# Patient Record
Sex: Female | Born: 1950
Health system: Southern US, Community
[De-identification: ages and names within clinical notes are randomized; demographics above are authoritative.]

## PROBLEM LIST (undated history)

## (undated) DIAGNOSIS — R51 Headache: Secondary | ICD-10-CM

## (undated) DIAGNOSIS — D126 Benign neoplasm of colon, unspecified: Secondary | ICD-10-CM

## (undated) DIAGNOSIS — H8120 Vestibular neuronitis, unspecified ear: Secondary | ICD-10-CM

## (undated) DIAGNOSIS — I10 Essential (primary) hypertension: Secondary | ICD-10-CM

## (undated) DIAGNOSIS — M1990 Unspecified osteoarthritis, unspecified site: Secondary | ICD-10-CM

## (undated) DIAGNOSIS — N186 End stage renal disease: Secondary | ICD-10-CM

## (undated) DIAGNOSIS — J189 Pneumonia, unspecified organism: Secondary | ICD-10-CM

## (undated) DIAGNOSIS — Z992 Dependence on renal dialysis: Secondary | ICD-10-CM

## (undated) DIAGNOSIS — T82898A Other specified complication of vascular prosthetic devices, implants and grafts, initial encounter: Secondary | ICD-10-CM

## (undated) DIAGNOSIS — I639 Cerebral infarction, unspecified: Secondary | ICD-10-CM

## (undated) DIAGNOSIS — I2510 Atherosclerotic heart disease of native coronary artery without angina pectoris: Secondary | ICD-10-CM

## (undated) DIAGNOSIS — K5900 Constipation, unspecified: Secondary | ICD-10-CM

## (undated) DIAGNOSIS — F418 Other specified anxiety disorders: Secondary | ICD-10-CM

## (undated) DIAGNOSIS — G709 Myoneural disorder, unspecified: Secondary | ICD-10-CM

## (undated) DIAGNOSIS — R269 Unspecified abnormalities of gait and mobility: Secondary | ICD-10-CM

## (undated) DIAGNOSIS — D649 Anemia, unspecified: Secondary | ICD-10-CM

## (undated) DIAGNOSIS — R2990 Unspecified symptoms and signs involving the nervous system: Secondary | ICD-10-CM

## (undated) DIAGNOSIS — R519 Headache, unspecified: Secondary | ICD-10-CM

## (undated) DIAGNOSIS — E785 Hyperlipidemia, unspecified: Secondary | ICD-10-CM

## (undated) DIAGNOSIS — I5033 Acute on chronic diastolic (congestive) heart failure: Secondary | ICD-10-CM

## (undated) DIAGNOSIS — I509 Heart failure, unspecified: Secondary | ICD-10-CM

## (undated) DIAGNOSIS — T82510A Breakdown (mechanical) of surgically created arteriovenous fistula, initial encounter: Secondary | ICD-10-CM

## (undated) DIAGNOSIS — K922 Gastrointestinal hemorrhage, unspecified: Secondary | ICD-10-CM

## (undated) DIAGNOSIS — R634 Abnormal weight loss: Secondary | ICD-10-CM

## (undated) DIAGNOSIS — E7800 Pure hypercholesterolemia, unspecified: Secondary | ICD-10-CM

## (undated) DIAGNOSIS — K219 Gastro-esophageal reflux disease without esophagitis: Secondary | ICD-10-CM

## (undated) DIAGNOSIS — R42 Dizziness and giddiness: Secondary | ICD-10-CM

## (undated) DIAGNOSIS — K5730 Diverticulosis of large intestine without perforation or abscess without bleeding: Secondary | ICD-10-CM

## (undated) DIAGNOSIS — M810 Age-related osteoporosis without current pathological fracture: Secondary | ICD-10-CM

## (undated) HISTORY — DX: Gastrointestinal hemorrhage, unspecified: K92.2

## (undated) HISTORY — DX: Unspecified abnormalities of gait and mobility: R26.9

## (undated) HISTORY — DX: Benign neoplasm of colon, unspecified: D12.6

## (undated) HISTORY — DX: Gastro-esophageal reflux disease without esophagitis: K21.9

## (undated) HISTORY — DX: Unspecified symptoms and signs involving the nervous system: R29.90

## (undated) HISTORY — DX: Unspecified osteoarthritis, unspecified site: M19.90

## (undated) HISTORY — DX: Myoneural disorder, unspecified: G70.9

## (undated) HISTORY — DX: Abnormal weight loss: R63.4

## (undated) HISTORY — DX: Dependence on renal dialysis: Z99.2

## (undated) HISTORY — PX: AV FISTULA PLACEMENT: SHX1204

## (undated) HISTORY — DX: Vestibular neuronitis, unspecified ear: H81.20

## (undated) HISTORY — DX: Atherosclerotic heart disease of native coronary artery without angina pectoris: I25.10

## (undated) HISTORY — DX: Anemia, unspecified: D64.9

## (undated) HISTORY — DX: Essential (primary) hypertension: I10

## (undated) HISTORY — DX: Diverticulosis of large intestine without perforation or abscess without bleeding: K57.30

## (undated) HISTORY — DX: Hyperlipidemia, unspecified: E78.5

## (undated) HISTORY — DX: Pure hypercholesterolemia, unspecified: E78.00

## (undated) HISTORY — PX: EYE SURGERY: SHX253

## (undated) HISTORY — PX: BACK SURGERY: SHX140

## (undated) HISTORY — DX: Dizziness and giddiness: R42

## (undated) HISTORY — DX: Age-related osteoporosis without current pathological fracture: M81.0

## (undated) HISTORY — PX: CERVICAL FUSION: SHX112

---

## 1994-12-14 HISTORY — PX: KIDNEY TRANSPLANT: SHX239

## 1998-05-11 ENCOUNTER — Ambulatory Visit (HOSPITAL_COMMUNITY): Admission: RE | Admit: 1998-05-11 | Discharge: 1998-05-11 | Payer: Self-pay | Admitting: Nephrology

## 1998-05-15 ENCOUNTER — Other Ambulatory Visit: Admission: RE | Admit: 1998-05-15 | Discharge: 1998-05-15 | Payer: Self-pay | Admitting: Nephrology

## 1998-06-06 ENCOUNTER — Other Ambulatory Visit: Admission: RE | Admit: 1998-06-06 | Discharge: 1998-06-06 | Payer: Self-pay | Admitting: *Deleted

## 1998-06-18 ENCOUNTER — Other Ambulatory Visit: Admission: RE | Admit: 1998-06-18 | Discharge: 1998-06-18 | Payer: Self-pay | Admitting: Nephrology

## 1998-08-24 ENCOUNTER — Ambulatory Visit (HOSPITAL_COMMUNITY): Admission: RE | Admit: 1998-08-24 | Discharge: 1998-08-24 | Payer: Self-pay | Admitting: Nephrology

## 1998-12-27 ENCOUNTER — Other Ambulatory Visit: Admission: RE | Admit: 1998-12-27 | Discharge: 1998-12-27 | Payer: Self-pay | Admitting: *Deleted

## 1999-01-31 ENCOUNTER — Other Ambulatory Visit: Admission: RE | Admit: 1999-01-31 | Discharge: 1999-01-31 | Payer: Self-pay | Admitting: *Deleted

## 1999-03-24 ENCOUNTER — Ambulatory Visit (HOSPITAL_COMMUNITY): Admission: RE | Admit: 1999-03-24 | Discharge: 1999-03-24 | Payer: Self-pay | Admitting: Nephrology

## 1999-11-07 ENCOUNTER — Emergency Department (HOSPITAL_COMMUNITY): Admission: EM | Admit: 1999-11-07 | Discharge: 1999-11-07 | Payer: Self-pay | Admitting: Internal Medicine

## 1999-11-07 ENCOUNTER — Encounter: Payer: Self-pay | Admitting: Internal Medicine

## 2000-01-21 ENCOUNTER — Emergency Department (HOSPITAL_COMMUNITY): Admission: EM | Admit: 2000-01-21 | Discharge: 2000-01-21 | Payer: Self-pay | Admitting: Emergency Medicine

## 2000-01-22 ENCOUNTER — Encounter: Payer: Self-pay | Admitting: Emergency Medicine

## 2000-01-29 ENCOUNTER — Other Ambulatory Visit: Admission: RE | Admit: 2000-01-29 | Discharge: 2000-01-29 | Payer: Self-pay | Admitting: *Deleted

## 2000-01-29 ENCOUNTER — Encounter (INDEPENDENT_AMBULATORY_CARE_PROVIDER_SITE_OTHER): Payer: Self-pay

## 2000-08-06 ENCOUNTER — Other Ambulatory Visit: Admission: RE | Admit: 2000-08-06 | Discharge: 2000-08-06 | Payer: Self-pay | Admitting: *Deleted

## 2000-09-24 ENCOUNTER — Inpatient Hospital Stay (HOSPITAL_COMMUNITY): Admission: EM | Admit: 2000-09-24 | Discharge: 2000-09-27 | Payer: Self-pay | Admitting: Emergency Medicine

## 2000-09-24 ENCOUNTER — Encounter: Payer: Self-pay | Admitting: Emergency Medicine

## 2000-10-06 ENCOUNTER — Other Ambulatory Visit: Admission: RE | Admit: 2000-10-06 | Discharge: 2000-10-06 | Payer: Self-pay | Admitting: *Deleted

## 2000-10-06 ENCOUNTER — Encounter (INDEPENDENT_AMBULATORY_CARE_PROVIDER_SITE_OTHER): Payer: Self-pay | Admitting: Specialist

## 2000-10-11 ENCOUNTER — Encounter: Admission: RE | Admit: 2000-10-11 | Discharge: 2000-11-26 | Payer: Self-pay | Admitting: Neurosurgery

## 2000-11-21 ENCOUNTER — Inpatient Hospital Stay (HOSPITAL_COMMUNITY): Admission: EM | Admit: 2000-11-21 | Discharge: 2000-11-25 | Payer: Self-pay | Admitting: Emergency Medicine

## 2000-11-22 ENCOUNTER — Encounter: Payer: Self-pay | Admitting: Nephrology

## 2000-11-22 ENCOUNTER — Encounter: Payer: Self-pay | Admitting: Neurological Surgery

## 2000-12-10 ENCOUNTER — Ambulatory Visit (HOSPITAL_COMMUNITY): Admission: RE | Admit: 2000-12-10 | Discharge: 2000-12-10 | Payer: Self-pay | Admitting: Neurosurgery

## 2000-12-10 ENCOUNTER — Encounter: Payer: Self-pay | Admitting: Neurosurgery

## 2001-02-20 ENCOUNTER — Encounter: Payer: Self-pay | Admitting: Nephrology

## 2001-02-20 ENCOUNTER — Inpatient Hospital Stay (HOSPITAL_COMMUNITY): Admission: EM | Admit: 2001-02-20 | Discharge: 2001-02-26 | Payer: Self-pay | Admitting: Emergency Medicine

## 2001-02-21 ENCOUNTER — Encounter: Payer: Self-pay | Admitting: Nephrology

## 2001-03-08 ENCOUNTER — Other Ambulatory Visit: Admission: RE | Admit: 2001-03-08 | Discharge: 2001-03-08 | Payer: Self-pay | Admitting: *Deleted

## 2001-03-30 ENCOUNTER — Encounter: Admission: RE | Admit: 2001-03-30 | Discharge: 2001-03-30 | Payer: Self-pay | Admitting: Neurosurgery

## 2001-03-30 ENCOUNTER — Encounter: Payer: Self-pay | Admitting: Neurosurgery

## 2001-08-09 ENCOUNTER — Encounter: Payer: Self-pay | Admitting: Neurological Surgery

## 2001-08-09 ENCOUNTER — Ambulatory Visit (HOSPITAL_COMMUNITY): Admission: RE | Admit: 2001-08-09 | Discharge: 2001-08-09 | Payer: Self-pay | Admitting: Neurological Surgery

## 2001-08-31 ENCOUNTER — Encounter: Payer: Self-pay | Admitting: Neurological Surgery

## 2001-08-31 ENCOUNTER — Ambulatory Visit (HOSPITAL_COMMUNITY): Admission: RE | Admit: 2001-08-31 | Discharge: 2001-08-31 | Payer: Self-pay | Admitting: Neurological Surgery

## 2001-10-05 ENCOUNTER — Encounter: Payer: Self-pay | Admitting: Family Medicine

## 2001-10-05 ENCOUNTER — Encounter: Admission: RE | Admit: 2001-10-05 | Discharge: 2001-10-05 | Payer: Self-pay | Admitting: Family Medicine

## 2001-10-07 ENCOUNTER — Encounter: Admission: RE | Admit: 2001-10-07 | Discharge: 2002-01-05 | Payer: Self-pay | Admitting: Nephrology

## 2002-02-21 ENCOUNTER — Encounter: Payer: Self-pay | Admitting: Neurological Surgery

## 2002-02-21 ENCOUNTER — Ambulatory Visit (HOSPITAL_COMMUNITY): Admission: RE | Admit: 2002-02-21 | Discharge: 2002-02-21 | Payer: Self-pay | Admitting: Neurological Surgery

## 2002-03-01 ENCOUNTER — Other Ambulatory Visit: Admission: RE | Admit: 2002-03-01 | Discharge: 2002-03-01 | Payer: Self-pay | Admitting: *Deleted

## 2002-03-07 ENCOUNTER — Encounter: Payer: Self-pay | Admitting: Neurological Surgery

## 2002-03-07 ENCOUNTER — Observation Stay (HOSPITAL_COMMUNITY): Admission: RE | Admit: 2002-03-07 | Discharge: 2002-03-08 | Payer: Self-pay | Admitting: Neurological Surgery

## 2002-03-14 ENCOUNTER — Encounter: Admission: RE | Admit: 2002-03-14 | Discharge: 2002-06-12 | Payer: Self-pay | Admitting: Nephrology

## 2002-06-21 ENCOUNTER — Other Ambulatory Visit: Admission: RE | Admit: 2002-06-21 | Discharge: 2002-06-21 | Payer: Self-pay | Admitting: Obstetrics and Gynecology

## 2003-04-03 ENCOUNTER — Other Ambulatory Visit: Admission: RE | Admit: 2003-04-03 | Discharge: 2003-04-03 | Payer: Self-pay | Admitting: Gynecology

## 2003-10-11 ENCOUNTER — Other Ambulatory Visit: Admission: RE | Admit: 2003-10-11 | Discharge: 2003-10-11 | Payer: Self-pay | Admitting: Gynecology

## 2004-04-14 ENCOUNTER — Other Ambulatory Visit: Admission: RE | Admit: 2004-04-14 | Discharge: 2004-04-14 | Payer: Self-pay | Admitting: Gynecology

## 2004-08-25 ENCOUNTER — Ambulatory Visit (HOSPITAL_COMMUNITY): Admission: RE | Admit: 2004-08-25 | Discharge: 2004-08-25 | Payer: Self-pay | Admitting: Vascular Surgery

## 2004-09-16 ENCOUNTER — Ambulatory Visit (HOSPITAL_COMMUNITY): Admission: RE | Admit: 2004-09-16 | Discharge: 2004-09-16 | Payer: Self-pay | Admitting: Neurological Surgery

## 2004-09-25 ENCOUNTER — Ambulatory Visit (HOSPITAL_COMMUNITY): Admission: RE | Admit: 2004-09-25 | Discharge: 2004-09-25 | Payer: Self-pay | Admitting: Neurological Surgery

## 2004-09-26 ENCOUNTER — Inpatient Hospital Stay (HOSPITAL_COMMUNITY): Admission: EM | Admit: 2004-09-26 | Discharge: 2004-10-02 | Payer: Self-pay | Admitting: Emergency Medicine

## 2004-09-26 ENCOUNTER — Ambulatory Visit: Payer: Self-pay | Admitting: Infectious Diseases

## 2004-11-25 ENCOUNTER — Encounter: Admission: RE | Admit: 2004-11-25 | Discharge: 2004-11-25 | Payer: Self-pay | Admitting: Nephrology

## 2005-04-03 IMAGING — CR DG CHEST 2V
2 series · 2 of 2 positions shown · non-contrast
Comparison: none

CLINICAL DATA: Preop respiratory exam for AV fistula placement. 
 PA AND LATERAL CHEST 
 No comparison.
 There is mild cardiac enlargement.  The aorta is tortuous.  The lungs are clear.  There is no pleural effusion or pneumothorax.  Osseous structures appear unremarkable. 
 IMPRESSION
 Mild cardiac enlargement.  No acute chest findings.

[view not recorded (1 of 2)]
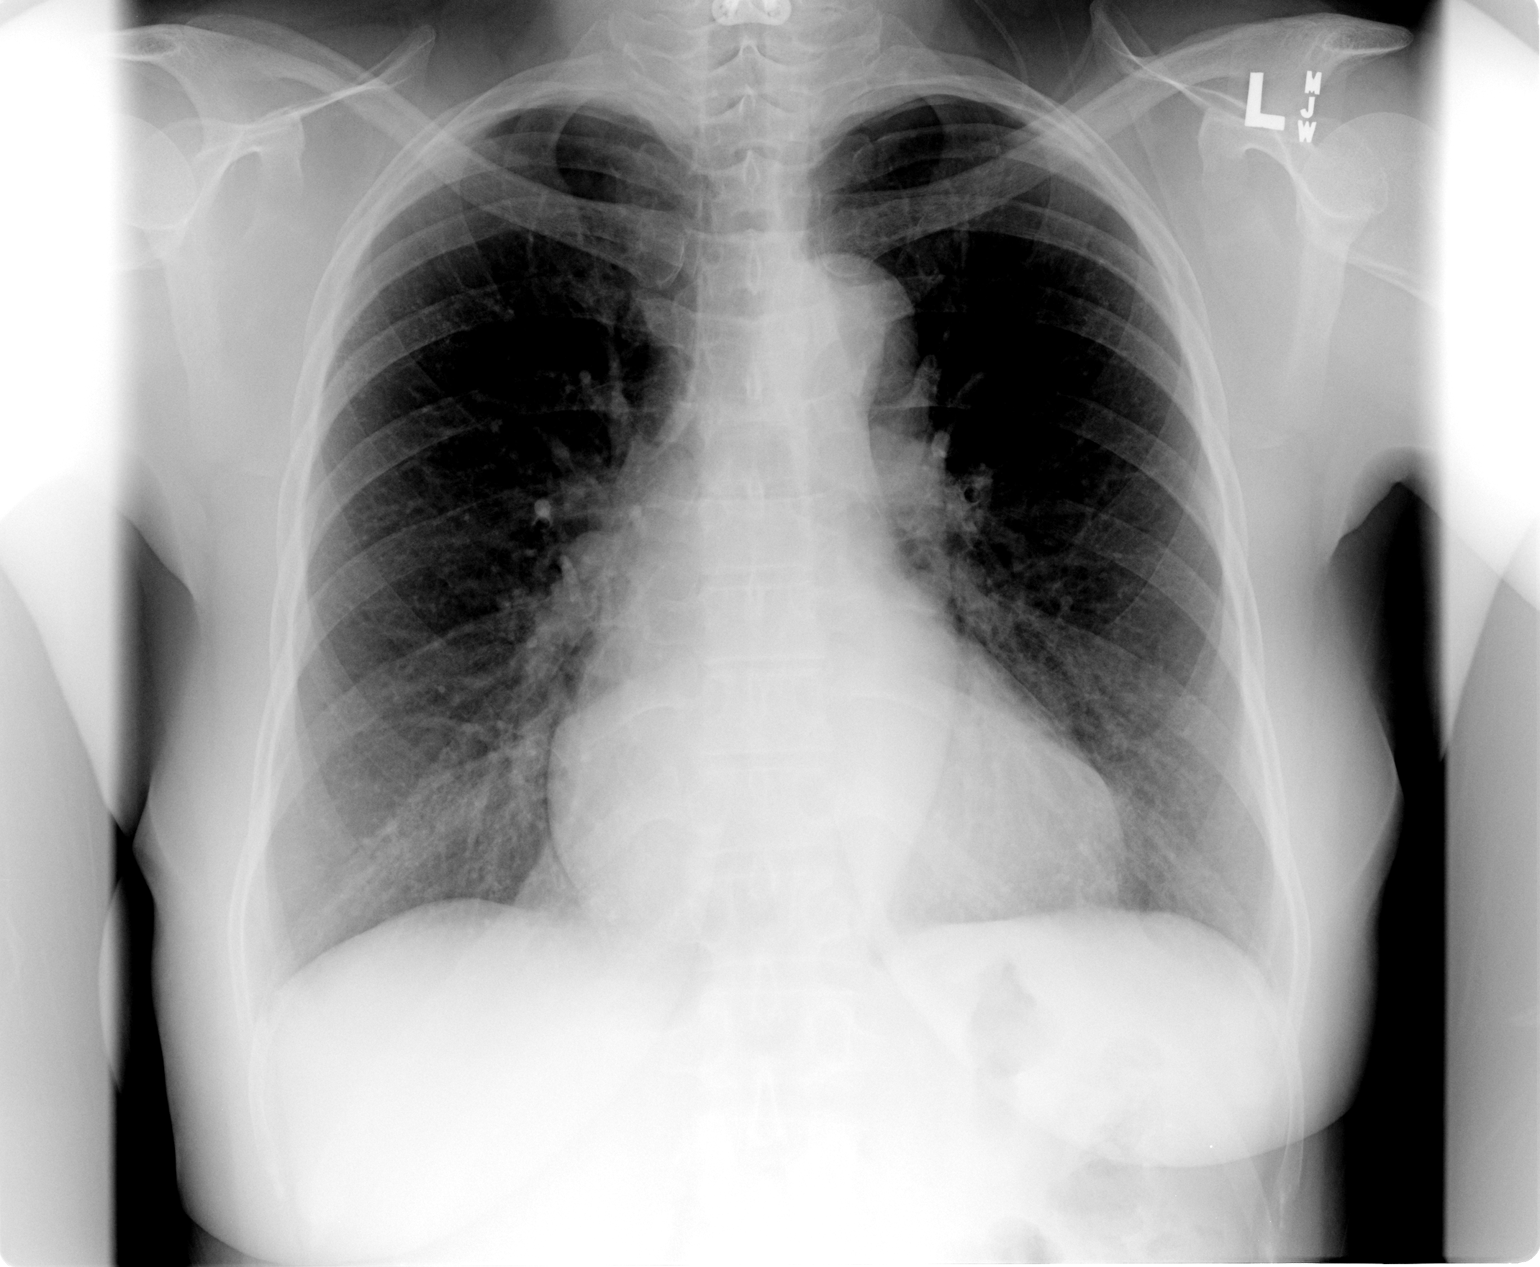

[view not recorded (2 of 2)]
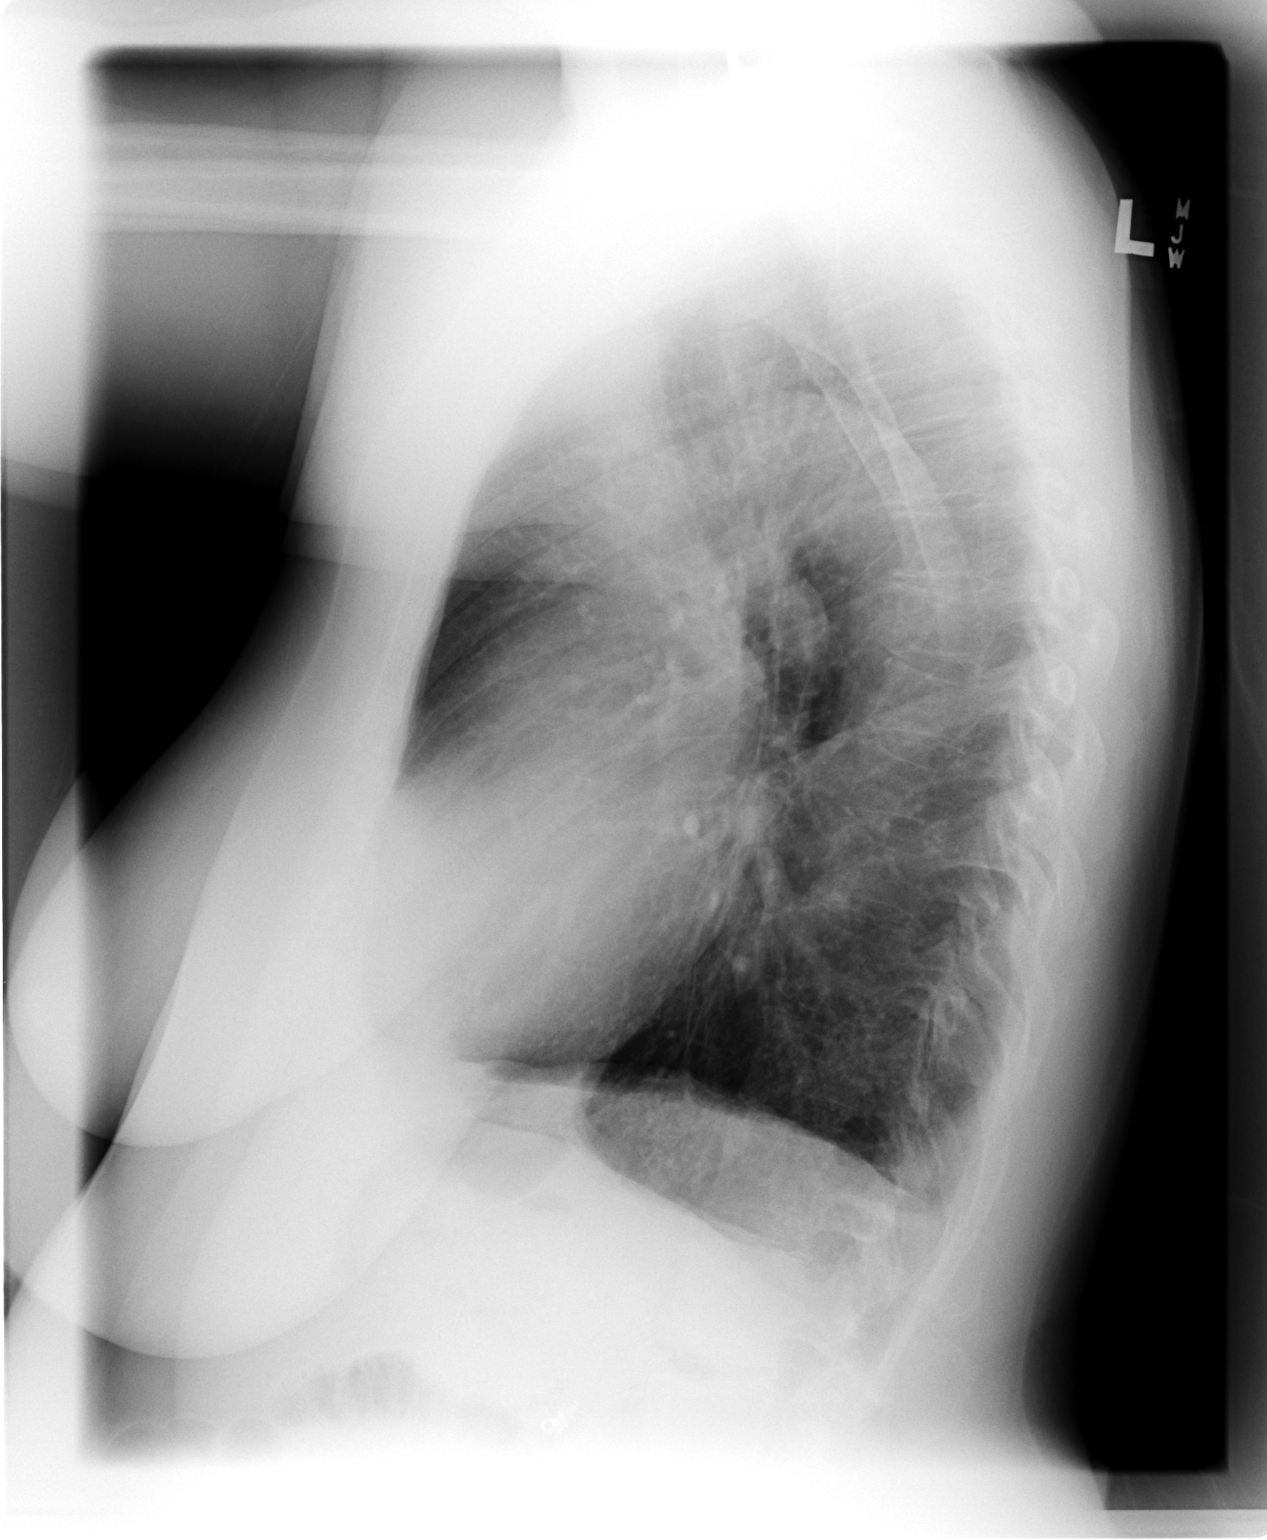

[2 of 2 positions shown; findings below may reference images not displayed]

## 2005-04-25 IMAGING — CT CT L SPINE W/ CM
1 of 15 series · 1 of 15 positions shown, 2 images · IV contrast (omnipaque)
Comparison: none

CLINICAL DATA: Back pain. 
LUMBAR MYELOGRAM 
Lumbar puncture was performed by Dr. Era at L2-3 and an appropriate amount of Omnipaque 180 was instilled into the subarachnoid space.  AP, lateral and oblique views demonstrate severe waist-like narrowing at L4-5.  This appears to be due to a combination of slip, disc material, and posterior element hypertrophy.  There is significant motion at L4-5 in both extension and flexion relative to neutral.
IMPRESSION
Severe multifactorial spinal stenosis at L4-5. 
POST-MYELOGRAM CT  
Individual disc spaces are examined with axial images as follows:
L1-2:  Conus low ending at midbody of L-2.  
L2-3:  Calcified central protrusion.  Small subdural fluid collection on the left related to the lumbar puncture.  
L3-4:  Small subdural fluid collection continues down along the left of the spine.  No disc pathology or stenosis. 
L4-5:  Severe multifactorial spinal stenosis secondary to marked facet arthropathy, degenerative slip of 4 to 5 mm, and central protrusion; bilateral L-4 and bilateral L-5 nerve root encroachment is present along with compression of the thecal sac. 
L5-S1:  No stenosis or disc protrusion.  Vascular calcification incidentally noted. 
Severe multifactorial spinal stenosis at L4-5 secondary to advanced posterior element hypertrophy, degenerative slip, and central protrusion with bilateral L-4 and bilateral L-5 nerve root encroachment.
CT MULTIPLANAR RECONSTRUCTION OF THE LUMBAR SPINE 
Multiplanar reformatted CT images were reconstructed from the axial CT data set. These images were reviewed and pertinent findings are included in the accompanying complete CT report. 
See complete CT report.

[Series 3: l-spine helical · axial · 0.27mm/px · z∈[-363,-363]mm · 1 of 72 slices shown, 2 images]
[im 1/72  soft-tissue]
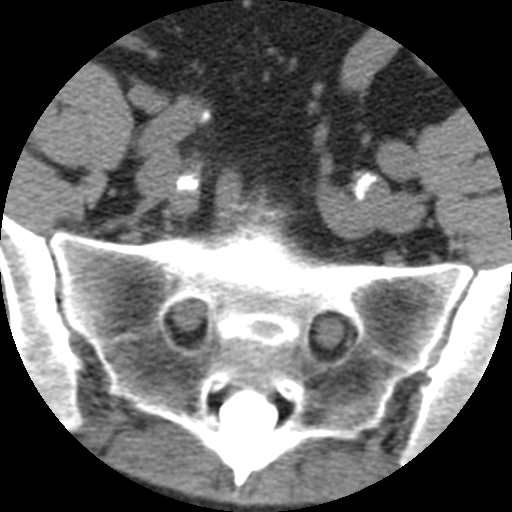
[im 1/72  bone]
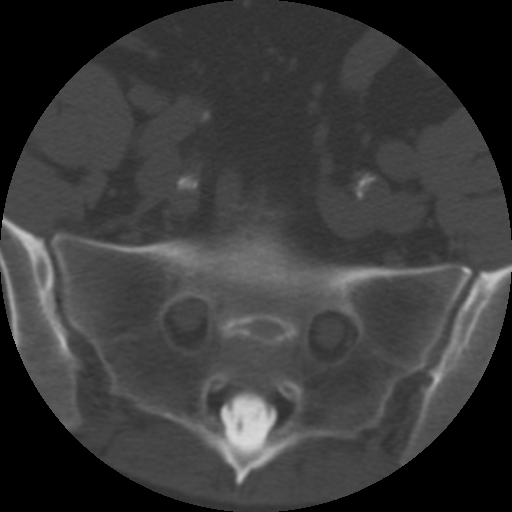

[1 of 15 positions shown; findings below may reference images not displayed]

## 2005-05-05 IMAGING — CR DG CHEST 2V
2 series · 2 of 2 positions shown · non-contrast
Comparison: 08/25/04.

CLINICAL DATA: Dehydration, fever, abdominal pain. 
 TWO VIEW CHEST ? 09/26/04

[view not recorded (1 of 2)]
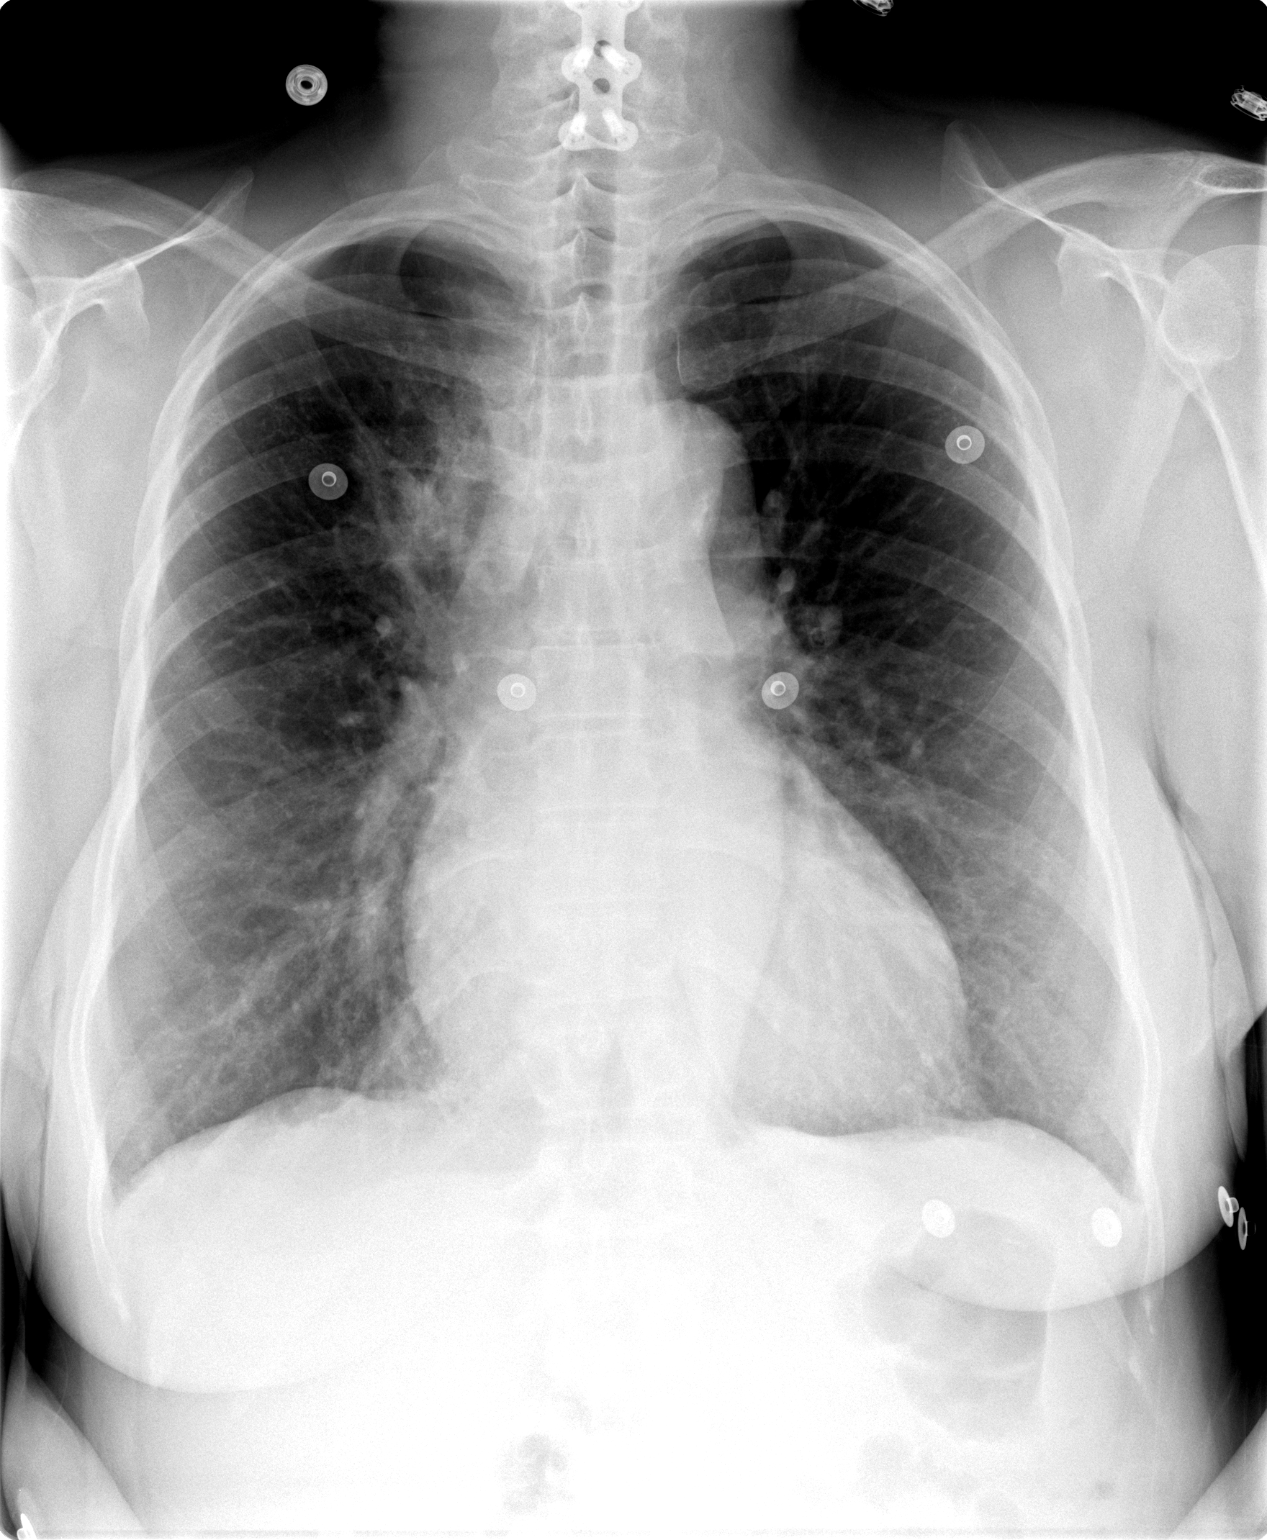

[view not recorded (2 of 2)]
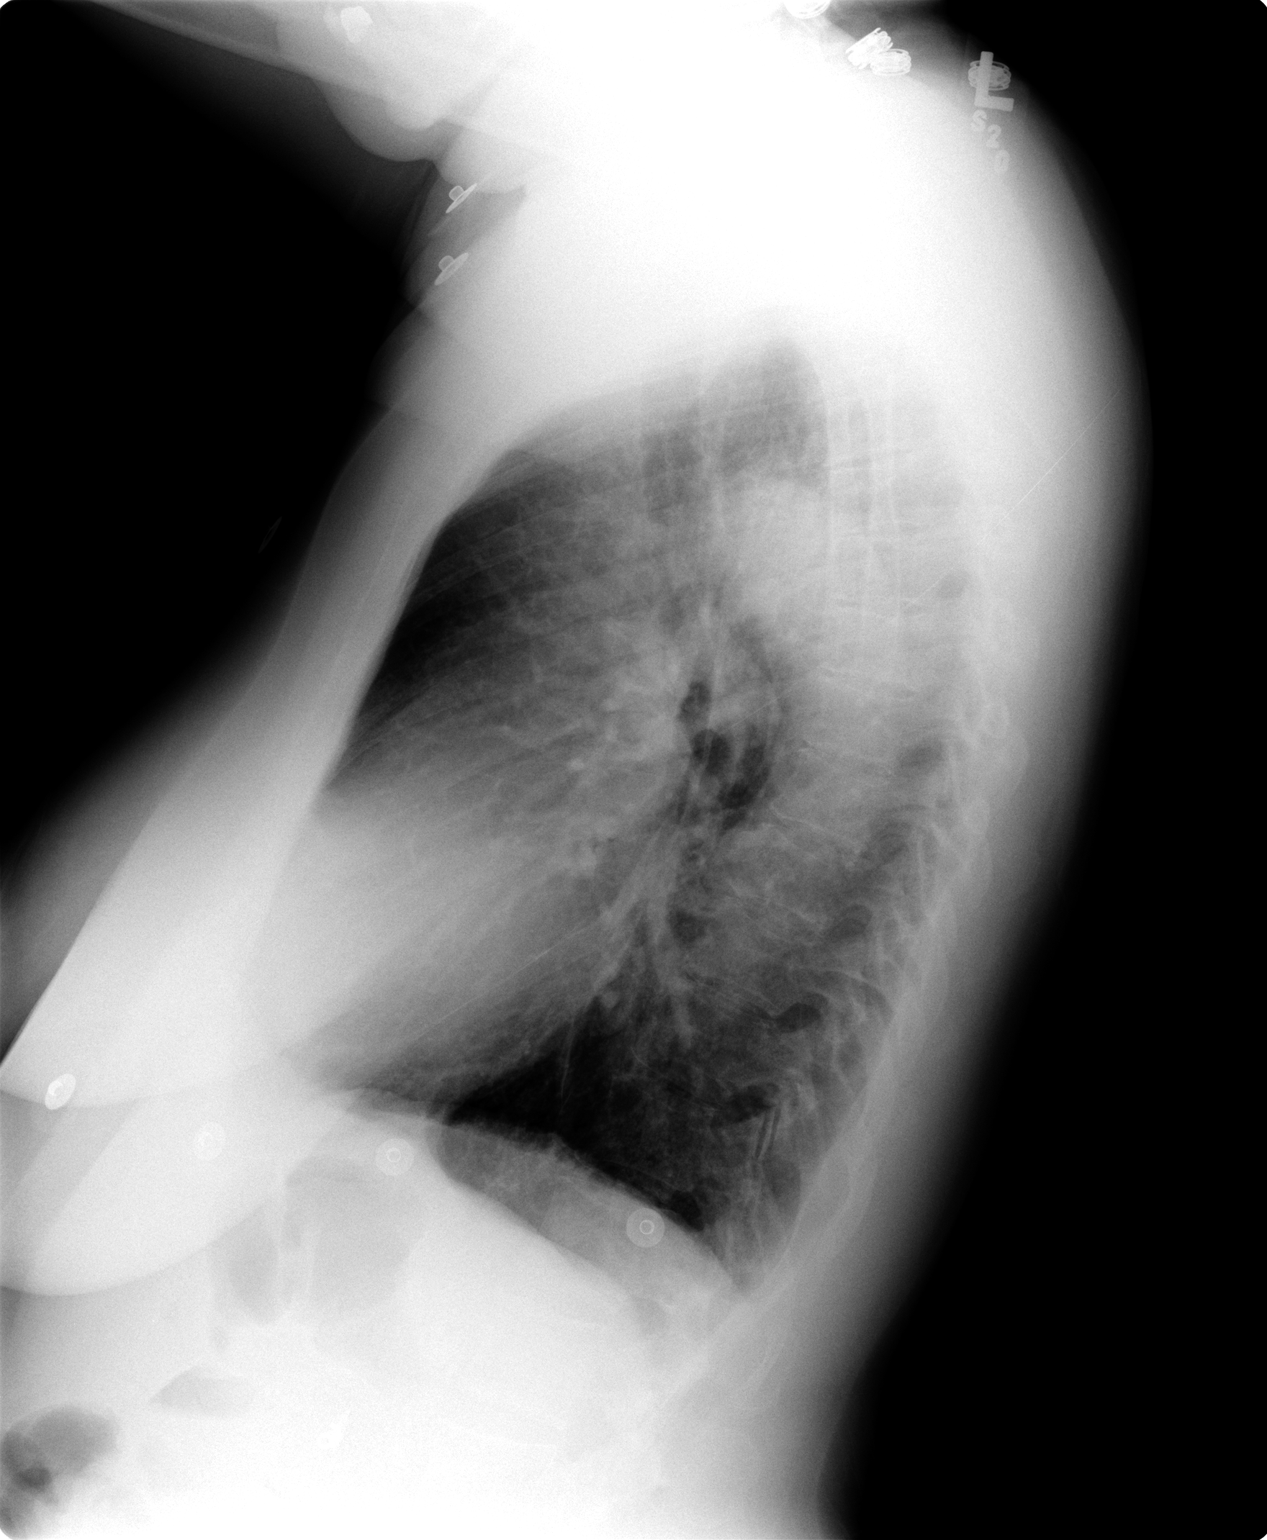

[2 of 2 positions shown; findings below may reference images not displayed]

There is cardiomegaly.  Probable mild COPD.  There is mild peribronchial thickening and interstitial prominence without focal air space opacity.  No effusions.  The visualized skeleton unremarkable.  
 IMPRESSION 
 Cardiomegaly. Mild COPD and chronic bronchitic changes.   No active or acute process.

## 2005-05-08 IMAGING — CR DG CHEST 2V
2 series · 2 of 2 positions shown · non-contrast
Comparison: none

CLINICAL DATA: Fever, leukocytosis, transplant patient. 
 TWO VIEW CHEST
 PA and lateral views of the chest dated 09/29/04 are compared with a prior study of 09/26/04.  Since the previous study, there is dense air space density noted in the right upper lobe thought to represent right upper lobe pneumonia.  The markings are prominent diffusely but essentially unchanged. 
 IMPRESSION 
 Right upper lobe pneumonia with other discussion as above.

[view not recorded (1 of 2)]
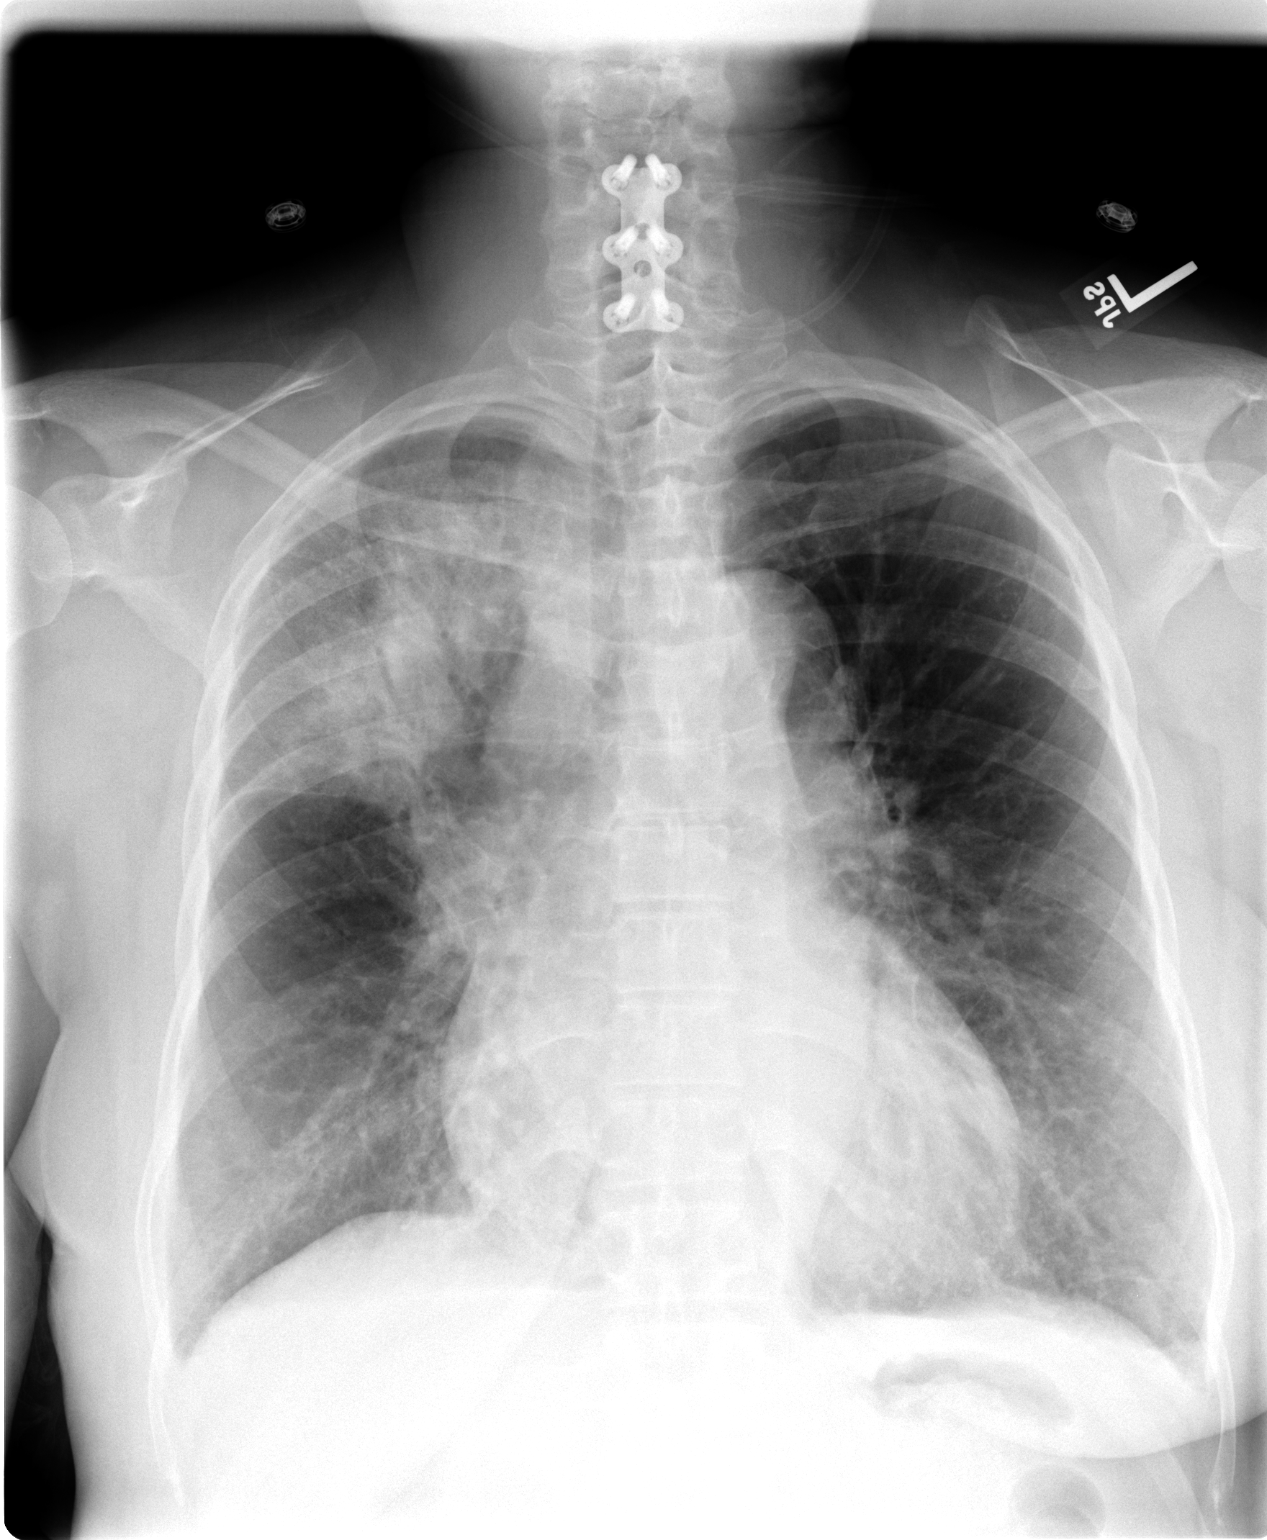

[view not recorded (2 of 2)]
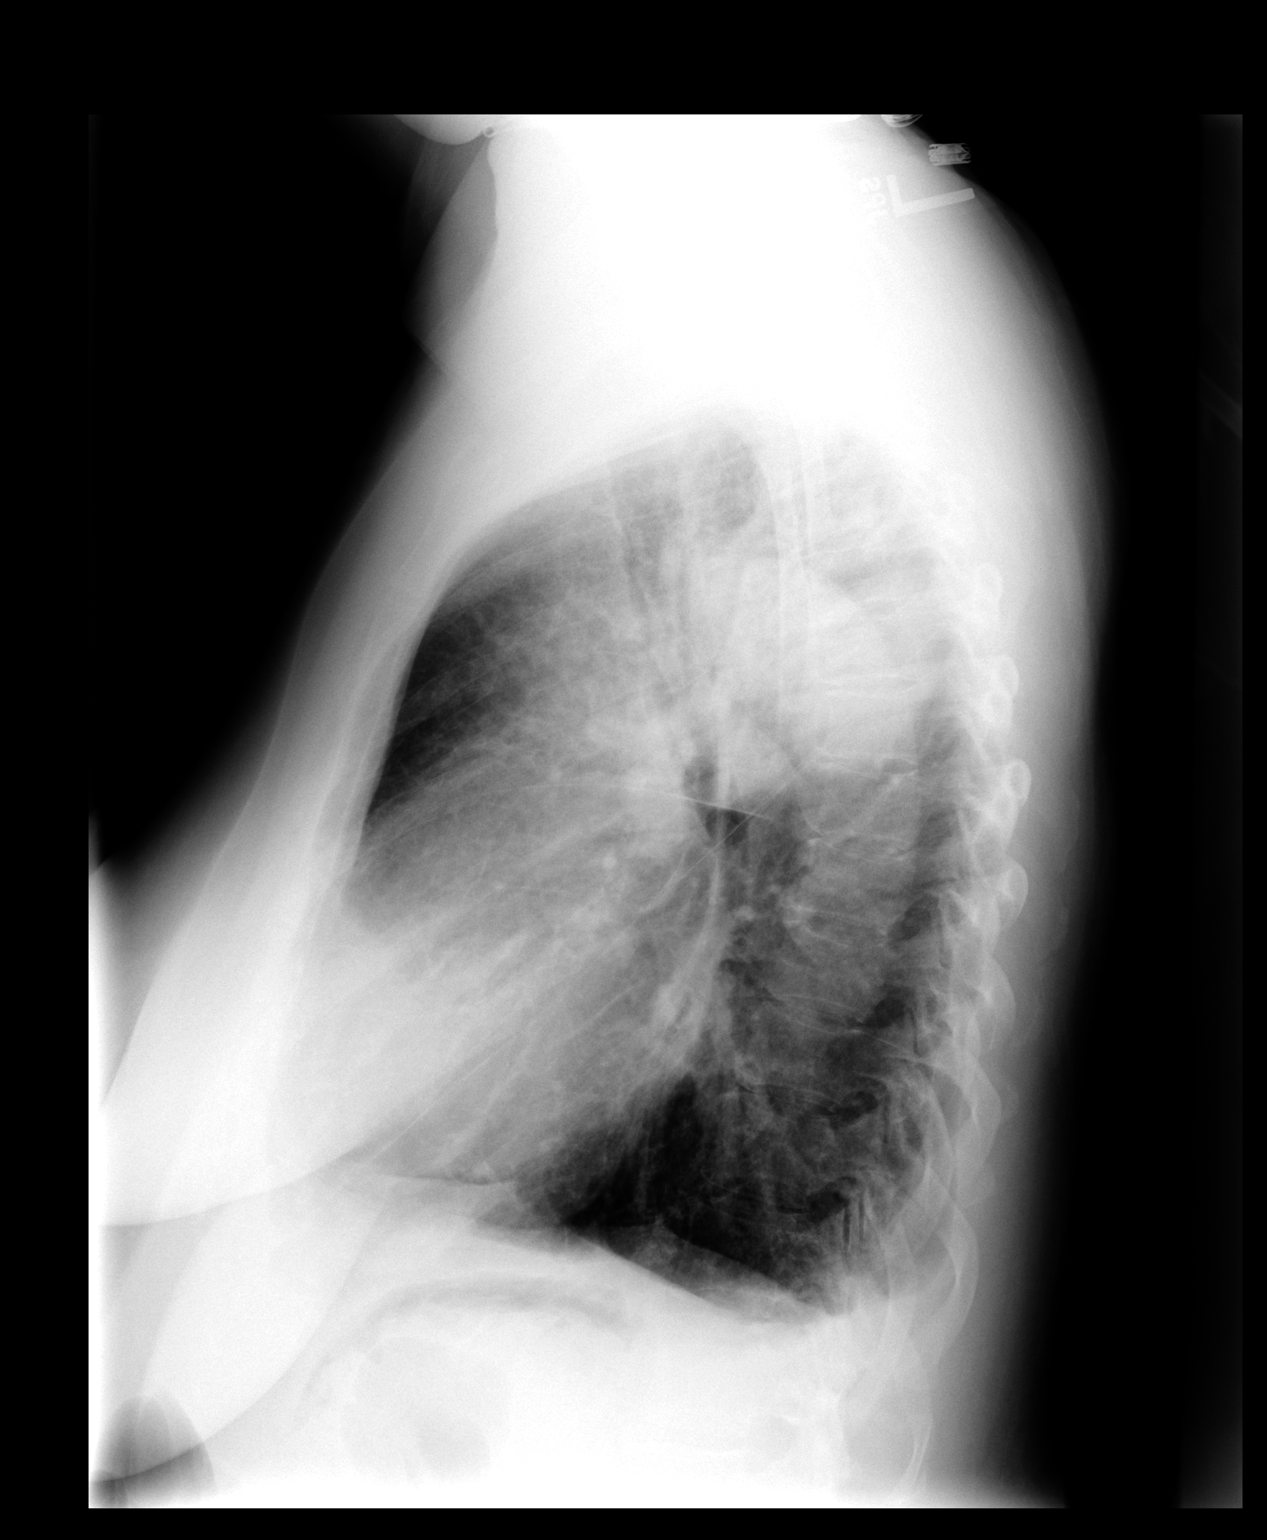

[2 of 2 positions shown; findings below may reference images not displayed]

## 2005-05-10 IMAGING — CR DG CHEST 2V
2 series · 2 of 2 positions shown · non-contrast
Comparison: none

CLINICAL DATA: Dyspnea.  Cough.  
 TWO VIEW CHEST
 Comparison TO 09/29/04.  
 Decreasing airspace disease/pneumonia in the right upper lobe.  Cardiomegaly is again noted as well as mild interstitial prominence.  Small effusions are noted. 
 IMPRESSION
 Decreasing right upper lobe airspace disease/pneumonia.

[view not recorded (1 of 2)]
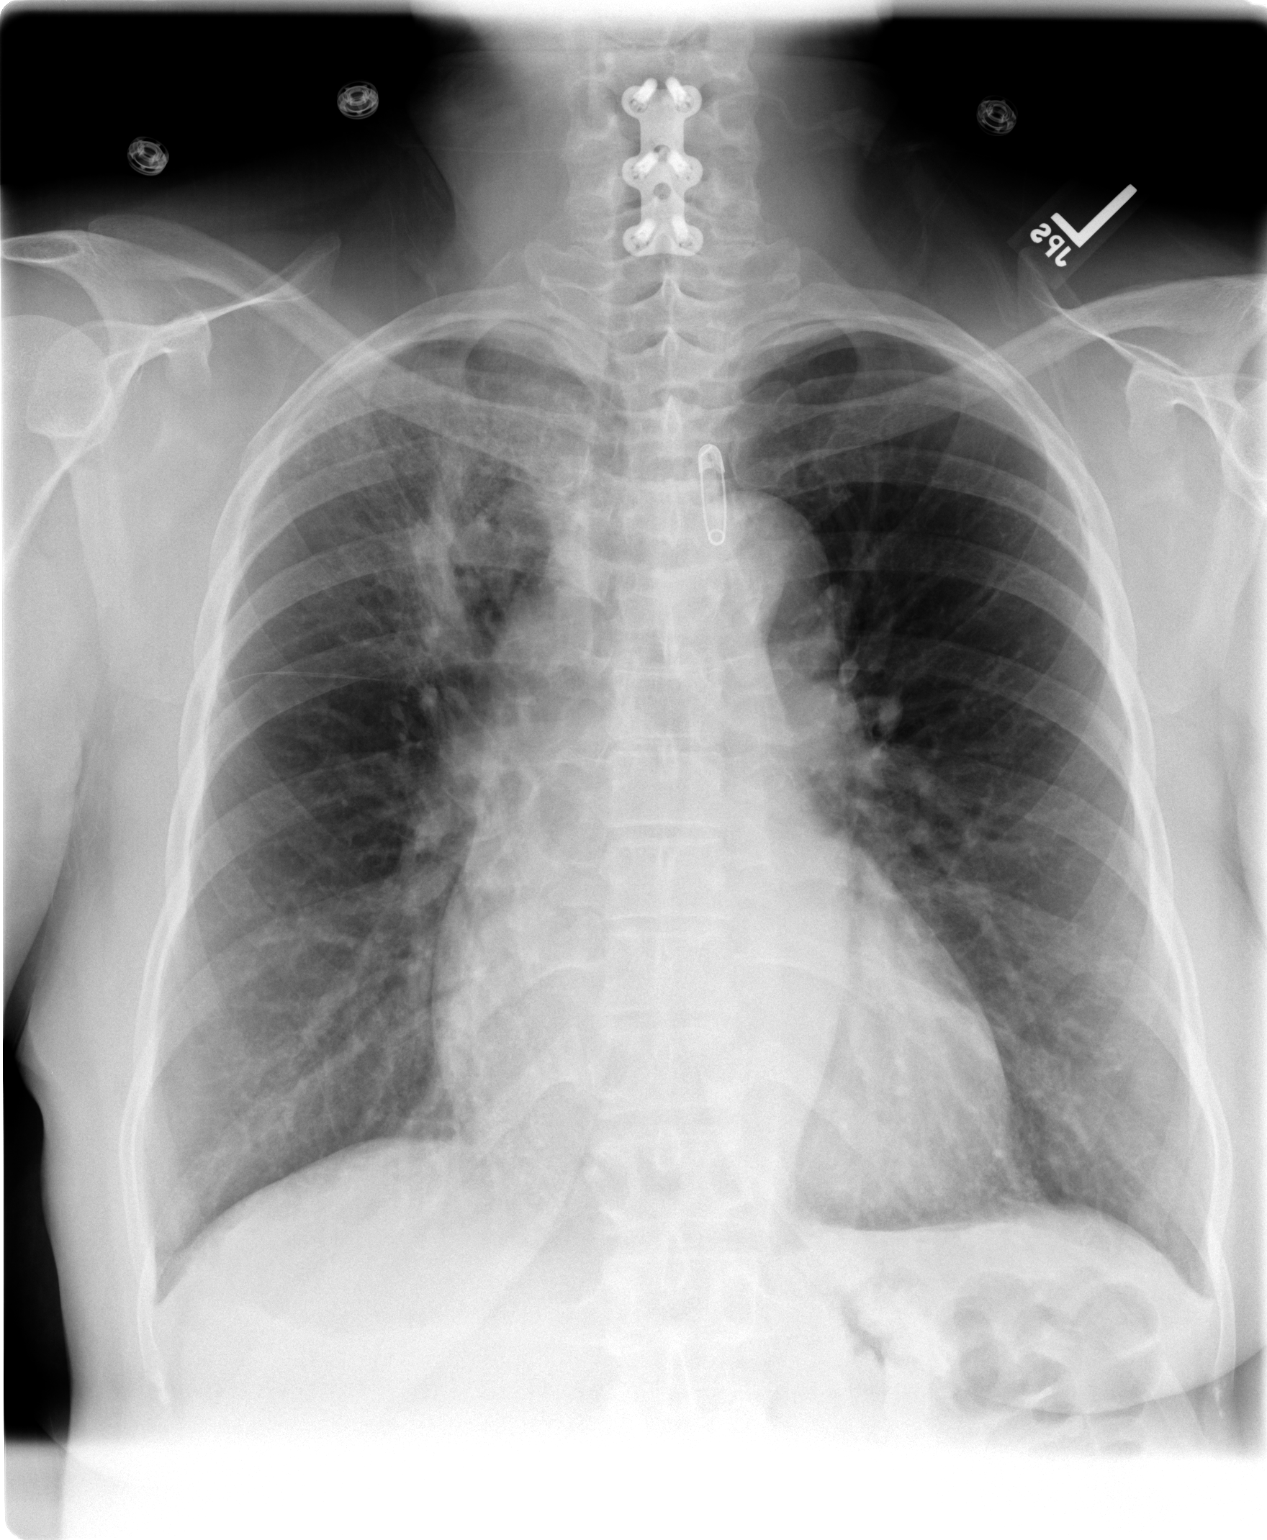

[view not recorded (2 of 2)]
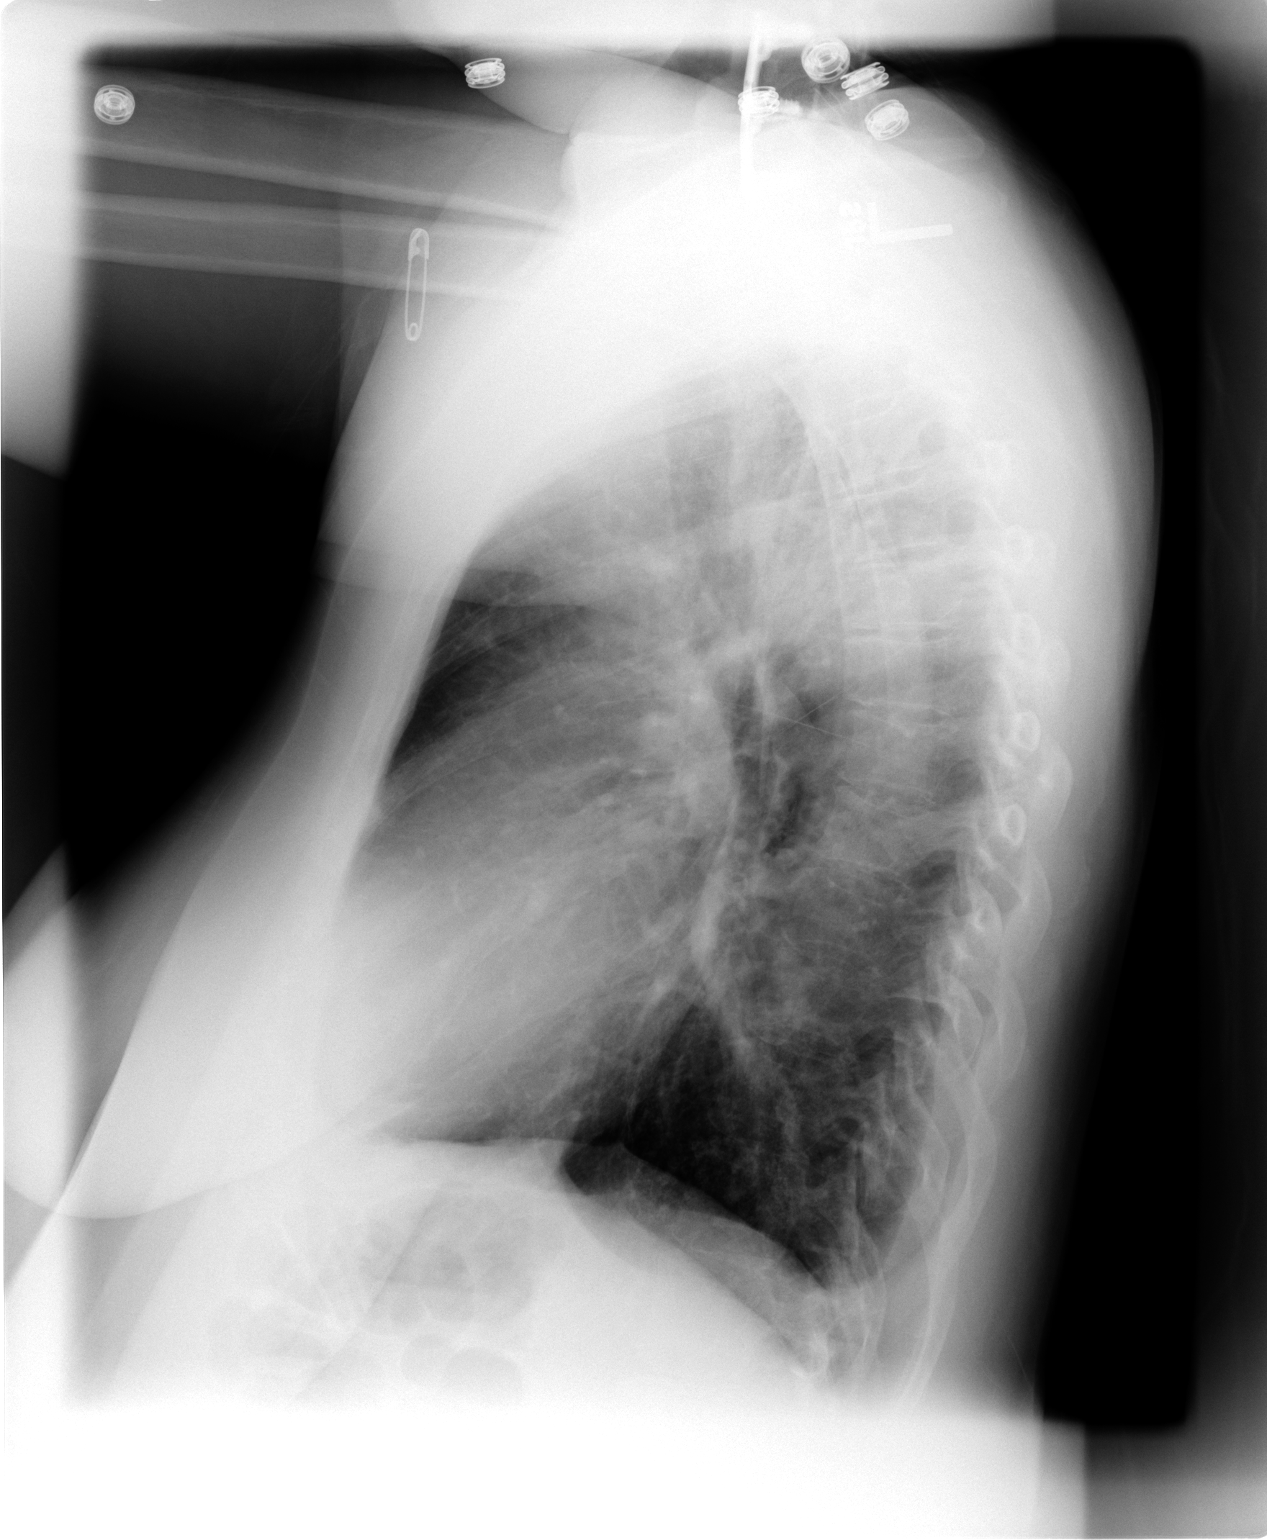

[2 of 2 positions shown; findings below may reference images not displayed]

## 2005-05-19 ENCOUNTER — Other Ambulatory Visit: Admission: RE | Admit: 2005-05-19 | Discharge: 2005-05-19 | Payer: Self-pay | Admitting: Gynecology

## 2005-06-23 ENCOUNTER — Ambulatory Visit: Payer: Self-pay | Admitting: Internal Medicine

## 2005-06-25 ENCOUNTER — Ambulatory Visit: Payer: Self-pay | Admitting: Internal Medicine

## 2005-07-03 ENCOUNTER — Encounter (INDEPENDENT_AMBULATORY_CARE_PROVIDER_SITE_OTHER): Payer: Self-pay | Admitting: *Deleted

## 2005-07-03 ENCOUNTER — Ambulatory Visit: Payer: Self-pay | Admitting: Internal Medicine

## 2005-08-12 ENCOUNTER — Ambulatory Visit (HOSPITAL_COMMUNITY): Admission: RE | Admit: 2005-08-12 | Discharge: 2005-08-12 | Payer: Self-pay | Admitting: Neurological Surgery

## 2005-08-20 ENCOUNTER — Inpatient Hospital Stay (HOSPITAL_COMMUNITY): Admission: RE | Admit: 2005-08-20 | Discharge: 2005-08-23 | Payer: Self-pay | Admitting: Neurological Surgery

## 2005-10-22 ENCOUNTER — Ambulatory Visit (HOSPITAL_COMMUNITY): Admission: RE | Admit: 2005-10-22 | Discharge: 2005-10-22 | Payer: Self-pay | Admitting: Nephrology

## 2005-12-08 ENCOUNTER — Ambulatory Visit (HOSPITAL_COMMUNITY): Admission: RE | Admit: 2005-12-08 | Discharge: 2005-12-09 | Payer: Self-pay | Admitting: Neurological Surgery

## 2006-03-21 IMAGING — MR MR CERVICAL SPINE WO/W CM
4 of 9 series · 19 of 48 positions shown · IV contrast (omniscan)
Comparison: none

CLINICAL DATA: Severe pain in the neck and numbness in both hands.
MRI CERVICAL SPINE WITHOUT AND WITH CONTRAST:
TECHNIQUE: Multiplanar and multiecho pulse sequences of the cervical spine, to include the base of the skull and upper thoracic region, were obtained according to standard protocol before and after administration of intravenous contrast.
Contrast:  20 ml Omniscan IV.

[Series 3: T2 · sagittal · 3.0mm · 0.43mm/px · 4 of 12 slices shown (1 of 2)]
[im 1/12]
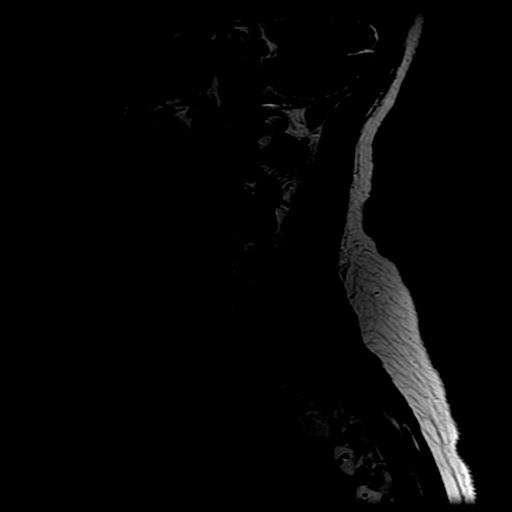
[im 4/12]
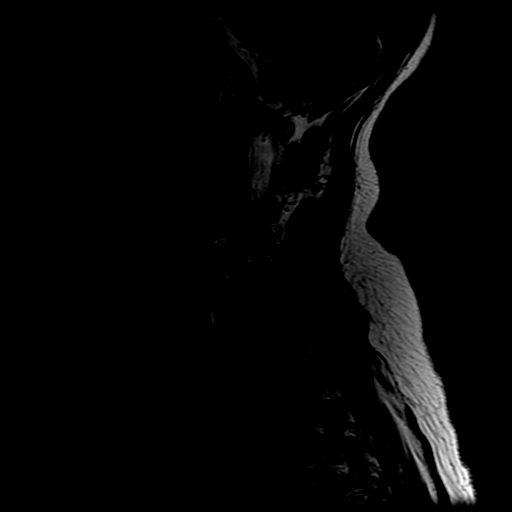
[im 8/12]
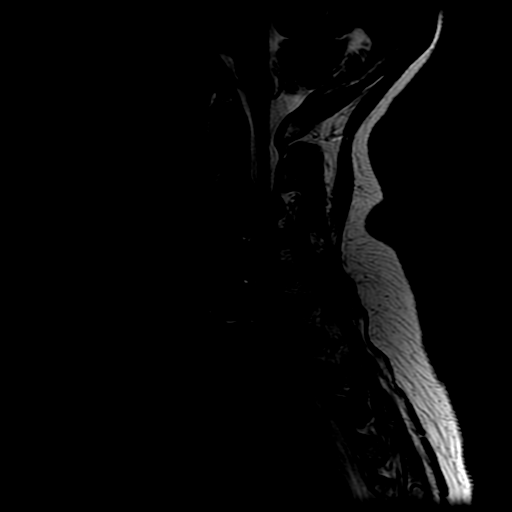
[im 12/12]
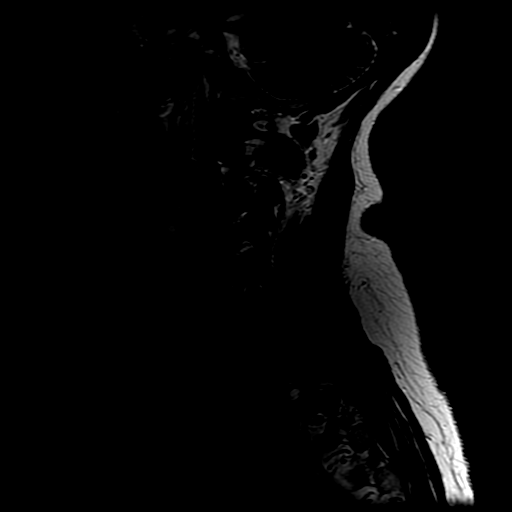

[Series 5: STIR · sagittal · 3.0mm · 0.43mm/px · 4 of 12 slices shown]
[im 1/12]
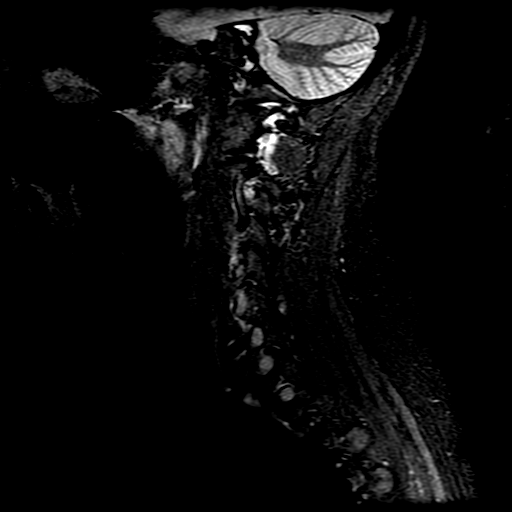
[im 4/12]
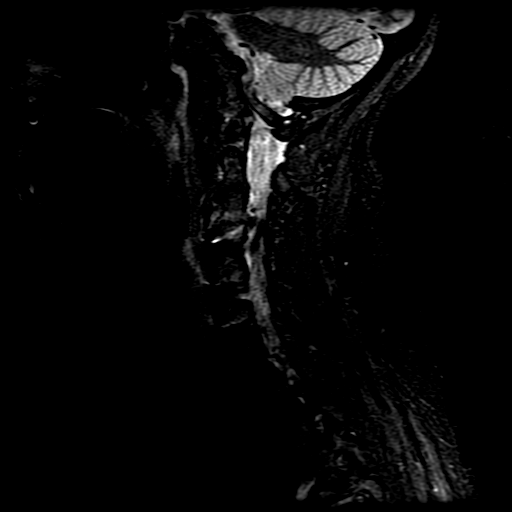
[im 8/12]
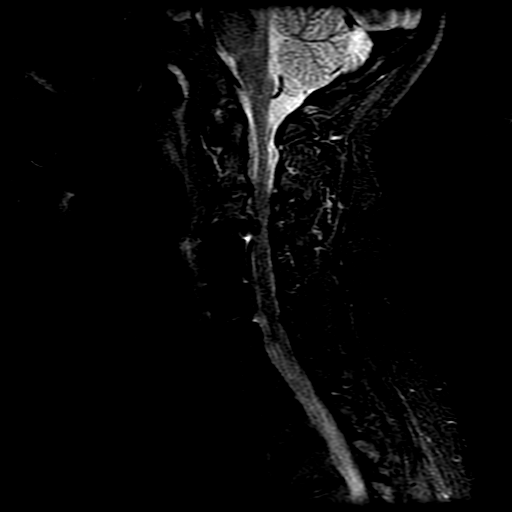
[im 12/12]
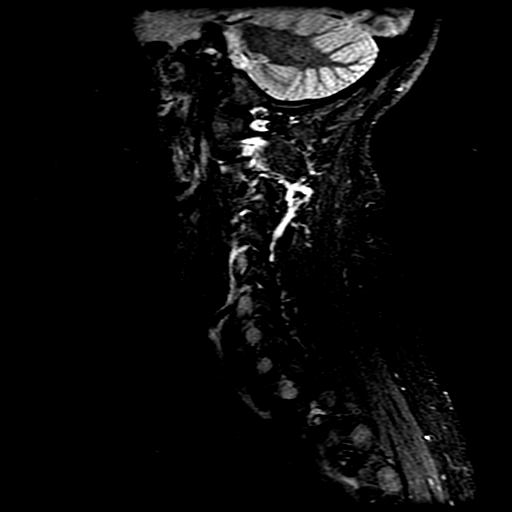

[Series 7: T2 · axial · 4.0mm · 0.39mm/px · z∈[-52,+46]mm · 8 of 23 slices shown (2 of 2)]
[im 1/23]
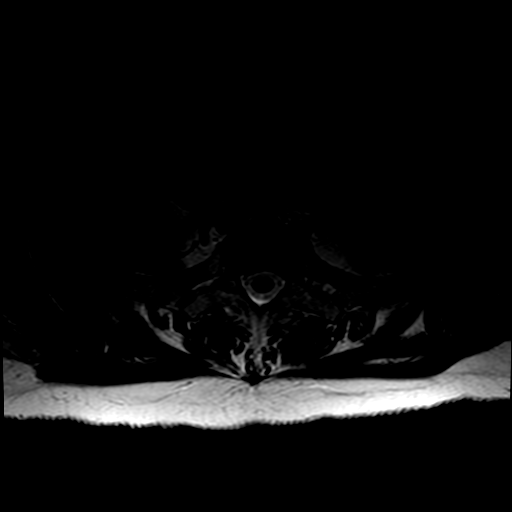
[im 4/23]
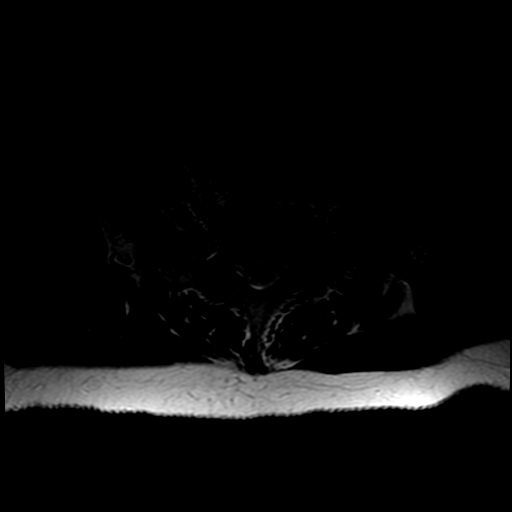
[im 7/23]
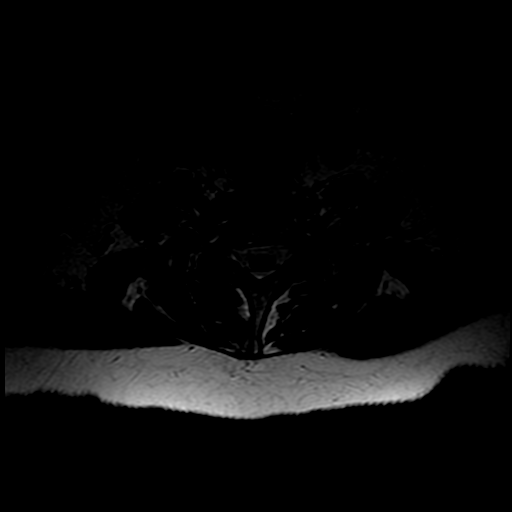
[im 10/23]
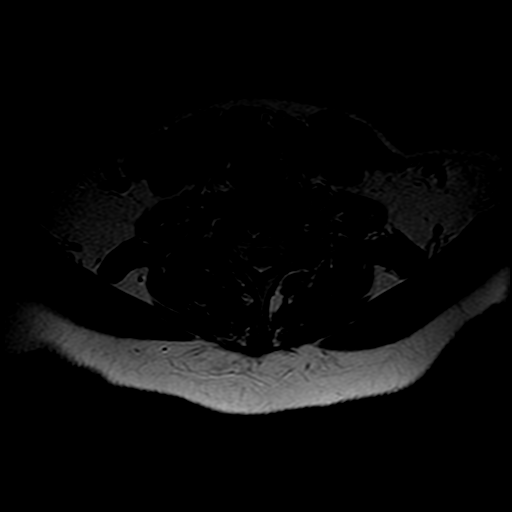
[im 13/23]
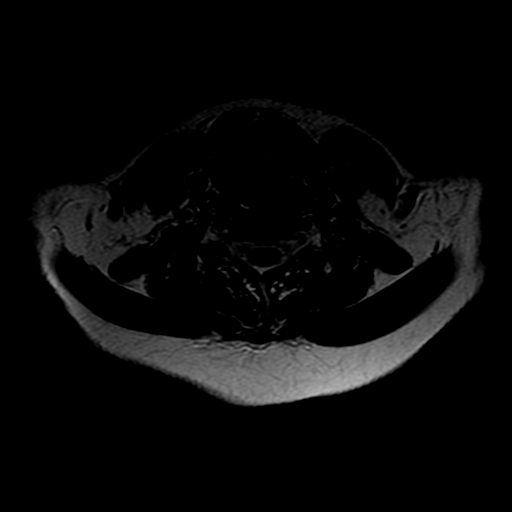
[im 16/23]
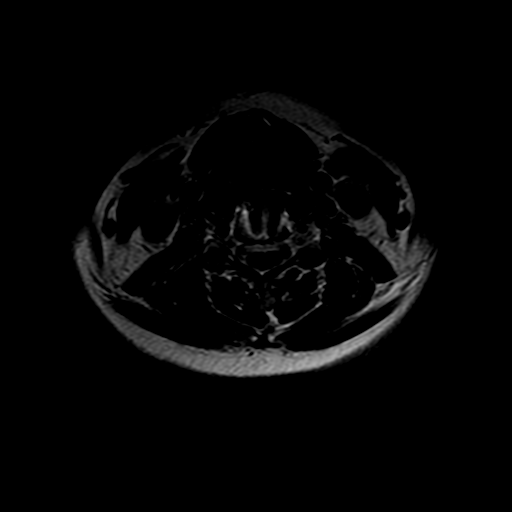
[im 19/23]
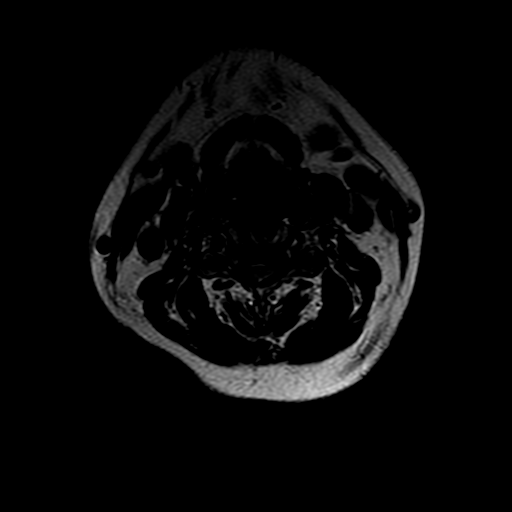
[im 23/23]
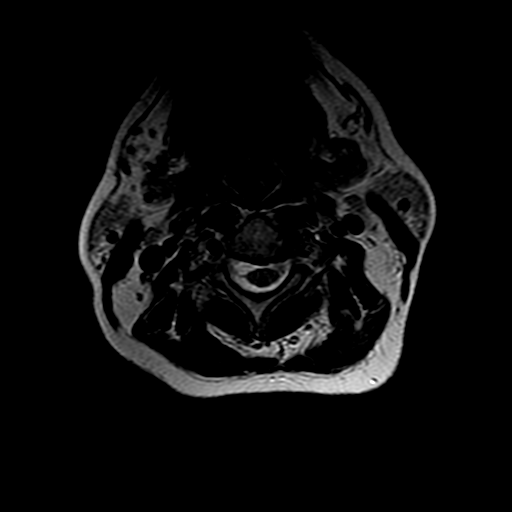

[Series 9: T1 post-contrast · axial · 4.0mm · 0.39mm/px · z∈[-35,+31]mm · 3 of 23 slices shown]
[im 4/23]
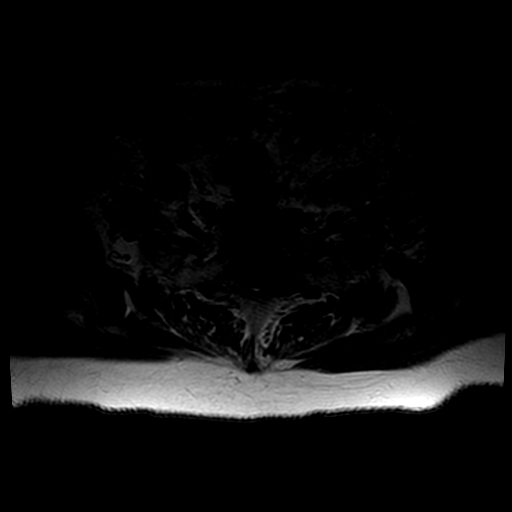
[im 13/23]
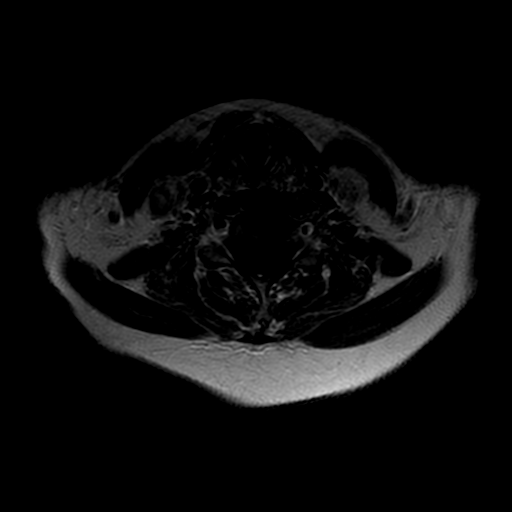
[im 19/23]
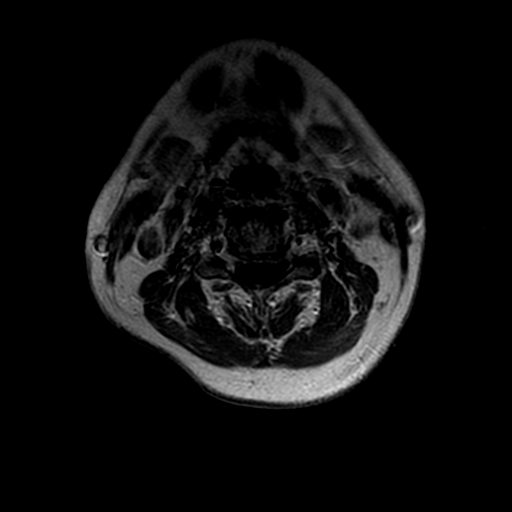

[19 of 48 positions shown; findings below may reference images not displayed]

FINDINGS: Previous C4 through C6 fusion with hardware.  Hypointense band extending through the C5-6 interspace could indicate nonunion.  Recommend plain films, including flexion and extension, for further evaluation.
A large disk extrusion is present at C3-4.  There is significant cord compression along with abnormal cord signal.  The canal diameter measures between 5 and 6 mm.  Bilateral C4 nerve root encroachment is also present.
At C2-3, there is a small central protrusion.  This does not contribute to neural encroachment or stenosis.
At C6-7, there is mild annular bulging but no frank disk protrusion.
The C7-T1 level is normal.
Post-infusion images show no significant abnormal enhancement.
IMPRESSION: 1.  C4-C6 fusion with possible nonunion at the C5-6 interspace.  Recommend plain films.
2.  Large disk extrusion at C3-4 with significant cord compression and abnormal cord signal as well as bilateral C4 nerve root encroachment.
3.  Small protrusion at C2-3 along with mild annular bulging at C5-6 do not result in neural encroachment.

## 2006-06-10 ENCOUNTER — Other Ambulatory Visit: Admission: RE | Admit: 2006-06-10 | Discharge: 2006-06-10 | Payer: Self-pay | Admitting: Gynecology

## 2006-07-13 IMAGING — CR DG CHEST 2V
2 series · 2 of 2 positions shown · non-contrast
Comparison: 11/25/04.

CLINICAL DATA: Cervical spondylolisthesis and radiculopathy.  Pre-op respiratory exam.  History of hypertension and renal failure.
 CHEST ? 2 VIEW:

[view not recorded (1 of 2)]
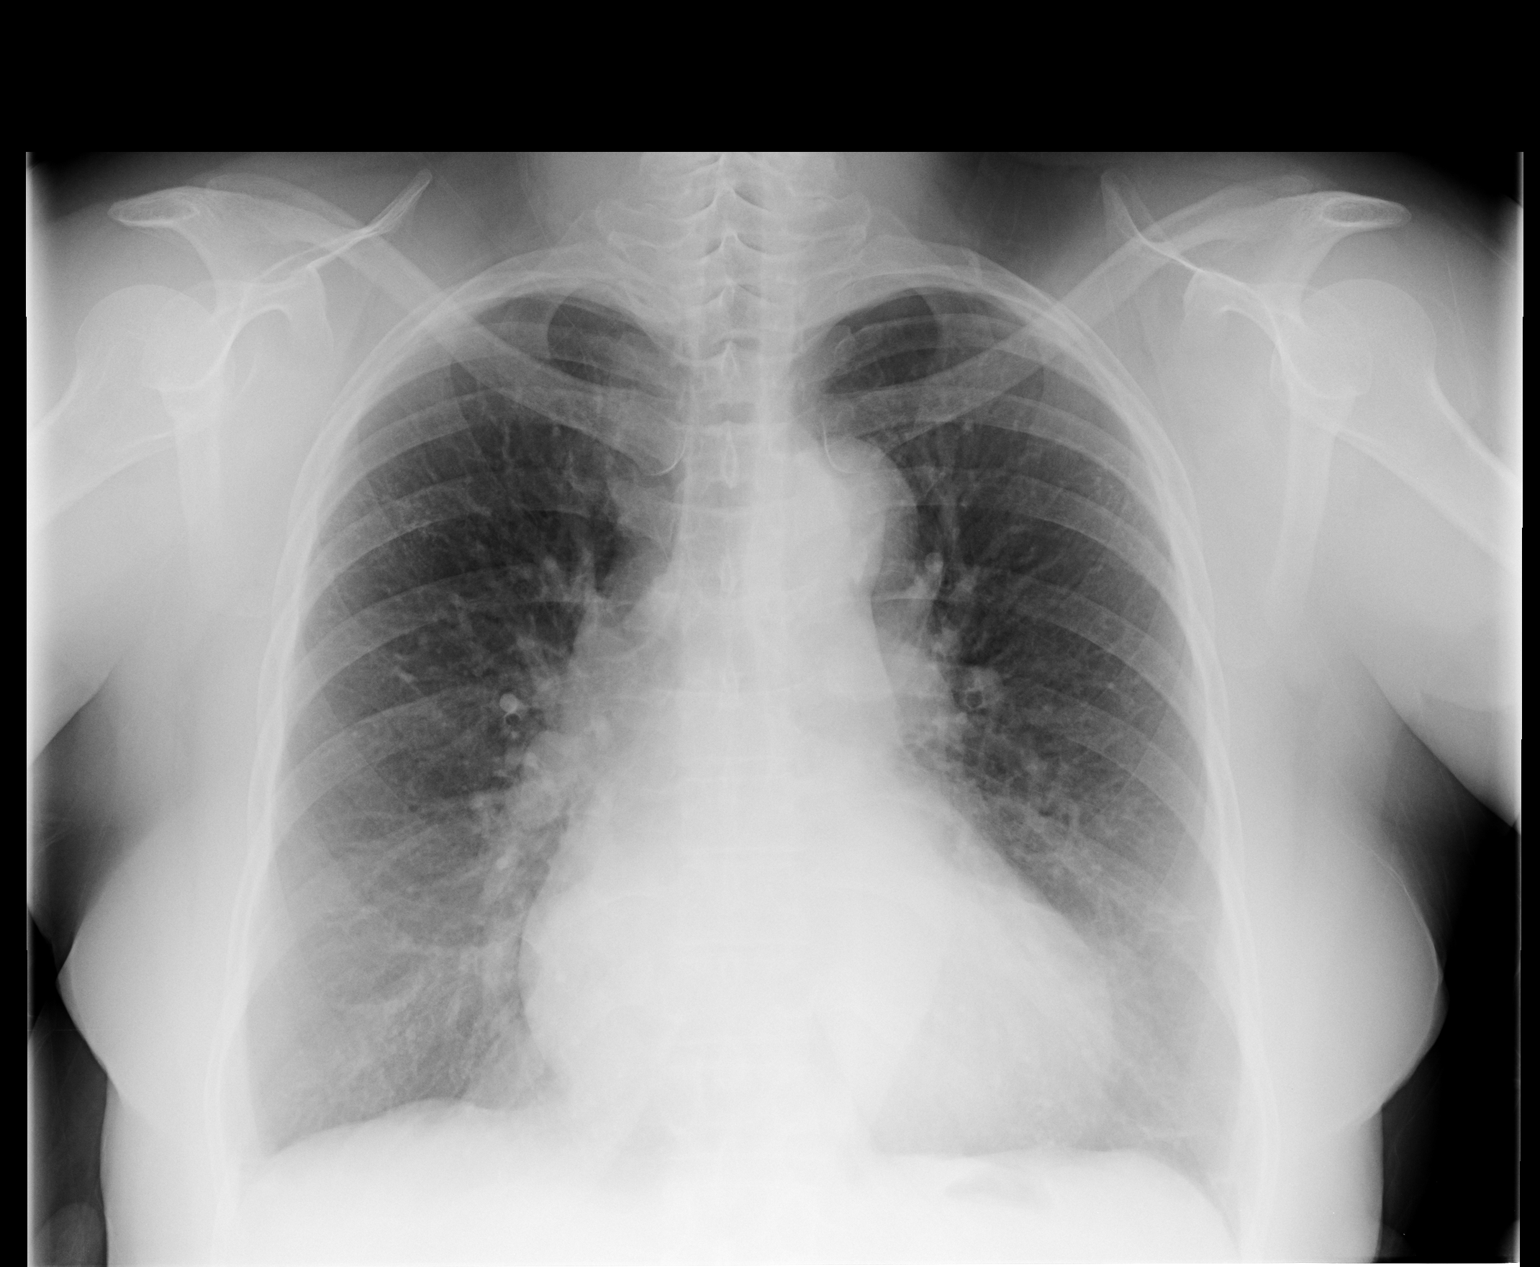

[view not recorded (2 of 2)]
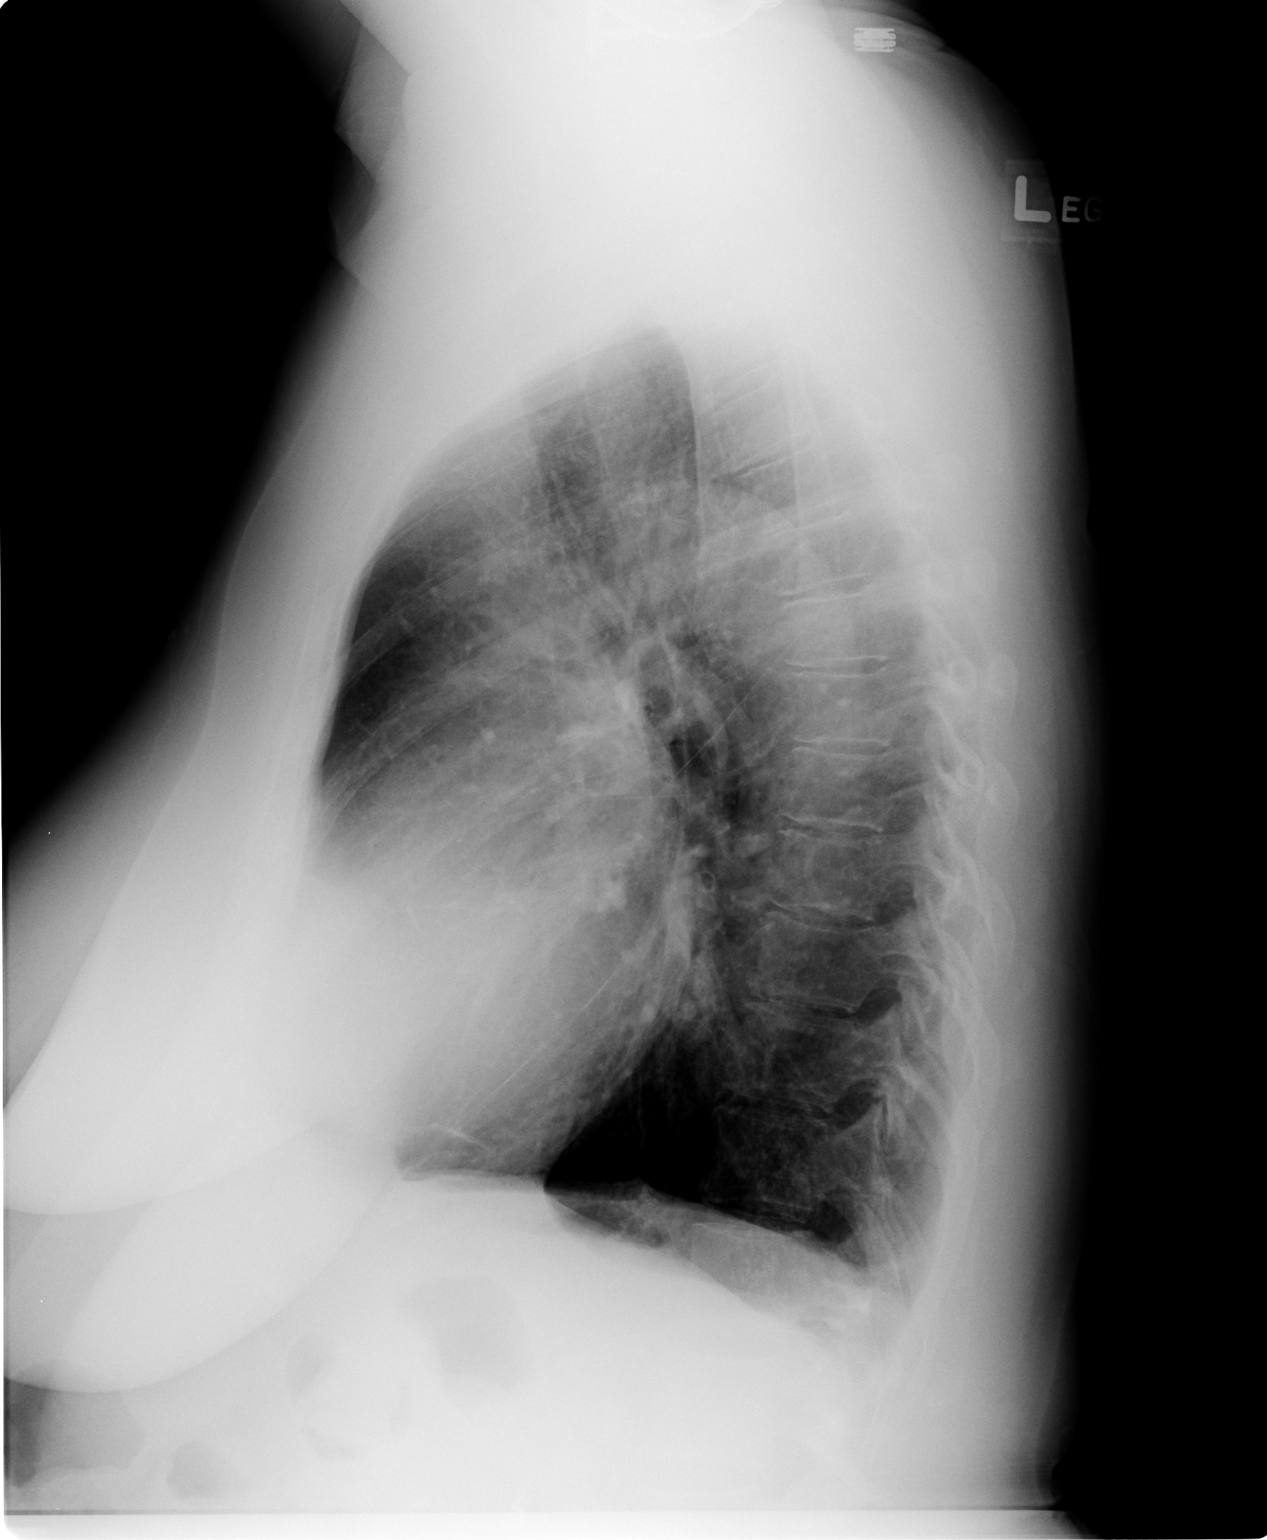

[2 of 2 positions shown; findings below may reference images not displayed]

FINDINGS: Cardiomegaly is stable.  Pulmonary hyperinflation is again seen, consistent with COPD.  There is no evidence of acute infiltrate or pulmonary edema.  There is no evidence of pleural effusion.  No mass or adenopathy identified.  Tortuosity of thoracic aorta is stable.
IMPRESSION: Stable cardiomegaly and COPD.  No active disease.

## 2006-07-17 IMAGING — RF DG LUMBAR SPINE 2-3V
1 series · 2 of 2 positions shown · non-contrast
Comparison: none

CLINICAL DATA: Back pain with radiculopathy. 
 LUMBAR SPINE   - _ VIEW:
 Intraoperative spot views show a Z stop placed posteriorly between the L4 and L5 spinous processes.   Hardware well positioned.

[Series 1: run · 2 of 2 slices shown]
[im 1/2]
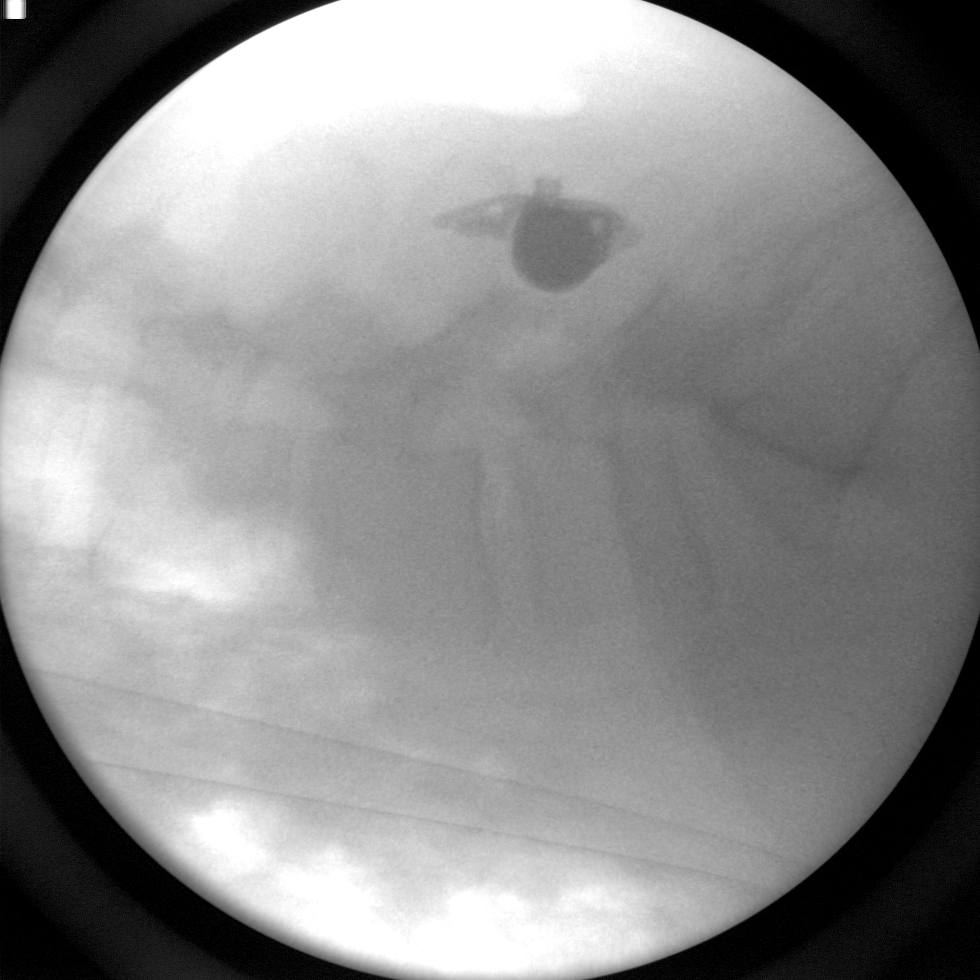
[im 2/2]
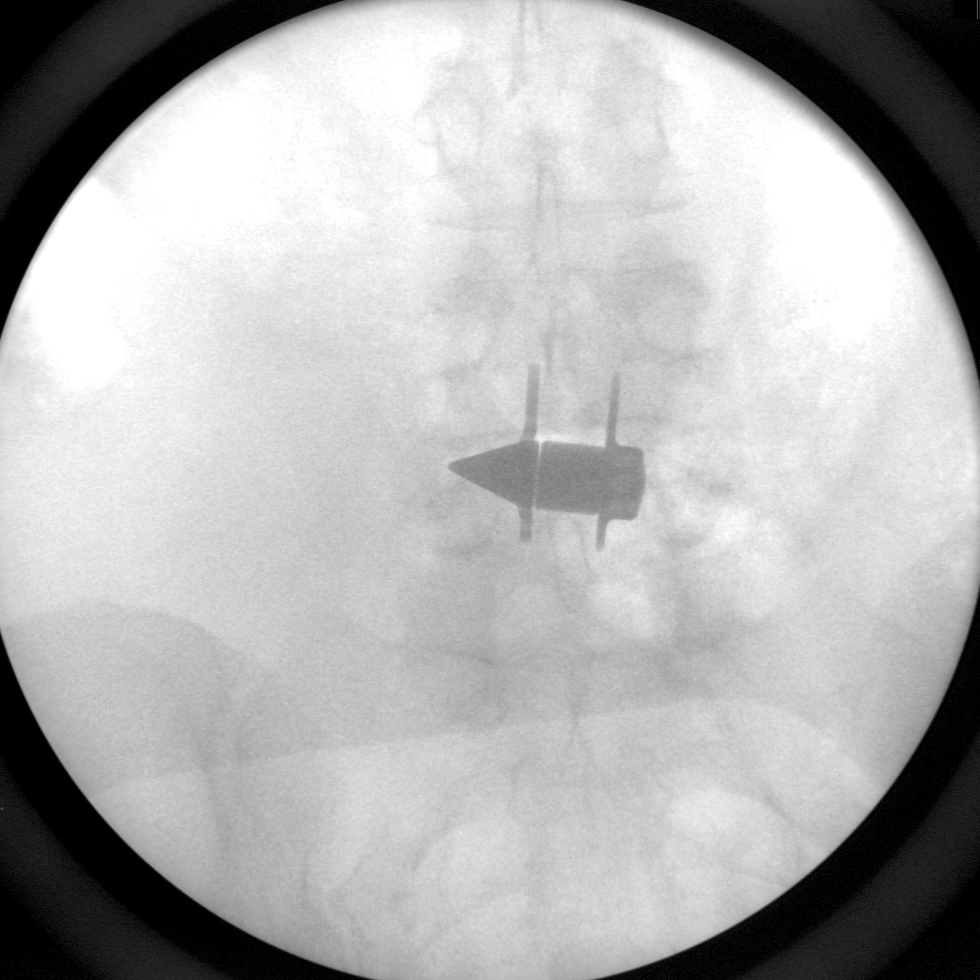

[2 of 2 positions shown; findings below may reference images not displayed]

IMPRESSION: Z stop in satisfactory position.

## 2006-11-11 ENCOUNTER — Encounter (INDEPENDENT_AMBULATORY_CARE_PROVIDER_SITE_OTHER): Payer: Self-pay | Admitting: *Deleted

## 2006-11-11 ENCOUNTER — Ambulatory Visit (HOSPITAL_COMMUNITY): Admission: RE | Admit: 2006-11-11 | Discharge: 2006-11-11 | Payer: Self-pay | Admitting: Nephrology

## 2007-06-13 ENCOUNTER — Other Ambulatory Visit: Admission: RE | Admit: 2007-06-13 | Discharge: 2007-06-13 | Payer: Self-pay | Admitting: Gynecology

## 2007-11-02 ENCOUNTER — Encounter: Admission: RE | Admit: 2007-11-02 | Discharge: 2007-11-02 | Payer: Self-pay | Admitting: Nephrology

## 2007-11-16 ENCOUNTER — Encounter: Admission: RE | Admit: 2007-11-16 | Discharge: 2007-11-16 | Payer: Self-pay | Admitting: Nephrology

## 2007-11-21 ENCOUNTER — Ambulatory Visit (HOSPITAL_COMMUNITY): Admission: RE | Admit: 2007-11-21 | Discharge: 2007-11-21 | Payer: Self-pay | Admitting: Nephrology

## 2007-11-22 ENCOUNTER — Inpatient Hospital Stay (HOSPITAL_COMMUNITY): Admission: EM | Admit: 2007-11-22 | Discharge: 2007-11-30 | Payer: Self-pay | Admitting: Emergency Medicine

## 2007-11-23 ENCOUNTER — Ambulatory Visit: Payer: Self-pay | Admitting: Internal Medicine

## 2007-12-09 ENCOUNTER — Encounter (HOSPITAL_COMMUNITY): Admission: RE | Admit: 2007-12-09 | Discharge: 2008-03-08 | Payer: Self-pay | Admitting: Nephrology

## 2008-03-04 ENCOUNTER — Inpatient Hospital Stay (HOSPITAL_COMMUNITY): Admission: EM | Admit: 2008-03-04 | Discharge: 2008-03-07 | Payer: Self-pay | Admitting: Emergency Medicine

## 2008-03-23 ENCOUNTER — Encounter (HOSPITAL_COMMUNITY): Admission: RE | Admit: 2008-03-23 | Discharge: 2008-06-21 | Payer: Self-pay | Admitting: Nephrology

## 2008-05-17 ENCOUNTER — Ambulatory Visit (HOSPITAL_COMMUNITY): Admission: RE | Admit: 2008-05-17 | Discharge: 2008-05-17 | Payer: Self-pay | Admitting: Nephrology

## 2008-06-10 IMAGING — CR DG CHEST 2V
2 series · 2 of 2 positions shown · non-contrast
Comparison: 12/04/05.

CLINICAL DATA: Cough, congestion.
 CHEST - 2 VIEWS:

[w chest pa]
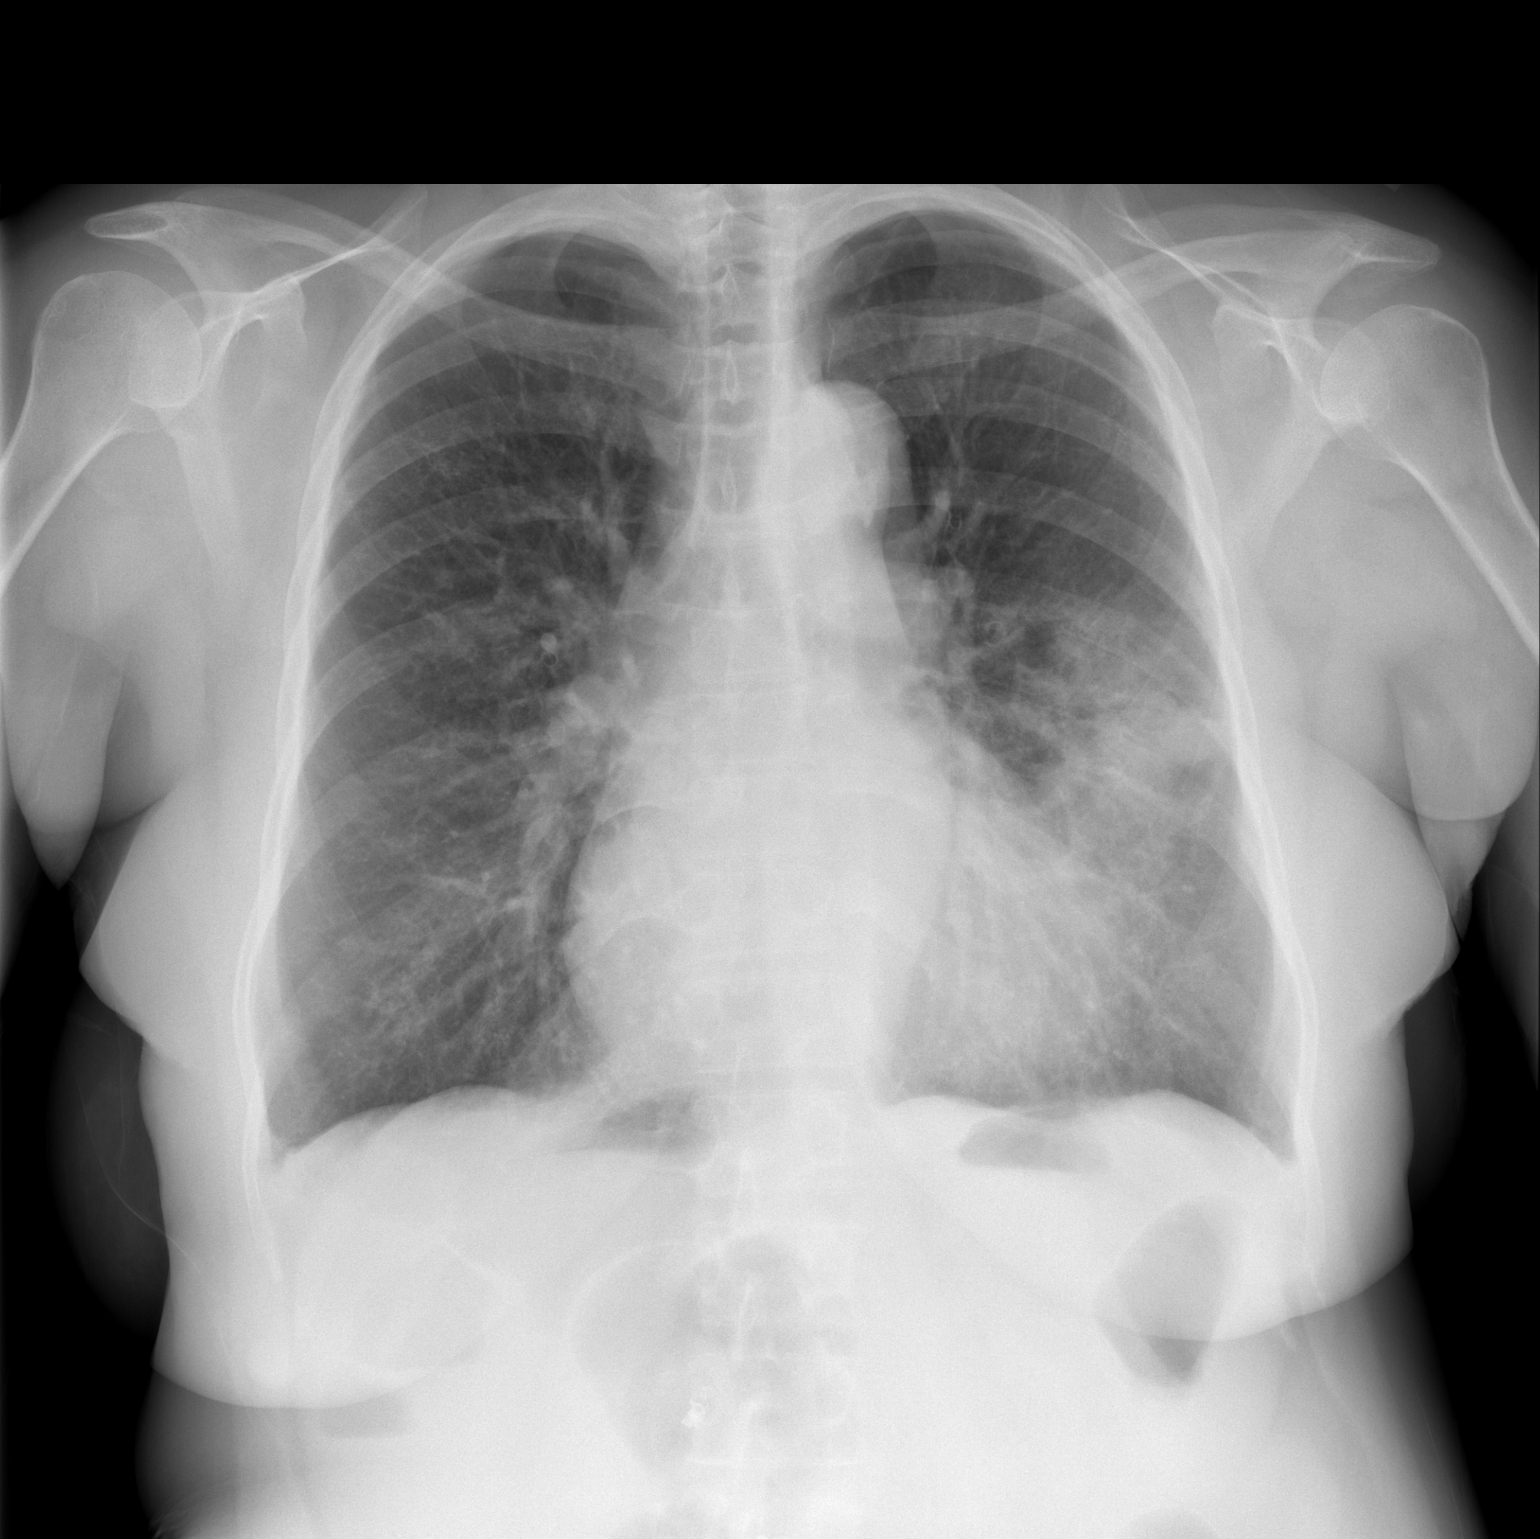

[w chest lat]
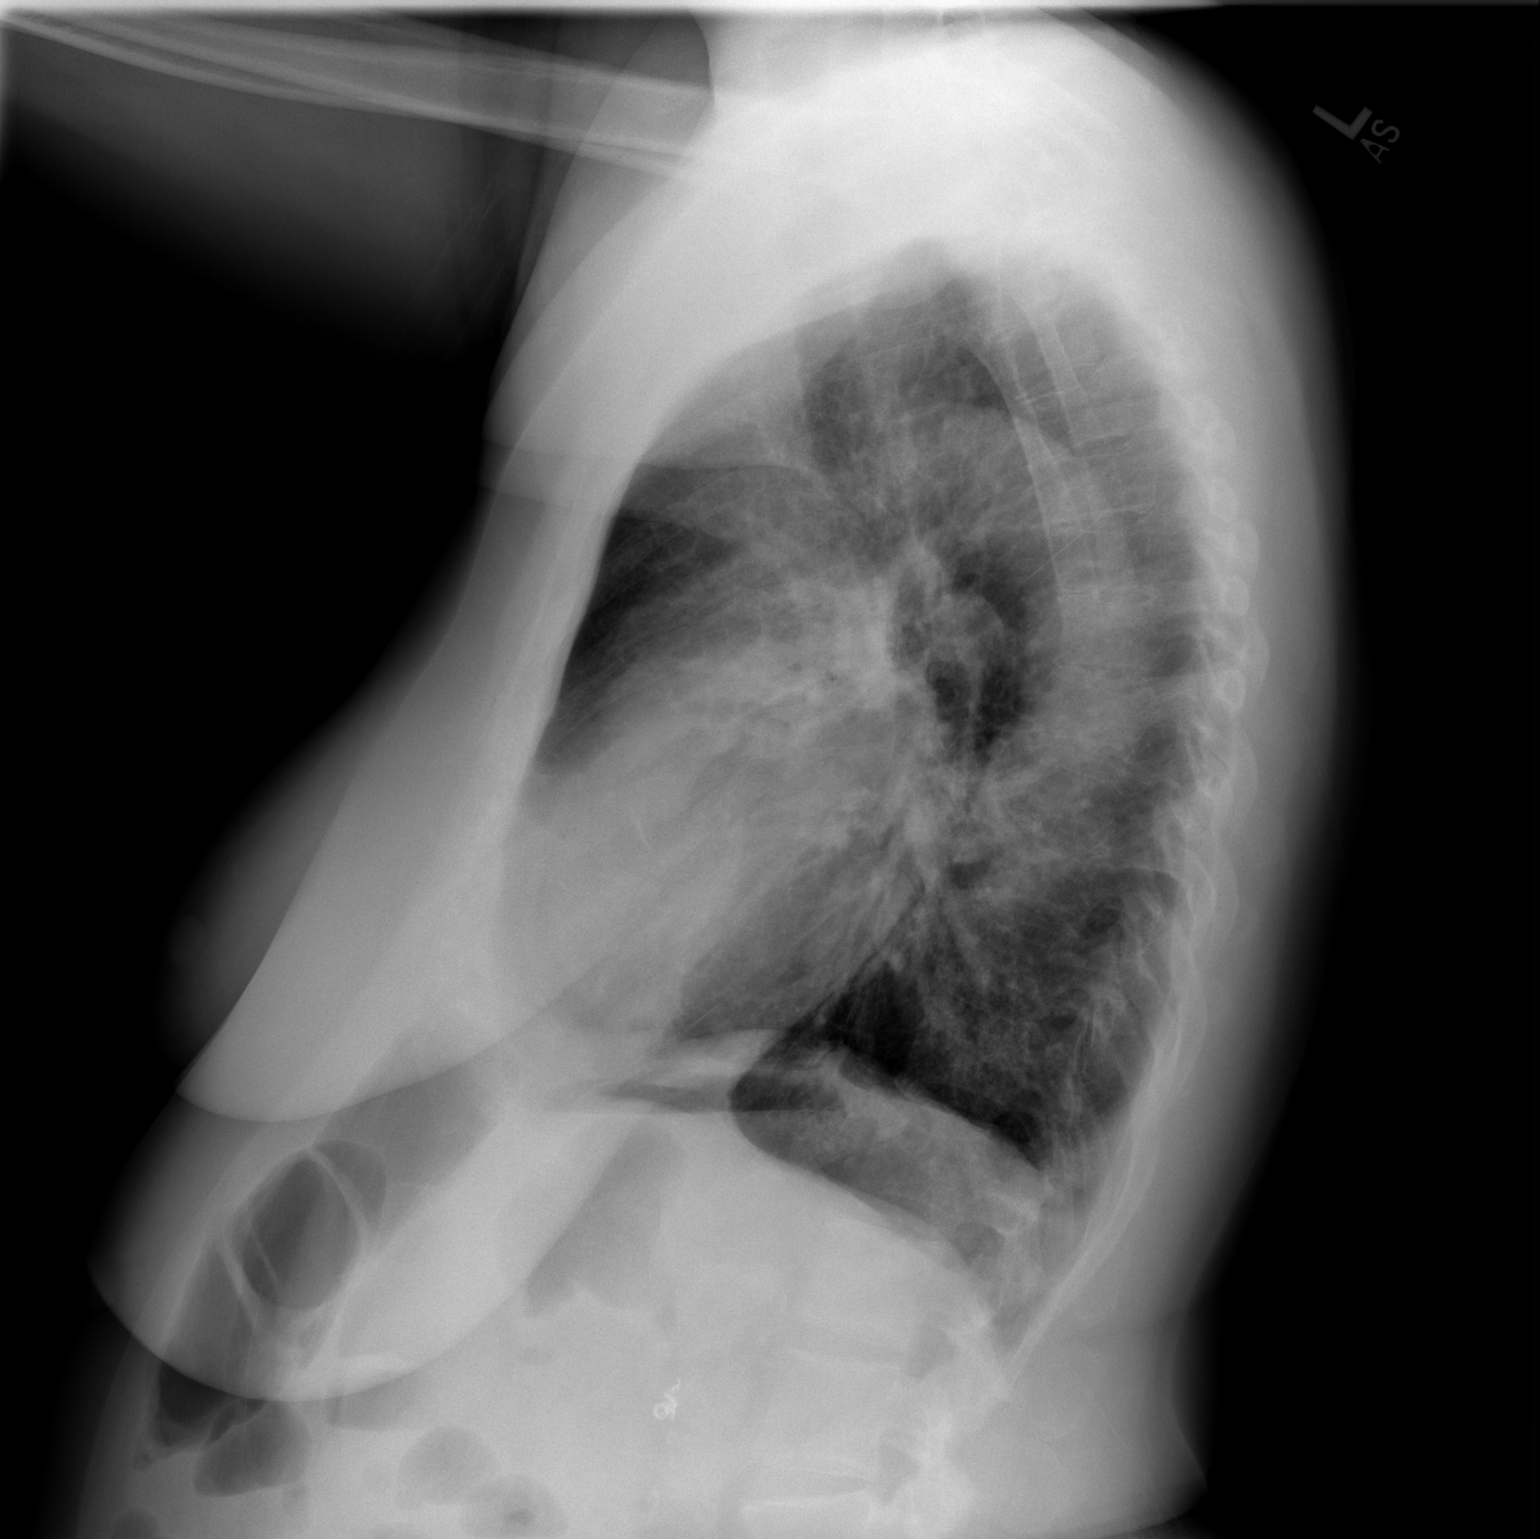

[2 of 2 positions shown; findings below may reference images not displayed]

Two views of the chest show opacity in the anterior left upper lobe extending into the lingula consistent with pneumonia.  The remainder of the lungs are clear.  No effusion is seen.  Mild cardiomegaly is stable.
IMPRESSION: Left upper lobe-lingular pneumonia.

## 2008-06-24 IMAGING — US US RENAL
1 series · 14 of 25 positions shown · non-contrast
Comparison: None.

CLINICAL DATA: Increased creatinine.  Renal transplant patient. 
 RENAL ULTRASOUND:
TECHNIQUE: Complete ultrasound examination of the urinary tract was performed including evaluation of the kidneys, renal collecting systems, and urinary bladder.

[Series 1: unknown · 0.27mm/px · 14 of 42 slices shown]
[im 1/42]
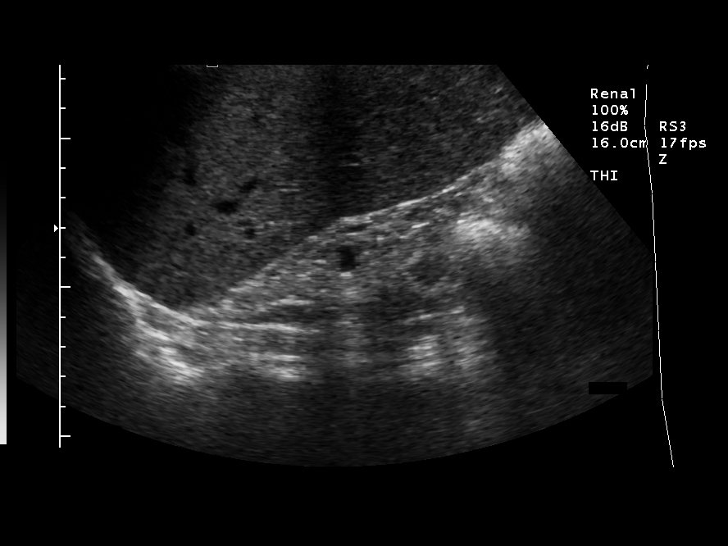
[im 4/42]
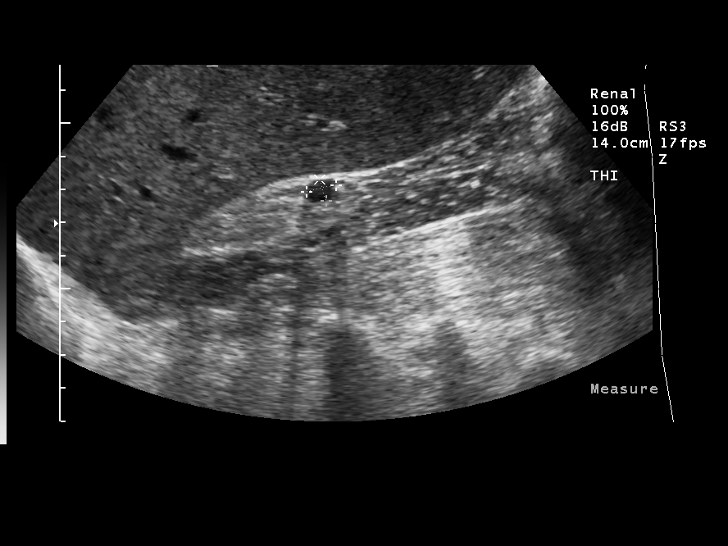
[im 7/42]
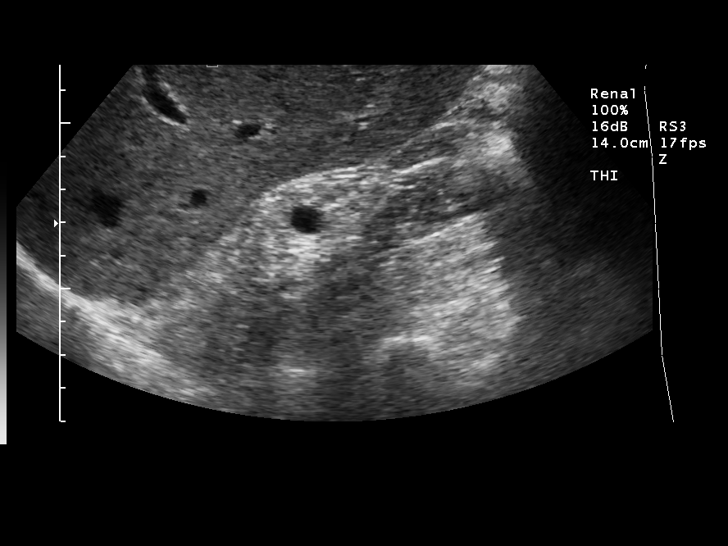
[im 11/42]
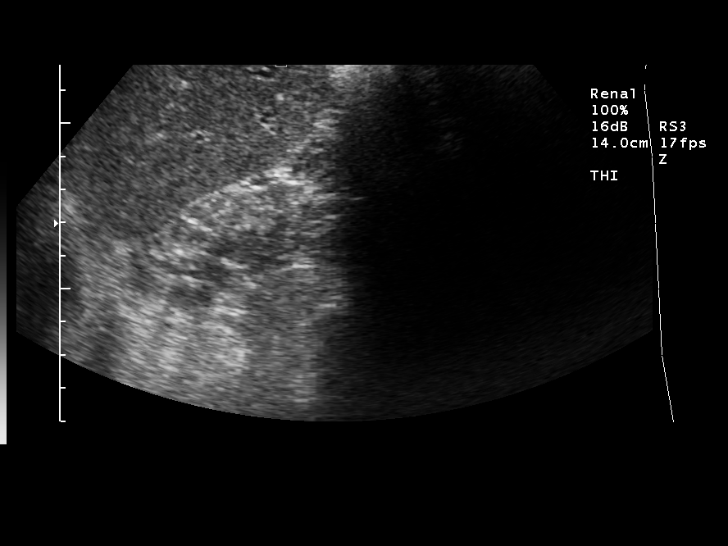
[im 14/42]
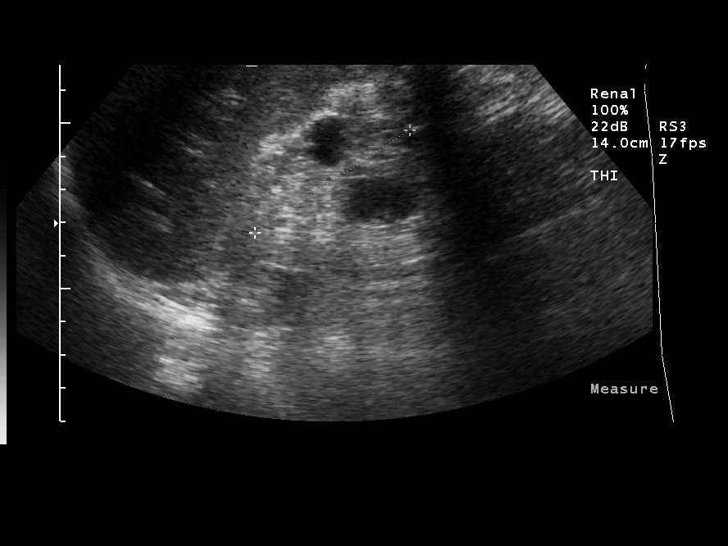
[im 16/42]
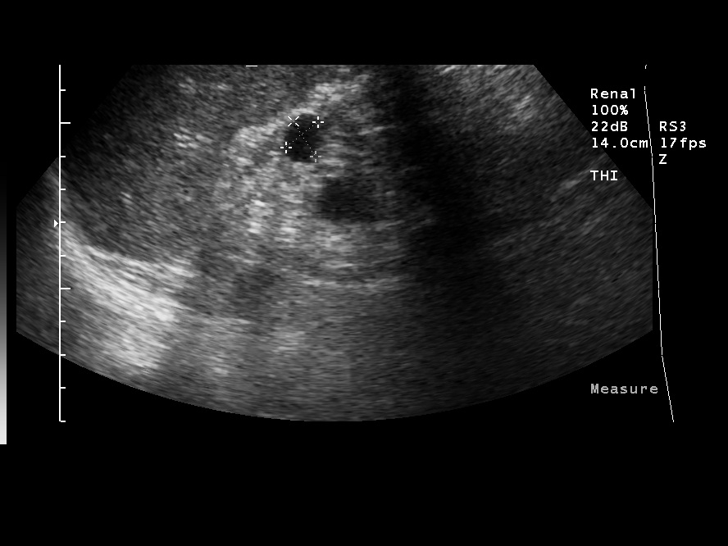
[im 19/42]
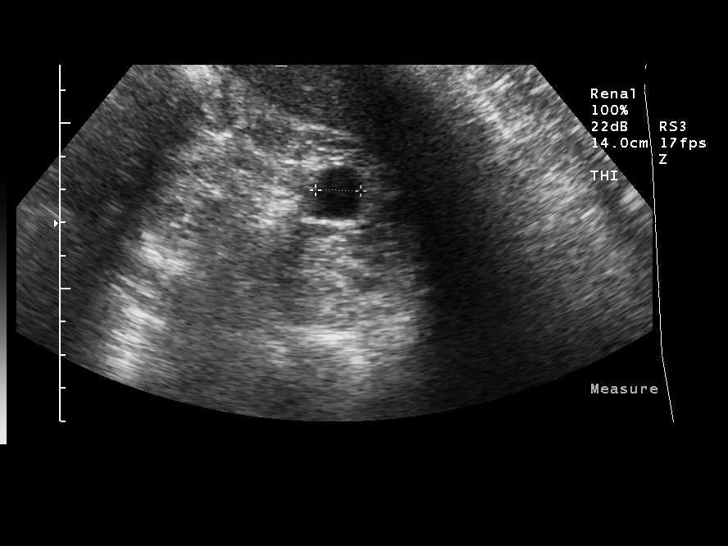
[im 23/42]
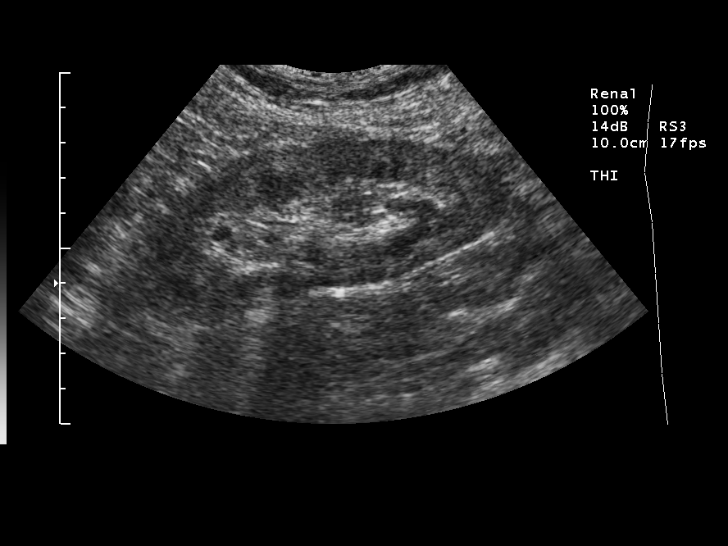
[im 26/42]
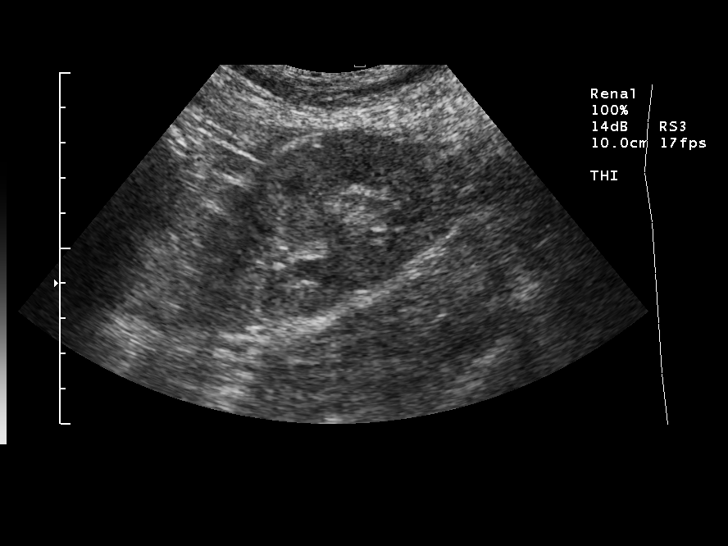
[im 28/42]
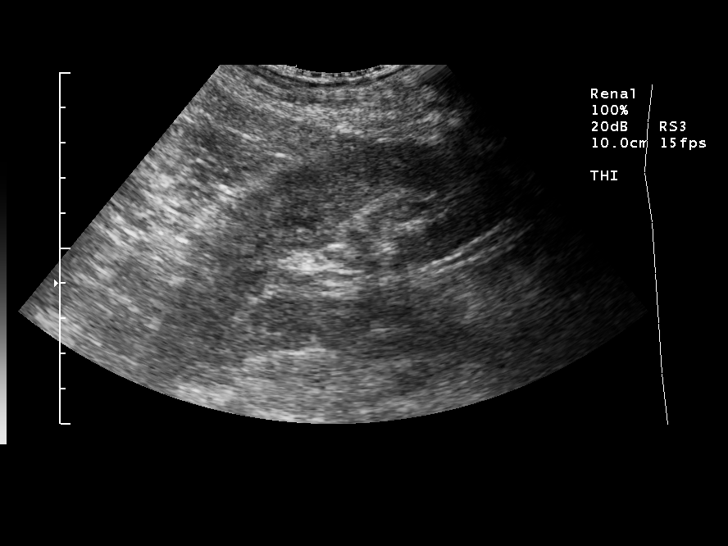
[im 31/42]
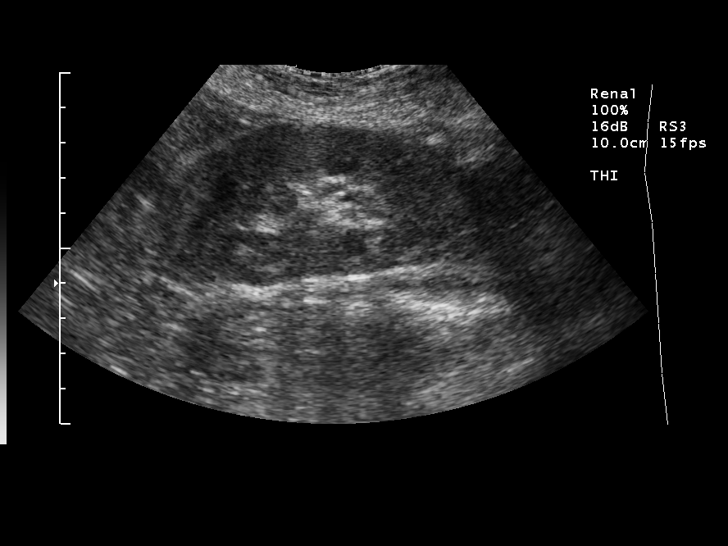
[im 35/42]
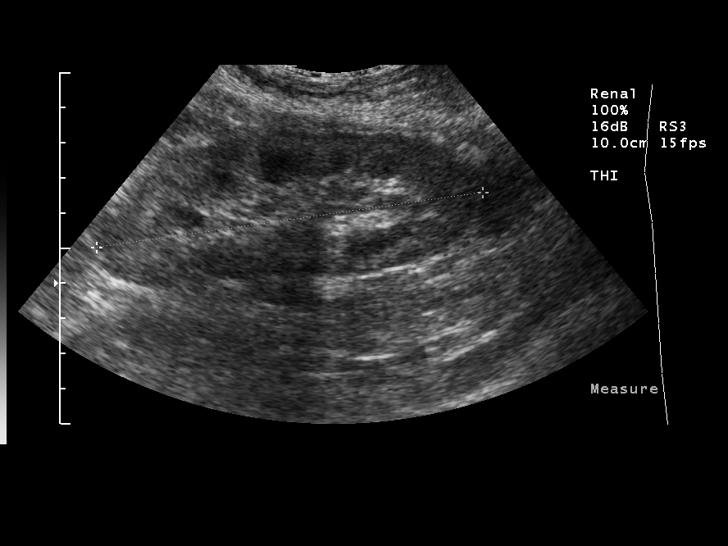
[im 38/42]
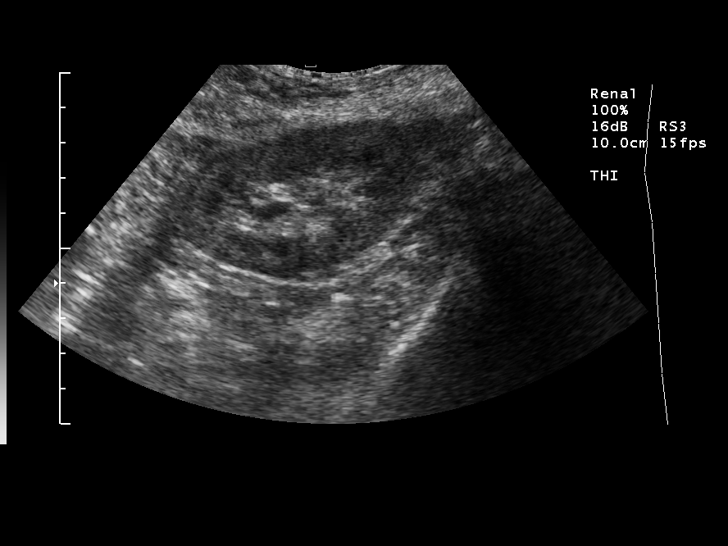
[im 42/42]
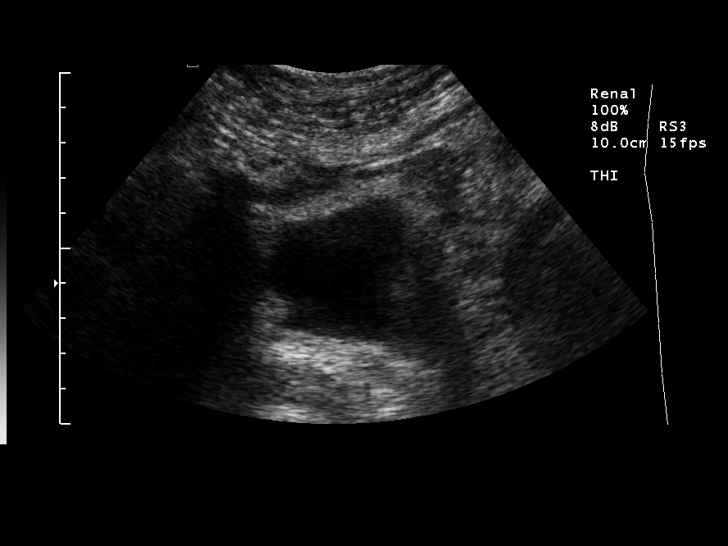

[14 of 25 positions shown; findings below may reference images not displayed]

FINDINGS: Both native kidneys are small and very echogenic.  The right kidney measures 5.4cm sagittally with the left kidney measuring 5.6cm.  Small cysts are noted bilaterally.  The renal transplant measures 11.1cm sagittally, and there is blood flow present.  No hydronephrosis is seen.  The transplant renal parenchyma may be somewhat echogenic.  The urinary bladder is decompressed.
IMPRESSION: 1.  Small echogenic native kidneys. 
 2.  No hydronephrosis of the renal transplant.  The renal transplant may be slightly echogenic.

## 2008-06-30 IMAGING — CR DG CHEST 2V
2 series · 2 of 2 positions shown · non-contrast
Comparison: 11/02/07.

CLINICAL DATA: Pneumonia.  
CHEST - 2 VIEW:

[w chest pa]
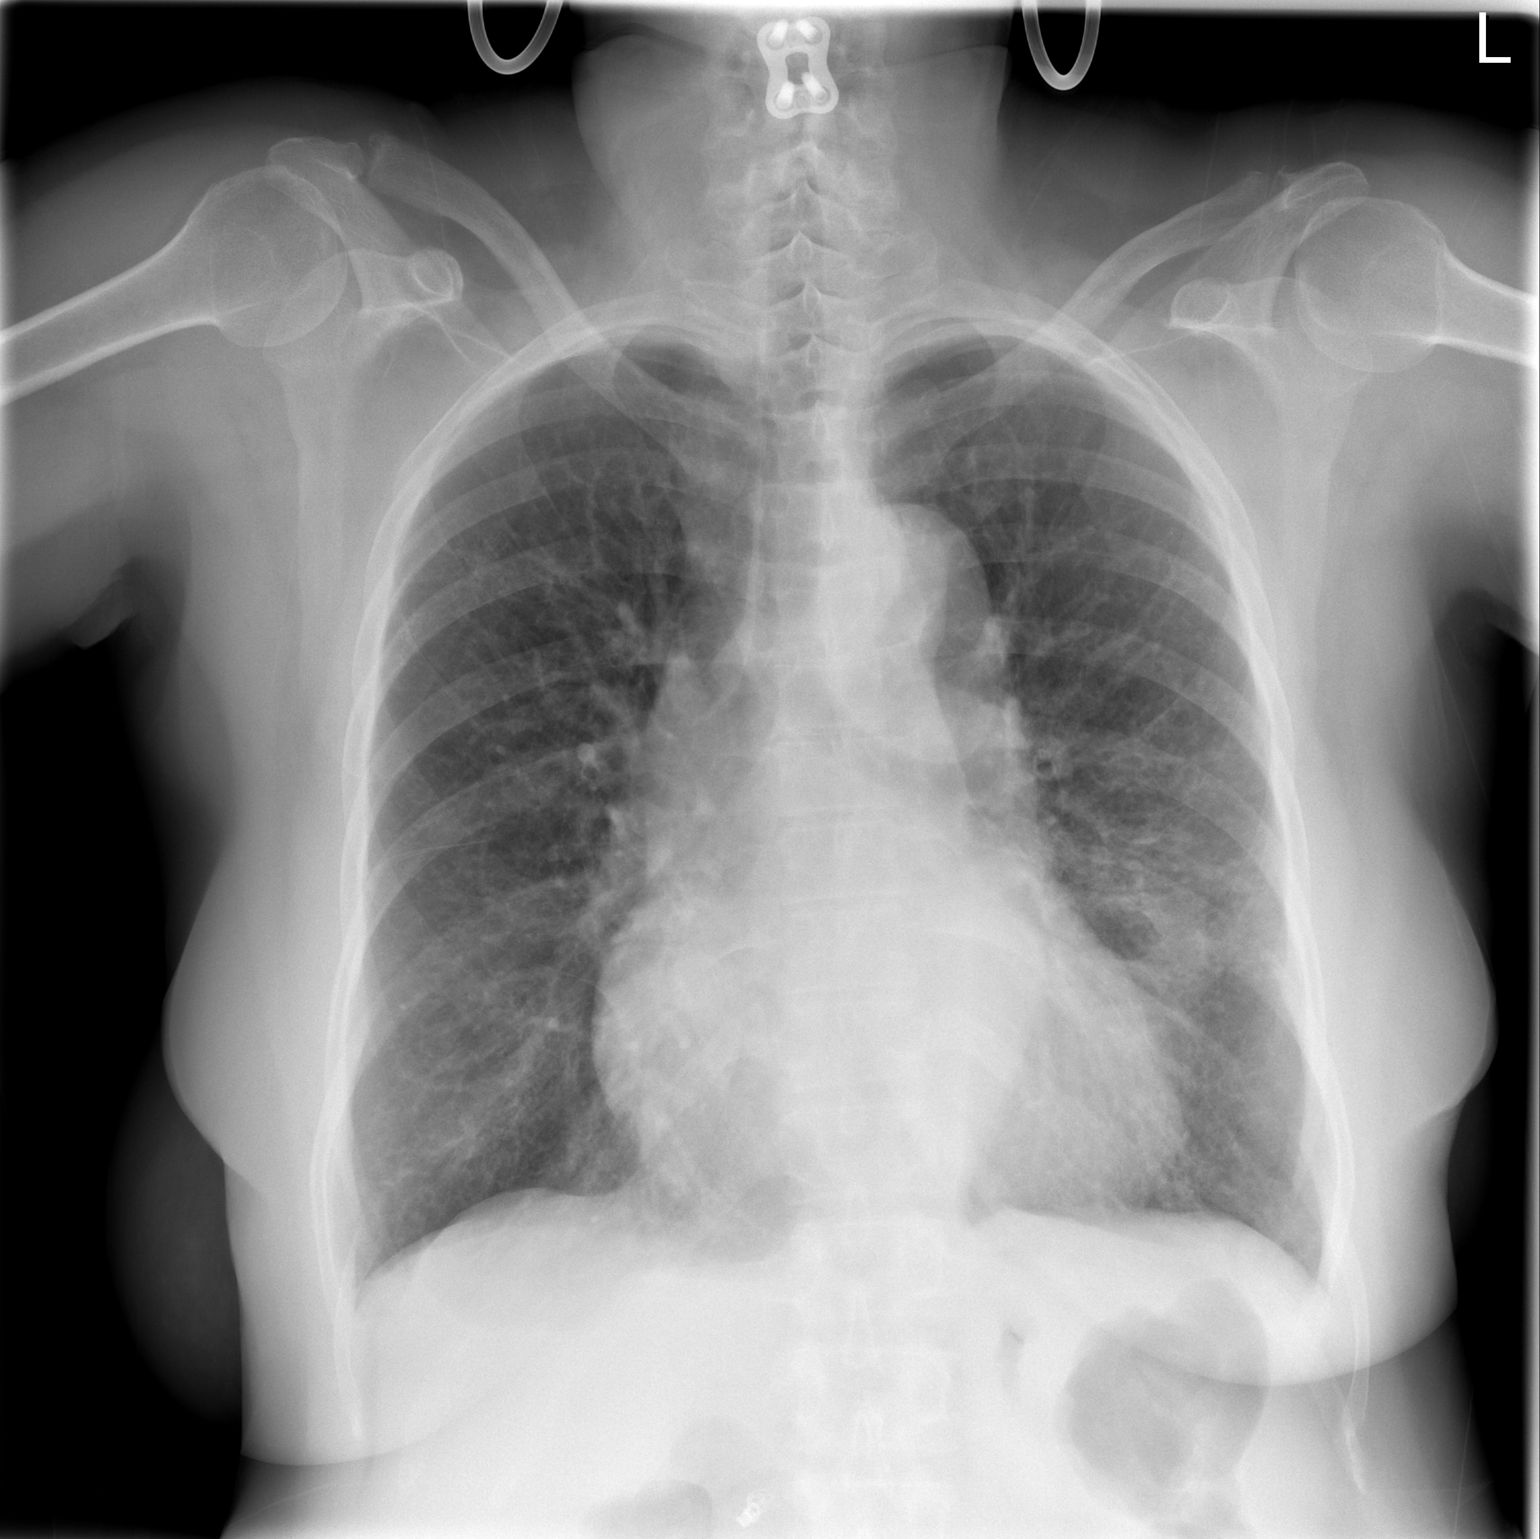

[w chest lat]
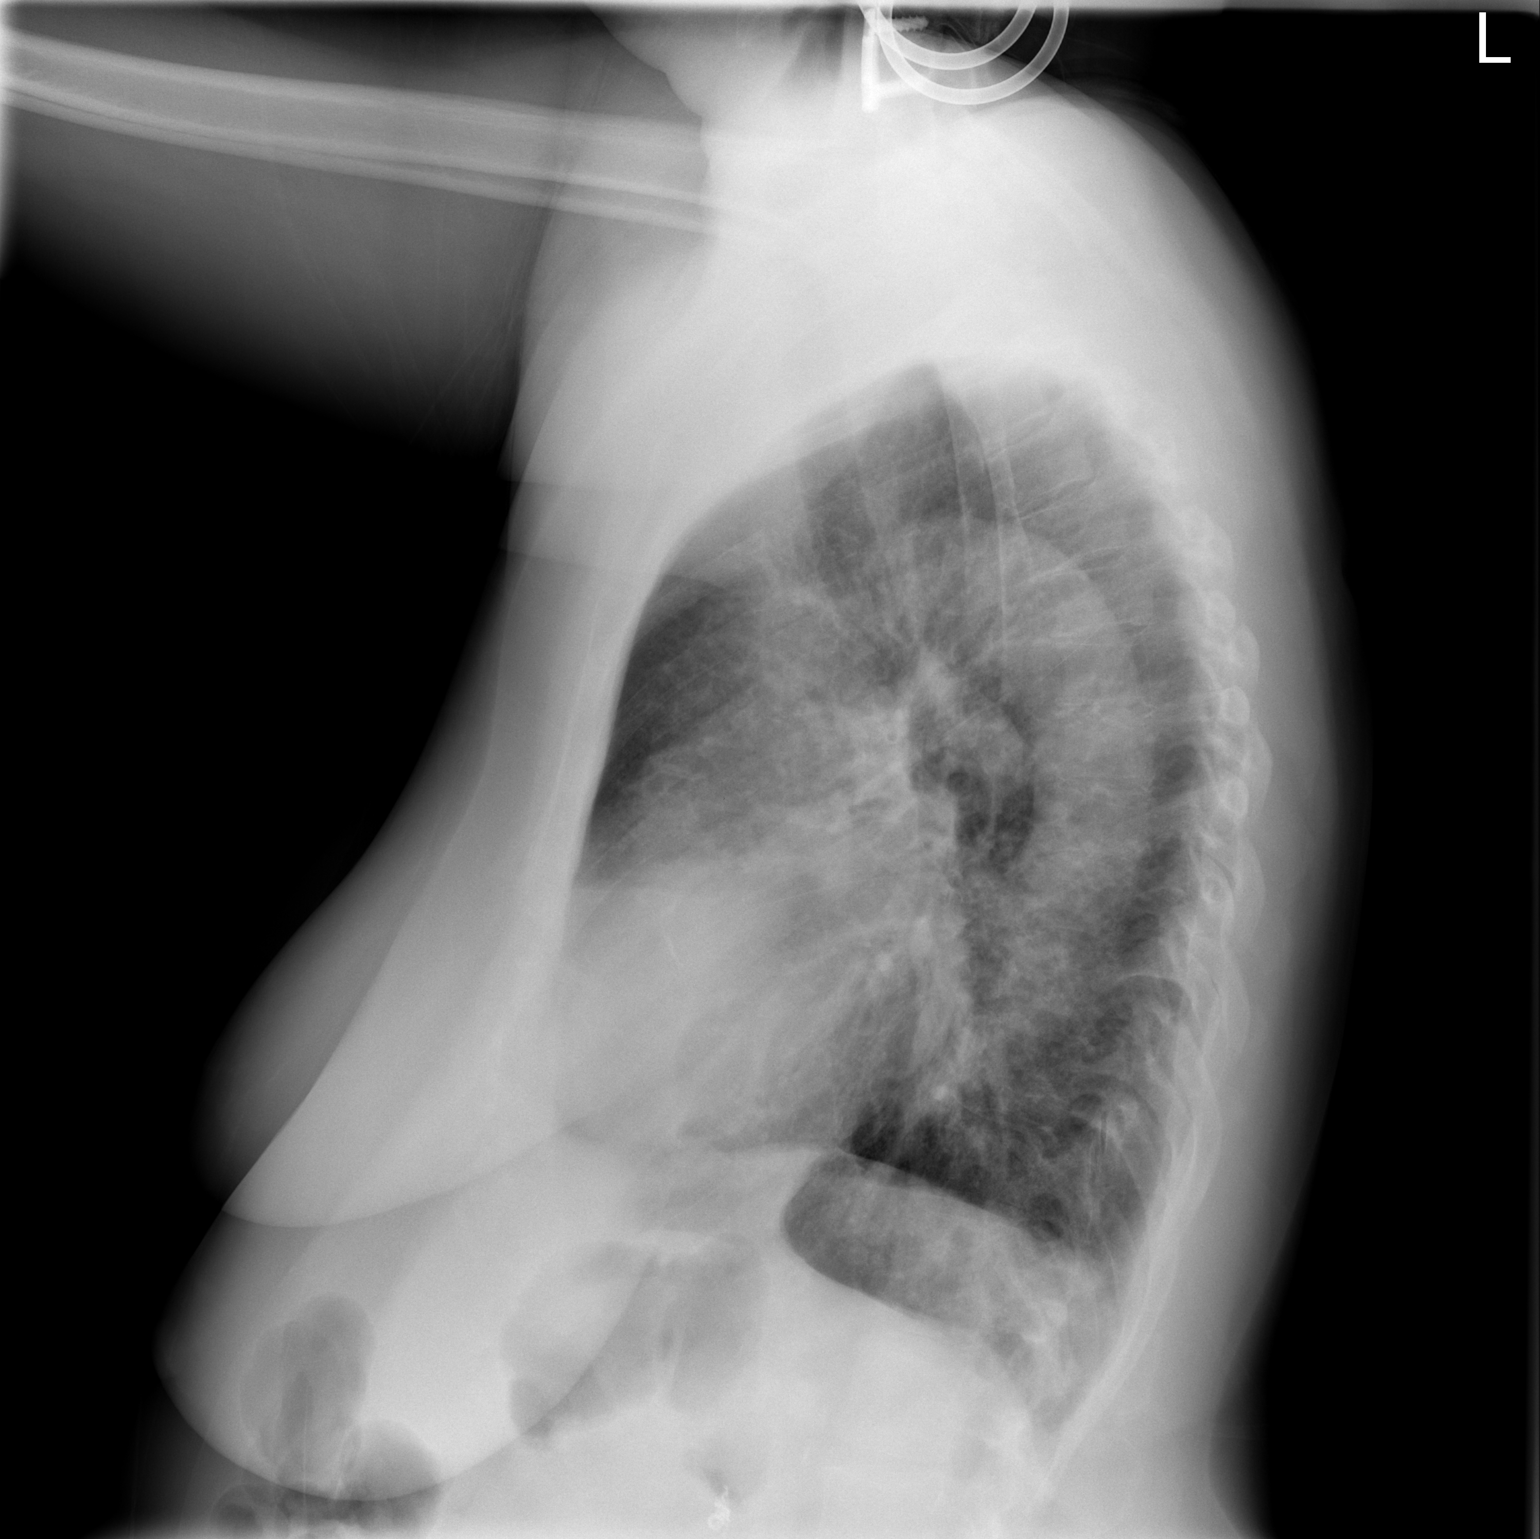

[2 of 2 positions shown; findings below may reference images not displayed]

FINDINGS: The previously noted lingular infiltrate has not cleared.  Cavitation may have occurred in the interim.  CT may prove helpful for further delineation.  This could be performed without contrast if the patient cannot tolerate such.  The heart is enlarged with pulmonary vascular congestion.  A call is in to Dr. Mucsi.  Tortuous aorta.
IMPRESSION: 1.  Incomplete clearance of lingula infiltrate now with  questionable cavitation.  CT may be considered for further delineation.  
2.  Cardiomegaly and pulmonary vascular congestion.  
3.  Tortuous aorta.

## 2008-07-01 IMAGING — CT CT CHEST W/O CM
2 of 3 series · 15 of 36 positions shown, 18 images · IV contrast (agent unspecified)
Comparison: none

CLINICAL DATA: Shortness of breath.  Productive cough.  Chronic renal failure.  Previous renal transplant.  Pneumonia.  Followup evaluation.  
CHEST CT WITHOUT CONTRAST:
TECHNIQUE: Multidetector CT imaging of the chest was performed following the standard protocol without IV contrast.
There are peribronchial reticulonodular infiltrative changes seen within the lingula and also within the lower lobes.  In addition, there are slight peripheral cylindrical bronchiectatic changes seen within the lower lobes.  These type of changes may be seen with previous infections and secondary scarring with cylindrical bronchiectasis.  The acute changes within the lingula and left lower lobe are indeterminate and may be seen with atypical infections including atypical mycobacterial infection.  There is no evidence for cavitation and the appearance noted on the screening study was likely due to a bleb related to adjacent aerated lung.  There is volume loss noted within the lingula with bronchiectasis.  There is an area of vague nodularity within the right upper lobe measuring 5 x 11mm in size on image #22, series 3.  Recommend followup CT of the chest in 3 months to be certain that this area remains stable.   There are also areas of nodular pleural thickening likely postinflammatory.  The central airways are patent.  There is a tortuous left subclavian artery noted which extends medially adjacent to the esophagus (image sequence #[id number]#12.  There is no adenopathy.

[Series 2: routine chest · axial · 0.70mm/px · z∈[-297,-52]mm · 12 of 59 slices shown, 15 images]
[im 5/59  mediastinal]
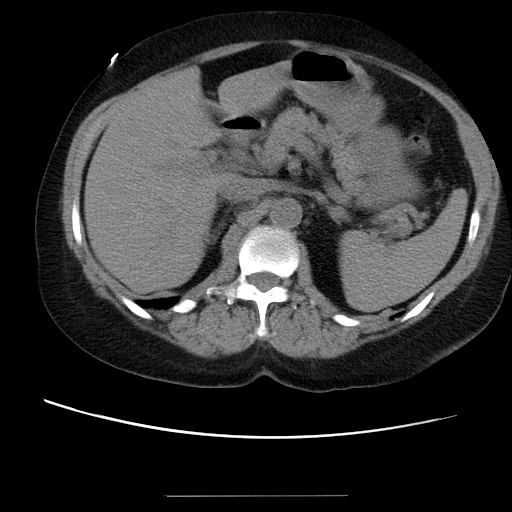
[im 5/59  lung]
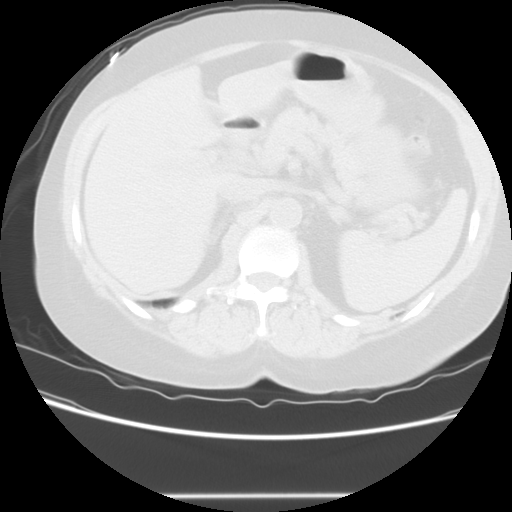
[im 9/59  lung]
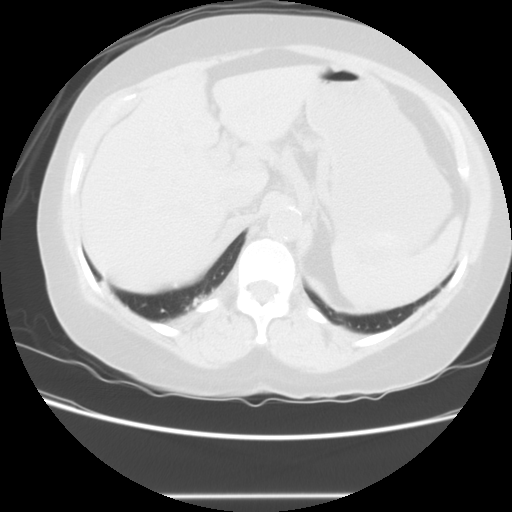
[im 13/59  lung]
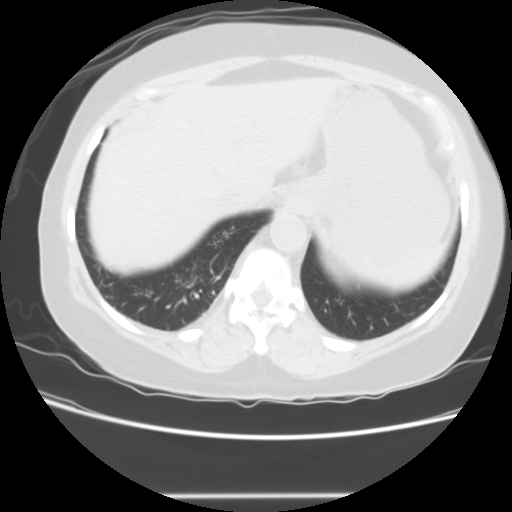
[im 18/59  lung]
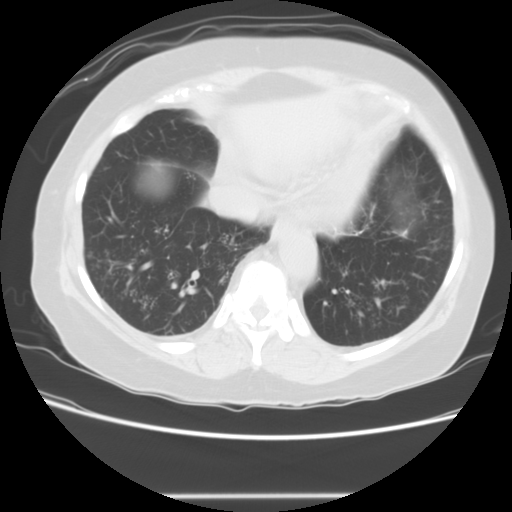
[im 22/59  mediastinal]
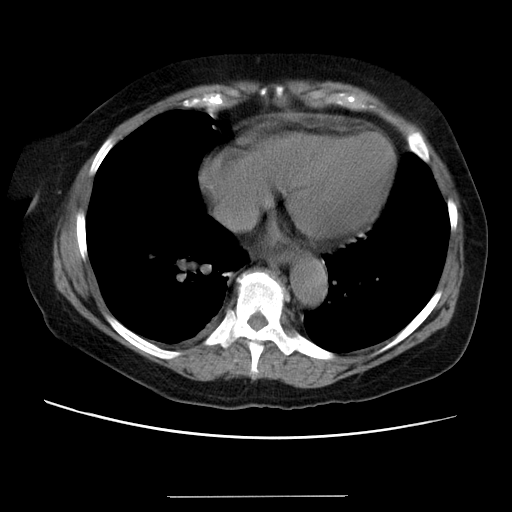
[im 22/59  lung]
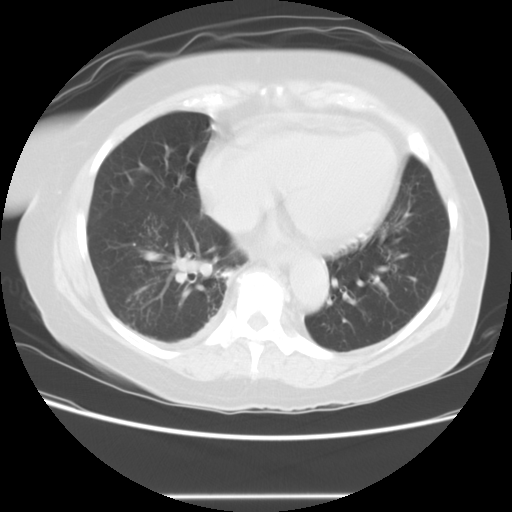
[im 26/59  lung]
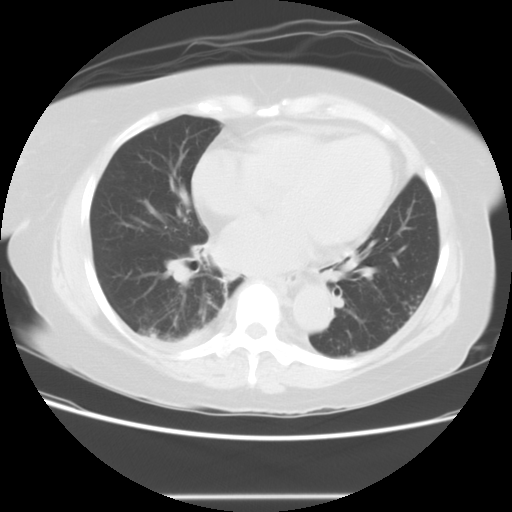
[im 33/59  lung]
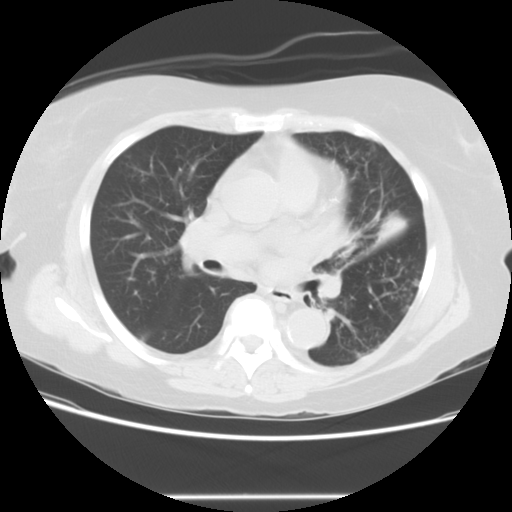
[im 37/59  lung]
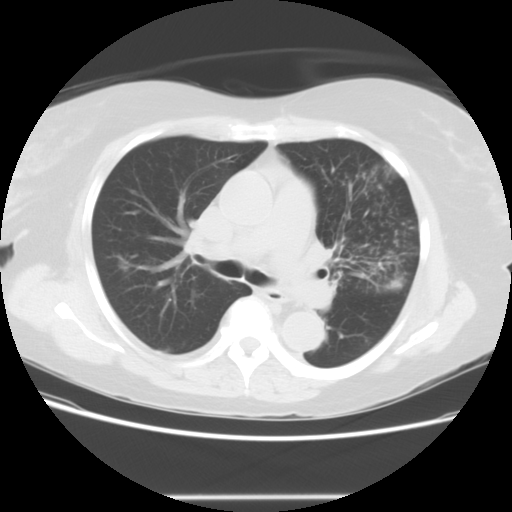
[im 41/59  mediastinal]
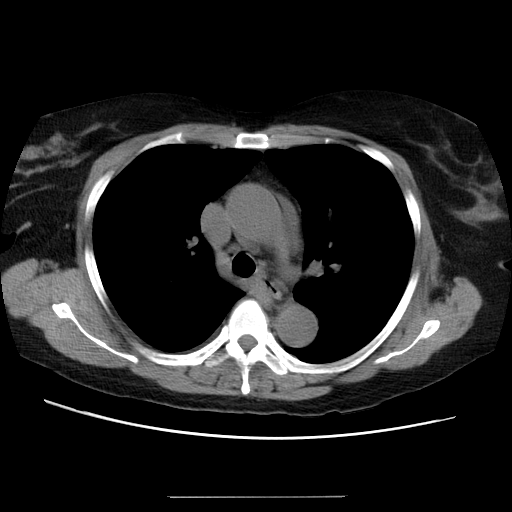
[im 41/59  lung]
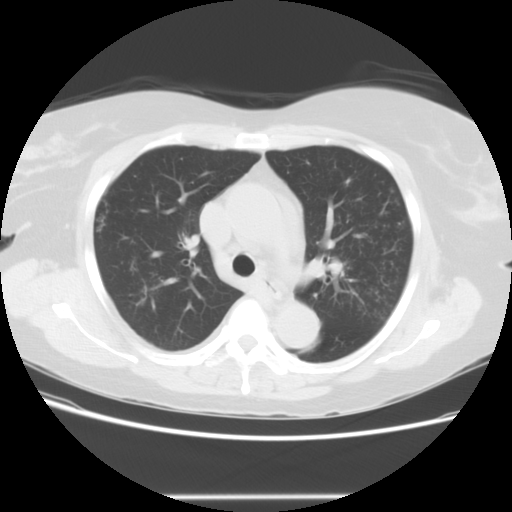
[im 46/59  lung]
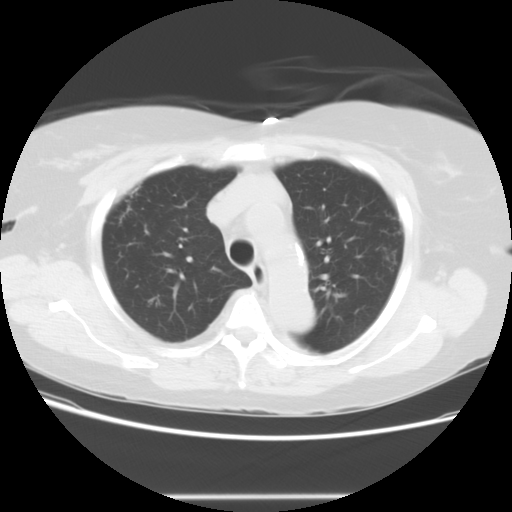
[im 50/59  lung]
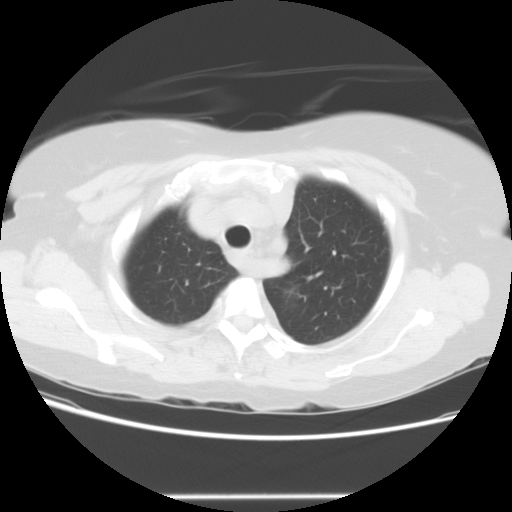
[im 54/59  lung]
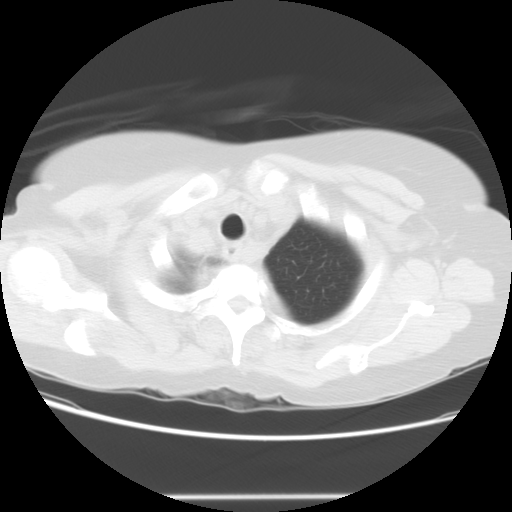

[Series 401: reformatted · coronal · 0.70mm/px · 3 of 111 slices shown]
[im 23/111  lung]
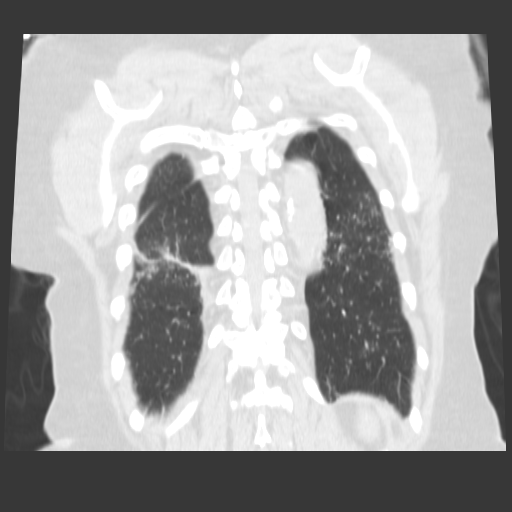
[im 45/111  lung]
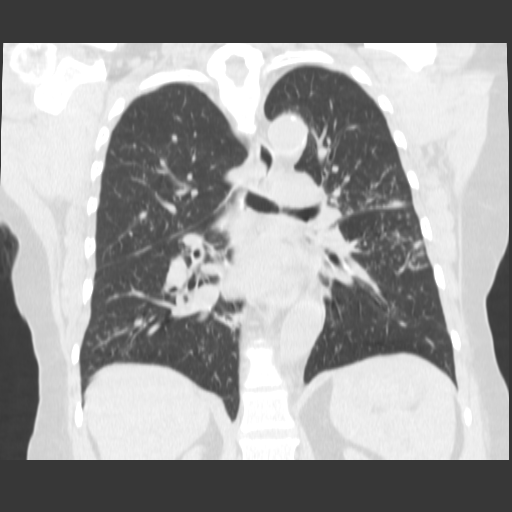
[im 67/111  lung]
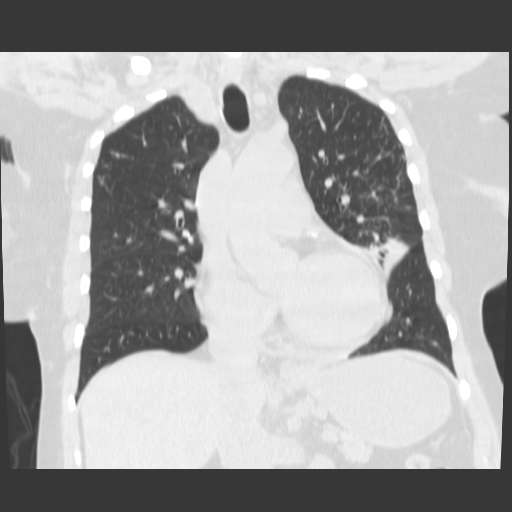

[15 of 36 positions shown; findings below may reference images not displayed]

IMPRESSION: 1.  Reticular nodular infiltrative changes within the lingula and left lower lobe most consistent with an atypical infection (not specific).  There are mild cylindrical bronchiectatic changes within the left lower lobes and mild cylindrical bronchiectasis with volume loss noted within the lingula as well inferiorly located.
2.  5 x 11mm in size noncalcified nodular density within the right upper lobe (image # 22).  Recommend followup chest CT scan in 3 months with attention to this nodule.  There are also areas of nodular pleural thickening noted.
3.  No evidence for a cavitary lesion.

## 2008-07-31 ENCOUNTER — Other Ambulatory Visit: Admission: RE | Admit: 2008-07-31 | Discharge: 2008-07-31 | Payer: Self-pay | Admitting: Gynecology

## 2008-09-11 ENCOUNTER — Encounter: Admission: RE | Admit: 2008-09-11 | Discharge: 2008-09-11 | Payer: Self-pay | Admitting: Nephrology

## 2008-10-05 ENCOUNTER — Encounter (HOSPITAL_COMMUNITY): Admission: RE | Admit: 2008-10-05 | Discharge: 2008-12-13 | Payer: Self-pay | Admitting: Nephrology

## 2008-10-11 IMAGING — US US RENAL TRANSPLANT
1 series · 14 of 25 positions shown · non-contrast
Comparison: 11/16/2007

CLINICAL DATA: Diarrhea;

ULTRASOUND RENAL TRANSPLANT

[Series 1: unknown · 0.27mm/px · 14 of 26 slices shown]
[im 1/26]
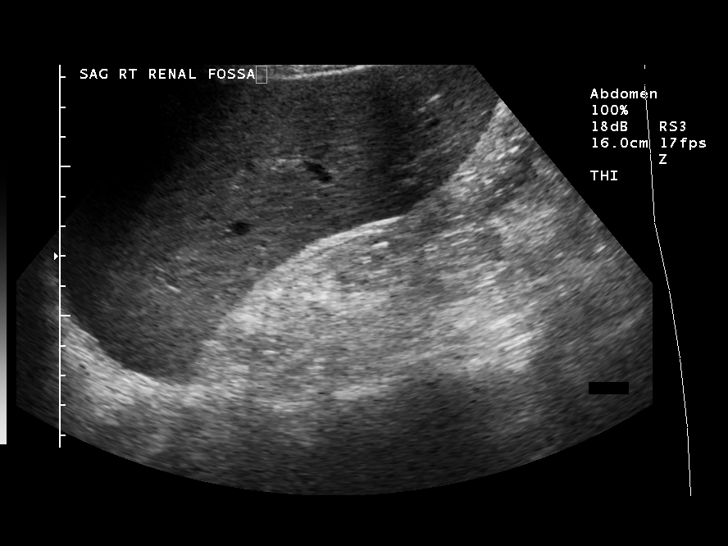
[im 3/26]
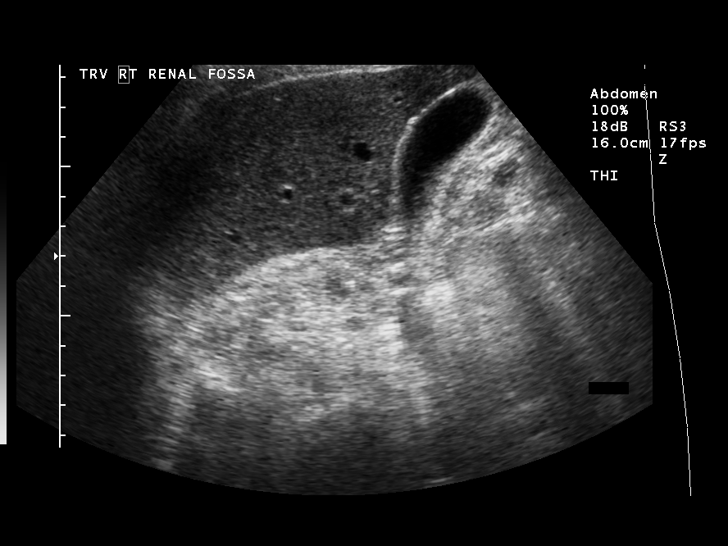
[im 5/26]
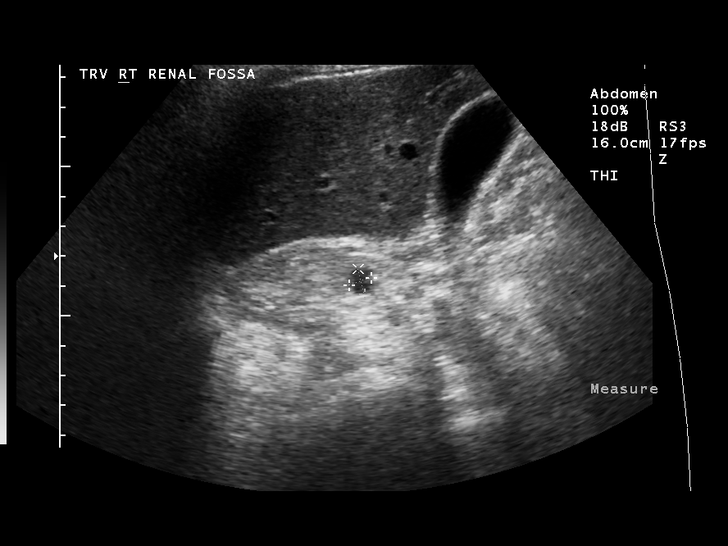
[im 7/26]
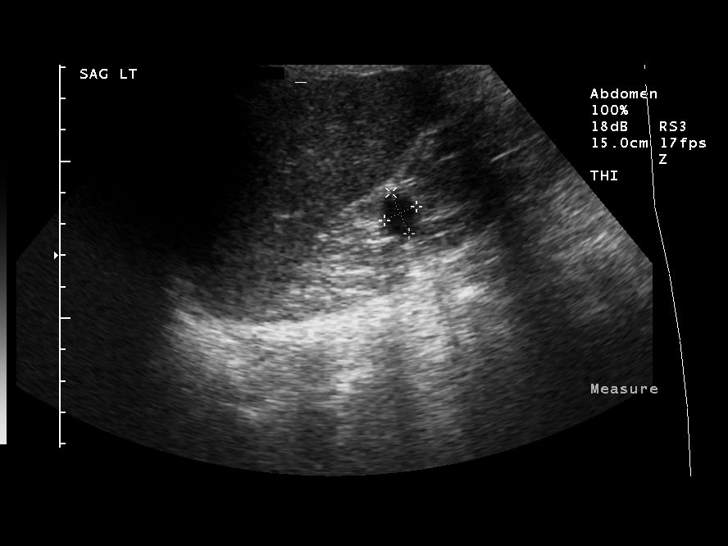
[im 9/26]
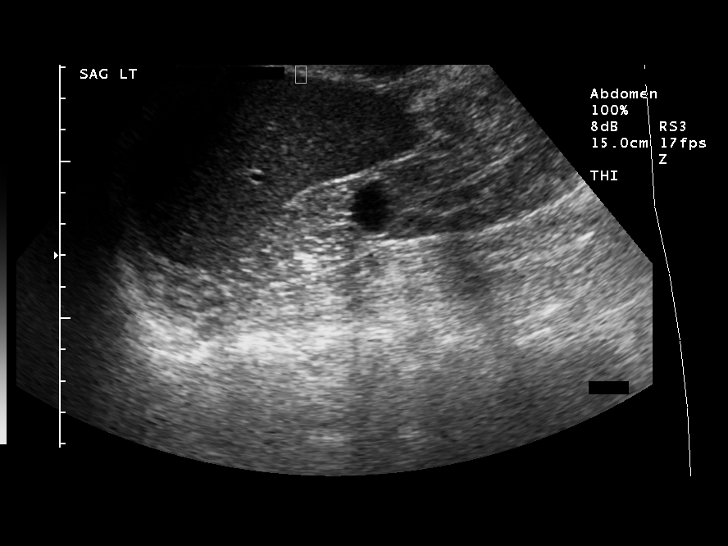
[im 10/26]
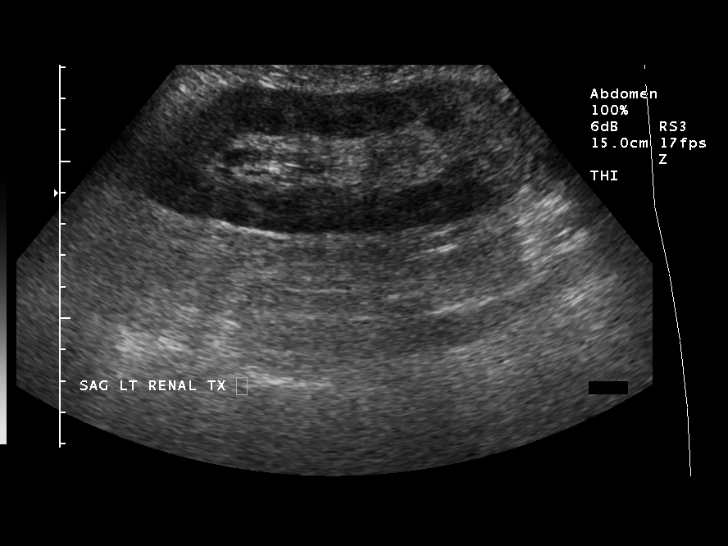
[im 12/26]
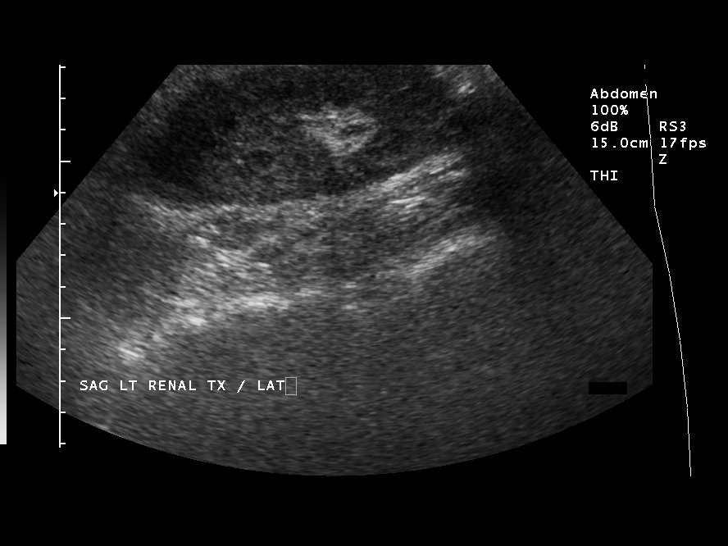
[im 14/26]
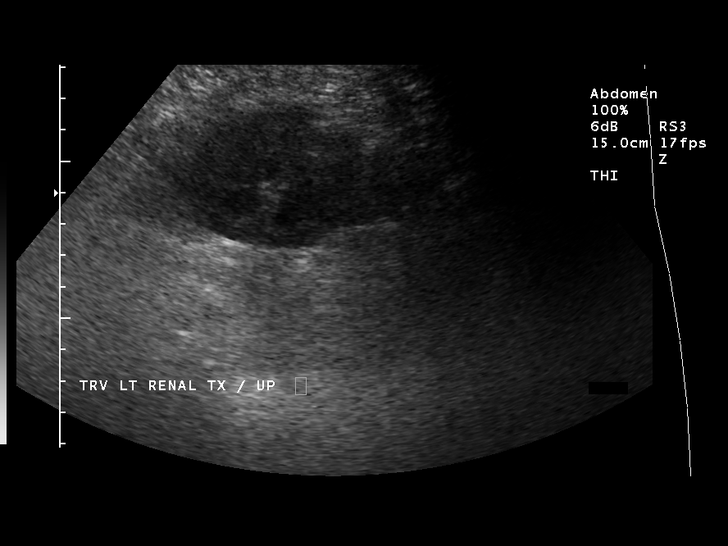
[im 16/26]
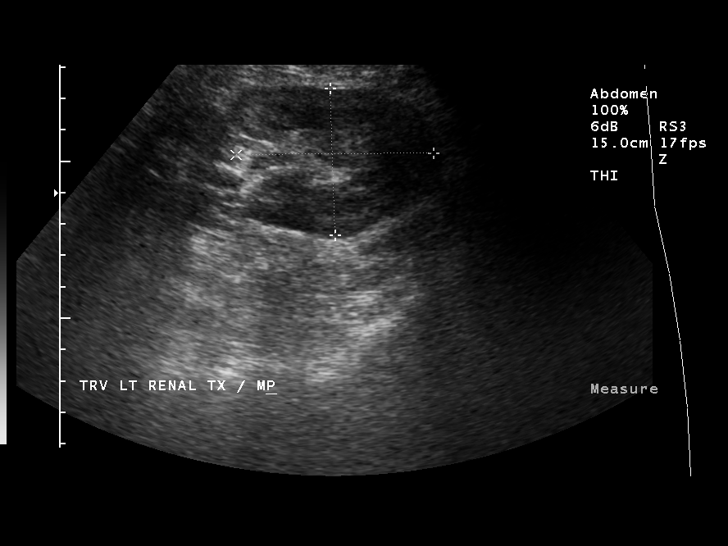
[im 17/26]
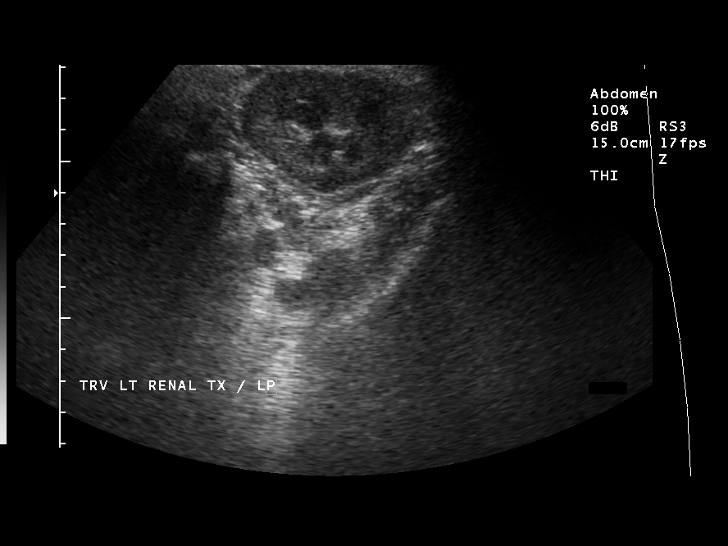
[im 19/26]
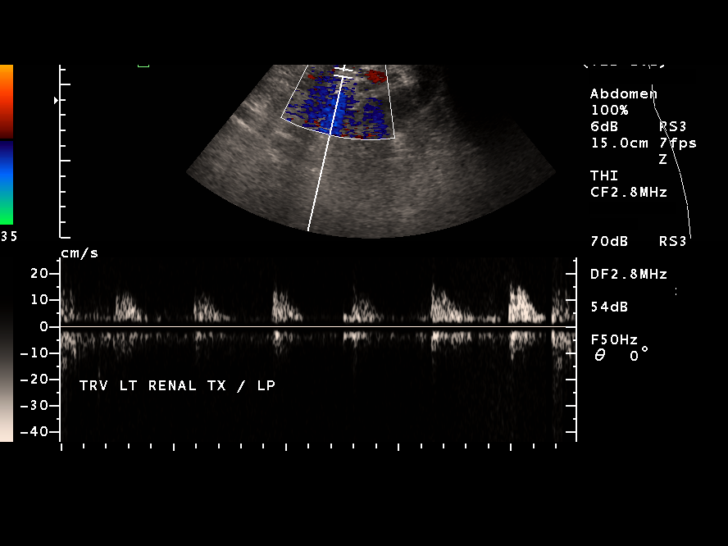
[im 21/26]
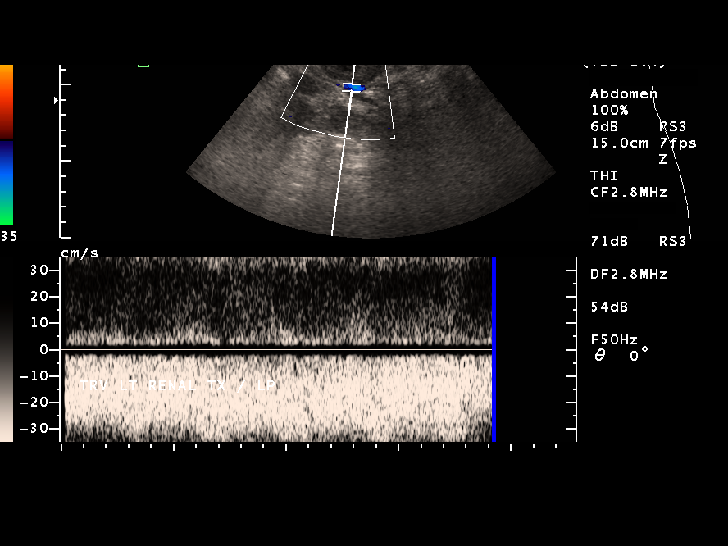
[im 23/26]
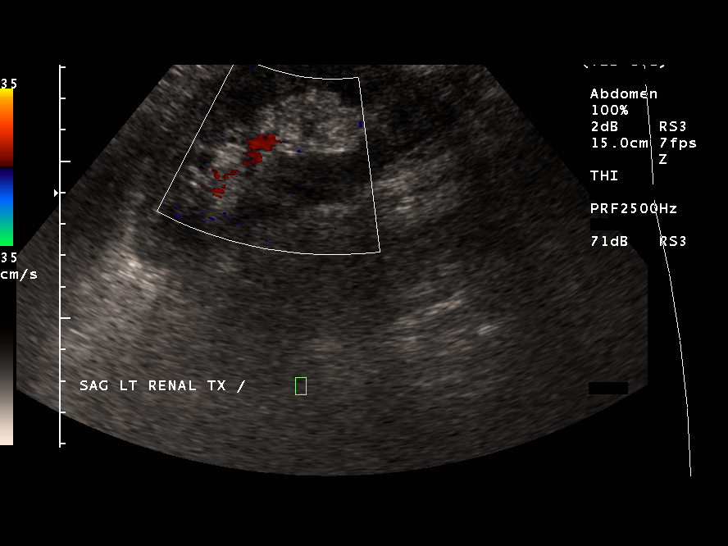
[im 26/26]
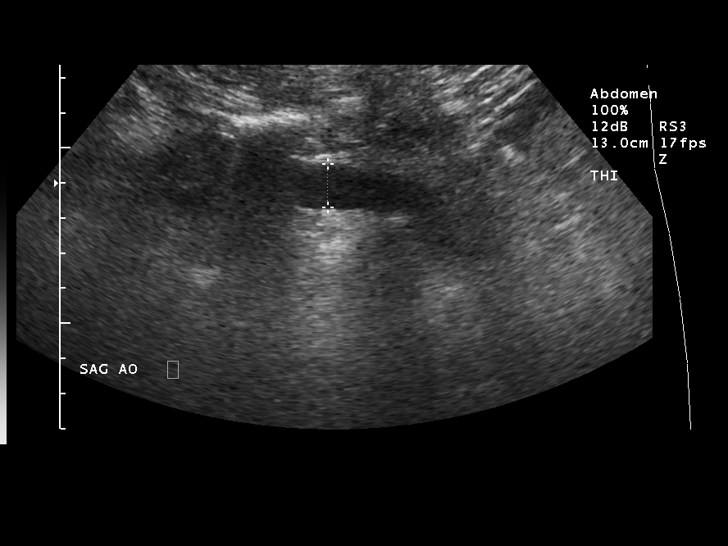

[14 of 25 positions shown; findings below may reference images not displayed]

FINDINGS: The native right kidney measures 8 cm in length, markedly
echogenic with a 8 x 9 mm cyst in its lower pole. Left kidney is
echogenic, approximately 9.7 cm in length containing a 14 mm cyst
in its midportion.  Transplant kidney in the left iliac fossa
measures 4.7 x 6.3 x 12.1 cm. There is suggestion of minimal
pelvicaliectasis but no overt hydronephrosis.  Normal
intraparenchymal arterial and venous waveforms are identified.  The
urinary bladder is nondistended.
IMPRESSION: 1.  No evidence of renal transplant hydronephrosis or major
vascular abnormality

## 2008-10-12 IMAGING — CR DG CHEST 2V
2 series · 2 of 2 positions shown · non-contrast
Comparison: 11/22/07.

CLINICAL DATA: Hypotension, dehydration, fever, shortness of breath. 
CHEST - 2 VIEW:

[w chest pa]
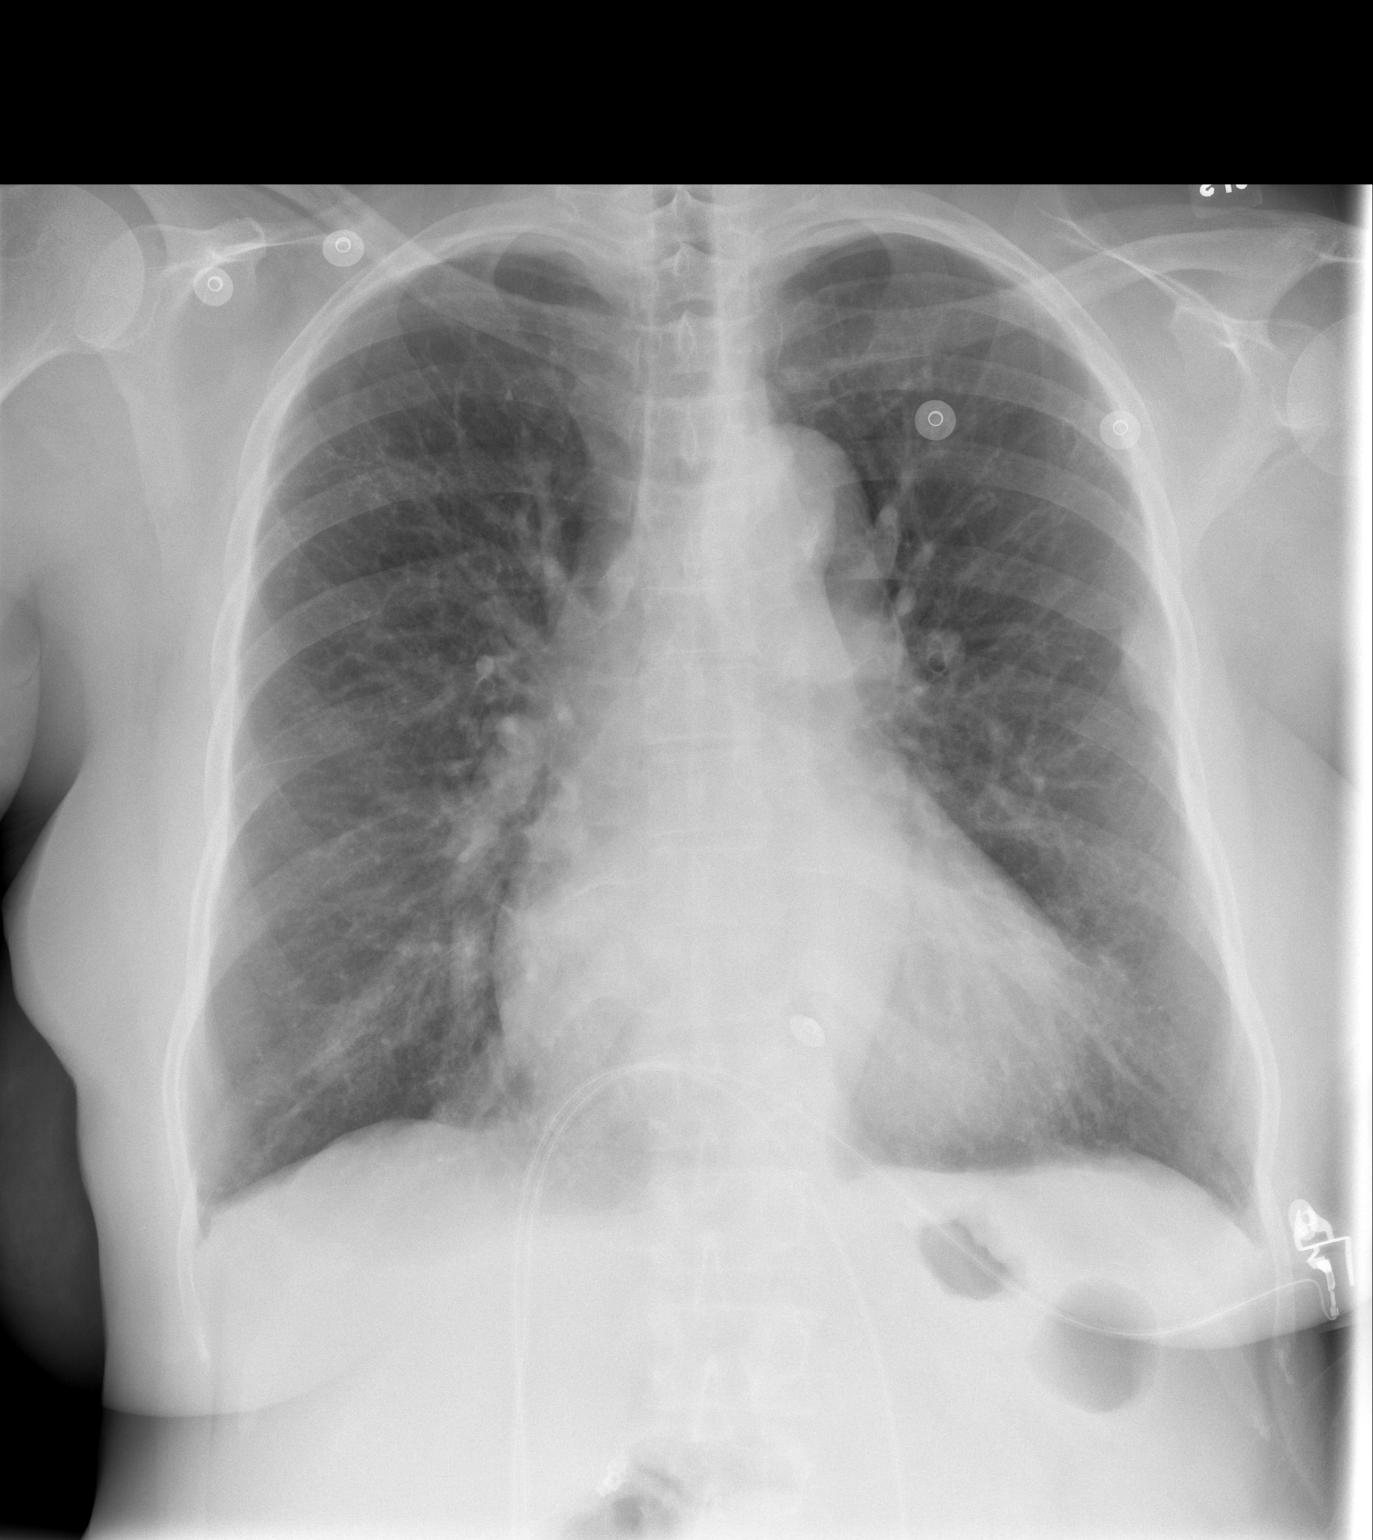

[w chest lat]
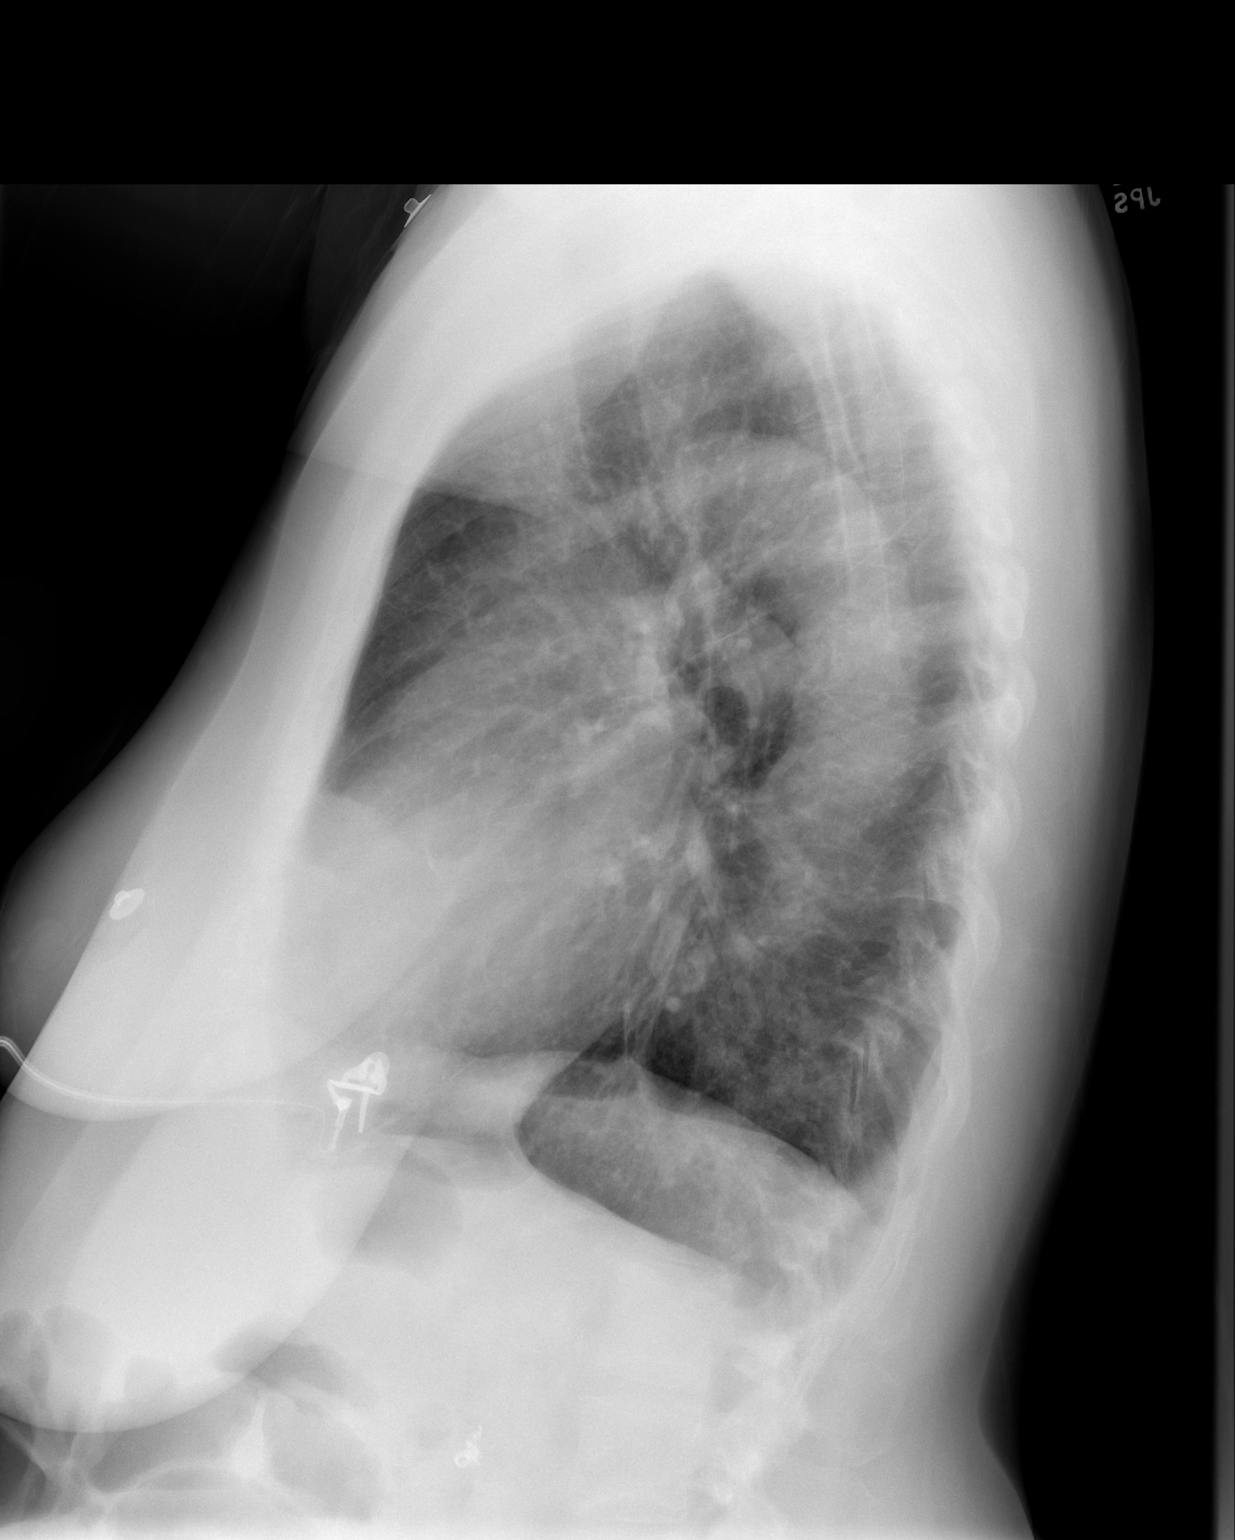

[2 of 2 positions shown; findings below may reference images not displayed]

FINDINGS: Prior exam revealed an opacity in the lingular segment which has resolved.  Interstitial markings are diffusely accentuated as noted previously.  Cardiomegaly.  Some redistribution of pulmonary blood flow to the upper lung zones is compatible with pulmonary venous hypertension.  Lungs are hyperaerated.  Tortuous ectatic thoracic aorta.
IMPRESSION: Cardiomegaly and pulmonary venous hypertension.  Hyperaeration.

## 2008-12-17 ENCOUNTER — Encounter (HOSPITAL_COMMUNITY): Admission: RE | Admit: 2008-12-17 | Discharge: 2009-03-17 | Payer: Self-pay | Admitting: Nephrology

## 2008-12-24 IMAGING — CT CT CHEST W/O CM
2 of 3 series · 16 of 29 positions shown, 19 images · non-contrast
Comparison: CT thorax 11/23/2007

CLINICAL DATA: Right lung nodule, evaluate for change.

CT CHEST WITHOUT CONTRAST
TECHNIQUE: Multidetector CT imaging of the chest was performed
following the standard protocol without IV contrast.

[Series 2: routine chest · axial · 0.70mm/px · z∈[-382,-112]mm · 9 of 70 slices shown, 12 images]
[im 8/70  mediastinal]
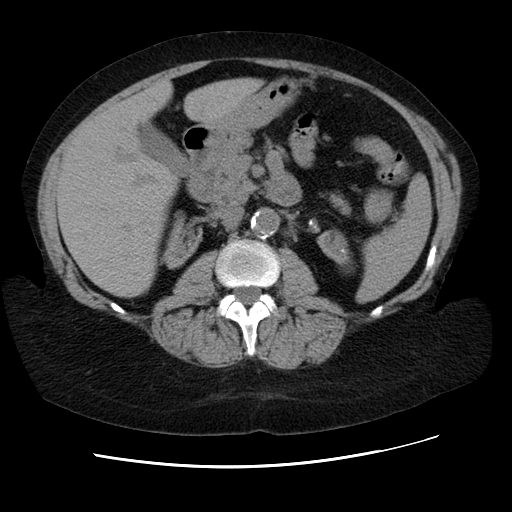
[im 8/70  lung]
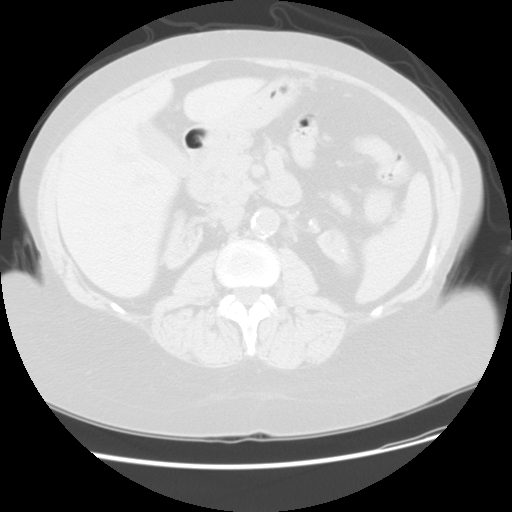
[im 16/70  lung]
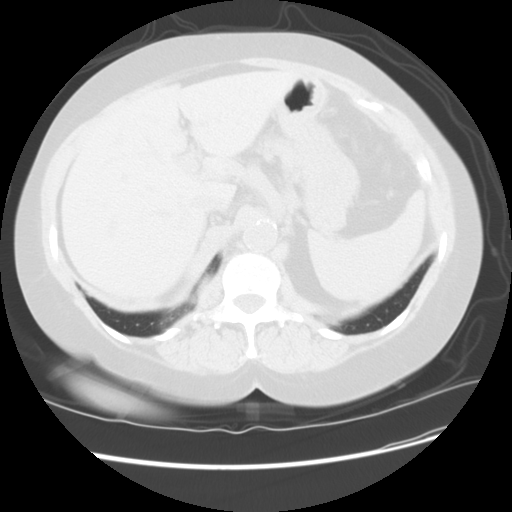
[im 24/70  lung]
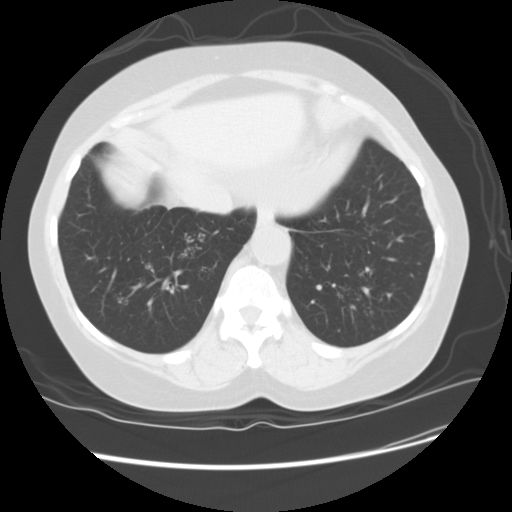
[im 31/70  lung]
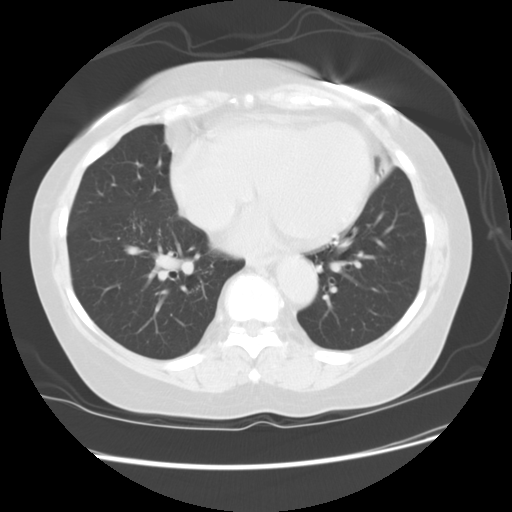
[im 35/70  mediastinal]
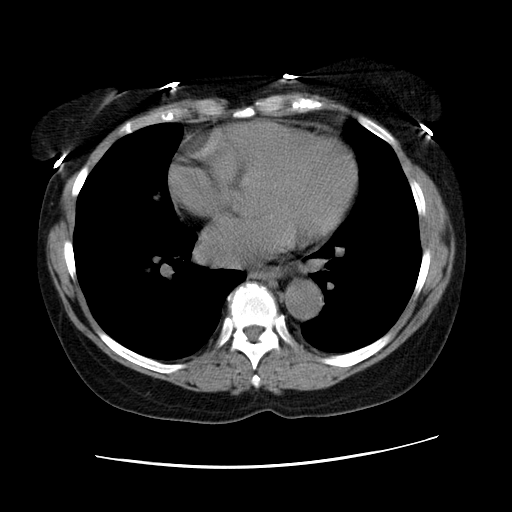
[im 35/70  lung]
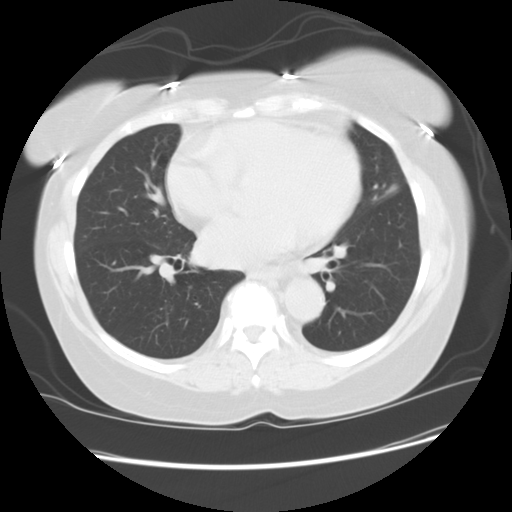
[im 39/70  lung]
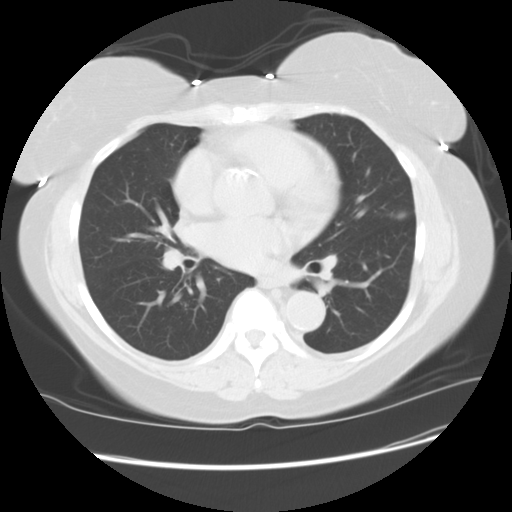
[im 47/70  lung]
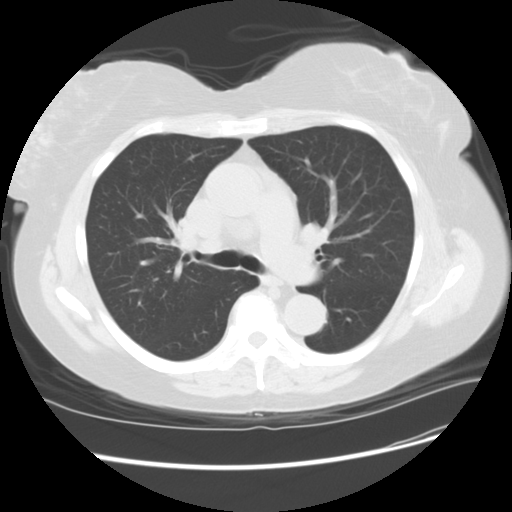
[im 54/70  lung]
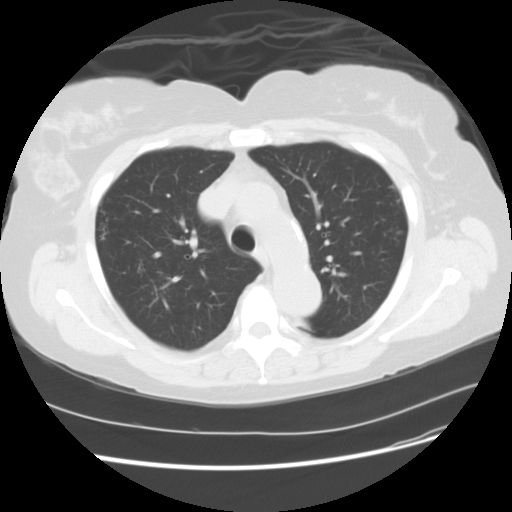
[im 62/70  mediastinal]
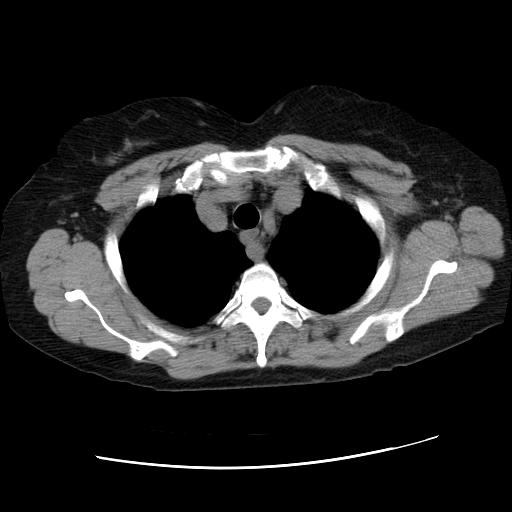
[im 62/70  lung]
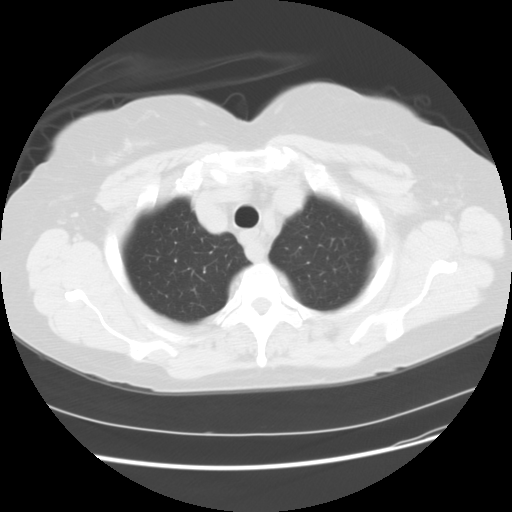

[Series 400: sagittal chest · sagittal · 0.70mm/px · 7 of 76 slices shown]
[im 9/76  lung]
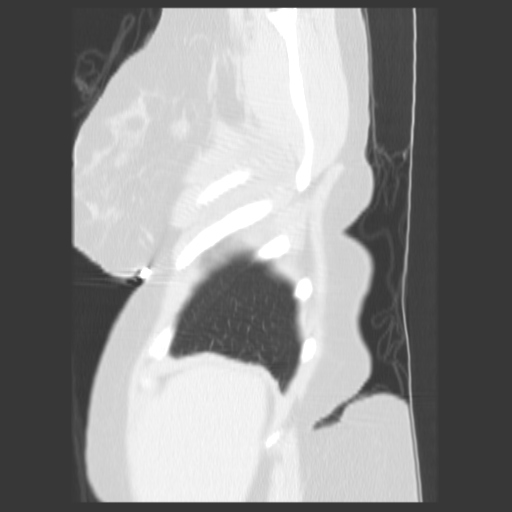
[im 17/76  lung]
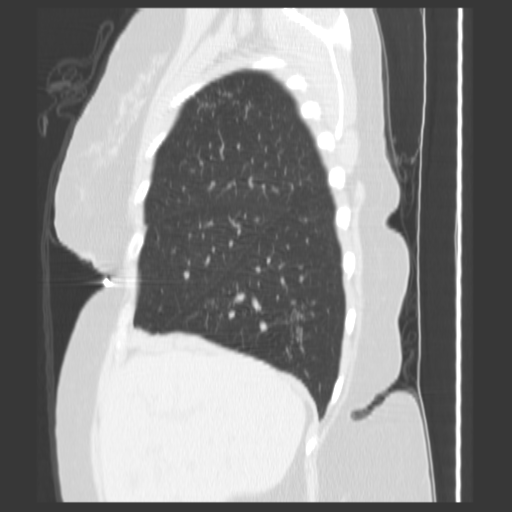
[im 26/76  lung]
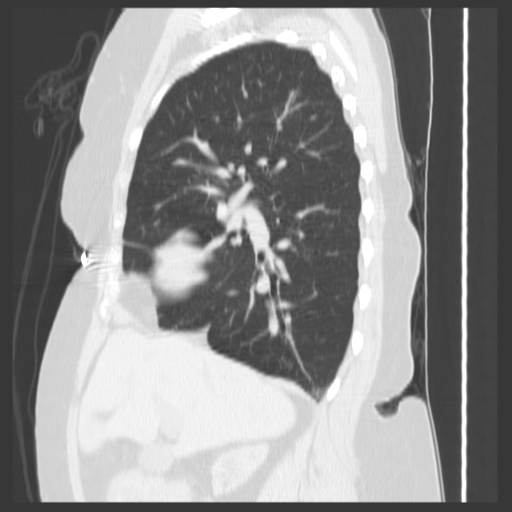
[im 34/76  lung]
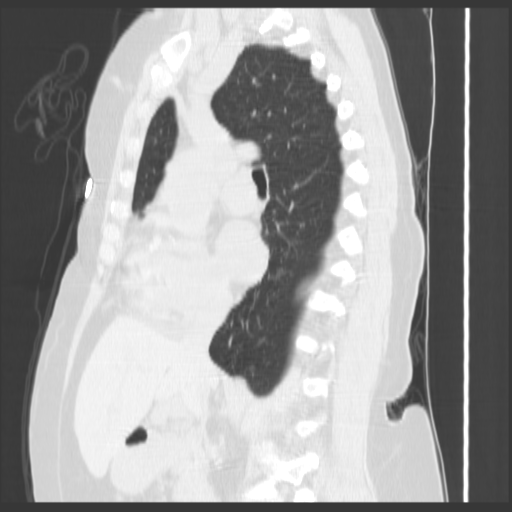
[im 42/76  lung]
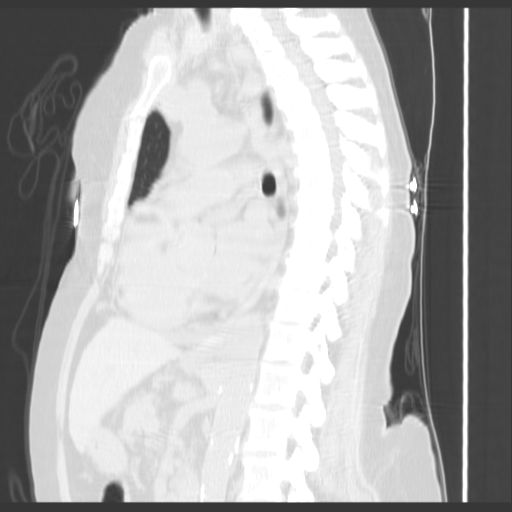
[im 51/76  lung]
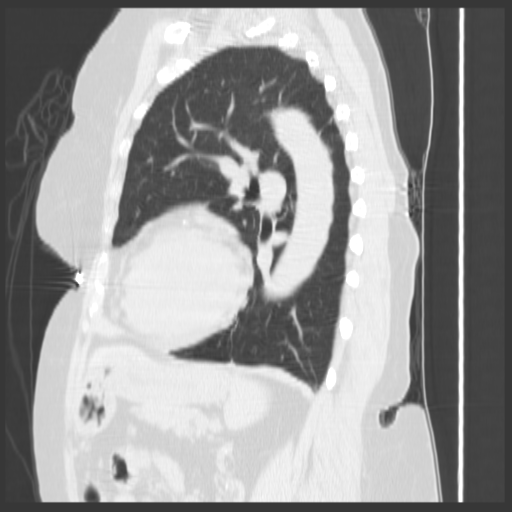
[im 59/76  lung]
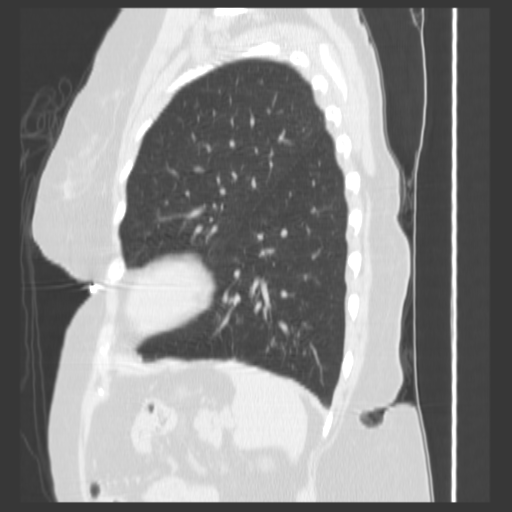

[16 of 29 positions shown; findings below may reference images not displayed]

FINDINGS: Interval resolution of noncalcified pulmonary nodule in
the right upper lobe previously measuring 11 mm.  There is
persistent reticular nodular pattern/tree in bud pattern within the
right upper lobe, right lower lobe, left lower lobe, which is not
significantly changed from prior.  There is improved reticular
pattern within the left upper lobe.  The tracheal  tree appears
normal.  Again demonstrated peripheral bronchiectasis.  12 mm
prevascular precarinal lymph node unchanged from prior.  No
evidence of new adenopathy.

20 mm low density lesion in the right lobe the thyroid gland.  No
supraclavicular adenopathy.  No axillary adenopathy.

Review of the upper abdomen demonstrates atrophic kidneys with
simple left renal cyst.  Adrenal glands appear normal.  No focal
hepatic lesion.

Review of the bone windows demonstrates no aggressive osseous
lesions.
IMPRESSION: 1..  Interval resolution of right upper lobe pulmonary nodule
consist with benign etiology.

2..  Persistent bilateral tree in bud nodular pattern which has
improved in the lingula but  persist in the remaining lobes.
Findings remain most typical for chronic infectious  process.

3.  A  2 cm low density lesion in the right lobe of thyroid gland
could be better evaluated with ultrasound.

## 2009-03-12 ENCOUNTER — Encounter: Admission: RE | Admit: 2009-03-12 | Discharge: 2009-03-12 | Payer: Self-pay | Admitting: Nephrology

## 2009-03-18 ENCOUNTER — Encounter (HOSPITAL_COMMUNITY): Admission: RE | Admit: 2009-03-18 | Discharge: 2009-06-16 | Payer: Self-pay | Admitting: Nephrology

## 2009-04-20 IMAGING — CR DG CHEST 2V
2 series · 2 of 2 positions shown · non-contrast
Comparison: 03/05/2008

CLINICAL DATA: Fever and cough.  Kidney transplant.

CHEST - 2 VIEW

[w chest pa]
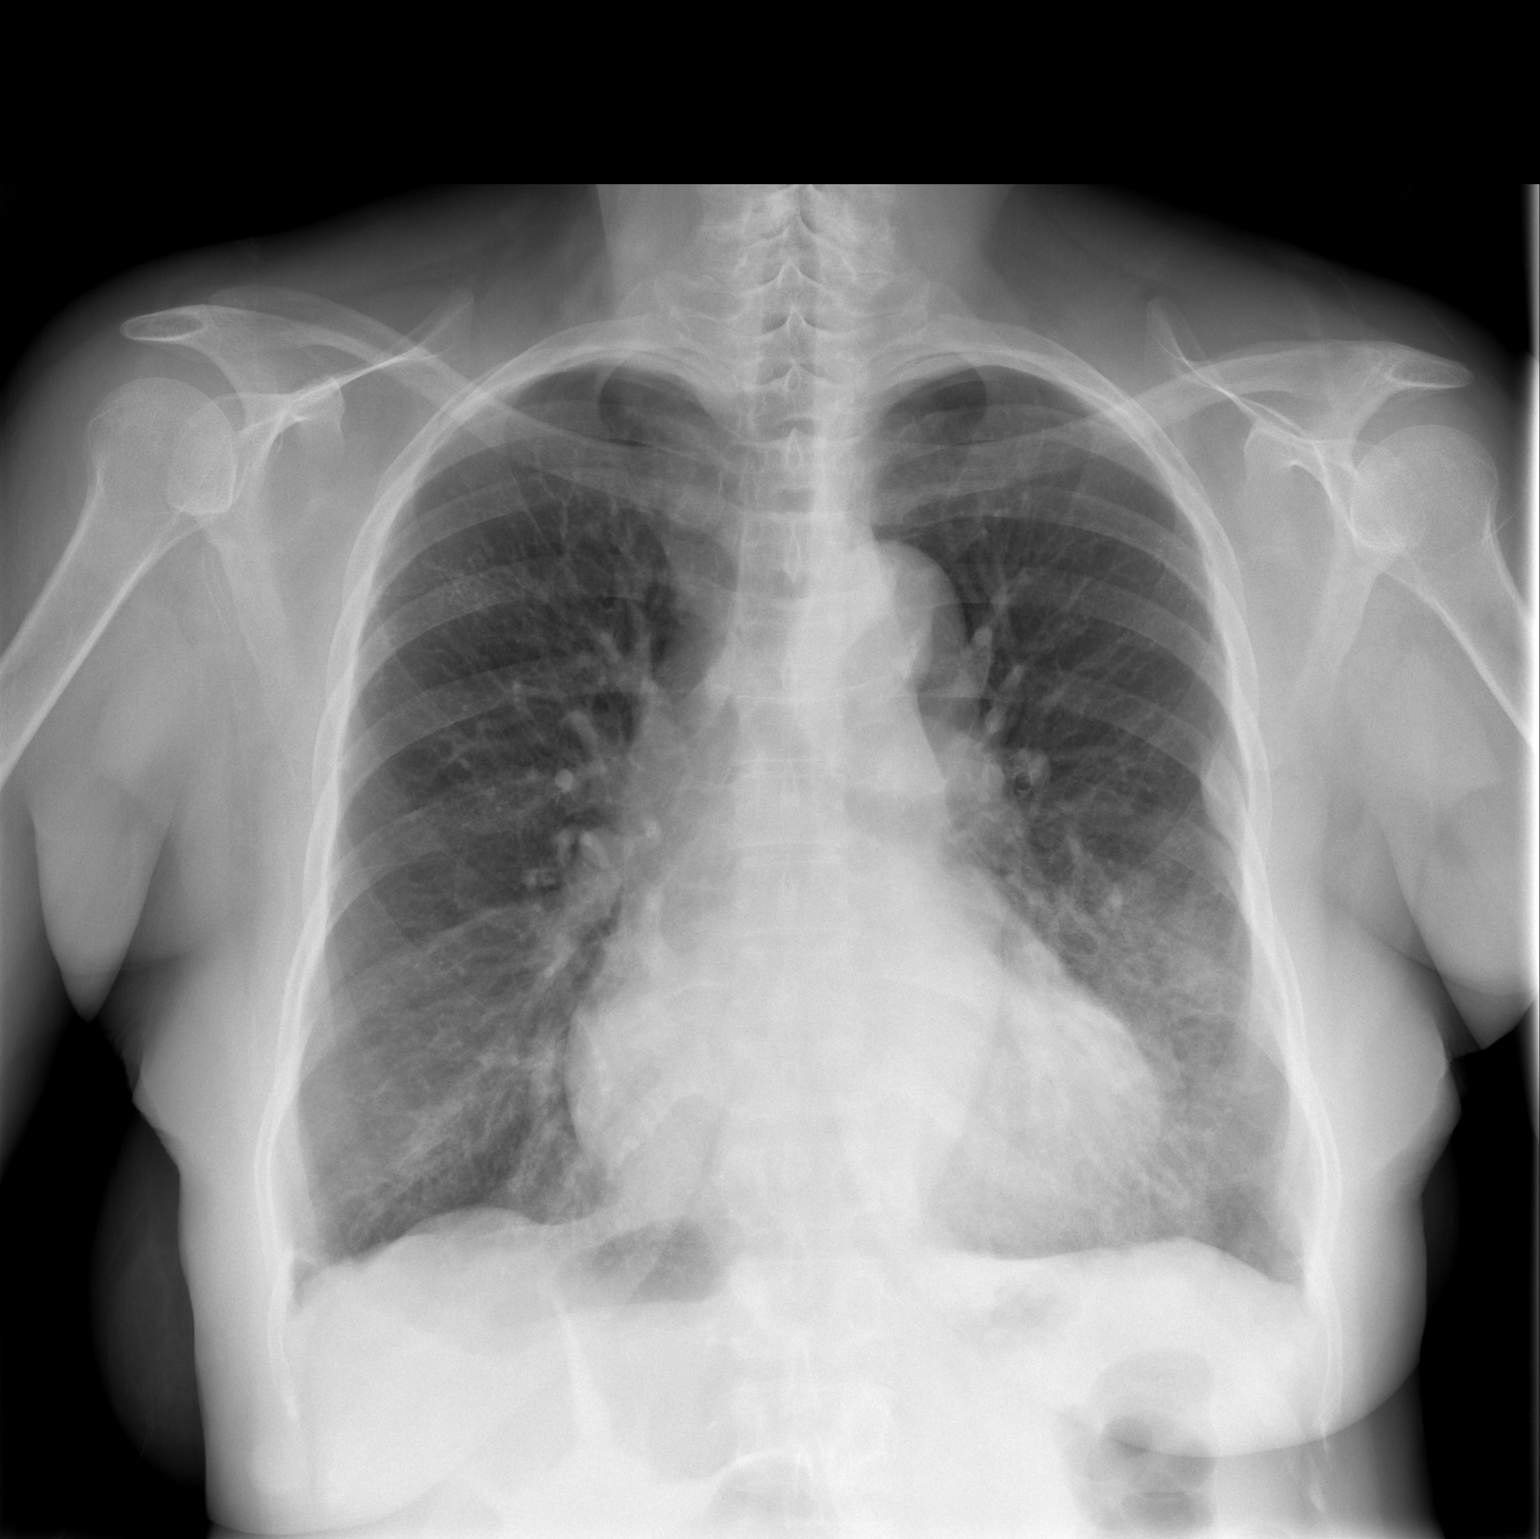

[w chest lat]
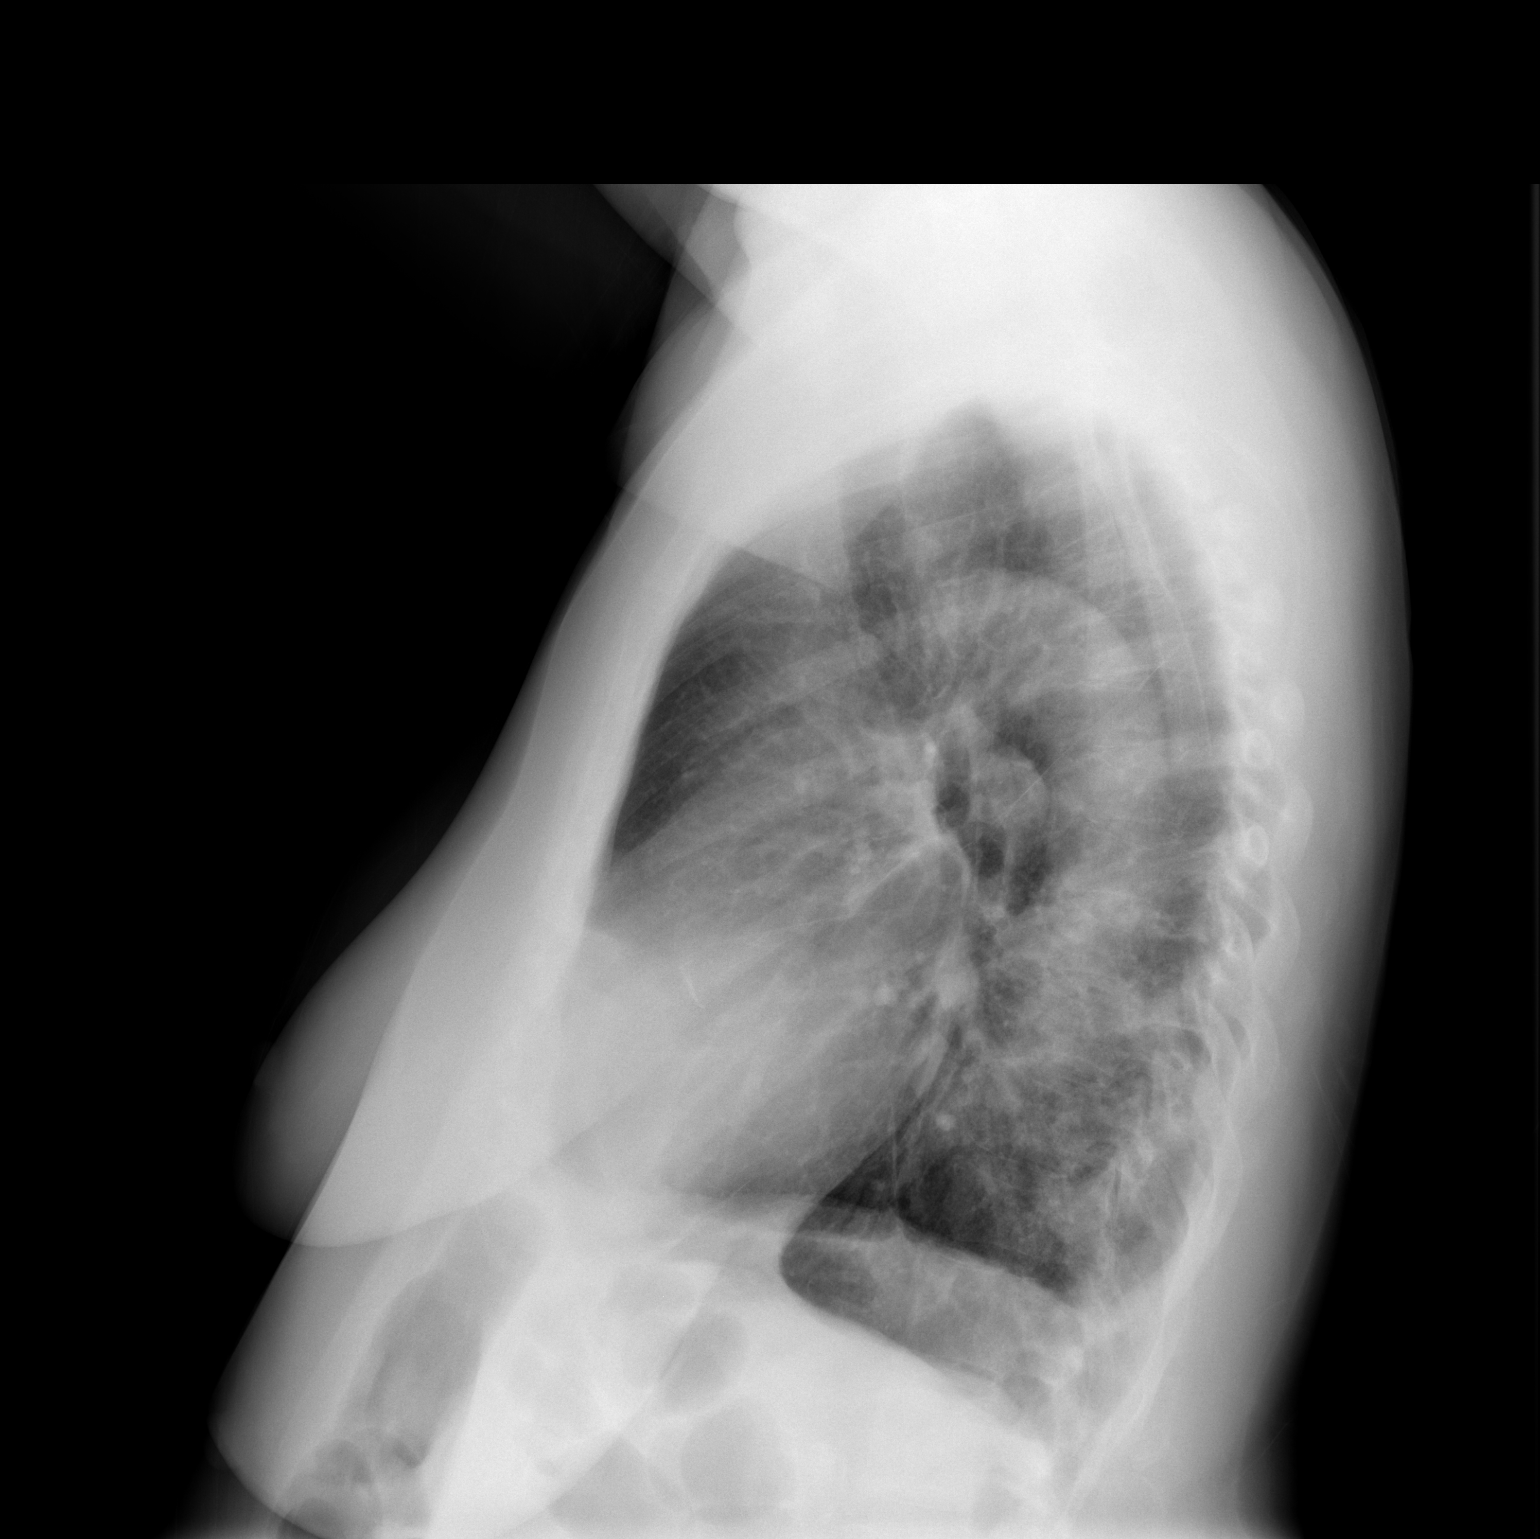

[2 of 2 positions shown; findings below may reference images not displayed]

FINDINGS: There is mild cardiac enlargement.

No pleural effusion or pulmonary interstitial edema noted.

Airspace disease within the superior segment of the left lower lobe
is identified and is worrisome for infection.

The right lung is clear.

Chronic fracture of the left seventh rib is noted.
IMPRESSION: 1.  Suspect pneumonia within the left lower lobe.
2.  Follow-up imaging suggested to ensure complete resolution.

## 2009-06-24 ENCOUNTER — Encounter (HOSPITAL_COMMUNITY): Admission: RE | Admit: 2009-06-24 | Discharge: 2009-09-22 | Payer: Self-pay | Admitting: Nephrology

## 2009-09-23 ENCOUNTER — Encounter (HOSPITAL_COMMUNITY): Admission: RE | Admit: 2009-09-23 | Discharge: 2009-12-22 | Payer: Self-pay | Admitting: Nephrology

## 2009-10-12 ENCOUNTER — Encounter (INDEPENDENT_AMBULATORY_CARE_PROVIDER_SITE_OTHER): Payer: Self-pay | Admitting: Internal Medicine

## 2009-10-12 ENCOUNTER — Inpatient Hospital Stay (HOSPITAL_COMMUNITY): Admission: EM | Admit: 2009-10-12 | Discharge: 2009-10-14 | Payer: Self-pay | Admitting: Emergency Medicine

## 2009-10-14 ENCOUNTER — Encounter (INDEPENDENT_AMBULATORY_CARE_PROVIDER_SITE_OTHER): Payer: Self-pay | Admitting: Internal Medicine

## 2009-11-15 ENCOUNTER — Encounter: Admission: RE | Admit: 2009-11-15 | Discharge: 2009-11-15 | Payer: Self-pay | Admitting: Nephrology

## 2009-11-29 ENCOUNTER — Encounter: Admission: RE | Admit: 2009-11-29 | Discharge: 2009-11-29 | Payer: Self-pay | Admitting: Nephrology

## 2009-12-23 ENCOUNTER — Encounter (HOSPITAL_COMMUNITY): Admission: RE | Admit: 2009-12-23 | Discharge: 2010-03-23 | Payer: Self-pay | Admitting: Nephrology

## 2010-04-07 ENCOUNTER — Encounter (HOSPITAL_COMMUNITY): Admission: RE | Admit: 2010-04-07 | Discharge: 2010-07-06 | Payer: Self-pay | Admitting: Nephrology

## 2010-05-28 ENCOUNTER — Encounter (INDEPENDENT_AMBULATORY_CARE_PROVIDER_SITE_OTHER): Payer: Self-pay | Admitting: *Deleted

## 2010-06-24 IMAGING — CR DG CHEST 2V
2 series · 2 of 2 positions shown · non-contrast
Comparison: 09/11/2008.  CT of 05/17/2009.

CLINICAL DATA: Hemoptysis for 4 days.  Kidney transplant in [id number].
Ex-smoker.

CHEST - 2 VIEW

[w chest pa]
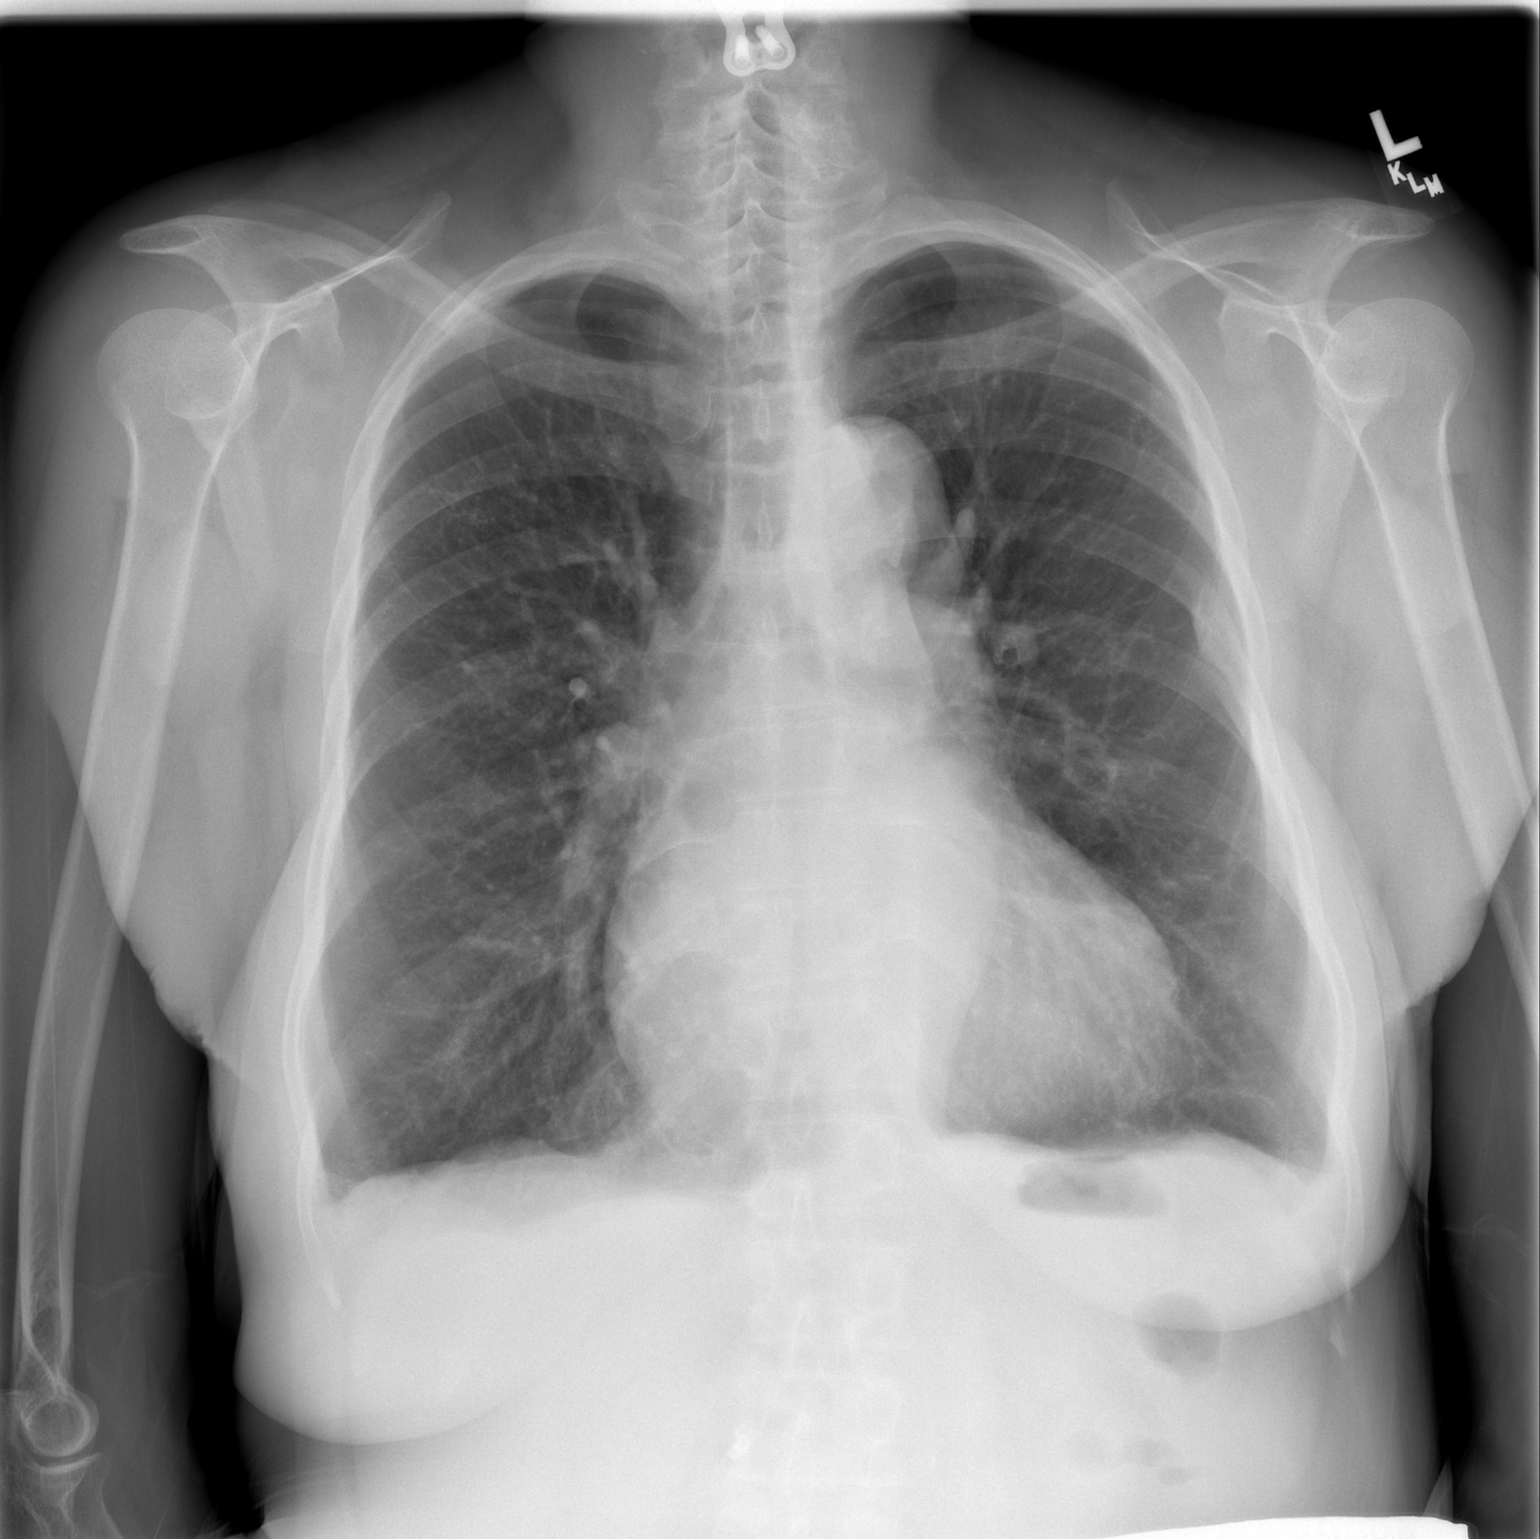

[w chest lat]
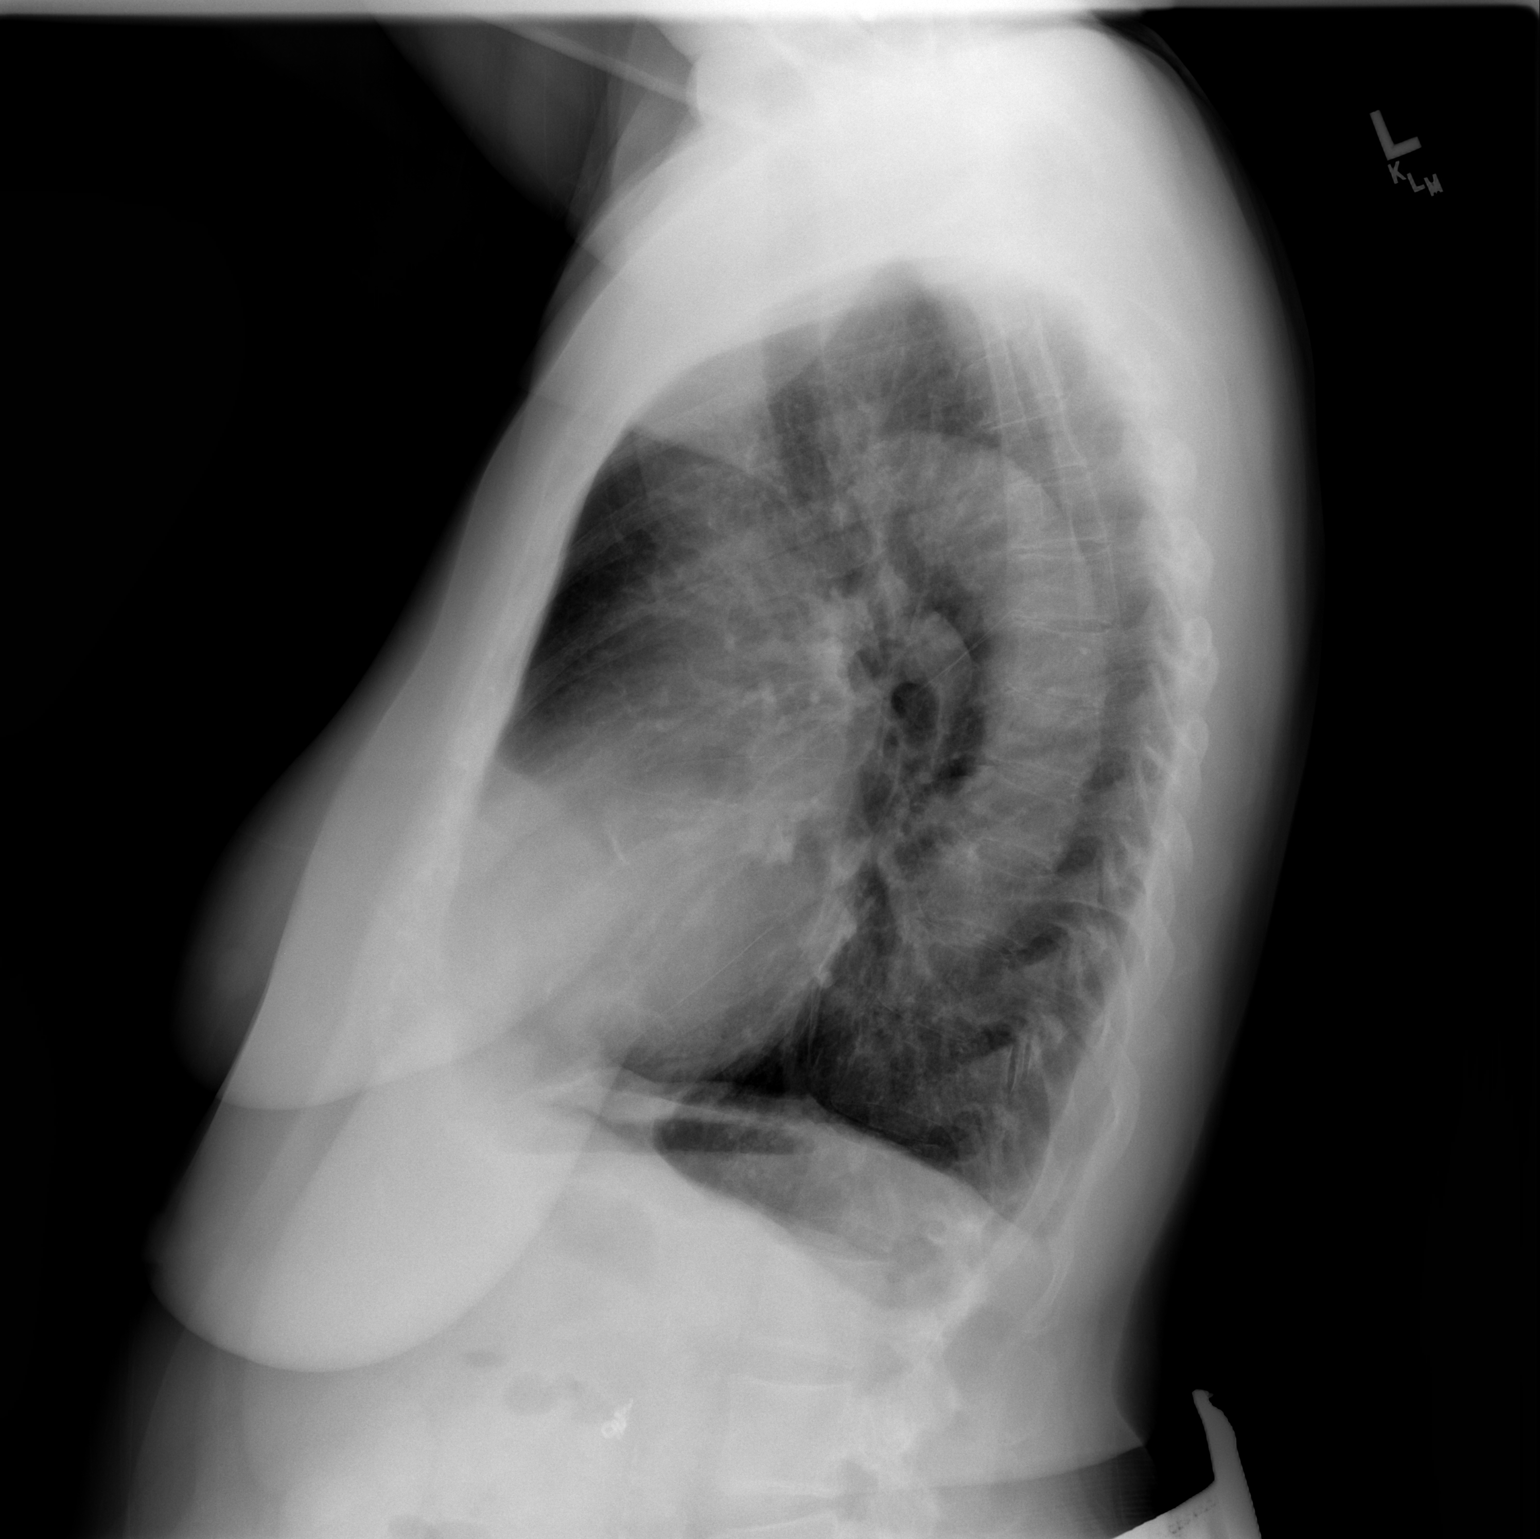

[2 of 2 positions shown; findings below may reference images not displayed]

FINDINGS: Prior mid cervical spine fixation.  Remote left rib
trauma.

Midline trachea.  Mild cardiomegaly. Mediastinal contours otherwise
within normal limits.  Bilateral costophrenic angle blunting on the
frontal is likely related to pleural thickening. No pneumothorax.
No congestive failure.

Diffuse peribronchial thickening.  Clear lungs.
IMPRESSION: 1. No acute cardiopulmonary disease.
2. Cardiomegaly without congestive failure.
3. Peribronchial thickening which may relate to chronic bronchitis
or smoking.

## 2010-07-07 ENCOUNTER — Encounter (HOSPITAL_COMMUNITY): Admission: RE | Admit: 2010-07-07 | Discharge: 2010-10-05 | Payer: Self-pay | Admitting: Nephrology

## 2010-07-08 IMAGING — US US ABDOMEN COMPLETE
1 series · 13 of 25 positions shown · non-contrast
Comparison: None.

CLINICAL DATA: Upper abdominal pain, elevated amylase and lipase

COMPLETE ABDOMINAL ULTRASOUND

[Series 1: us abdomen complete · 0.24mm/px · 13 of 81 slices shown]
[im 1/81]
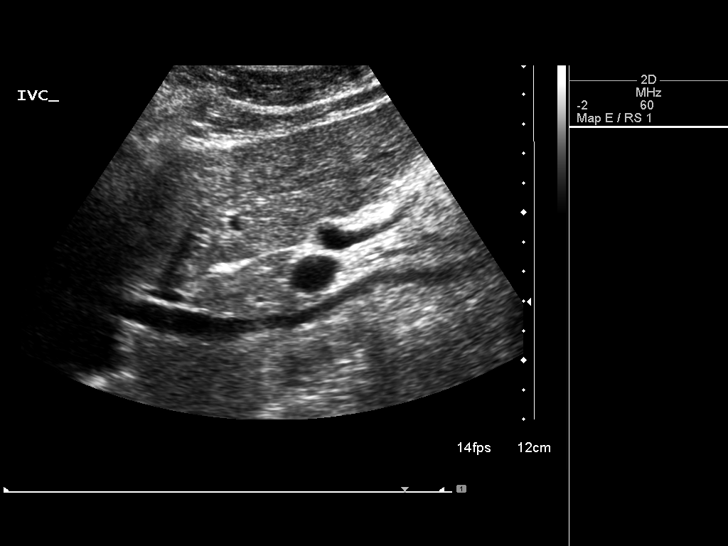
[im 7/81]
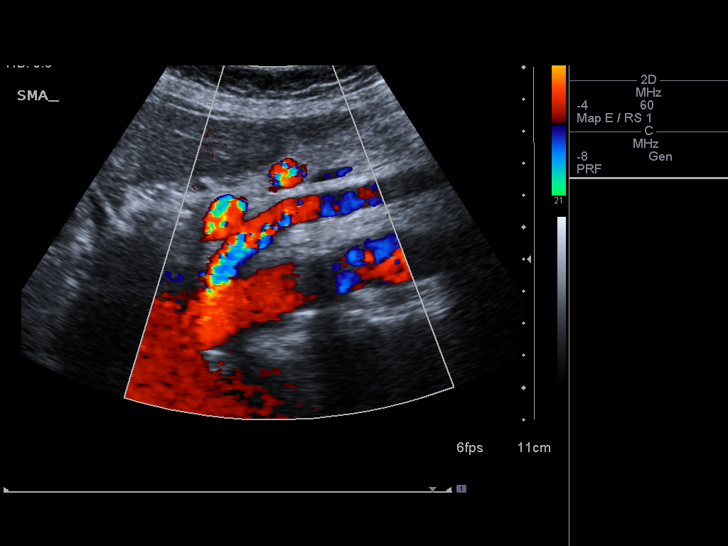
[im 14/81]
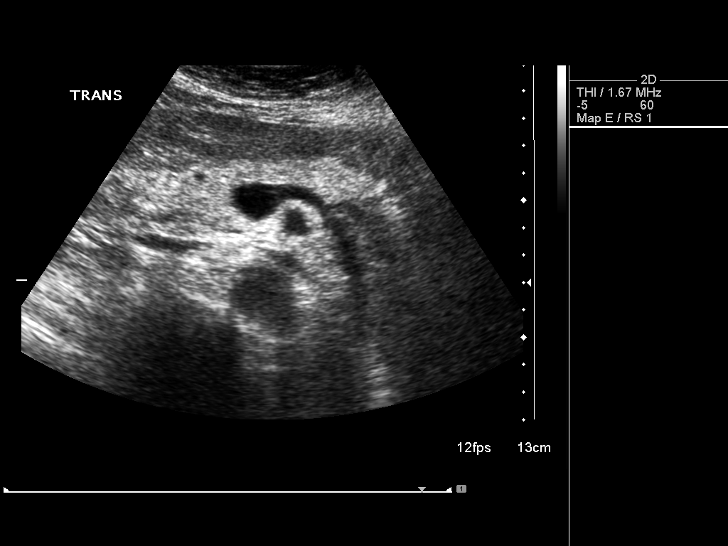
[im 21/81]
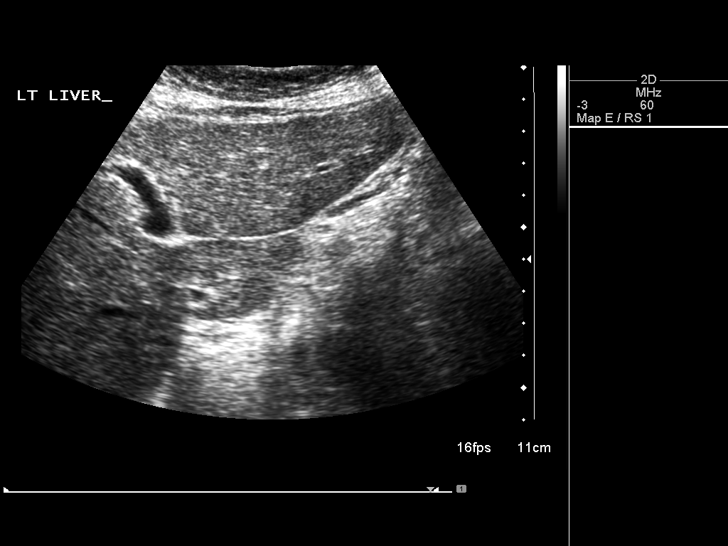
[im 27/81]
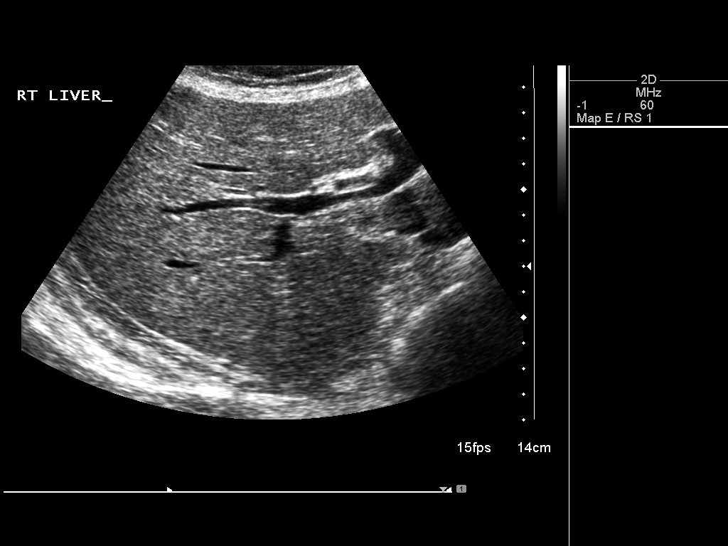
[im 34/81]
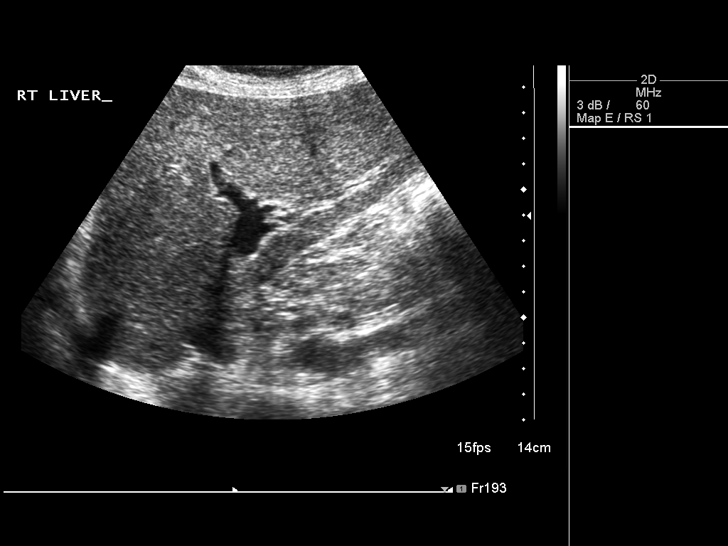
[im 41/81]
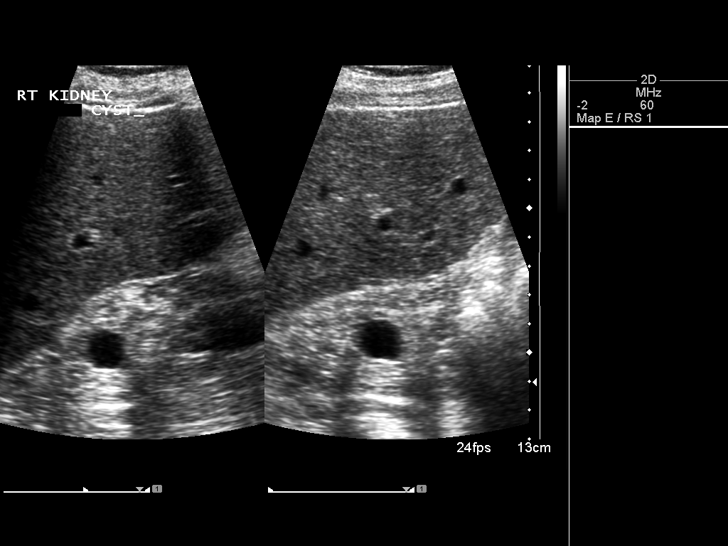
[im 47/81]
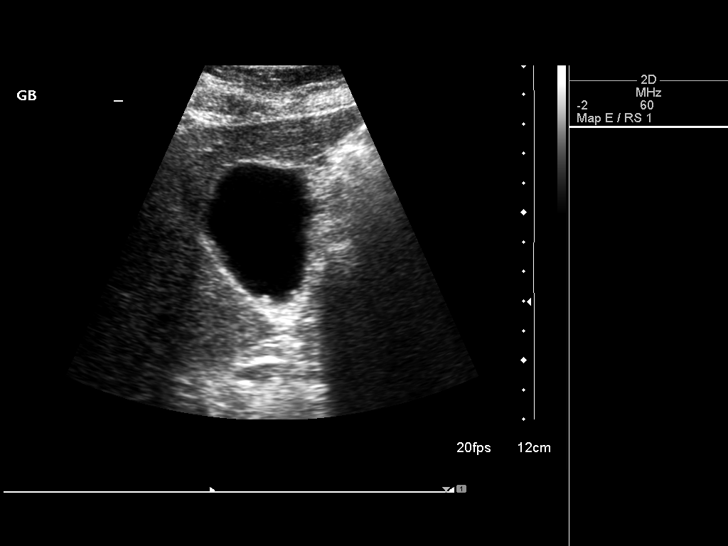
[im 54/81]
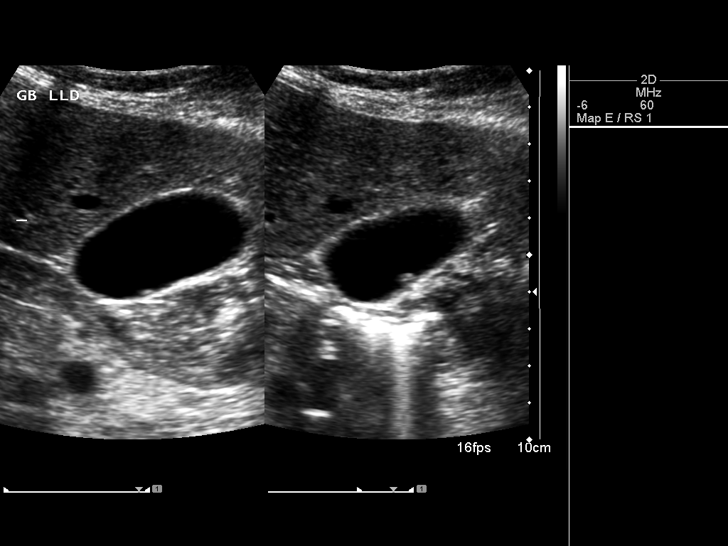
[im 61/81]
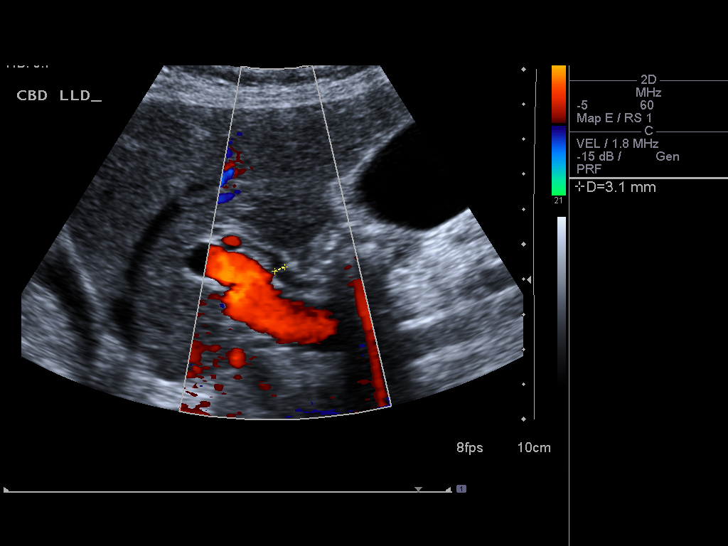
[im 67/81]
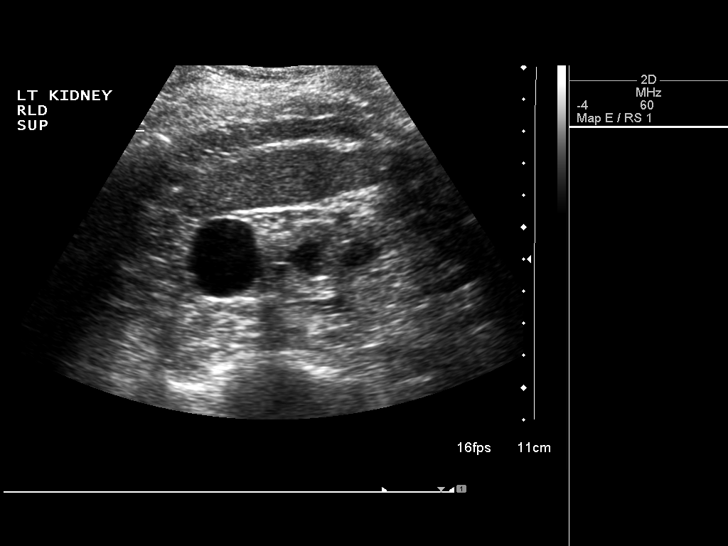
[im 74/81]
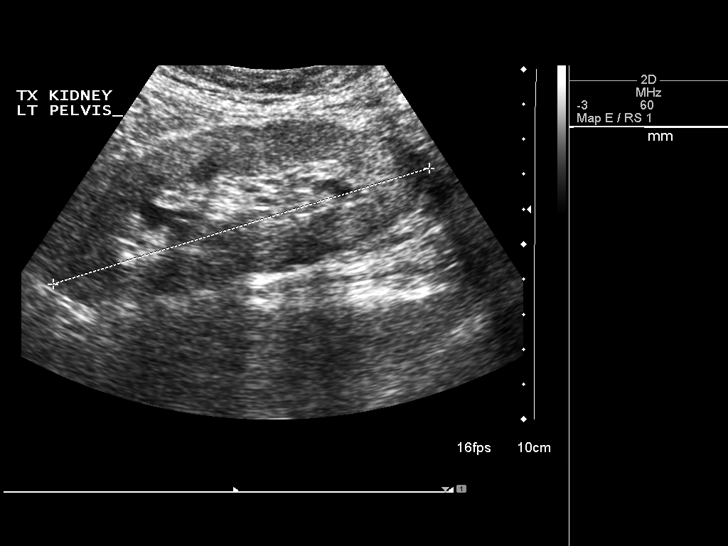
[im 81/81]
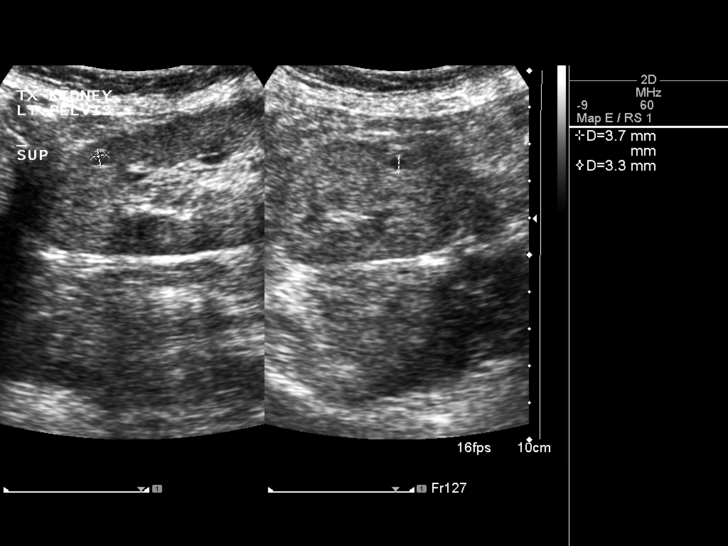

[13 of 25 positions shown; findings below may reference images not displayed]

FINDINGS: Gallbladder:  Two small echogenic foci are noted within the
gallbladder which do not shadow and do not move and are most
consistent with small gallbladder polyps.  No gallstones are seen.

Common bile duct:  Common bile duct is normal measuring 3.1 mm in
diameter.

Liver:  The liver has a normal echogenic pattern.  No ductal
dilatation is seen.

IVC:  Appears normal.

Pancreas:  No focal abnormality seen.

Spleen:  The spleen is normal measuring 7.6 cm sagittally.

Right Kidney:  The right kidney is atrophic and echogenic
consistent with chronic renal medical disease.

Left Kidney:  The left kidney is atrophic and echogenic consistent
with chronic renal medical disease.

Abdominal aorta:  Atheromatous plaque is noted.  No abdominal
aortic aneurysm is seen.

The does appear to be a small pericardial effusion present.

Transplanted kidney is noted in the in the left pelvis measuring
11.2 cm sagittally.
IMPRESSION: 1.  Small gallbladder polyps.  No gallstones.
2.  Echogenic atrophic  native kidneys with transplanted kidney in
the left pelvis.
3.  Small pericardial effusion.

## 2010-10-06 ENCOUNTER — Encounter (HOSPITAL_COMMUNITY)
Admission: RE | Admit: 2010-10-06 | Discharge: 2011-01-04 | Payer: Self-pay | Source: Home / Self Care | Attending: Nephrology | Admitting: Nephrology

## 2010-10-21 ENCOUNTER — Telehealth: Payer: Self-pay | Admitting: Internal Medicine

## 2010-10-21 ENCOUNTER — Ambulatory Visit: Payer: Self-pay | Admitting: Physician Assistant

## 2010-10-21 LAB — CONVERTED CEMR LAB
Basophils Absolute: 0 10*3/uL (ref 0.0–0.1)
Basophils Relative: 0.4 % (ref 0.0–3.0)
Eosinophils Relative: 1.4 % (ref 0.0–5.0)
Hemoglobin: 10.5 g/dL — ABNORMAL LOW (ref 12.0–15.0)
Lymphocytes Relative: 18.7 % (ref 12.0–46.0)
MCV: 93.8 fL (ref 78.0–100.0)
RDW: 15.9 % — ABNORMAL HIGH (ref 11.5–14.6)
WBC: 6.2 10*3/uL (ref 4.5–10.5)

## 2010-10-22 ENCOUNTER — Inpatient Hospital Stay (HOSPITAL_COMMUNITY): Admission: EM | Admit: 2010-10-22 | Discharge: 2010-10-25 | Payer: Self-pay | Admitting: Emergency Medicine

## 2010-10-22 ENCOUNTER — Ambulatory Visit: Payer: Self-pay | Admitting: Gastroenterology

## 2010-10-22 ENCOUNTER — Telehealth: Payer: Self-pay | Admitting: Nurse Practitioner

## 2010-10-24 ENCOUNTER — Encounter: Payer: Self-pay | Admitting: Gastroenterology

## 2010-10-27 ENCOUNTER — Encounter: Payer: Self-pay | Admitting: Gastroenterology

## 2010-12-31 LAB — POCT HEMOGLOBIN-HEMACUE: Hemoglobin: 8 g/dL — ABNORMAL LOW (ref 12.0–15.0)

## 2011-01-04 ENCOUNTER — Encounter: Payer: Self-pay | Admitting: Neurological Surgery

## 2011-01-12 ENCOUNTER — Encounter (HOSPITAL_COMMUNITY)
Admission: RE | Admit: 2011-01-12 | Discharge: 2011-01-13 | Payer: Self-pay | Source: Home / Self Care | Attending: Nephrology | Admitting: Nephrology

## 2011-01-13 LAB — CROSSMATCH

## 2011-01-13 NOTE — Procedures (Signed)
Summary: Colonoscopy    Patient Name: Taytem, Papworth MRN:  Procedure Procedures: Colonoscopy CPT: [id number].    with polypectomy. CPT: [id number].  Personnel: Endoscopist: Dora L. Olevia Perches, MD.  Exam Location: Exam performed in Outpatient Clinic. Outpatient  Patient Consent: Procedure, Alternatives, Risks and Benefits discussed, consent obtained, from patient. Consent was obtained by the RN.  Indications  Evaluation of: Anemia with low iron saturation. Positive fecal occult blood test per digital rectal exam.  History  Current Medications: Patient is not currently taking Coumadin.  Pre-Exam Physical: Performed Jul 03, 2005. Entire physical exam was normal.  Exam Exam: Extent of exam reached: Cecum, extent intended: Cecum.  The cecum was identified by appendiceal orifice and IC valve. Colon retroflexion performed. Images taken. ASA Classification: II. Tolerance: good.  Monitoring: Pulse and BP monitoring, Oximetry used. Supplemental O2 given.  Colon Prep Used Miralax for colon prep. Prep results: good.  Sedation Meds: Patient assessed and found to be appropriate for moderate (conscious) sedation. Fentanyl 100 mcg. given IV. Versed 7 mg. given IV.  Findings - OTHER FINDING: Cecum. Comments: hyperemic mucosa of the right colon, most likely due to steroids.  - DIVERTICULOSIS: Descending Colon to Sigmoid Colon. ICD9: Diverticulosis, Colon: 562.10. Comments: mild diverticulosis.  POLYP: Rectum, Maximum size: 10 mm. pedunculated polyp. Distance from Anus 5 cm. Procedure:  snare with cautery/saline, removed, retrieved, Polyp sent to pathology. ICD9: Colon Polyps: 211.3.  - HEMORRHOIDS: Internal and External. Size: Grade II. ICD9: Hemorrhoids, Internal and  External: 455.6.   Assessment Abnormal examination, see findings above.  Diagnoses: 211.3: Colon Polyps.  455.6: Hemorrhoids, Internal and  External.  562.10: Diverticulosis, Colon.   Comments: s/p rectal polypectomy,  large hemorrhoids Events  Unplanned Interventions: No intervention was required.  Unplanned Events: There were no complications. Plans Medication Plan: Await pathology. Hemorrhoidal Medications: Anusol Suppositories 1 HS, starting Jul 03, 2005   Patient Education: Patient given standard instructions for: Hemorrhoids. Yearly hemoccult testing recommended. Patient instructed to get routine colonoscopy every 3-5 years.  Disposition: After procedure patient sent to recovery. After recovery patient sent home.    cc:  Erling Cruz, MD      Jory Ee. Sherren Mocha, MD  This report was created from the original endoscopy report, which was reviewed and signed by the above listed endoscopist.

## 2011-01-13 NOTE — Letter (Signed)
Summary: Colonoscopy Letter  [facility]  [place], [place] [address]   Phone: [phone]  Fax: [phone]      May 28, 2010 MRN: [id number]   [facility] [address] [place], [place]  [address]   Dear Ms. West,   According to your medical record, it is time for you to schedule a Colonoscopy. The American Cancer Society recommends this procedure as a method to detect early colon cancer. Patients with a family history of colon cancer, or a personal history of colon polyps or inflammatory bowel disease are at increased risk.  This letter has beeen generated based on the recommendations made at the time of your procedure. If you feel that in your particular situation this may no longer apply, please contact our office.  Please call our office at [phone] to schedule this appointment or to update your records at your earliest convenience.  Thank you for cooperating with [place] to provide you with the very best care possible.   Sincerely,  Lowella Bandy. Olevia Perches, M.D.  [facility] [phone]

## 2011-01-13 NOTE — Procedures (Signed)
Summary: EGD   Patient Name: [name], [name]. MRN:  Procedure Procedures: Panendoscopy (EGD) CPT: A5739879.    with PolypectomyCPT: [id number].  Personnel: Endoscopist: Dora L. Olevia Perches, MD.  Exam Location: Exam performed in Outpatient Clinic. Outpatient  Patient Consent: Procedure, Alternatives, Risks and Benefits discussed, consent obtained, from patient. Consent was obtained by the RN.  Indications  Evaluation of: Anemia,  with low iron saturation. Positive fecal occult blood test per digital rectal exam.  History  Current Medications: Patient is not currently taking Coumadin.  Pre-Exam Physical: Performed Jul 03, 2005  Entire physical exam was normal.  Exam Exam Info: Maximum depth of insertion Duodenum, intended Duodenum. Vocal cords visualized. Gastric retroflexion performed. Images taken. ASA Classification: II. Tolerance: good.  Sedation Meds: Patient assessed and found to be appropriate for moderate (conscious) sedation. Versed 1 mg. given IV. Cetacaine Spray 2 sprays given aerosolized.  Monitoring: BP and pulse monitoring done. Oximetry used. 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 ICD9: Hernia, Hiatal: 553.3. - Normal: Cardia to Fundus.  POLYP: in Antrum. Maximum size: 10 mm. pedunculated polyp. Procedure: snare with cautery/saline, removed, Polyp retrieved, sent to pathology. ICD9: . Comments: polyp shows stigmata of bleeding.  - Normal: Duodenal Apex.   Assessment Abnormal examination, see findings above.  Diagnoses: .  553.3: Hernia, Hiatal.   Comments: s/p gastric polypectomy, antral polyp Events  Unplanned Intervention: No unplanned interventions were required.  Unplanned Events: There were no complications. Plans Medication(s): Await pathology. H2Blocker: Pepcid/Famotidine 40 mg QD, starting Jul 03, 2005   Comments: depending on the polyp histology, follow up EGD in 2-3 years, bleeding possibly related  to a low grade blood loss from a gastric polyp Disposition: After procedure patient sent to recovery. After recovery patient sent home.    cc:  Erling Cruz, MD         Jory Ee. Sherren Mocha, MD  This report was created from the original endoscopy report, which was reviewed and signed by the above listed endoscopist.

## 2011-01-13 NOTE — Progress Notes (Signed)
Summary: Triage  Phone Note Call from Patient Call back at Home Phone [phone]   Caller: Patient Call For: Dr. Olevia Perches Reason for Call: Talk to Nurse Summary of Call: Severe rectal bleeding "clotting"  Initial call taken by: Webb Laws,  October 21, 2010 10:03 AM  Follow-up for Phone Call        Patient called to repor that last night she had "profuse bleeding" from rectum. She felt like she had to go to the bathroom. She strained and was constipated. Reports dark red blood mixed with stool and clots followed by bright red blood. Only episode of bleeding was this one time. She did not go to ER because her husband is a Hospice patient and could not be left alone. She had a BM this AM with no blood and  it was normal in color.She reports she is a kidney transplant patient. She is not on blood thinners that she is aware of. Spoke with Nicoletta Ba, PA re: patient. Scheduled her for CBC today and to see Tye Savoy, RNP on 10/22/10 @ 3:30 PM. Patient aware. She will call back if further bleeding occurs. She understands that if she has bleeding tonight to seek treatment at ER. Follow-up by: Leone Payor RN,  October 21, 2010 10:35 AM

## 2011-01-13 NOTE — Procedures (Signed)
Summary: Colonoscopy  Patient: [name] Note: All result statuses are Final unless otherwise noted.  Tests: (1) Colonoscopy (COL)   COL Colonoscopy           [facility]     [place], [place]  [address]           COLONOSCOPY PROCEDURE REPORT           PATIENT:  [name], [name]  MR#:  [id number]     BIRTHDATE:  04-25-1951, 59 yrs. old  GENDER:  female     ENDOSCOPIST:  Norberto Sorenson T. Fuller Plan, MD, [facility]           PROCEDURE DATE:  10/24/2010     PROCEDURE:  Colonoscopy with snare polypectomy     ASA CLASS:  Class II     INDICATIONS:  1) Gastrointestinal hemorrhage  2) history of     pre-cancerous (adenomatous) colon polyps: 06/2005     MEDICATIONS:   Fentanyl 75 mcg IV, Versed 7.5 mg IV     DESCRIPTION OF PROCEDURE:   After the risks benefits and     alternatives of the procedure were thoroughly explained, informed     consent was obtained.  Digital rectal exam was performed and     revealed no abnormalities.   The [id number] endoscope was     introduced through the anus and advanced to the cecum, which was     identified by both the appendix and ileocecal valve, without     limitations.  The quality of the prep was excellent, using     Nulytley.  The instrument was then slowly withdrawn as the colon     was fully examined.     <<PROCEDUREIMAGES>>     FINDINGS:  Moderate diverticulosis was found in the sigmoid colon.     A sessile polyp was found in the rectum. It was 14 mm in size.     Polyp was snared, then cauterized with monopolar cautery.     Piecemeal polypectomy that appeared to be complete. Retrieval was     successful.  A normal appearing cecum, ileocecal valve, and     appendiceal orifice were identified. The ascending, hepatic     flexure, transverse, splenic flexure, descending colon, and rectum     appeared unremarkable.  Retroflexed views in the rectum revealed     no abnormalities.   The time to cecum =  2   minutes. The scope was     then withdrawn (time =  11  min) from the patient and the     procedure completed.     COMPLICATIONS:  None           ENDOSCOPIC IMPRESSION:     1) Moderate diverticulosis in the sigmoid colon     2) 14 mm sessile polyp in the rectum           RECOMMENDATIONS:     1) Hold aspirin, aspirin products, and anti-inflamatory     medication for 2 weeks.     2) Await pathology results     3) High fiber diet with liberal fluid intake.     4) Repeat Colonoscopy in 2-3 years with Dr. Olevia Perches pending     pathology review     5) Flex Sig in 6 months with Dr. Olevia Perches to assess completeness of     polypectomy pending pathology review  Pricilla Riffle. Fuller Plan, MD, [facility]           CC: Delfin Edis, MD, Dorena Cookey, MD           n.     Lorrin MaisPricilla Riffle. Tima Curet at 10/24/2010 01:16 PM           Adelfa Koh, [id number]  Note: An exclamation mark (!) indicates a result that was not dispersed into the flowsheet. Document Creation Date: 10/24/2010 1:16 PM _______________________________________________________________________  (1) Order result status: Final Collection or observation date-time: 10/24/2010 13:07 Requested date-time:  Receipt date-time:  Reported date-time:  Referring Physician:   Ordering Physician: Lucio Edward [phone]) Specimen Source:  Source: Tawanna Cooler Order Number: [id number] Lab site:

## 2011-01-13 NOTE — Progress Notes (Signed)
Summary: ER visit/rectal bleeding  Phone Note Call from Patient Call back at [phone] (cell)   Caller: Patient Call For: Tye Savoy, NP Reason for Call: Talk to Nurse Summary of Call: pt going to ER this morning for increased rectal bleeding... will not be coming in for appt today... would like a f/u call from nurse at some point Initial call taken by: Lucien Mons,  October 22, 2010 9:55 AM  Follow-up for Phone Call        Sent myself a flag for THurs 10-23-10.  Check to see if ER admitted pt. She went to ER on Wed 10-22-10 for rectal bleeding.  Pt wanted [place] to follow up with her by phone. Follow-up by: Sharol Roussel,  October 22, 2010 10:00 AM

## 2011-01-13 NOTE — Letter (Signed)
Summary: Patient Notice- Polyp Results  [facility]  [address] [place], [place] [address]   Phone: [phone]  Fax: [phone]        October 27, 2010 MRN: [id number]    [facility] [address] [place], [place]  [address]    Dear Ms. Gasner,  I am pleased to inform you that the colon polyp(s) removed during your recent colonoscopy was (were) found to be benign (no cancer detected) upon pathologic examination.  I recommend you have a sigmoidoscopy examination in 6 month to assess for complete removal of the polyp.  Should you develop new or worsening symptoms of abdominal pain, bowel habit changes or bleeding from the rectum or bowels, please schedule an evaluation with either your primary care physician or with me.  Continue treatment plan as outlined the day of your exam.  Please call us if you are having persistent problems or have questions about your condition that have not been fully answered at this time.  Sincerely,  Ladene Artist MD [facility]  This letter has been electronically signed by your physician.  Appended Document: Patient Notice- Polyp Results Letter mailed to patient.

## 2011-01-14 LAB — CROSSMATCH
ABO/RH(D): O POS
Antibody Screen: NEGATIVE
Unit division: 0

## 2011-01-26 ENCOUNTER — Other Ambulatory Visit: Payer: Self-pay

## 2011-01-26 ENCOUNTER — Encounter (HOSPITAL_COMMUNITY): Payer: Medicare Other | Attending: Nephrology

## 2011-01-26 DIAGNOSIS — N184 Chronic kidney disease, stage 4 (severe): Secondary | ICD-10-CM | POA: Insufficient documentation

## 2011-01-26 DIAGNOSIS — D638 Anemia in other chronic diseases classified elsewhere: Secondary | ICD-10-CM | POA: Insufficient documentation

## 2011-01-28 ENCOUNTER — Encounter (HOSPITAL_COMMUNITY): Payer: Medicare Other

## 2011-01-30 ENCOUNTER — Emergency Department (HOSPITAL_COMMUNITY)
Admission: EM | Admit: 2011-01-30 | Discharge: 2011-01-30 | Disposition: A | Discharge status: Home / Self Care | Payer: Medicare Other | Attending: Emergency Medicine | Admitting: Emergency Medicine

## 2011-01-30 DIAGNOSIS — N186 End stage renal disease: Secondary | ICD-10-CM | POA: Insufficient documentation

## 2011-01-30 DIAGNOSIS — Z79899 Other long term (current) drug therapy: Secondary | ICD-10-CM | POA: Insufficient documentation

## 2011-01-30 DIAGNOSIS — IMO0002 Reserved for concepts with insufficient information to code with codable children: Secondary | ICD-10-CM | POA: Insufficient documentation

## 2011-01-30 DIAGNOSIS — R5381 Other malaise: Secondary | ICD-10-CM | POA: Insufficient documentation

## 2011-01-30 DIAGNOSIS — N390 Urinary tract infection, site not specified: Secondary | ICD-10-CM | POA: Insufficient documentation

## 2011-01-30 DIAGNOSIS — J3489 Other specified disorders of nose and nasal sinuses: Secondary | ICD-10-CM | POA: Insufficient documentation

## 2011-01-30 DIAGNOSIS — R630 Anorexia: Secondary | ICD-10-CM | POA: Insufficient documentation

## 2011-01-30 DIAGNOSIS — I120 Hypertensive chronic kidney disease with stage 5 chronic kidney disease or end stage renal disease: Secondary | ICD-10-CM | POA: Insufficient documentation

## 2011-01-30 DIAGNOSIS — R6883 Chills (without fever): Secondary | ICD-10-CM | POA: Insufficient documentation

## 2011-01-30 DIAGNOSIS — D649 Anemia, unspecified: Secondary | ICD-10-CM | POA: Insufficient documentation

## 2011-01-30 LAB — URINALYSIS, ROUTINE W REFLEX MICROSCOPIC
Bilirubin Urine: NEGATIVE
Nitrite: NEGATIVE
Protein, ur: 100 mg/dL — AB
Specific Gravity, Urine: 1.017 (ref 1.005–1.030)
Specific Gravity, Urine: 1.016 (ref 1.005–1.030)
Urine Glucose, Fasting: NEGATIVE mg/dL
Urobilinogen, UA: 0.2 mg/dL (ref 0.0–1.0)
Urobilinogen, UA: 0.2 mg/dL (ref 0.0–1.0)

## 2011-01-30 LAB — URINE MICROSCOPIC-ADD ON

## 2011-01-30 LAB — DIFFERENTIAL
Basophils Absolute: 0 10*3/uL (ref 0.0–0.1)
Basophils Relative: 0 % (ref 0–1)
Eosinophils Absolute: 0.3 10*3/uL (ref 0.0–0.7)
Eosinophils Relative: 5 % (ref 0–5)
Lymphs Abs: 1 10*3/uL (ref 0.7–4.0)
Monocytes Absolute: 0.6 10*3/uL (ref 0.1–1.0)
Monocytes Relative: 9 % (ref 3–12)
Neutrophils Relative %: 70 % (ref 43–77)

## 2011-01-30 LAB — CBC
HCT: 26 % — ABNORMAL LOW (ref 36.0–46.0)
Hemoglobin: 8.1 g/dL — ABNORMAL LOW (ref 12.0–15.0)
MCV: 92.5 fL (ref 78.0–100.0)
Platelets: 203 10*3/uL (ref 150–400)
RBC: 2.81 MIL/uL — ABNORMAL LOW (ref 3.87–5.11)

## 2011-01-30 LAB — BASIC METABOLIC PANEL
Creatinine, Ser: 5.1 mg/dL — ABNORMAL HIGH (ref 0.4–1.2)
GFR calc Af Amer: 10 mL/min — ABNORMAL LOW (ref >60)
GFR calc non Af Amer: 9 mL/min — ABNORMAL LOW (ref >60)

## 2011-02-01 LAB — URINE CULTURE
Culture  Setup Time: 201202171820
Culture: NO GROWTH

## 2011-02-02 ENCOUNTER — Encounter (HOSPITAL_COMMUNITY): Payer: Medicare Other

## 2011-02-04 ENCOUNTER — Encounter (HOSPITAL_COMMUNITY): Payer: Medicare Other

## 2011-02-09 ENCOUNTER — Encounter (HOSPITAL_COMMUNITY): Payer: Medicare Other

## 2011-02-10 ENCOUNTER — Other Ambulatory Visit: Payer: Self-pay | Admitting: Nephrology

## 2011-02-10 ENCOUNTER — Other Ambulatory Visit: Payer: Self-pay

## 2011-02-10 ENCOUNTER — Encounter (HOSPITAL_COMMUNITY): Payer: Medicare Other

## 2011-02-11 LAB — RENAL FUNCTION PANEL
Albumin: 3.5 g/dL (ref 3.5–5.2)
BUN: 60 mg/dL — ABNORMAL HIGH (ref 6–23)
Calcium: 9.2 mg/dL (ref 8.4–10.5)
Chloride: 113 mEq/L — ABNORMAL HIGH (ref 96–112)
Creatinine, Ser: 3.66 mg/dL — ABNORMAL HIGH (ref 0.4–1.2)
GFR calc Af Amer: 15 mL/min — ABNORMAL LOW (ref >60)
GFR calc non Af Amer: 13 mL/min — ABNORMAL LOW (ref >60)
Phosphorus: 4 mg/dL (ref 2.3–4.6)

## 2011-02-11 LAB — PTH, INTACT AND CALCIUM: Calcium, Total (PTH): 8.9 mg/dL (ref 8.4–10.5)

## 2011-02-16 ENCOUNTER — Encounter (HOSPITAL_COMMUNITY): Payer: Medicare Other | Attending: Nephrology

## 2011-02-16 ENCOUNTER — Other Ambulatory Visit: Payer: Self-pay

## 2011-02-16 DIAGNOSIS — D638 Anemia in other chronic diseases classified elsewhere: Secondary | ICD-10-CM | POA: Insufficient documentation

## 2011-02-16 DIAGNOSIS — N184 Chronic kidney disease, stage 4 (severe): Secondary | ICD-10-CM | POA: Insufficient documentation

## 2011-02-16 LAB — POCT HEMOGLOBIN-HEMACUE: Hemoglobin: 7.9 g/dL — ABNORMAL LOW (ref 12.0–15.0)

## 2011-02-23 ENCOUNTER — Other Ambulatory Visit: Payer: Self-pay

## 2011-02-23 ENCOUNTER — Encounter (HOSPITAL_COMMUNITY): Payer: Medicare Other

## 2011-02-23 LAB — FERRITIN: Ferritin: 630 ng/mL — ABNORMAL HIGH (ref 10–291)

## 2011-02-23 LAB — POCT HEMOGLOBIN-HEMACUE: Hemoglobin: 9.4 g/dL — ABNORMAL LOW (ref 12.0–15.0)

## 2011-02-23 LAB — IRON AND TIBC
Iron: 56 ug/dL (ref 42–135)
Saturation Ratios: 26 % (ref 20–55)
TIBC: 217 ug/dL — ABNORMAL LOW (ref 250–470)
UIBC: 161 ug/dL

## 2011-02-25 ENCOUNTER — Inpatient Hospital Stay (INDEPENDENT_AMBULATORY_CARE_PROVIDER_SITE_OTHER)
Admission: RE | Admit: 2011-02-25 | Discharge: 2011-02-25 | Disposition: A | Discharge status: Home / Self Care | Payer: Medicare Other | Source: Ambulatory Visit | Attending: Family Medicine | Admitting: Family Medicine

## 2011-02-25 DIAGNOSIS — J020 Streptococcal pharyngitis: Secondary | ICD-10-CM

## 2011-02-25 LAB — CBC
HCT: 26.5 % — ABNORMAL LOW (ref 36.0–46.0)
HCT: 27.6 % — ABNORMAL LOW (ref 36.0–46.0)
HCT: 31.4 % — ABNORMAL LOW (ref 36.0–46.0)
HCT: 31.8 % — ABNORMAL LOW (ref 36.0–46.0)
HCT: 34.3 % — ABNORMAL LOW (ref 36.0–46.0)
Hemoglobin: 10 g/dL — ABNORMAL LOW (ref 12.0–15.0)
Hemoglobin: 8.3 g/dL — ABNORMAL LOW (ref 12.0–15.0)
Hemoglobin: 9.4 g/dL — ABNORMAL LOW (ref 12.0–15.0)
MCH: 28.7 pg (ref 26.0–34.0)
MCH: 28.8 pg (ref 26.0–34.0)
MCH: 28.9 pg (ref 26.0–34.0)
MCH: 29.2 pg (ref 26.0–34.0)
MCHC: 29.9 g/dL — ABNORMAL LOW (ref 30.0–36.0)
MCHC: 30.1 g/dL (ref 30.0–36.0)
MCHC: 30.6 g/dL (ref 30.0–36.0)
MCHC: 31.1 g/dL (ref 30.0–36.0)
MCHC: 31.4 g/dL (ref 30.0–36.0)
MCV: 92.6 fL (ref 78.0–100.0)
MCV: 93.8 fL (ref 78.0–100.0)
MCV: 94.6 fL (ref 78.0–100.0)
MCV: 95.5 fL (ref 78.0–100.0)
MCV: 96.1 fL (ref 78.0–100.0)
MCV: 96.3 fL (ref 78.0–100.0)
Platelets: 170 K/uL (ref 150–400)
Platelets: 176 10*3/uL (ref 150–400)
Platelets: 183 10*3/uL (ref 150–400)
Platelets: 201 10*3/uL (ref 150–400)
RBC: 3.26 MIL/uL — ABNORMAL LOW (ref 3.87–5.11)
RBC: 3.57 MIL/uL — ABNORMAL LOW (ref 3.87–5.11)
RDW: 14.8 % (ref 11.5–15.5)
RDW: 14.8 % (ref 11.5–15.5)
RDW: 14.8 % (ref 11.5–15.5)
RDW: 16 % — ABNORMAL HIGH (ref 11.5–15.5)
RDW: 16.1 % — ABNORMAL HIGH (ref 11.5–15.5)
WBC: 5.6 10*3/uL (ref 4.0–10.5)
WBC: 6.6 K/uL (ref 4.0–10.5)
WBC: 6.8 10*3/uL (ref 4.0–10.5)
WBC: 9.2 10*3/uL (ref 4.0–10.5)

## 2011-02-25 LAB — COMPREHENSIVE METABOLIC PANEL
ALT: <8 U/L (ref 0–35)
Albumin: 3.6 g/dL (ref 3.5–5.2)
Alkaline Phosphatase: 85 U/L (ref 39–117)
Chloride: 105 mEq/L (ref 96–112)
Glucose, Bld: 89 mg/dL (ref 70–99)
Potassium: 3.9 mEq/L (ref 3.5–5.1)
Sodium: 136 mEq/L (ref 135–145)
Total Bilirubin: 0.5 mg/dL (ref 0.3–1.2)
Total Protein: 7.4 g/dL (ref 6.0–8.3)

## 2011-02-25 LAB — PROTIME-INR
INR: 1.02 (ref 0.00–1.49)
Prothrombin Time: 13.6 s (ref 11.6–15.2)

## 2011-02-25 LAB — CROSSMATCH

## 2011-02-25 LAB — RENAL FUNCTION PANEL
BUN: 65 mg/dL — ABNORMAL HIGH (ref 6–23)
CO2: 20 mEq/L (ref 19–32)
Chloride: 102 mEq/L (ref 96–112)
Glucose, Bld: 111 mg/dL — ABNORMAL HIGH (ref 70–99)
Phosphorus: 3.8 mg/dL (ref 2.3–4.6)
Potassium: 3.4 mEq/L — ABNORMAL LOW (ref 3.5–5.1)
Sodium: 137 mEq/L (ref 135–145)

## 2011-02-25 LAB — PTH, INTACT AND CALCIUM: PTH: 810.5 pg/mL — ABNORMAL HIGH (ref 14.0–72.0)

## 2011-02-25 LAB — POCT HEMOGLOBIN-HEMACUE
Hemoglobin: 11.1 g/dL — ABNORMAL LOW (ref 12.0–15.0)
Hemoglobin: 12 g/dL (ref 12.0–15.0)

## 2011-02-25 LAB — DIFFERENTIAL
Basophils Absolute: 0 10*3/uL (ref 0.0–0.1)
Basophils Relative: 1 % (ref 0–1)
Eosinophils Absolute: 0.2 10*3/uL (ref 0.0–0.7)
Monocytes Absolute: 0.4 10*3/uL (ref 0.1–1.0)
Monocytes Relative: 7 % (ref 3–12)

## 2011-02-25 LAB — BASIC METABOLIC PANEL
BUN: 56 mg/dL — ABNORMAL HIGH (ref 6–23)
CO2: 19 mEq/L (ref 19–32)
Creatinine, Ser: 3.4 mg/dL — ABNORMAL HIGH (ref 0.4–1.2)
GFR calc non Af Amer: 14 mL/min — ABNORMAL LOW (ref >60)
Glucose, Bld: 97 mg/dL (ref 70–99)
Potassium: 3.6 mEq/L (ref 3.5–5.1)
Sodium: 137 mEq/L (ref 135–145)

## 2011-02-25 LAB — IRON AND TIBC: Saturation Ratios: 28 % (ref 20–55)

## 2011-02-25 LAB — APTT: aPTT: 29 s (ref 24–37)

## 2011-02-26 LAB — POCT HEMOGLOBIN-HEMACUE
Hemoglobin: 11.8 g/dL — ABNORMAL LOW (ref 12.0–15.0)
Hemoglobin: 10.8 g/dL — ABNORMAL LOW (ref 12.0–15.0)
Hemoglobin: 11.2 g/dL — ABNORMAL LOW (ref 12.0–15.0)
Hemoglobin: 9.8 g/dL — ABNORMAL LOW (ref 12.0–15.0)

## 2011-02-26 LAB — IRON AND TIBC
Iron: 38 ug/dL — ABNORMAL LOW (ref 42–135)
Saturation Ratios: 19 % — ABNORMAL LOW (ref 20–55)
TIBC: 198 ug/dL — ABNORMAL LOW (ref 250–470)
UIBC: 160 ug/dL

## 2011-02-26 LAB — FERRITIN: Ferritin: 399 ng/mL — ABNORMAL HIGH (ref 10–291)

## 2011-02-27 LAB — RENAL FUNCTION PANEL
Albumin: 3.8 g/dL (ref 3.5–5.2)
BUN: 84 mg/dL — ABNORMAL HIGH (ref 6–23)
Calcium: 10 mg/dL (ref 8.4–10.5)
Creatinine, Ser: 4.47 mg/dL — ABNORMAL HIGH (ref 0.4–1.2)
Phosphorus: 4.7 mg/dL — ABNORMAL HIGH (ref 2.3–4.6)
Potassium: 3.9 mEq/L (ref 3.5–5.1)

## 2011-02-27 LAB — PTH, INTACT AND CALCIUM
Calcium, Total (PTH): 9.7 mg/dL (ref 8.4–10.5)
PTH: 672.4 pg/mL — ABNORMAL HIGH (ref 14.0–72.0)

## 2011-02-27 LAB — POCT HEMOGLOBIN-HEMACUE
Hemoglobin: 8.8 g/dL — ABNORMAL LOW (ref 12.0–15.0)
Hemoglobin: 9.5 g/dL — ABNORMAL LOW (ref 12.0–15.0)
Hemoglobin: 9.1 g/dL — ABNORMAL LOW (ref 12.0–15.0)

## 2011-02-28 LAB — IRON AND TIBC
Saturation Ratios: 26 % (ref 20–55)
TIBC: 213 ug/dL — ABNORMAL LOW (ref 250–470)

## 2011-02-28 LAB — POCT HEMOGLOBIN-HEMACUE: Hemoglobin: 8.2 g/dL — ABNORMAL LOW (ref 12.0–15.0)

## 2011-03-02 ENCOUNTER — Other Ambulatory Visit: Payer: Self-pay | Admitting: Nephrology

## 2011-03-02 ENCOUNTER — Encounter (HOSPITAL_COMMUNITY): Payer: Medicare Other

## 2011-03-02 ENCOUNTER — Other Ambulatory Visit: Payer: Self-pay

## 2011-03-02 LAB — IRON AND TIBC
Iron: 42 ug/dL (ref 42–135)
Saturation Ratios: 28 % (ref 20–55)
Saturation Ratios: 37 % (ref 20–55)
TIBC: 152 ug/dL — ABNORMAL LOW (ref 250–470)
TIBC: 216 ug/dL — ABNORMAL LOW (ref 250–470)
UIBC: 110 ug/dL
UIBC: 136 ug/dL
UIBC: 174 ug/dL

## 2011-03-02 LAB — RENAL FUNCTION PANEL
CO2: 21 mEq/L (ref 19–32)
Chloride: 108 mEq/L (ref 96–112)
Creatinine, Ser: 3.8 mg/dL — ABNORMAL HIGH (ref 0.4–1.2)
GFR calc Af Amer: 15 mL/min — ABNORMAL LOW (ref >60)
GFR calc non Af Amer: 12 mL/min — ABNORMAL LOW (ref >60)

## 2011-03-02 LAB — FERRITIN: Ferritin: 829 ng/mL — ABNORMAL HIGH (ref 10–291)

## 2011-03-02 LAB — POCT HEMOGLOBIN-HEMACUE
Hemoglobin: 8.6 g/dL — ABNORMAL LOW (ref 12.0–15.0)
Hemoglobin: 8.6 g/dL — ABNORMAL LOW (ref 12.0–15.0)

## 2011-03-03 LAB — POCT HEMOGLOBIN-HEMACUE: Hemoglobin: 11.2 g/dL — ABNORMAL LOW (ref 12.0–15.0)

## 2011-03-04 LAB — POCT HEMOGLOBIN-HEMACUE: Hemoglobin: 12.3 g/dL (ref 12.0–15.0)

## 2011-03-06 LAB — POCT HEMOGLOBIN-HEMACUE
Hemoglobin: 10.4 g/dL — ABNORMAL LOW (ref 12.0–15.0)
Hemoglobin: 9.7 g/dL — ABNORMAL LOW (ref 12.0–15.0)

## 2011-03-06 LAB — IRON AND TIBC: UIBC: 172 ug/dL

## 2011-03-06 LAB — FERRITIN: Ferritin: 391 ng/mL — ABNORMAL HIGH (ref 10–291)

## 2011-03-09 ENCOUNTER — Encounter (HOSPITAL_COMMUNITY): Payer: Medicare Other

## 2011-03-09 ENCOUNTER — Other Ambulatory Visit: Payer: Self-pay | Admitting: Nephrology

## 2011-03-09 LAB — POCT HEMOGLOBIN-HEMACUE
Hemoglobin: 11.2 g/dL — ABNORMAL LOW (ref 12.0–15.0)
Hemoglobin: 11.6 g/dL — ABNORMAL LOW (ref 12.0–15.0)
Hemoglobin: 11.8 g/dL — ABNORMAL LOW (ref 12.0–15.0)

## 2011-03-16 ENCOUNTER — Other Ambulatory Visit: Payer: Self-pay | Admitting: Nephrology

## 2011-03-16 ENCOUNTER — Encounter (HOSPITAL_COMMUNITY): Payer: Medicare Other | Attending: Nephrology

## 2011-03-16 DIAGNOSIS — N184 Chronic kidney disease, stage 4 (severe): Secondary | ICD-10-CM | POA: Insufficient documentation

## 2011-03-16 DIAGNOSIS — D638 Anemia in other chronic diseases classified elsewhere: Secondary | ICD-10-CM | POA: Insufficient documentation

## 2011-03-16 LAB — POCT HEMOGLOBIN-HEMACUE
Hemoglobin: 9.2 g/dL — ABNORMAL LOW (ref 12.0–15.0)
Hemoglobin: 10.1 g/dL — ABNORMAL LOW (ref 12.0–15.0)

## 2011-03-17 LAB — POCT HEMOGLOBIN-HEMACUE: Hemoglobin: 9 g/dL — ABNORMAL LOW (ref 12.0–15.0)

## 2011-03-18 LAB — URINALYSIS, ROUTINE W REFLEX MICROSCOPIC
Bilirubin Urine: NEGATIVE
Glucose, UA: NEGATIVE mg/dL
Ketones, ur: NEGATIVE mg/dL
Leukocytes, UA: NEGATIVE
Protein, ur: 30 mg/dL — AB
pH: 5 (ref 5.0–8.0)

## 2011-03-18 LAB — RENAL FUNCTION PANEL
Calcium: 9.1 mg/dL (ref 8.4–10.5)
Creatinine, Ser: 3.3 mg/dL — ABNORMAL HIGH (ref 0.4–1.2)
GFR calc Af Amer: 17 mL/min — ABNORMAL LOW (ref >60)
Glucose, Bld: 111 mg/dL — ABNORMAL HIGH (ref 70–99)
Phosphorus: 4 mg/dL (ref 2.3–4.6)
Sodium: 139 mEq/L (ref 135–145)

## 2011-03-18 LAB — IRON AND TIBC
Iron: 77 ug/dL (ref 42–135)
TIBC: 223 ug/dL — ABNORMAL LOW (ref 250–470)
UIBC: 146 ug/dL

## 2011-03-18 LAB — URINE MICROSCOPIC-ADD ON

## 2011-03-18 LAB — STOOL CULTURE

## 2011-03-18 LAB — CLOSTRIDIUM DIFFICILE EIA: C difficile Toxins A+B, EIA: NEGATIVE

## 2011-03-18 LAB — CBC
Hemoglobin: 9.2 g/dL — ABNORMAL LOW (ref 12.0–15.0)
MCHC: 32.8 g/dL (ref 30.0–36.0)
MCV: 94.7 fL (ref 78.0–100.0)
RDW: 16 % — ABNORMAL HIGH (ref 11.5–15.5)

## 2011-03-18 LAB — FERRITIN: Ferritin: 642 ng/mL — ABNORMAL HIGH (ref 10–291)

## 2011-03-19 LAB — CBC
HCT: 29 % — ABNORMAL LOW (ref 36.0–46.0)
HCT: 30 % — ABNORMAL LOW (ref 36.0–46.0)
MCHC: 33.5 g/dL (ref 30.0–36.0)
MCV: 93.9 fL (ref 78.0–100.0)
MCV: 94.7 fL (ref 78.0–100.0)
Platelets: 231 10*3/uL (ref 150–400)
Platelets: 232 10*3/uL (ref 150–400)
RDW: 16.1 % — ABNORMAL HIGH (ref 11.5–15.5)
WBC: 12 10*3/uL — ABNORMAL HIGH (ref 4.0–10.5)
WBC: 9 10*3/uL (ref 4.0–10.5)

## 2011-03-19 LAB — COMPREHENSIVE METABOLIC PANEL
AST: 11 U/L (ref 0–37)
Albumin: 2.9 g/dL — ABNORMAL LOW (ref 3.5–5.2)
Albumin: 2.9 g/dL — ABNORMAL LOW (ref 3.5–5.2)
BUN: 67 mg/dL — ABNORMAL HIGH (ref 6–23)
BUN: 68 mg/dL — ABNORMAL HIGH (ref 6–23)
CO2: 19 mEq/L (ref 19–32)
Calcium: 9.5 mg/dL (ref 8.4–10.5)
Chloride: 104 mEq/L (ref 96–112)
Chloride: 107 mEq/L (ref 96–112)
Creatinine, Ser: 3.73 mg/dL — ABNORMAL HIGH (ref 0.4–1.2)
Creatinine, Ser: 3.9 mg/dL — ABNORMAL HIGH (ref 0.4–1.2)
GFR calc Af Amer: 15 mL/min — ABNORMAL LOW (ref >60)
GFR calc non Af Amer: 12 mL/min — ABNORMAL LOW (ref >60)
Total Bilirubin: 0.8 mg/dL (ref 0.3–1.2)
Total Protein: 7.5 g/dL (ref 6.0–8.3)

## 2011-03-19 LAB — CULTURE, BLOOD (ROUTINE X 2)

## 2011-03-19 LAB — URINE CULTURE: Colony Count: 8000

## 2011-03-19 LAB — C-REACTIVE PROTEIN: CRP: 19.4 mg/dL — ABNORMAL HIGH (ref <0.6)

## 2011-03-19 LAB — FECAL LACTOFERRIN, QUANT

## 2011-03-19 LAB — DIFFERENTIAL
Basophils Absolute: 0 10*3/uL (ref 0.0–0.1)
Eosinophils Relative: 0 % (ref 0–5)
Lymphocytes Relative: 4 % — ABNORMAL LOW (ref 12–46)
Lymphocytes Relative: 7 % — ABNORMAL LOW (ref 12–46)
Lymphs Abs: 0.6 10*3/uL — ABNORMAL LOW (ref 0.7–4.0)
Monocytes Absolute: 0.6 10*3/uL (ref 0.1–1.0)
Neutro Abs: 10.6 10*3/uL — ABNORMAL HIGH (ref 1.7–7.7)
Neutro Abs: 7.7 10*3/uL (ref 1.7–7.7)
Neutrophils Relative %: 89 % — ABNORMAL HIGH (ref 43–77)

## 2011-03-19 LAB — URINE MICROSCOPIC-ADD ON

## 2011-03-19 LAB — IRON AND TIBC: TIBC: 169 ug/dL — ABNORMAL LOW (ref 250–470)

## 2011-03-19 LAB — URINALYSIS, ROUTINE W REFLEX MICROSCOPIC
Bilirubin Urine: NEGATIVE
Protein, ur: 30 mg/dL — AB
Urobilinogen, UA: 0.2 mg/dL (ref 0.0–1.0)

## 2011-03-19 LAB — STOOL CULTURE

## 2011-03-19 LAB — CLOSTRIDIUM DIFFICILE EIA

## 2011-03-19 LAB — SEDIMENTATION RATE: Sed Rate: 98 mm/hr — ABNORMAL HIGH (ref 0–22)

## 2011-03-19 LAB — CREATININE, URINE, RANDOM: Creatinine, Urine: 93.2 mg/dL

## 2011-03-20 LAB — POCT HEMOGLOBIN-HEMACUE
Hemoglobin: 10.2 g/dL — ABNORMAL LOW (ref 12.0–15.0)
Hemoglobin: 10.5 g/dL — ABNORMAL LOW (ref 12.0–15.0)
Hemoglobin: 9.2 g/dL — ABNORMAL LOW (ref 12.0–15.0)

## 2011-03-20 LAB — IRON AND TIBC
Saturation Ratios: 27 % (ref 20–55)
TIBC: 216 ug/dL — ABNORMAL LOW (ref 250–470)

## 2011-03-21 LAB — IRON AND TIBC
Saturation Ratios: 23 % (ref 20–55)
UIBC: 159 ug/dL

## 2011-03-21 LAB — FERRITIN: Ferritin: 432 ng/mL — ABNORMAL HIGH (ref 10–291)

## 2011-03-21 LAB — POCT HEMOGLOBIN-HEMACUE: Hemoglobin: 9.3 g/dL — ABNORMAL LOW (ref 12.0–15.0)

## 2011-03-22 LAB — IRON AND TIBC
Iron: 49 ug/dL (ref 42–135)
Saturation Ratios: 25 % (ref 20–55)
TIBC: 198 ug/dL — ABNORMAL LOW (ref 250–470)
UIBC: 149 ug/dL

## 2011-03-22 LAB — POCT HEMOGLOBIN-HEMACUE
Hemoglobin: 9.2 g/dL — ABNORMAL LOW (ref 12.0–15.0)
Hemoglobin: 10.1 g/dL — ABNORMAL LOW (ref 12.0–15.0)

## 2011-03-22 LAB — FERRITIN: Ferritin: 532 ng/mL — ABNORMAL HIGH (ref 10–291)

## 2011-03-23 ENCOUNTER — Other Ambulatory Visit: Payer: Self-pay | Admitting: Nephrology

## 2011-03-23 ENCOUNTER — Encounter (HOSPITAL_COMMUNITY): Payer: Medicare Other

## 2011-03-23 LAB — IRON AND TIBC
Iron: 76 ug/dL (ref 42–135)
Saturation Ratios: 33 % (ref 20–55)
TIBC: 232 ug/dL — ABNORMAL LOW (ref 250–470)

## 2011-03-23 LAB — POCT HEMOGLOBIN-HEMACUE
Hemoglobin: 10.9 g/dL — ABNORMAL LOW (ref 12.0–15.0)
Hemoglobin: 10.8 g/dL — ABNORMAL LOW (ref 12.0–15.0)
Hemoglobin: 11.1 g/dL — ABNORMAL LOW (ref 12.0–15.0)

## 2011-03-23 LAB — FERRITIN: Ferritin: 380 ng/mL — ABNORMAL HIGH (ref 10–291)

## 2011-03-24 LAB — IRON AND TIBC
Iron: 61 ug/dL (ref 42–135)
Saturation Ratios: 29 % (ref 20–55)
TIBC: 213 ug/dL — ABNORMAL LOW (ref 250–470)

## 2011-03-24 LAB — FERRITIN: Ferritin: 305 ng/mL — ABNORMAL HIGH (ref 10–291)

## 2011-03-26 LAB — POCT HEMOGLOBIN-HEMACUE: Hemoglobin: 10.8 g/dL — ABNORMAL LOW (ref 12.0–15.0)

## 2011-03-26 LAB — IRON AND TIBC
Iron: 36 ug/dL — ABNORMAL LOW (ref 42–135)
Saturation Ratios: 16 % — ABNORMAL LOW (ref 20–55)
TIBC: 220 ug/dL — ABNORMAL LOW (ref 250–470)
UIBC: 184 ug/dL

## 2011-03-30 ENCOUNTER — Other Ambulatory Visit: Payer: Self-pay | Admitting: Nephrology

## 2011-03-30 ENCOUNTER — Encounter (HOSPITAL_COMMUNITY): Payer: Medicare Other

## 2011-03-30 LAB — IRON AND TIBC
Saturation Ratios: 26 % (ref 20–55)
TIBC: 214 ug/dL — ABNORMAL LOW (ref 250–470)
UIBC: 158 ug/dL

## 2011-03-30 LAB — POCT HEMOGLOBIN-HEMACUE: Hemoglobin: 9.3 g/dL — ABNORMAL LOW (ref 12.0–15.0)

## 2011-03-30 LAB — FERRITIN: Ferritin: 405 ng/mL — ABNORMAL HIGH (ref 10–291)

## 2011-03-31 LAB — FERRITIN: Ferritin: 300 ng/mL — ABNORMAL HIGH (ref 10–291)

## 2011-03-31 LAB — POCT HEMOGLOBIN-HEMACUE
Hemoglobin: 10.8 g/dL — ABNORMAL LOW (ref 12.0–15.0)
Hemoglobin: 9.5 g/dL — ABNORMAL LOW (ref 12.0–15.0)

## 2011-03-31 LAB — IRON AND TIBC
Saturation Ratios: 37 % (ref 20–55)
UIBC: 131 ug/dL

## 2011-04-06 ENCOUNTER — Other Ambulatory Visit: Payer: Self-pay | Admitting: Nephrology

## 2011-04-06 ENCOUNTER — Encounter (HOSPITAL_COMMUNITY): Payer: Medicare Other

## 2011-04-07 LAB — POCT HEMOGLOBIN-HEMACUE: Hemoglobin: 9.5 g/dL — ABNORMAL LOW (ref 12.0–15.0)

## 2011-04-13 ENCOUNTER — Encounter (HOSPITAL_COMMUNITY): Payer: Medicare Other

## 2011-04-13 ENCOUNTER — Other Ambulatory Visit: Payer: Self-pay | Admitting: Nephrology

## 2011-04-20 ENCOUNTER — Other Ambulatory Visit: Payer: Self-pay | Admitting: Nephrology

## 2011-04-20 ENCOUNTER — Encounter (HOSPITAL_COMMUNITY): Payer: Medicare Other | Attending: Nephrology

## 2011-04-20 DIAGNOSIS — D638 Anemia in other chronic diseases classified elsewhere: Secondary | ICD-10-CM | POA: Insufficient documentation

## 2011-04-20 DIAGNOSIS — N184 Chronic kidney disease, stage 4 (severe): Secondary | ICD-10-CM | POA: Insufficient documentation

## 2011-04-27 ENCOUNTER — Encounter (HOSPITAL_COMMUNITY): Payer: Medicare Other

## 2011-04-27 ENCOUNTER — Other Ambulatory Visit: Payer: Self-pay | Admitting: Nephrology

## 2011-04-28 NOTE — Discharge Summary (Signed)
[name], [name]             ACCOUNT NO.:  [id number]   MEDICAL RECORD NO.:  [id number]          PATIENT TYPE:  INP   LOCATION:  5531                         FACILITY:  [facility]   PHYSICIAN:  Louis Meckel, M.D.DATE OF BIRTH:  [birth year]   DATE OF ADMISSION:  11/22/2007  DATE OF DISCHARGE:  11/30/2007                               DISCHARGE SUMMARY   DISCHARGE DIAGNOSES:  1. Persistent left lingular pneumonia.  2. History of renal transplant with acute-on-chronic renal failure.  3. Iron deficiency anemia.  4. Hypertension.  5. Diarrhea.  6. Gout.  7. Peripheral neuropathy.  8. Hyperlipidemia.  9. Anxiety, depression.   CONSULT:  Infectious disease.   PROCEDURES:  1. A CAT scan of the chest without contrast November 23, 2007:  1)      reticular nodular infiltrative changes within the lingula and the      left lower lobe most consistent with an atypical infection, 2) a 5      x 11 mm noncalcified nodular density within the right upper lobe.      Recommend followup CAT scan in 3 months with attention to this      nodule.  There are also areas of nodular pleural thickening noted,      3) no evidence for cavitary lesion.   HISTORY OF PRESENT ILLNESS:  [name] is a 60 year old African-  American female with an extensive past medical history that includes  chronic kidney disease stage IV, status post renal transplant July 29, 1995.  The patient originally had some coughing and shortness of breath.  On November 19, a chest x-ray at that time revealed a left upper lobe  lingular pneumonia.  The patient was prescribed a 10-day course of  Augmentin, which provided some temporary improvement; however, the  patient now presents to [place] office complaining of  persistent worsening shortness of breath on exertion, productive cough  with yellow-green phlegm, chills, decreased appetite, and increased  fatigue for the last 2 weeks.  She is also  complaining of some left-  sided chest soreness and diarrhea.  She was admitted for further  evaluation.   ADMISSION LABORATORIES:  Hemoglobin 8.3, hematocrit 25.1, white count  7.9, platelets 298,000.  Sodium 135, potassium 3.6, chloride 102, CO2  22, BUN 61, creatinine 3.4, glucose 90, calcium 9.1, albumin 3.5, total  protein 6.7,  total bilirubin 0.7.  Liver function tests within normal  limits.  Recent serum creatinines have been as follows:  December 5th  3.7, December 1st 3.3, August 14th 2.17 and April 2.5.   HOSPITAL COURSE:  1. Persistent left lingular pneumonia.  The patient was admitted to a      private room on the medical renal unit.  She was given gentle      rehydration and IV Avelox.  Infectious disease was consulted given      the persistence of her pneumonia and transplant status.  She also      had cardiac enzymes due to her chest discomfort, but these were      negative x 2.  Dr. Michel Bickers evaluated  the patient.  He felt      that her pneumonia was probably community-acquired due to      pneumococcus and that opportunistic pathogens were unlikely.  He      agreed with using Avelox pending diagnostic tests.  Blood cultures      drawn on admission showed no growth.  Urine culture was negative.      Urine was also negative for Legionella as was sputum.  Respiratory      culture showed normal flora.  She was treated throughout her      hospitalization with Avelox, which was eventually transitioned to      oral.  She received breathing treatments and then changed to      albuterol.  Mucinex, Tussionex were also used for symptomatic      relief.  She gradually improved throughout her stay and increased      in mobilization.  She was eventually able to walk without      supplemental oxygen.  Infectious disease recommended a least      another week of Avelox and perhaps 2 if her productive cough      lingered.  She was able to be discharged home on December 17 and       was to follow up with Dr. Florene Glen early in January.  2. History of renal transplant with acute-on-chronic renal failure      stage IV.  As previously mentioned, the patient received gentle      rehydration.  Her intake initially was very poor.  Creatinine was      3.7 on admission and dropped down as low as 1.9.  Her IV fluids      were discontinued and creatinine was back to 2.4 at the time of      discharge, which is around baseline for her in her usual stage IV      range.  Potassium throughout her hospitalization was within normal      limits.  Her Lasix was held during her hospitalization but will be      resumed at the specified dose at discharge due to the expectation      that she will eat a somewhat different diet than the hospital diet      after discharge.  The patient's transplant medicines were      unchanged.  3. Iron deficiency anemia.  Hemoglobin on admission was 8.6.  It      dropped down as low as 7.8.  Iron studies were done and found to be      low.  The patient received nearly a full course of iron while in      the hospital.  She was also given Aranesp 200 mcg, and she will be      started on this via a short stay December 26.  Hemoglobin at      discharge was 7.9.  4. Hypertension.  The patient's blood pressure medicines were      initially held.  They were eventually restarted and later Lasix was      started prior to discharge.  Blood pressure on the day of discharge      ran between [id number] to [id number] systolic over 80.  5. Diarrhea.  The patient's diarrhea most likely was related to the      antibiotics she was taking prior to admission.  She did have      multiple studies done in the hospital, including stool for  C. diff.      Various urine cultures and screens for Giardia and Cryptosporidia      all were negative.  Diarrhea resolved before discharge.  6. Gout.  The patient continues on current medications.  7. Peripheral neuropathy.  The patient is controlled with  low-dose      Elavil at bedtime.  8. Hyperlipidemia.  The patient continued on Zocor throughout her      hospitalization.  9. Anxiety and depression.  The patient has not required any      medications at this time although Elavil for neuropathy may be      providing some benefit.   CONDITION ON DISCHARGE:  Improved.   DISPOSITION:  The patient is discharged to home.   DISCHARGE MEDICATIONS:  1. Avelox 400 mg once a day x7 days with 1 refill.  2. CellCept 250 mg 3 tablets twice a day.  3. Neoral 150 mg twice a day.  4. Prednisone 5 mg daily.  5. Calcitriol 0.25 mcg daily.  6. Labetalol 200 mg 2 tablets 2 times a day.  7. Cardura 8 mg twice a day.  8. Allopurinol 150 mg daily.  9. Mucinex 600 mg b.i.d. p.r.n.  10.Zocor 20 mg daily.  11.Pepcid 20 mg daily.  12.Elavil 25 mg at bedtime.  13.Norvasc 2.5 mg at bedtime.  14.Folic acid 1 daily.  15.Albuterol 2 puffs 4 times a day p.r.n.  16.Lasix 80 mg daily.  17.Tussionex 1 teaspoon every 6 hours p.r.n.  18.Ocean spray as needed p.r.n.   DISCHARGE DIET:  Low-sodium.   ACTIVITY:  As tolerated.   FOLLOWUP APPOINTMENTS:  Short stay December 26 and Dr. Florene Glen January 7.   Forty-five minutes were spent completing the patient's discharge  information and dictating discharge summary.      Alric Seton, P.A.    ______________________________  Louis Meckel, M.D.    MB/MEDQ  D:  12/30/2007  T:  12/30/2007  Job:  [id number]   cc:   Darrold Span. Florene Glen, M.D.

## 2011-04-28 NOTE — H&P (Signed)
NAMEIFE, CRAPSER             ACCOUNT NO.:  [id number]   MEDICAL RECORD NO.:  [id number]          PATIENT TYPE:  INP   LOCATION:  [place]                         FACILITY:  [facility]   PHYSICIAN:  Caren Griffins B. Lorrene Reid, M.D.DATE OF BIRTH:  12/29/1950   DATE OF ADMISSION:  03/04/2008  DATE OF DISCHARGE:                              HISTORY & PHYSICAL   CHIEF COMPLAINT:  Dysuria and diarrhea.   HISTORY OF PRESENT ILLNESS:  The patient is a 60 year old female with a  history of hypertension and a renal transplant of her left kidney in  1996, here with dysuria, diarrhea, fever, hypotension, and abdominal  pain.  She had been having increased urinary frequency earlier this week  but then developed dysuria, diarrhea, fever, nausea, vomiting last  night, and also some decreased urine output and straining.  She is  complaining of lower abdominal pain around her bladder left greater than  right side, diarrhea 2-3 times today.  No blood in her stools, but  stools are dark because she says she takes iron.  She vomited x1 today  in the triage area but no other time today.  She also notes sores on her  bottom that come and go.  She has been taking Valtrex for these.  Creatinine was found to be 3.34 on January16, 2009.  This is the last  recorded creatinine that we have here in the hospital system.   PAST MEDICAL HISTORY:  1. Renal transplant July 29, 1995.  2. Anemia with a low total iron-binding capacity found March 15th.  3. Hypertension.  4. Diarrhea.  5. Gout.  6. Peripheral neuropathy.  7. Hyperlipidemia.  8. Anxiety, occasionally takes her husband's Valium.  9. Depression.  10.Gout.  11.Peripheral neuropathy.  12.She was hospitalized last December 9th through December 17th for a      persistent left lingular pneumonia.   PAST SURGICAL HISTORY:  1. History of a renal transplant.  2. Lumbar spine surgery with recurrent neck and lower back pain.   ALLERGIES:  No known drug  allergies.   MEDICATIONS:  1. Cardura 1 tablet p.o. b.i.d.  2. Labetalol 300 mg takes 2 tablets b.i.d.  3. Allopurinol 300 mg takes one half of a tablet daily.  4. Iron pills.  5. Vicodin 1 tablet 5/500 one to one and a half tablets p.r.n. q.4 h.      but takes this about every other day.  6. Cyclosporin 150 mg p.o. b.i.d. did not take this today.  7. Cellcept 750 mg one tablet p.o. b.i.d.  8. Valium uses her husband's medication sometimes.  9. Tylenol p.r.n.  10.Famotidine 40 mg daily.  11.Lasix 80 mg daily.  12.Zocor 20 mg nightly  13.Calcitriol 0.25 mcg daily.  14.Prednisone 5 mg daily.  15.Amitriptyline 25 mg takes a half a tablet nightly for sleep.  16.Promethazine 25 mg q.6 h. p.r.n.  17.Potassium chloride ER 20 mEq two tablets p.o. daily.   SOCIAL HISTORY:  The patient lives at home with her son and her husband.  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  She is married,  though she says she is not sexually active.  She did smoke one pack of  cigarettes per week for 4 years, not smoking currently.  No alcohol.  No  drug use.   FAMILY HISTORY:  Mom with some cardiac problems.  Father died at 73  years old with lung problems.  Sister has lung problems and brother had  colon cancer.   REVIEW OF SYSTEMS:  Positive for fevers, chills, sweats, headache,  decreased appetite, shortness of breath, polyuria, dizziness, abdominal  pain, incontinence of stool and diarrhea, nausea, vomiting, weakness,  dysuria and straining.  Denies a rash, edema, cough, chest pain, bright  red blood per rectum.   PHYSICAL EXAMINATION:  VITAL SIGNS:  Temperature 100.8, pulse 94,  respiratory rate 16, blood pressure 81/56.  It subsequently went up to  103/55 after a 500 ml bolus.  Oxygen 95% on room air.  GENERAL:  The patient is alert but yet drowsy.  HEENT:  Sclerae are clear.  CARDIOVASCULAR:  Heart regular rate and rhythm.  S1 and S2 audible.  Pulses 2 plus and equal.  No murmurs,  rubs, or gallops.  Dorsalis pedis  and radial pulses 2 plus.  LUNGS:  Clear to auscultation bilaterally without wheezes, rales, or  rhonchi.  SKIN:  Bilateral buttock areas with circular erythematous skin lesions  like the base of ruptured vesicles.  GI:  Soft, normal bowel sounds, some guarding in the left lower  quadrant, able to palpate transplanted kidney, mild tenderness around  kidney but worse and more tender over the lower mid abdomen in the  bladder area.  EXTREMITIES:  No cyanosis, clubbing, possible trace bilateral edema.  Warm and well perfused.  Left lower arm AV fistula with a positive  bruit.  NEUROLOGIC:  Alert and oriented x3.   LABORATORY:  White blood cell count 28.3, hemoglobin 12.6, platelets  161.  Sodium 130, potassium 3.6, chloride 100, bicarb 20, BUN 59,  creatinine 3.96, glucose 133.  Albumin 3.2, calcium 9.2.  Blood cultures  were ordered and are pending.  T-bili 0.9, ALP 88, total protein 7.3,  AST 19, ALT 9.   ASSESSMENT:  The patient is a 60 year old female with a history of a  renal transplant in 1996 of her left kidney with hypertension.  She is  here with dysuria, increased white blood cells, nausea, vomiting, and  diarrhea.   PLAN:  1. Renal/infectious disease.  She seems to have a UTI plus or minus      pyelonephritis of her transplanted kidney.  She could also have      urosepsis given low blood pressure.  We will check UA, urine      culture, blood cultures, start vancomycin and Zosyn, admit to      stepdown.  We will check a renal ultrasound to evaluate      transplanted kidney.  We will also give Solu-Medrol 30 mg IV daily      x48 hours and then switch to her daily dose of prednisone at 5 mg      daily.  Continue cyclosporin but decrease dose of Cellcept to 500      mg b.i.d. until she does not appear septic, maybe in 2 days.  We      will get records from her nephrologist's office, also an a.m. renal      panel.  Her creatinine is slightly  increased from her baseline but      this could just be due to her dehydration  from nausea and vomiting.      We will continue to follow.  2. Hypotension.  Could be secondary to sepsis or dehydration secondary      to nausea and vomiting.  We will give Phenergan p.r.n.  She is      currently status post normal saline bolus of 500 ml with good      results.  Her blood pressure seems to be much better at 103/55.  We      will continue normal saline at 120 ml/hr and give another bolus if      necessary.  Hold Lasix and potassium.  Hold labetalol, Cardura,      amitriptyline, all because of her low blood pressure.  3. Nausea and vomiting.  We will check her amylase and lipase though      this is likely secondary to her UTI versus pyelo.  We will also      check cardiac enzymes and an EKG.  Her LFTs are within normal      limits.  We will give Phenergan p.r.n.  4. Anemia.  We will check a CBC again in the morning.  She likely has      anemia of chronic disease.  We will heme check her stools though      because she says that she does have some dark stools since she is      on iron.  We will also get records from her primary doctor.  5. Secondary hyperparathyroidism.  Continue calcitriol.  6. Skin lesions on her buttocks.  We will get bacterial and HSV      cultures of these wounds.  7. Hyperlipidemia.  Continue Zocor.  8. Gout.  We will continue allopurinol.  9. Diarrhea.  We will C. diff stools x3 and heme check stools.  This      diarrhea is likely secondary to her UTI/pyelo.   DISPOSITION:  Pending resolution of UTI versus pyelo.      Rolly Salter, M.D.  Electronically Signed     ______________________________  Elzie Rings Lorrene Reid, M.D.    JH/MEDQ  D:  03/04/2008  T:  03/04/2008  Job:  [id number]

## 2011-04-28 NOTE — H&P (Signed)
NAMENOZOMI, MICKIEWICZ NO.:  [id number]   MEDICAL RECORD NO.:  [id number]          PATIENT TYPE:  OUT   LOCATION:  [facility]                          FACILITY:  [facility]   PHYSICIAN:  Louis Meckel, M.D.DATE OF BIRTH:  [birth year]   DATE OF ADMISSION:  11/21/2007  DATE OF DISCHARGE:                              HISTORY & PHYSICAL   CHIEF COMPLAINT:  Shortness of breath, dizziness, diarrhea and nausea.   HISTORY OF PRESENT ILLNESS:  This is a 60 year old African American  female with an extensive past medical history that includes chronic  kidney disease, stage IV, status post renal transplant, March 29, 1995.  The patient originally had some coughing and shortness of breath on  November 02, 2007; a chest x-ray was performed at that time which  revealed left upper lobe lingular pneumonia.  The patient was prescribed  to 10-day course of oral Augmentin therapy which provided some temporary  improvement.  However, the patient now presents to the [place] office complaining of persistent and worsening dyspnea on  exertion, productive cough with yellow-green phlegm, chills, decreased  appetite and increased fatigue for approximately the last 2 weeks.  She  is also complaining of left-sided chest soreness and diarrhea.  She is  in being admitted for further evaluation   PAST MEDICAL HISTORY:  As stated above, as well as:  1. History of lumbar surgical spinal surgery with recurrent pain in      the neck and lower back.  2. Gout.  3. Hypertension.  4. Obesity.  5. Depression.   MEDICATIONS:  1. CellCept 150 mg three tablets p.o. b.i.d.  2. Neoral 150 mg p.o. b.i.d.  3. Prednisone 5 mg daily.  4. Calcitriol 0.25 mcg daily.  5. Labetalol 300 mg two tablets p.o. b.i.d.  6. Furosemide 80 mg daily.  7. Cardura 8 mg p.o. b.i.d.  8. Flexeril p.r.n.  9. Vicodin p.r.n.  10.Allopurinol 300 mg one-half tablet p.o. daily.  11.Phenergan p.r.n.  12.Potassium chloride 20 mEq p.o. daily.  13.Zocor 20 mg daily.  14.Pepcid one tablet daily.  15.Amitriptyline 25 mg p.o. nightly.   ALLERGIES:  NO KNOWN DRUG ALLERGIES.   SOCIAL HISTORY:  The patient lives at home with her husband.  She  formerly worked at [employer] as a Clinical research associate.  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  She does have 2 sons, both of which are alive and well.  She denies any tobacco, alcohol or illicit drug use.  She has completed  3-1/2 years of college.   FAMILY HISTORY:  The patient's mother is 29 year old.  She does have  some cardiac issues, otherwise is healthy.  The patient's father passed  at age 46 secondary to some lung problems, exact cause unknown.  The  patient has 1 sister with lung problems and 1 brother that is a colon  cancer survivor.   REVIEW OF SYSTEMS:  GENERAL:  The patient has had some chills, decreased  appetite and increased fatigue for the last 2 weeks.  She denies any  fever, sweating, weight change or adenopathy.  HEENT: The patient  does  have some nasal congestion and vertigo.  She, however, denies any  headache, rhinorrhea, bleeding, photophobia, vision or hearing loss.  DERMATOLOGIC:  The patient denies any rashes, lesions or ulcerations.  CARDIOPULMONARY:  The patient has does have some dyspnea on exertion,  resolved after the patient sits and takes low deep breaths.  She also  has a productive cough with yellow-green thick phlegm and some wheezing  with exertion as well.  She otherwise denies any chest pain, although  she does complain of some left-sided pleuritic soreness.  The patient  denies any edema, palpitations or claudications.  ENDOCRINE:  Denies any  polyuria, polydipsia, heat or cold intolerance.  GU:  Denies any  frequency, urgency, dysuria, hematuria, nocturia or menopause.  NEUROPSYCHIATRIC:  The patient has had some increased generalized  weakness.  Otherwise, denies any numbness, depression or anxiety.   MUSCULOSKELETAL:  Denies any myalgias, arthralgias, joint swelling,  deformity or pain.  GASTROINTESTINAL:  The patient has had some episodes  of nausea for the last 2 weeks and gagging; however, no formal vomiting.  She also states she has had diarrhea for approximately the last 6 weeks;  she states she has 2-3 watery bowel movements daily.  Usually, she has 2-  3 formed bowel movements daily.  She has temporary relief with Imodium  and a questionable history of diverticulosis.  She is followed by Dr.  Olevia Perches as an outpatient and is due for a colonoscopy at this time.  Otherwise, denies any bright red blood per rectum, melena, hematemesis,  dysphagia, odynophagia or GERD.   PHYSICAL EXAMINATION:  VITAL SIGNS:  Temperature 98.1, pulse 68,  respirations 28, blood pressure sitting 150/84, blood pressure standing  102/60, weight 160 pounds.  GENERAL:  This is an alert and oriented, ill-appearing [place] American  female who appears stated age.  HEENT:  Head is normocephalic and atraumatic.  EOMs are intact.  The  patient has bilateral arcus senilis.  Sclerae are clear.  Nares are  patent with thin white discharge bilaterally.  Mucous membranes are dry.  Dentition is fair.  Oropharynx is without erythema or exudate.  NECK:  Supple without lymphadenopathy, thyromegaly, bruit or JVD.  CARDIOVASCULAR:  Regular rate and rhythm with an S1 and S2 noted..  A  grade 2/6 murmur is present.  Pulses are 2+ and regular.  RESPIRATORY:  Expiratory wheezing is noted in bilateral upper lobes and  right middle lobe; the patient also has a few scattered rhonchi  throughout.  DERMATOLOGIC:  Skin is warm and dry without rashes or lesions.  ABDOMEN:  Positive bowel sounds in all 4 quadrants.  Abdomen is soft,  nontender and nondistended with no rebound, guarding, masses or  organomegaly.  Transplanted kidney in left lower quadrant is nontender  to deep palpation.  EXTREMITIES:  No cyanosis, clubbing, edema,  rashes, lesions or  petechiae.  Hemodialysis access:  The patient has a left forearm pain AV fistula  with positive thrill and bruit.  MUSCULOSKELETAL:  No joint deformity, effusions or spinal tenderness.  NEUROLOGIC:  The patient is alert and oriented x3.  Cranial nerves II-  XII are intact.  No asterixis is present.   LABORATORY DATA:  WBC 7.9, hemoglobin 8.3; please note, hemoglobin on  November 14, 2007 was 9.5 and on July 28, 2007, it was 10.3, hematocrit  25.1, platelets 298,000.  Sodium 135, potassium 3.6, chloride is 101,  CO2 22, BUN 69, creatinine 3.4, glucose 90, calcium 9.3, albumin 3.5,  total protein  6.7, total bilirubin 0.7, AST is 18, ALT 8, lipase is 36,  amylase 88, alkaline phosphatase 83.  Please note serial serum  creatinines are as follows:  November 18, 2007, it was 3.71, November 14, 2007, it was 3.3, July 28, 2007 at 2.17, April 2008 at 2.5.   ASSESSMENT AND PLAN:  1. Shortness of breath and dyspnea on exertion with productive cough,      status post 10-day course of oral Augmentin for left upper lobe      pneumonia diagnosed November 02, 2007 via chest x-ray.  The patient      will be admitted.  A repeat chest x-ray will be performed.  She      will be prescribed intravenous antibiotic therapy as well as oxygen      therapy as needed.  We will also panculture the patient and rule      out a cardiac source for her dyspnea and left-sided chest pain with      EKGs and serial cardiac enzymes.  She will receive gentle      intravenous hydration, as-needed antiemetic therapy as well.  2. Diarrhea.  Stool samples will be collected for Clostridium      difficile culture and ova and parasites.  She can have renal as      needed for antidiarrheals as needed.  3. Anemia.  There has been a significant decrease in the patient's      hemoglobin within a short amount of time.  We will check her stool      for blood, follow her hemoglobins closely and transfuse as  needed.      The patient would also likely benefit from Epogen or Aranesp, which      will be deferred to the physician at this time.  Dr. Olevia Perches does      follow the patient as an outpatient.  She may need to be consulted      if gastrointestinal bleeding is noted.  4. Hypertension.  The patient's antihypertensive medications will be      held secondary to orthostasis noted at the time of the office      appointment and they may be restarted as needed.  5. Status post renal transplant with chronic kidney disease, stage IV.      The patient's laboratory work will be followed closely.  Again she      will receive gentle intravenous hydration.  She does have a      functioning arteriovenous fistula in her left upper extremity.  She      will continue her outpatient regimen of immunosuppression      medications at this time.  6. History of lumbar spinal surgery with recurrent neck and back pain.  7. Gout.  The patient will continue her outpatient regimen of      allopurinol.  8. The patient will return home with husband once stable.      Tracey P. Sherrod, NP    ______________________________  Louis Meckel, M.D.    TPS/MEDQ  D:  11/22/2007  T:  11/23/2007  Job:  [id number]

## 2011-04-28 NOTE — Discharge Summary (Signed)
[name], ZOTTOLA NO.:  [id number]   MEDICAL RECORD NO.:  [id number]          PATIENT TYPE:  INP   LOCATION:  [id number]                         FACILITY:  [facility]   PHYSICIAN:  Windy Kalata, M.D.DATE OF BIRTH:  07/17/1951   DATE OF ADMISSION:  03/04/2008  DATE OF DISCHARGE:  03/07/2008                               DISCHARGE SUMMARY   NEPHROLOGIST:  Dr. Florene Glen, who she sees at [facility].   DISCHARGE DIAGNOSES:  1. Left transplant kidney pyelonephritis.  2. Urosepsis with Escherichia coli, pansensitive bacteremia as well as      Escherichia coli in the urine.  3. Methicillin-resistant Staphylococcus aureus infection/cellulitis on      buttocks.  4. Hypertension.  5. Anemia.  6. Secondary hyperparathyroidism.  7. Hyperlipidemia.  8. Gout.  9. History of pulmonary nodule on CT, December 2008.  10.Hypotension, now resolved that was secondary to her urosepsis.   DISCHARGE MEDICATIONS:  1. Ciprofloxacin 500 mg daily for 11 days.  2. Doxycycline 100 mg 2 times a day for 10 days.  3. CellCept 750 mg two times a day.  4. Cardura 8 mg 2 times a day, this is her home dose.  5. Labetalol 300 mg two times a day.  6. Allopurinol 300 mg one-half a tablet daily.  7. Vicodin p.r.n., taken at home.  8. Cyclosporin 150 mg two times a day.  9. Famotidine 40 mg daily.  10.Lasix 80 mg daily.  11.Zocor 20 mg daily.  12.Calcitriol 0.25 mcg daily.  13.Prednisone 5 mg daily.  14.KCl 20 mEq.  15.Phenergan as needed every 6 hours.  She is to stop taking her iron      until after she finishes her antibiotics.   INSTRUCTIONS TO PATIENT/FOLLOWUP APPOINTMENTS:  She is to be on a renal  diet.  Activity as tolerated.  She is to keep the wound/lesions on her  buttocks clean and dry and wash with soap and water.   Followup appointments:  1. Lab for a urine culture, urinalysis, and a BMET on Monday morning,      March 12, 2008.  She is to go between 8 and 5 o'clock.  2.  Appointment with Dr. Florene Glen at [facility] on April 11, 2008,      at 8.45 a.m.   LABORATORY DATA:  Includes on discharge, the patient had a sodium of  139, potassium 3.5, chloride 107, bicarb 19, BUN 61, creatinine 3.61,  glucose of 130, calcium 9.4, albumin 2.4, and phosphorous 3.8.  White  blood cell count 21.3, hemoglobin 10.9, platelets 123.  Urine culture  showed E. coli that was pansensitive.  Cyclosporin level was 462 on  March 05, 2008 and the level is 52 in the morning.  Wound cultures  showed abundant MRSA that was sensitive to gentamicin, rifampin,  Bactrim, vancomycin, and tetracycline but resistant to penicillin,  oxacillin, levofloxacin, erythromycin, and clindamycin.  Blood cultures  showed E. coli pansensitive except intermediately sensitive to imipenem.  Urine cultures showed E. coli that was pansensitive.  The patient was  fecal occult blood negative.  Cardiac enzymes were normal x1 set,  troponin of 0.02, CK-MB of 0.9, creatinine kinase of 79, amylase was  slightly elevated to 151, and lipase was normal at 31.  Urinalysis  showed many bacteria, 7-10 red blood cells, white blood cells too  numerous to count, leukocytes large, nitrite negative, blood moderate,  protein 100, very turbulent in appearance, specific gravity 1.017.  C.  diff negative x1.  Her discharge labs were much different from her  admission labs.  On admission, her white blood cell count was 28.3,  hemoglobin was 12.6.  She had an ANC of 26.6, and neutrophil count of  94%.  Her creatinine was 3.96 on admission and subsequently increased  prior to decreasing, and increased to 4.5 and then finally down to 4.46,  and then on the day of discharge with 3.61.  Her baseline is 3.34 from  January 2009.  Studies include a renal ultrasound that showed no  evidence of renal transplant hydronephrosis or major vascular  abnormality.  There was a suggestion of minimal pelvocaliectasis, but no  overt  hydronephrosis and a chest x-ray showed cardiomegaly and pulmonary  venous hypertension, hyperaeration.   CONDITION ON PATIENT AT DISCHARGE:  The patient is doing quite well.  She has very very minimal suprapubic pain.  No pain on the left side of  her kidney.  Her diarrhea is decreasing.  She is actually in the more  hypertensive side and she is doing well walking around on p.o.  antibiotics, and will be discharged home.   HOSPITAL COURSE:  The patient is a 60 year old female who is admitted  with urosepsis secondary to likely pyelonephritis of her left transplant  kidney.  1. Urosepsis.  She initially come in with hypotension.  This resolved      with only 500 mL bolus of normal saline as well as fluids      overnight.  This was presumed secondary to her urosepsis.  Urine      culture was found to be growing pansensitive E. coli as well as      blood cultures.  She was initially started on vancomycin and Zosyn.      Her Zosyn was discontinued, and we found that she had gram-negative      rods in her urine and blood.  She was continued on the Zosyn until      sensitivities came back.  She was then started on Cipromycin p.o.      at the renal dosing of 500 mg daily.  She will be on this for a      total of 2 weeks at least, but we will get a urinalysis and a urine      culture on Monday ensure care.  Clinically, she is very improved at      the time of discharge, only minimal suprapubic pain.  A renal      ultrasound was done to evaluate the transplanted kidney, and did      not show any major abnormalities, just a possible minimal      pelvocaliectasis.  2. History of left renal transplant.  The patient was continued on her      home doses of cyclosporin and CellCept.  The CellCept dose was      initially decreased on the first day, but then she was started back      on her 750 mg two times a day the second day of her      hospitalization.  She was also initially put on IV  Solu-Medrol, but      this was tapered down to her normal dose of prednisone at 5 mg, and      she will be discharged on this.  She did not have an increased      cyclosporin level of 462, though I do not believe that this was a      true trough.  This may need to be followed up as an outpatient with      another level.  3. Acute renal failure.  This was likely due to her urosepsis and      pyelonephritis.  This resolved slowly with a resolution of her      infection.  She did have great urine output at the time of      discharge and decrease in creatinine.  Her creatinine was 3.61 at      the time of discharge, her baseline is 3.34.  this will be followed      up on Monday with labs at [facility] that will be sent to [place], Dr. Florene Glen.  4. Hypertension.  The patient initially had come in hypotensive.  We      held all her blood pressure medicines, but then at the time of      discharge, she was hypertensive, and she was restarted on her home      Cardura and labetalol and when she goes home, she will also be      taking her normal dose of Lasix and potassium.  Her amitriptyline      was also initially held, but this was also restarted during her      hospitalization because of her great blood pressures.  5. Buttock skin lesions.  The patient has had recurrent outbreaks of      areas on her buttocks that look like the base of ruptured vesicles.      These were cultured and found to be positive for MRSA.  She will go      home on a course of doxycycline 100 mg b.i.d. for 10 days.  These      lesions did look much better at the time of discharge, and they did      on admission, and they were drying up, this is likely due to the      vancomycin and the Zosyn she got here in the hospital.  6. Anemia.  The patient is anemic as an outpatient as well.  We      stopped her iron here because she was having an acute infection.      She can start this back when she finishes her  antibiotics.  7. Secondary hyperparathyroidism.  We continued her on her calcitriol.      Her calcium and phosphorous were within normal limits at discharge.  8. Hyperlipidemia.  She was continued on her Zocor.  9. Gout.  She was continued on her allopurinol.  10.Anxiety/depression.  She was continued on her amitriptyline once      her blood pressure is increased to normal limits.  11.History of lung nodule.  This is being followed by Dr. Florene Glen as an      outpatient.  The patient is aware this as well.  We will leave this      up to Dr. Florene Glen to determine when she needs followup.  12.Diarrhea.  We did check her stools for blood as well as C. diff and  they were both negative.  We are thinking that this might be due to      her CellCept.  She evidently has chronic diarrhea, though I think      that some of this diarrhea was due to her acute infection.  13.Nausea and vomiting.  The patient initially presented with nausea      and vomiting.  Amylase and lipase were not impressive, also set of      cardiac enzymes were negative.  We felt that this was likely due to      her UTI/pyelonephritis.  Her LFTs were within normal limits.  She      was given Phenergan p.r.n. and this did resolve very quickly with      treatment of urosepsis/pyelonephritis.  14.Followup issue.  She is to have a urinalysis and urine culture and      a BMET checked on Monday at [place].  Also, may      need to discuss her CellCept dose, as she seems to have chronic      diarrhea a lot of the time.  Also, we will need followup for her      pulmonary nodule seen on the CT on December 2008 and may need      another cyclosporin level checked to make sure she has normal      trough levels.      Rolly Salter, M.D.  Electronically Signed     ______________________________  Windy Kalata, M.D.    JH/MEDQ  D:  03/07/2008  T:  03/08/2008  Job:  [id number]

## 2011-05-01 NOTE — Op Note (Signed)
NAME:  [name], [name]                       ACCOUNT NO.:  [id number]   MEDICAL RECORD NO.:  [id number]                   PATIENT TYPE:  OIB   LOCATION:  2860                                 FACILITY:  [facility]   PHYSICIAN:  Rosetta Posner, M.D.                 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   PROCEDURE:  Repair with resection of venous aneurysm and reanastomosis of  cephalic vein to radial artery.   SURGEON:  Rosetta Posner, M.D.   ASSISTANT:  Jacinta Shoe, P.A.   ANESTHESIA:  MAC.   COMPLICATIONS:  None.   DISPOSITION:  To recovery room stable.   DESCRIPTION OF PROCEDURE:  The patient was taken to the operating room and  placed in the supine position where the area of the left arm was prepped and  draped in the usual sterile fashion using local anesthesia.  Incision was  made through the prior scar and carried down to isolate the arteriovenous  anastomosis.  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  The vein was  mobilized further proximally and was of normal caliber for a fistula.  The  radial artery was occluded proximally and distally.  There was pulsatile  flow through the palmar arch into the fistula as well.  A radial artery was  occluded proximally and distally and the cephalic vein was occluded with a  Seraphim clamp.  The venous aneurysm was transected and was resected.  The  vein was mobilized and able to be reanastomosed and was sewn end-to-side to  the radial artery at the prior anastomosis with a running 6-0 Prolene  suture.  Anastomosis was tested and found to be adequate.  Clamps  were  removed and excellent thrill was noted.  The wound was irrigated with  saline.  Hemostasis with electrocautery.  The wound was closed with 3-0  Vicryl in the subcutaneous and subcuticular tissue.  Benzoin and Steri-  Strips were applied.                                               Rosetta Posner, M.D.    TFE/MEDQ  D:  08/25/2004  T:  08/25/2004  Job:  [id number]

## 2011-05-01 NOTE — Discharge Summary (Signed)
. [facility]  Patient:    [name], [name]                      MRN: [id number] Adm. Date:  [id number] Disc. Date: [id number] Attending:  Clearnce Sorrel                           Discharge Summary  REASON FOR ADMISSION:  Craig Wahler is a 60 year old renal transplant patient who presented to the emergency room on November 21, 2000, with severe back and bilateral leg pain.  She was found to have a urinary tract infection and was started on oral antibiotics.  She was seen by the renal transplant service and was felt to have an exacerbation of low back pain with baseline spinal stenosis.  She had urinary retention and had a Foley catheter.  The patient had a fever.  She had Klebsiella bacteria on her urinalysis and a flare of gout.  She was started on antibiotics consisting of ciprofloxacin. The patient was treated with Vioxx of flare of her gout.  It was felt that she was improving on November 25, 2000, and she was discharged home at that point. She was scheduled of an outpatient myelogram of her lumbar spine on December 08, 2000.  DISCHARGE MEDICATIONS:  Ciprofloxacin 500 mg once daily and Vicodin 5/500 mg one or two every four as needed for pain along with her regular medications.  ACTIVITY:  To have no lifting, pulling, or twisting.  DIET:  To eat a decreased salt diet.  FOLLOW-UP:  To follow up with the [facility] for TENS application and instructions at 10:30 a.m. on November 26, 2000.  To set up an appointment for lumbar myelogram through my office. DD:  01/28/01 TD:  01/28/01 Job: [id number]

## 2011-05-01 NOTE — H&P (Signed)
[place]  Patient:    [name], [name]                      MRN: [id number] Adm. Date:  [id number] Attending:  Clearnce Sorrel                       History and Physical  ADMITTING DIAGNOSES:  1. Lumbar stenosis.  2. Low back pain.  3. Urinary retention.  4. Rule out urinary tract infection.  HISTORY OFPRESENT ILLNESS: The patient is a 60 year old right-handed lady who is arenal transplant patient.  She developed severe back and bilateral leg pain yesterday in the morning.  She notes the pain initially started on her left side but then began to radiate over to the right side.  The pain has been severe and unrelenting despite bed rest, Flexeril, and Vicodin ES.  This afternoon she complained of urinary retention and being unable to voidand I advised that the patient come to the emergency room for evaluation.  On catheterization she had approximately 200-250 cc of urine within the Foley catheter.  A catheter was left in place.  The patient notes she has not been able to get up and move about and required the help of two individuals to get her into a vehicle to get to the hospital.  The patient had a similar episode of severe unrelenting back pain on September 24, 2000 and was admitted for four days.  She was found to have a urinary tract infection and also was found to have spinal stenosis on a CAT scan performed of her lumbar spine at that time.  Marchia Meiers. Vertell Limber, M.D. from my office had seen the patient and has seen her subsequently in follow-up, and she had improving symptoms over a period of time but because the symptoms had not really completely gone away she contacted our office today withrecurrent exacerbation.  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  CURRENT MEDICATIONS:  1. CellCept 100 mg b.i.d.  2. Prednisone 7.5 mg q.d.  3. Neoral 150 mg p.o. b.i.d.  4. Progesterone 200 mg h.s.  5. Restoril 50 mg h.s. p.r.n.  6. Cardura 8 mg q.d.  7. Labetalol 300 mg b.i.d.  8. Protonix 40 mg q.d.  9. Lasix 80 mg q.d.  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  SOCIAL HISTORY: The patient isa nonsmoker and a nondrinker.  REVIEW OF SYSTEMS: As noted in the History of Present Illness, otherwise negative review of 14 point system.  PHYSICAL EXAMINATION:  VITAL SIGNS: Blood pressure 160/76, heart rate 88 and regular, respirations 16, temperature 100.5 degrees.  GENERAL: She is a well-developed, well-nourished individual in no overt distress while lying comfortably; however, when turning onto her side she develops significant and very severe back pain.  SKIN: Warm and dry.  BACK: Diffusely tender to palpation.  NECK: Supple.  Normal range of motion.  LUNGS: Clear to auscultation.  HEART: Regular rate and rhythm.  No murmurs heard.  ABDOMEN: Positive bowel sounds.  Notable discomfort in the suprapubic region. No rebound tenderness is noted.  The patient notes that she has significant bilateral hernias that need  to be repaired per her transplant surgeon.  NEUROLOGIC: Her pupils are 4 mm and briskly reactive to light and accommodation.  Extraocular movements are full.  The face is symmetric to grimace.  Tongue and uvula are midline.  Sclerae and conjunctivae are clear. Fundi reveal the discs to be flat.  The upper and lower extremities have good motor strengthto confrontation; however, movement of the lower extremities reveals there is significant pain in the lower lumbar spine with any movement. Motorstrength appears intact.  Deep tendon reflexes are 1+ in the patella, trace in  the Achilles.  Babinskis are downgoing.  Upper extremity reflexes are 2+ and symmetric.  EXTREMITIES: No clubbing, cyanosis, or edema. Pulses are intact.  IMPRESSION/PLAN: The patient has evidence of asignificant amount of back pain with significant spasm.  At this time I am concerned that low-grade temperature may be an indication that she hasa fever and we will culture the urine and the blood and obtain some baseline laboratory studies, and observe the patient.  We will also obtain some baseline x-rays of the lower lumbar spine.  I have contacted Ollen Gross C. Florene Glen, M.D. regarding this patient so that any adjustments needed in thepatients medication regimen can be made. DD:  11/21/00 TD:  11/22/00 Job: [id number]

## 2011-05-01 NOTE — Op Note (Signed)
[name], [name]             ACCOUNT NO.:  [id number]   MEDICAL RECORD NO.:  [id number]          PATIENT TYPE:  INP   LOCATION:  3007                         FACILITY:  [facility]   PHYSICIAN:  Earleen Newport, M.D.  DATE OF BIRTH:  May 16, 1951   DATE OF PROCEDURE:  12/08/2005  DATE OF DISCHARGE:                                 OPERATIVE REPORT   PREOPERATIVE DIAGNOSES:  1.  Lumbar spondylosis with spondylolisthesis L4-L5.  2.  Lumbar radiculopathy.  3.  Lumbar stenosis.   POSTOPERATIVE DIAGNOSES:  1.  Lumbar spondylosis with spondylolisthesis L4-L5.  2.  Lumbar radiculopathy.  3.  Lumbar stenosis.   SURGICAL PROCEDURE:  Insertion of X-STOP L4-L5.   SURGEON:  Earleen Newport, M.D.   ANESTHESIA:  General endotracheal plus local 30 mL of 1% lidocaine with  epinephrine mixed 50:50 with  0.5% Marcaine.   ESTIMATED BLOOD LOSS:  150 mL.   INDICATIONS:  [name] is a 60 year old individual who has had renal  failure and is status post renal transplant. She has had significant  spondylosis with spondylolisthesis at the L4-L5 level. She has been advised  regarding surgical decompression arthrodesis.   DESCRIPTION OF PROCEDURE:  The patient was taken to the operating room on  December 08, 2005, and was supine on the stretcher after smooth induction of  general endotracheal anesthesia. She was turned prone, and her back was  prepped with DuraPrep, draped in sterile fashion. Midline incision was  created in the lumbar dorsal spine, and the dissection was taken down to  lumbodorsal fascia.  The skin was infiltrated with 30 mL of lidocaine with  epinephrine, and the fascia was similarly infiltrated with this solution.  The fluoroscope was used to localize the L4-L5 space, and then paraspinous  musculature was taken down on the left and the right side to expose  interlaminar space at the L4-L5 space. Then a small curved awl was placed  through the spinous process in the  interspinous ligament,  and an enlarging  awl was used behind this.  Then the interspinous spreader was placed in the  interspinous space at L4-L5, and then, under fluoroscopic visualization,  interspinous ligament was distracted. This allowed for distraction to the 14  mm size.  A 12 mm size X-STOP was then placed into the interspinous space.  This was locked into the appropriate position, and final radiographs were  obtained in the AP and lateral projection.  Lumbodorsal  fascia was closed with #1 Vicryl in interrupted fashion, and  2-0 Vicryl was  used in the subcutaneous tissues and 3-0 Vicryl subcuticularly. Blood loss  was estimated at 150 mL. The patient tolerated the procedure well and was  returned to recovery in stable condition      Earleen Newport, M.D.  Electronically Signed     HJE/MEDQ  D:  12/08/2005  T:  12/08/2005  Job:  [id number]

## 2011-05-01 NOTE — Discharge Summary (Signed)
[place]  Patient:    [name], [name]                      MRN: [id number] Adm. Date:  [id number] Disc. Date: [id number] Attending:  [name] Dictator:   Myriam Jacobson, P.A.C. CC:         Alvin C. Florene Glen, M.D.   Discharge Summary  DISCHARGE DIAGNOSES: 1. Severe spinal stenosis and left greater than right lateral recess    stenosis. 2. 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 3. Fevers. 4. Hypertension. 5. Iron deficiency anemia.  CONSULTATIONS:  Marchia Meiers. Vertell Limber, M.D., neurosurgery.  PROCEDURES:  MRI of the spine 09/24/00 showed prominent circumferential bulge at L4-L5 with probable lateral disk herniation along with advanced facet arthropathy and lateral spinal stenosis.  Moderate circumferential vault at L3-L4 with possible small central hernia nucleated pulposis.  No significant findings at L2-L3 or L5-S1.  HISTORY OF PRESENT ILLNESS:  [name] is a 60 year old white female with biopsy proven mesangial proliferative glomerulosclerosis who started dialysis in 1993 and received a cadaveric renal transplant in April 1996.  She also had a problem with hypertension.  She arrived in the emergency room via ambulance due to severe progressive worsening left lower back pain radiating into the left hip and going down into the left anterior lateral leg.  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  The pain was so bad the patient went to the Urgent Care the day prior.  She was told she had lumbar muscle strain and referred to physical therapy.  She also was described as having a urinary tract infection based on pyuria, and she was treated with Septra which she started the day prior to admission.  On the day of presentation, her pain is so terrible she cannot walk.  She called EMS to transport her to the emergency room.  In the emergency room she was very anxious with  pain making the exam difficult to decipher.  There were no chills.  Temperature 99.3.  Yesterday the patient had mild dysuria for 24 hours.  She has no nausea or vomiting, no chest pain, no numbness or tingling of her lower extremities.  No lower extremity weakness. No abdominal pain.  No Allograft pain.  PHYSICAL EXAMINATION:  Temperature in the emergency room was 98.7, blood pressure 160/70, pulse 82 and regular.  The remainder of the physical exam is outlined in the admission history and physical.  LABORATORY DATA:  White count 11.3 with 80% neutrophils, 9% lymphocytes, 8% monocytes.  Platelets 232,000.  Negative Hemoccults.  Sodium 136, potassium 3.5, chloride 100, CO2 26, glucose 100, BUN 44, creatinine 2.5, calcium 9.4, albumin 3.3, total protein 6.6.  Liver function tests within normal limits.  HOSPITAL COURSE:  The patient was admitted to the renal unit and placed in a private room due to her transplant status.  MRI was checked which confirmed herniated disk.  Surgery consult was provided by Dr. Erline Levine.  He placed her on a prednisone taper with bed rest.  Results from the Urgent Care, culture and sensitivity showed no growth.  Pyuria was described as 5-10 white cells.  She was continued on Septra and empirically treated with Ancef as well.  She was given IV fluids.  Repeat white count on day #2 showed significant increase to 19,000 which was felt secondary to the steroids and this decreased to 13,700 on  October 23, 2000.  Hemoglobin decreased slightly 9.3.  Iron studies were checked and were not available at the time of discharge.  This will be followed in the outpatient setting.  These results did show 10% saturation, total iron 26 and ferritin 104.  Her antibiotics were changed to Levaquin.  She continued to improve with the prednisone taper. Plans were to discharge the patient home on a prednisone taper after physical therapy evaluation on October 07, 2000.  She will  follow up with Dr. Erline Levine in the outpatient setting to determine whether further intervention is needed.  Fevers resolved spontaneously without any specific etiology other than previously thought urinary tract infection.  DISCHARGE LABORATORY DATA:  Sodium 137, potassium 4.2, chloride 102, CO2 28, glucose 114, BUN 59, creatinine 2.0 which is near baseline and calcium 9.6.  DISCHARGE CONDITION:  Improved.  DISCHARGE MEDICATIONS: 1. CellCept 250 mg four capsules twice a day. 2. Lasix 80 mg per day. 3. Prilosec 20 mg per day. 4. Labetalol 300 mg b.i.d. 5. Cardura 8 mg per day. 6. Neoral 100 mg b.i.d. 7. Levaquin 250 mg per day x 4 more doses. 8. _____ patch 0.375 mg to skin Monday through Thursday. 9. Prednisone taper, October 16 (20 mg), October 17 (10 mg),    October 18 (7.5 mg) and continue on usual dose.  DISCHARGE INSTRUCTIONS:  No lifting, no working until seen by Dr. Vertell Limber.  DISCHARGE DIET:  No added salt.  FOLLOW-UP INSTRUCTIONS:  Check with outpatient physical therapy as prescribed. Follow up with Dr. Florene Glen at her usual time.  The patient is to remain out of work until evaluation by Dr. Vertell Limber on October 04, 2000. DD:  11/17/00 TD:  11/17/00 Job: [id number]

## 2011-05-01 NOTE — H&P (Signed)
[place]  Patient:    [name], [name]                      MRN: [id number] Adm. Date:  [id number] Attending:  Tor Netters Dictator:   [name], P.A. CC:         [place] C. Florene Glen, M.D.  Marchia Meiers. Vertell Limber, M.D.   History and Physical  REASON FOR ADMISSION:  Herniated disk.  HISTORY OF PRESENT ILLNESS:  A 60 year old black female with biopsy proven mesangio proliferative glomerulonephritis started on hemodialysis in 1993, then received a cadaveric renal transplant in April 1996.  The patient is followed by Dr. Erling Cruz at [facility]  She comes to the [facility] Emergency Room on the day of admission via ambulance secondary to severe and progressively worsening left lower back pain radiating anteriorly through the left hip and going down the left anterolateral leg.  Her symptoms started on October 10, one day after helping push riverbed rocks at work.  the pain started on October 10 and became so bad on October 11 that she went to the Urgent [place], where they diagnosed her with lumbar muscle strain and a UTI based on pyuria.  She was referred to physical therapy, given Vicodin and Septra prescriptions.  She started taking the Septra yesterday.  Today, the pain became so terrible she was unable to ambulate and called Emergency Medical Services to transport her to the ER.  In the ER, she was very anxious, with pain being the limiting factor on exam.  She states she had a low grade temperature yesterday of 99.3, but denies chills.  She stated that she had mild dysuria prior to starting Septra.  She denies nausea, vomiting, chest pain, numbness or tingling of her lower extremities.  She denies lower extremity weakness.  No abdominal pain and no pain over her allograft in the left lower quadrant.  ALLERGIES:  No known drug allergies.  CURRENT MEDICATIONS:  CellCept, 100 mg b.i.d.  ______  125 mg b.i.d. Prednisone 7.5 mg q.d.  Normodyne 300 mg b.i.d.  Prilosec 20 mg q.h.s. Cardura 8 mg q.d.  Lasix 80 mg q.d.  Vizelle patch 0.0375 mg, one patch to skin every Monday and Thursday.  Prometrium 200 mg q.d.  Colchicine 0.6 mg p.r.n.  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 The patient had a cadaveric renal transplant done in April 1996 with chronic renal failure and a baseline creatinine of 2.0 to 2.5.  Hypertension.  History of gingival hyperplasia improved off Procardia.  Recurrent right inguinal hernia.  History of cervical and vaginal dysplasia requiring cryosurgery. Severe menorrhagia status post hysterectomy and hormone replacement therapy. Gout.  Right toe fractures secondary to trauma.  SOCIAL HISTORY:  The patient is a nonsmoker and nondrinker.  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  REVIEW OF SYSTEMS:  As in the HPI.  PHYSICAL EXAMINATION:  VITAL SIGNS:  Blood pressure 160/70, pulse 82 and regular, temperature 98.7, respirations 16.  GENERAL:  Well-developed, well-nourished black female in moderate discomfort secondary to back pain.  NECK:  Supple with full range of motion.  LUNGS:  Clear to auscultation.  HEART:  Regular rate and rhythm.  ABDOMEN:  Positive bowel sounds.  Soft with positive back pain on palpation of the abdomen.  The abdomen itself appears to be nontender.  There  is no pain over the allograft in the left lower quadrant.  There is no rebound and no organomegaly.  EXTREMITIES:  Positive pain to palpation of the LS spine and left lower back. Positive back pain with bilateral leg raises.  Sensory and reflexes are grossly intact in the lower extremities bilaterally.  Dorsalis pedis and popliteal pulses are 2+ bilaterally.  LABORATORY DATA:  White count 11,300, hemoglobin 10.4, hematocrit 30.8, platelets 232,000.   Sodium 136, potassium 3.5, chloride 100, CO2 26, glucose 100, BUN 44, creatinine 2.5.  LFTs within normal limits.  Albumin 3.3, calcium 9.4.  Urinalysis showed clear yellow urine; specific gravity 1015; pH 5; trace positive for blood; negative for leukocytes, nitrites, ketones, bilirubin, glucose and protein; 0-5 white blood cells and red blood cells per high power field.  CAT scan of the LS spine reveals positive left lateral herniation of the L4-5 disk with 4+ facet arthropathy and relative spinal stenosis.  ASSESSMENT AND PLAN: 1. 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  Plan to admit the patient.  Place at bed    rest.  Use stress steroid taper, analgesics and a neurosurgery    consultation. 2. History of dysuria with low grade temperature and pyuria reportedly by    Urgent Care Center.  Will repeat urine culture.  Continue Septra for now    until culture report is back. 3. Cadaveric renal transplant.  Follow BUN and creatinine.  Continue    immunosuppressant therapy. 4. Hypertension.  Continue Normodyne and Cardura. DD:  09/24/00 TD:  09/24/00 Job: [id number]

## 2011-05-01 NOTE — Discharge Summary (Signed)
[name], [name]             ACCOUNT NO.:  [id number]   MEDICAL RECORD NO.:  [id number]          PATIENT TYPE:  INP   LOCATION:  5509                         FACILITY:  [facility]   PHYSICIAN:  James L. Deterding, M.D.DATE OF BIRTH:  [birth year]   DATE OF ADMISSION:  09/26/2004  DATE OF DISCHARGE:  10/02/2004                                 DISCHARGE SUMMARY   CONSULTATIONS:  1.  Pulmonary, Dr. Asencion Noble.  2.  Infectious Disease team.   ADMISSION DIAGNOSES:  1.  Febrile illness with multiple etiologies, pneumonia versus viral      syndrome.  2.  Status post renal transplant.  3.  Hypertension.   DISCHARGE DIAGNOSES:  1.  Community-acquired pneumonia.  2.  Renal transplant with mild renal insufficiency, resolved at discharge.  3.  Hypertension.  4.  Anemia.  5.  Herpes zoster.   BRIEF HISTORY/REASON FOR ADMISSION:  [name] is a 60 year old black  female had history of renal transplant since 1996, hypertension and also  known history of hyperlipidemia who reported to the emergency room with a 4-  day history of neck pain, headache, abdominal discomfort, nausea, fever to  101, dry cough, increasing right-sided pleuritic chest pain with shortness  of breath.  Seen in the ER by Dr. Florene Glen and Leafy Kindle, PA and noted ER  documentation.  She was scheduled for a lumbar fusion by Dr. Ellene Route with a  workup on October 13th showing that she had a white count of 21,000 and a  creatinine of 2.2 on labs he did.   Physical exam on admission showed temperature 101.4 orally.  Blood pressure  was 142/80 supine, 120/70 sitting, 88/60 standing.  General appearance  revealed an ill-appearing black female, alert, oriented x3.  Neck was  supple.  Lungs revealed right basilar mild crackles.  Abdomen with bowel  sounds positive, within normal limits.  The abdomen was soft, tenderness  throughout but negative rebound.  Transplanted kidney was nontender to  palpitation.  Extremities  revealed bilateral lower extremity edema and old  AV Gore-Tex graft nontender, no evidence of infection.   Lab data as noted above.   Dr. Florene Glen admitted to for febrile illness with etiology to be determined  with abdominal CT scan and chest x-ray to be obtained and empiric  antibiotics started.   HOSPITAL COURSE:  Problem 1.  COMMUNITY-ACQUIRED PNEUMONIA:  The patient's  chest x-ray initially was negative.  CT scan of abdomen and pelvis showed a  lower lobe infiltrate consistent with right bronchitis versus early  pneumonia, no acute abdominal findings were present.  Transplanted kidney  appeared within normal limits.  The patient's antibiotics were changed to  Tequin by Dr. Jonnie Finner.  Infectious disease was consulted to help with  further treatment regimen in this patient who is renal compromised from her  renal transplant.  Dr. _____________ saw the patient on September 27, 2004 and  commented that it appeared that Ms. Hickey had community-acquired pneumonia;  however, she had scant pulmonary symptomatology initially.  The etiology  could be viral, atypical bacteria, e.g., Chlamydia, Mycoplasma or  Legionella.  Current therapy  was added for that.  The patient's workup with  sputum culture was unremarkable.  She had a throat culture which was  negative for influenza type A and B.  Blood cultures obtained showed no  growth.  Urinalysis showed a few bacteria, 0-2 white cells and 0-2 rbc's,  nitrites were negative.  White count on admission was down to 7.8 by the  18th with IV antibiotic therapy.  In addition to ID, the patient was seen in  consultation by pulmonology by Dr. Asencion Noble and Dr. Danton Sewer.  Dr.  Asencion Noble saw the patient initially to help assess her pneumonia  therapy.  He agreed with IV Avelox, continue nebulizer therapy which she was  getting and add flutter valve incentive spirometry.  The patient was  clinically improving with fever improving, white cells  improving and chest x-  ray improving somewhat.  Noted on the 19th, Dr. Gwenette Greet cleared the patient  for discharge from a pulmonary standpoint and did not expect a clear chest x-  ray for another 4-6 weeks after discharge.  She was discharged home on  Avelox 400 mg daily for 8 more days on the 20th.  Noted by Dr. Michel Bickers, the patient would need a follow up flu vaccine as an outpatient  after recovering from this illness.   Problem 2.  RENAL TRANSPLANT/RENAL INSUFFICIENCY:  The patient noted to have  a creatinine of 2.2 on admission.  IV fluids were given for general  hydration and creatinine improved to 1.8 the next day and was down to 2 near  discharge.  She had good urine output.  She had a BUN of 46.  She was to  have follow up with Dr. Erling Cruz in the office as well as with [facility] in 2-3 weeks post discharge for followup.   Problem 3.  ANEMIA:  Noted the patient's hemoglobin dropped from 11.6 to 10,  and then was 9.5 near discharge.  Etiology of her anemia is thought to be  secondary to her acute illness and renal transplant.  The patient was  discussed with discharging nephrologist.  Therapy was to follow up  hemoglobin as an outpatient for now.   Problem 4.  HYPERTENSION:  Noted on admission, the patient was orthostatic  with blood pressure supine [id number] systolic to standing 80 systolic.  This was  thought to be due to her acute illness.  At time of discharge, blood  pressure was 113/81 and pressure was stable.  She will continue her  hypertension meds of labetalol 300 mg b.i.d., Cardura 8 mg daily, Lasix and  also potassium supplements secondary to her Lasix therapy.   Problem 5.  HERPES ZOSTER:  Noted on the day of discharge, the patient  complained of sores on my bottom but this usually occurs when I get sick.  She was noted to have on her coccyx at S1 dermatome circular to scabbing lesions x3 consistent with herpes zoster as discussed with Dr.  Jimmy Footman.  Therapy will be Valtrex 1 g daily for 7 days.   ADMISSION MEDICATIONS:  1.  Avelox 400 mg daily for 8 more days.  2.  Calcitriol 0.5 mg daily.  3.  Valtrex 1 mg daily for 7 days.  4.  CellCept 750 mg b.i.d.  5.  Neoral 10 mg b.i.d.  6.  Labetalol 300 mg b.i.d.  7.  Cardura 8 mg daily.  8.  Allopurinol 150 mg daily.  9.  Prednisone 10 mg daily.  10. Lasix 40 mg daily.  11. KCl 20 mEq daily.  12. Nexium 40 mg daily.   ACTIVITY:  As tolerated.   DIET:  No added salt, low fat.   FOLLOWUP:  Appointment with Dr. Erling Cruz in the office as they call you.       DWZ/MEDQ  D:  12/11/2004  T:  12/11/2004  Job:  [id number]   cc:   Darrold Span. Florene Glen, M.D.  [address]  [place]  [place] [address]  Fax: [phone]

## 2011-05-01 NOTE — Consult Note (Signed)
[name], [name]             ACCOUNT NO.:  [id number]   MEDICAL RECORD NO.:  [id number]          PATIENT TYPE:  INP   LOCATION:  3022                         FACILITY:  [facility]   PHYSICIAN:  Caren Griffins B. Lorrene Reid, M.D.DATE OF BIRTH:  Sep 05, 1951   DATE OF CONSULTATION:  08/20/2005  DATE OF DISCHARGE:                                   CONSULTATION   [name] is a 60 year old black female who has a history of end-stage  renal disease and is status post renal transplant in 1996. She is here at  [facility] today for surgery by Dr. Ellene Route for cervical disk  disease with associated radiculopathy and hand numbness. Her creatinine on  preoperative labs two days prior to surgery was reported at 2.7 and repeat  today was 3.2. We were asked to see her regarding the safety of proceeding  with surgery in the setting of her chronic kidney disease.   On review of information from office, serial creatinines since December 2005  are as follows:  December 2005, 2.1; March 2005, 2.1; June 2006, 2.5;  September 2006 2.8 and repeat 3.2. There have been no recent changes in  urine output, increases edema or drastic changes in blood pressure. Her only  new medication of late is Remeron which was started about a month ago. She  has not used any nonsteroidals. She has had no fevers, chills or allograft  tenderness. Of note, despite her chronic kidney disease in her transplant,  she has never had a transplant biopsy.   Her only other issue is that of anemia which she has had for some time and  has not received EPO or Aranesp with preoperative labs showing a hemoglobin  of around 10 for which she was typed and crossmatched.   PAST MEDICAL HISTORY:  1.  ESRD on hemodialysis for 2-1/2 years in the past.  2.  Status post renal transplant in 1996 with chronic kidney disease.  3.  Hyperlipidemia.  4.  Anxiety, depression.  5.  Secondary hyperparathyroidism.  6.  Hypertension.  7.  History of lumbar  spinal stenosis and cervical disk disease.  8.  Gout.  9.  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  10. Postmenopausal state.  11. Sleep disturbance.  12. History of Cardiolite in 2002.   CURRENT MEDICINES:  1.  CellCept 750 b.i.d.  2.  Prednisone 7.5 a day.  3.  Cyclosporin 100 milligrams b.i.d.  4.  Calcitriol 0.5 mcg 2 a day.  5.  Labetalol 300 mg b.i.d.  6.  Lasix 80 milligrams a day.  7.  Cardura 8 milligrams a day.  8.  Potassium supplements 2 a day.  9.  Remeron 30 milligrams at bedtime.  10. Pepcid 40 milligrams a day.  11. Ambien 5 milligrams at bedtime as needed.  12. Anusol HC suppositories as needed.   FAMILY HISTORY:  Noncontributory.   SOCIAL HISTORY:  The patient is married. 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 Does not use alcohol or tobacco.   REVIEW OF SYSTEMS:  Positive for arm and hand weakness,  difficulty writing  and doing fine motor movement. She also has pain and weakness in her left  leg. She also complains fatigue. She denies chest pain, shortness of breath,  fever, dysuria, edema, allograft tenderness, chills, sweats, nausea,  vomiting, or abdominal pain.   PHYSICAL EXAMINATION:  GENERAL:  She is a very pleasant woman in no  distress.  VITAL SIGNS:  133/77, temperature 97.2, heart rate 87, respiratory rate 18,  weight 168.9 pounds.  LUNGS:  Clear to auscultation.  CARDIOVASCULAR:  Cardiac exam S1-S2, no audible S3.  ABDOMEN:  Abdomen was obese. She had bowel sounds which were present. No  abdominal bruits. Allograft was firm and nontender with no bruit over the  kidney.  EXTREMITIES:  Free of edema and there was a patent fistula in the left  forearm.   Laboratory from August 18, 2005, hemoglobin 10, hematocrit 29.6 (9.7 in  June of 2006) WBC 8400. Sodium 140, potassium 3.5, chloride 105, CO2 27, BUN  47, creatinine 2.8, calcium 10.4. Repeat BUN and creatinine on August 20, 2005 51 and 3.2.   IMPRESSION:  This is a  60 year old black female with renal transplant who  has chronic kidney disease. Based on serial creatinine in our office, there  has been an insidious rise in creatinine since March of 2006. She has never  had a transplant biopsy. There are no recent cyclosporin levels available.  As far as other explanations for her elevated creatinine, I see no evidence  for consumption of recede of nephrotoxins. She has had no major blood  pressure aberrations and she does not look particularly dry.   RECOMMENDATIONS:  1.  If at all possible, I would recommend 1) delay in surgery for 24 hours;      2) hold Lasix today, tomorrow, and one day postoperatively despite the      fact that she does not really look dry; 3) give slow IV fluids normal      saline at 50 cc an hour; 4) transfuse 2 units of packed cells slowly      over 3 hours each for her anemia. She will need Procrit or Aranesp as an      outpatient but we can arrange this at her next office appointment.  2.  Follow up renal function studies in the morning.  3.  If creatinine is the same or better in the a.m., then I would proceed      with surgery with further evaluation of her progressive kidney disease      in the allograft as an outpatient.  4.  Will check a cyclosporin level in the morning since we do not have the      recent one and would like to assure that it is neither too high or too      low.  5.  Recheck renal panel in the morning along with CBC post transfusion.  6.  No change in her other usual home medications except holding her Lasix.      Thanks for asking [place] to see her and we will follow closely with you      during the time of hospitalization and readdress the CK-MB and anemia      issues as an outpatient.          ______________________________  Elzie Rings. Lorrene Reid, M.D.    CBD/MEDQ  D:  08/20/2005  T:  08/20/2005  Job:  [id number]

## 2011-05-01 NOTE — Discharge Summary (Signed)
[place]. [facility]  Patient:    [name], [name]                      MRN: [id number] Adm. Date:  [id number] Disc. Date: [id number] Attending:  [name] Dictator:   [name], P.A. CC:         Dr. Desma Maxim, [facility], [place]   Discharge Summary  ADMISSION DIAGNOSES: 1. Fever, chills, with recent Klebsiella urinary tract infection. 2. Cadaveric renal transplant with increased creatinine due to volume    depletion, possibly transplant pyelonephritis. 3. Hypokalemia and hypophosphatasemia. 4. Hypertension. 5. Recent abdominal hernia repair with clean wounds. 6. History of gout with recent flare up. 7. 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  DISCHARGE DIAGNOSES: 1. Klebsiella pneumonia urosepsis. 2. 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 3. Creatinine back to baseline at 2.0. 4. Hypokalemia and hypophosphatasemia corrected. 5. Hypertension. 6. Status post recent abdominal hernia repair with clean wounds. 7. Status post recent gout flare up.  BRIEF HISTORY:  A 60 year old black female with end-stage renal disease and a functional cadaveric renal transplant in 1996 due to end-stage renal disease from membranous proliferative glomerulonephritis with baseline creatinine of around 2.0.  She presents with a 24 to 48-hour history of fever, hard shaking chills, dysuria, decreased urine output, general malaise, dull headaches.  She had a recent Klebsiella UTI in December 2001 during a hospitalization for low back pain and spinal stenosis.  She presents to the emergency room now with a temperature of 103, hard shaking chills, and is admitted to rule out urosepsis.  LABORATORY DATA ON ADMISSION:  White count 19,500 with 98% polys, hematocrit 30.4,  platelets 210,000.  Sodium 131, chloride 107, CO2 20, glucose 174, BUN 44, creatinine 2.8, phosphorus 1.7, potassium 3.5.   Urinalysis: Cloudy yellow urine with a pH of 5.0, moderate hemoglobin, moderate leukocyte esterase, 21 to 50 white blood cells, and 6 to 10 red blood cells per high power field.  HOSPITAL COURSE:  After being pancultured, the patient was put on IV Cipro and [place].  Her immunosuppressant drugs were continued, but her diuretics and antihypertensives were held.  Her potassium and phosphorus were repleted.  Ultrasound of both native and transplanted kidney were done showing moderate hydronephrosis of the left renal transplant; however, normal kidney size and parenchymal echo texture.  It measured 11.1 cm in greatest sagittal length. The hydronephrosis persisted unchanged after voiding, and there were no peritransplant fluid collections.  The native kidney was small and echogenic with a simple 1 cm cyst in the upper pole of the native right kidney.  That kidney measured 5.77 cm, and the native left kidney measured 5.8 cm in greatest sagittal length.  Her blood and urine cultures both returned growing the same organism with the same sensitivities.  Klebsiella pneumonia grew out of blood and urine cultures, and it was a pansensitive organism.  With antibiotic therapy, her fever that spiked as high as 103.2, defervesced slowly.  By the fourth hospital day, she was afebrile and remained afebrile thereafter.  Her white blood cell count returned to within normal limits after 72 hours of antibiotics.  She clinically improved.  Her antibiotics were switched to Ancef after sensitivities returned.  Her Cardura, labetalol, and Lasix were resumed with normal vital signs and her creatinine returning to baseline of 2.0.  At time of discharge, she was switched to Keflex 500 mg t.i.d. to complete a total three-week course.  Arrangements will be made for her to follow up with Dr.  Desma Maxim at [facility] about getting her transplant kidney evaluated. This is her second UTI in recent months with this episode progressing to sepsis.  Additionally, her anemia was worked up with iron studies showing iron deficiency.  She underwent a course of InFeD.  Her stools were hemoccult-negative x 2.  At time of discharge, her hemoglobin is 9.0.  DISCHARGE MEDICATIONS:  1. Keflex 500 mg t.i.d. for 2 more weeks.  2. Prednisone 7.5 mg daily.  3. CellCept 1000 mg b.i.d.  4. Neoral 150 mg b.i.d.  5. Lasix 80 mg daily.  6. Labetalol 300 mg b.i.d.  7. Cardura 8 mg daily.  8. Folic acid 1 mg daily.  9. Prometrium 200 mg at bedtime. 10. Vivelle Dot Patch 0.037 mg every Tuesday and Friday. DD:  03/26/01 TD:  03/26/01 Job: [id number]

## 2011-05-01 NOTE — H&P (Signed)
[place]. [facility]  Patient:    [name], [name]                      MRN: [id number] Adm. Date:  02/20/01 Attending:  Roney Jaffe, M.D.                         History and Physical  REASON FOR ADMISSION:  Fever and chills.  HISTORY:  Patient is a 60 year old African-American female with end-stage renal disease and a functional renal transplant since 1996.  She has a baseline creatinine around 2.0 and presents with a 24- to 48-hour history of fever, hard shaking chills associated with dysuria and decreased urine output. She had a UTI with ______  in December of 2001 while hospitalized for low back pain and spinal stenosis.  The patient was feeling fine until two days prior to admission when she began to feel a general malaise and yesterday evening, she developed a dull headache and chills.  This morning, she presented to the emergency room with increasing chills and temperature of 103.  She is admitted for further evaluation of possible urosepsis.  PAST MEDICAL HISTORY: 1. 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 2. Hypertension. 3. Gout with recent flare-up three weeks ago, treated with colchicine. 4. Spinal stenosis with chronic low back pain. 5. History of gingival hyperplasia. 6. Status post abdominal hernia repair six weeks ago at [facility]  MEDICATIONS:  This is a partial list, possibly others. 1. CellCept 1 g b.i.d. 2. Prednisone 7.5 mg q.d. 3. Neoral 150 mg b.i.d. 4. Lasix 80 mg q.d. 5. Labetalol 300 mg b.i.d. 6. Cardura 8 mg q.d.  ALLERGIES:  None.  SOCIAL HISTORY:  No alcohol or tobacco use.  REVIEW OF SYSTEMS:  GENERAL:  No recent weight loss.  ENT:  No otalgia, rhinorrhea, sore throat or difficulty swallowing.  No visual or hearing changes.  RESPIRATORY:  No pleuritic chest pain, productive cough or shortness of breath.  CARDIAC:  No substernal chest  pain or history of coronary disease. GI:  Vomited multiple times yesterday and has had diarrhea for 24 hours also. No abdominal pain and no flank pain.  GU:  Positive for diarrhea.  No gross hematuria.  Urine output has decreased over the last 24 hours. MUSCULOSKELETAL:  No acute joint complaints or history of significant arthritis.  There is a history of gout as noted above; the last flare was in her right foot, I believe.  She was going to be started on allopurinol at some point but has been having frequent flares and so this has not been possible. NEUROLOGIC:  No history of stroke, TIA or CVA and no current neurologic complaints.  PHYSICAL EXAMINATION:  VITAL SIGNS:  Temperature 103 degrees Fahrenheit.  Blood pressure 106/70, pulse 80, respirations 18.  GENERAL:  This is an alert and moderately toxic black female in no severe distress.  SKIN:  Without rash.  HEENT:  PERL.  EOMI.  TMs are normal.  Throat is clear without erythema or exudate.  NECK:  Supple without meningismus or lymphadenopathy.  CHEST:  Clear throughout.  CARDIAC:  Regular rate and rhythm without murmur, rub or gallop.  ABDOMEN:  Soft and nontender.  The left lower quadrant transplant is nontender.  There is no CVA tenderness.  The lower midline recent surgical incision is clean without  drainage, induration or erythema.  EXTREMITIES:  No peripheral edema or erythema or ulcers.  NEUROLOGIC:  Alert and oriented x 3 with a grossly nonfocal exam.  LABORATORY AND X-RAY FINDINGS:  WBC count 19,000, hematocrit 30%, platelets 210,000.  Sodium 131, potassium 3.7, BUN 44, creatinine 2.5, phosphorus 4.7, alkaline phosphatase 75.  Liver enzymes within normal limits.  Calcium 9.7, albumin 3.3.  Urinalysis:  Wbcs 20-50, many bacteria, 6-10 rbcs and moderate leukocyte esterase.  Chest x-ray negative.  IMPRESSION: 1. High fever and chills secondary to urosepsis.  History of recent ______    urinary tract infection  in December. 2. Cadaveric renal transplant with creatinine increased at baseline due to    volume depletion, and also possibly transplant pyelonephritis. 3. Hypokalemia and hypophosphatemia. 4. Hypertension -- blood pressure currently low. 5. Recent abdominal hernia repair with clean abdominal wounds. 6. History of gout with recent flare.  PLAN: 1. Admit. 2. Urine and blood cultures. 3. IV Cipro. 4. Replace phosphorus and potassium. 5. IV fluids. 6. Continue outpatient medications except for holding blood pressure    medications. DD:  02/21/00 TD:  02/21/01 Job: [id number]

## 2011-05-01 NOTE — Discharge Summary (Signed)
[name], [name]             ACCOUNT NO.:  [id number]   MEDICAL RECORD NO.:  [id number]          PATIENT TYPE:  INP   LOCATION:  3022                         FACILITY:  [place]   PHYSICIAN:  [name], M.D.  DATE OF BIRTH:  [birth year]   DATE OF ADMISSION:  08/20/2005  DATE OF DISCHARGE:  08/23/2005                                 DISCHARGE SUMMARY   ADMITTING DIAGNOSES:  1.  Cervical spondylosis with myelopathy cervical vertebrae-3/cervical      vertebrae-4.  2.  Pseudoarthrosis cervical vertebrae-5/cervical vertebrae-6.  3.  Chronic renal failure, status post renal transplant 1996.   FINAL DIAGNOSES:  1.  Cervical spondylosis with myelopathy cervical vertebrae-3/cervical      vertebrae-4.  2.  Pseudoarthrosis cervical vertebrae-5/cervical vertebrae-6.  3.  Chronic renal failure, status post renal transplant 1996.   CONSULTATION:  Dr. Erling West, renal service.   HOSPITAL COURSE:  [name] is a 60 year old individual who had  chronic renal failure and had been on hemodialysis in the past. In 1996, she  received her renal transplant. She has a significant history of cervical  lumbar spondylosis and was diagnosed with cervical myelopathy at the C3-C4  level. She has had previous anterior decompression arthrodesis at the C4-5  at C5-6 level. Anterior plate fixation had been carried out at those levels.  The patient had developed significant cord compression at C3-4 and she was  advised regarding surgery. She was admitted to the hospital on August 20, 2005; however, preoperative laboratory studies demonstrated that her  creatinine was 2.6 a couple of days before the surgery and on the day of  surgery it increased to 3.1. Because of this, surgery was postponed. Further  workup revealed that the patient had evolved a significant anemia. She was  transfused 2 units of blood. She was given fluid hydration. After this, her  creatinine remained stable at 3.0.   The surgery was undertaken without difficulty and she tolerated procedure  well. Postoperatively, she had some swelling in her throat and some  difficulty with swallowing; however, at this time she is able to tolerate  oral pain medications for control and she is fully ambulatory. She has been  advised as to her postoperative activities. Dr. Jamal West did the  nephrology consult in the hospital and Dr. Erling West followed up with her  while the patient was in the hospital. She has an appointment to see Dr.  Florene West on the morning of the 11th and she will follow-up with him then. I  will see her in about three weeks' time. Her blood pressure had remained  stable. She had  some slight increases during the postoperative period and Dr. Florene West had  adjusted the labetalol to 300 milligrams twice a day.   CONDITION ON DISCHARGE:  Stable.      [name], M.D.  Electronically Signed     HJE/MEDQ  D:  08/23/2005  T:  08/23/2005  Job:  [id number]   cc:   [name]. [name], M.D.  Fax: [phone]

## 2011-05-01 NOTE — H&P (Signed)
[name], [name]             ACCOUNT NO.:  [id number]   MEDICAL RECORD NO.:  [id number]          PATIENT TYPE:  INP   LOCATION:  3022                         FACILITY:  [place]   PHYSICIAN:  Earleen Newport, M.D.  DATE OF BIRTH:  05-22-1951   DATE OF ADMISSION:  08/20/2005  DATE OF DISCHARGE:                                HISTORY & PHYSICAL   ADMISSION DIAGNOSES:  1.  Cervical spondylosis with myelopathy.  2.  Chronic renal failure.   HISTORY OF PRESENT ILLNESS:  [name] is a 60 year old individual who  has had chronic problems with spondylosis of the cervical spine and lumbar  spine.  She underwent a previous anterior cervical decompression and  arthrodesis of C4-5 and  C5-6.  She has had chronic problems with back pain  and planned a lumbar decompression on her on several occasions; however, she  seems to continue to tolerate the pain.  Several weeks ago she came to the  office complaining of increasing numbness in her hands.  Work-up at that  time included a repeat MRI which demonstrated presence of high grade  compression of the spinal cord at the C3-C4 level above a previous  arthrodesis.  She was advised regarding surgical decompression and was  admitted for this procedure today; however, in her preoperative laboratory  studies it was noted that her creatinine had increased slightly from 2.1 to  2.6 and on presentation for surgery today, repeat creatinine was elevated at  3.2.  She is now being admitted to undergo further work-up and evaluation of  her renal status per the renal service.  Her surgery will be postponed or  delayed until it is clear what is actually occurring.   PAST MEDICAL HISTORY:  Reveals that she has had chronic renal failure and  had been on hemodialysis.  She received a renal transplant in 1996.  The  patient had some hyperlipidemia.  She has multiple other medical problems.   MEDICATION LIST:  1.  CellCept 250 mg twice daily.  2.   Potassium chloride 750 mg twice daily.  3.  Zocor 10 mg a day.  4.  Labetalol HCl 300 mg twice daily.  5.  Remeron 30 mg by mouth at bedtime.  6.  Quinine sulfate 260 mg at bedtime.  7.  Allopurinol 300 mg half tablet daily.  8.  Flexeril 10 mg three times daily.  9.  Rocaltrol 0.5 mcg two daily.  10. Anucort HC suppositories as needed.  11. Pepcid 40 mg daily.  12. Prednisone 5 mg daily.  13. Vicodin one or two every eight hours as needed for pain.  14. Lasix 80 mg daily.  15. Neoral 100 mg twice daily.  16. Doxazosin mesylate 8 mg daily.  17. Ambien 5 mg at bedtime.   PHYSICAL EXAMINATION:  She is alert and oriented and cooperative.  She has  marked weakness of grips in her hands. She has difficulty with any fine  motor coordination or fine motor movements in her upper extremities.  She  has coarse grips that are greater than 3/5.  Biceps strength is 4/5.  Triceps strength is 4/5.  Intrinsic strength is 3/5.  Sensation appears  diminished in both upper extremities and both lower extremities.  Her gait  is markedly ataxic with a wide base and generalized weakness of 4/5.  Cranial nerve examination reveals her pupils are 4 mm, briskly reactive to  light and accommodation.  Extraocular movements are full.  Face symmetric to  grimace.  Tongue and uvula area midline.  Sclerae, conjunctivae are clear.   IMPRESSION:  The patient has evidence of spondylosis with myelopathy in the  cervical spine. She also has evidence of some chronic renal failure with  worsening numbers in the recent past.  She is to be undergoing further work-  up from the renal service.  Surgery will be delayed at this time for the  next 24 hours and hopefully we will be able to decompress and stabilize her  cervical spine in the very near future.      Earleen Newport, M.D.  Electronically Signed     HJE/MEDQ  D:  08/20/2005  T:  08/21/2005  Job:  [id number]

## 2011-05-01 NOTE — Op Note (Signed)
[place]. [facility]  Patient:    [name], [name] Visit Number: [id number] MRN: [id number]          Service Type: SUR Location: [id number] Attending Physician:  Clearnce Sorrel Dictated by:   Earleen Newport, M.D. Proc. Date: 03/07/02 Admit Date:  03/07/2002                             Operative Report  PREOPERATIVE DIAGNOSIS:  Cervical spondylosis and radiculopathy, C4-C5 and C5-C6.  POSTOPERATIVE DIAGNOSIS:  Cervical spondylosis and radiculopathy, C4-C5 and C5-C6.  PROCEDURE:  Anterior cervical diskectomy, C4-C5 and C5-C6, structural allograft arthrodesis, fixation with Synthes plate.  SURGEON:  Earleen Newport, M.D.  FIRST ASSISTANT:  Elizabeth Sauer, M.D.  ANESTHESIA:  General endotracheal.  INDICATIONS:  The patient is a 60 year old individual who has had significant neck, shoulder, and bilateral arm pain, seemingly worse on the right than on the left.  She was noted to have severe spondylitic disease at C4-C5 and also at C5-C6, and she was advised regarding significant surgical intervention via an anterior approach.  Prior to her surgery, she noticed a worsening of symptoms, and a repeat CT scan suggested a prominent bulge at the C4-C5 level. The surgery was suggested at two levels.  DESCRIPTION OF PROCEDURE:  The patient was brought to the operating room and placed on the table in the supine position.  After a smooth induction of general endotracheal anesthesia, she was placed in five pounds of halter traction.  The neck was shaved, prepped with DuraPrep, and draped in a sterile fashion.  A transverse incision was made in the left side of the neck and carried down through the platysma.  The plane between the sternocleidomastoid and the strap muscles was dissected bluntly until the prevertebral space was reached.  First identifiable disk space was noted that of C5-C6.  Dissection was then carried cephalad to expose  C4-C5.  The  longus colli muscle was stripped on either side.  Diskectomy at C5-C6 was then undertaken removing a large ventral osteophyte.  The disk space was evacuated of a marked quantity of degenerated disk material.  The lateral recesses were identified, and there was noted to be softening in the bone and also the uncinate spurs were noted to be rather large.  A 2.3-mm dissecting tool was used on a high-speed air drill to resect the lateral recesses, and bony fragments were removed from this region.  The undersurface of the body of C5 was cleared, and this relieved the central compressive component.  Hemostasis in the soft tissues was applied and achieved with bipolar cautery and some Gelfoam soaked Thrombin pledgets which were later irrigated away.  A 7-mm tricortical bone graft was placed with the cortical surface facing sideways.  Attention was then turned to C4-C5 where a similar procedure was carried out.  Here, however, on each lateral side on the left and on the right, a significant free fragment of disk material was found in the lateral recesses.  This was resected.  The endplates were cleared adequately and again, a tricortical piece of bone graft was placed in the interspace with the tricortical surface facing sideways. Traction was removed.  The neck was placed in slight flexion.  A 34-mm standard size Synthes plate was then contoured in the appropriate fashion, and six locking 4 x 14-mm screws were placed to lock this construct into position. In the end, the soft  tissues were checked for hemostasis.  The wound was irrigated copiously with antibiotic irrigating solution.  The platysma was closed with 3-0 Vicryl in interrupted fashion and 3-0 Vicryl was used to close the subcuticular skin.  The patient tolerated the procedure well and was returned to the recovery room in stable condition. Dictated by:   Earleen Newport, M.D. Attending Physician:  Clearnce Sorrel DD:  03/07/02 TD:   03/08/02 Job: [id number]

## 2011-05-01 NOTE — Consult Note (Signed)
[place]. [facility]  Patient:    [name], [name]                      MRN: [id number] Proc. Date: 09/24/00 Adm. Date:  [id number] Attending:  Roney Jaffe D                          Consultation Report  REASON FOR CONSULTATION:  Severe low back and left lower extremity pain.  HISTORY OF PRESENT ILLNESS:  [name] is a 60 year old woman, status post cadaveric renal transplant in April 1996, with renal failure with sever left lower extremity and low back pain which she developed following a lifting injury while pushing stones at work which occurred on September 21, 2000.  She developed increasingly severe pain as of September 22, 2000.  Went to Urgent Care.  Came to the emergency room at [facility] on September 24, 2000, with severe pain.  She was unable to walk without severe disabling pain.  She was diagnosed with an urinary tract infection yesterday and was started on Septra.  CT scan of the lumbosacral spine was performed today as she was unable to have an MRI performed secondary to embolization coils in her ______.  The CT scan was reviewed by me, and this demonstrated severe spinal stenosis at L4-5 with facet arthropathy and left greater than right lateral recess stenosis.  She has increased pain with ambulation, and denies any bowel or bladder dysfunction.  She has a left upper extremity fistula, and a baseline creatinine of 2.3, which currently her creatinine is 2.5.  PAST MEDICAL HISTORY: 1. Renal transplant. 2. Urinary tract infection. 3. Gout, for which she is on colchicine.  PHYSICAL EXAMINATION:  GENERAL:  The patient is on bed rest.  She is alert, pleasant, cooperative, seizure clear, fluent speech.  Intact short and long-term memory.  NEUROLOGIC EXAMINATION:  Her pupils are equal, round and reactive to light. Extraocular movements intact.  Bilateral arcus senilis noted.  Facial motor and sensation are intact and symmetric.   Hearing is intact to finger rub. Palate is upgoing.  Shoulder shrug is symmetric.  Tongue protrudes in the midline.  She has 5/5 upper and lower extremity strength and all motor groups are symmetric.  I was unable to assess her extensor hallices longus strength secondary to gout in both great toes which causes her pain to move her great toes.  She appears to have no numbness to light touch in both lower extremities.  Straight leg raise is positive on the left greater than the right at 60 degrees.  Reflexes are 2 to 3 in the bilateral upper extremities, bilaterally symmetric, 3 at the knees, absent at the ankles.  Great toes are downgoing to plantar stimulation.  On examination of her lumbar spine there is no palpable deformity.  She has minimal low back pain in the midline, but does have bilateral sciatic notches left greater than right, and some tenderness over her greater trochanter on the right as well.  IMPRESSION:  [name] is a 60 year old woman with severe spinal stenosis and left greater than right lateral recess stenosis, most marked at the L4-5 level.  This is quite severe, and she may well require operative intervention which would consist of decompressive lumbar laminectomy. However, I would recommend that the patient first undergo trial of bed rest, analgesics, and a trial of physical therapy in the hospital.  She needs to continue  treatment for the urinary tract infection, as I would not recommend any surgery while she has an ongoing infection.  If she has no improvement over the next few days, then I would recommend a decompressive laminectomy be performed for her severe stenosis. DD:  09/24/00 TD:  09/25/00 Job: [id number]

## 2011-05-01 NOTE — Op Note (Signed)
[name], [name]             ACCOUNT NO.:  [id number]   MEDICAL RECORD NO.:  [id number]          PATIENT TYPE:  INP   LOCATION:  3022                         FACILITY:  [facility]   PHYSICIAN:  Earleen Newport, M.D.  DATE OF BIRTH:  03-03-1951   DATE OF PROCEDURE:  08/21/2005  DATE OF DISCHARGE:                                 OPERATIVE REPORT   PREOPERATIVE DIAGNOSIS:  Cervical spondylosis and herniated nucleus  pulposus, C3-C4, with myelopathy.   POSTOPERATIVE DIAGNOSIS:  Cervical spondylosis and herniated nucleus  pulposus, C3-C4, with myelopathy.   PROCEDURE:  Anterior cervical decompression, C3-C4, arthrodesis with  structural allograft and Alphatec plate fixation, removal of anterior  cervical plate, C4 to C6, with exploration of arthrodesis, C4-5 and C5-6.   SURGEON:  Kristeen Miss, MD   FIRST ASSISTANT:  Glenna Fellows, M.D.   ANESTHESIA:  General endotracheal.   INDICATIONS:  [name] is a 60 year old individual who has had  significant spondylosis, both in the cervical and the lumbar spines.  She  has developed a myelopathy involving both upper extremities and has a large  extruded fragment of disk in addition of spondylitic changes at the C3-C4  level.  She has previously undergone arthrodesis from C4 to C6 and the MRI  suggest there may be a pseudoarthrosis at the C5-C6 level.  She is now being  taken to the operating room to undergo surgical decompression at C3-4.  The  plate will be removed and the neck and be explored also to make sure that  there was solid arthrodesis at 4-5 and 5-6.   PROCEDURE:  The patient was brought to the operating room supine on the  stretcher.  After the smooth induction of general tracheal anesthesia, she  was placed in 5 pounds of halter traction and the neck was draped sterilely  and prepped with DuraPrep.  A transverse incision was made in the line of  the previous incision and this was taken down through the fascia.  The  plane  between the sternocleidomastoid and strap muscles was dissected bluntly  until the prevertebral space was reached.  The first identifiable item was  the old plate and by sequentially locating each of the 6 screws, they were  loosened and then removed.  With some toggling, the plate was removed.  The  screw holes were then checked hemostatically by placing a small pledgets of  Gelfoam soaked in thrombin.  The interspace between C4-5 and C5-6 was then  explored and at C5-6, there was noted to be one area of weakening; however,  when exploring this area and placing screws into the previously placed screw  holes and then distracting up on the screws without the presence of the  plate, no distraction could be achieved, thus it was felt that the  interspace at C5-6 was solidly arthrodesed.  The dissection was then checked  further cephalad at C4-5 and this was also noted be solid.  At C3-4 then,  ventral osteophytosis had occurred; the disk space, however, was then opened  by removing the ventral osteophytes and opening with a 15 blade, and  a  considerable quantity of severely degenerated disk material was removed from  C3-4.  The dissection was carried posteriorly to the region of the posterior  longitudinal ligament.  With distraction, there was noted to self-extrude  some disk material from beyond the area of the ligament.  This was then  opened further and the ligament was identified and opened also.  The  distraction itself allowed for a good decompression of the common dural  tube.  Osteophytes from the inferior margin body of C3 were taken off with a  2-mm Kerrison punch.  Dissection was carried out laterally to the either  side.  The bony endplates were then smoothed and decorticated of any  cartilaginous material.  An 8-mm transgraft was then shaped and sized to its  final configuration and filled with demineralized bone matrix.  This was  then countersunk into the interspace.   Traction was removed.  Hemostasis in  the soft tissues was checked.  A 16-mm standard-size Alphatec plate was then  fitted to the ventral aspect of the vertebral bodies with 4 locking 4 x 14-  mm screws.  Fixed-angle screws were used in C4, variable-angle screws were  used in C3.  Final confirming radiograph identified good position of the  allograft and good position of the plate.  Hemostasis was then checked  meticulously in the area of the graft.  One defects on the right side of the  interspace at C5-C6 was drilled out with a 2-mm drill and then this was  filled similarly with demineralized bone matrix.  Again, with hemostasis  being achieved, the platysma was ultimately closed with 3-0 Vicryl in  interrupted fashion and 3-0 Vicryl was used to close the subcuticular  tissues.  Dermabond was placed on the skin.  The patient tolerated procedure  well.      Earleen Newport, M.D.  Electronically Signed     HJE/MEDQ  D:  08/21/2005  T:  08/22/2005  Job:  [id number]

## 2011-05-01 NOTE — Consult Note (Signed)
[name], FEHL NO.:  [id number]   MEDICAL RECORD NO.:  [id number]          PATIENT TYPE:  INP   LOCATION:  [id number]                         FACILITY:  [facility]   PHYSICIAN:  Asencion Noble, M.D. LHCDATE OF BIRTH:  1951-05-24   DATE OF CONSULTATION:  09/29/2004  DATE OF DISCHARGE:                                   CONSULTATION   REFERRING PHYSICIAN:  Darrold Span. Florene Glen, M.D.   CHIEF COMPLAINT:  Fever with right upper lobe infiltrate.   HISTORY OF PRESENT ILLNESS:  A 60 year old, African-American female who has  a history of renal transplantation since 1996, history of hypertension,  hyperlipidemia.  She has had a 4 day history of neck pain, headache,  abdominal discomfort, nausea, loose stools, fever to 101, dry cough,  increasing right-sided pleuritic chest pain with shortness of breath and  fever up to 101.  She was admitted on September 26, 2004, initially with a  negative chest x-ray.  She had leukocytosis up to 21,000.  Previously, she  was being set up for lumbar surgery electively, but was cancelled because of  the elevated white count and then referred over to renal for admission.  Workup has shown development and evolution of right upper lobe infiltrate on  the film from today when the initial film from September 26, 2004, showed no  active disease process.  Abdominal CT scan shows no acute findings in the  abdomen or pelvis.  We are asked to assess the patient for this pneumonia.   PAST MEDICAL HISTORY:  1.  Renal transplantation.  2.  Hypertension.  3.  Hyperlipidemia.  4.  No previous history of MI.  5.  History of lumbar disease.   MEDICATIONS PRIOR TO ADMISSION:  1.  Zocor 10 mg daily.  2.  CellCept 750 mg b.i.d.  3.  Neoral 100 mg b.i.d.  4.  Potassium 20 mEq daily.  5.  Lipitor 40 mg daily.  6.  Labetalol 300 mg b.i.d.  7.  Cardura 8 mg daily.  8.  Furosemide 80 mg daily.  9.  Allopurinol 150 mg daily.  10. Prednisone 5 mg daily.  11.  Calcitrol two daily.  12. Nexium 40 mg daily.   Family history, social history and review of systems otherwise  noncontributory.   PHYSICAL EXAMINATION:  VITAL SIGNS:  T-max of 102.6 on September 28, 2004, now  down to 99.9, blood pressure 146/70, pulse 87, respirations 18, saturations  91% on 2 L.  GENERAL:  This is an elderly, African-American female in no acute distress.  CHEST:  Rales and rhonchi in the right upper lobe, consolidated changes in  right upper lobe.  Otherwise, clear on the left.  NEUROLOGIC:  Intact.  SKIN:  Clear.  CARDIAC:  Resting tachycardia without S3.  Normal S1, S2, no murmur or rub.  EXTREMITIES:  No clubbing, edema or venous disease seen.   LABORATORY DATA AND X-RAY FINDINGS:  Sodium 137, potassium 4.0, chloride  111, CO2 18, BUN 24, creatinine 1.8, calcium 9.0.  White count 18,900,  hemoglobin 9.  Urine culture negative.  Blood cultures are pending, but  no  growth to date.  C. difficile toxin is pending.   Chest x-ray on initial was clear on the right upper lobe.  Now, on the film  from today, it shows progressive right upper lobe consolidation and  infiltrate with COPD changes.   IMPRESSION:  Evolving right upper lobe community acquired pneumonia in a  patient with renal transplantation, relatively immunosuppressed, but doubt  this is significant opportunistic infection since she is responding to  intravenous Avelox.  She does have retained secretions and ongoing  leukocytosis which is improving.   RECOMMENDATIONS:  1.  Agree with IV Avelox.  No need for bronchoscopy as of yet.  2.  Continue nebulizers.  Add flutter valve incentive spirometry.  3.  Follow the patient expectantly.      Patr   PW/MEDQ  D:  09/29/2004  T:  09/30/2004  Job:  [id number]   cc:   Michel Bickers, M.D.  [place]  [place] [address]  Fax: [phone]

## 2011-05-04 ENCOUNTER — Other Ambulatory Visit: Payer: Self-pay | Admitting: Nephrology

## 2011-05-04 ENCOUNTER — Encounter (HOSPITAL_COMMUNITY): Payer: Medicare Other

## 2011-05-04 ENCOUNTER — Encounter: Payer: Self-pay | Admitting: Internal Medicine

## 2011-05-04 LAB — RENAL FUNCTION PANEL
BUN: 87 mg/dL — ABNORMAL HIGH (ref 6–23)
CO2: 19 mEq/L (ref 19–32)
Chloride: 98 mEq/L (ref 96–112)
Creatinine, Ser: 5.36 mg/dL — ABNORMAL HIGH (ref 0.4–1.2)
Potassium: 3.6 mEq/L (ref 3.5–5.1)

## 2011-05-04 LAB — IRON AND TIBC
Saturation Ratios: 45 % (ref 20–55)
TIBC: 186 ug/dL — ABNORMAL LOW (ref 250–470)
UIBC: 102 ug/dL

## 2011-05-05 LAB — PTH, INTACT AND CALCIUM
Calcium, Total (PTH): 9.4 mg/dL (ref 8.4–10.5)
PTH: 272.9 pg/mL — ABNORMAL HIGH (ref 14.0–72.0)

## 2011-05-05 LAB — POCT HEMOGLOBIN-HEMACUE: Hemoglobin: 9.5 g/dL — ABNORMAL LOW (ref 12.0–15.0)

## 2011-05-12 ENCOUNTER — Encounter (HOSPITAL_COMMUNITY): Payer: Medicare Other

## 2011-05-12 ENCOUNTER — Other Ambulatory Visit: Payer: Self-pay | Admitting: Nephrology

## 2011-05-12 LAB — POCT HEMOGLOBIN-HEMACUE: Hemoglobin: 9.3 g/dL — ABNORMAL LOW (ref 12.0–15.0)

## 2011-05-18 ENCOUNTER — Encounter (HOSPITAL_COMMUNITY): Payer: Medicare Other | Attending: Nephrology

## 2011-05-18 ENCOUNTER — Other Ambulatory Visit: Payer: Self-pay | Admitting: Nephrology

## 2011-05-18 DIAGNOSIS — D638 Anemia in other chronic diseases classified elsewhere: Secondary | ICD-10-CM | POA: Insufficient documentation

## 2011-05-18 DIAGNOSIS — N184 Chronic kidney disease, stage 4 (severe): Secondary | ICD-10-CM | POA: Insufficient documentation

## 2011-05-25 ENCOUNTER — Encounter (HOSPITAL_COMMUNITY): Payer: Medicare Other

## 2011-05-25 ENCOUNTER — Other Ambulatory Visit: Payer: Self-pay | Admitting: Nephrology

## 2011-06-01 ENCOUNTER — Other Ambulatory Visit: Payer: Self-pay | Admitting: Nephrology

## 2011-06-01 ENCOUNTER — Encounter (HOSPITAL_COMMUNITY): Payer: Medicare Other

## 2011-06-08 ENCOUNTER — Encounter (HOSPITAL_COMMUNITY): Payer: Medicare Other

## 2011-06-08 ENCOUNTER — Other Ambulatory Visit: Payer: Self-pay | Admitting: Nephrology

## 2011-06-15 ENCOUNTER — Encounter (HOSPITAL_COMMUNITY): Payer: Medicare Other | Attending: Nephrology

## 2011-06-15 ENCOUNTER — Other Ambulatory Visit: Payer: Self-pay | Admitting: Nephrology

## 2011-06-15 DIAGNOSIS — D638 Anemia in other chronic diseases classified elsewhere: Secondary | ICD-10-CM | POA: Insufficient documentation

## 2011-06-15 DIAGNOSIS — N184 Chronic kidney disease, stage 4 (severe): Secondary | ICD-10-CM | POA: Insufficient documentation

## 2011-06-16 LAB — POCT HEMOGLOBIN-HEMACUE: Hemoglobin: 8.1 g/dL — ABNORMAL LOW (ref 12.0–15.0)

## 2011-06-22 ENCOUNTER — Encounter (HOSPITAL_COMMUNITY): Payer: Medicare Other

## 2011-06-22 ENCOUNTER — Other Ambulatory Visit: Payer: Self-pay | Admitting: Nephrology

## 2011-06-29 ENCOUNTER — Encounter (HOSPITAL_COMMUNITY): Payer: Medicare Other

## 2011-06-29 ENCOUNTER — Other Ambulatory Visit: Payer: Self-pay | Admitting: Nephrology

## 2011-07-06 ENCOUNTER — Other Ambulatory Visit: Payer: Self-pay | Admitting: Nephrology

## 2011-07-06 ENCOUNTER — Encounter (HOSPITAL_COMMUNITY): Payer: Medicare Other

## 2011-07-13 ENCOUNTER — Other Ambulatory Visit: Payer: Self-pay | Admitting: Nephrology

## 2011-07-13 ENCOUNTER — Encounter (HOSPITAL_COMMUNITY): Payer: Medicare Other

## 2011-07-13 LAB — POCT HEMOGLOBIN-HEMACUE: Hemoglobin: 7 g/dL — ABNORMAL LOW (ref 12.0–15.0)

## 2011-07-14 ENCOUNTER — Encounter (HOSPITAL_COMMUNITY): Payer: Medicare Other

## 2011-07-14 ENCOUNTER — Ambulatory Visit (AMBULATORY_SURGERY_CENTER): Payer: Medicare Other | Admitting: *Deleted

## 2011-07-14 VITALS — Ht 61.0 in | Wt 128.0 lb

## 2011-07-14 DIAGNOSIS — D126 Benign neoplasm of colon, unspecified: Secondary | ICD-10-CM

## 2011-07-15 LAB — CROSSMATCH
ABO/RH(D): O POS
Antibody Screen: NEGATIVE

## 2011-07-20 ENCOUNTER — Encounter (HOSPITAL_COMMUNITY): Payer: Medicare Other | Attending: Nephrology

## 2011-07-20 ENCOUNTER — Other Ambulatory Visit: Payer: Self-pay | Admitting: Nephrology

## 2011-07-20 DIAGNOSIS — N184 Chronic kidney disease, stage 4 (severe): Secondary | ICD-10-CM | POA: Insufficient documentation

## 2011-07-20 DIAGNOSIS — D638 Anemia in other chronic diseases classified elsewhere: Secondary | ICD-10-CM | POA: Insufficient documentation

## 2011-07-20 LAB — CBC
HCT: 26.5 % — ABNORMAL LOW (ref 36.0–46.0)
Hemoglobin: 8.3 g/dL — ABNORMAL LOW (ref 12.0–15.0)
MCV: 93.6 fL (ref 78.0–100.0)
RBC: 2.83 MIL/uL — ABNORMAL LOW (ref 3.87–5.11)
WBC: 12.3 10*3/uL — ABNORMAL HIGH (ref 4.0–10.5)

## 2011-07-22 ENCOUNTER — Encounter (HOSPITAL_BASED_OUTPATIENT_CLINIC_OR_DEPARTMENT_OTHER): Payer: Medicare Other | Admitting: Internal Medicine

## 2011-07-22 ENCOUNTER — Other Ambulatory Visit: Payer: Self-pay | Admitting: Internal Medicine

## 2011-07-22 ENCOUNTER — Encounter (HOSPITAL_COMMUNITY)
Admission: RE | Admit: 2011-07-22 | Discharge: 2011-07-22 | Disposition: A | Discharge status: Home / Self Care | Payer: Medicare Other | Source: Ambulatory Visit | Attending: Internal Medicine | Admitting: Internal Medicine

## 2011-07-22 DIAGNOSIS — D649 Anemia, unspecified: Secondary | ICD-10-CM

## 2011-07-22 DIAGNOSIS — D539 Nutritional anemia, unspecified: Secondary | ICD-10-CM

## 2011-07-22 LAB — CBC WITH DIFFERENTIAL/PLATELET
EOS%: 0.8 % (ref 0.0–7.0)
LYMPH%: 7.9 % — ABNORMAL LOW (ref 14.0–49.7)
MCH: 28.8 pg (ref 25.1–34.0)
MCHC: 30.1 g/dL — ABNORMAL LOW (ref 31.5–36.0)
MCV: 95.6 fL (ref 79.5–101.0)
MONO%: 2.7 % (ref 0.0–14.0)
Platelets: 219 10*3/uL (ref 145–400)
RBC: 2.71 10*6/uL — ABNORMAL LOW (ref 3.70–5.45)
RDW: 17.3 % — ABNORMAL HIGH (ref 11.2–14.5)
nRBC: 0 % (ref 0–0)

## 2011-07-22 LAB — TECHNOLOGIST REVIEW

## 2011-07-23 ENCOUNTER — Ambulatory Visit (AMBULATORY_SURGERY_CENTER): Payer: Medicare Other | Admitting: Internal Medicine

## 2011-07-23 ENCOUNTER — Encounter: Payer: Self-pay | Admitting: Internal Medicine

## 2011-07-23 VITALS — HR 79 | Temp 99.3°F | Resp 26 | Ht 61.0 in | Wt 121.0 lb

## 2011-07-23 DIAGNOSIS — D129 Benign neoplasm of anus and anal canal: Secondary | ICD-10-CM

## 2011-07-23 DIAGNOSIS — Z86010 Personal history of colonic polyps: Secondary | ICD-10-CM

## 2011-07-23 DIAGNOSIS — D128 Benign neoplasm of rectum: Secondary | ICD-10-CM

## 2011-07-23 DIAGNOSIS — D126 Benign neoplasm of colon, unspecified: Secondary | ICD-10-CM

## 2011-07-23 LAB — TYPE & CROSSMATCH - CHCC

## 2011-07-23 MED ORDER — SODIUM CHLORIDE 0.9 % IV SOLN: 500.0000 mL | INTRAVENOUS | Status: DC

## 2011-07-23 NOTE — Patient Instructions (Signed)
Read the handouts given to you by your recovery room nurse.    Resume your routine medications today.  You will need another procedure in 3 years.  If you have any questions or concerns, call [phone]. Thank-you.

## 2011-07-24 ENCOUNTER — Telehealth: Payer: Self-pay

## 2011-07-24 ENCOUNTER — Encounter (HOSPITAL_BASED_OUTPATIENT_CLINIC_OR_DEPARTMENT_OTHER): Payer: Medicare Other | Admitting: Internal Medicine

## 2011-07-24 DIAGNOSIS — D649 Anemia, unspecified: Secondary | ICD-10-CM

## 2011-07-24 LAB — FOLATE: Folate: >20 ng/mL

## 2011-07-24 LAB — COMPREHENSIVE METABOLIC PANEL
ALT: <8 U/L (ref 0–35)
AST: 10 U/L (ref 0–37)
Alkaline Phosphatase: 59 U/L (ref 39–117)
BUN: 137 mg/dL — ABNORMAL HIGH (ref 6–23)
Calcium: 9.5 mg/dL (ref 8.4–10.5)
Chloride: 102 mEq/L (ref 96–112)
Creatinine, Ser: 6.81 mg/dL — ABNORMAL HIGH (ref 0.50–1.10)
Total Bilirubin: 0.6 mg/dL (ref 0.3–1.2)

## 2011-07-24 LAB — FERRITIN: Ferritin: 1427 ng/mL — ABNORMAL HIGH (ref 10–291)

## 2011-07-24 LAB — RETICULOCYTES (CHCC): ABS Retic: 35.5 10*3/uL (ref 19.0–186.0)

## 2011-07-24 LAB — IRON AND TIBC
%SAT: 10 % — ABNORMAL LOW (ref 20–55)
TIBC: 176 ug/dL — ABNORMAL LOW (ref 250–470)
UIBC: 159 ug/dL

## 2011-07-24 LAB — PROTEIN ELECTROPHORESIS, SERUM
Albumin ELP: 52.6 % — ABNORMAL LOW (ref 55.8–66.1)
Beta 2: 4.6 % (ref 3.2–6.5)
M-Spike, %: 1.09 g/dL
Total Protein, Serum Electrophoresis: 7 g/dL (ref 6.0–8.3)

## 2011-07-24 LAB — ERYTHROPOIETIN: Erythropoietin: 291 m[IU]/mL — ABNORMAL HIGH (ref 2.6–34.0)

## 2011-07-24 NOTE — Telephone Encounter (Signed)

## 2011-07-25 LAB — CROSSMATCH: Unit division: 0

## 2011-07-27 ENCOUNTER — Encounter (HOSPITAL_COMMUNITY): Payer: Medicare Other

## 2011-07-27 ENCOUNTER — Other Ambulatory Visit: Payer: Self-pay | Admitting: Nephrology

## 2011-07-30 ENCOUNTER — Encounter (HOSPITAL_COMMUNITY): Payer: Medicare Other

## 2011-07-30 ENCOUNTER — Encounter (HOSPITAL_BASED_OUTPATIENT_CLINIC_OR_DEPARTMENT_OTHER): Payer: Medicare Other | Admitting: Internal Medicine

## 2011-07-30 ENCOUNTER — Other Ambulatory Visit: Payer: Self-pay | Admitting: Internal Medicine

## 2011-07-30 DIAGNOSIS — D638 Anemia in other chronic diseases classified elsewhere: Secondary | ICD-10-CM

## 2011-07-30 DIAGNOSIS — D649 Anemia, unspecified: Secondary | ICD-10-CM

## 2011-07-30 DIAGNOSIS — D509 Iron deficiency anemia, unspecified: Secondary | ICD-10-CM

## 2011-07-30 LAB — CBC WITH DIFFERENTIAL/PLATELET
BASO%: 0 % (ref 0.0–2.0)
Eosinophils Absolute: 0.1 10*3/uL (ref 0.0–0.5)
HCT: 28.5 % — ABNORMAL LOW (ref 34.8–46.6)
LYMPH%: 5.2 % — ABNORMAL LOW (ref 14.0–49.7)
MCHC: 33.1 g/dL (ref 31.5–36.0)
MCV: 92.4 fL (ref 79.5–101.0)
MONO#: 1 10*3/uL — ABNORMAL HIGH (ref 0.1–0.9)
MONO%: 9.7 % (ref 0.0–14.0)
NEUT%: 84.5 % — ABNORMAL HIGH (ref 38.4–76.8)
Platelets: 247 10*3/uL (ref 145–400)
WBC: 10.1 10*3/uL (ref 3.9–10.3)

## 2011-08-03 ENCOUNTER — Encounter (HOSPITAL_COMMUNITY): Payer: Medicare Other

## 2011-08-03 ENCOUNTER — Other Ambulatory Visit: Payer: Self-pay | Admitting: Nephrology

## 2011-08-03 LAB — RENAL FUNCTION PANEL
CO2: 17 mEq/L — ABNORMAL LOW (ref 19–32)
Calcium: 10.1 mg/dL (ref 8.4–10.5)
Creatinine, Ser: 5.8 mg/dL — ABNORMAL HIGH (ref 0.50–1.10)
Glucose, Bld: 125 mg/dL — ABNORMAL HIGH (ref 70–99)
Phosphorus: 6.4 mg/dL — ABNORMAL HIGH (ref 2.3–4.6)

## 2011-08-03 LAB — IRON AND TIBC
Saturation Ratios: 35 % (ref 20–55)
UIBC: 104 ug/dL

## 2011-08-04 LAB — PTH, INTACT AND CALCIUM: PTH: 208 pg/mL — ABNORMAL HIGH (ref 14.0–72.0)

## 2011-08-04 LAB — POCT HEMOGLOBIN-HEMACUE: Hemoglobin: 9.5 g/dL — ABNORMAL LOW (ref 12.0–15.0)

## 2011-08-10 ENCOUNTER — Other Ambulatory Visit: Payer: Self-pay | Admitting: Nephrology

## 2011-08-10 ENCOUNTER — Encounter (HOSPITAL_COMMUNITY): Payer: Medicare Other

## 2011-08-18 ENCOUNTER — Encounter (HOSPITAL_COMMUNITY): Payer: Medicare Other

## 2011-08-24 ENCOUNTER — Other Ambulatory Visit: Payer: Self-pay | Admitting: Nephrology

## 2011-08-24 ENCOUNTER — Encounter (HOSPITAL_COMMUNITY): Payer: Medicare Other | Attending: Nephrology

## 2011-08-24 DIAGNOSIS — N184 Chronic kidney disease, stage 4 (severe): Secondary | ICD-10-CM | POA: Insufficient documentation

## 2011-08-24 DIAGNOSIS — D638 Anemia in other chronic diseases classified elsewhere: Secondary | ICD-10-CM | POA: Insufficient documentation

## 2011-08-27 ENCOUNTER — Encounter (HOSPITAL_BASED_OUTPATIENT_CLINIC_OR_DEPARTMENT_OTHER): Payer: Medicare Other | Admitting: Internal Medicine

## 2011-08-27 ENCOUNTER — Other Ambulatory Visit: Payer: Self-pay | Admitting: Internal Medicine

## 2011-08-27 DIAGNOSIS — D649 Anemia, unspecified: Secondary | ICD-10-CM

## 2011-08-27 LAB — CBC WITH DIFFERENTIAL/PLATELET
BASO%: 0.3 % (ref 0.0–2.0)
LYMPH%: 22.8 % (ref 14.0–49.7)
MCH: 28.7 pg (ref 25.1–34.0)
MCHC: 31.3 g/dL — ABNORMAL LOW (ref 31.5–36.0)
MCV: 91.7 fL (ref 79.5–101.0)
MONO%: 14.4 % — ABNORMAL HIGH (ref 0.0–14.0)
Platelets: 206 10*3/uL (ref 145–400)
RBC: 2.89 10*6/uL — ABNORMAL LOW (ref 3.70–5.45)

## 2011-08-28 LAB — BETA 2 MICROGLOBULIN, SERUM: Beta-2 Microglobulin: 19.06 mg/L — ABNORMAL HIGH (ref 1.01–1.73)

## 2011-08-28 LAB — KAPPA/LAMBDA LIGHT CHAINS: Kappa free light chain: 19.5 mg/dL — ABNORMAL HIGH (ref 0.33–1.94)

## 2011-08-28 LAB — IGG, IGA, IGM: IgG (Immunoglobin G), Serum: 1600 mg/dL (ref 690–1700)

## 2011-08-31 ENCOUNTER — Encounter (HOSPITAL_COMMUNITY): Payer: Medicare Other

## 2011-08-31 ENCOUNTER — Other Ambulatory Visit: Payer: Self-pay | Admitting: Nephrology

## 2011-08-31 LAB — POCT HEMOGLOBIN-HEMACUE: Hemoglobin: 8.8 g/dL — ABNORMAL LOW (ref 12.0–15.0)

## 2011-09-03 ENCOUNTER — Encounter (HOSPITAL_BASED_OUTPATIENT_CLINIC_OR_DEPARTMENT_OTHER): Payer: Medicare Other | Admitting: Internal Medicine

## 2011-09-03 DIAGNOSIS — R5383 Other fatigue: Secondary | ICD-10-CM

## 2011-09-03 DIAGNOSIS — D649 Anemia, unspecified: Secondary | ICD-10-CM

## 2011-09-03 DIAGNOSIS — R5381 Other malaise: Secondary | ICD-10-CM

## 2011-09-03 DIAGNOSIS — D638 Anemia in other chronic diseases classified elsewhere: Secondary | ICD-10-CM

## 2011-09-03 LAB — IRON AND TIBC: UIBC: 174

## 2011-09-03 LAB — FERRITIN: Ferritin: 180 (ref 10–291)

## 2011-09-03 LAB — COMPREHENSIVE METABOLIC PANEL
ALT: <8
AST: 14
Alkaline Phosphatase: 90
Glucose, Bld: 102 — ABNORMAL HIGH
Potassium: 3.8
Sodium: 141
Total Protein: 8.2

## 2011-09-03 LAB — CBC
HCT: 37
MCV: 92.5
Platelets: 293
RDW: 18.7 — ABNORMAL HIGH

## 2011-09-03 LAB — CYCLOSPORINE LEVEL: Cyclosporine: 176

## 2011-09-04 LAB — POCT HEMOGLOBIN-HEMACUE: Hemoglobin: 12.7

## 2011-09-04 LAB — CYCLOSPORINE LEVEL: Cyclosporine: 305

## 2011-09-04 LAB — RENAL FUNCTION PANEL
Albumin: 3.6
CO2: 26
Calcium: 9.9
GFR calc Af Amer: 17 — ABNORMAL LOW
GFR calc non Af Amer: 14 — ABNORMAL LOW
Phosphorus: 4.2
Sodium: 141

## 2011-09-04 LAB — IRON AND TIBC: Iron: 30 — ABNORMAL LOW

## 2011-09-07 ENCOUNTER — Encounter (HOSPITAL_COMMUNITY): Payer: Medicare Other

## 2011-09-07 ENCOUNTER — Other Ambulatory Visit: Payer: Self-pay | Admitting: Nephrology

## 2011-09-07 LAB — COMPREHENSIVE METABOLIC PANEL
Albumin: 3.2 — ABNORMAL LOW
BUN: 59 — ABNORMAL HIGH
Creatinine, Ser: 3.96 — ABNORMAL HIGH
Glucose, Bld: 133 — ABNORMAL HIGH
Total Bilirubin: 0.9
Total Protein: 7.3

## 2011-09-07 LAB — AMYLASE: Amylase: 151 — ABNORMAL HIGH

## 2011-09-07 LAB — OCCULT BLOOD X 1 CARD TO LAB, STOOL: Fecal Occult Bld: NEGATIVE

## 2011-09-07 LAB — RENAL FUNCTION PANEL
Albumin: 2.4 — ABNORMAL LOW
Albumin: 2.7 — ABNORMAL LOW
BUN: 64 — ABNORMAL HIGH
BUN: 66 — ABNORMAL HIGH
CO2: 19
Calcium: 9.2
Calcium: 9.3
Calcium: 9.4
Chloride: 107
Creatinine, Ser: 4.46 — ABNORMAL HIGH
Creatinine, Ser: 4.5 — ABNORMAL HIGH
GFR calc Af Amer: 12 — ABNORMAL LOW
GFR calc Af Amer: 16 — ABNORMAL LOW
GFR calc non Af Amer: 10 — ABNORMAL LOW
GFR calc non Af Amer: 13 — ABNORMAL LOW
Phosphorus: 3
Potassium: 3.5
Potassium: 3.7
Sodium: 139

## 2011-09-07 LAB — CULTURE, BLOOD (ROUTINE X 2)

## 2011-09-07 LAB — CLOSTRIDIUM DIFFICILE EIA

## 2011-09-07 LAB — CBC
HCT: 33.3 — ABNORMAL LOW
HCT: 35.3 — ABNORMAL LOW
HCT: 38.6
Hemoglobin: 12.6
MCHC: 32.8
MCHC: 32.9
MCV: 86.8
MCV: 87.2
Platelets: 118 — ABNORMAL LOW
Platelets: 123 — ABNORMAL LOW
Platelets: 133 — ABNORMAL LOW
Platelets: 161
RBC: 3.83 — ABNORMAL LOW
RDW: 17.5 — ABNORMAL HIGH
RDW: 18 — ABNORMAL HIGH
RDW: 18.2 — ABNORMAL HIGH
WBC: 22.3 — ABNORMAL HIGH
WBC: 23.7 — ABNORMAL HIGH

## 2011-09-07 LAB — URINALYSIS, MICROSCOPIC ONLY
Bilirubin Urine: NEGATIVE
Nitrite: NEGATIVE
Specific Gravity, Urine: 1.017
Urobilinogen, UA: 0.2
pH: 5

## 2011-09-07 LAB — DIFFERENTIAL
Basophils Relative: 0
Eosinophils Absolute: 0
Lymphocytes Relative: 2 — ABNORMAL LOW
Monocytes Absolute: 1.1 — ABNORMAL HIGH
Neutrophils Relative %: 94 — ABNORMAL HIGH
WBC Morphology: INCREASED

## 2011-09-07 LAB — IRON AND TIBC
Iron: 57
Saturation Ratios: 24
TIBC: 234 — ABNORMAL LOW
UIBC: 177

## 2011-09-07 LAB — WOUND CULTURE

## 2011-09-07 LAB — TROPONIN I: Troponin I: 0.02

## 2011-09-07 LAB — CK TOTAL AND CKMB (NOT AT ARMC)
Relative Index: INVALID
Total CK: 79

## 2011-09-07 LAB — URINE CULTURE

## 2011-09-07 LAB — POCT HEMOGLOBIN-HEMACUE: Hemoglobin: 7.2 g/dL — ABNORMAL LOW (ref 12.0–15.0)

## 2011-09-08 LAB — CBC
HCT: 33.7 — ABNORMAL LOW
Hemoglobin: 10.9 — ABNORMAL LOW
RDW: 17.5 — ABNORMAL HIGH

## 2011-09-08 LAB — IRON AND TIBC
Saturation Ratios: 31
UIBC: 141

## 2011-09-08 LAB — RENAL FUNCTION PANEL
CO2: 21
Calcium: 9.6
GFR calc Af Amer: 15 — ABNORMAL LOW
GFR calc non Af Amer: 13 — ABNORMAL LOW
Phosphorus: 4.3
Sodium: 137

## 2011-09-10 ENCOUNTER — Other Ambulatory Visit: Payer: Self-pay | Admitting: Nephrology

## 2011-09-10 ENCOUNTER — Encounter (HOSPITAL_COMMUNITY): Payer: Medicare Other

## 2011-09-11 ENCOUNTER — Other Ambulatory Visit: Payer: Self-pay | Admitting: Internal Medicine

## 2011-09-11 ENCOUNTER — Ambulatory Visit (HOSPITAL_COMMUNITY): Payer: Medicare Other | Attending: Internal Medicine

## 2011-09-11 DIAGNOSIS — D649 Anemia, unspecified: Secondary | ICD-10-CM | POA: Insufficient documentation

## 2011-09-11 DIAGNOSIS — D472 Monoclonal gammopathy: Secondary | ICD-10-CM

## 2011-09-11 LAB — CROSSMATCH
ABO/RH(D): O POS
Unit division: 0

## 2011-09-11 LAB — DIFFERENTIAL
Basophils Absolute: 0 10*3/uL (ref 0.0–0.1)
Basophils Relative: 0 % (ref 0–1)
Eosinophils Absolute: 0.1 10*3/uL (ref 0.0–0.7)
Neutrophils Relative %: 64 % (ref 43–77)

## 2011-09-11 LAB — CBC
Platelets: 173 10*3/uL (ref 150–400)
RBC: 3.2 MIL/uL — ABNORMAL LOW (ref 3.87–5.11)
WBC: 5.4 10*3/uL (ref 4.0–10.5)

## 2011-09-14 ENCOUNTER — Encounter (HOSPITAL_COMMUNITY)
Admission: RE | Admit: 2011-09-14 | Discharge: 2011-09-14 | Disposition: A | Discharge status: Home / Self Care | Payer: Medicare Other | Source: Ambulatory Visit | Attending: Nephrology | Admitting: Nephrology

## 2011-09-14 ENCOUNTER — Other Ambulatory Visit: Payer: Self-pay | Admitting: Nephrology

## 2011-09-14 DIAGNOSIS — N184 Chronic kidney disease, stage 4 (severe): Secondary | ICD-10-CM | POA: Insufficient documentation

## 2011-09-14 DIAGNOSIS — D638 Anemia in other chronic diseases classified elsewhere: Secondary | ICD-10-CM | POA: Insufficient documentation

## 2011-09-15 LAB — RENAL FUNCTION PANEL
CO2: 22 mEq/L (ref 19–32)
Chloride: 105 mEq/L (ref 96–112)
GFR calc Af Amer: 13 mL/min — ABNORMAL LOW (ref >60)
GFR calc non Af Amer: 11 mL/min — ABNORMAL LOW (ref >60)
Glucose, Bld: 86 mg/dL (ref 70–99)
Potassium: 4.1 mEq/L (ref 3.5–5.1)
Sodium: 138 mEq/L (ref 135–145)

## 2011-09-15 LAB — IRON AND TIBC
Iron: 44 ug/dL (ref 42–135)
Saturation Ratios: 22 % (ref 20–55)
Saturation Ratios: 19 % — ABNORMAL LOW (ref 20–55)
UIBC: 182 ug/dL

## 2011-09-15 LAB — POCT HEMOGLOBIN-HEMACUE
Hemoglobin: 8.3 g/dL — ABNORMAL LOW (ref 12.0–15.0)
Hemoglobin: 8.3 g/dL — ABNORMAL LOW (ref 12.0–15.0)
Hemoglobin: 10.7 g/dL — ABNORMAL LOW (ref 12.0–15.0)

## 2011-09-15 LAB — CYCLOSPORINE: Cyclosporine, LabCorp: 164 ng/mL (ref 100–400)

## 2011-09-15 LAB — FERRITIN: Ferritin: 418 ng/mL — ABNORMAL HIGH (ref 10–291)

## 2011-09-18 LAB — IRON AND TIBC
Iron: 28 ug/dL — ABNORMAL LOW (ref 42–135)
Iron: 27 — ABNORMAL LOW
TIBC: 220 ug/dL — ABNORMAL LOW (ref 250–470)
UIBC: 192 ug/dL
UIBC: 158

## 2011-09-18 LAB — CBC
HCT: 23.7 — ABNORMAL LOW
HCT: 30.1 — ABNORMAL LOW
Hemoglobin: 9.9 — ABNORMAL LOW
MCHC: 33
MCHC: 33.3
MCV: 89.8
MCV: 92.5
Platelets: 312
RBC: 2.63 — ABNORMAL LOW
RBC: 3.26 — ABNORMAL LOW
WBC: 10.7 — ABNORMAL HIGH

## 2011-09-18 LAB — TSH: TSH: 2.169

## 2011-09-18 LAB — FERRITIN
Ferritin: 113 ng/mL (ref 10–291)
Ferritin: 389 — ABNORMAL HIGH (ref 10–291)

## 2011-09-18 LAB — RENAL FUNCTION PANEL
Albumin: 2.5 — ABNORMAL LOW
BUN: 38 — ABNORMAL HIGH
CO2: 23
Chloride: 107
Chloride: 109
Creatinine, Ser: 3.01 — ABNORMAL HIGH
GFR calc Af Amer: 19 — ABNORMAL LOW
GFR calc non Af Amer: 16 — ABNORMAL LOW
Glucose, Bld: 97
Phosphorus: 3.2
Potassium: 4.4
Sodium: 139

## 2011-09-18 LAB — BASIC METABOLIC PANEL
Calcium: 9.1
GFR calc Af Amer: 25 — ABNORMAL LOW
GFR calc non Af Amer: 21 — ABNORMAL LOW
Potassium: 4.7
Sodium: 139

## 2011-09-18 LAB — HEMOGLOBIN AND HEMATOCRIT, BLOOD: HCT: 35.8 % — ABNORMAL LOW (ref 36.0–46.0)

## 2011-09-18 LAB — CLOSTRIDIUM DIFFICILE EIA: C difficile Toxins A+B, EIA: NEGATIVE

## 2011-09-21 ENCOUNTER — Other Ambulatory Visit: Payer: Self-pay | Admitting: Nephrology

## 2011-09-21 ENCOUNTER — Encounter (HOSPITAL_COMMUNITY): Payer: Medicare Other

## 2011-09-21 LAB — RENAL FUNCTION PANEL
Albumin: 2.4 — ABNORMAL LOW
Albumin: 2.7 — ABNORMAL LOW
Albumin: 3 — ABNORMAL LOW
BUN: 33 — ABNORMAL HIGH
BUN: 50 — ABNORMAL HIGH
CO2: 20
CO2: 21
Calcium: 8.9
Calcium: 9
Calcium: 9.1
Chloride: 104
Chloride: 107
Chloride: 110
Creatinine, Ser: 1.9 — ABNORMAL HIGH
Creatinine, Ser: 1.9 — ABNORMAL HIGH
Creatinine, Ser: 3.67 — ABNORMAL HIGH
GFR calc Af Amer: 15 — ABNORMAL LOW
GFR calc Af Amer: 15 — ABNORMAL LOW
GFR calc Af Amer: 33 — ABNORMAL LOW
GFR calc Af Amer: 33 — ABNORMAL LOW
GFR calc non Af Amer: 12 — ABNORMAL LOW
GFR calc non Af Amer: 13 — ABNORMAL LOW
GFR calc non Af Amer: 27 — ABNORMAL LOW
GFR calc non Af Amer: 27 — ABNORMAL LOW
Glucose, Bld: 103 — ABNORMAL HIGH
Phosphorus: 2.6
Phosphorus: 3.2
Potassium: 3.6
Potassium: 3.6
Sodium: 136

## 2011-09-21 LAB — CULTURE, BLOOD (ROUTINE X 2): Culture: NO GROWTH

## 2011-09-21 LAB — CBC
HCT: 22.9 — ABNORMAL LOW
HCT: 24.1 — ABNORMAL LOW
HCT: 24.8 — ABNORMAL LOW
HCT: 26 — ABNORMAL LOW
Hemoglobin: 7.7 — CL
Hemoglobin: 7.9 — CL
Hemoglobin: 8.6 — ABNORMAL LOW
MCHC: 31.9
MCHC: 32.3
MCHC: 32.7
MCHC: 33.2
MCHC: 33.2
MCHC: 33.5
MCV: 90.6
MCV: 91.9
MCV: 95.8
Platelets: 260
Platelets: 287
Platelets: 294
RBC: 2.7 — ABNORMAL LOW
RBC: 2.87 — ABNORMAL LOW
RDW: 16.5 — ABNORMAL HIGH
RDW: 16.9 — ABNORMAL HIGH
RDW: 17.1 — ABNORMAL HIGH
RDW: 17.4 — ABNORMAL HIGH
RDW: 17.5 — ABNORMAL HIGH

## 2011-09-21 LAB — STOOL CULTURE

## 2011-09-21 LAB — CK TOTAL AND CKMB (NOT AT ARMC)
CK, MB: 6.2 — ABNORMAL HIGH
Relative Index: 2.8 — ABNORMAL HIGH

## 2011-09-21 LAB — BASIC METABOLIC PANEL
CO2: 19
Chloride: 112
Glucose, Bld: 91
Potassium: 3.7
Sodium: 138

## 2011-09-21 LAB — DIFFERENTIAL
Basophils Absolute: 0
Lymphocytes Relative: 16
Lymphs Abs: 1.2
Monocytes Absolute: 0.9
Neutro Abs: 5.2

## 2011-09-21 LAB — LEGIONELLA PROFILE(CULTURE+DFA/SMEAR): Legionella Antigen (DFA): NEGATIVE

## 2011-09-21 LAB — GIARDIA/CRYPTOSPORIDIUM SCREEN(EIA)
Cryptosporidium Screen (EIA): NEGATIVE
Giardia Screen - EIA: NEGATIVE

## 2011-09-21 LAB — URINALYSIS, ROUTINE W REFLEX MICROSCOPIC
Nitrite: NEGATIVE
Specific Gravity, Urine: 1.02
pH: 5

## 2011-09-21 LAB — LEGIONELLA ANTIGEN, URINE

## 2011-09-21 LAB — CULTURE, RESPIRATORY W GRAM STAIN: Culture: NORMAL

## 2011-09-21 LAB — EXPECTORATED SPUTUM ASSESSMENT W GRAM STAIN, RFLX TO RESP C

## 2011-09-21 LAB — URINE MICROSCOPIC-ADD ON

## 2011-09-21 LAB — URINE CULTURE: Colony Count: 4000

## 2011-09-21 LAB — CLOSTRIDIUM DIFFICILE EIA

## 2011-09-21 LAB — CARDIAC PANEL(CRET KIN+CKTOT+MB+TROPI)
Total CK: 200 — ABNORMAL HIGH
Troponin I: 0.03

## 2011-09-21 LAB — IRON AND TIBC: Saturation Ratios: 21

## 2011-09-24 ENCOUNTER — Other Ambulatory Visit: Payer: Self-pay | Admitting: Internal Medicine

## 2011-09-24 ENCOUNTER — Encounter (HOSPITAL_BASED_OUTPATIENT_CLINIC_OR_DEPARTMENT_OTHER): Payer: Medicare Other | Admitting: Internal Medicine

## 2011-09-24 DIAGNOSIS — D649 Anemia, unspecified: Secondary | ICD-10-CM

## 2011-09-24 DIAGNOSIS — D509 Iron deficiency anemia, unspecified: Secondary | ICD-10-CM

## 2011-09-24 DIAGNOSIS — D638 Anemia in other chronic diseases classified elsewhere: Secondary | ICD-10-CM

## 2011-09-24 LAB — CBC WITH DIFFERENTIAL/PLATELET
BASO%: 0 % (ref 0.0–2.0)
EOS%: 1.2 % (ref 0.0–7.0)
HCT: 27.3 % — ABNORMAL LOW (ref 34.8–46.6)
LYMPH%: 13.2 % — ABNORMAL LOW (ref 14.0–49.7)
MCH: 30.2 pg (ref 25.1–34.0)
MCHC: 32 g/dL (ref 31.5–36.0)
MONO%: 7.4 % (ref 0.0–14.0)
NEUT%: 78.2 % — ABNORMAL HIGH (ref 38.4–76.8)
Platelets: 209 10*3/uL (ref 145–400)

## 2011-09-28 ENCOUNTER — Encounter (HOSPITAL_COMMUNITY): Payer: Medicare Other

## 2011-09-28 ENCOUNTER — Other Ambulatory Visit: Payer: Self-pay | Admitting: Nephrology

## 2011-10-05 ENCOUNTER — Other Ambulatory Visit: Payer: Self-pay | Admitting: Nephrology

## 2011-10-05 ENCOUNTER — Encounter (HOSPITAL_COMMUNITY): Payer: Medicare Other

## 2011-10-12 ENCOUNTER — Encounter (HOSPITAL_COMMUNITY): Payer: Medicare Other

## 2011-10-19 ENCOUNTER — Encounter (HOSPITAL_COMMUNITY): Payer: Medicare Other

## 2011-10-22 ENCOUNTER — Other Ambulatory Visit (HOSPITAL_COMMUNITY): Payer: Self-pay | Admitting: Nephrology

## 2011-10-22 DIAGNOSIS — N186 End stage renal disease: Secondary | ICD-10-CM

## 2011-10-23 ENCOUNTER — Ambulatory Visit (HOSPITAL_COMMUNITY): Payer: Medicare Other

## 2011-10-23 ENCOUNTER — Other Ambulatory Visit (HOSPITAL_COMMUNITY): Payer: Self-pay | Admitting: Nephrology

## 2011-10-23 ENCOUNTER — Other Ambulatory Visit (HOSPITAL_COMMUNITY): Payer: Medicare Other

## 2011-10-23 ENCOUNTER — Ambulatory Visit (HOSPITAL_COMMUNITY)
Admission: RE | Admit: 2011-10-23 | Discharge: 2011-10-23 | Disposition: A | Discharge status: Home / Self Care | Payer: Medicare Other | Source: Ambulatory Visit | Attending: Nephrology | Admitting: Nephrology

## 2011-10-23 DIAGNOSIS — Y849 Medical procedure, unspecified as the cause of abnormal reaction of the patient, or of later complication, without mention of misadventure at the time of the procedure: Secondary | ICD-10-CM | POA: Insufficient documentation

## 2011-10-23 DIAGNOSIS — N186 End stage renal disease: Secondary | ICD-10-CM | POA: Insufficient documentation

## 2011-10-23 DIAGNOSIS — T82898A Other specified complication of vascular prosthetic devices, implants and grafts, initial encounter: Secondary | ICD-10-CM | POA: Insufficient documentation

## 2011-10-23 MED ORDER — IOHEXOL 300 MG/ML  SOLN
100.0000 mL | Freq: Once | INTRAMUSCULAR | Status: AC | PRN
  Administered 2011-10-23: 30 mL via INTRAVENOUS

## 2011-10-23 NOTE — ED Notes (Signed)
K Allred paged. Strong thrill felt and bruit heard over L arm fistula. Left lower arm red swollen painful. This developed overnight.

## 2011-10-23 NOTE — Procedures (Signed)
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  The fistula is patent without a focal stenosis.  See Radiology report.

## 2011-10-30 ENCOUNTER — Ambulatory Visit (HOSPITAL_COMMUNITY): Payer: Medicare Other

## 2011-12-13 ENCOUNTER — Ambulatory Visit (INDEPENDENT_AMBULATORY_CARE_PROVIDER_SITE_OTHER): Payer: Medicare Other

## 2011-12-13 DIAGNOSIS — J90 Pleural effusion, not elsewhere classified: Secondary | ICD-10-CM

## 2011-12-13 DIAGNOSIS — N189 Chronic kidney disease, unspecified: Secondary | ICD-10-CM

## 2011-12-13 DIAGNOSIS — R0602 Shortness of breath: Secondary | ICD-10-CM

## 2011-12-17 DIAGNOSIS — D631 Anemia in chronic kidney disease: Secondary | ICD-10-CM | POA: Diagnosis not present

## 2011-12-17 DIAGNOSIS — Z992 Dependence on renal dialysis: Secondary | ICD-10-CM | POA: Diagnosis not present

## 2011-12-17 DIAGNOSIS — N186 End stage renal disease: Secondary | ICD-10-CM | POA: Diagnosis not present

## 2011-12-17 DIAGNOSIS — D509 Iron deficiency anemia, unspecified: Secondary | ICD-10-CM | POA: Diagnosis not present

## 2011-12-17 DIAGNOSIS — N2581 Secondary hyperparathyroidism of renal origin: Secondary | ICD-10-CM | POA: Diagnosis not present

## 2011-12-24 DIAGNOSIS — N186 End stage renal disease: Secondary | ICD-10-CM | POA: Diagnosis not present

## 2011-12-24 DIAGNOSIS — E1129 Type 2 diabetes mellitus with other diabetic kidney complication: Secondary | ICD-10-CM | POA: Diagnosis not present

## 2011-12-30 ENCOUNTER — Encounter: Payer: Self-pay | Admitting: Cardiology

## 2011-12-31 ENCOUNTER — Ambulatory Visit (INDEPENDENT_AMBULATORY_CARE_PROVIDER_SITE_OTHER): Payer: Medicare Other | Admitting: Cardiology

## 2011-12-31 ENCOUNTER — Encounter: Payer: Self-pay | Admitting: Cardiology

## 2011-12-31 DIAGNOSIS — R079 Chest pain, unspecified: Secondary | ICD-10-CM | POA: Diagnosis not present

## 2011-12-31 DIAGNOSIS — R0989 Other specified symptoms and signs involving the circulatory and respiratory systems: Secondary | ICD-10-CM | POA: Diagnosis not present

## 2011-12-31 DIAGNOSIS — R918 Other nonspecific abnormal finding of lung field: Secondary | ICD-10-CM | POA: Diagnosis not present

## 2011-12-31 DIAGNOSIS — R0609 Other forms of dyspnea: Secondary | ICD-10-CM | POA: Diagnosis not present

## 2011-12-31 DIAGNOSIS — R0602 Shortness of breath: Secondary | ICD-10-CM

## 2011-12-31 DIAGNOSIS — R9389 Abnormal findings on diagnostic imaging of other specified body structures: Secondary | ICD-10-CM | POA: Insufficient documentation

## 2011-12-31 DIAGNOSIS — R0600 Dyspnea, unspecified: Secondary | ICD-10-CM | POA: Insufficient documentation

## 2011-12-31 NOTE — Assessment & Plan Note (Signed)
I will valuate her abnormal chest x-ray with suggestion of cardiomegaly by starting with an echocardiogram. She may well have some LVH given her EKG. Further evaluation and treatment will be based on this result.

## 2011-12-31 NOTE — Patient Instructions (Signed)
The current medical regimen is effective;  continue present plan and medications.  Your physician has requested that you have an echocardiogram. Echocardiography is a painless test that uses sound waves to create images of your heart. It provides your doctor with information about the size and shape of your heart and how well your heart's chambers and valves are working. This procedure takes approximately one hour. There are no restrictions for this procedure.  Your physician has requested that you have a lexiscan myoview. For further information please visit [email or web address]. Please follow instruction sheet, as given.  Follow up will be based on the results

## 2011-12-31 NOTE — Progress Notes (Signed)
HPI The patient presents for evaluation of an abnormal chest x-ray. She was referred by urgent care. I don't have a copy of the chest x-ray but I do see a description in their note he reports cardiomegaly with pleural effusion. She's being managed for persistent sinusitis. She does not have a prior cardiac history. However, she does report some chest discomfort. She describes episodes of "indigestion". She has more recently noticed some shortness of breath with activities such as climbing up a flight of stairs. She'll be short of breath when she gets to the top. She might have some chest pressure. This goes away after a few minutes when she rests. She hasn't noticed any jaw or arm discomfort. She doesn't describe PND or orthopnea. She will occasionally get a sensation like her heart is beating fast or skipping at night and she has to sit up abruptly as it might cause slight dyspnea. She doesn't however describe any presyncope or syncope. She does have end-stage renal disease and is currently on dialysis. She had a transplant that lasted for 18 years and she resumed dialysis late last year. She says she tolerates this though she will occasionally get some low blood pressures.  No Known Allergies  Current Outpatient Prescriptions  Medication Sig Dispense Refill  . allopurinol (ZYLOPRIM) 300 MG tablet Take 300 mg by mouth daily. 1/2 daily       . b complex-vitamin c-folic acid (NEPHRO-VITE) 0.8 MG TABS Take 0.8 mg by mouth 1 day or 1 dose.        . cycloSPORINE modified (NEORAL/GENGRAF) 100 MG capsule Take 100 mg by mouth 2 (two) times daily.        Marland Kitchen doxazosin (CARDURA) 8 MG tablet Take 8 mg by mouth 2 (two) times daily.        . famotidine (PEPCID) 20 MG tablet Take 20 mg by mouth daily.        Marland Kitchen HYDROcodone-acetaminophen (VICODIN) 5-500 MG per tablet Take 1 tablet by mouth every 6 (six) hours as needed. Back pain       . labetalol (NORMODYNE) 300 MG tablet Take 600 mg by mouth 2 (two) times daily.         . mycophenolate (CELLCEPT) 250 MG capsule Take 750 mg by mouth 2 (two) times daily.        . pravastatin (PRAVACHOL) 20 MG tablet Take 20 mg by mouth daily.        . predniSONE (DELTASONE) 5 MG tablet Take 5 mg by mouth daily.        . promethazine (PHENERGAN) 25 MG tablet Take 25 mg by mouth every 6 (six) hours as needed. nausea       . zolpidem (AMBIEN) 5 MG tablet Take 5 mg by mouth at bedtime as needed.        Past Medical History  Diagnosis Date  . Anemia   . Anxiety   . Blood transfusion   . Depression   . GERD (gastroesophageal reflux disease)   . Hyperlipidemia   . Hypertension   . Chronic kidney disease     transplant 1996  . Neuromuscular disorder     neuropathy hand and legs  . Weight loss, unintentional   . Adenomatous polyp of colon 10/2010  . Diverticula, colon     Past Surgical History  Procedure Date  . Kidney transplant 1996  . Upper gastrointestinal endoscopy   . Back surgery   . Cervical fusion   . Av fistula placement 1990's  for dialysis  . Colonoscopy     Family History  Problem Relation Age of Onset  . Colon cancer Brother   . Heart disease Mother     History   Social History  . Marital Status: Widowed    Spouse Name: N/A    Number of Children: N/A  . Years of Education: N/A   Occupational History  . Not on file.   Social History Main Topics  . Smoking status: Former Smoker    Types: Cigarettes    Quit date: 12/31/1991  . Smokeless tobacco: Never Used  . Alcohol Use: No  . Drug Use: No  . Sexually Active: Not on file   Other Topics Concern  . Not on file   Social History Narrative  . No narrative on file    ROS:  As stated in the HPI and negative for all other systems.  PHYSICAL EXAM BP 129/87  Pulse 76  Ht 5\' 2"  (1.575 m)  Wt 116 lb (52.617 kg)  BMI 21.22 kg/m2 GENERAL:  Well appearing HEENT:  Pupils equal round and reactive, fundi not visualized, oral mucosa unremarkable NECK:  No jugular venous  distention, waveform within normal limits, carotid upstroke brisk and symmetric, no bruits, no thyromegaly LYMPHATICS:  No cervical, inguinal adenopathy LUNGS:  Clear to auscultation bilaterally BACK:  No CVA tenderness CHEST:  Unremarkable HEART:  PMI not displaced or sustained,S1 and S2 within normal limits, no S3, no S4, no clicks, no rubs, slight systolic murmur only at the apex ABD:  Flat, positive bowel sounds normal in frequency in pitch, no bruits, no rebound, no guarding, no midline pulsatile mass, no hepatomegaly, no splenomegaly EXT:  2 plus pulses throughout, no edema, no cyanosis no clubbing, left forearm dialysis graft and fistula SKIN:  No rashes no nodules NEURO:  Cranial nerves II through XII grossly intact, motor grossly intact throughout PSYCH:  Cognitively intact, oriented to person place and time  EKG:  Sinus rhythm, rate 76, LVH with repolarization changes. 12/31/2011   ASSESSMENT AND PLAN

## 2011-12-31 NOTE — Assessment & Plan Note (Signed)
To further evaluate the patient's dyspnea and discomfort as described she will have stress testing. She's not condition will be a little walk on the treadmill but we will start with this with perfusion imaging. If she cannot do this she can be converted to pharmacologic perfusion imaging.

## 2012-01-06 ENCOUNTER — Ambulatory Visit (HOSPITAL_COMMUNITY): Payer: Medicare Other | Attending: Cardiology | Admitting: Radiology

## 2012-01-06 DIAGNOSIS — N289 Disorder of kidney and ureter, unspecified: Secondary | ICD-10-CM | POA: Insufficient documentation

## 2012-01-06 DIAGNOSIS — R079 Chest pain, unspecified: Secondary | ICD-10-CM | POA: Insufficient documentation

## 2012-01-06 DIAGNOSIS — R0602 Shortness of breath: Secondary | ICD-10-CM | POA: Diagnosis not present

## 2012-01-06 DIAGNOSIS — R002 Palpitations: Secondary | ICD-10-CM | POA: Insufficient documentation

## 2012-01-10 ENCOUNTER — Telehealth: Payer: Self-pay | Admitting: Physician Assistant

## 2012-01-10 NOTE — Telephone Encounter (Signed)
Patient called this afternoon re: labetalol. She was told to hold this medicine the night before her stress test which is tomorrow. However, she forgot to take her dose earlier today and was wondering if it's too late to make it up. Her stress test isn't until 8:45am tomorrow. She is afraid of going without her labetalol. Given bid dosing, I advised her to take her make-up AM dose now (we are still >12 hours out from nuc) and not to take any more until after the stress test. She expressed understanding and gratitude.  Dayna Dunn PA-C

## 2012-01-11 ENCOUNTER — Ambulatory Visit (HOSPITAL_COMMUNITY): Payer: Medicare Other | Attending: Cardiology | Admitting: Radiology

## 2012-01-11 DIAGNOSIS — Z8249 Family history of ischemic heart disease and other diseases of the circulatory system: Secondary | ICD-10-CM | POA: Diagnosis not present

## 2012-01-11 DIAGNOSIS — I10 Essential (primary) hypertension: Secondary | ICD-10-CM | POA: Insufficient documentation

## 2012-01-11 DIAGNOSIS — Z87891 Personal history of nicotine dependence: Secondary | ICD-10-CM | POA: Diagnosis not present

## 2012-01-11 DIAGNOSIS — R000 Tachycardia, unspecified: Secondary | ICD-10-CM | POA: Insufficient documentation

## 2012-01-11 DIAGNOSIS — R002 Palpitations: Secondary | ICD-10-CM | POA: Diagnosis not present

## 2012-01-11 DIAGNOSIS — R5381 Other malaise: Secondary | ICD-10-CM | POA: Diagnosis not present

## 2012-01-11 DIAGNOSIS — E785 Hyperlipidemia, unspecified: Secondary | ICD-10-CM | POA: Insufficient documentation

## 2012-01-11 DIAGNOSIS — I517 Cardiomegaly: Secondary | ICD-10-CM | POA: Insufficient documentation

## 2012-01-11 DIAGNOSIS — R5383 Other fatigue: Secondary | ICD-10-CM | POA: Insufficient documentation

## 2012-01-11 DIAGNOSIS — R0602 Shortness of breath: Secondary | ICD-10-CM

## 2012-01-11 DIAGNOSIS — R0600 Dyspnea, unspecified: Secondary | ICD-10-CM

## 2012-01-11 DIAGNOSIS — R0609 Other forms of dyspnea: Secondary | ICD-10-CM | POA: Diagnosis not present

## 2012-01-11 DIAGNOSIS — R079 Chest pain, unspecified: Secondary | ICD-10-CM | POA: Diagnosis not present

## 2012-01-11 DIAGNOSIS — R0989 Other specified symptoms and signs involving the circulatory and respiratory systems: Secondary | ICD-10-CM | POA: Insufficient documentation

## 2012-01-11 DIAGNOSIS — R42 Dizziness and giddiness: Secondary | ICD-10-CM | POA: Insufficient documentation

## 2012-01-11 MED ORDER — TECHNETIUM TC 99M TETROFOSMIN IV KIT
30.0000 | PACK | Freq: Once | INTRAVENOUS | Status: AC | PRN
  Administered 2012-01-11: 30 via INTRAVENOUS

## 2012-01-11 MED ORDER — TECHNETIUM TC 99M TETROFOSMIN IV KIT
10.0000 | PACK | Freq: Once | INTRAVENOUS | Status: AC | PRN
  Administered 2012-01-11: 10 via INTRAVENOUS

## 2012-01-11 MED ORDER — REGADENOSON 0.4 MG/5ML IV SOLN
0.4000 mg | Freq: Once | INTRAVENOUS | Status: AC
  Administered 2012-01-11: 0.4 mg via INTRAVENOUS

## 2012-01-11 NOTE — Progress Notes (Signed)
[place] [address] [phone]  Cardiology Nuclear Med Study  [name] is a 61 y.o. female [id number] 1951/06/24   Nuclear Med Background Indication for Stress Test:  Evaluation for Ischemia and Abnormal Chest X-ray (Cardiomegaly) History:  No previous documented CAD and 6 yrs ago :GXT okay per patient  Cardiac Risk Factors: Family History - CAD, History of Smoking, Hypertension and Lipids  Symptoms: Chest Pain and Pressure  with/without exertion (last occurrence yesterday),  Diaphoresis, Dizziness, DOE, Fatigue, Light-Headedness, Palpitations, Rapid HR and SOB   Nuclear Pre-Procedure Caffeine/Decaff Intake:  None NPO After: 7:00pm   Lungs:  Clear IV 0.9% NS with Angio Cath:  22g  IV Site: R Forearm  IV Started by:  Eliezer Lofts, EMT-P  Chest Size (in):  34 Cup Size: B  Height: 5\' 2"  (1.575 m)  Weight:  116 lb (52.617 kg)  BMI:  Body mass index is 21.22 kg/(m^2). Tech Comments:  Labetolol held > 16 hours, per patient.    Nuclear Med Study 1 or 2 day study: 1 day  Stress Test Type:  Lexiscan  Reading MD: Dola Argyle, MD  Order Authorizing Provider:  Minus Breeding, MD  Resting Radionuclide: Technetium 11m Tetrofosmin  Resting Radionuclide Dose: 11.0 mCi   Stress Radionuclide:  Technetium 11m Tetrofosmin  Stress Radionuclide Dose: 33.0 mCi           Stress Protocol Rest HR: 69 Stress HR: 87  Rest BP: 161/88 Stress BP: 150/77  Exercise Time (min): 4:56 METS: 6.3   Predicted Max HR: 159 bpm % Max HR: 60.38 bpm Rate Pressure Product: 14400   Dose of Adenosine (mg):  n/a Dose of Lexiscan: 0.4 mg  Dose of Atropine (mg): n/a Dose of Dobutamine: n/a mcg/kg/min (at max HR)  Stress Test Technologist: Irven Baltimore, RN  Nuclear Technologist:  Charlton Amor, CNMT     Rest Procedure:  Myocardial perfusion imaging was performed at rest 45 minutes following the intravenous administration of Technetium  42m Tetrofosmin. Rest ECG: NSR with poor R wave progression, minimal early replorization lateral, and small Q waves inferiorly  Stress Procedure: The patient attempted to walk the treadmill utilizing the Bruce Protocol for 4 minutes and 56 seconds, but was unable to reach target heart rate due to DOE, fatigue, and lightheadedness(RPE=15).The patient jumped to the sides of treadmill.The patient assisted to the chair, and the patient received IV Lexiscan 0.4 mg over 15-seconds.  Technetium 66m Tetrofosmin injected at 30-seconds.  There were no significant changes with Lexiscan. There were occasional PAC's while walking the treadmill.The patient had chest pain after lexiscan. Quantitative spect images were obtained after a 45 minute delay. Stress ECG: No significant change from baseline ECG  QPS Raw Data Images:  Patient motion noted; appropriate software correction applied. Stress Images:  Normal homogeneous uptake in all areas of the myocardium. Rest Images:  Normal homogeneous uptake in all areas of the myocardium. Subtraction (SDS):  No evidence of ischemia. Transient Ischemic Dilatation (Normal <1.22):  1.10 Lung/Heart Ratio (Normal <0.45):  0.33  Quantitative Gated Spect Images QGS EDV:  76 ml QGS ESV:  18 ml QGS cine images:  Normal Wall Motion QGS EF: 76%  Impression Exercise Capacity:  Lexiscan with no exercise. BP Response:  Normal blood pressure response. Clinical Symptoms:  SOB ECG Impression:  No significant ST segment change suggestive of ischemia. Comparison with Prior Nuclear Study: No previous nuclear study performed  Overall Impression:  Normal  stress nuclear study.  Dola Argyle, MD

## 2012-01-12 DIAGNOSIS — E878 Other disorders of electrolyte and fluid balance, not elsewhere classified: Secondary | ICD-10-CM | POA: Diagnosis not present

## 2012-01-13 DIAGNOSIS — N186 End stage renal disease: Secondary | ICD-10-CM | POA: Diagnosis not present

## 2012-01-13 DIAGNOSIS — N189 Chronic kidney disease, unspecified: Secondary | ICD-10-CM | POA: Diagnosis not present

## 2012-01-13 DIAGNOSIS — Z7682 Awaiting organ transplant status: Secondary | ICD-10-CM | POA: Diagnosis not present

## 2012-01-13 DIAGNOSIS — Z01818 Encounter for other preprocedural examination: Secondary | ICD-10-CM | POA: Diagnosis not present

## 2012-01-14 DIAGNOSIS — N186 End stage renal disease: Secondary | ICD-10-CM | POA: Diagnosis not present

## 2012-01-16 DIAGNOSIS — N186 End stage renal disease: Secondary | ICD-10-CM | POA: Diagnosis not present

## 2012-01-16 DIAGNOSIS — N2581 Secondary hyperparathyroidism of renal origin: Secondary | ICD-10-CM | POA: Diagnosis not present

## 2012-01-18 DIAGNOSIS — H43399 Other vitreous opacities, unspecified eye: Secondary | ICD-10-CM | POA: Diagnosis not present

## 2012-01-19 DIAGNOSIS — N2581 Secondary hyperparathyroidism of renal origin: Secondary | ICD-10-CM | POA: Diagnosis not present

## 2012-01-19 DIAGNOSIS — N186 End stage renal disease: Secondary | ICD-10-CM | POA: Diagnosis not present

## 2012-01-21 DIAGNOSIS — N2581 Secondary hyperparathyroidism of renal origin: Secondary | ICD-10-CM | POA: Diagnosis not present

## 2012-01-21 DIAGNOSIS — N186 End stage renal disease: Secondary | ICD-10-CM | POA: Diagnosis not present

## 2012-01-23 DIAGNOSIS — N186 End stage renal disease: Secondary | ICD-10-CM | POA: Diagnosis not present

## 2012-01-23 DIAGNOSIS — N2581 Secondary hyperparathyroidism of renal origin: Secondary | ICD-10-CM | POA: Diagnosis not present

## 2012-01-26 DIAGNOSIS — N186 End stage renal disease: Secondary | ICD-10-CM | POA: Diagnosis not present

## 2012-01-26 DIAGNOSIS — N2581 Secondary hyperparathyroidism of renal origin: Secondary | ICD-10-CM | POA: Diagnosis not present

## 2012-01-27 DIAGNOSIS — Z01811 Encounter for preprocedural respiratory examination: Secondary | ICD-10-CM | POA: Diagnosis not present

## 2012-01-27 DIAGNOSIS — Z01818 Encounter for other preprocedural examination: Secondary | ICD-10-CM | POA: Diagnosis not present

## 2012-01-28 DIAGNOSIS — N186 End stage renal disease: Secondary | ICD-10-CM | POA: Diagnosis not present

## 2012-01-28 DIAGNOSIS — N2581 Secondary hyperparathyroidism of renal origin: Secondary | ICD-10-CM | POA: Diagnosis not present

## 2012-01-30 DIAGNOSIS — N2581 Secondary hyperparathyroidism of renal origin: Secondary | ICD-10-CM | POA: Diagnosis not present

## 2012-01-30 DIAGNOSIS — N186 End stage renal disease: Secondary | ICD-10-CM | POA: Diagnosis not present

## 2012-02-02 ENCOUNTER — Telehealth: Payer: Self-pay | Admitting: Cardiology

## 2012-02-02 DIAGNOSIS — N2581 Secondary hyperparathyroidism of renal origin: Secondary | ICD-10-CM | POA: Diagnosis not present

## 2012-02-02 DIAGNOSIS — N186 End stage renal disease: Secondary | ICD-10-CM | POA: Diagnosis not present

## 2012-02-02 NOTE — Telephone Encounter (Signed)
Echo,Stress faxed to [facility] @ [phone]  02/02/12/KM

## 2012-02-03 DIAGNOSIS — H43399 Other vitreous opacities, unspecified eye: Secondary | ICD-10-CM | POA: Diagnosis not present

## 2012-02-03 DIAGNOSIS — H2510 Age-related nuclear cataract, unspecified eye: Secondary | ICD-10-CM | POA: Diagnosis not present

## 2012-02-04 DIAGNOSIS — N186 End stage renal disease: Secondary | ICD-10-CM | POA: Diagnosis not present

## 2012-02-04 DIAGNOSIS — N2581 Secondary hyperparathyroidism of renal origin: Secondary | ICD-10-CM | POA: Diagnosis not present

## 2012-02-06 DIAGNOSIS — N2581 Secondary hyperparathyroidism of renal origin: Secondary | ICD-10-CM | POA: Diagnosis not present

## 2012-02-06 DIAGNOSIS — N186 End stage renal disease: Secondary | ICD-10-CM | POA: Diagnosis not present

## 2012-02-09 DIAGNOSIS — N186 End stage renal disease: Secondary | ICD-10-CM | POA: Diagnosis not present

## 2012-02-09 DIAGNOSIS — N2581 Secondary hyperparathyroidism of renal origin: Secondary | ICD-10-CM | POA: Diagnosis not present

## 2012-02-11 DIAGNOSIS — D509 Iron deficiency anemia, unspecified: Secondary | ICD-10-CM | POA: Diagnosis not present

## 2012-02-11 DIAGNOSIS — N2581 Secondary hyperparathyroidism of renal origin: Secondary | ICD-10-CM | POA: Diagnosis not present

## 2012-02-11 DIAGNOSIS — Z992 Dependence on renal dialysis: Secondary | ICD-10-CM | POA: Diagnosis not present

## 2012-02-11 DIAGNOSIS — N186 End stage renal disease: Secondary | ICD-10-CM | POA: Diagnosis not present

## 2012-02-12 ENCOUNTER — Telehealth: Payer: Self-pay | Admitting: Cardiology

## 2012-02-12 NOTE — Telephone Encounter (Signed)
Spoke with pt who states she is having trouble after dialysis with dizziness.  She reports her kidney doctor stopped her labetalol but she is to continue her Cardura.  She is having to stay after dialysis for about 1 hour in order to "get her BP back up to normal" and this is when the chest pain occurs.  Labetalol was stopped yesterday.  Dizziness mainly occurs when changing positions and sometimes when turning her head.  Pt is going to monitor her BP thru Tuesday after dialysis and call us back to let us know how she is doing since stopping the Labetalol.  She will call back prior to then if symptoms worsen.  Will forward to MD for his knowledge.

## 2012-02-12 NOTE — Telephone Encounter (Signed)
Please return call to patient on cell# [phone]  Patient has been having elevated BP and chest pains during dialysis.  She would like to come in to see Dr. Percival Spanish ASAP, please return call on cell#

## 2012-02-13 DIAGNOSIS — D509 Iron deficiency anemia, unspecified: Secondary | ICD-10-CM | POA: Diagnosis not present

## 2012-02-13 DIAGNOSIS — Z992 Dependence on renal dialysis: Secondary | ICD-10-CM | POA: Diagnosis not present

## 2012-02-13 DIAGNOSIS — N039 Chronic nephritic syndrome with unspecified morphologic changes: Secondary | ICD-10-CM | POA: Diagnosis not present

## 2012-02-13 DIAGNOSIS — D631 Anemia in chronic kidney disease: Secondary | ICD-10-CM | POA: Diagnosis not present

## 2012-02-13 DIAGNOSIS — M79609 Pain in unspecified limb: Secondary | ICD-10-CM | POA: Diagnosis not present

## 2012-02-13 DIAGNOSIS — N186 End stage renal disease: Secondary | ICD-10-CM | POA: Diagnosis not present

## 2012-02-13 DIAGNOSIS — N2581 Secondary hyperparathyroidism of renal origin: Secondary | ICD-10-CM | POA: Diagnosis not present

## 2012-02-16 DIAGNOSIS — N039 Chronic nephritic syndrome with unspecified morphologic changes: Secondary | ICD-10-CM | POA: Diagnosis not present

## 2012-02-16 DIAGNOSIS — M79609 Pain in unspecified limb: Secondary | ICD-10-CM | POA: Diagnosis not present

## 2012-02-16 DIAGNOSIS — Z992 Dependence on renal dialysis: Secondary | ICD-10-CM | POA: Diagnosis not present

## 2012-02-16 DIAGNOSIS — N2581 Secondary hyperparathyroidism of renal origin: Secondary | ICD-10-CM | POA: Diagnosis not present

## 2012-02-16 DIAGNOSIS — D631 Anemia in chronic kidney disease: Secondary | ICD-10-CM | POA: Diagnosis not present

## 2012-02-16 DIAGNOSIS — D509 Iron deficiency anemia, unspecified: Secondary | ICD-10-CM | POA: Diagnosis not present

## 2012-02-16 DIAGNOSIS — N186 End stage renal disease: Secondary | ICD-10-CM | POA: Diagnosis not present

## 2012-02-18 DIAGNOSIS — N039 Chronic nephritic syndrome with unspecified morphologic changes: Secondary | ICD-10-CM | POA: Diagnosis not present

## 2012-02-18 DIAGNOSIS — Z992 Dependence on renal dialysis: Secondary | ICD-10-CM | POA: Diagnosis not present

## 2012-02-18 DIAGNOSIS — M79609 Pain in unspecified limb: Secondary | ICD-10-CM | POA: Diagnosis not present

## 2012-02-18 DIAGNOSIS — D509 Iron deficiency anemia, unspecified: Secondary | ICD-10-CM | POA: Diagnosis not present

## 2012-02-18 DIAGNOSIS — N186 End stage renal disease: Secondary | ICD-10-CM | POA: Diagnosis not present

## 2012-02-18 DIAGNOSIS — N2581 Secondary hyperparathyroidism of renal origin: Secondary | ICD-10-CM | POA: Diagnosis not present

## 2012-02-18 DIAGNOSIS — D631 Anemia in chronic kidney disease: Secondary | ICD-10-CM | POA: Diagnosis not present

## 2012-02-20 DIAGNOSIS — N186 End stage renal disease: Secondary | ICD-10-CM | POA: Diagnosis not present

## 2012-02-20 DIAGNOSIS — D509 Iron deficiency anemia, unspecified: Secondary | ICD-10-CM | POA: Diagnosis not present

## 2012-02-20 DIAGNOSIS — N039 Chronic nephritic syndrome with unspecified morphologic changes: Secondary | ICD-10-CM | POA: Diagnosis not present

## 2012-02-20 DIAGNOSIS — N2581 Secondary hyperparathyroidism of renal origin: Secondary | ICD-10-CM | POA: Diagnosis not present

## 2012-02-20 DIAGNOSIS — Z992 Dependence on renal dialysis: Secondary | ICD-10-CM | POA: Diagnosis not present

## 2012-02-20 DIAGNOSIS — D631 Anemia in chronic kidney disease: Secondary | ICD-10-CM | POA: Diagnosis not present

## 2012-02-20 DIAGNOSIS — M79609 Pain in unspecified limb: Secondary | ICD-10-CM | POA: Diagnosis not present

## 2012-02-23 DIAGNOSIS — M79609 Pain in unspecified limb: Secondary | ICD-10-CM | POA: Diagnosis not present

## 2012-02-23 DIAGNOSIS — N2581 Secondary hyperparathyroidism of renal origin: Secondary | ICD-10-CM | POA: Diagnosis not present

## 2012-02-23 DIAGNOSIS — D509 Iron deficiency anemia, unspecified: Secondary | ICD-10-CM | POA: Diagnosis not present

## 2012-02-23 DIAGNOSIS — Z992 Dependence on renal dialysis: Secondary | ICD-10-CM | POA: Diagnosis not present

## 2012-02-23 DIAGNOSIS — D631 Anemia in chronic kidney disease: Secondary | ICD-10-CM | POA: Diagnosis not present

## 2012-02-23 DIAGNOSIS — N186 End stage renal disease: Secondary | ICD-10-CM | POA: Diagnosis not present

## 2012-02-25 DIAGNOSIS — Z992 Dependence on renal dialysis: Secondary | ICD-10-CM | POA: Diagnosis not present

## 2012-02-25 DIAGNOSIS — N186 End stage renal disease: Secondary | ICD-10-CM | POA: Diagnosis not present

## 2012-02-25 DIAGNOSIS — M79609 Pain in unspecified limb: Secondary | ICD-10-CM | POA: Diagnosis not present

## 2012-02-25 DIAGNOSIS — D509 Iron deficiency anemia, unspecified: Secondary | ICD-10-CM | POA: Diagnosis not present

## 2012-02-25 DIAGNOSIS — N2581 Secondary hyperparathyroidism of renal origin: Secondary | ICD-10-CM | POA: Diagnosis not present

## 2012-02-25 DIAGNOSIS — D631 Anemia in chronic kidney disease: Secondary | ICD-10-CM | POA: Diagnosis not present

## 2012-02-27 DIAGNOSIS — N186 End stage renal disease: Secondary | ICD-10-CM | POA: Diagnosis not present

## 2012-02-27 DIAGNOSIS — N2581 Secondary hyperparathyroidism of renal origin: Secondary | ICD-10-CM | POA: Diagnosis not present

## 2012-02-27 DIAGNOSIS — N039 Chronic nephritic syndrome with unspecified morphologic changes: Secondary | ICD-10-CM | POA: Diagnosis not present

## 2012-02-27 DIAGNOSIS — D509 Iron deficiency anemia, unspecified: Secondary | ICD-10-CM | POA: Diagnosis not present

## 2012-02-27 DIAGNOSIS — Z992 Dependence on renal dialysis: Secondary | ICD-10-CM | POA: Diagnosis not present

## 2012-02-27 DIAGNOSIS — D631 Anemia in chronic kidney disease: Secondary | ICD-10-CM | POA: Diagnosis not present

## 2012-02-27 DIAGNOSIS — M79609 Pain in unspecified limb: Secondary | ICD-10-CM | POA: Diagnosis not present

## 2012-03-01 DIAGNOSIS — D631 Anemia in chronic kidney disease: Secondary | ICD-10-CM | POA: Diagnosis not present

## 2012-03-01 DIAGNOSIS — N2581 Secondary hyperparathyroidism of renal origin: Secondary | ICD-10-CM | POA: Diagnosis not present

## 2012-03-01 DIAGNOSIS — D509 Iron deficiency anemia, unspecified: Secondary | ICD-10-CM | POA: Diagnosis not present

## 2012-03-01 DIAGNOSIS — M79609 Pain in unspecified limb: Secondary | ICD-10-CM | POA: Diagnosis not present

## 2012-03-01 DIAGNOSIS — N039 Chronic nephritic syndrome with unspecified morphologic changes: Secondary | ICD-10-CM | POA: Diagnosis not present

## 2012-03-01 DIAGNOSIS — N186 End stage renal disease: Secondary | ICD-10-CM | POA: Diagnosis not present

## 2012-03-01 DIAGNOSIS — Z992 Dependence on renal dialysis: Secondary | ICD-10-CM | POA: Diagnosis not present

## 2012-03-02 DIAGNOSIS — Z124 Encounter for screening for malignant neoplasm of cervix: Secondary | ICD-10-CM | POA: Diagnosis not present

## 2012-03-02 DIAGNOSIS — Z01419 Encounter for gynecological examination (general) (routine) without abnormal findings: Secondary | ICD-10-CM | POA: Diagnosis not present

## 2012-03-02 DIAGNOSIS — Z1212 Encounter for screening for malignant neoplasm of rectum: Secondary | ICD-10-CM | POA: Diagnosis not present

## 2012-03-03 DIAGNOSIS — D509 Iron deficiency anemia, unspecified: Secondary | ICD-10-CM | POA: Diagnosis not present

## 2012-03-03 DIAGNOSIS — M79609 Pain in unspecified limb: Secondary | ICD-10-CM | POA: Diagnosis not present

## 2012-03-03 DIAGNOSIS — D631 Anemia in chronic kidney disease: Secondary | ICD-10-CM | POA: Diagnosis not present

## 2012-03-03 DIAGNOSIS — Z992 Dependence on renal dialysis: Secondary | ICD-10-CM | POA: Diagnosis not present

## 2012-03-03 DIAGNOSIS — N2581 Secondary hyperparathyroidism of renal origin: Secondary | ICD-10-CM | POA: Diagnosis not present

## 2012-03-03 DIAGNOSIS — N186 End stage renal disease: Secondary | ICD-10-CM | POA: Diagnosis not present

## 2012-03-03 DIAGNOSIS — N039 Chronic nephritic syndrome with unspecified morphologic changes: Secondary | ICD-10-CM | POA: Diagnosis not present

## 2012-03-05 DIAGNOSIS — D509 Iron deficiency anemia, unspecified: Secondary | ICD-10-CM | POA: Diagnosis not present

## 2012-03-05 DIAGNOSIS — M79609 Pain in unspecified limb: Secondary | ICD-10-CM | POA: Diagnosis not present

## 2012-03-05 DIAGNOSIS — N2581 Secondary hyperparathyroidism of renal origin: Secondary | ICD-10-CM | POA: Diagnosis not present

## 2012-03-05 DIAGNOSIS — N039 Chronic nephritic syndrome with unspecified morphologic changes: Secondary | ICD-10-CM | POA: Diagnosis not present

## 2012-03-05 DIAGNOSIS — D631 Anemia in chronic kidney disease: Secondary | ICD-10-CM | POA: Diagnosis not present

## 2012-03-05 DIAGNOSIS — Z992 Dependence on renal dialysis: Secondary | ICD-10-CM | POA: Diagnosis not present

## 2012-03-05 DIAGNOSIS — N186 End stage renal disease: Secondary | ICD-10-CM | POA: Diagnosis not present

## 2012-03-07 DIAGNOSIS — M810 Age-related osteoporosis without current pathological fracture: Secondary | ICD-10-CM | POA: Diagnosis not present

## 2012-03-07 DIAGNOSIS — Z13820 Encounter for screening for osteoporosis: Secondary | ICD-10-CM | POA: Diagnosis not present

## 2012-03-08 DIAGNOSIS — Z992 Dependence on renal dialysis: Secondary | ICD-10-CM | POA: Diagnosis not present

## 2012-03-08 DIAGNOSIS — D631 Anemia in chronic kidney disease: Secondary | ICD-10-CM | POA: Diagnosis not present

## 2012-03-08 DIAGNOSIS — N186 End stage renal disease: Secondary | ICD-10-CM | POA: Diagnosis not present

## 2012-03-08 DIAGNOSIS — D509 Iron deficiency anemia, unspecified: Secondary | ICD-10-CM | POA: Diagnosis not present

## 2012-03-08 DIAGNOSIS — N039 Chronic nephritic syndrome with unspecified morphologic changes: Secondary | ICD-10-CM | POA: Diagnosis not present

## 2012-03-08 DIAGNOSIS — M79609 Pain in unspecified limb: Secondary | ICD-10-CM | POA: Diagnosis not present

## 2012-03-08 DIAGNOSIS — N2581 Secondary hyperparathyroidism of renal origin: Secondary | ICD-10-CM | POA: Diagnosis not present

## 2012-03-10 DIAGNOSIS — D631 Anemia in chronic kidney disease: Secondary | ICD-10-CM | POA: Diagnosis not present

## 2012-03-10 DIAGNOSIS — N039 Chronic nephritic syndrome with unspecified morphologic changes: Secondary | ICD-10-CM | POA: Diagnosis not present

## 2012-03-10 DIAGNOSIS — N186 End stage renal disease: Secondary | ICD-10-CM | POA: Diagnosis not present

## 2012-03-10 DIAGNOSIS — M79609 Pain in unspecified limb: Secondary | ICD-10-CM | POA: Diagnosis not present

## 2012-03-10 DIAGNOSIS — D509 Iron deficiency anemia, unspecified: Secondary | ICD-10-CM | POA: Diagnosis not present

## 2012-03-10 DIAGNOSIS — Z992 Dependence on renal dialysis: Secondary | ICD-10-CM | POA: Diagnosis not present

## 2012-03-10 DIAGNOSIS — N2581 Secondary hyperparathyroidism of renal origin: Secondary | ICD-10-CM | POA: Diagnosis not present

## 2012-03-12 DIAGNOSIS — D631 Anemia in chronic kidney disease: Secondary | ICD-10-CM | POA: Diagnosis not present

## 2012-03-12 DIAGNOSIS — N2581 Secondary hyperparathyroidism of renal origin: Secondary | ICD-10-CM | POA: Diagnosis not present

## 2012-03-12 DIAGNOSIS — Z992 Dependence on renal dialysis: Secondary | ICD-10-CM | POA: Diagnosis not present

## 2012-03-12 DIAGNOSIS — M79609 Pain in unspecified limb: Secondary | ICD-10-CM | POA: Diagnosis not present

## 2012-03-12 DIAGNOSIS — N186 End stage renal disease: Secondary | ICD-10-CM | POA: Diagnosis not present

## 2012-03-12 DIAGNOSIS — D509 Iron deficiency anemia, unspecified: Secondary | ICD-10-CM | POA: Diagnosis not present

## 2012-03-13 DIAGNOSIS — N186 End stage renal disease: Secondary | ICD-10-CM | POA: Diagnosis not present

## 2012-03-15 DIAGNOSIS — D509 Iron deficiency anemia, unspecified: Secondary | ICD-10-CM | POA: Diagnosis not present

## 2012-03-15 DIAGNOSIS — Z992 Dependence on renal dialysis: Secondary | ICD-10-CM | POA: Diagnosis not present

## 2012-03-15 DIAGNOSIS — D631 Anemia in chronic kidney disease: Secondary | ICD-10-CM | POA: Diagnosis not present

## 2012-03-15 DIAGNOSIS — R509 Fever, unspecified: Secondary | ICD-10-CM | POA: Diagnosis not present

## 2012-03-15 DIAGNOSIS — N2581 Secondary hyperparathyroidism of renal origin: Secondary | ICD-10-CM | POA: Diagnosis not present

## 2012-03-15 DIAGNOSIS — N186 End stage renal disease: Secondary | ICD-10-CM | POA: Diagnosis not present

## 2012-03-17 DIAGNOSIS — N039 Chronic nephritic syndrome with unspecified morphologic changes: Secondary | ICD-10-CM | POA: Diagnosis not present

## 2012-03-17 DIAGNOSIS — N2581 Secondary hyperparathyroidism of renal origin: Secondary | ICD-10-CM | POA: Diagnosis not present

## 2012-03-17 DIAGNOSIS — N186 End stage renal disease: Secondary | ICD-10-CM | POA: Diagnosis not present

## 2012-03-17 DIAGNOSIS — R509 Fever, unspecified: Secondary | ICD-10-CM | POA: Diagnosis not present

## 2012-03-17 DIAGNOSIS — Z992 Dependence on renal dialysis: Secondary | ICD-10-CM | POA: Diagnosis not present

## 2012-03-17 DIAGNOSIS — D631 Anemia in chronic kidney disease: Secondary | ICD-10-CM | POA: Diagnosis not present

## 2012-03-17 DIAGNOSIS — D509 Iron deficiency anemia, unspecified: Secondary | ICD-10-CM | POA: Diagnosis not present

## 2012-03-19 DIAGNOSIS — D509 Iron deficiency anemia, unspecified: Secondary | ICD-10-CM | POA: Diagnosis not present

## 2012-03-19 DIAGNOSIS — D631 Anemia in chronic kidney disease: Secondary | ICD-10-CM | POA: Diagnosis not present

## 2012-03-19 DIAGNOSIS — R509 Fever, unspecified: Secondary | ICD-10-CM | POA: Diagnosis not present

## 2012-03-19 DIAGNOSIS — Z992 Dependence on renal dialysis: Secondary | ICD-10-CM | POA: Diagnosis not present

## 2012-03-19 DIAGNOSIS — N039 Chronic nephritic syndrome with unspecified morphologic changes: Secondary | ICD-10-CM | POA: Diagnosis not present

## 2012-03-19 DIAGNOSIS — N186 End stage renal disease: Secondary | ICD-10-CM | POA: Diagnosis not present

## 2012-03-19 DIAGNOSIS — N2581 Secondary hyperparathyroidism of renal origin: Secondary | ICD-10-CM | POA: Diagnosis not present

## 2012-03-22 DIAGNOSIS — N186 End stage renal disease: Secondary | ICD-10-CM | POA: Diagnosis not present

## 2012-03-22 DIAGNOSIS — D509 Iron deficiency anemia, unspecified: Secondary | ICD-10-CM | POA: Diagnosis not present

## 2012-03-22 DIAGNOSIS — Z992 Dependence on renal dialysis: Secondary | ICD-10-CM | POA: Diagnosis not present

## 2012-03-22 DIAGNOSIS — N2581 Secondary hyperparathyroidism of renal origin: Secondary | ICD-10-CM | POA: Diagnosis not present

## 2012-03-22 DIAGNOSIS — D631 Anemia in chronic kidney disease: Secondary | ICD-10-CM | POA: Diagnosis not present

## 2012-03-22 DIAGNOSIS — R509 Fever, unspecified: Secondary | ICD-10-CM | POA: Diagnosis not present

## 2012-03-24 DIAGNOSIS — D509 Iron deficiency anemia, unspecified: Secondary | ICD-10-CM | POA: Diagnosis not present

## 2012-03-24 DIAGNOSIS — N039 Chronic nephritic syndrome with unspecified morphologic changes: Secondary | ICD-10-CM | POA: Diagnosis not present

## 2012-03-24 DIAGNOSIS — Z992 Dependence on renal dialysis: Secondary | ICD-10-CM | POA: Diagnosis not present

## 2012-03-24 DIAGNOSIS — N186 End stage renal disease: Secondary | ICD-10-CM | POA: Diagnosis not present

## 2012-03-24 DIAGNOSIS — D631 Anemia in chronic kidney disease: Secondary | ICD-10-CM | POA: Diagnosis not present

## 2012-03-24 DIAGNOSIS — R509 Fever, unspecified: Secondary | ICD-10-CM | POA: Diagnosis not present

## 2012-03-24 DIAGNOSIS — N2581 Secondary hyperparathyroidism of renal origin: Secondary | ICD-10-CM | POA: Diagnosis not present

## 2012-03-26 DIAGNOSIS — D631 Anemia in chronic kidney disease: Secondary | ICD-10-CM | POA: Diagnosis not present

## 2012-03-26 DIAGNOSIS — N2581 Secondary hyperparathyroidism of renal origin: Secondary | ICD-10-CM | POA: Diagnosis not present

## 2012-03-26 DIAGNOSIS — Z992 Dependence on renal dialysis: Secondary | ICD-10-CM | POA: Diagnosis not present

## 2012-03-26 DIAGNOSIS — R509 Fever, unspecified: Secondary | ICD-10-CM | POA: Diagnosis not present

## 2012-03-26 DIAGNOSIS — N186 End stage renal disease: Secondary | ICD-10-CM | POA: Diagnosis not present

## 2012-03-26 DIAGNOSIS — D509 Iron deficiency anemia, unspecified: Secondary | ICD-10-CM | POA: Diagnosis not present

## 2012-03-29 DIAGNOSIS — Z992 Dependence on renal dialysis: Secondary | ICD-10-CM | POA: Diagnosis not present

## 2012-03-29 DIAGNOSIS — R509 Fever, unspecified: Secondary | ICD-10-CM | POA: Diagnosis not present

## 2012-03-29 DIAGNOSIS — N186 End stage renal disease: Secondary | ICD-10-CM | POA: Diagnosis not present

## 2012-03-29 DIAGNOSIS — D631 Anemia in chronic kidney disease: Secondary | ICD-10-CM | POA: Diagnosis not present

## 2012-03-29 DIAGNOSIS — D509 Iron deficiency anemia, unspecified: Secondary | ICD-10-CM | POA: Diagnosis not present

## 2012-03-29 DIAGNOSIS — N2581 Secondary hyperparathyroidism of renal origin: Secondary | ICD-10-CM | POA: Diagnosis not present

## 2012-03-31 DIAGNOSIS — N186 End stage renal disease: Secondary | ICD-10-CM | POA: Diagnosis not present

## 2012-03-31 DIAGNOSIS — Z992 Dependence on renal dialysis: Secondary | ICD-10-CM | POA: Diagnosis not present

## 2012-03-31 DIAGNOSIS — D509 Iron deficiency anemia, unspecified: Secondary | ICD-10-CM | POA: Diagnosis not present

## 2012-03-31 DIAGNOSIS — R509 Fever, unspecified: Secondary | ICD-10-CM | POA: Diagnosis not present

## 2012-03-31 DIAGNOSIS — D631 Anemia in chronic kidney disease: Secondary | ICD-10-CM | POA: Diagnosis not present

## 2012-03-31 DIAGNOSIS — N2581 Secondary hyperparathyroidism of renal origin: Secondary | ICD-10-CM | POA: Diagnosis not present

## 2012-04-02 DIAGNOSIS — N2581 Secondary hyperparathyroidism of renal origin: Secondary | ICD-10-CM | POA: Diagnosis not present

## 2012-04-02 DIAGNOSIS — R509 Fever, unspecified: Secondary | ICD-10-CM | POA: Diagnosis not present

## 2012-04-02 DIAGNOSIS — D631 Anemia in chronic kidney disease: Secondary | ICD-10-CM | POA: Diagnosis not present

## 2012-04-02 DIAGNOSIS — Z992 Dependence on renal dialysis: Secondary | ICD-10-CM | POA: Diagnosis not present

## 2012-04-02 DIAGNOSIS — N186 End stage renal disease: Secondary | ICD-10-CM | POA: Diagnosis not present

## 2012-04-02 DIAGNOSIS — D509 Iron deficiency anemia, unspecified: Secondary | ICD-10-CM | POA: Diagnosis not present

## 2012-04-05 DIAGNOSIS — D509 Iron deficiency anemia, unspecified: Secondary | ICD-10-CM | POA: Diagnosis not present

## 2012-04-05 DIAGNOSIS — N186 End stage renal disease: Secondary | ICD-10-CM | POA: Diagnosis not present

## 2012-04-05 DIAGNOSIS — R509 Fever, unspecified: Secondary | ICD-10-CM | POA: Diagnosis not present

## 2012-04-05 DIAGNOSIS — D631 Anemia in chronic kidney disease: Secondary | ICD-10-CM | POA: Diagnosis not present

## 2012-04-05 DIAGNOSIS — N2581 Secondary hyperparathyroidism of renal origin: Secondary | ICD-10-CM | POA: Diagnosis not present

## 2012-04-05 DIAGNOSIS — Z992 Dependence on renal dialysis: Secondary | ICD-10-CM | POA: Diagnosis not present

## 2012-04-07 DIAGNOSIS — N039 Chronic nephritic syndrome with unspecified morphologic changes: Secondary | ICD-10-CM | POA: Diagnosis not present

## 2012-04-07 DIAGNOSIS — N2581 Secondary hyperparathyroidism of renal origin: Secondary | ICD-10-CM | POA: Diagnosis not present

## 2012-04-07 DIAGNOSIS — N186 End stage renal disease: Secondary | ICD-10-CM | POA: Diagnosis not present

## 2012-04-07 DIAGNOSIS — R509 Fever, unspecified: Secondary | ICD-10-CM | POA: Diagnosis not present

## 2012-04-07 DIAGNOSIS — Z992 Dependence on renal dialysis: Secondary | ICD-10-CM | POA: Diagnosis not present

## 2012-04-07 DIAGNOSIS — D509 Iron deficiency anemia, unspecified: Secondary | ICD-10-CM | POA: Diagnosis not present

## 2012-04-07 DIAGNOSIS — D631 Anemia in chronic kidney disease: Secondary | ICD-10-CM | POA: Diagnosis not present

## 2012-04-09 DIAGNOSIS — N186 End stage renal disease: Secondary | ICD-10-CM | POA: Diagnosis not present

## 2012-04-09 DIAGNOSIS — N2581 Secondary hyperparathyroidism of renal origin: Secondary | ICD-10-CM | POA: Diagnosis not present

## 2012-04-09 DIAGNOSIS — N039 Chronic nephritic syndrome with unspecified morphologic changes: Secondary | ICD-10-CM | POA: Diagnosis not present

## 2012-04-09 DIAGNOSIS — D631 Anemia in chronic kidney disease: Secondary | ICD-10-CM | POA: Diagnosis not present

## 2012-04-09 DIAGNOSIS — R509 Fever, unspecified: Secondary | ICD-10-CM | POA: Diagnosis not present

## 2012-04-09 DIAGNOSIS — D509 Iron deficiency anemia, unspecified: Secondary | ICD-10-CM | POA: Diagnosis not present

## 2012-04-09 DIAGNOSIS — Z992 Dependence on renal dialysis: Secondary | ICD-10-CM | POA: Diagnosis not present

## 2012-04-12 DIAGNOSIS — D631 Anemia in chronic kidney disease: Secondary | ICD-10-CM | POA: Diagnosis not present

## 2012-04-12 DIAGNOSIS — Z992 Dependence on renal dialysis: Secondary | ICD-10-CM | POA: Diagnosis not present

## 2012-04-12 DIAGNOSIS — N2581 Secondary hyperparathyroidism of renal origin: Secondary | ICD-10-CM | POA: Diagnosis not present

## 2012-04-12 DIAGNOSIS — D509 Iron deficiency anemia, unspecified: Secondary | ICD-10-CM | POA: Diagnosis not present

## 2012-04-12 DIAGNOSIS — R509 Fever, unspecified: Secondary | ICD-10-CM | POA: Diagnosis not present

## 2012-04-12 DIAGNOSIS — N186 End stage renal disease: Secondary | ICD-10-CM | POA: Diagnosis not present

## 2012-04-14 DIAGNOSIS — N2581 Secondary hyperparathyroidism of renal origin: Secondary | ICD-10-CM | POA: Diagnosis not present

## 2012-04-14 DIAGNOSIS — Z992 Dependence on renal dialysis: Secondary | ICD-10-CM | POA: Diagnosis not present

## 2012-04-14 DIAGNOSIS — D631 Anemia in chronic kidney disease: Secondary | ICD-10-CM | POA: Diagnosis not present

## 2012-04-14 DIAGNOSIS — N186 End stage renal disease: Secondary | ICD-10-CM | POA: Diagnosis not present

## 2012-04-14 DIAGNOSIS — D509 Iron deficiency anemia, unspecified: Secondary | ICD-10-CM | POA: Diagnosis not present

## 2012-04-20 DIAGNOSIS — R61 Generalized hyperhidrosis: Secondary | ICD-10-CM | POA: Diagnosis not present

## 2012-04-20 DIAGNOSIS — G4700 Insomnia, unspecified: Secondary | ICD-10-CM | POA: Diagnosis not present

## 2012-05-13 DIAGNOSIS — N186 End stage renal disease: Secondary | ICD-10-CM | POA: Diagnosis not present

## 2012-05-14 DIAGNOSIS — D631 Anemia in chronic kidney disease: Secondary | ICD-10-CM | POA: Diagnosis not present

## 2012-05-14 DIAGNOSIS — D509 Iron deficiency anemia, unspecified: Secondary | ICD-10-CM | POA: Diagnosis not present

## 2012-05-14 DIAGNOSIS — N2581 Secondary hyperparathyroidism of renal origin: Secondary | ICD-10-CM | POA: Diagnosis not present

## 2012-05-14 DIAGNOSIS — Z992 Dependence on renal dialysis: Secondary | ICD-10-CM | POA: Diagnosis not present

## 2012-05-14 DIAGNOSIS — N186 End stage renal disease: Secondary | ICD-10-CM | POA: Diagnosis not present

## 2012-05-17 DIAGNOSIS — N2581 Secondary hyperparathyroidism of renal origin: Secondary | ICD-10-CM | POA: Diagnosis not present

## 2012-05-17 DIAGNOSIS — N039 Chronic nephritic syndrome with unspecified morphologic changes: Secondary | ICD-10-CM | POA: Diagnosis not present

## 2012-05-17 DIAGNOSIS — Z992 Dependence on renal dialysis: Secondary | ICD-10-CM | POA: Diagnosis not present

## 2012-05-17 DIAGNOSIS — D631 Anemia in chronic kidney disease: Secondary | ICD-10-CM | POA: Diagnosis not present

## 2012-05-17 DIAGNOSIS — D509 Iron deficiency anemia, unspecified: Secondary | ICD-10-CM | POA: Diagnosis not present

## 2012-05-17 DIAGNOSIS — N186 End stage renal disease: Secondary | ICD-10-CM | POA: Diagnosis not present

## 2012-05-19 DIAGNOSIS — D509 Iron deficiency anemia, unspecified: Secondary | ICD-10-CM | POA: Diagnosis not present

## 2012-05-19 DIAGNOSIS — Z992 Dependence on renal dialysis: Secondary | ICD-10-CM | POA: Diagnosis not present

## 2012-05-19 DIAGNOSIS — D631 Anemia in chronic kidney disease: Secondary | ICD-10-CM | POA: Diagnosis not present

## 2012-05-19 DIAGNOSIS — N186 End stage renal disease: Secondary | ICD-10-CM | POA: Diagnosis not present

## 2012-05-19 DIAGNOSIS — N2581 Secondary hyperparathyroidism of renal origin: Secondary | ICD-10-CM | POA: Diagnosis not present

## 2012-05-21 DIAGNOSIS — Z992 Dependence on renal dialysis: Secondary | ICD-10-CM | POA: Diagnosis not present

## 2012-05-21 DIAGNOSIS — D631 Anemia in chronic kidney disease: Secondary | ICD-10-CM | POA: Diagnosis not present

## 2012-05-21 DIAGNOSIS — N2581 Secondary hyperparathyroidism of renal origin: Secondary | ICD-10-CM | POA: Diagnosis not present

## 2012-05-21 DIAGNOSIS — N186 End stage renal disease: Secondary | ICD-10-CM | POA: Diagnosis not present

## 2012-05-21 DIAGNOSIS — N039 Chronic nephritic syndrome with unspecified morphologic changes: Secondary | ICD-10-CM | POA: Diagnosis not present

## 2012-05-21 DIAGNOSIS — D509 Iron deficiency anemia, unspecified: Secondary | ICD-10-CM | POA: Diagnosis not present

## 2012-05-24 DIAGNOSIS — D631 Anemia in chronic kidney disease: Secondary | ICD-10-CM | POA: Diagnosis not present

## 2012-05-24 DIAGNOSIS — Z992 Dependence on renal dialysis: Secondary | ICD-10-CM | POA: Diagnosis not present

## 2012-05-24 DIAGNOSIS — N186 End stage renal disease: Secondary | ICD-10-CM | POA: Diagnosis not present

## 2012-05-24 DIAGNOSIS — D509 Iron deficiency anemia, unspecified: Secondary | ICD-10-CM | POA: Diagnosis not present

## 2012-05-24 DIAGNOSIS — N2581 Secondary hyperparathyroidism of renal origin: Secondary | ICD-10-CM | POA: Diagnosis not present

## 2012-05-26 DIAGNOSIS — D631 Anemia in chronic kidney disease: Secondary | ICD-10-CM | POA: Diagnosis not present

## 2012-05-26 DIAGNOSIS — N2581 Secondary hyperparathyroidism of renal origin: Secondary | ICD-10-CM | POA: Diagnosis not present

## 2012-05-26 DIAGNOSIS — Z992 Dependence on renal dialysis: Secondary | ICD-10-CM | POA: Diagnosis not present

## 2012-05-26 DIAGNOSIS — D509 Iron deficiency anemia, unspecified: Secondary | ICD-10-CM | POA: Diagnosis not present

## 2012-05-26 DIAGNOSIS — N039 Chronic nephritic syndrome with unspecified morphologic changes: Secondary | ICD-10-CM | POA: Diagnosis not present

## 2012-05-26 DIAGNOSIS — N186 End stage renal disease: Secondary | ICD-10-CM | POA: Diagnosis not present

## 2012-05-28 DIAGNOSIS — D631 Anemia in chronic kidney disease: Secondary | ICD-10-CM | POA: Diagnosis not present

## 2012-05-28 DIAGNOSIS — N186 End stage renal disease: Secondary | ICD-10-CM | POA: Diagnosis not present

## 2012-05-28 DIAGNOSIS — N2581 Secondary hyperparathyroidism of renal origin: Secondary | ICD-10-CM | POA: Diagnosis not present

## 2012-05-28 DIAGNOSIS — Z992 Dependence on renal dialysis: Secondary | ICD-10-CM | POA: Diagnosis not present

## 2012-05-28 DIAGNOSIS — N039 Chronic nephritic syndrome with unspecified morphologic changes: Secondary | ICD-10-CM | POA: Diagnosis not present

## 2012-05-28 DIAGNOSIS — D509 Iron deficiency anemia, unspecified: Secondary | ICD-10-CM | POA: Diagnosis not present

## 2012-05-31 DIAGNOSIS — D509 Iron deficiency anemia, unspecified: Secondary | ICD-10-CM | POA: Diagnosis not present

## 2012-05-31 DIAGNOSIS — N2581 Secondary hyperparathyroidism of renal origin: Secondary | ICD-10-CM | POA: Diagnosis not present

## 2012-05-31 DIAGNOSIS — Z992 Dependence on renal dialysis: Secondary | ICD-10-CM | POA: Diagnosis not present

## 2012-05-31 DIAGNOSIS — N186 End stage renal disease: Secondary | ICD-10-CM | POA: Diagnosis not present

## 2012-05-31 DIAGNOSIS — D631 Anemia in chronic kidney disease: Secondary | ICD-10-CM | POA: Diagnosis not present

## 2012-05-31 IMAGING — XA IR SHUNTOGRAM/ FISTULAGRAM
1 series · 13 of 23 positions shown · non-contrast
Comparison: none

CLINICAL HISTORY: 60-year-old pulling clots in left upper arm
fistula.

PROCEDURE(S): LEFT UPPER EXTREMITY FISTULOGRAM
Medications:None
Moderate sedation time:None
Fluoroscopy time: 0.8 minutes
Procedure:The left forearm fistula was accessed with an angiocath.
A series of fistulogram images were obtained.  Angiocath removed
with manual compression.

[Series 1: run · 13 of 23 slices shown]
[im 1/23]
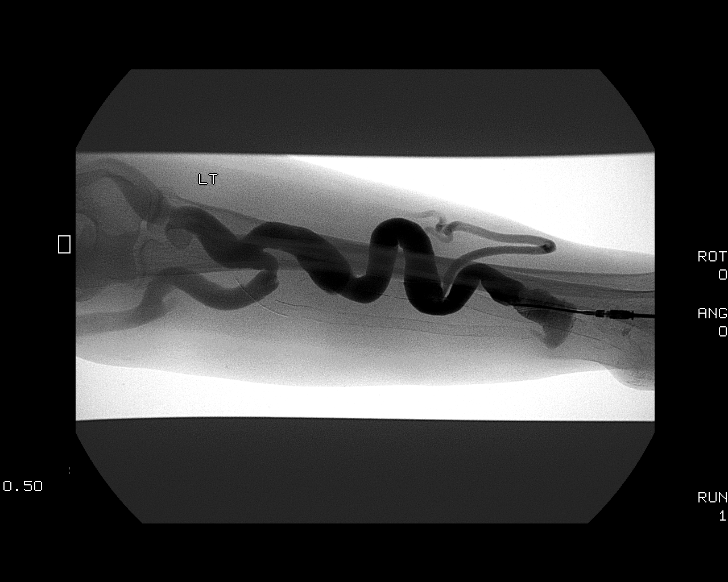
[im 3/23]
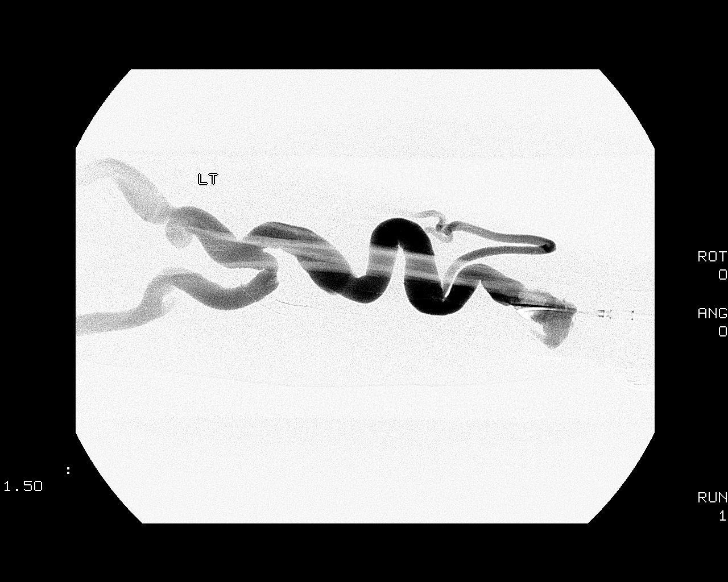
[im 5/23]
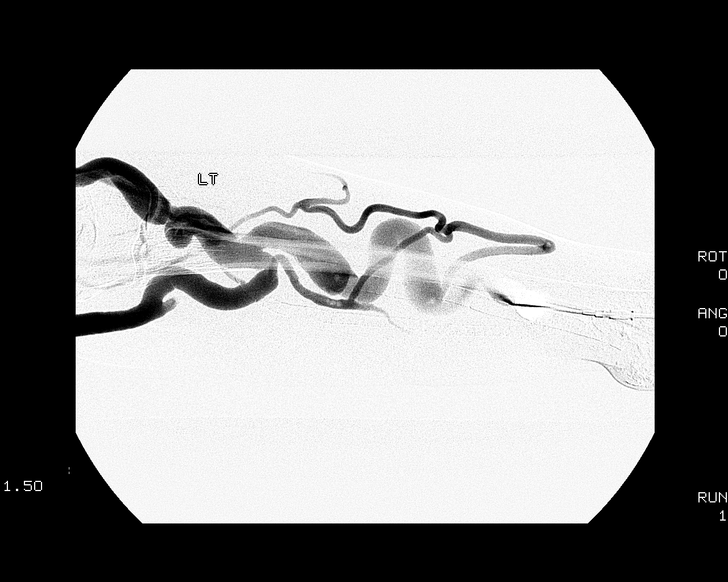
[im 7/23]
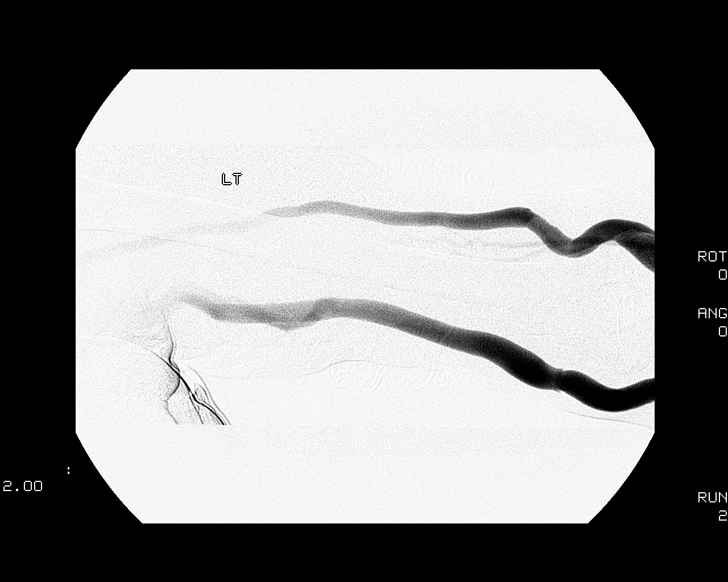
[im 8/23]
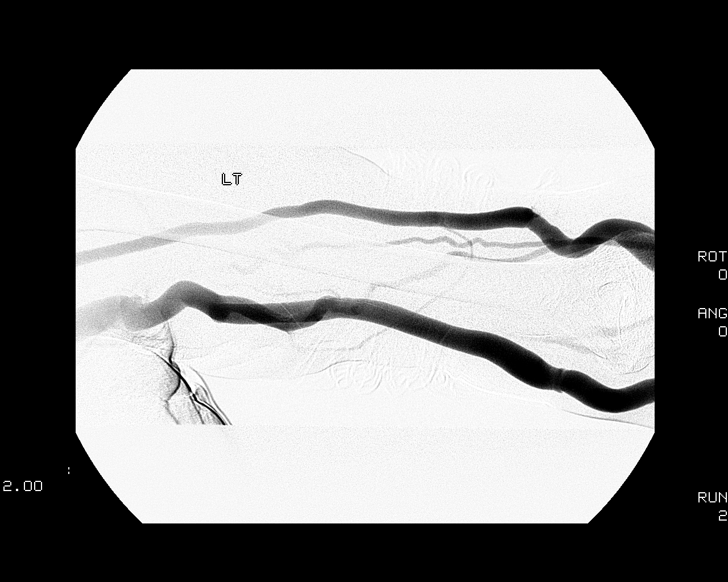
[im 10/23]
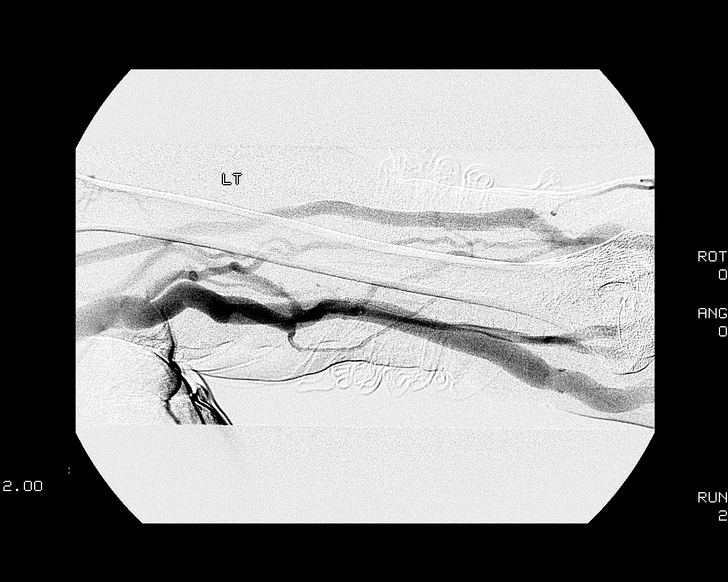
[im 12/23]
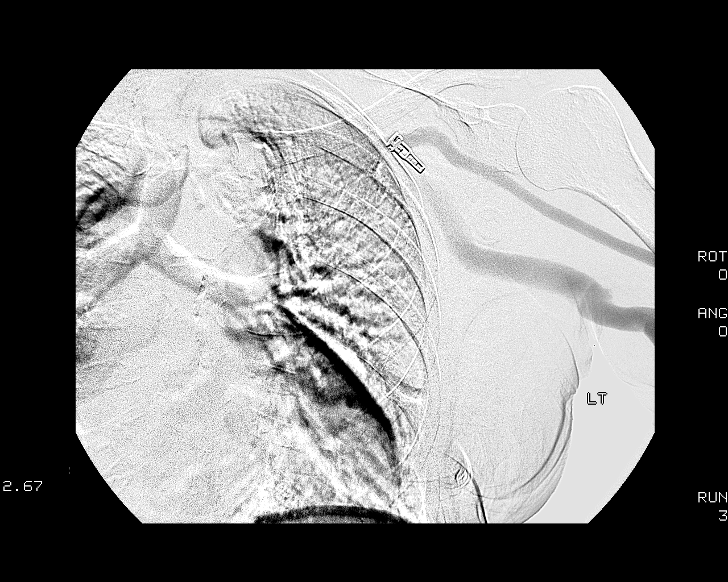
[im 14/23]
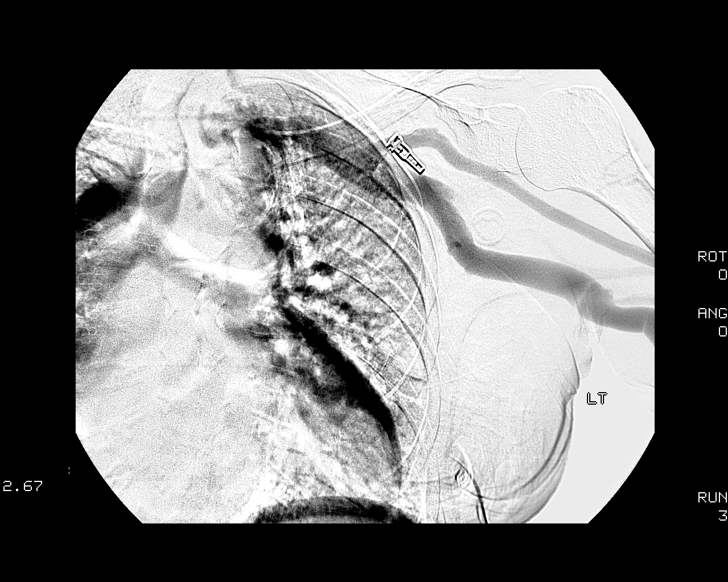
[im 16/23]
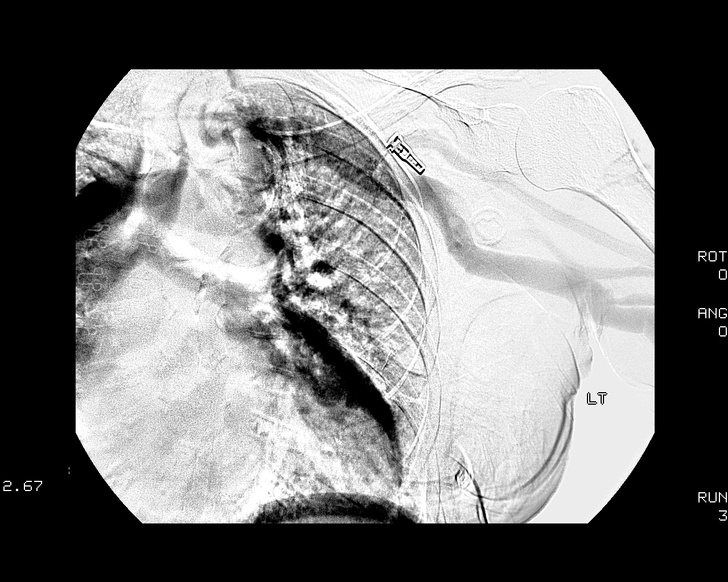
[im 17/23]
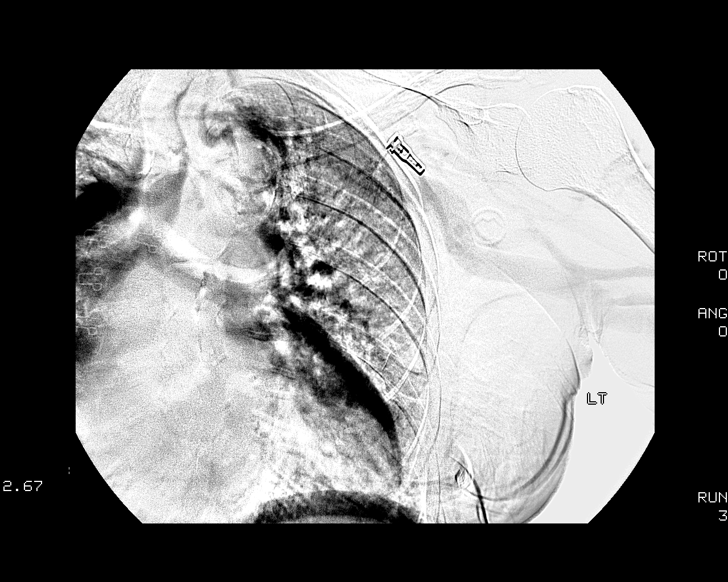
[im 19/23]
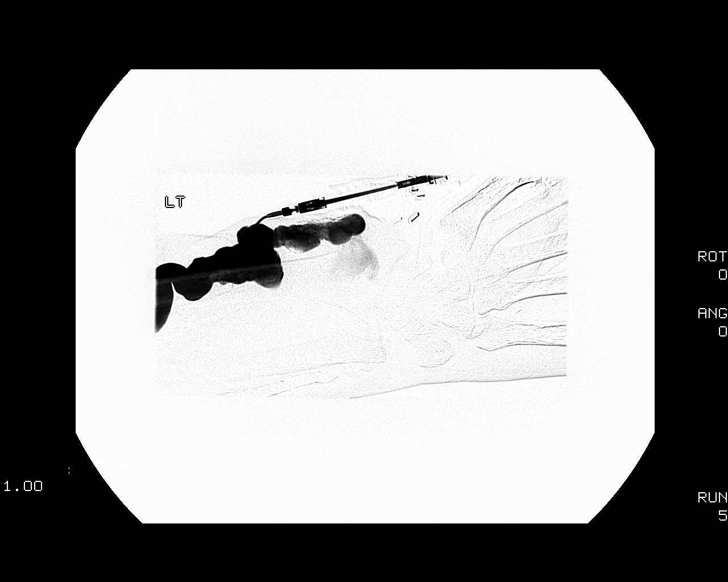
[im 21/23]
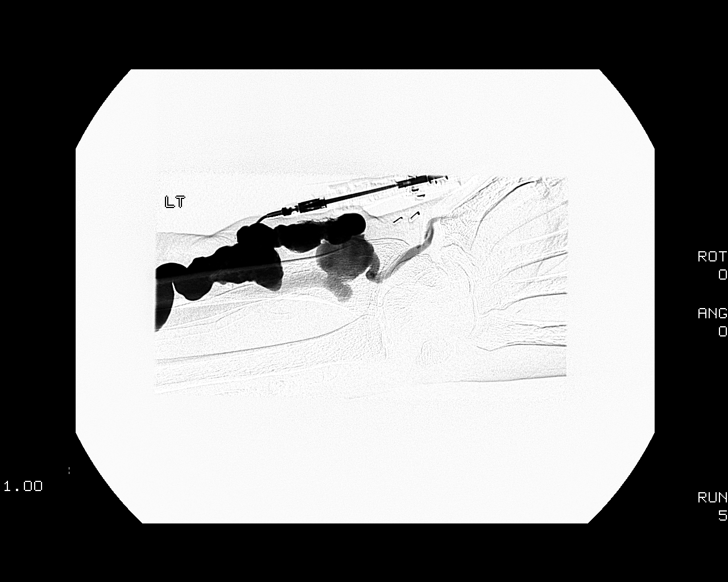
[im 23/23]
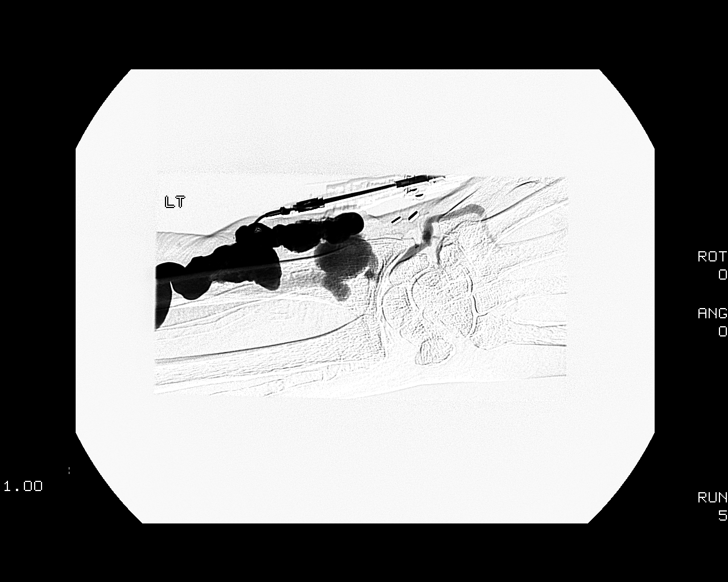

[13 of 23 positions shown; findings below may reference images not displayed]

FINDINGS: The left forearm fistula is very tortuous and suspect
calcified walls near the wrist.  The draining brachial and cephalic
veins are large and tortuous.  The central veins appear to be
patent.  Aneurysmal formation near the arterial anastomosis. There
is one prominent collateral vessel in the forearm but this is
probably not hemodynamically significant.

Complications: None
IMPRESSION: The left forearm fistula is very tortuous and suspect
wall calcifications.  No evidence for a critical stenosis.

## 2012-06-02 DIAGNOSIS — N2581 Secondary hyperparathyroidism of renal origin: Secondary | ICD-10-CM | POA: Diagnosis not present

## 2012-06-02 DIAGNOSIS — Z992 Dependence on renal dialysis: Secondary | ICD-10-CM | POA: Diagnosis not present

## 2012-06-02 DIAGNOSIS — D509 Iron deficiency anemia, unspecified: Secondary | ICD-10-CM | POA: Diagnosis not present

## 2012-06-02 DIAGNOSIS — D631 Anemia in chronic kidney disease: Secondary | ICD-10-CM | POA: Diagnosis not present

## 2012-06-02 DIAGNOSIS — N186 End stage renal disease: Secondary | ICD-10-CM | POA: Diagnosis not present

## 2012-06-04 DIAGNOSIS — N2581 Secondary hyperparathyroidism of renal origin: Secondary | ICD-10-CM | POA: Diagnosis not present

## 2012-06-04 DIAGNOSIS — Z992 Dependence on renal dialysis: Secondary | ICD-10-CM | POA: Diagnosis not present

## 2012-06-04 DIAGNOSIS — D631 Anemia in chronic kidney disease: Secondary | ICD-10-CM | POA: Diagnosis not present

## 2012-06-04 DIAGNOSIS — N186 End stage renal disease: Secondary | ICD-10-CM | POA: Diagnosis not present

## 2012-06-04 DIAGNOSIS — D509 Iron deficiency anemia, unspecified: Secondary | ICD-10-CM | POA: Diagnosis not present

## 2012-06-07 DIAGNOSIS — N2581 Secondary hyperparathyroidism of renal origin: Secondary | ICD-10-CM | POA: Diagnosis not present

## 2012-06-07 DIAGNOSIS — D631 Anemia in chronic kidney disease: Secondary | ICD-10-CM | POA: Diagnosis not present

## 2012-06-07 DIAGNOSIS — N186 End stage renal disease: Secondary | ICD-10-CM | POA: Diagnosis not present

## 2012-06-07 DIAGNOSIS — D509 Iron deficiency anemia, unspecified: Secondary | ICD-10-CM | POA: Diagnosis not present

## 2012-06-07 DIAGNOSIS — Z992 Dependence on renal dialysis: Secondary | ICD-10-CM | POA: Diagnosis not present

## 2012-06-09 DIAGNOSIS — N039 Chronic nephritic syndrome with unspecified morphologic changes: Secondary | ICD-10-CM | POA: Diagnosis not present

## 2012-06-09 DIAGNOSIS — Z992 Dependence on renal dialysis: Secondary | ICD-10-CM | POA: Diagnosis not present

## 2012-06-09 DIAGNOSIS — N2581 Secondary hyperparathyroidism of renal origin: Secondary | ICD-10-CM | POA: Diagnosis not present

## 2012-06-09 DIAGNOSIS — D631 Anemia in chronic kidney disease: Secondary | ICD-10-CM | POA: Diagnosis not present

## 2012-06-09 DIAGNOSIS — N186 End stage renal disease: Secondary | ICD-10-CM | POA: Diagnosis not present

## 2012-06-09 DIAGNOSIS — D509 Iron deficiency anemia, unspecified: Secondary | ICD-10-CM | POA: Diagnosis not present

## 2012-06-11 DIAGNOSIS — D631 Anemia in chronic kidney disease: Secondary | ICD-10-CM | POA: Diagnosis not present

## 2012-06-11 DIAGNOSIS — N186 End stage renal disease: Secondary | ICD-10-CM | POA: Diagnosis not present

## 2012-06-11 DIAGNOSIS — Z992 Dependence on renal dialysis: Secondary | ICD-10-CM | POA: Diagnosis not present

## 2012-06-11 DIAGNOSIS — N2581 Secondary hyperparathyroidism of renal origin: Secondary | ICD-10-CM | POA: Diagnosis not present

## 2012-06-11 DIAGNOSIS — D509 Iron deficiency anemia, unspecified: Secondary | ICD-10-CM | POA: Diagnosis not present

## 2012-06-12 DIAGNOSIS — N186 End stage renal disease: Secondary | ICD-10-CM | POA: Diagnosis not present

## 2012-06-14 DIAGNOSIS — N186 End stage renal disease: Secondary | ICD-10-CM | POA: Diagnosis not present

## 2012-06-14 DIAGNOSIS — N2581 Secondary hyperparathyroidism of renal origin: Secondary | ICD-10-CM | POA: Diagnosis not present

## 2012-06-14 DIAGNOSIS — D631 Anemia in chronic kidney disease: Secondary | ICD-10-CM | POA: Diagnosis not present

## 2012-06-14 DIAGNOSIS — Z992 Dependence on renal dialysis: Secondary | ICD-10-CM | POA: Diagnosis not present

## 2012-06-14 DIAGNOSIS — D509 Iron deficiency anemia, unspecified: Secondary | ICD-10-CM | POA: Diagnosis not present

## 2012-06-17 DIAGNOSIS — N760 Acute vaginitis: Secondary | ICD-10-CM | POA: Diagnosis not present

## 2012-06-17 DIAGNOSIS — R310 Gross hematuria: Secondary | ICD-10-CM | POA: Diagnosis not present

## 2012-06-20 DIAGNOSIS — N390 Urinary tract infection, site not specified: Secondary | ICD-10-CM | POA: Diagnosis not present

## 2012-06-20 DIAGNOSIS — N186 End stage renal disease: Secondary | ICD-10-CM | POA: Diagnosis not present

## 2012-07-13 DIAGNOSIS — N186 End stage renal disease: Secondary | ICD-10-CM | POA: Diagnosis not present

## 2012-07-14 DIAGNOSIS — N2581 Secondary hyperparathyroidism of renal origin: Secondary | ICD-10-CM | POA: Diagnosis not present

## 2012-07-14 DIAGNOSIS — Z23 Encounter for immunization: Secondary | ICD-10-CM | POA: Diagnosis not present

## 2012-07-14 DIAGNOSIS — Z992 Dependence on renal dialysis: Secondary | ICD-10-CM | POA: Diagnosis not present

## 2012-07-14 DIAGNOSIS — D631 Anemia in chronic kidney disease: Secondary | ICD-10-CM | POA: Diagnosis not present

## 2012-07-14 DIAGNOSIS — N186 End stage renal disease: Secondary | ICD-10-CM | POA: Diagnosis not present

## 2012-07-14 DIAGNOSIS — D509 Iron deficiency anemia, unspecified: Secondary | ICD-10-CM | POA: Diagnosis not present

## 2012-07-27 DIAGNOSIS — R310 Gross hematuria: Secondary | ICD-10-CM | POA: Diagnosis not present

## 2012-07-27 DIAGNOSIS — N186 End stage renal disease: Secondary | ICD-10-CM | POA: Diagnosis not present

## 2012-08-13 DIAGNOSIS — N186 End stage renal disease: Secondary | ICD-10-CM | POA: Diagnosis not present

## 2012-08-16 DIAGNOSIS — D509 Iron deficiency anemia, unspecified: Secondary | ICD-10-CM | POA: Diagnosis not present

## 2012-08-16 DIAGNOSIS — N186 End stage renal disease: Secondary | ICD-10-CM | POA: Diagnosis not present

## 2012-08-16 DIAGNOSIS — N2581 Secondary hyperparathyroidism of renal origin: Secondary | ICD-10-CM | POA: Diagnosis not present

## 2012-08-16 DIAGNOSIS — D631 Anemia in chronic kidney disease: Secondary | ICD-10-CM | POA: Diagnosis not present

## 2012-08-16 DIAGNOSIS — Z23 Encounter for immunization: Secondary | ICD-10-CM | POA: Diagnosis not present

## 2012-08-16 DIAGNOSIS — Z992 Dependence on renal dialysis: Secondary | ICD-10-CM | POA: Diagnosis not present

## 2012-08-18 ENCOUNTER — Other Ambulatory Visit (HOSPITAL_COMMUNITY): Payer: Self-pay | Admitting: Urology

## 2012-08-18 DIAGNOSIS — N361 Urethral diverticulum: Secondary | ICD-10-CM

## 2012-08-19 ENCOUNTER — Ambulatory Visit (HOSPITAL_COMMUNITY)
Admission: RE | Admit: 2012-08-19 | Discharge: 2012-08-19 | Disposition: A | Discharge status: Home / Self Care | Payer: Medicare Other | Source: Ambulatory Visit | Attending: Urology | Admitting: Urology

## 2012-08-19 DIAGNOSIS — D259 Leiomyoma of uterus, unspecified: Secondary | ICD-10-CM | POA: Insufficient documentation

## 2012-08-19 DIAGNOSIS — Z940 Kidney transplant status: Secondary | ICD-10-CM | POA: Diagnosis not present

## 2012-08-19 DIAGNOSIS — N329 Bladder disorder, unspecified: Secondary | ICD-10-CM | POA: Diagnosis not present

## 2012-08-19 DIAGNOSIS — N3289 Other specified disorders of bladder: Secondary | ICD-10-CM | POA: Insufficient documentation

## 2012-08-19 DIAGNOSIS — N361 Urethral diverticulum: Secondary | ICD-10-CM

## 2012-09-12 DIAGNOSIS — N186 End stage renal disease: Secondary | ICD-10-CM | POA: Diagnosis not present

## 2012-09-13 DIAGNOSIS — N2581 Secondary hyperparathyroidism of renal origin: Secondary | ICD-10-CM | POA: Diagnosis not present

## 2012-09-13 DIAGNOSIS — N186 End stage renal disease: Secondary | ICD-10-CM | POA: Diagnosis not present

## 2012-09-13 DIAGNOSIS — Z992 Dependence on renal dialysis: Secondary | ICD-10-CM | POA: Diagnosis not present

## 2012-09-13 DIAGNOSIS — N039 Chronic nephritic syndrome with unspecified morphologic changes: Secondary | ICD-10-CM | POA: Diagnosis not present

## 2012-09-13 DIAGNOSIS — D631 Anemia in chronic kidney disease: Secondary | ICD-10-CM | POA: Diagnosis not present

## 2012-09-13 DIAGNOSIS — D509 Iron deficiency anemia, unspecified: Secondary | ICD-10-CM | POA: Diagnosis not present

## 2012-09-15 DIAGNOSIS — D631 Anemia in chronic kidney disease: Secondary | ICD-10-CM | POA: Diagnosis not present

## 2012-09-15 DIAGNOSIS — N186 End stage renal disease: Secondary | ICD-10-CM | POA: Diagnosis not present

## 2012-09-15 DIAGNOSIS — N2581 Secondary hyperparathyroidism of renal origin: Secondary | ICD-10-CM | POA: Diagnosis not present

## 2012-09-15 DIAGNOSIS — D509 Iron deficiency anemia, unspecified: Secondary | ICD-10-CM | POA: Diagnosis not present

## 2012-09-15 DIAGNOSIS — Z992 Dependence on renal dialysis: Secondary | ICD-10-CM | POA: Diagnosis not present

## 2012-09-15 DIAGNOSIS — N039 Chronic nephritic syndrome with unspecified morphologic changes: Secondary | ICD-10-CM | POA: Diagnosis not present

## 2012-09-17 DIAGNOSIS — N186 End stage renal disease: Secondary | ICD-10-CM | POA: Diagnosis not present

## 2012-09-17 DIAGNOSIS — N2581 Secondary hyperparathyroidism of renal origin: Secondary | ICD-10-CM | POA: Diagnosis not present

## 2012-09-17 DIAGNOSIS — Z992 Dependence on renal dialysis: Secondary | ICD-10-CM | POA: Diagnosis not present

## 2012-09-17 DIAGNOSIS — D509 Iron deficiency anemia, unspecified: Secondary | ICD-10-CM | POA: Diagnosis not present

## 2012-09-17 DIAGNOSIS — D631 Anemia in chronic kidney disease: Secondary | ICD-10-CM | POA: Diagnosis not present

## 2012-09-20 DIAGNOSIS — N186 End stage renal disease: Secondary | ICD-10-CM | POA: Diagnosis not present

## 2012-09-20 DIAGNOSIS — Z992 Dependence on renal dialysis: Secondary | ICD-10-CM | POA: Diagnosis not present

## 2012-09-20 DIAGNOSIS — N039 Chronic nephritic syndrome with unspecified morphologic changes: Secondary | ICD-10-CM | POA: Diagnosis not present

## 2012-09-20 DIAGNOSIS — N2581 Secondary hyperparathyroidism of renal origin: Secondary | ICD-10-CM | POA: Diagnosis not present

## 2012-09-20 DIAGNOSIS — D631 Anemia in chronic kidney disease: Secondary | ICD-10-CM | POA: Diagnosis not present

## 2012-09-20 DIAGNOSIS — D509 Iron deficiency anemia, unspecified: Secondary | ICD-10-CM | POA: Diagnosis not present

## 2012-09-22 DIAGNOSIS — D509 Iron deficiency anemia, unspecified: Secondary | ICD-10-CM | POA: Diagnosis not present

## 2012-09-22 DIAGNOSIS — N2581 Secondary hyperparathyroidism of renal origin: Secondary | ICD-10-CM | POA: Diagnosis not present

## 2012-09-22 DIAGNOSIS — N186 End stage renal disease: Secondary | ICD-10-CM | POA: Diagnosis not present

## 2012-09-22 DIAGNOSIS — Z992 Dependence on renal dialysis: Secondary | ICD-10-CM | POA: Diagnosis not present

## 2012-09-22 DIAGNOSIS — D631 Anemia in chronic kidney disease: Secondary | ICD-10-CM | POA: Diagnosis not present

## 2012-09-22 DIAGNOSIS — N039 Chronic nephritic syndrome with unspecified morphologic changes: Secondary | ICD-10-CM | POA: Diagnosis not present

## 2012-09-24 DIAGNOSIS — D509 Iron deficiency anemia, unspecified: Secondary | ICD-10-CM | POA: Diagnosis not present

## 2012-09-24 DIAGNOSIS — D631 Anemia in chronic kidney disease: Secondary | ICD-10-CM | POA: Diagnosis not present

## 2012-09-24 DIAGNOSIS — N186 End stage renal disease: Secondary | ICD-10-CM | POA: Diagnosis not present

## 2012-09-24 DIAGNOSIS — N2581 Secondary hyperparathyroidism of renal origin: Secondary | ICD-10-CM | POA: Diagnosis not present

## 2012-09-24 DIAGNOSIS — Z992 Dependence on renal dialysis: Secondary | ICD-10-CM | POA: Diagnosis not present

## 2012-09-24 DIAGNOSIS — N039 Chronic nephritic syndrome with unspecified morphologic changes: Secondary | ICD-10-CM | POA: Diagnosis not present

## 2012-09-27 DIAGNOSIS — N186 End stage renal disease: Secondary | ICD-10-CM | POA: Diagnosis not present

## 2012-09-27 DIAGNOSIS — D631 Anemia in chronic kidney disease: Secondary | ICD-10-CM | POA: Diagnosis not present

## 2012-09-27 DIAGNOSIS — N2581 Secondary hyperparathyroidism of renal origin: Secondary | ICD-10-CM | POA: Diagnosis not present

## 2012-09-27 DIAGNOSIS — Z992 Dependence on renal dialysis: Secondary | ICD-10-CM | POA: Diagnosis not present

## 2012-09-27 DIAGNOSIS — D509 Iron deficiency anemia, unspecified: Secondary | ICD-10-CM | POA: Diagnosis not present

## 2012-09-29 DIAGNOSIS — Z992 Dependence on renal dialysis: Secondary | ICD-10-CM | POA: Diagnosis not present

## 2012-09-29 DIAGNOSIS — N2581 Secondary hyperparathyroidism of renal origin: Secondary | ICD-10-CM | POA: Diagnosis not present

## 2012-09-29 DIAGNOSIS — D509 Iron deficiency anemia, unspecified: Secondary | ICD-10-CM | POA: Diagnosis not present

## 2012-09-29 DIAGNOSIS — N039 Chronic nephritic syndrome with unspecified morphologic changes: Secondary | ICD-10-CM | POA: Diagnosis not present

## 2012-09-29 DIAGNOSIS — N186 End stage renal disease: Secondary | ICD-10-CM | POA: Diagnosis not present

## 2012-09-29 DIAGNOSIS — D631 Anemia in chronic kidney disease: Secondary | ICD-10-CM | POA: Diagnosis not present

## 2012-10-01 DIAGNOSIS — N2581 Secondary hyperparathyroidism of renal origin: Secondary | ICD-10-CM | POA: Diagnosis not present

## 2012-10-01 DIAGNOSIS — Z992 Dependence on renal dialysis: Secondary | ICD-10-CM | POA: Diagnosis not present

## 2012-10-01 DIAGNOSIS — D631 Anemia in chronic kidney disease: Secondary | ICD-10-CM | POA: Diagnosis not present

## 2012-10-01 DIAGNOSIS — N186 End stage renal disease: Secondary | ICD-10-CM | POA: Diagnosis not present

## 2012-10-01 DIAGNOSIS — D509 Iron deficiency anemia, unspecified: Secondary | ICD-10-CM | POA: Diagnosis not present

## 2012-10-01 DIAGNOSIS — N039 Chronic nephritic syndrome with unspecified morphologic changes: Secondary | ICD-10-CM | POA: Diagnosis not present

## 2012-10-03 DIAGNOSIS — R310 Gross hematuria: Secondary | ICD-10-CM | POA: Diagnosis not present

## 2012-10-03 DIAGNOSIS — N186 End stage renal disease: Secondary | ICD-10-CM | POA: Diagnosis not present

## 2012-10-04 DIAGNOSIS — N2581 Secondary hyperparathyroidism of renal origin: Secondary | ICD-10-CM | POA: Diagnosis not present

## 2012-10-04 DIAGNOSIS — N186 End stage renal disease: Secondary | ICD-10-CM | POA: Diagnosis not present

## 2012-10-04 DIAGNOSIS — D631 Anemia in chronic kidney disease: Secondary | ICD-10-CM | POA: Diagnosis not present

## 2012-10-04 DIAGNOSIS — D509 Iron deficiency anemia, unspecified: Secondary | ICD-10-CM | POA: Diagnosis not present

## 2012-10-04 DIAGNOSIS — Z992 Dependence on renal dialysis: Secondary | ICD-10-CM | POA: Diagnosis not present

## 2012-10-06 DIAGNOSIS — N2581 Secondary hyperparathyroidism of renal origin: Secondary | ICD-10-CM | POA: Diagnosis not present

## 2012-10-06 DIAGNOSIS — N186 End stage renal disease: Secondary | ICD-10-CM | POA: Diagnosis not present

## 2012-10-06 DIAGNOSIS — D509 Iron deficiency anemia, unspecified: Secondary | ICD-10-CM | POA: Diagnosis not present

## 2012-10-06 DIAGNOSIS — Z992 Dependence on renal dialysis: Secondary | ICD-10-CM | POA: Diagnosis not present

## 2012-10-06 DIAGNOSIS — D631 Anemia in chronic kidney disease: Secondary | ICD-10-CM | POA: Diagnosis not present

## 2012-10-08 DIAGNOSIS — N2581 Secondary hyperparathyroidism of renal origin: Secondary | ICD-10-CM | POA: Diagnosis not present

## 2012-10-08 DIAGNOSIS — Z992 Dependence on renal dialysis: Secondary | ICD-10-CM | POA: Diagnosis not present

## 2012-10-08 DIAGNOSIS — D509 Iron deficiency anemia, unspecified: Secondary | ICD-10-CM | POA: Diagnosis not present

## 2012-10-08 DIAGNOSIS — N186 End stage renal disease: Secondary | ICD-10-CM | POA: Diagnosis not present

## 2012-10-08 DIAGNOSIS — D631 Anemia in chronic kidney disease: Secondary | ICD-10-CM | POA: Diagnosis not present

## 2012-10-11 DIAGNOSIS — N2581 Secondary hyperparathyroidism of renal origin: Secondary | ICD-10-CM | POA: Diagnosis not present

## 2012-10-11 DIAGNOSIS — D631 Anemia in chronic kidney disease: Secondary | ICD-10-CM | POA: Diagnosis not present

## 2012-10-11 DIAGNOSIS — N039 Chronic nephritic syndrome with unspecified morphologic changes: Secondary | ICD-10-CM | POA: Diagnosis not present

## 2012-10-11 DIAGNOSIS — D509 Iron deficiency anemia, unspecified: Secondary | ICD-10-CM | POA: Diagnosis not present

## 2012-10-11 DIAGNOSIS — N186 End stage renal disease: Secondary | ICD-10-CM | POA: Diagnosis not present

## 2012-10-11 DIAGNOSIS — Z992 Dependence on renal dialysis: Secondary | ICD-10-CM | POA: Diagnosis not present

## 2012-10-13 DIAGNOSIS — D509 Iron deficiency anemia, unspecified: Secondary | ICD-10-CM | POA: Diagnosis not present

## 2012-10-13 DIAGNOSIS — Z992 Dependence on renal dialysis: Secondary | ICD-10-CM | POA: Diagnosis not present

## 2012-10-13 DIAGNOSIS — N2581 Secondary hyperparathyroidism of renal origin: Secondary | ICD-10-CM | POA: Diagnosis not present

## 2012-10-13 DIAGNOSIS — N186 End stage renal disease: Secondary | ICD-10-CM | POA: Diagnosis not present

## 2012-10-13 DIAGNOSIS — D631 Anemia in chronic kidney disease: Secondary | ICD-10-CM | POA: Diagnosis not present

## 2012-10-15 DIAGNOSIS — N186 End stage renal disease: Secondary | ICD-10-CM | POA: Diagnosis not present

## 2012-10-15 DIAGNOSIS — Z992 Dependence on renal dialysis: Secondary | ICD-10-CM | POA: Diagnosis not present

## 2012-10-15 DIAGNOSIS — R509 Fever, unspecified: Secondary | ICD-10-CM | POA: Diagnosis not present

## 2012-10-15 DIAGNOSIS — D631 Anemia in chronic kidney disease: Secondary | ICD-10-CM | POA: Diagnosis not present

## 2012-10-15 DIAGNOSIS — N039 Chronic nephritic syndrome with unspecified morphologic changes: Secondary | ICD-10-CM | POA: Diagnosis not present

## 2012-10-15 DIAGNOSIS — D509 Iron deficiency anemia, unspecified: Secondary | ICD-10-CM | POA: Diagnosis not present

## 2012-10-15 DIAGNOSIS — N2581 Secondary hyperparathyroidism of renal origin: Secondary | ICD-10-CM | POA: Diagnosis not present

## 2012-10-18 DIAGNOSIS — N2581 Secondary hyperparathyroidism of renal origin: Secondary | ICD-10-CM | POA: Diagnosis not present

## 2012-10-18 DIAGNOSIS — N186 End stage renal disease: Secondary | ICD-10-CM | POA: Diagnosis not present

## 2012-10-18 DIAGNOSIS — Z992 Dependence on renal dialysis: Secondary | ICD-10-CM | POA: Diagnosis not present

## 2012-10-18 DIAGNOSIS — R509 Fever, unspecified: Secondary | ICD-10-CM | POA: Diagnosis not present

## 2012-10-18 DIAGNOSIS — D631 Anemia in chronic kidney disease: Secondary | ICD-10-CM | POA: Diagnosis not present

## 2012-10-18 DIAGNOSIS — D509 Iron deficiency anemia, unspecified: Secondary | ICD-10-CM | POA: Diagnosis not present

## 2012-10-20 DIAGNOSIS — D509 Iron deficiency anemia, unspecified: Secondary | ICD-10-CM | POA: Diagnosis not present

## 2012-10-20 DIAGNOSIS — R509 Fever, unspecified: Secondary | ICD-10-CM | POA: Diagnosis not present

## 2012-10-20 DIAGNOSIS — N2581 Secondary hyperparathyroidism of renal origin: Secondary | ICD-10-CM | POA: Diagnosis not present

## 2012-10-20 DIAGNOSIS — N186 End stage renal disease: Secondary | ICD-10-CM | POA: Diagnosis not present

## 2012-10-20 DIAGNOSIS — D631 Anemia in chronic kidney disease: Secondary | ICD-10-CM | POA: Diagnosis not present

## 2012-10-20 DIAGNOSIS — N039 Chronic nephritic syndrome with unspecified morphologic changes: Secondary | ICD-10-CM | POA: Diagnosis not present

## 2012-10-20 DIAGNOSIS — Z992 Dependence on renal dialysis: Secondary | ICD-10-CM | POA: Diagnosis not present

## 2012-10-22 DIAGNOSIS — Z992 Dependence on renal dialysis: Secondary | ICD-10-CM | POA: Diagnosis not present

## 2012-10-22 DIAGNOSIS — D631 Anemia in chronic kidney disease: Secondary | ICD-10-CM | POA: Diagnosis not present

## 2012-10-22 DIAGNOSIS — N2581 Secondary hyperparathyroidism of renal origin: Secondary | ICD-10-CM | POA: Diagnosis not present

## 2012-10-22 DIAGNOSIS — N186 End stage renal disease: Secondary | ICD-10-CM | POA: Diagnosis not present

## 2012-10-22 DIAGNOSIS — R509 Fever, unspecified: Secondary | ICD-10-CM | POA: Diagnosis not present

## 2012-10-22 DIAGNOSIS — D509 Iron deficiency anemia, unspecified: Secondary | ICD-10-CM | POA: Diagnosis not present

## 2012-10-25 DIAGNOSIS — N186 End stage renal disease: Secondary | ICD-10-CM | POA: Diagnosis not present

## 2012-10-25 DIAGNOSIS — D631 Anemia in chronic kidney disease: Secondary | ICD-10-CM | POA: Diagnosis not present

## 2012-10-25 DIAGNOSIS — Z992 Dependence on renal dialysis: Secondary | ICD-10-CM | POA: Diagnosis not present

## 2012-10-25 DIAGNOSIS — N2581 Secondary hyperparathyroidism of renal origin: Secondary | ICD-10-CM | POA: Diagnosis not present

## 2012-10-25 DIAGNOSIS — R509 Fever, unspecified: Secondary | ICD-10-CM | POA: Diagnosis not present

## 2012-10-25 DIAGNOSIS — D509 Iron deficiency anemia, unspecified: Secondary | ICD-10-CM | POA: Diagnosis not present

## 2012-10-27 DIAGNOSIS — D631 Anemia in chronic kidney disease: Secondary | ICD-10-CM | POA: Diagnosis not present

## 2012-10-27 DIAGNOSIS — N186 End stage renal disease: Secondary | ICD-10-CM | POA: Diagnosis not present

## 2012-10-27 DIAGNOSIS — D509 Iron deficiency anemia, unspecified: Secondary | ICD-10-CM | POA: Diagnosis not present

## 2012-10-27 DIAGNOSIS — N2581 Secondary hyperparathyroidism of renal origin: Secondary | ICD-10-CM | POA: Diagnosis not present

## 2012-10-27 DIAGNOSIS — R509 Fever, unspecified: Secondary | ICD-10-CM | POA: Diagnosis not present

## 2012-10-27 DIAGNOSIS — Z992 Dependence on renal dialysis: Secondary | ICD-10-CM | POA: Diagnosis not present

## 2012-10-29 DIAGNOSIS — R509 Fever, unspecified: Secondary | ICD-10-CM | POA: Diagnosis not present

## 2012-10-29 DIAGNOSIS — Z992 Dependence on renal dialysis: Secondary | ICD-10-CM | POA: Diagnosis not present

## 2012-10-29 DIAGNOSIS — D509 Iron deficiency anemia, unspecified: Secondary | ICD-10-CM | POA: Diagnosis not present

## 2012-10-29 DIAGNOSIS — N2581 Secondary hyperparathyroidism of renal origin: Secondary | ICD-10-CM | POA: Diagnosis not present

## 2012-10-29 DIAGNOSIS — N186 End stage renal disease: Secondary | ICD-10-CM | POA: Diagnosis not present

## 2012-10-29 DIAGNOSIS — D631 Anemia in chronic kidney disease: Secondary | ICD-10-CM | POA: Diagnosis not present

## 2012-11-01 DIAGNOSIS — N039 Chronic nephritic syndrome with unspecified morphologic changes: Secondary | ICD-10-CM | POA: Diagnosis not present

## 2012-11-01 DIAGNOSIS — Z992 Dependence on renal dialysis: Secondary | ICD-10-CM | POA: Diagnosis not present

## 2012-11-01 DIAGNOSIS — D509 Iron deficiency anemia, unspecified: Secondary | ICD-10-CM | POA: Diagnosis not present

## 2012-11-01 DIAGNOSIS — N2581 Secondary hyperparathyroidism of renal origin: Secondary | ICD-10-CM | POA: Diagnosis not present

## 2012-11-01 DIAGNOSIS — N186 End stage renal disease: Secondary | ICD-10-CM | POA: Diagnosis not present

## 2012-11-01 DIAGNOSIS — D631 Anemia in chronic kidney disease: Secondary | ICD-10-CM | POA: Diagnosis not present

## 2012-11-01 DIAGNOSIS — R509 Fever, unspecified: Secondary | ICD-10-CM | POA: Diagnosis not present

## 2012-11-03 DIAGNOSIS — N186 End stage renal disease: Secondary | ICD-10-CM | POA: Diagnosis not present

## 2012-11-03 DIAGNOSIS — D509 Iron deficiency anemia, unspecified: Secondary | ICD-10-CM | POA: Diagnosis not present

## 2012-11-03 DIAGNOSIS — N039 Chronic nephritic syndrome with unspecified morphologic changes: Secondary | ICD-10-CM | POA: Diagnosis not present

## 2012-11-03 DIAGNOSIS — R509 Fever, unspecified: Secondary | ICD-10-CM | POA: Diagnosis not present

## 2012-11-03 DIAGNOSIS — D631 Anemia in chronic kidney disease: Secondary | ICD-10-CM | POA: Diagnosis not present

## 2012-11-03 DIAGNOSIS — Z992 Dependence on renal dialysis: Secondary | ICD-10-CM | POA: Diagnosis not present

## 2012-11-03 DIAGNOSIS — N2581 Secondary hyperparathyroidism of renal origin: Secondary | ICD-10-CM | POA: Diagnosis not present

## 2012-11-05 DIAGNOSIS — N2581 Secondary hyperparathyroidism of renal origin: Secondary | ICD-10-CM | POA: Diagnosis not present

## 2012-11-05 DIAGNOSIS — R509 Fever, unspecified: Secondary | ICD-10-CM | POA: Diagnosis not present

## 2012-11-05 DIAGNOSIS — N186 End stage renal disease: Secondary | ICD-10-CM | POA: Diagnosis not present

## 2012-11-05 DIAGNOSIS — D631 Anemia in chronic kidney disease: Secondary | ICD-10-CM | POA: Diagnosis not present

## 2012-11-05 DIAGNOSIS — D509 Iron deficiency anemia, unspecified: Secondary | ICD-10-CM | POA: Diagnosis not present

## 2012-11-05 DIAGNOSIS — Z992 Dependence on renal dialysis: Secondary | ICD-10-CM | POA: Diagnosis not present

## 2012-11-06 ENCOUNTER — Emergency Department (HOSPITAL_COMMUNITY): Payer: Medicare Other

## 2012-11-06 ENCOUNTER — Encounter (HOSPITAL_COMMUNITY): Payer: Self-pay | Admitting: Emergency Medicine

## 2012-11-06 ENCOUNTER — Inpatient Hospital Stay (HOSPITAL_COMMUNITY)
Admission: EM | Admit: 2012-11-06 | Discharge: 2012-11-09 | DRG: 444 | Disposition: A | Discharge status: Home / Self Care | Payer: Medicare Other | Attending: Family Medicine | Admitting: Family Medicine

## 2012-11-06 DIAGNOSIS — N1330 Unspecified hydronephrosis: Secondary | ICD-10-CM | POA: Diagnosis not present

## 2012-11-06 DIAGNOSIS — R634 Abnormal weight loss: Secondary | ICD-10-CM | POA: Diagnosis present

## 2012-11-06 DIAGNOSIS — K810 Acute cholecystitis: Secondary | ICD-10-CM | POA: Diagnosis not present

## 2012-11-06 DIAGNOSIS — N2581 Secondary hyperparathyroidism of renal origin: Secondary | ICD-10-CM | POA: Diagnosis present

## 2012-11-06 DIAGNOSIS — G609 Hereditary and idiopathic neuropathy, unspecified: Secondary | ICD-10-CM | POA: Diagnosis present

## 2012-11-06 DIAGNOSIS — Z981 Arthrodesis status: Secondary | ICD-10-CM

## 2012-11-06 DIAGNOSIS — N186 End stage renal disease: Secondary | ICD-10-CM | POA: Diagnosis present

## 2012-11-06 DIAGNOSIS — I120 Hypertensive chronic kidney disease with stage 5 chronic kidney disease or end stage renal disease: Secondary | ICD-10-CM | POA: Diagnosis present

## 2012-11-06 DIAGNOSIS — D631 Anemia in chronic kidney disease: Secondary | ICD-10-CM | POA: Diagnosis present

## 2012-11-06 DIAGNOSIS — D696 Thrombocytopenia, unspecified: Secondary | ICD-10-CM | POA: Diagnosis not present

## 2012-11-06 DIAGNOSIS — E785 Hyperlipidemia, unspecified: Secondary | ICD-10-CM | POA: Diagnosis present

## 2012-11-06 DIAGNOSIS — I10 Essential (primary) hypertension: Secondary | ICD-10-CM

## 2012-11-06 DIAGNOSIS — Z87891 Personal history of nicotine dependence: Secondary | ICD-10-CM

## 2012-11-06 DIAGNOSIS — F411 Generalized anxiety disorder: Secondary | ICD-10-CM | POA: Diagnosis present

## 2012-11-06 DIAGNOSIS — K219 Gastro-esophageal reflux disease without esophagitis: Secondary | ICD-10-CM | POA: Diagnosis present

## 2012-11-06 DIAGNOSIS — F329 Major depressive disorder, single episode, unspecified: Secondary | ICD-10-CM | POA: Diagnosis present

## 2012-11-06 DIAGNOSIS — K828 Other specified diseases of gallbladder: Secondary | ICD-10-CM | POA: Diagnosis not present

## 2012-11-06 DIAGNOSIS — M109 Gout, unspecified: Secondary | ICD-10-CM | POA: Diagnosis present

## 2012-11-06 DIAGNOSIS — R9389 Abnormal findings on diagnostic imaging of other specified body structures: Secondary | ICD-10-CM

## 2012-11-06 DIAGNOSIS — Z79899 Other long term (current) drug therapy: Secondary | ICD-10-CM

## 2012-11-06 DIAGNOSIS — D649 Anemia, unspecified: Secondary | ICD-10-CM

## 2012-11-06 DIAGNOSIS — R918 Other nonspecific abnormal finding of lung field: Secondary | ICD-10-CM | POA: Diagnosis not present

## 2012-11-06 DIAGNOSIS — K819 Cholecystitis, unspecified: Secondary | ICD-10-CM | POA: Diagnosis not present

## 2012-11-06 DIAGNOSIS — IMO0002 Reserved for concepts with insufficient information to code with codable children: Secondary | ICD-10-CM

## 2012-11-06 DIAGNOSIS — Z992 Dependence on renal dialysis: Secondary | ICD-10-CM

## 2012-11-06 DIAGNOSIS — F3289 Other specified depressive episodes: Secondary | ICD-10-CM | POA: Diagnosis present

## 2012-11-06 DIAGNOSIS — R0600 Dyspnea, unspecified: Secondary | ICD-10-CM

## 2012-11-06 LAB — CBC WITH DIFFERENTIAL/PLATELET
Hemoglobin: 11.6 g/dL — ABNORMAL LOW (ref 12.0–15.0)
Lymphocytes Relative: 17 % (ref 12–46)
Lymphs Abs: 1.6 10*3/uL (ref 0.7–4.0)
MCH: 30.7 pg (ref 26.0–34.0)
Monocytes Relative: 11 % (ref 3–12)
Neutro Abs: 6.1 10*3/uL (ref 1.7–7.7)
Neutrophils Relative %: 64 % (ref 43–77)
RBC: 3.78 MIL/uL — ABNORMAL LOW (ref 3.87–5.11)
WBC: 9.6 10*3/uL (ref 4.0–10.5)

## 2012-11-06 LAB — COMPREHENSIVE METABOLIC PANEL
ALT: 35 U/L (ref 0–35)
Alkaline Phosphatase: 103 U/L (ref 39–117)
BUN: 31 mg/dL — ABNORMAL HIGH (ref 6–23)
CO2: 26 mEq/L (ref 19–32)
Chloride: 95 mEq/L — ABNORMAL LOW (ref 96–112)
GFR calc Af Amer: 6 mL/min — ABNORMAL LOW (ref >90)
Glucose, Bld: 108 mg/dL — ABNORMAL HIGH (ref 70–99)
Potassium: 3.9 mEq/L (ref 3.5–5.1)
Total Bilirubin: 0.4 mg/dL (ref 0.3–1.2)

## 2012-11-06 LAB — LACTIC ACID, PLASMA: Lactic Acid, Venous: 1.9 mmol/L (ref 0.5–2.2)

## 2012-11-06 NOTE — ED Provider Notes (Signed)
History     CSN: [id number]  Arrival date & time 11/06/12  2044   First MD Initiated Contact with Patient 11/06/12 2107      Chief Complaint  Patient presents with  . Fever   HPI: [name] is a 61 yo AAF with history of ESRD on HD T/R/S, depression, GERD, HTN, presents with abdominal pain, diarrhea, fever and nausea. Symptoms started 4-5 days ago with diarrhea that she describes as mucus. Friday evening she developed fever to 103 at home and chills. She did not take any medication at that time. She went to dialysis yesterday, she was again febrile to 103, given Vanc and another antibiotic but she is unsure of the name. She was sent home. Friday she also began to experience nausea and decreased appetite, bloating and burping. These symptoms have persisted and she has been unable to eat but continued to drink fluids. This morning she was again febrile to 101, but has been taking Tylenol. This morning her abdomen became more distended with pain located in the RUQ. She describes the pain as squeezing, non-radiating, moderate, no alleviating or exacerbating factors, constant. She presented due to worsening pain.   Past Medical History  Diagnosis Date  . Anemia   . Anxiety   . Blood transfusion   . Depression   . GERD (gastroesophageal reflux disease)   . Hyperlipidemia   . Hypertension   . Chronic kidney disease     transplant 1996  . Neuromuscular disorder     neuropathy hand and legs  . Weight loss, unintentional   . Adenomatous polyp of colon 10/2010  . Diverticula, colon   . Heart murmur     Past Surgical History  Procedure Date  . Kidney transplant 1996  . Upper gastrointestinal endoscopy   . Back surgery   . Cervical fusion   . Av fistula placement     for dialysis  . Colonoscopy   . Abdominal hysterectomy     Family History  Problem Relation Age of Onset  . Colon cancer Brother   . Coronary artery disease Mother 2    History  Substance Use Topics  . Smoking  status: Former Smoker    Types: Cigarettes    Quit date: 12/31/1991  . Smokeless tobacco: Never Used  . Alcohol Use: No    OB History    Grav Para Term Preterm Abortions TAB SAB Ect Mult Living                  Review of Systems  Constitutional: Positive for fever, chills and appetite change (decreased ). Negative for diaphoresis.  HENT: Negative for congestion and rhinorrhea.   Eyes: Negative for photophobia and visual disturbance.  Respiratory: Negative for cough, chest tightness and shortness of breath.   Cardiovascular: Negative for chest pain and palpitations.  Gastrointestinal: Positive for nausea, abdominal pain and abdominal distention. Negative for vomiting, constipation and blood in stool.  Genitourinary: Negative for dysuria, frequency and decreased urine volume.  Musculoskeletal: Negative for myalgias and arthralgias.  Skin: Negative for pallor and rash.  Neurological: Positive for headaches (associated with fever). Negative for dizziness, syncope and light-headedness.  Psychiatric/Behavioral: Negative for confusion and agitation.  All other systems reviewed and are negative.   Allergies  Sulfa antibiotics  Home Medications   Current Outpatient Rx  Name  Route  Sig  Dispense  Refill  . ALLOPURINOL 300 MG PO TABS   Oral   Take 150 mg by mouth daily.          Marland Kitchen  NEPHRO-VITE 0.8 MG PO TABS   Oral   Take 0.8 mg by mouth daily.          Marland Kitchen DOXAZOSIN MESYLATE 8 MG PO TABS   Oral   Take 8 mg by mouth 2 (two) times daily.           Marland Kitchen FAMOTIDINE 20 MG PO TABS   Oral   Take 20 mg by mouth daily.           Marland Kitchen HYDROCODONE-ACETAMINOPHEN 5-500 MG PO TABS   Oral   Take 1 tablet by mouth every 6 (six) hours as needed. Back pain          . LABETALOL HCL 300 MG PO TABS   Oral   Take 600 mg by mouth 2 (two) times daily.           Marland Kitchen PRAVASTATIN SODIUM 20 MG PO TABS   Oral   Take 20 mg by mouth daily.           Marland Kitchen PREDNISONE 5 MG PO TABS   Oral   Take  5 mg by mouth daily.           Marland Kitchen PROMETHAZINE HCL 25 MG PO TABS   Oral   Take 25 mg by mouth every 6 (six) hours as needed. nausea          . ZOLPIDEM TARTRATE 5 MG PO TABS   Oral   Take 5 mg by mouth at bedtime as needed. For insomnia           BP 119/69  Pulse 103  Temp 98.3 F (36.8 C) (Oral)  Resp 16  SpO2 100%  Physical Exam  Nursing note and vitals reviewed. Constitutional: She is oriented to person, place, and time. She appears well-developed and well-nourished. She is cooperative. No distress.  HENT:  Head: Normocephalic and atraumatic.  Mouth/Throat: Mucous membranes are dry.  Eyes: Conjunctivae normal and EOM are normal. Pupils are equal, round, and reactive to light.  Neck: No JVD present.  Cardiovascular: Regular rhythm.  Tachycardia present.  Exam reveals no decreased pulses.   Murmur heard.  Systolic murmur is present with a grade of 2/6  Pulmonary/Chest: Effort normal and breath sounds normal. She has no decreased breath sounds.  Abdominal: Soft. Normal appearance. There is tenderness in the right upper quadrant. There is guarding (voluntary) and positive Murphy's sign. There is no rigidity.  Musculoskeletal: Normal range of motion.  Neurological: She is alert and oriented to person, place, and time.  Skin: Skin is warm and dry.    ED Course  Procedures  Labs Reviewed  CBC WITH DIFFERENTIAL - Abnormal; Notable for the following:    RBC 3.78 (*)     Hemoglobin 11.6 (*)     HCT 35.7 (*)     RDW 17.2 (*)     Platelets 84 (*)  PLATELET COUNT CONFIRMED BY SMEAR   Monocytes Absolute 1.1 (*)     Eosinophils Relative 6 (*)     Basophils Relative 2 (*)     Basophils Absolute 0.2 (*)  CORRECTED ON 11/24 AT 2254: PREVIOUSLY REPORTED AS 0.1   All other components within normal limits  COMPREHENSIVE METABOLIC PANEL - Abnormal; Notable for the following:    Chloride 95 (*)     Glucose, Bld 108 (*)     BUN 31 (*)     Creatinine, Ser 7.32 (*)     Albumin  2.8 (*)     AST  66 (*)     GFR calc non Af Amer 5 (*)     GFR calc Af Amer 6 (*)     All other components within normal limits  LACTIC ACID, PLASMA   US Abdomen Complete  11/07/2012  *RADIOLOGY REPORT*  Clinical Data:  Abdominal pain and fever.  ABDOMINAL ULTRASOUND COMPLETE  Comparison:  Renal ultrasound performed 03/04/2008, and MRI of the pelvis performed 08/19/2012  Findings:  Gallbladder: The gallbladder wall is thickened and edematous, measuring up to 1.0 cm in thickness.  A 5 mm polyp is noted near the base of the gallbladder.  No significant pericholecystic fluid is seen; a positive ultrasonographic Murphy's sign is elicited.  Common Bile Duct:  0.6 cm in diameter; within normal limits in caliber.  Liver:  Normal parenchymal echogenicity and echotexture; no focal lesions identified.  Limited Doppler evaluation demonstrates normal blood flow within the liver.  IVC:  Unremarkable in appearance.  Pancreas:  Although the pancreas is difficult to visualize in its entirety due to overlying bowel gas, no focal pancreatic abnormality is identified.  Spleen:  12.9 cm in length; measures 348 mL in volume, within normal limits, with scattered calcified granulomata.  Right kidney:  5.8 cm in length; diffusely atrophic, with scattered small cysts.  Left kidney:  6.7 cm in length; diffusely atrophic, with scattered small cysts.  Abdominal Aorta:  Normal in caliber; no aneurysm identified. Plaque is noted along the course of the abdominal aorta.  Trace ascites is noted.  A small right pleural effusion is seen.  The patient's transplant kidney, noted within the pelvis, measures 10.1 cm in length, and demonstrates very mild hydronephrosis.  IMPRESSION:  1.  Diffusely thickened and edematous gallbladder wall, measuring up to 1.0 cm in thickness, with a positive ultrasonographic Murphy's sign; this raises concern for cholecystitis.  No definite stones identified on ultrasound; this could conceivably reflect acalculous  cholecystitis.  Suggest clinical correlation. 2.  Apparent 5 mm polyp noted at the base of the gallbladder. 3.  Diffusely atrophic native kidneys, reflecting the patient's chronic renal failure. 4.  Very mild hydronephrosis noted with respect to the patient's transplant kidney in the pelvis.   Original Report Authenticated By: Santa Lighter, M.D.    Dg Chest Port 1 View  11/06/2012  *RADIOLOGY REPORT*  Clinical Data: Fever, shortness of breath and chest pain.  PORTABLE CHEST - 1 VIEW  Comparison: Chest x-ray 11/15/2009.  Findings: Interval development of the interstitial prominence and ill-defined air space disease in the right base.  This does not obscure either the right hemidiaphragm or the right heart border. Fine pattern of peripheral micronodularity in the upper lobes of the lungs bilaterally are unchanged compared to prior examinations. No definite pleural effusions.  Pulmonary vasculature is normal. Heart size is mildly enlarged.  Mediastinal contours are unremarkable.  Atherosclerosis of the thoracic aorta.  Old healed fracture of the posterolateral aspect of the left seventh rib is again noted.  Orthopedic fixation hardware in the mid cervical spine incompletely visualized.  IMPRESSION: 1.  New ill-defined interstitial and airspace opacity in the right base, favored to be in the right lower lobe, concerning for bronchopneumonia. 2.  Radiographic appearance of the chest is otherwise essentially unchanged compared to prior examinations, as above.   Original Report Authenticated By: Vinnie Langton, M.D.    No diagnosis found.  MDM  61 yo AAF with history of ESRD on HD T/R/S, depression, GERD, HTN, presents with abdominal pain, diarrhea, fever and nausea. Febrile to  100.7, tachycardic to 103, normotensive. SIRS but no source of infection so code sepsis not called. History and exam concerning for cholecystitis. Less likely PNA. Doubt urinary source of fever as she does not make urine. Labs: lactic  acid 1.9, WBC 9.6, Hgb 11.6 (elevated from baseline c/w volume depletion), normal lytes, Cr and BUN c/w with ESRD. CXR with ill defined consolidation in RLL, doubt PNA as no significant cough. Due to patient's isolated tenderness in RUQ, obtained US which was positive for cholecystitis. Discussed case with general surgery who will plan for cholecystectomy at some point but will await medicine clearance first. Advised starting Zosyn which was done. Admitted to Hospitalist service in stable condition. Discussed w/u with patient and her family members, answered all questions. She remained HD stable while in the ED. Patient offered pain medication but she refused on two occassions.   Reviewed imaging, labs and previous medical records, utilized in MDM  Discussed case with Dr. Maryan Rued  Clinical Impression 1. Acute cholecystitis.  2. Chronic anemia 3. Thrombocytopenia  4. ESRD       Louretta Shorten, MD 11/07/12 [phone]

## 2012-11-06 NOTE — ED Notes (Addendum)
Pt reports having fevers of 103+ since Friday, last took tylenol at around 5pm; pt is HD pt and went to HD on sat. Morning; was still running fever there so staff drew blood cx and gave tylenol, and gave vancomycin through access at HD; pt reports having diarrhea and abd distention; BC drawn at [facility] dialysis center at [facility]

## 2012-11-07 ENCOUNTER — Encounter (HOSPITAL_COMMUNITY)
Admission: EM | Disposition: A | Discharge status: Home / Self Care | Payer: Self-pay | Source: Home / Self Care | Attending: Internal Medicine

## 2012-11-07 ENCOUNTER — Encounter (HOSPITAL_COMMUNITY): Payer: Self-pay | Admitting: General Surgery

## 2012-11-07 DIAGNOSIS — N186 End stage renal disease: Secondary | ICD-10-CM | POA: Diagnosis present

## 2012-11-07 DIAGNOSIS — D631 Anemia in chronic kidney disease: Secondary | ICD-10-CM | POA: Diagnosis not present

## 2012-11-07 DIAGNOSIS — IMO0002 Reserved for concepts with insufficient information to code with codable children: Secondary | ICD-10-CM | POA: Diagnosis not present

## 2012-11-07 DIAGNOSIS — G609 Hereditary and idiopathic neuropathy, unspecified: Secondary | ICD-10-CM | POA: Diagnosis present

## 2012-11-07 DIAGNOSIS — D649 Anemia, unspecified: Secondary | ICD-10-CM | POA: Diagnosis not present

## 2012-11-07 DIAGNOSIS — N039 Chronic nephritic syndrome with unspecified morphologic changes: Secondary | ICD-10-CM | POA: Diagnosis not present

## 2012-11-07 DIAGNOSIS — K819 Cholecystitis, unspecified: Secondary | ICD-10-CM | POA: Diagnosis not present

## 2012-11-07 DIAGNOSIS — I120 Hypertensive chronic kidney disease with stage 5 chronic kidney disease or end stage renal disease: Secondary | ICD-10-CM | POA: Diagnosis not present

## 2012-11-07 DIAGNOSIS — R918 Other nonspecific abnormal finding of lung field: Secondary | ICD-10-CM | POA: Diagnosis not present

## 2012-11-07 DIAGNOSIS — Z981 Arthrodesis status: Secondary | ICD-10-CM | POA: Diagnosis not present

## 2012-11-07 DIAGNOSIS — Z79899 Other long term (current) drug therapy: Secondary | ICD-10-CM | POA: Diagnosis not present

## 2012-11-07 DIAGNOSIS — R1011 Right upper quadrant pain: Secondary | ICD-10-CM | POA: Diagnosis not present

## 2012-11-07 DIAGNOSIS — F411 Generalized anxiety disorder: Secondary | ICD-10-CM | POA: Diagnosis present

## 2012-11-07 DIAGNOSIS — N189 Chronic kidney disease, unspecified: Secondary | ICD-10-CM | POA: Diagnosis present

## 2012-11-07 DIAGNOSIS — D696 Thrombocytopenia, unspecified: Secondary | ICD-10-CM | POA: Diagnosis not present

## 2012-11-07 DIAGNOSIS — K219 Gastro-esophageal reflux disease without esophagitis: Secondary | ICD-10-CM | POA: Diagnosis present

## 2012-11-07 DIAGNOSIS — Z992 Dependence on renal dialysis: Secondary | ICD-10-CM | POA: Diagnosis present

## 2012-11-07 DIAGNOSIS — K810 Acute cholecystitis: Secondary | ICD-10-CM | POA: Diagnosis present

## 2012-11-07 DIAGNOSIS — R634 Abnormal weight loss: Secondary | ICD-10-CM | POA: Diagnosis present

## 2012-11-07 DIAGNOSIS — E785 Hyperlipidemia, unspecified: Secondary | ICD-10-CM | POA: Diagnosis present

## 2012-11-07 DIAGNOSIS — Z87891 Personal history of nicotine dependence: Secondary | ICD-10-CM | POA: Diagnosis not present

## 2012-11-07 DIAGNOSIS — N2581 Secondary hyperparathyroidism of renal origin: Secondary | ICD-10-CM | POA: Diagnosis present

## 2012-11-07 DIAGNOSIS — M109 Gout, unspecified: Secondary | ICD-10-CM | POA: Diagnosis present

## 2012-11-07 DIAGNOSIS — F3289 Other specified depressive episodes: Secondary | ICD-10-CM | POA: Diagnosis present

## 2012-11-07 DIAGNOSIS — I10 Essential (primary) hypertension: Secondary | ICD-10-CM | POA: Diagnosis present

## 2012-11-07 HISTORY — DX: Anemia, unspecified: D64.9

## 2012-11-07 HISTORY — DX: End stage renal disease: N18.6

## 2012-11-07 LAB — COMPREHENSIVE METABOLIC PANEL
CO2: 24 mEq/L (ref 19–32)
Calcium: 9.4 mg/dL (ref 8.4–10.5)
Creatinine, Ser: 8.4 mg/dL — ABNORMAL HIGH (ref 0.50–1.10)
GFR calc Af Amer: 5 mL/min — ABNORMAL LOW (ref >90)
GFR calc non Af Amer: 5 mL/min — ABNORMAL LOW (ref >90)
Glucose, Bld: 102 mg/dL — ABNORMAL HIGH (ref 70–99)
Total Protein: 6.7 g/dL (ref 6.0–8.3)

## 2012-11-07 LAB — MRSA PCR SCREENING: MRSA by PCR: NEGATIVE

## 2012-11-07 LAB — CBC
HCT: 33.2 % — ABNORMAL LOW (ref 36.0–46.0)
Hemoglobin: 10.8 g/dL — ABNORMAL LOW (ref 12.0–15.0)
RBC: 3.58 MIL/uL — ABNORMAL LOW (ref 3.87–5.11)
WBC: 9.7 10*3/uL (ref 4.0–10.5)

## 2012-11-07 LAB — TYPE AND SCREEN
ABO/RH(D): O POS
Antibody Screen: NEGATIVE

## 2012-11-07 LAB — PROTIME-INR
INR: 1.08 (ref 0.00–1.49)
Prothrombin Time: 13.9 seconds (ref 11.6–15.2)

## 2012-11-07 LAB — APTT: aPTT: 47 seconds — ABNORMAL HIGH (ref 24–37)

## 2012-11-07 SURGERY — LAPAROSCOPIC CHOLECYSTECTOMY WITH INTRAOPERATIVE CHOLANGIOGRAM
Anesthesia: General

## 2012-11-07 MED ORDER — ACETAMINOPHEN 325 MG PO TABS: 650.0000 mg | ORAL_TABLET | Freq: Four times a day (QID) | ORAL | Status: DC | PRN

## 2012-11-07 MED ORDER — DARBEPOETIN ALFA-POLYSORBATE 25 MCG/0.42ML IJ SOLN
25.0000 ug | Freq: Once | INTRAMUSCULAR | Status: AC
  Administered 2012-11-08: 25 ug via INTRAVENOUS
  Filled 2012-11-07: qty 0.42

## 2012-11-07 MED ORDER — ONDANSETRON HCL 4 MG/2ML IJ SOLN: 4.0000 mg | Freq: Four times a day (QID) | INTRAMUSCULAR | Status: DC | PRN

## 2012-11-07 MED ORDER — ONDANSETRON HCL 4 MG PO TABS
4.0000 mg | ORAL_TABLET | Freq: Four times a day (QID) | ORAL | Status: DC | PRN
  Administered 2012-11-09: 4 mg via ORAL
  Filled 2012-11-07: qty 1

## 2012-11-07 MED ORDER — ZOLPIDEM TARTRATE 5 MG PO TABS
5.0000 mg | ORAL_TABLET | Freq: Every evening | ORAL | Status: DC | PRN
  Administered 2012-11-07 – 2012-11-08 (×2): 5 mg via ORAL
  Filled 2012-11-07 (×2): qty 1

## 2012-11-07 MED ORDER — PIPERACILLIN-TAZOBACTAM IN DEX 2-0.25 GM/50ML IV SOLN
2.2500 g | Freq: Three times a day (TID) | INTRAVENOUS | Status: DC
  Administered 2012-11-07 – 2012-11-09 (×6): 2.25 g via INTRAVENOUS
  Filled 2012-11-07 (×8): qty 50

## 2012-11-07 MED ORDER — PIPERACILLIN-TAZOBACTAM IN DEX 2-0.25 GM/50ML IV SOLN
2.2500 g | INTRAVENOUS | Status: AC
  Administered 2012-11-07: 2.25 g via INTRAVENOUS
  Filled 2012-11-07: qty 50

## 2012-11-07 MED ORDER — PARICALCITOL 5 MCG/ML IV SOLN
9.0000 ug | Freq: Once | INTRAVENOUS | Status: AC
  Administered 2012-11-08: 9 ug via INTRAVENOUS
  Filled 2012-11-07: qty 1.8

## 2012-11-07 MED ORDER — SODIUM CHLORIDE 0.9 % IJ SOLN
3.0000 mL | Freq: Two times a day (BID) | INTRAMUSCULAR | Status: DC
  Administered 2012-11-07 – 2012-11-08 (×5): 3 mL via INTRAVENOUS

## 2012-11-07 MED ORDER — HYDROCORTISONE SOD SUCCINATE 100 MG IJ SOLR
50.0000 mg | Freq: Three times a day (TID) | INTRAMUSCULAR | Status: DC
  Administered 2012-11-07 – 2012-11-08 (×5): 50 mg via INTRAVENOUS
  Filled 2012-11-07 (×8): qty 1

## 2012-11-07 MED ORDER — METOPROLOL TARTRATE 1 MG/ML IV SOLN
5.0000 mg | Freq: Four times a day (QID) | INTRAVENOUS | Status: DC
  Administered 2012-11-07 – 2012-11-08 (×5): 5 mg via INTRAVENOUS
  Filled 2012-11-07 (×9): qty 5

## 2012-11-07 MED ORDER — ACETAMINOPHEN 650 MG RE SUPP: 650.0000 mg | Freq: Four times a day (QID) | RECTAL | Status: DC | PRN

## 2012-11-07 NOTE — ED Notes (Signed)
Internal medicine at bedside

## 2012-11-07 NOTE — ED Notes (Signed)
Contact info:  Kathrynn Ducking (daughter)  (C) [phone]  [phone]

## 2012-11-07 NOTE — Progress Notes (Signed)
Pt has been seen and examined at bedside. Blood work available reviewed and it appears that thrombocytopenia is new finding, on admission Plt 84 --> 50. The etiology is unclear at this point. I will check peripheral smear and LDH for now. Pt did reports mild nasal bleed this AM but no other gross bleeding noted. Plan is to proceed to surgery today. WIll obtain blood work in AM.  Faye Ramsay, MD  [facility] Pager [phone]  If 7PM-7AM, please contact night-coverage [email or web address] Password [email or web address]

## 2012-11-07 NOTE — Progress Notes (Signed)
Pt admitted to unit 6700, room 6739. Pt alert and oriented. Pt denies any pain. Pt had diarrhea episode. Pt placed on telemetry, box 6739. Bed in lowest position. Call bell within reach. Will continue to monitor.

## 2012-11-07 NOTE — Progress Notes (Addendum)
Subjective: Still sore in RUQ. Denies recent CP, SOB, orthopnea, PND; has had clear sputum over the past several days  Objective: Vital signs in last 24 hours: Temp:  [98.3 F (36.8 C)-100.6 F (38.1 C)] 100.6 F (38.1 C) (11/25 0346) Pulse Rate:  [81-103] 99  (11/25 0346) Resp:  [16-20] 20  (11/25 0346) BP: (119-159)/(69-86) 159/86 mmHg (11/25 0346) SpO2:  [95 %-100 %] 96 % (11/25 0346) Weight:  [137 lb 8 oz (62.37 kg)] 137 lb 8 oz (62.37 kg) (11/25 0346) Last BM Date: 11/07/12  Intake/Output from previous day: 11/24 0701 - 11/25 0700 In: -  Out: 1 [Stool:1] Intake/Output this shift:    Alert, nad cta ant Reg Soft, TTP -RUQ with guarding. No RT Thrill over L AV fistula  Lab Results:   [facility] 11/07/12 0647 11/06/12 2113  WBC 9.7 9.6  HGB 10.8* 11.6*  HCT 33.2* 35.7*  PLT 50* 84*   BMET  Basename 11/07/12 0647 11/06/12 2113  NA 134* 137  K 4.1 3.9  CL 92* 95*  CO2 24 26  GLUCOSE 102* 108*  BUN 37* 31*  CREATININE 8.40* 7.32*  CALCIUM 9.4 9.6   PT/INR  Basename 11/07/12 0647  LABPROT 13.9  INR 1.08   ABG No results found for this basename: PHART:2,PCO2:2,PO2:2,HCO3:2 in the last 72 hours  Studies/Results: US Abdomen Complete  11/07/2012  *RADIOLOGY REPORT*  Clinical Data:  Abdominal pain and fever.  ABDOMINAL ULTRASOUND COMPLETE  Comparison:  Renal ultrasound performed 03/04/2008, and MRI of the pelvis performed 08/19/2012  Findings:  Gallbladder: The gallbladder wall is thickened and edematous, measuring up to 1.0 cm in thickness.  A 5 mm polyp is noted near the base of the gallbladder.  No significant pericholecystic fluid is seen; a positive ultrasonographic Murphy's sign is elicited.  Common Bile Duct:  0.6 cm in diameter; within normal limits in caliber.  Liver:  Normal parenchymal echogenicity and echotexture; no focal lesions identified.  Limited Doppler evaluation demonstrates normal blood flow within the liver.  IVC:  Unremarkable in  appearance.  Pancreas:  Although the pancreas is difficult to visualize in its entirety due to overlying bowel gas, no focal pancreatic abnormality is identified.  Spleen:  12.9 cm in length; measures 348 mL in volume, within normal limits, with scattered calcified granulomata.  Right kidney:  5.8 cm in length; diffusely atrophic, with scattered small cysts.  Left kidney:  6.7 cm in length; diffusely atrophic, with scattered small cysts.  Abdominal Aorta:  Normal in caliber; no aneurysm identified. Plaque is noted along the course of the abdominal aorta.  Trace ascites is noted.  A small right pleural effusion is seen.  The patient's transplant kidney, noted within the pelvis, measures 10.1 cm in length, and demonstrates very mild hydronephrosis.  IMPRESSION:  1.  Diffusely thickened and edematous gallbladder wall, measuring up to 1.0 cm in thickness, with a positive ultrasonographic Murphy's sign; this raises concern for cholecystitis.  No definite stones identified on ultrasound; this could conceivably reflect acalculous cholecystitis.  Suggest clinical correlation. 2.  Apparent 5 mm polyp noted at the base of the gallbladder. 3.  Diffusely atrophic native kidneys, reflecting the patient's chronic renal failure. 4.  Very mild hydronephrosis noted with respect to the patient's transplant kidney in the pelvis.   Original Report Authenticated By: Santa Lighter, M.D.    Dg Chest Port 1 View  11/06/2012  *RADIOLOGY REPORT*  Clinical Data: Fever, shortness of breath and chest pain.  PORTABLE CHEST - 1 VIEW  Comparison: Chest x-ray 11/15/2009.  Findings: Interval development of the interstitial prominence and ill-defined air space disease in the right base.  This does not obscure either the right hemidiaphragm or the right heart border. Fine pattern of peripheral micronodularity in the upper lobes of the lungs bilaterally are unchanged compared to prior examinations. No definite pleural effusions.  Pulmonary  vasculature is normal. Heart size is mildly enlarged.  Mediastinal contours are unremarkable.  Atherosclerosis of the thoracic aorta.  Old healed fracture of the posterolateral aspect of the left seventh rib is again noted.  Orthopedic fixation hardware in the mid cervical spine incompletely visualized.  IMPRESSION: 1.  New ill-defined interstitial and airspace opacity in the right base, favored to be in the right lower lobe, concerning for bronchopneumonia. 2.  Radiographic appearance of the chest is otherwise essentially unchanged compared to prior examinations, as above.   Original Report Authenticated By: Vinnie Langton, M.D.     Anti-infectives: Anti-infectives     Start     Dose/Rate Route Frequency Ordered Stop   11/07/12 0800   piperacillin-tazobactam (ZOSYN) IVPB 2.25 g        2.25 g 100 mL/hr over 30 Minutes Intravenous Every 8 hours 11/07/12 0219     11/07/12 0200   piperacillin-tazobactam (ZOSYN) IVPB 2.25 g        2.25 g 100 mL/hr over 30 Minutes Intravenous NOW 11/07/12 0157 11/07/12 0255          Assessment/Plan: ESRD HTN Acalculous cholecystitis - cont NPO, IV abx, possible surgery -see below Thrombocytopenia - question etiology. Plt count down further today. Have asked medicine attending to work-up. Since plt count is going down (plt count in Oct 2012 was 209) may need to treat cholecystitis medically with IV abx and percutaneous cholecystostomy tube. Will discuss with Dr Doyle Askew later today once she has had a chance to review pt's chart.   I believe the patient's symptoms are consistent with gallbladder disease.  I discussed laparoscopic cholecystectomy with IOC in detail.  The patient was shown diagrams detailing the procedure.  We discussed the risks and benefits of a laparoscopic cholecystectomy including, but not limited to bleeding, infection, injury to surrounding structures such as the intestine or liver, bile leak, retained gallstones, need to convert to an open  procedure, prolonged diarrhea, blood clots such as  DVT, common bile duct injury, anesthesia risks, and possible need for additional procedures.  We discussed the typical post-operative recovery course. I explained that the likelihood of improvement of their symptoms is good.   Leighton Ruff. Redmond Pulling, MD, FACS General, Bariatric, & Minimally Invasive Surgery [facility], [place] Pager [phone]   LOS: 1 day    Gayland Curry 11/07/2012

## 2012-11-07 NOTE — H&P (Addendum)
[facility] History and Physical  SAHARRAH West [id number] DOB: March 28, 1951 DOA: 11/06/2012  Referring physician: Dr. Zenia Resides PCP: Estanislado Emms, MD  Specialists:   Chief Complaint: abdominal pain  HPI: [name] is a 61 y.o. female with past medical history of end-stage renal disease on hemodialysis, failed renal transplant, chronic steroid therapy, hypertension. Patient was in her usual state of health when last Wednesday she starts to develop onset of diarrhea, and nausea. The following day she began having high fevers of 103. She went to her dialysis unit on Saturday and was given one dose of vancomycin, as well as another antibiotic. Blood cultures were sent at that time. Patient was discharged home. She began having right upper quadrant pain which is progressively worse. She denies any shortness of breath, chest pain. She does feel that she has a bit of a cough. She was increasingly weak and dizzy. He was evaluated in the emergency room the right upper quadrant ultrasound indicated acute cholecystitis. Patient has been referred for admission.  Review of Systems: Pertinent positives per history of present illness, otherwise  Past Medical History  Diagnosis Date  . Anemia   . Anxiety   . Blood transfusion   . Depression   . GERD (gastroesophageal reflux disease)   . Hyperlipidemia   . Hypertension   . Chronic kidney disease     transplant 1996  . Neuromuscular disorder     neuropathy hand and legs  . Weight loss, unintentional   . Adenomatous polyp of colon 10/2010  . Diverticula, colon   . Heart murmur    Past Surgical History  Procedure Date  . Kidney transplant 1996  . Upper gastrointestinal endoscopy   . Back surgery   . Cervical fusion   . Av fistula placement     for dialysis  . Colonoscopy   . Abdominal hysterectomy    Social History:  reports that she quit smoking about 20 years ago. Her smoking use included Cigarettes. She has never used  smokeless tobacco. She reports that she does not drink alcohol or use illicit drugs. Was independently  Allergies  Allergen Reactions  . Sulfa Antibiotics     Both parents allergic-so will not take    Family History  Problem Relation Age of Onset  . Colon cancer Brother   . Coronary artery disease Mother 59     Prior to Admission medications   Medication Sig Start Date End Date Taking? Authorizing Provider  allopurinol (ZYLOPRIM) 300 MG tablet Take 150 mg by mouth daily.    Yes Historical Provider, MD  b complex-vitamin c-folic acid (NEPHRO-VITE) 0.8 MG TABS Take 0.8 mg by mouth daily.    Yes Historical Provider, MD  doxazosin (CARDURA) 8 MG tablet Take 8 mg by mouth 2 (two) times daily.     Yes Historical Provider, MD  famotidine (PEPCID) 20 MG tablet Take 20 mg by mouth daily.     Yes Historical Provider, MD  HYDROcodone-acetaminophen (VICODIN) 5-500 MG per tablet Take 1 tablet by mouth every 6 (six) hours as needed. Back pain    Yes Historical Provider, MD  labetalol (NORMODYNE) 300 MG tablet Take 600 mg by mouth 2 (two) times daily.     Yes Historical Provider, MD  pravastatin (PRAVACHOL) 20 MG tablet Take 20 mg by mouth daily.     Yes Historical Provider, MD  predniSONE (DELTASONE) 5 MG tablet Take 5 mg by mouth daily.     Yes Historical Provider, MD  promethazine (  PHENERGAN) 25 MG tablet Take 25 mg by mouth every 6 (six) hours as needed. nausea    Yes Historical Provider, MD  zolpidem (AMBIEN) 5 MG tablet Take 5 mg by mouth at bedtime as needed. For insomnia   Yes Historical Provider, MD   Physical Exam: Filed Vitals:   11/06/12 2050 11/06/12 2232  BP: 119/69   Pulse: 103   Temp: 98.3 F (36.8 C) 100.4 F (38 C)  TempSrc: Oral Rectal  Resp: 16   SpO2: 100%      General:  No acute distress, lying in bed  Eyes: Pupils are equal round reactive to light  ENT: No pharyngeal erythema, mucous membranes are dry  Neck: Supple  Cardiovascular: S1, S2, regular rate and  rhythm, no pedal edema  Respiratory: Clear to auscultation bilaterally  Abdomen: Soft, tender in the right upper quadrant, positive Murphy's sign, bowel sounds active  Skin: Normal  Musculoskeletal: Deferred  Psychiatric: Normal affect, cooperative with exam  Neurologic: Grossly intact, nonfocal  Labs on Admission:  Basic Metabolic Panel:  Lab [id number] 2113  NA 137  K 3.9  CL 95*  CO2 26  GLUCOSE 108*  BUN 31*  CREATININE 7.32*  CALCIUM 9.6  MG --  PHOS --   Liver Function Tests:  Lab 11/06/12 2113  AST 66*  ALT 35  ALKPHOS 103  BILITOT 0.4  PROT 6.9  ALBUMIN 2.8*   No results found for this basename: LIPASE:5,AMYLASE:5 in the last 168 hours No results found for this basename: AMMONIA:5 in the last 168 hours CBC:  Lab 11/06/12 2113  WBC 9.6  NEUTROABS 6.1  HGB 11.6*  HCT 35.7*  MCV 94.4  PLT 84*   Cardiac Enzymes: No results found for this basename: CKTOTAL:5,CKMB:5,CKMBINDEX:5,TROPONINI:5 in the last 168 hours  BNP (last 3 results) No results found for this basename: PROBNP:3 in the last 8760 hours CBG: No results found for this basename: GLUCAP:5 in the last 168 hours  Radiological Exams on Admission: US Abdomen Complete  11/07/2012  *RADIOLOGY REPORT*  Clinical Data:  Abdominal pain and fever.  ABDOMINAL ULTRASOUND COMPLETE  Comparison:  Renal ultrasound performed 03/04/2008, and MRI of the pelvis performed 08/19/2012  Findings:  Gallbladder: The gallbladder wall is thickened and edematous, measuring up to 1.0 cm in thickness.  A 5 mm polyp is noted near the base of the gallbladder.  No significant pericholecystic fluid is seen; a positive ultrasonographic Murphy's sign is elicited.  Common Bile Duct:  0.6 cm in diameter; within normal limits in caliber.  Liver:  Normal parenchymal echogenicity and echotexture; no focal lesions identified.  Limited Doppler evaluation demonstrates normal blood flow within the liver.  IVC:  Unremarkable in appearance.   Pancreas:  Although the pancreas is difficult to visualize in its entirety due to overlying bowel gas, no focal pancreatic abnormality is identified.  Spleen:  12.9 cm in length; measures 348 mL in volume, within normal limits, with scattered calcified granulomata.  Right kidney:  5.8 cm in length; diffusely atrophic, with scattered small cysts.  Left kidney:  6.7 cm in length; diffusely atrophic, with scattered small cysts.  Abdominal Aorta:  Normal in caliber; no aneurysm identified. Plaque is noted along the course of the abdominal aorta.  Trace ascites is noted.  A small right pleural effusion is seen.  The patient's transplant kidney, noted within the pelvis, measures 10.1 cm in length, and demonstrates very mild hydronephrosis.  IMPRESSION:  1.  Diffusely thickened and edematous gallbladder wall, measuring up  to 1.0 cm in thickness, with a positive ultrasonographic Murphy's sign; this raises concern for cholecystitis.  No definite stones identified on ultrasound; this could conceivably reflect acalculous cholecystitis.  Suggest clinical correlation. 2.  Apparent 5 mm polyp noted at the base of the gallbladder. 3.  Diffusely atrophic native kidneys, reflecting the patient's chronic renal failure. 4.  Very mild hydronephrosis noted with respect to the patient's transplant kidney in the pelvis.   Original Report Authenticated By: Santa Lighter, M.D.    Dg Chest Port 1 View  11/06/2012  *RADIOLOGY REPORT*  Clinical Data: Fever, shortness of breath and chest pain.  PORTABLE CHEST - 1 VIEW  Comparison: Chest x-ray 11/15/2009.  Findings: Interval development of the interstitial prominence and ill-defined air space disease in the right base.  This does not obscure either the right hemidiaphragm or the right heart border. Fine pattern of peripheral micronodularity in the upper lobes of the lungs bilaterally are unchanged compared to prior examinations. No definite pleural effusions.  Pulmonary vasculature is  normal. Heart size is mildly enlarged.  Mediastinal contours are unremarkable.  Atherosclerosis of the thoracic aorta.  Old healed fracture of the posterolateral aspect of the left seventh rib is again noted.  Orthopedic fixation hardware in the mid cervical spine incompletely visualized.  IMPRESSION: 1.  New ill-defined interstitial and airspace opacity in the right base, favored to be in the right lower lobe, concerning for bronchopneumonia. 2.  Radiographic appearance of the chest is otherwise essentially unchanged compared to prior examinations, as above.   Original Report Authenticated By: Vinnie Langton, M.D.     Assessment/Plan Principal Problem:  *Cholecystitis, acute Active Problems:  ESRD (end stage renal disease)  HTN (hypertension)  GERD (gastroesophageal reflux disease)  Anemia  Thrombocytopenia   1. Acute cholecystitis. Patient was seen by general surgery, and due to multiple medical problems, medical admission was recommended. Patient will likely need her gallbladder removed during this hospitalization. We will keep her n.p.o. for now. Patient is started empirically on Zosyn. Since the patient is chronically on prednisone, we will give her stress dose steroids.  Check cortisol and lactate.  2. Thrombocytopenia. Likely related to underlying infectious process. We'll avoid any heparin products. Continue to follow as her clinical condition improves. May need to have platelet transfusion prior to any surgery. We'll defer this to surgery. 3. End-stage renal disease on hemodialysis, Tuesday Thursday and Saturday. Since the patient was just dialyzed yesterday, does not appear to be an urgent need for dialysis tonight. Nephrology consultation will be obtained in the morning. 4. HTN.  Hold antihypertensives for now. Use IV lopressor while NPO. 5. Anemia. Likely secondary to chronic renal disease. No signs of bleeding. 6. Pre operative evaluation. Per Zenia Resides revised cardiac index, patient  would be considered low risk and should proceed for necessary surgery.  Code Status: Full code Family Communication: discussed with patient, no family present Disposition Plan: pending hospital course  Time spent: 60 mins  [facility] Pager [phone]  If 7PM-7AM, please contact night-coverage [email or web address] Password [email or web address] 11/07/2012, 3:06 AM

## 2012-11-07 NOTE — Consult Note (Signed)
[facility] Renal Consultation Note    Indication for Consultation:  Management of ESRD/hemodialysis; anemia, hypertension/volume and secondary hyperparathyroidism  HPI: [name] is a 61 y.o. female with history of ESRD on hemodialysis, HTN, hyperlipidemia, GERD, gout, and chronic steroid therapy. She is s/p  transplant in 1996 with rejection, resuming dialysis in 2012.  She was in her usual state of health up until last Tuesday where she was noted to have some constipation / hard pellet stools. She began experiencing nausea and yellowish, mucoid diarrhea on Wednesday along with chills and fevers of up to 103.  Vancomycin and Tressie Ellis were initiated during HD on Saturday.  She presented to the [facility] ED on Sunday with weakness and progressively worsening RUQ pain.  Results of ultrasound were consistent with acute cholecystitis.    Blood cultures x 2 collected at the dialysis center on 11/23 and today - results pending.     Past Medical History  Diagnosis Date  . Anemia   . Anxiety   . Blood transfusion   . Depression   . GERD (gastroesophageal reflux disease)   . Hyperlipidemia   . Hypertension   . Chronic kidney disease     transplant 1996  . Neuromuscular disorder     neuropathy hand and legs  . Weight loss, unintentional   . Adenomatous polyp of colon 10/2010  . Diverticula, colon   . Heart murmur    Past Surgical History  Procedure Date  . Kidney transplant 1996  . Upper gastrointestinal endoscopy   . Back surgery   . Cervical fusion   . Av fistula placement     for dialysis  . Colonoscopy   . Abdominal hysterectomy    Family History  Problem Relation Age of Onset  . Colon cancer Brother   . Coronary artery disease Mother 47   Social History:  reports that she quit smoking about 20 years ago. Her smoking use included Cigarettes. She has never used smokeless tobacco. She reports that she does not drink alcohol or use illicit drugs.  Allergies    Allergen Reactions  . Sulfa Antibiotics     Both parents allergic-so will not take   Prior to Admission medications   Medication Sig Start Date End Date Taking? Authorizing Provider  allopurinol (ZYLOPRIM) 300 MG tablet Take 150 mg by mouth daily.    Yes Historical Provider, MD  b complex-vitamin c-folic acid (NEPHRO-VITE) 0.8 MG TABS Take 0.8 mg by mouth daily.    Yes Historical Provider, MD  doxazosin (CARDURA) 8 MG tablet Take 8 mg by mouth 2 (two) times daily.     Yes Historical Provider, MD  famotidine (PEPCID) 20 MG tablet Take 20 mg by mouth daily.     Yes Historical Provider, MD  HYDROcodone-acetaminophen (VICODIN) 5-500 MG per tablet Take 1 tablet by mouth every 6 (six) hours as needed. Back pain    Yes Historical Provider, MD  labetalol (NORMODYNE) 300 MG tablet Take 600 mg by mouth 2 (two) times daily.     Yes Historical Provider, MD  pravastatin (PRAVACHOL) 20 MG tablet Take 20 mg by mouth daily.     Yes Historical Provider, MD  predniSONE (DELTASONE) 5 MG tablet Take 5 mg by mouth daily.     Yes Historical Provider, MD  promethazine (PHENERGAN) 25 MG tablet Take 25 mg by mouth every 6 (six) hours as needed. nausea    Yes Historical Provider, MD  zolpidem (AMBIEN) 5 MG tablet Take 5 mg by  mouth at bedtime as needed. For insomnia   Yes Historical Provider, MD   Current Facility-Administered Medications  Medication Dose Route Frequency Provider Last Rate Last Dose  . acetaminophen (TYLENOL) tablet 650 mg  650 mg Oral Q6H PRN Kathie Dike, MD       Or  . acetaminophen (TYLENOL) suppository 650 mg  650 mg Rectal Q6H PRN Kathie Dike, MD      . hydrocortisone sodium succinate (SOLU-CORTEF) 100 mg/2 mL injection 50 mg  50 mg Intravenous Q8H Kathie Dike, MD   50 mg at 11/07/12 1133  . metoprolol (LOPRESSOR) injection 5 mg  5 mg Intravenous Q6H Kathie Dike, MD   5 mg at 11/07/12 1129  . ondansetron (ZOFRAN) tablet 4 mg  4 mg Oral Q6H PRN Kathie Dike, MD       Or  .  ondansetron (ZOFRAN) injection 4 mg  4 mg Intravenous Q6H PRN Kathie Dike, MD      . [COMPLETED] piperacillin-tazobactam (ZOSYN) IVPB 2.25 g  2.25 g Intravenous NOW Blanchie Dessert, MD   2.25 g at 11/07/12 0217  . piperacillin-tazobactam (ZOSYN) IVPB 2.25 g  2.25 g Intravenous Q8H Blanchie Dessert, MD   2.25 g at 11/07/12 0921  . sodium chloride 0.9 % injection 3 mL  3 mL Intravenous Q12H Kathie Dike, MD   3 mL at 11/07/12 1134   Labs: Basic Metabolic Panel:  Lab [id number] 0647 11/06/12 2113  NA 134* 137  K 4.1 3.9  CL 92* 95*  CO2 24 26  GLUCOSE 102* 108*  BUN 37* 31*  CREATININE 8.40* 7.32*  CALCIUM 9.4 9.6  ALB -- --  PHOS -- --   Liver Function Tests:  Lab 11/07/12 0647 11/06/12 2113  AST 55* 66*  ALT 30 35  ALKPHOS 97 103  BILITOT 0.4 0.4  PROT 6.7 6.9  ALBUMIN 2.7* 2.8*   CBC:  Lab 11/07/12 0647 11/06/12 2113  WBC 9.7 9.6  NEUTROABS -- 6.1  HGB 10.8* 11.6*  HCT 33.2* 35.7*  MCV 92.7 94.4  PLT 50* 84*   Iron Studies: 10/06/2012 - Hgb 11.9, TSat 19%, Ferritin 991  Studies/Results: US Abdomen Complete  11/07/2012  *RADIOLOGY REPORT*  Clinical Data:  Abdominal pain and fever.  ABDOMINAL ULTRASOUND COMPLETE  Comparison:  Renal ultrasound performed 03/04/2008, and MRI of the pelvis performed 08/19/2012  Findings:  Gallbladder: The gallbladder wall is thickened and edematous, measuring up to 1.0 cm in thickness.  A 5 mm polyp is noted near the base of the gallbladder.  No significant pericholecystic fluid is seen; a positive ultrasonographic Murphy's sign is elicited.  Common Bile Duct:  0.6 cm in diameter; within normal limits in caliber.  Liver:  Normal parenchymal echogenicity and echotexture; no focal lesions identified.  Limited Doppler evaluation demonstrates normal blood flow within the liver.  IVC:  Unremarkable in appearance.  Pancreas:  Although the pancreas is difficult to visualize in its entirety due to overlying bowel gas, no focal pancreatic  abnormality is identified.  Spleen:  12.9 cm in length; measures 348 mL in volume, within normal limits, with scattered calcified granulomata.  Right kidney:  5.8 cm in length; diffusely atrophic, with scattered small cysts.  Left kidney:  6.7 cm in length; diffusely atrophic, with scattered small cysts.  Abdominal Aorta:  Normal in caliber; no aneurysm identified. Plaque is noted along the course of the abdominal aorta.  Trace ascites is noted.  A small right pleural effusion is seen.  The patient's transplant kidney, noted  within the pelvis, measures 10.1 cm in length, and demonstrates very mild hydronephrosis.  IMPRESSION:  1.  Diffusely thickened and edematous gallbladder wall, measuring up to 1.0 cm in thickness, with a positive ultrasonographic Murphy's sign; this raises concern for cholecystitis.  No definite stones identified on ultrasound; this could conceivably reflect acalculous cholecystitis.  Suggest clinical correlation. 2.  Apparent 5 mm polyp noted at the base of the gallbladder. 3.  Diffusely atrophic native kidneys, reflecting the patient's chronic renal failure. 4.  Very mild hydronephrosis noted with respect to the patient's transplant kidney in the pelvis.   Original Report Authenticated By: Santa Lighter, M.D.    Dg Chest Port 1 View  11/06/2012  *RADIOLOGY REPORT*  Clinical Data: Fever, shortness of breath and chest pain.  PORTABLE CHEST - 1 VIEW  Comparison: Chest x-ray 11/15/2009.  Findings: Interval development of the interstitial prominence and ill-defined air space disease in the right base.  This does not obscure either the right hemidiaphragm or the right heart border. Fine pattern of peripheral micronodularity in the upper lobes of the lungs bilaterally are unchanged compared to prior examinations. No definite pleural effusions.  Pulmonary vasculature is normal. Heart size is mildly enlarged.  Mediastinal contours are unremarkable.  Atherosclerosis of the thoracic aorta.  Old  healed fracture of the posterolateral aspect of the left seventh rib is again noted.  Orthopedic fixation hardware in the mid cervical spine incompletely visualized.  IMPRESSION: 1.  New ill-defined interstitial and airspace opacity in the right base, favored to be in the right lower lobe, concerning for bronchopneumonia. 2.  Radiographic appearance of the chest is otherwise essentially unchanged compared to prior examinations, as above.   Original Report Authenticated By: Vinnie Langton, M.D.     ROS:  Positive for fever, cough, RUQ pain, mucoid diarrhea 10 pt ROS asked and answered.  All systems negative except at described in HPI above.   Physical Exam: Filed Vitals:   11/06/12 2232 11/07/12 0300 11/07/12 0346 11/07/12 0931  BP:  151/80 159/86 137/86  Pulse:  81 99 81  Temp: 100.4 F (38 C)  100.6 F (38.1 C) 98.1 F (36.7 C)  TempSrc: Rectal  Oral Oral  Resp:  20 20 20   Weight:   62.37 kg (137 lb 8 oz)   SpO2:  95% 96% 96%     General: Well developed, well nourished, in no acute distress. Head: Normocephalic, atraumatic, sclera non-icteric, mucus membranes are moist Neck: Supple. JVD not elevated. Lungs: Clear bilaterally to auscultation without wheezes, rales, or rhonchi. Breathing is unlabored.  Heart: RRR. No murmurs, rubs, or gallops appreciated. Abdomen: Soft, mildly-distended with + RUQ tenderness. Normoactive bowel sounds. No rebound/guarding. No obvious abdominal masses. M-S:  Strength 5/5 throughout. Lower extremities: Trace LE edema bilaterally. SCD's present. No open wounds or ischemic changes.  Neuro: Alert and oriented X 3. Moves all extremities spontaneously. Psych:  Responds to questions appropriately with a normal affect. Dialysis Access: Left arm AVF patent  Dialysis Orders: Center: [facility]  on TTS. EDW 61.5 kg HD Bath 2K+/ 2Ca++ Optiflux180 Time 4:00 Heparin 1200 u. Access LUA AVF BFR 350 DFR 500 Needle: 16g Zemplar 9 mcg IV/HD Epogen 6600  Units IV/HD  Venofer   None  Other  Assessment/Plan: 1.  Acute Cholecystitis - U/S shows diffusely thickened, edmoatous gallbladder wall. Tmax 100.6 over last 24 hours. On IV Zosyn.  Cholecystectomy planned for today per primary. . 2. Thrombocytopenia - Platelets decreased from 84 to 50. Thrombocytopenia is new finding  per primary. Peripheral smear and LDH pending.  3.  ESRD -  Dialyzes TTS at [facility].  Last HD on Saturday 11/24. K+ 4.1. Plan to dialyze tomorrow. No heparin.  4.  Hypertension/volume  - BP Q000111Q systolic. On Lopressor 5 mg IV Q 6 hrs as inpatient.  Home meds of Cardura 8mg  BID and Labetolol 600 mg BID held per primary. No s/s of volume excess.  5.  Anemia  - Hgb 10.8.  Tsat 19 and Ferritin 991 on 10/06/12.  Outpatient Epogen 6600 units Q HD 6.  Metabolic bone disease -  Ca+ 9.4 (10.4 corrected). Phos 6.6 and iPTH 327.0 on 10/06/12. At home, on Sensipar 30 mg Q day and Tums Ultra 1000 mg 1 TID with meals.  7. Nutrition - Albumin 2.7. NPO for now. Advance high protein renal diet when appropriate post-operatively. 8. Gout - Stable on home meds - allopurinol daily and Colcrys PRN.  9. Chronic steroid therapy - Prednisone 5mg  QDay.  Pre-op stress dosing managed per primary. 10. GERD - Famotidine 20 mg QD at home - held for now 11.  Hyperlipidemia - Pravastatin 20 mg QD - held for now    Sonnie Alamo, PA-C 11/07/2012, 12:25 PM   Acute cholecystitis symptomatically improved with antibiotics. Thrombocytopenia complicating matters possibly related to sepsis or medication.  Work up is pending.  Surgery considering options including cholecystotomy if PLTs cont to fall.  Next HD Tuesday.  Agree with note above with changes included. Damarkus Balis C

## 2012-11-07 NOTE — Consult Note (Signed)
Reason for Consult:abd pain Referring Physician: Dr. Marjory Lies is an 62 y.o. female.  HPI: 61 yo bf with history of failed kidney transplant who presents with right upper quadrant pain for the last 4 days. It has been associated with nausea and dry heaves as well as diarrhea. U/s shows thickened gallbladder wall  Past Medical History  Diagnosis Date  . Anemia   . Anxiety   . Blood transfusion   . Depression   . GERD (gastroesophageal reflux disease)   . Hyperlipidemia   . Hypertension   . Chronic kidney disease     transplant 1996  . Neuromuscular disorder     neuropathy hand and legs  . Weight loss, unintentional   . Adenomatous polyp of colon 10/2010  . Diverticula, colon   . Heart murmur     Past Surgical History  Procedure Date  . Kidney transplant 1996  . Upper gastrointestinal endoscopy   . Back surgery   . Cervical fusion   . Av fistula placement     for dialysis  . Colonoscopy   . Abdominal hysterectomy     Family History  Problem Relation Age of Onset  . Colon cancer Brother   . Coronary artery disease Mother 25    Social History:  reports that she quit smoking about 20 years ago. Her smoking use included Cigarettes. She has never used smokeless tobacco. She reports that she does not drink alcohol or use illicit drugs.  Allergies:  Allergies  Allergen Reactions  . Sulfa Antibiotics     Both parents allergic-so will not take    Medications: I have reviewed the patient's current medications.  Results for orders placed during the hospital encounter of 11/06/12 (from the past 48 hour(s))  CBC WITH DIFFERENTIAL     Status: Abnormal   Collection Time   11/06/12  9:13 PM      Component Value Range Comment   WBC 9.6  4.0 - 10.5 K/uL    RBC 3.78 (*) 3.87 - 5.11 MIL/uL    Hemoglobin 11.6 (*) 12.0 - 15.0 g/dL    HCT 35.7 (*) 36.0 - 46.0 %    MCV 94.4  78.0 - 100.0 fL    MCH 30.7  26.0 - 34.0 pg    MCHC 32.5  30.0 - 36.0 g/dL    RDW  17.2 (*) 11.5 - 15.5 %    Platelets 84 (*) 150 - 400 K/uL PLATELET COUNT CONFIRMED BY SMEAR   Neutrophils Relative 64  43 - 77 %    Neutro Abs 6.1  1.7 - 7.7 K/uL CORRECTED ON 11/24 AT 2254: PREVIOUSLY REPORTED AS 6.2   Lymphocytes Relative 17  12 - 46 %    Lymphs Abs 1.6  0.7 - 4.0 K/uL CORRECTED ON 11/24 AT 2254: PREVIOUSLY REPORTED AS 1.7   Monocytes Relative 11  3 - 12 %    Monocytes Absolute 1.1 (*) 0.1 - 1.0 K/uL    Eosinophils Relative 6 (*) 0 - 5 %    Eosinophils Absolute 0.6  0.0 - 0.7 K/uL    Basophils Relative 2 (*) 0 - 1 %    Basophils Absolute 0.2 (*) 0.0 - 0.1 K/uL CORRECTED ON 11/24 AT 2254: PREVIOUSLY REPORTED AS 0.1  COMPREHENSIVE METABOLIC PANEL     Status: Abnormal   Collection Time   11/06/12  9:13 PM      Component Value Range Comment   Sodium 137  135 - 145 mEq/L  Potassium 3.9  3.5 - 5.1 mEq/L    Chloride 95 (*) 96 - 112 mEq/L    CO2 26  19 - 32 mEq/L    Glucose, Bld 108 (*) 70 - 99 mg/dL    BUN 31 (*) 6 - 23 mg/dL    Creatinine, Ser 7.32 (*) 0.50 - 1.10 mg/dL    Calcium 9.6  8.4 - 10.5 mg/dL    Total Protein 6.9  6.0 - 8.3 g/dL    Albumin 2.8 (*) 3.5 - 5.2 g/dL    AST 66 (*) 0 - 37 U/L    ALT 35  0 - 35 U/L    Alkaline Phosphatase 103  39 - 117 U/L    Total Bilirubin 0.4  0.3 - 1.2 mg/dL    GFR calc non Af Amer 5 (*) >90 mL/min    GFR calc Af Amer 6 (*) >90 mL/min   LACTIC ACID, PLASMA     Status: Normal   Collection Time   11/06/12 10:30 PM      Component Value Range Comment   Lactic Acid, Venous 1.9  0.5 - 2.2 mmol/L     US Abdomen Complete  11/07/2012  *RADIOLOGY REPORT*  Clinical Data:  Abdominal pain and fever.  ABDOMINAL ULTRASOUND COMPLETE  Comparison:  Renal ultrasound performed 03/04/2008, and MRI of the pelvis performed 08/19/2012  Findings:  Gallbladder: The gallbladder wall is thickened and edematous, measuring up to 1.0 cm in thickness.  A 5 mm polyp is noted near the base of the gallbladder.  No significant pericholecystic fluid is  seen; a positive ultrasonographic Murphy's sign is elicited.  Common Bile Duct:  0.6 cm in diameter; within normal limits in caliber.  Liver:  Normal parenchymal echogenicity and echotexture; no focal lesions identified.  Limited Doppler evaluation demonstrates normal blood flow within the liver.  IVC:  Unremarkable in appearance.  Pancreas:  Although the pancreas is difficult to visualize in its entirety due to overlying bowel gas, no focal pancreatic abnormality is identified.  Spleen:  12.9 cm in length; measures 348 mL in volume, within normal limits, with scattered calcified granulomata.  Right kidney:  5.8 cm in length; diffusely atrophic, with scattered small cysts.  Left kidney:  6.7 cm in length; diffusely atrophic, with scattered small cysts.  Abdominal Aorta:  Normal in caliber; no aneurysm identified. Plaque is noted along the course of the abdominal aorta.  Trace ascites is noted.  A small right pleural effusion is seen.  The patient's transplant kidney, noted within the pelvis, measures 10.1 cm in length, and demonstrates very mild hydronephrosis.  IMPRESSION:  1.  Diffusely thickened and edematous gallbladder wall, measuring up to 1.0 cm in thickness, with a positive ultrasonographic Murphy's sign; this raises concern for cholecystitis.  No definite stones identified on ultrasound; this could conceivably reflect acalculous cholecystitis.  Suggest clinical correlation. 2.  Apparent 5 mm polyp noted at the base of the gallbladder. 3.  Diffusely atrophic native kidneys, reflecting the patient's chronic renal failure. 4.  Very mild hydronephrosis noted with respect to the patient's transplant kidney in the pelvis.   Original Report Authenticated By: Santa Lighter, M.D.    Dg Chest Port 1 View  11/06/2012  *RADIOLOGY REPORT*  Clinical Data: Fever, shortness of breath and chest pain.  PORTABLE CHEST - 1 VIEW  Comparison: Chest x-ray 11/15/2009.  Findings: Interval development of the interstitial  prominence and ill-defined air space disease in the right base.  This does not obscure either the  right hemidiaphragm or the right heart border. Fine pattern of peripheral micronodularity in the upper lobes of the lungs bilaterally are unchanged compared to prior examinations. No definite pleural effusions.  Pulmonary vasculature is normal. Heart size is mildly enlarged.  Mediastinal contours are unremarkable.  Atherosclerosis of the thoracic aorta.  Old healed fracture of the posterolateral aspect of the left seventh rib is again noted.  Orthopedic fixation hardware in the mid cervical spine incompletely visualized.  IMPRESSION: 1.  New ill-defined interstitial and airspace opacity in the right base, favored to be in the right lower lobe, concerning for bronchopneumonia. 2.  Radiographic appearance of the chest is otherwise essentially unchanged compared to prior examinations, as above.   Original Report Authenticated By: Vinnie Langton, M.D.     Review of Systems  Constitutional: Positive for fever.  HENT: Negative.   Eyes: Negative.   Respiratory: Negative.   Cardiovascular: Negative.   Gastrointestinal: Positive for nausea, abdominal pain and diarrhea.  Genitourinary: Negative.   Musculoskeletal: Negative.   Skin: Negative.   Neurological: Negative.   Endo/Heme/Allergies: Negative.   Psychiatric/Behavioral: Negative.    Blood pressure 119/69, pulse 103, temperature 100.4 F (38 C), temperature source Rectal, resp. rate 16, SpO2 100.00%. Physical Exam  Constitutional: She is oriented to person, place, and time. She appears well-developed and well-nourished.  HENT:  Head: Normocephalic and atraumatic.  Eyes: Conjunctivae normal and EOM are normal. Pupils are equal, round, and reactive to light.  Neck: Normal range of motion. Neck supple.  Cardiovascular: Normal rate, regular rhythm and normal heart sounds.   Respiratory: Effort normal and breath sounds normal.  GI: Soft.       Very  tender in right upper quadrant. No palpable mass  Musculoskeletal: Normal range of motion.  Neurological: She is alert and oriented to person, place, and time.  Skin: Skin is warm and dry.  Psychiatric: She has a normal mood and affect. Her behavior is normal.    Assessment/Plan: Pt has what sounds like cholecystitis. Plan for medicine admission to manage renal failure and other medical issues. Will start on broad spectrum abx. She will probably benefit from having her gallbladder removed during this admission.  TOTH III,Kristelle Cavallaro S 11/07/2012, 1:55 AM

## 2012-11-07 NOTE — Progress Notes (Signed)
61yo female with ESRD on HD c/o mucusy diarrhea x5 d and now with decreased appetite, bloating, burping, abdominal distention, and RUQ pain, CT concerning for cholecystitis, to begin Zosyn.  Will begin Zosyn 2.25g IV Q8H and monitor.  Wynona Neat, PharmD, BCPS 11/07/2012 2:01 AM

## 2012-11-08 ENCOUNTER — Other Ambulatory Visit: Payer: Self-pay | Admitting: Oncology

## 2012-11-08 ENCOUNTER — Other Ambulatory Visit: Payer: Self-pay

## 2012-11-08 DIAGNOSIS — R918 Other nonspecific abnormal finding of lung field: Secondary | ICD-10-CM | POA: Diagnosis not present

## 2012-11-08 DIAGNOSIS — Z992 Dependence on renal dialysis: Secondary | ICD-10-CM

## 2012-11-08 DIAGNOSIS — K819 Cholecystitis, unspecified: Secondary | ICD-10-CM | POA: Diagnosis not present

## 2012-11-08 DIAGNOSIS — N186 End stage renal disease: Secondary | ICD-10-CM | POA: Diagnosis not present

## 2012-11-08 DIAGNOSIS — D696 Thrombocytopenia, unspecified: Secondary | ICD-10-CM

## 2012-11-08 DIAGNOSIS — D649 Anemia, unspecified: Secondary | ICD-10-CM | POA: Diagnosis not present

## 2012-11-08 DIAGNOSIS — D631 Anemia in chronic kidney disease: Secondary | ICD-10-CM | POA: Diagnosis not present

## 2012-11-08 DIAGNOSIS — K810 Acute cholecystitis: Secondary | ICD-10-CM | POA: Diagnosis not present

## 2012-11-08 DIAGNOSIS — N039 Chronic nephritic syndrome with unspecified morphologic changes: Secondary | ICD-10-CM | POA: Diagnosis not present

## 2012-11-08 DIAGNOSIS — I120 Hypertensive chronic kidney disease with stage 5 chronic kidney disease or end stage renal disease: Secondary | ICD-10-CM | POA: Diagnosis not present

## 2012-11-08 LAB — OCCULT BLOOD X 1 CARD TO LAB, STOOL
Fecal Occult Bld: NEGATIVE
Fecal Occult Bld: NEGATIVE

## 2012-11-08 LAB — CBC
HCT: 28.9 % — ABNORMAL LOW (ref 36.0–46.0)
Hemoglobin: 9.7 g/dL — ABNORMAL LOW (ref 12.0–15.0)
MCH: 30.2 pg (ref 26.0–34.0)
MCHC: 33.6 g/dL (ref 30.0–36.0)
MCV: 90 fL (ref 78.0–100.0)
Platelets: 40 10*3/uL — ABNORMAL LOW (ref 150–400)
RBC: 3.21 MIL/uL — ABNORMAL LOW (ref 3.87–5.11)
RDW: 17.1 % — ABNORMAL HIGH (ref 11.5–15.5)
WBC: 7.7 10*3/uL (ref 4.0–10.5)

## 2012-11-08 LAB — COMPREHENSIVE METABOLIC PANEL
ALT: 25 U/L (ref 0–35)
AST: 43 U/L — ABNORMAL HIGH (ref 0–37)
Alkaline Phosphatase: 88 U/L (ref 39–117)
CO2: 30 mEq/L (ref 19–32)
Chloride: 92 mEq/L — ABNORMAL LOW (ref 96–112)
Creatinine, Ser: 4.26 mg/dL — ABNORMAL HIGH (ref 0.50–1.10)
GFR calc non Af Amer: 10 mL/min — ABNORMAL LOW (ref >90)
Potassium: 3.7 mEq/L (ref 3.5–5.1)
Sodium: 136 mEq/L (ref 135–145)
Total Bilirubin: 0.5 mg/dL (ref 0.3–1.2)

## 2012-11-08 LAB — CBC WITH DIFFERENTIAL/PLATELET
Basophils Relative: 1 % (ref 0–1)
Eosinophils Absolute: 0.1 10*3/uL (ref 0.0–0.7)
Hemoglobin: 10.3 g/dL — ABNORMAL LOW (ref 12.0–15.0)
MCH: 30.7 pg (ref 26.0–34.0)
MCHC: 33.1 g/dL (ref 30.0–36.0)
Monocytes Relative: 18 % — ABNORMAL HIGH (ref 3–12)
Neutrophils Relative %: 57 % (ref 43–77)

## 2012-11-08 LAB — TROPONIN I: Troponin I: <0.3 ng/mL (ref <0.30)

## 2012-11-08 LAB — RETICULOCYTES: RBC.: 3.35 MIL/uL — ABNORMAL LOW (ref 3.87–5.11)

## 2012-11-08 MED ORDER — PARICALCITOL 5 MCG/ML IV SOLN
INTRAVENOUS | Status: AC
  Administered 2012-11-08: 9 ug via INTRAVENOUS
  Filled 2012-11-08: qty 2

## 2012-11-08 MED ORDER — ZOLPIDEM TARTRATE 5 MG PO TABS: 5.0000 mg | ORAL_TABLET | Freq: Every evening | ORAL | Status: DC | PRN

## 2012-11-08 MED ORDER — RENA-VITE PO TABS
1.0000 | ORAL_TABLET | Freq: Every day | ORAL | Status: DC
  Administered 2012-11-08: 1 via ORAL
  Filled 2012-11-08 (×2): qty 1

## 2012-11-08 MED ORDER — SIMVASTATIN 10 MG PO TABS
10.0000 mg | ORAL_TABLET | Freq: Every day | ORAL | Status: DC
  Administered 2012-11-08: 10 mg via ORAL
  Filled 2012-11-08 (×2): qty 1

## 2012-11-08 MED ORDER — LABETALOL HCL 300 MG PO TABS
600.0000 mg | ORAL_TABLET | Freq: Two times a day (BID) | ORAL | Status: DC
  Administered 2012-11-08 – 2012-11-09 (×2): 600 mg via ORAL
  Filled 2012-11-08 (×3): qty 2

## 2012-11-08 MED ORDER — DARBEPOETIN ALFA-POLYSORBATE 25 MCG/0.42ML IJ SOLN
INTRAMUSCULAR | Status: AC
  Administered 2012-11-08: 25 ug via INTRAVENOUS
  Filled 2012-11-08: qty 0.42

## 2012-11-08 MED ORDER — DOXAZOSIN MESYLATE 8 MG PO TABS
8.0000 mg | ORAL_TABLET | Freq: Two times a day (BID) | ORAL | Status: DC
  Administered 2012-11-08 – 2012-11-09 (×2): 8 mg via ORAL
  Filled 2012-11-08 (×3): qty 1

## 2012-11-08 MED ORDER — FAMOTIDINE 20 MG PO TABS
20.0000 mg | ORAL_TABLET | Freq: Every day | ORAL | Status: DC
  Administered 2012-11-08 – 2012-11-09 (×2): 20 mg via ORAL
  Filled 2012-11-08 (×2): qty 1

## 2012-11-08 MED ORDER — CALCIUM CARBONATE 1250 MG/5ML PO SUSP: 500.0000 mg | Freq: Four times a day (QID) | ORAL | Status: DC | PRN

## 2012-11-08 MED ORDER — ALLOPURINOL 150 MG HALF TABLET
150.0000 mg | ORAL_TABLET | Freq: Every day | ORAL | Status: DC
  Administered 2012-11-08: 150 mg via ORAL
  Filled 2012-11-08 (×2): qty 1

## 2012-11-08 MED ORDER — PREDNISONE 5 MG PO TABS
5.0000 mg | ORAL_TABLET | Freq: Every day | ORAL | Status: DC
  Administered 2012-11-08 – 2012-11-09 (×2): 5 mg via ORAL
  Filled 2012-11-08 (×2): qty 1

## 2012-11-08 NOTE — Procedures (Signed)
On dialysis. Had bloody stool last PM. No heparin given. Stable hemodynamics.  Using left arm AV access. Platelets down to 40K and Hgb 9.7g. LDH up a little.  Abdominal pain less but still has RUQ tenderness. May need Hematology Consultation. Estanislado Emms

## 2012-11-08 NOTE — Progress Notes (Signed)
Denies complaints, denies pain, 0/10 at this time.  NAD, will continue to monitor.

## 2012-11-08 NOTE — Progress Notes (Signed)
Patient reported having 1 episode of a loose bloody stool.  She is in no acute distress and vital sign is stable.  Soft, non-tender abdomen with active bowel sounds.  MD notified.  Will continue to monitor

## 2012-11-08 NOTE — Progress Notes (Signed)
C/o CP 8/10, diaphoretic.  CP c inspiration, unable to reproduce.  Denies other complaints.  VSS, NAD.  EKG remains NSR.  After EKG done, pt "feels a little bit better."  CP 5/10, "maybe indigestion is what I have."  Denies other complaints, will continue to monitor.

## 2012-11-08 NOTE — Progress Notes (Signed)
Patient ID: [name], female   DOB: [birth year], 61 y.o.   MRN: [id number] [facility] PROGRESS NOTE  [name] [id number] DOB: Jan 28, 1951 DOA: 11/06/2012 PCP: Estanislado Emms, MD  Brief narrative: Pt is 61 y.o. female with past medical history of end-stage renal disease on hemodialysis, failed renal transplant, chronic steroid therapy, hypertension. Patient was in her usual state of health when 5 days prior to admission she started to develop watery diarrhea, and nausea, associated with high fevers of 103. She went to her dialysis unit on Saturday and was given one dose of vancomycin, as well as another antibiotic. Blood cultures were sent at that time. Patient was discharged home. She began having right upper quadrant pain which has now gotten progressively worse. She denied any shortness of breath, chest pain. Pt was admitted for evaluation and management of acute cholecystitis.   Principal Problem:  *Cholecystitis, acute - appreciate surgery following and since pt is hemodynamically stable at this point plan is to defer surgical intervention - if pt's condition worsens plan is to place percutaneous cholecystostomy tube - will continue Zosyn and analgesia as needed Active Problems:  ESRD (end stage renal disease) - nephrology team following - appreciate help  HTN (hypertension) - slightly above the target range but no home medications were ordered on admission - I will place order to continue her home regimen with Labetalol and Doxazosin  GERD (gastroesophageal reflux disease) - stable - will continue Pepcid  Anemia of chronic disease - slight drop in hemoglobin since yesterday and pt did report vaginal bleeding and blood in stool this AM - FOBT was negative - CBC in AM  Thrombocytopenia - new finding on this admission and of unclear etiology - I have asked for hematology consult and I will follow up on recommendations - CBC in  AM  Consultants:  Surgery  Nephrology  Hematology  Procedures/Studies: US Abdomen Complete 11/07/2012   1. Diffusely thickened and edematous gallbladder wall, measuring up to 1.0 cm in thickness, with a positive ultrasonographic Murphy's sign; this raises concern for cholecystitis.  No definite stones identified on ultrasound; this could conceivably reflect acalculous cholecystitis.  Suggest clinical correlation.  2.  Apparent 5 mm polyp noted at the base of the gallbladder.  3.  Diffusely atrophic native kidneys, reflecting the patient's chronic renal failure.  4.  Very mild hydronephrosis noted with respect to the patient's transplant kidney in the pelvis.    Dg Chest Port 1 View 11/06/2012   1.  New ill-defined interstitial and airspace opacity in the right base, favored to be in the right lower lobe, concerning for bronchopneumonia.  2.  Radiographic appearance of the chest is otherwise essentially unchanged compared to prior examinations, as above.     Antibiotics:  Zosyn 11/25 -->  Code Status: Full Family Communication: Pt at bedside Disposition Plan: Home when medically stable  HPI/Subjective: No events overnight.   Objective: Filed Vitals:   11/08/12 1127 11/08/12 1132 11/08/12 1216 11/08/12 1406  BP: 145/94 161/90 137/77 144/84  Pulse: 72 68 74 74  Temp:  96.9 F (36.1 C) 98.2 F (36.8 C) 98.2 F (36.8 C)  TempSrc:  Oral    Resp: 16 19 18 17   Weight:  61.5 kg (135 lb 9.3 oz)    SpO2:  98% 97% 99%    Intake/Output Summary (Last 24 hours) at 11/08/12 1440 Last data filed at 11/08/12 1406  Gross per 24 hour  Intake    410 ml  Output  1062 ml  Net   -652 ml    Exam:   General:  Pt is alert, follows commands appropriately, not in acute distress  Cardiovascular: Regular rate and rhythm, S1/S2, no murmurs, no rubs, no gallops  Respiratory: Clear to auscultation bilaterally, no wheezing, no crackles, no rhonchi  Abdomen: Soft, tender in right upper  quadrant area, non distended, bowel sounds present, no guarding  Extremities: No edema, pulses DP and PT palpable bilaterally  Neuro: Grossly nonfocal  Data Reviewed: Basic Metabolic Panel:  Lab [id number] 11/06/12 2113  NA 134* 137  K 4.1 3.9  CL 92* 95*  CO2 24 26  GLUCOSE 102* 108*  BUN 37* 31*  CREATININE 8.40* 7.32*  CALCIUM 9.4 9.6  MG -- --  PHOS -- --   Liver Function Tests:  Lab 11/07/12 0647 11/06/12 2113  AST 55* 66*  ALT 30 35  ALKPHOS 97 103  BILITOT 0.4 0.4  PROT 6.7 6.9  ALBUMIN 2.7* 2.8*   CBC:  Lab 11/08/12 11/07/12 0647 11/06/12 2113  WBC 7.7 9.7 9.6  NEUTROABS -- -- 6.1  HGB 9.7* 10.8* 11.6*  HCT 28.9* 33.2* 35.7*  MCV 90.0 92.7 94.4  PLT 40* 50* 84*   Cardiac Enzymes:  Lab 11/08/12 0243  CKTOTAL --  CKMB --  CKMBINDEX --  TROPONINI <0.30   Recent Results (from the past 240 hour(s))  MRSA PCR SCREENING     Status: Normal   Collection Time   11/07/12  3:59 AM      Component Value Range Status Comment   MRSA by PCR NEGATIVE  NEGATIVE Final   CULTURE, BLOOD (ROUTINE X 2)     Status: Normal (Preliminary result)   Collection Time   11/07/12  6:47 AM      Component Value Range Status Comment   Specimen Description BLOOD RIGHT ARM   Final    Special Requests BOTTLES DRAWN AEROBIC AND ANAEROBIC 10CC   Final    Culture  Setup Time 11/07/2012 16:28   Final    Culture     Final    Value:        BLOOD CULTURE RECEIVED NO GROWTH TO DATE CULTURE WILL BE HELD FOR 5 DAYS BEFORE ISSUING A FINAL NEGATIVE REPORT   Report Status PENDING   Incomplete   CULTURE, BLOOD (ROUTINE X 2)     Status: Normal (Preliminary result)   Collection Time   11/07/12  7:05 AM      Component Value Range Status Comment   Specimen Description BLOOD RIGHT FOREARM   Final    Special Requests     Final    Value: BOTTLES DRAWN AEROBIC AND ANAEROBIC AERO 6CC ANA 5CC   Culture  Setup Time 11/07/2012 16:28   Final    Culture     Final    Value:        BLOOD CULTURE  RECEIVED NO GROWTH TO DATE CULTURE WILL BE HELD FOR 5 DAYS BEFORE ISSUING A FINAL NEGATIVE REPORT   Report Status PENDING   Incomplete      Scheduled Meds:   . SOLU-CORTEF injection  50 mg Intravenous Q8H  . metoprolol  5 mg Intravenous Q6H  . piperacillin-tazobactam (ZOSYN)  IV  2.25 g Intravenous Q8H   Continuous Infusions:    Faye Ramsay, MD  [facility] Pager [phone]  If 7PM-7AM, please contact night-coverage [email or web address] Password [email or web address] 11/08/2012, 2:40 PM   LOS: 2 days

## 2012-11-08 NOTE — Progress Notes (Signed)
ID: [name]   DOB: 17-May-1951  MR#: [id number]  [id number]  PCP: Estanislado Emms, MD GYN:  SU:  OTHER MD: Minus Breeding, Dierdre Harness   HISTORY OF PRESENT ILLNESS: The patient is a 61 year old [place] woman referred by Dr. Doyle Askew for evaluation of thrombocytopenia.  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 She receives hemodialysis through [facility] on Tuesdays, Thursdays, and Saturdays. Unfortunately I do not have access to any of their weekly CBCs.  Approximately 3 days prior to admission, the patient started to feel nauseated , having dry heaves, and developed temperatures up to 103. She reported this at her Saturday hemodialysis, and the patient tells me blood cultures were obtained. She was started on antibiotics at that time. On November 24 she presented to the emergency room with these symptoms, and was found to have abdominal distention and right upper quadrant pain. Chest x-ray showed air space disease in the right lower lobe an abdominal ultrasound showed a diffusely thickened and edematous gallbladder wall, measuring up to 1.0 cm in thickness, with a positive ultrasonographic Murphy's sign. No gallstones were noted. Incidentally the spleen was normal in size. The patient was felt to likely have acalculous cholecystitis and was referred to surgery.  However the patient's platelet count on 11/06/2012 was 84,000. White cells were 9.6, hemoglobin 11.6, with an MCV of 94.4. The differential showed 64% neutrophils. The next day the patient's platelet count was 50,000, and her hemoglobin was down to 10.8. Today the patient's platelet count was down to 40,000, and her hemoglobin down to 9.7.  The most recent CBC I can locate tonight prior to this admission dates to 09/24/2011. At that time the patient's platelet count was 209,000.  REVIEW OF SYSTEMS: The patient tells me  she has seen blood in her stools for the last day and a half. This is consistent with hemorrhoidal bleeding and she does have a history of hemorrhoids. There has been no hemoptysis or frank hematochezia. There have been no bruising. She tells me also that she had no unusual bleeding through multiple surgeries including 2 cervical laminectomies, and 2 vaginal deliveries. Currently she denies nausea or vomiting. She has no unusual headaches, visual changes, cough, or phlegm production. She denies shortness of breath or pleurisy. Normally she is slightly constipated or low her stools can be runny. She does not make any urine.  PAST MEDICAL HISTORY: Past Medical History  Diagnosis Date  . Anemia   . Anxiety   . Blood transfusion   . Depression   . GERD (gastroesophageal reflux disease)   . Hyperlipidemia   . Hypertension   . Chronic kidney disease     transplant 1996  . Neuromuscular disorder     neuropathy hand and legs  . Weight loss, unintentional   . Adenomatous polyp of colon 10/2010  . Diverticula, colon   . Heart murmur     PAST SURGICAL HISTORY: Past Surgical History  Procedure Date  . Kidney transplant 1996  . Upper gastrointestinal endoscopy   . Back surgery   . Cervical fusion   . Av fistula placement     for dialysis  . Colonoscopy   . Abdominal hysterectomy     FAMILY HISTORY Family History  Problem Relation Age of Onset  . Colon cancer Brother   . Coronary artery disease Mother 14   the patient's father died from emphysema at the age of 60. The  patient's mother is alive at age 37, and in good health. The patient has one brother and one sister. There is no history of blood problems in the family to her knowledge.  GYNECOLOGIC HISTORY: GX P2, first pregnancy to term age 4, last menstrual period more than 20 years ago. She never took hormone replacement.  SOCIAL HISTORY: The patient has a degree in cosmetology and also attempted designed school. She has had  various jobs most recently in childcare. Her husband of 67 years died from prostate cancer in 27-Mar-2010. Son Marjory Lies 25 works in [employer] son Quillian Quince, 28, works at [employer] and also runs a [employer] on the side. The patient has 2 grandchildren and one on the way. 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   ADVANCED DIRECTIVES:  HEALTH MAINTENANCE: History  Substance Use Topics  . Smoking status: Former Smoker    Types: Cigarettes    Quit date: 12/31/1991  . Smokeless tobacco: Never Used  . Alcohol Use: No     Colonoscopy:  PAP:  Bone density:  Lipid panel:  Allergies  Allergen Reactions  . Sulfa Antibiotics     Both parents allergic-so will not take    Current Facility-Administered Medications  Medication Dose Route Frequency Provider Last Rate Last Dose  . acetaminophen (TYLENOL) tablet 650 mg  650 mg Oral Q6H PRN Kathie Dike, MD       Or  . acetaminophen (TYLENOL) suppository 650 mg  650 mg Rectal Q6H PRN Kathie Dike, MD      . allopurinol (ZYLOPRIM) tablet 150 mg  150 mg Oral QHS Theodis Blaze, MD      . calcium carbonate (dosed in mg elemental calcium) suspension 500 mg of elemental calcium  500 mg of elemental calcium Oral Q6H PRN Ambrose Finland, NP      . Margrett Rud darbepoetin (ARANESP) injection 25 mcg  25 mcg Intravenous Once in dialysis Alvia Grove, PA-C   25 mcg at 11/08/12 [id number]  . doxazosin (CARDURA) tablet 8 mg  8 mg Oral BID Theodis Blaze, MD      . famotidine (PEPCID) tablet 20 mg  20 mg Oral Daily Theodis Blaze, MD   20 mg at 11/08/12 1633  . labetalol (NORMODYNE) tablet 600 mg  600 mg Oral BID Theodis Blaze, MD      . multivitamin (RENA-VIT) tablet 1 tablet  1 tablet Oral QHS Theodis Blaze, MD      . ondansetron [facility]) tablet 4 mg  4 mg Oral Q6H PRN Kathie Dike, MD       Or  . ondansetron (ZOFRAN) injection 4 mg  4 mg Intravenous Q6H PRN Kathie Dike, MD      . [COMPLETED] paricalcitol (ZEMPLAR) injection 9 mcg  9 mcg Intravenous Once in  dialysis Alvia Grove, PA-C   9 mcg at 11/08/12 0839  . piperacillin-tazobactam (ZOSYN) IVPB 2.25 g  2.25 g Intravenous Q8H Blanchie Dessert, MD   2.25 g at 11/08/12 1630  . predniSONE (DELTASONE) tablet 5 mg  5 mg Oral Daily Theodis Blaze, MD   5 mg at 11/08/12 1633  . simvastatin (ZOCOR) tablet 10 mg  10 mg Oral q1800 Theodis Blaze, MD   10 mg at 11/08/12 1827  . sodium chloride 0.9 % injection 3 mL  3 mL Intravenous Q12H Kathie Dike, MD   3 mL at 11/08/12 1334  . zolpidem (AMBIEN) tablet 5 mg  5 mg Oral QHS PRN  Ambrose Finland, NP   5 mg at 11/07/12 2215  . [DISCONTINUED] hydrocortisone sodium succinate (SOLU-CORTEF) 100 mg/2 mL injection 50 mg  50 mg Intravenous Q8H Kathie Dike, MD   50 mg at 11/08/12 1346  . [DISCONTINUED] metoprolol (LOPRESSOR) injection 5 mg  5 mg Intravenous Q6H Kathie Dike, MD   5 mg at 11/08/12 1342  . [DISCONTINUED] zolpidem (AMBIEN) tablet 5 mg  5 mg Oral QHS PRN Theodis Blaze, MD        OBJECTIVE: Middle-aged African American woman in no acute distress Filed Vitals:   11/08/12 1710  BP: 138/79  Pulse: 86  Temp: 97.8 F (36.6 C)  Resp: 17     There is no height on file to calculate BMI.    ECOG FS: 1  Sclerae unicteric Oropharynx clear No cervical or supraclavicular adenopathy Lungs no rales or rhonchi, fair excursion bilaterally Heart regular rate and rhythm Abd soft, nontender (including right upper quadrant), positive bowel sounds, no palpable splenomegaly MSK no focal spinal tenderness, fistula in the left upper extremity, with good thrill Neuro: nonfocal Breasts: Deferred   LAB RESULTS: Lab Results  Component Value Date   WBC 5.7 11/08/2012   NEUTROABS 3.3 11/08/2012   HGB 10.3* 11/08/2012   HCT 31.1* 11/08/2012   MCV 92.8 11/08/2012   PLT 42* 11/08/2012    @LASTCHEMISTRY @  No results found for this basename: LABCA2    No components found with this basename: LABCA125     Lab 11/07/12 0647  INR 1.08    Urinalysis      Component Value Date/Time   COLORURINE YELLOW 01/30/2011 1648   APPEARANCEUR CLOUDY* 01/30/2011 1648   LABSPEC 1.016 01/30/2011 1648   PHURINE 5.0 01/30/2011 1648   GLUCOSEU NEGATIVE 10/14/2009 0747   HGBUR SMALL* 01/30/2011 1648   BILIRUBINUR NEGATIVE 01/30/2011 1648   KETONESUR NEGATIVE 01/30/2011 1648   PROTEINUR 100* 01/30/2011 1648   UROBILINOGEN 0.2 01/30/2011 1648   NITRITE NEGATIVE 01/30/2011 1648   LEUKOCYTESUR SMALL* 01/30/2011 1648    STUDIES: US Abdomen Complete  11/07/2012  *RADIOLOGY REPORT*  Clinical Data:  Abdominal pain and fever.  ABDOMINAL ULTRASOUND COMPLETE  Comparison:  Renal ultrasound performed 03/04/2008, and MRI of the pelvis performed 08/19/2012  Findings:  Gallbladder: The gallbladder wall is thickened and edematous, measuring up to 1.0 cm in thickness.  A 5 mm polyp is noted near the base of the gallbladder.  No significant pericholecystic fluid is seen; a positive ultrasonographic Murphy's sign is elicited.  Common Bile Duct:  0.6 cm in diameter; within normal limits in caliber.  Liver:  Normal parenchymal echogenicity and echotexture; no focal lesions identified.  Limited Doppler evaluation demonstrates normal blood flow within the liver.  IVC:  Unremarkable in appearance.  Pancreas:  Although the pancreas is difficult to visualize in its entirety due to overlying bowel gas, no focal pancreatic abnormality is identified.  Spleen:  12.9 cm in length; measures 348 mL in volume, within normal limits, with scattered calcified granulomata.  Right kidney:  5.8 cm in length; diffusely atrophic, with scattered small cysts.  Left kidney:  6.7 cm in length; diffusely atrophic, with scattered small cysts.  Abdominal Aorta:  Normal in caliber; no aneurysm identified. Plaque is noted along the course of the abdominal aorta.  Trace ascites is noted.  A small right pleural effusion is seen.  The patient's transplant kidney, noted within the pelvis, measures 10.1 cm in length, and demonstrates  very mild hydronephrosis.  IMPRESSION:  1.  Diffusely thickened and edematous gallbladder wall, measuring up to 1.0 cm in thickness, with a positive ultrasonographic Murphy's sign; this raises concern for cholecystitis.  No definite stones identified on ultrasound; this could conceivably reflect acalculous cholecystitis.  Suggest clinical correlation. 2.  Apparent 5 mm polyp noted at the base of the gallbladder. 3.  Diffusely atrophic native kidneys, reflecting the patient's chronic renal failure. 4.  Very mild hydronephrosis noted with respect to the patient's transplant kidney in the pelvis.   Original Report Authenticated By: Santa Lighter, M.D.    Dg Chest Port 1 View  11/06/2012  *RADIOLOGY REPORT*  Clinical Data: Fever, shortness of breath and chest pain.  PORTABLE CHEST - 1 VIEW  Comparison: Chest x-ray 11/15/2009.  Findings: Interval development of the interstitial prominence and ill-defined air space disease in the right base.  This does not obscure either the right hemidiaphragm or the right heart border. Fine pattern of peripheral micronodularity in the upper lobes of the lungs bilaterally are unchanged compared to prior examinations. No definite pleural effusions.  Pulmonary vasculature is normal. Heart size is mildly enlarged.  Mediastinal contours are unremarkable.  Atherosclerosis of the thoracic aorta.  Old healed fracture of the posterolateral aspect of the left seventh rib is again noted.  Orthopedic fixation hardware in the mid cervical spine incompletely visualized.  IMPRESSION: 1.  New ill-defined interstitial and airspace opacity in the right base, favored to be in the right lower lobe, concerning for bronchopneumonia. 2.  Radiographic appearance of the chest is otherwise essentially unchanged compared to prior examinations, as above.   Original Report Authenticated By: Vinnie Langton, M.D.     ASSESSMENT: 61 y.o. [place] woman with moderate thrombocytopenia in the setting of ESRD  and acalculous cholecystitis.  I reviewed the patient's blood film and there are no schistocytes, making HUS, TTP, or DIC very unlikely. I do not know if the patient's thrombocytopenia is chronic or acute. If chronic it could be related to clotting during dialysis. If acute it could be due to medications, or bleeding (the patient likely has limited marrow reserve and may have trouble keeping up with increased platelet utilization). The patient tells me she has been previously evaluated for lupus of, with negative results, and I have not repeated those labs. She also had a bone marrow biopsy for evaluation of anemia 09/11/2011, showing a normocellular bone marrow with 3% plasma cells and increased iron stores. There was no evidence of leukemia or myelodysplasia then, and there is non-by inspection of the blood film today.  PLAN: The patient is currently clinically improved and eager to go home. I am repeating a CBC in the morning, but if there is no further drop in her platelet count I would not disagree with a decision to send her home under close followup. Close followup is of course built in since she needs hemodialysis Tuesdays, Thursdays and Saturdays, and a CBC can be obtained at each of those visits. The question of timing for her cholecystectomy can be reevaluated in a nonurgent setting.  If the patient is discharged please request from [facility] that they fax me her CBC results [phone]) for the next 3 weeks. Otherwise I will follow with you during this hospitalization.   MAGRINAT,GUSTAV C    11/08/2012

## 2012-11-08 NOTE — Progress Notes (Signed)
Patient ID: [name], female   DOB: June 22, 1951, 61 y.o.   MRN: [id number]    Subjective: Pt feels like her RUQ pain is better, but still present.  Started having bloody BMs over night per patient.   Objective: Vital signs in last 24 hours: Temp:  [97.6 F (36.4 C)-98.3 F (36.8 C)] 98 F (36.7 C) (11/26 0650) Pulse Rate:  [63-86] 76  (11/26 0900) Resp:  [12-24] 19  (11/26 0900) BP: (124-185)/(73-96) 133/86 mmHg (11/26 0900) SpO2:  [95 %-100 %] 99 % (11/26 0650) Weight:  [137 lb 12.6 oz (62.5 kg)-139 lb 14.4 oz (63.458 kg)] 137 lb 12.6 oz (62.5 kg) (11/26 0650) Last BM Date: 11/08/12  Intake/Output from previous day: 11/25 0701 - 11/26 0700 In: 70 [P.O.:360; IV Piggyback:100] Out: 5 [Stool:5] Intake/Output this shift:    PE: Abd: soft, still tender in RUQ, +BS, unable to examine rectum secondary to patient in HD right now.  Lab Results:   [facility] 11/08/12 11/07/12 0647  WBC 7.7 9.7  HGB 9.7* 10.8*  HCT 28.9* 33.2*  PLT 40* 50*   BMET  Basename 11/07/12 0647 11/06/12 2113  NA 134* 137  K 4.1 3.9  CL 92* 95*  CO2 24 26  GLUCOSE 102* 108*  BUN 37* 31*  CREATININE 8.40* 7.32*  CALCIUM 9.4 9.6   PT/INR  Basename 11/07/12 0647  LABPROT 13.9  INR 1.08   CMP     Component Value Date/Time   NA 134* 11/07/2012 0647   K 4.1 11/07/2012 0647   CL 92* 11/07/2012 0647   CO2 24 11/07/2012 0647   GLUCOSE 102* 11/07/2012 0647   BUN 37* 11/07/2012 0647   CREATININE 8.40* 11/07/2012 0647   CALCIUM 9.4 11/07/2012 0647   CALCIUM PENDING 08/03/2011 1005   CALCIUM 9.6 08/03/2011 1005   PROT 6.7 11/07/2012 0647   ALBUMIN 2.7* 11/07/2012 0647   AST 55* 11/07/2012 0647   ALT 30 11/07/2012 0647   ALKPHOS 97 11/07/2012 0647   BILITOT 0.4 11/07/2012 0647   GFRNONAA 5* 11/07/2012 0647   GFRAA 5* 11/07/2012 0647   Lipase     Component Value Date/Time   LIPASE 31 03/04/2008 1625       Studies/Results: US Abdomen Complete  11/07/2012  *RADIOLOGY REPORT*   Clinical Data:  Abdominal pain and fever.  ABDOMINAL ULTRASOUND COMPLETE  Comparison:  Renal ultrasound performed 03/04/2008, and MRI of the pelvis performed 08/19/2012  Findings:  Gallbladder: The gallbladder wall is thickened and edematous, measuring up to 1.0 cm in thickness.  A 5 mm polyp is noted near the base of the gallbladder.  No significant pericholecystic fluid is seen; a positive ultrasonographic Murphy's sign is elicited.  Common Bile Duct:  0.6 cm in diameter; within normal limits in caliber.  Liver:  Normal parenchymal echogenicity and echotexture; no focal lesions identified.  Limited Doppler evaluation demonstrates normal blood flow within the liver.  IVC:  Unremarkable in appearance.  Pancreas:  Although the pancreas is difficult to visualize in its entirety due to overlying bowel gas, no focal pancreatic abnormality is identified.  Spleen:  12.9 cm in length; measures 348 mL in volume, within normal limits, with scattered calcified granulomata.  Right kidney:  5.8 cm in length; diffusely atrophic, with scattered small cysts.  Left kidney:  6.7 cm in length; diffusely atrophic, with scattered small cysts.  Abdominal Aorta:  Normal in caliber; no aneurysm identified. Plaque is noted along the course of the abdominal aorta.  Trace ascites is  noted.  A small right pleural effusion is seen.  The patient's transplant kidney, noted within the pelvis, measures 10.1 cm in length, and demonstrates very mild hydronephrosis.  IMPRESSION:  1.  Diffusely thickened and edematous gallbladder wall, measuring up to 1.0 cm in thickness, with a positive ultrasonographic Murphy's sign; this raises concern for cholecystitis.  No definite stones identified on ultrasound; this could conceivably reflect acalculous cholecystitis.  Suggest clinical correlation. 2.  Apparent 5 mm polyp noted at the base of the gallbladder. 3.  Diffusely atrophic native kidneys, reflecting the patient's chronic renal failure. 4.  Very mild  hydronephrosis noted with respect to the patient's transplant kidney in the pelvis.   Original Report Authenticated By: Santa Lighter, M.D.    Dg Chest Port 1 View  11/06/2012  *RADIOLOGY REPORT*  Clinical Data: Fever, shortness of breath and chest pain.  PORTABLE CHEST - 1 VIEW  Comparison: Chest x-ray 11/15/2009.  Findings: Interval development of the interstitial prominence and ill-defined air space disease in the right base.  This does not obscure either the right hemidiaphragm or the right heart border. Fine pattern of peripheral micronodularity in the upper lobes of the lungs bilaterally are unchanged compared to prior examinations. No definite pleural effusions.  Pulmonary vasculature is normal. Heart size is mildly enlarged.  Mediastinal contours are unremarkable.  Atherosclerosis of the thoracic aorta.  Old healed fracture of the posterolateral aspect of the left seventh rib is again noted.  Orthopedic fixation hardware in the mid cervical spine incompletely visualized.  IMPRESSION: 1.  New ill-defined interstitial and airspace opacity in the right base, favored to be in the right lower lobe, concerning for bronchopneumonia. 2.  Radiographic appearance of the chest is otherwise essentially unchanged compared to prior examinations, as above.   Original Report Authenticated By: Vinnie Langton, M.D.     Anti-infectives: Anti-infectives     Start     Dose/Rate Route Frequency Ordered Stop   11/07/12 0800  piperacillin-tazobactam (ZOSYN) IVPB 2.25 g       2.25 g 100 mL/hr over 30 Minutes Intravenous Every 8 hours 11/07/12 0219     11/07/12 0200  piperacillin-tazobactam (ZOSYN) IVPB 2.25 g       2.25 g 100 mL/hr over 30 Minutes Intravenous NOW 11/07/12 0157 11/07/12 0255           Assessment/Plan  1. Acute acalculous cholecystitis 2. Thrombocytopenia, unknown etiology, never seen this happen due to infection from cholecystitis 3. ESRD, HD 4. BRBPR, per pt  Plan: 1. Due to patient's  worsening thrombocytopenia, we will defer surgical intervention at this time.  We thought about placing a percutaneous cholecystostomy drain; however, when doing this, most of the time, it is placed through the liver which will obviously also put her at high risk for bleeding complications.  We will d/w IR.  In the meantime, we will hold off on this and continue IV antibiotic therapies. 2. I will call Dr. Doyle Askew and let her know about the bloody BMs. 3. Agree with hematology consult to work up thrombocytopenia of undetermined etiology.   LOS: 2 days    Angelito Hopping E 11/08/2012, 9:19 AM Pager: [phone]

## 2012-11-08 NOTE — Progress Notes (Signed)
Events noted. plt count now 40. Had some mild bleeding episodes Denies abd pain, n/v.  Alert, nad, in HD Soft, obese, some TTP in RUQ. No RT/guarding/peritonitis  Acalculous cholecystitis Continue iv abx Would rec ongoing medical mgmt given decreasing plt count of unclear etiology Clinically she is doing ok (no fever, no wbc, with out complaints of worsening pain) Would off on cholecystostomy tube for now given plt count - will re-evaluate cholecystostomy tube if worsens  Leighton Ruff. Redmond Pulling, MD, FACS General, Bariatric, & Minimally Invasive Surgery [facility], [place]

## 2012-11-09 DIAGNOSIS — D696 Thrombocytopenia, unspecified: Secondary | ICD-10-CM | POA: Diagnosis not present

## 2012-11-09 DIAGNOSIS — K219 Gastro-esophageal reflux disease without esophagitis: Secondary | ICD-10-CM

## 2012-11-09 DIAGNOSIS — N039 Chronic nephritic syndrome with unspecified morphologic changes: Secondary | ICD-10-CM | POA: Diagnosis not present

## 2012-11-09 DIAGNOSIS — K819 Cholecystitis, unspecified: Secondary | ICD-10-CM | POA: Diagnosis not present

## 2012-11-09 DIAGNOSIS — I10 Essential (primary) hypertension: Secondary | ICD-10-CM

## 2012-11-09 DIAGNOSIS — D649 Anemia, unspecified: Secondary | ICD-10-CM | POA: Diagnosis not present

## 2012-11-09 DIAGNOSIS — D631 Anemia in chronic kidney disease: Secondary | ICD-10-CM | POA: Diagnosis not present

## 2012-11-09 DIAGNOSIS — I120 Hypertensive chronic kidney disease with stage 5 chronic kidney disease or end stage renal disease: Secondary | ICD-10-CM | POA: Diagnosis not present

## 2012-11-09 DIAGNOSIS — N186 End stage renal disease: Secondary | ICD-10-CM | POA: Diagnosis not present

## 2012-11-09 DIAGNOSIS — K810 Acute cholecystitis: Secondary | ICD-10-CM | POA: Diagnosis not present

## 2012-11-09 LAB — BASIC METABOLIC PANEL
Calcium: 9.1 mg/dL (ref 8.4–10.5)
GFR calc non Af Amer: 7 mL/min — ABNORMAL LOW (ref >90)
Sodium: 134 mEq/L — ABNORMAL LOW (ref 135–145)

## 2012-11-09 LAB — FERRITIN: Ferritin: 29496 ng/mL — ABNORMAL HIGH (ref 10–291)

## 2012-11-09 LAB — CBC
MCH: 30.7 pg (ref 26.0–34.0)
MCHC: 33.2 g/dL (ref 30.0–36.0)
Platelets: 35 10*3/uL — ABNORMAL LOW (ref 150–400)
RDW: 17.4 % — ABNORMAL HIGH (ref 11.5–15.5)

## 2012-11-09 LAB — GLUCOSE, CAPILLARY: Glucose-Capillary: 109 mg/dL — ABNORMAL HIGH (ref 70–99)

## 2012-11-09 MED ORDER — CALCIUM CARBONATE 1250 MG/5ML PO SUSP: 500.0000 mg | Freq: Four times a day (QID) | ORAL | Status: DC | PRN

## 2012-11-09 MED ORDER — METRONIDAZOLE 250 MG PO TABS: 250.0000 mg | ORAL_TABLET | Freq: Three times a day (TID) | ORAL | Status: DC

## 2012-11-09 MED ORDER — ALLOPURINOL 150 MG HALF TABLET: 150.0000 mg | ORAL_TABLET | Freq: Every day | ORAL | Status: DC

## 2012-11-09 MED ORDER — METRONIDAZOLE 500 MG PO TABS: 500.0000 mg | ORAL_TABLET | Freq: Three times a day (TID) | ORAL | Status: DC

## 2012-11-09 MED ORDER — LABETALOL HCL 300 MG PO TABS: 600.0000 mg | ORAL_TABLET | Freq: Two times a day (BID) | ORAL | Status: DC

## 2012-11-09 MED ORDER — METRONIDAZOLE 500 MG PO TABS
500.0000 mg | ORAL_TABLET | Freq: Three times a day (TID) | ORAL | Status: DC
  Filled 2012-11-09 (×3): qty 1

## 2012-11-09 MED ORDER — CIPROFLOXACIN HCL 500 MG PO TABS: 500.0000 mg | ORAL_TABLET | Freq: Every day | ORAL | Status: DC

## 2012-11-09 MED ORDER — CIPROFLOXACIN HCL 500 MG PO TABS
500.0000 mg | ORAL_TABLET | Freq: Every day | ORAL | Status: DC
  Administered 2012-11-09: 500 mg via ORAL
  Filled 2012-11-09: qty 1

## 2012-11-09 NOTE — Progress Notes (Signed)
  Subjective: Patient reports minimal abdominal discomfort Tolerating liquids +BM's  Objective: Vital signs in last 24 hours: Temp:  [96.9 F (36.1 C)-98.2 F (36.8 C)] 97.5 F (36.4 C) (11/27 0533) Pulse Rate:  [65-86] 74  (11/27 0533) Resp:  [13-22] 18  (11/27 0533) BP: (133-185)/(77-96) 146/86 mmHg (11/27 0533) SpO2:  [97 %-100 %] 100 % (11/27 0533) Weight:  [134 lb 11.2 oz (61.1 kg)-135 lb 9.3 oz (61.5 kg)] 134 lb 11.2 oz (61.1 kg) (11/26 2137) Last BM Date: 11/08/12  Intake/Output from previous day: 11/26 0701 - 11/27 0700 In: 870 [P.O.:720; IV Piggyback:150] Out: 1058  Intake/Output this shift:    Soft, almost non tender abdomen with no guarding  Lab Results:   Basename 11/09/12 0525 11/08/12 1426  WBC 5.2 5.7  HGB 9.4* 10.3*  HCT 28.3* 31.1*  PLT 35* 42*   BMET  Basename 11/09/12 0525 11/08/12 1426  NA 134* 136  K 3.3* 3.7  CL 91* 92*  CO2 27 30  GLUCOSE 100* 90  BUN 37* 21  CREATININE 5.78* 4.26*  CALCIUM 9.1 8.6   PT/INR  Basename 11/07/12 0647  LABPROT 13.9  INR 1.08   ABG No results found for this basename: PHART:2,PCO2:2,PO2:2,HCO3:2 in the last 72 hours  Studies/Results: No results found.  Anti-infectives: Anti-infectives     Start     Dose/Rate Route Frequency Ordered Stop   11/07/12 0800   piperacillin-tazobactam (ZOSYN) IVPB 2.25 g        2.25 g 100 mL/hr over 30 Minutes Intravenous Every 8 hours 11/07/12 0219     11/07/12 0200   piperacillin-tazobactam (ZOSYN) IVPB 2.25 g        2.25 g 100 mL/hr over 30 Minutes Intravenous NOW 11/07/12 0157 11/07/12 0255          Assessment/Plan:  Acalculous cholecystitis and thrombocytopenia  plt's down further.  Given that along with her clinical improvement, no plans for lap chole or even perc drain until platelets are up.  I think it is reasonable to transition to oral antibiotics and follow up as an outpatient given her abd exam.  LOS: 3 days    [name]  A 11/09/2012

## 2012-11-09 NOTE — Discharge Summary (Signed)
Physician Discharge Summary  [name] [id number] DOB: 04-Jan-1951 DOA: 11/06/2012  PCP: Estanislado Emms, MD  Admit date: 11/06/2012 Discharge date: 11/09/2012  Time spent: > 35 minutes  Recommendations for Outpatient Follow-up:  1. Please be sure to follow up with your primary care physician in 1-2 weeks or sooner should you have any new concerns 2. Also follow up with platelet counts. 3. Encourage low fat diets. 4. Monitor blood pressures 5. Patient will be discharged on cipro and metronidazole for a total course of outpatient therapy of 7 days.  Discharge Diagnoses:  Principal Problem:  *Cholecystitis, acute Active Problems:  ESRD (end stage renal disease)  HTN (hypertension)  GERD (gastroesophageal reflux disease)  Anemia  Thrombocytopenia   Discharge Condition: Stable  Diet recommendation: Low fat diet  Filed Weights   11/08/12 0650 11/08/12 1132 11/08/12 2137  Weight: 62.5 kg (137 lb 12.6 oz) 61.5 kg (135 lb 9.3 oz) 61.1 kg (134 lb 11.2 oz)    History of present illness:  From original HPI: [name] is a 61 y.o. female with past medical history of end-stage renal disease on hemodialysis, failed renal transplant, chronic steroid therapy, hypertension. Patient was in her usual state of health when last Wednesday she starts to develop onset of diarrhea, and nausea. The following day she began having high fevers of 103. She went to her dialysis unit on Saturday and was given one dose of vancomycin, as well as another antibiotic. Blood cultures were sent at that time. Patient was discharged home. She began having right upper quadrant pain which is progressively worse. She denies any shortness of breath, chest pain. She does feel that she has a bit of a cough. She was increasingly weak and dizzy. He was evaluated in the emergency room the right upper quadrant ultrasound indicated acute cholecystitis. Patient has been referred for admission.  Hospital Course:    Cholecystitis, acute  - While in house patient was followed by surgical team.  They're plan is as follows: Acalculous cholecystitis and thrombocytopenia  plt's down further. Given that along with her clinical improvement, no plans for lap chole or even perc drain until platelets are up. I think it is reasonable to transition to oral antibiotics and follow up as an outpatient given her abd exam.  - Will d/c on cipro and flagyl for 7 more days as outpatient.  Active Problems:  ESRD (end stage renal disease)  - nephrology team followed while patient in house.  Discussed case with Nephro PA and arrangements will be made to fax CBC results to Oncologist (Dr. Jana Hakim at fax number provided by him on his last note)  HTN (hypertension)  - Will plan on continuing home regimen.  Last blood pressure 118/75.  GERD (gastroesophageal reflux disease)  -  Given thrombocytopenia will plan on holding pepcid as outpatient.  Will continue her calcium carbonate though.  Anemia of chronic disease  - Will plan on continuing to monitor as outpatient during dialysis sessions.  - FOBT was negative   Thrombocytopenia  - new finding on this admission and of unclear etiology  - Hematology aware and on board and will follow as outpatient.  CBC to be faxed to his office for further recommendations. - Zosyn discontinued today 11/09/12 and patient will be started on new regimen.  Suspect this may be secondary to h/o HD and possibly medication induced (zosyn).  Patient aware that she is to continue to get this monitored and will follow up with Dr. Jana Hakim.  Procedures:  HD  Consultations:  Nephrology  Oncology  General surgery.  Discharge Exam: Filed Vitals:   11/08/12 2137 11/09/12 0533 11/09/12 0900 11/09/12 0923  BP: 156/88 146/86  118/75  Pulse: 66 74  77  Temp: 98 F (36.7 C) 97.5 F (36.4 C)  97.3 F (36.3 C)  TempSrc: Oral Oral  Oral  Resp: 18 18  20   Height:   5\' 2"  (1.575 m)   Weight:  61.1 kg (134 lb 11.2 oz)     SpO2: 100% 100%  100%    General: Pt in NAD, Alert and Awake. Smiling Cardiovascular: RRR, No MRG Respiratory: CTA BL, no increased work of breathing Abdomen: soft, NT, ND  Discharge Instructions  Discharge Orders    Future Orders Please Complete By Expires   Diet - low sodium heart healthy      Increase activity slowly      Discharge instructions      Comments:   Discussed with [facility] so they fax Oncology Dr. Jana Hakim her CBC results [phone]) for the next 3 weeks.  You are to eat a low fat diet.  Continue your routine dialysis as outlined by your nephrologist while in house.    Finish your antibiotic regimen.  You will have to complete a 7 day course of flagyl and cipro.   Call MD for:  temperature >100.4      Call MD for:  persistant nausea and vomiting      Call MD for:  severe uncontrolled pain      Call MD for:  difficulty breathing, headache or visual disturbances          Medication List     As of 11/09/2012 10:35 AM    STOP taking these medications         famotidine 20 MG tablet   Commonly known as: PEPCID      TAKE these medications         allopurinol 150 mg Tabs   Commonly known as: ZYLOPRIM   Take 0.5 tablets (150 mg total) by mouth at bedtime.      b complex-vitamin c-folic acid 0.8 MG Tabs   Take 0.8 mg by mouth daily.      calcium carbonate (dosed in mg elemental calcium) 1250 MG/5ML   Take 5 mLs (500 mg of elemental calcium total) by mouth every 6 (six) hours as needed for heartburn.      ciprofloxacin 500 MG tablet   Commonly known as: CIPRO   Take 1 tablet (500 mg total) by mouth daily with supper.   Start taking on: 11/10/2012      doxazosin 8 MG tablet   Commonly known as: CARDURA   Take 8 mg by mouth 2 (two) times daily.      HYDROcodone-acetaminophen 5-500 MG per tablet   Commonly known as: VICODIN   Take 1 tablet by mouth every 6 (six) hours as needed. Back pain      labetalol 300 MG tablet    Commonly known as: NORMODYNE   Take 2 tablets (600 mg total) by mouth 2 (two) times daily.      metroNIDAZOLE 500 MG tablet   Commonly known as: FLAGYL   Take 1 tablet (500 mg total) by mouth every 8 (eight) hours.   Start taking on: 11/10/2012      pravastatin 20 MG tablet   Commonly known as: PRAVACHOL   Take 20 mg by mouth daily.      predniSONE 5  MG tablet   Commonly known as: DELTASONE   Take 5 mg by mouth daily.      promethazine 25 MG tablet   Commonly known as: PHENERGAN   Take 25 mg by mouth every 6 (six) hours as needed. nausea      zolpidem 5 MG tablet   Commonly known as: AMBIEN   Take 5 mg by mouth at bedtime as needed. For insomnia          The results of significant diagnostics from this hospitalization (including imaging, microbiology, ancillary and laboratory) are listed below for reference.    Significant Diagnostic Studies: US Abdomen Complete  11/07/2012  *RADIOLOGY REPORT*  Clinical Data:  Abdominal pain and fever.  ABDOMINAL ULTRASOUND COMPLETE  Comparison:  Renal ultrasound performed 03/04/2008, and MRI of the pelvis performed 08/19/2012  Findings:  Gallbladder: The gallbladder wall is thickened and edematous, measuring up to 1.0 cm in thickness.  A 5 mm polyp is noted near the base of the gallbladder.  No significant pericholecystic fluid is seen; a positive ultrasonographic Murphy's sign is elicited.  Common Bile Duct:  0.6 cm in diameter; within normal limits in caliber.  Liver:  Normal parenchymal echogenicity and echotexture; no focal lesions identified.  Limited Doppler evaluation demonstrates normal blood flow within the liver.  IVC:  Unremarkable in appearance.  Pancreas:  Although the pancreas is difficult to visualize in its entirety due to overlying bowel gas, no focal pancreatic abnormality is identified.  Spleen:  12.9 cm in length; measures 348 mL in volume, within normal limits, with scattered calcified granulomata.  Right kidney:  5.8 cm in  length; diffusely atrophic, with scattered small cysts.  Left kidney:  6.7 cm in length; diffusely atrophic, with scattered small cysts.  Abdominal Aorta:  Normal in caliber; no aneurysm identified. Plaque is noted along the course of the abdominal aorta.  Trace ascites is noted.  A small right pleural effusion is seen.  The patient's transplant kidney, noted within the pelvis, measures 10.1 cm in length, and demonstrates very mild hydronephrosis.  IMPRESSION:  1.  Diffusely thickened and edematous gallbladder wall, measuring up to 1.0 cm in thickness, with a positive ultrasonographic Murphy's sign; this raises concern for cholecystitis.  No definite stones identified on ultrasound; this could conceivably reflect acalculous cholecystitis.  Suggest clinical correlation. 2.  Apparent 5 mm polyp noted at the base of the gallbladder. 3.  Diffusely atrophic native kidneys, reflecting the patient's chronic renal failure. 4.  Very mild hydronephrosis noted with respect to the patient's transplant kidney in the pelvis.   Original Report Authenticated By: Santa Lighter, M.D.    Dg Chest Port 1 View  11/06/2012  *RADIOLOGY REPORT*  Clinical Data: Fever, shortness of breath and chest pain.  PORTABLE CHEST - 1 VIEW  Comparison: Chest x-ray 11/15/2009.  Findings: Interval development of the interstitial prominence and ill-defined air space disease in the right base.  This does not obscure either the right hemidiaphragm or the right heart border. Fine pattern of peripheral micronodularity in the upper lobes of the lungs bilaterally are unchanged compared to prior examinations. No definite pleural effusions.  Pulmonary vasculature is normal. Heart size is mildly enlarged.  Mediastinal contours are unremarkable.  Atherosclerosis of the thoracic aorta.  Old healed fracture of the posterolateral aspect of the left seventh rib is again noted.  Orthopedic fixation hardware in the mid cervical spine incompletely visualized.   IMPRESSION: 1.  New ill-defined interstitial and airspace opacity in the right base,  favored to be in the right lower lobe, concerning for bronchopneumonia. 2.  Radiographic appearance of the chest is otherwise essentially unchanged compared to prior examinations, as above.   Original Report Authenticated By: Vinnie Langton, M.D.     Microbiology: Recent Results (from the past 240 hour(s))  MRSA PCR SCREENING     Status: Normal   Collection Time   11/07/12  3:59 AM      Component Value Range Status Comment   MRSA by PCR NEGATIVE  NEGATIVE Final   CULTURE, BLOOD (ROUTINE X 2)     Status: Normal (Preliminary result)   Collection Time   11/07/12  6:47 AM      Component Value Range Status Comment   Specimen Description BLOOD RIGHT ARM   Final    Special Requests BOTTLES DRAWN AEROBIC AND ANAEROBIC 10CC   Final    Culture  Setup Time 11/07/2012 16:28   Final    Culture     Final    Value:        BLOOD CULTURE RECEIVED NO GROWTH TO DATE CULTURE WILL BE HELD FOR 5 DAYS BEFORE ISSUING A FINAL NEGATIVE REPORT   Report Status PENDING   Incomplete   CULTURE, BLOOD (ROUTINE X 2)     Status: Normal (Preliminary result)   Collection Time   11/07/12  7:05 AM      Component Value Range Status Comment   Specimen Description BLOOD RIGHT FOREARM   Final    Special Requests     Final    Value: BOTTLES DRAWN AEROBIC AND ANAEROBIC AERO 6CC ANA 5CC   Culture  Setup Time 11/07/2012 16:28   Final    Culture     Final    Value:        BLOOD CULTURE RECEIVED NO GROWTH TO DATE CULTURE WILL BE HELD FOR 5 DAYS BEFORE ISSUING A FINAL NEGATIVE REPORT   Report Status PENDING   Incomplete      Labs: Basic Metabolic Panel:  Lab 0000000 0525 11/08/12 1426 11/07/12 0647 11/06/12 2113  NA 134* 136 134* 137  K 3.3* 3.7 4.1 3.9  CL 91* 92* 92* 95*  CO2 27 30 24 26   GLUCOSE 100* 90 102* 108*  BUN 37* 21 37* 31*  CREATININE 5.78* 4.26* 8.40* 7.32*  CALCIUM 9.1 8.6 9.4 9.6  MG -- -- -- --  PHOS -- -- -- --    Liver Function Tests:  Lab 11/08/12 1426 11/07/12 0647 11/06/12 2113  AST 43* 55* 66*  ALT 25 30 35  ALKPHOS 88 97 103  BILITOT 0.5 0.4 0.4  PROT 7.2 6.7 6.9  ALBUMIN 2.9* 2.7* 2.8*   No results found for this basename: LIPASE:5,AMYLASE:5 in the last 168 hours No results found for this basename: AMMONIA:5 in the last 168 hours CBC:  Lab 11/09/12 0525 11/08/12 1426 11/08/12 11/07/12 0647 11/06/12 2113  WBC 5.2 5.7 7.7 9.7 9.6  NEUTROABS -- 3.3 -- -- 6.1  HGB 9.4* 10.3* 9.7* 10.8* 11.6*  HCT 28.3* 31.1* 28.9* 33.2* 35.7*  MCV 92.5 92.8 90.0 92.7 94.4  PLT 35* 42* 40* 50* 84*   Cardiac Enzymes:  Lab 11/08/12 0243  CKTOTAL --  CKMB --  CKMBINDEX --  TROPONINI <0.30   BNP: BNP (last 3 results) No results found for this basename: PROBNP:3 in the last 8760 hours CBG:  Lab 11/09/12 0134  GLUCAP 109*       Signed:  Velvet Bathe  [facility] 11/09/2012, 10:35 AM

## 2012-11-09 NOTE — Progress Notes (Signed)
[facility] Progress Note  Subjective:   Blood noted when blowing nose.  Drinking CL ok.  Objective Filed Vitals:   11/08/12 2137 11/09/12 0533 11/09/12 0900 11/09/12 0923  BP: 156/88 146/86  118/75  Pulse: 66 74  77  Temp: 98 F (36.7 C) 97.5 F (36.4 C)  97.3 F (36.3 C)  TempSrc: Oral Oral  Oral  Resp: 18 18  20   Height:   5\' 2"  (1.575 m)   Weight: 61.1 kg (134 lb 11.2 oz)     SpO2: 100% 100%  100%   Physical Exam General: tearful (missing husband who died 2 years ago) Heart: RRR Lungs: clear without wheezes or rales Abdomen: + BS, minimal tenderness Extremities: no LE edema Dialysis Access: tortuos left lower AVF patent 62 years old!  Assessment/Plan: 1. Acute acalculous cholecystitis - no surgery or perc until platelets increase.  Tolerating CL.  Plan to d/c on cipro and flagyl today. LFTs decreasing 2. Thrombocytopenia - platelets down; discussed with Dr. Wendee Beavers.  These will be followed serially at her dialysis center q tmt until stable and faxed to Dr. Jana Hakim [phone]. 2. ESRD - TTS - next HD at her outpt center; no heparin dialysis for now 3. Anemia -  Ferritin 29,496 - up from 991 in October. Likely inflammatory/?lab error; tsat 30%.  Increase Epo at discharge to 8000. 4. Secondary hyperparathyroidism - continue current meds at d/c 5. HTN/volume - ok. No change in EDW 6. Nutrition - needs a low fat diet   Myriam Jacobson, PA-C [facility] Beeper [phone] 11/09/2012,9:57 AM  LOS: 3 days    Additional Objective Labs: Basic Metabolic Panel:  Lab 0000000 0525 11/08/12 1426 11/07/12 0647  NA 134* 136 134*  K 3.3* 3.7 4.1  CL 91* 92* 92*  CO2 27 30 24   GLUCOSE 100* 90 102*  BUN 37* 21 37*  CREATININE 5.78* 4.26* 8.40*  CALCIUM 9.1 8.6 9.4  ALB -- -- --  PHOS -- -- --   Liver Function Tests:  Lab 11/08/12 1426 11/07/12 0647 11/06/12 2113  AST 43* 55* 66*  ALT 25 30 35  ALKPHOS 88 97 103  BILITOT 0.5 0.4 0.4  PROT  7.2 6.7 6.9  ALBUMIN 2.9* 2.7* 2.8*   CBC:  Lab 11/09/12 0525 11/08/12 1426 11/08/12 11/07/12 0647 11/06/12 2113  WBC 5.2 5.7 7.7 -- --  NEUTROABS -- 3.3 -- -- 6.1  HGB 9.4* 10.3* 9.7* -- --  HCT 28.3* 31.1* 28.9* -- --  MCV 92.5 92.8 90.0 92.7 94.4  PLT 35* 42* 40* -- --  Cardiac Enzymes:  Lab 11/08/12 0243  CKTOTAL --  CKMB --  CKMBINDEX --  TROPONINI <0.30   CBG:  Lab 11/09/12 0134  GLUCAP 109*   Iron Studies:  Basename 11/08/12 1426  IRON --  TIBC --  TRANSFERRIN --  FERRITIN 29562*  Medications:      . allopurinol  150 mg Oral QHS  . doxazosin  8 mg Oral BID  . famotidine  20 mg Oral Daily  . labetalol  600 mg Oral BID  . metroNIDAZOLE  500 mg Oral Q8H  . multivitamin  1 tablet Oral QHS  . predniSONE  5 mg Oral Daily  . simvastatin  10 mg Oral q1800  . sodium chloride  3 mL Intravenous Q12H  . [DISCONTINUED] hydrocortisone sod succinate (SOLU-CORTEF) injection  50 mg Intravenous Q8H  . [DISCONTINUED] metoprolol  5 mg Intravenous Q6H  . [DISCONTINUED] metroNIDAZOLE  250  mg Oral Q8H  . [DISCONTINUED] piperacillin-tazobactam (ZOSYN)  IV  2.25 g Intravenous Q8H   Renal Attending Still has pain but antibiotic management planned for now due to thrombocytopenia.  Cont HD as scheduled. Agree with eval and hx and plan as articulated above. Merinda Victorino C

## 2012-11-09 NOTE — Progress Notes (Signed)
Visited w/pt and family. Pt's sister met me in hallway and informed me pt was in hospital. Had prayer w/pt, family and attending nurse. Pt and family was grateful for [place] visit.  Ernest Haber Chaplain

## 2012-11-09 NOTE — Progress Notes (Addendum)
ANTIBIOTIC CONSULT NOTE - FOLLOW UP  Pharmacy Consult for Zosyn -> change to Cipro Indication: cholecystitis  Allergies  Allergen Reactions  . Sulfa Antibiotics     Both parents allergic-so will not take    Patient Measurements: Weight: 134 lb 11.2 oz (61.1 kg) (standing scale #2) Adjusted Body Weight: n/a  Vital Signs: Temp: 97.5 F (36.4 C) (11/27 0533) Temp src: Oral (11/27 0533) BP: 146/86 mmHg (11/27 0533) Pulse Rate: 74  (11/27 0533) Intake/Output from previous day: 11/26 0701 - 11/27 0700 In: 870 [P.O.:720; IV Piggyback:150] Out: 1058  Intake/Output from this shift:    Labs:  [facility] 11/09/12 0525 11/08/12 1426 11/08/12 11/07/12 0647  WBC 5.2 5.7 7.7 --  HGB 9.4* 10.3* 9.7* --  PLT 35* 42* 40* --  LABCREA -- -- -- --  CREATININE 5.78* 4.26* -- 8.40*   The CrCl is unknown because both a height and weight (above a minimum accepted value) are required for this calculation. No results found for this basename: VANCOTROUGH:2,VANCOPEAK:2,VANCORANDOM:2,GENTTROUGH:2,GENTPEAK:2,GENTRANDOM:2,TOBRATROUGH:2,TOBRAPEAK:2,TOBRARND:2,AMIKACINPEAK:2,AMIKACINTROU:2,AMIKACIN:2, in the last 72 hours   Microbiology: Recent Results (from the past 720 hour(s))  MRSA PCR SCREENING     Status: Normal   Collection Time   11/07/12  3:59 AM      Component Value Range Status Comment   MRSA by PCR NEGATIVE  NEGATIVE Final   CULTURE, BLOOD (ROUTINE X 2)     Status: Normal (Preliminary result)   Collection Time   11/07/12  6:47 AM      Component Value Range Status Comment   Specimen Description BLOOD RIGHT ARM   Final    Special Requests BOTTLES DRAWN AEROBIC AND ANAEROBIC 10CC   Final    Culture  Setup Time 11/07/2012 16:28   Final    Culture     Final    Value:        BLOOD CULTURE RECEIVED NO GROWTH TO DATE CULTURE WILL BE HELD FOR 5 DAYS BEFORE ISSUING A FINAL NEGATIVE REPORT   Report Status PENDING   Incomplete   CULTURE, BLOOD (ROUTINE X 2)     Status: Normal (Preliminary  result)   Collection Time   11/07/12  7:05 AM      Component Value Range Status Comment   Specimen Description BLOOD RIGHT FOREARM   Final    Special Requests     Final    Value: BOTTLES DRAWN AEROBIC AND ANAEROBIC AERO 6CC ANA 5CC   Culture  Setup Time 11/07/2012 16:28   Final    Culture     Final    Value:        BLOOD CULTURE RECEIVED NO GROWTH TO DATE CULTURE WILL BE HELD FOR 5 DAYS BEFORE ISSUING A FINAL NEGATIVE REPORT   Report Status PENDING   Incomplete     Anti-infectives     Start     Dose/Rate Route Frequency Ordered Stop   11/07/12 0800  piperacillin-tazobactam (ZOSYN) IVPB 2.25 g       2.25 g 100 mL/hr over 30 Minutes Intravenous Every 8 hours 11/07/12 0219     11/07/12 0200  piperacillin-tazobactam (ZOSYN) IVPB 2.25 g       2.25 g 100 mL/hr over 30 Minutes Intravenous NOW 11/07/12 0157 11/07/12 0255          Assessment: 61 yo female on ESRD admitted with acute cholecystitis.  Zosyn was started empirically on 11/25, at 2.25g IV q 8hrs, adjusted for renal dysfunction/HD.  Patient is clinically stable, pharmacy asked to change to po  Cipro today.  Goal of Therapy:  Resolution of infection  Plan:  1. Start Cipro 500 mg po q 24 hrs. 2. F/U length of planned antibiotic therapy.  Zaliah Wissner C 11/09/2012,8:49 AM

## 2012-11-10 DIAGNOSIS — D631 Anemia in chronic kidney disease: Secondary | ICD-10-CM | POA: Diagnosis not present

## 2012-11-10 DIAGNOSIS — D509 Iron deficiency anemia, unspecified: Secondary | ICD-10-CM | POA: Diagnosis not present

## 2012-11-10 DIAGNOSIS — R509 Fever, unspecified: Secondary | ICD-10-CM | POA: Diagnosis not present

## 2012-11-10 DIAGNOSIS — N2581 Secondary hyperparathyroidism of renal origin: Secondary | ICD-10-CM | POA: Diagnosis not present

## 2012-11-10 DIAGNOSIS — N186 End stage renal disease: Secondary | ICD-10-CM | POA: Diagnosis not present

## 2012-11-10 DIAGNOSIS — Z992 Dependence on renal dialysis: Secondary | ICD-10-CM | POA: Diagnosis not present

## 2012-11-12 DIAGNOSIS — N186 End stage renal disease: Secondary | ICD-10-CM | POA: Diagnosis not present

## 2012-11-12 DIAGNOSIS — N039 Chronic nephritic syndrome with unspecified morphologic changes: Secondary | ICD-10-CM | POA: Diagnosis not present

## 2012-11-12 DIAGNOSIS — R509 Fever, unspecified: Secondary | ICD-10-CM | POA: Diagnosis not present

## 2012-11-12 DIAGNOSIS — D631 Anemia in chronic kidney disease: Secondary | ICD-10-CM | POA: Diagnosis not present

## 2012-11-12 DIAGNOSIS — D509 Iron deficiency anemia, unspecified: Secondary | ICD-10-CM | POA: Diagnosis not present

## 2012-11-12 DIAGNOSIS — N2581 Secondary hyperparathyroidism of renal origin: Secondary | ICD-10-CM | POA: Diagnosis not present

## 2012-11-12 DIAGNOSIS — Z992 Dependence on renal dialysis: Secondary | ICD-10-CM | POA: Diagnosis not present

## 2012-11-13 LAB — CULTURE, BLOOD (ROUTINE X 2): Culture: NO GROWTH

## 2012-11-15 DIAGNOSIS — N2581 Secondary hyperparathyroidism of renal origin: Secondary | ICD-10-CM | POA: Diagnosis not present

## 2012-11-15 DIAGNOSIS — D509 Iron deficiency anemia, unspecified: Secondary | ICD-10-CM | POA: Diagnosis not present

## 2012-11-15 DIAGNOSIS — D631 Anemia in chronic kidney disease: Secondary | ICD-10-CM | POA: Diagnosis not present

## 2012-11-15 DIAGNOSIS — N186 End stage renal disease: Secondary | ICD-10-CM | POA: Diagnosis not present

## 2012-11-15 NOTE — ED Provider Notes (Signed)
I saw and evaluated the patient, reviewed the resident's note and I agree with the findings and plan. Pt with prior renal transplant that has failed and back on dialysis.  No resp sx and today c/o of abd pain with significant RUQ tenderness and murphy's sign.  Concern for gallbladder pathology and U/S confirmative.  Stable condition and zosyn started.  Blanchie Dessert, MD 11/15/12 [phone]

## 2012-11-25 DIAGNOSIS — Z1231 Encounter for screening mammogram for malignant neoplasm of breast: Secondary | ICD-10-CM | POA: Diagnosis not present

## 2012-11-28 ENCOUNTER — Encounter (HOSPITAL_COMMUNITY): Payer: Self-pay | Admitting: *Deleted

## 2012-11-28 ENCOUNTER — Observation Stay (HOSPITAL_COMMUNITY): Payer: Medicare Other

## 2012-11-28 ENCOUNTER — Emergency Department (HOSPITAL_COMMUNITY): Payer: Medicare Other

## 2012-11-28 ENCOUNTER — Inpatient Hospital Stay (HOSPITAL_COMMUNITY)
Admission: EM | Admit: 2012-11-28 | Discharge: 2012-12-04 | DRG: 987 | Disposition: A | Discharge status: Home / Self Care | Payer: Medicare Other | Source: Ambulatory Visit | Attending: Family Medicine | Admitting: Family Medicine

## 2012-11-28 DIAGNOSIS — D649 Anemia, unspecified: Secondary | ICD-10-CM

## 2012-11-28 DIAGNOSIS — F329 Major depressive disorder, single episode, unspecified: Secondary | ICD-10-CM | POA: Diagnosis present

## 2012-11-28 DIAGNOSIS — G5690 Unspecified mononeuropathy of unspecified upper limb: Secondary | ICD-10-CM | POA: Diagnosis present

## 2012-11-28 DIAGNOSIS — R0609 Other forms of dyspnea: Secondary | ICD-10-CM | POA: Diagnosis not present

## 2012-11-28 DIAGNOSIS — G5790 Unspecified mononeuropathy of unspecified lower limb: Secondary | ICD-10-CM | POA: Diagnosis present

## 2012-11-28 DIAGNOSIS — K219 Gastro-esophageal reflux disease without esophagitis: Secondary | ICD-10-CM

## 2012-11-28 DIAGNOSIS — M11249 Other chondrocalcinosis, unspecified hand: Secondary | ICD-10-CM | POA: Diagnosis present

## 2012-11-28 DIAGNOSIS — K5900 Constipation, unspecified: Secondary | ICD-10-CM | POA: Diagnosis not present

## 2012-11-28 DIAGNOSIS — K810 Acute cholecystitis: Secondary | ICD-10-CM

## 2012-11-28 DIAGNOSIS — E785 Hyperlipidemia, unspecified: Secondary | ICD-10-CM | POA: Diagnosis present

## 2012-11-28 DIAGNOSIS — S6990XA Unspecified injury of unspecified wrist, hand and finger(s), initial encounter: Secondary | ICD-10-CM | POA: Diagnosis not present

## 2012-11-28 DIAGNOSIS — R634 Abnormal weight loss: Secondary | ICD-10-CM | POA: Diagnosis present

## 2012-11-28 DIAGNOSIS — K8010 Calculus of gallbladder with chronic cholecystitis without obstruction: Secondary | ICD-10-CM | POA: Diagnosis not present

## 2012-11-28 DIAGNOSIS — I120 Hypertensive chronic kidney disease with stage 5 chronic kidney disease or end stage renal disease: Secondary | ICD-10-CM | POA: Diagnosis present

## 2012-11-28 DIAGNOSIS — N186 End stage renal disease: Secondary | ICD-10-CM | POA: Diagnosis not present

## 2012-11-28 DIAGNOSIS — R0600 Dyspnea, unspecified: Secondary | ICD-10-CM

## 2012-11-28 DIAGNOSIS — I509 Heart failure, unspecified: Principal | ICD-10-CM

## 2012-11-28 DIAGNOSIS — J811 Chronic pulmonary edema: Secondary | ICD-10-CM

## 2012-11-28 DIAGNOSIS — R509 Fever, unspecified: Secondary | ICD-10-CM | POA: Diagnosis not present

## 2012-11-28 DIAGNOSIS — Z981 Arthrodesis status: Secondary | ICD-10-CM | POA: Diagnosis not present

## 2012-11-28 DIAGNOSIS — Z87891 Personal history of nicotine dependence: Secondary | ICD-10-CM

## 2012-11-28 DIAGNOSIS — R0602 Shortness of breath: Secondary | ICD-10-CM | POA: Diagnosis not present

## 2012-11-28 DIAGNOSIS — K824 Cholesterolosis of gallbladder: Secondary | ICD-10-CM | POA: Diagnosis not present

## 2012-11-28 DIAGNOSIS — M7989 Other specified soft tissue disorders: Secondary | ICD-10-CM | POA: Diagnosis not present

## 2012-11-28 DIAGNOSIS — IMO0002 Reserved for concepts with insufficient information to code with codable children: Secondary | ICD-10-CM

## 2012-11-28 DIAGNOSIS — M109 Gout, unspecified: Secondary | ICD-10-CM | POA: Diagnosis not present

## 2012-11-28 DIAGNOSIS — J069 Acute upper respiratory infection, unspecified: Secondary | ICD-10-CM | POA: Diagnosis not present

## 2012-11-28 DIAGNOSIS — F411 Generalized anxiety disorder: Secondary | ICD-10-CM | POA: Diagnosis present

## 2012-11-28 DIAGNOSIS — D696 Thrombocytopenia, unspecified: Secondary | ICD-10-CM

## 2012-11-28 DIAGNOSIS — N2581 Secondary hyperparathyroidism of renal origin: Secondary | ICD-10-CM | POA: Diagnosis not present

## 2012-11-28 DIAGNOSIS — Z992 Dependence on renal dialysis: Secondary | ICD-10-CM

## 2012-11-28 DIAGNOSIS — D631 Anemia in chronic kidney disease: Secondary | ICD-10-CM | POA: Diagnosis present

## 2012-11-28 DIAGNOSIS — T8610 Unspecified complication of kidney transplant: Secondary | ICD-10-CM | POA: Diagnosis not present

## 2012-11-28 DIAGNOSIS — Z79899 Other long term (current) drug therapy: Secondary | ICD-10-CM

## 2012-11-28 DIAGNOSIS — N189 Chronic kidney disease, unspecified: Secondary | ICD-10-CM | POA: Diagnosis present

## 2012-11-28 DIAGNOSIS — I10 Essential (primary) hypertension: Secondary | ICD-10-CM

## 2012-11-28 DIAGNOSIS — N039 Chronic nephritic syndrome with unspecified morphologic changes: Secondary | ICD-10-CM | POA: Diagnosis not present

## 2012-11-28 DIAGNOSIS — K8020 Calculus of gallbladder without cholecystitis without obstruction: Secondary | ICD-10-CM | POA: Diagnosis not present

## 2012-11-28 DIAGNOSIS — Y830 Surgical operation with transplant of whole organ as the cause of abnormal reaction of the patient, or of later complication, without mention of misadventure at the time of the procedure: Secondary | ICD-10-CM | POA: Diagnosis present

## 2012-11-28 DIAGNOSIS — K819 Cholecystitis, unspecified: Secondary | ICD-10-CM | POA: Diagnosis not present

## 2012-11-28 DIAGNOSIS — F3289 Other specified depressive episodes: Secondary | ICD-10-CM | POA: Diagnosis present

## 2012-11-28 DIAGNOSIS — R9389 Abnormal findings on diagnostic imaging of other specified body structures: Secondary | ICD-10-CM

## 2012-11-28 DIAGNOSIS — E8779 Other fluid overload: Secondary | ICD-10-CM | POA: Diagnosis not present

## 2012-11-28 DIAGNOSIS — M1120 Other chondrocalcinosis, unspecified site: Secondary | ICD-10-CM

## 2012-11-28 DIAGNOSIS — E8770 Fluid overload, unspecified: Secondary | ICD-10-CM

## 2012-11-28 HISTORY — DX: Heart failure, unspecified: I50.9

## 2012-11-28 LAB — CBC WITH DIFFERENTIAL/PLATELET
Basophils Absolute: 0.1 10*3/uL (ref 0.0–0.1)
Basophils Relative: 1 % (ref 0–1)
Eosinophils Absolute: 0.1 10*3/uL (ref 0.0–0.7)
Hemoglobin: 9.4 g/dL — ABNORMAL LOW (ref 12.0–15.0)
Lymphocytes Relative: 15 % (ref 12–46)
MCHC: 31.3 g/dL (ref 30.0–36.0)
Neutrophils Relative %: 71 % (ref 43–77)
Platelets: 185 10*3/uL (ref 150–400)
RDW: 19 % — ABNORMAL HIGH (ref 11.5–15.5)

## 2012-11-28 LAB — COMPREHENSIVE METABOLIC PANEL
ALT: 7 U/L (ref 0–35)
AST: 15 U/L (ref 0–37)
Albumin: 2.6 g/dL — ABNORMAL LOW (ref 3.5–5.2)
Calcium: 9.3 mg/dL (ref 8.4–10.5)
Sodium: 138 mEq/L (ref 135–145)
Total Protein: 7.2 g/dL (ref 6.0–8.3)

## 2012-11-28 LAB — CBC
MCHC: 31.3 g/dL (ref 30.0–36.0)
Platelets: 177 10*3/uL (ref 150–400)
RDW: 19 % — ABNORMAL HIGH (ref 11.5–15.5)
WBC: 9.6 10*3/uL (ref 4.0–10.5)

## 2012-11-28 LAB — URIC ACID: Uric Acid, Serum: 6.4 mg/dL (ref 2.4–7.0)

## 2012-11-28 LAB — CREATININE, SERUM
Creatinine, Ser: 7.88 mg/dL — ABNORMAL HIGH (ref 0.50–1.10)
GFR calc Af Amer: 6 mL/min — ABNORMAL LOW (ref >90)
GFR calc non Af Amer: 5 mL/min — ABNORMAL LOW (ref >90)

## 2012-11-28 LAB — PRO B NATRIURETIC PEPTIDE: Pro B Natriuretic peptide (BNP): 35557 pg/mL — ABNORMAL HIGH (ref 0–125)

## 2012-11-28 MED ORDER — CALCIUM CARBONATE 1250 MG/5ML PO SUSP
500.0000 mg | Freq: Four times a day (QID) | ORAL | Status: DC | PRN
  Filled 2012-11-28: qty 5

## 2012-11-28 MED ORDER — ONDANSETRON HCL 4 MG PO TABS: 4.0000 mg | ORAL_TABLET | Freq: Four times a day (QID) | ORAL | Status: DC | PRN

## 2012-11-28 MED ORDER — ONDANSETRON HCL 4 MG/2ML IJ SOLN
4.0000 mg | Freq: Once | INTRAMUSCULAR | Status: AC
  Administered 2012-11-28: 4 mg via INTRAVENOUS
  Filled 2012-11-28: qty 2

## 2012-11-28 MED ORDER — HYDROCODONE-ACETAMINOPHEN 5-325 MG PO TABS
1.0000 | ORAL_TABLET | Freq: Four times a day (QID) | ORAL | Status: DC | PRN
Start: 2012-11-28 — End: 2012-12-03
  Administered 2012-12-02 – 2012-12-03 (×3): 1 via ORAL
  Filled 2012-11-28 (×3): qty 1

## 2012-11-28 MED ORDER — SODIUM CHLORIDE 0.9 % IV SOLN: INTRAVENOUS | Status: DC

## 2012-11-28 MED ORDER — ACETAMINOPHEN 650 MG RE SUPP: 650.0000 mg | Freq: Four times a day (QID) | RECTAL | Status: DC | PRN

## 2012-11-28 MED ORDER — LABETALOL HCL 300 MG PO TABS
600.0000 mg | ORAL_TABLET | Freq: Two times a day (BID) | ORAL | Status: DC
  Administered 2012-11-28 – 2012-12-04 (×11): 600 mg via ORAL
  Filled 2012-11-28 (×13): qty 2

## 2012-11-28 MED ORDER — SODIUM CHLORIDE 0.9 % IJ SOLN
3.0000 mL | INTRAMUSCULAR | Status: DC | PRN
  Administered 2012-12-03: 3 mL via INTRAVENOUS

## 2012-11-28 MED ORDER — HYDROCODONE-ACETAMINOPHEN 5-500 MG PO TABS: 1.0000 | ORAL_TABLET | Freq: Four times a day (QID) | ORAL | Status: DC | PRN

## 2012-11-28 MED ORDER — FUROSEMIDE 10 MG/ML IJ SOLN
20.0000 mg | Freq: Once | INTRAMUSCULAR | Status: DC
  Filled 2012-11-28 (×2): qty 2

## 2012-11-28 MED ORDER — DARBEPOETIN ALFA-POLYSORBATE 100 MCG/0.5ML IJ SOLN
100.0000 ug | INTRAMUSCULAR | Status: DC
  Administered 2012-11-29: 100 ug via INTRAVENOUS
  Filled 2012-11-28: qty 0.5

## 2012-11-28 MED ORDER — GUAIFENESIN-DM 100-10 MG/5ML PO SYRP: 5.0000 mL | ORAL_SOLUTION | ORAL | Status: DC | PRN

## 2012-11-28 MED ORDER — DOXAZOSIN MESYLATE 8 MG PO TABS
8.0000 mg | ORAL_TABLET | Freq: Two times a day (BID) | ORAL | Status: DC
  Administered 2012-11-28 – 2012-12-04 (×11): 8 mg via ORAL
  Filled 2012-11-28 (×13): qty 1

## 2012-11-28 MED ORDER — ZOLPIDEM TARTRATE 5 MG PO TABS
5.0000 mg | ORAL_TABLET | Freq: Every evening | ORAL | Status: DC | PRN
  Administered 2012-11-29 – 2012-12-03 (×5): 5 mg via ORAL
  Filled 2012-11-28 (×5): qty 1

## 2012-11-28 MED ORDER — ALLOPURINOL 150 MG HALF TABLET
150.0000 mg | ORAL_TABLET | Freq: Every day | ORAL | Status: DC
  Administered 2012-11-28: 150 mg via ORAL
  Filled 2012-11-28 (×2): qty 1

## 2012-11-28 MED ORDER — RENA-VITE PO TABS
1.0000 | ORAL_TABLET | Freq: Every day | ORAL | Status: DC
  Administered 2012-11-28 – 2012-12-04 (×6): 1 via ORAL
  Filled 2012-11-28 (×8): qty 1

## 2012-11-28 MED ORDER — SODIUM CHLORIDE 0.9 % IV SOLN
Freq: Once | INTRAVENOUS | Status: AC
  Administered 2012-11-28: 10:00:00 via INTRAVENOUS

## 2012-11-28 MED ORDER — SODIUM CHLORIDE 0.9 % IV SOLN
250.0000 mL | INTRAVENOUS | Status: DC | PRN
  Administered 2012-11-30: 250 mL via INTRAVENOUS

## 2012-11-28 MED ORDER — SIMVASTATIN 10 MG PO TABS
10.0000 mg | ORAL_TABLET | Freq: Every day | ORAL | Status: DC
  Administered 2012-11-28 – 2012-12-03 (×6): 10 mg via ORAL
  Filled 2012-11-28 (×7): qty 1

## 2012-11-28 MED ORDER — PREDNISONE 5 MG PO TABS
15.0000 mg | ORAL_TABLET | Freq: Every day | ORAL | Status: DC
  Administered 2012-11-28 – 2012-11-29 (×2): 15 mg via ORAL
  Filled 2012-11-28 (×3): qty 1

## 2012-11-28 MED ORDER — ONDANSETRON HCL 4 MG/2ML IJ SOLN
4.0000 mg | Freq: Four times a day (QID) | INTRAMUSCULAR | Status: DC | PRN
  Administered 2012-11-30: 4 mg via INTRAVENOUS
  Filled 2012-11-28: qty 2

## 2012-11-28 MED ORDER — SORBITOL 70 % SOLN
30.0000 mL | Freq: Every day | Status: DC | PRN
  Filled 2012-11-28: qty 30

## 2012-11-28 MED ORDER — MORPHINE SULFATE 4 MG/ML IJ SOLN
4.0000 mg | Freq: Once | INTRAMUSCULAR | Status: AC
  Administered 2012-11-28: 4 mg via INTRAVENOUS
  Filled 2012-11-28: qty 1

## 2012-11-28 MED ORDER — ACETAMINOPHEN 325 MG PO TABS
650.0000 mg | ORAL_TABLET | Freq: Four times a day (QID) | ORAL | Status: DC | PRN
  Administered 2012-11-28 – 2012-12-02 (×3): 650 mg via ORAL
  Filled 2012-11-28 (×3): qty 2

## 2012-11-28 MED ORDER — ENOXAPARIN SODIUM 30 MG/0.3ML ~~LOC~~ SOLN
30.0000 mg | SUBCUTANEOUS | Status: DC
  Administered 2012-11-28 – 2012-12-03 (×6): 30 mg via SUBCUTANEOUS
  Filled 2012-11-28 (×7): qty 0.3

## 2012-11-28 MED ORDER — SODIUM CHLORIDE 0.9 % IJ SOLN
3.0000 mL | Freq: Two times a day (BID) | INTRAMUSCULAR | Status: DC
  Administered 2012-11-28 – 2012-12-04 (×10): 3 mL via INTRAVENOUS

## 2012-11-28 NOTE — ED Provider Notes (Signed)
Medical screening examination/treatment/procedure(s) were conducted as a shared visit with non-physician practitioner(s) and myself.  I personally evaluated the patient during the encounter  Hx ESRD with bodyaches, fever 101, nausea, vomiting, SOB since yesterday.   Recent admit for cholycystitis, no surgery 2/2 thrombocytopenia. Faint crackles, no distress. LUE graft with thrill. Hypoxic to 80s with ambulation.. Admit for volume overload.  Ezequiel Essex, MD 11/28/12 1755

## 2012-11-28 NOTE — ED Provider Notes (Signed)
History     CSN: [id number]  Arrival date & time 11/28/12  0904   First MD Initiated Contact with Patient 11/28/12 [phone]      Chief Complaint  Patient presents with  . Generalized Body Aches  . Emesis  . Diarrhea    (Consider location/radiation/quality/duration/timing/severity/associated sxs/prior treatment) Patient is a 61 y.o. female presenting with fever. The history is provided by the patient.  Fever Primary symptoms of the febrile illness include fever, cough, shortness of breath, nausea, vomiting and myalgias. Primary symptoms do not include abdominal pain, diarrhea or rash. The current episode started yesterday. This is a new problem.  Associated with: Generalized muscle aches, associated with fever starting yesterday. 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    Past Medical History  Diagnosis Date  . Anemia   . Anxiety   . Blood transfusion   . Depression   . GERD (gastroesophageal reflux disease)   . Hyperlipidemia   . Hypertension   . Chronic kidney disease     transplant 1996  . Neuromuscular disorder     neuropathy hand and legs  . Weight loss, unintentional   . Adenomatous polyp of colon 10/2010  . Diverticula, colon   . Heart murmur     Past Surgical History  Procedure Date  . Kidney transplant 1996  . Upper gastrointestinal endoscopy   . Back surgery   . Cervical fusion   . Av fistula placement     for dialysis  . Colonoscopy   . Abdominal hysterectomy     Family History  Problem Relation Age of Onset  . Colon cancer Brother   . Coronary artery disease Mother 2    History  Substance Use Topics  . Smoking status: Former Smoker    Types: Cigarettes    Quit date: 12/31/1991  . Smokeless tobacco: Never Used  . Alcohol Use: No    OB History    Grav Para Term Preterm Abortions TAB SAB Ect Mult Living                  Review of Systems  Constitutional: Positive for fever.  HENT: Positive for  rhinorrhea. Negative for congestion, trouble swallowing and sinus pressure.   Respiratory: Positive for cough and shortness of breath.   Cardiovascular: Negative.  Negative for chest pain.  Gastrointestinal: Positive for nausea and vomiting. Negative for abdominal pain and diarrhea.  Genitourinary:       The patient does not form urine.  Musculoskeletal: Positive for myalgias.  Skin: Negative for rash.  Neurological: Negative.   Psychiatric/Behavioral: Negative for confusion.    Allergies  Sulfa antibiotics  Home Medications   Current Outpatient Rx  Name  Route  Sig  Dispense  Refill  . ALLOPURINOL 150 MG HALF TABLET   Oral   Take 0.5 tablets (150 mg total) by mouth at bedtime.   15 tablet   0   . NEPHRO-VITE 0.8 MG PO TABS   Oral   Take 0.8 mg by mouth daily.          Marland Kitchen CALCIUM CARBONATE 1250 MG/5ML PO SUSP   Oral   Take 5 mLs (500 mg of elemental calcium total) by mouth every 6 (six) hours as needed for heartburn.   450 mL   0   . CIPROFLOXACIN HCL 500 MG PO TABS   Oral   Take 1 tablet (500 mg total) by mouth daily with supper.   7 tablet  0   . DOXAZOSIN MESYLATE 8 MG PO TABS   Oral   Take 8 mg by mouth 2 (two) times daily.           Marland Kitchen HYDROCODONE-ACETAMINOPHEN 5-500 MG PO TABS   Oral   Take 1 tablet by mouth every 6 (six) hours as needed. Back pain          . LABETALOL HCL 300 MG PO TABS   Oral   Take 2 tablets (600 mg total) by mouth 2 (two) times daily.   60 tablet   0   . METRONIDAZOLE 500 MG PO TABS   Oral   Take 1 tablet (500 mg total) by mouth every 8 (eight) hours.   21 tablet   0   . PRAVASTATIN SODIUM 20 MG PO TABS   Oral   Take 20 mg by mouth daily.           Marland Kitchen PREDNISONE 5 MG PO TABS   Oral   Take 5 mg by mouth daily.           Marland Kitchen PROMETHAZINE HCL 25 MG PO TABS   Oral   Take 25 mg by mouth every 6 (six) hours as needed. nausea          . ZOLPIDEM TARTRATE 5 MG PO TABS   Oral   Take 5 mg by mouth at bedtime as  needed. For insomnia           BP 170/89  Pulse 103  Temp 99.9 F (37.7 C) (Oral)  Resp 20  SpO2 95%  Physical Exam  Constitutional: She is oriented to person, place, and time. She appears well-developed and well-nourished.  HENT:  Head: Normocephalic.  Mouth/Throat: Mucous membranes are dry.  Neck: Normal range of motion. Neck supple.  Cardiovascular: Regular rhythm.  Tachycardia present.   No murmur heard. Pulmonary/Chest: Effort normal. She has rales.  Abdominal: Soft. Bowel sounds are normal. There is no tenderness. There is no rebound and no guarding.  Musculoskeletal: Normal range of motion. She exhibits no edema.       Joint swelling in hands bilaterally c/w history of gout. Dialysis graft left UE with positive thrill.  Neurological: She is alert and oriented to person, place, and time.  Skin: Skin is warm and dry. No rash noted.  Psychiatric: She has a normal mood and affect.    ED Course  Procedures (including critical care time) Results for orders placed during the hospital encounter of 11/28/12  CBC WITH DIFFERENTIAL      Component Value Range   WBC 10.6 (*) 4.0 - 10.5 K/uL   RBC 3.02 (*) 3.87 - 5.11 MIL/uL   Hemoglobin 9.4 (*) 12.0 - 15.0 g/dL   HCT 30.0 (*) 36.0 - 46.0 %   MCV 99.3  78.0 - 100.0 fL   MCH 31.1  26.0 - 34.0 pg   MCHC 31.3  30.0 - 36.0 g/dL   RDW 19.0 (*) 11.5 - 15.5 %   Platelets 185  150 - 400 K/uL   Neutrophils Relative 71  43 - 77 %   Lymphocytes Relative 15  12 - 46 %   Monocytes Relative 12  3 - 12 %   Eosinophils Relative 1  0 - 5 %   Basophils Relative 1  0 - 1 %   Neutro Abs 7.5  1.7 - 7.7 K/uL   Lymphs Abs 1.6  0.7 - 4.0 K/uL   Monocytes Absolute 1.3 (*) 0.1 -  1.0 K/uL   Eosinophils Absolute 0.1  0.0 - 0.7 K/uL   Basophils Absolute 0.1  0.0 - 0.1 K/uL   RBC Morphology ELLIPTOCYTES     WBC Morphology ATYPICAL LYMPHOCYTES    COMPREHENSIVE METABOLIC PANEL      Component Value Range   Sodium 138  135 - 145 mEq/L   Potassium  4.5  3.5 - 5.1 mEq/L   Chloride 96  96 - 112 mEq/L   CO2 23  19 - 32 mEq/L   Glucose, Bld 105 (*) 70 - 99 mg/dL   BUN 35 (*) 6 - 23 mg/dL   Creatinine, Ser 7.45 (*) 0.50 - 1.10 mg/dL   Calcium 9.3  8.4 - 10.5 mg/dL   Total Protein 7.2  6.0 - 8.3 g/dL   Albumin 2.6 (*) 3.5 - 5.2 g/dL   AST 15  0 - 37 U/L   ALT 7  0 - 35 U/L   Alkaline Phosphatase 81  39 - 117 U/L   Total Bilirubin 0.4  0.3 - 1.2 mg/dL   GFR calc non Af Amer 5 (*) >90 mL/min   GFR calc Af Amer 6 (*) >90 mL/min  PRO B NATRIURETIC PEPTIDE      Component Value Range   Pro B Natriuretic peptide (BNP) 35557.0 (*) 0 - 125 pg/mL  TROPONIN I      Component Value Range   Troponin I <0.30  <0.30 ng/mL   Dg Chest 2 View  11/28/2012  *RADIOLOGY REPORT*  Clinical Data: Chest pain and weakness.  CHEST - 2 VIEW  Comparison: 11/06/2012.  Findings: The heart is enlarged but stable.  There is tortuosity, ectasia and calcification of the thoracic aorta.  The pulmonary hila are slightly prominent but unchanged.  There are increased interstitial markings, Kerley B lines and peribronchial thickening consistent with interstitial pulmonary edema.  Small bilateral pleural effusions are noted.  No focal infiltrate.  IMPRESSION:  Mild CHF.   Original Report Authenticated By: Marijo Sanes, M.D.    US Abdomen Complete  11/07/2012  *RADIOLOGY REPORT*  Clinical Data:  Abdominal pain and fever.  ABDOMINAL ULTRASOUND COMPLETE  Comparison:  Renal ultrasound performed 03/04/2008, and MRI of the pelvis performed 08/19/2012  Findings:  Gallbladder: The gallbladder wall is thickened and edematous, measuring up to 1.0 cm in thickness.  A 5 mm polyp is noted near the base of the gallbladder.  No significant pericholecystic fluid is seen; a positive ultrasonographic Murphy's sign is elicited.  Common Bile Duct:  0.6 cm in diameter; within normal limits in caliber.  Liver:  Normal parenchymal echogenicity and echotexture; no focal lesions identified.  Limited  Doppler evaluation demonstrates normal blood flow within the liver.  IVC:  Unremarkable in appearance.  Pancreas:  Although the pancreas is difficult to visualize in its entirety due to overlying bowel gas, no focal pancreatic abnormality is identified.  Spleen:  12.9 cm in length; measures 348 mL in volume, within normal limits, with scattered calcified granulomata.  Right kidney:  5.8 cm in length; diffusely atrophic, with scattered small cysts.  Left kidney:  6.7 cm in length; diffusely atrophic, with scattered small cysts.  Abdominal Aorta:  Normal in caliber; no aneurysm identified. Plaque is noted along the course of the abdominal aorta.  Trace ascites is noted.  A small right pleural effusion is seen.  The patient's transplant kidney, noted within the pelvis, measures 10.1 cm in length, and demonstrates very mild hydronephrosis.  IMPRESSION:  1.  Diffusely thickened and  edematous gallbladder wall, measuring up to 1.0 cm in thickness, with a positive ultrasonographic Murphy's sign; this raises concern for cholecystitis.  No definite stones identified on ultrasound; this could conceivably reflect acalculous cholecystitis.  Suggest clinical correlation. 2.  Apparent 5 mm polyp noted at the base of the gallbladder. 3.  Diffusely atrophic native kidneys, reflecting the patient's chronic renal failure. 4.  Very mild hydronephrosis noted with respect to the patient's transplant kidney in the pelvis.   Original Report Authenticated By: Santa Lighter, M.D.    Dg Chest Port 1 View  11/06/2012  *RADIOLOGY REPORT*  Clinical Data: Fever, shortness of breath and chest pain.  PORTABLE CHEST - 1 VIEW  Comparison: Chest x-ray 11/15/2009.  Findings: Interval development of the interstitial prominence and ill-defined air space disease in the right base.  This does not obscure either the right hemidiaphragm or the right heart border. Fine pattern of peripheral micronodularity in the upper lobes of the lungs bilaterally are  unchanged compared to prior examinations. No definite pleural effusions.  Pulmonary vasculature is normal. Heart size is mildly enlarged.  Mediastinal contours are unremarkable.  Atherosclerosis of the thoracic aorta.  Old healed fracture of the posterolateral aspect of the left seventh rib is again noted.  Orthopedic fixation hardware in the mid cervical spine incompletely visualized.  IMPRESSION: 1.  New ill-defined interstitial and airspace opacity in the right base, favored to be in the right lower lobe, concerning for bronchopneumonia. 2.  Radiographic appearance of the chest is otherwise essentially unchanged compared to prior examinations, as above.   Original Report Authenticated By: Vinnie Langton, M.D.    Labs Reviewed - No data to display No results found.   No diagnosis found.  1. chf 2. ESRD-HD   MDM  Patient desaturates to upper 80's while walking and has subjective SOB as well. No infection to account for patient's given history of fever, but feel she will need admission for CHF. Discussed with Dr. Melvia Heaps --> admite to [facility]. Discussed with Dr. Inis Sizer.       Leotis Shames, PA-C 11/28/12 1526

## 2012-11-28 NOTE — Consult Note (Signed)
[facility] Renal Consultation Note  Indication for Consultation:  Management of ESRD/hemodialysis; anemia, hypertension/volume and secondary hyperparathyroidism  HPI: [name] is a 61 y.o. female admitted  with fevers at home , sob , pain all over her body since after hd past Saturday. In the ER temp 100.2 and CXR  Showing  Mild chf. She left at her edw 11/26/12 hd with no reported problems at hd and was afebrile at hd using her AVF. Noted recent admit 11/24 to 11/27 13 with Acute Cholecystitis  And Thrombocytopenia 35,000 at 11/09/12 with abdominal symptoms resolved with conservative care , no surgery sec.to Thrombocytopenia. She was dc on 7 days of Cipro and Flagyl . Reports some abdominal discomfort  now  and sob in er but in no distress.Noting bilateral hand swelling and pain  On Allopurinal" as when her Gout acts up".She has history of failed renal transplant with hd restart 10/15/2011 after 1997 Renal transplant( [facility]) on chronic steroid therapy.      Past Medical History  Diagnosis Date  . Anemia   . Anxiety   . Blood transfusion   . Depression   . GERD (gastroesophageal reflux disease)   . Hyperlipidemia   . Hypertension   . Chronic kidney disease     transplant 1996  . Neuromuscular disorder     neuropathy hand and legs  . Weight loss, unintentional   . Adenomatous polyp of colon 10/2010  . Diverticula, colon   . Heart murmur     Past Surgical History  Procedure Date  . Kidney transplant 1996  . Upper gastrointestinal endoscopy   . Back surgery   . Cervical fusion   . Av fistula placement     for dialysis  . Colonoscopy   . Abdominal hysterectomy       Family History  Problem Relation Age of Onset  . Colon cancer Brother   . Coronary artery disease Mother 11   Social = Recent Husband death this year, living with sister and   reports that she quit smoking about 20 years ago. Her smoking use included Cigarettes. She has  never used smokeless tobacco. She reports that she does not drink alcohol or use illicit drugs.   Allergies  Allergen Reactions  . Sulfa Antibiotics     Both parents allergic-so will not take    Prior to Admission medications   Medication Sig Start Date End Date Taking? Authorizing Provider  allopurinol (ZYLOPRIM) 150 mg TABS Take 0.5 tablets (150 mg total) by mouth at bedtime. 11/09/12  Yes Velvet Bathe, MD  b complex-vitamin c-folic acid (NEPHRO-VITE) 0.8 MG TABS Take 0.8 mg by mouth daily.    Yes Historical Provider, MD  calcium carbonate, dosed in mg elemental calcium, 1250 MG/5ML Take 5 mLs (500 mg of elemental calcium total) by mouth every 6 (six) hours as needed for heartburn. 11/09/12  Yes Velvet Bathe, MD  ciprofloxacin (CIPRO) 500 MG tablet Take 1 tablet (500 mg total) by mouth daily with supper. 11/10/12  Yes Velvet Bathe, MD  doxazosin (CARDURA) 8 MG tablet Take 8 mg by mouth 2 (two) times daily.     Yes Historical Provider, MD  HYDROcodone-acetaminophen (VICODIN) 5-500 MG per tablet Take 1 tablet by mouth every 6 (six) hours as needed. Back pain    Yes Historical Provider, MD  labetalol (NORMODYNE) 300 MG tablet Take 2 tablets (600 mg total) by mouth 2 (two) times daily. 11/09/12  Yes Velvet Bathe, MD  metroNIDAZOLE (FLAGYL) 500 MG tablet Take 1 tablet (500 mg total) by mouth every 8 (eight) hours. 11/10/12  Yes Velvet Bathe, MD  pravastatin (PRAVACHOL) 20 MG tablet Take 20 mg by mouth daily.     Yes Historical Provider, MD  predniSONE (DELTASONE) 5 MG tablet Take 5 mg by mouth daily.     Yes Historical Provider, MD  promethazine (PHENERGAN) 25 MG tablet Take 25 mg by mouth every 6 (six) hours as needed. nausea    Yes Historical Provider, MD  zolpidem (AMBIEN) 5 MG tablet Take 5 mg by mouth at bedtime as needed. For insomnia   Yes Historical Provider, MD    PRN:  Results for orders placed during the hospital encounter of 11/28/12 (from the past 48 hour(s))  CBC WITH  DIFFERENTIAL     Status: Abnormal   Collection Time   11/28/12 10:00 AM      Component Value Range Comment   WBC 10.6 (*) 4.0 - 10.5 K/uL    RBC 3.02 (*) 3.87 - 5.11 MIL/uL    Hemoglobin 9.4 (*) 12.0 - 15.0 g/dL    HCT 30.0 (*) 36.0 - 46.0 %    MCV 99.3  78.0 - 100.0 fL    MCH 31.1  26.0 - 34.0 pg    MCHC 31.3  30.0 - 36.0 g/dL    RDW 19.0 (*) 11.5 - 15.5 %    Platelets 185  150 - 400 K/uL    Neutrophils Relative 71  43 - 77 %    Lymphocytes Relative 15  12 - 46 %    Monocytes Relative 12  3 - 12 %    Eosinophils Relative 1  0 - 5 %    Basophils Relative 1  0 - 1 %    Neutro Abs 7.5  1.7 - 7.7 K/uL    Lymphs Abs 1.6  0.7 - 4.0 K/uL    Monocytes Absolute 1.3 (*) 0.1 - 1.0 K/uL    Eosinophils Absolute 0.1  0.0 - 0.7 K/uL    Basophils Absolute 0.1  0.0 - 0.1 K/uL    RBC Morphology ELLIPTOCYTES   POLYCHROMASIA PRESENT   WBC Morphology ATYPICAL LYMPHOCYTES     COMPREHENSIVE METABOLIC PANEL     Status: Abnormal   Collection Time   11/28/12 10:00 AM      Component Value Range Comment   Sodium 138  135 - 145 mEq/L    Potassium 4.5  3.5 - 5.1 mEq/L    Chloride 96  96 - 112 mEq/L    CO2 23  19 - 32 mEq/L    Glucose, Bld 105 (*) 70 - 99 mg/dL    BUN 35 (*) 6 - 23 mg/dL    Creatinine, Ser 7.45 (*) 0.50 - 1.10 mg/dL    Calcium 9.3  8.4 - 10.5 mg/dL    Total Protein 7.2  6.0 - 8.3 g/dL    Albumin 2.6 (*) 3.5 - 5.2 g/dL    AST 15  0 - 37 U/L    ALT 7  0 - 35 U/L    Alkaline Phosphatase 81  39 - 117 U/L    Total Bilirubin 0.4  0.3 - 1.2 mg/dL    GFR calc non Af Amer 5 (*) >90 mL/min    GFR calc Af Amer 6 (*) >90 mL/min   PRO B NATRIURETIC PEPTIDE     Status: Abnormal   Collection Time   11/28/12 12:37 PM      Component  Value Range Comment   Pro B Natriuretic peptide (BNP) 35557.0 (*) 0 - 125 pg/mL   TROPONIN I     Status: Normal   Collection Time   11/28/12 12:37 PM      Component Value Range Comment   Troponin I <0.30  <0.30 ng/mL      ROS: As above and myalgias , Nausea  and vomiting,   Physical Exam: Filed Vitals:   11/28/12 1230  BP: 145/84  Pulse: 82  Temp:   Resp: 18     General: Alert, nad, thin BF chronically ill and Apprpriate HEENT: [place], MMM Eyes: eomi,nonicterus Neck: Supple Heart: RRR, no rub or gallop, soft 1/6 sem lsb Lungs: Faint rales  Abdomen: BS +=, ?trace ascites , tender right upper and lower quads Extremities: bilat. 1= bipedal edma Skin: no rash  Neuro: OX 3 Dialysis Access: pos. 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 EDW 61.5 kg HD Bath 2.0 k,2.0 ca  Time 4 Heparin 6.2 ml. Access L U AVF BFR 300 DFR 800    Zemplar 7 mcg IV/HD Epogen 10000   Units IV/HD  Venofer  50 mg weekly  Other   Assessment/Plan 1. Volume overload w pulm edema/SOB= Hd  uf 2. ESRD -  TTS ( [facility]) 4.5 K+ 3. Hypertension/volume  - will need lower edw, 145/84 bp 4. Fever= Wu Per admit 5. Recent Cholecycstists admit= some abdominal discomfort  Wu per admit  6. Anemia  - hgb= 9.4   epo and weekly iron on hd 7. Metabolic bone disease -  Zemplar on hd and binder  8. Gout-Allopurinol 9. Thrombocytopenia recently resolved =185, 000 in ER  Ernest Haber, PA-C [place] [phone] 11/28/2012, 3:33 PM   Patient seen and examined and agree with assessment and plan as above.  Kelly Splinter  MD [employer] [phone] pgr    [phone] cell 11/28/2012, 6:22 PM

## 2012-11-28 NOTE — ED Notes (Signed)
Spoke with phlebotomist tech, will draw lab for troponin I and BNP

## 2012-11-28 NOTE — ED Notes (Signed)
Admitting MD at bedside.

## 2012-11-28 NOTE — ED Notes (Signed)
Pt ambulate with steady balance, non-sliper sucks on and minimal assistant, C/O SOB and O2 dropped from 89%

## 2012-11-28 NOTE — H&P (Signed)
[facility] History and Physical  [name] [id number] DOB: July 20, 1951 DOA: 11/28/2012  Referring physician:  PCP: Estanislado Emms, MD  Specialists:   Chief Complaint: Shortness of breath and generalized body aches  HPI: [name] is a 61 y.o. female with multiple medical problems including end-stage renal disease(TTS dialysis) status post failed kidney transplant in 1996, hypertension, gout, recently hospitalized in November with acute cholecystitis treated conservatively secondary to thrombocytopenia presents with above complaints. She states that she was dialyzed on Tuesday and shortly thereafter she began feeling weak and developed of generalized body aches (soreness)-as well just and myalgias. She also states that the that she's had of fevers up to 101 and a cough productive of whitish phlegm. She denies any sick contacts. She reports she is also had some swelling especially in her right hand across her knuckles with pain. She admits to nausea but no vomiting and denies abdominal pain. She also reports that since Saturday the shortness of breath has worsened and so she came to the ED today. She denies chest pain. She was seen in the ED and a chest x-ray showed pulmonary edema, no focal infiltrates. Renal was consulted and she is admitted to the hospitalist service for further evaluation and management.  Review of Systems: The patient denies anorexia, weight loss,, vision loss, decreased hearing, hoarseness, chest pain, syncope, dyspnea on exertion, peripheral edema, balance deficits, hemoptysis, abdominal pain, melena, hematochezia, severe indigestion/heartburn, hematuria, incontinence, muscle weakness, suspicious skin lesions, transient blindness, difficulty walking, depression, unusual weight change.    Past Medical History  Diagnosis Date  . Anemia   . Anxiety   . Blood transfusion   . Depression   . GERD (gastroesophageal reflux disease)   . Hyperlipidemia    . Hypertension   . Chronic kidney disease     transplant 1996  . Neuromuscular disorder     neuropathy hand and legs  . Weight loss, unintentional   . Adenomatous polyp of colon 10/2010  . Diverticula, colon   . Heart murmur    Past Surgical History  Procedure Date  . Kidney transplant 1996  . Upper gastrointestinal endoscopy   . Back surgery   . Cervical fusion   . Av fistula placement     for dialysis  . Colonoscopy   . Abdominal hysterectomy    Social History:  reports that she quit smoking about 20 years ago. Her smoking use included Cigarettes. She has never used smokeless tobacco. She reports that she does not drink alcohol or use illicit drugs.  where does patient live--home  Allergies  Allergen Reactions  . Sulfa Antibiotics     Both parents allergic-so will not take    Family History  Problem Relation Age of Onset  . Colon cancer Brother   . Coronary artery disease Mother 73    Prior to Admission medications   Medication Sig Start Date End Date Taking? Authorizing Provider  allopurinol (ZYLOPRIM) 150 mg TABS Take 0.5 tablets (150 mg total) by mouth at bedtime. 11/09/12  Yes Velvet Bathe, MD  b complex-vitamin c-folic acid (NEPHRO-VITE) 0.8 MG TABS Take 0.8 mg by mouth daily.    Yes Historical Provider, MD  calcium carbonate, dosed in mg elemental calcium, 1250 MG/5ML Take 5 mLs (500 mg of elemental calcium total) by mouth every 6 (six) hours as needed for heartburn. 11/09/12  Yes Velvet Bathe, MD  ciprofloxacin (CIPRO) 500 MG tablet Take 1 tablet (500 mg total) by mouth daily with supper. 11/10/12  Yes Velvet Bathe, MD  doxazosin (CARDURA) 8 MG tablet Take 8 mg by mouth 2 (two) times daily.     Yes Historical Provider, MD  HYDROcodone-acetaminophen (VICODIN) 5-500 MG per tablet Take 1 tablet by mouth every 6 (six) hours as needed. Back pain    Yes Historical Provider, MD  labetalol (NORMODYNE) 300 MG tablet Take 2 tablets (600 mg total) by mouth 2 (two) times  daily. 11/09/12  Yes Velvet Bathe, MD  metroNIDAZOLE (FLAGYL) 500 MG tablet Take 1 tablet (500 mg total) by mouth every 8 (eight) hours. 11/10/12  Yes Velvet Bathe, MD  pravastatin (PRAVACHOL) 20 MG tablet Take 20 mg by mouth daily.     Yes Historical Provider, MD  predniSONE (DELTASONE) 5 MG tablet Take 5 mg by mouth daily.     Yes Historical Provider, MD  promethazine (PHENERGAN) 25 MG tablet Take 25 mg by mouth every 6 (six) hours as needed. nausea    Yes Historical Provider, MD  zolpidem (AMBIEN) 5 MG tablet Take 5 mg by mouth at bedtime as needed. For insomnia   Yes Historical Provider, MD   Physical Exam: Filed Vitals:   11/28/12 1137 11/28/12 1200 11/28/12 1230 11/28/12 1530  BP:  159/92 145/84 151/83  Pulse:  87 82 86  Temp: 100.2 F (37.9 C)   99.8 F (37.7 C)  TempSrc: Oral     Resp:  18 18   SpO2:  95% 95% 97%   Constitutional: Vital signs reviewed.  Patient is well-developed and well-nourished  in no acute distress and cooperative with exam. Alert and oriented x3.  Head: Normocephalic and atraumatic Mouth: no erythema or exudates, MMM Eyes: PERRL, EOMI, conjunctivae normal, No scleral icterus.  Neck: Supple, Trachea midline normal ROM, No JVD, mass, thyromegaly, or carotid bruit present.  Cardiovascular: RRR, S1 normal, S2 normal, no MRG, pulses symmetric and intact bilaterally Pulmonary/Chest: Basilar crackles, no wheezes or rhonchi Abdominal: Soft. Obese, right upper quadrant tenderness present, bowel sounds are normal, no masses, organomegaly, or guarding present.  Extremities: Upper extremities-edematous tender over the metatarsal heads on the right hand, also tender over her left hand but less joint swelling. Lower extremities-no cyanosis no edema  Neurological: A&O x3, Strength is normal and symmetric bilaterally, cranial nerve II-XII are grossly intact, no focal motor deficit, sensory intact to light touch bilaterally.  Skin: Warm, dry and intact.Marland Kitchen  Psychiatric:  Normal mood and affect. speech and behavior is normal. Judgment and thought content normal. Cognition and memory are normal.     Labs on Admission:  Basic Metabolic Panel:  Lab [id number] 1000  NA 138  K 4.5  CL 96  CO2 23  GLUCOSE 105*  BUN 35*  CREATININE 7.45*  CALCIUM 9.3  MG --  PHOS --   Liver Function Tests:  Lab 11/28/12 1000  AST 15  ALT 7  ALKPHOS 81  BILITOT 0.4  PROT 7.2  ALBUMIN 2.6*   No results found for this basename: LIPASE:5,AMYLASE:5 in the last 168 hours No results found for this basename: AMMONIA:5 in the last 168 hours CBC:  Lab 11/28/12 1000  WBC 10.6*  NEUTROABS 7.5  HGB 9.4*  HCT 30.0*  MCV 99.3  PLT 185   Cardiac Enzymes:  Lab 11/28/12 1237  CKTOTAL --  CKMB --  CKMBINDEX --  TROPONINI <0.30    BNP (last 3 results)  Basename 11/28/12 1237  PROBNP 35557.0*   CBG: No results found for this basename: GLUCAP:5 in the last 168 hours  Radiological Exams on Admission: Dg Chest 2 View  11/28/2012  *RADIOLOGY REPORT*  Clinical Data: Chest pain and weakness.  CHEST - 2 VIEW  Comparison: 11/06/2012.  Findings: The heart is enlarged but stable.  There is tortuosity, ectasia and calcification of the thoracic aorta.  The pulmonary hila are slightly prominent but unchanged.  There are increased interstitial markings, Kerley B lines and peribronchial thickening consistent with interstitial pulmonary edema.  Small bilateral pleural effusions are noted.  No focal infiltrate.  IMPRESSION:  Mild CHF.   Original Report Authenticated By: Marijo Sanes, M.D.       Assessment/Plan Principal Problem:  *Pulmonary edema -As discussed above, likely from volume overload due to her end-stage renal disease. -Renal consulted for possible dialysis today, follow. -Troponin in the ED negative, she is chest pain-free. Active Problems:  ESRD (end stage renal disease) -As above, TTS dialysis-renal to see.  HTN (hypertension) -Continue outpatient  medications  Anemia -Secondary to end-stage renal disease, stable. No gross bleeding.  Thrombocytopenia -resolved, was likely secondary to Zosyn  URI (upper respiratory infection)-with fevers and myalgias -Influenza panel, follow. -Supportive care and follow. Recent acute cholecystitis -s/p antibiotics, managed conservatively. She does not have any abdominal pain at this time and is not vomiting. Will continue to monitor as her thrombocytopenia has resolved and if becomes symptomatic would recommend reconsulting surgery in house.Marland Kitchen History of gout with right hand joint pain and swelling  -Continue allopurinol, will increase her chronic prednisone and follow.  -Obtain x-ray of her right hand further evaluate, and a uric acid level. .   Code Status: full Family Communication: mother at bedside Disposition Plan: Admit to telemetry bed  Time spent:>37min  Sheila Oats [facility] Pager [phone]  If 7PM-7AM, please contact night-coverage [email or web address] Password [facility] 11/28/2012, 4:57 PM

## 2012-11-28 NOTE — Progress Notes (Signed)
1800 report taken from [place] <rn . Pt fully awake alert and oriented x 3 . Both hand feel warm ,tender to touch , limited to hand grips Av shunt to L forearm Positive to thrill and bruitt . No edema .  L hand xray as ordered . Diet served and well .  !900 resting comfortably .with family member at bedside  199 report given to [facility]

## 2012-11-28 NOTE — ED Notes (Signed)
Reports having generalized body pains, fever, diarrhea, and vomiting. Last dialysis was Saturday.

## 2012-11-29 ENCOUNTER — Telehealth: Payer: Self-pay | Admitting: *Deleted

## 2012-11-29 ENCOUNTER — Encounter (HOSPITAL_COMMUNITY): Payer: Self-pay | Admitting: General Practice

## 2012-11-29 ENCOUNTER — Observation Stay (HOSPITAL_COMMUNITY): Payer: Medicare Other

## 2012-11-29 DIAGNOSIS — K819 Cholecystitis, unspecified: Secondary | ICD-10-CM

## 2012-11-29 DIAGNOSIS — J811 Chronic pulmonary edema: Secondary | ICD-10-CM | POA: Diagnosis not present

## 2012-11-29 LAB — CBC
MCH: 30.8 pg (ref 26.0–34.0)
MCHC: 31.2 g/dL (ref 30.0–36.0)
Platelets: 196 10*3/uL (ref 150–400)
RDW: 18.9 % — ABNORMAL HIGH (ref 11.5–15.5)

## 2012-11-29 LAB — TYPE AND SCREEN
ABO/RH(D): O POS
Antibody Screen: NEGATIVE

## 2012-11-29 LAB — BASIC METABOLIC PANEL
BUN: 13 mg/dL (ref 6–23)
CO2: 29 mEq/L (ref 19–32)
Chloride: 101 mEq/L (ref 96–112)
Creatinine, Ser: 3.35 mg/dL — ABNORMAL HIGH (ref 0.50–1.10)
Potassium: 3.8 mEq/L (ref 3.5–5.1)
Sodium: 142 mEq/L (ref 135–145)

## 2012-11-29 LAB — PROTIME-INR: Prothrombin Time: 14.8 seconds (ref 11.6–15.2)

## 2012-11-29 LAB — INFLUENZA PANEL BY PCR (TYPE A & B): H1N1 flu by pcr: NOT DETECTED

## 2012-11-29 MED ORDER — PREDNISONE 20 MG PO TABS
40.0000 mg | ORAL_TABLET | Freq: Every day | ORAL | Status: AC
  Administered 2012-11-30 – 2012-12-03 (×5): 40 mg via ORAL
  Filled 2012-11-29 (×5): qty 2

## 2012-11-29 NOTE — Progress Notes (Signed)
Subjective:   No current complaints, breathing much better since dialysis last night, improving appetite.  Objective: Vital signs in last 24 hours: Temp:  [97.7 F (36.5 C)-102.6 F (39.2 C)] 97.7 F (36.5 C) (12/17 0459) Pulse Rate:  [68-98] 73  (12/17 0459) Resp:  [11-23] 18  (12/17 0459) BP: (82-161)/(50-92) 117/68 mmHg (12/17 0459) SpO2:  [95 %-100 %] 100 % (12/17 0459) Weight:  [60.464 kg (133 lb 4.8 oz)-65.3 kg (143 lb 15.4 oz)] 60.464 kg (133 lb 4.8 oz) (12/17 0421) Weight change:   Intake/Output from previous day: 12/16 0701 - 12/17 0700 In: 3 [I.V.:3] Out: 3288    EXAM: General appearance:  Alert, comfortable, in no apparent distress Resp:  CTA without rales, rhonchi, or wheezes Cardio:  RRR with Gr I/VI systolic murmur, no rub GI: + BS, soft and nontender Extremities:  No edema Access:  AVF @ LUA with + bruit  Lab Results:  Basename 11/29/12 0505 11/28/12 1844  WBC 10.7* 9.6  HGB 8.9* 8.6*  HCT 28.5* 27.5*  PLT 196 177   BMET:  Basename 11/29/12 0505 11/28/12 1844 11/28/12 1000  NA 142 -- 138  K 3.8 -- 4.5  CL 101 -- 96  CO2 29 -- 23  GLUCOSE 187* -- 105*  BUN 13 -- 35*  CREATININE 3.35* 7.88* --  CALCIUM 8.6 -- 9.3  ALBUMIN -- -- 2.6*   No results found for this basename: PTH:2 in the last 72 hours Iron Studies: No results found for this basename: IRON,TIBC,TRANSFERRIN,FERRITIN in the last 72 hours  Dialysis Orders: [facility] .  EDW 61.5 kg HD Bath 2.0 k,2.0 ca Time 4 Heparin 6.2 ml. Access L U AVF BFR 300 DFR 800  Zemplar 7 mcg IV/HD Epogen 10000 Units IV/HD Venofer 50 mg weekly  Assessment/Plan: 1. Dyspnea - secondary to pulmonary edema per chest x-ray 12/16,resolved s/p HD. Still dyspneic, will get repeat CXR to see if further HD is needed 2. ESRD - HD on TTS @ [place]; K 3.8 s/p HD last night.   3. HTN/Volume - BP 117/68 on Labetalol 600 mg bid and Doxazosin 8 mg bid; wt 60.4 kg today s/p net UF 3.3 L (EDW 61.5 kg).  Lower EDW at  discharge. 4. Fever - WBCs up to 10.7, blood cultures 12/16 pending.  Temp 103 deg last night. She was here 11/24-11/27 for acute cholecystitis (high fever, RUQ pain, thick GB wall > 1cm, no stones) but did not have surgery reportedly due to low platelets 30K. Platelets are normal now, and pt/family are concerned about fevers being related to gallbladder. Will d/w primary. 5. Anemia - Hgb 8.9, s/p Araesp 100 mcg. 6. Metabolic bone disease - Ca 8.6 (9.7 corrected); outpatient Zemplar 7 mcg, no current binders. 7. Gout - on Allopurinol. 8. Hx Thrombocytopenia - resolved.  9. 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   LOS: 1 day   LYLES,CHARLES 11/29/2012,9:34 AM  Patient seen and examined and agree with assessment and plan as above with additions as indicated.  Kelly Splinter  MD [facility] [phone] pgr    [phone] cell 11/29/2012, 12:20 PM

## 2012-11-29 NOTE — Progress Notes (Signed)
Patient interviewed and examined, agree with PA note above. Patient clearly had an episode of acute cholecystitis last month and surgery was deferred due to thrombocytopenia which has resolved.  Her current clinical picture is quite different without abdominal pain but she has had fever but I'm not sure is completely explained. Her abdomen is currently nontender.  I expect that long term she would be better served with cholecystectomy. We could possibly arrange this during this hospitalization. We'll discuss further with her medical team. Edward Jolly MD, FACS  11/29/2012 7:12 PM

## 2012-11-29 NOTE — Progress Notes (Signed)
Patient ID: [name], female   DOB: 06/13/1951, 61 y.o.   MRN: [id number]   LOS: 1 day   Subjective: Asked to see [name] by her primary secondary to fevers and her h/o acute cholecystitis last month. She states she began feeling ill on Friday with generalized weakness, nausea, and vomiting. The nausea persisted until Sunday (though she did not vomit again) then abated. She received HD over the weekend and the renal MD's told her to come to the hospital if she continued to feel unwell. She was admitted yesterday and has run intermittent fevers up to 102.6. She was dialyzed yesterday and is feeling much better today though not back to her normal self. She admits to some left flank pain but denies abdominal pain and specifically says she has not had the kind of pain she had when she was here last month. She received a regular tray for lunch and finished it.  Objective: Vital signs in last 24 hours: Temp:  [97.7 F (36.5 C)-102.6 F (39.2 C)] 97.7 F (36.5 C) (12/17 0459) Pulse Rate:  [68-98] 73  (12/17 0459) Resp:  [11-23] 18  (12/17 0459) BP: (82-161)/(50-91) 131/75 mmHg (12/17 1008) SpO2:  [97 %-100 %] 100 % (12/17 0459) Weight:  [133 lb 4.8 oz (60.464 kg)-143 lb 15.4 oz (65.3 kg)] 133 lb 4.8 oz (60.464 kg) (12/17 0421) Last BM Date: 11/25/12  Lab Results:  CBC  Basename 11/29/12 0505 11/28/12 1844  WBC 10.7* 9.6  HGB 8.9* 8.6*  HCT 28.5* 27.5*  PLT 196 177   BMET  Basename 11/29/12 0505 11/28/12 1844 11/28/12 1000  NA 142 -- 138  K 3.8 -- 4.5  CL 101 -- 96  CO2 29 -- 23  GLUCOSE 187* -- 105*  BUN 13 -- 35*  CREATININE 3.35* 7.88* --  CALCIUM 8.6 -- 9.3    General appearance: alert and no distress Resp: clear to auscultation bilaterally Cardio: regular rate and rhythm GI: Soft, +BS. NT. Negative Murphy's sign. Pulses: 2+ and symmetric   Assessment/Plan: FUO -- The etiology of this illness seems to be other than her GB. A repeat US could be obtained but I  think this is unnecessary. H/o acalculus cholecystitis -- Family desires cholecystectomy during this admission. Her previous contraindication (thrombocytopenia) is no longer an issue. Will discuss with surgeon.    Lisette Abu, PA-C Pager: [phone]   11/29/2012

## 2012-11-29 NOTE — Progress Notes (Signed)
PROGRESS NOTE  [name] [id number] DOB: [birth year] DOA: 11/28/2012 PCP: Estanislado Emms, MD  Brief narrative: 61 year old African American female admitted with shortness of breath with BNP of 35,557 and generalized body aches. She was admitted with volume overload/pulmonary edema and was diuresis. She states that she started having pain in her lower extremities about a week ago and then developed right elbow pain. She developed such severe pain yesterday and clawing of her right hand that she came to the emergency room with fever and was thought that she had a pneumonia. Chest x-ray at the time was equivocal and showed per report by radiology mid space disease on the right side. Clinically she presents much more volume overload given her CHF history as well as end-stage renal disease and and packs were discontinued 11/29/2012. General surgery was consulted 11/29/2012 given her leukocytosis/fever and recent history of acalculous cholecystitis that did not get drained on last admission, given the fact that she had moderate thrombocytopenia.   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 Percival Spanish for  Sob/Cardiomegally-Stress test was done   Admitted for L kidney transplant Pyelo 03/04/08-E.coli-noted MRSA Buttocks at that time  Admission 12/30/07 for Persisting L Lingular Pna  Admission 08/20/05 c Cervical Spondylosis C3-4  Admission for fever-Pna vs Viral  Admission 02/20/01 c Fever  Admission 11/17/00 with Severe spinal stenosis c L>Rlateral recess stenosis  Consultants:  Renal  Gen surgery-to see  Procedures:  Hand x-ray suggesting Pseudogout  Antibiotics:  None currently  Blood cultures urine cultures drawn 12/16 PM   Subjective   Alert pleasant oriented not in that much pain at present. Patient seen in conjunction with physician assistant from trauma surgery She denies any significant pain in her abdomen at present. She has eaten a full lunch. She just had dialysis last night. She denies any nausea vomiting or chest pain at present.   Objective    Interim History: No documented fever 103.0.  Telemetry: Normal sinus rhythm no red alarms  Objective: Filed Vitals:   11/29/12 0320 11/29/12 0421 11/29/12 0459 11/29/12 1008  BP: 108/62  117/68 131/75  Pulse: 70  73   Temp:   97.7 F (36.5 C)   TempSrc:   Oral   Resp: 17  18   Height:      Weight:  133 lb 4.8 oz (60.464 kg)    SpO2:   100%     Intake/Output Summary (Last 24 hours) at 11/29/12 1241 Last data filed at 11/29/12 1012  Gross per 24 hour  Intake    246 ml  Output   3288 ml  Net  -3042 ml    Exam:  General: Alert pleasant African American female about stated age. Mild pallor Cardiovascular: S1-S2 no murmur rub or gallop Respiratory: Clinically clear Abdomen: Slightly tender right lower quadrant however no tenderness in right upper quadrant. Bowel sounds seem intact. Skin diffuse joint swellings upper extremity Neuro grossly intact  Data Reviewed: Basic Metabolic Panel:  Lab [id number] 0505 11/28/12 1844 11/28/12 1000  NA 142 -- 138  K 3.8 -- 4.5  CL 101 -- 96  CO2 29 -- 23  GLUCOSE 187* -- 105*  BUN 13 -- 35*  CREATININE 3.35* 7.88* 7.45*  CALCIUM 8.6 -- 9.3  MG -- -- --  PHOS -- -- --  Liver Function Tests:  Lab 11/28/12 1000  AST 15  ALT 7  ALKPHOS 81  BILITOT 0.4  PROT 7.2  ALBUMIN 2.6*   No results found for this basename: LIPASE:5,AMYLASE:5 in the last 168 hours No results found for this basename: AMMONIA:5 in the last 168 hours CBC:  Lab 11/29/12 0505 11/28/12 1844 11/28/12 1000  WBC 10.7* 9.6 10.6*  NEUTROABS -- -- 7.5  HGB 8.9* 8.6* 9.4*  HCT 28.5* 27.5* 30.0*  MCV 98.6 98.2 99.3  PLT 196 177 185    Cardiac Enzymes:  Lab 11/28/12 1237  CKTOTAL --  CKMB --  CKMBINDEX --  TROPONINI <0.30   BNP: No components found with this basename: POCBNP:5 CBG: No results found for this basename: GLUCAP:5 in the last 168 hours  Recent Results (from the past 240 hour(s))  CULTURE, BLOOD (ROUTINE X 2)     Status: Normal (Preliminary result)   Collection Time   11/28/12 10:00 AM      Component Value Range Status Comment   Specimen Description BLOOD RIGHT ARM   Final    Special Requests BOTTLES DRAWN AEROBIC ONLY 4CC   Final    Culture  Setup Time 11/28/2012 14:03   Final    Culture     Final    Value:        BLOOD CULTURE RECEIVED NO GROWTH TO DATE CULTURE WILL BE HELD FOR 5 DAYS BEFORE ISSUING A FINAL NEGATIVE REPORT   Report Status PENDING   Incomplete   CULTURE, BLOOD (ROUTINE X 2)     Status: Normal (Preliminary result)   Collection Time   11/28/12 10:10 AM      Component Value Range Status Comment   Specimen Description BLOOD RIGHT ARM   Final    Special Requests BOTTLES DRAWN AEROBIC ONLY 5CC   Final    Culture  Setup Time 11/28/2012 14:03   Final    Culture     Final    Value:        BLOOD CULTURE RECEIVED NO GROWTH TO DATE CULTURE WILL BE HELD FOR 5 DAYS BEFORE ISSUING A FINAL NEGATIVE REPORT   Report Status PENDING   Incomplete      Studies:              All Imaging reviewed and is as per above notation   Scheduled Meds:   . allopurinol  150 mg Oral QHS  . darbepoetin (ARANESP) injection - DIALYSIS  100 mcg Intravenous Q Tue-HD  . doxazosin  8 mg Oral BID  . enoxaparin (LOVENOX) injection  30 mg Subcutaneous Q24H  . labetalol  600 mg Oral BID  . multivitamin  1 tablet Oral QHS  . predniSONE  15 mg Oral Q breakfast  . simvastatin  10 mg Oral q1800  . sodium chloride  3 mL Intravenous Q12H   Continuous Infusions:    Assessment/Plan: 1. Volume overload secondary to end-stage renal disease-see below 2. 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 Appreciate input. Patient has been weaned off of CellCept and prednisone over the past one year and started dialysis in November of 2012. She apparently is supposed to wean off of all of her immunomodulators because She is now end-stage renal disease and clearly her cadaveric transplant has failed-we will update Dr. Moshe Cipro to this fact. 3. Fever-unclear etiology-Likely Pseudogout. It appears that her hand x-rays showed? Pseudogout. This could be a possibility  given she has end-stage renal disease-her uric acid level done was 6.4. Would discontinue allopurinol 150 each bedtime-will start stress dose steroids Solu-Medrol 60 mg 3 times a day This should help with the pain in her hands-patient at bedside requests to change back stress dose steroids to by mouth prednisone. We will wean this to 40 mg daily for 5 days and she can follow with her outpatient primary care physician. 4. Left ventricular hypertrophy with EF 65-70%-outpatient followup with cardiologist 5. ? Recent cholecystitis, operation not performed 10/2012 secondary to thrombocytopenia-appreciate general surgery input. If this can be done electively during hospital stay this would be greatly appreciated by patient. General surgeon still to finalize decision regarding surgery 6. Hypertension-continue doxazosin 8 mg twice a day, labetalol 600 twice a day-blood pressures are stable at present. 7. Hyperlipidemia-will continue Zocor 10 mg daily.   Code Status: Full Family Communication: None at bedside Disposition Plan: Inpatient   Verneita Griffes, MD  [facility] Pager [phone] 11/29/2012, 12:41 PM    LOS: 1 day

## 2012-11-29 NOTE — Plan of Care (Signed)
Problem: Phase I Progression Outcomes Goal: Voiding-avoid urinary catheter unless indicated Outcome: Not Applicable Date Met:  99991111 Pt is anuric

## 2012-11-30 DIAGNOSIS — E8770 Fluid overload, unspecified: Secondary | ICD-10-CM | POA: Diagnosis present

## 2012-11-30 DIAGNOSIS — R509 Fever, unspecified: Secondary | ICD-10-CM | POA: Diagnosis present

## 2012-11-30 DIAGNOSIS — E8779 Other fluid overload: Secondary | ICD-10-CM

## 2012-11-30 DIAGNOSIS — M109 Gout, unspecified: Secondary | ICD-10-CM

## 2012-11-30 DIAGNOSIS — M1120 Other chondrocalcinosis, unspecified site: Secondary | ICD-10-CM | POA: Diagnosis present

## 2012-11-30 DIAGNOSIS — K810 Acute cholecystitis: Secondary | ICD-10-CM

## 2012-11-30 LAB — CBC WITH DIFFERENTIAL/PLATELET
Eosinophils Relative: 0 % (ref 0–5)
HCT: 26.5 % — ABNORMAL LOW (ref 36.0–46.0)
Hemoglobin: 8.5 g/dL — ABNORMAL LOW (ref 12.0–15.0)
Lymphocytes Relative: 18 % (ref 12–46)
Lymphs Abs: 1.6 10*3/uL (ref 0.7–4.0)
MCV: 96.7 fL (ref 78.0–100.0)
Monocytes Absolute: 1.6 10*3/uL — ABNORMAL HIGH (ref 0.1–1.0)
Platelets: 192 10*3/uL (ref 150–400)
RBC: 2.74 MIL/uL — ABNORMAL LOW (ref 3.87–5.11)
WBC: 9 10*3/uL (ref 4.0–10.5)

## 2012-11-30 LAB — RENAL FUNCTION PANEL
CO2: 27 mEq/L (ref 19–32)
Calcium: 9.6 mg/dL (ref 8.4–10.5)
GFR calc Af Amer: 8 mL/min — ABNORMAL LOW (ref >90)
Glucose, Bld: 100 mg/dL — ABNORMAL HIGH (ref 70–99)
Sodium: 138 mEq/L (ref 135–145)

## 2012-11-30 MED ORDER — ALTEPLASE 2 MG IJ SOLR: 2.0000 mg | Freq: Once | INTRAMUSCULAR | Status: DC | PRN

## 2012-11-30 MED ORDER — PENTAFLUOROPROP-TETRAFLUOROETH EX AERO: 1.0000 "application " | INHALATION_SPRAY | CUTANEOUS | Status: DC | PRN

## 2012-11-30 MED ORDER — LIDOCAINE-PRILOCAINE 2.5-2.5 % EX CREA: 1.0000 "application " | TOPICAL_CREAM | CUTANEOUS | Status: DC | PRN

## 2012-11-30 MED ORDER — SODIUM CHLORIDE 0.9 % IV SOLN: 100.0000 mL | INTRAVENOUS | Status: DC | PRN

## 2012-11-30 MED ORDER — PIPERACILLIN-TAZOBACTAM IN DEX 2-0.25 GM/50ML IV SOLN
2.2500 g | Freq: Three times a day (TID) | INTRAVENOUS | Status: DC
  Administered 2012-11-30 – 2012-12-01 (×2): 2.25 g via INTRAVENOUS
  Filled 2012-11-30 (×6): qty 50

## 2012-11-30 MED ORDER — HEPARIN SODIUM (PORCINE) 1000 UNIT/ML DIALYSIS: 1000.0000 [IU] | INTRAMUSCULAR | Status: DC | PRN

## 2012-11-30 MED ORDER — NEPRO/CARBSTEADY PO LIQD: 237.0000 mL | ORAL | Status: DC | PRN

## 2012-11-30 MED ORDER — LIDOCAINE HCL (PF) 1 % IJ SOLN: 5.0000 mL | INTRAMUSCULAR | Status: DC | PRN

## 2012-11-30 MED ORDER — HEPARIN SODIUM (PORCINE) 1000 UNIT/ML DIALYSIS: 20.0000 [IU]/kg | INTRAMUSCULAR | Status: DC | PRN

## 2012-11-30 NOTE — Progress Notes (Signed)
Subjective:  Food has no taste, but no nausea, vomiting, or abdominal pain; surgery tomorrow.  Objective: Vital signs in last 24 hours: Temp:  [97.7 F (36.5 C)-98.1 F (36.7 C)] 97.7 F (36.5 C) (12/18 0631) Pulse Rate:  [68-71] 68  (12/18 0631) Resp:  [17-20] 20  (12/18 0631) BP: (129-151)/(75-88) 145/84 mmHg (12/18 0631) SpO2:  [99 %-100 %] 100 % (12/18 0631) Weight:  [61.6 kg (135 lb 12.9 oz)] 61.6 kg (135 lb 12.9 oz) (12/18 0631) Weight change: -2.6 kg (-5 lb 11.7 oz)  Intake/Output from previous day: 12/17 0701 - 12/18 0700 In: 883 [P.O.:880; I.V.:3] Out: -  Intake/Output this shift: Total I/O In: 243 [P.O.:240; I.V.:3] Out: -  EXAM: General appearance:  Alert, in no apparent distress Resp: Decreased breath sounds, but clear Cardio:  RRR with Gr I/VI systolic murmur, no rub GI:  + BS, soft and nontender Extremities:  No edema Access:  AVF @ LUA with + bruit  Lab Results:  Basename 11/30/12 0630 11/29/12 0505  WBC 9.0 10.7*  HGB 8.5* 8.9*  HCT 26.5* 28.5*  PLT 192 196   BMET:  Basename 11/30/12 0630 11/29/12 0505 11/28/12 1000  NA 138 142 --  K 3.9 3.8 --  CL 96 101 --  CO2 27 29 --  GLUCOSE 100* 187* --  BUN 42* 13 --  CREATININE 5.84* 3.35* --  CALCIUM 9.6 8.6 --  ALBUMIN 2.5* -- 2.6*   No results found for this basename: PTH:2 in the last 72 hours Iron Studies: No results found for this basename: IRON,TIBC,TRANSFERRIN,FERRITIN in the last 72 hours  Dialysis Orders: [facility] .  EDW 61.5 kg HD Bath 2.0 k,2.0 ca Time 4 Heparin 6.2 ml. Access L U AVF BFR 300 DFR 800  Zemplar 7 mcg IV/HD Epogen 10000 Units IV/HD Venofer 50 mg weekly  Assessment/Plan: 1. Dyspnea - secondary to pulmonary edema per chest x-ray 12/16, interstitial edema improved per chest x-ray yesterday. 2. ESRD - HD on TTS @ [place]; K 3.9 today, last HD on 12/16.  HD today to prepare for surgery tomorrow. 3. HTN/Volume - BP 145/84 on Labetalol 600 mg bid and Doxazosin 8 mg bid;  wt 61.6 kg today (EDW 61.5 kg), but poor PO intake. Lower EDW at discharge. 4. Fever - WBCs up to 10.7, blood cultures 12/16 pending; hospitalized 11/24-11/27 for acute cholecystitis (high fever, RUQ pain, thick GB wall > 1cm, no stones) but did not have surgery reportedly due to low platelets 30K; plts now normal (192K); cholecystectomy per Dr. Excell Seltzer pending tomorrow. 5. Anemia - Hgb down to 8.5, s/p Aranesp 100 mcg on Mon. 6. Metabolic bone disease - Ca 9.6 (10.8 corrected), P 5.1; outpatient Zemplar 7 mcg, no current binders. 7. Gout - on Allopurinol. 8. Hx Thrombocytopenia - Plts now 192K, resolved.  9. 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    LOS: 2 days   LYLES,CHARLES 11/30/2012,10:49 AM  Patient seen and examined and agree with assessment and plan as above.  Kelly Splinter  MD [employer] [phone] pgr    [phone] cell 11/30/2012, 7:22 PM

## 2012-11-30 NOTE — Evaluation (Signed)
Physical Therapy Treatment Patient Details Name: [name] MRN: [id number] DOB: [birth year] Today's Date: 11/30/2012 Time: 1136-1200 PT Time Calculation (min): 24 min  PT Assessment / Plan / Recommendation Comments on Treatment Session   Ms. Sedberry is 61 y/o female admitted admitted with shortness of breath and volume overload in the setting of CHF. Also being treated for acute cholecystitis with cholecystectomy planned for tomorrow. Presents to PT today with generalized weakness and decreased activity tolerance affecting independence with functional mobility. Will benefit physical therapy int he acute setting to maxmimize mobility for safe dc home. Will defer PT f/u recommendations until after surgery tomorrow as her functional status may change.     Follow Up Recommendations   (tbd after cholecystectomy)     Does the patient have the potential to tolerate intense rehabilitation     Barriers to Discharge        Equipment Recommendations  None recommended by PT    Recommendations for Other Services    Frequency Min 3X/week   Plan      Precautions / Restrictions Precautions Precautions: Fall Restrictions Weight Bearing Restrictions: No       Mobility  Bed Mobility Bed Mobility: Supine to Sit Supine to Sit: 6: Modified independent (Device/Increase time);HOB elevated (30 degrees) Transfers Transfers: Sit to Stand;Stand to Sit Sit to Stand: 5: Supervision;With upper extremity assist;From bed Stand to Sit: 5: Supervision;With upper extremity assist;To chair/3-in-1;With armrests Details for Transfer Assistance: supervision for safety Ambulation/Gait Ambulation/Gait Assistance: 4: Min guard;4: Min assist Ambulation Distance (Feet): 400 Feet Assistive device: None Ambulation/Gait Assistance Details: gaurding for stability and minA tactile assist to catch her balance occasionally Gait Pattern: Step-through pattern;Narrow base of support Gait velocity: decreased General  Gait Details: occasional stagger especially in stance on the left with increased lateral sway and difficulty correcting    Exercises     PT Diagnosis: Abnormality of gait;Generalized weakness  PT Problem List: Decreased activity tolerance;Decreased balance;Cardiopulmonary status limiting activity PT Treatment Interventions: DME instruction;Gait training;Stair training;Functional mobility training;Therapeutic activities;Therapeutic exercise;Balance training;Patient/family education;Neuromuscular re-education   PT Goals Acute Rehab PT Goals PT Goal Formulation: With patient Time For Goal Achievement: 12/07/12 Potential to Achieve Goals: Good Pt will go Sit to Stand: Independently PT Goal: Sit to Stand - Progress: Goal set today Pt will go Stand to Sit: Independently PT Goal: Stand to Sit - Progress: Goal set today Pt will Transfer Bed to Chair/Chair to Bed: Independently PT Transfer Goal: Bed to Chair/Chair to Bed - Progress: Goal set today Pt will Ambulate: >150 feet;Independently;with least restrictive assistive device PT Goal: Ambulate - Progress: Goal set today Pt will Go Up / Down Stairs: 1-2 stairs;with modified independence;with rail(s) PT Goal: Up/Down Stairs - Progress: Goal set today Pt will Perform Home Exercise Program: Independently PT Goal: Perform Home Exercise Program - Progress: Goal set today  Visit Information  Last PT Received On: 11/30/12 Assistance Needed: +1    Subjective Data  Subjective: I've been up walking, I just feel weak.  Patient Stated Goal: to eat and food to taste good   Cognition  Overall Cognitive Status: Appears within functional limits for tasks assessed/performed Arousal/Alertness: Awake/alert Orientation Level: Appears intact for tasks assessed Behavior During Session: [facility] for tasks performed    Balance     End of Session PT - End of Session Equipment Utilized During Treatment: Gait belt Activity Tolerance: Patient tolerated treatment  well;Patient limited by fatigue Patient left: in chair;with call bell/phone within reach;with family/visitor present Nurse Communication: Mobility status  GP Functional Limitation: Mobility: Walking and moving around Mobility: Walking and Moving Around Current Status [phone]): At least 1 percent but less than 20 percent impaired, limited or restricted Mobility: Walking and Moving Around Goal Status [phone]): 0 percent impaired, limited or restricted   [place] 11/30/2012, 2:38 PM

## 2012-11-30 NOTE — Progress Notes (Signed)
Subjective: Appears to be in NAD this morning, talking on phone. Offers no new c/o. States that she remains " sore" in RUQ of abdomen. Has been tolerating a diet.  Objective: Vital signs in last 24 hours: Temp:  [97.7 F (36.5 C)-98.1 F (36.7 C)] 97.7 F (36.5 C) (12/18 0631) Pulse Rate:  [68-71] 68  (12/18 0631) Resp:  [17-20] 20  (12/18 0631) BP: (129-145)/(75-88) 145/84 mmHg (12/18 0631) SpO2:  [99 %-100 %] 100 % (12/18 0631) Weight:  [135 lb 12.9 oz (61.6 kg)] 135 lb 12.9 oz (61.6 kg) (12/18 0631) Last BM Date: 11/29/12  Intake/Output from previous day: 12/17 0701 - 12/18 0700 In: 73 [P.O.:880; I.V.:3] Out: -  Intake/Output this shift:    General appearance: alert, cooperative, appears stated age and no distress Chest: CTA Cardiac: RRR Abdomen: soft, slightly tender in RUQ with deep palpation, denies N/V/D; has been tolerating diet w/o exacerbation of RUQ pain with meals. Extremities: warm to touch, no edema or tenderness, + pulses. Labs: wbc has trended down, H&H stable, BUN and creatine elevated (Hx of CKD) VSS, afebrile.  Lab Results:   [facility] 11/30/12 0630 11/29/12 0505  WBC 9.0 10.7*  HGB 8.5* 8.9*  HCT 26.5* 28.5*  PLT 192 196   BMET  Basename 11/30/12 0630 11/29/12 0505  NA 138 142  K 3.9 3.8  CL 96 101  CO2 27 29  GLUCOSE 100* 187*  BUN 42* 13  CREATININE 5.84* 3.35*  CALCIUM 9.6 8.6   PT/INR  Basename 11/29/12 1455  LABPROT 14.8  INR 1.18   ABG No results found for this basename: PHART:2,PCO2:2,PO2:2,HCO3:2 in the last 72 hours  Studies/Results: Dg Chest 2 View  11/28/2012  *RADIOLOGY REPORT*  Clinical Data: Chest pain and weakness.  CHEST - 2 VIEW  Comparison: 11/06/2012.  Findings: The heart is enlarged but stable.  There is tortuosity, ectasia and calcification of the thoracic aorta.  The pulmonary hila are slightly prominent but unchanged.  There are increased interstitial markings, Kerley B lines and peribronchial thickening  consistent with interstitial pulmonary edema.  Small bilateral pleural effusions are noted.  No focal infiltrate.  IMPRESSION:  Mild CHF.   Original Report Authenticated By: Marijo Sanes, M.D.    Dg Chest Port 1 View  11/29/2012  *RADIOLOGY REPORT*  Clinical Data: Slight shortness of breath and left-sided chest pressure  PORTABLE CHEST - 1 VIEW  Comparison: Chest x-ray of 11/28/2012  Findings:   The interstitial edema pattern has improved.  Moderate cardiomegaly remains with minimal pulmonary vascular congestion. No bony abnormality is seen.  IMPRESSION: Improvement in interstitial edema.   Original Report Authenticated By: Ivar Drape, M.D.    Dg Hand Complete Left  11/28/2012  *RADIOLOGY REPORT*  Clinical Data: Left hand pain and joint swelling without injury.  LEFT HAND - COMPLETE 3+ VIEW  Comparison: None.  Findings: Mild osteopenia. No acute fracture or dislocation. Extensive vascular calcifications. Amorphous calcifications about the radial volar aspect of the wrist may be the site of an AV fistula.  There are probable concurrent calcifications in the triangular fibrocartilage.  IMPRESSION: No acute osseous abnormality.  Probable calcification in the triangular fibrocartilage, suggesting calcium pyrophosphate deposition disease.  Extensive vascular calcifications,  including likely within a radial  fistula.   Original Report Authenticated By: Abigail Miyamoto, M.D.     Anti-infectives: Anti-infectives    None      Assessment/Plan: s/p * No surgery found *  Patient Active Problem List  Diagnosis  . Abnormal CXR  .  Dyspnea  . Cholecystitis, acute  . ESRD (end stage renal disease)  . HTN (hypertension)  . GERD (gastroesophageal reflux disease)  . Anemia  . Thrombocytopenia  . Pulmonary edema  . URI (upper respiratory infection)  Management per medicine team  Plan: Prorbable cholecystectomy this admission Will discuss with medicine team as to timing, given her other medical  issues.   LOS: 2 days    Hartley Barefoot [facility] Surgery Pager # [phone] 11/30/2012

## 2012-11-30 NOTE — Progress Notes (Signed)
[facility] PROGRESS NOTE  [name] [id number] DOB: May 07, 1951 DOA: 11/28/2012 PCP: Estanislado Emms, MD  Assessment/Plan:  #1 volume overload/pulmonary edema Likely secondary to end-stage renal disease. Clinical improvement post hemodialysis. Patient does have a history of left, Derek renal transplant in 2001. Patient is currently being weaned off CellCept and prednisone over the past year. Patient started dialysis at November 2012. Continue hemodialysis. Per renal.  #2 end-stage renal disease on hemodialysis on Tuesday and Thursdays at [facility].  Potassium of 3.9 today. Patient's last hemodialysis was 12/16. Patient for hemodialysis today in preparation for probable surgery tomorrow.  #3 hypertension Stable. Continue labetalol, Dr. Lajean Silvius. Follow.  #4 fever Portable etiology. May be secondary to gout versus pseudogout versus acute cholecystitis. Blood cultures are pending. Patient was evaluated by general surgery during her last hospitalization but due to thrombocytopenia cholecystectomy was delayed. Patient's thrombocytopenia has since resolved. Patient is currently afebrile. Patient for possible cholecystectomy tomorrow per general surgery. We'll place empirically on IV Zosyn.  #5 gout versus pseudogout Clinical improvement. Continue short steroid burst.  #6 anemia Likely secondary to anemia of chronic disease. Hemoglobin is 8.5. Continue current meds. Per renal.  #7 history of failed renal transplant Patient restarted hemodialysis 11 2012. Currently on a short steroid burst.  #8 hyperlipidemia Zocor.  #9 questionable subacute versus acute cholecystitis. During last hospitalization due to thrombocytopenia cholecystectomy was delayed. Thrombocytopenia has since resolved. Patient is being followed by general surgery and for probable cholecystectomy tomorrow. Per general surgery.  Code Status: Full Family Communication: Updated patient. Disposition Plan: Home when  medically stable   Consultants:  Nephrology: Dr. Jonnie Finner 11/28/2012  General surgery Dr. Excell Seltzer 11/29/2012  Procedures:  CXR 11/29/12, 11/28/12  XRAY 11/28/12  Antibiotics:  Zosyn IV 11/30/12  HPI/Subjective: Patient complaining of brash and reflux symptoms. Patient states SOB improved from admission. Patient in HD  Objective: Filed Vitals:   11/30/12 1255 11/30/12 1300 11/30/12 1328 11/30/12 1400  BP: 135/85 136/82 138/80 146/81  Pulse: 71 69 68 69  Temp: 97.8 F (36.6 C)     TempSrc: Oral     Resp: 18     Height:      Weight: 62.5 kg (137 lb 12.6 oz)     SpO2: 94%       Intake/Output Summary (Last 24 hours) at 11/30/12 1500 Last data filed at 11/30/12 1032  Gross per 24 hour  Intake    643 ml  Output      0 ml  Net    643 ml   Filed Weights   11/29/12 0421 11/30/12 0631 11/30/12 1255  Weight: 60.464 kg (133 lb 4.8 oz) 61.6 kg (135 lb 12.9 oz) 62.5 kg (137 lb 12.6 oz)    Exam:   General:  NAD  Cardiovascular: RRR no m/r/g. No LE edema.  Respiratory: CTAB anterior lung fields  Abdomen: Soft/ND/+BS/ Some TTP in RUQ/  Data Reviewed: Basic Metabolic Panel:  Lab [id number] 0630 11/29/12 0505 11/28/12 1844 11/28/12 1000  NA 138 142 -- 138  K 3.9 3.8 -- 4.5  CL 96 101 -- 96  CO2 27 29 -- 23  GLUCOSE 100* 187* -- 105*  BUN 42* 13 -- 35*  CREATININE 5.84* 3.35* 7.88* 7.45*  CALCIUM 9.6 8.6 -- 9.3  MG -- -- -- --  PHOS 5.1* -- -- --   Liver Function Tests:  Lab 11/30/12 0630 11/28/12 1000  AST -- 15  ALT -- 7  ALKPHOS -- 81  BILITOT -- 0.4  PROT --  7.2  ALBUMIN 2.5* 2.6*   No results found for this basename: LIPASE:5,AMYLASE:5 in the last 168 hours No results found for this basename: AMMONIA:5 in the last 168 hours CBC:  Lab 11/30/12 0630 11/29/12 0505 11/28/12 1844 11/28/12 1000  WBC 9.0 10.7* 9.6 10.6*  NEUTROABS 5.8 -- -- 7.5  HGB 8.5* 8.9* 8.6* 9.4*  HCT 26.5* 28.5* 27.5* 30.0*  MCV 96.7 98.6 98.2 99.3  PLT 192 196 177 185    Cardiac Enzymes:  Lab 11/28/12 1237  CKTOTAL --  CKMB --  CKMBINDEX --  TROPONINI <0.30   BNP (last 3 results)  Basename 11/28/12 1237  PROBNP 35557.0*   CBG: No results found for this basename: GLUCAP:5 in the last 168 hours  Recent Results (from the past 240 hour(s))  CULTURE, BLOOD (ROUTINE X 2)     Status: Normal (Preliminary result)   Collection Time   11/28/12 10:00 AM      Component Value Range Status Comment   Specimen Description BLOOD RIGHT ARM   Final    Special Requests BOTTLES DRAWN AEROBIC ONLY 4CC   Final    Culture  Setup Time 11/28/2012 14:03   Final    Culture     Final    Value:        BLOOD CULTURE RECEIVED NO GROWTH TO DATE CULTURE WILL BE HELD FOR 5 DAYS BEFORE ISSUING A FINAL NEGATIVE REPORT   Report Status PENDING   Incomplete   CULTURE, BLOOD (ROUTINE X 2)     Status: Normal (Preliminary result)   Collection Time   11/28/12 10:10 AM      Component Value Range Status Comment   Specimen Description BLOOD RIGHT ARM   Final    Special Requests BOTTLES DRAWN AEROBIC ONLY 5CC   Final    Culture  Setup Time 11/28/2012 14:03   Final    Culture     Final    Value:        BLOOD CULTURE RECEIVED NO GROWTH TO DATE CULTURE WILL BE HELD FOR 5 DAYS BEFORE ISSUING A FINAL NEGATIVE REPORT   Report Status PENDING   Incomplete      Studies: Dg Chest Port 1 View  11/29/2012  *RADIOLOGY REPORT*  Clinical Data: Slight shortness of breath and left-sided chest pressure  PORTABLE CHEST - 1 VIEW  Comparison: Chest x-ray of 11/28/2012  Findings:   The interstitial edema pattern has improved.  Moderate cardiomegaly remains with minimal pulmonary vascular congestion. No bony abnormality is seen.  IMPRESSION: Improvement in interstitial edema.   Original Report Authenticated By: Ivar Drape, M.D.    Dg Hand Complete Left  11/28/2012  *RADIOLOGY REPORT*  Clinical Data: Left hand pain and joint swelling without injury.  LEFT HAND - COMPLETE 3+ VIEW  Comparison: None.   Findings: Mild osteopenia. No acute fracture or dislocation. Extensive vascular calcifications. Amorphous calcifications about the radial volar aspect of the wrist may be the site of an AV fistula.  There are probable concurrent calcifications in the triangular fibrocartilage.  IMPRESSION: No acute osseous abnormality.  Probable calcification in the triangular fibrocartilage, suggesting calcium pyrophosphate deposition disease.  Extensive vascular calcifications,  including likely within a radial  fistula.   Original Report Authenticated By: Abigail Miyamoto, M.D.     Scheduled Meds:    . darbepoetin (ARANESP) injection - DIALYSIS  100 mcg Intravenous Q Tue-HD  . doxazosin  8 mg Oral BID  . enoxaparin (LOVENOX) injection  30 mg Subcutaneous Q24H  .  labetalol  600 mg Oral BID  . multivitamin  1 tablet Oral QHS  . predniSONE  40 mg Oral QAC breakfast  . simvastatin  10 mg Oral q1800  . sodium chloride  3 mL Intravenous Q12H   Continuous Infusions:   Principal Problem:  *Pulmonary edema Active Problems:  Dyspnea  Cholecystitis, acute  ESRD (end stage renal disease)  HTN (hypertension)  GERD (gastroesophageal reflux disease)  Anemia  Thrombocytopenia  URI (upper respiratory infection)  Fever  Gout flare  Volume overload  Pseudogout    Time spent: > 35 mins    [facility] Pager [phone]. If 8PM-8AM, please contact night-coverage at [email or web address], password [facility] 11/30/2012, 3:00 PM  LOS: 2 days

## 2012-11-30 NOTE — Progress Notes (Signed)
ANTIBIOTIC CONSULT NOTE - INITIAL  Pharmacy Consult for Zosyn Indication: cholecystitis  Allergies  Allergen Reactions  . Sulfa Antibiotics     Both parents allergic-so will not take    Patient Measurements: Height: 5\' 2"  (157.5 cm) Weight: 137 lb 12.6 oz (62.5 kg) IBW/kg (Calculated) : 50.1  Adjusted Body Weight: n/a  Vital Signs: Temp: 97.8 F (36.6 C) (12/18 1255) Temp src: Oral (12/18 1255) BP: 151/92 mmHg (12/18 1600) Pulse Rate: 67  (12/18 1600) Intake/Output from previous day: 12/17 0701 - 12/18 0700 In: 883 [P.O.:880; I.V.:3] Out: -  Intake/Output from this shift: Total I/O In: 243 [P.O.:240; I.V.:3] Out: -   Labs:  Basename 11/30/12 0630 11/29/12 0505 11/28/12 1844  WBC 9.0 10.7* 9.6  HGB 8.5* 8.9* 8.6*  PLT 192 196 177  LABCREA -- -- --  CREATININE 5.84* 3.35* 7.88*   Estimated Creatinine Clearance: 8.7 ml/min (by C-G formula based on Cr of 5.84). No results found for this basename: VANCOTROUGH:2,VANCOPEAK:2,VANCORANDOM:2,GENTTROUGH:2,GENTPEAK:2,GENTRANDOM:2,TOBRATROUGH:2,TOBRAPEAK:2,TOBRARND:2,AMIKACINPEAK:2,AMIKACINTROU:2,AMIKACIN:2, in the last 72 hours   Microbiology: Recent Results (from the past 720 hour(s))  MRSA PCR SCREENING     Status: Normal   Collection Time   11/07/12  3:59 AM      Component Value Range Status Comment   MRSA by PCR NEGATIVE  NEGATIVE Final   CULTURE, BLOOD (ROUTINE X 2)     Status: Normal   Collection Time   11/07/12  6:47 AM      Component Value Range Status Comment   Specimen Description BLOOD RIGHT ARM   Final    Special Requests BOTTLES DRAWN AEROBIC AND ANAEROBIC 10CC   Final    Culture  Setup Time 11/07/2012 16:28   Final    Culture NO GROWTH 5 DAYS   Final    Report Status 11/13/2012 FINAL   Final   CULTURE, BLOOD (ROUTINE X 2)     Status: Normal   Collection Time   11/07/12  7:05 AM      Component Value Range Status Comment   Specimen Description BLOOD RIGHT FOREARM   Final    Special Requests     Final     Value: BOTTLES DRAWN AEROBIC AND ANAEROBIC AERO 6CC ANA 5CC   Culture  Setup Time 11/07/2012 16:28   Final    Culture NO GROWTH 5 DAYS   Final    Report Status 11/13/2012 FINAL   Final   CULTURE, BLOOD (ROUTINE X 2)     Status: Normal (Preliminary result)   Collection Time   11/28/12 10:00 AM      Component Value Range Status Comment   Specimen Description BLOOD RIGHT ARM   Final    Special Requests BOTTLES DRAWN AEROBIC ONLY 4CC   Final    Culture  Setup Time 11/28/2012 14:03   Final    Culture     Final    Value:        BLOOD CULTURE RECEIVED NO GROWTH TO DATE CULTURE WILL BE HELD FOR 5 DAYS BEFORE ISSUING A FINAL NEGATIVE REPORT   Report Status PENDING   Incomplete   CULTURE, BLOOD (ROUTINE X 2)     Status: Normal (Preliminary result)   Collection Time   11/28/12 10:10 AM      Component Value Range Status Comment   Specimen Description BLOOD RIGHT ARM   Final    Special Requests BOTTLES DRAWN AEROBIC ONLY 5CC   Final    Culture  Setup Time 11/28/2012 14:03   Final  Culture     Final    Value:        BLOOD CULTURE RECEIVED NO GROWTH TO DATE CULTURE WILL BE HELD FOR 5 DAYS BEFORE ISSUING A FINAL NEGATIVE REPORT   Report Status PENDING   Incomplete     Medical History: Past Medical History  Diagnosis Date  . Anemia   . Anxiety   . Blood transfusion   . Depression   . GERD (gastroesophageal reflux disease)   . Hyperlipidemia   . Hypertension   . Chronic kidney disease     transplant 1996  . Neuromuscular disorder     neuropathy hand and legs  . Weight loss, unintentional   . Adenomatous polyp of colon 10/2010  . Diverticula, colon   . Heart murmur   . CHF (congestive heart failure)     Medications:  Scheduled:    . darbepoetin (ARANESP) injection - DIALYSIS  100 mcg Intravenous Q Tue-HD  . doxazosin  8 mg Oral BID  . enoxaparin (LOVENOX) injection  30 mg Subcutaneous Q24H  . labetalol  600 mg Oral BID  . multivitamin  1 tablet Oral QHS  .  piperacillin-tazobactam (ZOSYN)  IV  2.25 g Intravenous Q8H  . predniSONE  40 mg Oral QAC breakfast  . simvastatin  10 mg Oral q1800  . sodium chloride  3 mL Intravenous Q12H  . [DISCONTINUED] allopurinol  150 mg Oral QHS   Assessment: 61 yo female with ESRD who is having fevers (Tm 102.6); differential includes acute cholecystitis.  Cultures pending, may go for possible cholecystectomy tomorrow.  Goal of Therapy:  Resoultion of infection  Plan:  1. Zosyn 2.25g IV q8 hrs. 2. Will f/u cultures, surgery plans.  [name] 11/30/2012,4:07 PM

## 2012-11-30 NOTE — Progress Notes (Signed)
Patient interviewed and examined, agree with NP note above.  Edward Jolly MD, FACS  11/30/2012 11:59 AM

## 2012-12-01 DIAGNOSIS — R509 Fever, unspecified: Secondary | ICD-10-CM

## 2012-12-01 LAB — RENAL FUNCTION PANEL
BUN: 26 mg/dL — ABNORMAL HIGH (ref 6–23)
CO2: 32 mEq/L (ref 19–32)
Chloride: 98 mEq/L (ref 96–112)
Glucose, Bld: 92 mg/dL (ref 70–99)
Potassium: 4.4 mEq/L (ref 3.5–5.1)

## 2012-12-01 LAB — CBC
HCT: 26.2 % — ABNORMAL LOW (ref 36.0–46.0)
Hemoglobin: 8.2 g/dL — ABNORMAL LOW (ref 12.0–15.0)
MCHC: 31.3 g/dL (ref 30.0–36.0)

## 2012-12-01 LAB — SURGICAL PCR SCREEN: MRSA, PCR: NEGATIVE

## 2012-12-01 MED ORDER — PIPERACILLIN-TAZOBACTAM IN DEX 2-0.25 GM/50ML IV SOLN
2.2500 g | Freq: Three times a day (TID) | INTRAVENOUS | Status: DC
  Administered 2012-12-01 – 2012-12-03 (×5): 2.25 g via INTRAVENOUS
  Filled 2012-12-01 (×9): qty 50

## 2012-12-01 MED ORDER — SODIUM CHLORIDE 0.9 % IV SOLN: 250.0000 mL | INTRAVENOUS | Status: DC | PRN

## 2012-12-01 NOTE — Progress Notes (Signed)
Pt is nervous for the upcoming procedure. Emotional support given and walk with her in the hallway. Pt stated " I feel relieved now and more relaxed" after walking with her in the hallway.

## 2012-12-01 NOTE — Progress Notes (Signed)
Patient interviewed and examined, agree with PA note above. Discussed proceeding with laparoscopic cholecystectomy tomorrow and she is in agreement. Edward Jolly MD, FACS  12/01/2012 6:30 PM

## 2012-12-01 NOTE — Progress Notes (Signed)
Have stopped IVF"s started by surgery-- she is at high risk of CHF given her ESRD status and would not give her fluids. Thanks  Kelly Splinter  MD [employer] [phone] pgr    [phone] cell 12/01/2012, 10:20 AM

## 2012-12-01 NOTE — Progress Notes (Signed)
[facility] PROGRESS NOTE  [name] [id number] DOB: [birth year] DOA: 11/28/2012 PCP: Estanislado Emms, MD  Assessment/Plan:  #1 volume overload/pulmonary edema Likely secondary to end-stage renal disease. Clinical improvement post hemodialysis. Patient does have a history of left cavaderic renal transplant in 2001. Patient is currently being weaned off CellCept and prednisone over the past year. Patient started dialysis at November 2012. Continue hemodialysis. Per renal.  #2 end-stage renal disease on hemodialysis on Tuesday and Thursdays at [facility].  Potassium of 4.4 today. Patient's last hemodialysis was 12/18. Per renal  #3 hypertension Stable. Continue labetalol, CARDURA. Follow.  #4 fever Portable etiology. May be secondary to gout versus pseudogout versus acute cholecystitis. Blood cultures are pending. Patient was evaluated by general surgery during her last hospitalization but due to thrombocytopenia cholecystectomy was delayed. Patient's thrombocytopenia has since resolved. Patient is currently afebrile. Patient for possible cholecystectomy tomorrow per general surgery. Continue IV Zosyn.  #5 gout versus pseudogout Clinical improvement. Continue short steroid burst.  #6 anemia Likely secondary to anemia of chronic disease. Hemoglobin is 8.2. Continue current meds. Per renal.  #7 history of failed renal transplant Patient restarted hemodialysis 11/ 2012. Currently on a short steroid burst.  #8 hyperlipidemia Zocor.  #9 questionable subacute versus acute cholecystitis. During last hospitalization due to thrombocytopenia cholecystectomy was delayed. Thrombocytopenia has since resolved. Patient is being followed by general surgery for probable cholecystectomy tomorrow. Continue empiric IV Zosyn. Per general surgery.  Code Status: Full Family Communication: Updated patient and mother at bedside. Disposition Plan: Home when medically  stable   Consultants:  Nephrology: Dr. Jonnie Finner 11/28/2012  General surgery Dr. Excell Seltzer 11/29/2012  Procedures:  CXR 11/29/12, 11/28/12  XRAY 11/28/12  Antibiotics:  Zosyn IV 11/30/12  HPI/Subjective:  Patient states SOB improved from admission. Patient still with some right upper quadrant pain.   Objective: Filed Vitals:   12/01/12 0425 12/01/12 1049 12/01/12 1053 12/01/12 1357  BP: 151/89 168/88  125/66  Pulse: 64  78 70  Temp: 97 F (36.1 C)   97.9 F (36.6 C)  TempSrc: Oral   Oral  Resp: 18   18  Height:      Weight: 61.1 kg (134 lb 11.2 oz)     SpO2: 98%   92%    Intake/Output Summary (Last 24 hours) at 12/01/12 1641 Last data filed at 12/01/12 1358  Gross per 24 hour  Intake    340 ml  Output   2072 ml  Net  -1732 ml   Filed Weights   11/30/12 1255 11/30/12 1703 12/01/12 0425  Weight: 62.5 kg (137 lb 12.6 oz) 60.1 kg (132 lb 7.9 oz) 61.1 kg (134 lb 11.2 oz)    Exam:   General:  NAD  Cardiovascular: RRR no m/r/g. No LE edema.  Respiratory: CTAB anterior lung fields  Abdomen: Soft/ND/+BS/ Some TTP in RUQ/  Data Reviewed: Basic Metabolic Panel:  Lab [id number] 0530 11/30/12 0630 11/29/12 0505 11/28/12 1844 11/28/12 1000  NA 140 138 142 -- 138  K 4.4 3.9 3.8 -- 4.5  CL 98 96 101 -- 96  CO2 32 27 29 -- 23  GLUCOSE 92 100* 187* -- 105*  BUN 26* 42* 13 -- 35*  CREATININE 3.65* 5.84* 3.35* 7.88* 7.45*  CALCIUM 9.2 9.6 8.6 -- 9.3  MG -- -- -- -- --  PHOS 3.1 5.1* -- -- --   Liver Function Tests:  Lab 12/01/12 0530 11/30/12 0630 11/28/12 1000  AST -- -- 15  ALT -- -- 7  ALKPHOS -- -- 81  BILITOT -- -- 0.4  PROT -- -- 7.2  ALBUMIN 2.4* 2.5* 2.6*   No results found for this basename: LIPASE:5,AMYLASE:5 in the last 168 hours No results found for this basename: AMMONIA:5 in the last 168 hours CBC:  Lab 12/01/12 0530 11/30/12 0630 11/29/12 0505 11/28/12 1844 11/28/12 1000  WBC 8.0 9.0 10.7* 9.6 10.6*  NEUTROABS -- 5.8 -- -- 7.5  HGB  8.2* 8.5* 8.9* 8.6* 9.4*  HCT 26.2* 26.5* 28.5* 27.5* 30.0*  MCV 98.1 96.7 98.6 98.2 99.3  PLT 203 192 196 177 185   Cardiac Enzymes:  Lab 11/28/12 1237  CKTOTAL --  CKMB --  CKMBINDEX --  TROPONINI <0.30   BNP (last 3 results)  Basename 11/28/12 1237  PROBNP 35557.0*   CBG: No results found for this basename: GLUCAP:5 in the last 168 hours  Recent Results (from the past 240 hour(s))  CULTURE, BLOOD (ROUTINE X 2)     Status: Normal (Preliminary result)   Collection Time   11/28/12 10:00 AM      Component Value Range Status Comment   Specimen Description BLOOD RIGHT ARM   Final    Special Requests BOTTLES DRAWN AEROBIC ONLY 4CC   Final    Culture  Setup Time 11/28/2012 14:03   Final    Culture     Final    Value:        BLOOD CULTURE RECEIVED NO GROWTH TO DATE CULTURE WILL BE HELD FOR 5 DAYS BEFORE ISSUING A FINAL NEGATIVE REPORT   Report Status PENDING   Incomplete   CULTURE, BLOOD (ROUTINE X 2)     Status: Normal (Preliminary result)   Collection Time   11/28/12 10:10 AM      Component Value Range Status Comment   Specimen Description BLOOD RIGHT ARM   Final    Special Requests BOTTLES DRAWN AEROBIC ONLY 5CC   Final    Culture  Setup Time 11/28/2012 14:03   Final    Culture     Final    Value:        BLOOD CULTURE RECEIVED NO GROWTH TO DATE CULTURE WILL BE HELD FOR 5 DAYS BEFORE ISSUING A FINAL NEGATIVE REPORT   Report Status PENDING   Incomplete      Studies: No results found.  Scheduled Meds:    . darbepoetin (ARANESP) injection - DIALYSIS  100 mcg Intravenous Q Tue-HD  . doxazosin  8 mg Oral BID  . enoxaparin (LOVENOX) injection  30 mg Subcutaneous Q24H  . labetalol  600 mg Oral BID  . multivitamin  1 tablet Oral QHS  . piperacillin-tazobactam (ZOSYN)  IV  2.25 g Intravenous Q8H  . predniSONE  40 mg Oral QAC breakfast  . simvastatin  10 mg Oral q1800  . sodium chloride  3 mL Intravenous Q12H   Continuous Infusions:   Principal Problem:  *Pulmonary  edema Active Problems:  Dyspnea  Cholecystitis, acute  ESRD (end stage renal disease)  HTN (hypertension)  GERD (gastroesophageal reflux disease)  Anemia  Thrombocytopenia  URI (upper respiratory infection)  Fever  Gout flare  Volume overload  Pseudogout    Time spent: > 35 mins    [facility] Pager [phone]. If 8PM-8AM, please contact night-coverage at [email or web address], password [facility] 12/01/2012, 4:41 PM  LOS: 3 days

## 2012-12-01 NOTE — Progress Notes (Signed)
Patient ID: [name], female   DOB: 12/07/1951, 61 y.o.   MRN: [id number]    Subjective: Appears to be in NAD this morning, Offers no new c/o. States that she remains " sore" in RUQ of abdomen. Has been NPO since dialysis yesterday. Has been having loose BM's per patient.  Objective: Vital signs in last 24 hours: Temp:  [97 F (36.1 C)-98.1 F (36.7 C)] 97 F (36.1 C) (12/19 0425) Pulse Rate:  [64-76] 64  (12/19 0425) Resp:  [17-18] 18  (12/19 0425) BP: (135-170)/(50-94) 151/89 mmHg (12/19 0425) SpO2:  [94 %-100 %] 98 % (12/19 0425) Weight:  [132 lb 7.9 oz (60.1 kg)-137 lb 12.6 oz (62.5 kg)] 134 lb 11.2 oz (61.1 kg) (12/19 0425) Last BM Date: 11/29/12  Intake/Output from previous day: 12/18 0701 - 12/19 0700 In: 413 [P.O.:300; I.V.:13; IV Piggyback:100] Out: 2072  Intake/Output this shift:    General appearance: alert, cooperative, appears stated age and no distress Chest: CTA Cardiac: RRR Abdomen: soft, slightly tender in RUQ with deep palpation, denies N/V/D;  Extremities: warm to touch, no edema or tenderness, + pulses. Labs: wbc has trended down, H&H stable, BUN and creatine elevated (Hx of CKD) VSS, afebrile.  Lab Results:   Basename 12/01/12 0530 11/30/12 0630  WBC 8.0 9.0  HGB 8.2* 8.5*  HCT 26.2* 26.5*  PLT 203 192   BMET  Basename 12/01/12 0530 11/30/12 0630  NA 140 138  K 4.4 3.9  CL 98 96  CO2 32 27  GLUCOSE 92 100*  BUN 26* 42*  CREATININE 3.65* 5.84*  CALCIUM 9.2 9.6   PT/INR  Basename 11/29/12 1455  LABPROT 14.8  INR 1.18   ABG No results found for this basename: PHART:2,PCO2:2,PO2:2,HCO3:2 in the last 72 hours  Studies/Results: Dg Chest Port 1 View  11/29/2012  *RADIOLOGY REPORT*  Clinical Data: Slight shortness of breath and left-sided chest pressure  PORTABLE CHEST - 1 VIEW  Comparison: Chest x-ray of 11/28/2012  Findings:   The interstitial edema pattern has improved.  Moderate cardiomegaly remains with minimal pulmonary  vascular congestion. No bony abnormality is seen.  IMPRESSION: Improvement in interstitial edema.   Original Report Authenticated By: Ivar Drape, M.D.     Anti-infectives: Anti-infectives     Start     Dose/Rate Route Frequency Ordered Stop   11/30/12 1700   piperacillin-tazobactam (ZOSYN) IVPB 2.25 g        2.25 g 100 mL/hr over 30 Minutes Intravenous 3 times per day 11/30/12 1543            Assessment/Plan: s/p * No surgery found *  Patient Active Problem List  Diagnosis  . Abnormal CXR  . Dyspnea  . Cholecystitis, acute  . ESRD (end stage renal disease)  . HTN (hypertension)  . GERD (gastroesophageal reflux disease)  . Anemia  . Thrombocytopenia  . Pulmonary edema  . URI (upper respiratory infection)  . Fever  . Gout flare  . Volume overload  . Pseudogout  Management per medicine team  Plan: Keep NPO for now, restart IVF for hydration Possible lap chole today (will discuss with Dr. Sherrin Daisy)   LOS: 3 days    Hartley Barefoot [facility] Pager # [phone] 12/01/2012

## 2012-12-01 NOTE — Progress Notes (Signed)
Subjective:   NPO for possible cholecystectomy today; no abdominal pain, nausea or vomiting.  Objective: Vital signs in last 24 hours: Temp:  [97 F (36.1 C)-98.1 F (36.7 C)] 97 F (36.1 C) (12/19 0425) Pulse Rate:  [64-76] 64  (12/19 0425) Resp:  [17-18] 18  (12/19 0425) BP: (135-170)/(50-94) 151/89 mmHg (12/19 0425) SpO2:  [94 %-100 %] 98 % (12/19 0425) Weight:  [60.1 kg (132 lb 7.9 oz)-62.5 kg (137 lb 12.6 oz)] 61.1 kg (134 lb 11.2 oz) (12/19 0425) Weight change: 0.9 kg (1 lb 15.8 oz)  Intake/Output from previous day: 12/18 0701 - 12/19 0700 In: 413 [P.O.:300; I.V.:13; IV Piggyback:100] Out: 2072    EXAM: General appearance:  Alert, in no apparent distress Resp:  CTA without rales, rhonchi, or wheezes Cardio:  RRR with Gr I/VI systolic murmur, no rub GI:  + BS, soft and nontender Extremities:  No edema Access:  AVF @ LUA with + bruit  Lab Results:  Basename 12/01/12 0530 11/30/12 0630  WBC 8.0 9.0  HGB 8.2* 8.5*  HCT 26.2* 26.5*  PLT 203 192   BMET:  Basename 12/01/12 0530 11/30/12 0630  NA 140 138  K 4.4 3.9  CL 98 96  CO2 32 27  GLUCOSE 92 100*  BUN 26* 42*  CREATININE 3.65* 5.84*  CALCIUM 9.2 9.6  ALBUMIN 2.4* 2.5*   No results found for this basename: PTH:2 in the last 72 hours Iron Studies: No results found for this basename: IRON,TIBC,TRANSFERRIN,FERRITIN in the last 72 hours  Dialysis Orders: Center: [facility] on tts .  EDW 61.5 kg HD Bath 2.0 k,2.0 ca Time 4 Heparin 6.2 ml. Access L U AVF BFR 300 DFR 800  Zemplar 7 mcg IV/HD Epogen 10000 Units IV/HD Venofer 50 mg weekly  Assessment/Plan: 1. Dyspnea - secondary to pulmonary edema per chest x-ray 12/16, interstitial edema improved per chest x-ray 12/17.  2. ESRD - HD on TTS @ [place]; K 3.9 today, HD yesterday to prepare for surgery today.  3. HTN/Volume - BP 151/89 on Labetalol 600 mg bid and Doxazosin 8 mg bid; wt 61.1 kg today s/p net UF 2.1 L (EDW 61.5 kg).  Lower EDW at discharge.  4. Fever - WBCs  down to 8, blood cultures 12/16 with no growth; hospitalized 11/24-11/27 for acute cholecystitis (high fever, RUQ pain, thick GB wall > 1cm, no stones) but did not have surgery reportedly due to low platelets 30K; plts now normal (203K); cholecystectomy per Dr. Excell Seltzer pending today. She does not need maintenance fluids given ESRD, high risk for CHF with fluids. Keep saline locked.  5. Anemia - Hgb down to 8.2, s/p Aranesp 100 mcg on Mon.  6. Metabolic bone disease - Ca 9.2 (10.6 corrected), P 3.1; outpatient Zemplar 7 mcg, no current binders.  7. Gout - on Allopurinol.  8. Hx Thrombocytopenia - Plts now 203K, resolved.  9. 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    LOS: 3 days   LYLES,CHARLES 12/01/2012,9:26 AM  Patient seen and examined and agree with assessment and plan as above with additions as indicated. NPO for possible cholecystectomy today. Pulm edema on admission has resolved.  Kelly Splinter  MD [facility] [phone] pgr    [phone] cell 12/01/2012, 10:19 AM

## 2012-12-02 ENCOUNTER — Inpatient Hospital Stay (HOSPITAL_COMMUNITY): Payer: Medicare Other

## 2012-12-02 ENCOUNTER — Encounter (HOSPITAL_COMMUNITY)
Admission: EM | Disposition: A | Discharge status: Home / Self Care | Payer: Self-pay | Source: Ambulatory Visit | Attending: Internal Medicine

## 2012-12-02 ENCOUNTER — Encounter (HOSPITAL_COMMUNITY): Payer: Self-pay | Admitting: Anesthesiology

## 2012-12-02 ENCOUNTER — Inpatient Hospital Stay (HOSPITAL_COMMUNITY): Payer: Medicare Other | Admitting: Anesthesiology

## 2012-12-02 DIAGNOSIS — K8010 Calculus of gallbladder with chronic cholecystitis without obstruction: Secondary | ICD-10-CM

## 2012-12-02 HISTORY — PX: CHOLECYSTECTOMY: SHX55

## 2012-12-02 LAB — RENAL FUNCTION PANEL
CO2: 24 mEq/L (ref 19–32)
Chloride: 95 mEq/L — ABNORMAL LOW (ref 96–112)
GFR calc Af Amer: 8 mL/min — ABNORMAL LOW (ref >90)
Glucose, Bld: 95 mg/dL (ref 70–99)
Potassium: 4.1 mEq/L (ref 3.5–5.1)
Sodium: 137 mEq/L (ref 135–145)

## 2012-12-02 LAB — CBC
HCT: 26.9 % — ABNORMAL LOW (ref 36.0–46.0)
Hemoglobin: 8.6 g/dL — ABNORMAL LOW (ref 12.0–15.0)
WBC: 10.4 10*3/uL (ref 4.0–10.5)

## 2012-12-02 LAB — BASIC METABOLIC PANEL
CO2: 23 mEq/L (ref 19–32)
Calcium: 9.2 mg/dL (ref 8.4–10.5)
Creatinine, Ser: 6.58 mg/dL — ABNORMAL HIGH (ref 0.50–1.10)
Glucose, Bld: 185 mg/dL — ABNORMAL HIGH (ref 70–99)

## 2012-12-02 SURGERY — LAPAROSCOPIC CHOLECYSTECTOMY WITH INTRAOPERATIVE CHOLANGIOGRAM
Anesthesia: General | Site: Abdomen | Wound class: Clean Contaminated

## 2012-12-02 MED ORDER — ROCURONIUM BROMIDE 100 MG/10ML IV SOLN
INTRAVENOUS | Status: DC | PRN
  Administered 2012-12-02: 10 mg via INTRAVENOUS
  Administered 2012-12-02: 30 mg via INTRAVENOUS

## 2012-12-02 MED ORDER — LIDOCAINE HCL (CARDIAC) 20 MG/ML IV SOLN
INTRAVENOUS | Status: DC | PRN
  Administered 2012-12-02: 30 mg via INTRAVENOUS

## 2012-12-02 MED ORDER — LABETALOL HCL 5 MG/ML IV SOLN
INTRAVENOUS | Status: DC | PRN
  Administered 2012-12-02 (×4): 5 mg via INTRAVENOUS

## 2012-12-02 MED ORDER — LACTATED RINGERS IV SOLN: INTRAVENOUS | Status: DC

## 2012-12-02 MED ORDER — SODIUM CHLORIDE 0.9 % IR SOLN
Status: DC | PRN
  Administered 2012-12-02: 1

## 2012-12-02 MED ORDER — BUPIVACAINE-EPINEPHRINE 0.5% -1:200000 IJ SOLN
INTRAMUSCULAR | Status: DC | PRN
  Administered 2012-12-02: 15 mL

## 2012-12-02 MED ORDER — MEPERIDINE HCL 25 MG/ML IJ SOLN: 6.2500 mg | INTRAMUSCULAR | Status: DC | PRN

## 2012-12-02 MED ORDER — NEOSTIGMINE METHYLSULFATE 1 MG/ML IJ SOLN
INTRAMUSCULAR | Status: DC | PRN
  Administered 2012-12-02: 4 mg via INTRAVENOUS

## 2012-12-02 MED ORDER — LIDOCAINE HCL (CARDIAC) 20 MG/ML IV SOLN
INTRAVENOUS | Status: AC
  Filled 2012-12-02: qty 5

## 2012-12-02 MED ORDER — SODIUM CHLORIDE 0.9 % IV SOLN
INTRAVENOUS | Status: DC | PRN
  Administered 2012-12-02: 11:00:00 via INTRAVENOUS

## 2012-12-02 MED ORDER — 0.9 % SODIUM CHLORIDE (POUR BTL) OPTIME
TOPICAL | Status: DC | PRN
  Administered 2012-12-02: 1000 mL

## 2012-12-02 MED ORDER — OXYCODONE HCL 5 MG/5ML PO SOLN: 5.0000 mg | Freq: Once | ORAL | Status: DC | PRN

## 2012-12-02 MED ORDER — ARTIFICIAL TEARS OP OINT
TOPICAL_OINTMENT | OPHTHALMIC | Status: DC | PRN
  Administered 2012-12-02: 1 via OPHTHALMIC

## 2012-12-02 MED ORDER — ONDANSETRON HCL 4 MG/2ML IJ SOLN
INTRAMUSCULAR | Status: DC | PRN
  Administered 2012-12-02: 4 mg via INTRAVENOUS

## 2012-12-02 MED ORDER — MIDAZOLAM HCL 5 MG/5ML IJ SOLN
INTRAMUSCULAR | Status: DC | PRN
  Administered 2012-12-02: 2 mg via INTRAVENOUS

## 2012-12-02 MED ORDER — PROPOFOL 10 MG/ML IV BOLUS
INTRAVENOUS | Status: DC | PRN
  Administered 2012-12-02: 150 mg via INTRAVENOUS

## 2012-12-02 MED ORDER — HYDROMORPHONE HCL PF 1 MG/ML IJ SOLN
0.2500 mg | INTRAMUSCULAR | Status: DC | PRN
  Administered 2012-12-02: 0.5 mg via INTRAVENOUS

## 2012-12-02 MED ORDER — LIDOCAINE HCL 4 % MT SOLN
OROMUCOSAL | Status: DC | PRN
  Administered 2012-12-02: 4 mL via TOPICAL

## 2012-12-02 MED ORDER — GLYCOPYRROLATE 0.2 MG/ML IJ SOLN
INTRAMUSCULAR | Status: DC | PRN
  Administered 2012-12-02: .6 mg via INTRAVENOUS

## 2012-12-02 MED ORDER — BUPIVACAINE-EPINEPHRINE (PF) 0.5% -1:200000 IJ SOLN
INTRAMUSCULAR | Status: AC
  Filled 2012-12-02: qty 10

## 2012-12-02 MED ORDER — PROMETHAZINE HCL 25 MG/ML IJ SOLN: 6.2500 mg | INTRAMUSCULAR | Status: DC | PRN

## 2012-12-02 MED ORDER — OXYCODONE HCL 5 MG PO TABS: 5.0000 mg | ORAL_TABLET | Freq: Once | ORAL | Status: DC | PRN

## 2012-12-02 MED ORDER — SODIUM CHLORIDE 0.9 % IV SOLN
INTRAVENOUS | Status: DC | PRN
  Administered 2012-12-02: 12:00:00

## 2012-12-02 MED ORDER — FENTANYL CITRATE 0.05 MG/ML IJ SOLN
INTRAMUSCULAR | Status: DC | PRN
  Administered 2012-12-02 (×2): 75 ug via INTRAVENOUS

## 2012-12-02 MED ORDER — SODIUM CHLORIDE 0.9 % IV SOLN
INTRAVENOUS | Status: DC
  Administered 2012-12-02: 11:00:00 via INTRAVENOUS

## 2012-12-02 SURGICAL SUPPLY — 45 items
ADH SKN CLS APL DERMABOND .7 (GAUZE/BANDAGES/DRESSINGS) ×1
APPLIER CLIP ROT 10 11.4 M/L (STAPLE) ×2
APR CLP MED LRG 11.4X10 (STAPLE) ×1
BAG SPEC RTRVL LRG 6X4 10 (ENDOMECHANICALS)
BLADE SURG ROTATE 9660 (MISCELLANEOUS) IMPLANT
CANISTER SUCTION 2500CC (MISCELLANEOUS) ×2 IMPLANT
CHLORAPREP W/TINT 26ML (MISCELLANEOUS) ×2 IMPLANT
CLIP APPLIE ROT 10 11.4 M/L (STAPLE) ×1 IMPLANT
CLOTH BEACON ORANGE TIMEOUT ST (SAFETY) ×2 IMPLANT
COVER MAYO STAND STRL (DRAPES) ×2 IMPLANT
COVER SURGICAL LIGHT HANDLE (MISCELLANEOUS) ×2 IMPLANT
DECANTER SPIKE VIAL GLASS SM (MISCELLANEOUS) ×2 IMPLANT
DERMABOND ADVANCED (GAUZE/BANDAGES/DRESSINGS) ×1
DERMABOND ADVANCED .7 DNX12 (GAUZE/BANDAGES/DRESSINGS) ×1 IMPLANT
DEVICE TROCAR PUNCTURE CLOSURE (ENDOMECHANICALS) ×1 IMPLANT
DRAPE C-ARM 42X72 X-RAY (DRAPES) ×2 IMPLANT
DRAPE UTILITY 15X26 W/TAPE STR (DRAPE) ×4 IMPLANT
ELECT REM PT RETURN 9FT ADLT (ELECTROSURGICAL) ×2
ELECTRODE REM PT RTRN 9FT ADLT (ELECTROSURGICAL) ×1 IMPLANT
GLOVE BIOGEL PI IND STRL 7.0 (GLOVE) IMPLANT
GLOVE BIOGEL PI IND STRL 8 (GLOVE) ×1 IMPLANT
GLOVE BIOGEL PI INDICATOR 7.0 (GLOVE) ×1
GLOVE BIOGEL PI INDICATOR 8 (GLOVE) ×1
GLOVE SS BIOGEL STRL SZ 7.5 (GLOVE) ×1 IMPLANT
GLOVE SUPERSENSE BIOGEL SZ 7.5 (GLOVE) ×1
GLOVE SURG SS PI 7.0 STRL IVOR (GLOVE) ×1 IMPLANT
GOWN PREVENTION PLUS XLARGE (GOWN DISPOSABLE) ×2 IMPLANT
GOWN STRL NON-REIN LRG LVL3 (GOWN DISPOSABLE) ×6 IMPLANT
KIT BASIN OR (CUSTOM PROCEDURE TRAY) ×2 IMPLANT
KIT ROOM TURNOVER OR (KITS) ×2 IMPLANT
NS IRRIG 1000ML POUR BTL (IV SOLUTION) ×2 IMPLANT
PAD ARMBOARD 7.5X6 YLW CONV (MISCELLANEOUS) ×2 IMPLANT
POUCH SPECIMEN RETRIEVAL 10MM (ENDOMECHANICALS) IMPLANT
SCISSORS LAP 5X35 DISP (ENDOMECHANICALS) IMPLANT
SET CHOLANGIOGRAPH 5 50 .035 (SET/KITS/TRAYS/PACK) ×2 IMPLANT
SET IRRIG TUBING LAPAROSCOPIC (IRRIGATION / IRRIGATOR) ×2 IMPLANT
SPECIMEN JAR SMALL (MISCELLANEOUS) ×2 IMPLANT
SUT MON AB 5-0 PS2 18 (SUTURE) ×2 IMPLANT
SUT VICRYL 0 UR6 27IN ABS (SUTURE) ×1 IMPLANT
TOWEL OR 17X24 6PK STRL BLUE (TOWEL DISPOSABLE) ×2 IMPLANT
TOWEL OR 17X26 10 PK STRL BLUE (TOWEL DISPOSABLE) ×2 IMPLANT
TRAY LAPAROSCOPIC (CUSTOM PROCEDURE TRAY) ×2 IMPLANT
TROCAR XCEL BLUNT TIP 100MML (ENDOMECHANICALS) ×2 IMPLANT
TROCAR Z-THREAD FIOS 11X100 BL (TROCAR) ×2 IMPLANT
TROCAR Z-THREAD FIOS 5X100MM (TROCAR) ×4 IMPLANT

## 2012-12-02 NOTE — Progress Notes (Signed)
UR Chart Review Completed  

## 2012-12-02 NOTE — OR Nursing (Signed)
(+)   bruit/thrill in LUE

## 2012-12-02 NOTE — Anesthesia Preprocedure Evaluation (Signed)
Anesthesia Evaluation  Patient identified by MRN, date of birth, ID band Patient awake    Reviewed: Allergy & Precautions, H&P , NPO status , Patient's Chart, lab work & pertinent test results  History of Anesthesia Complications Negative for: history of anesthetic complications  Airway Mallampati: II  Neck ROM: Full    Dental  (+) Teeth Intact   Pulmonary shortness of breath,  breath sounds clear to auscultation        Cardiovascular hypertension, +CHF + Valvular Problems/Murmurs Rhythm:Regular Rate:Normal + Systolic murmurs Echo noted . Good EF   Neuro/Psych  Neuromuscular disease    GI/Hepatic GERD-  ,  Endo/Other    Renal/GU CRFRenal disease     Musculoskeletal   Abdominal   Peds  Hematology  (+) Blood dyscrasia, anemia ,   Anesthesia Other Findings   Reproductive/Obstetrics                           Anesthesia Physical Anesthesia Plan  ASA: III  Anesthesia Plan: General   Post-op Pain Management:    Induction: Intravenous  Airway Management Planned: Oral ETT  Additional Equipment:   Intra-op Plan:   Post-operative Plan: Extubation in OR  Informed Consent: I have reviewed the patients History and Physical, chart, labs and discussed the procedure including the risks, benefits and alternatives for the proposed anesthesia with the patient or authorized representative who has indicated his/her understanding and acceptance.   Dental advisory given  Plan Discussed with: CRNA and Surgeon  Anesthesia Plan Comments:         Anesthesia Quick Evaluation

## 2012-12-02 NOTE — Progress Notes (Addendum)
Subjective:  Going to surgery today. Cramped last night after HD.  Objective: Vital signs in last 24 hours: Temp:  [97 F (36.1 C)-97.9 F (36.6 C)] 97 F (36.1 C) (12/20 0422) Pulse Rate:  [69-78] 69  (12/20 0422) Resp:  [18] 18  (12/20 0422) BP: (125-175)/(66-101) 155/84 mmHg (12/20 0504) SpO2:  [92 %-100 %] 98 % (12/20 0422) Weight:  [63.3 kg (139 lb 8.8 oz)] 63.3 kg (139 lb 8.8 oz) (12/20 0422) Weight change: 0.8 kg (1 lb 12.2 oz)  Intake/Output from previous day: 12/19 0701 - 12/20 0700 In: 540 [P.O.:380; I.V.:10; IV Piggyback:150] Out: -    EXAM: General appearance:  Alert, in no apparent distress Resp:  CTA without rales, rhonchi, or wheezes Cardio:  RRR with Gr I/VI systolic murmur, no rub GI:  + BS, soft and nontender Extremities:  No edema Access:  AVF @ LUA with + bruit  Lab Results:  Basename 12/02/12 0535 12/01/12 0530  WBC 10.4 8.0  HGB 8.6* 8.2*  HCT 26.9* 26.2*  PLT 220 203   BMET:   Basename 12/02/12 0535 12/01/12 0530  NA 137 140  K 4.1 4.4  CL 95* 98  CO2 24 32  GLUCOSE 95 92  BUN 51* 26*  CREATININE 5.87* 3.65*  CALCIUM 9.7 9.2  ALBUMIN 2.6* 2.4*   No results found for this basename: PTH:2 in the last 72 hours Iron Studies: No results found for this basename: IRON,TIBC,TRANSFERRIN,FERRITIN in the last 72 hours  Dialysis Orders: Center: [facility] on tts . EDW 61.5 kg HD Bath 2.0 k,2.0 ca Time 4 Heparin 6.2 ml. Access L U AVF BFR 300 DFR 800 Zemplar 7 mcg IV/HD Epogen 10000 Units IV/HD Venofer 50 mg weekly  Assessment/Plan: 1. Recent cholecystitis- treated medically last admission, now to OR today for cholecystectomy. She does not need postop fluids, at high risk for CHF given ESRD status. Keep saline locked or KVO please. Thanks 2. ESRD - HD on TTS @ [place]; HD tomorrow to get back on schedule.   3. Pulm edema- resolved, dialyzed to lowest possible weight yest with symptoms.  4. HTN/Volume - BP 151/89, on Labetalol 600 mg bid and Doxazosin 8 mg  bid here as at home; wt 61.1 kg today s/p net UF 2.1 L (EDW 61.5 kg).  Lower EDW at discharge. 5. Anemia - Hgb down to 8.2, s/p Aranesp 100 mcg on Mon.  6. Metabolic bone disease - Ca 9.2 (10.6 corrected), P 3.1; outpatient Zemplar 7 mcg, no current binders.  7. Gout - on Allopurinol. Getting steroids now for gout vs pseudogout flare in hand 8. Hx Thrombocytopenia - Plts now 203K, resolved.  9. 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   Kelly Splinter  MD [facility] [phone] pgr    [phone] cell 12/02/2012, 10:05 AM

## 2012-12-02 NOTE — Transfer of Care (Signed)
Immediate Anesthesia Transfer of Care Note  Patient: [name]  Procedure(s) Performed: Procedure(s) (LRB) with comments: LAPAROSCOPIC CHOLECYSTECTOMY WITH INTRAOPERATIVE CHOLANGIOGRAM (N/A)  Patient Location: PACU  Anesthesia Type:General  Level of Consciousness: awake, alert  and oriented  Airway & Oxygen Therapy: Patient Spontanous Breathing and Patient connected to nasal cannula oxygen  Post-op Assessment: Report given to PACU RN, Post -op Vital signs reviewed and stable and Patient moving all extremities  Post vital signs: Reviewed and stable  Complications: No apparent anesthesia complications

## 2012-12-02 NOTE — Anesthesia Postprocedure Evaluation (Signed)
  Anesthesia Post-op Note  Patient: [name]  Procedure(s) Performed: Procedure(s) (LRB) with comments: LAPAROSCOPIC CHOLECYSTECTOMY WITH INTRAOPERATIVE CHOLANGIOGRAM (N/A)  Patient Location: PACU  Anesthesia Type:General  Level of Consciousness: awake  Airway and Oxygen Therapy: Patient Spontanous Breathing  Post-op Pain: mild  Post-op Assessment: Post-op Vital signs reviewed  Post-op Vital Signs: Reviewed  Complications: No apparent anesthesia complications

## 2012-12-02 NOTE — Progress Notes (Signed)
ANTIBIOTIC CONSULT NOTE - Follow up  Pharmacy Consult for Zosyn Indication: cholecystitis  Allergies  Allergen Reactions  . Sulfa Antibiotics     Both parents allergic-so will not take    Patient Measurements: Height: 5\' 2"  (157.5 cm) Weight: 139 lb 8.8 oz (63.3 kg) (scale B) IBW/kg (Calculated) : 50.1  Adjusted Body Weight: n/a  Vital Signs: Temp: 97.3 F (36.3 C) (12/20 1311) Temp src: Oral (12/20 0422) BP: 167/84 mmHg (12/20 1311) Pulse Rate: 70  (12/20 1300) Intake/Output from previous day: 12/19 0701 - 12/20 0700 In: 540 [P.O.:380; I.V.:10; IV Piggyback:150] Out: -  Intake/Output from this shift: Total I/O In: 200 [I.V.:200] Out: 10 [Blood:10]  Labs:  [facility] 12/02/12 0535 12/01/12 0530 11/30/12 0630  WBC 10.4 8.0 9.0  HGB 8.6* 8.2* 8.5*  PLT 220 203 192  LABCREA -- -- --  CREATININE 5.87* 3.65* 5.84*   Estimated Creatinine Clearance: 8.7 ml/min (by C-G formula based on Cr of 5.87). No results found for this basename: VANCOTROUGH:2,VANCOPEAK:2,VANCORANDOM:2,GENTTROUGH:2,GENTPEAK:2,GENTRANDOM:2,TOBRATROUGH:2,TOBRAPEAK:2,TOBRARND:2,AMIKACINPEAK:2,AMIKACINTROU:2,AMIKACIN:2, in the last 72 hours   Microbiology: Recent Results (from the past 720 hour(s))  MRSA PCR SCREENING     Status: Normal   Collection Time   11/07/12  3:59 AM      Component Value Range Status Comment   MRSA by PCR NEGATIVE  NEGATIVE Final   CULTURE, BLOOD (ROUTINE X 2)     Status: Normal   Collection Time   11/07/12  6:47 AM      Component Value Range Status Comment   Specimen Description BLOOD RIGHT ARM   Final    Special Requests BOTTLES DRAWN AEROBIC AND ANAEROBIC 10CC   Final    Culture  Setup Time 11/07/2012 16:28   Final    Culture NO GROWTH 5 DAYS   Final    Report Status 11/13/2012 FINAL   Final   CULTURE, BLOOD (ROUTINE X 2)     Status: Normal   Collection Time   11/07/12  7:05 AM      Component Value Range Status Comment   Specimen Description BLOOD RIGHT FOREARM   Final     Special Requests     Final    Value: BOTTLES DRAWN AEROBIC AND ANAEROBIC AERO 6CC ANA 5CC   Culture  Setup Time 11/07/2012 16:28   Final    Culture NO GROWTH 5 DAYS   Final    Report Status 11/13/2012 FINAL   Final   CULTURE, BLOOD (ROUTINE X 2)     Status: Normal (Preliminary result)   Collection Time   11/28/12 10:00 AM      Component Value Range Status Comment   Specimen Description BLOOD RIGHT ARM   Final    Special Requests BOTTLES DRAWN AEROBIC ONLY 4CC   Final    Culture  Setup Time 11/28/2012 14:03   Final    Culture     Final    Value:        BLOOD CULTURE RECEIVED NO GROWTH TO DATE CULTURE WILL BE HELD FOR 5 DAYS BEFORE ISSUING A FINAL NEGATIVE REPORT   Report Status PENDING   Incomplete   CULTURE, BLOOD (ROUTINE X 2)     Status: Normal (Preliminary result)   Collection Time   11/28/12 10:10 AM      Component Value Range Status Comment   Specimen Description BLOOD RIGHT ARM   Final    Special Requests BOTTLES DRAWN AEROBIC ONLY 5CC   Final    Culture  Setup Time 11/28/2012 14:03  Final    Culture     Final    Value:        BLOOD CULTURE RECEIVED NO GROWTH TO DATE CULTURE WILL BE HELD FOR 5 DAYS BEFORE ISSUING A FINAL NEGATIVE REPORT   Report Status PENDING   Incomplete   SURGICAL PCR SCREEN     Status: Normal   Collection Time   12/01/12  8:09 PM      Component Value Range Status Comment   MRSA, PCR NEGATIVE  NEGATIVE Final    Staphylococcus aureus NEGATIVE  NEGATIVE Final    Assessment: 61 yo female with ESRD who was having fevers (Tm 102.6) on admission but now afebrille; differential includes acute cholecystitis.  Cultures have not grown anything to date, cholecystectomy done today. For dialysis tomorrow.  Goal of Therapy:  Resoultion of infection  Plan:  1. Zosyn 2.25g IV q8 hrs. 2. Will f/u cultures, surgery plans.  Georgina Peer 12/02/2012,1:59 PM

## 2012-12-02 NOTE — Anesthesia Procedure Notes (Addendum)
Procedure Name: Intubation Date/Time: 12/02/2012 11:30 AM Performed by: Vaughan Browner Pre-anesthesia Checklist: Patient identified, Emergency Drugs available, Suction available and Patient being monitored Patient Re-evaluated:Patient Re-evaluated prior to inductionOxygen Delivery Method: Circle system utilized Preoxygenation: Pre-oxygenation with 100% oxygen Intubation Type: IV induction Ventilation: Mask ventilation without difficulty and Oral airway inserted - appropriate to patient size Laryngoscope Size: Mac and 3 Tube type: Oral Tube size: 7.5 mm Number of attempts: 1 Airway Equipment and Method: Stylet Placement Confirmation: ETT inserted through vocal cords under direct vision,  positive ETCO2 and breath sounds checked- equal and bilateral Secured at: 22 cm Tube secured with: Tape Dental Injury: Injury to lip  Comments: Small lac left side of upper lip noted after intubation.  Pressure held to site.  Teeth and gums as pre-op.

## 2012-12-02 NOTE — Op Note (Signed)
Preoperative diagnosis: Cholecystitis  Postoperative diagnosis: Cholecystitis  Surgical procedure: Laparoscopic cholecystectomy with intraoperative cholangiogram  Surgeon: Marland Kitchen T. Fenix Ruppe M.D.  Assistant: None  Anesthesia: General Endotracheal  Complications: None  Estimated blood loss: Minimal  Description of procedure: The patient brought to the operating room, placed in the supine position on the operating table, and general endotracheal anesthesia induced. The abdomen was widely sterilely prepped and draped. The patient had received preoperative IV antibiotics and PAS were in place. Patient timeout was performed the correct procedure verified. Standard 4 port technique was used with an open Hassan cannula at the umbilicus and the remainder of the ports placed under direct vision. The gallbladder was visualized. It appeared slightly thickened with extensive filmy adhesions consistent with previous acute colecystitis but not acutely inflamed. The fundus was grasped and elevated up over the liver and the infundibulum retracted inferiolaterally. Peritoneum anterior and posterior to close triangle was incised and fibrofatty tissue stripped off the neck of the gallbladder toward the porta hepatis. The distal gallbladder was thoroughly dissected. The cystic artery was identified in close triangle and the cystic duct gallbladder junction dissected 360.  A good critical view was obtained. When the anatomy was clear the cystic duct was clipped at the gallbladder junction and an operative cholangiogram obtained through the cystic duct. This showed good filling of a normal common bile duct and intrahepatic ducts with free flow into the duodenum and no filling defects. Following this the Cholangiocath was removed and the cystic duct was doubly clipped proximally and divided. The cystic artery was doubly clipped proximally and distally and divided. The gallbladder was dissected free from its bed using  hook cautery and removed through the umbilical port site. Complete hemostasis was obtained in the gallbladder bed. The right upper quadrant was thoroughly irrigated and hemostasis assured. Trochars were removed and all CO2 evacuated and the [facility] trocar site fascial defect closed. Skin incisions were closed with subcuticular Monocryl and Dermabond. Sponge needle and instrument counts were correct. The patient was taken to PACU in good condition.  Amberle Lyter T  12/02/2012

## 2012-12-02 NOTE — Progress Notes (Signed)
PT Cancellation Note  Patient Details Name: [name] MRN: [id number] DOB: 13-Feb-1951 Cancelled Treatment:    Reason Eval/Treat Not Completed: Observed pt ambulating in the hallway with nursing without difficulty. Noted she will have her procedure today at 11 am. Will hold PT today and f/u tomorrow after surgery to determine need for any f/u therapy needs.    [facility] HELEN 12/02/2012, 9:26 AM Pager: [phone]

## 2012-12-02 NOTE — Progress Notes (Signed)
Pt returned to room from OR per stretcher. Placed on Telemetry monitor. Family in room with pt.Placed on 02 at 2 l n/c VS taken and charted .

## 2012-12-02 NOTE — Progress Notes (Signed)
[facility] PROGRESS NOTE  [name] [id number] DOB: [birth year] DOA: 11/28/2012 PCP: Estanislado Emms, MD  Assessment/Plan:  #1 volume overload/pulmonary edema Likely secondary to end-stage renal disease. Clinical improvement post hemodialysis. Patient does have a history of left cavaderic renal transplant in 2001. Patient is currently being weaned off CellCept and prednisone over the past year. Patient started dialysis at November 2012. Continue hemodialysis. Per renal.  #2 end-stage renal disease on hemodialysis on Tuesday and Thursdays at [facility].  Potassium of 4.1 today. Patient's last hemodialysis was 12/18. Patient for hemodialysis tomorrow. Per renal  #3 hypertension Stable. Continue labetalol, CARDURA. Follow.  #4 fever Unkown  etiology. Patient is currently afebrile. May be secondary to gout versus pseudogout versus acute cholecystitis. Blood cultures are pending. Patient was evaluated by general surgery during her last hospitalization but due to thrombocytopenia cholecystectomy was delayed. Patient's thrombocytopenia has since resolved. Patient is currently afebrile. Patient for cholecystectomy today per general surgery. Continue IV Zosyn.  #5 gout versus pseudogout Clinical improvement. Continue short steroid burst.  #6 anemia Likely secondary to anemia of chronic disease. Hemoglobin is 8.6 today in stable. Continue current meds. Per renal.  #7 history of failed renal transplant Patient restarted hemodialysis 11/ 2012. Currently on a short steroid burst. Per renal  #8 hyperlipidemia Zocor.  #9 questionable subacute versus acute cholecystitis. During last hospitalization due to thrombocytopenia cholecystectomy was delayed. Thrombocytopenia has since resolved. Patient is being followed by general surgery for  cholecystectomy today. Continue empiric IV Zosyn. Per general surgery.  Code Status: Full Family Communication: Updated patient and mother at  bedside. Disposition Plan: Home when medically stable   Consultants:  Nephrology: Dr. Jonnie Finner 11/28/2012  General surgery Dr. Excell Seltzer 11/29/2012  Procedures:  CXR 11/29/12, 11/28/12  XRAY 11/28/12  Antibiotics:  Zosyn IV 11/30/12  HPI/Subjective:  Patient states SOB improved from admission. Patient still with some right upper quadrant pain. Patient complaining of cramps last night. Patient nervous about procedure today.  Objective: Filed Vitals:   12/01/12 2150 12/01/12 2338 12/02/12 0422 12/02/12 0504  BP: 162/93 160/90 175/99 155/84  Pulse: 75  69   Temp:   97 F (36.1 C)   TempSrc:   Oral   Resp:   18   Height:      Weight:   63.3 kg (139 lb 8.8 oz)   SpO2:   98%     Intake/Output Summary (Last 24 hours) at 12/02/12 1021 Last data filed at 12/02/12 0803  Gross per 24 hour  Intake    540 ml  Output      0 ml  Net    540 ml   Filed Weights   11/30/12 1703 12/01/12 0425 12/02/12 0422  Weight: 60.1 kg (132 lb 7.9 oz) 61.1 kg (134 lb 11.2 oz) 63.3 kg (139 lb 8.8 oz)    Exam:   General:  NAD  Cardiovascular: RRR no m/r/g. No LE edema.  Respiratory: CTAB anterior lung fields  Abdomen: Soft/ND/+BS/ Some TTP in RUQ/  Data Reviewed: Basic Metabolic Panel:  Lab 0000000 0535 12/01/12 0530 11/30/12 0630 11/29/12 0505 11/28/12 1844 11/28/12 1000  NA 137 140 138 142 -- 138  K 4.1 4.4 3.9 3.8 -- 4.5  CL 95* 98 96 101 -- 96  CO2 24 32 27 29 -- 23  GLUCOSE 95 92 100* 187* -- 105*  BUN 51* 26* 42* 13 -- 35*  CREATININE 5.87* 3.65* 5.84* 3.35* 7.88* --  CALCIUM 9.7 9.2 9.6 8.6 -- 9.3  MG -- -- -- -- -- --  PHOS 3.0 3.1 5.1* -- -- --   Liver Function Tests:  Lab 12/02/12 0535 12/01/12 0530 11/30/12 0630 11/28/12 1000  AST -- -- -- 15  ALT -- -- -- 7  ALKPHOS -- -- -- 81  BILITOT -- -- -- 0.4  PROT -- -- -- 7.2  ALBUMIN 2.6* 2.4* 2.5* 2.6*   No results found for this basename: LIPASE:5,AMYLASE:5 in the last 168 hours No results found for this  basename: AMMONIA:5 in the last 168 hours CBC:  Lab 12/02/12 0535 12/01/12 0530 11/30/12 0630 11/29/12 0505 11/28/12 1844 11/28/12 1000  WBC 10.4 8.0 9.0 10.7* 9.6 --  NEUTROABS -- -- 5.8 -- -- 7.5  HGB 8.6* 8.2* 8.5* 8.9* 8.6* --  HCT 26.9* 26.2* 26.5* 28.5* 27.5* --  MCV 96.1 98.1 96.7 98.6 98.2 --  PLT 220 203 192 196 177 --   Cardiac Enzymes:  Lab 11/28/12 1237  CKTOTAL --  CKMB --  CKMBINDEX --  TROPONINI <0.30   BNP (last 3 results)  Basename 11/28/12 1237  PROBNP 35557.0*   CBG: No results found for this basename: GLUCAP:5 in the last 168 hours  Recent Results (from the past 240 hour(s))  CULTURE, BLOOD (ROUTINE X 2)     Status: Normal (Preliminary result)   Collection Time   11/28/12 10:00 AM      Component Value Range Status Comment   Specimen Description BLOOD RIGHT ARM   Final    Special Requests BOTTLES DRAWN AEROBIC ONLY 4CC   Final    Culture  Setup Time 11/28/2012 14:03   Final    Culture     Final    Value:        BLOOD CULTURE RECEIVED NO GROWTH TO DATE CULTURE WILL BE HELD FOR 5 DAYS BEFORE ISSUING A FINAL NEGATIVE REPORT   Report Status PENDING   Incomplete   CULTURE, BLOOD (ROUTINE X 2)     Status: Normal (Preliminary result)   Collection Time   11/28/12 10:10 AM      Component Value Range Status Comment   Specimen Description BLOOD RIGHT ARM   Final    Special Requests BOTTLES DRAWN AEROBIC ONLY 5CC   Final    Culture  Setup Time 11/28/2012 14:03   Final    Culture     Final    Value:        BLOOD CULTURE RECEIVED NO GROWTH TO DATE CULTURE WILL BE HELD FOR 5 DAYS BEFORE ISSUING A FINAL NEGATIVE REPORT   Report Status PENDING   Incomplete   SURGICAL PCR SCREEN     Status: Normal   Collection Time   12/01/12  8:09 PM      Component Value Range Status Comment   MRSA, PCR NEGATIVE  NEGATIVE Final    Staphylococcus aureus NEGATIVE  NEGATIVE Final      Studies: No results found.  Scheduled Meds:    . [MAR HOLD] darbepoetin (ARANESP)  injection - DIALYSIS  100 mcg Intravenous Q Tue-HD  . [facility] HOLD] doxazosin  8 mg Oral BID  . [MAR HOLD] enoxaparin (LOVENOX) injection  30 mg Subcutaneous Q24H  . [facility] HOLD] labetalol  600 mg Oral BID  . [MAR HOLD] multivitamin  1 tablet Oral QHS  . [MAR HOLD] piperacillin-tazobactam (ZOSYN)  IV  2.25 g Intravenous Q8H  . [MAR HOLD] predniSONE  40 mg Oral QAC breakfast  . [MAR HOLD] simvastatin  10 mg Oral q1800  . [MAR HOLD] sodium chloride  3 mL  Intravenous Q12H   Continuous Infusions:   Principal Problem:  *Pulmonary edema Active Problems:  Dyspnea  Cholecystitis, acute  ESRD (end stage renal disease)  HTN (hypertension)  GERD (gastroesophageal reflux disease)  Anemia  Thrombocytopenia  URI (upper respiratory infection)  Fever  Gout flare  Volume overload  Pseudogout    Time spent: > 35 mins    [place] Pager [phone]. If 8PM-8AM, please contact night-coverage at [email or web address], password [facility] 12/02/2012, 10:21 AM  LOS: 4 days

## 2012-12-03 LAB — RENAL FUNCTION PANEL
BUN: 67 mg/dL — ABNORMAL HIGH (ref 6–23)
Calcium: 8.9 mg/dL (ref 8.4–10.5)
Creatinine, Ser: 7.85 mg/dL — ABNORMAL HIGH (ref 0.50–1.10)
Glucose, Bld: 143 mg/dL — ABNORMAL HIGH (ref 70–99)
Phosphorus: 5.4 mg/dL — ABNORMAL HIGH (ref 2.3–4.6)

## 2012-12-03 LAB — CBC
HCT: 25.1 % — ABNORMAL LOW (ref 36.0–46.0)
MCHC: 32.3 g/dL (ref 30.0–36.0)
MCV: 96.2 fL (ref 78.0–100.0)
RDW: 18.2 % — ABNORMAL HIGH (ref 11.5–15.5)

## 2012-12-03 MED ORDER — CALCIUM CARBONATE ANTACID 500 MG PO CHEW
200.0000 mg | CHEWABLE_TABLET | Freq: Three times a day (TID) | ORAL | Status: DC
  Administered 2012-12-03 – 2012-12-04 (×3): 200 mg via ORAL
  Filled 2012-12-03 (×5): qty 1

## 2012-12-03 MED ORDER — HYDROCODONE-ACETAMINOPHEN 5-325 MG PO TABS
ORAL_TABLET | ORAL | Status: AC
  Administered 2012-12-03: 1 via ORAL
  Filled 2012-12-03: qty 1

## 2012-12-03 MED ORDER — HYDROCODONE-ACETAMINOPHEN 5-325 MG PO TABS
ORAL_TABLET | ORAL | Status: AC
  Administered 2012-12-03: 2 via ORAL
  Filled 2012-12-03: qty 2

## 2012-12-03 MED ORDER — PARICALCITOL 5 MCG/ML IV SOLN: 7.0000 ug | INTRAVENOUS | Status: DC

## 2012-12-03 MED ORDER — HYDROCODONE-ACETAMINOPHEN 5-325 MG PO TABS
2.0000 | ORAL_TABLET | Freq: Once | ORAL | Status: AC
  Administered 2012-12-03: 2 via ORAL

## 2012-12-03 MED ORDER — HYDROCODONE-ACETAMINOPHEN 5-325 MG PO TABS
2.0000 | ORAL_TABLET | Freq: Four times a day (QID) | ORAL | Status: DC | PRN
  Administered 2012-12-04: 1 via ORAL
  Filled 2012-12-03: qty 1

## 2012-12-03 NOTE — Progress Notes (Signed)
Pt's bp 144/75 at 1449 down from 159/85.in HD prior to bringing pt back to floor.  Pt not able to receive am bp meds.  Dr. Al Corpus made aware no new orders.  BP 148/82 at 1836.  Will continue to monitor.  Pt to have BP meds tonight.   Karie Kirks, Therapist, sports.

## 2012-12-03 NOTE — Progress Notes (Signed)
PROGRESS NOTE  [name] [id number] DOB: 03-12-1951 DOA: 11/28/2012 PCP: Estanislado Emms, MD  Brief narrative: 61 year old African American female admitted with shortness of breath with BNP of 35,557 and generalized body aches. She was admitted with volume overload/pulmonary edema and was diuresis. She states that she started having pain in her lower extremities about a week ago and then developed right elbow pain. She developed such severe pain yesterday and clawing of her right hand that she came to the emergency room with fever and was thought that she had a pneumonia. Chest x-ray at the time was equivocal and showed per report by radiology mid space disease on the right side. Clinically she presents much more volume overload given her CHF history as well as end-stage renal disease and and packs were discontinued 11/29/2012. General surgery was consulted 11/29/2012 given her leukocytosis/fever and recent history of acalculous cholecystitis that did not get drained on last admission, given the fact that she had moderate thrombocytopenia.   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 Percival Spanish for  Sob/Cardiomegally-Stress test was done   Admitted for L kidney transplant Pyelo 03/04/08-E.coli-noted MRSA Buttocks at that time  Admission 12/30/07 for Persisting L Lingular Pna  Admission 08/20/05 c Cervical Spondylosis C3-4  Admission for fever-Pna vs Viral  Admission 02/20/01 c Fever  Admission 11/17/00 with Severe spinal stenosis c L>Rlateral recess stenosis   Consultants:  Nephrology: Dr. Jonnie Finner 11/28/2012  General surgery Dr. Excell Seltzer 11/29/2012  Procedures:  CXR 11/29/12, 11/28/12  XRAY hand 11/28/12  Antibiotics:  Zosyn IV  11/30/12 Blood cult 12/16 x 2=NG so far   Subjective  Doing well. Denies chest pain denies shortness breath. States that Vicodin one tablet did not help her but the pain after ambulation. States that she needed to He states that her pain is under better control now. Passing stool No blurred or double vision. She is having a cough with phlegm. Greenish sputum but no fever no chills Was dialyzed 2.4 L    Objective    Interim History: No documented fever 103.0.  Telemetry: Normal sinus rhythm no red alarms  Objective: Filed Vitals:   12/03/12 1230 12/03/12 1253 12/03/12 1326 12/03/12 1449  BP: 198/102 195/99 159/85 144/75  Pulse: 61 68 70 70  Temp:  98.1 F (36.7 C)  97.8 F (36.6 C)  TempSrc:  Oral    Resp: 14 10  16   Height:      Weight:  60.2 kg (132 lb 11.5 oz)    SpO2: 100% 100%  97%    Intake/Output Summary (Last 24 hours) at 12/03/12 1622 Last data filed at 12/03/12 1253  Gross per 24 hour  Intake    710 ml  Output   2400 ml  Net  -1690 ml    Exam:  General: Alert pleasant African American female about stated age. Mild pallor Cardiovascular: S1-S2 no murmur rub or gallop Respiratory: Clinically clear Abdomen: Slightly tender right lower quadrant however no tenderness in right upper quadrant. Bowel sounds seem intact. Skin diffuse joint swellings upper extremity Neuro grossly intact  Data Reviewed: Basic Metabolic Panel:  Lab [id number] 0443 12/02/12 1541 12/02/12 0535 12/01/12 0530 11/30/12 0630  NA 134* 133* 137 140 138  K 5.7* 5.2* -- -- --  CL 93* 94* 95* 98 96  CO2 24 23 24  32 27  GLUCOSE 143* 185* 95 92 100*  BUN 67* 57* 51* 26* 42*  CREATININE 7.85* 6.58* 5.87* 3.65* 5.84*  CALCIUM 8.9 9.2 9.7 9.2 9.6  MG -- -- -- -- --  PHOS 5.4* -- 3.0 3.1 5.1*   Liver Function Tests:  Lab 12/03/12 0443 12/02/12 0535 12/01/12 0530 11/30/12 0630 11/28/12 1000  AST -- -- -- -- 15  ALT -- -- -- -- 7  ALKPHOS -- -- -- -- 81  BILITOT -- -- -- -- 0.4   PROT -- -- -- -- 7.2  ALBUMIN 2.5* 2.6* 2.4* 2.5* 2.6*   No results found for this basename: LIPASE:5,AMYLASE:5 in the last 168 hours No results found for this basename: AMMONIA:5 in the last 168 hours CBC:  Lab 12/03/12 0500 12/02/12 0535 12/01/12 0530 11/30/12 0630 11/29/12 0505 11/28/12 1000  WBC 8.4 10.4 8.0 9.0 10.7* --  NEUTROABS -- -- -- 5.8 -- 7.5  HGB 8.1* 8.6* 8.2* 8.5* 8.9* --  HCT 25.1* 26.9* 26.2* 26.5* 28.5* --  MCV 96.2 96.1 98.1 96.7 98.6 --  PLT 221 220 203 192 196 --   Cardiac Enzymes:  Lab 11/28/12 1237  CKTOTAL --  CKMB --  CKMBINDEX --  TROPONINI <0.30   BNP: No components found with this basename: POCBNP:5 CBG: No results found for this basename: GLUCAP:5 in the last 168 hours  Recent Results (from the past 240 hour(s))  CULTURE, BLOOD (ROUTINE X 2)     Status: Normal (Preliminary result)   Collection Time   11/28/12 10:00 AM      Component Value Range Status Comment   Specimen Description BLOOD RIGHT ARM   Final    Special Requests BOTTLES DRAWN AEROBIC ONLY 4CC   Final    Culture  Setup Time 11/28/2012 14:03   Final    Culture     Final    Value:        BLOOD CULTURE RECEIVED NO GROWTH TO DATE CULTURE WILL BE HELD FOR 5 DAYS BEFORE ISSUING A FINAL NEGATIVE REPORT   Report Status PENDING   Incomplete   CULTURE, BLOOD (ROUTINE X 2)     Status: Normal (Preliminary result)   Collection Time   11/28/12 10:10 AM      Component Value Range Status Comment   Specimen Description BLOOD RIGHT ARM   Final    Special Requests BOTTLES DRAWN AEROBIC ONLY 5CC   Final    Culture  Setup Time 11/28/2012 14:03   Final    Culture     Final    Value:        BLOOD CULTURE RECEIVED NO GROWTH TO DATE CULTURE WILL BE HELD FOR 5 DAYS BEFORE ISSUING A FINAL NEGATIVE REPORT   Report Status PENDING   Incomplete   SURGICAL PCR SCREEN     Status: Normal   Collection Time   12/01/12  8:09 PM      Component Value Range Status Comment   MRSA, PCR NEGATIVE  NEGATIVE Final     Staphylococcus aureus NEGATIVE  NEGATIVE Final      Studies:              All Imaging reviewed and is as per above notation   Scheduled Meds:    . calcium carbonate  200 mg of elemental calcium Oral TID WC  . darbepoetin (ARANESP) injection - DIALYSIS  100 mcg Intravenous Q Tue-HD  . doxazosin  8 mg Oral BID  .  enoxaparin (LOVENOX) injection  30 mg Subcutaneous Q24H  . labetalol  600 mg Oral BID  . multivitamin  1 tablet Oral QHS  . paricalcitol  7 mcg Intravenous 3 times weekly  . piperacillin-tazobactam (ZOSYN)  IV  2.25 g Intravenous Q8H  . simvastatin  10 mg Oral q1800  . sodium chloride  3 mL Intravenous Q12H   Continuous Infusions:    . sodium chloride 20 mL/hr at 12/02/12 1048     Assessment/Plan: 1. Volume overload secondary to end-stage renal disease-see below 2. 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 Appreciate input. 3. Fever-unclear etiology-Likely Pseudogout. It appears that her hand x-rays showed? Pseudogout. her uric acid level done was 6.4. Would discontinue allopurinol 150 each bedtime-was on IV steroids. Transitioned to by mouth prednisone 40 mg daily-2 DC 12/23 4. Left ventricular hypertrophy with EF 65-70%-outpatient followup with cardiologist 5. Status post cholecystectomy 12/02/2012-continue Vicodin 5/325 2 tablets q6 prn-doubt that she needs further antibiotics. Will discontinue Zosyn today [received 3 days] 6. Metabolic bone disease-continue Zemplar 7 mcg 3 times a week, calcium carbonate 200 3 times a day 7. Anemia of renal disease-continue darbepoetin 100 mg q week-hemoglobin 8 range 8. Constipation-continue sorbitol 70% daily when necessary 9. Hypertension-continue doxazosin 8 mg twice a day, labetalol 600 twice a day-blood pressures slightly elevated. Monitor trends and reassess in a.m. and add medications if pulse can  tolerate 10. Hyperlipidemia-will continue Zocor 10 mg daily.   Code Status: Full Family Communication: Updated mother in room Disposition Plan: Inpatient   Verneita Griffes, MD  [facility] Pager [phone] 12/03/2012, 4:22 PM    LOS: 5 days

## 2012-12-03 NOTE — Progress Notes (Signed)
[facility] Progress Note  Subjective:   Eating solids, had BM. Happy to have GB out RUQ sore  Objective Filed Vitals:   12/02/12 2025 12/03/12 0138 12/03/12 0237 12/03/12 0500  BP: 173/85 192/92 169/85 173/91  Pulse: 57 59 57 63  Temp: 97.1 F (36.2 C)   97.7 F (36.5 C)  TempSrc: Oral   Oral  Resp: 16 16  16   Height:      Weight:    64.2 kg (141 lb 8.6 oz)  SpO2: 98% 97%  100%   Physical Exam on HD General: NAD, dialysis just being initiated Heart: RRR Lungs:no wheezes or rales Abdomen: soft + BS Extremities:SCDs no LE edema Dialysis Access: left upper AVF patent  Dialysis Orders: [facility] .  EDW 61.5 kg HD Bath 2.0 k,2.0 ca Time 4 Heparin 6.2 ml. Access L U AVF BFR 300 DFR 800  Zemplar 7 mcg IV/HD Epogen 10000 Units IV/HD Venofer 50 mg weekly  Assessment/Plan: 1. S/p lap cholecystectomy with neg intraop cholangiaram POD #1 - no heparin HD; on zosyn 2. ESRD -TTS per routine K ^ 5.7 change to 2 K bath for full 4 hours; Qb 300 = usual BFR - outpt kinetics and recent AF ~1000; not clear why K high.   3. Anemia - Hgb 8.1 post op - stable - on 100 aranesp no IV Fe ordered for here - ferritin last admission 29,496 -check Fe and tsat; repeat ferritin; MCV 96.- on venofer 50/week prior to admission. 4. Secondary hyperparathyroidism - change to 2 Ca bath = outpt bath; resume zemplar 7 TIW; P 5.4; she takes tums (no sensipar) - she is not clear on strength of dose, but takes 1 ac - "the thin ones" - will start tums 500 (though oupt records suggest she at least at one point was Rx tums ultra 5. HTN/volume - BID labetalol 600 and cardura 8 and decrease volume; 2.7 above outpt EDW 6. Nutrition - low Alb - high protein renal diet - on supplemental high protein snack at outpt HD 7. Hx failed renal tx - on higher steroid dose now due to gout vs pseudogout flare in hand - normal dose is 5 mg per day 8. Hx thrombocytopenia Noveber 2013 - resolved  Myriam Jacobson,  PA-C [place] [phone] 12/03/2012,8:15 AM  LOS: 5 days   Patient seen and examined and agree with assessment and plan as above.  Kelly Splinter  MD [facility] [phone] pgr    [phone] cell 12/03/2012, 11:21 AM    Additional Objective Labs: Basic Metabolic Panel:  Lab [id number] 0443 12/02/12 1541 12/02/12 0535 12/01/12 0530  NA 134* 133* 137 --  K 5.7* 5.2* 4.1 --  CL 93* 94* 95* --  CO2 24 23 24  --  GLUCOSE 143* 185* 95 --  BUN 67* 57* 51* --  CREATININE 7.85* 6.58* 5.87* --  CALCIUM 8.9 9.2 9.7 --  ALB -- -- -- --  PHOS 5.4* -- 3.0 3.1   Liver Function Tests:  Lab 12/03/12 0443 12/02/12 0535 12/01/12 0530 11/28/12 1000  AST -- -- -- 15  ALT -- -- -- 7  ALKPHOS -- -- -- 81  BILITOT -- -- -- 0.4  PROT -- -- -- 7.2  ALBUMIN 2.5* 2.6* 2.4* --   CBC:  Lab 12/03/12 0500 12/02/12 0535 12/01/12 0530 11/30/12 0630 11/29/12 0505 11/28/12 1000  WBC 8.4 10.4 8.0 -- -- --  NEUTROABS -- -- -- 5.8 --  7.5  HGB 8.1* 8.6* 8.2* -- -- --  HCT 25.1* 26.9* 26.2* -- -- --  MCV 96.2 96.1 98.1 96.7 98.6 --  PLT 221 220 203 -- -- --   Blood Culture    Component Value Date/Time   SDES BLOOD RIGHT ARM 11/28/2012 1010   SPECREQUEST BOTTLES DRAWN AEROBIC ONLY 5CC 11/28/2012 1010   CULT        BLOOD CULTURE RECEIVED NO GROWTH TO DATE CULTURE WILL BE HELD FOR 5 DAYS BEFORE ISSUING A FINAL NEGATIVE REPORT 11/28/2012 1010   REPTSTATUS PENDING 11/28/2012 1010    Cardiac Enzymes:  Lab 11/28/12 1237  CKTOTAL --  CKMB --  CKMBINDEX --  TROPONINI <0.30   CStudies/Results: Dg Cholangiogram Operative  12/02/2012  *RADIOLOGY REPORT*  Clinical Data:   Cholecystitis  INTRAOPERATIVE CHOLANGIOGRAM  Technique:  Cholangiographic images from the C-arm fluoroscopic device were submitted for interpretation post-operatively.  Please see the procedural report for the amount of contrast and the fluoroscopy time utilized.  Comparison:  None  Findings:  No  persistent filling defects in the common duct. Intrahepatic ducts are incompletely visualized, appearing decompressed centrally. Contrast passes into the duodenum.  IMPRESSION  Negative for retained common duct stone.   Original Report Authenticated By: D. Wallace Going, MD    Medications:    . sodium chloride 20 mL/hr at 12/02/12 1048      . darbepoetin (ARANESP) injection - DIALYSIS  100 mcg Intravenous Q Tue-HD  . doxazosin  8 mg Oral BID  . enoxaparin (LOVENOX) injection  30 mg Subcutaneous Q24H  . labetalol  600 mg Oral BID  . multivitamin  1 tablet Oral QHS  . piperacillin-tazobactam (ZOSYN)  IV  2.25 g Intravenous Q8H  . predniSONE  40 mg Oral QAC breakfast  . simvastatin  10 mg Oral q1800  . sodium chloride  3 mL Intravenous Q12H

## 2012-12-03 NOTE — Progress Notes (Signed)
Physical Therapy Treatment Patient Details Name: [name] MRN: [id number] DOB: Jun 08, 1951 Today's Date: 12/03/2012 Time: [id number] PT Time Calculation (min): 13 min  PT Assessment / Plan / Recommendation Comments on Treatment Session  Patient continues to improve with gait.  Continues with slight instability.    Follow Up Recommendations  No PT follow up;Supervision - Intermittent     Does the patient have the potential to tolerate intense rehabilitation     Barriers to Discharge        Equipment Recommendations  None recommended by PT    Recommendations for Other Services    Frequency Min 3X/week   Plan Discharge plan remains appropriate;Frequency remains appropriate    Precautions / Restrictions Precautions Precautions: Fall Restrictions Weight Bearing Restrictions: No   Pertinent Vitals/Pain     Mobility  Bed Mobility Bed Mobility: Not assessed Transfers Transfers: Sit to Stand;Stand to Sit Sit to Stand: 6: Modified independent (Device/Increase time);With upper extremity assist;With armrests;From chair/3-in-1 Stand to Sit: 6: Modified independent (Device/Increase time);With upper extremity assist;With armrests;To chair/3-in-1 Details for Transfer Assistance: No cues or assist needed Ambulation/Gait Ambulation/Gait Assistance: 4: Min guard Ambulation Distance (Feet): 400 Feet Assistive device: None Ambulation/Gait Assistance Details: Slight instability with gait - no loss of balance. Gait Pattern: Step-through pattern;Narrow base of support Gait velocity: decreased      PT Goals Acute Rehab PT Goals PT Goal: Sit to Stand - Progress: Progressing toward goal PT Goal: Stand to Sit - Progress: Progressing toward goal PT Transfer Goal: Bed to Chair/Chair to Bed - Progress: Progressing toward goal PT Goal: Ambulate - Progress: Progressing toward goal  Visit Information  Last PT Received On: 12/03/12 Assistance Needed: +1    Subjective Data  Subjective: "I think I'm going home tomorrow.  I walked with my mother in the hallway earlier."   Cognition  Overall Cognitive Status: Appears within functional limits for tasks assessed/performed Arousal/Alertness: Awake/alert Orientation Level: Appears intact for tasks assessed Behavior During Session: [facility] for tasks performed    Balance  Balance Balance Assessed: Yes High Level Balance High Level Balance Activites: Turns;Head turns High Level Balance Comments: No loss of balance with high level activities  End of Session PT - End of Session Equipment Utilized During Treatment: Gait belt Activity Tolerance: Patient tolerated treatment well;Patient limited by fatigue Patient left: in chair;with call bell/phone within reach;with family/visitor present Nurse Communication: Mobility status   GP     Despina Pole 12/03/2012, 5:08 PM Carita Pian. Sanjuana Kava, [place] Pager [phone]

## 2012-12-04 LAB — CULTURE, BLOOD (ROUTINE X 2): Culture: NO GROWTH

## 2012-12-04 MED ORDER — SORBITOL 70 % SOLN: 30.0000 mL | Freq: Every day | Status: DC | PRN

## 2012-12-04 MED ORDER — LISINOPRIL 10 MG PO TABS: ORAL_TABLET | ORAL | Status: DC

## 2012-12-04 NOTE — Progress Notes (Signed)
Cardiac Monitors were down from 0800-1000. Patient was in NSR before and when the monitors rebooted, patient remain in NSR. No complaints and no signs or symptoms of distress or discomfort during this period.

## 2012-12-04 NOTE — Progress Notes (Signed)
DC IV, DC Tele, DC Home. Discharge instructions and home medications discussed with patient and patient family member. Patient and family denied any questions or concerns at this time. Patient leaving unit via wheelchair and appears in no acute distress.

## 2012-12-04 NOTE — Discharge Summary (Addendum)
. Physician Discharge Summary  [name] [id number] DOB: [birth year] DOA: 11/28/2012  PCP: Estanislado Emms, MD  Admit date: 11/28/2012 Discharge date: 12/04/2012  Time spent: 35 minutes  Recommendations for Outpatient Follow-up:  1. Recommend outpatient followup for blood pressure-lisinopril 10 mg on non-dialysis days was added to patient's medications 2. Recommend outpatient followup with general surgery Dr. Epimenio Sarin to call for appointment in 2-3 weeks  Discharge Diagnoses:  Principal Problem:  *Pulmonary edema Active Problems:  Dyspnea  Cholecystitis, acute  ESRD (end stage renal disease)  HTN (hypertension)  GERD (gastroesophageal reflux disease)  Anemia  Thrombocytopenia  URI (upper respiratory infection)  Fever  Gout flare  Volume overload  Pseudogout   Discharge Condition: good  Diet recommendation: low salt, Renal  Filed Weights   12/03/12 0810 12/03/12 1253 12/04/12 0543  Weight: 59.7 kg (131 lb 9.8 oz) 60.2 kg (132 lb 11.5 oz) 61.2 kg (134 lb 14.7 oz)    History of present illness:  61 year old African American female admitted with shortness of breath with BNP of 35,557 and generalized body aches. She was admitted with volume overload/pulmonary edema and was diuresis.  She states that she started having pain in her lower extremities about a week ago and then developed right elbow pain. She developed such severe pain yesterday and clawing of her right hand that she came to the emergency room with fever and was thought that she had a pneumonia.  Chest x-ray at the time was equivocal and showed per report by radiology mid space disease on the right side.  Clinically she presents much more volume overload given her CHF history as well as end-stage renal disease and and packs were discontinued 11/29/2012.  General surgery was consulted 11/29/2012 given her leukocytosis/fever and recent history of acalculous cholecystitis that did not get drained on  last admission, given the fact that she had moderate thrombocytopenia. Ultimately she underwent laparoscopic cholecystectomy 12/02/2012 without event.   Hospital Course:  1. Volume overload secondary to end-stage renal disease-see below 2. S/p Lap Cholecystectomy 12.20-patient doing much better. She had some occasional pains around the periumbilical region. She passed 2 stools and I discussed her case with Dr. Hunt Oris who recommended outpatient followup soon. She is aware of lifting restrictions of less than 10 pounds and aware that she should not scrub her belly and take baths until seen. 3. Uncontrolled hypertension-patient was reimplemented on discharge on her Cardura 8 mg, her labetalol 600 twice a day. I added an ACE inhibitor lisinopril 10 mg on her nondialysis days-nephrologist is to comment and further workup as an outpatient 4. 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 Appreciate input. I have tapered her off her steroids completely. She is to followup with Dr. Shaune Pascal. Florene Glen for discussion of needs for other medications 5. Fever-unclear etiology-Likely Pseudogout. It appears that her hand x-rays showed changes consistent with? Pseudogout. her uric acid level done was 6.4. Would discontinue allopurinol 150 each bedtime-was on IV steroids. Transitioned to by mouth prednisone 40 mg daily-2 DC 12/23-see above 6. Left ventricular hypertrophy with EF 65-70%-outpatient followup with cardiologist 7. Status post cholecystectomy 12/02/2012-continue Vicodin 5/325 2 tablets q6 prn-doubt that she needs further antibiotics. Will discontinue Zosyn today [received 3 days] 8. Metabolic bone disease-continue Zemplar 7 mcg 3 times a week, calcium carbonate 200 3 times a day 9. Anemia of renal disease-continue darbepoetin 100 mg q week-hemoglobin 8  range 10. Constipation-continue sorbitol  70% daily when necessary 11. Hypertension-continue doxazosin 8 mg twice a day, labetalol 600 twice a day-blood pressures slightly elevated. Monitor trends and reassess in a.m. and add medications if pulse can tolerate 12. Hyperlipidemia-will continue Zocor 10 mg daily.   Chart reviewed as below-  Admission 11/06/2012 with Acalculous cholecystitis-Surgery deferred 2/2 to low PLt count  H/o ESRD 2/2 to Mesangial prolif Gsclerosis-dialysis 1st in 1993 6-h/o Renal; Cadaveric renal transplant 2001  Seen by Dr. Percival Spanish for Sob/Cardiomegally-Stress test was done  Admitted for L kidney transplant Pyelo 03/04/08-E.coli-noted MRSA Buttocks at that time  Admission 12/30/07 for Persisting L Lingular Pna  Admission 08/20/05 c Cervical Spondylosis C3-4  Admission for fever-Pna vs Viral  Admission 02/20/01 c Fever  Admission 11/17/00 with Severe spinal stenosis c L>Rlateral recess stenosis  Consultants:  Nephrology: Dr. Jonnie Finner 11/28/2012  General surgery Dr. Excell Seltzer 11/29/2012 Procedures:  CXR 11/29/12, 11/28/12  XRAY hand 11/28/12 Antibiotics:  Zosyn IV 11/30/12  Blood cult 12/16 x 2=NG so far  Discharge Exam: Filed Vitals:   12/03/12 1836 12/03/12 2016 12/04/12 0543 12/04/12 0904  BP: 148/82 167/83 157/81 164/79  Pulse:  71 72 66  Temp:  98.3 F (36.8 C) 97.9 F (36.6 C)   TempSrc:  Oral Oral   Resp:  18 18   Height:      Weight:   61.2 kg (134 lb 14.7 oz)   SpO2:  100% 100%    Alert pleasant oriented I'll abdominal pain. States she has enough pain medications to last her as she takes Vicodin for an spinal stenosis. Tolerating diet. Passing flatus and stool   General: Alert pleasant Cardiovascular: S1-S2 no murmur rub or Respiratory: Clinically clear  Discharge Instructions     Medication List     As of 12/04/2012  9:44 AM    STOP taking these medications         ciprofloxacin 500 MG tablet   Commonly known as: CIPRO       metroNIDAZOLE 500 MG tablet   Commonly known as: FLAGYL      predniSONE 5 MG tablet   Commonly known as: DELTASONE      TAKE these medications         allopurinol 150 mg Tabs   Commonly known as: ZYLOPRIM   Take 0.5 tablets (150 mg total) by mouth at bedtime.      b complex-vitamin c-folic acid 0.8 MG Tabs   Take 0.8 mg by mouth daily.      calcium carbonate (dosed in mg elemental calcium) 1250 MG/5ML   Take 5 mLs (500 mg of elemental calcium total) by mouth every 6 (six) hours as needed for heartburn.      doxazosin 8 MG tablet   Commonly known as: CARDURA   Take 8 mg by mouth 2 (two) times daily.      HYDROcodone-acetaminophen 5-500 MG per tablet   Commonly known as: VICODIN   Take 1 tablet by mouth every 6 (six) hours as needed. Back pain      labetalol 300 MG tablet   Commonly known as: NORMODYNE   Take 2 tablets (600 mg total) by mouth 2 (two) times daily.      lisinopril 10 MG tablet   Commonly known as: PRINIVIL,ZESTRIL   Take on non dialysis days      pravastatin 20 MG tablet   Commonly known as: PRAVACHOL   Take 20 mg by mouth daily.      promethazine 25 MG tablet   Commonly  known as: PHENERGAN   Take 25 mg by mouth every 6 (six) hours as needed. nausea      sorbitol 70 % Soln   Take 30 mLs by mouth daily as needed.      zolpidem 5 MG tablet   Commonly known as: AMBIEN   Take 5 mg by mouth at bedtime as needed. For insomnia          The results of significant diagnostics from this hospitalization (including imaging, microbiology, ancillary and laboratory) are listed below for reference.    Significant Diagnostic Studies: Dg Chest 2 View  11/28/2012  *RADIOLOGY REPORT*  Clinical Data: Chest pain and weakness.  CHEST - 2 VIEW  Comparison: 11/06/2012.  Findings: The heart is enlarged but stable.  There is tortuosity, ectasia and calcification of the thoracic aorta.  The pulmonary hila are slightly prominent but unchanged.  There are increased  interstitial markings, Kerley B lines and peribronchial thickening consistent with interstitial pulmonary edema.  Small bilateral pleural effusions are noted.  No focal infiltrate.  IMPRESSION:  Mild CHF.   Original Report Authenticated By: Marijo Sanes, M.D.    Dg Cholangiogram Operative  12/02/2012  *RADIOLOGY REPORT*  Clinical Data:   Cholecystitis  INTRAOPERATIVE CHOLANGIOGRAM  Technique:  Cholangiographic images from the C-arm fluoroscopic device were submitted for interpretation post-operatively.  Please see the procedural report for the amount of contrast and the fluoroscopy time utilized.  Comparison:  None  Findings:  No persistent filling defects in the common duct. Intrahepatic ducts are incompletely visualized, appearing decompressed centrally. Contrast passes into the duodenum.  IMPRESSION  Negative for retained common duct stone.   Original Report Authenticated By: D. Wallace Going, MD    US Abdomen Complete  11/07/2012  *RADIOLOGY REPORT*  Clinical Data:  Abdominal pain and fever.  ABDOMINAL ULTRASOUND COMPLETE  Comparison:  Renal ultrasound performed 03/04/2008, and MRI of the pelvis performed 08/19/2012  Findings:  Gallbladder: The gallbladder wall is thickened and edematous, measuring up to 1.0 cm in thickness.  A 5 mm polyp is noted near the base of the gallbladder.  No significant pericholecystic fluid is seen; a positive ultrasonographic Murphy's sign is elicited.  Common Bile Duct:  0.6 cm in diameter; within normal limits in caliber.  Liver:  Normal parenchymal echogenicity and echotexture; no focal lesions identified.  Limited Doppler evaluation demonstrates normal blood flow within the liver.  IVC:  Unremarkable in appearance.  Pancreas:  Although the pancreas is difficult to visualize in its entirety due to overlying bowel gas, no focal pancreatic abnormality is identified.  Spleen:  12.9 cm in length; measures 348 mL in volume, within normal limits, with scattered calcified  granulomata.  Right kidney:  5.8 cm in length; diffusely atrophic, with scattered small cysts.  Left kidney:  6.7 cm in length; diffusely atrophic, with scattered small cysts.  Abdominal Aorta:  Normal in caliber; no aneurysm identified. Plaque is noted along the course of the abdominal aorta.  Trace ascites is noted.  A small right pleural effusion is seen.  The patient's transplant kidney, noted within the pelvis, measures 10.1 cm in length, and demonstrates very mild hydronephrosis.  IMPRESSION:  1.  Diffusely thickened and edematous gallbladder wall, measuring up to 1.0 cm in thickness, with a positive ultrasonographic Murphy's sign; this raises concern for cholecystitis.  No definite stones identified on ultrasound; this could conceivably reflect acalculous cholecystitis.  Suggest clinical correlation. 2.  Apparent 5 mm polyp noted at the base of the gallbladder. 3.  Diffusely atrophic native kidneys, reflecting the patient's chronic renal failure. 4.  Very mild hydronephrosis noted with respect to the patient's transplant kidney in the pelvis.   Original Report Authenticated By: Santa Lighter, M.D.    Dg Chest Port 1 View  11/29/2012  *RADIOLOGY REPORT*  Clinical Data: Slight shortness of breath and left-sided chest pressure  PORTABLE CHEST - 1 VIEW  Comparison: Chest x-ray of 11/28/2012  Findings:   The interstitial edema pattern has improved.  Moderate cardiomegaly remains with minimal pulmonary vascular congestion. No bony abnormality is seen.  IMPRESSION: Improvement in interstitial edema.   Original Report Authenticated By: Ivar Drape, M.D.    Dg Chest Port 1 View  11/06/2012  *RADIOLOGY REPORT*  Clinical Data: Fever, shortness of breath and chest pain.  PORTABLE CHEST - 1 VIEW  Comparison: Chest x-ray 11/15/2009.  Findings: Interval development of the interstitial prominence and ill-defined air space disease in the right base.  This does not obscure either the right hemidiaphragm or the right  heart border. Fine pattern of peripheral micronodularity in the upper lobes of the lungs bilaterally are unchanged compared to prior examinations. No definite pleural effusions.  Pulmonary vasculature is normal. Heart size is mildly enlarged.  Mediastinal contours are unremarkable.  Atherosclerosis of the thoracic aorta.  Old healed fracture of the posterolateral aspect of the left seventh rib is again noted.  Orthopedic fixation hardware in the mid cervical spine incompletely visualized.  IMPRESSION: 1.  New ill-defined interstitial and airspace opacity in the right base, favored to be in the right lower lobe, concerning for bronchopneumonia. 2.  Radiographic appearance of the chest is otherwise essentially unchanged compared to prior examinations, as above.   Original Report Authenticated By: Vinnie Langton, M.D.    Dg Hand Complete Left  11/28/2012  *RADIOLOGY REPORT*  Clinical Data: Left hand pain and joint swelling without injury.  LEFT HAND - COMPLETE 3+ VIEW  Comparison: None.  Findings: Mild osteopenia. No acute fracture or dislocation. Extensive vascular calcifications. Amorphous calcifications about the radial volar aspect of the wrist may be the site of an AV fistula.  There are probable concurrent calcifications in the triangular fibrocartilage.  IMPRESSION: No acute osseous abnormality.  Probable calcification in the triangular fibrocartilage, suggesting calcium pyrophosphate deposition disease.  Extensive vascular calcifications,  including likely within a radial  fistula.   Original Report Authenticated By: Abigail Miyamoto, M.D.     Microbiology: Recent Results (from the past 240 hour(s))  CULTURE, BLOOD (ROUTINE X 2)     Status: Normal (Preliminary result)   Collection Time   11/28/12 10:00 AM      Component Value Range Status Comment   Specimen Description BLOOD RIGHT ARM   Final    Special Requests BOTTLES DRAWN AEROBIC ONLY 4CC   Final    Culture  Setup Time 11/28/2012 14:03   Final     Culture     Final    Value:        BLOOD CULTURE RECEIVED NO GROWTH TO DATE CULTURE WILL BE HELD FOR 5 DAYS BEFORE ISSUING A FINAL NEGATIVE REPORT   Report Status PENDING   Incomplete   CULTURE, BLOOD (ROUTINE X 2)     Status: Normal (Preliminary result)   Collection Time   11/28/12 10:10 AM      Component Value Range Status Comment   Specimen Description BLOOD RIGHT ARM   Final    Special Requests BOTTLES DRAWN AEROBIC ONLY 5CC   Final    Culture  Setup Time 11/28/2012 14:03   Final    Culture     Final    Value:        BLOOD CULTURE RECEIVED NO GROWTH TO DATE CULTURE WILL BE HELD FOR 5 DAYS BEFORE ISSUING A FINAL NEGATIVE REPORT   Report Status PENDING   Incomplete   SURGICAL PCR SCREEN     Status: Normal   Collection Time   12/01/12  8:09 PM      Component Value Range Status Comment   MRSA, PCR NEGATIVE  NEGATIVE Final    Staphylococcus aureus NEGATIVE  NEGATIVE Final      Labs: Basic Metabolic Panel:  Lab [id number] 0443 12/02/12 1541 12/02/12 0535 12/01/12 0530 11/30/12 0630  NA 134* 133* 137 140 138  K 5.7* 5.2* 4.1 4.4 3.9  CL 93* 94* 95* 98 96  CO2 24 23 24  32 27  GLUCOSE 143* 185* 95 92 100*  BUN 67* 57* 51* 26* 42*  CREATININE 7.85* 6.58* 5.87* 3.65* 5.84*  CALCIUM 8.9 9.2 9.7 9.2 9.6  MG -- -- -- -- --  PHOS 5.4* -- 3.0 3.1 5.1*   Liver Function Tests:  Lab 12/03/12 0443 12/02/12 0535 12/01/12 0530 11/30/12 0630 11/28/12 1000  AST -- -- -- -- 15  ALT -- -- -- -- 7  ALKPHOS -- -- -- -- 81  BILITOT -- -- -- -- 0.4  PROT -- -- -- -- 7.2  ALBUMIN 2.5* 2.6* 2.4* 2.5* 2.6*   No results found for this basename: LIPASE:5,AMYLASE:5 in the last 168 hours No results found for this basename: AMMONIA:5 in the last 168 hours CBC:  Lab 12/03/12 0500 12/02/12 0535 12/01/12 0530 11/30/12 0630 11/29/12 0505 11/28/12 1000  WBC 8.4 10.4 8.0 9.0 10.7* --  NEUTROABS -- -- -- 5.8 -- 7.5  HGB 8.1* 8.6* 8.2* 8.5* 8.9* --  HCT 25.1* 26.9* 26.2* 26.5* 28.5* --  MCV 96.2 96.1  98.1 96.7 98.6 --  PLT 221 220 203 192 196 --   Cardiac Enzymes:  Lab 11/28/12 1237  CKTOTAL --  CKMB --  CKMBINDEX --  TROPONINI <0.30   BNP: BNP (last 3 results)  Basename 11/28/12 1237  PROBNP 35557.0*   CBG: No results found for this basename: GLUCAP:5 in the last 168 hours     Signed:  Nita Sells  [facility] 12/04/2012, 9:44 AM

## 2012-12-05 ENCOUNTER — Encounter (HOSPITAL_COMMUNITY): Payer: Self-pay | Admitting: General Surgery

## 2012-12-06 ENCOUNTER — Other Ambulatory Visit: Payer: Self-pay | Admitting: Oncology

## 2012-12-13 DIAGNOSIS — N186 End stage renal disease: Secondary | ICD-10-CM | POA: Diagnosis not present

## 2012-12-15 DIAGNOSIS — E878 Other disorders of electrolyte and fluid balance, not elsewhere classified: Secondary | ICD-10-CM | POA: Diagnosis not present

## 2012-12-15 DIAGNOSIS — N2581 Secondary hyperparathyroidism of renal origin: Secondary | ICD-10-CM | POA: Diagnosis not present

## 2012-12-15 DIAGNOSIS — D631 Anemia in chronic kidney disease: Secondary | ICD-10-CM | POA: Diagnosis not present

## 2012-12-15 DIAGNOSIS — D509 Iron deficiency anemia, unspecified: Secondary | ICD-10-CM | POA: Diagnosis not present

## 2012-12-15 DIAGNOSIS — D539 Nutritional anemia, unspecified: Secondary | ICD-10-CM | POA: Diagnosis not present

## 2012-12-15 DIAGNOSIS — N186 End stage renal disease: Secondary | ICD-10-CM | POA: Diagnosis not present

## 2012-12-15 DIAGNOSIS — Z992 Dependence on renal dialysis: Secondary | ICD-10-CM | POA: Diagnosis not present

## 2012-12-19 ENCOUNTER — Emergency Department (HOSPITAL_COMMUNITY): Payer: Medicare Other

## 2012-12-19 ENCOUNTER — Emergency Department (HOSPITAL_COMMUNITY)
Admission: EM | Admit: 2012-12-19 | Discharge: 2012-12-19 | Disposition: A | Discharge status: Home Health | Payer: Medicare Other | Attending: Emergency Medicine | Admitting: Emergency Medicine

## 2012-12-19 ENCOUNTER — Encounter (HOSPITAL_COMMUNITY): Payer: Self-pay | Admitting: Adult Health

## 2012-12-19 ENCOUNTER — Telehealth (INDEPENDENT_AMBULATORY_CARE_PROVIDER_SITE_OTHER): Payer: Self-pay | Admitting: General Surgery

## 2012-12-19 DIAGNOSIS — Z87891 Personal history of nicotine dependence: Secondary | ICD-10-CM | POA: Insufficient documentation

## 2012-12-19 DIAGNOSIS — R0609 Other forms of dyspnea: Secondary | ICD-10-CM | POA: Diagnosis not present

## 2012-12-19 DIAGNOSIS — I120 Hypertensive chronic kidney disease with stage 5 chronic kidney disease or end stage renal disease: Secondary | ICD-10-CM | POA: Insufficient documentation

## 2012-12-19 DIAGNOSIS — I509 Heart failure, unspecified: Secondary | ICD-10-CM | POA: Insufficient documentation

## 2012-12-19 DIAGNOSIS — Z8669 Personal history of other diseases of the nervous system and sense organs: Secondary | ICD-10-CM | POA: Insufficient documentation

## 2012-12-19 DIAGNOSIS — Z940 Kidney transplant status: Secondary | ICD-10-CM | POA: Insufficient documentation

## 2012-12-19 DIAGNOSIS — E785 Hyperlipidemia, unspecified: Secondary | ICD-10-CM | POA: Insufficient documentation

## 2012-12-19 DIAGNOSIS — R0602 Shortness of breath: Secondary | ICD-10-CM | POA: Diagnosis not present

## 2012-12-19 DIAGNOSIS — R0789 Other chest pain: Secondary | ICD-10-CM | POA: Insufficient documentation

## 2012-12-19 DIAGNOSIS — R143 Flatulence: Secondary | ICD-10-CM | POA: Diagnosis not present

## 2012-12-19 DIAGNOSIS — Z860100 Personal history of colon polyps, unspecified: Secondary | ICD-10-CM | POA: Insufficient documentation

## 2012-12-19 DIAGNOSIS — Z8719 Personal history of other diseases of the digestive system: Secondary | ICD-10-CM | POA: Diagnosis not present

## 2012-12-19 DIAGNOSIS — R011 Cardiac murmur, unspecified: Secondary | ICD-10-CM | POA: Insufficient documentation

## 2012-12-19 DIAGNOSIS — N186 End stage renal disease: Secondary | ICD-10-CM | POA: Diagnosis not present

## 2012-12-19 DIAGNOSIS — R0600 Dyspnea, unspecified: Secondary | ICD-10-CM

## 2012-12-19 DIAGNOSIS — R1084 Generalized abdominal pain: Secondary | ICD-10-CM | POA: Diagnosis not present

## 2012-12-19 DIAGNOSIS — E8779 Other fluid overload: Secondary | ICD-10-CM | POA: Insufficient documentation

## 2012-12-19 DIAGNOSIS — Z8659 Personal history of other mental and behavioral disorders: Secondary | ICD-10-CM | POA: Insufficient documentation

## 2012-12-19 DIAGNOSIS — R142 Eructation: Secondary | ICD-10-CM | POA: Diagnosis not present

## 2012-12-19 DIAGNOSIS — R141 Gas pain: Secondary | ICD-10-CM | POA: Diagnosis not present

## 2012-12-19 DIAGNOSIS — R0989 Other specified symptoms and signs involving the circulatory and respiratory systems: Secondary | ICD-10-CM | POA: Insufficient documentation

## 2012-12-19 DIAGNOSIS — Z992 Dependence on renal dialysis: Secondary | ICD-10-CM | POA: Insufficient documentation

## 2012-12-19 DIAGNOSIS — E8770 Fluid overload, unspecified: Secondary | ICD-10-CM

## 2012-12-19 DIAGNOSIS — Z862 Personal history of diseases of the blood and blood-forming organs and certain disorders involving the immune mechanism: Secondary | ICD-10-CM | POA: Insufficient documentation

## 2012-12-19 DIAGNOSIS — Z79899 Other long term (current) drug therapy: Secondary | ICD-10-CM | POA: Insufficient documentation

## 2012-12-19 DIAGNOSIS — I10 Essential (primary) hypertension: Secondary | ICD-10-CM

## 2012-12-19 LAB — COMPREHENSIVE METABOLIC PANEL
Albumin: 3 g/dL — ABNORMAL LOW (ref 3.5–5.2)
BUN: 25 mg/dL — ABNORMAL HIGH (ref 6–23)
Calcium: 10.4 mg/dL (ref 8.4–10.5)
Chloride: 95 mEq/L — ABNORMAL LOW (ref 96–112)
Creatinine, Ser: 6.49 mg/dL — ABNORMAL HIGH (ref 0.50–1.10)
Total Bilirubin: 0.4 mg/dL (ref 0.3–1.2)
Total Protein: 7 g/dL (ref 6.0–8.3)

## 2012-12-19 LAB — LIPASE, BLOOD: Lipase: 48 U/L (ref 11–59)

## 2012-12-19 LAB — CBC
Platelets: 177 10*3/uL (ref 150–400)
RBC: 2.93 MIL/uL — ABNORMAL LOW (ref 3.87–5.11)
RDW: 18.2 % — ABNORMAL HIGH (ref 11.5–15.5)
WBC: 7.5 10*3/uL (ref 4.0–10.5)

## 2012-12-19 LAB — POCT I-STAT TROPONIN I: Troponin i, poc: 0.01 ng/mL (ref 0.00–0.08)

## 2012-12-19 LAB — BASIC METABOLIC PANEL
Calcium: 10.5 mg/dL (ref 8.4–10.5)
Creatinine, Ser: 6.54 mg/dL — ABNORMAL HIGH (ref 0.50–1.10)
GFR calc Af Amer: 7 mL/min — ABNORMAL LOW (ref >90)
GFR calc non Af Amer: 6 mL/min — ABNORMAL LOW (ref >90)
Sodium: 136 mEq/L (ref 135–145)

## 2012-12-19 LAB — PRO B NATRIURETIC PEPTIDE: Pro B Natriuretic peptide (BNP): 31752 pg/mL — ABNORMAL HIGH (ref 0–125)

## 2012-12-19 MED ORDER — MORPHINE SULFATE 4 MG/ML IJ SOLN
4.0000 mg | Freq: Once | INTRAMUSCULAR | Status: AC
  Administered 2012-12-19: 4 mg via INTRAVENOUS
  Filled 2012-12-19: qty 1

## 2012-12-19 NOTE — ED Provider Notes (Addendum)
History     CSN: [id number]  Arrival date & time 12/19/12  0235   First MD Initiated Contact with Patient 12/19/12 0256      Chief Complaint  Patient presents with  . Shortness of Breath    (Consider location/radiation/quality/duration/timing/severity/associated sxs/prior treatment) HPI 62 year old female presents to the emergency department with complaint of shortness of breath starting yesterday evening. She is unable to lay flat due to to shortness of breath. She also complains of generalized abdominal distention and diffuse pain slightly worse on the left and across her lower abdomen. Patient feels bloated and full. Patient had dialysis on Saturday, but feels that they've been out of taken off enough fluid. She has some pain to her left upper abdomen/left lower chest with deep breathing. Patient had her gallbladder removed 2 weeks ago, denies any fevers, vomiting, drainage from port sites. Patient is Tuesday Thursday Saturday dialysis.  Past Medical History  Diagnosis Date  . Anemia   . Anxiety   . Blood transfusion   . Depression   . GERD (gastroesophageal reflux disease)   . Hyperlipidemia   . Hypertension   . Chronic kidney disease     transplant 1996  . Neuromuscular disorder     neuropathy hand and legs  . Weight loss, unintentional   . Adenomatous polyp of colon 10/2010  . Diverticula, colon   . Heart murmur   . CHF (congestive heart failure)     Past Surgical History  Procedure Date  . Kidney transplant 1996  . Upper gastrointestinal endoscopy   . Back surgery   . Cervical fusion   . Av fistula placement     for dialysis  . Colonoscopy   . Cholecystectomy 12/02/2012    Procedure: LAPAROSCOPIC CHOLECYSTECTOMY WITH INTRAOPERATIVE CHOLANGIOGRAM;  Surgeon: Edward Jolly, MD;  Location: [facility] OR;  Service: General;  Laterality: N/A;    Family History  Problem Relation Age of Onset  . Colon cancer Brother   . Coronary artery disease Mother 37    History   Substance Use Topics  . Smoking status: Former Smoker    Types: Cigarettes    Quit date: 12/31/1991  . Smokeless tobacco: Never Used  . Alcohol Use: No    OB History    Grav Para Term Preterm Abortions TAB SAB Ect Mult Living                  Review of Systems  See History of Present Illness; otherwise all other systems are reviewed and negative  Allergies  Sulfa antibiotics  Home Medications   Current Outpatient Rx  Name  Route  Sig  Dispense  Refill  . ALLOPURINOL 300 MG PO TABS   Oral   Take 150 mg by mouth at bedtime.         Marland Kitchen NEPHRO-VITE 0.8 MG PO TABS   Oral   Take 0.8 mg by mouth daily.          Marland Kitchen CALCIUM CARBONATE ANTACID 500 MG PO CHEW   Oral   Chew 1 tablet by mouth 3 (three) times daily as needed. For acid         . DOXAZOSIN MESYLATE 8 MG PO TABS   Oral   Take 8 mg by mouth 2 (two) times daily.           Marland Kitchen HYDROCODONE-ACETAMINOPHEN 5-500 MG PO TABS   Oral   Take 1 tablet by mouth every 6 (six) hours as needed. Back pain         .  LABETALOL HCL 300 MG PO TABS   Oral   Take 300 mg by mouth 2 (two) times daily.         Marland Kitchen LISINOPRIL 10 MG PO TABS   Oral   Take 10 mg by mouth 4 (four) times a week. Take on non dialysis days Sunday, Monday, Wednesday and Friday         . PRAVASTATIN SODIUM 20 MG PO TABS   Oral   Take 20 mg by mouth daily.           Marland Kitchen PROMETHAZINE HCL 25 MG PO TABS   Oral   Take 25 mg by mouth every 6 (six) hours as needed. nausea          . SORBITOL 70 % SOLN   Oral   Take 30 mLs by mouth daily as needed. For constipation         . ZOLPIDEM TARTRATE 5 MG PO TABS   Oral   Take 5 mg by mouth at bedtime as needed. For insomnia           BP 179/94  Temp 98.1 F (36.7 C) (Oral)  Resp 18  SpO2 100%  Physical Exam  Nursing note and vitals reviewed. Constitutional: She is oriented to person, place, and time. She appears well-developed and well-nourished. She appears distressed (uncomfortable  appearing).  HENT:  Head: Normocephalic and atraumatic.  Nose: Nose normal.  Mouth/Throat: Oropharynx is clear and moist.  Eyes: Conjunctivae normal and EOM are normal. Pupils are equal, round, and reactive to light.  Neck: Normal range of motion. Neck supple. No JVD present. No tracheal deviation present. No thyromegaly present.  Cardiovascular: Normal rate, regular rhythm, normal heart sounds and intact distal pulses.  Exam reveals no gallop and no friction rub.   No murmur heard. Pulmonary/Chest: Effort normal. No stridor. No respiratory distress. She has no wheezes. She has no rales. She exhibits no tenderness.       Diminished in the bases  Abdominal: Soft. Bowel sounds are normal. She exhibits no distension and no mass. There is no tenderness. There is no rebound and no guarding.       Incision sites are well-healed. Patient with mild lower abdominal pain worse in the left side  Musculoskeletal: Normal range of motion. She exhibits no edema and no tenderness.  Lymphadenopathy:    She has no cervical adenopathy.  Neurological: She is alert and oriented to person, place, and time. She exhibits normal muscle tone. Coordination normal.  Skin: Skin is dry. No rash noted. No erythema. No pallor.  Psychiatric: She has a normal mood and affect. Her behavior is normal. Judgment and thought content normal.    ED Course  Procedures (including critical care time)  Labs Reviewed  BASIC METABOLIC PANEL - Abnormal; Notable for the following:    Chloride 94 (*)     BUN 24 (*)     Creatinine, Ser 6.54 (*)     GFR calc non Af Amer 6 (*)     GFR calc Af Amer 7 (*)     All other components within normal limits  CBC - Abnormal; Notable for the following:    RBC 2.93 (*)     Hemoglobin 8.9 (*)     HCT 28.6 (*)     RDW 18.2 (*)     All other components within normal limits  PRO B NATRIURETIC PEPTIDE - Abnormal; Notable for the following:    Pro B Natriuretic peptide (BNP)  31752.0 (*)     All  other components within normal limits  COMPREHENSIVE METABOLIC PANEL - Abnormal; Notable for the following:    Chloride 95 (*)     BUN 25 (*)     Creatinine, Ser 6.49 (*)     Albumin 3.0 (*)     GFR calc non Af Amer 6 (*)     GFR calc Af Amer 7 (*)     All other components within normal limits  LIPASE, BLOOD  POCT I-STAT TROPONIN I   Dg Chest 2 View  12/19/2012  *RADIOLOGY REPORT*  Clinical Data: Shortness of breath.  CHEST - 2 VIEW  Comparison: PA and lateral chest 09/11/2008 and 11/28/2012 and single view of the chest 11/29/2012.  CT chest 05/17/2008.  Findings: There is marked cardiomegaly.  Pulmonary vascular congestion without frank edema is noted.  No pneumothorax or pleural fluid.  Remote left rib fracture noted.  IMPRESSION: Cardiomegaly and pulmonary vascular congestion without acute abnormality.   Original Report Authenticated By: Orlean Patten, M.D.    Dg Abd 2 Views  12/19/2012  *RADIOLOGY REPORT*  Clinical Data: Abdominal pain and distention.  Status post cholecystectomy 12/02/2012.  ABDOMEN - 2 VIEW  Comparison: Single view of the chest 11/29/2012.  Findings: The bowel gas pattern is unremarkable.  No free intraperitoneal air is identified.  Cholecystectomy clips are noted.  Postoperative change lower lumbar spine is seen.  There is partial visualization of cardiomegaly and bibasilar atelectasis.  IMPRESSION: No acute finding.   Original Report Authenticated By: Orlean Patten, M.D.     Date: 12/19/2012  Rate:85  Rhythm: normal sinus rhythm  QRS Axis: normal  Intervals: normal  ST/T Wave abnormalities: normal  Conduction Disutrbances:none  Narrative Interpretation: LVH  Old EKG Reviewed: unchanged    1. Dyspnea   2. Volume overload   3. ESRD (end stage renal disease)   4. HTN (hypertension)       MDM  62 year old female with dyspnea. Differential includes CHF exacerbation, fluid overload from renal failure, PE. We'll check chest x-ray, labs. Patient also  complaining of bloating and constipation, will check acute abdominal series.      Workup here shows elevated BNP, chest x-ray with congestion without frank edema. Patient with crackles on exam. A lid patient with pulse ox, and she is not hypoxic but is easily winded after just a few steps, very fatigued. I do not feel patient will be able to wait until Tuesday for dialysis. Will discuss with renal for early dialysis and hospitalist for admission.  Kalman Drape, MD 12/19/12 [phone]  Discuss case with Dr. Jonnie Finner on-call for nephrology. He has arranged for a dialysis session with her dialysis clinic. She is to proceed there this morning after discharge from the ED.  Kalman Drape, MD 12/19/12 [phone]

## 2012-12-19 NOTE — ED Notes (Signed)
Reports SOB that began this evening while laying down associated with sternal chest pain that radiates to abdomen 'feels like I can not get a burp out" Dialysis pt with days T, TH, Sat. Had Dialysis Saturday but the nephrologist lowered her dialysis treatment due to hypertension. She states, "I feel bloated and full of fluid" respirations are even and unlabored. No edema noted to lower extremities. Sob is associated with pain with deep inspiration.  Post cholecystectomy 2 weeks ago.

## 2012-12-19 NOTE — ED Notes (Signed)
While ambulating patient became sob with minimal exertion, patient O2 sats on room air 96-100%,

## 2012-12-19 NOTE — ED Notes (Signed)
Patient in xray at this time.

## 2012-12-19 NOTE — Telephone Encounter (Signed)
Pt with ESRD and CHF called at 2 am with sudden increase in shortness of breath.  Advised to go to ED to evaluate if heart failure exacerbation vs PE.  Pt not due for HD until Tuesday.   Discussed with EDP and charge nurse at [facility] to expect her arrival. Advised her to call EMS.

## 2012-12-29 ENCOUNTER — Telehealth (INDEPENDENT_AMBULATORY_CARE_PROVIDER_SITE_OTHER): Payer: Self-pay

## 2012-12-29 NOTE — Telephone Encounter (Signed)
Message copied by Ivor Costa on Thu Dec 29, 2012  5:05 PM ------      Message from: Terisa Starr      Created: Thu Dec 15, 2012  4:19 PM      Contact: [phone]       Pt need po apt no apt available until 2/14

## 2012-12-29 NOTE — Telephone Encounter (Signed)
Post op appointment given for 12/30/12 @ 10:45 am w/Dr. Excell Seltzer

## 2012-12-30 ENCOUNTER — Encounter (INDEPENDENT_AMBULATORY_CARE_PROVIDER_SITE_OTHER): Payer: Self-pay | Admitting: General Surgery

## 2012-12-30 ENCOUNTER — Ambulatory Visit (INDEPENDENT_AMBULATORY_CARE_PROVIDER_SITE_OTHER): Payer: Medicare Other | Admitting: General Surgery

## 2012-12-30 VITALS — BP 128/78 | HR 70 | Temp 97.1°F | Resp 16 | Ht 62.0 in | Wt 132.0 lb

## 2012-12-30 DIAGNOSIS — Z09 Encounter for follow-up examination after completed treatment for conditions other than malignant neoplasm: Secondary | ICD-10-CM

## 2012-12-30 NOTE — Progress Notes (Signed)
History: Patient returns for postop visit proximally one month following laparoscopic cholecystectomy. She had had a previous episode of documented acute cholecystitis but was medically unstable at that time and surgery was deferred. The patient was admitted in December for unrelated problems which were stabilized and she was having some ongoing right upper quadrant discomfort. We elected to go ahead with laparoscopic cholecystectomy during the hospitalization which she tolerated well. She reports she currently is battling some anemia and feels somewhat tired. She however is not having any further abdominal discomfort or GI complaints.  Exam: BP 128/78  Pulse 70  Temp 97.1 F (36.2 C) (Temporal)  Resp 16  Ht 5\' 2"  (1.575 m)  Wt 132 lb (59.875 kg)  BMI 24.14 kg/m2 General: Appears well Abdomen: Soft and nontender her wounds well healed  Pathology: Revealed chronic cholecystitis, cholelithiasis and cholesterolosis  Assessment and plan: Doing well following laparoscopic cholecystectomy without apparent complication. She is discharged return as needed.

## 2013-01-13 DIAGNOSIS — N186 End stage renal disease: Secondary | ICD-10-CM | POA: Diagnosis not present

## 2013-01-14 DIAGNOSIS — D631 Anemia in chronic kidney disease: Secondary | ICD-10-CM | POA: Diagnosis not present

## 2013-01-14 DIAGNOSIS — D509 Iron deficiency anemia, unspecified: Secondary | ICD-10-CM | POA: Diagnosis not present

## 2013-01-14 DIAGNOSIS — N186 End stage renal disease: Secondary | ICD-10-CM | POA: Diagnosis not present

## 2013-01-14 DIAGNOSIS — N2581 Secondary hyperparathyroidism of renal origin: Secondary | ICD-10-CM | POA: Diagnosis not present

## 2013-01-23 DIAGNOSIS — H2510 Age-related nuclear cataract, unspecified eye: Secondary | ICD-10-CM | POA: Diagnosis not present

## 2013-02-10 DIAGNOSIS — N186 End stage renal disease: Secondary | ICD-10-CM | POA: Diagnosis not present

## 2013-02-11 DIAGNOSIS — D509 Iron deficiency anemia, unspecified: Secondary | ICD-10-CM | POA: Diagnosis not present

## 2013-02-11 DIAGNOSIS — D631 Anemia in chronic kidney disease: Secondary | ICD-10-CM | POA: Diagnosis not present

## 2013-02-11 DIAGNOSIS — N186 End stage renal disease: Secondary | ICD-10-CM | POA: Diagnosis not present

## 2013-02-11 DIAGNOSIS — Z992 Dependence on renal dialysis: Secondary | ICD-10-CM | POA: Diagnosis not present

## 2013-02-11 DIAGNOSIS — N2581 Secondary hyperparathyroidism of renal origin: Secondary | ICD-10-CM | POA: Diagnosis not present

## 2013-02-13 DIAGNOSIS — H2510 Age-related nuclear cataract, unspecified eye: Secondary | ICD-10-CM | POA: Diagnosis not present

## 2013-02-22 DIAGNOSIS — H2510 Age-related nuclear cataract, unspecified eye: Secondary | ICD-10-CM | POA: Diagnosis not present

## 2013-02-22 DIAGNOSIS — H353290 Exudative age-related macular degeneration, unspecified eye, stage unspecified: Secondary | ICD-10-CM | POA: Diagnosis not present

## 2013-02-22 DIAGNOSIS — H35059 Retinal neovascularization, unspecified, unspecified eye: Secondary | ICD-10-CM | POA: Diagnosis not present

## 2013-03-13 DIAGNOSIS — N186 End stage renal disease: Secondary | ICD-10-CM | POA: Diagnosis not present

## 2013-03-14 DIAGNOSIS — D631 Anemia in chronic kidney disease: Secondary | ICD-10-CM | POA: Diagnosis not present

## 2013-03-14 DIAGNOSIS — Z992 Dependence on renal dialysis: Secondary | ICD-10-CM | POA: Diagnosis not present

## 2013-03-14 DIAGNOSIS — N2581 Secondary hyperparathyroidism of renal origin: Secondary | ICD-10-CM | POA: Diagnosis not present

## 2013-03-14 DIAGNOSIS — N186 End stage renal disease: Secondary | ICD-10-CM | POA: Diagnosis not present

## 2013-03-14 DIAGNOSIS — D509 Iron deficiency anemia, unspecified: Secondary | ICD-10-CM | POA: Diagnosis not present

## 2013-03-15 DIAGNOSIS — B373 Candidiasis of vulva and vagina: Secondary | ICD-10-CM | POA: Diagnosis not present

## 2013-03-15 DIAGNOSIS — Z1212 Encounter for screening for malignant neoplasm of rectum: Secondary | ICD-10-CM | POA: Diagnosis not present

## 2013-03-15 DIAGNOSIS — Z124 Encounter for screening for malignant neoplasm of cervix: Secondary | ICD-10-CM | POA: Diagnosis not present

## 2013-03-20 DIAGNOSIS — H353290 Exudative age-related macular degeneration, unspecified eye, stage unspecified: Secondary | ICD-10-CM | POA: Diagnosis not present

## 2013-03-28 IMAGING — MR MR PELVIS W/O CM
4 of 5 series · 19 of 48 positions shown · non-contrast
Comparison: None.

CLINICAL DATA: Bleeding from bladder, concern for urethral
diverticulum

MRI PELVIS WITHOUT CONTRAST
TECHNIQUE: Multi-planar multi-sequence MR imaging of the pelvis
was performed following the standard protocol. No intravenous
contrast was administered.

[Series 8: T2 fat-sat · sagittal · 5.0mm · 0.47mm/px · 9 of 30 slices shown (1 of 2)]
[im 1/30]
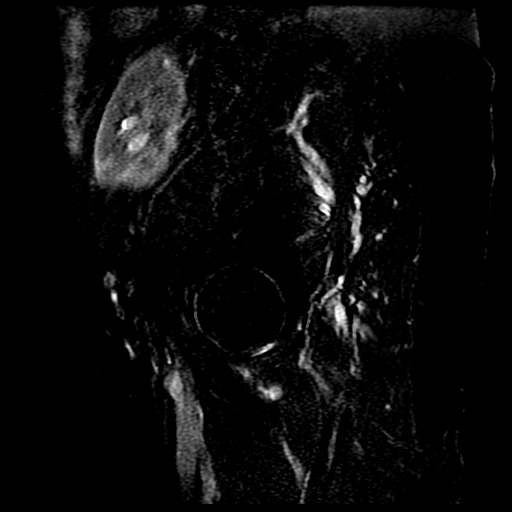
[im 4/30]
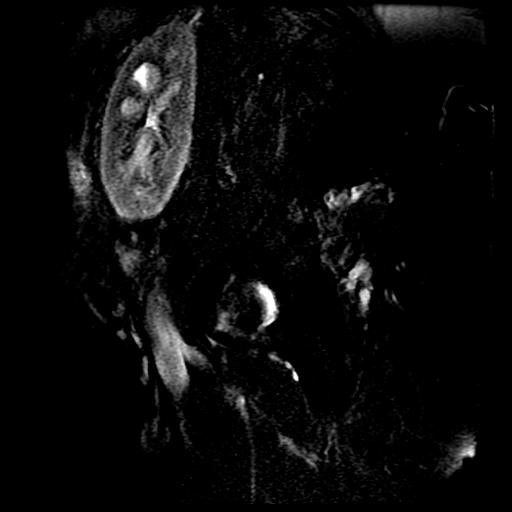
[im 8/30]
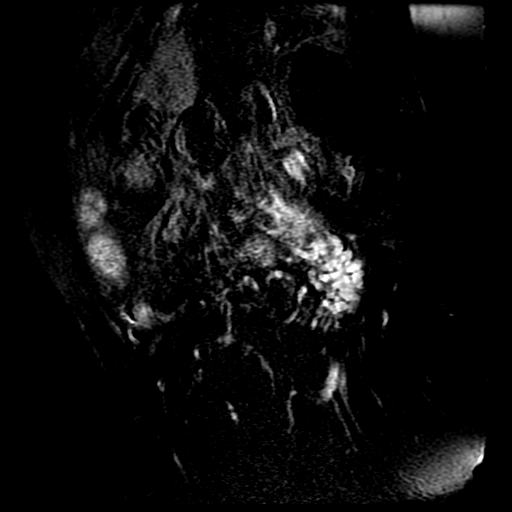
[im 11/30]
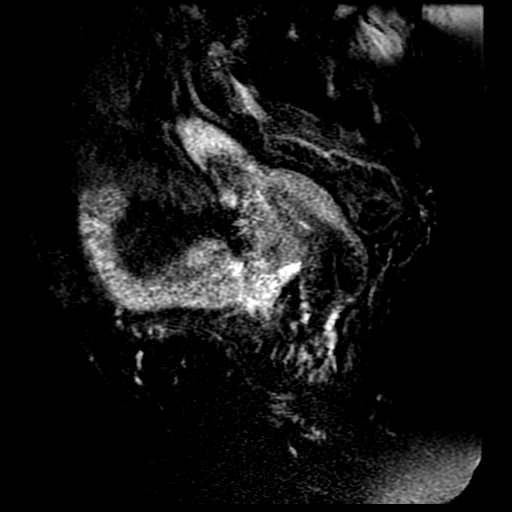
[im 15/30]
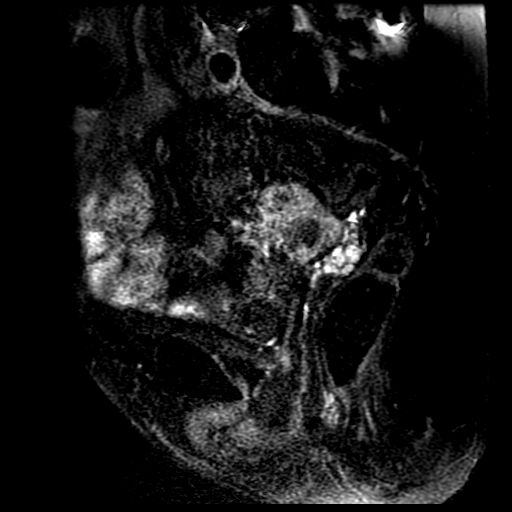
[im 19/30]
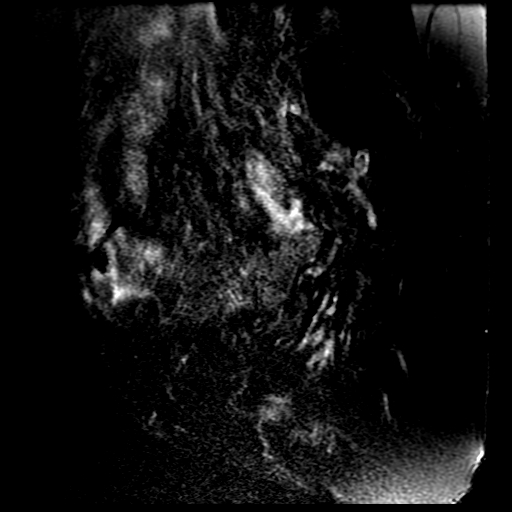
[im 22/30]
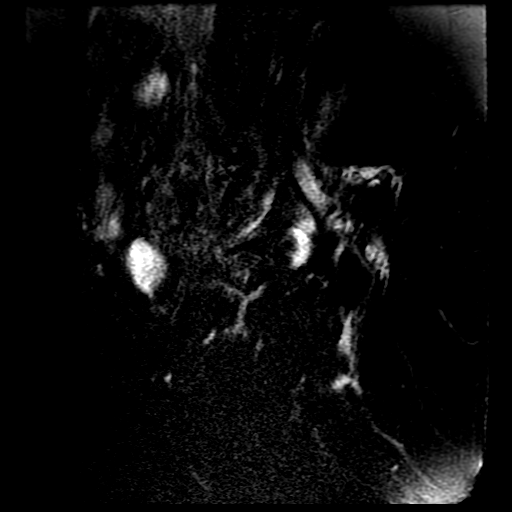
[im 26/30]
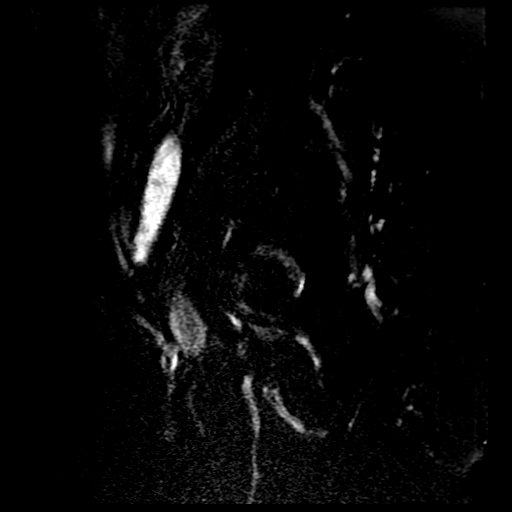
[im 30/30]
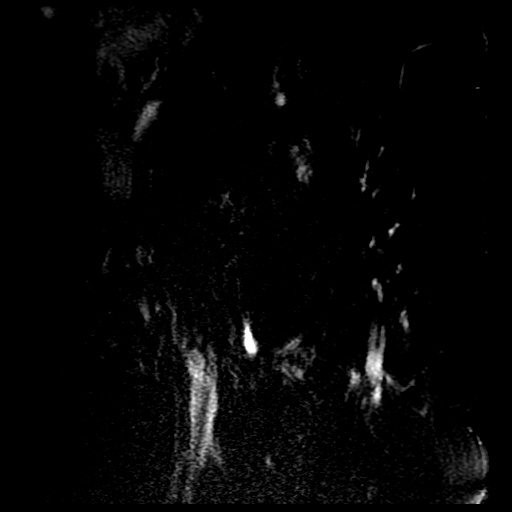

[Series 9: T2 · sagittal · 5.0mm · 0.47mm/px · 4 of 30 slices shown (1 of 2)]
[im 1/30]
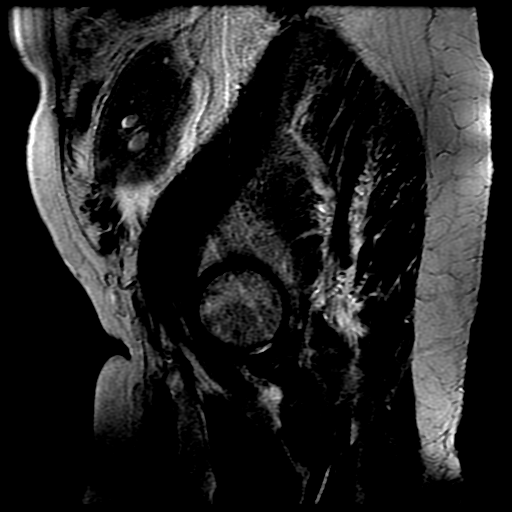
[im 4/30]
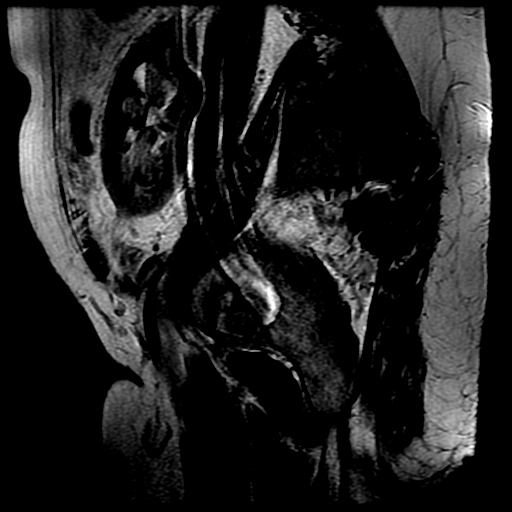
[im 15/30]
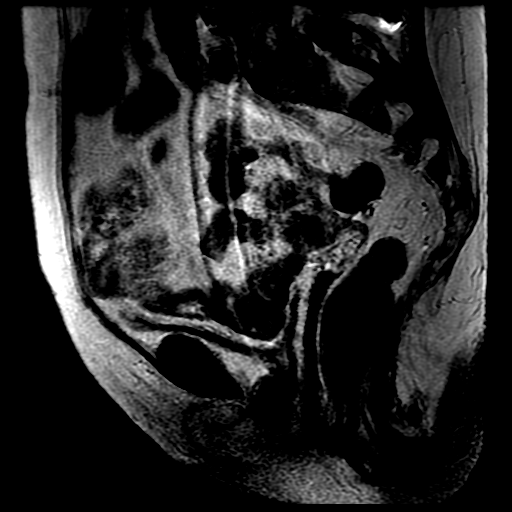
[im 26/30]
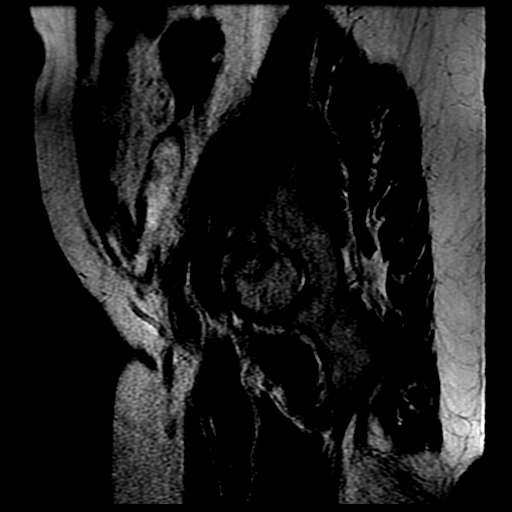

[Series 10: T2 · axial · 5.0mm · 0.47mm/px · z∈[-164,+4]mm · 3 of 36 slices shown (2 of 2)]
[im 4/36]
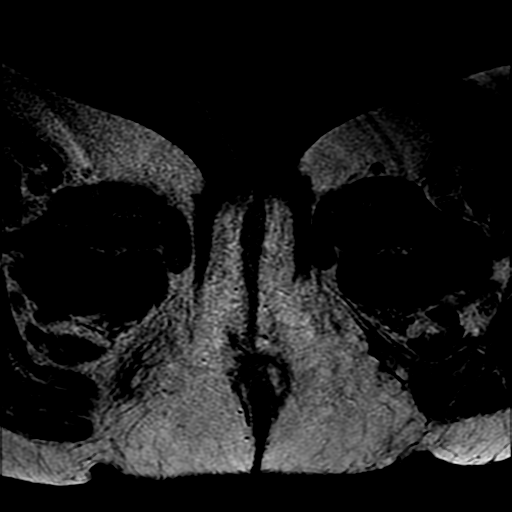
[im 18/36]
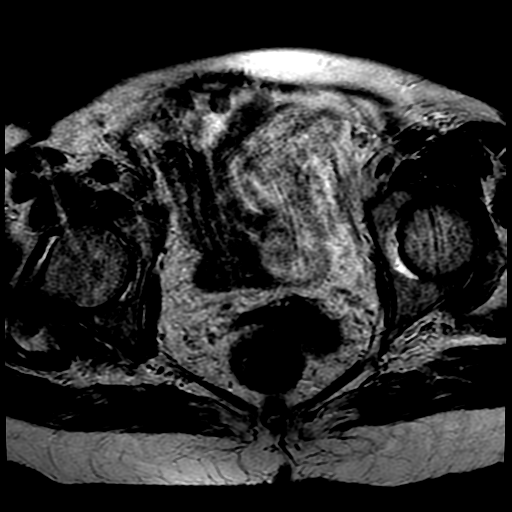
[im 32/36]
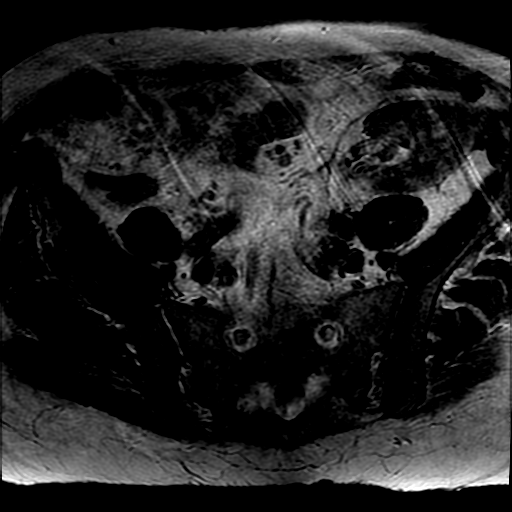

[Series 11: T2 fat-sat · axial · 5.0mm · 0.47mm/px · z∈[-164,+4]mm · 3 of 36 slices shown (2 of 2)]
[im 4/36]
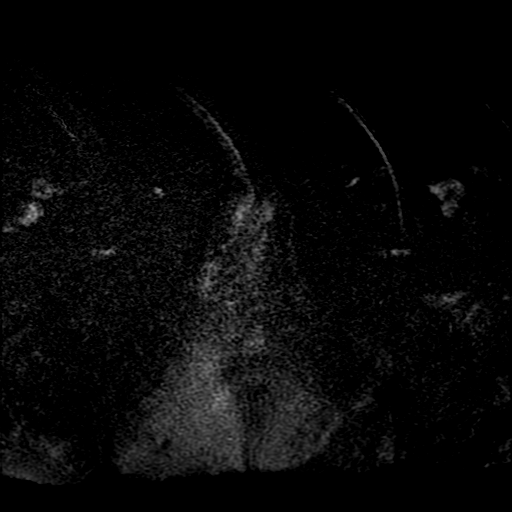
[im 18/36]
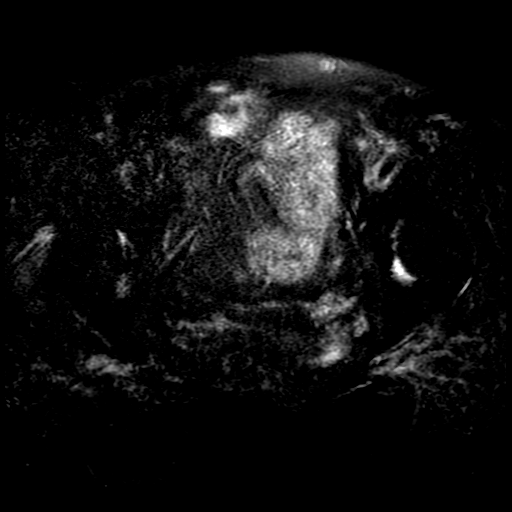
[im 32/36]
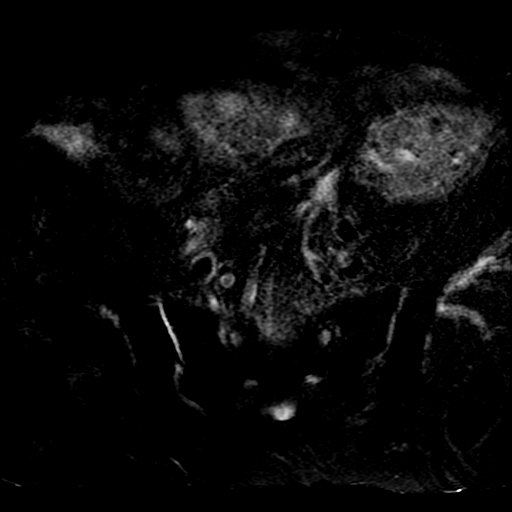

[19 of 48 positions shown; findings below may reference images not displayed]

FINDINGS: The bladder is collapsed.  The urethra appears normal
without evidence of diverticulum.

The uterus and vagina appear normal. There are several round
leiomyomas within the uterine fundus measuring up to 2.5 cm.  Small
11 mm cyst associated with the left ovary.

Transplanted kidney in the left iliac fossa.  No pelvic
lymphadenopathy.  No aggressive osseous lesions.
IMPRESSION: 1..  No urethral diverticulum.
2.  Bladder is decompressed.
3.  Leiomyomas within the uterus

## 2013-03-30 ENCOUNTER — Encounter: Payer: Self-pay | Admitting: *Deleted

## 2013-03-31 ENCOUNTER — Encounter (HOSPITAL_COMMUNITY): Payer: Self-pay | Admitting: Emergency Medicine

## 2013-03-31 ENCOUNTER — Inpatient Hospital Stay (HOSPITAL_COMMUNITY)
Admission: EM | Admit: 2013-03-31 | Discharge: 2013-04-02 | DRG: 682 | Disposition: A | Discharge status: Home / Self Care | Payer: Medicare Other | Attending: Internal Medicine | Admitting: Internal Medicine

## 2013-03-31 ENCOUNTER — Emergency Department (HOSPITAL_COMMUNITY): Payer: Medicare Other

## 2013-03-31 DIAGNOSIS — D649 Anemia, unspecified: Secondary | ICD-10-CM | POA: Diagnosis present

## 2013-03-31 DIAGNOSIS — K810 Acute cholecystitis: Secondary | ICD-10-CM

## 2013-03-31 DIAGNOSIS — K219 Gastro-esophageal reflux disease without esophagitis: Secondary | ICD-10-CM | POA: Diagnosis present

## 2013-03-31 DIAGNOSIS — F3289 Other specified depressive episodes: Secondary | ICD-10-CM | POA: Diagnosis present

## 2013-03-31 DIAGNOSIS — N2581 Secondary hyperparathyroidism of renal origin: Secondary | ICD-10-CM | POA: Diagnosis not present

## 2013-03-31 DIAGNOSIS — N186 End stage renal disease: Secondary | ICD-10-CM | POA: Diagnosis present

## 2013-03-31 DIAGNOSIS — I120 Hypertensive chronic kidney disease with stage 5 chronic kidney disease or end stage renal disease: Secondary | ICD-10-CM | POA: Diagnosis not present

## 2013-03-31 DIAGNOSIS — Z87891 Personal history of nicotine dependence: Secondary | ICD-10-CM | POA: Diagnosis not present

## 2013-03-31 DIAGNOSIS — I10 Essential (primary) hypertension: Secondary | ICD-10-CM

## 2013-03-31 DIAGNOSIS — F329 Major depressive disorder, single episode, unspecified: Secondary | ICD-10-CM | POA: Diagnosis present

## 2013-03-31 DIAGNOSIS — J810 Acute pulmonary edema: Secondary | ICD-10-CM

## 2013-03-31 DIAGNOSIS — I509 Heart failure, unspecified: Secondary | ICD-10-CM | POA: Diagnosis present

## 2013-03-31 DIAGNOSIS — F411 Generalized anxiety disorder: Secondary | ICD-10-CM | POA: Diagnosis present

## 2013-03-31 DIAGNOSIS — Z940 Kidney transplant status: Secondary | ICD-10-CM | POA: Diagnosis not present

## 2013-03-31 DIAGNOSIS — R0602 Shortness of breath: Secondary | ICD-10-CM | POA: Diagnosis not present

## 2013-03-31 DIAGNOSIS — E8770 Fluid overload, unspecified: Secondary | ICD-10-CM

## 2013-03-31 DIAGNOSIS — E785 Hyperlipidemia, unspecified: Secondary | ICD-10-CM | POA: Diagnosis present

## 2013-03-31 DIAGNOSIS — J811 Chronic pulmonary edema: Secondary | ICD-10-CM

## 2013-03-31 DIAGNOSIS — I5030 Unspecified diastolic (congestive) heart failure: Secondary | ICD-10-CM | POA: Diagnosis present

## 2013-03-31 DIAGNOSIS — Z992 Dependence on renal dialysis: Secondary | ICD-10-CM

## 2013-03-31 DIAGNOSIS — I369 Nonrheumatic tricuspid valve disorder, unspecified: Secondary | ICD-10-CM | POA: Diagnosis not present

## 2013-03-31 DIAGNOSIS — D631 Anemia in chronic kidney disease: Secondary | ICD-10-CM

## 2013-03-31 DIAGNOSIS — E8779 Other fluid overload: Secondary | ICD-10-CM | POA: Diagnosis not present

## 2013-03-31 DIAGNOSIS — E119 Type 2 diabetes mellitus without complications: Secondary | ICD-10-CM | POA: Diagnosis present

## 2013-03-31 HISTORY — DX: End stage renal disease: N18.6

## 2013-03-31 LAB — COMPREHENSIVE METABOLIC PANEL
ALT: 9 U/L (ref 0–35)
Alkaline Phosphatase: 133 U/L — ABNORMAL HIGH (ref 39–117)
CO2: 30 mEq/L (ref 19–32)
GFR calc Af Amer: 9 mL/min — ABNORMAL LOW (ref >90)
GFR calc non Af Amer: 8 mL/min — ABNORMAL LOW (ref >90)
Glucose, Bld: 78 mg/dL (ref 70–99)
Potassium: 4.2 mEq/L (ref 3.5–5.1)
Sodium: 134 mEq/L — ABNORMAL LOW (ref 135–145)

## 2013-03-31 LAB — COMPREHENSIVE METABOLIC PANEL WITH GFR
AST: 24 U/L (ref 0–37)
Albumin: 2.8 g/dL — ABNORMAL LOW (ref 3.5–5.2)
BUN: 31 mg/dL — ABNORMAL HIGH (ref 6–23)
Calcium: 8.7 mg/dL (ref 8.4–10.5)
Chloride: 92 meq/L — ABNORMAL LOW (ref 96–112)
Creatinine, Ser: 5.17 mg/dL — ABNORMAL HIGH (ref 0.50–1.10)
Total Bilirubin: 0.5 mg/dL (ref 0.3–1.2)
Total Protein: 7.1 g/dL (ref 6.0–8.3)

## 2013-03-31 LAB — CBC WITH DIFFERENTIAL/PLATELET
Basophils Absolute: 0 K/uL (ref 0.0–0.1)
Basophils Relative: 0 % (ref 0–1)
Eosinophils Absolute: 0.6 K/uL (ref 0.0–0.7)
Eosinophils Relative: 9 % — ABNORMAL HIGH (ref 0–5)
HCT: 30.5 % — ABNORMAL LOW (ref 36.0–46.0)
Hemoglobin: 9.4 g/dL — ABNORMAL LOW (ref 12.0–15.0)
Lymphocytes Relative: 31 % (ref 12–46)
Lymphs Abs: 2.1 10*3/uL (ref 0.7–4.0)
MCH: 25.6 pg — ABNORMAL LOW (ref 26.0–34.0)
MCHC: 30.8 g/dL (ref 30.0–36.0)
MCV: 83.1 fL (ref 78.0–100.0)
Monocytes Absolute: 0.7 K/uL (ref 0.1–1.0)
Monocytes Relative: 10 % (ref 3–12)
Neutro Abs: 3.5 K/uL (ref 1.7–7.7)
Neutrophils Relative %: 51 % (ref 43–77)
Platelets: 254 10*3/uL (ref 150–400)
RBC: 3.67 MIL/uL — ABNORMAL LOW (ref 3.87–5.11)
RDW: 18.7 % — ABNORMAL HIGH (ref 11.5–15.5)
WBC: 6.8 10*3/uL (ref 4.0–10.5)

## 2013-03-31 LAB — PRO B NATRIURETIC PEPTIDE: Pro B Natriuretic peptide (BNP): 56409 pg/mL — ABNORMAL HIGH (ref 0–125)

## 2013-03-31 NOTE — ED Provider Notes (Signed)
History     CSN: [id number]  Arrival date & time 03/31/13  [id number]   First MD Initiated Contact with Patient 03/31/13 2255      Chief Complaint  Patient presents with  . Shortness of Breath    (Consider location/radiation/quality/duration/timing/severity/associated sxs/prior treatment) HPI Comments: Pt with hx of esrd (hd - t/tr/sat), last session on tr, iddm, diastolic chf comes in with cc of dib. Pt states that her dib started earlier today. There is no chest pain associated with that. Pt has no nausea, diaphoresis, cough, fevers. Pt thinks that she might be fluid overloaded. No wheez, no orthopnea, PND. Pt has no MI hx.  Pt had a recent cholecystectomy. No hx of PE, DVT.  Patient is a 62 y.o. female presenting with shortness of breath. The history is provided by the patient and medical records.  Shortness of Breath Associated symptoms: no abdominal pain, no chest pain, no cough, no neck pain, no vomiting and no wheezing     Past Medical History  Diagnosis Date  . Anemia   . Anxiety   . Blood transfusion   . Depression   . GERD (gastroesophageal reflux disease)   . Hyperlipidemia   . Hypertension   . Chronic kidney disease     transplant 1996  . Neuromuscular disorder     neuropathy hand and legs  . Weight loss, unintentional   . Adenomatous polyp of colon 10/2010  . Diverticula, colon   . Heart murmur   . CHF (congestive heart failure)     Past Surgical History  Procedure Laterality Date  . Kidney transplant  1996  . Back surgery    . Cervical fusion    . Av fistula placement      for dialysis  . Cholecystectomy  12/02/2012    Procedure: LAPAROSCOPIC CHOLECYSTECTOMY WITH INTRAOPERATIVE CHOLANGIOGRAM;  Surgeon: Edward Jolly, MD;  Location: [facility] OR;  Service: General;  Laterality: N/A;    Family History  Problem Relation Age of Onset  . Colon cancer Brother   . Coronary artery disease Mother 30    History  Substance Use Topics  . Smoking status:  Former Smoker    Types: Cigarettes    Quit date: 12/31/1991  . Smokeless tobacco: Never Used  . Alcohol Use: No    OB History   Grav Para Term Preterm Abortions TAB SAB Ect Mult Living                  Review of Systems  Constitutional: Positive for fatigue. Negative for activity change.  HENT: Negative for facial swelling and neck pain.   Respiratory: Positive for shortness of breath. Negative for cough, wheezing and stridor.   Cardiovascular: Negative for chest pain.  Gastrointestinal: Negative for nausea, vomiting, abdominal pain, diarrhea, constipation, blood in stool and abdominal distention.  Genitourinary: Negative for hematuria and difficulty urinating.  Skin: Negative for color change.  Neurological: Positive for weakness. Negative for speech difficulty.  Hematological: Does not bruise/bleed easily.  Psychiatric/Behavioral: Negative for confusion.    Allergies  Sulfa antibiotics  Home Medications   Current Outpatient Rx  Name  Route  Sig  Dispense  Refill  . allopurinol (ZYLOPRIM) 300 MG tablet   Oral   Take 150 mg by mouth at bedtime.         . calcium carbonate (TUMS - DOSED IN MG ELEMENTAL CALCIUM) 500 MG chewable tablet   Oral   Chew 1 tablet by mouth 3 (three) times daily as  needed. For acid         . doxazosin (CARDURA) 8 MG tablet   Oral   Take 8 mg by mouth 2 (two) times daily.           Marland Kitchen labetalol (NORMODYNE) 300 MG tablet   Oral   Take 300 mg by mouth 2 (two) times daily.         Marland Kitchen lisinopril (PRINIVIL,ZESTRIL) 10 MG tablet   Oral   Take 10 mg by mouth 4 (four) times a week. Take on non dialysis days Sunday, Monday, Wednesday and Friday         . simvastatin (ZOCOR) 20 MG tablet   Oral   Take 20 mg by mouth every evening.         . sorbitol 70 % SOLN   Oral   Take 30 mLs by mouth daily as needed. For constipation         . zolpidem (AMBIEN) 5 MG tablet   Oral   Take 5 mg by mouth at bedtime as needed. For insomnia            BP 175/95  Pulse 73  Temp(Src) 98 F (36.7 C) (Oral)  Resp 14  SpO2 100%  Physical Exam  Nursing note and vitals reviewed. Constitutional: She is oriented to person, place, and time. She appears well-developed and well-nourished.  HENT:  Head: Normocephalic and atraumatic.  Eyes: EOM are normal. Pupils are equal, round, and reactive to light.  Neck: Neck supple. JVD present.  Cardiovascular: Normal rate and regular rhythm.   Murmur heard. Pulmonary/Chest: Effort normal. No respiratory distress. She has no wheezes. She has no rales.  Abdominal: Soft. She exhibits no distension. There is no tenderness. There is no rebound and no guarding.  Neurological: She is alert and oriented to person, place, and time.  Skin: Skin is warm and dry.    ED Course  Procedures (including critical care time)  Labs Reviewed  CBC WITH DIFFERENTIAL - Abnormal; Notable for the following:    RBC 3.67 (*)    Hemoglobin 9.4 (*)    HCT 30.5 (*)    MCH 25.6 (*)    RDW 18.7 (*)    Eosinophils Relative 9 (*)    All other components within normal limits  COMPREHENSIVE METABOLIC PANEL - Abnormal; Notable for the following:    Sodium 134 (*)    Chloride 92 (*)    BUN 31 (*)    Creatinine, Ser 5.17 (*)    Albumin 2.8 (*)    Alkaline Phosphatase 133 (*)    GFR calc non Af Amer 8 (*)    GFR calc Af Amer 9 (*)    All other components within normal limits  PRO B NATRIURETIC PEPTIDE - Abnormal; Notable for the following:    Pro B Natriuretic peptide (BNP) 56409.0 (*)    All other components within normal limits   Dg Chest 2 View  03/31/2013  *RADIOLOGY REPORT*  Clinical Data: Shortness of breath, CHF  CHEST - 2 VIEW  Comparison: 12/19/2012  Findings: Chronic interstitial markings.   No frank interstitial edema.  Suspected small right pleural effusion.  No pneumothorax.  Cardiomegaly.  Degenerative changes of the visualized thoracolumbar spine.  IMPRESSION: Cardiomegaly.  No frank interstitial edema.   Suspected small right pleural effusion.   Original Report Authenticated By: Julian Hy, M.D.      No diagnosis found.    MDM   Date: 04/01/2013  Rate: 74  Rhythm: normal sinus rhythm  QRS Axis: normal  Intervals: normal  ST/T Wave abnormalities: nonspecific ST/T changes  Conduction Disutrbances:none  Narrative Interpretation:   Old EKG Reviewed: changes noted - new t wave inversion v2, avl  Differential diagnosis includes: ACS syndrome CHF exacerbation Valvular disorder Myocarditis Pericarditis Pericardial effusion Pneumonia Pleural effusion Pulmonary edema PE Anemia Musculoskeletal pain  Pt comes in with cc of chest pain. Pt has ESRD on HD, diastolic CHF. No known MI. Pt had a surgery in December, but truly no risk factors for PE. Pt's lung exam was clear, she is slightly tachypenic.  Will get a dimer as a screen - i am assuming it is going to be elevated. Will r/o PE with dimer or CT.  Other possibility is that this dib is angina equivalent.     Varney Biles, MD 04/02/13 [id number]

## 2013-03-31 NOTE — ED Notes (Signed)
Pt reports SOB since this am. States that she is to be dialyzed Saturday am. Pt states that she is anuric and cannot take Lasix. Pt states that she did eat something salty today and may have caused her to retain fluid. Pt has an A/V fistula graft in left forearm.

## 2013-03-31 NOTE — ED Notes (Signed)
Pt st's she started feeling short of breath tonight.  St's she feels bloated. Pt is on dialysis, last dialysis on Thurs.  Pt also c/o headache off and on today

## 2013-04-01 ENCOUNTER — Encounter (HOSPITAL_COMMUNITY): Payer: Self-pay | Admitting: Nephrology

## 2013-04-01 ENCOUNTER — Emergency Department (HOSPITAL_COMMUNITY): Payer: Medicare Other

## 2013-04-01 DIAGNOSIS — J811 Chronic pulmonary edema: Secondary | ICD-10-CM | POA: Diagnosis not present

## 2013-04-01 DIAGNOSIS — E8770 Fluid overload, unspecified: Secondary | ICD-10-CM

## 2013-04-01 DIAGNOSIS — E8779 Other fluid overload: Secondary | ICD-10-CM | POA: Diagnosis not present

## 2013-04-01 DIAGNOSIS — N186 End stage renal disease: Secondary | ICD-10-CM

## 2013-04-01 DIAGNOSIS — I10 Essential (primary) hypertension: Secondary | ICD-10-CM

## 2013-04-01 DIAGNOSIS — R0602 Shortness of breath: Secondary | ICD-10-CM | POA: Diagnosis not present

## 2013-04-01 DIAGNOSIS — J810 Acute pulmonary edema: Secondary | ICD-10-CM

## 2013-04-01 LAB — POCT I-STAT TROPONIN I: Troponin i, poc: 0 ng/mL (ref 0.00–0.08)

## 2013-04-01 LAB — TROPONIN I
Troponin I: <0.3 ng/mL (ref <0.30)
Troponin I: <0.3 ng/mL (ref <0.30)
Troponin I: <0.3 ng/mL (ref <0.30)

## 2013-04-01 MED ORDER — PENTAFLUOROPROP-TETRAFLUOROETH EX AERO: 1.0000 "application " | INHALATION_SPRAY | CUTANEOUS | Status: DC | PRN

## 2013-04-01 MED ORDER — SODIUM CHLORIDE 0.9 % IJ SOLN
3.0000 mL | Freq: Two times a day (BID) | INTRAMUSCULAR | Status: DC
  Administered 2013-04-01 – 2013-04-02 (×2): 3 mL via INTRAVENOUS

## 2013-04-01 MED ORDER — DARBEPOETIN ALFA-POLYSORBATE 150 MCG/0.3ML IJ SOLN
150.0000 ug | INTRAMUSCULAR | Status: DC
  Filled 2013-04-01: qty 0.3

## 2013-04-01 MED ORDER — SIMVASTATIN 20 MG PO TABS
20.0000 mg | ORAL_TABLET | Freq: Every day | ORAL | Status: DC
  Administered 2013-04-01: 20 mg via ORAL
  Filled 2013-04-01 (×2): qty 1

## 2013-04-01 MED ORDER — HEPARIN SODIUM (PORCINE) 1000 UNIT/ML DIALYSIS: 1000.0000 [IU] | INTRAMUSCULAR | Status: DC | PRN

## 2013-04-01 MED ORDER — DOXERCALCIFEROL 4 MCG/2ML IV SOLN
4.0000 ug | INTRAVENOUS | Status: DC
  Filled 2013-04-01: qty 2

## 2013-04-01 MED ORDER — DARBEPOETIN ALFA-POLYSORBATE 150 MCG/0.3ML IJ SOLN
INTRAMUSCULAR | Status: AC
  Administered 2013-04-01: 150 ug via INTRAVENOUS
  Filled 2013-04-01: qty 0.3

## 2013-04-01 MED ORDER — HEPARIN SODIUM (PORCINE) 1000 UNIT/ML DIALYSIS
20.0000 [IU]/kg | INTRAMUSCULAR | Status: DC | PRN
  Administered 2013-04-01: 1200 [IU] via INTRAVENOUS_CENTRAL

## 2013-04-01 MED ORDER — ASPIRIN EC 81 MG PO TBEC
81.0000 mg | DELAYED_RELEASE_TABLET | Freq: Every day | ORAL | Status: DC
  Administered 2013-04-01 – 2013-04-02 (×2): 81 mg via ORAL
  Filled 2013-04-01 (×2): qty 1

## 2013-04-01 MED ORDER — LIDOCAINE HCL (PF) 1 % IJ SOLN: 5.0000 mL | INTRAMUSCULAR | Status: DC | PRN

## 2013-04-01 MED ORDER — SODIUM CHLORIDE 0.9 % IV SOLN: 100.0000 mL | INTRAVENOUS | Status: DC | PRN

## 2013-04-01 MED ORDER — HEPARIN SODIUM (PORCINE) 5000 UNIT/ML IJ SOLN
5000.0000 [IU] | Freq: Three times a day (TID) | INTRAMUSCULAR | Status: DC
  Administered 2013-04-01 – 2013-04-02 (×5): 5000 [IU] via SUBCUTANEOUS
  Filled 2013-04-01 (×7): qty 1

## 2013-04-01 MED ORDER — LISINOPRIL 10 MG PO TABS
10.0000 mg | ORAL_TABLET | ORAL | Status: DC
  Filled 2013-04-01 (×2): qty 1

## 2013-04-01 MED ORDER — ALTEPLASE 2 MG IJ SOLR
2.0000 mg | Freq: Once | INTRAMUSCULAR | Status: DC | PRN
  Filled 2013-04-01: qty 2

## 2013-04-01 MED ORDER — LIDOCAINE-PRILOCAINE 2.5-2.5 % EX CREA
1.0000 "application " | TOPICAL_CREAM | CUTANEOUS | Status: DC | PRN
  Filled 2013-04-01: qty 5

## 2013-04-01 MED ORDER — NEPRO/CARBSTEADY PO LIQD
237.0000 mL | Freq: Two times a day (BID) | ORAL | Status: DC
  Administered 2013-04-01 – 2013-04-02 (×3): 237 mL via ORAL

## 2013-04-01 MED ORDER — ZOLPIDEM TARTRATE 5 MG PO TABS
5.0000 mg | ORAL_TABLET | Freq: Every evening | ORAL | Status: DC | PRN
  Administered 2013-04-01: 5 mg via ORAL
  Filled 2013-04-01: qty 1

## 2013-04-01 MED ORDER — ALLOPURINOL 150 MG HALF TABLET
150.0000 mg | ORAL_TABLET | Freq: Every day | ORAL | Status: DC
  Administered 2013-04-01: 150 mg via ORAL
  Filled 2013-04-01 (×2): qty 1

## 2013-04-01 MED ORDER — CALCIUM CARBONATE ANTACID 500 MG PO CHEW
1.0000 | CHEWABLE_TABLET | Freq: Three times a day (TID) | ORAL | Status: DC | PRN
  Administered 2013-04-01: 200 mg via ORAL
  Filled 2013-04-01: qty 1

## 2013-04-01 MED ORDER — DOXAZOSIN MESYLATE 8 MG PO TABS
8.0000 mg | ORAL_TABLET | Freq: Two times a day (BID) | ORAL | Status: DC
  Administered 2013-04-01 – 2013-04-02 (×2): 8 mg via ORAL
  Filled 2013-04-01 (×4): qty 1

## 2013-04-01 MED ORDER — RENA-VITE PO TABS
1.0000 | ORAL_TABLET | Freq: Every day | ORAL | Status: DC
  Administered 2013-04-02: 1 via ORAL
  Filled 2013-04-01 (×2): qty 1

## 2013-04-01 MED ORDER — LISINOPRIL 10 MG PO TABS
10.0000 mg | ORAL_TABLET | ORAL | Status: DC
  Administered 2013-04-02: 10 mg via ORAL
  Filled 2013-04-01: qty 1

## 2013-04-01 MED ORDER — IOHEXOL 350 MG/ML SOLN
100.0000 mL | Freq: Once | INTRAVENOUS | Status: AC | PRN
Start: 2013-04-01 — End: 2013-04-01
  Administered 2013-04-01: 100 mL via INTRAVENOUS

## 2013-04-01 MED ORDER — DOXERCALCIFEROL 4 MCG/2ML IV SOLN
INTRAVENOUS | Status: AC
  Administered 2013-04-01: 4 ug via INTRAVENOUS
  Filled 2013-04-01: qty 2

## 2013-04-01 MED ORDER — NEPRO/CARBSTEADY PO LIQD
237.0000 mL | ORAL | Status: DC | PRN
  Filled 2013-04-01: qty 237

## 2013-04-01 MED ORDER — LABETALOL HCL 300 MG PO TABS
300.0000 mg | ORAL_TABLET | Freq: Two times a day (BID) | ORAL | Status: DC
  Administered 2013-04-01 – 2013-04-02 (×2): 300 mg via ORAL
  Filled 2013-04-01 (×4): qty 1

## 2013-04-01 NOTE — ED Notes (Signed)
Patient transported to CT 

## 2013-04-01 NOTE — Consult Note (Addendum)
[facility] Renal Consultation Note    Indication for Consultation:  Management of ESRD/hemodialysis; anemia, hypertension/volume and secondary hyperparathyroidism  HPI: [name] is a 62 y.o. 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  She has been getting to her EDW and feels like she is losing weight due to lack of appetite, especially since cholecystectomy in December 2013.  She's not had fever or diarrhea. She's prone to constipation which she treats with sorbitol. She is anuric. She craves ice. She is due for dialysis today.  Past Medical History  Diagnosis Date  . Anxiety   . Blood transfusion   . Depression   . GERD (gastroesophageal reflux disease)   . Hyperlipidemia   . Hypertension   . Neuromuscular disorder     neuropathy hand and legs  . Weight loss, unintentional   . Adenomatous polyp of colon 10/2010  . Diverticula, colon   . Heart murmur   . CHF (congestive heart failure)   . 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  Had deceased donor kidney transplant in 1996.  Had some early rejection then stable function for years, then had slow decline of function and went back on hemodialysis in 2012.  Gets HD TTS schedule at [facility] still using L forearm AVF.     Marland Kitchen Anemia in CKD (chronic kidney disease) 11/07/2012   Past Surgical History  Procedure Laterality Date  . Kidney transplant  1996  . Back surgery    . Cervical fusion    . Av fistula placement      for dialysis  . Cholecystectomy  12/02/2012    Procedure: LAPAROSCOPIC CHOLECYSTECTOMY WITH INTRAOPERATIVE CHOLANGIOGRAM;  Surgeon: Edward Jolly, MD;  Location: [facility] OR;  Service: General;  Laterality: N/A;   Family History  Problem Relation Age of Onset  . Colon cancer Brother    . Coronary artery disease Mother 11   Social History:  reports that she quit smoking about 21 years ago. Her smoking use included Cigarettes. She smoked 0.00 packs per day. She has never used smokeless tobacco. She reports that she does not drink alcohol or use illicit drugs. Allergies  Allergen Reactions  . Sulfa Antibiotics     Both parents allergic-so will not take   Prior to Admission medications   Medication Sig Start Date End Date Taking? Authorizing Provider  allopurinol (ZYLOPRIM) 300 MG tablet Take 150 mg by mouth at bedtime.   Yes Historical Provider, MD  calcium carbonate (TUMS - DOSED IN MG ELEMENTAL CALCIUM) 500 MG chewable tablet Chew 1 tablet by mouth 3 (three) times daily as needed. For acid   Yes Historical Provider, MD  doxazosin (CARDURA) 8 MG tablet Take 8 mg by mouth 2 (two) times daily.     Yes Historical Provider, MD  labetalol (NORMODYNE) 300 MG tablet Take 300 mg by mouth 2 (two) times daily. 11/09/12  Yes Velvet Bathe, MD  lisinopril (PRINIVIL,ZESTRIL) 10 MG tablet Take 10 mg by mouth 4 (four) times a week. Take on non dialysis days Sunday, Monday, Wednesday and Friday 12/04/12  Yes Nita Sells, MD  simvastatin (ZOCOR) 20 MG tablet Take 20 mg by mouth every evening.   Yes Historical Provider, MD  sorbitol 70 % SOLN Take 30 mLs by mouth daily as needed.  For constipation 12/04/12  Yes Nita Sells, MD  zolpidem (AMBIEN) 5 MG tablet Take 5 mg by mouth at bedtime as needed. For insomnia   Yes Historical Provider, MD   Current Facility-Administered Medications  Medication Dose Route Frequency Provider Last Rate Last Dose  . allopurinol (ZYLOPRIM) tablet 150 mg  150 mg Oral QHS Etta Quill, DO      . aspirin EC tablet 81 mg  81 mg Oral Daily Etta Quill, DO      . calcium carbonate (TUMS - dosed in mg elemental calcium) chewable tablet 200 mg of elemental calcium  1 tablet Oral TID PRN Etta Quill, DO      . darbepoetin (ARANESP) injection  150 mcg  150 mcg Intravenous Q Sat-HD Myriam Jacobson, PA-C      . doxazosin (CARDURA) tablet 8 mg  8 mg Oral BID Etta Quill, DO      . doxercalciferol (HECTOROL) injection 4 mcg  4 mcg Intravenous Q T,Th,Sa-HD Myriam Jacobson, PA-C      . feeding supplement (NEPRO CARB STEADY) liquid 237 mL  237 mL Oral BID BM Myriam Jacobson, PA-C   237 mL at 04/01/13 1303  . heparin injection 5,000 Units  5,000 Units Subcutaneous Q8H Etta Quill, DO   5,000 Units at 04/01/13 1302  . labetalol (NORMODYNE) tablet 300 mg  300 mg Oral BID Etta Quill, DO      . lisinopril (PRINIVIL,ZESTRIL) tablet 10 mg  10 mg Oral 4 times weekly Etta Quill, DO      . multivitamin (RENA-VIT) tablet 1 tablet  1 tablet Oral Daily Myriam Jacobson, PA-C      . simvastatin (ZOCOR) tablet 20 mg  20 mg Oral QHS Etta Quill, DO      . sodium chloride 0.9 % injection 3 mL  3 mL Intravenous Q12H Etta Quill, DO      . zolpidem (AMBIEN) tablet 5 mg  5 mg Oral QHS PRN Etta Quill, DO       Labs: Basic Metabolic Panel:  Recent Labs Lab 03/31/13 1957  NA 134*  K 4.2  CL 92*  CO2 30  GLUCOSE 78  BUN 31*  CREATININE 5.17*  CALCIUM 8.7   Liver Function Tests:  Recent Labs Lab 03/31/13 1957  AST 24  ALT 9  ALKPHOS 133*  BILITOT 0.5  PROT 7.1  ALBUMIN 2.8*  CBC:  Recent Labs Lab 03/31/13 1957  WBC 6.8  NEUTROABS 3.5  HGB 9.4*  HCT 30.5*  MCV 83.1  PLT 254   Cardiac Enzymes:  Recent Labs Lab 04/01/13 0620 04/01/13 1235  TROPONINI <0.30 <0.30  Studies/Results: Dg Chest 2 View  03/31/2013  *RADIOLOGY REPORT*  Clinical Data: Shortness of breath, CHF  CHEST - 2 VIEW  Comparison: 12/19/2012  Findings: Chronic interstitial markings.   No frank interstitial edema.  Suspected small right pleural effusion.  No pneumothorax.  Cardiomegaly.  Degenerative changes of the visualized thoracolumbar spine.  IMPRESSION: Cardiomegaly.  No frank interstitial edema.  Suspected small right  pleural effusion.   Original Report Authenticated By: Julian Hy, M.D.    Ct Angio Chest Pe W/cm &/or Wo Cm  04/01/2013  *RADIOLOGY REPORT*  Clinical Data: Shortness of breath.  Patient on dialysis.  Concern for pulmonary embolus.  CT ANGIOGRAPHY CHEST  Technique:  Multidetector CT imaging of the chest using the standard protocol during bolus administration of intravenous contrast. Multiplanar reconstructed images including  MIPs were obtained and reviewed to evaluate the vascular anatomy.  Contrast: 137mL OMNIPAQUE IOHEXOL 350 MG/ML SOLN  Comparison: Chest radiograph performed 03/31/2013, and CT of the chest performed 05/17/2008  Findings: There is no evidence of pulmonary embolus.  There is mild interstitial prominence at the lower lung lobes and at the lung apices, with scattered nonspecific small peripheral nodular densities seen in both lungs.  Much of this likely reflects mild interstitial edema, though a superimposed infectious process cannot be entirely excluded.  There is no evidence of pleural effusion or pneumothorax.  The heart is mildly enlarged.  Trace pericardial fluid remains within normal limits.  No definite mediastinal lymphadenopathy is seen.  Diffuse coronary artery calcifications are noted.  The great vessels are somewhat tortuous but grossly unremarkable in appearance, though difficult to fully assess due to the phase of contrast enhancement.  Scattered calcification is noted along the aortic arch and descending thoracic aorta.  No axillary lymphadenopathy is seen.  The thyroid gland is not well assessed.  The visualized portions of the liver and spleen are unremarkable. Cysts are seen about the markedly atrophic left kidney.  No acute osseous abnormalities are seen.  There is chronic deformity of the left posterolateral seventh rib.  There appears to be chronic partial dislocation of the left sternoclavicular joint, though much of this may reflect degenerative change; this is  unchanged from 2009.  IMPRESSION:  1.  No evidence of pulmonary embolus. 2.  Mild interstitial prominence within the lower lung lobes and at the lung apices, with scattered nonspecific small peripheral nodular densities in both lungs.  Much of this likely reflects mild interstitial edema, though superimposed infectious process cannot be entirely excluded. 3.  Mild cardiomegaly. 4.  Diffuse coronary artery calcifications seen.  5.  Left renal atrophy, with associated cysts.  The right kidney is not imaged on this study. 6.  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   Original Report Authenticated By: Santa Lighter, M.D.    ROS: As per HPI , otherwise, negative  Physical Exam: Filed Vitals:   04/01/13 [id number] 04/01/13 0728 04/01/13 0826 04/01/13 1439  BP: 154/82 159/88 166/102 155/80  Pulse: 75   75  Temp:   97.8 F (36.6 C) 97.4 F (36.3 C)  TempSrc:   Oral Oral  Resp: 14 16 17 18   Height:   5\' 2"  (1.575 m)   Weight:   57.6 kg (126 lb 15.8 oz)   SpO2: 99% 100% 100% 100%     General: Well developed, slender in no acute distress on room air Head: Normocephalic, atraumatic, sclera non-icteric, mucus membranes are moist Neck: Supple. Neck veins prominent Lungs: Rales right base Breathing is unlabored. Heart: RRR with S1 S2. No murmurs, rubs, or gallops appreciated. Abdomen: Soft, non-tender, non-distended with normoactive bowel sounds.  M-S:  Strength and tone appear normal for age. Lower extremities:without edema or ischemic changes, no open wounds  Neuro: Alert and oriented X 3. Moves all extremities spontaneously. Psych:  Responds to questions appropriately  Dialysis Access: left upper AVF + bruit  Dialysis Orders: [facility]  on TTS Optiflux 180 . EDW 56kg HD Bath 2K 2Ca   Time 4 Heparin 3000. Access left upper AVF BFR 350 DFRA 1.5    hectorol 4 mcg IV/HD Epogen 17000  Units IV/HD   Venofer  none Recent labs: Hgb 9.3 17% ferritin 1952 iPTH 372  Assessment/Plan: 1.  SOB - mild fluid on radiology studies;CTangio - no PE; BNP ^ 56K - goal 3 L on HD today and see if this relieves some of symptoms.; work up per primary; intial enzymes negative. EKG NSR Only 2kg over dry weight but +JVD, headaches, high BP not coming down with outpt HD recently, likely has lost weight. Max UF with HD today. +ground glass changes of edema, and thickened septae on CT chest 2.  ESRD -  TTS - HD per routine orders 3.  Hypertension/volume  - continue current meds plus volume control - gets to EDW with BP still ^, likely needs EDW lowered 4.  Anemia  - continue ESA; she is not on IV Fe due to high ferritin, but pica suggests she IS Fe deficient and ^ ferritin may be more inflammatory in nature 5.  Metabolic bone disease -  Continue hectorol and binders 6.  Nutrition - renal diet; add supplements - add multivitamin  Myriam Jacobson, PA-C [facility] Beeper [phone] 04/01/2013, 2:50 PM   Patient seen and examined.  I agree with plan as above with additions as indicated. Kelly Splinter  MD [phone] pgr    [phone] cell 04/01/2013, 2:50 PM

## 2013-04-01 NOTE — H&P (Signed)
[facility] History and Physical  [name] [id number] DOB: 1951-07-16 DOA: 03/31/2013  Referring physician: ED PCP: Estanislado Emms, MD  Specialists: Olene Floss, [place] cards  Chief Complaint: SOB  HPI: [name] is a 62 y.o. female h/o ESRD (hd TTS), last session Thurs, also has diastolic CHF, IDDM, comes in with c/o SOB.  SOB onset earlier yesterday, no associated chest pain, patient also has no nausea, cough, fevers.  She states she thinks that she "ate something salty" and so may be fluid overloaded.  In the ED CTA to r/o PE didn't show a PE, showed mild pulmonary edema.  She has no h/o CAD despite having had ESRD for at least 18 years or more (kidney transplant in 1996).  Her last stress test was Jan 2013, ED wants the patient admitted for dialysis and as a r/o cardiac since the SOB could be her anginal equivalent.  Review of Systems: 12 systems reviewed and otherwise negative.  Past Medical History  Diagnosis Date  . Anemia   . Anxiety   . Blood transfusion   . Depression   . GERD (gastroesophageal reflux disease)   . Hyperlipidemia   . Hypertension   . Chronic kidney disease     transplant 1996  . Neuromuscular disorder     neuropathy hand and legs  . Weight loss, unintentional   . Adenomatous polyp of colon 10/2010  . Diverticula, colon   . Heart murmur   . CHF (congestive heart failure)    Past Surgical History  Procedure Laterality Date  . Kidney transplant  1996  . Back surgery    . Cervical fusion    . Av fistula placement      for dialysis  . Cholecystectomy  12/02/2012    Procedure: LAPAROSCOPIC CHOLECYSTECTOMY WITH INTRAOPERATIVE CHOLANGIOGRAM;  Surgeon: Edward Jolly, MD;  Location: [facility];  Service: General;  Laterality: N/A;   Social History:  reports that she quit smoking about 21 years ago. Her smoking use included Cigarettes. She smoked 0.00 packs per day. She has never used smokeless tobacco. She reports that she  does not drink alcohol or use illicit drugs.   Allergies  Allergen Reactions  . Sulfa Antibiotics     Both parents allergic-so will not take    Family History  Problem Relation Age of Onset  . Colon cancer Brother   . Coronary artery disease Mother 57    Prior to Admission medications   Medication Sig Start Date End Date Taking? Authorizing Provider  allopurinol (ZYLOPRIM) 300 MG tablet Take 150 mg by mouth at bedtime.   Yes Historical Provider, MD  calcium carbonate (TUMS - DOSED IN MG ELEMENTAL CALCIUM) 500 MG chewable tablet Chew 1 tablet by mouth 3 (three) times daily as needed. For acid   Yes Historical Provider, MD  doxazosin (CARDURA) 8 MG tablet Take 8 mg by mouth 2 (two) times daily.     Yes Historical Provider, MD  labetalol (NORMODYNE) 300 MG tablet Take 300 mg by mouth 2 (two) times daily. 11/09/12  Yes Velvet Bathe, MD  lisinopril (PRINIVIL,ZESTRIL) 10 MG tablet Take 10 mg by mouth 4 (four) times a week. Take on non dialysis days Sunday, Monday, Wednesday and Friday 12/04/12  Yes Nita Sells, MD  simvastatin (ZOCOR) 20 MG tablet Take 20 mg by mouth every evening.   Yes Historical Provider, MD  sorbitol 70 % SOLN Take 30 mLs by mouth daily as needed. For constipation 12/04/12  Yes Nita Sells,  MD  zolpidem (AMBIEN) 5 MG tablet Take 5 mg by mouth at bedtime as needed. For insomnia   Yes Historical Provider, MD   Physical Exam: Filed Vitals:   04/01/13 [id number] 04/01/13 0320 04/01/13 0328 04/01/13 0400  BP:    170/91  Pulse: 75 75 75 74  Temp:      TempSrc:      Resp: 11 10 16 14   SpO2: 98% 99% 99% 97%    General:  NAD, resting comfortably in bed Eyes: PEERLA EOMI ENT: mucous membranes moist Neck: supple JVD is present Cardiovascular: RRR w/o MRG  Respiratory: CTA B, no rhonchi or crackles heard Abdomen: soft, nt, nd, bs+ Skin: no rash nor lesion Musculoskeletal: MAE, full ROM all 4 extremities Psychiatric: normal tone and affect Neurologic: AAOx3,  grossly non-focal  Labs on Admission:  Basic Metabolic Panel:  Recent Labs Lab 03/31/13 1957  NA 134*  K 4.2  CL 92*  CO2 30  GLUCOSE 78  BUN 31*  CREATININE 5.17*  CALCIUM 8.7   Liver Function Tests:  Recent Labs Lab 03/31/13 1957  AST 24  ALT 9  ALKPHOS 133*  BILITOT 0.5  PROT 7.1  ALBUMIN 2.8*   No results found for this basename: LIPASE, AMYLASE,  in the last 168 hours No results found for this basename: AMMONIA,  in the last 168 hours CBC:  Recent Labs Lab 03/31/13 1957  WBC 6.8  NEUTROABS 3.5  HGB 9.4*  HCT 30.5*  MCV 83.1  PLT 254   Cardiac Enzymes: No results found for this basename: CKTOTAL, CKMB, CKMBINDEX, TROPONINI,  in the last 168 hours  BNP (last 3 results)  Recent Labs  11/28/12 1237 12/19/12 0256 03/31/13 1957  PROBNP 35557.0* 31752.0* 56409.0*   CBG: No results found for this basename: GLUCAP,  in the last 168 hours  Radiological Exams on Admission: Dg Chest 2 View  03/31/2013  *RADIOLOGY REPORT*  Clinical Data: Shortness of breath, CHF  CHEST - 2 VIEW  Comparison: 12/19/2012  Findings: Chronic interstitial markings.   No frank interstitial edema.  Suspected small right pleural effusion.  No pneumothorax.  Cardiomegaly.  Degenerative changes of the visualized thoracolumbar spine.  IMPRESSION: Cardiomegaly.  No frank interstitial edema.  Suspected small right pleural effusion.   Original Report Authenticated By: Julian Hy, M.D.    Ct Angio Chest Pe W/cm &/or Wo Cm  04/01/2013  *RADIOLOGY REPORT*  Clinical Data: Shortness of breath.  Patient on dialysis.  Concern for pulmonary embolus.  CT ANGIOGRAPHY CHEST  Technique:  Multidetector CT imaging of the chest using the standard protocol during bolus administration of intravenous contrast. Multiplanar reconstructed images including MIPs were obtained and reviewed to evaluate the vascular anatomy.  Contrast: 170mL OMNIPAQUE IOHEXOL 350 MG/ML SOLN  Comparison: Chest radiograph performed  03/31/2013, and CT of the chest performed 05/17/2008  Findings: There is no evidence of pulmonary embolus.  There is mild interstitial prominence at the lower lung lobes and at the lung apices, with scattered nonspecific small peripheral nodular densities seen in both lungs.  Much of this likely reflects mild interstitial edema, though a superimposed infectious process cannot be entirely excluded.  There is no evidence of pleural effusion or pneumothorax.  The heart is mildly enlarged.  Trace pericardial fluid remains within normal limits.  No definite mediastinal lymphadenopathy is seen.  Diffuse coronary artery calcifications are noted.  The great vessels are somewhat tortuous but grossly unremarkable in appearance, though difficult to fully assess due to the phase  of contrast enhancement.  Scattered calcification is noted along the aortic arch and descending thoracic aorta.  No axillary lymphadenopathy is seen.  The thyroid gland is not well assessed.  The visualized portions of the liver and spleen are unremarkable. Cysts are seen about the markedly atrophic left kidney.  No acute osseous abnormalities are seen.  There is chronic deformity of the left posterolateral seventh rib.  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  IMPRESSION:  1.  No evidence of pulmonary embolus. 2.  Mild interstitial prominence within the lower lung lobes and at the lung apices, with scattered nonspecific small peripheral nodular densities in both lungs.  Much of this likely reflects mild interstitial edema, though superimposed infectious process cannot be entirely excluded. 3.  Mild cardiomegaly. 4.  Diffuse coronary artery calcifications seen.  5.  Left renal atrophy, with associated cysts.  The right kidney is not imaged on this study. 6.  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   Original Report Authenticated By: Santa Lighter, M.D.     EKG: Independently reviewed.  Does appear to be a new T wave inversion in V2 and AVL not on prior EKG.  Assessment/Plan Principal Problem:   Shortness of breath Active Problems:   ESRD (end stage renal disease)   1. Shortness of breath - while she does have some pulmonary edema, she is certainly at risk for CAD given her history and there do appear to be T wave inversion in lead V2 and possibly AVL with ST segment depression that wasn't there on Jan 6th, 2014.  Admitting patient, certainly plan routine dialysis today, but may also need cardiac evaluation (stress test) given the EKG findings and relatively unimpressive findings for fluid overload at this time.  2d echo ordered, serial troponins ordered, keeping her NPO except ice chips in case they want to do a stress test. 2. ESRD - have put in voicemail to the dialysis consult line for routine dialysis today. 3. HTN - continue home meds    Code Status: Full Code (must indicate code status--if unknown or must be presumed, indicate so) Family Communication: No family in room (indicate person spoken with, if applicable, with phone number if by telephone) Disposition Plan: Admit to inpatient (indicate anticipated LOS)  Time spent: 50 min  GARDNER, JARED M. [facility] Pager [phone]  If 7PM-7AM, please contact night-coverage [email or web address] Password [facility] 04/01/2013, 6:24 AM

## 2013-04-01 NOTE — Progress Notes (Signed)
Patient admitted early this AM by Dr. Alcario Drought.  Last stress test 12/13- by Hochrien.  Will see if dialysis helps with SOB, cycel CE and get EKG in aM  [name]

## 2013-04-01 NOTE — Progress Notes (Signed)
04/01/2013 patient transfer from emergency room to 6700 at 0835 She is alert, oriented and ambulatory. Patient did c/o of some weakness. Patient skin is dry, sacrum is fine. Patient was place on telemetry and in sinus rhythm. She had no c/o of pain. Patient did watch safety video, also does have left arm fistula which is positive. [name] Insurance claims handler.

## 2013-04-01 NOTE — Progress Notes (Signed)
04/01/2013 patient c/o of having diarrhea, per nurse tech patient did have loose stool at 1800.  Patient did have a small soft stool, before going to dialysis. Dr Eliseo Squires is aware and orders were given if patient had diarrhea to put in order for c-diff. First shift RN did report to the third shift nurse. Continue to monitor. [facility] RN.

## 2013-04-02 DIAGNOSIS — N186 End stage renal disease: Secondary | ICD-10-CM | POA: Diagnosis not present

## 2013-04-02 DIAGNOSIS — I10 Essential (primary) hypertension: Secondary | ICD-10-CM | POA: Diagnosis not present

## 2013-04-02 DIAGNOSIS — R0602 Shortness of breath: Secondary | ICD-10-CM | POA: Diagnosis not present

## 2013-04-02 DIAGNOSIS — I369 Nonrheumatic tricuspid valve disorder, unspecified: Secondary | ICD-10-CM

## 2013-04-02 DIAGNOSIS — I5030 Unspecified diastolic (congestive) heart failure: Secondary | ICD-10-CM | POA: Diagnosis not present

## 2013-04-02 DIAGNOSIS — E8779 Other fluid overload: Secondary | ICD-10-CM | POA: Diagnosis not present

## 2013-04-02 DIAGNOSIS — N2581 Secondary hyperparathyroidism of renal origin: Secondary | ICD-10-CM | POA: Diagnosis not present

## 2013-04-02 DIAGNOSIS — J811 Chronic pulmonary edema: Secondary | ICD-10-CM | POA: Diagnosis not present

## 2013-04-02 DIAGNOSIS — I120 Hypertensive chronic kidney disease with stage 5 chronic kidney disease or end stage renal disease: Secondary | ICD-10-CM | POA: Diagnosis not present

## 2013-04-02 LAB — BASIC METABOLIC PANEL
Calcium: 8.8 mg/dL (ref 8.4–10.5)
GFR calc non Af Amer: 12 mL/min — ABNORMAL LOW (ref >90)
Sodium: 136 mEq/L (ref 135–145)

## 2013-04-02 LAB — CBC
MCH: 26.2 pg (ref 26.0–34.0)
MCHC: 31.7 g/dL (ref 30.0–36.0)
Platelets: 267 10*3/uL (ref 150–400)
RDW: 18.7 % — ABNORMAL HIGH (ref 11.5–15.5)

## 2013-04-02 MED ORDER — NEPRO/CARBSTEADY PO LIQD: 237.0000 mL | Freq: Two times a day (BID) | ORAL | Status: DC

## 2013-04-02 MED ORDER — RENA-VITE PO TABS: 1.0000 | ORAL_TABLET | Freq: Every day | ORAL | Status: DC

## 2013-04-02 NOTE — Progress Notes (Signed)
  Echocardiogram 2D Echocardiogram has been performed.  Katrisha Segall 04/02/2013, 10:11 AM

## 2013-04-02 NOTE — Discharge Summary (Signed)
Physician Discharge Summary  [name] [id number] DOB: Nov 24, 1951 DOA: 03/31/2013  PCP: Estanislado Emms, MD  Admit date: 03/31/2013 Discharge date: 04/02/2013  Time spent: 35 minutes  Recommendations for Outpatient Follow-up:  1. HD per neurology  Discharge Diagnoses:  Principal Problem:   Shortness of breath Active Problems:   ESRD (end stage renal disease)   Volume overload   HTN (hypertension)   Discharge Condition: improved  Diet recommendation: heart healthy renal diet  Filed Weights   04/01/13 0826 04/01/13 1900 04/01/13 2243  Weight: 57.6 kg (126 lb 15.8 oz) 58.2 kg (128 lb 4.9 oz) 52.3 kg (115 lb 4.8 oz)    History of present illness:  [name] is a 62 y.o. female h/o ESRD (hd TTS), last session Thurs, also has diastolic CHF, IDDM, comes in with c/o SOB. SOB onset earlier yesterday, no associated chest pain, patient also has no nausea, cough, fevers. She states she thinks that she "ate something salty" and so may be fluid overloaded.  In the ED CTA to r/o PE didn't show a PE, showed mild pulmonary edema. She has no h/o CAD despite having had ESRD for at least 18 years or more (kidney transplant in 1996). Her last stress test was Jan 2013, ED wants the patient admitted for dialysis and as a r/o cardiac since the SOB could be her anginal equivalent.   Hospital Course:  1. SOB - mild fluid on radiology studies;CTangio - no PE; BNP ^ 56K - all troponins negative. EKG NSR UF 5 liters yesterday with post weight of 52.3 about 3.5 below prior EDW; 2 D Echo done and similar to previous; SOB resolved- follow up with cardiology as outaptient 2. ESRD - TTS - new EDW at d/c of 52 - continue to challenge and watch for further weight loss. 3. Hypertension/volume - continue current meds plus volume control - BP better with lower volume 4. Anemia - continue ESA; she is not on IV Fe due to high ferritin, but pica suggests she IS Fe deficient and ^ ferritin may be more  inflammatory in nature. Increase in Hgb today 10.8 due to decrease in volume 5. Metabolic bone disease - Continue hectorol and binders 6. Nutrition - renal diet; add supplements - add multivitamin  Procedures:  HD  Consultations:  renal  Discharge Exam: Filed Vitals:   04/01/13 2243 04/01/13 2300 04/02/13 0013 04/02/13 0513  BP: 134/81 134/81 131/69 109/69  Pulse: 79 77 76 76  Temp: 98.2 F (36.8 C)  99.3 F (37.4 C) 97.8 F (36.6 C)  TempSrc: Oral  Oral Oral  Resp: 13 19 16 16   Height:   5\' 2"  (1.575 m)   Weight: 52.3 kg (115 lb 4.8 oz)     SpO2: 98% 98% 94% 99%    General: A+Ox3, NAD Cardiovascular: rrr Respiratory: clear  Discharge Instructions  Discharge Orders   Future Orders Complete By Expires     Diet - low sodium heart healthy  As directed     Discharge instructions  As directed     Comments:      Avoid salty food- renal heart healthy diet continue with dialysis on T/Th/sat Follow up with cardiology in next few weeks    Increase activity slowly  As directed         Medication List    TAKE these medications       allopurinol 300 MG tablet  Commonly known as:  ZYLOPRIM  Take 150 mg by mouth at bedtime.  calcium carbonate 500 MG chewable tablet  Commonly known as:  TUMS - dosed in mg elemental calcium  Chew 1 tablet by mouth 3 (three) times daily as needed. For acid     doxazosin 8 MG tablet  Commonly known as:  CARDURA  Take 8 mg by mouth 2 (two) times daily.     feeding supplement (NEPRO CARB STEADY) Liqd  Take 237 mLs by mouth 2 (two) times daily between meals.     labetalol 300 MG tablet  Commonly known as:  NORMODYNE  Take 300 mg by mouth 2 (two) times daily.     lisinopril 10 MG tablet  Commonly known as:  PRINIVIL,ZESTRIL  Take 10 mg by mouth 4 (four) times a week. Take on non dialysis days Sunday, Monday, Wednesday and Friday     multivitamin Tabs tablet  Take 1 tablet by mouth daily.     simvastatin 20 MG tablet  Commonly  known as:  ZOCOR  Take 20 mg by mouth every evening.     sorbitol 70 % Soln  Take 30 mLs by mouth daily as needed. For constipation     zolpidem 5 MG tablet  Commonly known as:  AMBIEN  Take 5 mg by mouth at bedtime as needed. For insomnia          The results of significant diagnostics from this hospitalization (including imaging, microbiology, ancillary and laboratory) are listed below for reference.    Significant Diagnostic Studies: Dg Chest 2 View  03/31/2013  *RADIOLOGY REPORT*  Clinical Data: Shortness of breath, CHF  CHEST - 2 VIEW  Comparison: 12/19/2012  Findings: Chronic interstitial markings.   No frank interstitial edema.  Suspected small right pleural effusion.  No pneumothorax.  Cardiomegaly.  Degenerative changes of the visualized thoracolumbar spine.  IMPRESSION: Cardiomegaly.  No frank interstitial edema.  Suspected small right pleural effusion.   Original Report Authenticated By: Julian Hy, M.D.    Ct Angio Chest Pe W/cm &/or Wo Cm  04/01/2013  *RADIOLOGY REPORT*  Clinical Data: Shortness of breath.  Patient on dialysis.  Concern for pulmonary embolus.  CT ANGIOGRAPHY CHEST  Technique:  Multidetector CT imaging of the chest using the standard protocol during bolus administration of intravenous contrast. Multiplanar reconstructed images including MIPs were obtained and reviewed to evaluate the vascular anatomy.  Contrast: 140mL OMNIPAQUE IOHEXOL 350 MG/ML SOLN  Comparison: Chest radiograph performed 03/31/2013, and CT of the chest performed 05/17/2008  Findings: There is no evidence of pulmonary embolus.  There is mild interstitial prominence at the lower lung lobes and at the lung apices, with scattered nonspecific small peripheral nodular densities seen in both lungs.  Much of this likely reflects mild interstitial edema, though a superimposed infectious process cannot be entirely excluded.  There is no evidence of pleural effusion or pneumothorax.  The heart is  mildly enlarged.  Trace pericardial fluid remains within normal limits.  No definite mediastinal lymphadenopathy is seen.  Diffuse coronary artery calcifications are noted.  The great vessels are somewhat tortuous but grossly unremarkable in appearance, though difficult to fully assess due to the phase of contrast enhancement.  Scattered calcification is noted along the aortic arch and descending thoracic aorta.  No axillary lymphadenopathy is seen.  The thyroid gland is not well assessed.  The visualized portions of the liver and spleen are unremarkable. Cysts are seen about the markedly atrophic left kidney.  No acute osseous abnormalities are seen.  There is chronic deformity of the left posterolateral seventh  rib.  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  IMPRESSION:  1.  No evidence of pulmonary embolus. 2.  Mild interstitial prominence within the lower lung lobes and at the lung apices, with scattered nonspecific small peripheral nodular densities in both lungs.  Much of this likely reflects mild interstitial edema, though superimposed infectious process cannot be entirely excluded. 3.  Mild cardiomegaly. 4.  Diffuse coronary artery calcifications seen.  5.  Left renal atrophy, with associated cysts.  The right kidney is not imaged on this study. 6.  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   Original Report Authenticated By: Santa Lighter, M.D.     Microbiology: Recent Results (from the past 240 hour(s))  MRSA PCR SCREENING     Status: None   Collection Time    04/01/13 12:24 PM      Result Value Range Status   MRSA by PCR NEGATIVE  NEGATIVE Final   Comment:            The GeneXpert MRSA Assay (FDA     approved for NASAL specimens     only), is one component of a      comprehensive MRSA colonization     surveillance program. It is not     intended to diagnose MRSA     infection nor to guide or     monitor treatment for     MRSA infections.     Labs: Basic Metabolic Panel:  Recent Labs Lab 03/31/13 1957 04/02/13 0450  NA 134* 136  K 4.2 3.8  CL 92* 95*  CO2 30 30  GLUCOSE 78 89  BUN 31* 17  CREATININE 5.17* 3.73*  CALCIUM 8.7 8.8   Liver Function Tests:  Recent Labs Lab 03/31/13 1957  AST 24  ALT 9  ALKPHOS 133*  BILITOT 0.5  PROT 7.1  ALBUMIN 2.8*   No results found for this basename: LIPASE, AMYLASE,  in the last 168 hours No results found for this basename: AMMONIA,  in the last 168 hours CBC:  Recent Labs Lab 03/31/13 1957 04/02/13 0450  WBC 6.8 6.1  NEUTROABS 3.5  --   HGB 9.4* 10.8*  HCT 30.5* 34.1*  MCV 83.1 82.8  PLT 254 267   Cardiac Enzymes:  Recent Labs Lab 04/01/13 0620 04/01/13 1235 04/01/13 1745  TROPONINI <0.30 <0.30 <0.30   BNP: BNP (last 3 results)  Recent Labs  11/28/12 1237 12/19/12 0256 03/31/13 1957  PROBNP 35557.0* 31752.0* 56409.0*   CBG: No results found for this basename: GLUCAP,  in the last 168 hours     Signed:  Eliseo Squires, Nadeen Shipman  [facility] 04/02/2013, 1:12 PM

## 2013-04-02 NOTE — Progress Notes (Signed)
[facility] Progress Note  Subjective:   Feels much better!!  Wants to go home  Objective Filed Vitals:   04/01/13 2243 04/01/13 2300 04/02/13 0013 04/02/13 0513  BP: 134/81 134/81 131/69 109/69  Pulse: 79 77 76 76  Temp: 98.2 F (36.8 C)  99.3 F (37.4 C) 97.8 F (36.6 C)  TempSrc: Oral  Oral Oral  Resp: 13 19 16 16   Height:   5\' 2"  (1.575 m)   Weight: 52.3 kg (115 lb 4.8 oz)     SpO2: 98% 98% 94% 99%   Physical Exam General: good spirits, breathing easily Heart: RRR Lungs: good expansion, clear without rales Abdomen: soft Extremities: no LE edema Dialysis Access: left upper AVF + bruit   Dialysis Orders: [facility] .  EDW 56kg HD Bath 2K 2Ca Time 4 Heparin 3000. Access left upper AVF BFR 350 DFRA 1.5  hectorol 4 mcg IV/HD Epogen 17000 Units IV/HD Venofer none  Recent labs: Hgb 9.3 17% ferritin 1952 iPTH 372   Assessment/Plan:  1. SOB - mild fluid on radiology studies;CTangio - no PE; BNP ^ 56K - all troponins negative. EKG NSR UF 5 liters yesterday with post weight of 52.3 about 3.5 below prior EDW; 2 D Echo done; results pending; SOB resolved 2. ESRD - TTS - new EDW at d/c of 51 - continue to challenge and watch for further weight loss. 3. Hypertension/volume - continue current meds plus volume control - BP better with lower volume 4. Anemia - continue ESA; she is not on IV Fe due to high ferritin, but pica suggests she IS Fe deficient and ^ ferritin may be more inflammatory in nature. Increase in Hgb today 10.8 due to decrease in volume 5. Metabolic bone disease - Continue hectorol and binders 6. Nutrition - renal diet; add supplements - add multivitamin 7. Disp - ok for d/c today per renal  Myriam Jacobson, PA-C [place] [phone] 04/02/2013,10:12 AM  LOS: 2 days   Patient seen and examined.  Agree with assessment and plan as above. Much better with volume removal. Will lower dry weight, OK for  discharge from renal standpoint.  Kelly Splinter  MD [phone] pgr    [phone] cell 04/02/2013, 12:54 PM    Additional Objective Labs: Basic Metabolic Panel:  Recent Labs Lab 03/31/13 1957 04/02/13 0450  NA 134* 136  K 4.2 3.8  CL 92* 95*  CO2 30 30  GLUCOSE 78 89  BUN 31* 17  CREATININE 5.17* 3.73*  CALCIUM 8.7 8.8   Liver Function Tests:  Recent Labs Lab 03/31/13 1957  AST 24  ALT 9  ALKPHOS 133*  BILITOT 0.5  PROT 7.1  ALBUMIN 2.8*   CBC:  Recent Labs Lab 03/31/13 1957 04/02/13 0450  WBC 6.8 6.1  NEUTROABS 3.5  --   HGB 9.4* 10.8*  HCT 30.5* 34.1*  MCV 83.1 82.8  PLT 254 267  Cardiac Enzymes:  Recent Labs Lab 04/01/13 0620 04/01/13 1235 04/01/13 1745  TROPONINI <0.30 <0.30 <0.30   CDg Chest 2 View  03/31/2013  *RADIOLOGY REPORT*  Clinical Data: Shortness of breath, CHF  CHEST - 2 VIEW  Comparison: 12/19/2012  Findings: Chronic interstitial markings.   No frank interstitial edema.  Suspected small right pleural effusion.  No pneumothorax.  Cardiomegaly.  Degenerative changes of the visualized thoracolumbar spine.  IMPRESSION: Cardiomegaly.  No frank interstitial edema.  Suspected small right pleural effusion.   Original Report Authenticated By: Bertis Ruddy  [place] Pink, M.D.    Ct Angio Chest Pe W/cm &/or Wo Cm  04/01/2013  *RADIOLOGY REPORT*  Clinical Data: Shortness of breath.  Patient on dialysis.  Concern for pulmonary embolus.  CT ANGIOGRAPHY CHEST  Technique:  Multidetector CT imaging of the chest using the standard protocol during bolus administration of intravenous contrast. Multiplanar reconstructed images including MIPs were obtained and reviewed to evaluate the vascular anatomy.  Contrast: 175mL OMNIPAQUE IOHEXOL 350 MG/ML SOLN  Comparison: Chest radiograph performed 03/31/2013, and CT of the chest performed 05/17/2008  Findings: There is no evidence of pulmonary embolus.  There is mild interstitial prominence at the lower lung lobes and at the lung  apices, with scattered nonspecific small peripheral nodular densities seen in both lungs.  Much of this likely reflects mild interstitial edema, though a superimposed infectious process cannot be entirely excluded.  There is no evidence of pleural effusion or pneumothorax.  The heart is mildly enlarged.  Trace pericardial fluid remains within normal limits.  No definite mediastinal lymphadenopathy is seen.  Diffuse coronary artery calcifications are noted.  The great vessels are somewhat tortuous but grossly unremarkable in appearance, though difficult to fully assess due to the phase of contrast enhancement.  Scattered calcification is noted along the aortic arch and descending thoracic aorta.  No axillary lymphadenopathy is seen.  The thyroid gland is not well assessed.  The visualized portions of the liver and spleen are unremarkable. Cysts are seen about the markedly atrophic left kidney.  No acute osseous abnormalities are seen.  There is chronic deformity of the left posterolateral seventh rib.  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  IMPRESSION:  1.  No evidence of pulmonary embolus. 2.  Mild interstitial prominence within the lower lung lobes and at the lung apices, with scattered nonspecific small peripheral nodular densities in both lungs.  Much of this likely reflects mild interstitial edema, though superimposed infectious process cannot be entirely excluded. 3.  Mild cardiomegaly. 4.  Diffuse coronary artery calcifications seen.  5.  Left renal atrophy, with associated cysts.  The right kidney is not imaged on this study. 6.  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   Original Report Authenticated By: Santa Lighter, M.D.    Medications:   . allopurinol  150 mg Oral QHS  .  aspirin EC  81 mg Oral Daily  . darbepoetin (ARANESP) injection - DIALYSIS  150 mcg Intravenous Q Sat-HD  . doxazosin  8 mg Oral BID  . doxercalciferol  4 mcg Intravenous Q T,Th,Sa-HD  . feeding supplement (NEPRO CARB STEADY)  237 mL Oral BID BM  . heparin  5,000 Units Subcutaneous Q8H  . labetalol  300 mg Oral BID  . lisinopril  10 mg Oral Custom  . multivitamin  1 tablet Oral Daily  . simvastatin  20 mg Oral QHS  . sodium chloride  3 mL Intravenous Q12H

## 2013-04-05 ENCOUNTER — Encounter: Payer: Self-pay | Admitting: Internal Medicine

## 2013-04-07 NOTE — Telephone Encounter (Signed)
No entry 

## 2013-04-12 DIAGNOSIS — N186 End stage renal disease: Secondary | ICD-10-CM | POA: Diagnosis not present

## 2013-04-13 DIAGNOSIS — N2581 Secondary hyperparathyroidism of renal origin: Secondary | ICD-10-CM | POA: Diagnosis not present

## 2013-04-13 DIAGNOSIS — D509 Iron deficiency anemia, unspecified: Secondary | ICD-10-CM | POA: Diagnosis not present

## 2013-04-13 DIAGNOSIS — D631 Anemia in chronic kidney disease: Secondary | ICD-10-CM | POA: Diagnosis not present

## 2013-04-13 DIAGNOSIS — Z992 Dependence on renal dialysis: Secondary | ICD-10-CM | POA: Diagnosis not present

## 2013-04-13 DIAGNOSIS — N186 End stage renal disease: Secondary | ICD-10-CM | POA: Diagnosis not present

## 2013-04-17 DIAGNOSIS — H43819 Vitreous degeneration, unspecified eye: Secondary | ICD-10-CM | POA: Diagnosis not present

## 2013-04-17 DIAGNOSIS — H353290 Exudative age-related macular degeneration, unspecified eye, stage unspecified: Secondary | ICD-10-CM | POA: Diagnosis not present

## 2013-04-17 DIAGNOSIS — H353190 Nonexudative age-related macular degeneration, unspecified eye, stage unspecified: Secondary | ICD-10-CM | POA: Diagnosis not present

## 2013-04-17 DIAGNOSIS — H35059 Retinal neovascularization, unspecified, unspecified eye: Secondary | ICD-10-CM | POA: Diagnosis not present

## 2013-05-13 DIAGNOSIS — N186 End stage renal disease: Secondary | ICD-10-CM | POA: Diagnosis not present

## 2013-05-16 DIAGNOSIS — D509 Iron deficiency anemia, unspecified: Secondary | ICD-10-CM | POA: Diagnosis not present

## 2013-05-16 DIAGNOSIS — Z992 Dependence on renal dialysis: Secondary | ICD-10-CM | POA: Diagnosis not present

## 2013-05-16 DIAGNOSIS — N039 Chronic nephritic syndrome with unspecified morphologic changes: Secondary | ICD-10-CM | POA: Diagnosis not present

## 2013-05-16 DIAGNOSIS — N2581 Secondary hyperparathyroidism of renal origin: Secondary | ICD-10-CM | POA: Diagnosis not present

## 2013-05-16 DIAGNOSIS — D631 Anemia in chronic kidney disease: Secondary | ICD-10-CM | POA: Diagnosis not present

## 2013-05-16 DIAGNOSIS — N186 End stage renal disease: Secondary | ICD-10-CM | POA: Diagnosis not present

## 2013-05-17 ENCOUNTER — Other Ambulatory Visit: Payer: Self-pay | Admitting: Nephrology

## 2013-05-17 ENCOUNTER — Ambulatory Visit
Admission: RE | Admit: 2013-05-17 | Discharge: 2013-05-17 | Disposition: A | Discharge status: Home / Self Care | Payer: Medicare Other | Source: Ambulatory Visit | Attending: Nephrology | Admitting: Nephrology

## 2013-05-17 DIAGNOSIS — R05 Cough: Secondary | ICD-10-CM

## 2013-05-17 DIAGNOSIS — R059 Cough, unspecified: Secondary | ICD-10-CM

## 2013-05-17 DIAGNOSIS — J8410 Pulmonary fibrosis, unspecified: Secondary | ICD-10-CM | POA: Diagnosis not present

## 2013-05-18 DIAGNOSIS — D509 Iron deficiency anemia, unspecified: Secondary | ICD-10-CM | POA: Diagnosis not present

## 2013-05-18 DIAGNOSIS — N2581 Secondary hyperparathyroidism of renal origin: Secondary | ICD-10-CM | POA: Diagnosis not present

## 2013-05-18 DIAGNOSIS — N186 End stage renal disease: Secondary | ICD-10-CM | POA: Diagnosis not present

## 2013-05-18 DIAGNOSIS — D631 Anemia in chronic kidney disease: Secondary | ICD-10-CM | POA: Diagnosis not present

## 2013-05-18 DIAGNOSIS — Z992 Dependence on renal dialysis: Secondary | ICD-10-CM | POA: Diagnosis not present

## 2013-05-20 DIAGNOSIS — N2581 Secondary hyperparathyroidism of renal origin: Secondary | ICD-10-CM | POA: Diagnosis not present

## 2013-05-20 DIAGNOSIS — N186 End stage renal disease: Secondary | ICD-10-CM | POA: Diagnosis not present

## 2013-05-20 DIAGNOSIS — D509 Iron deficiency anemia, unspecified: Secondary | ICD-10-CM | POA: Diagnosis not present

## 2013-05-20 DIAGNOSIS — D631 Anemia in chronic kidney disease: Secondary | ICD-10-CM | POA: Diagnosis not present

## 2013-05-20 DIAGNOSIS — Z992 Dependence on renal dialysis: Secondary | ICD-10-CM | POA: Diagnosis not present

## 2013-05-23 DIAGNOSIS — N186 End stage renal disease: Secondary | ICD-10-CM | POA: Diagnosis not present

## 2013-05-23 DIAGNOSIS — D631 Anemia in chronic kidney disease: Secondary | ICD-10-CM | POA: Diagnosis not present

## 2013-05-23 DIAGNOSIS — Z992 Dependence on renal dialysis: Secondary | ICD-10-CM | POA: Diagnosis not present

## 2013-05-23 DIAGNOSIS — D509 Iron deficiency anemia, unspecified: Secondary | ICD-10-CM | POA: Diagnosis not present

## 2013-05-23 DIAGNOSIS — N2581 Secondary hyperparathyroidism of renal origin: Secondary | ICD-10-CM | POA: Diagnosis not present

## 2013-05-25 DIAGNOSIS — D631 Anemia in chronic kidney disease: Secondary | ICD-10-CM | POA: Diagnosis not present

## 2013-05-25 DIAGNOSIS — N186 End stage renal disease: Secondary | ICD-10-CM | POA: Diagnosis not present

## 2013-05-25 DIAGNOSIS — N039 Chronic nephritic syndrome with unspecified morphologic changes: Secondary | ICD-10-CM | POA: Diagnosis not present

## 2013-05-25 DIAGNOSIS — Z992 Dependence on renal dialysis: Secondary | ICD-10-CM | POA: Diagnosis not present

## 2013-05-25 DIAGNOSIS — D509 Iron deficiency anemia, unspecified: Secondary | ICD-10-CM | POA: Diagnosis not present

## 2013-05-25 DIAGNOSIS — N2581 Secondary hyperparathyroidism of renal origin: Secondary | ICD-10-CM | POA: Diagnosis not present

## 2013-05-26 DIAGNOSIS — H6120 Impacted cerumen, unspecified ear: Secondary | ICD-10-CM | POA: Diagnosis not present

## 2013-05-27 DIAGNOSIS — Z992 Dependence on renal dialysis: Secondary | ICD-10-CM | POA: Diagnosis not present

## 2013-05-27 DIAGNOSIS — D509 Iron deficiency anemia, unspecified: Secondary | ICD-10-CM | POA: Diagnosis not present

## 2013-05-27 DIAGNOSIS — N2581 Secondary hyperparathyroidism of renal origin: Secondary | ICD-10-CM | POA: Diagnosis not present

## 2013-05-27 DIAGNOSIS — D631 Anemia in chronic kidney disease: Secondary | ICD-10-CM | POA: Diagnosis not present

## 2013-05-27 DIAGNOSIS — N186 End stage renal disease: Secondary | ICD-10-CM | POA: Diagnosis not present

## 2013-05-29 DIAGNOSIS — H353290 Exudative age-related macular degeneration, unspecified eye, stage unspecified: Secondary | ICD-10-CM | POA: Diagnosis not present

## 2013-05-29 DIAGNOSIS — H43819 Vitreous degeneration, unspecified eye: Secondary | ICD-10-CM | POA: Diagnosis not present

## 2013-05-29 DIAGNOSIS — H353190 Nonexudative age-related macular degeneration, unspecified eye, stage unspecified: Secondary | ICD-10-CM | POA: Diagnosis not present

## 2013-05-29 DIAGNOSIS — H35059 Retinal neovascularization, unspecified, unspecified eye: Secondary | ICD-10-CM | POA: Diagnosis not present

## 2013-05-30 DIAGNOSIS — D509 Iron deficiency anemia, unspecified: Secondary | ICD-10-CM | POA: Diagnosis not present

## 2013-05-30 DIAGNOSIS — N039 Chronic nephritic syndrome with unspecified morphologic changes: Secondary | ICD-10-CM | POA: Diagnosis not present

## 2013-05-30 DIAGNOSIS — D631 Anemia in chronic kidney disease: Secondary | ICD-10-CM | POA: Diagnosis not present

## 2013-05-30 DIAGNOSIS — N2581 Secondary hyperparathyroidism of renal origin: Secondary | ICD-10-CM | POA: Diagnosis not present

## 2013-05-30 DIAGNOSIS — Z992 Dependence on renal dialysis: Secondary | ICD-10-CM | POA: Diagnosis not present

## 2013-05-30 DIAGNOSIS — N186 End stage renal disease: Secondary | ICD-10-CM | POA: Diagnosis not present

## 2013-06-01 DIAGNOSIS — Z992 Dependence on renal dialysis: Secondary | ICD-10-CM | POA: Diagnosis not present

## 2013-06-01 DIAGNOSIS — N2581 Secondary hyperparathyroidism of renal origin: Secondary | ICD-10-CM | POA: Diagnosis not present

## 2013-06-01 DIAGNOSIS — D631 Anemia in chronic kidney disease: Secondary | ICD-10-CM | POA: Diagnosis not present

## 2013-06-01 DIAGNOSIS — N186 End stage renal disease: Secondary | ICD-10-CM | POA: Diagnosis not present

## 2013-06-01 DIAGNOSIS — D509 Iron deficiency anemia, unspecified: Secondary | ICD-10-CM | POA: Diagnosis not present

## 2013-06-03 DIAGNOSIS — N186 End stage renal disease: Secondary | ICD-10-CM | POA: Diagnosis not present

## 2013-06-03 DIAGNOSIS — D631 Anemia in chronic kidney disease: Secondary | ICD-10-CM | POA: Diagnosis not present

## 2013-06-03 DIAGNOSIS — D509 Iron deficiency anemia, unspecified: Secondary | ICD-10-CM | POA: Diagnosis not present

## 2013-06-03 DIAGNOSIS — Z992 Dependence on renal dialysis: Secondary | ICD-10-CM | POA: Diagnosis not present

## 2013-06-03 DIAGNOSIS — N2581 Secondary hyperparathyroidism of renal origin: Secondary | ICD-10-CM | POA: Diagnosis not present

## 2013-06-03 DIAGNOSIS — N039 Chronic nephritic syndrome with unspecified morphologic changes: Secondary | ICD-10-CM | POA: Diagnosis not present

## 2013-06-06 DIAGNOSIS — N2581 Secondary hyperparathyroidism of renal origin: Secondary | ICD-10-CM | POA: Diagnosis not present

## 2013-06-06 DIAGNOSIS — N039 Chronic nephritic syndrome with unspecified morphologic changes: Secondary | ICD-10-CM | POA: Diagnosis not present

## 2013-06-06 DIAGNOSIS — D509 Iron deficiency anemia, unspecified: Secondary | ICD-10-CM | POA: Diagnosis not present

## 2013-06-06 DIAGNOSIS — Z992 Dependence on renal dialysis: Secondary | ICD-10-CM | POA: Diagnosis not present

## 2013-06-06 DIAGNOSIS — N186 End stage renal disease: Secondary | ICD-10-CM | POA: Diagnosis not present

## 2013-06-06 DIAGNOSIS — D631 Anemia in chronic kidney disease: Secondary | ICD-10-CM | POA: Diagnosis not present

## 2013-06-08 DIAGNOSIS — N2581 Secondary hyperparathyroidism of renal origin: Secondary | ICD-10-CM | POA: Diagnosis not present

## 2013-06-08 DIAGNOSIS — Z992 Dependence on renal dialysis: Secondary | ICD-10-CM | POA: Diagnosis not present

## 2013-06-08 DIAGNOSIS — D509 Iron deficiency anemia, unspecified: Secondary | ICD-10-CM | POA: Diagnosis not present

## 2013-06-08 DIAGNOSIS — D631 Anemia in chronic kidney disease: Secondary | ICD-10-CM | POA: Diagnosis not present

## 2013-06-08 DIAGNOSIS — N186 End stage renal disease: Secondary | ICD-10-CM | POA: Diagnosis not present

## 2013-06-10 DIAGNOSIS — D631 Anemia in chronic kidney disease: Secondary | ICD-10-CM | POA: Diagnosis not present

## 2013-06-10 DIAGNOSIS — N2581 Secondary hyperparathyroidism of renal origin: Secondary | ICD-10-CM | POA: Diagnosis not present

## 2013-06-10 DIAGNOSIS — N186 End stage renal disease: Secondary | ICD-10-CM | POA: Diagnosis not present

## 2013-06-10 DIAGNOSIS — D509 Iron deficiency anemia, unspecified: Secondary | ICD-10-CM | POA: Diagnosis not present

## 2013-06-10 DIAGNOSIS — N039 Chronic nephritic syndrome with unspecified morphologic changes: Secondary | ICD-10-CM | POA: Diagnosis not present

## 2013-06-10 DIAGNOSIS — Z992 Dependence on renal dialysis: Secondary | ICD-10-CM | POA: Diagnosis not present

## 2013-06-12 DIAGNOSIS — N186 End stage renal disease: Secondary | ICD-10-CM | POA: Diagnosis not present

## 2013-06-13 DIAGNOSIS — N2581 Secondary hyperparathyroidism of renal origin: Secondary | ICD-10-CM | POA: Diagnosis not present

## 2013-06-13 DIAGNOSIS — Z992 Dependence on renal dialysis: Secondary | ICD-10-CM | POA: Diagnosis not present

## 2013-06-13 DIAGNOSIS — D509 Iron deficiency anemia, unspecified: Secondary | ICD-10-CM | POA: Diagnosis not present

## 2013-06-13 DIAGNOSIS — N186 End stage renal disease: Secondary | ICD-10-CM | POA: Diagnosis not present

## 2013-06-13 DIAGNOSIS — D631 Anemia in chronic kidney disease: Secondary | ICD-10-CM | POA: Diagnosis not present

## 2013-06-15 IMAGING — US US ABDOMEN COMPLETE
1 series · 13 of 25 positions shown · non-contrast
Comparison: Renal ultrasound performed 03/04/2008, and MRI of the
pelvis performed 08/19/2012

CLINICAL DATA: Abdominal pain and fever.

ABDOMINAL ULTRASOUND COMPLETE

[Series 1: us abdomen complete · 0.26mm/px · 13 of 96 slices shown]
[im 1/96]
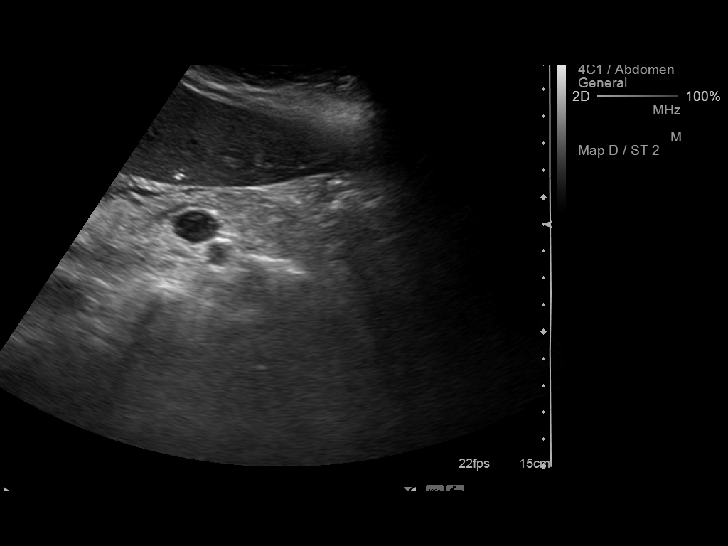
[im 8/96]
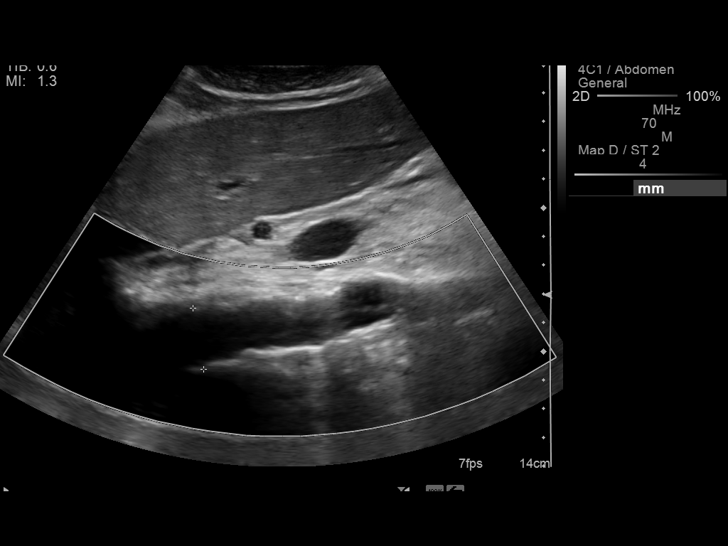
[im 16/96]
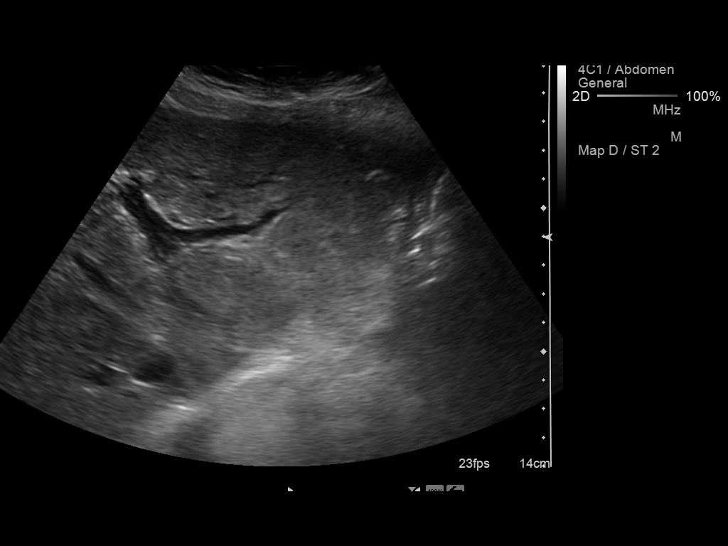
[im 24/96]
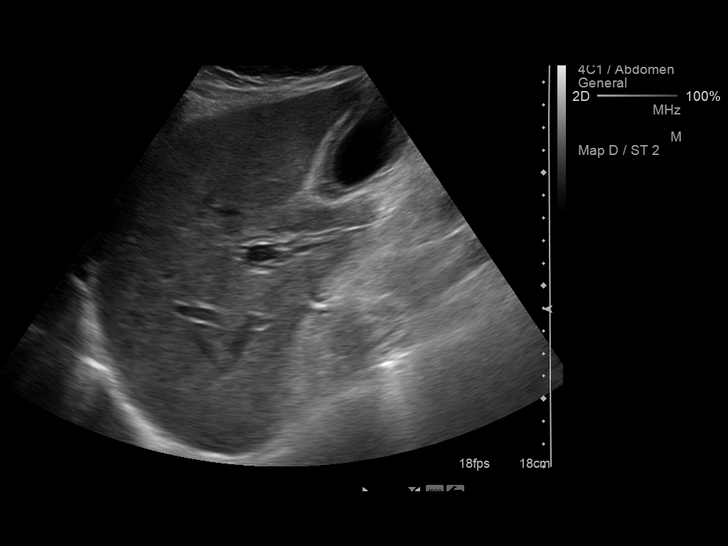
[im 32/96]
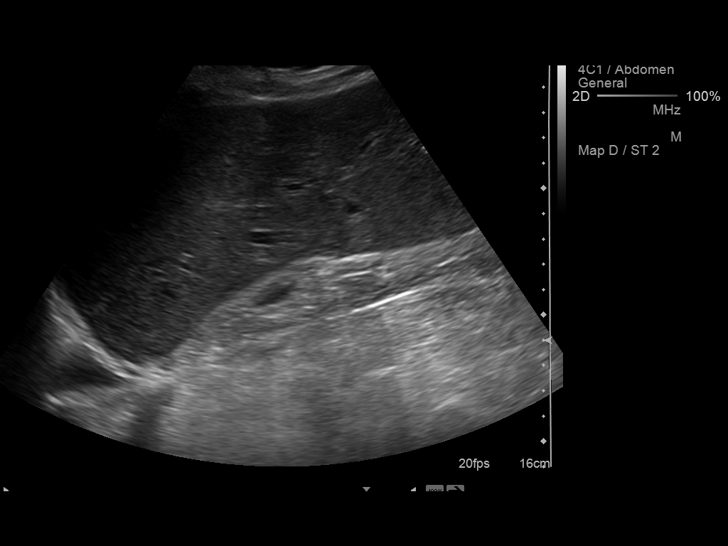
[im 40/96]
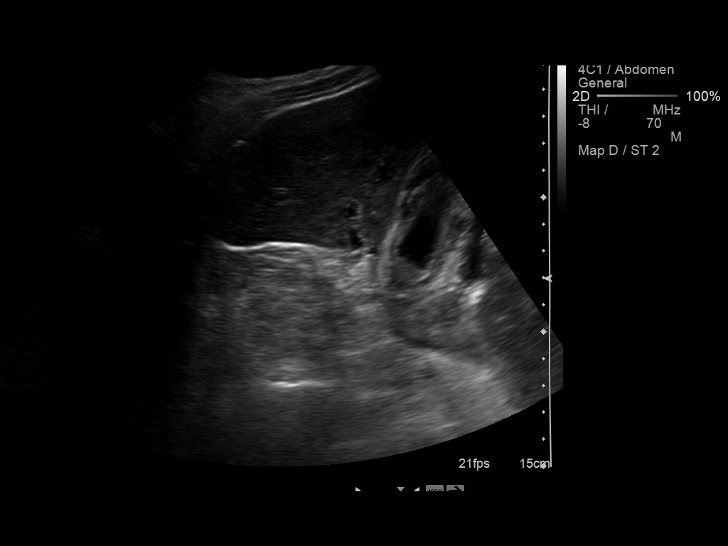
[im 48/96]
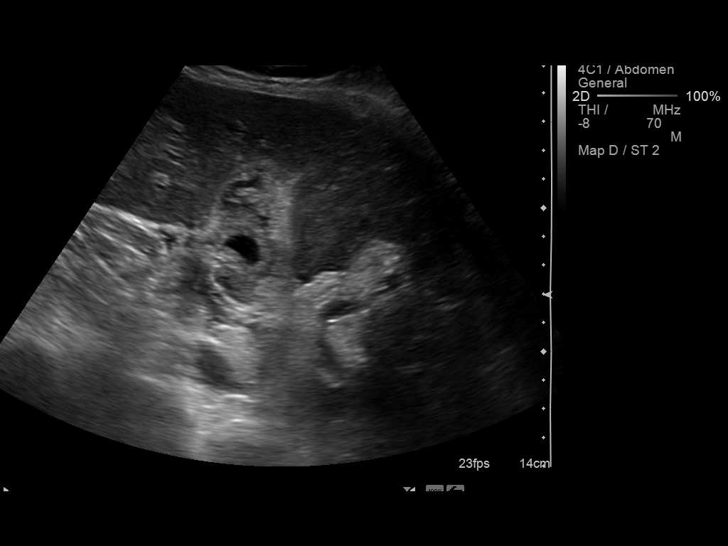
[im 56/96]
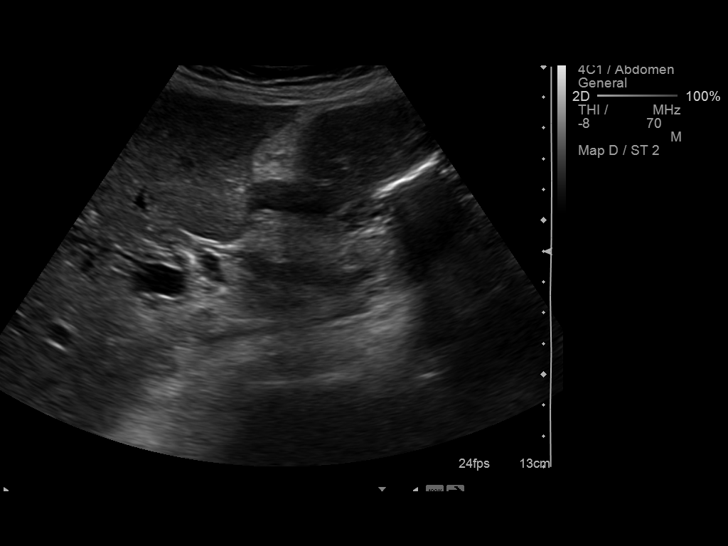
[im 64/96]
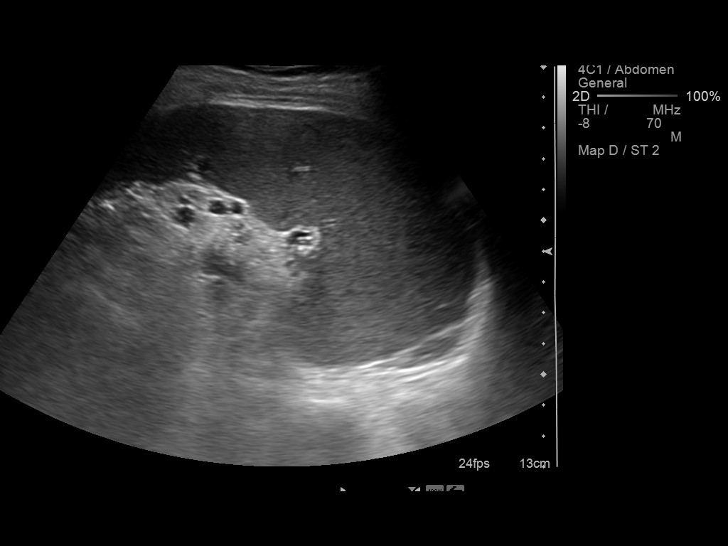
[im 72/96]
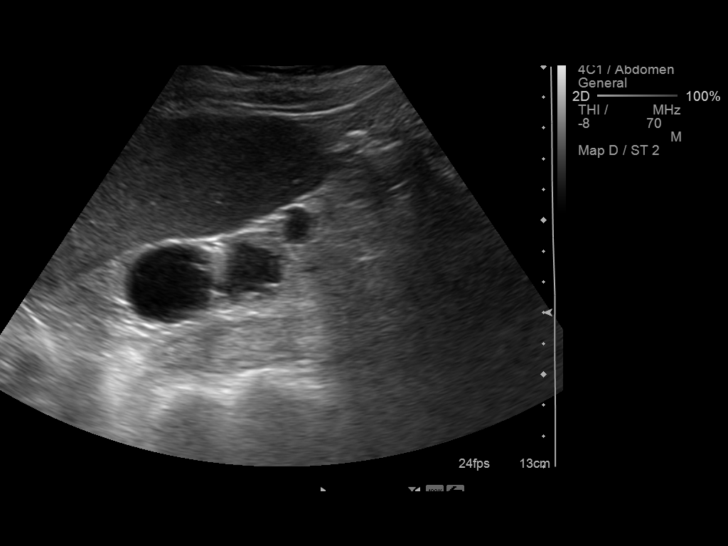
[im 80/96]
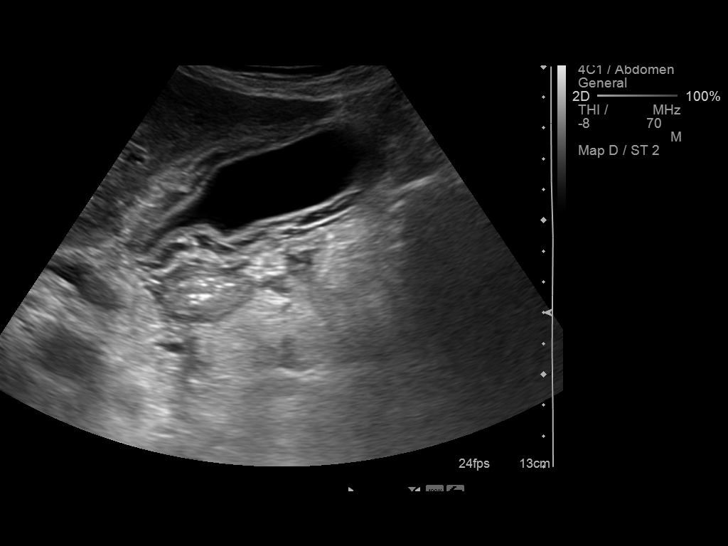
[im 88/96]
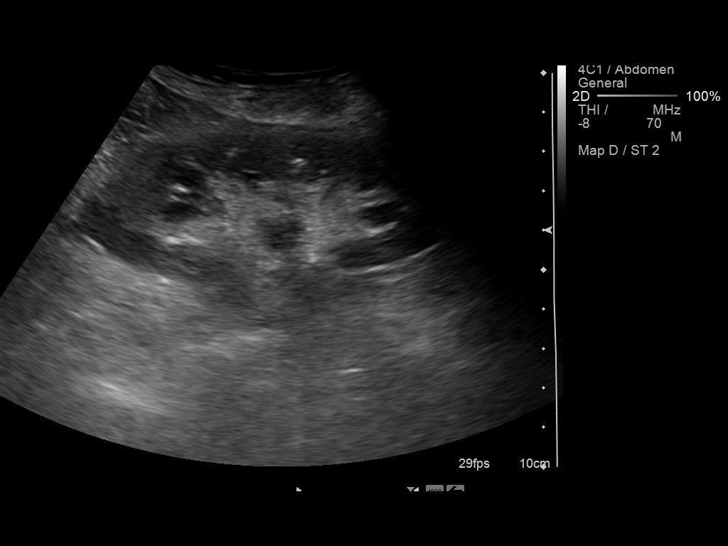
[im 96/96]
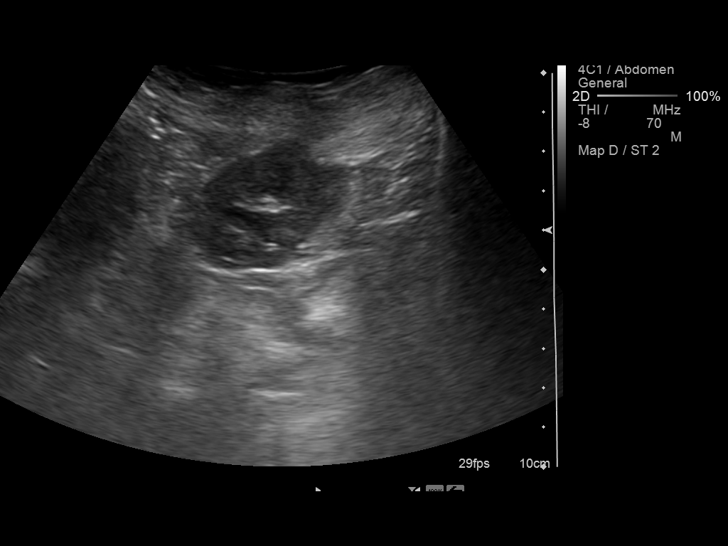

[13 of 25 positions shown; findings below may reference images not displayed]

FINDINGS: Gallbladder: The gallbladder wall is thickened and edematous,
measuring up to 1.0 cm in thickness.  A 5 mm polyp is noted near
the base of the gallbladder.  No significant pericholecystic fluid
is seen; a positive ultrasonographic Murphy's sign is elicited.

Common Bile Duct:  0.6 cm in diameter; within normal limits in
caliber.

Liver:  Normal parenchymal echogenicity and echotexture; no focal
lesions identified.  Limited Doppler evaluation demonstrates normal
blood flow within the liver.

IVC:  Unremarkable in appearance.

Pancreas:  Although the pancreas is difficult to visualize in its
entirety due to overlying bowel gas, no focal pancreatic
abnormality is identified.

Spleen:  12.9 cm in length; measures 348 mL in volume, within
normal limits, with scattered calcified granulomata.

Right kidney:  5.8 cm in length; diffusely atrophic, with scattered
small cysts.

Left kidney:  6.7 cm in length; diffusely atrophic, with scattered
small cysts.

Abdominal Aorta:  Normal in caliber; no aneurysm identified.
Plaque is noted along the course of the abdominal aorta.

Trace ascites is noted.  A small right pleural effusion is seen.

The patient's transplant kidney, noted within the pelvis, measures
10.1 cm in length, and demonstrates very mild hydronephrosis.
IMPRESSION: 1.  Diffusely thickened and edematous gallbladder wall, measuring
up to 1.0 cm in thickness, with a positive ultrasonographic
Murphy's sign; this raises concern for cholecystitis.  No definite
stones identified on ultrasound; this could conceivably reflect
acalculous cholecystitis.  Suggest clinical correlation.
2.  Apparent 5 mm polyp noted at the base of the gallbladder.
3.  Diffusely atrophic native kidneys, reflecting the patient's
chronic renal failure.
4.  Very mild hydronephrosis noted with respect to the patient's
transplant kidney in the pelvis.

## 2013-06-15 IMAGING — CR DG CHEST 1V PORT
2 series · 2 of 2 positions shown · non-contrast
Comparison: Chest x-ray 11/15/2009.

CLINICAL DATA: Fever, shortness of breath and chest pain.

PORTABLE CHEST - 1 VIEW

[AP (1 of 2)]
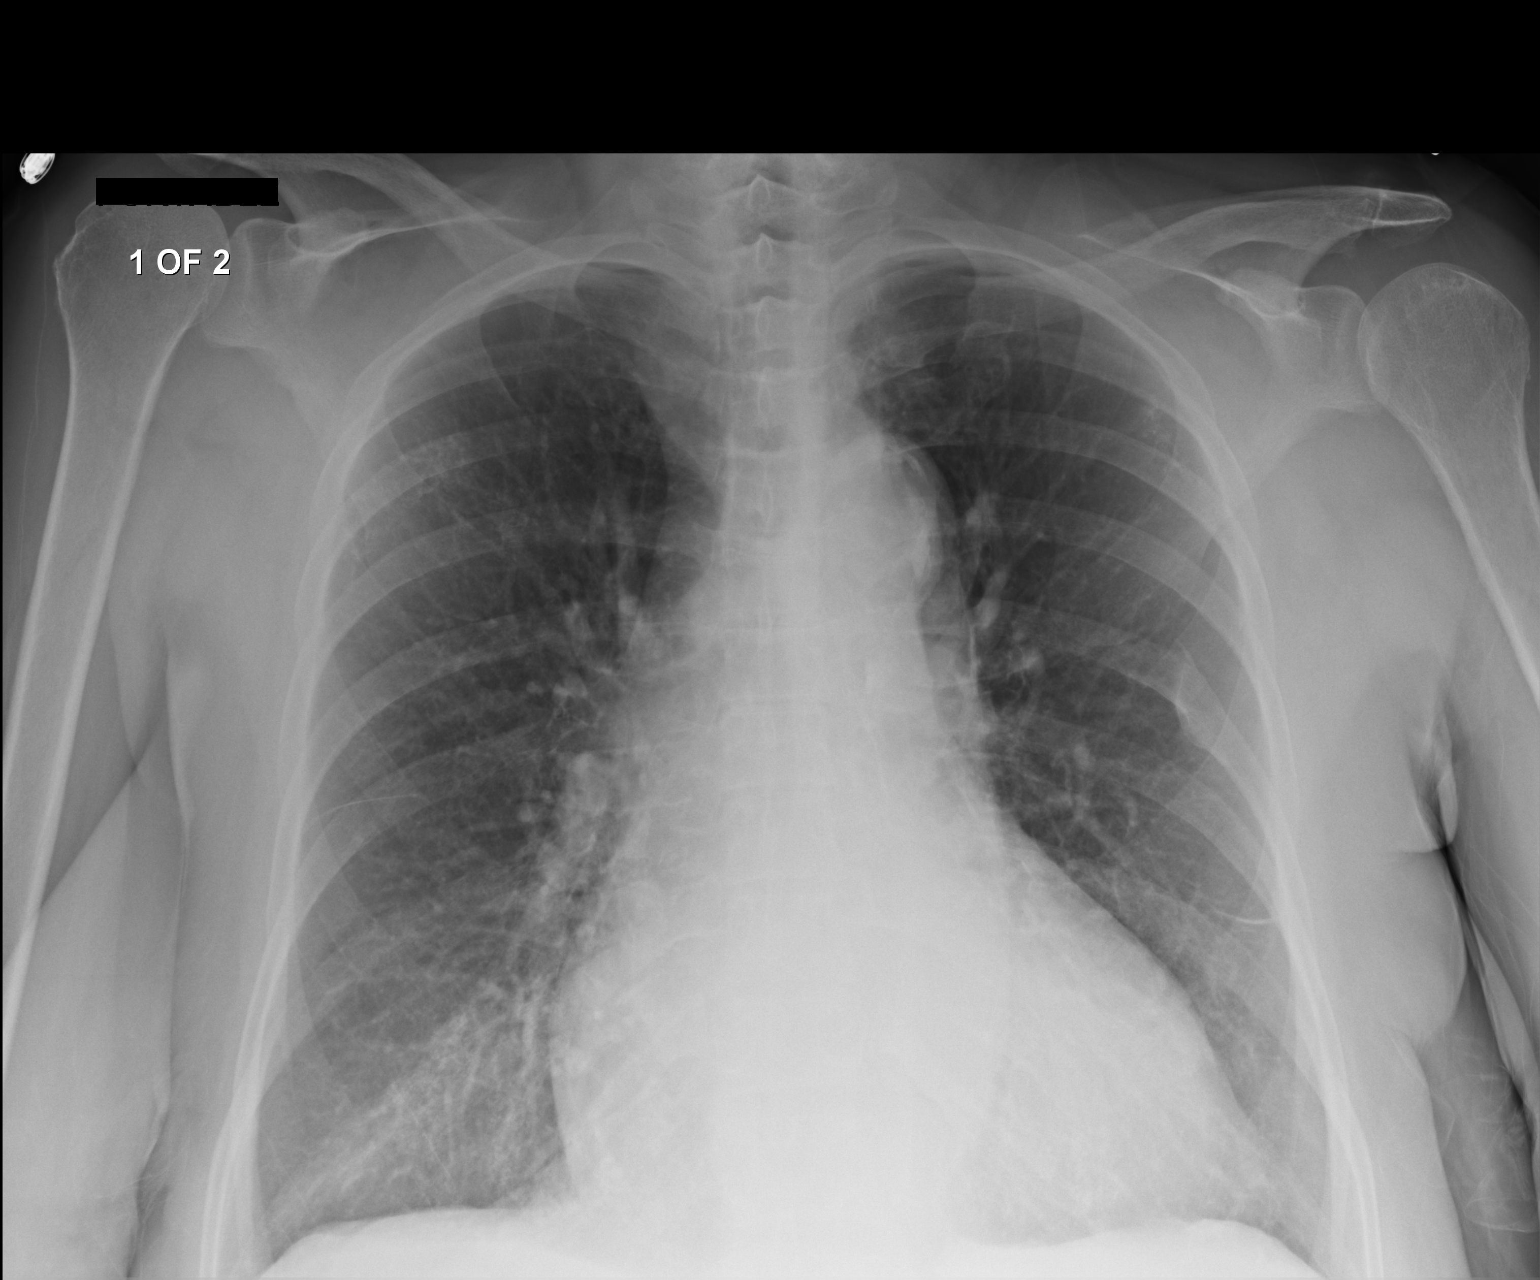

[AP (2 of 2)]
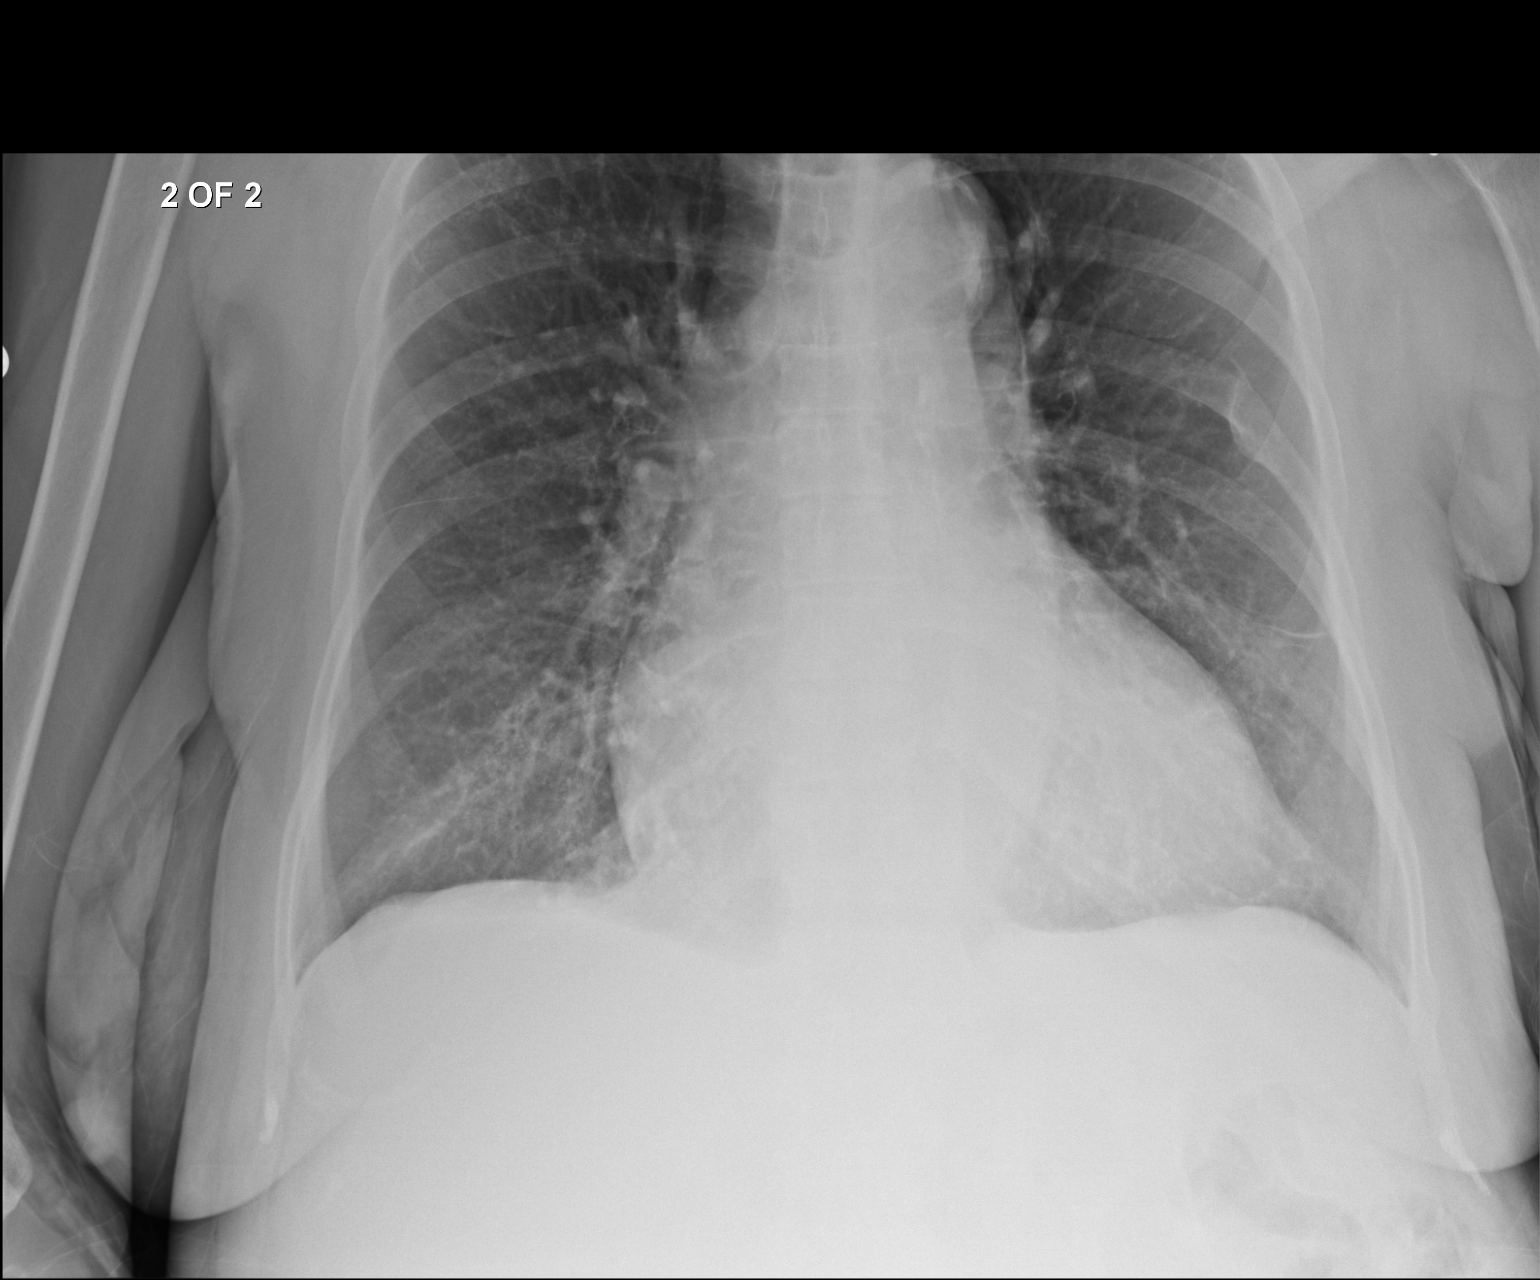

[2 of 2 positions shown; findings below may reference images not displayed]

FINDINGS: Interval development of the interstitial prominence and
ill-defined air space disease in the right base.  This does not
obscure either the right hemidiaphragm or the right heart border.
Fine pattern of peripheral micronodularity in the upper lobes of
the lungs bilaterally are unchanged compared to prior examinations.
No definite pleural effusions.  Pulmonary vasculature is normal.
Heart size is mildly enlarged.  Mediastinal contours are
unremarkable.  Atherosclerosis of the thoracic aorta.  Old healed
fracture of the posterolateral aspect of the left seventh rib is
again noted.  Orthopedic fixation hardware in the mid cervical
spine incompletely visualized.
IMPRESSION: 1.  New ill-defined interstitial and airspace opacity in the right
base, favored to be in the right lower lobe, concerning for
bronchopneumonia.
2.  Radiographic appearance of the chest is otherwise essentially
unchanged compared to prior examinations, as above.

## 2013-07-07 IMAGING — CR DG CHEST 2V
2 series · 2 of 2 positions shown · non-contrast
Comparison: 11/06/2012.

CLINICAL DATA: Chest pain and weakness.

CHEST - 2 VIEW

[w chest pa]
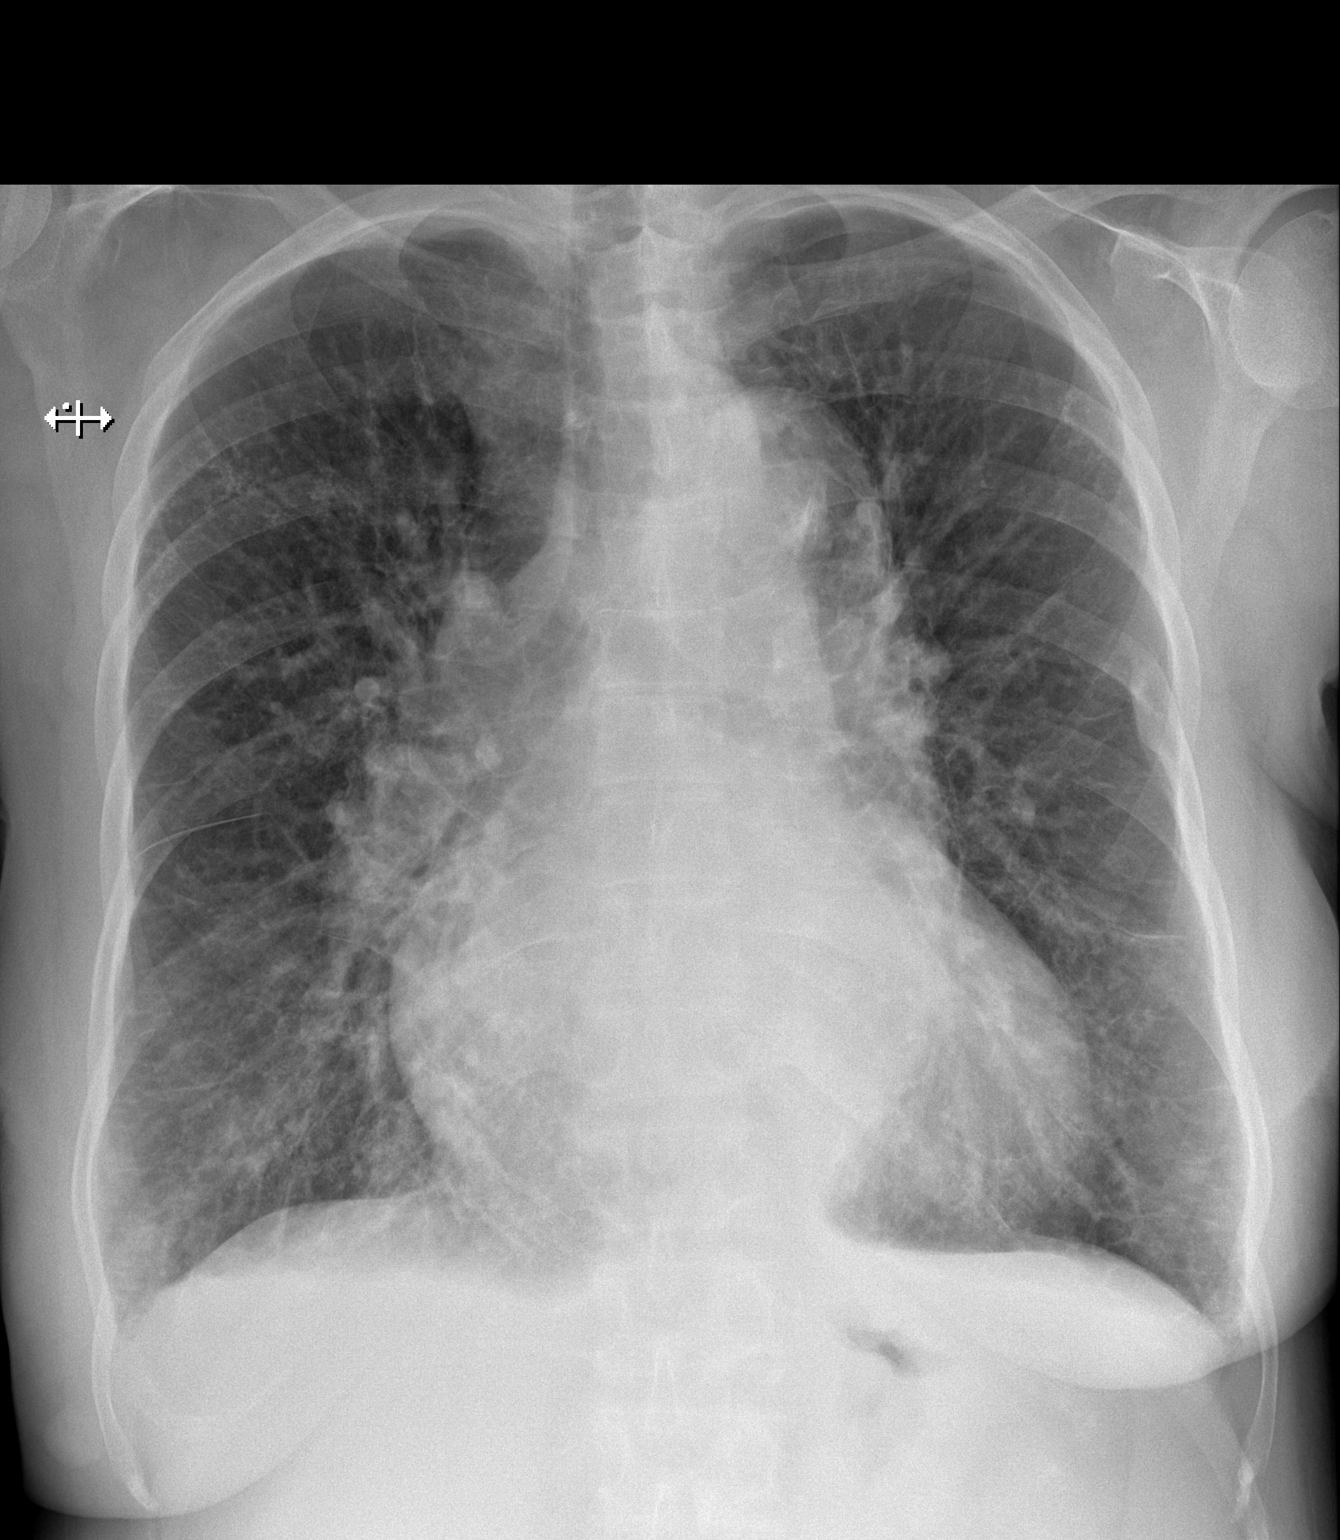

[w chest lat]
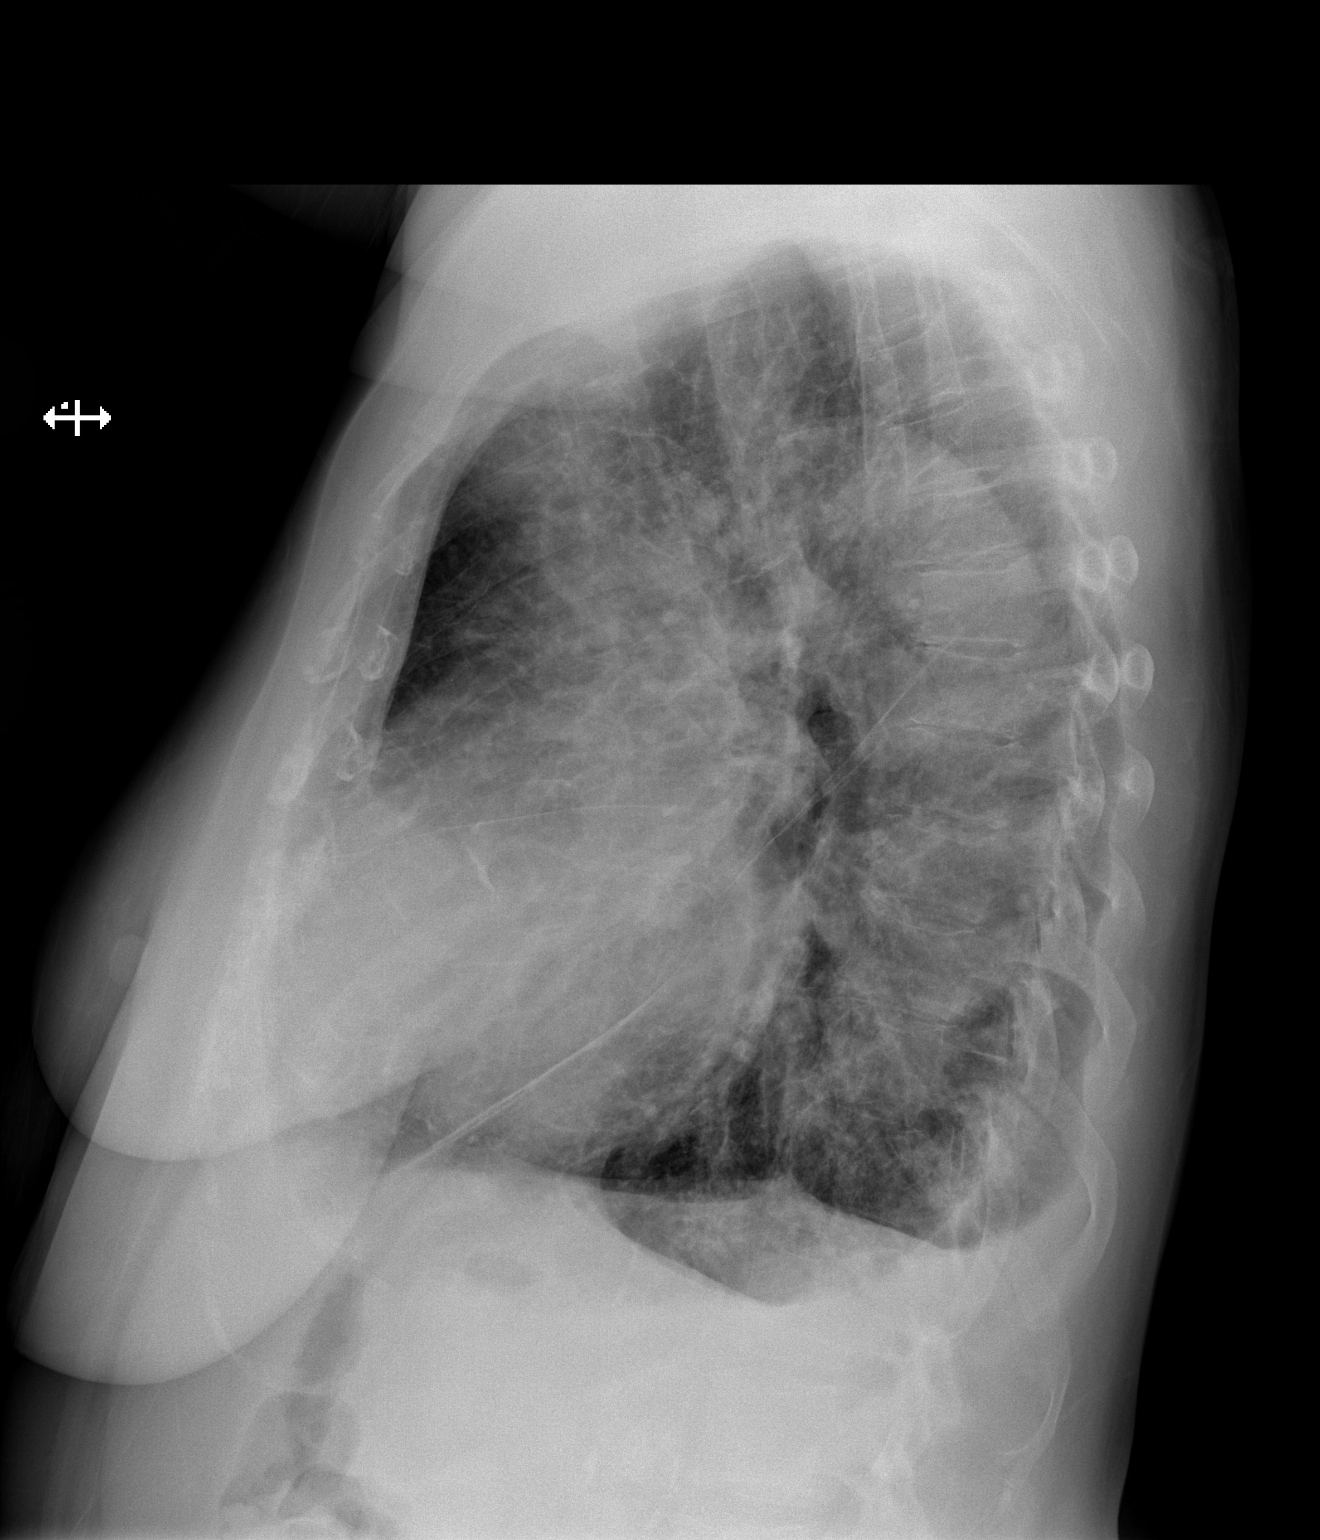

[2 of 2 positions shown; findings below may reference images not displayed]

FINDINGS: The heart is enlarged but stable.  There is tortuosity,
ectasia and calcification of the thoracic aorta.  The pulmonary
hila are slightly prominent but unchanged.  There are increased
interstitial markings, Kerley B lines and peribronchial thickening
consistent with interstitial pulmonary edema.  Small bilateral
pleural effusions are noted.  No focal infiltrate.
IMPRESSION: Mild CHF.

## 2013-07-07 IMAGING — CR DG HAND COMPLETE 3+V*L*
3 series · 3 of 3 positions shown · non-contrast
Comparison: None.

CLINICAL DATA: Left hand pain and joint swelling without injury.

LEFT HAND - COMPLETE 3+ VIEW

[PA]
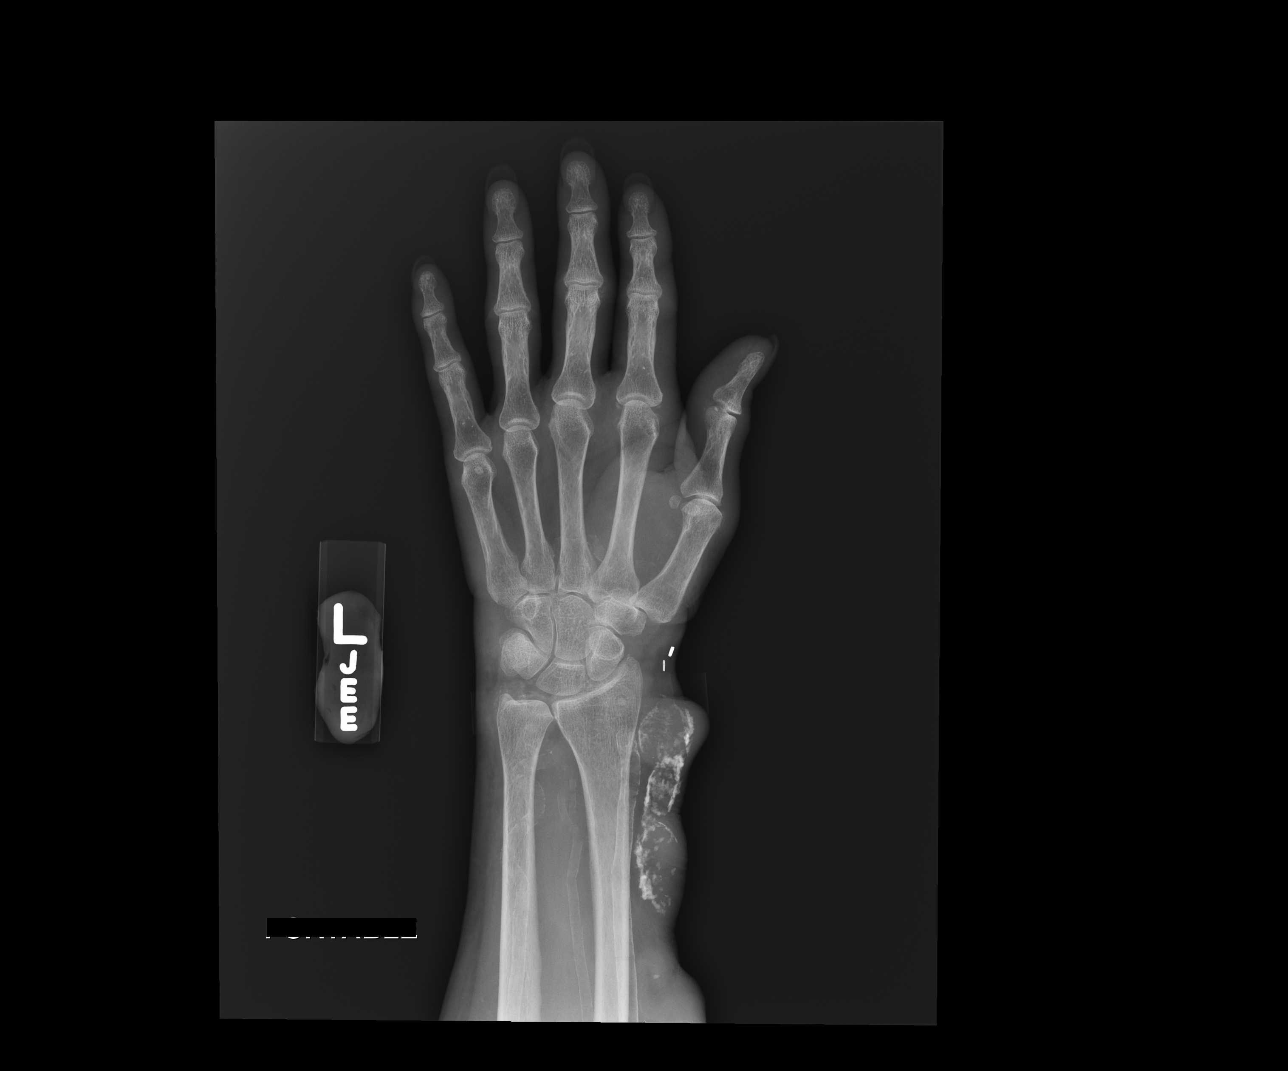

[lateral]
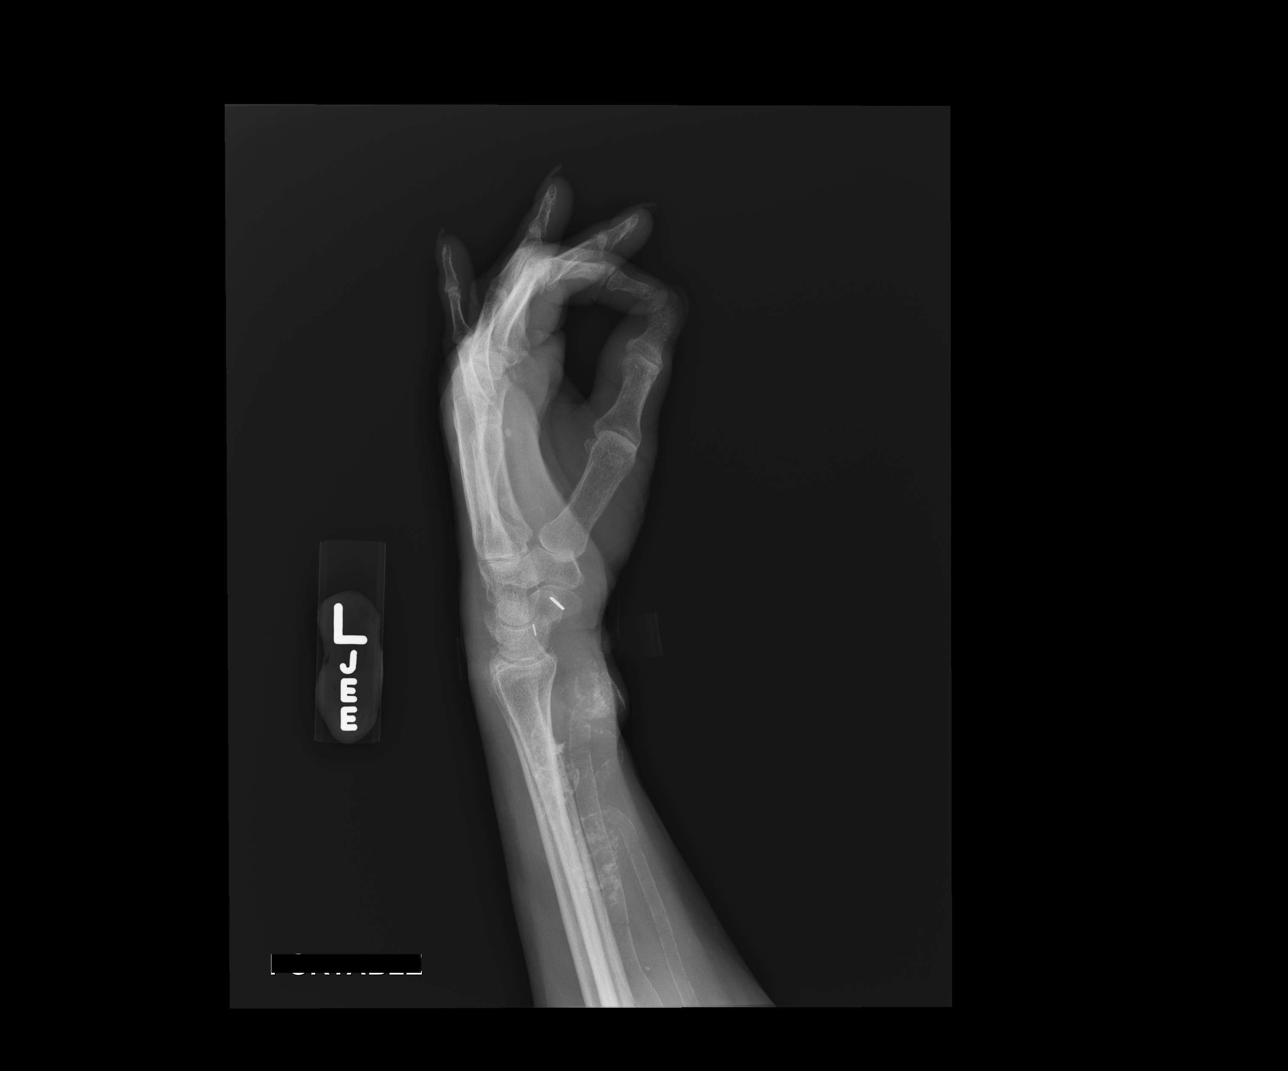

[pa obl]
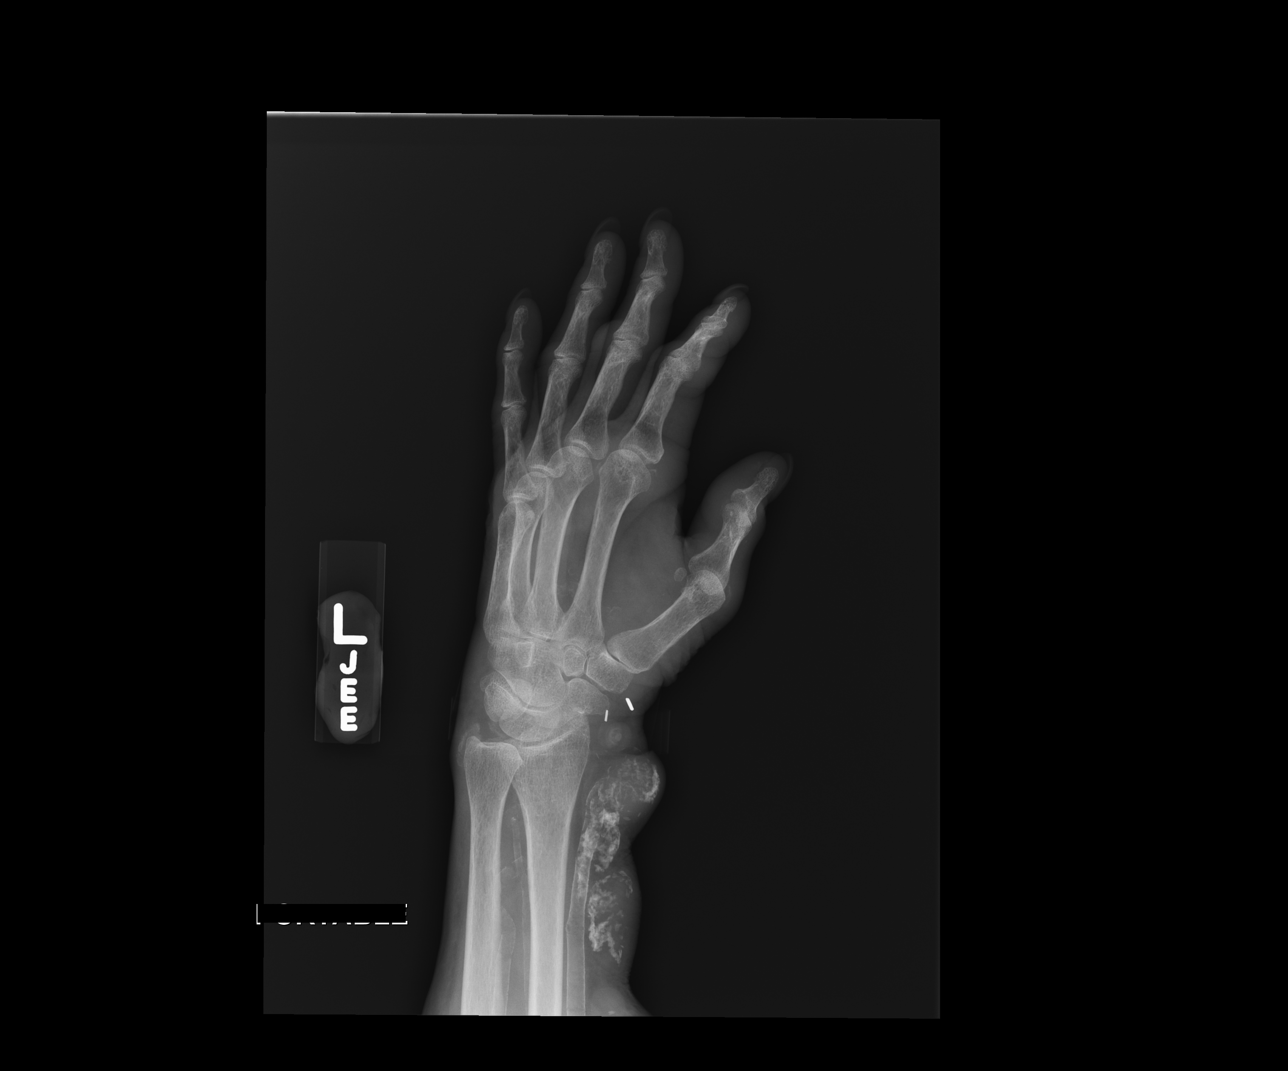

[3 of 3 positions shown; findings below may reference images not displayed]

FINDINGS: Mild osteopenia. No acute fracture or dislocation.
Extensive vascular calcifications. Amorphous calcifications about
the radial volar aspect of the wrist may be the site of an AV
fistula.  There are probable concurrent calcifications in the
triangular fibrocartilage.
IMPRESSION: No acute osseous abnormality.

Probable calcification in the triangular fibrocartilage, suggesting
calcium pyrophosphate deposition disease.

Extensive vascular calcifications,  including likely within a
radial  fistula.

## 2013-07-08 IMAGING — CR DG CHEST 1V PORT
1 series · 1 of 1 positions shown · non-contrast
Comparison: Chest x-ray of 11/28/2012

CLINICAL DATA: Slight shortness of breath and left-sided chest
pressure

PORTABLE CHEST - 1 VIEW

[AP]
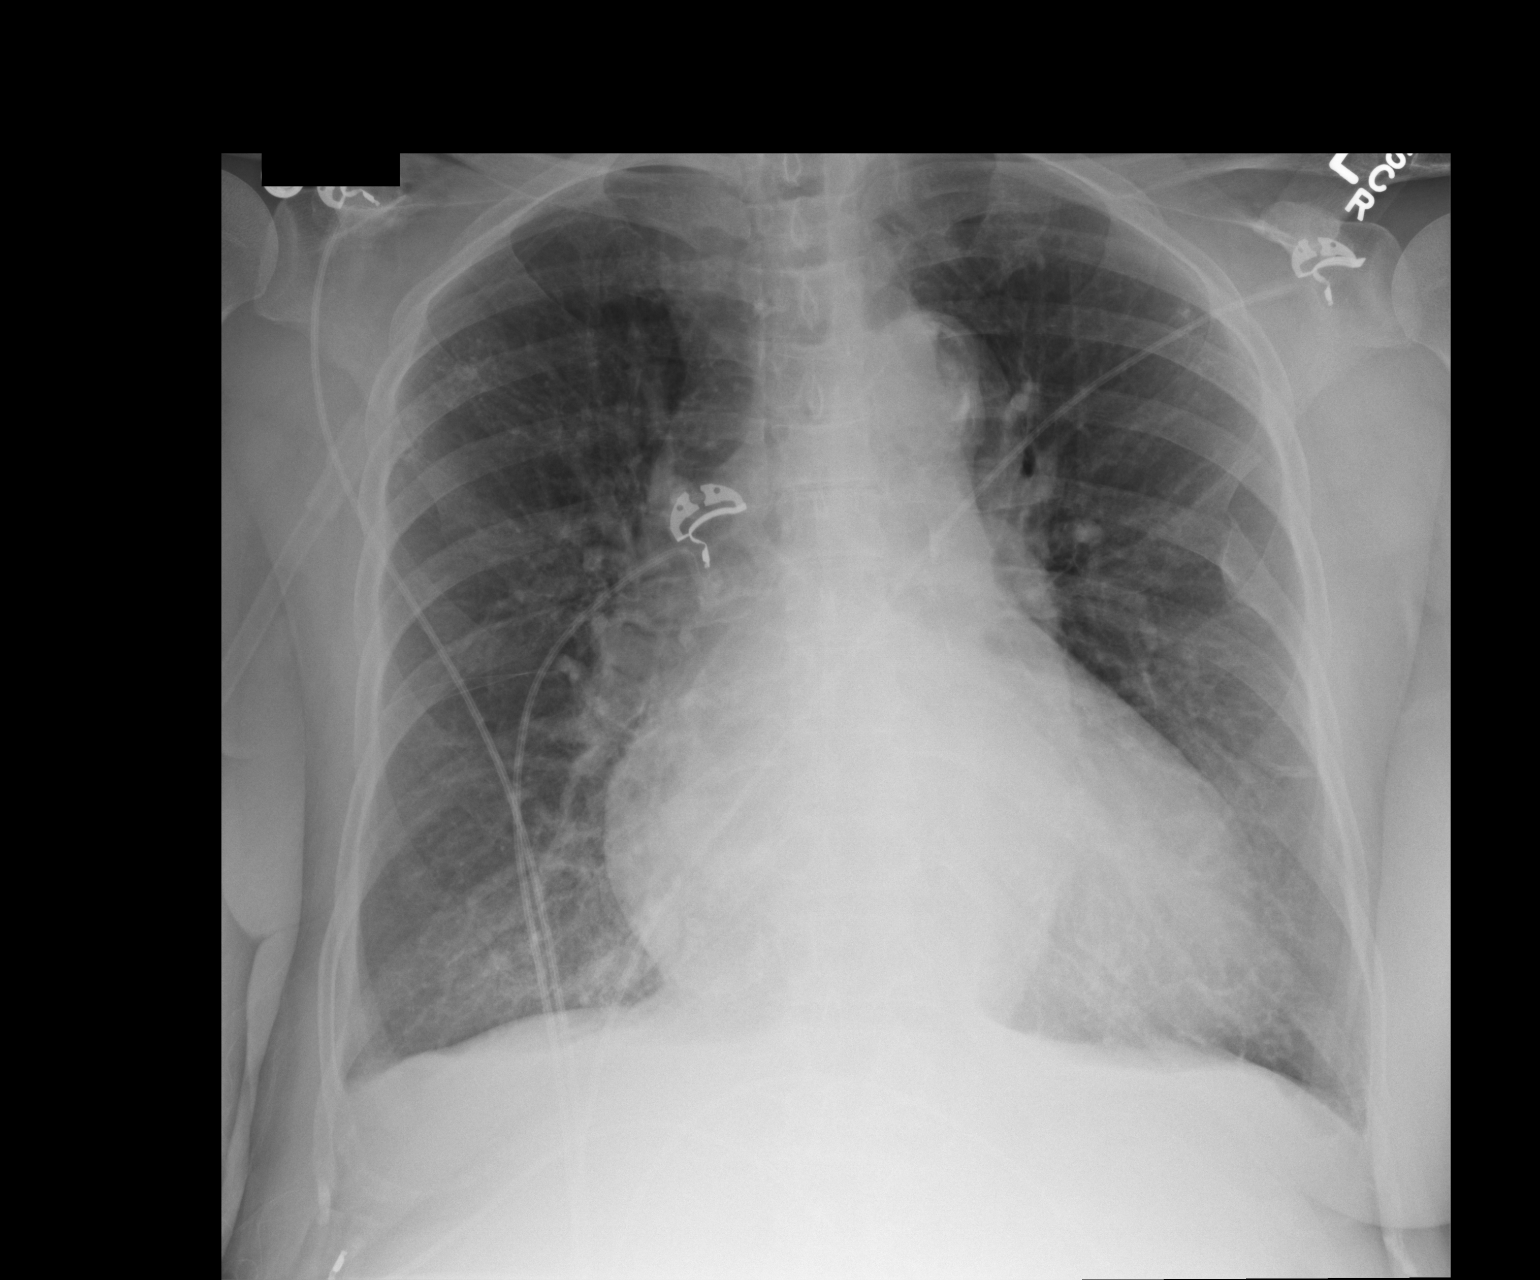

[1 of 1 positions shown; findings below may reference images not displayed]

FINDINGS: The interstitial edema pattern has improved.  Moderate
cardiomegaly remains with minimal pulmonary vascular congestion.
No bony abnormality is seen.
IMPRESSION: Improvement in interstitial edema.

## 2013-07-11 IMAGING — RF DG CHOLANGIOGRAM OPERATIVE
1 series · 4 of 4 positions shown · non-contrast
Comparison: None

CLINICAL DATA: Cholecystitis

INTRAOPERATIVE CHOLANGIOGRAM
TECHNIQUE: Cholangiographic images from the C-arm fluoroscopic
device were submitted for interpretation post-operatively.  Please
see the procedural report for the amount of contrast and the
fluoroscopy time utilized.

[Series 1: run · 4 of 40 frames shown]
[frame 7/40]
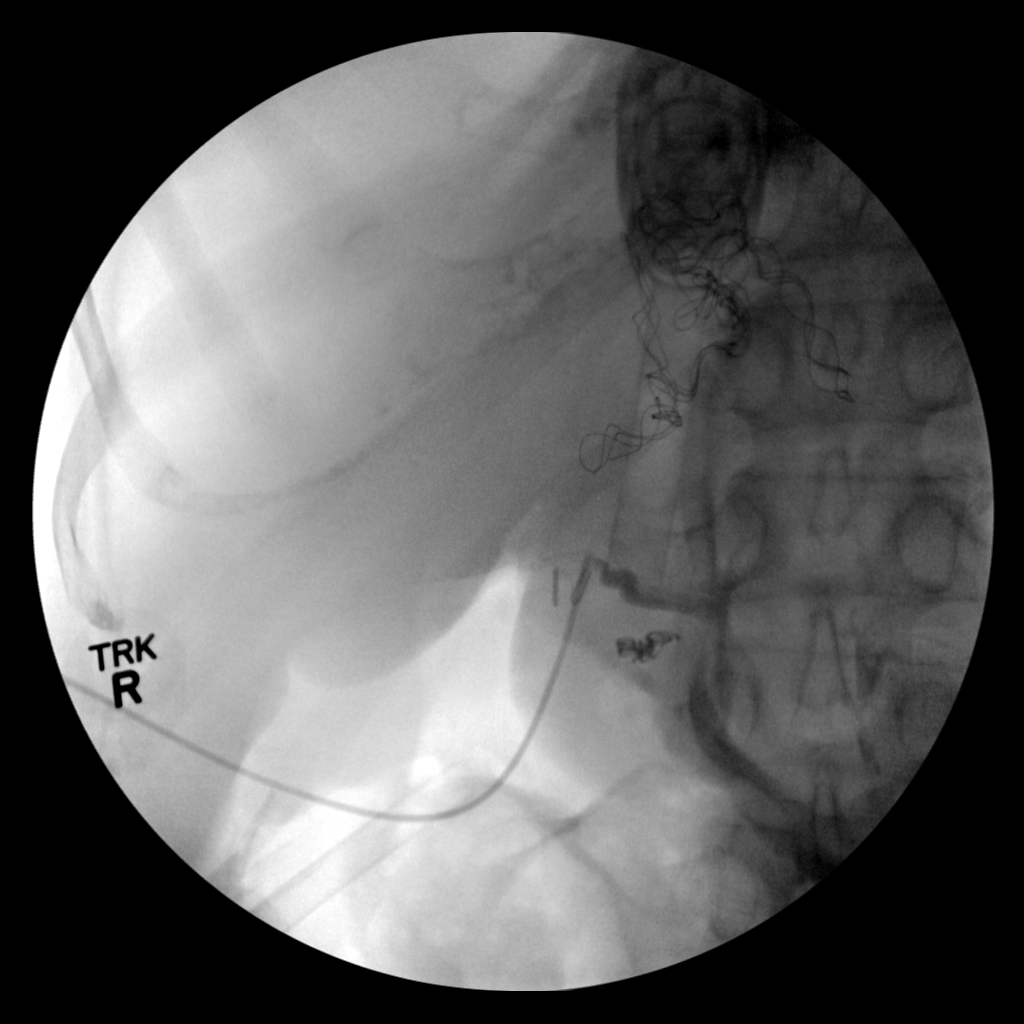
[frame 21/40]
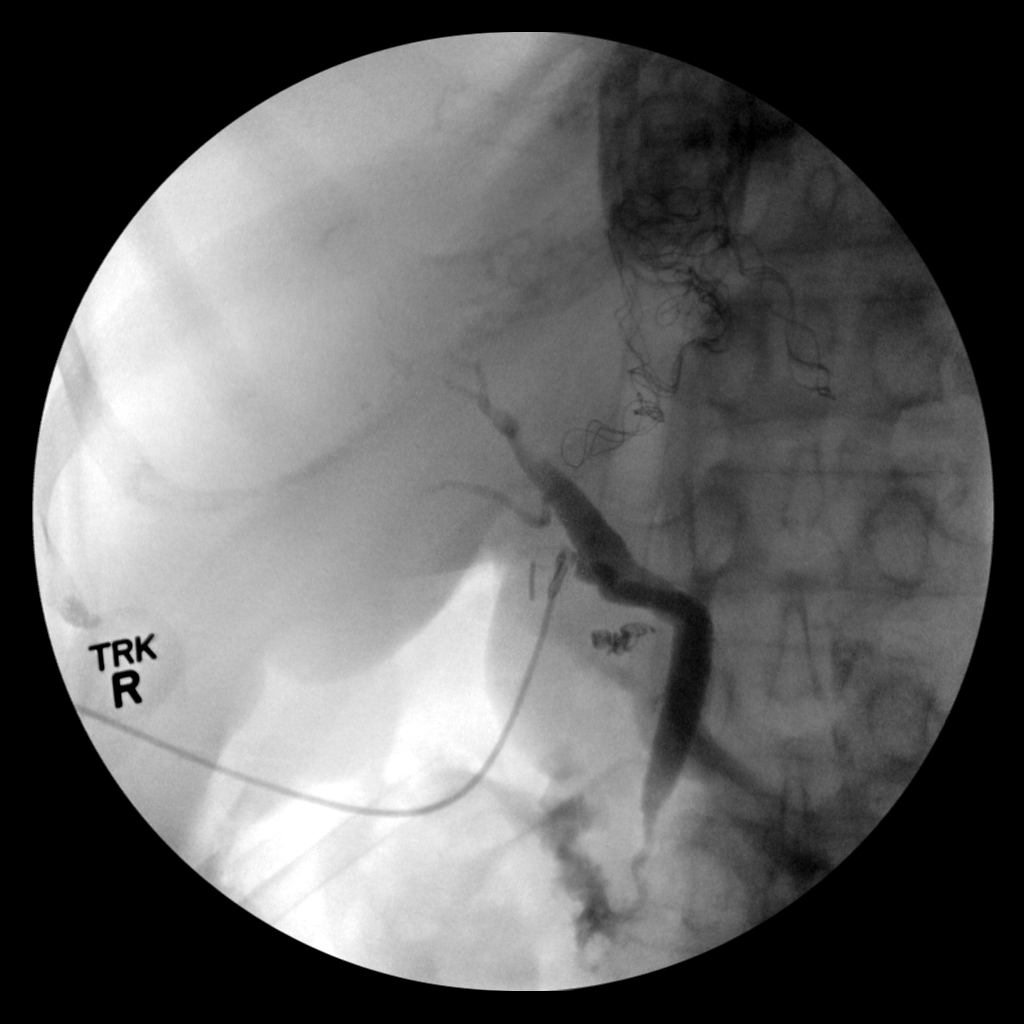
[frame 32/40]
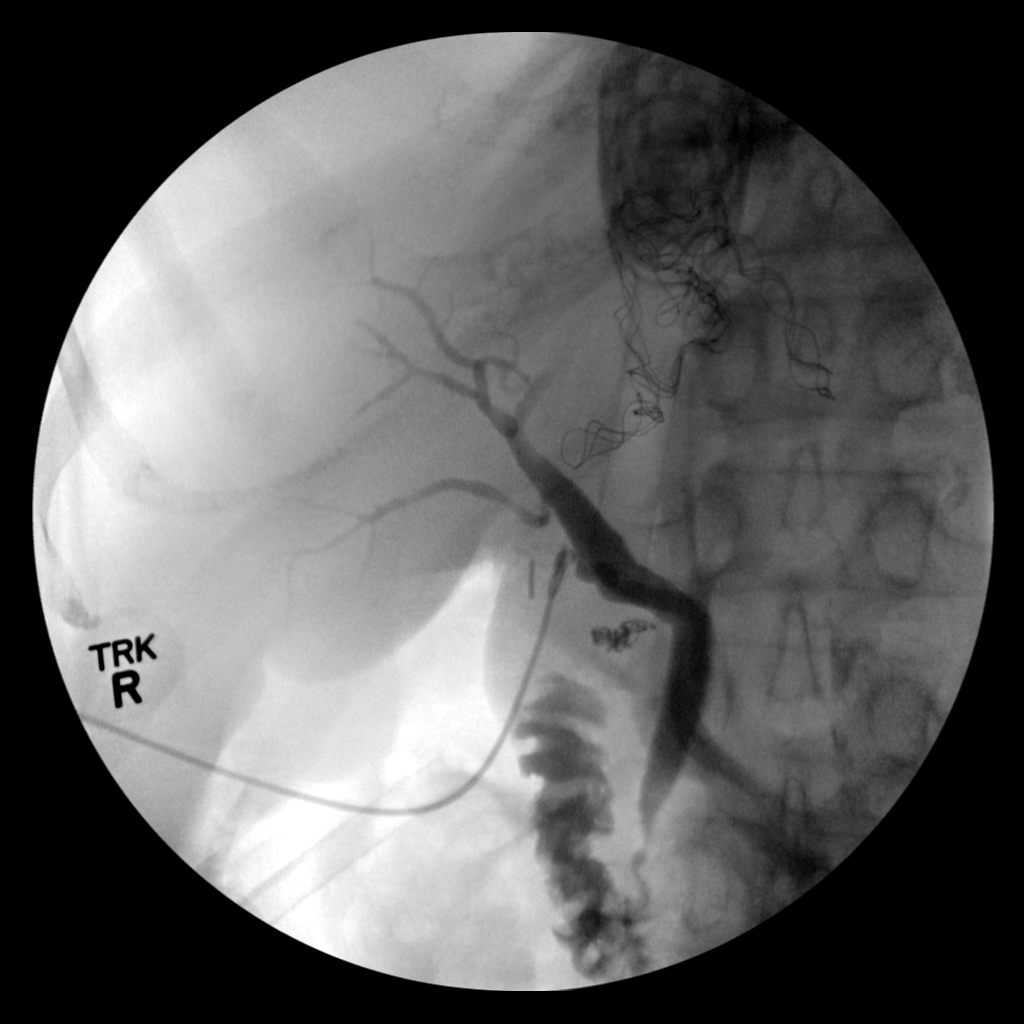
[frame 35/40]
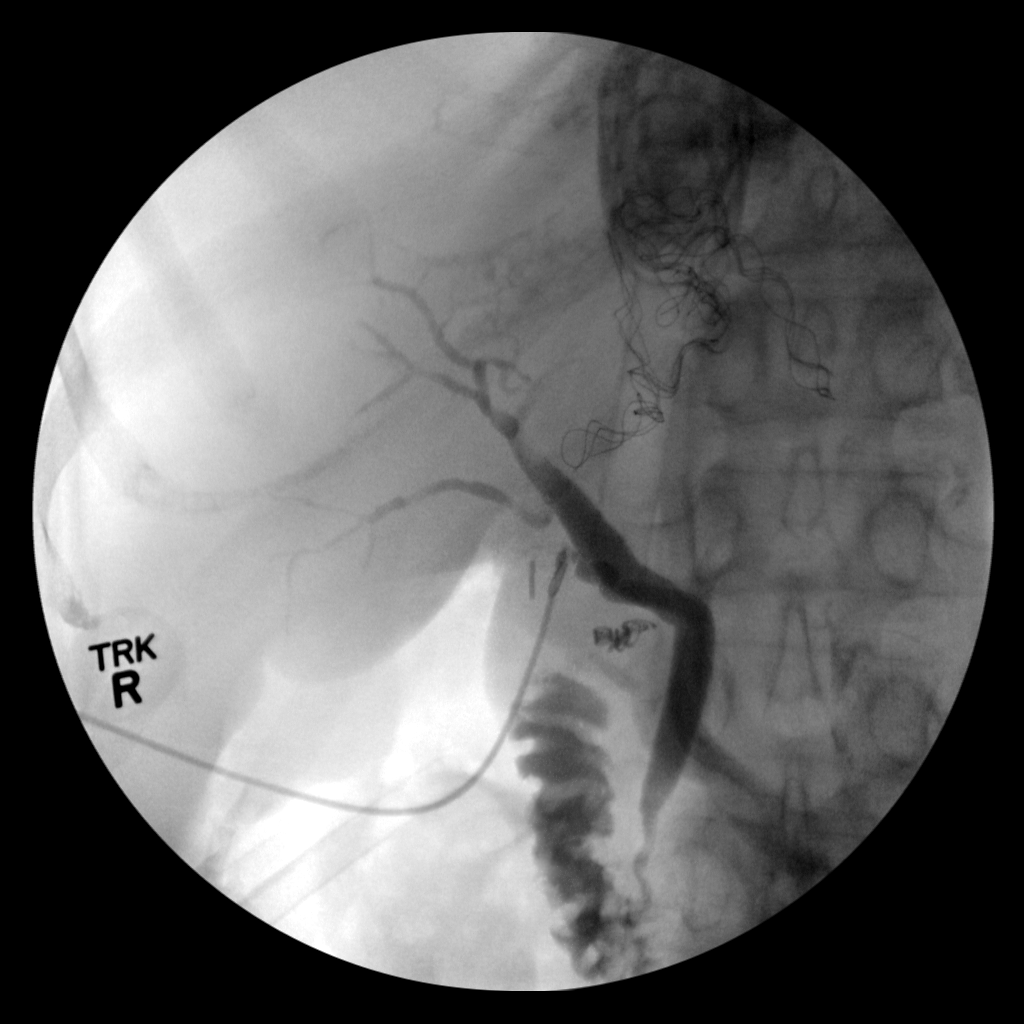

[4 of 4 positions shown; findings below may reference images not displayed]

FINDINGS: No persistent filling defects in the common duct.
Intrahepatic ducts are incompletely visualized, appearing
decompressed centrally. Contrast passes into the duodenum.

IMPRESSION

Negative for retained common duct stone.

## 2013-07-13 DIAGNOSIS — N186 End stage renal disease: Secondary | ICD-10-CM | POA: Diagnosis not present

## 2013-07-15 DIAGNOSIS — N186 End stage renal disease: Secondary | ICD-10-CM | POA: Diagnosis not present

## 2013-07-15 DIAGNOSIS — D509 Iron deficiency anemia, unspecified: Secondary | ICD-10-CM | POA: Diagnosis not present

## 2013-07-15 DIAGNOSIS — Z992 Dependence on renal dialysis: Secondary | ICD-10-CM | POA: Diagnosis not present

## 2013-07-15 DIAGNOSIS — N2581 Secondary hyperparathyroidism of renal origin: Secondary | ICD-10-CM | POA: Diagnosis not present

## 2013-07-15 DIAGNOSIS — D631 Anemia in chronic kidney disease: Secondary | ICD-10-CM | POA: Diagnosis not present

## 2013-07-24 DIAGNOSIS — H2510 Age-related nuclear cataract, unspecified eye: Secondary | ICD-10-CM | POA: Diagnosis not present

## 2013-07-24 DIAGNOSIS — H353290 Exudative age-related macular degeneration, unspecified eye, stage unspecified: Secondary | ICD-10-CM | POA: Diagnosis not present

## 2013-07-24 DIAGNOSIS — H35059 Retinal neovascularization, unspecified, unspecified eye: Secondary | ICD-10-CM | POA: Diagnosis not present

## 2013-07-24 DIAGNOSIS — H353190 Nonexudative age-related macular degeneration, unspecified eye, stage unspecified: Secondary | ICD-10-CM | POA: Diagnosis not present

## 2013-07-26 DIAGNOSIS — N760 Acute vaginitis: Secondary | ICD-10-CM | POA: Diagnosis not present

## 2013-07-26 DIAGNOSIS — R1031 Right lower quadrant pain: Secondary | ICD-10-CM | POA: Diagnosis not present

## 2013-07-26 DIAGNOSIS — R634 Abnormal weight loss: Secondary | ICD-10-CM | POA: Diagnosis not present

## 2013-07-28 IMAGING — CR DG ABDOMEN 2V
2 series · 2 of 2 positions shown · non-contrast
Comparison: Single view of the chest 11/29/2012.

CLINICAL DATA: Abdominal pain and distention.  Status post
cholecystectomy 12/02/2012.

ABDOMEN - 2 VIEW

[w abdomen upright]
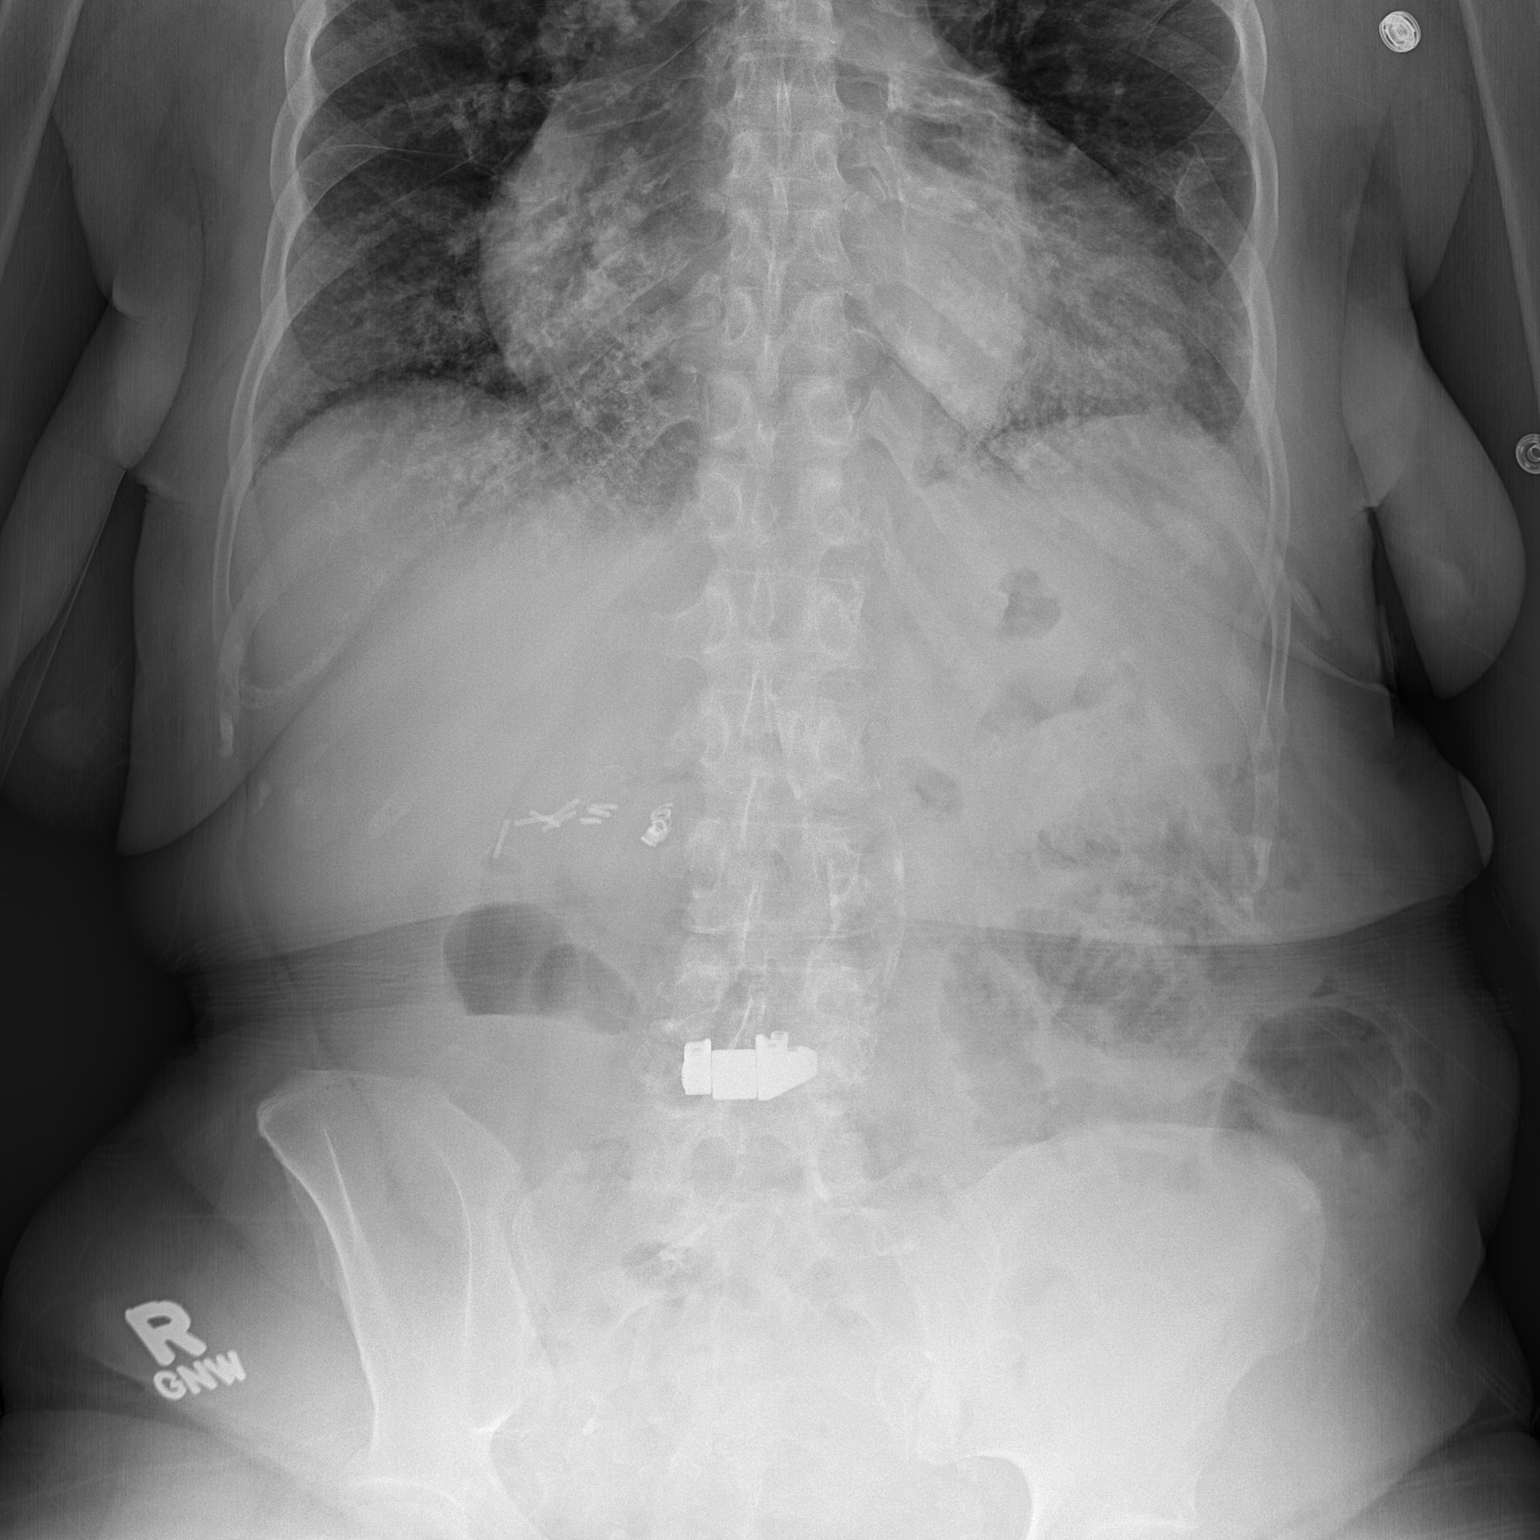

[t abdomen supine]
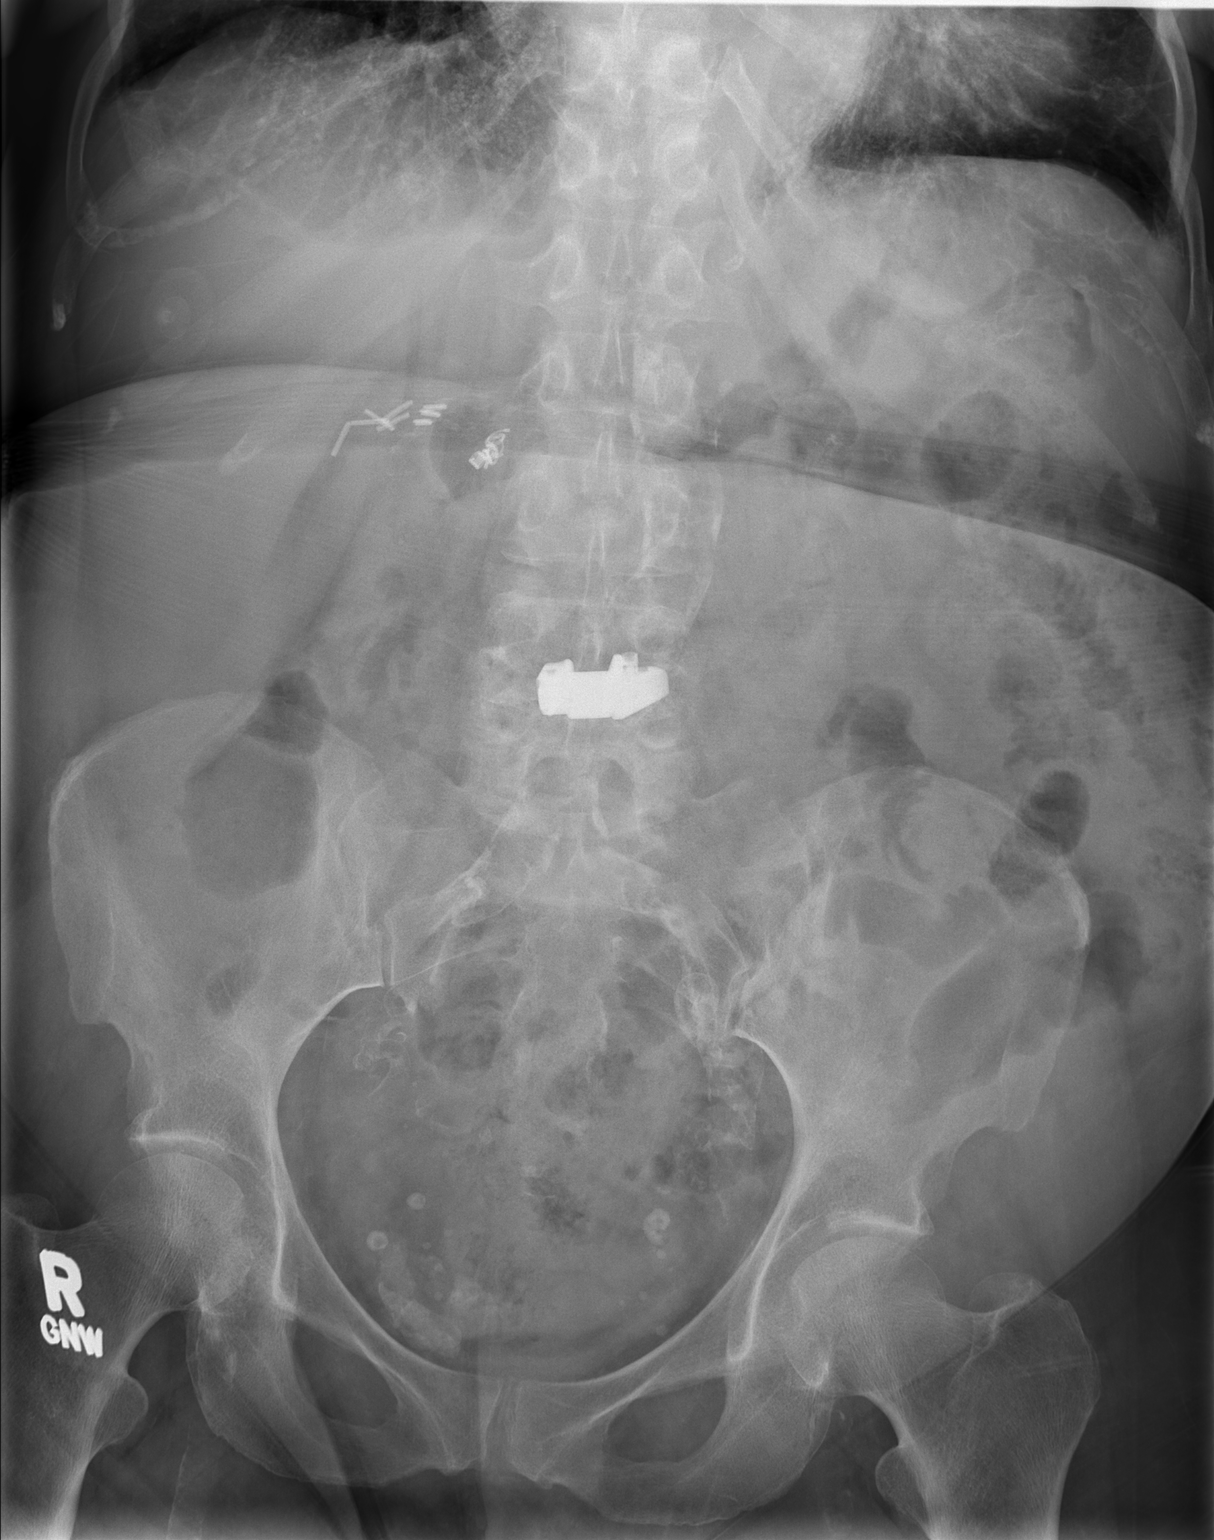

[2 of 2 positions shown; findings below may reference images not displayed]

FINDINGS: The bowel gas pattern is unremarkable.  No free
intraperitoneal air is identified.  Cholecystectomy clips are
noted.  Postoperative change lower lumbar spine is seen.  There is
partial visualization of cardiomegaly and bibasilar atelectasis.
IMPRESSION: No acute finding.

## 2013-08-13 DIAGNOSIS — N186 End stage renal disease: Secondary | ICD-10-CM | POA: Diagnosis not present

## 2013-08-15 DIAGNOSIS — Z992 Dependence on renal dialysis: Secondary | ICD-10-CM | POA: Diagnosis not present

## 2013-08-15 DIAGNOSIS — N186 End stage renal disease: Secondary | ICD-10-CM | POA: Diagnosis not present

## 2013-08-15 DIAGNOSIS — D631 Anemia in chronic kidney disease: Secondary | ICD-10-CM | POA: Diagnosis not present

## 2013-08-15 DIAGNOSIS — D509 Iron deficiency anemia, unspecified: Secondary | ICD-10-CM | POA: Diagnosis not present

## 2013-08-15 DIAGNOSIS — N2581 Secondary hyperparathyroidism of renal origin: Secondary | ICD-10-CM | POA: Diagnosis not present

## 2013-08-25 DIAGNOSIS — M19049 Primary osteoarthritis, unspecified hand: Secondary | ICD-10-CM | POA: Diagnosis not present

## 2013-08-25 DIAGNOSIS — M6530 Trigger finger, unspecified finger: Secondary | ICD-10-CM | POA: Diagnosis not present

## 2013-09-12 DIAGNOSIS — N186 End stage renal disease: Secondary | ICD-10-CM | POA: Diagnosis not present

## 2013-09-14 DIAGNOSIS — D631 Anemia in chronic kidney disease: Secondary | ICD-10-CM | POA: Diagnosis not present

## 2013-09-14 DIAGNOSIS — Z23 Encounter for immunization: Secondary | ICD-10-CM | POA: Diagnosis not present

## 2013-09-14 DIAGNOSIS — N2581 Secondary hyperparathyroidism of renal origin: Secondary | ICD-10-CM | POA: Diagnosis not present

## 2013-09-14 DIAGNOSIS — N186 End stage renal disease: Secondary | ICD-10-CM | POA: Diagnosis not present

## 2013-09-14 DIAGNOSIS — D509 Iron deficiency anemia, unspecified: Secondary | ICD-10-CM | POA: Diagnosis not present

## 2013-09-14 DIAGNOSIS — Z992 Dependence on renal dialysis: Secondary | ICD-10-CM | POA: Diagnosis not present

## 2013-09-15 DIAGNOSIS — M6530 Trigger finger, unspecified finger: Secondary | ICD-10-CM | POA: Diagnosis not present

## 2013-10-06 DIAGNOSIS — M6530 Trigger finger, unspecified finger: Secondary | ICD-10-CM | POA: Diagnosis not present

## 2013-10-12 DIAGNOSIS — H2510 Age-related nuclear cataract, unspecified eye: Secondary | ICD-10-CM | POA: Diagnosis not present

## 2013-10-12 DIAGNOSIS — H35059 Retinal neovascularization, unspecified, unspecified eye: Secondary | ICD-10-CM | POA: Diagnosis not present

## 2013-10-12 DIAGNOSIS — H353290 Exudative age-related macular degeneration, unspecified eye, stage unspecified: Secondary | ICD-10-CM | POA: Diagnosis not present

## 2013-10-12 DIAGNOSIS — H353190 Nonexudative age-related macular degeneration, unspecified eye, stage unspecified: Secondary | ICD-10-CM | POA: Diagnosis not present

## 2013-10-13 DIAGNOSIS — N186 End stage renal disease: Secondary | ICD-10-CM | POA: Diagnosis not present

## 2013-10-14 DIAGNOSIS — N186 End stage renal disease: Secondary | ICD-10-CM | POA: Diagnosis not present

## 2013-10-14 DIAGNOSIS — N2581 Secondary hyperparathyroidism of renal origin: Secondary | ICD-10-CM | POA: Diagnosis not present

## 2013-10-14 DIAGNOSIS — D631 Anemia in chronic kidney disease: Secondary | ICD-10-CM | POA: Diagnosis not present

## 2013-10-14 DIAGNOSIS — Z992 Dependence on renal dialysis: Secondary | ICD-10-CM | POA: Diagnosis not present

## 2013-10-14 DIAGNOSIS — D509 Iron deficiency anemia, unspecified: Secondary | ICD-10-CM | POA: Diagnosis not present

## 2013-10-25 ENCOUNTER — Telehealth: Payer: Self-pay | Admitting: Internal Medicine

## 2013-10-25 ENCOUNTER — Encounter: Payer: Self-pay | Admitting: Internal Medicine

## 2013-10-25 NOTE — Telephone Encounter (Signed)
error 

## 2013-11-08 IMAGING — CT CT ANGIO CHEST
2 of 6 series · 18 of 46 positions shown · IV contrast (omnipaque)
Comparison: Chest radiograph performed 03/31/2013, and CT of the
chest performed 05/17/2008

CLINICAL DATA: Shortness of breath.  Patient on dialysis.  Concern
for pulmonary embolus.

CT ANGIOGRAPHY CHEST
TECHNIQUE: Multidetector CT imaging of the chest using the
standard protocol during bolus administration of intravenous
contrast. Multiplanar reconstructed images including MIPs were
obtained and reviewed to evaluate the vascular anatomy.
Contrast: 100mL OMNIPAQUE IOHEXOL 350 MG/ML SOLN

[Series 6: pulm embolism 1.0 b25f thin · axial · 0.61mm/px · z∈[-266,-28]mm · 15 of 262 slices shown]
[im 12/262  lung]
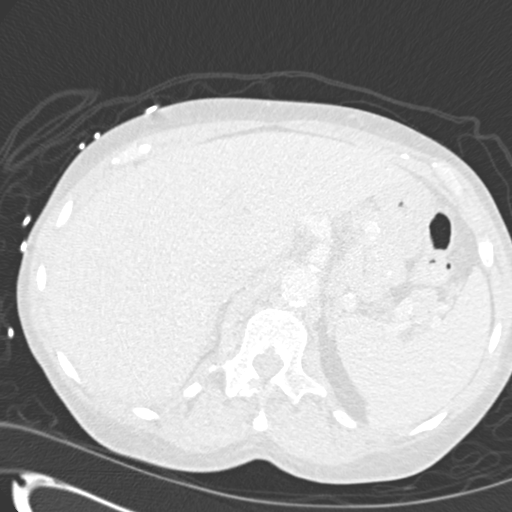
[im 35/262  soft-tissue]
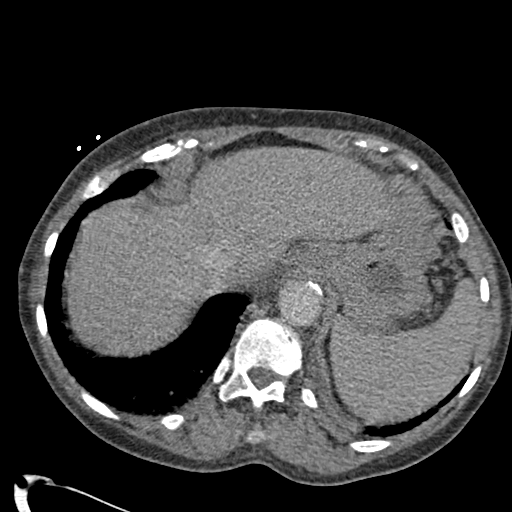
[im 46/262  lung]
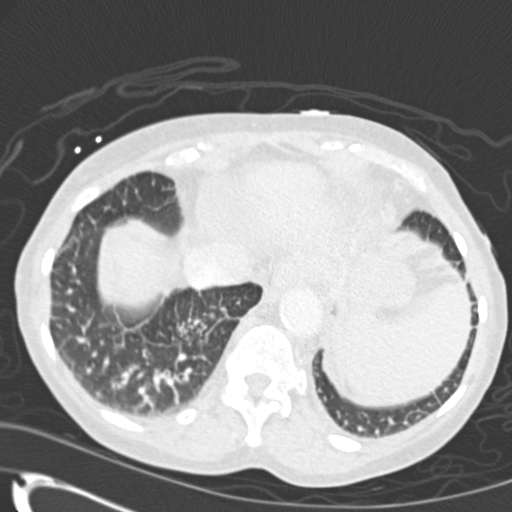
[im 69/262  soft-tissue]
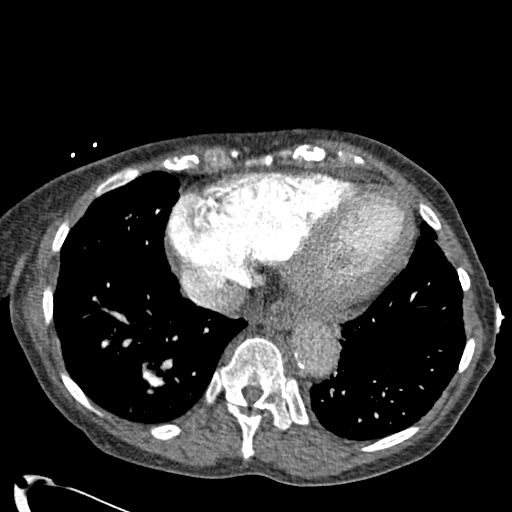
[im 80/262  lung]
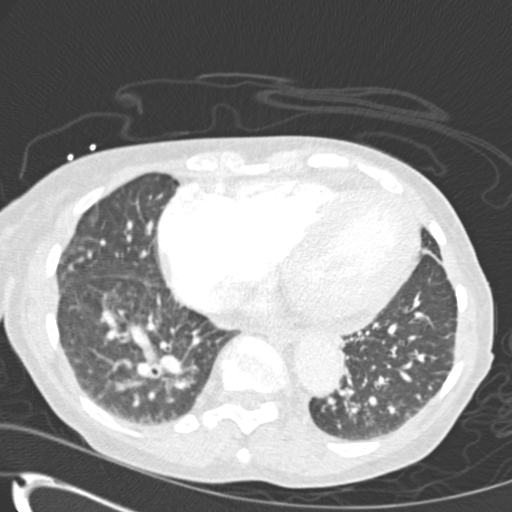
[im 103/262  soft-tissue]
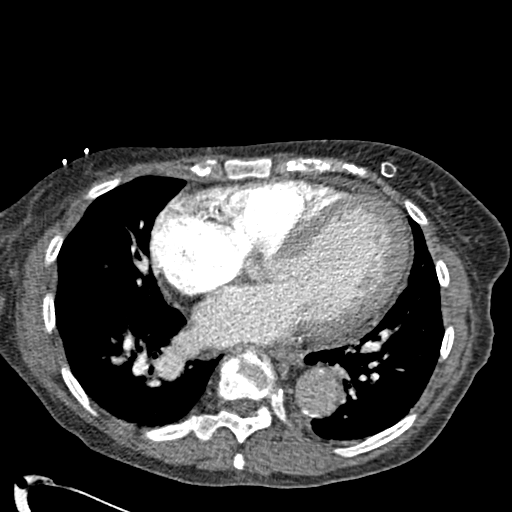
[im 114/262  lung]
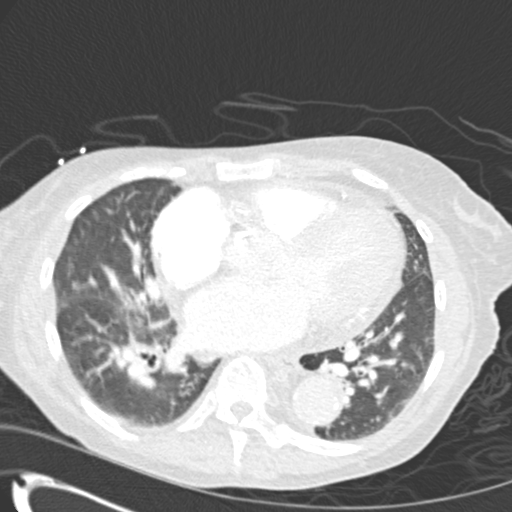
[im 137/262  soft-tissue]
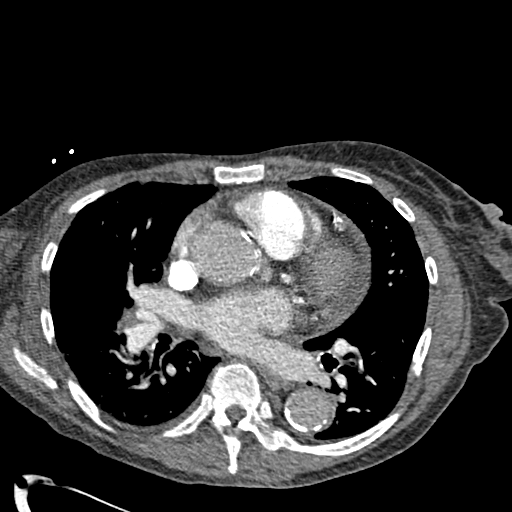
[im 148/262  lung]
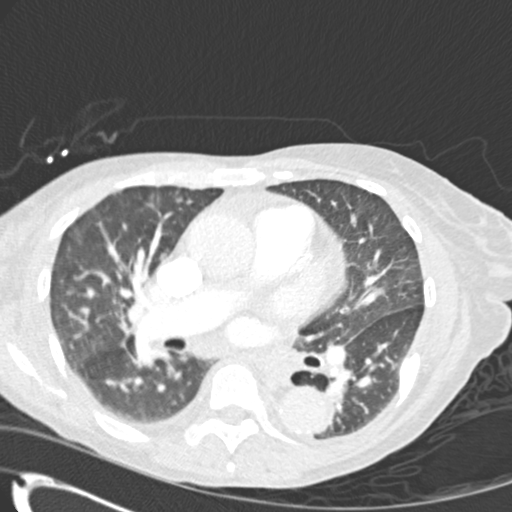
[im 159/262  soft-tissue]
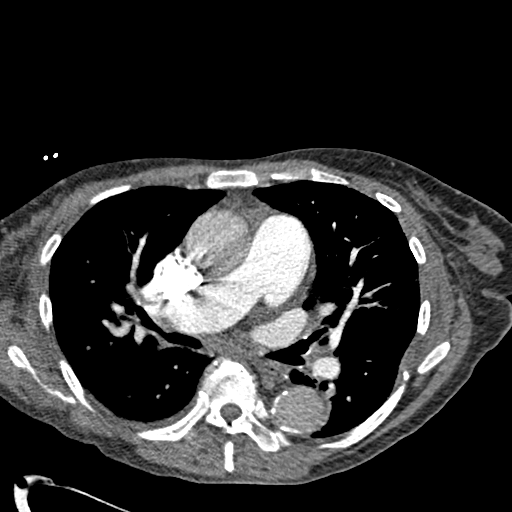
[im 182/262  lung]
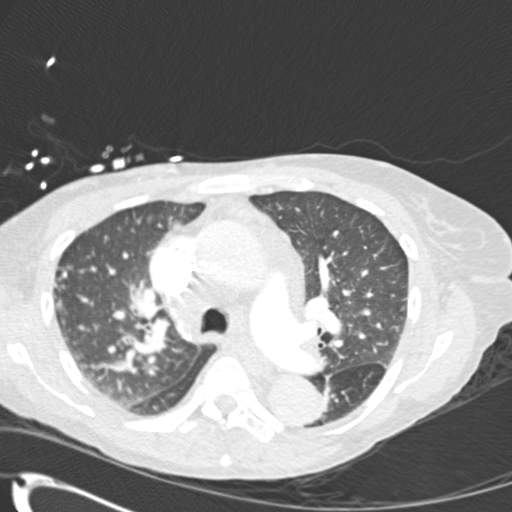
[im 193/262  soft-tissue]
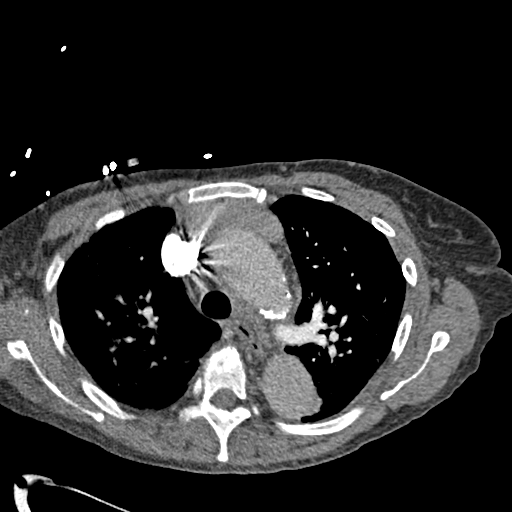
[im 216/262  lung]
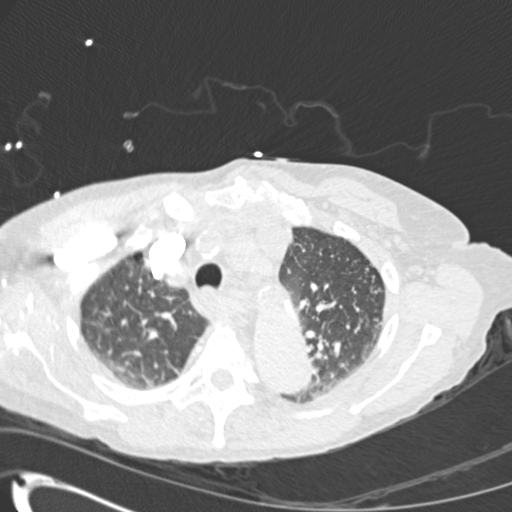
[im 227/262  soft-tissue]
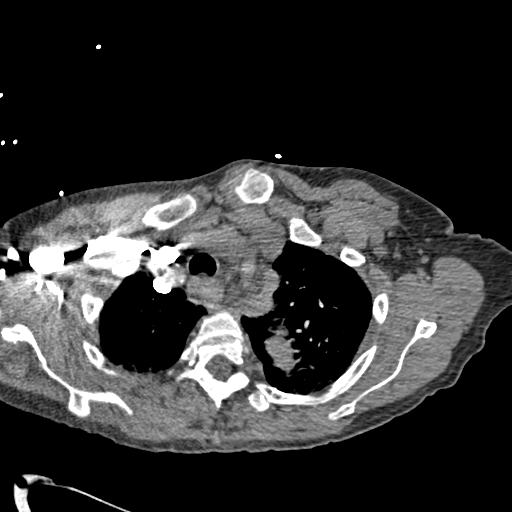
[im 250/262  lung]
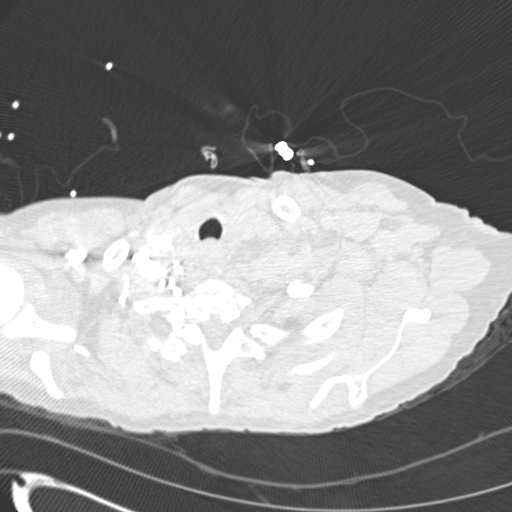

[Series 602: coronal · coronal · 0.61mm/px · 3 of 96 slices shown]
[im 24/96  soft-tissue]
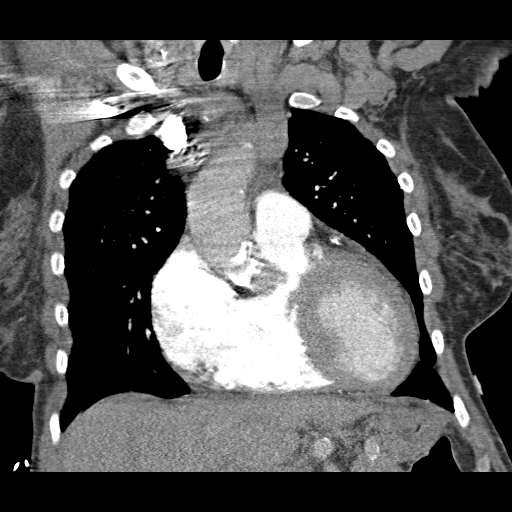
[im 48/96  soft-tissue]
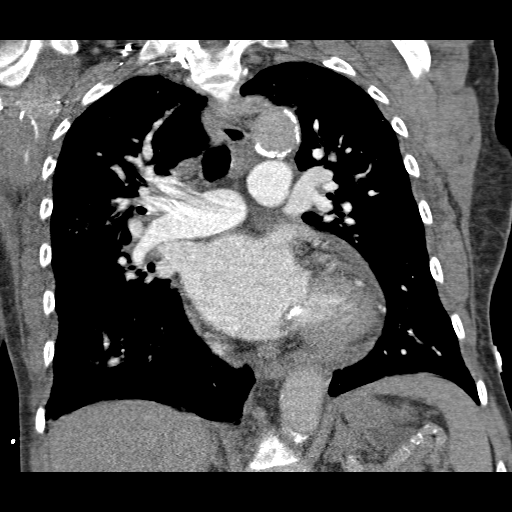
[im 72/96  soft-tissue]
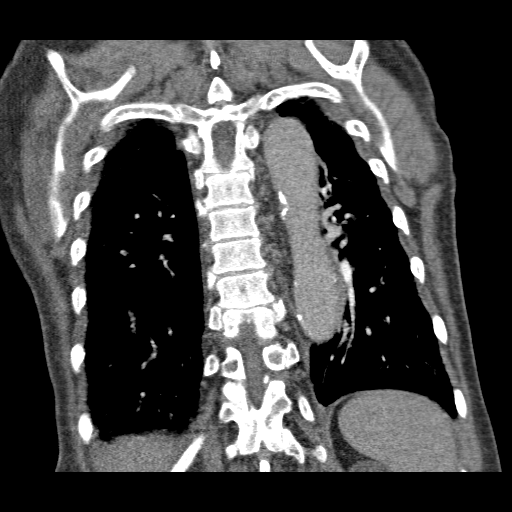

[18 of 46 positions shown; findings below may reference images not displayed]

FINDINGS: There is no evidence of pulmonary embolus.

There is mild interstitial prominence at the lower lung lobes and
at the lung apices, with scattered nonspecific small peripheral
nodular densities seen in both lungs.  Much of this likely reflects
mild interstitial edema, though a superimposed infectious process
cannot be entirely excluded.

There is no evidence of pleural effusion or pneumothorax.

The heart is mildly enlarged.  Trace pericardial fluid remains
within normal limits.  No definite mediastinal lymphadenopathy is
seen.  Diffuse coronary artery calcifications are noted.  The great
vessels are somewhat tortuous but grossly unremarkable in
appearance, though difficult to fully assess due to the phase of
contrast enhancement.  Scattered calcification is noted along the
aortic arch and descending thoracic aorta.  No axillary
lymphadenopathy is seen.  The thyroid gland is not well assessed.

The visualized portions of the liver and spleen are unremarkable.
Cysts are seen about the markedly atrophic left kidney.

No acute osseous abnormalities are seen.  There is chronic
deformity of the left posterolateral seventh rib.  There appears to
be chronic partial dislocation of the left sternoclavicular joint,
though much of this may reflect degenerative change; this is
unchanged from 8998.
IMPRESSION: 1.  No evidence of pulmonary embolus.
2.  Mild interstitial prominence within the lower lung lobes and at
the lung apices, with scattered nonspecific small peripheral
nodular densities in both lungs.  Much of this likely reflects mild
interstitial edema, though superimposed infectious process cannot
be entirely excluded.
3.  Mild cardiomegaly.
4.  Diffuse coronary artery calcifications seen.

5.  Left renal atrophy, with associated cysts.  The right kidney is
not imaged on this study.
6.  Chronic partial dislocation of the left sternoclavicular joint,
though much of this may reflect degenerative change; this is
unchanged from 8998.

## 2013-11-12 DIAGNOSIS — N186 End stage renal disease: Secondary | ICD-10-CM | POA: Diagnosis not present

## 2013-11-14 DIAGNOSIS — Z992 Dependence on renal dialysis: Secondary | ICD-10-CM | POA: Diagnosis not present

## 2013-11-14 DIAGNOSIS — N2581 Secondary hyperparathyroidism of renal origin: Secondary | ICD-10-CM | POA: Diagnosis not present

## 2013-11-14 DIAGNOSIS — D631 Anemia in chronic kidney disease: Secondary | ICD-10-CM | POA: Diagnosis not present

## 2013-11-14 DIAGNOSIS — D509 Iron deficiency anemia, unspecified: Secondary | ICD-10-CM | POA: Diagnosis not present

## 2013-11-14 DIAGNOSIS — N186 End stage renal disease: Secondary | ICD-10-CM | POA: Diagnosis not present

## 2013-11-16 DIAGNOSIS — D631 Anemia in chronic kidney disease: Secondary | ICD-10-CM | POA: Diagnosis not present

## 2013-11-16 DIAGNOSIS — N186 End stage renal disease: Secondary | ICD-10-CM | POA: Diagnosis not present

## 2013-11-16 DIAGNOSIS — N2581 Secondary hyperparathyroidism of renal origin: Secondary | ICD-10-CM | POA: Diagnosis not present

## 2013-11-16 DIAGNOSIS — Z992 Dependence on renal dialysis: Secondary | ICD-10-CM | POA: Diagnosis not present

## 2013-11-16 DIAGNOSIS — D509 Iron deficiency anemia, unspecified: Secondary | ICD-10-CM | POA: Diagnosis not present

## 2013-11-17 DIAGNOSIS — M6530 Trigger finger, unspecified finger: Secondary | ICD-10-CM | POA: Diagnosis not present

## 2013-11-18 DIAGNOSIS — Z992 Dependence on renal dialysis: Secondary | ICD-10-CM | POA: Diagnosis not present

## 2013-11-18 DIAGNOSIS — D631 Anemia in chronic kidney disease: Secondary | ICD-10-CM | POA: Diagnosis not present

## 2013-11-18 DIAGNOSIS — D509 Iron deficiency anemia, unspecified: Secondary | ICD-10-CM | POA: Diagnosis not present

## 2013-11-18 DIAGNOSIS — N2581 Secondary hyperparathyroidism of renal origin: Secondary | ICD-10-CM | POA: Diagnosis not present

## 2013-11-18 DIAGNOSIS — N186 End stage renal disease: Secondary | ICD-10-CM | POA: Diagnosis not present

## 2013-11-21 DIAGNOSIS — D509 Iron deficiency anemia, unspecified: Secondary | ICD-10-CM | POA: Diagnosis not present

## 2013-11-21 DIAGNOSIS — N186 End stage renal disease: Secondary | ICD-10-CM | POA: Diagnosis not present

## 2013-11-21 DIAGNOSIS — Z992 Dependence on renal dialysis: Secondary | ICD-10-CM | POA: Diagnosis not present

## 2013-11-21 DIAGNOSIS — D631 Anemia in chronic kidney disease: Secondary | ICD-10-CM | POA: Diagnosis not present

## 2013-11-21 DIAGNOSIS — N2581 Secondary hyperparathyroidism of renal origin: Secondary | ICD-10-CM | POA: Diagnosis not present

## 2013-11-23 DIAGNOSIS — D631 Anemia in chronic kidney disease: Secondary | ICD-10-CM | POA: Diagnosis not present

## 2013-11-23 DIAGNOSIS — N2581 Secondary hyperparathyroidism of renal origin: Secondary | ICD-10-CM | POA: Diagnosis not present

## 2013-11-23 DIAGNOSIS — N186 End stage renal disease: Secondary | ICD-10-CM | POA: Diagnosis not present

## 2013-11-23 DIAGNOSIS — D509 Iron deficiency anemia, unspecified: Secondary | ICD-10-CM | POA: Diagnosis not present

## 2013-11-23 DIAGNOSIS — Z992 Dependence on renal dialysis: Secondary | ICD-10-CM | POA: Diagnosis not present

## 2013-11-25 DIAGNOSIS — N186 End stage renal disease: Secondary | ICD-10-CM | POA: Diagnosis not present

## 2013-11-25 DIAGNOSIS — D631 Anemia in chronic kidney disease: Secondary | ICD-10-CM | POA: Diagnosis not present

## 2013-11-25 DIAGNOSIS — Z992 Dependence on renal dialysis: Secondary | ICD-10-CM | POA: Diagnosis not present

## 2013-11-25 DIAGNOSIS — N2581 Secondary hyperparathyroidism of renal origin: Secondary | ICD-10-CM | POA: Diagnosis not present

## 2013-11-25 DIAGNOSIS — D509 Iron deficiency anemia, unspecified: Secondary | ICD-10-CM | POA: Diagnosis not present

## 2013-11-28 DIAGNOSIS — Z992 Dependence on renal dialysis: Secondary | ICD-10-CM | POA: Diagnosis not present

## 2013-11-28 DIAGNOSIS — N186 End stage renal disease: Secondary | ICD-10-CM | POA: Diagnosis not present

## 2013-11-28 DIAGNOSIS — N2581 Secondary hyperparathyroidism of renal origin: Secondary | ICD-10-CM | POA: Diagnosis not present

## 2013-11-28 DIAGNOSIS — D509 Iron deficiency anemia, unspecified: Secondary | ICD-10-CM | POA: Diagnosis not present

## 2013-11-28 DIAGNOSIS — D631 Anemia in chronic kidney disease: Secondary | ICD-10-CM | POA: Diagnosis not present

## 2013-11-30 DIAGNOSIS — Z992 Dependence on renal dialysis: Secondary | ICD-10-CM | POA: Diagnosis not present

## 2013-11-30 DIAGNOSIS — D509 Iron deficiency anemia, unspecified: Secondary | ICD-10-CM | POA: Diagnosis not present

## 2013-11-30 DIAGNOSIS — D631 Anemia in chronic kidney disease: Secondary | ICD-10-CM | POA: Diagnosis not present

## 2013-11-30 DIAGNOSIS — N186 End stage renal disease: Secondary | ICD-10-CM | POA: Diagnosis not present

## 2013-11-30 DIAGNOSIS — N2581 Secondary hyperparathyroidism of renal origin: Secondary | ICD-10-CM | POA: Diagnosis not present

## 2013-12-01 ENCOUNTER — Encounter: Payer: Self-pay | Admitting: Cardiology

## 2013-12-02 DIAGNOSIS — D509 Iron deficiency anemia, unspecified: Secondary | ICD-10-CM | POA: Diagnosis not present

## 2013-12-02 DIAGNOSIS — N186 End stage renal disease: Secondary | ICD-10-CM | POA: Diagnosis not present

## 2013-12-02 DIAGNOSIS — N2581 Secondary hyperparathyroidism of renal origin: Secondary | ICD-10-CM | POA: Diagnosis not present

## 2013-12-02 DIAGNOSIS — D631 Anemia in chronic kidney disease: Secondary | ICD-10-CM | POA: Diagnosis not present

## 2013-12-02 DIAGNOSIS — Z992 Dependence on renal dialysis: Secondary | ICD-10-CM | POA: Diagnosis not present

## 2013-12-04 DIAGNOSIS — Z992 Dependence on renal dialysis: Secondary | ICD-10-CM | POA: Diagnosis not present

## 2013-12-04 DIAGNOSIS — N186 End stage renal disease: Secondary | ICD-10-CM | POA: Diagnosis not present

## 2013-12-04 DIAGNOSIS — N2581 Secondary hyperparathyroidism of renal origin: Secondary | ICD-10-CM | POA: Diagnosis not present

## 2013-12-04 DIAGNOSIS — D509 Iron deficiency anemia, unspecified: Secondary | ICD-10-CM | POA: Diagnosis not present

## 2013-12-04 DIAGNOSIS — D631 Anemia in chronic kidney disease: Secondary | ICD-10-CM | POA: Diagnosis not present

## 2013-12-06 DIAGNOSIS — N2581 Secondary hyperparathyroidism of renal origin: Secondary | ICD-10-CM | POA: Diagnosis not present

## 2013-12-06 DIAGNOSIS — D631 Anemia in chronic kidney disease: Secondary | ICD-10-CM | POA: Diagnosis not present

## 2013-12-06 DIAGNOSIS — Z992 Dependence on renal dialysis: Secondary | ICD-10-CM | POA: Diagnosis not present

## 2013-12-06 DIAGNOSIS — N186 End stage renal disease: Secondary | ICD-10-CM | POA: Diagnosis not present

## 2013-12-06 DIAGNOSIS — D509 Iron deficiency anemia, unspecified: Secondary | ICD-10-CM | POA: Diagnosis not present

## 2013-12-09 DIAGNOSIS — N186 End stage renal disease: Secondary | ICD-10-CM | POA: Diagnosis not present

## 2013-12-09 DIAGNOSIS — N2581 Secondary hyperparathyroidism of renal origin: Secondary | ICD-10-CM | POA: Diagnosis not present

## 2013-12-09 DIAGNOSIS — D509 Iron deficiency anemia, unspecified: Secondary | ICD-10-CM | POA: Diagnosis not present

## 2013-12-09 DIAGNOSIS — Z992 Dependence on renal dialysis: Secondary | ICD-10-CM | POA: Diagnosis not present

## 2013-12-09 DIAGNOSIS — D631 Anemia in chronic kidney disease: Secondary | ICD-10-CM | POA: Diagnosis not present

## 2013-12-11 DIAGNOSIS — Z992 Dependence on renal dialysis: Secondary | ICD-10-CM | POA: Diagnosis not present

## 2013-12-11 DIAGNOSIS — N186 End stage renal disease: Secondary | ICD-10-CM | POA: Diagnosis not present

## 2013-12-11 DIAGNOSIS — D509 Iron deficiency anemia, unspecified: Secondary | ICD-10-CM | POA: Diagnosis not present

## 2013-12-11 DIAGNOSIS — D631 Anemia in chronic kidney disease: Secondary | ICD-10-CM | POA: Diagnosis not present

## 2013-12-11 DIAGNOSIS — N2581 Secondary hyperparathyroidism of renal origin: Secondary | ICD-10-CM | POA: Diagnosis not present

## 2013-12-13 DIAGNOSIS — D509 Iron deficiency anemia, unspecified: Secondary | ICD-10-CM | POA: Diagnosis not present

## 2013-12-13 DIAGNOSIS — N186 End stage renal disease: Secondary | ICD-10-CM | POA: Diagnosis not present

## 2013-12-13 DIAGNOSIS — D631 Anemia in chronic kidney disease: Secondary | ICD-10-CM | POA: Diagnosis not present

## 2013-12-13 DIAGNOSIS — N2581 Secondary hyperparathyroidism of renal origin: Secondary | ICD-10-CM | POA: Diagnosis not present

## 2013-12-13 DIAGNOSIS — Z992 Dependence on renal dialysis: Secondary | ICD-10-CM | POA: Diagnosis not present

## 2013-12-15 DIAGNOSIS — M6530 Trigger finger, unspecified finger: Secondary | ICD-10-CM | POA: Diagnosis not present

## 2013-12-16 DIAGNOSIS — Z992 Dependence on renal dialysis: Secondary | ICD-10-CM | POA: Diagnosis not present

## 2013-12-16 DIAGNOSIS — D509 Iron deficiency anemia, unspecified: Secondary | ICD-10-CM | POA: Diagnosis not present

## 2013-12-16 DIAGNOSIS — N039 Chronic nephritic syndrome with unspecified morphologic changes: Secondary | ICD-10-CM | POA: Diagnosis not present

## 2013-12-16 DIAGNOSIS — D631 Anemia in chronic kidney disease: Secondary | ICD-10-CM | POA: Diagnosis not present

## 2013-12-16 DIAGNOSIS — N186 End stage renal disease: Secondary | ICD-10-CM | POA: Diagnosis not present

## 2013-12-16 DIAGNOSIS — N2581 Secondary hyperparathyroidism of renal origin: Secondary | ICD-10-CM | POA: Diagnosis not present

## 2013-12-18 ENCOUNTER — Other Ambulatory Visit: Payer: Self-pay | Admitting: Orthopedic Surgery

## 2013-12-21 DIAGNOSIS — H353290 Exudative age-related macular degeneration, unspecified eye, stage unspecified: Secondary | ICD-10-CM | POA: Diagnosis not present

## 2013-12-21 NOTE — Progress Notes (Signed)
Pt states this is cancelled-she will call office again

## 2013-12-22 DIAGNOSIS — Z1231 Encounter for screening mammogram for malignant neoplasm of breast: Secondary | ICD-10-CM | POA: Diagnosis not present

## 2013-12-25 DIAGNOSIS — H2510 Age-related nuclear cataract, unspecified eye: Secondary | ICD-10-CM | POA: Diagnosis not present

## 2013-12-25 DIAGNOSIS — H353290 Exudative age-related macular degeneration, unspecified eye, stage unspecified: Secondary | ICD-10-CM | POA: Diagnosis not present

## 2013-12-25 DIAGNOSIS — H40019 Open angle with borderline findings, low risk, unspecified eye: Secondary | ICD-10-CM | POA: Diagnosis not present

## 2013-12-27 ENCOUNTER — Ambulatory Visit (AMBULATORY_SURGERY_CENTER): Payer: Self-pay | Admitting: *Deleted

## 2013-12-27 ENCOUNTER — Encounter (HOSPITAL_BASED_OUTPATIENT_CLINIC_OR_DEPARTMENT_OTHER): Admission: RE | Payer: Self-pay | Source: Ambulatory Visit

## 2013-12-27 ENCOUNTER — Ambulatory Visit (HOSPITAL_BASED_OUTPATIENT_CLINIC_OR_DEPARTMENT_OTHER): Admission: RE | Admit: 2013-12-27 | Payer: Medicare Other | Source: Ambulatory Visit | Admitting: Orthopedic Surgery

## 2013-12-27 VITALS — Ht 62.0 in | Wt 123.8 lb

## 2013-12-27 DIAGNOSIS — Z860100 Personal history of colon polyps, unspecified: Secondary | ICD-10-CM

## 2013-12-27 DIAGNOSIS — Z800 Family history of malignant neoplasm of digestive organs: Secondary | ICD-10-CM

## 2013-12-27 SURGERY — RELEASE, A1 PULLEY, FOR TRIGGER FINGER
Anesthesia: Regional | Site: Thumb | Laterality: Right

## 2013-12-27 MED ORDER — MOVIPREP 100 G PO SOLR: 1.0000 | Freq: Once | ORAL | Status: DC

## 2013-12-27 NOTE — Progress Notes (Signed)
Denies allergies to eggs or soy products. Denies complications with sedation or anesthesia. 

## 2014-01-05 DIAGNOSIS — H353290 Exudative age-related macular degeneration, unspecified eye, stage unspecified: Secondary | ICD-10-CM | POA: Diagnosis not present

## 2014-01-10 ENCOUNTER — Ambulatory Visit (AMBULATORY_SURGERY_CENTER): Payer: Medicare Other | Admitting: Internal Medicine

## 2014-01-10 ENCOUNTER — Encounter: Payer: Self-pay | Admitting: Internal Medicine

## 2014-01-10 VITALS — BP 163/88 | HR 74 | Temp 97.9°F | Resp 16 | Ht 62.0 in | Wt 123.0 lb

## 2014-01-10 DIAGNOSIS — I10 Essential (primary) hypertension: Secondary | ICD-10-CM | POA: Diagnosis not present

## 2014-01-10 DIAGNOSIS — Z992 Dependence on renal dialysis: Secondary | ICD-10-CM | POA: Diagnosis not present

## 2014-01-10 DIAGNOSIS — N186 End stage renal disease: Secondary | ICD-10-CM | POA: Diagnosis not present

## 2014-01-10 DIAGNOSIS — K219 Gastro-esophageal reflux disease without esophagitis: Secondary | ICD-10-CM | POA: Diagnosis not present

## 2014-01-10 DIAGNOSIS — Z860100 Personal history of colon polyps, unspecified: Secondary | ICD-10-CM

## 2014-01-10 DIAGNOSIS — D649 Anemia, unspecified: Secondary | ICD-10-CM | POA: Diagnosis not present

## 2014-01-10 DIAGNOSIS — D126 Benign neoplasm of colon, unspecified: Secondary | ICD-10-CM

## 2014-01-10 MED ORDER — SODIUM CHLORIDE 0.9 % IV SOLN: 500.0000 mL | INTRAVENOUS | Status: DC

## 2014-01-10 NOTE — Progress Notes (Signed)
Called to room to assist during endoscopic procedure.  Patient ID and intended procedure confirmed with present staff. Received instructions for my participation in the procedure from the performing physician.  

## 2014-01-10 NOTE — Progress Notes (Signed)
Noted arrival to procedure room IVF infused = 450 cc Total given with anesthesia course = 200 cc Slow KVO rate leaving procedure room   Stable to RR

## 2014-01-10 NOTE — Op Note (Signed)
[place]  [address] [place], [address]   COLONOSCOPY PROCEDURE REPORT  PATIENT: [name], [name]  MR#: [id number] BIRTHDATE: 1951/02/22 , 62  yrs. old GENDER: Female ENDOSCOPIST: Lafayette Dragon, MD REFERRED DI:[id number] [place], M.D. PROCEDURE DATE:  01/10/2014 PROCEDURE:   Colonoscopy with snare polypectomy First Screening Colonoscopy - Avg.  risk and is 50 yrs.  old or older - No.  Prior Negative Screening - Now for repeat screening. N/A  History of Adenoma - Now for follow-up colonoscopy & has been > or = to 3 yrs.  Yes hx of adenoma.  Has been 3 or more years since last colonoscopy.  Polyps Removed Today? Yes. ASA CLASS:   Class III INDICATIONS:Patient's personal history of adenomatous colon polyps and last colonoscopy in November 2011 showed carpeted rectal polyp which was a tubular adenoma with focus of dysplasia.  Repeat sigmoidoscopy in August 2012 no polyp. MEDICATIONS: MAC sedation, administered by CRNA and propofol (Diprivan) 200mg  IV  DESCRIPTION OF PROCEDURE:   After the risks benefits and alternatives of the procedure were thoroughly explained, informed consent was obtained.  A digital rectal exam revealed no abnormalities of the rectum.   The LB [id number] and LB [id number]  endoscope was introduced through the anus and advanced to the cecum, which was identified by both the appendix and ileocecal valve. No adverse events experienced.   The quality of the prep was excellent.  The instrument was then slowly withdrawn as the colon was fully examined.      COLON FINDINGS: A polypoid shaped sessile polyp ranging between 5-23mm in size was found at the cecum.  A polypectomy was performed using snare cautery.  The resection was complete and the polyp tissue was completely retrieved.   There was mild diverticulosis noted in the sigmoid colon with associated muscular hypertrophy. Retroflexed views revealed no abnormalities. The  time to cecum=5 minutes 08 seconds.  Withdrawal time=6 minutes 02 seconds.  The scope was withdrawn and the procedure completed. COMPLICATIONS: There were no complications.  ENDOSCOPIC IMPRESSION: 1.   Sessile polyp ranging between 5-17mm in size was found at the cecum; polypectomy was performed using snare cautery 2.   There was mild diverticulosis noted in the sigmoid colon 3 . no recurrence of rectal polyp RECOMMENDATIONS: 1.  Await pathology results 2.  recall colonoscopy pending path report   eSigned:  Lafayette Dragon, MD 01/10/2014 12:29 PM   cc:   PATIENT NAME:  [name], [name] MR#: [id number]

## 2014-01-10 NOTE — Patient Instructions (Addendum)
YOU HAD AN ENDOSCOPIC PROCEDURE TODAY AT THE [facility]: Refer to the procedure report that was given to you for any specific questions about what was found during the examination.  If the procedure report does not answer your questions, please call your gastroenterologist to clarify.  If you requested that your care partner not be given the details of your procedure findings, then the procedure report has been included in a sealed envelope for you to review at your convenience later.  YOU SHOULD EXPECT: Some feelings of bloating in the abdomen. Passage of more gas than usual.  Walking can help get rid of the air that was put into your GI tract during the procedure and reduce the bloating. If you had a lower endoscopy (such as a colonoscopy or flexible sigmoidoscopy) you may notice spotting of blood in your stool or on the toilet paper. If you underwent a bowel prep for your procedure, then you may not have a normal bowel movement for a few days.  DIET: Your first meal following the procedure should be a light meal and then it is ok to progress to your normal diet.  A half-sandwich or bowl of soup is an example of a good first meal.  Heavy or fried foods are harder to digest and may make you feel nauseous or bloated.  Likewise meals heavy in dairy and vegetables can cause extra gas to form and this can also increase the bloating.  Drink plenty of fluids but you should avoid alcoholic beverages for 24 hours.  ACTIVITY: Your care partner should take you home directly after the procedure.  You should plan to take it easy, moving slowly for the rest of the day.  You can resume normal activity the day after the procedure however you should NOT DRIVE or use heavy machinery for 24 hours (because of the sedation medicines used during the test).    SYMPTOMS TO REPORT IMMEDIATELY: A gastroenterologist can be reached at any hour.  During normal business hours, 8:30 AM to 5:00 PM Monday through Friday,  call [phone].  After hours and on weekends, please call the GI answering service at [phone] who will take a message and have the physician on call contact you.   Following lower endoscopy (colonoscopy or flexible sigmoidoscopy):  Excessive amounts of blood in the stool  Significant tenderness or worsening of abdominal pains  Swelling of the abdomen that is new, acute  Fever of 100F or higher  FOLLOW UP: If any biopsies were taken you will be contacted by phone or by letter within the next 1-3 weeks.  Call your gastroenterologist if you have not heard about the biopsies in 3 weeks.  Our staff will call the home number listed on your records the next business day following your procedure to check on you and address any questions or concerns that you may have at that time regarding the information given to you following your procedure. This is a courtesy call and so if there is no answer at the home number and we have not heard from you through the emergency physician on call, we will assume that you have returned to your regular daily activities without incident.  SIGNATURES/CONFIDENTIALITY: You and/or your care partner have signed paperwork which will be entered into your electronic medical record.  These signatures attest to the fact that that the information above on your After Visit Summary has been reviewed and is understood.  Full responsibility of the confidentiality of this   discharge information lies with you and/or your care-partner.  Polyp, diverticulosis-handouts given  Repeat colonoscopy will be determined by pathology.

## 2014-01-11 ENCOUNTER — Telehealth: Payer: Self-pay | Admitting: *Deleted

## 2014-01-11 NOTE — Telephone Encounter (Signed)
  Follow up Call-  Call back number 01/10/2014 07/23/2011  Post procedure Call Back phone  # [phone] or cell [phone]  Permission to leave phone message Yes -     Patient questions:  Do you have a fever, pain , or abdominal swelling? no Pain Score  0 *  Have you tolerated food without any problems? yes  Have you been able to return to your normal activities? yes  Do you have any questions about your discharge instructions: Diet   no Medications  no Follow up visit  no  Do you have questions or concerns about your Care? no  Actions: * If pain score is 4 or above: No action needed, pain <4.

## 2014-01-13 DIAGNOSIS — N186 End stage renal disease: Secondary | ICD-10-CM | POA: Diagnosis not present

## 2014-01-16 ENCOUNTER — Encounter: Payer: Self-pay | Admitting: Internal Medicine

## 2014-01-16 DIAGNOSIS — D631 Anemia in chronic kidney disease: Secondary | ICD-10-CM | POA: Diagnosis not present

## 2014-01-16 DIAGNOSIS — Z992 Dependence on renal dialysis: Secondary | ICD-10-CM | POA: Diagnosis not present

## 2014-01-16 DIAGNOSIS — N039 Chronic nephritic syndrome with unspecified morphologic changes: Secondary | ICD-10-CM | POA: Diagnosis not present

## 2014-01-16 DIAGNOSIS — D509 Iron deficiency anemia, unspecified: Secondary | ICD-10-CM | POA: Diagnosis not present

## 2014-01-16 DIAGNOSIS — N186 End stage renal disease: Secondary | ICD-10-CM | POA: Diagnosis not present

## 2014-01-16 DIAGNOSIS — N2581 Secondary hyperparathyroidism of renal origin: Secondary | ICD-10-CM | POA: Diagnosis not present

## 2014-01-17 ENCOUNTER — Encounter: Payer: Self-pay | Admitting: *Deleted

## 2014-02-10 DIAGNOSIS — N186 End stage renal disease: Secondary | ICD-10-CM | POA: Diagnosis not present

## 2014-02-13 DIAGNOSIS — N186 End stage renal disease: Secondary | ICD-10-CM | POA: Diagnosis not present

## 2014-02-13 DIAGNOSIS — N039 Chronic nephritic syndrome with unspecified morphologic changes: Secondary | ICD-10-CM | POA: Diagnosis not present

## 2014-02-13 DIAGNOSIS — N2581 Secondary hyperparathyroidism of renal origin: Secondary | ICD-10-CM | POA: Diagnosis not present

## 2014-02-13 DIAGNOSIS — D631 Anemia in chronic kidney disease: Secondary | ICD-10-CM | POA: Diagnosis not present

## 2014-02-13 DIAGNOSIS — Z992 Dependence on renal dialysis: Secondary | ICD-10-CM | POA: Diagnosis not present

## 2014-02-13 DIAGNOSIS — D509 Iron deficiency anemia, unspecified: Secondary | ICD-10-CM | POA: Diagnosis not present

## 2014-02-16 DIAGNOSIS — H43819 Vitreous degeneration, unspecified eye: Secondary | ICD-10-CM | POA: Diagnosis not present

## 2014-02-16 DIAGNOSIS — H353290 Exudative age-related macular degeneration, unspecified eye, stage unspecified: Secondary | ICD-10-CM | POA: Diagnosis not present

## 2014-02-16 DIAGNOSIS — H35059 Retinal neovascularization, unspecified, unspecified eye: Secondary | ICD-10-CM | POA: Diagnosis not present

## 2014-02-16 DIAGNOSIS — H353190 Nonexudative age-related macular degeneration, unspecified eye, stage unspecified: Secondary | ICD-10-CM | POA: Diagnosis not present

## 2014-03-13 DIAGNOSIS — N186 End stage renal disease: Secondary | ICD-10-CM | POA: Diagnosis not present

## 2014-03-15 DIAGNOSIS — Z992 Dependence on renal dialysis: Secondary | ICD-10-CM | POA: Diagnosis not present

## 2014-03-15 DIAGNOSIS — D631 Anemia in chronic kidney disease: Secondary | ICD-10-CM | POA: Diagnosis not present

## 2014-03-15 DIAGNOSIS — D509 Iron deficiency anemia, unspecified: Secondary | ICD-10-CM | POA: Diagnosis not present

## 2014-03-15 DIAGNOSIS — N2581 Secondary hyperparathyroidism of renal origin: Secondary | ICD-10-CM | POA: Diagnosis not present

## 2014-03-15 DIAGNOSIS — N186 End stage renal disease: Secondary | ICD-10-CM | POA: Diagnosis not present

## 2014-03-19 DIAGNOSIS — N760 Acute vaginitis: Secondary | ICD-10-CM | POA: Diagnosis not present

## 2014-03-26 ENCOUNTER — Telehealth: Payer: Self-pay | Admitting: Physician Assistant

## 2014-03-26 NOTE — Telephone Encounter (Signed)
Dr. Macon Large is her primary doctor.  She has dialysis and the doctor there does most of her care.

## 2014-03-26 NOTE — Telephone Encounter (Signed)
Please call this patient. Who is PCP? Record indicates Dr. Moshe Cipro (nephrologist) as PCP. [facility] indicates that it is Dr. Laney Pastor. If it's Dr. Laney Pastor, please encourage the patient to come in to re-establish care here (we haven't seen her since 2012).

## 2014-03-27 NOTE — Telephone Encounter (Signed)
I have updated the care teams in [facility] and will notify [facility].

## 2014-04-12 DIAGNOSIS — N186 End stage renal disease: Secondary | ICD-10-CM | POA: Diagnosis not present

## 2014-04-13 DIAGNOSIS — H353290 Exudative age-related macular degeneration, unspecified eye, stage unspecified: Secondary | ICD-10-CM | POA: Diagnosis not present

## 2014-04-13 DIAGNOSIS — H35059 Retinal neovascularization, unspecified, unspecified eye: Secondary | ICD-10-CM | POA: Diagnosis not present

## 2014-04-13 DIAGNOSIS — H43819 Vitreous degeneration, unspecified eye: Secondary | ICD-10-CM | POA: Diagnosis not present

## 2014-04-13 DIAGNOSIS — H353190 Nonexudative age-related macular degeneration, unspecified eye, stage unspecified: Secondary | ICD-10-CM | POA: Diagnosis not present

## 2014-04-14 DIAGNOSIS — D631 Anemia in chronic kidney disease: Secondary | ICD-10-CM | POA: Diagnosis not present

## 2014-04-14 DIAGNOSIS — D509 Iron deficiency anemia, unspecified: Secondary | ICD-10-CM | POA: Diagnosis not present

## 2014-04-14 DIAGNOSIS — N2581 Secondary hyperparathyroidism of renal origin: Secondary | ICD-10-CM | POA: Diagnosis not present

## 2014-04-14 DIAGNOSIS — Z992 Dependence on renal dialysis: Secondary | ICD-10-CM | POA: Diagnosis not present

## 2014-04-14 DIAGNOSIS — N186 End stage renal disease: Secondary | ICD-10-CM | POA: Diagnosis not present

## 2014-05-02 ENCOUNTER — Emergency Department (HOSPITAL_COMMUNITY)
Admission: EM | Admit: 2014-05-02 | Discharge: 2014-05-02 | Disposition: A | Discharge status: Home / Self Care | Payer: Medicare Other | Attending: Emergency Medicine | Admitting: Emergency Medicine

## 2014-05-02 ENCOUNTER — Emergency Department (HOSPITAL_COMMUNITY): Payer: Medicare Other

## 2014-05-02 ENCOUNTER — Encounter (HOSPITAL_COMMUNITY): Payer: Self-pay | Admitting: Emergency Medicine

## 2014-05-02 DIAGNOSIS — M549 Dorsalgia, unspecified: Secondary | ICD-10-CM | POA: Diagnosis not present

## 2014-05-02 DIAGNOSIS — R091 Pleurisy: Secondary | ICD-10-CM | POA: Diagnosis not present

## 2014-05-02 DIAGNOSIS — Z87891 Personal history of nicotine dependence: Secondary | ICD-10-CM | POA: Diagnosis not present

## 2014-05-02 DIAGNOSIS — R011 Cardiac murmur, unspecified: Secondary | ICD-10-CM | POA: Insufficient documentation

## 2014-05-02 DIAGNOSIS — N186 End stage renal disease: Secondary | ICD-10-CM | POA: Diagnosis not present

## 2014-05-02 DIAGNOSIS — Z992 Dependence on renal dialysis: Secondary | ICD-10-CM | POA: Diagnosis not present

## 2014-05-02 DIAGNOSIS — Z860100 Personal history of colon polyps, unspecified: Secondary | ICD-10-CM | POA: Insufficient documentation

## 2014-05-02 DIAGNOSIS — M25559 Pain in unspecified hip: Secondary | ICD-10-CM | POA: Insufficient documentation

## 2014-05-02 DIAGNOSIS — I120 Hypertensive chronic kidney disease with stage 5 chronic kidney disease or end stage renal disease: Secondary | ICD-10-CM | POA: Insufficient documentation

## 2014-05-02 DIAGNOSIS — Z79899 Other long term (current) drug therapy: Secondary | ICD-10-CM | POA: Diagnosis not present

## 2014-05-02 DIAGNOSIS — I509 Heart failure, unspecified: Secondary | ICD-10-CM | POA: Insufficient documentation

## 2014-05-02 DIAGNOSIS — K219 Gastro-esophageal reflux disease without esophagitis: Secondary | ICD-10-CM | POA: Insufficient documentation

## 2014-05-02 DIAGNOSIS — R079 Chest pain, unspecified: Secondary | ICD-10-CM | POA: Diagnosis not present

## 2014-05-02 DIAGNOSIS — Z862 Personal history of diseases of the blood and blood-forming organs and certain disorders involving the immune mechanism: Secondary | ICD-10-CM | POA: Diagnosis not present

## 2014-05-02 DIAGNOSIS — R071 Chest pain on breathing: Secondary | ICD-10-CM | POA: Diagnosis not present

## 2014-05-02 DIAGNOSIS — I10 Essential (primary) hypertension: Secondary | ICD-10-CM | POA: Diagnosis not present

## 2014-05-02 DIAGNOSIS — Z8659 Personal history of other mental and behavioral disorders: Secondary | ICD-10-CM | POA: Insufficient documentation

## 2014-05-02 DIAGNOSIS — E785 Hyperlipidemia, unspecified: Secondary | ICD-10-CM | POA: Diagnosis not present

## 2014-05-02 LAB — CBC
HCT: 37 % (ref 36.0–46.0)
Hemoglobin: 11.2 g/dL — ABNORMAL LOW (ref 12.0–15.0)
MCH: 28 pg (ref 26.0–34.0)
MCHC: 30.3 g/dL (ref 30.0–36.0)
MCV: 92.5 fL (ref 78.0–100.0)
PLATELETS: 273 10*3/uL (ref 150–400)
RBC: 4 MIL/uL (ref 3.87–5.11)
RDW: 17.5 % — AB (ref 11.5–15.5)
WBC: 6.4 10*3/uL (ref 4.0–10.5)

## 2014-05-02 LAB — D-DIMER, QUANTITATIVE: D-Dimer, Quant: 3.29 ug/mL-FEU — ABNORMAL HIGH (ref 0.00–0.48)

## 2014-05-02 LAB — BASIC METABOLIC PANEL
BUN: 45 mg/dL — AB (ref 6–23)
CO2: 29 mEq/L (ref 19–32)
Calcium: 10.4 mg/dL (ref 8.4–10.5)
Chloride: 94 mEq/L — ABNORMAL LOW (ref 96–112)
Creatinine, Ser: 5.69 mg/dL — ABNORMAL HIGH (ref 0.50–1.10)
GFR, EST AFRICAN AMERICAN: 8 mL/min — AB (ref >90)
GFR, EST NON AFRICAN AMERICAN: 7 mL/min — AB (ref >90)
Glucose, Bld: 84 mg/dL (ref 70–99)
POTASSIUM: 5.3 meq/L (ref 3.7–5.3)
SODIUM: 140 meq/L (ref 137–147)

## 2014-05-02 LAB — I-STAT TROPONIN, ED: Troponin i, poc: 0 ng/mL (ref 0.00–0.08)

## 2014-05-02 LAB — PRO B NATRIURETIC PEPTIDE: PRO B NATRI PEPTIDE: 20969 pg/mL — AB (ref 0–125)

## 2014-05-02 MED ORDER — IOHEXOL 350 MG/ML SOLN
100.0000 mL | Freq: Once | INTRAVENOUS | Status: AC | PRN
  Administered 2014-05-02: 100 mL via INTRAVENOUS

## 2014-05-02 MED ORDER — ONDANSETRON HCL 4 MG/2ML IJ SOLN
4.0000 mg | Freq: Once | INTRAMUSCULAR | Status: AC
  Administered 2014-05-02: 4 mg via INTRAVENOUS
  Filled 2014-05-02: qty 2

## 2014-05-02 MED ORDER — HYDROCODONE-ACETAMINOPHEN 5-325 MG PO TABS: 2.0000 | ORAL_TABLET | ORAL | Status: DC | PRN

## 2014-05-02 MED ORDER — MORPHINE SULFATE 4 MG/ML IJ SOLN
4.0000 mg | INTRAMUSCULAR | Status: DC | PRN
  Administered 2014-05-02: 4 mg via INTRAVENOUS
  Filled 2014-05-02: qty 1

## 2014-05-02 NOTE — Discharge Instructions (Signed)
Pleurisy °Pleurisy is redness, puffiness (swelling), and soreness (inflammation) of the lining of the lungs. It can be hard to breathe and hurt to breathe. Coughing or deep breathing will make it hurt more. It is often caused by an existing infection or disease.  °HOME CARE °· Only take medicine as told by your doctor. °· Only take antibiotic medicine as directed. Make sure to finish it even if you start to feel better. °GET HELP RIGHT AWAY IF:  °· Your lips, fingernails, or toenails are blue or dark. °· You cough up blood. °· You have a hard time breathing. °· Your pain is not controlled with medicine or it lasts for more than 1 week. °· Your pain spreads (radiates) into your neck, arms, or jaw. °· You are short of breath or wheezing. °· You develop a fever, rash, throw up (vomit), or faint. °MAKE SURE YOU:  °· Understand these instructions. °· Will watch your condition. °· Will get help right away if you are not doing well or get worse. °Document Released: 11/12/2008 Document Revised: 08/02/2013 Document Reviewed: 05/14/2013 °[employer]® Patient Information ©2014 [employer]. ° °

## 2014-05-02 NOTE — ED Provider Notes (Signed)
CSN: [id number]     Arrival date & time 05/02/14  1846 History   First MD Initiated Contact with Patient 05/02/14 2001     Chief Complaint  Patient presents with  . Chest Pain      HPI  Patient presents for evaluation of pain under her left breast and her left chest. This is been present for 2 days. She states it is sharp. It hurts to breathe or move. Does not have pain in her neck back jaw or abdomen. No nausea. Is not short of breath, however she states it is sharp and sudden when she takes a deep breath. Has history of hypertension, end-stage renal disease. She is on Tuesday, Thursday, Saturday dialysis. Dialyzed yesterday. No history of cardiac disease. Is on hormones. Is a smoker. No history of DVT or PE for patient and her family. No prolonged immobilization, cast, splint, fractures, no leg swelling. She has had some pain from her hip down to her leg for the last several months but no swelling. No fever  Past Medical History  Diagnosis Date  . Anxiety   . Blood transfusion   . Depression   . GERD (gastroesophageal reflux disease)   . Hyperlipidemia   . Hypertension   . Neuromuscular disorder     neuropathy hand and legs  . Weight loss, unintentional   . Adenomatous polyp of colon 10/2010  . Diverticula, colon   . Heart murmur   . CHF (congestive heart failure)   . 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  Had deceased donor kidney transplant in 1996.  Had some early rejection then stable function for years, then had slow decline of function and went back on hemodialysis in 2012.  Gets HD TTS schedule at [facility] on [facility] still using L forearm AVF.     Marland Kitchen Anemia in CKD (chronic kidney disease) 11/07/2012   Past Surgical History  Procedure Laterality Date  . Kidney transplant  1996  . Back surgery    . Cervical fusion    . Av fistula placement      for dialysis  . Cholecystectomy  12/02/2012     Procedure: LAPAROSCOPIC CHOLECYSTECTOMY WITH INTRAOPERATIVE CHOLANGIOGRAM;  Surgeon: Edward Jolly, MD;  Location: [facility] OR;  Service: General;  Laterality: N/A;   Family History  Problem Relation Age of Onset  . Colon cancer Brother   . Coronary artery disease Mother 58  . Esophageal cancer Neg Hx   . Stomach cancer Neg Hx   . Rectal cancer Neg Hx    History  Substance Use Topics  . Smoking status: Former Smoker    Types: Cigarettes    Quit date: 12/31/1991  . Smokeless tobacco: Never Used  . Alcohol Use: No   OB History   Grav Para Term Preterm Abortions TAB SAB Ect Mult Living                 Review of Systems  Constitutional: Negative for fever, chills, diaphoresis, appetite change and fatigue.  HENT: Negative for mouth sores, sore throat and trouble swallowing.   Eyes: Negative for visual disturbance.  Respiratory: Negative for cough, chest tightness, shortness of breath and wheezing.   Cardiovascular: Positive for chest pain.  Gastrointestinal: Negative for nausea, vomiting, abdominal pain, diarrhea and abdominal distention.  Endocrine: Negative for polydipsia, polyphagia and polyuria.  Genitourinary: Negative for dysuria, frequency and hematuria.  Musculoskeletal: Positive  for back pain. Negative for gait problem.       Pain down the posterior aspect of her left leg. No swelling to the leg.  Skin: Negative for color change, pallor and rash.  Neurological: Negative for dizziness, syncope, light-headedness and headaches.  Hematological: Does not bruise/bleed easily.  Psychiatric/Behavioral: Negative for behavioral problems and confusion.      Allergies  Sulfa antibiotics  Home Medications   Prior to Admission medications   Medication Sig Start Date End Date Taking? Authorizing Provider  allopurinol (ZYLOPRIM) 300 MG tablet Take 150 mg by mouth at bedtime.   Yes Historical Provider, MD  calcium carbonate (TUMS - DOSED IN MG ELEMENTAL CALCIUM) 500 MG  chewable tablet Chew 1 tablet by mouth 3 (three) times daily as needed. For acid   Yes Historical Provider, MD  doxazosin (CARDURA) 8 MG tablet Take 8 mg by mouth daily as needed (only when BP is over 180).    Yes Historical Provider, MD  labetalol (NORMODYNE) 300 MG tablet Take 300 mg by mouth 2 (two) times daily. 11/09/12  Yes Velvet Bathe, MD  losartan (COZAAR) 100 MG tablet Take 100 mg by mouth at bedtime.    Yes Historical Provider, MD  Multiple Vitamin (MULTIVITAMIN WITH MINERALS) TABS tablet Take 1 tablet by mouth daily.   Yes Historical Provider, MD  simvastatin (ZOCOR) 20 MG tablet Take 20 mg by mouth every evening.   Yes Historical Provider, MD  sorbitol 70 % SOLN Take 30 mLs by mouth daily as needed. For constipation 12/04/12  Yes Nita Sells, MD  zolpidem (AMBIEN) 5 MG tablet Take 5 mg by mouth at bedtime. For insomnia   Yes Historical Provider, MD  HYDROcodone-acetaminophen (NORCO/VICODIN) 5-325 MG per tablet Take 2 tablets by mouth every 4 (four) hours as needed. 05/02/14   Tanna Furry, MD   BP 216/101  Pulse 82  Temp(Src) 98.2 F (36.8 C) (Oral)  Resp 18  Ht 5\' 2"  (1.575 m)  Wt 119 lb (53.978 kg)  BMI 21.76 kg/m2  SpO2 97% Physical Exam  Constitutional: She is oriented to person, place, and time. She appears well-developed and well-nourished. No distress.  HENT:  Head: Normocephalic.  Eyes: Conjunctivae are normal. Pupils are equal, round, and reactive to light. No scleral icterus.  Neck: Normal range of motion. Neck supple. No thyromegaly present.  Cardiovascular: Normal rate and regular rhythm.  Exam reveals no gallop and no friction rub.   No murmur heard. Pulmonary/Chest: Effort normal and breath sounds normal. No respiratory distress. She has no wheezes. She has no rales.  Normal exam to the left chest. She's not tender to palpate. She has pain with breathing. Normal breath sounds without pleural or pericardial friction rubs. No skin sensitivity or signs of  zoster. No upper abdominal pain.  Abdominal: Soft. Bowel sounds are normal. She exhibits no distension. There is no tenderness. There is no rebound.  Musculoskeletal: Normal range of motion.  Pain from the back to the posterior aspect of the left buttock and down the posterior aspect of the left thigh. No asymmetry of the size of the lower extremities to compare.  Neurological: She is alert and oriented to person, place, and time.  Skin: Skin is warm and dry. No rash noted.  Psychiatric: She has a normal mood and affect. Her behavior is normal.    ED Course  Procedures (including critical care time) Labs Review Labs Reviewed  CBC - Abnormal; Notable for the following:    Hemoglobin 11.2 (*)  RDW 17.5 (*)    All other components within normal limits  BASIC METABOLIC PANEL - Abnormal; Notable for the following:    Chloride 94 (*)    BUN 45 (*)    Creatinine, Ser 5.69 (*)    GFR calc non Af Amer 7 (*)    GFR calc Af Amer 8 (*)    All other components within normal limits  PRO B NATRIURETIC PEPTIDE - Abnormal; Notable for the following:    Pro B Natriuretic peptide (BNP) 20969.0 (*)    All other components within normal limits  D-DIMER, QUANTITATIVE - Abnormal; Notable for the following:    D-Dimer, Quant 3.29 (*)    All other components within normal limits  I-STAT TROPOININ, ED    Imaging Review Dg Chest 2 View  05/02/2014   CLINICAL DATA:  Chest pain for 2 days.  EXAM: CHEST  2 VIEW  COMPARISON:  CT chest 04/01/2013.  PA and lateral chest 05/17/2013.  FINDINGS: There is cardiomegaly without edema. Micronodularity in the right upper lobe is unchanged and consistent with old infectious or inflammatory process. The lungs are otherwise clear. Remote left rib fracture noted.  IMPRESSION: Cardiomegaly without acute disease.   Electronically Signed   By: Inge Rise M.D.   On: 05/02/2014 20:35   Ct Angio Chest Pe W/cm &/or Wo Cm  05/02/2014   CLINICAL DATA:  Left-sided pleuritic  chest pain.  Elevated D-dimer.  EXAM: CT ANGIOGRAPHY CHEST WITH CONTRAST  TECHNIQUE: Multidetector CT imaging of the chest was performed using the standard protocol during bolus administration of intravenous contrast. Multiplanar CT image reconstructions and MIPs were obtained to evaluate the vascular anatomy.  CONTRAST:  11mL OMNIPAQUE IOHEXOL 350 MG/ML SOLN  COMPARISON:  Chest CT 04/01/2013.  FINDINGS: Mediastinum: There are no filling defects within the pulmonary arterial tree to suggest underlying pulmonary embolism. Heart size is mildly enlarged. Dilatation of the pulmonic trunk (3.6 cm in diameter), which may suggest pulmonary arterial hypertension. There is atherosclerosis of the thoracic aorta, the great vessels of the mediastinum and the coronary arteries, including calcified atherosclerotic plaque in the left main, left anterior descending, left circumflex and right coronary arteries. Notably, the great vessels are markedly tortuous. Separate origin of the left vertebral artery directly off the aortic arch (normal anatomical variant) incidentally noted. No pathologically enlarged mediastinal are hilar lymph nodes. Esophagus is unremarkable in appearance.  Lungs/Pleura: Unusual appearance of a clustered areas of peribronchovascular reticulation and micronodularity throughout the lung bases bilaterally, most apparent in the lower lobes of the lungs bilaterally. There is also a small amount of similar findings in the periphery of the right upper lobe and left upper lobe near the apices. These are similar in retrospect compared to 04/01/2013. No acute consolidative airspace disease. No pleural effusions.  Upper Abdomen: Atrophy of the kidneys bilaterally. Multifocal low-attenuation lesions associated with the kidneys bilaterally, compatible with multiple cysts, largest of which is in the left renal sinus measuring up to 4.4 cm in diameter. Extensive atherosclerosis. Status post cholecystectomy.   Musculoskeletal: There are no aggressive appearing lytic or blastic lesions noted in the visualized portions of the skeleton.  Review of the MIP images confirms the above findings.  IMPRESSION: 1. No evidence of pulmonary embolism. 2. No acute findings in the thorax to account for the patient's symptoms. 3. Atherosclerosis, including left main and 3 vessel coronary artery disease. Please note that although the presence of coronary artery calcium documents the presence of coronary artery disease, the  severity of this disease and any potential stenosis cannot be assessed on this non-gated CT examination. Assessment for potential risk factor modification, dietary therapy or pharmacologic therapy may be warranted, if clinically indicated. 4. Unusual appearance of the lung parenchyma, which is similar to prior study 04/01/2013. Occasionally, similar findings can be seen in the setting of amyloidosis. Given the patient's history of renal disease, clinical correlation is recommended.   Electronically Signed   By: Vinnie Langton M.D.   On: 05/02/2014 21:59     EKG Interpretation   Date/Time:  Wednesday May 02 2014 18:53:26 EDT Ventricular Rate:  81 PR Interval:  168 QRS Duration: 72 QT Interval:  398 QTC Calculation: 462 R Axis:   73 Text Interpretation:  Normal sinus rhythm Moderate voltage criteria for  LVH, may be normal variant Borderline ECG ST abnormality, repolarization  abnormality. Confirmed by Jeneen Rinks  MD, Wilson ([id number]) on 05/02/2014 11:18:14 PM      MDM   Final diagnoses:  Pleurisy    EKG and troponin show no abnormalities. CT angiogram normal. She appropriate for outpatient treatment. Pain is pleuritic. No pneumothorax, pneumonia, pulmonary embolus. No sign of ACS. Plan will be symptomatic treatment is supple pain medicine for pleurisy.    Tanna Furry, MD 05/02/14 2342

## 2014-05-02 NOTE — ED Notes (Signed)
Pt presents with Left side chest pain more under her Left breast, weakness, SOB, dizziness, lightheaded, and diaphoretic. Pt states "it feels like there is fluid under my Left breast." Pt receives dialysis T, Th, Sat. Pt also reports Left leg pain with activity.

## 2014-05-13 DIAGNOSIS — N186 End stage renal disease: Secondary | ICD-10-CM | POA: Diagnosis not present

## 2014-05-14 ENCOUNTER — Encounter: Payer: Self-pay | Admitting: Internal Medicine

## 2014-05-15 DIAGNOSIS — N186 End stage renal disease: Secondary | ICD-10-CM | POA: Diagnosis not present

## 2014-05-15 DIAGNOSIS — D509 Iron deficiency anemia, unspecified: Secondary | ICD-10-CM | POA: Diagnosis not present

## 2014-05-15 DIAGNOSIS — Z992 Dependence on renal dialysis: Secondary | ICD-10-CM | POA: Diagnosis not present

## 2014-05-15 DIAGNOSIS — N039 Chronic nephritic syndrome with unspecified morphologic changes: Secondary | ICD-10-CM | POA: Diagnosis not present

## 2014-05-15 DIAGNOSIS — N2581 Secondary hyperparathyroidism of renal origin: Secondary | ICD-10-CM | POA: Diagnosis not present

## 2014-05-15 DIAGNOSIS — D631 Anemia in chronic kidney disease: Secondary | ICD-10-CM | POA: Diagnosis not present

## 2014-05-16 ENCOUNTER — Telehealth: Payer: Self-pay | Admitting: Family Medicine

## 2014-05-16 NOTE — Telephone Encounter (Signed)
Per Dr Sherren Mocha he is no longer taking new or re-established patients.

## 2014-05-16 NOTE — Telephone Encounter (Signed)
Pt would like to re-est with md

## 2014-05-17 DIAGNOSIS — N2581 Secondary hyperparathyroidism of renal origin: Secondary | ICD-10-CM | POA: Diagnosis not present

## 2014-05-17 DIAGNOSIS — D509 Iron deficiency anemia, unspecified: Secondary | ICD-10-CM | POA: Diagnosis not present

## 2014-05-17 DIAGNOSIS — Z992 Dependence on renal dialysis: Secondary | ICD-10-CM | POA: Diagnosis not present

## 2014-05-17 DIAGNOSIS — D631 Anemia in chronic kidney disease: Secondary | ICD-10-CM | POA: Diagnosis not present

## 2014-05-17 DIAGNOSIS — N186 End stage renal disease: Secondary | ICD-10-CM | POA: Diagnosis not present

## 2014-05-17 NOTE — Telephone Encounter (Signed)
S/w patient and advised her of Dr Sherren Mocha decision

## 2014-05-19 DIAGNOSIS — N2581 Secondary hyperparathyroidism of renal origin: Secondary | ICD-10-CM | POA: Diagnosis not present

## 2014-05-19 DIAGNOSIS — N186 End stage renal disease: Secondary | ICD-10-CM | POA: Diagnosis not present

## 2014-05-19 DIAGNOSIS — Z992 Dependence on renal dialysis: Secondary | ICD-10-CM | POA: Diagnosis not present

## 2014-05-19 DIAGNOSIS — D631 Anemia in chronic kidney disease: Secondary | ICD-10-CM | POA: Diagnosis not present

## 2014-05-19 DIAGNOSIS — D509 Iron deficiency anemia, unspecified: Secondary | ICD-10-CM | POA: Diagnosis not present

## 2014-05-19 DIAGNOSIS — N039 Chronic nephritic syndrome with unspecified morphologic changes: Secondary | ICD-10-CM | POA: Diagnosis not present

## 2014-05-22 DIAGNOSIS — Z992 Dependence on renal dialysis: Secondary | ICD-10-CM | POA: Diagnosis not present

## 2014-05-22 DIAGNOSIS — N186 End stage renal disease: Secondary | ICD-10-CM | POA: Diagnosis not present

## 2014-05-22 DIAGNOSIS — D509 Iron deficiency anemia, unspecified: Secondary | ICD-10-CM | POA: Diagnosis not present

## 2014-05-22 DIAGNOSIS — D631 Anemia in chronic kidney disease: Secondary | ICD-10-CM | POA: Diagnosis not present

## 2014-05-22 DIAGNOSIS — N2581 Secondary hyperparathyroidism of renal origin: Secondary | ICD-10-CM | POA: Diagnosis not present

## 2014-05-24 DIAGNOSIS — D631 Anemia in chronic kidney disease: Secondary | ICD-10-CM | POA: Diagnosis not present

## 2014-05-24 DIAGNOSIS — D509 Iron deficiency anemia, unspecified: Secondary | ICD-10-CM | POA: Diagnosis not present

## 2014-05-24 DIAGNOSIS — N039 Chronic nephritic syndrome with unspecified morphologic changes: Secondary | ICD-10-CM | POA: Diagnosis not present

## 2014-05-24 DIAGNOSIS — Z992 Dependence on renal dialysis: Secondary | ICD-10-CM | POA: Diagnosis not present

## 2014-05-24 DIAGNOSIS — N2581 Secondary hyperparathyroidism of renal origin: Secondary | ICD-10-CM | POA: Diagnosis not present

## 2014-05-24 DIAGNOSIS — N186 End stage renal disease: Secondary | ICD-10-CM | POA: Diagnosis not present

## 2014-05-26 DIAGNOSIS — N2581 Secondary hyperparathyroidism of renal origin: Secondary | ICD-10-CM | POA: Diagnosis not present

## 2014-05-26 DIAGNOSIS — Z992 Dependence on renal dialysis: Secondary | ICD-10-CM | POA: Diagnosis not present

## 2014-05-26 DIAGNOSIS — D509 Iron deficiency anemia, unspecified: Secondary | ICD-10-CM | POA: Diagnosis not present

## 2014-05-26 DIAGNOSIS — D631 Anemia in chronic kidney disease: Secondary | ICD-10-CM | POA: Diagnosis not present

## 2014-05-26 DIAGNOSIS — N186 End stage renal disease: Secondary | ICD-10-CM | POA: Diagnosis not present

## 2014-05-29 DIAGNOSIS — Z992 Dependence on renal dialysis: Secondary | ICD-10-CM | POA: Diagnosis not present

## 2014-05-29 DIAGNOSIS — D509 Iron deficiency anemia, unspecified: Secondary | ICD-10-CM | POA: Diagnosis not present

## 2014-05-29 DIAGNOSIS — N2581 Secondary hyperparathyroidism of renal origin: Secondary | ICD-10-CM | POA: Diagnosis not present

## 2014-05-29 DIAGNOSIS — N186 End stage renal disease: Secondary | ICD-10-CM | POA: Diagnosis not present

## 2014-05-29 DIAGNOSIS — D631 Anemia in chronic kidney disease: Secondary | ICD-10-CM | POA: Diagnosis not present

## 2014-05-31 DIAGNOSIS — N186 End stage renal disease: Secondary | ICD-10-CM | POA: Diagnosis not present

## 2014-05-31 DIAGNOSIS — N2581 Secondary hyperparathyroidism of renal origin: Secondary | ICD-10-CM | POA: Diagnosis not present

## 2014-05-31 DIAGNOSIS — Z992 Dependence on renal dialysis: Secondary | ICD-10-CM | POA: Diagnosis not present

## 2014-05-31 DIAGNOSIS — D509 Iron deficiency anemia, unspecified: Secondary | ICD-10-CM | POA: Diagnosis not present

## 2014-05-31 DIAGNOSIS — N039 Chronic nephritic syndrome with unspecified morphologic changes: Secondary | ICD-10-CM | POA: Diagnosis not present

## 2014-05-31 DIAGNOSIS — D631 Anemia in chronic kidney disease: Secondary | ICD-10-CM | POA: Diagnosis not present

## 2014-06-01 DIAGNOSIS — H353290 Exudative age-related macular degeneration, unspecified eye, stage unspecified: Secondary | ICD-10-CM | POA: Diagnosis not present

## 2014-06-01 DIAGNOSIS — H2510 Age-related nuclear cataract, unspecified eye: Secondary | ICD-10-CM | POA: Diagnosis not present

## 2014-06-01 DIAGNOSIS — H43819 Vitreous degeneration, unspecified eye: Secondary | ICD-10-CM | POA: Diagnosis not present

## 2014-06-01 DIAGNOSIS — H353190 Nonexudative age-related macular degeneration, unspecified eye, stage unspecified: Secondary | ICD-10-CM | POA: Diagnosis not present

## 2014-06-02 DIAGNOSIS — N039 Chronic nephritic syndrome with unspecified morphologic changes: Secondary | ICD-10-CM | POA: Diagnosis not present

## 2014-06-02 DIAGNOSIS — D631 Anemia in chronic kidney disease: Secondary | ICD-10-CM | POA: Diagnosis not present

## 2014-06-02 DIAGNOSIS — N2581 Secondary hyperparathyroidism of renal origin: Secondary | ICD-10-CM | POA: Diagnosis not present

## 2014-06-02 DIAGNOSIS — D509 Iron deficiency anemia, unspecified: Secondary | ICD-10-CM | POA: Diagnosis not present

## 2014-06-02 DIAGNOSIS — Z992 Dependence on renal dialysis: Secondary | ICD-10-CM | POA: Diagnosis not present

## 2014-06-02 DIAGNOSIS — N186 End stage renal disease: Secondary | ICD-10-CM | POA: Diagnosis not present

## 2014-06-05 DIAGNOSIS — N039 Chronic nephritic syndrome with unspecified morphologic changes: Secondary | ICD-10-CM | POA: Diagnosis not present

## 2014-06-05 DIAGNOSIS — N186 End stage renal disease: Secondary | ICD-10-CM | POA: Diagnosis not present

## 2014-06-05 DIAGNOSIS — Z992 Dependence on renal dialysis: Secondary | ICD-10-CM | POA: Diagnosis not present

## 2014-06-05 DIAGNOSIS — D509 Iron deficiency anemia, unspecified: Secondary | ICD-10-CM | POA: Diagnosis not present

## 2014-06-05 DIAGNOSIS — D631 Anemia in chronic kidney disease: Secondary | ICD-10-CM | POA: Diagnosis not present

## 2014-06-05 DIAGNOSIS — N2581 Secondary hyperparathyroidism of renal origin: Secondary | ICD-10-CM | POA: Diagnosis not present

## 2014-06-07 DIAGNOSIS — N2581 Secondary hyperparathyroidism of renal origin: Secondary | ICD-10-CM | POA: Diagnosis not present

## 2014-06-07 DIAGNOSIS — N186 End stage renal disease: Secondary | ICD-10-CM | POA: Diagnosis not present

## 2014-06-07 DIAGNOSIS — Z992 Dependence on renal dialysis: Secondary | ICD-10-CM | POA: Diagnosis not present

## 2014-06-07 DIAGNOSIS — D509 Iron deficiency anemia, unspecified: Secondary | ICD-10-CM | POA: Diagnosis not present

## 2014-06-07 DIAGNOSIS — D631 Anemia in chronic kidney disease: Secondary | ICD-10-CM | POA: Diagnosis not present

## 2014-06-09 DIAGNOSIS — N2581 Secondary hyperparathyroidism of renal origin: Secondary | ICD-10-CM | POA: Diagnosis not present

## 2014-06-09 DIAGNOSIS — Z992 Dependence on renal dialysis: Secondary | ICD-10-CM | POA: Diagnosis not present

## 2014-06-09 DIAGNOSIS — D509 Iron deficiency anemia, unspecified: Secondary | ICD-10-CM | POA: Diagnosis not present

## 2014-06-09 DIAGNOSIS — N186 End stage renal disease: Secondary | ICD-10-CM | POA: Diagnosis not present

## 2014-06-09 DIAGNOSIS — D631 Anemia in chronic kidney disease: Secondary | ICD-10-CM | POA: Diagnosis not present

## 2014-06-12 DIAGNOSIS — D509 Iron deficiency anemia, unspecified: Secondary | ICD-10-CM | POA: Diagnosis not present

## 2014-06-12 DIAGNOSIS — N039 Chronic nephritic syndrome with unspecified morphologic changes: Secondary | ICD-10-CM | POA: Diagnosis not present

## 2014-06-12 DIAGNOSIS — D631 Anemia in chronic kidney disease: Secondary | ICD-10-CM | POA: Diagnosis not present

## 2014-06-12 DIAGNOSIS — Z992 Dependence on renal dialysis: Secondary | ICD-10-CM | POA: Diagnosis not present

## 2014-06-12 DIAGNOSIS — N186 End stage renal disease: Secondary | ICD-10-CM | POA: Diagnosis not present

## 2014-06-12 DIAGNOSIS — N2581 Secondary hyperparathyroidism of renal origin: Secondary | ICD-10-CM | POA: Diagnosis not present

## 2014-06-14 DIAGNOSIS — D631 Anemia in chronic kidney disease: Secondary | ICD-10-CM | POA: Diagnosis not present

## 2014-06-14 DIAGNOSIS — N2581 Secondary hyperparathyroidism of renal origin: Secondary | ICD-10-CM | POA: Diagnosis not present

## 2014-06-14 DIAGNOSIS — N186 End stage renal disease: Secondary | ICD-10-CM | POA: Diagnosis not present

## 2014-06-14 DIAGNOSIS — D509 Iron deficiency anemia, unspecified: Secondary | ICD-10-CM | POA: Diagnosis not present

## 2014-06-14 DIAGNOSIS — Z992 Dependence on renal dialysis: Secondary | ICD-10-CM | POA: Diagnosis not present

## 2014-06-18 DIAGNOSIS — I509 Heart failure, unspecified: Secondary | ICD-10-CM | POA: Diagnosis not present

## 2014-06-18 DIAGNOSIS — N186 End stage renal disease: Secondary | ICD-10-CM | POA: Diagnosis not present

## 2014-06-18 DIAGNOSIS — I10 Essential (primary) hypertension: Secondary | ICD-10-CM | POA: Diagnosis not present

## 2014-07-13 DIAGNOSIS — N186 End stage renal disease: Secondary | ICD-10-CM | POA: Diagnosis not present

## 2014-07-14 DIAGNOSIS — N2581 Secondary hyperparathyroidism of renal origin: Secondary | ICD-10-CM | POA: Diagnosis not present

## 2014-07-14 DIAGNOSIS — D509 Iron deficiency anemia, unspecified: Secondary | ICD-10-CM | POA: Diagnosis not present

## 2014-07-14 DIAGNOSIS — N186 End stage renal disease: Secondary | ICD-10-CM | POA: Diagnosis not present

## 2014-07-14 DIAGNOSIS — Z992 Dependence on renal dialysis: Secondary | ICD-10-CM | POA: Diagnosis not present

## 2014-07-14 DIAGNOSIS — D631 Anemia in chronic kidney disease: Secondary | ICD-10-CM | POA: Diagnosis not present

## 2014-07-23 DIAGNOSIS — R509 Fever, unspecified: Secondary | ICD-10-CM | POA: Diagnosis not present

## 2014-07-23 DIAGNOSIS — N898 Other specified noninflammatory disorders of vagina: Secondary | ICD-10-CM | POA: Diagnosis not present

## 2014-07-28 ENCOUNTER — Emergency Department (HOSPITAL_COMMUNITY): Payer: Medicare Other

## 2014-07-28 ENCOUNTER — Encounter (HOSPITAL_COMMUNITY): Payer: Self-pay | Admitting: Emergency Medicine

## 2014-07-28 ENCOUNTER — Emergency Department (HOSPITAL_COMMUNITY)
Admission: EM | Admit: 2014-07-28 | Discharge: 2014-07-28 | Disposition: A | Discharge status: Home / Self Care | Payer: Medicare Other | Attending: Emergency Medicine | Admitting: Emergency Medicine

## 2014-07-28 DIAGNOSIS — I120 Hypertensive chronic kidney disease with stage 5 chronic kidney disease or end stage renal disease: Secondary | ICD-10-CM | POA: Diagnosis not present

## 2014-07-28 DIAGNOSIS — Z860100 Personal history of colon polyps, unspecified: Secondary | ICD-10-CM | POA: Diagnosis not present

## 2014-07-28 DIAGNOSIS — Z8669 Personal history of other diseases of the nervous system and sense organs: Secondary | ICD-10-CM | POA: Insufficient documentation

## 2014-07-28 DIAGNOSIS — I150 Renovascular hypertension: Secondary | ICD-10-CM | POA: Diagnosis not present

## 2014-07-28 DIAGNOSIS — Z87891 Personal history of nicotine dependence: Secondary | ICD-10-CM | POA: Insufficient documentation

## 2014-07-28 DIAGNOSIS — I509 Heart failure, unspecified: Secondary | ICD-10-CM | POA: Insufficient documentation

## 2014-07-28 DIAGNOSIS — Z79899 Other long term (current) drug therapy: Secondary | ICD-10-CM | POA: Insufficient documentation

## 2014-07-28 DIAGNOSIS — Z992 Dependence on renal dialysis: Secondary | ICD-10-CM | POA: Diagnosis not present

## 2014-07-28 DIAGNOSIS — D649 Anemia, unspecified: Secondary | ICD-10-CM | POA: Diagnosis not present

## 2014-07-28 DIAGNOSIS — E785 Hyperlipidemia, unspecified: Secondary | ICD-10-CM | POA: Diagnosis not present

## 2014-07-28 DIAGNOSIS — K219 Gastro-esophageal reflux disease without esophagitis: Secondary | ICD-10-CM | POA: Insufficient documentation

## 2014-07-28 DIAGNOSIS — N186 End stage renal disease: Secondary | ICD-10-CM | POA: Insufficient documentation

## 2014-07-28 DIAGNOSIS — R51 Headache: Secondary | ICD-10-CM | POA: Insufficient documentation

## 2014-07-28 DIAGNOSIS — Z8659 Personal history of other mental and behavioral disorders: Secondary | ICD-10-CM | POA: Insufficient documentation

## 2014-07-28 DIAGNOSIS — R011 Cardiac murmur, unspecified: Secondary | ICD-10-CM | POA: Insufficient documentation

## 2014-07-28 DIAGNOSIS — Z9889 Other specified postprocedural states: Secondary | ICD-10-CM | POA: Diagnosis not present

## 2014-07-28 LAB — I-STAT CHEM 8, ED
BUN: 13 mg/dL (ref 6–23)
CALCIUM ION: 0.88 mmol/L — AB (ref 1.13–1.30)
Chloride: 92 mEq/L — ABNORMAL LOW (ref 96–112)
Creatinine, Ser: 3.2 mg/dL — ABNORMAL HIGH (ref 0.50–1.10)
GLUCOSE: 109 mg/dL — AB (ref 70–99)
HEMATOCRIT: 35 % — AB (ref 36.0–46.0)
Hemoglobin: 11.9 g/dL — ABNORMAL LOW (ref 12.0–15.0)
Potassium: 3.4 mEq/L — ABNORMAL LOW (ref 3.7–5.3)
Sodium: 135 mEq/L — ABNORMAL LOW (ref 137–147)
TCO2: 32 mmol/L (ref 0–100)

## 2014-07-28 MED ORDER — DOXAZOSIN MESYLATE 8 MG PO TABS
8.0000 mg | ORAL_TABLET | Freq: Every day | ORAL | Status: DC
  Administered 2014-07-28: 8 mg via ORAL
  Filled 2014-07-28: qty 1

## 2014-07-28 NOTE — Discharge Instructions (Signed)
Take your blood pressure as directed followup with your doctors at dialysis Hypertension Hypertension, commonly called high blood pressure, is when the force of blood pumping through your arteries is too strong. Your arteries are the blood vessels that carry blood from your heart throughout your body. A blood pressure reading consists of a higher number over a lower number, such as 110/72. The higher number (systolic) is the pressure inside your arteries when your heart pumps. The lower number (diastolic) is the pressure inside your arteries when your heart relaxes. Ideally you want your blood pressure below 120/80. Hypertension forces your heart to work harder to pump blood. Your arteries may become narrow or stiff. Having hypertension puts you at risk for heart disease, stroke, and other problems.  RISK FACTORS Some risk factors for high blood pressure are controllable. Others are not.  Risk factors you cannot control include:   Race. You may be at higher risk if you are African American.  Age. Risk increases with age.  Gender. Men are at higher risk than women before age 9 years. After age 48, women are at higher risk than men. Risk factors you can control include:  Not getting enough exercise or physical activity.  Being overweight.  Getting too much fat, sugar, calories, or salt in your diet.  Drinking too much alcohol. SIGNS AND SYMPTOMS Hypertension does not usually cause signs or symptoms. Extremely high blood pressure (hypertensive crisis) may cause headache, anxiety, shortness of breath, and nosebleed. DIAGNOSIS  To check if you have hypertension, your health care provider will measure your blood pressure while you are seated, with your arm held at the level of your heart. It should be measured at least twice using the same arm. Certain conditions can cause a difference in blood pressure between your right and left arms. A blood pressure reading that is higher than normal on one  occasion does not mean that you need treatment. If one blood pressure reading is high, ask your health care provider about having it checked again. TREATMENT  Treating high blood pressure includes making lifestyle changes and possibly taking medicine. Living a healthy lifestyle can help lower high blood pressure. You may need to change some of your habits. Lifestyle changes may include:  Following the DASH diet. This diet is high in fruits, vegetables, and whole grains. It is low in salt, red meat, and added sugars.  Getting at least 2 hours of brisk physical activity every week.  Losing weight if necessary.  Not smoking.  Limiting alcoholic beverages.  Learning ways to reduce stress. If lifestyle changes are not enough to get your blood pressure under control, your health care provider may prescribe medicine. You may need to take more than one. Work closely with your health care provider to understand the risks and benefits. HOME CARE INSTRUCTIONS  Have your blood pressure rechecked as directed by your health care provider.   Take medicines only as directed by your health care provider. Follow the directions carefully. Blood pressure medicines must be taken as prescribed. The medicine does not work as well when you skip doses. Skipping doses also puts you at risk for problems.   Do not smoke.   Monitor your blood pressure at home as directed by your health care provider. SEEK MEDICAL CARE IF:   You think you are having a reaction to medicines taken.  You have recurrent headaches or feel dizzy.  You have swelling in your ankles.  You have trouble with your vision. SEEK  IMMEDIATE MEDICAL CARE IF:  You develop a severe headache or confusion.  You have unusual weakness, numbness, or feel faint.  You have severe chest or abdominal pain.  You vomit repeatedly.  You have trouble breathing. MAKE SURE YOU:   Understand these instructions.  Will watch your  condition.  Will get help right away if you are not doing well or get worse. Document Released: 11/30/2005 Document Revised: 04/16/2014 Document Reviewed: 09/22/2013 [facility] Patient Information 2015 [place], [place]. This information is not intended to replace advice given to you by your health care provider. Make sure you discuss any questions you have with your health care provider. Headaches, Frequently Asked Questions MIGRAINE HEADACHES Q: What is migraine? What causes it? How can I treat it? A: Generally, migraine headaches begin as a dull ache. Then they develop into a constant, throbbing, and pulsating pain. You may experience pain at the temples. You may experience pain at the front or back of one or both sides of the head. The pain is usually accompanied by a combination of:  Nausea.  Vomiting.  Sensitivity to light and noise. Some people (about 15%) experience an aura (see below) before an attack. The cause of migraine is believed to be chemical reactions in the brain. Treatment for migraine may include over-the-counter or prescription medications. It may also include self-help techniques. These include relaxation training and biofeedback.  Q: What is an aura? A: About 15% of people with migraine get an "aura". This is a sign of neurological symptoms that occur before a migraine headache. You may see wavy or jagged lines, dots, or flashing lights. You might experience tunnel vision or blind spots in one or both eyes. The aura can include visual or auditory hallucinations (something imagined). It may include disruptions in smell (such as strange odors), taste or touch. Other symptoms include:  Numbness.  A "pins and needles" sensation.  Difficulty in recalling or speaking the correct word. These neurological events may last as long as 60 minutes. These symptoms will fade as the headache begins. Q: What is a trigger? A: Certain physical or environmental factors can lead to or  "trigger" a migraine. These include:  Foods.  Hormonal changes.  Weather.  Stress. It is important to remember that triggers are different for everyone. To help prevent migraine attacks, you need to figure out which triggers affect you. Keep a headache diary. This is a good way to track triggers. The diary will help you talk to your healthcare professional about your condition. Q: Does weather affect migraines? A: Bright sunshine, hot, humid conditions, and drastic changes in barometric pressure may lead to, or "trigger," a migraine attack in some people. But studies have shown that weather does not act as a trigger for everyone with migraines. Q: What is the link between migraine and hormones? A: Hormones start and regulate many of your body's functions. Hormones keep your body in balance within a constantly changing environment. The levels of hormones in your body are unbalanced at times. Examples are during menstruation, pregnancy, or menopause. That can lead to a migraine attack. In fact, about three quarters of all women with migraine report that their attacks are related to the menstrual cycle.  Q: Is there an increased risk of stroke for migraine sufferers? A: The likelihood of a migraine attack causing a stroke is very remote. That is not to say that migraine sufferers cannot have a stroke associated with their migraines. In persons under age 37, the most common associated  factor for stroke is migraine headache. But over the course of a person's normal life span, the occurrence of migraine headache may actually be associated with a reduced risk of dying from cerebrovascular disease due to stroke.  Q: What are acute medications for migraine? A: Acute medications are used to treat the pain of the headache after it has started. Examples over-the-counter medications, NSAIDs, ergots, and triptans.  Q: What are the triptans? A: Triptans are the newest class of abortive medications. They are  specifically targeted to treat migraine. Triptans are vasoconstrictors. They moderate some chemical reactions in the brain. The triptans work on receptors in your brain. Triptans help to restore the balance of a neurotransmitter called serotonin. Fluctuations in levels of serotonin are thought to be a main cause of migraine.  Q: Are over-the-counter medications for migraine effective? A: Over-the-counter, or "OTC," medications may be effective in relieving mild to moderate pain and associated symptoms of migraine. But you should see your caregiver before beginning any treatment regimen for migraine.  Q: What are preventive medications for migraine? A: Preventive medications for migraine are sometimes referred to as "prophylactic" treatments. They are used to reduce the frequency, severity, and length of migraine attacks. Examples of preventive medications include antiepileptic medications, antidepressants, beta-blockers, calcium channel blockers, and NSAIDs (nonsteroidal anti-inflammatory drugs). Q: Why are anticonvulsants used to treat migraine? A: During the past few years, there has been an increased interest in antiepileptic drugs for the prevention of migraine. They are sometimes referred to as "anticonvulsants". Both epilepsy and migraine may be caused by similar reactions in the brain.  Q: Why are antidepressants used to treat migraine? A: Antidepressants are typically used to treat people with depression. They may reduce migraine frequency by regulating chemical levels, such as serotonin, in the brain.  Q: What alternative therapies are used to treat migraine? A: The term "alternative therapies" is often used to describe treatments considered outside the scope of conventional Western medicine. Examples of alternative therapy include acupuncture, acupressure, and yoga. Another common alternative treatment is herbal therapy. Some herbs are believed to relieve headache pain. Always discuss alternative  therapies with your caregiver before proceeding. Some herbal products contain arsenic and other toxins. TENSION HEADACHES Q: What is a tension-type headache? What causes it? How can I treat it? A: Tension-type headaches occur randomly. They are often the result of temporary stress, anxiety, fatigue, or anger. Symptoms include soreness in your temples, a tightening band-like sensation around your head (a "vice-like" ache). Symptoms can also include a pulling feeling, pressure sensations, and contracting head and neck muscles. The headache begins in your forehead, temples, or the back of your head and neck. Treatment for tension-type headache may include over-the-counter or prescription medications. Treatment may also include self-help techniques such as relaxation training and biofeedback. CLUSTER HEADACHES Q: What is a cluster headache? What causes it? How can I treat it? A: Cluster headache gets its name because the attacks come in groups. The pain arrives with little, if any, warning. It is usually on one side of the head. A tearing or bloodshot eye and a runny nose on the same side of the headache may also accompany the pain. Cluster headaches are believed to be caused by chemical reactions in the brain. They have been described as the most severe and intense of any headache type. Treatment for cluster headache includes prescription medication and oxygen. SINUS HEADACHES Q: What is a sinus headache? What causes it? How can I treat it? A: When a  cavity in the bones of the face and skull (a sinus) becomes inflamed, the inflammation will cause localized pain. This condition is usually the result of an allergic reaction, a tumor, or an infection. If your headache is caused by a sinus blockage, such as an infection, you will probably have a fever. An x-ray will confirm a sinus blockage. Your caregiver's treatment might include antibiotics for the infection, as well as antihistamines or decongestants.   REBOUND HEADACHES Q: What is a rebound headache? What causes it? How can I treat it? A: A pattern of taking acute headache medications too often can lead to a condition known as "rebound headache." A pattern of taking too much headache medication includes taking it more than 2 days per week or in excessive amounts. That means more than the label or a caregiver advises. With rebound headaches, your medications not only stop relieving pain, they actually begin to cause headaches. Doctors treat rebound headache by tapering the medication that is being overused. Sometimes your caregiver will gradually substitute a different type of treatment or medication. Stopping may be a challenge. Regularly overusing a medication increases the potential for serious side effects. Consult a caregiver if you regularly use headache medications more than 2 days per week or more than the label advises. ADDITIONAL QUESTIONS AND ANSWERS Q: What is biofeedback? A: Biofeedback is a self-help treatment. Biofeedback uses special equipment to monitor your body's involuntary physical responses. Biofeedback monitors:  Breathing.  Pulse.  Heart rate.  Temperature.  Muscle tension.  Brain activity. Biofeedback helps you refine and perfect your relaxation exercises. You learn to control the physical responses that are related to stress. Once the technique has been mastered, you do not need the equipment any more. Q: Are headaches hereditary? A: Four out of five (80%) of people that suffer report a family history of migraine. Scientists are not sure if this is genetic or a family predisposition. Despite the uncertainty, a child has a 50% chance of having migraine if one parent suffers. The child has a 75% chance if both parents suffer.  Q: Can children get headaches? A: By the time they reach high school, most young people have experienced some type of headache. Many safe and effective approaches or medications can prevent a  headache from occurring or stop it after it has begun.  Q: What type of doctor should I see to diagnose and treat my headache? A: Start with your primary caregiver. Discuss his or her experience and approach to headaches. Discuss methods of classification, diagnosis, and treatment. Your caregiver may decide to recommend you to a headache specialist, depending upon your symptoms or other physical conditions. Having diabetes, allergies, etc., may require a more comprehensive and inclusive approach to your headache. The National Headache Foundation will provide, upon request, a list of [facility] physician members in your state. Document Released: 02/20/2004 Document Revised: 02/22/2012 Document Reviewed: 07/30/2008 [facility] Patient Information 2015 [place], [place]. This information is not intended to replace advice given to you by your health care provider. Make sure you discuss any questions you have with your health care provider.

## 2014-07-28 NOTE — ED Provider Notes (Signed)
CSN: [id number]     Arrival date & time 07/28/14  1031 History   First MD Initiated Contact with Patient 07/28/14 1113     Chief Complaint  Patient presents with  . Hypertension  . Headache     (Consider location/radiation/quality/duration/timing/severity/associated sxs/prior Treatment) HPI Comments: Patient here complaining of a headache that began over the past 2 days and worse today. States that she has been noncompliant with her blood pressure medication for dialysis today and her systolic blood pressure was over 200. She completed almost 4 hours of dialysis today. Denies any anginal chest pain. No shortness of breath. No vomiting or fever or chills or neck pain. No treatment given prior to arrival. She denies any focal weakness. No gait ataxia. Has been instructed in the past to use an extra dose of her medication for dialysis if her blood pressures elevated. Patient sent here for further evaluation.  Patient is a 63 y.o. female presenting with hypertension and headaches. The history is provided by the patient.  Hypertension Associated symptoms include headaches.  Headache   Past Medical History  Diagnosis Date  . Anxiety   . Blood transfusion   . Depression   . GERD (gastroesophageal reflux disease)   . Hyperlipidemia   . Hypertension   . Neuromuscular disorder     neuropathy hand and legs  . Weight loss, unintentional   . Adenomatous polyp of colon 10/2010  . Diverticula, colon   . Heart murmur   . CHF (congestive heart failure)   . 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  Had deceased donor kidney transplant in 1996.  Had some early rejection then stable function for years, then had slow decline of function and went back on hemodialysis in 2012.  Gets HD TTS schedule at [facility] still using L forearm AVF.     Marland Kitchen Anemia in CKD (chronic kidney disease) 11/07/2012   Past Surgical  History  Procedure Laterality Date  . Kidney transplant  1996  . Back surgery    . Cervical fusion    . Av fistula placement      for dialysis  . Cholecystectomy  12/02/2012    Procedure: LAPAROSCOPIC CHOLECYSTECTOMY WITH INTRAOPERATIVE CHOLANGIOGRAM;  Surgeon: Edward Jolly, MD;  Location: [facility] OR;  Service: General;  Laterality: N/A;   Family History  Problem Relation Age of Onset  . Colon cancer Brother   . Coronary artery disease Mother 81  . Esophageal cancer Neg Hx   . Stomach cancer Neg Hx   . Rectal cancer Neg Hx    History  Substance Use Topics  . Smoking status: Former Smoker    Types: Cigarettes    Quit date: 12/31/1991  . Smokeless tobacco: Never Used  . Alcohol Use: No   OB History   Grav Para Term Preterm Abortions TAB SAB Ect Mult Living                 Review of Systems  Neurological: Positive for headaches.  All other systems reviewed and are negative.     Allergies  Sulfa antibiotics  Home Medications   Prior to Admission medications   Medication Sig Start Date End Date Taking? Authorizing Provider  allopurinol (ZYLOPRIM) 300 MG tablet Take 150 mg by mouth at bedtime.    Historical Provider, MD  calcium carbonate (TUMS - DOSED IN MG ELEMENTAL CALCIUM) 500 MG chewable tablet  Chew 1 tablet by mouth 3 (three) times daily as needed. For acid    Historical Provider, MD  doxazosin (CARDURA) 8 MG tablet Take 8 mg by mouth daily as needed (only when BP is over 180).     Historical Provider, MD  HYDROcodone-acetaminophen (NORCO/VICODIN) 5-325 MG per tablet Take 2 tablets by mouth every 4 (four) hours as needed. 05/02/14   Tanna Furry, MD  labetalol (NORMODYNE) 300 MG tablet Take 300 mg by mouth 2 (two) times daily. 11/09/12   Velvet Bathe, MD  losartan (COZAAR) 100 MG tablet Take 100 mg by mouth at bedtime.     Historical Provider, MD  Multiple Vitamin (MULTIVITAMIN WITH MINERALS) TABS tablet Take 1 tablet by mouth daily.    Historical Provider, MD   simvastatin (ZOCOR) 20 MG tablet Take 20 mg by mouth every evening.    Historical Provider, MD  sorbitol 70 % SOLN Take 30 mLs by mouth daily as needed. For constipation 12/04/12   Nita Sells, MD  zolpidem (AMBIEN) 5 MG tablet Take 5 mg by mouth at bedtime. For insomnia    Historical Provider, MD   BP 173/95  Pulse 75  Temp(Src) 98.2 F (36.8 C) (Oral)  Resp 18  SpO2 100% Physical Exam  Nursing note and vitals reviewed. Constitutional: She is oriented to person, place, and time. She appears well-developed and well-nourished.  Non-toxic appearance. No distress.  HENT:  Head: Normocephalic and atraumatic.  Eyes: Conjunctivae, EOM and lids are normal. Pupils are equal, round, and reactive to light.  Neck: Normal range of motion. Neck supple. No tracheal deviation present. No mass present.  Cardiovascular: Normal rate, regular rhythm and normal heart sounds.  Exam reveals no gallop.   No murmur heard. Pulmonary/Chest: Effort normal and breath sounds normal. No stridor. No respiratory distress. She has no decreased breath sounds. She has no wheezes. She has no rhonchi. She has no rales.  Abdominal: Soft. Normal appearance and bowel sounds are normal. She exhibits no distension. There is no tenderness. There is no rebound and no CVA tenderness.  Musculoskeletal: Normal range of motion. She exhibits no edema and no tenderness.  Neurological: She is alert and oriented to person, place, and time. She has normal strength. No cranial nerve deficit or sensory deficit. GCS eye subscore is 4. GCS verbal subscore is 5. GCS motor subscore is 6.  Skin: Skin is warm and dry. No abrasion and no rash noted.  Psychiatric: She has a normal mood and affect. Her speech is normal and behavior is normal.    ED Course  Procedures (including critical care time) Labs Review Labs Reviewed  I-STAT CHEM 8, ED    Imaging Review No results found.   EKG Interpretation None      MDM   Final  diagnoses:  None    Patient's head CT negative. Blood pressure medication given here. No focal neurological deficits. Stable for discharge    Leota Jacobsen, MD 07/28/14 1252

## 2014-07-28 NOTE — ED Notes (Signed)
Pt stable, NAD noted. Pt reports she feels better. Pt given discharge instructions and asked to follow up with PCP. Pt discharged home by wheelchair.

## 2014-07-28 NOTE — ED Notes (Signed)
Patient transported to CT 

## 2014-07-28 NOTE — ED Notes (Signed)
Dr Allen at bedside  

## 2014-07-28 NOTE — ED Notes (Signed)
Per pt sts that she has had a severe HA over the past few days. sts the pain is constant and her blood pressure has been elevated. sts she went to dialysis this am and BP was over 200.

## 2014-07-30 DIAGNOSIS — H2510 Age-related nuclear cataract, unspecified eye: Secondary | ICD-10-CM | POA: Diagnosis not present

## 2014-07-31 ENCOUNTER — Encounter (HOSPITAL_COMMUNITY): Payer: Self-pay | Admitting: Emergency Medicine

## 2014-07-31 ENCOUNTER — Emergency Department (HOSPITAL_COMMUNITY): Payer: Medicare Other

## 2014-07-31 ENCOUNTER — Emergency Department (HOSPITAL_COMMUNITY)
Admission: EM | Admit: 2014-07-31 | Discharge: 2014-07-31 | Disposition: A | Discharge status: Home / Self Care | Payer: Medicare Other | Attending: Emergency Medicine | Admitting: Emergency Medicine

## 2014-07-31 DIAGNOSIS — R0602 Shortness of breath: Secondary | ICD-10-CM | POA: Insufficient documentation

## 2014-07-31 DIAGNOSIS — Z860100 Personal history of colon polyps, unspecified: Secondary | ICD-10-CM | POA: Diagnosis not present

## 2014-07-31 DIAGNOSIS — K439 Ventral hernia without obstruction or gangrene: Secondary | ICD-10-CM | POA: Diagnosis not present

## 2014-07-31 DIAGNOSIS — E8779 Other fluid overload: Secondary | ICD-10-CM | POA: Diagnosis not present

## 2014-07-31 DIAGNOSIS — R011 Cardiac murmur, unspecified: Secondary | ICD-10-CM | POA: Diagnosis not present

## 2014-07-31 DIAGNOSIS — R197 Diarrhea, unspecified: Secondary | ICD-10-CM | POA: Diagnosis not present

## 2014-07-31 DIAGNOSIS — N186 End stage renal disease: Secondary | ICD-10-CM | POA: Diagnosis not present

## 2014-07-31 DIAGNOSIS — Z87891 Personal history of nicotine dependence: Secondary | ICD-10-CM | POA: Diagnosis not present

## 2014-07-31 DIAGNOSIS — Z792 Long term (current) use of antibiotics: Secondary | ICD-10-CM | POA: Insufficient documentation

## 2014-07-31 DIAGNOSIS — R51 Headache: Secondary | ICD-10-CM | POA: Insufficient documentation

## 2014-07-31 DIAGNOSIS — I120 Hypertensive chronic kidney disease with stage 5 chronic kidney disease or end stage renal disease: Secondary | ICD-10-CM | POA: Insufficient documentation

## 2014-07-31 DIAGNOSIS — I509 Heart failure, unspecified: Secondary | ICD-10-CM | POA: Insufficient documentation

## 2014-07-31 DIAGNOSIS — Z8659 Personal history of other mental and behavioral disorders: Secondary | ICD-10-CM | POA: Diagnosis not present

## 2014-07-31 DIAGNOSIS — J810 Acute pulmonary edema: Secondary | ICD-10-CM

## 2014-07-31 DIAGNOSIS — M542 Cervicalgia: Secondary | ICD-10-CM | POA: Insufficient documentation

## 2014-07-31 DIAGNOSIS — Z79899 Other long term (current) drug therapy: Secondary | ICD-10-CM | POA: Diagnosis not present

## 2014-07-31 DIAGNOSIS — R1084 Generalized abdominal pain: Secondary | ICD-10-CM | POA: Insufficient documentation

## 2014-07-31 DIAGNOSIS — R6889 Other general symptoms and signs: Secondary | ICD-10-CM | POA: Diagnosis not present

## 2014-07-31 DIAGNOSIS — E785 Hyperlipidemia, unspecified: Secondary | ICD-10-CM | POA: Diagnosis not present

## 2014-07-31 DIAGNOSIS — K5730 Diverticulosis of large intestine without perforation or abscess without bleeding: Secondary | ICD-10-CM | POA: Diagnosis not present

## 2014-07-31 DIAGNOSIS — I700 Atherosclerosis of aorta: Secondary | ICD-10-CM | POA: Diagnosis not present

## 2014-07-31 DIAGNOSIS — Z992 Dependence on renal dialysis: Secondary | ICD-10-CM | POA: Insufficient documentation

## 2014-07-31 DIAGNOSIS — J90 Pleural effusion, not elsewhere classified: Secondary | ICD-10-CM | POA: Diagnosis not present

## 2014-07-31 DIAGNOSIS — J811 Chronic pulmonary edema: Secondary | ICD-10-CM | POA: Diagnosis not present

## 2014-07-31 LAB — CBC WITH DIFFERENTIAL/PLATELET
BASOS ABS: 0 10*3/uL (ref 0.0–0.1)
BASOS PCT: 0 % (ref 0–1)
EOS PCT: 3 % (ref 0–5)
Eosinophils Absolute: 0.2 10*3/uL (ref 0.0–0.7)
HEMATOCRIT: 28.8 % — AB (ref 36.0–46.0)
Hemoglobin: 9.2 g/dL — ABNORMAL LOW (ref 12.0–15.0)
Lymphocytes Relative: 14 % (ref 12–46)
Lymphs Abs: 1.2 10*3/uL (ref 0.7–4.0)
MCH: 28.5 pg (ref 26.0–34.0)
MCHC: 31.9 g/dL (ref 30.0–36.0)
MCV: 89.2 fL (ref 78.0–100.0)
MONO ABS: 0.4 10*3/uL (ref 0.1–1.0)
Monocytes Relative: 5 % (ref 3–12)
NEUTROS ABS: 6.8 10*3/uL (ref 1.7–7.7)
Neutrophils Relative %: 78 % — ABNORMAL HIGH (ref 43–77)
Platelets: 234 10*3/uL (ref 150–400)
RBC: 3.23 MIL/uL — ABNORMAL LOW (ref 3.87–5.11)
RDW: 17.3 % — AB (ref 11.5–15.5)
WBC: 8.7 10*3/uL (ref 4.0–10.5)

## 2014-07-31 LAB — COMPREHENSIVE METABOLIC PANEL
ALBUMIN: 3 g/dL — AB (ref 3.5–5.2)
ALT: 7 U/L (ref 0–35)
AST: 20 U/L (ref 0–37)
Alkaline Phosphatase: 150 U/L — ABNORMAL HIGH (ref 39–117)
Anion gap: 20 — ABNORMAL HIGH (ref 5–15)
BUN: 65 mg/dL — ABNORMAL HIGH (ref 6–23)
CO2: 27 mEq/L (ref 19–32)
CREATININE: 8.73 mg/dL — AB (ref 0.50–1.10)
Calcium: 8 mg/dL — ABNORMAL LOW (ref 8.4–10.5)
Chloride: 92 mEq/L — ABNORMAL LOW (ref 96–112)
GFR calc Af Amer: 5 mL/min — ABNORMAL LOW (ref >90)
GFR calc non Af Amer: 4 mL/min — ABNORMAL LOW (ref >90)
Glucose, Bld: 103 mg/dL — ABNORMAL HIGH (ref 70–99)
Potassium: 5 mEq/L (ref 3.7–5.3)
Sodium: 139 mEq/L (ref 137–147)
Total Bilirubin: 0.6 mg/dL (ref 0.3–1.2)
Total Protein: 7.4 g/dL (ref 6.0–8.3)

## 2014-07-31 LAB — TROPONIN I

## 2014-07-31 LAB — PRO B NATRIURETIC PEPTIDE

## 2014-07-31 MED ORDER — ALBUTEROL SULFATE (2.5 MG/3ML) 0.083% IN NEBU
5.0000 mg | INHALATION_SOLUTION | RESPIRATORY_TRACT | Status: AC
  Administered 2014-07-31: 5 mg via RESPIRATORY_TRACT
  Filled 2014-07-31: qty 6

## 2014-07-31 MED ORDER — PENTAFLUOROPROP-TETRAFLUOROETH EX AERO
1.0000 "application " | INHALATION_SPRAY | CUTANEOUS | Status: DC | PRN
  Filled 2014-07-31: qty 30

## 2014-07-31 MED ORDER — IOHEXOL 350 MG/ML SOLN
80.0000 mL | Freq: Once | INTRAVENOUS | Status: AC | PRN
  Administered 2014-07-31: 80 mL via INTRAVENOUS

## 2014-07-31 MED ORDER — LIDOCAINE HCL (PF) 1 % IJ SOLN: 5.0000 mL | INTRAMUSCULAR | Status: DC | PRN

## 2014-07-31 MED ORDER — IOHEXOL 300 MG/ML  SOLN
25.0000 mL | INTRAMUSCULAR | Status: AC
  Administered 2014-07-31 (×2): 25 mL via ORAL

## 2014-07-31 MED ORDER — NEPRO/CARBSTEADY PO LIQD
237.0000 mL | ORAL | Status: DC | PRN
  Filled 2014-07-31: qty 237

## 2014-07-31 MED ORDER — LIDOCAINE-PRILOCAINE 2.5-2.5 % EX CREA
1.0000 "application " | TOPICAL_CREAM | CUTANEOUS | Status: DC | PRN
  Filled 2014-07-31: qty 5

## 2014-07-31 MED ORDER — HEPARIN SODIUM (PORCINE) 1000 UNIT/ML DIALYSIS
100.0000 [IU]/kg | INTRAMUSCULAR | Status: DC | PRN
  Filled 2014-07-31: qty 6

## 2014-07-31 MED ORDER — SODIUM CHLORIDE 0.9 % IV SOLN: 100.0000 mL | INTRAVENOUS | Status: DC | PRN

## 2014-07-31 MED ORDER — IPRATROPIUM BROMIDE 0.02 % IN SOLN
0.5000 mg | Freq: Once | RESPIRATORY_TRACT | Status: AC
  Administered 2014-07-31: 0.5 mg via RESPIRATORY_TRACT
  Filled 2014-07-31: qty 2.5

## 2014-07-31 NOTE — Consult Note (Signed)
[name] 07/31/2014 Pearson Grippe, B Requesting Physician:  Rancour  Reason for Consult:  Resp Distress, HTN, ESRD HPI:  1F ESRD [facility] [place] via AVF presented with SOB and found to have HTN with SBPs in >180-190s.  Eval included CTA and 2V CXR negative for PE but with clear pulm edema and small pleural effusions.  Had low grade fever in ED and states has had subjective fevers for past few days. Recently treated for cystitis with cipro.  States has been having b/l lower abdominal pain/cramping.  Poor appetite.  CSY 12/2013 with mild sigmoid diverticulosis.  Has noticed some blood on toilet paper with nonpainful BMs.   EDW is 54.5.  Left last Tx 8/15 after 1.5h with post weight 56.3kg and BP 191/98 and 166/91 standing/sitting.  She states she stopped from feeling bad and having high BPs.  She had ED visit with neg CT of headShe usually completes full treatments.       ROS Balance of 12 systems is negative w/ exceptions as above  Outpt HD Orders Unit: [place] Days: [facility] Time: 4h Dialyzer: F180 EDW: 54.5 K/Ca: 2/2 Access:  AVF Needle Size: 16 BFR: 350 UF Proflie: none VDRA: Hectorol 4qTx EPO: Aranesp 120 qWk IV Fe: none Heparin: 3000 at start qTx  PMH  Past Medical History  Diagnosis Date  . Anxiety   . Blood transfusion   . Depression   . GERD (gastroesophageal reflux disease)   . Hyperlipidemia   . Hypertension   . Neuromuscular disorder     neuropathy hand and legs  . Weight loss, unintentional   . Adenomatous polyp of colon 10/2010  . Diverticula, colon   . Heart murmur   . CHF (congestive heart failure)   . 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  Had deceased donor kidney transplant in 1996.  Had some early rejection then stable function for years, then had slow decline of function and went back on hemodialysis in 2012.  Gets HD TTS schedule at [facility] on [facility] still using  L forearm AVF.     Marland Kitchen Anemia in CKD (chronic kidney disease) 11/07/2012   PSH  Past Surgical History  Procedure Laterality Date  . Kidney transplant  1996  . Back surgery    . Cervical fusion    . Av fistula placement      for dialysis  . Cholecystectomy  12/02/2012    Procedure: LAPAROSCOPIC CHOLECYSTECTOMY WITH INTRAOPERATIVE CHOLANGIOGRAM;  Surgeon: Edward Jolly, MD;  Location: [facility] OR;  Service: General;  Laterality: N/A;   FH  Family History  Problem Relation Age of Onset  . Colon cancer Brother   . Coronary artery disease Mother 60  . Esophageal cancer Neg Hx   . Stomach cancer Neg Hx   . Rectal cancer Neg Hx    SH  reports that she quit smoking about 22 years ago. Her smoking use included Cigarettes. She smoked 0.00 packs per day. She has never used smokeless tobacco. She reports that she does not drink alcohol or use illicit drugs. Allergies  Allergies  Allergen Reactions  . Sulfa Antibiotics Other (See Comments)    Both parents allergic-so will not take   Home medications Prior to Admission medications   Medication Sig Start Date End Date Taking? Authorizing Provider  allopurinol (ZYLOPRIM) 300 MG tablet Take 150 mg by mouth at bedtime.   Yes Historical  Provider, MD  calcium carbonate (TUMS - DOSED IN MG ELEMENTAL CALCIUM) 500 MG chewable tablet Chew 1 tablet by mouth 3 (three) times daily as needed. For acid   Yes Historical Provider, MD  cinacalcet (SENSIPAR) 30 MG tablet Take 30 mg by mouth daily.   Yes Historical Provider, MD  doxazosin (CARDURA) 8 MG tablet Take 8 mg by mouth daily as needed (only when BP is over 180).    Yes Historical Provider, MD  labetalol (NORMODYNE) 300 MG tablet Take 300 mg by mouth 2 (two) times daily. 11/09/12  Yes Velvet Bathe, MD  losartan (COZAAR) 100 MG tablet Take 100 mg by mouth at bedtime.    Yes Historical Provider, MD  Multiple Vitamin (MULTIVITAMIN WITH MINERALS) TABS tablet Take 1 tablet by mouth daily.   Yes Historical  Provider, MD  simvastatin (ZOCOR) 20 MG tablet Take 20 mg by mouth every evening.   Yes Historical Provider, MD  zolpidem (AMBIEN) 5 MG tablet Take 5 mg by mouth at bedtime.    Yes Historical Provider, MD  ciprofloxacin (CIPRO) 250 MG tablet Take 250 mg by mouth 2 (two) times daily. One day left on course    Historical Provider, MD    Current Medications Current Facility-Administered Medications  Medication Dose Route Frequency Provider Last Rate Last Dose  . 0.9 %  sodium chloride infusion  100 mL Intravenous PRN Rexene Agent, MD      . 0.9 %  sodium chloride infusion  100 mL Intravenous PRN Rexene Agent, MD      . feeding supplement (NEPRO CARB STEADY) liquid 237 mL  237 mL Oral PRN Rexene Agent, MD      . heparin injection 100 Units/kg  100 Units/kg Dialysis PRN Rexene Agent, MD      . lidocaine (PF) (XYLOCAINE) 1 % injection 5 mL  5 mL Intradermal PRN Rexene Agent, MD      . lidocaine-prilocaine (EMLA) cream 1 application  1 application Topical PRN Rexene Agent, MD      . pentafluoroprop-tetrafluoroeth Landry Dyke) aerosol 1 application  1 application Topical PRN Rexene Agent, MD       Current Outpatient Prescriptions  Medication Sig Dispense Refill  . allopurinol (ZYLOPRIM) 300 MG tablet Take 150 mg by mouth at bedtime.      . calcium carbonate (TUMS - DOSED IN MG ELEMENTAL CALCIUM) 500 MG chewable tablet Chew 1 tablet by mouth 3 (three) times daily as needed. For acid      . cinacalcet (SENSIPAR) 30 MG tablet Take 30 mg by mouth daily.      Marland Kitchen doxazosin (CARDURA) 8 MG tablet Take 8 mg by mouth daily as needed (only when BP is over 180).       . labetalol (NORMODYNE) 300 MG tablet Take 300 mg by mouth 2 (two) times daily.      Marland Kitchen losartan (COZAAR) 100 MG tablet Take 100 mg by mouth at bedtime.       . Multiple Vitamin (MULTIVITAMIN WITH MINERALS) TABS tablet Take 1 tablet by mouth daily.      . simvastatin (ZOCOR) 20 MG tablet Take 20 mg by mouth every evening.      .  zolpidem (AMBIEN) 5 MG tablet Take 5 mg by mouth at bedtime.       . ciprofloxacin (CIPRO) 250 MG tablet Take 250 mg by mouth 2 (two) times daily. One day left on course  CBC  Recent Labs Lab 07/28/14 1135 07/31/14 0450  WBC  --  8.7  NEUTROABS  --  6.8  HGB 11.9* 9.2*  HCT 35.0* 28.8*  MCV  --  89.2  PLT  --  [id number]   Basic Metabolic Panel  Recent Labs Lab 07/28/14 1135 07/31/14 0450  NA 135* 139  K 3.4* 5.0  CL 92* 92*  CO2  --  27  GLUCOSE 109* 103*  BUN 13 65*  CREATININE 3.20* 8.73*  CALCIUM  --  8.0*    Physical Exam  Blood pressure 200/115, pulse 89, temperature 100.1 F (37.8 C), temperature source Rectal, resp. rate 18, SpO2 97.00%. GEN: mild inc WOB, sitting in chair ENT: NCAT EYES: EOMI CV: RRR PULM: diminished in the bases, nl wob ABD: s/nd; mild TTP in lower abdomen w/ r/g; +bs SKIN: no rashes/lesions EXT:no LEE   A/P. 14F presenting with HTN urgency, pulm edema, resp distress, subjective / low grade fevers, and lower abd pain.  I suspect volume overload and ? Mild diverticulitis. 1. ESRD: Cont on schedule here [facility].  Might need to challenge with daily HD in house; follow 2. HTN/Vol: ED now, UF Goal 3L.  Probably need to lower EDW 3. Low grade / subjective fevers and lower abd pain: suspect diverticular disease.  Rec CT A/P for eval and to guide disposition; d/w ED MD 4. Anemia: Stable on ESA 5. 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

## 2014-07-31 NOTE — ED Provider Notes (Signed)
CSN: [id number]     Arrival date & time 07/31/14  0435 History   First MD Initiated Contact with Patient 07/31/14 0522     Chief Complaint  Patient presents with  . Shortness of Breath     (Consider location/radiation/quality/duration/timing/severity/associated sxs/prior Treatment) HPI Comments: Patient presents from home with 10 hours of shortness of breath. She is due for dialysis at 645 this morning. She felt like she could not make it until then. She feels like she cannot take a deep breath. Denies any chest pain. States compliance with her medications. Her dialysis session was completed on August 15. She had a half session on August 13. She was seen in the ED August 15 for hypertension headache and neck pain with a negative workup. She says her neck pain persists. She has a history of neck pain after having 2 back surgeries. Denies any focal weakness, numbness or tingling. Denies any chest pain. She's had a couple episodes of diarrhea at home with subjective fever. No abdominal pain or vomiting.  The history is provided by the patient.    Past Medical History  Diagnosis Date  . Anxiety   . Blood transfusion   . Depression   . GERD (gastroesophageal reflux disease)   . Hyperlipidemia   . Hypertension   . Neuromuscular disorder     neuropathy hand and legs  . Weight loss, unintentional   . Adenomatous polyp of colon 10/2010  . Diverticula, colon   . Heart murmur   . CHF (congestive heart failure)   . 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  Had deceased donor kidney transplant in 1996.  Had some early rejection then stable function for years, then had slow decline of function and went back on hemodialysis in 2012.  Gets HD TTS schedule at [facility] on [facility] still using L forearm AVF.     Marland Kitchen Anemia in CKD (chronic kidney disease) 11/07/2012   Past Surgical History  Procedure Laterality Date  .  Kidney transplant  1996  . Back surgery    . Cervical fusion    . Av fistula placement      for dialysis  . Cholecystectomy  12/02/2012    Procedure: LAPAROSCOPIC CHOLECYSTECTOMY WITH INTRAOPERATIVE CHOLANGIOGRAM;  Surgeon: Edward Jolly, MD;  Location: [facility] OR;  Service: General;  Laterality: N/A;   Family History  Problem Relation Age of Onset  . Colon cancer Brother   . Coronary artery disease Mother 44  . Esophageal cancer Neg Hx   . Stomach cancer Neg Hx   . Rectal cancer Neg Hx    History  Substance Use Topics  . Smoking status: Former Smoker    Types: Cigarettes    Quit date: 12/31/1991  . Smokeless tobacco: Never Used  . Alcohol Use: No   OB History   Grav Para Term Preterm Abortions TAB SAB Ect Mult Living                 Review of Systems  Constitutional: Negative for fever, activity change and appetite change.  Respiratory: Positive for shortness of breath. Negative for cough and chest tightness.   Cardiovascular: Negative for chest pain.  Gastrointestinal: Positive for diarrhea. Negative for nausea, vomiting and abdominal pain.  Genitourinary: Negative for dysuria and hematuria.  Musculoskeletal: Positive for neck pain. Negative for arthralgias and joint swelling.  Neurological: Positive for headaches. Negative for dizziness, weakness,  light-headedness and numbness.  A complete 10 system review of systems was obtained and all systems are negative except as noted in the HPI and PMH.      Allergies  Sulfa antibiotics  Home Medications   Prior to Admission medications   Medication Sig Start Date End Date Taking? Authorizing Provider  allopurinol (ZYLOPRIM) 300 MG tablet Take 150 mg by mouth at bedtime.   Yes Historical Provider, MD  calcium carbonate (TUMS - DOSED IN MG ELEMENTAL CALCIUM) 500 MG chewable tablet Chew 1 tablet by mouth 3 (three) times daily as needed. For acid   Yes Historical Provider, MD  cinacalcet (SENSIPAR) 30 MG tablet Take 30 mg by  mouth daily.   Yes Historical Provider, MD  doxazosin (CARDURA) 8 MG tablet Take 8 mg by mouth daily as needed (only when BP is over 180).    Yes Historical Provider, MD  labetalol (NORMODYNE) 300 MG tablet Take 300 mg by mouth 2 (two) times daily. 11/09/12  Yes Velvet Bathe, MD  losartan (COZAAR) 100 MG tablet Take 100 mg by mouth at bedtime.    Yes Historical Provider, MD  Multiple Vitamin (MULTIVITAMIN WITH MINERALS) TABS tablet Take 1 tablet by mouth daily.   Yes Historical Provider, MD  simvastatin (ZOCOR) 20 MG tablet Take 20 mg by mouth every evening.   Yes Historical Provider, MD  zolpidem (AMBIEN) 5 MG tablet Take 5 mg by mouth at bedtime.    Yes Historical Provider, MD  ciprofloxacin (CIPRO) 250 MG tablet Take 250 mg by mouth 2 (two) times daily. One day left on course    Historical Provider, MD   BP 200/115  Pulse 89  Temp(Src) 100.1 F (37.8 C) (Rectal)  Resp 18  SpO2 97% Physical Exam  Nursing note and vitals reviewed. Constitutional: She is oriented to person, place, and time. She appears well-developed and well-nourished. No distress.  No distress, speaking in full sentences  HENT:  Head: Normocephalic and atraumatic.  Mouth/Throat: Oropharynx is clear and moist. No oropharyngeal exudate.  Eyes: Conjunctivae and EOM are normal. Pupils are equal, round, and reactive to light.  Neck: Normal range of motion. Neck supple.  No meningismus.  Cardiovascular: Normal rate, regular rhythm, normal heart sounds and intact distal pulses.   No murmur heard. Pulmonary/Chest: Effort normal and breath sounds normal. No respiratory distress. She has no wheezes. She exhibits no tenderness.  Crackles at the bases  Abdominal: Soft. There is no tenderness. There is no rebound and no guarding.  Musculoskeletal: Normal range of motion. She exhibits no edema and no tenderness.  LUE fistula with thrill and bruit  Neurological: She is alert and oriented to person, place, and time. No cranial  nerve deficit. She exhibits normal muscle tone. Coordination normal.  No ataxia on finger to nose bilaterally. No pronator drift. 5/5 strength throughout. CN 2-12 intact. Negative Romberg. Equal grip strength. Sensation intact. Gait is normal.   Skin: Skin is warm.  Psychiatric: She has a normal mood and affect. Her behavior is normal.    ED Course  Procedures (including critical care time) Labs Review Labs Reviewed  CBC WITH DIFFERENTIAL - Abnormal; Notable for the following:    RBC 3.23 (*)    Hemoglobin 9.2 (*)    HCT 28.8 (*)    RDW 17.3 (*)    Neutrophils Relative % 78 (*)    All other components within normal limits  COMPREHENSIVE METABOLIC PANEL - Abnormal; Notable for the following:    Chloride 92 (*)  Glucose, Bld 103 (*)    BUN 65 (*)    Creatinine, Ser 8.73 (*)    Calcium 8.0 (*)    Albumin 3.0 (*)    Alkaline Phosphatase 150 (*)    GFR calc non Af Amer 4 (*)    GFR calc Af Amer 5 (*)    Anion gap 20 (*)    All other components within normal limits  PRO B NATRIURETIC PEPTIDE - Abnormal; Notable for the following:    Pro B Natriuretic peptide (BNP) >70000.0 (*)    All other components within normal limits  CULTURE, BLOOD (ROUTINE X 2)  CULTURE, BLOOD (ROUTINE X 2)  TROPONIN I    Imaging Review Dg Chest 2 View  07/31/2014   CLINICAL DATA:  Shortness of breath.  EXAM: CHEST  2 VIEW  COMPARISON:  CT 07/31/2014.  Chest x-ray 05/02/2014.  FINDINGS: Cardiomegaly with prominent pulmonary venous congestion and diffuse pulmonary interstitial edema present. Small pleural effusions are present. These finding are consistent congestive heart failure. Interstitial edema has increased from prior exams. No pneumothorax. No acute osseous abnormality. Cervical spine fusion. Old left posterior seventh rib fracture. Carotid atherosclerotic vascular disease.  IMPRESSION: 1. Progressive congestive heart failure with pulmonary interstitial edema. 2. Carotid vascular disease.    Electronically Signed   By: Marcello Moores  Register   On: 07/31/2014 07:30   Ct Angio Chest Pe W/cm &/or Wo Cm  07/31/2014   CLINICAL DATA:  Shortness of breath  EXAM: CT ANGIOGRAPHY CHEST WITH CONTRAST  TECHNIQUE: Multidetector CT imaging of the chest was performed using the standard protocol during bolus administration of intravenous contrast. Multiplanar CT image reconstructions and MIPs were obtained to evaluate the vascular anatomy.  CONTRAST:  98mL OMNIPAQUE IOHEXOL 350 MG/ML SOLN  COMPARISON:  05/02/2014  FINDINGS: THORACIC INLET/BODY WALL:  No acute abnormality.  MEDIASTINUM:  Cardiomegaly. Diffuse coronary artery atherosclerosis. No acute aortic findings including dissection. Haziness of the mediastinal and hilar lymphoid tissue, likely congestive given the following findings. Enlarged main pulmonary artery at 32 mm, compatible with pulmonary hypertension. No pulmonary artery filling defect.  LUNG WINDOWS:  Diffuse interlobular septal thickening consistent with pulmonary edema. Small bilateral pleural effusions. Diffuse bronchial wall thickening which is increased from prior, likely congestive. As noted previously there are scattered clusters of micro nodularity with occasional calcification. 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  UPPER ABDOMEN:  No acute findings.  OSSEOUS:  Diffuse trabecular coarsening with multilevel subchondral endplate erosive changes compatible with renal osteodystrophy.  Review of the MIP images confirms the above findings.  IMPRESSION: 1. Negative for pulmonary embolism. 2. Pulmonary edema and small pleural effusions. 3. Chronic lung changes discussed above.   Electronically Signed   By: Jorje Guild M.D.   On: 07/31/2014 07:13     EKG Interpretation   Date/Time:  Tuesday July 31 2014 04:43:46 EDT Ventricular Rate:  80 PR Interval:  165 QRS Duration: 78 QT Interval:  437 QTC Calculation: 504 R Axis:   68 Text  Interpretation:  Sinus rhythm Consider left ventricular hypertrophy  Prolonged QT interval No significant change was found Confirmed by Wyvonnia Dusky   MD, Coulton Schlink ([id number]) on 07/31/2014 4:55:19 AM      MDM   Final diagnoses:  Acute pulmonary edema  ESRD (end stage renal disease)   shortness of breath without chest pain. Last dialysis 2 days ago due for dialysis this morning.  No distress. Mildly increased work of breathing with crackles at the  bases. Chest x-ray shows pulmonary edema.  Patient with hypoxia and new oxygen requirement. CT scan negative for pulmonary embolism. Troponin negative. Potassium 5.0. EKG without hyperkalemic changes. Patient feels warm. His has a nonproductive cough. Rectal temp 100.1. Blood cultures obtained.  Discussed with Dr. Conley Canal. She requests nephrology evaluation patient will likely be able to dialyzed and go home.  Discussed with Dr. Joelyn Oms. He will evaluate for dialysis. Patient in no distress. She is breathing comfortably on nasal cannula.  BP 200/115  Pulse 89  Temp(Src) 100.1 F (37.8 C) (Rectal)  Resp 18  SpO2 97%    Ezequiel Essex, MD 07/31/14 [phone]

## 2014-07-31 NOTE — ED Notes (Signed)
Transported to dialysis.

## 2014-07-31 NOTE — Procedures (Signed)
I was present at this dialysis session. I have reviewed the session itself and made appropriate changes.   Pearson Grippe  MD 07/31/2014, 9:30 AM

## 2014-07-31 NOTE — ED Provider Notes (Signed)
Patient was initially seen by colleagues on the preceding shift, and emergent dialysis was arranged.  While at dialysis the patient continued to complain of abdominal discomfort.  With history of diverticulitis, CT scan was ordered, while the patient received dialysis, and upon return to the emergency department patient had CT scan to rule out this entity. Patient was awake alert, in no distress, hemodynamically stable on arrival to the emergency department after dialysis. CT did not show diverticulitis, and is largely consistent with prior studies. Patient is afebrile, with no leukocytosis.  On repeat exam the patient is awake, alert, and in no distress.  His mild hypertension, but is otherwise hemodynamically stable.  Patient was discharged, to follow up with her primary care team, dialysis team.   Carmin Muskrat, MD 07/31/14 [phone]

## 2014-07-31 NOTE — Discharge Instructions (Signed)
As discussed, it is important that you follow up as soon as possible with your physician for continued management of your condition. ° °If you develop any new, or concerning changes in your condition, please return to the emergency department immediately. ° °

## 2014-07-31 NOTE — ED Notes (Signed)
Pt from home. C/O SOB x 10 hrs. Last dialysis appt on Saturday was cut in half due to HTN. Pt is due for dialysis this am at 0645. Pt states that she "just can't take a full deep breath". Pt c/o neck pain x 1 week. Denies Chest pain.

## 2014-07-31 NOTE — ED Notes (Signed)
Provider the bedside.

## 2014-07-31 NOTE — ED Notes (Signed)
Received pt back to the ED. Placed on monitor. Ct called to inform that pt is ready for CT scan.

## 2014-07-31 NOTE — ED Notes (Signed)
Returned to room and placed on cardiac monitor. Pt's sister number [phone], [phone]

## 2014-07-31 NOTE — ED Notes (Signed)
O2 via [place] placed on patient at this time. 2L

## 2014-07-31 NOTE — ED Notes (Signed)
Nephrology at the bedside.

## 2014-08-06 LAB — CULTURE, BLOOD (ROUTINE X 2)
Culture: NO GROWTH
Culture: NO GROWTH

## 2014-08-10 DIAGNOSIS — H269 Unspecified cataract: Secondary | ICD-10-CM | POA: Diagnosis not present

## 2014-08-10 DIAGNOSIS — I120 Hypertensive chronic kidney disease with stage 5 chronic kidney disease or end stage renal disease: Secondary | ICD-10-CM | POA: Diagnosis not present

## 2014-08-10 DIAGNOSIS — F3289 Other specified depressive episodes: Secondary | ICD-10-CM | POA: Diagnosis not present

## 2014-08-10 DIAGNOSIS — N186 End stage renal disease: Secondary | ICD-10-CM | POA: Diagnosis not present

## 2014-08-10 DIAGNOSIS — R413 Other amnesia: Secondary | ICD-10-CM | POA: Diagnosis not present

## 2014-08-10 DIAGNOSIS — I619 Nontraumatic intracerebral hemorrhage, unspecified: Secondary | ICD-10-CM | POA: Diagnosis not present

## 2014-08-10 DIAGNOSIS — G4700 Insomnia, unspecified: Secondary | ICD-10-CM | POA: Diagnosis not present

## 2014-08-10 DIAGNOSIS — G9389 Other specified disorders of brain: Secondary | ICD-10-CM | POA: Diagnosis not present

## 2014-08-10 DIAGNOSIS — I150 Renovascular hypertension: Secondary | ICD-10-CM | POA: Diagnosis not present

## 2014-08-10 DIAGNOSIS — Z992 Dependence on renal dialysis: Secondary | ICD-10-CM | POA: Diagnosis not present

## 2014-08-10 DIAGNOSIS — I6350 Cerebral infarction due to unspecified occlusion or stenosis of unspecified cerebral artery: Secondary | ICD-10-CM | POA: Diagnosis not present

## 2014-08-10 DIAGNOSIS — R930 Abnormal findings on diagnostic imaging of skull and head, not elsewhere classified: Secondary | ICD-10-CM | POA: Diagnosis not present

## 2014-08-10 DIAGNOSIS — F329 Major depressive disorder, single episode, unspecified: Secondary | ICD-10-CM | POA: Diagnosis not present

## 2014-08-13 DIAGNOSIS — N186 End stage renal disease: Secondary | ICD-10-CM | POA: Diagnosis not present

## 2014-08-13 DIAGNOSIS — H2510 Age-related nuclear cataract, unspecified eye: Secondary | ICD-10-CM | POA: Diagnosis not present

## 2014-08-14 DIAGNOSIS — D509 Iron deficiency anemia, unspecified: Secondary | ICD-10-CM | POA: Diagnosis not present

## 2014-08-14 DIAGNOSIS — Z4931 Encounter for adequacy testing for hemodialysis: Secondary | ICD-10-CM | POA: Diagnosis not present

## 2014-08-14 DIAGNOSIS — Z992 Dependence on renal dialysis: Secondary | ICD-10-CM | POA: Diagnosis not present

## 2014-08-14 DIAGNOSIS — D631 Anemia in chronic kidney disease: Secondary | ICD-10-CM | POA: Diagnosis not present

## 2014-08-14 DIAGNOSIS — N2581 Secondary hyperparathyroidism of renal origin: Secondary | ICD-10-CM | POA: Diagnosis not present

## 2014-08-14 DIAGNOSIS — N039 Chronic nephritic syndrome with unspecified morphologic changes: Secondary | ICD-10-CM | POA: Diagnosis not present

## 2014-08-14 DIAGNOSIS — N186 End stage renal disease: Secondary | ICD-10-CM | POA: Diagnosis not present

## 2014-08-15 DIAGNOSIS — H2510 Age-related nuclear cataract, unspecified eye: Secondary | ICD-10-CM | POA: Diagnosis not present

## 2014-08-22 DIAGNOSIS — H2510 Age-related nuclear cataract, unspecified eye: Secondary | ICD-10-CM | POA: Diagnosis not present

## 2014-08-31 DIAGNOSIS — N186 End stage renal disease: Secondary | ICD-10-CM | POA: Diagnosis not present

## 2014-08-31 DIAGNOSIS — Z23 Encounter for immunization: Secondary | ICD-10-CM | POA: Diagnosis not present

## 2014-08-31 DIAGNOSIS — I509 Heart failure, unspecified: Secondary | ICD-10-CM | POA: Diagnosis not present

## 2014-08-31 DIAGNOSIS — I10 Essential (primary) hypertension: Secondary | ICD-10-CM | POA: Diagnosis not present

## 2014-09-12 DIAGNOSIS — N186 End stage renal disease: Secondary | ICD-10-CM | POA: Diagnosis not present

## 2014-09-13 DIAGNOSIS — Z4931 Encounter for adequacy testing for hemodialysis: Secondary | ICD-10-CM | POA: Diagnosis not present

## 2014-09-13 DIAGNOSIS — N186 End stage renal disease: Secondary | ICD-10-CM | POA: Diagnosis not present

## 2014-09-13 DIAGNOSIS — D509 Iron deficiency anemia, unspecified: Secondary | ICD-10-CM | POA: Diagnosis not present

## 2014-09-13 DIAGNOSIS — E8779 Other fluid overload: Secondary | ICD-10-CM | POA: Diagnosis not present

## 2014-09-13 DIAGNOSIS — N2581 Secondary hyperparathyroidism of renal origin: Secondary | ICD-10-CM | POA: Diagnosis not present

## 2014-09-13 DIAGNOSIS — D631 Anemia in chronic kidney disease: Secondary | ICD-10-CM | POA: Diagnosis not present

## 2014-09-24 DIAGNOSIS — R930 Abnormal findings on diagnostic imaging of skull and head, not elsewhere classified: Secondary | ICD-10-CM | POA: Diagnosis not present

## 2014-09-24 DIAGNOSIS — I150 Renovascular hypertension: Secondary | ICD-10-CM | POA: Diagnosis not present

## 2014-09-24 DIAGNOSIS — Z992 Dependence on renal dialysis: Secondary | ICD-10-CM | POA: Diagnosis not present

## 2014-09-24 DIAGNOSIS — I340 Nonrheumatic mitral (valve) insufficiency: Secondary | ICD-10-CM | POA: Diagnosis not present

## 2014-09-24 DIAGNOSIS — N186 End stage renal disease: Secondary | ICD-10-CM | POA: Diagnosis not present

## 2014-09-24 DIAGNOSIS — R413 Other amnesia: Secondary | ICD-10-CM | POA: Diagnosis not present

## 2014-09-24 DIAGNOSIS — I639 Cerebral infarction, unspecified: Secondary | ICD-10-CM | POA: Diagnosis not present

## 2014-10-10 DIAGNOSIS — M545 Low back pain: Secondary | ICD-10-CM | POA: Diagnosis not present

## 2014-10-13 DIAGNOSIS — Z992 Dependence on renal dialysis: Secondary | ICD-10-CM | POA: Diagnosis not present

## 2014-10-13 DIAGNOSIS — N186 End stage renal disease: Secondary | ICD-10-CM | POA: Diagnosis not present

## 2014-10-16 DIAGNOSIS — D631 Anemia in chronic kidney disease: Secondary | ICD-10-CM | POA: Diagnosis not present

## 2014-10-16 DIAGNOSIS — D509 Iron deficiency anemia, unspecified: Secondary | ICD-10-CM | POA: Diagnosis not present

## 2014-10-16 DIAGNOSIS — N186 End stage renal disease: Secondary | ICD-10-CM | POA: Diagnosis not present

## 2014-10-16 DIAGNOSIS — N2581 Secondary hyperparathyroidism of renal origin: Secondary | ICD-10-CM | POA: Diagnosis not present

## 2014-10-31 DIAGNOSIS — F99 Mental disorder, not otherwise specified: Secondary | ICD-10-CM | POA: Diagnosis not present

## 2014-10-31 DIAGNOSIS — Z9189 Other specified personal risk factors, not elsewhere classified: Secondary | ICD-10-CM | POA: Diagnosis not present

## 2014-10-31 DIAGNOSIS — Z8659 Personal history of other mental and behavioral disorders: Secondary | ICD-10-CM | POA: Diagnosis not present

## 2014-10-31 DIAGNOSIS — N186 End stage renal disease: Secondary | ICD-10-CM | POA: Diagnosis not present

## 2014-10-31 DIAGNOSIS — I679 Cerebrovascular disease, unspecified: Secondary | ICD-10-CM | POA: Diagnosis not present

## 2014-10-31 DIAGNOSIS — I120 Hypertensive chronic kidney disease with stage 5 chronic kidney disease or end stage renal disease: Secondary | ICD-10-CM | POA: Diagnosis not present

## 2014-10-31 DIAGNOSIS — Z992 Dependence on renal dialysis: Secondary | ICD-10-CM | POA: Diagnosis not present

## 2014-10-31 DIAGNOSIS — Z789 Other specified health status: Secondary | ICD-10-CM | POA: Diagnosis not present

## 2014-10-31 DIAGNOSIS — F329 Major depressive disorder, single episode, unspecified: Secondary | ICD-10-CM | POA: Diagnosis not present

## 2014-10-31 DIAGNOSIS — G4700 Insomnia, unspecified: Secondary | ICD-10-CM | POA: Diagnosis not present

## 2014-10-31 DIAGNOSIS — R413 Other amnesia: Secondary | ICD-10-CM | POA: Diagnosis not present

## 2014-11-12 DIAGNOSIS — N186 End stage renal disease: Secondary | ICD-10-CM | POA: Diagnosis not present

## 2014-11-12 DIAGNOSIS — Z992 Dependence on renal dialysis: Secondary | ICD-10-CM | POA: Diagnosis not present

## 2014-11-13 DIAGNOSIS — N2581 Secondary hyperparathyroidism of renal origin: Secondary | ICD-10-CM | POA: Diagnosis not present

## 2014-11-13 DIAGNOSIS — D631 Anemia in chronic kidney disease: Secondary | ICD-10-CM | POA: Diagnosis not present

## 2014-11-13 DIAGNOSIS — N186 End stage renal disease: Secondary | ICD-10-CM | POA: Diagnosis not present

## 2014-11-13 DIAGNOSIS — D509 Iron deficiency anemia, unspecified: Secondary | ICD-10-CM | POA: Diagnosis not present

## 2014-11-14 ENCOUNTER — Ambulatory Visit (INDEPENDENT_AMBULATORY_CARE_PROVIDER_SITE_OTHER): Payer: Medicare Other | Admitting: Family Medicine

## 2014-11-14 VITALS — BP 162/90 | HR 74 | Temp 98.0°F | Resp 18 | Ht 62.0 in | Wt 124.0 lb

## 2014-11-14 DIAGNOSIS — N2889 Other specified disorders of kidney and ureter: Secondary | ICD-10-CM

## 2014-11-14 DIAGNOSIS — I151 Hypertension secondary to other renal disorders: Secondary | ICD-10-CM | POA: Diagnosis not present

## 2014-11-14 DIAGNOSIS — N186 End stage renal disease: Secondary | ICD-10-CM

## 2014-11-14 MED ORDER — DOXAZOSIN MESYLATE 8 MG PO TABS
8.0000 mg | ORAL_TABLET | Freq: Two times a day (BID) | ORAL | Status: DC
Start: 2014-11-14 — End: 2015-11-17

## 2014-11-14 NOTE — Progress Notes (Signed)
[name] - 63 y.o. female MRN [id number]  Date of birth: July 19, 1951  SUBJECTIVE:  Including CC & ROS.  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 Patient also reports a recent history of TIAs and is currently being worked up by neurology at [facility]. Patient presents today because her home blood pressure cuff which had readings of 202/107 with a heart rate of 69, 191/100 with heart rate of 70. Patient presented office today we checked her blood pressure here which was found to be 162/90 with a heart rate of 74. Patient also checked her blood pressure with her own blood pressure cuff in office today and the readings were not consistent with our blood pressure reading at 190/98 and 189/96. Patient's concerned that her blood pressure cuff is not accurate. Her current regimen for hypertension includes Cozaar 100 mg at bedtime and prior to dialysis, labetalol 300 mg twice a day on her off days and when necessary on the evening of dialysis blood pressure is elevated. He is also on doxazosin 8mg  BID, but is concerned that this medication is no longer working because her prescription expired in 2014.    ROS:  Constitutional:  No fever, chills, or fatigue.  Respiratory:  No shortness of breath, cough, or wheezing Cardiovascular:  No palpitations, chest pain or syncope Gastrointestinal:  No nausea, no abdominal pain Neurologic: Denies any headaches, blurred vision, slurred speech, or weakness Review of systems otherwise negative except for what is stated in HPI  HISTORY: Past Medical, Surgical, Social, and Family History Reviewed & Updated per EMR. Pertinent Historical Findings include: End-stage kidney disease on hemodialysis, hypertension, hyperlipidemia, TIAs  PHYSICAL EXAM:  VS: BP:(!) 162/90 mmHg   HR:74bpm  TEMP:98 F (36.7 C)(Oral)  RESP:98 %  HT:5\' 2"  (157.5 cm)   WT:124 lb (56.246 kg)  BMI:22.7 PHYSICAL EXAM: General:  Alert and oriented, No acute distress.   HENT:  Normocephalic, Oral mucosa is moist.   Respiratory:  Lungs are clear to auscultation, Respirations are non-labored, Symmetrical chest wall expansion.   Cardiovascular:  Normal rate, Regular rhythm, No murmur, Good pulses equal in all extremities, No edema.   Gastrointestinal:  Soft, Non-tender, Non-distended, Normal bowel sounds, No organomegaly.   Integumentary:  Warm, Dry, No rash.   Neurologic:  Alert, Oriented, No focal defects Psychiatric:  Cooperative, Appropriate mood & affect.    ASSESSMENT & PLAN:  Patient is a 63 year old female with compensated medical history. Presents today for uncontrolled hypertension. After discussing patient's history and reviewing her recent blood pressure records I suspect that her blood pressure cuff from home is inaccurate and reading higher than normal. Her blood pressure today in the office at 162/90 and at dialysis on Tuesday at 111/72 or more consistent what she actually has is a blood pressure. Advised patient to continue current blood pressure regimen as directed by her nephrologist. When seen at dialysis tomorrow to discuss with nephrology her recent blood pressure concerns. Refilled her expired doxazsin at current dose of 80 mg twice a day. Advised patient she will need to continue to get refills from her PCPs office or nephrology office. Also advised patient to get a new blood for home use and obtain some readings to help nephrology accurately determine the need for any adjustments of medication.  We spent greater than 50% of our 45 minute office visit in counseling education regarding the patient current clinical  problem, complicated medical history, risks and benefits of treatment options, and recommend plans for treatment or further evaluation

## 2014-11-15 DIAGNOSIS — N186 End stage renal disease: Secondary | ICD-10-CM | POA: Diagnosis not present

## 2014-11-15 DIAGNOSIS — N2581 Secondary hyperparathyroidism of renal origin: Secondary | ICD-10-CM | POA: Diagnosis not present

## 2014-11-15 DIAGNOSIS — D509 Iron deficiency anemia, unspecified: Secondary | ICD-10-CM | POA: Diagnosis not present

## 2014-11-15 DIAGNOSIS — D631 Anemia in chronic kidney disease: Secondary | ICD-10-CM | POA: Diagnosis not present

## 2014-11-15 NOTE — Progress Notes (Signed)
Patient discussed with Dr. Ollen Barges. Agree with assessment and plan of care per her note.

## 2014-11-16 DIAGNOSIS — R413 Other amnesia: Secondary | ICD-10-CM | POA: Diagnosis not present

## 2014-11-16 DIAGNOSIS — I619 Nontraumatic intracerebral hemorrhage, unspecified: Secondary | ICD-10-CM | POA: Diagnosis not present

## 2014-11-16 DIAGNOSIS — Z992 Dependence on renal dialysis: Secondary | ICD-10-CM | POA: Diagnosis not present

## 2014-11-16 DIAGNOSIS — I639 Cerebral infarction, unspecified: Secondary | ICD-10-CM | POA: Diagnosis not present

## 2014-11-16 DIAGNOSIS — I150 Renovascular hypertension: Secondary | ICD-10-CM | POA: Diagnosis not present

## 2014-11-16 DIAGNOSIS — N186 End stage renal disease: Secondary | ICD-10-CM | POA: Diagnosis not present

## 2014-11-16 DIAGNOSIS — R930 Abnormal findings on diagnostic imaging of skull and head, not elsewhere classified: Secondary | ICD-10-CM | POA: Diagnosis not present

## 2014-11-16 DIAGNOSIS — I6529 Occlusion and stenosis of unspecified carotid artery: Secondary | ICD-10-CM | POA: Diagnosis not present

## 2014-11-16 DIAGNOSIS — I120 Hypertensive chronic kidney disease with stage 5 chronic kidney disease or end stage renal disease: Secondary | ICD-10-CM | POA: Diagnosis not present

## 2014-11-17 DIAGNOSIS — D631 Anemia in chronic kidney disease: Secondary | ICD-10-CM | POA: Diagnosis not present

## 2014-11-17 DIAGNOSIS — N186 End stage renal disease: Secondary | ICD-10-CM | POA: Diagnosis not present

## 2014-11-17 DIAGNOSIS — D509 Iron deficiency anemia, unspecified: Secondary | ICD-10-CM | POA: Diagnosis not present

## 2014-11-17 DIAGNOSIS — N2581 Secondary hyperparathyroidism of renal origin: Secondary | ICD-10-CM | POA: Diagnosis not present

## 2014-11-20 DIAGNOSIS — D631 Anemia in chronic kidney disease: Secondary | ICD-10-CM | POA: Diagnosis not present

## 2014-11-20 DIAGNOSIS — N2581 Secondary hyperparathyroidism of renal origin: Secondary | ICD-10-CM | POA: Diagnosis not present

## 2014-11-20 DIAGNOSIS — D509 Iron deficiency anemia, unspecified: Secondary | ICD-10-CM | POA: Diagnosis not present

## 2014-11-20 DIAGNOSIS — N186 End stage renal disease: Secondary | ICD-10-CM | POA: Diagnosis not present

## 2014-11-22 DIAGNOSIS — N2581 Secondary hyperparathyroidism of renal origin: Secondary | ICD-10-CM | POA: Diagnosis not present

## 2014-11-22 DIAGNOSIS — D631 Anemia in chronic kidney disease: Secondary | ICD-10-CM | POA: Diagnosis not present

## 2014-11-22 DIAGNOSIS — D509 Iron deficiency anemia, unspecified: Secondary | ICD-10-CM | POA: Diagnosis not present

## 2014-11-22 DIAGNOSIS — N186 End stage renal disease: Secondary | ICD-10-CM | POA: Diagnosis not present

## 2014-11-24 DIAGNOSIS — D509 Iron deficiency anemia, unspecified: Secondary | ICD-10-CM | POA: Diagnosis not present

## 2014-11-24 DIAGNOSIS — N186 End stage renal disease: Secondary | ICD-10-CM | POA: Diagnosis not present

## 2014-11-24 DIAGNOSIS — N2581 Secondary hyperparathyroidism of renal origin: Secondary | ICD-10-CM | POA: Diagnosis not present

## 2014-11-24 DIAGNOSIS — D631 Anemia in chronic kidney disease: Secondary | ICD-10-CM | POA: Diagnosis not present

## 2014-11-27 DIAGNOSIS — D631 Anemia in chronic kidney disease: Secondary | ICD-10-CM | POA: Diagnosis not present

## 2014-11-27 DIAGNOSIS — N2581 Secondary hyperparathyroidism of renal origin: Secondary | ICD-10-CM | POA: Diagnosis not present

## 2014-11-27 DIAGNOSIS — N186 End stage renal disease: Secondary | ICD-10-CM | POA: Diagnosis not present

## 2014-11-27 DIAGNOSIS — D509 Iron deficiency anemia, unspecified: Secondary | ICD-10-CM | POA: Diagnosis not present

## 2014-11-29 DIAGNOSIS — D509 Iron deficiency anemia, unspecified: Secondary | ICD-10-CM | POA: Diagnosis not present

## 2014-11-29 DIAGNOSIS — N2581 Secondary hyperparathyroidism of renal origin: Secondary | ICD-10-CM | POA: Diagnosis not present

## 2014-11-29 DIAGNOSIS — N186 End stage renal disease: Secondary | ICD-10-CM | POA: Diagnosis not present

## 2014-11-29 DIAGNOSIS — D631 Anemia in chronic kidney disease: Secondary | ICD-10-CM | POA: Diagnosis not present

## 2014-11-30 DIAGNOSIS — M8580 Other specified disorders of bone density and structure, unspecified site: Secondary | ICD-10-CM | POA: Diagnosis not present

## 2014-11-30 DIAGNOSIS — E782 Mixed hyperlipidemia: Secondary | ICD-10-CM | POA: Diagnosis not present

## 2014-11-30 DIAGNOSIS — D638 Anemia in other chronic diseases classified elsewhere: Secondary | ICD-10-CM | POA: Diagnosis not present

## 2014-11-30 DIAGNOSIS — N186 End stage renal disease: Secondary | ICD-10-CM | POA: Diagnosis not present

## 2014-11-30 DIAGNOSIS — I10 Essential (primary) hypertension: Secondary | ICD-10-CM | POA: Diagnosis not present

## 2014-11-30 DIAGNOSIS — I509 Heart failure, unspecified: Secondary | ICD-10-CM | POA: Diagnosis not present

## 2014-12-01 DIAGNOSIS — N2581 Secondary hyperparathyroidism of renal origin: Secondary | ICD-10-CM | POA: Diagnosis not present

## 2014-12-01 DIAGNOSIS — D509 Iron deficiency anemia, unspecified: Secondary | ICD-10-CM | POA: Diagnosis not present

## 2014-12-01 DIAGNOSIS — D631 Anemia in chronic kidney disease: Secondary | ICD-10-CM | POA: Diagnosis not present

## 2014-12-01 DIAGNOSIS — N186 End stage renal disease: Secondary | ICD-10-CM | POA: Diagnosis not present

## 2014-12-04 DIAGNOSIS — D509 Iron deficiency anemia, unspecified: Secondary | ICD-10-CM | POA: Diagnosis not present

## 2014-12-04 DIAGNOSIS — N186 End stage renal disease: Secondary | ICD-10-CM | POA: Diagnosis not present

## 2014-12-04 DIAGNOSIS — N2581 Secondary hyperparathyroidism of renal origin: Secondary | ICD-10-CM | POA: Diagnosis not present

## 2014-12-04 DIAGNOSIS — D631 Anemia in chronic kidney disease: Secondary | ICD-10-CM | POA: Diagnosis not present

## 2014-12-06 DIAGNOSIS — D509 Iron deficiency anemia, unspecified: Secondary | ICD-10-CM | POA: Diagnosis not present

## 2014-12-06 DIAGNOSIS — N186 End stage renal disease: Secondary | ICD-10-CM | POA: Diagnosis not present

## 2014-12-06 DIAGNOSIS — D631 Anemia in chronic kidney disease: Secondary | ICD-10-CM | POA: Diagnosis not present

## 2014-12-06 DIAGNOSIS — N2581 Secondary hyperparathyroidism of renal origin: Secondary | ICD-10-CM | POA: Diagnosis not present

## 2014-12-09 DIAGNOSIS — D631 Anemia in chronic kidney disease: Secondary | ICD-10-CM | POA: Diagnosis not present

## 2014-12-09 DIAGNOSIS — N2581 Secondary hyperparathyroidism of renal origin: Secondary | ICD-10-CM | POA: Diagnosis not present

## 2014-12-09 DIAGNOSIS — D509 Iron deficiency anemia, unspecified: Secondary | ICD-10-CM | POA: Diagnosis not present

## 2014-12-09 DIAGNOSIS — N186 End stage renal disease: Secondary | ICD-10-CM | POA: Diagnosis not present

## 2014-12-09 IMAGING — CR DG CHEST 2V
2 series · 2 of 2 positions shown · non-contrast
Comparison: CT chest 04/01/2013.  PA and lateral chest 05/17/2013.

CLINICAL DATA: Chest pain for 2 days.

EXAM:
CHEST  2 VIEW

[w chest pa]
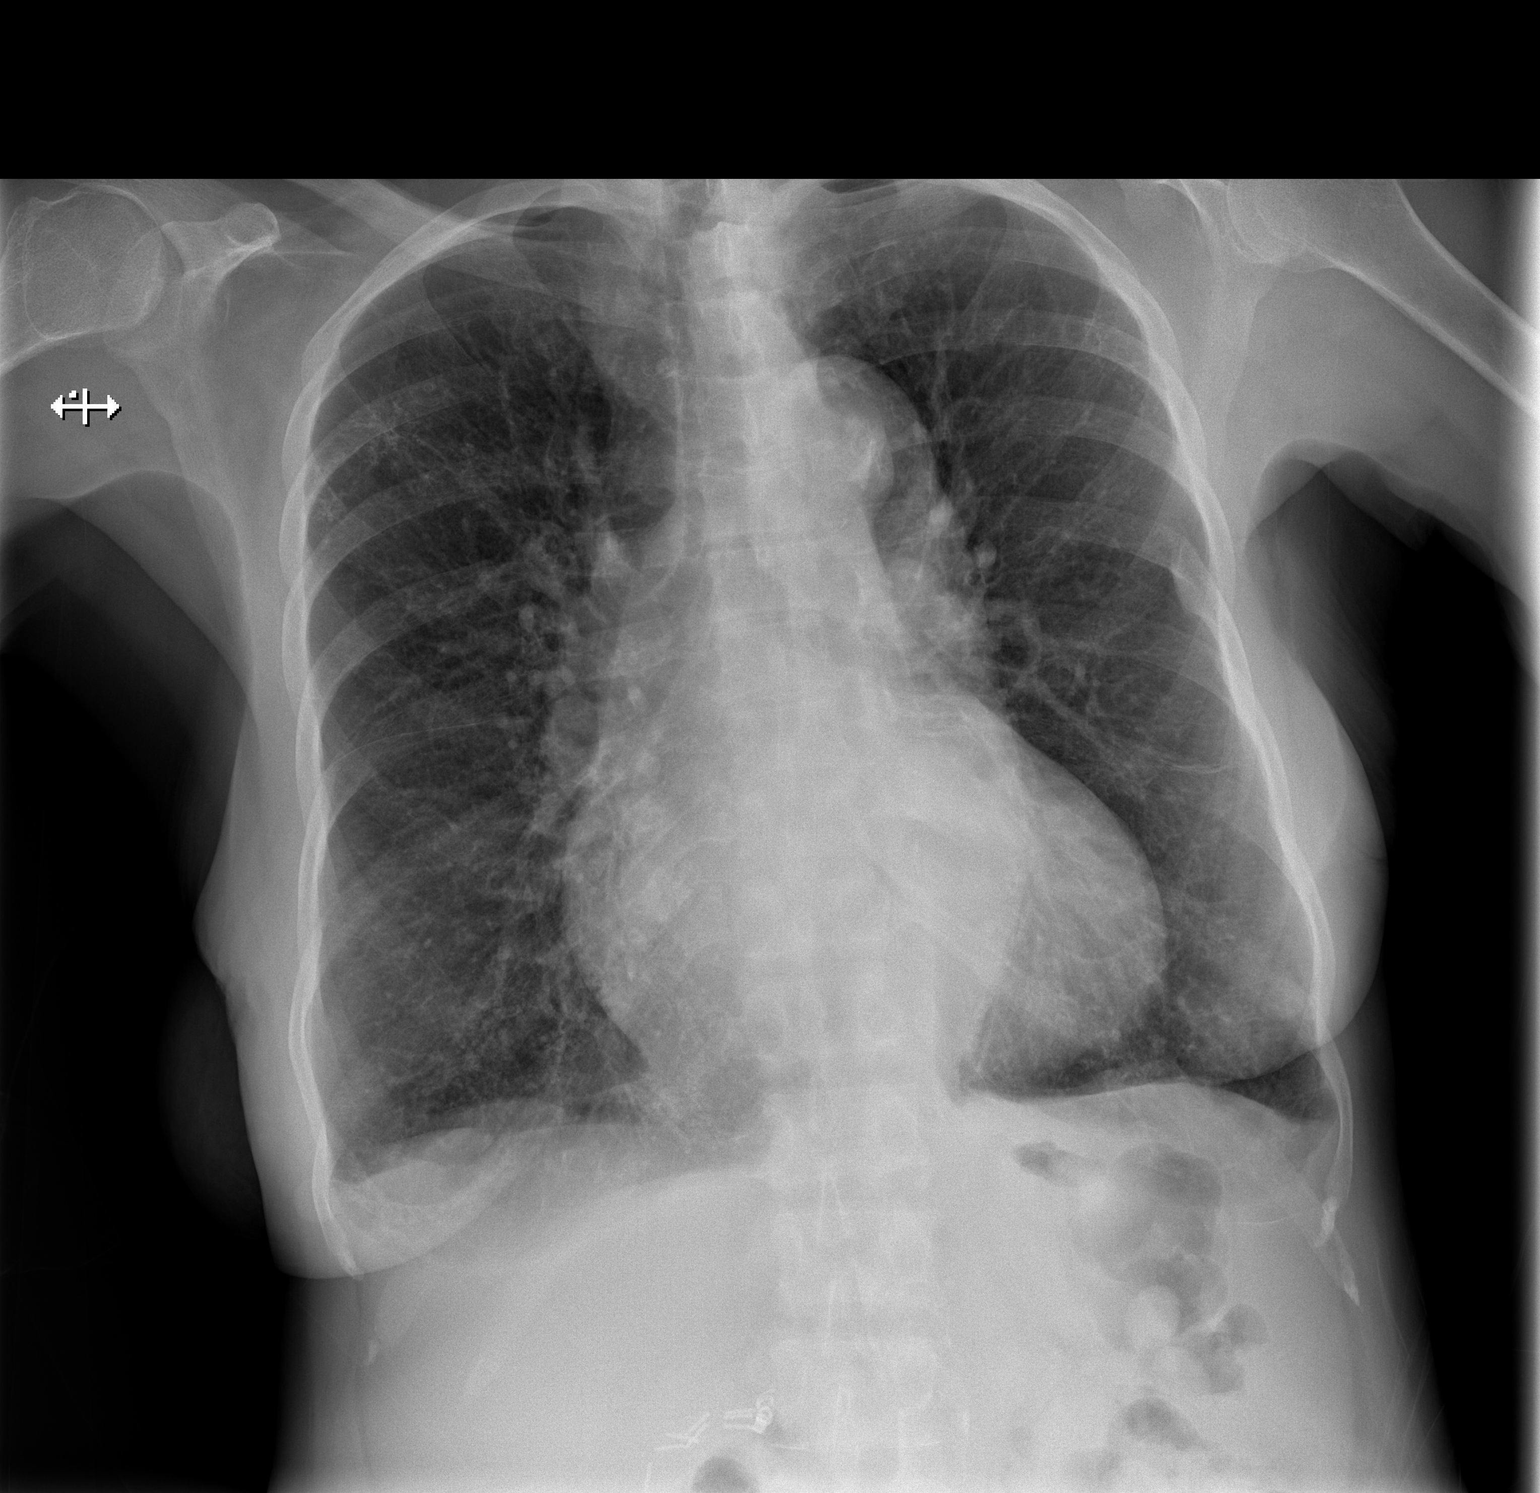

[w chest lat]
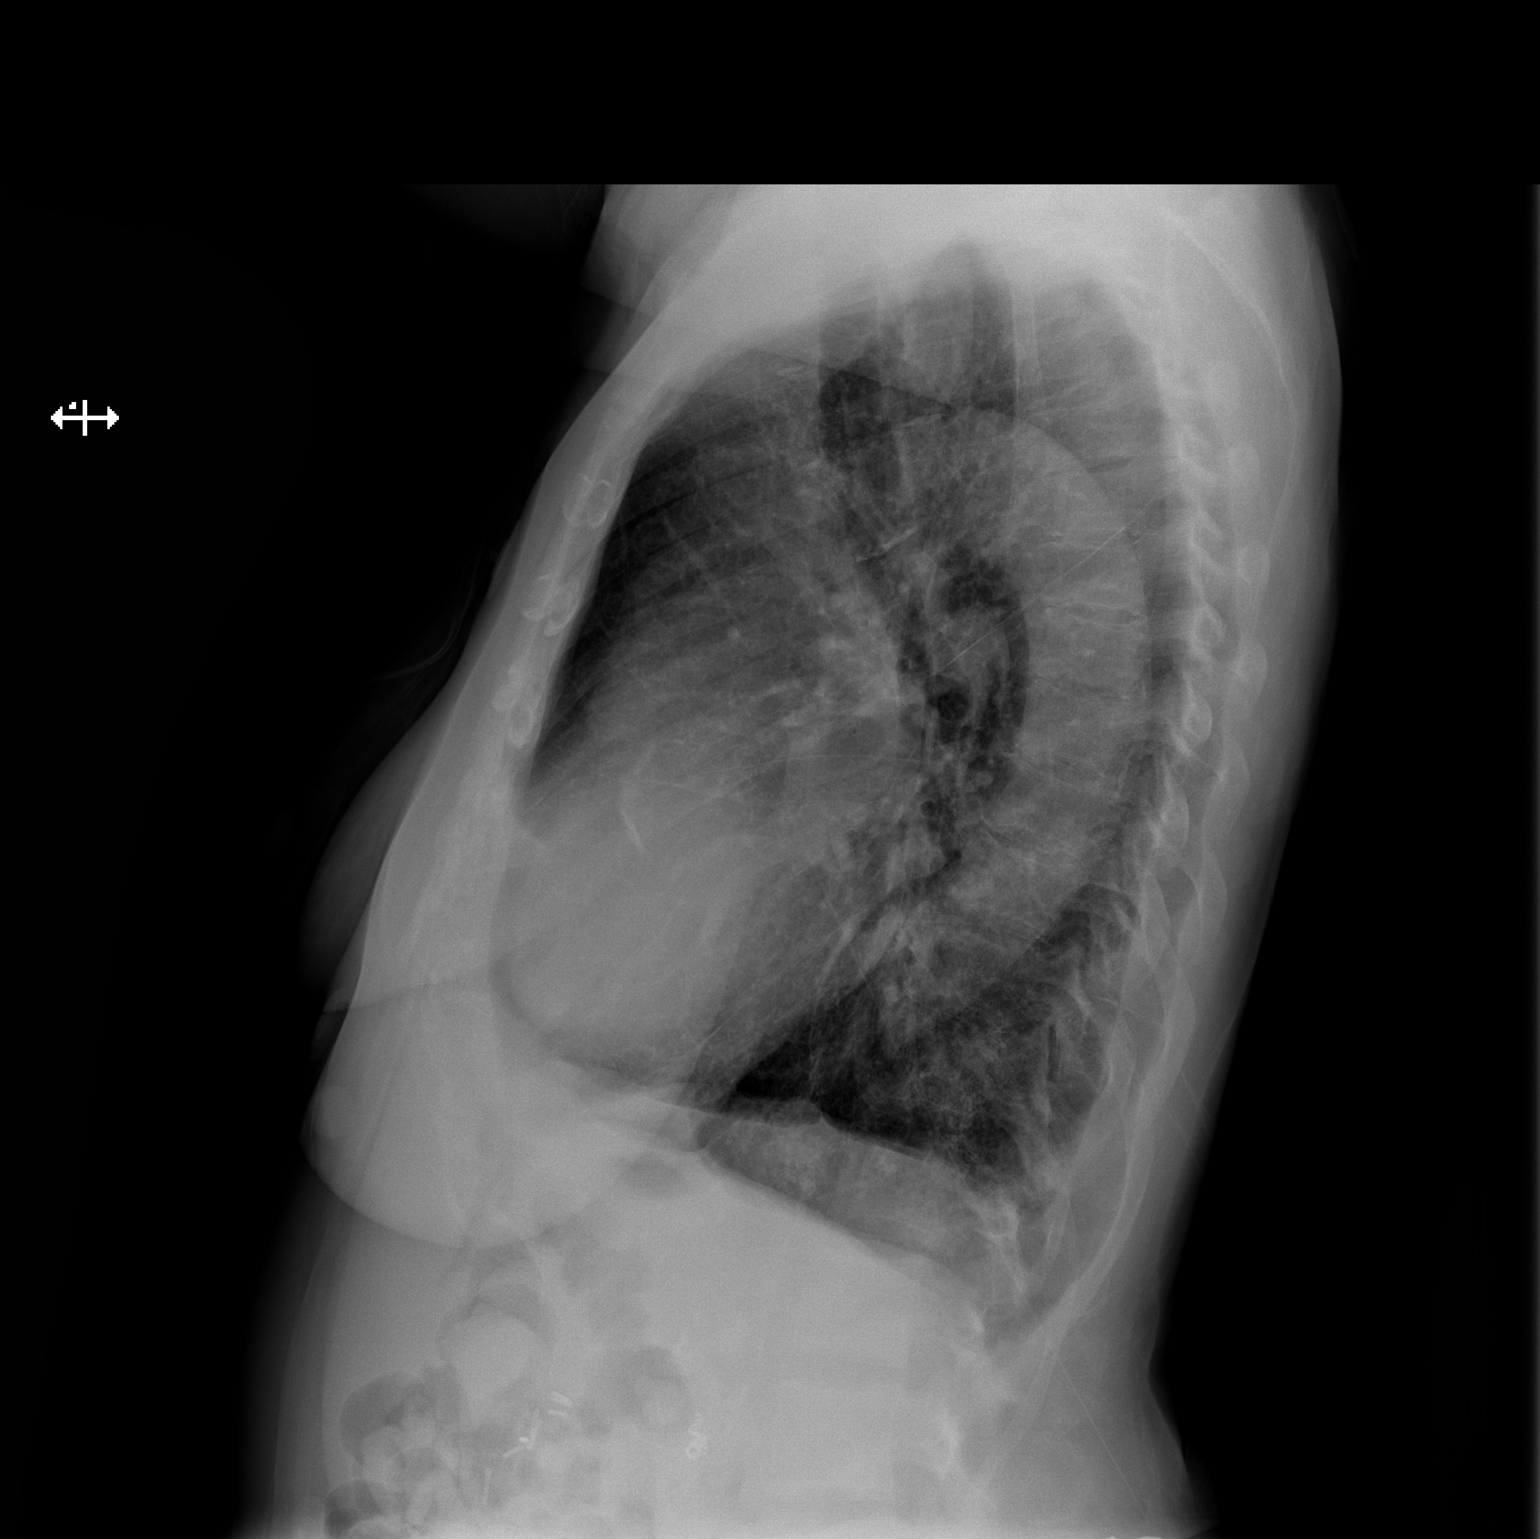

[2 of 2 positions shown; findings below may reference images not displayed]

FINDINGS: There is cardiomegaly without edema. Micronodularity in the right
upper lobe is unchanged and consistent with old infectious or
inflammatory process. The lungs are otherwise clear. Remote left rib
fracture noted.
IMPRESSION: Cardiomegaly without acute disease.

## 2014-12-11 DIAGNOSIS — D509 Iron deficiency anemia, unspecified: Secondary | ICD-10-CM | POA: Diagnosis not present

## 2014-12-11 DIAGNOSIS — N186 End stage renal disease: Secondary | ICD-10-CM | POA: Diagnosis not present

## 2014-12-11 DIAGNOSIS — N2581 Secondary hyperparathyroidism of renal origin: Secondary | ICD-10-CM | POA: Diagnosis not present

## 2014-12-11 DIAGNOSIS — D631 Anemia in chronic kidney disease: Secondary | ICD-10-CM | POA: Diagnosis not present

## 2014-12-13 DIAGNOSIS — N186 End stage renal disease: Secondary | ICD-10-CM | POA: Diagnosis not present

## 2014-12-13 DIAGNOSIS — D631 Anemia in chronic kidney disease: Secondary | ICD-10-CM | POA: Diagnosis not present

## 2014-12-13 DIAGNOSIS — D509 Iron deficiency anemia, unspecified: Secondary | ICD-10-CM | POA: Diagnosis not present

## 2014-12-13 DIAGNOSIS — N2581 Secondary hyperparathyroidism of renal origin: Secondary | ICD-10-CM | POA: Diagnosis not present

## 2014-12-13 DIAGNOSIS — Z992 Dependence on renal dialysis: Secondary | ICD-10-CM | POA: Diagnosis not present

## 2014-12-16 DIAGNOSIS — D509 Iron deficiency anemia, unspecified: Secondary | ICD-10-CM | POA: Diagnosis not present

## 2014-12-16 DIAGNOSIS — N186 End stage renal disease: Secondary | ICD-10-CM | POA: Diagnosis not present

## 2014-12-16 DIAGNOSIS — N2581 Secondary hyperparathyroidism of renal origin: Secondary | ICD-10-CM | POA: Diagnosis not present

## 2014-12-16 DIAGNOSIS — D631 Anemia in chronic kidney disease: Secondary | ICD-10-CM | POA: Diagnosis not present

## 2014-12-18 DIAGNOSIS — D631 Anemia in chronic kidney disease: Secondary | ICD-10-CM | POA: Diagnosis not present

## 2014-12-18 DIAGNOSIS — N186 End stage renal disease: Secondary | ICD-10-CM | POA: Diagnosis not present

## 2014-12-18 DIAGNOSIS — N2581 Secondary hyperparathyroidism of renal origin: Secondary | ICD-10-CM | POA: Diagnosis not present

## 2014-12-18 DIAGNOSIS — D509 Iron deficiency anemia, unspecified: Secondary | ICD-10-CM | POA: Diagnosis not present

## 2014-12-20 DIAGNOSIS — N2581 Secondary hyperparathyroidism of renal origin: Secondary | ICD-10-CM | POA: Diagnosis not present

## 2014-12-20 DIAGNOSIS — D509 Iron deficiency anemia, unspecified: Secondary | ICD-10-CM | POA: Diagnosis not present

## 2014-12-20 DIAGNOSIS — D631 Anemia in chronic kidney disease: Secondary | ICD-10-CM | POA: Diagnosis not present

## 2014-12-20 DIAGNOSIS — N186 End stage renal disease: Secondary | ICD-10-CM | POA: Diagnosis not present

## 2014-12-22 DIAGNOSIS — D631 Anemia in chronic kidney disease: Secondary | ICD-10-CM | POA: Diagnosis not present

## 2014-12-22 DIAGNOSIS — D509 Iron deficiency anemia, unspecified: Secondary | ICD-10-CM | POA: Diagnosis not present

## 2014-12-22 DIAGNOSIS — N186 End stage renal disease: Secondary | ICD-10-CM | POA: Diagnosis not present

## 2014-12-22 DIAGNOSIS — N2581 Secondary hyperparathyroidism of renal origin: Secondary | ICD-10-CM | POA: Diagnosis not present

## 2014-12-25 DIAGNOSIS — Z1231 Encounter for screening mammogram for malignant neoplasm of breast: Secondary | ICD-10-CM | POA: Diagnosis not present

## 2014-12-25 DIAGNOSIS — N2581 Secondary hyperparathyroidism of renal origin: Secondary | ICD-10-CM | POA: Diagnosis not present

## 2014-12-25 DIAGNOSIS — N186 End stage renal disease: Secondary | ICD-10-CM | POA: Diagnosis not present

## 2014-12-25 DIAGNOSIS — D631 Anemia in chronic kidney disease: Secondary | ICD-10-CM | POA: Diagnosis not present

## 2014-12-25 DIAGNOSIS — D509 Iron deficiency anemia, unspecified: Secondary | ICD-10-CM | POA: Diagnosis not present

## 2014-12-26 ENCOUNTER — Encounter: Payer: Self-pay | Admitting: Internal Medicine

## 2014-12-27 DIAGNOSIS — N2581 Secondary hyperparathyroidism of renal origin: Secondary | ICD-10-CM | POA: Diagnosis not present

## 2014-12-27 DIAGNOSIS — D509 Iron deficiency anemia, unspecified: Secondary | ICD-10-CM | POA: Diagnosis not present

## 2014-12-27 DIAGNOSIS — D631 Anemia in chronic kidney disease: Secondary | ICD-10-CM | POA: Diagnosis not present

## 2014-12-27 DIAGNOSIS — N186 End stage renal disease: Secondary | ICD-10-CM | POA: Diagnosis not present

## 2014-12-29 DIAGNOSIS — D631 Anemia in chronic kidney disease: Secondary | ICD-10-CM | POA: Diagnosis not present

## 2014-12-29 DIAGNOSIS — N186 End stage renal disease: Secondary | ICD-10-CM | POA: Diagnosis not present

## 2014-12-29 DIAGNOSIS — D509 Iron deficiency anemia, unspecified: Secondary | ICD-10-CM | POA: Diagnosis not present

## 2014-12-29 DIAGNOSIS — N2581 Secondary hyperparathyroidism of renal origin: Secondary | ICD-10-CM | POA: Diagnosis not present

## 2015-01-01 DIAGNOSIS — N186 End stage renal disease: Secondary | ICD-10-CM | POA: Diagnosis not present

## 2015-01-01 DIAGNOSIS — D631 Anemia in chronic kidney disease: Secondary | ICD-10-CM | POA: Diagnosis not present

## 2015-01-01 DIAGNOSIS — H1132 Conjunctival hemorrhage, left eye: Secondary | ICD-10-CM | POA: Diagnosis not present

## 2015-01-01 DIAGNOSIS — D509 Iron deficiency anemia, unspecified: Secondary | ICD-10-CM | POA: Diagnosis not present

## 2015-01-01 DIAGNOSIS — N2581 Secondary hyperparathyroidism of renal origin: Secondary | ICD-10-CM | POA: Diagnosis not present

## 2015-01-02 ENCOUNTER — Telehealth: Payer: Self-pay | Admitting: Cardiology

## 2015-01-02 NOTE — Telephone Encounter (Signed)
Received records from [facility] for appointment with Dr Percival Spanish on 01/30/15.  Records given to [facility] (medical records) for Dr Hochrein's schedule on 01/30/15. lp

## 2015-01-03 DIAGNOSIS — N2581 Secondary hyperparathyroidism of renal origin: Secondary | ICD-10-CM | POA: Diagnosis not present

## 2015-01-03 DIAGNOSIS — D509 Iron deficiency anemia, unspecified: Secondary | ICD-10-CM | POA: Diagnosis not present

## 2015-01-03 DIAGNOSIS — D631 Anemia in chronic kidney disease: Secondary | ICD-10-CM | POA: Diagnosis not present

## 2015-01-03 DIAGNOSIS — N186 End stage renal disease: Secondary | ICD-10-CM | POA: Diagnosis not present

## 2015-01-05 DIAGNOSIS — N2581 Secondary hyperparathyroidism of renal origin: Secondary | ICD-10-CM | POA: Diagnosis not present

## 2015-01-05 DIAGNOSIS — D631 Anemia in chronic kidney disease: Secondary | ICD-10-CM | POA: Diagnosis not present

## 2015-01-05 DIAGNOSIS — D509 Iron deficiency anemia, unspecified: Secondary | ICD-10-CM | POA: Diagnosis not present

## 2015-01-05 DIAGNOSIS — N186 End stage renal disease: Secondary | ICD-10-CM | POA: Diagnosis not present

## 2015-01-07 ENCOUNTER — Encounter: Payer: Self-pay | Admitting: Internal Medicine

## 2015-01-08 DIAGNOSIS — D509 Iron deficiency anemia, unspecified: Secondary | ICD-10-CM | POA: Diagnosis not present

## 2015-01-08 DIAGNOSIS — D631 Anemia in chronic kidney disease: Secondary | ICD-10-CM | POA: Diagnosis not present

## 2015-01-08 DIAGNOSIS — N186 End stage renal disease: Secondary | ICD-10-CM | POA: Diagnosis not present

## 2015-01-08 DIAGNOSIS — N2581 Secondary hyperparathyroidism of renal origin: Secondary | ICD-10-CM | POA: Diagnosis not present

## 2015-01-10 DIAGNOSIS — N2581 Secondary hyperparathyroidism of renal origin: Secondary | ICD-10-CM | POA: Diagnosis not present

## 2015-01-10 DIAGNOSIS — D509 Iron deficiency anemia, unspecified: Secondary | ICD-10-CM | POA: Diagnosis not present

## 2015-01-10 DIAGNOSIS — N186 End stage renal disease: Secondary | ICD-10-CM | POA: Diagnosis not present

## 2015-01-10 DIAGNOSIS — D631 Anemia in chronic kidney disease: Secondary | ICD-10-CM | POA: Diagnosis not present

## 2015-01-12 DIAGNOSIS — N186 End stage renal disease: Secondary | ICD-10-CM | POA: Diagnosis not present

## 2015-01-12 DIAGNOSIS — D509 Iron deficiency anemia, unspecified: Secondary | ICD-10-CM | POA: Diagnosis not present

## 2015-01-12 DIAGNOSIS — D631 Anemia in chronic kidney disease: Secondary | ICD-10-CM | POA: Diagnosis not present

## 2015-01-12 DIAGNOSIS — N2581 Secondary hyperparathyroidism of renal origin: Secondary | ICD-10-CM | POA: Diagnosis not present

## 2015-01-13 DIAGNOSIS — Z992 Dependence on renal dialysis: Secondary | ICD-10-CM | POA: Diagnosis not present

## 2015-01-13 DIAGNOSIS — N186 End stage renal disease: Secondary | ICD-10-CM | POA: Diagnosis not present

## 2015-01-15 DIAGNOSIS — D509 Iron deficiency anemia, unspecified: Secondary | ICD-10-CM | POA: Diagnosis not present

## 2015-01-15 DIAGNOSIS — N2581 Secondary hyperparathyroidism of renal origin: Secondary | ICD-10-CM | POA: Diagnosis not present

## 2015-01-15 DIAGNOSIS — N186 End stage renal disease: Secondary | ICD-10-CM | POA: Diagnosis not present

## 2015-01-15 DIAGNOSIS — D631 Anemia in chronic kidney disease: Secondary | ICD-10-CM | POA: Diagnosis not present

## 2015-01-17 DIAGNOSIS — N186 End stage renal disease: Secondary | ICD-10-CM | POA: Diagnosis not present

## 2015-01-17 DIAGNOSIS — N2581 Secondary hyperparathyroidism of renal origin: Secondary | ICD-10-CM | POA: Diagnosis not present

## 2015-01-17 DIAGNOSIS — D509 Iron deficiency anemia, unspecified: Secondary | ICD-10-CM | POA: Diagnosis not present

## 2015-01-17 DIAGNOSIS — D631 Anemia in chronic kidney disease: Secondary | ICD-10-CM | POA: Diagnosis not present

## 2015-01-18 ENCOUNTER — Ambulatory Visit (INDEPENDENT_AMBULATORY_CARE_PROVIDER_SITE_OTHER): Payer: Medicare Other | Admitting: Physician Assistant

## 2015-01-18 ENCOUNTER — Encounter: Payer: Self-pay | Admitting: Physician Assistant

## 2015-01-18 VITALS — BP 134/80 | HR 88 | Resp 96 | Ht 61.0 in | Wt 124.8 lb

## 2015-01-18 DIAGNOSIS — Z86010 Personal history of colonic polyps: Secondary | ICD-10-CM

## 2015-01-18 MED ORDER — MOVIPREP 100 G PO SOLR: 1.0000 | Freq: Once | ORAL | Status: DC

## 2015-01-18 NOTE — Progress Notes (Signed)
Reviewed. Recall date  For 12/2014 is incorrect. Cecal  Tubular without dysplasia removed 12/2013. Recall letter from 01/2014 is 5 years. The date  12/2014 is an old recall date  From last colon by Dr Fuller Plan in 10/2010. She since then had a flex sig and full colonoscopy. Sorry.  Next colonoscopy ought to be 12/2018. Thanx

## 2015-01-18 NOTE — Patient Instructions (Signed)
You have been scheduled for a colonoscopy. Please follow written instructions given to you at your visit today.  Please pick up your prep kit at the pharmacy within the next 1-3 days. If you use inhalers (even only as needed), please bring them with you on the day of your procedure. Your physician has requested that you go to www.startemmi.com and enter the access code given to you at your visit today. This web site gives a general overview about your procedure. However, you should still follow specific instructions given to you by our office regarding your preparation for the procedure.  CC:Dr Polite

## 2015-01-18 NOTE — Progress Notes (Signed)
Patient ID: [name], female   DOB: 1951-01-24, 64 y.o.   MRN: [id number]     History of Present Illness:  [name] is a delightful 64 year old female with a history of hypertension, GERD, congestive heart failure, and end-stage renal dialysis who is on hemodialysis on Tuesdays, Thursdays, and Saturdays, she has a personal history of colon polyps. She had a colonoscopy in January 2015 at which time a sessile polyp ranging between 5-9 mm in size was found in the cecum and found to be adenomatous she has had multiple polyps prior to that. She was advised to have surveillance in 1 year and presents to do so. She has had no change in her bowel habits or stool caliber. She has no bloody or tarry stools. She has no anorexia or unexplained weight loss.   Past Medical History  Diagnosis Date  . Anxiety   . Blood transfusion   . Depression   . GERD (gastroesophageal reflux disease)   . Hyperlipidemia   . Hypertension   . Neuromuscular disorder     neuropathy hand and legs  . Weight loss, unintentional   . Adenomatous polyp of colon 10/2010  . Diverticula, colon   . Heart murmur   . CHF (congestive heart failure)   . 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  Had deceased donor kidney transplant in 1996.  Had some early rejection then stable function for years, then had slow decline of function and went back on hemodialysis in 2012.  Gets HD TTS schedule at [facility] on [facility] still using L forearm AVF.     Marland Kitchen Anemia in CKD (chronic kidney disease) 11/07/2012  . Arthritis   . Osteoporosis     Past Surgical History  Procedure Laterality Date  . Kidney transplant  1996  . Back surgery    . Cervical fusion    . Av fistula placement      for dialysis  . Cholecystectomy  12/02/2012    Procedure: LAPAROSCOPIC CHOLECYSTECTOMY WITH INTRAOPERATIVE CHOLANGIOGRAM;  Surgeon: Mariella Saa, MD;  Location: [facility]  OR;  Service: General;  Laterality: N/A;  . Spine surgery     Family History  Problem Relation Age of Onset  . Colon cancer Brother   . Cancer Brother   . Coronary artery disease Mother 35  . Heart disease Mother   . Hyperlipidemia Mother   . Hypertension Mother   . Esophageal cancer Neg Hx   . Stomach cancer Neg Hx   . Rectal cancer Neg Hx    History  Substance Use Topics  . Smoking status: Former Smoker    Types: Cigarettes    Quit date: 12/31/1991  . Smokeless tobacco: Never Used  . Alcohol Use: No   Current Outpatient Prescriptions  Medication Sig Dispense Refill  . allopurinol (ZYLOPRIM) 300 MG tablet Take 150 mg by mouth at bedtime.    Marland Kitchen aspirin EC 81 MG tablet Take 81 mg by mouth daily.    Marland Kitchen b complex-vitamin c-folic acid (NEPHRO-VITE) 0.8 MG TABS tablet Take 1 tablet by mouth at bedtime.    . calcium carbonate (TUMS - DOSED IN MG ELEMENTAL CALCIUM) 500 MG chewable tablet Chew 1 tablet by mouth 3 (three) times daily as needed. For acid    . cinacalcet (SENSIPAR) 30 MG tablet Take 30 mg by mouth daily.    . ciprofloxacin (CIPRO) 250 MG tablet Take 250 mg  by mouth 2 (two) times daily. One day left on course    . doxazosin (CARDURA) 8 MG tablet Take 1 tablet (8 mg total) by mouth 2 (two) times daily. 60 tablet 1  . HYDROcodone-acetaminophen (LORTAB) 10-500 MG per tablet Take 1 tablet by mouth every 6 (six) hours as needed for pain.    Marland Kitchen labetalol (NORMODYNE) 300 MG tablet Take 300 mg by mouth 2 (two) times daily.    Marland Kitchen losartan (COZAAR) 100 MG tablet Take 100 mg by mouth at bedtime.     . Multiple Vitamin (MULTIVITAMIN WITH MINERALS) TABS tablet Take 1 tablet by mouth daily.    . promethazine (PHENERGAN) 25 MG tablet Take 25 mg by mouth every 6 (six) hours as needed for nausea or vomiting.    . simvastatin (ZOCOR) 10 MG tablet Take 10 mg by mouth daily.    . sorbitol 70 % solution Take 60 mLs by mouth daily as needed.    . zolpidem (AMBIEN) 10 MG tablet Take 10 mg by mouth  at bedtime.    Marland Kitchen zolpidem (AMBIEN) 5 MG tablet Take 5 mg by mouth at bedtime.     Marland Kitchen MOVIPREP 100 G SOLR Take 1 kit (200 g total) by mouth once. 1 kit 0   No current facility-administered medications for this visit.   Allergies  Allergen Reactions  . Sulfa Antibiotics Other (See Comments)    Both parents allergic-so will not take      Review of Systems: Gen: Denies any fever, chills, sweats, anorexia, fatigue, weakness, malaise, weight loss, and sleep disorder CV: Denies chest pain, angina, palpitations, syncope, orthopnea, PND, peripheral edema, and claudication. Resp: Denies dyspnea at rest, dyspnea with exercise, cough, sputum, wheezing, coughing up blood, and pleurisy. GI: Denies vomiting blood, jaundice, and fecal incontinence.   Denies dysphagia or odynophagia. GU : Denies urinary burning, blood in urine, urinary frequency, urinary hesitancy, nocturnal urination, and urinary incontinence. MS: Denies joint pain, limitation of movement, and swelling, stiffness, low back pain, extremity pain. Denies muscle weakness, cramps, atrophy.  Derm: Denies rash, itching, dry skin, hives, moles, warts, or unhealing ulcers.  Psych: Denies depression, anxiety, memory loss, suicidal ideation, hallucinations, paranoia, and confusion. Heme: Denies bruising, bleeding, and enlarged lymph nodes. Neuro:  Denies any headaches, dizziness, paresthesia Endo:  Denies any problems with DM, thyroid, adrenal    Physical Exam: General: Pleasant, well developed female in no acute distress Head: Normocephalic and atraumatic Eyes:  sclerae anicteric, conjunctiva pink  Ears: Normal auditory acuity Lungs: Clear throughout to auscultation Heart: Regular rate and rhythm Abdomen: Soft, non distended, non-tender. No masses, no hepatomegaly. Normal bowel sounds Musculoskeletal: Symmetrical with no gross deformities  Extremities: No edema . Dialysis fistula left forearm Neurological: Alert oriented x 4, grossly  nonfocal Psychological:  Alert and cooperative. Normal mood and affect  Assessment and Recommendations: Asymptomatic 64 year old female with personal history of adenomatous polyps due for surveillance colonoscopy to evaluate for recurrent polyps or neoplasia. Patient is to be scheduled for a colonoscopy to evaluate for recurrent polyps. This will need to be scheduled on a Monday Wednesday or Friday as the patient has dialysis on Tuesday, Thursday, and Saturday.The risks, benefits, and alternatives to colonoscopy with possible biopsy and possible polypectomy were discussed with the patient and they consent to proceed. The procedure will be scheduled with Dr. Juanda Chance.        [name], Kathryn Pizza PA-C 01/18/2015,

## 2015-01-19 DIAGNOSIS — D631 Anemia in chronic kidney disease: Secondary | ICD-10-CM | POA: Diagnosis not present

## 2015-01-19 DIAGNOSIS — N186 End stage renal disease: Secondary | ICD-10-CM | POA: Diagnosis not present

## 2015-01-19 DIAGNOSIS — D509 Iron deficiency anemia, unspecified: Secondary | ICD-10-CM | POA: Diagnosis not present

## 2015-01-19 DIAGNOSIS — N2581 Secondary hyperparathyroidism of renal origin: Secondary | ICD-10-CM | POA: Diagnosis not present

## 2015-01-21 ENCOUNTER — Telehealth: Payer: Self-pay | Admitting: Physician Assistant

## 2015-01-21 ENCOUNTER — Telehealth: Payer: Self-pay | Admitting: *Deleted

## 2015-01-21 NOTE — Telephone Encounter (Signed)
-----   Message from Vita Barley Hvozdovic, PA-C sent at 01/21/2015  9:20 AM EST ----- Rollene Fare, can you lert this pt know Dr Olevia Perches reviewed her colonoscopies and says pt can wait 5 years. See Dr Diona Browner note and end of my office note. Can cancel her upcoming colonoscopy and please enter appropriate recall. Thank you ----- Message -----    From: Lafayette Dragon, MD    Sent: 01/18/2015  10:00 PM      To: Vita Barley Hvozdovic, PA-C    ----- Message -----    From: Vita Barley Hvozdovic, PA-C    Sent: 01/18/2015   3:33 PM      To: Lafayette Dragon, MD

## 2015-01-21 NOTE — Telephone Encounter (Signed)
Patient notified of recommendations. 

## 2015-01-21 NOTE — Telephone Encounter (Signed)
Lafayette Dragon, MD at 01/18/2015 9:57 PM     Status: Signed       Expand All Collapse All   Reviewed. Recall date For 12/2014 is incorrect. Cecal Tubular without dysplasia removed 12/2013. Recall letter from 01/2014 is 5 years. The date 12/2014 is an old recall date From last colon by Dr Fuller Plan in 10/2010. She since then had a flex sig and full colonoscopy. Sorry. Next colonoscopy ought to be 12/2018. Thanx     Left a message for patient to call back. Cancelled procedure. New recall date in computer.

## 2015-01-22 ENCOUNTER — Emergency Department (HOSPITAL_COMMUNITY): Payer: Medicare Other

## 2015-01-22 ENCOUNTER — Encounter (HOSPITAL_COMMUNITY): Payer: Self-pay | Admitting: Emergency Medicine

## 2015-01-22 ENCOUNTER — Emergency Department (HOSPITAL_COMMUNITY)
Admission: EM | Admit: 2015-01-22 | Discharge: 2015-01-22 | Disposition: A | Discharge status: Home / Self Care | Payer: Medicare Other | Attending: Emergency Medicine | Admitting: Emergency Medicine

## 2015-01-22 DIAGNOSIS — R011 Cardiac murmur, unspecified: Secondary | ICD-10-CM | POA: Insufficient documentation

## 2015-01-22 DIAGNOSIS — R110 Nausea: Secondary | ICD-10-CM | POA: Insufficient documentation

## 2015-01-22 DIAGNOSIS — R0789 Other chest pain: Secondary | ICD-10-CM | POA: Diagnosis not present

## 2015-01-22 DIAGNOSIS — R0989 Other specified symptoms and signs involving the circulatory and respiratory systems: Secondary | ICD-10-CM | POA: Diagnosis not present

## 2015-01-22 DIAGNOSIS — Z79899 Other long term (current) drug therapy: Secondary | ICD-10-CM | POA: Insufficient documentation

## 2015-01-22 DIAGNOSIS — E785 Hyperlipidemia, unspecified: Secondary | ICD-10-CM | POA: Insufficient documentation

## 2015-01-22 DIAGNOSIS — N186 End stage renal disease: Secondary | ICD-10-CM | POA: Diagnosis not present

## 2015-01-22 DIAGNOSIS — I120 Hypertensive chronic kidney disease with stage 5 chronic kidney disease or end stage renal disease: Secondary | ICD-10-CM | POA: Diagnosis not present

## 2015-01-22 DIAGNOSIS — D509 Iron deficiency anemia, unspecified: Secondary | ICD-10-CM | POA: Diagnosis not present

## 2015-01-22 DIAGNOSIS — Z940 Kidney transplant status: Secondary | ICD-10-CM | POA: Insufficient documentation

## 2015-01-22 DIAGNOSIS — N2581 Secondary hyperparathyroidism of renal origin: Secondary | ICD-10-CM | POA: Diagnosis not present

## 2015-01-22 DIAGNOSIS — F329 Major depressive disorder, single episode, unspecified: Secondary | ICD-10-CM | POA: Insufficient documentation

## 2015-01-22 DIAGNOSIS — M1990 Unspecified osteoarthritis, unspecified site: Secondary | ICD-10-CM | POA: Diagnosis not present

## 2015-01-22 DIAGNOSIS — I509 Heart failure, unspecified: Secondary | ICD-10-CM | POA: Diagnosis not present

## 2015-01-22 DIAGNOSIS — Z87891 Personal history of nicotine dependence: Secondary | ICD-10-CM | POA: Diagnosis not present

## 2015-01-22 DIAGNOSIS — F419 Anxiety disorder, unspecified: Secondary | ICD-10-CM | POA: Insufficient documentation

## 2015-01-22 DIAGNOSIS — Z992 Dependence on renal dialysis: Secondary | ICD-10-CM | POA: Diagnosis not present

## 2015-01-22 DIAGNOSIS — K219 Gastro-esophageal reflux disease without esophagitis: Secondary | ICD-10-CM | POA: Insufficient documentation

## 2015-01-22 DIAGNOSIS — Z86010 Personal history of colonic polyps: Secondary | ICD-10-CM | POA: Insufficient documentation

## 2015-01-22 DIAGNOSIS — M818 Other osteoporosis without current pathological fracture: Secondary | ICD-10-CM | POA: Diagnosis not present

## 2015-01-22 DIAGNOSIS — D631 Anemia in chronic kidney disease: Secondary | ICD-10-CM | POA: Diagnosis not present

## 2015-01-22 DIAGNOSIS — R079 Chest pain, unspecified: Secondary | ICD-10-CM | POA: Diagnosis not present

## 2015-01-22 DIAGNOSIS — Z7982 Long term (current) use of aspirin: Secondary | ICD-10-CM | POA: Diagnosis not present

## 2015-01-22 LAB — CBC
HCT: 36.7 % (ref 36.0–46.0)
Hemoglobin: 11.1 g/dL — ABNORMAL LOW (ref 12.0–15.0)
MCH: 28.9 pg (ref 26.0–34.0)
MCHC: 30.2 g/dL (ref 30.0–36.0)
MCV: 95.6 fL (ref 78.0–100.0)
Platelets: 279 10*3/uL (ref 150–400)
RBC: 3.84 MIL/uL — ABNORMAL LOW (ref 3.87–5.11)
RDW: 19.9 % — AB (ref 11.5–15.5)
WBC: 6.7 10*3/uL (ref 4.0–10.5)

## 2015-01-22 LAB — BASIC METABOLIC PANEL
Anion gap: 13 (ref 5–15)
BUN: 76 mg/dL — AB (ref 6–23)
CO2: 31 mmol/L (ref 19–32)
CREATININE: 9.09 mg/dL — AB (ref 0.50–1.10)
Calcium: 10.3 mg/dL (ref 8.4–10.5)
Chloride: 92 mmol/L — ABNORMAL LOW (ref 96–112)
GFR, EST AFRICAN AMERICAN: 5 mL/min — AB (ref >90)
GFR, EST NON AFRICAN AMERICAN: 4 mL/min — AB (ref >90)
GLUCOSE: 86 mg/dL (ref 70–99)
Potassium: 4.5 mmol/L (ref 3.5–5.1)
Sodium: 136 mmol/L (ref 135–145)

## 2015-01-22 LAB — I-STAT TROPONIN, ED: TROPONIN I, POC: 0.01 ng/mL (ref 0.00–0.08)

## 2015-01-22 LAB — BRAIN NATRIURETIC PEPTIDE: B Natriuretic Peptide: 1438.6 pg/mL — ABNORMAL HIGH (ref 0.0–100.0)

## 2015-01-22 MED ORDER — HYDROCODONE-ACETAMINOPHEN 5-325 MG PO TABS
1.0000 | ORAL_TABLET | Freq: Once | ORAL | Status: AC
  Administered 2015-01-22: 1 via ORAL
  Filled 2015-01-22: qty 1

## 2015-01-22 MED ORDER — HYDROCODONE-ACETAMINOPHEN 5-325 MG PO TABS: 1.0000 | ORAL_TABLET | ORAL | Status: DC | PRN

## 2015-01-22 NOTE — ED Provider Notes (Signed)
CSN: [id number]     Arrival date & time 01/22/15  0036 History   First MD Initiated Contact with Patient 01/22/15 0221     This chart was scribed for Delora Fuel, MD by Forrestine Him, ED Scribe. This patient was seen in room D35C/D35C and the patient's care was started 2:25 AM.   Chief Complaint  Patient presents with  . Chest Pain   The history is provided by the patient. No language interpreter was used.     HPI Comments: [name] is a 64 y.o. female with a PMHx of GERD, hyperlipemia, HTN CHF, ESRD, and osteoporosis who presents to the Emergency Department complaining of constant, moderate R sided chest pain with associated mild nausea x 1 day. Pain is described as achy/dull and currently rated 6/7. Pain is exacerbated when attempting to get up, changing positions, and when sitting down. Pt also reports "a funny feeling" to the L side of her face along with an ongoing mild productive cough. Ms. Hautala has tried OTC Tums without any improvement for discomfort.  No recent fever, chills, or diaphoresis. Pt is due for dialysis Tuesday morning 2/9. Pt with known allergy to Sulfa antibiotics.  Past Medical History  Diagnosis Date  . Anxiety   . Blood transfusion   . Depression   . GERD (gastroesophageal reflux disease)   . Hyperlipidemia   . Hypertension   . Neuromuscular disorder     neuropathy hand and legs  . Weight loss, unintentional   . Adenomatous polyp of colon 10/2010  . Diverticula, colon   . Heart murmur   . CHF (congestive heart failure)   . 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  Had deceased donor kidney transplant in 1996.  Had some early rejection then stable function for years, then had slow decline of function and went back on hemodialysis in 2012.  Gets HD TTS schedule at [facility] on [facility] still using L forearm AVF.     Marland Kitchen Anemia in CKD (chronic kidney disease) 11/07/2012  .  Arthritis   . Osteoporosis    Past Surgical History  Procedure Laterality Date  . Kidney transplant  1996  . Back surgery    . Cervical fusion    . Av fistula placement      for dialysis  . Cholecystectomy  12/02/2012    Procedure: LAPAROSCOPIC CHOLECYSTECTOMY WITH INTRAOPERATIVE CHOLANGIOGRAM;  Surgeon: Edward Jolly, MD;  Location: [facility] OR;  Service: General;  Laterality: N/A;  . Spine surgery     Family History  Problem Relation Age of Onset  . Colon cancer Brother   . Cancer Brother   . Coronary artery disease Mother 82  . Heart disease Mother   . Hyperlipidemia Mother   . Hypertension Mother   . Esophageal cancer Neg Hx   . Stomach cancer Neg Hx   . Rectal cancer Neg Hx    History  Substance Use Topics  . Smoking status: Former Smoker    Types: Cigarettes    Quit date: 12/31/1991  . Smokeless tobacco: Never Used  . Alcohol Use: No   OB History    No data available     Review of Systems  Constitutional: Negative for fever, chills and diaphoresis.  Cardiovascular: Positive for chest pain.  Gastrointestinal: Positive for nausea.      Allergies  Sulfa antibiotics  Home Medications   Prior to Admission  medications   Medication Sig Start Date End Date Taking? Authorizing Provider  allopurinol (ZYLOPRIM) 300 MG tablet Take 150 mg by mouth at bedtime.   Yes Historical Provider, MD  aspirin EC 81 MG tablet Take 81 mg by mouth daily.   Yes Historical Provider, MD  b complex-vitamin c-folic acid (NEPHRO-VITE) 0.8 MG TABS tablet Take 1 tablet by mouth at bedtime.   Yes Historical Provider, MD  calcium carbonate (TUMS - DOSED IN MG ELEMENTAL CALCIUM) 500 MG chewable tablet Chew 1 tablet by mouth 3 (three) times daily as needed. For acid   Yes Historical Provider, MD  cinacalcet (SENSIPAR) 30 MG tablet Take 30 mg by mouth daily.   Yes Historical Provider, MD  doxazosin (CARDURA) 8 MG tablet Take 1 tablet (8 mg total) by mouth 2 (two) times daily. Patient taking  differently: Take 8 mg by mouth 2 (two) times daily as needed (hypertension).  11/14/14  Yes Deanna M Didiano, DO  HYDROcodone-acetaminophen (LORTAB) 10-500 MG per tablet Take 1 tablet by mouth every 6 (six) hours as needed for pain.   Yes Historical Provider, MD  labetalol (NORMODYNE) 300 MG tablet Take 300 mg by mouth 2 (two) times daily. 11/09/12  Yes Velvet Bathe, MD  losartan (COZAAR) 100 MG tablet Take 100 mg by mouth at bedtime.    Yes Historical Provider, MD  Multiple Vitamin (MULTIVITAMIN WITH MINERALS) TABS tablet Take 1 tablet by mouth daily.   Yes Historical Provider, MD  promethazine (PHENERGAN) 25 MG tablet Take 25 mg by mouth every 6 (six) hours as needed for nausea or vomiting.   Yes Historical Provider, MD  simvastatin (ZOCOR) 10 MG tablet Take 10 mg by mouth daily.   Yes Historical Provider, MD  sorbitol 70 % solution Take 60 mLs by mouth daily as needed (constipation).    Yes Historical Provider, MD  zolpidem (AMBIEN) 10 MG tablet Take 10 mg by mouth at bedtime.   Yes Historical Provider, MD  ciprofloxacin (CIPRO) 250 MG tablet Take 250 mg by mouth 2 (two) times daily. One day left on course    Historical Provider, MD  MOVIPREP 100 G SOLR Take 1 kit (200 g total) by mouth once. Patient not taking: Reported on 01/22/2015 01/18/15   Cecille Rubin P Hvozdovic, PA-C  zolpidem (AMBIEN) 5 MG tablet Take 5 mg by mouth at bedtime.     Historical Provider, MD   Triage Vitals: BP 178/97 mmHg  Pulse 88  Resp 18  SpO2 100%   Physical Exam  Constitutional: She is oriented to person, place, and time. She appears well-developed and well-nourished. No distress.  HENT:  Head: Normocephalic and atraumatic.  Eyes: EOM are normal. Pupils are equal, round, and reactive to light.  Neck: Normal range of motion.  Cardiovascular: Normal rate and regular rhythm.   Murmur heard. 2/6 holosystolic murmur heard best at the lower left sternal border  Pulmonary/Chest: Effort normal and breath sounds normal.   Abdominal: Soft. Bowel sounds are normal. She exhibits no distension and no mass. There is no tenderness. There is no guarding.  Musculoskeletal: Normal range of motion.  1+ pedal edema. AV fistula present in the right forearm with thrill present.  Neurological: She is alert and oriented to person, place, and time. No cranial nerve deficit. Coordination normal.  Skin: Skin is warm and dry. No rash noted.  Psychiatric: She has a normal mood and affect. Her behavior is normal. Judgment and thought content normal.  Nursing note and vitals reviewed.  ED Course  Procedures (including critical care time)  DIAGNOSTIC STUDIES: Oxygen Saturation is 100% on RA, Normal by my interpretation.    COORDINATION OF CARE: 2:34 AM-Discussed treatment plan with pt at bedside and pt agreed to plan.     Labs Review Results for orders placed or performed during the hospital encounter of 01/22/15  CBC  Result Value Ref Range   WBC 6.7 4.0 - 10.5 K/uL   RBC 3.84 (L) 3.87 - 5.11 MIL/uL   Hemoglobin 11.1 (L) 12.0 - 15.0 g/dL   HCT 36.7 36.0 - 46.0 %   MCV 95.6 78.0 - 100.0 fL   MCH 28.9 26.0 - 34.0 pg   MCHC 30.2 30.0 - 36.0 g/dL   RDW 19.9 (H) 11.5 - 15.5 %   Platelets 279 150 - 400 K/uL  Basic metabolic panel  Result Value Ref Range   Sodium 136 135 - 145 mmol/L   Potassium 4.5 3.5 - 5.1 mmol/L   Chloride 92 (L) 96 - 112 mmol/L   CO2 31 19 - 32 mmol/L   Glucose, Bld 86 70 - 99 mg/dL   BUN 76 (H) 6 - 23 mg/dL   Creatinine, Ser 9.09 (H) 0.50 - 1.10 mg/dL   Calcium 10.3 8.4 - 10.5 mg/dL   GFR calc non Af Amer 4 (L) >90 mL/min   GFR calc Af Amer 5 (L) >90 mL/min   Anion gap 13 5 - 15  I-stat troponin, ED (not at [facility])  Result Value Ref Range   Troponin i, poc 0.01 0.00 - 0.08 ng/mL   Comment 3            Imaging Review Dg Chest 2 View  01/22/2015   CLINICAL DATA:  Acute onset of generalized chest pain. Initial encounter.  EXAM: CHEST  2 VIEW  COMPARISON:  Chest radiograph and CTA of the chest  performed 07/31/2014  FINDINGS: The lungs are well-aerated. Vascular congestion is noted. Mildly increased interstitial markings raise concern for mild interstitial edema. Mild chronic peripheral interstitial opacities are seen. There is no evidence of pleural effusion or pneumothorax.  The heart is borderline enlarged. No acute osseous abnormalities are seen. A left-sided nipple shadow is noted. Scattered clips are seen about the upper abdomen.  IMPRESSION: Vascular congestion and borderline cardiomegaly. Mildly increased interstitial markings raise concern for mild interstitial edema.   Electronically Signed   By: Garald Balding M.D.   On: 01/22/2015 01:48     EKG Interpretation   Date/Time:  Tuesday January 22 2015 00:40:55 EST Ventricular Rate:  71 PR Interval:  180 QRS Duration: 78 QT Interval:  428 QTC Calculation: 465 R Axis:   74 Text Interpretation:  Normal sinus rhythm Possible Anterior infarct , age  undetermined Abnormal ECG When compared with ECG of 07/31/2014, QT has  shortened Confirmed by [facility]  MD, Annetta Deiss ([id number]) on 01/22/2015 1:41:32 AM      MDM   Final diagnoses:  Chest wall pain  End-stage renal disease on hemodialysis    Chest pain which appears to be chest wall pain. Old records reviewed in she had a visit for similar complaint last May but symptoms were left-sided. Patient states that pain today is similar to pain then. Workup here is unremarkable including unchanged ECG and unremarkable chest x-ray. She is discharged with prescription for hydrocodone-acetaminophen and is to follow-up with PCP.  I personally performed the services described in this documentation, which was scribed in my presence. The recorded information has been reviewed and  is accurate.     Delora Fuel, MD [id number]

## 2015-01-22 NOTE — Telephone Encounter (Signed)
Patient notified and [name] will mail her a check.

## 2015-01-22 NOTE — ED Notes (Signed)
Pt. report right lower chest pain with mild SOB onset this evening , denies emesis or diarrhea, no cough or chest congestion  . Hemodialysis q Tues/Thurs/Sat.

## 2015-01-22 NOTE — Discharge Instructions (Signed)
Chest Wall Pain Chest wall pain is pain in or around the bones and muscles of your chest. It may take up to 6 weeks to get better. It may take longer if you must stay physically active in your work and activities.  CAUSES  Chest wall pain may happen on its own. However, it may be caused by:  A viral illness like the flu.  Injury.  Coughing.  Exercise.  Arthritis.  Fibromyalgia.  Shingles. HOME CARE INSTRUCTIONS   Avoid overtiring physical activity. Try not to strain or perform activities that cause pain. This includes any activities using your chest or your abdominal and side muscles, especially if heavy weights are used.  Put ice on the sore area.  Put ice in a plastic bag.  Place a towel between your skin and the bag.  Leave the ice on for 15-20 minutes per hour while awake for the first 2 days.  Only take over-the-counter or prescription medicines for pain, discomfort, or fever as directed by your caregiver. SEEK IMMEDIATE MEDICAL CARE IF:   Your pain increases, or you are very uncomfortable.  You have a fever.  Your chest pain becomes worse.  You have new, unexplained symptoms.  You have nausea or vomiting.  You feel sweaty or lightheaded.  You have a cough with phlegm (sputum), or you cough up blood. MAKE SURE YOU:   Understand these instructions.  Will watch your condition.  Will get help right away if you are not doing well or get worse. Document Released: 11/30/2005 Document Revised: 02/22/2012 Document Reviewed: 07/27/2011 [facility] Patient Information 2015 [place], [place]. This information is not intended to replace advice given to you by your health care provider. Make sure you discuss any questions you have with your health care provider.  Acetaminophen; Hydrocodone tablets or capsules What is this medicine? ACETAMINOPHEN; HYDROCODONE (a set a MEE noe fen; hye droe KOE done) is a pain reliever. It is used to treat mild to moderate pain. This  medicine may be used for other purposes; ask your health care provider or pharmacist if you have questions. COMMON BRAND NAME(S): Anexsia, Bancap HC, Ceta-Plus, Co-Gesic, Comfortpak, Dolagesic, [employer], DuoCet, Hydrocet, Hydrogesic, Cooper, Lorcet HD, Lorcet Plus, Lortab, Margesic H, Maxidone, [place], Polygesic, [place], [place], [place], Vicodin, Vicodin ES, Vicodin HP, Charlane Ferretti What should I tell my health care provider before I take this medicine? They need to know if you have any of these conditions: -brain tumor -Crohn's disease, inflammatory bowel disease, or ulcerative colitis -drug abuse or addiction -head injury -heart or circulation problems -if you often drink alcohol -kidney disease or problems going to the bathroom -liver disease -lung disease, asthma, or breathing problems -an unusual or allergic reaction to acetaminophen, hydrocodone, other opioid analgesics, other medicines, foods, dyes, or preservatives -pregnant or trying to get pregnant -breast-feeding How should I use this medicine? Take this medicine by mouth. Swallow it with a full glass of water. Follow the directions on the prescription label. If the medicine upsets your stomach, take the medicine with food or milk. Do not take more than you are told to take. Talk to your pediatrician regarding the use of this medicine in children. This medicine is not approved for use in children. Overdosage: If you think you have taken too much of this medicine contact a poison control center or emergency room at once. NOTE: This medicine is only for you. Do not share this medicine with others. What if I miss a dose? If you miss a  dose, take it as soon as you can. If it is almost time for your next dose, take only that dose. Do not take double or extra doses. What may interact with this medicine? -alcohol -antihistamines -isoniazid -medicines for depression, anxiety, or psychotic disturbances -medicines for  sleep -muscle relaxants -naltrexone -narcotic medicines (opiates) for pain -phenobarbital -ritonavir -tramadol This list may not describe all possible interactions. Give your health care provider a list of all the medicines, herbs, non-prescription drugs, or dietary supplements you use. Also tell them if you smoke, drink alcohol, or use illegal drugs. Some items may interact with your medicine. What should I watch for while using this medicine? Tell your doctor or health care professional if your pain does not go away, if it gets worse, or if you have new or a different type of pain. You may develop tolerance to the medicine. Tolerance means that you will need a higher dose of the medicine for pain relief. Tolerance is normal and is expected if you take the medicine for a long time. Do not suddenly stop taking your medicine because you may develop a severe reaction. Your body becomes used to the medicine. This does NOT mean you are addicted. Addiction is a behavior related to getting and using a drug for a non-medical reason. If you have pain, you have a medical reason to take pain medicine. Your doctor will tell you how much medicine to take. If your doctor wants you to stop the medicine, the dose will be slowly lowered over time to avoid any side effects. You may get drowsy or dizzy when you first start taking the medicine or change doses. Do not drive, use machinery, or do anything that may be dangerous until you know how the medicine affects you. Stand or sit up slowly. There are different types of narcotic medicines (opiates) for pain. If you take more than one type at the same time, you may have more side effects. Give your health care provider a list of all medicines you use. Your doctor will tell you how much medicine to take. Do not take more medicine than directed. Call emergency for help if you have problems breathing. The medicine will cause constipation. Try to have a bowel movement at  least every 2 to 3 days. If you do not have a bowel movement for 3 days, call your doctor or health care professional. Too much acetaminophen can be very dangerous. Do not take Tylenol (acetaminophen) or medicines that contain acetaminophen with this medicine. Many non-prescription medicines contain acetaminophen. Always read the labels carefully. What side effects may I notice from receiving this medicine? Side effects that you should report to your doctor or health care professional as soon as possible: -allergic reactions like skin rash, itching or hives, swelling of the face, lips, or tongue -breathing problems -confusion -feeling faint or lightheaded, falls -stomach pain -yellowing of the eyes or skin Side effects that usually do not require medical attention (report to your doctor or health care professional if they continue or are bothersome): -nausea, vomiting -stomach upset This list may not describe all possible side effects. Call your doctor for medical advice about side effects. You may report side effects to FDA at 1-800-FDA-1088. Where should I keep my medicine? Keep out of the reach of children. This medicine can be abused. Keep your medicine in a safe place to protect it from theft. Do not share this medicine with anyone. Selling or giving away this medicine is dangerous and against  the law. Store at room temperature between 15 and 30 degrees C (59 and 86 degrees F). Protect from light. Keep container tightly closed. Throw away any unused medicine after the expiration date. Discard unused medicine and used packaging carefully. Pets and children can be harmed if they find used or lost packages. NOTE: This sheet is a summary. It may not cover all possible information. If you have questions about this medicine, talk to your doctor, pharmacist, or health care provider.  2015, Elsevier/Gold Standard. (2013-07-24 13:15:56)

## 2015-01-22 NOTE — ED Notes (Signed)
Patient transported to X-ray 

## 2015-01-22 NOTE — Telephone Encounter (Signed)
Per Anastasia Pall, please reimburse patient for $39.97.

## 2015-01-23 DIAGNOSIS — N760 Acute vaginitis: Secondary | ICD-10-CM | POA: Diagnosis not present

## 2015-01-23 DIAGNOSIS — N950 Postmenopausal bleeding: Secondary | ICD-10-CM | POA: Diagnosis not present

## 2015-01-24 DIAGNOSIS — N2581 Secondary hyperparathyroidism of renal origin: Secondary | ICD-10-CM | POA: Diagnosis not present

## 2015-01-24 DIAGNOSIS — N186 End stage renal disease: Secondary | ICD-10-CM | POA: Diagnosis not present

## 2015-01-24 DIAGNOSIS — D509 Iron deficiency anemia, unspecified: Secondary | ICD-10-CM | POA: Diagnosis not present

## 2015-01-24 DIAGNOSIS — D631 Anemia in chronic kidney disease: Secondary | ICD-10-CM | POA: Diagnosis not present

## 2015-01-25 DIAGNOSIS — Z1231 Encounter for screening mammogram for malignant neoplasm of breast: Secondary | ICD-10-CM | POA: Diagnosis not present

## 2015-01-26 DIAGNOSIS — D509 Iron deficiency anemia, unspecified: Secondary | ICD-10-CM | POA: Diagnosis not present

## 2015-01-26 DIAGNOSIS — D631 Anemia in chronic kidney disease: Secondary | ICD-10-CM | POA: Diagnosis not present

## 2015-01-26 DIAGNOSIS — N186 End stage renal disease: Secondary | ICD-10-CM | POA: Diagnosis not present

## 2015-01-26 DIAGNOSIS — N2581 Secondary hyperparathyroidism of renal origin: Secondary | ICD-10-CM | POA: Diagnosis not present

## 2015-01-29 DIAGNOSIS — D509 Iron deficiency anemia, unspecified: Secondary | ICD-10-CM | POA: Diagnosis not present

## 2015-01-29 DIAGNOSIS — N2581 Secondary hyperparathyroidism of renal origin: Secondary | ICD-10-CM | POA: Diagnosis not present

## 2015-01-29 DIAGNOSIS — N186 End stage renal disease: Secondary | ICD-10-CM | POA: Diagnosis not present

## 2015-01-29 DIAGNOSIS — D631 Anemia in chronic kidney disease: Secondary | ICD-10-CM | POA: Diagnosis not present

## 2015-01-30 ENCOUNTER — Encounter: Payer: Self-pay | Admitting: Cardiology

## 2015-01-30 ENCOUNTER — Ambulatory Visit (INDEPENDENT_AMBULATORY_CARE_PROVIDER_SITE_OTHER): Payer: Medicare Other | Admitting: Cardiology

## 2015-01-30 VITALS — BP 168/100 | HR 69 | Ht 61.0 in | Wt 127.0 lb

## 2015-01-30 DIAGNOSIS — R0789 Other chest pain: Secondary | ICD-10-CM

## 2015-01-30 NOTE — Progress Notes (Signed)
Cardiology Office Note   Date:  01/30/2015   ID:  SHARITY FERRANTE, DOB 1951/04/21, MRN [id number]  PCP:  Kandice Hams, MD  Cardiologist:   Minus Breeding, MD   No chief complaint on file.     History of Present Illness: [name] is a 64 y.o. female who presents for evaluation of cognitive difficulties. The patient has end-stage renal disease. She is being considered for transplant. I reviewed an out side extensive neurocognitive workup. There was no clear etiology is some memory problems that she's been having. However, it was suggested that she see a cardiologist to discuss exercise as this could possibly improve cognition. I have seen the patient greater than 3 years ago. She had some dyspnea. There was a questionable abnormality with cardiomegaly and possible pericardial effusion from a radiologic study. However, she had a normal ejection fraction with some evidence of diastolic dysfunction and no pericardial effusion on echo. She had a stress perfusion study in January 2013 for evaluation of dyspnea and there was no evidence of ischemia or infarct. I reviewed extensive hospital records and she was last hospitalized in 2014 for volume overload with shortness of breath this was treated with hemodialysis. Echo again demonstrated a well preserved ejection fraction.  Most recently she was in the emergency room earlier this month with chest pain was felt to be musculoskeletal. Troponin was negative. BNP was elevated however.  I was able to review extensive records including an abdominal CT and ultrasound from [facility]. She had an MRI. She's being evaluated for another kidney transplant. She did have atherosclerotic disease noted both peripherally and in her aorta. She does her activities of daily living. She gets tired. She might get chest discomfort with activities such as shoveling snow or hurrying to do something.  She does not get any resting complaints. She might get some dyspnea  with exertion but she has no BNP or orthopnea. She doesn't notice palpitations, presyncope or syncope.    Past Medical History  Diagnosis Date  . Anxiety   . Blood transfusion   . Depression   . GERD (gastroesophageal reflux disease)   . Hyperlipidemia   . Hypertension   . Neuromuscular disorder     neuropathy hand and legs  . Weight loss, unintentional   . Adenomatous polyp of colon 10/2010  . Diverticula, colon   . Heart murmur   . CHF (congestive heart failure)   . 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  Had deceased donor kidney transplant in 1996.  Had some early rejection then stable function for years, then had slow decline of function and went back on hemodialysis in 2012.  Gets HD TTS schedule at [facility] still using L forearm AVF.     Marland Kitchen Anemia in CKD (chronic kidney disease) 11/07/2012  . Arthritis   . Osteoporosis     Past Surgical History  Procedure Laterality Date  . Kidney transplant  1996  . Back surgery    . Cervical fusion    . Av fistula placement      for dialysis  . Cholecystectomy  12/02/2012    Procedure: LAPAROSCOPIC CHOLECYSTECTOMY WITH INTRAOPERATIVE CHOLANGIOGRAM;  Surgeon: Edward Jolly, MD;  Location: [place];  Service: General;  Laterality: N/A;  . Spine surgery       Current Outpatient Prescriptions  Medication Sig Dispense Refill  . allopurinol (ZYLOPRIM)  300 MG tablet Take 150 mg by mouth at bedtime.    Marland Kitchen aspirin EC 81 MG tablet Take 81 mg by mouth daily.    Marland Kitchen b complex-vitamin c-folic acid (NEPHRO-VITE) 0.8 MG TABS tablet Take 1 tablet by mouth at bedtime.    . calcium carbonate (TUMS - DOSED IN MG ELEMENTAL CALCIUM) 500 MG chewable tablet Chew 1 tablet by mouth 3 (three) times daily as needed. For acid    . cinacalcet (SENSIPAR) 30 MG tablet Take 30 mg by mouth daily.    Marland Kitchen doxazosin (CARDURA) 8 MG tablet Take 1 tablet (8 mg total) by mouth  2 (two) times daily. (Patient taking differently: Take 8 mg by mouth 2 (two) times daily as needed (hypertension). ) 60 tablet 1  . HYDROcodone-acetaminophen (NORCO) 5-325 MG per tablet Take 1 tablet by mouth every 4 (four) hours as needed for moderate pain. 20 tablet 0  . labetalol (NORMODYNE) 300 MG tablet Take 300 mg by mouth 2 (two) times daily.    Marland Kitchen losartan (COZAAR) 100 MG tablet Take 100 mg by mouth at bedtime.     . Multiple Vitamin (MULTIVITAMIN WITH MINERALS) TABS tablet Take 1 tablet by mouth daily.    . promethazine (PHENERGAN) 25 MG tablet Take 25 mg by mouth every 6 (six) hours as needed for nausea or vomiting.    . simvastatin (ZOCOR) 10 MG tablet Take 10 mg by mouth daily.    . sorbitol 70 % solution Take 60 mLs by mouth daily as needed (constipation).     Marland Kitchen zolpidem (AMBIEN) 10 MG tablet Take 10 mg by mouth at bedtime.    Marland Kitchen zolpidem (AMBIEN) 5 MG tablet Take 5 mg by mouth at bedtime.      No current facility-administered medications for this visit.    Allergies:   Sulfa antibiotics    Social History:  The patient  reports that she quit smoking about 23 years ago. Her smoking use included Cigarettes. She has never used smokeless tobacco. She reports that she does not drink alcohol or use illicit drugs.   Family History:  The patient's family history includes Cancer in her brother; Colon cancer in her brother; Coronary artery disease (age of onset: 45) in her mother; Heart disease in her mother; Hyperlipidemia in her mother; Hypertension in her mother. There is no history of Esophageal cancer, Stomach cancer, or Rectal cancer.    ROS:  Please see the history of present illness.   Otherwise, review of systems are positive for headaches, dizziness, difficulty swallowing, leg pain.   All other systems are reviewed and negative.    PHYSICAL EXAM: VS:  BP 168/100 mmHg  Pulse 69  Ht 5\' 1"  (1.549 m)  Wt 127 lb (57.607 kg)  BMI 24.01 kg/m2 , BMI Body mass index is 24.01  kg/(m^2). GEN: Well nourished, well developed, in no acute distress HEENT: normal Neck: no JVD, carotid bruits, or masses Cardiac: RRR; no murmurs, rubs, or gallops,no edema  Respiratory:  clear to auscultation bilaterally, normal work of breathing GI: soft, nontender, nondistended, + BS MS: no deformity or atrophy Skin: warm and dry, no rash Neuro:  Strength and sensation are intact Psych: euthymic mood, full affect Ext:  Left arm dialysis graft with thrill and bruit   EKG:  EKG is ordered today. The ekg ordered today demonstrates sinus rhythm, rate 69, axis within normal limits, intervals within normal limits, early repolarization pattern, poor anterior R wave progression   Recent Labs: 07/31/2014: ALT 7;  Pro B Natriuretic peptide (BNP) >70000.0* 01/22/2015: B Natriuretic Peptide 1438.6*; BUN 76*; Creatinine 9.09*; Hemoglobin 11.1*; Platelets 279; Potassium 4.5; Sodium 136    Lipid Panel No results found for: CHOL, TRIG, HDL, CHOLHDL, VLDL, LDLCALC, LDLDIRECT    Wt Readings from Last 3 Encounters:  01/30/15 127 lb (57.607 kg)  01/18/15 124 lb 12.8 oz (56.609 kg)  11/14/14 124 lb (56.246 kg)      Other studies Reviewed: Additional studies/ records that were reviewed today include: See above. Extensive radiologic studies reviewed from [facility]. Outpatient notes from neuropsychology. Recent emergency room records. Review of the above records demonstrates: As recorded elsewhere   ASSESSMENT AND PLAN:  CHEST PAIN:  This is somewhat atypical. However, she does have atherosclerotic disease noted with calcification reported.  Therefore, prior to any renal transplant she needs screening with a stress test. She said she would be a little walk on a treadmill with fatigue and dyspnea. Therefore, she will have a Lexiscan Myoview.'  HTN:  She says this blood pressure is unusual. Typically runs normal or even dips low. Therefore, I cannot titrate her medications.  HF WITH  PRESERVED EF:  The patient seems to be euvolemic now. Her volume is managed with dialysis.   Current medicines are reviewed at length with the patient today.  The patient does not have concerns regarding medicines.  The following changes have been made:  no change  Labs/ tests ordered today include: Orders Placed This Encounter  Procedures  . EKG 12-Lead     Disposition:   FU with me as needed.  Signed, Minus Breeding, MD  01/30/2015 11:51 AM    [place]

## 2015-01-30 NOTE — Patient Instructions (Signed)
Your physician recommends that you schedule a follow-up appointment in: as needed with Dr. Percival Spanish  We are ordering a stress test for you to get done

## 2015-01-31 DIAGNOSIS — D509 Iron deficiency anemia, unspecified: Secondary | ICD-10-CM | POA: Diagnosis not present

## 2015-01-31 DIAGNOSIS — N2581 Secondary hyperparathyroidism of renal origin: Secondary | ICD-10-CM | POA: Diagnosis not present

## 2015-01-31 DIAGNOSIS — N186 End stage renal disease: Secondary | ICD-10-CM | POA: Diagnosis not present

## 2015-01-31 DIAGNOSIS — D631 Anemia in chronic kidney disease: Secondary | ICD-10-CM | POA: Diagnosis not present

## 2015-02-02 DIAGNOSIS — D509 Iron deficiency anemia, unspecified: Secondary | ICD-10-CM | POA: Diagnosis not present

## 2015-02-02 DIAGNOSIS — D631 Anemia in chronic kidney disease: Secondary | ICD-10-CM | POA: Diagnosis not present

## 2015-02-02 DIAGNOSIS — N2581 Secondary hyperparathyroidism of renal origin: Secondary | ICD-10-CM | POA: Diagnosis not present

## 2015-02-02 DIAGNOSIS — N186 End stage renal disease: Secondary | ICD-10-CM | POA: Diagnosis not present

## 2015-02-04 DIAGNOSIS — Z961 Presence of intraocular lens: Secondary | ICD-10-CM | POA: Diagnosis not present

## 2015-02-05 DIAGNOSIS — N186 End stage renal disease: Secondary | ICD-10-CM | POA: Diagnosis not present

## 2015-02-05 DIAGNOSIS — N2581 Secondary hyperparathyroidism of renal origin: Secondary | ICD-10-CM | POA: Diagnosis not present

## 2015-02-05 DIAGNOSIS — D509 Iron deficiency anemia, unspecified: Secondary | ICD-10-CM | POA: Diagnosis not present

## 2015-02-05 DIAGNOSIS — D631 Anemia in chronic kidney disease: Secondary | ICD-10-CM | POA: Diagnosis not present

## 2015-02-06 DIAGNOSIS — N950 Postmenopausal bleeding: Secondary | ICD-10-CM | POA: Diagnosis not present

## 2015-02-07 DIAGNOSIS — D631 Anemia in chronic kidney disease: Secondary | ICD-10-CM | POA: Diagnosis not present

## 2015-02-07 DIAGNOSIS — D509 Iron deficiency anemia, unspecified: Secondary | ICD-10-CM | POA: Diagnosis not present

## 2015-02-07 DIAGNOSIS — N186 End stage renal disease: Secondary | ICD-10-CM | POA: Diagnosis not present

## 2015-02-07 DIAGNOSIS — N2581 Secondary hyperparathyroidism of renal origin: Secondary | ICD-10-CM | POA: Diagnosis not present

## 2015-02-08 ENCOUNTER — Telehealth (HOSPITAL_COMMUNITY): Payer: Self-pay

## 2015-02-08 ENCOUNTER — Encounter: Payer: Medicare Other | Admitting: Internal Medicine

## 2015-02-08 NOTE — Telephone Encounter (Signed)
Encounter complete. 

## 2015-02-09 DIAGNOSIS — D631 Anemia in chronic kidney disease: Secondary | ICD-10-CM | POA: Diagnosis not present

## 2015-02-09 DIAGNOSIS — N186 End stage renal disease: Secondary | ICD-10-CM | POA: Diagnosis not present

## 2015-02-09 DIAGNOSIS — N2581 Secondary hyperparathyroidism of renal origin: Secondary | ICD-10-CM | POA: Diagnosis not present

## 2015-02-09 DIAGNOSIS — D509 Iron deficiency anemia, unspecified: Secondary | ICD-10-CM | POA: Diagnosis not present

## 2015-02-11 DIAGNOSIS — Z992 Dependence on renal dialysis: Secondary | ICD-10-CM | POA: Diagnosis not present

## 2015-02-11 DIAGNOSIS — N186 End stage renal disease: Secondary | ICD-10-CM | POA: Diagnosis not present

## 2015-02-12 DIAGNOSIS — D509 Iron deficiency anemia, unspecified: Secondary | ICD-10-CM | POA: Diagnosis not present

## 2015-02-12 DIAGNOSIS — D631 Anemia in chronic kidney disease: Secondary | ICD-10-CM | POA: Diagnosis not present

## 2015-02-12 DIAGNOSIS — N186 End stage renal disease: Secondary | ICD-10-CM | POA: Diagnosis not present

## 2015-02-12 DIAGNOSIS — N2581 Secondary hyperparathyroidism of renal origin: Secondary | ICD-10-CM | POA: Diagnosis not present

## 2015-02-13 ENCOUNTER — Ambulatory Visit (HOSPITAL_COMMUNITY)
Admission: RE | Admit: 2015-02-13 | Discharge: 2015-02-13 | Disposition: A | Discharge status: Home / Self Care | Payer: Medicare Other | Source: Ambulatory Visit | Attending: Cardiology | Admitting: Cardiology

## 2015-02-13 DIAGNOSIS — R0789 Other chest pain: Secondary | ICD-10-CM | POA: Diagnosis not present

## 2015-02-13 MED ORDER — TECHNETIUM TC 99M SESTAMIBI GENERIC - CARDIOLITE
32.0000 | Freq: Once | INTRAVENOUS | Status: AC | PRN
Start: 2015-02-13 — End: 2015-02-13
  Administered 2015-02-13: 32 via INTRAVENOUS

## 2015-02-13 MED ORDER — AMINOPHYLLINE 25 MG/ML IV SOLN
75.0000 mg | Freq: Once | INTRAVENOUS | Status: AC
  Administered 2015-02-13: 75 mg via INTRAVENOUS

## 2015-02-13 MED ORDER — REGADENOSON 0.4 MG/5ML IV SOLN
0.4000 mg | Freq: Once | INTRAVENOUS | Status: AC
  Administered 2015-02-13: 0.4 mg via INTRAVENOUS

## 2015-02-13 MED ORDER — TECHNETIUM TC 99M SESTAMIBI GENERIC - CARDIOLITE
10.5000 | Freq: Once | INTRAVENOUS | Status: AC | PRN
  Administered 2015-02-13: 11 via INTRAVENOUS

## 2015-02-13 NOTE — Procedures (Addendum)
[name] [address] [place] [id number]  Cardiology Nuclear Med Study  [name] is a 64 y.o. female     MRN : [id number]     DOB: [birth year]  Procedure Date: 02/13/2015  Nuclear Med Background Indication for Stress Test:  Evaluation for Ischemia and Post Hospital History:  CHF;No prior respiratory history reported;Last NUC MPI on 01/11/2012-normal;EF=76%;ESRD Cardiac Risk Factors: Family History - CAD, History of Smoking, Hypertension and TIA  Symptoms:  Chest Pain, Dizziness, DOE, Fatigue, Light-Headedness, Nausea, Palpitations and SOB   Nuclear Pre-Procedure Caffeine/Decaff Intake:  7:00pm NPO After: 5:00am   IV Site: R Forearm  IV 0.9% NS with Angio Cath:  22g  Chest Size (in):  N/A IV Started by: Kathryn Course, RN  Height: 5\' 1"  (1.549 m)  Cup Size: B  BMI:  Body mass index is 24.01 kg/(m^2). Weight:  127 lb (57.607 kg)   Tech Comments:  n/a    Nuclear Med Study 1 or 2 day study: 1 day  Stress Test Type:  [place] Provider:  Minus Breeding, MD   Resting Radionuclide: Technetium 103m Sestamibi  Resting Radionuclide Dose: 10.5 mCi   Stress Radionuclide:  Technetium 63m Sestamibi  Stress Radionuclide Dose: 32.0 mCi           Stress Protocol Rest HR: 67 Stress HR: 70  Rest BP: 188/101 Stress BP: 151/79  Exercise Time (min): n/a METS: n/a          Dose of Adenosine (mg):  n/a Dose of Lexiscan: 0.4 mg  Dose of Atropine (mg): n/a Dose of Dobutamine: n/a mcg/kg/min (at max HR)  Stress Test Technologist: [name], CCT Nuclear Technologist: [name], CNMT   Rest Procedure:  Myocardial perfusion imaging was performed at rest 45 minutes following the intravenous administration of Technetium 25m Sestamibi. Stress Procedure:  The patient received IV Lexiscan 0.4 mg over 15-seconds.  Technetium 21m Sestamibi injected at 30-seconds.  Patient experienced SOB, Left  Neck Discomfort and brief Chest Tightness and 75 mg Aminophylline IV was administered.  There were no significant changes with Lexiscan.  Quantitative spect images were obtained after a 45 minute delay.  Transient Ischemic Dilatation (Normal <1.22):  1.08  QGS EDV:  90 ml QGS ESV:  31 ml LV Ejection Fraction: 66%  Rest ECG: NSR-LVH  Stress ECG: No significant change from baseline ECG  QPS Raw Data Images:  Normal; no motion artifact; normal heart/lung ratio. Stress Images:  Normal homogeneous uptake in all areas of the myocardium. Rest Images:  Normal homogeneous uptake in all areas of the myocardium. Subtraction (SDS):  No evidence of ischemia.  Impression Exercise Capacity:  Lexiscan with no exercise. BP Response:  Normal blood pressure response. Clinical Symptoms:  mild chest tightness ECG Impression:  No significant ECG changes with Lexiscan. Comparison with Prior Nuclear Study: No significant change from previous study  Overall Impression:  Normal stress nuclear study.  LV Wall Motion:  NL LV Function; NL Wall Motion; LVEF 66%  Pixie Casino, MD, [facility] Board Certified in Nuclear Cardiology Attending Cardiologist [place], MD  02/13/2015 1:06 PM

## 2015-02-14 DIAGNOSIS — N186 End stage renal disease: Secondary | ICD-10-CM | POA: Diagnosis not present

## 2015-02-14 DIAGNOSIS — N2581 Secondary hyperparathyroidism of renal origin: Secondary | ICD-10-CM | POA: Diagnosis not present

## 2015-02-14 DIAGNOSIS — D631 Anemia in chronic kidney disease: Secondary | ICD-10-CM | POA: Diagnosis not present

## 2015-02-14 DIAGNOSIS — D509 Iron deficiency anemia, unspecified: Secondary | ICD-10-CM | POA: Diagnosis not present

## 2015-02-16 DIAGNOSIS — D631 Anemia in chronic kidney disease: Secondary | ICD-10-CM | POA: Diagnosis not present

## 2015-02-16 DIAGNOSIS — D509 Iron deficiency anemia, unspecified: Secondary | ICD-10-CM | POA: Diagnosis not present

## 2015-02-16 DIAGNOSIS — N2581 Secondary hyperparathyroidism of renal origin: Secondary | ICD-10-CM | POA: Diagnosis not present

## 2015-02-16 DIAGNOSIS — N186 End stage renal disease: Secondary | ICD-10-CM | POA: Diagnosis not present

## 2015-02-19 DIAGNOSIS — N2581 Secondary hyperparathyroidism of renal origin: Secondary | ICD-10-CM | POA: Diagnosis not present

## 2015-02-19 DIAGNOSIS — D631 Anemia in chronic kidney disease: Secondary | ICD-10-CM | POA: Diagnosis not present

## 2015-02-19 DIAGNOSIS — N186 End stage renal disease: Secondary | ICD-10-CM | POA: Diagnosis not present

## 2015-02-19 DIAGNOSIS — D509 Iron deficiency anemia, unspecified: Secondary | ICD-10-CM | POA: Diagnosis not present

## 2015-02-21 DIAGNOSIS — D509 Iron deficiency anemia, unspecified: Secondary | ICD-10-CM | POA: Diagnosis not present

## 2015-02-21 DIAGNOSIS — D631 Anemia in chronic kidney disease: Secondary | ICD-10-CM | POA: Diagnosis not present

## 2015-02-21 DIAGNOSIS — N2581 Secondary hyperparathyroidism of renal origin: Secondary | ICD-10-CM | POA: Diagnosis not present

## 2015-02-21 DIAGNOSIS — N186 End stage renal disease: Secondary | ICD-10-CM | POA: Diagnosis not present

## 2015-02-23 DIAGNOSIS — N186 End stage renal disease: Secondary | ICD-10-CM | POA: Diagnosis not present

## 2015-02-23 DIAGNOSIS — N2581 Secondary hyperparathyroidism of renal origin: Secondary | ICD-10-CM | POA: Diagnosis not present

## 2015-02-23 DIAGNOSIS — D509 Iron deficiency anemia, unspecified: Secondary | ICD-10-CM | POA: Diagnosis not present

## 2015-02-23 DIAGNOSIS — D631 Anemia in chronic kidney disease: Secondary | ICD-10-CM | POA: Diagnosis not present

## 2015-02-26 DIAGNOSIS — D631 Anemia in chronic kidney disease: Secondary | ICD-10-CM | POA: Diagnosis not present

## 2015-02-26 DIAGNOSIS — D509 Iron deficiency anemia, unspecified: Secondary | ICD-10-CM | POA: Diagnosis not present

## 2015-02-26 DIAGNOSIS — N186 End stage renal disease: Secondary | ICD-10-CM | POA: Diagnosis not present

## 2015-02-26 DIAGNOSIS — N2581 Secondary hyperparathyroidism of renal origin: Secondary | ICD-10-CM | POA: Diagnosis not present

## 2015-02-28 DIAGNOSIS — N186 End stage renal disease: Secondary | ICD-10-CM | POA: Diagnosis not present

## 2015-02-28 DIAGNOSIS — D631 Anemia in chronic kidney disease: Secondary | ICD-10-CM | POA: Diagnosis not present

## 2015-02-28 DIAGNOSIS — D509 Iron deficiency anemia, unspecified: Secondary | ICD-10-CM | POA: Diagnosis not present

## 2015-02-28 DIAGNOSIS — N2581 Secondary hyperparathyroidism of renal origin: Secondary | ICD-10-CM | POA: Diagnosis not present

## 2015-03-02 DIAGNOSIS — N2581 Secondary hyperparathyroidism of renal origin: Secondary | ICD-10-CM | POA: Diagnosis not present

## 2015-03-02 DIAGNOSIS — D631 Anemia in chronic kidney disease: Secondary | ICD-10-CM | POA: Diagnosis not present

## 2015-03-02 DIAGNOSIS — D509 Iron deficiency anemia, unspecified: Secondary | ICD-10-CM | POA: Diagnosis not present

## 2015-03-02 DIAGNOSIS — N186 End stage renal disease: Secondary | ICD-10-CM | POA: Diagnosis not present

## 2015-03-03 ENCOUNTER — Ambulatory Visit (INDEPENDENT_AMBULATORY_CARE_PROVIDER_SITE_OTHER): Payer: Medicare Other

## 2015-03-03 ENCOUNTER — Ambulatory Visit (INDEPENDENT_AMBULATORY_CARE_PROVIDER_SITE_OTHER): Payer: Medicare Other | Admitting: Family Medicine

## 2015-03-03 VITALS — BP 134/90 | HR 71 | Temp 97.6°F | Resp 20 | Ht 61.5 in | Wt 126.1 lb

## 2015-03-03 DIAGNOSIS — Z992 Dependence on renal dialysis: Secondary | ICD-10-CM | POA: Diagnosis not present

## 2015-03-03 DIAGNOSIS — S52501A Unspecified fracture of the lower end of right radius, initial encounter for closed fracture: Secondary | ICD-10-CM

## 2015-03-03 DIAGNOSIS — M79641 Pain in right hand: Secondary | ICD-10-CM

## 2015-03-03 DIAGNOSIS — M810 Age-related osteoporosis without current pathological fracture: Secondary | ICD-10-CM | POA: Diagnosis not present

## 2015-03-03 DIAGNOSIS — N186 End stage renal disease: Secondary | ICD-10-CM

## 2015-03-03 DIAGNOSIS — M25531 Pain in right wrist: Secondary | ICD-10-CM

## 2015-03-03 MED ORDER — HYDROCODONE-ACETAMINOPHEN 5-325 MG PO TABS: 1.0000 | ORAL_TABLET | ORAL | Status: DC | PRN

## 2015-03-03 NOTE — Progress Notes (Signed)
Chief Complaint:  Chief Complaint  Patient presents with  . Wrist Injury    right and hand wrist injury this morning.  wrist and hand very sore.  some swelling.     HPI: [name] is a 64 y.o. female who is here for her right hand and wrist pain that occurred earlier today. He was in her kitchen chasing her puppy Rottweiler terrier and slipped on a puddle of water. She braced her fall and now has right wrist and hand pain. She is unable to grasp with her thumb which she had to have problems with before. She does have intermittent numbness and tingling. This has been going on for a while now. She was wearing a brace at the time of the fall. She is osteoporotic and has end-stage renal disease. She has had calcifications from her end-stage renal disease in a prior x-ray on her left wrist. She describes it as sharp, minimal to moderate pain   Past Medical History  Diagnosis Date  . Anxiety   . Blood transfusion   . Depression   . GERD (gastroesophageal reflux disease)   . Hyperlipidemia   . Hypertension   . Neuromuscular disorder     neuropathy hand and legs  . Weight loss, unintentional   . Adenomatous polyp of colon 10/2010  . Diverticula, colon   . CHF (congestive heart failure)   . 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  Had deceased donor kidney transplant in 1996.  Had some early rejection then stable function for years, then had slow decline of function and went back on hemodialysis in 2012.  Gets HD TTS schedule at [facility] on [facility] still using L forearm AVF.     Marland Kitchen Anemia in CKD (chronic kidney disease) 11/07/2012  . Arthritis   . Osteoporosis    Past Surgical History  Procedure Laterality Date  . Kidney transplant  1996  . Back surgery    . Cervical fusion    . Av fistula placement      for dialysis  . Cholecystectomy  12/02/2012    Procedure: LAPAROSCOPIC  CHOLECYSTECTOMY WITH INTRAOPERATIVE CHOLANGIOGRAM;  Surgeon: Edward Jolly, MD;  Location: [place];  Service: General;  Laterality: N/A;   History   Social History  . Marital Status: Widowed    Spouse Name: N/A  . Number of Children: 2  . Years of Education: N/A   Social History Main Topics  . Smoking status: Former Smoker    Types: Cigarettes    Quit date: 12/31/1991  . Smokeless tobacco: Never Used  . Alcohol Use: No  . Drug Use: No  . Sexual Activity: No   Other Topics Concern  . None   Social History Narrative   Family History  Problem Relation Age of Onset  . Colon cancer Brother   . Cancer Brother   . Coronary artery disease Mother 44  . Hyperlipidemia Mother   . Hypertension Mother   . Esophageal cancer Neg Hx   . Stomach cancer Neg Hx   . Rectal cancer Neg Hx    Allergies  Allergen Reactions  . Sulfa Antibiotics Other (See Comments)    Both parents allergic-so will not take   Prior to Admission medications   Medication Sig Start Date End Date Taking? Authorizing Provider  allopurinol (ZYLOPRIM) 300 MG tablet Take 150 mg by mouth at bedtime.  Yes Historical Provider, MD  aspirin EC 81 MG tablet Take 81 mg by mouth daily.   Yes Historical Provider, MD  b complex-vitamin c-folic acid (NEPHRO-VITE) 0.8 MG TABS tablet Take 1 tablet by mouth at bedtime.   Yes Historical Provider, MD  calcium carbonate (TUMS - DOSED IN MG ELEMENTAL CALCIUM) 500 MG chewable tablet Chew 1 tablet by mouth 3 (three) times daily as needed. For acid   Yes Historical Provider, MD  cinacalcet (SENSIPAR) 30 MG tablet Take 30 mg by mouth daily.   Yes Historical Provider, MD  doxazosin (CARDURA) 8 MG tablet Take 1 tablet (8 mg total) by mouth 2 (two) times daily. Patient taking differently: Take 8 mg by mouth 2 (two) times daily as needed (hypertension).  11/14/14  Yes Deanna M Didiano, DO  HYDROcodone-acetaminophen (NORCO) 5-325 MG per tablet Take 1 tablet by mouth every 4 (four) hours as  needed for moderate pain. [id number]  Yes Delora Fuel, MD  labetalol (NORMODYNE) 300 MG tablet Take 300 mg by mouth 2 (two) times daily. 11/09/12  Yes Velvet Bathe, MD  losartan (COZAAR) 100 MG tablet Take 100 mg by mouth at bedtime.    Yes Historical Provider, MD  Multiple Vitamin (MULTIVITAMIN WITH MINERALS) TABS tablet Take 1 tablet by mouth daily.   Yes Historical Provider, MD  promethazine (PHENERGAN) 25 MG tablet Take 25 mg by mouth every 6 (six) hours as needed for nausea or vomiting.   Yes Historical Provider, MD  simvastatin (ZOCOR) 10 MG tablet Take 10 mg by mouth daily.   Yes Historical Provider, MD  sorbitol 70 % solution Take 60 mLs by mouth daily as needed (constipation).    Yes Historical Provider, MD  zolpidem (AMBIEN) 10 MG tablet Take 10 mg by mouth at bedtime.   Yes Historical Provider, MD     ROS: The patient denies fevers, chills, night sweats, unintentional weight loss, chest pain, palpitations, wheezing, dyspnea on exertion, nausea, vomiting, abdominal pain, dysuria, hematuria, melena + n/t/w  All other systems have been reviewed and were otherwise negative with the exception of those mentioned in the HPI and as above.    PHYSICAL EXAM: Filed Vitals:   03/03/15 1134  BP: 134/90  Pulse: 71  Temp: 97.6 F (36.4 C)  Resp: 20   Filed Vitals:   03/03/15 1134  Height: 5' 1.5" (1.562 m)  Weight: 126 lb 2 oz (57.21 kg)   Body mass index is 23.45 kg/(m^2).  General: Alert, no acute distress HEENT:  Normocephalic, atraumatic, oropharynx patent. EOMI, PERRLA Cardiovascular:  Regular rate and rhythm, no rubs or gallop, + radial pulse intact. No pedal edema.  Respiratory: Clear to auscultation bilaterally.  No wheezes, rales, or rhonchi.  No cyanosis, no use of accessory musculature GI: No organomegaly, abdomen is soft and non-tender, positive bowel sounds.  No masses. Skin: No rashes. Neurologic: Facial musculature symmetric. Psychiatric: Patient is appropriate throughout  our interaction. Lymphatic: No cervical lymphadenopathy Musculoskeletal: Gait intact. Tender at distal radius, tender at base of thumb Decrease grip strength with thumb, she has no open wounds, she has sensation, minimal swelling. Good radial pulse and cap refill  LABS: Results for orders placed or performed during the hospital encounter of 01/22/15  CBC  Result Value Ref Range   WBC 6.7 4.0 - 10.5 K/uL   RBC 3.84 (L) 3.87 - 5.11 MIL/uL   Hemoglobin 11.1 (L) 12.0 - 15.0 g/dL   HCT 36.7 36.0 - 46.0 %   MCV 95.6 78.0 - 100.0  fL   MCH 28.9 26.0 - 34.0 pg   MCHC 30.2 30.0 - 36.0 g/dL   RDW 19.9 (H) 11.5 - 15.5 %   Platelets 279 150 - 400 K/uL  Basic metabolic panel  Result Value Ref Range   Sodium 136 135 - 145 mmol/L   Potassium 4.5 3.5 - 5.1 mmol/L   Chloride 92 (L) 96 - 112 mmol/L   CO2 31 19 - 32 mmol/L   Glucose, Bld 86 70 - 99 mg/dL   BUN 76 (H) 6 - 23 mg/dL   Creatinine, Ser 9.09 (H) 0.50 - 1.10 mg/dL   Calcium 10.3 8.4 - 10.5 mg/dL   GFR calc non Af Amer 4 (L) >90 mL/min   GFR calc Af Amer 5 (L) >90 mL/min   Anion gap 13 5 - 15  BNP (order ONLY if patient complains of dyspnea/SOB AND you have documented it for THIS visit)  Result Value Ref Range   B Natriuretic Peptide 1438.6 (H) 0.0 - 100.0 pg/mL  I-stat troponin, ED (not at [facility])  Result Value Ref Range   Troponin i, poc 0.01 0.00 - 0.08 ng/mL   Comment 3             EKG/XRAY:   Primary read interpreted by Dr. Marin Comment at [place]. Displaced distal radius fracture   ASSESSMENT/PLAN: Encounter Diagnoses  Name Primary?  . Wrist pain, acute, right   . Right hand pain   . Osteoporosis   . ESRD on dialysis   . Distal radius fracture, right, closed, initial encounter Yes   64 year old African-American female with a PMH of ESRD, osteoporosis, HTN, hyperlipidemia  with right distal radius fracture. Referred to orthopedic, preferrably for tomorrow since she may need a closed reduction and am worried since she already has  CAD and is ESRD [place] splint was placed, sling was given, prescribed Norco for pain when necessary Fu prn   Gross sideeffects, risk and benefits, and alternatives of medications d/w patient. Patient is aware that all medications have potential sideeffects and we are unable to predict every sideeffect or drug-drug interaction that may occur.  Daune Colgate, [place], DO 03/03/2015 12:33 PM

## 2015-03-03 NOTE — Progress Notes (Deleted)
   Subjective:    Patient ID: [name], female    DOB: 1951/10/12, 64 y.o.   MRN: [id number]  HPI    Review of Systems     Objective:   Physical Exam        Assessment & Plan:

## 2015-03-04 DIAGNOSIS — S52501A Unspecified fracture of the lower end of right radius, initial encounter for closed fracture: Secondary | ICD-10-CM | POA: Diagnosis not present

## 2015-03-05 DIAGNOSIS — N2581 Secondary hyperparathyroidism of renal origin: Secondary | ICD-10-CM | POA: Diagnosis not present

## 2015-03-05 DIAGNOSIS — D631 Anemia in chronic kidney disease: Secondary | ICD-10-CM | POA: Diagnosis not present

## 2015-03-05 DIAGNOSIS — N186 End stage renal disease: Secondary | ICD-10-CM | POA: Diagnosis not present

## 2015-03-05 DIAGNOSIS — D509 Iron deficiency anemia, unspecified: Secondary | ICD-10-CM | POA: Diagnosis not present

## 2015-03-06 ENCOUNTER — Encounter: Payer: Medicare Other | Admitting: Internal Medicine

## 2015-03-06 IMAGING — CT CT HEAD W/O CM
1 series · 16 of 29 positions shown, 20 images · non-contrast
Comparison: None

CLINICAL DATA: Severe headache with history of hypertension.

EXAM:
CT HEAD WITHOUT CONTRAST
TECHNIQUE: Contiguous axial images were obtained from the base of the skull
through the vertex without contrast.

[Series 2: head 5.0 h30s · axial · 0.42mm/px · z∈[-100,+30]mm · 16 of 29 slices shown, 20 images]
[im 2/29  brain]
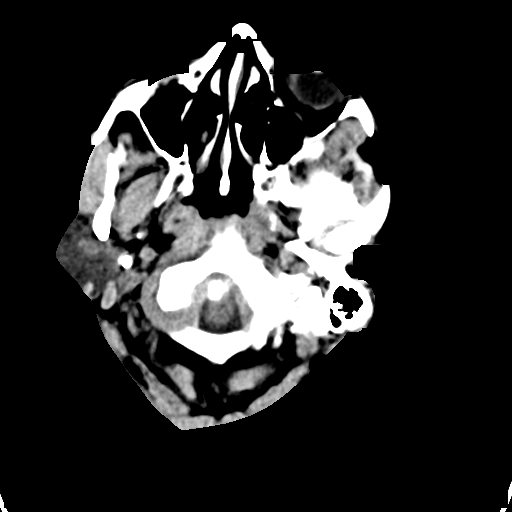
[im 2/29  bone]
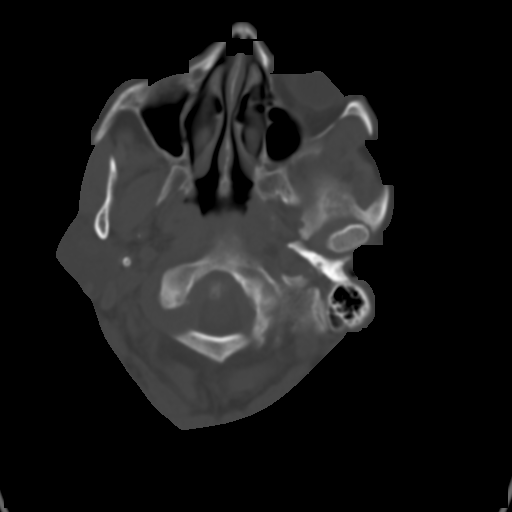
[im 4/29  brain]
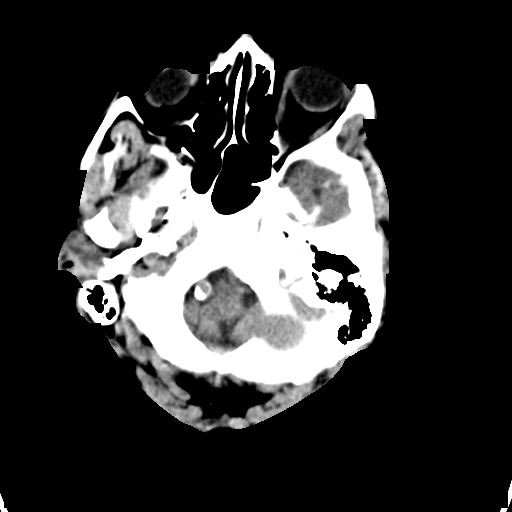
[im 6/29  brain]
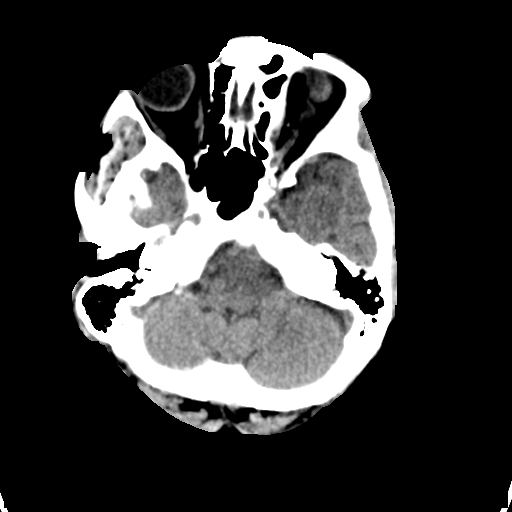
[im 7/29  brain]
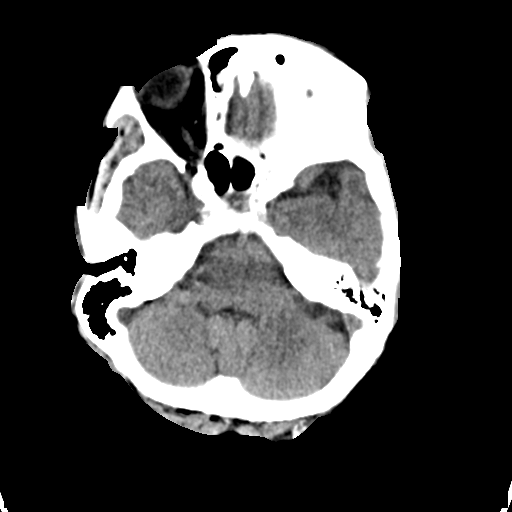
[im 9/29  brain]
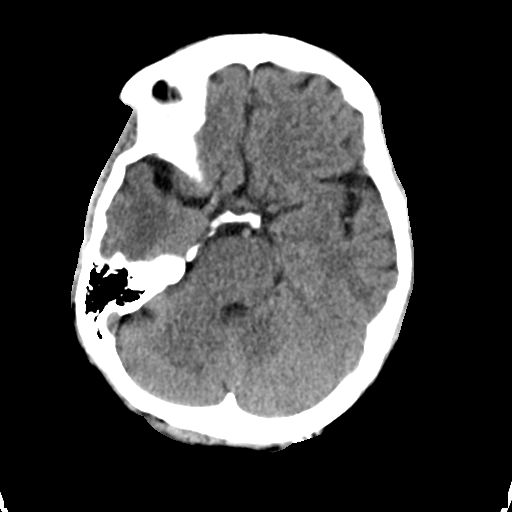
[im 9/29  bone]
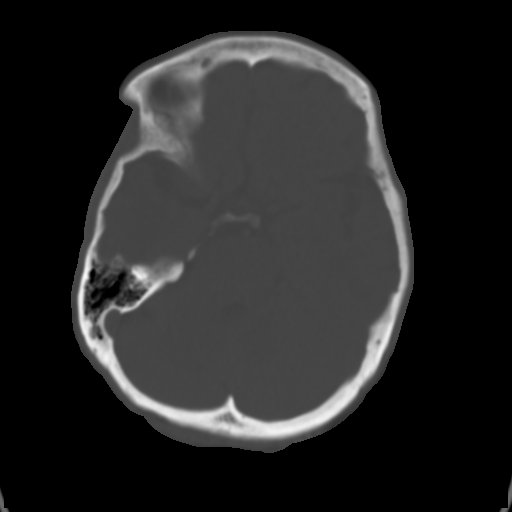
[im 11/29  brain]
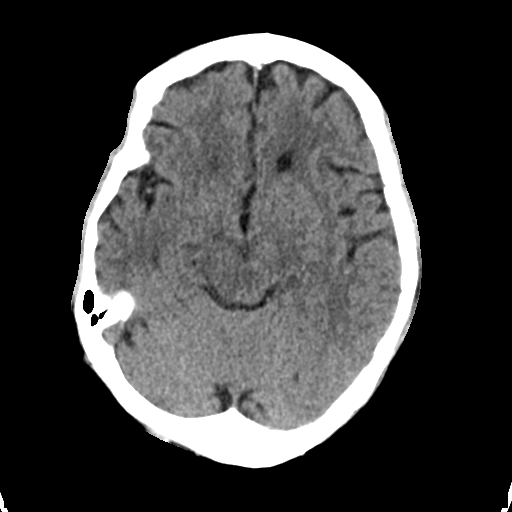
[im 12/29  brain]
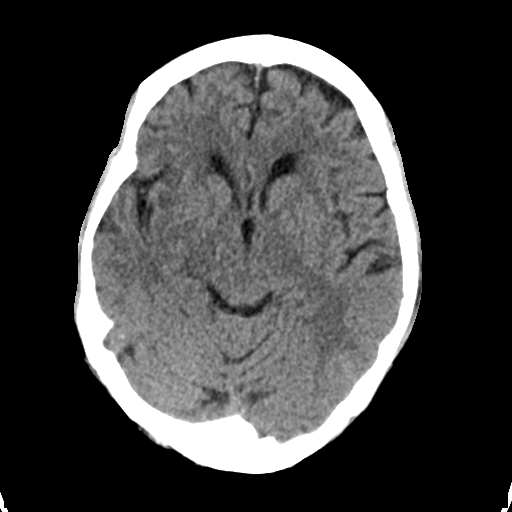
[im 14/29  brain]
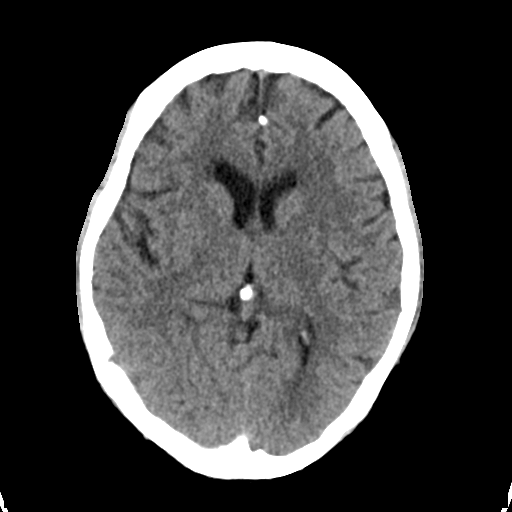
[im 16/29  brain]
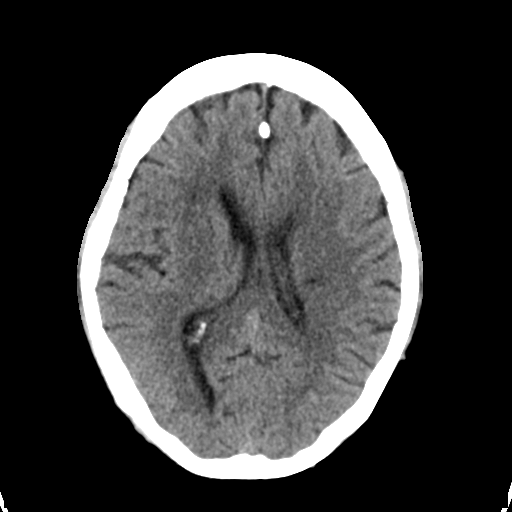
[im 16/29  bone]
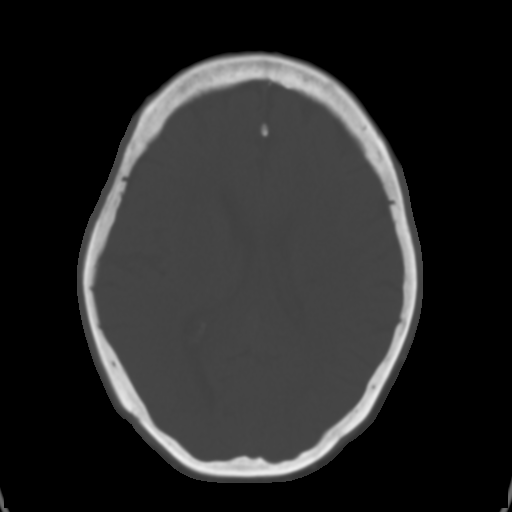
[im 18/29  brain]
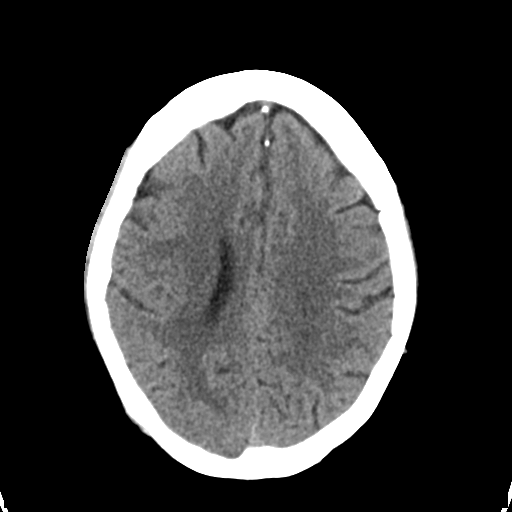
[im 19/29  brain]
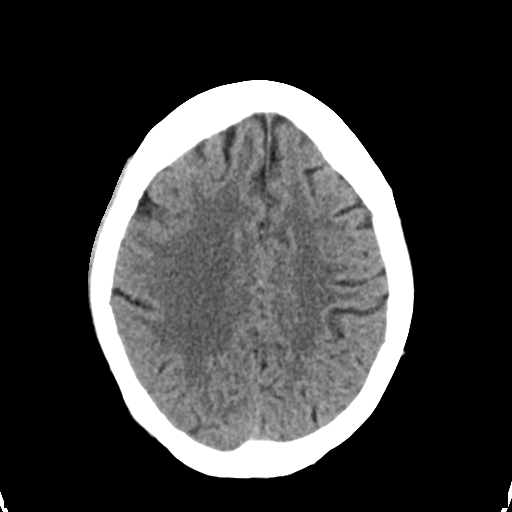
[im 21/29  brain]
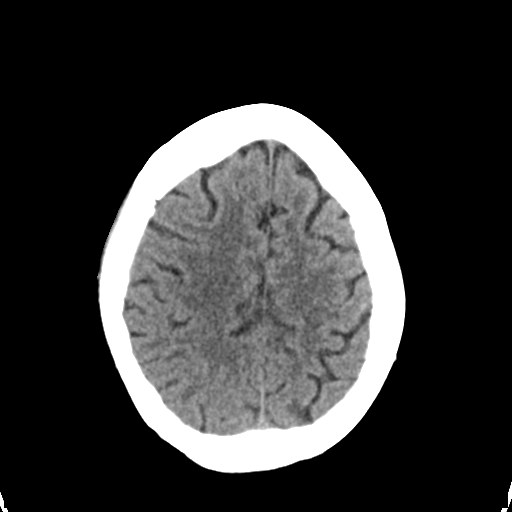
[im 23/29  brain]
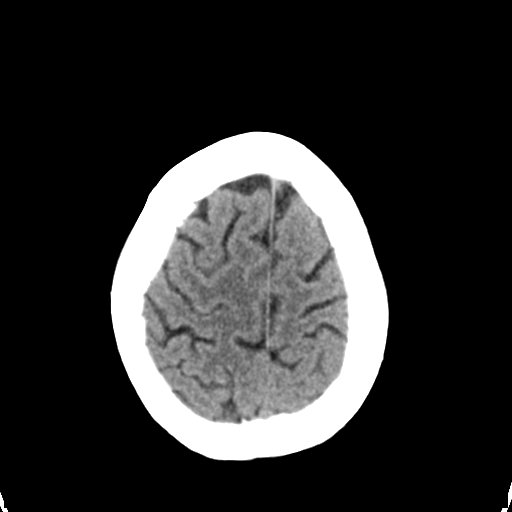
[im 23/29  bone]
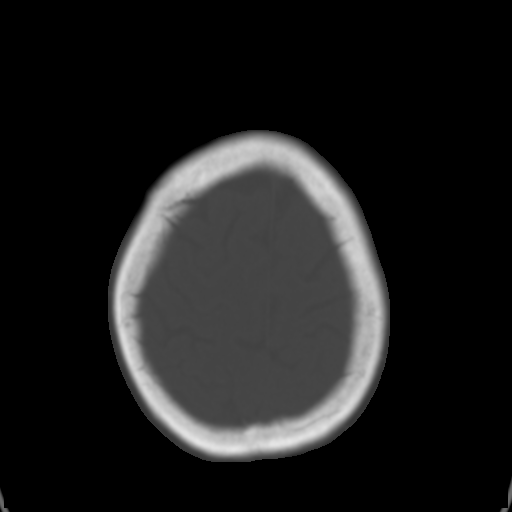
[im 24/29  brain]
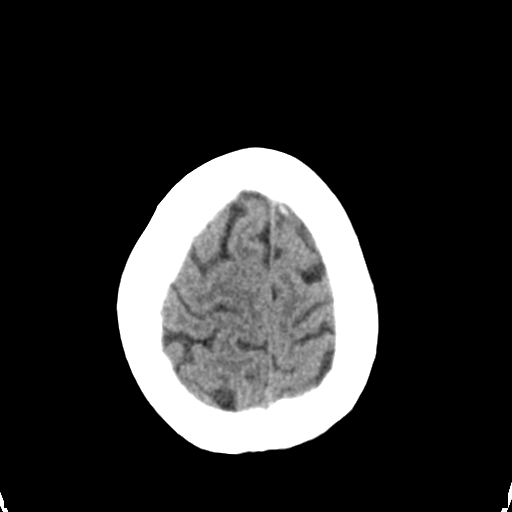
[im 26/29  brain]
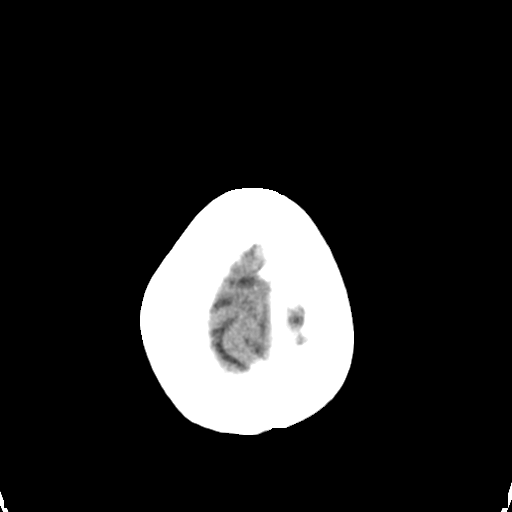
[im 28/29  brain]
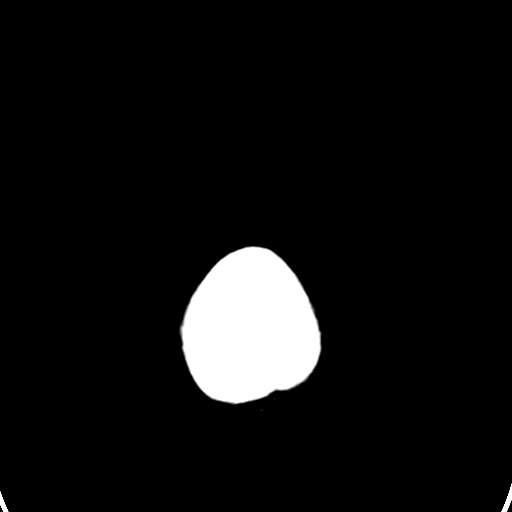

[16 of 29 positions shown; findings below may reference images not displayed]

FINDINGS: Normal appearance of the intracranial structures. No evidence for
acute hemorrhage, mass lesion, midline shift, hydrocephalus or large
infarct. No acute bony abnormality. Vascular calcification. The
visualized sinuses are clear.
IMPRESSION: No acute intracranial abnormality.

## 2015-03-07 DIAGNOSIS — N186 End stage renal disease: Secondary | ICD-10-CM | POA: Diagnosis not present

## 2015-03-07 DIAGNOSIS — D631 Anemia in chronic kidney disease: Secondary | ICD-10-CM | POA: Diagnosis not present

## 2015-03-07 DIAGNOSIS — N2581 Secondary hyperparathyroidism of renal origin: Secondary | ICD-10-CM | POA: Diagnosis not present

## 2015-03-07 DIAGNOSIS — D509 Iron deficiency anemia, unspecified: Secondary | ICD-10-CM | POA: Diagnosis not present

## 2015-03-09 DIAGNOSIS — D509 Iron deficiency anemia, unspecified: Secondary | ICD-10-CM | POA: Diagnosis not present

## 2015-03-09 DIAGNOSIS — N2581 Secondary hyperparathyroidism of renal origin: Secondary | ICD-10-CM | POA: Diagnosis not present

## 2015-03-09 DIAGNOSIS — D631 Anemia in chronic kidney disease: Secondary | ICD-10-CM | POA: Diagnosis not present

## 2015-03-09 DIAGNOSIS — N186 End stage renal disease: Secondary | ICD-10-CM | POA: Diagnosis not present

## 2015-03-09 IMAGING — CT CT ABD-PELV W/O CM
2 of 4 series · 13 of 46 positions shown, 15 images · non-contrast
Comparison: Chest CT 05/02/2014.  Pelvic MRI 08/19/2012.

CLINICAL DATA: Mid to low abdominal pain.

EXAM:
CT ABDOMEN AND PELVIS WITHOUT CONTRAST
TECHNIQUE: Multidetector CT imaging of the abdomen and pelvis was performed
following the standard protocol without IV contrast.

[Series 201: routine, idose (2) · axial · 0.66mm/px · z∈[-450,-150]mm · 10 of 73 slices shown, 12 images]
[im 7/73  soft-tissue]
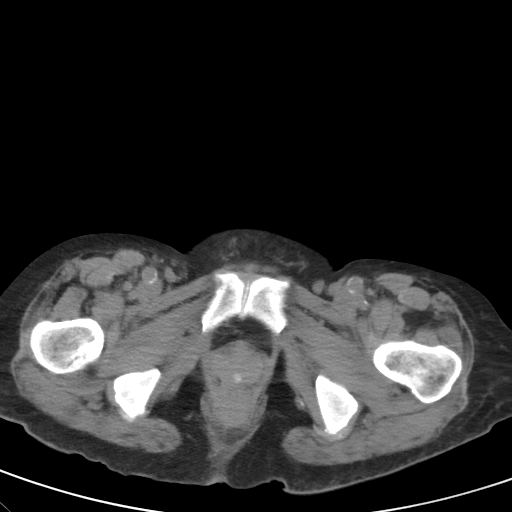
[im 7/73  bone]
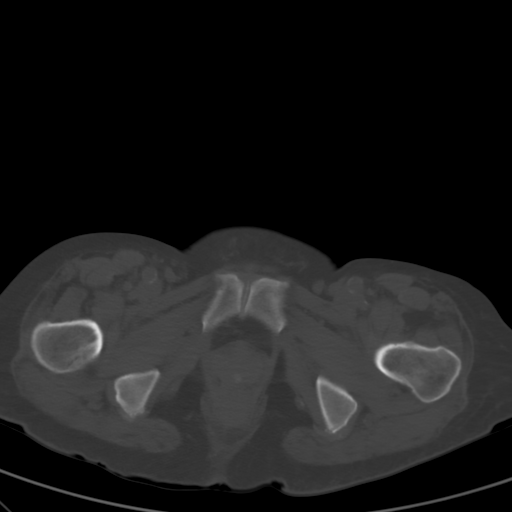
[im 13/73  soft-tissue]
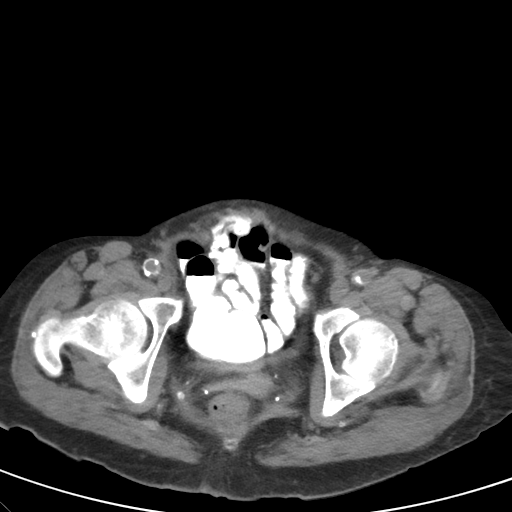
[im 19/73  soft-tissue]
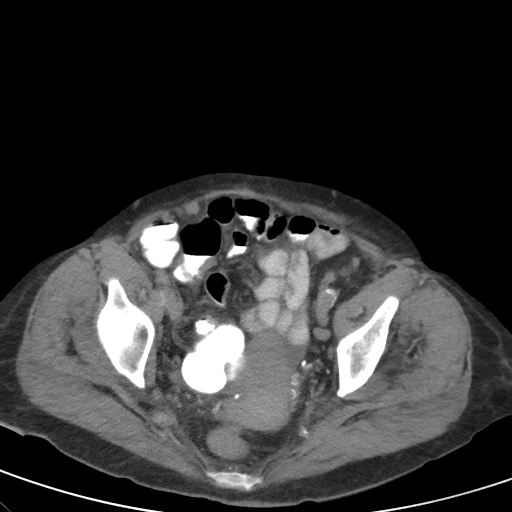
[im 28/73  soft-tissue]
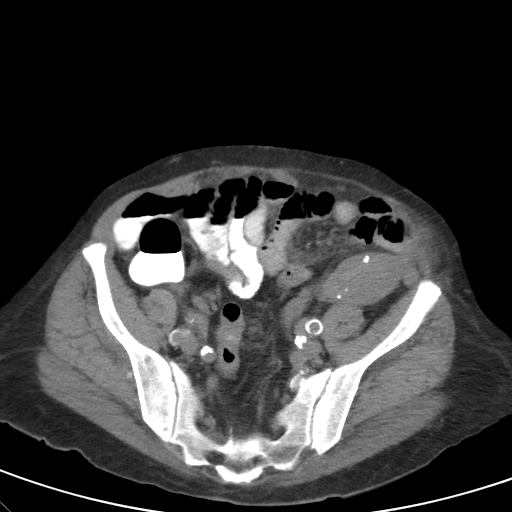
[im 34/73  soft-tissue]
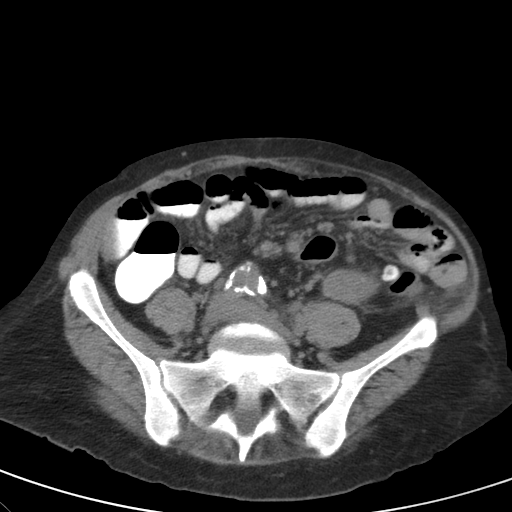
[im 40/73  soft-tissue]
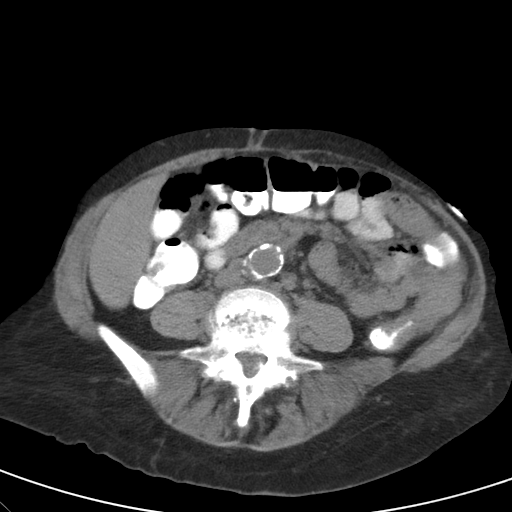
[im 46/73  soft-tissue]
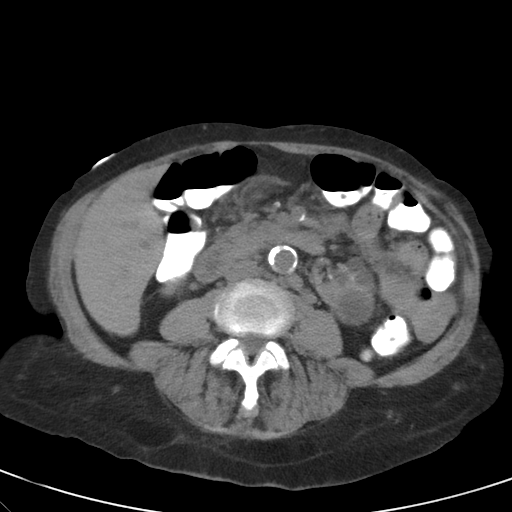
[im 55/73  soft-tissue]
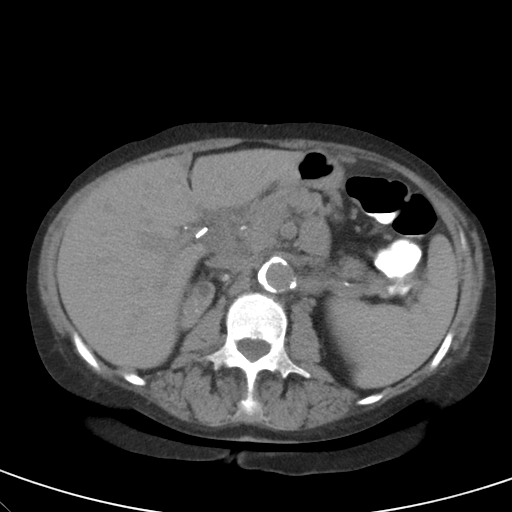
[im 61/73  soft-tissue]
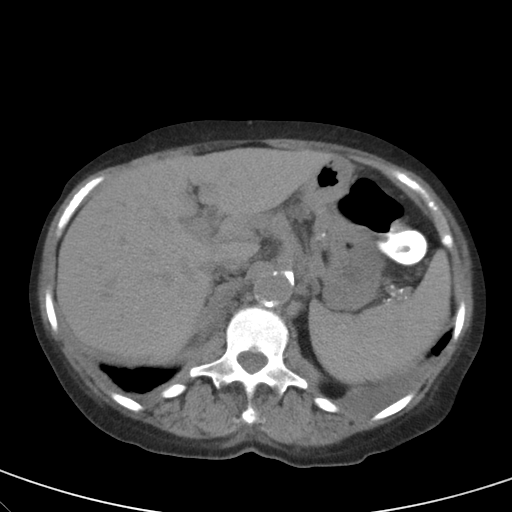
[im 61/73  bone]
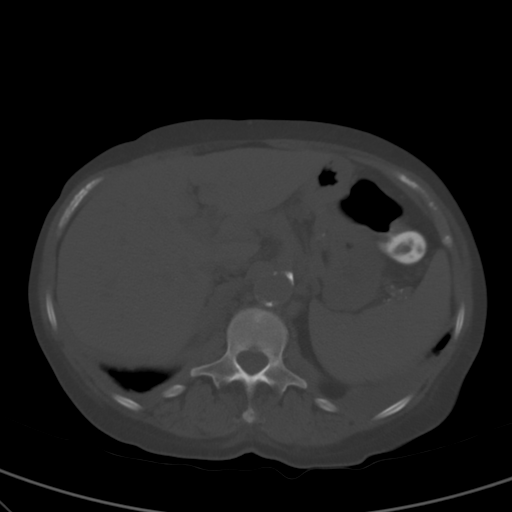
[im 67/73  soft-tissue]
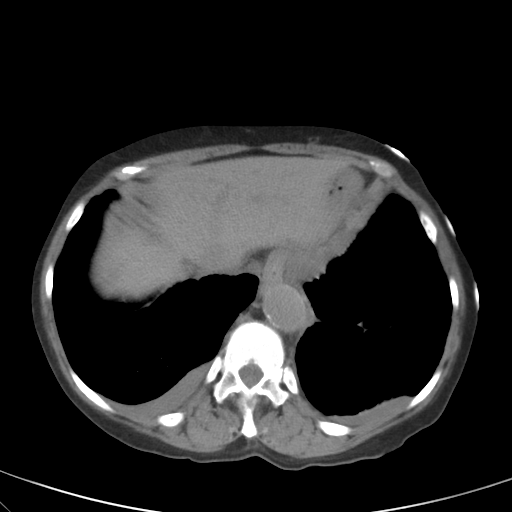

[Series 203: coronals, idose (2) · coronal · 0.50mm/px · 3 of 131 slices shown]
[im 44/131  soft-tissue]
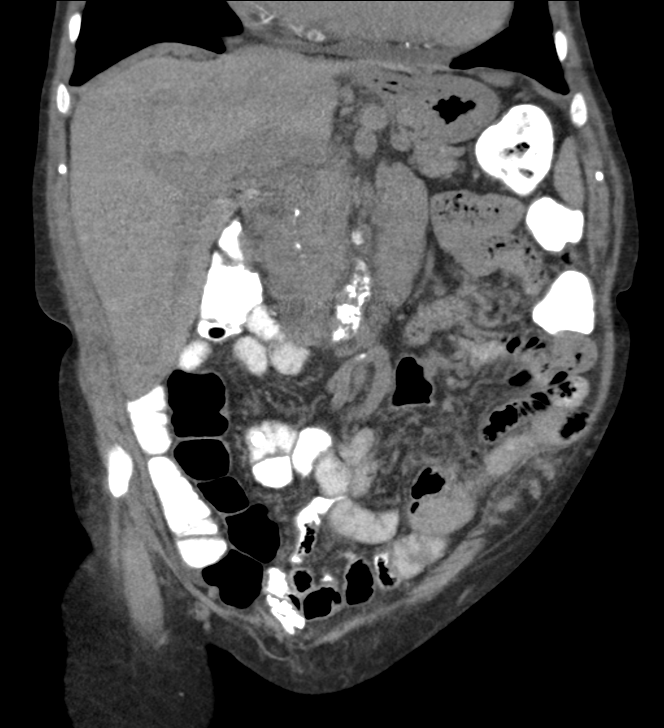
[im 58/131  soft-tissue]
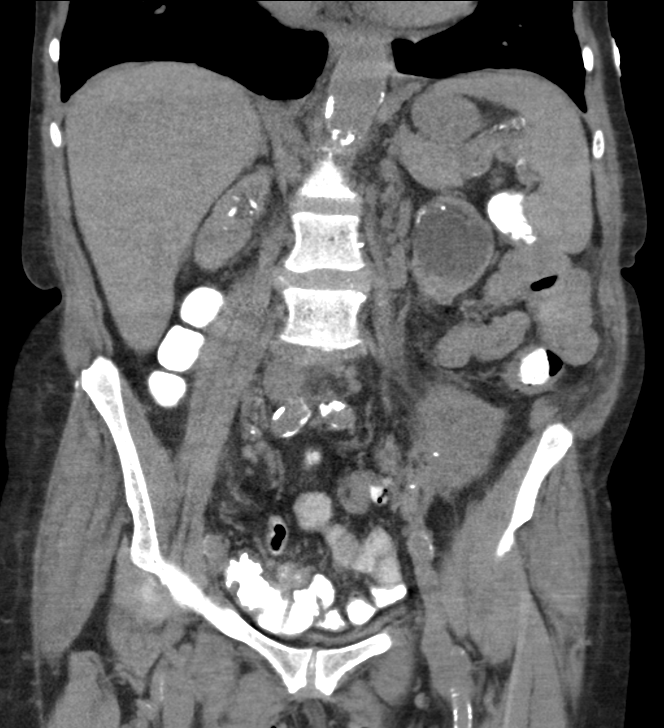
[im 73/131  soft-tissue]
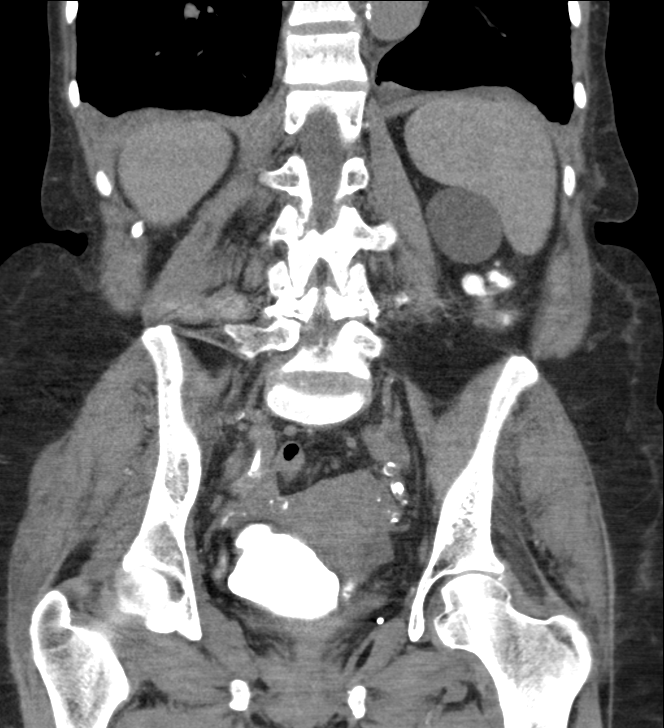

[13 of 46 positions shown; findings below may reference images not displayed]

FINDINGS: Lung bases: There are new small bilateral pleural effusions. There
is small pericardial effusion. The heart is enlarged. There is
atherosclerosis of the coronary arteries. Peribronchial nodularity
is again present in both lung bases, similar to the priors chest CT.

Liver/Biliary/Pancreas: As evaluated in the noncontrast state, the
liver has a stable appearance without focal abnormality. There is no
significant biliary dilatation status post cholecystectomy. The
pancreas appears normal.

Spleen/Adrenal glands: The spleen and right adrenal gland appear
normal. The left adrenal gland is not well visualized.

Kidneys/Ureters/Bladder: Both kidneys are markedly atrophied with
cortical thinning. Low-density renal lesions bilaterally are grossly
stable, likely cysts. There is no hydronephrosis. The bladder is
nearly empty and suboptimally evaluated.

Bowel/Peritoneum: The stomach and proximal small bowel are poorly
distended and suboptimally distended. Gastric wall thickening cannot
be excluded. There is no small bowel distension. The colon is
opacified with contrast and demonstrates no focal abnormalities.
There is mild distal colonic diverticulosis. The appendix appears
normal. No ascites or peritoneal nodularity.

Retroperitoneum/Pelvis: [DATE] x 3.3 cm soft tissue mass in the left
iliac fossa on image 46 is most consistent with a failed renal
transplant as correlated with the prior pelvic MRI. However, there
are a few mildly enlarged retroperitoneal and pelvic lymph nodes.
There is a left periaortic node measuring 1.3 cm on image 20. More
inferiorly, there is a 1.0 cm left periaortic node on image 25.
There are prominent external iliac nodes, especially on the left.
There is diffuse atherosclerosis of the aorta, its branches and the
iliac arteries. The uterus and ovaries appear unremarkable.

Abdominal wall: There is a small suprapubic anterior abdominal wall
hernia containing small bowel. This may be incisional. There are no
signs of bowel incarceration or obstruction.

Musculoskeletal: No acute or significant osseus findings. There are
degenerative changes throughout the spine. At L4-5, there is a grade
1 anterolisthesis associated with marked endplate degeneration.
There is an interspinous fusion device at that level which appears
disengaged and posteriorly displaced. There is significant
biforaminal stenosis at that level.
IMPRESSION: 1. Prominent retroperitoneal and pelvic lymph nodes of uncertain
etiology. A lymphoproliferative process cannot be excluded.
2. Suspected failed renal transplant in the left iliac fossa. The
native kidneys are markedly atrophied with cystic lesions
bilaterally, grossly stable.
3. Suprapubic ventral abdominal wall hernia containing small bowel.
No signs of bowel incarceration or obstruction.
4. Diffuse atherosclerosis.
5. Sigmoid diverticulosis without definite acute inflammation.
Limited evaluation of the stomach and proximal small bowel.
6. Small bilateral pleural effusions.
7. Apparent hardware displacement status post L4-5 interspinous
fusion.

## 2015-03-09 IMAGING — CR DG CHEST 2V
2 series · 2 of 2 positions shown · non-contrast
Comparison: CT 07/31/2014.  Chest x-ray 05/02/2014.

CLINICAL DATA: Shortness of breath.

EXAM:
CHEST  2 VIEW

[w chest pa]
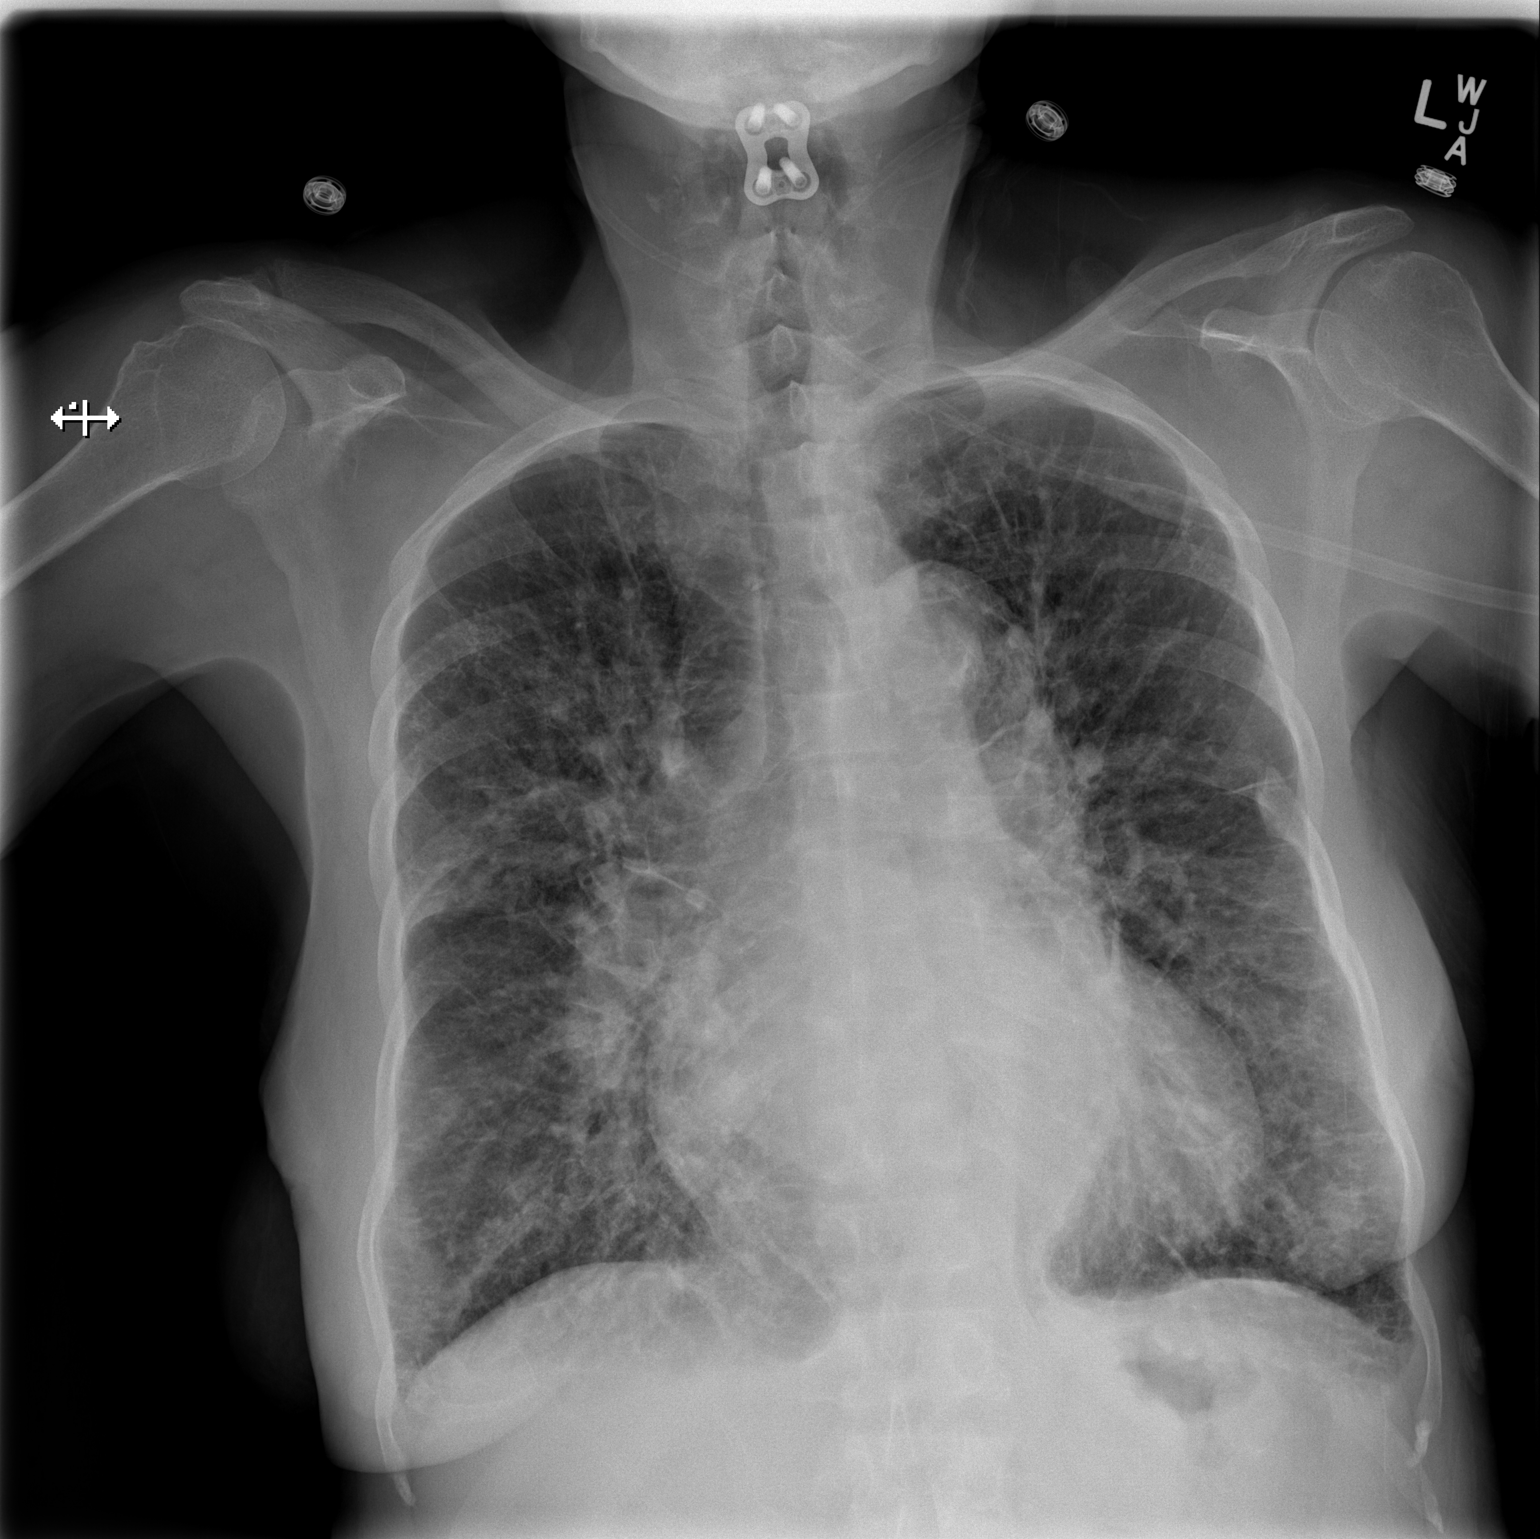

[w chest lat]
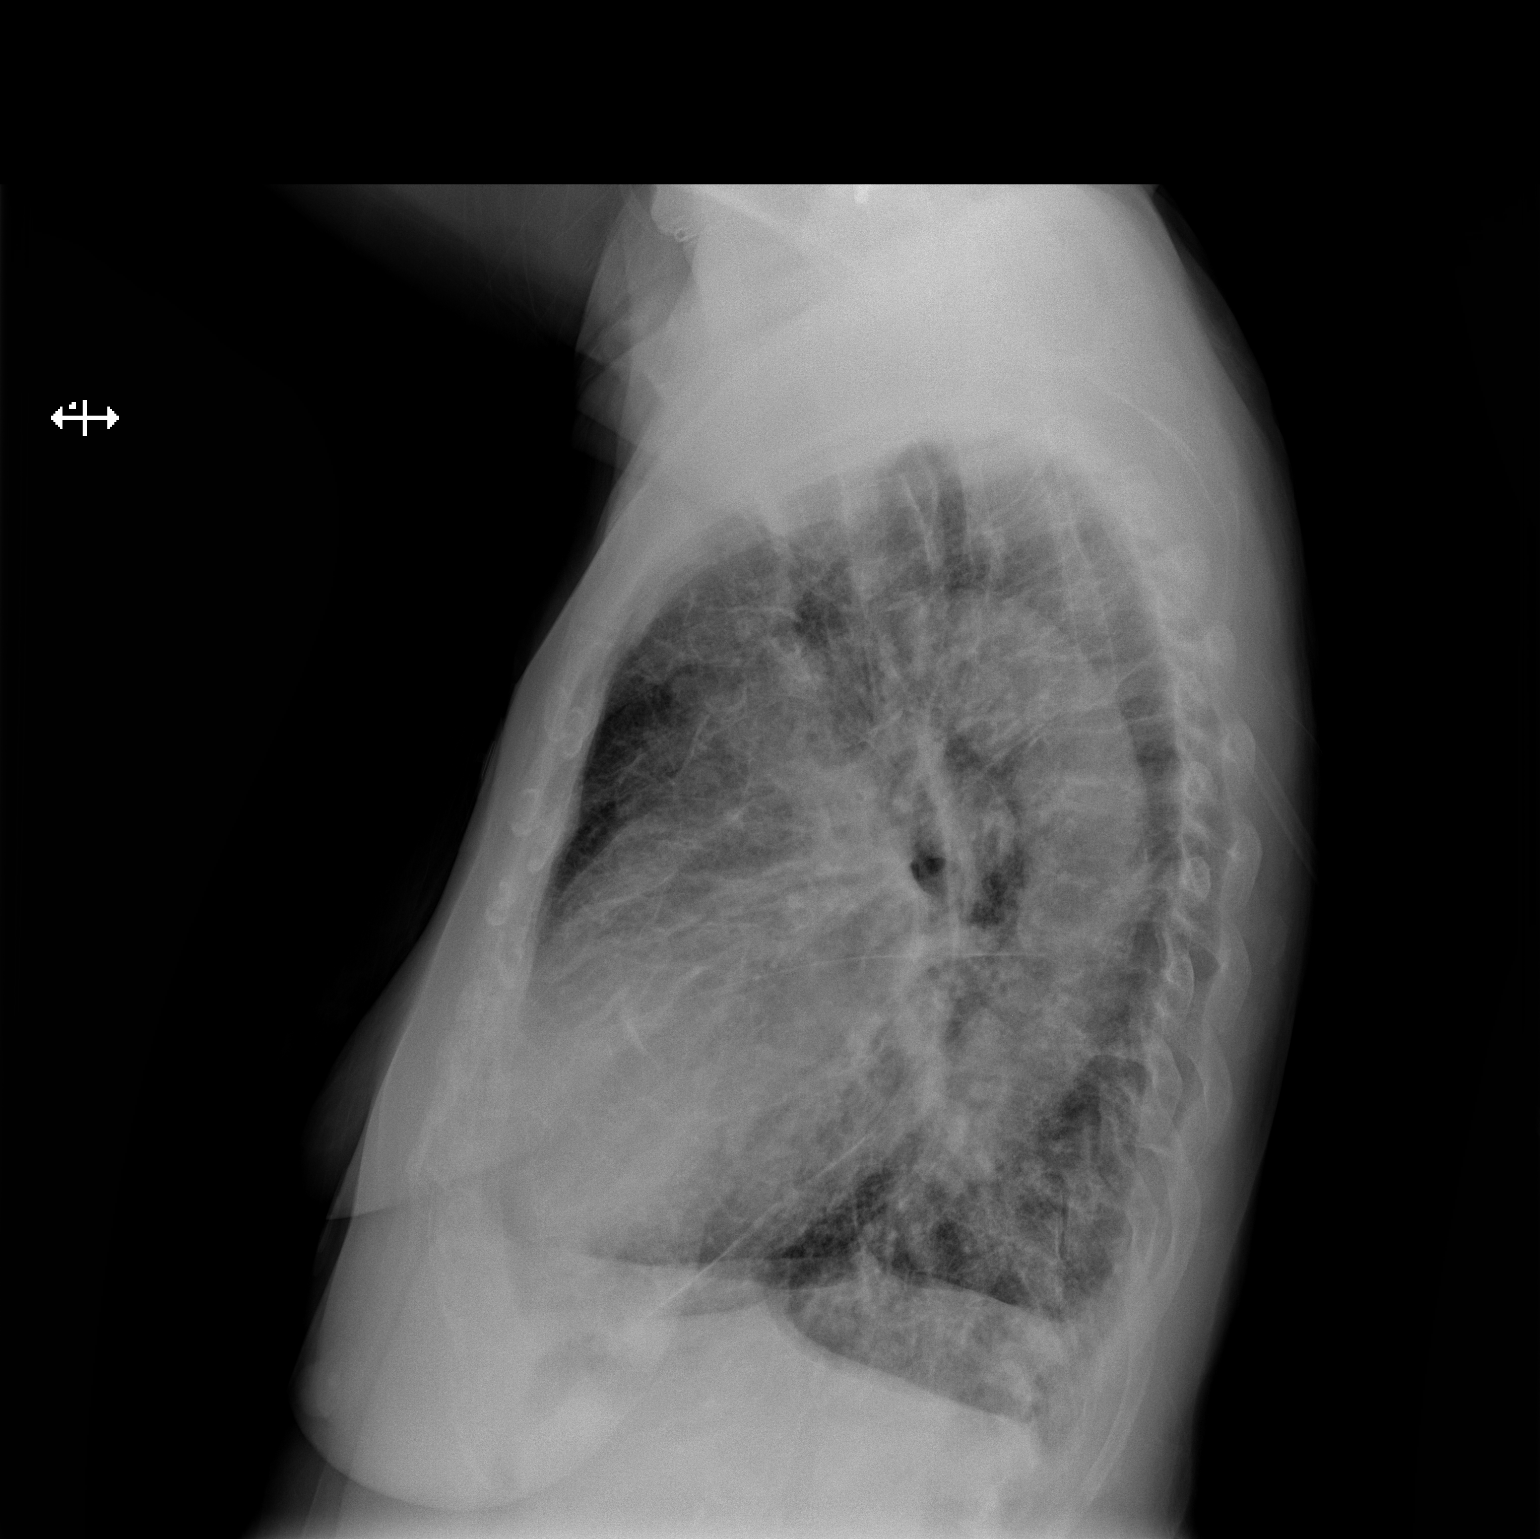

[2 of 2 positions shown; findings below may reference images not displayed]

FINDINGS: Cardiomegaly with prominent pulmonary venous congestion and diffuse
pulmonary interstitial edema present. Small pleural effusions are
present. These finding are consistent congestive heart failure.
Interstitial edema has increased from prior exams. No pneumothorax.
No acute osseous abnormality. Cervical spine fusion. Old left
posterior seventh rib fracture. Carotid atherosclerotic vascular
disease.
IMPRESSION: 1. Progressive congestive heart failure with pulmonary interstitial
edema.
2. Carotid vascular disease.

## 2015-03-11 DIAGNOSIS — S52501D Unspecified fracture of the lower end of right radius, subsequent encounter for closed fracture with routine healing: Secondary | ICD-10-CM | POA: Diagnosis not present

## 2015-03-11 DIAGNOSIS — M25532 Pain in left wrist: Secondary | ICD-10-CM | POA: Diagnosis not present

## 2015-03-12 DIAGNOSIS — D631 Anemia in chronic kidney disease: Secondary | ICD-10-CM | POA: Diagnosis not present

## 2015-03-12 DIAGNOSIS — D509 Iron deficiency anemia, unspecified: Secondary | ICD-10-CM | POA: Diagnosis not present

## 2015-03-12 DIAGNOSIS — N186 End stage renal disease: Secondary | ICD-10-CM | POA: Diagnosis not present

## 2015-03-12 DIAGNOSIS — N2581 Secondary hyperparathyroidism of renal origin: Secondary | ICD-10-CM | POA: Diagnosis not present

## 2015-03-14 DIAGNOSIS — N186 End stage renal disease: Secondary | ICD-10-CM | POA: Diagnosis not present

## 2015-03-14 DIAGNOSIS — T8612 Kidney transplant failure: Secondary | ICD-10-CM | POA: Diagnosis not present

## 2015-03-14 DIAGNOSIS — D509 Iron deficiency anemia, unspecified: Secondary | ICD-10-CM | POA: Diagnosis not present

## 2015-03-14 DIAGNOSIS — Z992 Dependence on renal dialysis: Secondary | ICD-10-CM | POA: Diagnosis not present

## 2015-03-14 DIAGNOSIS — N2581 Secondary hyperparathyroidism of renal origin: Secondary | ICD-10-CM | POA: Diagnosis not present

## 2015-03-14 DIAGNOSIS — D631 Anemia in chronic kidney disease: Secondary | ICD-10-CM | POA: Diagnosis not present

## 2015-03-16 DIAGNOSIS — N2581 Secondary hyperparathyroidism of renal origin: Secondary | ICD-10-CM | POA: Diagnosis not present

## 2015-03-16 DIAGNOSIS — D631 Anemia in chronic kidney disease: Secondary | ICD-10-CM | POA: Diagnosis not present

## 2015-03-16 DIAGNOSIS — N186 End stage renal disease: Secondary | ICD-10-CM | POA: Diagnosis not present

## 2015-03-16 DIAGNOSIS — D509 Iron deficiency anemia, unspecified: Secondary | ICD-10-CM | POA: Diagnosis not present

## 2015-03-19 DIAGNOSIS — N186 End stage renal disease: Secondary | ICD-10-CM | POA: Diagnosis not present

## 2015-03-19 DIAGNOSIS — D631 Anemia in chronic kidney disease: Secondary | ICD-10-CM | POA: Diagnosis not present

## 2015-03-19 DIAGNOSIS — D509 Iron deficiency anemia, unspecified: Secondary | ICD-10-CM | POA: Diagnosis not present

## 2015-03-19 DIAGNOSIS — N2581 Secondary hyperparathyroidism of renal origin: Secondary | ICD-10-CM | POA: Diagnosis not present

## 2015-03-20 DIAGNOSIS — S52501D Unspecified fracture of the lower end of right radius, subsequent encounter for closed fracture with routine healing: Secondary | ICD-10-CM | POA: Diagnosis not present

## 2015-03-21 DIAGNOSIS — D631 Anemia in chronic kidney disease: Secondary | ICD-10-CM | POA: Diagnosis not present

## 2015-03-21 DIAGNOSIS — D509 Iron deficiency anemia, unspecified: Secondary | ICD-10-CM | POA: Diagnosis not present

## 2015-03-21 DIAGNOSIS — N2581 Secondary hyperparathyroidism of renal origin: Secondary | ICD-10-CM | POA: Diagnosis not present

## 2015-03-21 DIAGNOSIS — N186 End stage renal disease: Secondary | ICD-10-CM | POA: Diagnosis not present

## 2015-03-23 DIAGNOSIS — N2581 Secondary hyperparathyroidism of renal origin: Secondary | ICD-10-CM | POA: Diagnosis not present

## 2015-03-23 DIAGNOSIS — D631 Anemia in chronic kidney disease: Secondary | ICD-10-CM | POA: Diagnosis not present

## 2015-03-23 DIAGNOSIS — N186 End stage renal disease: Secondary | ICD-10-CM | POA: Diagnosis not present

## 2015-03-23 DIAGNOSIS — D509 Iron deficiency anemia, unspecified: Secondary | ICD-10-CM | POA: Diagnosis not present

## 2015-03-26 DIAGNOSIS — N2581 Secondary hyperparathyroidism of renal origin: Secondary | ICD-10-CM | POA: Diagnosis not present

## 2015-03-26 DIAGNOSIS — D509 Iron deficiency anemia, unspecified: Secondary | ICD-10-CM | POA: Diagnosis not present

## 2015-03-26 DIAGNOSIS — D631 Anemia in chronic kidney disease: Secondary | ICD-10-CM | POA: Diagnosis not present

## 2015-03-26 DIAGNOSIS — N186 End stage renal disease: Secondary | ICD-10-CM | POA: Diagnosis not present

## 2015-03-28 DIAGNOSIS — N186 End stage renal disease: Secondary | ICD-10-CM | POA: Diagnosis not present

## 2015-03-28 DIAGNOSIS — N2581 Secondary hyperparathyroidism of renal origin: Secondary | ICD-10-CM | POA: Diagnosis not present

## 2015-03-28 DIAGNOSIS — D509 Iron deficiency anemia, unspecified: Secondary | ICD-10-CM | POA: Diagnosis not present

## 2015-03-28 DIAGNOSIS — D631 Anemia in chronic kidney disease: Secondary | ICD-10-CM | POA: Diagnosis not present

## 2015-03-30 DIAGNOSIS — N2581 Secondary hyperparathyroidism of renal origin: Secondary | ICD-10-CM | POA: Diagnosis not present

## 2015-03-30 DIAGNOSIS — D509 Iron deficiency anemia, unspecified: Secondary | ICD-10-CM | POA: Diagnosis not present

## 2015-03-30 DIAGNOSIS — D631 Anemia in chronic kidney disease: Secondary | ICD-10-CM | POA: Diagnosis not present

## 2015-03-30 DIAGNOSIS — N186 End stage renal disease: Secondary | ICD-10-CM | POA: Diagnosis not present

## 2015-04-02 DIAGNOSIS — D631 Anemia in chronic kidney disease: Secondary | ICD-10-CM | POA: Diagnosis not present

## 2015-04-02 DIAGNOSIS — N2581 Secondary hyperparathyroidism of renal origin: Secondary | ICD-10-CM | POA: Diagnosis not present

## 2015-04-02 DIAGNOSIS — D509 Iron deficiency anemia, unspecified: Secondary | ICD-10-CM | POA: Diagnosis not present

## 2015-04-02 DIAGNOSIS — N186 End stage renal disease: Secondary | ICD-10-CM | POA: Diagnosis not present

## 2015-04-04 DIAGNOSIS — N2581 Secondary hyperparathyroidism of renal origin: Secondary | ICD-10-CM | POA: Diagnosis not present

## 2015-04-04 DIAGNOSIS — D509 Iron deficiency anemia, unspecified: Secondary | ICD-10-CM | POA: Diagnosis not present

## 2015-04-04 DIAGNOSIS — D631 Anemia in chronic kidney disease: Secondary | ICD-10-CM | POA: Diagnosis not present

## 2015-04-04 DIAGNOSIS — N186 End stage renal disease: Secondary | ICD-10-CM | POA: Diagnosis not present

## 2015-04-06 DIAGNOSIS — D631 Anemia in chronic kidney disease: Secondary | ICD-10-CM | POA: Diagnosis not present

## 2015-04-06 DIAGNOSIS — N2581 Secondary hyperparathyroidism of renal origin: Secondary | ICD-10-CM | POA: Diagnosis not present

## 2015-04-06 DIAGNOSIS — N186 End stage renal disease: Secondary | ICD-10-CM | POA: Diagnosis not present

## 2015-04-06 DIAGNOSIS — D509 Iron deficiency anemia, unspecified: Secondary | ICD-10-CM | POA: Diagnosis not present

## 2015-04-09 DIAGNOSIS — N186 End stage renal disease: Secondary | ICD-10-CM | POA: Diagnosis not present

## 2015-04-09 DIAGNOSIS — D631 Anemia in chronic kidney disease: Secondary | ICD-10-CM | POA: Diagnosis not present

## 2015-04-09 DIAGNOSIS — D509 Iron deficiency anemia, unspecified: Secondary | ICD-10-CM | POA: Diagnosis not present

## 2015-04-09 DIAGNOSIS — N2581 Secondary hyperparathyroidism of renal origin: Secondary | ICD-10-CM | POA: Diagnosis not present

## 2015-04-11 DIAGNOSIS — D509 Iron deficiency anemia, unspecified: Secondary | ICD-10-CM | POA: Diagnosis not present

## 2015-04-11 DIAGNOSIS — D631 Anemia in chronic kidney disease: Secondary | ICD-10-CM | POA: Diagnosis not present

## 2015-04-11 DIAGNOSIS — N186 End stage renal disease: Secondary | ICD-10-CM | POA: Diagnosis not present

## 2015-04-11 DIAGNOSIS — N2581 Secondary hyperparathyroidism of renal origin: Secondary | ICD-10-CM | POA: Diagnosis not present

## 2015-04-13 DIAGNOSIS — D631 Anemia in chronic kidney disease: Secondary | ICD-10-CM | POA: Diagnosis not present

## 2015-04-13 DIAGNOSIS — N2581 Secondary hyperparathyroidism of renal origin: Secondary | ICD-10-CM | POA: Diagnosis not present

## 2015-04-13 DIAGNOSIS — T8612 Kidney transplant failure: Secondary | ICD-10-CM | POA: Diagnosis not present

## 2015-04-13 DIAGNOSIS — D509 Iron deficiency anemia, unspecified: Secondary | ICD-10-CM | POA: Diagnosis not present

## 2015-04-13 DIAGNOSIS — N186 End stage renal disease: Secondary | ICD-10-CM | POA: Diagnosis not present

## 2015-04-13 DIAGNOSIS — Z992 Dependence on renal dialysis: Secondary | ICD-10-CM | POA: Diagnosis not present

## 2015-04-16 DIAGNOSIS — D631 Anemia in chronic kidney disease: Secondary | ICD-10-CM | POA: Diagnosis not present

## 2015-04-16 DIAGNOSIS — N186 End stage renal disease: Secondary | ICD-10-CM | POA: Diagnosis not present

## 2015-04-16 DIAGNOSIS — N2581 Secondary hyperparathyroidism of renal origin: Secondary | ICD-10-CM | POA: Diagnosis not present

## 2015-04-16 DIAGNOSIS — D509 Iron deficiency anemia, unspecified: Secondary | ICD-10-CM | POA: Diagnosis not present

## 2015-04-17 DIAGNOSIS — S52501D Unspecified fracture of the lower end of right radius, subsequent encounter for closed fracture with routine healing: Secondary | ICD-10-CM | POA: Diagnosis not present

## 2015-04-18 DIAGNOSIS — N186 End stage renal disease: Secondary | ICD-10-CM | POA: Diagnosis not present

## 2015-04-18 DIAGNOSIS — N2581 Secondary hyperparathyroidism of renal origin: Secondary | ICD-10-CM | POA: Diagnosis not present

## 2015-04-18 DIAGNOSIS — D631 Anemia in chronic kidney disease: Secondary | ICD-10-CM | POA: Diagnosis not present

## 2015-04-18 DIAGNOSIS — D509 Iron deficiency anemia, unspecified: Secondary | ICD-10-CM | POA: Diagnosis not present

## 2015-04-20 DIAGNOSIS — D631 Anemia in chronic kidney disease: Secondary | ICD-10-CM | POA: Diagnosis not present

## 2015-04-20 DIAGNOSIS — D509 Iron deficiency anemia, unspecified: Secondary | ICD-10-CM | POA: Diagnosis not present

## 2015-04-20 DIAGNOSIS — N186 End stage renal disease: Secondary | ICD-10-CM | POA: Diagnosis not present

## 2015-04-20 DIAGNOSIS — N2581 Secondary hyperparathyroidism of renal origin: Secondary | ICD-10-CM | POA: Diagnosis not present

## 2015-04-23 DIAGNOSIS — N2581 Secondary hyperparathyroidism of renal origin: Secondary | ICD-10-CM | POA: Diagnosis not present

## 2015-04-23 DIAGNOSIS — D509 Iron deficiency anemia, unspecified: Secondary | ICD-10-CM | POA: Diagnosis not present

## 2015-04-23 DIAGNOSIS — D631 Anemia in chronic kidney disease: Secondary | ICD-10-CM | POA: Diagnosis not present

## 2015-04-23 DIAGNOSIS — N186 End stage renal disease: Secondary | ICD-10-CM | POA: Diagnosis not present

## 2015-04-25 DIAGNOSIS — D509 Iron deficiency anemia, unspecified: Secondary | ICD-10-CM | POA: Diagnosis not present

## 2015-04-25 DIAGNOSIS — N2581 Secondary hyperparathyroidism of renal origin: Secondary | ICD-10-CM | POA: Diagnosis not present

## 2015-04-25 DIAGNOSIS — N186 End stage renal disease: Secondary | ICD-10-CM | POA: Diagnosis not present

## 2015-04-25 DIAGNOSIS — D631 Anemia in chronic kidney disease: Secondary | ICD-10-CM | POA: Diagnosis not present

## 2015-04-27 DIAGNOSIS — D509 Iron deficiency anemia, unspecified: Secondary | ICD-10-CM | POA: Diagnosis not present

## 2015-04-27 DIAGNOSIS — N186 End stage renal disease: Secondary | ICD-10-CM | POA: Diagnosis not present

## 2015-04-27 DIAGNOSIS — N2581 Secondary hyperparathyroidism of renal origin: Secondary | ICD-10-CM | POA: Diagnosis not present

## 2015-04-27 DIAGNOSIS — D631 Anemia in chronic kidney disease: Secondary | ICD-10-CM | POA: Diagnosis not present

## 2015-04-30 DIAGNOSIS — N2581 Secondary hyperparathyroidism of renal origin: Secondary | ICD-10-CM | POA: Diagnosis not present

## 2015-04-30 DIAGNOSIS — N186 End stage renal disease: Secondary | ICD-10-CM | POA: Diagnosis not present

## 2015-04-30 DIAGNOSIS — D631 Anemia in chronic kidney disease: Secondary | ICD-10-CM | POA: Diagnosis not present

## 2015-04-30 DIAGNOSIS — D509 Iron deficiency anemia, unspecified: Secondary | ICD-10-CM | POA: Diagnosis not present

## 2015-05-02 DIAGNOSIS — D631 Anemia in chronic kidney disease: Secondary | ICD-10-CM | POA: Diagnosis not present

## 2015-05-02 DIAGNOSIS — D509 Iron deficiency anemia, unspecified: Secondary | ICD-10-CM | POA: Diagnosis not present

## 2015-05-02 DIAGNOSIS — N2581 Secondary hyperparathyroidism of renal origin: Secondary | ICD-10-CM | POA: Diagnosis not present

## 2015-05-02 DIAGNOSIS — N186 End stage renal disease: Secondary | ICD-10-CM | POA: Diagnosis not present

## 2015-05-04 DIAGNOSIS — D631 Anemia in chronic kidney disease: Secondary | ICD-10-CM | POA: Diagnosis not present

## 2015-05-04 DIAGNOSIS — D509 Iron deficiency anemia, unspecified: Secondary | ICD-10-CM | POA: Diagnosis not present

## 2015-05-04 DIAGNOSIS — N186 End stage renal disease: Secondary | ICD-10-CM | POA: Diagnosis not present

## 2015-05-04 DIAGNOSIS — N2581 Secondary hyperparathyroidism of renal origin: Secondary | ICD-10-CM | POA: Diagnosis not present

## 2015-05-07 DIAGNOSIS — N186 End stage renal disease: Secondary | ICD-10-CM | POA: Diagnosis not present

## 2015-05-07 DIAGNOSIS — N2581 Secondary hyperparathyroidism of renal origin: Secondary | ICD-10-CM | POA: Diagnosis not present

## 2015-05-07 DIAGNOSIS — D509 Iron deficiency anemia, unspecified: Secondary | ICD-10-CM | POA: Diagnosis not present

## 2015-05-07 DIAGNOSIS — D631 Anemia in chronic kidney disease: Secondary | ICD-10-CM | POA: Diagnosis not present

## 2015-05-08 ENCOUNTER — Observation Stay (HOSPITAL_COMMUNITY)
Admission: EM | Admit: 2015-05-08 | Discharge: 2015-05-10 | Disposition: A | Discharge status: Home / Self Care | Payer: Medicare Other | Attending: Family Medicine | Admitting: Family Medicine

## 2015-05-08 ENCOUNTER — Encounter (HOSPITAL_COMMUNITY): Payer: Self-pay | Admitting: Emergency Medicine

## 2015-05-08 DIAGNOSIS — E785 Hyperlipidemia, unspecified: Secondary | ICD-10-CM | POA: Diagnosis not present

## 2015-05-08 DIAGNOSIS — I120 Hypertensive chronic kidney disease with stage 5 chronic kidney disease or end stage renal disease: Secondary | ICD-10-CM | POA: Diagnosis not present

## 2015-05-08 DIAGNOSIS — F419 Anxiety disorder, unspecified: Secondary | ICD-10-CM | POA: Diagnosis not present

## 2015-05-08 DIAGNOSIS — R531 Weakness: Secondary | ICD-10-CM | POA: Diagnosis not present

## 2015-05-08 DIAGNOSIS — Z131 Encounter for screening for diabetes mellitus: Secondary | ICD-10-CM | POA: Diagnosis not present

## 2015-05-08 DIAGNOSIS — Z833 Family history of diabetes mellitus: Secondary | ICD-10-CM | POA: Diagnosis not present

## 2015-05-08 DIAGNOSIS — R270 Ataxia, unspecified: Secondary | ICD-10-CM | POA: Insufficient documentation

## 2015-05-08 DIAGNOSIS — F329 Major depressive disorder, single episode, unspecified: Secondary | ICD-10-CM | POA: Diagnosis not present

## 2015-05-08 DIAGNOSIS — R42 Dizziness and giddiness: Secondary | ICD-10-CM | POA: Diagnosis not present

## 2015-05-08 DIAGNOSIS — Z882 Allergy status to sulfonamides status: Secondary | ICD-10-CM | POA: Insufficient documentation

## 2015-05-08 DIAGNOSIS — R2689 Other abnormalities of gait and mobility: Secondary | ICD-10-CM | POA: Insufficient documentation

## 2015-05-08 DIAGNOSIS — R200 Anesthesia of skin: Principal | ICD-10-CM | POA: Insufficient documentation

## 2015-05-08 DIAGNOSIS — K219 Gastro-esophageal reflux disease without esophagitis: Secondary | ICD-10-CM | POA: Diagnosis not present

## 2015-05-08 DIAGNOSIS — R7309 Other abnormal glucose: Secondary | ICD-10-CM | POA: Diagnosis not present

## 2015-05-08 DIAGNOSIS — Z992 Dependence on renal dialysis: Secondary | ICD-10-CM | POA: Diagnosis not present

## 2015-05-08 DIAGNOSIS — N189 Chronic kidney disease, unspecified: Secondary | ICD-10-CM

## 2015-05-08 DIAGNOSIS — I10 Essential (primary) hypertension: Secondary | ICD-10-CM | POA: Diagnosis present

## 2015-05-08 DIAGNOSIS — Z940 Kidney transplant status: Secondary | ICD-10-CM | POA: Insufficient documentation

## 2015-05-08 DIAGNOSIS — D631 Anemia in chronic kidney disease: Secondary | ICD-10-CM | POA: Diagnosis not present

## 2015-05-08 DIAGNOSIS — I5033 Acute on chronic diastolic (congestive) heart failure: Secondary | ICD-10-CM

## 2015-05-08 DIAGNOSIS — I6523 Occlusion and stenosis of bilateral carotid arteries: Secondary | ICD-10-CM | POA: Insufficient documentation

## 2015-05-08 DIAGNOSIS — R110 Nausea: Secondary | ICD-10-CM | POA: Diagnosis not present

## 2015-05-08 DIAGNOSIS — Z8673 Personal history of transient ischemic attack (TIA), and cerebral infarction without residual deficits: Secondary | ICD-10-CM | POA: Diagnosis not present

## 2015-05-08 DIAGNOSIS — N186 End stage renal disease: Secondary | ICD-10-CM | POA: Diagnosis not present

## 2015-05-08 DIAGNOSIS — G459 Transient cerebral ischemic attack, unspecified: Secondary | ICD-10-CM

## 2015-05-08 DIAGNOSIS — R202 Paresthesia of skin: Secondary | ICD-10-CM

## 2015-05-08 DIAGNOSIS — I509 Heart failure, unspecified: Secondary | ICD-10-CM | POA: Diagnosis not present

## 2015-05-08 LAB — COMPREHENSIVE METABOLIC PANEL
ALT: 11 U/L — AB (ref 14–54)
AST: 21 U/L (ref 15–41)
Albumin: 3.4 g/dL — ABNORMAL LOW (ref 3.5–5.0)
Alkaline Phosphatase: 94 U/L (ref 38–126)
Anion gap: 19 — ABNORMAL HIGH (ref 5–15)
BILIRUBIN TOTAL: 0.4 mg/dL (ref 0.3–1.2)
BUN: 38 mg/dL — AB (ref 6–20)
CALCIUM: 9.5 mg/dL (ref 8.9–10.3)
CHLORIDE: 93 mmol/L — AB (ref 101–111)
CO2: 27 mmol/L (ref 22–32)
Creatinine, Ser: 6.75 mg/dL — ABNORMAL HIGH (ref 0.44–1.00)
GFR calc Af Amer: 7 mL/min — ABNORMAL LOW (ref >60)
GFR calc non Af Amer: 6 mL/min — ABNORMAL LOW (ref >60)
Glucose, Bld: 152 mg/dL — ABNORMAL HIGH (ref 65–99)
POTASSIUM: 4.2 mmol/L (ref 3.5–5.1)
SODIUM: 139 mmol/L (ref 135–145)
Total Protein: 8 g/dL (ref 6.5–8.1)

## 2015-05-08 LAB — CBG MONITORING, ED: Glucose-Capillary: 146 mg/dL — ABNORMAL HIGH (ref 65–99)

## 2015-05-08 LAB — CBC WITH DIFFERENTIAL/PLATELET
Basophils Absolute: 0 10*3/uL (ref 0.0–0.1)
Basophils Relative: 0 % (ref 0–1)
EOS PCT: 2 % (ref 0–5)
Eosinophils Absolute: 0.1 10*3/uL (ref 0.0–0.7)
HEMATOCRIT: 35.2 % — AB (ref 36.0–46.0)
Hemoglobin: 11.1 g/dL — ABNORMAL LOW (ref 12.0–15.0)
LYMPHS PCT: 33 % (ref 12–46)
Lymphs Abs: 2.1 10*3/uL (ref 0.7–4.0)
MCH: 29.3 pg (ref 26.0–34.0)
MCHC: 31.5 g/dL (ref 30.0–36.0)
MCV: 92.9 fL (ref 78.0–100.0)
MONOS PCT: 11 % (ref 3–12)
Monocytes Absolute: 0.7 10*3/uL (ref 0.1–1.0)
Neutro Abs: 3.5 10*3/uL (ref 1.7–7.7)
Neutrophils Relative %: 54 % (ref 43–77)
PLATELETS: 255 10*3/uL (ref 150–400)
RBC: 3.79 MIL/uL — ABNORMAL LOW (ref 3.87–5.11)
RDW: 18.1 % — ABNORMAL HIGH (ref 11.5–15.5)
WBC: 6.5 10*3/uL (ref 4.0–10.5)

## 2015-05-08 NOTE — ED Notes (Signed)
Pt. reports dizziness and diaphoresis onset today , hemodialysis q Tues/Thurs/Fri , alert and oriented , no neuro deficits at arrival . Denies nausea or fever .

## 2015-05-09 ENCOUNTER — Observation Stay (HOSPITAL_COMMUNITY): Payer: Medicare Other

## 2015-05-09 ENCOUNTER — Emergency Department (HOSPITAL_COMMUNITY): Payer: Medicare Other

## 2015-05-09 ENCOUNTER — Encounter (HOSPITAL_COMMUNITY): Payer: Self-pay

## 2015-05-09 DIAGNOSIS — R200 Anesthesia of skin: Secondary | ICD-10-CM | POA: Diagnosis not present

## 2015-05-09 DIAGNOSIS — R208 Other disturbances of skin sensation: Secondary | ICD-10-CM

## 2015-05-09 DIAGNOSIS — N186 End stage renal disease: Secondary | ICD-10-CM | POA: Diagnosis not present

## 2015-05-09 DIAGNOSIS — R42 Dizziness and giddiness: Secondary | ICD-10-CM | POA: Diagnosis not present

## 2015-05-09 DIAGNOSIS — I509 Heart failure, unspecified: Secondary | ICD-10-CM | POA: Insufficient documentation

## 2015-05-09 DIAGNOSIS — D509 Iron deficiency anemia, unspecified: Secondary | ICD-10-CM | POA: Diagnosis not present

## 2015-05-09 DIAGNOSIS — I120 Hypertensive chronic kidney disease with stage 5 chronic kidney disease or end stage renal disease: Secondary | ICD-10-CM | POA: Diagnosis not present

## 2015-05-09 DIAGNOSIS — R270 Ataxia, unspecified: Secondary | ICD-10-CM | POA: Diagnosis not present

## 2015-05-09 DIAGNOSIS — I5033 Acute on chronic diastolic (congestive) heart failure: Secondary | ICD-10-CM

## 2015-05-09 DIAGNOSIS — G459 Transient cerebral ischemic attack, unspecified: Secondary | ICD-10-CM | POA: Diagnosis not present

## 2015-05-09 DIAGNOSIS — D631 Anemia in chronic kidney disease: Secondary | ICD-10-CM | POA: Diagnosis not present

## 2015-05-09 DIAGNOSIS — I10 Essential (primary) hypertension: Secondary | ICD-10-CM

## 2015-05-09 DIAGNOSIS — Z8673 Personal history of transient ischemic attack (TIA), and cerebral infarction without residual deficits: Secondary | ICD-10-CM

## 2015-05-09 DIAGNOSIS — E785 Hyperlipidemia, unspecified: Secondary | ICD-10-CM | POA: Diagnosis present

## 2015-05-09 DIAGNOSIS — N189 Chronic kidney disease, unspecified: Secondary | ICD-10-CM | POA: Diagnosis not present

## 2015-05-09 DIAGNOSIS — N2581 Secondary hyperparathyroidism of renal origin: Secondary | ICD-10-CM | POA: Diagnosis not present

## 2015-05-09 DIAGNOSIS — Z992 Dependence on renal dialysis: Secondary | ICD-10-CM | POA: Diagnosis not present

## 2015-05-09 LAB — LIPID PANEL
Cholesterol: 158 mg/dL (ref 0–200)
HDL: 49 mg/dL (ref >40)
LDL CALC: 92 mg/dL (ref 0–99)
Total CHOL/HDL Ratio: 3.2 RATIO
Triglycerides: 86 mg/dL (ref <150)
VLDL: 17 mg/dL (ref 0–40)

## 2015-05-09 LAB — SURGICAL PCR SCREEN
MRSA, PCR: NEGATIVE
STAPHYLOCOCCUS AUREUS: NEGATIVE

## 2015-05-09 LAB — I-STAT TROPONIN, ED: Troponin i, poc: 0.03 ng/mL (ref 0.00–0.08)

## 2015-05-09 MED ORDER — SIMVASTATIN 10 MG PO TABS
10.0000 mg | ORAL_TABLET | Freq: Every day | ORAL | Status: DC
  Administered 2015-05-09: 10 mg via ORAL
  Filled 2015-05-09 (×2): qty 1

## 2015-05-09 MED ORDER — LABETALOL HCL 300 MG PO TABS
300.0000 mg | ORAL_TABLET | Freq: Two times a day (BID) | ORAL | Status: DC
  Administered 2015-05-09 – 2015-05-10 (×2): 300 mg via ORAL
  Filled 2015-05-09 (×4): qty 1

## 2015-05-09 MED ORDER — CALCITRIOL 0.5 MCG PO CAPS
1.0000 ug | ORAL_CAPSULE | ORAL | Status: DC
  Administered 2015-05-09: 1 ug via ORAL
  Filled 2015-05-09: qty 2

## 2015-05-09 MED ORDER — LIDOCAINE-PRILOCAINE 2.5-2.5 % EX CREA: 1.0000 "application " | TOPICAL_CREAM | CUTANEOUS | Status: DC | PRN

## 2015-05-09 MED ORDER — STROKE: EARLY STAGES OF RECOVERY BOOK
Freq: Once | Status: AC
  Administered 2015-05-09: 04:00:00
  Filled 2015-05-09: qty 1

## 2015-05-09 MED ORDER — CALCIUM CARBONATE ANTACID 500 MG PO CHEW
4.0000 | CHEWABLE_TABLET | Freq: Three times a day (TID) | ORAL | Status: DC
  Administered 2015-05-09 – 2015-05-10 (×3): 800 mg via ORAL
  Filled 2015-05-09 (×7): qty 4

## 2015-05-09 MED ORDER — LIDOCAINE HCL (PF) 1 % IJ SOLN: 5.0000 mL | INTRAMUSCULAR | Status: DC | PRN

## 2015-05-09 MED ORDER — ASPIRIN 325 MG PO TABS
325.0000 mg | ORAL_TABLET | Freq: Every day | ORAL | Status: DC
  Administered 2015-05-09 – 2015-05-10 (×2): 325 mg via ORAL
  Filled 2015-05-09 (×2): qty 1

## 2015-05-09 MED ORDER — CINACALCET HCL 30 MG PO TABS
30.0000 mg | ORAL_TABLET | Freq: Every day | ORAL | Status: DC
  Filled 2015-05-09 (×2): qty 1

## 2015-05-09 MED ORDER — SODIUM CHLORIDE 0.9 % IV SOLN
100.0000 mL | INTRAVENOUS | Status: DC | PRN
Start: 2015-05-09 — End: 2015-05-09

## 2015-05-09 MED ORDER — SODIUM CHLORIDE 0.9 % IV BOLUS (SEPSIS)
500.0000 mL | Freq: Once | INTRAVENOUS | Status: AC
  Administered 2015-05-09: 500 mL via INTRAVENOUS

## 2015-05-09 MED ORDER — SODIUM CHLORIDE 0.9 % IJ SOLN: 3.0000 mL | INTRAMUSCULAR | Status: DC | PRN

## 2015-05-09 MED ORDER — RENA-VITE PO TABS
1.0000 | ORAL_TABLET | Freq: Every day | ORAL | Status: DC
  Administered 2015-05-09: 1 via ORAL
  Filled 2015-05-09 (×2): qty 1

## 2015-05-09 MED ORDER — PENTAFLUOROPROP-TETRAFLUOROETH EX AERO: 1.0000 "application " | INHALATION_SPRAY | CUTANEOUS | Status: DC | PRN

## 2015-05-09 MED ORDER — SODIUM CHLORIDE 0.9 % IV SOLN: 250.0000 mL | INTRAVENOUS | Status: DC | PRN

## 2015-05-09 MED ORDER — LOSARTAN POTASSIUM 50 MG PO TABS
100.0000 mg | ORAL_TABLET | Freq: Every day | ORAL | Status: DC
  Administered 2015-05-09: 100 mg via ORAL
  Filled 2015-05-09 (×2): qty 2

## 2015-05-09 MED ORDER — SODIUM CHLORIDE 0.9 % IV SOLN: 100.0000 mL | INTRAVENOUS | Status: DC | PRN

## 2015-05-09 MED ORDER — ALTEPLASE 2 MG IJ SOLR: 2.0000 mg | Freq: Once | INTRAMUSCULAR | Status: DC | PRN

## 2015-05-09 MED ORDER — NEPRO/CARBSTEADY PO LIQD
237.0000 mL | ORAL | Status: DC | PRN
Start: 2015-05-09 — End: 2015-05-09

## 2015-05-09 MED ORDER — DOXAZOSIN MESYLATE 8 MG PO TABS
8.0000 mg | ORAL_TABLET | Freq: Two times a day (BID) | ORAL | Status: DC
  Administered 2015-05-09 – 2015-05-10 (×2): 8 mg via ORAL
  Filled 2015-05-09 (×4): qty 1

## 2015-05-09 MED ORDER — MECLIZINE HCL 25 MG PO TABS
25.0000 mg | ORAL_TABLET | Freq: Once | ORAL | Status: AC
  Administered 2015-05-09: 25 mg via ORAL
  Filled 2015-05-09: qty 1

## 2015-05-09 MED ORDER — ASPIRIN 300 MG RE SUPP
300.0000 mg | Freq: Every day | RECTAL | Status: DC
  Filled 2015-05-09 (×2): qty 1

## 2015-05-09 MED ORDER — SODIUM CHLORIDE 0.9 % IJ SOLN
3.0000 mL | Freq: Two times a day (BID) | INTRAMUSCULAR | Status: DC
  Administered 2015-05-09 – 2015-05-10 (×3): 3 mL via INTRAVENOUS

## 2015-05-09 NOTE — Evaluation (Signed)
Speech Language Pathology Evaluation Patient Details Name: [name] MRN: [id number] DOB: November 04, 1951 Today's Date: 05/09/2015 Time: [id number] SLP Time Calculation (min) (ACUTE ONLY): 20 min  Problem List:  Patient Active Problem List   Diagnosis Date Noted  . Dizziness 05/09/2015  . Ataxia 05/09/2015  . H/O: CVA (cerebrovascular accident) 05/09/2015  . Left facial numbness 05/09/2015  . Left leg numbness 05/09/2015  . Hyperlipidemia   . CHF (congestive heart failure)   . Congestive heart disease   . Shortness of breath 04/01/2013  . Volume overload 04/01/2013  . HTN (hypertension) 04/01/2013  . Cholecystitis, acute 11/07/2012  . ESRD (end stage renal disease) 11/07/2012  . GERD (gastroesophageal reflux disease) 11/07/2012  . Anemia in CKD (chronic kidney disease) 11/07/2012   Past Medical History:  Past Medical History  Diagnosis Date  . Anxiety   . Blood transfusion   . Depression   . GERD (gastroesophageal reflux disease)   . Hyperlipidemia   . Hypertension   . Neuromuscular disorder     neuropathy hand and legs  . Weight loss, unintentional   . Adenomatous polyp of colon 10/2010  . Diverticula, colon   . CHF (congestive heart failure)   . 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  Had deceased donor kidney transplant in 1996.  Had some early rejection then stable function for years, then had slow decline of function and went back on hemodialysis in 2012.  Gets HD TTS schedule at [facility] on [facility] still using L forearm AVF.     Marland Kitchen Anemia in CKD (chronic kidney disease) 11/07/2012  . Arthritis   . Osteoporosis    Past Surgical History:  Past Surgical History  Procedure Laterality Date  . Kidney transplant  1996  . Back surgery    . Cervical fusion    . Av fistula placement      for dialysis  . Cholecystectomy  12/02/2012    Procedure: LAPAROSCOPIC CHOLECYSTECTOMY WITH  INTRAOPERATIVE CHOLANGIOGRAM;  Surgeon: Edward Jolly, MD;  Location: [facility] OR;  Service: General;  Laterality: N/A;   HPI:  64 yo female h/o ESRD, HTN, CHF, anemia comes in with several days of left facial numbness along with numbness and tingling in her leg arm and leg. 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 Problem solving for hypothetical functional situations decreased. ST recommends continued cogntive therapy (may discharge tomorrow) in hospital and home health.      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)  Cognition  Overall Cognitive Status: Impaired/Different from baseline Arousal/Alertness: Awake/alert Orientation Level: Oriented X4 Attention: Sustained Sustained Attention: Appears intact Memory: Impaired Memory Impairment: Retrieval deficit (mild-moderate) Awareness: Appears intact Problem Solving: Impaired Problem Solving Impairment: Verbal  complex;Functional complex Executive Function: Organizing;Sequencing;Decision Making Safety/Judgment: Appears intact    Comprehension  Auditory Comprehension Overall Auditory Comprehension: Appears within functional limits for tasks assessed Interfering Components: Working Curator: Not tested Reading Comprehension Reading Status:  (need to assess)    Expression Expression Primary Mode of Expression: Verbal Verbal Expression Overall Verbal Expression: Appears within functional limits for tasks assessed Pragmatics: No impairment Written Expression Dominant Hand: Right Written Expression:  (will assess)   Oral / Motor Oral Motor/Sensory Function Overall Oral Motor/Sensory Function: Impaired Labial ROM: Reduced left Labial Symmetry: Abnormal symmetry left Labial Strength: Reduced Labial Sensation: Within Functional Limits Lingual ROM: Within Functional Limits Lingual Symmetry: Within Functional Limits Lingual Strength: Within Functional Limits Lingual Sensation: Within Functional Limits Facial ROM: Within Functional Limits Facial Symmetry: Within Functional Limits Facial Strength: Within Functional Limits Facial Sensation: Within Functional Limits Velum: Within Functional Limits Mandible: Within Functional Limits Motor Speech Overall Motor Speech: Appears within functional limits for tasks assessed Respiration: Within functional limits Phonation: Normal Resonance: Within functional limits Articulation: Within functional limitis Intelligibility: Intelligible Motor Planning: Witnin functional limits   GO Functional Assessment Tool Used: skilled clinical judgement Functional Limitations: Memory Memory Current Status [id number]): At least 20 percent but less than 40 percent impaired, limited or restricted Memory Goal Status [id number]): At least 1 percent but less than 20 percent impaired, limited or restricted   Houston Siren 05/09/2015, 2:15 PM   Orbie Pyo Colvin Caroli.Ed [employer] [phone]

## 2015-05-09 NOTE — Progress Notes (Signed)
Subjective: Patient no longer feeling dizzy.  She feels the numbness is improving in her face. Still having numbness in her leg but this is chronic.   Objective: Current vital signs: BP 159/90 mmHg  Pulse 74  Temp(Src) 98 F (36.7 C) (Oral)  Resp 15  Ht 5\' 2"  (1.575 m)  Wt 55.974 kg (123 lb 6.4 oz)  BMI 22.56 kg/m2  SpO2 99% Vital signs in last 24 hours: Temp:  [97.9 F (36.6 C)-98.2 F (36.8 C)] 98 F (36.7 C) (05/26 0747) Pulse Rate:  [70-77] 74 (05/26 0403) Resp:  [11-21] 15 (05/26 0747) BP: (159-187)/(85-95) 159/90 mmHg (05/26 0747) SpO2:  [99 %-100 %] 99 % (05/26 0403) Weight:  [55.974 kg (123 lb 6.4 oz)] 55.974 kg (123 lb 6.4 oz) (05/26 0357)  Intake/Output from previous day:   Intake/Output this shift: Total I/O In: 240 [P.O.:240] Out: -  Nutritional status: Diet renal with fluid restriction Room service appropriate?: Yes; Fluid consistency:: Thin  Neurologic Exam: General: Mental Status: Alert, oriented, thought content appropriate.  Speech fluent without evidence of aphasia.  Able to follow 3 step commands without difficulty. Cranial Nerves: II: Discs flat bilaterally; Visual fields grossly normal, pupils equal, round, reactive to light and accommodation III,IV, VI: ptosis not present, extra-ocular motions intact bilaterally V,VII: smile asymmetric on the left, facial light touch sensation normal bilaterally VIII: hearing normal bilaterally IX,X: uvula rises symmetrically XI: bilateral shoulder shrug XII: midline tongue extension without atrophy or fasciculations  Motor: Right : Upper extremity   5/5    Left:     Upper extremity   5/5  Lower extremity   5/5     Lower extremity   5/5 Tone and bulk:normal tone throughout; no atrophy noted Sensory: Pinprick and light touch decreased on her left leg Deep Tendon Reflexes:  Right: Upper Extremity   Left: Upper extremity   biceps (C-5 to C-6) 2/4   biceps (C-5 to C-6) 2/4 tricep (C7) 2/4    triceps (C7)  2/4 Brachioradialis (C6) 2/4  Brachioradialis (C6) 2/4  Lower Extremity Lower Extremity  quadriceps (L-2 to L-4) 2/4   quadriceps (L-2 to L-4) 1/4 Achilles (S1) 0/4   Achilles (S1) 0/4  Plantars: Mute bilaterally    Lab Results: Basic Metabolic Panel:  Recent Labs Lab 05/08/15 1924  NA 139  K 4.2  CL 93*  CO2 27  GLUCOSE 152*  BUN 38*  CREATININE 6.75*  CALCIUM 9.5    Liver Function Tests:  Recent Labs Lab 05/08/15 1924  AST 21  ALT 11*  ALKPHOS 94  BILITOT 0.4  PROT 8.0  ALBUMIN 3.4*   No results for input(s): LIPASE, AMYLASE in the last 168 hours. No results for input(s): AMMONIA in the last 168 hours.  CBC:  Recent Labs Lab 05/08/15 1924  WBC 6.5  NEUTROABS 3.5  HGB 11.1*  HCT 35.2*  MCV 92.9  PLT 255    Cardiac Enzymes: No results for input(s): CKTOTAL, CKMB, CKMBINDEX, TROPONINI in the last 168 hours.  Lipid Panel:  Recent Labs Lab 05/09/15 0455  CHOL 158  TRIG 86  HDL 49  CHOLHDL 3.2  VLDL 17  LDLCALC 92    CBG:  Recent Labs Lab 05/08/15 1914  GLUCAP 146*    Microbiology: Results for orders placed or performed during the hospital encounter of 07/31/14  Blood culture (routine x 2)     Status: None   Collection Time: 07/31/14  5:50 AM  Result Value Ref Range Status   Specimen  Description BLOOD RIGHT ANTECUBITAL  Final   Special Requests BOTTLES DRAWN AEROBIC AND ANAEROBIC 10CC EA  Final   Culture  Setup Time   Final    07/31/2014 10:30 Performed at [facility]   Culture   Final    NO GROWTH 5 DAYS Performed at [facility]   Report Status 08/06/2014 FINAL  Final  Blood culture (routine x 2)     Status: None   Collection Time: 07/31/14  5:55 AM  Result Value Ref Range Status   Specimen Description BLOOD RIGHT HAND  Final   Special Requests BOTTLES DRAWN AEROBIC AND ANAEROBIC 5CC EA  Final   Culture  Setup Time   Final    07/31/2014 10:30 Performed at [facility]   Culture   Final    NO  GROWTH 5 DAYS Performed at [facility]   Report Status 08/06/2014 FINAL  Final    Coagulation Studies: No results for input(s): LABPROT, INR in the last 72 hours.  Imaging: Ct Head Wo Contrast  05/09/2015   CLINICAL DATA:  Dizziness and diaphoresis onset today. Left-sided paresthesias  EXAM: CT HEAD WITHOUT CONTRAST  TECHNIQUE: Contiguous axial images were obtained from the base of the skull through the vertex without intravenous contrast.  COMPARISON:  07/28/2014  FINDINGS: Skull and Sinuses:Stable widening of the predental space with moderate canal stenosis. This is new from 2006 cervical spine MRI. No acute fracture or destructive process. No sinusitis.  Orbits: Bilateral cataract resection.  No acute findings.  Brain: No evidence of acute infarction, hemorrhage, hydrocephalus, or mass lesion/mass effect.  There is chronic small vessel disease with patchy ischemic gliosis in the bilateral cerebral white matter. There is a new but chronic appearing lacunar infarct in the right corona radiata. A small ischemic insult in the left thalamus is chronic.  IMPRESSION: 1. No acute findings. 2. Chronic small vessel disease. A lacunar infarct in the right corona radiata has developed since 07/28/2014 but appears chronic. 3. 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 If there is chronic neck pain this could be evaluated with outpatient cervical spine imaging   Electronically Signed   By: Monte Fantasia M.D.   On: 05/09/2015 01:27   Mr Brain Wo Contrast  05/09/2015   CLINICAL DATA:  TIA. Left facial numbness. Left arm and leg numbness and tingling. Dizziness. Leaning to left.  EXAM: MRI HEAD WITHOUT CONTRAST  MRA HEAD WITHOUT CONTRAST  TECHNIQUE: Multiplanar, multiecho pulse sequences of the brain and surrounding structures were obtained without intravenous contrast. Angiographic images of the head were obtained using MRA technique without contrast.   COMPARISON:  CT head 05/09/2015  FINDINGS: MRI HEAD FINDINGS  Negative for acute infarct.  Ventricle size normal. Mild age-appropriate atrophy. Chronic microvascular ischemic change in the white matter bilaterally. No cortical infarct.  Pituitary normal in size. Cervical medullary junction normal. Mild mucosal edema paranasal sinuses.  Multiple areas of micro hemorrhage in the brain including the thalamus bilaterally. Chronic hemorrhage in the dentate nucleus of the cerebellum bilaterally. This causes some artifact on diffusion-weighted imaging.  Negative for mass or edema.  No shift of the midline structures  Image quality degraded by mild motion.  MRA HEAD FINDINGS  Both vertebral arteries patent to the basilar without stenosis. PICA patent bilaterally. Basilar widely patent. Superior cerebellar and posterior cerebral arteries appear normal  Cavernous carotid widely patent bilaterally. Anterior and middle cerebral arteries widely patent without stenosis  Negative  for cerebral aneurysm.  IMPRESSION: Negative for acute infarct.  Chronic microvascular ischemic change in the cerebral white matter bilaterally.  Scattered areas of chronic micro hemorrhage in the brain bilaterally, question history of uncontrolled hypertension.  Negative MRA circle Willis.   Electronically Signed   By: Franchot Gallo M.D.   On: 05/09/2015 08:16   Mr Jodene Nam Head/brain Wo Cm  05/09/2015   CLINICAL DATA:  TIA. Left facial numbness. Left arm and leg numbness and tingling. Dizziness. Leaning to left.  EXAM: MRI HEAD WITHOUT CONTRAST  MRA HEAD WITHOUT CONTRAST  TECHNIQUE: Multiplanar, multiecho pulse sequences of the brain and surrounding structures were obtained without intravenous contrast. Angiographic images of the head were obtained using MRA technique without contrast.  COMPARISON:  CT head 05/09/2015  FINDINGS: MRI HEAD FINDINGS  Negative for acute infarct.  Ventricle size normal. Mild age-appropriate atrophy. Chronic microvascular  ischemic change in the white matter bilaterally. No cortical infarct.  Pituitary normal in size. Cervical medullary junction normal. Mild mucosal edema paranasal sinuses.  Multiple areas of micro hemorrhage in the brain including the thalamus bilaterally. Chronic hemorrhage in the dentate nucleus of the cerebellum bilaterally. This causes some artifact on diffusion-weighted imaging.  Negative for mass or edema.  No shift of the midline structures  Image quality degraded by mild motion.  MRA HEAD FINDINGS  Both vertebral arteries patent to the basilar without stenosis. PICA patent bilaterally. Basilar widely patent. Superior cerebellar and posterior cerebral arteries appear normal  Cavernous carotid widely patent bilaterally. Anterior and middle cerebral arteries widely patent without stenosis  Negative for cerebral aneurysm.  IMPRESSION: Negative for acute infarct.  Chronic microvascular ischemic change in the cerebral white matter bilaterally.  Scattered areas of chronic micro hemorrhage in the brain bilaterally, question history of uncontrolled hypertension.  Negative MRA circle Willis.   Electronically Signed   By: Franchot Gallo M.D.   On: 05/09/2015 08:16    Medications:  Scheduled: . aspirin  300 mg Rectal Daily   Or  . aspirin  325 mg Oral Daily  . calcitRIOL  1 mcg Oral Q T,Th,Sa-HD  . doxazosin  8 mg Oral BID  . labetalol  300 mg Oral BID  . losartan  100 mg Oral QHS  . multivitamin  1 tablet Oral QHS  . simvastatin  10 mg Oral QHS  . sodium chloride  3 mL Intravenous Q12H    Assessment/Plan:  64 YO female with dizziness and chronic left leg decreased sensation.  MRI brain is negative for acute infarct. MRA has no large vessel occlusion or limiting flow.  AT this time would recommend Finishing stroke work up and ASA daily.  If Stroke work up negative Neurology will S/O.    Etta Quill PA-C [facility] [phone]  05/09/2015, 9:49 AM  Patient seen and evaluated. Agree  with above assessment and plan. MRI brain imaging reviewed and overall unremarkable for acute process. Continue daily ASA and complete TIA workup.   Jim Like, DO [facility] [phone]  If 7pm- 7am, please page neurology on call as listed in [facility].

## 2015-05-09 NOTE — Progress Notes (Signed)
Back to room from dialysis. A/O, denies pain or problems. SR up, call bell in reach, bed alarm on. Family at bedside. Will monitor.

## 2015-05-09 NOTE — ED Provider Notes (Signed)
CSN: [id number]     Arrival date & time 05/08/15  1858 History   First MD Initiated Contact with Patient 05/08/15 2302     Chief Complaint  Patient presents with  . Dizziness     (Consider location/radiation/quality/duration/timing/severity/associated sxs/prior Treatment) HPI Comments: Patient presents with 1 day of dizziness described as room spinning and feeling off balance with leaning towards the left with ambulation.  She also says she had several episodes of diaphoresis earlier today.  In addition, she has had decreased sensation to the left face and left lower leg.  She states she has had dizziness before, though not as severe and was told recently by her neurologist.  She appears to have had some STROKES in the past on an MRI.  She denies headache, chest pain, shortness of breath, fevers or chills.  Dizziness is worsened by movement is improved with rest, though has had some at rest as well.  It is also improved by closing her eyes.  Patient is a 64 y.o. female presenting with dizziness.  Dizziness Associated symptoms: nausea and weakness   Associated symptoms: no chest pain, no diarrhea, no headaches, no palpitations, no shortness of breath and no vomiting     Past Medical History  Diagnosis Date  . Anxiety   . Blood transfusion   . Depression   . GERD (gastroesophageal reflux disease)   . Hyperlipidemia   . Hypertension   . Neuromuscular disorder     neuropathy hand and legs  . Weight loss, unintentional   . Adenomatous polyp of colon 10/2010  . Diverticula, colon   . CHF (congestive heart failure)   . 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  Had deceased donor kidney transplant in 1996.  Had some early rejection then stable function for years, then had slow decline of function and went back on hemodialysis in 2012.  Gets HD TTS schedule at [facility] still using L forearm AVF.      Marland Kitchen Anemia in CKD (chronic kidney disease) 11/07/2012  . Arthritis   . Osteoporosis    Past Surgical History  Procedure Laterality Date  . Kidney transplant  1996  . Back surgery    . Cervical fusion    . Av fistula placement      for dialysis  . Cholecystectomy  12/02/2012    Procedure: LAPAROSCOPIC CHOLECYSTECTOMY WITH INTRAOPERATIVE CHOLANGIOGRAM;  Surgeon: Edward Jolly, MD;  Location: [facility] OR;  Service: General;  Laterality: N/A;   Family History  Problem Relation Age of Onset  . Colon cancer Brother   . Cancer Brother   . Coronary artery disease Mother 11  . Hyperlipidemia Mother   . Hypertension Mother   . Esophageal cancer Neg Hx   . Stomach cancer Neg Hx   . Rectal cancer Neg Hx    History  Substance Use Topics  . Smoking status: Former Smoker    Types: Cigarettes    Quit date: 12/31/1991  . Smokeless tobacco: Never Used  . Alcohol Use: No   OB History    No data available     Review of Systems  Constitutional: Negative for fever, chills, diaphoresis, activity change, appetite change and fatigue.  HENT: Negative for congestion, facial swelling, rhinorrhea and sore throat.   Eyes: Positive for visual disturbance. Negative for photophobia and discharge.  Respiratory: Negative for cough, chest tightness and shortness of breath.  Cardiovascular: Negative for chest pain, palpitations and leg swelling.  Gastrointestinal: Positive for nausea. Negative for vomiting, abdominal pain and diarrhea.  Endocrine: Negative for polydipsia and polyuria.  Genitourinary: Negative for dysuria, frequency, difficulty urinating and pelvic pain.  Musculoskeletal: Negative for back pain, arthralgias, neck pain and neck stiffness.  Skin: Negative for color change and wound.  Allergic/Immunologic: Negative for immunocompromised state.  Neurological: Positive for dizziness, weakness and numbness. Negative for facial asymmetry and headaches.  Hematological: Does not bruise/bleed  easily.  Psychiatric/Behavioral: Negative for confusion and agitation.      Allergies  Sulfa antibiotics  Home Medications   Prior to Admission medications   Medication Sig Start Date End Date Taking? Authorizing Provider  allopurinol (ZYLOPRIM) 300 MG tablet Take 150 mg by mouth at bedtime.   Yes Historical Provider, MD  ALPRAZolam Duanne Moron) 0.25 MG tablet Take 0.25 mg by mouth daily as needed for anxiety.   Yes Historical Provider, MD  aspirin EC 81 MG tablet Take 81 mg by mouth daily.   Yes Historical Provider, MD  calcium carbonate (TUMS - DOSED IN MG ELEMENTAL CALCIUM) 500 MG chewable tablet Chew 1 tablet by mouth 3 (three) times daily as needed. For acid   Yes Historical Provider, MD  cinacalcet (SENSIPAR) 30 MG tablet Take 30 mg by mouth daily.   Yes Historical Provider, MD  doxazosin (CARDURA) 8 MG tablet Take 1 tablet (8 mg total) by mouth 2 (two) times daily. Patient taking differently: Take 8 mg by mouth 2 (two) times daily as needed (hypertension).  11/14/14  Yes Deanna M Didiano, DO  HYDROcodone-acetaminophen (NORCO) 5-325 MG per tablet Take 1 tablet by mouth every 4 (four) hours as needed for moderate pain. 03/03/15  Yes Thao P Le, DO  labetalol (NORMODYNE) 300 MG tablet Take 300 mg by mouth 2 (two) times daily. 11/09/12  Yes Velvet Bathe, MD  losartan (COZAAR) 100 MG tablet Take 100 mg by mouth at bedtime.    Yes Historical Provider, MD  simvastatin (ZOCOR) 10 MG tablet Take 10 mg by mouth at bedtime.    Yes Historical Provider, MD  sorbitol 70 % solution Take 60 mLs by mouth daily as needed (constipation).    Yes Historical Provider, MD  zolpidem (AMBIEN) 10 MG tablet Take 10 mg by mouth at bedtime.   Yes Historical Provider, MD  b complex-vitamin c-folic acid (NEPHRO-VITE) 0.8 MG TABS tablet Take 1 tablet by mouth at bedtime.    Historical Provider, MD  Multiple Vitamin (MULTIVITAMIN WITH MINERALS) TABS tablet Take 1 tablet by mouth daily.    Historical Provider, MD   promethazine (PHENERGAN) 25 MG tablet Take 25 mg by mouth every 6 (six) hours as needed for nausea or vomiting.    Historical Provider, MD   BP 169/95 mmHg  Pulse 73  Temp(Src) 98 F (36.7 C) (Oral)  Resp 21  SpO2 100% Physical Exam  Constitutional: She is oriented to person, place, and time. She appears well-developed and well-nourished. No distress.  HENT:  Head: Normocephalic and atraumatic.  Mouth/Throat: No oropharyngeal exudate.  Eyes: Pupils are equal, round, and reactive to light.  Neck: Normal range of motion. Neck supple.  Cardiovascular: Normal rate, regular rhythm and normal heart sounds.  Exam reveals no gallop and no friction rub.   No murmur heard. Pulmonary/Chest: Effort normal and breath sounds normal. No respiratory distress. She has no wheezes. She has no rales.  Abdominal: Soft. Bowel sounds are normal. She exhibits no distension and no mass. There  is no tenderness. There is no rebound and no guarding.  Musculoskeletal: Normal range of motion. She exhibits no edema or tenderness.  Neurological: She is alert and oriented to person, place, and time. She has normal strength. A sensory deficit is present. No cranial nerve deficit. She exhibits normal muscle tone. Gait abnormal. Coordination normal. GCS eye subscore is 4. GCS verbal subscore is 5. GCS motor subscore is 6.  Cannot ambulate due to dizziness.  Reports decreased sensation to the left face and left lower leg  Skin: Skin is warm and dry.  Psychiatric: She has a normal mood and affect.    ED Course  Procedures (including critical care time) Labs Review Labs Reviewed  CBC WITH DIFFERENTIAL/PLATELET - Abnormal; Notable for the following:    RBC 3.79 (*)    Hemoglobin 11.1 (*)    HCT 35.2 (*)    RDW 18.1 (*)    All other components within normal limits  COMPREHENSIVE METABOLIC PANEL - Abnormal; Notable for the following:    Chloride 93 (*)    Glucose, Bld 152 (*)    BUN 38 (*)    Creatinine, Ser 6.75  (*)    Albumin 3.4 (*)    ALT 11 (*)    GFR calc non Af Amer 6 (*)    GFR calc Af Amer 7 (*)    Anion gap 19 (*)    All other components within normal limits  CBG MONITORING, ED - Abnormal; Notable for the following:    Glucose-Capillary 146 (*)    All other components within normal limits  I-STAT TROPOININ, ED    Imaging Review Ct Head Wo Contrast  05/09/2015   CLINICAL DATA:  Dizziness and diaphoresis onset today. Left-sided paresthesias  EXAM: CT HEAD WITHOUT CONTRAST  TECHNIQUE: Contiguous axial images were obtained from the base of the skull through the vertex without intravenous contrast.  COMPARISON:  07/28/2014  FINDINGS: Skull and Sinuses:Stable widening of the predental space with moderate canal stenosis. This is new from 2006 cervical spine MRI. No acute fracture or destructive process. No sinusitis.  Orbits: Bilateral cataract resection.  No acute findings.  Brain: No evidence of acute infarction, hemorrhage, hydrocephalus, or mass lesion/mass effect.  There is chronic small vessel disease with patchy ischemic gliosis in the bilateral cerebral white matter. There is a new but chronic appearing lacunar infarct in the right corona radiata. A small ischemic insult in the left thalamus is chronic.  IMPRESSION: 1. No acute findings. 2. Chronic small vessel disease. A lacunar infarct in the right corona radiata has developed since 07/28/2014 but appears chronic. 3. 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 If there is chronic neck pain this could be evaluated with outpatient cervical spine imaging   Electronically Signed   By: Monte Fantasia M.D.   On: 05/09/2015 01:27     EKG Interpretation   Date/Time:  Wednesday May 08 2015 19:21:00 EDT Ventricular Rate:  76 PR Interval:  166 QRS Duration: 74 QT Interval:  458 QTC Calculation: 515 R Axis:   63 Text Interpretation:  Normal sinus rhythm Minimal voltage criteria for  LVH, may be  normal variant Possible Anterior infarct , age undetermined  Prolonged QT Abnormal ECG No significant change since last tracing  Confirmed by Kane [phone]) on 05/09/2015 12:42:40 AM      MDM   Final diagnoses:  Dizziness  Paresthesias    Pt is a 64 y.o. female  with Pmhx as above who presents with 2 days of dizziness.  On exam, patient is unable to ambulate due to the dizziness also is found to have left facial and left lower leg paresthesias.  She has had known strokes on an outpatient MRI and also has known carotid disease.  CT head with no acute findings.  Given exam and no risk factors, I feel she requires admission for inpatient stroke workup.  She is on 81 mg aspirin daily.  She has been hypertensive in the department, but vital signs otherwise stable.  EKG with no acute ischemic changes.       Ernestina Patches, MD 05/09/15 [phone]

## 2015-05-09 NOTE — Consult Note (Signed)
Referring Physician: Shanon Brow    Chief Complaint: Dizziness  HPI: [name] is an 64 y.o. female who reports going to bed normal on Tuesday.  She awakened Wednesday and was dizzy.  She reports that she has had dizzy spells in the past but not this severe.  She also noted numbness in the left side of her face and her left leg.  The numbness is chronic and is intermittent in her face and persistent in her leg.  In the past she has been told that her leg numbness is due to back problems. Due to the persistent nature of the dizziness the patient presented for evaluation.    Date last known well: Date: 05/07/2015 Time last known well: Time: 21:00 tPA Given: No: Outside time window  Past Medical History  Diagnosis Date  . Anxiety   . Blood transfusion   . Depression   . GERD (gastroesophageal reflux disease)   . Hyperlipidemia   . Hypertension   . Neuromuscular disorder     neuropathy hand and legs  . Weight loss, unintentional   . Adenomatous polyp of colon 10/2010  . Diverticula, colon   . CHF (congestive heart failure)   . 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  Had deceased donor kidney transplant in 1996.  Had some early rejection then stable function for years, then had slow decline of function and went back on hemodialysis in 2012.  Gets HD TTS schedule at [facility] still using L forearm AVF.     Marland Kitchen Anemia in CKD (chronic kidney disease) 11/07/2012  . Arthritis   . Osteoporosis     Past Surgical History  Procedure Laterality Date  . Kidney transplant  1996  . Back surgery    . Cervical fusion    . Av fistula placement      for dialysis  . Cholecystectomy  12/02/2012    Procedure: LAPAROSCOPIC CHOLECYSTECTOMY WITH INTRAOPERATIVE CHOLANGIOGRAM;  Surgeon: Edward Jolly, MD;  Location: [facility] OR;  Service: General;  Laterality: N/A;    Family History  Problem Relation Age of  Onset  . Colon cancer Brother   . Cancer Brother   . Coronary artery disease Mother 32  . Hyperlipidemia Mother   . Hypertension Mother   . Esophageal cancer Neg Hx   . Stomach cancer Neg Hx   . Rectal cancer Neg Hx    Social History:  reports that she quit smoking about 23 years ago. Her smoking use included Cigarettes. She has never used smokeless tobacco. She reports that she does not drink alcohol or use illicit drugs.  Allergies:  Allergies  Allergen Reactions  . Sulfa Antibiotics Other (See Comments)    Both parents allergic-so will not take    Medications:  I have reviewed the patient's current medications. Prior to Admission:  Prescriptions prior to admission  Medication Sig Dispense Refill Last Dose  . allopurinol (ZYLOPRIM) 300 MG tablet Take 150 mg by mouth at bedtime.   05/07/2015 at Unknown time  . ALPRAZolam (XANAX) 0.25 MG tablet Take 0.25 mg by mouth daily as needed for anxiety.   Past Month at Unknown time  . aspirin EC 81 MG tablet Take 81 mg by mouth daily.   05/07/2015 at Unknown time  . calcium carbonate (TUMS - DOSED IN MG ELEMENTAL CALCIUM) 500 MG chewable tablet Chew 1 tablet by mouth 3 (three) times daily  as needed. For acid   05/07/2015 at Unknown time  . cinacalcet (SENSIPAR) 30 MG tablet Take 30 mg by mouth daily.   Past Month at Unknown time  . doxazosin (CARDURA) 8 MG tablet Take 1 tablet (8 mg total) by mouth 2 (two) times daily. (Patient taking differently: Take 8 mg by mouth 2 (two) times daily as needed (hypertension). ) 60 tablet 1 couple months  . HYDROcodone-acetaminophen (NORCO) 5-325 MG per tablet Take 1 tablet by mouth every 4 (four) hours as needed for moderate pain. 20 tablet 0 several months  . labetalol (NORMODYNE) 300 MG tablet Take 300 mg by mouth 2 (two) times daily.   05/07/2015 at 2130  . losartan (COZAAR) 100 MG tablet Take 100 mg by mouth at bedtime.    05/07/2015 at Unknown time  . simvastatin (ZOCOR) 10 MG tablet Take 10 mg by mouth at  bedtime.    05/07/2015 at Unknown time  . sorbitol 70 % solution Take 60 mLs by mouth daily as needed (constipation).    unk  . zolpidem (AMBIEN) 10 MG tablet Take 10 mg by mouth at bedtime.   05/07/2015 at Unknown time  . b complex-vitamin c-folic acid (NEPHRO-VITE) 0.8 MG TABS tablet Take 1 tablet by mouth at bedtime.   Taking  . Multiple Vitamin (MULTIVITAMIN WITH MINERALS) TABS tablet Take 1 tablet by mouth daily.   Taking  . promethazine (PHENERGAN) 25 MG tablet Take 25 mg by mouth every 6 (six) hours as needed for nausea or vomiting.   Taking   Scheduled: . aspirin  300 mg Rectal Daily   Or  . aspirin  325 mg Oral Daily  . doxazosin  8 mg Oral BID  . labetalol  300 mg Oral BID  . losartan  100 mg Oral QHS  . simvastatin  10 mg Oral QHS  . sodium chloride  3 mL Intravenous Q12H    ROS: History obtained from the patient  General ROS: fatigue Psychological ROS: negative for - behavioral disorder, hallucinations, memory difficulties, mood swings or suicidal ideation Ophthalmic ROS: negative for - blurry vision, double vision, eye pain or loss of vision ENT ROS: negative for - epistaxis, nasal discharge, oral lesions, sore throat, tinnitus or vertigo Allergy and Immunology ROS: negative for - hives or itchy/watery eyes Hematological and Lymphatic ROS: negative for - bleeding problems, bruising or swollen lymph nodes Endocrine ROS: negative for - galactorrhea, hair pattern changes, polydipsia/polyuria or temperature intolerance Respiratory ROS: negative for - cough, hemoptysis, shortness of breath or wheezing Cardiovascular ROS: negative for - chest pain, dyspnea on exertion, edema or irregular heartbeat Gastrointestinal ROS: negative for - abdominal pain, diarrhea, hematemesis, nausea/vomiting or stool incontinence Genito-Urinary ROS: negative for - dysuria, hematuria, incontinence or urinary frequency/urgency Musculoskeletal ROS: back pain Neurological ROS: as noted in  HPI Dermatological ROS: negative for rash and skin lesion changes  Physical Examination: Blood pressure 169/85, pulse 74, temperature 98.2 F (36.8 C), temperature source Oral, resp. rate 16, height 5\' 2"  (1.575 m), weight 55.974 kg (123 lb 6.4 oz), SpO2 99 %.  HEENT-  Normocephalic, no lesions, without obvious abnormality.  Normal external eye and conjunctiva.  Normal TM's bilaterally.  Normal auditory canals and external ears. Normal external nose, mucus membranes and septum.  Normal pharynx. Cardiovascular- S1, S2 normal, pulses palpable throughout   Lungs- chest clear, no wheezing, rales, normal symmetric air entry Abdomen- soft, non-tender; bowel sounds normal; no masses,  no organomegaly Extremities- arthritic changes in hands Lymph-no adenopathy palpable  Musculoskeletal-no joint tenderness or swelling Skin-warm and dry, no hyperpigmentation, vitiligo, or suspicious lesions  Neurological Examination Mental Status: Alert, oriented, thought content appropriate.  Speech fluent without evidence of aphasia.  Able to follow 3 step commands without difficulty. Cranial Nerves: II: Discs flat bilaterally; Visual fields grossly normal, pupils equal, round, reactive to light and accommodation III,IV, VI: ptosis not present, extra-ocular motions intact bilaterally V,VII: decreased left NLF, facial light touch sensation normal bilaterally VIII: hearing normal bilaterally IX,X: gag reflex present XI: bilateral shoulder shrug XII: midline tongue extension Motor: Right : Upper extremity   5/5    Left:     Upper extremity   5/5  Lower extremity   5/5     Lower extremity   5/5 Tone and bulk:normal tone throughout; no atrophy noted Sensory: Pinprick and light touch decreased in the LLE Deep Tendon Reflexes: 2+ in the upper extremities and at the right KJ.  Left KJ 1+.  Absent AJ's bilaterally Plantars: Right: mute   Left: mute Cerebellar: normal finger-to-nose and normal heel-to-shin testing  bilaterally   Laboratory Studies:  Basic Metabolic Panel:  Recent Labs Lab 05/08/15 1924  NA 139  K 4.2  CL 93*  CO2 27  GLUCOSE 152*  BUN 38*  CREATININE 6.75*  CALCIUM 9.5    Liver Function Tests:  Recent Labs Lab 05/08/15 1924  AST 21  ALT 11*  ALKPHOS 94  BILITOT 0.4  PROT 8.0  ALBUMIN 3.4*   No results for input(s): LIPASE, AMYLASE in the last 168 hours. No results for input(s): AMMONIA in the last 168 hours.  CBC:  Recent Labs Lab 05/08/15 1924  WBC 6.5  NEUTROABS 3.5  HGB 11.1*  HCT 35.2*  MCV 92.9  PLT 255    Cardiac Enzymes: No results for input(s): CKTOTAL, CKMB, CKMBINDEX, TROPONINI in the last 168 hours.  BNP: Invalid input(s): POCBNP  CBG:  Recent Labs Lab 05/08/15 1914  GLUCAP 146*    Microbiology: Results for orders placed or performed during the hospital encounter of 07/31/14  Blood culture (routine x 2)     Status: None   Collection Time: 07/31/14  5:50 AM  Result Value Ref Range Status   Specimen Description BLOOD RIGHT ANTECUBITAL  Final   Special Requests BOTTLES DRAWN AEROBIC AND ANAEROBIC 10CC EA  Final   Culture  Setup Time   Final    07/31/2014 10:30 Performed at [facility]   Culture   Final    NO GROWTH 5 DAYS Performed at [facility]   Report Status 08/06/2014 FINAL  Final  Blood culture (routine x 2)     Status: None   Collection Time: 07/31/14  5:55 AM  Result Value Ref Range Status   Specimen Description BLOOD RIGHT HAND  Final   Special Requests BOTTLES DRAWN AEROBIC AND ANAEROBIC 5CC EA  Final   Culture  Setup Time   Final    07/31/2014 10:30 Performed at [facility]   Final    NO GROWTH 5 DAYS Performed at [facility]   Report Status 08/06/2014 FINAL  Final    Coagulation Studies: No results for input(s): LABPROT, INR in the last 72 hours.  Urinalysis: No results for input(s): COLORURINE, LABSPEC, PHURINE, GLUCOSEU, HGBUR, BILIRUBINUR, KETONESUR,  PROTEINUR, UROBILINOGEN, NITRITE, LEUKOCYTESUR in the last 168 hours.  Invalid input(s): APPERANCEUR  Lipid Panel: No results found for: CHOL, TRIG, HDL, CHOLHDL, VLDL, LDLCALC  HgbA1C: No results found for: HGBA1C  Urine Drug Screen:  No results found for: LABOPIA, COCAINSCRNUR, LABBENZ, AMPHETMU, THCU, LABBARB  Alcohol Level: No results for input(s): ETH in the last 168 hours.  Other results: EKG: normal sinus rhythm at 76 bpm, prolonged QT.  Imaging: Ct Head Wo Contrast  05/09/2015   CLINICAL DATA:  Dizziness and diaphoresis onset today. Left-sided paresthesias  EXAM: CT HEAD WITHOUT CONTRAST  TECHNIQUE: Contiguous axial images were obtained from the base of the skull through the vertex without intravenous contrast.  COMPARISON:  07/28/2014  FINDINGS: Skull and Sinuses:Stable widening of the predental space with moderate canal stenosis. This is new from 2006 cervical spine MRI. No acute fracture or destructive process. No sinusitis.  Orbits: Bilateral cataract resection.  No acute findings.  Brain: No evidence of acute infarction, hemorrhage, hydrocephalus, or mass lesion/mass effect.  There is chronic small vessel disease with patchy ischemic gliosis in the bilateral cerebral white matter. There is a new but chronic appearing lacunar infarct in the right corona radiata. A small ischemic insult in the left thalamus is chronic.  IMPRESSION: 1. No acute findings. 2. Chronic small vessel disease. A lacunar infarct in the right corona radiata has developed since 07/28/2014 but appears chronic. 3. 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 If there is chronic neck pain this could be evaluated with outpatient cervical spine imaging   Electronically Signed   By: Monte Fantasia M.D.   On: 05/09/2015 01:27    Assessment: 64 y.o. female presenting with complaints of dizziness.  Has had infarcts in the past.  On ASA.  Head CT personally reviewed and new  hypodensities since 2015 but no acute changes.  Further work up recommended.  Stroke Risk Factors - hyperlipidemia and hypertension  Plan: 1. HgbA1c, fasting lipid panel 2. MRI, MRA  of the brain without contrast 3. PT consult, OT consult, Speech consult 4. Echocardiogram 5. Carotid dopplers 6. Prophylactic therapy-Continue ASA 7. NPO until RN stroke swallow screen 8. Telemetry monitoring 9. Frequent neuro checks   Alexis Goodell, MD [facility] [phone] 05/09/2015, 6:01 AM

## 2015-05-09 NOTE — Progress Notes (Signed)
Patient received to room 5W18 from ED via stretcher, transferred to assigned bed, SR up, call bell in reach, bed alarm on. Admission paperwork/assessment complete, patient denies pain or problems at this time. Oriented to room, call bell, bed controls, patient guide booklet and all orders with verbal understanding. IV to RAC flushed without problems, site clear, Tele box applied and monitor room notified of patient arrival. No problems noted or voiced at this time, will monitor.

## 2015-05-09 NOTE — Consult Note (Signed)
[facility] Renal Consultation Note    Indication for Consultation:  Management of ESRD/hemodialysis; anemia, hypertension/volume and secondary hyperparathyroidism PCP:  HPI: [name] is a 64 y.o. female with ESRD on TTS dialysis at [place] with hx of failed transplant who resumed  HD November 2012, anxiety/depression, hx GIB, hx cervical fusion, presented with the acute onset of dizziness with room spinning and nausea on Wed.  She said she was given a medicine (meclizine) for dizziness, which helped but it made her jittery. She had associated left leg and left face numbness.  Her facial numbness has since resolved, but she still has numbness at left lateral knee. She has been weak with a poor appetite. She has had no diarrhea or fevers. She has been very stressed about financial issues, but states that is about to resolve. She had a recent right wrist fracture that healed with a cast. Her EDW was just raised due to cramping.  BPs range from 160s - 190s/90 - 110s pre HD to 140 - 150/80s post HD. She currently lives alone and is due for HD today. I had seen [name] at HD on Tuesday- she said this all started Wednesday AM.  Currently she feels better- just weak from the ordeal   Past Medical History  Diagnosis Date  . Anxiety   . Blood transfusion   . Depression   . GERD (gastroesophageal reflux disease)   . Hyperlipidemia   . Hypertension   . Neuromuscular disorder     neuropathy hand and legs  . Weight loss, unintentional   . Adenomatous polyp of colon 10/2010  . Diverticula, colon   . CHF (congestive heart failure)   . 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  Had deceased donor kidney transplant in 1996.  Had some early rejection then stable function for years, then had slow decline of function and went back on hemodialysis in 2012.  Gets HD TTS schedule at [facility] on [facility] still  using L forearm AVF.     Marland Kitchen Anemia in CKD (chronic kidney disease) 11/07/2012  . Arthritis   . Osteoporosis    Past Surgical History  Procedure Laterality Date  . Kidney transplant  1996  . Back surgery    . Cervical fusion    . Av fistula placement      for dialysis  . Cholecystectomy  12/02/2012    Procedure: LAPAROSCOPIC CHOLECYSTECTOMY WITH INTRAOPERATIVE CHOLANGIOGRAM;  Surgeon: Edward Jolly, MD;  Location: [facility] OR;  Service: General;  Laterality: N/A;   Family History  Problem Relation Age of Onset  . Colon cancer Brother   . Cancer Brother   . Coronary artery disease Mother 63  . Hyperlipidemia Mother   . Hypertension Mother   . Esophageal cancer Neg Hx   . Stomach cancer Neg Hx   . Rectal cancer Neg Hx    Social History:  reports that she quit smoking about 23 years ago. Her smoking use included Cigarettes. She has never used smokeless tobacco. She reports that she does not drink alcohol or use illicit drugs. Allergies  Allergen Reactions  . Sulfa Antibiotics Other (See Comments)    Both parents allergic-so will not take   Prior to Admission medications   Medication Sig Start Date End Date Taking? Authorizing Provider  allopurinol (ZYLOPRIM) 300 MG tablet Take 150 mg by mouth at bedtime.   Yes Historical Provider,  MD  ALPRAZolam (XANAX) 0.25 MG tablet Take 0.25 mg by mouth daily as needed for anxiety.   Yes Historical Provider, MD  aspirin EC 81 MG tablet Take 81 mg by mouth daily.   Yes Historical Provider, MD  calcium carbonate (TUMS - DOSED IN MG ELEMENTAL CALCIUM) 500 MG chewable tablet Chew 1 tablet by mouth 3 (three) times daily as needed. For acid   Yes Historical Provider, MD  cinacalcet (SENSIPAR) 30 MG tablet Take 30 mg by mouth daily.   Yes Historical Provider, MD  doxazosin (CARDURA) 8 MG tablet Take 1 tablet (8 mg total) by mouth 2 (two) times daily. Patient taking differently: Take 8 mg by mouth 2 (two) times daily as needed (hypertension).  11/14/14   Yes Deanna M Didiano, DO  HYDROcodone-acetaminophen (NORCO) 5-325 MG per tablet Take 1 tablet by mouth every 4 (four) hours as needed for moderate pain. 03/03/15  Yes Thao P Le, DO  labetalol (NORMODYNE) 300 MG tablet Take 300 mg by mouth 2 (two) times daily. 11/09/12  Yes Velvet Bathe, MD  losartan (COZAAR) 100 MG tablet Take 100 mg by mouth at bedtime.    Yes Historical Provider, MD  simvastatin (ZOCOR) 10 MG tablet Take 10 mg by mouth at bedtime.    Yes Historical Provider, MD  sorbitol 70 % solution Take 60 mLs by mouth daily as needed (constipation).    Yes Historical Provider, MD  zolpidem (AMBIEN) 10 MG tablet Take 10 mg by mouth at bedtime.   Yes Historical Provider, MD  b complex-vitamin c-folic acid (NEPHRO-VITE) 0.8 MG TABS tablet Take 1 tablet by mouth at bedtime.    Historical Provider, MD  Multiple Vitamin (MULTIVITAMIN WITH MINERALS) TABS tablet Take 1 tablet by mouth daily.    Historical Provider, MD  promethazine (PHENERGAN) 25 MG tablet Take 25 mg by mouth every 6 (six) hours as needed for nausea or vomiting.    Historical Provider, MD   Current Facility-Administered Medications  Medication Dose Route Frequency Provider Last Rate Last Dose  . 0.9 %  sodium chloride infusion  250 mL Intravenous PRN Phillips Grout, MD      . aspirin suppository 300 mg  300 mg Rectal Daily Phillips Grout, MD       Or  . aspirin tablet 325 mg  325 mg Oral Daily Rachal A Shanon Brow, MD      . calcitRIOL (ROCALTROL) capsule 1 mcg  1 mcg Oral Q T,Th,Sa-HD Alric Seton, PA-C      . doxazosin (CARDURA) tablet 8 mg  8 mg Oral BID Phillips Grout, MD   0 mg at 05/09/15 1600  . labetalol (NORMODYNE) tablet 300 mg  300 mg Oral BID Phillips Grout, MD   Stopped at 05/09/15 1000  . losartan (COZAAR) tablet 100 mg  100 mg Oral QHS Phillips Grout, MD      . multivitamin (RENA-VIT) tablet 1 tablet  1 tablet Oral QHS Alric Seton, PA-C      . simvastatin (ZOCOR) tablet 10 mg  10 mg Oral QHS Phillips Grout, MD       . sodium chloride 0.9 % injection 3 mL  3 mL Intravenous Q12H Phillips Grout, MD   3 mL at 05/09/15 0337  . sodium chloride 0.9 % injection 3 mL  3 mL Intravenous PRN Phillips Grout, MD       Labs: Basic Metabolic Panel:  Recent Labs Lab 05/08/15 1924  NA 139  K 4.2  CL 93*  CO2 27  GLUCOSE 152*  BUN 38*  CREATININE 6.75*  CALCIUM 9.5   Liver Function Tests:  Recent Labs Lab 05/08/15 1924  AST 21  ALT 11*  ALKPHOS 94  BILITOT 0.4  PROT 8.0  ALBUMIN 3.4*   CBC:  Recent Labs Lab 05/08/15 1924  WBC 6.5  NEUTROABS 3.5  HGB 11.1*  HCT 35.2*  MCV 92.9  PLT 255  CBG:  Recent Labs Lab 05/08/15 1914  GLUCAP 146*   Studies/Results: Ct Head Wo Contrast  05/09/2015   CLINICAL DATA:  Dizziness and diaphoresis onset today. Left-sided paresthesias  EXAM: CT HEAD WITHOUT CONTRAST  TECHNIQUE: Contiguous axial images were obtained from the base of the skull through the vertex without intravenous contrast.  COMPARISON:  07/28/2014  FINDINGS: Skull and Sinuses:Stable widening of the predental space with moderate canal stenosis. This is new from 2006 cervical spine MRI. No acute fracture or destructive process. No sinusitis.  Orbits: Bilateral cataract resection.  No acute findings.  Brain: No evidence of acute infarction, hemorrhage, hydrocephalus, or mass lesion/mass effect.  There is chronic small vessel disease with patchy ischemic gliosis in the bilateral cerebral white matter. There is a new but chronic appearing lacunar infarct in the right corona radiata. A small ischemic insult in the left thalamus is chronic.  IMPRESSION: 1. No acute findings. 2. Chronic small vessel disease. A lacunar infarct in the right corona radiata has developed since 07/28/2014 but appears chronic. 3. 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 If there is chronic neck pain this could be evaluated with outpatient cervical spine imaging   Electronically  Signed   By: Monte Fantasia M.D.   On: 05/09/2015 01:27   Mr Brain Wo Contrast  05/09/2015   CLINICAL DATA:  TIA. Left facial numbness. Left arm and leg numbness and tingling. Dizziness. Leaning to left.  EXAM: MRI HEAD WITHOUT CONTRAST  MRA HEAD WITHOUT CONTRAST  TECHNIQUE: Multiplanar, multiecho pulse sequences of the brain and surrounding structures were obtained without intravenous contrast. Angiographic images of the head were obtained using MRA technique without contrast.  COMPARISON:  CT head 05/09/2015  FINDINGS: MRI HEAD FINDINGS  Negative for acute infarct.  Ventricle size normal. Mild age-appropriate atrophy. Chronic microvascular ischemic change in the white matter bilaterally. No cortical infarct.  Pituitary normal in size. Cervical medullary junction normal. Mild mucosal edema paranasal sinuses.  Multiple areas of micro hemorrhage in the brain including the thalamus bilaterally. Chronic hemorrhage in the dentate nucleus of the cerebellum bilaterally. This causes some artifact on diffusion-weighted imaging.  Negative for mass or edema.  No shift of the midline structures  Image quality degraded by mild motion.  MRA HEAD FINDINGS  Both vertebral arteries patent to the basilar without stenosis. PICA patent bilaterally. Basilar widely patent. Superior cerebellar and posterior cerebral arteries appear normal  Cavernous carotid widely patent bilaterally. Anterior and middle cerebral arteries widely patent without stenosis  Negative for cerebral aneurysm.  IMPRESSION: Negative for acute infarct.  Chronic microvascular ischemic change in the cerebral white matter bilaterally.  Scattered areas of chronic micro hemorrhage in the brain bilaterally, question history of uncontrolled hypertension.  Negative MRA circle Willis.   Electronically Signed   By: Franchot Gallo M.D.   On: 05/09/2015 08:16   Mr Jodene Nam Head/brain Wo Cm  05/09/2015   CLINICAL DATA:  TIA. Left facial numbness. Left arm and leg numbness and  tingling. Dizziness. Leaning to left.  EXAM:  MRI HEAD WITHOUT CONTRAST  MRA HEAD WITHOUT CONTRAST  TECHNIQUE: Multiplanar, multiecho pulse sequences of the brain and surrounding structures were obtained without intravenous contrast. Angiographic images of the head were obtained using MRA technique without contrast.  COMPARISON:  CT head 05/09/2015  FINDINGS: MRI HEAD FINDINGS  Negative for acute infarct.  Ventricle size normal. Mild age-appropriate atrophy. Chronic microvascular ischemic change in the white matter bilaterally. No cortical infarct.  Pituitary normal in size. Cervical medullary junction normal. Mild mucosal edema paranasal sinuses.  Multiple areas of micro hemorrhage in the brain including the thalamus bilaterally. Chronic hemorrhage in the dentate nucleus of the cerebellum bilaterally. This causes some artifact on diffusion-weighted imaging.  Negative for mass or edema.  No shift of the midline structures  Image quality degraded by mild motion.  MRA HEAD FINDINGS  Both vertebral arteries patent to the basilar without stenosis. PICA patent bilaterally. Basilar widely patent. Superior cerebellar and posterior cerebral arteries appear normal  Cavernous carotid widely patent bilaterally. Anterior and middle cerebral arteries widely patent without stenosis  Negative for cerebral aneurysm.  IMPRESSION: Negative for acute infarct.  Chronic microvascular ischemic change in the cerebral white matter bilaterally.  Scattered areas of chronic micro hemorrhage in the brain bilaterally, question history of uncontrolled hypertension.  Negative MRA circle Willis.   Electronically Signed   By: Franchot Gallo M.D.   On: 05/09/2015 08:16    ROS: As per HPI otherwise negative.  Physical Exam: Filed Vitals:   05/09/15 0245 05/09/15 0357 05/09/15 0403 05/09/15 0747  BP: 172/87  169/85 159/90  Pulse: 71  74   Temp:   98.2 F (36.8 C) 98 F (36.7 C)  TempSrc:   Oral Oral  Resp: 11  16 15   Height:  5\' 2"   (1.575 m)    Weight:  55.974 kg (123 lb 6.4 oz)    SpO2: 100%  99%      General: Slender AAF in no acute distress. Head: Normocephalic, atraumatic, sclera non-icteric, mucus membranes are moist Neck: Supple. JVD not elevated. Lungs: Clear bilaterally to auscultation without wheezes, rales, or rhonchi. Breathing is unlabored. Heart: RRR with S1 S2. No murmurs, Abdomen: Soft, non-tender, non-distended + BS, transplant kidney in LLQ nontender Lower extremities:without edema or ischemic changes, no open wounds  Neuro: Alert and oriented X 3. Moves all extremities spontaneously. Psych:  Responds to questions appropriately with a anxious affect, somewhat restless Dialysis Access:left AVF + bruit  Dialysis Orders: [facility] 4 hr 350/A 1.5 2 K 2 Ca EDW 56.5 heparin 3000 Mircera 100 last given 5/24 Calcitriol 1 Recent labs: Hgb 9.6 5/19 24% sat with ferritin 1830 in April iPTH 821 April   Assessment/Plan: 1. Dizziness - Neuro seeing; meclizine seemed to help symptoms, but doesn't explain facial or left leg numbness - CT/MRI - no acute finds, prior infarcts, troponins  2. ESRD -  TTS - HD today per routine K 4.2 today - if K > 4.3 on recheck change to 2 K 2 Ca bath 3. Hypertension/volume  - volume control plus meds 4. Anemia  - Hgb 11.1  Up from 9.6 5/19 - Mircera given earlier this week - not on IV Fe 5. Metabolic bone disease -  continue calcitriol - iPTH 821 - calcitriol recently increased- resume sensipar and tums 6. Nutrition - renal diet + vitamin 7. Anxiety/depression - worsening by recent financial stressors -   Myriam Jacobson, PA-C [place] [phone] 05/09/2015, 10:29 AM   Patient  seen and examined, agree with above note with above modifications. 64 year old well known to me- presenting with acute dizziness and facial numbness- imaging no acute findings- sxms appear to be better.  Has seen neurology as OP and diagnosed with some white matter dz as a  complicating feature. BP is not perfectly controlled as OP.  Plan for HD today- likely will be discharged soon  Corliss Parish, MD 05/09/2015

## 2015-05-09 NOTE — Progress Notes (Signed)
Patient seen and evaluated earlier this a.m. by my associate. These refer to H&P for details regarding assessment and plan.  Discussed with neurology who plans to complete workup and if negative patient may be discharged from their standpoint.  Patient evaluated in no acute distress. No other medical concerns that she would want me to address today  We'll reassess next a.m.  Tery Hoeger, [employer]

## 2015-05-09 NOTE — H&P (Signed)
PCP:   Kandice Hams, MD   Chief Complaint:  dizziness  HPI: 64 yo female h/o esrd, htn, chf, anemia comes in with several days of left facial numbness along with numbness and tingling in her leg arm and leg.  This am she woke up and had dizziness, and was having difficulty walking and was leaning to the left.  No fevers.  No recent illnesses.  No n/v/d.  No rashes.   No headache or vision changes.  She has never had benign positional vertigo before.  She takes a baby aspirin a day.  She denies any facial drooping, drooling or weakness anywhere.  We are asked to evaluate patient for possible stroke.  Pt reports her bp is never controlled.  Review of Systems:  Positive and negative as per HPI otherwise all other systems are negative  Past Medical History: Past Medical History  Diagnosis Date  . Anxiety   . Blood transfusion   . Depression   . GERD (gastroesophageal reflux disease)   . Hyperlipidemia   . Hypertension   . Neuromuscular disorder     neuropathy hand and legs  . Weight loss, unintentional   . Adenomatous polyp of colon 10/2010  . Diverticula, colon   . CHF (congestive heart failure)   . 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  Had deceased donor kidney transplant in 1996.  Had some early rejection then stable function for years, then had slow decline of function and went back on hemodialysis in 2012.  Gets HD TTS schedule at [facility] on [facility] still using L forearm AVF.     Marland Kitchen Anemia in CKD (chronic kidney disease) 11/07/2012  . Arthritis   . Osteoporosis    Past Surgical History  Procedure Laterality Date  . Kidney transplant  1996  . Back surgery    . Cervical fusion    . Av fistula placement      for dialysis  . Cholecystectomy  12/02/2012    Procedure: LAPAROSCOPIC CHOLECYSTECTOMY WITH INTRAOPERATIVE CHOLANGIOGRAM;  Surgeon: Edward Jolly, MD;  Location: [facility] OR;   Service: General;  Laterality: N/A;    Medications: Prior to Admission medications   Medication Sig Start Date End Date Taking? Authorizing Provider  allopurinol (ZYLOPRIM) 300 MG tablet Take 150 mg by mouth at bedtime.   Yes Historical Provider, MD  ALPRAZolam Duanne Moron) 0.25 MG tablet Take 0.25 mg by mouth daily as needed for anxiety.   Yes Historical Provider, MD  aspirin EC 81 MG tablet Take 81 mg by mouth daily.   Yes Historical Provider, MD  calcium carbonate (TUMS - DOSED IN MG ELEMENTAL CALCIUM) 500 MG chewable tablet Chew 1 tablet by mouth 3 (three) times daily as needed. For acid   Yes Historical Provider, MD  cinacalcet (SENSIPAR) 30 MG tablet Take 30 mg by mouth daily.   Yes Historical Provider, MD  doxazosin (CARDURA) 8 MG tablet Take 1 tablet (8 mg total) by mouth 2 (two) times daily. Patient taking differently: Take 8 mg by mouth 2 (two) times daily as needed (hypertension).  11/14/14  Yes Deanna M Didiano, DO  HYDROcodone-acetaminophen (NORCO) 5-325 MG per tablet Take 1 tablet by mouth every 4 (four) hours as needed for moderate pain. 03/03/15  Yes Thao P Le, DO  labetalol (NORMODYNE) 300 MG tablet Take 300 mg by mouth 2 (two) times daily. 11/09/12  Yes Velvet Bathe, MD  losartan (  COZAAR) 100 MG tablet Take 100 mg by mouth at bedtime.    Yes Historical Provider, MD  simvastatin (ZOCOR) 10 MG tablet Take 10 mg by mouth at bedtime.    Yes Historical Provider, MD  sorbitol 70 % solution Take 60 mLs by mouth daily as needed (constipation).    Yes Historical Provider, MD  zolpidem (AMBIEN) 10 MG tablet Take 10 mg by mouth at bedtime.   Yes Historical Provider, MD  b complex-vitamin c-folic acid (NEPHRO-VITE) 0.8 MG TABS tablet Take 1 tablet by mouth at bedtime.    Historical Provider, MD  Multiple Vitamin (MULTIVITAMIN WITH MINERALS) TABS tablet Take 1 tablet by mouth daily.    Historical Provider, MD  promethazine (PHENERGAN) 25 MG tablet Take 25 mg by mouth every 6 (six) hours as needed  for nausea or vomiting.    Historical Provider, MD    Allergies:   Allergies  Allergen Reactions  . Sulfa Antibiotics Other (See Comments)    Both parents allergic-so will not take    Social History:  reports that she quit smoking about 23 years ago. Her smoking use included Cigarettes. She has never used smokeless tobacco. She reports that she does not drink alcohol or use illicit drugs.  Family History: Family History  Problem Relation Age of Onset  . Colon cancer Brother   . Cancer Brother   . Coronary artery disease Mother 62  . Hyperlipidemia Mother   . Hypertension Mother   . Esophageal cancer Neg Hx   . Stomach cancer Neg Hx   . Rectal cancer Neg Hx     Physical Exam: Filed Vitals:   05/08/15 2300 05/08/15 2315 05/08/15 2330 05/09/15 0119  BP: 178/91 178/94 169/95   Pulse:  70 73   Temp:    98 F (36.7 C)  TempSrc:      Resp: 14 15 21    SpO2:  100% 100%    General appearance: alert, cooperative and no distress Head: Normocephalic, without obvious abnormality, atraumatic Eyes: negative Nose: Nares normal. Septum midline. Mucosa normal. No drainage or sinus tenderness. Neck: no JVD and supple, symmetrical, trachea midline Lungs: clear to auscultation bilaterally Heart: regular rate and rhythm, S1, S2 normal, no murmur, click, rub or gallop Abdomen: soft, non-tender; bowel sounds normal; no masses,  no organomegaly Extremities: extremities normal, atraumatic, no cyanosis or edema Pulses: 2+ and symmetric Skin: Skin color, texture, turgor normal. No rashes or lesions Neurologic: Grossly normal  Cn 2-12 intact, 5/5 strength throughout.  parasthesia to lue and left leg.  Reflexes normal throughtout , gait not assessed.    Labs on Admission:   Recent Labs  05/08/15 1924  NA 139  K 4.2  CL 93*  CO2 27  GLUCOSE 152*  BUN 38*  CREATININE 6.75*  CALCIUM 9.5    Recent Labs  05/08/15 1924  AST 21  ALT 11*  ALKPHOS 94  BILITOT 0.4  PROT 8.0   ALBUMIN 3.4*    Recent Labs  05/08/15 1924  WBC 6.5  NEUTROABS 3.5  HGB 11.1*  HCT 35.2*  MCV 92.9  PLT 255   Radiological Exams on Admission: Ct Head Wo Contrast  05/09/2015   CLINICAL DATA:  Dizziness and diaphoresis onset today. Left-sided paresthesias  EXAM: CT HEAD WITHOUT CONTRAST  TECHNIQUE: Contiguous axial images were obtained from the base of the skull through the vertex without intravenous contrast.  COMPARISON:  07/28/2014  FINDINGS: Skull and Sinuses:Stable widening of the predental space with moderate canal stenosis.  This is new from 2006 cervical spine MRI. No acute fracture or destructive process. No sinusitis.  Orbits: Bilateral cataract resection.  No acute findings.  Brain: No evidence of acute infarction, hemorrhage, hydrocephalus, or mass lesion/mass effect.  There is chronic small vessel disease with patchy ischemic gliosis in the bilateral cerebral white matter. There is a new but chronic appearing lacunar infarct in the right corona radiata. A small ischemic insult in the left thalamus is chronic.  IMPRESSION: 1. No acute findings. 2. Chronic small vessel disease. A lacunar infarct in the right corona radiata has developed since 07/28/2014 but appears chronic. 3. 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 If there is chronic neck pain this could be evaluated with outpatient cervical spine imaging   Electronically Signed   By: Monte Fantasia M.D.   On: 05/09/2015 01:27   12 lead ekg reviewed by myself   Assessment/Plan  64 yo female with multiple risk factors now with symptoms concerning for a stroke/TIA  Principal Problem:   Left facial numbness-  Observe patient for full possible tia/cva work up.  Order 2 D echo, carotid dopplers and mri brain.  Place on aspirin.  Neurology called for consultation.  Obtain freq neuro checks overnight.  Monitor closely for any worsening neurological symptoms.  Monitor closely on  telemetry for any arrythmias.  Active Problems:   ESRD (end stage renal disease)-  No ivf at this time.  Noted.  T/thurs/sat   Anemia in CKD (chronic kidney disease)- stable   HTN (hypertension)-  Noted, allow permissive htn   Hyperlipidemia-  Ck flp in am   CHF (congestive heart failure)- compensated at this time, no evidence of failure at this time   Dizziness-  As above   Ataxia- as above   H/O: CVA (cerebrovascular accident)/ TIA in past   Left leg numbness- as above  Observe on telemetry floor.  FULL CODE.    Amro Winebarger A 05/09/2015, 1:52 AM

## 2015-05-09 NOTE — Procedures (Signed)
Patient was seen on dialysis and the procedure was supervised.  BFR 400  Via AVF BP is  150/76.   Patient appears to be tolerating treatment well  Yanet Balliet A 05/09/2015

## 2015-05-10 ENCOUNTER — Ambulatory Visit (HOSPITAL_BASED_OUTPATIENT_CLINIC_OR_DEPARTMENT_OTHER): Payer: Medicare Other

## 2015-05-10 ENCOUNTER — Observation Stay (HOSPITAL_COMMUNITY): Payer: Medicare Other

## 2015-05-10 DIAGNOSIS — G459 Transient cerebral ischemic attack, unspecified: Secondary | ICD-10-CM | POA: Diagnosis not present

## 2015-05-10 DIAGNOSIS — I120 Hypertensive chronic kidney disease with stage 5 chronic kidney disease or end stage renal disease: Secondary | ICD-10-CM | POA: Diagnosis not present

## 2015-05-10 DIAGNOSIS — R200 Anesthesia of skin: Secondary | ICD-10-CM | POA: Diagnosis not present

## 2015-05-10 DIAGNOSIS — R208 Other disturbances of skin sensation: Secondary | ICD-10-CM | POA: Diagnosis not present

## 2015-05-10 DIAGNOSIS — N2581 Secondary hyperparathyroidism of renal origin: Secondary | ICD-10-CM | POA: Diagnosis not present

## 2015-05-10 DIAGNOSIS — N186 End stage renal disease: Secondary | ICD-10-CM | POA: Diagnosis not present

## 2015-05-10 DIAGNOSIS — Z992 Dependence on renal dialysis: Secondary | ICD-10-CM | POA: Diagnosis not present

## 2015-05-10 LAB — HEMOGLOBIN A1C
Hgb A1c MFr Bld: 4.8 % (ref 4.8–5.6)
Mean Plasma Glucose: 91 mg/dL

## 2015-05-10 MED ORDER — ASPIRIN 325 MG PO TABS: 325.0000 mg | ORAL_TABLET | Freq: Every day | ORAL | Status: DC

## 2015-05-10 NOTE — Progress Notes (Signed)
Echocardiogram 2D Echocardiogram has been performed.  [name] 05/10/2015, 12:06 PM

## 2015-05-10 NOTE — Discharge Summary (Signed)
Physician Discharge Summary  [name] [id number] DOB: 1951/10/15 DOA: 05/08/2015  PCP: Kandice Hams, MD  Admit date: 05/08/2015 Discharge date: 05/10/2015  Time spent: > 35  minutes  Recommendations for Outpatient Follow-up:  1. Please follow-up with nephrology for routine dialysis 2. Patient's aspirin has been increased from 81 to 325 mg on discharge  Discharge Diagnoses:  Principal Problem:   Left facial numbness Active Problems:   ESRD (end stage renal disease)   Anemia in CKD (chronic kidney disease)   HTN (hypertension)   Hyperlipidemia   CHF (congestive heart failure)   Dizziness   Ataxia   H/O: CVA (cerebrovascular accident)   Left leg numbness   Discharge Condition: stable  Diet recommendation: renal with fluid restriction  Filed Weights   05/09/15 0357 05/09/15 1500 05/09/15 1937  Weight: 55.974 kg (123 lb 6.4 oz) 57.7 kg (127 lb 3.3 oz) 55.7 kg (122 lb 12.7 oz)    History of present illness:  From original history of present illness: 64 yo female h/o esrd, htn, chf, anemia comes in with several days of left facial numbness along with numbness and tingling in her leg arm and leg.  Hospital Course:  64 YO female with dizziness and chronic left leg decreased sensation.  - Neurology consulted while patient in house and workup negative. -We'll discharge patient on higher dose of aspirin  Procedures:  Please refer to EMR  Consultations:  Neurology  Discharge Exam: Filed Vitals:   05/10/15 1408  BP: 104/55  Pulse: 86  Temp: 98.2 F (36.8 C)  Resp: 16    General: Patient in no acute distress, alert and awake Cardiovascular: Regular rate and rhythm, no murmurs or rubs Respiratory: Clear to auscultation bilaterally, no wheezes  Discharge Instructions   Discharge Instructions    Call MD for:  redness, tenderness, or signs of infection (pain, swelling, redness, odor or green/yellow discharge around incision site)    Complete by:  As  directed      Call MD for:  temperature >100.4    Complete by:  As directed      Diet - low sodium heart healthy    Complete by:  As directed      Discharge instructions    Complete by:  As directed   Follow-up with your nephrologist for routine dialysis recommendations.   F/u with your pcp in 1-2 weeks or sooner should any new concerns arise     Increase activity slowly    Complete by:  As directed           Current Discharge Medication List    START taking these medications   Details  aspirin 325 MG tablet Take 1 tablet (325 mg total) by mouth daily. Qty: 30 tablet, Refills: 0      CONTINUE these medications which have NOT CHANGED   Details  allopurinol (ZYLOPRIM) 300 MG tablet Take 150 mg by mouth at bedtime.    ALPRAZolam (XANAX) 0.25 MG tablet Take 0.25 mg by mouth daily as needed for anxiety.    calcium carbonate (TUMS - DOSED IN MG ELEMENTAL CALCIUM) 500 MG chewable tablet Chew 1 tablet by mouth 3 (three) times daily as needed. For acid    cinacalcet (SENSIPAR) 30 MG tablet Take 30 mg by mouth daily.    doxazosin (CARDURA) 8 MG tablet Take 1 tablet (8 mg total) by mouth 2 (two) times daily. Qty: 60 tablet, Refills: 1    HYDROcodone-acetaminophen (NORCO) 5-325 MG per tablet Take 1 tablet  by mouth every 4 (four) hours as needed for moderate pain. Qty: 20 tablet, Refills: 0    labetalol (NORMODYNE) 300 MG tablet Take 300 mg by mouth 2 (two) times daily.    losartan (COZAAR) 100 MG tablet Take 100 mg by mouth at bedtime.     simvastatin (ZOCOR) 10 MG tablet Take 10 mg by mouth at bedtime.     sorbitol 70 % solution Take 60 mLs by mouth daily as needed (constipation).     zolpidem (AMBIEN) 10 MG tablet Take 10 mg by mouth at bedtime.    b complex-vitamin c-folic acid (NEPHRO-VITE) 0.8 MG TABS tablet Take 1 tablet by mouth at bedtime.    promethazine (PHENERGAN) 25 MG tablet Take 25 mg by mouth every 6 (six) hours as needed for nausea or vomiting.      STOP  taking these medications     aspirin EC 81 MG tablet      Multiple Vitamin (MULTIVITAMIN WITH MINERALS) TABS tablet        Allergies  Allergen Reactions  . Sulfa Antibiotics Other (See Comments)    Both parents allergic-so will not take      The results of significant diagnostics from this hospitalization (including imaging, microbiology, ancillary and laboratory) are listed below for reference.    Significant Diagnostic Studies: Ct Head Wo Contrast  05/09/2015   CLINICAL DATA:  Dizziness and diaphoresis onset today. Left-sided paresthesias  EXAM: CT HEAD WITHOUT CONTRAST  TECHNIQUE: Contiguous axial images were obtained from the base of the skull through the vertex without intravenous contrast.  COMPARISON:  07/28/2014  FINDINGS: Skull and Sinuses:Stable widening of the predental space with moderate canal stenosis. This is new from 2006 cervical spine MRI. No acute fracture or destructive process. No sinusitis.  Orbits: Bilateral cataract resection.  No acute findings.  Brain: No evidence of acute infarction, hemorrhage, hydrocephalus, or mass lesion/mass effect.  There is chronic small vessel disease with patchy ischemic gliosis in the bilateral cerebral white matter. There is a new but chronic appearing lacunar infarct in the right corona radiata. A small ischemic insult in the left thalamus is chronic.  IMPRESSION: 1. No acute findings. 2. Chronic small vessel disease. A lacunar infarct in the right corona radiata has developed since 07/28/2014 but appears chronic. 3. 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 If there is chronic neck pain this could be evaluated with outpatient cervical spine imaging   Electronically Signed   By: Monte Fantasia M.D.   On: 05/09/2015 01:27   Mr Brain Wo Contrast  05/09/2015   CLINICAL DATA:  TIA. Left facial numbness. Left arm and leg numbness and tingling. Dizziness. Leaning to left.  EXAM: MRI HEAD  WITHOUT CONTRAST  MRA HEAD WITHOUT CONTRAST  TECHNIQUE: Multiplanar, multiecho pulse sequences of the brain and surrounding structures were obtained without intravenous contrast. Angiographic images of the head were obtained using MRA technique without contrast.  COMPARISON:  CT head 05/09/2015  FINDINGS: MRI HEAD FINDINGS  Negative for acute infarct.  Ventricle size normal. Mild age-appropriate atrophy. Chronic microvascular ischemic change in the white matter bilaterally. No cortical infarct.  Pituitary normal in size. Cervical medullary junction normal. Mild mucosal edema paranasal sinuses.  Multiple areas of micro hemorrhage in the brain including the thalamus bilaterally. Chronic hemorrhage in the dentate nucleus of the cerebellum bilaterally. This causes some artifact on diffusion-weighted imaging.  Negative for mass or edema.  No shift of the midline structures  Image quality degraded by mild motion.  MRA HEAD FINDINGS  Both vertebral arteries patent to the basilar without stenosis. PICA patent bilaterally. Basilar widely patent. Superior cerebellar and posterior cerebral arteries appear normal  Cavernous carotid widely patent bilaterally. Anterior and middle cerebral arteries widely patent without stenosis  Negative for cerebral aneurysm.  IMPRESSION: Negative for acute infarct.  Chronic microvascular ischemic change in the cerebral white matter bilaterally.  Scattered areas of chronic micro hemorrhage in the brain bilaterally, question history of uncontrolled hypertension.  Negative MRA circle Willis.   Electronically Signed   By: Franchot Gallo M.D.   On: 05/09/2015 08:16   Mr Jodene Nam Head/brain Wo Cm  05/09/2015   CLINICAL DATA:  TIA. Left facial numbness. Left arm and leg numbness and tingling. Dizziness. Leaning to left.  EXAM: MRI HEAD WITHOUT CONTRAST  MRA HEAD WITHOUT CONTRAST  TECHNIQUE: Multiplanar, multiecho pulse sequences of the brain and surrounding structures were obtained without intravenous  contrast. Angiographic images of the head were obtained using MRA technique without contrast.  COMPARISON:  CT head 05/09/2015  FINDINGS: MRI HEAD FINDINGS  Negative for acute infarct.  Ventricle size normal. Mild age-appropriate atrophy. Chronic microvascular ischemic change in the white matter bilaterally. No cortical infarct.  Pituitary normal in size. Cervical medullary junction normal. Mild mucosal edema paranasal sinuses.  Multiple areas of micro hemorrhage in the brain including the thalamus bilaterally. Chronic hemorrhage in the dentate nucleus of the cerebellum bilaterally. This causes some artifact on diffusion-weighted imaging.  Negative for mass or edema.  No shift of the midline structures  Image quality degraded by mild motion.  MRA HEAD FINDINGS  Both vertebral arteries patent to the basilar without stenosis. PICA patent bilaterally. Basilar widely patent. Superior cerebellar and posterior cerebral arteries appear normal  Cavernous carotid widely patent bilaterally. Anterior and middle cerebral arteries widely patent without stenosis  Negative for cerebral aneurysm.  IMPRESSION: Negative for acute infarct.  Chronic microvascular ischemic change in the cerebral white matter bilaterally.  Scattered areas of chronic micro hemorrhage in the brain bilaterally, question history of uncontrolled hypertension.  Negative MRA circle Willis.   Electronically Signed   By: Franchot Gallo M.D.   On: 05/09/2015 08:16    Microbiology: Recent Results (from the past 240 hour(s))  Surgical PCR screen     Status: None   Collection Time: 05/09/15  2:37 PM  Result Value Ref Range Status   MRSA, PCR NEGATIVE NEGATIVE Final   Staphylococcus aureus NEGATIVE NEGATIVE Final    Comment:        The Xpert SA Assay (FDA approved for NASAL specimens in patients over 31 years of age), is one component of a comprehensive surveillance program.  Test performance has been validated by [facility] for patients  greater than or equal to 86 year old. It is not intended to diagnose infection nor to guide or monitor treatment.      Labs: Basic Metabolic Panel:  Recent Labs Lab 05/08/15 1924  NA 139  K 4.2  CL 93*  CO2 27  GLUCOSE 152*  BUN 38*  CREATININE 6.75*  CALCIUM 9.5   Liver Function Tests:  Recent Labs Lab 05/08/15 1924  AST 21  ALT 11*  ALKPHOS 94  BILITOT 0.4  PROT 8.0  ALBUMIN 3.4*   No results for input(s): LIPASE, AMYLASE in the last 168 hours. No results for input(s): AMMONIA in the last 168 hours. CBC:  Recent Labs Lab 05/08/15 1924  WBC 6.5  NEUTROABS 3.5  HGB 11.1*  HCT 35.2*  MCV 92.9  PLT 255   Cardiac Enzymes: No results for input(s): CKTOTAL, CKMB, CKMBINDEX, TROPONINI in the last 168 hours. BNP: BNP (last 3 results)  Recent Labs  01/22/15 0057  BNP 1438.6*    ProBNP (last 3 results)  Recent Labs  07/31/14 0450  PROBNP >70000.0*    CBG:  Recent Labs Lab 05/08/15 1914  GLUCAP 146*       Signed:  Velvet Bathe  [facility] 05/10/2015, 3:35 PM

## 2015-05-10 NOTE — Progress Notes (Signed)
*  PRELIMINARY RESULTS* Vascular Ultrasound Carotid Duplex (Doppler) has been completed.   Findings suggest 1-39% internal carotid artery stenosis bilaterally. Vertebral arteries are patent with antegrade flow.  05/10/2015 10:30 AM Maudry Mayhew, RVT, RDCS, RDMS

## 2015-05-10 NOTE — Evaluation (Signed)
Physical Therapy Evaluation Patient Details Name: [name] MRN: [id number] DOB: Jul 07, 1951 Today's Date: 05/10/2015   History of Present Illness  Pt is a 64 yo female admitted for L arm numbness and weakness. MRI was negative.  Numbness and weakness has resolved.  Pt with PMH of ESRD, kidney transplant in 1996, on list for another transplant, back surgery, CHF, HTN and a chole.  White matter changes noted on MRI but nothing acute.    Clinical Impression  Pt doing well with mobility and no further PT needed.  Ready for dc from PT standpoint. Feel pt needs to follow up with primary MD concerning cognitive issues noted in OT note and question pt's ability to care for small children and to drive given cognitive issues.      Follow Up Recommendations No PT follow up    Equipment Recommendations  None recommended by PT    Recommendations for Other Services       Precautions / Restrictions Precautions Precautions: Fall Precaution Comments: Pt feels like her balance is back to baseline. Restrictions Weight Bearing Restrictions: No      Mobility  Bed Mobility Overal bed mobility: Independent             General bed mobility comments: No assist getting out of bed.  Transfers Overall transfer level: Independent Equipment used: None             General transfer comment: Pt required no physical assist for mobiltiy and appeared safe at all times.  Do have concerns with this pt taking care of and lifting small children.  Pt has to bend over, pick things from floor and be very agile which is concerning .  Ambulation/Gait Ambulation/Gait assistance: Independent Ambulation Distance (Feet): 600 Feet Assistive device: None     Gait velocity interpretation: at or above normal speed for age/gender General Gait Details: Able to turn 360, pick object off of floor.  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    Prior Function Level of Independence: Independent         Comments: Pt has reported some cognitive changes over last few months.  Pt had neuropsych testing at [facility] last summer and said "she did fine except for she has trouble with her comprehension."     Hand Dominance   Dominant Hand: Right    Extremity/Trunk Assessment   Upper Extremity Assessment: Defer to OT evaluation           Lower Extremity Assessment: Overall WFL for tasks assessed      Cervical / Trunk Assessment: Normal  Communication   Communication: No difficulties  Cognition Arousal/Alertness: Awake/alert Behavior During Therapy: WFL for tasks assessed/performed Overall  Cognitive Status: No family/caregiver present to determine baseline cognitive functioning Area of Impairment: Attention;Memory;Safety/judgement;Awareness;Problem solving   Current Attention Level: Selective Memory: Decreased recall of precautions;Decreased short-term memory   Safety/Judgement: Decreased awareness of deficits;Decreased awareness of safety Awareness: Emergent Problem Solving: Slow processing;Difficulty sequencing      General Comments      Exercises        Assessment/Plan    PT Assessment Patent does not need any further PT services  PT Diagnosis Difficulty walking   PT Problem List    PT Treatment Interventions     PT Goals (Current  goals can be found in the Care Plan section) Acute Rehab PT Goals Patient Stated Goal: to get out of here.    Frequency     Barriers to discharge        Co-evaluation               End of Session   Activity Tolerance: Patient tolerated treatment well Patient left: in bed;with call bell/phone within reach      Functional Assessment Tool Used: clinical judgement Functional Limitation: Mobility: Walking and moving around Mobility: Walking and Moving Around Current Status [phone]): 0 percent impaired, limited or restricted Mobility: Walking and Moving Around Goal Status [id number]): 0 percent impaired, limited or restricted Mobility: Walking and Moving Around Discharge Status [phone]): 0 percent impaired, limited or restricted    Time: [id number] PT Time Calculation (min) (ACUTE ONLY): 8 min   Charges:   PT Evaluation $Initial PT Evaluation Tier I: 1 Procedure     PT G Codes:   PT G-Codes **NOT FOR INPATIENT CLASS** Functional Assessment Tool Used: clinical judgement Functional Limitation: Mobility: Walking and moving around Mobility: Walking and Moving Around Current Status [id number]): 0 percent impaired, limited or restricted Mobility: Walking and Moving Around Goal Status [id number]): 0 percent impaired, limited or restricted Mobility: Walking and Moving Around Discharge Status [id number]): 0 percent impaired, limited or restricted    [facility] 05/10/2015, 3:39 PM  [facility] PT [phone]

## 2015-05-10 NOTE — Evaluation (Signed)
Occupational Therapy Evaluation and Discharge Summary Patient Details Name: [name] MRN: [id number] DOB: [birth year] Today's Date: 05/10/2015    History of Present Illness Pt is a 64 yo female admitted for L arm numbness and weakness. MRI was negative.  Numbness and weakness has resolved.  Pt with PMH of ESRD, kidney transplant in 1996, on list for another transplant, back surgery, CHF, HTN and a chole.  White matter changes noted on MRI but nothing acute.     Clinical Impression   Pt admitted with the above diagnosis and has the deficits listed below. Pt would benefit from cont OPOT to address some cognitive concerns and maybe a follow up with her family practice doc to further investigate these cognitive issues that could impact her job and driving.  Pt with difficulty with comprehension, problem solving, safety with decisions and attn span.  Rec OPOT address these issues due to he occupation, the fact that she lives alone and drives.    Follow Up Recommendations  Outpatient OT;Supervision - Intermittent    Equipment Recommendations  None recommended by OT    Recommendations for Other Services       Precautions / Restrictions Precautions Precautions: Fall Precaution Comments: Pt feels like her balance is back to baseline. Restrictions Weight Bearing Restrictions: No      Mobility Bed Mobility Overal bed mobility: Independent             General bed mobility comments: No assist getting out of bed.  Transfers Overall transfer level: Independent Equipment used: None             General transfer comment: Pt required no physical assist for mobiltiy and appeared safe at all times.  Do have concerns with this pt taking care of and lifting small children.  Pt has to bend over, pick things from floor and be very agile which is concerning .    Balance Overall balance assessment: Modified Independent                                           ADL Overall ADL's : At baseline                                       General ADL Comments: Pt appears to be at baseline with basic self care in areas of bathing, grooming, dressing, toilteting.  I feel pt needs to be evaluated further at OPOT to address money management, cooking, problem solving and ultimately driving.     Vision Vision Assessment?: No apparent visual deficits   Perception     Praxis      Pertinent Vitals/Pain Pain Assessment: No/denies pain     Hand Dominance Right   Extremity/Trunk Assessment Upper Extremity Assessment Upper Extremity Assessment: Overall WFL for tasks assessed   Lower Extremity Assessment Lower Extremity Assessment: Defer to PT evaluation   Cervical / Trunk Assessment Cervical / Trunk Assessment: Normal   Communication Communication Communication: No difficulties   Cognition Arousal/Alertness: Awake/alert Behavior During Therapy: WFL for tasks assessed/performed Overall Cognitive Status: Impaired/Different from baseline Area of Impairment: Attention;Memory;Safety/judgement;Awareness;Problem solving   Current Attention Level: Selective Memory: Decreased recall of precautions;Decreased short-term memory   Safety/Judgement: Decreased awareness of deficits;Decreased awareness of safety Awareness: Emergent Problem Solving: Slow processing;Difficulty sequencing General Comments: Pt jumps  from one subject to another very quickly and has a hard time carrying on a fluent conversation during eval.  Pt was able to recall basic orientation information.  Pt felt it was okay to be caring for 4 children when the room was spinning and she had trouble standing.  Pt thought it was fine to drive herself to the ER when dizzy (parent of daycare student took her).  Pt with very little insight to what is safe and what is not safe.   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  Lives With: Alone    Prior Functioning/Environment Level of Independence: Independent        Comments: Pt has reported some cognitive changes over last few months.  Pt had neuropsych testing at [facility] last summer and said "she did fine except for she has trouble with her comprehension."    OT Diagnosis: Cognitive deficits   OT Problem List: Impaired balance (sitting and/or standing);Decreased knowledge of use of DME or AE;Decreased cognition   OT Treatment/Interventions:      OT Goals(Current goals can be found in the care plan section) Acute Rehab OT Goals Patient Stated Goal: to get out of here.  OT Frequency:     Barriers to D/C:            Co-evaluation              End of Session Nurse Communication: Mobility status  Activity Tolerance: Patient tolerated treatment well Patient left: in chair;with call bell/phone within reach;with family/visitor present   Time: [id number] OT Time Calculation (min): 29 min Charges:  OT General Charges $OT Visit: 1 Procedure OT Evaluation $Initial OT Evaluation Tier I: 1 Procedure OT Treatments $Self Care/Home Management : 8-22 mins G-Codes: OT G-codes **NOT FOR INPATIENT CLASS** Functional Assessment Tool Used: clinical judgement Functional Limitation: Self care Self Care Current Status [id number]): At least 1 percent but less than 20 percent impaired, limited or restricted Self Care Goal Status [id number]):  At least 1 percent but less than 20 percent impaired, limited or restricted Self Care Discharge Status [phone]): At least 1 percent but less than 20 percent impaired, limited or restricted  Glenford Peers 05/10/2015, 9:46 AM  [phone]

## 2015-05-10 NOTE — Progress Notes (Signed)
Subjective:   Denies any further dizziness.  Reports ongoing memory issues since last year  Objective Filed Vitals:   05/09/15 2045 05/10/15 0038 05/10/15 0455 05/10/15 0832  BP: 129/76 115/58 127/71 139/82  Pulse: 86 69 65 76  Temp: 98.6 F (37 C) 98.1 F (36.7 C) 97.9 F (36.6 C) 97.9 F (36.6 C)  TempSrc: Oral Oral Oral Oral  Resp: 17 14 17 16   Height:      Weight:      SpO2: 100% 97% 100% 100%   Physical Exam General: alert and oriented. No acute distress.  Heart: RRR  Lungs: CTA, unlabored  Abdomen: soft, nontender +BS Extremities: no edema Dialysis Access:  L AVF +b.t  Dialysis Orders: [facility] 4 hr 350/A 1.5 2 K 2 Ca EDW 56.5 heparin 3000 Mircera 100 last given 5/24 Calcitriol 1 Recent labs: Hgb 9.6 5/19 24% sat with ferritin 1830 in April iPTH 821 April   Assessment/Plan: 1. Dizziness - Resolved. Neuro seeing; meclizine helped. CT/MRI - no acute finds, prior infarcts, troponins. Stroke workup negative. Cont daily ASA 2. ESRD - TTS - HD tomorrow, need labs 3. Hypertension/volume -139/82  no volume excess/ under edw. May need lowered at DC 4. Anemia - Hgb 11.1 Up from 9.6 5/19 - Mircera given earlier this week - not on IV Fe 5. Metabolic bone disease - continue calcitriol - iPTH 821 - calcitriol recently increased- resume sensipar and tums 6. Nutrition - renal diet + vitamin 7. Anxiety/depression - worsening by recent financial stressors -excessive spending- needs to follow up with PCP at [place], [place] [phone] 05/10/2015,9:48 AM     Additional Objective Labs: Basic Metabolic Panel:  Recent Labs Lab 05/08/15 1924  NA 139  K 4.2  CL 93*  CO2 27  GLUCOSE 152*  BUN 38*  CREATININE 6.75*  CALCIUM 9.5   Liver Function Tests:  Recent Labs Lab 05/08/15 1924  AST 21  ALT 11*  ALKPHOS 94  BILITOT 0.4  PROT 8.0  ALBUMIN 3.4*   No results for input(s): LIPASE, AMYLASE in the last 168  hours. CBC:  Recent Labs Lab 05/08/15 1924  WBC 6.5  NEUTROABS 3.5  HGB 11.1*  HCT 35.2*  MCV 92.9  PLT 255   Blood Culture    Component Value Date/Time   SDES BLOOD RIGHT HAND 07/31/2014 0555   SPECREQUEST BOTTLES DRAWN AEROBIC AND ANAEROBIC 5CC EA 07/31/2014 0555   CULT  07/31/2014 0555    NO GROWTH 5 DAYS Performed at [place] 08/06/2014 FINAL 07/31/2014 0555    Cardiac Enzymes: No results for input(s): CKTOTAL, CKMB, CKMBINDEX, TROPONINI in the last 168 hours. CBG:  Recent Labs Lab 05/08/15 1914  GLUCAP 146*   Iron Studies: No results for input(s): IRON, TIBC, TRANSFERRIN, FERRITIN in the last 72 hours. @lablastinr3 @ Studies/Results: Ct Head Wo Contrast  05/09/2015   CLINICAL DATA:  Dizziness and diaphoresis onset today. Left-sided paresthesias  EXAM: CT HEAD WITHOUT CONTRAST  TECHNIQUE: Contiguous axial images were obtained from the base of the skull through the vertex without intravenous contrast.  COMPARISON:  07/28/2014  FINDINGS: Skull and Sinuses:Stable widening of the predental space with moderate canal stenosis. This is new from 2006 cervical spine MRI. No acute fracture or destructive process. No sinusitis.  Orbits: Bilateral cataract resection.  No acute findings.  Brain: No evidence of acute infarction, hemorrhage, hydrocephalus, or mass lesion/mass effect.  There is chronic small vessel disease  with patchy ischemic gliosis in the bilateral cerebral white matter. There is a new but chronic appearing lacunar infarct in the right corona radiata. A small ischemic insult in the left thalamus is chronic.  IMPRESSION: 1. No acute findings. 2. Chronic small vessel disease. A lacunar infarct in the right corona radiata has developed since 07/28/2014 but appears chronic. 3. 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 If there is chronic neck pain this could be evaluated with outpatient cervical spine  imaging   Electronically Signed   By: Monte Fantasia M.D.   On: 05/09/2015 01:27   Mr Brain Wo Contrast  05/09/2015   CLINICAL DATA:  TIA. Left facial numbness. Left arm and leg numbness and tingling. Dizziness. Leaning to left.  EXAM: MRI HEAD WITHOUT CONTRAST  MRA HEAD WITHOUT CONTRAST  TECHNIQUE: Multiplanar, multiecho pulse sequences of the brain and surrounding structures were obtained without intravenous contrast. Angiographic images of the head were obtained using MRA technique without contrast.  COMPARISON:  CT head 05/09/2015  FINDINGS: MRI HEAD FINDINGS  Negative for acute infarct.  Ventricle size normal. Mild age-appropriate atrophy. Chronic microvascular ischemic change in the white matter bilaterally. No cortical infarct.  Pituitary normal in size. Cervical medullary junction normal. Mild mucosal edema paranasal sinuses.  Multiple areas of micro hemorrhage in the brain including the thalamus bilaterally. Chronic hemorrhage in the dentate nucleus of the cerebellum bilaterally. This causes some artifact on diffusion-weighted imaging.  Negative for mass or edema.  No shift of the midline structures  Image quality degraded by mild motion.  MRA HEAD FINDINGS  Both vertebral arteries patent to the basilar without stenosis. PICA patent bilaterally. Basilar widely patent. Superior cerebellar and posterior cerebral arteries appear normal  Cavernous carotid widely patent bilaterally. Anterior and middle cerebral arteries widely patent without stenosis  Negative for cerebral aneurysm.  IMPRESSION: Negative for acute infarct.  Chronic microvascular ischemic change in the cerebral white matter bilaterally.  Scattered areas of chronic micro hemorrhage in the brain bilaterally, question history of uncontrolled hypertension.  Negative MRA circle Willis.   Electronically Signed   By: Franchot Gallo M.D.   On: 05/09/2015 08:16   Mr Jodene Nam Head/brain Wo Cm  05/09/2015   CLINICAL DATA:  TIA. Left facial numbness. Left  arm and leg numbness and tingling. Dizziness. Leaning to left.  EXAM: MRI HEAD WITHOUT CONTRAST  MRA HEAD WITHOUT CONTRAST  TECHNIQUE: Multiplanar, multiecho pulse sequences of the brain and surrounding structures were obtained without intravenous contrast. Angiographic images of the head were obtained using MRA technique without contrast.  COMPARISON:  CT head 05/09/2015  FINDINGS: MRI HEAD FINDINGS  Negative for acute infarct.  Ventricle size normal. Mild age-appropriate atrophy. Chronic microvascular ischemic change in the white matter bilaterally. No cortical infarct.  Pituitary normal in size. Cervical medullary junction normal. Mild mucosal edema paranasal sinuses.  Multiple areas of micro hemorrhage in the brain including the thalamus bilaterally. Chronic hemorrhage in the dentate nucleus of the cerebellum bilaterally. This causes some artifact on diffusion-weighted imaging.  Negative for mass or edema.  No shift of the midline structures  Image quality degraded by mild motion.  MRA HEAD FINDINGS  Both vertebral arteries patent to the basilar without stenosis. PICA patent bilaterally. Basilar widely patent. Superior cerebellar and posterior cerebral arteries appear normal  Cavernous carotid widely patent bilaterally. Anterior and middle cerebral arteries widely patent without stenosis  Negative for cerebral aneurysm.  IMPRESSION: Negative for acute infarct.  Chronic  microvascular ischemic change in the cerebral white matter bilaterally.  Scattered areas of chronic micro hemorrhage in the brain bilaterally, question history of uncontrolled hypertension.  Negative MRA circle Willis.   Electronically Signed   By: Franchot Gallo M.D.   On: 05/09/2015 08:16   Medications:   . aspirin  300 mg Rectal Daily   Or  . aspirin  325 mg Oral Daily  . calcitRIOL  1 mcg Oral Q T,Th,Sa-HD  . calcium carbonate  4 tablet Oral TID WC  . cinacalcet  30 mg Oral Q supper  . doxazosin  8 mg Oral BID  . labetalol  300 mg  Oral BID  . losartan  100 mg Oral QHS  . multivitamin  1 tablet Oral QHS  . simvastatin  10 mg Oral QHS  . sodium chloride  3 mL Intravenous Q12H

## 2015-05-11 DIAGNOSIS — N186 End stage renal disease: Secondary | ICD-10-CM | POA: Diagnosis not present

## 2015-05-11 DIAGNOSIS — D509 Iron deficiency anemia, unspecified: Secondary | ICD-10-CM | POA: Diagnosis not present

## 2015-05-11 DIAGNOSIS — N2581 Secondary hyperparathyroidism of renal origin: Secondary | ICD-10-CM | POA: Diagnosis not present

## 2015-05-11 DIAGNOSIS — D631 Anemia in chronic kidney disease: Secondary | ICD-10-CM | POA: Diagnosis not present

## 2015-05-14 DIAGNOSIS — D631 Anemia in chronic kidney disease: Secondary | ICD-10-CM | POA: Diagnosis not present

## 2015-05-14 DIAGNOSIS — D509 Iron deficiency anemia, unspecified: Secondary | ICD-10-CM | POA: Diagnosis not present

## 2015-05-14 DIAGNOSIS — Z992 Dependence on renal dialysis: Secondary | ICD-10-CM | POA: Diagnosis not present

## 2015-05-14 DIAGNOSIS — T8612 Kidney transplant failure: Secondary | ICD-10-CM | POA: Diagnosis not present

## 2015-05-14 DIAGNOSIS — N186 End stage renal disease: Secondary | ICD-10-CM | POA: Diagnosis not present

## 2015-05-14 DIAGNOSIS — N2581 Secondary hyperparathyroidism of renal origin: Secondary | ICD-10-CM | POA: Diagnosis not present

## 2015-05-16 DIAGNOSIS — D509 Iron deficiency anemia, unspecified: Secondary | ICD-10-CM | POA: Diagnosis not present

## 2015-05-16 DIAGNOSIS — N186 End stage renal disease: Secondary | ICD-10-CM | POA: Diagnosis not present

## 2015-05-16 DIAGNOSIS — N2581 Secondary hyperparathyroidism of renal origin: Secondary | ICD-10-CM | POA: Diagnosis not present

## 2015-05-16 DIAGNOSIS — D631 Anemia in chronic kidney disease: Secondary | ICD-10-CM | POA: Diagnosis not present

## 2015-05-18 DIAGNOSIS — N186 End stage renal disease: Secondary | ICD-10-CM | POA: Diagnosis not present

## 2015-05-18 DIAGNOSIS — N2581 Secondary hyperparathyroidism of renal origin: Secondary | ICD-10-CM | POA: Diagnosis not present

## 2015-05-18 DIAGNOSIS — D509 Iron deficiency anemia, unspecified: Secondary | ICD-10-CM | POA: Diagnosis not present

## 2015-05-18 DIAGNOSIS — D631 Anemia in chronic kidney disease: Secondary | ICD-10-CM | POA: Diagnosis not present

## 2015-05-20 ENCOUNTER — Other Ambulatory Visit: Payer: Self-pay | Admitting: Gynecology

## 2015-05-20 DIAGNOSIS — M816 Localized osteoporosis [Lequesne]: Secondary | ICD-10-CM | POA: Diagnosis not present

## 2015-05-20 DIAGNOSIS — Z1212 Encounter for screening for malignant neoplasm of rectum: Secondary | ICD-10-CM | POA: Diagnosis not present

## 2015-05-20 DIAGNOSIS — Z6823 Body mass index (BMI) 23.0-23.9, adult: Secondary | ICD-10-CM | POA: Diagnosis not present

## 2015-05-20 DIAGNOSIS — Z124 Encounter for screening for malignant neoplasm of cervix: Secondary | ICD-10-CM | POA: Diagnosis not present

## 2015-05-20 DIAGNOSIS — N958 Other specified menopausal and perimenopausal disorders: Secondary | ICD-10-CM | POA: Diagnosis not present

## 2015-05-20 DIAGNOSIS — N760 Acute vaginitis: Secondary | ICD-10-CM | POA: Diagnosis not present

## 2015-05-21 DIAGNOSIS — N186 End stage renal disease: Secondary | ICD-10-CM | POA: Diagnosis not present

## 2015-05-21 DIAGNOSIS — D631 Anemia in chronic kidney disease: Secondary | ICD-10-CM | POA: Diagnosis not present

## 2015-05-21 DIAGNOSIS — N2581 Secondary hyperparathyroidism of renal origin: Secondary | ICD-10-CM | POA: Diagnosis not present

## 2015-05-21 DIAGNOSIS — D509 Iron deficiency anemia, unspecified: Secondary | ICD-10-CM | POA: Diagnosis not present

## 2015-05-21 LAB — CYTOLOGY - PAP

## 2015-05-23 DIAGNOSIS — N186 End stage renal disease: Secondary | ICD-10-CM | POA: Diagnosis not present

## 2015-05-23 DIAGNOSIS — D509 Iron deficiency anemia, unspecified: Secondary | ICD-10-CM | POA: Diagnosis not present

## 2015-05-23 DIAGNOSIS — N2581 Secondary hyperparathyroidism of renal origin: Secondary | ICD-10-CM | POA: Diagnosis not present

## 2015-05-23 DIAGNOSIS — D631 Anemia in chronic kidney disease: Secondary | ICD-10-CM | POA: Diagnosis not present

## 2015-05-25 DIAGNOSIS — D509 Iron deficiency anemia, unspecified: Secondary | ICD-10-CM | POA: Diagnosis not present

## 2015-05-25 DIAGNOSIS — D631 Anemia in chronic kidney disease: Secondary | ICD-10-CM | POA: Diagnosis not present

## 2015-05-25 DIAGNOSIS — N186 End stage renal disease: Secondary | ICD-10-CM | POA: Diagnosis not present

## 2015-05-25 DIAGNOSIS — N2581 Secondary hyperparathyroidism of renal origin: Secondary | ICD-10-CM | POA: Diagnosis not present

## 2015-05-28 DIAGNOSIS — D509 Iron deficiency anemia, unspecified: Secondary | ICD-10-CM | POA: Diagnosis not present

## 2015-05-28 DIAGNOSIS — N2581 Secondary hyperparathyroidism of renal origin: Secondary | ICD-10-CM | POA: Diagnosis not present

## 2015-05-28 DIAGNOSIS — N186 End stage renal disease: Secondary | ICD-10-CM | POA: Diagnosis not present

## 2015-05-28 DIAGNOSIS — D631 Anemia in chronic kidney disease: Secondary | ICD-10-CM | POA: Diagnosis not present

## 2015-05-29 DIAGNOSIS — S52501D Unspecified fracture of the lower end of right radius, subsequent encounter for closed fracture with routine healing: Secondary | ICD-10-CM | POA: Diagnosis not present

## 2015-05-30 DIAGNOSIS — N2581 Secondary hyperparathyroidism of renal origin: Secondary | ICD-10-CM | POA: Diagnosis not present

## 2015-05-30 DIAGNOSIS — D631 Anemia in chronic kidney disease: Secondary | ICD-10-CM | POA: Diagnosis not present

## 2015-05-30 DIAGNOSIS — N186 End stage renal disease: Secondary | ICD-10-CM | POA: Diagnosis not present

## 2015-05-30 DIAGNOSIS — D509 Iron deficiency anemia, unspecified: Secondary | ICD-10-CM | POA: Diagnosis not present

## 2015-06-01 DIAGNOSIS — D509 Iron deficiency anemia, unspecified: Secondary | ICD-10-CM | POA: Diagnosis not present

## 2015-06-01 DIAGNOSIS — N2581 Secondary hyperparathyroidism of renal origin: Secondary | ICD-10-CM | POA: Diagnosis not present

## 2015-06-01 DIAGNOSIS — D631 Anemia in chronic kidney disease: Secondary | ICD-10-CM | POA: Diagnosis not present

## 2015-06-01 DIAGNOSIS — N186 End stage renal disease: Secondary | ICD-10-CM | POA: Diagnosis not present

## 2015-06-04 DIAGNOSIS — D509 Iron deficiency anemia, unspecified: Secondary | ICD-10-CM | POA: Diagnosis not present

## 2015-06-04 DIAGNOSIS — N2581 Secondary hyperparathyroidism of renal origin: Secondary | ICD-10-CM | POA: Diagnosis not present

## 2015-06-04 DIAGNOSIS — N186 End stage renal disease: Secondary | ICD-10-CM | POA: Diagnosis not present

## 2015-06-04 DIAGNOSIS — D631 Anemia in chronic kidney disease: Secondary | ICD-10-CM | POA: Diagnosis not present

## 2015-06-06 DIAGNOSIS — D509 Iron deficiency anemia, unspecified: Secondary | ICD-10-CM | POA: Diagnosis not present

## 2015-06-06 DIAGNOSIS — D631 Anemia in chronic kidney disease: Secondary | ICD-10-CM | POA: Diagnosis not present

## 2015-06-06 DIAGNOSIS — N2581 Secondary hyperparathyroidism of renal origin: Secondary | ICD-10-CM | POA: Diagnosis not present

## 2015-06-06 DIAGNOSIS — N186 End stage renal disease: Secondary | ICD-10-CM | POA: Diagnosis not present

## 2015-06-08 DIAGNOSIS — N2581 Secondary hyperparathyroidism of renal origin: Secondary | ICD-10-CM | POA: Diagnosis not present

## 2015-06-08 DIAGNOSIS — D631 Anemia in chronic kidney disease: Secondary | ICD-10-CM | POA: Diagnosis not present

## 2015-06-08 DIAGNOSIS — D509 Iron deficiency anemia, unspecified: Secondary | ICD-10-CM | POA: Diagnosis not present

## 2015-06-08 DIAGNOSIS — N186 End stage renal disease: Secondary | ICD-10-CM | POA: Diagnosis not present

## 2015-06-11 DIAGNOSIS — D631 Anemia in chronic kidney disease: Secondary | ICD-10-CM | POA: Diagnosis not present

## 2015-06-11 DIAGNOSIS — D509 Iron deficiency anemia, unspecified: Secondary | ICD-10-CM | POA: Diagnosis not present

## 2015-06-11 DIAGNOSIS — N186 End stage renal disease: Secondary | ICD-10-CM | POA: Diagnosis not present

## 2015-06-11 DIAGNOSIS — N2581 Secondary hyperparathyroidism of renal origin: Secondary | ICD-10-CM | POA: Diagnosis not present

## 2015-06-13 DIAGNOSIS — N2581 Secondary hyperparathyroidism of renal origin: Secondary | ICD-10-CM | POA: Diagnosis not present

## 2015-06-13 DIAGNOSIS — N186 End stage renal disease: Secondary | ICD-10-CM | POA: Diagnosis not present

## 2015-06-13 DIAGNOSIS — D631 Anemia in chronic kidney disease: Secondary | ICD-10-CM | POA: Diagnosis not present

## 2015-06-13 DIAGNOSIS — D509 Iron deficiency anemia, unspecified: Secondary | ICD-10-CM | POA: Diagnosis not present

## 2015-06-13 DIAGNOSIS — T8612 Kidney transplant failure: Secondary | ICD-10-CM | POA: Diagnosis not present

## 2015-06-13 DIAGNOSIS — Z992 Dependence on renal dialysis: Secondary | ICD-10-CM | POA: Diagnosis not present

## 2015-06-15 DIAGNOSIS — N2581 Secondary hyperparathyroidism of renal origin: Secondary | ICD-10-CM | POA: Diagnosis not present

## 2015-06-15 DIAGNOSIS — D509 Iron deficiency anemia, unspecified: Secondary | ICD-10-CM | POA: Diagnosis not present

## 2015-06-15 DIAGNOSIS — D631 Anemia in chronic kidney disease: Secondary | ICD-10-CM | POA: Diagnosis not present

## 2015-06-15 DIAGNOSIS — N186 End stage renal disease: Secondary | ICD-10-CM | POA: Diagnosis not present

## 2015-06-18 ENCOUNTER — Encounter: Payer: Self-pay | Admitting: Internal Medicine

## 2015-06-18 DIAGNOSIS — D509 Iron deficiency anemia, unspecified: Secondary | ICD-10-CM | POA: Diagnosis not present

## 2015-06-18 DIAGNOSIS — D631 Anemia in chronic kidney disease: Secondary | ICD-10-CM | POA: Diagnosis not present

## 2015-06-18 DIAGNOSIS — N186 End stage renal disease: Secondary | ICD-10-CM | POA: Diagnosis not present

## 2015-06-18 DIAGNOSIS — N2581 Secondary hyperparathyroidism of renal origin: Secondary | ICD-10-CM | POA: Diagnosis not present

## 2015-06-20 DIAGNOSIS — N2581 Secondary hyperparathyroidism of renal origin: Secondary | ICD-10-CM | POA: Diagnosis not present

## 2015-06-20 DIAGNOSIS — N186 End stage renal disease: Secondary | ICD-10-CM | POA: Diagnosis not present

## 2015-06-20 DIAGNOSIS — D631 Anemia in chronic kidney disease: Secondary | ICD-10-CM | POA: Diagnosis not present

## 2015-06-20 DIAGNOSIS — D509 Iron deficiency anemia, unspecified: Secondary | ICD-10-CM | POA: Diagnosis not present

## 2015-06-21 DIAGNOSIS — D631 Anemia in chronic kidney disease: Secondary | ICD-10-CM | POA: Diagnosis not present

## 2015-06-21 DIAGNOSIS — D509 Iron deficiency anemia, unspecified: Secondary | ICD-10-CM | POA: Diagnosis not present

## 2015-06-21 DIAGNOSIS — N186 End stage renal disease: Secondary | ICD-10-CM | POA: Diagnosis not present

## 2015-06-21 DIAGNOSIS — N2581 Secondary hyperparathyroidism of renal origin: Secondary | ICD-10-CM | POA: Diagnosis not present

## 2015-06-25 DIAGNOSIS — N2581 Secondary hyperparathyroidism of renal origin: Secondary | ICD-10-CM | POA: Diagnosis not present

## 2015-06-25 DIAGNOSIS — N186 End stage renal disease: Secondary | ICD-10-CM | POA: Diagnosis not present

## 2015-06-25 DIAGNOSIS — D509 Iron deficiency anemia, unspecified: Secondary | ICD-10-CM | POA: Diagnosis not present

## 2015-06-25 DIAGNOSIS — D631 Anemia in chronic kidney disease: Secondary | ICD-10-CM | POA: Diagnosis not present

## 2015-06-27 DIAGNOSIS — D509 Iron deficiency anemia, unspecified: Secondary | ICD-10-CM | POA: Diagnosis not present

## 2015-06-27 DIAGNOSIS — N186 End stage renal disease: Secondary | ICD-10-CM | POA: Diagnosis not present

## 2015-06-27 DIAGNOSIS — D631 Anemia in chronic kidney disease: Secondary | ICD-10-CM | POA: Diagnosis not present

## 2015-06-27 DIAGNOSIS — N2581 Secondary hyperparathyroidism of renal origin: Secondary | ICD-10-CM | POA: Diagnosis not present

## 2015-06-29 DIAGNOSIS — N186 End stage renal disease: Secondary | ICD-10-CM | POA: Diagnosis not present

## 2015-06-29 DIAGNOSIS — N2581 Secondary hyperparathyroidism of renal origin: Secondary | ICD-10-CM | POA: Diagnosis not present

## 2015-06-29 DIAGNOSIS — D631 Anemia in chronic kidney disease: Secondary | ICD-10-CM | POA: Diagnosis not present

## 2015-06-29 DIAGNOSIS — D509 Iron deficiency anemia, unspecified: Secondary | ICD-10-CM | POA: Diagnosis not present

## 2015-07-02 DIAGNOSIS — N2581 Secondary hyperparathyroidism of renal origin: Secondary | ICD-10-CM | POA: Diagnosis not present

## 2015-07-02 DIAGNOSIS — D509 Iron deficiency anemia, unspecified: Secondary | ICD-10-CM | POA: Diagnosis not present

## 2015-07-02 DIAGNOSIS — N186 End stage renal disease: Secondary | ICD-10-CM | POA: Diagnosis not present

## 2015-07-02 DIAGNOSIS — D631 Anemia in chronic kidney disease: Secondary | ICD-10-CM | POA: Diagnosis not present

## 2015-07-04 DIAGNOSIS — N186 End stage renal disease: Secondary | ICD-10-CM | POA: Diagnosis not present

## 2015-07-04 DIAGNOSIS — D631 Anemia in chronic kidney disease: Secondary | ICD-10-CM | POA: Diagnosis not present

## 2015-07-04 DIAGNOSIS — N2581 Secondary hyperparathyroidism of renal origin: Secondary | ICD-10-CM | POA: Diagnosis not present

## 2015-07-04 DIAGNOSIS — D509 Iron deficiency anemia, unspecified: Secondary | ICD-10-CM | POA: Diagnosis not present

## 2015-07-06 DIAGNOSIS — D509 Iron deficiency anemia, unspecified: Secondary | ICD-10-CM | POA: Diagnosis not present

## 2015-07-06 DIAGNOSIS — D631 Anemia in chronic kidney disease: Secondary | ICD-10-CM | POA: Diagnosis not present

## 2015-07-06 DIAGNOSIS — N2581 Secondary hyperparathyroidism of renal origin: Secondary | ICD-10-CM | POA: Diagnosis not present

## 2015-07-06 DIAGNOSIS — N186 End stage renal disease: Secondary | ICD-10-CM | POA: Diagnosis not present

## 2015-07-09 DIAGNOSIS — D631 Anemia in chronic kidney disease: Secondary | ICD-10-CM | POA: Diagnosis not present

## 2015-07-09 DIAGNOSIS — N2581 Secondary hyperparathyroidism of renal origin: Secondary | ICD-10-CM | POA: Diagnosis not present

## 2015-07-09 DIAGNOSIS — N186 End stage renal disease: Secondary | ICD-10-CM | POA: Diagnosis not present

## 2015-07-09 DIAGNOSIS — D509 Iron deficiency anemia, unspecified: Secondary | ICD-10-CM | POA: Diagnosis not present

## 2015-07-11 DIAGNOSIS — N186 End stage renal disease: Secondary | ICD-10-CM | POA: Diagnosis not present

## 2015-07-11 DIAGNOSIS — D631 Anemia in chronic kidney disease: Secondary | ICD-10-CM | POA: Diagnosis not present

## 2015-07-11 DIAGNOSIS — D509 Iron deficiency anemia, unspecified: Secondary | ICD-10-CM | POA: Diagnosis not present

## 2015-07-11 DIAGNOSIS — N2581 Secondary hyperparathyroidism of renal origin: Secondary | ICD-10-CM | POA: Diagnosis not present

## 2015-07-13 DIAGNOSIS — D509 Iron deficiency anemia, unspecified: Secondary | ICD-10-CM | POA: Diagnosis not present

## 2015-07-13 DIAGNOSIS — N186 End stage renal disease: Secondary | ICD-10-CM | POA: Diagnosis not present

## 2015-07-13 DIAGNOSIS — N2581 Secondary hyperparathyroidism of renal origin: Secondary | ICD-10-CM | POA: Diagnosis not present

## 2015-07-13 DIAGNOSIS — D631 Anemia in chronic kidney disease: Secondary | ICD-10-CM | POA: Diagnosis not present

## 2015-07-14 DIAGNOSIS — Z992 Dependence on renal dialysis: Secondary | ICD-10-CM | POA: Diagnosis not present

## 2015-07-14 DIAGNOSIS — T8612 Kidney transplant failure: Secondary | ICD-10-CM | POA: Diagnosis not present

## 2015-07-14 DIAGNOSIS — N186 End stage renal disease: Secondary | ICD-10-CM | POA: Diagnosis not present

## 2015-07-16 DIAGNOSIS — D509 Iron deficiency anemia, unspecified: Secondary | ICD-10-CM | POA: Diagnosis not present

## 2015-07-16 DIAGNOSIS — D631 Anemia in chronic kidney disease: Secondary | ICD-10-CM | POA: Diagnosis not present

## 2015-07-16 DIAGNOSIS — N186 End stage renal disease: Secondary | ICD-10-CM | POA: Diagnosis not present

## 2015-07-16 DIAGNOSIS — N2581 Secondary hyperparathyroidism of renal origin: Secondary | ICD-10-CM | POA: Diagnosis not present

## 2015-07-18 DIAGNOSIS — N2581 Secondary hyperparathyroidism of renal origin: Secondary | ICD-10-CM | POA: Diagnosis not present

## 2015-07-18 DIAGNOSIS — D509 Iron deficiency anemia, unspecified: Secondary | ICD-10-CM | POA: Diagnosis not present

## 2015-07-18 DIAGNOSIS — D631 Anemia in chronic kidney disease: Secondary | ICD-10-CM | POA: Diagnosis not present

## 2015-07-18 DIAGNOSIS — N186 End stage renal disease: Secondary | ICD-10-CM | POA: Diagnosis not present

## 2015-07-20 DIAGNOSIS — D509 Iron deficiency anemia, unspecified: Secondary | ICD-10-CM | POA: Diagnosis not present

## 2015-07-20 DIAGNOSIS — N2581 Secondary hyperparathyroidism of renal origin: Secondary | ICD-10-CM | POA: Diagnosis not present

## 2015-07-20 DIAGNOSIS — N186 End stage renal disease: Secondary | ICD-10-CM | POA: Diagnosis not present

## 2015-07-20 DIAGNOSIS — D631 Anemia in chronic kidney disease: Secondary | ICD-10-CM | POA: Diagnosis not present

## 2015-07-23 DIAGNOSIS — N186 End stage renal disease: Secondary | ICD-10-CM | POA: Diagnosis not present

## 2015-07-23 DIAGNOSIS — D631 Anemia in chronic kidney disease: Secondary | ICD-10-CM | POA: Diagnosis not present

## 2015-07-23 DIAGNOSIS — N2581 Secondary hyperparathyroidism of renal origin: Secondary | ICD-10-CM | POA: Diagnosis not present

## 2015-07-23 DIAGNOSIS — D509 Iron deficiency anemia, unspecified: Secondary | ICD-10-CM | POA: Diagnosis not present

## 2015-07-25 DIAGNOSIS — N2581 Secondary hyperparathyroidism of renal origin: Secondary | ICD-10-CM | POA: Diagnosis not present

## 2015-07-25 DIAGNOSIS — D509 Iron deficiency anemia, unspecified: Secondary | ICD-10-CM | POA: Diagnosis not present

## 2015-07-25 DIAGNOSIS — N186 End stage renal disease: Secondary | ICD-10-CM | POA: Diagnosis not present

## 2015-07-25 DIAGNOSIS — D631 Anemia in chronic kidney disease: Secondary | ICD-10-CM | POA: Diagnosis not present

## 2015-07-27 DIAGNOSIS — D509 Iron deficiency anemia, unspecified: Secondary | ICD-10-CM | POA: Diagnosis not present

## 2015-07-27 DIAGNOSIS — N186 End stage renal disease: Secondary | ICD-10-CM | POA: Diagnosis not present

## 2015-07-27 DIAGNOSIS — N2581 Secondary hyperparathyroidism of renal origin: Secondary | ICD-10-CM | POA: Diagnosis not present

## 2015-07-27 DIAGNOSIS — D631 Anemia in chronic kidney disease: Secondary | ICD-10-CM | POA: Diagnosis not present

## 2015-07-30 DIAGNOSIS — N186 End stage renal disease: Secondary | ICD-10-CM | POA: Diagnosis not present

## 2015-07-30 DIAGNOSIS — N2581 Secondary hyperparathyroidism of renal origin: Secondary | ICD-10-CM | POA: Diagnosis not present

## 2015-07-30 DIAGNOSIS — D631 Anemia in chronic kidney disease: Secondary | ICD-10-CM | POA: Diagnosis not present

## 2015-07-30 DIAGNOSIS — D509 Iron deficiency anemia, unspecified: Secondary | ICD-10-CM | POA: Diagnosis not present

## 2015-08-01 DIAGNOSIS — D631 Anemia in chronic kidney disease: Secondary | ICD-10-CM | POA: Diagnosis not present

## 2015-08-01 DIAGNOSIS — N2581 Secondary hyperparathyroidism of renal origin: Secondary | ICD-10-CM | POA: Diagnosis not present

## 2015-08-01 DIAGNOSIS — N186 End stage renal disease: Secondary | ICD-10-CM | POA: Diagnosis not present

## 2015-08-01 DIAGNOSIS — D509 Iron deficiency anemia, unspecified: Secondary | ICD-10-CM | POA: Diagnosis not present

## 2015-08-03 DIAGNOSIS — D509 Iron deficiency anemia, unspecified: Secondary | ICD-10-CM | POA: Diagnosis not present

## 2015-08-03 DIAGNOSIS — D631 Anemia in chronic kidney disease: Secondary | ICD-10-CM | POA: Diagnosis not present

## 2015-08-03 DIAGNOSIS — N186 End stage renal disease: Secondary | ICD-10-CM | POA: Diagnosis not present

## 2015-08-03 DIAGNOSIS — N2581 Secondary hyperparathyroidism of renal origin: Secondary | ICD-10-CM | POA: Diagnosis not present

## 2015-08-06 DIAGNOSIS — N2581 Secondary hyperparathyroidism of renal origin: Secondary | ICD-10-CM | POA: Diagnosis not present

## 2015-08-06 DIAGNOSIS — D509 Iron deficiency anemia, unspecified: Secondary | ICD-10-CM | POA: Diagnosis not present

## 2015-08-06 DIAGNOSIS — N186 End stage renal disease: Secondary | ICD-10-CM | POA: Diagnosis not present

## 2015-08-06 DIAGNOSIS — D631 Anemia in chronic kidney disease: Secondary | ICD-10-CM | POA: Diagnosis not present

## 2015-08-08 DIAGNOSIS — D509 Iron deficiency anemia, unspecified: Secondary | ICD-10-CM | POA: Diagnosis not present

## 2015-08-08 DIAGNOSIS — D631 Anemia in chronic kidney disease: Secondary | ICD-10-CM | POA: Diagnosis not present

## 2015-08-08 DIAGNOSIS — N186 End stage renal disease: Secondary | ICD-10-CM | POA: Diagnosis not present

## 2015-08-08 DIAGNOSIS — N2581 Secondary hyperparathyroidism of renal origin: Secondary | ICD-10-CM | POA: Diagnosis not present

## 2015-08-10 DIAGNOSIS — N186 End stage renal disease: Secondary | ICD-10-CM | POA: Diagnosis not present

## 2015-08-10 DIAGNOSIS — D509 Iron deficiency anemia, unspecified: Secondary | ICD-10-CM | POA: Diagnosis not present

## 2015-08-10 DIAGNOSIS — D631 Anemia in chronic kidney disease: Secondary | ICD-10-CM | POA: Diagnosis not present

## 2015-08-10 DIAGNOSIS — N2581 Secondary hyperparathyroidism of renal origin: Secondary | ICD-10-CM | POA: Diagnosis not present

## 2015-08-13 DIAGNOSIS — N186 End stage renal disease: Secondary | ICD-10-CM | POA: Diagnosis not present

## 2015-08-13 DIAGNOSIS — D631 Anemia in chronic kidney disease: Secondary | ICD-10-CM | POA: Diagnosis not present

## 2015-08-13 DIAGNOSIS — N2581 Secondary hyperparathyroidism of renal origin: Secondary | ICD-10-CM | POA: Diagnosis not present

## 2015-08-13 DIAGNOSIS — D509 Iron deficiency anemia, unspecified: Secondary | ICD-10-CM | POA: Diagnosis not present

## 2015-08-14 DIAGNOSIS — Z992 Dependence on renal dialysis: Secondary | ICD-10-CM | POA: Diagnosis not present

## 2015-08-14 DIAGNOSIS — N186 End stage renal disease: Secondary | ICD-10-CM | POA: Diagnosis not present

## 2015-08-14 DIAGNOSIS — T8612 Kidney transplant failure: Secondary | ICD-10-CM | POA: Diagnosis not present

## 2015-08-15 DIAGNOSIS — D631 Anemia in chronic kidney disease: Secondary | ICD-10-CM | POA: Diagnosis not present

## 2015-08-15 DIAGNOSIS — N2581 Secondary hyperparathyroidism of renal origin: Secondary | ICD-10-CM | POA: Diagnosis not present

## 2015-08-15 DIAGNOSIS — N186 End stage renal disease: Secondary | ICD-10-CM | POA: Diagnosis not present

## 2015-08-15 DIAGNOSIS — D509 Iron deficiency anemia, unspecified: Secondary | ICD-10-CM | POA: Diagnosis not present

## 2015-08-17 DIAGNOSIS — D509 Iron deficiency anemia, unspecified: Secondary | ICD-10-CM | POA: Diagnosis not present

## 2015-08-17 DIAGNOSIS — N186 End stage renal disease: Secondary | ICD-10-CM | POA: Diagnosis not present

## 2015-08-17 DIAGNOSIS — N2581 Secondary hyperparathyroidism of renal origin: Secondary | ICD-10-CM | POA: Diagnosis not present

## 2015-08-17 DIAGNOSIS — D631 Anemia in chronic kidney disease: Secondary | ICD-10-CM | POA: Diagnosis not present

## 2015-08-20 DIAGNOSIS — N186 End stage renal disease: Secondary | ICD-10-CM | POA: Diagnosis not present

## 2015-08-20 DIAGNOSIS — N2581 Secondary hyperparathyroidism of renal origin: Secondary | ICD-10-CM | POA: Diagnosis not present

## 2015-08-20 DIAGNOSIS — D631 Anemia in chronic kidney disease: Secondary | ICD-10-CM | POA: Diagnosis not present

## 2015-08-20 DIAGNOSIS — D509 Iron deficiency anemia, unspecified: Secondary | ICD-10-CM | POA: Diagnosis not present

## 2015-08-22 DIAGNOSIS — N186 End stage renal disease: Secondary | ICD-10-CM | POA: Diagnosis not present

## 2015-08-22 DIAGNOSIS — N2581 Secondary hyperparathyroidism of renal origin: Secondary | ICD-10-CM | POA: Diagnosis not present

## 2015-08-22 DIAGNOSIS — D509 Iron deficiency anemia, unspecified: Secondary | ICD-10-CM | POA: Diagnosis not present

## 2015-08-22 DIAGNOSIS — D631 Anemia in chronic kidney disease: Secondary | ICD-10-CM | POA: Diagnosis not present

## 2015-08-24 DIAGNOSIS — D631 Anemia in chronic kidney disease: Secondary | ICD-10-CM | POA: Diagnosis not present

## 2015-08-24 DIAGNOSIS — N186 End stage renal disease: Secondary | ICD-10-CM | POA: Diagnosis not present

## 2015-08-24 DIAGNOSIS — N2581 Secondary hyperparathyroidism of renal origin: Secondary | ICD-10-CM | POA: Diagnosis not present

## 2015-08-24 DIAGNOSIS — D509 Iron deficiency anemia, unspecified: Secondary | ICD-10-CM | POA: Diagnosis not present

## 2015-08-27 DIAGNOSIS — N186 End stage renal disease: Secondary | ICD-10-CM | POA: Diagnosis not present

## 2015-08-27 DIAGNOSIS — D631 Anemia in chronic kidney disease: Secondary | ICD-10-CM | POA: Diagnosis not present

## 2015-08-27 DIAGNOSIS — N2581 Secondary hyperparathyroidism of renal origin: Secondary | ICD-10-CM | POA: Diagnosis not present

## 2015-08-27 DIAGNOSIS — D509 Iron deficiency anemia, unspecified: Secondary | ICD-10-CM | POA: Diagnosis not present

## 2015-08-29 DIAGNOSIS — N2581 Secondary hyperparathyroidism of renal origin: Secondary | ICD-10-CM | POA: Diagnosis not present

## 2015-08-29 DIAGNOSIS — D631 Anemia in chronic kidney disease: Secondary | ICD-10-CM | POA: Diagnosis not present

## 2015-08-29 DIAGNOSIS — D509 Iron deficiency anemia, unspecified: Secondary | ICD-10-CM | POA: Diagnosis not present

## 2015-08-29 DIAGNOSIS — N186 End stage renal disease: Secondary | ICD-10-CM | POA: Diagnosis not present

## 2015-08-31 DIAGNOSIS — N186 End stage renal disease: Secondary | ICD-10-CM | POA: Diagnosis not present

## 2015-08-31 DIAGNOSIS — N2581 Secondary hyperparathyroidism of renal origin: Secondary | ICD-10-CM | POA: Diagnosis not present

## 2015-08-31 DIAGNOSIS — D509 Iron deficiency anemia, unspecified: Secondary | ICD-10-CM | POA: Diagnosis not present

## 2015-08-31 DIAGNOSIS — D631 Anemia in chronic kidney disease: Secondary | ICD-10-CM | POA: Diagnosis not present

## 2015-08-31 IMAGING — DX DG CHEST 2V
2 series · 2 of 2 positions shown · non-contrast
Comparison: Chest radiograph and CTA of the chest performed
07/31/2014

CLINICAL DATA: Acute onset of generalized chest pain. Initial
encounter.

EXAM:
CHEST  2 VIEW

[chest pa]
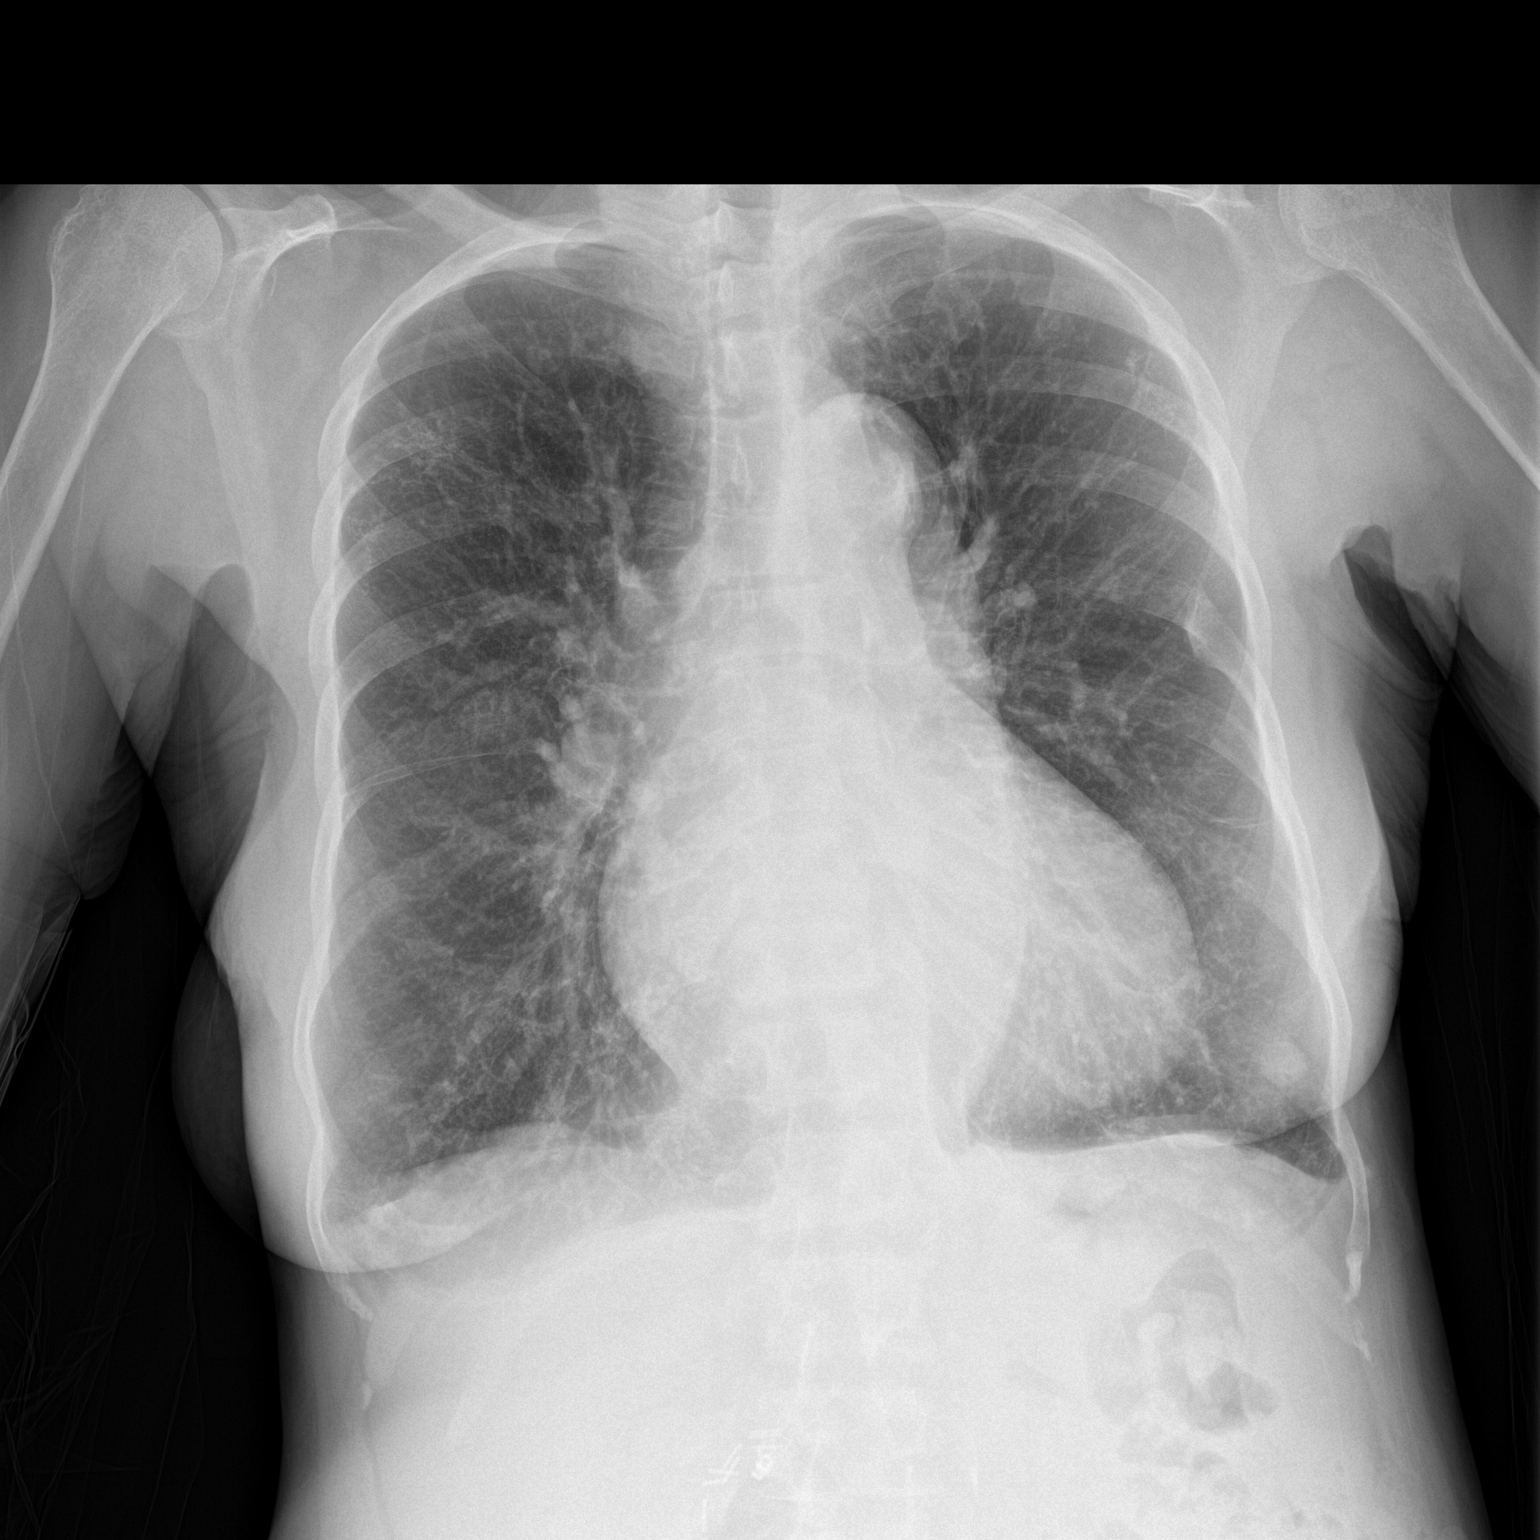

[chest lat]
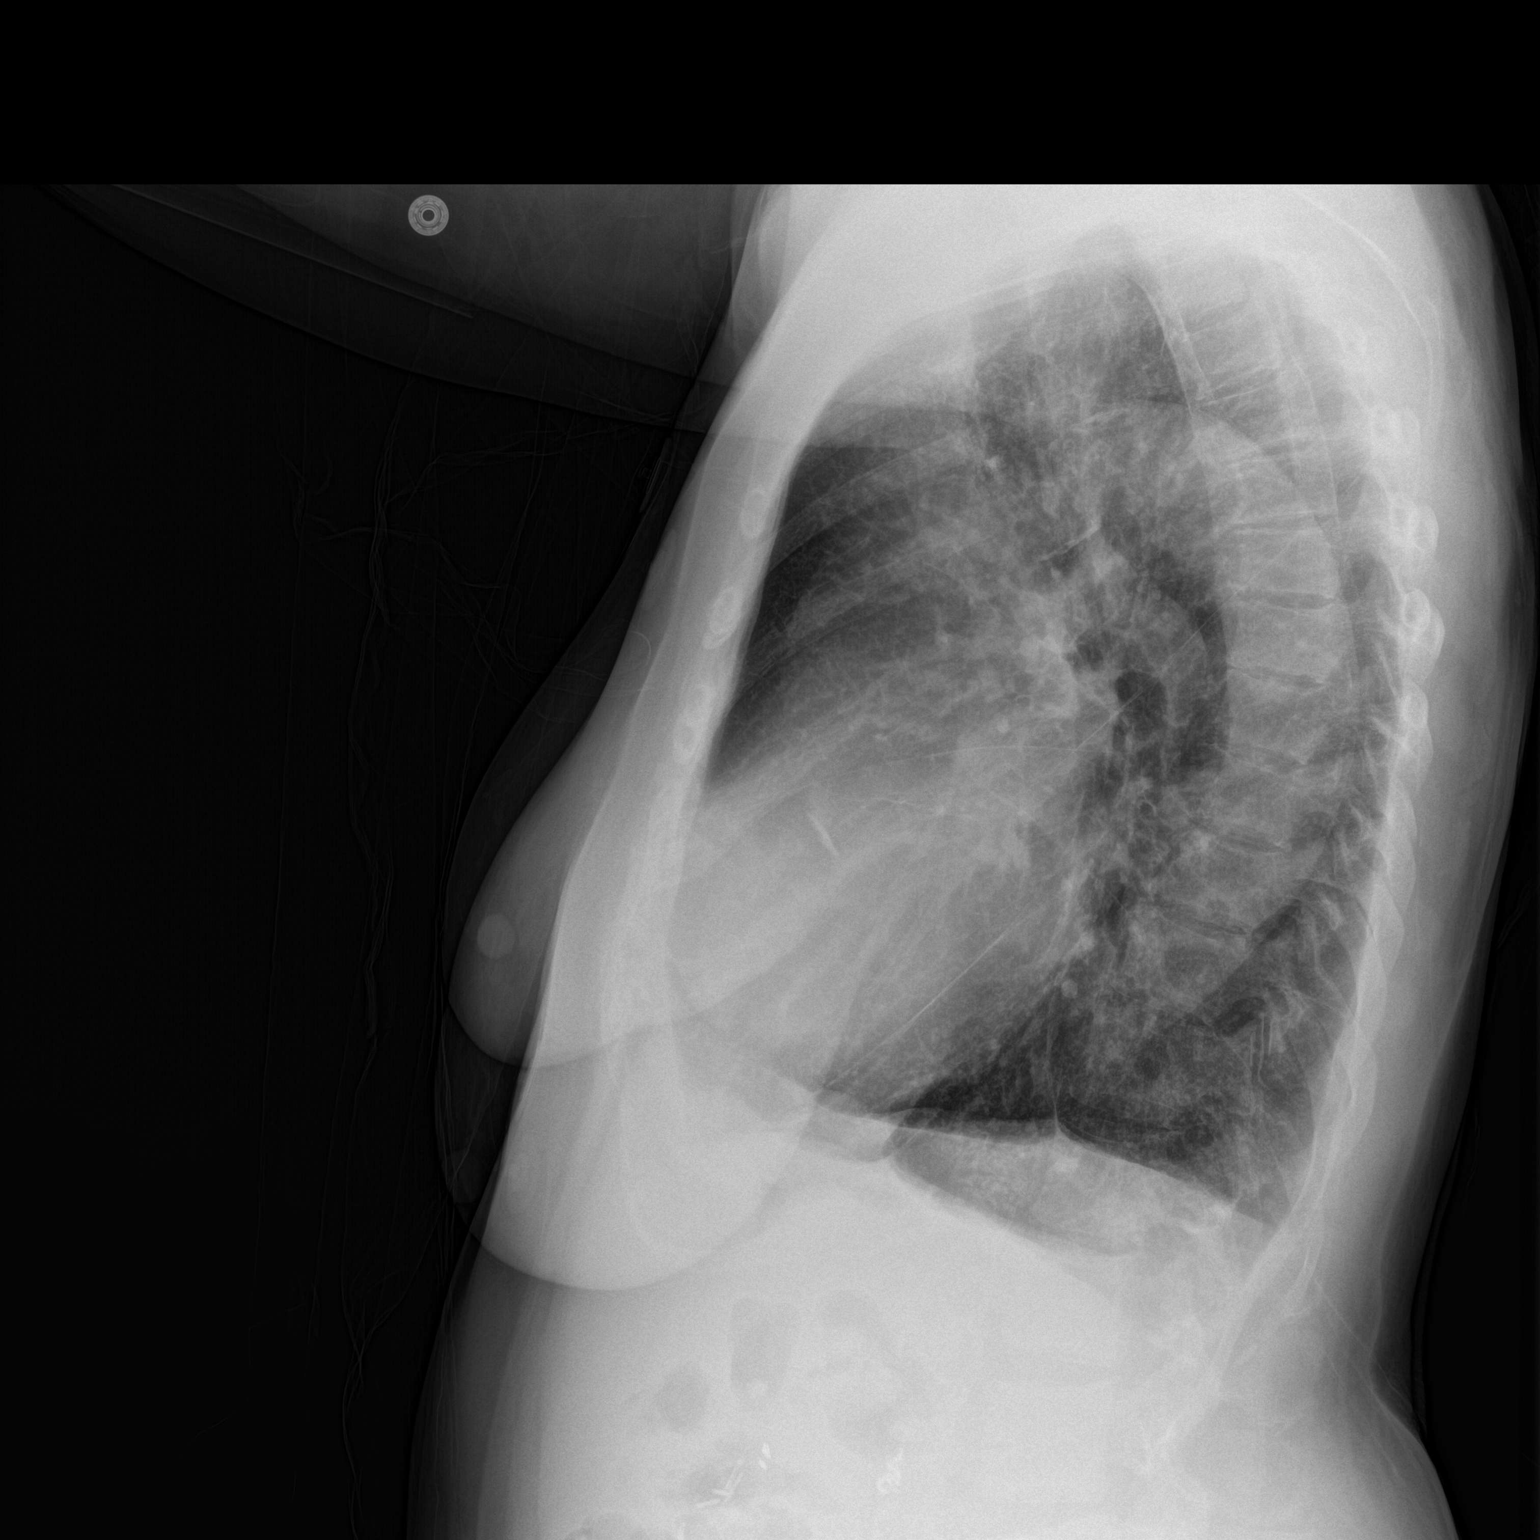

[2 of 2 positions shown; findings below may reference images not displayed]

FINDINGS: The lungs are well-aerated. Vascular congestion is noted. Mildly
increased interstitial markings raise concern for mild interstitial
edema. Mild chronic peripheral interstitial opacities are seen.
There is no evidence of pleural effusion or pneumothorax.

The heart is borderline enlarged. No acute osseous abnormalities are
seen. A left-sided nipple shadow is noted. Scattered clips are seen
about the upper abdomen.
IMPRESSION: Vascular congestion and borderline cardiomegaly. Mildly increased
interstitial markings raise concern for mild interstitial edema.

## 2015-09-03 DIAGNOSIS — N2581 Secondary hyperparathyroidism of renal origin: Secondary | ICD-10-CM | POA: Diagnosis not present

## 2015-09-03 DIAGNOSIS — D631 Anemia in chronic kidney disease: Secondary | ICD-10-CM | POA: Diagnosis not present

## 2015-09-03 DIAGNOSIS — N186 End stage renal disease: Secondary | ICD-10-CM | POA: Diagnosis not present

## 2015-09-03 DIAGNOSIS — D509 Iron deficiency anemia, unspecified: Secondary | ICD-10-CM | POA: Diagnosis not present

## 2015-09-05 DIAGNOSIS — N2581 Secondary hyperparathyroidism of renal origin: Secondary | ICD-10-CM | POA: Diagnosis not present

## 2015-09-05 DIAGNOSIS — N186 End stage renal disease: Secondary | ICD-10-CM | POA: Diagnosis not present

## 2015-09-05 DIAGNOSIS — D631 Anemia in chronic kidney disease: Secondary | ICD-10-CM | POA: Diagnosis not present

## 2015-09-05 DIAGNOSIS — D509 Iron deficiency anemia, unspecified: Secondary | ICD-10-CM | POA: Diagnosis not present

## 2015-09-07 DIAGNOSIS — N2581 Secondary hyperparathyroidism of renal origin: Secondary | ICD-10-CM | POA: Diagnosis not present

## 2015-09-07 DIAGNOSIS — D631 Anemia in chronic kidney disease: Secondary | ICD-10-CM | POA: Diagnosis not present

## 2015-09-07 DIAGNOSIS — N186 End stage renal disease: Secondary | ICD-10-CM | POA: Diagnosis not present

## 2015-09-07 DIAGNOSIS — D509 Iron deficiency anemia, unspecified: Secondary | ICD-10-CM | POA: Diagnosis not present

## 2015-09-10 DIAGNOSIS — D631 Anemia in chronic kidney disease: Secondary | ICD-10-CM | POA: Diagnosis not present

## 2015-09-10 DIAGNOSIS — D509 Iron deficiency anemia, unspecified: Secondary | ICD-10-CM | POA: Diagnosis not present

## 2015-09-10 DIAGNOSIS — N186 End stage renal disease: Secondary | ICD-10-CM | POA: Diagnosis not present

## 2015-09-10 DIAGNOSIS — N2581 Secondary hyperparathyroidism of renal origin: Secondary | ICD-10-CM | POA: Diagnosis not present

## 2015-09-12 DIAGNOSIS — N186 End stage renal disease: Secondary | ICD-10-CM | POA: Diagnosis not present

## 2015-09-12 DIAGNOSIS — D631 Anemia in chronic kidney disease: Secondary | ICD-10-CM | POA: Diagnosis not present

## 2015-09-12 DIAGNOSIS — D509 Iron deficiency anemia, unspecified: Secondary | ICD-10-CM | POA: Diagnosis not present

## 2015-09-12 DIAGNOSIS — N2581 Secondary hyperparathyroidism of renal origin: Secondary | ICD-10-CM | POA: Diagnosis not present

## 2015-09-13 DIAGNOSIS — N186 End stage renal disease: Secondary | ICD-10-CM | POA: Diagnosis not present

## 2015-09-13 DIAGNOSIS — T8612 Kidney transplant failure: Secondary | ICD-10-CM | POA: Diagnosis not present

## 2015-09-13 DIAGNOSIS — Z992 Dependence on renal dialysis: Secondary | ICD-10-CM | POA: Diagnosis not present

## 2015-09-14 DIAGNOSIS — N186 End stage renal disease: Secondary | ICD-10-CM | POA: Diagnosis not present

## 2015-09-14 DIAGNOSIS — D509 Iron deficiency anemia, unspecified: Secondary | ICD-10-CM | POA: Diagnosis not present

## 2015-09-14 DIAGNOSIS — D631 Anemia in chronic kidney disease: Secondary | ICD-10-CM | POA: Diagnosis not present

## 2015-09-14 DIAGNOSIS — N2581 Secondary hyperparathyroidism of renal origin: Secondary | ICD-10-CM | POA: Diagnosis not present

## 2015-09-14 DIAGNOSIS — Z23 Encounter for immunization: Secondary | ICD-10-CM | POA: Diagnosis not present

## 2015-09-17 DIAGNOSIS — D631 Anemia in chronic kidney disease: Secondary | ICD-10-CM | POA: Diagnosis not present

## 2015-09-17 DIAGNOSIS — D509 Iron deficiency anemia, unspecified: Secondary | ICD-10-CM | POA: Diagnosis not present

## 2015-09-17 DIAGNOSIS — Z23 Encounter for immunization: Secondary | ICD-10-CM | POA: Diagnosis not present

## 2015-09-17 DIAGNOSIS — N186 End stage renal disease: Secondary | ICD-10-CM | POA: Diagnosis not present

## 2015-09-17 DIAGNOSIS — N2581 Secondary hyperparathyroidism of renal origin: Secondary | ICD-10-CM | POA: Diagnosis not present

## 2015-09-19 DIAGNOSIS — D509 Iron deficiency anemia, unspecified: Secondary | ICD-10-CM | POA: Diagnosis not present

## 2015-09-19 DIAGNOSIS — N2581 Secondary hyperparathyroidism of renal origin: Secondary | ICD-10-CM | POA: Diagnosis not present

## 2015-09-19 DIAGNOSIS — Z23 Encounter for immunization: Secondary | ICD-10-CM | POA: Diagnosis not present

## 2015-09-19 DIAGNOSIS — N186 End stage renal disease: Secondary | ICD-10-CM | POA: Diagnosis not present

## 2015-09-19 DIAGNOSIS — D631 Anemia in chronic kidney disease: Secondary | ICD-10-CM | POA: Diagnosis not present

## 2015-09-21 DIAGNOSIS — N186 End stage renal disease: Secondary | ICD-10-CM | POA: Diagnosis not present

## 2015-09-21 DIAGNOSIS — D509 Iron deficiency anemia, unspecified: Secondary | ICD-10-CM | POA: Diagnosis not present

## 2015-09-21 DIAGNOSIS — Z23 Encounter for immunization: Secondary | ICD-10-CM | POA: Diagnosis not present

## 2015-09-21 DIAGNOSIS — D631 Anemia in chronic kidney disease: Secondary | ICD-10-CM | POA: Diagnosis not present

## 2015-09-21 DIAGNOSIS — N2581 Secondary hyperparathyroidism of renal origin: Secondary | ICD-10-CM | POA: Diagnosis not present

## 2015-09-24 DIAGNOSIS — N2581 Secondary hyperparathyroidism of renal origin: Secondary | ICD-10-CM | POA: Diagnosis not present

## 2015-09-24 DIAGNOSIS — Z23 Encounter for immunization: Secondary | ICD-10-CM | POA: Diagnosis not present

## 2015-09-24 DIAGNOSIS — N186 End stage renal disease: Secondary | ICD-10-CM | POA: Diagnosis not present

## 2015-09-24 DIAGNOSIS — D509 Iron deficiency anemia, unspecified: Secondary | ICD-10-CM | POA: Diagnosis not present

## 2015-09-24 DIAGNOSIS — D631 Anemia in chronic kidney disease: Secondary | ICD-10-CM | POA: Diagnosis not present

## 2015-09-26 DIAGNOSIS — Z23 Encounter for immunization: Secondary | ICD-10-CM | POA: Diagnosis not present

## 2015-09-26 DIAGNOSIS — N186 End stage renal disease: Secondary | ICD-10-CM | POA: Diagnosis not present

## 2015-09-26 DIAGNOSIS — D509 Iron deficiency anemia, unspecified: Secondary | ICD-10-CM | POA: Diagnosis not present

## 2015-09-26 DIAGNOSIS — N2581 Secondary hyperparathyroidism of renal origin: Secondary | ICD-10-CM | POA: Diagnosis not present

## 2015-09-26 DIAGNOSIS — D631 Anemia in chronic kidney disease: Secondary | ICD-10-CM | POA: Diagnosis not present

## 2015-09-28 DIAGNOSIS — N186 End stage renal disease: Secondary | ICD-10-CM | POA: Diagnosis not present

## 2015-09-28 DIAGNOSIS — Z23 Encounter for immunization: Secondary | ICD-10-CM | POA: Diagnosis not present

## 2015-09-28 DIAGNOSIS — D631 Anemia in chronic kidney disease: Secondary | ICD-10-CM | POA: Diagnosis not present

## 2015-09-28 DIAGNOSIS — N2581 Secondary hyperparathyroidism of renal origin: Secondary | ICD-10-CM | POA: Diagnosis not present

## 2015-09-28 DIAGNOSIS — D509 Iron deficiency anemia, unspecified: Secondary | ICD-10-CM | POA: Diagnosis not present

## 2015-10-01 DIAGNOSIS — N2581 Secondary hyperparathyroidism of renal origin: Secondary | ICD-10-CM | POA: Diagnosis not present

## 2015-10-01 DIAGNOSIS — D509 Iron deficiency anemia, unspecified: Secondary | ICD-10-CM | POA: Diagnosis not present

## 2015-10-01 DIAGNOSIS — Z23 Encounter for immunization: Secondary | ICD-10-CM | POA: Diagnosis not present

## 2015-10-01 DIAGNOSIS — D631 Anemia in chronic kidney disease: Secondary | ICD-10-CM | POA: Diagnosis not present

## 2015-10-01 DIAGNOSIS — N186 End stage renal disease: Secondary | ICD-10-CM | POA: Diagnosis not present

## 2015-10-03 DIAGNOSIS — N2581 Secondary hyperparathyroidism of renal origin: Secondary | ICD-10-CM | POA: Diagnosis not present

## 2015-10-03 DIAGNOSIS — T82858A Stenosis of vascular prosthetic devices, implants and grafts, initial encounter: Secondary | ICD-10-CM | POA: Diagnosis not present

## 2015-10-03 DIAGNOSIS — D509 Iron deficiency anemia, unspecified: Secondary | ICD-10-CM | POA: Diagnosis not present

## 2015-10-03 DIAGNOSIS — N186 End stage renal disease: Secondary | ICD-10-CM | POA: Diagnosis not present

## 2015-10-03 DIAGNOSIS — Z23 Encounter for immunization: Secondary | ICD-10-CM | POA: Diagnosis not present

## 2015-10-03 DIAGNOSIS — Z992 Dependence on renal dialysis: Secondary | ICD-10-CM | POA: Diagnosis not present

## 2015-10-03 DIAGNOSIS — I771 Stricture of artery: Secondary | ICD-10-CM | POA: Diagnosis not present

## 2015-10-03 DIAGNOSIS — D631 Anemia in chronic kidney disease: Secondary | ICD-10-CM | POA: Diagnosis not present

## 2015-10-05 DIAGNOSIS — Z23 Encounter for immunization: Secondary | ICD-10-CM | POA: Diagnosis not present

## 2015-10-05 DIAGNOSIS — N186 End stage renal disease: Secondary | ICD-10-CM | POA: Diagnosis not present

## 2015-10-05 DIAGNOSIS — D631 Anemia in chronic kidney disease: Secondary | ICD-10-CM | POA: Diagnosis not present

## 2015-10-05 DIAGNOSIS — N2581 Secondary hyperparathyroidism of renal origin: Secondary | ICD-10-CM | POA: Diagnosis not present

## 2015-10-05 DIAGNOSIS — D509 Iron deficiency anemia, unspecified: Secondary | ICD-10-CM | POA: Diagnosis not present

## 2015-10-08 DIAGNOSIS — N186 End stage renal disease: Secondary | ICD-10-CM | POA: Diagnosis not present

## 2015-10-08 DIAGNOSIS — D509 Iron deficiency anemia, unspecified: Secondary | ICD-10-CM | POA: Diagnosis not present

## 2015-10-08 DIAGNOSIS — N2581 Secondary hyperparathyroidism of renal origin: Secondary | ICD-10-CM | POA: Diagnosis not present

## 2015-10-08 DIAGNOSIS — Z23 Encounter for immunization: Secondary | ICD-10-CM | POA: Diagnosis not present

## 2015-10-08 DIAGNOSIS — D631 Anemia in chronic kidney disease: Secondary | ICD-10-CM | POA: Diagnosis not present

## 2015-10-09 ENCOUNTER — Other Ambulatory Visit: Payer: Self-pay | Admitting: *Deleted

## 2015-10-09 DIAGNOSIS — Z01810 Encounter for preprocedural cardiovascular examination: Secondary | ICD-10-CM

## 2015-10-09 DIAGNOSIS — N186 End stage renal disease: Secondary | ICD-10-CM

## 2015-10-09 DIAGNOSIS — T82510A Breakdown (mechanical) of surgically created arteriovenous fistula, initial encounter: Secondary | ICD-10-CM

## 2015-10-10 DIAGNOSIS — Z23 Encounter for immunization: Secondary | ICD-10-CM | POA: Diagnosis not present

## 2015-10-10 DIAGNOSIS — N186 End stage renal disease: Secondary | ICD-10-CM | POA: Diagnosis not present

## 2015-10-10 DIAGNOSIS — D509 Iron deficiency anemia, unspecified: Secondary | ICD-10-CM | POA: Diagnosis not present

## 2015-10-10 DIAGNOSIS — D631 Anemia in chronic kidney disease: Secondary | ICD-10-CM | POA: Diagnosis not present

## 2015-10-10 DIAGNOSIS — N2581 Secondary hyperparathyroidism of renal origin: Secondary | ICD-10-CM | POA: Diagnosis not present

## 2015-10-10 IMAGING — CR DG WRIST COMPLETE 3+V*R*
2 series · 2 of 2 positions shown · non-contrast
Comparison: None.

CLINICAL DATA: Recent fall with wrist pain, initial encounter

EXAM:
RIGHT WRIST - COMPLETE 3+ VIEW

[PA]
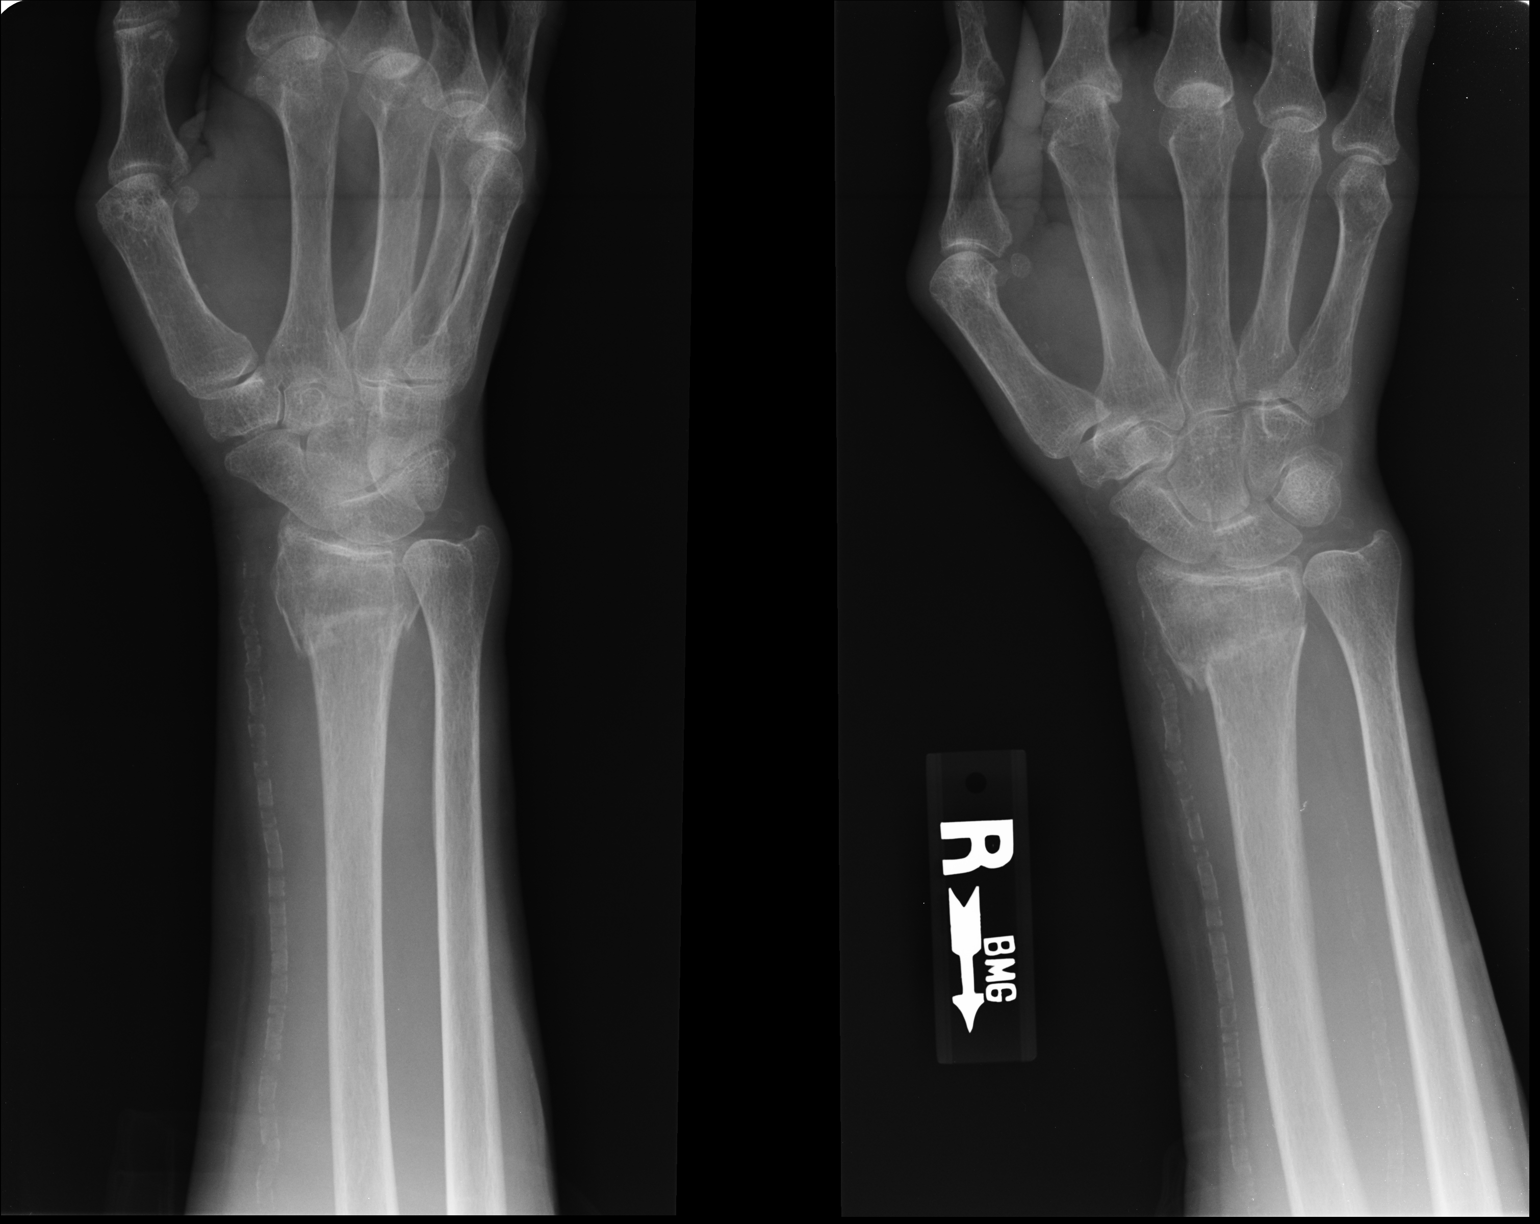

[lateral]
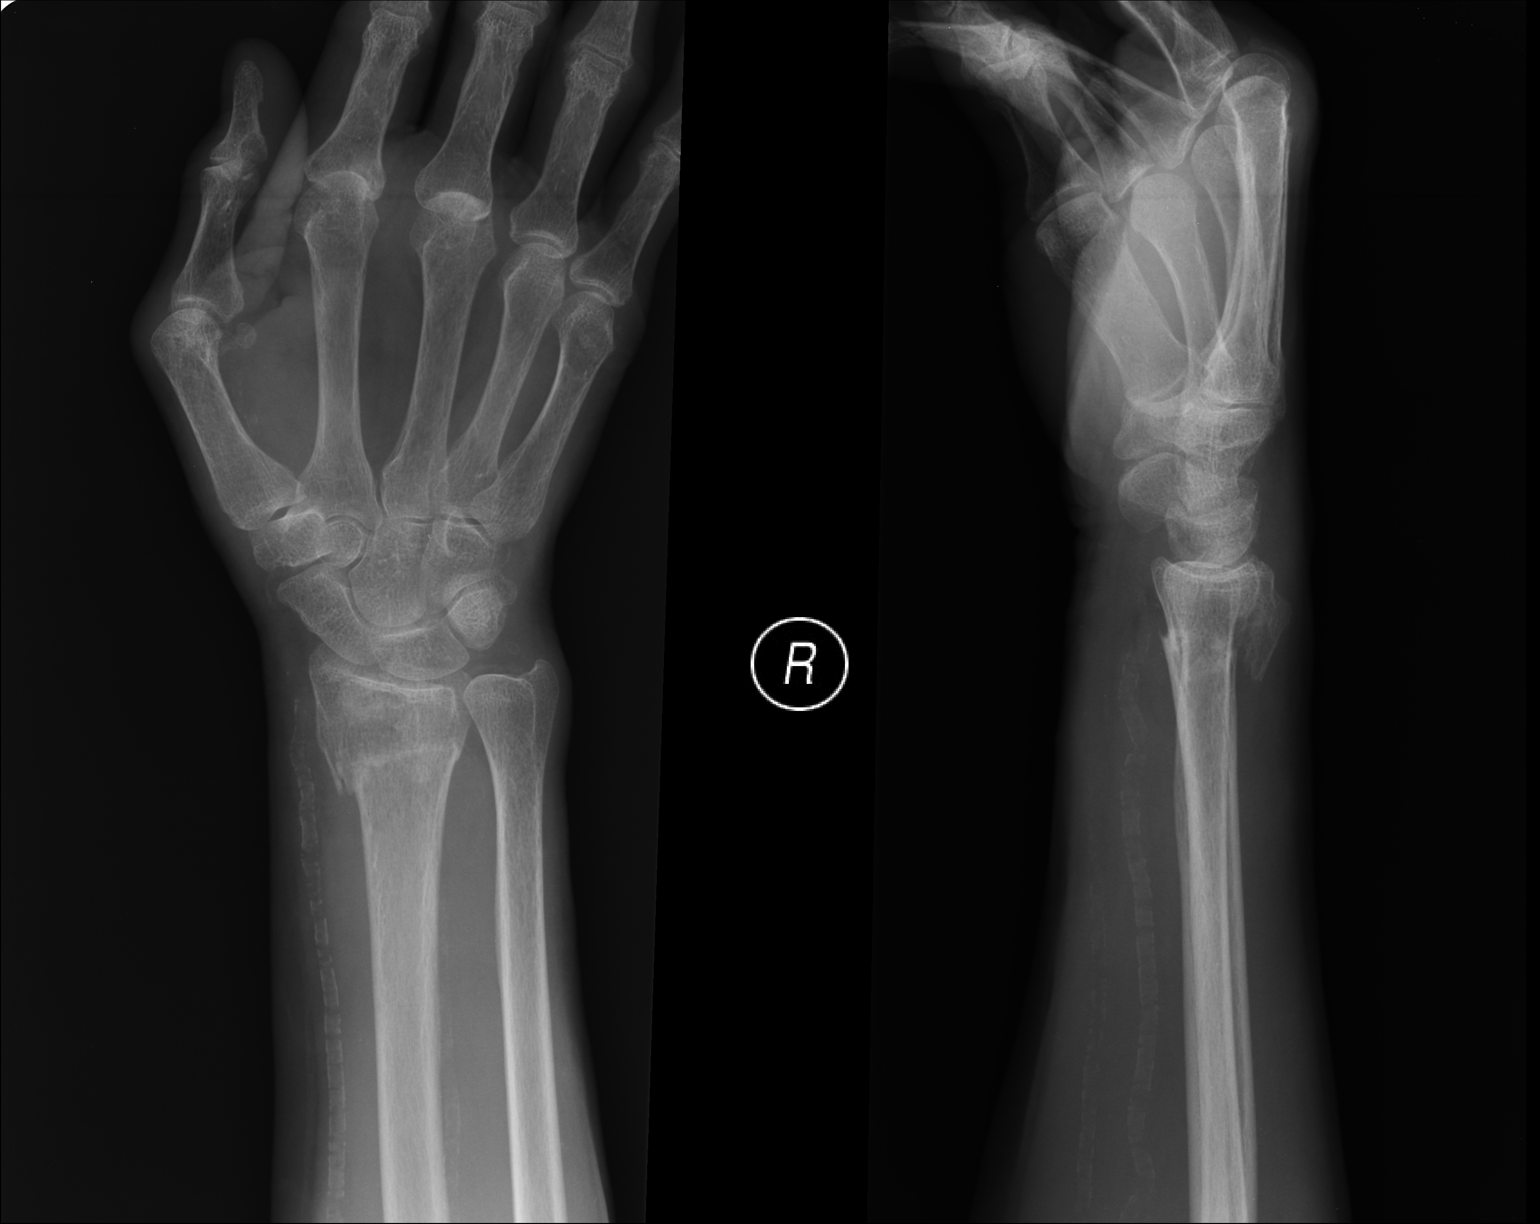

[2 of 2 positions shown; findings below may reference images not displayed]

FINDINGS: Transverse fracture is noted through the distal radial metaphysis
with impaction and posterior displacement of the distal fracture
fragment. This appears to extend into the radiocarpal articulation.
Mild soft tissue changes are seen. Diffuse vascular calcifications
are noted.
IMPRESSION: Distal radial fracture as described.

## 2015-10-10 IMAGING — CR DG HAND COMPLETE 3+V*R*
3 series · 3 of 3 positions shown · non-contrast
Comparison: None.

CLINICAL DATA: Right hand pain following fall, initial encounter

EXAM:
RIGHT HAND - COMPLETE 3+ VIEW

[PA]
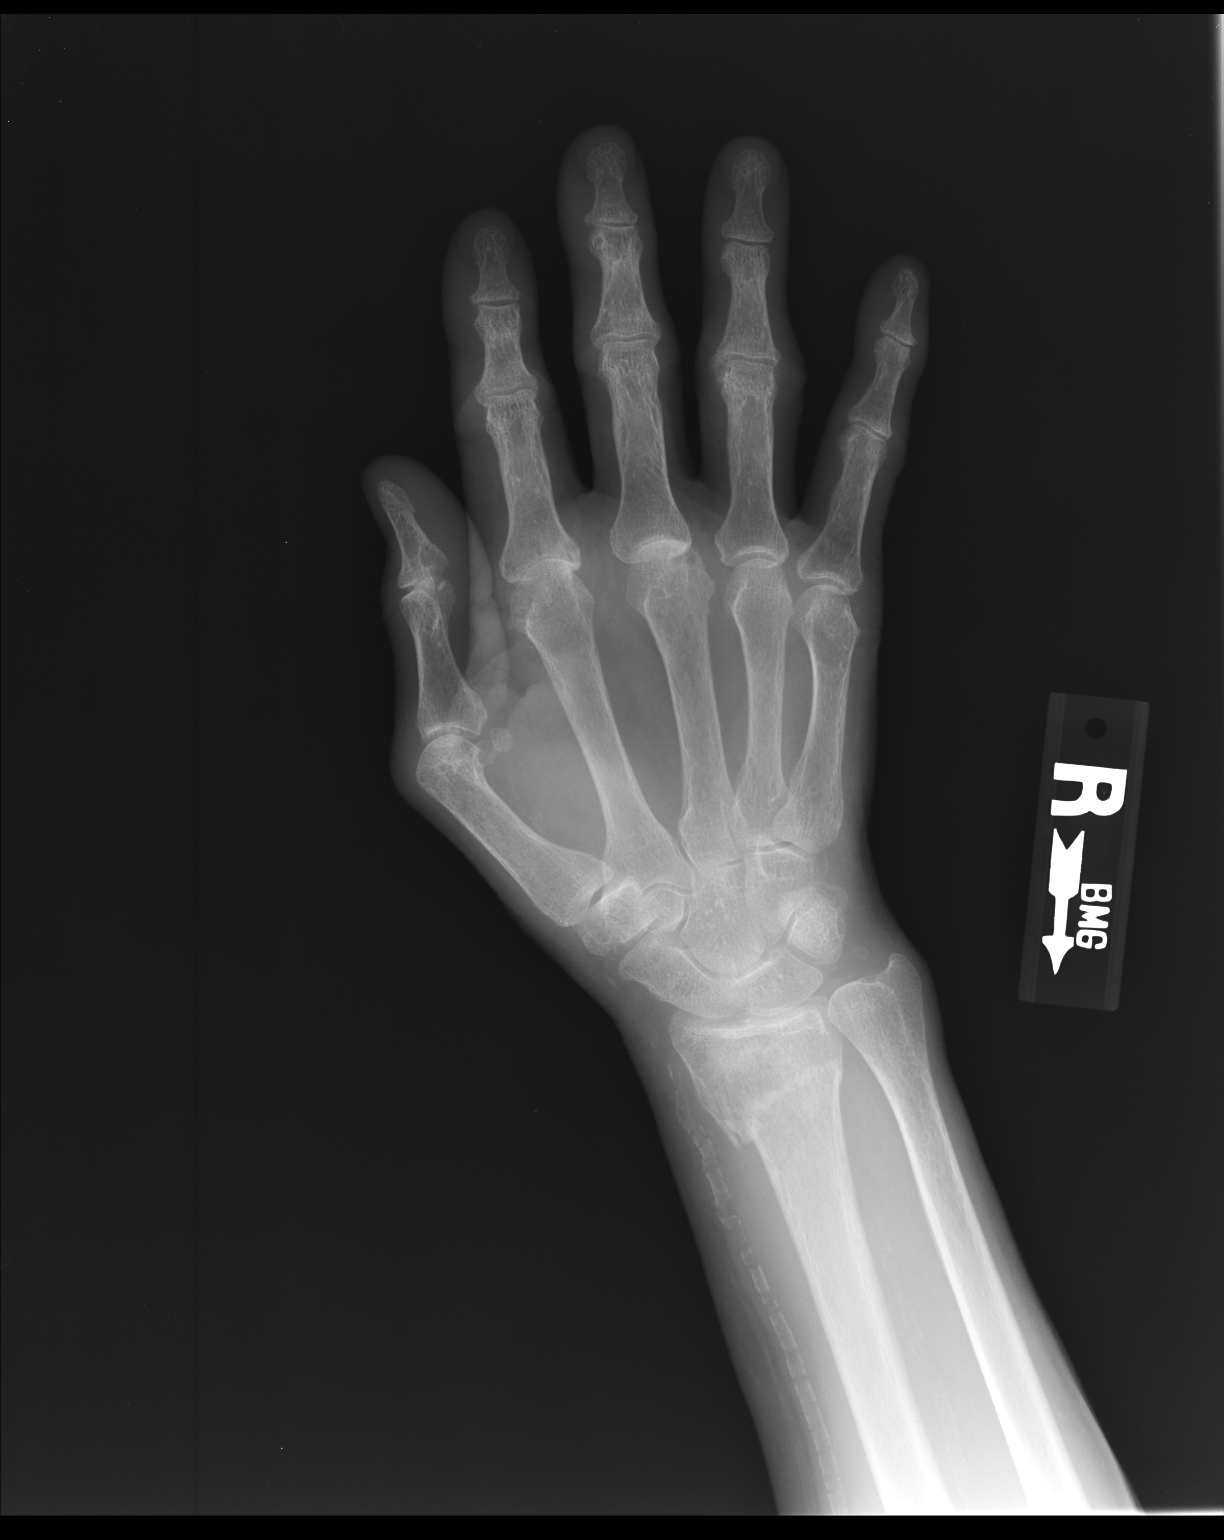

[pa obl]
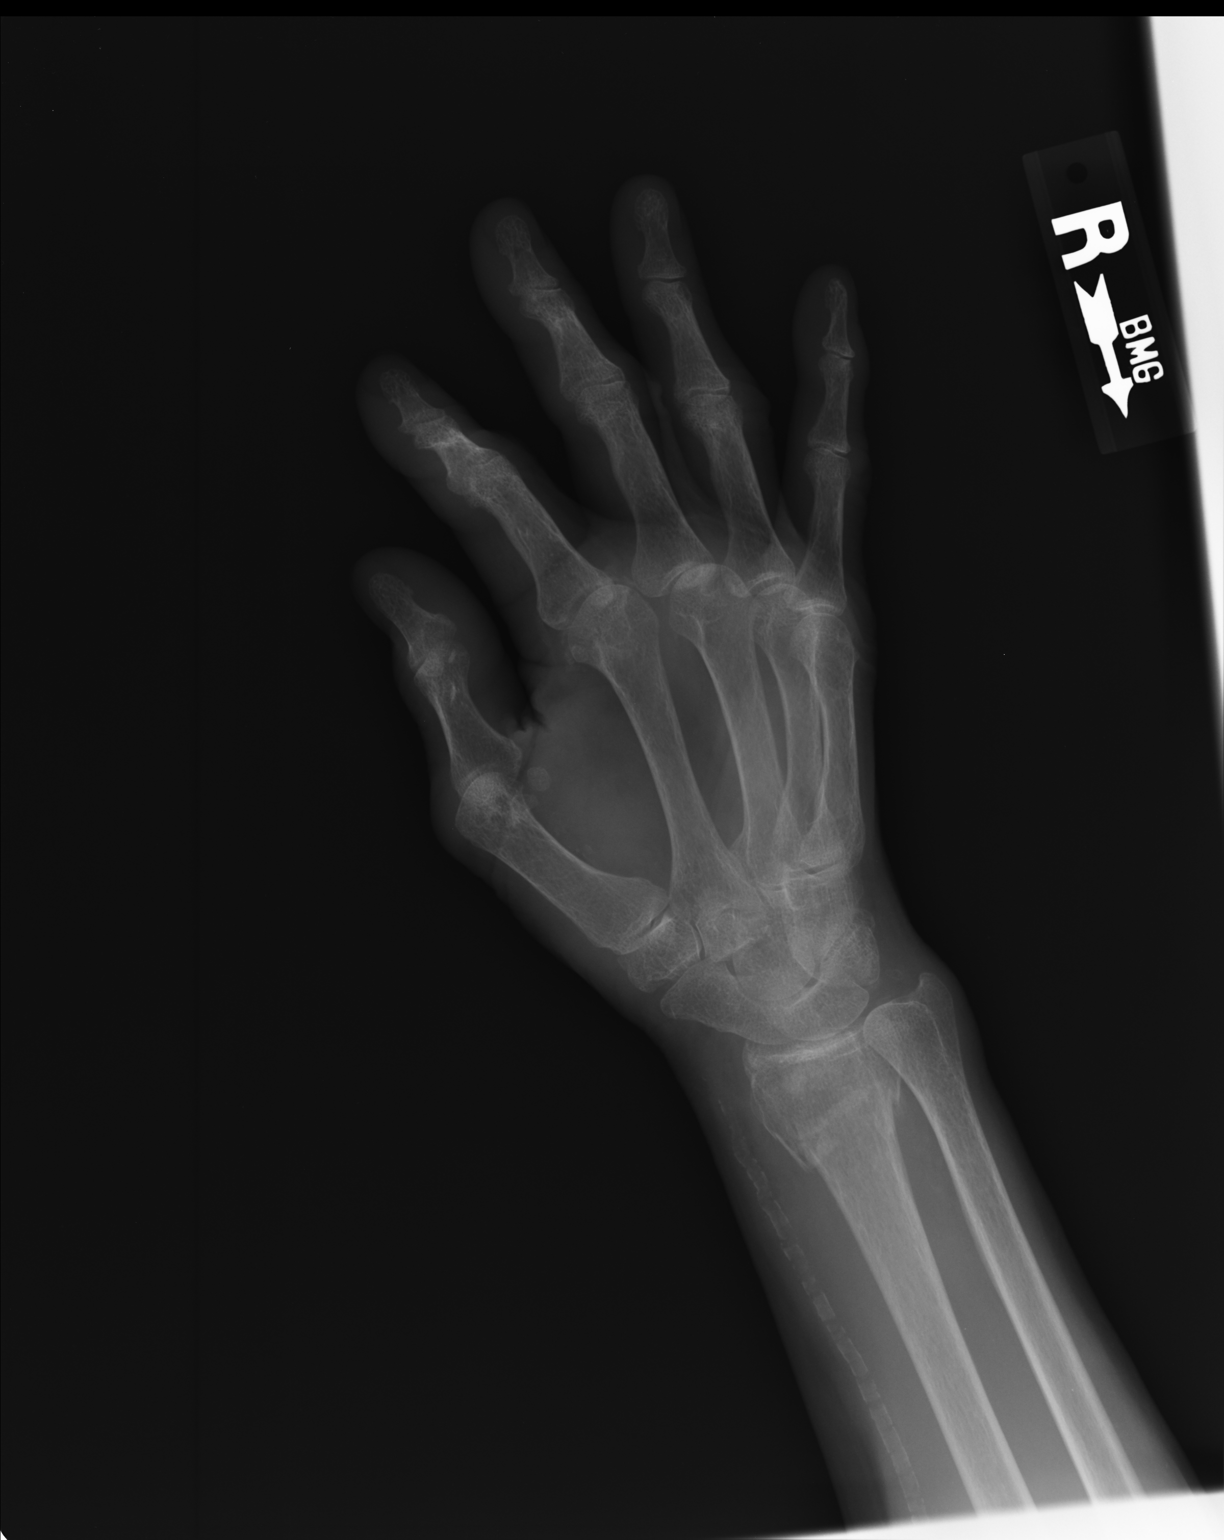

[lateral]
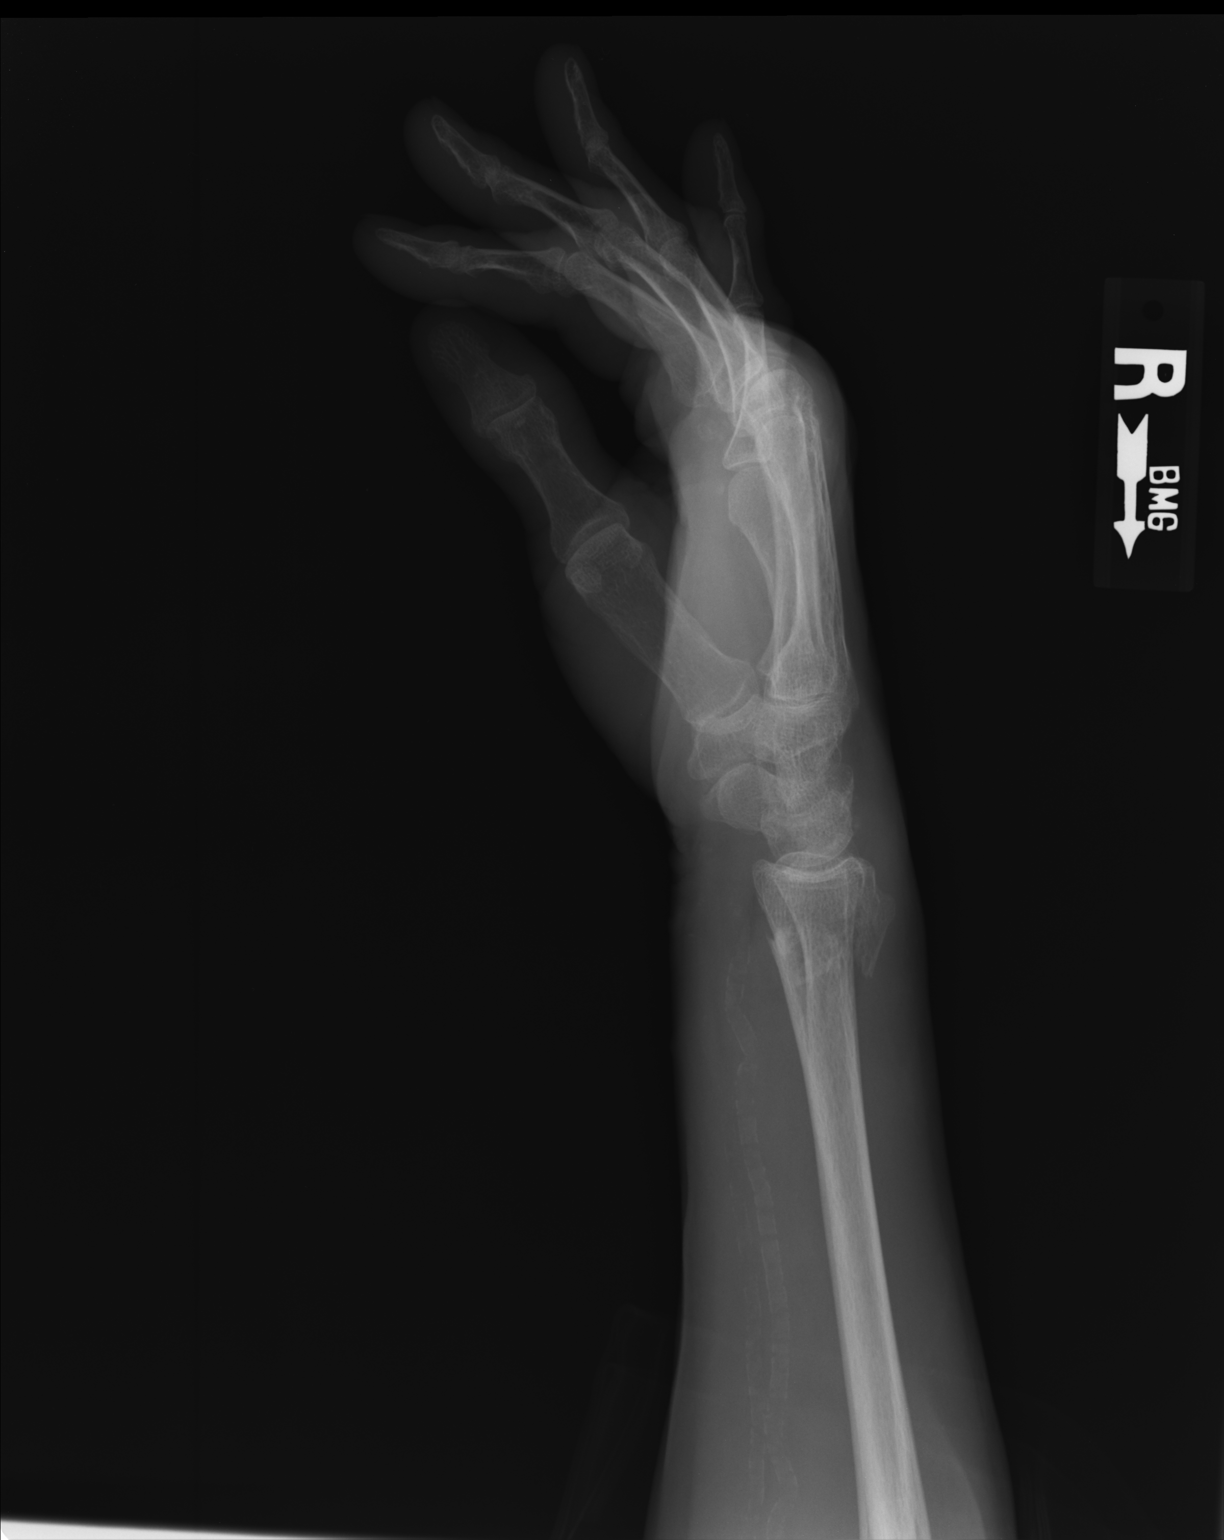

[3 of 3 positions shown; findings below may reference images not displayed]

FINDINGS: Distal right radial fracture is identified with impaction posterior
displacement at the fracture site. No other fractures are seen. Mild
degenerative changes of the interphalangeal joints are seen. Diffuse
vascular calcifications are noted.
IMPRESSION: Distal radial fracture with impaction and posterior displacement.

## 2015-10-12 DIAGNOSIS — N2581 Secondary hyperparathyroidism of renal origin: Secondary | ICD-10-CM | POA: Diagnosis not present

## 2015-10-12 DIAGNOSIS — Z23 Encounter for immunization: Secondary | ICD-10-CM | POA: Diagnosis not present

## 2015-10-12 DIAGNOSIS — N186 End stage renal disease: Secondary | ICD-10-CM | POA: Diagnosis not present

## 2015-10-12 DIAGNOSIS — D631 Anemia in chronic kidney disease: Secondary | ICD-10-CM | POA: Diagnosis not present

## 2015-10-12 DIAGNOSIS — D509 Iron deficiency anemia, unspecified: Secondary | ICD-10-CM | POA: Diagnosis not present

## 2015-10-14 DIAGNOSIS — R9431 Abnormal electrocardiogram [ECG] [EKG]: Secondary | ICD-10-CM | POA: Diagnosis not present

## 2015-10-14 DIAGNOSIS — N186 End stage renal disease: Secondary | ICD-10-CM | POA: Diagnosis not present

## 2015-10-14 DIAGNOSIS — T8612 Kidney transplant failure: Secondary | ICD-10-CM | POA: Diagnosis not present

## 2015-10-14 DIAGNOSIS — Z992 Dependence on renal dialysis: Secondary | ICD-10-CM | POA: Diagnosis not present

## 2015-10-15 DIAGNOSIS — N186 End stage renal disease: Secondary | ICD-10-CM | POA: Diagnosis not present

## 2015-10-15 DIAGNOSIS — D509 Iron deficiency anemia, unspecified: Secondary | ICD-10-CM | POA: Diagnosis not present

## 2015-10-15 DIAGNOSIS — N2581 Secondary hyperparathyroidism of renal origin: Secondary | ICD-10-CM | POA: Diagnosis not present

## 2015-10-15 DIAGNOSIS — D631 Anemia in chronic kidney disease: Secondary | ICD-10-CM | POA: Diagnosis not present

## 2015-10-17 DIAGNOSIS — N2581 Secondary hyperparathyroidism of renal origin: Secondary | ICD-10-CM | POA: Diagnosis not present

## 2015-10-17 DIAGNOSIS — D509 Iron deficiency anemia, unspecified: Secondary | ICD-10-CM | POA: Diagnosis not present

## 2015-10-17 DIAGNOSIS — D631 Anemia in chronic kidney disease: Secondary | ICD-10-CM | POA: Diagnosis not present

## 2015-10-17 DIAGNOSIS — N186 End stage renal disease: Secondary | ICD-10-CM | POA: Diagnosis not present

## 2015-10-19 DIAGNOSIS — N186 End stage renal disease: Secondary | ICD-10-CM | POA: Diagnosis not present

## 2015-10-19 DIAGNOSIS — D631 Anemia in chronic kidney disease: Secondary | ICD-10-CM | POA: Diagnosis not present

## 2015-10-19 DIAGNOSIS — N2581 Secondary hyperparathyroidism of renal origin: Secondary | ICD-10-CM | POA: Diagnosis not present

## 2015-10-19 DIAGNOSIS — D509 Iron deficiency anemia, unspecified: Secondary | ICD-10-CM | POA: Diagnosis not present

## 2015-10-22 DIAGNOSIS — D509 Iron deficiency anemia, unspecified: Secondary | ICD-10-CM | POA: Diagnosis not present

## 2015-10-22 DIAGNOSIS — N2581 Secondary hyperparathyroidism of renal origin: Secondary | ICD-10-CM | POA: Diagnosis not present

## 2015-10-22 DIAGNOSIS — N186 End stage renal disease: Secondary | ICD-10-CM | POA: Diagnosis not present

## 2015-10-22 DIAGNOSIS — D631 Anemia in chronic kidney disease: Secondary | ICD-10-CM | POA: Diagnosis not present

## 2015-10-24 ENCOUNTER — Encounter: Payer: Self-pay | Admitting: Surgery

## 2015-10-24 DIAGNOSIS — D509 Iron deficiency anemia, unspecified: Secondary | ICD-10-CM | POA: Diagnosis not present

## 2015-10-24 DIAGNOSIS — N186 End stage renal disease: Secondary | ICD-10-CM | POA: Diagnosis not present

## 2015-10-24 DIAGNOSIS — D631 Anemia in chronic kidney disease: Secondary | ICD-10-CM | POA: Diagnosis not present

## 2015-10-24 DIAGNOSIS — N2581 Secondary hyperparathyroidism of renal origin: Secondary | ICD-10-CM | POA: Diagnosis not present

## 2015-10-26 DIAGNOSIS — N186 End stage renal disease: Secondary | ICD-10-CM | POA: Diagnosis not present

## 2015-10-26 DIAGNOSIS — N2581 Secondary hyperparathyroidism of renal origin: Secondary | ICD-10-CM | POA: Diagnosis not present

## 2015-10-26 DIAGNOSIS — D631 Anemia in chronic kidney disease: Secondary | ICD-10-CM | POA: Diagnosis not present

## 2015-10-26 DIAGNOSIS — D509 Iron deficiency anemia, unspecified: Secondary | ICD-10-CM | POA: Diagnosis not present

## 2015-10-28 ENCOUNTER — Ambulatory Visit (INDEPENDENT_AMBULATORY_CARE_PROVIDER_SITE_OTHER): Payer: Medicare Other | Admitting: Surgery

## 2015-10-28 ENCOUNTER — Ambulatory Visit (HOSPITAL_COMMUNITY)
Admission: RE | Admit: 2015-10-28 | Discharge: 2015-10-28 | Disposition: A | Discharge status: Home / Self Care | Payer: Medicare Other | Source: Ambulatory Visit | Attending: Surgery | Admitting: Surgery

## 2015-10-28 ENCOUNTER — Encounter: Payer: Self-pay | Admitting: Surgery

## 2015-10-28 ENCOUNTER — Ambulatory Visit (INDEPENDENT_AMBULATORY_CARE_PROVIDER_SITE_OTHER)
Admission: RE | Admit: 2015-10-28 | Discharge: 2015-10-28 | Disposition: A | Discharge status: Home / Self Care | Payer: Medicare Other | Source: Ambulatory Visit | Attending: Surgery | Admitting: Surgery

## 2015-10-28 VITALS — BP 206/124 | HR 65 | Temp 97.8°F | Resp 16 | Ht 62.0 in | Wt 132.0 lb

## 2015-10-28 DIAGNOSIS — Y832 Surgical operation with anastomosis, bypass or graft as the cause of abnormal reaction of the patient, or of later complication, without mention of misadventure at the time of the procedure: Secondary | ICD-10-CM | POA: Insufficient documentation

## 2015-10-28 DIAGNOSIS — T82510A Breakdown (mechanical) of surgically created arteriovenous fistula, initial encounter: Secondary | ICD-10-CM | POA: Diagnosis not present

## 2015-10-28 DIAGNOSIS — Z992 Dependence on renal dialysis: Secondary | ICD-10-CM | POA: Diagnosis not present

## 2015-10-28 DIAGNOSIS — Z01810 Encounter for preprocedural cardiovascular examination: Secondary | ICD-10-CM

## 2015-10-28 DIAGNOSIS — N186 End stage renal disease: Secondary | ICD-10-CM | POA: Diagnosis not present

## 2015-10-28 NOTE — Progress Notes (Signed)
Patient name: [name] MRN: [id number] DOB: 01-11-1951 Sex: female   Referred by: Dr. Joelyn Oms  Reason for referral:  Chief Complaint  Patient presents with  . New Evaluation    Ref. by Dr.Sanford  C/O  Left wrist clot/ needs new access.   Left wrist pain level 7-8 and swollen for 3 week. Pt is taking Doxycycline  100mg  bid    HISTORY OF PRESENT ILLNESS: This is a 64 year old female who comes in today for new dialysis access.  She is on hemodialysis on Tuesday Thursday Saturday via a right-sided catheter.  She is right-handed.  The patient's initial left radiocephalic fistula was placed around 1991.  She underwent kidney transplantation in 1996.  She was required to restart dialysis in 2012 when her renal transplant failed.  At that time she was able to continue using her left radiocephalic fistula.  Recently, this went on to occlude and she is here for new access.  She has a right-sided catheter.  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  She is a nonsmoker.  She suffers from hypercholesterolemia and takes a statin.  Past Medical History  Diagnosis Date  . Anxiety   . Blood transfusion   . Depression   . GERD (gastroesophageal reflux disease)   . Hyperlipidemia   . Hypertension   . Neuromuscular disorder (HCC)     neuropathy hand and legs  . Weight loss, unintentional   . Adenomatous polyp of colon 10/2010  . Diverticula, colon   . CHF (congestive heart failure) ([place])   . 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  Had deceased donor kidney transplant in 1996.  Had some early rejection then stable function for years, then had slow decline of function and went back on hemodialysis in 2012.  Gets HD TTS schedule at [facility] still using L forearm AVF.     Marland Kitchen Anemia in CKD (chronic kidney disease) 11/07/2012  . Arthritis   .  Osteoporosis     Past Surgical History  Procedure Laterality Date  . Kidney transplant  1996  . Back surgery    . Cervical fusion    . Av fistula placement      for dialysis  . Cholecystectomy  12/02/2012    Procedure: LAPAROSCOPIC CHOLECYSTECTOMY WITH INTRAOPERATIVE CHOLANGIOGRAM;  Surgeon: Edward Jolly, MD;  Location: [facility] OR;  Service: General;  Laterality: N/A;    Social History   Social History  . Marital Status: Widowed    Spouse Name: N/A  . Number of Children: 2  . Years of Education: N/A   Occupational History  . Not on file.   Social History Main Topics  . Smoking status: Former Smoker    Types: Cigarettes    Quit date: 12/31/1991  . Smokeless tobacco: Never Used  . Alcohol Use: No  . Drug Use: No  . Sexual Activity: No   Other Topics Concern  . Not on file   Social History Narrative    Family History  Problem Relation Age of Onset  . Colon cancer Brother   . Cancer Brother   . Coronary artery disease Mother 63  . Hyperlipidemia Mother   . Hypertension Mother   . Esophageal cancer Neg Hx   . Stomach cancer Neg Hx   . Rectal cancer Neg Hx     Allergies as of  10/28/2015 - Review Complete 10/28/2015  Allergen Reaction Noted  . Sulfa antibiotics Other (See Comments) 11/06/2012    Current Outpatient Prescriptions on File Prior to Visit  Medication Sig Dispense Refill  . allopurinol (ZYLOPRIM) 300 MG tablet Take 150 mg by mouth at bedtime.    . ALPRAZolam (XANAX) 0.25 MG tablet Take 0.25 mg by mouth daily as needed for anxiety.    Marland Kitchen aspirin 325 MG tablet Take 1 tablet (325 mg total) by mouth daily. 30 tablet 0  . b complex-vitamin c-folic acid (NEPHRO-VITE) 0.8 MG TABS tablet Take 1 tablet by mouth at bedtime.    . calcium carbonate (TUMS - DOSED IN MG ELEMENTAL CALCIUM) 500 MG chewable tablet Chew 1 tablet by mouth 3 (three) times daily as needed. For acid    . cinacalcet (SENSIPAR) 30 MG tablet Take 30 mg by mouth daily.    Marland Kitchen doxazosin  (CARDURA) 8 MG tablet Take 1 tablet (8 mg total) by mouth 2 (two) times daily. (Patient taking differently: Take 8 mg by mouth 2 (two) times daily as needed (hypertension). ) 60 tablet 1  . HYDROcodone-acetaminophen (NORCO) 5-325 MG per tablet Take 1 tablet by mouth every 4 (four) hours as needed for moderate pain. (Patient taking differently: Take 1 tablet by mouth as needed for moderate pain. ) 20 tablet 0  . labetalol (NORMODYNE) 300 MG tablet Take 300 mg by mouth 2 (two) times daily.    Marland Kitchen losartan (COZAAR) 100 MG tablet Take 100 mg by mouth at bedtime.     . promethazine (PHENERGAN) 25 MG tablet Take 25 mg by mouth every 6 (six) hours as needed for nausea or vomiting.    . simvastatin (ZOCOR) 10 MG tablet Take 10 mg by mouth at bedtime.     . sorbitol 70 % solution Take 60 mLs by mouth daily as needed (constipation).     Marland Kitchen zolpidem (AMBIEN) 10 MG tablet Take 10 mg by mouth at bedtime.     No current facility-administered medications on file prior to visit.     REVIEW OF SYSTEMS: Cardiovascular: No chest pain, chest pressure, palpitations, orthopnea, or dyspnea on exertion. No claudication or rest pain,  No history of DVT or phlebitis. Pulmonary: No productive cough, asthma or wheezing. Neurologic: No weakness, paresthesias, aphasia, or amaurosis. No dizziness. Hematologic: No bleeding problems or clotting disorders. Musculoskeletal: No joint pain or joint swelling. Gastrointestinal: No blood in stool or hematemesis Genitourinary: No dysuria or hematuria. Psychiatric:: No history of major depression. Integumentary: No rashes or ulcers. Constitutional: No fever or chills.  PHYSICAL EXAMINATION:  Filed Vitals:   10/28/15 1502 10/28/15 1515  BP: 211/120 206/124  Pulse: 62 65  Temp: 97.8 F (36.6 C)   TempSrc: Oral   Resp: 16   Height: 5\' 2"  (1.575 m)   Weight: 132 lb (59.875 kg)   SpO2: 100%    Body mass index is 24.14 kg/(m^2). General: The patient appears their stated age.     HEENT:  No gross abnormalities Pulmonary: Respirations are non-labored Musculoskeletal: There are no major deformities.   Neurologic: No focal weakness or paresthesias are detected, Skin: There are no ulcer or rashes noted. Psychiatric: The patient has normal affect. Cardiovascular: Occluded left radiocephalic aneurysmal fistula Palpable brachial pulse Diagnostic Studies: I have reviewed a old fistulogram from interventional radiology which shows no significant central venous stenosis and a relatively straight upper arm cephalic vein.  The patient had venous evaluation today which shows an excellent caliber cephalic vein in  the left measuring for the most part greater than 0.6 cm.  She has triphasic arterial studies.  *  Assessment:  End-stage renal disease Plan: I think the patient has an excellent cephalic vein in the upper arm to proceed with a left brachiocephalic fistula.  The risks and benefits of the procedure were discussed with the patient.  All of her questions were answered.  This will be scheduled in the immediate future.     Eldridge Abrahams, M.D. [facility] Office: [phone] Pager:  [phone]

## 2015-10-28 NOTE — Progress Notes (Signed)
Filed Vitals:   10/28/15 1502 10/28/15 1515  BP: 211/120 206/124  Pulse: 62 65  Temp: 97.8 F (36.6 C)   TempSrc: Oral   Resp: 16   Height: 5\' 2"  (1.575 m)   Weight: 132 lb (59.875 kg)   SpO2: 100%

## 2015-10-29 DIAGNOSIS — D509 Iron deficiency anemia, unspecified: Secondary | ICD-10-CM | POA: Diagnosis not present

## 2015-10-29 DIAGNOSIS — N2581 Secondary hyperparathyroidism of renal origin: Secondary | ICD-10-CM | POA: Diagnosis not present

## 2015-10-29 DIAGNOSIS — D631 Anemia in chronic kidney disease: Secondary | ICD-10-CM | POA: Diagnosis not present

## 2015-10-29 DIAGNOSIS — N186 End stage renal disease: Secondary | ICD-10-CM | POA: Diagnosis not present

## 2015-10-31 DIAGNOSIS — D509 Iron deficiency anemia, unspecified: Secondary | ICD-10-CM | POA: Diagnosis not present

## 2015-10-31 DIAGNOSIS — N2581 Secondary hyperparathyroidism of renal origin: Secondary | ICD-10-CM | POA: Diagnosis not present

## 2015-10-31 DIAGNOSIS — N186 End stage renal disease: Secondary | ICD-10-CM | POA: Diagnosis not present

## 2015-10-31 DIAGNOSIS — D631 Anemia in chronic kidney disease: Secondary | ICD-10-CM | POA: Diagnosis not present

## 2015-11-02 DIAGNOSIS — N186 End stage renal disease: Secondary | ICD-10-CM | POA: Diagnosis not present

## 2015-11-02 DIAGNOSIS — D509 Iron deficiency anemia, unspecified: Secondary | ICD-10-CM | POA: Diagnosis not present

## 2015-11-02 DIAGNOSIS — D631 Anemia in chronic kidney disease: Secondary | ICD-10-CM | POA: Diagnosis not present

## 2015-11-02 DIAGNOSIS — N2581 Secondary hyperparathyroidism of renal origin: Secondary | ICD-10-CM | POA: Diagnosis not present

## 2015-11-05 ENCOUNTER — Other Ambulatory Visit: Payer: Self-pay

## 2015-11-05 DIAGNOSIS — D631 Anemia in chronic kidney disease: Secondary | ICD-10-CM | POA: Diagnosis not present

## 2015-11-05 DIAGNOSIS — D509 Iron deficiency anemia, unspecified: Secondary | ICD-10-CM | POA: Diagnosis not present

## 2015-11-05 DIAGNOSIS — N186 End stage renal disease: Secondary | ICD-10-CM | POA: Diagnosis not present

## 2015-11-05 DIAGNOSIS — N2581 Secondary hyperparathyroidism of renal origin: Secondary | ICD-10-CM | POA: Diagnosis not present

## 2015-11-08 DIAGNOSIS — N2581 Secondary hyperparathyroidism of renal origin: Secondary | ICD-10-CM | POA: Diagnosis not present

## 2015-11-08 DIAGNOSIS — D509 Iron deficiency anemia, unspecified: Secondary | ICD-10-CM | POA: Diagnosis not present

## 2015-11-08 DIAGNOSIS — D631 Anemia in chronic kidney disease: Secondary | ICD-10-CM | POA: Diagnosis not present

## 2015-11-08 DIAGNOSIS — N186 End stage renal disease: Secondary | ICD-10-CM | POA: Diagnosis not present

## 2015-11-10 DIAGNOSIS — D631 Anemia in chronic kidney disease: Secondary | ICD-10-CM | POA: Diagnosis not present

## 2015-11-10 DIAGNOSIS — N2581 Secondary hyperparathyroidism of renal origin: Secondary | ICD-10-CM | POA: Diagnosis not present

## 2015-11-10 DIAGNOSIS — D509 Iron deficiency anemia, unspecified: Secondary | ICD-10-CM | POA: Diagnosis not present

## 2015-11-10 DIAGNOSIS — N186 End stage renal disease: Secondary | ICD-10-CM | POA: Diagnosis not present

## 2015-11-12 DIAGNOSIS — N186 End stage renal disease: Secondary | ICD-10-CM | POA: Diagnosis not present

## 2015-11-12 DIAGNOSIS — D631 Anemia in chronic kidney disease: Secondary | ICD-10-CM | POA: Diagnosis not present

## 2015-11-12 DIAGNOSIS — D509 Iron deficiency anemia, unspecified: Secondary | ICD-10-CM | POA: Diagnosis not present

## 2015-11-12 DIAGNOSIS — N2581 Secondary hyperparathyroidism of renal origin: Secondary | ICD-10-CM | POA: Diagnosis not present

## 2015-11-13 DIAGNOSIS — N186 End stage renal disease: Secondary | ICD-10-CM | POA: Diagnosis not present

## 2015-11-13 DIAGNOSIS — T8612 Kidney transplant failure: Secondary | ICD-10-CM | POA: Diagnosis not present

## 2015-11-13 DIAGNOSIS — Z992 Dependence on renal dialysis: Secondary | ICD-10-CM | POA: Diagnosis not present

## 2015-11-14 DIAGNOSIS — N186 End stage renal disease: Secondary | ICD-10-CM | POA: Diagnosis not present

## 2015-11-14 DIAGNOSIS — I639 Cerebral infarction, unspecified: Secondary | ICD-10-CM

## 2015-11-14 DIAGNOSIS — D509 Iron deficiency anemia, unspecified: Secondary | ICD-10-CM | POA: Diagnosis not present

## 2015-11-14 DIAGNOSIS — D631 Anemia in chronic kidney disease: Secondary | ICD-10-CM | POA: Diagnosis not present

## 2015-11-14 DIAGNOSIS — N2581 Secondary hyperparathyroidism of renal origin: Secondary | ICD-10-CM | POA: Diagnosis not present

## 2015-11-14 HISTORY — DX: Cerebral infarction, unspecified: I63.9

## 2015-11-16 ENCOUNTER — Inpatient Hospital Stay (HOSPITAL_COMMUNITY): Payer: Medicare Other

## 2015-11-16 ENCOUNTER — Emergency Department (HOSPITAL_COMMUNITY): Payer: Medicare Other

## 2015-11-16 ENCOUNTER — Encounter (HOSPITAL_COMMUNITY): Payer: Self-pay

## 2015-11-16 ENCOUNTER — Inpatient Hospital Stay (HOSPITAL_COMMUNITY)
Admission: EM | Admit: 2015-11-16 | Discharge: 2015-11-17 | DRG: 154 | Disposition: A | Discharge status: Home / Self Care | Payer: Medicare Other | Attending: Internal Medicine | Admitting: Internal Medicine

## 2015-11-16 DIAGNOSIS — Z981 Arthrodesis status: Secondary | ICD-10-CM

## 2015-11-16 DIAGNOSIS — I132 Hypertensive heart and chronic kidney disease with heart failure and with stage 5 chronic kidney disease, or end stage renal disease: Secondary | ICD-10-CM | POA: Diagnosis present

## 2015-11-16 DIAGNOSIS — M810 Age-related osteoporosis without current pathological fracture: Secondary | ICD-10-CM | POA: Diagnosis present

## 2015-11-16 DIAGNOSIS — Z79899 Other long term (current) drug therapy: Secondary | ICD-10-CM

## 2015-11-16 DIAGNOSIS — R4182 Altered mental status, unspecified: Secondary | ICD-10-CM

## 2015-11-16 DIAGNOSIS — F329 Major depressive disorder, single episode, unspecified: Secondary | ICD-10-CM | POA: Diagnosis present

## 2015-11-16 DIAGNOSIS — R269 Unspecified abnormalities of gait and mobility: Secondary | ICD-10-CM

## 2015-11-16 DIAGNOSIS — H8120 Vestibular neuronitis, unspecified ear: Secondary | ICD-10-CM | POA: Diagnosis not present

## 2015-11-16 DIAGNOSIS — Z940 Kidney transplant status: Secondary | ICD-10-CM

## 2015-11-16 DIAGNOSIS — K5900 Constipation, unspecified: Secondary | ICD-10-CM | POA: Diagnosis present

## 2015-11-16 DIAGNOSIS — I10 Essential (primary) hypertension: Secondary | ICD-10-CM | POA: Diagnosis present

## 2015-11-16 DIAGNOSIS — Z7982 Long term (current) use of aspirin: Secondary | ICD-10-CM | POA: Diagnosis not present

## 2015-11-16 DIAGNOSIS — R2990 Unspecified symptoms and signs involving the nervous system: Secondary | ICD-10-CM | POA: Diagnosis present

## 2015-11-16 DIAGNOSIS — R200 Anesthesia of skin: Secondary | ICD-10-CM

## 2015-11-16 DIAGNOSIS — I509 Heart failure, unspecified: Secondary | ICD-10-CM

## 2015-11-16 DIAGNOSIS — D631 Anemia in chronic kidney disease: Secondary | ICD-10-CM | POA: Diagnosis present

## 2015-11-16 DIAGNOSIS — H8121 Vestibular neuronitis, right ear: Secondary | ICD-10-CM

## 2015-11-16 DIAGNOSIS — I639 Cerebral infarction, unspecified: Secondary | ICD-10-CM | POA: Diagnosis not present

## 2015-11-16 DIAGNOSIS — M1990 Unspecified osteoarthritis, unspecified site: Secondary | ICD-10-CM | POA: Diagnosis present

## 2015-11-16 DIAGNOSIS — E785 Hyperlipidemia, unspecified: Secondary | ICD-10-CM | POA: Diagnosis present

## 2015-11-16 DIAGNOSIS — Z8673 Personal history of transient ischemic attack (TIA), and cerebral infarction without residual deficits: Secondary | ICD-10-CM | POA: Diagnosis not present

## 2015-11-16 DIAGNOSIS — R208 Other disturbances of skin sensation: Secondary | ICD-10-CM | POA: Diagnosis not present

## 2015-11-16 DIAGNOSIS — H8123 Vestibular neuronitis, bilateral: Secondary | ICD-10-CM | POA: Diagnosis not present

## 2015-11-16 DIAGNOSIS — R42 Dizziness and giddiness: Secondary | ICD-10-CM

## 2015-11-16 DIAGNOSIS — K219 Gastro-esophageal reflux disease without esophagitis: Secondary | ICD-10-CM | POA: Diagnosis present

## 2015-11-16 DIAGNOSIS — I5032 Chronic diastolic (congestive) heart failure: Secondary | ICD-10-CM | POA: Diagnosis present

## 2015-11-16 DIAGNOSIS — N186 End stage renal disease: Secondary | ICD-10-CM | POA: Diagnosis present

## 2015-11-16 DIAGNOSIS — E1122 Type 2 diabetes mellitus with diabetic chronic kidney disease: Secondary | ICD-10-CM | POA: Diagnosis present

## 2015-11-16 DIAGNOSIS — I517 Cardiomegaly: Secondary | ICD-10-CM | POA: Diagnosis not present

## 2015-11-16 DIAGNOSIS — F419 Anxiety disorder, unspecified: Secondary | ICD-10-CM | POA: Diagnosis present

## 2015-11-16 DIAGNOSIS — N2581 Secondary hyperparathyroidism of renal origin: Secondary | ICD-10-CM | POA: Diagnosis not present

## 2015-11-16 DIAGNOSIS — I6789 Other cerebrovascular disease: Secondary | ICD-10-CM | POA: Diagnosis not present

## 2015-11-16 DIAGNOSIS — Z87891 Personal history of nicotine dependence: Secondary | ICD-10-CM | POA: Diagnosis not present

## 2015-11-16 DIAGNOSIS — M1611 Unilateral primary osteoarthritis, right hip: Secondary | ICD-10-CM | POA: Diagnosis present

## 2015-11-16 DIAGNOSIS — H933X9 Disorders of unspecified acoustic nerve: Secondary | ICD-10-CM | POA: Diagnosis present

## 2015-11-16 DIAGNOSIS — Z992 Dependence on renal dialysis: Secondary | ICD-10-CM | POA: Diagnosis not present

## 2015-11-16 DIAGNOSIS — R202 Paresthesia of skin: Secondary | ICD-10-CM

## 2015-11-16 DIAGNOSIS — H8122 Vestibular neuronitis, left ear: Secondary | ICD-10-CM | POA: Diagnosis not present

## 2015-11-16 DIAGNOSIS — D509 Iron deficiency anemia, unspecified: Secondary | ICD-10-CM | POA: Diagnosis not present

## 2015-11-16 LAB — DIFFERENTIAL
BASOS ABS: 0 10*3/uL (ref 0.0–0.1)
BASOS PCT: 1 %
EOS ABS: 0.1 10*3/uL (ref 0.0–0.7)
Eosinophils Relative: 3 %
Lymphocytes Relative: 38 %
Lymphs Abs: 1.7 10*3/uL (ref 0.7–4.0)
Monocytes Absolute: 0.3 10*3/uL (ref 0.1–1.0)
Monocytes Relative: 8 %
NEUTROS PCT: 50 %
Neutro Abs: 2.2 10*3/uL (ref 1.7–7.7)

## 2015-11-16 LAB — COMPREHENSIVE METABOLIC PANEL
ALBUMIN: 3.4 g/dL — AB (ref 3.5–5.0)
ALT: 8 U/L — ABNORMAL LOW (ref 14–54)
AST: 23 U/L (ref 15–41)
Alkaline Phosphatase: 94 U/L (ref 38–126)
Anion gap: 14 (ref 5–15)
BUN: 9 mg/dL (ref 6–20)
CHLORIDE: 93 mmol/L — AB (ref 101–111)
CO2: 27 mmol/L (ref 22–32)
Calcium: 8.4 mg/dL — ABNORMAL LOW (ref 8.9–10.3)
Creatinine, Ser: 2.54 mg/dL — ABNORMAL HIGH (ref 0.44–1.00)
GFR calc Af Amer: 22 mL/min — ABNORMAL LOW (ref >60)
GFR calc non Af Amer: 19 mL/min — ABNORMAL LOW (ref >60)
GLUCOSE: 85 mg/dL (ref 65–99)
POTASSIUM: 3.5 mmol/L (ref 3.5–5.1)
Sodium: 134 mmol/L — ABNORMAL LOW (ref 135–145)
Total Bilirubin: 0.5 mg/dL (ref 0.3–1.2)
Total Protein: 7.4 g/dL (ref 6.5–8.1)

## 2015-11-16 LAB — CBC
HCT: 38.3 % (ref 36.0–46.0)
HEMOGLOBIN: 12.2 g/dL (ref 12.0–15.0)
MCH: 28.9 pg (ref 26.0–34.0)
MCHC: 31.9 g/dL (ref 30.0–36.0)
MCV: 90.8 fL (ref 78.0–100.0)
Platelets: 216 10*3/uL (ref 150–400)
RBC: 4.22 MIL/uL (ref 3.87–5.11)
RDW: 17.4 % — AB (ref 11.5–15.5)
WBC: 4.3 10*3/uL (ref 4.0–10.5)

## 2015-11-16 LAB — I-STAT CHEM 8, ED
BUN: 11 mg/dL (ref 6–20)
CHLORIDE: 94 mmol/L — AB (ref 101–111)
CREATININE: 2.5 mg/dL — AB (ref 0.44–1.00)
Calcium, Ion: 0.92 mmol/L — ABNORMAL LOW (ref 1.13–1.30)
Glucose, Bld: 88 mg/dL (ref 65–99)
HEMATOCRIT: 42 % (ref 36.0–46.0)
HEMOGLOBIN: 14.3 g/dL (ref 12.0–15.0)
POTASSIUM: 3.4 mmol/L — AB (ref 3.5–5.1)
Sodium: 137 mmol/L (ref 135–145)
TCO2: 31 mmol/L (ref 0–100)

## 2015-11-16 LAB — PROTIME-INR
INR: 1.06 (ref 0.00–1.49)
Prothrombin Time: 14 seconds (ref 11.6–15.2)

## 2015-11-16 LAB — ETHANOL

## 2015-11-16 LAB — APTT: APTT: 32 s (ref 24–37)

## 2015-11-16 LAB — I-STAT TROPONIN, ED: Troponin i, poc: 0.01 ng/mL (ref 0.00–0.08)

## 2015-11-16 LAB — TSH: TSH: 1.842 u[IU]/mL (ref 0.350–4.500)

## 2015-11-16 MED ORDER — CALCIUM CARBONATE ANTACID 500 MG PO CHEW: 200.0000 mg | CHEWABLE_TABLET | Freq: Three times a day (TID) | ORAL | Status: DC | PRN

## 2015-11-16 MED ORDER — ALLOPURINOL 100 MG PO TABS
150.0000 mg | ORAL_TABLET | Freq: Every day | ORAL | Status: DC
  Administered 2015-11-16: 150 mg via ORAL
  Filled 2015-11-16: qty 2

## 2015-11-16 MED ORDER — ASPIRIN EC 325 MG PO TBEC: 325.0000 mg | DELAYED_RELEASE_TABLET | Freq: Every day | ORAL | Status: DC

## 2015-11-16 MED ORDER — ASPIRIN 325 MG PO TABS
325.0000 mg | ORAL_TABLET | Freq: Every day | ORAL | Status: DC
  Administered 2015-11-16 – 2015-11-17 (×2): 325 mg via ORAL

## 2015-11-16 MED ORDER — SORBITOL 70 % PO SOLN
60.0000 mL | Freq: Every day | ORAL | Status: DC | PRN
  Filled 2015-11-16: qty 60

## 2015-11-16 MED ORDER — METHYLPREDNISOLONE SODIUM SUCC 125 MG IJ SOLR
125.0000 mg | Freq: Four times a day (QID) | INTRAMUSCULAR | Status: DC
  Administered 2015-11-16 – 2015-11-17 (×3): 125 mg via INTRAVENOUS
  Filled 2015-11-16 (×3): qty 2

## 2015-11-16 MED ORDER — STROKE: EARLY STAGES OF RECOVERY BOOK
Freq: Once | Status: AC
  Administered 2015-11-16: 1

## 2015-11-16 MED ORDER — CINACALCET HCL 30 MG PO TABS
30.0000 mg | ORAL_TABLET | Freq: Every day | ORAL | Status: DC
  Administered 2015-11-17: 30 mg via ORAL
  Filled 2015-11-16 (×2): qty 1

## 2015-11-16 MED ORDER — MINERAL OIL RE ENEM
1.0000 | ENEMA | Freq: Once | RECTAL | Status: AC
  Administered 2015-11-16: 1 via RECTAL
  Filled 2015-11-16: qty 1

## 2015-11-16 MED ORDER — HYDRALAZINE HCL 20 MG/ML IJ SOLN: 10.0000 mg | Freq: Four times a day (QID) | INTRAMUSCULAR | Status: DC | PRN

## 2015-11-16 MED ORDER — ALPRAZOLAM 0.25 MG PO TABS
0.2500 mg | ORAL_TABLET | Freq: Every day | ORAL | Status: DC | PRN
  Administered 2015-11-17: 0.25 mg via ORAL
  Filled 2015-11-16: qty 1

## 2015-11-16 MED ORDER — LABETALOL HCL 200 MG PO TABS
300.0000 mg | ORAL_TABLET | Freq: Two times a day (BID) | ORAL | Status: DC
  Administered 2015-11-16 – 2015-11-17 (×2): 300 mg via ORAL
  Filled 2015-11-16 (×2): qty 1

## 2015-11-16 MED ORDER — VALACYCLOVIR HCL 500 MG PO TABS
500.0000 mg | ORAL_TABLET | Freq: Every day | ORAL | Status: DC
  Administered 2015-11-17: 500 mg via ORAL
  Filled 2015-11-16 (×2): qty 1

## 2015-11-16 MED ORDER — RENA-VITE PO TABS
1.0000 | ORAL_TABLET | Freq: Every day | ORAL | Status: DC
  Administered 2015-11-16: 1 via ORAL
  Filled 2015-11-16: qty 1

## 2015-11-16 MED ORDER — PROMETHAZINE HCL 25 MG PO TABS: 25.0000 mg | ORAL_TABLET | Freq: Four times a day (QID) | ORAL | Status: DC | PRN

## 2015-11-16 MED ORDER — ZOLPIDEM TARTRATE 5 MG PO TABS
5.0000 mg | ORAL_TABLET | Freq: Every day | ORAL | Status: DC
  Administered 2015-11-16: 5 mg via ORAL
  Filled 2015-11-16: qty 1

## 2015-11-16 MED ORDER — HEPARIN SODIUM (PORCINE) 5000 UNIT/ML IJ SOLN
5000.0000 [IU] | Freq: Three times a day (TID) | INTRAMUSCULAR | Status: DC
  Administered 2015-11-16 – 2015-11-17 (×2): 5000 [IU] via SUBCUTANEOUS
  Filled 2015-11-16 (×2): qty 1

## 2015-11-16 MED ORDER — HYDROCODONE-ACETAMINOPHEN 5-325 MG PO TABS: 1.0000 | ORAL_TABLET | ORAL | Status: DC | PRN

## 2015-11-16 MED ORDER — ASPIRIN 325 MG PO TABS
325.0000 mg | ORAL_TABLET | Freq: Every day | ORAL | Status: DC
  Filled 2015-11-16 (×2): qty 1

## 2015-11-16 MED ORDER — SENNOSIDES-DOCUSATE SODIUM 8.6-50 MG PO TABS
1.0000 | ORAL_TABLET | Freq: Two times a day (BID) | ORAL | Status: DC
  Administered 2015-11-16: 1 via ORAL
  Filled 2015-11-16 (×2): qty 1

## 2015-11-16 MED ORDER — SIMVASTATIN 5 MG PO TABS
10.0000 mg | ORAL_TABLET | Freq: Every day | ORAL | Status: DC
  Administered 2015-11-16: 10 mg via ORAL
  Filled 2015-11-16: qty 2

## 2015-11-16 MED ORDER — ASPIRIN 300 MG RE SUPP: 300.0000 mg | Freq: Every day | RECTAL | Status: DC

## 2015-11-16 MED ORDER — CALCIUM CARBONATE ANTACID 500 MG PO CHEW
500.0000 mg | CHEWABLE_TABLET | Freq: Three times a day (TID) | ORAL | Status: DC
  Administered 2015-11-16 – 2015-11-17 (×3): 500 mg via ORAL
  Filled 2015-11-16 (×3): qty 3

## 2015-11-16 MED ORDER — TRAMADOL HCL 50 MG PO TABS: 50.0000 mg | ORAL_TABLET | Freq: Four times a day (QID) | ORAL | Status: DC | PRN

## 2015-11-16 NOTE — ED Notes (Signed)
Neurology at the bedside

## 2015-11-16 NOTE — H&P (Signed)
[facility] History and Physical                                                                                    [name], is a 64 y.o. female  MRN: [id number]   DOB - Oct 14, 1951  Admit Date - 11/16/2015  Outpatient Primary MD for the patient is Kathryn Hams, MD  Referring Physician:  Dr. Thomasene Lot  Chief Complaint:   Chief Complaint  Patient presents with  . Code Stroke     HPI  [name]  is a 64 y.o. female, with end-stage renal disease on dialysis Tuesday Thursday Saturday, hypertension, hyperlipidemia, diabetes who presents to the emergency department from dialysis for left-sided facial numbness. The patient reports she was in dialysis with about 45 minutes left ago when she developed intermittent left-sided facial numbness primarily on her lip and left jaw. She has had this sensation previously as well. Her only other complaints are ongoing dizziness and constipation. The patient reports that she has had multiple previous TIAs and strokes from which she has residual right-sided weakness. She takes a daily aspirin. Neurology was called to evaluate the patient and on exam found vesicles in her ears bilaterally.  In the emergency department her CT is positive for right sided thalamic stroke. Her vital signs are stable. Her labs are reassuring. MR brain is pending.  Review of Systems  Constitutional: Negative.   HENT: Negative.   Eyes: Negative.   Respiratory: Negative.   Cardiovascular: Negative.   Gastrointestinal: Positive for nausea and constipation.  Genitourinary: Negative.   Musculoskeletal: Negative.   Skin: Negative.   Neurological: Positive for dizziness and focal weakness.  Endo/Heme/Allergies: Negative.   Psychiatric/Behavioral: Negative.      Past Medical History  Past Medical History  Diagnosis Date  . Anxiety   . Blood transfusion   . Depression   . GERD (gastroesophageal reflux disease)   . Hyperlipidemia   . Hypertension   .  Neuromuscular disorder (HCC)     neuropathy hand and legs  . Weight loss, unintentional   . Adenomatous polyp of colon 10/2010  . Diverticula, colon   . CHF (congestive heart failure) ([place])   . 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  Had deceased donor kidney transplant in 1996.  Had some early rejection then stable function for years, then had slow decline of function and went back on hemodialysis in 2012.  Gets HD TTS schedule at [facility] on [facility] still using L forearm AVF.     Marland Kitchen Anemia in CKD (chronic kidney disease) 11/07/2012  . Arthritis   . Osteoporosis     Past Surgical History  Procedure Laterality Date  . Kidney transplant  1996  . Back surgery    . Cervical fusion    . Av fistula placement      for dialysis  . Cholecystectomy  12/02/2012    Procedure: LAPAROSCOPIC CHOLECYSTECTOMY WITH INTRAOPERATIVE CHOLANGIOGRAM;  Surgeon: Edward Jolly, MD;  Location: [facility];  Service: General;  Laterality: N/A;      Social History Social History  Substance Use Topics  . Smoking status: Former Smoker    Types: Cigarettes    Quit date: 12/31/1991  . Smokeless tobacco: Never Used  . Alcohol Use: No   independent with ADLs  Family History Family History  Problem Relation Age of Onset  . Colon cancer Brother   . Cancer Brother   . Coronary artery disease Mother 41  . Hyperlipidemia Mother   . Hypertension Mother   . Esophageal cancer Neg Hx   . Stomach cancer Neg Hx   . Rectal cancer Neg Hx     Prior to Admission medications   Medication Sig Start Date End Date Taking? Authorizing Provider  allopurinol (ZYLOPRIM) 300 MG tablet Take 150 mg by mouth at bedtime.    Historical Provider, MD  ALPRAZolam Duanne Moron) 0.25 MG tablet Take 0.25 mg by mouth daily as needed for anxiety.    Historical Provider, MD  aspirin 325 MG tablet Take 1 tablet (325 mg total) by mouth daily. 05/10/15    Velvet Bathe, MD  b complex-vitamin c-folic acid (NEPHRO-VITE) 0.8 MG TABS tablet Take 1 tablet by mouth at bedtime.    Historical Provider, MD  calcium carbonate (TUMS - DOSED IN MG ELEMENTAL CALCIUM) 500 MG chewable tablet Chew 1 tablet by mouth 3 (three) times daily as needed. For acid    Historical Provider, MD  calcium carbonate (TUMS) 500 MG chewable tablet Chew 500 mg by mouth 3 (three) times daily.    Historical Provider, MD  cinacalcet (SENSIPAR) 30 MG tablet Take 30 mg by mouth daily.    Historical Provider, MD  doxazosin (CARDURA) 8 MG tablet Take 1 tablet (8 mg total) by mouth 2 (two) times daily. Patient taking differently: Take 8 mg by mouth 2 (two) times daily as needed (hypertension).  11/14/14   Deanna M Didiano, DO  doxycycline (VIBRAMYCIN) 100 MG capsule 2 (two) times daily. 10/25/15   Historical Provider, MD  HYDROcodone-acetaminophen (NORCO) 5-325 MG per tablet Take 1 tablet by mouth every 4 (four) hours as needed for moderate pain. Patient taking differently: Take 1 tablet by mouth as needed for moderate pain.  03/03/15   Thao P Le, DO  labetalol (NORMODYNE) 300 MG tablet Take 300 mg by mouth 2 (two) times daily. 11/09/12   Velvet Bathe, MD  losartan (COZAAR) 100 MG tablet Take 100 mg by mouth at bedtime.     Historical Provider, MD  promethazine (PHENERGAN) 25 MG tablet Take 25 mg by mouth every 6 (six) hours as needed for nausea or vomiting.    Historical Provider, MD  simvastatin (ZOCOR) 10 MG tablet Take 10 mg by mouth at bedtime.     Historical Provider, MD  sorbitol 70 % solution Take 60 mLs by mouth daily as needed (constipation).     Historical Provider, MD  traMADol (ULTRAM) 50 MG tablet 2 (two) times daily. 10/25/15   Historical Provider, MD  zolpidem (AMBIEN) 10 MG tablet Take 10 mg by mouth at bedtime.    Historical Provider, MD    Allergies  Allergen Reactions  . Sulfa Antibiotics Other (See Comments)    Both parents allergic-so will not take    Physical  Exam  Vitals  Blood pressure 145/92, pulse 75, temperature 98.1 F (36.7 C), temperature source Oral, resp. rate 21, height 5\' 4"  (1.626 m), weight 59.7 kg (131 lb 9.8 oz), SpO2 100 %.   General: Thin, well-developed female lying in bed in NAD  Psych:  Normal affect and insight, Not Suicidal or  Homicidal, Awake Alert, Oriented X 3.  Neuro:  Decreased sensation on left side of lower lip and jaw, no tongue deviation, symmetric smile, right-sided extremities are slightly weaker than left  ENT: Eyes appear normal with pupils are equal and round, EOMI, throat shows no exudates or erythema, left ear with some wax but normal-appearing TM, right ear with 2  small fluid-filled vesicles in the canal.  Neck:  Supple, No lymphadenopathy appreciated  Respiratory:  Symmetrical chest wall movement, Good air movement bilaterally, CTAB.  Cardiac:  RRR, No Murmurs, no LE edema noted, no JVD.    Abdomen:  Positive bowel sounds, Soft, Non tender, Non distended,  No masses appreciated  Skin:  No Cyanosis, Normal Skin Turgor, No Skin Rash or Bruise.  Extremities:  Able to move all 4. 5/5 strength in each,  no effusions.  Data Review  Wt Readings from Last 3 Encounters:  11/16/15 59.7 kg (131 lb 9.8 oz)  10/28/15 59.875 kg (132 lb)  05/09/15 55.7 kg (122 lb 12.7 oz)    CBC  Recent Labs Lab 11/16/15 1150 11/16/15 1200  WBC 4.3  --   HGB 12.2 14.3  HCT 38.3 42.0  PLT 216  --   MCV 90.8  --   MCH 28.9  --   MCHC 31.9  --   RDW 17.4*  --   LYMPHSABS 1.7  --   MONOABS 0.3  --   EOSABS 0.1  --   BASOSABS 0.0  --     Chemistries   Recent Labs Lab 11/16/15 1150 11/16/15 1200  NA 134* 137  K 3.5 3.4*  CL 93* 94*  CO2 27  --   GLUCOSE 85 88  BUN 9 11  CREATININE 2.54* 2.50*  CALCIUM 8.4*  --   AST 23  --   ALT 8*  --   ALKPHOS 94  --   BILITOT 0.5  --      Lab Results  Component Value Date   HGBA1C 4.8 05/09/2015    CREATININE: 2.5 mg/dL ABNORMAL (11/16/15  1200) Estimated creatinine clearance - 19.4 mL/min    Coagulation profile  Recent Labs Lab 11/16/15 1150  INR 1.06    Imaging results:   Ct Head Wo Contrast  11/16/2015  CLINICAL DATA:  Left-sided numbness and weakness. History of hypertension and TIA. Code stroke. Initial encounter. EXAM: CT HEAD WITHOUT CONTRAST TECHNIQUE: Contiguous axial images were obtained from the base of the skull through the vertex without intravenous contrast. COMPARISON:  Head CT 05/09/2015.  MRI brain 05/09/2015. FINDINGS: There is no evidence of acute intracranial hemorrhage, mass lesion, brain edema or extra-axial fluid collection. There is patchy low-density in the periventricular white matter with an asymmetric component in the right frontal region which appears unchanged. Small lacunar infarct within the posterior right corona radiata on image number 17 is unchanged. There is a new small low-density lesion in the right thalamus on image 13. No evidence of acute cortical based infarct. Intracranial vascular calcifications are noted. The visualized paranasal sinuses, mastoid air cells and middle ears are clear. The calvarium is intact. IMPRESSION: 1. New age-indeterminate right thalamic stroke, appearing at least subacute in age. 2. Otherwise stable periventricular white matter disease. No CT evidence of acute cortical stroke or hemorrhage. 3. These results were called by telephone at the time of interpretation on 11/16/2015 at 12:02 pm to Dr. Silverio Decamp , who verbally acknowledged these results. Electronically Signed   By: Richardean Sale M.D.   On: 11/16/2015 12:06  My personal review of EKG: Pending   Assessment & Plan  Principal Problem:   Left facial numbness Active Problems:   ESRD (end stage renal disease) (HCC)   GERD (gastroesophageal reflux disease)   HTN (hypertension)   Dizziness   H/O: CVA (cerebrovascular accident)   Congestive heart disease (HCC)   Stroke-like symptom   Vestibular  neuritis   Left facial numbness with right thalamic stroke noted on CT head Will admit to telemetry and move forward with stroke workup including MR brain, carotids, 2-D echo, serologies, PT/OT/speech  Patient is on 325 mg aspirin daily. Appreciate neurology recommendations.   Possible vestibular neuritis Vesicles noted in right ear canal.  Valtrex and Solu-Medrol written by neurology. Negative pressure room.  Patient with ongoing ataxia and dizziness PT evaluation requested  History of diastolic heart failure Most recent LVEF in May 2016 was 60%. No signs of volume overload at this point.  Hypertension Hold ARB and Cardura. To allow for permissive hypertension given acute stroke. We'll continue labetalol and add hydralazine to be given if SBP is greater than 200.  End-stage renal disease Nephrology has not yet been called. Patient received three quarters of a session of dialysis today. Next scheduled dialysis session is Tuesday.  Constipation Will order a mineral oil enema and Senokot.   Consultants Called:    Neurology  Family Communication:     Patient is alert, orientated and understands their plan of care.  Code Status:    Full  Condition:    Guarded  Potential Disposition:   To home hopefully in 24-48 hours.  Time spent in minutes : [place],  [place] on 11/16/2015 at 3:20 PM Between 7am to 7pm - Pager - [phone] After 7pm go to [email or web address] - password [email or web address] And look for the night coverage person covering me after hours

## 2015-11-16 NOTE — ED Notes (Signed)
Ginger, RN made aware pt has been NPO and has not passed Swallow Screen. Verbalized understanding and stated she would complete.

## 2015-11-16 NOTE — Consult Note (Signed)
NEURO HOSPITALIST CONSULT NOTE   Requestig physician: Dr. Thomasene Lot, In ED   Reason for Consult: Left facial paresthesias, rule out stroke.   HPI:                                                                                                                                          [name] is an 64 y.o. female with end-stage renal failure, hemodialysis 3 days a week. While she was in the hemodialysis today at about 10 AM, she noticed some paresthesias involving her left face and left arm. Predominantly on the left face though. As she reported this to her nurse, she was sent to the ER for further evaluation and to rule out a stroke. Patient denies any speech problems, no vision issues no focal motor weakness in upper or lower extremities.  She reported having dizziness and vertigo symptoms for the past few days and some gait instability. Also reported having some mild left ear pain.   Past Medical History  Diagnosis Date  . Anxiety   . Blood transfusion   . Depression   . GERD (gastroesophageal reflux disease)   . Hyperlipidemia   . Hypertension   . Neuromuscular disorder (HCC)     neuropathy hand and legs  . Weight loss, unintentional   . Adenomatous polyp of colon 10/2010  . Diverticula, colon   . CHF (congestive heart failure) ([place])   . 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  Had deceased donor kidney transplant in 1996.  Had some early rejection then stable function for years, then had slow decline of function and went back on hemodialysis in 2012.  Gets HD TTS schedule at [facility] on [facility] still using L forearm AVF.     Marland Kitchen Anemia in CKD (chronic kidney disease) 11/07/2012  . Arthritis   . Osteoporosis     Past Surgical History  Procedure Laterality Date  . Kidney transplant  1996  . Back surgery    . Cervical fusion    . Av fistula placement      for  dialysis  . Cholecystectomy  12/02/2012    Procedure: LAPAROSCOPIC CHOLECYSTECTOMY WITH INTRAOPERATIVE CHOLANGIOGRAM;  Surgeon: Edward Jolly, MD;  Location: [facility] OR;  Service: General;  Laterality: N/A;    Family History  Problem Relation Age of Onset  . Colon cancer Brother   . Cancer Brother   . Coronary artery disease Mother 84  . Hyperlipidemia Mother   . Hypertension Mother   . Esophageal cancer Neg Hx   . Stomach cancer Neg Hx   . Rectal cancer Neg Hx      Social History:  reports that she quit  smoking about 23 years ago. Her smoking use included Cigarettes. She has never used smokeless tobacco. She reports that she does not drink alcohol or use illicit drugs.  Allergies  Allergen Reactions  . Sulfa Antibiotics Other (See Comments)    Both parents allergic-so will not take    MEDICATIONS:                                                                                                                      Current facility-administered medications:  .   stroke: mapping our early stages of recovery book, , Does not apply, Once, Melton Alar, PA-C .  allopurinol (ZYLOPRIM) tablet 150 mg, 150 mg, Oral, QHS, Marianne L York, PA-C .  ALPRAZolam (XANAX) tablet 0.25 mg, 0.25 mg, Oral, Daily PRN, Bobby Rumpf York, PA-C .  aspirin EC tablet 325 mg, 325 mg, Oral, Daily, Jakevion Arney Fuller Mandril, MD .  aspirin suppository 300 mg, 300 mg, Rectal, Daily **OR** aspirin tablet 325 mg, 325 mg, Oral, Daily, Melton Alar, PA-C, 325 mg at 11/16/15 1818 .  aspirin tablet 325 mg, 325 mg, Oral, Daily, Melton Alar, PA-C, 0 mg at 11/16/15 1615 .  calcium carbonate (TUMS - dosed in mg elemental calcium) chewable tablet 200 mg of elemental calcium, 200 mg of elemental calcium, Oral, TID PRN, Melton Alar, PA-C .  calcium carbonate (TUMS - dosed in mg elemental calcium) chewable tablet 500 mg, 500 mg, Oral, TID, Melton Alar, PA-C, 500 mg at 11/16/15 1814 .  [START ON 11/17/2015]  cinacalcet (SENSIPAR) tablet 30 mg, 30 mg, Oral, Q breakfast, Marianne L York, PA-C .  heparin injection 5,000 Units, 5,000 Units, Subcutaneous, 3 times per day, Melton Alar, PA-C, 5,000 Units at 11/16/15 1813 .  hydrALAZINE (APRESOLINE) injection 10 mg, 10 mg, Intravenous, Q6H PRN, Melton Alar, PA-C .  HYDROcodone-acetaminophen (NORCO/VICODIN) 5-325 MG per tablet 1 tablet, 1 tablet, Oral, Q4H PRN, Bobby Rumpf York, PA-C .  labetalol (NORMODYNE) tablet 300 mg, 300 mg, Oral, BID, Marianne L York, PA-C .  methylPREDNISolone sodium succinate (SOLU-MEDROL) 125 mg/2 mL injection 125 mg, 125 mg, Intravenous, Q6H, Masen Salvas Fuller Mandril, MD, 125 mg at 11/16/15 1813 .  multivitamin (RENA-VIT) tablet 1 tablet, 1 tablet, Oral, QHS, Marianne L York, PA-C .  promethazine (PHENERGAN) tablet 25 mg, 25 mg, Oral, Q6H PRN, Melton Alar, PA-C .  senna-docusate (Senokot-S) tablet 1 tablet, 1 tablet, Oral, BID, Melton Alar, PA-C .  simvastatin (ZOCOR) tablet 10 mg, 10 mg, Oral, QHS, Marianne L York, PA-C .  sorbitol 70 % solution 60 mL, 60 mL, Oral, Daily PRN, Melton Alar, PA-C .  traMADol (ULTRAM) tablet 50 mg, 50 mg, Oral, Q6H PRN, Melton Alar, PA-C .  valACYclovir (VALTREX) tablet 500 mg, 500 mg, Oral, Daily, Torah Pinnock Fuller Mandril, MD .  zolpidem (AMBIEN) tablet 5 mg, 5 mg, Oral, QHS, Marianne L York, PA-C    ROS:  History obtained from the patient  General ROS: negative for - chills, fatigue, fever, night sweats, weight gain or weight loss Psychological ROS: negative for - behavioral disorder, hallucinations, memory difficulties, mood swings or suicidal ideation Ophthalmic ROS: negative for - blurry vision, double vision, eye pain or loss of vision ENT ROS: negative for - epistaxis, nasal discharge, oral lesions, sore throat, tinnitus or  vertigo Allergy and Immunology ROS: negative for - hives or itchy/watery eyes Hematological and Lymphatic ROS: negative for - bleeding problems, bruising or swollen lymph nodes Endocrine ROS: negative for - galactorrhea, hair pattern changes, polydipsia/polyuria or temperature intolerance Respiratory ROS: negative for - cough, hemoptysis, shortness of breath or wheezing Cardiovascular ROS: negative for - chest pain, dyspnea on exertion, edema or irregular heartbeat Gastrointestinal ROS: negative for - abdominal pain, diarrhea, hematemesis, nausea/vomiting or stool incontinence Genito-Urinary ROS: negative for - dysuria, hematuria, incontinence or urinary frequency/urgency Musculoskeletal ROS: negative for - joint swelling or muscular weakness Neurological ROS: as noted in HPI Dermatological ROS: negative for rash and skin lesion changes   Blood pressure 168/106, pulse 72, temperature 98.9 F (37.2 C), temperature source Oral, resp. rate 21, height 5\' 4"  (1.626 m), weight 59.7 kg (131 lb 9.8 oz), SpO2 100 %.   Neurologic Examination:                                                                                                        Otoscopic examination revealed tender erythematous and vesicular HSV lesions in bilateral ear canals, worse on the left.   Neurological Examination Mental Status: Alert, oriented, thought content appropriate.  Speech fluent without evidence of aphasia.  Able to follow 3 step commands without difficulty. Cranial Nerves: II:  Visual fields grossly normal, pupils equal, round, reactive to light and accommodation III,IV, VI: ptosis not present, extra-ocular motions intact bilaterally V,VII: smile symmetric, facial light touch sensation normal bilaterally. No definite evidence of any facial weakness .  VIII: hearing normal bilaterally IX,X: uvula rises symmetrically XI: bilateral shoulder shrug XII: midline tongue extension Motor: Right : Upper extremity    5/5    Left:     Upper extremity   5/5  Lower extremity   5/5     Lower extremity   5/5 Tone and bulk:normal tone throughout; no atrophy noted Sensory: Pinprick and light touch intact proximally, bilaterally. Has mild length-dependent sensory loss in the lower extremities up to lower tibial level bilaterally symmetric . No sensory abnormality is on the face during exam Deep Tendon Reflexes: 2+ and symmetric throughout Plantars: Right: downgoing   Left: downgoing Cerebellar: normal finger-to-nose.  Gait: Wide based, moderate gait instability and severe vertigo when standing up      Lab Results: Basic Metabolic Panel:  Recent Labs Lab 11/16/15 1150 11/16/15 1200  NA 134* 137  K 3.5 3.4*  CL 93* 94*  CO2 27  --   GLUCOSE 85 88  BUN 9 11  CREATININE 2.54* 2.50*  CALCIUM 8.4*  --     Liver Function Tests:  Recent Labs Lab 11/16/15 1150  AST  23  ALT 8*  ALKPHOS 94  BILITOT 0.5  PROT 7.4  ALBUMIN 3.4*   No results for input(s): LIPASE, AMYLASE in the last 168 hours. No results for input(s): AMMONIA in the last 168 hours.  CBC:  Recent Labs Lab 11/16/15 1150 11/16/15 1200  WBC 4.3  --   NEUTROABS 2.2  --   HGB 12.2 14.3  HCT 38.3 42.0  MCV 90.8  --   PLT 216  --     Cardiac Enzymes: No results for input(s): CKTOTAL, CKMB, CKMBINDEX, TROPONINI in the last 168 hours.  Lipid Panel: No results for input(s): CHOL, TRIG, HDL, CHOLHDL, VLDL, LDLCALC in the last 168 hours.  CBG: No results for input(s): GLUCAP in the last 168 hours.  Microbiology: Results for orders placed or performed during the hospital encounter of 05/08/15  Surgical PCR screen     Status: None   Collection Time: 05/09/15  2:37 PM  Result Value Ref Range Status   MRSA, PCR NEGATIVE NEGATIVE Final   Staphylococcus aureus NEGATIVE NEGATIVE Final    Comment:        The Xpert SA Assay (FDA approved for NASAL specimens in patients over 33 years of age), is one component of a comprehensive  surveillance program.  Test performance has been validated by [facility] for patients greater than or equal to 19 year old. It is not intended to diagnose infection nor to guide or monitor treatment.     Coagulation Studies:  Recent Labs  11/16/15 1150  LABPROT 14.0  INR 1.06    Imaging: Ct Head Wo Contrast  11/16/2015  CLINICAL DATA:  Left-sided numbness and weakness. History of hypertension and TIA. Code stroke. Initial encounter. EXAM: CT HEAD WITHOUT CONTRAST TECHNIQUE: Contiguous axial images were obtained from the base of the skull through the vertex without intravenous contrast. COMPARISON:  Head CT 05/09/2015.  MRI brain 05/09/2015. FINDINGS: There is no evidence of acute intracranial hemorrhage, mass lesion, brain edema or extra-axial fluid collection. There is patchy low-density in the periventricular white matter with an asymmetric component in the right frontal region which appears unchanged. Small lacunar infarct within the posterior right corona radiata on image number 17 is unchanged. There is a new small low-density lesion in the right thalamus on image 13. No evidence of acute cortical based infarct. Intracranial vascular calcifications are noted. The visualized paranasal sinuses, mastoid air cells and middle ears are clear. The calvarium is intact. IMPRESSION: 1. New age-indeterminate right thalamic stroke, appearing at least subacute in age. 2. Otherwise stable periventricular white matter disease. No CT evidence of acute cortical stroke or hemorrhage. 3. These results were called by telephone at the time of interpretation on 11/16/2015 at 12:02 pm to Dr. Silverio Decamp , who verbally acknowledged these results. Electronically Signed   By: Richardean Sale M.D.   On: 11/16/2015 12:06   Mr Brain Wo Contrast  11/16/2015  CLINICAL DATA:  Left-sided facial numbness. End-stage renal disease. History of hypertension. EXAM: MRI HEAD WITHOUT CONTRAST TECHNIQUE: Multiplanar, multiecho  pulse sequences of the brain and surrounding structures were obtained without intravenous contrast. COMPARISON:  CT head earlier today FINDINGS: The patient was unable to remain motionless for the exam. Small or subtle lesions could be overlooked. No evidence for acute stroke, acute hemorrhage, mass lesion, hydrocephalus, or extra-axial fluid. Premature for age cerebral and cerebellar atrophy. Extensive T2 and FLAIR hyperintensities throughout the periventricular and subcortical white matter representing small vessel disease. Scattered areas of chronic lacunar infarction affect  the BILATERAL thalami, BILATERAL basal ganglia, and periventricular white matter. Flow voids are maintained. There are numerous foci of susceptibility on gradient hemorrhage, throughout the cerebral hemispheres, deep nuclei, cerebellum, and brainstem representing foci of microhemorrhage. Constellation of microbleeds and white matter disease is consistent with longstanding hypertensive cerebrovascular disease. Pituitary and cerebellar tonsils unremarkable. Previous cervical fusion with instrumentation. Adjacent segment disease suspected at C2-C3 with mild stenosis. Extracranial soft tissues show mild chronic sinus disease but no acute findings. The areas questioned on CT earlier today represent chronic lacunar which have developed since the prior CT and MR 05/09/2015. IMPRESSION: Atrophy, small vessel disease, and widespread areas of chronic microhemorrhage consistent with longstanding hypertensive cerebral vascular disease. No acute intracranial findings. Specifically no acute RIGHT hemisphere stroke. Electronically Signed   By: Staci Righter M.D.   On: 11/16/2015 15:27   Dg Chest Port 1 View  11/16/2015  CLINICAL DATA:  CVA EXAM: PORTABLE CHEST 1 VIEW COMPARISON:  01/22/2015 chest radiograph. FINDINGS: Right internal jugular central venous catheter terminates in the middle third of the superior vena cava. Stable cardiomediastinal  silhouette with mild cardiomegaly. No pneumothorax. No pleural effusion. There are nonspecific mild dense reticular opacities in the peripheral upper and basilar right lung, unchanged since 04/01/2013 chest CT angiogram, with an appearance most suggestive of tiny parenchymal calcifications such as from prior barium aspiration or granulomatous infection. No pulmonary edema. No new focal lung opacity. IMPRESSION: 1. Right internal jugular central venous catheter terminates in the middle third of the SVC. No pneumothorax. 2. Stable mild cardiomegaly without overt pulmonary edema. Electronically Signed   By: Ilona Sorrel M.D.   On: 11/16/2015 19:53      Assessment/Plan:   64 year old female patient with end-stage renal failure, experience some subjective numbness and paresthesias in her left face and left arm today. During the evaluation with no definite evidence of any sensory abnormality 7 noted no focal neurological signs. Otoscopic examination revealed [place] lesions and bilateral ear canals and patient was noted to have living and gait instability. It is likely completed secondary to the registry associated with severe neuritis.  Based on clinical evaluation I do not suspect an acute stroke. Given her significant vascular risk factors including hemodialysis with possible hypotension which can increase the risk for watershed infarcts, recommend a brain MRI study to rule out any subclinical small infarcts.  At the time of finalizing this note, MRI brain has been completed which did not show any evidence of acute infarcts.   For Symptomatic management of the vestibular neuritis, recommend IV Solu-Medrol 125 mg every 6 hours and valacyclovir dosed for end-stage renal failure, 500 mg daily. If she is able to ambulate independently with physical therapy evaluation, she can be discharged home tomorrow with a Medrol Dosepak and 6 more days of valacyclovir prescriptions.  Neurology service will  continue to follow up during her hospitalization. Please call for any further questions.

## 2015-11-16 NOTE — Progress Notes (Signed)
VASCULAR LAB PRELIMINARY  PRELIMINARY  PRELIMINARY  PRELIMINARY  Carotid duplex completed.    Preliminary report:  1-39% ICA plaquing.  Vertebral artery flow is antegrade.   Dewaine Morocho, RVT 11/16/2015, 5:01 PM

## 2015-11-16 NOTE — ED Provider Notes (Signed)
CSN: [id number]     Arrival date & time 11/16/15  1140 History   First MD Initiated Contact with Patient 11/16/15 1212     Chief Complaint  Patient presents with  . Code Stroke    An emergency department physician performed an initial assessment on this suspected stroke patient at 15. (Consider location/radiation/quality/duration/timing/severity/associated sxs/prior Treatment) HPI  Patient is a 64 year old female presenting with left sided facial numbness. Patient is vascular path, end-stage renal disease, neuropathy of hands and feet, congestive heart failure, hypertension hyperlipidemia and chronic anemia. She is presenting today with left facial numbness started at 10:30 AM she was called a code stroke.  Patient has no pronator drift. Mild left sided facial weakness. She reports numbness but sensation to touch is intact.   Past Medical History  Diagnosis Date  . Anxiety   . Blood transfusion   . Depression   . GERD (gastroesophageal reflux disease)   . Hyperlipidemia   . Hypertension   . Neuromuscular disorder (HCC)     neuropathy hand and legs  . Weight loss, unintentional   . Adenomatous polyp of colon 10/2010  . Diverticula, colon   . CHF (congestive heart failure) ([place])   . 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  Had deceased donor kidney transplant in 1996.  Had some early rejection then stable function for years, then had slow decline of function and went back on hemodialysis in 2012.  Gets HD TTS schedule at [facility] on [facility] still using L forearm AVF.     Marland Kitchen Anemia in CKD (chronic kidney disease) 11/07/2012  . Arthritis   . Osteoporosis    Past Surgical History  Procedure Laterality Date  . Kidney transplant  1996  . Back surgery    . Cervical fusion    . Av fistula placement      for dialysis  . Cholecystectomy  12/02/2012    Procedure: LAPAROSCOPIC CHOLECYSTECTOMY  WITH INTRAOPERATIVE CHOLANGIOGRAM;  Surgeon: Edward Jolly, MD;  Location: [facility] OR;  Service: General;  Laterality: N/A;   Family History  Problem Relation Age of Onset  . Colon cancer Brother   . Cancer Brother   . Coronary artery disease Mother 42  . Hyperlipidemia Mother   . Hypertension Mother   . Esophageal cancer Neg Hx   . Stomach cancer Neg Hx   . Rectal cancer Neg Hx    Social History  Substance Use Topics  . Smoking status: Former Smoker    Types: Cigarettes    Quit date: 12/31/1991  . Smokeless tobacco: Never Used  . Alcohol Use: No   OB History    No data available     Review of Systems  Constitutional: Negative for activity change.  HENT: Negative for congestion.   Eyes: Negative for discharge.  Respiratory: Negative for cough.   Cardiovascular: Negative for chest pain.  Gastrointestinal: Negative for abdominal distention.  Genitourinary: Negative for dysuria.  Musculoskeletal: Negative for joint swelling.  Skin: Negative for rash.  Allergic/Immunologic: Negative for immunocompromised state.  Neurological: Positive for numbness.  Psychiatric/Behavioral: Negative for confusion.      Allergies  Sulfa antibiotics  Home Medications   Prior to Admission medications   Medication Sig Start Date End Date Taking? Authorizing Provider  allopurinol (ZYLOPRIM) 300 MG tablet Take 150 mg by mouth at bedtime.    Historical Provider, MD  ALPRAZolam Duanne Moron) 0.25 MG tablet  Take 0.25 mg by mouth daily as needed for anxiety.    Historical Provider, MD  aspirin 325 MG tablet Take 1 tablet (325 mg total) by mouth daily. 05/10/15   Velvet Bathe, MD  b complex-vitamin c-folic acid (NEPHRO-VITE) 0.8 MG TABS tablet Take 1 tablet by mouth at bedtime.    Historical Provider, MD  calcium carbonate (TUMS - DOSED IN MG ELEMENTAL CALCIUM) 500 MG chewable tablet Chew 1 tablet by mouth 3 (three) times daily as needed. For acid    Historical Provider, MD  calcium carbonate (TUMS)  500 MG chewable tablet Chew 500 mg by mouth 3 (three) times daily.    Historical Provider, MD  cinacalcet (SENSIPAR) 30 MG tablet Take 30 mg by mouth daily.    Historical Provider, MD  doxazosin (CARDURA) 8 MG tablet Take 1 tablet (8 mg total) by mouth 2 (two) times daily. Patient taking differently: Take 8 mg by mouth 2 (two) times daily as needed (hypertension).  11/14/14   Deanna M Didiano, DO  doxycycline (VIBRAMYCIN) 100 MG capsule 2 (two) times daily. 10/25/15   Historical Provider, MD  HYDROcodone-acetaminophen (NORCO) 5-325 MG per tablet Take 1 tablet by mouth every 4 (four) hours as needed for moderate pain. Patient taking differently: Take 1 tablet by mouth as needed for moderate pain.  03/03/15   Thao P Le, DO  labetalol (NORMODYNE) 300 MG tablet Take 300 mg by mouth 2 (two) times daily. 11/09/12   Velvet Bathe, MD  losartan (COZAAR) 100 MG tablet Take 100 mg by mouth at bedtime.     Historical Provider, MD  promethazine (PHENERGAN) 25 MG tablet Take 25 mg by mouth every 6 (six) hours as needed for nausea or vomiting.    Historical Provider, MD  simvastatin (ZOCOR) 10 MG tablet Take 10 mg by mouth at bedtime.     Historical Provider, MD  sorbitol 70 % solution Take 60 mLs by mouth daily as needed (constipation).     Historical Provider, MD  traMADol (ULTRAM) 50 MG tablet 2 (two) times daily. 10/25/15   Historical Provider, MD  zolpidem (AMBIEN) 10 MG tablet Take 10 mg by mouth at bedtime.    Historical Provider, MD   BP 137/85 mmHg  Pulse 71  Temp(Src) 98.1 F (36.7 C) (Oral)  Resp 12  Ht 5\' 4"  (1.626 m)  Wt 131 lb 9.8 oz (59.7 kg)  BMI 22.58 kg/m2  SpO2 100% Physical Exam  Constitutional: She is oriented to person, place, and time. She appears well-developed and well-nourished.  HENT:  Head: Normocephalic and atraumatic.  Vesicles bilateral ears.  Mild facial weakness on left.  Eyes: Conjunctivae are normal. Right eye exhibits no discharge.  Neck: Neck supple.   Cardiovascular: Normal rate, regular rhythm and normal heart sounds.   No murmur heard. Pulmonary/Chest: Effort normal and breath sounds normal. She has no wheezes. She has no rales.  Abdominal: Soft. She exhibits no distension. There is no tenderness.  Musculoskeletal: She exhibits no edema.  Neurological: She is oriented to person, place, and time. No cranial nerve deficit.  No pronator drift. Unsteady ambulation.  Skin: Skin is warm and dry. No rash noted. She is not diaphoretic.  Psychiatric: She has a normal mood and affect.  Nursing note and vitals reviewed.   ED Course  Procedures (including critical care time) Labs Review Labs Reviewed  CBC - Abnormal; Notable for the following:    RDW 17.4 (*)    All other components within normal limits  COMPREHENSIVE  METABOLIC PANEL - Abnormal; Notable for the following:    Sodium 134 (*)    Chloride 93 (*)    Creatinine, Ser 2.54 (*)    Calcium 8.4 (*)    Albumin 3.4 (*)    ALT 8 (*)    GFR calc non Af Amer 19 (*)    GFR calc Af Amer 22 (*)    All other components within normal limits  I-STAT CHEM 8, ED - Abnormal; Notable for the following:    Potassium 3.4 (*)    Chloride 94 (*)    Creatinine, Ser 2.50 (*)    Calcium, Ion 0.92 (*)    All other components within normal limits  ETHANOL  PROTIME-INR  APTT  DIFFERENTIAL  URINE RAPID DRUG SCREEN, HOSP PERFORMED  URINALYSIS, ROUTINE W REFLEX MICROSCOPIC (NOT AT [facility])  I-STAT TROPOININ, ED    Imaging Review Ct Head Wo Contrast  11/16/2015  CLINICAL DATA:  Left-sided numbness and weakness. History of hypertension and TIA. Code stroke. Initial encounter. EXAM: CT HEAD WITHOUT CONTRAST TECHNIQUE: Contiguous axial images were obtained from the base of the skull through the vertex without intravenous contrast. COMPARISON:  Head CT 05/09/2015.  MRI brain 05/09/2015. FINDINGS: There is no evidence of acute intracranial hemorrhage, mass lesion, brain edema or extra-axial fluid  collection. There is patchy low-density in the periventricular white matter with an asymmetric component in the right frontal region which appears unchanged. Small lacunar infarct within the posterior right corona radiata on image number 17 is unchanged. There is a new small low-density lesion in the right thalamus on image 13. No evidence of acute cortical based infarct. Intracranial vascular calcifications are noted. The visualized paranasal sinuses, mastoid air cells and middle ears are clear. The calvarium is intact. IMPRESSION: 1. New age-indeterminate right thalamic stroke, appearing at least subacute in age. 2. Otherwise stable periventricular white matter disease. No CT evidence of acute cortical stroke or hemorrhage. 3. These results were called by telephone at the time of interpretation on 11/16/2015 at 12:02 pm to Dr. Silverio Decamp , who verbally acknowledged these results. Electronically Signed   By: Richardean Sale M.D.   On: 11/16/2015 12:06   I have personally reviewed and evaluated these images and lab results as part of my medical decision-making.   EKG Interpretation None      MDM   Final diagnoses:  Numbness and tingling of left side of face    Patient is a 64 year old female presenting with left facial numbness. Patient initially call code stroke. Neurology to bedside. CT shows  new right thalamic stroke, subacute. Recommend admission for stroke workup. MRI. PT/OT.  Neurology noted vesicles in bilateral ears.And initiating medication for shingles.   Ysabel Cowgill Julio Alm, MD 11/16/15 1520

## 2015-11-16 NOTE — ED Notes (Signed)
Per EMS, Patient is coming from Dialysis this morning where she received most of her treatment. Pt was normal when she arrived to Dialysis and then subjectively complained of left-sdied facial numbness. Dialysis Staff reported no objective symptoms with patient. When Patient arrived to ED, Pt report left-sided numbness and slight left-sided droop. Pt is alert and oriented x4. Pt has Hx of CVA, TIA, and Renal Failure. Vitals per EMS: 74 HR, 152/86, 76 CBG

## 2015-11-16 NOTE — Significant Event (Signed)
Rapid Response Event Note  Overview:    Called to see patient for Code Stroke.  Initial Focused Assessment: Assisted with CT transport and initial NIHSS assessment. Symptoms are minimal. NIHSS 0  Interventions:   Event Summary:   at      at          Baron Hamper

## 2015-11-17 DIAGNOSIS — H8120 Vestibular neuronitis, unspecified ear: Secondary | ICD-10-CM

## 2015-11-17 DIAGNOSIS — H8122 Vestibular neuronitis, left ear: Secondary | ICD-10-CM

## 2015-11-17 LAB — BASIC METABOLIC PANEL
Anion gap: 15 (ref 5–15)
BUN: 28 mg/dL — AB (ref 6–20)
CHLORIDE: 94 mmol/L — AB (ref 101–111)
CO2: 24 mmol/L (ref 22–32)
CREATININE: 4.4 mg/dL — AB (ref 0.44–1.00)
Calcium: 9.8 mg/dL (ref 8.9–10.3)
GFR calc non Af Amer: 10 mL/min — ABNORMAL LOW (ref >60)
GFR, EST AFRICAN AMERICAN: 11 mL/min — AB (ref >60)
GLUCOSE: 160 mg/dL — AB (ref 65–99)
Potassium: 4.6 mmol/L (ref 3.5–5.1)
Sodium: 133 mmol/L — ABNORMAL LOW (ref 135–145)

## 2015-11-17 LAB — LIPID PANEL
Cholesterol: 168 mg/dL (ref 0–200)
HDL: 55 mg/dL (ref >40)
LDL CALC: 103 mg/dL — AB (ref 0–99)
TRIGLYCERIDES: 50 mg/dL (ref <150)
Total CHOL/HDL Ratio: 3.1 RATIO
VLDL: 10 mg/dL (ref 0–40)

## 2015-11-17 MED ORDER — METHYLPREDNISOLONE 4 MG PO TBPK: 4.0000 mg | ORAL_TABLET | Freq: Four times a day (QID) | ORAL | Status: DC

## 2015-11-17 MED ORDER — METHYLPREDNISOLONE 4 MG PO TBPK: 4.0000 mg | ORAL_TABLET | ORAL | Status: DC

## 2015-11-17 MED ORDER — METHYLPREDNISOLONE 4 MG PO TBPK: ORAL_TABLET | ORAL | Status: DC

## 2015-11-17 MED ORDER — DOXAZOSIN MESYLATE 8 MG PO TABS: 8.0000 mg | ORAL_TABLET | Freq: Two times a day (BID) | ORAL | Status: DC | PRN

## 2015-11-17 MED ORDER — UNABLE TO FIND: Status: DC

## 2015-11-17 MED ORDER — METHYLPREDNISOLONE 4 MG PO TBPK
8.0000 mg | ORAL_TABLET | Freq: Every morning | ORAL | Status: DC
  Filled 2015-11-17: qty 21

## 2015-11-17 MED ORDER — METHYLPREDNISOLONE 4 MG PO TBPK: 4.0000 mg | ORAL_TABLET | Freq: Three times a day (TID) | ORAL | Status: DC

## 2015-11-17 MED ORDER — METHYLPREDNISOLONE 4 MG PO TBPK: 8.0000 mg | ORAL_TABLET | Freq: Every evening | ORAL | Status: DC

## 2015-11-17 MED ORDER — VALACYCLOVIR HCL 500 MG PO TABS: 500.0000 mg | ORAL_TABLET | Freq: Every day | ORAL | Status: DC

## 2015-11-17 MED ORDER — HYDROCODONE-ACETAMINOPHEN 5-325 MG PO TABS: 1.0000 | ORAL_TABLET | Freq: Four times a day (QID) | ORAL | Status: DC | PRN

## 2015-11-17 NOTE — Discharge Summary (Signed)
Physician Discharge Summary  YASNA TOROSYAN [id number] DOB: 06-Jan-1951 DOA: 11/16/2015  PCP: Kandice Hams, MD  Admit date: 11/16/2015 Discharge date: 11/17/2015  Time spent: Less than 30 minutes  Recommendations for Outpatient Follow-up:  1. Dr. [name], PCP in one week 2. Dr. Pearson Grippe, Nephrology. Patient to keep up her scheduled hemodialysis appointments on Tuesdays, Thursdays and Saturdays. Informed nephrologist on-call of patient's discharge plans for today and discharge meds (Valtrex dosing confirmed & Medrol Dosepak).  Discharge Diagnoses:  Principal Problem:   Left facial numbness Active Problems:   ESRD (end stage renal disease) (HCC)   GERD (gastroesophageal reflux disease)   HTN (hypertension)   Dizziness   H/O: CVA (cerebrovascular accident)   Congestive heart disease (HCC)   Stroke-like symptom   Vestibular neuritis   Stroke (cerebrum) ([place])   Discharge Condition: Improved & Stable  Diet recommendation: Heart healthy diet.  Filed Weights   11/16/15 1200  Weight: 59.7 kg (131 lb 9.8 oz)    History of present illness & Hospital course:  64 year old female patient with history of ESRD on TTS dialysis via right IJ HD catheter, HTN, HLD, GERD and anemia who presented to [facility] ED on 11/16/15 from dialysis with complaints of left facial and left upper extremity numbness-predominantly of the left face though, which started at approximately 10 AM on 12/3 and dizziness and vertigo for past few days and gait instability. She also reported some mild left year ache. Admitted initially for TIA/stroke evaluation. Neurology consulted. No evidence of sensory abnormality or focal neurological signs. 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 Neurology did not suspect acute stroke. MRI brain was completed which revealed small vessel disease and widespread areas of chronic microhemorrhages consistent with long-standing hypertensive cerebral  vascular disease but no acute intracranial findings. Patient was started on IV Solu-Medrol 125 MG every 6 hours and valacyclovir dosed to renal failure at 500 MG daily. She clinically improved. She was evaluated by neurology and me today. Her left facial and left upper extremity numbness have resolved. No pain reported. Gait instability has improved. Physical therapy evaluated and recommend outpatient PT. Discussed with neurology who recommended completing total 7 days of valacyclovir and Medrol Dosepak. Discussed with nephrologist on call prior to discharge.  She may have degenerated right hip arthritis which likely improved after steroids. If she has any worsening pain as outpatient, may need further workup.  ESRD on TTS HD - Completed hemodialysis on 12/3. Continue outpatient regular dialysis.  Essential hypertension - Uncontrolled and fluctuating. Resume home meds at discharge.   Chronic diastolic CHF - Compensated    Consultations:  Neurology  Procedures:  None    Discharge Exam:  Complaints:  Feels much better. Anxious to go home. Denies left-sided facial numbness or left upper extremity numbness or weakness. No pain reported.  Filed Vitals:   11/17/15 0200 11/17/15 0400 11/17/15 0948 11/17/15 1353  BP: 157/103 133/83 140/80 150/96  Pulse: 77 71 76 67  Temp:  97.9 F (36.6 C) 98.7 F (37.1 C) 98.9 F (37.2 C)  TempSrc:  Oral Oral Oral  Resp: 16 16 16 18   Height:      Weight:      SpO2: 99% 100% 100% 100%    General exam: Pleasant middle-aged female, small built and thinly nourished, seen ambulating comfortably with PT. Respiratory system: Clear. No increased work of breathing. Right IJ HD catheter. Cardiovascular system: S1 & S2 heard, RRR. No JVD, murmurs, gallops, clicks or pedal edema. Telemetry:  Sinus rhythm. Gastrointestinal system: Abdomen is nondistended, soft and nontender. Normal bowel sounds heard. Central nervous system: Alert and oriented. No focal  neurological deficits. Extremities: Symmetric 5 x 5 power. Tortuous failed left forearm AV grafts.  Discharge Instructions      Discharge Instructions    Call MD for:  difficulty breathing, headache or visual disturbances    Complete by:  As directed      Call MD for:  extreme fatigue    Complete by:  As directed      Call MD for:  persistant dizziness or light-headedness    Complete by:  As directed      Call MD for:  persistant nausea and vomiting    Complete by:  As directed      Call MD for:  severe uncontrolled pain    Complete by:  As directed      Call MD for:  temperature >100.4    Complete by:  As directed      Call MD for:    Complete by:  As directed   Strokelike symptoms.     Diet - low sodium heart healthy    Complete by:  As directed      Increase activity slowly    Complete by:  As directed             Medication List    TAKE these medications        allopurinol 300 MG tablet  Commonly known as:  ZYLOPRIM  Take 150 mg by mouth at bedtime.     ALPRAZolam 0.25 MG tablet  Commonly known as:  XANAX  Take 0.25 mg by mouth daily as needed for anxiety.     aspirin 325 MG tablet  Take 1 tablet (325 mg total) by mouth daily.     b complex-vitamin c-folic acid 0.8 MG Tabs tablet  Take 1 tablet by mouth at bedtime.     cinacalcet 30 MG tablet  Commonly known as:  SENSIPAR  Take 30 mg by mouth daily.     doxazosin 8 MG tablet  Commonly known as:  CARDURA  Take 1 tablet (8 mg total) by mouth 2 (two) times daily as needed (hypertension).     HYDROcodone-acetaminophen 5-325 MG tablet  Commonly known as:  NORCO  Take 1 tablet by mouth every 6 (six) hours as needed for moderate pain.     labetalol 300 MG tablet  Commonly known as:  NORMODYNE  Take 300 mg by mouth 2 (two) times daily.     losartan 100 MG tablet  Commonly known as:  COZAAR  Take 100 mg by mouth at bedtime.     methylPREDNISolone 4 MG Tbpk tablet  Commonly known as:  MEDROL DOSEPAK   Take as per instructions.     simvastatin 10 MG tablet  Commonly known as:  ZOCOR  Take 10 mg by mouth at bedtime.     sorbitol 70 % solution  Take 60 mLs by mouth daily as needed (constipation).     traMADol 50 MG tablet  Commonly known as:  ULTRAM  Take 50 mg by mouth 2 (two) times daily.     TUMS 500 MG chewable tablet  Generic drug:  calcium carbonate  Chew 500 mg by mouth 3 (three) times daily.     UNABLE TO FIND  Outpatient physical therapy for balance rehabilitation. Diagnosis: vestibular neuritis.     valACYclovir 500 MG tablet  Commonly known as:  VALTREX  Take  1 tablet (500 mg total) by mouth daily. Take for 6 days beginning 11/18/15.  Start taking on:  11/18/2015     zolpidem 10 MG tablet  Commonly known as:  AMBIEN  Take 10 mg by mouth at bedtime.       Follow-up Information    Follow up with POLITE,RONALD D, MD. Schedule an appointment as soon as possible for a visit in 1 week.   Specialty:  Internal Medicine   Contact information:   [address] [place] [address] [phone]       Follow up with Rexene Agent, MD.   Specialty:  Nephrology   Why:  Keep scheduled hemodialysis appointments on Tuesdays, Thursdays and Saturdays.   Contact information:   [place] [address] [phone]       Follow up with [facility] .   Why:  Call to schedule appointment.    Contact information:   [address] [place], [place] [address]  Phone: [phone] Fax: [phone]       The results of significant diagnostics from this hospitalization (including imaging, microbiology, ancillary and laboratory) are listed below for reference.    Significant Diagnostic Studies: Ct Head Wo Contrast  11/16/2015  CLINICAL DATA:  Left-sided numbness and weakness. History of hypertension and TIA. Code stroke. Initial encounter. EXAM: CT HEAD WITHOUT CONTRAST TECHNIQUE: Contiguous axial images were  obtained from the base of the skull through the vertex without intravenous contrast. COMPARISON:  Head CT 05/09/2015.  MRI brain 05/09/2015. FINDINGS: There is no evidence of acute intracranial hemorrhage, mass lesion, brain edema or extra-axial fluid collection. There is patchy low-density in the periventricular white matter with an asymmetric component in the right frontal region which appears unchanged. Small lacunar infarct within the posterior right corona radiata on image number 17 is unchanged. There is a new small low-density lesion in the right thalamus on image 13. No evidence of acute cortical based infarct. Intracranial vascular calcifications are noted. The visualized paranasal sinuses, mastoid air cells and middle ears are clear. The calvarium is intact. IMPRESSION: 1. New age-indeterminate right thalamic stroke, appearing at least subacute in age. 2. Otherwise stable periventricular white matter disease. No CT evidence of acute cortical stroke or hemorrhage. 3. These results were called by telephone at the time of interpretation on 11/16/2015 at 12:02 pm to Dr. Silverio Decamp , who verbally acknowledged these results. Electronically Signed   By: Richardean Sale M.D.   On: 11/16/2015 12:06   Mr Brain Wo Contrast  11/16/2015  CLINICAL DATA:  Left-sided facial numbness. End-stage renal disease. History of hypertension. EXAM: MRI HEAD WITHOUT CONTRAST TECHNIQUE: Multiplanar, multiecho pulse sequences of the brain and surrounding structures were obtained without intravenous contrast. COMPARISON:  CT head earlier today FINDINGS: The patient was unable to remain motionless for the exam. Small or subtle lesions could be overlooked. No evidence for acute stroke, acute hemorrhage, mass lesion, hydrocephalus, or extra-axial fluid. Premature for age cerebral and cerebellar atrophy. Extensive T2 and FLAIR hyperintensities throughout the periventricular and subcortical white matter representing small vessel disease.  Scattered areas of chronic lacunar infarction affect the BILATERAL thalami, BILATERAL basal ganglia, and periventricular white matter. Flow voids are maintained. There are numerous foci of susceptibility on gradient hemorrhage, throughout the cerebral hemispheres, deep nuclei, cerebellum, and brainstem representing foci of microhemorrhage. Constellation of microbleeds and white matter disease is consistent with longstanding hypertensive cerebrovascular disease. Pituitary and cerebellar tonsils unremarkable. Previous cervical fusion with  instrumentation. Adjacent segment disease suspected at C2-C3 with mild stenosis. Extracranial soft tissues show mild chronic sinus disease but no acute findings. The areas questioned on CT earlier today represent chronic lacunar which have developed since the prior CT and MR 05/09/2015. IMPRESSION: Atrophy, small vessel disease, and widespread areas of chronic microhemorrhage consistent with longstanding hypertensive cerebral vascular disease. No acute intracranial findings. Specifically no acute RIGHT hemisphere stroke. Electronically Signed   By: Staci Righter M.D.   On: 11/16/2015 15:27   Dg Chest Port 1 View  11/16/2015  CLINICAL DATA:  CVA EXAM: PORTABLE CHEST 1 VIEW COMPARISON:  01/22/2015 chest radiograph. FINDINGS: Right internal jugular central venous catheter terminates in the middle third of the superior vena cava. Stable cardiomediastinal silhouette with mild cardiomegaly. No pneumothorax. No pleural effusion. There are nonspecific mild dense reticular opacities in the peripheral upper and basilar right lung, unchanged since 04/01/2013 chest CT angiogram, with an appearance most suggestive of tiny parenchymal calcifications such as from prior barium aspiration or granulomatous infection. No pulmonary edema. No new focal lung opacity. IMPRESSION: 1. Right internal jugular central venous catheter terminates in the middle third of the SVC. No pneumothorax. 2. Stable mild  cardiomegaly without overt pulmonary edema. Electronically Signed   By: Ilona Sorrel M.D.   On: 11/16/2015 19:53    Microbiology: No results found for this or any previous visit (from the past 240 hour(s)).   Labs: Basic Metabolic Panel:  Recent Labs Lab 11/16/15 1150 11/16/15 1200 11/17/15 0236  NA 134* 137 133*  K 3.5 3.4* 4.6  CL 93* 94* 94*  CO2 27  --  24  GLUCOSE 85 88 160*  BUN 9 11 28*  CREATININE 2.54* 2.50* 4.40*  CALCIUM 8.4*  --  9.8   Liver Function Tests:  Recent Labs Lab 11/16/15 1150  AST 23  ALT 8*  ALKPHOS 94  BILITOT 0.5  PROT 7.4  ALBUMIN 3.4*   No results for input(s): LIPASE, AMYLASE in the last 168 hours. No results for input(s): AMMONIA in the last 168 hours. CBC:  Recent Labs Lab 11/16/15 1150 11/16/15 1200  WBC 4.3  --   NEUTROABS 2.2  --   HGB 12.2 14.3  HCT 38.3 42.0  MCV 90.8  --   PLT 216  --    Cardiac Enzymes: No results for input(s): CKTOTAL, CKMB, CKMBINDEX, TROPONINI in the last 168 hours. BNP: BNP (last 3 results)  Recent Labs  01/22/15 0057  BNP 1438.6*    ProBNP (last 3 results) No results for input(s): PROBNP in the last 8760 hours.  CBG: No results for input(s): GLUCAP in the last 168 hours.      Signed:  Vernell Leep, MD, FACP, FHM. [facility] Pager [phone]  If 7PM-7AM, please contact night-coverage [email or web address] Password [facility] 11/17/2015, 5:39 PM

## 2015-11-17 NOTE — Evaluation (Signed)
Physical Therapy Evaluation Patient Details Name: [name] MRN: [id number] DOB: 19-Oct-1951 Today's Date: 11/17/2015   History of Present Illness    64 y.o. female with end-stage renal failure, hemodialysis 3 days a week. While she was in the hemodialysis today at about 10 AM, she noticed some paresthesias involving her left face and left arm. Predominantly on the left face though. As she reported this to her nurse, she was sent to the ER for further evaluation and to rule out a stroke. Patient denies any speech problems, no vision issues no focal motor weakness in upper or lower extremities.  She reported having dizziness and vertigo symptoms for the past few days and some gait instability. Also reported having some mild left ear pain.  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  Balance and gait assessment reveals no overt impairments and standardized testing reveals borderline performance where pt is not statistically at fall risk but with any functional change could be.  Pt educated on simple strategies to improve balance function, and encouraged to join [employer] for ongoing physical activity.  Currently does not require postacute PT services, though pt counseled regarding relative fall risk and strongly advised to seek MD and OPPT should she experience any changes or regression.  Pt verbalized understanding.  No further acute PT needs, pt due to d/c this date.    Follow Up Recommendations  (could benefit from prophylatic OPPT for balance rehab)    Equipment Recommendations   (may benefit from cane in future if balance deteriorates)    Recommendations for Other Services       Precautions / Restrictions Precautions Precautions: Fall Precaution Comments:  supervision in hosptial      Mobility  Bed Mobility Overal bed mobility: Independent                Transfers Overall transfer level: Independent Equipment used: None                Ambulation/Gait Ambulation/Gait assistance: Supervision Ambulation Distance (Feet): 200 Feet Assistive device: None Gait Pattern/deviations: Step-through pattern Gait velocity: 2.22 ft/sec Gait velocity interpretation: Below normal speed for age/gender General Gait Details: no obvious deviations, though pt tends to stroll unless asked to incr speed; tends to drift toward direction she's looking (see DGI)  Stairs            Wheelchair Mobility    Modified Rankin (Stroke Patients Only) Modified Rankin (Stroke Patients Only) Pre-Morbid Rankin Score: No symptoms Modified Rankin: No significant disability     Balance Overall balance assessment: Needs assistance Sitting-balance support: No upper extremity supported;Feet supported Sitting balance-Leahy Scale: Good     Standing balance support: No upper extremity supported Standing balance-Leahy Scale: Fair   Single Leg Stance - Right Leg: 0 Single Leg Stance - Left Leg: 5     Rhomberg - Eyes Opened: 30 Rhomberg - Eyes Closed: 25 (incr sway all directions with no LOB) High level balance activites: Head turns High Level Balance Comments: see DGI, mild drift with limited sustaining head turn Standardized Balance Assessment Standardized Balance Assessment : Dynamic Gait Index   Dynamic Gait Index Level Surface: Mild Impairment Change in Gait Speed: Normal Gait with Horizontal Head Turns: Mild Impairment Gait with Vertical Head Turns: Mild Impairment Gait and Pivot Turn: Normal Step Over  Obstacle: Normal Step Around Obstacles: Normal Steps: Mild Impairment Total Score: 20       Pertinent Vitals/Pain Pain Assessment: No/denies pain    Home Living Family/patient expects to be discharged to:: Private  residence Living Arrangements: Alone Available Help at Discharge: Friend(s);Family;Available PRN/intermittently Type of Home: House Home Access: Stairs to enter Entrance Stairs-Rails: Right;Left Entrance Stairs-Number of Steps: 2 Home Layout: Two level;Able to live on main level with bedroom/bathroom (has borders on second story) Home Equipment: None Additional Comments: closed daycare in June 2016, has new borders renting upstairs    Prior Function Level of Independence: Independent               Hand Dominance   Dominant Hand: Right    Extremity/Trunk Assessment   Upper Extremity Assessment: Overall WFL for tasks assessed           Lower Extremity Assessment: Overall WFL for tasks assessed      Cervical / Trunk Assessment: Normal  Communication   Communication: No difficulties  Cognition Arousal/Alertness: Awake/alert Behavior During Therapy: WFL for tasks assessed/performed Overall Cognitive Status: Within Functional Limits for tasks assessed (previous admit cites possible comprehension impairment)                      General Comments      Exercises Other Exercises Other Exercises: seated ankle circles and pumps (5-10 per hour, throughout day and before OOB) Other Exercises: standing supported single leg stance using sturdy countertop to improve ankle strength and proprioception and dual tasking (10 sec bouts during grooming or kitchen ADLs ) Other Exercises: heel raises supported a countertop for incr strength and ROM (10-15 rep every few hours) Other Exercises: achilles stretch standing at sturdy countertop (hold 10-30 seconds)      Assessment/Plan    PT Assessment Patent does not need any further PT services (could benefit from OPPT as precaution but not essential yet)  PT Diagnosis Difficulty walking   PT Problem List    PT Treatment Interventions     PT Goals (Current goals can be found in the Care Plan section) Acute Rehab PT  Goals Patient Stated Goal: return home, be more active PT Goal Formulation: All assessment and education complete, DC therapy    Frequency     Barriers to discharge        Co-evaluation               End of Session Equipment Utilized During Treatment: Gait belt Activity Tolerance: Patient tolerated treatment well Patient left: in bed;with call bell/phone within reach Nurse Communication: Mobility status;Precautions         Time: [id number] PT Time Calculation (min) (ACUTE ONLY): 32 min   Charges:   PT Evaluation $Initial PT Evaluation Tier I: 1 Procedure PT Treatments $Therapeutic Exercise: 8-22 mins   PT G Codes:        Herbie Drape 11/17/2015, 11:24 AM

## 2015-11-17 NOTE — Progress Notes (Signed)
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  Started on iv solumedrol 125 mg Q6H yesterday, with valtrex 500 mg Q24hrs.   He gait instability has improved. Dizziness, left face paraesthesias and rt hip pain resolved.  No new neuro sx. Pt very happy with response to rx.   MRI brain done, no acute stroke. 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    Current facility-administered medications:  .  allopurinol (ZYLOPRIM) tablet 150 mg, 150 mg, Oral, QHS, Marianne L York, PA-C, 150 mg at 11/16/15 2134 .  ALPRAZolam Duanne Moron) tablet 0.25 mg, 0.25 mg, Oral, Daily PRN, Melton Alar, PA-C, 0.25 mg at 11/17/15 0002 .  aspirin EC tablet 325 mg, 325 mg, Oral, Daily, Annakate Soulier Fuller Mandril, MD .  aspirin suppository 300 mg, 300 mg, Rectal, Daily **OR** aspirin tablet 325 mg, 325 mg, Oral, Daily, Melton Alar, PA-C, 325 mg at 11/17/15 1000 .  aspirin tablet 325 mg, 325 mg, Oral, Daily, Melton Alar, PA-C, 0 mg at 11/16/15 1615 .  calcium carbonate (TUMS - dosed in mg elemental calcium) chewable tablet 200 mg of elemental calcium, 200 mg of elemental calcium, Oral, TID PRN, Melton Alar, PA-C .  calcium carbonate (TUMS - dosed in mg elemental calcium) chewable tablet 500 mg, 500 mg, Oral, TID, Melton Alar, PA-C, 500 mg at 11/17/15 0947 .  cinacalcet (SENSIPAR) tablet 30 mg, 30 mg, Oral, Q breakfast, Melton Alar, PA-C, 30 mg at 11/17/15 0900 .  heparin injection 5,000 Units, 5,000 Units, Subcutaneous, 3 times per day, Melton Alar, PA-C, 5,000 Units at 11/17/15 0701 .  hydrALAZINE (APRESOLINE) injection 10 mg, 10 mg, Intravenous, Q6H PRN, Melton Alar, PA-C .  HYDROcodone-acetaminophen (NORCO/VICODIN) 5-325 MG per tablet 1 tablet, 1 tablet, Oral, Q4H PRN, Bobby Rumpf York, PA-C .  labetalol (NORMODYNE) tablet 300 mg, 300 mg, Oral, BID, Melton Alar, PA-C, 300 mg at 11/17/15  0947 .  methylPREDNISolone (MEDROL DOSEPAK) tablet 4 mg, 4 mg, Oral, PC lunch, Makynzie Dobesh Fuller Mandril, MD .  methylPREDNISolone (MEDROL DOSEPAK) tablet 4 mg, 4 mg, Oral, PC supper, Clancey Welton Fuller Mandril, MD .  Derrill Memo ON 11/18/2015] methylPREDNISolone (MEDROL DOSEPAK) tablet 4 mg, 4 mg, Oral, 3 x daily with food, Lagina Reader Fuller Mandril, MD .  Derrill Memo ON 11/19/2015] methylPREDNISolone (MEDROL DOSEPAK) tablet 4 mg, 4 mg, Oral, 4X daily taper, Jacobo Moncrief Fuller Mandril, MD .  methylPREDNISolone (MEDROL DOSEPAK) tablet 8 mg, 8 mg, Oral, AC breakfast, Nathifa Ritthaler Fuller Mandril, MD .  methylPREDNISolone (MEDROL DOSEPAK) tablet 8 mg, 8 mg, Oral, Nightly, Waylynn Benefiel Fuller Mandril, MD .  Derrill Memo ON 11/18/2015] methylPREDNISolone (MEDROL DOSEPAK) tablet 8 mg, 8 mg, Oral, Nightly, Gargi Berch Fuller Mandril, MD .  multivitamin (RENA-VIT) tablet 1 tablet, 1 tablet, Oral, QHS, Melton Alar, PA-C, 1 tablet at 11/16/15 2137 .  promethazine (PHENERGAN) tablet 25 mg, 25 mg, Oral, Q6H PRN, Melton Alar, PA-C .  senna-docusate (Senokot-S) tablet 1 tablet, 1 tablet, Oral, BID, Melton Alar, PA-C, 1 tablet at 11/16/15 2137 .  simvastatin (ZOCOR) tablet 10 mg, 10 mg, Oral, QHS, Marianne L York, PA-C, 10 mg at 11/16/15 2137 .  sorbitol 70 % solution 60 mL, 60 mL, Oral, Daily PRN, Melton Alar, PA-C .  traMADol (ULTRAM) tablet 50 mg, 50 mg, Oral, Q6H PRN, Bobby Rumpf York, PA-C .  valACYclovir (VALTREX) tablet 500 mg, 500 mg,  Oral, Daily, Mischelle Reeg Fuller Mandril, MD, 500 mg at 11/17/15 [id number] .  zolpidem (AMBIEN) tablet 5 mg, 5 mg, Oral, QHS, Melton Alar, PA-C, 5 mg at 11/16/15 2138   Exam: Filed Vitals:   11/17/15 0200 11/17/15 0400  BP: 157/103 133/83  Pulse: 77 71  Temp:  97.9 F (36.6 C)  Resp: 16 16   A, Ox 3, normal speech. CN 2-12 intact. No nystagmus.  Full motor strength.  Mild gait instability, appears to lean to right , likely contributed by rt hip degenerative  arthritis with pain. But significantly improved gait compared to yesterday.     Impression:  Pt with improved neuro sx, resolved left face paraesthesias, and rt hip pain. Improved gait instability. Will be seen by PT prior to d/c home today, for gait eval, may need out pt PT. Advised to use cane for falls prevention.   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 If pain worsens, she needs f/u with PCP and further eval with hip x rays and ortho referral as out patient.     Can d/c home on medrol dose pack for 6 days, and valtrex 500 mg Q24 hrs x 6 days.   D/ W Dr. Algis Liming.

## 2015-11-17 NOTE — Care Management Note (Signed)
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   Action/Plan: Cm spoke to patient about order for outpatient PT and patient was agreeable. Cm placed information on AVS and explained to patient to call tomorrow to arrange appointment. Cm faxed information and orders over to [facility] for Outpatient PT. Patient said that she has no further needs at this time. Patient states that she has enough support at home and the only thing she needs is a cane. CM called [facility] for cane and it is to be delivered to the bedside.    Expected Discharge Date:  11/17/15               Expected Discharge Plan:  Home/Self Care  In-House Referral:     Discharge planning Services  CM Consult  Post Acute Care Choice:  Durable Medical Equipment Choice offered to:  Patient  DME Arranged:  Kasandra Knudsen DME Agency:     [facility] Arranged:    [place] Agency:     Status of Service:  Completed, signed off  Medicare Important Message Given:    Date Medicare IM Given:    Medicare IM give by:    Date Additional Medicare IM Given:    Additional Medicare Important Message give by:     If discussed at [place] of Stay Meetings, dates discussed:    Additional Comments:  Guido Sander, RN 11/17/2015, 7:06 PM

## 2015-11-18 LAB — HEMOGLOBIN A1C
HEMOGLOBIN A1C: 5 % (ref 4.8–5.6)
Mean Plasma Glucose: 97 mg/dL

## 2015-11-19 ENCOUNTER — Other Ambulatory Visit: Payer: Self-pay

## 2015-11-19 ENCOUNTER — Encounter (HOSPITAL_COMMUNITY): Payer: Self-pay

## 2015-11-19 ENCOUNTER — Emergency Department (HOSPITAL_COMMUNITY): Payer: Medicare Other

## 2015-11-19 ENCOUNTER — Observation Stay (HOSPITAL_COMMUNITY)
Admission: EM | Admit: 2015-11-19 | Discharge: 2015-11-20 | Disposition: A | Discharge status: Home / Self Care | Payer: Medicare Other | Attending: Internal Medicine | Admitting: Internal Medicine

## 2015-11-19 DIAGNOSIS — E875 Hyperkalemia: Secondary | ICD-10-CM | POA: Diagnosis not present

## 2015-11-19 DIAGNOSIS — F419 Anxiety disorder, unspecified: Secondary | ICD-10-CM | POA: Diagnosis not present

## 2015-11-19 DIAGNOSIS — R269 Unspecified abnormalities of gait and mobility: Secondary | ICD-10-CM | POA: Diagnosis not present

## 2015-11-19 DIAGNOSIS — R29818 Other symptoms and signs involving the nervous system: Secondary | ICD-10-CM | POA: Diagnosis present

## 2015-11-19 DIAGNOSIS — Z992 Dependence on renal dialysis: Secondary | ICD-10-CM | POA: Insufficient documentation

## 2015-11-19 DIAGNOSIS — Z7982 Long term (current) use of aspirin: Secondary | ICD-10-CM | POA: Insufficient documentation

## 2015-11-19 DIAGNOSIS — K219 Gastro-esophageal reflux disease without esophagitis: Secondary | ICD-10-CM | POA: Insufficient documentation

## 2015-11-19 DIAGNOSIS — M109 Gout, unspecified: Secondary | ICD-10-CM | POA: Insufficient documentation

## 2015-11-19 DIAGNOSIS — R4781 Slurred speech: Secondary | ICD-10-CM | POA: Diagnosis not present

## 2015-11-19 DIAGNOSIS — G4700 Insomnia, unspecified: Secondary | ICD-10-CM

## 2015-11-19 DIAGNOSIS — G629 Polyneuropathy, unspecified: Secondary | ICD-10-CM | POA: Diagnosis not present

## 2015-11-19 DIAGNOSIS — I120 Hypertensive chronic kidney disease with stage 5 chronic kidney disease or end stage renal disease: Secondary | ICD-10-CM | POA: Diagnosis not present

## 2015-11-19 DIAGNOSIS — M1990 Unspecified osteoarthritis, unspecified site: Secondary | ICD-10-CM | POA: Insufficient documentation

## 2015-11-19 DIAGNOSIS — I132 Hypertensive heart and chronic kidney disease with heart failure and with stage 5 chronic kidney disease, or end stage renal disease: Principal | ICD-10-CM | POA: Insufficient documentation

## 2015-11-19 DIAGNOSIS — Z79899 Other long term (current) drug therapy: Secondary | ICD-10-CM | POA: Diagnosis not present

## 2015-11-19 DIAGNOSIS — E785 Hyperlipidemia, unspecified: Secondary | ICD-10-CM | POA: Diagnosis present

## 2015-11-19 DIAGNOSIS — N186 End stage renal disease: Secondary | ICD-10-CM | POA: Diagnosis present

## 2015-11-19 DIAGNOSIS — I6789 Other cerebrovascular disease: Secondary | ICD-10-CM | POA: Diagnosis not present

## 2015-11-19 DIAGNOSIS — D631 Anemia in chronic kidney disease: Secondary | ICD-10-CM | POA: Diagnosis not present

## 2015-11-19 DIAGNOSIS — R2681 Unsteadiness on feet: Secondary | ICD-10-CM | POA: Insufficient documentation

## 2015-11-19 DIAGNOSIS — R4182 Altered mental status, unspecified: Secondary | ICD-10-CM

## 2015-11-19 DIAGNOSIS — Z940 Kidney transplant status: Secondary | ICD-10-CM | POA: Diagnosis not present

## 2015-11-19 DIAGNOSIS — R479 Unspecified speech disturbances: Secondary | ICD-10-CM

## 2015-11-19 DIAGNOSIS — I10 Essential (primary) hypertension: Secondary | ICD-10-CM | POA: Diagnosis present

## 2015-11-19 DIAGNOSIS — R2990 Unspecified symptoms and signs involving the nervous system: Secondary | ICD-10-CM | POA: Diagnosis present

## 2015-11-19 DIAGNOSIS — Z87891 Personal history of nicotine dependence: Secondary | ICD-10-CM | POA: Insufficient documentation

## 2015-11-19 DIAGNOSIS — I509 Heart failure, unspecified: Secondary | ICD-10-CM | POA: Diagnosis not present

## 2015-11-19 DIAGNOSIS — N2581 Secondary hyperparathyroidism of renal origin: Secondary | ICD-10-CM | POA: Diagnosis not present

## 2015-11-19 LAB — CBC WITH DIFFERENTIAL/PLATELET
BASOS ABS: 0 10*3/uL (ref 0.0–0.1)
Basophils Relative: 0 %
EOS PCT: 1 %
Eosinophils Absolute: 0.1 10*3/uL (ref 0.0–0.7)
HEMATOCRIT: 35.3 % — AB (ref 36.0–46.0)
Hemoglobin: 11.1 g/dL — ABNORMAL LOW (ref 12.0–15.0)
LYMPHS ABS: 2.2 10*3/uL (ref 0.7–4.0)
LYMPHS PCT: 30 %
MCH: 28.2 pg (ref 26.0–34.0)
MCHC: 31.4 g/dL (ref 30.0–36.0)
MCV: 89.6 fL (ref 78.0–100.0)
MONO ABS: 0.5 10*3/uL (ref 0.1–1.0)
Monocytes Relative: 6 %
NEUTROS ABS: 4.7 10*3/uL (ref 1.7–7.7)
Neutrophils Relative %: 63 %
PLATELETS: 209 10*3/uL (ref 150–400)
RBC: 3.94 MIL/uL (ref 3.87–5.11)
RDW: 17.5 % — AB (ref 11.5–15.5)
WBC: 7.5 10*3/uL (ref 4.0–10.5)

## 2015-11-19 LAB — RENAL FUNCTION PANEL
ALBUMIN: 3.1 g/dL — AB (ref 3.5–5.0)
ANION GAP: 20 — AB (ref 5–15)
BUN: 98 mg/dL — AB (ref 6–20)
CALCIUM: 8.7 mg/dL — AB (ref 8.9–10.3)
CO2: 22 mmol/L (ref 22–32)
Chloride: 91 mmol/L — ABNORMAL LOW (ref 101–111)
Creatinine, Ser: 9.86 mg/dL — ABNORMAL HIGH (ref 0.44–1.00)
GFR calc Af Amer: 4 mL/min — ABNORMAL LOW (ref >60)
GFR calc non Af Amer: 4 mL/min — ABNORMAL LOW (ref >60)
GLUCOSE: 88 mg/dL (ref 65–99)
PHOSPHORUS: 6.3 mg/dL — AB (ref 2.5–4.6)
Potassium: 4.7 mmol/L (ref 3.5–5.1)
SODIUM: 133 mmol/L — AB (ref 135–145)

## 2015-11-19 LAB — COMPREHENSIVE METABOLIC PANEL
ALBUMIN: 3.2 g/dL — AB (ref 3.5–5.0)
ALT: 11 U/L — AB (ref 14–54)
AST: 23 U/L (ref 15–41)
Alkaline Phosphatase: 82 U/L (ref 38–126)
Anion gap: 19 — ABNORMAL HIGH (ref 5–15)
BUN: 90 mg/dL — AB (ref 6–20)
CHLORIDE: 91 mmol/L — AB (ref 101–111)
CO2: 23 mmol/L (ref 22–32)
CREATININE: 9.24 mg/dL — AB (ref 0.44–1.00)
Calcium: 8.7 mg/dL — ABNORMAL LOW (ref 8.9–10.3)
GFR calc Af Amer: 5 mL/min — ABNORMAL LOW (ref >60)
GFR calc non Af Amer: 4 mL/min — ABNORMAL LOW (ref >60)
Glucose, Bld: 82 mg/dL (ref 65–99)
POTASSIUM: 5.7 mmol/L — AB (ref 3.5–5.1)
SODIUM: 133 mmol/L — AB (ref 135–145)
Total Bilirubin: 0.4 mg/dL (ref 0.3–1.2)
Total Protein: 6.7 g/dL (ref 6.5–8.1)

## 2015-11-19 LAB — PROTIME-INR
INR: 1.13 (ref 0.00–1.49)
Prothrombin Time: 14.7 seconds (ref 11.6–15.2)

## 2015-11-19 LAB — TSH: TSH: 3.565 u[IU]/mL (ref 0.350–4.500)

## 2015-11-19 LAB — AMMONIA: AMMONIA: 31 umol/L (ref 9–35)

## 2015-11-19 LAB — SEDIMENTATION RATE: SED RATE: 26 mm/h — AB (ref 0–22)

## 2015-11-19 LAB — TROPONIN I: Troponin I: 0.03 ng/mL (ref <0.031)

## 2015-11-19 LAB — I-STAT CG4 LACTIC ACID, ED: LACTIC ACID, VENOUS: 0.82 mmol/L (ref 0.5–2.0)

## 2015-11-19 MED ORDER — LABETALOL HCL 5 MG/ML IV SOLN
20.0000 mg | Freq: Once | INTRAVENOUS | Status: AC
  Administered 2015-11-19: 20 mg via INTRAVENOUS
  Filled 2015-11-19: qty 4

## 2015-11-19 MED ORDER — HYDROCODONE-ACETAMINOPHEN 5-325 MG PO TABS: 1.0000 | ORAL_TABLET | Freq: Four times a day (QID) | ORAL | Status: DC | PRN

## 2015-11-19 MED ORDER — SODIUM CHLORIDE 0.9 % IJ SOLN
3.0000 mL | Freq: Two times a day (BID) | INTRAMUSCULAR | Status: DC
  Administered 2015-11-19 – 2015-11-20 (×2): 3 mL via INTRAVENOUS

## 2015-11-19 MED ORDER — PROMETHAZINE HCL 25 MG PO TABS: 25.0000 mg | ORAL_TABLET | Freq: Four times a day (QID) | ORAL | Status: DC | PRN

## 2015-11-19 MED ORDER — ALLOPURINOL 300 MG PO TABS
150.0000 mg | ORAL_TABLET | Freq: Every day | ORAL | Status: DC
  Administered 2015-11-20: 150 mg via ORAL
  Filled 2015-11-19: qty 1

## 2015-11-19 MED ORDER — RENA-VITE PO TABS
1.0000 | ORAL_TABLET | Freq: Every day | ORAL | Status: DC
  Filled 2015-11-19: qty 1

## 2015-11-19 MED ORDER — ZOLPIDEM TARTRATE 5 MG PO TABS: 10.0000 mg | ORAL_TABLET | Freq: Every day | ORAL | Status: DC

## 2015-11-19 MED ORDER — HYDRALAZINE HCL 20 MG/ML IJ SOLN: 5.0000 mg | INTRAMUSCULAR | Status: DC | PRN

## 2015-11-19 MED ORDER — LOSARTAN POTASSIUM 50 MG PO TABS
100.0000 mg | ORAL_TABLET | Freq: Every day | ORAL | Status: DC
  Administered 2015-11-19 – 2015-11-20 (×2): 100 mg via ORAL
  Filled 2015-11-19 (×2): qty 2

## 2015-11-19 MED ORDER — SODIUM POLYSTYRENE SULFONATE 15 GM/60ML PO SUSP
30.0000 g | Freq: Once | ORAL | Status: AC
  Administered 2015-11-19: 30 g via ORAL
  Filled 2015-11-19: qty 120

## 2015-11-19 MED ORDER — SORBITOL 70 % PO SOLN: 60.0000 mL | Freq: Every day | ORAL | Status: DC | PRN

## 2015-11-19 MED ORDER — CINACALCET HCL 30 MG PO TABS
30.0000 mg | ORAL_TABLET | Freq: Every day | ORAL | Status: DC
  Administered 2015-11-20: 30 mg via ORAL
  Filled 2015-11-19 (×2): qty 1

## 2015-11-19 MED ORDER — CALCIUM CARBONATE ANTACID 500 MG PO CHEW
500.0000 mg | CHEWABLE_TABLET | Freq: Three times a day (TID) | ORAL | Status: DC
  Administered 2015-11-20 (×2): 500 mg via ORAL
  Filled 2015-11-19: qty 3
  Filled 2015-11-19: qty 1
  Filled 2015-11-19: qty 3
  Filled 2015-11-19: qty 1

## 2015-11-19 MED ORDER — ONDANSETRON HCL 4 MG/2ML IJ SOLN: 4.0000 mg | Freq: Four times a day (QID) | INTRAMUSCULAR | Status: DC | PRN

## 2015-11-19 MED ORDER — DOXAZOSIN MESYLATE 8 MG PO TABS
8.0000 mg | ORAL_TABLET | Freq: Two times a day (BID) | ORAL | Status: DC | PRN
  Filled 2015-11-19: qty 1

## 2015-11-19 MED ORDER — STROKE: EARLY STAGES OF RECOVERY BOOK
Freq: Once | Status: AC
  Administered 2015-11-19: 18:00:00
  Filled 2015-11-19: qty 1

## 2015-11-19 MED ORDER — HEPARIN SODIUM (PORCINE) 5000 UNIT/ML IJ SOLN
5000.0000 [IU] | Freq: Three times a day (TID) | INTRAMUSCULAR | Status: DC
  Administered 2015-11-20 (×2): 5000 [IU] via SUBCUTANEOUS
  Filled 2015-11-19 (×2): qty 1

## 2015-11-19 MED ORDER — ALPRAZOLAM 0.25 MG PO TABS
0.2500 mg | ORAL_TABLET | Freq: Two times a day (BID) | ORAL | Status: DC
  Administered 2015-11-19 – 2015-11-20 (×3): 0.25 mg via ORAL
  Filled 2015-11-19 (×3): qty 1

## 2015-11-19 MED ORDER — SIMVASTATIN 10 MG PO TABS
10.0000 mg | ORAL_TABLET | Freq: Every day | ORAL | Status: DC
  Administered 2015-11-20: 10 mg via ORAL
  Filled 2015-11-19 (×2): qty 1

## 2015-11-19 MED ORDER — ASPIRIN 81 MG PO CHEW
81.0000 mg | CHEWABLE_TABLET | Freq: Every day | ORAL | Status: DC
  Administered 2015-11-19 – 2015-11-20 (×2): 81 mg via ORAL
  Filled 2015-11-19 (×2): qty 1

## 2015-11-19 MED ORDER — ZOLPIDEM TARTRATE 5 MG PO TABS
5.0000 mg | ORAL_TABLET | Freq: Every day | ORAL | Status: DC
  Administered 2015-11-20: 5 mg via ORAL
  Filled 2015-11-19: qty 1

## 2015-11-19 MED ORDER — SODIUM CHLORIDE 0.9 % IV SOLN: 250.0000 mL | INTRAVENOUS | Status: DC | PRN

## 2015-11-19 MED ORDER — ONDANSETRON HCL 4 MG PO TABS
4.0000 mg | ORAL_TABLET | Freq: Four times a day (QID) | ORAL | Status: DC | PRN
Start: 2015-11-19 — End: 2015-11-20

## 2015-11-19 MED ORDER — SODIUM CHLORIDE 0.9 % IJ SOLN: 3.0000 mL | INTRAMUSCULAR | Status: DC | PRN

## 2015-11-19 NOTE — ED Provider Notes (Signed)
CSN: [id number]     Arrival date & time 11/19/15  [id number] History   First MD Initiated Contact with Patient 11/19/15 [id number]     Chief Complaint  Patient presents with  . Stroke Symptoms     (Consider location/radiation/quality/duration/timing/severity/associated sxs/prior Treatment) HPI A she was discharged from the hospital 2 days ago after initially presenting as a code stroke. At time she had facial numbness. Neurology evaluation identified multiple old ischemic areas consistent with chronic hypertensive disease. No new CVA was identified. Lesions were identified in auditory canal and patient was diagnosed with shingles. She was discharged on prednisone and Valtrex. Patient states she felt improved at the time of her discharge. She reports yesterday she just didn't feel well. She has difficulty quantifying and what respect. She reports there was no pain associated. She denies headache or chest pain. She describes a vague sensation of malaise and nausea. Spoken with her sister yesterday evening some time, per report her sister found her to be normal at that time. This morning her speech was noted to be slurred. The patient lives alone. There is no specific time of onset of this symptom. Patient denies she's had fever that she is aware of. She has not developed any vomiting or diarrhea. She is a dialysis patient. At this time she indicates her last dialysis was on Saturday. She reports she was due to go to dialysis today but did not feel well and was unable to go. Her gait was noted by a caregiver taking her to dialysis to be more unsteady than usual and her speech to be garbled. Past Medical History  Diagnosis Date  . Anxiety   . Blood transfusion   . Depression   . GERD (gastroesophageal reflux disease)   . Hyperlipidemia   . Hypertension   . Neuromuscular disorder (HCC)     neuropathy hand and legs  . Weight loss, unintentional   . Adenomatous polyp of colon 10/2010  . Diverticula, colon   .  CHF (congestive heart failure) ([place])   . 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  Had deceased donor kidney transplant in 1996.  Had some early rejection then stable function for years, then had slow decline of function and went back on hemodialysis in 2012.  Gets HD TTS schedule at [facility] still using L forearm AVF.     Marland Kitchen Anemia in CKD (chronic kidney disease) 11/07/2012  . Arthritis   . Osteoporosis    Past Surgical History  Procedure Laterality Date  . Kidney transplant  1996  . Back surgery    . Cervical fusion    . Av fistula placement      for dialysis  . Cholecystectomy  12/02/2012    Procedure: LAPAROSCOPIC CHOLECYSTECTOMY WITH INTRAOPERATIVE CHOLANGIOGRAM;  Surgeon: Edward Jolly, MD;  Location: [facility] OR;  Service: General;  Laterality: N/A;   Family History  Problem Relation Age of Onset  . Colon cancer Brother   . Cancer Brother   . Coronary artery disease Mother 42  . Hyperlipidemia Mother   . Hypertension Mother   . Esophageal cancer Neg Hx   . Stomach cancer Neg Hx   . Rectal cancer Neg Hx    Social History  Substance Use Topics  . Smoking status: Former Smoker    Types: Cigarettes    Quit date: 12/31/1991  . Smokeless tobacco: Never Used  .  Alcohol Use: No   OB History    No data available     Review of Systems 10 Systems reviewed and are negative for acute change except as noted in the HPI.    Allergies  Sulfa antibiotics  Home Medications   Prior to Admission medications   Medication Sig Start Date End Date Taking? Authorizing Provider  acetaminophen (TYLENOL) 325 MG tablet Take 650 mg by mouth every 6 (six) hours as needed for mild pain.   Yes Historical Provider, MD  allopurinol (ZYLOPRIM) 300 MG tablet Take 150 mg by mouth at bedtime.   Yes Historical Provider, MD  ALPRAZolam (XANAX) 0.25 MG tablet Take 0.25 mg by mouth 2 (two) times daily.     Yes Historical Provider, MD  aspirin 81 MG chewable tablet Chew 81 mg by mouth daily.   Yes Historical Provider, MD  b complex-vitamin c-folic acid (NEPHRO-VITE) 0.8 MG TABS tablet Take 1 tablet by mouth at bedtime.   Yes Historical Provider, MD  calcium carbonate (TUMS) 500 MG chewable tablet Chew 500 mg by mouth 3 (three) times daily.   Yes Historical Provider, MD  cinacalcet (SENSIPAR) 30 MG tablet Take 30 mg by mouth daily.   Yes Historical Provider, MD  doxazosin (CARDURA) 8 MG tablet Take 1 tablet (8 mg total) by mouth 2 (two) times daily as needed (hypertension). 11/17/15  Yes Modena Jansky, MD  HYDROcodone-acetaminophen (NORCO) 5-325 MG tablet Take 1 tablet by mouth every 6 (six) hours as needed for moderate pain. 11/17/15  Yes Modena Jansky, MD  labetalol (NORMODYNE) 300 MG tablet Take 300 mg by mouth 2 (two) times daily. 11/09/12  Yes Velvet Bathe, MD  losartan (COZAAR) 100 MG tablet Take 100 mg by mouth at bedtime.    Yes Historical Provider, MD  promethazine (PHENERGAN) 25 MG tablet Take 25 mg by mouth every 6 (six) hours as needed for nausea or vomiting.   Yes Historical Provider, MD  simvastatin (ZOCOR) 10 MG tablet Take 10 mg by mouth at bedtime.    Yes Historical Provider, MD  sorbitol 70 % solution Take 60 mLs by mouth daily as needed (constipation).    Yes Historical Provider, MD  traMADol (ULTRAM) 50 MG tablet Take 50 mg by mouth 2 (two) times daily.  10/25/15  Yes Historical Provider, MD  valACYclovir (VALTREX) 500 MG tablet Take 1 tablet (500 mg total) by mouth daily. Take for 6 days beginning 11/18/15. 11/18/15  Yes Modena Jansky, MD  zolpidem (AMBIEN) 10 MG tablet Take 10 mg by mouth at bedtime.   Yes Historical Provider, MD  aspirin 325 MG tablet Take 1 tablet (325 mg total) by mouth daily. Patient not taking: Reported on 11/19/2015 05/10/15   Velvet Bathe, MD  methylPREDNISolone (MEDROL DOSEPAK) 4 MG TBPK tablet Take as per instructions. Patient not taking: Reported  on 11/19/2015 11/17/15   Modena Jansky, MD  UNABLE TO FIND Outpatient physical therapy for balance rehabilitation. Diagnosis: vestibular neuritis. Patient not taking: Reported on 11/19/2015 11/17/15   Modena Jansky, MD   BP 179/100 mmHg  Pulse 72  Temp(Src) 97.8 F (36.6 C) (Oral)  Resp 17  SpO2 100% Physical Exam  Constitutional: She is oriented to person, place, and time. She appears well-developed and well-nourished.  Patient is nontoxic. She has no respiratory distress. She is well-nourished well-developed.  HENT:  Head: Normocephalic and atraumatic.  Nose: Nose normal.  Mouth/Throat: Oropharynx is clear and moist.  Eyes: EOM are normal. Pupils are equal,  round, and reactive to light.  Neck: Neck supple.  Cardiovascular: Normal rate, regular rhythm, normal heart sounds and intact distal pulses.   Pulmonary/Chest: Effort normal and breath sounds normal.  Abdominal: Soft. Bowel sounds are normal. She exhibits no distension. There is no tenderness.  Musculoskeletal: Normal range of motion. She exhibits no edema or tenderness.  Patient has extensive AV fistula-related vascular dilation on the left upper extremity. No areas of erythema or edema. Her extremities have no edema or calf tenderness.  Neurological: She is alert and oriented to person, place, and time. She has normal strength. Coordination normal. GCS eye subscore is 4. GCS verbal subscore is 5. GCS motor subscore is 6.  Patient's speech seems vaguely slurred and thick, but the content is normal. She is oriented to person time of place. Her medical historical content is accurate. Patient follows commands appropriately for bilateral symmetric grip strength. She is also able to perform heel shin on both lower extremities although there seems to be some degree of incoordination doing so. Can elevate and hold each lower extremity off of the bed appropriately.  Skin: Skin is warm, dry and intact.  Psychiatric: She has a normal mood  and affect.    ED Course  Procedures (including critical care time) CRITICAL CARE Performed by: Charlesetta Shanks   Total critical care time: 40 minutes  Critical care time was exclusive of separately billable procedures and treating other patients.  Critical care was necessary to treat or prevent imminent or life-threatening deterioration.  Critical care was time spent personally by me on the following activities: development of treatment plan with patient and/or surrogate as well as nursing, discussions with consultants, evaluation of patient's response to treatment, examination of patient, obtaining history from patient or surrogate, ordering and performing treatments and interventions, ordering and review of laboratory studies, ordering and review of radiographic studies, pulse oximetry and re-evaluation of patient's condition. Labs Review Labs Reviewed  COMPREHENSIVE METABOLIC PANEL - Abnormal; Notable for the following:    Sodium 133 (*)    Potassium 5.7 (*)    Chloride 91 (*)    BUN 90 (*)    Creatinine, Ser 9.24 (*)    Calcium 8.7 (*)    Albumin 3.2 (*)    ALT 11 (*)    GFR calc non Af Amer 4 (*)    GFR calc Af Amer 5 (*)    Anion gap 19 (*)    All other components within normal limits  CBC WITH DIFFERENTIAL/PLATELET - Abnormal; Notable for the following:    Hemoglobin 11.1 (*)    HCT 35.3 (*)    RDW 17.5 (*)    All other components within normal limits  SEDIMENTATION RATE - Abnormal; Notable for the following:    Sed Rate 26 (*)    All other components within normal limits  CULTURE, BLOOD (ROUTINE X 2)  CULTURE, BLOOD (ROUTINE X 2)  TROPONIN I  PROTIME-INR  AMMONIA  TSH  URINALYSIS, ROUTINE W REFLEX MICROSCOPIC (NOT AT [facility])  URINE RAPID DRUG SCREEN, HOSP PERFORMED  I-STAT CG4 LACTIC ACID, ED    Imaging Review No results found. I have personally reviewed and evaluated these images and lab results as part of my medical decision-making.   EKG  Interpretation   Date/Time:  Tuesday November 19 2015 08:42:01 EST Ventricular Rate:  70 PR Interval:  188 QRS Duration: 75 QT Interval:  449 QTC Calculation: 484 R Axis:   69 Text Interpretation:  Sinus rhythm PEAKING LATERAL  T WAVES Confirmed by  Johnney Killian, MD, Jeannie Done [phone]) on 11/19/2015 2:13:46 PM     Consult: Neurology's consult it and patient is evaluated in the emergency department. At this point MRI will be repeated. I will be to discontinue the patient's Valtrex and continue steroids. Consult: [facility] hospitalist for addition. MDM   Final diagnoses:  ESRD (end stage renal disease) on dialysis ([place])  Hyperkalemia  Speech abnormality  Gait instability   A she presents neurologic complaint. At this time patient is mentating clearly but has slurred speech and gait instability. Further diagnostic evaluation is recommended based on neurology assessment. Patient will be admitted for management as well of hyperkalemia with ESRD. Patient missed her dialysis appointment today. She is nontoxic and alert. Hypertension is being managed with labetalol. Patient does not have fever or meningismus to suggest infectious etiology. Other causes of encephalopathy including medication reaction of Valtrex, metabolic, hypertensive are considered.    Charlesetta Shanks, MD 11/19/15 [phone]

## 2015-11-19 NOTE — ED Notes (Signed)
Assisted Brooke, RN with in and out cath on patient; Jerene Pitch, RN was unsuccessful

## 2015-11-19 NOTE — ED Notes (Signed)
Pt transported to MRI 

## 2015-11-19 NOTE — ED Notes (Signed)
Patient undressed, in gown, on monitor, continuous pulse oximetry and blood pressure cuff 

## 2015-11-19 NOTE — Consult Note (Signed)
[facility] Renal Consultation Note  Indication for Consultation:  Management of ESRD/hemodialysis; anemia, hypertension/volume and secondary hyperparathyroidism  HPI: [name] is a 64 y.o. female admitted with  Slurred speech / AMS with Stroke vs Uremia/metabolic derangement, vs medication induced (valtrex and steroids).Noted  She was  discharged on 11/17/15 after stroke workup including MRI that was unrevealing. She has a perm cath (VVS to place new Access 11/22/15)with reported  no sign of infection. She presented to op HD today EASR (TTS) and sent to ER secondary to slurred speech and numbness in bilat hands.     She lives alone and no family in ER currently. She recognizes me from OP HD center /has slurred speech and diarrhea from kayexalate for 5.7K. Plan for HD with no heparin tonight.     Past Medical History  Diagnosis Date  . Anxiety   . Blood transfusion   . Depression   . GERD (gastroesophageal reflux disease)   . Hyperlipidemia   . Hypertension   . Neuromuscular disorder (HCC)     neuropathy hand and legs  . Weight loss, unintentional   . Adenomatous polyp of colon 10/2010  . Diverticula, colon   . CHF (congestive heart failure) ([place])   . 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  Had deceased donor kidney transplant in 1996.  Had some early rejection then stable function for years, then had slow decline of function and went back on hemodialysis in 2012.  Gets HD TTS schedule at [facility] on [facility] still using L forearm AVF.     Marland Kitchen Anemia in CKD (chronic kidney disease) 11/07/2012  . Arthritis   . Osteoporosis     Past Surgical History  Procedure Laterality Date  . Kidney transplant  1996  . Back surgery    . Cervical fusion    . Av fistula placement      for dialysis  . Cholecystectomy  12/02/2012    Procedure: LAPAROSCOPIC CHOLECYSTECTOMY WITH  INTRAOPERATIVE CHOLANGIOGRAM;  Surgeon: Edward Jolly, MD;  Location: [facility] OR;  Service: General;  Laterality: N/A;      Family History  Problem Relation Age of Onset  . Colon cancer Brother   . Cancer Brother   . Coronary artery disease Mother 63  . Hyperlipidemia Mother   . Hypertension Mother   . Esophageal cancer Neg Hx   . Stomach cancer Neg Hx   . Rectal cancer Neg Hx       reports that she quit smoking about 23 years ago. Her smoking use included Cigarettes. She has never used smokeless tobacco. She reports that she does not drink alcohol or use illicit drugs.   Allergies  Allergen Reactions  . Sulfa Antibiotics Other (See Comments)    Both parents allergic-so will not take    Prior to Admission medications   Medication Sig Start Date End Date Taking? Authorizing Provider  acetaminophen (TYLENOL) 325 MG tablet Take 650 mg by mouth every 6 (six) hours as needed for mild pain.   Yes Historical Provider, MD  allopurinol (ZYLOPRIM) 300 MG tablet Take 150 mg by mouth at bedtime.   Yes Historical Provider, MD  ALPRAZolam (XANAX) 0.25 MG tablet Take 0.25 mg by mouth 2 (two) times daily.    Yes Historical Provider, MD  aspirin 81 MG chewable tablet Chew 81 mg by mouth daily.   Yes Historical Provider, MD  b complex-vitamin c-folic acid (NEPHRO-VITE) 0.8 MG TABS tablet Take 1 tablet by mouth at bedtime.   Yes Historical Provider, MD  calcium carbonate (TUMS) 500 MG chewable tablet Chew 500 mg by mouth 3 (three) times daily.   Yes Historical Provider, MD  cinacalcet (SENSIPAR) 30 MG tablet Take 30 mg by mouth daily.   Yes Historical Provider, MD  doxazosin (CARDURA) 8 MG tablet Take 1 tablet (8 mg total) by mouth 2 (two) times daily as needed (hypertension). 11/17/15  Yes Modena Jansky, MD  HYDROcodone-acetaminophen (NORCO) 5-325 MG tablet Take 1 tablet by mouth every 6 (six) hours as needed for moderate pain. 11/17/15  Yes Modena Jansky, MD  labetalol (NORMODYNE) 300 MG  tablet Take 300 mg by mouth 2 (two) times daily. 11/09/12  Yes Velvet Bathe, MD  losartan (COZAAR) 100 MG tablet Take 100 mg by mouth at bedtime.    Yes Historical Provider, MD  promethazine (PHENERGAN) 25 MG tablet Take 25 mg by mouth every 6 (six) hours as needed for nausea or vomiting.   Yes Historical Provider, MD  simvastatin (ZOCOR) 10 MG tablet Take 10 mg by mouth at bedtime.    Yes Historical Provider, MD  sorbitol 70 % solution Take 60 mLs by mouth daily as needed (constipation).    Yes Historical Provider, MD  traMADol (ULTRAM) 50 MG tablet Take 50 mg by mouth 2 (two) times daily.  10/25/15  Yes Historical Provider, MD  zolpidem (AMBIEN) 10 MG tablet Take 10 mg by mouth at bedtime.   Yes Historical Provider, MD  aspirin 325 MG tablet Take 1 tablet (325 mg total) by mouth daily. Patient not taking: Reported on 11/19/2015 05/10/15   Velvet Bathe, MD  UNABLE TO FIND Outpatient physical therapy for balance rehabilitation. Diagnosis: vestibular neuritis. Patient not taking: Reported on 11/19/2015 11/17/15   Modena Jansky, MD     Anti-infectives    None      Results for orders placed or performed during the hospital encounter of 11/19/15 (from the past 48 hour(s))  Comprehensive metabolic panel     Status: Abnormal   Collection Time: 11/19/15  9:26 AM  Result Value Ref Range   Sodium 133 (L) 135 - 145 mmol/L   Potassium 5.7 (H) 3.5 - 5.1 mmol/L   Chloride 91 (L) 101 - 111 mmol/L   CO2 23 22 - 32 mmol/L   Glucose, Bld 82 65 - 99 mg/dL   BUN 90 (H) 6 - 20 mg/dL   Creatinine, Ser 9.24 (H) 0.44 - 1.00 mg/dL   Calcium 8.7 (L) 8.9 - 10.3 mg/dL   Total Protein 6.7 6.5 - 8.1 g/dL   Albumin 3.2 (L) 3.5 - 5.0 g/dL   AST 23 15 - 41 U/L   ALT 11 (L) 14 - 54 U/L   Alkaline Phosphatase 82 38 - 126 U/L   Total Bilirubin 0.4 0.3 - 1.2 mg/dL   GFR calc non Af Amer 4 (L) >60 mL/min   GFR calc Af Amer 5 (L) >60 mL/min    Comment: (NOTE) The eGFR has been calculated using the CKD EPI  equation. This calculation has not been validated in all clinical situations. eGFR's persistently <60 mL/min signify possible Chronic Kidney Disease.    Anion gap 19 (H) 5 - 15  Troponin I     Status: None   Collection Time: 11/19/15  9:26 AM  Result Value Ref Range   Troponin I 0.03 <0.031 ng/mL    Comment:  NO INDICATION OF MYOCARDIAL INJURY.   CBC with Differential     Status: Abnormal   Collection Time: 11/19/15  9:26 AM  Result Value Ref Range   WBC 7.5 4.0 - 10.5 K/uL   RBC 3.94 3.87 - 5.11 MIL/uL   Hemoglobin 11.1 (L) 12.0 - 15.0 g/dL   HCT 35.3 (L) 36.0 - 46.0 %   MCV 89.6 78.0 - 100.0 fL   MCH 28.2 26.0 - 34.0 pg   MCHC 31.4 30.0 - 36.0 g/dL   RDW 17.5 (H) 11.5 - 15.5 %   Platelets 209 150 - 400 K/uL   Neutrophils Relative % 63 %   Neutro Abs 4.7 1.7 - 7.7 K/uL   Lymphocytes Relative 30 %   Lymphs Abs 2.2 0.7 - 4.0 K/uL   Monocytes Relative 6 %   Monocytes Absolute 0.5 0.1 - 1.0 K/uL   Eosinophils Relative 1 %   Eosinophils Absolute 0.1 0.0 - 0.7 K/uL   Basophils Relative 0 %   Basophils Absolute 0.0 0.0 - 0.1 K/uL  Protime-INR     Status: None   Collection Time: 11/19/15  9:26 AM  Result Value Ref Range   Prothrombin Time 14.7 11.6 - 15.2 seconds   INR 1.13 0.00 - 1.49  Ammonia     Status: None   Collection Time: 11/19/15  9:27 AM  Result Value Ref Range   Ammonia 31 9 - 35 umol/L  TSH     Status: None   Collection Time: 11/19/15  9:27 AM  Result Value Ref Range   TSH 3.565 0.350 - 4.500 uIU/mL  I-Stat CG4 Lactic Acid, ED     Status: None   Collection Time: 11/19/15  9:44 AM  Result Value Ref Range   Lactic Acid, Venous 0.82 0.5 - 2.0 mmol/L  Sedimentation rate     Status: Abnormal   Collection Time: 11/19/15 10:50 AM  Result Value Ref Range   Sed Rate 26 (H) 0 - 22 mm/hr     ROS: no other history than HPI /  Pt poor historian.  Physical Exam: Filed Vitals:   11/19/15 1500 11/19/15 1515  BP: 174/108 196/109  Pulse:  78  Temp:     Resp: 21 25     General: thin light skinned AAF , NAD ,chronically ill HEENT: [place], MMM.   Neck: no jvd of bruit Heart: RRR, 2/6 sem , no rub or gallop Lungs: CTA bilat, Nonlabored breathing Abdomen: soft NT, ND Extremities: no pedal edema Skin: no overt rash Neuro: alert OX3, slurred speech, moves allextrem to commands Dialysis Access: R IJ perm cath L FA AVF multiple aneurysmal spots  No bruit/   Dialysis Orders: Center: [facility] T[facility] . EDW 54.0kg HD Bath 2.0k,2.0ca  Time 4hrs Heparin 3000. Access R IJ perm cath     Hectoral.5 mcg po /HD  Other op labs hgb 11.0 Ca 10 phos 6,5 pth 1108  Assessment/Plan 1. Slurred Speech/ transient AMS = Admit team wu and Neuro consult eval 2. ESRD -  Mild ^K reck pre hd todat ,TTS schedule 3. Hypertension/volume  - bp high hd today avoid bp drop on hd bo meds 4. Anemia  - hgb 11.1 no esa 5. Metabolic bone disease -   Vit d and binders 6. hO HD access problem- had VVS scheduled new access this week / may need to cancel with New neuro problem. 7. HO Anxiety  Ernest Haber, PA-C [place] [phone] 11/19/2015, 3:51 PM

## 2015-11-19 NOTE — ED Notes (Signed)
Report attempted to 5W 

## 2015-11-19 NOTE — ED Notes (Signed)
Per EMS - pt seen Saturday for stroke-like symptoms and admitted. Pt d/c home. Pt LSN normal yesterday. Pt lives alone and caregiver came to take pt to dialysis today. Pt gait more unsteady than normal and speech garbled. Pt brought to dialysis by caregiver and then EMS called. BP 200/110, hr 60bpm, CBG 99.

## 2015-11-19 NOTE — H&P (Signed)
[facility] History and Physical  [name] UJW:[id number] DOB: 06/17/1951 DOA: 11/19/2015  Referring physician: Dr Donnald Garre - [facility] PCP: Katy Apo, MD   Chief Complaint: Slurred speech and general malaise Level 5 caveat: pt parrots a lot of what is said by health care workers and sister in the room which sometimes does not apply to pts current complaints.    HPI: [name] is a 64 y.o. female  Patient has been feeling "ill" over the last several days. Constant. Getting worse. This is associated with slurred speech and somewhat odd behavior which started this morning when patient awoke. Symptoms are constant but not getting better or worsens onset. This is associated with an unsteady gait. Patient denies any symptoms of chest pain, shortness of breath, cough, abdominal pain, dysuria, frequency, back pain, fevers. Patient goes to dialysis Tuesday Thursday and Saturday and was due for dialysis today but felt too poorly to go.  Of note patient was discharged from [facility] on 11/17/2015 after workup for left facial numbness. Patient was diagnosed with HSV vestibular neuritis. MRI at that time revealed small vessel disease and widespread areas of chronic microhemorrhages but no evidence of acute stroke. Patient was discharged on steroids and also likely are.    Review of Systems:  Per HPI w/ all other systems negative.  Difficult to ascertain detailed ROS due to pts mental status.     Past Medical History  Diagnosis Date  . Anxiety   . Blood transfusion   . Depression   . GERD (gastroesophageal reflux disease)   . Hyperlipidemia   . Hypertension   . Neuromuscular disorder (HCC)     neuropathy hand and legs  . Weight loss, unintentional   . Adenomatous polyp of colon 10/2010  . Diverticula, colon   . CHF (congestive heart failure) (HCC)   . 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  Had deceased donor kidney transplant in 1996.  Had some early rejection then stable function for years, then had slow decline of function and went back on hemodialysis in 2012.  Gets HD TTS schedule at [facility] on [facility] still using L forearm AVF.     Marland Kitchen Anemia in CKD (chronic kidney disease) 11/07/2012  . Arthritis   . Osteoporosis    Past Surgical History  Procedure Laterality Date  . Kidney transplant  1996  . Back surgery    . Cervical fusion    . Av fistula placement      for dialysis  . Cholecystectomy  12/02/2012    Procedure: LAPAROSCOPIC CHOLECYSTECTOMY WITH INTRAOPERATIVE CHOLANGIOGRAM;  Surgeon: Mariella Saa, MD;  Location: [facility] OR;  Service: General;  Laterality: N/A;   Social History:  reports that she quit smoking about 23 years ago. Her smoking use included Cigarettes. She has never used smokeless tobacco. She reports that she does not drink alcohol or use illicit drugs.  Allergies  Allergen Reactions  . Sulfa Antibiotics Other (See Comments)    Both parents allergic-so will not take    Family History  Problem Relation Age of Onset  . Colon cancer Brother   . Cancer Brother   . Coronary artery disease Mother 38  . Hyperlipidemia Mother   . Hypertension Mother   . Esophageal cancer Neg Hx   . Stomach cancer Neg Hx   . Rectal cancer Neg Hx  Prior to Admission medications   Medication Sig Start Date End Date Taking? Authorizing Provider  acetaminophen (TYLENOL) 325 MG tablet Take 650 mg by mouth every 6 (six) hours as needed for mild pain.   Yes Historical Provider, MD  allopurinol (ZYLOPRIM) 300 MG tablet Take 150 mg by mouth at bedtime.   Yes Historical Provider, MD  ALPRAZolam (XANAX) 0.25 MG tablet Take 0.25 mg by mouth 2 (two) times daily.    Yes Historical Provider, MD  aspirin 81 MG chewable tablet Chew 81 mg by mouth daily.   Yes Historical Provider, MD  b complex-vitamin c-folic acid (NEPHRO-VITE) 0.8 MG TABS tablet Take  1 tablet by mouth at bedtime.   Yes Historical Provider, MD  calcium carbonate (TUMS) 500 MG chewable tablet Chew 500 mg by mouth 3 (three) times daily.   Yes Historical Provider, MD  cinacalcet (SENSIPAR) 30 MG tablet Take 30 mg by mouth daily.   Yes Historical Provider, MD  doxazosin (CARDURA) 8 MG tablet Take 1 tablet (8 mg total) by mouth 2 (two) times daily as needed (hypertension). 11/17/15  Yes Elease Etienne, MD  HYDROcodone-acetaminophen (NORCO) 5-325 MG tablet Take 1 tablet by mouth every 6 (six) hours as needed for moderate pain. 11/17/15  Yes Elease Etienne, MD  labetalol (NORMODYNE) 300 MG tablet Take 300 mg by mouth 2 (two) times daily. 11/09/12  Yes Penny Pia, MD  losartan (COZAAR) 100 MG tablet Take 100 mg by mouth at bedtime.    Yes Historical Provider, MD  promethazine (PHENERGAN) 25 MG tablet Take 25 mg by mouth every 6 (six) hours as needed for nausea or vomiting.   Yes Historical Provider, MD  simvastatin (ZOCOR) 10 MG tablet Take 10 mg by mouth at bedtime.    Yes Historical Provider, MD  sorbitol 70 % solution Take 60 mLs by mouth daily as needed (constipation).    Yes Historical Provider, MD  traMADol (ULTRAM) 50 MG tablet Take 50 mg by mouth 2 (two) times daily.  10/25/15  Yes Historical Provider, MD  valACYclovir (VALTREX) 500 MG tablet Take 1 tablet (500 mg total) by mouth daily. Take for 6 days beginning 11/18/15. 11/18/15  Yes Elease Etienne, MD  zolpidem (AMBIEN) 10 MG tablet Take 10 mg by mouth at bedtime.   Yes Historical Provider, MD  aspirin 325 MG tablet Take 1 tablet (325 mg total) by mouth daily. Patient not taking: Reported on 11/19/2015 05/10/15   Penny Pia, MD  methylPREDNISolone (MEDROL DOSEPAK) 4 MG TBPK tablet Take as per instructions. Patient not taking: Reported on 11/19/2015 11/17/15   Elease Etienne, MD  UNABLE TO FIND Outpatient physical therapy for balance rehabilitation. Diagnosis: vestibular neuritis. Patient not taking: Reported on 11/19/2015  11/17/15   Elease Etienne, MD   Physical Exam: Filed Vitals:   11/19/15 1419 11/19/15 1445 11/19/15 1500 11/19/15 1515  BP: 179/100 200/102 174/108 196/109  Pulse: 72 75  78  Temp:      TempSrc:      Resp: 17 23 21 25   SpO2: 100% 99%  94%    Wt Readings from Last 3 Encounters:  11/16/15 59.7 kg (131 lb 9.8 oz)  10/28/15 59.875 kg (132 lb)  05/09/15 55.7 kg (122 lb 12.7 oz)    General:  Appears calm and comfortable Eyes:  PERRL, EOMI, ENT:  grossly normal hearing, lips & tongue Neck:  no LAD, masses or thyromegaly Cardiovascular:  RRR, III/VI systolic murmur. No LE edema. Fistulas patent Respiratory:  CTA bilaterally, no w/r/r. Normal respiratory effort. Abdomen:  soft, ntnd Skin:  no rash or induration seen on limited exam Musculoskeletal: global weakness. No focal weakness.  Psychiatric: Parroting of statements by family members or by myself when not entirely appropriate w/ regards to answering questions.  Neurologic: CN 2-12 grossly intact, moves all extremities in coordinated fashion. Dysmetria on R. Did not ambulate pt due to concern for falls.           Labs on Admission:  Basic Metabolic Panel:  Recent Labs Lab 11/16/15 1150 11/16/15 1200 11/17/15 0236 11/19/15 0926  NA 134* 137 133* 133*  K 3.5 3.4* 4.6 5.7*  CL 93* 94* 94* 91*  CO2 27  --  24 23  GLUCOSE 85 88 160* 82  BUN 9 11 28* 90*  CREATININE 2.54* 2.50* 4.40* 9.24*  CALCIUM 8.4*  --  9.8 8.7*   Liver Function Tests:  Recent Labs Lab 11/16/15 1150 11/19/15 0926  AST 23 23  ALT 8* 11*  ALKPHOS 94 82  BILITOT 0.5 0.4  PROT 7.4 6.7  ALBUMIN 3.4* 3.2*   No results for input(s): LIPASE, AMYLASE in the last 168 hours.  Recent Labs Lab 11/19/15 0927  AMMONIA 31   CBC:  Recent Labs Lab 11/16/15 1150 11/16/15 1200 11/19/15 0926  WBC 4.3  --  7.5  NEUTROABS 2.2  --  4.7  HGB 12.2 14.3 11.1*  HCT 38.3 42.0 35.3*  MCV 90.8  --  89.6  PLT 216  --  209   Cardiac Enzymes:  Recent  Labs Lab 11/19/15 0926  TROPONINI 0.03    BNP (last 3 results)  Recent Labs  01/22/15 0057  BNP 1438.6*    ProBNP (last 3 results) No results for input(s): PROBNP in the last 8760 hours.   CREATININE: 9.24 mg/dL ABNORMAL (09/81/19 1478) Estimated creatinine clearance - 5.2 mL/min  CBG: No results for input(s): GLUCAP in the last 168 hours.  Radiological Exams on Admission: No results found.   Assessment/Plan Active Problems:   ESRD (end stage renal disease) (HCC)   HTN (hypertension)   Hyperlipidemia   Stroke-like symptom   Neurologic abnormality   Altered mental state   Hyperkalemia   Anxiety   Insomnia  Slurred speech and change in mentation: Stroke vs Uremia/metabolic derangement, vs medication induced (valtrex and steroids). Pt discharged on 11/17/15 after stroke workup including MRI that was unrevealing. No sign of infection. Facial numbness persistent since last admission. Neuro consulted and feels that vestibular neuritis from last admission is not likely - Tele - neuro checks - Stop valtrex and methylprednisone - continue Prednisone per neuro.  - repeat MRI - due to new focal findings such as dysmetria - PT/OT - dialysis as below - repeat Echo  ESRD on dialysis: dialysis Tu,Th,Sa. Missed dialysis on day of admission. Pt followed by Dr. Marisue Humble of [facility]. Dr. Hyman Hopes consulted for dialysis. BUN 90, Cr 8.4. Using temporary dialysis cath due to clotting of fistula - Dialysis when able - Per nephrology - Continue Nephro-vite, Sensipar  HyperK: 5.7 on admission. Likely from renal impairment. Kayexalate given in ED. EKG w/o evidence of significant peaked T waves or arrhythmia.   - dialysis  GNF:AOZHY permissive HTN for 24-48hrs or until clears stroke symptoms - hold home cardura, Labetalol, losartan - Hydralazine PRN SBP>210 DBP >110  ANxiety: - continue xanax  HLD: - continue statin  Insomnia: - continue Ambien  Gout: - continue  allopurinol    Code Status: FULL  DVT Prophylaxis: Hep Family Communication: Daughter Disposition Plan: Pending Improvement    Debe Anfinson Shela Commons, MD Family Medicine [facility] [email or web address] Password [email or web address]

## 2015-11-19 NOTE — ED Notes (Signed)
Assisted patient off of bedpan, cleaned patient and placed a diaper on patient per patient's request; visitor at bedside

## 2015-11-19 NOTE — Progress Notes (Signed)
Lines reversed due to increasingly negative arterial pressures.  Will continue to monitor.

## 2015-11-19 NOTE — ED Notes (Signed)
Pt returned from MRI - one MRI scanner is not working at this time and pt has to wait until next scanner is available again.

## 2015-11-19 NOTE — ED Notes (Signed)
Myself and Cricket, RN washed patient from lower back to the bottom of her feet; patient was covered in feces; washed patient with warm water and soap; dried patient off; changed all stretcher linens and patient's gown; placed 3 chuks underneath patient after applying a diaper on patient and clean dry gown; patient comfortable now and awaiting to be transported to MRI

## 2015-11-19 NOTE — ED Notes (Signed)
Pt states she does not make urine but occasionally able to obtain urine specimen w/ I&O cath. This RN attempted to I&O cath but unable to obtain urine specimen. Dr. Johnney Killian aware.

## 2015-11-19 NOTE — ED Notes (Signed)
Matt from dialysis called to advise pt able to go to [facility]

## 2015-11-19 NOTE — ED Notes (Signed)
Patient being transported to MRI; visitors waiting in room

## 2015-11-19 NOTE — Consult Note (Signed)
NEURO HOSPITALIST CONSULT NOTE   Requestig physician: Dr. Johnney Killian   Reason for Consult: continued imbalance and slurred speech.   HPI:                                                                                                                                          [name] is an 64 y.o. female who was just in the hospital for a left facial droop. At that time MRI showed no acute infarct and she was diagnosed with Vestibular neuritis. She was placed on Acyclovir and Methyl prednisone and had some improvement. She was discharged 12/4 and returned today 12/6 for same symptoms.  She lives alone and sister is accompanying her.  Patient initially had no slurred speech but as she talked she intermittently showed some slurring of specific words.  She tells me she falls to "that side " but when asked which side she cannot tell me.  She talks with her eyes closed and makes no eye contact.  She has multitude of complaints ranging from her shunt to her balance. I asked her to form specific words in which she would contort her mouth in many different strange position while talking.   Past Medical History  Diagnosis Date  . Anxiety   . Blood transfusion   . Depression   . GERD (gastroesophageal reflux disease)   . Hyperlipidemia   . Hypertension   . Neuromuscular disorder (HCC)     neuropathy hand and legs  . Weight loss, unintentional   . Adenomatous polyp of colon 10/2010  . Diverticula, colon   . CHF (congestive heart failure) ([place])   . 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  Had deceased donor kidney transplant in 1996.  Had some early rejection then stable function for years, then had slow decline of function and went back on hemodialysis in 2012.  Gets HD TTS schedule at [facility] on [facility] still using L forearm AVF.     Marland Kitchen Anemia in CKD (chronic kidney disease)  11/07/2012  . Arthritis   . Osteoporosis     Past Surgical History  Procedure Laterality Date  . Kidney transplant  1996  . Back surgery    . Cervical fusion    . Av fistula placement      for dialysis  . Cholecystectomy  12/02/2012    Procedure: LAPAROSCOPIC CHOLECYSTECTOMY WITH INTRAOPERATIVE CHOLANGIOGRAM;  Surgeon: Edward Jolly, MD;  Location: [facility] OR;  Service: General;  Laterality: N/A;    Family History  Problem Relation Age of Onset  . Colon cancer Brother   . Cancer Brother   . Coronary artery disease Mother 50  . Hyperlipidemia Mother   .  Hypertension Mother   . Esophageal cancer Neg Hx   . Stomach cancer Neg Hx   . Rectal cancer Neg Hx     Social History:  reports that she quit smoking about 23 years ago. Her smoking use included Cigarettes. She has never used smokeless tobacco. She reports that she does not drink alcohol or use illicit drugs.  Allergies  Allergen Reactions  . Sulfa Antibiotics Other (See Comments)    Both parents allergic-so will not take    MEDICATIONS:                                                                                                                     No current facility-administered medications for this encounter.   Current Outpatient Prescriptions  Medication Sig Dispense Refill  . acetaminophen (TYLENOL) 325 MG tablet Take 650 mg by mouth every 6 (six) hours as needed for mild pain.    Marland Kitchen allopurinol (ZYLOPRIM) 300 MG tablet Take 150 mg by mouth at bedtime.    . ALPRAZolam (XANAX) 0.25 MG tablet Take 0.25 mg by mouth 2 (two) times daily.     Marland Kitchen aspirin 81 MG chewable tablet Chew 81 mg by mouth daily.    Marland Kitchen b complex-vitamin c-folic acid (NEPHRO-VITE) 0.8 MG TABS tablet Take 1 tablet by mouth at bedtime.    . calcium carbonate (TUMS) 500 MG chewable tablet Chew 500 mg by mouth 3 (three) times daily.    . cinacalcet (SENSIPAR) 30 MG tablet Take 30 mg by mouth daily.    Marland Kitchen doxazosin (CARDURA) 8 MG tablet Take 1 tablet (8  mg total) by mouth 2 (two) times daily as needed (hypertension).    Marland Kitchen HYDROcodone-acetaminophen (NORCO) 5-325 MG tablet Take 1 tablet by mouth every 6 (six) hours as needed for moderate pain.    Marland Kitchen labetalol (NORMODYNE) 300 MG tablet Take 300 mg by mouth 2 (two) times daily.    Marland Kitchen losartan (COZAAR) 100 MG tablet Take 100 mg by mouth at bedtime.     . promethazine (PHENERGAN) 25 MG tablet Take 25 mg by mouth every 6 (six) hours as needed for nausea or vomiting.    . simvastatin (ZOCOR) 10 MG tablet Take 10 mg by mouth at bedtime.     . sorbitol 70 % solution Take 60 mLs by mouth daily as needed (constipation).     . traMADol (ULTRAM) 50 MG tablet Take 50 mg by mouth 2 (two) times daily.   0  . valACYclovir (VALTREX) 500 MG tablet Take 1 tablet (500 mg total) by mouth daily. Take for 6 days beginning 11/18/15. 6 tablet 0  . zolpidem (AMBIEN) 10 MG tablet Take 10 mg by mouth at bedtime.    Marland Kitchen aspirin 325 MG tablet Take 1 tablet (325 mg total) by mouth daily. (Patient not taking: Reported on 11/19/2015) 30 tablet 0  . methylPREDNISolone (MEDROL DOSEPAK) 4 MG TBPK tablet Take as per instructions. (Patient not taking: Reported on 11/19/2015) 21 tablet 0  . UNABLE TO  FIND Outpatient physical therapy for balance rehabilitation. Diagnosis: vestibular neuritis. (Patient not taking: Reported on 11/19/2015) 1 Units 0      ROS:                                                                                                                                       History obtained from the patient  General ROS: negative for - chills, fatigue, fever, night sweats, weight gain or weight loss Psychological ROS: negative for - behavioral disorder, hallucinations, memory difficulties, mood swings or suicidal ideation Ophthalmic ROS: negative for - blurry vision, double vision, eye pain or loss of vision ENT ROS: negative for - epistaxis, nasal discharge, oral lesions, sore throat, tinnitus or vertigo Allergy and  Immunology ROS: negative for - hives or itchy/watery eyes Hematological and Lymphatic ROS: negative for - bleeding problems, bruising or swollen lymph nodes Endocrine ROS: negative for - galactorrhea, hair pattern changes, polydipsia/polyuria or temperature intolerance Respiratory ROS: negative for - cough, hemoptysis, shortness of breath or wheezing Cardiovascular ROS: negative for - chest pain, dyspnea on exertion, edema or irregular heartbeat Gastrointestinal ROS: negative for - abdominal pain, diarrhea, hematemesis, nausea/vomiting or stool incontinence Genito-Urinary ROS: negative for - dysuria, hematuria, incontinence or urinary frequency/urgency Musculoskeletal ROS: negative for - joint swelling or muscular weakness Neurological ROS: as noted in HPI Dermatological ROS: negative for rash and skin lesion changes   Blood pressure 182/108, pulse 68, temperature 97.8 F (36.6 C), temperature source Oral, resp. rate 11, SpO2 100 %.   Neurologic Examination:                                                                                                      HEENT-  Normocephalic, no lesions, without obvious abnormality.  Normal external eye and conjunctiva.  Normal TM's bilaterally.  Normal auditory canals and external ears. Normal external nose, mucus membranes and septum.  Normal pharynx. Cardiovascular- S1, S2 normal, pulses palpable throughout   Lungs- chest clear, no wheezing, rales, normal symmetric air entry Abdomen- normal findings: bowel sounds normal Extremities- no edema Lymph-no adenopathy palpable Musculoskeletal-no joint tenderness, deformity or swelling Skin-warm and dry, no hyperpigmentation, vitiligo, or suspicious lesions  Neurological Examination  No vesicles noted on otoscope exam  Mental Status: Alert, oriented, thought content appropriate.  Speech mildly dysarthric when distracted without evidence of aphasia.  Able to follow 3 step commands without  difficulty. Cranial Nerves: II: Discs flat bilaterally; Visual fields grossly normal, pupils equal, round, reactive to light  and accommodation III,IV, VI: ptosis not present, extra-ocular motions intact bilaterally V,VII: smile symmetric, facial light touch sensation normal bilaterally VIII: hearing normal bilaterally IX,X: uvula rises symmetrically XI: bilateral shoulder shrug XII: midline tongue extension Motor: Right : Upper extremity   5/5    Left:     Upper extremity   5/5  Lower extremity   5/5     Lower extremity   5/5 Tone and bulk:normal tone throughout; no atrophy noted Sensory: Pinprick and light touch intact throughout, with length dependent neuropathy in her feet Deep Tendon Reflexes: 2+ and symmetric throughout Plantars: Right: downgoing   Left: downgoing Cerebellar: normal finger-to-nose,  and normal heel-to-shin test Gait: she initially flopped herself on her back when trying to get up and once up actively jerked herself in all directions.  Upon placing her back in bed she actively flopped herself sideways on the bed.       Lab Results: Basic Metabolic Panel:  Recent Labs Lab 11/16/15 1150 11/16/15 1200 11/17/15 0236 11/19/15 0926  NA 134* 137 133* 133*  K 3.5 3.4* 4.6 5.7*  CL 93* 94* 94* 91*  CO2 27  --  24 23  GLUCOSE 85 88 160* 82  BUN 9 11 28* 90*  CREATININE 2.54* 2.50* 4.40* 9.24*  CALCIUM 8.4*  --  9.8 8.7*    Liver Function Tests:  Recent Labs Lab 11/16/15 1150 11/19/15 0926  AST 23 23  ALT 8* 11*  ALKPHOS 94 82  BILITOT 0.5 0.4  PROT 7.4 6.7  ALBUMIN 3.4* 3.2*   No results for input(s): LIPASE, AMYLASE in the last 168 hours.  Recent Labs Lab 11/19/15 0927  AMMONIA 31    CBC:  Recent Labs Lab 11/16/15 1150 11/16/15 1200 11/19/15 0926  WBC 4.3  --  7.5  NEUTROABS 2.2  --  4.7  HGB 12.2 14.3 11.1*  HCT 38.3 42.0 35.3*  MCV 90.8  --  89.6  PLT 216  --  209    Cardiac Enzymes:  Recent Labs Lab 11/19/15 0926   TROPONINI 0.03    Lipid Panel:  Recent Labs Lab 11/17/15 0236  CHOL 168  TRIG 50  HDL 55  CHOLHDL 3.1  VLDL 10  LDLCALC 103*    CBG: No results for input(s): GLUCAP in the last 168 hours.  Microbiology: Results for orders placed or performed during the hospital encounter of 05/08/15  Surgical PCR screen     Status: None   Collection Time: 05/09/15  2:37 PM  Result Value Ref Range Status   MRSA, PCR NEGATIVE NEGATIVE Final   Staphylococcus aureus NEGATIVE NEGATIVE Final    Comment:        The Xpert SA Assay (FDA approved for NASAL specimens in patients over 78 years of age), is one component of a comprehensive surveillance program.  Test performance has been validated by [facility] for patients greater than or equal to 3 year old. It is not intended to diagnose infection nor to guide or monitor treatment.     Coagulation Studies:  Recent Labs  11/16/15 1150 11/19/15 0926  LABPROT 14.0 14.7  INR 1.06 1.13    Imaging: No results found.     Assessment and plan per attending neurologist  Etta Quill PA-C [facility] [phone]  11/19/2015, 11:38 AM   Assessment/Plan: 64 yO female with ongoing gait instability and now slurred speech. I do wonder if there is a metabolic component, especially given the report of myoclonus. I do not see  clear findings of labrynthitis and therefore would favor discontinuing steroids/valtrex at this time.   Recommend 1) repeat MRI brain WO contrast 2) PT  Roland Rack, MD [facility] [phone]  If 7pm- 7am, please page neurology on call as listed in [place].

## 2015-11-19 NOTE — Progress Notes (Signed)
Pt arrived to unit per bed at 1739.  Report received from ED nurse, Cricket.  A & O X 4 Lungs diminished. Regular R&R Generalized BLE edema.  Report received indicated LUAVF.  Per pt, her R tunneled IJ is being used for dialysis until a RUAVF can be placed.  RIJ accessed and ports aspirated.  Flushed with NS.  Tx initiated at 1752 with goal of 2548mL and net of 2L.  Will continue to monitor.

## 2015-11-20 ENCOUNTER — Encounter (HOSPITAL_COMMUNITY): Payer: Self-pay | Admitting: *Deleted

## 2015-11-20 ENCOUNTER — Other Ambulatory Visit (HOSPITAL_COMMUNITY): Payer: Medicare Other

## 2015-11-20 DIAGNOSIS — I10 Essential (primary) hypertension: Secondary | ICD-10-CM

## 2015-11-20 DIAGNOSIS — Z992 Dependence on renal dialysis: Secondary | ICD-10-CM | POA: Diagnosis not present

## 2015-11-20 DIAGNOSIS — N186 End stage renal disease: Secondary | ICD-10-CM | POA: Diagnosis not present

## 2015-11-20 DIAGNOSIS — R4182 Altered mental status, unspecified: Secondary | ICD-10-CM | POA: Diagnosis not present

## 2015-11-20 DIAGNOSIS — R4781 Slurred speech: Secondary | ICD-10-CM | POA: Diagnosis not present

## 2015-11-20 DIAGNOSIS — I120 Hypertensive chronic kidney disease with stage 5 chronic kidney disease or end stage renal disease: Secondary | ICD-10-CM | POA: Diagnosis not present

## 2015-11-20 DIAGNOSIS — D631 Anemia in chronic kidney disease: Secondary | ICD-10-CM | POA: Diagnosis not present

## 2015-11-20 DIAGNOSIS — I132 Hypertensive heart and chronic kidney disease with heart failure and with stage 5 chronic kidney disease, or end stage renal disease: Secondary | ICD-10-CM | POA: Diagnosis not present

## 2015-11-20 DIAGNOSIS — N2581 Secondary hyperparathyroidism of renal origin: Secondary | ICD-10-CM | POA: Diagnosis not present

## 2015-11-20 LAB — CBC
HCT: 37 % (ref 36.0–46.0)
HEMATOCRIT: 37.3 % (ref 36.0–46.0)
HEMOGLOBIN: 11.7 g/dL — AB (ref 12.0–15.0)
HEMOGLOBIN: 11.8 g/dL — AB (ref 12.0–15.0)
MCH: 28.3 pg (ref 26.0–34.0)
MCH: 28.2 pg (ref 26.0–34.0)
MCHC: 31.6 g/dL (ref 30.0–36.0)
MCHC: 31.6 g/dL (ref 30.0–36.0)
MCV: 89.2 fL (ref 78.0–100.0)
MCV: 89.4 fL (ref 78.0–100.0)
PLATELETS: 234 10*3/uL (ref 150–400)
Platelets: 218 10*3/uL (ref 150–400)
RBC: 4.17 MIL/uL (ref 3.87–5.11)
RBC: 4.15 MIL/uL (ref 3.87–5.11)
RDW: 17.5 % — ABNORMAL HIGH (ref 11.5–15.5)
RDW: 17.5 % — AB (ref 11.5–15.5)
WBC: 5.3 10*3/uL (ref 4.0–10.5)
WBC: 5.9 10*3/uL (ref 4.0–10.5)

## 2015-11-20 LAB — COMPREHENSIVE METABOLIC PANEL
ALT: 12 U/L — ABNORMAL LOW (ref 14–54)
ANION GAP: 13 (ref 5–15)
AST: 21 U/L (ref 15–41)
Albumin: 3.2 g/dL — ABNORMAL LOW (ref 3.5–5.0)
Alkaline Phosphatase: 89 U/L (ref 38–126)
BILIRUBIN TOTAL: 0.6 mg/dL (ref 0.3–1.2)
BUN: 22 mg/dL — AB (ref 6–20)
CO2: 26 mmol/L (ref 22–32)
Calcium: 8.7 mg/dL — ABNORMAL LOW (ref 8.9–10.3)
Chloride: 96 mmol/L — ABNORMAL LOW (ref 101–111)
Creatinine, Ser: 4.24 mg/dL — ABNORMAL HIGH (ref 0.44–1.00)
GFR calc Af Amer: 12 mL/min — ABNORMAL LOW (ref >60)
GFR, EST NON AFRICAN AMERICAN: 10 mL/min — AB (ref >60)
Glucose, Bld: 87 mg/dL (ref 65–99)
POTASSIUM: 3.8 mmol/L (ref 3.5–5.1)
Sodium: 135 mmol/L (ref 135–145)
TOTAL PROTEIN: 6.8 g/dL (ref 6.5–8.1)

## 2015-11-20 LAB — CREATININE, SERUM
CREATININE: 3.56 mg/dL — AB (ref 0.44–1.00)
GFR calc Af Amer: 14 mL/min — ABNORMAL LOW (ref >60)
GFR, EST NON AFRICAN AMERICAN: 12 mL/min — AB (ref >60)

## 2015-11-20 LAB — MAGNESIUM: MAGNESIUM: 2 mg/dL (ref 1.7–2.4)

## 2015-11-20 LAB — PHOSPHORUS: PHOSPHORUS: 3.4 mg/dL (ref 2.5–4.6)

## 2015-11-20 MED ORDER — DEXTROSE 5 % IV SOLN: 1.5000 g | INTRAVENOUS | Status: DC

## 2015-11-20 MED ORDER — CHLORHEXIDINE GLUCONATE CLOTH 2 % EX PADS: 6.0000 | MEDICATED_PAD | Freq: Once | CUTANEOUS | Status: DC

## 2015-11-20 MED ORDER — SODIUM CHLORIDE 0.9 % IV SOLN: INTRAVENOUS | Status: DC

## 2015-11-20 NOTE — Care Management Note (Signed)
Case Management Note  Patient Details  Name: [name] MRN: [id number] Date of Birth: Jan 28, 1951  Subjective/Objective:     Date: 11/20/15 Spoke with patient at the bedside.  Introduced self as Tourist information centre manager and explained role in discharge planning and how to be reached.  Verified patient lives in town, alone.   Expressed potential need for no other DME.  Verified patient anticipates to go  home alone, at time of discharge and will have a tennant Mr. Mortimer Fries Peay who lives up stairs and comes to check on her often.  Patient denied needing help with their medication.  Patient  takes the bus to MD appointments.  Verified patient has PCP [name].   Plan: CM will continue to follow for discharge planning and [facility] resources.                Action/Plan:   Expected Discharge Date:                  Expected Discharge Plan:  [place]  In-House Referral:     Discharge planning Services     Post Acute Care Choice:  Home Health Choice offered to:  Patient  DME Arranged:    DME Agency:     HH Arranged:  RN, PT [place] Agency:  [place]  Status of Service:  Completed, signed off  Medicare Important Message Given:    Date Medicare IM Given:    Medicare IM give by:    Date Additional Medicare IM Given:    Additional Medicare Important Message give by:     If discussed at [place] of Stay Meetings, dates discussed:    Additional Comments:  Zenon Mayo, RN 11/20/2015, 2:01 PM

## 2015-11-20 NOTE — Evaluation (Signed)
Physical Therapy Evaluation Patient Details Name: [name] MRN: [id number] DOB: 10/04/1951 Today's Date: 11/20/2015   History of Present Illness  Patient has been feeling "ill" over the last several days. Constant. Getting worse. This is associated with slurred speech and somewhat odd behavior which started this morning when patient awoke. Symptoms are constant but not getting better or worsens onset. This is associated with an unsteady gait.   Clinical Impression  Pt present with mild gait instability. Pt demonstrated some Right hip flexor weakness and decreased fine motor coordination. Pt reported she is back to her normal and feels she can safely d/c home. Pt was able to ambulate in the halls with min guard assist. Pt demonstrated gait instability and scissoring of LEs, but was able to self-correct. Pt did not have much improvement with balance when given a cane. Pt reports her family can provide assistance if needed. I would recommend continued acute skilled PT until d/c home for balance training to improve safety and maximize mobility. Recommend d/c home with HHPT follow-up.     Follow Up Recommendations Home health PT;Supervision for mobility/OOB    Equipment Recommendations  None recommended by PT    Recommendations for Other Services       Precautions / Restrictions Precautions Precautions: Fall Restrictions Weight Bearing Restrictions: No      Mobility  Bed Mobility Overal bed mobility: Independent                Transfers Overall transfer level: Modified independent Equipment used: None                Ambulation/Gait Ambulation/Gait assistance: Min guard Ambulation Distance (Feet): 400 Feet Assistive device: None;Straight cane Gait Pattern/deviations: Step-through pattern;Scissoring   Gait velocity interpretation: Below normal speed for age/gender General Gait Details: pt unsteady at times, pt was able to accept minimal challenges to balance  during gait with min guard. Pt was able to self correct for LOB. Pt did demonstrated sizzoring of LE at times. Pt walked with a cane and it did not seems to improve her balance. Pt reported she does not like the cane.   Stairs            Wheelchair Mobility    Modified Rankin (Stroke Patients Only)       Balance Overall balance assessment: Needs assistance Sitting-balance support: No upper extremity supported Sitting balance-Leahy Scale: Good     Standing balance support: No upper extremity supported Standing balance-Leahy Scale: Fair               High level balance activites: Direction changes;Turns;Head turns               Pertinent Vitals/Pain Pain Assessment: No/denies pain    Home Living Family/patient expects to be discharged to:: Private residence Living Arrangements: Alone (tenant upstairs) Available Help at Discharge: Friend(s);Family;Available PRN/intermittently Type of Home: House Home Access: Stairs to enter   Entrance Stairs-Number of Steps: 2 Home Layout: Two level;Able to live on main level with bedroom/bathroom Home Equipment: None      Prior Function Level of Independence: Independent               Hand Dominance        Extremity/Trunk Assessment   Upper Extremity Assessment: Defer to OT evaluation           Lower Extremity Assessment: RLE deficits/detail;LLE deficits/detail RLE Deficits / Details: hip flexion 3/5 all other WFL    Cervical / Trunk Assessment: Normal  Communication   Communication: No difficulties  Cognition Arousal/Alertness: Awake/alert Behavior During Therapy: WFL for tasks assessed/performed Overall Cognitive Status: Within Functional Limits for tasks assessed                      General Comments General comments (skin integrity, edema, etc.): Pt reports she is back to normal and wants to d/c home. Pt does not feel she has any balance problems.    Exercises         Assessment/Plan    PT Assessment Patient needs continued PT services  PT Diagnosis Difficulty walking   PT Problem List Decreased strength;Decreased balance;Decreased mobility  PT Treatment Interventions DME instruction;Gait training;Stair training;Functional mobility training;Patient/family education;Therapeutic activities;Balance training;Therapeutic exercise   PT Goals (Current goals can be found in the Care Plan section) Acute Rehab PT Goals Patient Stated Goal: return home, be more active PT Goal Formulation: With patient Time For Goal Achievement: 12/04/15 Potential to Achieve Goals: Good    Frequency Min 3X/week   Barriers to discharge        Co-evaluation               End of Session Equipment Utilized During Treatment: Gait belt Activity Tolerance: Patient tolerated treatment well Patient left: in chair;with call bell/phone within reach Nurse Communication: Mobility status    Functional Assessment Tool Used: clinincal observation Functional Limitation: Mobility: Walking and moving around Mobility: Walking and Moving Around Current Status [phone]): At least 1 percent but less than 20 percent impaired, limited or restricted Mobility: Walking and Moving Around Goal Status [phone]): 0 percent impaired, limited or restricted    Time: 0932-1001 PT Time Calculation (min) (ACUTE ONLY): 29 min   Charges:   PT Evaluation $Initial PT Evaluation Tier I: 1 Procedure PT Treatments $Gait Training: 8-22 mins   PT G Codes:   PT G-Codes **NOT FOR INPATIENT CLASS** Functional Assessment Tool Used: clinincal observation Functional Limitation: Mobility: Walking and moving around Mobility: Walking and Moving Around Current Status [id number]): At least 1 percent but less than 20 percent impaired, limited or restricted Mobility: Walking and Moving Around Goal Status [phone]): 0 percent impaired, limited or restricted    Lelon Mast 11/20/2015, 10:02 AM

## 2015-11-20 NOTE — Progress Notes (Signed)
NURSING PROGRESS NOTE  [name] [id number] Discharge Data: 11/20/2015 12:59 PM Attending Provider: Kelvin Cellar, MD [id number], MD     Anda Kraft to be D/C'd Home per MD order.  Discussed with the patient the After Visit Summary and all questions fully answered. All IV's discontinued with no bleeding noted. All belongings returned to patient for patient to take home.   Last Vital Signs:  Blood pressure 144/106, pulse 89, temperature 99.1 F (37.3 C), temperature source Oral, resp. rate 16, height 5\' 2"  (1.575 m), weight 56.756 kg (125 lb 2 oz), SpO2 98 %.  Discharge Medication List   Medication List    STOP taking these medications        UNABLE TO FIND      TAKE these medications        acetaminophen 325 MG tablet  Commonly known as:  TYLENOL  Take 650 mg by mouth every 6 (six) hours as needed for mild pain.     allopurinol 300 MG tablet  Commonly known as:  ZYLOPRIM  Take 150 mg by mouth at bedtime.     ALPRAZolam 0.25 MG tablet  Commonly known as:  XANAX  Take 0.25 mg by mouth 2 (two) times daily.     aspirin 81 MG chewable tablet  Chew 81 mg by mouth daily.     b complex-vitamin c-folic acid 0.8 MG Tabs tablet  Take 1 tablet by mouth at bedtime.     cinacalcet 30 MG tablet  Commonly known as:  SENSIPAR  Take 30 mg by mouth daily.     doxazosin 8 MG tablet  Commonly known as:  CARDURA  Take 1 tablet (8 mg total) by mouth 2 (two) times daily as needed (hypertension).     HYDROcodone-acetaminophen 5-325 MG tablet  Commonly known as:  NORCO  Take 1 tablet by mouth every 6 (six) hours as needed for moderate pain.     labetalol 300 MG tablet  Commonly known as:  NORMODYNE  Take 300 mg by mouth 2 (two) times daily.     losartan 100 MG tablet  Commonly known as:  COZAAR  Take 100 mg by mouth at bedtime.     promethazine 25 MG tablet  Commonly known as:  PHENERGAN  Take 25 mg by mouth every 6 (six) hours as needed for nausea or  vomiting.     simvastatin 10 MG tablet  Commonly known as:  ZOCOR  Take 10 mg by mouth at bedtime.     sorbitol 70 % solution  Take 60 mLs by mouth daily as needed (constipation).     traMADol 50 MG tablet  Commonly known as:  ULTRAM  Take 50 mg by mouth 2 (two) times daily.     TUMS 500 MG chewable tablet  Generic drug:  calcium carbonate  Chew 500 mg by mouth 3 (three) times daily.     zolpidem 10 MG tablet  Commonly known as:  AMBIEN  Take 10 mg by mouth at bedtime.         Doristine Devoid, RN

## 2015-11-20 NOTE — Progress Notes (Signed)
Subjective:  Alert, Tolerated HD last night , Speech back to baseline  Objective Vital signs in last 24 hours: Filed Vitals:   11/19/15 2130 11/19/15 2200 11/20/15 0215 11/20/15 0402  BP: 142/89 164/90 134/109 144/106  Pulse: 71 74 91 89  Temp:   99.1 F (37.3 C)   TempSrc:   Oral   Resp: 16 18 16    Height:    5\' 2"  (1.575 m)  Weight:    56.756 kg (125 lb 2 oz)  SpO2:   100% 98%   Weight change:    Physical Exam, Gen- NAD, slurred speech resolved Heart: RRR, 2/6 sem , no rub or gallop General: thin light skinned AAF , NAD  Lungs: CTA bilat, Nonlabored breathing Abdomen: soft NT, ND Extremities: no pedal edema Dialysis Access: R IJ perm cath L FA AVF multiple aneurysmal spots No bruit/   Dialysis Orders: Center: [facility] on TTS . EDW 54.0kg HD Bath 2.0k,2.0ca Time 4hrs Heparin 3000. Access R IJ perm cath  Hectoral.5 mcg po /HD  Other op labs hgb 11.0 Ca 10 phos 6,5 pth 1108  Problem/Plan: 1. Slurred Speech/ transient AMS = Slurred speech resolved/ Neuro and Admit team wu 2. ESRD - Mild ^K5.7 admit  Hd last pm /3.8 K this am ,TTS schedule 3. Hypertension/volume - bp high admit improved 134/109  today / on Losartin 100 mg qday/ 2.0l uf last pm  Wt 56.75 2 kg.edw 4. Anemia - hgb 11.8 no esa 5. Metabolic bone disease - Vit d and binders 6. hO HD access problem- had [facility] scheduled new access this week Friday 9th 7. HO Anxiety : Ernest Haber, PA-C [facility] Beeper [phone] 11/20/2015,10:53 AM    Labs: Basic Metabolic Panel:  Recent Labs Lab 11/19/15 0926 11/19/15 1856 11/20/15 0033 11/20/15 0554  NA 133* 133*  --  135  K 5.7* 4.7  --  3.8  CL 91* 91*  --  96*  CO2 23 22  --  26  GLUCOSE 82 88  --  87  BUN 90* 98*  --  22*  CREATININE 9.24* 9.86* 3.56* 4.24*  CALCIUM 8.7* 8.7*  --  8.7*  PHOS  --  6.3* 3.4  --    Liver Function Tests:  Recent Labs Lab 11/16/15 1150 11/19/15 0926 11/19/15 1856 11/20/15 0554  AST 23 23  --  21   ALT 8* 11*  --  12*  ALKPHOS 94 82  --  89  BILITOT 0.5 0.4  --  0.6  PROT 7.4 6.7  --  6.8  ALBUMIN 3.4* 3.2* 3.1* 3.2*   No results for input(s): LIPASE, AMYLASE in the last 168 hours.  Recent Labs Lab 11/19/15 0927  AMMONIA 31   CBC:  Recent Labs Lab 11/16/15 1150  11/19/15 0926 11/20/15 0033 11/20/15 0554  WBC 4.3  --  7.5 5.3 5.9  NEUTROABS 2.2  --  4.7  --   --   HGB 12.2  < > 11.1* 11.7* 11.8*  HCT 38.3  < > 35.3* 37.0 37.3  MCV 90.8  --  89.6 89.2 89.4  PLT 216  --  209 234 218  < > = values in this interval not displayed. Cardiac Enzymes:  Recent Labs Lab 11/19/15 0926  TROPONINI 0.03   CBG: No results for input(s): GLUCAP in the last 168 hours.  Studies/Results: No results found. Medications: . sodium chloride     . allopurinol  150 mg Oral QHS  . ALPRAZolam  0.25  mg Oral BID  . aspirin  81 mg Oral Daily  . calcium carbonate  500 mg Oral TID  . cefUROXime (ZINACEF)  IV  1.5 g Intravenous To SSTC  . Chlorhexidine Gluconate Cloth  6 each Topical Once  . cinacalcet  30 mg Oral Q breakfast  . heparin  5,000 Units Subcutaneous 3 times per day  . losartan  100 mg Oral Daily  . multivitamin  1 tablet Oral QHS  . simvastatin  10 mg Oral QHS  . sodium chloride  3 mL Intravenous Q12H  . sodium chloride  3 mL Intravenous Q12H  . zolpidem  5 mg Oral QHS

## 2015-11-20 NOTE — Progress Notes (Signed)
Pt was just discharged from hospital today for ? TIA. She states all symptoms have disappeared. She denies any chest pain or sob.

## 2015-11-20 NOTE — Discharge Summary (Signed)
Physician Discharge Summary  [name] [id number] DOB: Jul 23, 1951 DOA: 11/19/2015  PCP: Kandice Hams, MD  Admit date: 11/19/2015 Discharge date: 11/20/2015  Time spent: 35 minutes  Recommendations for Outpatient Follow-up:  1. Patient to resume outpatient hemodialysis Tuesday, Thursday, Saturday schedule 2. Planned [facility]VS to provide new access this week on Friday, 11/22/2015 3. She was set up with home health services for R and and PTT prior to discharge    Discharge Diagnoses:  Active Problems:   ESRD (end stage renal disease) ([place])   HTN (hypertension)   Hyperlipidemia   Stroke-like symptom   Neurologic abnormality   Altered mental state   Hyperkalemia   Anxiety   Insomnia   Discharge Condition: Stable  Diet recommendation: Heart healthy  Filed Weights   11/20/15 0402  Weight: 56.756 kg (125 lb 2 oz)    History of present illness:  [name] is a 64 y.o. female  Patient has been feeling "ill" over the last several days. Constant. Getting worse. This is associated with slurred speech and somewhat odd behavior which started this morning when patient awoke. Symptoms are constant but not getting better or worsens onset. This is associated with an unsteady gait. Patient denies any symptoms of chest pain, shortness of breath, cough, abdominal pain, dysuria, frequency, back pain, fevers. Patient goes to dialysis Tuesday Thursday and Saturday and was due for dialysis today but felt too poorly to go.  Of note patient was discharged from [facility] on 11/17/2015 after workup for left facial numbness. Patient was diagnosed with HSV vestibular neuritis. MRI at that time revealed small vessel disease and widespread areas of chronic microhemorrhages but no evidence of acute stroke. Patient was discharged on steroids and also likely are.  Hospital Course:  [name] is a 64 year old female with a past medical history of end-stage renal disease currently on  hemodialysis Tuesdays, Thursdays, Saturdays, admitted to the medicine service on 11/19/2015 presenting with slurred speech. She was seen and evaluated by neurology. Symptoms resolving < 24 hours. Neurology had initially ordered an MRI however could not be done due to diarrhea after the administration of Kayexalate. On reevaluation the following morning patient reported feeling back to her baseline and was ambulating unassisted. They felt symptoms were likely metabolic in origin and did not recommend further workup. She had a recent MRI done on 11/16/2015 that did not show acute CVA. This had been ordered for further workup of left facial and left upper extremity numbness. He was also evaluated by nephrology during this hospitalization. Given clinical stability she was discharged on 11/20/2015. She was set up with home health services prior to discharge.  Consultations:  Neurology  Nephrology  Discharge Exam: Filed Vitals:   11/20/15 0215 11/20/15 0402  BP: 134/109 144/106  Pulse: 91 89  Temp: 99.1 F (37.3 C)   Resp: 16     General: Patient is in no acute distress awake and alert, she had to lay down the hallway with me in back, seems to be at her baseline. Cardiovascular: Regular rate and rhythm normal S1-S2 Respiratory: Normal respiratory effort Abdomen: Soft nontender nondistended positive bowel sounds Neurologic examination: She did not have evidence of slurred speech or facial droop. On gait evaluation she has a limp on right side. Otherwise had nonfocal neurologic exam.  Discharge Instructions   Discharge Instructions    Call MD for:  difficulty breathing, headache or visual disturbances    Complete by:  As directed  Call MD for:  extreme fatigue    Complete by:  As directed      Call MD for:  hives    Complete by:  As directed      Call MD for:  persistant dizziness or light-headedness    Complete by:  As directed      Call MD for:  persistant nausea and vomiting     Complete by:  As directed      Call MD for:  redness, tenderness, or signs of infection (pain, swelling, redness, odor or green/yellow discharge around incision site)    Complete by:  As directed      Call MD for:  severe uncontrolled pain    Complete by:  As directed      Call MD for:  temperature >100.4    Complete by:  As directed      Call MD for:    Complete by:  As directed      Diet - low sodium heart healthy    Complete by:  As directed      Increase activity slowly    Complete by:  As directed           Current Discharge Medication List    CONTINUE these medications which have NOT CHANGED   Details  acetaminophen (TYLENOL) 325 MG tablet Take 650 mg by mouth every 6 (six) hours as needed for mild pain.    allopurinol (ZYLOPRIM) 300 MG tablet Take 150 mg by mouth at bedtime.    ALPRAZolam (XANAX) 0.25 MG tablet Take 0.25 mg by mouth 2 (two) times daily.     aspirin 81 MG chewable tablet Chew 81 mg by mouth daily.    b complex-vitamin c-folic acid (NEPHRO-VITE) 0.8 MG TABS tablet Take 1 tablet by mouth at bedtime.    calcium carbonate (TUMS) 500 MG chewable tablet Chew 500 mg by mouth 3 (three) times daily.    cinacalcet (SENSIPAR) 30 MG tablet Take 30 mg by mouth daily.    doxazosin (CARDURA) 8 MG tablet Take 1 tablet (8 mg total) by mouth 2 (two) times daily as needed (hypertension).    HYDROcodone-acetaminophen (NORCO) 5-325 MG tablet Take 1 tablet by mouth every 6 (six) hours as needed for moderate pain.    labetalol (NORMODYNE) 300 MG tablet Take 300 mg by mouth 2 (two) times daily.    losartan (COZAAR) 100 MG tablet Take 100 mg by mouth at bedtime.     promethazine (PHENERGAN) 25 MG tablet Take 25 mg by mouth every 6 (six) hours as needed for nausea or vomiting.    simvastatin (ZOCOR) 10 MG tablet Take 10 mg by mouth at bedtime.     sorbitol 70 % solution Take 60 mLs by mouth daily as needed (constipation).     traMADol (ULTRAM) 50 MG tablet Take 50 mg by  mouth 2 (two) times daily.  Refills: 0    zolpidem (AMBIEN) 10 MG tablet Take 10 mg by mouth at bedtime.      STOP taking these medications     aspirin 325 MG tablet      UNABLE TO FIND        Allergies  Allergen Reactions  . Sulfa Antibiotics Other (See Comments)    Both parents allergic-so will not take   Follow-up Information    Follow up with POLITE,RONALD D, MD In 1 week.   Specialty:  Internal Medicine   Contact information:   [address]  [place] [address] [phone]        The results of significant diagnostics from this hospitalization (including imaging, microbiology, ancillary and laboratory) are listed below for reference.    Significant Diagnostic Studies: Ct Head Wo Contrast  11/16/2015  CLINICAL DATA:  Left-sided numbness and weakness. History of hypertension and TIA. Code stroke. Initial encounter. EXAM: CT HEAD WITHOUT CONTRAST TECHNIQUE: Contiguous axial images were obtained from the base of the skull through the vertex without intravenous contrast. COMPARISON:  Head CT 05/09/2015.  MRI brain 05/09/2015. FINDINGS: There is no evidence of acute intracranial hemorrhage, mass lesion, brain edema or extra-axial fluid collection. There is patchy low-density in the periventricular white matter with an asymmetric component in the right frontal region which appears unchanged. Small lacunar infarct within the posterior right corona radiata on image number 17 is unchanged. There is a new small low-density lesion in the right thalamus on image 13. No evidence of acute cortical based infarct. Intracranial vascular calcifications are noted. The visualized paranasal sinuses, mastoid air cells and middle ears are clear. The calvarium is intact. IMPRESSION: 1. New age-indeterminate right thalamic stroke, appearing at least subacute in age. 2. Otherwise stable periventricular white matter disease. No CT evidence of acute cortical stroke or hemorrhage. 3.  These results were called by telephone at the time of interpretation on 11/16/2015 at 12:02 pm to Dr. Silverio Decamp , who verbally acknowledged these results. Electronically Signed   By: Richardean Sale M.D.   On: 11/16/2015 12:06   Mr Brain Wo Contrast  11/16/2015  CLINICAL DATA:  Left-sided facial numbness. End-stage renal disease. History of hypertension. EXAM: MRI HEAD WITHOUT CONTRAST TECHNIQUE: Multiplanar, multiecho pulse sequences of the brain and surrounding structures were obtained without intravenous contrast. COMPARISON:  CT head earlier today FINDINGS: The patient was unable to remain motionless for the exam. Small or subtle lesions could be overlooked. No evidence for acute stroke, acute hemorrhage, mass lesion, hydrocephalus, or extra-axial fluid. Premature for age cerebral and cerebellar atrophy. Extensive T2 and FLAIR hyperintensities throughout the periventricular and subcortical white matter representing small vessel disease. Scattered areas of chronic lacunar infarction affect the BILATERAL thalami, BILATERAL basal ganglia, and periventricular white matter. Flow voids are maintained. There are numerous foci of susceptibility on gradient hemorrhage, throughout the cerebral hemispheres, deep nuclei, cerebellum, and brainstem representing foci of microhemorrhage. Constellation of microbleeds and white matter disease is consistent with longstanding hypertensive cerebrovascular disease. Pituitary and cerebellar tonsils unremarkable. Previous cervical fusion with instrumentation. Adjacent segment disease suspected at C2-C3 with mild stenosis. Extracranial soft tissues show mild chronic sinus disease but no acute findings. The areas questioned on CT earlier today represent chronic lacunar which have developed since the prior CT and MR 05/09/2015. IMPRESSION: Atrophy, small vessel disease, and widespread areas of chronic microhemorrhage consistent with longstanding hypertensive cerebral vascular disease. No  acute intracranial findings. Specifically no acute RIGHT hemisphere stroke. Electronically Signed   By: Staci Righter M.D.   On: 11/16/2015 15:27   Dg Chest Port 1 View  11/16/2015  CLINICAL DATA:  CVA EXAM: PORTABLE CHEST 1 VIEW COMPARISON:  01/22/2015 chest radiograph. FINDINGS: Right internal jugular central venous catheter terminates in the middle third of the superior vena cava. Stable cardiomediastinal silhouette with mild cardiomegaly. No pneumothorax. No pleural effusion. There are nonspecific mild dense reticular opacities in the peripheral upper and basilar right lung, unchanged since 04/01/2013 chest CT angiogram, with an appearance most suggestive of tiny parenchymal calcifications such as from prior barium aspiration or granulomatous infection.  No pulmonary edema. No new focal lung opacity. IMPRESSION: 1. Right internal jugular central venous catheter terminates in the middle third of the SVC. No pneumothorax. 2. Stable mild cardiomegaly without overt pulmonary edema. Electronically Signed   By: Ilona Sorrel M.D.   On: 11/16/2015 19:53    Microbiology: No results found for this or any previous visit (from the past 240 hour(s)).   Labs: Basic Metabolic Panel:  Recent Labs Lab 11/16/15 1150 11/16/15 1200 11/17/15 0236 11/19/15 0926 11/19/15 1856 11/20/15 0033 11/20/15 0554  NA 134* 137 133* 133* 133*  --  135  K 3.5 3.4* 4.6 5.7* 4.7  --  3.8  CL 93* 94* 94* 91* 91*  --  96*  CO2 27  --  24 23 22   --  26  GLUCOSE 85 88 160* 82 88  --  87  BUN 9 11 28* 90* 98*  --  22*  CREATININE 2.54* 2.50* 4.40* 9.24* 9.86* 3.56* 4.24*  CALCIUM 8.4*  --  9.8 8.7* 8.7*  --  8.7*  MG  --   --   --   --   --  2.0  --   PHOS  --   --   --   --  6.3* 3.4  --    Liver Function Tests:  Recent Labs Lab 11/16/15 1150 11/19/15 0926 11/19/15 1856 11/20/15 0554  AST 23 23  --  21  ALT 8* 11*  --  12*  ALKPHOS 94 82  --  89  BILITOT 0.5 0.4  --  0.6  PROT 7.4 6.7  --  6.8  ALBUMIN 3.4*  3.2* 3.1* 3.2*   No results for input(s): LIPASE, AMYLASE in the last 168 hours.  Recent Labs Lab 11/19/15 0927  AMMONIA 31   CBC:  Recent Labs Lab 11/16/15 1150 11/16/15 1200 11/19/15 0926 11/20/15 0033 11/20/15 0554  WBC 4.3  --  7.5 5.3 5.9  NEUTROABS 2.2  --  4.7  --   --   HGB 12.2 14.3 11.1* 11.7* 11.8*  HCT 38.3 42.0 35.3* 37.0 37.3  MCV 90.8  --  89.6 89.2 89.4  PLT 216  --  209 234 218   Cardiac Enzymes:  Recent Labs Lab 11/19/15 0926  TROPONINI 0.03   BNP: BNP (last 3 results)  Recent Labs  01/22/15 0057  BNP 1438.6*    ProBNP (last 3 results) No results for input(s): PROBNP in the last 8760 hours.  CBG: No results for input(s): GLUCAP in the last 168 hours.     SignedKelvin Cellar  [facility] 11/20/2015, 11:28 AM

## 2015-11-20 NOTE — Progress Notes (Signed)
Subjective: Patient was unable to obtain MRI due to diarrhea last night. Today feels back to her baseline. Able to walk in halls unassisted.   Exam: Filed Vitals:   11/20/15 0215 11/20/15 0402  BP: 134/109 144/106  Pulse: 91 89  Temp: 99.1 F (37.3 C)   Resp: 16     HEENT-  Normocephalic, no lesions, without obvious abnormality.  Normal external eye and conjunctiva.  Normal TM's bilaterally.  Normal auditory canals and external ears. Normal external nose, mucus membranes and septum.  Normal pharynx. Cardiovascular- S1, S2 normal, pulses palpable throughout   Lungs- chest clear, no wheezing, rales, normal symmetric air entry Abdomen- normal findings: bowel sounds normal Extremities- no edema Lymph-no adenopathy palpable Musculoskeletal-no joint tenderness, deformity or swelling Skin-warm and dry, no hyperpigmentation, vitiligo, or suspicious lesions    Gen: In bed, NAD MS: alert and oriented, follows all commands. NO dysarthria or aphasia CN: PERRLA, EOMI, TML, FACE AND SMILE SYMMETRIC Motor: moving all extremities antigravity with full strength.  Sensory: fully intact   Pertinent Labs: Cl 96 BUN 22 Cr 4.24  Etta Quill PA-C [facility] [phone]  Impression: Symptoms fully resolved. Likely metabolic as symptoms have resolved after HD.    Recommendations: 1) No further work up recommended.  Neurology S/O    11/20/2015, 9:12 AM

## 2015-11-21 ENCOUNTER — Other Ambulatory Visit: Payer: Self-pay

## 2015-11-21 DIAGNOSIS — D509 Iron deficiency anemia, unspecified: Secondary | ICD-10-CM | POA: Diagnosis not present

## 2015-11-21 DIAGNOSIS — N2581 Secondary hyperparathyroidism of renal origin: Secondary | ICD-10-CM | POA: Diagnosis not present

## 2015-11-21 DIAGNOSIS — N186 End stage renal disease: Secondary | ICD-10-CM | POA: Diagnosis not present

## 2015-11-21 DIAGNOSIS — D631 Anemia in chronic kidney disease: Secondary | ICD-10-CM | POA: Diagnosis not present

## 2015-11-21 MED ORDER — CHLORHEXIDINE GLUCONATE CLOTH 2 % EX PADS: 6.0000 | MEDICATED_PAD | Freq: Once | CUTANEOUS | Status: DC

## 2015-11-21 MED ORDER — SODIUM CHLORIDE 0.9 % IV SOLN
INTRAVENOUS | Status: DC
  Administered 2015-11-22: 08:00:00 via INTRAVENOUS

## 2015-11-21 MED ORDER — DEXTROSE 5 % IV SOLN
1.5000 g | INTRAVENOUS | Status: AC
  Administered 2015-11-22: 1.5 g via INTRAVENOUS
  Filled 2015-11-21: qty 1.5

## 2015-11-21 NOTE — Progress Notes (Signed)
Called Dr. Stephens Shire office and spoke with Arbie Cookey requesting pre-op orders.

## 2015-11-22 ENCOUNTER — Encounter (HOSPITAL_COMMUNITY): Payer: Self-pay | Admitting: *Deleted

## 2015-11-22 ENCOUNTER — Ambulatory Visit (HOSPITAL_COMMUNITY): Payer: Medicare Other | Admitting: Certified Registered"

## 2015-11-22 ENCOUNTER — Encounter (HOSPITAL_COMMUNITY)
Admission: RE | Disposition: A | Discharge status: Home / Self Care | Payer: Self-pay | Source: Ambulatory Visit | Attending: Surgery

## 2015-11-22 ENCOUNTER — Ambulatory Visit (HOSPITAL_COMMUNITY)
Admission: RE | Admit: 2015-11-22 | Discharge: 2015-11-22 | Disposition: A | Discharge status: Home / Self Care | Payer: Medicare Other | Source: Ambulatory Visit | Attending: Surgery | Admitting: Surgery

## 2015-11-22 DIAGNOSIS — K219 Gastro-esophageal reflux disease without esophagitis: Secondary | ICD-10-CM | POA: Insufficient documentation

## 2015-11-22 DIAGNOSIS — D631 Anemia in chronic kidney disease: Secondary | ICD-10-CM | POA: Diagnosis not present

## 2015-11-22 DIAGNOSIS — Z992 Dependence on renal dialysis: Secondary | ICD-10-CM | POA: Diagnosis not present

## 2015-11-22 DIAGNOSIS — Z940 Kidney transplant status: Secondary | ICD-10-CM | POA: Insufficient documentation

## 2015-11-22 DIAGNOSIS — M1990 Unspecified osteoarthritis, unspecified site: Secondary | ICD-10-CM | POA: Diagnosis not present

## 2015-11-22 DIAGNOSIS — T82898A Other specified complication of vascular prosthetic devices, implants and grafts, initial encounter: Secondary | ICD-10-CM | POA: Diagnosis not present

## 2015-11-22 DIAGNOSIS — Z7982 Long term (current) use of aspirin: Secondary | ICD-10-CM | POA: Insufficient documentation

## 2015-11-22 DIAGNOSIS — Y832 Surgical operation with anastomosis, bypass or graft as the cause of abnormal reaction of the patient, or of later complication, without mention of misadventure at the time of the procedure: Secondary | ICD-10-CM | POA: Insufficient documentation

## 2015-11-22 DIAGNOSIS — Z8673 Personal history of transient ischemic attack (TIA), and cerebral infarction without residual deficits: Secondary | ICD-10-CM | POA: Diagnosis not present

## 2015-11-22 DIAGNOSIS — Z87891 Personal history of nicotine dependence: Secondary | ICD-10-CM | POA: Diagnosis not present

## 2015-11-22 DIAGNOSIS — I509 Heart failure, unspecified: Secondary | ICD-10-CM | POA: Diagnosis not present

## 2015-11-22 DIAGNOSIS — E7800 Pure hypercholesterolemia, unspecified: Secondary | ICD-10-CM | POA: Insufficient documentation

## 2015-11-22 DIAGNOSIS — F418 Other specified anxiety disorders: Secondary | ICD-10-CM | POA: Insufficient documentation

## 2015-11-22 DIAGNOSIS — N186 End stage renal disease: Secondary | ICD-10-CM | POA: Diagnosis not present

## 2015-11-22 DIAGNOSIS — Z79899 Other long term (current) drug therapy: Secondary | ICD-10-CM | POA: Diagnosis not present

## 2015-11-22 DIAGNOSIS — I132 Hypertensive heart and chronic kidney disease with heart failure and with stage 5 chronic kidney disease, or end stage renal disease: Secondary | ICD-10-CM | POA: Insufficient documentation

## 2015-11-22 HISTORY — DX: Headache, unspecified: R51.9

## 2015-11-22 HISTORY — DX: Constipation, unspecified: K59.00

## 2015-11-22 HISTORY — DX: Headache: R51

## 2015-11-22 HISTORY — DX: Pneumonia, unspecified organism: J18.9

## 2015-11-22 HISTORY — PX: AV FISTULA PLACEMENT: SHX1204

## 2015-11-22 HISTORY — PX: RESECTION OF ARTERIOVENOUS FISTULA ANEURYSM: SHX6070

## 2015-11-22 HISTORY — DX: Cerebral infarction, unspecified: I63.9

## 2015-11-22 LAB — POCT I-STAT 4, (NA,K, GLUC, HGB,HCT)
GLUCOSE: 83 mg/dL (ref 65–99)
HEMATOCRIT: 35 % — AB (ref 36.0–46.0)
HEMOGLOBIN: 11.9 g/dL — AB (ref 12.0–15.0)
POTASSIUM: 3.5 mmol/L (ref 3.5–5.1)
SODIUM: 137 mmol/L (ref 135–145)

## 2015-11-22 SURGERY — ARTERIOVENOUS (AV) FISTULA CREATION
Anesthesia: Monitor Anesthesia Care | Site: Arm Upper | Laterality: Left

## 2015-11-22 MED ORDER — HYDROCODONE-ACETAMINOPHEN 5-325 MG PO TABS: 1.0000 | ORAL_TABLET | Freq: Four times a day (QID) | ORAL | Status: DC | PRN

## 2015-11-22 MED ORDER — ONDANSETRON HCL 4 MG/2ML IJ SOLN
INTRAMUSCULAR | Status: DC | PRN
  Administered 2015-11-22: 4 mg via INTRAVENOUS

## 2015-11-22 MED ORDER — DOXAZOSIN MESYLATE 8 MG PO TABS
8.0000 mg | ORAL_TABLET | ORAL | Status: AC
  Administered 2015-11-22: 8 mg via ORAL
  Filled 2015-11-22: qty 1

## 2015-11-22 MED ORDER — EPHEDRINE SULFATE 50 MG/ML IJ SOLN
INTRAMUSCULAR | Status: AC
  Filled 2015-11-22: qty 1

## 2015-11-22 MED ORDER — PHENYLEPHRINE HCL 10 MG/ML IJ SOLN
INTRAMUSCULAR | Status: DC | PRN
  Administered 2015-11-22: 80 ug via INTRAVENOUS
  Administered 2015-11-22 (×2): 40 ug via INTRAVENOUS
  Administered 2015-11-22: 80 ug via INTRAVENOUS
  Administered 2015-11-22 (×3): 40 ug via INTRAVENOUS
  Administered 2015-11-22: 80 ug via INTRAVENOUS
  Administered 2015-11-22 (×2): 40 ug via INTRAVENOUS
  Administered 2015-11-22 (×5): 80 ug via INTRAVENOUS
  Administered 2015-11-22: 40 ug via INTRAVENOUS

## 2015-11-22 MED ORDER — EPHEDRINE SULFATE 50 MG/ML IJ SOLN
INTRAMUSCULAR | Status: DC | PRN
  Administered 2015-11-22 (×3): 10 mg via INTRAVENOUS

## 2015-11-22 MED ORDER — PROTAMINE SULFATE 10 MG/ML IV SOLN
INTRAVENOUS | Status: DC | PRN
  Administered 2015-11-22: 15 mg via INTRAVENOUS
  Administered 2015-11-22: 10 mg via INTRAVENOUS
  Administered 2015-11-22: 25 mg via INTRAVENOUS

## 2015-11-22 MED ORDER — HEMOSTATIC AGENTS (NO CHARGE) OPTIME
TOPICAL | Status: DC | PRN
  Administered 2015-11-22: 1 via TOPICAL

## 2015-11-22 MED ORDER — PHENYLEPHRINE 40 MCG/ML (10ML) SYRINGE FOR IV PUSH (FOR BLOOD PRESSURE SUPPORT)
PREFILLED_SYRINGE | INTRAVENOUS | Status: AC
  Filled 2015-11-22: qty 10

## 2015-11-22 MED ORDER — ONDANSETRON HCL 4 MG/2ML IJ SOLN
INTRAMUSCULAR | Status: AC
  Filled 2015-11-22: qty 2

## 2015-11-22 MED ORDER — SODIUM CHLORIDE 0.9 % IV SOLN
INTRAVENOUS | Status: DC | PRN
  Administered 2015-11-22: 10:00:00

## 2015-11-22 MED ORDER — PROPOFOL 10 MG/ML IV BOLUS
INTRAVENOUS | Status: DC | PRN
  Administered 2015-11-22: 10 mg via INTRAVENOUS
  Administered 2015-11-22 (×2): 20 mg via INTRAVENOUS

## 2015-11-22 MED ORDER — PROPOFOL 10 MG/ML IV BOLUS
INTRAVENOUS | Status: AC
  Filled 2015-11-22: qty 20

## 2015-11-22 MED ORDER — LABETALOL HCL 300 MG PO TABS
300.0000 mg | ORAL_TABLET | ORAL | Status: AC
  Administered 2015-11-22: 300 mg via ORAL
  Filled 2015-11-22: qty 1

## 2015-11-22 MED ORDER — FENTANYL CITRATE (PF) 100 MCG/2ML IJ SOLN: 25.0000 ug | INTRAMUSCULAR | Status: DC | PRN

## 2015-11-22 MED ORDER — ALBUMIN HUMAN 5 % IV SOLN
INTRAVENOUS | Status: DC | PRN
  Administered 2015-11-22: 12:00:00 via INTRAVENOUS

## 2015-11-22 MED ORDER — PROPOFOL 500 MG/50ML IV EMUL
INTRAVENOUS | Status: DC | PRN
  Administered 2015-11-22: 100 ug/kg/min via INTRAVENOUS

## 2015-11-22 MED ORDER — HEPARIN SODIUM (PORCINE) 1000 UNIT/ML IJ SOLN
INTRAMUSCULAR | Status: DC | PRN
  Administered 2015-11-22: 2000 [IU] via INTRAVENOUS
  Administered 2015-11-22: 3000 [IU] via INTRAVENOUS

## 2015-11-22 MED ORDER — LIDOCAINE-EPINEPHRINE (PF) 1 %-1:200000 IJ SOLN
INTRAMUSCULAR | Status: DC | PRN
  Administered 2015-11-22: 15 mL

## 2015-11-22 MED ORDER — FENTANYL CITRATE (PF) 100 MCG/2ML IJ SOLN
INTRAMUSCULAR | Status: DC | PRN
  Administered 2015-11-22: 50 ug via INTRAVENOUS

## 2015-11-22 MED ORDER — MIDAZOLAM HCL 2 MG/2ML IJ SOLN
INTRAMUSCULAR | Status: AC
  Filled 2015-11-22: qty 2

## 2015-11-22 MED ORDER — PROMETHAZINE HCL 25 MG/ML IJ SOLN: 6.2500 mg | INTRAMUSCULAR | Status: DC | PRN

## 2015-11-22 MED ORDER — 0.9 % SODIUM CHLORIDE (POUR BTL) OPTIME
TOPICAL | Status: DC | PRN
  Administered 2015-11-22: 1000 mL

## 2015-11-22 MED ORDER — LIDOCAINE-EPINEPHRINE (PF) 1 %-1:200000 IJ SOLN
INTRAMUSCULAR | Status: AC
  Filled 2015-11-22: qty 30

## 2015-11-22 MED ORDER — FENTANYL CITRATE (PF) 250 MCG/5ML IJ SOLN
INTRAMUSCULAR | Status: AC
  Filled 2015-11-22: qty 5

## 2015-11-22 SURGICAL SUPPLY — 36 items
ARMBAND PINK RESTRICT EXTREMIT (MISCELLANEOUS) ×3 IMPLANT
CANISTER SUCTION 2500CC (MISCELLANEOUS) ×3 IMPLANT
CLIP TI MEDIUM 6 (CLIP) ×4 IMPLANT
CLIP TI WIDE RED SMALL 6 (CLIP) ×5 IMPLANT
COVER PROBE W GEL 5X96 (DRAPES) ×3 IMPLANT
ELECT REM PT RETURN 9FT ADLT (ELECTROSURGICAL) ×3
ELECTRODE REM PT RTRN 9FT ADLT (ELECTROSURGICAL) ×2 IMPLANT
GLOVE BIO SURGEON STRL SZ8 (GLOVE) ×1 IMPLANT
GLOVE BIOGEL PI IND STRL 6.5 (GLOVE) IMPLANT
GLOVE BIOGEL PI IND STRL 7.5 (GLOVE) ×2 IMPLANT
GLOVE BIOGEL PI INDICATOR 6.5 (GLOVE) ×2
GLOVE BIOGEL PI INDICATOR 7.5 (GLOVE) ×2
GLOVE ECLIPSE 6.5 STRL STRAW (GLOVE) ×2 IMPLANT
GLOVE ECLIPSE 7.0 STRL STRAW (GLOVE) ×1 IMPLANT
GLOVE SURG SS PI 7.5 STRL IVOR (GLOVE) ×3 IMPLANT
GOWN STRL REUS W/ TWL LRG LVL3 (GOWN DISPOSABLE) ×4 IMPLANT
GOWN STRL REUS W/ TWL XL LVL3 (GOWN DISPOSABLE) ×2 IMPLANT
GOWN STRL REUS W/TWL LRG LVL3 (GOWN DISPOSABLE) ×6
GOWN STRL REUS W/TWL XL LVL3 (GOWN DISPOSABLE) ×3
HEMOSTAT SNOW SURGICEL 2X4 (HEMOSTASIS) ×1 IMPLANT
KIT BASIN OR (CUSTOM PROCEDURE TRAY) ×3 IMPLANT
KIT ROOM TURNOVER OR (KITS) ×3 IMPLANT
LIQUID BAND (GAUZE/BANDAGES/DRESSINGS) ×3 IMPLANT
NS IRRIG 1000ML POUR BTL (IV SOLUTION) ×3 IMPLANT
PACK CV ACCESS (CUSTOM PROCEDURE TRAY) ×3 IMPLANT
PAD ARMBOARD 7.5X6 YLW CONV (MISCELLANEOUS) ×6 IMPLANT
SUT PROLENE 5 0 C 1 24 (SUTURE) ×3 IMPLANT
SUT PROLENE 6 0 CC (SUTURE) ×6 IMPLANT
SUT SILK 3 0 (SUTURE) ×3
SUT SILK 3-0 18XBRD TIE 12 (SUTURE) IMPLANT
SUT VIC AB 3-0 SH 27 (SUTURE) ×6
SUT VIC AB 3-0 SH 27X BRD (SUTURE) ×2 IMPLANT
SUT VIC AB 4-0 PS2 27 (SUTURE) ×1 IMPLANT
SUT VICRYL 4-0 PS2 18IN ABS (SUTURE) IMPLANT
UNDERPAD 30X30 INCONTINENT (UNDERPADS AND DIAPERS) ×3 IMPLANT
WATER STERILE IRR 1000ML POUR (IV SOLUTION) ×2 IMPLANT

## 2015-11-22 NOTE — Anesthesia Postprocedure Evaluation (Signed)
Anesthesia Post Note  Patient: ETHYL GAIN  Procedure(s) Performed: Procedure(s) (LRB): ARTERIOVENOUS (AV) FISTULA CREATION-LEFT BRACHIOCEPHALIC (Left) RESECTION OF LEFT RADIOCEPHALIC FISTULA ANEURYSM  (Left)  Patient location during evaluation: PACU Anesthesia Type: MAC Level of consciousness: awake and alert Pain management: pain level controlled Vital Signs Assessment: post-procedure vital signs reviewed and stable Respiratory status: spontaneous breathing, nonlabored ventilation and respiratory function stable Cardiovascular status: stable and blood pressure returned to baseline Anesthetic complications: no    Last Vitals:  Filed Vitals:   11/22/15 1315 11/22/15 1320  BP: 118/77 132/85  Pulse: 69 69  Temp:    Resp:  18    Last Pain:  Filed Vitals:   11/22/15 1321  PainSc: 0-No pain                 Aundra Pung,W. EDMOND

## 2015-11-22 NOTE — Anesthesia Preprocedure Evaluation (Addendum)
Anesthesia Evaluation  Patient identified by MRN, date of birth, ID band Patient awake    Reviewed: Allergy & Precautions, NPO status , Patient's Chart, lab work & pertinent test results, reviewed documented beta blocker date and time   Airway Mallampati: II  TM Distance: >3 FB     Dental   Pulmonary former smoker,    breath sounds clear to auscultation       Cardiovascular hypertension, Pt. on medications and Pt. on home beta blockers +CHF   Rhythm:Regular Rate:Normal     Neuro/Psych Anxiety Depression CVA    GI/Hepatic Neg liver ROS, GERD  ,  Endo/Other  negative endocrine ROS  Renal/GU ESRF and DialysisRenal disease     Musculoskeletal  (+) Arthritis ,   Abdominal   Peds  Hematology  (+) anemia ,   Anesthesia Other Findings   Reproductive/Obstetrics                            Anesthesia Physical Anesthesia Plan  ASA: III  Anesthesia Plan: MAC   Post-op Pain Management:    Induction: Intravenous  Airway Management Planned: Natural Airway, Simple Face Mask and Nasal Cannula  Additional Equipment:   Intra-op Plan:   Post-operative Plan:   Informed Consent: I have reviewed the patients History and Physical, chart, labs and discussed the procedure including the risks, benefits and alternatives for the proposed anesthesia with the patient or authorized representative who has indicated his/her understanding and acceptance.     Plan Discussed with: CRNA, Anesthesiologist and Surgeon  Anesthesia Plan Comments:        Anesthesia Quick Evaluation

## 2015-11-22 NOTE — Op Note (Signed)
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  Once this was fully dissected free, I felt the best option for repair was a primary end to end anastomosis.  The radial artery appeared to occlude at the level of the anastomosis.  There was good backbleeding from the distal radial artery  The patient had an excellent cephalic vein at the antecubital crease.  She did have an ectatic brachial artery.  There were excellent Doppler signals after the case  Indications:  The patient has a history of a left radiocephalic fistula for dialysis 15 years ago which has occluded.  She is here today for brachiocephalic fistula placement.  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  We discussed performing the fistula and fixing the aneurysm  Procedure:  The patient was identified in the holding area and taken to [place] 16  The patient was then placed supine on the table. MAC anesthesia was administered.  The patient was prepped and draped in the usual sterile fashion.  A time out was called and antibiotics were administered.  The cephalic vein is easily visualized without ultrasound.  1% lidocaine was used for local anesthesia.  A made a transverse incision at the antecubital crease.  I dissected out approximately a 5 mm cephalic vein throughout the width of the incision.  I marked it with a pen for orientation.  I then dissected out the brachial artery.  This was aneurysmal measuring approximately 1.2  cm.  Once I had adequate exposure I gave 3000 units of heparin.  I ligated the vein distally.  It was spatulated.  The artery was occluded.  A 11 blade was used to make an arteriotomy which was extended longitudinally with Potts scissors.  A running anastomosis was created with 6-0 Prolene.  Prior to completion, the appropriate flushing maneuvers were performed and the anastomosis was completed.  There was excellent thrill within the fistula.  I dissected out the tract under the skin to make sure there were no kinks or additional branches.  This wound was then packed with dry gauze.  I then made a longitudinal incision after anesthetizing the skin with 1% lidocaine over the radiocephalic fistula aneurysm.  Cautery and sharp dissection were used to dissect out the aneurysm.  The area of the anastomosis was the aneurysmal component.  This involved both the radial artery as well as the cephalic vein.  It was somewhat tedious taking this scar tissue down but ultimately I exposed the radial artery proximal and distal to the anastomosis as well as the cephalic vein for approximately 5 cm.  I ligated the cephalic vein proximally and oversewed the end with 5-0 Prolene in 2 layers.  This however was already occluded.  I then used Metzenbaum scissors to cut out the aneurysm which did involve the anastomosis as well as the native radial artery.  I felt the best option for reconstruction was complete resection of the aneurysm and primary anastomosis.  It did appear that the radial artery occluded at the  level of the anastomosis I spatulated both ends of the radial artery there was somewhat of a size mismatch.  A running end to end anastomosis was created.  There was good backbleeding from the radial artery, suggesting adequate collateralization from the ulnar artery and palmar arch.  Once the anastomosis was completed there was excellent Doppler signal in the radial artery.  There continued to be a palpable thrill within the  fistula.  I did reverse the heparin with protamine.  Once hemostasis was satisfactory the incisions were closed with 2 layers of Vicryl followed by Dermabond.  There were no immediate complications   Disposition:  To PACU in stable condition.   Theotis Burrow, M.D. [facility] Office: [phone] Pager:  [phone]

## 2015-11-22 NOTE — Transfer of Care (Signed)
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

## 2015-11-22 NOTE — Progress Notes (Signed)
Dr. Ola Spurr called to make aware of elevated blood pressure, okay to give Labetolol now and may give Cardura if blood pressure does not decrease per Dr. Deatra Canter. Awaiting meds to arrive from pharmacy.

## 2015-11-22 NOTE — Interval H&P Note (Signed)
History and Physical Interval Note:  11/22/2015 8:36 AM  [name]  has presented today for surgery, with the diagnosis of End Stage Renal Disease  N18.6  The various methods of treatment have been discussed with the patient and family. After consideration of risks, benefits and other options for treatment, the patient has consented to  Procedure(s): ARTERIOVENOUS (AV) FISTULA CREATION-LEFT BRACHIOCEPHALIC (Left) as a surgical intervention .  The patient's history has been reviewed, patient examined, no change in status, stable for surgery.  I have reviewed the patient's chart and labs.  Questions were answered to the patient's satisfaction.     Annamarie Major

## 2015-11-22 NOTE — H&P (View-Only) (Signed)
Patient name: [name] MRN: [id number] DOB: 01/13/1951 Sex: female   Referred by: Dr. Joelyn Oms  Reason for referral:  Chief Complaint  Patient presents with  . New Evaluation    Ref. by Dr.Sanford  C/O  Left wrist clot/ needs new access.   Left wrist pain level 7-8 and swollen for 3 week. Pt is taking Doxycycline  100mg  bid    HISTORY OF PRESENT ILLNESS: This is a 64 year old female who comes in today for new dialysis access.  She is on hemodialysis on Tuesday Thursday Saturday via a right-sided catheter.  She is right-handed.  The patient's initial left radiocephalic fistula was placed around 1991.  She underwent kidney transplantation in 1996.  She was required to restart dialysis in 2012 when her renal transplant failed.  At that time she was able to continue using her left radiocephalic fistula.  Recently, this went on to occlude and she is here for new access.  She has a right-sided catheter.  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  She is a nonsmoker.  She suffers from hypercholesterolemia and takes a statin.  Past Medical History  Diagnosis Date  . Anxiety   . Blood transfusion   . Depression   . GERD (gastroesophageal reflux disease)   . Hyperlipidemia   . Hypertension   . Neuromuscular disorder (HCC)     neuropathy hand and legs  . Weight loss, unintentional   . Adenomatous polyp of colon 10/2010  . Diverticula, colon   . CHF (congestive heart failure) ([place])   . 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  Had deceased donor kidney transplant in 1996.  Had some early rejection then stable function for years, then had slow decline of function and went back on hemodialysis in 2012.  Gets HD TTS schedule at [facility] on [facility] still using L forearm AVF.     Marland Kitchen Anemia in CKD (chronic kidney disease) 11/07/2012  . Arthritis   .  Osteoporosis     Past Surgical History  Procedure Laterality Date  . Kidney transplant  1996  . Back surgery    . Cervical fusion    . Av fistula placement      for dialysis  . Cholecystectomy  12/02/2012    Procedure: LAPAROSCOPIC CHOLECYSTECTOMY WITH INTRAOPERATIVE CHOLANGIOGRAM;  Surgeon: Edward Jolly, MD;  Location: [facility] OR;  Service: General;  Laterality: N/A;    Social History   Social History  . Marital Status: Widowed    Spouse Name: N/A  . Number of Children: 2  . Years of Education: N/A   Occupational History  . Not on file.   Social History Main Topics  . Smoking status: Former Smoker    Types: Cigarettes    Quit date: 12/31/1991  . Smokeless tobacco: Never Used  . Alcohol Use: No  . Drug Use: No  . Sexual Activity: No   Other Topics Concern  . Not on file   Social History Narrative    Family History  Problem Relation Age of Onset  . Colon cancer Brother   . Cancer Brother   . Coronary artery disease Mother 1  . Hyperlipidemia Mother   . Hypertension Mother   . Esophageal cancer Neg Hx   . Stomach cancer Neg Hx   . Rectal cancer Neg Hx     Allergies as of  10/28/2015 - Review Complete 10/28/2015  Allergen Reaction Noted  . Sulfa antibiotics Other (See Comments) 11/06/2012    Current Outpatient Prescriptions on File Prior to Visit  Medication Sig Dispense Refill  . allopurinol (ZYLOPRIM) 300 MG tablet Take 150 mg by mouth at bedtime.    . ALPRAZolam (XANAX) 0.25 MG tablet Take 0.25 mg by mouth daily as needed for anxiety.    Marland Kitchen aspirin 325 MG tablet Take 1 tablet (325 mg total) by mouth daily. 30 tablet 0  . b complex-vitamin c-folic acid (NEPHRO-VITE) 0.8 MG TABS tablet Take 1 tablet by mouth at bedtime.    . calcium carbonate (TUMS - DOSED IN MG ELEMENTAL CALCIUM) 500 MG chewable tablet Chew 1 tablet by mouth 3 (three) times daily as needed. For acid    . cinacalcet (SENSIPAR) 30 MG tablet Take 30 mg by mouth daily.    Marland Kitchen doxazosin  (CARDURA) 8 MG tablet Take 1 tablet (8 mg total) by mouth 2 (two) times daily. (Patient taking differently: Take 8 mg by mouth 2 (two) times daily as needed (hypertension). ) 60 tablet 1  . HYDROcodone-acetaminophen (NORCO) 5-325 MG per tablet Take 1 tablet by mouth every 4 (four) hours as needed for moderate pain. (Patient taking differently: Take 1 tablet by mouth as needed for moderate pain. ) 20 tablet 0  . labetalol (NORMODYNE) 300 MG tablet Take 300 mg by mouth 2 (two) times daily.    Marland Kitchen losartan (COZAAR) 100 MG tablet Take 100 mg by mouth at bedtime.     . promethazine (PHENERGAN) 25 MG tablet Take 25 mg by mouth every 6 (six) hours as needed for nausea or vomiting.    . simvastatin (ZOCOR) 10 MG tablet Take 10 mg by mouth at bedtime.     . sorbitol 70 % solution Take 60 mLs by mouth daily as needed (constipation).     Marland Kitchen zolpidem (AMBIEN) 10 MG tablet Take 10 mg by mouth at bedtime.     No current facility-administered medications on file prior to visit.     REVIEW OF SYSTEMS: Cardiovascular: No chest pain, chest pressure, palpitations, orthopnea, or dyspnea on exertion. No claudication or rest pain,  No history of DVT or phlebitis. Pulmonary: No productive cough, asthma or wheezing. Neurologic: No weakness, paresthesias, aphasia, or amaurosis. No dizziness. Hematologic: No bleeding problems or clotting disorders. Musculoskeletal: No joint pain or joint swelling. Gastrointestinal: No blood in stool or hematemesis Genitourinary: No dysuria or hematuria. Psychiatric:: No history of major depression. Integumentary: No rashes or ulcers. Constitutional: No fever or chills.  PHYSICAL EXAMINATION:  Filed Vitals:   10/28/15 1502 10/28/15 1515  BP: 211/120 206/124  Pulse: 62 65  Temp: 97.8 F (36.6 C)   TempSrc: Oral   Resp: 16   Height: 5\' 2"  (1.575 m)   Weight: 132 lb (59.875 kg)   SpO2: 100%    Body mass index is 24.14 kg/(m^2). General: The patient appears their stated age.     HEENT:  No gross abnormalities Pulmonary: Respirations are non-labored Musculoskeletal: There are no major deformities.   Neurologic: No focal weakness or paresthesias are detected, Skin: There are no ulcer or rashes noted. Psychiatric: The patient has normal affect. Cardiovascular: Occluded left radiocephalic aneurysmal fistula Palpable brachial pulse Diagnostic Studies: I have reviewed a old fistulogram from interventional radiology which shows no significant central venous stenosis and a relatively straight upper arm cephalic vein.  The patient had venous evaluation today which shows an excellent caliber cephalic vein in  the left measuring for the most part greater than 0.6 cm.  She has triphasic arterial studies.  *  Assessment:  End-stage renal disease Plan: I think the patient has an excellent cephalic vein in the upper arm to proceed with a left brachiocephalic fistula.  The risks and benefits of the procedure were discussed with the patient.  All of her questions were answered.  This will be scheduled in the immediate future.     Eldridge Abrahams, M.D. [facility] Office: [phone] Pager:  [phone]

## 2015-11-23 DIAGNOSIS — D509 Iron deficiency anemia, unspecified: Secondary | ICD-10-CM | POA: Diagnosis not present

## 2015-11-23 DIAGNOSIS — N186 End stage renal disease: Secondary | ICD-10-CM | POA: Diagnosis not present

## 2015-11-23 DIAGNOSIS — D631 Anemia in chronic kidney disease: Secondary | ICD-10-CM | POA: Diagnosis not present

## 2015-11-23 DIAGNOSIS — N2581 Secondary hyperparathyroidism of renal origin: Secondary | ICD-10-CM | POA: Diagnosis not present

## 2015-11-24 LAB — CULTURE, BLOOD (ROUTINE X 2)
Culture: NO GROWTH
Culture: NO GROWTH

## 2015-11-25 ENCOUNTER — Other Ambulatory Visit: Payer: Self-pay

## 2015-11-25 ENCOUNTER — Encounter (HOSPITAL_COMMUNITY): Payer: Self-pay | Admitting: Surgery

## 2015-11-25 DIAGNOSIS — N186 End stage renal disease: Secondary | ICD-10-CM

## 2015-11-25 DIAGNOSIS — Z48812 Encounter for surgical aftercare following surgery on the circulatory system: Secondary | ICD-10-CM

## 2015-11-25 DIAGNOSIS — Z992 Dependence on renal dialysis: Secondary | ICD-10-CM

## 2015-11-26 DIAGNOSIS — D509 Iron deficiency anemia, unspecified: Secondary | ICD-10-CM | POA: Diagnosis not present

## 2015-11-26 DIAGNOSIS — N186 End stage renal disease: Secondary | ICD-10-CM | POA: Diagnosis not present

## 2015-11-26 DIAGNOSIS — N2581 Secondary hyperparathyroidism of renal origin: Secondary | ICD-10-CM | POA: Diagnosis not present

## 2015-11-26 DIAGNOSIS — D631 Anemia in chronic kidney disease: Secondary | ICD-10-CM | POA: Diagnosis not present

## 2015-11-27 ENCOUNTER — Ambulatory Visit: Payer: Medicare Other | Attending: Internal Medicine | Admitting: Physical Therapy

## 2015-11-27 DIAGNOSIS — R269 Unspecified abnormalities of gait and mobility: Secondary | ICD-10-CM | POA: Diagnosis not present

## 2015-11-27 DIAGNOSIS — R29898 Other symptoms and signs involving the musculoskeletal system: Secondary | ICD-10-CM

## 2015-11-28 ENCOUNTER — Telehealth: Payer: Self-pay | Admitting: Surgery

## 2015-11-28 ENCOUNTER — Telehealth: Payer: Self-pay

## 2015-11-28 DIAGNOSIS — N186 End stage renal disease: Secondary | ICD-10-CM | POA: Diagnosis not present

## 2015-11-28 DIAGNOSIS — D509 Iron deficiency anemia, unspecified: Secondary | ICD-10-CM | POA: Diagnosis not present

## 2015-11-28 DIAGNOSIS — N2581 Secondary hyperparathyroidism of renal origin: Secondary | ICD-10-CM | POA: Diagnosis not present

## 2015-11-28 DIAGNOSIS — D631 Anemia in chronic kidney disease: Secondary | ICD-10-CM | POA: Diagnosis not present

## 2015-11-28 NOTE — Telephone Encounter (Signed)
Spoke with pt, dpm °

## 2015-11-28 NOTE — Telephone Encounter (Signed)
-----   Message from Denman George, RN sent at 11/25/2015  1:37 PM EST ----- Regarding: Change to a 2 week ov with [facility]   ----- Message -----    From: Dario Ave    Sent: 11/22/2015   3:38 PM      To: Dario Ave, [facility] Charge Pool Subject: Kay's log                                        ----- Message -----    From: Serafina Mitchell, MD    Sent: 11/22/2015   2:13 PM      To: [facility] Charge Pool  11/22/2015:  Surgeon:  Annamarie Major Assistants:  Gerri Lins Procedure:   #1: Resection of left radial artery aneurysm with primary anastomosis   #2: Left brachiocephalic fistula   Follow-up 2 weeks

## 2015-11-28 NOTE — Telephone Encounter (Signed)
Phone call from pt.  Reported she has large amt. bruising on inner aspect of right thigh.  Stated area of bruising is non-raised.  Denied pain or swelling.  Denied any injury to right LE. Stated "I had a lot of pain in the right leg when I had my stroke, and I have trouble lifting it up."  Advised pt. To report bruising and difficulty lifting right leg to her PCP.  Pt. agreed.

## 2015-11-28 NOTE — Therapy (Signed)
[facility] [address] [place], [place], [address] Phone: [phone]   Fax:  [phone]  Physical Therapy Evaluation  Patient Details  Name: [name] MRN: [id number] Date of Birth: [birth year] Referring Provider: Dr. Nehemiah Settle  Encounter Date: 11/27/2015      PT End of Session - 11/28/15 2119    Visit Number 1   Number of Visits 9   Date for PT Re-Evaluation 01/26/16   Authorization Type Medicare-G-code every 10th visit   PT Start Time 1535   PT Stop Time 1616   PT Time Calculation (min) 41 min   Equipment Utilized During Treatment Gait belt   Activity Tolerance Patient tolerated treatment well   Behavior During Therapy [facility] for tasks assessed/performed      Past Medical History  Diagnosis Date  . Anxiety   . Blood transfusion   . Depression   . GERD (gastroesophageal reflux disease)   . Hyperlipidemia   . Hypertension   . Neuromuscular disorder (HCC)     neuropathy hand and legs  . Weight loss, unintentional   . Adenomatous polyp of colon 10/2010  . Diverticula, colon   . CHF (congestive heart failure) (HCC)   . 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  Had deceased donor kidney transplant in 1996.  Had some early rejection then stable function for years, then had slow decline of function and went back on hemodialysis in 2012.  Gets HD TTS schedule at [facility] on [facility] still using L forearm AVF.     Marland Kitchen Anemia in CKD (chronic kidney disease) 11/07/2012  . Arthritis   . Osteoporosis   . Stroke [facility])     TIA  . Pneumonia   . Headache   . Constipation     Past Surgical History  Procedure Laterality Date  . Kidney transplant  1996  . Back surgery    . Cervical fusion    . Av fistula placement      for dialysis  . Cholecystectomy  12/02/2012    Procedure: LAPAROSCOPIC CHOLECYSTECTOMY WITH  INTRAOPERATIVE CHOLANGIOGRAM;  Surgeon: Mariella Saa, MD;  Location: [facility] OR;  Service: General;  Laterality: N/A;  . Eye surgery Bilateral     cataract surgery  . Av fistula placement Left 11/22/2015    Procedure: ARTERIOVENOUS (AV) FISTULA CREATION-LEFT BRACHIOCEPHALIC;  Surgeon: Nada Libman, MD;  Location: [facility] OR;  Service: Vascular;  Laterality: Left;  . Resection of arteriovenous fistula aneurysm Left 11/22/2015    Procedure: RESECTION OF LEFT RADIOCEPHALIC FISTULA ANEURYSM ;  Surgeon: Nada Libman, MD;  Location: [facility] OR;  Service: Vascular;  Laterality: Left;    There were no vitals filed for this visit.  Visit Diagnosis:  Abnormality of gait  Weakness of both lower extremities      Subjective Assessment - 11/27/15 1539    Subjective Pt is a 64 year female who presents to OP PT status post CVA 11/16/15.  Pt reports numbness and tingling in L side, but is also noticing increased R weakness.  No falls reported.  She is not using assistive device.   Pertinent History Pt reported past TIAs   Patient Stated Goals Pt's goal for therapy is to be able to maneuver legs independently.   Currently in Pain? Yes   Pain Score 5    Pain Location Leg   Pain Orientation Right  R  inner thigh   Pain Descriptors / Indicators Aching   Pain Type Acute pain   Pain Onset 1 to 4 weeks ago   Pain Frequency Intermittent   Aggravating Factors  lifting R leg   Pain Relieving Factors resting, not moving            [facility] PT Assessment - 11/27/15 1548    Assessment   Medical Diagnosis CVA   Referring Provider Dr. Nehemiah Settle   Onset Date/Surgical Date 11/16/15   Precautions   Precautions Fall  No BP LUE, dialysis T, Th, S   Balance Screen   Has the patient fallen in the past 6 months No   Has the patient had a decrease in activity level because of a fear of falling?  No   Is the patient reluctant to leave their home because of a fear of falling?  No   Home Environment   Living Environment  Private residence   Living Arrangements Alone   Available Help at Discharge --  Has tenant upstairs in home   Type of Home House   Home Access Stairs to enter   Entrance Stairs-Number of Steps 2   Entrance Stairs-Rails Right;Left   Home Layout Two level   Home Equipment [place] - single point   Prior Function   Level of Independence Independent with basic ADLs;Independent with household mobility without device;Independent with gait;Independent with community mobility without device   ROM / Strength   AROM / PROM / Strength Strength   Strength   Strength Assessment Site Hip;Knee;Ankle   Right/Left Hip Right;Left   Right Hip Flexion 3-/5  compensates with posterior trunk lean   Left Hip Flexion 4/5   Right/Left Knee Right;Left   Right Knee Extension 3-/5  posterior trunk lean   Left Knee Flexion 3+/5   Left Knee Extension 3+/5   Right/Left Ankle Right;Left   Right Ankle Dorsiflexion 4/5   Left Ankle Dorsiflexion 4+/5   Transfers   Transfers Sit to Stand;Stand to Sit   Sit to Stand 6: Modified independent (Device/Increase time);With upper extremity assist;From chair/3-in-1   Stand to Sit 6: Modified independent (Device/Increase time);To chair/3-in-1   Ambulation/Gait   Ambulation/Gait Yes   Ambulation/Gait Assistance 4: Min guard;5: Supervision   Ambulation/Gait Assistance Details Pt has narrow base of support with posterior lean and LOB with turns.   Ambulation Distance (Feet) 120 Feet   Assistive device None   Gait Pattern Step-through pattern;Decreased arm swing - right;Decreased arm swing - left;Decreased step length - right;Decreased step length - left  Pt holds L arm in flexion close to body   Ambulation Surface Level;Indoor   Gait velocity 17.57 sec= 1.86 ft/sec   Standardized Balance Assessment   Standardized Balance Assessment Timed Up and Go Test;Dynamic Gait Index   Dynamic Gait Index   Level Surface Mild Impairment   Change in Gait Speed Moderate Impairment   Gait  with Horizontal Head Turns Moderate Impairment   Gait with Vertical Head Turns Severe Impairment   Gait and Pivot Turn Moderate Impairment  5.29 sec posterior lean with turn   Step Over Obstacle Severe Impairment   Step Around Obstacles Moderate Impairment   Steps Moderate Impairment   Total Score 7   DGI comment: Scores <19/24 indicate increased fall risk   Timed Up and Go Test   Normal TUG (seconds) 15.8  decr. foot clearance with turns  PT Education - 11/28/15 2117-11-16    Education provided Yes   Education Details CVA education-CVA warning signs/symptoms, HTN warning signs   Person(s) Educated Patient   Methods Explanation   Comprehension Verbalized understanding             PT Long Term Goals - 11/28/15 11-16-2133    PT LONG TERM GOAL #1   Title Pt will be independent with HEP for improved balance, gait and transfers.  TARGET 12/27/15   Time 4   Period Weeks   Status New   PT LONG TERM GOAL #2   Title Pt will improve TUG score to less than or equal to 13.5 seconds for decreased fall risk.   Time 4   Period Weeks   Status New   PT LONG TERM GOAL #3   Title Pt will improve gait velocity to at least 2.3 ft/sec for improved gait efficiency and safety.   Time 4   Period Weeks   Status New   PT LONG TERM GOAL #4   Title Pt will improve Dynamic Gait Index score to less than or equal to 12/24 for decreased fall risk.   Time 4   Status New   PT LONG TERM GOAL #5   Title Pt will verbalize understanding of CVA education.   Time 4   Period Weeks   Status New               Plan - 11/28/15 2119/11/17    Clinical Impression Statement Pt is a 64 year old female who presents to OP PT status post several recent episodes of TIA vs CVA.  She reports balance issues as well as R sided weakness.  She has history of dialysis 3x/wk.  She presents to PT eval with decreased balance, increased fall risk per DGI and TUG scores, decreased gait  velocity, decreased strength.  Pt will benefit from skilled PT to address strength, balance and gait for improved functional mobility and decreased fall risk.   Pt will benefit from skilled therapeutic intervention in order to improve on the following deficits Abnormal gait;Decreased activity tolerance;Decreased balance;Decreased mobility;Decreased strength;Difficulty walking   Rehab Potential Good   Clinical Impairments Affecting Rehab Potential Pt on dialysis 3x/wk   PT Frequency 2x / week   PT Duration 4 weeks  plus eval   PT Treatment/Interventions ADLs/Self Care Home Management;Therapeutic exercise;Therapeutic activities;Functional mobility training;Gait training;DME Instruction;Balance training;Neuromuscular re-education;Patient/family education   PT Next Visit Plan Review CVA education, HEP initiated for lower extremity strengthening and balance; may want to try OTAGO   Consulted and Agree with Plan of Care Patient          G-Codes - 12-11-15 11-16-2145    Functional Assessment Tool Used TUG 15.81 sec, gait velocity 1.86 ft/sec, DGI 7/24   Functional Limitation Mobility: Walking and moving around   Mobility: Walking and Moving Around Current Status [phone]) At least 20 percent but less than 40 percent impaired, limited or restricted   Mobility: Walking and Moving Around Goal Status [phone]) At least 1 percent but less than 20 percent impaired, limited or restricted       Problem List Patient Active Problem List   Diagnosis Date Noted  . Neurologic abnormality 11/19/2015  . Altered mental state 11/19/2015  . Hyperkalemia 11/19/2015  . Anxiety 11/19/2015  . Insomnia 11/19/2015  . ESRD (end stage renal disease) on dialysis (HCC)   . Gait instability   . Stroke-like symptom 11/16/2015  . Vestibular neuritis 11/16/2015  .  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 Dizziness 05/09/2015  . Ataxia 05/09/2015  . H/O: CVA (cerebrovascular accident) 05/09/2015  . Left facial numbness  05/09/2015  . Left leg numbness 05/09/2015  . Hyperlipidemia   . CHF (congestive heart failure) (HCC)   . Congestive heart disease (HCC)   . Shortness of breath 04/01/2013  . Volume overload 04/01/2013  . HTN (hypertension) 04/01/2013  . Cholecystitis, acute 11/07/2012  . ESRD (end stage renal disease) (HCC) 11/07/2012  . GERD (gastroesophageal reflux disease) 11/07/2012  . Anemia in CKD (chronic kidney disease) 11/07/2012    Doriana Mazurkiewicz W. 11/28/2015, 9:49 PM  Gean Maidens., PT  [place] [facility] [address] [place], [place], [address] Phone: [phone]   Fax:  [phone]  Name: JOY BACHER MRN: [id number] Date of Birth: 1951-08-14

## 2015-11-30 DIAGNOSIS — N186 End stage renal disease: Secondary | ICD-10-CM | POA: Diagnosis not present

## 2015-11-30 DIAGNOSIS — D509 Iron deficiency anemia, unspecified: Secondary | ICD-10-CM | POA: Diagnosis not present

## 2015-11-30 DIAGNOSIS — D631 Anemia in chronic kidney disease: Secondary | ICD-10-CM | POA: Diagnosis not present

## 2015-11-30 DIAGNOSIS — N2581 Secondary hyperparathyroidism of renal origin: Secondary | ICD-10-CM | POA: Diagnosis not present

## 2015-12-02 DIAGNOSIS — N186 End stage renal disease: Secondary | ICD-10-CM | POA: Diagnosis not present

## 2015-12-02 DIAGNOSIS — E782 Mixed hyperlipidemia: Secondary | ICD-10-CM | POA: Diagnosis not present

## 2015-12-02 DIAGNOSIS — I10 Essential (primary) hypertension: Secondary | ICD-10-CM | POA: Diagnosis not present

## 2015-12-02 DIAGNOSIS — D638 Anemia in other chronic diseases classified elsewhere: Secondary | ICD-10-CM | POA: Diagnosis not present

## 2015-12-02 DIAGNOSIS — M8589 Other specified disorders of bone density and structure, multiple sites: Secondary | ICD-10-CM | POA: Diagnosis not present

## 2015-12-02 DIAGNOSIS — I5020 Unspecified systolic (congestive) heart failure: Secondary | ICD-10-CM | POA: Diagnosis not present

## 2015-12-02 DIAGNOSIS — B009 Herpesviral infection, unspecified: Secondary | ICD-10-CM | POA: Diagnosis not present

## 2015-12-03 DIAGNOSIS — D509 Iron deficiency anemia, unspecified: Secondary | ICD-10-CM | POA: Diagnosis not present

## 2015-12-03 DIAGNOSIS — D631 Anemia in chronic kidney disease: Secondary | ICD-10-CM | POA: Diagnosis not present

## 2015-12-03 DIAGNOSIS — N186 End stage renal disease: Secondary | ICD-10-CM | POA: Diagnosis not present

## 2015-12-03 DIAGNOSIS — N2581 Secondary hyperparathyroidism of renal origin: Secondary | ICD-10-CM | POA: Diagnosis not present

## 2015-12-04 ENCOUNTER — Ambulatory Visit: Payer: Medicare Other | Admitting: Physical Therapy

## 2015-12-04 VITALS — BP 143/77 | HR 94

## 2015-12-04 DIAGNOSIS — R29898 Other symptoms and signs involving the musculoskeletal system: Secondary | ICD-10-CM | POA: Diagnosis not present

## 2015-12-04 DIAGNOSIS — R269 Unspecified abnormalities of gait and mobility: Secondary | ICD-10-CM | POA: Diagnosis not present

## 2015-12-04 NOTE — Therapy (Signed)
[facility] [address] [place], [place], [address] Phone: [phone]   Fax:  [phone]  Physical Therapy Treatment  Patient Details  Name: [name] MRN: [id number] Date of Birth: 1951/09/02 Referring Provider: Dr. Delfina Redwood  Encounter Date: 12/04/2015      PT End of Session - 12/04/15 1538    Visit Number 2   Number of Visits 9   Date for PT Re-Evaluation 01/26/16   Authorization Type Medicare-G-code every 10th visit   PT Start Time 1406   PT Stop Time 1446   PT Time Calculation (min) 40 min   Equipment Utilized During Treatment Gait belt   Activity Tolerance Patient tolerated treatment well   Behavior During Therapy [facility] for tasks assessed/performed      Past Medical History  Diagnosis Date  . Anxiety   . Blood transfusion   . Depression   . GERD (gastroesophageal reflux disease)   . Hyperlipidemia   . Hypertension   . Neuromuscular disorder (HCC)     neuropathy hand and legs  . Weight loss, unintentional   . Adenomatous polyp of colon 10/2010  . Diverticula, colon   . CHF (congestive heart failure) ([place])   . 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  Had deceased donor kidney transplant in 1996.  Had some early rejection then stable function for years, then had slow decline of function and went back on hemodialysis in 2012.  Gets HD TTS schedule at [facility] on [facility] still using L forearm AVF.     Marland Kitchen Anemia in CKD (chronic kidney disease) 11/07/2012  . Arthritis   . Osteoporosis   . Stroke [facility])     TIA  . Pneumonia   . Headache   . Constipation     Past Surgical History  Procedure Laterality Date  . Kidney transplant  1996  . Back surgery    . Cervical fusion    . Av fistula placement      for dialysis  . Cholecystectomy  12/02/2012    Procedure: LAPAROSCOPIC CHOLECYSTECTOMY WITH INTRAOPERATIVE  CHOLANGIOGRAM;  Surgeon: Edward Jolly, MD;  Location: [facility];  Service: General;  Laterality: N/A;  . Eye surgery Bilateral     cataract surgery  . Av fistula placement Left 11/22/2015    Procedure: ARTERIOVENOUS (AV) FISTULA CREATION-LEFT BRACHIOCEPHALIC;  Surgeon: Serafina Mitchell, MD;  Location: [facility] OR;  Service: Vascular;  Laterality: Left;  . Resection of arteriovenous fistula aneurysm Left 11/22/2015    Procedure: RESECTION OF LEFT RADIOCEPHALIC FISTULA ANEURYSM ;  Surgeon: Serafina Mitchell, MD;  Location: [facility] OR;  Service: Vascular;  Laterality: Left;    Filed Vitals:   12/04/15 1411  BP: 143/77  Pulse: 94    Visit Diagnosis:  Abnormality of gait      Subjective Assessment - 12/04/15 1411    Subjective Pt had AV fistula placed 11/22/15 in L UE and has had some pain and swelling but MD has examined.  Denies falls or other changes.  Came in using cane today.   Pertinent History Pt reported past TIAs   Patient Stated Goals Pt's goal for therapy is to be able to maneuver legs independently.   Currently in Pain? Yes   Pain Score 7    Pain Location Arm   Pain Orientation Left   Pain Descriptors / Indicators Stabbing;Other (Comment)  cold  Pain Type Acute pain   Pain Onset 1 to 4 weeks ago   Pain Frequency Constant   Aggravating Factors  weather   Pain Relieving Factors pain meds at night to rest help a little but doesnt go away   Multiple Pain Sites No       Treatment focused on CVA education on signs/symptoms and risk factors as well as pt performing and providing pt with HEP.  See pt instructions for details         PT Education - 12/04/15 1537    Education provided Yes   Education Details HEP, CVA education on warning signs/symptoms and risk factors   Person(s) Educated Patient   Methods Explanation;Handout   Comprehension Verbalized understanding             PT Long Term Goals - 11/28/15 2134    PT LONG TERM GOAL #1   Title Pt will be independent  with HEP for improved balance, gait and transfers.  TARGET 12/27/15   Time 4   Period Weeks   Status New   PT LONG TERM GOAL #2   Title Pt will improve TUG score to less than or equal to 13.5 seconds for decreased fall risk.   Time 4   Period Weeks   Status New   PT LONG TERM GOAL #3   Title Pt will improve gait velocity to at least 2.3 ft/sec for improved gait efficiency and safety.   Time 4   Period Weeks   Status New   PT LONG TERM GOAL #4   Title Pt will improve Dynamic Gait Index score to less than or equal to 12/24 for decreased fall risk.   Time 4   Status New   PT LONG TERM GOAL #5   Title Pt will verbalize understanding of CVA education.   Time 4   Period Weeks   Status New               Plan - 12/04/15 1538    Clinical Impression Statement Pt continues with decreased balance and R sided weakness.  Needed rest breaks during session today.  Did c/o dizziness when she closed her eyes during exercises and reports doing the same at times in church when she prays.  Continue PT per POC.   Pt will benefit from skilled therapeutic intervention in order to improve on the following deficits Abnormal gait;Decreased activity tolerance;Decreased balance;Decreased mobility;Decreased strength;Difficulty walking   Rehab Potential Good   Clinical Impairments Affecting Rehab Potential Pt on dialysis 3x/wk   PT Frequency 2x / week   PT Duration 4 weeks  plus eval   PT Treatment/Interventions ADLs/Self Care Home Management;Therapeutic exercise;Therapeutic activities;Functional mobility training;Gait training;DME Instruction;Balance training;Neuromuscular re-education;Patient/family education   PT Next Visit Plan Review HEP, add corner balance exercises for vestibular per pt report of dizziness with eyes closed   Consulted and Agree with Plan of Care Patient        Problem List Patient Active Problem List   Diagnosis Date Noted  . Neurologic abnormality 11/19/2015  . Altered  mental state 11/19/2015  . Hyperkalemia 11/19/2015  . Anxiety 11/19/2015  . Insomnia 11/19/2015  . ESRD (end stage renal disease) on dialysis ([place])   . Gait instability   . Stroke-like symptom 11/16/2015  . Vestibular neuritis 11/16/2015  . 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 Dizziness 05/09/2015  . Ataxia 05/09/2015  . H/O: CVA (cerebrovascular accident) 05/09/2015  . Left facial numbness 05/09/2015  . Left leg numbness  05/09/2015  . Hyperlipidemia   . CHF (congestive heart failure) ([place])   . Congestive heart disease ([place])   . Shortness of breath 04/01/2013  . Volume overload 04/01/2013  . HTN (hypertension) 04/01/2013  . Cholecystitis, acute 11/07/2012  . ESRD (end stage renal disease) ([place]) 11/07/2012  . GERD (gastroesophageal reflux disease) 11/07/2012  . Anemia in CKD (chronic kidney disease) 11/07/2012    Narda Bonds 12/04/2015, 3:42 PM  [facility] [address] [place], [place], [address] Phone: [phone]   Fax:  [phone]  Name: [name] MRN: [id number] Date of Birth: 09-Dec-1951    Narda Bonds, [place] 12/04/2015 3:42 PM Phone: [phone] Fax: [phone]

## 2015-12-04 NOTE — Patient Instructions (Addendum)
Stroke Prevention Some medical conditions and behaviors are associated with an increased chance of having a stroke. You may prevent a stroke by making healthy choices and managing medical conditions. HOW CAN I REDUCE MY RISK OF HAVING A STROKE?   Stay physically active. Get at least 30 minutes of activity on most or all days.  Do not smoke. It may also be helpful to avoid exposure to secondhand smoke.  Limit alcohol use. Moderate alcohol use is considered to be:  No more than 2 drinks per day for men.  No more than 1 drink per day for nonpregnant women.  Eat healthy foods. This involves:  Eating 5 or more servings of fruits and vegetables a day.  Making dietary changes that address high blood pressure (hypertension), high cholesterol, diabetes, or obesity.  Manage your cholesterol levels.  Making food choices that are high in fiber and low in saturated fat, trans fat, and cholesterol may control cholesterol levels.  Take any prescribed medicines to control cholesterol as directed by your health care provider.  Manage your diabetes.  Controlling your carbohydrate and sugar intake is recommended to manage diabetes.  Take any prescribed medicines to control diabetes as directed by your health care provider.  Control your hypertension.  Making food choices that are low in salt (sodium), saturated fat, trans fat, and cholesterol is recommended to manage hypertension.  Ask your health care provider if you need treatment to lower your blood pressure. Take any prescribed medicines to control hypertension as directed by your health care provider.  If you are 18-39 years of age, have your blood pressure checked every 3-5 years. If you are 40 years of age or older, have your blood pressure checked every year.  Maintain a healthy weight.  Reducing calorie intake and making food choices that are low in sodium, saturated fat, trans fat, and cholesterol are recommended to manage  weight.  Stop drug abuse.  Avoid taking birth control pills.  Talk to your health care provider about the risks of taking birth control pills if you are over 35 years old, smoke, get migraines, or have ever had a blood clot.  Get evaluated for sleep disorders (sleep apnea).  Talk to your health care provider about getting a sleep evaluation if you snore a lot or have excessive sleepiness.  Take medicines only as directed by your health care provider.  For some people, aspirin or blood thinners (anticoagulants) are helpful in reducing the risk of forming abnormal blood clots that can lead to stroke. If you have the irregular heart rhythm of atrial fibrillation, you should be on a blood thinner unless there is a good reason you cannot take them.  Understand all your medicine instructions.  Make sure that other conditions (such as anemia or atherosclerosis) are addressed. SEEK IMMEDIATE MEDICAL CARE IF:   You have sudden weakness or numbness of the face, arm, or leg, especially on one side of the body.  Your face or eyelid droops to one side.  You have sudden confusion.  You have trouble speaking (aphasia) or understanding.  You have sudden trouble seeing in one or both eyes.  You have sudden trouble walking.  You have dizziness.  You have a loss of balance or coordination.  You have a sudden, severe headache with no known cause.  You have new chest pain or an irregular heartbeat. Any of these symptoms may represent a serious problem that is an emergency. Do not wait to see if the symptoms will   go away. Get medical help at once. Call your local emergency services (911 in [place] Do not drive yourself to the hospital.   This information is not intended to replace advice given to you by your health care provider. Make sure you discuss any questions you have with your health care provider.   Document Released: 01/07/2005 Document Revised: 12/21/2014 Document Reviewed:  06/02/2013 Elsevier Interactive Patient Education 2016 [place]: Standing (Active)    Holding onto counter for support.  Stand, feet flat. Lift right leg out to side. Repeat on left leg.  Do 10 times on each side once a day.   http://gtsc.exer.us/111   Copyright  VHI. All rights reserved.   "I love a Programme researcher, broadcasting/film/video for support, march in place alternating LE's 10 times trying to raise knee as high as you can.  Do once a day.  http://gt2.exer.us/345   Copyright  VHI. All rights reserved.   HIP / KNEE: Extension - Standing    Holding counter for support.  Squeeze glutes. Raise and lift leg backward. Keep knee straight or slightly bent.  Repeat on other leg.  Do 10 times on each leg once a day.   Copyright  VHI. All rights reserved.   FLEXION: Standing - Stable (Active)    Holding counter for support.  Stand, both feet flat. Bend right knee, bringing heel toward buttocks.  Repeat on left leg and do 10 times on each side once a day.  http://gtsc.exer.us/241   Copyright  VHI. All rights reserved.   SINGLE LIMB STANCE    Hold counter for support.  Raise one leg and stand on the other leg counting to 10.  Repeat on other leg.  Do 3 times on each side once a day.  Copyright  VHI. All rights reserved.

## 2015-12-05 ENCOUNTER — Emergency Department (HOSPITAL_COMMUNITY)
Admission: EM | Admit: 2015-12-05 | Discharge: 2015-12-05 | Disposition: A | Discharge status: Home / Self Care | Payer: Medicare Other | Attending: Emergency Medicine | Admitting: Emergency Medicine

## 2015-12-05 ENCOUNTER — Encounter (HOSPITAL_COMMUNITY): Payer: Self-pay | Admitting: Neurology

## 2015-12-05 DIAGNOSIS — D649 Anemia, unspecified: Secondary | ICD-10-CM | POA: Diagnosis not present

## 2015-12-05 DIAGNOSIS — Z8673 Personal history of transient ischemic attack (TIA), and cerebral infarction without residual deficits: Secondary | ICD-10-CM | POA: Insufficient documentation

## 2015-12-05 DIAGNOSIS — Z940 Kidney transplant status: Secondary | ICD-10-CM | POA: Insufficient documentation

## 2015-12-05 DIAGNOSIS — M1990 Unspecified osteoarthritis, unspecified site: Secondary | ICD-10-CM | POA: Insufficient documentation

## 2015-12-05 DIAGNOSIS — F329 Major depressive disorder, single episode, unspecified: Secondary | ICD-10-CM | POA: Insufficient documentation

## 2015-12-05 DIAGNOSIS — Z95828 Presence of other vascular implants and grafts: Secondary | ICD-10-CM | POA: Diagnosis not present

## 2015-12-05 DIAGNOSIS — Z87891 Personal history of nicotine dependence: Secondary | ICD-10-CM | POA: Insufficient documentation

## 2015-12-05 DIAGNOSIS — R531 Weakness: Secondary | ICD-10-CM

## 2015-12-05 DIAGNOSIS — Z792 Long term (current) use of antibiotics: Secondary | ICD-10-CM | POA: Diagnosis not present

## 2015-12-05 DIAGNOSIS — M810 Age-related osteoporosis without current pathological fracture: Secondary | ICD-10-CM | POA: Insufficient documentation

## 2015-12-05 DIAGNOSIS — D631 Anemia in chronic kidney disease: Secondary | ICD-10-CM | POA: Diagnosis not present

## 2015-12-05 DIAGNOSIS — Z79899 Other long term (current) drug therapy: Secondary | ICD-10-CM | POA: Insufficient documentation

## 2015-12-05 DIAGNOSIS — F419 Anxiety disorder, unspecified: Secondary | ICD-10-CM | POA: Diagnosis not present

## 2015-12-05 DIAGNOSIS — N2581 Secondary hyperparathyroidism of renal origin: Secondary | ICD-10-CM | POA: Diagnosis not present

## 2015-12-05 DIAGNOSIS — L03114 Cellulitis of left upper limb: Secondary | ICD-10-CM | POA: Diagnosis not present

## 2015-12-05 DIAGNOSIS — D509 Iron deficiency anemia, unspecified: Secondary | ICD-10-CM | POA: Diagnosis not present

## 2015-12-05 DIAGNOSIS — N186 End stage renal disease: Secondary | ICD-10-CM | POA: Insufficient documentation

## 2015-12-05 DIAGNOSIS — I120 Hypertensive chronic kidney disease with stage 5 chronic kidney disease or end stage renal disease: Secondary | ICD-10-CM | POA: Diagnosis not present

## 2015-12-05 DIAGNOSIS — I509 Heart failure, unspecified: Secondary | ICD-10-CM | POA: Diagnosis not present

## 2015-12-05 DIAGNOSIS — Z8701 Personal history of pneumonia (recurrent): Secondary | ICD-10-CM | POA: Insufficient documentation

## 2015-12-05 DIAGNOSIS — Z86010 Personal history of colonic polyps: Secondary | ICD-10-CM | POA: Diagnosis not present

## 2015-12-05 DIAGNOSIS — E785 Hyperlipidemia, unspecified: Secondary | ICD-10-CM | POA: Diagnosis not present

## 2015-12-05 DIAGNOSIS — K219 Gastro-esophageal reflux disease without esophagitis: Secondary | ICD-10-CM | POA: Diagnosis not present

## 2015-12-05 DIAGNOSIS — Z7982 Long term (current) use of aspirin: Secondary | ICD-10-CM | POA: Diagnosis not present

## 2015-12-05 DIAGNOSIS — R5383 Other fatigue: Secondary | ICD-10-CM | POA: Diagnosis present

## 2015-12-05 LAB — CBC
HCT: 29.3 % — ABNORMAL LOW (ref 36.0–46.0)
Hemoglobin: 9 g/dL — ABNORMAL LOW (ref 12.0–15.0)
MCH: 29.2 pg (ref 26.0–34.0)
MCHC: 30.7 g/dL (ref 30.0–36.0)
MCV: 95.1 fL (ref 78.0–100.0)
PLATELETS: 255 10*3/uL (ref 150–400)
RBC: 3.08 MIL/uL — ABNORMAL LOW (ref 3.87–5.11)
RDW: 20 % — ABNORMAL HIGH (ref 11.5–15.5)
WBC: 6.2 10*3/uL (ref 4.0–10.5)

## 2015-12-05 LAB — SAMPLE TO BLOOD BANK

## 2015-12-05 LAB — BASIC METABOLIC PANEL
Anion gap: 15 (ref 5–15)
BUN: 35 mg/dL — AB (ref 6–20)
CALCIUM: 9.8 mg/dL (ref 8.9–10.3)
CHLORIDE: 99 mmol/L — AB (ref 101–111)
CO2: 30 mmol/L (ref 22–32)
CREATININE: 7.5 mg/dL — AB (ref 0.44–1.00)
GFR calc Af Amer: 6 mL/min — ABNORMAL LOW (ref >60)
GFR calc non Af Amer: 5 mL/min — ABNORMAL LOW (ref >60)
GLUCOSE: 94 mg/dL (ref 65–99)
POTASSIUM: 4 mmol/L (ref 3.5–5.1)
SODIUM: 144 mmol/L (ref 135–145)

## 2015-12-05 LAB — POC OCCULT BLOOD, ED: Fecal Occult Bld: NEGATIVE

## 2015-12-05 MED ORDER — CLINDAMYCIN HCL 150 MG PO CAPS: 300.0000 mg | ORAL_CAPSULE | Freq: Three times a day (TID) | ORAL | Status: DC

## 2015-12-05 NOTE — Progress Notes (Signed)
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  She presents today secondary to generalized weakness.  The ED physician noted left arm erythema above the AV fistula incision.  We have been consulted to check on her post op incisions.    Subjective  - She states she has been using a heating pad over her left upper arm and hand secondary to pain and cold sensation.   She is not a good historian.  She states she had a stroke last week and was in the hospital.  The stroke work up was performed prior to her AV fistula surgery.  It was not deemed a stroke, but and Likely metabolic because the symptoms resolved after HD.   Objective 154/93 79 97.7 F (36.5 C) (Oral) 10 100% No intake or output data in the 24 hours ending 12/05/15 0948  Left upper arm above the incision is red and slightly warm. 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    She is afebrile and WBC is not elevated, but there is erythema.  She may benefit from a round of keflex orally.    Laurence Slate [facility] 12/05/2015 9:48 AM --  Laboratory Lab Results:  Recent Labs  12/05/15 0836  WBC 6.2  HGB 9.0*  HCT 29.3*  PLT 255   BMET  Recent Labs  12/05/15 0836  NA 144  K 4.0  CL 99*  CO2 30  GLUCOSE 94  BUN 35*  CREATININE 7.50*  CALCIUM 9.8    COAG Lab Results  Component Value Date   INR 1.13 11/19/2015   INR 1.06 11/16/2015   INR 1.18 11/29/2012   No results found for: PTT

## 2015-12-05 NOTE — ED Notes (Signed)
Vascular surgery at bedside.

## 2015-12-05 NOTE — Discharge Instructions (Signed)
Take antibiotic as prescribed until all gone. Please follow up with vascular specialist for recheck. Please follow up with your doctor for anemia. Return if worsening.   Weakness Weakness is a lack of strength. It may be felt all over the body (generalized) or in one specific part of the body (focal). Some causes of weakness can be serious. You may need further medical evaluation, especially if you are elderly or you have a history of immunosuppression (such as chemotherapy or HIV), kidney disease, heart disease, or diabetes. CAUSES  Weakness can be caused by many different things, including:  Infection.  Physical exhaustion.  Internal bleeding or other blood loss that results in a lack of red blood cells (anemia).  Dehydration. This cause is more common in elderly people.  Side effects or electrolyte abnormalities from medicines, such as pain medicines or sedatives.  Emotional distress, anxiety, or depression.  Circulation problems, especially severe peripheral arterial disease.  Heart disease, such as rapid atrial fibrillation, bradycardia, or heart failure.  Nervous system disorders, such as Guillain-Barr syndrome, multiple sclerosis, or stroke. DIAGNOSIS  To find the cause of your weakness, your caregiver will take your history and perform a physical exam. Lab tests or X-rays may also be ordered, if needed. TREATMENT  Treatment of weakness depends on the cause of your symptoms and can vary greatly. HOME CARE INSTRUCTIONS   Rest as needed.  Eat a well-balanced diet.  Try to get some exercise every day.  Only take over-the-counter or prescription medicines as directed by your caregiver. SEEK MEDICAL CARE IF:   Your weakness seems to be getting worse or spreads to other parts of your body.  You develop new aches or pains. SEEK IMMEDIATE MEDICAL CARE IF:   You cannot perform your normal daily activities, such as getting dressed and feeding yourself.  You cannot walk up  and down stairs, or you feel exhausted when you do so.  You have shortness of breath or chest pain.  You have difficulty moving parts of your body.  You have weakness in only one area of the body or on only one side of the body.  You have a fever.  You have trouble speaking or swallowing.  You cannot control your bladder or bowel movements.  You have black or bloody vomit or stools. MAKE SURE YOU:  Understand these instructions.  Will watch your condition.  Will get help right away if you are not doing well or get worse.   This information is not intended to replace advice given to you by your health care provider. Make sure you discuss any questions you have with your health care provider.   Document Released: 11/30/2005 Document Revised: 05/31/2012 Document Reviewed: 01/29/2012 Elsevier Interactive Patient Education 2016 Elsevier Inc.  Cellulitis Cellulitis is an infection of the skin and the tissue beneath it. The infected area is usually red and tender. Cellulitis occurs most often in the arms and lower legs.  CAUSES  Cellulitis is caused by bacteria that enter the skin through cracks or cuts in the skin. The most common types of bacteria that cause cellulitis are staphylococci and streptococci. SIGNS AND SYMPTOMS   Redness and warmth.  Swelling.  Tenderness or pain.  Fever. DIAGNOSIS  Your health care provider can usually determine what is wrong based on a physical exam. Blood tests may also be done. TREATMENT  Treatment usually involves taking an antibiotic medicine. HOME CARE INSTRUCTIONS   Take your antibiotic medicine as directed by your health care provider.  Finish the antibiotic even if you start to feel better.  Keep the infected arm or leg elevated to reduce swelling.  Apply a warm cloth to the affected area up to 4 times per day to relieve pain.  Take medicines only as directed by your health care provider.  Keep all follow-up visits as directed  by your health care provider. SEEK MEDICAL CARE IF:   You notice red streaks coming from the infected area.  Your red area gets larger or turns dark in color.  Your bone or joint underneath the infected area becomes painful after the skin has healed.  Your infection returns in the same area or another area.  You notice a swollen bump in the infected area.  You develop new symptoms.  You have a fever. SEEK IMMEDIATE MEDICAL CARE IF:   You feel very sleepy.  You develop vomiting or diarrhea.  You have a general ill feeling (malaise) with muscle aches and pains.   This information is not intended to replace advice given to you by your health care provider. Make sure you discuss any questions you have with your health care provider.   Document Released: 09/09/2005 Document Revised: 08/21/2015 Document Reviewed: 02/15/2012 Elsevier Interactive Patient Education [employer].

## 2015-12-05 NOTE — ED Notes (Signed)
Pt reports feeling tired and cold for 1 week after having a stroke last week. Is dialysis patient and thinks her hemoglobin may be low.

## 2015-12-05 NOTE — ED Notes (Signed)
EDP at bedside  

## 2015-12-05 NOTE — ED Provider Notes (Signed)
CSN: [id number]     Arrival date & time 12/05/15  0757 History   First MD Initiated Contact with Patient 12/05/15 0800     Chief Complaint  Patient presents with  . Fatigue     (Consider location/radiation/quality/duration/timing/severity/associated sxs/prior Treatment) HPI [name] is a 64 y.o. female with hx of ESRD on hemodialysis, anemia, pneumonia, TIA, presents to emergency department complaining of weakness. Patient states she was recently admitted to the hospital, 2 weeks ago, with strokelike symptoms. States it is that she has been gradually getting weaker and fatigued. She states she also feels very cold. Last dialyzed 2 days ago. She went to dialysis this morning, but was sent here for further workup. Patient also had left upper arm fistula placed on 11/22/2015. She states is painful, otherwise doing well. Patient denies noticing any blood in her stool. Denies dark or tarry stools. She denies any other sources of bleeding. She states last time she felt like this her hemoglobin was low. No tx prior to coming in.   Past Medical History  Diagnosis Date  . Anxiety   . Blood transfusion   . Depression   . GERD (gastroesophageal reflux disease)   . Hyperlipidemia   . Hypertension   . Neuromuscular disorder (HCC)     neuropathy hand and legs  . Weight loss, unintentional   . Adenomatous polyp of colon 10/2010  . Diverticula, colon   . CHF (congestive heart failure) ([place])   . 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  Had deceased donor kidney transplant in 1996.  Had some early rejection then stable function for years, then had slow decline of function and went back on hemodialysis in 2012.  Gets HD TTS schedule at [facility] on [facility] still using L forearm AVF.     Marland Kitchen Anemia in CKD (chronic kidney disease) 11/07/2012  . Arthritis   . Osteoporosis   . Stroke [facility])     TIA  . Pneumonia    . Headache   . Constipation    Past Surgical History  Procedure Laterality Date  . Kidney transplant  1996  . Back surgery    . Cervical fusion    . Av fistula placement      for dialysis  . Cholecystectomy  12/02/2012    Procedure: LAPAROSCOPIC CHOLECYSTECTOMY WITH INTRAOPERATIVE CHOLANGIOGRAM;  Surgeon: Edward Jolly, MD;  Location: [place];  Service: General;  Laterality: N/A;  . Eye surgery Bilateral     cataract surgery  . Av fistula placement Left 11/22/2015    Procedure: ARTERIOVENOUS (AV) FISTULA CREATION-LEFT BRACHIOCEPHALIC;  Surgeon: Serafina Mitchell, MD;  Location[facility] OR;  Service: Vascular;  Laterality: Left;  . 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 Colon cancer Brother   . Cancer Brother   . Coronary artery disease Mother 54  . Hyperlipidemia Mother   . Hypertension Mother   . Esophageal cancer Neg Hx   . Stomach cancer Neg Hx   . Rectal cancer Neg Hx    Social History  Substance Use Topics  . Smoking status: Former Smoker    Types: Cigarettes    Quit date: 12/31/1991  . Smokeless  tobacco: Never Used  . Alcohol Use: No   OB History    No data available     Review of Systems  Constitutional: Positive for activity change, appetite change and fatigue. Negative for fever and chills.  Respiratory: Negative for cough, chest tightness and shortness of breath.   Cardiovascular: Negative for chest pain, palpitations and leg swelling.  Gastrointestinal: Negative for nausea, vomiting, abdominal pain, diarrhea and blood in stool.  Genitourinary: Negative for dysuria, flank pain and pelvic pain.  Musculoskeletal: Negative for myalgias, arthralgias, neck pain and neck stiffness.  Skin: Negative for rash.  Neurological: Positive for weakness.  Negative for dizziness and headaches.  All other systems reviewed and are negative.     Allergies  Sulfa antibiotics  Home Medications   Prior to Admission medications   Medication Sig Start Date End Date Taking? Authorizing Provider  acetaminophen (TYLENOL) 325 MG tablet Take 650 mg by mouth every 6 (six) hours as needed for mild pain.    Historical Provider, MD  allopurinol (ZYLOPRIM) 300 MG tablet Take 150 mg by mouth at bedtime.    Historical Provider, MD  ALPRAZolam Duanne Moron) 0.25 MG tablet Take 0.25 mg by mouth 2 (two) times daily.     Historical Provider, MD  aspirin EC 325 MG tablet Take 325 mg by mouth at bedtime.    Historical Provider, MD  b complex-vitamin c-folic acid (NEPHRO-VITE) 0.8 MG TABS tablet Take 1 tablet by mouth at bedtime.    Historical Provider, MD  calcium carbonate (TUMS) 500 MG chewable tablet Chew 500 mg by mouth 3 (three) times daily.    Historical Provider, MD  cinacalcet (SENSIPAR) 30 MG tablet Take 30 mg by mouth daily.    Historical Provider, MD  doxazosin (CARDURA) 8 MG tablet Take 1 tablet (8 mg total) by mouth 2 (two) times daily as needed (hypertension). 11/17/15   Modena Jansky, MD  HYDROcodone-acetaminophen (NORCO) 5-325 MG tablet Take 1 tablet by mouth every 6 (six) hours as needed for moderate pain. 11/22/15   Ulyses Amor, PA-C  HYDROcodone-acetaminophen (NORCO) 5-325 MG tablet Take 1 tablet by mouth every 6 (six) hours as needed for moderate pain. 11/22/15   Ulyses Amor, PA-C  labetalol (NORMODYNE) 300 MG tablet Take 300 mg by mouth 2 (two) times daily. 11/09/12   Velvet Bathe, MD  losartan (COZAAR) 100 MG tablet Take 100 mg by mouth at bedtime.     Historical Provider, MD  promethazine (PHENERGAN) 25 MG tablet Take 25 mg by mouth every 6 (six) hours as needed for nausea or vomiting.    Historical Provider, MD  simvastatin (ZOCOR) 10 MG tablet Take 10 mg by mouth at bedtime.     Historical Provider, MD  sorbitol 70 % solution Take 60 mLs by  mouth daily as needed (constipation).     Historical Provider, MD  traMADol (ULTRAM) 50 MG tablet Take 50 mg by mouth every 12 (twelve) hours as needed for moderate pain.  10/25/15   Historical Provider, MD  zolpidem (AMBIEN) 10 MG tablet Take 10 mg by mouth at bedtime.    Historical Provider, MD   BP 154/90 mmHg  Pulse 55  Temp(Src) 97.7 F (36.5 C) (Oral)  Resp 16  SpO2 100% Physical Exam  Constitutional: She is oriented to person, place, and time. She appears well-developed and well-nourished. No distress.  HENT:  Head: Normocephalic.  Eyes: Conjunctivae and EOM are normal. Pupils are equal, round, and reactive to light.  Neck: Neck supple.  Cardiovascular: Normal rate, regular rhythm and normal heart sounds.   Pulmonary/Chest: Effort normal and breath sounds normal. No respiratory distress. She has no wheezes. She has no rales.  Abdominal: Soft. Bowel sounds are normal. She exhibits no distension. There is no tenderness. There is no rebound.  Musculoskeletal: She exhibits no edema.  Left upper arm fistula with palpable thrill. Incision appears to be normal with no dehiscence or drainage. Mild surrounding erythema, warmth to the touch, ttp. No skin induration.   Neurological: She is alert and oriented to person, place, and time. No cranial nerve deficit. Coordination normal.  Skin: Skin is warm and dry. There is pallor.  Psychiatric: She has a normal mood and affect. Her behavior is normal.  Nursing note and vitals reviewed.   ED Course  Procedures (including critical care time) Labs Review Labs Reviewed  BASIC METABOLIC PANEL - Abnormal; Notable for the following:    Chloride 99 (*)    BUN 35 (*)    Creatinine, Ser 7.50 (*)    GFR calc non Af Amer 5 (*)    GFR calc Af Amer 6 (*)    All other components within normal limits  CBC - Abnormal; Notable for the following:    RBC 3.08 (*)    Hemoglobin 9.0 (*)    HCT 29.3 (*)    RDW 20.0 (*)    All other components within  normal limits  POC OCCULT BLOOD, ED  SAMPLE TO BLOOD BANK    Imaging Review No results found. I have personally reviewed and evaluated these images and lab results as part of my medical decision-making.   EKG Interpretation None      MDM   Final diagnoses:  Anemia, unspecified anemia type  Weakness  Cellulitis of left upper arm    Pt in ED with generalized weakness. No specific complaints. Pt recently had left brachiocephalic fistula placed on 11/22/15. Will get get labs, including hemoglobin. VS show mild htn, otherwise unremarkable. Some cellulitic changes over the fistula, will call vascular.   Patient's hemoglobin today is 9. She is Hemoccult negative. Patient was seen by vascular, agreed that there may be mild cellulitis over left upper arm. We will start her antibiotic. Will have her follow-up closely with vascular and primary care doctor. Patient will go to dialysis today.  Filed Vitals:   12/05/15 0804 12/05/15 0900 12/05/15 0930 12/05/15 1000  BP: 154/90 154/93 149/90 149/85  Pulse: 55 79 79 79  Temp: 97.7 F (36.5 C)     TempSrc: Oral     Resp: 16 10 18 16   SpO2: 100% 100% 97% 97%      Jeannett Senior, PA-C 12/05/15 [place], MD 12/05/15 1352

## 2015-12-06 ENCOUNTER — Encounter: Payer: Self-pay | Admitting: Rehabilitation

## 2015-12-06 ENCOUNTER — Ambulatory Visit: Payer: Medicare Other | Admitting: Rehabilitation

## 2015-12-06 DIAGNOSIS — R269 Unspecified abnormalities of gait and mobility: Secondary | ICD-10-CM

## 2015-12-06 DIAGNOSIS — R29898 Other symptoms and signs involving the musculoskeletal system: Secondary | ICD-10-CM

## 2015-12-06 NOTE — Patient Instructions (Signed)
ABDUCTION: Standing (Active)    Holding onto counter for support. Stand, feet flat. Lift right leg out to side. Repeat on left leg. Do 10 times on each side once a day.   http://gtsc.exer.us/111   Copyright  VHI. All rights reserved.   "I love a Programme researcher, broadcasting/film/video for support, march in place alternating LE's 10 times trying to raise knee as high as you can. Do once a day.  http://gt2.exer.us/345   Copyright  VHI. All rights reserved.   HIP / KNEE: Extension - Standing    Holding counter for support. Squeeze glutes. Raise and lift leg backward. Keep knee straight or slightly bent. Repeat on other leg. Do 10 times on each leg once a day.   Copyright  VHI. All rights reserved.   FLEXION: Standing - Stable (Active)    Holding counter for support. Stand, both feet flat. Bend right knee, bringing heel toward buttocks.  Repeat on left leg and do 10 times on each side once a day.  http://gtsc.exer.us/241   Copyright  VHI. All rights reserved.   SINGLE LIMB STANCE    Hold counter for support. Raise one leg and stand on the other leg counting to 10. Repeat on other leg. Do 3 times on each side once a day.  Copyright  VHI. All rights reserved.   Walking Program:   I want you to start walking 5 mins at a time in order to increase your endurance, 3 days a week.  I would avoid days that you are going to dialysis as these are going to be days that you are more fatigued.  Make sure you time yourself do know exactly how long you have walked.  Use a clear path in your home and use your cane to prevent any loss of balance.    Feet Together, Varied Arm Positions - Eyes Closed    Stand in a corner with chair in front of you for support.  Stand with feet together and arms BY YOUR SIDE. Close eyes and visualize upright position. Hold __20__ seconds. Repeat __3__ times per session. Do __1__ sessions per day.  Copyright  VHI. All rights reserved.    Feet Apart (Compliant Surface) Varied Arm Positions - Eyes Closed    Stand in corner with chair in front of you for support.  Stand on compliant surface: __stacked pillows or couch cushion______ with feet shoulder width apart and arms BY YOUR SIDE. Close eyes and visualize upright position. Hold_20___ seconds. Repeat __3__ times per session. Do __1__ sessions per day.  Copyright  VHI. All rights reserved.   Feet Apart (Compliant Surface) Head Motion - Eyes Open    Stand in corner with chair in front of you for support.  With eyes open, standing on compliant surface: __stacked pillows or cushion______, feet shoulder width apart, move head slowly: up and down x 10 reps.  Repeat moving head side to side x 10 reps.   Do _1___ sessions per day.  Copyright  VHI. All rights reserved.

## 2015-12-06 NOTE — Therapy (Signed)
[facility] [address] [place], [place], [address] Phone: [phone]   Fax:  [phone]  Physical Therapy Treatment  Patient Details  Name: [name] MRN: [id number] Date of Birth: [birth year] Referring Provider: Dr. Delfina Redwood  Encounter Date: 12/06/2015      PT End of Session - 12/06/15 1452    Visit Number 3   Number of Visits 9   Date for PT Re-Evaluation 01/26/16   Authorization Type Medicare-G-code every 10th visit   PT Start Time 1445   PT Stop Time 1530   PT Time Calculation (min) 45 min   Equipment Utilized During Treatment Gait belt   Activity Tolerance Patient tolerated treatment well   Behavior During Therapy [facility] for tasks assessed/performed      Past Medical History  Diagnosis Date  . Anxiety   . Blood transfusion   . Depression   . GERD (gastroesophageal reflux disease)   . Hyperlipidemia   . Hypertension   . Neuromuscular disorder (HCC)     neuropathy hand and legs  . Weight loss, unintentional   . Adenomatous polyp of colon 10/2010  . Diverticula, colon   . CHF (congestive heart failure) ([place])   . 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  Had deceased donor kidney transplant in 1996.  Had some early rejection then stable function for years, then had slow decline of function and went back on hemodialysis in 2012.  Gets HD TTS schedule at [facility] on [facility] still using L forearm AVF.     Marland Kitchen Anemia in CKD (chronic kidney disease) 11/07/2012  . Arthritis   . Osteoporosis   . Stroke [facility])     TIA  . Pneumonia   . Headache   . Constipation     Past Surgical History  Procedure Laterality Date  . Kidney transplant  1996  . Back surgery    . Cervical fusion    . Av fistula placement      for dialysis  . Cholecystectomy  12/02/2012    Procedure: LAPAROSCOPIC CHOLECYSTECTOMY WITH INTRAOPERATIVE  CHOLANGIOGRAM;  Surgeon: Edward Jolly, MD;  Location: [place];  Service: General;  Laterality: N/A;  . Eye surgery Bilateral     cataract surgery  . Av fistula placement Left 11/22/2015    Procedure: ARTERIOVENOUS (AV) FISTULA CREATION-LEFT BRACHIOCEPHALIC;  Surgeon: Serafina Mitchell, MD;  Location: [facility] OR;  Service: Vascular;  Laterality: Left;  . 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  Visit Diagnosis:  Abnormality of gait  Weakness of both lower extremities      Subjective Assessment - 12/06/15 1449    Subjective Pt states arm is feeling better, had dialysis yesterday.    Pertinent History Pt reported past TIAs   Patient Stated Goals Pt's goal for therapy is to be able to maneuver legs independently.   Currently in Pain? No/denies           TE:  Reviewed and performed HEP issued during last session.  Pt requires min cues for technique and note that she requires several seated rest breaks due to fatigue.  Initiated walking program for pt to perform 2-3 days per  week (when not in dialysis) to address profound endurance deficits.    NMR:  Added corner balance tasks to HEP to address vestibular deficits, see pt instruction for details.  Tolerated well, again needed multiple seated rest breaks due to fatigue.                        PT Education - 12/06/15 1452    Education provided Yes   Education Details HEP from previous session, addition of balance to HEP, safety with gait at home   Person(s) Educated Patient   Methods Explanation   Comprehension Verbalized understanding             PT Long Term Goals - 11/28/15 2134    PT LONG TERM GOAL #1   Title Pt will be independent with HEP for improved balance, gait and transfers.  TARGET  12/27/15   Time 4   Period Weeks   Status New   PT LONG TERM GOAL #2   Title Pt will improve TUG score to less than or equal to 13.5 seconds for decreased fall risk.   Time 4   Period Weeks   Status New   PT LONG TERM GOAL #3   Title Pt will improve gait velocity to at least 2.3 ft/sec for improved gait efficiency and safety.   Time 4   Period Weeks   Status New   PT LONG TERM GOAL #4   Title Pt will improve Dynamic Gait Index score to less than or equal to 12/24 for decreased fall risk.   Time 4   Status New   PT LONG TERM GOAL #5   Title Pt will verbalize understanding of CVA education.   Time 4   Period Weeks   Status New               Plan - 12/06/15 1452    Clinical Impression Statement Pt continues to demonstrate decreased balance and R side weakness.  Note that she wanted to intermittently use cane in session and recommended that she use cane at all times to prevent LOB and falls.  Pt verbalized understanding.  Also addressed corner balance tasks for vestibular deficits.     Pt will benefit from skilled therapeutic intervention in order to improve on the following deficits Abnormal gait;Decreased activity tolerance;Decreased balance;Decreased mobility;Decreased strength;Difficulty walking   Rehab Potential Good   Clinical Impairments Affecting Rehab Potential Pt on dialysis 3x/wk   PT Frequency 2x / week   PT Duration 4 weeks  plus eval   PT Treatment/Interventions ADLs/Self Care Home Management;Therapeutic exercise;Therapeutic activities;Functional mobility training;Gait training;DME Instruction;Balance training;Neuromuscular re-education;Patient/family education   PT Next Visit Plan Continue with gait training, RLE NMR, balance   Consulted and Agree with Plan of Care Patient        Problem List Patient Active Problem List   Diagnosis Date Noted  . Neurologic abnormality 11/19/2015  . Altered mental state 11/19/2015  . Hyperkalemia 11/19/2015  . Anxiety  11/19/2015  . Insomnia 11/19/2015  . ESRD (end stage renal disease) on dialysis ([place])   . Gait instability   . Stroke-like symptom 11/16/2015  . Vestibular neuritis 11/16/2015  . Stroke (cerebrum) ([place]) 11/16/2015  . Dizziness 05/09/2015  . Ataxia 05/09/2015  . H/O: CVA (cerebrovascular accident) 05/09/2015  . Left facial numbness 05/09/2015  . Left leg numbness 05/09/2015  . Hyperlipidemia   . CHF (congestive heart failure) ([place])   . Congestive heart disease ([place])   .  Shortness of breath 04/01/2013  . Volume overload 04/01/2013  . HTN (hypertension) 04/01/2013  . Cholecystitis, acute 11/07/2012  . ESRD (end stage renal disease) ([place]) 11/07/2012  . GERD (gastroesophageal reflux disease) 11/07/2012  . Anemia in CKD (chronic kidney disease) 11/07/2012    Cameron Sprang, PT, MPT [facility] [address] [place], [place], [address] Phone: [phone]   Fax:  [phone] 12/06/2015, 4:01 PM  Name: [name] MRN: [id number] Date of Birth: 1951-02-16

## 2015-12-07 DIAGNOSIS — N186 End stage renal disease: Secondary | ICD-10-CM | POA: Diagnosis not present

## 2015-12-07 DIAGNOSIS — D631 Anemia in chronic kidney disease: Secondary | ICD-10-CM | POA: Diagnosis not present

## 2015-12-07 DIAGNOSIS — N2581 Secondary hyperparathyroidism of renal origin: Secondary | ICD-10-CM | POA: Diagnosis not present

## 2015-12-07 DIAGNOSIS — D509 Iron deficiency anemia, unspecified: Secondary | ICD-10-CM | POA: Diagnosis not present

## 2015-12-10 ENCOUNTER — Emergency Department (HOSPITAL_COMMUNITY): Payer: Medicare Other

## 2015-12-10 ENCOUNTER — Emergency Department (HOSPITAL_COMMUNITY)
Admission: EM | Admit: 2015-12-10 | Discharge: 2015-12-10 | Disposition: A | Discharge status: Home / Self Care | Payer: Medicare Other | Attending: Emergency Medicine | Admitting: Emergency Medicine

## 2015-12-10 DIAGNOSIS — Z992 Dependence on renal dialysis: Secondary | ICD-10-CM | POA: Diagnosis not present

## 2015-12-10 DIAGNOSIS — R531 Weakness: Secondary | ICD-10-CM | POA: Diagnosis not present

## 2015-12-10 DIAGNOSIS — K219 Gastro-esophageal reflux disease without esophagitis: Secondary | ICD-10-CM | POA: Diagnosis not present

## 2015-12-10 DIAGNOSIS — Z87891 Personal history of nicotine dependence: Secondary | ICD-10-CM | POA: Diagnosis not present

## 2015-12-10 DIAGNOSIS — I509 Heart failure, unspecified: Secondary | ICD-10-CM | POA: Insufficient documentation

## 2015-12-10 DIAGNOSIS — Z862 Personal history of diseases of the blood and blood-forming organs and certain disorders involving the immune mechanism: Secondary | ICD-10-CM | POA: Diagnosis not present

## 2015-12-10 DIAGNOSIS — Z79899 Other long term (current) drug therapy: Secondary | ICD-10-CM | POA: Diagnosis not present

## 2015-12-10 DIAGNOSIS — F419 Anxiety disorder, unspecified: Secondary | ICD-10-CM | POA: Diagnosis not present

## 2015-12-10 DIAGNOSIS — N186 End stage renal disease: Secondary | ICD-10-CM | POA: Diagnosis not present

## 2015-12-10 DIAGNOSIS — I120 Hypertensive chronic kidney disease with stage 5 chronic kidney disease or end stage renal disease: Secondary | ICD-10-CM | POA: Insufficient documentation

## 2015-12-10 DIAGNOSIS — E785 Hyperlipidemia, unspecified: Secondary | ICD-10-CM | POA: Insufficient documentation

## 2015-12-10 DIAGNOSIS — Z7901 Long term (current) use of anticoagulants: Secondary | ICD-10-CM | POA: Insufficient documentation

## 2015-12-10 DIAGNOSIS — M1990 Unspecified osteoarthritis, unspecified site: Secondary | ICD-10-CM | POA: Insufficient documentation

## 2015-12-10 DIAGNOSIS — D509 Iron deficiency anemia, unspecified: Secondary | ICD-10-CM | POA: Diagnosis not present

## 2015-12-10 DIAGNOSIS — D631 Anemia in chronic kidney disease: Secondary | ICD-10-CM | POA: Diagnosis not present

## 2015-12-10 DIAGNOSIS — Z7982 Long term (current) use of aspirin: Secondary | ICD-10-CM | POA: Diagnosis not present

## 2015-12-10 DIAGNOSIS — R7989 Other specified abnormal findings of blood chemistry: Secondary | ICD-10-CM | POA: Diagnosis present

## 2015-12-10 DIAGNOSIS — Z8701 Personal history of pneumonia (recurrent): Secondary | ICD-10-CM | POA: Diagnosis not present

## 2015-12-10 DIAGNOSIS — Z8673 Personal history of transient ischemic attack (TIA), and cerebral infarction without residual deficits: Secondary | ICD-10-CM | POA: Diagnosis not present

## 2015-12-10 DIAGNOSIS — F329 Major depressive disorder, single episode, unspecified: Secondary | ICD-10-CM | POA: Insufficient documentation

## 2015-12-10 DIAGNOSIS — E876 Hypokalemia: Secondary | ICD-10-CM

## 2015-12-10 DIAGNOSIS — N2581 Secondary hyperparathyroidism of renal origin: Secondary | ICD-10-CM | POA: Diagnosis not present

## 2015-12-10 LAB — CBC
HEMATOCRIT: 31.4 % — AB (ref 36.0–46.0)
Hemoglobin: 9.6 g/dL — ABNORMAL LOW (ref 12.0–15.0)
MCH: 28.7 pg (ref 26.0–34.0)
MCHC: 30.6 g/dL (ref 30.0–36.0)
MCV: 94 fL (ref 78.0–100.0)
Platelets: 239 10*3/uL (ref 150–400)
RBC: 3.34 MIL/uL — ABNORMAL LOW (ref 3.87–5.11)
RDW: 20.7 % — ABNORMAL HIGH (ref 11.5–15.5)
WBC: 4.6 10*3/uL (ref 4.0–10.5)

## 2015-12-10 LAB — BASIC METABOLIC PANEL
Anion gap: 15 (ref 5–15)
BUN: 10 mg/dL (ref 6–20)
CHLORIDE: 95 mmol/L — AB (ref 101–111)
CO2: 27 mmol/L (ref 22–32)
Calcium: 8.7 mg/dL — ABNORMAL LOW (ref 8.9–10.3)
Creatinine, Ser: 3.08 mg/dL — ABNORMAL HIGH (ref 0.44–1.00)
GFR calc Af Amer: 17 mL/min — ABNORMAL LOW (ref >60)
GFR calc non Af Amer: 15 mL/min — ABNORMAL LOW (ref >60)
GLUCOSE: 126 mg/dL — AB (ref 65–99)
POTASSIUM: 3 mmol/L — AB (ref 3.5–5.1)
Sodium: 137 mmol/L (ref 135–145)

## 2015-12-10 MED ORDER — POTASSIUM CHLORIDE CRYS ER 20 MEQ PO TBCR
60.0000 meq | EXTENDED_RELEASE_TABLET | Freq: Once | ORAL | Status: AC
  Administered 2015-12-10: 60 meq via ORAL
  Filled 2015-12-10: qty 3

## 2015-12-10 NOTE — Discharge Instructions (Signed)
Her potassium was lower than normal, and I would advise eating several bananas a day due to the fact that you have have had a history of high potassium.  This can cause some fatigue along with the fact that she did have an infection in your arm.  Continue the antibiotics.  Return here for any worsening in your condition

## 2015-12-10 NOTE — ED Notes (Signed)
Pt states, "I went to get dialysis & was told to come her because my hemoglobin is dropping. It was 11 & now is 8." pt reports a CVA x 2 wks, pt reports R leg weakness from stroke & reports taking therapy, pt has HD access in R upper chest, pt reports recent L arm HD graft placement, denies obvious blood loss, pt A&O x4, pt reports receiving full HD tx today

## 2015-12-10 NOTE — ED Provider Notes (Signed)
CSN: [id number]     Arrival date & time 12/10/15  1258 History   First MD Initiated Contact with Patient 12/10/15 1535     Chief Complaint  Patient presents with  . Abnormal Lab     (Consider location/radiation/quality/duration/timing/severity/associated sxs/prior Treatment) HPI Patient presents to the emergency department with generalized weakness.  Patient states she talked to her nephrologist about this and she came here for further evaluation.  The patient is feels tired and states that they told her that her hemoglobin may be low and come be evaluated for this.  The patient states that she does not have any specific area of weakness.  Patient states she is currently taking clindamycin for infection that she developed in her upper arm at the area of the graft site insertion.  Patient states that nothing seems to make her condition better or worse.  Patient denies chest pain, shortness of breath, nausea, vomiting, weakness, dizziness, headache, blurred vision, back pain, neck pain, fever, incontinence, rash, or syncope.  The patient states that she did not take any medications prior to arrival for her condition Past Medical History  Diagnosis Date  . Anxiety   . Blood transfusion   . Depression   . GERD (gastroesophageal reflux disease)   . Hyperlipidemia   . Hypertension   . Neuromuscular disorder (HCC)     neuropathy hand and legs  . Weight loss, unintentional   . Adenomatous polyp of colon 10/2010  . Diverticula, colon   . CHF (congestive heart failure) ([place])   . 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  Had deceased donor kidney transplant in 1996.  Had some early rejection then stable function for years, then had slow decline of function and went back on hemodialysis in 2012.  Gets HD TTS schedule at [facility] on [address] [address] still using L forearm AVF.     Marland Kitchen Anemia in CKD (chronic kidney disease)  11/07/2012  . Arthritis   . Osteoporosis   . Stroke [facility])     TIA  . Pneumonia   . Headache   . Constipation    Past Surgical History  Procedure Laterality Date  . Kidney transplant  1996  . Back surgery    . Cervical fusion    . Av fistula placement      for dialysis  . Cholecystectomy  12/02/2012    Procedure: LAPAROSCOPIC CHOLECYSTECTOMY WITH INTRAOPERATIVE CHOLANGIOGRAM;  Surgeon: Edward Jolly, MD;  Location: [place];  Service: General;  Laterality: N/A;  . Eye surgery Bilateral     cataract surgery  . Av fistula placement Left 11/22/2015    Procedure: ARTERIOVENOUS (AV) FISTULA CREATION-LEFT BRACHIOCEPHALIC;  Surgeon: Serafina Mitchell, MD;  Location: [facility] OR;  Service: Vascular;  Laterality: Left;  . 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 Colon cancer Brother   . Cancer Brother   . Coronary artery disease Mother 70  . Hyperlipidemia Mother   . Hypertension Mother   . Esophageal cancer Neg Hx   . Stomach cancer Neg Hx   . Rectal cancer Neg Hx    Social History  Substance Use Topics  . Smoking status: Former Smoker  Types: Cigarettes    Quit date: 12/31/1991  . Smokeless tobacco: Never Used  . Alcohol Use: No   OB History    No data available     Review of Systems  All other systems negative except as documented in the HPI. All pertinent positives and negatives as reviewed in the HPI.  Allergies  Sulfa antibiotics  Home Medications   Prior to Admission medications   Medication Sig Start Date End Date Taking? Authorizing Provider  acetaminophen (TYLENOL) 325 MG tablet Take 650 mg by mouth every 6 (six) hours as needed for mild pain.    Historical Provider, MD  allopurinol (ZYLOPRIM) 300 MG tablet Take 150 mg by mouth at bedtime.     Historical Provider, MD  ALPRAZolam Duanne Moron) 0.25 MG tablet Take 0.25 mg by mouth 2 (two) times daily.     Historical Provider, MD  aspirin EC 325 MG tablet Take 325 mg by mouth at bedtime.    Historical Provider, MD  b complex-vitamin c-folic acid (NEPHRO-VITE) 0.8 MG TABS tablet Take 1 tablet by mouth at bedtime.    Historical Provider, MD  calcium carbonate (TUMS) 500 MG chewable tablet Chew 500 mg by mouth 3 (three) times daily.    Historical Provider, MD  cinacalcet (SENSIPAR) 30 MG tablet Take 30 mg by mouth daily.    Historical Provider, MD  clindamycin (CLEOCIN) 150 MG capsule Take 2 capsules (300 mg total) by mouth 3 (three) times daily. 12/05/15   Tatyana Kirichenko, PA-C  doxazosin (CARDURA) 8 MG tablet Take 1 tablet (8 mg total) by mouth 2 (two) times daily as needed (hypertension). 11/17/15   Modena Jansky, MD  HYDROcodone-acetaminophen (NORCO) 5-325 MG tablet Take 1 tablet by mouth every 6 (six) hours as needed for moderate pain. 11/22/15   Ulyses Amor, PA-C  HYDROcodone-acetaminophen (NORCO) 5-325 MG tablet Take 1 tablet by mouth every 6 (six) hours as needed for moderate pain. 11/22/15   Ulyses Amor, PA-C  labetalol (NORMODYNE) 300 MG tablet Take 300 mg by mouth 2 (two) times daily. 11/09/12   Velvet Bathe, MD  lactulose (CHRONULAC) 10 GM/15ML solution Take 20 g by mouth daily as needed for mild constipation.    Historical Provider, MD  losartan (COZAAR) 100 MG tablet Take 100 mg by mouth at bedtime.     Historical Provider, MD  promethazine (PHENERGAN) 25 MG tablet Take 25 mg by mouth every 6 (six) hours as needed for nausea or vomiting.    Historical Provider, MD  sevelamer carbonate (RENVELA) 800 MG tablet Take 1,600 mg by mouth 3 (three) times daily with meals.    Historical Provider, MD  simvastatin (ZOCOR) 10 MG tablet Take 10 mg by mouth at bedtime.     Historical Provider, MD  traMADol (ULTRAM) 50 MG tablet Take 50 mg by mouth every 12 (twelve) hours as needed for  moderate pain.  10/25/15   Historical Provider, MD  zolpidem (AMBIEN) 10 MG tablet Take 10 mg by mouth at bedtime.    Historical Provider, MD   BP 125/78 mmHg  Pulse 84  Temp(Src) 98.3 F (36.8 C) (Oral)  Resp 17  Wt 57.295 kg  SpO2 99% Physical Exam  Constitutional: She is oriented to person, place, and time. She appears well-developed and well-nourished.  HENT:  Head: Normocephalic and atraumatic.  Mouth/Throat: Oropharynx is clear and moist.  Eyes: Pupils are equal, round, and reactive to light.  Neck: Normal range of motion. Neck supple.  Cardiovascular: Normal rate and  regular rhythm.  Exam reveals no gallop and no friction rub.   No murmur heard. Pulmonary/Chest: Effort normal and breath sounds normal. No respiratory distress.  Musculoskeletal: She exhibits no edema.  Neurological: She is alert and oriented to person, place, and time. She exhibits normal muscle tone. Coordination normal.  Skin: Skin is warm and dry. No rash noted. No erythema.     Psychiatric: She has a normal mood and affect. Her behavior is normal.  Nursing note and vitals reviewed.   ED Course  Procedures (including critical care time) Labs Review Labs Reviewed  BASIC METABOLIC PANEL - Abnormal; Notable for the following:    Potassium 3.0 (*)    Chloride 95 (*)    Glucose, Bld 126 (*)    Creatinine, Ser 3.08 (*)    Calcium 8.7 (*)    GFR calc non Af Amer 15 (*)    GFR calc Af Amer 17 (*)    All other components within normal limits  CBC - Abnormal; Notable for the following:    RBC 3.34 (*)    Hemoglobin 9.6 (*)    HCT 31.4 (*)    RDW 20.7 (*)    All other components within normal limits  CBG MONITORING, ED    Imaging Review Dg Chest 2 View  12/10/2015  CLINICAL DATA:  Right-sided weakness. EXAM: CHEST  2 VIEW COMPARISON:  November 16, 2015. FINDINGS: Stable cardiomegaly. No pneumothorax or pleural effusion is noted. Stable irregular densities are noted in right upper lobe. Right internal  jugular catheter is unchanged with distal tip in expected position of the SVC. No pneumothorax or pleural effusion is noted. Old left rib fracture is noted. Stable bibasilar scarring is noted. IMPRESSION: No acute cardiopulmonary abnormality seen. Electronically Signed   By: Marijo Conception, M.D.   On: 12/10/2015 17:26   I have personally reviewed and evaluated these images and lab results as part of my medical decision-making.   EKG Interpretation None      MDM   Final diagnoses:  None    The patient could be feeling some of these symptoms did the fact she does have a mild infection in the upper arm.  The patient looks well on examination.  Does not appear in any significant distress.  I advised her she will need follow up closely with her vascular surgeon who put her on the antibiotics and return here for any worsening in her condition.  Her vital signs remained normal here in the emergency department.  She does not show any outward signs of sepsis.  She does have a low potassium which also could contribute to this    Dalia Heading, PA-C 12/10/15 1749  Julianne Rice, MD 12/18/15 [phone]

## 2015-12-11 ENCOUNTER — Ambulatory Visit: Payer: Medicare Other | Admitting: Physical Therapy

## 2015-12-11 DIAGNOSIS — R29898 Other symptoms and signs involving the musculoskeletal system: Secondary | ICD-10-CM | POA: Diagnosis not present

## 2015-12-11 DIAGNOSIS — R269 Unspecified abnormalities of gait and mobility: Secondary | ICD-10-CM | POA: Diagnosis not present

## 2015-12-11 NOTE — Therapy (Signed)
[facility] [address] [place], [place], [address] Phone: [phone]   Fax:  [phone]  Physical Therapy Treatment  Patient Details  Name: [name] MRN: [id number] Date of Birth: 02-May-1951 Referring Provider: Dr. Nehemiah Settle  Encounter Date: 12/11/2015      PT End of Session - 12/11/15 1604    Visit Number 4   Number of Visits 9   Date for PT Re-Evaluation 01/26/16   Authorization Type Medicare-G-code every 10th visit   PT Start Time 1535   PT Stop Time 1600   PT Time Calculation (min) 25 min   Equipment Utilized During Treatment Gait belt   Activity Tolerance Patient limited by fatigue   Behavior During Therapy --  fatigued, requests to sit to rest      Past Medical History  Diagnosis Date  . Anxiety   . Blood transfusion   . Depression   . GERD (gastroesophageal reflux disease)   . Hyperlipidemia   . Hypertension   . Neuromuscular disorder (HCC)     neuropathy hand and legs  . Weight loss, unintentional   . Adenomatous polyp of colon 10/2010  . Diverticula, colon   . CHF (congestive heart failure) (HCC)   . 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  Had deceased donor kidney transplant in 1996.  Had some early rejection then stable function for years, then had slow decline of function and went back on hemodialysis in 2012.  Gets HD TTS schedule at [facility] on [facility] still using L forearm AVF.     Marland Kitchen Anemia in CKD (chronic kidney disease) 11/07/2012  . Arthritis   . Osteoporosis   . Stroke [name])     TIA  . Pneumonia   . Headache   . Constipation     Past Surgical History  Procedure Laterality Date  . Kidney transplant  1996  . Back surgery    . Cervical fusion    . Av fistula placement      for dialysis  . Cholecystectomy  12/02/2012    Procedure: LAPAROSCOPIC CHOLECYSTECTOMY WITH INTRAOPERATIVE  CHOLANGIOGRAM;  Surgeon: Mariella Saa, MD;  Location: [facility] OR;  Service: General;  Laterality: N/A;  . Eye surgery Bilateral     cataract surgery  . Av fistula placement Left 11/22/2015    Procedure: ARTERIOVENOUS (AV) FISTULA CREATION-LEFT BRACHIOCEPHALIC;  Surgeon: Nada Libman, MD;  Location: [facility] OR;  Service: Vascular;  Laterality: Left;  . 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  Visit Diagnosis:  Weakness of both lower extremities      Subjective Assessment - 12/11/15 1540    Subjective Have just felt weak, cold, just not feeling strong; went to ED the other day and they said hypokalemia.  Denies any falls.   Currently in Pain? No/denies               Therapeutic Exercise: -Attempted review of HEP from last visit-pt performs standing hip abduction x 10 reps, then marching x 10 reps.  She requests to sit due to fatigue.   -Pt performs seated LAQ x 10, marching x 10, heelslides x 10, then ankle pumps x 10  reps.  Pt requires brief rest in between.    -Spoke with patient regarding her concerns about hypokalemia and her fatigue.  Discussed that PT will continue to proceed to work with her within allowable parameters given her recent ED visit.  Also recommended that pt contact her primary care physician regarding her ongoing c/o fatigue and decreased energy level.  Pt in agreement.  Treatment shortened today due to pt feeling too fatigued to continue.                        PT Long Term Goals - 11/28/15 2134    PT LONG TERM GOAL #1   Title Pt will be independent with HEP for improved balance, gait and transfers.  TARGET 12/27/15   Time 4   Period Weeks   Status New   PT LONG TERM GOAL #2   Title Pt will improve TUG score to less than or  equal to 13.5 seconds for decreased fall risk.   Time 4   Period Weeks   Status New   PT LONG TERM GOAL #3   Title Pt will improve gait velocity to at least 2.3 ft/sec for improved gait efficiency and safety.   Time 4   Period Weeks   Status New   PT LONG TERM GOAL #4   Title Pt will improve Dynamic Gait Index score to less than or equal to 12/24 for decreased fall risk.   Time 4   Status New   PT LONG TERM GOAL #5   Title Pt will verbalize understanding of CVA education.   Time 4   Period Weeks   Status New               Plan - 12/11/15 1605    Clinical Impression Statement Treatment session limited today by pt's fatigue, especially due to pt's concerns over going to ED yesterday with hypokalemia.  Had discussion with pt regarding ongoing medical concerns and need to get these under control to allow for full participation in therapy.  Pt will continue to benefit from further skilled PT to address strength, balance, and gait.   Pt will benefit from skilled therapeutic intervention in order to improve on the following deficits Abnormal gait;Decreased activity tolerance;Decreased balance;Decreased mobility;Decreased strength;Difficulty walking   Rehab Potential Good   Clinical Impairments Affecting Rehab Potential Pt on dialysis 3x/wk   PT Frequency 2x / week   PT Duration 4 weeks  plus eval   PT Treatment/Interventions ADLs/Self Care Home Management;Therapeutic exercise;Therapeutic activities;Functional mobility training;Gait training;DME Instruction;Balance training;Neuromuscular re-education;Patient/family education   PT Next Visit Plan Review standing exercises, progress gait, strength, balance activities as able   Consulted and Agree with Plan of Care Patient        Problem List Patient Active Problem List   Diagnosis Date Noted  . Neurologic abnormality 11/19/2015  . Altered mental state 11/19/2015  . Hyperkalemia 11/19/2015  . Anxiety 11/19/2015  . Insomnia  11/19/2015  . ESRD (end stage renal disease) on dialysis (HCC)   . Gait instability   . Stroke-like symptom 11/16/2015  . Vestibular neuritis 11/16/2015  . 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 Dizziness 05/09/2015  . Ataxia 05/09/2015  . H/O: CVA (cerebrovascular accident) 05/09/2015  . Left facial numbness 05/09/2015  . Left leg numbness 05/09/2015  . Hyperlipidemia   . CHF (congestive heart failure) (HCC)   . Congestive heart disease (HCC)   . Shortness of breath 04/01/2013  . Volume overload  04/01/2013  . HTN (hypertension) 04/01/2013  . Cholecystitis, acute 11/07/2012  . ESRD (end stage renal disease) (HCC) 11/07/2012  . GERD (gastroesophageal reflux disease) 11/07/2012  . Anemia in CKD (chronic kidney disease) 11/07/2012    Lubertha Leite W. 12/11/2015, 4:08 PM Gean Maidens., PT [facility] [address] [place], [place], [address] Phone: [phone]   Fax:  [phone]  Name: [name] MRN: [id number] Date of Birth: [birth year]

## 2015-12-12 ENCOUNTER — Encounter: Payer: Self-pay | Admitting: Surgery

## 2015-12-12 DIAGNOSIS — N2581 Secondary hyperparathyroidism of renal origin: Secondary | ICD-10-CM | POA: Diagnosis not present

## 2015-12-12 DIAGNOSIS — D631 Anemia in chronic kidney disease: Secondary | ICD-10-CM | POA: Diagnosis not present

## 2015-12-12 DIAGNOSIS — D509 Iron deficiency anemia, unspecified: Secondary | ICD-10-CM | POA: Diagnosis not present

## 2015-12-12 DIAGNOSIS — N186 End stage renal disease: Secondary | ICD-10-CM | POA: Diagnosis not present

## 2015-12-13 ENCOUNTER — Ambulatory Visit: Payer: Medicare Other | Admitting: Rehabilitation

## 2015-12-13 ENCOUNTER — Encounter: Payer: Self-pay | Admitting: Rehabilitation

## 2015-12-13 DIAGNOSIS — R269 Unspecified abnormalities of gait and mobility: Secondary | ICD-10-CM

## 2015-12-13 DIAGNOSIS — R29898 Other symptoms and signs involving the musculoskeletal system: Secondary | ICD-10-CM | POA: Diagnosis not present

## 2015-12-13 NOTE — Therapy (Signed)
[facility] [address] [place], [place], [address] Phone: [phone]   Fax:  [phone]  Physical Therapy Treatment  Patient Details  Name: [name] MRN: [id number] Date of Birth: 07-13-1951 Referring Provider: Dr. Delfina Redwood  Encounter Date: 12/13/2015      PT End of Session - 12/13/15 1556    Visit Number 5   Number of Visits 9   Date for PT Re-Evaluation 01/26/16   Authorization Type Medicare-G-code every 10th visit   PT Start Time 1445   PT Stop Time 1530   PT Time Calculation (min) 45 min   Equipment Utilized During Treatment Gait belt   Activity Tolerance Patient limited by fatigue   Behavior During Therapy --  fatigued, less requests to sit to rest today      Past Medical History  Diagnosis Date  . Anxiety   . Blood transfusion   . Depression   . GERD (gastroesophageal reflux disease)   . Hyperlipidemia   . Hypertension   . Neuromuscular disorder (HCC)     neuropathy hand and legs  . Weight loss, unintentional   . Adenomatous polyp of colon 10/2010  . Diverticula, colon   . CHF (congestive heart failure) ([place])   . 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  Had deceased donor kidney transplant in 1996.  Had some early rejection then stable function for years, then had slow decline of function and went back on hemodialysis in 2012.  Gets HD TTS schedule at [facility] still using L forearm AVF.     Marland Kitchen Anemia in CKD (chronic kidney disease) 11/07/2012  . Arthritis   . Osteoporosis   . Stroke [facility])     TIA  . Pneumonia   . Headache   . Constipation     Past Surgical History  Procedure Laterality Date  . Kidney transplant  1996  . Back surgery    . Cervical fusion    . Av fistula placement      for dialysis  . Cholecystectomy  12/02/2012    Procedure: LAPAROSCOPIC CHOLECYSTECTOMY WITH  INTRAOPERATIVE CHOLANGIOGRAM;  Surgeon: Edward Jolly, MD;  Location: [place];  Service: General;  Laterality: N/A;  . Eye surgery Bilateral     cataract surgery  . Av fistula placement Left 11/22/2015    Procedure: ARTERIOVENOUS (AV) FISTULA CREATION-LEFT BRACHIOCEPHALIC;  Surgeon: Serafina Mitchell, MD;  Location: [facility] OR;  Service: Vascular;  Laterality: Left;  . 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  Visit Diagnosis:  Abnormality of gait  Weakness of both lower extremities      Subjective Assessment - 12/13/15 1448    Subjective Pt continues to report that she feels weak, tired.     Pertinent History Pt reported past TIAs   Patient Stated Goals Pt's goal for therapy is to be able to maneuver legs independently.   Currently in Pain? No/denies              Therex:  Went over two standing exercises from HEP as she states she is "having trouble with these."  Performed standing hip abd x 10 reps BLE and standing hip  ext x 10 reps BLE with tactile and verbal cues for technique and posture.  Performed BLE strengthening while standing in // bars stepping R leg to 7"step and then raising LLE to chair.  Performed x 10 reps BLEs with BUE>single UE support only.     NMR:  Progressed to dynamic standing task while on red therapy mat tapping to cones alternating LEs with side steps in between to address SLS and BLE strength.  Performed x 2 sets of 5 reps and progressed to tipping cone over and back up alternating LEs, to increase challenge and time spent in SLS.  Requires min/guard to min A (only min A on 2 occasions due to overt R LOB).  Progressed to standing balance in // bars while standing on foam airdex pad with feet apart (slightly) without UEs, EC head turns side/side  x 10 reps and up/down x 10 reps.  Progressed to standing on foam balance beam to further challenge hip strategy.  Again, performed with EC x 1 min.  Transitioned to standing with RLE on foam transitioning LLE over and back for increased stabilization in RLE.  Performed x 5 reps with UE support and x 10 reps without UE support, LLE on foam in same fashion.  Ended session with wall bumps while standing on foam balance beam.  Performed x 10 reps with cues for 5 sec pause.  Tolerated all well.  She did need seated rest breaks during session, however seemed to demonstrate improved activity tolerance this session.                      PT Education - 12/13/15 1448    Education provided Yes   Education Details compliance with HEP, esp standing therex   Person(s) Educated Patient   Methods Explanation   Comprehension Verbalized understanding             PT Long Term Goals - 11/28/15 2134    PT LONG TERM GOAL #1   Title Pt will be independent with HEP for improved balance, gait and transfers.  TARGET 12/27/15   Time 4   Period Weeks   Status New   PT LONG TERM GOAL #2   Title Pt will improve TUG score to less than or equal to 13.5 seconds for decreased fall risk.   Time 4   Period Weeks   Status New   PT LONG TERM GOAL #3   Title Pt will improve gait velocity to at least 2.3 ft/sec for improved gait efficiency and safety.   Time 4   Period Weeks   Status New   PT LONG TERM GOAL #4   Title Pt will improve Dynamic Gait Index score to less than or equal to 12/24 for decreased fall risk.   Time 4   Status New   PT LONG TERM GOAL #5   Title Pt will verbalize understanding of CVA education.   Time 4   Period Weeks   Status New               Plan - 12/13/15 1557    Clinical Impression Statement Pt continues to have fatigue during session, but she was able to tolerate increased activity today.  Session focused on functional strengthening as well as high level balance  and ankle/hip strategy.     Pt will benefit from skilled therapeutic intervention in order to improve on the following deficits Abnormal gait;Decreased activity tolerance;Decreased balance;Decreased mobility;Decreased strength;Difficulty walking   Rehab  Potential Good   Clinical Impairments Affecting Rehab Potential Pt on dialysis 3x/wk   PT Frequency 2x / week   PT Duration 4 weeks  plus eval   PT Treatment/Interventions ADLs/Self Care Home Management;Therapeutic exercise;Therapeutic activities;Functional mobility training;Gait training;DME Instruction;Balance training;Neuromuscular re-education;Patient/family education   PT Next Visit Plan Review standing exercises, progress gait, strength, balance activities as able   Consulted and Agree with Plan of Care Patient        Problem List Patient Active Problem List   Diagnosis Date Noted  . Neurologic abnormality 11/19/2015  . Altered mental state 11/19/2015  . Hyperkalemia 11/19/2015  . Anxiety 11/19/2015  . Insomnia 11/19/2015  . ESRD (end stage renal disease) on dialysis ([place])   . Gait instability   . Stroke-like symptom 11/16/2015  . Vestibular neuritis 11/16/2015  . 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 Dizziness 05/09/2015  . Ataxia 05/09/2015  . H/O: CVA (cerebrovascular accident) 05/09/2015  . Left facial numbness 05/09/2015  . Left leg numbness 05/09/2015  . Hyperlipidemia   . CHF (congestive heart failure) ([place])   . Congestive heart disease ([place])   . Shortness of breath 04/01/2013  . Volume overload 04/01/2013  . HTN (hypertension) 04/01/2013  . Cholecystitis, acute 11/07/2012  . ESRD (end stage renal disease) ([place]) 11/07/2012  . GERD (gastroesophageal reflux disease) 11/07/2012  . Anemia in CKD (chronic kidney disease) 11/07/2012    Cameron Sprang, PT, MPT [facility] [address] [place], [place], [address] Phone: [phone]   Fax:  [phone] 12/13/2015, 4:00  PM  Name: [name] MRN: [id number] Date of Birth: March 09, 1951

## 2015-12-14 DIAGNOSIS — N2581 Secondary hyperparathyroidism of renal origin: Secondary | ICD-10-CM | POA: Diagnosis not present

## 2015-12-14 DIAGNOSIS — N186 End stage renal disease: Secondary | ICD-10-CM | POA: Diagnosis not present

## 2015-12-14 DIAGNOSIS — Z992 Dependence on renal dialysis: Secondary | ICD-10-CM | POA: Diagnosis not present

## 2015-12-14 DIAGNOSIS — D509 Iron deficiency anemia, unspecified: Secondary | ICD-10-CM | POA: Diagnosis not present

## 2015-12-14 DIAGNOSIS — T8612 Kidney transplant failure: Secondary | ICD-10-CM | POA: Diagnosis not present

## 2015-12-14 DIAGNOSIS — D631 Anemia in chronic kidney disease: Secondary | ICD-10-CM | POA: Diagnosis not present

## 2015-12-15 DIAGNOSIS — K922 Gastrointestinal hemorrhage, unspecified: Secondary | ICD-10-CM

## 2015-12-15 HISTORY — DX: Gastrointestinal hemorrhage, unspecified: K92.2

## 2015-12-16 IMAGING — MR MR MRA HEAD W/O CM
9 of 12 series · 32 of 48 positions shown · non-contrast
Comparison: CT head 05/09/2015

CLINICAL DATA: TIA. Left facial numbness. Left arm and leg numbness
and tingling. Dizziness. Leaning to left.

EXAM:
MRI HEAD WITHOUT CONTRAST
MRA HEAD WITHOUT CONTRAST
TECHNIQUE: Multiplanar, multiecho pulse sequences of the brain and surrounding
structures were obtained without intravenous contrast. Angiographic
images of the head were obtained using MRA technique without
contrast.

[Series 3: DWI · axial · 3.0mm · 1.09mm/px · z∈[-85,+42]mm · 6 of 88 slices shown (1 of 4)]
[im 1/88]
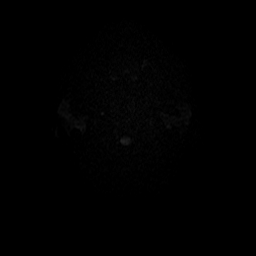
[im 18/88]
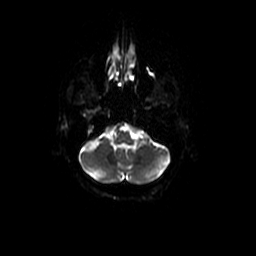
[im 35/88]
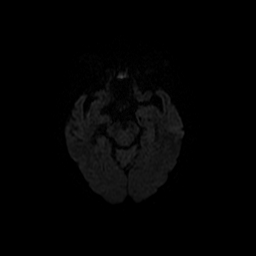
[im 53/88]
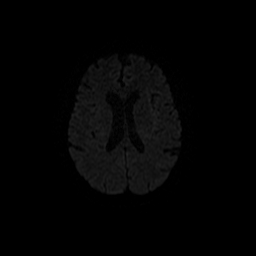
[im 70/88]
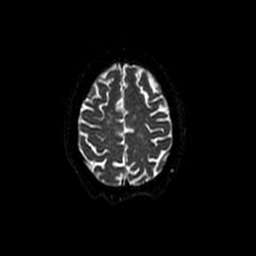
[im 88/88]
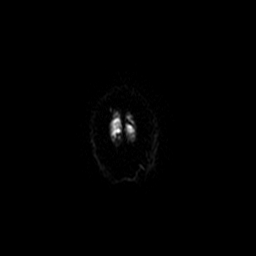

[Series 4: (id) mt fs · axial · 1.4mm · 0.43mm/px · z∈[-86,-38]mm · 5 of 126 slices shown]
[im 1/126]
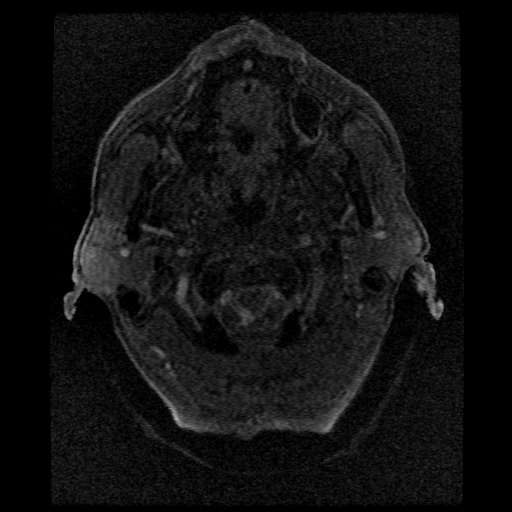
[im 14/126]
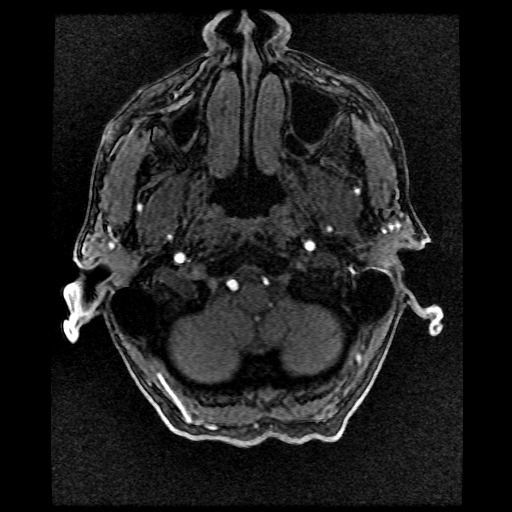
[im 42/126]
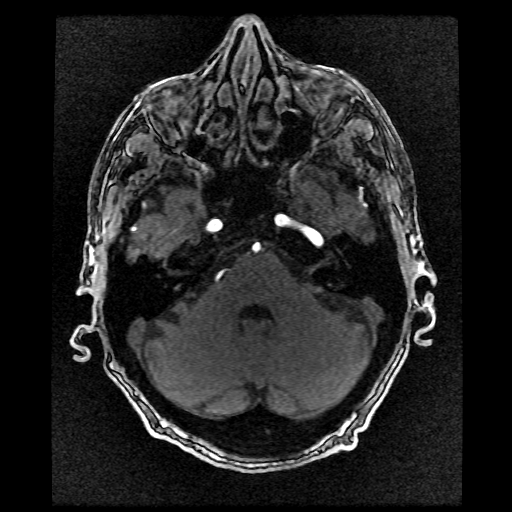
[im 56/126]
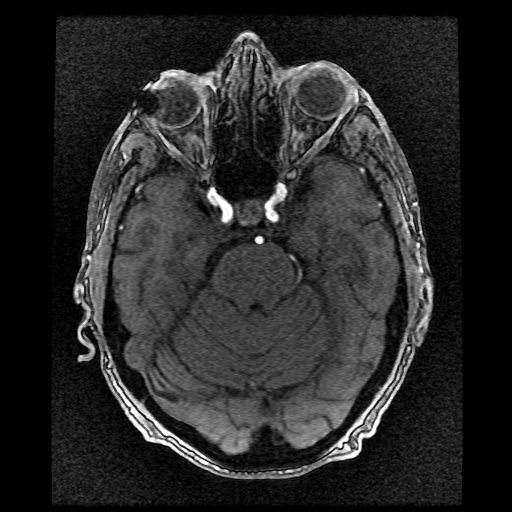
[im 70/126]
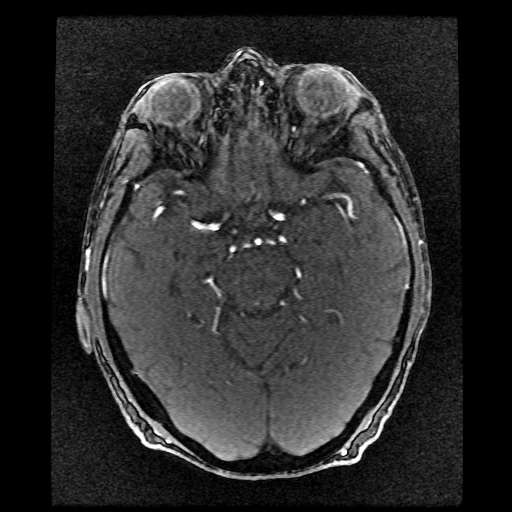

[Series 5: T1 · sagittal · 5.0mm · 0.47mm/px · 2 of 23 slices shown]
[im 1/23]
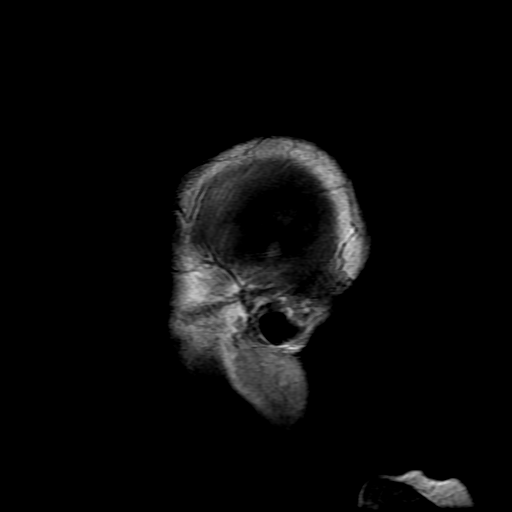
[im 23/23]
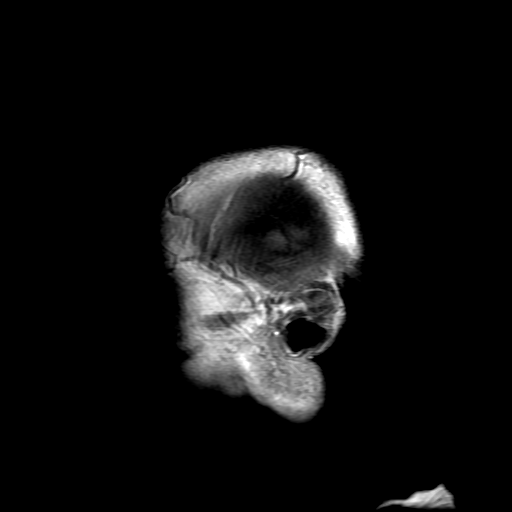

[Series 6: T2 · axial · 5.0mm · 0.43mm/px · z∈[-88,+47]mm · 2 of 24 slices shown (1 of 2)]
[im 1/24]
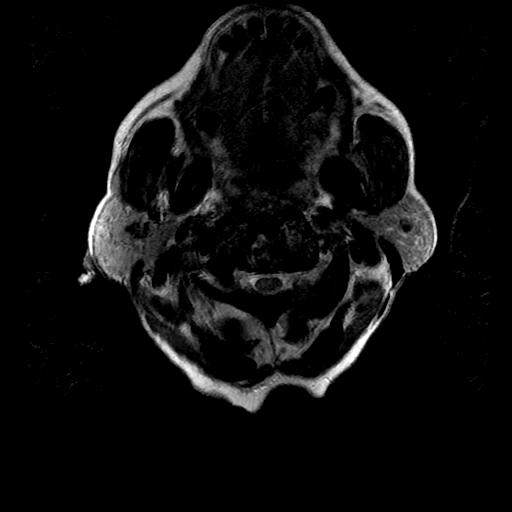
[im 24/24]
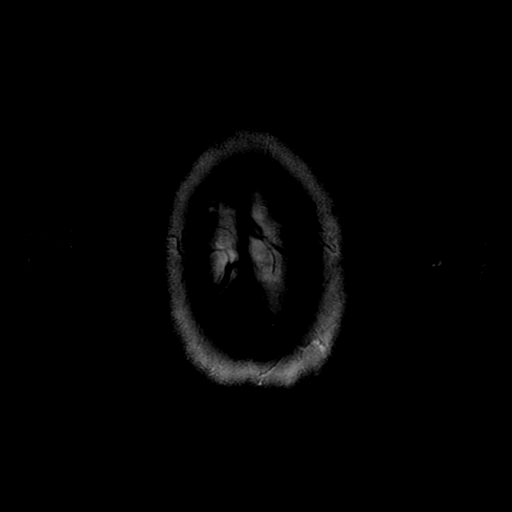

[Series 7: DWI · coronal · 5.0mm · 1.09mm/px · 6 of 66 slices shown (2 of 4)]
[im 1/66]
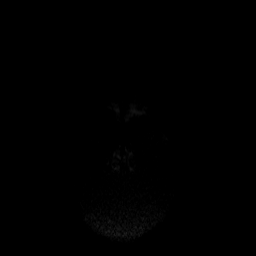
[im 14/66]
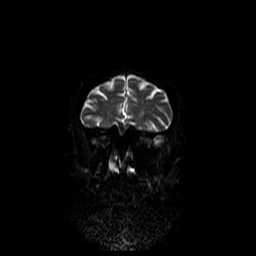
[im 27/66]
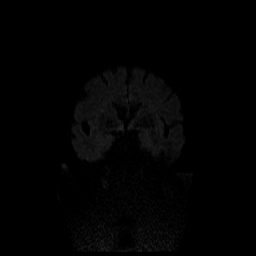
[im 40/66]
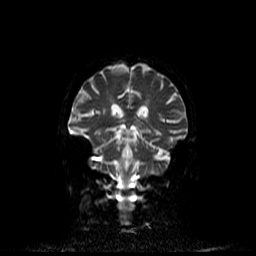
[im 53/66]
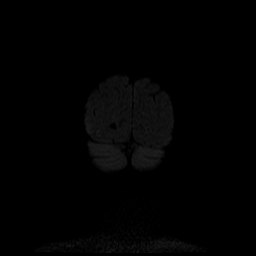
[im 66/66]
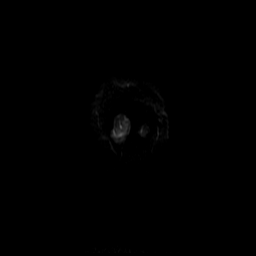

[Series 10: FLAIR · axial · 5.0mm · 0.43mm/px · z∈[-88,+47]mm · 2 of 24 slices shown]
[im 1/24]
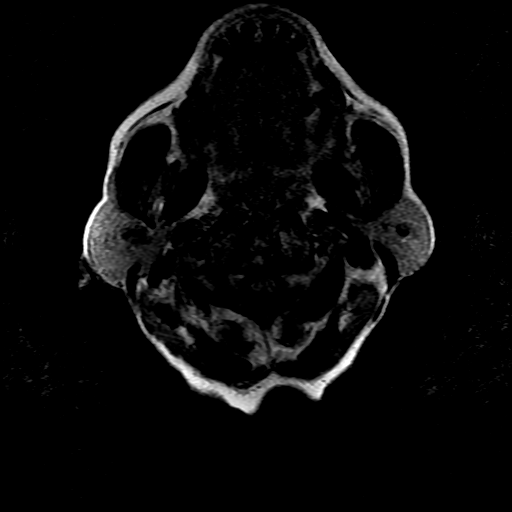
[im 24/24]
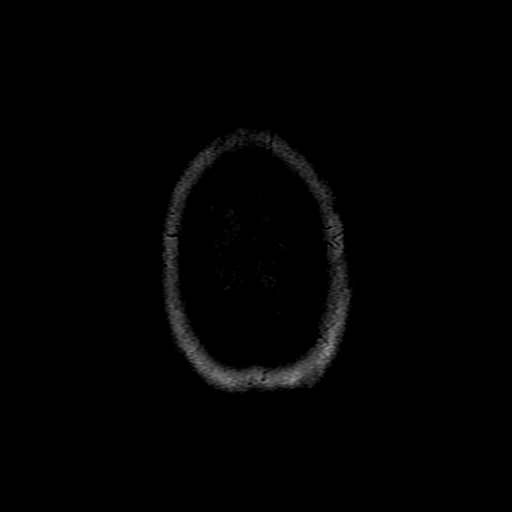

[Series 12: T2 · coronal · 5.0mm · 0.39mm/px · 2 of 25 slices shown (2 of 2)]
[im 1/25]
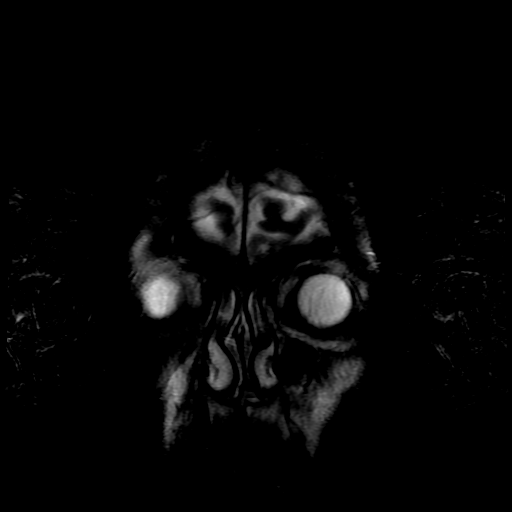
[im 25/25]
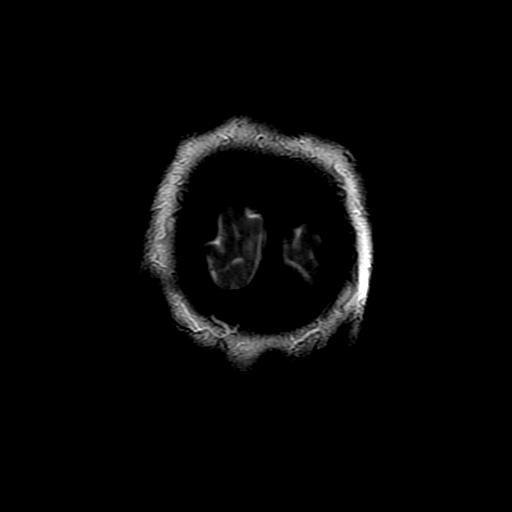

[Series 300: DWI · axial · 3.0mm · 1.09mm/px · z∈[-85,+42]mm · 4 of 44 slices shown (3 of 4)]
[im 1/44]
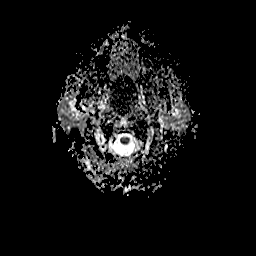
[im 15/44]
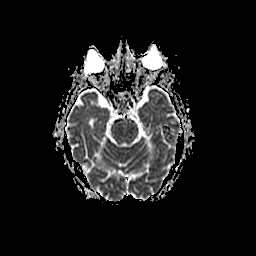
[im 29/44]
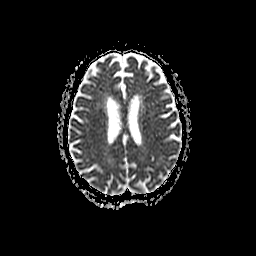
[im 44/44]
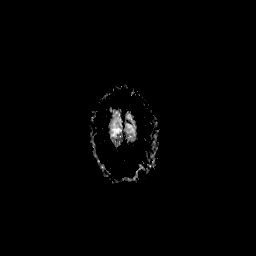

[Series 700: DWI · coronal · 5.0mm · 1.09mm/px · 3 of 33 slices shown (4 of 4)]
[im 1/33]
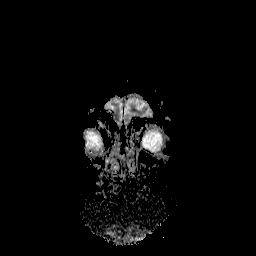
[im 17/33]
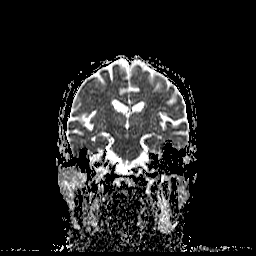
[im 33/33]
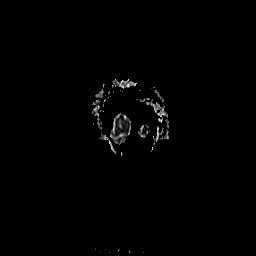

[32 of 48 positions shown; findings below may reference images not displayed]

FINDINGS: MRI HEAD FINDINGS

Negative for acute infarct.

Ventricle size normal. Mild age-appropriate atrophy. Chronic
microvascular ischemic change in the white matter bilaterally. No
cortical infarct.

Pituitary normal in size. Cervical medullary junction normal. Mild
mucosal edema paranasal sinuses.

Multiple areas of micro hemorrhage in the brain including the
thalamus bilaterally. Chronic hemorrhage in the dentate nucleus of
the cerebellum bilaterally. This causes some artifact on
diffusion-weighted imaging.

Negative for mass or edema.  No shift of the midline structures

Image quality degraded by mild motion.

MRA HEAD FINDINGS

Both vertebral arteries patent to the basilar without stenosis. PICA
patent bilaterally. Basilar widely patent. Superior cerebellar and
posterior cerebral arteries appear normal

Cavernous carotid widely patent bilaterally. Anterior and middle
cerebral arteries widely patent without stenosis

Negative for cerebral aneurysm.
IMPRESSION: Negative for acute infarct.

Chronic microvascular ischemic change in the cerebral white matter
bilaterally.

Scattered areas of chronic micro hemorrhage in the brain
bilaterally, question history of uncontrolled hypertension.

Negative MRA circle Willis.

## 2015-12-16 IMAGING — CT CT HEAD W/O CM
1 series · 15 of 29 positions shown, 19 images · non-contrast
Comparison: 07/28/2014

CLINICAL DATA: Dizziness and diaphoresis onset today. Left-sided
paresthesias

EXAM:
CT HEAD WITHOUT CONTRAST
TECHNIQUE: Contiguous axial images were obtained from the base of the skull
through the vertex without intravenous contrast.

[Series 2: head 5.0 h30s · axial · 0.40mm/px · z∈[+995,+1125]mm · 15 of 29 slices shown, 19 images]
[im 2/29  brain]
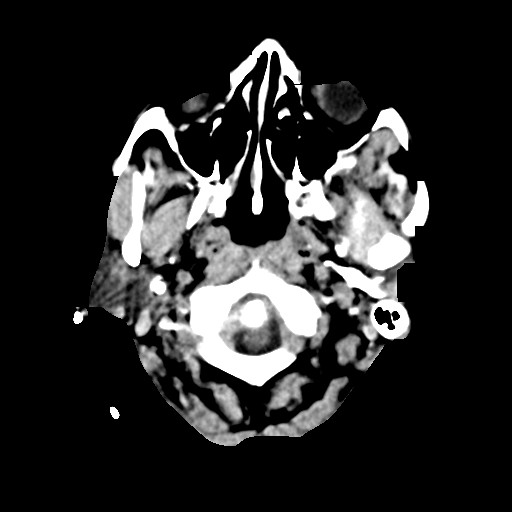
[im 2/29  bone]
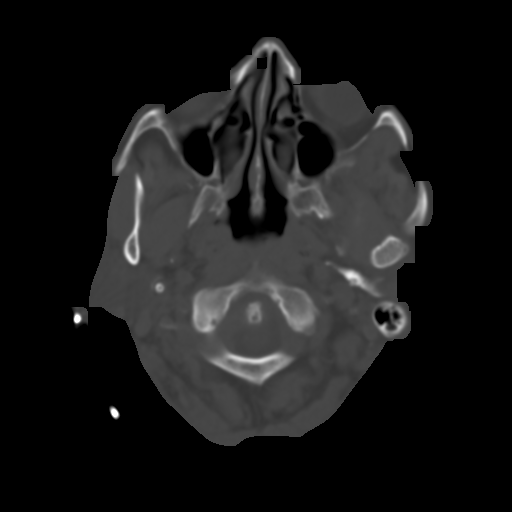
[im 4/29  brain]
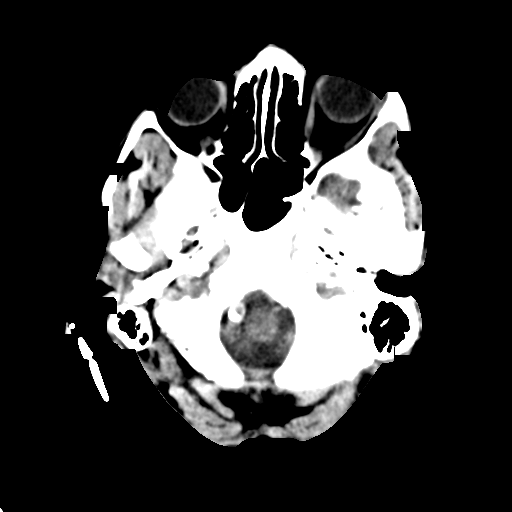
[im 6/29  brain]
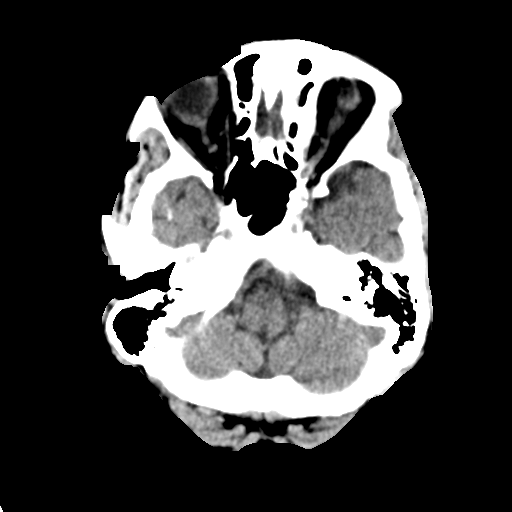
[im 8/29  brain]
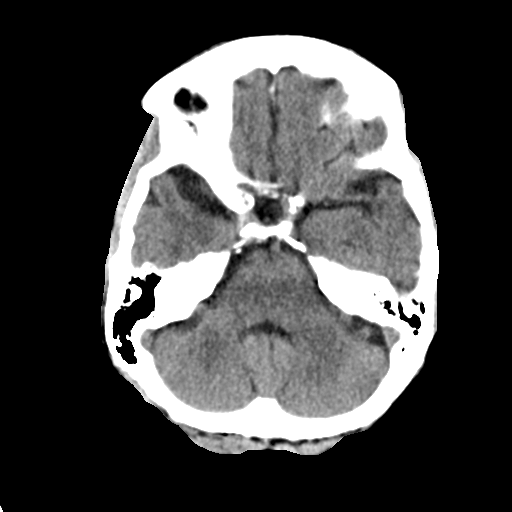
[im 10/29  brain]
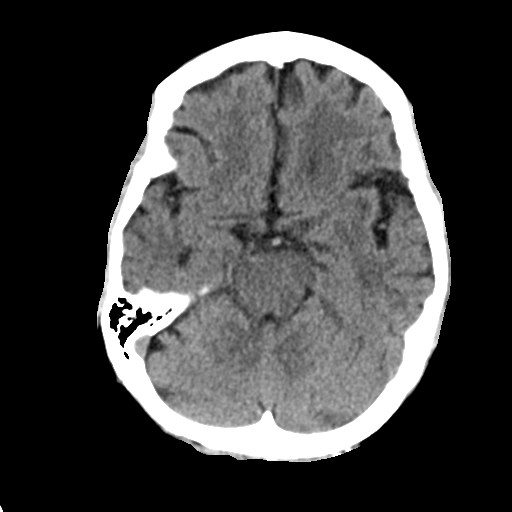
[im 10/29  bone]
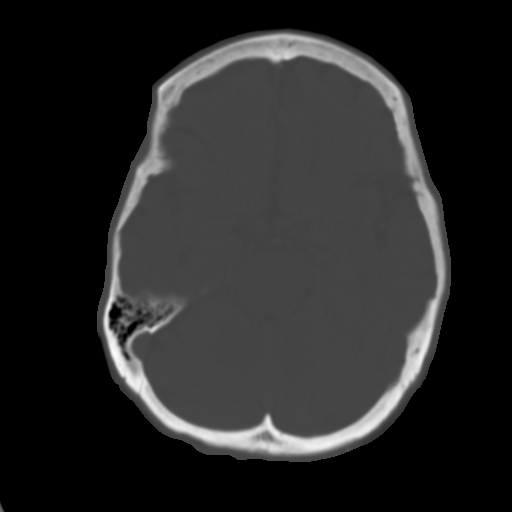
[im 11/29  brain]
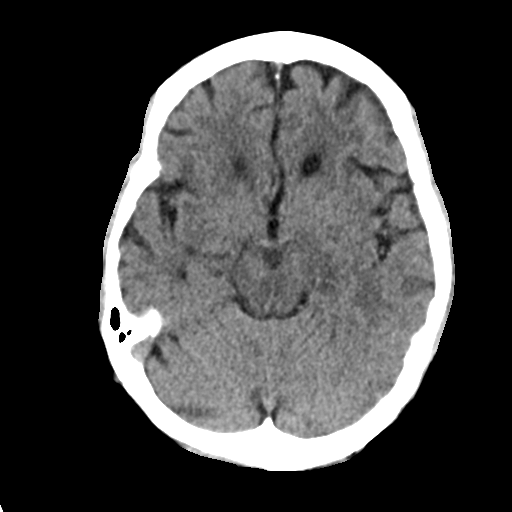
[im 13/29  brain]
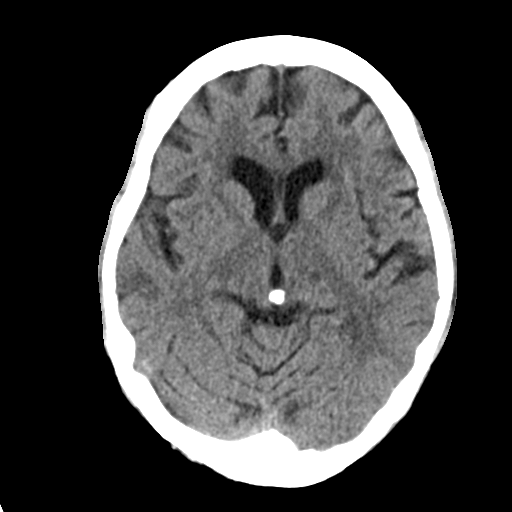
[im 15/29  brain]
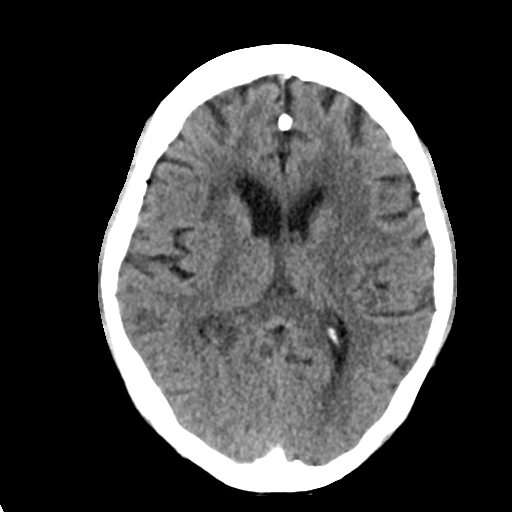
[im 17/29  brain]
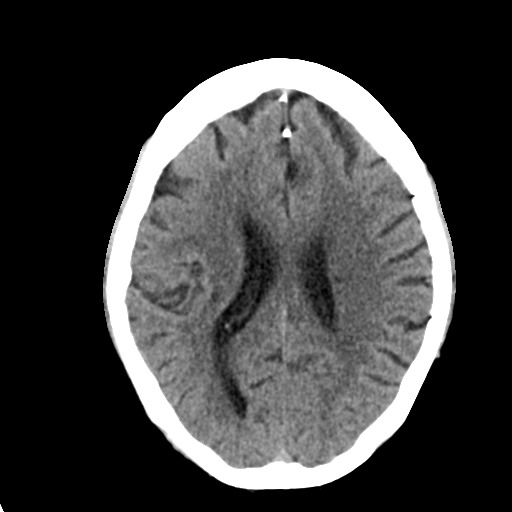
[im 17/29  bone]
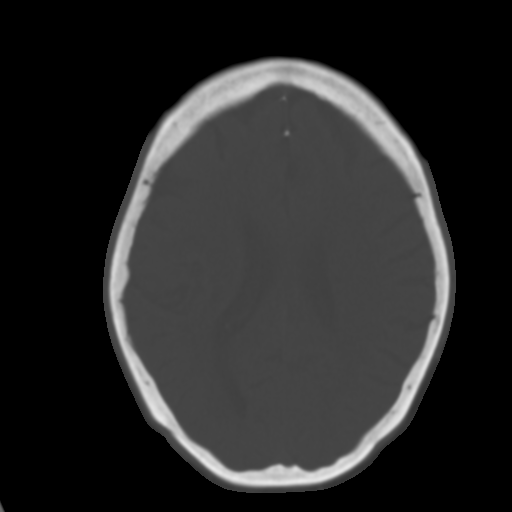
[im 19/29  brain]
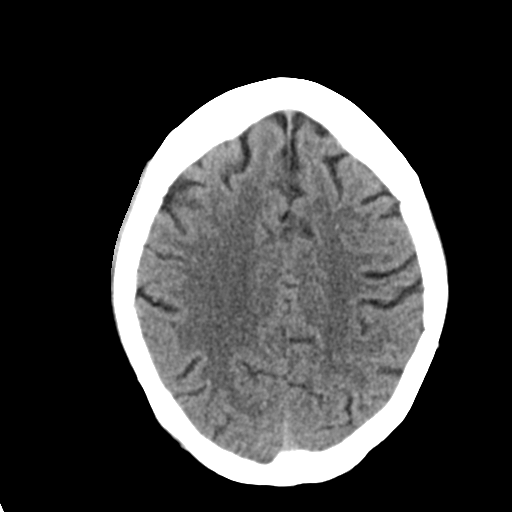
[im 20/29  brain]
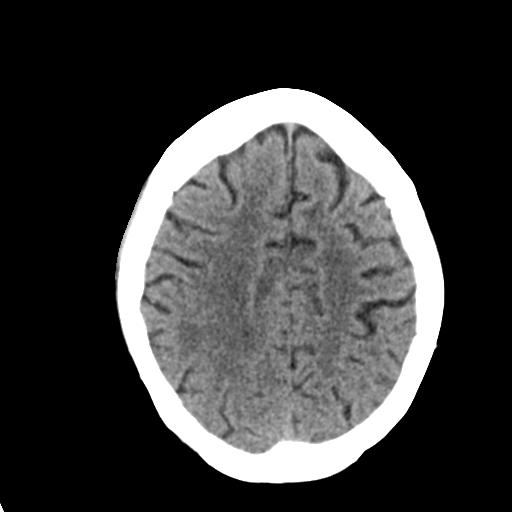
[im 22/29  brain]
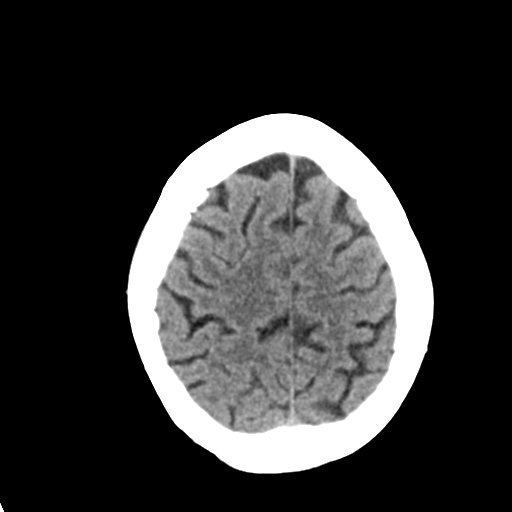
[im 24/29  brain]
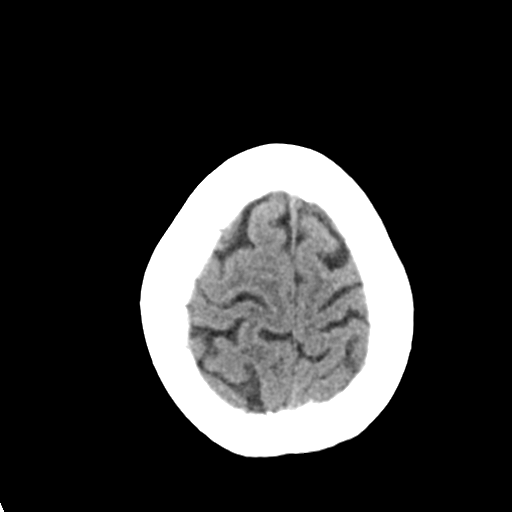
[im 24/29  bone]
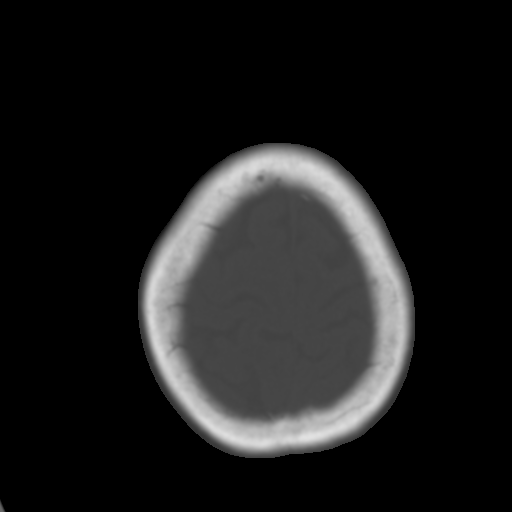
[im 26/29  brain]
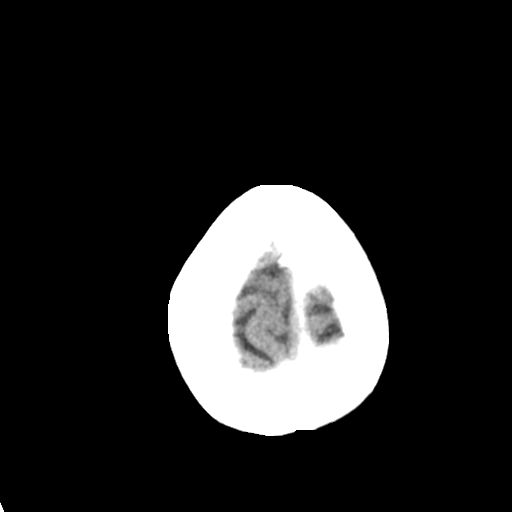
[im 28/29  brain]
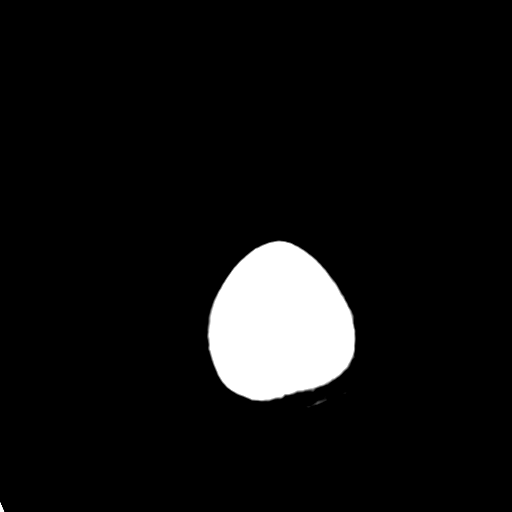

[15 of 29 positions shown; findings below may reference images not displayed]

FINDINGS: Skull and Sinuses:Stable widening of the predental space with
moderate canal stenosis. This is new from 1227 cervical spine MRI.
No acute fracture or destructive process. No sinusitis.

Orbits: Bilateral cataract resection.  No acute findings.

Brain: No evidence of acute infarction, hemorrhage, hydrocephalus,
or mass lesion/mass effect.

There is chronic small vessel disease with patchy ischemic gliosis
in the bilateral cerebral white matter. There is a new but chronic
appearing lacunar infarct in the right corona radiata. A small
ischemic insult in the left thalamus is chronic.
IMPRESSION: 1. No acute findings.
2. Chronic small vessel disease. A lacunar infarct in the right
corona radiata has developed since 07/28/2014 but appears chronic.
3. Predental widening and upper cervical canal stenosis, new from
cervical spine MRI 08/12/2005. If there is chronic neck pain this
could be evaluated with outpatient cervical spine imaging

## 2015-12-17 DIAGNOSIS — D509 Iron deficiency anemia, unspecified: Secondary | ICD-10-CM | POA: Diagnosis not present

## 2015-12-17 DIAGNOSIS — Z23 Encounter for immunization: Secondary | ICD-10-CM | POA: Diagnosis not present

## 2015-12-17 DIAGNOSIS — N2581 Secondary hyperparathyroidism of renal origin: Secondary | ICD-10-CM | POA: Diagnosis not present

## 2015-12-17 DIAGNOSIS — D631 Anemia in chronic kidney disease: Secondary | ICD-10-CM | POA: Diagnosis not present

## 2015-12-17 DIAGNOSIS — N186 End stage renal disease: Secondary | ICD-10-CM | POA: Diagnosis not present

## 2015-12-18 ENCOUNTER — Ambulatory Visit: Payer: Medicare Other | Admitting: Physical Therapy

## 2015-12-19 DIAGNOSIS — N2581 Secondary hyperparathyroidism of renal origin: Secondary | ICD-10-CM | POA: Diagnosis not present

## 2015-12-19 DIAGNOSIS — N186 End stage renal disease: Secondary | ICD-10-CM | POA: Diagnosis not present

## 2015-12-19 DIAGNOSIS — Z23 Encounter for immunization: Secondary | ICD-10-CM | POA: Diagnosis not present

## 2015-12-19 DIAGNOSIS — D631 Anemia in chronic kidney disease: Secondary | ICD-10-CM | POA: Diagnosis not present

## 2015-12-19 DIAGNOSIS — D509 Iron deficiency anemia, unspecified: Secondary | ICD-10-CM | POA: Diagnosis not present

## 2015-12-20 ENCOUNTER — Encounter: Payer: Self-pay | Admitting: Surgery

## 2015-12-20 ENCOUNTER — Encounter: Payer: Self-pay | Admitting: Rehabilitation

## 2015-12-20 ENCOUNTER — Ambulatory Visit: Payer: Medicare Other | Attending: Internal Medicine | Admitting: Rehabilitation

## 2015-12-20 ENCOUNTER — Ambulatory Visit (INDEPENDENT_AMBULATORY_CARE_PROVIDER_SITE_OTHER): Payer: Medicare Other | Admitting: Surgery

## 2015-12-20 VITALS — BP 132/73 | HR 74 | Temp 97.6°F | Resp 16 | Ht 62.0 in | Wt 127.0 lb

## 2015-12-20 DIAGNOSIS — N186 End stage renal disease: Secondary | ICD-10-CM

## 2015-12-20 DIAGNOSIS — R269 Unspecified abnormalities of gait and mobility: Secondary | ICD-10-CM | POA: Diagnosis not present

## 2015-12-20 DIAGNOSIS — R29898 Other symptoms and signs involving the musculoskeletal system: Secondary | ICD-10-CM | POA: Diagnosis not present

## 2015-12-20 DIAGNOSIS — Z992 Dependence on renal dialysis: Secondary | ICD-10-CM

## 2015-12-20 NOTE — Addendum Note (Signed)
Addended by: Dorthula Rue L on: 12/20/2015 01:53 PM   Modules accepted: Orders

## 2015-12-20 NOTE — Therapy (Signed)
[facility] [address] [place], [place], [address] Phone: [phone]   Fax:  [phone]  Physical Therapy Treatment  Patient Details  Name: [name] MRN: [id number] Date of Birth: September 05, 1951 Referring Provider: Dr. Delfina Redwood  Encounter Date: 12/20/2015      PT End of Session - 12/20/15 1535    Visit Number 6   Number of Visits 9   Date for PT Re-Evaluation 01/26/16   Authorization Type Medicare-G-code every 10th visit   PT Start Time 1530   PT Stop Time 1615   PT Time Calculation (min) 45 min   Equipment Utilized During Treatment Gait belt   Activity Tolerance Patient limited by fatigue   Behavior During Therapy --  fatigued, less requests to sit to rest today      Past Medical History  Diagnosis Date  . Anxiety   . Blood transfusion   . Depression   . GERD (gastroesophageal reflux disease)   . Hyperlipidemia   . Hypertension   . Neuromuscular disorder (HCC)     neuropathy hand and legs  . Weight loss, unintentional   . Adenomatous polyp of colon 10/2010  . Diverticula, colon   . CHF (congestive heart failure) ([place])   . 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  Had deceased donor kidney transplant in 1996.  Had some early rejection then stable function for years, then had slow decline of function and went back on hemodialysis in 2012.  Gets HD TTS schedule at [facility] on [facility] still using L forearm AVF.     Marland Kitchen Anemia in CKD (chronic kidney disease) 11/07/2012  . Arthritis   . Osteoporosis   . Stroke [facility])     TIA  . Pneumonia   . Headache   . Constipation     Past Surgical History  Procedure Laterality Date  . Kidney transplant  1996  . Back surgery    . Cervical fusion    . Av fistula placement      for dialysis  . Cholecystectomy  12/02/2012    Procedure: LAPAROSCOPIC CHOLECYSTECTOMY WITH  INTRAOPERATIVE CHOLANGIOGRAM;  Surgeon: Edward Jolly, MD;  Location: [facility];  Service: General;  Laterality: N/A;  . Eye surgery Bilateral     cataract surgery  . Av fistula placement Left 11/22/2015    Procedure: ARTERIOVENOUS (AV) FISTULA CREATION-LEFT BRACHIOCEPHALIC;  Surgeon: Serafina Mitchell, MD;  Location: [facility] OR;  Service: Vascular;  Laterality: Left;  . Resection of arteriovenous fistula aneurysm Left 11/22/2015    Procedure: RESECTION OF LEFT RADIOCEPHALIC FISTULA ANEURYSM ;  Surgeon: Serafina Mitchell, MD;  Location: [facility];  Service: Vascular;  Laterality: Left;    There were no vitals filed for this visit.  Visit Diagnosis:  Weakness of both lower extremities  Abnormality of gait      Subjective Assessment - 12/20/15 1535    Subjective "I've been doing most of my exercises."     Pertinent History Pt reported past TIAs   Patient Stated Goals Pt's goal for therapy is to be able to maneuver legs independently.   Currently in Pain? No/denies            NMR:  Addressed high level balance and gait with gait around track x 345' with varying speeds, stopping and turning, performing head turns side to side and up/down.  Note mild unsteadiness (without use  of AD) but no overt LOB.  Transitioned to high level balance while in // bars on large rocker board; feet apart maintaining balance x 30 secs, feet apart EO with head turns side/side x 15 reps and up/down x 15 reps. Progressed to feet together, EC maintaining balance x 30 secs, EC with head turns side/side x 10 reps and up/down x 10 reps.  Note increased difficulty eliciting hip strategy when EC.  Transitioned to performing sit<>stand while feet placed on foam balance beam x 10 reps with 5 second hold while standing to further elicit hip strategy.  Cues for improved technique when sitting for more controlled descent.  Progressed to standing on ramp on compliant mat for increased balance challenge performing marching in place x 20  reps>marching with opposite head turns x 10 reps.  Min A for mat activities due to intermittent LOB.  Performed tandem walking x 3 reps forward and back on compliant surface.  Note much improved use of hip strategy to correct balance during activity.  Questioned pt regarding compliance with HEP and SLS activity.  She states she usually does "the easy ones" therefore provided education on importance of performing all exercises but that she could split them up if needed for increased compliance.  Performed SLS in corner with single UE support (three fingers) x 2 sets of 20 secs on each LE.    TE:  Ended session with seated nustep task x 5 mins at level 3 resistance with BUE/LEs in order to address overall strength and endurance.  Pt given cues for trying to go continuously until at half way point then resting if needed before continuing last half.  Pt able to make it to 4 min mark before taking break and continued following short break.                       PT Education - 12/20/15 1535    Education provided Yes   Education Details compliance with HEP   Person(s) Educated Patient   Methods Explanation   Comprehension Verbalized understanding             PT Long Term Goals - 11/28/15 2134    PT LONG TERM GOAL #1   Title Pt will be independent with HEP for improved balance, gait and transfers.  TARGET 12/27/15   Time 4   Period Weeks   Status New   PT LONG TERM GOAL #2   Title Pt will improve TUG score to less than or equal to 13.5 seconds for decreased fall risk.   Time 4   Period Weeks   Status New   PT LONG TERM GOAL #3   Title Pt will improve gait velocity to at least 2.3 ft/sec for improved gait efficiency and safety.   Time 4   Period Weeks   Status New   PT LONG TERM GOAL #4   Title Pt will improve Dynamic Gait Index score to less than or equal to 12/24 for decreased fall risk.   Time 4   Status New   PT LONG TERM GOAL #5   Title Pt will verbalize  understanding of CVA education.   Time 4   Period Weeks   Status New               Plan - 12/20/15 1535    Clinical Impression Statement Skilled session focused on high level balance challenges and BLE strengthening and endurance.  Tolerated well with continued need  for intermittent rest breaks.     Pt will benefit from skilled therapeutic intervention in order to improve on the following deficits Abnormal gait;Decreased activity tolerance;Decreased balance;Decreased mobility;Decreased strength;Difficulty walking   Rehab Potential Good   Clinical Impairments Affecting Rehab Potential Pt on dialysis 3x/wk   PT Frequency 2x / week   PT Duration 4 weeks  plus eval   PT Treatment/Interventions ADLs/Self Care Home Management;Therapeutic exercise;Therapeutic activities;Functional mobility training;Gait training;DME Instruction;Balance training;Neuromuscular re-education;Patient/family education   PT Next Visit Plan Review standing exercises for compliance, progress gait, strength, balance activities as able   Consulted and Agree with Plan of Care Patient        Problem List Patient Active Problem List   Diagnosis Date Noted  . Neurologic abnormality 11/19/2015  . Altered mental state 11/19/2015  . Hyperkalemia 11/19/2015  . Anxiety 11/19/2015  . Insomnia 11/19/2015  . ESRD (end stage renal disease) on dialysis ([place])   . Gait instability   . Stroke-like symptom 11/16/2015  . Vestibular neuritis 11/16/2015  . Stroke (cerebrum) ([place]) 11/16/2015  . Dizziness 05/09/2015  . Ataxia 05/09/2015  . H/O: CVA (cerebrovascular accident) 05/09/2015  . Left facial numbness 05/09/2015  . Left leg numbness 05/09/2015  . Hyperlipidemia   . CHF (congestive heart failure) ([place])   . Congestive heart disease ([place])   . Shortness of breath 04/01/2013  . Volume overload 04/01/2013  . HTN (hypertension) 04/01/2013  . Cholecystitis, acute 11/07/2012  . ESRD (end stage renal disease) ([place])  11/07/2012  . GERD (gastroesophageal reflux disease) 11/07/2012  . Anemia in CKD (chronic kidney disease) 11/07/2012    Cameron Sprang, PT, MPT [facility] [address] [place], [place], [address] Phone: [phone]   Fax:  [phone] 12/20/2015, 4:24 PM  Name: [name] MRN: [id number] Date of Birth: 1951-06-17

## 2015-12-20 NOTE — Progress Notes (Signed)
Patient name: [name] MRN: [id number] DOB: 21-Jan-1951 Sex: female     Chief Complaint  Patient presents with  . Re-evaluation    f/u    HISTORY OF PRESENT ILLNESS:  The patient is back for follow-up.  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  She reports no issues.  She denies steal syndrome.  Past Medical History  Diagnosis Date  . Anxiety   . Blood transfusion   . Depression   . GERD (gastroesophageal reflux disease)   . Hyperlipidemia   . Hypertension   . Neuromuscular disorder (HCC)     neuropathy hand and legs  . Weight loss, unintentional   . Adenomatous polyp of colon 10/2010  . Diverticula, colon   . CHF (congestive heart failure) ([place])   . 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  Had deceased donor kidney transplant in 1996.  Had some early rejection then stable function for years, then had slow decline of function and went back on hemodialysis in 2012.  Gets HD TTS schedule at [facility] on [facility] still using L forearm AVF.     Marland Kitchen Anemia in CKD (chronic kidney disease) 11/07/2012  . Arthritis   . Osteoporosis   . Stroke [facility])     TIA  . Pneumonia   . Headache   . Constipation     Past Surgical History  Procedure Laterality Date  . Kidney transplant  1996  . Back surgery    . Cervical fusion    . Av fistula placement      for dialysis  . Cholecystectomy  12/02/2012    Procedure: LAPAROSCOPIC CHOLECYSTECTOMY WITH INTRAOPERATIVE CHOLANGIOGRAM;  Surgeon: Edward Jolly, MD;  Location: [facility];  Service: General;  Laterality: N/A;  . Eye surgery Bilateral     cataract surgery  . Av fistula placement Left 11/22/2015    Procedure: ARTERIOVENOUS (AV) FISTULA CREATION-LEFT BRACHIOCEPHALIC;  Surgeon: Serafina Mitchell, MD;  Location: [facility] OR;  Service: Vascular;  Laterality:  Left;  . Resection of arteriovenous fistula aneurysm Left 11/22/2015    Procedure: RESECTION OF LEFT RADIOCEPHALIC FISTULA ANEURYSM ;  Surgeon: Serafina Mitchell, MD;  Location: [facility];  Service: Vascular;  Laterality: Left;    Social History   Social History  . Marital Status: Widowed    Spouse Name: N/A  . Number of Children: 2  . Years of Education: N/A   Occupational History  . Not on file.   Social History Main Topics  . Smoking status: Former Smoker    Types: Cigarettes    Quit date: 12/31/1991  . Smokeless tobacco: Never Used  . Alcohol Use: No  . Drug Use: No  . Sexual Activity: No   Other Topics Concern  . Not on file   Social History Narrative    Family History  Problem Relation Age of Onset  . Colon cancer Brother   . Cancer Brother   . Coronary artery disease Mother 61  . Hyperlipidemia Mother   . Hypertension Mother   . Esophageal cancer Neg Hx   . Stomach cancer Neg Hx   . Rectal cancer Neg Hx     Allergies as of 12/20/2015 - Review Complete 12/20/2015  Allergen Reaction Noted  . Sulfa antibiotics Other (See Comments) 11/06/2012    Current Outpatient Prescriptions  on File Prior to Visit  Medication Sig Dispense Refill  . acetaminophen (TYLENOL) 325 MG tablet Take 650 mg by mouth every 6 (six) hours as needed for mild pain.    Marland Kitchen allopurinol (ZYLOPRIM) 300 MG tablet Take 150 mg by mouth at bedtime.    . ALPRAZolam (XANAX) 0.25 MG tablet Take 0.25 mg by mouth 2 (two) times daily.     Marland Kitchen aspirin EC 325 MG tablet Take 325 mg by mouth at bedtime.    Marland Kitchen b complex-vitamin c-folic acid (NEPHRO-VITE) 0.8 MG TABS tablet Take 1 tablet by mouth at bedtime.    . calcium carbonate (TUMS) 500 MG chewable tablet Chew 500 mg by mouth 3 (three) times daily.    . cinacalcet (SENSIPAR) 30 MG tablet Take 30 mg by mouth daily.    Marland Kitchen doxazosin (CARDURA) 8 MG tablet Take 1 tablet (8 mg total) by mouth 2 (two) times daily as needed (hypertension).    Marland Kitchen HYDROcodone-acetaminophen  (NORCO) 5-325 MG tablet Take 1 tablet by mouth every 6 (six) hours as needed for moderate pain. 30 tablet 0  . HYDROcodone-acetaminophen (NORCO) 5-325 MG tablet Take 1 tablet by mouth every 6 (six) hours as needed for moderate pain. 30 tablet 0  . labetalol (NORMODYNE) 300 MG tablet Take 300 mg by mouth 2 (two) times daily.    Marland Kitchen lactulose (CHRONULAC) 10 GM/15ML solution Take 20 g by mouth daily as needed for mild constipation.    Marland Kitchen losartan (COZAAR) 100 MG tablet Take 100 mg by mouth at bedtime.     . promethazine (PHENERGAN) 25 MG tablet Take 25 mg by mouth every 6 (six) hours as needed for nausea or vomiting.    . simvastatin (ZOCOR) 10 MG tablet Take 10 mg by mouth at bedtime.     . traMADol (ULTRAM) 50 MG tablet Take 50 mg by mouth every 12 (twelve) hours as needed for moderate pain.   0  . zolpidem (AMBIEN) 10 MG tablet Take 10 mg by mouth at bedtime.    . clindamycin (CLEOCIN) 150 MG capsule Take 2 capsules (300 mg total) by mouth 3 (three) times daily. (Patient not taking: Reported on 12/10/2015) 42 capsule 0  . sevelamer carbonate (RENVELA) 800 MG tablet Take 1,600 mg by mouth 3 (three) times daily with meals. Reported on 12/20/2015     No current facility-administered medications on file prior to visit.       PHYSICAL EXAMINATION:   Vital signs are  Filed Vitals:   12/20/15 1010  BP: 132/73  Pulse: 74  Temp: 97.6 F (36.4 C)  Resp: 16  Height: 5\' 2"  (1.575 m)  Weight: 127 lb (57.607 kg)  SpO2: 100%   Body mass index is 23.22 kg/(m^2). General: The patient appears their stated age.  palpable thrill within left brachiocephalic fistula which is tortuous.  The incision at the wrist has completely healed.   Diagnostic Studies  none  Assessment:  end-stage renal disease Plan:  there is an excellent thrill in her fistula.  I'll have her follow-up in 2 months with a duplex for last evaluation prior to cannulation of her fistula  V. Leia Alf, M.D. [facility] Office: [phone] Pager:  [phone]

## 2015-12-21 DIAGNOSIS — Z23 Encounter for immunization: Secondary | ICD-10-CM | POA: Diagnosis not present

## 2015-12-21 DIAGNOSIS — N186 End stage renal disease: Secondary | ICD-10-CM | POA: Diagnosis not present

## 2015-12-21 DIAGNOSIS — N2581 Secondary hyperparathyroidism of renal origin: Secondary | ICD-10-CM | POA: Diagnosis not present

## 2015-12-21 DIAGNOSIS — D631 Anemia in chronic kidney disease: Secondary | ICD-10-CM | POA: Diagnosis not present

## 2015-12-21 DIAGNOSIS — D509 Iron deficiency anemia, unspecified: Secondary | ICD-10-CM | POA: Diagnosis not present

## 2015-12-23 ENCOUNTER — Ambulatory Visit: Payer: Medicare Other | Admitting: Rehabilitation

## 2015-12-24 DIAGNOSIS — N186 End stage renal disease: Secondary | ICD-10-CM | POA: Diagnosis not present

## 2015-12-24 DIAGNOSIS — D631 Anemia in chronic kidney disease: Secondary | ICD-10-CM | POA: Diagnosis not present

## 2015-12-24 DIAGNOSIS — Z23 Encounter for immunization: Secondary | ICD-10-CM | POA: Diagnosis not present

## 2015-12-24 DIAGNOSIS — N2581 Secondary hyperparathyroidism of renal origin: Secondary | ICD-10-CM | POA: Diagnosis not present

## 2015-12-24 DIAGNOSIS — D509 Iron deficiency anemia, unspecified: Secondary | ICD-10-CM | POA: Diagnosis not present

## 2015-12-26 DIAGNOSIS — Z23 Encounter for immunization: Secondary | ICD-10-CM | POA: Diagnosis not present

## 2015-12-26 DIAGNOSIS — N186 End stage renal disease: Secondary | ICD-10-CM | POA: Diagnosis not present

## 2015-12-26 DIAGNOSIS — D509 Iron deficiency anemia, unspecified: Secondary | ICD-10-CM | POA: Diagnosis not present

## 2015-12-26 DIAGNOSIS — N2581 Secondary hyperparathyroidism of renal origin: Secondary | ICD-10-CM | POA: Diagnosis not present

## 2015-12-26 DIAGNOSIS — D631 Anemia in chronic kidney disease: Secondary | ICD-10-CM | POA: Diagnosis not present

## 2015-12-27 ENCOUNTER — Encounter: Payer: Self-pay | Admitting: Rehabilitation

## 2015-12-27 ENCOUNTER — Ambulatory Visit: Payer: Medicare Other | Admitting: Rehabilitation

## 2015-12-27 DIAGNOSIS — R29898 Other symptoms and signs involving the musculoskeletal system: Secondary | ICD-10-CM

## 2015-12-27 DIAGNOSIS — R269 Unspecified abnormalities of gait and mobility: Secondary | ICD-10-CM

## 2015-12-27 NOTE — Therapy (Signed)
[facility] [address] [place], [place], [address] Phone: [phone]   Fax:  [phone]  Physical Therapy Treatment  Patient Details  Name: [name] MRN: [id number] Date of Birth: [birth year] Referring Provider: Dr. Delfina Redwood  Encounter Date: 12/27/2015      PT End of Session - 12/27/15 1540    Visit Number 7   Number of Visits 9   Date for PT Re-Evaluation 01/26/16   Authorization Type Medicare-G-code every 10th visit   PT Start Time 1530   PT Stop Time 1615   PT Time Calculation (min) 45 min   Equipment Utilized During Treatment Gait belt   Activity Tolerance Patient limited by fatigue   Behavior During Therapy --  fatigued, less requests to sit to rest today      Past Medical History  Diagnosis Date  . Anxiety   . Blood transfusion   . Depression   . GERD (gastroesophageal reflux disease)   . Hyperlipidemia   . Hypertension   . Neuromuscular disorder (HCC)     neuropathy hand and legs  . Weight loss, unintentional   . Adenomatous polyp of colon 10/2010  . Diverticula, colon   . CHF (congestive heart failure) ([place])   . 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  Had deceased donor kidney transplant in 1996.  Had some early rejection then stable function for years, then had slow decline of function and went back on hemodialysis in 2012.  Gets HD TTS schedule at [facility] still using L forearm AVF.     Marland Kitchen Anemia in CKD (chronic kidney disease) 11/07/2012  . Arthritis   . Osteoporosis   . Stroke [facility])     TIA  . Pneumonia   . Headache   . Constipation     Past Surgical History  Procedure Laterality Date  . Kidney transplant  1996  . Back surgery    . Cervical fusion    . Av fistula placement      for dialysis  . Cholecystectomy  12/02/2012    Procedure: LAPAROSCOPIC CHOLECYSTECTOMY WITH  INTRAOPERATIVE CHOLANGIOGRAM;  Surgeon: Edward Jolly, MD;  Location: [place];  Service: General;  Laterality: N/A;  . Eye surgery Bilateral     cataract surgery  . Av fistula placement Left 11/22/2015    Procedure: ARTERIOVENOUS (AV) FISTULA CREATION-LEFT BRACHIOCEPHALIC;  Surgeon: Serafina Mitchell, MD;  Location[facility] OR;  Service: Vascular;  Laterality: Left;  . Resection of arteriovenous fistula aneurysm Left 11/22/2015    Procedure: RESECTION OF LEFT RADIOCEPHALIC FISTULA ANEURYSM ;  Surgeon: Serafina Mitchell, MD;  Location: [place];  Service: Vascular;  Laterality: Left;    There were no vitals filed for this visit.  Visit Diagnosis:  Weakness of both lower extremities  Abnormality of gait      Subjective Assessment - 12/27/15 1540    Subjective "I'm not feeling too good today."    Pertinent History Pt reported past TIAs   Patient Stated Goals Pt's goal for therapy is to be able to maneuver legs independently.   Currently in Pain? No/denies             NMR:  Had pt stand with single UE support at counter while standing on red gym mat, worked on marching with high knees in slow pattern forwards and then backwards x 10' x 3 reps.  Progressed to tandem walking on same surface with UE support x 3 reps, ending with combined marching and tandem stepping forwards and backwards.  Requires seated rest breaks between each task with moderate c/o fatigue.  Progressed to balance activity in // bars with RLE stabilizing on foam balance beam while LLE slowing and lightly tapped to cone on 4" step and back down to floor x 5 w/ single UE support>x 10 reps without UE support and min/guard to min A to maintain balance switching to vice versa with same reps.  Continued with high level balance and gait with gait while tossing ball x 115' with min/guard A and cues to continue both tasks simultaneously.  Then performed gait with head turns side/side x 115' with noted decreased balance and scissoring noted  requiring min A to correct and prevent overt LOB.  Ended with gait with vertical head turns x 115'.  Note pt somewhat more stable with vertical head turns.  Again, needs seated rest breaks throughout gait activities due to fatigue.                      PT Education - 12/27/15 1540    Education provided Yes   Education Details compliance with HEP, safety and continuous use of cane at all times   Person(s) Educated Patient   Methods Explanation   Comprehension Verbalized understanding             PT Long Term Goals - 11/28/15 2134    PT LONG TERM GOAL #1   Title Pt will be independent with HEP for improved balance, gait and transfers.  TARGET 12/27/15   Time 4   Period Weeks   Status New   PT LONG TERM GOAL #2   Title Pt will improve TUG score to less than or equal to 13.5 seconds for decreased fall risk.   Time 4   Period Weeks   Status New   PT LONG TERM GOAL #3   Title Pt will improve gait velocity to at least 2.3 ft/sec for improved gait efficiency and safety.   Time 4   Period Weeks   Status New   PT LONG TERM GOAL #4   Title Pt will improve Dynamic Gait Index score to less than or equal to 12/24 for decreased fall risk.   Time 4   Status New   PT LONG TERM GOAL #5   Title Pt will verbalize understanding of CVA education.   Time 4   Period Weeks   Status New               Plan - 12/27/15 1541    Clinical Impression Statement Skilled session continues to focus on high level balance, BLE strengthening, and overall endurance and activity tolerance.  Continues to need many rest breaks, but tolerates increased activity.    Pt will benefit from skilled therapeutic intervention in order to improve on the following deficits Abnormal gait;Decreased activity tolerance;Decreased balance;Decreased mobility;Decreased strength;Difficulty walking   Rehab Potential Good   Clinical Impairments Affecting Rehab Potential Pt on dialysis 3x/wk   PT Frequency 2x /  week   PT Duration 4 weeks  plus eval   PT Treatment/Interventions ADLs/Self Care Home Management;Therapeutic exercise;Therapeutic activities;Functional mobility training;Gait training;DME Instruction;Balance training;Neuromuscular re-education;Patient/family education   PT Next Visit Plan Review standing exercises for compliance, progress gait, strength, balance activities as able, start checking LTGs   Consulted and Agree with Plan of Care Patient  Problem List Patient Active Problem List   Diagnosis Date Noted  . Neurologic abnormality 11/19/2015  . Altered mental state 11/19/2015  . Hyperkalemia 11/19/2015  . Anxiety 11/19/2015  . Insomnia 11/19/2015  . ESRD (end stage renal disease) on dialysis ([place])   . Gait instability   . Stroke-like symptom 11/16/2015  . Vestibular neuritis 11/16/2015  . 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 Dizziness 05/09/2015  . Ataxia 05/09/2015  . H/O: CVA (cerebrovascular accident) 05/09/2015  . Left facial numbness 05/09/2015  . Left leg numbness 05/09/2015  . Hyperlipidemia   . CHF (congestive heart failure) ([place])   . Congestive heart disease ([place])   . Shortness of breath 04/01/2013  . Volume overload 04/01/2013  . HTN (hypertension) 04/01/2013  . Cholecystitis, acute 11/07/2012  . ESRD (end stage renal disease) ([place]) 11/07/2012  . GERD (gastroesophageal reflux disease) 11/07/2012  . Anemia in CKD (chronic kidney disease) 11/07/2012    Cameron Sprang, PT, MPT [facility] [address] [place], [place], [address] Phone: [phone]   Fax:  [phone] 12/27/2015, 5:04 PM  Name: [name] MRN: [id number] Date of Birth: Oct 10, 1951

## 2015-12-28 DIAGNOSIS — N2581 Secondary hyperparathyroidism of renal origin: Secondary | ICD-10-CM | POA: Diagnosis not present

## 2015-12-28 DIAGNOSIS — Z23 Encounter for immunization: Secondary | ICD-10-CM | POA: Diagnosis not present

## 2015-12-28 DIAGNOSIS — D509 Iron deficiency anemia, unspecified: Secondary | ICD-10-CM | POA: Diagnosis not present

## 2015-12-28 DIAGNOSIS — N186 End stage renal disease: Secondary | ICD-10-CM | POA: Diagnosis not present

## 2015-12-28 DIAGNOSIS — D631 Anemia in chronic kidney disease: Secondary | ICD-10-CM | POA: Diagnosis not present

## 2015-12-30 ENCOUNTER — Ambulatory Visit: Payer: Medicare Other | Admitting: Rehabilitation

## 2015-12-30 ENCOUNTER — Encounter: Payer: Self-pay | Admitting: Rehabilitation

## 2015-12-30 DIAGNOSIS — R29898 Other symptoms and signs involving the musculoskeletal system: Secondary | ICD-10-CM

## 2015-12-30 DIAGNOSIS — I509 Heart failure, unspecified: Secondary | ICD-10-CM | POA: Diagnosis not present

## 2015-12-30 DIAGNOSIS — I10 Essential (primary) hypertension: Secondary | ICD-10-CM | POA: Diagnosis not present

## 2015-12-30 DIAGNOSIS — M8588 Other specified disorders of bone density and structure, other site: Secondary | ICD-10-CM | POA: Diagnosis not present

## 2015-12-30 DIAGNOSIS — E782 Mixed hyperlipidemia: Secondary | ICD-10-CM | POA: Diagnosis not present

## 2015-12-30 DIAGNOSIS — N186 End stage renal disease: Secondary | ICD-10-CM | POA: Diagnosis not present

## 2015-12-30 DIAGNOSIS — R269 Unspecified abnormalities of gait and mobility: Secondary | ICD-10-CM | POA: Diagnosis not present

## 2015-12-30 DIAGNOSIS — D638 Anemia in other chronic diseases classified elsewhere: Secondary | ICD-10-CM | POA: Diagnosis not present

## 2015-12-30 NOTE — Patient Instructions (Addendum)
ABDUCTION: Standing (Active)    Holding onto counter for support. Stand, feet flat. Lift right leg out to side. Repeat on left leg. Do 10 times on each side once a day.   http://gtsc.exer.us/111   Copyright  VHI. All rights reserved.   "I love a Programme researcher, broadcasting/film/video for support, march in place alternating LE's 10 times trying to raise knee as high as you can. Do once a day.  http://gt2.exer.us/345   Copyright  VHI. All rights reserved.   HIP / KNEE: Extension - Standing    Holding counter for support. Squeeze glutes. Raise and lift leg backward. Keep knee straight or slightly bent. Repeat on other leg. Do 10 times on each leg once a day.   Copyright  VHI. All rights reserved.   FLEXION: Standing - Stable (Active)    Holding counter for support. Stand, both feet flat. Bend right knee, bringing heel toward buttocks.  Repeat on left leg and do 10 times on each side once a day.  http://gtsc.exer.us/241   Copyright  VHI. All rights reserved.   SINGLE LIMB STANCE    Hold counter for support. Raise one leg and stand on the other leg counting to 10. Repeat on other leg. Do 3 times on each side once a day.  Copyright  VHI. All rights reserved.   Feet Together, Varied Arm Positions - Eyes Closed    Stand in a corner with chair in front of you for support. Stand with feet together and arms BY YOUR SIDE. Close eyes and visualize upright position. Hold __20__ seconds. Repeat __3__ times per session. Do __1__ sessions per day.  Copyright  VHI. All rights reserved.   Feet Apart (Compliant Surface) Varied Arm Positions - Eyes Closed    Stand in corner with chair in front of you for support. Stand on compliant surface: __stacked pillows or couch cushion______ with feet shoulder width apart and arms BY YOUR SIDE. Close eyes and visualize upright position. Hold_20___ seconds. Repeat __3__ times per session. Do __1__ sessions per  day.  Copyright  VHI. All rights reserved.   Feet Apart (Compliant Surface) Head Motion - Eyes Open    Stand in corner with chair in front of you for support. With eyes open, standing on compliant surface: __stacked pillows or cushion______, feet shoulder width apart, move head slowly: up and down x 10 reps. Repeat moving head side to side x 10 reps.  Do _1___ sessions per day.  Copyright  VHI. All rights reserved.   Feet Together (Compliant Surface) Arm Motion - Eyes Closed    Stand on compliant surface: __Stacked pillows or cushion______ with feet together. Close eyes and keep arms by YOUR SIDE.  Hold for 20 seconds.  Repeat _3___ times per session. Do ___1-2_ sessions per day.  Copyright  VHI. All rights reserved.

## 2015-12-30 NOTE — Therapy (Signed)
[facility] [address] [place], [place], [address] Phone: [phone]   Fax:  [phone]  Physical Therapy Treatment  Patient Details  Name: [name] MRN: [id number] Date of Birth: 11/06/1951 Referring Provider: Dr. Delfina Redwood  Encounter Date: 12/30/2015      PT End of Session - 12/30/15 1353    Visit Number 8   Number of Visits 9   Date for PT Re-Evaluation 01/26/16   Authorization Type Medicare-G-code every 10th visit   PT Start Time 1355   PT Stop Time 1440   PT Time Calculation (min) 45 min   Equipment Utilized During Treatment Gait belt   Activity Tolerance Patient limited by fatigue   Behavior During Therapy --  fatigued, less requests to sit to rest today      Past Medical History  Diagnosis Date  . Anxiety   . Blood transfusion   . Depression   . GERD (gastroesophageal reflux disease)   . Hyperlipidemia   . Hypertension   . Neuromuscular disorder (HCC)     neuropathy hand and legs  . Weight loss, unintentional   . Adenomatous polyp of colon 10/2010  . Diverticula, colon   . CHF (congestive heart failure) ([place])   . 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  Had deceased donor kidney transplant in 1996.  Had some early rejection then stable function for years, then had slow decline of function and went back on hemodialysis in 2012.  Gets HD TTS schedule at [facility] on [facility] still using L forearm AVF.     Marland Kitchen Anemia in CKD (chronic kidney disease) 11/07/2012  . Arthritis   . Osteoporosis   . Stroke [facility])     TIA  . Pneumonia   . Headache   . Constipation     Past Surgical History  Procedure Laterality Date  . Kidney transplant  1996  . Back surgery    . Cervical fusion    . Av fistula placement      for dialysis  . Cholecystectomy  12/02/2012    Procedure: LAPAROSCOPIC CHOLECYSTECTOMY WITH  INTRAOPERATIVE CHOLANGIOGRAM;  Surgeon: Edward Jolly, MD;  Location: [facility];  Service: General;  Laterality: N/A;  . Eye surgery Bilateral     cataract surgery  . Av fistula placement Left 11/22/2015    Procedure: ARTERIOVENOUS (AV) FISTULA CREATION-LEFT BRACHIOCEPHALIC;  Surgeon: Serafina Mitchell, MD;  Location: [facility] OR;  Service: Vascular;  Laterality: Left;  . Resection of arteriovenous fistula aneurysm Left 11/22/2015    Procedure: RESECTION OF LEFT RADIOCEPHALIC FISTULA ANEURYSM ;  Surgeon: Serafina Mitchell, MD;  Location: [facility];  Service: Vascular;  Laterality: Left;    There were no vitals filed for this visit.  Visit Diagnosis:  Weakness of both lower extremities  Abnormality of gait      Subjective Assessment - 12/30/15 1357    Subjective "I'm still having a potassium problem, but I see Dr. Delfina Redwood today to ask him about it."    Pertinent History Pt reported past TIAs   Patient Stated Goals Pt's goal for therapy is to be able to maneuver legs independently.   Currently in Pain? No/denies            TE:  Went over HEP with use of handout to assess LTG.  Pt able to perform with use of handout at independent level, however  requires increased time to look at handout as I suspect that she does not perform the balance exercises very regularly.    Gait:  Assessed LTG for TUG in order to show decreased fall risk.  Pt able to complete TUG without cane at 9.56 secs and with cane at 10.50 secs.   Continue to recommend use of cane for community use.     NMR:  Continue to address ankle and hip strategy while standing on foam mat on incline.  Began with modified tandem stance, holding for 20 secs, however pt needing up to min/mod A to maintain balance (with either R or L LE placed ahead).  Progressed to slow marching to increase balance challenge with modified SLS x 10 reps BLE.  Min/guard for steadying, but overall did very well.  Ended with feet together, EO with head turns side/side  x 10 reps and up/down x 10 reps.  Min/guard for vertical head movements due to tendency to lose balance posteriorly.  Continued with balance strategies to further assess stepping strategy.  Performed backwards walking x 2 reps of 75' and braiding x 2 reps of 47' at min/guard progressing to S level.    Self Care:  Discussed CVA warning signs as well as risk factors for preventing future stroke.  She did very well in recalling when to seek medical services with symptoms and recalled many risk factors in preventing future CVA's.                    PT Education - 12/30/15 1353    Education provided Yes   Education Details compliance with balance HEP, alternating strengthening and balance   Person(s) Educated Patient   Methods Explanation   Comprehension Verbalized understanding             PT Long Term Goals - 12/30/15 1354    PT LONG TERM GOAL #1   Title Pt will be independent with HEP for improved balance, gait and transfers.  TARGET 12/27/15   Baseline met 12/30/15   Time 4   Period Weeks   Status Achieved   PT LONG TERM GOAL #2   Title Pt will improve TUG score to less than or equal to 13.5 seconds for decreased fall risk.   Baseline 9.56 secs without cane, 10.50 w/ cane on 12/30/15   Time 4   Period Weeks   Status Achieved   PT LONG TERM GOAL #3   Title Pt will improve gait velocity to at least 2.3 ft/sec for improved gait efficiency and safety.   Time 4   Period Weeks   Status New   PT LONG TERM GOAL #4   Title Pt will improve Dynamic Gait Index score to less than or equal to 12/24 for decreased fall risk.   Time 4   Status New   PT LONG TERM GOAL #5   Title Pt will verbalize understanding of CVA education.   Baseline met 12/30/15   Time 4   Period Weeks   Status Achieved               Plan - 12/30/15 1353    Clinical Impression Statement Skilled session focused on beginning to check LTG's.  Note pt met 3/5 checked today demonstrating improvement  in gait speed and therefore decreasing fall risk as well as continuing to address high level balance.     Pt will benefit from skilled therapeutic intervention in order to improve on the following deficits Abnormal gait;Decreased  activity tolerance;Decreased balance;Decreased mobility;Decreased strength;Difficulty walking   Rehab Potential Good   Clinical Impairments Affecting Rehab Potential Pt on dialysis 3x/wk   PT Frequency 2x / week   PT Duration 4 weeks  plus eval   PT Treatment/Interventions ADLs/Self Care Home Management;Therapeutic exercise;Therapeutic activities;Functional mobility training;Gait training;DME Instruction;Balance training;Neuromuscular re-education;Patient/family education   PT Next Visit Plan check remaining LTG's, FOTO and D/C [place] I will do D/C!!)   Consulted and Agree with Plan of Care Patient        Problem List Patient Active Problem List   Diagnosis Date Noted  . Neurologic abnormality 11/19/2015  . Altered mental state 11/19/2015  . Hyperkalemia 11/19/2015  . Anxiety 11/19/2015  . Insomnia 11/19/2015  . ESRD (end stage renal disease) on dialysis ([place])   . Gait instability   . Stroke-like symptom 11/16/2015  . Vestibular neuritis 11/16/2015  . 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 Dizziness 05/09/2015  . Ataxia 05/09/2015  . H/O: CVA (cerebrovascular accident) 05/09/2015  . Left facial numbness 05/09/2015  . Left leg numbness 05/09/2015  . Hyperlipidemia   . CHF (congestive heart failure) ([place])   . Congestive heart disease ([place])   . Shortness of breath 04/01/2013  . Volume overload 04/01/2013  . HTN (hypertension) 04/01/2013  . Cholecystitis, acute 11/07/2012  . ESRD (end stage renal disease) ([place]) 11/07/2012  . GERD (gastroesophageal reflux disease) 11/07/2012  . Anemia in CKD (chronic kidney disease) 11/07/2012    Cameron Sprang, PT, MPT [facility] [address] [place], [place],  [address] Phone: [phone]   Fax:  [phone] 12/30/2015, 6:39 PM  Name: NICEY KRAH MRN: [id number] Date of Birth: 1950-12-17

## 2015-12-31 DIAGNOSIS — D631 Anemia in chronic kidney disease: Secondary | ICD-10-CM | POA: Diagnosis not present

## 2015-12-31 DIAGNOSIS — N2581 Secondary hyperparathyroidism of renal origin: Secondary | ICD-10-CM | POA: Diagnosis not present

## 2015-12-31 DIAGNOSIS — D509 Iron deficiency anemia, unspecified: Secondary | ICD-10-CM | POA: Diagnosis not present

## 2015-12-31 DIAGNOSIS — N186 End stage renal disease: Secondary | ICD-10-CM | POA: Diagnosis not present

## 2015-12-31 DIAGNOSIS — Z23 Encounter for immunization: Secondary | ICD-10-CM | POA: Diagnosis not present

## 2016-01-01 ENCOUNTER — Encounter: Payer: Self-pay | Admitting: Physical Therapy

## 2016-01-01 ENCOUNTER — Ambulatory Visit: Payer: Medicare Other | Admitting: Physical Therapy

## 2016-01-01 DIAGNOSIS — R269 Unspecified abnormalities of gait and mobility: Secondary | ICD-10-CM | POA: Diagnosis not present

## 2016-01-01 DIAGNOSIS — R29898 Other symptoms and signs involving the musculoskeletal system: Secondary | ICD-10-CM | POA: Diagnosis not present

## 2016-01-01 NOTE — Therapy (Signed)
Level Surface Normal   Change in Gait Speed Mild Impairment   Gait with Horizontal Head Turns Normal   Gait with Vertical Head Turns Normal   Gait and Pivot Turn Normal   Step Over Obstacle Mild Impairment   Step Around Obstacles Normal   Steps Normal   Total Score 22                     PT Long Term Goals - 2016-01-25 03-04-2219    PT LONG TERM GOAL #1   Title Pt will be independent with HEP for improved balance, gait and transfers.  TARGET 12/27/15   Baseline met 12/30/15   Status Achieved   PT LONG TERM GOAL #2   Title Pt will improve TUG score to less than or equal to 13.5 seconds for decreased fall risk.   Baseline 9.56 secs without cane, 10.50 w/ cane on 12/30/15   Status Achieved   PT LONG TERM GOAL #3   Title Pt will improve gait velocity to at  least 2.3 ft/sec for improved gait efficiency and safety.   Baseline 11.43 secs = 2.86 ft/sec with no device   Status Achieved   PT LONG TERM GOAL #4   Title Pt will improve Dynamic Gait Index score to less than or equal to 12/24 for decreased fall risk.   Baseline 22/24 on DGI on 01-25-16   Status Achieved   PT LONG TERM GOAL #5   Title Pt will verbalize understanding of CVA education.   Status Achieved                 G-Codes - 2016/01/25 04-Mar-2223    Functional Assessment Tool Used gait velocity 2.86 ft/sec; DGI 22/24   Functional Limitation Mobility: Walking and moving around   Mobility: Walking and Moving Around Goal Status [phone]) At least 1 percent but less than 20 percent impaired, limited or restricted   Mobility: Walking and Moving Around Discharge Status [phone]) At least 1 percent but less than 20 percent impaired, limited or restricted      Problem List Patient Active Problem List   Diagnosis Date Noted  . Neurologic abnormality 11/19/2015  . Altered mental state 11/19/2015  . Hyperkalemia 11/19/2015  . Anxiety 11/19/2015  . Insomnia 11/19/2015  . ESRD (end stage renal disease) on dialysis ([place])   . Gait instability   . Stroke-like symptom 11/16/2015  . Vestibular neuritis 11/16/2015  . 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 Dizziness 05/09/2015  . Ataxia 05/09/2015  . H/O: CVA (cerebrovascular accident) 05/09/2015  . Left facial numbness 05/09/2015  . Left leg numbness 05/09/2015  . Hyperlipidemia   . CHF (congestive heart failure) ([place])   . Congestive heart disease ([place])   . Shortness of breath 04/01/2013  . Volume overload 04/01/2013  . HTN (hypertension) 04/01/2013  . Cholecystitis, acute 11/07/2012  . ESRD (end stage renal disease) ([place]) 11/07/2012  . GERD (gastroesophageal reflux disease) 11/07/2012  . Anemia in CKD (chronic kidney disease) 11/07/2012    [name], [name], PT Jan 25, 2016, 10:27 PM  [facility] [address] [place], [place], [address] Phone: [phone]   Fax:  [phone]  Name: [name] MRN: [id number] Date of Birth: October 06, 1951  [facility] [address] [place], [place], [address] Phone: [phone]   Fax:  [phone]  Physical Therapy Treatment  Patient Details  Name: [name] MRN: [id number] Date of Birth: 1951/03/19 Referring Provider: Dr. Delfina Redwood  Encounter Date: 01/01/2016      PT End of Session - 01/01/16 2219    Visit Number 9   Number of Visits 9   Date for PT Re-Evaluation 01/26/16   Authorization Type Medicare-G-code every 10th visit   PT Start Time 1545  pt 15" late for PT appt   PT Stop Time 1614   PT Time Calculation (min) 29 min      Past Medical History  Diagnosis Date  . Anxiety   . Blood transfusion   . Depression   . GERD (gastroesophageal reflux disease)   . Hyperlipidemia   . Hypertension   . Neuromuscular disorder (HCC)     neuropathy hand and legs  . Weight loss, unintentional   . Adenomatous polyp of colon 10/2010  . Diverticula, colon   . CHF (congestive heart failure) ([place])   . 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  Had deceased donor kidney transplant in 1996.  Had some early rejection then stable function for years, then had slow decline of function and went back on hemodialysis in 2012.  Gets HD TTS [facility] at [facility] on [facility] still using L forearm AVF.     Marland Kitchen Anemia in CKD (chronic kidney disease) 11/07/2012  . Arthritis   . Osteoporosis   . Stroke [facility])     TIA  . Pneumonia   . Headache   . Constipation     Past Surgical History  Procedure Laterality Date  . Kidney transplant  1996  . Back surgery    . Cervical fusion    . Av fistula placement      for dialysis  . Cholecystectomy  12/02/2012    Procedure: LAPAROSCOPIC CHOLECYSTECTOMY WITH INTRAOPERATIVE CHOLANGIOGRAM;  Surgeon: Edward Jolly, MD;  Location: [place];  Service: General;  Laterality: N/A;  . Eye surgery Bilateral    cataract surgery  . Av fistula placement Left 11/22/2015    Procedure: ARTERIOVENOUS (AV) FISTULA CREATION-LEFT BRACHIOCEPHALIC;  Surgeon: Serafina Mitchell, MD;  Location: [facility] OR;  Service: Vascular;  Laterality: Left;  . 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  Visit Diagnosis:  Weakness of both lower extremities  Abnormality of gait      Subjective Assessment - 01/01/16 2211    Subjective "Today's my last day - I'm doing great"   Pertinent History Pt reported past TIAs   Patient Stated Goals Pt's goal for therapy is to be able to maneuver legs independently.   Currently in Pain? No/denies         Gait velocity 2.86 ft/sec with no device (11.43 secs)       Self Care; Reviewed progress and LTG's - pt verbalizes understanding of HEP and CVA education - feels that she is ready for D/C  FOTO discharge completed          [facility] Adult PT Treatment/Exercise - 01/01/16 1551    Dynamic Gait Index

## 2016-01-02 DIAGNOSIS — Z23 Encounter for immunization: Secondary | ICD-10-CM | POA: Diagnosis not present

## 2016-01-02 DIAGNOSIS — D631 Anemia in chronic kidney disease: Secondary | ICD-10-CM | POA: Diagnosis not present

## 2016-01-02 DIAGNOSIS — D509 Iron deficiency anemia, unspecified: Secondary | ICD-10-CM | POA: Diagnosis not present

## 2016-01-02 DIAGNOSIS — N2581 Secondary hyperparathyroidism of renal origin: Secondary | ICD-10-CM | POA: Diagnosis not present

## 2016-01-02 DIAGNOSIS — N186 End stage renal disease: Secondary | ICD-10-CM | POA: Diagnosis not present

## 2016-01-04 DIAGNOSIS — D631 Anemia in chronic kidney disease: Secondary | ICD-10-CM | POA: Diagnosis not present

## 2016-01-04 DIAGNOSIS — D509 Iron deficiency anemia, unspecified: Secondary | ICD-10-CM | POA: Diagnosis not present

## 2016-01-04 DIAGNOSIS — N2581 Secondary hyperparathyroidism of renal origin: Secondary | ICD-10-CM | POA: Diagnosis not present

## 2016-01-04 DIAGNOSIS — N186 End stage renal disease: Secondary | ICD-10-CM | POA: Diagnosis not present

## 2016-01-04 DIAGNOSIS — Z23 Encounter for immunization: Secondary | ICD-10-CM | POA: Diagnosis not present

## 2016-01-07 DIAGNOSIS — N186 End stage renal disease: Secondary | ICD-10-CM | POA: Diagnosis not present

## 2016-01-07 DIAGNOSIS — N2581 Secondary hyperparathyroidism of renal origin: Secondary | ICD-10-CM | POA: Diagnosis not present

## 2016-01-07 DIAGNOSIS — D509 Iron deficiency anemia, unspecified: Secondary | ICD-10-CM | POA: Diagnosis not present

## 2016-01-07 DIAGNOSIS — D631 Anemia in chronic kidney disease: Secondary | ICD-10-CM | POA: Diagnosis not present

## 2016-01-07 DIAGNOSIS — Z23 Encounter for immunization: Secondary | ICD-10-CM | POA: Diagnosis not present

## 2016-01-08 ENCOUNTER — Ambulatory Visit: Payer: Medicare Other | Admitting: Rehabilitation

## 2016-01-08 ENCOUNTER — Encounter: Payer: Self-pay | Admitting: Rehabilitation

## 2016-01-08 NOTE — Therapy (Signed)
[facility] [address] [place], [place], [address] Phone: [phone]   Fax:  [phone]  Patient Details  Name: [name] MRN: [id number] Date of Birth: 11/05/1951 Referring Provider:  No ref. provider found  Encounter Date: 01/08/2016   PHYSICAL THERAPY DISCHARGE SUMMARY  Visits from Start of Care: 9  Current functional level related to goals / functional outcomes:     PT Long Term Goals - 01/01/16 2220    PT LONG TERM GOAL #1   Title Pt will be independent with HEP for improved balance, gait and transfers.  TARGET 12/27/15   Baseline met 12/30/15   Status Achieved   PT LONG TERM GOAL #2   Title Pt will improve TUG score to less than or equal to 13.5 seconds for decreased fall risk.   Baseline 9.56 secs without cane, 10.50 w/ cane on 12/30/15   Status Achieved   PT LONG TERM GOAL #3   Title Pt will improve gait velocity to at least 2.3 ft/sec for improved gait efficiency and safety.   Baseline 11.43 secs = 2.86 ft/sec with no device   Status Achieved   PT LONG TERM GOAL #4   Title Pt will improve Dynamic Gait Index score to less than or equal to 12/24 for decreased fall risk.   Baseline 22/24 on DGI on 01-01-16   Status Achieved   PT LONG TERM GOAL #5   Title Pt will verbalize understanding of CVA education.   Status Achieved        Remaining deficits: Pt with higher level balance deficits and therefore recommend use of SPC in community.     Education / Equipment: HEP  Plan: Patient agrees to discharge.  Patient goals were met. Patient is being discharged due to meeting the stated rehab goals.  ?????         Cameron Sprang, PT, MPT [facility] [address] [place], [place], [address] Phone: [phone]   Fax:  [phone] 01/08/2016, 8:54 AM

## 2016-01-09 DIAGNOSIS — Z23 Encounter for immunization: Secondary | ICD-10-CM | POA: Diagnosis not present

## 2016-01-09 DIAGNOSIS — D509 Iron deficiency anemia, unspecified: Secondary | ICD-10-CM | POA: Diagnosis not present

## 2016-01-09 DIAGNOSIS — N2581 Secondary hyperparathyroidism of renal origin: Secondary | ICD-10-CM | POA: Diagnosis not present

## 2016-01-09 DIAGNOSIS — D631 Anemia in chronic kidney disease: Secondary | ICD-10-CM | POA: Diagnosis not present

## 2016-01-09 DIAGNOSIS — N186 End stage renal disease: Secondary | ICD-10-CM | POA: Diagnosis not present

## 2016-01-10 ENCOUNTER — Ambulatory Visit: Payer: Medicare Other | Admitting: Rehabilitation

## 2016-01-11 DIAGNOSIS — N2581 Secondary hyperparathyroidism of renal origin: Secondary | ICD-10-CM | POA: Diagnosis not present

## 2016-01-11 DIAGNOSIS — D509 Iron deficiency anemia, unspecified: Secondary | ICD-10-CM | POA: Diagnosis not present

## 2016-01-11 DIAGNOSIS — N186 End stage renal disease: Secondary | ICD-10-CM | POA: Diagnosis not present

## 2016-01-11 DIAGNOSIS — D631 Anemia in chronic kidney disease: Secondary | ICD-10-CM | POA: Diagnosis not present

## 2016-01-11 DIAGNOSIS — Z23 Encounter for immunization: Secondary | ICD-10-CM | POA: Diagnosis not present

## 2016-01-14 DIAGNOSIS — D631 Anemia in chronic kidney disease: Secondary | ICD-10-CM | POA: Diagnosis not present

## 2016-01-14 DIAGNOSIS — N186 End stage renal disease: Secondary | ICD-10-CM | POA: Diagnosis not present

## 2016-01-14 DIAGNOSIS — N2581 Secondary hyperparathyroidism of renal origin: Secondary | ICD-10-CM | POA: Diagnosis not present

## 2016-01-14 DIAGNOSIS — D509 Iron deficiency anemia, unspecified: Secondary | ICD-10-CM | POA: Diagnosis not present

## 2016-01-14 DIAGNOSIS — Z992 Dependence on renal dialysis: Secondary | ICD-10-CM | POA: Diagnosis not present

## 2016-01-14 DIAGNOSIS — T8612 Kidney transplant failure: Secondary | ICD-10-CM | POA: Diagnosis not present

## 2016-01-14 DIAGNOSIS — Z23 Encounter for immunization: Secondary | ICD-10-CM | POA: Diagnosis not present

## 2016-01-16 DIAGNOSIS — N186 End stage renal disease: Secondary | ICD-10-CM | POA: Diagnosis not present

## 2016-01-16 DIAGNOSIS — D509 Iron deficiency anemia, unspecified: Secondary | ICD-10-CM | POA: Diagnosis not present

## 2016-01-16 DIAGNOSIS — D508 Other iron deficiency anemias: Secondary | ICD-10-CM | POA: Diagnosis not present

## 2016-01-16 DIAGNOSIS — N2581 Secondary hyperparathyroidism of renal origin: Secondary | ICD-10-CM | POA: Diagnosis not present

## 2016-01-16 DIAGNOSIS — D631 Anemia in chronic kidney disease: Secondary | ICD-10-CM | POA: Diagnosis not present

## 2016-01-18 DIAGNOSIS — D631 Anemia in chronic kidney disease: Secondary | ICD-10-CM | POA: Diagnosis not present

## 2016-01-18 DIAGNOSIS — N2581 Secondary hyperparathyroidism of renal origin: Secondary | ICD-10-CM | POA: Diagnosis not present

## 2016-01-18 DIAGNOSIS — D508 Other iron deficiency anemias: Secondary | ICD-10-CM | POA: Diagnosis not present

## 2016-01-18 DIAGNOSIS — N186 End stage renal disease: Secondary | ICD-10-CM | POA: Diagnosis not present

## 2016-01-18 DIAGNOSIS — D509 Iron deficiency anemia, unspecified: Secondary | ICD-10-CM | POA: Diagnosis not present

## 2016-01-21 DIAGNOSIS — N186 End stage renal disease: Secondary | ICD-10-CM | POA: Diagnosis not present

## 2016-01-21 DIAGNOSIS — N2581 Secondary hyperparathyroidism of renal origin: Secondary | ICD-10-CM | POA: Diagnosis not present

## 2016-01-21 DIAGNOSIS — D508 Other iron deficiency anemias: Secondary | ICD-10-CM | POA: Diagnosis not present

## 2016-01-21 DIAGNOSIS — D631 Anemia in chronic kidney disease: Secondary | ICD-10-CM | POA: Diagnosis not present

## 2016-01-21 DIAGNOSIS — D509 Iron deficiency anemia, unspecified: Secondary | ICD-10-CM | POA: Diagnosis not present

## 2016-01-23 DIAGNOSIS — N186 End stage renal disease: Secondary | ICD-10-CM | POA: Diagnosis not present

## 2016-01-23 DIAGNOSIS — D509 Iron deficiency anemia, unspecified: Secondary | ICD-10-CM | POA: Diagnosis not present

## 2016-01-23 DIAGNOSIS — D508 Other iron deficiency anemias: Secondary | ICD-10-CM | POA: Diagnosis not present

## 2016-01-23 DIAGNOSIS — N2581 Secondary hyperparathyroidism of renal origin: Secondary | ICD-10-CM | POA: Diagnosis not present

## 2016-01-23 DIAGNOSIS — D631 Anemia in chronic kidney disease: Secondary | ICD-10-CM | POA: Diagnosis not present

## 2016-01-25 DIAGNOSIS — N186 End stage renal disease: Secondary | ICD-10-CM | POA: Diagnosis not present

## 2016-01-25 DIAGNOSIS — D509 Iron deficiency anemia, unspecified: Secondary | ICD-10-CM | POA: Diagnosis not present

## 2016-01-25 DIAGNOSIS — D508 Other iron deficiency anemias: Secondary | ICD-10-CM | POA: Diagnosis not present

## 2016-01-25 DIAGNOSIS — D631 Anemia in chronic kidney disease: Secondary | ICD-10-CM | POA: Diagnosis not present

## 2016-01-25 DIAGNOSIS — N2581 Secondary hyperparathyroidism of renal origin: Secondary | ICD-10-CM | POA: Diagnosis not present

## 2016-01-28 DIAGNOSIS — D508 Other iron deficiency anemias: Secondary | ICD-10-CM | POA: Diagnosis not present

## 2016-01-28 DIAGNOSIS — N2581 Secondary hyperparathyroidism of renal origin: Secondary | ICD-10-CM | POA: Diagnosis not present

## 2016-01-28 DIAGNOSIS — D509 Iron deficiency anemia, unspecified: Secondary | ICD-10-CM | POA: Diagnosis not present

## 2016-01-28 DIAGNOSIS — D631 Anemia in chronic kidney disease: Secondary | ICD-10-CM | POA: Diagnosis not present

## 2016-01-28 DIAGNOSIS — N186 End stage renal disease: Secondary | ICD-10-CM | POA: Diagnosis not present

## 2016-01-30 DIAGNOSIS — D508 Other iron deficiency anemias: Secondary | ICD-10-CM | POA: Diagnosis not present

## 2016-01-30 DIAGNOSIS — N2581 Secondary hyperparathyroidism of renal origin: Secondary | ICD-10-CM | POA: Diagnosis not present

## 2016-01-30 DIAGNOSIS — D509 Iron deficiency anemia, unspecified: Secondary | ICD-10-CM | POA: Diagnosis not present

## 2016-01-30 DIAGNOSIS — N186 End stage renal disease: Secondary | ICD-10-CM | POA: Diagnosis not present

## 2016-01-30 DIAGNOSIS — D631 Anemia in chronic kidney disease: Secondary | ICD-10-CM | POA: Diagnosis not present

## 2016-02-01 DIAGNOSIS — D509 Iron deficiency anemia, unspecified: Secondary | ICD-10-CM | POA: Diagnosis not present

## 2016-02-01 DIAGNOSIS — D508 Other iron deficiency anemias: Secondary | ICD-10-CM | POA: Diagnosis not present

## 2016-02-01 DIAGNOSIS — N2581 Secondary hyperparathyroidism of renal origin: Secondary | ICD-10-CM | POA: Diagnosis not present

## 2016-02-01 DIAGNOSIS — N186 End stage renal disease: Secondary | ICD-10-CM | POA: Diagnosis not present

## 2016-02-01 DIAGNOSIS — D631 Anemia in chronic kidney disease: Secondary | ICD-10-CM | POA: Diagnosis not present

## 2016-02-04 DIAGNOSIS — D508 Other iron deficiency anemias: Secondary | ICD-10-CM | POA: Diagnosis not present

## 2016-02-04 DIAGNOSIS — D509 Iron deficiency anemia, unspecified: Secondary | ICD-10-CM | POA: Diagnosis not present

## 2016-02-04 DIAGNOSIS — D631 Anemia in chronic kidney disease: Secondary | ICD-10-CM | POA: Diagnosis not present

## 2016-02-04 DIAGNOSIS — N186 End stage renal disease: Secondary | ICD-10-CM | POA: Diagnosis not present

## 2016-02-04 DIAGNOSIS — N2581 Secondary hyperparathyroidism of renal origin: Secondary | ICD-10-CM | POA: Diagnosis not present

## 2016-02-06 DIAGNOSIS — D508 Other iron deficiency anemias: Secondary | ICD-10-CM | POA: Diagnosis not present

## 2016-02-06 DIAGNOSIS — N2581 Secondary hyperparathyroidism of renal origin: Secondary | ICD-10-CM | POA: Diagnosis not present

## 2016-02-06 DIAGNOSIS — N186 End stage renal disease: Secondary | ICD-10-CM | POA: Diagnosis not present

## 2016-02-06 DIAGNOSIS — D631 Anemia in chronic kidney disease: Secondary | ICD-10-CM | POA: Diagnosis not present

## 2016-02-06 DIAGNOSIS — D509 Iron deficiency anemia, unspecified: Secondary | ICD-10-CM | POA: Diagnosis not present

## 2016-02-08 DIAGNOSIS — D631 Anemia in chronic kidney disease: Secondary | ICD-10-CM | POA: Diagnosis not present

## 2016-02-08 DIAGNOSIS — N186 End stage renal disease: Secondary | ICD-10-CM | POA: Diagnosis not present

## 2016-02-08 DIAGNOSIS — D509 Iron deficiency anemia, unspecified: Secondary | ICD-10-CM | POA: Diagnosis not present

## 2016-02-08 DIAGNOSIS — N2581 Secondary hyperparathyroidism of renal origin: Secondary | ICD-10-CM | POA: Diagnosis not present

## 2016-02-08 DIAGNOSIS — D508 Other iron deficiency anemias: Secondary | ICD-10-CM | POA: Diagnosis not present

## 2016-02-11 DIAGNOSIS — T8612 Kidney transplant failure: Secondary | ICD-10-CM | POA: Diagnosis not present

## 2016-02-11 DIAGNOSIS — N2581 Secondary hyperparathyroidism of renal origin: Secondary | ICD-10-CM | POA: Diagnosis not present

## 2016-02-11 DIAGNOSIS — N186 End stage renal disease: Secondary | ICD-10-CM | POA: Diagnosis not present

## 2016-02-11 DIAGNOSIS — D631 Anemia in chronic kidney disease: Secondary | ICD-10-CM | POA: Diagnosis not present

## 2016-02-11 DIAGNOSIS — Z992 Dependence on renal dialysis: Secondary | ICD-10-CM | POA: Diagnosis not present

## 2016-02-11 DIAGNOSIS — D508 Other iron deficiency anemias: Secondary | ICD-10-CM | POA: Diagnosis not present

## 2016-02-11 DIAGNOSIS — D509 Iron deficiency anemia, unspecified: Secondary | ICD-10-CM | POA: Diagnosis not present

## 2016-02-13 DIAGNOSIS — D509 Iron deficiency anemia, unspecified: Secondary | ICD-10-CM | POA: Diagnosis not present

## 2016-02-13 DIAGNOSIS — D508 Other iron deficiency anemias: Secondary | ICD-10-CM | POA: Diagnosis not present

## 2016-02-13 DIAGNOSIS — N186 End stage renal disease: Secondary | ICD-10-CM | POA: Diagnosis not present

## 2016-02-13 DIAGNOSIS — D631 Anemia in chronic kidney disease: Secondary | ICD-10-CM | POA: Diagnosis not present

## 2016-02-13 DIAGNOSIS — N2581 Secondary hyperparathyroidism of renal origin: Secondary | ICD-10-CM | POA: Diagnosis not present

## 2016-02-15 DIAGNOSIS — D508 Other iron deficiency anemias: Secondary | ICD-10-CM | POA: Diagnosis not present

## 2016-02-15 DIAGNOSIS — D509 Iron deficiency anemia, unspecified: Secondary | ICD-10-CM | POA: Diagnosis not present

## 2016-02-15 DIAGNOSIS — N186 End stage renal disease: Secondary | ICD-10-CM | POA: Diagnosis not present

## 2016-02-15 DIAGNOSIS — D631 Anemia in chronic kidney disease: Secondary | ICD-10-CM | POA: Diagnosis not present

## 2016-02-15 DIAGNOSIS — N2581 Secondary hyperparathyroidism of renal origin: Secondary | ICD-10-CM | POA: Diagnosis not present

## 2016-02-18 DIAGNOSIS — D508 Other iron deficiency anemias: Secondary | ICD-10-CM | POA: Diagnosis not present

## 2016-02-18 DIAGNOSIS — D631 Anemia in chronic kidney disease: Secondary | ICD-10-CM | POA: Diagnosis not present

## 2016-02-18 DIAGNOSIS — N186 End stage renal disease: Secondary | ICD-10-CM | POA: Diagnosis not present

## 2016-02-18 DIAGNOSIS — D509 Iron deficiency anemia, unspecified: Secondary | ICD-10-CM | POA: Diagnosis not present

## 2016-02-18 DIAGNOSIS — N2581 Secondary hyperparathyroidism of renal origin: Secondary | ICD-10-CM | POA: Diagnosis not present

## 2016-02-20 DIAGNOSIS — N186 End stage renal disease: Secondary | ICD-10-CM | POA: Diagnosis not present

## 2016-02-20 DIAGNOSIS — D509 Iron deficiency anemia, unspecified: Secondary | ICD-10-CM | POA: Diagnosis not present

## 2016-02-20 DIAGNOSIS — D631 Anemia in chronic kidney disease: Secondary | ICD-10-CM | POA: Diagnosis not present

## 2016-02-20 DIAGNOSIS — N2581 Secondary hyperparathyroidism of renal origin: Secondary | ICD-10-CM | POA: Diagnosis not present

## 2016-02-20 DIAGNOSIS — D508 Other iron deficiency anemias: Secondary | ICD-10-CM | POA: Diagnosis not present

## 2016-02-22 DIAGNOSIS — D631 Anemia in chronic kidney disease: Secondary | ICD-10-CM | POA: Diagnosis not present

## 2016-02-22 DIAGNOSIS — D508 Other iron deficiency anemias: Secondary | ICD-10-CM | POA: Diagnosis not present

## 2016-02-22 DIAGNOSIS — D509 Iron deficiency anemia, unspecified: Secondary | ICD-10-CM | POA: Diagnosis not present

## 2016-02-22 DIAGNOSIS — N2581 Secondary hyperparathyroidism of renal origin: Secondary | ICD-10-CM | POA: Diagnosis not present

## 2016-02-22 DIAGNOSIS — N186 End stage renal disease: Secondary | ICD-10-CM | POA: Diagnosis not present

## 2016-02-25 DIAGNOSIS — N2581 Secondary hyperparathyroidism of renal origin: Secondary | ICD-10-CM | POA: Diagnosis not present

## 2016-02-25 DIAGNOSIS — D509 Iron deficiency anemia, unspecified: Secondary | ICD-10-CM | POA: Diagnosis not present

## 2016-02-25 DIAGNOSIS — D631 Anemia in chronic kidney disease: Secondary | ICD-10-CM | POA: Diagnosis not present

## 2016-02-25 DIAGNOSIS — D508 Other iron deficiency anemias: Secondary | ICD-10-CM | POA: Diagnosis not present

## 2016-02-25 DIAGNOSIS — N186 End stage renal disease: Secondary | ICD-10-CM | POA: Diagnosis not present

## 2016-02-26 ENCOUNTER — Encounter: Payer: Self-pay | Admitting: Surgery

## 2016-02-26 DIAGNOSIS — E782 Mixed hyperlipidemia: Secondary | ICD-10-CM | POA: Diagnosis not present

## 2016-02-26 DIAGNOSIS — I10 Essential (primary) hypertension: Secondary | ICD-10-CM | POA: Diagnosis not present

## 2016-02-26 DIAGNOSIS — D638 Anemia in other chronic diseases classified elsewhere: Secondary | ICD-10-CM | POA: Diagnosis not present

## 2016-02-26 DIAGNOSIS — I509 Heart failure, unspecified: Secondary | ICD-10-CM | POA: Diagnosis not present

## 2016-02-26 DIAGNOSIS — M8580 Other specified disorders of bone density and structure, unspecified site: Secondary | ICD-10-CM | POA: Diagnosis not present

## 2016-02-26 DIAGNOSIS — N186 End stage renal disease: Secondary | ICD-10-CM | POA: Diagnosis not present

## 2016-02-27 DIAGNOSIS — D508 Other iron deficiency anemias: Secondary | ICD-10-CM | POA: Diagnosis not present

## 2016-02-27 DIAGNOSIS — D509 Iron deficiency anemia, unspecified: Secondary | ICD-10-CM | POA: Diagnosis not present

## 2016-02-27 DIAGNOSIS — D631 Anemia in chronic kidney disease: Secondary | ICD-10-CM | POA: Diagnosis not present

## 2016-02-27 DIAGNOSIS — N186 End stage renal disease: Secondary | ICD-10-CM | POA: Diagnosis not present

## 2016-02-27 DIAGNOSIS — N2581 Secondary hyperparathyroidism of renal origin: Secondary | ICD-10-CM | POA: Diagnosis not present

## 2016-02-28 DIAGNOSIS — E785 Hyperlipidemia, unspecified: Secondary | ICD-10-CM | POA: Diagnosis not present

## 2016-02-28 DIAGNOSIS — H3530 Unspecified macular degeneration: Secondary | ICD-10-CM | POA: Diagnosis not present

## 2016-02-28 DIAGNOSIS — M79606 Pain in leg, unspecified: Secondary | ICD-10-CM | POA: Diagnosis not present

## 2016-02-28 DIAGNOSIS — I2584 Coronary atherosclerosis due to calcified coronary lesion: Secondary | ICD-10-CM | POA: Diagnosis not present

## 2016-02-28 DIAGNOSIS — Z992 Dependence on renal dialysis: Secondary | ICD-10-CM | POA: Diagnosis not present

## 2016-02-28 DIAGNOSIS — I120 Hypertensive chronic kidney disease with stage 5 chronic kidney disease or end stage renal disease: Secondary | ICD-10-CM | POA: Diagnosis not present

## 2016-02-28 DIAGNOSIS — R5383 Other fatigue: Secondary | ICD-10-CM | POA: Diagnosis not present

## 2016-02-28 DIAGNOSIS — M109 Gout, unspecified: Secondary | ICD-10-CM | POA: Diagnosis not present

## 2016-02-28 DIAGNOSIS — N186 End stage renal disease: Secondary | ICD-10-CM | POA: Diagnosis not present

## 2016-02-28 DIAGNOSIS — I509 Heart failure, unspecified: Secondary | ICD-10-CM | POA: Diagnosis not present

## 2016-02-28 DIAGNOSIS — Z87891 Personal history of nicotine dependence: Secondary | ICD-10-CM | POA: Diagnosis not present

## 2016-02-28 DIAGNOSIS — R9439 Abnormal result of other cardiovascular function study: Secondary | ICD-10-CM | POA: Diagnosis not present

## 2016-02-28 DIAGNOSIS — M1990 Unspecified osteoarthritis, unspecified site: Secondary | ICD-10-CM | POA: Diagnosis not present

## 2016-02-28 DIAGNOSIS — I2510 Atherosclerotic heart disease of native coronary artery without angina pectoris: Secondary | ICD-10-CM | POA: Diagnosis not present

## 2016-02-29 DIAGNOSIS — N2581 Secondary hyperparathyroidism of renal origin: Secondary | ICD-10-CM | POA: Diagnosis not present

## 2016-02-29 DIAGNOSIS — D509 Iron deficiency anemia, unspecified: Secondary | ICD-10-CM | POA: Diagnosis not present

## 2016-02-29 DIAGNOSIS — D508 Other iron deficiency anemias: Secondary | ICD-10-CM | POA: Diagnosis not present

## 2016-02-29 DIAGNOSIS — D631 Anemia in chronic kidney disease: Secondary | ICD-10-CM | POA: Diagnosis not present

## 2016-02-29 DIAGNOSIS — N186 End stage renal disease: Secondary | ICD-10-CM | POA: Diagnosis not present

## 2016-03-02 ENCOUNTER — Encounter: Payer: Self-pay | Admitting: Surgery

## 2016-03-02 ENCOUNTER — Ambulatory Visit (HOSPITAL_COMMUNITY)
Admission: RE | Admit: 2016-03-02 | Discharge: 2016-03-02 | Disposition: A | Discharge status: Home / Self Care | Payer: Medicare Other | Source: Ambulatory Visit | Attending: Surgery | Admitting: Surgery

## 2016-03-02 ENCOUNTER — Ambulatory Visit (INDEPENDENT_AMBULATORY_CARE_PROVIDER_SITE_OTHER): Payer: Medicare Other | Admitting: Surgery

## 2016-03-02 VITALS — BP 187/93 | HR 75 | Ht 62.0 in | Wt 134.2 lb

## 2016-03-02 DIAGNOSIS — Z9889 Other specified postprocedural states: Secondary | ICD-10-CM | POA: Insufficient documentation

## 2016-03-02 DIAGNOSIS — K219 Gastro-esophageal reflux disease without esophagitis: Secondary | ICD-10-CM | POA: Insufficient documentation

## 2016-03-02 DIAGNOSIS — I868 Varicose veins of other specified sites: Secondary | ICD-10-CM | POA: Diagnosis not present

## 2016-03-02 DIAGNOSIS — I132 Hypertensive heart and chronic kidney disease with heart failure and with stage 5 chronic kidney disease, or end stage renal disease: Secondary | ICD-10-CM | POA: Insufficient documentation

## 2016-03-02 DIAGNOSIS — I509 Heart failure, unspecified: Secondary | ICD-10-CM | POA: Insufficient documentation

## 2016-03-02 DIAGNOSIS — N186 End stage renal disease: Secondary | ICD-10-CM

## 2016-03-02 DIAGNOSIS — Z992 Dependence on renal dialysis: Secondary | ICD-10-CM

## 2016-03-02 DIAGNOSIS — F419 Anxiety disorder, unspecified: Secondary | ICD-10-CM | POA: Diagnosis not present

## 2016-03-02 DIAGNOSIS — E785 Hyperlipidemia, unspecified: Secondary | ICD-10-CM | POA: Diagnosis not present

## 2016-03-02 NOTE — Progress Notes (Signed)
Patient name: [name] MRN: [id number] DOB: 10/08/1951 Sex: female     Chief Complaint  Patient presents with  . Re-evaluation    2 month f/u - HD TTS    HISTORY OF PRESENT ILLNESS: The patient is back for follow-up.  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  I also performed a left brachiocephalic fistula.  She continues to dialyze through a catheter.  She has no complaints today.  She underwent cardiac catheterization a few days ago but tells me that everything was okay.  This was at [facility]  Past Medical History  Diagnosis Date  . Anxiety   . Blood transfusion   . Depression   . GERD (gastroesophageal reflux disease)   . Hyperlipidemia   . Hypertension   . Neuromuscular disorder (HCC)     neuropathy hand and legs  . Weight loss, unintentional   . Adenomatous polyp of colon 10/2010  . Diverticula, colon   . CHF (congestive heart failure) ([place])   . 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  Had deceased donor kidney transplant in 1996.  Had some early rejection then stable function for years, then had slow decline of function and went back on hemodialysis in 2012.  Gets HD T[facility] still using L forearm AVF.     Marland Kitchen Anemia in CKD (chronic kidney disease) 11/07/2012  . Arthritis   . Osteoporosis   . Stroke [facility])     TIA  . Pneumonia   . Headache   . Constipation     Past Surgical History  Procedure Laterality Date  . Kidney transplant  1996  . Back surgery    . Cervical fusion    . Av fistula placement      for dialysis  . Cholecystectomy  12/02/2012    Procedure: LAPAROSCOPIC CHOLECYSTECTOMY WITH INTRAOPERATIVE CHOLANGIOGRAM;  Surgeon: Edward Jolly, MD;  Location: [place];  Service: General;  Laterality: N/A;  . Eye surgery Bilateral     cataract surgery  . Av fistula placement Left 11/22/2015   Procedure: ARTERIOVENOUS (AV) FISTULA CREATION-LEFT BRACHIOCEPHALIC;  Surgeon: Serafina Mitchell, MD;  Location: [facility] OR;  Service: Vascular;  Laterality: Left;  . Resection of arteriovenous fistula aneurysm Left 11/22/2015    Procedure: RESECTION OF LEFT RADIOCEPHALIC FISTULA ANEURYSM ;  Surgeon: Serafina Mitchell, MD;  Location: [place];  Service: Vascular;  Laterality: Left;    Social History   Social History  . Marital Status: Widowed    Spouse Name: N/A  . Number of Children: 2  . Years of Education: N/A   Occupational History  . Not on file.   Social History Main Topics  . Smoking status: Former Smoker    Types: Cigarettes    Quit date: 12/31/1991  . Smokeless tobacco: Never Used  . Alcohol Use: No  . Drug Use: No  . Sexual Activity: No   Other Topics Concern  . Not on file   Social History Narrative    Family History  Problem Relation Age of Onset  . Colon cancer Brother   . Cancer Brother   . Coronary artery disease Mother 1  . Hyperlipidemia Mother   . Hypertension Mother   . Esophageal cancer Neg Hx   . Stomach cancer Neg Hx   . Rectal cancer Neg Hx  Allergies as of 03/02/2016 - Review Complete 03/02/2016  Allergen Reaction Noted  . Sulfa antibiotics Other (See Comments) 11/06/2012    Current Outpatient Prescriptions on File Prior to Visit  Medication Sig Dispense Refill  . acetaminophen (TYLENOL) 325 MG tablet Take 650 mg by mouth every 6 (six) hours as needed for mild pain.    Marland Kitchen allopurinol (ZYLOPRIM) 300 MG tablet Take 150 mg by mouth at bedtime.    . ALPRAZolam (XANAX) 0.25 MG tablet Take 0.25 mg by mouth 2 (two) times daily.     Marland Kitchen aspirin EC 325 MG tablet Take 325 mg by mouth at bedtime.    Marland Kitchen b complex-vitamin c-folic acid (NEPHRO-VITE) 0.8 MG TABS tablet Take 1 tablet by mouth at bedtime.    . cinacalcet (SENSIPAR) 30 MG tablet Take 30 mg by mouth daily.    Marland Kitchen doxazosin (CARDURA) 8 MG tablet Take 1 tablet (8 mg total) by mouth 2 (two) times daily as  needed (hypertension).    Marland Kitchen HYDROcodone-acetaminophen (NORCO) 5-325 MG tablet Take 1 tablet by mouth every 6 (six) hours as needed for moderate pain. 30 tablet 0  . HYDROcodone-acetaminophen (NORCO) 5-325 MG tablet Take 1 tablet by mouth every 6 (six) hours as needed for moderate pain. 30 tablet 0  . labetalol (NORMODYNE) 300 MG tablet Take 300 mg by mouth 2 (two) times daily.    Marland Kitchen lactulose (CHRONULAC) 10 GM/15ML solution Take 20 g by mouth daily as needed for mild constipation.    Marland Kitchen losartan (COZAAR) 100 MG tablet Take 100 mg by mouth at bedtime.     . promethazine (PHENERGAN) 25 MG tablet Take 25 mg by mouth every 6 (six) hours as needed for nausea or vomiting.    . sevelamer carbonate (RENVELA) 800 MG tablet Take 1,600 mg by mouth 3 (three) times daily with meals. Reported on 12/20/2015    . simvastatin (ZOCOR) 10 MG tablet Take 10 mg by mouth at bedtime.     Marland Kitchen zolpidem (AMBIEN) 10 MG tablet Take 10 mg by mouth at bedtime.    . calcium carbonate (TUMS) 500 MG chewable tablet Chew 500 mg by mouth 3 (three) times daily. Reported on 03/02/2016    . clindamycin (CLEOCIN) 150 MG capsule Take 2 capsules (300 mg total) by mouth 3 (three) times daily. (Patient not taking: Reported on 12/10/2015) 42 capsule 0  . traMADol (ULTRAM) 50 MG tablet Take 50 mg by mouth every 12 (twelve) hours as needed for moderate pain. Reported on 03/02/2016  0   No current facility-administered medications on file prior to visit.     REVIEW OF SYSTEMS: Cardiovascular: No chest pain, chest pressure, Pulmonary: No productive cough Neurologic: No dizziness. Hematologic: No bleeding problems or clotting disorders. Musculoskeletal: No joint pain or joint swelling. Gastrointestinal: No blood in stool or hematemesis Genitourinary: On dialysis. Psychiatric:: No history of major depression. Integumentary: No rashes or ulcers. Constitutional: No fever or chills.  PHYSICAL EXAMINATION:   Vital signs are  Filed Vitals:    03/02/16 0929 03/02/16 0932  BP: 185/92 187/93  Pulse: 75   Height: 5\' 2"  (1.575 m)   Weight: 134 lb 3.2 oz (60.873 kg)   SpO2: 100%    Body mass index is 24.54 kg/(m^2). General: The patient appears their stated age. HEENT:  No gross abnormalities Pulmonary:  Non labored breathing Musculoskeletal: There are no major deformities. Neurologic: No focal weakness or paresthesias are detected, Skin: There are no ulcer or rashes noted. Psychiatric: The patient has normal  affect. Cardiovascular:  There is a good thrill within her fistula with multiple prominent branches.  The fistula itself was tortuous   Diagnostic Studies Ultrasound of her fistula was performed today.  This shows excellent diameters ranging from 0.73-1.1 cm.  All depths are appropriate.  There are multiple moderate sized branches  Assessment: End-stage renal disease Plan: The patient's fistula is ready for use.  If she has difficulty with cannulation, I would consider trying to straighten her fistula as well as ligate her branches.  However because it is a very prominent vein which is easily palpable I will try to use it first before branch ligation  V. Leia Alf, M.D. [facility] Office: [phone] Pager:  [phone]

## 2016-03-03 DIAGNOSIS — N2581 Secondary hyperparathyroidism of renal origin: Secondary | ICD-10-CM | POA: Diagnosis not present

## 2016-03-03 DIAGNOSIS — D509 Iron deficiency anemia, unspecified: Secondary | ICD-10-CM | POA: Diagnosis not present

## 2016-03-03 DIAGNOSIS — N186 End stage renal disease: Secondary | ICD-10-CM | POA: Diagnosis not present

## 2016-03-03 DIAGNOSIS — D508 Other iron deficiency anemias: Secondary | ICD-10-CM | POA: Diagnosis not present

## 2016-03-03 DIAGNOSIS — D631 Anemia in chronic kidney disease: Secondary | ICD-10-CM | POA: Diagnosis not present

## 2016-03-05 DIAGNOSIS — D631 Anemia in chronic kidney disease: Secondary | ICD-10-CM | POA: Diagnosis not present

## 2016-03-05 DIAGNOSIS — D508 Other iron deficiency anemias: Secondary | ICD-10-CM | POA: Diagnosis not present

## 2016-03-05 DIAGNOSIS — D509 Iron deficiency anemia, unspecified: Secondary | ICD-10-CM | POA: Diagnosis not present

## 2016-03-05 DIAGNOSIS — N2581 Secondary hyperparathyroidism of renal origin: Secondary | ICD-10-CM | POA: Diagnosis not present

## 2016-03-05 DIAGNOSIS — N186 End stage renal disease: Secondary | ICD-10-CM | POA: Diagnosis not present

## 2016-03-07 DIAGNOSIS — N186 End stage renal disease: Secondary | ICD-10-CM | POA: Diagnosis not present

## 2016-03-07 DIAGNOSIS — D509 Iron deficiency anemia, unspecified: Secondary | ICD-10-CM | POA: Diagnosis not present

## 2016-03-07 DIAGNOSIS — D508 Other iron deficiency anemias: Secondary | ICD-10-CM | POA: Diagnosis not present

## 2016-03-07 DIAGNOSIS — N2581 Secondary hyperparathyroidism of renal origin: Secondary | ICD-10-CM | POA: Diagnosis not present

## 2016-03-07 DIAGNOSIS — D631 Anemia in chronic kidney disease: Secondary | ICD-10-CM | POA: Diagnosis not present

## 2016-03-10 DIAGNOSIS — N2581 Secondary hyperparathyroidism of renal origin: Secondary | ICD-10-CM | POA: Diagnosis not present

## 2016-03-10 DIAGNOSIS — D509 Iron deficiency anemia, unspecified: Secondary | ICD-10-CM | POA: Diagnosis not present

## 2016-03-10 DIAGNOSIS — D631 Anemia in chronic kidney disease: Secondary | ICD-10-CM | POA: Diagnosis not present

## 2016-03-10 DIAGNOSIS — D508 Other iron deficiency anemias: Secondary | ICD-10-CM | POA: Diagnosis not present

## 2016-03-10 DIAGNOSIS — N186 End stage renal disease: Secondary | ICD-10-CM | POA: Diagnosis not present

## 2016-03-12 DIAGNOSIS — D509 Iron deficiency anemia, unspecified: Secondary | ICD-10-CM | POA: Diagnosis not present

## 2016-03-12 DIAGNOSIS — N2581 Secondary hyperparathyroidism of renal origin: Secondary | ICD-10-CM | POA: Diagnosis not present

## 2016-03-12 DIAGNOSIS — N186 End stage renal disease: Secondary | ICD-10-CM | POA: Diagnosis not present

## 2016-03-12 DIAGNOSIS — D631 Anemia in chronic kidney disease: Secondary | ICD-10-CM | POA: Diagnosis not present

## 2016-03-12 DIAGNOSIS — D508 Other iron deficiency anemias: Secondary | ICD-10-CM | POA: Diagnosis not present

## 2016-03-13 DIAGNOSIS — N186 End stage renal disease: Secondary | ICD-10-CM | POA: Diagnosis not present

## 2016-03-13 DIAGNOSIS — Z992 Dependence on renal dialysis: Secondary | ICD-10-CM | POA: Diagnosis not present

## 2016-03-13 DIAGNOSIS — T8612 Kidney transplant failure: Secondary | ICD-10-CM | POA: Diagnosis not present

## 2016-03-14 DIAGNOSIS — D631 Anemia in chronic kidney disease: Secondary | ICD-10-CM | POA: Diagnosis not present

## 2016-03-14 DIAGNOSIS — N186 End stage renal disease: Secondary | ICD-10-CM | POA: Diagnosis not present

## 2016-03-14 DIAGNOSIS — N2581 Secondary hyperparathyroidism of renal origin: Secondary | ICD-10-CM | POA: Diagnosis not present

## 2016-03-14 DIAGNOSIS — D508 Other iron deficiency anemias: Secondary | ICD-10-CM | POA: Diagnosis not present

## 2016-03-16 DIAGNOSIS — N2581 Secondary hyperparathyroidism of renal origin: Secondary | ICD-10-CM | POA: Diagnosis not present

## 2016-03-16 DIAGNOSIS — D631 Anemia in chronic kidney disease: Secondary | ICD-10-CM | POA: Diagnosis not present

## 2016-03-16 DIAGNOSIS — N186 End stage renal disease: Secondary | ICD-10-CM | POA: Diagnosis not present

## 2016-03-16 DIAGNOSIS — D508 Other iron deficiency anemias: Secondary | ICD-10-CM | POA: Diagnosis not present

## 2016-03-18 DIAGNOSIS — N186 End stage renal disease: Secondary | ICD-10-CM | POA: Diagnosis not present

## 2016-03-18 DIAGNOSIS — D508 Other iron deficiency anemias: Secondary | ICD-10-CM | POA: Diagnosis not present

## 2016-03-18 DIAGNOSIS — N2581 Secondary hyperparathyroidism of renal origin: Secondary | ICD-10-CM | POA: Diagnosis not present

## 2016-03-18 DIAGNOSIS — D631 Anemia in chronic kidney disease: Secondary | ICD-10-CM | POA: Diagnosis not present

## 2016-03-20 DIAGNOSIS — N2581 Secondary hyperparathyroidism of renal origin: Secondary | ICD-10-CM | POA: Diagnosis not present

## 2016-03-20 DIAGNOSIS — D508 Other iron deficiency anemias: Secondary | ICD-10-CM | POA: Diagnosis not present

## 2016-03-20 DIAGNOSIS — D631 Anemia in chronic kidney disease: Secondary | ICD-10-CM | POA: Diagnosis not present

## 2016-03-20 DIAGNOSIS — N186 End stage renal disease: Secondary | ICD-10-CM | POA: Diagnosis not present

## 2016-03-23 DIAGNOSIS — D508 Other iron deficiency anemias: Secondary | ICD-10-CM | POA: Diagnosis not present

## 2016-03-23 DIAGNOSIS — N186 End stage renal disease: Secondary | ICD-10-CM | POA: Diagnosis not present

## 2016-03-23 DIAGNOSIS — N2581 Secondary hyperparathyroidism of renal origin: Secondary | ICD-10-CM | POA: Diagnosis not present

## 2016-03-23 DIAGNOSIS — D631 Anemia in chronic kidney disease: Secondary | ICD-10-CM | POA: Diagnosis not present

## 2016-03-25 DIAGNOSIS — N2581 Secondary hyperparathyroidism of renal origin: Secondary | ICD-10-CM | POA: Diagnosis not present

## 2016-03-25 DIAGNOSIS — D631 Anemia in chronic kidney disease: Secondary | ICD-10-CM | POA: Diagnosis not present

## 2016-03-25 DIAGNOSIS — D508 Other iron deficiency anemias: Secondary | ICD-10-CM | POA: Diagnosis not present

## 2016-03-25 DIAGNOSIS — N186 End stage renal disease: Secondary | ICD-10-CM | POA: Diagnosis not present

## 2016-03-27 DIAGNOSIS — D631 Anemia in chronic kidney disease: Secondary | ICD-10-CM | POA: Diagnosis not present

## 2016-03-27 DIAGNOSIS — D508 Other iron deficiency anemias: Secondary | ICD-10-CM | POA: Diagnosis not present

## 2016-03-27 DIAGNOSIS — N2581 Secondary hyperparathyroidism of renal origin: Secondary | ICD-10-CM | POA: Diagnosis not present

## 2016-03-27 DIAGNOSIS — N186 End stage renal disease: Secondary | ICD-10-CM | POA: Diagnosis not present

## 2016-03-30 DIAGNOSIS — D631 Anemia in chronic kidney disease: Secondary | ICD-10-CM | POA: Diagnosis not present

## 2016-03-30 DIAGNOSIS — D508 Other iron deficiency anemias: Secondary | ICD-10-CM | POA: Diagnosis not present

## 2016-03-30 DIAGNOSIS — N186 End stage renal disease: Secondary | ICD-10-CM | POA: Diagnosis not present

## 2016-03-30 DIAGNOSIS — N2581 Secondary hyperparathyroidism of renal origin: Secondary | ICD-10-CM | POA: Diagnosis not present

## 2016-04-01 DIAGNOSIS — N2581 Secondary hyperparathyroidism of renal origin: Secondary | ICD-10-CM | POA: Diagnosis not present

## 2016-04-01 DIAGNOSIS — D631 Anemia in chronic kidney disease: Secondary | ICD-10-CM | POA: Diagnosis not present

## 2016-04-01 DIAGNOSIS — D508 Other iron deficiency anemias: Secondary | ICD-10-CM | POA: Diagnosis not present

## 2016-04-01 DIAGNOSIS — N186 End stage renal disease: Secondary | ICD-10-CM | POA: Diagnosis not present

## 2016-04-03 DIAGNOSIS — N186 End stage renal disease: Secondary | ICD-10-CM | POA: Diagnosis not present

## 2016-04-03 DIAGNOSIS — D508 Other iron deficiency anemias: Secondary | ICD-10-CM | POA: Diagnosis not present

## 2016-04-03 DIAGNOSIS — N2581 Secondary hyperparathyroidism of renal origin: Secondary | ICD-10-CM | POA: Diagnosis not present

## 2016-04-03 DIAGNOSIS — D631 Anemia in chronic kidney disease: Secondary | ICD-10-CM | POA: Diagnosis not present

## 2016-04-04 ENCOUNTER — Emergency Department (HOSPITAL_COMMUNITY)
Admission: EM | Admit: 2016-04-04 | Discharge: 2016-04-04 | Disposition: A | Discharge status: Home / Self Care | Payer: Medicare Other | Attending: Emergency Medicine | Admitting: Emergency Medicine

## 2016-04-04 ENCOUNTER — Encounter (HOSPITAL_COMMUNITY): Payer: Self-pay

## 2016-04-04 ENCOUNTER — Ambulatory Visit (INDEPENDENT_AMBULATORY_CARE_PROVIDER_SITE_OTHER): Payer: Medicare Other

## 2016-04-04 ENCOUNTER — Ambulatory Visit (HOSPITAL_COMMUNITY)
Admission: EM | Admit: 2016-04-04 | Discharge: 2016-04-04 | Disposition: A | Discharge status: Home / Self Care | Payer: Medicare Other | Attending: Emergency Medicine | Admitting: Emergency Medicine

## 2016-04-04 ENCOUNTER — Encounter (HOSPITAL_COMMUNITY): Payer: Self-pay | Admitting: Emergency Medicine

## 2016-04-04 DIAGNOSIS — I509 Heart failure, unspecified: Secondary | ICD-10-CM | POA: Insufficient documentation

## 2016-04-04 DIAGNOSIS — M810 Age-related osteoporosis without current pathological fracture: Secondary | ICD-10-CM | POA: Insufficient documentation

## 2016-04-04 DIAGNOSIS — Z992 Dependence on renal dialysis: Secondary | ICD-10-CM | POA: Insufficient documentation

## 2016-04-04 DIAGNOSIS — M1990 Unspecified osteoarthritis, unspecified site: Secondary | ICD-10-CM | POA: Diagnosis not present

## 2016-04-04 DIAGNOSIS — Z8701 Personal history of pneumonia (recurrent): Secondary | ICD-10-CM | POA: Insufficient documentation

## 2016-04-04 DIAGNOSIS — S6992XA Unspecified injury of left wrist, hand and finger(s), initial encounter: Secondary | ICD-10-CM | POA: Diagnosis present

## 2016-04-04 DIAGNOSIS — F329 Major depressive disorder, single episode, unspecified: Secondary | ICD-10-CM | POA: Diagnosis not present

## 2016-04-04 DIAGNOSIS — Y9289 Other specified places as the place of occurrence of the external cause: Secondary | ICD-10-CM | POA: Insufficient documentation

## 2016-04-04 DIAGNOSIS — N186 End stage renal disease: Secondary | ICD-10-CM | POA: Insufficient documentation

## 2016-04-04 DIAGNOSIS — I120 Hypertensive chronic kidney disease with stage 5 chronic kidney disease or end stage renal disease: Secondary | ICD-10-CM | POA: Diagnosis not present

## 2016-04-04 DIAGNOSIS — Z79899 Other long term (current) drug therapy: Secondary | ICD-10-CM | POA: Diagnosis not present

## 2016-04-04 DIAGNOSIS — Z7982 Long term (current) use of aspirin: Secondary | ICD-10-CM | POA: Insufficient documentation

## 2016-04-04 DIAGNOSIS — W010XXA Fall on same level from slipping, tripping and stumbling without subsequent striking against object, initial encounter: Secondary | ICD-10-CM | POA: Insufficient documentation

## 2016-04-04 DIAGNOSIS — E785 Hyperlipidemia, unspecified: Secondary | ICD-10-CM | POA: Diagnosis not present

## 2016-04-04 DIAGNOSIS — F419 Anxiety disorder, unspecified: Secondary | ICD-10-CM | POA: Diagnosis not present

## 2016-04-04 DIAGNOSIS — Y998 Other external cause status: Secondary | ICD-10-CM | POA: Insufficient documentation

## 2016-04-04 DIAGNOSIS — Y9389 Activity, other specified: Secondary | ICD-10-CM | POA: Diagnosis not present

## 2016-04-04 DIAGNOSIS — M20012 Mallet finger of left finger(s): Secondary | ICD-10-CM | POA: Diagnosis not present

## 2016-04-04 DIAGNOSIS — Z8673 Personal history of transient ischemic attack (TIA), and cerebral infarction without residual deficits: Secondary | ICD-10-CM | POA: Diagnosis not present

## 2016-04-04 DIAGNOSIS — D631 Anemia in chronic kidney disease: Secondary | ICD-10-CM | POA: Diagnosis not present

## 2016-04-04 DIAGNOSIS — Z87891 Personal history of nicotine dependence: Secondary | ICD-10-CM | POA: Diagnosis not present

## 2016-04-04 DIAGNOSIS — Z940 Kidney transplant status: Secondary | ICD-10-CM | POA: Diagnosis not present

## 2016-04-04 DIAGNOSIS — Z86010 Personal history of colonic polyps: Secondary | ICD-10-CM | POA: Diagnosis not present

## 2016-04-04 DIAGNOSIS — K219 Gastro-esophageal reflux disease without esophagitis: Secondary | ICD-10-CM | POA: Diagnosis not present

## 2016-04-04 MED ORDER — BUPIVACAINE HCL (PF) 0.25 % IJ SOLN
5.0000 mL | Freq: Once | INTRAMUSCULAR | Status: DC
  Filled 2016-04-04: qty 5

## 2016-04-04 NOTE — ED Notes (Addendum)
Patient was seen [name] earlier this morning for L middle finger injury - it was x-rayed and splinted there. Pt states she put ice on it today and it started aching really badly so she had to come back in for pain control. Circulation and sensation intact.

## 2016-04-04 NOTE — ED Notes (Signed)
PA-C to see and assess patient before RN assessment. See PA note. 

## 2016-04-04 NOTE — ED Provider Notes (Signed)
CSN: [id number]     Arrival date & time 04/04/16  2150 History  By signing my name below, I, Emmanuella Mensah, attest that this documentation has been prepared under the direction and in the presence of Shary Decamp, PA-C. Electronically Signed: Judithann Sauger, ED Scribe. 04/04/2016. 11:45 PM.    No chief complaint on file.  The history is provided by the patient. No language interpreter was used.   HPI Comments: [name] is a 65 y.o. female with a hx of HTN and ESRD (dialysis started again in 2012) who presents to the Emergency Department with a left middle finger splint complaining of gradually worsening 10/10 dull achy left middle finger pain s/p finger injury that occurred earlier in the day. She explains that she was working on her porch today when she had sat on a box to tie a tree but she slipped and fell with her finger bent backwards. No LOC. She states that she was seen at Urgent Care today and diagnosed with mallet finger. She states that she is here because her pain remains constant and she has tried Percocet and 2 Tylenols with no significant relief. Pt received ortho follow up paperwork from Urgent Care. No fever or chills.   Past Medical History  Diagnosis Date  . Anxiety   . Blood transfusion   . Depression   . GERD (gastroesophageal reflux disease)   . Hyperlipidemia   . Hypertension   . Neuromuscular disorder (HCC)     neuropathy hand and legs  . Weight loss, unintentional   . Adenomatous polyp of colon 10/2010  . Diverticula, colon   . CHF (congestive heart failure) ([place])   . 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  Had deceased donor kidney transplant in 1996.  Had some early rejection then stable function for years, then had slow decline of function and went back on hemodialysis in 2012.  Gets HD TTS schedule at [facility] still using L forearm AVF.     Marland Kitchen Anemia  in CKD (chronic kidney disease) 11/07/2012  . Arthritis   . Osteoporosis   . Stroke [facility])     TIA  . Pneumonia   . Headache   . Constipation    Past Surgical History  Procedure Laterality Date  . Kidney transplant  1996  . Back surgery    . Cervical fusion    . Av fistula placement      for dialysis  . Cholecystectomy  12/02/2012    Procedure: LAPAROSCOPIC CHOLECYSTECTOMY WITH INTRAOPERATIVE CHOLANGIOGRAM;  Surgeon: Edward Jolly, MD;  Location: [place];  Service: General;  Laterality: N/A;  . Eye surgery Bilateral     cataract surgery  . Av fistula placement Left 11/22/2015    Procedure: ARTERIOVENOUS (AV) FISTULA CREATION-LEFT BRACHIOCEPHALIC;  Surgeon: Serafina Mitchell, MD;  Location: [facility] OR;  Service: Vascular;  Laterality: Left;  . 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 Colon cancer Brother   . Cancer Brother   . Coronary artery disease Mother 14  . Hyperlipidemia Mother   . Hypertension Mother   . Esophageal cancer Neg Hx   . Stomach cancer Neg Hx   .  Rectal cancer Neg Hx    Social History  Substance Use Topics  . Smoking status: Former Smoker    Types: Cigarettes    Quit date: 12/31/1991  . Smokeless tobacco: Never Used  . Alcohol Use: No   OB History    No data available     Review of Systems ROS reviewed and all are negative for acute change except as noted in the HPI.  Allergies  Sulfa antibiotics  Home Medications   Prior to Admission medications   Medication Sig Start Date End Date Taking? Authorizing Provider  acetaminophen (TYLENOL) 325 MG tablet Take 650 mg by mouth every 6 (six) hours as needed for mild pain.    Historical Provider, MD  allopurinol (ZYLOPRIM) 300 MG tablet Take 150 mg by mouth at bedtime.    Historical  Provider, MD  ALPRAZolam Duanne Moron) 0.25 MG tablet Take 0.25 mg by mouth 2 (two) times daily.     Historical Provider, MD  aspirin EC 325 MG tablet Take 325 mg by mouth at bedtime.    Historical Provider, MD  b complex-vitamin c-folic acid (NEPHRO-VITE) 0.8 MG TABS tablet Take 1 tablet by mouth at bedtime.    Historical Provider, MD  calcium carbonate (TUMS) 500 MG chewable tablet Chew 500 mg by mouth 3 (three) times daily. Reported on 03/02/2016    Historical Provider, MD  cinacalcet (SENSIPAR) 30 MG tablet Take 30 mg by mouth daily.    Historical Provider, MD  clindamycin (CLEOCIN) 150 MG capsule Take 2 capsules (300 mg total) by mouth 3 (three) times daily. Patient not taking: Reported on 12/10/2015 12/05/15   Lahoma Rocker Kirichenko, PA-C  doxazosin (CARDURA) 8 MG tablet Take 1 tablet (8 mg total) by mouth 2 (two) times daily as needed (hypertension). 11/17/15   Modena Jansky, MD  HYDROcodone-acetaminophen (NORCO) 5-325 MG tablet Take 1 tablet by mouth every 6 (six) hours as needed for moderate pain. 11/22/15   Ulyses Amor, PA-C  HYDROcodone-acetaminophen (NORCO) 5-325 MG tablet Take 1 tablet by mouth every 6 (six) hours as needed for moderate pain. 11/22/15   Ulyses Amor, PA-C  labetalol (NORMODYNE) 300 MG tablet Take 300 mg by mouth 2 (two) times daily. 11/09/12   Velvet Bathe, MD  lactulose (CHRONULAC) 10 GM/15ML solution Take 20 g by mouth daily as needed for mild constipation.    Historical Provider, MD  losartan (COZAAR) 100 MG tablet Take 100 mg by mouth at bedtime.     Historical Provider, MD  promethazine (PHENERGAN) 25 MG tablet Take 25 mg by mouth every 6 (six) hours as needed for nausea or vomiting.    Historical Provider, MD  sevelamer carbonate (RENVELA) 800 MG tablet Take 1,600 mg by mouth 3 (three) times daily with meals. Reported on 12/20/2015    Historical Provider, MD  simvastatin (ZOCOR) 10 MG tablet Take 10 mg by mouth at bedtime.     Historical Provider, MD  traMADol (ULTRAM) 50  MG tablet Take 50 mg by mouth every 12 (twelve) hours as needed for moderate pain. Reported on 03/02/2016 10/25/15   Historical Provider, MD  zolpidem (AMBIEN) 10 MG tablet Take 10 mg by mouth at bedtime.    Historical Provider, MD   BP 149/80 mmHg  Pulse 81  Temp(Src) 98 F (36.7 C) (Oral)  Resp 16  SpO2 100%   Physical Exam  Constitutional: She is oriented to person, place, and time. She appears well-developed and well-nourished.  HENT:  Head: Normocephalic and atraumatic.  Eyes:  EOM are normal.  Cardiovascular: Normal rate and regular rhythm.   Pulmonary/Chest: Effort normal.  Abdominal: Soft.  Musculoskeletal: Normal range of motion.  Left Middle finger appropriately splinted. Good cap refill.   Neurological: She is alert and oriented to person, place, and time.  Skin: Skin is warm and dry.  Psychiatric: She has a normal mood and affect. Her behavior is normal. Thought content normal.  Nursing note and vitals reviewed.   ED Course  Procedures (including critical care time) DIAGNOSTIC STUDIES: Oxygen Saturation is 100% on RA, normal by my interpretation.    COORDINATION OF CARE: 10:53 PM- Pt advised of plan for treatment and pt agrees.   11:43 PM - Pt declines nerve block, stating that her pain is improving.   Imaging Review Dg Finger Middle Left  04/04/2016  CLINICAL DATA:  Recent fall with hyperextension injury of the third digit, initial encounter EXAM: LEFT MIDDLE FINGER 2+V COMPARISON:  11/28/2012 FINDINGS: Degenerative changes are noted at the third proximal interphalangeal joint with associated soft tissue calcifications. No acute fracture or dislocation is noted. No other focal abnormality is seen. IMPRESSION: Chronic changes at the third PIP joint. No acute bony abnormality is seen. Electronically Signed   By: Inez Catalina M.D.   On: 04/04/2016 17:23   Shary Decamp, PA-C has personally reviewed and evaluated these images as part of her medical decision-making.  MDM   I have reviewed and evaluated the relevant imaging studies. I have reviewed the relevant previous healthcare records. I obtained HPI from historian.  ED Course:  Assessment: Pt is a 64yF with hx ESRD who presents with mallet finger of left middle finger. Presents due to continued pain since being [facility] from Urgent Care. On exam, pt in NAD. Nontoxic/nonseptic appearing. VSS. Afebrile. Finger appropriately splinted with good cap refill. During wait time, the patient's pain improved. Pt declined digital block that I offered. Plan is to DC home with follow up to hand as previously provided by UC. At time of discharge, Patient is in no acute distress. Vital Signs are stable. Patient is able to ambulate. Patient able to tolerate PO.    Disposition/Plan:  DC Home Additional Verbal discharge instructions given and discussed with patient.  Pt Instructed to f/u with Hand Surgery evaluation and treatment of symptoms. Return precautions given Pt acknowledges and agrees with plan  Supervising Physician Leo Grosser, MD   Final diagnoses:  Mallet finger, left   I personally performed the services described in this documentation, which was scribed in my presence. The recorded information has been reviewed and is accurate.    Shary Decamp, PA-C 04/05/16 [id number]  Leo Grosser, MD 04/05/16 [phone]

## 2016-04-04 NOTE — ED Notes (Signed)
Pt departed in NAD.  

## 2016-04-04 NOTE — Discharge Instructions (Signed)
Your x-ray is normal. The tendon in your finger was injured. Keep the splint on as much as possible until you see the orthopedic doctor. It is okay to remove it to shower. Apply ice to help with the swelling. Please follow-up with either [facility] for Dr. Burney Gauze in 1-2 weeks.

## 2016-04-04 NOTE — ED Notes (Signed)
Patient presents with left middle finger injury, incident took place while she was sitting on a storage bucket and fell and hand got caught. No acute distress

## 2016-04-04 NOTE — ED Provider Notes (Signed)
CSN: [id number]     Arrival date & time 04/04/16  1532 History   First MD Initiated Contact with Patient 04/04/16 1641     Chief Complaint  Patient presents with  . Finger Injury   (Consider location/radiation/quality/duration/timing/severity/associated sxs/prior Treatment) HPI She is a 65 year old woman here for evaluation of left middle finger injury. She states she was sitting on a box when it fell and her finger got caught. She's not quite sure how the finger was bent. She has some pain at the DIP joint and some slight swelling.She is unable to extend the DIP joint.  Past Medical History  Diagnosis Date  . Anxiety   . Blood transfusion   . Depression   . GERD (gastroesophageal reflux disease)   . Hyperlipidemia   . Hypertension   . Neuromuscular disorder (HCC)     neuropathy hand and legs  . Weight loss, unintentional   . Adenomatous polyp of colon 10/2010  . Diverticula, colon   . CHF (congestive heart failure) ([place])   . 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  Had deceased donor kidney transplant in 1996.  Had some early rejection then stable function for years, then had slow decline of function and went back on hemodialysis in 2012.  Gets HD TTS schedule at [facility] on [facility] still using L forearm AVF.     Marland Kitchen Anemia in CKD (chronic kidney disease) 11/07/2012  . Arthritis   . Osteoporosis   . Stroke [facility])     TIA  . Pneumonia   . Headache   . Constipation    Past Surgical History  Procedure Laterality Date  . Kidney transplant  1996  . Back surgery    . Cervical fusion    . Av fistula placement      for dialysis  . Cholecystectomy  12/02/2012    Procedure: LAPAROSCOPIC CHOLECYSTECTOMY WITH INTRAOPERATIVE CHOLANGIOGRAM;  Surgeon: Edward Jolly, MD;  Location: [place];  Service: General;  Laterality: N/A;  . Eye surgery Bilateral     cataract surgery  . Av fistula placement  Left 11/22/2015    Procedure: ARTERIOVENOUS (AV) FISTULA CREATION-LEFT BRACHIOCEPHALIC;  Surgeon: Serafina Mitchell, MD;  Location: [facility] OR;  Service: Vascular;  Laterality: Left;  . 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 Colon cancer Brother   . Cancer Brother   . Coronary artery disease Mother 65  . Hyperlipidemia Mother   . Hypertension Mother   . Esophageal cancer Neg Hx   . Stomach cancer Neg Hx   . Rectal cancer Neg Hx    Social History  Substance Use Topics  . Smoking status: Former Smoker    Types: Cigarettes    Quit date: 12/31/1991  . Smokeless tobacco: Never Used  . Alcohol Use: No   OB History    No data available     Review of Systems as in history of present illness Allergies  Sulfa antibiotics  Home Medications   Prior to Admission medications   Medication Sig Start Date End Date Taking? Authorizing Provider  acetaminophen (TYLENOL) 325 MG tablet Take 650 mg by mouth every 6 (six) hours as needed for mild pain.  Yes Historical Provider, MD  allopurinol (ZYLOPRIM) 300 MG tablet Take 150 mg by mouth at bedtime.   Yes Historical Provider, MD  aspirin EC 325 MG tablet Take 325 mg by mouth at bedtime.   Yes Historical Provider, MD  labetalol (NORMODYNE) 300 MG tablet Take 300 mg by mouth 2 (two) times daily. 11/09/12  Yes Velvet Bathe, MD  lactulose (CHRONULAC) 10 GM/15ML solution Take 20 g by mouth daily as needed for mild constipation.   Yes Historical Provider, MD  losartan (COZAAR) 100 MG tablet Take 100 mg by mouth at bedtime.    Yes Historical Provider, MD  sevelamer carbonate (RENVELA) 800 MG tablet Take 1,600 mg by mouth 3 (three) times daily with meals. Reported on 12/20/2015   Yes Historical Provider, MD  simvastatin (ZOCOR) 10 MG tablet  Take 10 mg by mouth at bedtime.    Yes Historical Provider, MD  zolpidem (AMBIEN) 10 MG tablet Take 10 mg by mouth at bedtime.   Yes Historical Provider, MD  ALPRAZolam (XANAX) 0.25 MG tablet Take 0.25 mg by mouth 2 (two) times daily.     Historical Provider, MD  b complex-vitamin c-folic acid (NEPHRO-VITE) 0.8 MG TABS tablet Take 1 tablet by mouth at bedtime.    Historical Provider, MD  calcium carbonate (TUMS) 500 MG chewable tablet Chew 500 mg by mouth 3 (three) times daily. Reported on 03/02/2016    Historical Provider, MD  cinacalcet (SENSIPAR) 30 MG tablet Take 30 mg by mouth daily.    Historical Provider, MD  clindamycin (CLEOCIN) 150 MG capsule Take 2 capsules (300 mg total) by mouth 3 (three) times daily. Patient not taking: Reported on 12/10/2015 12/05/15   Lahoma Rocker Kirichenko, PA-C  doxazosin (CARDURA) 8 MG tablet Take 1 tablet (8 mg total) by mouth 2 (two) times daily as needed (hypertension). 11/17/15   Modena Jansky, MD  HYDROcodone-acetaminophen (NORCO) 5-325 MG tablet Take 1 tablet by mouth every 6 (six) hours as needed for moderate pain. 11/22/15   Ulyses Amor, PA-C  HYDROcodone-acetaminophen (NORCO) 5-325 MG tablet Take 1 tablet by mouth every 6 (six) hours as needed for moderate pain. 11/22/15   Ulyses Amor, PA-C  promethazine (PHENERGAN) 25 MG tablet Take 25 mg by mouth every 6 (six) hours as needed for nausea or vomiting.    Historical Provider, MD  traMADol (ULTRAM) 50 MG tablet Take 50 mg by mouth every 12 (twelve) hours as needed for moderate pain. Reported on 03/02/2016 10/25/15   Historical Provider, MD   Meds Ordered and Administered this Visit  Medications - No data to display  BP 156/81 mmHg  Pulse 86  Temp(Src) 97.9 F (36.6 C) (Oral)  SpO2 100% No data found.   Physical Exam  Constitutional: She is oriented to person, place, and time. She appears well-developed and well-nourished. No distress.  Cardiovascular: Normal rate.   Pulmonary/Chest: Effort  normal.  Musculoskeletal:  Left hand: Middle finger has very slight swelling at the DIP joint. Minimal tenderness. She is unable to actively extend the DIP joint. I can passively extend it. Brisk cap refill.  Neurological: She is alert and oriented to person, place, and time.    ED Course  Procedures (including critical care time)  Labs Review Labs Reviewed - No data to display  Imaging Review No results found.    MDM   1. Acquired mallet finger of left hand    No bony deformity on x-ray. Placed in splint in full extension. Follow-up with orthopedics in  1-2 weeks.    Melony Overly, MD 04/04/16 [phone]

## 2016-04-06 DIAGNOSIS — D631 Anemia in chronic kidney disease: Secondary | ICD-10-CM | POA: Diagnosis not present

## 2016-04-06 DIAGNOSIS — N2581 Secondary hyperparathyroidism of renal origin: Secondary | ICD-10-CM | POA: Diagnosis not present

## 2016-04-06 DIAGNOSIS — D508 Other iron deficiency anemias: Secondary | ICD-10-CM | POA: Diagnosis not present

## 2016-04-06 DIAGNOSIS — N186 End stage renal disease: Secondary | ICD-10-CM | POA: Diagnosis not present

## 2016-04-08 DIAGNOSIS — D508 Other iron deficiency anemias: Secondary | ICD-10-CM | POA: Diagnosis not present

## 2016-04-08 DIAGNOSIS — D631 Anemia in chronic kidney disease: Secondary | ICD-10-CM | POA: Diagnosis not present

## 2016-04-08 DIAGNOSIS — N2581 Secondary hyperparathyroidism of renal origin: Secondary | ICD-10-CM | POA: Diagnosis not present

## 2016-04-08 DIAGNOSIS — N186 End stage renal disease: Secondary | ICD-10-CM | POA: Diagnosis not present

## 2016-04-09 DIAGNOSIS — M20012 Mallet finger of left finger(s): Secondary | ICD-10-CM | POA: Diagnosis not present

## 2016-04-09 DIAGNOSIS — M79645 Pain in left finger(s): Secondary | ICD-10-CM | POA: Diagnosis not present

## 2016-04-10 DIAGNOSIS — D631 Anemia in chronic kidney disease: Secondary | ICD-10-CM | POA: Diagnosis not present

## 2016-04-10 DIAGNOSIS — N186 End stage renal disease: Secondary | ICD-10-CM | POA: Diagnosis not present

## 2016-04-10 DIAGNOSIS — N2581 Secondary hyperparathyroidism of renal origin: Secondary | ICD-10-CM | POA: Diagnosis not present

## 2016-04-10 DIAGNOSIS — D508 Other iron deficiency anemias: Secondary | ICD-10-CM | POA: Diagnosis not present

## 2016-04-12 DIAGNOSIS — Z992 Dependence on renal dialysis: Secondary | ICD-10-CM | POA: Diagnosis not present

## 2016-04-12 DIAGNOSIS — T8612 Kidney transplant failure: Secondary | ICD-10-CM | POA: Diagnosis not present

## 2016-04-12 DIAGNOSIS — N186 End stage renal disease: Secondary | ICD-10-CM | POA: Diagnosis not present

## 2016-04-13 DIAGNOSIS — N2581 Secondary hyperparathyroidism of renal origin: Secondary | ICD-10-CM | POA: Diagnosis not present

## 2016-04-13 DIAGNOSIS — D631 Anemia in chronic kidney disease: Secondary | ICD-10-CM | POA: Diagnosis not present

## 2016-04-13 DIAGNOSIS — N186 End stage renal disease: Secondary | ICD-10-CM | POA: Diagnosis not present

## 2016-04-13 DIAGNOSIS — D509 Iron deficiency anemia, unspecified: Secondary | ICD-10-CM | POA: Diagnosis not present

## 2016-04-15 DIAGNOSIS — M20012 Mallet finger of left finger(s): Secondary | ICD-10-CM | POA: Diagnosis not present

## 2016-04-15 DIAGNOSIS — N2581 Secondary hyperparathyroidism of renal origin: Secondary | ICD-10-CM | POA: Diagnosis not present

## 2016-04-15 DIAGNOSIS — N186 End stage renal disease: Secondary | ICD-10-CM | POA: Diagnosis not present

## 2016-04-15 DIAGNOSIS — M79645 Pain in left finger(s): Secondary | ICD-10-CM | POA: Diagnosis not present

## 2016-04-15 DIAGNOSIS — D631 Anemia in chronic kidney disease: Secondary | ICD-10-CM | POA: Diagnosis not present

## 2016-04-15 DIAGNOSIS — D509 Iron deficiency anemia, unspecified: Secondary | ICD-10-CM | POA: Diagnosis not present

## 2016-04-16 DIAGNOSIS — Z452 Encounter for adjustment and management of vascular access device: Secondary | ICD-10-CM | POA: Diagnosis not present

## 2016-04-17 DIAGNOSIS — D509 Iron deficiency anemia, unspecified: Secondary | ICD-10-CM | POA: Diagnosis not present

## 2016-04-17 DIAGNOSIS — M20012 Mallet finger of left finger(s): Secondary | ICD-10-CM | POA: Diagnosis not present

## 2016-04-17 DIAGNOSIS — N2581 Secondary hyperparathyroidism of renal origin: Secondary | ICD-10-CM | POA: Diagnosis not present

## 2016-04-17 DIAGNOSIS — N186 End stage renal disease: Secondary | ICD-10-CM | POA: Diagnosis not present

## 2016-04-17 DIAGNOSIS — S60423A Blister (nonthermal) of left middle finger, initial encounter: Secondary | ICD-10-CM | POA: Diagnosis not present

## 2016-04-17 DIAGNOSIS — D631 Anemia in chronic kidney disease: Secondary | ICD-10-CM | POA: Diagnosis not present

## 2016-04-20 DIAGNOSIS — D509 Iron deficiency anemia, unspecified: Secondary | ICD-10-CM | POA: Diagnosis not present

## 2016-04-20 DIAGNOSIS — S60423D Blister (nonthermal) of left middle finger, subsequent encounter: Secondary | ICD-10-CM | POA: Diagnosis not present

## 2016-04-20 DIAGNOSIS — M20012 Mallet finger of left finger(s): Secondary | ICD-10-CM | POA: Diagnosis not present

## 2016-04-20 DIAGNOSIS — D631 Anemia in chronic kidney disease: Secondary | ICD-10-CM | POA: Diagnosis not present

## 2016-04-20 DIAGNOSIS — N2581 Secondary hyperparathyroidism of renal origin: Secondary | ICD-10-CM | POA: Diagnosis not present

## 2016-04-20 DIAGNOSIS — N186 End stage renal disease: Secondary | ICD-10-CM | POA: Diagnosis not present

## 2016-04-22 DIAGNOSIS — D631 Anemia in chronic kidney disease: Secondary | ICD-10-CM | POA: Diagnosis not present

## 2016-04-22 DIAGNOSIS — N2581 Secondary hyperparathyroidism of renal origin: Secondary | ICD-10-CM | POA: Diagnosis not present

## 2016-04-22 DIAGNOSIS — D509 Iron deficiency anemia, unspecified: Secondary | ICD-10-CM | POA: Diagnosis not present

## 2016-04-22 DIAGNOSIS — N186 End stage renal disease: Secondary | ICD-10-CM | POA: Diagnosis not present

## 2016-04-23 DIAGNOSIS — M20012 Mallet finger of left finger(s): Secondary | ICD-10-CM | POA: Diagnosis not present

## 2016-04-23 DIAGNOSIS — S60423D Blister (nonthermal) of left middle finger, subsequent encounter: Secondary | ICD-10-CM | POA: Diagnosis not present

## 2016-04-24 DIAGNOSIS — N186 End stage renal disease: Secondary | ICD-10-CM | POA: Diagnosis not present

## 2016-04-24 DIAGNOSIS — D631 Anemia in chronic kidney disease: Secondary | ICD-10-CM | POA: Diagnosis not present

## 2016-04-24 DIAGNOSIS — D509 Iron deficiency anemia, unspecified: Secondary | ICD-10-CM | POA: Diagnosis not present

## 2016-04-24 DIAGNOSIS — N2581 Secondary hyperparathyroidism of renal origin: Secondary | ICD-10-CM | POA: Diagnosis not present

## 2016-04-27 DIAGNOSIS — N2581 Secondary hyperparathyroidism of renal origin: Secondary | ICD-10-CM | POA: Diagnosis not present

## 2016-04-27 DIAGNOSIS — D631 Anemia in chronic kidney disease: Secondary | ICD-10-CM | POA: Diagnosis not present

## 2016-04-27 DIAGNOSIS — D509 Iron deficiency anemia, unspecified: Secondary | ICD-10-CM | POA: Diagnosis not present

## 2016-04-27 DIAGNOSIS — N186 End stage renal disease: Secondary | ICD-10-CM | POA: Diagnosis not present

## 2016-04-29 DIAGNOSIS — D509 Iron deficiency anemia, unspecified: Secondary | ICD-10-CM | POA: Diagnosis not present

## 2016-04-29 DIAGNOSIS — N2581 Secondary hyperparathyroidism of renal origin: Secondary | ICD-10-CM | POA: Diagnosis not present

## 2016-04-29 DIAGNOSIS — N186 End stage renal disease: Secondary | ICD-10-CM | POA: Diagnosis not present

## 2016-04-29 DIAGNOSIS — D631 Anemia in chronic kidney disease: Secondary | ICD-10-CM | POA: Diagnosis not present

## 2016-04-30 DIAGNOSIS — M20012 Mallet finger of left finger(s): Secondary | ICD-10-CM | POA: Diagnosis not present

## 2016-04-30 DIAGNOSIS — S60423D Blister (nonthermal) of left middle finger, subsequent encounter: Secondary | ICD-10-CM | POA: Diagnosis not present

## 2016-05-01 DIAGNOSIS — D509 Iron deficiency anemia, unspecified: Secondary | ICD-10-CM | POA: Diagnosis not present

## 2016-05-01 DIAGNOSIS — N2581 Secondary hyperparathyroidism of renal origin: Secondary | ICD-10-CM | POA: Diagnosis not present

## 2016-05-01 DIAGNOSIS — D631 Anemia in chronic kidney disease: Secondary | ICD-10-CM | POA: Diagnosis not present

## 2016-05-01 DIAGNOSIS — N186 End stage renal disease: Secondary | ICD-10-CM | POA: Diagnosis not present

## 2016-05-04 DIAGNOSIS — D631 Anemia in chronic kidney disease: Secondary | ICD-10-CM | POA: Diagnosis not present

## 2016-05-04 DIAGNOSIS — N186 End stage renal disease: Secondary | ICD-10-CM | POA: Diagnosis not present

## 2016-05-04 DIAGNOSIS — D509 Iron deficiency anemia, unspecified: Secondary | ICD-10-CM | POA: Diagnosis not present

## 2016-05-04 DIAGNOSIS — N2581 Secondary hyperparathyroidism of renal origin: Secondary | ICD-10-CM | POA: Diagnosis not present

## 2016-05-05 DIAGNOSIS — I10 Essential (primary) hypertension: Secondary | ICD-10-CM | POA: Diagnosis not present

## 2016-05-05 DIAGNOSIS — N186 End stage renal disease: Secondary | ICD-10-CM | POA: Diagnosis not present

## 2016-05-05 DIAGNOSIS — D638 Anemia in other chronic diseases classified elsewhere: Secondary | ICD-10-CM | POA: Diagnosis not present

## 2016-05-05 DIAGNOSIS — E7800 Pure hypercholesterolemia, unspecified: Secondary | ICD-10-CM | POA: Diagnosis not present

## 2016-05-06 DIAGNOSIS — N186 End stage renal disease: Secondary | ICD-10-CM | POA: Diagnosis not present

## 2016-05-06 DIAGNOSIS — D509 Iron deficiency anemia, unspecified: Secondary | ICD-10-CM | POA: Diagnosis not present

## 2016-05-06 DIAGNOSIS — N2581 Secondary hyperparathyroidism of renal origin: Secondary | ICD-10-CM | POA: Diagnosis not present

## 2016-05-06 DIAGNOSIS — D631 Anemia in chronic kidney disease: Secondary | ICD-10-CM | POA: Diagnosis not present

## 2016-05-08 DIAGNOSIS — N186 End stage renal disease: Secondary | ICD-10-CM | POA: Diagnosis not present

## 2016-05-08 DIAGNOSIS — N2581 Secondary hyperparathyroidism of renal origin: Secondary | ICD-10-CM | POA: Diagnosis not present

## 2016-05-08 DIAGNOSIS — D631 Anemia in chronic kidney disease: Secondary | ICD-10-CM | POA: Diagnosis not present

## 2016-05-08 DIAGNOSIS — D509 Iron deficiency anemia, unspecified: Secondary | ICD-10-CM | POA: Diagnosis not present

## 2016-05-11 DIAGNOSIS — D631 Anemia in chronic kidney disease: Secondary | ICD-10-CM | POA: Diagnosis not present

## 2016-05-11 DIAGNOSIS — N2581 Secondary hyperparathyroidism of renal origin: Secondary | ICD-10-CM | POA: Diagnosis not present

## 2016-05-11 DIAGNOSIS — N186 End stage renal disease: Secondary | ICD-10-CM | POA: Diagnosis not present

## 2016-05-11 DIAGNOSIS — D509 Iron deficiency anemia, unspecified: Secondary | ICD-10-CM | POA: Diagnosis not present

## 2016-05-12 DIAGNOSIS — M79645 Pain in left finger(s): Secondary | ICD-10-CM | POA: Diagnosis not present

## 2016-05-12 DIAGNOSIS — S60423D Blister (nonthermal) of left middle finger, subsequent encounter: Secondary | ICD-10-CM | POA: Diagnosis not present

## 2016-05-12 DIAGNOSIS — M20012 Mallet finger of left finger(s): Secondary | ICD-10-CM | POA: Diagnosis not present

## 2016-05-12 DIAGNOSIS — S60423A Blister (nonthermal) of left middle finger, initial encounter: Secondary | ICD-10-CM | POA: Diagnosis not present

## 2016-05-13 DIAGNOSIS — D631 Anemia in chronic kidney disease: Secondary | ICD-10-CM | POA: Diagnosis not present

## 2016-05-13 DIAGNOSIS — D509 Iron deficiency anemia, unspecified: Secondary | ICD-10-CM | POA: Diagnosis not present

## 2016-05-13 DIAGNOSIS — T8612 Kidney transplant failure: Secondary | ICD-10-CM | POA: Diagnosis not present

## 2016-05-13 DIAGNOSIS — N186 End stage renal disease: Secondary | ICD-10-CM | POA: Diagnosis not present

## 2016-05-13 DIAGNOSIS — Z992 Dependence on renal dialysis: Secondary | ICD-10-CM | POA: Diagnosis not present

## 2016-05-13 DIAGNOSIS — N2581 Secondary hyperparathyroidism of renal origin: Secondary | ICD-10-CM | POA: Diagnosis not present

## 2016-05-14 ENCOUNTER — Emergency Department (HOSPITAL_COMMUNITY): Payer: Medicare Other

## 2016-05-14 ENCOUNTER — Inpatient Hospital Stay (HOSPITAL_COMMUNITY)
Admission: EM | Admit: 2016-05-14 | Discharge: 2016-05-16 | DRG: 393 | Disposition: A | Discharge status: Home / Self Care | Payer: Medicare Other | Attending: Internal Medicine | Admitting: Internal Medicine

## 2016-05-14 ENCOUNTER — Encounter (HOSPITAL_COMMUNITY): Payer: Self-pay | Admitting: Emergency Medicine

## 2016-05-14 DIAGNOSIS — Z992 Dependence on renal dialysis: Secondary | ICD-10-CM

## 2016-05-14 DIAGNOSIS — R079 Chest pain, unspecified: Secondary | ICD-10-CM | POA: Diagnosis not present

## 2016-05-14 DIAGNOSIS — Z79899 Other long term (current) drug therapy: Secondary | ICD-10-CM

## 2016-05-14 DIAGNOSIS — R1030 Lower abdominal pain, unspecified: Secondary | ICD-10-CM | POA: Diagnosis not present

## 2016-05-14 DIAGNOSIS — N189 Chronic kidney disease, unspecified: Secondary | ICD-10-CM

## 2016-05-14 DIAGNOSIS — K529 Noninfective gastroenteritis and colitis, unspecified: Secondary | ICD-10-CM | POA: Diagnosis present

## 2016-05-14 DIAGNOSIS — N2581 Secondary hyperparathyroidism of renal origin: Secondary | ICD-10-CM | POA: Diagnosis present

## 2016-05-14 DIAGNOSIS — R34 Anuria and oliguria: Secondary | ICD-10-CM | POA: Diagnosis not present

## 2016-05-14 DIAGNOSIS — Z7982 Long term (current) use of aspirin: Secondary | ICD-10-CM

## 2016-05-14 DIAGNOSIS — K625 Hemorrhage of anus and rectum: Secondary | ICD-10-CM | POA: Diagnosis not present

## 2016-05-14 DIAGNOSIS — M109 Gout, unspecified: Secondary | ICD-10-CM | POA: Diagnosis present

## 2016-05-14 DIAGNOSIS — K55039 Acute (reversible) ischemia of large intestine, extent unspecified: Secondary | ICD-10-CM | POA: Diagnosis not present

## 2016-05-14 DIAGNOSIS — D62 Acute posthemorrhagic anemia: Secondary | ICD-10-CM | POA: Diagnosis not present

## 2016-05-14 DIAGNOSIS — Z981 Arthrodesis status: Secondary | ICD-10-CM

## 2016-05-14 DIAGNOSIS — Z87891 Personal history of nicotine dependence: Secondary | ICD-10-CM

## 2016-05-14 DIAGNOSIS — R1310 Dysphagia, unspecified: Secondary | ICD-10-CM | POA: Diagnosis present

## 2016-05-14 DIAGNOSIS — D631 Anemia in chronic kidney disease: Secondary | ICD-10-CM | POA: Diagnosis present

## 2016-05-14 DIAGNOSIS — I69391 Dysphagia following cerebral infarction: Secondary | ICD-10-CM

## 2016-05-14 DIAGNOSIS — E8889 Other specified metabolic disorders: Secondary | ICD-10-CM | POA: Diagnosis not present

## 2016-05-14 DIAGNOSIS — N186 End stage renal disease: Secondary | ICD-10-CM | POA: Diagnosis not present

## 2016-05-14 DIAGNOSIS — E785 Hyperlipidemia, unspecified: Secondary | ICD-10-CM | POA: Diagnosis present

## 2016-05-14 DIAGNOSIS — K210 Gastro-esophageal reflux disease with esophagitis: Secondary | ICD-10-CM | POA: Diagnosis present

## 2016-05-14 DIAGNOSIS — I10 Essential (primary) hypertension: Secondary | ICD-10-CM | POA: Diagnosis present

## 2016-05-14 DIAGNOSIS — R0789 Other chest pain: Secondary | ICD-10-CM | POA: Diagnosis not present

## 2016-05-14 DIAGNOSIS — I120 Hypertensive chronic kidney disease with stage 5 chronic kidney disease or end stage renal disease: Secondary | ICD-10-CM | POA: Diagnosis present

## 2016-05-14 HISTORY — DX: Other specified anxiety disorders: F41.8

## 2016-05-14 LAB — CBC
HEMATOCRIT: 37.5 % (ref 36.0–46.0)
HEMOGLOBIN: 11.7 g/dL — AB (ref 12.0–15.0)
MCH: 29.1 pg (ref 26.0–34.0)
MCHC: 31.2 g/dL (ref 30.0–36.0)
MCV: 93.3 fL (ref 78.0–100.0)
Platelets: 192 10*3/uL (ref 150–400)
RBC: 4.02 MIL/uL (ref 3.87–5.11)
RDW: 18.8 % — ABNORMAL HIGH (ref 11.5–15.5)
WBC: 5.3 10*3/uL (ref 4.0–10.5)

## 2016-05-14 LAB — HEMOGLOBIN AND HEMATOCRIT, BLOOD
HEMATOCRIT: 33.9 % — AB (ref 36.0–46.0)
Hemoglobin: 10.5 g/dL — ABNORMAL LOW (ref 12.0–15.0)

## 2016-05-14 LAB — COMPREHENSIVE METABOLIC PANEL
ALBUMIN: 3.5 g/dL (ref 3.5–5.0)
ALT: 13 U/L — ABNORMAL LOW (ref 14–54)
ANION GAP: 16 — AB (ref 5–15)
AST: 28 U/L (ref 15–41)
Alkaline Phosphatase: 140 U/L — ABNORMAL HIGH (ref 38–126)
BUN: 43 mg/dL — ABNORMAL HIGH (ref 6–20)
CHLORIDE: 91 mmol/L — AB (ref 101–111)
CO2: 24 mmol/L (ref 22–32)
Calcium: 10 mg/dL (ref 8.9–10.3)
Creatinine, Ser: 6.16 mg/dL — ABNORMAL HIGH (ref 0.44–1.00)
GFR calc Af Amer: 7 mL/min — ABNORMAL LOW (ref >60)
GFR calc non Af Amer: 6 mL/min — ABNORMAL LOW (ref >60)
GLUCOSE: 84 mg/dL (ref 65–99)
POTASSIUM: 4.2 mmol/L (ref 3.5–5.1)
SODIUM: 131 mmol/L — AB (ref 135–145)
Total Bilirubin: 0.4 mg/dL (ref 0.3–1.2)
Total Protein: 7.5 g/dL (ref 6.5–8.1)

## 2016-05-14 LAB — I-STAT TROPONIN, ED: TROPONIN I, POC: 0.01 ng/mL (ref 0.00–0.08)

## 2016-05-14 LAB — POC OCCULT BLOOD, ED: Fecal Occult Bld: POSITIVE — AB

## 2016-05-14 MED ORDER — DIATRIZOATE MEGLUMINE & SODIUM 66-10 % PO SOLN
ORAL | Status: AC
  Filled 2016-05-14: qty 30

## 2016-05-14 NOTE — ED Notes (Signed)
Per pt, pt dialysis patient, started having rectal bleeding with large clots coming out of her rectum. Pt dialysis MWF. Pt went yesterday. Pt c/o pain in her stomach, "feels funny down at the bottom". Pt states shes had the same thing happen in 2011 when she had diverticulosis. Pt in NAD, ambulatory with steady gait.

## 2016-05-14 NOTE — ED Notes (Signed)
Dr.Linker made aware of frequency of bloody bowel movement and large clots. H&H to be repeated

## 2016-05-14 NOTE — ED Provider Notes (Signed)
CSN: [id number]     Arrival date & time 05/14/16  1818 History   First MD Initiated Contact with Patient 05/14/16 2016     Chief Complaint  Patient presents with  . Rectal Bleeding     (Consider location/radiation/quality/duration/timing/severity/associated sxs/prior Treatment) HPI  Pt presenting with c/o lower abdominal pain and rectal bleeding. She states she began passing large clots of blood earlier today.  States she has a hx of similar symptoms in 2012- was diagnosed with diverticulosis and had to be transfused blood.  No fever/chills.  No vomiting.  She is a dialysis patient and had last session yesterday.  No fainting, no lightheadedness or fatigue.  No shortness of breath.  She also c/o midsternal chest pain which has been present constantly for the past 2 days.  No leg swelling.  There are no other associated systemic symptoms, there are no other alleviating or modifying factors.  No vomiting, no changes in stools until symptoms started acutely today.    Past Medical History  Diagnosis Date  . Depression with anxiety   . GERD (gastroesophageal reflux disease)   . Hyperlipidemia   . Hypertension   . Neuromuscular disorder (HCC)     neuropathy hand and legs  . Weight loss, unintentional   . Adenomatous polyp of colon 10/2010, 2006, 2015  . Diverticula, colon   . CHF (congestive heart failure) ([place])   . 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  Had deceased donor kidney transplant in 1996.  Had some early rejection then stable function for years, then had slow decline of function and went back on hemodialysis in 2012.  Gets HD TTS schedule at [facility] on [facility] still using L forearm AVF.     Marland Kitchen Anemia in CKD (chronic kidney disease) 11/07/2012    s/p blood transfusion.   . Arthritis   . Osteoporosis   . Stroke [facility]) 11/2015    TIA  . Pneumonia   . Headache   . Constipation    Past  Surgical History  Procedure Laterality Date  . Kidney transplant  1996  . Back surgery    . Cervical fusion    . Av fistula placement      for dialysis  . Cholecystectomy  12/02/2012    Procedure: LAPAROSCOPIC CHOLECYSTECTOMY WITH INTRAOPERATIVE CHOLANGIOGRAM;  Surgeon: Edward Jolly, MD;  Location: [place];  Service: General;  Laterality: N/A;  . Eye surgery Bilateral     cataract surgery  . Av fistula placement Left 11/22/2015    Procedure: ARTERIOVENOUS (AV) FISTULA CREATION-LEFT BRACHIOCEPHALIC;  Surgeon: Serafina Mitchell, MD;  Location: [facility] OR;  Service: Vascular;  Laterality: Left;  . 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 Colon cancer Brother   . Cancer Brother   . Coronary artery disease Mother 64  . Hyperlipidemia Mother   . Hypertension Mother   . Esophageal cancer Neg Hx   . Stomach cancer Neg Hx   . Rectal cancer Neg Hx    Social History  Substance Use Topics  . Smoking status: Former Smoker    Types: Cigarettes    Quit date: 12/31/1991  . Smokeless tobacco: Never Used  . Alcohol  Use: No   OB History    No data available     Review of Systems  ROS reviewed and all otherwise negative except for mentioned in HPI    Allergies  Sulfa antibiotics  Home Medications   Prior to Admission medications   Medication Sig Start Date End Date Taking? Authorizing Provider  acetaminophen (TYLENOL) 325 MG tablet Take 650 mg by mouth every 6 (six) hours as needed for mild pain.   Yes Historical Provider, MD  allopurinol (ZYLOPRIM) 300 MG tablet Take 150 mg by mouth at bedtime.   Yes Historical Provider, MD  ALPRAZolam (XANAX) 0.25 MG tablet Take 0.25 mg by mouth 2 (two) times daily as needed for anxiety.    Yes Historical Provider, MD  aspirin EC  325 MG tablet Take 325 mg by mouth at bedtime.   Yes Historical Provider, MD  b complex-vitamin c-folic acid (NEPHRO-VITE) 0.8 MG TABS tablet Take 1 tablet by mouth at bedtime.   Yes Historical Provider, MD  calcium carbonate (TUMS) 500 MG chewable tablet Chew 500 mg by mouth 3 (three) times daily. Reported on 03/02/2016   Yes Historical Provider, MD  doxazosin (CARDURA) 8 MG tablet Take 1 tablet (8 mg total) by mouth 2 (two) times daily as needed (hypertension). 11/17/15  Yes Modena Jansky, MD  labetalol (NORMODYNE) 300 MG tablet Take 300 mg by mouth 2 (two) times daily. 11/09/12  Yes Velvet Bathe, MD  lactulose (CHRONULAC) 10 GM/15ML solution Take 20 g by mouth daily as needed for mild constipation.   Yes Historical Provider, MD  losartan (COZAAR) 100 MG tablet Take 100 mg by mouth at bedtime.    Yes Historical Provider, MD  promethazine (PHENERGAN) 25 MG tablet Take 25 mg by mouth every 6 (six) hours as needed for nausea or vomiting.   Yes Historical Provider, MD  simvastatin (ZOCOR) 10 MG tablet Take 10 mg by mouth at bedtime.    Yes Historical Provider, MD  zolpidem (AMBIEN) 10 MG tablet Take 10 mg by mouth at bedtime.   Yes Historical Provider, MD   BP 113/66 mmHg  Pulse 79  Temp(Src) 98.1 F (36.7 C) (Oral)  Resp 14  Ht 5\' 2"  (1.575 m)  Wt 60.737 kg  BMI 24.48 kg/m2  SpO2 100%  Vitals reviewed Physical Exam  Physical Examination: General appearance - alert, well appearing, and in no distress Mental status - alert, oriented to person, place, and time Eyes - no conjunctival injection no scleral icterus Mouth - mucous membranes moist, pharynx normal without lesions Chest - clear to auscultation, no wheezes, rales or rhonchi, symmetric air entry Heart - normal rate, regular rhythm, normal S1, S2, no murmurs, rubs, clicks or gallops Abdomen - soft, bilateral lower abdominal tenderness to palpation, no gaurding or rebound tenderness, nondistended, no masses or organomegaly Rectal -  bright red blood at rectum, no masses, nontender Neurological - alert, oriented, normal speech Extremities - peripheral pulses normal, no pedal edema, no clubbing or cyanosis Skin - normal coloration and turgor, no rashes  ED Course  Procedures (including critical care time) Labs Review Labs Reviewed  COMPREHENSIVE METABOLIC PANEL - Abnormal; Notable for the following:    Sodium 131 (*)    Chloride 91 (*)    BUN 43 (*)    Creatinine, Ser 6.16 (*)    ALT 13 (*)    Alkaline Phosphatase 140 (*)    GFR calc non Af Amer 6 (*)    GFR calc Af Amer 7 (*)  Anion gap 16 (*)    All other components within normal limits  CBC - Abnormal; Notable for the following:    Hemoglobin 11.7 (*)    RDW 18.8 (*)    All other components within normal limits  HEMOGLOBIN AND HEMATOCRIT, BLOOD - Abnormal; Notable for the following:    Hemoglobin 10.5 (*)    HCT 33.9 (*)    All other components within normal limits  CBC - Abnormal; Notable for the following:    RBC 3.50 (*)    Hemoglobin 9.9 (*)    HCT 32.0 (*)    RDW 18.8 (*)    All other components within normal limits  CBC - Abnormal; Notable for the following:    RBC 3.09 (*)    Hemoglobin 9.0 (*)    HCT 28.1 (*)    RDW 18.9 (*)    All other components within normal limits  BASIC METABOLIC PANEL - Abnormal; Notable for the following:    Sodium 129 (*)    Chloride 91 (*)    BUN 48 (*)    Creatinine, Ser 6.81 (*)    GFR calc non Af Amer 6 (*)    GFR calc Af Amer 7 (*)    All other components within normal limits  GLUCOSE, CAPILLARY - Abnormal; Notable for the following:    Glucose-Capillary 131 (*)    All other components within normal limits  POC OCCULT BLOOD, ED - Abnormal; Notable for the following:    Fecal Occult Bld POSITIVE (*)    All other components within normal limits  MRSA PCR SCREENING  I-STAT TROPOININ, ED  TYPE AND SCREEN    Imaging Review Ct Abdomen Pelvis Wo Contrast  05/15/2016  CLINICAL DATA:  Lower abdominal  pain and rectal bleeding. EXAM: CT ABDOMEN AND PELVIS WITHOUT CONTRAST TECHNIQUE: Multidetector CT imaging of the abdomen and pelvis was performed following the standard protocol without IV contrast. COMPARISON:  CT 07/21/2014 FINDINGS: Lower chest: The heart is enlarged. There are coronary artery calcifications. Small amount pericardial fluid. Peribronchial nodularity in both lung bases is chronic and unchanged. Liver: No focal hepatic lesion allowing for lack contrast. Hepatobiliary: Clips in the gallbladder fossa postcholecystectomy. No biliary dilatation. Pancreas: No ductal dilatation or inflammation. Pancreatic head partially obscured by adjacent bowel loops. Spleen: Normal. Adrenal glands: Not well visualized.  No evidence of adrenal mass. Kidneys: Native kidneys are atrophic. Multiple cysts throughout the left kidney are unchanged. Small cyst in the right kidney is not as well visualized on the current exam due to progressive atrophy. Multifocal renal calcifications felt to be vascular. Soft tissue density in the left iliac fossa are consistent with failed renal transplant appears similar to prior exam. Stomach/Bowel: Wall thickening of the distal esophagus and at the gastric cardia. There are no dilated or thickened small bowel loops. There is colonic wall thickening extending from the rectum proximally to the it'll splenic flexure of the colon. Mild associated pericolonic edema. Diverticulosis of the distal colon without peridiverticular inflammation. The appendix is not confidently identified. Vascular/Lymphatic: Mildly enlarged retroperitoneal and pelvic lymph nodes are not significantly changed from prior. Left periaortic node measures 1.4 cm image 25 series 2, previously 1.3 cm. Smaller left periaortic nodes more inferiorly are unchanged. Prominent left external iliac nodes are again seen. No evidence of progressive retroperitoneal or pelvic adenopathy. There is advanced atherosclerosis of the  abdominal aorta and its branches. Tortuosity of the abdominal aorta without aneurysm. Reproductive: Unchanged uterine contours, lobular appearance, may reflect  underlying fibroids. This is stable. No adnexal mass. Bladder: Completely decompressed. Other: No free air, free fluid, or intra-abdominal fluid collection. Suprapubic laxity of the anterior abdominal wall is unchanged. Musculoskeletal: There are no acute or suspicious osseous abnormalities. Stable anterolisthesis of L4 on L5 with inter spines fusion device. This is unchanged in appearance. Increased bone density suggesting renal osteodystrophy. Degenerative changes elsewhere in the spine are stable. IMPRESSION: 1. Colonic wall thickening from the splenic flexure through the rectum consistent with colitis. This may be infectious or inflammatory. Mild adjacent soft tissue stranding. 2. Additionally there is wall thickening in the distal esophagus and gastroesophageal junction. 3. Prominent retroperitoneal and pelvic lymph nodes are unchanged from exam early 2 years prior, suggesting reactive etiology. 4. Additional chronic findings are unchanged, as described. Electronically Signed   By: Jeb Levering M.D.   On: 05/15/2016 00:07   Dg Chest 2 View  05/14/2016  CLINICAL DATA:  Chest pain, soreness and flushing beginning yesterday. Initial encounter. EXAM: CHEST  2 VIEW COMPARISON:  PA and lateral chest 12/10/2015 and 05/02/2014. CT chest 05/02/2014. FINDINGS: There is cardiomegaly without edema. Chronic micronodularity in the upper lobes is consistent with remote infectious or inflammatory process and unchanged. The lungs are otherwise clear. No pneumothorax or pleural effusion. IMPRESSION: Cardiomegaly without acute disease. Electronically Signed   By: Inge Rise M.D.   On: 05/14/2016 18:55   I have personally reviewed and evaluated these images and lab results as part of my medical decision-making.   EKG Interpretation   Date/Time:  Thursday  May 14 2016 18:31:24 EDT Ventricular Rate:  96 PR Interval:  168 QRS Duration: 76 QT Interval:  380 QTC Calculation: 480 R Axis:   76 Text Interpretation:  Normal sinus rhythm Minimal voltage criteria for  LVH, may be normal variant Possible Anterior infarct , age undetermined  Abnormal ECG No significant change since last tracing Confirmed by [facility]   MD, [place] [phone]) on 05/14/2016 9:06:53 PM      MDM   Final diagnoses:  Rectal bleeding    Pt presenting with onset of rectal bleeding earlier today.  Also c/o some lower abdominal pain bilaterally.  Her hemoglobin is stable, CT scan shows colitis- pt started on IV abx.  Her hgb dropped 1 gram in 4 hours in the ED.   10:56 PM pt continues to have rectal bleeding, will recheck hemoglobin- was at her baseline on initial check- awaiting CT scan, continues to have lower abdominal pain.  Vitals are stable.   11:51 PM hemoglobin decreased 1 gram over 4 hours- she remains stable.  D/w Dr. Hilarie Fredrickson GI, he will consult on patient in the morning.  Will proceed with admission to medicine.    12:24 AM d/w Dr. Hal Hope, triad, pt to be admitted to hospitalist service, stepdown bed.    Alfonzo Beers, MD 05/15/16 [phone]

## 2016-05-14 NOTE — ED Notes (Signed)
Pt now complaining of Chest pain.

## 2016-05-15 ENCOUNTER — Encounter (HOSPITAL_COMMUNITY): Payer: Self-pay | Admitting: Internal Medicine

## 2016-05-15 DIAGNOSIS — M109 Gout, unspecified: Secondary | ICD-10-CM | POA: Diagnosis present

## 2016-05-15 DIAGNOSIS — K2971 Gastritis, unspecified, with bleeding: Secondary | ICD-10-CM | POA: Diagnosis not present

## 2016-05-15 DIAGNOSIS — K210 Gastro-esophageal reflux disease with esophagitis: Secondary | ICD-10-CM | POA: Diagnosis present

## 2016-05-15 DIAGNOSIS — E8889 Other specified metabolic disorders: Secondary | ICD-10-CM | POA: Diagnosis present

## 2016-05-15 DIAGNOSIS — Z992 Dependence on renal dialysis: Secondary | ICD-10-CM | POA: Diagnosis not present

## 2016-05-15 DIAGNOSIS — I69391 Dysphagia following cerebral infarction: Secondary | ICD-10-CM | POA: Diagnosis not present

## 2016-05-15 DIAGNOSIS — K559 Vascular disorder of intestine, unspecified: Secondary | ICD-10-CM | POA: Diagnosis not present

## 2016-05-15 DIAGNOSIS — N186 End stage renal disease: Secondary | ICD-10-CM

## 2016-05-15 DIAGNOSIS — Z7982 Long term (current) use of aspirin: Secondary | ICD-10-CM | POA: Diagnosis not present

## 2016-05-15 DIAGNOSIS — K625 Hemorrhage of anus and rectum: Secondary | ICD-10-CM | POA: Diagnosis not present

## 2016-05-15 DIAGNOSIS — N189 Chronic kidney disease, unspecified: Secondary | ICD-10-CM | POA: Diagnosis not present

## 2016-05-15 DIAGNOSIS — K529 Noninfective gastroenteritis and colitis, unspecified: Secondary | ICD-10-CM | POA: Diagnosis present

## 2016-05-15 DIAGNOSIS — D631 Anemia in chronic kidney disease: Secondary | ICD-10-CM | POA: Diagnosis present

## 2016-05-15 DIAGNOSIS — I10 Essential (primary) hypertension: Secondary | ICD-10-CM | POA: Diagnosis not present

## 2016-05-15 DIAGNOSIS — Z981 Arthrodesis status: Secondary | ICD-10-CM | POA: Diagnosis not present

## 2016-05-15 DIAGNOSIS — R34 Anuria and oliguria: Secondary | ICD-10-CM | POA: Diagnosis present

## 2016-05-15 DIAGNOSIS — N2581 Secondary hyperparathyroidism of renal origin: Secondary | ICD-10-CM | POA: Diagnosis present

## 2016-05-15 DIAGNOSIS — Z79899 Other long term (current) drug therapy: Secondary | ICD-10-CM | POA: Diagnosis not present

## 2016-05-15 DIAGNOSIS — K55039 Acute (reversible) ischemia of large intestine, extent unspecified: Secondary | ICD-10-CM | POA: Diagnosis not present

## 2016-05-15 DIAGNOSIS — Z87891 Personal history of nicotine dependence: Secondary | ICD-10-CM | POA: Diagnosis not present

## 2016-05-15 DIAGNOSIS — I120 Hypertensive chronic kidney disease with stage 5 chronic kidney disease or end stage renal disease: Secondary | ICD-10-CM | POA: Diagnosis present

## 2016-05-15 DIAGNOSIS — R1310 Dysphagia, unspecified: Secondary | ICD-10-CM | POA: Diagnosis present

## 2016-05-15 DIAGNOSIS — E785 Hyperlipidemia, unspecified: Secondary | ICD-10-CM | POA: Diagnosis present

## 2016-05-15 DIAGNOSIS — D62 Acute posthemorrhagic anemia: Secondary | ICD-10-CM | POA: Diagnosis present

## 2016-05-15 LAB — CBC
HCT: 32 % — ABNORMAL LOW (ref 36.0–46.0)
HEMATOCRIT: 28.1 % — AB (ref 36.0–46.0)
Hemoglobin: 9.9 g/dL — ABNORMAL LOW (ref 12.0–15.0)
Hemoglobin: 9 g/dL — ABNORMAL LOW (ref 12.0–15.0)
MCH: 28.3 pg (ref 26.0–34.0)
MCH: 29.1 pg (ref 26.0–34.0)
MCHC: 30.9 g/dL (ref 30.0–36.0)
MCHC: 32 g/dL (ref 30.0–36.0)
MCV: 91.4 fL (ref 78.0–100.0)
MCV: 90.9 fL (ref 78.0–100.0)
PLATELETS: 170 10*3/uL (ref 150–400)
PLATELETS: UNDETERMINED 10*3/uL (ref 150–400)
RBC: 3.5 MIL/uL — ABNORMAL LOW (ref 3.87–5.11)
RBC: 3.09 MIL/uL — ABNORMAL LOW (ref 3.87–5.11)
RDW: 18.8 % — ABNORMAL HIGH (ref 11.5–15.5)
RDW: 18.9 % — AB (ref 11.5–15.5)
WBC: 6.1 10*3/uL (ref 4.0–10.5)
WBC: 8.5 10*3/uL (ref 4.0–10.5)

## 2016-05-15 LAB — BASIC METABOLIC PANEL
ANION GAP: 14 (ref 5–15)
BUN: 48 mg/dL — ABNORMAL HIGH (ref 6–20)
CALCIUM: 9.5 mg/dL (ref 8.9–10.3)
CO2: 24 mmol/L (ref 22–32)
CREATININE: 6.81 mg/dL — AB (ref 0.44–1.00)
Chloride: 91 mmol/L — ABNORMAL LOW (ref 101–111)
GFR, EST AFRICAN AMERICAN: 7 mL/min — AB (ref >60)
GFR, EST NON AFRICAN AMERICAN: 6 mL/min — AB (ref >60)
GLUCOSE: 86 mg/dL (ref 65–99)
Potassium: 4 mmol/L (ref 3.5–5.1)
Sodium: 129 mmol/L — ABNORMAL LOW (ref 135–145)

## 2016-05-15 LAB — MRSA PCR SCREENING: MRSA by PCR: NEGATIVE

## 2016-05-15 LAB — GLUCOSE, CAPILLARY: GLUCOSE-CAPILLARY: 131 mg/dL — AB (ref 65–99)

## 2016-05-15 MED ORDER — CALCIUM CARBONATE ANTACID 500 MG PO CHEW
500.0000 mg | CHEWABLE_TABLET | Freq: Three times a day (TID) | ORAL | Status: DC
  Administered 2016-05-16 (×2): 500 mg via ORAL
  Filled 2016-05-15 (×3): qty 3

## 2016-05-15 MED ORDER — DIPHENHYDRAMINE HCL 50 MG/ML IJ SOLN
25.0000 mg | Freq: Once | INTRAMUSCULAR | Status: AC
  Administered 2016-05-15: 25 mg via INTRAVENOUS
  Filled 2016-05-15: qty 1

## 2016-05-15 MED ORDER — ACETAMINOPHEN 650 MG RE SUPP
650.0000 mg | Freq: Four times a day (QID) | RECTAL | Status: DC | PRN
Start: 2016-05-15 — End: 2016-05-17

## 2016-05-15 MED ORDER — METRONIDAZOLE IN NACL 5-0.79 MG/ML-% IV SOLN
500.0000 mg | Freq: Three times a day (TID) | INTRAVENOUS | Status: DC
  Administered 2016-05-15: 500 mg via INTRAVENOUS
  Filled 2016-05-15 (×2): qty 100

## 2016-05-15 MED ORDER — CIPROFLOXACIN IN D5W 400 MG/200ML IV SOLN: 400.0000 mg | INTRAVENOUS | Status: DC

## 2016-05-15 MED ORDER — LABETALOL HCL 200 MG PO TABS
300.0000 mg | ORAL_TABLET | Freq: Two times a day (BID) | ORAL | Status: DC
  Administered 2016-05-15 – 2016-05-16 (×3): 300 mg via ORAL
  Filled 2016-05-15 (×3): qty 2

## 2016-05-15 MED ORDER — SIMVASTATIN 10 MG PO TABS
10.0000 mg | ORAL_TABLET | Freq: Every day | ORAL | Status: DC
  Administered 2016-05-15: 10 mg via ORAL
  Filled 2016-05-15 (×2): qty 1

## 2016-05-15 MED ORDER — PANTOPRAZOLE SODIUM 40 MG PO TBEC
40.0000 mg | DELAYED_RELEASE_TABLET | Freq: Every day | ORAL | Status: DC
  Administered 2016-05-15 – 2016-05-16 (×2): 40 mg via ORAL
  Filled 2016-05-15 (×2): qty 1

## 2016-05-15 MED ORDER — ZOLPIDEM TARTRATE 5 MG PO TABS
5.0000 mg | ORAL_TABLET | Freq: Every day | ORAL | Status: DC
  Administered 2016-05-15 (×2): 5 mg via ORAL
  Filled 2016-05-15 (×2): qty 1

## 2016-05-15 MED ORDER — ONDANSETRON HCL 4 MG PO TABS: 4.0000 mg | ORAL_TABLET | Freq: Four times a day (QID) | ORAL | Status: DC | PRN

## 2016-05-15 MED ORDER — SODIUM CHLORIDE 0.9% FLUSH
3.0000 mL | Freq: Two times a day (BID) | INTRAVENOUS | Status: DC
  Administered 2016-05-15 – 2016-05-16 (×2): 3 mL via INTRAVENOUS

## 2016-05-15 MED ORDER — LOSARTAN POTASSIUM 50 MG PO TABS
100.0000 mg | ORAL_TABLET | Freq: Every day | ORAL | Status: DC
  Filled 2016-05-15: qty 2

## 2016-05-15 MED ORDER — ONDANSETRON HCL 4 MG/2ML IJ SOLN: 4.0000 mg | Freq: Four times a day (QID) | INTRAMUSCULAR | Status: DC | PRN

## 2016-05-15 MED ORDER — ACETAMINOPHEN 325 MG PO TABS: 650.0000 mg | ORAL_TABLET | Freq: Four times a day (QID) | ORAL | Status: DC | PRN

## 2016-05-15 MED ORDER — METRONIDAZOLE IN NACL 5-0.79 MG/ML-% IV SOLN
500.0000 mg | Freq: Once | INTRAVENOUS | Status: AC
  Administered 2016-05-15: 500 mg via INTRAVENOUS
  Filled 2016-05-15: qty 100

## 2016-05-15 MED ORDER — ALLOPURINOL 300 MG PO TABS
150.0000 mg | ORAL_TABLET | Freq: Every day | ORAL | Status: DC
Start: 2016-05-15 — End: 2016-05-17
  Administered 2016-05-15: 150 mg via ORAL
  Filled 2016-05-15: qty 1

## 2016-05-15 MED ORDER — ALPRAZOLAM 0.25 MG PO TABS
0.2500 mg | ORAL_TABLET | Freq: Two times a day (BID) | ORAL | Status: DC | PRN
  Administered 2016-05-16: 0.25 mg via ORAL
  Filled 2016-05-15: qty 1

## 2016-05-15 MED ORDER — ZOLPIDEM TARTRATE 5 MG PO TABS: 5.0000 mg | ORAL_TABLET | Freq: Once | ORAL | Status: DC

## 2016-05-15 MED ORDER — DOXAZOSIN MESYLATE 8 MG PO TABS
8.0000 mg | ORAL_TABLET | Freq: Two times a day (BID) | ORAL | Status: DC | PRN
  Filled 2016-05-15: qty 1

## 2016-05-15 MED ORDER — RENA-VITE PO TABS
1.0000 | ORAL_TABLET | Freq: Every day | ORAL | Status: DC
  Administered 2016-05-15: 1 via ORAL
  Filled 2016-05-15: qty 1

## 2016-05-15 MED ORDER — CIPROFLOXACIN IN D5W 400 MG/200ML IV SOLN
400.0000 mg | Freq: Once | INTRAVENOUS | Status: AC
  Administered 2016-05-15: 400 mg via INTRAVENOUS
  Filled 2016-05-15: qty 200

## 2016-05-15 NOTE — Progress Notes (Addendum)
Patient admitted after midnight, please see H&P.  On the phone and would not get off, kept telling me to wait.  Per GI: Strongly suspect ischemic colitis. Bowel rest, pain control. Maintain adequate intravascular volumes and blood pressure. No plans for flex sig or colonoscopy unless she fails to improve. Vascular disease and volume changes with HD likely contributing. Goal is to maintain adequate intravascular volumes. Clear liquids today and advance diet as GI symptoms improve. -called nephrology for HD  Eulogio Bear DO

## 2016-05-15 NOTE — Consult Note (Signed)
[facility] Consult: 8:38 AM 05/15/2016  LOS: 0 days    Referring Provider: Dr Eliseo Squires  Primary Care Physician:  Kandice Hams, MD Primary Gastroenterologist:  Dr. Delfin Edis     Reason for Consultation:  Hematochezia.    HPI: [name] is a 65 y.o. female.  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 ESRD.  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  Hemodialysis MWF.  HTN.  Gout. Severe multi-level spinal stenosis.  CHF, 04/2015 echo with 60% EF.  Vascular dz of heart and peripheral vessels by imaging.  Uterine fibroids. 10/2010 LGIB, likely diverticular: required 2 PRBCs for ABL anemia.   12/2013 Colonoscopy: sessile cecal polyp (tubular adenoma, no HGD) removed, sigmoid tics, no recurrent rectal polyp 07/2011 Flex Sig: sigmoid tics, internal hemorrhoids, no recrence rectal polyp 10/2010 Colonoscopy: sigmoid tics, 14 mm sessile rectal polyp snared/removed (tubular adenoma, low grade, focal dysplasia at cauterized edge) .  06/2005 EGD: 2 cm HH.  Pedunculated antral polyp removed (path: polypoid duodenal mucosa).   06/2005 Colonoscopy: rectal polyp (tubular adenoma), large int/ext polyps, sigmoid tics.  Takes full dose ASA.  No PPI, but on Tums for renal bone dz purposes PTA.  Takes Aleve, 1 pill, 1 to 2 x per week, used late last week and early this week for pain from finger injury ([place] finger post fall).   Acute onset rectal bleeding, crampy mostly left-sided lower abd pain in afternoon of 6/1.  That AM stool was brown.  Multiple subsequent episodes of hematochezia.  Pain, not-severe, has resolved. The scenario is highly reminiscent of 2011 diverticular bleed. Frequency and volume of bleeding is slowing.  No nausea, no vomiting.  No dyspepsia.  Has dysphagia post CVA but is careful when she eats and swallows: tolerates solids  and liquids.   Anuric.  Over several weeks has seen intermittent minor BRP when she wipes herself in region of vagina and urethra.  Non-contrast CT Abd/Pelvis:  Left-sided colitis, from splenic flex to rectum.  Distal esophageal thickening.  No change in enlarged RP and pelvic lymph nodes sugg of reactive etiology.  Hgb 11/2015: 9.  11.7 at arrival 05/14/16, to 9.5 this morning.  MCV 91.  Coags normal.      Past Medical History  Diagnosis Date  . Depression with anxiety   . GERD (gastroesophageal reflux disease)   . Hyperlipidemia   . Hypertension   . Neuromuscular disorder (HCC)     neuropathy hand and legs  . Weight loss, unintentional   . Adenomatous polyp of colon 10/2010, 2006, 2015  . Diverticula, colon   . CHF (congestive heart failure) ([place])   . 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  Had deceased donor kidney transplant in 1996.  Had some early rejection then stable function for years, then had slow decline of function and went back on hemodialysis in 2012.  Gets HD TTS schedule at [facility] still using L forearm AVF.     Marland Kitchen Anemia in  CKD (chronic kidney disease) 11/07/2012    s/p blood transfusion.   . Arthritis   . Osteoporosis   . Stroke [facility]) 11/2015    TIA  . Pneumonia   . Headache   . Constipation     Past Surgical History  Procedure Laterality Date  . Kidney transplant  1996  . Back surgery    . Cervical fusion    . Av fistula placement      for dialysis  . Cholecystectomy  12/02/2012    Procedure: LAPAROSCOPIC CHOLECYSTECTOMY WITH INTRAOPERATIVE CHOLANGIOGRAM;  Surgeon: Edward Jolly, MD;  Location: [facility];  Service: General;  Laterality: N/A;  . Eye surgery Bilateral     cataract surgery  . Av fistula placement Left 11/22/2015    Procedure: ARTERIOVENOUS (AV) FISTULA CREATION-LEFT BRACHIOCEPHALIC;  Surgeon: Serafina [facility], MD;  Location: [facility] OR;  Service:  Vascular;  Laterality: Left;  . Resection of arteriovenous fistula aneurysm Left 11/22/2015    Procedure: RESECTION OF LEFT RADIOCEPHALIC FISTULA ANEURYSM ;  Surgeon: Serafina [facility], MD;  Location: [facility];  Service: Vascular;  Laterality: Left;    Prior to Admission medications   Medication Sig Start Date End Date Taking? Authorizing Provider  acetaminophen (TYLENOL) 325 MG tablet Take 650 mg by mouth every 6 (six) hours as needed for mild pain.   Yes Historical Provider, MD  allopurinol (ZYLOPRIM) 300 MG tablet Take 150 mg by mouth at bedtime.   Yes Historical Provider, MD  ALPRAZolam (XANAX) 0.25 MG tablet Take 0.25 mg by mouth 2 (two) times daily as needed for anxiety.    Yes Historical Provider, MD  aspirin EC 325 MG tablet Take 325 mg by mouth at bedtime.   Yes Historical Provider, MD  b complex-vitamin c-folic acid (NEPHRO-VITE) 0.8 MG TABS tablet Take 1 tablet by mouth at bedtime.   Yes Historical Provider, MD  calcium carbonate (TUMS) 500 MG chewable tablet Chew 500 mg by mouth 3 (three) times daily. Reported on 03/02/2016   Yes Historical Provider, MD  doxazosin (CARDURA) 8 MG tablet Take 1 tablet (8 mg total) by mouth 2 (two) times daily as needed (hypertension). 11/17/15  Yes Modena Jansky, MD  labetalol (NORMODYNE) 300 MG tablet Take 300 mg by mouth 2 (two) times daily. 11/09/12  Yes Velvet Bathe, MD  lactulose (CHRONULAC) 10 GM/15ML solution Take 20 g by mouth daily as needed for mild constipation.   Yes Historical Provider, MD  losartan (COZAAR) 100 MG tablet Take 100 mg by mouth at bedtime.    Yes Historical Provider, MD  promethazine (PHENERGAN) 25 MG tablet Take 25 mg by mouth every 6 (six) hours as needed for nausea or vomiting.   Yes Historical Provider, MD  simvastatin (ZOCOR) 10 MG tablet Take 10 mg by mouth at bedtime.    Yes Historical Provider, MD  zolpidem (AMBIEN) 10 MG tablet Take 10 mg by mouth at bedtime.   Yes Historical Provider, MD    Scheduled Meds: .  allopurinol  150 mg Oral QHS  . calcium carbonate  500 mg Oral TID WC  . [START ON 05/16/2016] ciprofloxacin  400 mg Intravenous Q24H  . diatrizoate meglumine-sodium      . labetalol  300 mg Oral BID  . losartan  100 mg Oral QHS  . metronidazole  500 mg Intravenous Q8H  . multivitamin  1 tablet Oral QHS  . simvastatin  10 mg Oral QHS  . sodium chloride flush  3 mL Intravenous Q12H  .  zolpidem  5 mg Oral QHS   Infusions:   PRN Meds: acetaminophen **OR** acetaminophen, ALPRAZolam, doxazosin, ondansetron **OR** ondansetron (ZOFRAN) IV   Allergies as of 05/14/2016 - Review Complete 05/14/2016  Allergen Reaction Noted  . Sulfa antibiotics Other (See Comments) 11/06/2012    Family History  Problem Relation Age of Onset  . Colon cancer Brother   . Cancer Brother   . Coronary artery disease Mother 54  . Hyperlipidemia Mother   . Hypertension Mother   . Esophageal cancer Neg Hx   . Stomach cancer Neg Hx   . Rectal cancer Neg Hx     Social History   Social History  . Marital Status: Widowed    Spouse Name: N/A  . Number of Children: 2  . Years of Education: N/A   Occupational History  . Not on file.   Social History Main Topics  . Smoking status: Former Smoker    Types: Cigarettes    Quit date: 12/31/1991  . Smokeless tobacco: Never Used  . Alcohol Use: No  . Drug Use: No  . Sexual Activity: No   Other Topics Concern  . Not on file   Social History Narrative    REVIEW OF SYSTEMS: Constitutional:  Current weight reflects 6 # increase in 5 months.  ENT:  No nose bleeds Pulm:  No SOB, no cough CV:  No palpitations, no LE edema.  No chest pain.  GU:  anuric GI:  Per HPI Heme:  Per HPI   Transfusions:  In 2011 Neuro:  No headaches, no peripheral tingling or numbness.  Balance is compromised, no recent falls.  Derm:  No itching, no rash or sores.  Endocrine:  No sweats or chills.  No polyuria or dysuria Immunization:  Not queried Travel:  None beyond local  counties in last few months.    PHYSICAL EXAM: Vital signs in last 24 hours: Filed Vitals:   05/15/16 0400 05/15/16 0751  BP: 140/92 95/56  Pulse: 78 65  Temp:  97.5 F (36.4 C)  Resp: 18 15   Wt Readings from Last 3 Encounters:  05/15/16 60.737 kg (133 lb 14.4 oz)  03/02/16 60.873 kg (134 lb 3.2 oz)  12/20/15 57.607 kg (127 lb)    General: pleasant, looks well.  comfortable Head:  No asymmetry or swelling  Eyes:  No pallor or icterus Ears:  Not HOH  Nose:  No discharge Mouth:  Clear, moist, no blood.  Many missing teeth.  Neck:  No mass, no JVD Lungs:  Clear bil.  No dyspnea or cough Heart: RRR.  S1/S2 present.  Early, [id number] systolic murmer.  Abdomen:  Soft, ND.  Active BS.  No mass, no HSM.  Minor left sided tenderness.   Rectal: no mass, no stool, rusty brown blood on glove   Musc/Skeltl: no contracture deformities or swelling Extremities:  No CCE.  Fistula on left arm  Neurologic:  Oriented x 3.  Alert.  No tremor or gross weakness Skin:  No sores, no rash Psych:  Cooperative, calm, pleasant.    Intake/Output from previous day: 06/01 0701 - 06/02 0700 In: -  Out: 5 [Stool:5] Intake/Output this shift:    LAB RESULTS:  Recent Labs  05/14/16 1840 05/14/16 2304 05/15/16 0318  WBC 5.3  --  6.1  HGB 11.7* 10.5* 9.9*  HCT 37.5 33.9* 32.0*  PLT 192  --  170   BMET Lab Results  Component Value Date   NA 129* 05/15/2016   NA 131*  05/14/2016   NA 137 12/10/2015   K 4.0 05/15/2016   K 4.2 05/14/2016   K 3.0* 12/10/2015   CL 91* 05/15/2016   CL 91* 05/14/2016   CL 95* 12/10/2015   CO2 24 05/15/2016   CO2 24 05/14/2016   CO2 27 12/10/2015   GLUCOSE 86 05/15/2016   GLUCOSE 84 05/14/2016   GLUCOSE 126* 12/10/2015   BUN 48* 05/15/2016   BUN 43* 05/14/2016   BUN 10 12/10/2015   CREATININE 6.81* 05/15/2016   CREATININE 6.16* 05/14/2016   CREATININE 3.08* 12/10/2015   CALCIUM 9.5 05/15/2016   CALCIUM 10.0 05/14/2016   CALCIUM 8.7* 12/10/2015    LFT  Recent Labs  05/14/16 1840  PROT 7.5  ALBUMIN 3.5  AST 28  ALT 13*  ALKPHOS 140*  BILITOT 0.4   PT/INR Lab Results  Component Value Date   INR 1.13 11/19/2015   INR 1.06 11/16/2015   INR 1.18 11/29/2012    Lipase     Component Value Date/Time   LIPASE 48 12/19/2012 0256    Drugs of Abuse  No results found for: LABOPIA, COCAINSCRNUR, LABBENZ, AMPHETMU, THCU, LABBARB   RADIOLOGY STUDIES: Ct Abdomen Pelvis Wo Contrast  05/15/2016  CLINICAL DATA:  Lower abdominal pain and rectal bleeding. EXAM: CT ABDOMEN AND PELVIS WITHOUT CONTRAST TECHNIQUE: Multidetector CT imaging of the abdomen and pelvis was performed following the standard protocol without IV contrast. COMPARISON:  CT 07/21/2014 FINDINGS: Lower chest: The heart is enlarged. There are coronary artery calcifications. Small amount pericardial fluid. Peribronchial nodularity in both lung bases is chronic and unchanged. Liver: No focal hepatic lesion allowing for lack contrast. Hepatobiliary: Clips in the gallbladder fossa postcholecystectomy. No biliary dilatation. Pancreas: No ductal dilatation or inflammation. Pancreatic head partially obscured by adjacent bowel loops. Spleen: Normal. Adrenal glands: Not well visualized.  No evidence of adrenal mass. Kidneys: Native kidneys are atrophic. Multiple cysts throughout the left kidney are unchanged. Small cyst in the right kidney is not as well visualized on the current exam due to progressive atrophy. Multifocal renal calcifications felt to be vascular. Soft tissue density in the left iliac fossa are consistent with failed renal transplant appears similar to prior exam. Stomach/Bowel: Wall thickening of the distal esophagus and at the gastric cardia. There are no dilated or thickened small bowel loops. There is colonic wall thickening extending from the rectum proximally to the it'll splenic flexure of the colon. Mild associated pericolonic edema. Diverticulosis of the distal colon  without peridiverticular inflammation. The appendix is not confidently identified. Vascular/Lymphatic: Mildly enlarged retroperitoneal and pelvic lymph nodes are not significantly changed from prior. Left periaortic node measures 1.4 cm image 25 series 2, previously 1.3 cm. Smaller left periaortic nodes more inferiorly are unchanged. Prominent left external iliac nodes are again seen. No evidence of progressive retroperitoneal or pelvic adenopathy. There is advanced atherosclerosis of the abdominal aorta and its branches. Tortuosity of the abdominal aorta without aneurysm. Reproductive: Unchanged uterine contours, lobular appearance, may reflect underlying fibroids. This is stable. No adnexal mass. Bladder: Completely decompressed. Other: No free air, free fluid, or intra-abdominal fluid collection. Suprapubic laxity of the anterior abdominal wall is unchanged. Musculoskeletal: There are no acute or suspicious osseous abnormalities. Stable anterolisthesis of L4 on L5 with inter spines fusion device. This is unchanged in appearance. Increased bone density suggesting renal osteodystrophy. Degenerative changes elsewhere in the spine are stable. IMPRESSION: 1. Colonic wall thickening from the splenic flexure through the rectum consistent with colitis. This may be infectious  or inflammatory. Mild adjacent soft tissue stranding. 2. Additionally there is wall thickening in the distal esophagus and gastroesophageal junction. 3. Prominent retroperitoneal and pelvic lymph nodes are unchanged from exam early 2 years prior, suggesting reactive etiology. 4. Additional chronic findings are unchanged, as described. Electronically Signed   By: Jeb Levering M.D.   On: 05/15/2016 00:07   Dg Chest 2 View  05/14/2016  CLINICAL DATA:  Chest pain, soreness and flushing beginning yesterday. Initial encounter. EXAM: CHEST  2 VIEW COMPARISON:  PA and lateral chest 12/10/2015 and 05/02/2014. CT chest 05/02/2014. FINDINGS: There is  cardiomegaly without edema. Chronic micronodularity in the upper lobes is consistent with remote infectious or inflammatory process and unchanged. The lungs are otherwise clear. No pneumothorax or pleural effusion. IMPRESSION: Cardiomegaly without acute disease. Electronically Signed   By: Inge Rise M.D.   On: 05/14/2016 18:55    ENDOSCOPIC STUDIES: Per HPI  IMPRESSION:   *  Left sided colitis with hematochezia and pain.  Suspect ischemic colitis.  Alternatively could be having diverticular bleed.  Bleeding is slowing, abdominal pain mostly resolved  *  Esophagitis by CT, not on PPI at home.   *  ABL anemia  *  ESRD.  1996 renal transplant which eventually failed.  HD TTS  *  Hx recurrent adenomatous polyps of colon: 2006, 2011, 2015.  Up to date on surveillance studies.     PLAN:     *  Stopped cipro/flagyl.  Do not suspect infectious colitis. No fever or elevation WBCs.   *  Adding once per day oral PPI for esophagitis.  *  ? Need for flex sig? Perhaps not if sxs continue to improve Decision per Dr Fuller Plan.  *  Allow clears.    *  BID CBC x 3.     Azucena Freed  05/15/2016, 8:38 AM Pager: [phone]      Attending physician's note   I have taken a history, examined the patient and reviewed the chart. I agree with the Advanced Practitioner's note, impression and recommendations. Strongly suspect ischemic colitis. Bowel rest, pain control. Maintain adequate intravascular volumes and blood pressure.  No plans for flex sig or colonoscopy unless she fails to improve. Vascular disease and volume changes with HD likely contributing. Goal is to maintain adequate intravascular volumes. Clear liquids today and advance diet as GI symptoms improve. Outpatient GI follow up with Drs. Nandigam, Armbruster or Danis.   Lucio Edward, MD [facility] [phone] Mon-Fri 8a-5p [phone] after 5p, weekends, holidays

## 2016-05-15 NOTE — H&P (Signed)
History and Physical    [name] [id number] DOB: May 26, 1951 DOA: 05/14/2016  PCP: Kandice Hams, MD  Patient coming from: Home.  Chief Complaint: Rectal bleeding.  HPI: [name] is a 65 y.o. female with medical history significant of ESRD on hemodialysis on Tuesday Thursdays and Saturday, hypertension, hyperlipidemia and gout presents to the ER because of rectal bleeding. Patient started having rectal bleeding after dialysis yesterday. Patient also had abdominal pain mostly in the lower quadrants. Denies any nausea vomiting. Patient has been having pain in the abdomen which improves with bowel movements. Denies any recent sick contacts use of antibiotics or travel. Patient has had multiple episodes of rectal bleeding in the ER also. ER physician did discuss with on-call gastroenterologist Dr. Hilarie Fredrickson who will be seeing patient in consult. Patient has had similar episode in 2011 and colonoscopy had shown diverticulosis of the time. He denies said to repeat colonoscopy since then. Denies using any NSAIDs but does take aspirin. On exam patient has mild tenderness in the left lower quadrant. CT scan shows left-sided colitis from the splenic flexure down to the rectum.   ED Course: CT scan was done and CBC was checked and type and screen.  Review of Systems: As per HPI otherwise 10 point review of systems negative.    Past Medical History  Diagnosis Date  . Anxiety   . Blood transfusion   . Depression   . GERD (gastroesophageal reflux disease)   . Hyperlipidemia   . Hypertension   . Neuromuscular disorder (HCC)     neuropathy hand and legs  . Weight loss, unintentional   . Adenomatous polyp of colon 10/2010  . Diverticula, colon   . CHF (congestive heart failure) ([place])   . 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  Had deceased donor kidney transplant in 1996.  Had some early rejection  then stable function for years, then had slow decline of function and went back on hemodialysis in 2012.  Gets HD TTS schedule at [facility] on [facility] still using L forearm AVF.     Marland Kitchen Anemia in CKD (chronic kidney disease) 11/07/2012  . Arthritis   . Osteoporosis   . Stroke [facility])     TIA  . Pneumonia   . Headache   . Constipation     Past Surgical History  Procedure Laterality Date  . Kidney transplant  1996  . Back surgery    . Cervical fusion    . Av fistula placement      for dialysis  . Cholecystectomy  12/02/2012    Procedure: LAPAROSCOPIC CHOLECYSTECTOMY WITH INTRAOPERATIVE CHOLANGIOGRAM;  Surgeon: Edward Jolly, MD;  Location: [facility];  Service: General;  Laterality: N/A;  . Eye surgery Bilateral     cataract surgery  . Av fistula placement Left 11/22/2015    Procedure: ARTERIOVENOUS (AV) FISTULA CREATION-LEFT BRACHIOCEPHALIC;  Surgeon: Serafina Mitchell, MD;  Location: [facility] OR;  Service: Vascular;  Laterality: Left;  . Resection of arteriovenous fistula aneurysm Left 11/22/2015    Procedure: RESECTION OF LEFT RADIOCEPHALIC FISTULA ANEURYSM ;  Surgeon: Serafina Mitchell, MD;  Location: [place];  Service: Vascular;  Laterality: Left;     reports that she quit smoking about 24 years ago. Her smoking use included Cigarettes. She has never used smokeless tobacco. She reports that she does not drink alcohol or use illicit drugs.  Allergies  Allergen Reactions  . Sulfa Antibiotics Other (See Comments)    Both parents allergic-so will not take    Family History  Problem Relation Age of Onset  . Colon cancer Brother   . Cancer Brother   . Coronary artery disease Mother 82  . Hyperlipidemia Mother   . Hypertension Mother   . Esophageal cancer Neg Hx   . Stomach cancer Neg Hx   . Rectal cancer Neg Hx     Prior to Admission medications   Medication Sig Start Date End Date Taking? Authorizing Provider  acetaminophen (TYLENOL) 325 MG tablet Take 650 mg by mouth every 6 (six)  hours as needed for mild pain.   Yes Historical Provider, MD  allopurinol (ZYLOPRIM) 300 MG tablet Take 150 mg by mouth at bedtime.   Yes Historical Provider, MD  ALPRAZolam (XANAX) 0.25 MG tablet Take 0.25 mg by mouth 2 (two) times daily as needed for anxiety.    Yes Historical Provider, MD  aspirin EC 325 MG tablet Take 325 mg by mouth at bedtime.   Yes Historical Provider, MD  b complex-vitamin c-folic acid (NEPHRO-VITE) 0.8 MG TABS tablet Take 1 tablet by mouth at bedtime.   Yes Historical Provider, MD  calcium carbonate (TUMS) 500 MG chewable tablet Chew 500 mg by mouth 3 (three) times daily. Reported on 03/02/2016   Yes Historical Provider, MD  doxazosin (CARDURA) 8 MG tablet Take 1 tablet (8 mg total) by mouth 2 (two) times daily as needed (hypertension). 11/17/15  Yes Modena Jansky, MD  labetalol (NORMODYNE) 300 MG tablet Take 300 mg by mouth 2 (two) times daily. 11/09/12  Yes Velvet Bathe, MD  lactulose (CHRONULAC) 10 GM/15ML solution Take 20 g by mouth daily as needed for mild constipation.   Yes Historical Provider, MD  losartan (COZAAR) 100 MG tablet Take 100 mg by mouth at bedtime.    Yes Historical Provider, MD  promethazine (PHENERGAN) 25 MG tablet Take 25 mg by mouth every 6 (six) hours as needed for nausea or vomiting.   Yes Historical Provider, MD  simvastatin (ZOCOR) 10 MG tablet Take 10 mg by mouth at bedtime.    Yes Historical Provider, MD  zolpidem (AMBIEN) 10 MG tablet Take 10 mg by mouth at bedtime.   Yes Historical Provider, MD    Physical Exam: Filed Vitals:   05/14/16 2115 05/14/16 2230 05/15/16 0036 05/15/16 0100  BP: 167/98 179/91 152/89 173/96  Pulse: 76 73 73 71  Temp:      TempSrc:      Resp: 14 15 15 18   SpO2: 100% 100% 100% 100%      Constitutional: Not in distress. Filed Vitals:   05/14/16 2115 05/14/16 2230 05/15/16 0036 05/15/16 0100  BP: 167/98 179/91 152/89 173/96  Pulse: 76 73 73 71  Temp:      TempSrc:      Resp: 14 15 15 18   SpO2: 100%  100% 100% 100%   Eyes: Anicteric mild pallor. ENMT: No discharge from the ears eyes nose or mouth. Neck: No mass felt. No JVD appreciated. Respiratory: No rhonchi or crepitations. Cardiovascular: S1 and S2 heard. Abdomen: Mild tenderness in the left lower quadrant. No guarding or rigidity. Musculoskeletal: No edema. Skin: No rash. Neurologic: Alert awake oriented to time place and person. Moves all extremities. Psychiatric: Appears normal.   Labs on Admission: I have personally reviewed following labs and imaging studies  CBC:  Recent Labs Lab 05/14/16 1840 05/14/16 2304  WBC 5.3  --  HGB 11.7* 10.5*  HCT 37.5 33.9*  MCV 93.3  --   PLT 192  --    Basic Metabolic Panel:  Recent Labs Lab 05/14/16 1840  NA 131*  K 4.2  CL 91*  CO2 24  GLUCOSE 84  BUN 43*  CREATININE 6.16*  CALCIUM 10.0   GFR: CrCl cannot be calculated (Unknown ideal weight.). Liver Function Tests:  Recent Labs Lab 05/14/16 1840  AST 28  ALT 13*  ALKPHOS 140*  BILITOT 0.4  PROT 7.5  ALBUMIN 3.5   No results for input(s): LIPASE, AMYLASE in the last 168 hours. No results for input(s): AMMONIA in the last 168 hours. Coagulation Profile: No results for input(s): INR, PROTIME in the last 168 hours. Cardiac Enzymes: No results for input(s): CKTOTAL, CKMB, CKMBINDEX, TROPONINI in the last 168 hours. BNP (last 3 results) No results for input(s): PROBNP in the last 8760 hours. HbA1C: No results for input(s): HGBA1C in the last 72 hours. CBG: No results for input(s): GLUCAP in the last 168 hours. Lipid Profile: No results for input(s): CHOL, HDL, LDLCALC, TRIG, CHOLHDL, LDLDIRECT in the last 72 hours. Thyroid Function Tests: No results for input(s): TSH, T4TOTAL, FREET4, T3FREE, THYROIDAB in the last 72 hours. Anemia Panel: No results for input(s): VITAMINB12, FOLATE, FERRITIN, TIBC, IRON, RETICCTPCT in the last 72 hours. Urine analysis:    Component Value Date/Time   COLORURINE YELLOW  01/30/2011 1648   APPEARANCEUR CLOUDY* 01/30/2011 1648   LABSPEC 1.016 01/30/2011 1648   PHURINE 5.0 01/30/2011 1648   GLUCOSEU NEGATIVE 10/14/2009 0747   HGBUR SMALL* 01/30/2011 1648   BILIRUBINUR NEGATIVE 01/30/2011 1648   KETONESUR NEGATIVE 01/30/2011 1648   PROTEINUR 100* 01/30/2011 1648   UROBILINOGEN 0.2 01/30/2011 1648   NITRITE NEGATIVE 01/30/2011 1648   LEUKOCYTESUR SMALL* 01/30/2011 1648   Sepsis Labs: @LABRCNTIP (procalcitonin:4,lacticidven:4) )No results found for this or any previous visit (from the past 240 hour(s)).   Radiological Exams on Admission: Ct Abdomen Pelvis Wo Contrast  05/15/2016  CLINICAL DATA:  Lower abdominal pain and rectal bleeding. EXAM: CT ABDOMEN AND PELVIS WITHOUT CONTRAST TECHNIQUE: Multidetector CT imaging of the abdomen and pelvis was performed following the standard protocol without IV contrast. COMPARISON:  CT 07/21/2014 FINDINGS: Lower chest: The heart is enlarged. There are coronary artery calcifications. Small amount pericardial fluid. Peribronchial nodularity in both lung bases is chronic and unchanged. Liver: No focal hepatic lesion allowing for lack contrast. Hepatobiliary: Clips in the gallbladder fossa postcholecystectomy. No biliary dilatation. Pancreas: No ductal dilatation or inflammation. Pancreatic head partially obscured by adjacent bowel loops. Spleen: Normal. Adrenal glands: Not well visualized.  No evidence of adrenal mass. Kidneys: Native kidneys are atrophic. Multiple cysts throughout the left kidney are unchanged. Small cyst in the right kidney is not as well visualized on the current exam due to progressive atrophy. Multifocal renal calcifications felt to be vascular. Soft tissue density in the left iliac fossa are consistent with failed renal transplant appears similar to prior exam. Stomach/Bowel: Wall thickening of the distal esophagus and at the gastric cardia. There are no dilated or thickened small bowel loops. There is colonic wall  thickening extending from the rectum proximally to the it'll splenic flexure of the colon. Mild associated pericolonic edema. Diverticulosis of the distal colon without peridiverticular inflammation. The appendix is not confidently identified. Vascular/Lymphatic: Mildly enlarged retroperitoneal and pelvic lymph nodes are not significantly changed from prior. Left periaortic node measures 1.4 cm image 25 series 2, previously 1.3 cm. Smaller left periaortic nodes  more inferiorly are unchanged. Prominent left external iliac nodes are again seen. No evidence of progressive retroperitoneal or pelvic adenopathy. There is advanced atherosclerosis of the abdominal aorta and its branches. Tortuosity of the abdominal aorta without aneurysm. Reproductive: Unchanged uterine contours, lobular appearance, may reflect underlying fibroids. This is stable. No adnexal mass. Bladder: Completely decompressed. Other: No free air, free fluid, or intra-abdominal fluid collection. Suprapubic laxity of the anterior abdominal wall is unchanged. Musculoskeletal: There are no acute or suspicious osseous abnormalities. Stable anterolisthesis of L4 on L5 with inter spines fusion device. This is unchanged in appearance. Increased bone density suggesting renal osteodystrophy. Degenerative changes elsewhere in the spine are stable. IMPRESSION: 1. Colonic wall thickening from the splenic flexure through the rectum consistent with colitis. This may be infectious or inflammatory. Mild adjacent soft tissue stranding. 2. Additionally there is wall thickening in the distal esophagus and gastroesophageal junction. 3. Prominent retroperitoneal and pelvic lymph nodes are unchanged from exam early 2 years prior, suggesting reactive etiology. 4. Additional chronic findings are unchanged, as described. Electronically Signed   By: Jeb Levering M.D.   On: 05/15/2016 00:07   Dg Chest 2 View  05/14/2016  CLINICAL DATA:  Chest pain, soreness and flushing  beginning yesterday. Initial encounter. EXAM: CHEST  2 VIEW COMPARISON:  PA and lateral chest 12/10/2015 and 05/02/2014. CT chest 05/02/2014. FINDINGS: There is cardiomegaly without edema. Chronic micronodularity in the upper lobes is consistent with remote infectious or inflammatory process and unchanged. The lungs are otherwise clear. No pneumothorax or pleural effusion. IMPRESSION: Cardiomegaly without acute disease. Electronically Signed   By: Inge Rise M.D.   On: 05/14/2016 18:55     Assessment/Plan Principal Problem:   Rectal bleeding Active Problems:   Anemia in CKD (chronic kidney disease)   HTN (hypertension)   ESRD (end stage renal disease) on dialysis ([place])   Colitis    #1. Rectal bleeding - could see some diverticulosis of the patient's CAT scan does show colitis. Differentials include ischemic colitis versus infectious. At this time he has physician has already discussed with on-call gastroenterologist Dr. Hilarie Fredrickson will be seeing patient in consult. Patient will be kept nothing by mouth. Discontinue aspirin. Follow CBC and if that is hemoglobin less than 7 transfuse. #2. Hypertension - on Cozaar and labetalol which will be continued. #3. Chronic anemia probably from ESRD - hemoglobin appears to be at baseline closely follow CBC. #4. ESRD on hemodialysis on Tuesday Thursday and Saturday - has had dialysis yesterday. Please consult nephrology. #5. History of gout on allopurinol.    DVT prophylaxis: SCDs.  Code Status: Full code.  Family Communication: Family at the bedside.  Disposition Plan: Home.  Consults called: Copywriter, advertising by the ER physician.  Admission status: Inpatient. Stepdown. Likely stay 2 days.    Rise Patience MD [facility] Pager [phone].  If 7PM-7AM, please contact night-coverage [email or web address] Password [email or web address]  05/15/2016, 1:47 AM

## 2016-05-15 NOTE — Progress Notes (Signed)
Pharmacy Antibiotic Note  [name] is a 65 y.o. female admitted on 05/14/2016 with intra-abdominal infection.  Pharmacy has been consulted for Cipro dosing.  Plan: Cipro 400mg  IV Q24H.  Height: 5\' 2"  (157.5 cm) Weight: 133 lb 14.4 oz (60.737 kg) IBW/kg (Calculated) : 50.1  Temp (24hrs), Avg:98.1 F (36.7 C), Min:97.7 F (36.5 C), Max:98.5 F (36.9 C)   Recent Labs Lab 05/14/16 1840  WBC 5.3  CREATININE 6.16*    Estimated Creatinine Clearance: 7.8 mL/min (by C-G formula based on Cr of 6.16).    Allergies  Allergen Reactions  . Sulfa Antibiotics Other (See Comments)    Both parents allergic-so will not take     Thank you for allowing pharmacy to be a part of this patient's care.  Wynona Neat, PharmD, BCPS  05/15/2016 3:06 AM

## 2016-05-16 DIAGNOSIS — K559 Vascular disorder of intestine, unspecified: Secondary | ICD-10-CM

## 2016-05-16 DIAGNOSIS — N189 Chronic kidney disease, unspecified: Secondary | ICD-10-CM

## 2016-05-16 DIAGNOSIS — K55039 Acute (reversible) ischemia of large intestine, extent unspecified: Secondary | ICD-10-CM | POA: Diagnosis present

## 2016-05-16 DIAGNOSIS — I10 Essential (primary) hypertension: Secondary | ICD-10-CM

## 2016-05-16 DIAGNOSIS — D631 Anemia in chronic kidney disease: Secondary | ICD-10-CM

## 2016-05-16 LAB — CBC
HCT: 25.6 % — ABNORMAL LOW (ref 36.0–46.0)
HEMATOCRIT: 24.5 % — AB (ref 36.0–46.0)
HEMOGLOBIN: 8.2 g/dL — AB (ref 12.0–15.0)
Hemoglobin: 7.9 g/dL — ABNORMAL LOW (ref 12.0–15.0)
MCH: 28.9 pg (ref 26.0–34.0)
MCH: 28.7 pg (ref 26.0–34.0)
MCHC: 32 g/dL (ref 30.0–36.0)
MCHC: 32.2 g/dL (ref 30.0–36.0)
MCV: 90.1 fL (ref 78.0–100.0)
MCV: 89.1 fL (ref 78.0–100.0)
Platelets: 182 10*3/uL (ref 150–400)
Platelets: 184 10*3/uL (ref 150–400)
RBC: 2.84 MIL/uL — ABNORMAL LOW (ref 3.87–5.11)
RBC: 2.75 MIL/uL — ABNORMAL LOW (ref 3.87–5.11)
RDW: 18.6 % — ABNORMAL HIGH (ref 11.5–15.5)
RDW: 18.6 % — AB (ref 11.5–15.5)
WBC: 5.6 10*3/uL (ref 4.0–10.5)
WBC: 6 10*3/uL (ref 4.0–10.5)

## 2016-05-16 LAB — PREPARE RBC (CROSSMATCH)

## 2016-05-16 LAB — GLUCOSE, CAPILLARY
GLUCOSE-CAPILLARY: 121 mg/dL — AB (ref 65–99)
Glucose-Capillary: 124 mg/dL — ABNORMAL HIGH (ref 65–99)

## 2016-05-16 LAB — BASIC METABOLIC PANEL
Anion gap: 15 (ref 5–15)
BUN: 61 mg/dL — AB (ref 6–20)
CHLORIDE: 90 mmol/L — AB (ref 101–111)
CO2: 22 mmol/L (ref 22–32)
CREATININE: 8.43 mg/dL — AB (ref 0.44–1.00)
Calcium: 8.6 mg/dL — ABNORMAL LOW (ref 8.9–10.3)
GFR calc Af Amer: 5 mL/min — ABNORMAL LOW (ref >60)
GFR calc non Af Amer: 4 mL/min — ABNORMAL LOW (ref >60)
GLUCOSE: 82 mg/dL (ref 65–99)
Potassium: 4.6 mmol/L (ref 3.5–5.1)
SODIUM: 127 mmol/L — AB (ref 135–145)

## 2016-05-16 MED ORDER — CALCITRIOL 0.5 MCG PO CAPS: 1.2500 ug | ORAL_CAPSULE | ORAL | Status: DC

## 2016-05-16 MED ORDER — SODIUM CHLORIDE 0.9 % IV SOLN: Freq: Once | INTRAVENOUS | Status: DC

## 2016-05-16 MED ORDER — SODIUM CHLORIDE 0.9 % IV SOLN
125.0000 mg | INTRAVENOUS | Status: DC
Start: 2016-05-18 — End: 2016-05-17

## 2016-05-16 MED ORDER — CINACALCET HCL 30 MG PO TABS: 60.0000 mg | ORAL_TABLET | Freq: Every day | ORAL | Status: DC

## 2016-05-16 NOTE — Progress Notes (Signed)
Awaiting discharge reconcilliation completed before she can be discharged. Endorsed to next nurse.

## 2016-05-16 NOTE — Plan of Care (Signed)
Problem: Physical Regulation: Goal: Complications related to the disease process, condition or treatment will be avoided or minimized Outcome: Completed/Met Date Met:  41/58/30 Pt has no complications related to blood loss; denies dizziness, SOB, hypotension.

## 2016-05-16 NOTE — Consult Note (Signed)
[facility] Renal Consultation Note  Indication for Consultation:  Management of ESRD/hemodialysis; anemia, hypertension/volume and secondary hyperparathyroidism  HPI: [name] is a 65 y.o. female with ESRD Chronic HD MWF (missed yest HD sec to admit) notifed to see today / admitted with GIB with GI consult /eval = suspect ischemic colitis and could be having diverticular bleed. HGB op [facility] 05/13/16= 11.2 . On admit 05/14/16 hgb 11.7 and has dropped 10.5>9.9>9.0 >8.2 this am . No cos to me this am ,just finished Brk  "feel better."  Noted  Seen  by Dr. Joelyn Oms  With no complaints on 05/13/16 at [facility]  And noted to be leaving below her edw  With edw dropped 57 to 56 kg . BP stable .        Past Medical History  Diagnosis Date  . Depression with anxiety   . GERD (gastroesophageal reflux disease)   . Hyperlipidemia   . Hypertension   . Neuromuscular disorder (HCC)     neuropathy hand and legs  . Weight loss, unintentional   . Adenomatous polyp of colon 10/2010, 2006, 2015  . Diverticula, colon   . CHF (congestive heart failure) ([place])   . 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  Had deceased donor kidney transplant in 1996.  Had some early rejection then stable function for years, then had slow decline of function and went back on hemodialysis in 2012.  Gets HD TTS schedule at [facility] on [facility] still using L forearm AVF.     Marland Kitchen Anemia in CKD (chronic kidney disease) 11/07/2012    s/p blood transfusion.   . Arthritis   . Osteoporosis   . Stroke [facility]) 11/2015    TIA  . Pneumonia   . Headache   . Constipation     Past Surgical History  Procedure Laterality Date  . Kidney transplant  1996  . Back surgery    . Cervical fusion    . Av fistula placement      for dialysis  . Cholecystectomy  12/02/2012    Procedure: LAPAROSCOPIC CHOLECYSTECTOMY WITH  INTRAOPERATIVE CHOLANGIOGRAM;  Surgeon: Edward Jolly, MD;  Location: [place];  Service: General;  Laterality: N/A;  . Eye surgery Bilateral     cataract surgery  . Av fistula placement Left 11/22/2015    Procedure: ARTERIOVENOUS (AV) FISTULA CREATION-LEFT BRACHIOCEPHALIC;  Surgeon: Serafina Mitchell, MD;  Location: [facility] OR;  Service: Vascular;  Laterality: Left;  . Resection of arteriovenous fistula aneurysm Left 11/22/2015    Procedure: RESECTION OF LEFT RADIOCEPHALIC FISTULA ANEURYSM ;  Surgeon: Serafina Mitchell, MD;  Location: [facility] OR;  Service: Vascular;  Laterality: Left;      Family History  Problem Relation Age of Onset  . Colon cancer Brother   . Cancer Brother   . Coronary artery disease Mother 61  . Hyperlipidemia Mother   . Hypertension Mother   . Esophageal cancer Neg Hx   . Stomach cancer Neg Hx   . Rectal cancer Neg Hx       reports that she quit smoking about 24 years ago. Her smoking use included Cigarettes. She has never used smokeless tobacco. She reports that she does not drink alcohol or use illicit drugs.   Allergies  Allergen Reactions  . Sulfa Antibiotics Other (See Comments)    Both parents allergic-so will not take  Prior to Admission medications   Medication Sig Start Date End Date Taking? Authorizing Provider  acetaminophen (TYLENOL) 325 MG tablet Take 650 mg by mouth every 6 (six) hours as needed for mild pain.   Yes Historical Provider, MD  allopurinol (ZYLOPRIM) 300 MG tablet Take 150 mg by mouth at bedtime.   Yes Historical Provider, MD  ALPRAZolam (XANAX) 0.25 MG tablet Take 0.25 mg by mouth 2 (two) times daily as needed for anxiety.    Yes Historical Provider, MD  aspirin EC 325 MG tablet Take 325 mg by mouth at bedtime.   Yes Historical Provider, MD  b complex-vitamin c-folic acid (NEPHRO-VITE) 0.8 MG TABS tablet Take 1 tablet by mouth at bedtime.   Yes Historical Provider, MD  calcium carbonate (TUMS) 500 MG chewable tablet Chew 500 mg by mouth 3  (three) times daily. Reported on 03/02/2016   Yes Historical Provider, MD  doxazosin (CARDURA) 8 MG tablet Take 1 tablet (8 mg total) by mouth 2 (two) times daily as needed (hypertension). 11/17/15  Yes Modena Jansky, MD  labetalol (NORMODYNE) 300 MG tablet Take 300 mg by mouth 2 (two) times daily. 11/09/12  Yes Velvet Bathe, MD  lactulose (CHRONULAC) 10 GM/15ML solution Take 20 g by mouth daily as needed for mild constipation.   Yes Historical Provider, MD  losartan (COZAAR) 100 MG tablet Take 100 mg by mouth at bedtime.    Yes Historical Provider, MD  promethazine (PHENERGAN) 25 MG tablet Take 25 mg by mouth every 6 (six) hours as needed for nausea or vomiting.   Yes Historical Provider, MD  simvastatin (ZOCOR) 10 MG tablet Take 10 mg by mouth at bedtime.    Yes Historical Provider, MD  zolpidem (AMBIEN) 10 MG tablet Take 10 mg by mouth at bedtime.   Yes Historical Provider, MD    Continuous:   Results for orders placed or performed during the hospital encounter of 05/14/16 (from the past 48 hour(s))  Type and screen [place]     Status: None   Collection Time: 05/14/16  6:27 PM  Result Value Ref Range   ABO/RH(D) O POS    Antibody Screen NEG    Sample Expiration 05/17/2016   Comprehensive metabolic panel     Status: Abnormal   Collection Time: 05/14/16  6:40 PM  Result Value Ref Range   Sodium 131 (L) 135 - 145 mmol/L   Potassium 4.2 3.5 - 5.1 mmol/L   Chloride 91 (L) 101 - 111 mmol/L   CO2 24 22 - 32 mmol/L   Glucose, Bld 84 65 - 99 mg/dL   BUN 43 (H) 6 - 20 mg/dL   Creatinine, Ser 6.16 (H) 0.44 - 1.00 mg/dL   Calcium 10.0 8.9 - 10.3 mg/dL   Total Protein 7.5 6.5 - 8.1 g/dL   Albumin 3.5 3.5 - 5.0 g/dL   AST 28 15 - 41 U/L   ALT 13 (L) 14 - 54 U/L   Alkaline Phosphatase 140 (H) 38 - 126 U/L   Total Bilirubin 0.4 0.3 - 1.2 mg/dL   GFR calc non Af Amer 6 (L) >60 mL/min   GFR calc Af Amer 7 (L) >60 mL/min    Comment: (NOTE) The eGFR has been calculated  using the CKD EPI equation. This calculation has not been validated in all clinical situations. eGFR's persistently <60 mL/min signify possible Chronic Kidney Disease.    Anion gap 16 (H) 5 - 15  CBC     Status:  Abnormal   Collection Time: 05/14/16  6:40 PM  Result Value Ref Range   WBC 5.3 4.0 - 10.5 K/uL   RBC 4.02 3.87 - 5.11 MIL/uL   Hemoglobin 11.7 (L) 12.0 - 15.0 g/dL   HCT 37.5 36.0 - 46.0 %   MCV 93.3 78.0 - 100.0 fL   MCH 29.1 26.0 - 34.0 pg   MCHC 31.2 30.0 - 36.0 g/dL   RDW 18.8 (H) 11.5 - 15.5 %   Platelets 192 150 - 400 K/uL  I-stat troponin, ED     Status: None   Collection Time: 05/14/16  6:56 PM  Result Value Ref Range   Troponin i, poc 0.01 0.00 - 0.08 ng/mL   Comment 3            Comment: Due to the release kinetics of cTnI, a negative result within the first hours of the onset of symptoms does not rule out myocardial infarction with certainty. If myocardial infarction is still suspected, repeat the test at appropriate intervals.   POC occult blood, ED     Status: Abnormal   Collection Time: 05/14/16  8:34 PM  Result Value Ref Range   Fecal Occult Bld POSITIVE (A) NEGATIVE  Hemoglobin and hematocrit, blood     Status: Abnormal   Collection Time: 05/14/16 11:04 PM  Result Value Ref Range   Hemoglobin 10.5 (L) 12.0 - 15.0 g/dL   HCT 33.9 (L) 36.0 - 46.0 %  MRSA PCR Screening     Status: None   Collection Time: 05/15/16  2:05 AM  Result Value Ref Range   MRSA by PCR NEGATIVE NEGATIVE    Comment:        The GeneXpert MRSA Assay (FDA approved for NASAL specimens only), is one component of a comprehensive MRSA colonization surveillance program. It is not intended to diagnose MRSA infection nor to guide or monitor treatment for MRSA infections.   CBC     Status: Abnormal   Collection Time: 05/15/16  3:18 AM  Result Value Ref Range   WBC 6.1 4.0 - 10.5 K/uL   RBC 3.50 (L) 3.87 - 5.11 MIL/uL   Hemoglobin 9.9 (L) 12.0 - 15.0 g/dL   HCT 32.0 (L) 36.0  - 46.0 %   MCV 91.4 78.0 - 100.0 fL   MCH 28.3 26.0 - 34.0 pg   MCHC 30.9 30.0 - 36.0 g/dL   RDW 18.8 (H) 11.5 - 15.5 %   Platelets 170 150 - 400 K/uL  Basic metabolic panel     Status: Abnormal   Collection Time: 05/15/16  3:18 AM  Result Value Ref Range   Sodium 129 (L) 135 - 145 mmol/L   Potassium 4.0 3.5 - 5.1 mmol/L   Chloride 91 (L) 101 - 111 mmol/L   CO2 24 22 - 32 mmol/L   Glucose, Bld 86 65 - 99 mg/dL   BUN 48 (H) 6 - 20 mg/dL   Creatinine, Ser 6.81 (H) 0.44 - 1.00 mg/dL   Calcium 9.5 8.9 - 10.3 mg/dL   GFR calc non Af Amer 6 (L) >60 mL/min   GFR calc Af Amer 7 (L) >60 mL/min    Comment: (NOTE) The eGFR has been calculated using the CKD EPI equation. This calculation has not been validated in all clinical situations. eGFR's persistently <60 mL/min signify possible Chronic Kidney Disease.    Anion gap 14 5 - 15  CBC     Status: Abnormal   Collection Time: 05/15/16  7:07 AM  Result Value Ref Range   WBC 8.5 4.0 - 10.5 K/uL    Comment: WHITE COUNT CONFIRMED ON SMEAR   RBC 3.09 (L) 3.87 - 5.11 MIL/uL   Hemoglobin 9.0 (L) 12.0 - 15.0 g/dL   HCT 28.1 (L) 36.0 - 46.0 %   MCV 90.9 78.0 - 100.0 fL   MCH 29.1 26.0 - 34.0 pg   MCHC 32.0 30.0 - 36.0 g/dL   RDW 18.9 (H) 11.5 - 15.5 %   Platelets PLATELET CLUMPS NOTED ON SMEAR, UNABLE TO ESTIMATE 150 - 400 K/uL  Glucose, capillary     Status: Abnormal   Collection Time: 05/15/16 12:37 PM  Result Value Ref Range   Glucose-Capillary 131 (H) 65 - 99 mg/dL  Glucose, capillary     Status: Abnormal   Collection Time: 05/16/16 12:21 AM  Result Value Ref Range   Glucose-Capillary 121 (H) 65 - 99 mg/dL  CBC     Status: Abnormal   Collection Time: 05/16/16  2:36 AM  Result Value Ref Range   WBC 5.6 4.0 - 10.5 K/uL   RBC 2.84 (L) 3.87 - 5.11 MIL/uL   Hemoglobin 8.2 (L) 12.0 - 15.0 g/dL   HCT 25.6 (L) 36.0 - 46.0 %   MCV 90.1 78.0 - 100.0 fL   MCH 28.9 26.0 - 34.0 pg   MCHC 32.0 30.0 - 36.0 g/dL   RDW 18.6 (H) 11.5 - 15.5 %    Platelets 182 150 - 400 K/uL  Basic metabolic panel     Status: Abnormal   Collection Time: 05/16/16  2:36 AM  Result Value Ref Range   Sodium 127 (L) 135 - 145 mmol/L   Potassium 4.6 3.5 - 5.1 mmol/L   Chloride 90 (L) 101 - 111 mmol/L   CO2 22 22 - 32 mmol/L   Glucose, Bld 82 65 - 99 mg/dL   BUN 61 (H) 6 - 20 mg/dL   Creatinine, Ser 8.43 (H) 0.44 - 1.00 mg/dL   Calcium 8.6 (L) 8.9 - 10.3 mg/dL   GFR calc non Af Amer 4 (L) >60 mL/min   GFR calc Af Amer 5 (L) >60 mL/min    Comment: (NOTE) The eGFR has been calculated using the CKD EPI equation. This calculation has not been validated in all clinical situations. eGFR's persistently <60 mL/min signify possible Chronic Kidney Disease.    Anion gap 15 5 - 15    ROS: see hpi for positives/ but denies fever, chills, abd pain , cp, sob or dizziness.   Physical Exam: Filed Vitals:   05/16/16 0400 05/16/16 0801  BP: 130/69 132/77  Pulse:  70  Temp: 98.1 F (36.7 C) 97.7 F (36.5 C)  Resp: 20 20     General: ALert AAf NAD , Calm  HEENT: [place] , MMM, [place]   Neck: supple, no jvd Heart: RRR, No rub, mur, gal Lungs: CTA , non labored breathing  Abdomen: BS pos . soft , NT,ND Extremities:No pedal edema Skin: no overt rash or pedal ulcers  Neuro: OX3, no acute focal deficits apparent  Dialysis Access: LUA AVF wit some aneurysmal developement  Dialysis Orders: [facility]  on M[facility] . EDW 56 rescent decr from 78  HD Bath 2k,2ca  Time 4hr Heparin 3000. Access LUA AVF BFR 350  DFR 1.5af     PO Calcitriol 1.25 mcg /HD   Mirecera 150 mcg q 2wk last on 5/31    Units IV/HD  Venofer  156m load q hd last on 06/03/16  Other  OP labs hgb 11.2   5/31 Ca 9.9 phos 5.1 PTH 1093<1248 (senspar nocomplia)  Assessment/Plan 1. GIB with suspect ischemic colitis and could be having diverticular bleed. NO HEP . HD,  Will reck HGB on hd  Give prbcs if drop below 7 / other plan Per GI / 2. ESRD -  HD schedule MWF  HD today sec missed hd yest.    3. Hypertension/volume  - no excess vol on exam , fu BP and wts standing / Keep sbp >110 on hd to prevent ischemic bowel / on labetalol/ hydralazine if bp drop on HD  Need to taper off bp meds  To prevent ischemic bowel  4. Anemia of ESRD and GIB- esa just received 5/31, fe load as opunit  5. Metabolic bone disease -  Po vit d on hd . tums binder, sensipar par  6. Nutrition - renal diet , renal vit   Ernest Haber, PA-C [place] [phone] 05/16/2016, 9:23 AM   Pt seen, examined and agree w A/P as above.  Kelly Splinter MD [facility] pager [phone]    cell [phone] 05/16/2016, 4:37 PM

## 2016-05-16 NOTE — Plan of Care (Signed)
Problem: Bowel/Gastric: Goal: Will show no signs and symptoms of gastrointestinal bleeding Outcome: Completed/Met Date Met:  05/16/16 No more bloody stools.     

## 2016-05-16 NOTE — Progress Notes (Signed)
Pt discharged home at 2100, transported via wheelchair to main entrance with tech, and security brought her to her car.  Pt left with purse and discharge instructions.  Pt was instructed to make her follow up appointment within one week, medications were discussed, and GIB education.  IV removed, discharge papers left with patient and CCMD notified of pt discharge.

## 2016-05-16 NOTE — Discharge Instructions (Signed)
Gastrointestinal Bleeding °Gastrointestinal bleeding is bleeding somewhere along the path that food travels through the body (digestive tract). This path is anywhere between the mouth and the opening of the butt (anus). You may have blood in your throw up (vomit) or in your poop (stools). If there is a lot of bleeding, you may need to stay in the hospital. °HOME CARE °· Only take medicine as told by your doctor. °· Eat foods with fiber such as whole grains, fruits, and vegetables. You can also try eating 1 to 3 prunes a day. °· Drink enough fluids to keep your pee (urine) clear or pale yellow. °GET HELP RIGHT AWAY IF:  °· Your bleeding gets worse. °· You feel dizzy, weak, or you pass out (faint). °· You have bad cramps in your back or belly (abdomen). °· You have large blood clumps (clots) in your poop. °· Your problems are getting worse. °MAKE SURE YOU:  °· Understand these instructions. °· Will watch your condition. °· Will get help right away if you are not doing well or get worse. °  °This information is not intended to replace advice given to you by your health care provider. Make sure you discuss any questions you have with your health care provider. °  °Document Released: 09/08/2008 Document Revised: 11/16/2012 Document Reviewed: 05/20/2015 °Elsevier Interactive Patient Education ©2016 [employer] Inc[employer] ° °

## 2016-05-16 NOTE — Progress Notes (Signed)
Daily Rounding Note  05/16/2016, 8:10 AM  LOS: 1 day   SUBJECTIVE:       Feels much better "like a new person".  Small amount of blood in soft, brown stool x 2 yesterday.   None this AM.  Some vague burning in lower abdomen.  No nausea.  Tolerating soft diet.   OBJECTIVE:         Vital signs in last 24 hours:    Temp:  [97.6 F (36.4 C)-98.1 F (36.7 C)] 97.7 F (36.5 C) (06/03 0801) Pulse Rate:  [58-84] 70 (06/03 0801) Resp:  [10-27] 20 (06/03 0801) BP: (85-134)/(59-86) 132/77 mmHg (06/03 0801) SpO2:  [98 %-100 %] 98 % (06/03 0801) Weight:  [60.5 kg (133 lb 6.1 oz)] 60.5 kg (133 lb 6.1 oz) (06/03 0500) Last BM Date: 05/15/16 Filed Weights   05/15/16 0200 05/16/16 0500  Weight: 60.737 kg (133 lb 14.4 oz) 60.5 kg (133 lb 6.1 oz)   General: pleasant, comfortable.  Looks well.     Heart: RRR.  No mrg Chest: clear bil Abdomen: soft, NT, ND, active BS.  Extremities: no CCE Neuro/Psych:  Oriented x 2.  No tremor or gross weakness/deficits.   Intake/Output from previous day:    Intake/Output this shift:    Lab Results:  Recent Labs  05/15/16 0318 05/15/16 0707 05/16/16 0236  WBC 6.1 8.5 5.6  HGB 9.9* 9.0* 8.2*  HCT 32.0* 28.1* 25.6*  PLT 170 PLATELET CLUMPS NOTED ON SMEAR, UNABLE TO ESTIMATE 182   BMET  Recent Labs  05/14/16 1840 05/15/16 0318 05/16/16 0236  NA 131* 129* 127*  K 4.2 4.0 4.6  CL 91* 91* 90*  CO2 24 24 22   GLUCOSE 84 86 82  BUN 43* 48* 61*  CREATININE 6.16* 6.81* 8.43*  CALCIUM 10.0 9.5 8.6*   LFT  Recent Labs  05/14/16 1840  PROT 7.5  ALBUMIN 3.5  AST 28  ALT 13*  ALKPHOS 140*  BILITOT 0.4   PT/INR No results for input(s): LABPROT, INR in the last 72 hours. Hepatitis Panel No results for input(s): HEPBSAG, HCVAB, HEPAIGM, HEPBIGM in the last 72 hours.  Studies/Results: Ct Abdomen Pelvis Wo Contrast  05/15/2016  CLINICAL DATA:  Lower abdominal pain and rectal  bleeding. EXAM: CT ABDOMEN AND PELVIS WITHOUT CONTRAST TECHNIQUE: Multidetector CT imaging of the abdomen and pelvis was performed following the standard protocol without IV contrast. COMPARISON:  CT 07/21/2014 FINDINGS: Lower chest: The heart is enlarged. There are coronary artery calcifications. Small amount pericardial fluid. Peribronchial nodularity in both lung bases is chronic and unchanged. Liver: No focal hepatic lesion allowing for lack contrast. Hepatobiliary: Clips in the gallbladder fossa postcholecystectomy. No biliary dilatation. Pancreas: No ductal dilatation or inflammation. Pancreatic head partially obscured by adjacent bowel loops. Spleen: Normal. Adrenal glands: Not well visualized.  No evidence of adrenal mass. Kidneys: Native kidneys are atrophic. Multiple cysts throughout the left kidney are unchanged. Small cyst in the right kidney is not as well visualized on the current exam due to progressive atrophy. Multifocal renal calcifications felt to be vascular. Soft tissue density in the left iliac fossa are consistent with failed renal transplant appears similar to prior exam. Stomach/Bowel: Wall thickening of the distal esophagus and at the gastric cardia. There are no dilated or thickened small bowel loops. There is colonic wall thickening extending from the rectum proximally to the it'll splenic flexure of the colon. Mild associated pericolonic edema. Diverticulosis of the  distal colon without peridiverticular inflammation. The appendix is not confidently identified. Vascular/Lymphatic: Mildly enlarged retroperitoneal and pelvic lymph nodes are not significantly changed from prior. Left periaortic node measures 1.4 cm image 25 series 2, previously 1.3 cm. Smaller left periaortic nodes more inferiorly are unchanged. Prominent left external iliac nodes are again seen. No evidence of progressive retroperitoneal or pelvic adenopathy. There is advanced atherosclerosis of the abdominal aorta and its  branches. Tortuosity of the abdominal aorta without aneurysm. Reproductive: Unchanged uterine contours, lobular appearance, may reflect underlying fibroids. This is stable. No adnexal mass. Bladder: Completely decompressed. Other: No free air, free fluid, or intra-abdominal fluid collection. Suprapubic laxity of the anterior abdominal wall is unchanged. Musculoskeletal: There are no acute or suspicious osseous abnormalities. Stable anterolisthesis of L4 on L5 with inter spines fusion device. This is unchanged in appearance. Increased bone density suggesting renal osteodystrophy. Degenerative changes elsewhere in the spine are stable. IMPRESSION: 1. Colonic wall thickening from the splenic flexure through the rectum consistent with colitis. This may be infectious or inflammatory. Mild adjacent soft tissue stranding. 2. Additionally there is wall thickening in the distal esophagus and gastroesophageal junction. 3. Prominent retroperitoneal and pelvic lymph nodes are unchanged from exam early 2 years prior, suggesting reactive etiology. 4. Additional chronic findings are unchanged, as described. Electronically Signed   By: Jeb Levering M.D.   On: 05/15/2016 00:07   Dg Chest 2 View  05/14/2016  CLINICAL DATA:  Chest pain, soreness and flushing beginning yesterday. Initial encounter. EXAM: CHEST  2 VIEW COMPARISON:  PA and lateral chest 12/10/2015 and 05/02/2014. CT chest 05/02/2014. FINDINGS: There is cardiomegaly without edema. Chronic micronodularity in the upper lobes is consistent with remote infectious or inflammatory process and unchanged. The lungs are otherwise clear. No pneumothorax or pleural effusion. IMPRESSION: Cardiomegaly without acute disease. Electronically Signed   By: Inge Rise M.D.   On: 05/14/2016 18:55    ASSESMENT:   * Left sided colitis with hematochezia and pain. Suspect ischemic colitis. Alternatively could be diverticular bleed.  Bleeding nearly resolved.  Pain gone.  *  Esophagitis by CT, not on PPI at home.   * ABL anemia.  Steadily declining H&H.  To get PRBC in HD this AM.  On weekly iron and Mircera at HD.    * ESRD. 1996 renal transplant which eventually failed. HD MWF  * Hx recurrent adenomatous polyps of colon: 2006, 2011, 2015. Up to date on surveillance studies.     PLAN   *  Discharge home after HD today.  Advance to [facility] diet. Follow up with GI prn (Nandigam, Danis or Armbruster)   [name]  05/16/2016, 8:10 AM Pager: [phone]    Attending physician's note   I have taken an interval history, reviewed the chart and examined the patient. I agree with the Advanced Practitioner's note, impression and recommendations. Resolving ischemic colitis. Advance diet. Maintain adequate intravascular volumes and blood pressure.Vascular disease and volume changes with HD likely contributing.OK for discharge today from GI standpoint. GI follow up prn as above. GI signing off.   Lucio Edward, MD [place] [phone] Mon-Fri 8a-5p [phone] after 5p, weekends, holidays

## 2016-05-16 NOTE — Discharge Summary (Signed)
Physician Discharge Summary  [name] [id number] DOB: 09-08-1951 DOA: 05/14/2016  PCP: Kandice Hams, MD  Admit date: 05/14/2016 Discharge date: 05/16/2016  Time spent: 25 minutes  Recommendations for Outpatient Follow-up:  Discharge home with outpatient PCP follow-up in one week. Monitor H&H. Follow up with her scheduled dialysis as outpatient.  Discharge Diagnoses:  Principal Problem:   Acute ischemic colitis ([place])   Active Problems:   Anemia in CKD (chronic kidney disease)   HTN (hypertension)   ESRD (end stage renal disease) on dialysis [facility])   Rectal bleeding   Discharge Condition: Fair  Diet recommendation: Renal  Filed Weights   05/15/16 0200 05/16/16 0500  Weight: 60.737 kg (133 lb 14.4 oz) 60.5 kg (133 lb 6.1 oz)    History of present illness:  65 y.o. female with ESRD on hemodialysis on M,W,F, hypertension, hyperlipidemia and gout presents to the ER with acute rectal bleeding after her last dialysis. Also complained of some abdominal pain in bilateral lower quadrants. Denied any nausea vomiting.  Denied any recent sick contacts use of antibiotics or travel. Patient has had multiple episodes of rectal bleeding in the ER also.  Patient has had similar episode in 2011 and colonoscopy had shown diverticulosis of the time. . Denies using any NSAIDs but does take aspirin. On exam patient has mild tenderness in the left lower quadrant. CT scan shows left-sided colitis from the splenic flexure down to the rectum.  Patient vitals in the ED were stable. She had multiple episodes of rectal bleed in the ED. GI was consulted and admitted to hospitalist service.  Hospital Course:  Acute lower GI bleed with suspected ischemic colitis Seen by [facility] and recommend that her underlying renal disease and dialysis with injections and intravascular volume likely contributing to her symptoms. Patient provided with bowel rest, pain control and blood pressure maintained. Placed  on clear liquid. She has not had further GI bleed since yesterday. Denies further abdominal pain. Hemoglobin has remained stable. GI recommends maintaining adequately intravascular volumes. Patient stable to be discharged home.  ESRD on dialysis Patient last dialysis was on 5/31. Renal notified and will be dialyzed today. Continue home medications. She can be discharged home after dialysis.  Essential hypertension Stable. Continue losartan, labetalol and Cardura.  Hyperlipidemia Continue statin.   Procedures:  CT abdomen and pelvis  Consultations:  [facility]  Renal  Discharge Exam: Filed Vitals:   05/16/16 0400 05/16/16 0801  BP: 130/69 132/77  Pulse:  70  Temp: 98.1 F (36.7 C) 97.7 F (36.5 C)  Resp: 20 20    General: Middle aged female not in distress HEENT: Pallor present, moist mucosa, supple neck Chest: Clear bilaterally CVS: Normal S1 and S2, no murmurs rub or gallop GI: Soft, distended, nontender, bowel sounds present Musculoskeletal: Warm, no edema, LUE AVF CNS: Alert and oriented   Discharge Instructions    Current Discharge Medication List    CONTINUE these medications which have NOT CHANGED   Details  acetaminophen (TYLENOL) 325 MG tablet Take 650 mg by mouth every 6 (six) hours as needed for mild pain.    allopurinol (ZYLOPRIM) 300 MG tablet Take 150 mg by mouth at bedtime.    ALPRAZolam (XANAX) 0.25 MG tablet Take 0.25 mg by mouth 2 (two) times daily as needed for anxiety.     aspirin EC 325 MG tablet Take 325 mg by mouth at bedtime.    b complex-vitamin c-folic acid (NEPHRO-VITE) 0.8 MG TABS tablet Take 1 tablet by  mouth at bedtime.    calcium carbonate (TUMS) 500 MG chewable tablet Chew 500 mg by mouth 3 (three) times daily. Reported on 03/02/2016    doxazosin (CARDURA) 8 MG tablet Take 1 tablet (8 mg total) by mouth 2 (two) times daily as needed (hypertension).    labetalol (NORMODYNE) 300 MG tablet Take 300 mg by mouth 2 (two) times  daily.    lactulose (CHRONULAC) 10 GM/15ML solution Take 20 g by mouth daily as needed for mild constipation.    losartan (COZAAR) 100 MG tablet Take 100 mg by mouth at bedtime.     promethazine (PHENERGAN) 25 MG tablet Take 25 mg by mouth every 6 (six) hours as needed for nausea or vomiting.    simvastatin (ZOCOR) 10 MG tablet Take 10 mg by mouth at bedtime.     zolpidem (AMBIEN) 10 MG tablet Take 10 mg by mouth at bedtime.       Allergies  Allergen Reactions  . Sulfa Antibiotics Other (See Comments)    Both parents allergic-so will not take   Follow-up Information    Follow up with POLITE,RONALD D, MD. Schedule an appointment as soon as possible for a visit in 1 week.   Specialty:  Internal Medicine   Contact information:   [address] [place] [address] [phone]        The results of significant diagnostics from this hospitalization (including imaging, microbiology, ancillary and laboratory) are listed below for reference.    Significant Diagnostic Studies: Ct Abdomen Pelvis Wo Contrast  05/15/2016  CLINICAL DATA:  Lower abdominal pain and rectal bleeding. EXAM: CT ABDOMEN AND PELVIS WITHOUT CONTRAST TECHNIQUE: Multidetector CT imaging of the abdomen and pelvis was performed following the standard protocol without IV contrast. COMPARISON:  CT 07/21/2014 FINDINGS: Lower chest: The heart is enlarged. There are coronary artery calcifications. Small amount pericardial fluid. Peribronchial nodularity in both lung bases is chronic and unchanged. Liver: No focal hepatic lesion allowing for lack contrast. Hepatobiliary: Clips in the gallbladder fossa postcholecystectomy. No biliary dilatation. Pancreas: No ductal dilatation or inflammation. Pancreatic head partially obscured by adjacent bowel loops. Spleen: Normal. Adrenal glands: Not well visualized.  No evidence of adrenal mass. Kidneys: Native kidneys are atrophic. Multiple cysts throughout the left kidney are  unchanged. Small cyst in the right kidney is not as well visualized on the current exam due to progressive atrophy. Multifocal renal calcifications felt to be vascular. Soft tissue density in the left iliac fossa are consistent with failed renal transplant appears similar to prior exam. Stomach/Bowel: Wall thickening of the distal esophagus and at the gastric cardia. There are no dilated or thickened small bowel loops. There is colonic wall thickening extending from the rectum proximally to the it'll splenic flexure of the colon. Mild associated pericolonic edema. Diverticulosis of the distal colon without peridiverticular inflammation. The appendix is not confidently identified. Vascular/Lymphatic: Mildly enlarged retroperitoneal and pelvic lymph nodes are not significantly changed from prior. Left periaortic node measures 1.4 cm image 25 series 2, previously 1.3 cm. Smaller left periaortic nodes more inferiorly are unchanged. Prominent left external iliac nodes are again seen. No evidence of progressive retroperitoneal or pelvic adenopathy. There is advanced atherosclerosis of the abdominal aorta and its branches. Tortuosity of the abdominal aorta without aneurysm. Reproductive: Unchanged uterine contours, lobular appearance, may reflect underlying fibroids. This is stable. No adnexal mass. Bladder: Completely decompressed. Other: No free air, free fluid, or intra-abdominal fluid collection. Suprapubic laxity of the anterior abdominal wall  is unchanged. Musculoskeletal: There are no acute or suspicious osseous abnormalities. Stable anterolisthesis of L4 on L5 with inter spines fusion device. This is unchanged in appearance. Increased bone density suggesting renal osteodystrophy. Degenerative changes elsewhere in the spine are stable. IMPRESSION: 1. Colonic wall thickening from the splenic flexure through the rectum consistent with colitis. This may be infectious or inflammatory. Mild adjacent soft tissue  stranding. 2. Additionally there is wall thickening in the distal esophagus and gastroesophageal junction. 3. Prominent retroperitoneal and pelvic lymph nodes are unchanged from exam early 2 years prior, suggesting reactive etiology. 4. Additional chronic findings are unchanged, as described. Electronically Signed   By: Jeb Levering M.D.   On: 05/15/2016 00:07   Dg Chest 2 View  05/14/2016  CLINICAL DATA:  Chest pain, soreness and flushing beginning yesterday. Initial encounter. EXAM: CHEST  2 VIEW COMPARISON:  PA and lateral chest 12/10/2015 and 05/02/2014. CT chest 05/02/2014. FINDINGS: There is cardiomegaly without edema. Chronic micronodularity in the upper lobes is consistent with remote infectious or inflammatory process and unchanged. The lungs are otherwise clear. No pneumothorax or pleural effusion. IMPRESSION: Cardiomegaly without acute disease. Electronically Signed   By: Inge Rise M.D.   On: 05/14/2016 18:55    Microbiology: Recent Results (from the past 240 hour(s))  MRSA PCR Screening     Status: None   Collection Time: 05/15/16  2:05 AM  Result Value Ref Range Status   MRSA by PCR NEGATIVE NEGATIVE Final    Comment:        The GeneXpert MRSA Assay (FDA approved for NASAL specimens only), is one component of a comprehensive MRSA colonization surveillance program. It is not intended to diagnose MRSA infection nor to guide or monitor treatment for MRSA infections.      Labs: Basic Metabolic Panel:  Recent Labs Lab 05/14/16 1840 05/15/16 0318 05/16/16 0236  NA 131* 129* 127*  K 4.2 4.0 4.6  CL 91* 91* 90*  CO2 24 24 22   GLUCOSE 84 86 82  BUN 43* 48* 61*  CREATININE 6.16* 6.81* 8.43*  CALCIUM 10.0 9.5 8.6*   Liver Function Tests:  Recent Labs Lab 05/14/16 1840  AST 28  ALT 13*  ALKPHOS 140*  BILITOT 0.4  PROT 7.5  ALBUMIN 3.5   No results for input(s): LIPASE, AMYLASE in the last 168 hours. No results for input(s): AMMONIA in the last 168  hours. CBC:  Recent Labs Lab 05/14/16 1840 05/14/16 2304 05/15/16 0318 05/15/16 0707 05/16/16 0236  WBC 5.3  --  6.1 8.5 5.6  HGB 11.7* 10.5* 9.9* 9.0* 8.2*  HCT 37.5 33.9* 32.0* 28.1* 25.6*  MCV 93.3  --  91.4 90.9 90.1  PLT 192  --  170 PLATELET CLUMPS NOTED ON SMEAR, UNABLE TO ESTIMATE 182   Cardiac Enzymes: No results for input(s): CKTOTAL, CKMB, CKMBINDEX, TROPONINI in the last 168 hours. BNP: BNP (last 3 results) No results for input(s): BNP in the last 8760 hours.  ProBNP (last 3 results) No results for input(s): PROBNP in the last 8760 hours.  CBG:  Recent Labs Lab 05/15/16 1237 05/16/16 0021  GLUCAP 131* 121*       Signed:  Louellen Molder MD.  [facility] 05/16/2016, 11:54 AM

## 2016-05-16 NOTE — Plan of Care (Signed)
Problem: Fluid Volume: Goal: Will show no signs and symptoms of excessive bleeding Outcome: Completed/Met Date Met:  05/16/16 Hgb dropped, pt received 1 unit of blood, no more signs of bleeding.

## 2016-05-17 LAB — TYPE AND SCREEN
ABO/RH(D): O POS
ANTIBODY SCREEN: NEGATIVE
Unit division: 0

## 2016-05-18 DIAGNOSIS — D631 Anemia in chronic kidney disease: Secondary | ICD-10-CM | POA: Diagnosis not present

## 2016-05-18 DIAGNOSIS — D509 Iron deficiency anemia, unspecified: Secondary | ICD-10-CM | POA: Diagnosis not present

## 2016-05-18 DIAGNOSIS — N2581 Secondary hyperparathyroidism of renal origin: Secondary | ICD-10-CM | POA: Diagnosis not present

## 2016-05-18 DIAGNOSIS — N186 End stage renal disease: Secondary | ICD-10-CM | POA: Diagnosis not present

## 2016-05-19 DIAGNOSIS — Z780 Asymptomatic menopausal state: Secondary | ICD-10-CM | POA: Diagnosis not present

## 2016-05-20 DIAGNOSIS — D631 Anemia in chronic kidney disease: Secondary | ICD-10-CM | POA: Diagnosis not present

## 2016-05-20 DIAGNOSIS — N2581 Secondary hyperparathyroidism of renal origin: Secondary | ICD-10-CM | POA: Diagnosis not present

## 2016-05-20 DIAGNOSIS — M20012 Mallet finger of left finger(s): Secondary | ICD-10-CM | POA: Diagnosis not present

## 2016-05-20 DIAGNOSIS — D509 Iron deficiency anemia, unspecified: Secondary | ICD-10-CM | POA: Diagnosis not present

## 2016-05-20 DIAGNOSIS — N186 End stage renal disease: Secondary | ICD-10-CM | POA: Diagnosis not present

## 2016-05-21 DIAGNOSIS — N186 End stage renal disease: Secondary | ICD-10-CM | POA: Diagnosis not present

## 2016-05-21 DIAGNOSIS — I10 Essential (primary) hypertension: Secondary | ICD-10-CM | POA: Diagnosis not present

## 2016-05-21 DIAGNOSIS — I2510 Atherosclerotic heart disease of native coronary artery without angina pectoris: Secondary | ICD-10-CM | POA: Diagnosis not present

## 2016-05-21 DIAGNOSIS — K529 Noninfective gastroenteritis and colitis, unspecified: Secondary | ICD-10-CM | POA: Diagnosis not present

## 2016-05-21 DIAGNOSIS — K921 Melena: Secondary | ICD-10-CM | POA: Diagnosis not present

## 2016-05-21 DIAGNOSIS — K922 Gastrointestinal hemorrhage, unspecified: Secondary | ICD-10-CM | POA: Diagnosis not present

## 2016-05-21 DIAGNOSIS — D638 Anemia in other chronic diseases classified elsewhere: Secondary | ICD-10-CM | POA: Diagnosis not present

## 2016-05-21 DIAGNOSIS — E782 Mixed hyperlipidemia: Secondary | ICD-10-CM | POA: Diagnosis not present

## 2016-05-22 DIAGNOSIS — N2581 Secondary hyperparathyroidism of renal origin: Secondary | ICD-10-CM | POA: Diagnosis not present

## 2016-05-22 DIAGNOSIS — N186 End stage renal disease: Secondary | ICD-10-CM | POA: Diagnosis not present

## 2016-05-22 DIAGNOSIS — D631 Anemia in chronic kidney disease: Secondary | ICD-10-CM | POA: Diagnosis not present

## 2016-05-22 DIAGNOSIS — D509 Iron deficiency anemia, unspecified: Secondary | ICD-10-CM | POA: Diagnosis not present

## 2016-05-25 DIAGNOSIS — D631 Anemia in chronic kidney disease: Secondary | ICD-10-CM | POA: Diagnosis not present

## 2016-05-25 DIAGNOSIS — N186 End stage renal disease: Secondary | ICD-10-CM | POA: Diagnosis not present

## 2016-05-25 DIAGNOSIS — D509 Iron deficiency anemia, unspecified: Secondary | ICD-10-CM | POA: Diagnosis not present

## 2016-05-25 DIAGNOSIS — N2581 Secondary hyperparathyroidism of renal origin: Secondary | ICD-10-CM | POA: Diagnosis not present

## 2016-05-26 DIAGNOSIS — M20012 Mallet finger of left finger(s): Secondary | ICD-10-CM | POA: Diagnosis not present

## 2016-05-26 DIAGNOSIS — M79645 Pain in left finger(s): Secondary | ICD-10-CM | POA: Diagnosis not present

## 2016-05-27 DIAGNOSIS — D509 Iron deficiency anemia, unspecified: Secondary | ICD-10-CM | POA: Diagnosis not present

## 2016-05-27 DIAGNOSIS — N186 End stage renal disease: Secondary | ICD-10-CM | POA: Diagnosis not present

## 2016-05-27 DIAGNOSIS — N2581 Secondary hyperparathyroidism of renal origin: Secondary | ICD-10-CM | POA: Diagnosis not present

## 2016-05-27 DIAGNOSIS — D631 Anemia in chronic kidney disease: Secondary | ICD-10-CM | POA: Diagnosis not present

## 2016-05-28 DIAGNOSIS — Z1231 Encounter for screening mammogram for malignant neoplasm of breast: Secondary | ICD-10-CM | POA: Diagnosis not present

## 2016-05-29 DIAGNOSIS — N2581 Secondary hyperparathyroidism of renal origin: Secondary | ICD-10-CM | POA: Diagnosis not present

## 2016-05-29 DIAGNOSIS — D631 Anemia in chronic kidney disease: Secondary | ICD-10-CM | POA: Diagnosis not present

## 2016-05-29 DIAGNOSIS — N186 End stage renal disease: Secondary | ICD-10-CM | POA: Diagnosis not present

## 2016-05-29 DIAGNOSIS — D509 Iron deficiency anemia, unspecified: Secondary | ICD-10-CM | POA: Diagnosis not present

## 2016-06-01 DIAGNOSIS — D509 Iron deficiency anemia, unspecified: Secondary | ICD-10-CM | POA: Diagnosis not present

## 2016-06-01 DIAGNOSIS — N2581 Secondary hyperparathyroidism of renal origin: Secondary | ICD-10-CM | POA: Diagnosis not present

## 2016-06-01 DIAGNOSIS — N186 End stage renal disease: Secondary | ICD-10-CM | POA: Diagnosis not present

## 2016-06-01 DIAGNOSIS — D631 Anemia in chronic kidney disease: Secondary | ICD-10-CM | POA: Diagnosis not present

## 2016-06-03 DIAGNOSIS — N2581 Secondary hyperparathyroidism of renal origin: Secondary | ICD-10-CM | POA: Diagnosis not present

## 2016-06-03 DIAGNOSIS — D509 Iron deficiency anemia, unspecified: Secondary | ICD-10-CM | POA: Diagnosis not present

## 2016-06-03 DIAGNOSIS — D631 Anemia in chronic kidney disease: Secondary | ICD-10-CM | POA: Diagnosis not present

## 2016-06-03 DIAGNOSIS — N186 End stage renal disease: Secondary | ICD-10-CM | POA: Diagnosis not present

## 2016-06-05 DIAGNOSIS — D631 Anemia in chronic kidney disease: Secondary | ICD-10-CM | POA: Diagnosis not present

## 2016-06-05 DIAGNOSIS — N2581 Secondary hyperparathyroidism of renal origin: Secondary | ICD-10-CM | POA: Diagnosis not present

## 2016-06-05 DIAGNOSIS — D509 Iron deficiency anemia, unspecified: Secondary | ICD-10-CM | POA: Diagnosis not present

## 2016-06-05 DIAGNOSIS — N186 End stage renal disease: Secondary | ICD-10-CM | POA: Diagnosis not present

## 2016-06-08 DIAGNOSIS — D631 Anemia in chronic kidney disease: Secondary | ICD-10-CM | POA: Diagnosis not present

## 2016-06-08 DIAGNOSIS — N2581 Secondary hyperparathyroidism of renal origin: Secondary | ICD-10-CM | POA: Diagnosis not present

## 2016-06-08 DIAGNOSIS — D509 Iron deficiency anemia, unspecified: Secondary | ICD-10-CM | POA: Diagnosis not present

## 2016-06-08 DIAGNOSIS — N186 End stage renal disease: Secondary | ICD-10-CM | POA: Diagnosis not present

## 2016-06-09 DIAGNOSIS — M20012 Mallet finger of left finger(s): Secondary | ICD-10-CM | POA: Diagnosis not present

## 2016-06-10 DIAGNOSIS — N2581 Secondary hyperparathyroidism of renal origin: Secondary | ICD-10-CM | POA: Diagnosis not present

## 2016-06-10 DIAGNOSIS — D631 Anemia in chronic kidney disease: Secondary | ICD-10-CM | POA: Diagnosis not present

## 2016-06-10 DIAGNOSIS — N186 End stage renal disease: Secondary | ICD-10-CM | POA: Diagnosis not present

## 2016-06-10 DIAGNOSIS — D509 Iron deficiency anemia, unspecified: Secondary | ICD-10-CM | POA: Diagnosis not present

## 2016-06-11 ENCOUNTER — Emergency Department (HOSPITAL_COMMUNITY)
Admission: EM | Admit: 2016-06-11 | Discharge: 2016-06-11 | Disposition: A | Discharge status: Home / Self Care | Payer: Medicare Other | Attending: Emergency Medicine | Admitting: Emergency Medicine

## 2016-06-11 ENCOUNTER — Encounter (HOSPITAL_COMMUNITY): Payer: Self-pay | Admitting: Emergency Medicine

## 2016-06-11 ENCOUNTER — Emergency Department (HOSPITAL_COMMUNITY): Payer: Medicare Other

## 2016-06-11 DIAGNOSIS — Y929 Unspecified place or not applicable: Secondary | ICD-10-CM | POA: Diagnosis not present

## 2016-06-11 DIAGNOSIS — S0990XA Unspecified injury of head, initial encounter: Secondary | ICD-10-CM | POA: Diagnosis present

## 2016-06-11 DIAGNOSIS — I509 Heart failure, unspecified: Secondary | ICD-10-CM | POA: Diagnosis not present

## 2016-06-11 DIAGNOSIS — Z87891 Personal history of nicotine dependence: Secondary | ICD-10-CM | POA: Insufficient documentation

## 2016-06-11 DIAGNOSIS — R220 Localized swelling, mass and lump, head: Secondary | ICD-10-CM | POA: Diagnosis not present

## 2016-06-11 DIAGNOSIS — Z79899 Other long term (current) drug therapy: Secondary | ICD-10-CM | POA: Insufficient documentation

## 2016-06-11 DIAGNOSIS — Z8673 Personal history of transient ischemic attack (TIA), and cerebral infarction without residual deficits: Secondary | ICD-10-CM | POA: Insufficient documentation

## 2016-06-11 DIAGNOSIS — Y939 Activity, unspecified: Secondary | ICD-10-CM | POA: Insufficient documentation

## 2016-06-11 DIAGNOSIS — Z7982 Long term (current) use of aspirin: Secondary | ICD-10-CM | POA: Diagnosis not present

## 2016-06-11 DIAGNOSIS — Z940 Kidney transplant status: Secondary | ICD-10-CM | POA: Diagnosis not present

## 2016-06-11 DIAGNOSIS — E785 Hyperlipidemia, unspecified: Secondary | ICD-10-CM | POA: Diagnosis not present

## 2016-06-11 DIAGNOSIS — S0083XA Contusion of other part of head, initial encounter: Secondary | ICD-10-CM | POA: Insufficient documentation

## 2016-06-11 DIAGNOSIS — N186 End stage renal disease: Secondary | ICD-10-CM | POA: Insufficient documentation

## 2016-06-11 DIAGNOSIS — M1612 Unilateral primary osteoarthritis, left hip: Secondary | ICD-10-CM | POA: Insufficient documentation

## 2016-06-11 DIAGNOSIS — Z992 Dependence on renal dialysis: Secondary | ICD-10-CM | POA: Insufficient documentation

## 2016-06-11 DIAGNOSIS — I132 Hypertensive heart and chronic kidney disease with heart failure and with stage 5 chronic kidney disease, or end stage renal disease: Secondary | ICD-10-CM | POA: Insufficient documentation

## 2016-06-11 DIAGNOSIS — W228XXA Striking against or struck by other objects, initial encounter: Secondary | ICD-10-CM | POA: Insufficient documentation

## 2016-06-11 DIAGNOSIS — Y999 Unspecified external cause status: Secondary | ICD-10-CM | POA: Diagnosis not present

## 2016-06-11 DIAGNOSIS — M25552 Pain in left hip: Secondary | ICD-10-CM | POA: Diagnosis not present

## 2016-06-11 MED ORDER — ACETAMINOPHEN-CODEINE #3 300-30 MG PO TABS: 1.0000 | ORAL_TABLET | Freq: Four times a day (QID) | ORAL | Status: DC | PRN

## 2016-06-11 NOTE — ED Provider Notes (Signed)
CSN: [id number]     Arrival date & time 06/11/16  [id number] History   First MD Initiated Contact with Patient 06/11/16 1001     Chief Complaint  Patient presents with  . Head Injury     (Consider location/radiation/quality/duration/timing/severity/associated sxs/prior Treatment) HPI   65 year old female with history of osteoporosis, prior stroke, end-stage renal disease currently on M-W-F dialysis presenting for evaluation of a prior fall. Pt report 3 nights ago, while reaching for her remote control that she has accidentally dropped to the ground she strike her head against  a space heater next to her bed.  Pt did not fall of the bed or strike any other body part.  Denies LOC, no precipitating sxs.  Afterward, she report localized pain without dizziness, nausea, or increased fatigue. Denies changes of vision, difficulty thinking, new weakness, abnormal coordination, dizziness or fatigue.  Pt have not missed any dialysis appointment.  Report pain is 4/10, no specific treatment tried aside from icing it once.  Pt has hx of hip arthritis and states her hip pain has been ongoing for months, not worsen since the fall. Pain is primarily to her L hip, worsening with movement. Sts this is something that has been ongoing since her stroke in December, which affected her left side.   Currently taking ASA but no other anticoagulants.     Past Medical History  Diagnosis Date  . Depression with anxiety   . GERD (gastroesophageal reflux disease)   . Hyperlipidemia   . Hypertension   . Neuromuscular disorder (HCC)     neuropathy hand and legs  . Weight loss, unintentional   . Adenomatous polyp of colon 10/2010, 2006, 2015  . Diverticula, colon   . CHF (congestive heart failure) ([place])   . 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  Had deceased donor kidney transplant in 1996.  Had some early rejection then stable function for  years, then had slow decline of function and went back on hemodialysis in 2012.  Gets HD TTS schedule at [facility] still using L forearm AVF.     Marland Kitchen Anemia in CKD (chronic kidney disease) 11/07/2012    s/p blood transfusion.   . Arthritis   . Osteoporosis   . Stroke [facility]) 11/2015    TIA  . Pneumonia   . Headache   . Constipation    Past Surgical History  Procedure Laterality Date  . Kidney transplant  1996  . Back surgery    . Cervical fusion    . Av fistula placement      for dialysis  . Cholecystectomy  12/02/2012    Procedure: LAPAROSCOPIC CHOLECYSTECTOMY WITH INTRAOPERATIVE CHOLANGIOGRAM;  Surgeon: Edward Jolly, MD;  Location: [place];  Service: General;  Laterality: N/A;  . Eye surgery Bilateral     cataract surgery  . Av fistula placement Left 11/22/2015    Procedure: ARTERIOVENOUS (AV) FISTULA CREATION-LEFT BRACHIOCEPHALIC;  Surgeon: Serafina Mitchell, MD;  Location: [facility] OR;  Service: Vascular;  Laterality: Left;  . 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 Colon cancer Brother   . Cancer Brother   . Coronary artery disease Mother 41  . Hyperlipidemia  Mother   . Hypertension Mother   . Esophageal cancer Neg Hx   . Stomach cancer Neg Hx   . Rectal cancer Neg Hx    Social History  Substance Use Topics  . Smoking status: Former Smoker    Types: Cigarettes    Quit date: 12/31/1991  . Smokeless tobacco: Never Used  . Alcohol Use: No   OB History    No data available     Review of Systems  All other systems reviewed and are negative.     Allergies  Sulfa antibiotics  Home Medications   Prior to Admission medications   Medication Sig Start Date End Date Taking? Authorizing Provider  acetaminophen (TYLENOL) 325 MG tablet  Take 650 mg by mouth every 6 (six) hours as needed for mild pain.    Historical Provider, MD  allopurinol (ZYLOPRIM) 300 MG tablet Take 150 mg by mouth at bedtime.    Historical Provider, MD  ALPRAZolam Duanne Moron) 0.25 MG tablet Take 0.25 mg by mouth 2 (two) times daily as needed for anxiety.     Historical Provider, MD  aspirin EC 325 MG tablet Take 325 mg by mouth at bedtime.    Historical Provider, MD  b complex-vitamin c-folic acid (NEPHRO-VITE) 0.8 MG TABS tablet Take 1 tablet by mouth at bedtime.    Historical Provider, MD  calcium carbonate (TUMS) 500 MG chewable tablet Chew 500 mg by mouth 3 (three) times daily. Reported on 03/02/2016    Historical Provider, MD  doxazosin (CARDURA) 8 MG tablet Take 1 tablet (8 mg total) by mouth 2 (two) times daily as needed (hypertension). 11/17/15   Modena Jansky, MD  labetalol (NORMODYNE) 300 MG tablet Take 300 mg by mouth 2 (two) times daily. 11/09/12   Velvet Bathe, MD  lactulose (CHRONULAC) 10 GM/15ML solution Take 20 g by mouth daily as needed for mild constipation.    Historical Provider, MD  losartan (COZAAR) 100 MG tablet Take 100 mg by mouth at bedtime.     Historical Provider, MD  promethazine (PHENERGAN) 25 MG tablet Take 25 mg by mouth every 6 (six) hours as needed for nausea or vomiting.    Historical Provider, MD  simvastatin (ZOCOR) 10 MG tablet Take 10 mg by mouth at bedtime.     Historical Provider, MD  zolpidem (AMBIEN) 10 MG tablet Take 10 mg by mouth at bedtime.    Historical Provider, MD   BP 167/97 mmHg  Pulse 79  Temp(Src) 98.6 F (37 C) (Oral)  Resp 18  SpO2 95% Physical Exam  Constitutional: She is oriented to person, place, and time. She appears well-developed and well-nourished. No distress.  HENT:  Head: Normocephalic.  Right Ear: External ear normal.  Left Ear: External ear normal.  Mouth/Throat: Oropharynx is clear and moist.  A moderate size ecchymosis noted to the left forehead with dependent ecchymotic changes  around the left orbital region with tenderness to palpation but no crepitus or obvious deformity noted. No laceration noted.  Eyes: Conjunctivae and EOM are normal. Pupils are equal, round, and reactive to light.  Neck: Normal range of motion. Neck supple.  No cervical midline spine tenderness.  Cardiovascular: Normal rate and regular rhythm.   Pulmonary/Chest: Effort normal and breath sounds normal.  Abdominal: Soft. There is no tenderness.  Musculoskeletal: She exhibits tenderness (Left hip: Mild tenderness noted to left inguinal region on palpation with normal hip flexion extension abduction and abduction and no gross deformity. No overlying skin changes.).  Neurological: She  is alert and oriented to person, place, and time. She has normal strength. No cranial nerve deficit or sensory deficit. She displays a negative Romberg sign. Coordination and gait normal. GCS eye subscore is 4. GCS verbal subscore is 5. GCS motor subscore is 6.  Skin: No rash noted.  Psychiatric: She has a normal mood and affect.  Nursing note and vitals reviewed.   ED Course  Procedures (including critical care time) Labs Review Labs Reviewed - No data to display  Imaging Review Ct Head Wo Contrast  06/11/2016  CLINICAL DATA:  Loss of balance fallen off of bed on Monday now with hematoma to the forehead. Headache. No loss of consciousness. History of hypertension and stroke. EXAM: CT HEAD WITHOUT CONTRAST TECHNIQUE: Contiguous axial images were obtained from the base of the skull through the vertex without intravenous contrast. COMPARISON:  11/16/2015; brain MRI - 11/16/2015 FINDINGS: Brain: Similar findings of atrophy with sulcal prominence. Scattered periventricular hypodensities compatible microvascular ischemic disease. Unchanged lacunar infarcts within the right basal ganglia (image 14, series 2), the right centrum semiovale (image 19, series 2) as well as the right-side of the pons (image 11). Given background  parenchymal abnormalities, there is no CT evidence of superimposed acute large territory infarct. No intraparenchymal or extra-axial mass or hemorrhage. Unchanged size a configuration of the ventricles and basilar cisterns. No midline shift. Vascular: Intracranial atherosclerosis. Skull: No fracture. Sinuses/Orbits: There is under pneumatization of left frontal sinus. The remaining paranasal sinuses and mastoid air cells are normally aerated. No air-fluid levels. Post bilateral cataract surgery. Other: There is asymmetric soft tissue swelling about the left side of the forehead measuring approximately 4.9 x 0.8 cm. No associated radiopaque foreign body or displaced calvarial fracture. IMPRESSION: 1. Soft tissue swelling about the left side of the forehead without associated radiopaque foreign body, displaced calvarial fracture or acute intracranial process. 2. Similar findings of atrophy and microvascular ischemic disease. Electronically Signed   By: Sandi Mariscal M.D.   On: 06/11/2016 11:45   Dg Hip Unilat With Pelvis 2-3 Views Left  06/11/2016  CLINICAL DATA:  Chronic left anterior hip pain EXAM: DG HIP (WITH OR WITHOUT PELVIS) 2-3V LEFT COMPARISON:  CT of the abdomen and pelvis 05/14/2016 FINDINGS: Three views of the left hip submitted. No acute fracture or subluxation. Bilateral hip joints are symmetrical in appearance. Stable mild cystic degenerative changes left anterior acetabulum. IMPRESSION: No acute fracture or subluxation. Electronically Signed   By: Lahoma Crocker M.D.   On: 06/11/2016 11:43   I have personally reviewed and evaluated these images and lab results as part of my medical decision-making.   EKG Interpretation None      MDM   Final diagnoses:  Minor head injury, initial encounter  Forehead contusion, initial encounter  Arthritis of left hip    BP 184/102 mmHg  Pulse 80  Temp(Src) 98.6 F (37 C) (Oral)  Resp 15  SpO2 96% The patient was noted to be hypertensive today in the  emergency department. I have spoken with the patient regarding hypertension and the need for improved management. I instructed the patient to followup with the Primary care doctor within 4 days to improve the management of the patient's hypertension. I also counseled the patient regarding the signs and symptoms which would require an emergent visit to an emergency department for hypertensive urgency and/or hypertensive emergency. The patient understood the need for improved hypertensive management.   10:53 AM Patient injured her forehead 3 nights ago. This is a  mechanical injury. She does have ecchymotic changes to the affected area and given her age, I will obtain a head CT scan to rule out fracture although my suspicion is low. No suspicion for intracranial head bleed. Patient is neurovascularly intact, mentating appropriately and in no acute distress. She also complaining of ongoing left hip pain since a stroke back in December. No worsening symptoms but she was concerned about the pain, therefore I will obtain an x-ray for further evaluation.    12:22 PM Head CT and L hip xray are without acute finding.  Reassurance given.  Care instruction for ecchymosis and contusion were discussed.  Return precaution given.  Care discussed with DR. Linker who has seen pt and agrees with plan. Domenic Moras, PA-C 06/11/16 Sueanne Maniaci, MD 06/11/16 1225

## 2016-06-11 NOTE — Discharge Instructions (Signed)
Facial or Scalp Contusion ° A facial or scalp contusion is a deep bruise on the face or head. Contusions happen when an injury causes bleeding under the skin. Signs of bruising include pain, puffiness (swelling), and discolored skin. The contusion may turn blue, purple, or yellow. °HOME CARE °· Only take medicines as told by your doctor. °· Put ice on the injured area. °¨ Put ice in a plastic bag. °¨ Place a towel between your skin and the bag. °¨ Leave the ice on for 20 minutes, 2-3 times a day. °GET HELP IF: °· You have bite problems. °· You have pain when chewing. °· You are worried about your face not healing normally. °GET HELP RIGHT AWAY IF:  °· You have severe pain or a headache and medicine does not help. °· You are very tired or confused, or your personality changes. °· You throw up (vomit). °· You have a nosebleed that will not stop. °· You see two of everything (double vision) or have blurry vision. °· You have fluid coming from your nose or ear. °· You have problems walking or using your arms or legs. °MAKE SURE YOU:  °· Understand these instructions. °· Will watch your condition. °· Will get help right away if you are not doing well or get worse. °  °This information is not intended to replace advice given to you by your health care provider. Make sure you discuss any questions you have with your health care provider. °  °Document Released: 11/19/2011 Document Revised: 12/21/2014 Document Reviewed: 07/13/2013 °Elsevier Interactive Patient Education ©2016 [employer] ° °

## 2016-06-11 NOTE — ED Notes (Signed)
Patient alert and oriented at discharge.  Patient verbalized understanding of discharge instructions.  Patient assisted to the waiting room with this RN.

## 2016-06-11 NOTE — ED Notes (Signed)
Pt sts feel off bed and hit head on heater 2 days ago; bruising noted; pt sts mild HA today; pt dialysis pt with last dialysis yesterday

## 2016-06-12 DIAGNOSIS — T8612 Kidney transplant failure: Secondary | ICD-10-CM | POA: Diagnosis not present

## 2016-06-12 DIAGNOSIS — D509 Iron deficiency anemia, unspecified: Secondary | ICD-10-CM | POA: Diagnosis not present

## 2016-06-12 DIAGNOSIS — Z992 Dependence on renal dialysis: Secondary | ICD-10-CM | POA: Diagnosis not present

## 2016-06-12 DIAGNOSIS — D631 Anemia in chronic kidney disease: Secondary | ICD-10-CM | POA: Diagnosis not present

## 2016-06-12 DIAGNOSIS — N186 End stage renal disease: Secondary | ICD-10-CM | POA: Diagnosis not present

## 2016-06-12 DIAGNOSIS — N2581 Secondary hyperparathyroidism of renal origin: Secondary | ICD-10-CM | POA: Diagnosis not present

## 2016-06-15 DIAGNOSIS — D631 Anemia in chronic kidney disease: Secondary | ICD-10-CM | POA: Diagnosis not present

## 2016-06-15 DIAGNOSIS — N186 End stage renal disease: Secondary | ICD-10-CM | POA: Diagnosis not present

## 2016-06-15 DIAGNOSIS — N2581 Secondary hyperparathyroidism of renal origin: Secondary | ICD-10-CM | POA: Diagnosis not present

## 2016-06-17 DIAGNOSIS — N2581 Secondary hyperparathyroidism of renal origin: Secondary | ICD-10-CM | POA: Diagnosis not present

## 2016-06-17 DIAGNOSIS — D631 Anemia in chronic kidney disease: Secondary | ICD-10-CM | POA: Diagnosis not present

## 2016-06-17 DIAGNOSIS — N186 End stage renal disease: Secondary | ICD-10-CM | POA: Diagnosis not present

## 2016-06-19 DIAGNOSIS — N2581 Secondary hyperparathyroidism of renal origin: Secondary | ICD-10-CM | POA: Diagnosis not present

## 2016-06-19 DIAGNOSIS — D631 Anemia in chronic kidney disease: Secondary | ICD-10-CM | POA: Diagnosis not present

## 2016-06-19 DIAGNOSIS — N186 End stage renal disease: Secondary | ICD-10-CM | POA: Diagnosis not present

## 2016-06-22 DIAGNOSIS — N2581 Secondary hyperparathyroidism of renal origin: Secondary | ICD-10-CM | POA: Diagnosis not present

## 2016-06-22 DIAGNOSIS — N186 End stage renal disease: Secondary | ICD-10-CM | POA: Diagnosis not present

## 2016-06-22 DIAGNOSIS — D631 Anemia in chronic kidney disease: Secondary | ICD-10-CM | POA: Diagnosis not present

## 2016-06-23 DIAGNOSIS — M20012 Mallet finger of left finger(s): Secondary | ICD-10-CM | POA: Diagnosis not present

## 2016-06-23 DIAGNOSIS — N63 Unspecified lump in breast: Secondary | ICD-10-CM | POA: Diagnosis not present

## 2016-06-24 ENCOUNTER — Emergency Department (HOSPITAL_COMMUNITY): Payer: Medicare Other

## 2016-06-24 ENCOUNTER — Emergency Department (HOSPITAL_COMMUNITY)
Admission: EM | Admit: 2016-06-24 | Discharge: 2016-06-24 | Disposition: A | Discharge status: Home / Self Care | Payer: Medicare Other | Attending: Emergency Medicine | Admitting: Emergency Medicine

## 2016-06-24 ENCOUNTER — Encounter (HOSPITAL_COMMUNITY): Payer: Self-pay | Admitting: Emergency Medicine

## 2016-06-24 DIAGNOSIS — Z992 Dependence on renal dialysis: Secondary | ICD-10-CM | POA: Diagnosis not present

## 2016-06-24 DIAGNOSIS — Z7982 Long term (current) use of aspirin: Secondary | ICD-10-CM | POA: Insufficient documentation

## 2016-06-24 DIAGNOSIS — D631 Anemia in chronic kidney disease: Secondary | ICD-10-CM | POA: Diagnosis not present

## 2016-06-24 DIAGNOSIS — R5383 Other fatigue: Secondary | ICD-10-CM | POA: Diagnosis present

## 2016-06-24 DIAGNOSIS — N186 End stage renal disease: Secondary | ICD-10-CM | POA: Insufficient documentation

## 2016-06-24 DIAGNOSIS — Z8673 Personal history of transient ischemic attack (TIA), and cerebral infarction without residual deficits: Secondary | ICD-10-CM | POA: Insufficient documentation

## 2016-06-24 DIAGNOSIS — I509 Heart failure, unspecified: Secondary | ICD-10-CM | POA: Insufficient documentation

## 2016-06-24 DIAGNOSIS — I132 Hypertensive heart and chronic kidney disease with heart failure and with stage 5 chronic kidney disease, or end stage renal disease: Secondary | ICD-10-CM | POA: Diagnosis not present

## 2016-06-24 DIAGNOSIS — Z87891 Personal history of nicotine dependence: Secondary | ICD-10-CM | POA: Diagnosis not present

## 2016-06-24 DIAGNOSIS — R42 Dizziness and giddiness: Secondary | ICD-10-CM | POA: Diagnosis not present

## 2016-06-24 DIAGNOSIS — N2581 Secondary hyperparathyroidism of renal origin: Secondary | ICD-10-CM | POA: Diagnosis not present

## 2016-06-24 DIAGNOSIS — Z79899 Other long term (current) drug therapy: Secondary | ICD-10-CM | POA: Insufficient documentation

## 2016-06-24 LAB — CBC WITH DIFFERENTIAL/PLATELET
Basophils Absolute: 0 10*3/uL (ref 0.0–0.1)
Basophils Relative: 0 %
Eosinophils Absolute: 0.2 10*3/uL (ref 0.0–0.7)
Eosinophils Relative: 3 %
HEMATOCRIT: 33.2 % — AB (ref 36.0–46.0)
HEMOGLOBIN: 10.7 g/dL — AB (ref 12.0–15.0)
LYMPHS ABS: 1.9 10*3/uL (ref 0.7–4.0)
LYMPHS PCT: 35 %
MCH: 29.6 pg (ref 26.0–34.0)
MCHC: 32.2 g/dL (ref 30.0–36.0)
MCV: 92 fL (ref 78.0–100.0)
MONO ABS: 0.4 10*3/uL (ref 0.1–1.0)
MONOS PCT: 7 %
NEUTROS ABS: 2.9 10*3/uL (ref 1.7–7.7)
NEUTROS PCT: 55 %
Platelets: 179 10*3/uL (ref 150–400)
RBC: 3.61 MIL/uL — ABNORMAL LOW (ref 3.87–5.11)
RDW: 18.3 % — ABNORMAL HIGH (ref 11.5–15.5)
WBC: 5.4 10*3/uL (ref 4.0–10.5)

## 2016-06-24 LAB — BASIC METABOLIC PANEL
Anion gap: 13 (ref 5–15)
BUN: 33 mg/dL — AB (ref 6–20)
CHLORIDE: 92 mmol/L — AB (ref 101–111)
CO2: 27 mmol/L (ref 22–32)
CREATININE: 6.05 mg/dL — AB (ref 0.44–1.00)
Calcium: 9 mg/dL (ref 8.9–10.3)
GFR calc non Af Amer: 7 mL/min — ABNORMAL LOW (ref >60)
GFR, EST AFRICAN AMERICAN: 8 mL/min — AB (ref >60)
GLUCOSE: 86 mg/dL (ref 65–99)
Potassium: 3.8 mmol/L (ref 3.5–5.1)
Sodium: 132 mmol/L — ABNORMAL LOW (ref 135–145)

## 2016-06-24 IMAGING — CR DG CHEST 1V PORT
1 series · 1 of 1 positions shown · non-contrast
Comparison: 01/22/2015 chest radiograph.

CLINICAL DATA: CVA

EXAM:
PORTABLE CHEST 1 VIEW

[AP]
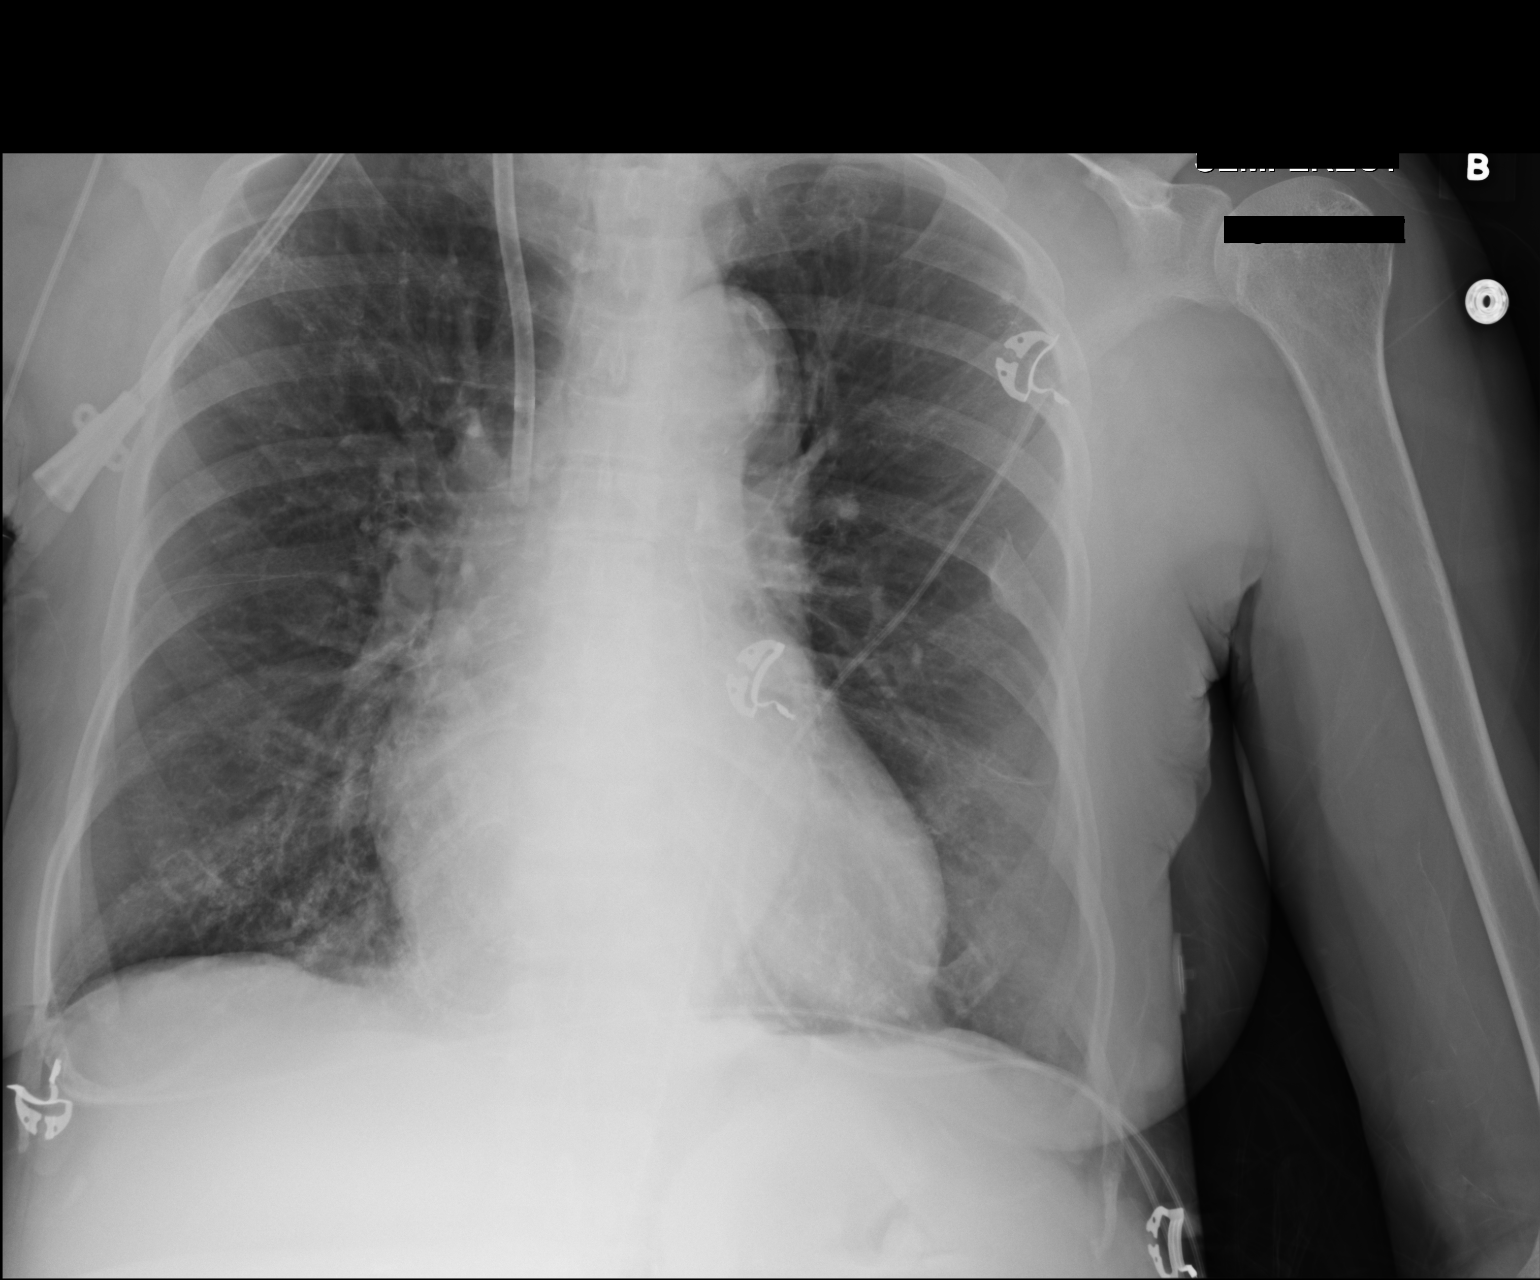

[1 of 1 positions shown; findings below may reference images not displayed]

FINDINGS: Right internal jugular central venous catheter terminates in the
middle third of the superior vena cava. Stable cardiomediastinal
silhouette with mild cardiomegaly. No pneumothorax. No pleural
effusion. There are nonspecific mild dense reticular opacities in
the peripheral upper and basilar right lung, unchanged since
04/01/2013 chest CT angiogram, with an appearance most suggestive of
tiny parenchymal calcifications such as from prior barium aspiration
or granulomatous infection. No pulmonary edema. No new focal lung
opacity.
IMPRESSION: 1. Right internal jugular central venous catheter terminates in the
middle third of the SVC. No pneumothorax.
2. Stable mild cardiomegaly without overt pulmonary edema.

## 2016-06-24 IMAGING — MR MR HEAD W/O CM
9 of 11 series · 32 of 48 positions shown · non-contrast
Comparison: CT head earlier today

CLINICAL DATA: Left-sided facial numbness. End-stage renal disease.
History of hypertension.

EXAM:
MRI HEAD WITHOUT CONTRAST
TECHNIQUE: Multiplanar, multiecho pulse sequences of the brain and surrounding
structures were obtained without intravenous contrast.

[Series 4: DWI · axial · 3.6mm · 0.94mm/px · z∈[-135,-3]mm · 8 of 78 slices shown (1 of 4)]
[im 1/78]
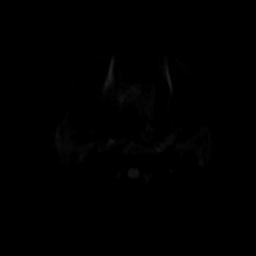
[im 12/78]
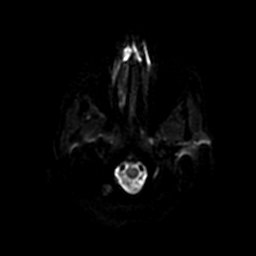
[im 23/78]
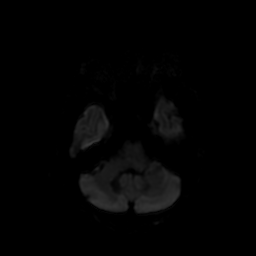
[im 34/78]
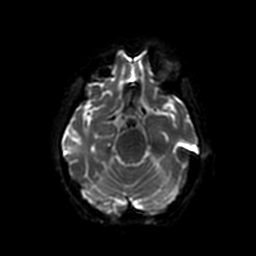
[im 45/78]
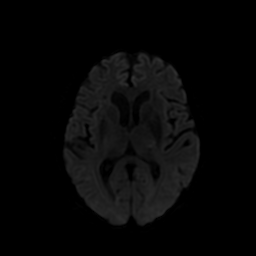
[im 56/78]
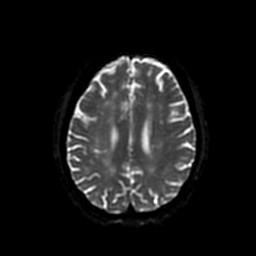
[im 67/78]
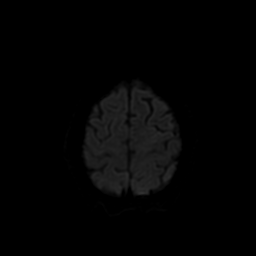
[im 78/78]
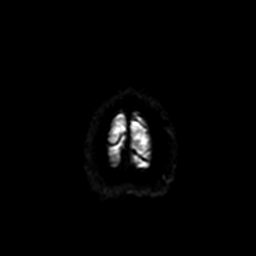

[Series 5: FLAIR · sagittal · 5.0mm · 0.47mm/px · 2 of 24 slices shown (1 of 2)]
[im 1/24]
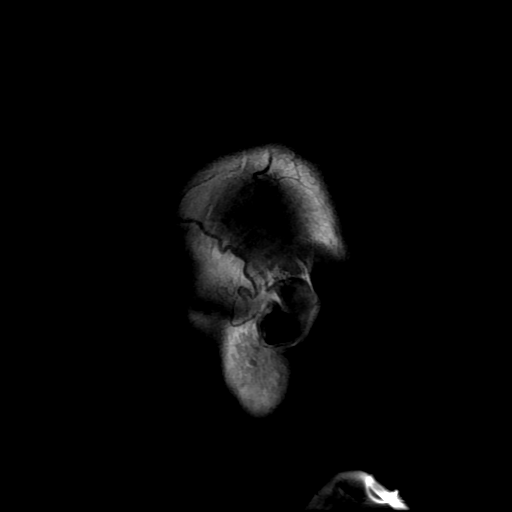
[im 24/24]
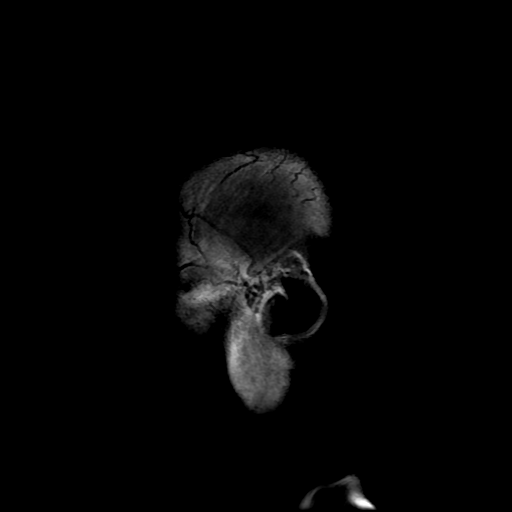

[Series 6: T2 · axial · 5.0mm · 0.47mm/px · z∈[-137,+3]mm · 2 of 25 slices shown]
[im 1/25]
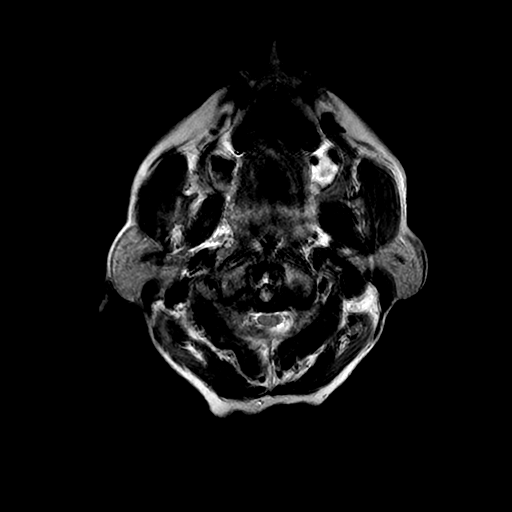
[im 25/25]
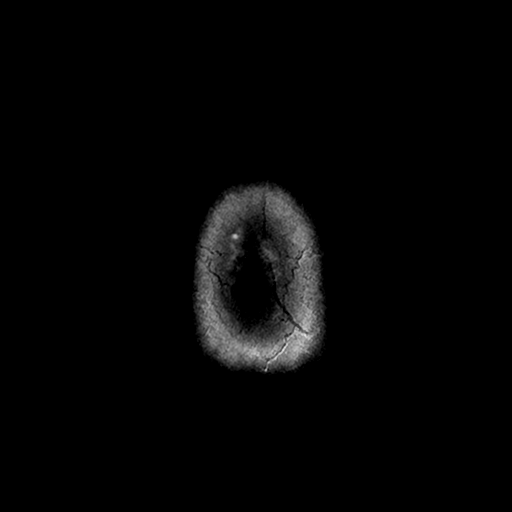

[Series 7: FLAIR · axial · 5.0mm · 0.47mm/px · z∈[-137,+3]mm · 2 of 25 slices shown (2 of 2)]
[im 1/25]
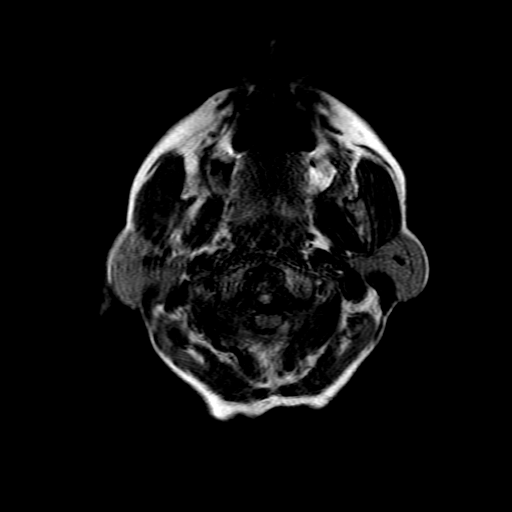
[im 25/25]
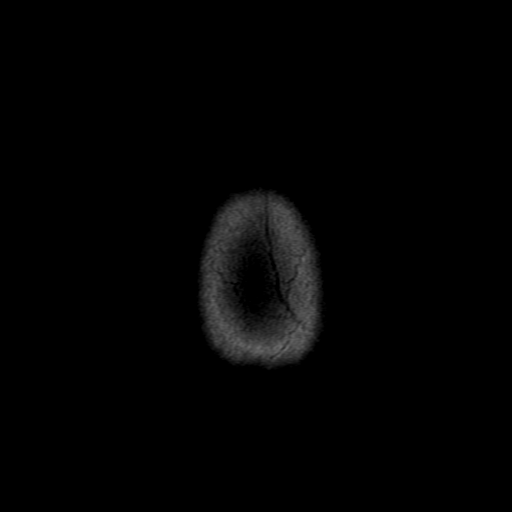

[Series 9: DWI · coronal · 5.0mm · 0.94mm/px · 6 of 68 slices shown (2 of 4)]
[im 1/68]
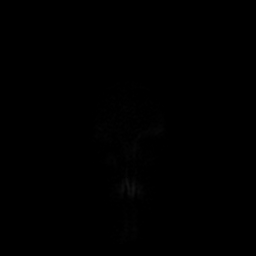
[im 14/68]
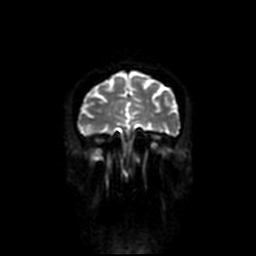
[im 27/68]
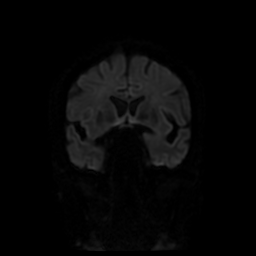
[im 41/68]
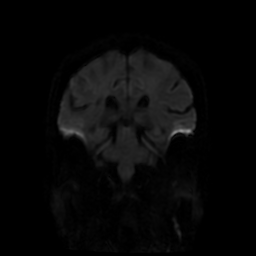
[im 54/68]
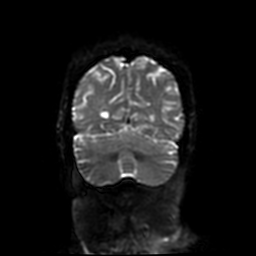
[im 68/68]
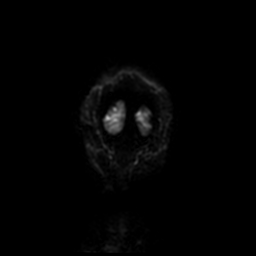

[Series 10: (person_name) · axial · 3.0mm · 0.47mm/px · z∈[-140,-78]mm · 4 of 100 slices shown]
[im 1/100]
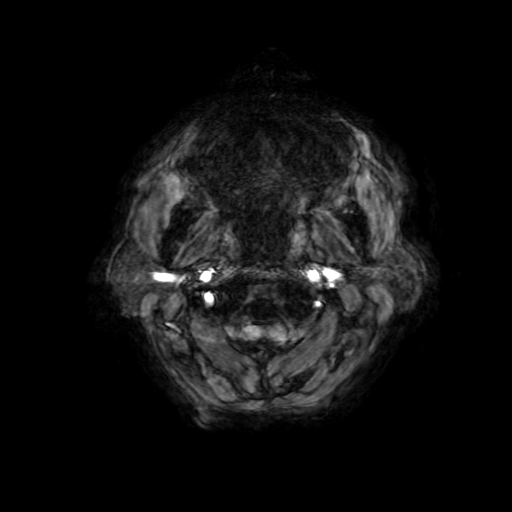
[im 15/100]
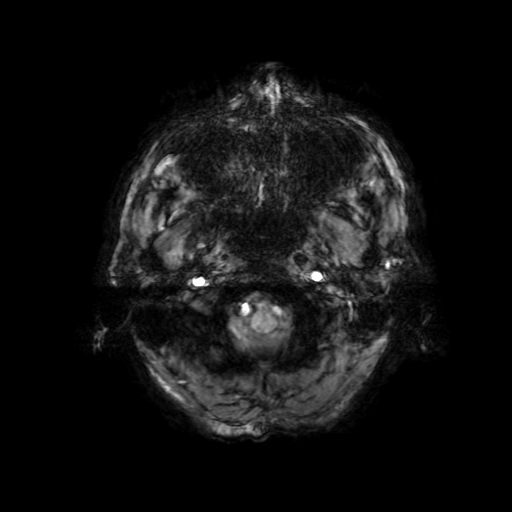
[im 29/100]
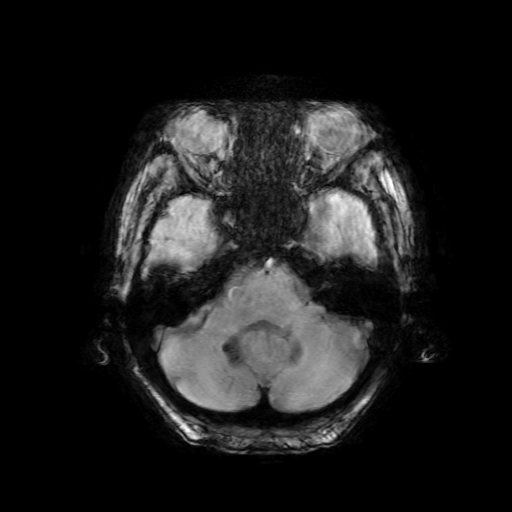
[im 43/100]
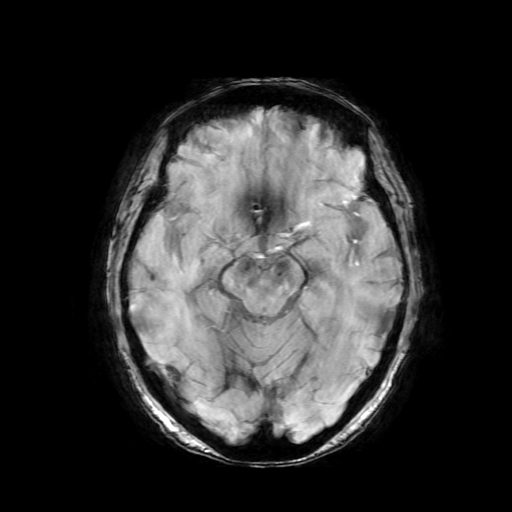

[Series 12: T2 post-contrast · coronal · 5.0mm · 0.39mm/px · 2 of 28 slices shown]
[im 1/28]
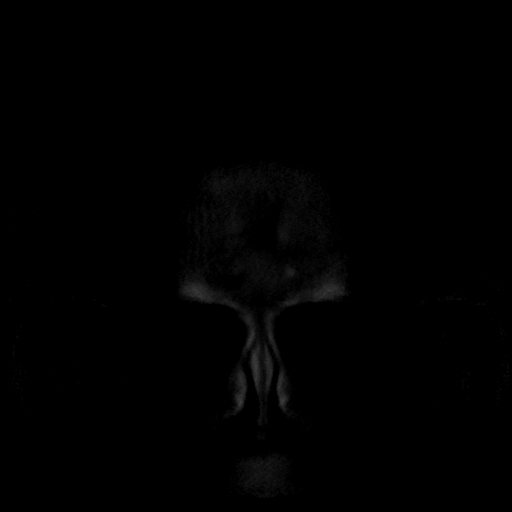
[im 28/28]
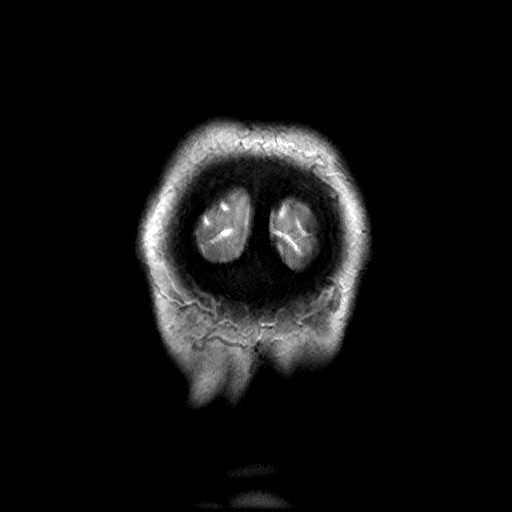

[Series 400: DWI · axial · 3.6mm · 0.94mm/px · z∈[-135,-3]mm · 3 of 39 slices shown (3 of 4)]
[im 1/39]
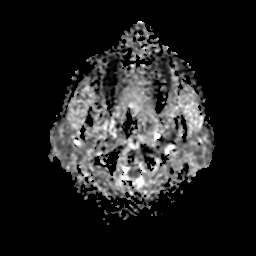
[im 20/39]
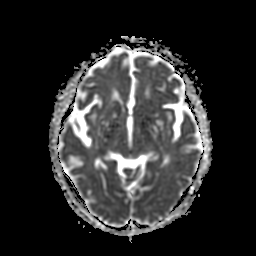
[im 39/39]
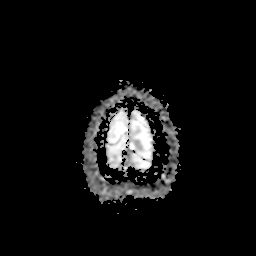

[Series 900: DWI · coronal · 5.0mm · 0.94mm/px · 3 of 34 slices shown (4 of 4)]
[im 1/34]
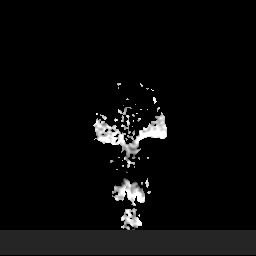
[im 17/34]
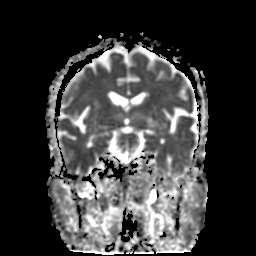
[im 34/34]
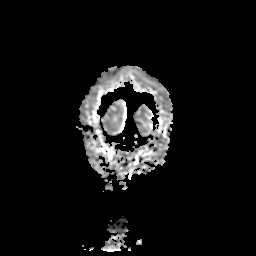

[32 of 48 positions shown; findings below may reference images not displayed]

FINDINGS: The patient was unable to remain motionless for the exam. Small or
subtle lesions could be overlooked.

No evidence for acute stroke, acute hemorrhage, mass lesion,
hydrocephalus, or extra-axial fluid.

Premature for age cerebral and cerebellar atrophy. Extensive T2 and
FLAIR hyperintensities throughout the periventricular and
subcortical white matter representing small vessel disease.
Scattered areas of chronic lacunar infarction affect the BILATERAL
thalami, BILATERAL basal ganglia, and periventricular white matter.

Flow voids are maintained. There are numerous foci of susceptibility
on gradient hemorrhage, throughout the cerebral hemispheres, deep
nuclei, cerebellum, and brainstem representing foci of
microhemorrhage. Constellation of microbleeds and white matter
disease is consistent with longstanding hypertensive cerebrovascular
disease.

Pituitary and cerebellar tonsils unremarkable. Previous cervical
fusion with instrumentation. Adjacent segment disease suspected at
C2-C3 with mild stenosis. Extracranial soft tissues show mild
chronic sinus disease but no acute findings.

The areas questioned on CT earlier today represent chronic lacunar
which have developed since the prior CT and MR 05/09/2015.
IMPRESSION: Atrophy, small vessel disease, and widespread areas of chronic
microhemorrhage consistent with longstanding hypertensive cerebral
vascular disease.

No acute intracranial findings. Specifically no acute RIGHT
hemisphere stroke.

## 2016-06-24 MED ORDER — MECLIZINE HCL 25 MG PO TABS: 25.0000 mg | ORAL_TABLET | Freq: Once | ORAL | Status: DC

## 2016-06-24 MED ORDER — MECLIZINE HCL 25 MG PO TABS: 25.0000 mg | ORAL_TABLET | Freq: Two times a day (BID) | ORAL | Status: DC | PRN

## 2016-06-24 MED ORDER — MECLIZINE HCL 25 MG PO TABS
25.0000 mg | ORAL_TABLET | Freq: Once | ORAL | Status: AC
  Administered 2016-06-24: 25 mg via ORAL
  Filled 2016-06-24: qty 1

## 2016-06-24 MED ORDER — ACETAMINOPHEN 325 MG PO TABS
650.0000 mg | ORAL_TABLET | Freq: Once | ORAL | Status: AC
  Administered 2016-06-24: 650 mg via ORAL
  Filled 2016-06-24: qty 2

## 2016-06-24 MED ORDER — DIAZEPAM 2 MG PO TABS
2.0000 mg | ORAL_TABLET | Freq: Once | ORAL | Status: AC
  Administered 2016-06-24: 2 mg via ORAL
  Filled 2016-06-24: qty 1

## 2016-06-24 NOTE — ED Notes (Addendum)
Pt arrives via EMS from home with c/o bloated/weakness/dizziness. Noted difficulty ambulating to stretcher.  MWF dialysis schedule

## 2016-06-24 NOTE — ED Notes (Signed)
Returned from MRI at this time.

## 2016-06-24 NOTE — ED Notes (Addendum)
Pt ambulated in hallway with unsteady gait. Pt states that she feels her "equilibrium is off".

## 2016-06-24 NOTE — Discharge Instructions (Signed)
Vertigo Vertigo means you feel like you or your surroundings are moving when they are not. Vertigo can be dangerous if it occurs when you are at work, driving, or performing difficult activities.  CAUSES  Vertigo occurs when there is a conflict of signals sent to your brain from the visual and sensory systems in your body. There are many different causes of vertigo, including:  Infections, especially in the inner ear.  A bad reaction to a drug or misuse of alcohol and medicines.  Withdrawal from drugs or alcohol.  Rapidly changing positions, such as lying down or rolling over in bed.  A migraine headache.  Decreased blood flow to the brain.  Increased pressure in the brain from a head injury, infection, tumor, or bleeding. SYMPTOMS  You may feel as though the world is spinning around or you are falling to the ground. Because your balance is upset, vertigo can cause nausea and vomiting. You may have involuntary eye movements (nystagmus). DIAGNOSIS  Vertigo is usually diagnosed by physical exam. If the cause of your vertigo is unknown, your caregiver may perform imaging tests, such as an MRI scan (magnetic resonance imaging). TREATMENT  Most cases of vertigo resolve on their own, without treatment. Depending on the cause, your caregiver may prescribe certain medicines. If your vertigo is related to body position issues, your caregiver may recommend movements or procedures to correct the problem. In rare cases, if your vertigo is caused by certain inner ear problems, you may need surgery. HOME CARE INSTRUCTIONS   Follow your caregiver's instructions.  Avoid driving.  Avoid operating heavy machinery.  Avoid performing any tasks that would be dangerous to you or others during a vertigo episode.  Tell your caregiver if you notice that certain medicines seem to be causing your vertigo. Some of the medicines used to treat vertigo episodes can actually make them worse in some people. SEEK  IMMEDIATE MEDICAL CARE IF:   Your medicines do not relieve your vertigo or are making it worse.  You develop problems with talking, walking, weakness, or using your arms, hands, or legs.  You develop severe headaches.  Your nausea or vomiting continues or gets worse.  You develop visual changes.  A family member notices behavioral changes.  Your condition gets worse. MAKE SURE YOU:  Understand these instructions.  Will watch your condition.  Will get help right away if you are not doing well or get worse.   This information is not intended to replace advice given to you by your health care provider. Make sure you discuss any questions you have with your health care provider.   Document Released: 09/09/2005 Document Revised: 02/22/2012 Document Reviewed: 03/25/2015 Elsevier Interactive Patient Education 2016 [employer]  

## 2016-06-24 NOTE — ED Provider Notes (Signed)
CSN: [id number]     Arrival date & time 06/24/16  0055 History   By signing my name below, I, Terrance Branch, attest that this documentation has been prepared under the direction and in the presence of Merryl Hacker, MD. Electronically Signed: Randa Evens, ED Scribe. 06/24/2016. 1:20 AM.     Chief Complaint  Patient presents with  . Fatigue  . Bloated   The history is provided by the patient. No language interpreter was used.    HPI Comments: [name] is a 65 y.o. female brought in by ambulance, who presents to the Emergency Department complaining of dizziness onset tonight about 4 hours PTA. Pt states that she has been feeling off balance. Pt also reports feeling nauseous and bloated. Reports HX of vertigo and that her symptoms feel similar to the past but states that the room is not spinning like previously. Denies weakness, numbness, vision changes or abdominal pain. Pt states that she is a dialysis pt and She last dialyzed on Monday.  Past Medical History  Diagnosis Date  . Depression with anxiety   . GERD (gastroesophageal reflux disease)   . Hyperlipidemia   . Hypertension   . Neuromuscular disorder (HCC)     neuropathy hand and legs  . Weight loss, unintentional   . Adenomatous polyp of colon 10/2010, 2006, 2015  . Diverticula, colon   . CHF (congestive heart failure) ([place])   . 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  Had deceased donor kidney transplant in 1996.  Had some early rejection then stable function for years, then had slow decline of function and went back on hemodialysis in 2012.  Gets HD TTS schedule at [facility] on [facility] still using L forearm AVF.     Marland Kitchen Anemia in CKD (chronic kidney disease) 11/07/2012    s/p blood transfusion.   . Arthritis   . Osteoporosis   . Stroke [facility]) 11/2015    TIA  . Pneumonia   . Headache   . Constipation    Past Surgical  History  Procedure Laterality Date  . Kidney transplant  1996  . Back surgery    . Cervical fusion    . Av fistula placement      for dialysis  . Cholecystectomy  12/02/2012    Procedure: LAPAROSCOPIC CHOLECYSTECTOMY WITH INTRAOPERATIVE CHOLANGIOGRAM;  Surgeon: Edward Jolly, MD;  Location: [facility];  Service: General;  Laterality: N/A;  . Eye surgery Bilateral     cataract surgery  . Av fistula placement Left 11/22/2015    Procedure: ARTERIOVENOUS (AV) FISTULA CREATION-LEFT BRACHIOCEPHALIC;  Surgeon: Serafina Mitchell, MD;  Location: [facility] OR;  Service: Vascular;  Laterality: Left;  . 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 Colon cancer Brother   . Cancer Brother   . Coronary artery disease Mother 15  . Hyperlipidemia Mother   . Hypertension Mother   . Esophageal cancer Neg Hx   . Stomach cancer Neg Hx   . Rectal cancer Neg Hx    Social History  Substance Use Topics  . Smoking status: Former Smoker    Types: Cigarettes    Quit date: 12/31/1991  . Smokeless tobacco:  Never Used  . Alcohol Use: No   OB History    No data available     Review of Systems  Eyes: Negative for visual disturbance.  Gastrointestinal: Positive for nausea. Negative for vomiting and abdominal pain.  Neurological: Positive for dizziness. Negative for weakness and numbness.  All other systems reviewed and are negative.     Allergies  Sulfa antibiotics  Home Medications   Prior to Admission medications   Medication Sig Start Date End Date Taking? Authorizing Provider  acetaminophen (TYLENOL) 325 MG tablet Take 650 mg by mouth every 6 (six) hours as needed for mild pain.   Yes Historical Provider, MD  acetaminophen-codeine (TYLENOL #3) 300-30 MG tablet Take 1-2 tablets by mouth  every 6 (six) hours as needed for moderate pain. 06/11/16  Yes Domenic Moras, PA-C  allopurinol (ZYLOPRIM) 300 MG tablet Take 150 mg by mouth at bedtime.   Yes Historical Provider, MD  ALPRAZolam (XANAX) 0.25 MG tablet Take 0.25 mg by mouth 2 (two) times daily as needed for anxiety.    Yes Historical Provider, MD  aspirin EC 325 MG tablet Take 325 mg by mouth at bedtime.   Yes Historical Provider, MD  B Complex-C-Folic Acid (DIALYVITE PO) Take 1 tablet by mouth daily.   Yes Historical Provider, MD  doxazosin (CARDURA) 8 MG tablet Take 1 tablet (8 mg total) by mouth 2 (two) times daily as needed (hypertension). Patient taking differently: Take 8 mg by mouth at bedtime.  11/17/15  Yes Modena Jansky, MD  labetalol (NORMODYNE) 300 MG tablet Take 300 mg by mouth 2 (two) times daily. 11/09/12  Yes Velvet Bathe, MD  lactulose (CHRONULAC) 10 GM/15ML solution Take 20 g by mouth daily as needed for mild constipation.   Yes Historical Provider, MD  losartan (COZAAR) 100 MG tablet Take 100 mg by mouth at bedtime.    Yes Historical Provider, MD  promethazine (PHENERGAN) 25 MG tablet Take 25 mg by mouth every 6 (six) hours as needed for nausea or vomiting.   Yes Historical Provider, MD  simvastatin (ZOCOR) 10 MG tablet Take 10 mg by mouth at bedtime.    Yes Historical Provider, MD  zolpidem (AMBIEN) 10 MG tablet Take 10 mg by mouth at bedtime.   Yes Historical Provider, MD  meclizine (ANTIVERT) 25 MG tablet Take 1 tablet (25 mg total) by mouth 2 (two) times daily as needed for dizziness. 06/24/16   Merryl Hacker, MD   BP 176/97 mmHg  Pulse 72  Temp(Src) 97.8 F (36.6 C) (Oral)  Resp 18  Ht 5\' 2"  (1.575 m)  SpO2 93%   Physical Exam  Constitutional: She is oriented to person, place, and time. No distress.  HENT:  Head: Normocephalic and atraumatic.  Nodule less than 0.5 cm noted on forehead  Eyes: EOM are normal. Pupils are equal, round, and reactive to light.  Cardiovascular: Normal rate, regular  rhythm and normal heart sounds.   No murmur heard. Pulmonary/Chest: Effort normal and breath sounds normal. No respiratory distress. She has no wheezes.  Abdominal: Soft. Bowel sounds are normal. There is no tenderness. There is no rebound.  Musculoskeletal:  Cranial nerves II through XII intact, 5 out of 5 strength in all 4 extremities, no dysmetria to finger-nose-finger, unsteady gait, no ataxia  fistula with positive thrill left upper extremity  Neurological: She is alert and oriented to person, place, and time.  Skin: Skin is warm and dry.  Psychiatric: She has a normal mood and affect.  Nursing note and vitals reviewed.   ED Course  Procedures (including critical care time) DIAGNOSTIC STUDIES: Oxygen Saturation is 100% on RA, normal by my interpretation.    COORDINATION OF CARE: 1:28 AM-Discussed treatment plan which includes Antivert  with pt at bedside and pt agreed to plan.     Labs Review Labs Reviewed  CBC WITH DIFFERENTIAL/PLATELET - Abnormal; Notable for the following:    RBC 3.61 (*)    Hemoglobin 10.7 (*)    HCT 33.2 (*)    RDW 18.3 (*)    All other components within normal limits  BASIC METABOLIC PANEL - Abnormal; Notable for the following:    Sodium 132 (*)    Chloride 92 (*)    BUN 33 (*)    Creatinine, Ser 6.05 (*)    GFR calc non Af Amer 7 (*)    GFR calc Af Amer 8 (*)    All other components within normal limits    Imaging Review Mr Brain Wo Contrast  06/24/2016  CLINICAL DATA:  Dizziness for 4 hours prior to arrival. Nausea and bloating. History of end-stage renal disease on dialysis, hypertension, hyperlipidemia, vestibular neuritis. EXAM: MRI HEAD WITHOUT CONTRAST TECHNIQUE: Multiplanar, multiecho pulse sequences of the brain and surrounding structures were obtained without intravenous contrast. COMPARISON:  CT HEAD June 11, 2016 and MRI of the brain November 16, 2015 FINDINGS: INTRACRANIAL CONTENTS: No reduced diffusion to suggest acute ischemia. No  susceptibility artifact to suggest hemorrhage. Scattered micro hemorrhages in the supra and infratentorial brain including bilateral thalamus, RIGHT pons were present previously. The ventricles and sulci are normal for patient's age. Patchy supratentorial and pontine white matter FLAIR T2 hyperintensities. Old bilateral thalamus and basal ganglia lacunar infarcts. No suspicious parenchymal signal, masses or mass effect. No abnormal extra-axial fluid collections. No extra-axial masses though, contrast enhanced sequences would be more sensitive. Normal major intracranial vascular flow voids present at skull base. Ectatic RIGHT basilar artery flow void unchanged. ORBITS: The included ocular globes and orbital contents are non-suspicious. Status post bilateral ocular lens implants. SINUSES: Atretic RIGHT maxillary sinus compatible chronic sinusitis. No paranasal sinus air-fluid levels. Trace LEFT mastoid effusion. SKULL/SOFT TISSUES: No abnormal sellar expansion. No suspicious calvarial bone marrow signal. Craniocervical junction maintained. ACDF partially imaged. IMPRESSION: No acute intracranial process, specifically no acute ischemia. Stable chronic changes including moderate chronic small vessel ischemic disease and old basal ganglia/thalamus lacunar infarcts. Similar scattered micro hemorrhages most compatible with chronic hypertension. Electronically Signed   By: Elon Alas M.D.   On: 06/24/2016 05:34      EKG Interpretation   Date/Time:  Wednesday June 24 2016 02:23:57 EDT Ventricular Rate:  71 PR Interval:    QRS Duration: 91 QT Interval:  466 QTC Calculation: 507 R Axis:   79 Text Interpretation:  Sinus rhythm Left ventricular hypertrophy Prolonged  QT interval Confirmed by Dina Rich  MD, COURTNEY ([id number]) on 06/24/2016 2:30:45  AM      MDM   Final diagnoses:  Vertigo   Patient presents with dizziness. Nontoxic appearing. Vital signs notable for blood pressure 178/90. Due for dialysis  later today. History of vertigo with somewhat similar symptoms. She is nonfocal on exam. She has an unsteady but not ataxic gait. She was given meclizine. EKG and basic labwork obtained and largely reassuring. Creatinine at baseline. Potassium reassuring. On recheck, she reports persistent symptoms despite treatment. Will obtain MRI to rule out stroke. She was subsequently given Valium.  MRI negative for acute stroke.  She was  able to ambulate independently without difficulty and reported improvement of her symptoms after Valium. Suspect vertigo. Will discharge home for dialysis later today which will likely help correct her blood pressure.  After history, exam, and medical workup I feel the patient has been appropriately medically screened and is safe for discharge home. Pertinent diagnoses were discussed with the patient. Patient was given return precautions.  I personally performed the services described in this documentation, which was scribed in my presence. The recorded information has been reviewed and is accurate.      Merryl Hacker, MD 06/25/16 [phone]

## 2016-06-26 DIAGNOSIS — N2581 Secondary hyperparathyroidism of renal origin: Secondary | ICD-10-CM | POA: Diagnosis not present

## 2016-06-26 DIAGNOSIS — D631 Anemia in chronic kidney disease: Secondary | ICD-10-CM | POA: Diagnosis not present

## 2016-06-26 DIAGNOSIS — N186 End stage renal disease: Secondary | ICD-10-CM | POA: Diagnosis not present

## 2016-06-29 DIAGNOSIS — N186 End stage renal disease: Secondary | ICD-10-CM | POA: Diagnosis not present

## 2016-06-29 DIAGNOSIS — N2581 Secondary hyperparathyroidism of renal origin: Secondary | ICD-10-CM | POA: Diagnosis not present

## 2016-06-29 DIAGNOSIS — D631 Anemia in chronic kidney disease: Secondary | ICD-10-CM | POA: Diagnosis not present

## 2016-07-01 DIAGNOSIS — N2581 Secondary hyperparathyroidism of renal origin: Secondary | ICD-10-CM | POA: Diagnosis not present

## 2016-07-01 DIAGNOSIS — D631 Anemia in chronic kidney disease: Secondary | ICD-10-CM | POA: Diagnosis not present

## 2016-07-01 DIAGNOSIS — N186 End stage renal disease: Secondary | ICD-10-CM | POA: Diagnosis not present

## 2016-07-03 DIAGNOSIS — N2581 Secondary hyperparathyroidism of renal origin: Secondary | ICD-10-CM | POA: Diagnosis not present

## 2016-07-03 DIAGNOSIS — N186 End stage renal disease: Secondary | ICD-10-CM | POA: Diagnosis not present

## 2016-07-03 DIAGNOSIS — D631 Anemia in chronic kidney disease: Secondary | ICD-10-CM | POA: Diagnosis not present

## 2016-07-06 DIAGNOSIS — N186 End stage renal disease: Secondary | ICD-10-CM | POA: Diagnosis not present

## 2016-07-06 DIAGNOSIS — D631 Anemia in chronic kidney disease: Secondary | ICD-10-CM | POA: Diagnosis not present

## 2016-07-06 DIAGNOSIS — N2581 Secondary hyperparathyroidism of renal origin: Secondary | ICD-10-CM | POA: Diagnosis not present

## 2016-07-07 DIAGNOSIS — M20012 Mallet finger of left finger(s): Secondary | ICD-10-CM | POA: Diagnosis not present

## 2016-07-08 DIAGNOSIS — D631 Anemia in chronic kidney disease: Secondary | ICD-10-CM | POA: Diagnosis not present

## 2016-07-08 DIAGNOSIS — N2581 Secondary hyperparathyroidism of renal origin: Secondary | ICD-10-CM | POA: Diagnosis not present

## 2016-07-08 DIAGNOSIS — N186 End stage renal disease: Secondary | ICD-10-CM | POA: Diagnosis not present

## 2016-07-10 DIAGNOSIS — D631 Anemia in chronic kidney disease: Secondary | ICD-10-CM | POA: Diagnosis not present

## 2016-07-10 DIAGNOSIS — N2581 Secondary hyperparathyroidism of renal origin: Secondary | ICD-10-CM | POA: Diagnosis not present

## 2016-07-10 DIAGNOSIS — N186 End stage renal disease: Secondary | ICD-10-CM | POA: Diagnosis not present

## 2016-07-13 DIAGNOSIS — N2581 Secondary hyperparathyroidism of renal origin: Secondary | ICD-10-CM | POA: Diagnosis not present

## 2016-07-13 DIAGNOSIS — N186 End stage renal disease: Secondary | ICD-10-CM | POA: Diagnosis not present

## 2016-07-13 DIAGNOSIS — Z992 Dependence on renal dialysis: Secondary | ICD-10-CM | POA: Diagnosis not present

## 2016-07-13 DIAGNOSIS — T8612 Kidney transplant failure: Secondary | ICD-10-CM | POA: Diagnosis not present

## 2016-07-13 DIAGNOSIS — D631 Anemia in chronic kidney disease: Secondary | ICD-10-CM | POA: Diagnosis not present

## 2016-07-15 DIAGNOSIS — D631 Anemia in chronic kidney disease: Secondary | ICD-10-CM | POA: Diagnosis not present

## 2016-07-15 DIAGNOSIS — N186 End stage renal disease: Secondary | ICD-10-CM | POA: Diagnosis not present

## 2016-07-15 DIAGNOSIS — N2581 Secondary hyperparathyroidism of renal origin: Secondary | ICD-10-CM | POA: Diagnosis not present

## 2016-07-17 DIAGNOSIS — N186 End stage renal disease: Secondary | ICD-10-CM | POA: Diagnosis not present

## 2016-07-17 DIAGNOSIS — D631 Anemia in chronic kidney disease: Secondary | ICD-10-CM | POA: Diagnosis not present

## 2016-07-17 DIAGNOSIS — N2581 Secondary hyperparathyroidism of renal origin: Secondary | ICD-10-CM | POA: Diagnosis not present

## 2016-07-18 IMAGING — CR DG CHEST 2V
2 series · 2 of 2 positions shown · non-contrast
Comparison: November 16, 2015.

CLINICAL DATA: Right-sided weakness.

EXAM:
CHEST  2 VIEW

[chest pa]
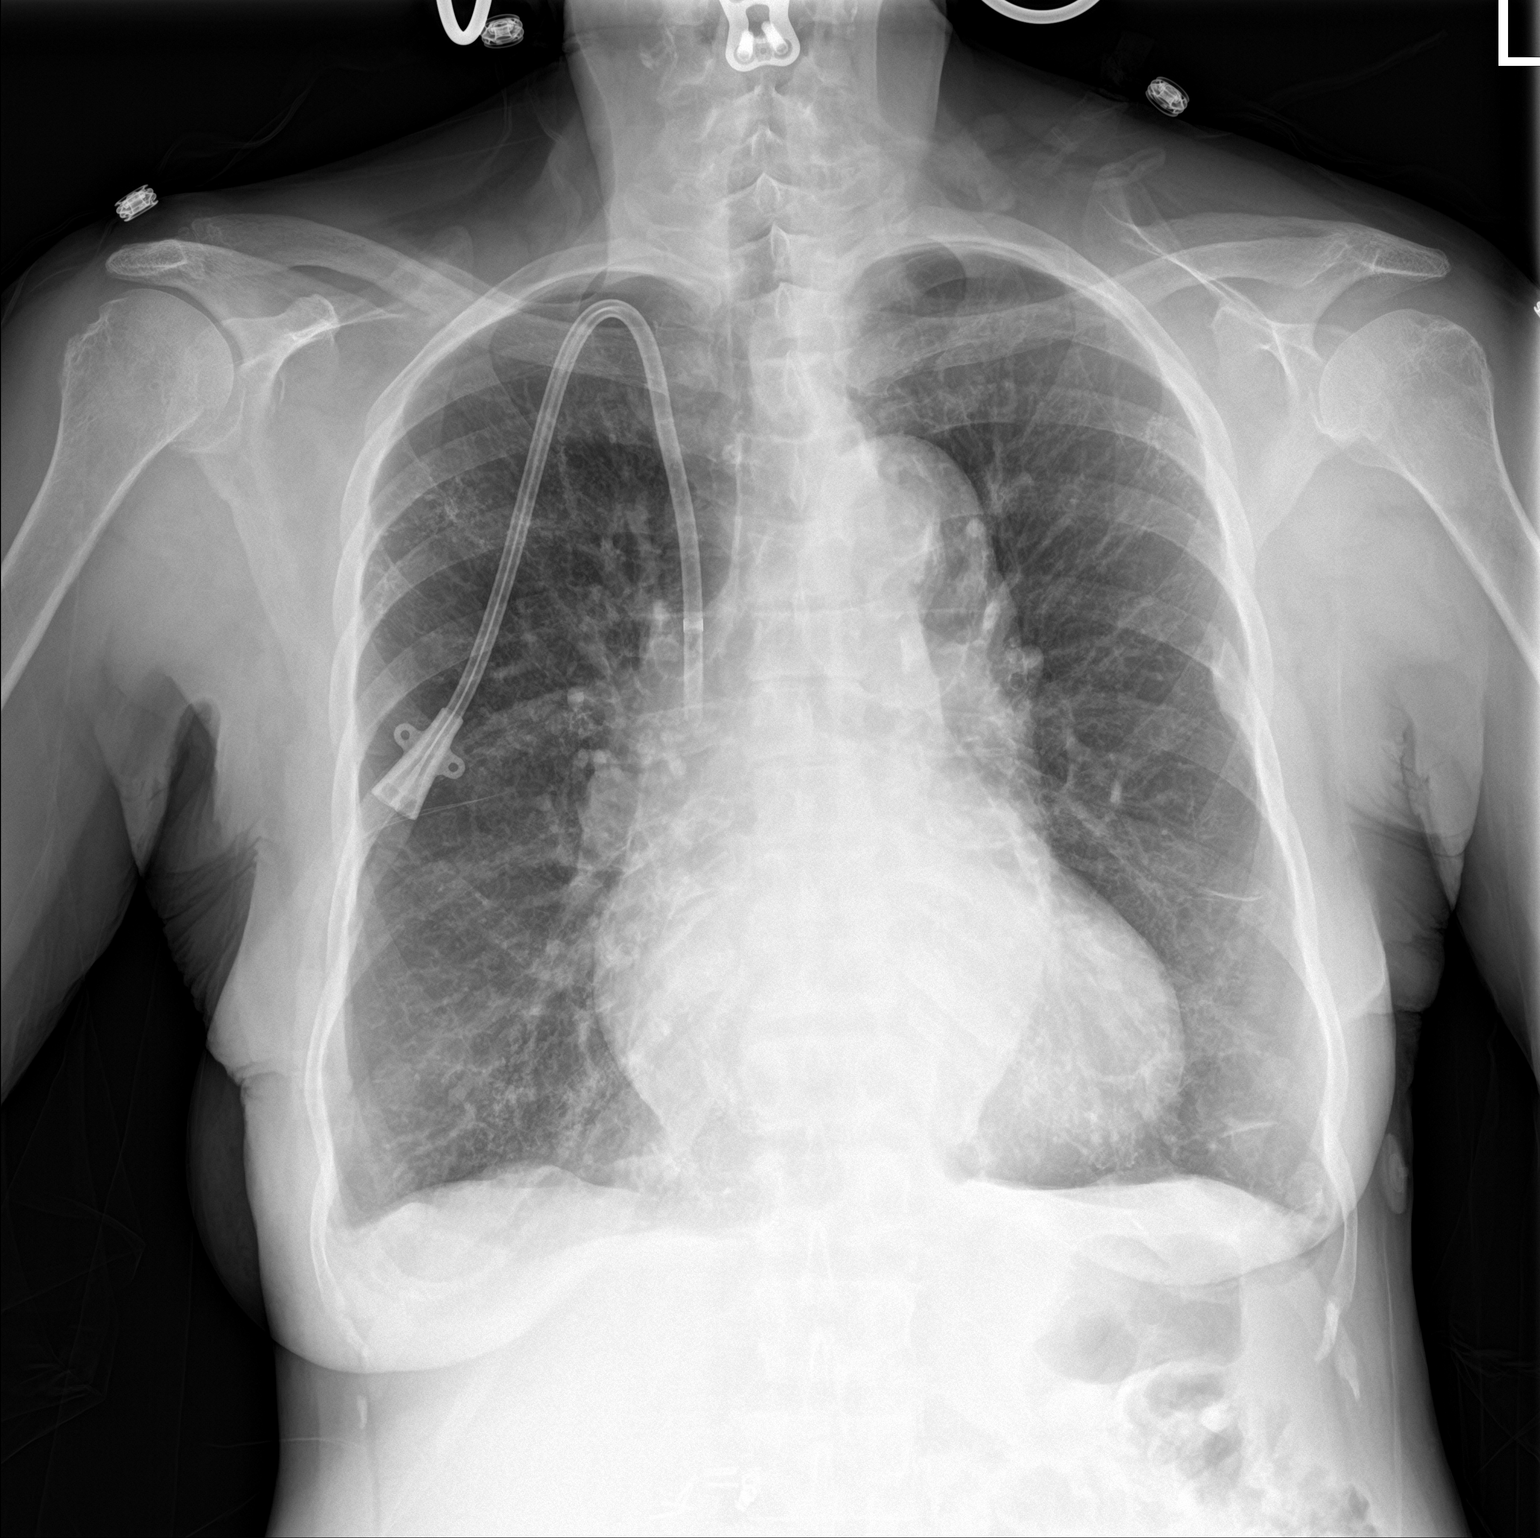

[chest lat]
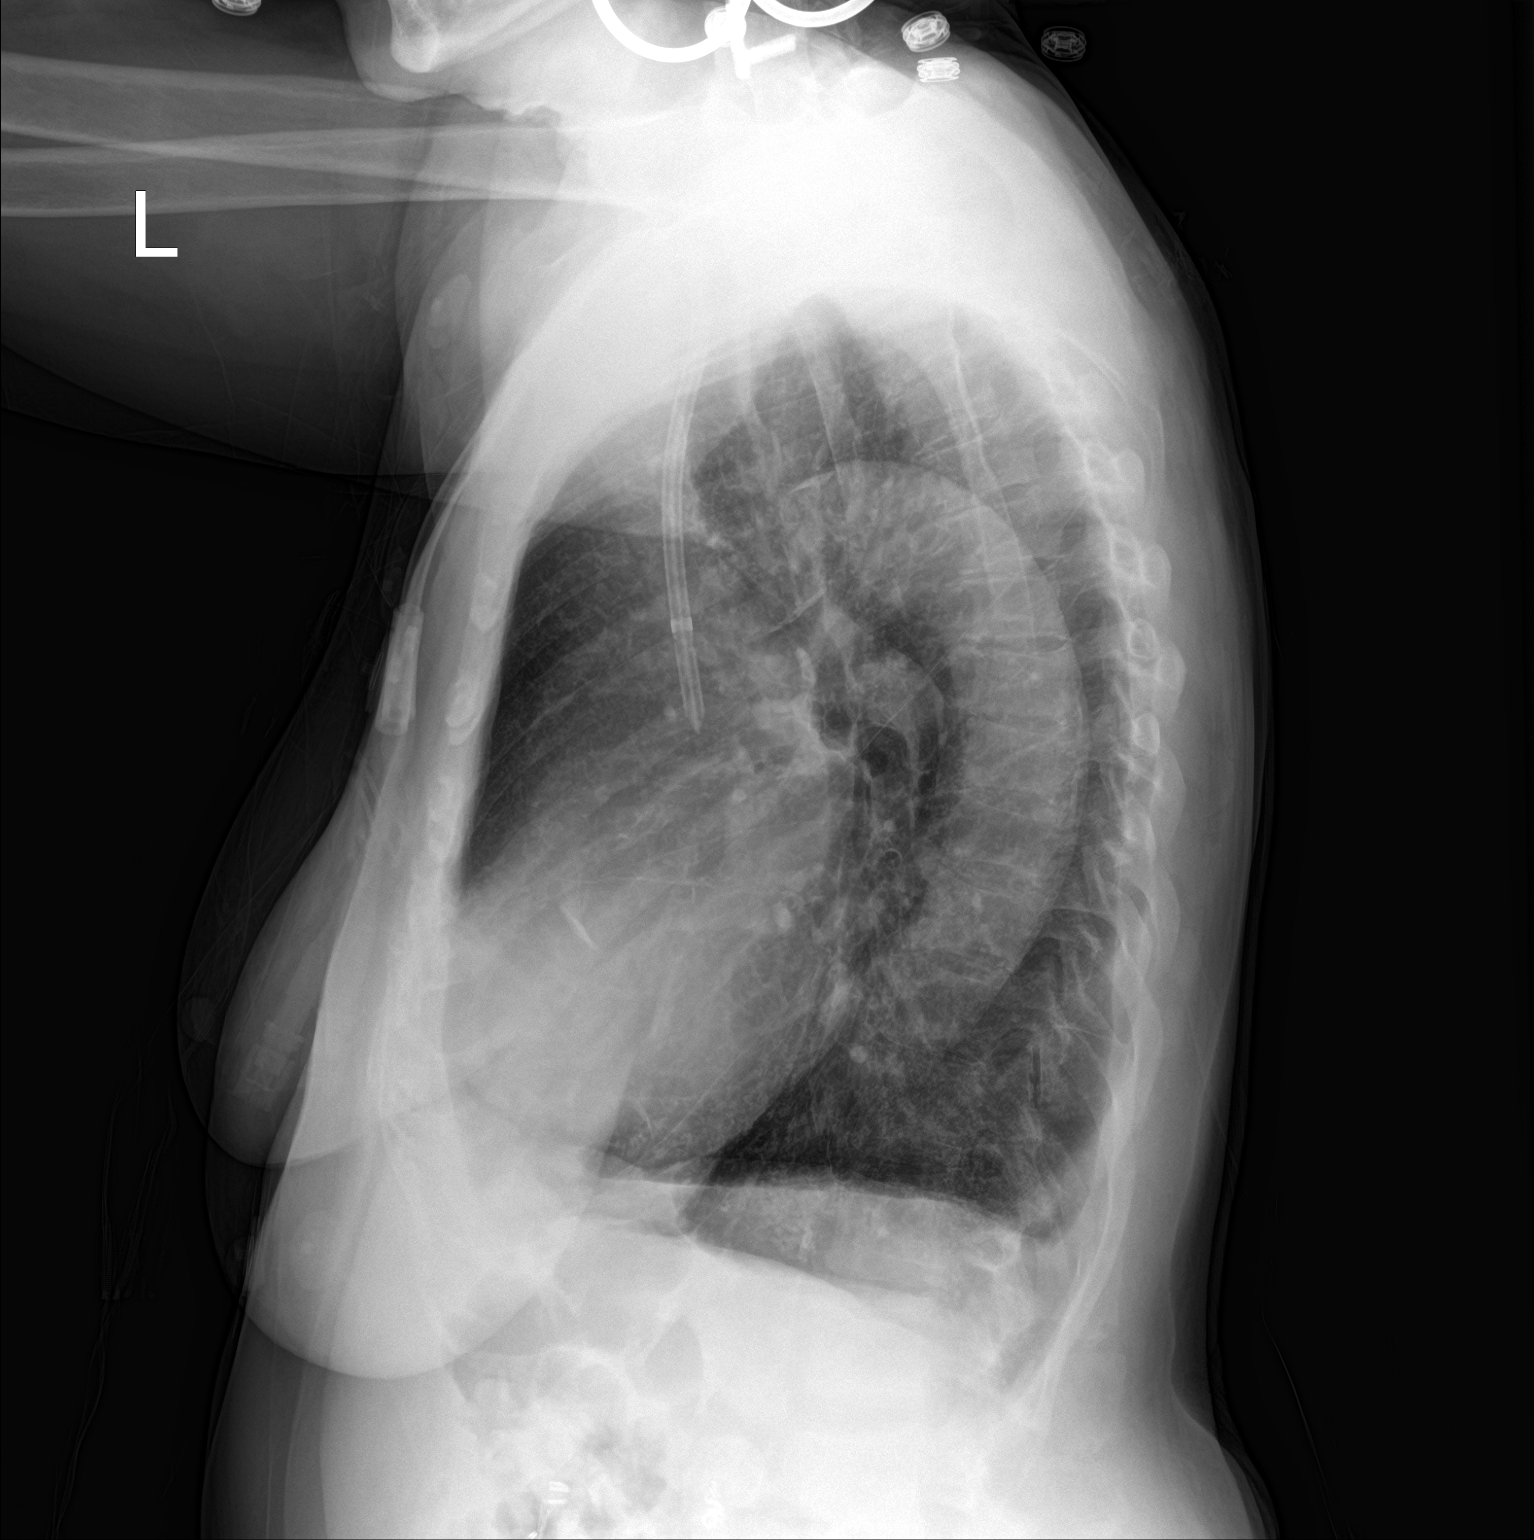

[2 of 2 positions shown; findings below may reference images not displayed]

FINDINGS: Stable cardiomegaly. No pneumothorax or pleural effusion is noted.
Stable irregular densities are noted in right upper lobe. Right
internal jugular catheter is unchanged with distal tip in expected
position of the SVC. No pneumothorax or pleural effusion is noted.
Old left rib fracture is noted. Stable bibasilar scarring is noted.
IMPRESSION: No acute cardiopulmonary abnormality seen.

## 2016-07-20 DIAGNOSIS — D631 Anemia in chronic kidney disease: Secondary | ICD-10-CM | POA: Diagnosis not present

## 2016-07-20 DIAGNOSIS — N2581 Secondary hyperparathyroidism of renal origin: Secondary | ICD-10-CM | POA: Diagnosis not present

## 2016-07-20 DIAGNOSIS — N186 End stage renal disease: Secondary | ICD-10-CM | POA: Diagnosis not present

## 2016-07-21 DIAGNOSIS — N63 Unspecified lump in breast: Secondary | ICD-10-CM | POA: Diagnosis not present

## 2016-07-22 DIAGNOSIS — D631 Anemia in chronic kidney disease: Secondary | ICD-10-CM | POA: Diagnosis not present

## 2016-07-22 DIAGNOSIS — N186 End stage renal disease: Secondary | ICD-10-CM | POA: Diagnosis not present

## 2016-07-22 DIAGNOSIS — N2581 Secondary hyperparathyroidism of renal origin: Secondary | ICD-10-CM | POA: Diagnosis not present

## 2016-07-23 DIAGNOSIS — M4316 Spondylolisthesis, lumbar region: Secondary | ICD-10-CM | POA: Diagnosis not present

## 2016-07-24 DIAGNOSIS — N186 End stage renal disease: Secondary | ICD-10-CM | POA: Diagnosis not present

## 2016-07-24 DIAGNOSIS — N2581 Secondary hyperparathyroidism of renal origin: Secondary | ICD-10-CM | POA: Diagnosis not present

## 2016-07-24 DIAGNOSIS — D631 Anemia in chronic kidney disease: Secondary | ICD-10-CM | POA: Diagnosis not present

## 2016-07-25 DIAGNOSIS — M4802 Spinal stenosis, cervical region: Secondary | ICD-10-CM | POA: Diagnosis not present

## 2016-07-25 DIAGNOSIS — M4316 Spondylolisthesis, lumbar region: Secondary | ICD-10-CM | POA: Diagnosis not present

## 2016-07-27 DIAGNOSIS — N2581 Secondary hyperparathyroidism of renal origin: Secondary | ICD-10-CM | POA: Diagnosis not present

## 2016-07-27 DIAGNOSIS — N186 End stage renal disease: Secondary | ICD-10-CM | POA: Diagnosis not present

## 2016-07-27 DIAGNOSIS — D631 Anemia in chronic kidney disease: Secondary | ICD-10-CM | POA: Diagnosis not present

## 2016-07-29 DIAGNOSIS — I10 Essential (primary) hypertension: Secondary | ICD-10-CM | POA: Diagnosis not present

## 2016-07-29 DIAGNOSIS — M4316 Spondylolisthesis, lumbar region: Secondary | ICD-10-CM | POA: Diagnosis not present

## 2016-07-29 DIAGNOSIS — N186 End stage renal disease: Secondary | ICD-10-CM | POA: Diagnosis not present

## 2016-07-29 DIAGNOSIS — D631 Anemia in chronic kidney disease: Secondary | ICD-10-CM | POA: Diagnosis not present

## 2016-07-29 DIAGNOSIS — N2581 Secondary hyperparathyroidism of renal origin: Secondary | ICD-10-CM | POA: Diagnosis not present

## 2016-07-30 DIAGNOSIS — S60423D Blister (nonthermal) of left middle finger, subsequent encounter: Secondary | ICD-10-CM | POA: Diagnosis not present

## 2016-07-30 DIAGNOSIS — M20012 Mallet finger of left finger(s): Secondary | ICD-10-CM | POA: Diagnosis not present

## 2016-07-30 DIAGNOSIS — S60423A Blister (nonthermal) of left middle finger, initial encounter: Secondary | ICD-10-CM | POA: Diagnosis not present

## 2016-07-31 DIAGNOSIS — N186 End stage renal disease: Secondary | ICD-10-CM | POA: Diagnosis not present

## 2016-07-31 DIAGNOSIS — D631 Anemia in chronic kidney disease: Secondary | ICD-10-CM | POA: Diagnosis not present

## 2016-07-31 DIAGNOSIS — N2581 Secondary hyperparathyroidism of renal origin: Secondary | ICD-10-CM | POA: Diagnosis not present

## 2016-08-03 DIAGNOSIS — N2581 Secondary hyperparathyroidism of renal origin: Secondary | ICD-10-CM | POA: Diagnosis not present

## 2016-08-03 DIAGNOSIS — D631 Anemia in chronic kidney disease: Secondary | ICD-10-CM | POA: Diagnosis not present

## 2016-08-03 DIAGNOSIS — N186 End stage renal disease: Secondary | ICD-10-CM | POA: Diagnosis not present

## 2016-08-05 DIAGNOSIS — D631 Anemia in chronic kidney disease: Secondary | ICD-10-CM | POA: Diagnosis not present

## 2016-08-05 DIAGNOSIS — N186 End stage renal disease: Secondary | ICD-10-CM | POA: Diagnosis not present

## 2016-08-05 DIAGNOSIS — N2581 Secondary hyperparathyroidism of renal origin: Secondary | ICD-10-CM | POA: Diagnosis not present

## 2016-08-07 DIAGNOSIS — D631 Anemia in chronic kidney disease: Secondary | ICD-10-CM | POA: Diagnosis not present

## 2016-08-07 DIAGNOSIS — N186 End stage renal disease: Secondary | ICD-10-CM | POA: Diagnosis not present

## 2016-08-07 DIAGNOSIS — N2581 Secondary hyperparathyroidism of renal origin: Secondary | ICD-10-CM | POA: Diagnosis not present

## 2016-08-10 DIAGNOSIS — D631 Anemia in chronic kidney disease: Secondary | ICD-10-CM | POA: Diagnosis not present

## 2016-08-10 DIAGNOSIS — N186 End stage renal disease: Secondary | ICD-10-CM | POA: Diagnosis not present

## 2016-08-10 DIAGNOSIS — N2581 Secondary hyperparathyroidism of renal origin: Secondary | ICD-10-CM | POA: Diagnosis not present

## 2016-08-12 DIAGNOSIS — N186 End stage renal disease: Secondary | ICD-10-CM | POA: Diagnosis not present

## 2016-08-12 DIAGNOSIS — N2581 Secondary hyperparathyroidism of renal origin: Secondary | ICD-10-CM | POA: Diagnosis not present

## 2016-08-12 DIAGNOSIS — D631 Anemia in chronic kidney disease: Secondary | ICD-10-CM | POA: Diagnosis not present

## 2016-08-13 DIAGNOSIS — Z992 Dependence on renal dialysis: Secondary | ICD-10-CM | POA: Diagnosis not present

## 2016-08-13 DIAGNOSIS — I10 Essential (primary) hypertension: Secondary | ICD-10-CM | POA: Diagnosis not present

## 2016-08-13 DIAGNOSIS — T8612 Kidney transplant failure: Secondary | ICD-10-CM | POA: Diagnosis not present

## 2016-08-13 DIAGNOSIS — R413 Other amnesia: Secondary | ICD-10-CM | POA: Diagnosis not present

## 2016-08-13 DIAGNOSIS — N186 End stage renal disease: Secondary | ICD-10-CM | POA: Diagnosis not present

## 2016-08-13 DIAGNOSIS — E7800 Pure hypercholesterolemia, unspecified: Secondary | ICD-10-CM | POA: Diagnosis not present

## 2016-08-14 DIAGNOSIS — D509 Iron deficiency anemia, unspecified: Secondary | ICD-10-CM | POA: Diagnosis not present

## 2016-08-14 DIAGNOSIS — Z23 Encounter for immunization: Secondary | ICD-10-CM | POA: Diagnosis not present

## 2016-08-14 DIAGNOSIS — N186 End stage renal disease: Secondary | ICD-10-CM | POA: Diagnosis not present

## 2016-08-14 DIAGNOSIS — N2581 Secondary hyperparathyroidism of renal origin: Secondary | ICD-10-CM | POA: Diagnosis not present

## 2016-08-14 DIAGNOSIS — D631 Anemia in chronic kidney disease: Secondary | ICD-10-CM | POA: Diagnosis not present

## 2016-08-17 DIAGNOSIS — N2581 Secondary hyperparathyroidism of renal origin: Secondary | ICD-10-CM | POA: Diagnosis not present

## 2016-08-17 DIAGNOSIS — N186 End stage renal disease: Secondary | ICD-10-CM | POA: Diagnosis not present

## 2016-08-17 DIAGNOSIS — D631 Anemia in chronic kidney disease: Secondary | ICD-10-CM | POA: Diagnosis not present

## 2016-08-17 DIAGNOSIS — Z23 Encounter for immunization: Secondary | ICD-10-CM | POA: Diagnosis not present

## 2016-08-17 DIAGNOSIS — D509 Iron deficiency anemia, unspecified: Secondary | ICD-10-CM | POA: Diagnosis not present

## 2016-08-19 DIAGNOSIS — Z23 Encounter for immunization: Secondary | ICD-10-CM | POA: Diagnosis not present

## 2016-08-19 DIAGNOSIS — D509 Iron deficiency anemia, unspecified: Secondary | ICD-10-CM | POA: Diagnosis not present

## 2016-08-19 DIAGNOSIS — D631 Anemia in chronic kidney disease: Secondary | ICD-10-CM | POA: Diagnosis not present

## 2016-08-19 DIAGNOSIS — N2581 Secondary hyperparathyroidism of renal origin: Secondary | ICD-10-CM | POA: Diagnosis not present

## 2016-08-19 DIAGNOSIS — N186 End stage renal disease: Secondary | ICD-10-CM | POA: Diagnosis not present

## 2016-08-21 DIAGNOSIS — D509 Iron deficiency anemia, unspecified: Secondary | ICD-10-CM | POA: Diagnosis not present

## 2016-08-21 DIAGNOSIS — N2581 Secondary hyperparathyroidism of renal origin: Secondary | ICD-10-CM | POA: Diagnosis not present

## 2016-08-21 DIAGNOSIS — D631 Anemia in chronic kidney disease: Secondary | ICD-10-CM | POA: Diagnosis not present

## 2016-08-21 DIAGNOSIS — Z23 Encounter for immunization: Secondary | ICD-10-CM | POA: Diagnosis not present

## 2016-08-21 DIAGNOSIS — N186 End stage renal disease: Secondary | ICD-10-CM | POA: Diagnosis not present

## 2016-08-24 DIAGNOSIS — N2581 Secondary hyperparathyroidism of renal origin: Secondary | ICD-10-CM | POA: Diagnosis not present

## 2016-08-24 DIAGNOSIS — Z23 Encounter for immunization: Secondary | ICD-10-CM | POA: Diagnosis not present

## 2016-08-24 DIAGNOSIS — D631 Anemia in chronic kidney disease: Secondary | ICD-10-CM | POA: Diagnosis not present

## 2016-08-24 DIAGNOSIS — D509 Iron deficiency anemia, unspecified: Secondary | ICD-10-CM | POA: Diagnosis not present

## 2016-08-24 DIAGNOSIS — N186 End stage renal disease: Secondary | ICD-10-CM | POA: Diagnosis not present

## 2016-08-26 DIAGNOSIS — D631 Anemia in chronic kidney disease: Secondary | ICD-10-CM | POA: Diagnosis not present

## 2016-08-26 DIAGNOSIS — Z23 Encounter for immunization: Secondary | ICD-10-CM | POA: Diagnosis not present

## 2016-08-26 DIAGNOSIS — D509 Iron deficiency anemia, unspecified: Secondary | ICD-10-CM | POA: Diagnosis not present

## 2016-08-26 DIAGNOSIS — N2581 Secondary hyperparathyroidism of renal origin: Secondary | ICD-10-CM | POA: Diagnosis not present

## 2016-08-26 DIAGNOSIS — N186 End stage renal disease: Secondary | ICD-10-CM | POA: Diagnosis not present

## 2016-08-28 DIAGNOSIS — N2581 Secondary hyperparathyroidism of renal origin: Secondary | ICD-10-CM | POA: Diagnosis not present

## 2016-08-28 DIAGNOSIS — D631 Anemia in chronic kidney disease: Secondary | ICD-10-CM | POA: Diagnosis not present

## 2016-08-28 DIAGNOSIS — D509 Iron deficiency anemia, unspecified: Secondary | ICD-10-CM | POA: Diagnosis not present

## 2016-08-28 DIAGNOSIS — N186 End stage renal disease: Secondary | ICD-10-CM | POA: Diagnosis not present

## 2016-08-28 DIAGNOSIS — Z23 Encounter for immunization: Secondary | ICD-10-CM | POA: Diagnosis not present

## 2016-08-31 DIAGNOSIS — N186 End stage renal disease: Secondary | ICD-10-CM | POA: Diagnosis not present

## 2016-08-31 DIAGNOSIS — Z23 Encounter for immunization: Secondary | ICD-10-CM | POA: Diagnosis not present

## 2016-08-31 DIAGNOSIS — N2581 Secondary hyperparathyroidism of renal origin: Secondary | ICD-10-CM | POA: Diagnosis not present

## 2016-08-31 DIAGNOSIS — D509 Iron deficiency anemia, unspecified: Secondary | ICD-10-CM | POA: Diagnosis not present

## 2016-08-31 DIAGNOSIS — D631 Anemia in chronic kidney disease: Secondary | ICD-10-CM | POA: Diagnosis not present

## 2016-09-02 DIAGNOSIS — Z23 Encounter for immunization: Secondary | ICD-10-CM | POA: Diagnosis not present

## 2016-09-02 DIAGNOSIS — N186 End stage renal disease: Secondary | ICD-10-CM | POA: Diagnosis not present

## 2016-09-02 DIAGNOSIS — D509 Iron deficiency anemia, unspecified: Secondary | ICD-10-CM | POA: Diagnosis not present

## 2016-09-02 DIAGNOSIS — D631 Anemia in chronic kidney disease: Secondary | ICD-10-CM | POA: Diagnosis not present

## 2016-09-02 DIAGNOSIS — N2581 Secondary hyperparathyroidism of renal origin: Secondary | ICD-10-CM | POA: Diagnosis not present

## 2016-09-04 DIAGNOSIS — D509 Iron deficiency anemia, unspecified: Secondary | ICD-10-CM | POA: Diagnosis not present

## 2016-09-04 DIAGNOSIS — D631 Anemia in chronic kidney disease: Secondary | ICD-10-CM | POA: Diagnosis not present

## 2016-09-04 DIAGNOSIS — N2581 Secondary hyperparathyroidism of renal origin: Secondary | ICD-10-CM | POA: Diagnosis not present

## 2016-09-04 DIAGNOSIS — Z23 Encounter for immunization: Secondary | ICD-10-CM | POA: Diagnosis not present

## 2016-09-04 DIAGNOSIS — N186 End stage renal disease: Secondary | ICD-10-CM | POA: Diagnosis not present

## 2016-09-07 DIAGNOSIS — N2581 Secondary hyperparathyroidism of renal origin: Secondary | ICD-10-CM | POA: Diagnosis not present

## 2016-09-07 DIAGNOSIS — N186 End stage renal disease: Secondary | ICD-10-CM | POA: Diagnosis not present

## 2016-09-07 DIAGNOSIS — D631 Anemia in chronic kidney disease: Secondary | ICD-10-CM | POA: Diagnosis not present

## 2016-09-07 DIAGNOSIS — D509 Iron deficiency anemia, unspecified: Secondary | ICD-10-CM | POA: Diagnosis not present

## 2016-09-07 DIAGNOSIS — Z23 Encounter for immunization: Secondary | ICD-10-CM | POA: Diagnosis not present

## 2016-09-09 DIAGNOSIS — N186 End stage renal disease: Secondary | ICD-10-CM | POA: Diagnosis not present

## 2016-09-09 DIAGNOSIS — Z23 Encounter for immunization: Secondary | ICD-10-CM | POA: Diagnosis not present

## 2016-09-09 DIAGNOSIS — D509 Iron deficiency anemia, unspecified: Secondary | ICD-10-CM | POA: Diagnosis not present

## 2016-09-09 DIAGNOSIS — D631 Anemia in chronic kidney disease: Secondary | ICD-10-CM | POA: Diagnosis not present

## 2016-09-09 DIAGNOSIS — N2581 Secondary hyperparathyroidism of renal origin: Secondary | ICD-10-CM | POA: Diagnosis not present

## 2016-09-11 DIAGNOSIS — Z23 Encounter for immunization: Secondary | ICD-10-CM | POA: Diagnosis not present

## 2016-09-11 DIAGNOSIS — D631 Anemia in chronic kidney disease: Secondary | ICD-10-CM | POA: Diagnosis not present

## 2016-09-11 DIAGNOSIS — N2581 Secondary hyperparathyroidism of renal origin: Secondary | ICD-10-CM | POA: Diagnosis not present

## 2016-09-11 DIAGNOSIS — D509 Iron deficiency anemia, unspecified: Secondary | ICD-10-CM | POA: Diagnosis not present

## 2016-09-11 DIAGNOSIS — N186 End stage renal disease: Secondary | ICD-10-CM | POA: Diagnosis not present

## 2016-09-12 ENCOUNTER — Emergency Department (HOSPITAL_COMMUNITY)
Admission: EM | Admit: 2016-09-12 | Discharge: 2016-09-12 | Disposition: A | Discharge status: Home / Self Care | Payer: Medicare Other | Attending: Dermatology | Admitting: Dermatology

## 2016-09-12 ENCOUNTER — Encounter (HOSPITAL_COMMUNITY): Payer: Self-pay | Admitting: Emergency Medicine

## 2016-09-12 DIAGNOSIS — Z5321 Procedure and treatment not carried out due to patient leaving prior to being seen by health care provider: Secondary | ICD-10-CM | POA: Diagnosis not present

## 2016-09-12 DIAGNOSIS — S0191XA Laceration without foreign body of unspecified part of head, initial encounter: Secondary | ICD-10-CM | POA: Diagnosis not present

## 2016-09-12 DIAGNOSIS — Z87891 Personal history of nicotine dependence: Secondary | ICD-10-CM | POA: Insufficient documentation

## 2016-09-12 DIAGNOSIS — Z79899 Other long term (current) drug therapy: Secondary | ICD-10-CM | POA: Diagnosis not present

## 2016-09-12 DIAGNOSIS — Y929 Unspecified place or not applicable: Secondary | ICD-10-CM | POA: Insufficient documentation

## 2016-09-12 DIAGNOSIS — I132 Hypertensive heart and chronic kidney disease with heart failure and with stage 5 chronic kidney disease, or end stage renal disease: Secondary | ICD-10-CM | POA: Insufficient documentation

## 2016-09-12 DIAGNOSIS — Z7982 Long term (current) use of aspirin: Secondary | ICD-10-CM | POA: Diagnosis not present

## 2016-09-12 DIAGNOSIS — W228XXA Striking against or struck by other objects, initial encounter: Secondary | ICD-10-CM | POA: Diagnosis not present

## 2016-09-12 DIAGNOSIS — Z8673 Personal history of transient ischemic attack (TIA), and cerebral infarction without residual deficits: Secondary | ICD-10-CM | POA: Diagnosis not present

## 2016-09-12 DIAGNOSIS — Y939 Activity, unspecified: Secondary | ICD-10-CM | POA: Insufficient documentation

## 2016-09-12 DIAGNOSIS — N186 End stage renal disease: Secondary | ICD-10-CM | POA: Insufficient documentation

## 2016-09-12 DIAGNOSIS — Y999 Unspecified external cause status: Secondary | ICD-10-CM | POA: Diagnosis not present

## 2016-09-12 DIAGNOSIS — I509 Heart failure, unspecified: Secondary | ICD-10-CM | POA: Insufficient documentation

## 2016-09-12 DIAGNOSIS — Z992 Dependence on renal dialysis: Secondary | ICD-10-CM | POA: Diagnosis not present

## 2016-09-12 DIAGNOSIS — T8612 Kidney transplant failure: Secondary | ICD-10-CM | POA: Diagnosis not present

## 2016-09-12 NOTE — ED Triage Notes (Signed)
Pt states she fell and hit back of head on her bed while getting up out of bed approx 1 hour ago. Denies LOC.  Denies neck and back pain.  C/o laceration to back of head.  Bleeding controlled.  Pt states she was groggy from taking a sleeping pill.

## 2016-09-12 NOTE — ED Notes (Signed)
Pt insisting she wants to leave, unwilling to wait. Attempted to get patient to stay or call a ride, pt refused. States "I do it all the time" referring to driving under the influence of sleeping medicine. States "I do it all the time." Rn unable to convince patient to stay. Witnessed leaving the department.

## 2016-09-12 NOTE — ED Triage Notes (Signed)
Pt asking for update, states she's tired and wants to drive home and sleep. States she took at zolpidem. Encouraged patient to stay, informed that it is dangerous to drive after taking sleeping medicine. Pt willing to stay another half hour.

## 2016-09-14 DIAGNOSIS — N2581 Secondary hyperparathyroidism of renal origin: Secondary | ICD-10-CM | POA: Diagnosis not present

## 2016-09-14 DIAGNOSIS — N186 End stage renal disease: Secondary | ICD-10-CM | POA: Diagnosis not present

## 2016-09-14 DIAGNOSIS — D631 Anemia in chronic kidney disease: Secondary | ICD-10-CM | POA: Diagnosis not present

## 2016-09-16 DIAGNOSIS — D631 Anemia in chronic kidney disease: Secondary | ICD-10-CM | POA: Diagnosis not present

## 2016-09-16 DIAGNOSIS — N2581 Secondary hyperparathyroidism of renal origin: Secondary | ICD-10-CM | POA: Diagnosis not present

## 2016-09-16 DIAGNOSIS — N186 End stage renal disease: Secondary | ICD-10-CM | POA: Diagnosis not present

## 2016-09-18 DIAGNOSIS — D631 Anemia in chronic kidney disease: Secondary | ICD-10-CM | POA: Diagnosis not present

## 2016-09-18 DIAGNOSIS — N2581 Secondary hyperparathyroidism of renal origin: Secondary | ICD-10-CM | POA: Diagnosis not present

## 2016-09-18 DIAGNOSIS — N186 End stage renal disease: Secondary | ICD-10-CM | POA: Diagnosis not present

## 2016-09-21 DIAGNOSIS — N186 End stage renal disease: Secondary | ICD-10-CM | POA: Diagnosis not present

## 2016-09-21 DIAGNOSIS — D631 Anemia in chronic kidney disease: Secondary | ICD-10-CM | POA: Diagnosis not present

## 2016-09-21 DIAGNOSIS — N2581 Secondary hyperparathyroidism of renal origin: Secondary | ICD-10-CM | POA: Diagnosis not present

## 2016-09-23 DIAGNOSIS — N2581 Secondary hyperparathyroidism of renal origin: Secondary | ICD-10-CM | POA: Diagnosis not present

## 2016-09-23 DIAGNOSIS — N186 End stage renal disease: Secondary | ICD-10-CM | POA: Diagnosis not present

## 2016-09-23 DIAGNOSIS — D631 Anemia in chronic kidney disease: Secondary | ICD-10-CM | POA: Diagnosis not present

## 2016-09-25 DIAGNOSIS — D631 Anemia in chronic kidney disease: Secondary | ICD-10-CM | POA: Diagnosis not present

## 2016-09-25 DIAGNOSIS — N2581 Secondary hyperparathyroidism of renal origin: Secondary | ICD-10-CM | POA: Diagnosis not present

## 2016-09-25 DIAGNOSIS — N186 End stage renal disease: Secondary | ICD-10-CM | POA: Diagnosis not present

## 2016-09-28 DIAGNOSIS — D631 Anemia in chronic kidney disease: Secondary | ICD-10-CM | POA: Diagnosis not present

## 2016-09-28 DIAGNOSIS — N186 End stage renal disease: Secondary | ICD-10-CM | POA: Diagnosis not present

## 2016-09-28 DIAGNOSIS — N2581 Secondary hyperparathyroidism of renal origin: Secondary | ICD-10-CM | POA: Diagnosis not present

## 2016-09-30 DIAGNOSIS — N186 End stage renal disease: Secondary | ICD-10-CM | POA: Diagnosis not present

## 2016-09-30 DIAGNOSIS — N2581 Secondary hyperparathyroidism of renal origin: Secondary | ICD-10-CM | POA: Diagnosis not present

## 2016-09-30 DIAGNOSIS — D631 Anemia in chronic kidney disease: Secondary | ICD-10-CM | POA: Diagnosis not present

## 2016-10-02 DIAGNOSIS — N186 End stage renal disease: Secondary | ICD-10-CM | POA: Diagnosis not present

## 2016-10-02 DIAGNOSIS — D631 Anemia in chronic kidney disease: Secondary | ICD-10-CM | POA: Diagnosis not present

## 2016-10-02 DIAGNOSIS — N2581 Secondary hyperparathyroidism of renal origin: Secondary | ICD-10-CM | POA: Diagnosis not present

## 2016-10-05 DIAGNOSIS — D631 Anemia in chronic kidney disease: Secondary | ICD-10-CM | POA: Diagnosis not present

## 2016-10-05 DIAGNOSIS — N186 End stage renal disease: Secondary | ICD-10-CM | POA: Diagnosis not present

## 2016-10-05 DIAGNOSIS — N2581 Secondary hyperparathyroidism of renal origin: Secondary | ICD-10-CM | POA: Diagnosis not present

## 2016-10-07 DIAGNOSIS — N2581 Secondary hyperparathyroidism of renal origin: Secondary | ICD-10-CM | POA: Diagnosis not present

## 2016-10-07 DIAGNOSIS — N186 End stage renal disease: Secondary | ICD-10-CM | POA: Diagnosis not present

## 2016-10-07 DIAGNOSIS — D631 Anemia in chronic kidney disease: Secondary | ICD-10-CM | POA: Diagnosis not present

## 2016-10-09 DIAGNOSIS — N2581 Secondary hyperparathyroidism of renal origin: Secondary | ICD-10-CM | POA: Diagnosis not present

## 2016-10-09 DIAGNOSIS — N186 End stage renal disease: Secondary | ICD-10-CM | POA: Diagnosis not present

## 2016-10-09 DIAGNOSIS — D631 Anemia in chronic kidney disease: Secondary | ICD-10-CM | POA: Diagnosis not present

## 2016-10-12 DIAGNOSIS — D631 Anemia in chronic kidney disease: Secondary | ICD-10-CM | POA: Diagnosis not present

## 2016-10-12 DIAGNOSIS — N186 End stage renal disease: Secondary | ICD-10-CM | POA: Diagnosis not present

## 2016-10-12 DIAGNOSIS — N2581 Secondary hyperparathyroidism of renal origin: Secondary | ICD-10-CM | POA: Diagnosis not present

## 2016-10-13 DIAGNOSIS — N186 End stage renal disease: Secondary | ICD-10-CM | POA: Diagnosis not present

## 2016-10-13 DIAGNOSIS — Z992 Dependence on renal dialysis: Secondary | ICD-10-CM | POA: Diagnosis not present

## 2016-10-13 DIAGNOSIS — T8612 Kidney transplant failure: Secondary | ICD-10-CM | POA: Diagnosis not present

## 2016-10-14 ENCOUNTER — Encounter: Payer: Self-pay | Admitting: Vascular Surgery

## 2016-10-14 DIAGNOSIS — N186 End stage renal disease: Secondary | ICD-10-CM | POA: Diagnosis not present

## 2016-10-14 DIAGNOSIS — N2581 Secondary hyperparathyroidism of renal origin: Secondary | ICD-10-CM | POA: Diagnosis not present

## 2016-10-14 DIAGNOSIS — D631 Anemia in chronic kidney disease: Secondary | ICD-10-CM | POA: Diagnosis not present

## 2016-10-15 ENCOUNTER — Ambulatory Visit (INDEPENDENT_AMBULATORY_CARE_PROVIDER_SITE_OTHER): Payer: Medicare Other | Admitting: Vascular Surgery

## 2016-10-15 ENCOUNTER — Encounter: Payer: Self-pay | Admitting: Vascular Surgery

## 2016-10-15 ENCOUNTER — Other Ambulatory Visit: Payer: Self-pay

## 2016-10-15 VITALS — BP 150/77 | HR 76 | Temp 97.0°F | Resp 16 | Ht 62.0 in | Wt 117.0 lb

## 2016-10-15 DIAGNOSIS — N186 End stage renal disease: Secondary | ICD-10-CM | POA: Diagnosis not present

## 2016-10-15 DIAGNOSIS — Z992 Dependence on renal dialysis: Secondary | ICD-10-CM | POA: Diagnosis not present

## 2016-10-15 NOTE — Progress Notes (Signed)
Referring Physician: Ernest Haber PA-C  Patient name: [name] MRN: [id number] DOB: Nov 18, 1951 Sex: female  REASON FOR CONSULT: Aneurysmal degeneration left arm AV fistula  HPI: [name] is a 65 y.o. female, who currently dialyzes through a left upper arm AV fistula. She has developed some aneurysmal degeneration of this over the last few months. The fistula was placed in December 2016 by Dr. Trula Slade. She has had no bleeding episodes. She currently dialyzes Monday Wednesday Friday. She has no numbness or tingling in her hand. She does have some coolness in her hand. Other medical problems include congestive failure, anemia, hypertension, hyperlipidemia all of which are currently stable.  Past Medical History:  Diagnosis Date  . Adenomatous polyp of colon 10/2010, 2006, 2015  . Anemia in CKD (chronic kidney disease) 11/07/2012   s/p blood transfusion.   . Arthritis   . CHF (congestive heart failure) ([place])   . Constipation   . Depression with anxiety   . Diverticula, colon   . 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  Had deceased donor kidney transplant in 1996.  Had some early rejection then stable function for years, then had slow decline of function and went back on hemodialysis in 2012.  Gets HD TTS schedule at [facility] on [facility] still using L forearm AVF.     Marland Kitchen GERD (gastroesophageal reflux disease)   . Headache   . Hyperlipidemia   . Hypertension   . Neuromuscular disorder (HCC)    neuropathy hand and legs  . Osteoporosis   . Pneumonia   . Stroke [facility]) 11/2015   TIA  . Weight loss, unintentional    Past Surgical History:  Procedure Laterality Date  . AV FISTULA PLACEMENT     for dialysis  . AV FISTULA PLACEMENT Left 11/22/2015   Procedure: ARTERIOVENOUS (AV) FISTULA CREATION-LEFT BRACHIOCEPHALIC;  Surgeon: Serafina Mitchell, MD;  Location: [place];  Service: Vascular;   Laterality: Left;  . BACK SURGERY    . CERVICAL FUSION    . CHOLECYSTECTOMY  12/02/2012   Procedure: LAPAROSCOPIC CHOLECYSTECTOMY WITH INTRAOPERATIVE CHOLANGIOGRAM;  Surgeon: Edward Jolly, MD;  Location: [place];  Service: General;  Laterality: N/A;  . EYE SURGERY Bilateral    cataract surgery  . KIDNEY TRANSPLANT  1996  . RESECTION OF ARTERIOVENOUS FISTULA ANEURYSM Left 11/22/2015   Procedure: RESECTION OF LEFT RADIOCEPHALIC FISTULA ANEURYSM ;  Surgeon: Serafina Mitchell, MD;  Location: [facility] OR;  Service: Vascular;  Laterality: Left;    Family History  Problem Relation Age of Onset  . Colon cancer Brother   . Cancer Brother   . Coronary artery disease Mother 69  . Hyperlipidemia Mother   . Hypertension Mother   . Esophageal cancer Neg Hx   . Stomach cancer Neg Hx   . Rectal cancer Neg Hx     SOCIAL HISTORY: Social History   Social History  . Marital status: Widowed    Spouse name: N/A  . Number of children: 2  . Years of education: N/A   Occupational History  . Not on file.   Social History Main Topics  . Smoking status: Former Smoker    Types: Cigarettes    Quit date: 12/31/1991  . Smokeless tobacco: Never Used  . Alcohol use No  . Drug use: No  . Sexual activity: No   Other Topics Concern  . Not on  file   Social History Narrative  . No narrative on file    Allergies  Allergen Reactions  . Sulfa Antibiotics Other (See Comments)    Both parents allergic-so will not take    Current Outpatient Prescriptions  Medication Sig Dispense Refill  . acetaminophen (TYLENOL) 325 MG tablet Take 650 mg by mouth every 6 (six) hours as needed for mild pain.    Marland Kitchen acetaminophen-codeine (TYLENOL #3) 300-30 MG tablet Take 1-2 tablets by mouth every 6 (six) hours as needed for moderate pain. 15 tablet 0  . allopurinol (ZYLOPRIM) 300 MG tablet Take 150 mg by mouth at bedtime.    . ALPRAZolam (XANAX) 0.25 MG tablet Take 0.25 mg by mouth 2 (two) times daily as needed for  anxiety.     Marland Kitchen aspirin EC 325 MG tablet Take 325 mg by mouth at bedtime.    . B Complex-C-Folic Acid (DIALYVITE PO) Take 1 tablet by mouth daily.    Marland Kitchen doxazosin (CARDURA) 8 MG tablet Take 1 tablet (8 mg total) by mouth 2 (two) times daily as needed (hypertension). (Patient taking differently: Take 8 mg by mouth at bedtime. )    . labetalol (NORMODYNE) 300 MG tablet Take 300 mg by mouth 2 (two) times daily.    Marland Kitchen lactulose (CHRONULAC) 10 GM/15ML solution Take 20 g by mouth daily as needed for mild constipation.    Marland Kitchen losartan (COZAAR) 100 MG tablet Take 100 mg by mouth at bedtime.     . meclizine (ANTIVERT) 25 MG tablet Take 1 tablet (25 mg total) by mouth 2 (two) times daily as needed for dizziness. 30 tablet 0  . promethazine (PHENERGAN) 25 MG tablet Take 25 mg by mouth every 6 (six) hours as needed for nausea or vomiting.    . simvastatin (ZOCOR) 10 MG tablet Take 10 mg by mouth at bedtime.     Marland Kitchen zolpidem (AMBIEN) 10 MG tablet Take 10 mg by mouth at bedtime.     No current facility-administered medications for this visit.     ROS:   General:  No weight loss, Fever, chills  HEENT: No recent headaches, no nasal bleeding, no visual changes, no sore throat  Neurologic: No dizziness, blackouts, seizures. No recent symptoms of stroke or mini- stroke. No recent episodes of slurred speech, or temporary blindness.  Cardiac: No recent episodes of chest pain/pressure, no shortness of breath at rest.  + shortness of breath with exertion.  Denies history of atrial fibrillation or irregular heartbeat  Vascular: No history of rest pain in feet.  No history of claudication.  No history of non-healing ulcer, No history of DVT   Pulmonary: No home oxygen, no productive cough, no hemoptysis,  No asthma or wheezing  Musculoskeletal:  [ ]  Arthritis, [ ]  Low back pain,  [ ]  Joint pain  Hematologic:No history of hypercoagulable state.  No history of easy bleeding.  No history of anemia  Gastrointestinal:  No hematochezia or melena,  No gastroesophageal reflux, no trouble swallowing  Urinary: [X]  chronic Kidney disease, [X]  on HD - [X]  MWF or [ ]  TTHS, [ ]  Burning with urination, [ ]  Frequent urination, [ ]  Difficulty urinating;   Skin: No rashes  Psychological: No history of anxiety,  No history of depression   Physical Examination  Vitals:   10/15/16 0956  BP: (!) 150/77  Pulse: 76  Resp: 16  Temp: 97 F (36.1 C)  TempSrc: Oral  SpO2: 96%  Weight: 117 lb (53.1 kg)  Height:  5\' 2"  (1.575 m)    Body mass index is 21.4 kg/m.  General:  Alert and oriented, no acute distress HEENT: Normal Pulmonary: Clear to auscultation bilaterally Cardiac: Regular Rate and Rhythm  Skin: No rash or ulcer Extremity Pulses:  Left upper arm fistula slightly pulsatile in character especially at the cephalic subclavian junction in the deltopectoral groove. 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 Not currently at risk of bleeding. However it is pulsatile quality and that reason the aneurysms may be developing as if she has central venous stenosis.   PLAN:  Left arm fistulogram scheduled for 10/23/2016. Risks benefits possible, patient's and procedure details were discussed with the patient today including not limited to bleeding infection thrombosis of the fistula contrast reaction. She understands and agrees to proceed.   Ruta Hinds, MD [facility] Office: [phone] Pager: [phone]

## 2016-10-16 DIAGNOSIS — D631 Anemia in chronic kidney disease: Secondary | ICD-10-CM | POA: Diagnosis not present

## 2016-10-16 DIAGNOSIS — N2581 Secondary hyperparathyroidism of renal origin: Secondary | ICD-10-CM | POA: Diagnosis not present

## 2016-10-16 DIAGNOSIS — N186 End stage renal disease: Secondary | ICD-10-CM | POA: Diagnosis not present

## 2016-10-19 DIAGNOSIS — D631 Anemia in chronic kidney disease: Secondary | ICD-10-CM | POA: Diagnosis not present

## 2016-10-19 DIAGNOSIS — N186 End stage renal disease: Secondary | ICD-10-CM | POA: Diagnosis not present

## 2016-10-19 DIAGNOSIS — N2581 Secondary hyperparathyroidism of renal origin: Secondary | ICD-10-CM | POA: Diagnosis not present

## 2016-10-21 ENCOUNTER — Emergency Department (HOSPITAL_COMMUNITY)
Admission: EM | Admit: 2016-10-21 | Discharge: 2016-10-21 | Disposition: A | Discharge status: Home / Self Care | Payer: Medicare Other | Attending: Emergency Medicine | Admitting: Emergency Medicine

## 2016-10-21 ENCOUNTER — Encounter (HOSPITAL_COMMUNITY): Payer: Self-pay | Admitting: Emergency Medicine

## 2016-10-21 DIAGNOSIS — Y732 Prosthetic and other implants, materials and accessory gastroenterology and urology devices associated with adverse incidents: Secondary | ICD-10-CM | POA: Diagnosis not present

## 2016-10-21 DIAGNOSIS — Z8673 Personal history of transient ischemic attack (TIA), and cerebral infarction without residual deficits: Secondary | ICD-10-CM | POA: Insufficient documentation

## 2016-10-21 DIAGNOSIS — I509 Heart failure, unspecified: Secondary | ICD-10-CM | POA: Insufficient documentation

## 2016-10-21 DIAGNOSIS — R58 Hemorrhage, not elsewhere classified: Secondary | ICD-10-CM

## 2016-10-21 DIAGNOSIS — N186 End stage renal disease: Secondary | ICD-10-CM | POA: Diagnosis not present

## 2016-10-21 DIAGNOSIS — I770 Arteriovenous fistula, acquired: Secondary | ICD-10-CM | POA: Diagnosis not present

## 2016-10-21 DIAGNOSIS — Z7982 Long term (current) use of aspirin: Secondary | ICD-10-CM | POA: Diagnosis not present

## 2016-10-21 DIAGNOSIS — Z992 Dependence on renal dialysis: Secondary | ICD-10-CM | POA: Diagnosis not present

## 2016-10-21 DIAGNOSIS — Z87891 Personal history of nicotine dependence: Secondary | ICD-10-CM | POA: Diagnosis not present

## 2016-10-21 DIAGNOSIS — Z79899 Other long term (current) drug therapy: Secondary | ICD-10-CM | POA: Insufficient documentation

## 2016-10-21 DIAGNOSIS — I132 Hypertensive heart and chronic kidney disease with heart failure and with stage 5 chronic kidney disease, or end stage renal disease: Secondary | ICD-10-CM | POA: Insufficient documentation

## 2016-10-21 DIAGNOSIS — T82838A Hemorrhage of vascular prosthetic devices, implants and grafts, initial encounter: Secondary | ICD-10-CM | POA: Insufficient documentation

## 2016-10-21 DIAGNOSIS — I120 Hypertensive chronic kidney disease with stage 5 chronic kidney disease or end stage renal disease: Secondary | ICD-10-CM | POA: Diagnosis not present

## 2016-10-21 LAB — BASIC METABOLIC PANEL
Anion gap: 15 (ref 5–15)
BUN: 63 mg/dL — AB (ref 6–20)
CALCIUM: 8.7 mg/dL — AB (ref 8.9–10.3)
CHLORIDE: 94 mmol/L — AB (ref 101–111)
CO2: 29 mmol/L (ref 22–32)
CREATININE: 8.2 mg/dL — AB (ref 0.44–1.00)
GFR calc non Af Amer: 5 mL/min — ABNORMAL LOW (ref >60)
GFR, EST AFRICAN AMERICAN: 5 mL/min — AB (ref >60)
GLUCOSE: 89 mg/dL (ref 65–99)
Potassium: 4.4 mmol/L (ref 3.5–5.1)
Sodium: 138 mmol/L (ref 135–145)

## 2016-10-21 NOTE — ED Triage Notes (Signed)
Pt. Stated, I missed my dialysis  And I probably need my labs checked

## 2016-10-21 NOTE — ED Triage Notes (Signed)
Pt states her dialysis fistula has been bleeding since yesterday. She has dressing on site and bleeding appears controlled- no blood on outside of bandage. Pt has dialysis today but came here to get bleeding under control.

## 2016-10-21 NOTE — Discharge Instructions (Signed)
Please come back to the ER right away if your fistula starts to bleed. You should go to your fistulagram appointment on Friday as scheduled and get dialysis after that. If you wake up tomorrow and feel more short of breath, you can call your dialysis center to get dialyzed tomorrow if you feel you need it.

## 2016-10-21 NOTE — ED Notes (Signed)
The pt is not bleeding from her fistula site. This RN removed the dressing off of the site that was placed at dialysis, there was no blood present on the dressing, no bleeding at this time.

## 2016-10-21 NOTE — ED Provider Notes (Signed)
[facility] DEPT Provider Note   CSN: [id number] Arrival date & time: 10/21/16  1448     History   Chief Complaint Chief Complaint  Patient presents with  . Vascular Access Problem    HPI [name] is a 65 y.o. female.  Patient presents went from her dialysis center for bleeding from her fistula site that started yesterday. Dialysis center did not want to re-stick her so sent her here. She has a fistulogram scheduled for Friday. Bleeding has now stopped. She denies symptoms right now, no chest pain or difficulty breathing.   The history is provided by the patient. No language interpreter was used.  Illness  This is a new problem. The current episode started 12 to 24 hours ago. The problem occurs constantly. The problem has been resolved. Pertinent negatives include no chest pain, no abdominal pain and no shortness of breath. Exacerbated by: Nothing. Relieved by: Direct pressure. Treatments tried: Pressure. The treatment provided significant relief.    Past Medical History:  Diagnosis Date  . Adenomatous polyp of colon 10/2010, 2006, 2015  . Anemia in CKD (chronic kidney disease) 11/07/2012   s/p blood transfusion.   . Arthritis   . CHF (congestive heart failure) ([place])   . Constipation   . Depression with anxiety   . Diverticula, colon   . 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  Had deceased donor kidney transplant in 1996.  Had some early rejection then stable function for years, then had slow decline of function and went back on hemodialysis in 2012.  Gets HD TTS schedule at [facility] on [facility] still using L forearm AVF.     Marland Kitchen GERD (gastroesophageal reflux disease)   . Headache   . Hyperlipidemia   . Hypertension   . Neuromuscular disorder (HCC)    neuropathy hand and legs  . Osteoporosis   . Pneumonia   . Stroke [facility]) 11/2015   TIA  . Weight loss, unintentional      Patient Active Problem List   Diagnosis Date Noted  . Acute ischemic colitis ([place]) 05/16/2016  . Colitis 05/15/2016  . Rectal bleeding 05/15/2016  . Neurologic abnormality 11/19/2015  . Altered mental state 11/19/2015  . Hyperkalemia 11/19/2015  . Anxiety 11/19/2015  . Insomnia 11/19/2015  . ESRD (end stage renal disease) on dialysis ([place])   . Gait instability   . Stroke-like symptom 11/16/2015  . Vestibular neuritis 11/16/2015  . Stroke (cerebrum) ([place]) 11/16/2015  . Dizziness 05/09/2015  . Ataxia 05/09/2015  . H/O: CVA (cerebrovascular accident) 05/09/2015  . Left facial numbness 05/09/2015  . Left leg numbness 05/09/2015  . Hyperlipidemia   . CHF (congestive heart failure) ([place])   . Congestive heart disease ([place])   . Shortness of breath 04/01/2013  . Volume overload 04/01/2013  . HTN (hypertension) 04/01/2013  . Cholecystitis, acute 11/07/2012  . ESRD (end stage renal disease) ([place]) 11/07/2012  . GERD (gastroesophageal reflux disease) 11/07/2012  . Anemia in CKD (chronic kidney disease) 11/07/2012    Past Surgical History:  Procedure Laterality Date  . AV FISTULA PLACEMENT     for dialysis  . AV FISTULA PLACEMENT Left 11/22/2015   Procedure: ARTERIOVENOUS (AV) FISTULA CREATION-LEFT BRACHIOCEPHALIC;  Surgeon: Serafina Mitchell, MD;  Location: [place];  Service: Vascular;  Laterality: Left;  . BACK SURGERY    . CERVICAL FUSION    . CHOLECYSTECTOMY  12/02/2012  Procedure: LAPAROSCOPIC CHOLECYSTECTOMY WITH INTRAOPERATIVE CHOLANGIOGRAM;  Surgeon: Edward Jolly, MD;  Location: [facility] OR;  Service: General;  Laterality: N/A;  . EYE SURGERY Bilateral    cataract surgery  . KIDNEY TRANSPLANT  1996  . RESECTION OF ARTERIOVENOUS FISTULA ANEURYSM Left 11/22/2015   Procedure: RESECTION OF LEFT RADIOCEPHALIC FISTULA ANEURYSM ;  Surgeon: Serafina Mitchell, MD;  Location: [facility] OR;  Service: Vascular;  Laterality: Left;    OB History    No data available       Home  Medications    Prior to Admission medications   Medication Sig Start Date End Date Taking? Authorizing Provider  acetaminophen (TYLENOL) 325 MG tablet Take 650 mg by mouth every 6 (six) hours as needed for mild pain.    Historical Provider, MD  acetaminophen-codeine (TYLENOL #3) 300-30 MG tablet Take 1-2 tablets by mouth every 6 (six) hours as needed for moderate pain. 06/11/16   Domenic Moras, PA-C  allopurinol (ZYLOPRIM) 300 MG tablet Take 150 mg by mouth at bedtime.    Historical Provider, MD  ALPRAZolam Duanne Moron) 0.25 MG tablet Take 0.25 mg by mouth 2 (two) times daily as needed for anxiety.     Historical Provider, MD  aspirin EC 325 MG tablet Take 325 mg by mouth at bedtime.    Historical Provider, MD  B Complex-C-Folic Acid (DIALYVITE PO) Take 1 tablet by mouth daily.    Historical Provider, MD  doxazosin (CARDURA) 8 MG tablet Take 1 tablet (8 mg total) by mouth 2 (two) times daily as needed (hypertension). Patient taking differently: Take 8 mg by mouth at bedtime.  11/17/15   Modena Jansky, MD  labetalol (NORMODYNE) 100 MG tablet Take 100 mg by mouth 2 (two) times daily.    Historical Provider, MD  lactulose (CHRONULAC) 10 GM/15ML solution Take 20 g by mouth daily as needed for mild constipation.    Historical Provider, MD  losartan (COZAAR) 100 MG tablet Take 100 mg by mouth at bedtime.     Historical Provider, MD  meclizine (ANTIVERT) 25 MG tablet Take 1 tablet (25 mg total) by mouth 2 (two) times daily as needed for dizziness. 06/24/16   Merryl Hacker, MD  promethazine (PHENERGAN) 25 MG tablet Take 25 mg by mouth every 6 (six) hours as needed for nausea or vomiting.    Historical Provider, MD  SENSIPAR 30 MG tablet Take 30 mg by mouth at bedtime. 08/20/16   Historical Provider, MD  simvastatin (ZOCOR) 10 MG tablet Take 10 mg by mouth at bedtime.     Historical Provider, MD  zolpidem (AMBIEN) 10 MG tablet Take 10 mg by mouth at bedtime as needed for sleep.     Historical Provider, MD     Family History Family History  Problem Relation Age of Onset  . Colon cancer Brother   . Cancer Brother   . Coronary artery disease Mother 79  . Hyperlipidemia Mother   . Hypertension Mother   . Esophageal cancer Neg Hx   . Stomach cancer Neg Hx   . Rectal cancer Neg Hx     Social History Social History  Substance Use Topics  . Smoking status: Former Smoker    Types: Cigarettes    Quit date: 12/31/1991  . Smokeless tobacco: Never Used  . Alcohol use No     Allergies   Sulfa antibiotics   Review of Systems Review of Systems  Constitutional: Negative for fever.  HENT: Negative.   Respiratory: Negative for shortness  of breath.   Cardiovascular: Negative for chest pain.  Gastrointestinal: Negative for abdominal pain.  Genitourinary: Negative.   Musculoskeletal: Negative.   Skin: Negative.   Allergic/Immunologic: Negative for immunocompromised state.  Neurological: Negative.   Hematological: Bruises/bleeds easily.  Psychiatric/Behavioral: Negative.      Physical Exam Updated Vital Signs BP (!) 195/104   Pulse 82   Temp 97.4 F (36.3 C) (Oral)   Resp 16   Ht 5\' 2"  (1.575 m)   Wt 53.5 kg   SpO2 100%   BMI 21.58 kg/m   Physical Exam  Constitutional: She is oriented to person, place, and time. She appears well-developed and well-nourished. No distress.  HENT:  Head: Normocephalic and atraumatic.  Eyes: Conjunctivae and EOM are normal. Pupils are equal, round, and reactive to light. No scleral icterus.  Neck: Normal range of motion. Neck supple.  Cardiovascular: Normal rate, regular rhythm and normal heart sounds.   Pulmonary/Chest: Effort normal and breath sounds normal. No respiratory distress. She has no wheezes. She has no rales.  Musculoskeletal:  L brachiocephalic fistula with strong palpable thrill. No active bleeding. Small scab over area of bleeding without signs of ulceration or necrosis.  Neurological: She is alert and oriented to person,  place, and time.  Skin: Skin is warm and dry. She is not diaphoretic.  Psychiatric: She has a normal mood and affect. Her behavior is normal. Judgment and thought content normal.     ED Treatments / Results  Labs (all labs ordered are listed, but only abnormal results are displayed) Labs Reviewed  BASIC METABOLIC PANEL - Abnormal; Notable for the following:       Result Value   Chloride 94 (*)    BUN 63 (*)    Creatinine, Ser 8.20 (*)    Calcium 8.7 (*)    GFR calc non Af Amer 5 (*)    GFR calc Af Amer 5 (*)    All other components within normal limits    EKG  EKG Interpretation None       Radiology No results found.  Procedures Procedures (including critical care time)  Medications Ordered in ED Medications - No data to display   Initial Impression / Assessment and Plan / ED Course  I have reviewed the triage vital signs and the nursing notes.  Pertinent labs & imaging results that were available during my care of the patient were reviewed by me and considered in my medical decision making (see chart for details).  Clinical Course     Patient presents from dialysis center with concern for bleeding from her left AV fistula. She is well-appearing on examination and has no active bleeding. Has a small scab on the fistula in the area of the bleeding, but no signs of any ulceration. Do not feel this requires emergent vascular consultation given its appearance. She has a fistulogram scheduled for Friday morning. BMP revealed no signs of hyperkalemia or acidosis. She is breathing comfortably on room air. No indication for emergent dialysis. Given her potassium of 4.4 she can likely go until Friday before dialysis and get her scheduled session on Friday afternoon. Discussed with her that if she feels symptomatic tomorrow she should call and get dialyzed tomorrow. Gave strict return precautions for any return of bleeding or fistula problems. She expressed understanding of this  and is in good condition for discharge home.  Final Clinical Impressions(s) / ED Diagnoses   Final diagnoses:  ESRD (end stage renal disease) ([place])  AV (arteriovenous  fistula) [facility])  Bleeding    New Prescriptions Current Discharge Medication List       Harlin Heys, MD 10/21/16 2020    Tanna Furry, MD 11/10/16 1556

## 2016-10-22 DIAGNOSIS — D631 Anemia in chronic kidney disease: Secondary | ICD-10-CM | POA: Diagnosis not present

## 2016-10-22 DIAGNOSIS — N2581 Secondary hyperparathyroidism of renal origin: Secondary | ICD-10-CM | POA: Diagnosis not present

## 2016-10-22 DIAGNOSIS — N186 End stage renal disease: Secondary | ICD-10-CM | POA: Diagnosis not present

## 2016-10-23 ENCOUNTER — Encounter (HOSPITAL_COMMUNITY)
Admission: RE | Disposition: A | Discharge status: Home / Self Care | Payer: Self-pay | Source: Ambulatory Visit | Attending: Vascular Surgery

## 2016-10-23 ENCOUNTER — Encounter (HOSPITAL_COMMUNITY): Payer: Self-pay | Admitting: Vascular Surgery

## 2016-10-23 ENCOUNTER — Ambulatory Visit (HOSPITAL_COMMUNITY)
Admission: RE | Admit: 2016-10-23 | Discharge: 2016-10-23 | Disposition: A | Discharge status: Home / Self Care | Payer: Medicare Other | Source: Ambulatory Visit | Attending: Vascular Surgery | Admitting: Vascular Surgery

## 2016-10-23 DIAGNOSIS — M810 Age-related osteoporosis without current pathological fracture: Secondary | ICD-10-CM | POA: Insufficient documentation

## 2016-10-23 DIAGNOSIS — I132 Hypertensive heart and chronic kidney disease with heart failure and with stage 5 chronic kidney disease, or end stage renal disease: Secondary | ICD-10-CM | POA: Insufficient documentation

## 2016-10-23 DIAGNOSIS — Z87891 Personal history of nicotine dependence: Secondary | ICD-10-CM | POA: Diagnosis not present

## 2016-10-23 DIAGNOSIS — Z8249 Family history of ischemic heart disease and other diseases of the circulatory system: Secondary | ICD-10-CM | POA: Insufficient documentation

## 2016-10-23 DIAGNOSIS — Y832 Surgical operation with anastomosis, bypass or graft as the cause of abnormal reaction of the patient, or of later complication, without mention of misadventure at the time of the procedure: Secondary | ICD-10-CM | POA: Diagnosis not present

## 2016-10-23 DIAGNOSIS — T82898A Other specified complication of vascular prosthetic devices, implants and grafts, initial encounter: Secondary | ICD-10-CM | POA: Diagnosis not present

## 2016-10-23 DIAGNOSIS — Z8673 Personal history of transient ischemic attack (TIA), and cerebral infarction without residual deficits: Secondary | ICD-10-CM | POA: Diagnosis not present

## 2016-10-23 DIAGNOSIS — Z940 Kidney transplant status: Secondary | ICD-10-CM | POA: Insufficient documentation

## 2016-10-23 DIAGNOSIS — N186 End stage renal disease: Secondary | ICD-10-CM | POA: Diagnosis not present

## 2016-10-23 DIAGNOSIS — K219 Gastro-esophageal reflux disease without esophagitis: Secondary | ICD-10-CM | POA: Insufficient documentation

## 2016-10-23 DIAGNOSIS — E785 Hyperlipidemia, unspecified: Secondary | ICD-10-CM | POA: Insufficient documentation

## 2016-10-23 DIAGNOSIS — Z79899 Other long term (current) drug therapy: Secondary | ICD-10-CM | POA: Insufficient documentation

## 2016-10-23 DIAGNOSIS — I509 Heart failure, unspecified: Secondary | ICD-10-CM | POA: Insufficient documentation

## 2016-10-23 DIAGNOSIS — G709 Myoneural disorder, unspecified: Secondary | ICD-10-CM | POA: Insufficient documentation

## 2016-10-23 DIAGNOSIS — Z992 Dependence on renal dialysis: Secondary | ICD-10-CM | POA: Insufficient documentation

## 2016-10-23 DIAGNOSIS — D631 Anemia in chronic kidney disease: Secondary | ICD-10-CM | POA: Diagnosis not present

## 2016-10-23 DIAGNOSIS — Z7982 Long term (current) use of aspirin: Secondary | ICD-10-CM | POA: Insufficient documentation

## 2016-10-23 HISTORY — PX: PERIPHERAL VASCULAR CATHETERIZATION: SHX172C

## 2016-10-23 LAB — POCT I-STAT, CHEM 8
BUN: 37 mg/dL — ABNORMAL HIGH (ref 6–20)
CREATININE: 5.5 mg/dL — AB (ref 0.44–1.00)
Calcium, Ion: 0.98 mmol/L — ABNORMAL LOW (ref 1.15–1.40)
Chloride: 95 mmol/L — ABNORMAL LOW (ref 101–111)
GLUCOSE: 82 mg/dL (ref 65–99)
HEMATOCRIT: 37 % (ref 36.0–46.0)
HEMOGLOBIN: 12.6 g/dL (ref 12.0–15.0)
POTASSIUM: 4.3 mmol/L (ref 3.5–5.1)
Sodium: 136 mmol/L (ref 135–145)
TCO2: 32 mmol/L (ref 0–100)

## 2016-10-23 SURGERY — A/V SHUNTOGRAM/FISTULAGRAM
Anesthesia: LOCAL | Laterality: Left

## 2016-10-23 MED ORDER — ACETAMINOPHEN 325 MG RE SUPP: 325.0000 mg | RECTAL | Status: DC | PRN

## 2016-10-23 MED ORDER — ONDANSETRON HCL 4 MG/2ML IJ SOLN: 4.0000 mg | Freq: Four times a day (QID) | INTRAMUSCULAR | Status: DC | PRN

## 2016-10-23 MED ORDER — METOPROLOL TARTRATE 5 MG/5ML IV SOLN
2.0000 mg | INTRAVENOUS | Status: DC | PRN
Start: 2016-10-23 — End: 2016-10-23

## 2016-10-23 MED ORDER — SODIUM CHLORIDE 0.9% FLUSH: 3.0000 mL | INTRAVENOUS | Status: DC | PRN

## 2016-10-23 MED ORDER — HEPARIN (PORCINE) IN NACL 2-0.9 UNIT/ML-% IJ SOLN
INTRAMUSCULAR | Status: DC | PRN
  Administered 2016-10-23: 500 mL

## 2016-10-23 MED ORDER — LABETALOL HCL 5 MG/ML IV SOLN
10.0000 mg | INTRAVENOUS | Status: DC | PRN
  Administered 2016-10-23: 10 mg via INTRAVENOUS

## 2016-10-23 MED ORDER — GUAIFENESIN-DM 100-10 MG/5ML PO SYRP: 15.0000 mL | ORAL_SOLUTION | ORAL | Status: DC | PRN

## 2016-10-23 MED ORDER — ACETAMINOPHEN 325 MG PO TABS: 325.0000 mg | ORAL_TABLET | ORAL | Status: DC | PRN

## 2016-10-23 MED ORDER — HYDRALAZINE HCL 20 MG/ML IJ SOLN: 5.0000 mg | INTRAMUSCULAR | Status: DC | PRN

## 2016-10-23 MED ORDER — LABETALOL HCL 5 MG/ML IV SOLN
INTRAVENOUS | Status: AC
  Filled 2016-10-23: qty 4

## 2016-10-23 MED ORDER — HEPARIN (PORCINE) IN NACL 2-0.9 UNIT/ML-% IJ SOLN
INTRAMUSCULAR | Status: AC
  Filled 2016-10-23: qty 500

## 2016-10-23 MED ORDER — LIDOCAINE HCL (PF) 1 % IJ SOLN
INTRAMUSCULAR | Status: AC
  Filled 2016-10-23: qty 30

## 2016-10-23 MED ORDER — PHENOL 1.4 % MT LIQD: 1.0000 | OROMUCOSAL | Status: DC | PRN

## 2016-10-23 MED ORDER — LIDOCAINE HCL (PF) 1 % IJ SOLN
INTRAMUSCULAR | Status: DC | PRN
  Administered 2016-10-23: 2 mL

## 2016-10-23 SURGICAL SUPPLY — 10 items
BAG SNAP BAND KOVER 36X36 (MISCELLANEOUS) ×2 IMPLANT
COVER DOME SNAP 22 D (MISCELLANEOUS) ×2 IMPLANT
COVER PRB 48X5XTLSCP FOLD TPE (BAG) ×1 IMPLANT
COVER PROBE 5X48 (BAG) ×2
KIT MICROINTRODUCER STIFF 5F (SHEATH) ×1 IMPLANT
PROTECTION STATION PRESSURIZED (MISCELLANEOUS) ×2
STATION PROTECTION PRESSURIZED (MISCELLANEOUS) ×1 IMPLANT
STOPCOCK MORSE 400PSI 3WAY (MISCELLANEOUS) ×2 IMPLANT
TRAY PV CATH (CUSTOM PROCEDURE TRAY) ×2 IMPLANT
TUBING CIL FLEX 10 FLL-RA (TUBING) ×2 IMPLANT

## 2016-10-23 NOTE — Interval H&P Note (Signed)
History and Physical Interval Note:  10/23/2016 7:41 AM  [name]  has presented today for surgery, with the diagnosis of end stage renal  The various methods of treatment have been discussed with the patient and family. After consideration of risks, benefits and other options for treatment, the patient has consented to  Procedure(s): Fistulagram (Left) as a surgical intervention .  The patient's history has been reviewed, patient examined, no change in status, stable for surgery.  I have reviewed the patient's chart and labs.  Questions were answered to the patient's satisfaction.     Ruta Hinds

## 2016-10-23 NOTE — Op Note (Signed)
Procedure: Left arm fistulogram  Preoperative diagnosis: Aneurysmal degeneration left arm AV fistula Postoperative diagnosis: Same  Anesthesia: Local  Operative findings: Tortuous but widely patent left upper extremity AV fistula  Operative details: After obtaining informed consent, the patient was taken to the [place] lab. The patient was placed in supine position the Angio table. Patient's left upper extremity was prepped and draped in usual sterile fashion to the antecubital crease. Local anesthesia was demonstrated over the proximal aspect of the fistula. A micropuncture needle was used to cannulate the fistula using ultrasound guidance. Micropuncture wire was threaded through the micropuncture needle and the needle exchanged for a micropuncture sheath. This was secured in place with an OpSite. Contrast angiogram was then obtained through the fistula using a micropuncture sheath. A central venous structures including the superior vena cava and left subclavian vein are widely patent. The left cephalic vein is widely patent including the area of the cephalic subclavian junction although it is very tortuous with several kinks along the way this does not appear to limit flow. An additional oblique view at the cephalic subclavian junction was performed to confirm this. While holding compression on the upper portion of the fistula contrast was refluxed across the arterial anastomosis and this was widely patent. 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  At this point a figure-of-eight Monocryl suture was placed at the sheath exit site and hemostasis obtained with direct pressure. The patient tolerated the procedure well and there were no complications. The patient was taken to the holding area in stable condition.  Operative management: Mild aneurysmal degeneration of proximal portion of the left brachiocephalic AV fistula. If the aneurysm continues to expand over  time consideration would be given for a plication procedure. There is no significant narrowing throughout the fistula to explain the aneurysmal degeneration. Most likely this is due to frequent cannulations at the same area. Would recommend rotating stick sites as much as possible. The patient can follow-up with me on an as-needed basis if she has continued worsening of her aneurysmal degeneration or development of eschar over the fistula.  Ruta Hinds, MD [facility] [place] Office: [phone] Pager: [phone]

## 2016-10-23 NOTE — H&P (View-Only) (Signed)
Referring Physician: Ernest Haber PA-C  Patient name: [name] MRN: [id number] DOB: 07-Nov-1951 Sex: female  REASON FOR CONSULT: Aneurysmal degeneration left arm AV fistula  HPI: INDIANNA West is a 65 y.o. female, who currently dialyzes through a left upper arm AV fistula. She has developed some aneurysmal degeneration of this over the last few months. The fistula was placed in December 2016 by Dr. Trula Slade. She has had no bleeding episodes. She currently dialyzes Monday Wednesday Friday. She has no numbness or tingling in her hand. She does have some coolness in her hand. Other medical problems include congestive failure, anemia, hypertension, hyperlipidemia all of which are currently stable.  Past Medical History:  Diagnosis Date  . Adenomatous polyp of colon 10/2010, 2006, 2015  . Anemia in CKD (chronic kidney disease) 11/07/2012   s/p blood transfusion.   . Arthritis   . CHF (congestive heart failure) ([place])   . Constipation   . Depression with anxiety   . Diverticula, colon   . 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  Had deceased donor kidney transplant in 1996.  Had some early rejection then stable function for years, then had slow decline of function and went back on hemodialysis in 2012.  Gets HD TTS schedule at [facility] on [facility] still using L forearm AVF.     Marland Kitchen GERD (gastroesophageal reflux disease)   . Headache   . Hyperlipidemia   . Hypertension   . Neuromuscular disorder (HCC)    neuropathy hand and legs  . Osteoporosis   . Pneumonia   . Stroke [facility]) 11/2015   TIA  . Weight loss, unintentional    Past Surgical History:  Procedure Laterality Date  . AV FISTULA PLACEMENT     for dialysis  . AV FISTULA PLACEMENT Left 11/22/2015   Procedure: ARTERIOVENOUS (AV) FISTULA CREATION-LEFT BRACHIOCEPHALIC;  Surgeon: Serafina Mitchell, MD;  Location: [facility];  Service: Vascular;   Laterality: Left;  . BACK SURGERY    . CERVICAL FUSION    . CHOLECYSTECTOMY  12/02/2012   Procedure: LAPAROSCOPIC CHOLECYSTECTOMY WITH INTRAOPERATIVE CHOLANGIOGRAM;  Surgeon: Edward Jolly, MD;  Location: [facility];  Service: General;  Laterality: N/A;  . EYE SURGERY Bilateral    cataract surgery  . KIDNEY TRANSPLANT  1996  . RESECTION OF ARTERIOVENOUS FISTULA ANEURYSM Left 11/22/2015   Procedure: RESECTION OF LEFT RADIOCEPHALIC FISTULA ANEURYSM ;  Surgeon: Serafina Mitchell, MD;  Location: [facility] OR;  Service: Vascular;  Laterality: Left;    Family History  Problem Relation Age of Onset  . Colon cancer Brother   . Cancer Brother   . Coronary artery disease Mother 75  . Hyperlipidemia Mother   . Hypertension Mother   . Esophageal cancer Neg Hx   . Stomach cancer Neg Hx   . Rectal cancer Neg Hx     SOCIAL HISTORY: Social History   Social History  . Marital status: Widowed    Spouse name: N/A  . Number of children: 2  . Years of education: N/A   Occupational History  . Not on file.   Social History Main Topics  . Smoking status: Former Smoker    Types: Cigarettes    Quit date: 12/31/1991  . Smokeless tobacco: Never Used  . Alcohol use No  . Drug use: No  . Sexual activity: No   Other Topics Concern  . Not on  file   Social History Narrative  . No narrative on file    Allergies  Allergen Reactions  . Sulfa Antibiotics Other (See Comments)    Both parents allergic-so will not take    Current Outpatient Prescriptions  Medication Sig Dispense Refill  . acetaminophen (TYLENOL) 325 MG tablet Take 650 mg by mouth every 6 (six) hours as needed for mild pain.    Marland Kitchen acetaminophen-codeine (TYLENOL #3) 300-30 MG tablet Take 1-2 tablets by mouth every 6 (six) hours as needed for moderate pain. 15 tablet 0  . allopurinol (ZYLOPRIM) 300 MG tablet Take 150 mg by mouth at bedtime.    . ALPRAZolam (XANAX) 0.25 MG tablet Take 0.25 mg by mouth 2 (two) times daily as needed for  anxiety.     Marland Kitchen aspirin EC 325 MG tablet Take 325 mg by mouth at bedtime.    . B Complex-C-Folic Acid (DIALYVITE PO) Take 1 tablet by mouth daily.    Marland Kitchen doxazosin (CARDURA) 8 MG tablet Take 1 tablet (8 mg total) by mouth 2 (two) times daily as needed (hypertension). (Patient taking differently: Take 8 mg by mouth at bedtime. )    . labetalol (NORMODYNE) 300 MG tablet Take 300 mg by mouth 2 (two) times daily.    Marland Kitchen lactulose (CHRONULAC) 10 GM/15ML solution Take 20 g by mouth daily as needed for mild constipation.    Marland Kitchen losartan (COZAAR) 100 MG tablet Take 100 mg by mouth at bedtime.     . meclizine (ANTIVERT) 25 MG tablet Take 1 tablet (25 mg total) by mouth 2 (two) times daily as needed for dizziness. 30 tablet 0  . promethazine (PHENERGAN) 25 MG tablet Take 25 mg by mouth every 6 (six) hours as needed for nausea or vomiting.    . simvastatin (ZOCOR) 10 MG tablet Take 10 mg by mouth at bedtime.     Marland Kitchen zolpidem (AMBIEN) 10 MG tablet Take 10 mg by mouth at bedtime.     No current facility-administered medications for this visit.     ROS:   General:  No weight loss, Fever, chills  HEENT: No recent headaches, no nasal bleeding, no visual changes, no sore throat  Neurologic: No dizziness, blackouts, seizures. No recent symptoms of stroke or mini- stroke. No recent episodes of slurred speech, or temporary blindness.  Cardiac: No recent episodes of chest pain/pressure, no shortness of breath at rest.  + shortness of breath with exertion.  Denies history of atrial fibrillation or irregular heartbeat  Vascular: No history of rest pain in feet.  No history of claudication.  No history of non-healing ulcer, No history of DVT   Pulmonary: No home oxygen, no productive cough, no hemoptysis,  No asthma or wheezing  Musculoskeletal:  [ ]  Arthritis, [ ]  Low back pain,  [ ]  Joint pain  Hematologic:No history of hypercoagulable state.  No history of easy bleeding.  No history of anemia  Gastrointestinal:  No hematochezia or melena,  No gastroesophageal reflux, no trouble swallowing  Urinary: [X]  chronic Kidney disease, [X]  on HD - [X]  MWF or [ ]  TTHS, [ ]  Burning with urination, [ ]  Frequent urination, [ ]  Difficulty urinating;   Skin: No rashes  Psychological: No history of anxiety,  No history of depression   Physical Examination  Vitals:   10/15/16 0956  BP: (!) 150/77  Pulse: 76  Resp: 16  Temp: 97 F (36.1 C)  TempSrc: Oral  SpO2: 96%  Weight: 117 lb (53.1 kg)  Height:  5\' 2"  (1.575 m)    Body mass index is 21.4 kg/m.  General:  Alert and oriented, no acute distress HEENT: Normal Pulmonary: Clear to auscultation bilaterally Cardiac: Regular Rate and Rhythm  Skin: No rash or ulcer Extremity Pulses:  Left upper arm fistula slightly pulsatile in character especially at the cephalic subclavian junction in the deltopectoral groove. 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 Not currently at risk of bleeding. However it is pulsatile quality and that reason the aneurysms may be developing as if she has central venous stenosis.   PLAN:  Left arm fistulogram scheduled for 10/23/2016. Risks benefits possible, patient's and procedure details were discussed with the patient today including not limited to bleeding infection thrombosis of the fistula contrast reaction. She understands and agrees to proceed.   Ruta Hinds, MD [facility] Office: [phone] Pager: [phone]

## 2016-10-23 NOTE — Discharge Instructions (Signed)
Fistulogram, Care After °Refer to this sheet in the next few weeks. These instructions provide you with information on caring for yourself after your procedure. Your health care provider may also give you more specific instructions. Your treatment has been planned according to current medical practices, but problems sometimes occur. Call your health care provider if you have any problems or questions after your procedure. °WHAT TO EXPECT AFTER THE PROCEDURE °After your procedure, it is typical to have the following: °· A small amount of discomfort in the area where the catheters were placed. °· A small amount of bruising around the fistula. °· Sleepiness and fatigue. °HOME CARE INSTRUCTIONS °· Rest at home for the day following your procedure. °· Do not drive or operate heavy machinery while taking pain medicine. °· Take medicines only as directed by your health care provider. °· Do not take baths, swim, or use a hot tub until your health care provider approves. You may shower 24 hours after the procedure or as directed by your health care provider. °· There are many different ways to close and cover an incision, including stitches, skin glue, and adhesive strips. Follow your health care provider's instructions on: °¨ Incision care. °¨ Bandage (dressing) changes and removal. °¨ Incision closure removal. °· Monitor your dialysis fistula carefully. °SEEK MEDICAL CARE IF: °· You have drainage, redness, swelling, or pain at your catheter site. °· You have a fever. °· You have chills. °SEEK IMMEDIATE MEDICAL CARE IF: °· You feel weak. °· You have trouble balancing. °· You have trouble moving your arms or legs. °· You have problems with your speech or vision. °· You can no longer feel a vibration or buzz when you put your fingers over your dialysis fistula. °· The limb that was used for the procedure: °¨ Swells. °¨ Is painful. °¨ Is cold. °¨ Is discolored, such as blue or pale white. °  °This information is not intended  to replace advice given to you by your health care provider. Make sure you discuss any questions you have with your health care provider. °  °Document Released: 04/16/2014 Document Reviewed: 04/16/2014 °Elsevier Interactive Patient Education ©2016 Else[employer] Inc. ° °

## 2016-10-26 DIAGNOSIS — N2581 Secondary hyperparathyroidism of renal origin: Secondary | ICD-10-CM | POA: Diagnosis not present

## 2016-10-26 DIAGNOSIS — N186 End stage renal disease: Secondary | ICD-10-CM | POA: Diagnosis not present

## 2016-10-26 DIAGNOSIS — D631 Anemia in chronic kidney disease: Secondary | ICD-10-CM | POA: Diagnosis not present

## 2016-10-28 DIAGNOSIS — N186 End stage renal disease: Secondary | ICD-10-CM | POA: Diagnosis not present

## 2016-10-28 DIAGNOSIS — N2581 Secondary hyperparathyroidism of renal origin: Secondary | ICD-10-CM | POA: Diagnosis not present

## 2016-10-28 DIAGNOSIS — D631 Anemia in chronic kidney disease: Secondary | ICD-10-CM | POA: Diagnosis not present

## 2016-10-30 DIAGNOSIS — D631 Anemia in chronic kidney disease: Secondary | ICD-10-CM | POA: Diagnosis not present

## 2016-10-30 DIAGNOSIS — N186 End stage renal disease: Secondary | ICD-10-CM | POA: Diagnosis not present

## 2016-10-30 DIAGNOSIS — N2581 Secondary hyperparathyroidism of renal origin: Secondary | ICD-10-CM | POA: Diagnosis not present

## 2016-11-01 DIAGNOSIS — N186 End stage renal disease: Secondary | ICD-10-CM | POA: Diagnosis not present

## 2016-11-01 DIAGNOSIS — D631 Anemia in chronic kidney disease: Secondary | ICD-10-CM | POA: Diagnosis not present

## 2016-11-01 DIAGNOSIS — N2581 Secondary hyperparathyroidism of renal origin: Secondary | ICD-10-CM | POA: Diagnosis not present

## 2016-11-03 DIAGNOSIS — D631 Anemia in chronic kidney disease: Secondary | ICD-10-CM | POA: Diagnosis not present

## 2016-11-03 DIAGNOSIS — N2581 Secondary hyperparathyroidism of renal origin: Secondary | ICD-10-CM | POA: Diagnosis not present

## 2016-11-03 DIAGNOSIS — N186 End stage renal disease: Secondary | ICD-10-CM | POA: Diagnosis not present

## 2016-11-06 DIAGNOSIS — D631 Anemia in chronic kidney disease: Secondary | ICD-10-CM | POA: Diagnosis not present

## 2016-11-06 DIAGNOSIS — N186 End stage renal disease: Secondary | ICD-10-CM | POA: Diagnosis not present

## 2016-11-06 DIAGNOSIS — N2581 Secondary hyperparathyroidism of renal origin: Secondary | ICD-10-CM | POA: Diagnosis not present

## 2016-11-09 DIAGNOSIS — N186 End stage renal disease: Secondary | ICD-10-CM | POA: Diagnosis not present

## 2016-11-09 DIAGNOSIS — D631 Anemia in chronic kidney disease: Secondary | ICD-10-CM | POA: Diagnosis not present

## 2016-11-09 DIAGNOSIS — N2581 Secondary hyperparathyroidism of renal origin: Secondary | ICD-10-CM | POA: Diagnosis not present

## 2016-11-11 DIAGNOSIS — D631 Anemia in chronic kidney disease: Secondary | ICD-10-CM | POA: Diagnosis not present

## 2016-11-11 DIAGNOSIS — N186 End stage renal disease: Secondary | ICD-10-CM | POA: Diagnosis not present

## 2016-11-11 DIAGNOSIS — N2581 Secondary hyperparathyroidism of renal origin: Secondary | ICD-10-CM | POA: Diagnosis not present

## 2016-11-12 DIAGNOSIS — Z992 Dependence on renal dialysis: Secondary | ICD-10-CM | POA: Diagnosis not present

## 2016-11-12 DIAGNOSIS — T8612 Kidney transplant failure: Secondary | ICD-10-CM | POA: Diagnosis not present

## 2016-11-12 DIAGNOSIS — N186 End stage renal disease: Secondary | ICD-10-CM | POA: Diagnosis not present

## 2016-11-13 DIAGNOSIS — D631 Anemia in chronic kidney disease: Secondary | ICD-10-CM | POA: Diagnosis not present

## 2016-11-13 DIAGNOSIS — N186 End stage renal disease: Secondary | ICD-10-CM | POA: Diagnosis not present

## 2016-11-13 DIAGNOSIS — N2581 Secondary hyperparathyroidism of renal origin: Secondary | ICD-10-CM | POA: Diagnosis not present

## 2016-11-16 DIAGNOSIS — N186 End stage renal disease: Secondary | ICD-10-CM | POA: Diagnosis not present

## 2016-11-16 DIAGNOSIS — N2581 Secondary hyperparathyroidism of renal origin: Secondary | ICD-10-CM | POA: Diagnosis not present

## 2016-11-16 DIAGNOSIS — D631 Anemia in chronic kidney disease: Secondary | ICD-10-CM | POA: Diagnosis not present

## 2016-11-18 DIAGNOSIS — D631 Anemia in chronic kidney disease: Secondary | ICD-10-CM | POA: Diagnosis not present

## 2016-11-18 DIAGNOSIS — N186 End stage renal disease: Secondary | ICD-10-CM | POA: Diagnosis not present

## 2016-11-18 DIAGNOSIS — N2581 Secondary hyperparathyroidism of renal origin: Secondary | ICD-10-CM | POA: Diagnosis not present

## 2016-11-20 DIAGNOSIS — N2581 Secondary hyperparathyroidism of renal origin: Secondary | ICD-10-CM | POA: Diagnosis not present

## 2016-11-20 DIAGNOSIS — D631 Anemia in chronic kidney disease: Secondary | ICD-10-CM | POA: Diagnosis not present

## 2016-11-20 DIAGNOSIS — N186 End stage renal disease: Secondary | ICD-10-CM | POA: Diagnosis not present

## 2016-11-23 DIAGNOSIS — D631 Anemia in chronic kidney disease: Secondary | ICD-10-CM | POA: Diagnosis not present

## 2016-11-23 DIAGNOSIS — N186 End stage renal disease: Secondary | ICD-10-CM | POA: Diagnosis not present

## 2016-11-23 DIAGNOSIS — N2581 Secondary hyperparathyroidism of renal origin: Secondary | ICD-10-CM | POA: Diagnosis not present

## 2016-11-25 DIAGNOSIS — N186 End stage renal disease: Secondary | ICD-10-CM | POA: Diagnosis not present

## 2016-11-25 DIAGNOSIS — N2581 Secondary hyperparathyroidism of renal origin: Secondary | ICD-10-CM | POA: Diagnosis not present

## 2016-11-25 DIAGNOSIS — D631 Anemia in chronic kidney disease: Secondary | ICD-10-CM | POA: Diagnosis not present

## 2016-11-27 DIAGNOSIS — N186 End stage renal disease: Secondary | ICD-10-CM | POA: Diagnosis not present

## 2016-11-27 DIAGNOSIS — D631 Anemia in chronic kidney disease: Secondary | ICD-10-CM | POA: Diagnosis not present

## 2016-11-27 DIAGNOSIS — N2581 Secondary hyperparathyroidism of renal origin: Secondary | ICD-10-CM | POA: Diagnosis not present

## 2016-11-30 DIAGNOSIS — N2581 Secondary hyperparathyroidism of renal origin: Secondary | ICD-10-CM | POA: Diagnosis not present

## 2016-11-30 DIAGNOSIS — N186 End stage renal disease: Secondary | ICD-10-CM | POA: Diagnosis not present

## 2016-11-30 DIAGNOSIS — D631 Anemia in chronic kidney disease: Secondary | ICD-10-CM | POA: Diagnosis not present

## 2016-12-02 DIAGNOSIS — N2581 Secondary hyperparathyroidism of renal origin: Secondary | ICD-10-CM | POA: Diagnosis not present

## 2016-12-02 DIAGNOSIS — D631 Anemia in chronic kidney disease: Secondary | ICD-10-CM | POA: Diagnosis not present

## 2016-12-02 DIAGNOSIS — N186 End stage renal disease: Secondary | ICD-10-CM | POA: Diagnosis not present

## 2016-12-04 DIAGNOSIS — D631 Anemia in chronic kidney disease: Secondary | ICD-10-CM | POA: Diagnosis not present

## 2016-12-04 DIAGNOSIS — N2581 Secondary hyperparathyroidism of renal origin: Secondary | ICD-10-CM | POA: Diagnosis not present

## 2016-12-04 DIAGNOSIS — N186 End stage renal disease: Secondary | ICD-10-CM | POA: Diagnosis not present

## 2016-12-06 DIAGNOSIS — N186 End stage renal disease: Secondary | ICD-10-CM | POA: Diagnosis not present

## 2016-12-06 DIAGNOSIS — D631 Anemia in chronic kidney disease: Secondary | ICD-10-CM | POA: Diagnosis not present

## 2016-12-06 DIAGNOSIS — N2581 Secondary hyperparathyroidism of renal origin: Secondary | ICD-10-CM | POA: Diagnosis not present

## 2016-12-09 DIAGNOSIS — N186 End stage renal disease: Secondary | ICD-10-CM | POA: Diagnosis not present

## 2016-12-09 DIAGNOSIS — N2581 Secondary hyperparathyroidism of renal origin: Secondary | ICD-10-CM | POA: Diagnosis not present

## 2016-12-09 DIAGNOSIS — D631 Anemia in chronic kidney disease: Secondary | ICD-10-CM | POA: Diagnosis not present

## 2016-12-11 DIAGNOSIS — D631 Anemia in chronic kidney disease: Secondary | ICD-10-CM | POA: Diagnosis not present

## 2016-12-11 DIAGNOSIS — N2581 Secondary hyperparathyroidism of renal origin: Secondary | ICD-10-CM | POA: Diagnosis not present

## 2016-12-11 DIAGNOSIS — N186 End stage renal disease: Secondary | ICD-10-CM | POA: Diagnosis not present

## 2016-12-13 DIAGNOSIS — N2581 Secondary hyperparathyroidism of renal origin: Secondary | ICD-10-CM | POA: Diagnosis not present

## 2016-12-13 DIAGNOSIS — N186 End stage renal disease: Secondary | ICD-10-CM | POA: Diagnosis not present

## 2016-12-13 DIAGNOSIS — D631 Anemia in chronic kidney disease: Secondary | ICD-10-CM | POA: Diagnosis not present

## 2016-12-13 DIAGNOSIS — T8612 Kidney transplant failure: Secondary | ICD-10-CM | POA: Diagnosis not present

## 2016-12-13 DIAGNOSIS — Z992 Dependence on renal dialysis: Secondary | ICD-10-CM | POA: Diagnosis not present

## 2016-12-16 DIAGNOSIS — N186 End stage renal disease: Secondary | ICD-10-CM | POA: Diagnosis not present

## 2016-12-16 DIAGNOSIS — N2581 Secondary hyperparathyroidism of renal origin: Secondary | ICD-10-CM | POA: Diagnosis not present

## 2016-12-16 DIAGNOSIS — D631 Anemia in chronic kidney disease: Secondary | ICD-10-CM | POA: Diagnosis not present

## 2016-12-18 DIAGNOSIS — N2581 Secondary hyperparathyroidism of renal origin: Secondary | ICD-10-CM | POA: Diagnosis not present

## 2016-12-18 DIAGNOSIS — D631 Anemia in chronic kidney disease: Secondary | ICD-10-CM | POA: Diagnosis not present

## 2016-12-18 DIAGNOSIS — N186 End stage renal disease: Secondary | ICD-10-CM | POA: Diagnosis not present

## 2016-12-21 ENCOUNTER — Encounter (HOSPITAL_COMMUNITY): Payer: Self-pay | Admitting: *Deleted

## 2016-12-21 ENCOUNTER — Inpatient Hospital Stay (HOSPITAL_COMMUNITY)
Admission: EM | Admit: 2016-12-21 | Discharge: 2016-12-26 | DRG: 252 | Disposition: A | Discharge status: Home / Self Care | Payer: Medicare Other | Attending: Vascular Surgery | Admitting: Vascular Surgery

## 2016-12-21 DIAGNOSIS — Z882 Allergy status to sulfonamides status: Secondary | ICD-10-CM | POA: Diagnosis not present

## 2016-12-21 DIAGNOSIS — Z940 Kidney transplant status: Secondary | ICD-10-CM

## 2016-12-21 DIAGNOSIS — E440 Moderate protein-calorie malnutrition: Secondary | ICD-10-CM | POA: Insufficient documentation

## 2016-12-21 DIAGNOSIS — J449 Chronic obstructive pulmonary disease, unspecified: Secondary | ICD-10-CM | POA: Diagnosis present

## 2016-12-21 DIAGNOSIS — T82898A Other specified complication of vascular prosthetic devices, implants and grafts, initial encounter: Secondary | ICD-10-CM | POA: Diagnosis present

## 2016-12-21 DIAGNOSIS — Z6822 Body mass index (BMI) 22.0-22.9, adult: Secondary | ICD-10-CM | POA: Diagnosis not present

## 2016-12-21 DIAGNOSIS — L98491 Non-pressure chronic ulcer of skin of other sites limited to breakdown of skin: Secondary | ICD-10-CM | POA: Diagnosis present

## 2016-12-21 DIAGNOSIS — I120 Hypertensive chronic kidney disease with stage 5 chronic kidney disease or end stage renal disease: Secondary | ICD-10-CM | POA: Diagnosis not present

## 2016-12-21 DIAGNOSIS — E8889 Other specified metabolic disorders: Secondary | ICD-10-CM | POA: Diagnosis not present

## 2016-12-21 DIAGNOSIS — Z87891 Personal history of nicotine dependence: Secondary | ICD-10-CM

## 2016-12-21 DIAGNOSIS — Z79899 Other long term (current) drug therapy: Secondary | ICD-10-CM

## 2016-12-21 DIAGNOSIS — R51 Headache: Secondary | ICD-10-CM | POA: Diagnosis not present

## 2016-12-21 DIAGNOSIS — E785 Hyperlipidemia, unspecified: Secondary | ICD-10-CM | POA: Diagnosis present

## 2016-12-21 DIAGNOSIS — I509 Heart failure, unspecified: Secondary | ICD-10-CM | POA: Diagnosis present

## 2016-12-21 DIAGNOSIS — M810 Age-related osteoporosis without current pathological fracture: Secondary | ICD-10-CM | POA: Diagnosis present

## 2016-12-21 DIAGNOSIS — Z992 Dependence on renal dialysis: Secondary | ICD-10-CM

## 2016-12-21 DIAGNOSIS — N186 End stage renal disease: Secondary | ICD-10-CM | POA: Diagnosis present

## 2016-12-21 DIAGNOSIS — Z452 Encounter for adjustment and management of vascular access device: Secondary | ICD-10-CM | POA: Diagnosis not present

## 2016-12-21 DIAGNOSIS — T82838A Hemorrhage of vascular prosthetic devices, implants and grafts, initial encounter: Secondary | ICD-10-CM | POA: Diagnosis not present

## 2016-12-21 DIAGNOSIS — N2581 Secondary hyperparathyroidism of renal origin: Secondary | ICD-10-CM | POA: Diagnosis not present

## 2016-12-21 DIAGNOSIS — Z8673 Personal history of transient ischemic attack (TIA), and cerebral infarction without residual deficits: Secondary | ICD-10-CM

## 2016-12-21 DIAGNOSIS — Z7982 Long term (current) use of aspirin: Secondary | ICD-10-CM | POA: Diagnosis not present

## 2016-12-21 DIAGNOSIS — K5900 Constipation, unspecified: Secondary | ICD-10-CM | POA: Diagnosis not present

## 2016-12-21 DIAGNOSIS — F418 Other specified anxiety disorders: Secondary | ICD-10-CM | POA: Diagnosis present

## 2016-12-21 DIAGNOSIS — I132 Hypertensive heart and chronic kidney disease with heart failure and with stage 5 chronic kidney disease, or end stage renal disease: Secondary | ICD-10-CM | POA: Diagnosis present

## 2016-12-21 DIAGNOSIS — D631 Anemia in chronic kidney disease: Secondary | ICD-10-CM | POA: Diagnosis not present

## 2016-12-21 DIAGNOSIS — Z8249 Family history of ischemic heart disease and other diseases of the circulatory system: Secondary | ICD-10-CM | POA: Diagnosis not present

## 2016-12-21 DIAGNOSIS — I517 Cardiomegaly: Secondary | ICD-10-CM | POA: Diagnosis not present

## 2016-12-21 DIAGNOSIS — Z419 Encounter for procedure for purposes other than remedying health state, unspecified: Secondary | ICD-10-CM

## 2016-12-21 LAB — CBC
HEMATOCRIT: 28.8 % — AB (ref 36.0–46.0)
Hemoglobin: 9.1 g/dL — ABNORMAL LOW (ref 12.0–15.0)
MCH: 28.8 pg (ref 26.0–34.0)
MCHC: 31.6 g/dL (ref 30.0–36.0)
MCV: 91.1 fL (ref 78.0–100.0)
Platelets: 280 10*3/uL (ref 150–400)
RBC: 3.16 MIL/uL — ABNORMAL LOW (ref 3.87–5.11)
RDW: 18.2 % — ABNORMAL HIGH (ref 11.5–15.5)
WBC: 8.2 10*3/uL (ref 4.0–10.5)

## 2016-12-21 LAB — BASIC METABOLIC PANEL
ANION GAP: 19 — AB (ref 5–15)
BUN: 73 mg/dL — ABNORMAL HIGH (ref 6–20)
CO2: 26 mmol/L (ref 22–32)
Calcium: 9.2 mg/dL (ref 8.9–10.3)
Chloride: 93 mmol/L — ABNORMAL LOW (ref 101–111)
Creatinine, Ser: 9.73 mg/dL — ABNORMAL HIGH (ref 0.44–1.00)
GFR calc Af Amer: 4 mL/min — ABNORMAL LOW (ref >60)
GFR calc non Af Amer: 4 mL/min — ABNORMAL LOW (ref >60)
GLUCOSE: 219 mg/dL — AB (ref 65–99)
POTASSIUM: 5.2 mmol/L — AB (ref 3.5–5.1)
Sodium: 138 mmol/L (ref 135–145)

## 2016-12-21 IMAGING — CR DG CHEST 2V
2 series · 2 of 2 positions shown · non-contrast
Comparison: PA and lateral chest 12/10/2015 and 05/02/2014. CT
chest 05/02/2014.

CLINICAL DATA: Chest pain, soreness and flushing beginning
yesterday. Initial encounter.

EXAM:
CHEST  2 VIEW

[chest pa]
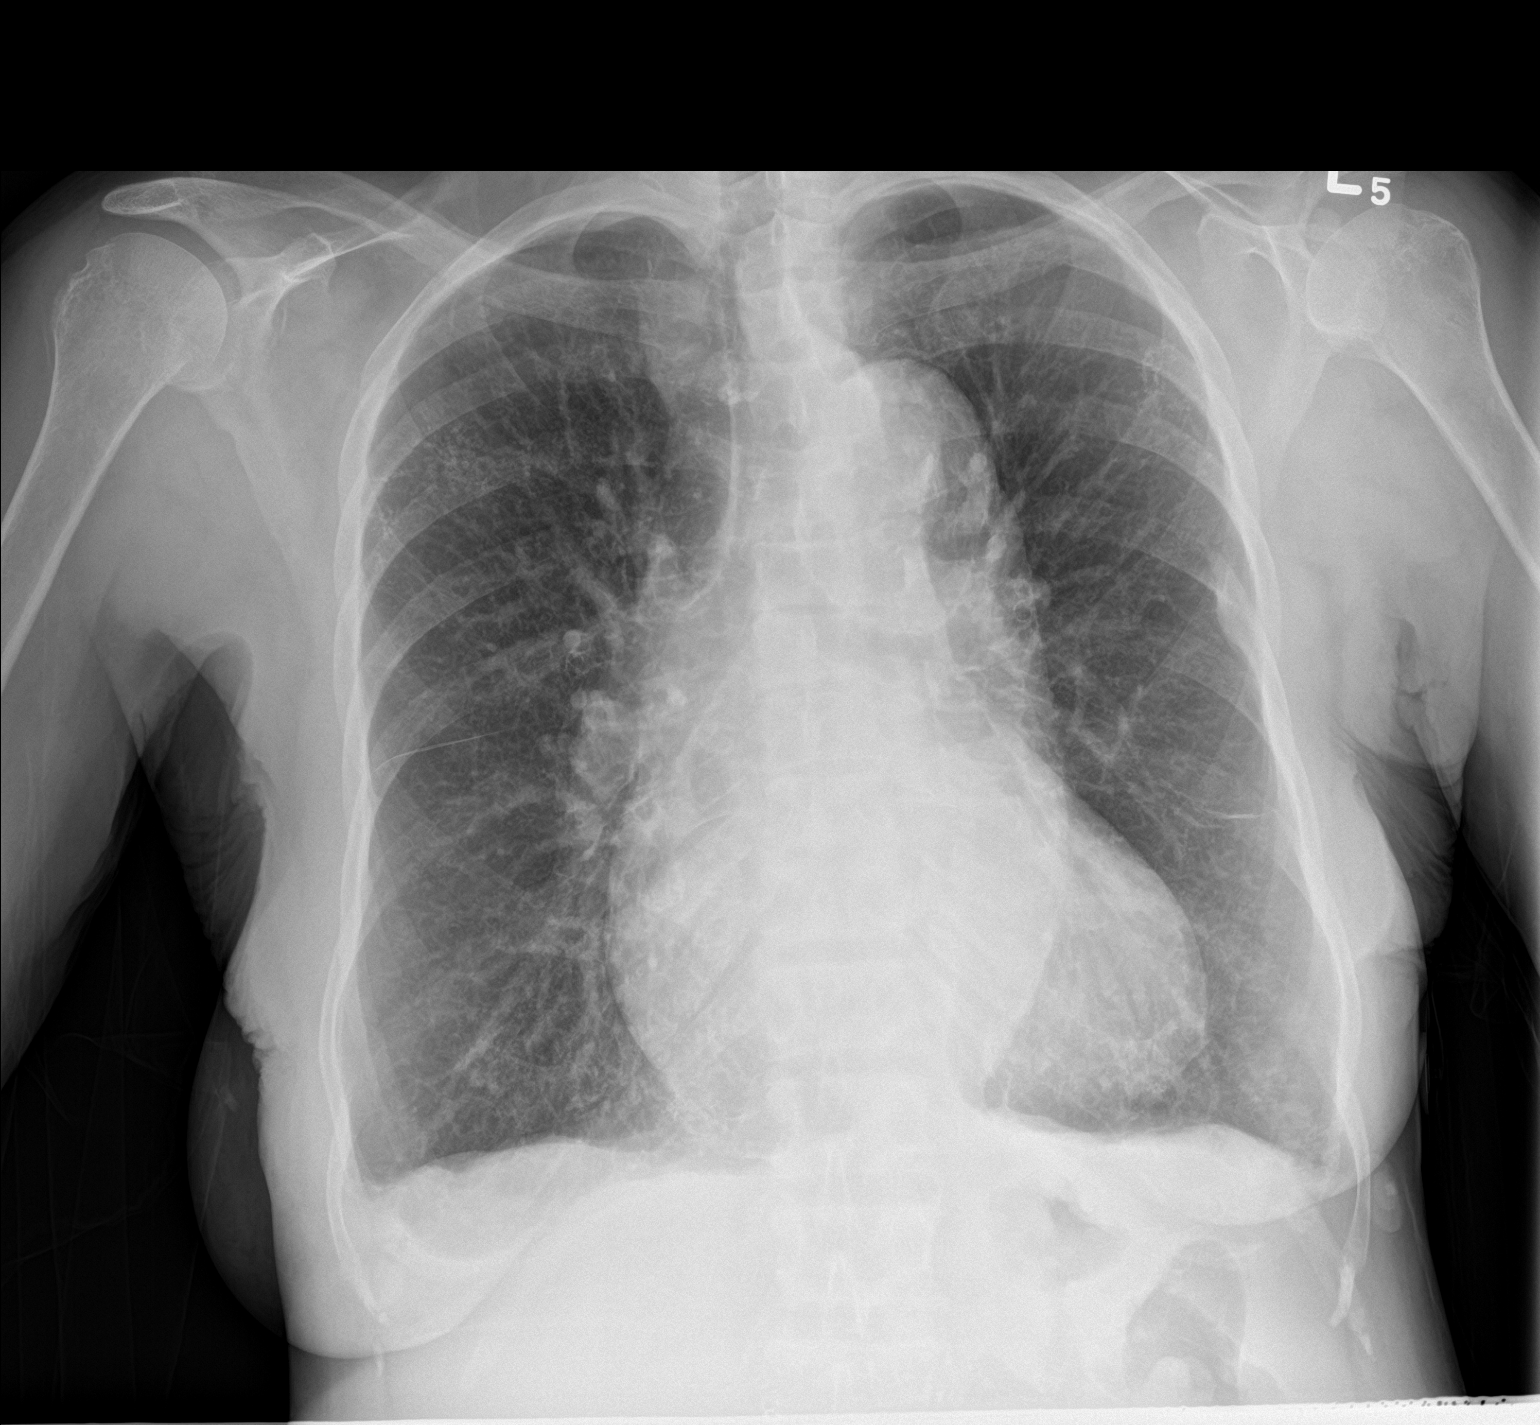

[chest lat]
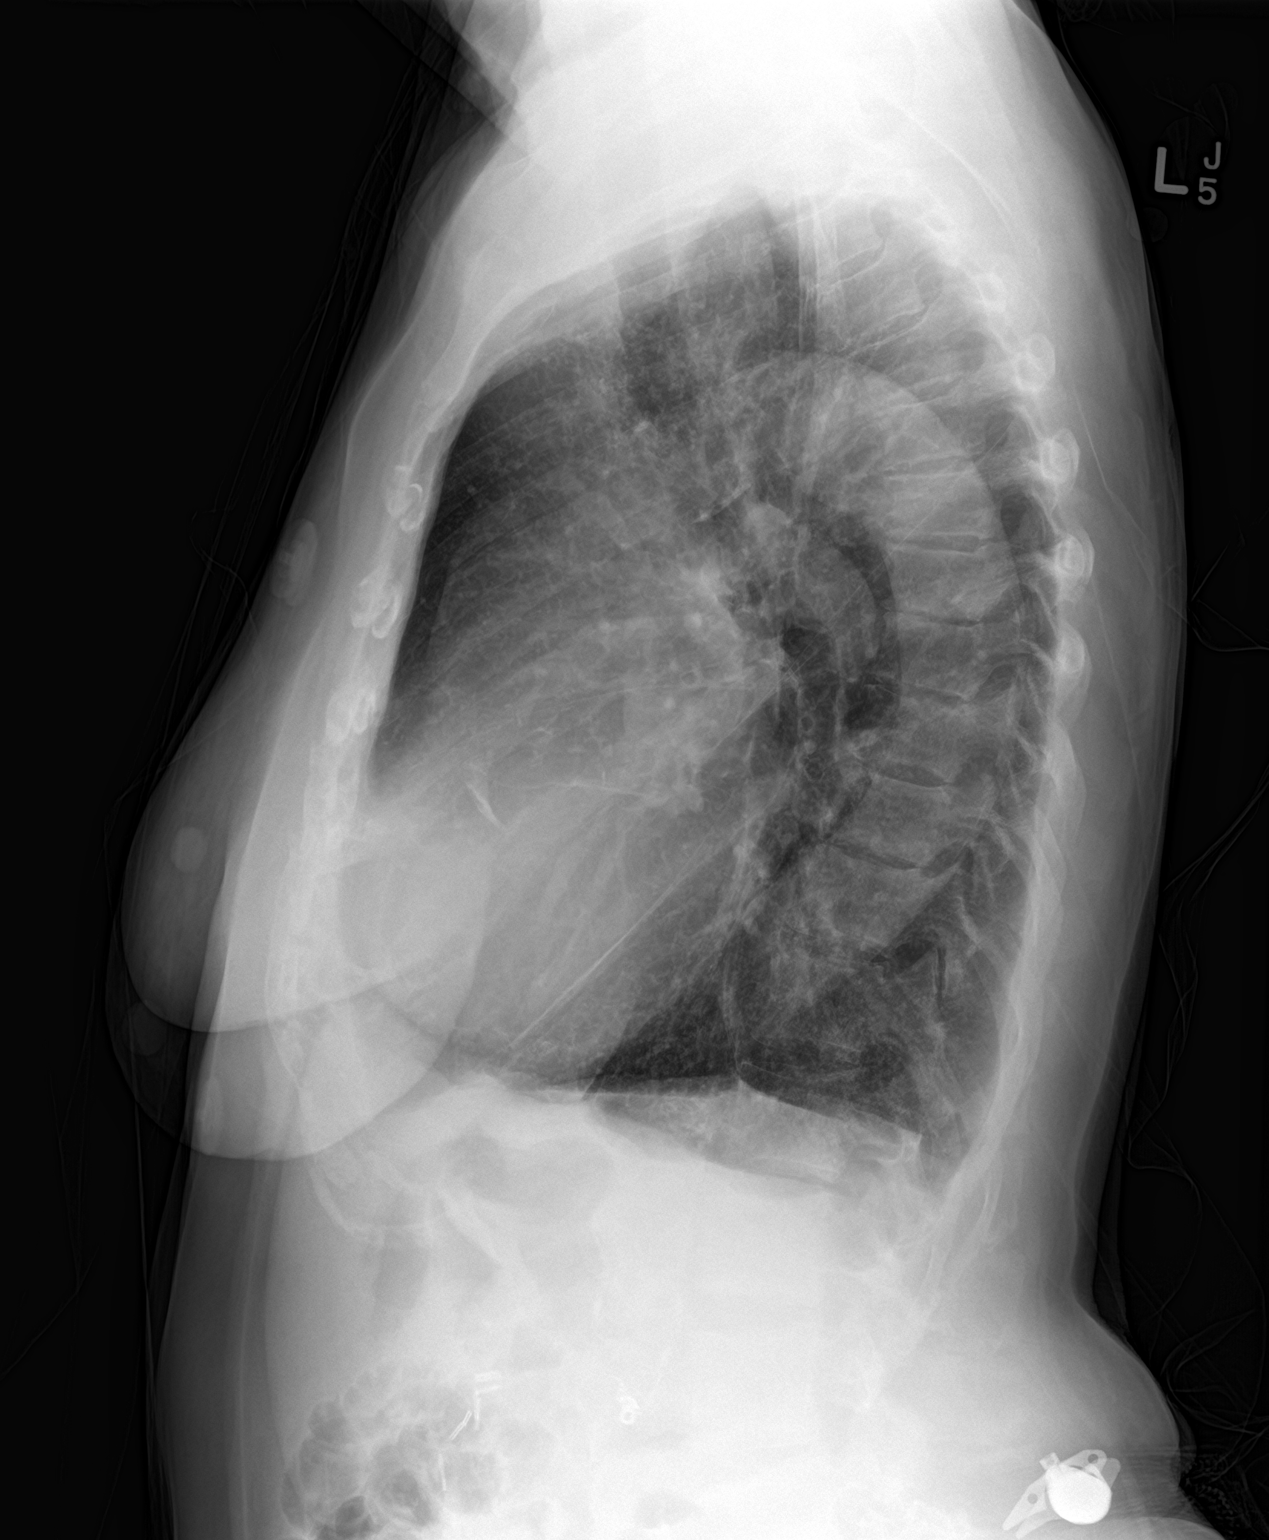

[2 of 2 positions shown; findings below may reference images not displayed]

FINDINGS: There is cardiomegaly without edema. Chronic micronodularity in the
upper lobes is consistent with remote infectious or inflammatory
process and unchanged. The lungs are otherwise clear. No
pneumothorax or pleural effusion.
IMPRESSION: Cardiomegaly without acute disease.

## 2016-12-21 MED ORDER — PENTAFLUOROPROP-TETRAFLUOROETH EX AERO: 1.0000 "application " | INHALATION_SPRAY | CUTANEOUS | Status: DC | PRN

## 2016-12-21 MED ORDER — SODIUM CHLORIDE 0.9% FLUSH: 3.0000 mL | INTRAVENOUS | Status: DC | PRN

## 2016-12-21 MED ORDER — HYDRALAZINE HCL 20 MG/ML IJ SOLN: 5.0000 mg | INTRAMUSCULAR | Status: DC | PRN

## 2016-12-21 MED ORDER — CALCITRIOL 0.5 MCG PO CAPS
2.2500 ug | ORAL_CAPSULE | ORAL | Status: DC | PRN
  Administered 2016-12-22 – 2016-12-24 (×2): 2.25 ug via ORAL

## 2016-12-21 MED ORDER — PANTOPRAZOLE SODIUM 40 MG PO TBEC
40.0000 mg | DELAYED_RELEASE_TABLET | Freq: Every day | ORAL | Status: DC
  Administered 2016-12-23 – 2016-12-26 (×3): 40 mg via ORAL
  Filled 2016-12-21 (×3): qty 1

## 2016-12-21 MED ORDER — GUAIFENESIN-DM 100-10 MG/5ML PO SYRP: 15.0000 mL | ORAL_SOLUTION | ORAL | Status: DC | PRN

## 2016-12-21 MED ORDER — LABETALOL HCL 5 MG/ML IV SOLN: 10.0000 mg | INTRAVENOUS | Status: DC | PRN

## 2016-12-21 MED ORDER — ZOLPIDEM TARTRATE 5 MG PO TABS
5.0000 mg | ORAL_TABLET | Freq: Every evening | ORAL | Status: DC | PRN
  Administered 2016-12-21 – 2016-12-25 (×4): 5 mg via ORAL
  Filled 2016-12-21 (×4): qty 1

## 2016-12-21 MED ORDER — LACTULOSE 10 GM/15ML PO SOLN: 20.0000 g | Freq: Every day | ORAL | Status: DC | PRN

## 2016-12-21 MED ORDER — SODIUM CHLORIDE 0.9 % IV SOLN: 250.0000 mL | INTRAVENOUS | Status: DC | PRN

## 2016-12-21 MED ORDER — LIDOCAINE-PRILOCAINE 2.5-2.5 % EX CREA: 1.0000 "application " | TOPICAL_CREAM | CUTANEOUS | Status: DC | PRN

## 2016-12-21 MED ORDER — ONDANSETRON HCL 4 MG/2ML IJ SOLN
4.0000 mg | Freq: Four times a day (QID) | INTRAMUSCULAR | Status: DC | PRN
  Administered 2016-12-23: 4 mg via INTRAVENOUS
  Filled 2016-12-21 (×2): qty 2

## 2016-12-21 MED ORDER — PROMETHAZINE HCL 25 MG PO TABS: 25.0000 mg | ORAL_TABLET | Freq: Four times a day (QID) | ORAL | Status: DC | PRN

## 2016-12-21 MED ORDER — SODIUM CHLORIDE 0.9% FLUSH
3.0000 mL | Freq: Two times a day (BID) | INTRAVENOUS | Status: DC
  Administered 2016-12-22 – 2016-12-25 (×5): 3 mL via INTRAVENOUS

## 2016-12-21 MED ORDER — PHENOL 1.4 % MT LIQD: 1.0000 | OROMUCOSAL | Status: DC | PRN

## 2016-12-21 MED ORDER — ALPRAZOLAM 0.25 MG PO TABS
0.2500 mg | ORAL_TABLET | Freq: Two times a day (BID) | ORAL | Status: DC | PRN
  Administered 2016-12-23: 0.25 mg via ORAL
  Filled 2016-12-21: qty 1

## 2016-12-21 MED ORDER — HEPARIN SODIUM (PORCINE) 1000 UNIT/ML DIALYSIS: 1000.0000 [IU] | INTRAMUSCULAR | Status: DC | PRN

## 2016-12-21 MED ORDER — LOSARTAN POTASSIUM 50 MG PO TABS
100.0000 mg | ORAL_TABLET | Freq: Every day | ORAL | Status: DC
  Administered 2016-12-21 – 2016-12-25 (×5): 100 mg via ORAL
  Filled 2016-12-21 (×6): qty 2

## 2016-12-21 MED ORDER — DEXTROSE 5 % IV SOLN
1.5000 g | INTRAVENOUS | Status: AC
  Administered 2016-12-22: 1.5 g via INTRAVENOUS
  Filled 2016-12-21 (×2): qty 1.5

## 2016-12-21 MED ORDER — ASPIRIN EC 325 MG PO TBEC
325.0000 mg | DELAYED_RELEASE_TABLET | Freq: Every day | ORAL | Status: DC
  Administered 2016-12-21 – 2016-12-25 (×5): 325 mg via ORAL
  Filled 2016-12-21 (×5): qty 1

## 2016-12-21 MED ORDER — SODIUM CHLORIDE 0.9 % IV SOLN: 100.0000 mL | INTRAVENOUS | Status: DC | PRN

## 2016-12-21 MED ORDER — POTASSIUM CHLORIDE CRYS ER 20 MEQ PO TBCR: 20.0000 meq | EXTENDED_RELEASE_TABLET | Freq: Once | ORAL | Status: DC

## 2016-12-21 MED ORDER — ACETAMINOPHEN 325 MG PO TABS: 650.0000 mg | ORAL_TABLET | Freq: Four times a day (QID) | ORAL | Status: DC | PRN

## 2016-12-21 MED ORDER — LABETALOL HCL 100 MG PO TABS
100.0000 mg | ORAL_TABLET | Freq: Two times a day (BID) | ORAL | Status: DC
  Administered 2016-12-21 – 2016-12-26 (×9): 100 mg via ORAL
  Filled 2016-12-21 (×9): qty 1

## 2016-12-21 MED ORDER — LIDOCAINE HCL (PF) 1 % IJ SOLN: 5.0000 mL | INTRAMUSCULAR | Status: DC | PRN

## 2016-12-21 MED ORDER — ALUM & MAG HYDROXIDE-SIMETH 200-200-20 MG/5ML PO SUSP: 15.0000 mL | ORAL | Status: DC | PRN

## 2016-12-21 MED ORDER — ALLOPURINOL 300 MG PO TABS
150.0000 mg | ORAL_TABLET | Freq: Every day | ORAL | Status: DC
  Administered 2016-12-21 – 2016-12-25 (×5): 150 mg via ORAL
  Filled 2016-12-21 (×5): qty 1

## 2016-12-21 MED ORDER — DOXAZOSIN MESYLATE 2 MG PO TABS
8.0000 mg | ORAL_TABLET | Freq: Every day | ORAL | Status: DC
  Administered 2016-12-21 – 2016-12-25 (×5): 8 mg via ORAL
  Filled 2016-12-21 (×5): qty 4

## 2016-12-21 MED ORDER — ALTEPLASE 2 MG IJ SOLR: 2.0000 mg | Freq: Once | INTRAMUSCULAR | Status: DC | PRN

## 2016-12-21 MED ORDER — METOPROLOL TARTRATE 5 MG/5ML IV SOLN: 2.0000 mg | INTRAVENOUS | Status: DC | PRN

## 2016-12-21 MED ORDER — ACETAMINOPHEN-CODEINE #3 300-30 MG PO TABS
1.0000 | ORAL_TABLET | Freq: Four times a day (QID) | ORAL | Status: DC | PRN
  Administered 2016-12-22 – 2016-12-24 (×4): 2 via ORAL
  Filled 2016-12-21 (×4): qty 2

## 2016-12-21 MED ORDER — SIMVASTATIN 10 MG PO TABS
10.0000 mg | ORAL_TABLET | Freq: Every day | ORAL | Status: DC
  Administered 2016-12-21 – 2016-12-25 (×5): 10 mg via ORAL
  Filled 2016-12-21 (×5): qty 1

## 2016-12-21 MED ORDER — MORPHINE SULFATE (PF) 2 MG/ML IV SOLN
1.0000 mg | INTRAVENOUS | Status: DC | PRN
  Filled 2016-12-21: qty 1

## 2016-12-21 MED ORDER — MECLIZINE HCL 25 MG PO TABS: 25.0000 mg | ORAL_TABLET | Freq: Two times a day (BID) | ORAL | Status: DC | PRN

## 2016-12-21 MED ORDER — CINACALCET HCL 30 MG PO TABS
30.0000 mg | ORAL_TABLET | Freq: Every day | ORAL | Status: DC
  Administered 2016-12-21 – 2016-12-24 (×4): 30 mg via ORAL
  Filled 2016-12-21 (×4): qty 1

## 2016-12-21 NOTE — H&P (Signed)
[facility] [place]  Patient name: [name] MRN: [id number] DOB: 26-Jul-1951 Sex: female  REASON FOR CONSULT: evaluate fistula , consult is from ED  HPI: [name] is a 66 y.o. female with ESRD on HD (MWF), who presents with 4 week history of prolonged bleeding from her left upper arm fistula. She is sent here from her dialysis center. She states that she has issues with bleeding after each dialysis session, including one episode where "I lost a lot of blood." She did not have dialysis today due to concerns of redness over her fistula. She was last seen by Dr. 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 This revealed a tortuous but widely patent fistula. No plans for surgical intervention unless the patient had worsening of her aneurysmal degeneration or if she developed an eschar over her fistula. Her fistula was originally placed in December 2016 by Dr. Trula Slade.   She takes a daily aspirin. She is not on any blood thinners. Her past medical history includes CHF, hypertension and hyperlipidemia all of which are stable.   Past Medical History:  Diagnosis Date  . Adenomatous polyp of colon 10/2010, 2006, 2015  . Anemia in CKD (chronic kidney disease) 11/07/2012   s/p blood transfusion.   . Arthritis   . CHF (congestive heart failure) ([place])   . Constipation   . Depression with anxiety   . Diverticula, colon   . 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  Had deceased donor kidney transplant in 1996.  Had some early rejection then stable function for years, then had slow decline of function and went back on hemodialysis in 2012.  Gets HD TTS schedule at [facility] still using L forearm AVF.     Marland Kitchen GERD (gastroesophageal reflux disease)   . Headache   . Hyperlipidemia   . Hypertension   .  Neuromuscular disorder (HCC)    neuropathy hand and legs  . Osteoporosis   . Pneumonia   . Stroke [facility]) 11/2015   TIA  . Weight loss, unintentional     Family History  Problem Relation Age of Onset  . Colon cancer Brother   . Cancer Brother   . Coronary artery disease Mother 62  . Hyperlipidemia Mother   . Hypertension Mother   . Esophageal cancer Neg Hx   . Stomach cancer Neg Hx   . Rectal cancer Neg Hx     SOCIAL HISTORY: Social History   Social History  . Marital status: Widowed    Spouse name: N/A  . Number of children: 2  . Years of education: N/A   Occupational History  . Not on file.   Social History Main Topics  . Smoking status: Former Smoker    Types: Cigarettes    Quit date: 12/31/1991  . Smokeless tobacco: Never Used  . Alcohol use No  . Drug use: No  . Sexual activity: No   Other Topics Concern  . Not on file   Social History Narrative  . No narrative on file    Allergies  Allergen Reactions  . Sulfa Antibiotics Other (See Comments)    Both parents allergic-so will not take    No current facility-administered medications for this encounter.    Current Outpatient Prescriptions  Medication Sig Dispense Refill  . acetaminophen (TYLENOL) 325 MG  tablet Take 650 mg by mouth every 6 (six) hours as needed for mild pain.    Marland Kitchen acetaminophen-codeine (TYLENOL #3) 300-30 MG tablet Take 1-2 tablets by mouth every 6 (six) hours as needed for moderate pain. 15 tablet 0  . allopurinol (ZYLOPRIM) 300 MG tablet Take 150 mg by mouth at bedtime.    . ALPRAZolam (XANAX) 0.25 MG tablet Take 0.25 mg by mouth 2 (two) times daily as needed for anxiety.     Marland Kitchen aspirin EC 325 MG tablet Take 325 mg by mouth at bedtime.    . B Complex-C-Folic Acid (DIALYVITE PO) Take 1 tablet by mouth daily.    Marland Kitchen doxazosin (CARDURA) 8 MG tablet Take 1 tablet (8 mg total) by mouth 2 (two) times daily as needed (hypertension). (Patient taking differently: Take 8 mg by mouth at bedtime. )     . labetalol (NORMODYNE) 100 MG tablet Take 100 mg by mouth 2 (two) times daily.    Marland Kitchen lactulose (CHRONULAC) 10 GM/15ML solution Take 20 g by mouth daily as needed for mild constipation.    Marland Kitchen losartan (COZAAR) 100 MG tablet Take 100 mg by mouth at bedtime.     . meclizine (ANTIVERT) 25 MG tablet Take 1 tablet (25 mg total) by mouth 2 (two) times daily as needed for dizziness. 30 tablet 0  . promethazine (PHENERGAN) 25 MG tablet Take 25 mg by mouth every 6 (six) hours as needed for nausea or vomiting.    . SENSIPAR 30 MG tablet Take 30 mg by mouth at bedtime.    . simvastatin (ZOCOR) 10 MG tablet Take 10 mg by mouth at bedtime.     Marland Kitchen zolpidem (AMBIEN) 10 MG tablet Take 10 mg by mouth at bedtime as needed for sleep.       REVIEW OF SYSTEMS:  [X]  denotes positive finding, [ ]  denotes negative finding Cardiac  Comments:  Chest pain or chest pressure:    Shortness of breath upon exertion: x   Short of breath when lying flat:    Irregular heart rhythm:        Vascular    Pain in calf, thigh, or hip brought on by ambulation:    Pain in feet at night that wakes you up from your sleep:     Blood clot in your veins:    Leg swelling:         Pulmonary    Oxygen at home:    Productive cough:     Wheezing:         Neurologic    Sudden weakness in arms or legs:     Sudden numbness in arms or legs:     Sudden onset of difficulty speaking or slurred speech:    Temporary loss of vision in one eye:     Problems with dizziness:         Gastrointestinal    Blood in stool:     Vomited blood:         Genitourinary    Burning when urinating:     Blood in urine:        Psychiatric    Major depression:         Hematologic    Bleeding problems:    Problems with blood clotting too easily:        Skin    Rashes or ulcers: x Ulcer left upper arm fistula       Constitutional    Fever or chills:  PHYSICAL EXAM: Vitals:   12/21/16 1521 12/21/16 1523 12/21/16 1714  BP: (!) 194/118   (!) 170/101  Pulse: 93  91  Resp: 16  14  Temp: 98.4 F (36.9 C)  97.8 F (36.6 C)  TempSrc: Oral  Oral  SpO2: 94%  94%  Weight:  132 lb (59.9 kg)   Height:  5' 2.5" (1.588 m)     GENERAL: The patient is a well-nourished female, in no acute distress. The vital signs are documented above. CARDIAC: There is a regular rate and rhythm. No carotid bruits. VASCULAR: Diffusely aneurysmal left upper arm fistula with ulceration distally. No bleeding currently. Easily palpable thrill. 2+ radial pulses bilaterally.  PULMONARY: There is good air exchange bilaterally without wheezing or rales. MUSCULOSKELETAL: There are no major deformities or cyanosis. NEUROLOGIC: No focal deficits.  SKIN: See above.  PSYCHIATRIC: The patient has a normal affect.   MEDICAL ISSUES: Ulceration left upper arm fistula with multiple bleeding episodes.   The patient has a diffusely aneurysmal fistula that has now developed an ulceration. She has had prolonged bleeding episodes after each dialysis session for the past month, including an episode of heavy bleeding. She recently ate a meal today while waiting in the ER. Will admit tonight given risk of bleeding from fistula. If bleeding occurs, advised patient to contact nurse and educated patient on how to compress fistula distally.  The patient did not have dialysis today. Will check potassium and see if patient needs dialysis today. Plan for revision of left upper arm fistula with possible ligation of fistula and possible insertion of tunneled dialysis catheter tomorrow with Dr. Donzetta Matters. NPO past midnight. Obtain consent.   Virgina Jock, PA-C [facility] [phone]  History and details as above.  Fistula erosion at high risk for bleeding.  Will revise in OR tomorrow. Will leave at discretion of Dr Donzetta Matters whether she needs ligation or catheter.  Renal notified of admission in case she needs dialysis since she missed it today  Ruta Hinds,  MD [facility]: [phone] Pager: [phone]

## 2016-12-21 NOTE — ED Notes (Signed)
Delay explained and pt brought back to triage to be seen by Dr. Oneida Alar and his PA

## 2016-12-21 NOTE — ED Triage Notes (Signed)
PT had a recent surgery on left upper arm fistula by Dr. Oneida Alar.  Pt went to dialysis today and they would not do HD on her because the site is too red.  Sent here to r/o infection.

## 2016-12-22 ENCOUNTER — Encounter (HOSPITAL_COMMUNITY)
Admission: EM | Disposition: A | Discharge status: Home / Self Care | Payer: Self-pay | Source: Home / Self Care | Attending: Vascular Surgery

## 2016-12-22 ENCOUNTER — Inpatient Hospital Stay: Admit: 2016-12-22 | Payer: Medicare Other | Admitting: Vascular Surgery

## 2016-12-22 ENCOUNTER — Inpatient Hospital Stay (HOSPITAL_COMMUNITY): Payer: Medicare Other | Admitting: Certified Registered"

## 2016-12-22 ENCOUNTER — Encounter (HOSPITAL_COMMUNITY): Payer: Self-pay | Admitting: Certified Registered"

## 2016-12-22 HISTORY — PX: REVISON OF ARTERIOVENOUS FISTULA: SHX6074

## 2016-12-22 LAB — CBC
HCT: 25.1 % — ABNORMAL LOW (ref 36.0–46.0)
Hemoglobin: 8.1 g/dL — ABNORMAL LOW (ref 12.0–15.0)
MCH: 29 pg (ref 26.0–34.0)
MCHC: 32.3 g/dL (ref 30.0–36.0)
MCV: 90 fL (ref 78.0–100.0)
Platelets: 251 10*3/uL (ref 150–400)
RBC: 2.79 MIL/uL — AB (ref 3.87–5.11)
RDW: 18 % — ABNORMAL HIGH (ref 11.5–15.5)
WBC: 6.7 10*3/uL (ref 4.0–10.5)

## 2016-12-22 LAB — BASIC METABOLIC PANEL
ANION GAP: 18 — AB (ref 5–15)
BUN: 82 mg/dL — ABNORMAL HIGH (ref 6–20)
CHLORIDE: 93 mmol/L — AB (ref 101–111)
CO2: 25 mmol/L (ref 22–32)
CREATININE: 10.39 mg/dL — AB (ref 0.44–1.00)
Calcium: 8.6 mg/dL — ABNORMAL LOW (ref 8.9–10.3)
GFR calc non Af Amer: 3 mL/min — ABNORMAL LOW (ref >60)
GFR, EST AFRICAN AMERICAN: 4 mL/min — AB (ref >60)
Glucose, Bld: 104 mg/dL — ABNORMAL HIGH (ref 65–99)
POTASSIUM: 5.1 mmol/L (ref 3.5–5.1)
Sodium: 136 mmol/L (ref 135–145)

## 2016-12-22 LAB — MRSA PCR SCREENING: MRSA by PCR: NEGATIVE

## 2016-12-22 SURGERY — REVISON OF ARTERIOVENOUS FISTULA
Anesthesia: General | Laterality: Left

## 2016-12-22 MED ORDER — MIDAZOLAM HCL 2 MG/2ML IJ SOLN
INTRAMUSCULAR | Status: AC
  Filled 2016-12-22: qty 2

## 2016-12-22 MED ORDER — CALCITRIOL 0.25 MCG PO CAPS
ORAL_CAPSULE | ORAL | Status: AC
  Filled 2016-12-22: qty 1

## 2016-12-22 MED ORDER — SODIUM CHLORIDE 0.9 % IV SOLN
INTRAVENOUS | Status: DC | PRN
  Administered 2016-12-22: 10:00:00

## 2016-12-22 MED ORDER — LIDOCAINE 2% (20 MG/ML) 5 ML SYRINGE
INTRAMUSCULAR | Status: DC | PRN
  Administered 2016-12-22: 20 mg via INTRAVENOUS

## 2016-12-22 MED ORDER — OXYCODONE-ACETAMINOPHEN 5-325 MG PO TABS: 1.0000 | ORAL_TABLET | Freq: Four times a day (QID) | ORAL | 0 refills | Status: DC | PRN

## 2016-12-22 MED ORDER — CALCITRIOL 0.5 MCG PO CAPS
ORAL_CAPSULE | ORAL | Status: AC
  Filled 2016-12-22: qty 4

## 2016-12-22 MED ORDER — SODIUM CHLORIDE 0.9 % IV SOLN
62.5000 mg | Freq: Once | INTRAVENOUS | Status: AC
  Administered 2016-12-22: 62.5 mg via INTRAVENOUS
  Filled 2016-12-22: qty 5

## 2016-12-22 MED ORDER — MIDAZOLAM HCL 5 MG/5ML IJ SOLN
INTRAMUSCULAR | Status: DC | PRN
  Administered 2016-12-22: 1 mg via INTRAVENOUS

## 2016-12-22 MED ORDER — PROPOFOL 10 MG/ML IV BOLUS
INTRAVENOUS | Status: AC
  Filled 2016-12-22: qty 20

## 2016-12-22 MED ORDER — MIDAZOLAM HCL 2 MG/2ML IJ SOLN: 0.5000 mg | Freq: Once | INTRAMUSCULAR | Status: DC | PRN

## 2016-12-22 MED ORDER — MORPHINE SULFATE (PF) 4 MG/ML IV SOLN
INTRAVENOUS | Status: AC
Start: 2016-12-22 — End: 2016-12-22
  Administered 2016-12-22: 1 mg via INTRAVENOUS_CENTRAL
  Filled 2016-12-22: qty 1

## 2016-12-22 MED ORDER — PROMETHAZINE HCL 25 MG/ML IJ SOLN: 6.2500 mg | INTRAMUSCULAR | Status: DC | PRN

## 2016-12-22 MED ORDER — FENTANYL CITRATE (PF) 100 MCG/2ML IJ SOLN
INTRAMUSCULAR | Status: AC
  Filled 2016-12-22: qty 2

## 2016-12-22 MED ORDER — LOPERAMIDE HCL 2 MG PO CAPS
ORAL_CAPSULE | ORAL | Status: AC
  Filled 2016-12-22: qty 1

## 2016-12-22 MED ORDER — PROPOFOL 10 MG/ML IV BOLUS
INTRAVENOUS | Status: DC | PRN
Start: 2016-12-22 — End: 2016-12-22
  Administered 2016-12-22: 30 mg via INTRAVENOUS
  Administered 2016-12-22: 120 mg via INTRAVENOUS
  Administered 2016-12-22: 20 mg via INTRAVENOUS

## 2016-12-22 MED ORDER — FENTANYL CITRATE (PF) 100 MCG/2ML IJ SOLN
INTRAMUSCULAR | Status: DC | PRN
  Administered 2016-12-22 (×2): 50 ug via INTRAVENOUS

## 2016-12-22 MED ORDER — HEPARIN SODIUM (PORCINE) 1000 UNIT/ML IJ SOLN
INTRAMUSCULAR | Status: DC | PRN
  Administered 2016-12-22: 5000 [IU] via INTRAVENOUS

## 2016-12-22 MED ORDER — MEPERIDINE HCL 25 MG/ML IJ SOLN: 6.2500 mg | INTRAMUSCULAR | Status: DC | PRN

## 2016-12-22 MED ORDER — SODIUM CHLORIDE 0.9 % IV SOLN
INTRAVENOUS | Status: DC
  Administered 2016-12-22: 09:00:00 via INTRAVENOUS

## 2016-12-22 MED ORDER — LOPERAMIDE HCL 2 MG PO CAPS
2.0000 mg | ORAL_CAPSULE | ORAL | Status: DC | PRN
  Administered 2016-12-22: 2 mg via ORAL

## 2016-12-22 MED ORDER — FERRIC CITRATE 1 GM 210 MG(FE) PO TABS
420.0000 mg | ORAL_TABLET | Freq: Three times a day (TID) | ORAL | Status: DC
  Administered 2016-12-22 – 2016-12-26 (×6): 420 mg via ORAL
  Filled 2016-12-22 (×13): qty 2

## 2016-12-22 MED ORDER — 0.9 % SODIUM CHLORIDE (POUR BTL) OPTIME
TOPICAL | Status: DC | PRN
  Administered 2016-12-22: 1000 mL

## 2016-12-22 MED ORDER — LIDOCAINE HCL (PF) 1 % IJ SOLN
INTRAMUSCULAR | Status: AC
  Filled 2016-12-22: qty 30

## 2016-12-22 MED ORDER — RENA-VITE PO TABS
1.0000 | ORAL_TABLET | Freq: Every day | ORAL | Status: DC
  Administered 2016-12-22 – 2016-12-25 (×4): 1 via ORAL
  Filled 2016-12-22 (×4): qty 1

## 2016-12-22 MED ORDER — FENTANYL CITRATE (PF) 100 MCG/2ML IJ SOLN: 25.0000 ug | INTRAMUSCULAR | Status: DC | PRN

## 2016-12-22 SURGICAL SUPPLY — 56 items
ADH SKN CLS APL DERMABOND .7 (GAUZE/BANDAGES/DRESSINGS) ×1
ARMBAND PINK RESTRICT EXTREMIT (MISCELLANEOUS) ×2 IMPLANT
BAG DECANTER FOR FLEXI CONT (MISCELLANEOUS) ×1 IMPLANT
BIOPATCH RED 1 DISK 7.0 (GAUZE/BANDAGES/DRESSINGS) ×1 IMPLANT
CANISTER SUCTION 2500CC (MISCELLANEOUS) ×2 IMPLANT
CATH PALINDROME RT-P 15FX19CM (CATHETERS) IMPLANT
CATH PALINDROME RT-P 15FX23CM (CATHETERS) IMPLANT
CATH PALINDROME RT-P 15FX28CM (CATHETERS) IMPLANT
CATH PALINDROME RT-P 15FX55CM (CATHETERS) IMPLANT
CLIP TI MEDIUM 6 (CLIP) ×2 IMPLANT
CLIP TI WIDE RED SMALL 6 (CLIP) ×2 IMPLANT
COVER PROBE W GEL 5X96 (DRAPES) ×1 IMPLANT
COVER SURGICAL LIGHT HANDLE (MISCELLANEOUS) ×1 IMPLANT
DERMABOND ADVANCED (GAUZE/BANDAGES/DRESSINGS) ×1
DERMABOND ADVANCED .7 DNX12 (GAUZE/BANDAGES/DRESSINGS) ×1 IMPLANT
DRAPE C-ARM 42X72 X-RAY (DRAPES) ×1 IMPLANT
DRAPE CHEST BREAST 15X10 FENES (DRAPES) ×1 IMPLANT
ELECT REM PT RETURN 9FT ADLT (ELECTROSURGICAL) ×2
ELECTRODE REM PT RTRN 9FT ADLT (ELECTROSURGICAL) ×1 IMPLANT
GAUZE SPONGE 2X2 8PLY STRL LF (GAUZE/BANDAGES/DRESSINGS) IMPLANT
GAUZE SPONGE 4X4 16PLY XRAY LF (GAUZE/BANDAGES/DRESSINGS) ×1 IMPLANT
GLOVE BIO SURGEON STRL SZ 6.5 (GLOVE) ×1 IMPLANT
GLOVE BIO SURGEON STRL SZ7.5 (GLOVE) ×2 IMPLANT
GLOVE BIOGEL PI IND STRL 7.0 (GLOVE) IMPLANT
GLOVE BIOGEL PI INDICATOR 7.0 (GLOVE) ×1
GOWN STRL REUS W/ TWL LRG LVL3 (GOWN DISPOSABLE) ×2 IMPLANT
GOWN STRL REUS W/ TWL XL LVL3 (GOWN DISPOSABLE) ×1 IMPLANT
GOWN STRL REUS W/TWL LRG LVL3 (GOWN DISPOSABLE) ×6
GOWN STRL REUS W/TWL XL LVL3 (GOWN DISPOSABLE) ×2
KIT BASIN OR (CUSTOM PROCEDURE TRAY) ×2 IMPLANT
KIT ROOM TURNOVER OR (KITS) ×2 IMPLANT
NDL 18GX1X1/2 (RX/OR ONLY) (NEEDLE) ×1 IMPLANT
NDL HYPO 25GX1X1/2 BEV (NEEDLE) ×1 IMPLANT
NEEDLE 18GX1X1/2 (RX/OR ONLY) (NEEDLE) IMPLANT
NEEDLE HYPO 25GX1X1/2 BEV (NEEDLE) IMPLANT
NS IRRIG 1000ML POUR BTL (IV SOLUTION) ×2 IMPLANT
PACK CV ACCESS (CUSTOM PROCEDURE TRAY) ×2 IMPLANT
PACK SURGICAL SETUP 50X90 (CUSTOM PROCEDURE TRAY) ×1 IMPLANT
PAD ARMBOARD 7.5X6 YLW CONV (MISCELLANEOUS) ×4 IMPLANT
SOAP 2 % CHG 4 OZ (WOUND CARE) ×1 IMPLANT
SPONGE GAUZE 2X2 STER 10/PKG (GAUZE/BANDAGES/DRESSINGS)
SUT ETHILON 3 0 PS 1 (SUTURE) ×1 IMPLANT
SUT MNCRL AB 4-0 PS2 18 (SUTURE) ×2 IMPLANT
SUT PROLENE 4 0 SH DA (SUTURE) ×1 IMPLANT
SUT PROLENE 5 0 C 1 24 (SUTURE) ×1 IMPLANT
SUT PROLENE 6 0 BV (SUTURE) ×2 IMPLANT
SUT VIC AB 3-0 SH 27 (SUTURE) ×4
SUT VIC AB 3-0 SH 27X BRD (SUTURE) ×1 IMPLANT
SYR 20CC LL (SYRINGE) ×2 IMPLANT
SYR 5ML LL (SYRINGE) ×1 IMPLANT
SYR CONTROL 10ML LL (SYRINGE) ×1 IMPLANT
SYRINGE 10CC LL (SYRINGE) ×1 IMPLANT
TOWEL OR 17X24 6PK STRL BLUE (TOWEL DISPOSABLE) ×1 IMPLANT
TOWEL OR 17X26 4PK STRL BLUE (TOWEL DISPOSABLE) ×1 IMPLANT
UNDERPAD 30X30 (UNDERPADS AND DIAPERS) ×2 IMPLANT
WATER STERILE IRR 1000ML POUR (IV SOLUTION) ×2 IMPLANT

## 2016-12-22 NOTE — Progress Notes (Signed)
Spoke with Dr. Melvia Heaps.  Pt did not have HD yesterday.  They will see pt today and she should get HD.  May be able to discharge after HD.  Leontine Locket, [facility] 12/22/2016 10:20 AM

## 2016-12-22 NOTE — Op Note (Signed)
Patient name: [name] MRN: [id number] DOB: July 05, 1951 Sex: female  12/22/2016 Pre-operative Diagnosis: esrd Post-operative diagnosis:  Same Surgeon:  Apolinar Junes C. Randie Heinz, MD Assistant: Doreatha Massed, PA Procedure Performed: revision of left arm arteriovenous fistula with excision of pseudoaneurysm  Indications:  64 show female on dialysis via left arm AV fistula has recent fistulogram demonstrating tortuosity of pseudoaneurysms. She was evaluated in our EmergencyDepartment for bleeding from one of her pseudoaneurysms with overlying ulceration. She had unfortunately eaten just prior to presenting and so was scheduled to be done electively.  Findings: The area of concern was a large thin capsule pseudoaneurysm densely adherent overlying skin. The fistula itself was healthy proximally and distally. Pseudoaneurysm was resected and the neck oversewn. At completion there was a thrill proximally and distally to our incision site with enough usable fistula for dialysis.   Procedure:  The patient was identified in the holding area and taken to the operating room where she was placed supine on the operating table general anesthesia was induced she was sterilely prepped and draped in usual fashion and antibiotics were administered. Timeout was called we placed a sterile tourniquet around her left upper arm but not inflated. The fistula was traced out by palpation and then a longitudinal incision made overlying the pseudoaneurysm on to the fistula proximally and distally. We dissected sharply out getting proximal control first placing a vessel loop around the fistula there. Then turned attention distally and dissected the fistula as well as the pseudoaneurysm capsule placed a vessel loop around 4 distal control. Then using electrocautery we dissected out the entirety of the pseudoaneurysm. Patient was at this time heparinized and circulated for 2 minutes the fistula was clamped proximally and distally and  had no further thrill or pulsatility within it. It was opened with a knife and then most of the pseudoaneurysm capsule was resected sharply. I allowed antegrade and retrograde flow to confirm fistula was still viable. I then oversewed the neck of the pseudoaneurysm with 5-0 Prolene suture in a mattress configuration. Prior to releasing the clamps to allow flushing maneuvers. With clamps released we did have a thrill proximally and distally to our incision site. Some skin did have to be resected with the ulceration. Hemostasis was obtained in the wound closed with interrupted 3-0 Vicryl followed by 4 Monocryl and Dermabond placed level skin. Patient did have palpable radial pulse at completion. She is laterally from anesthesia having tolerated the procedure well.    Phillis Thackeray C. Randie Heinz, MD [facility] [place] Office: [phone] Pager: [phone]

## 2016-12-22 NOTE — Transfer of Care (Signed)
Immediate Anesthesia Transfer of Care Note  Patient: [name]  Procedure(s) Performed: Procedure(s): REVISON OF LEFT ARTERIOVENOUS FISTULA (Left)  Patient Location: PACU  Anesthesia Type:General  Level of Consciousness: sedated and patient cooperative  Airway & Oxygen Therapy: Patient Spontanous Breathing and Patient connected to nasal cannula oxygen  Post-op Assessment: Report given to RN and Post -op Vital signs reviewed and stable  Post vital signs: Reviewed and stable  Last Vitals:  Vitals:   12/22/16 0602 12/22/16 0859  BP: (!) 146/80 (!) 148/82  Pulse: 80 74  Resp: 18   Temp: 36.7 C     Last Pain:  Vitals:   12/22/16 0602  TempSrc: Oral  PainSc:          Complications: No apparent anesthesia complications

## 2016-12-22 NOTE — Anesthesia Preprocedure Evaluation (Addendum)
Anesthesia Evaluation  Patient identified by MRN, date of birth, ID band Patient awake    Reviewed: Allergy & Precautions, NPO status , Patient's Chart, lab work & pertinent test results, reviewed documented beta blocker date and time   History of Anesthesia Complications Negative for: history of anesthetic complications  Airway Mallampati: II  TM Distance: >3 FB Neck ROM: Full    Dental  (+) Dental Advisory Given, Missing   Pulmonary shortness of breath, COPD, Recent URI , Residual Cough, former smoker,    breath sounds clear to auscultation       Cardiovascular hypertension, Pt. on medications and Pt. on home beta blockers (-) angina Rhythm:Regular Rate:Normal     Neuro/Psych Anxiety TIACVA, No Residual Symptoms negative neurological ROS     GI/Hepatic Neg liver ROS, GERD  Controlled,  Endo/Other  negative endocrine ROS  Renal/GU Dialysis and ESRFRenal disease (K+ 5.1)     Musculoskeletal  (+) Arthritis ,   Abdominal   Peds  Hematology  (+) Blood dyscrasia (Hb 8.1), anemia ,   Anesthesia Other Findings   Reproductive/Obstetrics                            Anesthesia Physical Anesthesia Plan  ASA: III  Anesthesia Plan: General   Post-op Pain Management:    Induction: Intravenous  Airway Management Planned: LMA  Additional Equipment: None  Intra-op Plan:   Post-operative Plan:   Informed Consent: I have reviewed the patients History and Physical, chart, labs and discussed the procedure including the risks, benefits and alternatives for the proposed anesthesia with the patient or authorized representative who has indicated his/her understanding and acceptance.   Dental advisory given  Plan Discussed with: CRNA, Anesthesiologist and Surgeon  Anesthesia Plan Comments: (Plan routine monitors, GA- LMA OK)       Anesthesia Quick Evaluation

## 2016-12-22 NOTE — Discharge Instructions (Signed)
° ° °  12/22/2016 KRISSA UTKE [id number] 06/06/1951  Surgeon(s): Waynetta Sandy, MD  Procedure(s): REVISON OF ARTERIOVENOUS FISTULA/POSSIBE LIGATION INSERTION OF DIALYSIS CATHETER/POSSIBLE  x May stick graft on designated area only:  May stick above and below incision immediately.   Do NOT stick over incision.

## 2016-12-22 NOTE — Anesthesia Postprocedure Evaluation (Signed)
Anesthesia Post Note  Patient: [name]  Procedure(s) Performed: Procedure(s) (LRB): REVISON OF LEFT ARTERIOVENOUS FISTULA (Left)  Patient location during evaluation: PACU Anesthesia Type: General Level of consciousness: awake and alert, oriented and patient cooperative Pain management: pain level controlled Vital Signs Assessment: post-procedure vital signs reviewed and stable Respiratory status: spontaneous breathing, nonlabored ventilation and respiratory function stable Cardiovascular status: blood pressure returned to baseline and stable Postop Assessment: no signs of nausea or vomiting Anesthetic complications: no       Last Vitals:  Vitals:   12/22/16 1500 12/22/16 1515  BP: 122/69 118/67  Pulse: 72 74  Resp:    Temp:      Last Pain:  Vitals:   12/22/16 1444  TempSrc:   PainSc: 0-No pain                 Hubbard Seldon,E. Absalom Aro

## 2016-12-22 NOTE — Consult Note (Signed)
[facility] Renal Consultation Note    Indication for Consultation:  Management of ESRD/hemodialysis; anemia, hypertension/volume and secondary hyperparathyroidism  HPI: [name] is a 66 y.o. 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 She receives dialysis MWF at [facility]. She was sent to ED yesterday prior to receiving HD  to have AVF evaluated. Area of concern was a large area of thinning skin with ulceration over LUE AVF. She reports episodes of prolonged bleeding from access during and after HD. She was seen by VVS in ED and underwent revision of L AVF with excision of pseudoaneurysm this am. Per Dr. Claretha Cooper note fistula itself is healthy. She is seen on HD currently, AVF cannulated. She is having some post-op pain but denies chills, nausea, vomiting, dyspnea, chest pain.   Past Medical History:  Diagnosis Date  . Adenomatous polyp of colon 10/2010, 2006, 2015  . Anemia in CKD (chronic kidney disease) 11/07/2012   s/p blood transfusion.   . Arthritis   . CHF (congestive heart failure) ([place])   . Constipation   . Depression with anxiety   . Diverticula, colon   . 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  Had deceased donor kidney transplant in 1996.  Had some early rejection then stable function for years, then had slow decline of function and went back on hemodialysis in 2012.  Gets HD TTS schedule at [facility] on [address] still using L forearm AVF.     Marland Kitchen GERD (gastroesophageal reflux disease)   . Headache   . Hyperlipidemia   . Hypertension   . Neuromuscular disorder (HCC)    neuropathy hand and legs  . Osteoporosis   . Pneumonia   . Stroke [facility]) 11/2015   TIA  . Weight loss, unintentional    Past Surgical History:  Procedure Laterality Date  . AV FISTULA PLACEMENT     for dialysis  . AV FISTULA PLACEMENT Left 11/22/2015   Procedure: ARTERIOVENOUS (AV) FISTULA CREATION-LEFT BRACHIOCEPHALIC;  Surgeon: Serafina Mitchell, MD;  Location: [place];  Service: Vascular;  Laterality: Left;  . BACK SURGERY    . CERVICAL FUSION    . CHOLECYSTECTOMY  12/02/2012   Procedure: LAPAROSCOPIC CHOLECYSTECTOMY WITH INTRAOPERATIVE CHOLANGIOGRAM;  Surgeon: Edward Jolly, MD;  Location: [place];  Service: General;  Laterality: N/A;  . EYE SURGERY Bilateral    cataract surgery  . KIDNEY TRANSPLANT  1996  . PERIPHERAL VASCULAR CATHETERIZATION Left 10/23/2016   Procedure: Fistulagram;  Surgeon: Elam Dutch, MD;  Location: [place] CV LAB;  Service: Cardiovascular;  Laterality: Left;  . RESECTION OF ARTERIOVENOUS FISTULA ANEURYSM Left 11/22/2015   Procedure: RESECTION OF LEFT RADIOCEPHALIC FISTULA ANEURYSM ;  Surgeon: Serafina Mitchell, MD;  Location: [facility] OR;  Service: Vascular;  Laterality: Left;   Family History  Problem Relation Age of Onset  . Colon cancer Brother   . Cancer Brother   . Coronary artery disease Mother 66  . Hyperlipidemia Mother   . Hypertension Mother   . Esophageal cancer Neg Hx   . Stomach cancer Neg Hx   . Rectal cancer Neg Hx    Social History:  reports that she quit smoking about 24 years ago. Her smoking use included Cigarettes. She has never used smokeless tobacco. She reports that she does not drink alcohol or use drugs. Allergies  Allergen Reactions  . Sulfa Antibiotics  Other (See Comments)    Both parents allergic-so will not take   Prior to Admission medications   Medication Sig Start Date End Date Taking? Authorizing Provider  acetaminophen (TYLENOL) 325 MG tablet Take 650 mg by mouth every 6 (six) hours as needed for mild pain.   Yes Historical Provider, MD  acetaminophen-codeine (TYLENOL #3) 300-30 MG tablet Take 1-2 tablets by mouth every 6 (six) hours as needed for moderate pain. 06/11/16  Yes Domenic Moras, PA-C  allopurinol (ZYLOPRIM) 300 MG tablet Take 150 mg by mouth at bedtime.   Yes  Historical Provider, MD  ALPRAZolam (XANAX) 0.25 MG tablet Take 0.25 mg by mouth 2 (two) times daily as needed for anxiety.    Yes Historical Provider, MD  aspirin EC 325 MG tablet Take 325 mg by mouth at bedtime.   Yes Historical Provider, MD  B Complex-C-Folic Acid (DIALYVITE PO) Take 1 tablet by mouth daily.   Yes Historical Provider, MD  doxazosin (CARDURA) 8 MG tablet Take 1 tablet (8 mg total) by mouth 2 (two) times daily as needed (hypertension). Patient taking differently: Take 8 mg by mouth at bedtime.  11/17/15  Yes Modena Jansky, MD  labetalol (NORMODYNE) 200 MG tablet Take 200 mg by mouth 2 (two) times daily.    Yes Historical Provider, MD  lactulose (CHRONULAC) 10 GM/15ML solution Take 20 g by mouth daily as needed for mild constipation.   Yes Historical Provider, MD  losartan (COZAAR) 100 MG tablet Take 100 mg by mouth at bedtime.    Yes Historical Provider, MD  meclizine (ANTIVERT) 25 MG tablet Take 1 tablet (25 mg total) by mouth 2 (two) times daily as needed for dizziness. 06/24/16  Yes Merryl Hacker, MD  promethazine (PHENERGAN) 25 MG tablet Take 25 mg by mouth every 6 (six) hours as needed for nausea or vomiting.   Yes Historical Provider, MD  SENSIPAR 30 MG tablet Take 30 mg by mouth at bedtime. 08/20/16  Yes Historical Provider, MD  simvastatin (ZOCOR) 10 MG tablet Take 10 mg by mouth at bedtime.    Yes Historical Provider, MD  zolpidem (AMBIEN) 10 MG tablet Take 10 mg by mouth at bedtime as needed for sleep.    Yes Historical Provider, MD  oxyCODONE-acetaminophen (ROXICET) 5-325 MG tablet Take 1 tablet by mouth every 6 (six) hours as needed for severe pain. 12/22/16   Hulen Shouts Rhyne, PA-C   Current Facility-Administered Medications  Medication Dose Route Frequency Provider Last Rate Last Dose  . 0.9 %  sodium chloride infusion  250 mL Intravenous PRN Alvia Grove, PA-C      . 0.9 %  sodium chloride infusion  100 mL Intravenous PRN Madelon Lips, MD      . 0.9 %   sodium chloride infusion  100 mL Intravenous PRN Madelon Lips, MD      . 0.9 %  sodium chloride infusion   Intravenous Continuous Annye Asa, MD 10 mL/hr at 12/22/16 0850    . acetaminophen (TYLENOL) tablet 650 mg  650 mg Oral Q6H PRN Alvia Grove, PA-C      . acetaminophen-codeine (TYLENOL #3) 300-30 MG per tablet 1-2 tablet  1-2 tablet Oral Q6H PRN Alvia Grove, PA-C      . allopurinol (ZYLOPRIM) tablet 150 mg  150 mg Oral QHS Alvia Grove, PA-C   150 mg at 12/21/16 2255  . ALPRAZolam Duanne Moron) tablet 0.25 mg  0.25 mg Oral BID PRN Alvia Grove, PA-C      .  alteplase (CATHFLO ACTIVASE) injection 2 mg  2 mg Intracatheter Once PRN Madelon Lips, MD      . alum & mag hydroxide-simeth (MAALOX/MYLANTA) 200-200-20 MG/5ML suspension 15-30 mL  15-30 mL Oral Q2H PRN Alvia Grove, PA-C      . aspirin EC tablet 325 mg  325 mg Oral QHS Alvia Grove, PA-C   325 mg at 12/21/16 2255  . calcitRIOL (ROCALTROL) 0.25 MCG capsule           . calcitRIOL (ROCALTROL) 0.5 MCG capsule           . calcitRIOL (ROCALTROL) capsule 2.25 mcg  2.25 mcg Oral Q dialysis Madelon Lips, MD   2.25 mcg at 12/22/16 1411  . cinacalcet (SENSIPAR) tablet 30 mg  30 mg Oral Q supper Alvia Grove, PA-C   30 mg at 12/21/16 2256  . doxazosin (CARDURA) tablet 8 mg  8 mg Oral QHS Alvia Grove, PA-C   8 mg at 12/21/16 2255  . guaiFENesin-dextromethorphan (ROBITUSSIN DM) 100-10 MG/5ML syrup 15 mL  15 mL Oral Q4H PRN Alvia Grove, PA-C      . heparin injection 1,000 Units  1,000 Units Dialysis PRN Madelon Lips, MD      . hydrALAZINE (APRESOLINE) injection 5 mg  5 mg Intravenous Q20 Min PRN Alvia Grove, PA-C      . labetalol (NORMODYNE) tablet 100 mg  100 mg Oral BID Alvia Grove, PA-C   100 mg at 12/22/16 0859  . labetalol (NORMODYNE,TRANDATE) injection 10 mg  10 mg Intravenous Q10 min PRN Alvia Grove, PA-C      . lactulose (CHRONULAC) 10 GM/15ML solution 20 g  20 g Oral Daily PRN  Alvia Grove, PA-C      . lidocaine (PF) (XYLOCAINE) 1 % injection 5 mL  5 mL Intradermal PRN Madelon Lips, MD      . lidocaine-prilocaine (EMLA) cream 1 application  1 application Topical PRN Madelon Lips, MD      . loperamide (IMODIUM) 2 MG capsule           . loperamide (IMODIUM) capsule 2 mg  2 mg Oral PRN Elmarie Shiley, MD   2 mg at 12/22/16 1411  . losartan (COZAAR) tablet 100 mg  100 mg Oral QHS Alvia Grove, PA-C   100 mg at 12/21/16 2255  . meclizine (ANTIVERT) tablet 25 mg  25 mg Oral BID PRN Alvia Grove, PA-C      . metoprolol (LOPRESSOR) injection 2-5 mg  2-5 mg Intravenous Q2H PRN Alvia Grove, PA-C      . morphine 2 MG/ML injection 1 mg  1 mg Intravenous Q1H PRN Alvia Grove, PA-C      . ondansetron (ZOFRAN) injection 4 mg  4 mg Intravenous Q6H PRN Alvia Grove, PA-C      . pantoprazole (PROTONIX) EC tablet 40 mg  40 mg Oral Daily Alvia Grove, PA-C      . pentafluoroprop-tetrafluoroeth (GEBAUERS) aerosol 1 application  1 application Topical PRN Madelon Lips, MD      . phenol (CHLORASEPTIC) mouth spray 1 spray  1 spray Mouth/Throat PRN Alvia Grove, PA-C      . promethazine (PHENERGAN) tablet 25 mg  25 mg Oral Q6H PRN Alvia Grove, PA-C      . simvastatin (ZOCOR) tablet 10 mg  10 mg Oral QHS Alvia Grove, PA-C   10 mg at 12/21/16 2255  . sodium chloride flush (  NS) 0.9 % injection 3 mL  3 mL Intravenous Q12H Kimberly A Trinh, PA-C      . sodium chloride flush (NS) 0.9 % injection 3 mL  3 mL Intravenous PRN Alvia Grove, PA-C      . zolpidem (AMBIEN) tablet 5 mg  5 mg Oral QHS PRN Alvia Grove, PA-C   5 mg at 12/21/16 2325   Labs: Basic Metabolic Panel:  Recent Labs Lab 12/21/16 1735 12/22/16 0310  NA 138 136  K 5.2* 5.1  CL 93* 93*  CO2 26 25  GLUCOSE 219* 104*  BUN 73* 82*  CREATININE 9.73* 10.39*  CALCIUM 9.2 8.6*   Liver Function Tests: No results for input(s): AST, ALT, ALKPHOS, BILITOT, PROT, ALBUMIN in the  last 168 hours. No results for input(s): LIPASE, AMYLASE in the last 168 hours. No results for input(s): AMMONIA in the last 168 hours. CBC:  Recent Labs Lab 12/21/16 1735 12/22/16 0310  WBC 8.2 6.7  HGB 9.1* 8.1*  HCT 28.8* 25.1*  MCV 91.1 90.0  PLT 280 251   Cardiac Enzymes: No results for input(s): CKTOTAL, CKMB, CKMBINDEX, TROPONINI in the last 168 hours. CBG: No results for input(s): GLUCAP in the last 168 hours. Iron Studies: No results for input(s): IRON, TIBC, TRANSFERRIN, FERRITIN in the last 72 hours. Studies/Results: No results found.  ROS: As per HPI otherwise negative.  Physical Exam: Vitals:   12/22/16 1248 12/22/16 1340 12/22/16 1347 12/22/16 1352  BP: 133/77 132/72 136/76 138/79  Pulse: 79 71 71 81  Resp: 16 19    Temp: 97.6 F (36.4 C) 97.5 F (36.4 C)    TempSrc: Oral Oral    SpO2: 100% 100%    Weight:  57.5 kg (126 lb 12.2 oz)    Height:         General: Pleasant WDWN NAD Head: NCAT sclera not icteric MMM Neck: Supple.  Lungs: CTA bilaterally without wheezes, rales, or rhonchi. Breathing is unlabored. Heart: RRR with S1 S2.  Abdomen: soft NT + BS Lower extremities:without edema or ischemic changes, no open wounds  Neuro: A & O  X 3. Moves all extremities spontaneously. Psych:  Responds to questions appropriately with a normal affect. Dialysis Access: LUE AVF cannulated on HD   Dialysis Orders:  [facility] M[facility] 3:45 BFR 350 2K/2Ca EDW 53.5 kg  Heparin 3000 U IV Bolus Calcitriol 2.25 mcg PO 3x q week Mircera 100 mcg IV q 2 weeks ( last 12/16/16)   Assessment/Plan: 1.  L AVF ulceration - per admit pseudoaneurysm resected today   2.  ESRD -  MWF - missed HD yesterday - to have HD off schedule today  3.  Hypertension/volume  - BP controlled/cont home meds / Wt up 4.4 kg pre HD UF goal 4.5L today  4.  Anemia  - Hgb 8.1 post op - follow on OP ESA - last OP Tsat 18% - give Fe with HD today  5.  Metabolic bone disease -  Cont VDRA/Aurxyia 2 qac 6.   Nutrition - renal diet/vitamins  7.  Hx CVA  Lynnda Child PA-C [facility] Pager [phone] 12/22/2016, 2:56 PM   Pt seen, examined and agree w A/P as above.  Kelly Splinter MD [employer] pager [phone]   12/22/2016, 5:19 PM

## 2016-12-22 NOTE — Anesthesia Procedure Notes (Signed)
Procedure Name: LMA Insertion Date/Time: 12/22/2016 9:16 AM Performed by: Melina Copa, Nnaemeka Samson R Pre-anesthesia Checklist: Patient identified, Emergency Drugs available, Suction available and Patient being monitored Patient Re-evaluated:Patient Re-evaluated prior to inductionOxygen Delivery Method: Circle System Utilized Preoxygenation: Pre-oxygenation with 100% oxygen Intubation Type: IV induction Ventilation: Mask ventilation without difficulty LMA: LMA inserted LMA Size: 4.0 Number of attempts: 1 Airway Equipment and Method: Bite block Placement Confirmation: positive ETCO2 Tube secured with: Tape Dental Injury: Teeth and Oropharynx as per pre-operative assessment

## 2016-12-22 NOTE — Procedures (Signed)
  I was present at this dialysis session, have reviewed the session itself and made  appropriate changes Kelly Splinter MD [place] pager [phone]   12/22/2016, 5:20 PM

## 2016-12-22 NOTE — Progress Notes (Signed)
  Progress Note    12/22/2016 8:35 AM Day of Surgery  Subjective:  No bleeding issues overnigh  Vitals:   12/21/16 2200 12/22/16 0602  BP: (!) 198/108 (!) 146/80  Pulse: 100 80  Resp: 18 18  Temp: 98 F (36.7 C) 98.1 F (36.7 C)    Physical Exam: aaox3 Palpable left radial pulse Ulceration of left arm av fistula without bleeding  CBC    Component Value Date/Time   WBC 6.7 12/22/2016 0310   RBC 2.79 (L) 12/22/2016 0310   HGB 8.1 (L) 12/22/2016 0310   HGB 8.7 (L) 09/24/2011 1038   HCT 25.1 (L) 12/22/2016 0310   HCT 27.3 (L) 09/24/2011 1038   PLT 251 12/22/2016 0310   PLT 209 09/24/2011 1038   MCV 90.0 12/22/2016 0310   MCV 94.5 09/24/2011 1038   MCH 29.0 12/22/2016 0310   MCHC 32.3 12/22/2016 0310   RDW 18.0 (H) 12/22/2016 0310   RDW 20.1 (H) 09/24/2011 1038   LYMPHSABS 1.9 06/24/2016 0135   LYMPHSABS 1.1 09/24/2011 1038   MONOABS 0.4 06/24/2016 0135   MONOABS 0.6 09/24/2011 1038   EOSABS 0.2 06/24/2016 0135   EOSABS 0.1 09/24/2011 1038   BASOSABS 0.0 06/24/2016 0135   BASOSABS 0.0 09/24/2011 1038    BMET    Component Value Date/Time   NA 136 12/22/2016 0310   K 5.1 12/22/2016 0310   CL 93 (L) 12/22/2016 0310   CO2 25 12/22/2016 0310   GLUCOSE 104 (H) 12/22/2016 0310   BUN 82 (H) 12/22/2016 0310   CREATININE 10.39 (H) 12/22/2016 0310   CALCIUM 8.6 (L) 12/22/2016 0310   CALCIUM 9.6 08/03/2011 1005   GFRNONAA 3 (L) 12/22/2016 0310   GFRAA 4 (L) 12/22/2016 0310    INR    Component Value Date/Time   INR 1.13 11/19/2015 0926     Intake/Output Summary (Last 24 hours) at 12/22/16 0835 Last data filed at 12/21/16 2222  Gross per 24 hour  Intake              120 ml  Output                0 ml  Net              120 ml     Assessment:  66 y.o. female is here with ulceration over her left upper avf  Plan: OR today for revision of left arm av fistula. Discussed risks including possible sacrifice of avf and possible need for temporary catheter. She  agrees to proceed.    Mavin Dyke C. Donzetta Matters, MD [facility] Office: [phone] Pager: [phone]  12/22/2016 8:35 AM

## 2016-12-22 NOTE — Progress Notes (Signed)
Positive LUE bruit & thrill on adm and DC from PACU

## 2016-12-23 ENCOUNTER — Encounter (HOSPITAL_COMMUNITY): Payer: Self-pay | Admitting: Vascular Surgery

## 2016-12-23 LAB — RENAL FUNCTION PANEL
ALBUMIN: 2.3 g/dL — AB (ref 3.5–5.0)
ANION GAP: 12 (ref 5–15)
BUN: 45 mg/dL — ABNORMAL HIGH (ref 6–20)
CO2: 26 mmol/L (ref 22–32)
Calcium: 7.7 mg/dL — ABNORMAL LOW (ref 8.9–10.3)
Chloride: 95 mmol/L — ABNORMAL LOW (ref 101–111)
Creatinine, Ser: 7.37 mg/dL — ABNORMAL HIGH (ref 0.44–1.00)
GFR calc Af Amer: 6 mL/min — ABNORMAL LOW (ref >60)
GFR, EST NON AFRICAN AMERICAN: 5 mL/min — AB (ref >60)
GLUCOSE: 126 mg/dL — AB (ref 65–99)
PHOSPHORUS: 5.1 mg/dL — AB (ref 2.5–4.6)
POTASSIUM: 4.3 mmol/L (ref 3.5–5.1)
Sodium: 133 mmol/L — ABNORMAL LOW (ref 135–145)

## 2016-12-23 MED ORDER — NEPRO/CARBSTEADY PO LIQD
237.0000 mL | Freq: Two times a day (BID) | ORAL | Status: DC
  Administered 2016-12-23 – 2016-12-26 (×2): 237 mL via ORAL
  Filled 2016-12-23 (×9): qty 237

## 2016-12-23 MED ORDER — DIPHENHYDRAMINE HCL 25 MG PO CAPS
25.0000 mg | ORAL_CAPSULE | Freq: Four times a day (QID) | ORAL | Status: DC | PRN
Start: 2016-12-23 — End: 2016-12-26
  Administered 2016-12-23 – 2016-12-24 (×3): 25 mg via ORAL
  Filled 2016-12-23: qty 1

## 2016-12-23 MED ORDER — CEFAZOLIN IN D5W 1 GM/50ML IV SOLN
1.0000 g | INTRAVENOUS | Status: AC
  Administered 2016-12-24: 1 g via INTRAVENOUS
  Filled 2016-12-23: qty 50

## 2016-12-23 NOTE — Progress Notes (Signed)
Initial Nutrition Assessment  DOCUMENTATION CODES:   Severe malnutrition in context of chronic illness  INTERVENTION:  Provide Nepro Shake po BID, each supplement provides 425 kcal and 19 grams protein   NUTRITION DIAGNOSIS:   Increased nutrient needs related to chronic illness, catabolic illness as evidenced by estimated needs, mild depletion of body fat, moderate depletions of muscle mass.   GOAL:   Patient will meet greater than or equal to 90% of their needs   MONITOR:   PO intake, Labs, Weight trends, Skin, I & O's  REASON FOR ASSESSMENT:   Malnutrition Screening Tool    ASSESSMENT:   66 y.o. female with ESRD on HD (MWF) and history of CHF and HTN, who presents with 4 week history of prolonged bleeding from her left upper arm fistu  Pt states that she has had a decreased appetite for the past 2 days and less today due to nausea from medications, but even usually she doesn't eat very well. She reports losing weight; states that she usually weighs 122 lbs. Per weight history, pt had lost down to 117 lbs 2 months ago, but weight now is at 125 lbs. Pt does however have moderate muscle wasting and mild fat wasting per nutrition-focused physical exam. RD discussed the importance of adequate calorie and protein intake. Pt states that she has discussed nutrition with dietitian at dialysis center and started drinking Nepro Shakes while at HD. She is agreeable to continuing supplement BID while admitted.   Labs: low sodium, low chloride, high creatinine, low calcium, elevated phosphorus, low hemoglobin  Diet Order:  Diet renal with fluid restriction Fluid restriction: 1200 mL Fluid; Room service appropriate? Yes; Fluid consistency: Thin Diet NPO time specified Except for: Sips with Meds  Skin:  Wound (see comment) (closed incision on left arm)  Last BM:  1/8  Height:   Ht Readings from Last 1 Encounters:  12/21/16 5' 2.5" (1.588 m)    Weight:   Wt Readings from Last 1  Encounters:  12/23/16 125 lb 8 oz (56.9 kg)    Ideal Body Weight:  51.1 kg  BMI:  Body mass index is 22.59 kg/m.  Estimated Nutritional Needs:   Kcal:  1800-2000  Protein:  70-80 grams  Fluid:  per MD  EDUCATION NEEDS:   No education needs identified at this time  [name] RD, CSP, LDN Inpatient Clinical Dietitian Pager: [phone] After Hours Pager: [phone]

## 2016-12-23 NOTE — Progress Notes (Signed)
[facility] Progress Note   Subjective: had HD last night and infiltrated about 3 hrs in. No new c/o's.   Vitals:   12/22/16 1600 12/22/16 1623 12/22/16 2015 12/23/16 0443  BP: 125/71 119/68 117/71 136/71  Pulse: 75 76 81 83  Resp:  13 18 18   Temp:  97.8 F (36.6 C) 98.5 F (36.9 C) 98.3 F (36.8 C)  TempSrc:  Oral Oral Oral  SpO2:  100% 98% 93%  Weight:  55.2 kg (121 lb 11.1 oz)  56.9 kg (125 lb 8 oz)  Height:        Inpatient medications: . allopurinol  150 mg Oral QHS  . aspirin EC  325 mg Oral QHS  . cinacalcet  30 mg Oral Q supper  . doxazosin  8 mg Oral QHS  . ferric citrate  420 mg Oral TID WC  . labetalol  100 mg Oral BID  . losartan  100 mg Oral QHS  . multivitamin  1 tablet Oral QHS  . pantoprazole  40 mg Oral Daily  . simvastatin  10 mg Oral QHS  . sodium chloride flush  3 mL Intravenous Q12H   . sodium chloride 10 mL/hr at 12/22/16 0850   sodium chloride, sodium chloride, sodium chloride, acetaminophen, acetaminophen-codeine, ALPRAZolam, alteplase, alum & mag hydroxide-simeth, calcitRIOL, guaiFENesin-dextromethorphan, heparin, hydrALAZINE, labetalol, lactulose, lidocaine (PF), lidocaine-prilocaine, loperamide, meclizine, metoprolol, morphine injection, ondansetron, pentafluoroprop-tetrafluoroeth, phenol, promethazine, sodium chloride flush, zolpidem  Exam: General: Pleasant WDWN NAD Head: NCAT sclera not icteric MMM Neck: Supple.  Lungs: CTA bilat Heart: RRR with S1 S2.  Abdomen: soft NT + BS Lower extremities:without edema or ischemic changes, no open wounds  Neuro: A & O  X 3. Moves all extremities spontaneously. Dialysis Access: LUE AVF swollen and bruised, +bruit   Dialysis Orders:  [facility] M[facility] 3:45 BFR 350 2K/2Ca EDW 53.5 kg  Heparin 3000 U IV Bolus Calcitriol 2.25 mcg PO 3x q week Mircera 100 mcg IV q 2 weeks ( last 12/16/16)   Assessment: 1.  S/P revision AVF w pseudoaneursym resection 12/22/16 2.  ESRD -  MWF usual HD.  Had HD  last night, ran 3hrs then infiltrated upper needle.  3.  Hypertension/volume  - BP controlled/cont home meds, up 3kg by wts, asymptomatic 4.  Anemia  - Hgb 8.1 post op - follow on OP ESA - last OP Tsat 18% - give Fe with HD today  5.  Metabolic bone disease -  Cont VDRA/Aurxyia 2 qac 6.  Nutrition - renal diet/vitamins  7.  Hx CVA  Plan - AVF probably needs resting, will d/w VVS   Kelly Splinter MD [facility] pager [phone]   12/23/2016, 10:31 AM    Recent Labs Lab 12/21/16 1735 12/22/16 0310 12/23/16 0427  NA 138 136 133*  K 5.2* 5.1 4.3  CL 93* 93* 95*  CO2 26 25 26   GLUCOSE 219* 104* 126*  BUN 73* 82* 45*  CREATININE 9.73* 10.39* 7.37*  CALCIUM 9.2 8.6* 7.7*  PHOS  --   --  5.1*    Recent Labs Lab 12/23/16 0427  ALBUMIN 2.3*    Recent Labs Lab 12/21/16 1735 12/22/16 0310  WBC 8.2 6.7  HGB 9.1* 8.1*  HCT 28.8* 25.1*  MCV 91.1 90.0  PLT 280 251   Iron/TIBC/Ferritin/ %Sat    Component Value Date/Time   IRON 61 12/03/2012 0904   TIBC 116 (L) 12/03/2012 0904   FERRITIN 1,708 (H) 12/03/2012 0904   IRONPCTSAT 53 12/03/2012 0904  IRONPCTSAT 10 (L) 07/22/2011 1332

## 2016-12-23 NOTE — Progress Notes (Addendum)
  Progress Note    12/23/2016 7:22 AM 1 Day Post-Op  Subjective:  Says she is having pain in her arm   Afebrile HR  80's NSR 751'W-258'N systolic 27% 7OE4MP  Vitals:   12/22/16 2015 12/23/16 0443  BP: 117/71 136/71  Pulse: 81 83  Resp: 18 18  Temp: 98.5 F (36.9 C) 98.3 F (36.8 C)    Physical Exam: Lungs:  Non labored Incisions:  Clean and dry; moderate hematoma at incision site Extremities:  Excellent thrill within fistula   CBC    Component Value Date/Time   WBC 6.7 12/22/2016 0310   RBC 2.79 (L) 12/22/2016 0310   HGB 8.1 (L) 12/22/2016 0310   HGB 8.7 (L) 09/24/2011 1038   HCT 25.1 (L) 12/22/2016 0310   HCT 27.3 (L) 09/24/2011 1038   PLT 251 12/22/2016 0310   PLT 209 09/24/2011 1038   MCV 90.0 12/22/2016 0310   MCV 94.5 09/24/2011 1038   MCH 29.0 12/22/2016 0310   MCHC 32.3 12/22/2016 0310   RDW 18.0 (H) 12/22/2016 0310   RDW 20.1 (H) 09/24/2011 1038   LYMPHSABS 1.9 06/24/2016 0135   LYMPHSABS 1.1 09/24/2011 1038   MONOABS 0.4 06/24/2016 0135   MONOABS 0.6 09/24/2011 1038   EOSABS 0.2 06/24/2016 0135   EOSABS 0.1 09/24/2011 1038   BASOSABS 0.0 06/24/2016 0135   BASOSABS 0.0 09/24/2011 1038    BMET    Component Value Date/Time   NA 133 (L) 12/23/2016 0427   K 4.3 12/23/2016 0427   CL 95 (L) 12/23/2016 0427   CO2 26 12/23/2016 0427   GLUCOSE 126 (H) 12/23/2016 0427   BUN 45 (H) 12/23/2016 0427   CREATININE 7.37 (H) 12/23/2016 0427   CALCIUM 7.7 (L) 12/23/2016 0427   CALCIUM 9.6 08/03/2011 1005   GFRNONAA 5 (L) 12/23/2016 0427   GFRAA 6 (L) 12/23/2016 0427    INR    Component Value Date/Time   INR 1.13 11/19/2015 0926     Intake/Output Summary (Last 24 hours) at 12/23/16 0722 Last data filed at 12/23/16 0653  Gross per 24 hour  Intake              710 ml  Output             2493 ml  Net            -1783 ml     Assessment:  66 y.o. female is s/p:  revision of left arm arteriovenous fistula with excision of pseudoaneurysm  1 Day  Post-Op  Plan: -fistula with excellent thrill and she was able to dialyze after surgery  -she does have a hematoma at the incision site.  RN states it is unchanged from 7pm last evening    Leontine Locket, PA-C [facility] [phone] 12/23/2016 7:22 AM  I have independently interviewed patient and agree with PA assessment and plan above. She does have a hematoma but is able to dialyze. Should be ok for discharge, if has difficulty with access would need evacuation of hematoma but has eaten this a.m already.  Nyisha Clippard C. Donzetta Matters, MD [facility] [place] Office: [phone] Pager: [phone]

## 2016-12-24 ENCOUNTER — Encounter (HOSPITAL_COMMUNITY): Payer: Self-pay | Admitting: Critical Care Medicine

## 2016-12-24 ENCOUNTER — Inpatient Hospital Stay (HOSPITAL_COMMUNITY): Payer: Medicare Other

## 2016-12-24 ENCOUNTER — Encounter (HOSPITAL_COMMUNITY)
Admission: EM | Disposition: A | Discharge status: Home / Self Care | Payer: Self-pay | Source: Home / Self Care | Attending: Vascular Surgery

## 2016-12-24 ENCOUNTER — Inpatient Hospital Stay (HOSPITAL_COMMUNITY): Payer: Medicare Other | Admitting: Critical Care Medicine

## 2016-12-24 DIAGNOSIS — E440 Moderate protein-calorie malnutrition: Secondary | ICD-10-CM | POA: Insufficient documentation

## 2016-12-24 HISTORY — PX: INSERTION OF DIALYSIS CATHETER: SHX1324

## 2016-12-24 HISTORY — PX: HEMATOMA EVACUATION: SHX5118

## 2016-12-24 LAB — CBC
HEMATOCRIT: 23.7 % — AB (ref 36.0–46.0)
Hemoglobin: 7.7 g/dL — ABNORMAL LOW (ref 12.0–15.0)
MCH: 29.4 pg (ref 26.0–34.0)
MCHC: 32.5 g/dL (ref 30.0–36.0)
MCV: 90.5 fL (ref 78.0–100.0)
Platelets: 254 10*3/uL (ref 150–400)
RBC: 2.62 MIL/uL — ABNORMAL LOW (ref 3.87–5.11)
RDW: 17.7 % — ABNORMAL HIGH (ref 11.5–15.5)
WBC: 6.7 10*3/uL (ref 4.0–10.5)

## 2016-12-24 LAB — BASIC METABOLIC PANEL
ANION GAP: 15 (ref 5–15)
BUN: 54 mg/dL — AB (ref 6–20)
CO2: 25 mmol/L (ref 22–32)
Calcium: 7.7 mg/dL — ABNORMAL LOW (ref 8.9–10.3)
Chloride: 91 mmol/L — ABNORMAL LOW (ref 101–111)
Creatinine, Ser: 9 mg/dL — ABNORMAL HIGH (ref 0.44–1.00)
GFR calc Af Amer: 5 mL/min — ABNORMAL LOW (ref >60)
GFR, EST NON AFRICAN AMERICAN: 4 mL/min — AB (ref >60)
Glucose, Bld: 102 mg/dL — ABNORMAL HIGH (ref 65–99)
POTASSIUM: 4.7 mmol/L (ref 3.5–5.1)
SODIUM: 131 mmol/L — AB (ref 135–145)

## 2016-12-24 LAB — SURGICAL PCR SCREEN
MRSA, PCR: NEGATIVE
STAPHYLOCOCCUS AUREUS: NEGATIVE

## 2016-12-24 SURGERY — INSERTION OF DIALYSIS CATHETER
Anesthesia: General | Site: Neck | Laterality: Right

## 2016-12-24 MED ORDER — MIDAZOLAM HCL 5 MG/5ML IJ SOLN
INTRAMUSCULAR | Status: DC | PRN
  Administered 2016-12-24: 1 mg via INTRAVENOUS

## 2016-12-24 MED ORDER — SODIUM CHLORIDE 0.9 % IV SOLN
INTRAVENOUS | Status: DC | PRN
  Administered 2016-12-24: 08:00:00

## 2016-12-24 MED ORDER — HYDROMORPHONE HCL 1 MG/ML IJ SOLN: 0.2500 mg | INTRAMUSCULAR | Status: DC | PRN

## 2016-12-24 MED ORDER — CALCITRIOL 0.25 MCG PO CAPS
ORAL_CAPSULE | ORAL | Status: AC
  Filled 2016-12-24: qty 1

## 2016-12-24 MED ORDER — HEPARIN SODIUM (PORCINE) 1000 UNIT/ML DIALYSIS: 1000.0000 [IU] | INTRAMUSCULAR | Status: DC | PRN

## 2016-12-24 MED ORDER — DIPHENHYDRAMINE HCL 25 MG PO CAPS
ORAL_CAPSULE | ORAL | Status: AC
  Filled 2016-12-24: qty 1

## 2016-12-24 MED ORDER — FENTANYL CITRATE (PF) 100 MCG/2ML IJ SOLN
INTRAMUSCULAR | Status: DC | PRN
  Administered 2016-12-24 (×2): 25 ug via INTRAVENOUS

## 2016-12-24 MED ORDER — MORPHINE SULFATE (PF) 4 MG/ML IV SOLN
INTRAVENOUS | Status: AC
  Administered 2016-12-24: 16:00:00
  Filled 2016-12-24: qty 1

## 2016-12-24 MED ORDER — MIDAZOLAM HCL 2 MG/2ML IJ SOLN
INTRAMUSCULAR | Status: AC
  Filled 2016-12-24: qty 2

## 2016-12-24 MED ORDER — PROPOFOL 10 MG/ML IV BOLUS
INTRAVENOUS | Status: AC
  Filled 2016-12-24: qty 20

## 2016-12-24 MED ORDER — LIDOCAINE-EPINEPHRINE (PF) 1 %-1:200000 IJ SOLN
INTRAMUSCULAR | Status: AC
  Filled 2016-12-24: qty 30

## 2016-12-24 MED ORDER — PHENYLEPHRINE 40 MCG/ML (10ML) SYRINGE FOR IV PUSH (FOR BLOOD PRESSURE SUPPORT)
PREFILLED_SYRINGE | INTRAVENOUS | Status: AC
  Filled 2016-12-24: qty 10

## 2016-12-24 MED ORDER — LIDOCAINE HCL (PF) 1 % IJ SOLN
5.0000 mL | INTRAMUSCULAR | Status: DC | PRN
Start: 2016-12-24 — End: 2016-12-24

## 2016-12-24 MED ORDER — PHENYLEPHRINE 40 MCG/ML (10ML) SYRINGE FOR IV PUSH (FOR BLOOD PRESSURE SUPPORT)
PREFILLED_SYRINGE | INTRAVENOUS | Status: DC | PRN
  Administered 2016-12-24: 40 ug via INTRAVENOUS
  Administered 2016-12-24: 120 ug via INTRAVENOUS
  Administered 2016-12-24 (×2): 80 ug via INTRAVENOUS

## 2016-12-24 MED ORDER — ALTEPLASE 2 MG IJ SOLR: 2.0000 mg | Freq: Once | INTRAMUSCULAR | Status: DC | PRN

## 2016-12-24 MED ORDER — SODIUM CHLORIDE 0.9 % IV SOLN: 100.0000 mL | INTRAVENOUS | Status: DC | PRN

## 2016-12-24 MED ORDER — LIDOCAINE-PRILOCAINE 2.5-2.5 % EX CREA: 1.0000 "application " | TOPICAL_CREAM | CUTANEOUS | Status: DC | PRN

## 2016-12-24 MED ORDER — LIDOCAINE 2% (20 MG/ML) 5 ML SYRINGE
INTRAMUSCULAR | Status: DC | PRN
  Administered 2016-12-24 (×2): 50 mg via INTRAVENOUS

## 2016-12-24 MED ORDER — PROPOFOL 10 MG/ML IV BOLUS
INTRAVENOUS | Status: DC | PRN
  Administered 2016-12-24: 20 mg via INTRAVENOUS
  Administered 2016-12-24: 120 mg via INTRAVENOUS

## 2016-12-24 MED ORDER — DARBEPOETIN ALFA 60 MCG/0.3ML IJ SOSY
60.0000 ug | PREFILLED_SYRINGE | Freq: Once | INTRAMUSCULAR | Status: AC
  Administered 2016-12-24: 60 ug via INTRAVENOUS

## 2016-12-24 MED ORDER — HEPARIN SODIUM (PORCINE) 1000 UNIT/ML IJ SOLN
INTRAMUSCULAR | Status: DC | PRN
  Administered 2016-12-24: 1000 [IU] via INTRAVENOUS

## 2016-12-24 MED ORDER — DARBEPOETIN ALFA 60 MCG/0.3ML IJ SOSY
PREFILLED_SYRINGE | INTRAMUSCULAR | Status: AC
  Administered 2016-12-24: 60 ug via INTRAVENOUS
  Filled 2016-12-24: qty 0.3

## 2016-12-24 MED ORDER — SODIUM CHLORIDE 0.9 % IV SOLN
100.0000 mL | INTRAVENOUS | Status: DC | PRN
Start: 2016-12-24 — End: 2016-12-24

## 2016-12-24 MED ORDER — PHENYLEPHRINE HCL 10 MG/ML IJ SOLN
INTRAVENOUS | Status: DC | PRN
  Administered 2016-12-24: 10 ug/min via INTRAVENOUS

## 2016-12-24 MED ORDER — HEPARIN SODIUM (PORCINE) 1000 UNIT/ML IJ SOLN
INTRAMUSCULAR | Status: AC
  Filled 2016-12-24: qty 1

## 2016-12-24 MED ORDER — SODIUM CHLORIDE 0.9 % IV SOLN
INTRAVENOUS | Status: DC | PRN
  Administered 2016-12-24: 07:00:00 via INTRAVENOUS

## 2016-12-24 MED ORDER — CALCITRIOL 0.5 MCG PO CAPS
ORAL_CAPSULE | ORAL | Status: AC
  Filled 2016-12-24: qty 4

## 2016-12-24 MED ORDER — FENTANYL CITRATE (PF) 100 MCG/2ML IJ SOLN
INTRAMUSCULAR | Status: AC
  Filled 2016-12-24: qty 2

## 2016-12-24 MED ORDER — IOPAMIDOL (ISOVUE-300) INJECTION 61%
INTRAVENOUS | Status: AC
  Filled 2016-12-24: qty 50

## 2016-12-24 MED ORDER — ONDANSETRON HCL 4 MG/2ML IJ SOLN
INTRAMUSCULAR | Status: DC | PRN
  Administered 2016-12-24: 4 mg via INTRAVENOUS

## 2016-12-24 MED ORDER — 0.9 % SODIUM CHLORIDE (POUR BTL) OPTIME
TOPICAL | Status: DC | PRN
  Administered 2016-12-24: 1000 mL

## 2016-12-24 MED ORDER — PENTAFLUOROPROP-TETRAFLUOROETH EX AERO
1.0000 | INHALATION_SPRAY | CUTANEOUS | Status: DC | PRN
Start: 2016-12-24 — End: 2016-12-24

## 2016-12-24 SURGICAL SUPPLY — 53 items
ADH SKN CLS APL DERMABOND .7 (GAUZE/BANDAGES/DRESSINGS) ×2
BAG DECANTER FOR FLEXI CONT (MISCELLANEOUS) ×3 IMPLANT
BANDAGE ELASTIC 4 VELCRO ST LF (GAUZE/BANDAGES/DRESSINGS) ×1 IMPLANT
BIOPATCH RED 1 DISK 7.0 (GAUZE/BANDAGES/DRESSINGS) ×3 IMPLANT
BIOPATCH WHT 1IN DISK W/4.0 H (GAUZE/BANDAGES/DRESSINGS) ×1 IMPLANT
CATH PALINDROME RT-P 15FX19CM (CATHETERS) ×1 IMPLANT
CATH PALINDROME RT-P 15FX23CM (CATHETERS) IMPLANT
CATH PALINDROME RT-P 15FX28CM (CATHETERS) IMPLANT
CATH PALINDROME RT-P 15FX55CM (CATHETERS) IMPLANT
COVER PROBE W GEL 5X96 (DRAPES) ×3 IMPLANT
COVER SURGICAL LIGHT HANDLE (MISCELLANEOUS) ×5 IMPLANT
DERMABOND ADVANCED (GAUZE/BANDAGES/DRESSINGS) ×1
DERMABOND ADVANCED .7 DNX12 (GAUZE/BANDAGES/DRESSINGS) ×2 IMPLANT
DRAPE C-ARM 42X72 X-RAY (DRAPES) ×3 IMPLANT
DRAPE CHEST BREAST 15X10 FENES (DRAPES) ×3 IMPLANT
GAUZE SPONGE 2X2 8PLY NS (GAUZE/BANDAGES/DRESSINGS) ×1 IMPLANT
GAUZE SPONGE 2X2 8PLY STRL LF (GAUZE/BANDAGES/DRESSINGS) IMPLANT
GAUZE SPONGE 4X4 16PLY XRAY LF (GAUZE/BANDAGES/DRESSINGS) ×3 IMPLANT
GLOVE BIO SURGEON STRL SZ 6.5 (GLOVE) ×2 IMPLANT
GLOVE BIO SURGEON STRL SZ7.5 (GLOVE) ×3 IMPLANT
GLOVE BIOGEL PI IND STRL 6.5 (GLOVE) IMPLANT
GLOVE BIOGEL PI INDICATOR 6.5 (GLOVE) ×4
GLOVE ECLIPSE 6.5 STRL STRAW (GLOVE) ×1 IMPLANT
GLOVE SURG SS PI 6.5 STRL IVOR (GLOVE) ×2 IMPLANT
GOWN STRL REUS W/ TWL LRG LVL3 (GOWN DISPOSABLE) ×2 IMPLANT
GOWN STRL REUS W/ TWL XL LVL3 (GOWN DISPOSABLE) ×2 IMPLANT
GOWN STRL REUS W/TWL LRG LVL3 (GOWN DISPOSABLE) ×12
GOWN STRL REUS W/TWL XL LVL3 (GOWN DISPOSABLE) ×6
KIT BASIN OR (CUSTOM PROCEDURE TRAY) ×3 IMPLANT
KIT ROOM TURNOVER OR (KITS) ×3 IMPLANT
NDL 18GX1X1/2 (RX/OR ONLY) (NEEDLE) ×2 IMPLANT
NDL HYPO 25GX1X1/2 BEV (NEEDLE) ×2 IMPLANT
NEEDLE 18GX1X1/2 (RX/OR ONLY) (NEEDLE) ×3 IMPLANT
NEEDLE HYPO 25GX1X1/2 BEV (NEEDLE) ×3 IMPLANT
NS IRRIG 1000ML POUR BTL (IV SOLUTION) ×3 IMPLANT
PACK CV ACCESS (CUSTOM PROCEDURE TRAY) ×1 IMPLANT
PACK SURGICAL SETUP 50X90 (CUSTOM PROCEDURE TRAY) ×3 IMPLANT
PAD ARMBOARD 7.5X6 YLW CONV (MISCELLANEOUS) ×6 IMPLANT
SOAP 2 % CHG 4 OZ (WOUND CARE) ×3 IMPLANT
SPONGE GAUZE 2X2 STER 10/PKG (GAUZE/BANDAGES/DRESSINGS)
SPONGE GAUZE 4X4 12PLY STER LF (GAUZE/BANDAGES/DRESSINGS) ×1 IMPLANT
SUT ETHILON 3 0 PS 1 (SUTURE) ×4 IMPLANT
SUT MNCRL AB 4-0 PS2 18 (SUTURE) ×3 IMPLANT
SUT PROLENE 4 0 RB 1 (SUTURE) ×3
SUT PROLENE 4-0 RB1 .5 CRCL 36 (SUTURE) IMPLANT
SYR 20CC LL (SYRINGE) ×6 IMPLANT
SYR 5ML LL (SYRINGE) ×3 IMPLANT
SYR CONTROL 10ML LL (SYRINGE) ×3 IMPLANT
SYRINGE 10CC LL (SYRINGE) ×3 IMPLANT
TOWEL OR 17X24 6PK STRL BLUE (TOWEL DISPOSABLE) ×3 IMPLANT
TOWEL OR 17X26 4PK STRL BLUE (TOWEL DISPOSABLE) ×2 IMPLANT
WATER STERILE IRR 1000ML POUR (IV SOLUTION) ×3 IMPLANT
WIRE AMPLATZ SS-J .035X180CM (WIRE) ×1 IMPLANT

## 2016-12-24 NOTE — Op Note (Signed)
Patient name: [name] MRN: [id number] DOB: May 01, 1951 Sex: female  12/24/2016 Pre-operative Diagnosis: esrd, hematoma of left arm  Post-operative diagnosis:  Same Surgeon:  Luanna Salk. Randie Heinz, MD Assistant: Doreatha Massed, PA Procedure Performed: 1.  US guided cannulation of right internal jugular vein 2.  Placement of 19cm tunneled dialysis catheter 3.  Evacuation of left arm hematoma  Indications:  67 year old female with a recent history of a revision of her left upper extremity AV fistula for ulceration and bleeding. Postoperatively she developed a hematoma and requires now catheter placement and evacuation of the hematoma.  Findings: There are multiple collaterals in the right neck. We were able to get access into the internal jugular system using micropuncture sheat in the left arm there was significant hematoma that was evacuated without any evidence of bleeding. We did oversew one area on the fistula that had continuous ooze.Marland Kitchen    Procedure:  The patient was identified in the holding area and taken to the operating room where she is placed supine on the operating table general anesthesia was induced she was given antibiotics sterilely prepped and draped in the right neck and chest and timeout called. We began with ultrasound-guided evaluation of the right IJ and there were multiple collaterals. I initially attempted to stick the vein with an 18-gauge needle this resulted in arterial stick and pressure was held for a period of 5 minutes without any hematoma. I then used a micropuncture needle to cannulate the internal jugular vein and using a wire was able to manipulated into the right atrium. I exchanged for micropuncture sheath and then placed a Amplatz wire into the inferior vena cava from there. I then tunneled a 19 cm tunneled catheter. Serial dilation over the Amplatz wire was performed and the introducer sheath placed under fluoroscopic guidance. I then introduced the catheter  into the introducer sheath and terminated in the superior vena cava. Fluoroscopy demonstrated smooth curve of the catheter. It flushed and withdrew easily. It was flushed with heparin and capped. Neck incision was closed with 4-0 Monocryl and the catheter was sutured in place with a 3-0 nylon suture.  Transfer taken down and she was then sterilely prepped and draped in the left arm and a new timeout called. 10 blade was used to open the previous incision sutures were removed with scissors. We did encounter approximately 100 mL of hematoma without any evidence of bleeding. There was one area of ooze from our previously repair site of the fistula which was oversewn with 4-0 Prolene suture. This did obtain hemostasis. We irrigated the wound and closed with 3-0 nylon sutures in a mattress configuration. The arm was wrapped with Ace wraps patient was allowed awaken from anesthesia having tolerated procedure well and will get dialysis today via her catheter.  Blood loss 10 mL.  Complications none immediate.   [name] Randie Heinz, MD [facility] [place] Office: [phone] Pager: [phone]

## 2016-12-24 NOTE — Anesthesia Preprocedure Evaluation (Addendum)
Anesthesia Evaluation  Patient identified by MRN, date of birth, ID band Patient awake    Reviewed: Allergy & Precautions, NPO status   Airway Mallampati: II  TM Distance: >3 FB     Dental   Pulmonary shortness of breath, pneumonia, former smoker,    breath sounds clear to auscultation       Cardiovascular hypertension, + Cardiac Stents and +CHF   Rhythm:Regular     Neuro/Psych    GI/Hepatic Neg liver ROS, GERD  ,  Endo/Other  negative endocrine ROS  Renal/GU Renal disease     Musculoskeletal  (+) Arthritis ,   Abdominal   Peds  Hematology  (+) anemia ,   Anesthesia Other Findings   Reproductive/Obstetrics                            Anesthesia Physical Anesthesia Plan  ASA: III  Anesthesia Plan: General   Post-op Pain Management:    Induction: Intravenous  Airway Management Planned: LMA  Additional Equipment:   Intra-op Plan:   Post-operative Plan: Extubation in OR  Informed Consent: I have reviewed the patients History and Physical, chart, labs and discussed the procedure including the risks, benefits and alternatives for the proposed anesthesia with the patient or authorized representative who has indicated his/her understanding and acceptance.   Dental advisory given  Plan Discussed with: CRNA and Anesthesiologist  Anesthesia Plan Comments:         Anesthesia Quick Evaluation

## 2016-12-24 NOTE — Anesthesia Procedure Notes (Signed)
Procedure Name: LMA Insertion Date/Time: 12/24/2016 7:32 AM Performed by: Merrilyn Puma B Pre-anesthesia Checklist: Patient identified, Emergency Drugs available, Suction available, Patient being monitored and Timeout performed Patient Re-evaluated:Patient Re-evaluated prior to inductionOxygen Delivery Method: Circle system utilized Preoxygenation: Pre-oxygenation with 100% oxygen Intubation Type: IV induction LMA: LMA inserted LMA Size: 4.0 Number of attempts: 1 Placement Confirmation: positive ETCO2 and breath sounds checked- equal and bilateral Tube secured with: Tape Dental Injury: Teeth and Oropharynx as per pre-operative assessment

## 2016-12-24 NOTE — Progress Notes (Signed)
  Progress Note    12/24/2016 7:22 AM Day of Surgery  Subjective:  Stable pain in left arm  Vitals:   12/23/16 2003 12/24/16 0556  BP: 126/89 123/68  Pulse: 83 79  Resp: 18 18  Temp: 97.6 F (36.4 C) 98.2 F (36.8 C)    Physical Exam: aaox3 Sinus rhythm Non labored breathing Left arm with hematoma, palpable radial pulse   CBC    Component Value Date/Time   WBC 6.7 12/24/2016 0334   RBC 2.62 (L) 12/24/2016 0334   HGB 7.7 (L) 12/24/2016 0334   HGB 8.7 (L) 09/24/2011 1038   HCT 23.7 (L) 12/24/2016 0334   HCT 27.3 (L) 09/24/2011 1038   PLT 254 12/24/2016 0334   PLT 209 09/24/2011 1038   MCV 90.5 12/24/2016 0334   MCV 94.5 09/24/2011 1038   MCH 29.4 12/24/2016 0334   MCHC 32.5 12/24/2016 0334   RDW 17.7 (H) 12/24/2016 0334   RDW 20.1 (H) 09/24/2011 1038   LYMPHSABS 1.9 06/24/2016 0135   LYMPHSABS 1.1 09/24/2011 1038   MONOABS 0.4 06/24/2016 0135   MONOABS 0.6 09/24/2011 1038   EOSABS 0.2 06/24/2016 0135   EOSABS 0.1 09/24/2011 1038   BASOSABS 0.0 06/24/2016 0135   BASOSABS 0.0 09/24/2011 1038    BMET    Component Value Date/Time   NA 131 (L) 12/24/2016 0334   K 4.7 12/24/2016 0334   CL 91 (L) 12/24/2016 0334   CO2 25 12/24/2016 0334   GLUCOSE 102 (H) 12/24/2016 0334   BUN 54 (H) 12/24/2016 0334   CREATININE 9.00 (H) 12/24/2016 0334   CALCIUM 7.7 (L) 12/24/2016 0334   CALCIUM 9.6 08/03/2011 1005   GFRNONAA 4 (L) 12/24/2016 0334   GFRAA 5 (L) 12/24/2016 0334    INR    Component Value Date/Time   INR 1.13 11/19/2015 0926     Intake/Output Summary (Last 24 hours) at 12/24/16 0722 Last data filed at 12/24/16 0600  Gross per 24 hour  Intake              885 ml  Output                0 ml  Net              885 ml     Assessment:  66 y.o. female is s/p revision of left arm avf now with hematoma.  Plan: Tunneled dialysis catheter and evacuation of left arm hematoma. Discussed risks associated including injury to blood vessels possibly requiring  blood and she agrees to proceed.    Teela Narducci C. Donzetta Matters, MD [facility] [place] Office: [phone] Pager: [phone]  12/24/2016 7:22 AM

## 2016-12-24 NOTE — Progress Notes (Signed)
Patient lying in bed, call light within place, will bring pain med

## 2016-12-24 NOTE — Progress Notes (Signed)
12/24/2016 5:44 PM Notified Leontine Locket of pt requiring IV pain meds.  Orders received to keep pt. [place]

## 2016-12-24 NOTE — Transfer of Care (Signed)
Immediate Anesthesia Transfer of Care Note  Patient: [name]  Procedure(s) Performed: Procedure(s): INSERTION OF DIALYSIS CATHETER (Right) EVACUATION HEMATOMA LEFT UPPER ARM (Left)  Patient Location: PACU  Anesthesia Type:General  Level of Consciousness: awake and alert   Airway & Oxygen Therapy: Patient Spontanous Breathing and Patient connected to nasal cannula oxygen  Post-op Assessment: Report given to RN, Post -op Vital signs reviewed and stable and Patient moving all extremities X 4  Post vital signs: Reviewed and stable  Last Vitals:  Vitals:   12/23/16 2003 12/24/16 0556  BP: 126/89 123/68  Pulse: 83 79  Resp: 18 18  Temp: 36.4 C 36.8 C    Last Pain:  Vitals:   12/24/16 0556  TempSrc: Oral  PainSc:       Patients Stated Pain Goal: 2 (42/10/31 2811)  Complications: No apparent anesthesia complications

## 2016-12-24 NOTE — Progress Notes (Addendum)
Patient being transported to OR via bed. Patient drowsy but arousable and oriented. VSS. Belongings placed with purse in drawer.

## 2016-12-24 NOTE — Progress Notes (Addendum)
[facility] Progress Note   Subjective: no c/o's on HD, doing well.  Had some outburst last night but feels fine today  Objective Vitals:   12/24/16 1400 12/24/16 1430 12/24/16 1500 12/24/16 1530  BP: (!) 127/59 (!) 102/51 (!) 97/54 (!) 102/48  Pulse: 82 80 78 76  Resp: (!) 24 10 16 14   Temp:      TempSrc:      SpO2:      Weight:      Height:       Physical Exam General: Pleasant, WN,WD female in NAD Heart: S1,S2, RRR Lungs: CTAB  Abdomen: soft, nontender Extremities: No LE edema Dialysis Access: LUA AVF ace wrap in place. +bruit heard through wrap RIJ TDC Drsg CDI blood lines connected.    Additional Objective Labs: Basic Metabolic Panel:  Recent Labs Lab 12/22/16 0310 12/23/16 0427 12/24/16 0334  NA 136 133* 131*  K 5.1 4.3 4.7  CL 93* 95* 91*  CO2 25 26 25   GLUCOSE 104* 126* 102*  BUN 82* 45* 54*  CREATININE 10.39* 7.37* 9.00*  CALCIUM 8.6* 7.7* 7.7*  PHOS  --  5.1*  --    Liver Function Tests:  Recent Labs Lab 12/23/16 0427  ALBUMIN 2.3*   CBC:  Recent Labs Lab 12/21/16 1735 12/22/16 0310 12/24/16 0334  WBC 8.2 6.7 6.7  HGB 9.1* 8.1* 7.7*  HCT 28.8* 25.1* 23.7*  MCV 91.1 90.0 90.5  PLT 280 251 254   Blood Culture    Component Value Date/Time   SDES BLOOD RIGHT HAND 11/19/2015 6144   SPECREQUEST  11/19/2015 0937    BOTTLES DRAWN AEROBIC AND ANAEROBIC 10CCS BLUE 5CCS RED   CULT NO GROWTH 5 DAYS 11/19/2015 3154   REPTSTATUS 11/24/2015 FINAL 11/19/2015 0086    Studies/Results: Dg Chest Port 1 View  Result Date: 12/24/2016 CLINICAL DATA:  Hemodialysis catheter insertion EXAM: PORTABLE CHEST 1 VIEW COMPARISON:  May 14, 2016 FINDINGS: Central catheter tip is in the superior vena cava. No pneumothorax. The lungs are mildly hyperexpanded. There is scarring in the left mid lung and right mid lung regions. There is no edema or consolidation. Heart is enlarged with pulmonary vascular within normal limits. There is atherosclerotic  calcification in the aorta as well as calcification in both carotid arteries. No evident adenopathy. There is postoperative change in the lower cervical spine region. IMPRESSION: Central catheter tip in superior vena cava. No pneumothorax. Stable cardiomegaly. No edema or consolidation. Aortic atherosclerosis as well as calcification noted in each carotid artery. Electronically Signed   By: Lowella Grip III M.D.   On: 12/24/2016 09:43   Dg Fluoro Guide Cv Line-no Report  Result Date: 12/24/2016 There is no Radiologist interpretation  for this exam.  Medications:  . allopurinol  150 mg Oral QHS  . aspirin EC  325 mg Oral QHS  . calcitRIOL      . calcitRIOL      . cinacalcet  30 mg Oral Q supper  . diphenhydrAMINE      . doxazosin  8 mg Oral QHS  . feeding supplement (NEPRO CARB STEADY)  237 mL Oral BID BM  . ferric citrate  420 mg Oral TID WC  . labetalol  100 mg Oral BID  . losartan  100 mg Oral QHS  . multivitamin  1 tablet Oral QHS  . pantoprazole  40 mg Oral Daily  . simvastatin  10 mg Oral QHS  . sodium chloride flush  3 mL Intravenous Q12H  Dialysis Orders:  [facility] M[facility] 3:45 BFR 350 2K/2Ca EDW 53.5 kg  Heparin 3000 U IV Bolus Calcitriol 2.25 mcg PO 3x q week Mircera 100 mcg IV q 2 weeks ( last 12/16/16)   Assessment/Plan: 1. S/P revision AVF w pseudoaneursym resection 12/22/16, evacuation of L arm hematoma   with placement of [facility] 12/24/16 2. ESRD- MWF HD. HD today 3. Hypertension/volume- up 4kg today, UF as tol 4. Anemia- Hgb 7.7 today. Rec'd OP ESA dose 12/16/2016. Re-dose ESA today. Aranesp 60 mcg IV today. Follow HGB.    5. Metabolic bone disease- Cont VDRA/Aurxyia 2 qac 6. Nutrition- renal diet/vitamins  7. Hx CVA   Kelly Splinter MD [facility] pager [phone]   12/25/2016, 8:43 AM

## 2016-12-24 NOTE — Anesthesia Postprocedure Evaluation (Signed)
Anesthesia Post Note  Patient: [name]  Procedure(s) Performed: Procedure(s) (LRB): INSERTION OF DIALYSIS CATHETER (Right) EVACUATION HEMATOMA LEFT UPPER ARM (Left)  Patient location during evaluation: PACU Anesthesia Type: General Level of consciousness: awake Pain management: pain level controlled Vital Signs Assessment: post-procedure vital signs reviewed and stable Respiratory status: spontaneous breathing Cardiovascular status: stable Anesthetic complications: no       Last Vitals:  Vitals:   12/24/16 0556 12/24/16 0905  BP: 123/68 120/76  Pulse: 79 82  Resp: 18 13  Temp: 36.8 C 36.5 C    Last Pain:  Vitals:   12/24/16 0905  TempSrc:   PainSc: Asleep                 Caela Huot

## 2016-12-25 ENCOUNTER — Encounter (HOSPITAL_COMMUNITY): Payer: Self-pay | Admitting: Vascular Surgery

## 2016-12-25 LAB — RENAL FUNCTION PANEL
ALBUMIN: 2.3 g/dL — AB (ref 3.5–5.0)
ANION GAP: 12 (ref 5–15)
BUN: 17 mg/dL (ref 6–20)
CALCIUM: 8.1 mg/dL — AB (ref 8.9–10.3)
CO2: 27 mmol/L (ref 22–32)
Chloride: 92 mmol/L — ABNORMAL LOW (ref 101–111)
Creatinine, Ser: 3.62 mg/dL — ABNORMAL HIGH (ref 0.44–1.00)
GFR calc non Af Amer: 12 mL/min — ABNORMAL LOW (ref >60)
GFR, EST AFRICAN AMERICAN: 14 mL/min — AB (ref >60)
GLUCOSE: 113 mg/dL — AB (ref 65–99)
PHOSPHORUS: 2.9 mg/dL (ref 2.5–4.6)
POTASSIUM: 3.7 mmol/L (ref 3.5–5.1)
SODIUM: 131 mmol/L — AB (ref 135–145)

## 2016-12-25 LAB — CBC
HEMATOCRIT: 24.2 % — AB (ref 36.0–46.0)
HEMOGLOBIN: 7.7 g/dL — AB (ref 12.0–15.0)
MCH: 29.7 pg (ref 26.0–34.0)
MCHC: 31.8 g/dL (ref 30.0–36.0)
MCV: 93.4 fL (ref 78.0–100.0)
Platelets: 243 10*3/uL (ref 150–400)
RBC: 2.59 MIL/uL — AB (ref 3.87–5.11)
RDW: 17.9 % — ABNORMAL HIGH (ref 11.5–15.5)
WBC: 8.1 10*3/uL (ref 4.0–10.5)

## 2016-12-25 NOTE — Care Management Important Message (Signed)
Important Message  Patient Details  Name: [name] MRN: [id number] Date of Birth: 08/03/1951   Medicare Important Message Given:  Yes    Nathen May 12/25/2016, 12:30 PM

## 2016-12-25 NOTE — Progress Notes (Addendum)
  Progress Note    12/25/2016 7:33 AM 1 Day Post-Op  Subjective:  C/o pain in her arm  Tm 98.9 681'L-572'I systolic HR 20'B-55'H  74% RA  Vitals:   12/24/16 2027 12/25/16 0532  BP: 126/63 (!) 118/48  Pulse: 91 96  Resp: 18 16  Temp: 98.2 F (36.8 C) 98.9 F (37.2 C)    Physical Exam: Lungs:  Non labored Incisions:   Clean with mild bloody ooze from mid incision; moderate hematoma Extremities:  Motor and sensation in tact left hand; +excellent thrill within fistula   CBC    Component Value Date/Time   WBC 6.7 12/24/2016 0334   RBC 2.62 (L) 12/24/2016 0334   HGB 7.7 (L) 12/24/2016 0334   HGB 8.7 (L) 09/24/2011 1038   HCT 23.7 (L) 12/24/2016 0334   HCT 27.3 (L) 09/24/2011 1038   PLT 254 12/24/2016 0334   PLT 209 09/24/2011 1038   MCV 90.5 12/24/2016 0334   MCV 94.5 09/24/2011 1038   MCH 29.4 12/24/2016 0334   MCHC 32.5 12/24/2016 0334   RDW 17.7 (H) 12/24/2016 0334   RDW 20.1 (H) 09/24/2011 1038   LYMPHSABS 1.9 06/24/2016 0135   LYMPHSABS 1.1 09/24/2011 1038   MONOABS 0.4 06/24/2016 0135   MONOABS 0.6 09/24/2011 1038   EOSABS 0.2 06/24/2016 0135   EOSABS 0.1 09/24/2011 1038   BASOSABS 0.0 06/24/2016 0135   BASOSABS 0.0 09/24/2011 1038    BMET    Component Value Date/Time   NA 131 (L) 12/24/2016 0334   K 4.7 12/24/2016 0334   CL 91 (L) 12/24/2016 0334   CO2 25 12/24/2016 0334   GLUCOSE 102 (H) 12/24/2016 0334   BUN 54 (H) 12/24/2016 0334   CREATININE 9.00 (H) 12/24/2016 0334   CALCIUM 7.7 (L) 12/24/2016 0334   CALCIUM 9.6 08/03/2011 1005   GFRNONAA 4 (L) 12/24/2016 0334   GFRAA 5 (L) 12/24/2016 0334    INR    Component Value Date/Time   INR 1.13 11/19/2015 0926     Intake/Output Summary (Last 24 hours) at 12/25/16 0733 Last data filed at 12/24/16 1639  Gross per 24 hour  Intake              300 ml  Output             3050 ml  Net            -2750 ml     Assessment:  66 y.o. female is s/p:  revision of left arm arteriovenous fistula  with excision of pseudoaneurysm POD 3 and 1.  US guided cannulation of right internal jugular vein 2.  Placement of 19cm tunneled dialysis catheter 3.  Evacuation of left arm hematoma  1 Day Post-Op  Plan: -pt required IV pain medication last evening -she has moderate hematoma in the space -check CBC this am-may need a unit of PRBC's on HD this am -excellent thrill within fistula -for a short HD run today to get back on schedule -will place ace wrap back for mild pressure dressing -DVT prophylaxis:  None at this time for risk of bleeding   Leontine Locket, PA-C [facility] [phone] 12/25/2016 7:33 AM  I have independently interviewed patient and agree with PA assessment and plan above. Can d/c after dialysis today vs tomorrow. Will have f/u in 2 weeks.   Jacaden Forbush C. Donzetta Matters, MD [facility] [place] Office: [phone] Pager: [phone]

## 2016-12-26 MED ORDER — DOXAZOSIN MESYLATE 8 MG PO TABS: 8.0000 mg | ORAL_TABLET | Freq: Every day | ORAL | Status: DC

## 2016-12-26 MED ORDER — PENTAFLUOROPROP-TETRAFLUOROETH EX AERO: 1.0000 "application " | INHALATION_SPRAY | CUTANEOUS | Status: DC | PRN

## 2016-12-26 MED ORDER — SODIUM CHLORIDE 0.9 % IV SOLN: 100.0000 mL | INTRAVENOUS | Status: DC | PRN

## 2016-12-26 MED ORDER — SODIUM CHLORIDE 0.9 % IV SOLN: Freq: Once | INTRAVENOUS | Status: DC

## 2016-12-26 MED ORDER — LOSARTAN POTASSIUM 50 MG PO TABS: 50.0000 mg | ORAL_TABLET | Freq: Every day | ORAL | Status: DC

## 2016-12-26 MED ORDER — LIDOCAINE-PRILOCAINE 2.5-2.5 % EX CREA: 1.0000 "application " | TOPICAL_CREAM | CUTANEOUS | Status: DC | PRN

## 2016-12-26 MED ORDER — HEPARIN SODIUM (PORCINE) 1000 UNIT/ML DIALYSIS: 1000.0000 [IU] | INTRAMUSCULAR | Status: DC | PRN

## 2016-12-26 MED ORDER — LIDOCAINE HCL (PF) 1 % IJ SOLN: 5.0000 mL | INTRAMUSCULAR | Status: DC | PRN

## 2016-12-26 MED ORDER — ALTEPLASE 2 MG IJ SOLR: 2.0000 mg | Freq: Once | INTRAMUSCULAR | Status: DC | PRN

## 2016-12-26 NOTE — Progress Notes (Addendum)
  [facility] Progress Note  Subjective  - POD #2  No complaints. Ready to go home.  Objective Vitals:   12/25/16 1942 12/26/16 0505  BP: (!) 115/56 (!) 124/59  Pulse: 81 80  Resp: 18 18  Temp: 97.7 F (36.5 C) 97.7 F (36.5 C)    Intake/Output Summary (Last 24 hours) at 12/26/16 0830 Last data filed at 12/25/16 0858  Gross per 24 hour  Intake              120 ml  Output                0 ml  Net              120 ml    Left upper arm with mild swelling. No active drainage. Some old serosanguinous drainage on dressing. Palpable thrill left upper arm fistula. 2+ left radial pulse.  Assessment/Planning: 66 y.o. female is s/p: evacuation left arm hematoma, placement of right IJ TDC 2 Days Post-Op   Left upper arm fistula with mild swelling.  Continue to rest fistula. F/u in 2 weeks for suture removal. D/c home today.   Alvia Grove 12/26/2016 8:30 AM --  Laboratory CBC    Component Value Date/Time   WBC 8.1 12/25/2016 0830   HGB 7.7 (L) 12/25/2016 0830   HGB 8.7 (L) 09/24/2011 1038   HCT 24.2 (L) 12/25/2016 0830   HCT 27.3 (L) 09/24/2011 1038   PLT 243 12/25/2016 0830   PLT 209 09/24/2011 1038    BMET    Component Value Date/Time   NA 131 (L) 12/25/2016 1345   K 3.7 12/25/2016 1345   CL 92 (L) 12/25/2016 1345   CO2 27 12/25/2016 1345   GLUCOSE 113 (H) 12/25/2016 1345   BUN 17 12/25/2016 1345   CREATININE 3.62 (H) 12/25/2016 1345   CALCIUM 8.1 (L) 12/25/2016 1345   CALCIUM 9.6 08/03/2011 1005   GFRNONAA 12 (L) 12/25/2016 1345   GFRAA 14 (L) 12/25/2016 1345    COAG Lab Results  Component Value Date   INR 1.13 11/19/2015   INR 1.06 11/16/2015   INR 1.18 11/29/2012   No results found for: PTT  Antibiotics Anti-infectives    Start     Dose/Rate Route Frequency Ordered Stop   12/24/16 0800  ceFAZolin (ANCEF) IVPB 1 g/50 mL premix    Comments:  Send with pt to OR   1 g 100 mL/hr over 30 Minutes Intravenous On call 12/23/16  1139 12/24/16 0735   12/22/16 0800  cefUROXime (ZINACEF) 1.5 g in dextrose 5 % 50 mL IVPB     1.5 g 100 mL/hr over 30 Minutes Intravenous To [facility] 12/21/16 2240 12/22/16 [place], PA-C [facility] Office: [phone] Pager: [phone] 12/26/2016 8:30 AM   I have independently interviewed patient and agree with PA assessment and plan above. Will get dialysis today and then d/c home.   Brandon C. Donzetta Matters, MD [facility] [place] Office: [phone] Pager: [phone]

## 2016-12-26 NOTE — Progress Notes (Signed)
Patient arrived to unit by bed.  Reviewed treatment plan and this RN agrees with plan.  Report received from bedside RN, Vincente Liberty.  Consent verified.  Patient A & O X 4.   Lung sounds clear to ausculation in all fields. Generalized  edema. Cardiac:  NSR.  Removed caps and cleansed RIJ catheter with chlorhedxidine.  Aspirated ports of heparin and flushed them with saline per protocol.  Connected and secured lines, initiated treatment at 1107.  UF Goal of 2546mL and net fluid removal 1L.  Will continue to monitor.

## 2016-12-26 NOTE — Progress Notes (Signed)
[name] to be D/C'd Home per MD order. Discussed with the patient and all questions fully answered.  Allergies as of 12/26/2016      Reactions   Sulfa Antibiotics Other (See Comments)   Both parents allergic-so will not take      Medication List    TAKE these medications   acetaminophen 325 MG tablet Commonly known as:  TYLENOL Take 650 mg by mouth every 6 (six) hours as needed for mild pain.   acetaminophen-codeine 300-30 MG tablet Commonly known as:  TYLENOL #3 Take 1-2 tablets by mouth every 6 (six) hours as needed for moderate pain.   allopurinol 300 MG tablet Commonly known as:  ZYLOPRIM Take 150 mg by mouth at bedtime.   ALPRAZolam 0.25 MG tablet Commonly known as:  XANAX Take 0.25 mg by mouth 2 (two) times daily as needed for anxiety.   aspirin EC 325 MG tablet Take 325 mg by mouth at bedtime.   DIALYVITE PO Take 1 tablet by mouth daily.   doxazosin 8 MG tablet Commonly known as:  CARDURA Take 1 tablet (8 mg total) by mouth at bedtime.   labetalol 200 MG tablet Commonly known as:  NORMODYNE Take 200 mg by mouth 2 (two) times daily.   lactulose 10 GM/15ML solution Commonly known as:  CHRONULAC Take 20 g by mouth daily as needed for mild constipation.   losartan 100 MG tablet Commonly known as:  COZAAR Take 100 mg by mouth at bedtime.   meclizine 25 MG tablet Commonly known as:  ANTIVERT Take 1 tablet (25 mg total) by mouth 2 (two) times daily as needed for dizziness.   oxyCODONE-acetaminophen 5-325 MG tablet Commonly known as:  ROXICET Take 1 tablet by mouth every 6 (six) hours as needed for severe pain.   promethazine 25 MG tablet Commonly known as:  PHENERGAN Take 25 mg by mouth every 6 (six) hours as needed for nausea or vomiting.   SENSIPAR 30 MG tablet Generic drug:  cinacalcet Take 30 mg by mouth at bedtime.   simvastatin 10 MG tablet Commonly known as:  ZOCOR Take 10 mg by mouth at bedtime.   zolpidem 10 MG tablet Commonly  known as:  AMBIEN Take 10 mg by mouth at bedtime as needed for sleep.       VVS, Skin clean, dry and intact without evidence of skin break down, no evidence of skin tears noted.  IV catheter discontinued intact. Site without signs and symptoms of complications. Dressing and pressure applied.  An After Visit Summary was printed and given to the patient.  Patient escorted via [place], and D/C home via private auto.  Cyndra Numbers  12/26/2016 5:53 PM

## 2016-12-26 NOTE — Progress Notes (Signed)
Dialysis treatment completed.  2500 mL ultrafiltrated.  1000 mL net fluid removal.  Patient status unchanged. Lung sounds clear to ausculation in all fields. No edema. Cardiac: NSR.  Cleansed RIJ catheter with chlorhexidine.  Disconnected lines and flushed ports with saline per protocol.  Ports locked with heparin and capped per protocol.    Report given to bedside, RN Adrienne.

## 2016-12-26 NOTE — Progress Notes (Signed)
[name] Progress Note   Subjective: no c/o's , Hb yest was 7.7, has 2 u prbc's ready to give  Objective Vitals:   12/25/16 1530 12/25/16 1755 12/25/16 1942 12/26/16 0505  BP: (!) 93/43 (!) 98/54 (!) 115/56 (!) 124/59  Pulse: 86  81 80  Resp: (!) 21  18 18   Temp:   97.7 F (36.5 C) 97.7 F (36.5 C)  TempSrc:   Oral Oral  SpO2:   98% 93%  Weight:      Height:       Physical Exam General: Pleasant, WN,WD female in NAD Heart: S1,S2, RRR Lungs: CTAB  Abdomen: soft, nontender Extremities: No LE edema Dialysis Access: LUA AVF ace wrap in place. +bruit  RIJ TDC Drsg CDI blood lines connected.    Additional Objective Labs: Basic Metabolic Panel:  Recent Labs Lab 12/23/16 0427 12/24/16 0334 12/25/16 1345  NA 133* 131* 131*  K 4.3 4.7 3.7  CL 95* 91* 92*  CO2 26 25 27   GLUCOSE 126* 102* 113*  BUN 45* 54* 17  CREATININE 7.37* 9.00* 3.62*  CALCIUM 7.7* 7.7* 8.1*  PHOS 5.1*  --  2.9   Liver Function Tests:  Recent Labs Lab 12/23/16 0427 12/25/16 1345  ALBUMIN 2.3* 2.3*   CBC:  Recent Labs Lab 12/21/16 1735 12/22/16 0310 12/24/16 0334 12/25/16 0830  WBC 8.2 6.7 6.7 8.1  HGB 9.1* 8.1* 7.7* 7.7*  HCT 28.8* 25.1* 23.7* 24.2*  MCV 91.1 90.0 90.5 93.4  PLT 280 251 254 243   Blood Culture    Component Value Date/Time   SDES BLOOD RIGHT HAND 11/19/2015 1610   SPECREQUEST  11/19/2015 0937    BOTTLES DRAWN AEROBIC AND ANAEROBIC 10CCS BLUE 5CCS RED   CULT NO GROWTH 5 DAYS 11/19/2015 9604   REPTSTATUS 11/24/2015 FINAL 11/19/2015 5409    Studies/Results: Dg Chest Port 1 View  Result Date: 12/24/2016 CLINICAL DATA:  Hemodialysis catheter insertion EXAM: PORTABLE CHEST 1 VIEW COMPARISON:  May 14, 2016 FINDINGS: Central catheter tip is in the superior vena cava. No pneumothorax. The lungs are mildly hyperexpanded. There is scarring in the left mid lung and right mid lung regions. There is no edema or consolidation. Heart is enlarged with  pulmonary vascular within normal limits. There is atherosclerotic calcification in the aorta as well as calcification in both carotid arteries. No evident adenopathy. There is postoperative change in the lower cervical spine region. IMPRESSION: Central catheter tip in superior vena cava. No pneumothorax. Stable cardiomegaly. No edema or consolidation. Aortic atherosclerosis as well as calcification noted in each carotid artery. Electronically Signed   By: Lowella Grip III M.D.   On: 12/24/2016 09:43   Medications:  . allopurinol  150 mg Oral QHS  . aspirin EC  325 mg Oral QHS  . cinacalcet  30 mg Oral Q supper  . doxazosin  8 mg Oral QHS  . feeding supplement (NEPRO CARB STEADY)  237 mL Oral BID BM  . ferric citrate  420 mg Oral TID WC  . labetalol  100 mg Oral BID  . losartan  50 mg Oral QHS  . multivitamin  1 tablet Oral QHS  . pantoprazole  40 mg Oral Daily  . simvastatin  10 mg Oral QHS     Dialysis Orders:  [facility] M[facility] 3:45 BFR 350 2K/2Ca EDW 53.5 kg  Heparin 3000 U IV Bolus Calcitriol 2.25 mcg PO 3x q week Mircera 100 mcg IV q 2 weeks ( last 12/16/16)  Assessment: 1. 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 Good bruit, resting AVF and has new TDC for HD.  2. ESRD- MWF HD 3. Hypertension/volume- up 2-3 kg 4. Anemiaof CKD/ ABL- Hgb 7.7 yest, will do short extra HD to give 2 u prbc's and get vol down to dry wt 5. Metabolic bone disease- Cont VDRA/Aurxyia 2 qac 6. Nutrition- renal diet/vitamins  7. Hx CVA  Plan - HD today 2.5 hr with 2u prbc's, for dc home after HD   Kelly Splinter MD [place] pager [phone]   12/26/2016, 9:27 AM

## 2016-12-27 LAB — TYPE AND SCREEN
ABO/RH(D): O POS
ANTIBODY SCREEN: POSITIVE
DAT, IgG: NEGATIVE
PT AG TYPE: NEGATIVE
Unit division: 0
Unit division: 0

## 2016-12-28 DIAGNOSIS — N186 End stage renal disease: Secondary | ICD-10-CM | POA: Diagnosis not present

## 2016-12-28 DIAGNOSIS — N2581 Secondary hyperparathyroidism of renal origin: Secondary | ICD-10-CM | POA: Diagnosis not present

## 2016-12-28 DIAGNOSIS — D631 Anemia in chronic kidney disease: Secondary | ICD-10-CM | POA: Diagnosis not present

## 2016-12-28 NOTE — Discharge Summary (Signed)
[facility] Discharge Summary  [name] February 13, 1951 66 y.o. female  [id number]  Admission Date: 12/21/2016  Discharge Date: 12/26/16  Physician: Servando Snare, MD  Admission Diagnosis: Dialysis Arm Infection ESRD Chronic kidney disease  HPI:   This is a 66 y.o. female with ESRD on HD (MWF), who presents with 4 week history of prolonged bleeding from her left upper arm fistula. She is sent here from her dialysis center. She states that she has issues with bleeding after each dialysis session, including one episode where "I lost a lot of blood." She did not have dialysis today due to concerns of redness over her fistula. She was last seen by Dr. 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 This revealed a tortuous but widely patent fistula. No plans for surgical intervention unless the patient had worsening of her aneurysmal degeneration or if she developed an eschar over her fistula. Her fistula was originally placed in December 2016 by Dr. Trula Slade.   She takes a daily aspirin. She is not on any blood thinners. Her past medical history includes CHF, hypertension and hyperlipidemia all of which are stable.   Hospital Course:  The patient was admitted to the hospital given increased risk of bleeding with her fistula. She was taken to the operating room on 12/22/2016 and underwent revision of left arm fistula with excision of pseudoaneurysm.   POD 1: The patient had a hematoma at the incision site, however there was enough room in her fistula for dialysis. Her fistula was however infiltrated after 3 hours of dialysis. Nephrology wanted to rest the fistula and the patient was scheduled for [facility] placement on  12/24/2016. She underwent evacuation of left arm hematoma and placement of right IJ tunneled dialysis catheter on 12/24/16.  The patient tolerated the procedure well and was transported to the PACU in stable  condition.   The did well post-operatively. She had some mild pain in her arm. She had some drainage from her incision from her hematoma decompressing. Her incision was intact. She had an excellent thrill in her fistula. Her HD catheter was used without complications. She was discharged home on 12/26/16 in good condition.     CBC    Component Value Date/Time   WBC 8.1 12/25/2016 0830   RBC 2.59 (L) 12/25/2016 0830   HGB 7.7 (L) 12/25/2016 0830   HGB 8.7 (L) 09/24/2011 1038   HCT 24.2 (L) 12/25/2016 0830   HCT 27.3 (L) 09/24/2011 1038   PLT 243 12/25/2016 0830   PLT 209 09/24/2011 1038   MCV 93.4 12/25/2016 0830   MCV 94.5 09/24/2011 1038   MCH 29.7 12/25/2016 0830   MCHC 31.8 12/25/2016 0830   RDW 17.9 (H) 12/25/2016 0830   RDW 20.1 (H) 09/24/2011 1038   LYMPHSABS 1.9 06/24/2016 0135   LYMPHSABS 1.1 09/24/2011 1038   MONOABS 0.4 06/24/2016 0135   MONOABS 0.6 09/24/2011 1038   EOSABS 0.2 06/24/2016 0135   EOSABS 0.1 09/24/2011 1038   BASOSABS 0.0 06/24/2016 0135   BASOSABS 0.0 09/24/2011 1038    BMET    Component Value Date/Time   NA 131 (L) 12/25/2016 1345   K 3.7 12/25/2016 1345   CL 92 (L) 12/25/2016 1345   CO2 27 12/25/2016 1345   GLUCOSE 113 (H) 12/25/2016 1345   BUN 17 12/25/2016 1345   CREATININE 3.62 (H) 12/25/2016 1345   CALCIUM 8.1 (L) 12/25/2016 1345   CALCIUM 9.6 08/03/2011  [place] (L) 12/25/2016 1345   GFRAA 14 (L) 12/25/2016 1345     Discharge Instructions:   The patient is discharged to home with extensive instructions on wound care and progressive ambulation.  They are instructed not to drive or perform any heavy lifting until returning to see the physician in his office.  Discharge Instructions    Call MD for:  redness, tenderness, or signs of infection (pain, swelling, bleeding, redness, odor or green/yellow discharge around incision site)    Complete by:  As directed    Call MD for:  severe or increased pain, loss or decreased  feeling  in affected limb(s)    Complete by:  As directed    Call MD for:  temperature >100.5    Complete by:  As directed    Driving Restrictions    Complete by:  As directed    No driving Do not drive for 24 hours and while taking pain medication.   Lifting restrictions    Complete by:  As directed    No lifting for 3 weeks   Resume previous diet    Complete by:  As directed    may wash over wound with mild soap and water    Complete by:  As directed       Discharge Diagnosis:  Dialysis Arm Infection ESRD Chronic kidney disease  Secondary Diagnosis: Patient Active Problem List   Diagnosis Date Noted  . Malnutrition of moderate degree 12/24/2016  . Problem with dialysis access ([place]) 12/21/2016  . Acute ischemic colitis ([place]) 05/16/2016  . Colitis 05/15/2016  . Rectal bleeding 05/15/2016  . Neurologic abnormality 11/19/2015  . Altered mental state 11/19/2015  . Hyperkalemia 11/19/2015  . Anxiety 11/19/2015  . Insomnia 11/19/2015  . ESRD (end stage renal disease) on dialysis ([place])   . Gait instability   . Stroke-like symptom 11/16/2015  . Vestibular neuritis 11/16/2015  . Stroke (cerebrum) ([place]) 11/16/2015  . Dizziness 05/09/2015  . Ataxia 05/09/2015  . H/O: CVA (cerebrovascular accident) 05/09/2015  . Left facial numbness 05/09/2015  . Left leg numbness 05/09/2015  . Hyperlipidemia   . CHF (congestive heart failure) ([place])   . Congestive heart disease ([place])   . Shortness of breath 04/01/2013  . Volume overload 04/01/2013  . HTN (hypertension) 04/01/2013  . Cholecystitis, acute 11/07/2012  . ESRD (end stage renal disease) ([place]) 11/07/2012  . GERD (gastroesophageal reflux disease) 11/07/2012  . Anemia in CKD (chronic kidney disease) 11/07/2012   Past Medical History:  Diagnosis Date  . Adenomatous polyp of colon 10/2010, 2006, 2015  . Anemia in CKD (chronic kidney disease) 11/07/2012   s/p blood transfusion.   . Arthritis   . CHF (congestive heart failure)  ([place])   . Constipation   . Depression with anxiety   . Diverticula, colon   . 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  Had deceased donor kidney transplant in 1996.  Had some early rejection then stable function for years, then had slow decline of function and went back on hemodialysis in 2012.  Gets HD TTS schedule at [facility] on [facility] still using L forearm AVF.     Marland Kitchen GERD (gastroesophageal reflux disease)   . Headache   . Hyperlipidemia   . Hypertension   . Neuromuscular disorder (HCC)    neuropathy hand and legs  . Osteoporosis   . Pneumonia   .  Stroke [facility]) 11/2015   TIA  . Weight loss, unintentional      Allergies as of 12/26/2016      Reactions   Sulfa Antibiotics Other (See Comments)   Both parents allergic-so will not take      Medication List    TAKE these medications   acetaminophen 325 MG tablet Commonly known as:  TYLENOL Take 650 mg by mouth every 6 (six) hours as needed for mild pain.   acetaminophen-codeine 300-30 MG tablet Commonly known as:  TYLENOL #3 Take 1-2 tablets by mouth every 6 (six) hours as needed for moderate pain.   allopurinol 300 MG tablet Commonly known as:  ZYLOPRIM Take 150 mg by mouth at bedtime.   ALPRAZolam 0.25 MG tablet Commonly known as:  XANAX Take 0.25 mg by mouth 2 (two) times daily as needed for anxiety.   aspirin EC 325 MG tablet Take 325 mg by mouth at bedtime.   DIALYVITE PO Take 1 tablet by mouth daily.   doxazosin 8 MG tablet Commonly known as:  CARDURA Take 1 tablet (8 mg total) by mouth at bedtime.   labetalol 200 MG tablet Commonly known as:  NORMODYNE Take 200 mg by mouth 2 (two) times daily.   lactulose 10 GM/15ML solution Commonly known as:  CHRONULAC Take 20 g by mouth daily as needed for mild constipation.   losartan 100 MG tablet Commonly known as:  COZAAR Take 100 mg by mouth at bedtime.   meclizine 25  MG tablet Commonly known as:  ANTIVERT Take 1 tablet (25 mg total) by mouth 2 (two) times daily as needed for dizziness.   oxyCODONE-acetaminophen 5-325 MG tablet Commonly known as:  ROXICET Take 1 tablet by mouth every 6 (six) hours as needed for severe pain.   promethazine 25 MG tablet Commonly known as:  PHENERGAN Take 25 mg by mouth every 6 (six) hours as needed for nausea or vomiting.   SENSIPAR 30 MG tablet Generic drug:  cinacalcet Take 30 mg by mouth at bedtime.   simvastatin 10 MG tablet Commonly known as:  ZOCOR Take 10 mg by mouth at bedtime.   zolpidem 10 MG tablet Commonly known as:  AMBIEN Take 10 mg by mouth at bedtime as needed for sleep.       Percocet #10 No Refill  Disposition: home  Patient's condition: is Good  Follow up: 1. Dr. Donzetta Matters in 2 weeks   Virgina Jock, PA-C [facility] [phone] 12/28/2016  12:08 PM

## 2016-12-29 ENCOUNTER — Other Ambulatory Visit: Payer: Self-pay

## 2016-12-29 ENCOUNTER — Ambulatory Visit (INDEPENDENT_AMBULATORY_CARE_PROVIDER_SITE_OTHER): Payer: Self-pay | Admitting: Family

## 2016-12-29 ENCOUNTER — Encounter: Payer: Self-pay | Admitting: Family

## 2016-12-29 VITALS — BP 125/82 | HR 88 | Temp 97.6°F | Resp 16 | Ht 62.5 in | Wt 121.0 lb

## 2016-12-29 DIAGNOSIS — T82510D Breakdown (mechanical) of surgically created arteriovenous fistula, subsequent encounter: Secondary | ICD-10-CM

## 2016-12-29 DIAGNOSIS — Z992 Dependence on renal dialysis: Secondary | ICD-10-CM

## 2016-12-29 DIAGNOSIS — I770 Arteriovenous fistula, acquired: Secondary | ICD-10-CM

## 2016-12-29 DIAGNOSIS — M7989 Other specified soft tissue disorders: Secondary | ICD-10-CM

## 2016-12-29 DIAGNOSIS — T82898D Other specified complication of vascular prosthetic devices, implants and grafts, subsequent encounter: Secondary | ICD-10-CM

## 2016-12-29 DIAGNOSIS — N186 End stage renal disease: Secondary | ICD-10-CM

## 2016-12-29 NOTE — Progress Notes (Signed)
    Postoperative Access Visit   History of Present Illness  [name] is a 66 y.o. year old female who is s/p US guided cannulation of right internal jugular vein, Placement of 19cm tunneled dialysis catheter, and Evacuation of left arm hematoma on 12-24-16 by Dr. Donzetta Matters Pt had a revision of her left upper extremity AV fistula for ulceration and bleeding on 12-22-16. Postoperatively she developed a hematoma and subsequently required catheter placement and evacuation of the hematoma. Findings: There are multiple collaterals in the right neck. We were able to get access into the internal jugular system using micropuncture sheat in the left arm there was significant hematoma that was evacuated without any evidence of bleeding. We did oversew one area on the fistula that had continuous ooze..               She returns today with report from pt's kidney center of open incision of left upper arm access. Pt noticed an open wound about a week ago at her left upper arm AVF  She denies fever or chills.   She dialyzes M-W-F via right upper chest catheter.   She had a kidney transplant in 1976 to 2011, back on HD in 2012.  She is right hand dominant.   The patient notes no steal symptoms in her left upper extremity.  The patient is able to complete their activities of daily living.      For VQI Use Only  PRE-ADM LIVING: Home  AMB STATUS: Ambulatory  Physical Examination Vitals:   12/29/16 1015  BP: 125/82  Pulse: 88  Resp: 16  Temp: 97.6 F (36.4 C)  TempSrc: Oral  SpO2: (!) 88%  Weight: 121 lb (54.9 kg)  Height: 5' 2.5" (1.588 m)   Body mass index is 21.78 kg/m.   Left upper am incision with sutures in place. A piece of skin seems to have sloughed off adjacent to incision, no eschar present, no erythema, no drainage, moist shallow bed of red tissue at open wound which measures about 2.5 cm x 1.5 cm. Skin feels warm and normal, hand grip is 5/5, sensation in digits is  intact, palpable thrill. Left radial pulse is 1+ palpable.   Medical Decision Making  [name] is a 66 y.o. year old female who presents s/p cannulation of right internal jugular vein, Placement of 19cm tunneled dialysis catheter, and Evacuation of left arm hematoma on 12-24-16 by Dr. Donzetta Matters Pt had a revision of her left upper extremity AV fistula for ulceration and bleeding on 12-22-16. Postoperatively she developed a hematoma and subsequently required catheter placement and evacuation of the hematoma.  Her kidney center requests evaluation of open wound at left upper arm AVF revsion incision, which appears as if a piece of skin has sloughed.  Dr. Donnetta Hutching spoke with and examined pt; he feels this is an area of fat necrosis.  Will schedule for debridement of left upper arm open wound and closure on 12-31-16, a non HD day.    Thank you for allowing [place] to participate in this patient's care.  Pawel Soules, Sharmon Leyden, RN, MSN, FNP-C [facility] [place] Office: [phone]  12/29/2016, 10:30 AM  Clinic MD: Early

## 2016-12-30 DIAGNOSIS — N186 End stage renal disease: Secondary | ICD-10-CM | POA: Diagnosis not present

## 2016-12-30 DIAGNOSIS — N2581 Secondary hyperparathyroidism of renal origin: Secondary | ICD-10-CM | POA: Diagnosis not present

## 2016-12-30 DIAGNOSIS — D631 Anemia in chronic kidney disease: Secondary | ICD-10-CM | POA: Diagnosis not present

## 2016-12-30 NOTE — Progress Notes (Signed)
Spoke with pt for pre-op call, pt walking into dialysis. Quickly went over pre-op instructions with her on the phone and then called her back and left them on her voicemail.

## 2016-12-31 ENCOUNTER — Ambulatory Visit (HOSPITAL_COMMUNITY): Payer: Medicare Other | Admitting: Certified Registered"

## 2016-12-31 ENCOUNTER — Encounter (HOSPITAL_COMMUNITY)
Admission: RE | Disposition: A | Discharge status: Home / Self Care | Payer: Self-pay | Source: Ambulatory Visit | Attending: Vascular Surgery

## 2016-12-31 ENCOUNTER — Encounter (HOSPITAL_COMMUNITY): Payer: Self-pay | Admitting: Certified Registered"

## 2016-12-31 ENCOUNTER — Inpatient Hospital Stay: Admit: 2016-12-31 | Payer: Medicare Other | Admitting: Vascular Surgery

## 2016-12-31 ENCOUNTER — Ambulatory Visit (HOSPITAL_COMMUNITY)
Admission: RE | Admit: 2016-12-31 | Discharge: 2016-12-31 | Disposition: A | Discharge status: Home / Self Care | Payer: Medicare Other | Source: Ambulatory Visit | Attending: Vascular Surgery | Admitting: Vascular Surgery

## 2016-12-31 DIAGNOSIS — Z940 Kidney transplant status: Secondary | ICD-10-CM | POA: Insufficient documentation

## 2016-12-31 DIAGNOSIS — Z809 Family history of malignant neoplasm, unspecified: Secondary | ICD-10-CM | POA: Insufficient documentation

## 2016-12-31 DIAGNOSIS — X58XXXA Exposure to other specified factors, initial encounter: Secondary | ICD-10-CM | POA: Insufficient documentation

## 2016-12-31 DIAGNOSIS — Z8673 Personal history of transient ischemic attack (TIA), and cerebral infarction without residual deficits: Secondary | ICD-10-CM | POA: Insufficient documentation

## 2016-12-31 DIAGNOSIS — Z87891 Personal history of nicotine dependence: Secondary | ICD-10-CM | POA: Diagnosis not present

## 2016-12-31 DIAGNOSIS — E785 Hyperlipidemia, unspecified: Secondary | ICD-10-CM | POA: Insufficient documentation

## 2016-12-31 DIAGNOSIS — I132 Hypertensive heart and chronic kidney disease with heart failure and with stage 5 chronic kidney disease, or end stage renal disease: Secondary | ICD-10-CM | POA: Diagnosis not present

## 2016-12-31 DIAGNOSIS — N186 End stage renal disease: Secondary | ICD-10-CM | POA: Diagnosis not present

## 2016-12-31 DIAGNOSIS — Z79899 Other long term (current) drug therapy: Secondary | ICD-10-CM | POA: Insufficient documentation

## 2016-12-31 DIAGNOSIS — M1990 Unspecified osteoarthritis, unspecified site: Secondary | ICD-10-CM | POA: Diagnosis not present

## 2016-12-31 DIAGNOSIS — I509 Heart failure, unspecified: Secondary | ICD-10-CM | POA: Diagnosis not present

## 2016-12-31 DIAGNOSIS — Z800 Family history of malignant neoplasm of digestive organs: Secondary | ICD-10-CM | POA: Diagnosis not present

## 2016-12-31 DIAGNOSIS — Z992 Dependence on renal dialysis: Secondary | ICD-10-CM | POA: Diagnosis not present

## 2016-12-31 DIAGNOSIS — Z8249 Family history of ischemic heart disease and other diseases of the circulatory system: Secondary | ICD-10-CM | POA: Diagnosis not present

## 2016-12-31 DIAGNOSIS — S41102A Unspecified open wound of left upper arm, initial encounter: Secondary | ICD-10-CM | POA: Diagnosis not present

## 2016-12-31 DIAGNOSIS — T8130XA Disruption of wound, unspecified, initial encounter: Secondary | ICD-10-CM | POA: Diagnosis not present

## 2016-12-31 DIAGNOSIS — Z882 Allergy status to sulfonamides status: Secondary | ICD-10-CM | POA: Insufficient documentation

## 2016-12-31 DIAGNOSIS — K219 Gastro-esophageal reflux disease without esophagitis: Secondary | ICD-10-CM | POA: Diagnosis not present

## 2016-12-31 DIAGNOSIS — Z7982 Long term (current) use of aspirin: Secondary | ICD-10-CM | POA: Diagnosis not present

## 2016-12-31 DIAGNOSIS — F418 Other specified anxiety disorders: Secondary | ICD-10-CM | POA: Diagnosis not present

## 2016-12-31 DIAGNOSIS — T8131XA Disruption of external operation (surgical) wound, not elsewhere classified, initial encounter: Secondary | ICD-10-CM | POA: Diagnosis not present

## 2016-12-31 DIAGNOSIS — M810 Age-related osteoporosis without current pathological fracture: Secondary | ICD-10-CM | POA: Diagnosis not present

## 2016-12-31 DIAGNOSIS — I120 Hypertensive chronic kidney disease with stage 5 chronic kidney disease or end stage renal disease: Secondary | ICD-10-CM | POA: Diagnosis not present

## 2016-12-31 HISTORY — PX: I & D EXTREMITY: SHX5045

## 2016-12-31 LAB — POCT I-STAT 4, (NA,K, GLUC, HGB,HCT)
GLUCOSE: 87 mg/dL (ref 65–99)
HEMATOCRIT: 33 % — AB (ref 36.0–46.0)
Hemoglobin: 11.2 g/dL — ABNORMAL LOW (ref 12.0–15.0)
Potassium: 4.5 mmol/L (ref 3.5–5.1)
Sodium: 137 mmol/L (ref 135–145)

## 2016-12-31 SURGERY — IRRIGATION AND DEBRIDEMENT EXTREMITY
Anesthesia: General | Site: Arm Lower | Laterality: Left

## 2016-12-31 SURGERY — DEBRIDEMENT, WOUND
Anesthesia: Choice | Site: Arm Upper | Laterality: Left

## 2016-12-31 MED ORDER — BACITRACIN ZINC 500 UNIT/GM EX OINT
TOPICAL_OINTMENT | CUTANEOUS | Status: AC
  Filled 2016-12-31: qty 28.35

## 2016-12-31 MED ORDER — PROPOFOL 10 MG/ML IV BOLUS
INTRAVENOUS | Status: DC | PRN
Start: 2016-12-31 — End: 2016-12-31
  Administered 2016-12-31: 50 mg via INTRAVENOUS
  Administered 2016-12-31: 170 mg via INTRAVENOUS

## 2016-12-31 MED ORDER — SODIUM CHLORIDE 0.9 % IV SOLN
INTRAVENOUS | Status: DC
  Administered 2016-12-31: 13:00:00 via INTRAVENOUS

## 2016-12-31 MED ORDER — BACITRACIN ZINC 500 UNIT/GM EX OINT
TOPICAL_OINTMENT | CUTANEOUS | Status: DC | PRN
  Administered 2016-12-31: 1 via TOPICAL

## 2016-12-31 MED ORDER — PROMETHAZINE HCL 25 MG/ML IJ SOLN: 6.2500 mg | INTRAMUSCULAR | Status: DC | PRN

## 2016-12-31 MED ORDER — DEXTROSE 5 % IV SOLN
1.5000 g | INTRAVENOUS | Status: AC
  Administered 2016-12-31: 1.5 g via INTRAVENOUS
  Filled 2016-12-31: qty 1.5

## 2016-12-31 MED ORDER — LIDOCAINE 2% (20 MG/ML) 5 ML SYRINGE
INTRAMUSCULAR | Status: AC
  Filled 2016-12-31: qty 5

## 2016-12-31 MED ORDER — LIDOCAINE 2% (20 MG/ML) 5 ML SYRINGE
INTRAMUSCULAR | Status: DC | PRN
  Administered 2016-12-31: 100 mg via INTRAVENOUS

## 2016-12-31 MED ORDER — FENTANYL CITRATE (PF) 100 MCG/2ML IJ SOLN
INTRAMUSCULAR | Status: DC | PRN
  Administered 2016-12-31: 100 ug via INTRAVENOUS

## 2016-12-31 MED ORDER — OXYCODONE-ACETAMINOPHEN 5-325 MG PO TABS: 1.0000 | ORAL_TABLET | ORAL | 0 refills | Status: DC | PRN

## 2016-12-31 MED ORDER — 0.9 % SODIUM CHLORIDE (POUR BTL) OPTIME
TOPICAL | Status: DC | PRN
  Administered 2016-12-31: 1000 mL

## 2016-12-31 MED ORDER — CHLORHEXIDINE GLUCONATE CLOTH 2 % EX PADS: 6.0000 | MEDICATED_PAD | Freq: Once | CUTANEOUS | Status: DC

## 2016-12-31 MED ORDER — HYDROMORPHONE HCL 1 MG/ML IJ SOLN: 0.2500 mg | INTRAMUSCULAR | Status: DC | PRN

## 2016-12-31 MED ORDER — PHENYLEPHRINE 40 MCG/ML (10ML) SYRINGE FOR IV PUSH (FOR BLOOD PRESSURE SUPPORT)
PREFILLED_SYRINGE | INTRAVENOUS | Status: DC | PRN
  Administered 2016-12-31: 120 ug via INTRAVENOUS

## 2016-12-31 MED ORDER — ONDANSETRON HCL 4 MG/2ML IJ SOLN
INTRAMUSCULAR | Status: DC | PRN
  Administered 2016-12-31: 4 mg via INTRAVENOUS

## 2016-12-31 MED ORDER — FENTANYL CITRATE (PF) 250 MCG/5ML IJ SOLN
INTRAMUSCULAR | Status: AC
  Filled 2016-12-31: qty 5

## 2016-12-31 SURGICAL SUPPLY — 37 items
ADH SKN CLS APL DERMABOND .7 (GAUZE/BANDAGES/DRESSINGS) ×1
BANDAGE ACE 4X5 VEL STRL LF (GAUZE/BANDAGES/DRESSINGS) IMPLANT
BANDAGE ACE 6X5 VEL STRL LF (GAUZE/BANDAGES/DRESSINGS) IMPLANT
BANDAGE ELASTIC 4 VELCRO ST LF (GAUZE/BANDAGES/DRESSINGS) ×1 IMPLANT
BNDG GAUZE ELAST 4 BULKY (GAUZE/BANDAGES/DRESSINGS) IMPLANT
CANISTER SUCTION 2500CC (MISCELLANEOUS) ×2 IMPLANT
COVER SURGICAL LIGHT HANDLE (MISCELLANEOUS) ×2 IMPLANT
DERMABOND ADVANCED (GAUZE/BANDAGES/DRESSINGS) ×1
DERMABOND ADVANCED .7 DNX12 (GAUZE/BANDAGES/DRESSINGS) IMPLANT
DRAPE INCISE IOBAN 66X45 STRL (DRAPES) IMPLANT
DRAPE ORTHO SPLIT 77X108 STRL (DRAPES)
DRAPE PROXIMA HALF (DRAPES) ×2 IMPLANT
DRAPE SURG ORHT 6 SPLT 77X108 (DRAPES) IMPLANT
DRSG ADAPTIC 3X8 NADH LF (GAUZE/BANDAGES/DRESSINGS) ×1 IMPLANT
ELECT REM PT RETURN 9FT ADLT (ELECTROSURGICAL) ×2
ELECTRODE REM PT RTRN 9FT ADLT (ELECTROSURGICAL) ×1 IMPLANT
GAUZE SPONGE 4X4 12PLY STRL (GAUZE/BANDAGES/DRESSINGS) ×2 IMPLANT
GLOVE BIO SURGEON STRL SZ7.5 (GLOVE) ×2 IMPLANT
GLOVE BIOGEL PI IND STRL 8 (GLOVE) ×1 IMPLANT
GLOVE BIOGEL PI INDICATOR 8 (GLOVE) ×1
GOWN STRL REUS W/ TWL LRG LVL3 (GOWN DISPOSABLE) ×3 IMPLANT
GOWN STRL REUS W/TWL LRG LVL3 (GOWN DISPOSABLE) ×6
KIT BASIN OR (CUSTOM PROCEDURE TRAY) ×2 IMPLANT
KIT ROOM TURNOVER OR (KITS) ×2 IMPLANT
NS IRRIG 1000ML POUR BTL (IV SOLUTION) ×2 IMPLANT
PACK CV ACCESS (CUSTOM PROCEDURE TRAY) IMPLANT
PACK GENERAL/GYN (CUSTOM PROCEDURE TRAY) IMPLANT
PAD ARMBOARD 7.5X6 YLW CONV (MISCELLANEOUS) ×4 IMPLANT
SPONGE GAUZE 4X4 12PLY STER LF (GAUZE/BANDAGES/DRESSINGS) ×1 IMPLANT
STOCKINETTE 6  STRL (DRAPES) ×1
STOCKINETTE 6 STRL (DRAPES) IMPLANT
SUT ETHILON 3 0 PS 1 (SUTURE) ×1 IMPLANT
SUT VIC AB 2-0 CTB1 (SUTURE) IMPLANT
SUT VIC AB 3-0 SH 27 (SUTURE)
SUT VIC AB 3-0 SH 27X BRD (SUTURE) IMPLANT
SUT VICRYL 4-0 PS2 18IN ABS (SUTURE) IMPLANT
WATER STERILE IRR 1000ML POUR (IV SOLUTION) ×2 IMPLANT

## 2016-12-31 NOTE — Anesthesia Procedure Notes (Signed)
Procedure Name: LMA Insertion Date/Time: 12/31/2016 1:08 PM Performed by: Myna Bright Pre-anesthesia Checklist: Patient identified, Emergency Drugs available, Suction available and Patient being monitored Patient Re-evaluated:Patient Re-evaluated prior to inductionOxygen Delivery Method: Circle system utilized Preoxygenation: Pre-oxygenation with 100% oxygen Intubation Type: IV induction Ventilation: Mask ventilation without difficulty LMA: LMA inserted LMA Size: 4.0 Number of attempts: 1 Placement Confirmation: positive ETCO2 and breath sounds checked- equal and bilateral Tube secured with: Tape Dental Injury: Teeth and Oropharynx as per pre-operative assessment

## 2016-12-31 NOTE — Anesthesia Preprocedure Evaluation (Signed)
Anesthesia Evaluation  Patient identified by MRN, date of birth, ID band Patient awake    Reviewed: Allergy & Precautions, NPO status   Airway Mallampati: II  TM Distance: >3 FB Neck ROM: Full    Dental  (+) Teeth Intact   Pulmonary shortness of breath, pneumonia, former smoker,    breath sounds clear to auscultation       Cardiovascular hypertension, + Cardiac Stents and +CHF   Rhythm:Regular Rate:Normal     Neuro/Psych  Neuromuscular disease CVA    GI/Hepatic Neg liver ROS, GERD  ,  Endo/Other  negative endocrine ROS  Renal/GU Renal disease     Musculoskeletal  (+) Arthritis ,   Abdominal   Peds  Hematology  (+) anemia ,   Anesthesia Other Findings   Reproductive/Obstetrics                             Anesthesia Physical Anesthesia Plan  ASA: III  Anesthesia Plan: General   Post-op Pain Management:    Induction: Intravenous  Airway Management Planned: LMA  Additional Equipment:   Intra-op Plan:   Post-operative Plan: Extubation in OR  Informed Consent: I have reviewed the patients History and Physical, chart, labs and discussed the procedure including the risks, benefits and alternatives for the proposed anesthesia with the patient or authorized representative who has indicated his/her understanding and acceptance.   Dental advisory given  Plan Discussed with: CRNA  Anesthesia Plan Comments:         Anesthesia Quick Evaluation

## 2016-12-31 NOTE — Anesthesia Postprocedure Evaluation (Addendum)
Anesthesia Post Note  Patient: [name]  Procedure(s) Performed: Procedure(s) (LRB): IRRIGATION AND DEBRIDEMENT EXTREMITY (Left)  Patient location during evaluation: PACU Anesthesia Type: General Level of consciousness: awake and alert Pain management: pain level controlled Vital Signs Assessment: post-procedure vital signs reviewed and stable Respiratory status: spontaneous breathing, nonlabored ventilation, respiratory function stable and patient connected to nasal cannula oxygen Cardiovascular status: blood pressure returned to baseline and stable Postop Assessment: no signs of nausea or vomiting Anesthetic complications: no       Last Vitals:  Vitals:   12/31/16 1450 12/31/16 1510  BP:  139/84  Pulse:  86  Resp:  14  Temp: 36.4 C     Last Pain:  Vitals:   12/31/16 1435  TempSrc:   PainSc: 4                  Blessen Kimbrough,JAMES TERRILL

## 2016-12-31 NOTE — Transfer of Care (Signed)
Immediate Anesthesia Transfer of Care Note  Patient: [name]  Procedure(s) Performed: Procedure(s): IRRIGATION AND DEBRIDEMENT EXTREMITY (Left)  Patient Location: PACU  Anesthesia Type:General  Level of Consciousness: awake, alert , oriented and patient cooperative  Airway & Oxygen Therapy: Patient Spontanous Breathing and Patient connected to nasal cannula oxygen  Post-op Assessment: Report given to RN, Post -op Vital signs reviewed and stable and Patient moving all extremities  Post vital signs: Reviewed and stable  Last Vitals:  Vitals:   12/31/16 1145  BP: (!) 173/85  Pulse: 88  Resp: 18  Temp: 36.7 C    Last Pain:  Vitals:   12/31/16 1145  TempSrc: Oral         Complications: No apparent anesthesia complications

## 2016-12-31 NOTE — Interval H&P Note (Signed)
History and Physical Interval Note:  12/31/2016 12:23 PM  [name]  has presented today for surgery, with the diagnosis of Debridement and closure of open wound left arm  The various methods of treatment have been discussed with the patient and family. After consideration of risks, benefits and other options for treatment, the patient has consented to  Procedure(s): IRRIGATION AND DEBRIDEMENT EXTREMITY (Left) as a surgical intervention .  The patient's history has been reviewed, patient examined, no change in status, stable for surgery.  I have reviewed the patient's chart and labs.  Questions were answered to the patient's satisfaction.     [name]

## 2016-12-31 NOTE — Progress Notes (Signed)
patient's cell phone and wallet was taken to the safe in the emergency room.

## 2016-12-31 NOTE — H&P (View-Only) (Signed)
    Postoperative Access Visit   History of Present Illness  [name] is a 66 y.o. year old female who is s/p US guided cannulation of right internal jugular vein, Placement of 19cm tunneled dialysis catheter, and Evacuation of left arm hematoma on 12-24-16 by Dr. Donzetta Matters Pt had a revision of her left upper extremity AV fistula for ulceration and bleeding on 12-22-16. Postoperatively she developed a hematoma and subsequently required catheter placement and evacuation of the hematoma. Findings: There are multiple collaterals in the right neck. We were able to get access into the internal jugular system using micropuncture sheat in the left arm there was significant hematoma that was evacuated without any evidence of bleeding. We did oversew one area on the fistula that had continuous ooze..               She returns today with report from pt's kidney center of open incision of left upper arm access. Pt noticed an open wound about a week ago at her left upper arm AVF  She denies fever or chills.   She dialyzes M-W-F via right upper chest catheter.   She had a kidney transplant in 1976 to 2011, back on HD in 2012.  She is right hand dominant.   The patient notes no steal symptoms in her left upper extremity.  The patient is able to complete their activities of daily living.      For VQI Use Only  PRE-ADM LIVING: Home  AMB STATUS: Ambulatory  Physical Examination Vitals:   12/29/16 1015  BP: 125/82  Pulse: 88  Resp: 16  Temp: 97.6 F (36.4 C)  TempSrc: Oral  SpO2: (!) 88%  Weight: 121 lb (54.9 kg)  Height: 5' 2.5" (1.588 m)   Body mass index is 21.78 kg/m.   Left upper am incision with sutures in place. A piece of skin seems to have sloughed off adjacent to incision, no eschar present, no erythema, no drainage, moist shallow bed of red tissue at open wound which measures about 2.5 cm x 1.5 cm. Skin feels warm and normal, hand grip is 5/5, sensation in digits is  intact, palpable thrill. Left radial pulse is 1+ palpable.   Medical Decision Making  [name] is a 66 y.o. year old female who presents s/p cannulation of right internal jugular vein, Placement of 19cm tunneled dialysis catheter, and Evacuation of left arm hematoma on 12-24-16 by Dr. Donzetta Matters Pt had a revision of her left upper extremity AV fistula for ulceration and bleeding on 12-22-16. Postoperatively she developed a hematoma and subsequently required catheter placement and evacuation of the hematoma.  Her kidney center requests evaluation of open wound at left upper arm AVF revsion incision, which appears as if a piece of skin has sloughed.  Dr. Donnetta Hutching spoke with and examined pt; he feels this is an area of fat necrosis.  Will schedule for debridement of left upper arm open wound and closure on 12-31-16, a non HD day.    Thank you for allowing [place] to participate in this patient's care.  Edison Wollschlager, Sharmon Leyden, RN, MSN, FNP-C [facility] [place] Office: [phone]  12/29/2016, 10:30 AM  Clinic MD: Early

## 2016-12-31 NOTE — Op Note (Signed)
    NAME: [name]   MRN: [id number] DOB: [birth year]    DATE OF OPERATION: 12/31/2016  PREOP DIAGNOSIS: Dehiscence of left upper arm wound.  POSTOP DIAGNOSIS: Same  PROCEDURE: Closure of left upper arm wound.  SURGEON: Judeth Cornfield. Scot Dock, MD, FACS  ASSIST: None  ANESTHESIA: Gen.   EBL: Minimal  INDICATIONS: [name] is a 66 y.o. female who underwent evacuation of a hematoma of her left upper arm fistula. She was seen in the office and had a dehiscence of a segment of her wound. She was set up for closure of the wound.  FINDINGS: The wound was closed with moderate tension.  TECHNIQUE: The patient was taken to the operating room and received a general anesthetic. The left upper extremity was prepped and draped in usual sterile fashion. I undermined the skin at the area that had dehisced allowing me to placed a 5 interrupted vertical mattress sutures. I was able to close the wound with moderate tension. There were no signs of infection. Sterile dressing was applied. The patient tolerated the procedure well and was transferred to the recovery room in stable condition. All needle and sponge counts were correct.  [name], MD, FACS [facility]  DATE OF DICTATION:   12/31/2016

## 2017-01-01 ENCOUNTER — Encounter (HOSPITAL_COMMUNITY): Payer: Self-pay | Admitting: Vascular Surgery

## 2017-01-01 DIAGNOSIS — D631 Anemia in chronic kidney disease: Secondary | ICD-10-CM | POA: Diagnosis not present

## 2017-01-01 DIAGNOSIS — N186 End stage renal disease: Secondary | ICD-10-CM | POA: Diagnosis not present

## 2017-01-01 DIAGNOSIS — N2581 Secondary hyperparathyroidism of renal origin: Secondary | ICD-10-CM | POA: Diagnosis not present

## 2017-01-04 DIAGNOSIS — D631 Anemia in chronic kidney disease: Secondary | ICD-10-CM | POA: Diagnosis not present

## 2017-01-04 DIAGNOSIS — N2581 Secondary hyperparathyroidism of renal origin: Secondary | ICD-10-CM | POA: Diagnosis not present

## 2017-01-04 DIAGNOSIS — N186 End stage renal disease: Secondary | ICD-10-CM | POA: Diagnosis not present

## 2017-01-06 DIAGNOSIS — D631 Anemia in chronic kidney disease: Secondary | ICD-10-CM | POA: Diagnosis not present

## 2017-01-06 DIAGNOSIS — N186 End stage renal disease: Secondary | ICD-10-CM | POA: Diagnosis not present

## 2017-01-06 DIAGNOSIS — N2581 Secondary hyperparathyroidism of renal origin: Secondary | ICD-10-CM | POA: Diagnosis not present

## 2017-01-08 ENCOUNTER — Encounter: Payer: Medicare Other | Admitting: Family

## 2017-01-08 DIAGNOSIS — N186 End stage renal disease: Secondary | ICD-10-CM | POA: Diagnosis not present

## 2017-01-08 DIAGNOSIS — D631 Anemia in chronic kidney disease: Secondary | ICD-10-CM | POA: Diagnosis not present

## 2017-01-08 DIAGNOSIS — N2581 Secondary hyperparathyroidism of renal origin: Secondary | ICD-10-CM | POA: Diagnosis not present

## 2017-01-11 ENCOUNTER — Encounter: Payer: Self-pay | Admitting: Vascular Surgery

## 2017-01-11 DIAGNOSIS — D631 Anemia in chronic kidney disease: Secondary | ICD-10-CM | POA: Diagnosis not present

## 2017-01-11 DIAGNOSIS — N186 End stage renal disease: Secondary | ICD-10-CM | POA: Diagnosis not present

## 2017-01-11 DIAGNOSIS — N2581 Secondary hyperparathyroidism of renal origin: Secondary | ICD-10-CM | POA: Diagnosis not present

## 2017-01-13 DIAGNOSIS — Z992 Dependence on renal dialysis: Secondary | ICD-10-CM | POA: Diagnosis not present

## 2017-01-13 DIAGNOSIS — T8612 Kidney transplant failure: Secondary | ICD-10-CM | POA: Diagnosis not present

## 2017-01-13 DIAGNOSIS — D631 Anemia in chronic kidney disease: Secondary | ICD-10-CM | POA: Diagnosis not present

## 2017-01-13 DIAGNOSIS — N186 End stage renal disease: Secondary | ICD-10-CM | POA: Diagnosis not present

## 2017-01-13 DIAGNOSIS — N2581 Secondary hyperparathyroidism of renal origin: Secondary | ICD-10-CM | POA: Diagnosis not present

## 2017-01-13 DIAGNOSIS — I120 Hypertensive chronic kidney disease with stage 5 chronic kidney disease or end stage renal disease: Secondary | ICD-10-CM | POA: Diagnosis not present

## 2017-01-15 ENCOUNTER — Encounter: Payer: Self-pay | Admitting: Vascular Surgery

## 2017-01-15 ENCOUNTER — Ambulatory Visit (INDEPENDENT_AMBULATORY_CARE_PROVIDER_SITE_OTHER): Payer: Self-pay | Admitting: Vascular Surgery

## 2017-01-15 VITALS — BP 182/92 | HR 77 | Temp 96.9°F | Resp 14 | Ht 62.5 in | Wt 125.0 lb

## 2017-01-15 DIAGNOSIS — D631 Anemia in chronic kidney disease: Secondary | ICD-10-CM | POA: Diagnosis not present

## 2017-01-15 DIAGNOSIS — N2581 Secondary hyperparathyroidism of renal origin: Secondary | ICD-10-CM | POA: Diagnosis not present

## 2017-01-15 DIAGNOSIS — Z992 Dependence on renal dialysis: Secondary | ICD-10-CM

## 2017-01-15 DIAGNOSIS — N186 End stage renal disease: Secondary | ICD-10-CM

## 2017-01-15 NOTE — Progress Notes (Signed)
Vitals:   01/15/17 0949  BP: (!) 181/89  Pulse: 76  Resp: 14  Temp: (!) 96.9 F (36.1 C)  SpO2: 99%  Weight: 125 lb (56.7 kg)  Height: 5' 2.5" (1.588 m)

## 2017-01-15 NOTE — Progress Notes (Signed)
Subjective:     Patient ID: KOLLEEN OCHSNER, female   DOB: 07/17/1951, 66 y.o.   MRN: [id number]  HPI Ms. Stehlin returns today for follow-up of recent plication of her left upper extremity AV fistula that required hematoma evacuation all doubly had tunneled dialysis catheter placement. Dr. Doren Custard also took her back to close the wound in her left upper extremity on January 18. She now states that she has healed. She was to use her fistula. Her left hand feels fine she has no complaints at this time.   Review of Systems No complaints related to today's visit.    Objective:   Physical Exam [id number] Non labored respirations Left arm with palpable thrill in upper arm Incision well healed with nylon sutures Hand is warm, strength 5/5    Assessment/Plan    66 year old female status post revision of left upper extremity AV fistula and is now had hematoma evacuation and closure of wound. She is on dialysis via her right IJ tunneled dialysis catheter at this time. It is okay to start using her fistula today and avoiding the incisional site. Her sutures were removed today. After cannulations we can remove the tunnel catheter. She demonstrates good understanding and is excited used fistula again.     Matsuko Kretz C. Donzetta Matters, MD [facility] [place] Office: [phone] Pager: [phone]

## 2017-01-18 DIAGNOSIS — N186 End stage renal disease: Secondary | ICD-10-CM | POA: Diagnosis not present

## 2017-01-18 DIAGNOSIS — D631 Anemia in chronic kidney disease: Secondary | ICD-10-CM | POA: Diagnosis not present

## 2017-01-18 DIAGNOSIS — N2581 Secondary hyperparathyroidism of renal origin: Secondary | ICD-10-CM | POA: Diagnosis not present

## 2017-01-18 IMAGING — DX DG HIP (WITH OR WITHOUT PELVIS) 2-3V*L*
3 series · 3 of 3 positions shown · non-contrast
Comparison: CT of the abdomen and pelvis 05/14/2016

CLINICAL DATA: Chronic left anterior hip pain

EXAM:
DG HIP (WITH OR WITHOUT PELVIS) 2-3V LEFT

[pelvis ap]
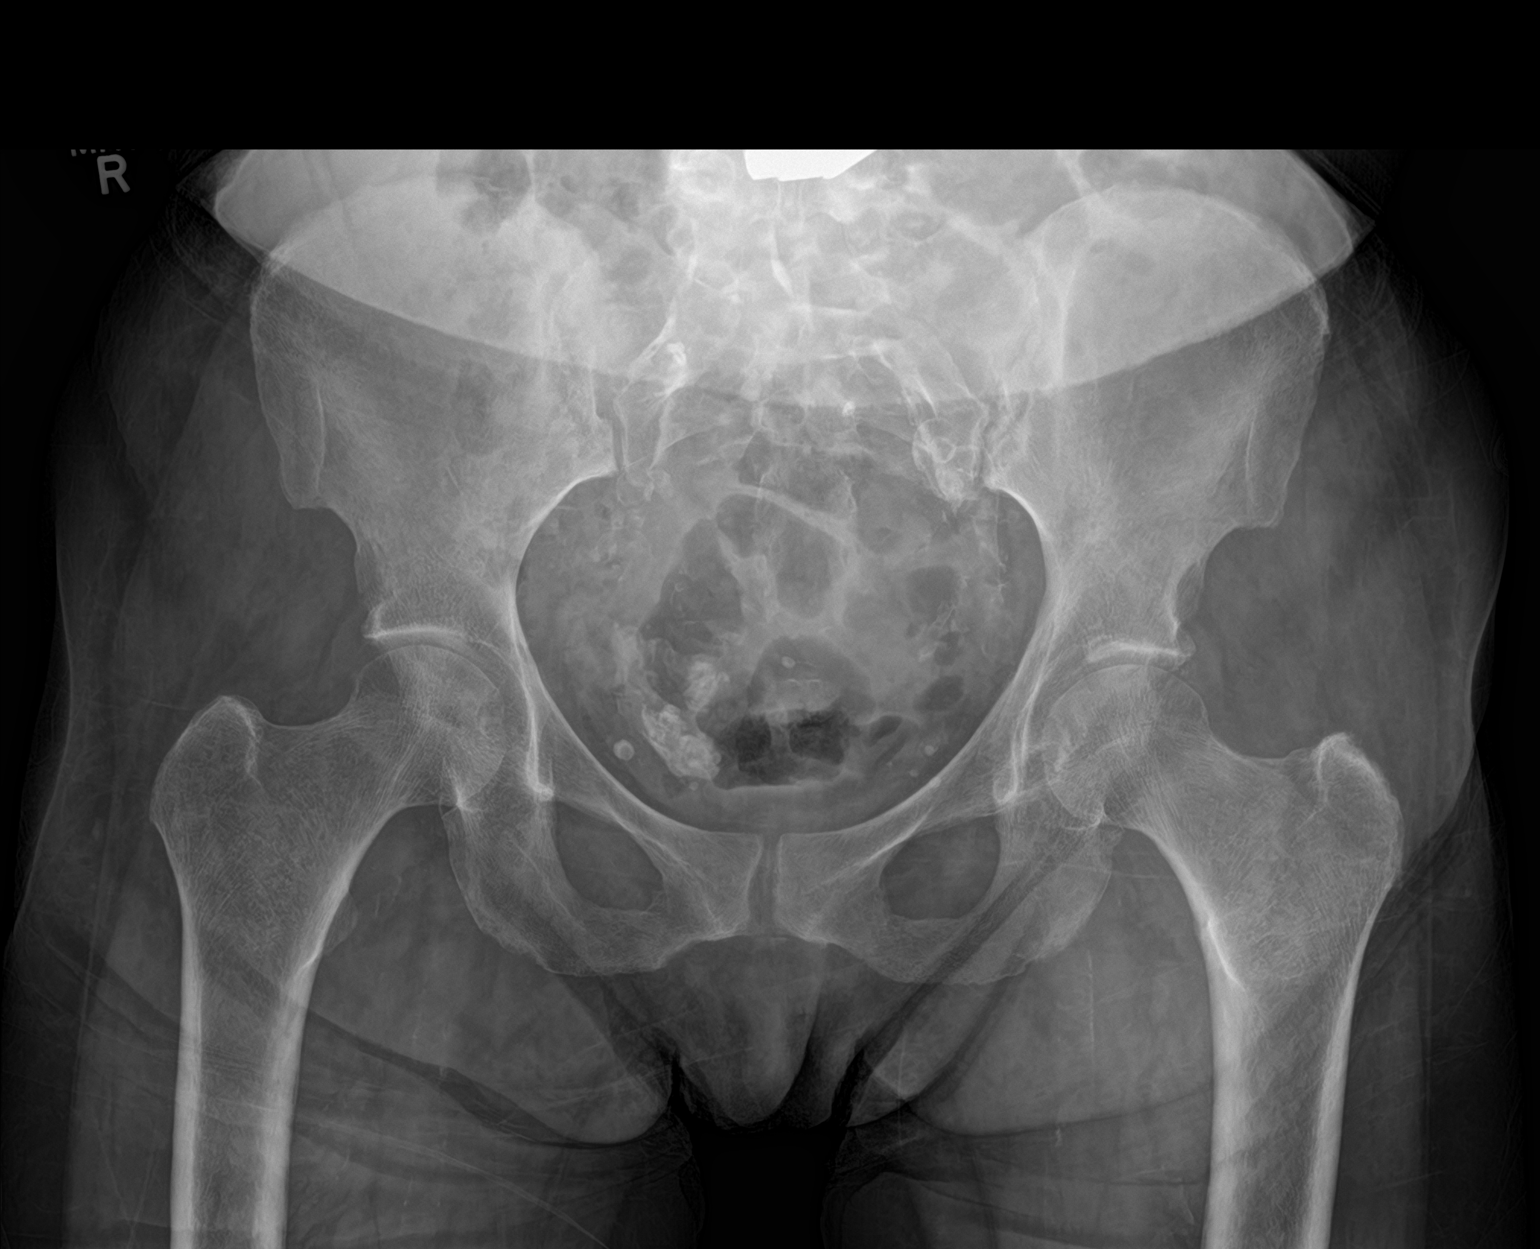

[hip ap]
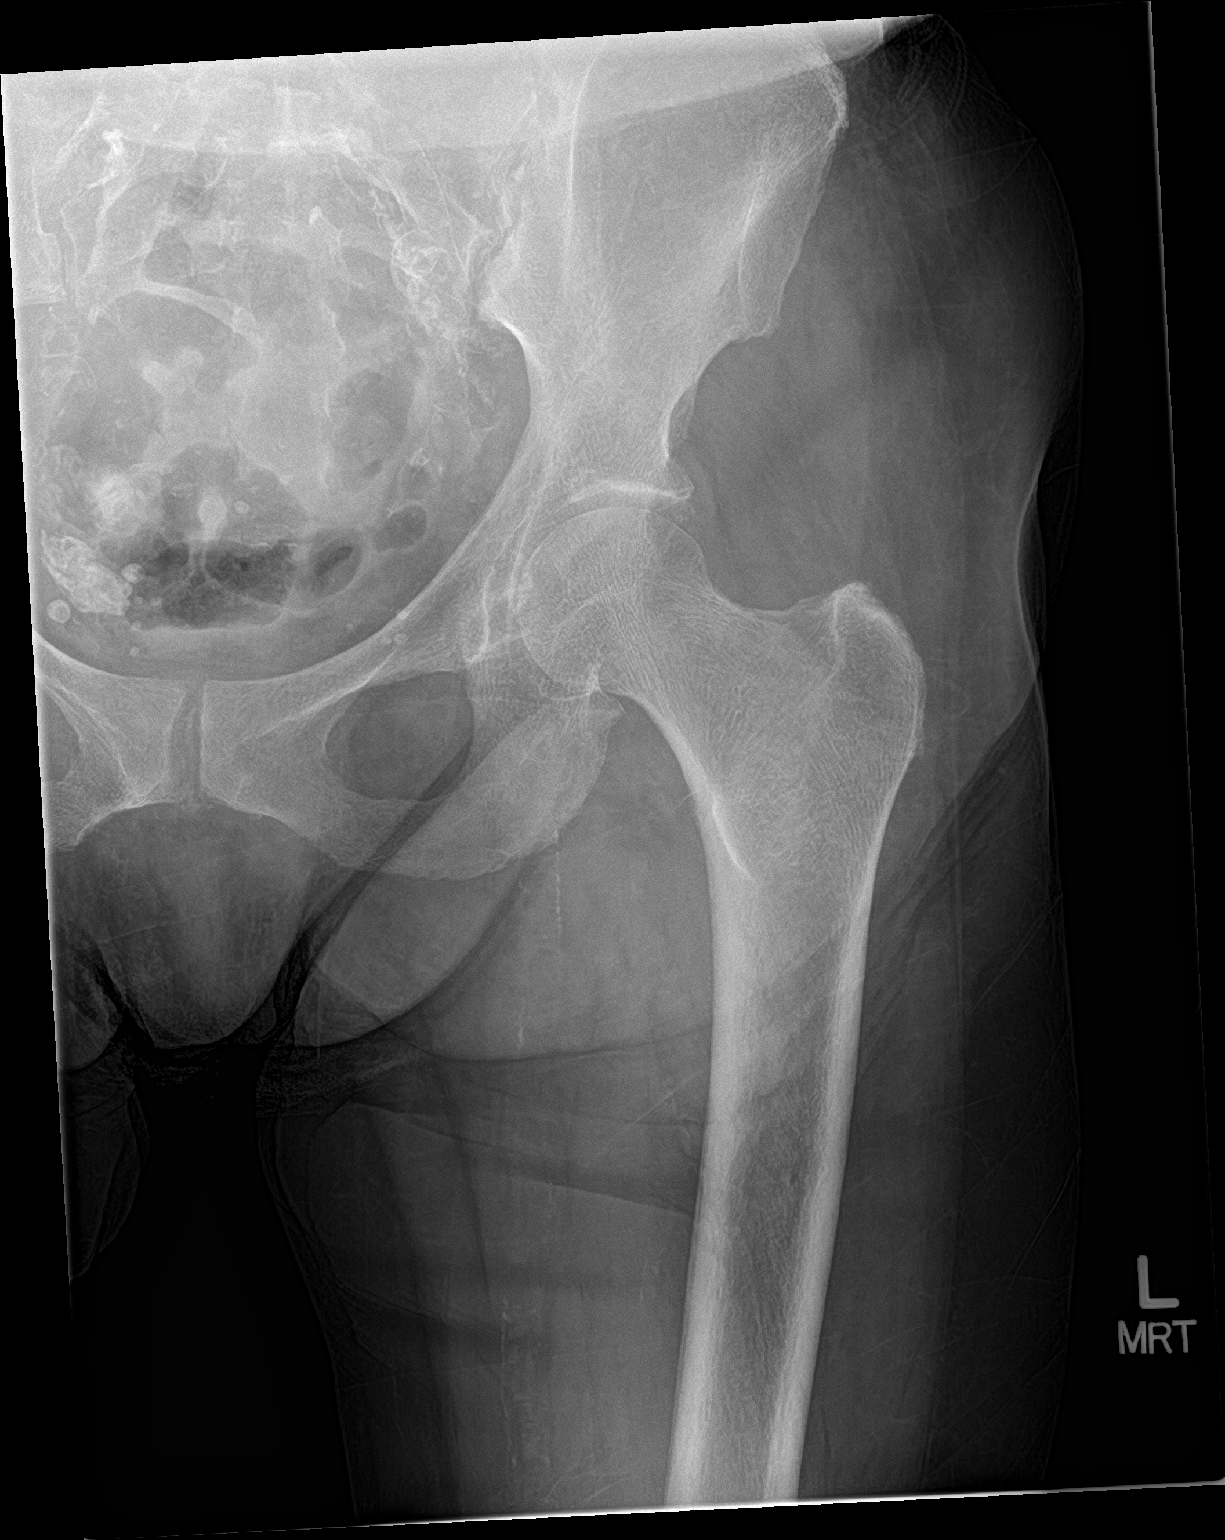

[hip lat]
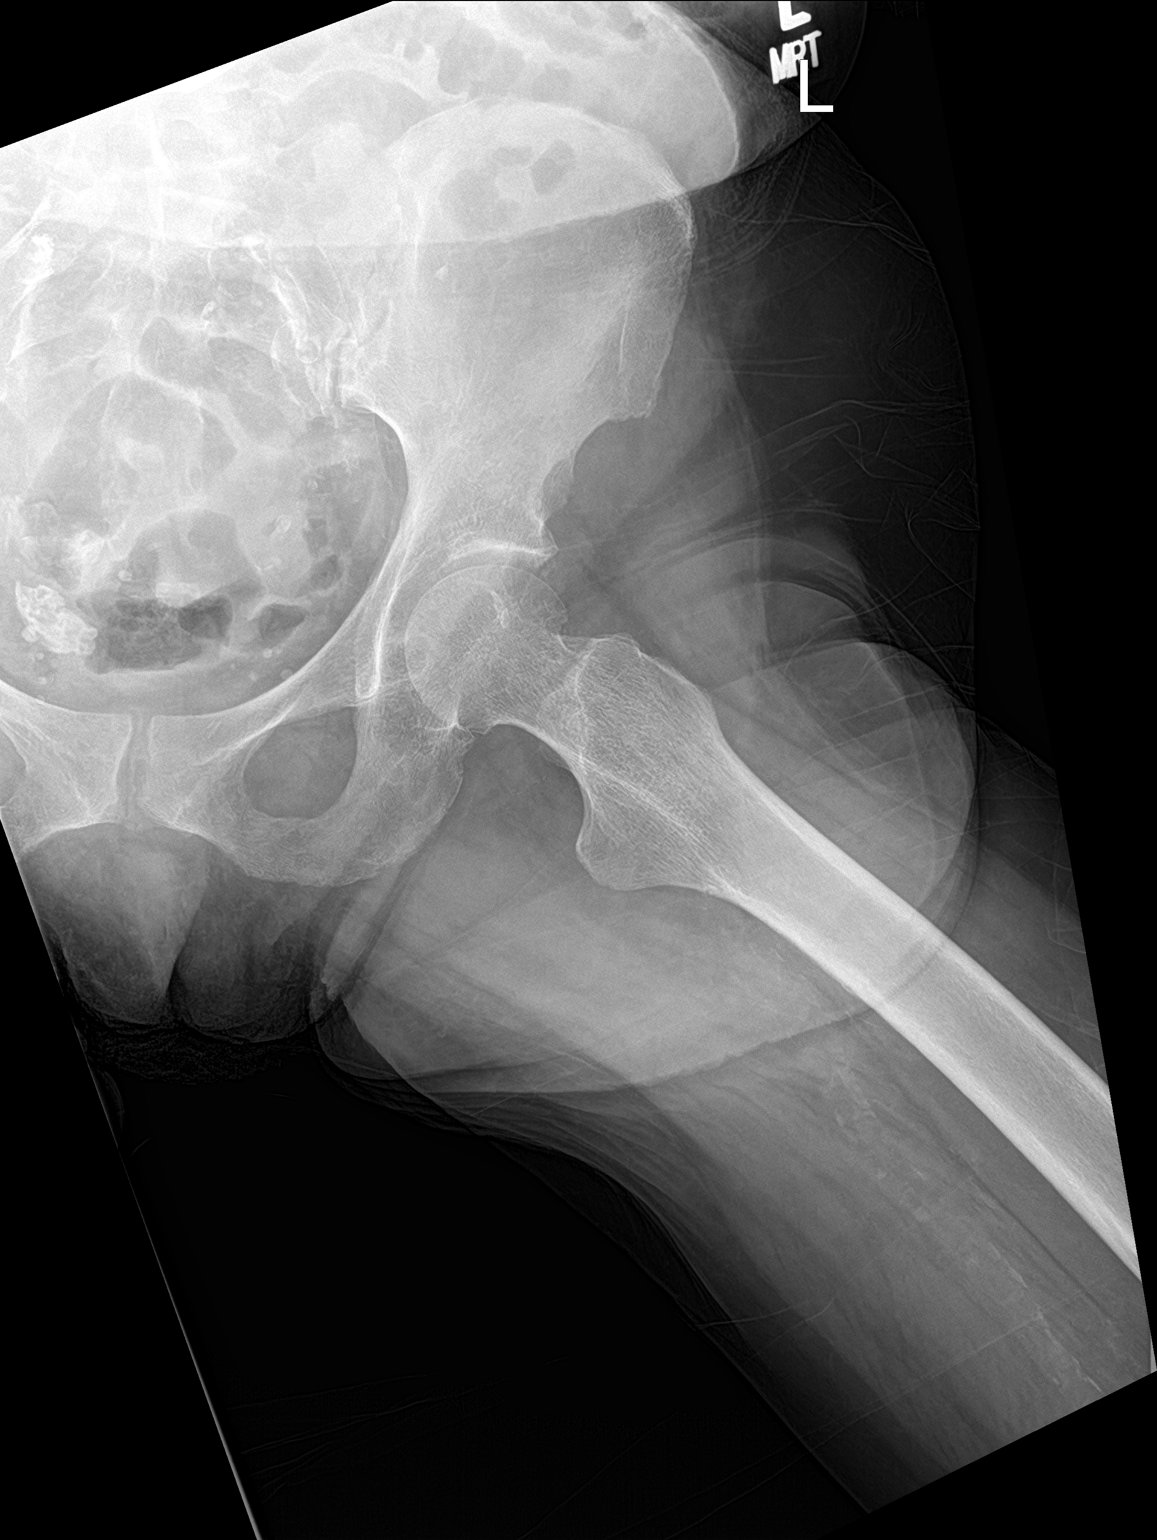

[3 of 3 positions shown; findings below may reference images not displayed]

FINDINGS: Three views of the left hip submitted. No acute fracture or
subluxation. Bilateral hip joints are symmetrical in appearance.
Stable mild cystic degenerative changes left anterior acetabulum.
IMPRESSION: No acute fracture or subluxation.

## 2017-01-18 IMAGING — CT CT HEAD W/O CM
4 series · 16 of 47 positions shown, 18 images · non-contrast
Comparison: 11/16/2015; brain MRI - 11/16/2015

CLINICAL DATA: Loss of balance fallen off of bed on [REDACTED] now with
hematoma to the forehead. Headache. No loss of consciousness.
History of hypertension and stroke.

EXAM:
CT HEAD WITHOUT CONTRAST
TECHNIQUE: Contiguous axial images were obtained from the base of the skull
through the vertex without intravenous contrast.

[Series 2: head without · axial · non-contrast · 0.43mm/px · z∈[-162,-42]mm · 7 of 33 slices shown, 9 images]
[im 5/33  brain]
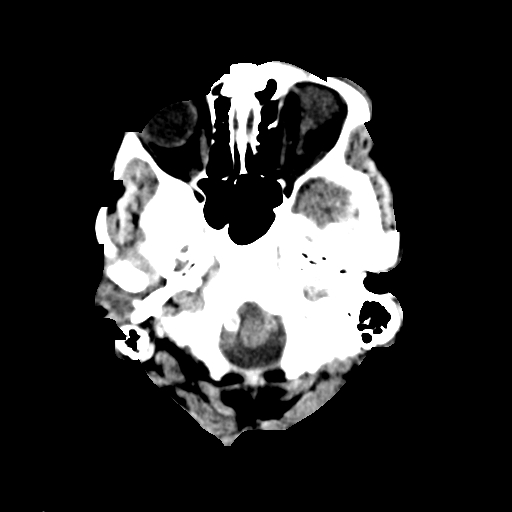
[im 5/33  bone]
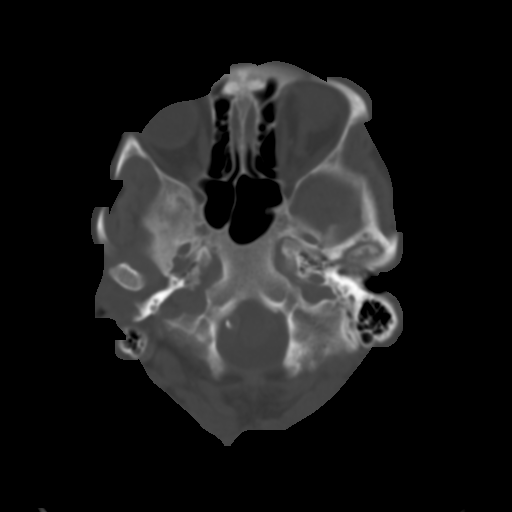
[im 9/33  brain]
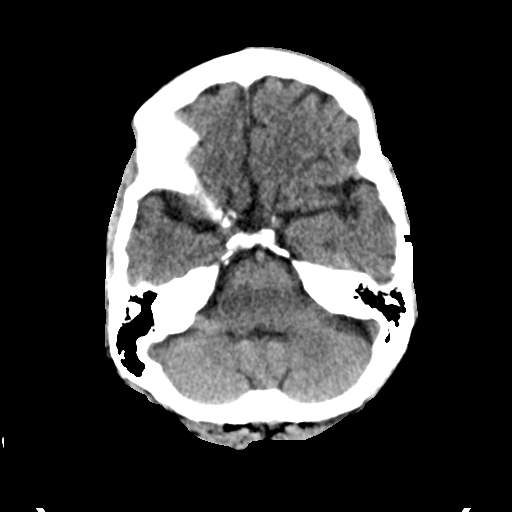
[im 13/33  brain]
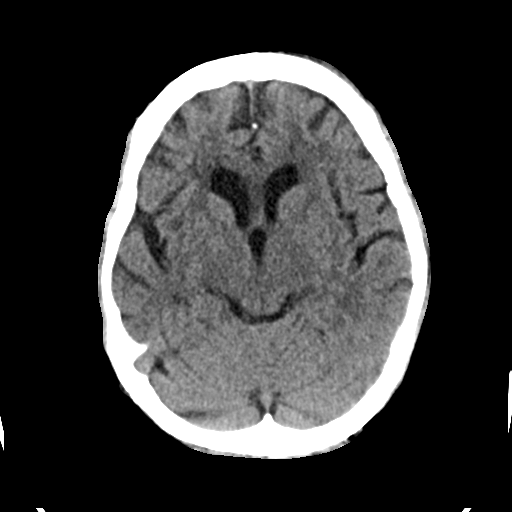
[im 17/33  brain]
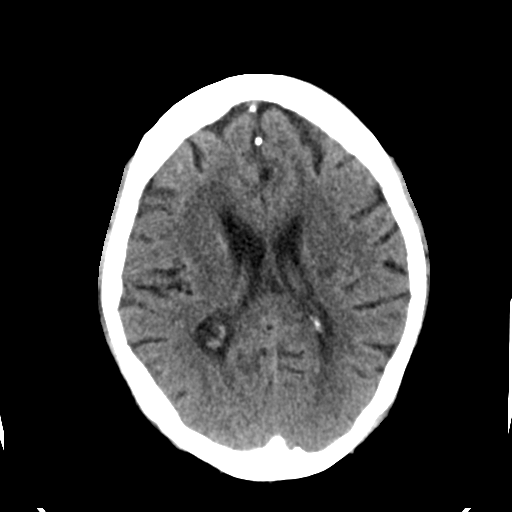
[im 21/33  brain]
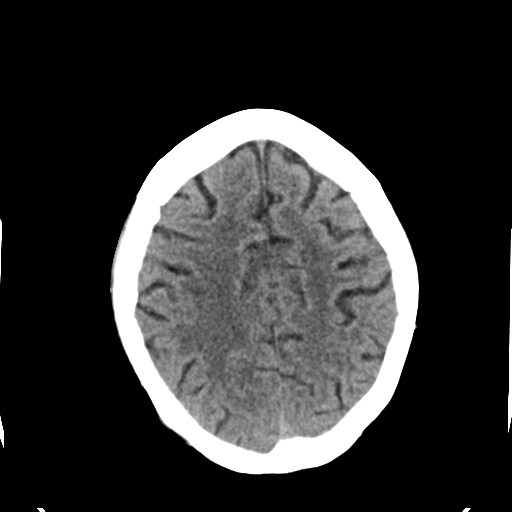
[im 21/33  bone]
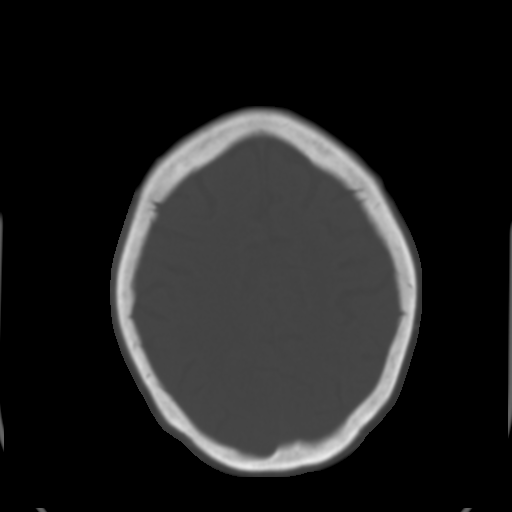
[im 25/33  brain]
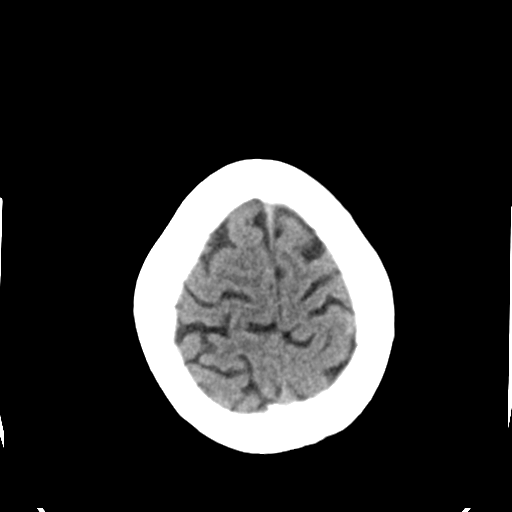
[im 29/33  brain]
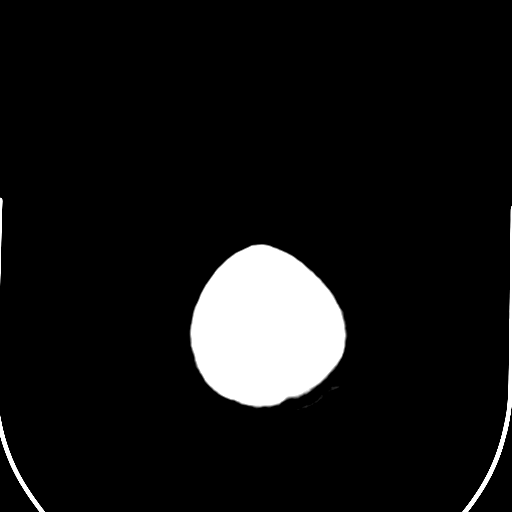

[Series 3: head bone · axial · 0.43mm/px · z∈[-166,-134]mm · 3 of 82 slices shown]
[im 9/82  bone]
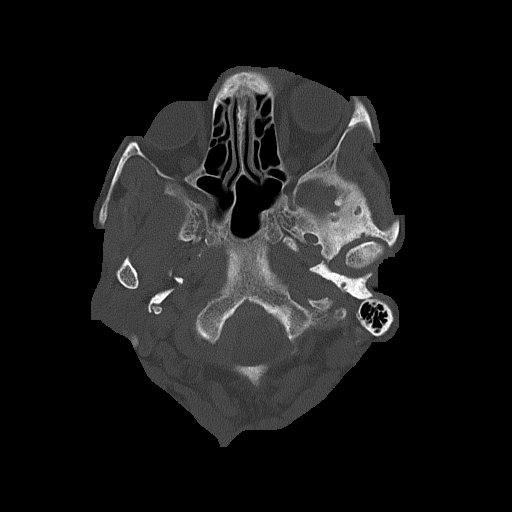
[im 17/82  bone]
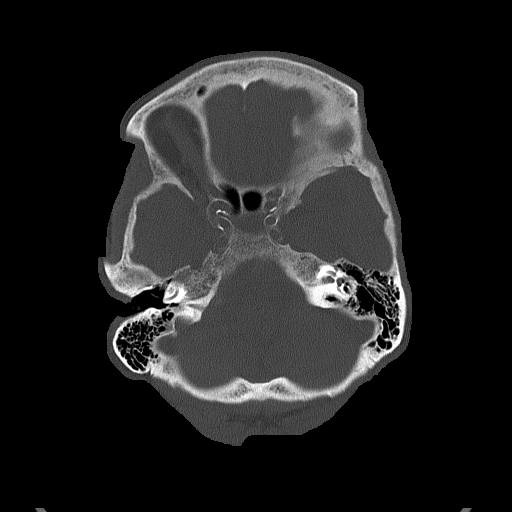
[im 25/82  bone]
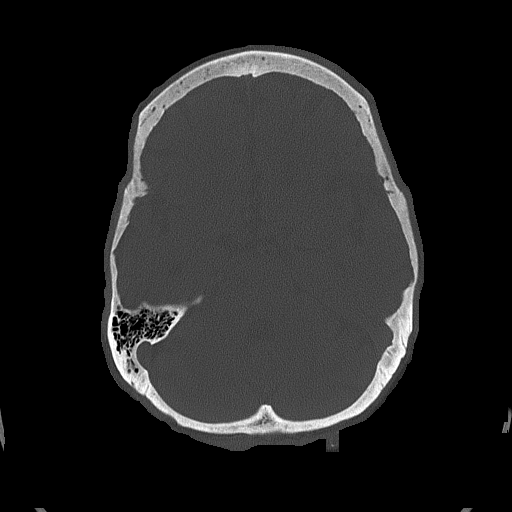

[Series 4: head without cor · coronal · non-contrast · 0.32mm/px · 3 of 65 slices shown]
[im 22/65  brain]
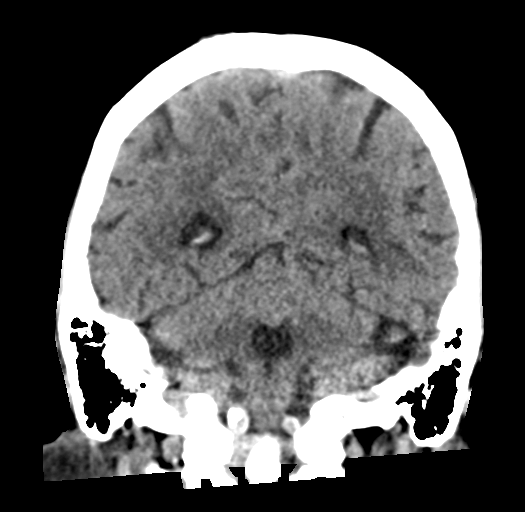
[im 29/65  brain]
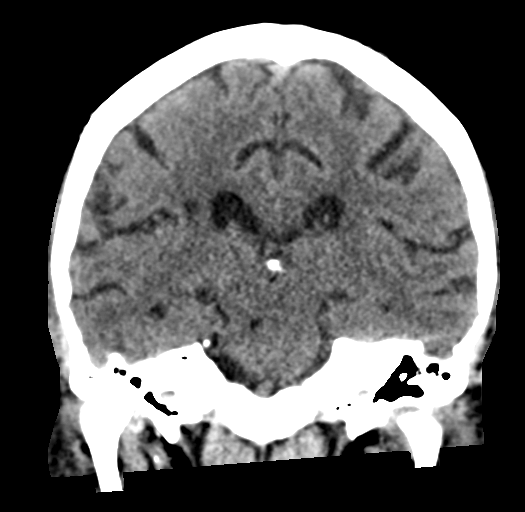
[im 36/65  brain]
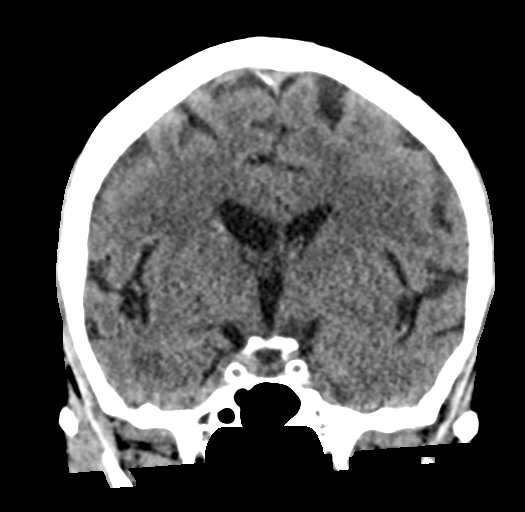

[Series 5: head without sag · sagittal · non-contrast · 0.33mm/px · 3 of 52 slices shown]
[im 18/52  brain]
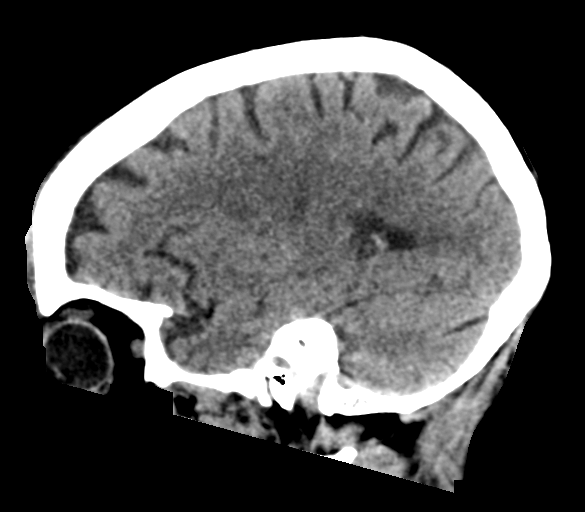
[im 26/52  brain]
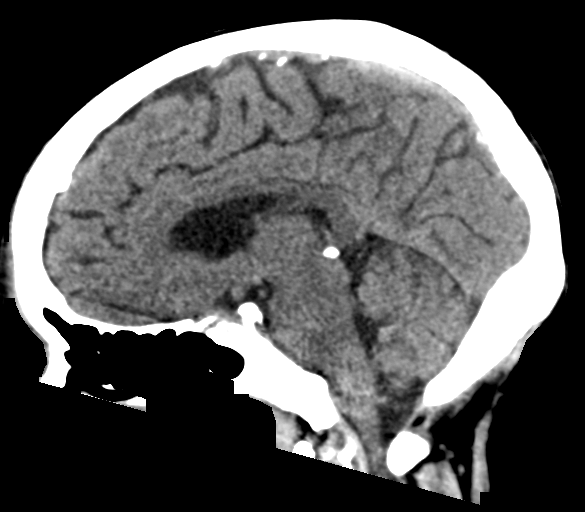
[im 35/52  brain]
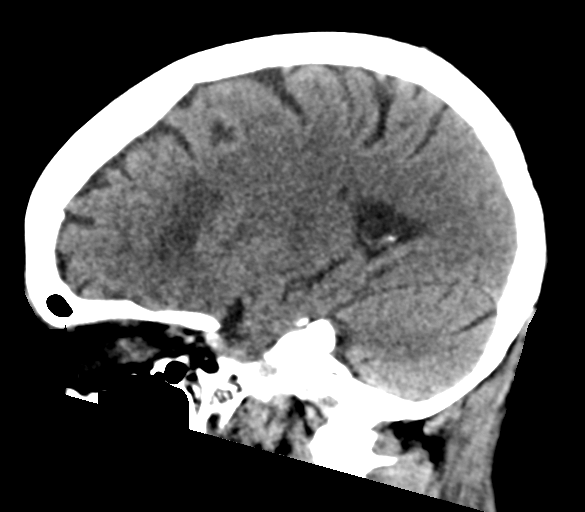

[16 of 47 positions shown; findings below may reference images not displayed]

FINDINGS: Brain: Similar findings of atrophy with sulcal prominence. Scattered
periventricular hypodensities compatible microvascular ischemic
disease. Unchanged lacunar infarcts within the right basal ganglia
(image 14, series 2), the right centrum semiovale (image 19, series
2) as well as the right-side of the pons (image 11). Given
background parenchymal abnormalities, there is no CT evidence of
superimposed acute large territory infarct. No intraparenchymal or
extra-axial mass or hemorrhage. Unchanged size a configuration of
the ventricles and basilar cisterns. No midline shift.

Vascular: Intracranial atherosclerosis.

Skull: No fracture.

Sinuses/Orbits: There is under pneumatization of left frontal sinus.
The remaining paranasal sinuses and mastoid air cells are normally
aerated. No air-fluid levels. Post bilateral cataract surgery.

Other: There is asymmetric soft tissue swelling about the left side
of the forehead measuring approximately 4.9 x 0.8 cm. No associated
radiopaque foreign body or displaced calvarial fracture.
IMPRESSION: 1. Soft tissue swelling about the left side of the forehead without
associated radiopaque foreign body, displaced calvarial fracture or
acute intracranial process.
2. Similar findings of atrophy and microvascular ischemic disease.

## 2017-01-20 DIAGNOSIS — E7800 Pure hypercholesterolemia, unspecified: Secondary | ICD-10-CM | POA: Diagnosis not present

## 2017-01-20 DIAGNOSIS — R413 Other amnesia: Secondary | ICD-10-CM | POA: Diagnosis not present

## 2017-01-20 DIAGNOSIS — I10 Essential (primary) hypertension: Secondary | ICD-10-CM | POA: Diagnosis not present

## 2017-01-20 DIAGNOSIS — N2581 Secondary hyperparathyroidism of renal origin: Secondary | ICD-10-CM | POA: Diagnosis not present

## 2017-01-20 DIAGNOSIS — N186 End stage renal disease: Secondary | ICD-10-CM | POA: Diagnosis not present

## 2017-01-20 DIAGNOSIS — I770 Arteriovenous fistula, acquired: Secondary | ICD-10-CM | POA: Diagnosis not present

## 2017-01-20 DIAGNOSIS — D631 Anemia in chronic kidney disease: Secondary | ICD-10-CM | POA: Diagnosis not present

## 2017-01-21 ENCOUNTER — Other Ambulatory Visit: Payer: Self-pay

## 2017-01-22 DIAGNOSIS — D631 Anemia in chronic kidney disease: Secondary | ICD-10-CM | POA: Diagnosis not present

## 2017-01-22 DIAGNOSIS — N186 End stage renal disease: Secondary | ICD-10-CM | POA: Diagnosis not present

## 2017-01-22 DIAGNOSIS — N2581 Secondary hyperparathyroidism of renal origin: Secondary | ICD-10-CM | POA: Diagnosis not present

## 2017-01-25 DIAGNOSIS — D631 Anemia in chronic kidney disease: Secondary | ICD-10-CM | POA: Diagnosis not present

## 2017-01-25 DIAGNOSIS — N2581 Secondary hyperparathyroidism of renal origin: Secondary | ICD-10-CM | POA: Diagnosis not present

## 2017-01-25 DIAGNOSIS — N186 End stage renal disease: Secondary | ICD-10-CM | POA: Diagnosis not present

## 2017-01-26 ENCOUNTER — Ambulatory Visit (HOSPITAL_COMMUNITY)
Admission: RE | Admit: 2017-01-26 | Discharge: 2017-01-26 | Disposition: A | Discharge status: Home / Self Care | Payer: Medicare Other | Source: Ambulatory Visit | Attending: Vascular Surgery | Admitting: Vascular Surgery

## 2017-01-26 DIAGNOSIS — N186 End stage renal disease: Secondary | ICD-10-CM | POA: Diagnosis not present

## 2017-01-26 MED ORDER — LIDOCAINE HCL (PF) 2 % IJ SOLN
INTRAMUSCULAR | Status: AC
  Filled 2017-01-26: qty 10

## 2017-01-26 NOTE — Progress Notes (Signed)
  [facility] Catheter Removal Procedure Note  Diagnosis: ESRD  Plan:  Remove right IJ tunneled dialysis catheter  Consent signed:  Yes.   Time out completed:  Yes.   Coumadin:  No.  Procedure: 1.  Sterile prepping and draping over catheter area 2. 3 ml 2% lidocaine plain instilled at removal site. 3.  right catheter removed in its entirety with cuff in tact. 4.  Complications:  none 5. Tip of catheter sent for culture:  No.   Patient tolerated procedure well:  No. Pressure held, no bleeding noted, dressing applied Instructions given to the pt regarding wound care and bleeding.   Virgina Jock, [place] [phone] 01/26/2017 12:26 PM

## 2017-01-26 NOTE — Progress Notes (Addendum)
[facility] SHORT STAY H&P  CC:  Catheter removal   HPI:  This is a 66 y.o. female here for right tunneled dialysis catheter removal. She has a functioning left upper arm fistula. She reports report one episode of mild bleeding from her fistula one night when she was sleeping last 4 days ago.    Past Medical History:  Diagnosis Date  . Adenomatous polyp of colon 10/2010, 2006, 2015  . Anemia in CKD (chronic kidney disease) 11/07/2012   s/p blood transfusion.   . Arthritis   . CHF (congestive heart failure) ([place])   . Constipation   . Depression with anxiety   . Diverticula, colon   . 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  Had deceased donor kidney transplant in 1996.  Had some early rejection then stable function for years, then had slow decline of function and went back on hemodialysis in 2012.  Gets HD TTS schedule at [facility] still using L forearm AVF.     Marland Kitchen GERD (gastroesophageal reflux disease)   . Headache   . Hyperlipidemia   . Hypertension   . Neuromuscular disorder (HCC)    neuropathy hand and legs  . Osteoporosis   . Pneumonia   . Stroke [facility]) 11/2015   TIA  . Weight loss, unintentional     FH:  Non-Contributory  Social History   Social History  . Marital status: Widowed    Spouse name: N/A  . Number of children: 2  . Years of education: N/A   Occupational History  . Not on file.   Social History Main Topics  . Smoking status: Former Smoker    Types: Cigarettes    Quit date: 12/31/1991  . Smokeless tobacco: Never Used  . Alcohol use No  . Drug use: No  . Sexual activity: No   Other Topics Concern  . Not on file   Social History Narrative  . No narrative on file    Allergies  Allergen Reactions  . Sulfa Antibiotics Other (See Comments)    Both parents allergic-so will not take  . Adhesive [Tape] Itching    Current  Outpatient Prescriptions  Medication Sig Dispense Refill  . acetaminophen (TYLENOL) 325 MG tablet Take 650 mg by mouth every 6 (six) hours as needed for mild pain.    Marland Kitchen acetaminophen-codeine (TYLENOL #3) 300-30 MG tablet Take 1-2 tablets by mouth every 6 (six) hours as needed for moderate pain. 15 tablet 0  . allopurinol (ZYLOPRIM) 300 MG tablet Take 150 mg by mouth at bedtime.    . ALPRAZolam (XANAX) 0.25 MG tablet Take 0.25 mg by mouth 2 (two) times daily as needed for anxiety.     Marland Kitchen aspirin EC 325 MG tablet Take 325 mg by mouth at bedtime.    Lorin Picket 1 GM 210 MG(Fe) tablet Take 420 mg by mouth 3 (three) times daily. With meals    . B Complex-C-Folic Acid (DIALYVITE PO) Take 1 tablet by mouth daily.    Marland Kitchen doxazosin (CARDURA) 8 MG tablet Take 1 tablet (8 mg total) by mouth at bedtime.    Marland Kitchen labetalol (NORMODYNE) 200 MG tablet Take 200 mg by mouth 2 (two) times daily.     Marland Kitchen lactulose (CHRONULAC) 10 GM/15ML solution Take 20 g by mouth daily as needed for mild constipation.    Marland Kitchen losartan (COZAAR) 100 MG tablet Take 100  mg by mouth at bedtime.     . meclizine (ANTIVERT) 25 MG tablet Take 1 tablet (25 mg total) by mouth 2 (two) times daily as needed for dizziness. 30 tablet 0  . oxyCODONE-acetaminophen (ROXICET) 5-325 MG tablet Take 1 tablet by mouth every 6 (six) hours as needed for severe pain. (Patient not taking: Reported on 01/15/2017) 10 tablet 0  . oxyCODONE-acetaminophen (ROXICET) 5-325 MG tablet Take 1-2 tablets by mouth every 4 (four) hours as needed. (Patient not taking: Reported on 01/15/2017) 20 tablet 0  . promethazine (PHENERGAN) 25 MG tablet Take 25 mg by mouth every 6 (six) hours as needed for nausea or vomiting.    . SENSIPAR 30 MG tablet Take 30 mg by mouth at bedtime.    . simvastatin (ZOCOR) 10 MG tablet Take 10 mg by mouth at bedtime.     Marland Kitchen zolpidem (AMBIEN) 10 MG tablet Take 10 mg by mouth at bedtime as needed for sleep.      Current Facility-Administered Medications  Medication  Dose Route Frequency Provider Last Rate Last Dose  . lidocaine (XYLOCAINE) 2 % injection             ROS:  See HPI  PHYSICAL EXAM  Vitals:   01/26/17 1145  BP: (!) 175/88  Pulse: 80  Resp: 20  Temp: 97.6 F (36.4 C)    Gen:  Well developed well nourished HEENT:  normocephalic Neck:  Right IJ TDC  Lungs:  Non-labored Extremities:  Left upper arm fistula with palpable thrill. Left upper fistula with small 0.5 cm scab that is mobile.  Skin:  No obvious rashes Neuro:  No deficits.   Impression: This is a 66 y.o. female here for right IJ tunneled dialysis catheter removal  Plan:  Removal of right IJ tunneled dialysis catheter. The patient had one episode of minimal bleeding from her fistula while sleeping 4 nights ago. She denies any further bleeding episodes. The scab overlying her fistula is mobile. Advised the patient to not have her fistula cannulated anywhere near her scab to let it heal. She knows to contact the office if she has any issues.   Virgina Jock, PA-C [facility] [phone] 01/26/2017 12:23 PM

## 2017-01-27 DIAGNOSIS — D631 Anemia in chronic kidney disease: Secondary | ICD-10-CM | POA: Diagnosis not present

## 2017-01-27 DIAGNOSIS — N186 End stage renal disease: Secondary | ICD-10-CM | POA: Diagnosis not present

## 2017-01-27 DIAGNOSIS — N2581 Secondary hyperparathyroidism of renal origin: Secondary | ICD-10-CM | POA: Diagnosis not present

## 2017-01-29 DIAGNOSIS — D631 Anemia in chronic kidney disease: Secondary | ICD-10-CM | POA: Diagnosis not present

## 2017-01-29 DIAGNOSIS — N186 End stage renal disease: Secondary | ICD-10-CM | POA: Diagnosis not present

## 2017-01-29 DIAGNOSIS — N2581 Secondary hyperparathyroidism of renal origin: Secondary | ICD-10-CM | POA: Diagnosis not present

## 2017-01-31 IMAGING — MR MR HEAD W/O CM
9 of 10 series · 35 of 48 positions shown · non-contrast
Comparison: CT HEAD June 11, 2016 and MRI of the brain November 16, 2015

CLINICAL DATA: Dizziness for 4 hours prior to arrival. Nausea and
bloating. History of end-stage renal disease on dialysis,
hypertension, hyperlipidemia, vestibular neuritis.

EXAM:
MRI HEAD WITHOUT CONTRAST
TECHNIQUE: Multiplanar, multiecho pulse sequences of the brain and surrounding
structures were obtained without intravenous contrast.

[Series 3: T1 · sagittal · 5.0mm · 0.47mm/px · 3 of 23 slices shown]
[im 1/23]
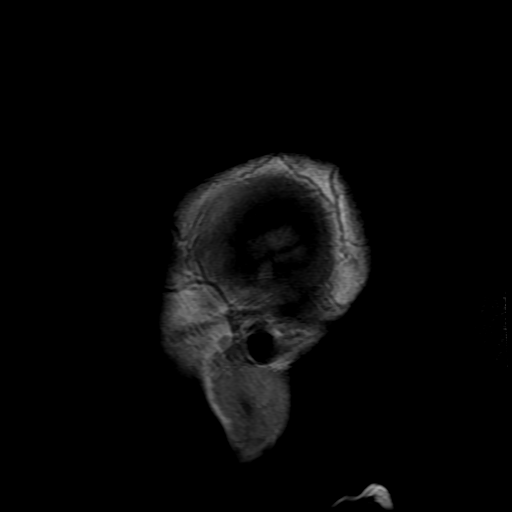
[im 12/23]
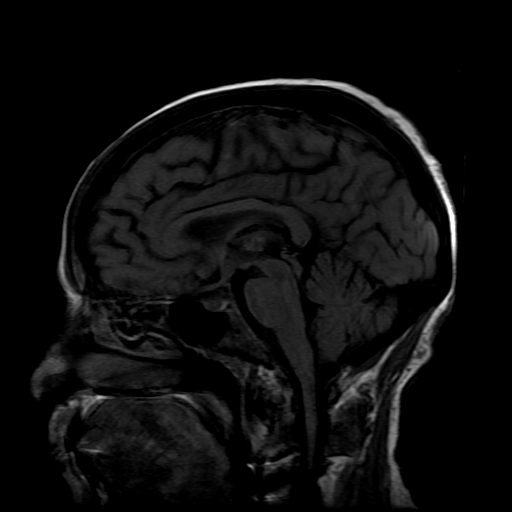
[im 23/23]
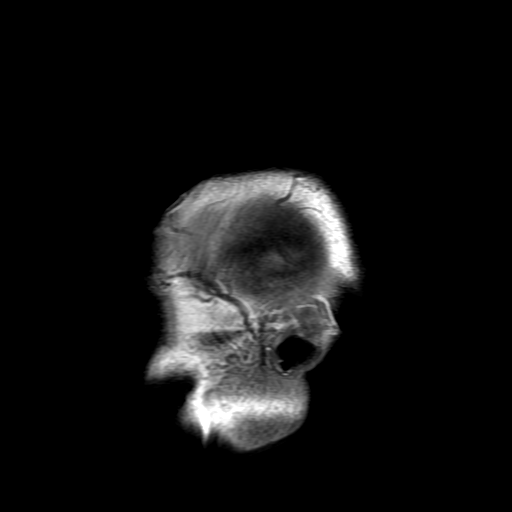

[Series 4: DWI · axial · 3.0mm · 1.09mm/px · z∈[-112,+20]mm · 8 of 90 slices shown (1 of 4)]
[im 1/90]
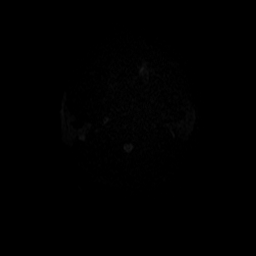
[im 10/90]
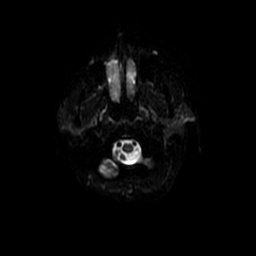
[im 30/90]
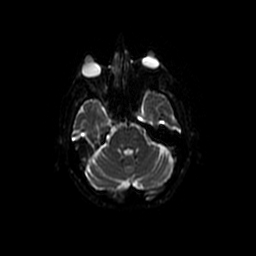
[im 40/90]
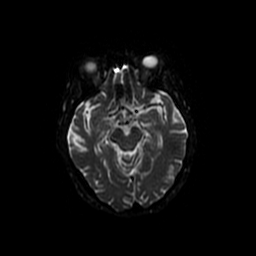
[im 50/90]
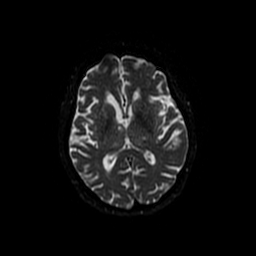
[im 60/90]
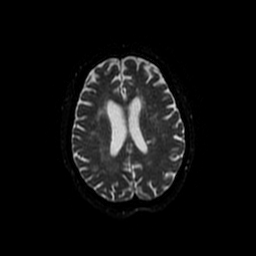
[im 80/90]
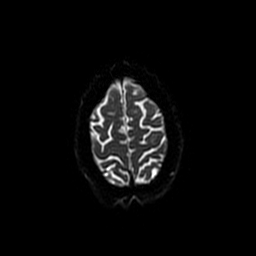
[im 90/90]
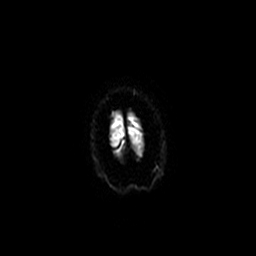

[Series 5: T2 · axial · 5.0mm · 0.43mm/px · z∈[-110,+21]mm · 2 of 23 slices shown (1 of 2)]
[im 1/23]
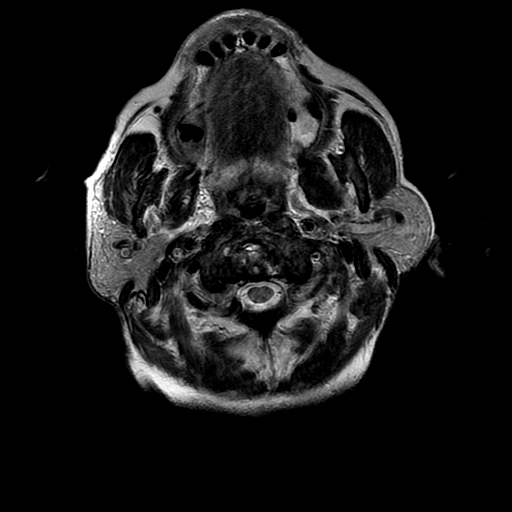
[im 23/23]
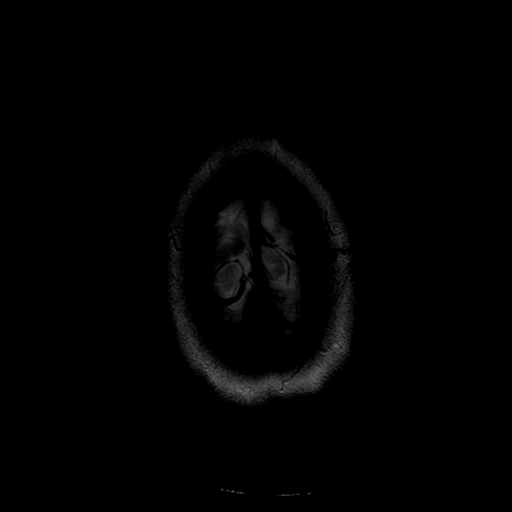

[Series 6: FLAIR · axial · 5.0mm · 0.43mm/px · z∈[-110,+21]mm · 2 of 23 slices shown]
[im 1/23]
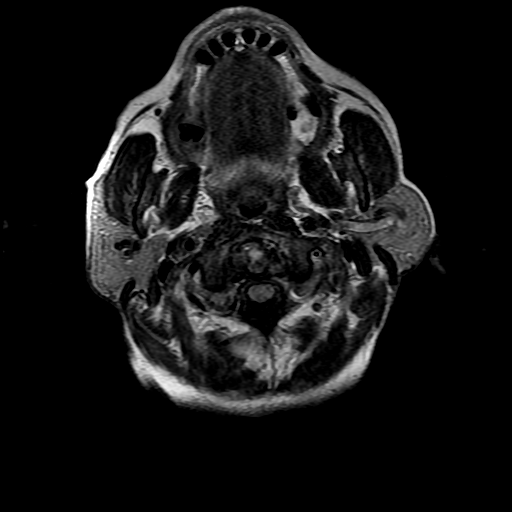
[im 23/23]
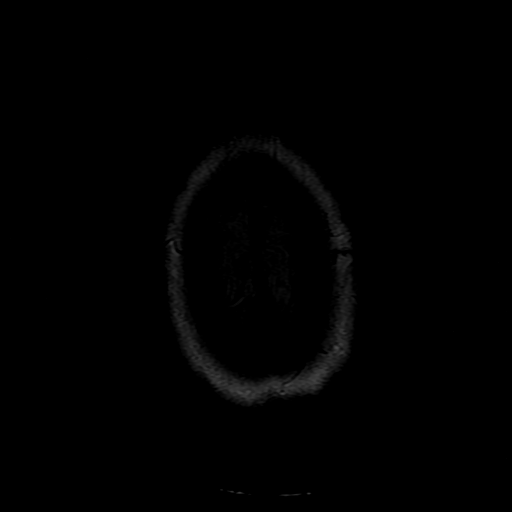

[Series 7: DWI · coronal · 5.0mm · 1.09mm/px · 7 of 68 slices shown (2 of 4)]
[im 1/68]
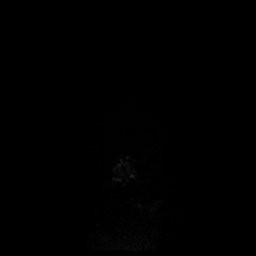
[im 12/68]
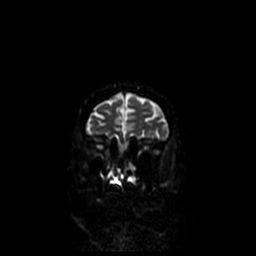
[im 23/68]
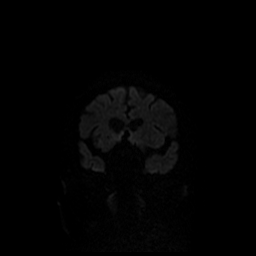
[im 34/68]
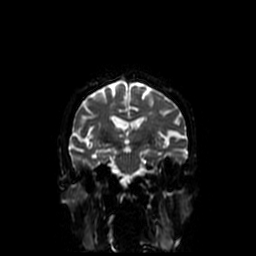
[im 45/68]
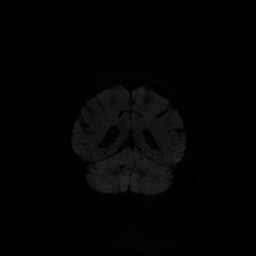
[im 56/68]
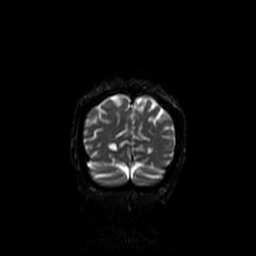
[im 68/68]
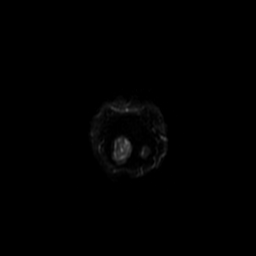

[Series 8: ax mpgr · axial · 5.0mm · 0.43mm/px · 1 of 23 slices shown]
[im 1/23]
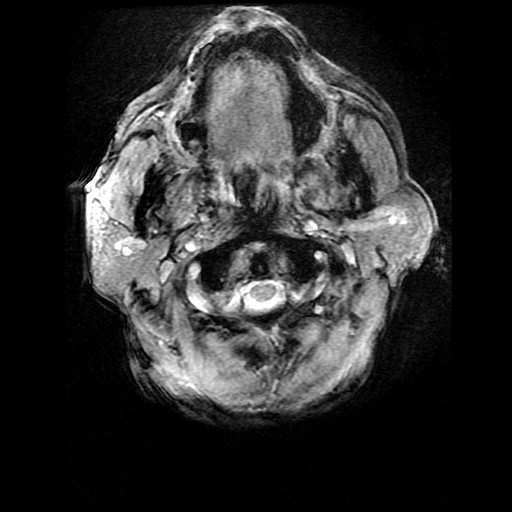

[Series 11: T2 · coronal · 5.0mm · 0.43mm/px · 3 of 29 slices shown (2 of 2)]
[im 1/29]
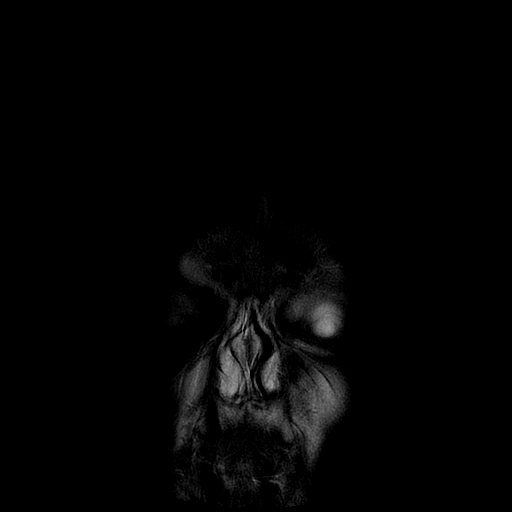
[im 15/29]
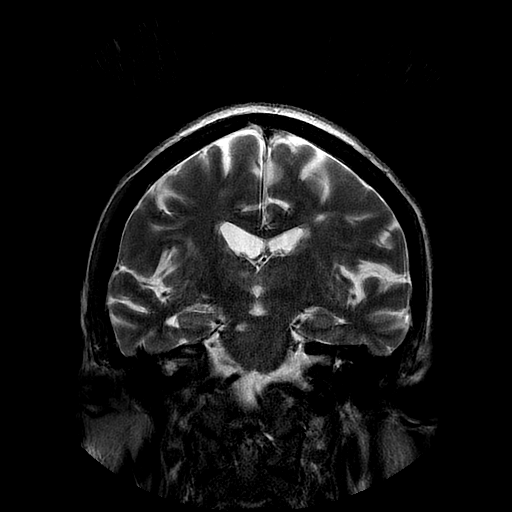
[im 29/29]
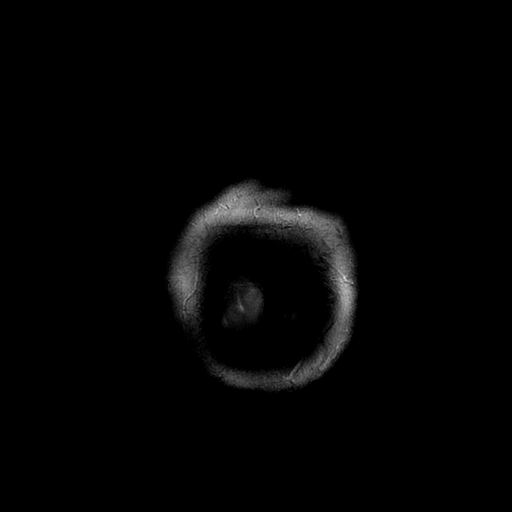

[Series 400: DWI · axial · 3.0mm · 1.09mm/px · z∈[-112,+20]mm · 5 of 45 slices shown (3 of 4)]
[im 1/45]
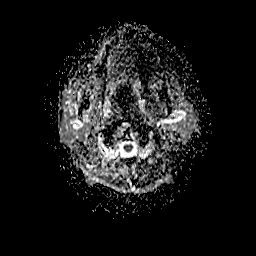
[im 12/45]
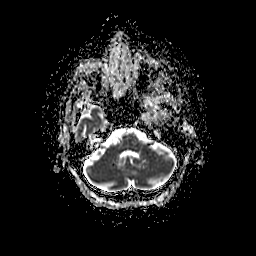
[im 23/45]
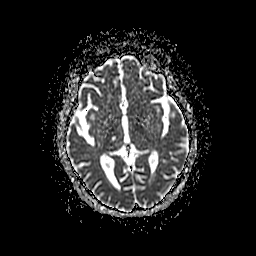
[im 34/45]
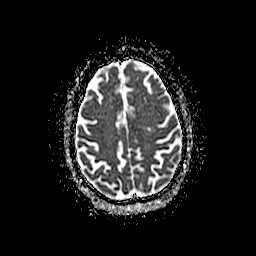
[im 45/45]
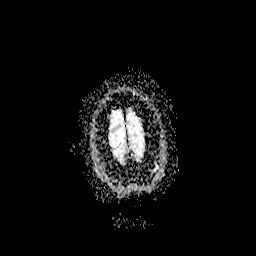

[Series 700: DWI · coronal · 5.0mm · 1.09mm/px · 4 of 34 slices shown (4 of 4)]
[im 1/34]
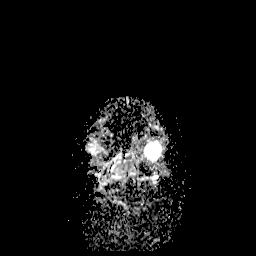
[im 12/34]
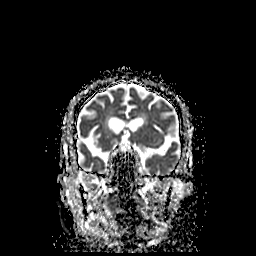
[im 23/34]
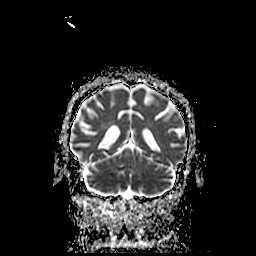
[im 34/34]
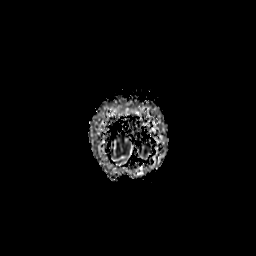

[35 of 48 positions shown; findings below may reference images not displayed]

FINDINGS: INTRACRANIAL CONTENTS: No reduced diffusion to suggest acute
ischemia. No susceptibility artifact to suggest hemorrhage.
Scattered micro hemorrhages in the supra and infratentorial brain
including bilateral thalamus, RIGHT pons were present previously.
The ventricles and sulci are normal for patient's age. Patchy
supratentorial and pontine white matter FLAIR T2 hyperintensities.
Old bilateral thalamus and basal ganglia lacunar infarcts. No
suspicious parenchymal signal, masses or mass effect. No abnormal
extra-axial fluid collections. No extra-axial masses though,
contrast enhanced sequences would be more sensitive. Normal major
intracranial vascular flow voids present at skull base. Ectatic
RIGHT basilar artery flow void unchanged.

ORBITS: The included ocular globes and orbital contents are
non-suspicious. Status post bilateral ocular lens implants.

SINUSES: Atretic RIGHT maxillary sinus compatible chronic sinusitis.
No paranasal sinus air-fluid levels. Trace LEFT mastoid effusion.

SKULL/SOFT TISSUES: No abnormal sellar expansion. No suspicious
calvarial bone marrow signal. Craniocervical junction maintained.
ACDF partially imaged.
IMPRESSION: No acute intracranial process, specifically no acute ischemia.

Stable chronic changes including moderate chronic small vessel
ischemic disease and old basal ganglia/thalamus lacunar infarcts.
Similar scattered micro hemorrhages most compatible with chronic
hypertension.

## 2017-02-02 DIAGNOSIS — N186 End stage renal disease: Secondary | ICD-10-CM | POA: Diagnosis not present

## 2017-02-02 DIAGNOSIS — D631 Anemia in chronic kidney disease: Secondary | ICD-10-CM | POA: Diagnosis not present

## 2017-02-02 DIAGNOSIS — N2581 Secondary hyperparathyroidism of renal origin: Secondary | ICD-10-CM | POA: Diagnosis not present

## 2017-02-03 ENCOUNTER — Encounter: Payer: Self-pay | Admitting: Vascular Surgery

## 2017-02-03 ENCOUNTER — Ambulatory Visit (INDEPENDENT_AMBULATORY_CARE_PROVIDER_SITE_OTHER): Payer: Self-pay | Admitting: Vascular Surgery

## 2017-02-03 ENCOUNTER — Encounter (HOSPITAL_COMMUNITY): Payer: Self-pay | Admitting: *Deleted

## 2017-02-03 ENCOUNTER — Other Ambulatory Visit: Payer: Self-pay

## 2017-02-03 VITALS — BP 156/84 | HR 82 | Temp 97.1°F | Resp 16 | Ht 62.0 in | Wt 119.0 lb

## 2017-02-03 DIAGNOSIS — N186 End stage renal disease: Secondary | ICD-10-CM

## 2017-02-03 DIAGNOSIS — T82510D Breakdown (mechanical) of surgically created arteriovenous fistula, subsequent encounter: Secondary | ICD-10-CM

## 2017-02-03 DIAGNOSIS — N2581 Secondary hyperparathyroidism of renal origin: Secondary | ICD-10-CM | POA: Diagnosis not present

## 2017-02-03 DIAGNOSIS — Z992 Dependence on renal dialysis: Secondary | ICD-10-CM

## 2017-02-03 DIAGNOSIS — T82898D Other specified complication of vascular prosthetic devices, implants and grafts, subsequent encounter: Secondary | ICD-10-CM

## 2017-02-03 DIAGNOSIS — D631 Anemia in chronic kidney disease: Secondary | ICD-10-CM | POA: Diagnosis not present

## 2017-02-03 NOTE — Progress Notes (Signed)
Pt denies SOB and chest pain. Pt verbalized understanding of all pre-op instructions.

## 2017-02-03 NOTE — Progress Notes (Signed)
Vitals:   02/03/17 1545  BP: (!) 167/91  Pulse: 82  Resp: 16  Temp: 97.1 F (36.2 C)  SpO2: 98%  Weight: 119 lb (54 kg)  Height: 5\' 2"  (1.575 m)

## 2017-02-03 NOTE — Progress Notes (Signed)
Patient name: [name] MRN: [id number] DOB: 07-Dec-1951 Sex: female  REASON FOR VISIT: Evaluation of expanding hematoma versus false aneurysm left upper extremity  HPI: [name] is a 66 y.o. female here today for evaluation of left upper extremity fistula. She does have a history of left upper arm AV fistula and had a recent plication of this. She was seen in our office on February 2 and things were healing nicely. She is now using her fistula and had removal of her catheter. She reports that earlier this week she had what sounds like a significant infiltration. Since that time she's had continued expansion of her hematoma at the upper segment of her upper arm fistula on the left  Current Outpatient Prescriptions  Medication Sig Dispense Refill  . acetaminophen (TYLENOL) 325 MG tablet Take 650 mg by mouth every 6 (six) hours as needed for mild pain.    Marland Kitchen acetaminophen-codeine (TYLENOL #3) 300-30 MG tablet Take 1-2 tablets by mouth every 6 (six) hours as needed for moderate pain. 15 tablet 0  . allopurinol (ZYLOPRIM) 300 MG tablet Take 150 mg by mouth at bedtime.    . ALPRAZolam (XANAX) 0.25 MG tablet Take 0.25 mg by mouth 2 (two) times daily as needed for anxiety.     Marland Kitchen aspirin EC 325 MG tablet Take 325 mg by mouth at bedtime.    Lorin Picket 1 GM 210 MG(Fe) tablet Take 420 mg by mouth 3 (three) times daily. With meals    . B Complex-C-Folic Acid (DIALYVITE PO) Take 1 tablet by mouth daily.    Marland Kitchen doxazosin (CARDURA) 8 MG tablet Take 1 tablet (8 mg total) by mouth at bedtime.    Marland Kitchen labetalol (NORMODYNE) 200 MG tablet Take 200 mg by mouth 2 (two) times daily.     Marland Kitchen lactulose (CHRONULAC) 10 GM/15ML solution Take 20 g by mouth daily as needed for mild constipation.    Marland Kitchen losartan (COZAAR) 100 MG tablet Take 100 mg by mouth at bedtime.     . meclizine (ANTIVERT) 25 MG tablet Take 1 tablet (25 mg total) by mouth 2 (two) times daily as needed for dizziness.  30 tablet 0  . promethazine (PHENERGAN) 25 MG tablet Take 25 mg by mouth every 6 (six) hours as needed for nausea or vomiting.    . SENSIPAR 30 MG tablet Take 30 mg by mouth at bedtime.    . simvastatin (ZOCOR) 10 MG tablet Take 10 mg by mouth at bedtime.     Marland Kitchen zolpidem (AMBIEN) 10 MG tablet Take 10 mg by mouth at bedtime as needed for sleep.     Marland Kitchen oxyCODONE-acetaminophen (ROXICET) 5-325 MG tablet Take 1 tablet by mouth every 6 (six) hours as needed for severe pain. (Patient not taking: Reported on 02/03/2017) 10 tablet 0  . oxyCODONE-acetaminophen (ROXICET) 5-325 MG tablet Take 1-2 tablets by mouth every 4 (four) hours as needed. (Patient not taking: Reported on 02/03/2017) 20 tablet 0   No current facility-administered medications for this visit.      PHYSICAL EXAM: Vitals:   02/03/17 1545 02/03/17 1548  BP: (!) 167/91 (!) 156/84  Pulse: 82 82  Resp: 16   Temp: 97.1 F (36.2 C)   SpO2: 98%   Weight: 119 lb (54 kg)   Height: 5\' 2"  (1.575 m)     GENERAL: The patient is a well-nourished female, in no acute distress. The vital signs are documented above. She does have a good thrill in her fistula.  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 She did have hemodialysis yesterday and today with her next dialysis being 2 days from now. She will be taken to the operating room tomorrow for exploration and revision.   Rosetta Posner, MD FACS [facility] Tel [phone] Pager [phone]

## 2017-02-04 ENCOUNTER — Ambulatory Visit (HOSPITAL_COMMUNITY): Payer: Medicare Other

## 2017-02-04 ENCOUNTER — Encounter (HOSPITAL_COMMUNITY): Payer: Self-pay | Admitting: *Deleted

## 2017-02-04 ENCOUNTER — Ambulatory Visit (HOSPITAL_COMMUNITY): Payer: Medicare Other | Admitting: Anesthesiology

## 2017-02-04 ENCOUNTER — Encounter (HOSPITAL_COMMUNITY)
Admission: RE | Disposition: A | Discharge status: Home / Self Care | Payer: Self-pay | Source: Ambulatory Visit | Attending: Vascular Surgery

## 2017-02-04 ENCOUNTER — Ambulatory Visit (HOSPITAL_COMMUNITY)
Admission: RE | Admit: 2017-02-04 | Discharge: 2017-02-04 | Disposition: A | Discharge status: Home / Self Care | Payer: Medicare Other | Source: Ambulatory Visit | Attending: Vascular Surgery | Admitting: Vascular Surgery

## 2017-02-04 DIAGNOSIS — Y939 Activity, unspecified: Secondary | ICD-10-CM | POA: Diagnosis not present

## 2017-02-04 DIAGNOSIS — M1990 Unspecified osteoarthritis, unspecified site: Secondary | ICD-10-CM | POA: Diagnosis not present

## 2017-02-04 DIAGNOSIS — I120 Hypertensive chronic kidney disease with stage 5 chronic kidney disease or end stage renal disease: Secondary | ICD-10-CM | POA: Diagnosis not present

## 2017-02-04 DIAGNOSIS — Z95828 Presence of other vascular implants and grafts: Secondary | ICD-10-CM

## 2017-02-04 DIAGNOSIS — Z992 Dependence on renal dialysis: Secondary | ICD-10-CM | POA: Insufficient documentation

## 2017-02-04 DIAGNOSIS — X58XXXA Exposure to other specified factors, initial encounter: Secondary | ICD-10-CM | POA: Diagnosis not present

## 2017-02-04 DIAGNOSIS — R918 Other nonspecific abnormal finding of lung field: Secondary | ICD-10-CM | POA: Diagnosis not present

## 2017-02-04 DIAGNOSIS — D631 Anemia in chronic kidney disease: Secondary | ICD-10-CM | POA: Diagnosis not present

## 2017-02-04 DIAGNOSIS — I132 Hypertensive heart and chronic kidney disease with heart failure and with stage 5 chronic kidney disease, or end stage renal disease: Secondary | ICD-10-CM | POA: Insufficient documentation

## 2017-02-04 DIAGNOSIS — Z7982 Long term (current) use of aspirin: Secondary | ICD-10-CM | POA: Diagnosis not present

## 2017-02-04 DIAGNOSIS — T82898A Other specified complication of vascular prosthetic devices, implants and grafts, initial encounter: Secondary | ICD-10-CM | POA: Insufficient documentation

## 2017-02-04 DIAGNOSIS — Z87891 Personal history of nicotine dependence: Secondary | ICD-10-CM | POA: Diagnosis not present

## 2017-02-04 DIAGNOSIS — N186 End stage renal disease: Secondary | ICD-10-CM | POA: Diagnosis not present

## 2017-02-04 DIAGNOSIS — Z79899 Other long term (current) drug therapy: Secondary | ICD-10-CM | POA: Diagnosis not present

## 2017-02-04 DIAGNOSIS — Z8673 Personal history of transient ischemic attack (TIA), and cerebral infarction without residual deficits: Secondary | ICD-10-CM | POA: Insufficient documentation

## 2017-02-04 DIAGNOSIS — I509 Heart failure, unspecified: Secondary | ICD-10-CM | POA: Insufficient documentation

## 2017-02-04 DIAGNOSIS — I721 Aneurysm of artery of upper extremity: Secondary | ICD-10-CM | POA: Diagnosis not present

## 2017-02-04 DIAGNOSIS — Z419 Encounter for procedure for purposes other than remedying health state, unspecified: Secondary | ICD-10-CM

## 2017-02-04 HISTORY — PX: INSERTION OF DIALYSIS CATHETER: SHX1324

## 2017-02-04 HISTORY — DX: Other specified complication of vascular prosthetic devices, implants and grafts, initial encounter: T82.898A

## 2017-02-04 HISTORY — PX: REVISON OF ARTERIOVENOUS FISTULA: SHX6074

## 2017-02-04 HISTORY — DX: Breakdown (mechanical) of surgically created arteriovenous fistula, initial encounter: T82.510A

## 2017-02-04 LAB — POCT I-STAT 4, (NA,K, GLUC, HGB,HCT)
Glucose, Bld: 85 mg/dL (ref 65–99)
HCT: 34 % — ABNORMAL LOW (ref 36.0–46.0)
Hemoglobin: 11.6 g/dL — ABNORMAL LOW (ref 12.0–15.0)
Potassium: 3.9 mmol/L (ref 3.5–5.1)
Sodium: 137 mmol/L (ref 135–145)

## 2017-02-04 SURGERY — REVISON OF ARTERIOVENOUS FISTULA
Anesthesia: General | Site: Chest | Laterality: Right

## 2017-02-04 MED ORDER — PROTAMINE SULFATE 10 MG/ML IV SOLN
INTRAVENOUS | Status: DC | PRN
  Administered 2017-02-04 (×4): 10 mg via INTRAVENOUS

## 2017-02-04 MED ORDER — FENTANYL CITRATE (PF) 100 MCG/2ML IJ SOLN
INTRAMUSCULAR | Status: AC
  Filled 2017-02-04: qty 2

## 2017-02-04 MED ORDER — ONDANSETRON HCL 4 MG/2ML IJ SOLN
INTRAMUSCULAR | Status: AC
  Filled 2017-02-04: qty 2

## 2017-02-04 MED ORDER — HEPARIN SODIUM (PORCINE) 1000 UNIT/ML IJ SOLN
INTRAMUSCULAR | Status: DC | PRN
  Administered 2017-02-04: 5000 [IU] via INTRAVENOUS

## 2017-02-04 MED ORDER — CHLORHEXIDINE GLUCONATE CLOTH 2 % EX PADS: 6.0000 | MEDICATED_PAD | Freq: Once | CUTANEOUS | Status: DC

## 2017-02-04 MED ORDER — DEXTROSE 5 % IV SOLN
INTRAVENOUS | Status: AC
  Filled 2017-02-04: qty 1.5

## 2017-02-04 MED ORDER — SODIUM CHLORIDE 0.9 % IR SOLN
Status: DC | PRN
  Administered 2017-02-04: 1000 mL

## 2017-02-04 MED ORDER — HEPARIN SODIUM (PORCINE) 1000 UNIT/ML IJ SOLN
INTRAMUSCULAR | Status: AC
  Filled 2017-02-04: qty 1

## 2017-02-04 MED ORDER — PROPOFOL 10 MG/ML IV BOLUS
INTRAVENOUS | Status: DC | PRN
  Administered 2017-02-04: 40 mg via INTRAVENOUS
  Administered 2017-02-04: 30 mg via INTRAVENOUS
  Administered 2017-02-04: 10 mg via INTRAVENOUS
  Administered 2017-02-04: 120 mg via INTRAVENOUS

## 2017-02-04 MED ORDER — LIDOCAINE-EPINEPHRINE (PF) 1 %-1:200000 IJ SOLN
INTRAMUSCULAR | Status: AC
  Filled 2017-02-04: qty 30

## 2017-02-04 MED ORDER — LIDOCAINE 2% (20 MG/ML) 5 ML SYRINGE
INTRAMUSCULAR | Status: AC
  Filled 2017-02-04: qty 5

## 2017-02-04 MED ORDER — EPHEDRINE 5 MG/ML INJ
INTRAVENOUS | Status: AC
  Filled 2017-02-04: qty 20

## 2017-02-04 MED ORDER — PHENYLEPHRINE HCL 10 MG/ML IJ SOLN
INTRAMUSCULAR | Status: DC | PRN
  Administered 2017-02-04: 40 ug via INTRAVENOUS
  Administered 2017-02-04: 80 ug via INTRAVENOUS
  Administered 2017-02-04: 120 ug via INTRAVENOUS
  Administered 2017-02-04: 80 ug via INTRAVENOUS
  Administered 2017-02-04 (×2): 40 ug via INTRAVENOUS

## 2017-02-04 MED ORDER — PHENYLEPHRINE 40 MCG/ML (10ML) SYRINGE FOR IV PUSH (FOR BLOOD PRESSURE SUPPORT)
PREFILLED_SYRINGE | INTRAVENOUS | Status: AC
  Filled 2017-02-04: qty 20

## 2017-02-04 MED ORDER — ROCURONIUM BROMIDE 50 MG/5ML IV SOSY
PREFILLED_SYRINGE | INTRAVENOUS | Status: AC
  Filled 2017-02-04: qty 25

## 2017-02-04 MED ORDER — IOPAMIDOL (ISOVUE-300) INJECTION 61%
INTRAVENOUS | Status: AC
  Filled 2017-02-04: qty 50

## 2017-02-04 MED ORDER — ACETAMINOPHEN-CODEINE #3 300-30 MG PO TABS: 1.0000 | ORAL_TABLET | Freq: Four times a day (QID) | ORAL | 0 refills | Status: DC | PRN

## 2017-02-04 MED ORDER — ONDANSETRON HCL 4 MG/2ML IJ SOLN
INTRAMUSCULAR | Status: DC | PRN
  Administered 2017-02-04: 4 mg via INTRAVENOUS

## 2017-02-04 MED ORDER — FENTANYL CITRATE (PF) 100 MCG/2ML IJ SOLN: 25.0000 ug | INTRAMUSCULAR | Status: DC | PRN

## 2017-02-04 MED ORDER — SUCCINYLCHOLINE CHLORIDE 200 MG/10ML IV SOSY
PREFILLED_SYRINGE | INTRAVENOUS | Status: AC
  Filled 2017-02-04: qty 10

## 2017-02-04 MED ORDER — PHENYLEPHRINE HCL 10 MG/ML IJ SOLN
INTRAVENOUS | Status: DC | PRN
  Administered 2017-02-04: 30 ug/min via INTRAVENOUS

## 2017-02-04 MED ORDER — LIDOCAINE HCL (CARDIAC) 20 MG/ML IV SOLN
INTRAVENOUS | Status: DC | PRN
  Administered 2017-02-04: 80 mg via INTRAVENOUS

## 2017-02-04 MED ORDER — FENTANYL CITRATE (PF) 100 MCG/2ML IJ SOLN
INTRAMUSCULAR | Status: DC | PRN
Start: 2017-02-04 — End: 2017-02-04
  Administered 2017-02-04: 50 ug via INTRAVENOUS
  Administered 2017-02-04 (×2): 25 ug via INTRAVENOUS

## 2017-02-04 MED ORDER — DEXTROSE 5 % IV SOLN
1.5000 g | INTRAVENOUS | Status: AC
  Administered 2017-02-04: 1.5 g via INTRAVENOUS

## 2017-02-04 MED ORDER — SODIUM CHLORIDE 0.9 % IV SOLN
INTRAVENOUS | Status: DC | PRN
  Administered 2017-02-04: 14:00:00

## 2017-02-04 MED ORDER — SODIUM CHLORIDE 0.9 % IV SOLN
INTRAVENOUS | Status: DC
  Administered 2017-02-04: 20 mL/h via INTRAVENOUS
  Administered 2017-02-04 (×2): via INTRAVENOUS

## 2017-02-04 SURGICAL SUPPLY — 50 items
ADH SKN CLS APL DERMABOND .7 (GAUZE/BANDAGES/DRESSINGS) ×2
ARMBAND PINK RESTRICT EXTREMIT (MISCELLANEOUS) ×3 IMPLANT
BANDAGE ESMARK 6X9 LF (GAUZE/BANDAGES/DRESSINGS) IMPLANT
BIOPATCH RED 1 DISK 7.0 (GAUZE/BANDAGES/DRESSINGS) ×1 IMPLANT
BNDG CMPR 9X6 STRL LF SNTH (GAUZE/BANDAGES/DRESSINGS) ×2
BNDG ESMARK 6X9 LF (GAUZE/BANDAGES/DRESSINGS) ×3
CANISTER SUCTION 2500CC (MISCELLANEOUS) ×3 IMPLANT
CANNULA VESSEL 3MM 2 BLNT TIP (CANNULA) ×3 IMPLANT
CATH PALINDROME RT-P 15FX19CM (CATHETERS) ×1 IMPLANT
CLIP TI MEDIUM 6 (CLIP) ×3 IMPLANT
CLIP TI WIDE RED SMALL 6 (CLIP) ×3 IMPLANT
COVER PROBE W GEL 5X96 (DRAPES) ×3 IMPLANT
CUFF TOURNIQUET SINGLE 18IN (TOURNIQUET CUFF) ×1 IMPLANT
DERMABOND ADVANCED (GAUZE/BANDAGES/DRESSINGS) ×1
DERMABOND ADVANCED .7 DNX12 (GAUZE/BANDAGES/DRESSINGS) ×2 IMPLANT
DRAPE C-ARM 42X72 X-RAY (DRAPES) ×1 IMPLANT
DRAPE CHEST BREAST 15X10 FENES (DRAPES) ×1 IMPLANT
ELECT REM PT RETURN 9FT ADLT (ELECTROSURGICAL) ×3
ELECTRODE REM PT RTRN 9FT ADLT (ELECTROSURGICAL) ×2 IMPLANT
GAUZE SPONGE 4X4 12PLY STRL (GAUZE/BANDAGES/DRESSINGS) ×1 IMPLANT
GLOVE BIO SURGEON STRL SZ 6.5 (GLOVE) ×2 IMPLANT
GLOVE BIO SURGEON STRL SZ7.5 (GLOVE) ×3 IMPLANT
GLOVE BIOGEL PI IND STRL 6.5 (GLOVE) IMPLANT
GLOVE BIOGEL PI IND STRL 7.0 (GLOVE) IMPLANT
GLOVE BIOGEL PI IND STRL 8 (GLOVE) ×2 IMPLANT
GLOVE BIOGEL PI INDICATOR 6.5 (GLOVE) ×1
GLOVE BIOGEL PI INDICATOR 7.0 (GLOVE) ×3
GLOVE BIOGEL PI INDICATOR 8 (GLOVE) ×1
GLOVE ECLIPSE 6.5 STRL STRAW (GLOVE) ×1 IMPLANT
GOWN STRL REUS W/ TWL LRG LVL3 (GOWN DISPOSABLE) ×6 IMPLANT
GOWN STRL REUS W/TWL LRG LVL3 (GOWN DISPOSABLE) ×15
KIT BASIN OR (CUSTOM PROCEDURE TRAY) ×3 IMPLANT
KIT ROOM TURNOVER OR (KITS) ×3 IMPLANT
NS IRRIG 1000ML POUR BTL (IV SOLUTION) ×3 IMPLANT
PACK CV ACCESS (CUSTOM PROCEDURE TRAY) ×3 IMPLANT
PAD ARMBOARD 7.5X6 YLW CONV (MISCELLANEOUS) ×6 IMPLANT
SPONGE GAUZE 4X4 12PLY STER LF (GAUZE/BANDAGES/DRESSINGS) ×1 IMPLANT
STAPLER VISISTAT 35W (STAPLE) ×1 IMPLANT
SUT ETHILON 3 0 PS 1 (SUTURE) ×1 IMPLANT
SUT MNCRL AB 4-0 PS2 18 (SUTURE) ×1 IMPLANT
SUT PROLENE 5 0 C 1 24 (SUTURE) ×2 IMPLANT
SUT PROLENE 6 0 BV (SUTURE) ×3 IMPLANT
SUT VIC AB 3-0 SH 27 (SUTURE) ×6
SUT VIC AB 3-0 SH 27X BRD (SUTURE) ×2 IMPLANT
SUT VICRYL 4-0 PS2 18IN ABS (SUTURE) ×3 IMPLANT
SYR 20CC LL (SYRINGE) ×1 IMPLANT
SYR 5ML LL (SYRINGE) ×1 IMPLANT
TAPE CLOTH SURG 4X10 WHT LF (GAUZE/BANDAGES/DRESSINGS) ×2 IMPLANT
UNDERPAD 30X30 (UNDERPADS AND DIAPERS) ×3 IMPLANT
WATER STERILE IRR 1000ML POUR (IV SOLUTION) ×3 IMPLANT

## 2017-02-04 NOTE — Transfer of Care (Signed)
Immediate Anesthesia Transfer of Care Note  Patient: BRINKLEE CISSE  Procedure(s) Performed: Procedure(s): REVISON OF LEFT UPPER ARM ARTERIOVENOUS FISTULA (Left) INSERTION OF DIALYSIS CATHETER (Right)  Patient Location: PACU  Anesthesia Type:General  Level of Consciousness: awake, alert  and patient cooperative  Airway & Oxygen Therapy: Patient Spontanous Breathing  Post-op Assessment: Report given to RN and Post -op Vital signs reviewed and stable  Post vital signs: Reviewed and stable  Last Vitals:  Vitals:   02/04/17 1214  BP: (!) 186/88  Pulse: 68  Resp: 16  Temp: 36.5 C    Last Pain:  Vitals:   02/04/17 1214  TempSrc: Oral      Patients Stated Pain Goal: 4 (16/38/45 3646)  Complications: No apparent anesthesia complications

## 2017-02-04 NOTE — Anesthesia Postprocedure Evaluation (Addendum)
Anesthesia Post Note  Patient: KASHONDA SARKISYAN  Procedure(s) Performed: Procedure(s) (LRB): REVISON OF LEFT UPPER ARM ARTERIOVENOUS FISTULA (Left) INSERTION OF DIALYSIS CATHETER (Right)  Patient location during evaluation: PACU Anesthesia Type: General Level of consciousness: awake, sedated and oriented Pain management: pain level controlled Vital Signs Assessment: post-procedure vital signs reviewed and stable Respiratory status: spontaneous breathing, nonlabored ventilation, respiratory function stable and patient connected to nasal cannula oxygen Cardiovascular status: blood pressure returned to baseline and stable Postop Assessment: no signs of nausea or vomiting Anesthetic complications: no       Last Vitals:  Vitals:   02/04/17 1615 02/04/17 1628  BP: (!) 152/88 (!) 143/90  Pulse: 64 67  Resp:  12  Temp:  36.3 C    Last Pain:  Vitals:   02/04/17 1615  TempSrc:   PainSc: Asleep                 Adelise Buswell,JAMES TERRILL

## 2017-02-04 NOTE — Anesthesia Preprocedure Evaluation (Signed)
Anesthesia Evaluation  Patient identified by MRN, date of birth, ID band Patient awake    Reviewed: Allergy & Precautions, NPO status   Airway Mallampati: II  TM Distance: >3 FB Neck ROM: Full    Dental  (+) Teeth Intact   Pulmonary shortness of breath, pneumonia, former smoker,    breath sounds clear to auscultation       Cardiovascular hypertension, + Cardiac Stents and +CHF   Rhythm:Regular Rate:Normal     Neuro/Psych  Neuromuscular disease CVA    GI/Hepatic Neg liver ROS, GERD  ,  Endo/Other  negative endocrine ROS  Renal/GU Renal disease     Musculoskeletal  (+) Arthritis ,   Abdominal   Peds  Hematology  (+) anemia ,   Anesthesia Other Findings   Reproductive/Obstetrics                             Anesthesia Physical Anesthesia Plan  ASA: III  Anesthesia Plan: General   Post-op Pain Management:    Induction: Intravenous  Airway Management Planned: LMA  Additional Equipment:   Intra-op Plan:   Post-operative Plan: Extubation in OR  Informed Consent: I have reviewed the patients History and Physical, chart, labs and discussed the procedure including the risks, benefits and alternatives for the proposed anesthesia with the patient or authorized representative who has indicated his/her understanding and acceptance.   Dental advisory given  Plan Discussed with: CRNA  Anesthesia Plan Comments:         Anesthesia Quick Evaluation

## 2017-02-04 NOTE — H&P (Signed)
HPI: [name] is a 66 y.o. female here today for evaluation of left upper extremity fistula. She does have a history of left upper arm AV fistula and had a recent plication of this. She was seen in our office on February 2 and things were healing nicely. She is now using her fistula and had removal of her catheter. She reports that earlier this week she had what sounds like a significant infiltration. Since that time she's had continued expansion of her hematoma at the upper segment of her upper arm fistula on the left        Current Outpatient Prescriptions  Medication Sig Dispense Refill  . acetaminophen (TYLENOL) 325 MG tablet Take 650 mg by mouth every 6 (six) hours as needed for mild pain.    Marland Kitchen acetaminophen-codeine (TYLENOL #3) 300-30 MG tablet Take 1-2 tablets by mouth every 6 (six) hours as needed for moderate pain. 15 tablet 0  . allopurinol (ZYLOPRIM) 300 MG tablet Take 150 mg by mouth at bedtime.    . ALPRAZolam (XANAX) 0.25 MG tablet Take 0.25 mg by mouth 2 (two) times daily as needed for anxiety.     Marland Kitchen aspirin EC 325 MG tablet Take 325 mg by mouth at bedtime.    Lorin Picket 1 GM 210 MG(Fe) tablet Take 420 mg by mouth 3 (three) times daily. With meals    . B Complex-C-Folic Acid (DIALYVITE PO) Take 1 tablet by mouth daily.    Marland Kitchen doxazosin (CARDURA) 8 MG tablet Take 1 tablet (8 mg total) by mouth at bedtime.    Marland Kitchen labetalol (NORMODYNE) 200 MG tablet Take 200 mg by mouth 2 (two) times daily.     Marland Kitchen lactulose (CHRONULAC) 10 GM/15ML solution Take 20 g by mouth daily as needed for mild constipation.    Marland Kitchen losartan (COZAAR) 100 MG tablet Take 100 mg by mouth at bedtime.     . meclizine (ANTIVERT) 25 MG tablet Take 1 tablet (25 mg total) by mouth 2 (two) times daily as needed for dizziness. 30 tablet 0  . promethazine (PHENERGAN) 25 MG tablet Take 25 mg by mouth every 6 (six) hours as needed for nausea or vomiting.    . SENSIPAR 30 MG tablet Take 30 mg by mouth  at bedtime.    . simvastatin (ZOCOR) 10 MG tablet Take 10 mg by mouth at bedtime.     Marland Kitchen zolpidem (AMBIEN) 10 MG tablet Take 10 mg by mouth at bedtime as needed for sleep.     Marland Kitchen oxyCODONE-acetaminophen (ROXICET) 5-325 MG tablet Take 1 tablet by mouth every 6 (six) hours as needed for severe pain. (Patient not taking: Reported on 02/03/2017) 10 tablet 0  . oxyCODONE-acetaminophen (ROXICET) 5-325 MG tablet Take 1-2 tablets by mouth every 4 (four) hours as needed. (Patient not taking: Reported on 02/03/2017) 20 tablet 0   No current facility-administered medications for this visit.      PHYSICAL EXAM:     Vitals:   02/03/17 1545 02/03/17 1548  BP: (!) 167/91 (!) 156/84  Pulse: 82 82  Resp: 16   Temp: 97.1 F (36.2 C)   SpO2: 98%   Weight: 119 lb (54 kg)   Height: 5\' 2"  (1.575 m)     GENERAL: The patient is a well-nourished female, in no acute distress. The vital signs are documented above. She does have a good thrill in her fistula. 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 Plan for operative revision and likely placement of tunneled catheter. She understands risks of bleeding, malfunction of av fistula and blood vessel injury.   Rakan Soffer C. Donzetta Matters, MD [facility] Office: [phone] Pager: [phone]

## 2017-02-04 NOTE — Op Note (Signed)
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 Randie Heinz, MD Assistant: Karsten Ro, PA Procedure Performed: 1.  Revision of left upper arm brachiocephalic av fistula with resection of pseudoaneurysm 2.  Right IJ tunneled dialysis catheter placement with ultrasound guidance  Indications:  66yo AAF with esrd on dialysis via left arm av fistula with recent revision of a pseudoaneurysm. She now has an infiltration event of another pseudoaneurysm and is indicated for revision and tunneled catheter placement.   Findings: There was a large pseudoaneurysm of the AV fistula with significant fresh hematoma within it. There was also a ulceration of the skin which was resected. The neck of the pseudoaneurysm was very narrow was oversewn with 5-0 Prolene suture. At completion there was thrill in the vein palpable radial pulse. The tunneled catheter was placed terminating in the superior vena cava to allow rest of the left upper extremity.   Procedure:  The patient was identified in the holding area and taken to the operating room where she was placed supine on the operating table general anesthesia was induced she was given antibiotics sterilely prepped and draped in her left upper extremity timeout called. We began by giving the patient 5000 units of heparin. A tourniquet was placed an Esmarch used to exsanguinate the left upper extremity. Tourniquet was inflated to 250 mmHg. She will incision was then made overlying the pseudoaneurysm. I dissected down on the pseudoaneurysm with electrocautery and then dissected around it. There was one area of the skin that was densely adherent ulcerated this was excised and we were then inside the pseudoaneurysm cavity. There was one very small pinhole leading to the fistula that was  oversewn with 5-0 Prolene suture. The tourniquet was then released and there was no bleeding into the pseudoaneurysm cavity. The cavity was then excised with electrocautery hemostasis obtained in the wound. Patient was given 40 mg of protamine wound was irrigated. Subcutaneous tissue was approximated with 3-0 Vicryl suture overlying the fistula and the skin was stapled and sterile dressing applied. Drapes were then removed. Attention was turned to the neck and chest area which was prepped and draped in a sterile fashion. Ultrasound guidance was used to identify the right internal control jugular vein which was noted to have some chronic thrombus but was patent and compressible. This was cannulated with 18-gauge needle and wire was placed into the inferior vena cava under fluoroscopic guidance. A counterincision was made and a 19 cm tunneled catheter was then tunneled between the 2 incisions. Over the wire the tract was serially dilated and introducer sheath placed under fluoroscopic guidance. Catheter was then placed and sheath peeled away. Fluoroscopy demonstrated termination of the superior vena cava with smooth curve over the clavicle. Components were then assembled to the catheter and flushed and withdrew easily. 1.5 mL of heparin was instilled in each port and caps were applied. It was affixed with 3-0 nylon suture neck incision was closed with 4-0 Monocryl. Patient was allowed awaken from anesthesia having tolerated the procedure well without immediate complication.   Camaria Gerald C. Randie Heinz, MD [facility] Office: [phone] Pager: [phone]

## 2017-02-05 ENCOUNTER — Encounter (HOSPITAL_COMMUNITY): Payer: Self-pay | Admitting: Vascular Surgery

## 2017-02-05 ENCOUNTER — Telehealth: Payer: Self-pay | Admitting: Vascular Surgery

## 2017-02-05 DIAGNOSIS — N2581 Secondary hyperparathyroidism of renal origin: Secondary | ICD-10-CM | POA: Diagnosis not present

## 2017-02-05 DIAGNOSIS — D631 Anemia in chronic kidney disease: Secondary | ICD-10-CM | POA: Diagnosis not present

## 2017-02-05 DIAGNOSIS — N186 End stage renal disease: Secondary | ICD-10-CM | POA: Diagnosis not present

## 2017-02-05 NOTE — Telephone Encounter (Signed)
-----   Message from Mena Goes, RN sent at 02/04/2017  3:16 PM EST ----- Regarding: schedule   ----- Message ----- From: Alvia Grove, PA-C Sent: 02/04/2017   3:10 PM To: Vvs Charge Pool  S/p revision left upper arm fistula and TDC insertion 02/04/17  F/u with PA on BCC day in 2 weeks.   Thanks Maudie Mercury

## 2017-02-05 NOTE — Telephone Encounter (Signed)
Scheduled 3/9 @ 1:30 pm

## 2017-02-08 DIAGNOSIS — N2581 Secondary hyperparathyroidism of renal origin: Secondary | ICD-10-CM | POA: Diagnosis not present

## 2017-02-08 DIAGNOSIS — N186 End stage renal disease: Secondary | ICD-10-CM | POA: Diagnosis not present

## 2017-02-08 DIAGNOSIS — D631 Anemia in chronic kidney disease: Secondary | ICD-10-CM | POA: Diagnosis not present

## 2017-02-10 DIAGNOSIS — N186 End stage renal disease: Secondary | ICD-10-CM | POA: Diagnosis not present

## 2017-02-10 DIAGNOSIS — D631 Anemia in chronic kidney disease: Secondary | ICD-10-CM | POA: Diagnosis not present

## 2017-02-10 DIAGNOSIS — T8612 Kidney transplant failure: Secondary | ICD-10-CM | POA: Diagnosis not present

## 2017-02-10 DIAGNOSIS — Z992 Dependence on renal dialysis: Secondary | ICD-10-CM | POA: Diagnosis not present

## 2017-02-10 DIAGNOSIS — N2581 Secondary hyperparathyroidism of renal origin: Secondary | ICD-10-CM | POA: Diagnosis not present

## 2017-02-12 ENCOUNTER — Encounter: Payer: Self-pay | Admitting: Vascular Surgery

## 2017-02-12 DIAGNOSIS — D631 Anemia in chronic kidney disease: Secondary | ICD-10-CM | POA: Diagnosis not present

## 2017-02-12 DIAGNOSIS — N2581 Secondary hyperparathyroidism of renal origin: Secondary | ICD-10-CM | POA: Diagnosis not present

## 2017-02-12 DIAGNOSIS — N186 End stage renal disease: Secondary | ICD-10-CM | POA: Diagnosis not present

## 2017-02-15 DIAGNOSIS — N2581 Secondary hyperparathyroidism of renal origin: Secondary | ICD-10-CM | POA: Diagnosis not present

## 2017-02-15 DIAGNOSIS — N186 End stage renal disease: Secondary | ICD-10-CM | POA: Diagnosis not present

## 2017-02-15 DIAGNOSIS — D631 Anemia in chronic kidney disease: Secondary | ICD-10-CM | POA: Diagnosis not present

## 2017-02-17 DIAGNOSIS — D631 Anemia in chronic kidney disease: Secondary | ICD-10-CM | POA: Diagnosis not present

## 2017-02-17 DIAGNOSIS — N186 End stage renal disease: Secondary | ICD-10-CM | POA: Diagnosis not present

## 2017-02-17 DIAGNOSIS — N2581 Secondary hyperparathyroidism of renal origin: Secondary | ICD-10-CM | POA: Diagnosis not present

## 2017-02-19 ENCOUNTER — Encounter: Payer: Self-pay | Admitting: Vascular Surgery

## 2017-02-19 DIAGNOSIS — D631 Anemia in chronic kidney disease: Secondary | ICD-10-CM | POA: Diagnosis not present

## 2017-02-19 DIAGNOSIS — N2581 Secondary hyperparathyroidism of renal origin: Secondary | ICD-10-CM | POA: Diagnosis not present

## 2017-02-19 DIAGNOSIS — N186 End stage renal disease: Secondary | ICD-10-CM | POA: Diagnosis not present

## 2017-02-23 ENCOUNTER — Emergency Department (HOSPITAL_COMMUNITY): Payer: Medicare Other

## 2017-02-23 ENCOUNTER — Inpatient Hospital Stay (HOSPITAL_COMMUNITY)
Admission: EM | Admit: 2017-02-23 | Discharge: 2017-02-26 | DRG: 064 | Disposition: A | Discharge status: Home / Self Care | Payer: Medicare Other | Attending: Family Medicine | Admitting: Family Medicine

## 2017-02-23 ENCOUNTER — Inpatient Hospital Stay (HOSPITAL_COMMUNITY): Payer: Medicare Other

## 2017-02-23 DIAGNOSIS — D631 Anemia in chronic kidney disease: Secondary | ICD-10-CM | POA: Diagnosis not present

## 2017-02-23 DIAGNOSIS — I953 Hypotension of hemodialysis: Secondary | ICD-10-CM | POA: Diagnosis present

## 2017-02-23 DIAGNOSIS — K219 Gastro-esophageal reflux disease without esophagitis: Secondary | ICD-10-CM | POA: Diagnosis present

## 2017-02-23 DIAGNOSIS — I639 Cerebral infarction, unspecified: Secondary | ICD-10-CM

## 2017-02-23 DIAGNOSIS — Z7902 Long term (current) use of antithrombotics/antiplatelets: Secondary | ICD-10-CM | POA: Diagnosis not present

## 2017-02-23 DIAGNOSIS — I120 Hypertensive chronic kidney disease with stage 5 chronic kidney disease or end stage renal disease: Secondary | ICD-10-CM | POA: Diagnosis not present

## 2017-02-23 DIAGNOSIS — R29700 NIHSS score 0: Secondary | ICD-10-CM | POA: Diagnosis present

## 2017-02-23 DIAGNOSIS — Z8673 Personal history of transient ischemic attack (TIA), and cerebral infarction without residual deficits: Secondary | ICD-10-CM

## 2017-02-23 DIAGNOSIS — T8612 Kidney transplant failure: Secondary | ICD-10-CM | POA: Diagnosis present

## 2017-02-23 DIAGNOSIS — R42 Dizziness and giddiness: Secondary | ICD-10-CM | POA: Diagnosis not present

## 2017-02-23 DIAGNOSIS — E785 Hyperlipidemia, unspecified: Secondary | ICD-10-CM | POA: Diagnosis not present

## 2017-02-23 DIAGNOSIS — Z87891 Personal history of nicotine dependence: Secondary | ICD-10-CM

## 2017-02-23 DIAGNOSIS — I132 Hypertensive heart and chronic kidney disease with heart failure and with stage 5 chronic kidney disease, or end stage renal disease: Secondary | ICD-10-CM | POA: Diagnosis present

## 2017-02-23 DIAGNOSIS — F419 Anxiety disorder, unspecified: Secondary | ICD-10-CM | POA: Diagnosis present

## 2017-02-23 DIAGNOSIS — E875 Hyperkalemia: Secondary | ICD-10-CM | POA: Diagnosis present

## 2017-02-23 DIAGNOSIS — M898X9 Other specified disorders of bone, unspecified site: Secondary | ICD-10-CM | POA: Diagnosis present

## 2017-02-23 DIAGNOSIS — G9340 Encephalopathy, unspecified: Secondary | ICD-10-CM | POA: Diagnosis present

## 2017-02-23 DIAGNOSIS — Z981 Arthrodesis status: Secondary | ICD-10-CM

## 2017-02-23 DIAGNOSIS — S0083XA Contusion of other part of head, initial encounter: Secondary | ICD-10-CM | POA: Diagnosis present

## 2017-02-23 DIAGNOSIS — Z882 Allergy status to sulfonamides status: Secondary | ICD-10-CM

## 2017-02-23 DIAGNOSIS — Z992 Dependence on renal dialysis: Secondary | ICD-10-CM | POA: Diagnosis not present

## 2017-02-23 DIAGNOSIS — I10 Essential (primary) hypertension: Secondary | ICD-10-CM | POA: Diagnosis present

## 2017-02-23 DIAGNOSIS — E876 Hypokalemia: Secondary | ICD-10-CM | POA: Diagnosis present

## 2017-02-23 DIAGNOSIS — N2581 Secondary hyperparathyroidism of renal origin: Secondary | ICD-10-CM | POA: Diagnosis present

## 2017-02-23 DIAGNOSIS — I6789 Other cerebrovascular disease: Secondary | ICD-10-CM | POA: Diagnosis not present

## 2017-02-23 DIAGNOSIS — I63321 Cerebral infarction due to thrombosis of right anterior cerebral artery: Secondary | ICD-10-CM | POA: Diagnosis not present

## 2017-02-23 DIAGNOSIS — G629 Polyneuropathy, unspecified: Secondary | ICD-10-CM | POA: Diagnosis present

## 2017-02-23 DIAGNOSIS — R9431 Abnormal electrocardiogram [ECG] [EKG]: Secondary | ICD-10-CM

## 2017-02-23 DIAGNOSIS — Z79899 Other long term (current) drug therapy: Secondary | ICD-10-CM

## 2017-02-23 DIAGNOSIS — N186 End stage renal disease: Secondary | ICD-10-CM | POA: Diagnosis present

## 2017-02-23 DIAGNOSIS — I509 Heart failure, unspecified: Secondary | ICD-10-CM | POA: Diagnosis present

## 2017-02-23 DIAGNOSIS — M810 Age-related osteoporosis without current pathological fracture: Secondary | ICD-10-CM | POA: Diagnosis present

## 2017-02-23 DIAGNOSIS — Z8249 Family history of ischemic heart disease and other diseases of the circulatory system: Secondary | ICD-10-CM

## 2017-02-23 DIAGNOSIS — I619 Nontraumatic intracerebral hemorrhage, unspecified: Secondary | ICD-10-CM | POA: Diagnosis not present

## 2017-02-23 DIAGNOSIS — R4182 Altered mental status, unspecified: Secondary | ICD-10-CM | POA: Diagnosis not present

## 2017-02-23 DIAGNOSIS — N189 Chronic kidney disease, unspecified: Secondary | ICD-10-CM

## 2017-02-23 DIAGNOSIS — Z91048 Other nonmedicinal substance allergy status: Secondary | ICD-10-CM

## 2017-02-23 HISTORY — DX: Cerebral infarction, unspecified: I63.9

## 2017-02-23 LAB — DIFFERENTIAL
Basophils Absolute: 0 10*3/uL (ref 0.0–0.1)
Basophils Relative: 0 %
EOS ABS: 0.6 10*3/uL (ref 0.0–0.7)
EOS PCT: 11 %
LYMPHS ABS: 1.1 10*3/uL (ref 0.7–4.0)
Lymphocytes Relative: 21 %
MONO ABS: 0.5 10*3/uL (ref 0.1–1.0)
MONOS PCT: 9 %
Neutro Abs: 3.1 10*3/uL (ref 1.7–7.7)
Neutrophils Relative %: 59 %

## 2017-02-23 LAB — CBC
HEMATOCRIT: 35.7 % — AB (ref 36.0–46.0)
Hemoglobin: 11.2 g/dL — ABNORMAL LOW (ref 12.0–15.0)
MCH: 28.6 pg (ref 26.0–34.0)
MCHC: 31.4 g/dL (ref 30.0–36.0)
MCV: 91.3 fL (ref 78.0–100.0)
Platelets: 177 10*3/uL (ref 150–400)
RBC: 3.91 MIL/uL (ref 3.87–5.11)
RDW: 18.5 % — ABNORMAL HIGH (ref 11.5–15.5)
WBC: 5.2 10*3/uL (ref 4.0–10.5)

## 2017-02-23 LAB — COMPREHENSIVE METABOLIC PANEL
ALT: 11 U/L — ABNORMAL LOW (ref 14–54)
ANION GAP: 15 (ref 5–15)
AST: 26 U/L (ref 15–41)
Albumin: 3.4 g/dL — ABNORMAL LOW (ref 3.5–5.0)
Alkaline Phosphatase: 87 U/L (ref 38–126)
BILIRUBIN TOTAL: 0.8 mg/dL (ref 0.3–1.2)
BUN: 36 mg/dL — ABNORMAL HIGH (ref 6–20)
CALCIUM: 8.7 mg/dL — AB (ref 8.9–10.3)
CO2: 31 mmol/L (ref 22–32)
Chloride: 93 mmol/L — ABNORMAL LOW (ref 101–111)
Creatinine, Ser: 5.18 mg/dL — ABNORMAL HIGH (ref 0.44–1.00)
GFR calc Af Amer: 9 mL/min — ABNORMAL LOW (ref >60)
GFR, EST NON AFRICAN AMERICAN: 8 mL/min — AB (ref >60)
Glucose, Bld: 140 mg/dL — ABNORMAL HIGH (ref 65–99)
POTASSIUM: 3.4 mmol/L — AB (ref 3.5–5.1)
Sodium: 139 mmol/L (ref 135–145)
TOTAL PROTEIN: 7.3 g/dL (ref 6.5–8.1)

## 2017-02-23 LAB — CBG MONITORING, ED: GLUCOSE-CAPILLARY: 111 mg/dL — AB (ref 65–99)

## 2017-02-23 LAB — I-STAT CHEM 8, ED
BUN: 37 mg/dL — AB (ref 6–20)
CREATININE: 5.3 mg/dL — AB (ref 0.44–1.00)
Calcium, Ion: 0.91 mmol/L — ABNORMAL LOW (ref 1.15–1.40)
Chloride: 94 mmol/L — ABNORMAL LOW (ref 101–111)
GLUCOSE: 137 mg/dL — AB (ref 65–99)
HCT: 37 % (ref 36.0–46.0)
HEMOGLOBIN: 12.6 g/dL (ref 12.0–15.0)
POTASSIUM: 3.4 mmol/L — AB (ref 3.5–5.1)
Sodium: 137 mmol/L (ref 135–145)
TCO2: 27 mmol/L (ref 0–100)

## 2017-02-23 LAB — I-STAT TROPONIN, ED: TROPONIN I, POC: 0.02 ng/mL (ref 0.00–0.08)

## 2017-02-23 LAB — APTT: aPTT: 31 seconds (ref 24–36)

## 2017-02-23 LAB — PROTIME-INR
INR: 1.1
Prothrombin Time: 14.3 seconds (ref 11.4–15.2)

## 2017-02-23 MED ORDER — LACTULOSE 10 GM/15ML PO SOLN
20.0000 g | Freq: Every day | ORAL | Status: DC | PRN
Start: 2017-02-23 — End: 2017-02-26

## 2017-02-23 MED ORDER — ACETAMINOPHEN 325 MG PO TABS: 650.0000 mg | ORAL_TABLET | Freq: Four times a day (QID) | ORAL | Status: DC | PRN

## 2017-02-23 MED ORDER — ZOLPIDEM TARTRATE 5 MG PO TABS
5.0000 mg | ORAL_TABLET | Freq: Every evening | ORAL | Status: DC | PRN
  Administered 2017-02-23 – 2017-02-25 (×3): 5 mg via ORAL
  Filled 2017-02-23 (×3): qty 1

## 2017-02-23 MED ORDER — ALPRAZOLAM 0.25 MG PO TABS
0.2500 mg | ORAL_TABLET | Freq: Two times a day (BID) | ORAL | Status: DC | PRN
  Administered 2017-02-23 – 2017-02-26 (×2): 0.25 mg via ORAL
  Filled 2017-02-23 (×2): qty 1

## 2017-02-23 MED ORDER — ACETAMINOPHEN 160 MG/5ML PO SOLN: 650.0000 mg | ORAL | Status: DC | PRN

## 2017-02-23 MED ORDER — ACETAMINOPHEN 325 MG PO TABS: 650.0000 mg | ORAL_TABLET | ORAL | Status: DC | PRN

## 2017-02-23 MED ORDER — LABETALOL HCL 200 MG PO TABS
200.0000 mg | ORAL_TABLET | Freq: Two times a day (BID) | ORAL | Status: DC
  Administered 2017-02-23 – 2017-02-26 (×5): 200 mg via ORAL
  Filled 2017-02-23 (×6): qty 1

## 2017-02-23 MED ORDER — CINACALCET HCL 30 MG PO TABS
30.0000 mg | ORAL_TABLET | ORAL | Status: DC
  Filled 2017-02-23: qty 1

## 2017-02-23 MED ORDER — ACETAMINOPHEN 650 MG RE SUPP: 650.0000 mg | RECTAL | Status: DC | PRN

## 2017-02-23 MED ORDER — POTASSIUM CHLORIDE CRYS ER 20 MEQ PO TBCR
20.0000 meq | EXTENDED_RELEASE_TABLET | ORAL | Status: AC
  Filled 2017-02-23 (×2): qty 1

## 2017-02-23 MED ORDER — ALLOPURINOL 100 MG PO TABS
150.0000 mg | ORAL_TABLET | Freq: Every day | ORAL | Status: DC
  Administered 2017-02-24 – 2017-02-25 (×3): 150 mg via ORAL
  Filled 2017-02-23: qty 1
  Filled 2017-02-23: qty 2
  Filled 2017-02-23 (×2): qty 1

## 2017-02-23 MED ORDER — DOXAZOSIN MESYLATE 8 MG PO TABS
8.0000 mg | ORAL_TABLET | Freq: Every day | ORAL | Status: DC
  Administered 2017-02-23 – 2017-02-25 (×3): 8 mg via ORAL
  Filled 2017-02-23 (×3): qty 1

## 2017-02-23 MED ORDER — LOSARTAN POTASSIUM 50 MG PO TABS
100.0000 mg | ORAL_TABLET | Freq: Every day | ORAL | Status: DC
  Administered 2017-02-23 – 2017-02-25 (×3): 100 mg via ORAL
  Filled 2017-02-23 (×3): qty 2

## 2017-02-23 MED ORDER — FERRIC CITRATE 1 GM 210 MG(FE) PO TABS
420.0000 mg | ORAL_TABLET | Freq: Three times a day (TID) | ORAL | Status: DC
  Administered 2017-02-24 – 2017-02-26 (×6): 420 mg via ORAL
  Filled 2017-02-23 (×9): qty 2

## 2017-02-23 MED ORDER — SIMVASTATIN 10 MG PO TABS
10.0000 mg | ORAL_TABLET | Freq: Every day | ORAL | Status: DC
  Administered 2017-02-23: 10 mg via ORAL
  Filled 2017-02-23 (×2): qty 1

## 2017-02-23 MED ORDER — STROKE: EARLY STAGES OF RECOVERY BOOK
Freq: Once | Status: AC
  Administered 2017-02-24
  Filled 2017-02-23: qty 1

## 2017-02-23 MED ORDER — MECLIZINE HCL 25 MG PO TABS: 25.0000 mg | ORAL_TABLET | Freq: Two times a day (BID) | ORAL | Status: DC | PRN

## 2017-02-23 MED ORDER — ASPIRIN EC 325 MG PO TBEC: 325.0000 mg | DELAYED_RELEASE_TABLET | Freq: Every day | ORAL | Status: DC

## 2017-02-23 MED ORDER — RENA-VITE PO TABS
1.0000 | ORAL_TABLET | Freq: Every day | ORAL | Status: DC
  Administered 2017-02-24 – 2017-02-26 (×2): 1 via ORAL
  Filled 2017-02-23 (×3): qty 1

## 2017-02-23 MED ORDER — SENNOSIDES-DOCUSATE SODIUM 8.6-50 MG PO TABS: 1.0000 | ORAL_TABLET | Freq: Every evening | ORAL | Status: DC | PRN

## 2017-02-23 NOTE — Code Documentation (Signed)
66 y.o. 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 Pt receives dialysis MWF. She missed her Monday treatment d/t weather. Today she was at dialysis being treated for her missed Monday appt. At the end of her treatment the staff noted the patient to be acting abnormally. "Not acting her usual self," had slurred speech, and was "fuzzy headed." The staff called her sister who was to pick her up. The staff suggested she take the pt to the ED. The sister picked her up and brought her into the ED. Upon arrival a code stroke was called. Pt taken CT. CT unremarkable. ASPECTS 10. NIHSS 0. Pt w/ generalized weakness. No focal deficits assessed. tPA not given d/t stroke not suspected. Code stroke canceled per neurologist. Bedside handoff w/ ED RN Raquel Sarna

## 2017-02-23 NOTE — Consult Note (Addendum)
Requesting Physician: Dr. Kathrynn Humble    Chief Complaint:  Feeling altered  History obtained from:  Patient    HPI:                                                                                                                                         [name] is an 66 y.o. female who is a dialysis today when patient's sister went to pick her up and noted that she was not acting her usual self. Patient was stated to go to the ED. Patient on arrival stated she was fuzzy headed. She's had TIAs in the past. When asked how she feels she states that she just does not feel, she feels fuzzy headed, she is very lethargic and specifically denies having focal symptoms. She was was to have dialysis yesterday however she misses secondary to the snow. While in CT, patient no localizing or lateralizing symptoms. The patient answered all questions appropriately. Patient does continue to say that she just did not feel right and head." Was canceled.  Date last known well: Date: 02/23/2017 Time last known well: Time: 11:00 tPA Given: No: no localizing symptoms  Past Medical History:  Diagnosis Date  . Adenomatous polyp of colon 10/2010, 2006, 2015  . Anemia in CKD (chronic kidney disease) 11/07/2012   s/p blood transfusion.   . Arthritis   . CHF (congestive heart failure) ([place])   . Constipation   . Depression with anxiety   . Diverticula, colon   . 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  Had deceased donor kidney transplant in 1996.  Had some early rejection then stable function for years, then had slow decline of function and went back on hemodialysis in 2012.  Gets HD TTS schedule at [facility] still using L forearm AVF.     Marland Kitchen GERD (gastroesophageal reflux disease)   . Headache   . Hyperlipidemia   . Hypertension   . Neuromuscular disorder (HCC)    neuropathy hand and legs  . Osteoporosis   .  Pneumonia   . 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 Stroke [facility]) 11/2015   TIA  . Weight loss, unintentional     Past Surgical History:  Procedure Laterality Date  . AV FISTULA PLACEMENT     for dialysis  . AV FISTULA PLACEMENT Left 11/22/2015   Procedure: ARTERIOVENOUS (AV) FISTULA CREATION-LEFT BRACHIOCEPHALIC;  Surgeon: Serafina Mitchell, MD;  Location: [place];  Service: Vascular;  Laterality: Left;  . BACK SURGERY    . CERVICAL FUSION    . CHOLECYSTECTOMY  12/02/2012   Procedure: LAPAROSCOPIC CHOLECYSTECTOMY WITH INTRAOPERATIVE CHOLANGIOGRAM;  Surgeon: Edward Jolly, MD;  Location: [place];  Service: General;  Laterality: N/A;  . EYE SURGERY Bilateral  cataract surgery  . HEMATOMA EVACUATION Left 12/24/2016   Procedure: EVACUATION HEMATOMA LEFT UPPER ARM;  Surgeon: Waynetta Sandy, MD;  Location: [place];  Service: Vascular;  Laterality: Left;  . I&D EXTREMITY Left 12/31/2016   Procedure: IRRIGATION AND DEBRIDEMENT EXTREMITY;  Surgeon: Angelia Mould, MD;  Location: [place];  Service: Vascular;  Laterality: Left;  . INSERTION OF DIALYSIS CATHETER Right 12/24/2016   Procedure: INSERTION OF DIALYSIS CATHETER;  Surgeon: Waynetta Sandy, MD;  Location: [place];  Service: Vascular;  Laterality: Right;  . INSERTION OF DIALYSIS CATHETER Right 02/04/2017   Procedure: INSERTION OF DIALYSIS CATHETER;  Surgeon: Waynetta Sandy, MD;  Location: [place];  Service: Vascular;  Laterality: Right;  . KIDNEY TRANSPLANT  1996  . PERIPHERAL VASCULAR CATHETERIZATION Left 10/23/2016   Procedure: Fistulagram;  Surgeon: Elam Dutch, MD;  Location: [place] [facility];  Service: Cardiovascular;  Laterality: Left;  . RESECTION OF ARTERIOVENOUS FISTULA ANEURYSM Left 11/22/2015   Procedure: RESECTION OF LEFT RADIOCEPHALIC FISTULA ANEURYSM ;  Surgeon: Serafina Mitchell, MD;  Location: [place];  Service: Vascular;  Laterality: Left;  . REVISON OF ARTERIOVENOUS  FISTULA Left 12/22/2016   Procedure: REVISON OF LEFT ARTERIOVENOUS FISTULA;  Surgeon: Waynetta Sandy, MD;  Location: [place];  Service: Vascular;  Laterality: Left;  . REVISON OF ARTERIOVENOUS FISTULA Left 02/04/2017   Procedure: REVISON OF LEFT UPPER ARM ARTERIOVENOUS FISTULA;  Surgeon: Waynetta Sandy, MD;  Location: [facility];  Service: Vascular;  Laterality: Left;    Family History  Problem Relation Age of Onset  . Colon cancer Brother   . Cancer Brother   . Coronary artery disease Mother 38  . Hyperlipidemia Mother   . Hypertension Mother   . Esophageal cancer Neg Hx   . Stomach cancer Neg Hx   . Rectal cancer Neg Hx    Social History:  reports that she quit smoking about 25 years ago. Her smoking use included Cigarettes. She has never used smokeless tobacco. She reports that she does not drink alcohol or use drugs.  Allergies:  Allergies  Allergen Reactions  . Sulfa Antibiotics Other (See Comments)    Both parents allergic-so will not take  . Adhesive [Tape] Itching    Medications:                                                                                                                           No current facility-administered medications for this encounter.    Current Outpatient Prescriptions  Medication Sig Dispense Refill  . acetaminophen (TYLENOL) 325 MG tablet Take 650 mg by mouth every 6 (six) hours as needed for mild pain.    Marland Kitchen acetaminophen-codeine (TYLENOL #3) 300-30 MG tablet Take 1-2 tablets by mouth every 6 (six) hours as needed for moderate pain. 15 tablet 0  . allopurinol (ZYLOPRIM) 300 MG tablet Take 150 mg by mouth at bedtime.    . ALPRAZolam (XANAX) 0.25 MG tablet Take  0.25 mg by mouth 2 (two) times daily as needed for anxiety.     Marland Kitchen aspirin EC 325 MG tablet Take 325 mg by mouth at bedtime.    Lorin Picket 1 GM 210 MG(Fe) tablet Take 420 mg by mouth 3 (three) times daily. With meals    . B Complex-C-Folic Acid (DIALYVITE PO) Take 1 tablet by  mouth daily.    Marland Kitchen doxazosin (CARDURA) 8 MG tablet Take 1 tablet (8 mg total) by mouth at bedtime.    Marland Kitchen labetalol (NORMODYNE) 200 MG tablet Take 200 mg by mouth 2 (two) times daily.     Marland Kitchen lactulose (CHRONULAC) 10 GM/15ML solution Take 20 g by mouth daily as needed for mild constipation.    Marland Kitchen losartan (COZAAR) 100 MG tablet Take 100 mg by mouth at bedtime.     . meclizine (ANTIVERT) 25 MG tablet Take 1 tablet (25 mg total) by mouth 2 (two) times daily as needed for dizziness. 30 tablet 0  . promethazine (PHENERGAN) 25 MG tablet Take 25 mg by mouth every 6 (six) hours as needed for nausea or vomiting.    . SENSIPAR 30 MG tablet Take 30 mg by mouth at bedtime.    . simvastatin (ZOCOR) 10 MG tablet Take 10 mg by mouth at bedtime.     Marland Kitchen zolpidem (AMBIEN) 10 MG tablet Take 10 mg by mouth at bedtime as needed for sleep.        ROS:                                                                                                                                       History obtained from the patient  General ROS: negative for - chills, fatigue, fever, night sweats, weight gain or weight loss Psychological ROS: negative for - behavioral disorder, hallucinations, memory difficulties, mood swings or suicidal ideation Ophthalmic ROS: negative for - blurry vision, double vision, eye pain or loss of vision ENT ROS: negative for - epistaxis, nasal discharge, oral lesions, sore throat, tinnitus or vertigo Allergy and Immunology ROS: negative for - hives or itchy/watery eyes Hematological and Lymphatic ROS: negative for - bleeding problems, bruising or swollen lymph nodes Endocrine ROS: negative for - galactorrhea, hair pattern changes, polydipsia/polyuria or temperature intolerance Respiratory ROS: negative for - cough, hemoptysis, shortness of breath or wheezing Cardiovascular ROS: negative for - chest pain, dyspnea on exertion, edema or irregular heartbeat Gastrointestinal ROS: negative for - abdominal pain,  diarrhea, hematemesis, nausea/vomiting or stool incontinence Genito-Urinary ROS: negative for - dysuria, hematuria, incontinence or urinary frequency/urgency Musculoskeletal ROS: negative for - joint swelling or muscular weakness Neurological ROS: as noted in HPI Dermatological ROS: negative for rash and skin lesion changes  Neurologic Examination:  Blood pressure 135/85, pulse 79, resp. rate 18, weight 121 lb 7.6 oz (55.1 kg), SpO2 100 %.  HEENT-  Normocephalic, no lesions, without obvious abnormality.  Normal external eye and conjunctiva.  Normal TM's bilaterally.  Normal auditory canals and external ears. Normal external nose, mucus membranes and septum.  Normal pharynx. Cardiovascular- S1, S2 normal, pulses palpable throughout   Lungs- chest clear, no wheezing, rales, normal symmetric air entry, Heart exam - S1, S2 normal, no murmur, no gallop, rate regular Abdomen- normal findings: bowel sounds normal Extremities- no edema Lymph-no adenopathy palpable Musculoskeletal-no joint tenderness, deformity or swelling Skin-warm and dry, no hyperpigmentation, vitiligo, or suspicious lesions  Neurological Examination Mental Status: Alert, oriented, thought content appropriate.  Speech fluent without evidence of aphasia.  Able to follow 3 step commands without difficulty. Cranial Nerves: II: Discs flat bilaterally; Visual fields grossly normal,  III,IV, VI: ptosis not present, extra-ocular motions intact bilaterally, pupils equal, round, reactive to light and accommodation V,VII: smile symmetric--while asking patient to smile and was notable that patient was at times lifting the left side of her face than the right side than the left side. It was very inconsistent, facial light touch sensation normal bilaterally VIII: hearing normal bilaterally IX,X: uvula rises symmetrically XI: bilateral shoulder  shrug XII: midline tongue extension Motor: Right : Upper extremity   5/5    Left:     Upper extremity   5/5  Lower extremity   5/5     Lower extremity   5/5 Tone and bulk:normal tone throughout; no atrophy noted Sensory: Pinprick and light touch intact throughout, bilaterally Deep Tendon Reflexes: 1+ and symmetric throughout Plantars: Right: downgoing   Left: downgoing Cerebellar: normal finger-to-nose, normal rapid alternating movements and normal heel-to-shin test Gait: Tested secondary to safety       Lab Results: Basic Metabolic Panel: No results for input(s): NA, K, CL, CO2, GLUCOSE, BUN, CREATININE, CALCIUM, MG, PHOS in the last 168 hours.  Liver Function Tests: No results for input(s): AST, ALT, ALKPHOS, BILITOT, PROT, ALBUMIN in the last 168 hours. No results for input(s): LIPASE, AMYLASE in the last 168 hours. No results for input(s): AMMONIA in the last 168 hours.  CBC: No results for input(s): WBC, NEUTROABS, HGB, HCT, MCV, PLT in the last 168 hours.  Cardiac Enzymes: No results for input(s): CKTOTAL, CKMB, CKMBINDEX, TROPONINI in the last 168 hours.  Lipid Panel: No results for input(s): CHOL, TRIG, HDL, CHOLHDL, VLDL, LDLCALC in the last 168 hours.  CBG: No results for input(s): GLUCAP in the last 168 hours.  Microbiology: Results for orders placed or performed during the hospital encounter of 12/21/16  MRSA PCR Screening     Status: None   Collection Time: 12/21/16 11:22 PM  Result Value Ref Range Status   MRSA by PCR NEGATIVE NEGATIVE Final    Comment:        The GeneXpert MRSA Assay (FDA approved for NASAL specimens only), is one component of a comprehensive MRSA colonization surveillance program. It is not intended to diagnose MRSA infection nor to guide or monitor treatment for MRSA infections.   Surgical PCR screen     Status: None   Collection Time: 12/23/16 10:31 PM  Result Value Ref Range Status   MRSA, PCR NEGATIVE NEGATIVE Final    Staphylococcus aureus NEGATIVE NEGATIVE Final    Comment:        The Xpert SA Assay (FDA approved for NASAL specimens in patients over 79 years of age), is one component  of a comprehensive surveillance program.  Test performance has been validated by [facility] for patients greater than or equal to 18 year old. It is not intended to diagnose infection nor to guide or monitor treatment.     Coagulation Studies: No results for input(s): LABPROT, INR in the last 72 hours.  Imaging: Ct Head Code Stroke W/o Cm  Result Date: 02/23/2017 CLINICAL DATA:  Code stroke. 66 year old female with dizziness lethargy, generalized weakness, altered mental status. EXAM: CT HEAD WITHOUT CONTRAST TECHNIQUE: Contiguous axial images were obtained from the base of the skull through the vertex without intravenous contrast. COMPARISON:  Brain MRI 06/24/2016.  Head CT 06/11/2016, and earlier. FINDINGS: Brain: No acute intracranial hemorrhage identified. No midline shift, mass effect, or evidence of intracranial mass lesion. No ventriculomegaly. Chronic moderate to severe heterogeneity in the cerebral white matter, deep gray matter nuclei, and brainstem compatible with advanced chronic small vessel disease. No superimposed acute cortically based infarct identified. Vascular: Calcified atherosclerosis at the skull base. No suspicious intracranial vascular hyperdensity. Skull: No acute osseous abnormality identified. Chronic moderate to severe degenerative changes including suspected ligamentous hypertrophy about the odontoid. Associated widening of the atlanto dens interval. Sinuses/Orbits: Visualized paranasal sinuses and mastoids are stable and well pneumatized. Other: Previous left forehead scalp hematoma. No acute orbit or scalp soft tissue findings. Mild calcified scalp vessel atherosclerosis. ASPECTS [facility] Stroke Program Early CT Score) - Ganglionic level infarction (caudate, lentiform nuclei, internal capsule,  insula, M1-M3 cortex): 7 - Supraganglionic infarction (M4-M6 cortex): 3 Total score (0-10 with 10 being normal): 10 IMPRESSION: 1. No acute hemorrhage or acute cortically based infarct identified. Advanced small vessel disease. 2. ASPECTS is 10. 3. The above was relayed via text pager to Dr. Spero Geralds On 02/23/2017 at 15:41 . Electronically Signed   By: Genevie Ann M.D.   On: 02/23/2017 15:42       Assessment and plan discussed with with attending physician and they are in agreement.    Etta Quill PA-C [facility] [phone]  02/23/2017, 3:46 PM   Assessment: 66 y.o. female resenting to the emergency room after being picked up from dialysis and noting that she was not acting herself. While in triage patient continued to complain of not acting herself this code stroke was called. On exam patient is alert and oriented follows all commands shows no localizing or lateralizing symptoms that would concern 1 for stroke. This very well could be secondary to abnormal electrolytes, blood pressure, uremia, and cannot rule out psychogenic.  Recommend: 1) MRI of brain if negative no further recommendations for stroke workup. 2) CMP, CBC, ammonia, TSH, magnesium, phosphorus, 3) PT/OT 5) hydration if patient appears dehydrated with labs. 6) urine drug screen.  Stroke Risk Factors - hyperlipidemia and hypertension   Addendum: I reviewed the above note.  She presented as an acute stroke alert but when questioned why she was here she says it is because she became "fuzzy headed" during dyalysis.  Denied focal sx.  Exam was non focal and her NIHSS = 0.  Code stroke was cancelled. Not a tPA candidate due to sx being inconsistent with stroke.  Some light workup was recommended.  If the MRI is negative we will be signing off, otherwise patient will be followed by the stroke team.

## 2017-02-23 NOTE — ED Notes (Signed)
MD at bedside. 

## 2017-02-23 NOTE — ED Notes (Signed)
Admitting MD at bedside.

## 2017-02-23 NOTE — ED Notes (Signed)
CBG 111 

## 2017-02-23 NOTE — ED Triage Notes (Signed)
Pt recommended to come to ED from dialysis center for further evaluation of AMS - when patient's sister went to pick her up, dialysis RN stated pt was "not acting her usual self," had slurred speech, and was "fuzzy headed." Hx TIAs. Patient appears to be lethargic but states she is "feeling very tired." MWF dialysis, but missed yesterday d/t the snow. A&O x 4. Pt also reports dizziness, but states it feels like her vertigo, which has been going on for a week. Sister adds that when pt was calling her this morning, she was acting weird then too (around 11am).

## 2017-02-23 NOTE — H&P (Addendum)
History and Physical    [name] NUU:[id number] DOB: [birth year] DOA: 02/23/2017  Referring MD/NP/PA: Dr. Georgia Lopes PCP: Kandice Hams, MD  Patient coming from: Hemodialysis  Chief Complaint: Altered  HPI: [name] is a 66 y.o. 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 History is obtained from the patient and her sister who is present at bedside. It appears that the patient actually said issues since before her recent revision of her left AV fistula on 2/22. Patient notes that she just felt funny/fuzzy headed over the last few weeks, but was acutely worse today. Her sister notes that when she called the patient around 45 AM this morning she had not her usual self. Reports having many social stressors in her life and difficulty with keeping up with her bills. Patient notes that she often misplacing bills or forgetting to pay them on time. Associated symptoms include difficulty with balance, unsteady gait, right-sided headaches, fatigue, and intermittent palpitations. In the last few weeks she has fallen after tripped over the dog and reports running into things after losing her balance in her home. Patient reports that she was dialyzed today, but came off 1 hour early and she was unable to make her appointment on Monday due to the inclement weather. She does not make any urine. Status post facility noted that the patient was not her usual self and called for her sister to pick her up. Denies any focal weakness, loss of consciousness, shortness of breath, abdominal pain, nausea, vomiting, or cough.      ED Course: Upon admission into the emergency department patient was seen to be afebrile, blood pressure elevated up to 180/94, and all other vitals within normal limits. Lab work revealed 1111.2, potassium 3.4, BUN 36, creatinine 5.18, glucose  140. CT scan of the brain showed no acute abnormalities. Neurology was consulted and recommended MRI of the brain which revealed acute on chronic white matter infarct on the body of the right corpus callosum.   Review of Systems: As per HPI otherwise 10 point review of systems negative.   Past Medical History:  Diagnosis Date  . Adenomatous polyp of colon 10/2010, 2006, 2015  . Anemia in CKD (chronic kidney disease) 11/07/2012   s/p blood transfusion.   . Arthritis   . CHF (congestive heart failure) ([place])   . Constipation   . Depression with anxiety   . Diverticula, colon   . 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  Had deceased donor kidney transplant in 1996.  Had some early rejection then stable function for years, then had slow decline of function and went back on hemodialysis in 2012.  Gets HD TTS schedule at [facility] still using L forearm AVF.     Marland Kitchen GERD (gastroesophageal reflux disease)   . Headache   . Hyperlipidemia   . Hypertension   . Neuromuscular disorder (HCC)    neuropathy hand and legs  . Osteoporosis   . Pneumonia   . 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 Stroke [facility]) 11/2015   TIA  . Weight loss, unintentional     Past Surgical History:  Procedure Laterality Date  . AV FISTULA PLACEMENT     for dialysis  . AV FISTULA PLACEMENT  Left 11/22/2015   Procedure: ARTERIOVENOUS (AV) FISTULA CREATION-LEFT BRACHIOCEPHALIC;  Surgeon: Serafina Mitchell, MD;  Location: [place];  Service: Vascular;  Laterality: Left;  . BACK SURGERY    . CERVICAL FUSION    . CHOLECYSTECTOMY  12/02/2012   Procedure: LAPAROSCOPIC CHOLECYSTECTOMY WITH INTRAOPERATIVE CHOLANGIOGRAM;  Surgeon: Edward Jolly, MD;  Location: [place];  Service: General;  Laterality: N/A;  . EYE SURGERY Bilateral    cataract surgery  . HEMATOMA EVACUATION Left 12/24/2016   Procedure:  EVACUATION HEMATOMA LEFT UPPER ARM;  Surgeon: Waynetta Sandy, MD;  Location: [place];  Service: Vascular;  Laterality: Left;  . I&D EXTREMITY Left 12/31/2016   Procedure: IRRIGATION AND DEBRIDEMENT EXTREMITY;  Surgeon: Angelia Mould, MD;  Location: [place];  Service: Vascular;  Laterality: Left;  . INSERTION OF DIALYSIS CATHETER Right 12/24/2016   Procedure: INSERTION OF DIALYSIS CATHETER;  Surgeon: Waynetta Sandy, MD;  Location: [place];  Service: Vascular;  Laterality: Right;  . INSERTION OF DIALYSIS CATHETER Right 02/04/2017   Procedure: INSERTION OF DIALYSIS CATHETER;  Surgeon: Waynetta Sandy, MD;  Location: [place];  Service: Vascular;  Laterality: Right;  . KIDNEY TRANSPLANT  1996  . PERIPHERAL VASCULAR CATHETERIZATION Left 10/23/2016   Procedure: Fistulagram;  Surgeon: Elam Dutch, MD;  Location: [place] CV LAB;  Service: Cardiovascular;  Laterality: Left;  . RESECTION OF ARTERIOVENOUS FISTULA ANEURYSM Left 11/22/2015   Procedure: RESECTION OF LEFT RADIOCEPHALIC FISTULA ANEURYSM ;  Surgeon: Serafina Mitchell, MD;  Location: [place];  Service: Vascular;  Laterality: Left;  . REVISON OF ARTERIOVENOUS FISTULA Left 12/22/2016   Procedure: REVISON OF LEFT ARTERIOVENOUS FISTULA;  Surgeon: Waynetta Sandy, MD;  Location: [place];  Service: Vascular;  Laterality: Left;  . REVISON OF ARTERIOVENOUS FISTULA Left 02/04/2017   Procedure: REVISON OF LEFT UPPER ARM ARTERIOVENOUS FISTULA;  Surgeon: Waynetta Sandy, MD;  Location: [place];  Service: Vascular;  Laterality: Left;     reports that she quit smoking about 25 years ago. Her smoking use included Cigarettes. She has never used smokeless tobacco. She reports that she does not drink alcohol or use drugs.  Allergies  Allergen Reactions  . Sulfa Antibiotics Other (See Comments)    Both parents allergic-so will not take  . Adhesive [Tape] Itching    Family History  Problem Relation Age of Onset  .  Colon cancer Brother   . Cancer Brother   . Coronary artery disease Mother 55  . Hyperlipidemia Mother   . Hypertension Mother   . Esophageal cancer Neg Hx   . Stomach cancer Neg Hx   . Rectal cancer Neg Hx     Prior to Admission medications   Medication Sig Start Date End Date Taking? Authorizing Provider  acetaminophen (TYLENOL) 325 MG tablet Take 650 mg by mouth every 6 (six) hours as needed for mild pain or headache.    Yes Historical Provider, MD  allopurinol (ZYLOPRIM) 300 MG tablet Take 150 mg by mouth at bedtime.   Yes Historical Provider, MD  ALPRAZolam (XANAX) 0.25 MG tablet Take 0.25 mg by mouth 2 (two) times daily as needed for anxiety.    Yes Historical Provider, MD  aspirin EC 325 MG tablet Take 325 mg by mouth at bedtime.   Yes Historical Provider, MD  AURYXIA 1 GM 210 MG(Fe) tablet Take 420 mg by mouth 3 (three) times daily with meals.  12/22/16  Yes Historical Provider, MD  B Complex-C-Folic Acid (DIALYVITE PO) Take  1 tablet by mouth daily.   Yes Historical Provider, MD  doxazosin (CARDURA) 8 MG tablet Take 1 tablet (8 mg total) by mouth at bedtime. 12/26/16  Yes Alvia Grove, PA-C  labetalol (NORMODYNE) 200 MG tablet Take 200 mg by mouth 2 (two) times daily.    Yes Historical Provider, MD  lactulose (CHRONULAC) 10 GM/15ML solution Take 20 g by mouth daily as needed for mild constipation.   Yes Historical Provider, MD  losartan (COZAAR) 100 MG tablet Take 100 mg by mouth at bedtime.    Yes Historical Provider, MD  meclizine (ANTIVERT) 25 MG tablet Take 1 tablet (25 mg total) by mouth 2 (two) times daily as needed for dizziness. 06/24/16  Yes Merryl Hacker, MD  promethazine (PHENERGAN) 25 MG tablet Take 25 mg by mouth every 6 (six) hours as needed for nausea or vomiting.   Yes Historical Provider, MD  SENSIPAR 30 MG tablet Take 30 mg by mouth every Monday, Wednesday, and Friday with hemodialysis.  08/20/16  Yes Historical Provider, MD  simvastatin (ZOCOR) 10 MG tablet Take  10 mg by mouth at bedtime.    Yes Historical Provider, MD  zolpidem (AMBIEN) 10 MG tablet Take 10 mg by mouth at bedtime as needed for sleep.    Yes Historical Provider, MD  acetaminophen-codeine (TYLENOL #3) 300-30 MG tablet Take 1-2 tablets by mouth every 6 (six) hours as needed for moderate pain. Patient not taking: Reported on 02/23/2017 02/04/17   Alvia Grove, PA-C    Physical Exam:  Constitutional: Elderly female who appears to be in moderate distress discussing keeping up with bills. Vitals:   02/23/17 1745 02/23/17 1930 02/23/17 2000 02/23/17 2030  BP: 163/84 176/96 180/94 153/85  Pulse: 76 77 78 70  Resp: 16     Temp:      TempSrc:      SpO2: 95% 100% 95% 100%  Weight:       Eyes: PERRL, lids and conjunctivae normal ENMT: Mucous membranes are moist. Posterior pharynx clear of any exudate or lesions.Neck: normal, supple, no masses, no thyromegaly Respiratory: clear to auscultation bilaterally, no wheezing, no crackles. Normal respiratory effort. No accessory muscle use.  Cardiovascular: Regular rate and rhythm, no murmurs / rubs / gallops. No extremity edema. 2+ pedal pulses. No carotid bruits.Right chest wall port present. Left upper extremity fistula appears to be healing well.  Abdomen: no tenderness, no masses palpated. No hepatosplenomegaly. Bowel sounds positive.  Musculoskeletal: no clubbing / cyanosis. No joint deformity upper and lower extremities. Good ROM, no contractures. Normal muscle tone.  Skin: no rashes, lesions, ulcers. No induration Neurologic: CN 2-12 grossly intact. Sensation intact, DTR normal. Strength 5/5 in all 4.  Psychiatric: Normal judgment and insight. Alert and oriented x 2. Anxious mood.     Labs on Admission: I have personally reviewed following labs and imaging studies  CBC:  Recent Labs Lab 02/23/17 1538 02/23/17 1542  WBC 5.2  --   NEUTROABS 3.1  --   HGB 11.2* 12.6  HCT 35.7* 37.0  MCV 91.3  --   PLT 177  --    Basic  Metabolic Panel:  Recent Labs Lab 02/23/17 1538 02/23/17 1542  NA 139 137  K 3.4* 3.4*  CL 93* 94*  CO2 31  --   GLUCOSE 140* 137*  BUN 36* 37*  CREATININE 5.18* 5.30*  CALCIUM 8.7*  --    GFR: Estimated Creatinine Clearance: 8.3 mL/min (by C-G formula based on SCr of 5.3  mg/dL (H)). Liver Function Tests:  Recent Labs Lab 02/23/17 1538  AST 26  ALT 11*  ALKPHOS 87  BILITOT 0.8  PROT 7.3  ALBUMIN 3.4*   No results for input(s): LIPASE, AMYLASE in the last 168 hours. No results for input(s): AMMONIA in the last 168 hours. Coagulation Profile:  Recent Labs Lab 02/23/17 1538  INR 1.10   Cardiac Enzymes: No results for input(s): CKTOTAL, CKMB, CKMBINDEX, TROPONINI in the last 168 hours. BNP (last 3 results) No results for input(s): PROBNP in the last 8760 hours. HbA1C: No results for input(s): HGBA1C in the last 72 hours. CBG:  Recent Labs Lab 02/23/17 1554  GLUCAP 111*   Lipid Profile: No results for input(s): CHOL, HDL, LDLCALC, TRIG, CHOLHDL, LDLDIRECT in the last 72 hours. Thyroid Function Tests: No results for input(s): TSH, T4TOTAL, FREET4, T3FREE, THYROIDAB in the last 72 hours. Anemia Panel: No results for input(s): VITAMINB12, FOLATE, FERRITIN, TIBC, IRON, RETICCTPCT in the last 72 hours. Urine analysis:    Component Value Date/Time   COLORURINE YELLOW 01/30/2011 1648   APPEARANCEUR CLOUDY (A) 01/30/2011 1648   LABSPEC 1.016 01/30/2011 1648   PHURINE 5.0 01/30/2011 1648   GLUCOSEU NEGATIVE 10/14/2009 0747   HGBUR SMALL (A) 01/30/2011 1648   BILIRUBINUR NEGATIVE 01/30/2011 1648   KETONESUR NEGATIVE 01/30/2011 1648   PROTEINUR 100 (A) 01/30/2011 1648   UROBILINOGEN 0.2 01/30/2011 1648   NITRITE NEGATIVE 01/30/2011 1648   LEUKOCYTESUR SMALL (A) 01/30/2011 1648   Sepsis Labs: No results found for this or any previous visit (from the past 240 hour(s)).   Radiological Exams on Admission: Mr Brain Wo Contrast  Result Date:  02/23/2017 CLINICAL DATA:  66 year old female with confusion, generalize weakness, recently missed hemodialysis. Initial encounter. EXAM: MRI HEAD WITHOUT CONTRAST TECHNIQUE: Multiplanar, multiecho pulse sequences of the brain and surrounding structures were obtained without intravenous contrast. COMPARISON:  Head CT 1531 hours today. Brain MRI 06/24/2016 and earlier. FINDINGS: Brain: There is a small 9 mm focus of restricted diffusion along the right body of the corpus callosum best seen on series 3, image 30. There is subtle associated T2 and FLAIR hyperintensity (series 12, image 14) with no significant mass effect. Punctate petechial hemorrhaged possible (series 9, image 62). No other restricted diffusion. Advanced chronic small vessel disease with numerous chronic micro hemorrhages (maximal in the deep gray matter, brainstem, and deep gray matter nuclei), and multiple chronic lacunar infarcts in the deep gray matter nuclei and brainstem - including a prominent central pontine lacune which is chronic but new since July 2017 (series 6, image 8). Associated chronic bilateral cerebral white matter T2 and FLAIR hyperintensity, including deep white matter capsule involvement, has not significantly changed. No cortical encephalomalacia identified. No midline shift, mass effect, evidence of mass lesion, ventriculomegaly, or extra-axial collection. Cervicomedullary junction and pituitary are within normal limits. Vascular: Major intracranial vascular flow voids appear stable since 2017 including dominant and dolichoectatic distal right vertebral artery. Skull and upper cervical spine: Stable. Sinuses/Orbits: Stable and negative. Other: Mastoid air cells are clear. Visible internal auditory structures appear normal. Negative scalp soft tissues. IMPRESSION: 1. 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 Petechial hemorrhage but no associated mass effect. 2. No other acute  intracranial abnormality. Extensive chronic small vessel disease with some progression elsewhere since July 2017. Electronically Signed   By: Genevie Ann M.D.   On: 02/23/2017 18:55   Ct Head Code Stroke W/o Cm  Result Date: 02/23/2017  CLINICAL DATA:  Code stroke. 66 year old female with dizziness lethargy, generalized weakness, altered mental status. EXAM: CT HEAD WITHOUT CONTRAST TECHNIQUE: Contiguous axial images were obtained from the base of the skull through the vertex without intravenous contrast. COMPARISON:  Brain MRI 06/24/2016.  Head CT 06/11/2016, and earlier. FINDINGS: Brain: No acute intracranial hemorrhage identified. No midline shift, mass effect, or evidence of intracranial mass lesion. No ventriculomegaly. Chronic moderate to severe heterogeneity in the cerebral white matter, deep gray matter nuclei, and brainstem compatible with advanced chronic small vessel disease. No superimposed acute cortically based infarct identified. Vascular: Calcified atherosclerosis at the skull base. No suspicious intracranial vascular hyperdensity. Skull: No acute osseous abnormality identified. Chronic moderate to severe degenerative changes including suspected ligamentous hypertrophy about the odontoid. Associated widening of the atlanto dens interval. Sinuses/Orbits: Visualized paranasal sinuses and mastoids are stable and well pneumatized. Other: Previous left forehead scalp hematoma. No acute orbit or scalp soft tissue findings. Mild calcified scalp vessel atherosclerosis. ASPECTS [facility] Stroke Program Early CT Score) - Ganglionic level infarction (caudate, lentiform nuclei, internal capsule, insula, M1-M3 cortex): 7 - Supraganglionic infarction (M4-M6 cortex): 3 Total score (0-10 with 10 being normal): 10 IMPRESSION: 1. No acute hemorrhage or acute cortically based infarct identified. Advanced small vessel disease. 2. ASPECTS is 10. 3. The above was relayed via text pager to Dr. Spero Geralds On 02/23/2017 at 15:41 .  Electronically Signed   By: Genevie Ann M.D.   On: 02/23/2017 15:42    EKG: Independently reviewed. Normal sinus rhythm with prolonged QTC of 548  Assessment/Plan CVA with petechial hemorrhages: Acute on chronic. Patient reported being acutely altered at HD. Patient found to have acute on chronic CVA on MRI of the body of the right corpus callosum with petechial hemorrhages. No significant mass effect noted. - Admit to telemetry bed - Stroke order set initiated - Neuro checks - Check Hemoglobin A1c and lipid panel in a.m. - Check Vas carotid U/S - Check echocardiogram - PT/OT to eval and treat - ASA  - Appreciate neurology consultative services, will follow-up for further recommendations - Social work consult   Acute encephalopathy: Question if secondary to above.  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 - continue Sensipar and ferric citrate - Left message on hemodialysis as well regarding patient's admission into the hospital  Essential hypertension - Continue Cozaar, Cardura, and labetalol  Prolonged QTc: Acute on chronic. QTC on admission 548 previously noted to be 507 back in 06/2006. - Try to correct any electrolyte abnormalities - Tried to avoid QT prolonging medications   Hypokalemia: Acute. Initial potassium 3.4 on admission. - Give 20 mEq of potassium chloride 1 dose now - Continue to monitor and replace as needed  Anemia of chronic disease: Initial hemoglobin of 11.2. - F/u repeat CBC in a.m.  Anxiety - Xanax prn anxiety   Hyperlipidemia - Continue simvastatin  DVT prophylaxis: SCD Code Status: Full  Family Communication: Discussed plan of care with patient and family present at bedside Disposition Plan: TBD  Consults called: Neurology  Admission status: Inpatient   Norval Morton MD [facility] Pager [phone]  If 7PM-7AM, please contact night-coverage [email or web address] Password [facility]  02/23/2017, 8:42 PM

## 2017-02-23 NOTE — ED Notes (Signed)
Pt now notes that she took 50mg  Benadryl while at Dialysis for itching - she states this usually makes her drowsy and can sometimes make her confused. She exhibits appropriate concentration and judgment at this time.

## 2017-02-23 NOTE — ED Notes (Signed)
Spoke with Dr. Tamala Julian regarding MRI results and BP parameters. Dr. Tamala Julian to modify orders as needed and to call Neurology.

## 2017-02-24 ENCOUNTER — Inpatient Hospital Stay (HOSPITAL_COMMUNITY): Payer: Medicare Other

## 2017-02-24 DIAGNOSIS — I639 Cerebral infarction, unspecified: Secondary | ICD-10-CM

## 2017-02-24 DIAGNOSIS — R9431 Abnormal electrocardiogram [ECG] [EKG]: Secondary | ICD-10-CM | POA: Diagnosis present

## 2017-02-24 DIAGNOSIS — I63321 Cerebral infarction due to thrombosis of right anterior cerebral artery: Secondary | ICD-10-CM

## 2017-02-24 DIAGNOSIS — I6789 Other cerebrovascular disease: Secondary | ICD-10-CM

## 2017-02-24 DIAGNOSIS — E876 Hypokalemia: Secondary | ICD-10-CM | POA: Diagnosis present

## 2017-02-24 LAB — MAGNESIUM: MAGNESIUM: 2.3 mg/dL (ref 1.7–2.4)

## 2017-02-24 LAB — HEMOGLOBIN A1C
Hgb A1c MFr Bld: 4.6 % — ABNORMAL LOW (ref 4.8–5.6)
Mean Plasma Glucose: 85 mg/dL

## 2017-02-24 LAB — ECHOCARDIOGRAM COMPLETE
Height: 62 in
WEIGHTICAEL: 1915.2 [oz_av]

## 2017-02-24 LAB — CBC
HEMATOCRIT: 36.4 % (ref 36.0–46.0)
Hemoglobin: 11.2 g/dL — ABNORMAL LOW (ref 12.0–15.0)
MCH: 28.1 pg (ref 26.0–34.0)
MCHC: 30.8 g/dL (ref 30.0–36.0)
MCV: 91.5 fL (ref 78.0–100.0)
PLATELETS: 180 10*3/uL (ref 150–400)
RBC: 3.98 MIL/uL (ref 3.87–5.11)
RDW: 18.8 % — AB (ref 11.5–15.5)
WBC: 5.8 10*3/uL (ref 4.0–10.5)

## 2017-02-24 LAB — RENAL FUNCTION PANEL
Albumin: 3.2 g/dL — ABNORMAL LOW (ref 3.5–5.0)
Anion gap: 13 (ref 5–15)
BUN: 47 mg/dL — AB (ref 6–20)
CO2: 30 mmol/L (ref 22–32)
Calcium: 9.1 mg/dL (ref 8.9–10.3)
Chloride: 93 mmol/L — ABNORMAL LOW (ref 101–111)
Creatinine, Ser: 6.42 mg/dL — ABNORMAL HIGH (ref 0.44–1.00)
GFR, EST AFRICAN AMERICAN: 7 mL/min — AB (ref >60)
GFR, EST NON AFRICAN AMERICAN: 6 mL/min — AB (ref >60)
Glucose, Bld: 90 mg/dL (ref 65–99)
POTASSIUM: 4.2 mmol/L (ref 3.5–5.1)
Phosphorus: 6.9 mg/dL — ABNORMAL HIGH (ref 2.5–4.6)
Sodium: 136 mmol/L (ref 135–145)

## 2017-02-24 LAB — AMMONIA: AMMONIA: 30 umol/L (ref 9–35)

## 2017-02-24 LAB — TSH: TSH: 2.828 u[IU]/mL (ref 0.350–4.500)

## 2017-02-24 LAB — LIPID PANEL
CHOL/HDL RATIO: 3.3 ratio
Cholesterol: 171 mg/dL (ref 0–200)
HDL: 52 mg/dL (ref >40)
LDL Cholesterol: 101 mg/dL — ABNORMAL HIGH (ref 0–99)
Triglycerides: 92 mg/dL (ref <150)
VLDL: 18 mg/dL (ref 0–40)

## 2017-02-24 MED ORDER — NEPRO/CARBSTEADY PO LIQD
237.0000 mL | Freq: Two times a day (BID) | ORAL | Status: DC
  Administered 2017-02-24 – 2017-02-26 (×5): 237 mL via ORAL
  Filled 2017-02-24 (×7): qty 237

## 2017-02-24 MED ORDER — SIMVASTATIN 20 MG PO TABS
20.0000 mg | ORAL_TABLET | Freq: Every day | ORAL | Status: DC
  Administered 2017-02-24 – 2017-02-25 (×2): 20 mg via ORAL
  Filled 2017-02-24 (×2): qty 1

## 2017-02-24 MED ORDER — CLOPIDOGREL BISULFATE 75 MG PO TABS
75.0000 mg | ORAL_TABLET | Freq: Every day | ORAL | Status: DC
  Administered 2017-02-26: 75 mg via ORAL
  Filled 2017-02-24 (×2): qty 1

## 2017-02-24 MED ORDER — CALCITRIOL 0.5 MCG PO CAPS
2.2500 ug | ORAL_CAPSULE | ORAL | Status: DC
  Filled 2017-02-24: qty 1

## 2017-02-24 NOTE — Progress Notes (Signed)
  Echocardiogram 2D Echocardiogram has been performed.  Jennette Dubin 02/24/2017, 2:53 PM

## 2017-02-24 NOTE — Care Management Note (Signed)
Case Management Note  Patient Details  Name: [name] MRN: [id number] Date of Birth: Sep 07, 1951  Subjective/Objective:                  Patient was admitted with CVA. Undergoes HD on MWF. CM will follow for discharge needs pending therapy evals and physician orders.  Action/Plan:   Expected Discharge Date:                  Expected Discharge Plan:     In-House Referral:     Discharge planning Services     Post Acute Care Choice:    Choice offered to:     DME Arranged:    DME Agency:     HH Arranged:    HH Agency:     Status of Service:     If discussed at [employer] of Stay Meetings, dates discussed:    Additional Comments:  Rolm Baptise, RN 02/24/2017, 11:53 AM

## 2017-02-24 NOTE — Progress Notes (Addendum)
STROKE TEAM PROGRESS NOTE   HISTORY OF PRESENT ILLNESS (per record) [name] is an 66 y.o. female who had dialysis today when patient's sister went to pick her up and noted that she was not acting her usual self. Patient was taken to go to the ED. Patient on arrival stated she was fuzzy headed. She's had TIAs in the past. When asked how she feels she states that she just feels fuzzy headed, she is very lethargic and specifically denies having focal symptoms. She was was to have dialysis yesterday however she missed secondary to the snow. While in CT, patient had no localizing or lateralizing symptoms. The patient answered all questions appropriately. Patient does continue to say that she just does not feel "right in the head." Code stroke was canceled. She was LKE 02/23/2017 at 1100. Patient was not administered IV t-PA secondary to no focal neurologic symptoms. She was admitted for further evaluation and treatment.   SUBJECTIVE (INTERVAL HISTORY) No family is at bedside. She feels good today, no complains and no more "fuzzy headed". She does endorse occasional heart palpitations but short lasting. She had HD yesterday. MRI showed right ACA small infarct.    OBJECTIVE Temp:  [97.9 F (36.6 C)-98.2 F (36.8 C)] 97.9 F (36.6 C) (03/14 0913) Pulse Rate:  [70-82] 80 (03/14 1100) Cardiac Rhythm: Normal sinus rhythm (03/14 1100) Resp:  [14-20] 20 (03/13 2300) BP: (135-180)/(76-96) 138/76 (03/14 1100) SpO2:  [92 %-100 %] 97 % (03/14 0913) Weight:  [54.3 kg (119 lb 11.2 oz)-55.1 kg (121 lb 7.6 oz)] 54.3 kg (119 lb 11.2 oz) (03/13 2300)  CBC:  Recent Labs Lab 02/23/17 1538 02/23/17 1542 02/24/17 0155  WBC 5.2  --  5.8  NEUTROABS 3.1  --   --   HGB 11.2* 12.6 11.2*  HCT 35.7* 37.0 36.4  MCV 91.3  --  91.5  PLT 177  --  409    Basic Metabolic Panel:  Recent Labs Lab 02/23/17 1538 02/23/17 1542 02/24/17 0155  NA 139 137 136  K 3.4* 3.4* 4.2  CL 93* 94* 93*  CO2 31  --  30   GLUCOSE 140* 137* 90  BUN 36* 37* 47*  CREATININE 5.18* 5.30* 6.42*  CALCIUM 8.7*  --  9.1  MG  --   --  2.3  PHOS  --   --  6.9*    Lipid Panel:    Component Value Date/Time   CHOL 171 02/24/2017 0155   TRIG 92 02/24/2017 0155   HDL 52 02/24/2017 0155   CHOLHDL 3.3 02/24/2017 0155   VLDL 18 02/24/2017 0155   LDLCALC 101 (H) 02/24/2017 0155   HgbA1c:  Lab Results  Component Value Date   HGBA1C 5.0 11/17/2015   Urine Drug Screen: No results found for: LABOPIA, COCAINSCRNUR, LABBENZ, AMPHETMU, THCU, LABBARB    IMAGING I have personally reviewed the radiological images below and agree with the radiology interpretations.  Dg Chest 2 View 02/23/2017 Cardiomegaly and calcific aortic atherosclerosis without pulmonary edema.   Ct Head Code Stroke W/o Cm 02/23/2017 1. No acute hemorrhage or acute cortically based infarct identified. Advanced small vessel disease. 2. ASPECTS is 10.   Mr Brain Wo Contrast 02/23/2017  1. 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 Petechial hemorrhage but no associated mass effect. 2. No other acute intracranial abnormality. Extensive chronic small vessel disease with some progression elsewhere since July 2017.   Mr [facility] Contrast  02/24/2017 Poor signal in the distal right M1 segment is favored artifactual rather than stenotic. As such, no convincing change compared to 2016. No findings in the Raymond distribution to correlate with the acute infarct.   CUS - Bilateral: 1-39% ICA stenosis. Vertebral artery flow is antegrade.  TTE - Left ventricle: The cavity size was normal. Wall thickness was   increased in a pattern of mild LVH. Systolic function was normal.   The estimated ejection fraction was in the range of 50% to 55%.   Wall motion was normal; there were no regional wall motion   abnormalities. Features are consistent with a pseudonormal left   ventricular filling pattern, with concomitant  abnormal relaxation   and increased filling pressure (grade 2 diastolic dysfunction). - Aortic valve: There was trivial regurgitation. - Left atrium: The atrium was severely dilated. - Right atrium: The atrium was mildly dilated. - Tricuspid valve: There was moderate regurgitation. - Pulmonary arteries: Systolic pressure was moderately increased.   PA peak pressure: 58 mm Hg (S).   PHYSICAL EXAM  Temp:  [97.9 F (36.6 C)-98.2 F (36.8 C)] 97.9 F (36.6 C) (03/14 0913) Pulse Rate:  [70-82] 80 (03/14 1100) Resp:  [20] 20 (03/13 2300) BP: (138-180)/(76-96) 138/76 (03/14 1100) SpO2:  [92 %-100 %] 97 % (03/14 0913) Weight:  [119 lb 11.2 oz (54.3 kg)] 119 lb 11.2 oz (54.3 kg) (03/13 2300)  General - cachectic, well developed, in no apparent distress.  Ophthalmologic - Fundi not visualized due to eye movement.  Cardiovascular - Regular rate and rhythm.  Mental Status -  Level of arousal and orientation to time, place, and person were intact. Language including expression, naming, repetition, comprehension was assessed and found intact. Fund of Knowledge was assessed and was intact.  Cranial Nerves II - XII - II - Visual field intact OU. III, IV, VI - Extraocular movements intact. V - Facial sensation intact bilaterally. VII - Facial movement intact bilaterally. VIII - Hearing & vestibular intact bilaterally. X - Palate elevates symmetrically. XI - Chin turning & shoulder shrug intact bilaterally. XII - Tongue protrusion intact.  Motor Strength - The patient's strength was normal in all extremities and pronator drift was absent.  Bulk was normal and fasciculations were absent.   Motor Tone - Muscle tone was assessed at the neck and appendages and was normal.  Reflexes - The patient's reflexes were 1+ in all extremities and she had no pathological reflexes.  Sensory - Light touch, temperature/pinprick were assessed and were symmetrical.    Coordination - The patient had  normal movements in the hands and feet with no ataxia or dysmetria.  Tremor was absent.  Gait and Station - deferred   ASSESSMENT/PLAN [name] is a 66 y.o. female with history of ESRD on HD, CHF, HTN, TIA/CVA and anemia of chronic disease presenting following HD feeling "funny headed". She did not receive IV t-PA due to non-focal neurologic exam.   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 Infarct most likely due to secondary to small vessel disease source especially in the setting of possible cerebral hypoperfusion s/p HD. However, pt does admit occasional short lasting heart palpitation, therefore 30 day cardiac event monitoring will be recommended to perform as outpt to rule out afib.  Resultant  No significant neuro deficit  Code stroke CT. No acute abnormality. small vessel disease. 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  Extensive small vessel disease. Old pontine, and b/l thalamic infarcts.  MRA  Poor signal distal R M1 (artifactual). No significant findings  Carotid Doppler  unremarkable  2D Echo  EF 50-55%  Recommend 30 day cardiac event monitoring as outpt to rule out afib  LDL 101  HgbA1c pending  SCDs for VTE prophylaxis  Diet Heart Room service appropriate? Yes; Fluid consistency: Thin  aspirin 325 mg daily prior to admission, now on aspirin 325 mg daily. Recommend to switch to plavix for further stroke prevention.   Patient counseled to be compliant with her antithrombotic medications  Ongoing aggressive stroke risk factor management  Therapy recommendations:  NO PT  Disposition:  pending   Hypertension  Stable  Permissive hypertension (OK if < 220/120) but gradually normalize in 5-7 days  Long-term BP goal normotensive  Hyperlipidemia  Home meds:  zocor 10 mg daily resumed in hospital  LDL 101, goal < 70  Increase zocor to 20mg  daily  Continue  statin at discharge  Other Stroke Risk Factors  Advanced age  Former Cigarette smoker, quit 25 years ago  Hx TIA  Admission for L facial numbness in June and Dec of 2016.   07/2014 scattered small subacute and remote white matter infarcts [facility])  HX CHF  Other Active Problems  Acute encephalopathy  ESRD on HD MWF  Prolonged QTC  Hypokalemia  Anemia of chronic disease  [facility] day # 1  Neurology will sign off. Please call with questions. Pt will follow up with Cecille Rubin NP at [facility] in about 6 weeks. Thanks for the consult.  Rosalin Hawking, MD PhD Stroke Neurology 02/24/2017 6:19 PM   To contact Stroke Continuity provider, please refer to [email or web address] After hours, contact General Neurology

## 2017-02-24 NOTE — Evaluation (Signed)
Occupational Therapy Evaluation Patient Details Name: [name] MRN: [id number] DOB: 23-Dec-1950 Today's Date: 02/24/2017    History of Present Illness Pt is a 66 y/o female with medical history significant of ESRD on HD(M/W/F), CHF, HTN, TIA/CVA, and anemia of chronic disease; who presents after being found to be acutely altered during hemodialysis. Patient notes that she often misplacing bills or forgetting to pay them on time. Associated symptoms include difficulty with balance, unsteady gait, right-sided headaches, fatigue, and intermittent palpitations. In the last few weeks she has fallen after tripped over the dog and reports running into things after losing her balance in her home. MRI of the brain which revealed acute on chronic white matter infarct on the body of the right corpus callosum.   Clinical Impression   PTA Pt independent in ADL and mobility. Pt min guard for ADL and mobility with RW this session, please see performance level below. Pt will benefit from skilled OT in the acute setting to maximize safety and independence in ADL. Next session will focus on energy conservation education, fall education, and tub transfer with shower chair.     Follow Up Recommendations  No OT follow up;Supervision - Intermittent    Equipment Recommendations  Tub/shower seat    Recommendations for Other Services       Precautions / Restrictions Precautions Precautions: Fall Restrictions Weight Bearing Restrictions: No      Mobility Bed Mobility Overal bed mobility: Modified Independent             General bed mobility comments: +rail, increased time  Transfers Overall transfer level: Needs assistance Equipment used: Rolling walker (2 wheeled) Transfers: Sit to/from Stand Sit to Stand: Min guard         General transfer comment: min guard for safety    Balance Overall balance assessment: Needs assistance Sitting-balance support: No upper extremity  supported;Feet supported Sitting balance-Leahy Scale: Good Sitting balance - Comments: able to don socks sitting EOB   Standing balance support: No upper extremity supported;During functional activity Standing balance-Leahy Scale: Fair Standing balance comment: sink level ADL, leaning on sink                            ADL Overall ADL's : Needs assistance/impaired     Grooming: Wash/dry hands;Wash/dry face;Oral care;Min guard;Standing Grooming Details (indicate cue type and reason): sink level, using hips against sink for balance, and using RW for balance Upper Body Bathing: Set up;Sitting   Lower Body Bathing: Set up;Sitting/lateral leans   Upper Body Dressing : Modified independent   Lower Body Dressing: Supervision/safety;Sit to/from stand Lower Body Dressing Details (indicate cue type and reason): to don BLE socks and underwear         Tub/ Shower Transfer: Tub transfer;Min Chief Executive Officer Details (indicate cue type and reason): Pt stated that she feels unsafe standing in her tub Functional mobility during ADLs: Min guard;Rolling walker;Cueing for safety General ADL Comments: Pt overall at min guard level for sink level ADL and functional mobility within room with RW. vc for safety with RW.Marland Kitchen     Vision Baseline Vision/History: Wears glasses Wears Glasses: Reading only Patient Visual Report: No change from baseline (glasses not present, able to find items around room and read) Vision Assessment?: No apparent visual deficits     Perception     Praxis      Pertinent Vitals/Pain Pain Assessment: No/denies pain     Hand Dominance Right  Extremity/Trunk Assessment Upper Extremity Assessment Upper Extremity Assessment: Generalized weakness   Lower Extremity Assessment Lower Extremity Assessment: Defer to PT evaluation   Cervical / Trunk Assessment Cervical / Trunk Assessment: Normal   Communication Communication Communication: No  difficulties   Cognition Arousal/Alertness: Awake/alert Behavior During Therapy: WFL for tasks assessed/performed Overall Cognitive Status: Within Functional Limits for tasks assessed                     General Comments       Exercises       Shoulder Instructions      Home Living Family/patient expects to be discharged to:: Private residence Living Arrangements: Other (Comment) (renters upstairs she knows from church) Available Help at Discharge: Family;Friend(s);Available PRN/intermittently Type of Home: House Home Access: Stairs to enter [employer] of Steps: 2 Entrance Stairs-Rails: Right;Left;Can reach both Home Layout: Two level;Able to live on main level with bedroom/bathroom     Bathroom Shower/Tub: Tub/shower unit Shower/tub characteristics: Architectural technologist: Standard     Home Equipment: Cane - single point          Prior Functioning/Environment Level of Independence: Independent        Comments: no longer drives, mainly does microwave meals, independent in ADL; Pt reports she has just been very tired lately.        OT Problem List: Decreased activity tolerance;Impaired balance (sitting and/or standing);Decreased knowledge of use of DME or AE      OT Treatment/Interventions: Energy conservation;DME and/or AE instruction;Therapeutic activities;Patient/family education;Balance training;Self-care/ADL training    OT Goals(Current goals can be found in the care plan section) Acute Rehab OT Goals Patient Stated Goal: to be as independent as possible - and stay in her home as long as possible OT Goal Formulation: With patient Time For Goal Achievement: 03/10/17 Potential to Achieve Goals: Good ADL Goals Pt Will Perform Tub/Shower Transfer: Tub transfer;with modified independence;ambulating;shower seat Additional ADL Goal #1: Pt will recall 3 ways to conserve energy during ADL Additional ADL Goal #2: Pt will recall 3 ways to  prevent falls within the home with no verbal cues  OT Frequency: Min 2X/week   Barriers to D/C:            Co-evaluation              End of Session Equipment Utilized During Treatment: Gait belt;Rolling walker Nurse Communication: Mobility status;Other (comment) (No chair alarm, feet up, tray across Pt )  Activity Tolerance: Patient tolerated treatment well Patient left: in chair;with call bell/phone within reach  OT Visit Diagnosis: Unsteadiness on feet (R26.81);Muscle weakness (generalized) (M62.81)                ADL either performed or assessed with clinical judgement  Time: 1526-1550 OT Time Calculation (min): 24 min Charges:  OT General Charges $OT Visit: 1 Procedure OT Evaluation $OT Eval Moderate Complexity: 1 Procedure OT Treatments $Self Care/Home Management : 8-22 mins G-Codes:     Hulda Humphrey OTR/L [place] 02/24/2017, 5:10 PM

## 2017-02-24 NOTE — Progress Notes (Signed)
VASCULAR LAB PRELIMINARY  PRELIMINARY  PRELIMINARY  PRELIMINARY  Carotid duplex completed.    Preliminary report:  Bilateral:  1-39% ICA stenosis.  Vertebral artery flow is antegrade.     Davyn Morandi, RVS 02/24/2017, 3:35 PM

## 2017-02-24 NOTE — Progress Notes (Signed)
Initial Nutrition Assessment   INTERVENTION:  Nepro Shake po BID, each supplement provides 425 kcal and 19 grams protein   NUTRITION DIAGNOSIS:   Inadequate oral intake related to poor appetite as evidenced by per patient/family report, moderate depletions of muscle mass.   GOAL:   Patient will meet greater than or equal to 90% of their needs   MONITOR:   PO intake, Labs, I & O's, Weight trends, Skin  REASON FOR ASSESSMENT:   Malnutrition Screening Tool    ASSESSMENT:   66 y.o. 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 Neurology was consulted and recommended MRI of the brain which revealed acute on chronic white matter infarct on the body of the right corpus callosum.  RD familiar with pt from previous admission; pt has chronic poor appetite. Pt reports that she has been eating a little better recently with some improvement in her appetite. She states that she drinks Nepro Shake after each dialysis session, but not daily. Breakfast tray at bedside with ~60% consumed. Pt agreeable to receiving Nepro BID while inpatient.  Pt's weight has been stable at ~117 to 119 lbs. She continues to have moderate muscle wasting in legs and clavicles.   Labs: low chloride, elevated BUN, elevated creatinine, high phosphorus, low hemoglobin  Diet Order:  Diet Heart Room service appropriate? Yes; Fluid consistency: Thin  Skin:  Reviewed, no issues (closed incisions)  Last BM:  3/14  Height:   Ht Readings from Last 1 Encounters:  02/23/17 5\' 2"  (1.575 m)    Weight:   Wt Readings from Last 1 Encounters:  02/23/17 119 lb 11.2 oz (54.3 kg)    Ideal Body Weight:  50 kg  BMI:  Body mass index is 21.89 kg/m.  Estimated Nutritional Needs:   Kcal:  1650-1900  Protein:  65-75 grams  Fluid:  per MD  EDUCATION NEEDS:   No education needs  identified at this time  Scarlette Ar RD, LDN, CSP Inpatient Clinical Dietitian Pager: [phone] After Hours Pager: [phone]

## 2017-02-24 NOTE — Progress Notes (Signed)
PROGRESS NOTE    [name]  EQA:[id number] DOB: October 23, 1951 DOA: 02/23/2017 PCP: Kandice Hams, MD   Brief Narrative:  66 y.o. 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   Assessment & Plan:   Principal Problem:   CVA (cerebral vascular accident) [facility]) -Neurology on board and patient currently undergoing stroke work up - Per my discussion with neurologist patient may be undergoing these problems due to low blood pressures during dialysis. He suspects patient has microvascular disease  Active Problems:   Anemia in CKD (chronic kidney disease) - Stable no active bleeding    HTN (hypertension) - Patient is on losartan   Hyperlipidemia - Continue statin    ESRD (end stage renal disease) on dialysis ([place]) - Hemodialysis per nephrology   Prolonged Q-T interval on ECG    Hypokalemia - Resolved  DVT prophylaxis: SCD's Code Status: Full Family Communication: none at bedside Disposition Plan: Once work up complete d/c. Most likely next am.   Consultants:   Neurology  Nephrology   Procedures: HD   Antimicrobials: None   Subjective: Patient reports no new complaints  Objective: Vitals:   02/24/17 0500 02/24/17 0700 02/24/17 0913 02/24/17 1100  BP: (!) 147/88 (!) 143/78 (!) 143/82 138/76  Pulse: 79 76 79 80  Resp:      Temp:   97.9 F (36.6 C)   TempSrc:   Oral   SpO2: 96% 99% 97%   Weight:      Height:       No intake or output data in the 24 hours ending 02/24/17 1715 Filed Weights   02/23/17 1521 02/23/17 2300  Weight: 55.1 kg (121 lb 7.6 oz) 54.3 kg (119 lb 11.2 oz)    Examination:  General exam: Appears calm and comfortable, in nad. Respiratory system: Clear to auscultation. Respiratory effort normal. Cardiovascular system: S1 & S2 heard, RRR.  Gastrointestinal system: Abdomen is nondistended,  soft and nontender. No organomegaly or masses felt. Normal bowel sounds heard. Central nervous system: Alert and oriented. No facial asymmetry Extremities: Symmetric 5 x 5 power. Skin: No rashes, lesions or ulcers Psychiatry: Judgement and insight appear normal. Mood & affect appropriate.     Data Reviewed: I have personally reviewed following labs and imaging studies  CBC:  Recent Labs Lab 02/23/17 1538 02/23/17 1542 02/24/17 0155  WBC 5.2  --  5.8  NEUTROABS 3.1  --   --   HGB 11.2* 12.6 11.2*  HCT 35.7* 37.0 36.4  MCV 91.3  --  91.5  PLT 177  --  979   Basic Metabolic Panel:  Recent Labs Lab 02/23/17 1538 02/23/17 1542 02/24/17 0155  NA 139 137 136  K 3.4* 3.4* 4.2  CL 93* 94* 93*  CO2 31  --  30  GLUCOSE 140* 137* 90  BUN 36* 37* 47*  CREATININE 5.18* 5.30* 6.42*  CALCIUM 8.7*  --  9.1  MG  --   --  2.3  PHOS  --   --  6.9*   GFR: Estimated Creatinine Clearance: 6.8 mL/min (by C-G formula based on SCr of 6.42 mg/dL (H)). Liver Function Tests:  Recent Labs Lab 02/23/17 1538 02/24/17 0155  AST 26  --   ALT 11*  --   ALKPHOS 87  --   BILITOT 0.8  --   PROT 7.3  --   ALBUMIN 3.4* 3.2*  No results for input(s): LIPASE, AMYLASE in the last 168 hours.  Recent Labs Lab 02/24/17 0155  AMMONIA 30   Coagulation Profile:  Recent Labs Lab 02/23/17 1538  INR 1.10   Cardiac Enzymes: No results for input(s): CKTOTAL, CKMB, CKMBINDEX, TROPONINI in the last 168 hours. BNP (last 3 results) No results for input(s): PROBNP in the last 8760 hours. HbA1C: No results for input(s): HGBA1C in the last 72 hours. CBG:  Recent Labs Lab 02/23/17 1554  GLUCAP 111*   Lipid Profile:  Recent Labs  02/24/17 0155  CHOL 171  HDL 52  LDLCALC 101*  TRIG 92  CHOLHDL 3.3   Thyroid Function Tests:  Recent Labs  02/24/17 0155  TSH 2.828   Anemia Panel: No results for input(s): VITAMINB12, FOLATE, FERRITIN, TIBC, IRON, RETICCTPCT in the last 72  hours. Sepsis Labs: No results for input(s): PROCALCITON, LATICACIDVEN in the last 168 hours.  No results found for this or any previous visit (from the past 240 hour(s)).       Radiology Studies: Dg Chest 2 View  Result Date: 02/23/2017 CLINICAL DATA:  Altered mental status EXAM: CHEST  2 VIEW COMPARISON:  Chest radiograph 02/04/2017 FINDINGS: Right IJ hemodialysis catheter tip overlies the lower SVC, in unchanged position. There is unchanged cardiomegaly with calcific aortic atherosclerosis. No pleural effusion or pneumothorax. No focal airspace consolidation or pulmonary edema. IMPRESSION: Cardiomegaly and calcific aortic atherosclerosis without pulmonary edema. Electronically Signed   By: Ulyses Jarred M.D.   On: 02/23/2017 22:32   Mr Jodene Nam Head Wo Contrast  Result Date: 02/24/2017 CLINICAL DATA:  CVA. EXAM: MRA HEAD WITHOUT CONTRAST TECHNIQUE: Angiographic images of the Circle of Willis were obtained using MRA technique without intravenous contrast. COMPARISON:  05/09/2015 FINDINGS: Symmetric carotid arteries. There is poor signal in the mid and distal right M1 segment with normalized appearance of M2 and distal vessels. This appearance is likely artifactual related to direction of flow. No focal narrowing is noted on the source images. No stenosis or branch occlusion noted in the ACA distribution to correlate with the acute infarct. Right dominant vertebrobasilar system. Much of the left vertebral flows to the PICA. Major posterior circulation vessels are smooth and widely patent. Mild narrowing along the proximal right PICA. Negative for aneurysm. IMPRESSION: Poor signal in the distal right M1 segment is favored artifactual rather than stenotic. As such, no convincing change compared to 2016. No findings in the [place] distribution to correlate with the acute infarct. Electronically Signed   By: Monte Fantasia M.D.   On: 02/24/2017 10:12   Mr Brain Wo Contrast  Result Date: 02/23/2017 CLINICAL  DATA:  67 year old female with confusion, generalize weakness, recently missed hemodialysis. Initial encounter. EXAM: MRI HEAD WITHOUT CONTRAST TECHNIQUE: Multiplanar, multiecho pulse sequences of the brain and surrounding structures were obtained without intravenous contrast. COMPARISON:  Head CT 1531 hours today. Brain MRI 06/24/2016 and earlier. FINDINGS: Brain: There is a small 9 mm focus of restricted diffusion along the right body of the corpus callosum best seen on series 3, image 30. There is subtle associated T2 and FLAIR hyperintensity (series 12, image 14) with no significant mass effect. Punctate petechial hemorrhaged possible (series 9, image 62). No other restricted diffusion. Advanced chronic small vessel disease with numerous chronic micro hemorrhages (maximal in the deep gray matter, brainstem, and deep gray matter nuclei), and multiple chronic lacunar infarcts in the deep gray matter nuclei and brainstem - including a prominent central pontine lacune which is chronic but new  since July 2017 (series 6, image 8). Associated chronic bilateral cerebral white matter T2 and FLAIR hyperintensity, including deep white matter capsule involvement, has not significantly changed. No cortical encephalomalacia identified. No midline shift, mass effect, evidence of mass lesion, ventriculomegaly, or extra-axial collection. Cervicomedullary junction and pituitary are within normal limits. Vascular: Major intracranial vascular flow voids appear stable since 2017 including dominant and dolichoectatic distal right vertebral artery. Skull and upper cervical spine: Stable. Sinuses/Orbits: Stable and negative. Other: Mastoid air cells are clear. Visible internal auditory structures appear normal. Negative scalp soft tissues. IMPRESSION: 1. 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 Petechial hemorrhage but no associated mass effect. 2. No other acute intracranial abnormality.  Extensive chronic small vessel disease with some progression elsewhere since July 2017. Electronically Signed   By: Genevie Ann M.D.   On: 02/23/2017 18:55   Ct Head Code Stroke W/o Cm  Result Date: 02/23/2017 CLINICAL DATA:  Code stroke. 66 year old female with dizziness lethargy, generalized weakness, altered mental status. EXAM: CT HEAD WITHOUT CONTRAST TECHNIQUE: Contiguous axial images were obtained from the base of the skull through the vertex without intravenous contrast. COMPARISON:  Brain MRI 06/24/2016.  Head CT 06/11/2016, and earlier. FINDINGS: Brain: No acute intracranial hemorrhage identified. No midline shift, mass effect, or evidence of intracranial mass lesion. No ventriculomegaly. Chronic moderate to severe heterogeneity in the cerebral white matter, deep gray matter nuclei, and brainstem compatible with advanced chronic small vessel disease. No superimposed acute cortically based infarct identified. Vascular: Calcified atherosclerosis at the skull base. No suspicious intracranial vascular hyperdensity. Skull: No acute osseous abnormality identified. Chronic moderate to severe degenerative changes including suspected ligamentous hypertrophy about the odontoid. Associated widening of the atlanto dens interval. Sinuses/Orbits: Visualized paranasal sinuses and mastoids are stable and well pneumatized. Other: Previous left forehead scalp hematoma. No acute orbit or scalp soft tissue findings. Mild calcified scalp vessel atherosclerosis. ASPECTS [facility] Stroke Program Early CT Score) - Ganglionic level infarction (caudate, lentiform nuclei, internal capsule, insula, M1-M3 cortex): 7 - Supraganglionic infarction (M4-M6 cortex): 3 Total score (0-10 with 10 being normal): 10 IMPRESSION: 1. No acute hemorrhage or acute cortically based infarct identified. Advanced small vessel disease. 2. ASPECTS is 10. 3. The above was relayed via text pager to Dr. Spero Geralds On 02/23/2017 at 15:41 . Electronically Signed    By: Genevie Ann M.D.   On: 02/23/2017 15:42        Scheduled Meds: . allopurinol  150 mg Oral QHS  . aspirin EC  325 mg Oral QHS  . [START ON 02/26/2017] calcitRIOL  2.25 mcg Oral Q M,W,F-HD  . cinacalcet  30 mg Oral Q M,W,F-HD  . doxazosin  8 mg Oral QHS  . feeding supplement (NEPRO CARB STEADY)  237 mL Oral BID BM  . ferric citrate  420 mg Oral TID WC  . labetalol  200 mg Oral BID  . losartan  100 mg Oral QHS  . multivitamin  1 tablet Oral Daily  . potassium chloride  20 mEq Oral STAT  . simvastatin  10 mg Oral QHS   Continuous Infusions:   LOS: 1 day    Time spent: > 35 minutes  Velvet Bathe, MD [facility] Pager [phone]  If 7PM-7AM, please contact night-coverage [email or web address] Password [facility] 02/24/2017, 5:15 PM

## 2017-02-24 NOTE — Consult Note (Signed)
[facility] Renal Consultation Note    Indication for Consultation:  Management of ESRD/hemodialysis; anemia, hypertension/volume and secondary hyperparathyroidism  HPI: [name] is a 66 y.o. female with ESRD secondary to HTN PMH includes failed renal transplant CHF, h/o TIA/CVA. Presented to ED yesterday after dialysis feeling "lightheaded and loopy" during treatment. Brought by family to ED after treatment. Reports some trouble with balance recently. Has bruise on forehead caused by loss of balance and hitting head on bed. Has been stressed by finances recently.  Brain MRI with Small acute on chronic white matter infarct along the body of theight corpus callosum. Petechial hemorrhage but no associated mass effect. Labs stable. Admitted for further evaluation of acute on chronic CVA. Seen sitting in chair at bedside talking with family over phone. Feels good today, no complaints. Denies dizziness, HA, CA, SOB, n/v.  Dialyzes at [facility] MWF. Last HD yesterday 3/13 due to inclement weather on Monday. Usually compliant with HD and completing full treatments.   Past Medical History:  Diagnosis Date  . Adenomatous polyp of colon 10/2010, 2006, 2015  . Anemia in CKD (chronic kidney disease) 11/07/2012   s/p blood transfusion.   . Arthritis   . CHF (congestive heart failure) ([place])   . Constipation   . Depression with anxiety   . Diverticula, colon   . 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  Had deceased donor kidney transplant in 1996.  Had some early rejection then stable function for years, then had slow decline of function and went back on hemodialysis in 2012.  Gets HD TTS schedule at [facility] on [facility] still using L forearm AVF.     Marland Kitchen GERD (gastroesophageal reflux disease)   . Headache   . Hyperlipidemia   . Hypertension   . Neuromuscular disorder (HCC)     neuropathy hand and legs  . Osteoporosis   . Pneumonia   . 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 Stroke [facility]) 11/2015   TIA  . Weight loss, unintentional    Past Surgical History:  Procedure Laterality Date  . AV FISTULA PLACEMENT     for dialysis  . AV FISTULA PLACEMENT Left 11/22/2015   Procedure: ARTERIOVENOUS (AV) FISTULA CREATION-LEFT BRACHIOCEPHALIC;  Surgeon: Serafina Mitchell, MD;  Location: [place];  Service: Vascular;  Laterality: Left;  . BACK SURGERY    . CERVICAL FUSION    . CHOLECYSTECTOMY  12/02/2012   Procedure: LAPAROSCOPIC CHOLECYSTECTOMY WITH INTRAOPERATIVE CHOLANGIOGRAM;  Surgeon: Edward Jolly, MD;  Location: [place];  Service: General;  Laterality: N/A;  . EYE SURGERY Bilateral    cataract surgery  . HEMATOMA EVACUATION Left 12/24/2016   Procedure: EVACUATION HEMATOMA LEFT UPPER ARM;  Surgeon: Waynetta Sandy, MD;  Location: [place];  Service: Vascular;  Laterality: Left;  . I&D EXTREMITY Left 12/31/2016   Procedure: IRRIGATION AND DEBRIDEMENT EXTREMITY;  Surgeon: Angelia Mould, MD;  Location: [place];  Service: Vascular;  Laterality: Left;  . INSERTION OF DIALYSIS CATHETER Right 12/24/2016   Procedure: INSERTION OF DIALYSIS CATHETER;  Surgeon: Waynetta Sandy, MD;  Location: [place];  Service: Vascular;  Laterality: Right;  . INSERTION OF DIALYSIS CATHETER Right 02/04/2017   Procedure: INSERTION OF DIALYSIS CATHETER;  Surgeon: Waynetta Sandy, MD;  Location: [place];  Service: Vascular;  Laterality: Right;  . KIDNEY TRANSPLANT  1996  . PERIPHERAL VASCULAR CATHETERIZATION Left 10/23/2016   Procedure: Fistulagram;  Surgeon: Elam Dutch, MD;  Location: [place] CV LAB;  Service: Cardiovascular;  Laterality: Left;  . RESECTION OF ARTERIOVENOUS FISTULA ANEURYSM Left 11/22/2015   Procedure: RESECTION OF LEFT RADIOCEPHALIC FISTULA ANEURYSM ;  Surgeon: Serafina Mitchell, MD;  Location: [place];  Service: Vascular;   Laterality: Left;  . REVISON OF ARTERIOVENOUS FISTULA Left 12/22/2016   Procedure: REVISON OF LEFT ARTERIOVENOUS FISTULA;  Surgeon: Waynetta Sandy, MD;  Location: [place];  Service: Vascular;  Laterality: Left;  . REVISON OF ARTERIOVENOUS FISTULA Left 02/04/2017   Procedure: REVISON OF LEFT UPPER ARM ARTERIOVENOUS FISTULA;  Surgeon: Waynetta Sandy, MD;  Location: [facility] OR;  Service: Vascular;  Laterality: Left;   Family History  Problem Relation Age of Onset  . Colon cancer Brother   . Cancer Brother   . Coronary artery disease Mother 17  . Hyperlipidemia Mother   . Hypertension Mother   . Esophageal cancer Neg Hx   . Stomach cancer Neg Hx   . Rectal cancer Neg Hx    Social History:  reports that she quit smoking about 25 years ago. Her smoking use included Cigarettes. She has never used smokeless tobacco. She reports that she does not drink alcohol or use drugs. Allergies  Allergen Reactions  . Sulfa Antibiotics Other (See Comments)    Both parents allergic-so will not take  . Adhesive [Tape] Itching   Prior to Admission medications   Medication Sig Start Date End Date Taking? Authorizing Provider  acetaminophen (TYLENOL) 325 MG tablet Take 650 mg by mouth every 6 (six) hours as needed for mild pain or headache.    Yes Historical Provider, MD  allopurinol (ZYLOPRIM) 300 MG tablet Take 150 mg by mouth at bedtime.   Yes Historical Provider, MD  ALPRAZolam (XANAX) 0.25 MG tablet Take 0.25 mg by mouth 2 (two) times daily as needed for anxiety.    Yes Historical Provider, MD  aspirin EC 325 MG tablet Take 325 mg by mouth at bedtime.   Yes Historical Provider, MD  AURYXIA 1 GM 210 MG(Fe) tablet Take 420 mg by mouth 3 (three) times daily with meals.  12/22/16  Yes Historical Provider, MD  B Complex-C-Folic Acid (DIALYVITE PO) Take 1 tablet by mouth daily.   Yes Historical Provider, MD  doxazosin (CARDURA) 8 MG tablet Take 1 tablet (8 mg total) by mouth at bedtime. 12/26/16  Yes  Alvia Grove, PA-C  labetalol (NORMODYNE) 200 MG tablet Take 200 mg by mouth 2 (two) times daily.    Yes Historical Provider, MD  lactulose (CHRONULAC) 10 GM/15ML solution Take 20 g by mouth daily as needed for mild constipation.   Yes Historical Provider, MD  losartan (COZAAR) 100 MG tablet Take 100 mg by mouth at bedtime.    Yes Historical Provider, MD  meclizine (ANTIVERT) 25 MG tablet Take 1 tablet (25 mg total) by mouth 2 (two) times daily as needed for dizziness. 06/24/16  Yes Merryl Hacker, MD  promethazine (PHENERGAN) 25 MG tablet Take 25 mg by mouth every 6 (six) hours as needed for nausea or vomiting.   Yes Historical Provider, MD  SENSIPAR 30 MG tablet Take 30 mg by mouth every Monday, Wednesday, and Friday with hemodialysis.  08/20/16  Yes Historical Provider, MD  simvastatin (ZOCOR) 10 MG tablet Take 10 mg by mouth at bedtime.    Yes Historical Provider, MD  zolpidem (AMBIEN) 10 MG tablet  Take 10 mg by mouth at bedtime as needed for sleep.    Yes Historical Provider, MD  acetaminophen-codeine (TYLENOL #3) 300-30 MG tablet Take 1-2 tablets by mouth every 6 (six) hours as needed for moderate pain. Patient not taking: Reported on 02/23/2017 02/04/17   Alvia Grove, PA-C   Current Facility-Administered Medications  Medication Dose Route Frequency Provider Last Rate Last Dose  . acetaminophen (TYLENOL) tablet 650 mg  650 mg Oral Q4H PRN Norval Morton, MD       Or  . acetaminophen (TYLENOL) solution 650 mg  650 mg Per Tube Q4H PRN Norval Morton, MD       Or  . acetaminophen (TYLENOL) suppository 650 mg  650 mg Rectal Q4H PRN Norval Morton, MD      . allopurinol (ZYLOPRIM) tablet 150 mg  150 mg Oral QHS Norval Morton, MD   150 mg at 02/24/17 0007  . ALPRAZolam Duanne Moron) tablet 0.25 mg  0.25 mg Oral BID PRN Norval Morton, MD   0.25 mg at 02/23/17 2306  . aspirin EC tablet 325 mg  325 mg Oral QHS Rondell A Tamala Julian, MD      . cinacalcet (SENSIPAR) tablet 30 mg  30 mg Oral Q  M,W,F-HD Norval Morton, MD      . doxazosin (CARDURA) tablet 8 mg  8 mg Oral QHS Norval Morton, MD   8 mg at 02/23/17 2307  . feeding supplement (NEPRO CARB STEADY) liquid 237 mL  237 mL Oral BID BM Velvet Bathe, MD      . ferric citrate (AURYXIA) tablet 420 mg  420 mg Oral TID WC Rondell Charmayne Sheer, MD   420 mg at 02/24/17 1115  . labetalol (NORMODYNE) tablet 200 mg  200 mg Oral BID Norval Morton, MD   200 mg at 02/24/17 1115  . lactulose (CHRONULAC) 10 GM/15ML solution 20 g  20 g Oral Daily PRN Norval Morton, MD      . losartan (COZAAR) tablet 100 mg  100 mg Oral QHS Norval Morton, MD   100 mg at 02/23/17 2305  . meclizine (ANTIVERT) tablet 25 mg  25 mg Oral BID PRN Norval Morton, MD      . multivitamin (RENA-VIT) tablet 1 tablet  1 tablet Oral Daily Norval Morton, MD   1 tablet at 02/24/17 1115  . potassium chloride SA (K-DUR,KLOR-CON) CR tablet 20 mEq  20 mEq Oral STAT Rondell A Tamala Julian, MD      . senna-docusate (Senokot-S) tablet 1 tablet  1 tablet Oral QHS PRN Norval Morton, MD      . simvastatin (ZOCOR) tablet 10 mg  10 mg Oral QHS Norval Morton, MD   10 mg at 02/23/17 2334  . zolpidem (AMBIEN) tablet 5 mg  5 mg Oral QHS PRN Norval Morton, MD   5 mg at 02/23/17 2305    Denies fever, chills, dizziness, night sweats,  Denies Ha, visual changes Denies chest pain, palpitations, dyspnea on exertion, orthopnea, edema, last EKG/Echo Denies SOB, cough, hemoptysis (h/o COPD, bronchitis, OSA), last chest xray Denies loss of appetite, nausea, vomiting, dysphagia, change in bowel movements, hematemesis, hemorrhoids, melena, hematochezia  Denies change dysuria, change in urinary frequency Denies joint pain, muscle weakness  ROS: As per HPI otherwise negative.  Physical Exam: Vitals:   02/24/17 0500 02/24/17 0700 02/24/17 0913 02/24/17 1100  BP: (!) 147/88 (!) 143/78 (!) 143/82 138/76  Pulse: 79 76 79  80  Resp:      Temp:   97.9 F (36.6 C)   TempSrc:   Oral   SpO2: 96% 99%  97%   Weight:      Height:         General: WDWN NAD Head: NCAT sclera not icteric MMM Neck: Supple. No JVD No masses Lungs: CTA bilaterally without wheezes, rales, or rhonchi. Breathing is unlabored. Heart: RRR with S1 S2 Abdomen: soft NT + BS Lower extremities:without edema or ischemic changes, no open wounds  Neuro: A & O  X 3. Moves all extremities spontaneously. Psych:  Responds to questions appropriately with a normal affect. Dialysis Access: RIJ TDC/ LBC AVF with staples +bruit  (revision 02/04/17)  Labs: Basic Metabolic Panel:  Recent Labs Lab 02/23/17 1538 02/23/17 1542 02/24/17 0155  NA 139 137 136  K 3.4* 3.4* 4.2  CL 93* 94* 93*  CO2 31  --  30  GLUCOSE 140* 137* 90  BUN 36* 37* 47*  CREATININE 5.18* 5.30* 6.42*  CALCIUM 8.7*  --  9.1  PHOS  --   --  6.9*   Liver Function Tests:  Recent Labs Lab 02/23/17 1538 02/24/17 0155  AST 26  --   ALT 11*  --   ALKPHOS 87  --   BILITOT 0.8  --   PROT 7.3  --   ALBUMIN 3.4* 3.2*   No results for input(s): LIPASE, AMYLASE in the last 168 hours.  Recent Labs Lab 02/24/17 0155  AMMONIA 30   CBC:  Recent Labs Lab 02/23/17 1538 02/23/17 1542 02/24/17 0155  WBC 5.2  --  5.8  NEUTROABS 3.1  --   --   HGB 11.2* 12.6 11.2*  HCT 35.7* 37.0 36.4  MCV 91.3  --  91.5  PLT 177  --  180   Cardiac Enzymes: No results for input(s): CKTOTAL, CKMB, CKMBINDEX, TROPONINI in the last 168 hours. CBG:  Recent Labs Lab 02/23/17 1554  GLUCAP 111*   Iron Studies: No results for input(s): IRON, TIBC, TRANSFERRIN, FERRITIN in the last 72 hours. Studies/Results: Dg Chest 2 View  Result Date: 02/23/2017 CLINICAL DATA:  Altered mental status EXAM: CHEST  2 VIEW COMPARISON:  Chest radiograph 02/04/2017 FINDINGS: Right IJ hemodialysis catheter tip overlies the lower SVC, in unchanged position. There is unchanged cardiomegaly with calcific aortic atherosclerosis. No pleural effusion or pneumothorax. No focal airspace  consolidation or pulmonary edema. IMPRESSION: Cardiomegaly and calcific aortic atherosclerosis without pulmonary edema. Electronically Signed   By: Ulyses Jarred M.D.   On: 02/23/2017 22:32   Mr Jodene Nam Head Wo Contrast  Result Date: 02/24/2017 CLINICAL DATA:  CVA. EXAM: MRA HEAD WITHOUT CONTRAST TECHNIQUE: Angiographic images of the Circle of Willis were obtained using MRA technique without intravenous contrast. COMPARISON:  05/09/2015 FINDINGS: Symmetric carotid arteries. There is poor signal in the mid and distal right M1 segment with normalized appearance of M2 and distal vessels. This appearance is likely artifactual related to direction of flow. No focal narrowing is noted on the source images. No stenosis or branch occlusion noted in the ACA distribution to correlate with the acute infarct. Right dominant vertebrobasilar system. Much of the left vertebral flows to the PICA. Major posterior circulation vessels are smooth and widely patent. Mild narrowing along the proximal right PICA. Negative for aneurysm. IMPRESSION: Poor signal in the distal right M1 segment is favored artifactual rather than stenotic. As such, no convincing change compared to 2016. No findings in the [place] distribution to  correlate with the acute infarct. Electronically Signed   By: Monte Fantasia M.D.   On: 02/24/2017 10:12   Mr Brain Wo Contrast  Result Date: 02/23/2017 CLINICAL DATA:  66 year old female with confusion, generalize weakness, recently missed hemodialysis. Initial encounter. EXAM: MRI HEAD WITHOUT CONTRAST TECHNIQUE: Multiplanar, multiecho pulse sequences of the brain and surrounding structures were obtained without intravenous contrast. COMPARISON:  Head CT 1531 hours today. Brain MRI 06/24/2016 and earlier. FINDINGS: Brain: There is a small 9 mm focus of restricted diffusion along the right body of the corpus callosum best seen on series 3, image 30. There is subtle associated T2 and FLAIR hyperintensity (series 12,  image 14) with no significant mass effect. Punctate petechial hemorrhaged possible (series 9, image 62). No other restricted diffusion. Advanced chronic small vessel disease with numerous chronic micro hemorrhages (maximal in the deep gray matter, brainstem, and deep gray matter nuclei), and multiple chronic lacunar infarcts in the deep gray matter nuclei and brainstem - including a prominent central pontine lacune which is chronic but new since July 2017 (series 6, image 8). Associated chronic bilateral cerebral white matter T2 and FLAIR hyperintensity, including deep white matter capsule involvement, has not significantly changed. No cortical encephalomalacia identified. No midline shift, mass effect, evidence of mass lesion, ventriculomegaly, or extra-axial collection. Cervicomedullary junction and pituitary are within normal limits. Vascular: Major intracranial vascular flow voids appear stable since 2017 including dominant and dolichoectatic distal right vertebral artery. Skull and upper cervical spine: Stable. Sinuses/Orbits: Stable and negative. Other: Mastoid air cells are clear. Visible internal auditory structures appear normal. Negative scalp soft tissues. IMPRESSION: 1. 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 Petechial hemorrhage but no associated mass effect. 2. No other acute intracranial abnormality. Extensive chronic small vessel disease with some progression elsewhere since July 2017. Electronically Signed   By: Genevie Ann M.D.   On: 02/23/2017 18:55   Ct Head Code Stroke W/o Cm  Result Date: 02/23/2017 CLINICAL DATA:  Code stroke. 66 year old female with dizziness lethargy, generalized weakness, altered mental status. EXAM: CT HEAD WITHOUT CONTRAST TECHNIQUE: Contiguous axial images were obtained from the base of the skull through the vertex without intravenous contrast. COMPARISON:  Brain MRI 06/24/2016.  Head CT 06/11/2016, and earlier. FINDINGS:  Brain: No acute intracranial hemorrhage identified. No midline shift, mass effect, or evidence of intracranial mass lesion. No ventriculomegaly. Chronic moderate to severe heterogeneity in the cerebral white matter, deep gray matter nuclei, and brainstem compatible with advanced chronic small vessel disease. No superimposed acute cortically based infarct identified. Vascular: Calcified atherosclerosis at the skull base. No suspicious intracranial vascular hyperdensity. Skull: No acute osseous abnormality identified. Chronic moderate to severe degenerative changes including suspected ligamentous hypertrophy about the odontoid. Associated widening of the atlanto dens interval. Sinuses/Orbits: Visualized paranasal sinuses and mastoids are stable and well pneumatized. Other: Previous left forehead scalp hematoma. No acute orbit or scalp soft tissue findings. Mild calcified scalp vessel atherosclerosis. ASPECTS [facility] Stroke Program Early CT Score) - Ganglionic level infarction (caudate, lentiform nuclei, internal capsule, insula, M1-M3 cortex): 7 - Supraganglionic infarction (M4-M6 cortex): 3 Total score (0-10 with 10 being normal): 10 IMPRESSION: 1. No acute hemorrhage or acute cortically based infarct identified. Advanced small vessel disease. 2. ASPECTS is 10. 3. The above was relayed via text pager to Dr. Spero Geralds On 02/23/2017 at 15:41 . Electronically Signed   By: Genevie Ann M.D.   On: 02/23/2017 15:42    Dialysis  Orders:  [facility] M[facility] 3h25mins 180F BFR 350 2/2 EDW 54kg Access RIJ TDC / LBC AVF (resection of pseudoaneurysm 02/04/17) -Heparin 3000 U IV [place] -Mircera 225 mcg IV q 2 weeks (last 2/28) -Calcitriol 2.25 mcg po q HD  -Sensipar 30mg  PO post HD   Assessment/Plan: 1.  Acute on chronic CVA -  per primary neuro following  2.  ESRD -  Usually MWF - Last HD Tues No indication for urgent HD today - plan off schedule tomorrow  3.  Hypertension/volume  - BP controlled at present/No gross volume excess on  exam UF to EDW  4.  Anemia  - Hgb 11.2 follow - on OP esa no indication to redose yet  5.  Metabolic bone disease -  Cont VDRA/ Sensipar Continue Auryxia binder with ^P 6.  Nutrition - Renal diet/vitamins/proetin supp with low alb  Lynnda Child PA-C [place] Pager [phone] 02/24/2017, 4:29 PM    I have seen and examined this patient and agree with plan and assessment in the above note with renal recommendations/intervention highlighted. Continue with HD on MWF schedule while she remains an inpatient.  Pt was seen on 02/24/17 at 4:00pm with Larina Earthly, PA-C. Broadus John A Carlette Palmatier,MD 02/25/2017 8:00 AM

## 2017-02-24 NOTE — Evaluation (Signed)
Physical Therapy Evaluation Patient Details Name: [name] MRN: [id number] DOB: [birth year] Today's Date: 02/24/2017   History of Present Illness  Pt is a 66 y/o female with medical history significant of ESRD on HD(M/W/F), CHF, HTN, TIA/CVA, and anemia of chronic disease; who presents after being found to be acutely altered during hemodialysis. Patient notes that she often misplacing bills or forgetting to pay them on time. Associated symptoms include difficulty with balance, unsteady gait, right-sided headaches, fatigue, and intermittent palpitations. In the last few weeks she has fallen after tripped over the dog and reports running into things after losing her balance in her home. MRI of the brain which revealed acute on chronic white matter infarct on the body of the right corpus callosum.  Clinical Impression  Pt admitted with above diagnosis. Pt currently with functional limitations due to the deficits listed below (see PT Problem List). On eval, pt required min guard assist for all functional mobility. She ambulated 60 feet without AD. Pt declined hallway ambulation due to fatigue. Pt will benefit from skilled PT to increase their independence and safety with mobility to allow discharge to the venue listed below.  Pt's mobility primarily limited by fatigue. She demo good safety awareness and awareness of her deficits/limitations. PT to follow acutely.     Follow Up Recommendations No PT follow up;Supervision - Intermittent (pt declining any follow-up services)    Equipment Recommendations  None recommended by PT    Recommendations for Other Services       Precautions / Restrictions Precautions Precautions: Fall      Mobility  Bed Mobility Overal bed mobility: Modified Independent             General bed mobility comments: +rail, increased time  Transfers Overall transfer level: Needs assistance Equipment used: None Transfers: Sit to/from [employer]  Transfers Sit to Stand: Min guard Stand pivot transfers: Min guard       General transfer comment: min guard for safety  Ambulation/Gait Ambulation/Gait assistance: Min guard Ambulation Distance (Feet): 60 Feet Assistive device: None Gait Pattern/deviations: Step-through pattern;Decreased stride length   Gait velocity interpretation: Below normal speed for age/gender General Gait Details: slow, steady gait. Pt declining ambulation in hallway.  Stairs            Wheelchair Mobility    Modified Rankin (Stroke Patients Only) Modified Rankin (Stroke Patients Only) Pre-Morbid Rankin Score: No significant disability Modified Rankin: No significant disability     Balance Overall balance assessment: Needs assistance Sitting-balance support: No upper extremity supported;Feet supported Sitting balance-Leahy Scale: Good     Standing balance support: No upper extremity supported;During functional activity Standing balance-Leahy Scale: Good                               Pertinent Vitals/Pain Pain Assessment: No/denies pain    Home Living Family/patient expects to be discharged to:: Private residence Living Arrangements: Other relatives (renters live upstairs) Available Help at Discharge: Family;Friend(s);Available PRN/intermittently Type of Home: House Home Access: Stairs to enter Entrance Stairs-Rails: Right;Left;Can reach both Entrance Stairs-Number of Steps: 2 Home Layout: Two level;Able to live on main level with bedroom/bathroom Home Equipment: Kasandra Knudsen - single point      Prior Function Level of Independence: Independent         Comments: Pt reports she has just been very tired lately.     Hand Dominance   Dominant Hand: Right    Extremity/Trunk  Assessment   Upper Extremity Assessment Upper Extremity Assessment: Defer to OT evaluation    Lower Extremity Assessment Lower Extremity Assessment: Generalized weakness    Cervical / Trunk  Assessment Cervical / Trunk Assessment: Normal  Communication   Communication: No difficulties  Cognition Arousal/Alertness: Awake/alert Behavior During Therapy: WFL for tasks assessed/performed Overall Cognitive Status: Within Functional Limits for tasks assessed                      General Comments      Exercises     Assessment/Plan    PT Assessment Patient needs continued PT services  PT Problem List Decreased strength;Decreased activity tolerance;Decreased balance;Decreased mobility       PT Treatment Interventions Gait training;Stair training;Functional mobility training;Balance training;Therapeutic exercise;Therapeutic activities;Patient/family education    PT Goals (Current goals can be found in the Care Plan section)  Acute Rehab PT Goals Patient Stated Goal: home PT Goal Formulation: With patient Time For Goal Achievement: 03/10/17 Potential to Achieve Goals: Good    Frequency Min 4X/week   Barriers to discharge        Co-evaluation               End of Session Equipment Utilized During Treatment: Gait belt Activity Tolerance: Patient limited by fatigue;Patient limited by lethargy Patient left: in bed;with bed alarm set;with call bell/phone within reach Nurse Communication: Mobility status PT Visit Diagnosis: Muscle weakness (generalized) (M62.81)         Time: 1950-9326 PT Time Calculation (min) (ACUTE ONLY): 13 min   Charges:   PT Evaluation $PT Eval Moderate Complexity: 1 Procedure     PT G CodesLorriane Shire 02/24/2017, 11:16 AM

## 2017-02-25 ENCOUNTER — Encounter (HOSPITAL_COMMUNITY): Payer: Self-pay | Admitting: Emergency Medicine

## 2017-02-25 LAB — RENAL FUNCTION PANEL
Albumin: 3 g/dL — ABNORMAL LOW (ref 3.5–5.0)
Anion gap: 16 — ABNORMAL HIGH (ref 5–15)
BUN: 78 mg/dL — AB (ref 6–20)
CHLORIDE: 91 mmol/L — AB (ref 101–111)
CO2: 26 mmol/L (ref 22–32)
Calcium: 9.5 mg/dL (ref 8.9–10.3)
Creatinine, Ser: 8.67 mg/dL — ABNORMAL HIGH (ref 0.44–1.00)
GFR calc Af Amer: 5 mL/min — ABNORMAL LOW (ref >60)
GFR, EST NON AFRICAN AMERICAN: 4 mL/min — AB (ref >60)
Glucose, Bld: 79 mg/dL (ref 65–99)
POTASSIUM: 5.3 mmol/L — AB (ref 3.5–5.1)
Phosphorus: 8.5 mg/dL — ABNORMAL HIGH (ref 2.5–4.6)
Sodium: 133 mmol/L — ABNORMAL LOW (ref 135–145)

## 2017-02-25 LAB — VAS US CAROTID
LCCADDIAS: 7 cm/s
LCCADSYS: 68 cm/s
LCCAPDIAS: 5 cm/s
LEFT ECA DIAS: -5 cm/s
LEFT VERTEBRAL DIAS: -1 cm/s
LICADSYS: -58 cm/s
LICAPDIAS: -10 cm/s
LICAPSYS: -51 cm/s
Left CCA prox sys: 71 cm/s
Left ICA dist dias: -12 cm/s
RCCAPDIAS: 9 cm/s
RCCAPSYS: 68 cm/s
RIGHT ECA DIAS: -1 cm/s
RIGHT VERTEBRAL DIAS: -5 cm/s
Right cca dist sys: -60 cm/s

## 2017-02-25 LAB — CBC
HEMATOCRIT: 33.4 % — AB (ref 36.0–46.0)
Hemoglobin: 10.6 g/dL — ABNORMAL LOW (ref 12.0–15.0)
MCH: 28.7 pg (ref 26.0–34.0)
MCHC: 31.7 g/dL (ref 30.0–36.0)
MCV: 90.5 fL (ref 78.0–100.0)
Platelets: 166 10*3/uL (ref 150–400)
RBC: 3.69 MIL/uL — ABNORMAL LOW (ref 3.87–5.11)
RDW: 18.7 % — AB (ref 11.5–15.5)
WBC: 6.1 10*3/uL (ref 4.0–10.5)

## 2017-02-25 MED ORDER — SODIUM CHLORIDE 0.9 % IV SOLN: 100.0000 mL | INTRAVENOUS | Status: DC | PRN

## 2017-02-25 MED ORDER — PENTAFLUOROPROP-TETRAFLUOROETH EX AERO: 1.0000 "application " | INHALATION_SPRAY | CUTANEOUS | Status: DC | PRN

## 2017-02-25 MED ORDER — LIDOCAINE-PRILOCAINE 2.5-2.5 % EX CREA: 1.0000 "application " | TOPICAL_CREAM | CUTANEOUS | Status: DC | PRN

## 2017-02-25 MED ORDER — ALTEPLASE 2 MG IJ SOLR: 2.0000 mg | Freq: Once | INTRAMUSCULAR | Status: DC | PRN

## 2017-02-25 MED ORDER — HEPARIN SODIUM (PORCINE) 1000 UNIT/ML DIALYSIS: 1000.0000 [IU] | INTRAMUSCULAR | Status: DC | PRN

## 2017-02-25 MED ORDER — DIPHENHYDRAMINE HCL 12.5 MG/5ML PO ELIX
12.5000 mg | ORAL_SOLUTION | Freq: Once | ORAL | Status: AC
  Administered 2017-02-25: 12.5 mg via ORAL
  Filled 2017-02-25: qty 10

## 2017-02-25 MED ORDER — LIDOCAINE HCL (PF) 1 % IJ SOLN: 5.0000 mL | INTRAMUSCULAR | Status: DC | PRN

## 2017-02-25 MED ORDER — HEPARIN SODIUM (PORCINE) 1000 UNIT/ML DIALYSIS: 3000.0000 [IU] | Freq: Once | INTRAMUSCULAR | Status: DC

## 2017-02-25 NOTE — Progress Notes (Signed)
Patient complains of itching after dialysis. She states that this is normal for her. MD notified...Marland KitchenMarland Kitchen

## 2017-02-25 NOTE — Procedures (Signed)
I was present at this dialysis session. I have reviewed the session itself and made appropriate changes.  No changes and tolerating HD well.  Per Neuro her AMS may be related to episodes of hypotension during HD.  BP was markedly elevated pre-HD 175/97 and has dropped to 121/67.  Will try to keep SBP >110.    Filed Weights   02/23/17 1521 02/23/17 2300 02/25/17 0653  Weight: 55.1 kg (121 lb 7.6 oz) 54.3 kg (119 lb 11.2 oz) 56.3 kg (124 lb 1.9 oz)     Recent Labs Lab 02/25/17 0716  NA 133*  K 5.3*  CL 91*  CO2 26  GLUCOSE 79  BUN 78*  CREATININE 8.67*  CALCIUM 9.5  PHOS 8.5*     Recent Labs Lab 02/23/17 1538 02/23/17 1542 02/24/17 0155 02/25/17 0714  WBC 5.2  --  5.8 6.1  NEUTROABS 3.1  --   --   --   HGB 11.2* 12.6 11.2* 10.6*  HCT 35.7* 37.0 36.4 33.4*  MCV 91.3  --  91.5 90.5  PLT 177  --  180 166    Scheduled Meds: . allopurinol  150 mg Oral QHS  . [START ON 02/26/2017] calcitRIOL  2.25 mcg Oral Q M,W,F-HD  . cinacalcet  30 mg Oral Q M,W,F-HD  . clopidogrel  75 mg Oral Daily  . doxazosin  8 mg Oral QHS  . feeding supplement (NEPRO CARB STEADY)  237 mL Oral BID BM  . ferric citrate  420 mg Oral TID WC  . heparin  3,000 Units Dialysis Once in dialysis  . labetalol  200 mg Oral BID  . losartan  100 mg Oral QHS  . multivitamin  1 tablet Oral Daily  . simvastatin  20 mg Oral QHS   Continuous Infusions: PRN Meds:.sodium chloride, sodium chloride, acetaminophen **OR** acetaminophen (TYLENOL) oral liquid 160 mg/5 mL **OR** acetaminophen, ALPRAZolam, alteplase, heparin, lactulose, lidocaine (PF), lidocaine-prilocaine, meclizine, pentafluoroprop-tetrafluoroeth, senna-docusate, zolpidem   Donetta Potts,  MD 02/25/2017, 8:27 AM

## 2017-02-25 NOTE — Progress Notes (Signed)
PT Cancellation Note  Patient Details Name: [name] MRN: [id number] DOB: [birth year]   Cancelled Treatment:    Reason Eval/Treat Not Completed: Fatigue/lethargy limiting ability to participate. Pt sitting up in chair upon PT arrival. Declined working with therapy stating she is too tired from HD. Offered to return later this afternoon but pt declined. Will check back tomorrow as schedule allows.    Thelma Comp 02/25/2017, 12:07 PM   Rolinda Roan, PT, DPT Acute Rehabilitation Services Pager: [phone]

## 2017-02-25 NOTE — Progress Notes (Signed)
Patient back to floor from dialysis.

## 2017-02-25 NOTE — Progress Notes (Signed)
PROGRESS NOTE    [name]  YJE:[id number] DOB: 04/27/1951 DOA: 02/23/2017 PCP: Kandice Hams, MD   Brief Narrative:  66 y.o. 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   Assessment & Plan:   Principal Problem:   CVA (cerebral vascular accident) [facility]) -Neurology on board and patient currently undergoing stroke work up - Per my discussion with neurologist patient may be undergoing these problems due to low blood pressures during dialysis. 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  DVT prophylaxis: SCD's Code Status: Full Family Communication: none at bedside Disposition Plan: Once work up complete d/c. Most likely next am.   Consultants:   Neurology  Nephrology   Procedures: HD   Antimicrobials: None   Subjective: Patient has no new complaints.  Objective: Vitals:   02/25/17 0930 02/25/17 1000 02/25/17 1038 02/25/17 1436  BP: 106/60 114/62 138/72 (!) 153/79  Pulse: 78 70 75 68  Resp: 14 18 18 17   Temp:   98 F (36.7 C) 98.2 F (36.8 C)  TempSrc:   Oral Oral  SpO2:   95% 96%  Weight:   55.3 kg (121 lb 14.6 oz)   Height:        Intake/Output Summary (Last 24 hours) at 02/25/17 1730 Last data filed at 02/25/17 1038  Gross per 24 hour  Intake                0 ml  Output             1600 ml  Net            -1600 ml   Filed Weights   02/23/17 2300 02/25/17 0653 02/25/17 1038  Weight: 54.3 kg (119 lb 11.2 oz) 56.3 kg (124 lb 1.9 oz) 55.3 kg (121 lb  14.6 oz)    Examination:  General exam: Appears calm and comfortable, in nad. Respiratory system: Clear to auscultation. Respiratory effort normal. Cardiovascular system: S1 & S2 heard, RRR.  Gastrointestinal system: Abdomen is nondistended, soft and nontender. No organomegaly or masses felt. Normal bowel sounds heard. Central nervous system: Alert and oriented. No facial asymmetry Extremities: Symmetric 5 x 5 power. Skin: No rashes, lesions or ulcers Psychiatry: Judgement and insight appear normal. Mood & affect appropriate.   Data Reviewed: I have personally reviewed following labs and imaging studies  CBC:  Recent Labs Lab 02/23/17 1538 02/23/17 1542 02/24/17 0155 02/25/17 0714  WBC 5.2  --  5.8 6.1  NEUTROABS 3.1  --   --   --   HGB 11.2* 12.6 11.2* 10.6*  HCT 35.7* 37.0 36.4 33.4*  MCV 91.3  --  91.5 90.5  PLT 177  --  180 637   Basic Metabolic Panel:  Recent Labs Lab 02/23/17 1538 02/23/17 1542 02/24/17 0155 02/25/17 0716  NA 139 137 136 133*  K 3.4* 3.4* 4.2 5.3*  CL 93* 94* 93* 91*  CO2 31  --  30 26  GLUCOSE 140* 137* 90 79  BUN 36* 37*  47* 78*  CREATININE 5.18* 5.30* 6.42* 8.67*  CALCIUM 8.7*  --  9.1 9.5  MG  --   --  2.3  --   PHOS  --   --  6.9* 8.5*   GFR: Estimated Creatinine Clearance: 5 mL/min (A) (by C-G formula based on SCr of 8.67 mg/dL (H)). Liver Function Tests:  Recent Labs Lab 02/23/17 1538 02/24/17 0155 02/25/17 0716  AST 26  --   --   ALT 11*  --   --   ALKPHOS 87  --   --   BILITOT 0.8  --   --   PROT 7.3  --   --   ALBUMIN 3.4* 3.2* 3.0*   No results for input(s): LIPASE, AMYLASE in the last 168 hours.  Recent Labs Lab 02/24/17 0155  AMMONIA 30   Coagulation Profile:  Recent Labs Lab 02/23/17 1538  INR 1.10   Cardiac Enzymes: No results for input(s): CKTOTAL, CKMB, CKMBINDEX, TROPONINI in the last 168 hours. BNP (last 3 results) No results for input(s): PROBNP in the last 8760 hours. HbA1C:  Recent  Labs  02/24/17 0155  HGBA1C 4.6*   CBG:  Recent Labs Lab 02/23/17 1554  GLUCAP 111*   Lipid Profile:  Recent Labs  02/24/17 0155  CHOL 171  HDL 52  LDLCALC 101*  TRIG 92  CHOLHDL 3.3   Thyroid Function Tests:  Recent Labs  02/24/17 0155  TSH 2.828   Anemia Panel: No results for input(s): VITAMINB12, FOLATE, FERRITIN, TIBC, IRON, RETICCTPCT in the last 72 hours. Sepsis Labs: No results for input(s): PROCALCITON, LATICACIDVEN in the last 168 hours.  No results found for this or any previous visit (from the past 240 hour(s)).       Radiology Studies: Dg Chest 2 View  Result Date: 02/23/2017 CLINICAL DATA:  Altered mental status EXAM: CHEST  2 VIEW COMPARISON:  Chest radiograph 02/04/2017 FINDINGS: Right IJ hemodialysis catheter tip overlies the lower SVC, in unchanged position. There is unchanged cardiomegaly with calcific aortic atherosclerosis. No pleural effusion or pneumothorax. No focal airspace consolidation or pulmonary edema. IMPRESSION: Cardiomegaly and calcific aortic atherosclerosis without pulmonary edema. Electronically Signed   By: Ulyses Jarred M.D.   On: 02/23/2017 22:32   Mr Jodene Nam Head Wo Contrast  Result Date: 02/24/2017 CLINICAL DATA:  CVA. EXAM: MRA HEAD WITHOUT CONTRAST TECHNIQUE: Angiographic images of the Circle of Willis were obtained using MRA technique without intravenous contrast. COMPARISON:  05/09/2015 FINDINGS: Symmetric carotid arteries. There is poor signal in the mid and distal right M1 segment with normalized appearance of M2 and distal vessels. This appearance is likely artifactual related to direction of flow. No focal narrowing is noted on the source images. No stenosis or branch occlusion noted in the ACA distribution to correlate with the acute infarct. Right dominant vertebrobasilar system. Much of the left vertebral flows to the PICA. Major posterior circulation vessels are smooth and widely patent. Mild narrowing along the proximal  right PICA. Negative for aneurysm. IMPRESSION: Poor signal in the distal right M1 segment is favored artifactual rather than stenotic. As such, no convincing change compared to 2016. No findings in the [place] distribution to correlate with the acute infarct. Electronically Signed   By: Monte Fantasia M.D.   On: 02/24/2017 10:12   Mr Brain Wo Contrast  Result Date: 02/23/2017 CLINICAL DATA:  66 year old female with confusion, generalize weakness, recently missed hemodialysis. Initial encounter. EXAM: MRI HEAD WITHOUT CONTRAST TECHNIQUE: Multiplanar, multiecho pulse sequences of the brain  and surrounding structures were obtained without intravenous contrast. COMPARISON:  Head CT 1531 hours today. Brain MRI 06/24/2016 and earlier. FINDINGS: Brain: There is a small 9 mm focus of restricted diffusion along the right body of the corpus callosum best seen on series 3, image 30. There is subtle associated T2 and FLAIR hyperintensity (series 12, image 14) with no significant mass effect. Punctate petechial hemorrhaged possible (series 9, image 62). No other restricted diffusion. Advanced chronic small vessel disease with numerous chronic micro hemorrhages (maximal in the deep gray matter, brainstem, and deep gray matter nuclei), and multiple chronic lacunar infarcts in the deep gray matter nuclei and brainstem - including a prominent central pontine lacune which is chronic but new since July 2017 (series 6, image 8). Associated chronic bilateral cerebral white matter T2 and FLAIR hyperintensity, including deep white matter capsule involvement, has not significantly changed. No cortical encephalomalacia identified. No midline shift, mass effect, evidence of mass lesion, ventriculomegaly, or extra-axial collection. Cervicomedullary junction and pituitary are within normal limits. Vascular: Major intracranial vascular flow voids appear stable since 2017 including dominant and dolichoectatic distal right vertebral artery.  Skull and upper cervical spine: Stable. Sinuses/Orbits: Stable and negative. Other: Mastoid air cells are clear. Visible internal auditory structures appear normal. Negative scalp soft tissues. IMPRESSION: 1. 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 Petechial hemorrhage but no associated mass effect. 2. No other acute intracranial abnormality. Extensive chronic small vessel disease with some progression elsewhere since July 2017. Electronically Signed   By: Genevie Ann M.D.   On: 02/23/2017 18:55        Scheduled Meds: . allopurinol  150 mg Oral QHS  . [START ON 02/26/2017] calcitRIOL  2.25 mcg Oral Q M,W,F-HD  . cinacalcet  30 mg Oral Q M,W,F-HD  . clopidogrel  75 mg Oral Daily  . doxazosin  8 mg Oral QHS  . feeding supplement (NEPRO CARB STEADY)  237 mL Oral BID BM  . ferric citrate  420 mg Oral TID WC  . labetalol  200 mg Oral BID  . losartan  100 mg Oral QHS  . multivitamin  1 tablet Oral Daily  . simvastatin  20 mg Oral QHS   Continuous Infusions:   LOS: 2 days    Time spent: > 35 minutes  Velvet Bathe, MD [facility] Pager [phone]  If 7PM-7AM, please contact night-coverage [email or web address] Password [email or web address] 02/25/2017, 5:30 PM

## 2017-02-26 ENCOUNTER — Encounter: Payer: Medicare Other | Admitting: Vascular Surgery

## 2017-02-26 LAB — CBC
HEMATOCRIT: 33.3 % — AB (ref 36.0–46.0)
HEMOGLOBIN: 10.4 g/dL — AB (ref 12.0–15.0)
MCH: 28.1 pg (ref 26.0–34.0)
MCHC: 31.2 g/dL (ref 30.0–36.0)
MCV: 90 fL (ref 78.0–100.0)
Platelets: 163 10*3/uL (ref 150–400)
RBC: 3.7 MIL/uL — ABNORMAL LOW (ref 3.87–5.11)
RDW: 18 % — AB (ref 11.5–15.5)
WBC: 5.4 10*3/uL (ref 4.0–10.5)

## 2017-02-26 LAB — RENAL FUNCTION PANEL
ANION GAP: 15 (ref 5–15)
Albumin: 2.9 g/dL — ABNORMAL LOW (ref 3.5–5.0)
BUN: 43 mg/dL — AB (ref 6–20)
CO2: 25 mmol/L (ref 22–32)
Calcium: 9.3 mg/dL (ref 8.9–10.3)
Chloride: 93 mmol/L — ABNORMAL LOW (ref 101–111)
Creatinine, Ser: 5.53 mg/dL — ABNORMAL HIGH (ref 0.44–1.00)
GFR calc Af Amer: 8 mL/min — ABNORMAL LOW (ref >60)
GFR calc non Af Amer: 7 mL/min — ABNORMAL LOW (ref >60)
GLUCOSE: 102 mg/dL — AB (ref 65–99)
POTASSIUM: 4.7 mmol/L (ref 3.5–5.1)
Phosphorus: 6.6 mg/dL — ABNORMAL HIGH (ref 2.5–4.6)
Sodium: 133 mmol/L — ABNORMAL LOW (ref 135–145)

## 2017-02-26 MED ORDER — SIMVASTATIN 20 MG PO TABS: 20.0000 mg | ORAL_TABLET | Freq: Every day | ORAL | 0 refills | Status: DC

## 2017-02-26 MED ORDER — CLOPIDOGREL BISULFATE 75 MG PO TABS: 75.0000 mg | ORAL_TABLET | Freq: Every day | ORAL | 0 refills | Status: DC

## 2017-02-26 NOTE — Progress Notes (Signed)
Report called in to dialysis nurse. Pt with no noted distress. BP meds held per predialysis order. Pt denies pain or discomfort. Will continue to monitor.

## 2017-02-26 NOTE — Care Management Note (Signed)
Case Management Note  Patient Details  Name: [name] MRN: [id number] Date of Birth: [birth year]  Subjective/Objective:                    Action/Plan: Pt discharging home with self care. No f/u per PT/OT. Pt has insurance, PCP and transportation home. No further needs per CM.   Expected Discharge Date:  02/26/17               Expected Discharge Plan:  Home/Self Care  In-House Referral:     Discharge planning Services     Post Acute Care Choice:    Choice offered to:     DME Arranged:    DME Agency:     HH Arranged:    HH Agency:     Status of Service:  Completed, signed off  If discussed at [employer] of Stay Meetings, dates discussed:    Additional Comments:  Pollie Friar, RN 02/26/2017, 5:10 PM

## 2017-02-26 NOTE — Progress Notes (Signed)
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 Patient notes that she often misplacing bills or forgetting to pay them on time. Associated symptoms include difficulty with balance, unsteady gait, right-sided headaches, fatigue, and intermittent palpitations. In the last few weeks she has fallen after tripped over the dog and reports running into things after losing her balance in her home. MRI of the brain which revealed acute on chronic white matter infarct on the body of the right corpus callosum.   OT comments  Pt making progress towards goals. PT WILL REQUIRE HHOT upon dc to ensure safety and continue education in energy conservation, fall prevention, and home set up eval. Pt min guard for ADL this session, but displaying some impulsivity and odd requests (see notes below). Session focused on tub transfer safety, sink level ADL, safety with RW, energy conservation and fall prevention. Pt continues to benefit from skilled OT in the acute setting and will require HHOT upon dc.  Follow Up Recommendations  Supervision - Intermittent;Home health OT    Equipment Recommendations  Tub/shower seat (HAS to be able to fit in tub as Pt has DOORS)    Recommendations for Other Services      Precautions / Restrictions Precautions Precautions: Fall Restrictions Weight Bearing Restrictions: No       Mobility Bed Mobility Overal bed mobility: Modified Independent             General bed mobility comments: Pt sitting OOB in recliner upon arrival  Transfers Overall transfer level: Needs assistance Equipment used: Rolling walker (2 wheeled) Transfers: Sit to/from Stand Sit to Stand: Min  guard Stand pivot transfers: Min guard       General transfer comment: min guard for safety, moving faster today seemingly a little impulsive - but Pt making comments during session that she is ready to be home    Balance Overall balance assessment: Needs assistance Sitting-balance support: No upper extremity supported;Feet supported Sitting balance-Leahy Scale: Good     Standing balance support: During functional activity;No upper extremity supported Standing balance-Leahy Scale: Fair Standing balance comment: leans against sink for balance to use both hands                   ADL Overall ADL's : Needs assistance/impaired     Grooming: Wash/dry hands;Wash/dry face;Oral care;Min guard;Standing Grooming Details (indicate cue type and reason): sink level, using hips against sink for balance, and using RW for balance. Pt with decreased safety awareness trying to walk away from walker     Lower Body Bathing: Min guard;Sit to/from stand Lower Body Bathing Details (indicate cue type and reason): Able to perform peri care standing at sink                 Tub/ Shower Transfer: Min guard;Tub transfer;Cueing for sequencing;Shower seat;Ambulation;Rolling walker Tub/Shower Transfer Details (indicate cue type and reason): Talked extensively with Pt about this, she has been "crawling" into her tub with a towel over the threshold. Her tub has doors and she REALLY needs a shower chair to be safe and would benefit from handrails to assist with the transfer. Educated to use the wall for stability. Functional mobility during ADLs: Min guard;Rolling walker;Cueing for safety General  ADL Comments: Pt overall at min guard level for sink level ADL and functional mobility within room with RW. vc for safety with RW.      Vision                     Perception     Praxis      Cognition   Behavior During Therapy: [facility] for tasks assessed/performed;Impulsive (Pt with decreased safety  awareness) Overall Cognitive Status: Within Functional Limits for tasks assessed                  General Comments: Pt WFL, but also with some mild confusion and odd requests (wanted paper towels in case she had to go to the bathroom)      Exercises     Shoulder Instructions       General Comments      Pertinent Vitals/ Pain       Pain Assessment: No/denies pain  Home Living                                          Prior Functioning/Environment              Frequency  Min 2X/week        Progress Toward Goals  OT Goals(current goals can now be found in the care plan section)  Progress towards OT goals: Progressing toward goals  Acute Rehab OT Goals Patient Stated Goal: to get home OT Goal Formulation: With patient Time For Goal Achievement: 03/10/17 Potential to Achieve Goals: Good  Plan Discharge plan needs to be updated    Co-evaluation                 End of Session Equipment Utilized During Treatment: Gait belt;Rolling walker  OT Visit Diagnosis: Unsteadiness on feet (R26.81);Muscle weakness (generalized) (M62.81)   Activity Tolerance Patient tolerated treatment well   Patient Left in chair;with call bell/phone within reach;with chair alarm set   Nurse Communication Mobility status (chair alarm on)        Time: 1003-1016 OT Time Calculation (min): 13 min  Charges: OT General Charges $OT Visit: 1 Procedure OT Treatments $Self Care/Home Management : 8-22 mins  Hulda Humphrey OTR/L [place] 02/26/2017, 11:21 AM

## 2017-02-26 NOTE — Clinical Social Work Note (Signed)
CSW consulted for "Patient having difficulty with executive functioning skills including keeping up with and paying bills question possible need of placement." CSW and RNCM met with pt upon return from dialysis. Sister-Valerie Feaster and [employer] at bedside. Pt agreed to have sister and mother remain in room during discussion. Pt stated she was ready to return home. P/T and O/T not recommending any follow up. CSW provided pt with resources for rep payee services. Pt was appreciative of resources and stated "that's exactly what I needed." Sister and mother to transport pt home. CSW signing off as no further SW needs identified.   Oretha Ellis, [place], [place] Work [phone]

## 2017-02-26 NOTE — Discharge Summary (Signed)
Physician Discharge Summary  [name] VQQ:[id number] DOB: 1951/09/25 DOA: 02/23/2017  PCP: Kandice Hams, MD  Admit date: 02/23/2017 Discharge date: 02/26/2017  Time spent: > 35 minutes  Recommendations for Outpatient Follow-up:  1. Ensure patient gets holter monitor for further monitoring. Please see neuro notes below.   Discharge Diagnoses:  Principal Problem:   Acute ischemic stroke [facility]) Active Problems:   Anemia in CKD (chronic kidney disease)   HTN (hypertension)   Hyperlipidemia   ESRD (end stage renal disease) on dialysis (HCC)   Prolonged Q-T interval on ECG   Hypokalemia   Discharge Condition: stable  Diet recommendation: renal   Filed Weights   02/25/17 0653 02/25/17 1038 02/26/17 1327  Weight: 56.3 kg (124 lb 1.9 oz) 55.3 kg (121 lb 14.6 oz) 56.4 kg (124 lb 5.4 oz)    History of present illness:  66 y.o.femalewith medical history significant of ESRD on HD(M/W/F),CHF, HTN, TIA/CVA, and anemia of chronic disease; who presents after being found to be acutely altered during hemodialysis.  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 Infarct most likely due to secondary to small vessel disease source especially in the setting of possible cerebral hypoperfusion s/p HD. However, pt does admit occasional short lasting heart palpitation, therefore 30 day cardiac event monitoring will be recommended to perform as outpt to rule out afib.  Resultant  No significant neuro deficit  Code stroke CT. No acute abnormality. small vessel disease. 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  Extensive small vessel disease. Old pontine, and b/l thalamic infarcts.  MRA  Poor signal distal R M1 (artifactual). No significant findings  Carotid Doppler  unremarkable  2D Echo  EF 50-55%  Recommend 30 day cardiac  event monitoring as outpt to rule out afib  LDL 101  HgbA1c pending  SCDs for VTE prophylaxis  Diet Heart Room service appropriate? Yes; Fluid consistency: Thin  aspirin 325 mg daily prior to admission, now on aspirin 325 mg daily. Recommend to switch to plavix for further stroke prevention.   Patient counseled to be compliant with her antithrombotic medications  Ongoing aggressive stroke risk factor management  Therapy recommendations:  NO PT  HPL - cotninue zocor 20 mg po daily on d/c  ESRD - pt to continue routine HD sessions  Procedures:  Dialysis  Stroke w/u refer to neuro notes  Consultations:  Neurology  Nephrology  Discharge Exam: Vitals:   02/26/17 1339 02/26/17 1400  BP: (!) 154/87 (!) 157/81  Pulse: 71 72  Resp:    Temp:      General: Pt in nad, alert and awake Cardiovascular: rrr, no rubs Respiratory: no increased wob, no wheezes  Discharge Instructions   Discharge Instructions    Ambulatory referral to Neurology    Complete by:  As directed    Follow up with NP Cecille Rubin at [facility] in about 2 months. Thanks.   Call MD for:  extreme fatigue    Complete by:  As directed    Call MD for:  persistant nausea and vomiting    Complete by:  As directed    Diet - low sodium heart healthy    Complete by:  As directed    Discharge instructions    Complete by:  As directed    Please see your primary care physician within the next 1 week. Have him  set up holter monitoring for further evaluation and recommendations.   Increase activity slowly    Complete by:  As directed      Current Discharge Medication List    START taking these medications   Details  clopidogrel (PLAVIX) 75 MG tablet Take 1 tablet (75 mg total) by mouth daily. Qty: 30 tablet, Refills: 0      CONTINUE these medications which have CHANGED   Details  simvastatin (ZOCOR) 20 MG tablet Take 1 tablet (20 mg total) by mouth at bedtime. Qty: 30 tablet, Refills: 0       CONTINUE these medications which have NOT CHANGED   Details  acetaminophen (TYLENOL) 325 MG tablet Take 650 mg by mouth every 6 (six) hours as needed for mild pain or headache.     allopurinol (ZYLOPRIM) 300 MG tablet Take 150 mg by mouth at bedtime.    ALPRAZolam (XANAX) 0.25 MG tablet Take 0.25 mg by mouth 2 (two) times daily as needed for anxiety.     AURYXIA 1 GM 210 MG(Fe) tablet Take 420 mg by mouth 3 (three) times daily with meals.     B Complex-C-Folic Acid (DIALYVITE PO) Take 1 tablet by mouth daily.    doxazosin (CARDURA) 8 MG tablet Take 1 tablet (8 mg total) by mouth at bedtime.    labetalol (NORMODYNE) 200 MG tablet Take 200 mg by mouth 2 (two) times daily.     lactulose (CHRONULAC) 10 GM/15ML solution Take 20 g by mouth daily as needed for mild constipation.    losartan (COZAAR) 100 MG tablet Take 100 mg by mouth at bedtime.     meclizine (ANTIVERT) 25 MG tablet Take 1 tablet (25 mg total) by mouth 2 (two) times daily as needed for dizziness. Qty: 30 tablet, Refills: 0    SENSIPAR 30 MG tablet Take 30 mg by mouth every Monday, Wednesday, and Friday with hemodialysis.       STOP taking these medications     aspirin EC 325 MG tablet      promethazine (PHENERGAN) 25 MG tablet      zolpidem (AMBIEN) 10 MG tablet      acetaminophen-codeine (TYLENOL #3) 300-30 MG tablet        Allergies  Allergen Reactions  . Sulfa Antibiotics Other (See Comments)    Both parents allergic-so will not take  . Adhesive [Tape] Itching   Follow-up Information    Dennie Bible, NP. Schedule an appointment as soon as possible for a visit in 6 week(s).   Specialty:  Family Medicine Contact information: [address] [place] [address] [phone]            The results of significant diagnostics from this hospitalization (including imaging, microbiology, ancillary and laboratory) are listed below for reference.    Significant Diagnostic  Studies: Dg Chest 2 View  Result Date: 02/23/2017 CLINICAL DATA:  Altered mental status EXAM: CHEST  2 VIEW COMPARISON:  Chest radiograph 02/04/2017 FINDINGS: Right IJ hemodialysis catheter tip overlies the lower SVC, in unchanged position. There is unchanged cardiomegaly with calcific aortic atherosclerosis. No pleural effusion or pneumothorax. No focal airspace consolidation or pulmonary edema. IMPRESSION: Cardiomegaly and calcific aortic atherosclerosis without pulmonary edema. Electronically Signed   By: Ulyses Jarred M.D.   On: 02/23/2017 22:32   Mr Jodene Nam Head Wo Contrast  Result Date: 02/24/2017 CLINICAL DATA:  CVA. EXAM: MRA HEAD WITHOUT CONTRAST TECHNIQUE: Angiographic images of the Circle of Willis were obtained using MRA technique without intravenous  contrast. COMPARISON:  05/09/2015 FINDINGS: Symmetric carotid arteries. There is poor signal in the mid and distal right M1 segment with normalized appearance of M2 and distal vessels. This appearance is likely artifactual related to direction of flow. No focal narrowing is noted on the source images. No stenosis or branch occlusion noted in the ACA distribution to correlate with the acute infarct. Right dominant vertebrobasilar system. Much of the left vertebral flows to the PICA. Major posterior circulation vessels are smooth and widely patent. Mild narrowing along the proximal right PICA. Negative for aneurysm. IMPRESSION: Poor signal in the distal right M1 segment is favored artifactual rather than stenotic. As such, no convincing change compared to 2016. No findings in the [place] distribution to correlate with the acute infarct. Electronically Signed   By: Monte Fantasia M.D.   On: 02/24/2017 10:12   Mr Brain Wo Contrast  Result Date: 02/23/2017 CLINICAL DATA:  66 year old female with confusion, generalize weakness, recently missed hemodialysis. Initial encounter. EXAM: MRI HEAD WITHOUT CONTRAST TECHNIQUE: Multiplanar, multiecho pulse sequences of  the brain and surrounding structures were obtained without intravenous contrast. COMPARISON:  Head CT 1531 hours today. Brain MRI 06/24/2016 and earlier. FINDINGS: Brain: There is a small 9 mm focus of restricted diffusion along the right body of the corpus callosum best seen on series 3, image 30. There is subtle associated T2 and FLAIR hyperintensity (series 12, image 14) with no significant mass effect. Punctate petechial hemorrhaged possible (series 9, image 62). No other restricted diffusion. Advanced chronic small vessel disease with numerous chronic micro hemorrhages (maximal in the deep gray matter, brainstem, and deep gray matter nuclei), and multiple chronic lacunar infarcts in the deep gray matter nuclei and brainstem - including a prominent central pontine lacune which is chronic but new since July 2017 (series 6, image 8). Associated chronic bilateral cerebral white matter T2 and FLAIR hyperintensity, including deep white matter capsule involvement, has not significantly changed. No cortical encephalomalacia identified. No midline shift, mass effect, evidence of mass lesion, ventriculomegaly, or extra-axial collection. Cervicomedullary junction and pituitary are within normal limits. Vascular: Major intracranial vascular flow voids appear stable since 2017 including dominant and dolichoectatic distal right vertebral artery. Skull and upper cervical spine: Stable. Sinuses/Orbits: Stable and negative. Other: Mastoid air cells are clear. Visible internal auditory structures appear normal. Negative scalp soft tissues. IMPRESSION: 1. 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 Petechial hemorrhage but no associated mass effect. 2. No other acute intracranial abnormality. Extensive chronic small vessel disease with some progression elsewhere since July 2017. Electronically Signed   By: Genevie Ann M.D.   On: 02/23/2017 18:55   Dg Chest Port 1 View  Result Date:  02/04/2017 CLINICAL DATA:  Status post dialysis catheter insertion. EXAM: PORTABLE CHEST 1 VIEW COMPARISON:  12/24/2016 FINDINGS: Right internal jugular approach dual lumen central venous catheter terminates in the expected location of distal superior vena cava. The cardiac silhouette is enlarged. Mediastinal contours appear intact. Calcific atherosclerotic disease and tortuosity of the aorta. There is no evidence of focal airspace consolidation, pleural effusion or pneumothorax. Mild pulmonary vascular congestion without evidence of frank edema. Osseous structures are without acute abnormality. Soft tissues are grossly normal. IMPRESSION: Mild pulmonary vascular congestion without evidence of frank pulmonary edema. Enlarged cardiac silhouette. Dual lumen central venous catheter in satisfactory radiographic position. Electronically Signed   By: Fidela Salisbury M.D.   On: 02/04/2017 16:12   Dg Fluoro Guide Cv Line-no Report  Result Date: 02/04/2017 Fluoroscopy was utilized by the requesting physician.  No radiographic interpretation.   Ct Head Code Stroke W/o Cm  Result Date: 02/23/2017 CLINICAL DATA:  Code stroke. 66 year old female with dizziness lethargy, generalized weakness, altered mental status. EXAM: CT HEAD WITHOUT CONTRAST TECHNIQUE: Contiguous axial images were obtained from the base of the skull through the vertex without intravenous contrast. COMPARISON:  Brain MRI 06/24/2016.  Head CT 06/11/2016, and earlier. FINDINGS: Brain: No acute intracranial hemorrhage identified. No midline shift, mass effect, or evidence of intracranial mass lesion. No ventriculomegaly. Chronic moderate to severe heterogeneity in the cerebral white matter, deep gray matter nuclei, and brainstem compatible with advanced chronic small vessel disease. No superimposed acute cortically based infarct identified. Vascular: Calcified atherosclerosis at the skull base. No suspicious intracranial vascular hyperdensity. Skull:  No acute osseous abnormality identified. Chronic moderate to severe degenerative changes including suspected ligamentous hypertrophy about the odontoid. Associated widening of the atlanto dens interval. Sinuses/Orbits: Visualized paranasal sinuses and mastoids are stable and well pneumatized. Other: Previous left forehead scalp hematoma. No acute orbit or scalp soft tissue findings. Mild calcified scalp vessel atherosclerosis. ASPECTS [facility] Stroke Program Early CT Score) - Ganglionic level infarction (caudate, lentiform nuclei, internal capsule, insula, M1-M3 cortex): 7 - Supraganglionic infarction (M4-M6 cortex): 3 Total score (0-10 with 10 being normal): 10 IMPRESSION: 1. No acute hemorrhage or acute cortically based infarct identified. Advanced small vessel disease. 2. ASPECTS is 10. 3. The above was relayed via text pager to Dr. Spero Geralds On 02/23/2017 at 15:41 . Electronically Signed   By: Genevie Ann M.D.   On: 02/23/2017 15:42    Microbiology: No results found for this or any previous visit (from the past 240 hour(s)).   Labs: Basic Metabolic Panel:  Recent Labs Lab 02/23/17 1538 02/23/17 1542 02/24/17 0155 02/25/17 0716  NA 139 137 136 133*  K 3.4* 3.4* 4.2 5.3*  CL 93* 94* 93* 91*  CO2 31  --  30 26  GLUCOSE 140* 137* 90 79  BUN 36* 37* 47* 78*  CREATININE 5.18* 5.30* 6.42* 8.67*  CALCIUM 8.7*  --  9.1 9.5  MG  --   --  2.3  --   PHOS  --   --  6.9* 8.5*   Liver Function Tests:  Recent Labs Lab 02/23/17 1538 02/24/17 0155 02/25/17 0716  AST 26  --   --   ALT 11*  --   --   ALKPHOS 87  --   --   BILITOT 0.8  --   --   PROT 7.3  --   --   ALBUMIN 3.4* 3.2* 3.0*   No results for input(s): LIPASE, AMYLASE in the last 168 hours.  Recent Labs Lab 02/24/17 0155  AMMONIA 30   CBC:  Recent Labs Lab 02/23/17 1538 02/23/17 1542 02/24/17 0155 02/25/17 0714 02/26/17 1319  WBC 5.2  --  5.8 6.1 5.4  NEUTROABS 3.1  --   --   --   --   HGB 11.2* 12.6 11.2* 10.6* 10.4*   HCT 35.7* 37.0 36.4 33.4* 33.3*  MCV 91.3  --  91.5 90.5 90.0  PLT 177  --  180 166 163   Cardiac Enzymes: No results for input(s): CKTOTAL, CKMB, CKMBINDEX, TROPONINI in the last 168 hours. BNP: BNP (last 3 results) No results for input(s): BNP in the last 8760 hours.  ProBNP (last 3 results) No results for input(s): PROBNP in the last 8760 hours.  CBG:  Recent  Labs Lab 02/23/17 1554  GLUCAP 111*       Signed:  Velvet Bathe MD.  [facility] 02/26/2017, 2:16 PM

## 2017-02-26 NOTE — Care Management Important Message (Signed)
Important Message  Patient Details  Name: [name] MRN: [id number] Date of Birth: [birth year]   Medicare Important Message Given:  Yes    [name] [name] 02/26/2017, 1:07 PM

## 2017-02-26 NOTE — Progress Notes (Signed)
Physical Therapy Treatment Patient Details Name: [name] MRN: [id number] DOB: [birth year] Today's Date: 02/26/2017    History of Present Illness Pt is a 66 y/o female with medical history significant of ESRD on HD(M/W/F), CHF, HTN, TIA/CVA, and anemia of chronic disease; who presents after being found to be acutely altered during hemodialysis. Patient notes that she often misplacing bills or forgetting to pay them on time. Associated symptoms include difficulty with balance, unsteady gait, right-sided headaches, fatigue, and intermittent palpitations. In the last few weeks she has fallen after tripped over the dog and reports running into things after losing her balance in her home. MRI of the brain which revealed acute on chronic white matter infarct on the body of the right corpus callosum.    PT Comments    Encouragement needed for pt to participate. Agreeable to ambulation in hallway. Steady gait noted. No LOB. Pt reports improvement in generalized weakness. Min guard assist provided for safety.   Follow Up Recommendations  No PT follow up;Supervision - Intermittent     Equipment Recommendations  None recommended by PT    Recommendations for Other Services       Precautions / Restrictions Precautions Precautions: Fall    Mobility  Bed Mobility Overal bed mobility: Modified Independent             General bed mobility comments: +rail, increased time  Transfers   Equipment used: None   Sit to Stand: Min guard Stand pivot transfers: Min guard       General transfer comment: min guard for safety  Ambulation/Gait Ambulation/Gait assistance: Min guard Ambulation Distance (Feet): 200 Feet Assistive device: 1 person hand held assist (to simulate cane) Gait Pattern/deviations: Step-through pattern;Decreased stride length   Gait velocity interpretation: Below normal speed for age/gender General Gait Details: slow, steady gait   Stairs             Wheelchair Mobility    Modified Rankin (Stroke Patients Only) Modified Rankin (Stroke Patients Only) Pre-Morbid Rankin Score: No significant disability Modified Rankin: No significant disability     Balance   Sitting-balance support: No upper extremity supported;Feet supported Sitting balance-Leahy Scale: Good     Standing balance support: During functional activity;No upper extremity supported Standing balance-Leahy Scale: Fair                      Cognition Arousal/Alertness: Awake/alert Behavior During Therapy: WFL for tasks assessed/performed Overall Cognitive Status: Within Functional Limits for tasks assessed                      Exercises      General Comments        Pertinent Vitals/Pain Pain Assessment: No/denies pain    Home Living                      Prior Function            PT Goals (current goals can now be found in the care plan section) Acute Rehab PT Goals Patient Stated Goal: independent PT Goal Formulation: With patient Time For Goal Achievement: 03/10/17 Potential to Achieve Goals: Good Progress towards PT goals: Progressing toward goals    Frequency    Min 4X/week      PT Plan Current plan remains appropriate    Co-evaluation             End of Session Equipment Utilized During Treatment: Gait belt Activity Tolerance:  Patient tolerated treatment well Patient left: in bed;with call bell/phone within reach Nurse Communication: Mobility status PT Visit Diagnosis: Muscle weakness (generalized) (M62.81)     Time: 9037-9558 PT Time Calculation (min) (ACUTE ONLY): 11 min  Charges:  $Gait Training: 8-22 mins                    G Codes:       Lorriane Shire 02/26/2017, 9:15 AM

## 2017-02-26 NOTE — Progress Notes (Signed)
Patient ID: [name], female   DOB: Jul 27, 1951, 66 y.o.   MRN: [id number]  [facility] Progress Note    Subjective:   "I want to go home"   Objective:   BP (!) 154/83 (BP Location: Right Arm)   Pulse 87   Temp 97.7 F (36.5 C) (Oral)   Resp 18   Ht 5\' 2"  (1.575 m)   Wt 55.3 kg (121 lb 14.6 oz)   SpO2 100%   BMI 22.30 kg/m   Intake/Output: I/O last 3 completed shifts: In: -  Out: 1600 [Other:1600]   Intake/Output this shift:  No intake/output data recorded. Weight change: -1 kg (-2 lb 3.3 oz)  Physical Exam: Gen:WD WN AAF in NAD CVS:no rub Resp:cta QQV:ZDGLOV Ext:no edema, LUE AVF +T/B  Labs: BMET  Recent Labs Lab 02/23/17 1538 02/23/17 1542 02/24/17 0155 02/25/17 0716  NA 139 137 136 133*  K 3.4* 3.4* 4.2 5.3*  CL 93* 94* 93* 91*  CO2 31  --  30 26  GLUCOSE 140* 137* 90 79  BUN 36* 37* 47* 78*  CREATININE 5.18* 5.30* 6.42* 8.67*  ALBUMIN 3.4*  --  3.2* 3.0*  CALCIUM 8.7*  --  9.1 9.5  PHOS  --   --  6.9* 8.5*   CBC  Recent Labs Lab 02/23/17 1538 02/23/17 1542 02/24/17 0155 02/25/17 0714  WBC 5.2  --  5.8 6.1  NEUTROABS 3.1  --   --   --   HGB 11.2* 12.6 11.2* 10.6*  HCT 35.7* 37.0 36.4 33.4*  MCV 91.3  --  91.5 90.5  PLT 177  --  180 166    @IMGRELPRIORS @ Medications:    . allopurinol  150 mg Oral QHS  . calcitRIOL  2.25 mcg Oral Q M,W,F-HD  . cinacalcet  30 mg Oral Q M,W,F-HD  . clopidogrel  75 mg Oral Daily  . doxazosin  8 mg Oral QHS  . feeding supplement (NEPRO CARB STEADY)  237 mL Oral BID BM  . ferric citrate  420 mg Oral TID WC  . labetalol  200 mg Oral BID  . losartan  100 mg Oral QHS  . multivitamin  1 tablet Oral Daily  . 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  Acute on chronic CVA -  per primary neuro following. Appears at baseline, no significant deficit.  Agree with d/c to home after HD today. 2.  ESRD -  will get back on schedule to continue with HD q MWF (will shorten treatment today since she had full HD yesterday) 3.  Hypertension/volume  - BP controlled at present/No gross volume excess on exam UF to EDW  4.  Anemia  - Hgb 11.2 follow - on OP esa no indication to redose yet  5.  Metabolic bone disease -  Cont VDRA/ Sensipar Continue Auryxia binder with ^P 6.  Nutrition - Renal diet/vitamins/proetin supp with low alb 7. Disposition- for possible discharge today after HD per primary svc  Donetta Potts, MD [place] Pager [phone] 02/26/2017, 10:07 AM

## 2017-03-01 DIAGNOSIS — D631 Anemia in chronic kidney disease: Secondary | ICD-10-CM | POA: Diagnosis not present

## 2017-03-01 DIAGNOSIS — N186 End stage renal disease: Secondary | ICD-10-CM | POA: Diagnosis not present

## 2017-03-01 DIAGNOSIS — N2581 Secondary hyperparathyroidism of renal origin: Secondary | ICD-10-CM | POA: Diagnosis not present

## 2017-03-01 NOTE — ED Provider Notes (Addendum)
[facility] DEPT Provider Note   CSN: [id number] Arrival date & time: 02/23/17  1459     History   Chief Complaint Chief Complaint  Patient presents with  . Dizziness    HPI [name] is a 66 y.o. female.  HPI  66 y.o. 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 Pt's sister reports that HD center called her and started that pt was not acting her normal self. Pt's sister notes that when she called the patient around 11 AM this morning she didn't sound normal self. Associated symptoms include difficulty with balance, unsteady gait, right-sided headaches, fatigue, and intermittent palpitations. In the last few weeks she has fallen after tripped over the dog and reports running into things after losing her balance in her home.   Past Medical History:  Diagnosis Date  . Adenomatous polyp of colon 10/2010, 2006, 2015  . Anemia in CKD (chronic kidney disease) 11/07/2012   s/p blood transfusion.   . Arthritis   . CHF (congestive heart failure) ([place])   . Constipation   . Depression with anxiety   . Diverticula, colon   . 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  Had deceased donor kidney transplant in 1996.  Had some early rejection then stable function for years, then had slow decline of function and went back on hemodialysis in 2012.  Gets HD TTS schedule at [facility] on [facility] still using L forearm AVF.     Marland Kitchen GERD (gastroesophageal reflux disease)   . Headache   . Hyperlipidemia   . Hypertension   . Neuromuscular disorder (HCC)    neuropathy hand and legs  . Osteoporosis   . Pneumonia   . 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 Stroke [facility]) 11/2015   TIA  . Weight loss, unintentional     Patient Active Problem  List   Diagnosis Date Noted  . Prolonged Q-T interval on ECG 02/24/2017  . Hypokalemia 02/24/2017  . Acute ischemic stroke ([place]) 02/23/2017  . Malnutrition of moderate degree 12/24/2016  . Problem with dialysis access ([place]) 12/21/2016  . Acute ischemic colitis ([place]) 05/16/2016  . Colitis 05/15/2016  . Rectal bleeding 05/15/2016  . Neurologic abnormality 11/19/2015  . Altered mental state 11/19/2015  . Hyperkalemia 11/19/2015  . Anxiety 11/19/2015  . Insomnia 11/19/2015  . ESRD (end stage renal disease) on dialysis ([place])   . Gait instability   . Stroke-like symptom 11/16/2015  . Vestibular neuritis 11/16/2015  . Stroke (cerebrum) ([place]) 11/16/2015  . Dizziness 05/09/2015  . Ataxia 05/09/2015  . H/O: CVA (cerebrovascular accident) 05/09/2015  . Left facial numbness 05/09/2015  . Left leg numbness 05/09/2015  . Hyperlipidemia   . CHF (congestive heart failure) ([place])   . Congestive heart disease ([place])   . Shortness of breath 04/01/2013  . Volume overload 04/01/2013  . HTN (hypertension) 04/01/2013  . Cholecystitis, acute 11/07/2012  . ESRD (end stage renal disease) ([place]) 11/07/2012  . GERD (gastroesophageal reflux disease) 11/07/2012  . Anemia in CKD (chronic kidney disease) 11/07/2012    Past Surgical History:  Procedure Laterality Date  . AV FISTULA PLACEMENT     for dialysis  . AV FISTULA PLACEMENT Left 11/22/2015   Procedure: ARTERIOVENOUS (AV) FISTULA CREATION-LEFT BRACHIOCEPHALIC;  Surgeon: Serafina Mitchell, MD;  Location: [place];  Service: Vascular;  Laterality: Left;  . BACK SURGERY    . CERVICAL FUSION    . CHOLECYSTECTOMY  12/02/2012   Procedure: LAPAROSCOPIC CHOLECYSTECTOMY WITH INTRAOPERATIVE CHOLANGIOGRAM;  Surgeon: Edward Jolly, MD;  Location: [place];  Service: General;  Laterality: N/A;  . EYE SURGERY Bilateral    cataract surgery  . HEMATOMA EVACUATION Left 12/24/2016   Procedure: EVACUATION HEMATOMA LEFT UPPER ARM;  Surgeon: Waynetta Sandy,  MD;  Location: [place];  Service: Vascular;  Laterality: Left;  . I&D EXTREMITY Left 12/31/2016   Procedure: IRRIGATION AND DEBRIDEMENT EXTREMITY;  Surgeon: Angelia Mould, MD;  Location: [place];  Service: Vascular;  Laterality: Left;  . INSERTION OF DIALYSIS CATHETER Right 12/24/2016   Procedure: INSERTION OF DIALYSIS CATHETER;  Surgeon: Waynetta Sandy, MD;  Location: [place];  Service: Vascular;  Laterality: Right;  . INSERTION OF DIALYSIS CATHETER Right 02/04/2017   Procedure: INSERTION OF DIALYSIS CATHETER;  Surgeon: Waynetta Sandy, MD;  Location: [place];  Service: Vascular;  Laterality: Right;  . KIDNEY TRANSPLANT  1996  . PERIPHERAL VASCULAR CATHETERIZATION Left 10/23/2016   Procedure: Fistulagram;  Surgeon: Elam Dutch, MD;  Location: [place] [facility];  Service: Cardiovascular;  Laterality: Left;  . RESECTION OF ARTERIOVENOUS FISTULA ANEURYSM Left 11/22/2015   Procedure: RESECTION OF LEFT RADIOCEPHALIC FISTULA ANEURYSM ;  Surgeon: Serafina Mitchell, MD;  Location: [place];  Service: Vascular;  Laterality: Left;  . REVISON OF ARTERIOVENOUS FISTULA Left 12/22/2016   Procedure: REVISON OF LEFT ARTERIOVENOUS FISTULA;  Surgeon: Waynetta Sandy, MD;  Location: [place];  Service: Vascular;  Laterality: Left;  . REVISON OF ARTERIOVENOUS FISTULA Left 02/04/2017   Procedure: REVISON OF LEFT UPPER ARM ARTERIOVENOUS FISTULA;  Surgeon: Waynetta Sandy, MD;  Location: [place];  Service: Vascular;  Laterality: Left;    OB History    No data available       Home Medications    Prior to Admission medications   Medication Sig Start Date End Date Taking? Authorizing Provider  acetaminophen (TYLENOL) 325 MG tablet Take 650 mg by mouth every 6 (six) hours as needed for mild pain or headache.    Yes Historical Provider, MD  allopurinol (ZYLOPRIM) 300 MG tablet Take 150 mg by mouth at bedtime.   Yes Historical Provider, MD  ALPRAZolam (XANAX) 0.25 MG tablet Take 0.25 mg  by mouth 2 (two) times daily as needed for anxiety.    Yes Historical Provider, MD  AURYXIA 1 GM 210 MG(Fe) tablet Take 420 mg by mouth 3 (three) times daily with meals.  12/22/16  Yes Historical Provider, MD  B Complex-C-Folic Acid (DIALYVITE PO) Take 1 tablet by mouth daily.   Yes Historical Provider, MD  doxazosin (CARDURA) 8 MG tablet Take 1 tablet (8 mg total) by mouth at bedtime. 12/26/16  Yes Alvia Grove, PA-C  labetalol (NORMODYNE) 200 MG tablet Take 200 mg by mouth 2 (two) times daily.    Yes Historical Provider, MD  lactulose (CHRONULAC) 10 GM/15ML solution Take 20 g by mouth daily as needed for mild constipation.   Yes Historical Provider, MD  losartan (COZAAR) 100 MG tablet Take 100 mg by mouth at bedtime.    Yes Historical Provider, MD  meclizine (ANTIVERT) 25 MG tablet Take 1 tablet (25 mg total) by mouth 2 (two) times daily as needed for dizziness. 06/24/16  Yes Merryl Hacker, MD  SENSIPAR 30 MG  tablet Take 30 mg by mouth every Monday, Wednesday, and Friday with hemodialysis.  08/20/16  Yes Historical Provider, MD  clopidogrel (PLAVIX) 75 MG tablet Take 1 tablet (75 mg total) by mouth daily. 02/26/17   Velvet Bathe, MD  simvastatin (ZOCOR) 20 MG tablet Take 1 tablet (20 mg total) by mouth at bedtime. 02/26/17   Velvet Bathe, MD    Family History Family History  Problem Relation Age of Onset  . Colon cancer Brother   . Cancer Brother   . Coronary artery disease Mother 97  . Hyperlipidemia Mother   . Hypertension Mother   . Esophageal cancer Neg Hx   . Stomach cancer Neg Hx   . Rectal cancer Neg Hx     Social History Social History  Substance Use Topics  . Smoking status: Former Smoker    Types: Cigarettes    Quit date: 12/31/1991  . Smokeless tobacco: Never Used  . Alcohol use No     Allergies   Sulfa antibiotics and Adhesive [tape]   Review of Systems Review of Systems  ROS 10 Systems reviewed and are negative for acute change except as noted in the  HPI.     Physical Exam Updated Vital Signs BP 126/74 (BP Location: Right Arm)   Pulse 85   Temp 97.8 F (36.6 C) (Oral)   Resp 16   Ht 5\' 2"  (1.575 m)   Wt 119 lb 14.9 oz (54.4 kg)   SpO2 99%   BMI 21.94 kg/m   Physical Exam  Constitutional: She is oriented to person, place, and time. She appears well-developed.  HENT:  Head: Normocephalic and atraumatic.  Eyes: EOM are normal.  Neck: Normal range of motion. Neck supple.  Cardiovascular: Normal rate.   Pulmonary/Chest: Effort normal.  Abdominal: Bowel sounds are normal.  Neurological: She is alert and oriented to person, place, and time. No cranial nerve deficit. Coordination normal.  Skin: Skin is warm and dry.  Nursing note and vitals reviewed.    ED Treatments / Results  Labs (all labs ordered are listed, but only abnormal results are displayed) Labs Reviewed  CBC - Abnormal; Notable for the following:       Result Value   Hemoglobin 11.2 (*)    HCT 35.7 (*)    RDW 18.5 (*)    All other components within normal limits  COMPREHENSIVE METABOLIC PANEL - Abnormal; Notable for the following:    Potassium 3.4 (*)    Chloride 93 (*)    Glucose, Bld 140 (*)    BUN 36 (*)    Creatinine, Ser 5.18 (*)    Calcium 8.7 (*)    Albumin 3.4 (*)    ALT 11 (*)    GFR calc non Af Amer 8 (*)    GFR calc Af Amer 9 (*)    All other components within normal limits  HEMOGLOBIN A1C - Abnormal; Notable for the following:    Hgb A1c MFr Bld 4.6 (*)    All other components within normal limits  LIPID PANEL - Abnormal; Notable for the following:    LDL Cholesterol 101 (*)    All other components within normal limits  CBC - Abnormal; Notable for the following:    Hemoglobin 11.2 (*)    RDW 18.8 (*)    All other components within normal limits  RENAL FUNCTION PANEL - Abnormal; Notable for the following:    Chloride 93 (*)    BUN 47 (*)    Creatinine,  Ser 6.42 (*)    Phosphorus 6.9 (*)    Albumin 3.2 (*)    GFR calc non Af  Amer 6 (*)    GFR calc Af Amer 7 (*)    All other components within normal limits  CBC - Abnormal; Notable for the following:    RBC 3.69 (*)    Hemoglobin 10.6 (*)    HCT 33.4 (*)    RDW 18.7 (*)    All other components within normal limits  RENAL FUNCTION PANEL - Abnormal; Notable for the following:    Sodium 133 (*)    Potassium 5.3 (*)    Chloride 91 (*)    BUN 78 (*)    Creatinine, Ser 8.67 (*)    Phosphorus 8.5 (*)    Albumin 3.0 (*)    GFR calc non Af Amer 4 (*)    GFR calc Af Amer 5 (*)    Anion gap 16 (*)    All other components within normal limits  RENAL FUNCTION PANEL - Abnormal; Notable for the following:    Sodium 133 (*)    Chloride 93 (*)    Glucose, Bld 102 (*)    BUN 43 (*)    Creatinine, Ser 5.53 (*)    Phosphorus 6.6 (*)    Albumin 2.9 (*)    GFR calc non Af Amer 7 (*)    GFR calc Af Amer 8 (*)    All other components within normal limits  CBC - Abnormal; Notable for the following:    RBC 3.70 (*)    Hemoglobin 10.4 (*)    HCT 33.3 (*)    RDW 18.0 (*)    All other components within normal limits  CBG MONITORING, ED - Abnormal; Notable for the following:    Glucose-Capillary 111 (*)    All other components within normal limits  I-STAT CHEM 8, ED - Abnormal; Notable for the following:    Potassium 3.4 (*)    Chloride 94 (*)    BUN 37 (*)    Creatinine, Ser 5.30 (*)    Glucose, Bld 137 (*)    Calcium, Ion 0.91 (*)    All other components within normal limits  PROTIME-INR  APTT  DIFFERENTIAL  MAGNESIUM  AMMONIA  TSH  I-STAT TROPOININ, ED    EKG  EKG Interpretation  Date/Time:  Tuesday February 23 2017 15:13:04 EDT Ventricular Rate:  81 PR Interval:  172 QRS Duration: 82 QT Interval:  472 QTC Calculation: 548 R Axis:   54 Text Interpretation:  Normal sinus rhythm Moderate voltage criteria for LVH, may be normal variant ST & T wave abnormality, consider lateral ischemia Prolonged QT Abnormal ECG ST elevation in the inferior and lateral  leads - likely early repo No significant change since last tracing Confirmed by Kathrynn Humble, MD, Thelma Comp ([id number]) on 02/23/2017 10:31:26 PM       Radiology No results found.  Procedures Procedures (including critical care time)  CRITICAL CARE Performed by: Varney Biles   Total critical care time: 35 minutes  Critical care time was exclusive of separately billable procedures and treating other patients.  Critical care was necessary to treat or prevent imminent or life-threatening deterioration.  Critical care was time spent personally by me on the following activities: development of treatment plan with patient and/or surrogate as well as nursing, discussions with consultants, evaluation of patient's response to treatment, examination of patient, obtaining history from patient or surrogate, ordering and performing treatments and interventions, ordering  and review of laboratory studies, ordering and review of radiographic studies, pulse oximetry and re-evaluation of patient's condition.   Medications Ordered in ED Medications  potassium chloride SA (K-DUR,KLOR-CON) CR tablet 20 mEq (20 mEq Oral Not Given 02/23/17 2335)   stroke: mapping our early stages of recovery book ( Does not apply Given 02/24/17 0009)  diphenhydrAMINE (BENADRYL) 12.5 MG/5ML elixir 12.5 mg (12.5 mg Oral Given 02/25/17 1653)     Initial Impression / Assessment and Plan / ED Course  I have reviewed the triage vital signs and the nursing notes.  Pertinent labs & imaging results that were available during my care of the patient were reviewed by me and considered in my medical decision making (see chart for details).     Pt has some alteration to her mental status and ataxia. MRI head shows acute stroke -we will admit.  Final Clinical Impressions(s) / ED Diagnoses   Final diagnoses:  Acute ischemic stroke ([place])  Brain bleed [facility])    New Prescriptions Discharge Medication List as of 02/26/2017  6:13 PM     START taking these medications   Details  clopidogrel (PLAVIX) 75 MG tablet Take 1 tablet (75 mg total) by mouth daily., Starting Fri 02/26/2017, Normal         Varney Biles, MD 03/01/17 [place], MD 03/01/17 3507

## 2017-03-03 DIAGNOSIS — N2581 Secondary hyperparathyroidism of renal origin: Secondary | ICD-10-CM | POA: Diagnosis not present

## 2017-03-03 DIAGNOSIS — N186 End stage renal disease: Secondary | ICD-10-CM | POA: Diagnosis not present

## 2017-03-03 DIAGNOSIS — D631 Anemia in chronic kidney disease: Secondary | ICD-10-CM | POA: Diagnosis not present

## 2017-03-05 ENCOUNTER — Encounter: Payer: Medicare Other | Admitting: Vascular Surgery

## 2017-03-05 DIAGNOSIS — D631 Anemia in chronic kidney disease: Secondary | ICD-10-CM | POA: Diagnosis not present

## 2017-03-05 DIAGNOSIS — N2581 Secondary hyperparathyroidism of renal origin: Secondary | ICD-10-CM | POA: Diagnosis not present

## 2017-03-05 DIAGNOSIS — N186 End stage renal disease: Secondary | ICD-10-CM | POA: Diagnosis not present

## 2017-03-08 DIAGNOSIS — N186 End stage renal disease: Secondary | ICD-10-CM | POA: Diagnosis not present

## 2017-03-08 DIAGNOSIS — N2581 Secondary hyperparathyroidism of renal origin: Secondary | ICD-10-CM | POA: Diagnosis not present

## 2017-03-08 DIAGNOSIS — D631 Anemia in chronic kidney disease: Secondary | ICD-10-CM | POA: Diagnosis not present

## 2017-03-10 ENCOUNTER — Ambulatory Visit (INDEPENDENT_AMBULATORY_CARE_PROVIDER_SITE_OTHER): Payer: Self-pay | Admitting: Vascular Surgery

## 2017-03-10 VITALS — BP 141/86 | HR 83 | Temp 97.5°F | Resp 16 | Ht 62.0 in | Wt 120.0 lb

## 2017-03-10 DIAGNOSIS — N186 End stage renal disease: Secondary | ICD-10-CM

## 2017-03-10 NOTE — Progress Notes (Signed)
Patient name: [name] MRN: [id number] DOB: [birth year] Sex: female  REASON FOR VISIT: post-op  HPI: [name] is a 66 y.o. female who presents for post-operative follow-up s/p resection of pseudoaneurysm left upper arm brachiocephalic AV fistula and right IJ TDC insertion on 02/04/17. The patient had undergone multiple revisions prior to this. On 12/22/16, she had revision of her left arm fistula with excision of pseudoaneurysm by Dr. Donzetta Matters. Two days later (12/24/16), she developed a hematoma of her left arm and required evacuation and TDC placement. She then presented with left upper arm wound dehiscence and had closure of her left upper arm wound on 12/31/16 by Dr. Scot Dock.   She had her TDC removed on 01/26/17 as her left arm was healing nicely. Unfortunately, she had a significant infiltration with cannulation and was taken to the OR again on 9/35/70 for plication of her left arm fistula pseudoaneurysm and TDC placement.   In the interim, she presented to the [facility] ED on 02/23/17 due to altered mental status during HD. 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 Neurology felt that this was most likely secondary to small vessel disease in the stetting of possible cerebral hypoperfusion following HD. She has had occasional heart palpations in the past and was discharged on 02/26/17 with instructions to see PCP for a 30 event monitor. Her carotid duplex showed <40% ICA stenosis bilaterally.   She reports itching with her right IJ catheter. She has not seen her PCP yet regarding an event monitor. She denies any pain with her left arm or hand.   She denies any recent palpations or irregular heart beat.   Current Outpatient Prescriptions  Medication Sig Dispense Refill  . allopurinol (ZYLOPRIM) 300 MG tablet Take 150 mg by mouth at bedtime.    . ALPRAZolam (XANAX) 0.25 MG tablet Take 0.25 mg by mouth 2 (two) times daily as needed for  anxiety.     Lorin Picket 1 GM 210 MG(Fe) tablet Take 420 mg by mouth 3 (three) times daily with meals.     . B Complex-C-Folic Acid (DIALYVITE PO) Take 1 tablet by mouth daily.    . clopidogrel (PLAVIX) 75 MG tablet Take 1 tablet (75 mg total) by mouth daily. 30 tablet 0  . doxazosin (CARDURA) 8 MG tablet Take 1 tablet (8 mg total) by mouth at bedtime.    Marland Kitchen labetalol (NORMODYNE) 200 MG tablet Take 200 mg by mouth 2 (two) times daily.     Marland Kitchen lactulose (CHRONULAC) 10 GM/15ML solution Take 20 g by mouth daily as needed for mild constipation.    Marland Kitchen losartan (COZAAR) 100 MG tablet Take 100 mg by mouth at bedtime.     . meclizine (ANTIVERT) 25 MG tablet Take 1 tablet (25 mg total) by mouth 2 (two) times daily as needed for dizziness. 30 tablet 0  . SENSIPAR 30 MG tablet Take 30 mg by mouth every Monday, Wednesday, and Friday with hemodialysis.     Marland Kitchen simvastatin (ZOCOR) 20 MG tablet Take 1 tablet (20 mg total) by mouth at bedtime. 30 tablet 0   No current facility-administered medications for this visit.     REVIEW OF SYSTEMS:  [X]  denotes positive finding, [ ]  denotes negative finding Cardiac  Comments:  Chest pain or chest pressure:    Shortness of breath upon exertion:    Short of breath when lying flat:    Irregular heart rhythm: x   Constitutional  Fever or chills:      PHYSICAL EXAM: Vitals:   03/10/17 1355  BP: (!) 141/86  Pulse: 83  Resp: 16  Temp: 97.5 F (36.4 C)  TempSrc: Oral  SpO2: 94%  Weight: 120 lb (54.4 kg)  Height: 5\' 2"  (1.575 m)    GENERAL: The patient is a well-nourished female, in no acute distress. The vital signs are documented above. VASCULAR: Left upper arm incision well healed with staples intact. Palpable thrill throughout fistula. Right IJ TDC without erythema or drainage.   MEDICAL ISSUES: ESRD on HD  Staples removed today in office. Ok to start cannulating left upper arm fistula. Once fistula successfully cannulated x 1 week, TDC can be removed.  Will send note to Dr. Joelyn Oms  Recent CVA  She denies any residual symptoms. Hospital workup revealed <40% ICA stenosis bilaterally. She does not have an appointment with her PCP Dr. Delfina Redwood yet regarding a holter monitor. I tried to call to making an appointment and am awaiting a call back.   Virgina Jock, PA-C [facility] MD: Donzetta Matters

## 2017-03-12 DIAGNOSIS — N186 End stage renal disease: Secondary | ICD-10-CM | POA: Diagnosis not present

## 2017-03-12 DIAGNOSIS — N2581 Secondary hyperparathyroidism of renal origin: Secondary | ICD-10-CM | POA: Diagnosis not present

## 2017-03-12 DIAGNOSIS — D631 Anemia in chronic kidney disease: Secondary | ICD-10-CM | POA: Diagnosis not present

## 2017-03-13 DIAGNOSIS — T8612 Kidney transplant failure: Secondary | ICD-10-CM | POA: Diagnosis not present

## 2017-03-13 DIAGNOSIS — Z992 Dependence on renal dialysis: Secondary | ICD-10-CM | POA: Diagnosis not present

## 2017-03-13 DIAGNOSIS — N186 End stage renal disease: Secondary | ICD-10-CM | POA: Diagnosis not present

## 2017-03-15 ENCOUNTER — Other Ambulatory Visit: Payer: Medicare Other | Admitting: *Deleted

## 2017-03-15 ENCOUNTER — Other Ambulatory Visit: Payer: Self-pay | Admitting: *Deleted

## 2017-03-15 DIAGNOSIS — N186 End stage renal disease: Secondary | ICD-10-CM | POA: Diagnosis not present

## 2017-03-15 DIAGNOSIS — D631 Anemia in chronic kidney disease: Secondary | ICD-10-CM | POA: Diagnosis not present

## 2017-03-15 DIAGNOSIS — N2581 Secondary hyperparathyroidism of renal origin: Secondary | ICD-10-CM | POA: Diagnosis not present

## 2017-03-15 NOTE — Patient Outreach (Signed)
[facility]) Care Management  03/15/2017  [name] [birth year] [id number]  Referral via EMMI-Stroke red dashboard for went to follow up appointment & scheduled appointment -no:  Telephone call to patient left message on voice mail requesting call back.  Plan: Will follow up.  Sherrin Daisy, RN BSN [place] Management Coordinator [facility] Care Management  [phone]

## 2017-03-16 ENCOUNTER — Other Ambulatory Visit: Payer: Self-pay | Admitting: *Deleted

## 2017-03-16 ENCOUNTER — Encounter: Payer: Self-pay | Admitting: *Deleted

## 2017-03-16 NOTE — Patient Outreach (Signed)
[name] [name]) Care Management  03/16/2017  [name] 07-29-1951 [id number]  EMMI Stroke referral for red dashboard for went to follow up appointment -no & scheduled follow up appointment-no:   Telephone call to patient who was advised of reason for call. HIPPA verification  received from patient.   Patient voices that she recently had stroke prior to receiving hemodialysis with symptoms of being off balance, confused & headaches.  States she has history of several strokes & TIA's.  Voices that she is home now & cares for self. States she is currently driving but her sister can drive for her if needed.  Voices that she is aware of importance of calling 911 if she experiences any of previous symptoms of stroke. Other symptoms of stroke reviewed with patient who voices understanding. Will send list of stroke symptoms as a means of re-enforcement for patient to review.    States she missed first scheduled appointment with primary care following hospital visit but has rescheduled appointment for 04/05 with Dr.R, Polite. Also has appointment with neurologist 05/04/2017. States she has transportation to appointments and plans to attend.   Emmi call has been addressed.  Plan: Send patient stroke symptoms & action plan educational material as means of review. Close case/send to care management assistant.  Sherrin Daisy, RN BSN [place] Management Coordinator [facility] Care Management  [phone]

## 2017-03-17 DIAGNOSIS — D631 Anemia in chronic kidney disease: Secondary | ICD-10-CM | POA: Diagnosis not present

## 2017-03-17 DIAGNOSIS — N2581 Secondary hyperparathyroidism of renal origin: Secondary | ICD-10-CM | POA: Diagnosis not present

## 2017-03-17 DIAGNOSIS — N186 End stage renal disease: Secondary | ICD-10-CM | POA: Diagnosis not present

## 2017-03-18 DIAGNOSIS — I639 Cerebral infarction, unspecified: Secondary | ICD-10-CM | POA: Diagnosis not present

## 2017-03-18 DIAGNOSIS — E7800 Pure hypercholesterolemia, unspecified: Secondary | ICD-10-CM | POA: Diagnosis not present

## 2017-03-18 DIAGNOSIS — R413 Other amnesia: Secondary | ICD-10-CM | POA: Diagnosis not present

## 2017-03-18 DIAGNOSIS — I5020 Unspecified systolic (congestive) heart failure: Secondary | ICD-10-CM | POA: Diagnosis not present

## 2017-03-18 DIAGNOSIS — R410 Disorientation, unspecified: Secondary | ICD-10-CM | POA: Diagnosis not present

## 2017-03-19 DIAGNOSIS — N186 End stage renal disease: Secondary | ICD-10-CM | POA: Diagnosis not present

## 2017-03-19 DIAGNOSIS — N2581 Secondary hyperparathyroidism of renal origin: Secondary | ICD-10-CM | POA: Diagnosis not present

## 2017-03-19 DIAGNOSIS — D631 Anemia in chronic kidney disease: Secondary | ICD-10-CM | POA: Diagnosis not present

## 2017-03-22 DIAGNOSIS — D631 Anemia in chronic kidney disease: Secondary | ICD-10-CM | POA: Diagnosis not present

## 2017-03-22 DIAGNOSIS — N186 End stage renal disease: Secondary | ICD-10-CM | POA: Diagnosis not present

## 2017-03-22 DIAGNOSIS — N2581 Secondary hyperparathyroidism of renal origin: Secondary | ICD-10-CM | POA: Diagnosis not present

## 2017-03-23 ENCOUNTER — Other Ambulatory Visit: Payer: Self-pay | Admitting: Internal Medicine

## 2017-03-23 ENCOUNTER — Other Ambulatory Visit: Payer: Self-pay

## 2017-03-23 DIAGNOSIS — I480 Paroxysmal atrial fibrillation: Secondary | ICD-10-CM | POA: Insufficient documentation

## 2017-03-23 DIAGNOSIS — I639 Cerebral infarction, unspecified: Secondary | ICD-10-CM

## 2017-03-23 DIAGNOSIS — I4891 Unspecified atrial fibrillation: Secondary | ICD-10-CM

## 2017-03-24 ENCOUNTER — Other Ambulatory Visit (HOSPITAL_COMMUNITY): Payer: Self-pay | Admitting: *Deleted

## 2017-03-24 DIAGNOSIS — N186 End stage renal disease: Secondary | ICD-10-CM | POA: Diagnosis not present

## 2017-03-24 DIAGNOSIS — N2581 Secondary hyperparathyroidism of renal origin: Secondary | ICD-10-CM | POA: Diagnosis not present

## 2017-03-24 DIAGNOSIS — D631 Anemia in chronic kidney disease: Secondary | ICD-10-CM | POA: Diagnosis not present

## 2017-03-25 ENCOUNTER — Encounter: Payer: Self-pay | Admitting: *Deleted

## 2017-03-25 ENCOUNTER — Ambulatory Visit (HOSPITAL_COMMUNITY)
Admission: RE | Admit: 2017-03-25 | Discharge: 2017-03-25 | Disposition: A | Discharge status: Home / Self Care | Payer: Medicare Other | Source: Ambulatory Visit | Attending: Vascular Surgery | Admitting: Vascular Surgery

## 2017-03-25 ENCOUNTER — Ambulatory Visit: Payer: Medicare Other

## 2017-03-25 DIAGNOSIS — R634 Abnormal weight loss: Secondary | ICD-10-CM | POA: Diagnosis not present

## 2017-03-25 DIAGNOSIS — Z940 Kidney transplant status: Secondary | ICD-10-CM | POA: Insufficient documentation

## 2017-03-25 DIAGNOSIS — Z9889 Other specified postprocedural states: Secondary | ICD-10-CM | POA: Insufficient documentation

## 2017-03-25 DIAGNOSIS — Z87891 Personal history of nicotine dependence: Secondary | ICD-10-CM | POA: Insufficient documentation

## 2017-03-25 DIAGNOSIS — Z79899 Other long term (current) drug therapy: Secondary | ICD-10-CM | POA: Insufficient documentation

## 2017-03-25 DIAGNOSIS — F418 Other specified anxiety disorders: Secondary | ICD-10-CM | POA: Insufficient documentation

## 2017-03-25 DIAGNOSIS — Z452 Encounter for adjustment and management of vascular access device: Secondary | ICD-10-CM | POA: Diagnosis not present

## 2017-03-25 DIAGNOSIS — I509 Heart failure, unspecified: Secondary | ICD-10-CM | POA: Insufficient documentation

## 2017-03-25 DIAGNOSIS — N185 Chronic kidney disease, stage 5: Secondary | ICD-10-CM | POA: Diagnosis not present

## 2017-03-25 DIAGNOSIS — Z8673 Personal history of transient ischemic attack (TIA), and cerebral infarction without residual deficits: Secondary | ICD-10-CM | POA: Insufficient documentation

## 2017-03-25 DIAGNOSIS — D631 Anemia in chronic kidney disease: Secondary | ICD-10-CM | POA: Diagnosis not present

## 2017-03-25 DIAGNOSIS — I132 Hypertensive heart and chronic kidney disease with heart failure and with stage 5 chronic kidney disease, or end stage renal disease: Secondary | ICD-10-CM | POA: Insufficient documentation

## 2017-03-25 DIAGNOSIS — Z888 Allergy status to other drugs, medicaments and biological substances status: Secondary | ICD-10-CM | POA: Diagnosis not present

## 2017-03-25 DIAGNOSIS — K219 Gastro-esophageal reflux disease without esophagitis: Secondary | ICD-10-CM | POA: Insufficient documentation

## 2017-03-25 DIAGNOSIS — E785 Hyperlipidemia, unspecified: Secondary | ICD-10-CM | POA: Diagnosis not present

## 2017-03-25 DIAGNOSIS — Z7902 Long term (current) use of antithrombotics/antiplatelets: Secondary | ICD-10-CM | POA: Insufficient documentation

## 2017-03-25 DIAGNOSIS — Z992 Dependence on renal dialysis: Secondary | ICD-10-CM | POA: Insufficient documentation

## 2017-03-25 MED ORDER — LIDOCAINE HCL (PF) 1 % IJ SOLN
INTRAMUSCULAR | Status: AC
  Filled 2017-03-25: qty 5

## 2017-03-25 NOTE — Progress Notes (Signed)
Patient ID: OLA RAAP, female   DOB: 20-Jul-1951, 66 y.o.   MRN: [id number] Patient did not show up for 03/25/17, 8:30 AM, appointment, to have a cardiac event monitor applied.

## 2017-03-25 NOTE — Progress Notes (Signed)
Right chest dressing CDI and pt DC home with family

## 2017-03-25 NOTE — Progress Notes (Signed)
H&P    CC:  Catheter removal   HPI:  This is a 66 y.o. female here for diatek catheter removal.  She had a resection of a PSA of the LUA BC AVF and right IJ on 02/04/17.  She had the [facility] removed on 01/26/17 and unfortunately, she had a significant infiltration with cannulation and was taken to the OR again on 0/99/83 for plication of her left arm fistula pseudoaneurysm and TDC placement.  She is here today for catheter removal.  They have been able to use the fistula without difficulty for 2-3x.    She is on Plavix.   Past Medical History:  Diagnosis Date  . Adenomatous polyp of colon 10/2010, 2006, 2015  . Anemia in CKD (chronic kidney disease) 11/07/2012   s/p blood transfusion.   . Arthritis   . CHF (congestive heart failure) ([place])   . Constipation   . Depression with anxiety   . Diverticula, colon   . 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  Had deceased donor kidney transplant in 1996.  Had some early rejection then stable function for years, then had slow decline of function and went back on hemodialysis in 2012.  Gets HD TTS schedule at [facility] on [facility] still using L forearm AVF.     Marland Kitchen GERD (gastroesophageal reflux disease)   . Headache   . Hyperlipidemia   . Hypertension   . Neuromuscular disorder (HCC)    neuropathy hand and legs  . Osteoporosis   . Pneumonia   . 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 Stroke [facility]) 11/2015   TIA  . Weight loss, unintentional     FH:  Non-Contributory  Social History   Social History  . Marital status: Widowed    Spouse name: N/A  . Number of children: 2  . Years of education: N/A   Occupational History  . Not on file.   Social History Main Topics  . Smoking status: Former Smoker    Types: Cigarettes    Quit date: 12/31/1991  . Smokeless tobacco: Never Used  . Alcohol use No  . Drug use: No  . Sexual  activity: No   Other Topics Concern  . Not on file   Social History Narrative  . No narrative on file    Allergies  Allergen Reactions  . Sulfa Antibiotics Other (See Comments)    Both parents allergic-so will not take  . Adhesive [Tape] Itching    Current Outpatient Prescriptions  Medication Sig Dispense Refill  . allopurinol (ZYLOPRIM) 300 MG tablet Take 150 mg by mouth at bedtime.    . ALPRAZolam (XANAX) 0.25 MG tablet Take 0.25 mg by mouth 2 (two) times daily as needed for anxiety.     Lorin Picket 1 GM 210 MG(Fe) tablet Take 420 mg by mouth 3 (three) times daily with meals.     . B Complex-C-Folic Acid (DIALYVITE PO) Take 1 tablet by mouth daily.    . clopidogrel (PLAVIX) 75 MG tablet Take 1 tablet (75 mg total) by mouth daily. 30 tablet 0  . doxazosin (CARDURA) 8 MG tablet Take 1 tablet (8 mg total) by mouth at bedtime.    Marland Kitchen labetalol (NORMODYNE) 200 MG tablet Take 200 mg by mouth 2 (two) times daily.     Marland Kitchen lactulose (CHRONULAC) 10 GM/15ML solution Take 20 g by mouth  daily as needed for mild constipation.    Marland Kitchen losartan (COZAAR) 100 MG tablet Take 100 mg by mouth at bedtime.     . meclizine (ANTIVERT) 25 MG tablet Take 1 tablet (25 mg total) by mouth 2 (two) times daily as needed for dizziness. 30 tablet 0  . SENSIPAR 30 MG tablet Take 30 mg by mouth every Monday, Wednesday, and Friday with hemodialysis.     Marland Kitchen simvastatin (ZOCOR) 20 MG tablet Take 1 tablet (20 mg total) by mouth at bedtime. 30 tablet 0   Current Facility-Administered Medications  Medication Dose Route Frequency Provider Last Rate Last Dose  . lidocaine (PF) (XYLOCAINE) 1 % injection             ROS:  See HPI  PHYSICAL EXAM  Vitals:   03/25/17 1057  BP: (!) 158/85  Pulse: 87  Resp: 20  Temp: 98 F (36.7 C)    Gen:  Well developed well nourished HEENT:  normocephalic Neck:  Right IJ in place Heart:  regular Lungs:  Non-labored Extremities:  +thrill/bruit within fistula; 2+ palpable radial pulses  bilaterally Skin:  No obvious rashes Neuro:  In tact  Lab/X-ray:  Impression: This is a 66 y.o. female here for diatek catheter removal  Plan:  Removal of right diatek catheter  Leontine Locket, PA-C [facility] [phone] 03/25/2017 11:11 AM

## 2017-03-25 NOTE — Progress Notes (Signed)
  Catheter Removal Procedure Note    Diagnosis: ESRD  Plan:  Remove right diatek catheter  Consent signed:  Yes.   Time out completed:  Yes.   Coumadin:  No. PT/INR (if applicable):   Other labs:  Procedure: 1.  Sterile prepping and draping over catheter area 2. 8 ml 2% lidocaine plain instilled at removal site. 3.  right catheter removed in its entirety with cuff in tact. 4.  Complications:  none 5. Tip of catheter sent for culture:  No.   Patient tolerated procedure well:  Yes.   Pressure held, no bleeding noted, dressing applied Instructions given to the pt regarding wound care and bleeding.  OtherLeontine Locket, PA-C 03/25/2017 11:42 AM [phone]

## 2017-03-26 DIAGNOSIS — N186 End stage renal disease: Secondary | ICD-10-CM | POA: Diagnosis not present

## 2017-03-26 DIAGNOSIS — D631 Anemia in chronic kidney disease: Secondary | ICD-10-CM | POA: Diagnosis not present

## 2017-03-26 DIAGNOSIS — N2581 Secondary hyperparathyroidism of renal origin: Secondary | ICD-10-CM | POA: Diagnosis not present

## 2017-03-29 DIAGNOSIS — D631 Anemia in chronic kidney disease: Secondary | ICD-10-CM | POA: Diagnosis not present

## 2017-03-29 DIAGNOSIS — N186 End stage renal disease: Secondary | ICD-10-CM | POA: Diagnosis not present

## 2017-03-29 DIAGNOSIS — N2581 Secondary hyperparathyroidism of renal origin: Secondary | ICD-10-CM | POA: Diagnosis not present

## 2017-03-31 DIAGNOSIS — N2581 Secondary hyperparathyroidism of renal origin: Secondary | ICD-10-CM | POA: Diagnosis not present

## 2017-03-31 DIAGNOSIS — N186 End stage renal disease: Secondary | ICD-10-CM | POA: Diagnosis not present

## 2017-03-31 DIAGNOSIS — D631 Anemia in chronic kidney disease: Secondary | ICD-10-CM | POA: Diagnosis not present

## 2017-04-02 DIAGNOSIS — N2581 Secondary hyperparathyroidism of renal origin: Secondary | ICD-10-CM | POA: Diagnosis not present

## 2017-04-02 DIAGNOSIS — D631 Anemia in chronic kidney disease: Secondary | ICD-10-CM | POA: Diagnosis not present

## 2017-04-02 DIAGNOSIS — N186 End stage renal disease: Secondary | ICD-10-CM | POA: Diagnosis not present

## 2017-04-05 DIAGNOSIS — N2581 Secondary hyperparathyroidism of renal origin: Secondary | ICD-10-CM | POA: Diagnosis not present

## 2017-04-05 DIAGNOSIS — D631 Anemia in chronic kidney disease: Secondary | ICD-10-CM | POA: Diagnosis not present

## 2017-04-05 DIAGNOSIS — N186 End stage renal disease: Secondary | ICD-10-CM | POA: Diagnosis not present

## 2017-04-06 ENCOUNTER — Ambulatory Visit (INDEPENDENT_AMBULATORY_CARE_PROVIDER_SITE_OTHER): Payer: Medicare Other

## 2017-04-06 DIAGNOSIS — I4891 Unspecified atrial fibrillation: Secondary | ICD-10-CM | POA: Diagnosis not present

## 2017-04-06 DIAGNOSIS — I639 Cerebral infarction, unspecified: Secondary | ICD-10-CM | POA: Diagnosis not present

## 2017-04-07 DIAGNOSIS — N2581 Secondary hyperparathyroidism of renal origin: Secondary | ICD-10-CM | POA: Diagnosis not present

## 2017-04-07 DIAGNOSIS — D631 Anemia in chronic kidney disease: Secondary | ICD-10-CM | POA: Diagnosis not present

## 2017-04-07 DIAGNOSIS — N186 End stage renal disease: Secondary | ICD-10-CM | POA: Diagnosis not present

## 2017-04-09 DIAGNOSIS — D631 Anemia in chronic kidney disease: Secondary | ICD-10-CM | POA: Diagnosis not present

## 2017-04-09 DIAGNOSIS — N2581 Secondary hyperparathyroidism of renal origin: Secondary | ICD-10-CM | POA: Diagnosis not present

## 2017-04-09 DIAGNOSIS — N186 End stage renal disease: Secondary | ICD-10-CM | POA: Diagnosis not present

## 2017-04-12 DIAGNOSIS — D631 Anemia in chronic kidney disease: Secondary | ICD-10-CM | POA: Diagnosis not present

## 2017-04-12 DIAGNOSIS — N186 End stage renal disease: Secondary | ICD-10-CM | POA: Diagnosis not present

## 2017-04-12 DIAGNOSIS — T8612 Kidney transplant failure: Secondary | ICD-10-CM | POA: Diagnosis not present

## 2017-04-12 DIAGNOSIS — N2581 Secondary hyperparathyroidism of renal origin: Secondary | ICD-10-CM | POA: Diagnosis not present

## 2017-04-12 DIAGNOSIS — Z992 Dependence on renal dialysis: Secondary | ICD-10-CM | POA: Diagnosis not present

## 2017-04-14 DIAGNOSIS — N2581 Secondary hyperparathyroidism of renal origin: Secondary | ICD-10-CM | POA: Diagnosis not present

## 2017-04-14 DIAGNOSIS — D631 Anemia in chronic kidney disease: Secondary | ICD-10-CM | POA: Diagnosis not present

## 2017-04-14 DIAGNOSIS — N186 End stage renal disease: Secondary | ICD-10-CM | POA: Diagnosis not present

## 2017-04-16 DIAGNOSIS — D631 Anemia in chronic kidney disease: Secondary | ICD-10-CM | POA: Diagnosis not present

## 2017-04-16 DIAGNOSIS — N2581 Secondary hyperparathyroidism of renal origin: Secondary | ICD-10-CM | POA: Diagnosis not present

## 2017-04-16 DIAGNOSIS — N186 End stage renal disease: Secondary | ICD-10-CM | POA: Diagnosis not present

## 2017-04-19 DIAGNOSIS — D631 Anemia in chronic kidney disease: Secondary | ICD-10-CM | POA: Diagnosis not present

## 2017-04-19 DIAGNOSIS — N2581 Secondary hyperparathyroidism of renal origin: Secondary | ICD-10-CM | POA: Diagnosis not present

## 2017-04-19 DIAGNOSIS — N186 End stage renal disease: Secondary | ICD-10-CM | POA: Diagnosis not present

## 2017-04-21 DIAGNOSIS — N186 End stage renal disease: Secondary | ICD-10-CM | POA: Diagnosis not present

## 2017-04-21 DIAGNOSIS — N2581 Secondary hyperparathyroidism of renal origin: Secondary | ICD-10-CM | POA: Diagnosis not present

## 2017-04-21 DIAGNOSIS — D631 Anemia in chronic kidney disease: Secondary | ICD-10-CM | POA: Diagnosis not present

## 2017-04-23 DIAGNOSIS — N186 End stage renal disease: Secondary | ICD-10-CM | POA: Diagnosis not present

## 2017-04-23 DIAGNOSIS — D631 Anemia in chronic kidney disease: Secondary | ICD-10-CM | POA: Diagnosis not present

## 2017-04-23 DIAGNOSIS — N2581 Secondary hyperparathyroidism of renal origin: Secondary | ICD-10-CM | POA: Diagnosis not present

## 2017-04-26 DIAGNOSIS — N186 End stage renal disease: Secondary | ICD-10-CM | POA: Diagnosis not present

## 2017-04-26 DIAGNOSIS — N2581 Secondary hyperparathyroidism of renal origin: Secondary | ICD-10-CM | POA: Diagnosis not present

## 2017-04-26 DIAGNOSIS — D631 Anemia in chronic kidney disease: Secondary | ICD-10-CM | POA: Diagnosis not present

## 2017-04-27 DIAGNOSIS — Z961 Presence of intraocular lens: Secondary | ICD-10-CM | POA: Diagnosis not present

## 2017-04-27 DIAGNOSIS — H3530 Unspecified macular degeneration: Secondary | ICD-10-CM | POA: Diagnosis not present

## 2017-04-27 DIAGNOSIS — H43393 Other vitreous opacities, bilateral: Secondary | ICD-10-CM | POA: Diagnosis not present

## 2017-04-27 DIAGNOSIS — H524 Presbyopia: Secondary | ICD-10-CM | POA: Diagnosis not present

## 2017-04-28 DIAGNOSIS — N186 End stage renal disease: Secondary | ICD-10-CM | POA: Diagnosis not present

## 2017-04-28 DIAGNOSIS — D631 Anemia in chronic kidney disease: Secondary | ICD-10-CM | POA: Diagnosis not present

## 2017-04-28 DIAGNOSIS — N2581 Secondary hyperparathyroidism of renal origin: Secondary | ICD-10-CM | POA: Diagnosis not present

## 2017-04-29 DIAGNOSIS — I639 Cerebral infarction, unspecified: Secondary | ICD-10-CM | POA: Diagnosis not present

## 2017-04-29 DIAGNOSIS — E7800 Pure hypercholesterolemia, unspecified: Secondary | ICD-10-CM | POA: Diagnosis not present

## 2017-04-29 DIAGNOSIS — R413 Other amnesia: Secondary | ICD-10-CM | POA: Diagnosis not present

## 2017-04-29 DIAGNOSIS — R410 Disorientation, unspecified: Secondary | ICD-10-CM | POA: Diagnosis not present

## 2017-04-30 DIAGNOSIS — N2581 Secondary hyperparathyroidism of renal origin: Secondary | ICD-10-CM | POA: Diagnosis not present

## 2017-04-30 DIAGNOSIS — N186 End stage renal disease: Secondary | ICD-10-CM | POA: Diagnosis not present

## 2017-04-30 DIAGNOSIS — D631 Anemia in chronic kidney disease: Secondary | ICD-10-CM | POA: Diagnosis not present

## 2017-05-03 DIAGNOSIS — N186 End stage renal disease: Secondary | ICD-10-CM | POA: Diagnosis not present

## 2017-05-03 DIAGNOSIS — N2581 Secondary hyperparathyroidism of renal origin: Secondary | ICD-10-CM | POA: Diagnosis not present

## 2017-05-03 DIAGNOSIS — D631 Anemia in chronic kidney disease: Secondary | ICD-10-CM | POA: Diagnosis not present

## 2017-05-04 ENCOUNTER — Encounter: Payer: Self-pay | Admitting: Nurse Practitioner

## 2017-05-04 ENCOUNTER — Encounter (INDEPENDENT_AMBULATORY_CARE_PROVIDER_SITE_OTHER): Payer: Self-pay

## 2017-05-04 ENCOUNTER — Ambulatory Visit (INDEPENDENT_AMBULATORY_CARE_PROVIDER_SITE_OTHER): Payer: Medicare Other | Admitting: Nurse Practitioner

## 2017-05-04 VITALS — BP 160/80 | HR 77 | Ht 62.0 in | Wt 119.6 lb

## 2017-05-04 DIAGNOSIS — I639 Cerebral infarction, unspecified: Secondary | ICD-10-CM

## 2017-05-04 DIAGNOSIS — I10 Essential (primary) hypertension: Secondary | ICD-10-CM

## 2017-05-04 DIAGNOSIS — E785 Hyperlipidemia, unspecified: Secondary | ICD-10-CM | POA: Diagnosis not present

## 2017-05-04 NOTE — Patient Instructions (Signed)
Stressed the importance of management of risk factors to prevent further stroke Continue Plavix for secondary stroke prevention Maintain strict control of hypertension with blood pressure goal below 130/90, today's reading 160/80 continue antihypertensive medications Control of diabetes with hemoglobin A1c below 6.5 followed by primary care most recent hemoglobin A1c 4.6  continue diabetic medications Cholesterol with LDL cholesterol less than 70, followed by primary care,   continue statin drugs Exercise by walking, slowly increase , eat healthy diet with whole grains,  fresh fruits and vegetables Follow up in 6 months

## 2017-05-04 NOTE — Progress Notes (Signed)
[facility]  PATIENT: [name] DOB: 02/04/1951   REASON FOR VISIT: Hospital follow-up for stroke right ACA small infarct HISTORY FROM: Patient    HISTORY OF PRESENT ILLNESS:Kathryn F Hesteris an 66 y.o.femalewho had dialysis today when patient's sister went to pick her up and noted that she was not acting her usual self. Patient was taken to go to the ED. Patient on arrival stated she was fuzzy headed. She's had TIAs in the past. When asked how she feels she states that she just feels fuzzy headed, she is very lethargic and specifically denies having focal symptoms. She was was to have dialysis yesterday however she missed secondary to the snow. While in CT, patient had no localizing or lateralizing symptoms. The patient answered all questions appropriately. Patient does continue to say that she just does not feel "right in the head." Code stroke was canceled. She was LKE 02/23/2017 at 1100. Patient was not administered IV t-PA secondary to no focal neurologic symptoms. 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 MRA no significant findings. Carotid Doppler unremarkable. 2-D echo 50-55%EF. LDL 101. Hemoglobin A1c 4.6 patient was on aspirin 325 daily and switched to Plavix. She returns to the stroke clinic today for follow-up. She remains on Plavix for secondary stroke prevention without further stroke or TIA symptoms. She has minimal bruising and no bleeding she is also on Zocor for hyperlipidemia without complaints of myalgias. Blood pressure in the office today 160/80. She is a dialysis patient. She had 30 day event monitoring done and just turned in to rule out atrial fibrillation. No results yet. She has stopped her Silver sneakers and was encouraged to reenroll for exercise. She returns for reevaluation  REVIEW OF SYSTEMS: Full 14 system review of systems performed and notable only for those listed, all others are neg:    Constitutional: Fatigue  Cardiovascular: neg Ear/Nose/Throat: neg  Skin: neg Eyes: neg Respiratory: neg Gastroitestinal: neg  Hematology/Lymphatic: neg  Endocrine: neg Musculoskeletal:neg Allergy/Immunology: neg Neurological: neg Psychiatric: neg Sleep : neg   ALLERGIES: Allergies  Allergen Reactions  . Sulfa Antibiotics Other (See Comments)    Both parents allergic-so will not take  . Adhesive [Tape] Itching    HOME MEDICATIONS: Outpatient Medications Prior to Visit  Medication Sig Dispense Refill  . allopurinol (ZYLOPRIM) 300 MG tablet Take 150 mg by mouth at bedtime.    . ALPRAZolam (XANAX) 0.25 MG tablet Take 0.25 mg by mouth 2 (two) times daily as needed for anxiety.     [name] 1 GM 210 MG(Fe) tablet Take 420 mg by mouth 3 (three) times daily with meals.     . B Complex-C-Folic Acid (DIALYVITE PO) Take 1 tablet by mouth daily.    . clopidogrel (PLAVIX) 75 MG tablet Take 1 tablet (75 mg total) by mouth daily. 30 tablet 0  . doxazosin (CARDURA) 8 MG tablet Take 1 tablet (8 mg total) by mouth at bedtime.    Marland Kitchen labetalol (NORMODYNE) 200 MG tablet Take 200 mg by mouth 2 (two) times daily.     Marland Kitchen lactulose (CHRONULAC) 10 GM/15ML solution Take 20 g by mouth daily as needed for mild constipation.    Marland Kitchen losartan (COZAAR) 100 MG tablet Take 100 mg by mouth at bedtime.     . meclizine (ANTIVERT) 25 MG tablet Take 1 tablet (25 mg total) by mouth 2 (two) times daily as needed for dizziness. 30 tablet 0  . SENSIPAR 30 MG tablet Take  30 mg by mouth every Monday, Wednesday, and Friday with hemodialysis.     Marland Kitchen simvastatin (ZOCOR) 20 MG tablet Take 1 tablet (20 mg total) by mouth at bedtime. 30 tablet 0   No facility-administered medications prior to visit.     PAST MEDICAL HISTORY: Past Medical History:  Diagnosis Date  . Adenomatous polyp of colon 10/2010, 2006, 2015  . Anemia in CKD (chronic kidney disease) 11/07/2012   s/p blood transfusion.   . Arthritis   . CHF  (congestive heart failure) ([place])   . Constipation   . Depression with anxiety   . Diverticula, colon   . 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  Had deceased donor kidney transplant in 1996.  Had some early rejection then stable function for years, then had slow decline of function and went back on hemodialysis in 2012.  Gets HD TTS schedule at [facility] on [facility] still using L forearm AVF.     Marland Kitchen GERD (gastroesophageal reflux disease)   . Headache   . Hyperlipidemia   . Hypertension   . Neuromuscular disorder (HCC)    neuropathy hand and legs  . Osteoporosis   . Pneumonia   . 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 Stroke [facility]) 11/2015   TIA  . Weight loss, unintentional     PAST SURGICAL HISTORY: Past Surgical History:  Procedure Laterality Date  . AV FISTULA PLACEMENT     for dialysis  . AV FISTULA PLACEMENT Left 11/22/2015   Procedure: ARTERIOVENOUS (AV) FISTULA CREATION-LEFT BRACHIOCEPHALIC;  Surgeon: Serafina Mitchell, MD;  Location: [place];  Service: Vascular;  Laterality: Left;  . BACK SURGERY    . CERVICAL FUSION    . CHOLECYSTECTOMY  12/02/2012   Procedure: LAPAROSCOPIC CHOLECYSTECTOMY WITH INTRAOPERATIVE CHOLANGIOGRAM;  Surgeon: Edward Jolly, MD;  Location: [place];  Service: General;  Laterality: N/A;  . EYE SURGERY Bilateral    cataract surgery  . HEMATOMA EVACUATION Left 12/24/2016   Procedure: EVACUATION HEMATOMA LEFT UPPER ARM;  Surgeon: Waynetta Sandy, MD;  Location: [place];  Service: Vascular;  Laterality: Left;  . I&D EXTREMITY Left 12/31/2016   Procedure: IRRIGATION AND DEBRIDEMENT EXTREMITY;  Surgeon: Angelia Mould, MD;  Location: [place];  Service: Vascular;  Laterality: Left;  . INSERTION OF DIALYSIS CATHETER Right 12/24/2016   Procedure: INSERTION OF DIALYSIS CATHETER;  Surgeon: Waynetta Sandy, MD;  Location: [place];   Service: Vascular;  Laterality: Right;  . INSERTION OF DIALYSIS CATHETER Right 02/04/2017   Procedure: INSERTION OF DIALYSIS CATHETER;  Surgeon: Waynetta Sandy, MD;  Location: [place];  Service: Vascular;  Laterality: Right;  . KIDNEY TRANSPLANT  1996  . PERIPHERAL VASCULAR CATHETERIZATION Left 10/23/2016   Procedure: Fistulagram;  Surgeon: Elam Dutch, MD;  Location: [place] [facility];  Service: Cardiovascular;  Laterality: Left;  . RESECTION OF ARTERIOVENOUS FISTULA ANEURYSM Left 11/22/2015   Procedure: RESECTION OF LEFT RADIOCEPHALIC FISTULA ANEURYSM ;  Surgeon: Serafina Mitchell, MD;  Location: [place];  Service: Vascular;  Laterality: Left;  . REVISON OF ARTERIOVENOUS FISTULA Left 12/22/2016   Procedure: REVISON OF LEFT ARTERIOVENOUS FISTULA;  Surgeon: Waynetta Sandy, MD;  Location: [place];  Service: Vascular;  Laterality: Left;  . REVISON OF ARTERIOVENOUS FISTULA Left 02/04/2017   Procedure: REVISON OF LEFT UPPER ARM ARTERIOVENOUS FISTULA;  Surgeon: Waynetta Sandy,  MD;  Location: [facility];  Service: Vascular;  Laterality: Left;    FAMILY HISTORY: Family History  Problem Relation Age of Onset  . Colon cancer Brother   . Cancer Brother   . Coronary artery disease Mother 85  . Hyperlipidemia Mother   . Hypertension Mother   . Stroke Maternal Aunt   . Esophageal cancer Neg Hx   . Stomach cancer Neg Hx   . Rectal cancer Neg Hx     SOCIAL HISTORY: Social History   Social History  . Marital status: Widowed    Spouse name: N/A  . Number of children: 2  . Years of education: N/A   Occupational History  . Not on file.   Social History Main Topics  . Smoking status: Former Smoker    Types: Cigarettes    Quit date: 12/31/1991  . Smokeless tobacco: Never Used  . Alcohol use No  . Drug use: No  . Sexual activity: No   Other Topics Concern  . Not on file   Social History Narrative  . No narrative on file     PHYSICAL EXAM  Vitals:   05/04/17 1126   BP: (!) 160/90  Pulse: 77  Weight: 119 lb 9.6 oz (54.3 kg)  Height: 5\' 2"  (1.575 m)   Body mass index is 21.88 kg/m.  Generalized: Well developed, Frail appearing female in no acute distress  Head: normocephalic and atraumatic,. Oropharynx benign  Neck: Supple, no carotid bruits  Cardiac: Regular rate rhythm, no murmur  Musculoskeletal: No deformity   Neurological examination   Mentation: Alert oriented to time, place, history taking. Attention span and concentration appropriate. Recent and remote memory intact.  Follows all commands speech and language fluent.   Cranial nerve II-XII: Fundoscopic exam not done.Pupils were equal round reactive to light extraocular movements were full, visual field were full on confrontational test. Facial sensation and strength were normal. hearing was intact to finger rubbing bilaterally. Uvula tongue midline. head turning and shoulder shrug were normal and symmetric.Tongue protrusion into cheek strength was normal. Motor: normal bulk and tone, full strength in the BUE, BLE, fine finger movements normal, no pronator drift. No focal weakness Sensory: normal and symmetric to light touch, pinprick, and  Vibration, in the upper and lower extremities  Coordination: finger-nose-finger, heel-to-shin bilaterally, no dysmetria, no tremor Reflexes: Symmetric upper and lower plantar responses were flexor bilaterally. Gait and Station: Rising up from seated position without assistance, normal stance,  moderate stride, good arm swing, smooth turning, able to perform tiptoe, and heel walking without difficulty. Tandem gait is mildly unsteady  DIAGNOSTIC DATA (LABS, IMAGING, TESTING) - I reviewed patient records, labs, notes, testing and imaging myself where available.  Lab Results  Component Value Date   WBC 5.4 02/26/2017   HGB 10.4 (L) 02/26/2017   HCT 33.3 (L) 02/26/2017   MCV 90.0 02/26/2017   PLT 163 02/26/2017      Component Value Date/Time   NA 133  (L) 02/26/2017 1319   K 4.7 02/26/2017 1319   CL 93 (L) 02/26/2017 1319   CO2 25 02/26/2017 1319   GLUCOSE 102 (H) 02/26/2017 1319   BUN 43 (H) 02/26/2017 1319   CREATININE 5.53 (H) 02/26/2017 1319   CALCIUM 9.3 02/26/2017 1319   CALCIUM 9.6 08/03/2011 1005   PROT 7.3 02/23/2017 1538   ALBUMIN 2.9 (L) 02/26/2017 1319   AST 26 02/23/2017 1538   ALT 11 (L) 02/23/2017 1538   ALKPHOS 87 02/23/2017 1538   BILITOT  0.8 02/23/2017 1538   GFRNONAA 7 (L) 02/26/2017 1319   GFRAA 8 (L) 02/26/2017 1319   Lab Results  Component Value Date   CHOL 171 02/24/2017   HDL 52 02/24/2017   LDLCALC 101 (H) 02/24/2017   TRIG 92 02/24/2017   CHOLHDL 3.3 02/24/2017   Lab Results  Component Value Date   HGBA1C 4.6 (L) 02/24/2017    Lab Results  Component Value Date   TSH 2.828 02/24/2017      ASSESSMENT AND PLAN  66 y.o. year old female  has a past medical history of  Anemia in CKD (chronic kidney disease) (11/07/2012); Arthritis; CHF (congestive heart failure) ([place]);  Depression with anxiety;  (end stage renal disease) ([place]) (11/07/2012);  Headache; Hyperlipidemia; Hypertension; Neuromuscular disorder ([place]); Stroke [facility]) (11/2015); and Weight loss, unintentional. Here for hospital  follow-up for right ACA small infarct.The patient is a current patient of Dr. Erlinda Hong  who is out of the office today . This note is sent to the work in doctor.     PLAN;Stressed the importance of management of risk factors to prevent further stroke Continue Plavix for secondary stroke prevention Maintain strict control of hypertension with blood pressure goal below 130/90, today's reading 160/80 continue antihypertensive medications Control of diabetes with hemoglobin A1c below 6.5 followed by primary care most recent hemoglobin A1c 4.6  continue diabetic medications Cholesterol with LDL cholesterol less than 70, followed by primary care,   continue statin drugs Exercise by walking, encouraged to get back into Silver  sneakers slowly increase , eat healthy diet with whole grains,  fresh fruits and vegetables no salt Follow up in 6 months Discussed risk for recurrent stroke/ TIA and answered additional questions This was a visit requiring 30  minutes and medical decision making of high complexity with extensive review of history, hospital chart, counseling and answering questions Dennie Bible, [facility], [facility], APRN  [facility] [address] [place], [place] [address] [phone]

## 2017-05-05 DIAGNOSIS — N2581 Secondary hyperparathyroidism of renal origin: Secondary | ICD-10-CM | POA: Diagnosis not present

## 2017-05-05 DIAGNOSIS — N186 End stage renal disease: Secondary | ICD-10-CM | POA: Diagnosis not present

## 2017-05-05 DIAGNOSIS — D631 Anemia in chronic kidney disease: Secondary | ICD-10-CM | POA: Diagnosis not present

## 2017-05-06 DIAGNOSIS — I132 Hypertensive heart and chronic kidney disease with heart failure and with stage 5 chronic kidney disease, or end stage renal disease: Secondary | ICD-10-CM | POA: Diagnosis not present

## 2017-05-06 DIAGNOSIS — I5020 Unspecified systolic (congestive) heart failure: Secondary | ICD-10-CM | POA: Diagnosis not present

## 2017-05-07 DIAGNOSIS — N186 End stage renal disease: Secondary | ICD-10-CM | POA: Diagnosis not present

## 2017-05-07 DIAGNOSIS — N2581 Secondary hyperparathyroidism of renal origin: Secondary | ICD-10-CM | POA: Diagnosis not present

## 2017-05-07 DIAGNOSIS — D631 Anemia in chronic kidney disease: Secondary | ICD-10-CM | POA: Diagnosis not present

## 2017-05-10 DIAGNOSIS — D631 Anemia in chronic kidney disease: Secondary | ICD-10-CM | POA: Diagnosis not present

## 2017-05-10 DIAGNOSIS — N186 End stage renal disease: Secondary | ICD-10-CM | POA: Diagnosis not present

## 2017-05-10 DIAGNOSIS — N2581 Secondary hyperparathyroidism of renal origin: Secondary | ICD-10-CM | POA: Diagnosis not present

## 2017-05-11 NOTE — Progress Notes (Signed)
Personally  participated in, made any corrections needed, and agree with history, physical, neuro exam,assessment and plan as stated above.    Antonia Ahern, MD [facility] 

## 2017-05-12 ENCOUNTER — Encounter (HOSPITAL_COMMUNITY): Payer: Self-pay | Admitting: Emergency Medicine

## 2017-05-12 ENCOUNTER — Emergency Department (HOSPITAL_COMMUNITY)
Admission: EM | Admit: 2017-05-12 | Discharge: 2017-05-12 | Disposition: A | Discharge status: Home / Self Care | Payer: Medicare Other | Attending: Emergency Medicine | Admitting: Emergency Medicine

## 2017-05-12 ENCOUNTER — Emergency Department (HOSPITAL_COMMUNITY): Payer: Medicare Other

## 2017-05-12 DIAGNOSIS — R0602 Shortness of breath: Secondary | ICD-10-CM

## 2017-05-12 DIAGNOSIS — I2510 Atherosclerotic heart disease of native coronary artery without angina pectoris: Secondary | ICD-10-CM | POA: Insufficient documentation

## 2017-05-12 DIAGNOSIS — N186 End stage renal disease: Secondary | ICD-10-CM | POA: Diagnosis not present

## 2017-05-12 DIAGNOSIS — R4182 Altered mental status, unspecified: Secondary | ICD-10-CM | POA: Diagnosis not present

## 2017-05-12 DIAGNOSIS — R7989 Other specified abnormal findings of blood chemistry: Secondary | ICD-10-CM | POA: Diagnosis not present

## 2017-05-12 DIAGNOSIS — I132 Hypertensive heart and chronic kidney disease with heart failure and with stage 5 chronic kidney disease, or end stage renal disease: Secondary | ICD-10-CM | POA: Diagnosis not present

## 2017-05-12 DIAGNOSIS — Z79899 Other long term (current) drug therapy: Secondary | ICD-10-CM | POA: Insufficient documentation

## 2017-05-12 DIAGNOSIS — Z87891 Personal history of nicotine dependence: Secondary | ICD-10-CM | POA: Diagnosis not present

## 2017-05-12 DIAGNOSIS — D631 Anemia in chronic kidney disease: Secondary | ICD-10-CM | POA: Diagnosis not present

## 2017-05-12 DIAGNOSIS — I509 Heart failure, unspecified: Secondary | ICD-10-CM | POA: Insufficient documentation

## 2017-05-12 DIAGNOSIS — I160 Hypertensive urgency: Secondary | ICD-10-CM

## 2017-05-12 DIAGNOSIS — I10 Essential (primary) hypertension: Secondary | ICD-10-CM | POA: Diagnosis not present

## 2017-05-12 DIAGNOSIS — N2581 Secondary hyperparathyroidism of renal origin: Secondary | ICD-10-CM | POA: Diagnosis not present

## 2017-05-12 LAB — COMPREHENSIVE METABOLIC PANEL
ALK PHOS: 74 U/L (ref 38–126)
ALT: 10 U/L — AB (ref 14–54)
AST: 25 U/L (ref 15–41)
Albumin: 3.3 g/dL — ABNORMAL LOW (ref 3.5–5.0)
Anion gap: 16 — ABNORMAL HIGH (ref 5–15)
BILIRUBIN TOTAL: 0.9 mg/dL (ref 0.3–1.2)
BUN: 49 mg/dL — AB (ref 6–20)
CO2: 28 mmol/L (ref 22–32)
Calcium: 9.3 mg/dL (ref 8.9–10.3)
Chloride: 91 mmol/L — ABNORMAL LOW (ref 101–111)
Creatinine, Ser: 7.55 mg/dL — ABNORMAL HIGH (ref 0.44–1.00)
GFR calc Af Amer: 6 mL/min — ABNORMAL LOW (ref >60)
GFR calc non Af Amer: 5 mL/min — ABNORMAL LOW (ref >60)
GLUCOSE: 88 mg/dL (ref 65–99)
Potassium: 3.7 mmol/L (ref 3.5–5.1)
Sodium: 135 mmol/L (ref 135–145)
TOTAL PROTEIN: 7 g/dL (ref 6.5–8.1)

## 2017-05-12 LAB — CBC WITH DIFFERENTIAL/PLATELET
Basophils Absolute: 0 10*3/uL (ref 0.0–0.1)
Basophils Relative: 0 %
EOS ABS: 0.2 10*3/uL (ref 0.0–0.7)
EOS PCT: 4 %
HCT: 30.6 % — ABNORMAL LOW (ref 36.0–46.0)
Hemoglobin: 9.8 g/dL — ABNORMAL LOW (ref 12.0–15.0)
LYMPHS ABS: 1.6 10*3/uL (ref 0.7–4.0)
LYMPHS PCT: 33 %
MCH: 28.7 pg (ref 26.0–34.0)
MCHC: 32 g/dL (ref 30.0–36.0)
MCV: 89.7 fL (ref 78.0–100.0)
MONOS PCT: 11 %
Monocytes Absolute: 0.5 10*3/uL (ref 0.1–1.0)
Neutro Abs: 2.5 10*3/uL (ref 1.7–7.7)
Neutrophils Relative %: 52 %
PLATELETS: 183 10*3/uL (ref 150–400)
RBC: 3.41 MIL/uL — ABNORMAL LOW (ref 3.87–5.11)
RDW: 18.3 % — ABNORMAL HIGH (ref 11.5–15.5)
WBC: 4.8 10*3/uL (ref 4.0–10.5)

## 2017-05-12 LAB — TROPONIN I: Troponin I: 0.07 ng/mL (ref <0.03)

## 2017-05-12 LAB — BRAIN NATRIURETIC PEPTIDE: B Natriuretic Peptide: 3520.2 pg/mL — ABNORMAL HIGH (ref 0.0–100.0)

## 2017-05-12 NOTE — ED Notes (Signed)
CRITICAL VALUE ALERT  Critical Value: troponin 0.07  Date & Time Notified:  05/12/2017 1048   Provider Notified: Dr. Vanita Panda [id number]  Orders Received/Actions taken: No further orders at this time

## 2017-05-12 NOTE — Discharge Instructions (Signed)
As discussed, today's evaluation has demonstrated the importance of going to dialysis now. It is important that you attend that session, and return here if you have any complications. You may also return here for any concerning changes in your condition.

## 2017-05-12 NOTE — ED Triage Notes (Signed)
Pt sts increased SOB starting early this am; pt sts due for dialysis today

## 2017-05-12 NOTE — ED Provider Notes (Signed)
[facility] DEPT Provider Note   CSN: [id number] Arrival date & time: 05/12/17  7341     History   Chief Complaint Chief Complaint  Patient presents with  . Shortness of Breath    HPI [name] is a 66 y.o. female.  HPI Patient presents with concern of dyspnea Patient notes last dialysis was 2 days ago, and today she is scheduled for dialysis in 2 hours. She notes that today upon awakening she felt dyspneic, without pain, without lightheadedness Patient also complains of ongoing confusion, and it is unclear if this is new, or old. Patient acknowledges history of prior stroke, prior bleed, states that she takes Plavix. No other recent medication changes, diet changes, activity changes.    Past Medical History:  Diagnosis Date  . Adenomatous polyp of colon 10/2010, 2006, 2015  . Anemia in CKD (chronic kidney disease) 11/07/2012   s/p blood transfusion.   . Arthritis   . CHF (congestive heart failure) ([place])   . Constipation   . Depression with anxiety   . Diverticula, colon   . 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  Had deceased donor kidney transplant in 1996.  Had some early rejection then stable function for years, then had slow decline of function and went back on hemodialysis in 2012.  Gets HD TTS schedule at [facility] still using L forearm AVF.     Marland Kitchen GERD (gastroesophageal reflux disease)   . Headache   . Hyperlipidemia   . Hypertension   . Neuromuscular disorder (HCC)    neuropathy hand and legs  . Osteoporosis   . Pneumonia   . 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 Stroke [facility]) 11/2015   TIA  . Weight loss, unintentional     Patient Active Problem List   Diagnosis Date Noted  . Atrial fibrillation ([place]) 03/23/2017  . Prolonged Q-T interval on ECG 02/24/2017  . Hypokalemia 02/24/2017  . Acute ischemic stroke ([place])  02/23/2017  . Malnutrition of moderate degree 12/24/2016  . Problem with dialysis access ([place]) 12/21/2016  . Acute ischemic colitis ([place]) 05/16/2016  . Colitis 05/15/2016  . Rectal bleeding 05/15/2016  . Neurologic abnormality 11/19/2015  . Altered mental state 11/19/2015  . Hyperkalemia 11/19/2015  . Anxiety 11/19/2015  . Insomnia 11/19/2015  . ESRD (end stage renal disease) on dialysis ([place])   . Gait instability   . Stroke-like symptom 11/16/2015  . Vestibular neuritis 11/16/2015  . Stroke (cerebrum) ([place]) 11/16/2015  . Dizziness 05/09/2015  . Ataxia 05/09/2015  . H/O: CVA (cerebrovascular accident) 05/09/2015  . Left facial numbness 05/09/2015  . Left leg numbness 05/09/2015  . Hyperlipidemia   . CHF (congestive heart failure) ([place])   . Congestive heart disease ([place])   . Shortness of breath 04/01/2013  . Volume overload 04/01/2013  . HTN (hypertension) 04/01/2013  . Cholecystitis, acute 11/07/2012  . ESRD (end stage renal disease) ([place]) 11/07/2012  . GERD (gastroesophageal reflux disease) 11/07/2012  . Anemia in CKD (chronic kidney disease) 11/07/2012    Past Surgical History:  Procedure Laterality Date  . AV FISTULA PLACEMENT     for dialysis  . AV FISTULA PLACEMENT Left 11/22/2015   Procedure: ARTERIOVENOUS (AV) FISTULA CREATION-LEFT BRACHIOCEPHALIC;  Surgeon: Serafina Mitchell, MD;  Location: [place];  Service: Vascular;  Laterality: Left;  . BACK SURGERY    .  CERVICAL FUSION    . CHOLECYSTECTOMY  12/02/2012   Procedure: LAPAROSCOPIC CHOLECYSTECTOMY WITH INTRAOPERATIVE CHOLANGIOGRAM;  Surgeon: Edward Jolly, MD;  Location: [place];  Service: General;  Laterality: N/A;  . EYE SURGERY Bilateral    cataract surgery  . HEMATOMA EVACUATION Left 12/24/2016   Procedure: EVACUATION HEMATOMA LEFT UPPER ARM;  Surgeon: Waynetta Sandy, MD;  Location: [place];  Service: Vascular;  Laterality: Left;  . I&D EXTREMITY Left 12/31/2016   Procedure: IRRIGATION AND DEBRIDEMENT  EXTREMITY;  Surgeon: Angelia Mould, MD;  Location: [place];  Service: Vascular;  Laterality: Left;  . INSERTION OF DIALYSIS CATHETER Right 12/24/2016   Procedure: INSERTION OF DIALYSIS CATHETER;  Surgeon: Waynetta Sandy, MD;  Location: [place];  Service: Vascular;  Laterality: Right;  . INSERTION OF DIALYSIS CATHETER Right 02/04/2017   Procedure: INSERTION OF DIALYSIS CATHETER;  Surgeon: Waynetta Sandy, MD;  Location: [place];  Service: Vascular;  Laterality: Right;  . KIDNEY TRANSPLANT  1996  . PERIPHERAL VASCULAR CATHETERIZATION Left 10/23/2016   Procedure: Fistulagram;  Surgeon: Elam Dutch, MD;  Location: [place] [facility];  Service: Cardiovascular;  Laterality: Left;  . RESECTION OF ARTERIOVENOUS FISTULA ANEURYSM Left 11/22/2015   Procedure: RESECTION OF LEFT RADIOCEPHALIC FISTULA ANEURYSM ;  Surgeon: Serafina Mitchell, MD;  Location: [place];  Service: Vascular;  Laterality: Left;  . REVISON OF ARTERIOVENOUS FISTULA Left 12/22/2016   Procedure: REVISON OF LEFT ARTERIOVENOUS FISTULA;  Surgeon: Waynetta Sandy, MD;  Location: [place];  Service: Vascular;  Laterality: Left;  . REVISON OF ARTERIOVENOUS FISTULA Left 02/04/2017   Procedure: REVISON OF LEFT UPPER ARM ARTERIOVENOUS FISTULA;  Surgeon: Waynetta Sandy, MD;  Location: [place];  Service: Vascular;  Laterality: Left;    OB History    No data available       Home Medications    Prior to Admission medications   Medication Sig Start Date End Date Taking? Authorizing Provider  acetaminophen (TYLENOL) 325 MG tablet Take 650 mg by mouth every 6 (six) hours as needed.    [provider]  allopurinol (ZYLOPRIM) 300 MG tablet Take 150 mg by mouth at bedtime.    [provider]  ALPRAZolam Duanne Moron) 0.25 MG tablet Take 0.25 mg by mouth 2 (two) times daily as needed for anxiety.     [provider]  AURYXIA 1 GM 210 MG(Fe) tablet Take 420 mg by mouth 3 (three) times daily with  meals.  12/22/16   [provider]  B Complex-C-Folic Acid (DIALYVITE PO) Take 1 tablet by mouth daily.    [provider]  clopidogrel (PLAVIX) 75 MG tablet Take 1 tablet (75 mg total) by mouth daily. 02/26/17   Velvet Bathe, MD  doxazosin (CARDURA) 8 MG tablet Take 1 tablet (8 mg total) by mouth at bedtime. 12/26/16   Alvia Grove, PA-C  labetalol (NORMODYNE) 200 MG tablet Take 200 mg by mouth 2 (two) times daily.     [provider]  lactulose (CHRONULAC) 10 GM/15ML solution Take 20 g by mouth daily as needed for mild constipation.    [provider]  lanthanum (FOSRENOL) 1000 MG chewable tablet Chew 1,000 mg by mouth 2 (two) times daily with a meal.    [provider]  losartan (COZAAR) 100 MG tablet Take 100 mg by mouth at bedtime.     [provider]  meclizine (ANTIVERT) 25 MG tablet Take 1 tablet (25 mg total) by mouth 2 (two)  times daily as needed for dizziness. 06/24/16   Merryl Hacker, MD  SENSIPAR 30 MG tablet Take 30 mg by mouth every Monday, Wednesday, and Friday with hemodialysis.  08/20/16   [provider]  simvastatin (ZOCOR) 20 MG tablet Take 1 tablet (20 mg total) by mouth at bedtime. 02/26/17   Velvet Bathe, MD  zolpidem (AMBIEN) 10 MG tablet 1 tablet    [provider]    Family History Family History  Problem Relation Age of Onset  . Colon cancer Brother   . Cancer Brother   . Coronary artery disease Mother 74  . Hyperlipidemia Mother   . Hypertension Mother   . Stroke Maternal Aunt   . Esophageal cancer Neg Hx   . Stomach cancer Neg Hx   . Rectal cancer Neg Hx     Social History Social History  Substance Use Topics  . Smoking status: Former Smoker    Types: Cigarettes    Quit date: 12/31/1991  . Smokeless tobacco: Never Used  . Alcohol use No     Allergies   Sulfa antibiotics and Adhesive [tape]   Review of Systems Review of Systems  Constitutional:       Per HPI, otherwise  negative  HENT:       Per HPI, otherwise negative  Respiratory:       Per HPI, otherwise negative  Cardiovascular:       Per HPI, otherwise negative  Gastrointestinal: Negative for vomiting.  Endocrine:       Negative aside from HPI  Genitourinary:       Neg aside from HPI   Musculoskeletal:       Per HPI, otherwise negative  Skin: Negative.   Allergic/Immunologic: Positive for immunocompromised state.  Neurological: Positive for headaches. Negative for syncope.  Psychiatric/Behavioral: Positive for confusion.     Physical Exam Updated Vital Signs BP (!) 178/104   Pulse 90   Temp 98.5 F (36.9 C) (Oral)   Resp 14   SpO2 98%   Physical Exam  Constitutional: She is oriented to person, place, and time. She appears well-developed and well-nourished. No distress.  HENT:  Head: Normocephalic and atraumatic.  Eyes: Conjunctivae and EOM are normal.  Cardiovascular: Normal rate and regular rhythm.   Pulmonary/Chest: Effort normal and breath sounds normal. No stridor. No respiratory distress.  Abdominal: She exhibits no distension.  Musculoskeletal: She exhibits no edema.  L UE fistula, w palpable thrill  Neurological: She is alert and oriented to person, place, and time. No cranial nerve deficit.  Skin: Skin is warm and dry.  Psychiatric: She exhibits abnormal remote memory.  Confused, repetitive, but oriented x3   Nursing note and vitals reviewed.    ED Treatments / Results  Labs (all labs ordered are listed, but only abnormal results are displayed) Labs Reviewed  COMPREHENSIVE METABOLIC PANEL - Abnormal; Notable for the following:       Result Value   Chloride 91 (*)    BUN 49 (*)    Creatinine, Ser 7.55 (*)    Albumin 3.3 (*)    ALT 10 (*)    GFR calc non Af Amer 5 (*)    GFR calc Af Amer 6 (*)    Anion gap 16 (*)    All other components within normal limits  BRAIN NATRIURETIC PEPTIDE - Abnormal; Notable for the following:    B Natriuretic Peptide 3,520.2 (*)     All other components within normal limits  TROPONIN I -  Abnormal; Notable for the following:    Troponin I 0.07 (*)    All other components within normal limits  CBC WITH DIFFERENTIAL/PLATELET - Abnormal; Notable for the following:    RBC 3.41 (*)    Hemoglobin 9.8 (*)    HCT 30.6 (*)    RDW 18.3 (*)    All other components within normal limits    EKG  EKG Interpretation  Date/Time:  Wednesday May 12 2017 08:57:47 EDT Ventricular Rate:  93 PR Interval:  176 QRS Duration: 78 QT Interval:  406 QTC Calculation: 504 R Axis:   92 Text Interpretation:  Normal sinus rhythm Rightward axis ST-t wave abnormality Artifact Baseline wander Abnormal ekg Confirmed by Carmin Muskrat [phone]) on 05/12/2017 9:47:09 AM       Radiology Dg Chest 2 View  Result Date: 05/12/2017 CLINICAL DATA:  Worsening shortness of breath beginning this morning. Patient due for dialysis today. EXAM: CHEST  2 VIEW COMPARISON:  CT chest 07/31/2014.  PA and lateral chest 02/23/2017. FINDINGS: There is marked cardiomegaly without edema. Lumbar the lungs are clear. No pneumothorax or pleural effusion. Aortic atherosclerosis noted. IMPRESSION: Cardiomegaly without acute disease. Atherosclerosis. Electronically Signed   By: Inge Rise M.D.   On: 05/12/2017 09:50    Procedures Procedures (including critical care time)  Medications Ordered in ED Medications - No data to display   Initial Impression / Assessment and Plan / ED Course  I have reviewed the triage vital signs and the nursing notes.  Pertinent labs & imaging results that were available during my care of the patient were reviewed by me and considered in my medical decision making (see chart for details).  11:30 AM Patient awake and alert, calm. She is hypertensive, but not visibly dyspneic. We had a lengthy conversation about all findings including elevated BNP, need for dialysis. Patient is scheduled for dialysis at her outpatient center within  2 hours, expressed a preference for dialysis there, rather than waiting here for dialysis, as an inpatient. We discussed the importance of attending that dialysis session. Though the patient does have borderline elevated troponin, elevated BNP, she has no substantial electrode abnormalities, is awake and alert, is appropriate to proceed to outpatient dialysis, scheduled to begin within the next 2 hours.   Final Clinical Impressions(s) / ED Diagnoses  Dyspnea Elevated BNP Hypertensive urgency    Carmin Muskrat, MD 05/12/17 1131

## 2017-05-13 DIAGNOSIS — N186 End stage renal disease: Secondary | ICD-10-CM | POA: Diagnosis not present

## 2017-05-13 DIAGNOSIS — T8612 Kidney transplant failure: Secondary | ICD-10-CM | POA: Diagnosis not present

## 2017-05-13 DIAGNOSIS — I5020 Unspecified systolic (congestive) heart failure: Secondary | ICD-10-CM | POA: Diagnosis not present

## 2017-05-13 DIAGNOSIS — I132 Hypertensive heart and chronic kidney disease with heart failure and with stage 5 chronic kidney disease, or end stage renal disease: Secondary | ICD-10-CM | POA: Diagnosis not present

## 2017-05-13 DIAGNOSIS — Z992 Dependence on renal dialysis: Secondary | ICD-10-CM | POA: Diagnosis not present

## 2017-05-14 DIAGNOSIS — N2581 Secondary hyperparathyroidism of renal origin: Secondary | ICD-10-CM | POA: Diagnosis not present

## 2017-05-14 DIAGNOSIS — D631 Anemia in chronic kidney disease: Secondary | ICD-10-CM | POA: Diagnosis not present

## 2017-05-14 DIAGNOSIS — N186 End stage renal disease: Secondary | ICD-10-CM | POA: Diagnosis not present

## 2017-05-14 NOTE — Addendum Note (Signed)
Addendum  created 05/14/17 1155 by Jonah Gingras, MD   Sign clinical note    

## 2017-05-14 NOTE — Addendum Note (Signed)
Addendum  created 05/14/17 0936 by Rica Koyanagi, MD   Sign clinical note

## 2017-05-17 DIAGNOSIS — N186 End stage renal disease: Secondary | ICD-10-CM | POA: Diagnosis not present

## 2017-05-17 DIAGNOSIS — D631 Anemia in chronic kidney disease: Secondary | ICD-10-CM | POA: Diagnosis not present

## 2017-05-17 DIAGNOSIS — N2581 Secondary hyperparathyroidism of renal origin: Secondary | ICD-10-CM | POA: Diagnosis not present

## 2017-05-19 DIAGNOSIS — D631 Anemia in chronic kidney disease: Secondary | ICD-10-CM | POA: Diagnosis not present

## 2017-05-19 DIAGNOSIS — N186 End stage renal disease: Secondary | ICD-10-CM | POA: Diagnosis not present

## 2017-05-19 DIAGNOSIS — N2581 Secondary hyperparathyroidism of renal origin: Secondary | ICD-10-CM | POA: Diagnosis not present

## 2017-05-20 DIAGNOSIS — I132 Hypertensive heart and chronic kidney disease with heart failure and with stage 5 chronic kidney disease, or end stage renal disease: Secondary | ICD-10-CM | POA: Diagnosis not present

## 2017-05-20 DIAGNOSIS — I5020 Unspecified systolic (congestive) heart failure: Secondary | ICD-10-CM | POA: Diagnosis not present

## 2017-05-21 DIAGNOSIS — D631 Anemia in chronic kidney disease: Secondary | ICD-10-CM | POA: Diagnosis not present

## 2017-05-21 DIAGNOSIS — N186 End stage renal disease: Secondary | ICD-10-CM | POA: Diagnosis not present

## 2017-05-21 DIAGNOSIS — N2581 Secondary hyperparathyroidism of renal origin: Secondary | ICD-10-CM | POA: Diagnosis not present

## 2017-05-24 DIAGNOSIS — D631 Anemia in chronic kidney disease: Secondary | ICD-10-CM | POA: Diagnosis not present

## 2017-05-24 DIAGNOSIS — N2581 Secondary hyperparathyroidism of renal origin: Secondary | ICD-10-CM | POA: Diagnosis not present

## 2017-05-24 DIAGNOSIS — N186 End stage renal disease: Secondary | ICD-10-CM | POA: Diagnosis not present

## 2017-05-26 DIAGNOSIS — D631 Anemia in chronic kidney disease: Secondary | ICD-10-CM | POA: Diagnosis not present

## 2017-05-26 DIAGNOSIS — N186 End stage renal disease: Secondary | ICD-10-CM | POA: Diagnosis not present

## 2017-05-26 DIAGNOSIS — N2581 Secondary hyperparathyroidism of renal origin: Secondary | ICD-10-CM | POA: Diagnosis not present

## 2017-05-27 DIAGNOSIS — I132 Hypertensive heart and chronic kidney disease with heart failure and with stage 5 chronic kidney disease, or end stage renal disease: Secondary | ICD-10-CM | POA: Diagnosis not present

## 2017-05-27 DIAGNOSIS — I5020 Unspecified systolic (congestive) heart failure: Secondary | ICD-10-CM | POA: Diagnosis not present

## 2017-05-28 DIAGNOSIS — N186 End stage renal disease: Secondary | ICD-10-CM | POA: Diagnosis not present

## 2017-05-28 DIAGNOSIS — N2581 Secondary hyperparathyroidism of renal origin: Secondary | ICD-10-CM | POA: Diagnosis not present

## 2017-05-28 DIAGNOSIS — D631 Anemia in chronic kidney disease: Secondary | ICD-10-CM | POA: Diagnosis not present

## 2017-05-31 DIAGNOSIS — N2581 Secondary hyperparathyroidism of renal origin: Secondary | ICD-10-CM | POA: Diagnosis not present

## 2017-05-31 DIAGNOSIS — D631 Anemia in chronic kidney disease: Secondary | ICD-10-CM | POA: Diagnosis not present

## 2017-05-31 DIAGNOSIS — N186 End stage renal disease: Secondary | ICD-10-CM | POA: Diagnosis not present

## 2017-06-02 DIAGNOSIS — N186 End stage renal disease: Secondary | ICD-10-CM | POA: Diagnosis not present

## 2017-06-02 DIAGNOSIS — N2581 Secondary hyperparathyroidism of renal origin: Secondary | ICD-10-CM | POA: Diagnosis not present

## 2017-06-02 DIAGNOSIS — D631 Anemia in chronic kidney disease: Secondary | ICD-10-CM | POA: Diagnosis not present

## 2017-06-03 DIAGNOSIS — I5020 Unspecified systolic (congestive) heart failure: Secondary | ICD-10-CM | POA: Diagnosis not present

## 2017-06-03 DIAGNOSIS — I132 Hypertensive heart and chronic kidney disease with heart failure and with stage 5 chronic kidney disease, or end stage renal disease: Secondary | ICD-10-CM | POA: Diagnosis not present

## 2017-06-03 DIAGNOSIS — Z1231 Encounter for screening mammogram for malignant neoplasm of breast: Secondary | ICD-10-CM | POA: Diagnosis not present

## 2017-06-04 DIAGNOSIS — N2581 Secondary hyperparathyroidism of renal origin: Secondary | ICD-10-CM | POA: Diagnosis not present

## 2017-06-04 DIAGNOSIS — N186 End stage renal disease: Secondary | ICD-10-CM | POA: Diagnosis not present

## 2017-06-04 DIAGNOSIS — D631 Anemia in chronic kidney disease: Secondary | ICD-10-CM | POA: Diagnosis not present

## 2017-06-07 DIAGNOSIS — D631 Anemia in chronic kidney disease: Secondary | ICD-10-CM | POA: Diagnosis not present

## 2017-06-07 DIAGNOSIS — N186 End stage renal disease: Secondary | ICD-10-CM | POA: Diagnosis not present

## 2017-06-07 DIAGNOSIS — N2581 Secondary hyperparathyroidism of renal origin: Secondary | ICD-10-CM | POA: Diagnosis not present

## 2017-06-09 DIAGNOSIS — D631 Anemia in chronic kidney disease: Secondary | ICD-10-CM | POA: Diagnosis not present

## 2017-06-09 DIAGNOSIS — N186 End stage renal disease: Secondary | ICD-10-CM | POA: Diagnosis not present

## 2017-06-09 DIAGNOSIS — N2581 Secondary hyperparathyroidism of renal origin: Secondary | ICD-10-CM | POA: Diagnosis not present

## 2017-06-10 DIAGNOSIS — Z124 Encounter for screening for malignant neoplasm of cervix: Secondary | ICD-10-CM | POA: Diagnosis not present

## 2017-06-10 DIAGNOSIS — Z6822 Body mass index (BMI) 22.0-22.9, adult: Secondary | ICD-10-CM | POA: Diagnosis not present

## 2017-06-10 DIAGNOSIS — Z01419 Encounter for gynecological examination (general) (routine) without abnormal findings: Secondary | ICD-10-CM | POA: Diagnosis not present

## 2017-06-10 DIAGNOSIS — M816 Localized osteoporosis [Lequesne]: Secondary | ICD-10-CM | POA: Diagnosis not present

## 2017-06-10 DIAGNOSIS — N958 Other specified menopausal and perimenopausal disorders: Secondary | ICD-10-CM | POA: Diagnosis not present

## 2017-06-11 DIAGNOSIS — D631 Anemia in chronic kidney disease: Secondary | ICD-10-CM | POA: Diagnosis not present

## 2017-06-11 DIAGNOSIS — N186 End stage renal disease: Secondary | ICD-10-CM | POA: Diagnosis not present

## 2017-06-11 DIAGNOSIS — N2581 Secondary hyperparathyroidism of renal origin: Secondary | ICD-10-CM | POA: Diagnosis not present

## 2017-06-12 DIAGNOSIS — T8612 Kidney transplant failure: Secondary | ICD-10-CM | POA: Diagnosis not present

## 2017-06-12 DIAGNOSIS — N186 End stage renal disease: Secondary | ICD-10-CM | POA: Diagnosis not present

## 2017-06-12 DIAGNOSIS — Z992 Dependence on renal dialysis: Secondary | ICD-10-CM | POA: Diagnosis not present

## 2017-06-14 DIAGNOSIS — D631 Anemia in chronic kidney disease: Secondary | ICD-10-CM | POA: Diagnosis not present

## 2017-06-14 DIAGNOSIS — N186 End stage renal disease: Secondary | ICD-10-CM | POA: Diagnosis not present

## 2017-06-14 DIAGNOSIS — N2581 Secondary hyperparathyroidism of renal origin: Secondary | ICD-10-CM | POA: Diagnosis not present

## 2017-06-16 DIAGNOSIS — N2581 Secondary hyperparathyroidism of renal origin: Secondary | ICD-10-CM | POA: Diagnosis not present

## 2017-06-16 DIAGNOSIS — D631 Anemia in chronic kidney disease: Secondary | ICD-10-CM | POA: Diagnosis not present

## 2017-06-16 DIAGNOSIS — N186 End stage renal disease: Secondary | ICD-10-CM | POA: Diagnosis not present

## 2017-06-18 DIAGNOSIS — D631 Anemia in chronic kidney disease: Secondary | ICD-10-CM | POA: Diagnosis not present

## 2017-06-18 DIAGNOSIS — N186 End stage renal disease: Secondary | ICD-10-CM | POA: Diagnosis not present

## 2017-06-18 DIAGNOSIS — N2581 Secondary hyperparathyroidism of renal origin: Secondary | ICD-10-CM | POA: Diagnosis not present

## 2017-06-21 DIAGNOSIS — N2581 Secondary hyperparathyroidism of renal origin: Secondary | ICD-10-CM | POA: Diagnosis not present

## 2017-06-21 DIAGNOSIS — N186 End stage renal disease: Secondary | ICD-10-CM | POA: Diagnosis not present

## 2017-06-21 DIAGNOSIS — D631 Anemia in chronic kidney disease: Secondary | ICD-10-CM | POA: Diagnosis not present

## 2017-06-23 DIAGNOSIS — N186 End stage renal disease: Secondary | ICD-10-CM | POA: Diagnosis not present

## 2017-06-23 DIAGNOSIS — N2581 Secondary hyperparathyroidism of renal origin: Secondary | ICD-10-CM | POA: Diagnosis not present

## 2017-06-23 DIAGNOSIS — D631 Anemia in chronic kidney disease: Secondary | ICD-10-CM | POA: Diagnosis not present

## 2017-06-24 DIAGNOSIS — H353131 Nonexudative age-related macular degeneration, bilateral, early dry stage: Secondary | ICD-10-CM | POA: Diagnosis not present

## 2017-06-24 DIAGNOSIS — H35363 Drusen (degenerative) of macula, bilateral: Secondary | ICD-10-CM | POA: Diagnosis not present

## 2017-06-24 DIAGNOSIS — H26493 Other secondary cataract, bilateral: Secondary | ICD-10-CM | POA: Diagnosis not present

## 2017-06-24 DIAGNOSIS — H43393 Other vitreous opacities, bilateral: Secondary | ICD-10-CM | POA: Diagnosis not present

## 2017-06-24 DIAGNOSIS — Z961 Presence of intraocular lens: Secondary | ICD-10-CM | POA: Diagnosis not present

## 2017-06-25 DIAGNOSIS — N2581 Secondary hyperparathyroidism of renal origin: Secondary | ICD-10-CM | POA: Diagnosis not present

## 2017-06-25 DIAGNOSIS — D631 Anemia in chronic kidney disease: Secondary | ICD-10-CM | POA: Diagnosis not present

## 2017-06-25 DIAGNOSIS — N186 End stage renal disease: Secondary | ICD-10-CM | POA: Diagnosis not present

## 2017-06-28 DIAGNOSIS — N186 End stage renal disease: Secondary | ICD-10-CM | POA: Diagnosis not present

## 2017-06-28 DIAGNOSIS — D631 Anemia in chronic kidney disease: Secondary | ICD-10-CM | POA: Diagnosis not present

## 2017-06-28 DIAGNOSIS — N2581 Secondary hyperparathyroidism of renal origin: Secondary | ICD-10-CM | POA: Diagnosis not present

## 2017-06-30 DIAGNOSIS — N186 End stage renal disease: Secondary | ICD-10-CM | POA: Diagnosis not present

## 2017-06-30 DIAGNOSIS — N2581 Secondary hyperparathyroidism of renal origin: Secondary | ICD-10-CM | POA: Diagnosis not present

## 2017-06-30 DIAGNOSIS — D631 Anemia in chronic kidney disease: Secondary | ICD-10-CM | POA: Diagnosis not present

## 2017-07-02 DIAGNOSIS — D631 Anemia in chronic kidney disease: Secondary | ICD-10-CM | POA: Diagnosis not present

## 2017-07-02 DIAGNOSIS — N2581 Secondary hyperparathyroidism of renal origin: Secondary | ICD-10-CM | POA: Diagnosis not present

## 2017-07-02 DIAGNOSIS — N186 End stage renal disease: Secondary | ICD-10-CM | POA: Diagnosis not present

## 2017-07-05 DIAGNOSIS — D631 Anemia in chronic kidney disease: Secondary | ICD-10-CM | POA: Diagnosis not present

## 2017-07-05 DIAGNOSIS — N2581 Secondary hyperparathyroidism of renal origin: Secondary | ICD-10-CM | POA: Diagnosis not present

## 2017-07-05 DIAGNOSIS — N186 End stage renal disease: Secondary | ICD-10-CM | POA: Diagnosis not present

## 2017-07-07 DIAGNOSIS — N2581 Secondary hyperparathyroidism of renal origin: Secondary | ICD-10-CM | POA: Diagnosis not present

## 2017-07-07 DIAGNOSIS — N186 End stage renal disease: Secondary | ICD-10-CM | POA: Diagnosis not present

## 2017-07-07 DIAGNOSIS — D631 Anemia in chronic kidney disease: Secondary | ICD-10-CM | POA: Diagnosis not present

## 2017-07-08 DIAGNOSIS — H26491 Other secondary cataract, right eye: Secondary | ICD-10-CM | POA: Diagnosis not present

## 2017-07-09 DIAGNOSIS — N2581 Secondary hyperparathyroidism of renal origin: Secondary | ICD-10-CM | POA: Diagnosis not present

## 2017-07-09 DIAGNOSIS — D631 Anemia in chronic kidney disease: Secondary | ICD-10-CM | POA: Diagnosis not present

## 2017-07-09 DIAGNOSIS — N186 End stage renal disease: Secondary | ICD-10-CM | POA: Diagnosis not present

## 2017-07-12 DIAGNOSIS — D631 Anemia in chronic kidney disease: Secondary | ICD-10-CM | POA: Diagnosis not present

## 2017-07-12 DIAGNOSIS — N2581 Secondary hyperparathyroidism of renal origin: Secondary | ICD-10-CM | POA: Diagnosis not present

## 2017-07-12 DIAGNOSIS — N186 End stage renal disease: Secondary | ICD-10-CM | POA: Diagnosis not present

## 2017-07-13 DIAGNOSIS — T8612 Kidney transplant failure: Secondary | ICD-10-CM | POA: Diagnosis not present

## 2017-07-13 DIAGNOSIS — N186 End stage renal disease: Secondary | ICD-10-CM | POA: Diagnosis not present

## 2017-07-13 DIAGNOSIS — Z992 Dependence on renal dialysis: Secondary | ICD-10-CM | POA: Diagnosis not present

## 2017-07-13 DIAGNOSIS — R221 Localized swelling, mass and lump, neck: Secondary | ICD-10-CM | POA: Diagnosis not present

## 2017-07-14 DIAGNOSIS — Z23 Encounter for immunization: Secondary | ICD-10-CM | POA: Diagnosis not present

## 2017-07-14 DIAGNOSIS — N186 End stage renal disease: Secondary | ICD-10-CM | POA: Diagnosis not present

## 2017-07-14 DIAGNOSIS — D631 Anemia in chronic kidney disease: Secondary | ICD-10-CM | POA: Diagnosis not present

## 2017-07-14 DIAGNOSIS — N2581 Secondary hyperparathyroidism of renal origin: Secondary | ICD-10-CM | POA: Diagnosis not present

## 2017-07-16 DIAGNOSIS — N2581 Secondary hyperparathyroidism of renal origin: Secondary | ICD-10-CM | POA: Diagnosis not present

## 2017-07-16 DIAGNOSIS — N186 End stage renal disease: Secondary | ICD-10-CM | POA: Diagnosis not present

## 2017-07-16 DIAGNOSIS — Z23 Encounter for immunization: Secondary | ICD-10-CM | POA: Diagnosis not present

## 2017-07-16 DIAGNOSIS — D631 Anemia in chronic kidney disease: Secondary | ICD-10-CM | POA: Diagnosis not present

## 2017-07-19 DIAGNOSIS — N2581 Secondary hyperparathyroidism of renal origin: Secondary | ICD-10-CM | POA: Diagnosis not present

## 2017-07-19 DIAGNOSIS — D631 Anemia in chronic kidney disease: Secondary | ICD-10-CM | POA: Diagnosis not present

## 2017-07-19 DIAGNOSIS — N186 End stage renal disease: Secondary | ICD-10-CM | POA: Diagnosis not present

## 2017-07-19 DIAGNOSIS — Z23 Encounter for immunization: Secondary | ICD-10-CM | POA: Diagnosis not present

## 2017-07-21 DIAGNOSIS — D631 Anemia in chronic kidney disease: Secondary | ICD-10-CM | POA: Diagnosis not present

## 2017-07-21 DIAGNOSIS — N2581 Secondary hyperparathyroidism of renal origin: Secondary | ICD-10-CM | POA: Diagnosis not present

## 2017-07-21 DIAGNOSIS — N186 End stage renal disease: Secondary | ICD-10-CM | POA: Diagnosis not present

## 2017-07-21 DIAGNOSIS — Z23 Encounter for immunization: Secondary | ICD-10-CM | POA: Diagnosis not present

## 2017-07-23 DIAGNOSIS — Z23 Encounter for immunization: Secondary | ICD-10-CM | POA: Diagnosis not present

## 2017-07-23 DIAGNOSIS — D631 Anemia in chronic kidney disease: Secondary | ICD-10-CM | POA: Diagnosis not present

## 2017-07-23 DIAGNOSIS — N186 End stage renal disease: Secondary | ICD-10-CM | POA: Diagnosis not present

## 2017-07-23 DIAGNOSIS — N2581 Secondary hyperparathyroidism of renal origin: Secondary | ICD-10-CM | POA: Diagnosis not present

## 2017-07-26 DIAGNOSIS — N186 End stage renal disease: Secondary | ICD-10-CM | POA: Diagnosis not present

## 2017-07-26 DIAGNOSIS — N2581 Secondary hyperparathyroidism of renal origin: Secondary | ICD-10-CM | POA: Diagnosis not present

## 2017-07-26 DIAGNOSIS — Z23 Encounter for immunization: Secondary | ICD-10-CM | POA: Diagnosis not present

## 2017-07-26 DIAGNOSIS — D631 Anemia in chronic kidney disease: Secondary | ICD-10-CM | POA: Diagnosis not present

## 2017-07-28 DIAGNOSIS — N186 End stage renal disease: Secondary | ICD-10-CM | POA: Diagnosis not present

## 2017-07-28 DIAGNOSIS — N2581 Secondary hyperparathyroidism of renal origin: Secondary | ICD-10-CM | POA: Diagnosis not present

## 2017-07-28 DIAGNOSIS — D631 Anemia in chronic kidney disease: Secondary | ICD-10-CM | POA: Diagnosis not present

## 2017-07-28 DIAGNOSIS — Z23 Encounter for immunization: Secondary | ICD-10-CM | POA: Diagnosis not present

## 2017-07-30 DIAGNOSIS — D631 Anemia in chronic kidney disease: Secondary | ICD-10-CM | POA: Diagnosis not present

## 2017-07-30 DIAGNOSIS — N186 End stage renal disease: Secondary | ICD-10-CM | POA: Diagnosis not present

## 2017-07-30 DIAGNOSIS — Z23 Encounter for immunization: Secondary | ICD-10-CM | POA: Diagnosis not present

## 2017-07-30 DIAGNOSIS — N2581 Secondary hyperparathyroidism of renal origin: Secondary | ICD-10-CM | POA: Diagnosis not present

## 2017-08-02 DIAGNOSIS — D631 Anemia in chronic kidney disease: Secondary | ICD-10-CM | POA: Diagnosis not present

## 2017-08-02 DIAGNOSIS — N2581 Secondary hyperparathyroidism of renal origin: Secondary | ICD-10-CM | POA: Diagnosis not present

## 2017-08-02 DIAGNOSIS — Z23 Encounter for immunization: Secondary | ICD-10-CM | POA: Diagnosis not present

## 2017-08-02 DIAGNOSIS — N186 End stage renal disease: Secondary | ICD-10-CM | POA: Diagnosis not present

## 2017-08-02 IMAGING — CR DG CHEST 1V PORT
2 series · 2 of 2 positions shown · non-contrast
Comparison: May 14, 2016

CLINICAL DATA: Hemodialysis catheter insertion

EXAM:
PORTABLE CHEST 1 VIEW

[AP (1 of 2)]
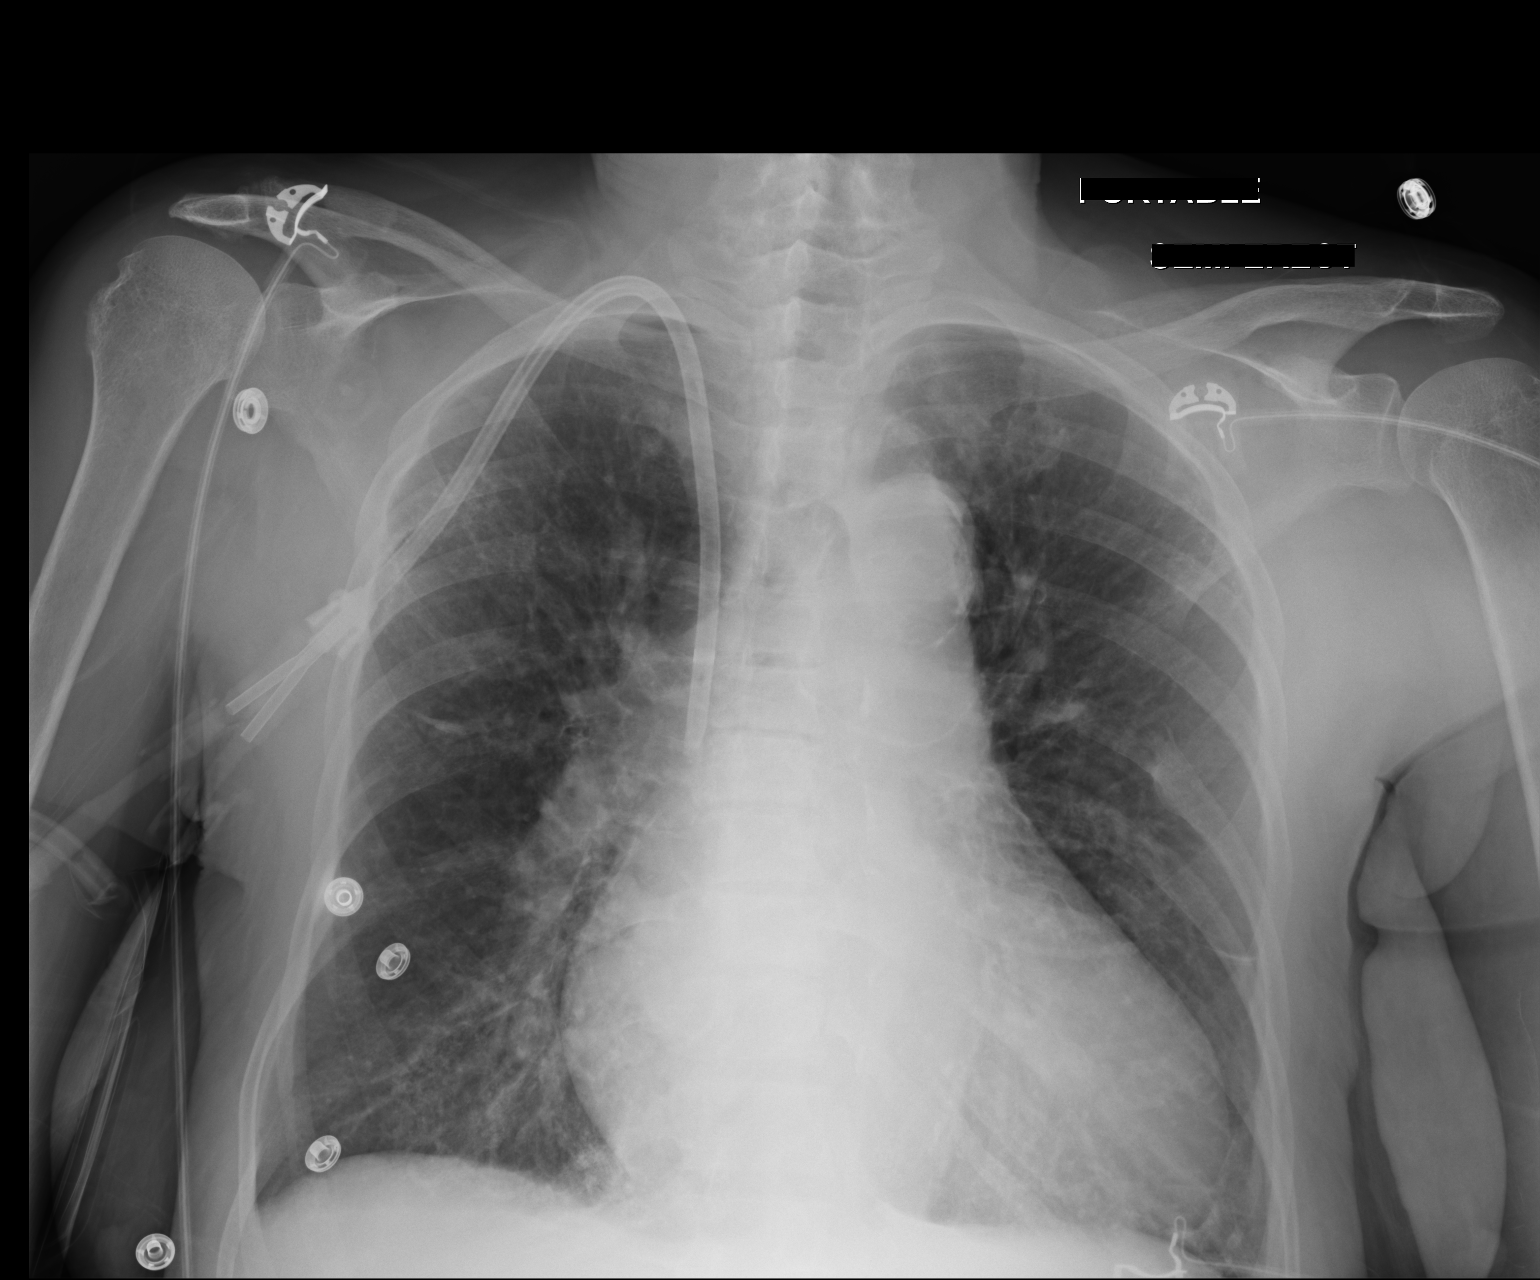

[AP (2 of 2)]
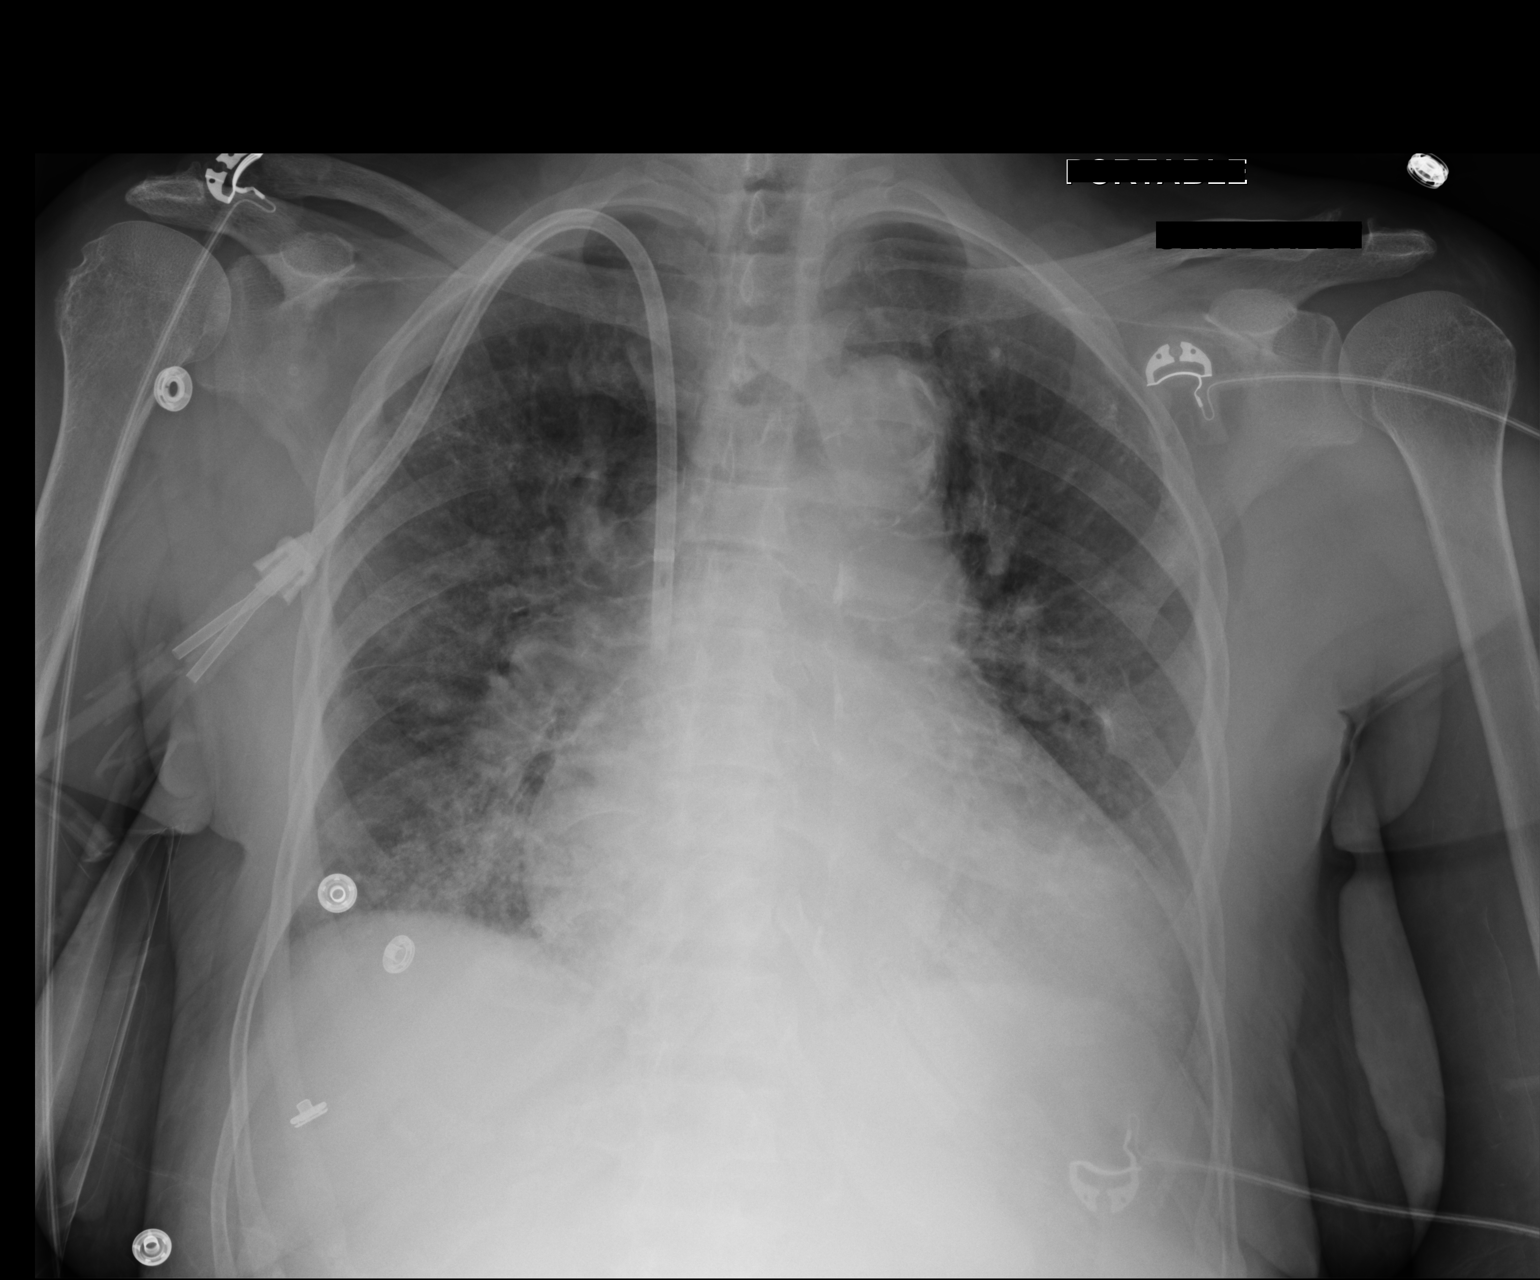

[2 of 2 positions shown; findings below may reference images not displayed]

FINDINGS: Central catheter tip is in the superior vena cava. No pneumothorax.
The lungs are mildly hyperexpanded. There is scarring in the left
mid lung and right mid lung regions. There is no edema or
consolidation. Heart is enlarged with pulmonary vascular within
normal limits. There is atherosclerotic calcification in the aorta
as well as calcification in both carotid arteries. No evident
adenopathy. There is postoperative change in the lower cervical
spine region.
IMPRESSION: Central catheter tip in superior vena cava. No pneumothorax. Stable
cardiomegaly. No edema or consolidation. Aortic atherosclerosis as
well as calcification noted in each carotid artery.

## 2017-08-04 DIAGNOSIS — N2581 Secondary hyperparathyroidism of renal origin: Secondary | ICD-10-CM | POA: Diagnosis not present

## 2017-08-04 DIAGNOSIS — Z23 Encounter for immunization: Secondary | ICD-10-CM | POA: Diagnosis not present

## 2017-08-04 DIAGNOSIS — D631 Anemia in chronic kidney disease: Secondary | ICD-10-CM | POA: Diagnosis not present

## 2017-08-04 DIAGNOSIS — N186 End stage renal disease: Secondary | ICD-10-CM | POA: Diagnosis not present

## 2017-08-06 DIAGNOSIS — Z23 Encounter for immunization: Secondary | ICD-10-CM | POA: Diagnosis not present

## 2017-08-06 DIAGNOSIS — D631 Anemia in chronic kidney disease: Secondary | ICD-10-CM | POA: Diagnosis not present

## 2017-08-06 DIAGNOSIS — N2581 Secondary hyperparathyroidism of renal origin: Secondary | ICD-10-CM | POA: Diagnosis not present

## 2017-08-06 DIAGNOSIS — N186 End stage renal disease: Secondary | ICD-10-CM | POA: Diagnosis not present

## 2017-08-09 DIAGNOSIS — Z23 Encounter for immunization: Secondary | ICD-10-CM | POA: Diagnosis not present

## 2017-08-09 DIAGNOSIS — D631 Anemia in chronic kidney disease: Secondary | ICD-10-CM | POA: Diagnosis not present

## 2017-08-09 DIAGNOSIS — N2581 Secondary hyperparathyroidism of renal origin: Secondary | ICD-10-CM | POA: Diagnosis not present

## 2017-08-09 DIAGNOSIS — N186 End stage renal disease: Secondary | ICD-10-CM | POA: Diagnosis not present

## 2017-08-11 DIAGNOSIS — N186 End stage renal disease: Secondary | ICD-10-CM | POA: Diagnosis not present

## 2017-08-11 DIAGNOSIS — D631 Anemia in chronic kidney disease: Secondary | ICD-10-CM | POA: Diagnosis not present

## 2017-08-11 DIAGNOSIS — Z23 Encounter for immunization: Secondary | ICD-10-CM | POA: Diagnosis not present

## 2017-08-11 DIAGNOSIS — N2581 Secondary hyperparathyroidism of renal origin: Secondary | ICD-10-CM | POA: Diagnosis not present

## 2017-08-13 DIAGNOSIS — N186 End stage renal disease: Secondary | ICD-10-CM | POA: Diagnosis not present

## 2017-08-13 DIAGNOSIS — Z992 Dependence on renal dialysis: Secondary | ICD-10-CM | POA: Diagnosis not present

## 2017-08-13 DIAGNOSIS — N2581 Secondary hyperparathyroidism of renal origin: Secondary | ICD-10-CM | POA: Diagnosis not present

## 2017-08-13 DIAGNOSIS — D631 Anemia in chronic kidney disease: Secondary | ICD-10-CM | POA: Diagnosis not present

## 2017-08-13 DIAGNOSIS — Z23 Encounter for immunization: Secondary | ICD-10-CM | POA: Diagnosis not present

## 2017-08-13 DIAGNOSIS — T8612 Kidney transplant failure: Secondary | ICD-10-CM | POA: Diagnosis not present

## 2017-08-16 DIAGNOSIS — D631 Anemia in chronic kidney disease: Secondary | ICD-10-CM | POA: Diagnosis not present

## 2017-08-16 DIAGNOSIS — N2581 Secondary hyperparathyroidism of renal origin: Secondary | ICD-10-CM | POA: Diagnosis not present

## 2017-08-16 DIAGNOSIS — N186 End stage renal disease: Secondary | ICD-10-CM | POA: Diagnosis not present

## 2017-08-18 DIAGNOSIS — D631 Anemia in chronic kidney disease: Secondary | ICD-10-CM | POA: Diagnosis not present

## 2017-08-18 DIAGNOSIS — N2581 Secondary hyperparathyroidism of renal origin: Secondary | ICD-10-CM | POA: Diagnosis not present

## 2017-08-18 DIAGNOSIS — N186 End stage renal disease: Secondary | ICD-10-CM | POA: Diagnosis not present

## 2017-08-20 DIAGNOSIS — D631 Anemia in chronic kidney disease: Secondary | ICD-10-CM | POA: Diagnosis not present

## 2017-08-20 DIAGNOSIS — N186 End stage renal disease: Secondary | ICD-10-CM | POA: Diagnosis not present

## 2017-08-20 DIAGNOSIS — N2581 Secondary hyperparathyroidism of renal origin: Secondary | ICD-10-CM | POA: Diagnosis not present

## 2017-08-23 DIAGNOSIS — D631 Anemia in chronic kidney disease: Secondary | ICD-10-CM | POA: Diagnosis not present

## 2017-08-23 DIAGNOSIS — N2581 Secondary hyperparathyroidism of renal origin: Secondary | ICD-10-CM | POA: Diagnosis not present

## 2017-08-23 DIAGNOSIS — N186 End stage renal disease: Secondary | ICD-10-CM | POA: Diagnosis not present

## 2017-08-24 DIAGNOSIS — N186 End stage renal disease: Secondary | ICD-10-CM | POA: Diagnosis not present

## 2017-08-24 DIAGNOSIS — Z972 Presence of dental prosthetic device (complete) (partial): Secondary | ICD-10-CM | POA: Diagnosis not present

## 2017-08-24 DIAGNOSIS — R490 Dysphonia: Secondary | ICD-10-CM | POA: Diagnosis not present

## 2017-08-24 DIAGNOSIS — Z992 Dependence on renal dialysis: Secondary | ICD-10-CM | POA: Diagnosis not present

## 2017-08-24 DIAGNOSIS — K219 Gastro-esophageal reflux disease without esophagitis: Secondary | ICD-10-CM | POA: Diagnosis not present

## 2017-08-24 DIAGNOSIS — R221 Localized swelling, mass and lump, neck: Secondary | ICD-10-CM | POA: Diagnosis not present

## 2017-08-24 DIAGNOSIS — Z87891 Personal history of nicotine dependence: Secondary | ICD-10-CM | POA: Diagnosis not present

## 2017-08-25 DIAGNOSIS — D631 Anemia in chronic kidney disease: Secondary | ICD-10-CM | POA: Diagnosis not present

## 2017-08-25 DIAGNOSIS — N2581 Secondary hyperparathyroidism of renal origin: Secondary | ICD-10-CM | POA: Diagnosis not present

## 2017-08-25 DIAGNOSIS — N186 End stage renal disease: Secondary | ICD-10-CM | POA: Diagnosis not present

## 2017-08-27 DIAGNOSIS — N186 End stage renal disease: Secondary | ICD-10-CM | POA: Diagnosis not present

## 2017-08-27 DIAGNOSIS — N2581 Secondary hyperparathyroidism of renal origin: Secondary | ICD-10-CM | POA: Diagnosis not present

## 2017-08-27 DIAGNOSIS — D631 Anemia in chronic kidney disease: Secondary | ICD-10-CM | POA: Diagnosis not present

## 2017-08-30 DIAGNOSIS — N186 End stage renal disease: Secondary | ICD-10-CM | POA: Diagnosis not present

## 2017-08-30 DIAGNOSIS — D631 Anemia in chronic kidney disease: Secondary | ICD-10-CM | POA: Diagnosis not present

## 2017-08-30 DIAGNOSIS — N2581 Secondary hyperparathyroidism of renal origin: Secondary | ICD-10-CM | POA: Diagnosis not present

## 2017-09-01 ENCOUNTER — Emergency Department (HOSPITAL_COMMUNITY)
Admission: EM | Admit: 2017-09-01 | Discharge: 2017-09-01 | Disposition: A | Discharge status: Home / Self Care | Payer: Medicare Other | Attending: Physician Assistant | Admitting: Physician Assistant

## 2017-09-01 ENCOUNTER — Encounter (HOSPITAL_COMMUNITY): Payer: Self-pay | Admitting: Emergency Medicine

## 2017-09-01 DIAGNOSIS — I509 Heart failure, unspecified: Secondary | ICD-10-CM | POA: Insufficient documentation

## 2017-09-01 DIAGNOSIS — D631 Anemia in chronic kidney disease: Secondary | ICD-10-CM | POA: Diagnosis not present

## 2017-09-01 DIAGNOSIS — R404 Transient alteration of awareness: Secondary | ICD-10-CM | POA: Diagnosis not present

## 2017-09-01 DIAGNOSIS — Z79899 Other long term (current) drug therapy: Secondary | ICD-10-CM | POA: Insufficient documentation

## 2017-09-01 DIAGNOSIS — Z992 Dependence on renal dialysis: Secondary | ICD-10-CM | POA: Insufficient documentation

## 2017-09-01 DIAGNOSIS — N186 End stage renal disease: Secondary | ICD-10-CM | POA: Diagnosis not present

## 2017-09-01 DIAGNOSIS — I132 Hypertensive heart and chronic kidney disease with heart failure and with stage 5 chronic kidney disease, or end stage renal disease: Secondary | ICD-10-CM | POA: Insufficient documentation

## 2017-09-01 DIAGNOSIS — Z7902 Long term (current) use of antithrombotics/antiplatelets: Secondary | ICD-10-CM | POA: Insufficient documentation

## 2017-09-01 DIAGNOSIS — Z87891 Personal history of nicotine dependence: Secondary | ICD-10-CM | POA: Diagnosis not present

## 2017-09-01 DIAGNOSIS — N2581 Secondary hyperparathyroidism of renal origin: Secondary | ICD-10-CM | POA: Diagnosis not present

## 2017-09-01 DIAGNOSIS — Z8673 Personal history of transient ischemic attack (TIA), and cerebral infarction without residual deficits: Secondary | ICD-10-CM | POA: Insufficient documentation

## 2017-09-01 DIAGNOSIS — I953 Hypotension of hemodialysis: Secondary | ICD-10-CM | POA: Diagnosis not present

## 2017-09-01 DIAGNOSIS — R531 Weakness: Secondary | ICD-10-CM | POA: Diagnosis not present

## 2017-09-01 NOTE — ED Triage Notes (Signed)
Per [facility]: Pt to ED from dialysis c/o orthostatic hypotension. Patient reports having a cold and drinking a lot of hot tea and juice this past week and states the dialysis center may have taken too much off because the same happened about 6 months ago. Pt was encouraged to come here because she drives herself and was not comfortable driving. Pt states she did not feel bad until after having dialysis. 113/80 sitting and 84/63 standing. Pt denies dizziness/N/V, just cold-like symptoms (congestion, dry cough, runny nose). A&O x 4.

## 2017-09-01 NOTE — ED Notes (Signed)
Patient ambulated in hall and back to room with steady gait. Denies dizziness. EDP aware.

## 2017-09-01 NOTE — ED Notes (Signed)
Patient verbalized understanding of discharge instructions and denies any further needs or questions at this time. VS stable. Patient ambulatory with steady gait. Escorted to ED entrance in wheelchair.   

## 2017-09-01 NOTE — ED Provider Notes (Signed)
[facility] DEPT Provider Note   CSN: [id number] Arrival date & time: 09/01/17  1823     History   Chief Complaint Chief Complaint  Patient presents with  . Hypotension    HPI [name] is a 66 y.o. female.  HPI   Patient presenting with ESRD, hypertension, CHF, anemia, A. Fib, hx of CVA presenting, and dialysis. Per patient. EMS was called at dialysis as blood pressure was low.Patient states that she has had a cold and has been drinking a lot of hot tea and juice this past week. Dialysis felt she had a lot of fluid on her and sshe states that they took a significant amount of fluid off today. Patient was encouraged to come here because she drives herself and felt uncomfortable driving home. Patient has any dizziness nausea vomiting, chest pain, shortness of breath, focal weakness or numbness. Patient never lost consciousness. Patient has had a history of orthostatic hypotension in the past.  Past Medical History:  Diagnosis Date  . Adenomatous polyp of colon 10/2010, 2006, 2015  . Anemia in CKD (chronic kidney disease) 11/07/2012   s/p blood transfusion.   . Arthritis   . CHF (congestive heart failure) ([place])   . Constipation   . Depression with anxiety   . Diverticula, colon   . 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  Had deceased donor kidney transplant in 1996.  Had some early rejection then stable function for years, then had slow decline of function and went back on hemodialysis in 2012.  Gets HD TTS schedule at [facility] on [facility] still using L forearm AVF.     Marland Kitchen GERD (gastroesophageal reflux disease)   . Headache   . Hyperlipidemia   . Hypertension   . Neuromuscular disorder (HCC)    neuropathy hand and legs  . Osteoporosis   . Pneumonia   . 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 Stroke [facility]) 11/2015   TIA  . Weight loss, unintentional      Patient Active Problem List   Diagnosis Date Noted  . Atrial fibrillation ([place]) 03/23/2017  . Prolonged Q-T interval on ECG 02/24/2017  . Hypokalemia 02/24/2017  . Acute ischemic stroke ([place]) 02/23/2017  . Malnutrition of moderate degree 12/24/2016  . Problem with dialysis access ([place]) 12/21/2016  . Acute ischemic colitis ([place]) 05/16/2016  . Colitis 05/15/2016  . Rectal bleeding 05/15/2016  . Neurologic abnormality 11/19/2015  . Altered mental state 11/19/2015  . Hyperkalemia 11/19/2015  . Anxiety 11/19/2015  . Insomnia 11/19/2015  . ESRD (end stage renal disease) on dialysis ([place])   . Gait instability   . Stroke-like symptom 11/16/2015  . Vestibular neuritis 11/16/2015  . Stroke (cerebrum) ([place]) 11/16/2015  . Dizziness 05/09/2015  . Ataxia 05/09/2015  . H/O: CVA (cerebrovascular accident) 05/09/2015  . Left facial numbness 05/09/2015  . Left leg numbness 05/09/2015  . Hyperlipidemia   . CHF (congestive heart failure) ([place])   . Congestive heart disease ([place])   . Shortness of breath 04/01/2013  . Volume overload 04/01/2013  . HTN (hypertension) 04/01/2013  . Cholecystitis, acute 11/07/2012  . ESRD (end stage renal disease) ([place]) 11/07/2012  . GERD (gastroesophageal reflux disease) 11/07/2012  . Anemia in CKD (chronic kidney disease) 11/07/2012    Past Surgical History:  Procedure Laterality Date  . AV FISTULA PLACEMENT     for dialysis  .  AV FISTULA PLACEMENT Left 11/22/2015   Procedure: ARTERIOVENOUS (AV) FISTULA CREATION-LEFT BRACHIOCEPHALIC;  Surgeon: Serafina Mitchell, MD;  Location: [facility];  Service: Vascular;  Laterality: Left;  . BACK SURGERY    . CERVICAL FUSION    . CHOLECYSTECTOMY  12/02/2012   Procedure: LAPAROSCOPIC CHOLECYSTECTOMY WITH INTRAOPERATIVE CHOLANGIOGRAM;  Surgeon: Edward Jolly, MD;  Location: [facility];  Service: General;  Laterality: N/A;  . EYE SURGERY Bilateral    cataract surgery  . HEMATOMA EVACUATION Left 12/24/2016   Procedure:  EVACUATION HEMATOMA LEFT UPPER ARM;  Surgeon: Waynetta Sandy, MD;  Location: [facility];  Service: Vascular;  Laterality: Left;  . I&D EXTREMITY Left 12/31/2016   Procedure: IRRIGATION AND DEBRIDEMENT EXTREMITY;  Surgeon: Angelia Mould, MD;  Location: [facility];  Service: Vascular;  Laterality: Left;  . INSERTION OF DIALYSIS CATHETER Right 12/24/2016   Procedure: INSERTION OF DIALYSIS CATHETER;  Surgeon: Waynetta Sandy, MD;  Location: [facility];  Service: Vascular;  Laterality: Right;  . INSERTION OF DIALYSIS CATHETER Right 02/04/2017   Procedure: INSERTION OF DIALYSIS CATHETER;  Surgeon: Waynetta Sandy, MD;  Location: [facility];  Service: Vascular;  Laterality: Right;  . KIDNEY TRANSPLANT  1996  . PERIPHERAL VASCULAR CATHETERIZATION Left 10/23/2016   Procedure: Fistulagram;  Surgeon: Elam Dutch, MD;  Location: [facility];  Service: Cardiovascular;  Laterality: Left;  . RESECTION OF ARTERIOVENOUS FISTULA ANEURYSM Left 11/22/2015   Procedure: RESECTION OF LEFT RADIOCEPHALIC FISTULA ANEURYSM ;  Surgeon: Serafina Mitchell, MD;  Location: [facility];  Service: Vascular;  Laterality: Left;  . REVISON OF ARTERIOVENOUS FISTULA Left 12/22/2016   Procedure: REVISON OF LEFT ARTERIOVENOUS FISTULA;  Surgeon: Waynetta Sandy, MD;  Location: [place];  Service: Vascular;  Laterality: Left;  . REVISON OF ARTERIOVENOUS FISTULA Left 02/04/2017   Procedure: REVISON OF LEFT UPPER ARM ARTERIOVENOUS FISTULA;  Surgeon: Waynetta Sandy, MD;  Location: [facility];  Service: Vascular;  Laterality: Left;    OB History    No data available       Home Medications    Prior to Admission medications   Medication Sig Start Date End Date Taking? Authorizing Provider  acetaminophen (TYLENOL) 325 MG tablet Take 650 mg by mouth every 6 (six) hours as needed.    [provider]  allopurinol (ZYLOPRIM) 300 MG tablet Take 150 mg by mouth at bedtime.    [provider]    ALPRAZolam Duanne Moron) 0.25 MG tablet Take 0.25 mg by mouth 2 (two) times daily as needed for anxiety.     [provider]  AURYXIA 1 GM 210 MG(Fe) tablet Take 420 mg by mouth 3 (three) times daily with meals.  12/22/16   [provider]  B Complex-C-Folic Acid (DIALYVITE PO) Take 1 tablet by mouth daily.    [provider]  clopidogrel (PLAVIX) 75 MG tablet Take 1 tablet (75 mg total) by mouth daily. 02/26/17   Velvet Bathe, MD  doxazosin (CARDURA) 8 MG tablet Take 1 tablet (8 mg total) by mouth at bedtime. 12/26/16   Alvia Grove, PA-C  labetalol (NORMODYNE) 200 MG tablet Take 200 mg by mouth 2 (two) times daily.     [provider]  lactulose (CHRONULAC) 10 GM/15ML solution Take 20 g by mouth daily as needed for mild constipation.    [provider]  lanthanum (FOSRENOL) 1000 MG chewable tablet Chew 1,000 mg by mouth 2 (two) times daily with a meal.    [provider]  losartan (COZAAR) 100 MG tablet Take 100 mg by mouth at bedtime.     [provider]  meclizine (ANTIVERT) 25 MG tablet Take 1 tablet (25 mg total) by mouth 2 (two) times daily as needed for dizziness. 06/24/16   Merryl Hacker, MD  SENSIPAR 30 MG tablet Take 30 mg by mouth every Monday, Wednesday, and Friday with hemodialysis.  08/20/16   [provider]  simvastatin (ZOCOR) 20 MG tablet Take 1 tablet (20 mg total) by mouth at bedtime. 02/26/17   Velvet Bathe, MD  zolpidem (AMBIEN) 10 MG tablet 1 tablet    [provider]    Family History Family History  Problem Relation Age of Onset  . Colon cancer Brother   . Cancer Brother   . Coronary artery disease Mother 50  . Hyperlipidemia Mother   . Hypertension Mother   . Stroke Maternal Aunt   . Esophageal cancer Neg Hx   . Stomach cancer Neg Hx   . Rectal cancer Neg Hx     Social History Social History  Substance Use Topics  . Smoking status: Former Smoker    Types: Cigarettes    Quit  date: 12/31/1991  . Smokeless tobacco: Never Used  . Alcohol use No     Allergies   Sulfa antibiotics and Adhesive [tape]   Review of Systems Review of Systems  Constitutional: Negative for chills and fever.  HENT: Negative for congestion and rhinorrhea.   Respiratory: Negative for cough and shortness of breath.   Cardiovascular: Negative for chest pain and palpitations.  Gastrointestinal: Negative for nausea and vomiting.  Neurological: Negative for dizziness, weakness, numbness and headaches.     Physical Exam Updated Vital Signs BP 114/69   Pulse 77   Temp 98.2 F (36.8 C) (Oral)   Resp 18   SpO2 100%   Physical Exam  Constitutional: She is oriented to person, place, and time. She appears well-developed and well-nourished.  HENT:  Head: Normocephalic and atraumatic.  Eyes: Pupils are equal, round, and reactive to light. Conjunctivae are normal.  Neck: Normal range of motion. Neck supple.  Cardiovascular: Normal rate.   Murmur heard. Pulmonary/Chest: Effort normal and breath sounds normal.  Abdominal: Soft. Bowel sounds are normal.  Musculoskeletal: Normal range of motion.  Neurological: She is alert and oriented to person, place, and time.  Skin: Skin is warm. Capillary refill takes less than 2 seconds.  Psychiatric: She has a normal mood and affect.     ED Treatments / Results  Labs (all labs ordered are listed, but only abnormal results are displayed) Labs Reviewed - No data to display  EKG  EKG Interpretation None       Radiology No results found.  Procedures Procedures (including critical care time)  Medications Ordered in ED Medications - No data to display   Initial Impression / Assessment and Plan / ED Course  I have reviewed the triage vital signs and the nursing notes.  Pertinent labs & imaging results that were available during my care of the patient were reviewed by me and considered in my medical decision making (see chart for  details).   Patient presenting from dialysis with orthostatic hypotension. Patient states that she has had orthostatic hypotension previously with dialysis. The ED her vitals have been within normal limits. Patient denies any other symptoms, no shortness of breath, no chest pain or dizziness no focal weakness. Patient was able to eat and ambulate and was feeling well  and ready to go home  Final Clinical Impressions(s) / ED Diagnoses   Final diagnoses:  Hemodialysis-associated hypotension    New Prescriptions New Prescriptions   No medications on file     Tonette Bihari, MD 09/01/17 2027    Macarthur Critchley, MD 09/05/17 [phone]

## 2017-09-01 NOTE — Discharge Instructions (Signed)
Please be careful with getting up from sitting to standing position, given your history of orthostatic hypotension. Please follow up as needed with your PCP.

## 2017-09-01 NOTE — ED Notes (Signed)
Provided patient with orange juice, tolerating well.

## 2017-09-03 DIAGNOSIS — N2581 Secondary hyperparathyroidism of renal origin: Secondary | ICD-10-CM | POA: Diagnosis not present

## 2017-09-03 DIAGNOSIS — N186 End stage renal disease: Secondary | ICD-10-CM | POA: Diagnosis not present

## 2017-09-03 DIAGNOSIS — D631 Anemia in chronic kidney disease: Secondary | ICD-10-CM | POA: Diagnosis not present

## 2017-09-06 DIAGNOSIS — D631 Anemia in chronic kidney disease: Secondary | ICD-10-CM | POA: Diagnosis not present

## 2017-09-06 DIAGNOSIS — N2581 Secondary hyperparathyroidism of renal origin: Secondary | ICD-10-CM | POA: Diagnosis not present

## 2017-09-06 DIAGNOSIS — N186 End stage renal disease: Secondary | ICD-10-CM | POA: Diagnosis not present

## 2017-09-08 DIAGNOSIS — D631 Anemia in chronic kidney disease: Secondary | ICD-10-CM | POA: Diagnosis not present

## 2017-09-08 DIAGNOSIS — N2581 Secondary hyperparathyroidism of renal origin: Secondary | ICD-10-CM | POA: Diagnosis not present

## 2017-09-08 DIAGNOSIS — N186 End stage renal disease: Secondary | ICD-10-CM | POA: Diagnosis not present

## 2017-09-10 DIAGNOSIS — N186 End stage renal disease: Secondary | ICD-10-CM | POA: Diagnosis not present

## 2017-09-10 DIAGNOSIS — N2581 Secondary hyperparathyroidism of renal origin: Secondary | ICD-10-CM | POA: Diagnosis not present

## 2017-09-10 DIAGNOSIS — D631 Anemia in chronic kidney disease: Secondary | ICD-10-CM | POA: Diagnosis not present

## 2017-09-12 DIAGNOSIS — T8612 Kidney transplant failure: Secondary | ICD-10-CM | POA: Diagnosis not present

## 2017-09-12 DIAGNOSIS — Z992 Dependence on renal dialysis: Secondary | ICD-10-CM | POA: Diagnosis not present

## 2017-09-12 DIAGNOSIS — N186 End stage renal disease: Secondary | ICD-10-CM | POA: Diagnosis not present

## 2017-09-13 DIAGNOSIS — N186 End stage renal disease: Secondary | ICD-10-CM | POA: Diagnosis not present

## 2017-09-13 DIAGNOSIS — D631 Anemia in chronic kidney disease: Secondary | ICD-10-CM | POA: Diagnosis not present

## 2017-09-13 DIAGNOSIS — N2581 Secondary hyperparathyroidism of renal origin: Secondary | ICD-10-CM | POA: Diagnosis not present

## 2017-09-13 IMAGING — CR DG CHEST 1V PORT
1 series · 1 of 1 positions shown · non-contrast
Comparison: 12/24/2016

CLINICAL DATA: Status post dialysis catheter insertion.

EXAM:
PORTABLE CHEST 1 VIEW

[AP]
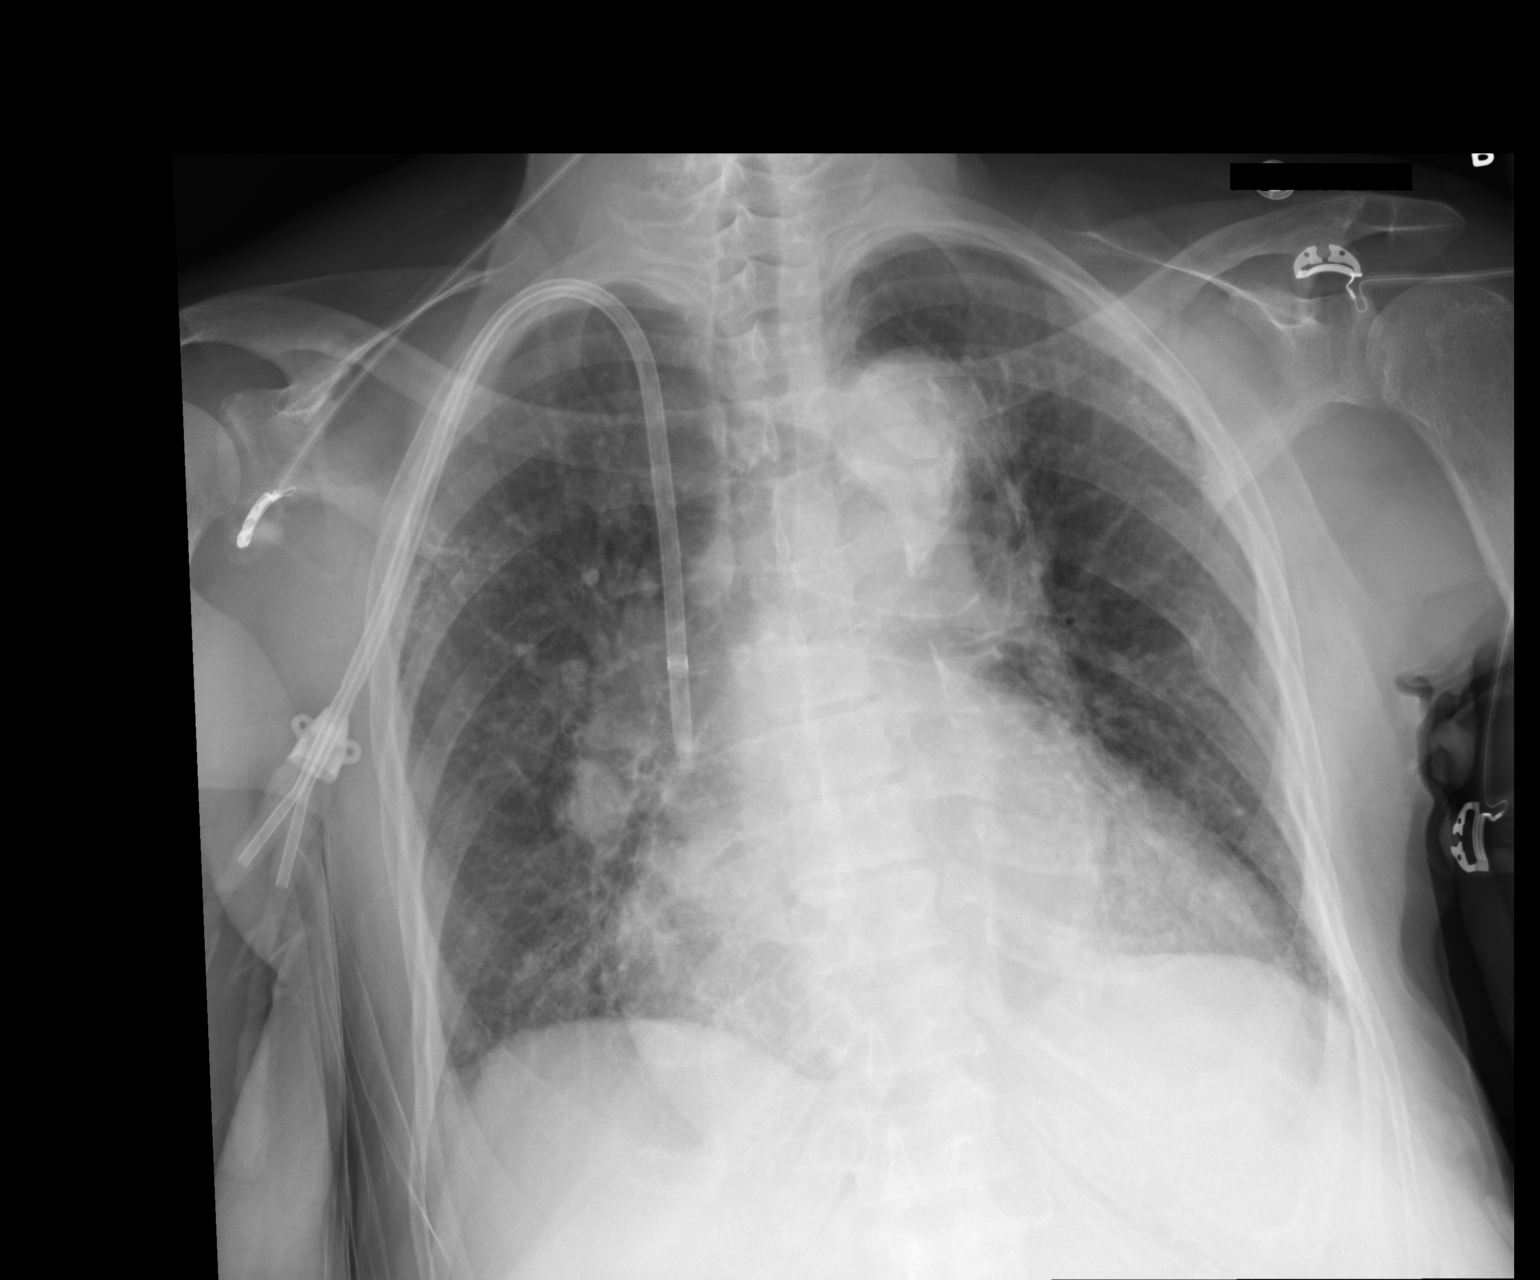

[1 of 1 positions shown; findings below may reference images not displayed]

FINDINGS: Right internal jugular approach dual lumen central venous catheter
terminates in the expected location of distal superior vena cava.

The cardiac silhouette is enlarged. Mediastinal contours appear
intact. Calcific atherosclerotic disease and tortuosity of the
aorta.

There is no evidence of focal airspace consolidation, pleural
effusion or pneumothorax. Mild pulmonary vascular congestion without
evidence of frank edema.

Osseous structures are without acute abnormality. Soft tissues are
grossly normal.
IMPRESSION: Mild pulmonary vascular congestion without evidence of frank
pulmonary edema.

Enlarged cardiac silhouette.

Dual lumen central venous catheter in satisfactory radiographic
position.

## 2017-09-15 DIAGNOSIS — N2581 Secondary hyperparathyroidism of renal origin: Secondary | ICD-10-CM | POA: Diagnosis not present

## 2017-09-15 DIAGNOSIS — D631 Anemia in chronic kidney disease: Secondary | ICD-10-CM | POA: Diagnosis not present

## 2017-09-15 DIAGNOSIS — N186 End stage renal disease: Secondary | ICD-10-CM | POA: Diagnosis not present

## 2017-09-17 DIAGNOSIS — D631 Anemia in chronic kidney disease: Secondary | ICD-10-CM | POA: Diagnosis not present

## 2017-09-17 DIAGNOSIS — N2581 Secondary hyperparathyroidism of renal origin: Secondary | ICD-10-CM | POA: Diagnosis not present

## 2017-09-17 DIAGNOSIS — N186 End stage renal disease: Secondary | ICD-10-CM | POA: Diagnosis not present

## 2017-09-20 DIAGNOSIS — N186 End stage renal disease: Secondary | ICD-10-CM | POA: Diagnosis not present

## 2017-09-20 DIAGNOSIS — N2581 Secondary hyperparathyroidism of renal origin: Secondary | ICD-10-CM | POA: Diagnosis not present

## 2017-09-20 DIAGNOSIS — D631 Anemia in chronic kidney disease: Secondary | ICD-10-CM | POA: Diagnosis not present

## 2017-09-22 DIAGNOSIS — N2581 Secondary hyperparathyroidism of renal origin: Secondary | ICD-10-CM | POA: Diagnosis not present

## 2017-09-22 DIAGNOSIS — D631 Anemia in chronic kidney disease: Secondary | ICD-10-CM | POA: Diagnosis not present

## 2017-09-22 DIAGNOSIS — N186 End stage renal disease: Secondary | ICD-10-CM | POA: Diagnosis not present

## 2017-09-24 DIAGNOSIS — N186 End stage renal disease: Secondary | ICD-10-CM | POA: Diagnosis not present

## 2017-09-24 DIAGNOSIS — N2581 Secondary hyperparathyroidism of renal origin: Secondary | ICD-10-CM | POA: Diagnosis not present

## 2017-09-24 DIAGNOSIS — D631 Anemia in chronic kidney disease: Secondary | ICD-10-CM | POA: Diagnosis not present

## 2017-09-27 DIAGNOSIS — N2581 Secondary hyperparathyroidism of renal origin: Secondary | ICD-10-CM | POA: Diagnosis not present

## 2017-09-27 DIAGNOSIS — N186 End stage renal disease: Secondary | ICD-10-CM | POA: Diagnosis not present

## 2017-09-27 DIAGNOSIS — D631 Anemia in chronic kidney disease: Secondary | ICD-10-CM | POA: Diagnosis not present

## 2017-09-29 DIAGNOSIS — D631 Anemia in chronic kidney disease: Secondary | ICD-10-CM | POA: Diagnosis not present

## 2017-09-29 DIAGNOSIS — N186 End stage renal disease: Secondary | ICD-10-CM | POA: Diagnosis not present

## 2017-09-29 DIAGNOSIS — N2581 Secondary hyperparathyroidism of renal origin: Secondary | ICD-10-CM | POA: Diagnosis not present

## 2017-09-30 ENCOUNTER — Emergency Department (HOSPITAL_COMMUNITY)
Admission: EM | Admit: 2017-09-30 | Discharge: 2017-09-30 | Disposition: A | Discharge status: Home / Self Care | Payer: Medicare Other | Attending: Emergency Medicine | Admitting: Emergency Medicine

## 2017-09-30 ENCOUNTER — Emergency Department (HOSPITAL_COMMUNITY): Payer: Medicare Other

## 2017-09-30 ENCOUNTER — Encounter (HOSPITAL_COMMUNITY): Payer: Self-pay | Admitting: Emergency Medicine

## 2017-09-30 DIAGNOSIS — E785 Hyperlipidemia, unspecified: Secondary | ICD-10-CM | POA: Insufficient documentation

## 2017-09-30 DIAGNOSIS — R9389 Abnormal findings on diagnostic imaging of other specified body structures: Secondary | ICD-10-CM | POA: Diagnosis not present

## 2017-09-30 DIAGNOSIS — R0789 Other chest pain: Secondary | ICD-10-CM

## 2017-09-30 DIAGNOSIS — M549 Dorsalgia, unspecified: Secondary | ICD-10-CM | POA: Diagnosis present

## 2017-09-30 DIAGNOSIS — Z79899 Other long term (current) drug therapy: Secondary | ICD-10-CM | POA: Diagnosis not present

## 2017-09-30 DIAGNOSIS — Z87891 Personal history of nicotine dependence: Secondary | ICD-10-CM | POA: Insufficient documentation

## 2017-09-30 DIAGNOSIS — N186 End stage renal disease: Secondary | ICD-10-CM | POA: Insufficient documentation

## 2017-09-30 DIAGNOSIS — R079 Chest pain, unspecified: Secondary | ICD-10-CM | POA: Diagnosis not present

## 2017-09-30 DIAGNOSIS — Z8673 Personal history of transient ischemic attack (TIA), and cerebral infarction without residual deficits: Secondary | ICD-10-CM | POA: Diagnosis not present

## 2017-09-30 DIAGNOSIS — I132 Hypertensive heart and chronic kidney disease with heart failure and with stage 5 chronic kidney disease, or end stage renal disease: Secondary | ICD-10-CM | POA: Insufficient documentation

## 2017-09-30 DIAGNOSIS — I509 Heart failure, unspecified: Secondary | ICD-10-CM | POA: Insufficient documentation

## 2017-09-30 DIAGNOSIS — Z940 Kidney transplant status: Secondary | ICD-10-CM | POA: Diagnosis not present

## 2017-09-30 DIAGNOSIS — J984 Other disorders of lung: Secondary | ICD-10-CM | POA: Diagnosis not present

## 2017-09-30 LAB — BASIC METABOLIC PANEL
ANION GAP: 15 (ref 5–15)
BUN: 26 mg/dL — ABNORMAL HIGH (ref 6–20)
CALCIUM: 8.8 mg/dL — AB (ref 8.9–10.3)
CO2: 32 mmol/L (ref 22–32)
Chloride: 90 mmol/L — ABNORMAL LOW (ref 101–111)
Creatinine, Ser: 5.83 mg/dL — ABNORMAL HIGH (ref 0.44–1.00)
GFR, EST AFRICAN AMERICAN: 8 mL/min — AB (ref >60)
GFR, EST NON AFRICAN AMERICAN: 7 mL/min — AB (ref >60)
Glucose, Bld: 96 mg/dL (ref 65–99)
Potassium: 4.4 mmol/L (ref 3.5–5.1)
Sodium: 137 mmol/L (ref 135–145)

## 2017-09-30 LAB — I-STAT TROPONIN, ED
TROPONIN I, POC: 0 ng/mL (ref 0.00–0.08)
Troponin i, poc: 0 ng/mL (ref 0.00–0.08)

## 2017-09-30 LAB — CBC
HCT: 35.7 % — ABNORMAL LOW (ref 36.0–46.0)
HEMOGLOBIN: 11.4 g/dL — AB (ref 12.0–15.0)
MCH: 30.6 pg (ref 26.0–34.0)
MCHC: 31.9 g/dL (ref 30.0–36.0)
MCV: 95.7 fL (ref 78.0–100.0)
Platelets: 212 10*3/uL (ref 150–400)
RBC: 3.73 MIL/uL — AB (ref 3.87–5.11)
RDW: 16.7 % — ABNORMAL HIGH (ref 11.5–15.5)
WBC: 7.5 10*3/uL (ref 4.0–10.5)

## 2017-09-30 MED ORDER — IOPAMIDOL (ISOVUE-370) INJECTION 76%
INTRAVENOUS | Status: AC
  Administered 2017-09-30: 100 mL
  Filled 2017-09-30: qty 100

## 2017-09-30 MED ORDER — ASPIRIN 81 MG PO CHEW
324.0000 mg | CHEWABLE_TABLET | Freq: Once | ORAL | Status: AC
  Administered 2017-09-30: 324 mg via ORAL
  Filled 2017-09-30: qty 4

## 2017-09-30 MED ORDER — HYDROCODONE-ACETAMINOPHEN 5-325 MG PO TABS: ORAL_TABLET | ORAL | 0 refills | Status: DC

## 2017-09-30 MED ORDER — DICLOFENAC SODIUM 1 % TD GEL: 2.0000 g | Freq: Four times a day (QID) | TRANSDERMAL | 0 refills | Status: DC

## 2017-09-30 NOTE — Discharge Instructions (Signed)
Your CAT scan today shows abnormalities in your lungs. It is very important that you follow up with the pulmonologist (lung doctor ) for further evaluation.  Take vicodin for breakthrough pain, do not drink alcohol, drive, care for children or do other critical tasks while taking vicodin.  Please follow with your primary care doctor in the next 2 days for a check-up. They must obtain records for further management.   Do not hesitate to return to the Emergency Department for any new, worsening or concerning symptoms.    Please be very careful not to fall! The pain medication puts you at risk for falls. Please rest as much as possible and try to not stay alone.

## 2017-09-30 NOTE — ED Provider Notes (Signed)
[facility] EMERGENCY DEPARTMENT Provider Note   CSN: [id number] Arrival date & time: 09/30/17  1440     History   Chief Complaint Chief Complaint  Patient presents with  . Back Pain   HPI   Blood pressure (!) 157/88, pulse 79, temperature 98.2 F (36.8 C), temperature source Oral, resp. rate 12, SpO2 96 %.  [name] is a 66 y.o. female with past medical history significant for ESRD (fully dialyzed yesterday), CHF, multiple strokes, not anticoagulated complaining of pleuritic left-sided chest pain onset 2 days ago with associated productive cough, tactile fever with no chills, palpitations, syncope, diaphoresis. She states it feels similar to prior episode of pleurisy. She denies any calf pain, leg swelling, history of DVT or PE, recent immobilizations. No pain medication taken prior to arrival. No prior history of ACS.  Past Medical History:  Diagnosis Date  . Adenomatous polyp of colon 10/2010, 2006, 2015  . Anemia in CKD (chronic kidney disease) 11/07/2012   s/p blood transfusion.   . Arthritis   . CHF (congestive heart failure) ([place])   . Constipation   . Depression with anxiety   . Diverticula, colon   . 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  Had deceased donor kidney transplant in 1996.  Had some early rejection then stable function for years, then had slow decline of function and went back on hemodialysis in 2012.  Gets HD TTS schedule at [facility] on [facility] still using L forearm AVF.     Marland Kitchen GERD (gastroesophageal reflux disease)   . Headache   . Hyperlipidemia   . Hypertension   . Neuromuscular disorder (HCC)    neuropathy hand and legs  . Osteoporosis   . Pneumonia   . 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 Stroke [facility]) 11/2015   TIA  . Weight loss, unintentional     Patient Active Problem List   Diagnosis Date Noted    . Atrial fibrillation ([place]) 03/23/2017  . Prolonged Q-T interval on ECG 02/24/2017  . Hypokalemia 02/24/2017  . Acute ischemic stroke ([place]) 02/23/2017  . Malnutrition of moderate degree 12/24/2016  . Problem with dialysis access ([place]) 12/21/2016  . Acute ischemic colitis ([place]) 05/16/2016  . Colitis 05/15/2016  . Rectal bleeding 05/15/2016  . Neurologic abnormality 11/19/2015  . Altered mental state 11/19/2015  . Hyperkalemia 11/19/2015  . Anxiety 11/19/2015  . Insomnia 11/19/2015  . ESRD (end stage renal disease) on dialysis ([place])   . Gait instability   . Stroke-like symptom 11/16/2015  . Vestibular neuritis 11/16/2015  . Stroke (cerebrum) ([place]) 11/16/2015  . Dizziness 05/09/2015  . Ataxia 05/09/2015  . H/O: CVA (cerebrovascular accident) 05/09/2015  . Left facial numbness 05/09/2015  . Left leg numbness 05/09/2015  . Hyperlipidemia   . CHF (congestive heart failure) ([place])   . Congestive heart disease ([place])   . Shortness of breath 04/01/2013  . Volume overload 04/01/2013  . HTN (hypertension) 04/01/2013  . Cholecystitis, acute 11/07/2012  . ESRD (end stage renal disease) ([place]) 11/07/2012  . GERD (gastroesophageal reflux disease) 11/07/2012  . Anemia in CKD (chronic kidney disease) 11/07/2012    Past Surgical History:  Procedure Laterality Date  . AV FISTULA PLACEMENT     for dialysis  . AV FISTULA PLACEMENT Left 11/22/2015   Procedure: ARTERIOVENOUS (AV) FISTULA CREATION-LEFT BRACHIOCEPHALIC;  Surgeon: Butch Penny  Trula Slade, MD;  Location: [facility];  Service: Vascular;  Laterality: Left;  . BACK SURGERY    . CERVICAL FUSION    . CHOLECYSTECTOMY  12/02/2012   Procedure: LAPAROSCOPIC CHOLECYSTECTOMY WITH INTRAOPERATIVE CHOLANGIOGRAM;  Surgeon: Edward Jolly, MD;  Location: [place];  Service: General;  Laterality: N/A;  . EYE SURGERY Bilateral    cataract surgery  . HEMATOMA EVACUATION Left 12/24/2016   Procedure: EVACUATION HEMATOMA LEFT UPPER ARM;  Surgeon: Waynetta Sandy, MD;  Location: [place] [facility];  Service: Vascular;  Laterality: Left;  . I&D EXTREMITY Left 12/31/2016   Procedure: IRRIGATION AND DEBRIDEMENT EXTREMITY;  Surgeon: Angelia Mould, MD;  Location: [facility];  Service: Vascular;  Laterality: Left;  . INSERTION OF DIALYSIS CATHETER Right 12/24/2016   Procedure: INSERTION OF DIALYSIS CATHETER;  Surgeon: Waynetta Sandy, MD;  Location: [place];  Service: Vascular;  Laterality: Right;  . INSERTION OF DIALYSIS CATHETER Right 02/04/2017   Procedure: INSERTION OF DIALYSIS CATHETER;  Surgeon: Waynetta Sandy, MD;  Location: [facility];  Service: Vascular;  Laterality: Right;  . KIDNEY TRANSPLANT  1996  . PERIPHERAL VASCULAR CATHETERIZATION Left 10/23/2016   Procedure: Fistulagram;  Surgeon: Elam Dutch, MD;  Location: [facility] CV [facility];  Service: Cardiovascular;  Laterality: Left;  . RESECTION OF ARTERIOVENOUS FISTULA ANEURYSM Left 11/22/2015   Procedure: RESECTION OF LEFT RADIOCEPHALIC FISTULA ANEURYSM ;  Surgeon: Serafina Mitchell, MD;  Location: [place];  Service: Vascular;  Laterality: Left;  . REVISON OF ARTERIOVENOUS FISTULA Left 12/22/2016   Procedure: REVISON OF LEFT ARTERIOVENOUS FISTULA;  Surgeon: Waynetta Sandy, MD;  Location: [place];  Service: Vascular;  Laterality: Left;  . REVISON OF ARTERIOVENOUS FISTULA Left 02/04/2017   Procedure: REVISON OF LEFT UPPER ARM ARTERIOVENOUS FISTULA;  Surgeon: Waynetta Sandy, MD;  Location: [place];  Service: Vascular;  Laterality: Left;    OB History    No data available       Home Medications    Prior to Admission medications   Medication Sig Start Date End Date Taking? Authorizing Provider  acetaminophen (TYLENOL) 325 MG tablet Take 650 mg by mouth every 6 (six) hours as needed.   Yes [provider]  allopurinol (ZYLOPRIM) 300 MG tablet Take 150 mg by mouth at bedtime.   Yes [provider]  ALPRAZolam (XANAX) 0.25 MG tablet Take 0.25 mg by mouth  2 (two) times daily as needed for anxiety.    Yes [provider]  B Complex-C-Folic Acid (DIALYVITE PO) Take 1 tablet by mouth daily.   Yes [provider]  labetalol (NORMODYNE) 200 MG tablet Take 200 mg by mouth 2 (two) times daily.    Yes [provider]  lactulose (CHRONULAC) 10 GM/15ML solution Take 20 g by mouth daily as needed for mild constipation.   Yes [provider]  lanthanum (FOSRENOL) 1000 MG chewable tablet Chew 1,000 mg by mouth 2 (two) times daily with a meal.   Yes [provider]  losartan (COZAAR) 100 MG tablet Take 100 mg by mouth at bedtime.    Yes [provider]  meclizine (ANTIVERT) 25 MG tablet Take 1 tablet (25 mg total) by mouth 2 (two) times daily as needed for dizziness. 06/24/16  Yes Merryl Hacker, MD  SENSIPAR 30 MG tablet Take 30 mg by mouth every Monday, Wednesday, and Friday with hemodialysis.  08/20/16  Yes [provider]  simvastatin (ZOCOR) 20 MG tablet Take 1 tablet (20 mg total)  by mouth at bedtime. Patient taking differently: Take 10 mg by mouth at bedtime.  02/26/17  Yes Velvet Bathe, MD  zolpidem (AMBIEN) 10 MG tablet take 10 mg in the evening   Yes [provider]  clopidogrel (PLAVIX) 75 MG tablet Take 1 tablet (75 mg total) by mouth daily. Patient not taking: Reported on 09/30/2017 02/26/17   Velvet Bathe, MD  diclofenac sodium (VOLTAREN) 1 % GEL Apply 2 g topically 4 (four) times daily. 09/30/17   Lynsay Fesperman, Elmyra Ricks, PA-C  doxazosin (CARDURA) 8 MG tablet Take 1 tablet (8 mg total) by mouth at bedtime. Patient not taking: Reported on 09/30/2017 12/26/16   Alvia Grove, PA-C  HYDROcodone-acetaminophen (NORCO/VICODIN) 5-325 MG tablet Take 1-2 tablets by mouth every 6 hours as needed for pain and/or cough. 09/30/17   Darice Vicario, Elmyra Ricks, PA-C    Family History Family History  Problem Relation Age of Onset  . Colon cancer Brother   . Cancer Brother   . Coronary artery  disease Mother 21  . Hyperlipidemia Mother   . Hypertension Mother   . Stroke Maternal Aunt   . Esophageal cancer Neg Hx   . Stomach cancer Neg Hx   . Rectal cancer Neg Hx     Social History Social History  Substance Use Topics  . Smoking status: Former Smoker    Types: Cigarettes    Quit date: 12/31/1991  . Smokeless tobacco: Never Used  . Alcohol use No     Allergies   Sulfa antibiotics and Adhesive [tape]   Review of Systems Review of Systems  A complete review of systems was obtained and all systems are negative except as noted in the HPI and PMH.    Physical Exam Updated Vital Signs BP (!) 147/69   Pulse 72   Temp 98.2 F (36.8 C) (Oral)   Resp 12   SpO2 98%   Physical Exam  Constitutional: She is oriented to person, place, and time. She appears well-developed and well-nourished. No distress.  HENT:  Head: Normocephalic.  Mouth/Throat: Oropharynx is clear and moist.  Eyes: Conjunctivae are normal.  Neck: Normal range of motion. No JVD present. No tracheal deviation present.  Cardiovascular: Normal rate, regular rhythm and intact distal pulses.   Fistula to left upper extremity with good thrill  Pulmonary/Chest: Effort normal and breath sounds normal. No stridor. No respiratory distress. She has no wheezes. She has no rales. She exhibits tenderness.    Chest tenderness as diagrammed. No underlying crepitance.  Abdominal: Soft. She exhibits no distension and no mass. There is no tenderness. There is no rebound and no guarding.  Musculoskeletal: Normal range of motion. She exhibits no edema or tenderness.  No calf asymmetry, superficial collaterals, palpable cords, edema, Homans sign negative bilaterally.    Neurological: She is alert and oriented to person, place, and time.  Skin: Skin is warm. She is not diaphoretic.  Psychiatric: She has a normal mood and affect.  Nursing note and vitals reviewed.    ED Treatments / Results  Labs (all labs ordered  are listed, but only abnormal results are displayed) Labs Reviewed  BASIC METABOLIC PANEL - Abnormal; Notable for the following:       Result Value   Chloride 90 (*)    BUN 26 (*)    Creatinine, Ser 5.83 (*)    Calcium 8.8 (*)    GFR calc non Af Amer 7 (*)    GFR calc Af Amer 8 (*)    All other  components within normal limits  CBC - Abnormal; Notable for the following:    RBC 3.73 (*)    Hemoglobin 11.4 (*)    HCT 35.7 (*)    RDW 16.7 (*)    All other components within normal limits  I-STAT TROPONIN, ED  I-STAT TROPONIN, ED    EKG  EKG Interpretation  Date/Time:  Thursday September 30 2017 15:25:06 EDT Ventricular Rate:  78 PR Interval:  168 QRS Duration: 74 QT Interval:  424 QTC Calculation: 483 R Axis:   81 Text Interpretation:  Normal sinus rhythm Cannot rule out Anterior infarct , age undetermined Abnormal ECG Anterior T-wave abnormality Confirmed by Brantley Stage [phone]) on 09/30/2017 6:02:17 PM       Radiology Dg Chest 2 View  Result Date: 09/30/2017 CLINICAL DATA:  Left rib pain.  No reported injury. EXAM: CHEST  2 VIEW COMPARISON:  05/12/2017 chest radiograph. FINDINGS: Stable cardiomediastinal silhouette with mild cardiomegaly and mildly tortuous atherosclerotic thoracic aorta. No pneumothorax. No significant pleural effusion. Mildly hyperinflated lungs, unchanged. No overt pulmonary edema. Stable chronic postinflammatory micronodularity in the upper lobes. Mild scarring at the left lung base. No acute consolidative airspace disease. Healed left seventh rib deformity. No acute displaced fractures in the visualized chest. IMPRESSION: 1. Stable cardiomegaly without overt pulmonary edema. 2. Stable mildly hyperinflated lungs, suggesting chronic obstructive lung disease. 3. No acute pulmonary disease. Electronically Signed   By: Ilona Sorrel M.D.   On: 09/30/2017 16:57   Ct Angio Chest Pe W And/or Wo Contrast  Result Date: 09/30/2017 CLINICAL DATA:  Acute chest pain and  shortness of breath EXAM: CT ANGIOGRAPHY CHEST WITH CONTRAST TECHNIQUE: Multidetector CT imaging of the chest was performed using the standard protocol during bolus administration of intravenous contrast. Multiplanar CT image reconstructions and MIPs were obtained to evaluate the vascular anatomy. CONTRAST:  80 mL Isovue 370 COMPARISON:  Chest radiograph 09/30/2017 FINDINGS: Cardiovascular: Contrast injection is sufficient to demonstrate satisfactory opacification of the pulmonary arteries to the segmental level. There is no pulmonary embolus. The main pulmonary artery is mildly enlarged, measuring 3.4 cm at the bifurcation. There is no CT evidence of acute right heart strain. There is calcific atherosclerosis of the aorta. There is a normal variant aortic arch branching pattern with the brachiocephalic and left common carotid arteries sharing a common origin. There is a markedly tortuous course of the left subclavian artery. This exerts mass effect on the left aspect of the esophagus. Heart size is enlarged. There are coronary artery calcifications. Mediastinum/Nodes: Rightward deviation of the thoracic esophagus secondary to mass effect by tortuous left subclavian artery. Normal thyroid. No mediastinal lymphadenopathy. No axillary adenopathy. Lungs/Pleura: Areas of dense peripheral tree-in-bud opacity are noted in the right upper lobe and right lower lobe. To a lesser extent, these are present in the left lung. There is a small left pleural effusion. Bibasilar atelectasis. Central airways are clear. Upper Abdomen: Contrast bolus timing is not optimized for evaluation of the abdominal organs. Within this limitation, the visualized organs of the upper abdomen are normal. Musculoskeletal: No chest wall abnormality. No acute or significant osseous findings. Review of the MIP images confirms the above findings. IMPRESSION: 1. No pulmonary embolus. 2. Enlarged main pulmonary artery, which may indicate underlying  pulmonary hypertension. Aortic Atherosclerosis (ICD10-I70.0). 3. Multiple clusters of dense, nodular tree-in-bud opacities within both lungs. Etiology is uncertain but may indicate silicosis or other inhalational/hypersensitivity syndrome. Chronic infection, such as with non-tuberculous mycobacteria, is another possibility. Electronically Signed   By:  Ulyses Jarred M.D.   On: 09/30/2017 19:33    Procedures Procedures (including critical care time)  Medications Ordered in ED Medications  aspirin chewable tablet 324 mg (324 mg Oral Given 09/30/17 1737)  iopamidol (ISOVUE-370) 76 % injection (100 mLs  Contrast Given 09/30/17 1843)     Initial Impression / Assessment and Plan / ED Course  I have reviewed the triage vital signs and the nursing notes.  Pertinent labs & imaging results that were available during my care of the patient were reviewed by me and considered in my medical decision making (see chart for details).     Vitals:   09/30/17 1715 09/30/17 1815 09/30/17 1830 09/30/17 2015  BP: (!) 154/77 (!) 149/77 140/79 (!) 147/69  Pulse:  73 74 72  Resp: (!) 21 15 17 12   Temp:      TempSrc:      SpO2:  98% 98% 98%    Medications  aspirin chewable tablet 324 mg (324 mg Oral Given 09/30/17 1737)  iopamidol (ISOVUE-370) 76 % injection (100 mLs  Contrast Given 09/30/17 1843)    [name] is 66 y.o. female presenting with Left-sided reproducible and pleuritic chest pain. Lung sounds are clear, she was fully dialyzed yesterday. This should be very atypical for ACS, EKG is without acute findings.digital troponin negative.Given the pleuritic nature of her pain would consider PE, clinically not likely DVT in her vital signs are reassuring. She's been on dialysis since 2012, I think she is appropriate for dye load of DVT study.  CTA negative for PE however they do note enlarged main pulmonary artery and multiple clusters of nodular tree and blood bed opacities within both lungs. I  feel this is unlikely the cause of her discomfort. Discussed with attending physician who agrees that she is appropriate for pulmonology evaluation as an outpatient. Patient given referral to [facility] Heckler, I've advised her also to make her primary care physician aware that she was seen and evaluated in the ED.  This is a shared visit with the attending physician who personally evaluated the patient and agrees with the care plan.   Repeat troponin negative.  Evaluation does not show pathology that would require ongoing emergent intervention or inpatient treatment. Pt is hemodynamically stable and mentating appropriately. Discussed findings and plan with patient/guardian, who agrees with care plan. All questions answered. Return precautions discussed and outpatient follow up given.      Final Clinical Impressions(s) / ED Diagnoses   Final diagnoses:  Chest wall pain  Abnormal CT of the chest    New Prescriptions Discharge Medication List as of 09/30/2017  8:46 PM    START taking these medications   Details  diclofenac sodium (VOLTAREN) 1 % GEL Apply 2 g topically 4 (four) times daily., Starting Thu 09/30/2017, Print    HYDROcodone-acetaminophen (NORCO/VICODIN) 5-325 MG tablet Take 1-2 tablets by mouth every 6 hours as needed for pain and/or cough., Print         Sameria Morss, Charna Elizabeth 09/30/17 2114    Forde Dandy, MD 09/30/17 [phone]

## 2017-09-30 NOTE — ED Triage Notes (Signed)
Pt reports left rib pain when breathing in and lower back pain for 2 days. Denies chest pain, SOB, N/V/D.

## 2017-09-30 NOTE — ED Notes (Signed)
Pt understood dc material. NAD noted. Scripts given at dc 

## 2017-09-30 NOTE — ED Notes (Signed)
Pt states he doesn't make urine

## 2017-10-01 DIAGNOSIS — N186 End stage renal disease: Secondary | ICD-10-CM | POA: Diagnosis not present

## 2017-10-01 DIAGNOSIS — N2581 Secondary hyperparathyroidism of renal origin: Secondary | ICD-10-CM | POA: Diagnosis not present

## 2017-10-01 DIAGNOSIS — D631 Anemia in chronic kidney disease: Secondary | ICD-10-CM | POA: Diagnosis not present

## 2017-10-02 IMAGING — CT CT HEAD CODE STROKE
3 series · 15 of 45 positions shown, 18 images · non-contrast
Comparison: Brain MRI 06/24/2016.  Head CT 06/11/2016, and earlier.

CLINICAL DATA: Code stroke. 65-year-old female with dizziness
lethargy, generalized weakness, altered mental status.

EXAM:
CT HEAD WITHOUT CONTRAST
TECHNIQUE: Contiguous axial images were obtained from the base of the skull
through the vertex without intravenous contrast.

[Series 3: head 5.0 st · axial · 0.42mm/px · z∈[+1287,+1402]mm · 9 of 28 slices shown, 12 images]
[im 3/28  brain]
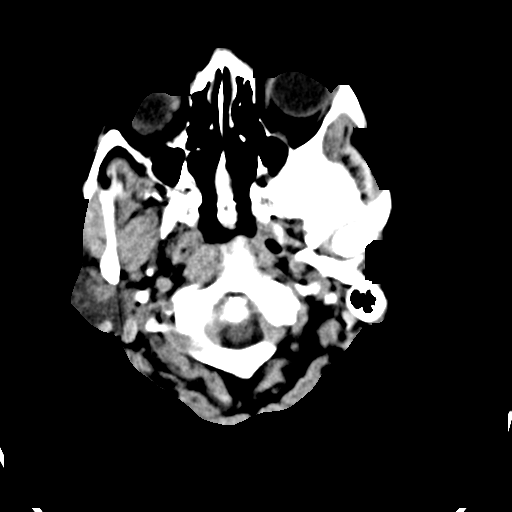
[im 3/28  bone]
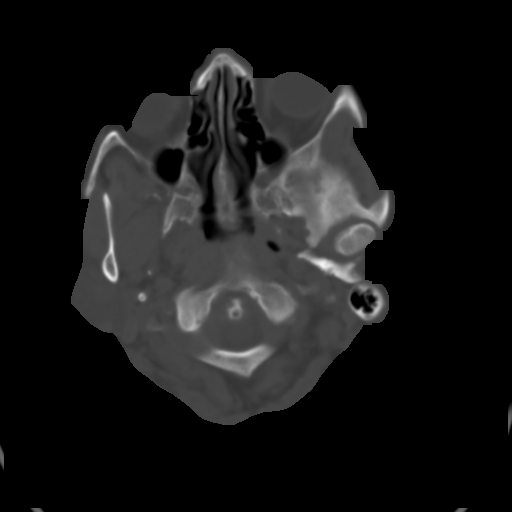
[im 6/28  brain]
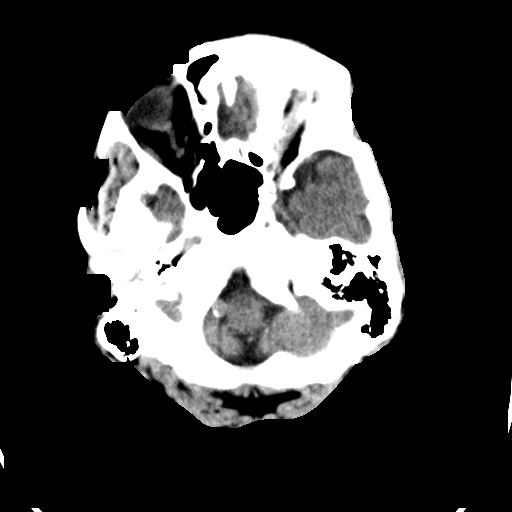
[im 9/28  brain]
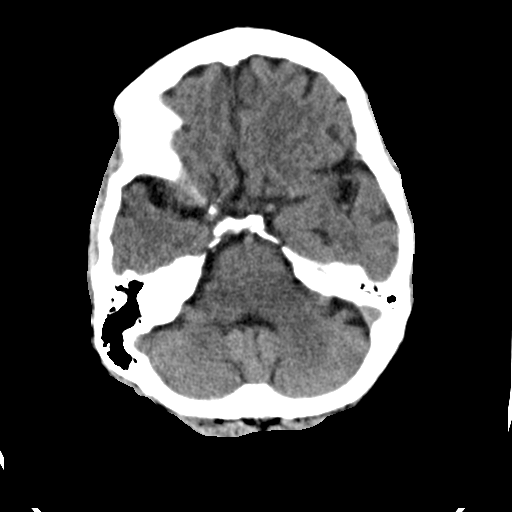
[im 12/28  brain]
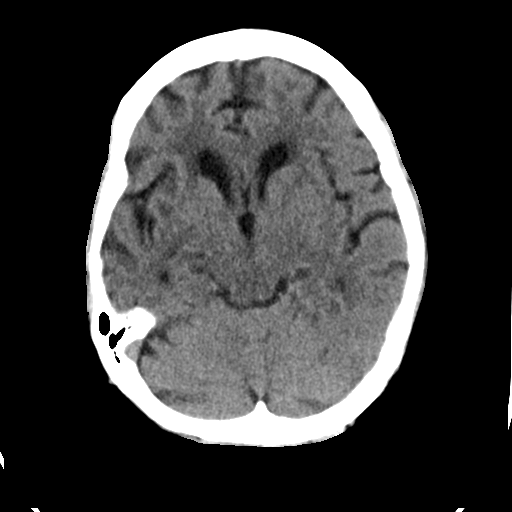
[im 15/28  brain]
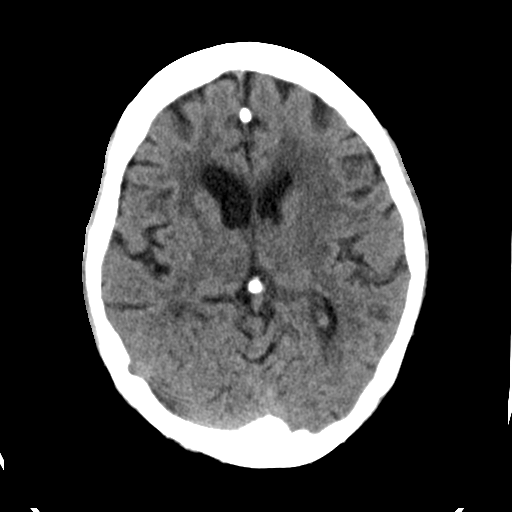
[im 15/28  bone]
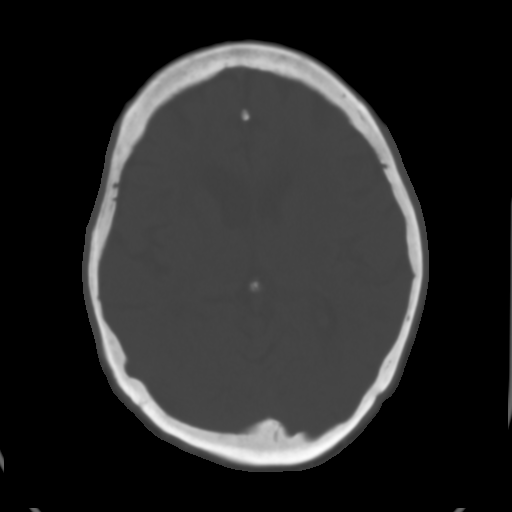
[im 17/28  brain]
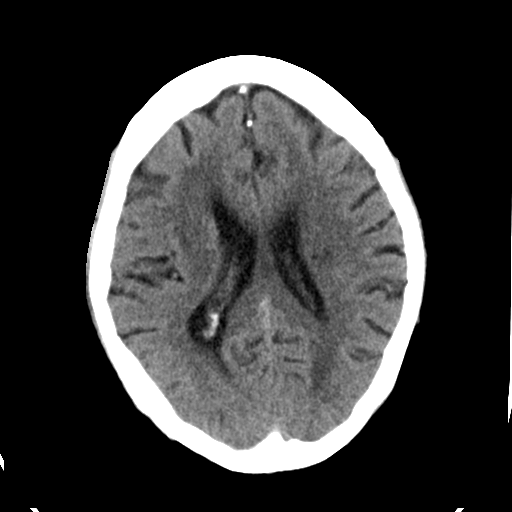
[im 20/28  brain]
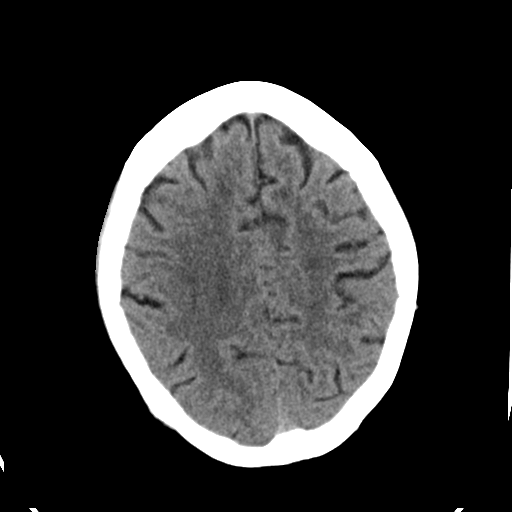
[im 23/28  brain]
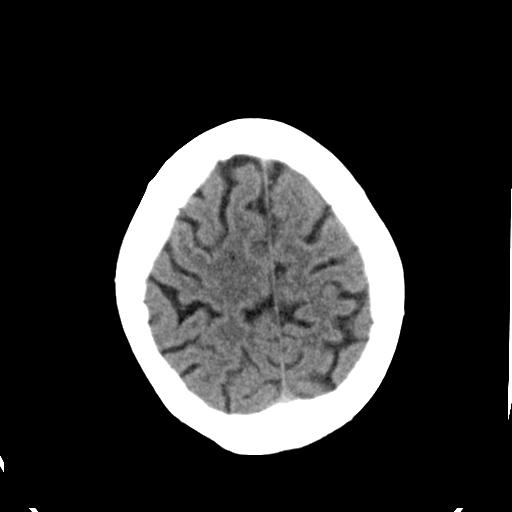
[im 26/28  brain]
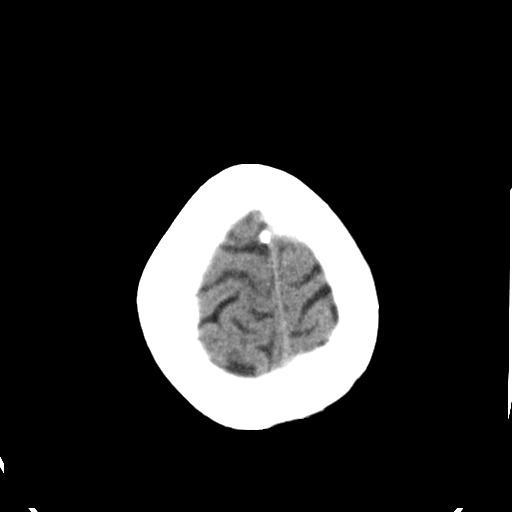
[im 26/28  bone]
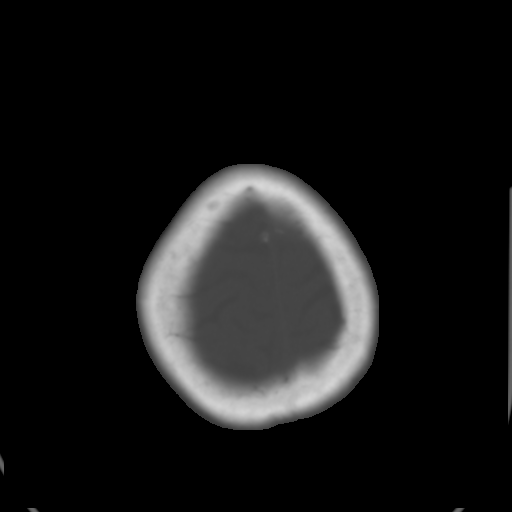

[Series 5: head 3.0 cor st · coronal · 0.29mm/px · 3 of 67 slices shown]
[im 23/67  brain]
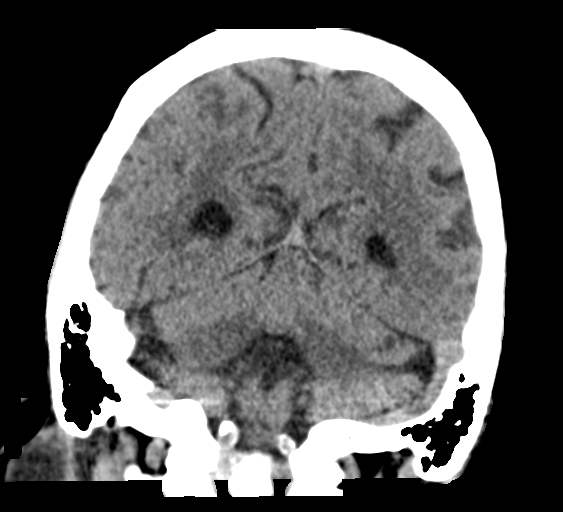
[im 30/67  brain]
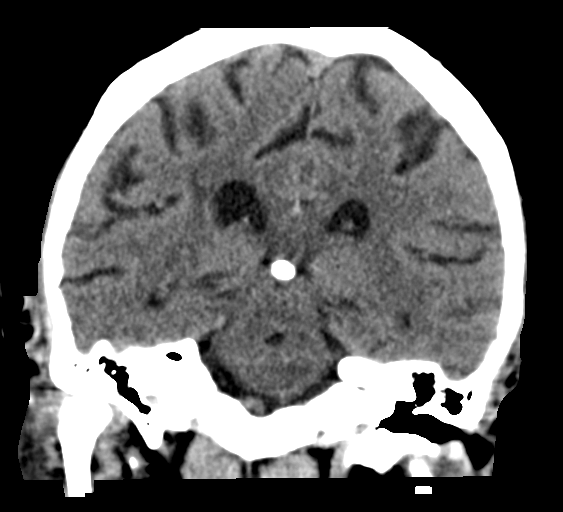
[im 37/67  brain]
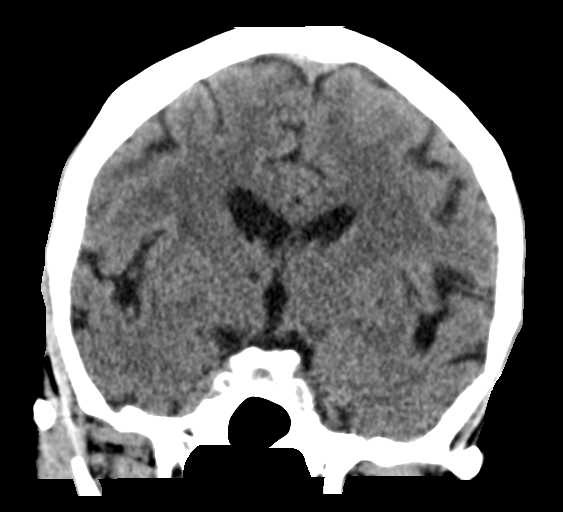

[Series 6: head 3.0 sag st · sagittal · 0.30mm/px · 3 of 55 slices shown]
[im 19/55  brain]
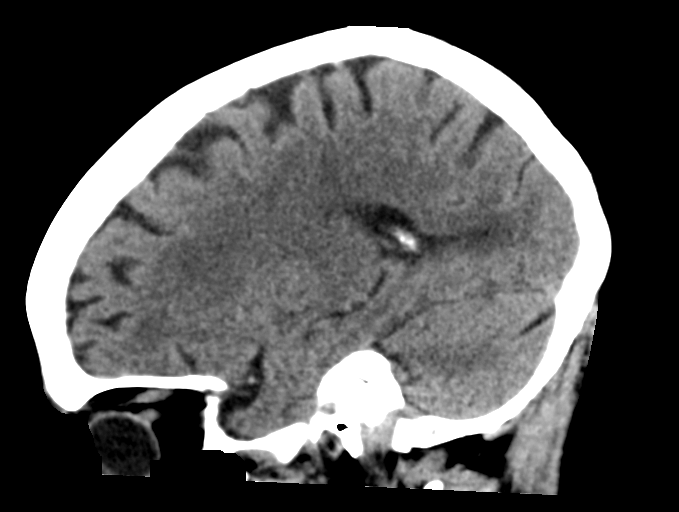
[im 28/55  brain]
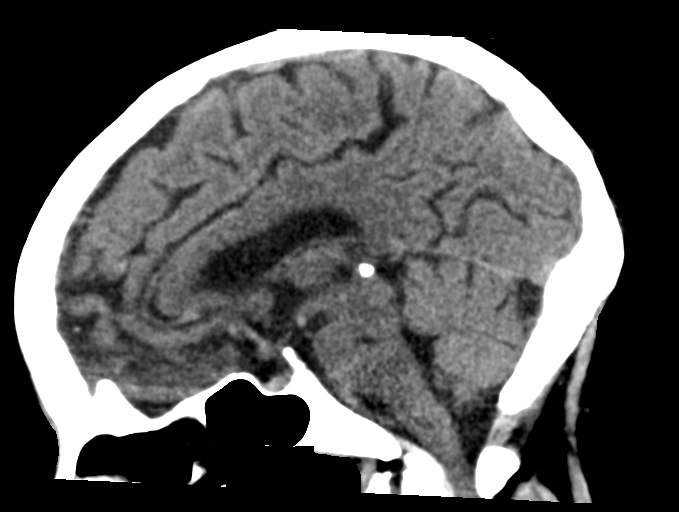
[im 37/55  brain]
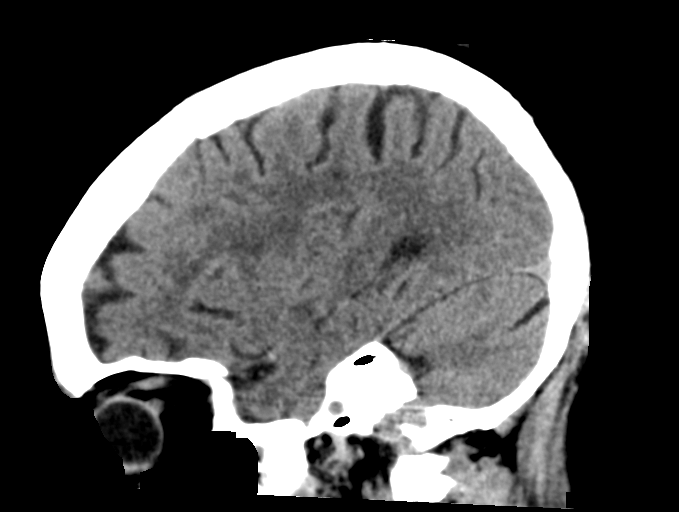

[15 of 45 positions shown; findings below may reference images not displayed]

FINDINGS: Brain: No acute intracranial hemorrhage identified. No midline
shift, mass effect, or evidence of intracranial mass lesion. No
ventriculomegaly.

Chronic moderate to severe heterogeneity in the cerebral white
matter, deep gray matter nuclei, and brainstem compatible with
advanced chronic small vessel disease.

No superimposed acute cortically based infarct identified.

Vascular: Calcified atherosclerosis at the skull base. No suspicious
intracranial vascular hyperdensity.

Skull: No acute osseous abnormality identified. Chronic moderate to
severe degenerative changes including suspected ligamentous
hypertrophy about the odontoid. Associated widening of the atlanto
dens interval.

Sinuses/Orbits: Visualized paranasal sinuses and mastoids are stable
and well pneumatized.

Other: Previous left forehead scalp hematoma. No acute orbit or
scalp soft tissue findings. Mild calcified scalp vessel
atherosclerosis.

ASPECTS (Alberta Stroke Program Early CT Score)

- Ganglionic level infarction (caudate, lentiform nuclei, internal
capsule, insula, M1-M3 cortex): 7

- Supraganglionic infarction (M4-M6 cortex): 3

Total score (0-10 with 10 being normal): 10
IMPRESSION: 1. No acute hemorrhage or acute cortically based infarct identified.
Advanced small vessel disease.
2. ASPECTS is 10.
3. The above was relayed via text pager to Dr. Byamasu Komezadusabe On 02/23/2017

## 2017-10-02 IMAGING — MR MR HEAD W/O CM
9 of 11 series · 32 of 48 positions shown · non-contrast
Comparison: Head CT 8508 hours today. Brain MRI 06/24/2016 and
earlier.

CLINICAL DATA: 65-year-old female with confusion, generalize
weakness, recently missed hemodialysis. Initial encounter.

EXAM:
MRI HEAD WITHOUT CONTRAST
TECHNIQUE: Multiplanar, multiecho pulse sequences of the brain and surrounding
structures were obtained without intravenous contrast.

[Series 3: DWI · axial · 3.0mm · 0.94mm/px · z∈[-121,-3]mm · 7 of 88 slices shown (1 of 2)]
[im 1/88]
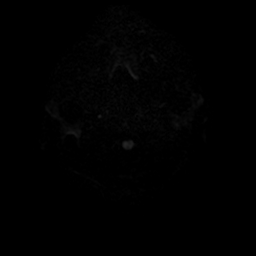
[im 15/88]
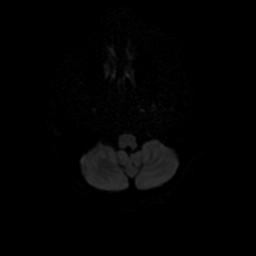
[im 30/88]
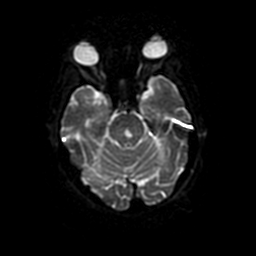
[im 44/88]
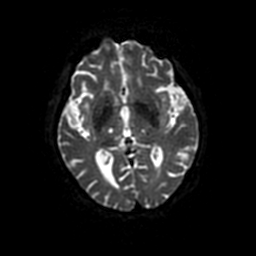
[im 59/88]
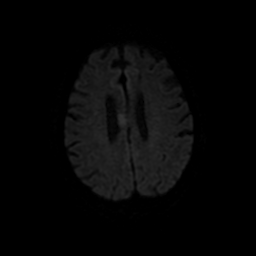
[im 73/88]
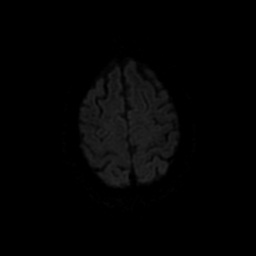
[im 88/88]
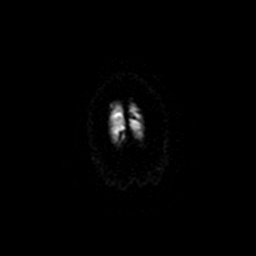

[Series 4: FLAIR · sagittal · 5.0mm · 0.47mm/px · 2 of 23 slices shown (1 of 2)]
[im 1/23]
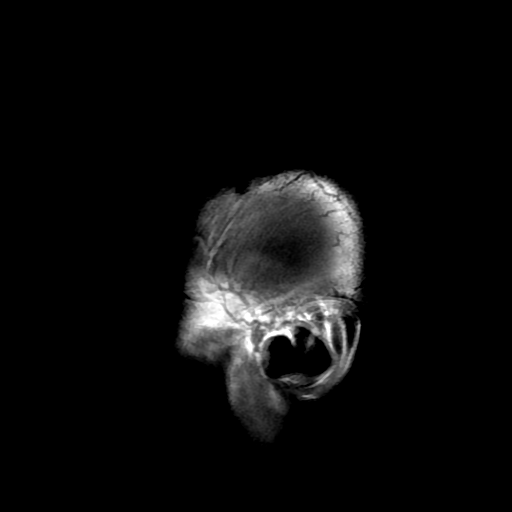
[im 23/23]
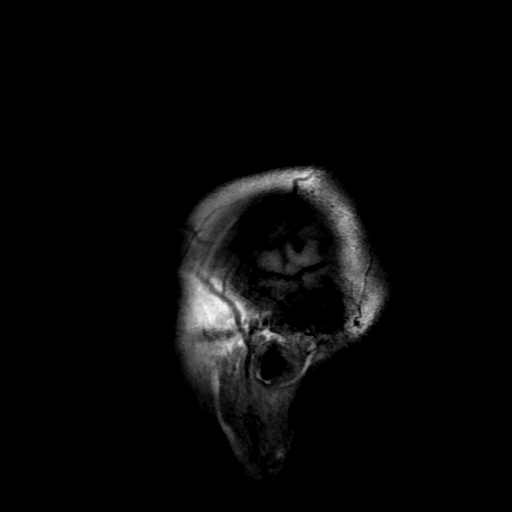

[Series 6: T2 · axial · 5.0mm · 0.47mm/px · z∈[-119,-4]mm · 2 of 22 slices shown (1 of 2)]
[im 1/22]
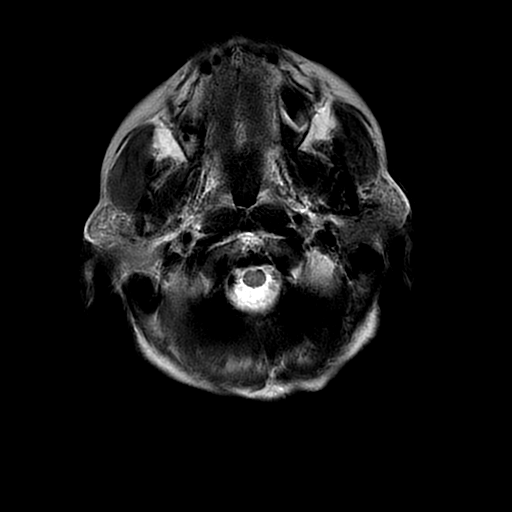
[im 22/22]
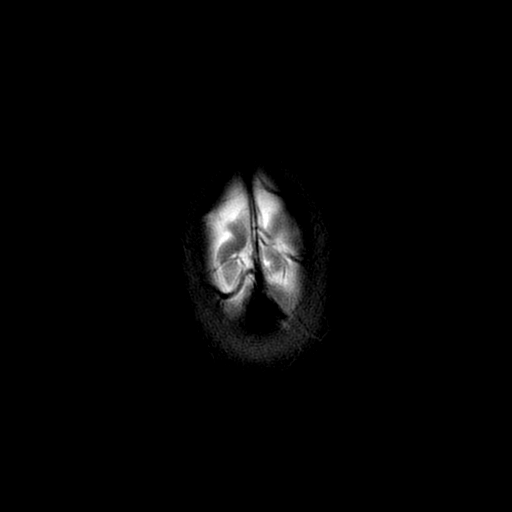

[Series 7: FLAIR · axial · 5.0mm · 0.47mm/px · z∈[-119,-4]mm · 2 of 22 slices shown (2 of 2)]
[im 1/22]
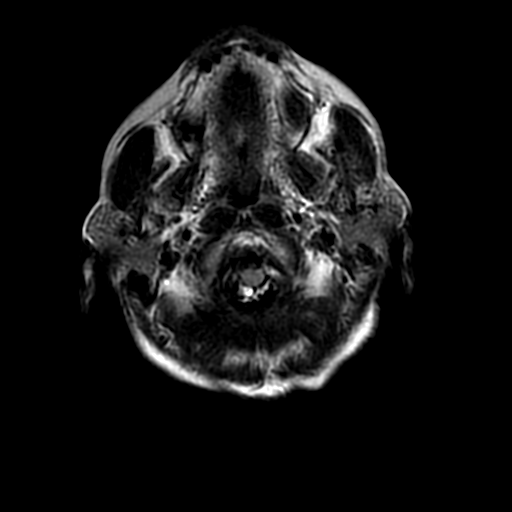
[im 22/22]
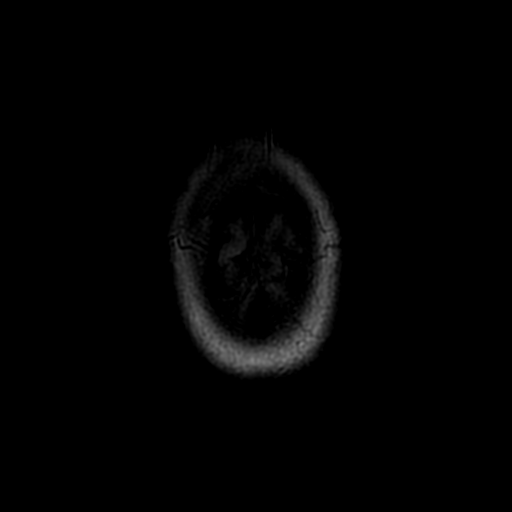

[Series 8: DWI · coronal · 4.0mm · 0.94mm/px · 6 of 66 slices shown (2 of 2)]
[im 1/66]
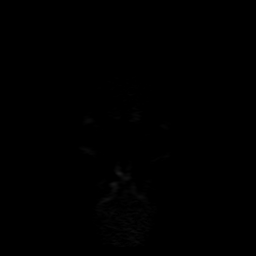
[im 14/66]
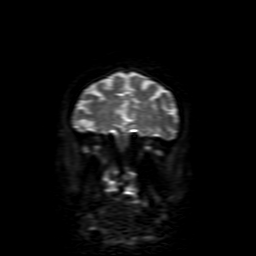
[im 27/66]
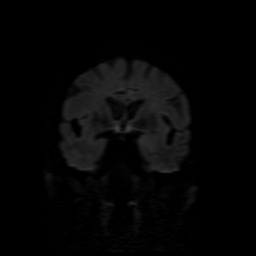
[im 40/66]
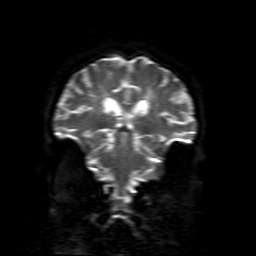
[im 53/66]
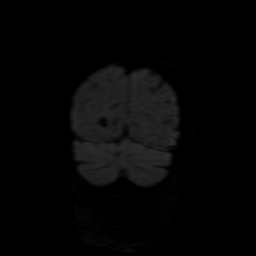
[im 66/66]
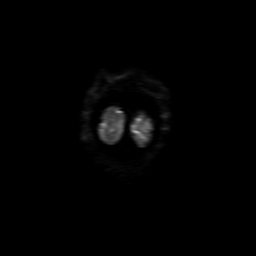

[Series 9: (person_name) · axial · 3.0mm · 0.47mm/px · z∈[-121,-70]mm · 4 of 88 slices shown]
[im 1/88]
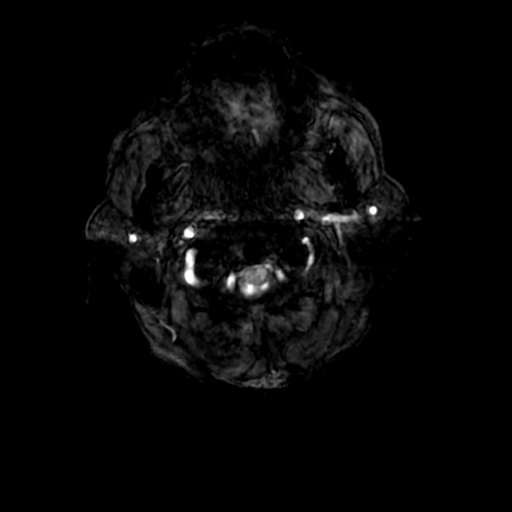
[im 13/88]
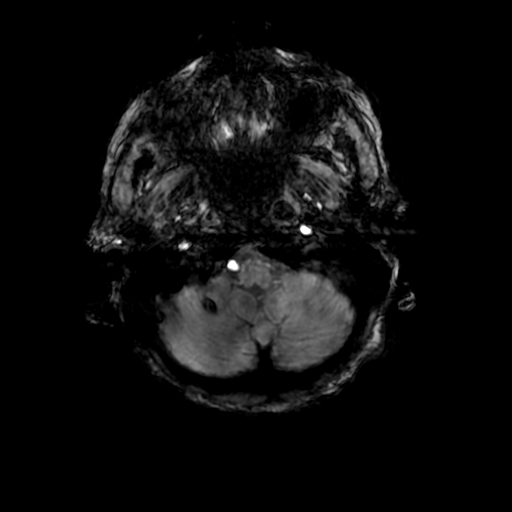
[im 25/88]
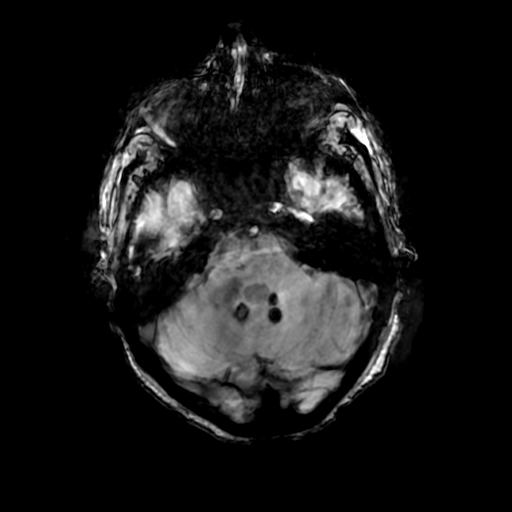
[im 38/88]
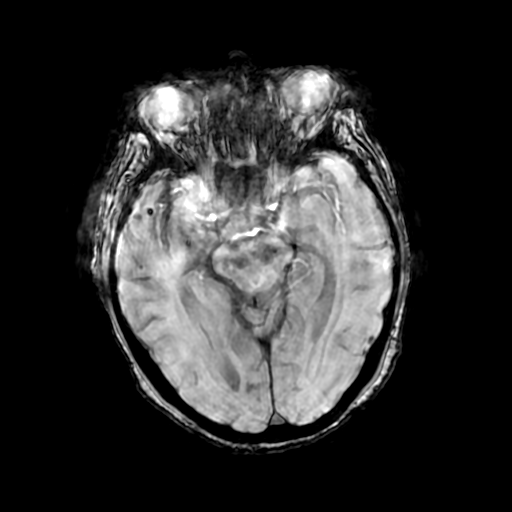

[Series 12: T2 · coronal · 5.0mm · 0.39mm/px · 2 of 27 slices shown (2 of 2)]
[im 1/27]
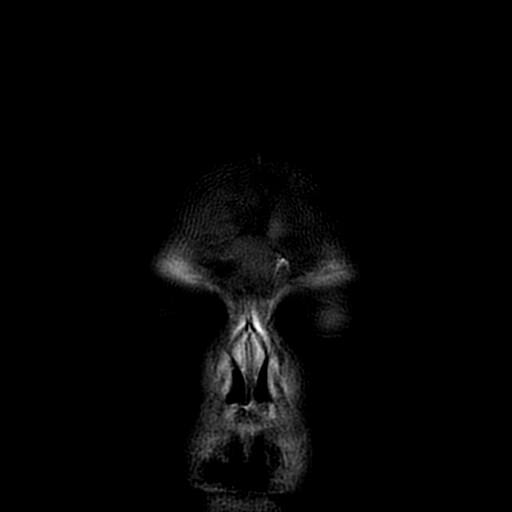
[im 27/27]
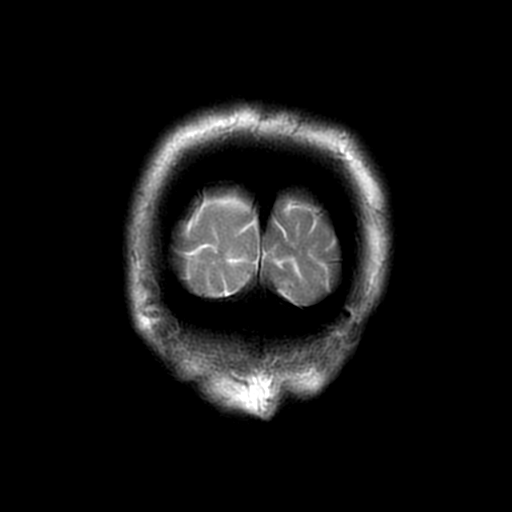

[Series 350: ADC · axial · 3.0mm · 0.94mm/px · z∈[-121,-3]mm · 4 of 44 slices shown (1 of 2)]
[im 1/44]
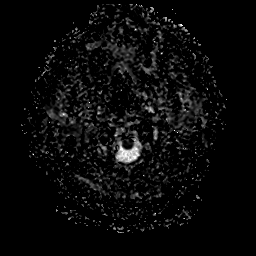
[im 15/44]
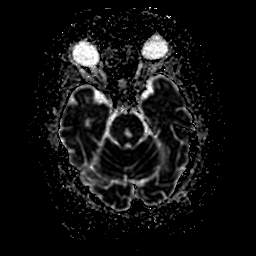
[im 29/44]
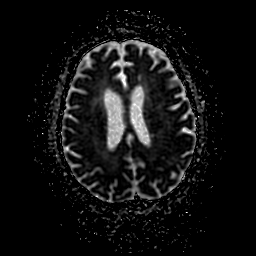
[im 44/44]
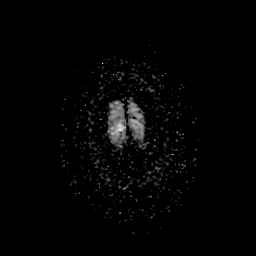

[Series 850: ADC · coronal · 4.0mm · 0.94mm/px · 3 of 33 slices shown (2 of 2)]
[im 1/33]
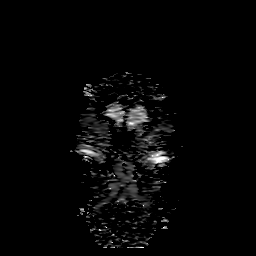
[im 17/33]
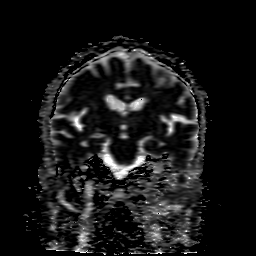
[im 33/33]
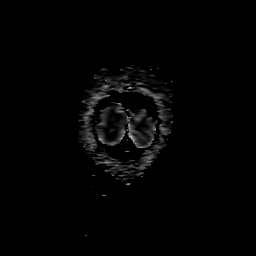

[32 of 48 positions shown; findings below may reference images not displayed]

FINDINGS: Brain: There is a small 9 mm focus of restricted diffusion along the
right body of the corpus callosum best seen on series 3, image 30.
There is subtle associated T2 and FLAIR hyperintensity (series 12,
image 14) with no significant mass effect. Punctate petechial
hemorrhaged possible (series 9, image 62).

No other restricted diffusion. Advanced chronic small vessel disease
with numerous chronic micro hemorrhages (maximal in the deep gray
matter, brainstem, and deep gray matter nuclei), and multiple
chronic lacunar infarcts in the deep gray matter nuclei and
brainstem - including a prominent central pontine lacune which is
chronic but new since June 2016 (series 6, image 8). Associated
chronic bilateral cerebral white matter T2 and FLAIR hyperintensity,
including deep white matter capsule involvement, has not
significantly changed. No cortical encephalomalacia identified.

No midline shift, mass effect, evidence of mass lesion,
ventriculomegaly, or extra-axial collection. Cervicomedullary
junction and pituitary are within normal limits.

Vascular: Major intracranial vascular flow voids appear stable since
7163 including dominant and dolichoectatic distal right vertebral
artery.

Skull and upper cervical spine: Stable.

Sinuses/Orbits: Stable and negative.

Other: Mastoid air cells are clear. Visible internal auditory
structures appear normal. Negative scalp soft tissues.
IMPRESSION: 1. Small acute on chronic white matter infarct along the body of the
right corpus callosum. Petechial hemorrhage but no associated mass
effect.
2. No other acute intracranial abnormality. Extensive chronic small
vessel disease with some progression elsewhere since June 2016.

## 2017-10-03 IMAGING — MR MR MRA HEAD W/O CM
1 series · 20 of 48 positions shown · non-contrast
Comparison: 05/09/2015

CLINICAL DATA: CVA.

EXAM:
MRA HEAD WITHOUT CONTRAST
TECHNIQUE: Angiographic images of the Circle of Willis were obtained using MRA
technique without intravenous contrast.

[Series 3: ax (id) 2 · axial · 1.0mm · 0.43mm/px · z∈[-61,+23]mm · 20 of 184 slices shown]
[im 1/184]
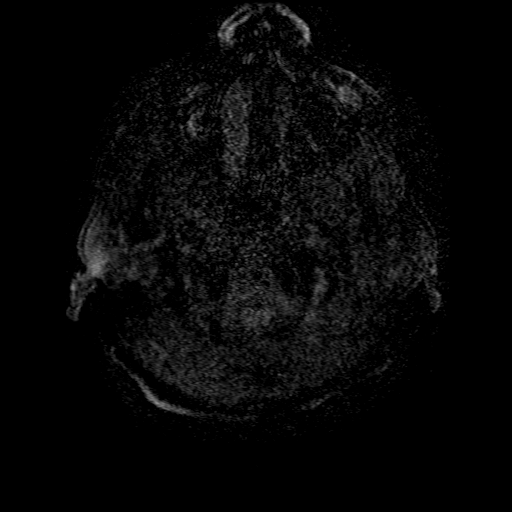
[im 4/184]
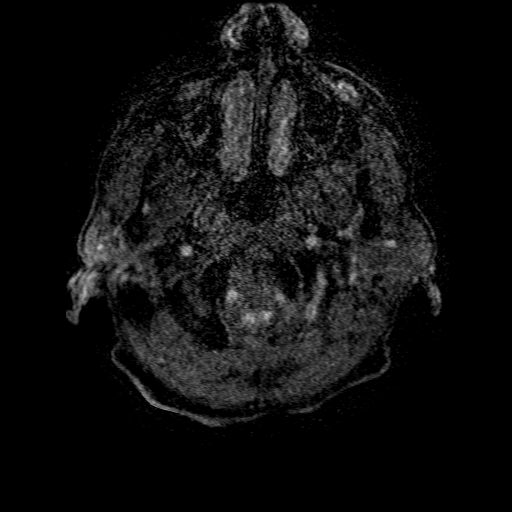
[im 8/184]
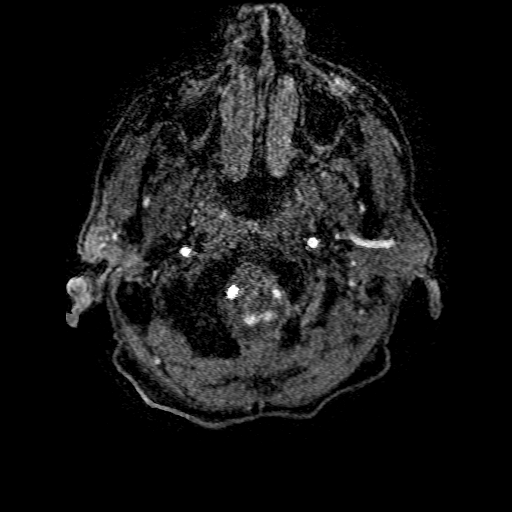
[im 12/184]
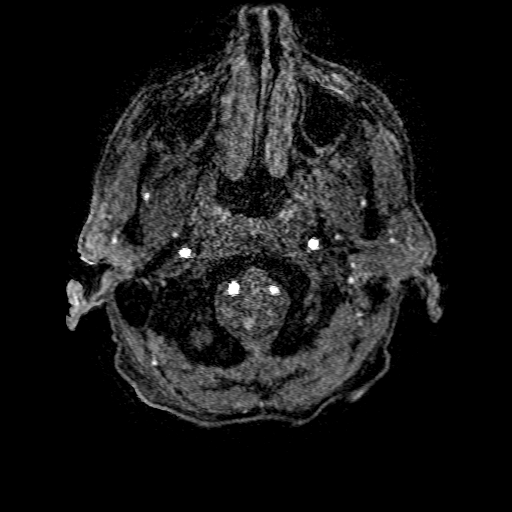
[im 16/184]
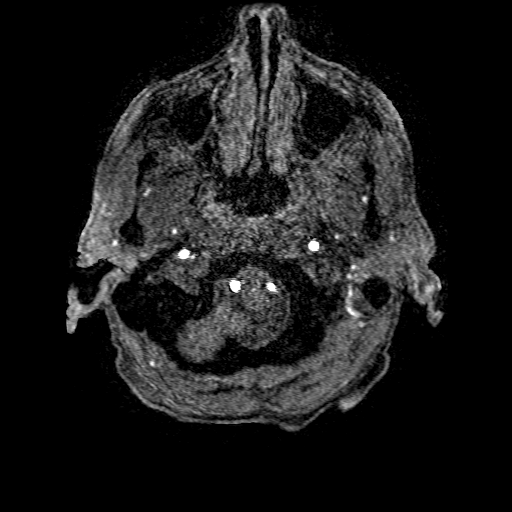
[im 20/184]
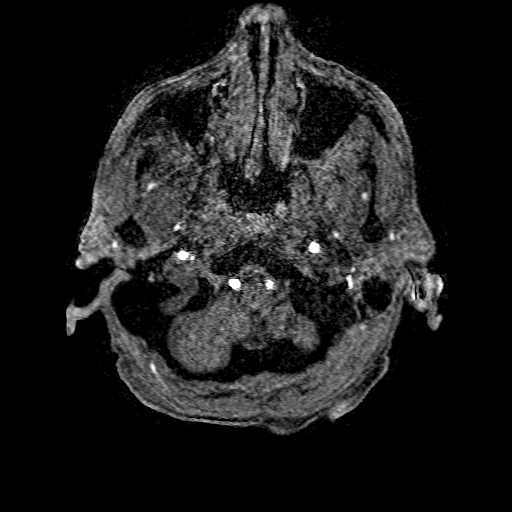
[im 24/184]
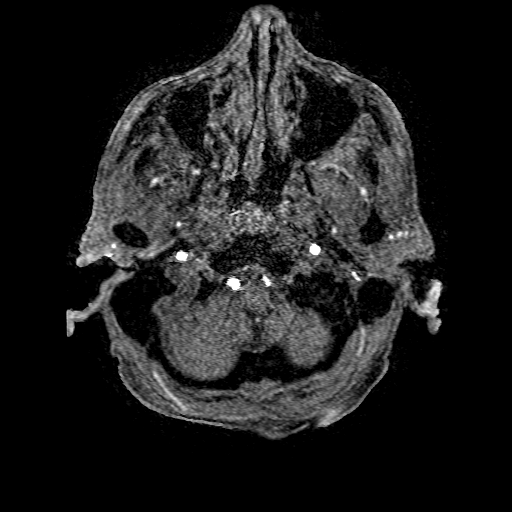
[im 28/184]
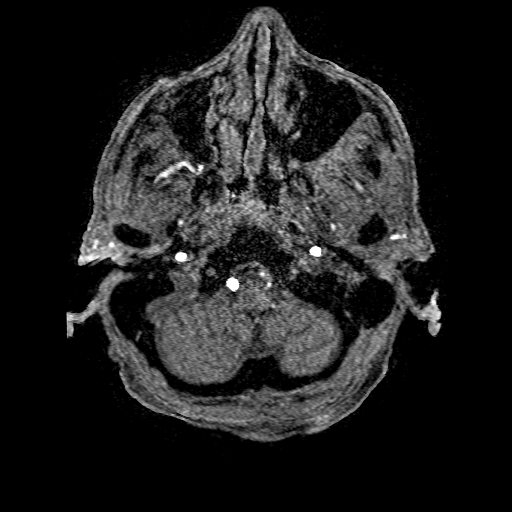
[im 32/184]
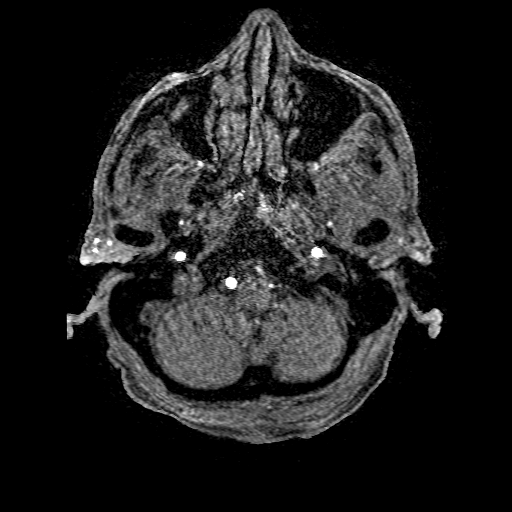
[im 36/184]
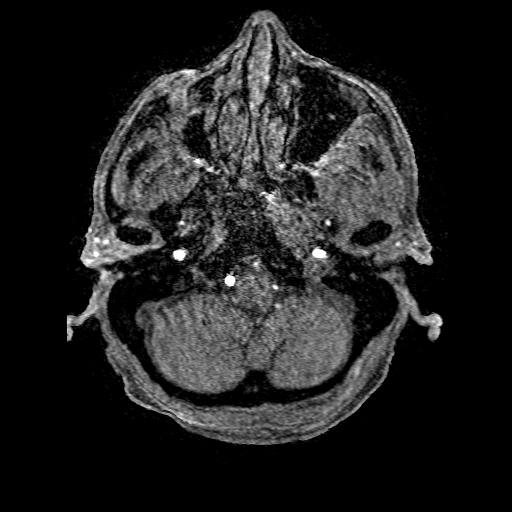
[im 39/184]
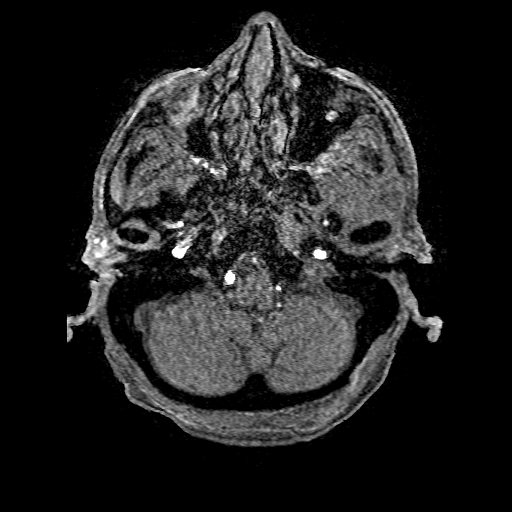
[im 43/184]
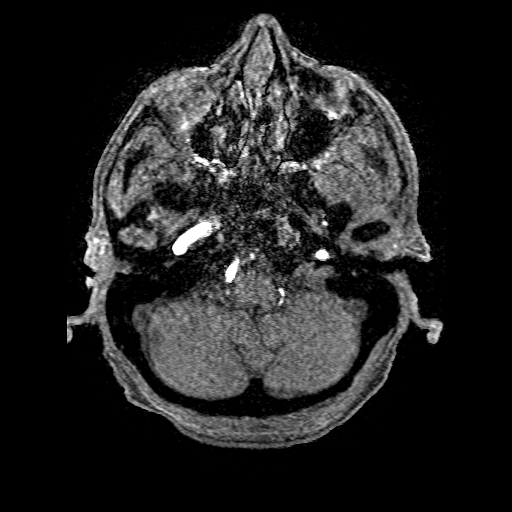
[im 59/184]
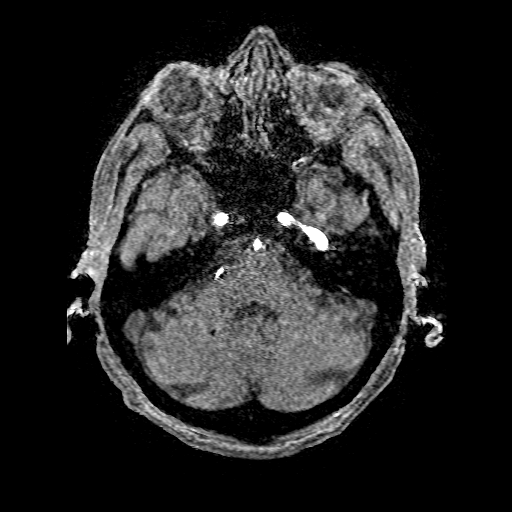
[im 82/184]
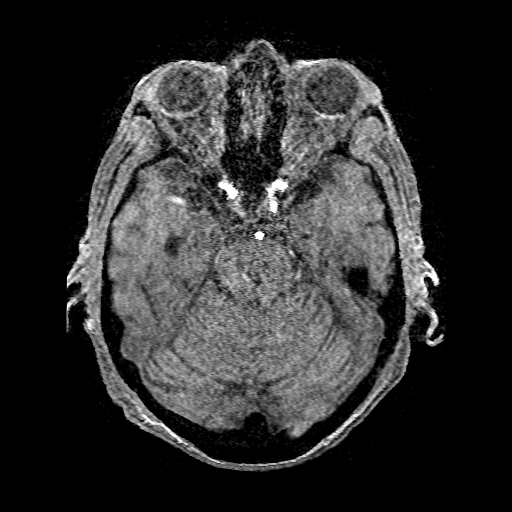
[im 94/184]
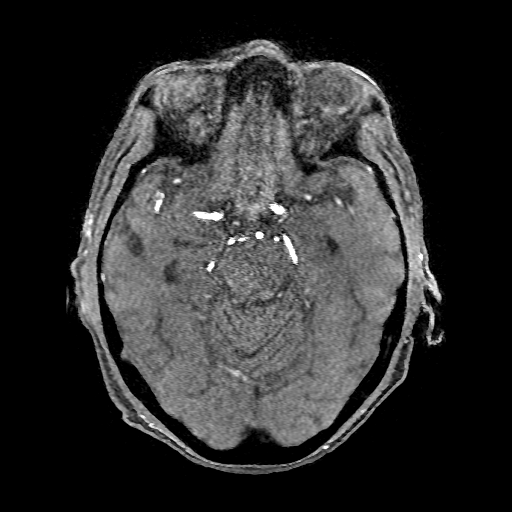
[im 106/184]
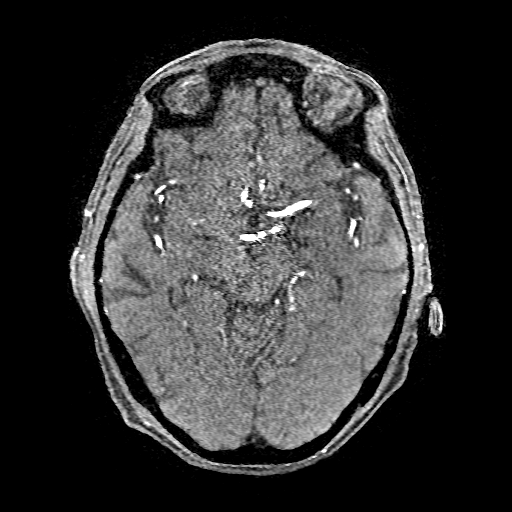
[im 129/184]
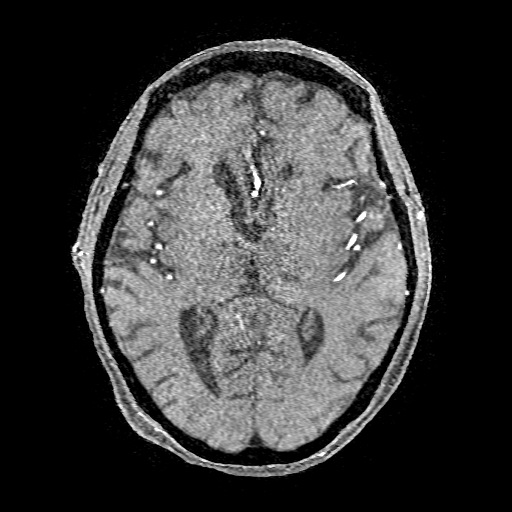
[im 152/184]
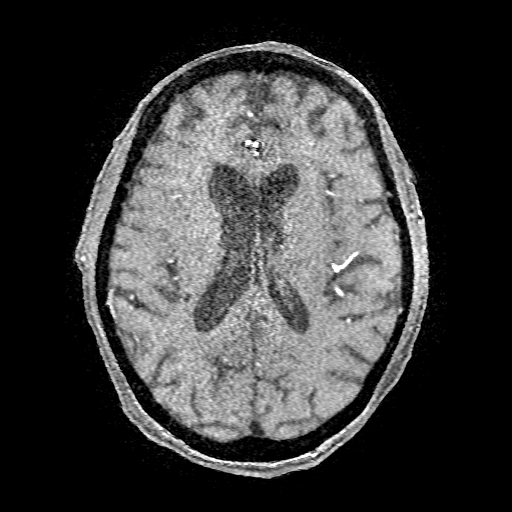
[im 156/184]
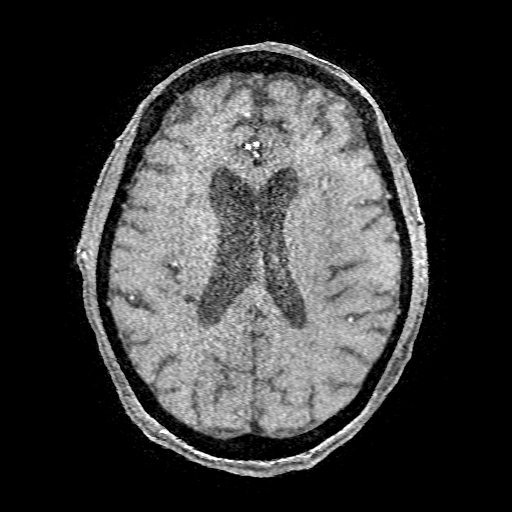
[im 176/184]
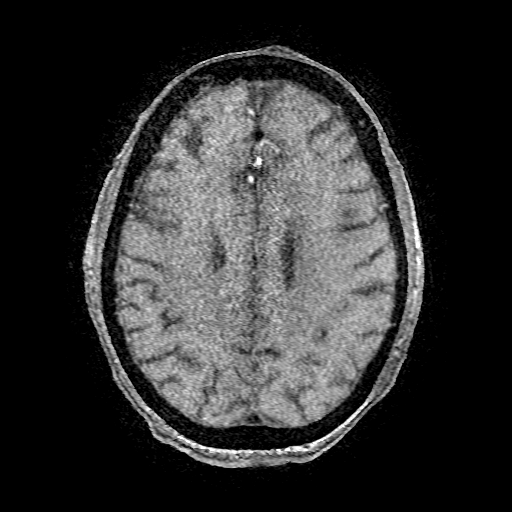

[20 of 48 positions shown; findings below may reference images not displayed]

FINDINGS: Symmetric carotid arteries. There is poor signal in the mid and
distal right M1 segment with normalized appearance of M2 and distal
vessels. This appearance is likely artifactual related to direction
of flow. No focal narrowing is noted on the source images. No
stenosis or branch occlusion noted in the ACA distribution to
correlate with the acute infarct.

Right dominant vertebrobasilar system. Much of the left vertebral
flows to the PICA. Major posterior circulation vessels are smooth
and widely patent. Mild narrowing along the proximal right PICA.

Negative for aneurysm.
IMPRESSION: Poor signal in the distal right M1 segment is favored artifactual
rather than stenotic. As such, no convincing change compared to
9944. No findings in the ACA distribution to correlate with the
acute infarct.

## 2017-10-04 DIAGNOSIS — N186 End stage renal disease: Secondary | ICD-10-CM | POA: Diagnosis not present

## 2017-10-04 DIAGNOSIS — D631 Anemia in chronic kidney disease: Secondary | ICD-10-CM | POA: Diagnosis not present

## 2017-10-04 DIAGNOSIS — N2581 Secondary hyperparathyroidism of renal origin: Secondary | ICD-10-CM | POA: Diagnosis not present

## 2017-10-06 DIAGNOSIS — N2581 Secondary hyperparathyroidism of renal origin: Secondary | ICD-10-CM | POA: Diagnosis not present

## 2017-10-06 DIAGNOSIS — D631 Anemia in chronic kidney disease: Secondary | ICD-10-CM | POA: Diagnosis not present

## 2017-10-06 DIAGNOSIS — N186 End stage renal disease: Secondary | ICD-10-CM | POA: Diagnosis not present

## 2017-10-08 DIAGNOSIS — D631 Anemia in chronic kidney disease: Secondary | ICD-10-CM | POA: Diagnosis not present

## 2017-10-08 DIAGNOSIS — N2581 Secondary hyperparathyroidism of renal origin: Secondary | ICD-10-CM | POA: Diagnosis not present

## 2017-10-08 DIAGNOSIS — N186 End stage renal disease: Secondary | ICD-10-CM | POA: Diagnosis not present

## 2017-10-11 DIAGNOSIS — N2581 Secondary hyperparathyroidism of renal origin: Secondary | ICD-10-CM | POA: Diagnosis not present

## 2017-10-11 DIAGNOSIS — D631 Anemia in chronic kidney disease: Secondary | ICD-10-CM | POA: Diagnosis not present

## 2017-10-11 DIAGNOSIS — N186 End stage renal disease: Secondary | ICD-10-CM | POA: Diagnosis not present

## 2017-10-13 DIAGNOSIS — N186 End stage renal disease: Secondary | ICD-10-CM | POA: Diagnosis not present

## 2017-10-13 DIAGNOSIS — D631 Anemia in chronic kidney disease: Secondary | ICD-10-CM | POA: Diagnosis not present

## 2017-10-13 DIAGNOSIS — T8612 Kidney transplant failure: Secondary | ICD-10-CM | POA: Diagnosis not present

## 2017-10-13 DIAGNOSIS — Z992 Dependence on renal dialysis: Secondary | ICD-10-CM | POA: Diagnosis not present

## 2017-10-13 DIAGNOSIS — N2581 Secondary hyperparathyroidism of renal origin: Secondary | ICD-10-CM | POA: Diagnosis not present

## 2017-10-15 DIAGNOSIS — Z23 Encounter for immunization: Secondary | ICD-10-CM | POA: Diagnosis not present

## 2017-10-15 DIAGNOSIS — N2581 Secondary hyperparathyroidism of renal origin: Secondary | ICD-10-CM | POA: Diagnosis not present

## 2017-10-15 DIAGNOSIS — N186 End stage renal disease: Secondary | ICD-10-CM | POA: Diagnosis not present

## 2017-10-15 DIAGNOSIS — D631 Anemia in chronic kidney disease: Secondary | ICD-10-CM | POA: Diagnosis not present

## 2017-10-18 DIAGNOSIS — D631 Anemia in chronic kidney disease: Secondary | ICD-10-CM | POA: Diagnosis not present

## 2017-10-18 DIAGNOSIS — Z23 Encounter for immunization: Secondary | ICD-10-CM | POA: Diagnosis not present

## 2017-10-18 DIAGNOSIS — N186 End stage renal disease: Secondary | ICD-10-CM | POA: Diagnosis not present

## 2017-10-18 DIAGNOSIS — N2581 Secondary hyperparathyroidism of renal origin: Secondary | ICD-10-CM | POA: Diagnosis not present

## 2017-10-20 DIAGNOSIS — G43A0 Cyclical vomiting, not intractable: Secondary | ICD-10-CM | POA: Diagnosis not present

## 2017-10-20 DIAGNOSIS — D631 Anemia in chronic kidney disease: Secondary | ICD-10-CM | POA: Diagnosis not present

## 2017-10-20 DIAGNOSIS — N2581 Secondary hyperparathyroidism of renal origin: Secondary | ICD-10-CM | POA: Diagnosis not present

## 2017-10-20 DIAGNOSIS — N186 End stage renal disease: Secondary | ICD-10-CM | POA: Diagnosis not present

## 2017-10-20 DIAGNOSIS — Z23 Encounter for immunization: Secondary | ICD-10-CM | POA: Diagnosis not present

## 2017-10-22 DIAGNOSIS — D631 Anemia in chronic kidney disease: Secondary | ICD-10-CM | POA: Diagnosis not present

## 2017-10-22 DIAGNOSIS — N2581 Secondary hyperparathyroidism of renal origin: Secondary | ICD-10-CM | POA: Diagnosis not present

## 2017-10-22 DIAGNOSIS — Z23 Encounter for immunization: Secondary | ICD-10-CM | POA: Diagnosis not present

## 2017-10-22 DIAGNOSIS — N186 End stage renal disease: Secondary | ICD-10-CM | POA: Diagnosis not present

## 2017-10-25 DIAGNOSIS — N186 End stage renal disease: Secondary | ICD-10-CM | POA: Diagnosis not present

## 2017-10-25 DIAGNOSIS — Z23 Encounter for immunization: Secondary | ICD-10-CM | POA: Diagnosis not present

## 2017-10-25 DIAGNOSIS — D631 Anemia in chronic kidney disease: Secondary | ICD-10-CM | POA: Diagnosis not present

## 2017-10-25 DIAGNOSIS — N2581 Secondary hyperparathyroidism of renal origin: Secondary | ICD-10-CM | POA: Diagnosis not present

## 2017-10-27 DIAGNOSIS — Z23 Encounter for immunization: Secondary | ICD-10-CM | POA: Diagnosis not present

## 2017-10-27 DIAGNOSIS — D631 Anemia in chronic kidney disease: Secondary | ICD-10-CM | POA: Diagnosis not present

## 2017-10-27 DIAGNOSIS — N2581 Secondary hyperparathyroidism of renal origin: Secondary | ICD-10-CM | POA: Diagnosis not present

## 2017-10-27 DIAGNOSIS — N186 End stage renal disease: Secondary | ICD-10-CM | POA: Diagnosis not present

## 2017-10-29 DIAGNOSIS — Z23 Encounter for immunization: Secondary | ICD-10-CM | POA: Diagnosis not present

## 2017-10-29 DIAGNOSIS — N2581 Secondary hyperparathyroidism of renal origin: Secondary | ICD-10-CM | POA: Diagnosis not present

## 2017-10-29 DIAGNOSIS — D631 Anemia in chronic kidney disease: Secondary | ICD-10-CM | POA: Diagnosis not present

## 2017-10-29 DIAGNOSIS — N186 End stage renal disease: Secondary | ICD-10-CM | POA: Diagnosis not present

## 2017-10-31 DIAGNOSIS — N2581 Secondary hyperparathyroidism of renal origin: Secondary | ICD-10-CM | POA: Diagnosis not present

## 2017-10-31 DIAGNOSIS — D631 Anemia in chronic kidney disease: Secondary | ICD-10-CM | POA: Diagnosis not present

## 2017-10-31 DIAGNOSIS — Z23 Encounter for immunization: Secondary | ICD-10-CM | POA: Diagnosis not present

## 2017-10-31 DIAGNOSIS — N186 End stage renal disease: Secondary | ICD-10-CM | POA: Diagnosis not present

## 2017-11-02 DIAGNOSIS — N2581 Secondary hyperparathyroidism of renal origin: Secondary | ICD-10-CM | POA: Diagnosis not present

## 2017-11-02 DIAGNOSIS — Z23 Encounter for immunization: Secondary | ICD-10-CM | POA: Diagnosis not present

## 2017-11-02 DIAGNOSIS — D631 Anemia in chronic kidney disease: Secondary | ICD-10-CM | POA: Diagnosis not present

## 2017-11-02 DIAGNOSIS — N186 End stage renal disease: Secondary | ICD-10-CM | POA: Diagnosis not present

## 2017-11-05 DIAGNOSIS — Z23 Encounter for immunization: Secondary | ICD-10-CM | POA: Diagnosis not present

## 2017-11-05 DIAGNOSIS — N186 End stage renal disease: Secondary | ICD-10-CM | POA: Diagnosis not present

## 2017-11-05 DIAGNOSIS — N2581 Secondary hyperparathyroidism of renal origin: Secondary | ICD-10-CM | POA: Diagnosis not present

## 2017-11-05 DIAGNOSIS — D631 Anemia in chronic kidney disease: Secondary | ICD-10-CM | POA: Diagnosis not present

## 2017-11-08 DIAGNOSIS — N186 End stage renal disease: Secondary | ICD-10-CM | POA: Diagnosis not present

## 2017-11-08 DIAGNOSIS — N2581 Secondary hyperparathyroidism of renal origin: Secondary | ICD-10-CM | POA: Diagnosis not present

## 2017-11-08 DIAGNOSIS — Z23 Encounter for immunization: Secondary | ICD-10-CM | POA: Diagnosis not present

## 2017-11-08 DIAGNOSIS — D631 Anemia in chronic kidney disease: Secondary | ICD-10-CM | POA: Diagnosis not present

## 2017-11-08 NOTE — Progress Notes (Deleted)
[facility]  PATIENT: [name] DOB: [birth year]   REASON FOR VISIT: Hospital follow-up for stroke right ACA small infarct HISTORY FROM: Patient    HISTORY OF PRESENT ILLNESS:Kathryn F Hesteris an 66 y.o.femalewho had dialysis today when patient's sister went to pick her up and noted that she was not acting her usual self. Patient was taken to go to the ED. Patient on arrival stated she was fuzzy headed. She's had TIAs in the past. When asked how she feels she states that she just feels fuzzy headed, she is very lethargic and specifically denies having focal symptoms. She was was to have dialysis yesterday however she missed secondary to the snow. While in CT, patient had no localizing or lateralizing symptoms. The patient answered all questions appropriately. Patient does continue to say that she just does not feel "right in the head." Code stroke was canceled. She was LKE 02/23/2017 at 1100. Patient was not administered IV t-PA secondary to no focal neurologic symptoms. 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 MRA no significant findings. Carotid Doppler unremarkable. 2-D echo 50-55%EF. LDL 101. Hemoglobin A1c 4.6 patient was on aspirin 325 daily and switched to Plavix. She returns to the stroke clinic today for follow-up. She remains on Plavix for secondary stroke prevention without further stroke or TIA symptoms. She has minimal bruising and no bleeding she is also on Zocor for hyperlipidemia without complaints of myalgias. Blood pressure in the office today 160/80. She is a dialysis patient. She had 30 day event monitoring done and just turned in to rule out atrial fibrillation. No results yet. She has stopped her Silver sneakers and was encouraged to reenroll for exercise. She returns for reevaluation  REVIEW OF SYSTEMS: Full 14 system review of systems performed and notable only for those listed, all others are neg:    Constitutional: Fatigue  Cardiovascular: neg Ear/Nose/Throat: neg  Skin: neg Eyes: neg Respiratory: neg Gastroitestinal: neg  Hematology/Lymphatic: neg  Endocrine: neg Musculoskeletal:neg Allergy/Immunology: neg Neurological: neg Psychiatric: neg Sleep : neg   ALLERGIES: Allergies  Allergen Reactions  . Sulfa Antibiotics Other (See Comments)    Both parents allergic-so will not take  . Adhesive [Tape] Itching    HOME MEDICATIONS: Outpatient Medications Prior to Visit  Medication Sig Dispense Refill  . acetaminophen (TYLENOL) 325 MG tablet Take 650 mg by mouth every 6 (six) hours as needed.    Marland Kitchen allopurinol (ZYLOPRIM) 300 MG tablet Take 150 mg by mouth at bedtime.    . ALPRAZolam (XANAX) 0.25 MG tablet Take 0.25 mg by mouth 2 (two) times daily as needed for anxiety.     . B Complex-C-Folic Acid (DIALYVITE PO) Take 1 tablet by mouth daily.    . clopidogrel (PLAVIX) 75 MG tablet Take 1 tablet (75 mg total) by mouth daily. (Patient not taking: Reported on 09/30/2017) 30 tablet 0  . diclofenac sodium (VOLTAREN) 1 % GEL Apply 2 g topically 4 (four) times daily. 100 g 0  . doxazosin (CARDURA) 8 MG tablet Take 1 tablet (8 mg total) by mouth at bedtime. (Patient not taking: Reported on 09/30/2017)    . HYDROcodone-acetaminophen (NORCO/VICODIN) 5-325 MG tablet Take 1-2 tablets by mouth every 6 hours as needed for pain and/or cough. 7 tablet 0  . labetalol (NORMODYNE) 200 MG tablet Take 200 mg by mouth 2 (two) times daily.     Marland Kitchen lactulose (CHRONULAC) 10 GM/15ML solution Take 20 g by mouth daily as needed for mild  constipation.    Marland Kitchen lanthanum (FOSRENOL) 1000 MG chewable tablet Chew 1,000 mg by mouth 2 (two) times daily with a meal.    . losartan (COZAAR) 100 MG tablet Take 100 mg by mouth at bedtime.     . meclizine (ANTIVERT) 25 MG tablet Take 1 tablet (25 mg total) by mouth 2 (two) times daily as needed for dizziness. 30 tablet 0  . SENSIPAR 30 MG tablet Take 30 mg by mouth every  Monday, Wednesday, and Friday with hemodialysis.     Marland Kitchen simvastatin (ZOCOR) 20 MG tablet Take 1 tablet (20 mg total) by mouth at bedtime. (Patient taking differently: Take 10 mg by mouth at bedtime. ) 30 tablet 0  . zolpidem (AMBIEN) 10 MG tablet take 10 mg in the evening     No facility-administered medications prior to visit.     PAST MEDICAL HISTORY: Past Medical History:  Diagnosis Date  . Adenomatous polyp of colon 10/2010, 2006, 2015  . Anemia in CKD (chronic kidney disease) 11/07/2012   s/p blood transfusion.   . Arthritis   . CHF (congestive heart failure) ([place])   . Constipation   . Depression with anxiety   . Diverticula, colon   . 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  Had deceased donor kidney transplant in 1996.  Had some early rejection then stable function for years, then had slow decline of function and went back on hemodialysis in 2012.  Gets HD TTS schedule at [facility] still using L forearm AVF.     Marland Kitchen GERD (gastroesophageal reflux disease)   . Headache   . Hyperlipidemia   . Hypertension   . Neuromuscular disorder (HCC)    neuropathy hand and legs  . Osteoporosis   . Pneumonia   . 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 Stroke [facility]) 11/2015   TIA  . Weight loss, unintentional     PAST SURGICAL HISTORY: Past Surgical History:  Procedure Laterality Date  . AV FISTULA PLACEMENT     for dialysis  . AV FISTULA PLACEMENT Left 11/22/2015   Procedure: ARTERIOVENOUS (AV) FISTULA CREATION-LEFT BRACHIOCEPHALIC;  Surgeon: Serafina Mitchell, MD;  Location: [place];  Service: Vascular;  Laterality: Left;  . BACK SURGERY    . CERVICAL FUSION    . CHOLECYSTECTOMY  12/02/2012   Procedure: LAPAROSCOPIC CHOLECYSTECTOMY WITH INTRAOPERATIVE CHOLANGIOGRAM;  Surgeon: Edward Jolly, MD;  Location: [place];  Service: General;  Laterality: N/A;  . EYE  SURGERY Bilateral    cataract surgery  . HEMATOMA EVACUATION Left 12/24/2016   Procedure: EVACUATION HEMATOMA LEFT UPPER ARM;  Surgeon: Waynetta Sandy, MD;  Location: [place];  Service: Vascular;  Laterality: Left;  . I&D EXTREMITY Left 12/31/2016   Procedure: IRRIGATION AND DEBRIDEMENT EXTREMITY;  Surgeon: Angelia Mould, MD;  Location: [place];  Service: Vascular;  Laterality: Left;  . INSERTION OF DIALYSIS CATHETER Right 12/24/2016   Procedure: INSERTION OF DIALYSIS CATHETER;  Surgeon: Waynetta Sandy, MD;  Location: [place];  Service: Vascular;  Laterality: Right;  . INSERTION OF DIALYSIS CATHETER Right 02/04/2017   Procedure: INSERTION OF DIALYSIS CATHETER;  Surgeon: Waynetta Sandy, MD;  Location: [place];  Service: Vascular;  Laterality: Right;  . KIDNEY TRANSPLANT  1996  . PERIPHERAL VASCULAR CATHETERIZATION Left 10/23/2016   Procedure: Fistulagram;  Surgeon: Elam Dutch, MD;  Location: [place] [facility];  Service: Cardiovascular;  Laterality: Left;  . RESECTION OF ARTERIOVENOUS FISTULA ANEURYSM Left 11/22/2015   Procedure: RESECTION OF LEFT RADIOCEPHALIC FISTULA ANEURYSM ;  Surgeon: Serafina Mitchell, MD;  Location: [place];  Service: Vascular;  Laterality: Left;  . REVISON OF ARTERIOVENOUS FISTULA Left 12/22/2016   Procedure: REVISON OF LEFT ARTERIOVENOUS FISTULA;  Surgeon: Waynetta Sandy, MD;  Location: [place];  Service: Vascular;  Laterality: Left;  . REVISON OF ARTERIOVENOUS FISTULA Left 02/04/2017   Procedure: REVISON OF LEFT UPPER ARM ARTERIOVENOUS FISTULA;  Surgeon: Waynetta Sandy, MD;  Location: [place];  Service: Vascular;  Laterality: Left;    FAMILY HISTORY: Family History  Problem Relation Age of Onset  . Colon cancer Brother   . Cancer Brother   . Coronary artery disease Mother 72  . Hyperlipidemia Mother   . Hypertension Mother   . Stroke Maternal Aunt   . Esophageal cancer Neg Hx   . Stomach cancer Neg Hx   . Rectal cancer  Neg Hx     SOCIAL HISTORY: Social History   Socioeconomic History  . Marital status: Widowed    Spouse name: Not on file  . Number of children: 2  . Years of education: Not on file  . Highest education level: Not on file  Social Needs  . Financial resource strain: Not on file  . Food insecurity - worry: Not on file  . Food insecurity - inability: Not on file  . Transportation needs - medical: Not on file  . Transportation needs - non-medical: Not on file  Occupational History  . Not on file  Tobacco Use  . Smoking status: Former Smoker    Types: Cigarettes    Last attempt to quit: 12/31/1991    Years since quitting: 25.8  . Smokeless tobacco: Never Used  Substance and Sexual Activity  . Alcohol use: No    Alcohol/week: 0.0 oz  . Drug use: No  . Sexual activity: No  Other Topics Concern  . Not on file  Social History Narrative  . Not on file     PHYSICAL EXAM  There were no vitals filed for this visit. There is no height or weight on file to calculate BMI.  Generalized: Well developed, Frail appearing female in no acute distress  Head: normocephalic and atraumatic,. Oropharynx benign  Neck: Supple, no carotid bruits  Cardiac: Regular rate rhythm, no murmur  Musculoskeletal: No deformity   Neurological examination   Mentation: Alert oriented to time, place, history taking. Attention span and concentration appropriate. Recent and remote memory intact.  Follows all commands speech and language fluent.   Cranial nerve II-XII: Fundoscopic exam not done.Pupils were equal round reactive to light extraocular movements were full, visual field were full on confrontational test. Facial sensation and strength were normal. hearing was intact to finger rubbing bilaterally. Uvula tongue midline. head turning and shoulder shrug were normal and symmetric.Tongue protrusion into cheek strength was normal. Motor: normal bulk and tone, full strength in the BUE, BLE, fine finger  movements normal, no pronator drift. No focal weakness Sensory: normal and symmetric to light touch, pinprick, and  Vibration, in the upper and lower extremities  Coordination: finger-nose-finger, heel-to-shin bilaterally, no dysmetria, no tremor Reflexes: Symmetric upper and lower plantar responses were flexor bilaterally. Gait and Station: Rising up from seated position without assistance, normal stance,  moderate stride, good arm swing, smooth turning, able to perform tiptoe, and heel walking without difficulty. Tandem gait  is mildly unsteady  DIAGNOSTIC DATA (LABS, IMAGING, TESTING) - I reviewed patient records, labs, notes, testing and imaging myself where available.  Lab Results  Component Value Date   WBC 7.5 09/30/2017   HGB 11.4 (L) 09/30/2017   HCT 35.7 (L) 09/30/2017   MCV 95.7 09/30/2017   PLT 212 09/30/2017      Component Value Date/Time   NA 137 09/30/2017 1557   K 4.4 09/30/2017 1557   CL 90 (L) 09/30/2017 1557   CO2 32 09/30/2017 1557   GLUCOSE 96 09/30/2017 1557   BUN 26 (H) 09/30/2017 1557   CREATININE 5.83 (H) 09/30/2017 1557   CALCIUM 8.8 (L) 09/30/2017 1557   CALCIUM 9.6 08/03/2011 1005   PROT 7.0 05/12/2017 0912   ALBUMIN 3.3 (L) 05/12/2017 0912   AST 25 05/12/2017 0912   ALT 10 (L) 05/12/2017 0912   ALKPHOS 74 05/12/2017 0912   BILITOT 0.9 05/12/2017 0912   GFRNONAA 7 (L) 09/30/2017 1557   GFRAA 8 (L) 09/30/2017 1557   Lab Results  Component Value Date   CHOL 171 02/24/2017   HDL 52 02/24/2017   LDLCALC 101 (H) 02/24/2017   TRIG 92 02/24/2017   CHOLHDL 3.3 02/24/2017   Lab Results  Component Value Date   HGBA1C 4.6 (L) 02/24/2017    Lab Results  Component Value Date   TSH 2.828 02/24/2017      ASSESSMENT AND PLAN  66 y.o. year old female  has a past medical history of  Anemia in CKD (chronic kidney disease) (11/07/2012); Arthritis; CHF (congestive heart failure) ([place]);  Depression with anxiety;  (end stage renal disease) ([place])  (11/07/2012);  Headache; Hyperlipidemia; Hypertension; Neuromuscular disorder ([place]); Stroke [facility]) (11/2015); and Weight loss, unintentional. Here for hospital  follow-up for right ACA small infarct.The patient is a current patient of Dr. Erlinda Hong  who is out of the office today . This note is sent to the work in doctor.     PLAN;Stressed the importance of management of risk factors to prevent further stroke Continue Plavix for secondary stroke prevention Maintain strict control of hypertension with blood pressure goal below 130/90, today's reading 160/80 continue antihypertensive medications Control of diabetes with hemoglobin A1c below 6.5 followed by primary care most recent hemoglobin A1c 4.6  continue diabetic medications Cholesterol with LDL cholesterol less than 70, followed by primary care,   continue statin drugs Exercise by walking, encouraged to get back into Silver sneakers slowly increase , eat healthy diet with whole grains,  fresh fruits and vegetables no salt Follow up in 6 months Discussed risk for recurrent stroke/ TIA and answered additional questions This was a visit requiring 30  minutes and medical decision making of high complexity with extensive review of history, hospital chart, counseling and answering questions Dennie Bible, [facility], [facility] APRN  [facility] [address], [place], [place] [address] [phone]

## 2017-11-09 ENCOUNTER — Ambulatory Visit: Payer: Medicare Other | Admitting: Nurse Practitioner

## 2017-11-09 ENCOUNTER — Telehealth: Payer: Self-pay | Admitting: *Deleted

## 2017-11-09 NOTE — Telephone Encounter (Signed)
Patient was no show for FU with NP today. 

## 2017-11-10 ENCOUNTER — Encounter: Payer: Self-pay | Admitting: Nurse Practitioner

## 2017-11-10 DIAGNOSIS — D631 Anemia in chronic kidney disease: Secondary | ICD-10-CM | POA: Diagnosis not present

## 2017-11-10 DIAGNOSIS — Z23 Encounter for immunization: Secondary | ICD-10-CM | POA: Diagnosis not present

## 2017-11-10 DIAGNOSIS — N2581 Secondary hyperparathyroidism of renal origin: Secondary | ICD-10-CM | POA: Diagnosis not present

## 2017-11-10 DIAGNOSIS — N186 End stage renal disease: Secondary | ICD-10-CM | POA: Diagnosis not present

## 2017-11-12 DIAGNOSIS — Z992 Dependence on renal dialysis: Secondary | ICD-10-CM | POA: Diagnosis not present

## 2017-11-12 DIAGNOSIS — T8612 Kidney transplant failure: Secondary | ICD-10-CM | POA: Diagnosis not present

## 2017-11-12 DIAGNOSIS — D631 Anemia in chronic kidney disease: Secondary | ICD-10-CM | POA: Diagnosis not present

## 2017-11-12 DIAGNOSIS — N186 End stage renal disease: Secondary | ICD-10-CM | POA: Diagnosis not present

## 2017-11-12 DIAGNOSIS — N2581 Secondary hyperparathyroidism of renal origin: Secondary | ICD-10-CM | POA: Diagnosis not present

## 2017-11-12 DIAGNOSIS — Z23 Encounter for immunization: Secondary | ICD-10-CM | POA: Diagnosis not present

## 2017-11-15 DIAGNOSIS — N2581 Secondary hyperparathyroidism of renal origin: Secondary | ICD-10-CM | POA: Diagnosis not present

## 2017-11-15 DIAGNOSIS — D631 Anemia in chronic kidney disease: Secondary | ICD-10-CM | POA: Diagnosis not present

## 2017-11-15 DIAGNOSIS — N186 End stage renal disease: Secondary | ICD-10-CM | POA: Diagnosis not present

## 2017-11-16 DIAGNOSIS — R413 Other amnesia: Secondary | ICD-10-CM | POA: Diagnosis not present

## 2017-11-16 DIAGNOSIS — E7800 Pure hypercholesterolemia, unspecified: Secondary | ICD-10-CM | POA: Diagnosis not present

## 2017-11-16 DIAGNOSIS — Z1389 Encounter for screening for other disorder: Secondary | ICD-10-CM | POA: Diagnosis not present

## 2017-11-16 DIAGNOSIS — N186 End stage renal disease: Secondary | ICD-10-CM | POA: Diagnosis not present

## 2017-11-16 DIAGNOSIS — Z0000 Encounter for general adult medical examination without abnormal findings: Secondary | ICD-10-CM | POA: Diagnosis not present

## 2017-11-16 DIAGNOSIS — M8580 Other specified disorders of bone density and structure, unspecified site: Secondary | ICD-10-CM | POA: Diagnosis not present

## 2017-11-16 DIAGNOSIS — I639 Cerebral infarction, unspecified: Secondary | ICD-10-CM | POA: Diagnosis not present

## 2017-11-17 DIAGNOSIS — D631 Anemia in chronic kidney disease: Secondary | ICD-10-CM | POA: Diagnosis not present

## 2017-11-17 DIAGNOSIS — N186 End stage renal disease: Secondary | ICD-10-CM | POA: Diagnosis not present

## 2017-11-17 DIAGNOSIS — N2581 Secondary hyperparathyroidism of renal origin: Secondary | ICD-10-CM | POA: Diagnosis not present

## 2017-11-19 DIAGNOSIS — N186 End stage renal disease: Secondary | ICD-10-CM | POA: Diagnosis not present

## 2017-11-19 DIAGNOSIS — N2581 Secondary hyperparathyroidism of renal origin: Secondary | ICD-10-CM | POA: Diagnosis not present

## 2017-11-19 DIAGNOSIS — D631 Anemia in chronic kidney disease: Secondary | ICD-10-CM | POA: Diagnosis not present

## 2017-11-24 DIAGNOSIS — N2581 Secondary hyperparathyroidism of renal origin: Secondary | ICD-10-CM | POA: Diagnosis not present

## 2017-11-24 DIAGNOSIS — D631 Anemia in chronic kidney disease: Secondary | ICD-10-CM | POA: Diagnosis not present

## 2017-11-24 DIAGNOSIS — N186 End stage renal disease: Secondary | ICD-10-CM | POA: Diagnosis not present

## 2017-11-26 DIAGNOSIS — D631 Anemia in chronic kidney disease: Secondary | ICD-10-CM | POA: Diagnosis not present

## 2017-11-26 DIAGNOSIS — N2581 Secondary hyperparathyroidism of renal origin: Secondary | ICD-10-CM | POA: Diagnosis not present

## 2017-11-26 DIAGNOSIS — N186 End stage renal disease: Secondary | ICD-10-CM | POA: Diagnosis not present

## 2017-11-29 DIAGNOSIS — D631 Anemia in chronic kidney disease: Secondary | ICD-10-CM | POA: Diagnosis not present

## 2017-11-29 DIAGNOSIS — N186 End stage renal disease: Secondary | ICD-10-CM | POA: Diagnosis not present

## 2017-11-29 DIAGNOSIS — N2581 Secondary hyperparathyroidism of renal origin: Secondary | ICD-10-CM | POA: Diagnosis not present

## 2017-12-01 DIAGNOSIS — N2581 Secondary hyperparathyroidism of renal origin: Secondary | ICD-10-CM | POA: Diagnosis not present

## 2017-12-01 DIAGNOSIS — N186 End stage renal disease: Secondary | ICD-10-CM | POA: Diagnosis not present

## 2017-12-01 DIAGNOSIS — D631 Anemia in chronic kidney disease: Secondary | ICD-10-CM | POA: Diagnosis not present

## 2017-12-03 DIAGNOSIS — N2581 Secondary hyperparathyroidism of renal origin: Secondary | ICD-10-CM | POA: Diagnosis not present

## 2017-12-03 DIAGNOSIS — D631 Anemia in chronic kidney disease: Secondary | ICD-10-CM | POA: Diagnosis not present

## 2017-12-03 DIAGNOSIS — N186 End stage renal disease: Secondary | ICD-10-CM | POA: Diagnosis not present

## 2017-12-05 DIAGNOSIS — N2581 Secondary hyperparathyroidism of renal origin: Secondary | ICD-10-CM | POA: Diagnosis not present

## 2017-12-05 DIAGNOSIS — D631 Anemia in chronic kidney disease: Secondary | ICD-10-CM | POA: Diagnosis not present

## 2017-12-05 DIAGNOSIS — N186 End stage renal disease: Secondary | ICD-10-CM | POA: Diagnosis not present

## 2017-12-08 DIAGNOSIS — N2581 Secondary hyperparathyroidism of renal origin: Secondary | ICD-10-CM | POA: Diagnosis not present

## 2017-12-08 DIAGNOSIS — N186 End stage renal disease: Secondary | ICD-10-CM | POA: Diagnosis not present

## 2017-12-08 DIAGNOSIS — D631 Anemia in chronic kidney disease: Secondary | ICD-10-CM | POA: Diagnosis not present

## 2017-12-10 DIAGNOSIS — N186 End stage renal disease: Secondary | ICD-10-CM | POA: Diagnosis not present

## 2017-12-10 DIAGNOSIS — D631 Anemia in chronic kidney disease: Secondary | ICD-10-CM | POA: Diagnosis not present

## 2017-12-10 DIAGNOSIS — N2581 Secondary hyperparathyroidism of renal origin: Secondary | ICD-10-CM | POA: Diagnosis not present

## 2017-12-12 DIAGNOSIS — D631 Anemia in chronic kidney disease: Secondary | ICD-10-CM | POA: Diagnosis not present

## 2017-12-12 DIAGNOSIS — N186 End stage renal disease: Secondary | ICD-10-CM | POA: Diagnosis not present

## 2017-12-12 DIAGNOSIS — N2581 Secondary hyperparathyroidism of renal origin: Secondary | ICD-10-CM | POA: Diagnosis not present

## 2017-12-13 DIAGNOSIS — Z992 Dependence on renal dialysis: Secondary | ICD-10-CM | POA: Diagnosis not present

## 2017-12-13 DIAGNOSIS — T8612 Kidney transplant failure: Secondary | ICD-10-CM | POA: Diagnosis not present

## 2017-12-13 DIAGNOSIS — N186 End stage renal disease: Secondary | ICD-10-CM | POA: Diagnosis not present

## 2017-12-14 HISTORY — PX: EYE SURGERY: SHX253

## 2017-12-15 DIAGNOSIS — N2581 Secondary hyperparathyroidism of renal origin: Secondary | ICD-10-CM | POA: Diagnosis not present

## 2017-12-15 DIAGNOSIS — D631 Anemia in chronic kidney disease: Secondary | ICD-10-CM | POA: Diagnosis not present

## 2017-12-15 DIAGNOSIS — N186 End stage renal disease: Secondary | ICD-10-CM | POA: Diagnosis not present

## 2017-12-17 DIAGNOSIS — N2581 Secondary hyperparathyroidism of renal origin: Secondary | ICD-10-CM | POA: Diagnosis not present

## 2017-12-17 DIAGNOSIS — N186 End stage renal disease: Secondary | ICD-10-CM | POA: Diagnosis not present

## 2017-12-17 DIAGNOSIS — D631 Anemia in chronic kidney disease: Secondary | ICD-10-CM | POA: Diagnosis not present

## 2017-12-19 IMAGING — DX DG CHEST 2V
2 series · 2 of 2 positions shown · non-contrast
Comparison: CT chest 07/31/2014.  PA and lateral chest 02/23/2017.

CLINICAL DATA: Worsening shortness of breath beginning this
morning. Patient due for dialysis today.

EXAM:
CHEST  2 VIEW

[chest pa]
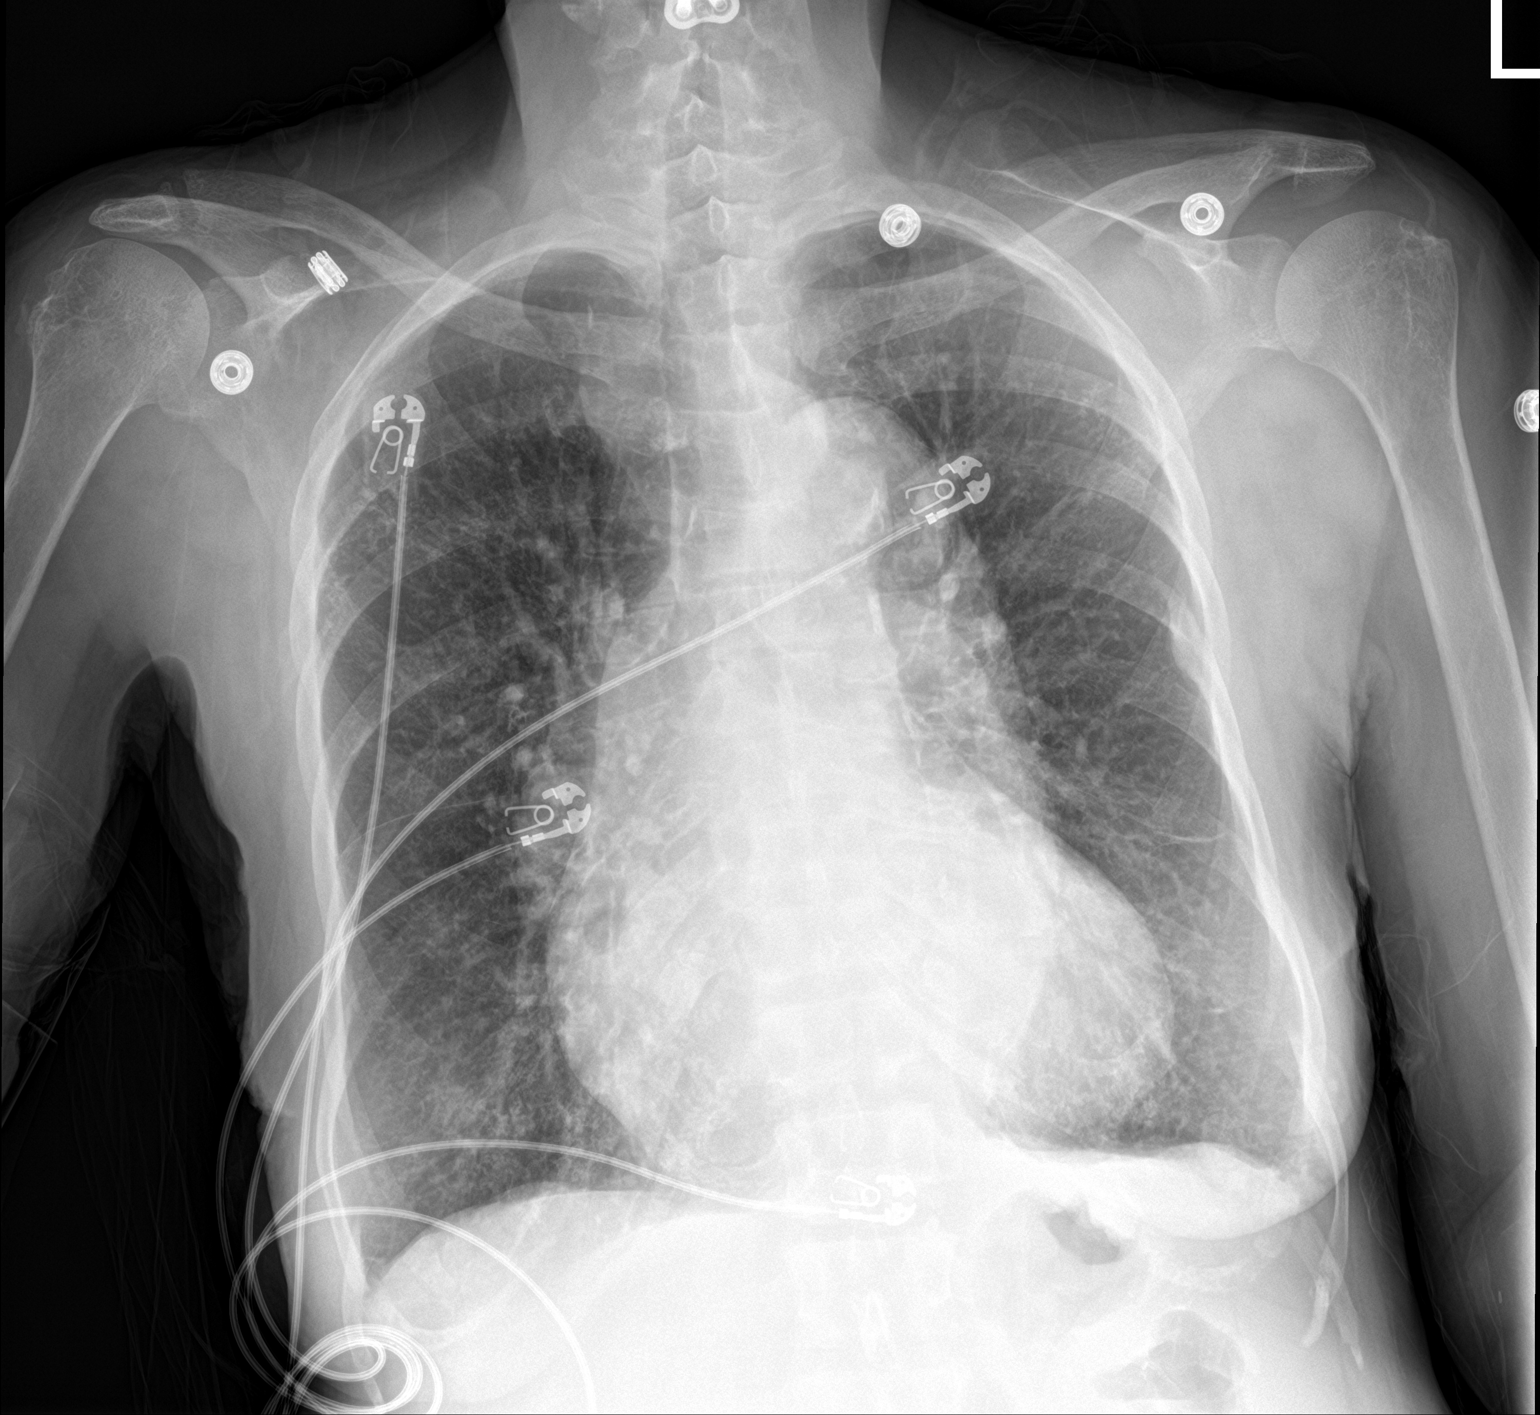

[chest lat]
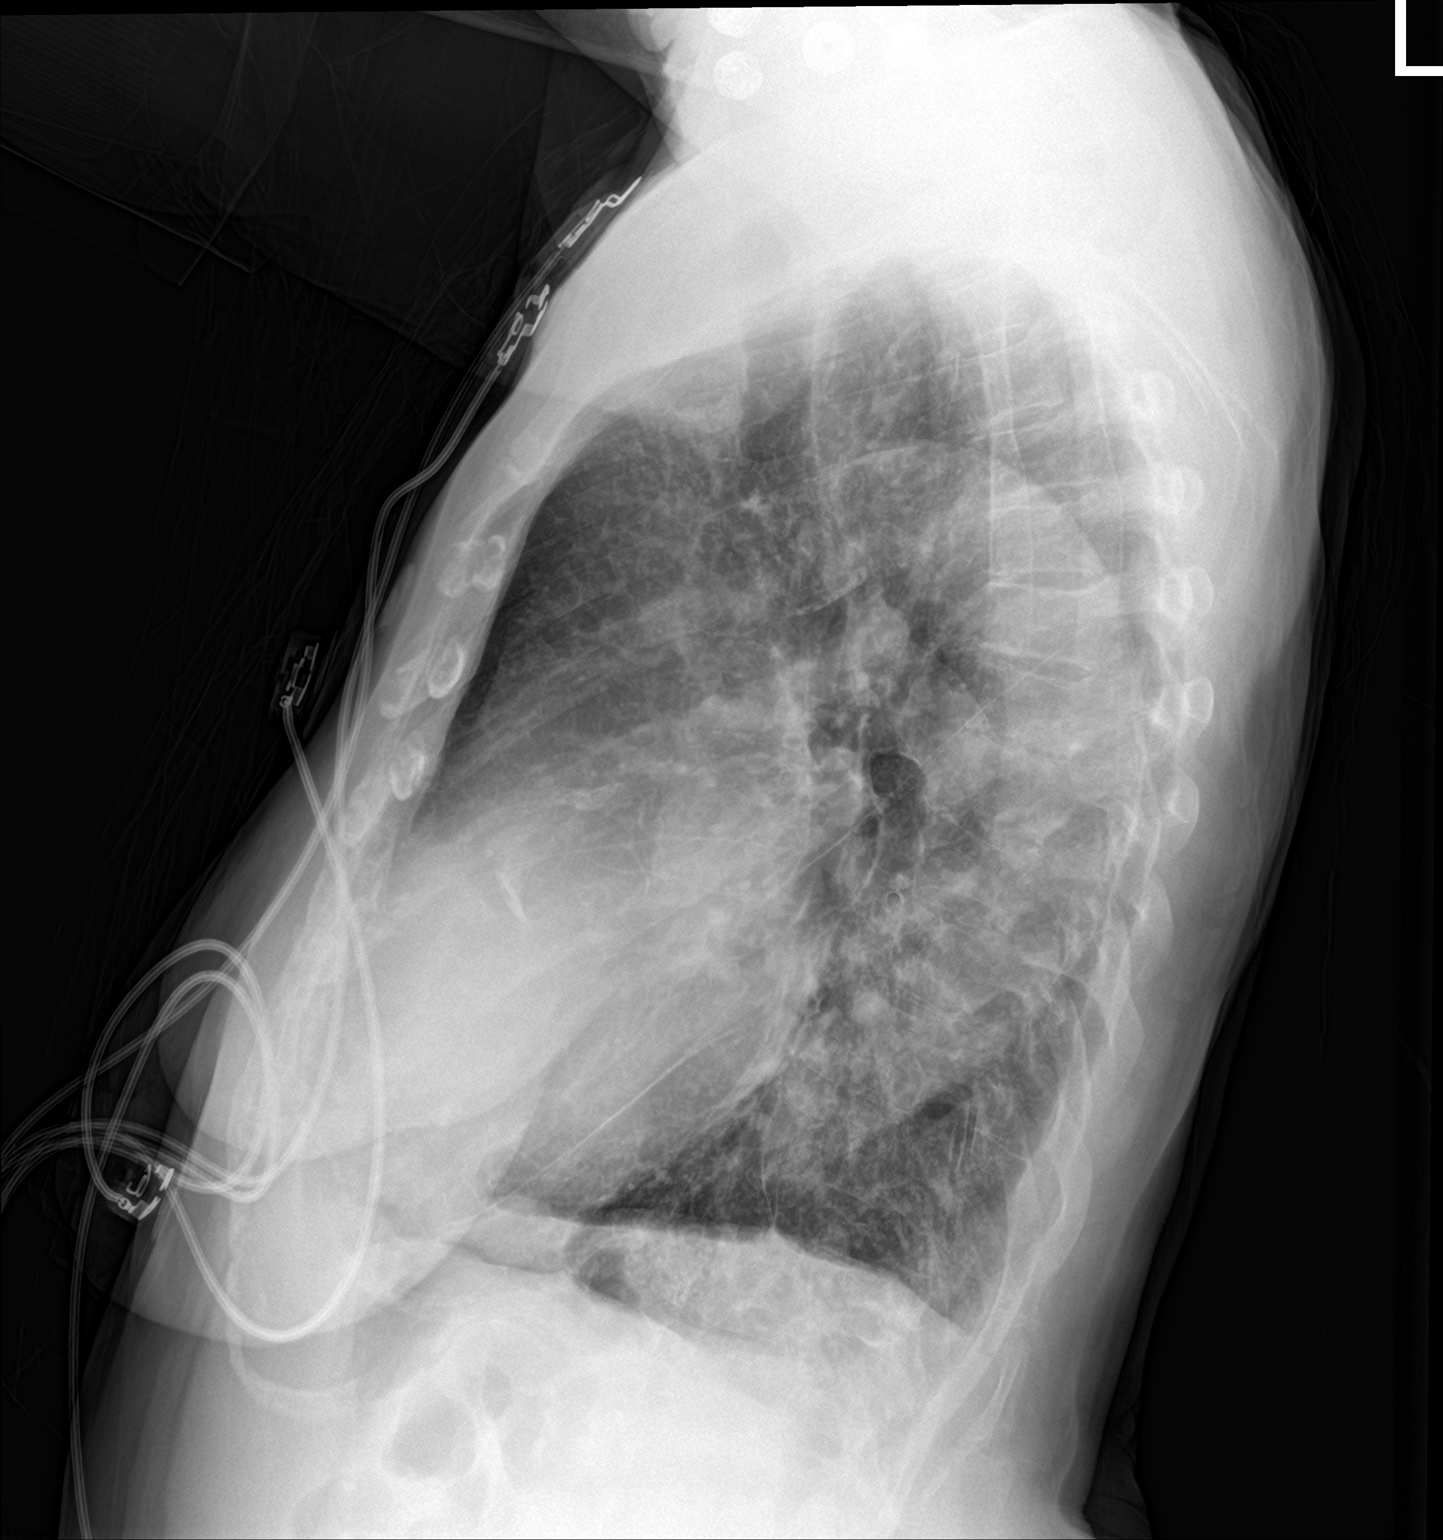

[2 of 2 positions shown; findings below may reference images not displayed]

FINDINGS: There is marked cardiomegaly without edema. Lumbar the lungs are
clear. No pneumothorax or pleural effusion. Aortic atherosclerosis
noted.
IMPRESSION: Cardiomegaly without acute disease.

Atherosclerosis.

## 2017-12-19 IMAGING — CT CT HEAD W/O CM
3 series · 14 of 46 positions shown, 16 images · non-contrast
Comparison: Head CT and MRI 02/23/2017

CLINICAL DATA: Altered mental status. History of intracranial
hemorrhage. On Plavix.

EXAM:
CT HEAD WITHOUT CONTRAST
TECHNIQUE: Contiguous axial images were obtained from the base of the skull
through the vertex without intravenous contrast.

[Series 3: head 5.0 h30s · axial · 0.45mm/px · z∈[-130,-10]mm · 8 of 29 slices shown, 10 images]
[im 3/29  brain]
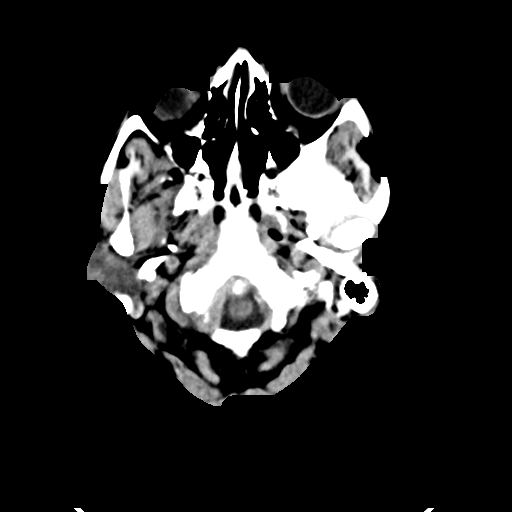
[im 3/29  bone]
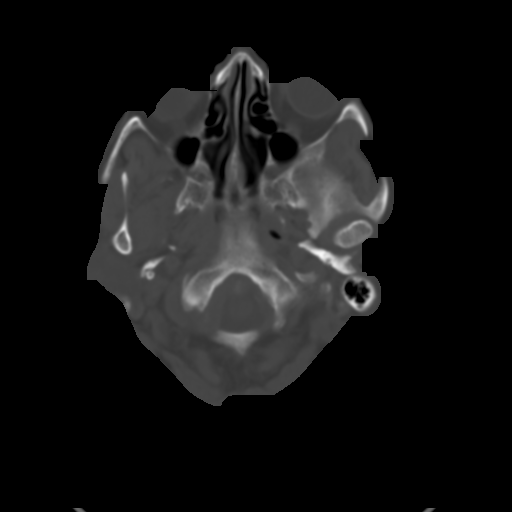
[im 7/29  brain]
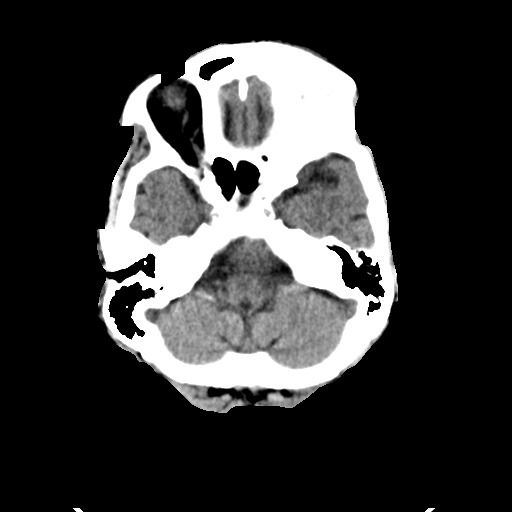
[im 10/29  brain]
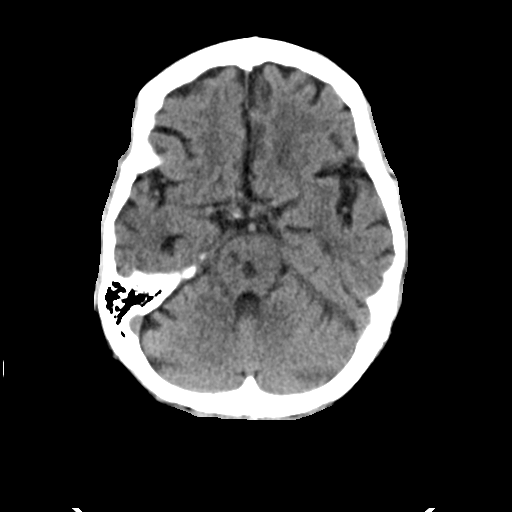
[im 13/29  brain]
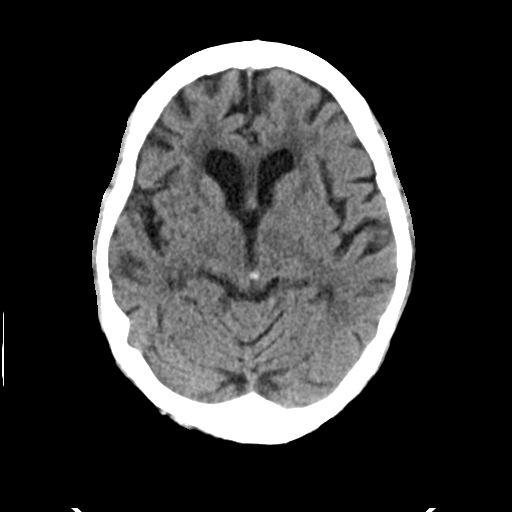
[im 17/29  brain]
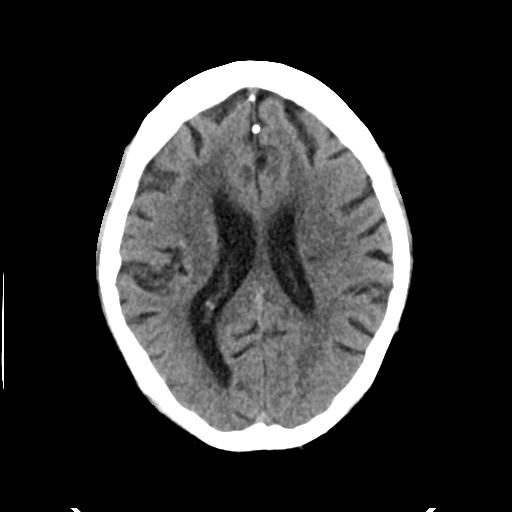
[im 17/29  bone]
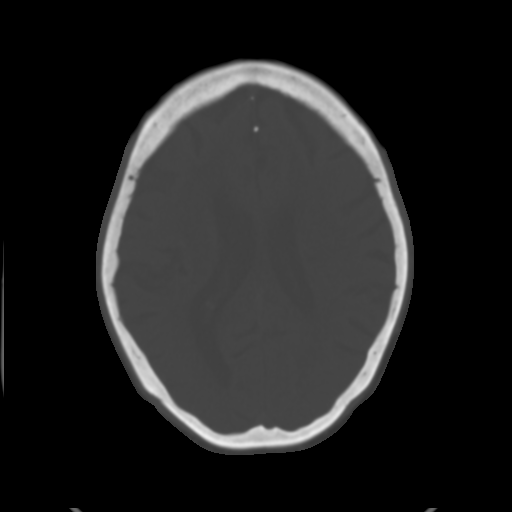
[im 20/29  brain]
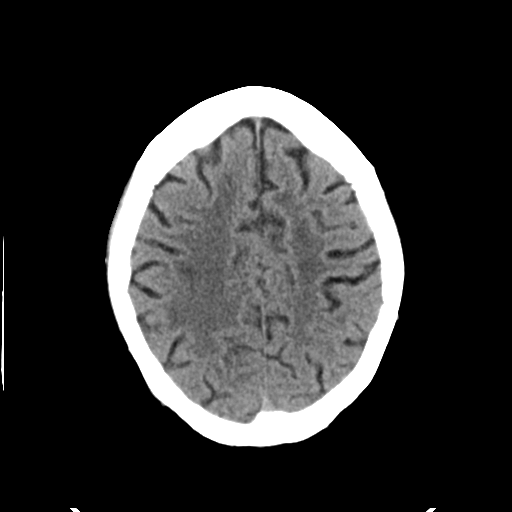
[im 23/29  brain]
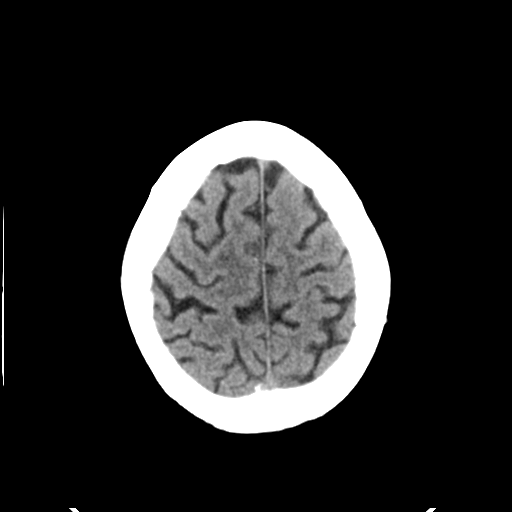
[im 27/29  brain]
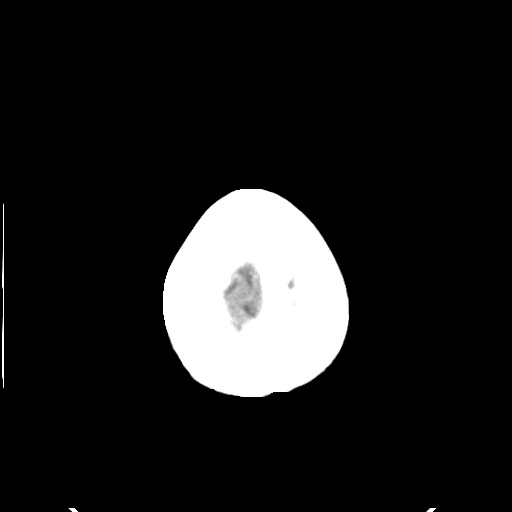

[Series 5: head 3.0 mpr cor · coronal · 0.30mm/px · 3 of 67 slices shown]
[im 23/67  brain]
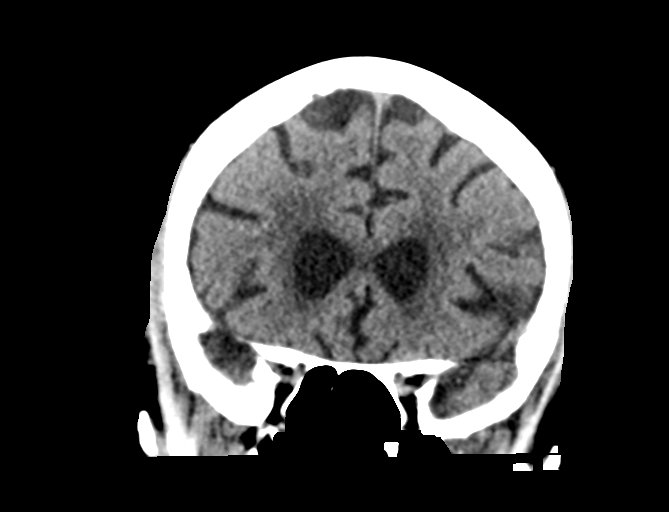
[im 30/67  brain]
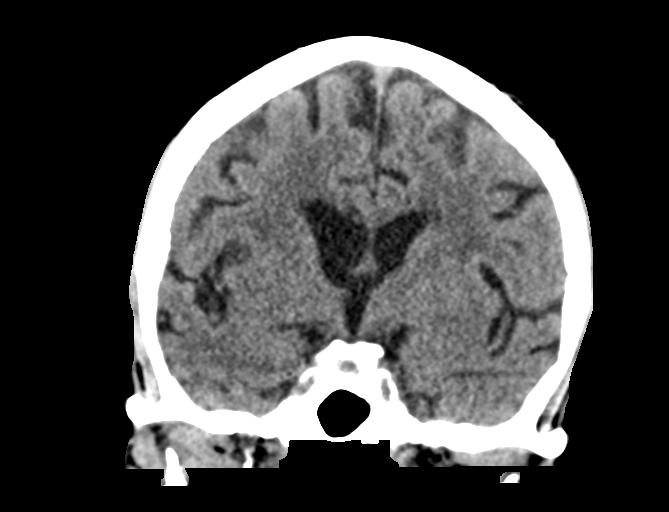
[im 37/67  brain]
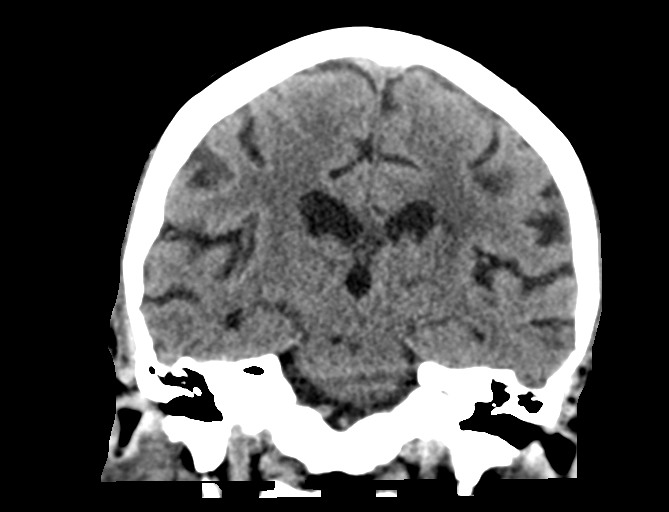

[Series 6: head 3.0 mpr sag · sagittal · 0.29mm/px · 3 of 67 slices shown]
[im 23/67  brain]
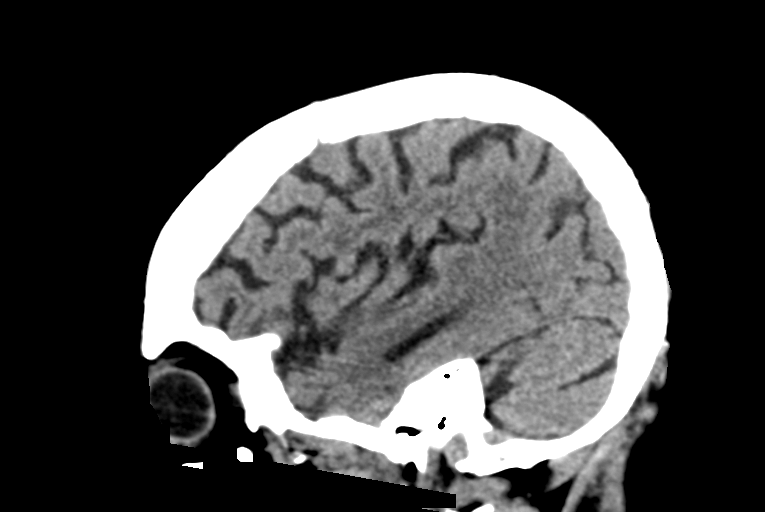
[im 34/67  brain]
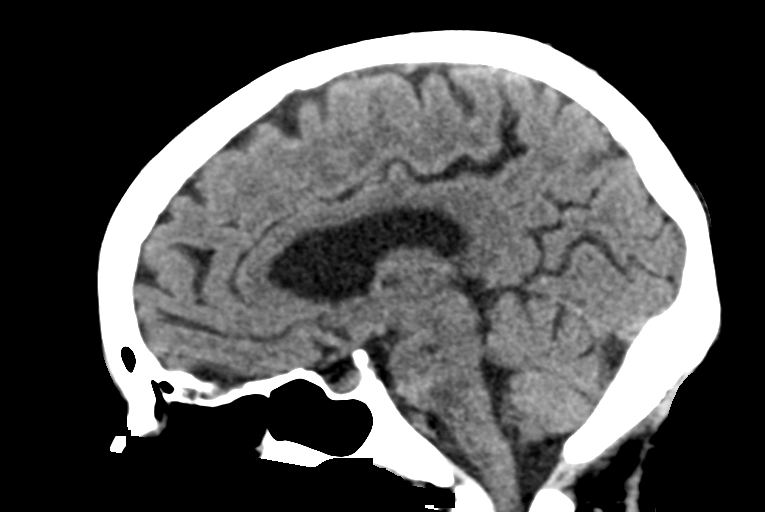
[im 45/67  brain]
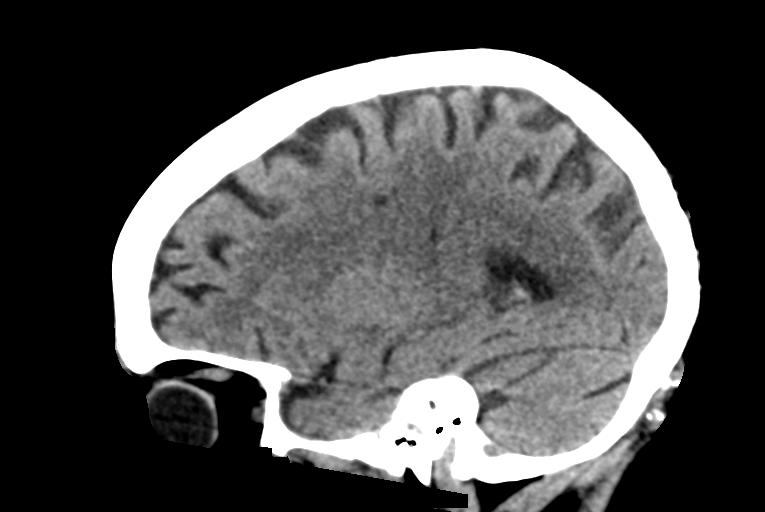

[14 of 46 positions shown; findings below may reference images not displayed]

FINDINGS: Brain: There is no evidence of acute infarct, intracranial
hemorrhage, mass, midline shift, or extra-axial fluid collection.
There is mild cerebral atrophy. Advanced chronic small vessel
ischemic disease is again seen with chronic lacunar infarcts
involving the cerebral white matter and thalami bilaterally. Chronic
pontine infarcts are also noted.

Vascular: Calcified atherosclerosis at the skullbase. No hyperdense
vessel.

Skull: No fracture or focal osseous lesion.

Sinuses/Orbits: Visualized paranasal sinuses and mastoid air cells
are clear. Prior bilateral cataract extraction. Partially visualized
degenerative changes about the dens.

Other: None.
IMPRESSION: 1. No evidence of acute intracranial abnormality.
2. Advanced chronic small vessel ischemic disease.

## 2017-12-20 DIAGNOSIS — N2581 Secondary hyperparathyroidism of renal origin: Secondary | ICD-10-CM | POA: Diagnosis not present

## 2017-12-20 DIAGNOSIS — D631 Anemia in chronic kidney disease: Secondary | ICD-10-CM | POA: Diagnosis not present

## 2017-12-20 DIAGNOSIS — N186 End stage renal disease: Secondary | ICD-10-CM | POA: Diagnosis not present

## 2017-12-22 DIAGNOSIS — N2581 Secondary hyperparathyroidism of renal origin: Secondary | ICD-10-CM | POA: Diagnosis not present

## 2017-12-22 DIAGNOSIS — D631 Anemia in chronic kidney disease: Secondary | ICD-10-CM | POA: Diagnosis not present

## 2017-12-22 DIAGNOSIS — N186 End stage renal disease: Secondary | ICD-10-CM | POA: Diagnosis not present

## 2017-12-24 DIAGNOSIS — N2581 Secondary hyperparathyroidism of renal origin: Secondary | ICD-10-CM | POA: Diagnosis not present

## 2017-12-24 DIAGNOSIS — D631 Anemia in chronic kidney disease: Secondary | ICD-10-CM | POA: Diagnosis not present

## 2017-12-24 DIAGNOSIS — N186 End stage renal disease: Secondary | ICD-10-CM | POA: Diagnosis not present

## 2017-12-27 DIAGNOSIS — N186 End stage renal disease: Secondary | ICD-10-CM | POA: Diagnosis not present

## 2017-12-27 DIAGNOSIS — N2581 Secondary hyperparathyroidism of renal origin: Secondary | ICD-10-CM | POA: Diagnosis not present

## 2017-12-27 DIAGNOSIS — D631 Anemia in chronic kidney disease: Secondary | ICD-10-CM | POA: Diagnosis not present

## 2017-12-29 DIAGNOSIS — N186 End stage renal disease: Secondary | ICD-10-CM | POA: Diagnosis not present

## 2017-12-29 DIAGNOSIS — D631 Anemia in chronic kidney disease: Secondary | ICD-10-CM | POA: Diagnosis not present

## 2017-12-29 DIAGNOSIS — N2581 Secondary hyperparathyroidism of renal origin: Secondary | ICD-10-CM | POA: Diagnosis not present

## 2017-12-31 DIAGNOSIS — D631 Anemia in chronic kidney disease: Secondary | ICD-10-CM | POA: Diagnosis not present

## 2017-12-31 DIAGNOSIS — N2581 Secondary hyperparathyroidism of renal origin: Secondary | ICD-10-CM | POA: Diagnosis not present

## 2017-12-31 DIAGNOSIS — N186 End stage renal disease: Secondary | ICD-10-CM | POA: Diagnosis not present

## 2018-01-03 DIAGNOSIS — N186 End stage renal disease: Secondary | ICD-10-CM | POA: Diagnosis not present

## 2018-01-03 DIAGNOSIS — N2581 Secondary hyperparathyroidism of renal origin: Secondary | ICD-10-CM | POA: Diagnosis not present

## 2018-01-03 DIAGNOSIS — D631 Anemia in chronic kidney disease: Secondary | ICD-10-CM | POA: Diagnosis not present

## 2018-01-05 DIAGNOSIS — D631 Anemia in chronic kidney disease: Secondary | ICD-10-CM | POA: Diagnosis not present

## 2018-01-05 DIAGNOSIS — N2581 Secondary hyperparathyroidism of renal origin: Secondary | ICD-10-CM | POA: Diagnosis not present

## 2018-01-05 DIAGNOSIS — N186 End stage renal disease: Secondary | ICD-10-CM | POA: Diagnosis not present

## 2018-01-07 DIAGNOSIS — N186 End stage renal disease: Secondary | ICD-10-CM | POA: Diagnosis not present

## 2018-01-07 DIAGNOSIS — N2581 Secondary hyperparathyroidism of renal origin: Secondary | ICD-10-CM | POA: Diagnosis not present

## 2018-01-07 DIAGNOSIS — D631 Anemia in chronic kidney disease: Secondary | ICD-10-CM | POA: Diagnosis not present

## 2018-01-10 DIAGNOSIS — N186 End stage renal disease: Secondary | ICD-10-CM | POA: Diagnosis not present

## 2018-01-10 DIAGNOSIS — N2581 Secondary hyperparathyroidism of renal origin: Secondary | ICD-10-CM | POA: Diagnosis not present

## 2018-01-10 DIAGNOSIS — D631 Anemia in chronic kidney disease: Secondary | ICD-10-CM | POA: Diagnosis not present

## 2018-01-12 DIAGNOSIS — N186 End stage renal disease: Secondary | ICD-10-CM | POA: Diagnosis not present

## 2018-01-12 DIAGNOSIS — D631 Anemia in chronic kidney disease: Secondary | ICD-10-CM | POA: Diagnosis not present

## 2018-01-12 DIAGNOSIS — N2581 Secondary hyperparathyroidism of renal origin: Secondary | ICD-10-CM | POA: Diagnosis not present

## 2018-01-13 DIAGNOSIS — Z992 Dependence on renal dialysis: Secondary | ICD-10-CM | POA: Diagnosis not present

## 2018-01-13 DIAGNOSIS — N186 End stage renal disease: Secondary | ICD-10-CM | POA: Diagnosis not present

## 2018-01-13 DIAGNOSIS — T8612 Kidney transplant failure: Secondary | ICD-10-CM | POA: Diagnosis not present

## 2018-01-14 DIAGNOSIS — N186 End stage renal disease: Secondary | ICD-10-CM | POA: Diagnosis not present

## 2018-01-14 DIAGNOSIS — T8612 Kidney transplant failure: Secondary | ICD-10-CM | POA: Diagnosis not present

## 2018-01-14 DIAGNOSIS — Z992 Dependence on renal dialysis: Secondary | ICD-10-CM | POA: Diagnosis not present

## 2018-01-14 DIAGNOSIS — N2581 Secondary hyperparathyroidism of renal origin: Secondary | ICD-10-CM | POA: Diagnosis not present

## 2018-01-14 DIAGNOSIS — D509 Iron deficiency anemia, unspecified: Secondary | ICD-10-CM | POA: Diagnosis not present

## 2018-01-14 DIAGNOSIS — D631 Anemia in chronic kidney disease: Secondary | ICD-10-CM | POA: Diagnosis not present

## 2018-01-17 DIAGNOSIS — D509 Iron deficiency anemia, unspecified: Secondary | ICD-10-CM | POA: Diagnosis not present

## 2018-01-17 DIAGNOSIS — N2581 Secondary hyperparathyroidism of renal origin: Secondary | ICD-10-CM | POA: Diagnosis not present

## 2018-01-17 DIAGNOSIS — N186 End stage renal disease: Secondary | ICD-10-CM | POA: Diagnosis not present

## 2018-01-17 DIAGNOSIS — D631 Anemia in chronic kidney disease: Secondary | ICD-10-CM | POA: Diagnosis not present

## 2018-01-19 DIAGNOSIS — D631 Anemia in chronic kidney disease: Secondary | ICD-10-CM | POA: Diagnosis not present

## 2018-01-19 DIAGNOSIS — N186 End stage renal disease: Secondary | ICD-10-CM | POA: Diagnosis not present

## 2018-01-19 DIAGNOSIS — N2581 Secondary hyperparathyroidism of renal origin: Secondary | ICD-10-CM | POA: Diagnosis not present

## 2018-01-19 DIAGNOSIS — D509 Iron deficiency anemia, unspecified: Secondary | ICD-10-CM | POA: Diagnosis not present

## 2018-01-21 DIAGNOSIS — D631 Anemia in chronic kidney disease: Secondary | ICD-10-CM | POA: Diagnosis not present

## 2018-01-21 DIAGNOSIS — D509 Iron deficiency anemia, unspecified: Secondary | ICD-10-CM | POA: Diagnosis not present

## 2018-01-21 DIAGNOSIS — N2581 Secondary hyperparathyroidism of renal origin: Secondary | ICD-10-CM | POA: Diagnosis not present

## 2018-01-21 DIAGNOSIS — N186 End stage renal disease: Secondary | ICD-10-CM | POA: Diagnosis not present

## 2018-01-24 DIAGNOSIS — D509 Iron deficiency anemia, unspecified: Secondary | ICD-10-CM | POA: Diagnosis not present

## 2018-01-24 DIAGNOSIS — D631 Anemia in chronic kidney disease: Secondary | ICD-10-CM | POA: Diagnosis not present

## 2018-01-24 DIAGNOSIS — N2581 Secondary hyperparathyroidism of renal origin: Secondary | ICD-10-CM | POA: Diagnosis not present

## 2018-01-24 DIAGNOSIS — N186 End stage renal disease: Secondary | ICD-10-CM | POA: Diagnosis not present

## 2018-01-25 ENCOUNTER — Ambulatory Visit: Payer: Medicare Other | Admitting: Nurse Practitioner

## 2018-01-25 DIAGNOSIS — H26492 Other secondary cataract, left eye: Secondary | ICD-10-CM | POA: Diagnosis not present

## 2018-01-25 DIAGNOSIS — Z961 Presence of intraocular lens: Secondary | ICD-10-CM | POA: Diagnosis not present

## 2018-01-26 DIAGNOSIS — N2581 Secondary hyperparathyroidism of renal origin: Secondary | ICD-10-CM | POA: Diagnosis not present

## 2018-01-26 DIAGNOSIS — D631 Anemia in chronic kidney disease: Secondary | ICD-10-CM | POA: Diagnosis not present

## 2018-01-26 DIAGNOSIS — N186 End stage renal disease: Secondary | ICD-10-CM | POA: Diagnosis not present

## 2018-01-26 DIAGNOSIS — D509 Iron deficiency anemia, unspecified: Secondary | ICD-10-CM | POA: Diagnosis not present

## 2018-01-28 DIAGNOSIS — D509 Iron deficiency anemia, unspecified: Secondary | ICD-10-CM | POA: Diagnosis not present

## 2018-01-28 DIAGNOSIS — N2581 Secondary hyperparathyroidism of renal origin: Secondary | ICD-10-CM | POA: Diagnosis not present

## 2018-01-28 DIAGNOSIS — D631 Anemia in chronic kidney disease: Secondary | ICD-10-CM | POA: Diagnosis not present

## 2018-01-28 DIAGNOSIS — N186 End stage renal disease: Secondary | ICD-10-CM | POA: Diagnosis not present

## 2018-01-31 DIAGNOSIS — D631 Anemia in chronic kidney disease: Secondary | ICD-10-CM | POA: Diagnosis not present

## 2018-01-31 DIAGNOSIS — D509 Iron deficiency anemia, unspecified: Secondary | ICD-10-CM | POA: Diagnosis not present

## 2018-01-31 DIAGNOSIS — N2581 Secondary hyperparathyroidism of renal origin: Secondary | ICD-10-CM | POA: Diagnosis not present

## 2018-01-31 DIAGNOSIS — N186 End stage renal disease: Secondary | ICD-10-CM | POA: Diagnosis not present

## 2018-02-02 DIAGNOSIS — D631 Anemia in chronic kidney disease: Secondary | ICD-10-CM | POA: Diagnosis not present

## 2018-02-02 DIAGNOSIS — N2581 Secondary hyperparathyroidism of renal origin: Secondary | ICD-10-CM | POA: Diagnosis not present

## 2018-02-02 DIAGNOSIS — D509 Iron deficiency anemia, unspecified: Secondary | ICD-10-CM | POA: Diagnosis not present

## 2018-02-02 DIAGNOSIS — N186 End stage renal disease: Secondary | ICD-10-CM | POA: Diagnosis not present

## 2018-02-04 DIAGNOSIS — N186 End stage renal disease: Secondary | ICD-10-CM | POA: Diagnosis not present

## 2018-02-04 DIAGNOSIS — D631 Anemia in chronic kidney disease: Secondary | ICD-10-CM | POA: Diagnosis not present

## 2018-02-04 DIAGNOSIS — D509 Iron deficiency anemia, unspecified: Secondary | ICD-10-CM | POA: Diagnosis not present

## 2018-02-04 DIAGNOSIS — N2581 Secondary hyperparathyroidism of renal origin: Secondary | ICD-10-CM | POA: Diagnosis not present

## 2018-02-07 DIAGNOSIS — N186 End stage renal disease: Secondary | ICD-10-CM | POA: Diagnosis not present

## 2018-02-07 DIAGNOSIS — N2581 Secondary hyperparathyroidism of renal origin: Secondary | ICD-10-CM | POA: Diagnosis not present

## 2018-02-07 DIAGNOSIS — D631 Anemia in chronic kidney disease: Secondary | ICD-10-CM | POA: Diagnosis not present

## 2018-02-07 DIAGNOSIS — D509 Iron deficiency anemia, unspecified: Secondary | ICD-10-CM | POA: Diagnosis not present

## 2018-02-09 DIAGNOSIS — H26492 Other secondary cataract, left eye: Secondary | ICD-10-CM | POA: Diagnosis not present

## 2018-02-09 DIAGNOSIS — N186 End stage renal disease: Secondary | ICD-10-CM | POA: Diagnosis not present

## 2018-02-09 DIAGNOSIS — D509 Iron deficiency anemia, unspecified: Secondary | ICD-10-CM | POA: Diagnosis not present

## 2018-02-09 DIAGNOSIS — N2581 Secondary hyperparathyroidism of renal origin: Secondary | ICD-10-CM | POA: Diagnosis not present

## 2018-02-09 DIAGNOSIS — D631 Anemia in chronic kidney disease: Secondary | ICD-10-CM | POA: Diagnosis not present

## 2018-02-11 DIAGNOSIS — D631 Anemia in chronic kidney disease: Secondary | ICD-10-CM | POA: Diagnosis not present

## 2018-02-11 DIAGNOSIS — N2581 Secondary hyperparathyroidism of renal origin: Secondary | ICD-10-CM | POA: Diagnosis not present

## 2018-02-11 DIAGNOSIS — Z992 Dependence on renal dialysis: Secondary | ICD-10-CM | POA: Diagnosis not present

## 2018-02-11 DIAGNOSIS — N186 End stage renal disease: Secondary | ICD-10-CM | POA: Diagnosis not present

## 2018-02-11 DIAGNOSIS — T8612 Kidney transplant failure: Secondary | ICD-10-CM | POA: Diagnosis not present

## 2018-02-14 DIAGNOSIS — N2581 Secondary hyperparathyroidism of renal origin: Secondary | ICD-10-CM | POA: Diagnosis not present

## 2018-02-14 DIAGNOSIS — D631 Anemia in chronic kidney disease: Secondary | ICD-10-CM | POA: Diagnosis not present

## 2018-02-14 DIAGNOSIS — N186 End stage renal disease: Secondary | ICD-10-CM | POA: Diagnosis not present

## 2018-02-15 ENCOUNTER — Other Ambulatory Visit: Payer: Self-pay | Admitting: Internal Medicine

## 2018-02-15 DIAGNOSIS — H938X3 Other specified disorders of ear, bilateral: Secondary | ICD-10-CM | POA: Diagnosis not present

## 2018-02-15 DIAGNOSIS — I10 Essential (primary) hypertension: Secondary | ICD-10-CM | POA: Diagnosis not present

## 2018-02-16 DIAGNOSIS — D631 Anemia in chronic kidney disease: Secondary | ICD-10-CM | POA: Diagnosis not present

## 2018-02-16 DIAGNOSIS — N186 End stage renal disease: Secondary | ICD-10-CM | POA: Diagnosis not present

## 2018-02-16 DIAGNOSIS — N2581 Secondary hyperparathyroidism of renal origin: Secondary | ICD-10-CM | POA: Diagnosis not present

## 2018-02-16 NOTE — Progress Notes (Signed)
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  She remains on Plavix for secondary stroke prevention without further stroke or TIA symptoms.  She has no bruising and no bleeding.  Blood pressure in the office today 129/68.  She is on simvastatin for hyperlipidemia.  She denies myalgias.  She gets little exercise .  She was encouraged to rejoin [name].  30-day event monitor without any atrial fibrillation.  She remains on dialysis 3 times a week.  She has not had further neurologic complaints.  She returns for reevaluation   05/04/17 CM Kathryn Yale Hesteris an 67 y.o.femalewho had dialysis today when patient's sister went to pick her up and noted that she was not acting her usual self. Patient was taken to go to the ED. Patient on arrival stated she was fuzzy headed. She's had TIAs in the past. When asked how she feels she states that she just feels fuzzy headed, she is very lethargic and specifically denies having focal symptoms. She was was to have dialysis yesterday however she missed secondary to the snow. While in CT, patient had no localizing or lateralizing symptoms. The patient answered all questions appropriately. Patient does continue to say that she just does not feel "right in the head." Code stroke was canceled. She was LKE 02/23/2017 at 1100. Patient was not administered IV t-PA secondary to no focal neurologic symptoms. 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 MRA no significant findings. Carotid Doppler unremarkable. 2-D echo 50-55%EF. LDL 101. Hemoglobin A1c 4.6 patient was on aspirin 325 daily and switched to Plavix. She returns to the stroke clinic today for follow-up. She remains on  Plavix for secondary stroke prevention without further stroke or TIA symptoms. She has minimal bruising and no bleeding she is also on Zocor for hyperlipidemia without complaints of myalgias. Blood pressure in the office today 160/80. She is a dialysis patient. She had 30 day event monitoring done and just turned in to rule out atrial fibrillation. No results yet. She has stopped her [name] and was encouraged to reenroll for exercise. She returns for reevaluation  REVIEW OF SYSTEMS: Full 14 system review of systems performed and notable only for those listed, all others are neg:  Constitutional: Fatigue  Cardiovascular: neg Ear/Nose/Throat: neg  Skin: neg Eyes: neg Respiratory: neg Gastroitestinal: neg  Hematology/Lymphatic: neg  Endocrine: neg Musculoskeletal:neg Allergy/Immunology: neg Neurological: neg Psychiatric: Depression anxiety Sleep : neg   ALLERGIES: Allergies  Allergen Reactions  . Sulfa Antibiotics Other (See Comments)    Both parents allergic-so will not take  . Adhesive [Tape] Itching    HOME MEDICATIONS: Outpatient Medications Prior to Visit  Medication Sig Dispense Refill  . acetaminophen (TYLENOL) 325 MG tablet Take 650 mg by mouth every 6 (six) hours as needed.    Marland Kitchen allopurinol (ZYLOPRIM) 300 MG tablet Take 150 mg by mouth at bedtime.    . ALPRAZolam (XANAX) 0.25 MG tablet Take 0.25 mg by mouth 2 (two) times daily as needed for anxiety.     Marland Kitchen amLODipine (NORVASC) 10 MG tablet 10 mg at bedtime.  3  . B Complex-C-Folic Acid (DIALYVITE PO) Take 1 tablet by mouth daily.    . clopidogrel (PLAVIX) 75 MG tablet Take 1 tablet (75 mg  total) by mouth daily. 30 tablet 0  . diclofenac sodium (VOLTAREN) 1 % GEL Apply 2 g topically 4 (four) times daily. 100 g 0  . doxazosin (CARDURA) 8 MG tablet Take 1 tablet (8 mg total) by mouth at bedtime.    Marland Kitchen HYDROcodone-acetaminophen (NORCO/VICODIN) 5-325 MG tablet Take 1-2 tablets by mouth every 6 hours as needed for pain  and/or cough. 7 tablet 0  . labetalol (NORMODYNE) 200 MG tablet Take 200 mg by mouth 2 (two) times daily.     Marland Kitchen lactulose (CHRONULAC) 10 GM/15ML solution Take 20 g by mouth daily as needed for mild constipation.    Marland Kitchen lanthanum (FOSRENOL) 1000 MG chewable tablet Chew 1,000 mg by mouth 2 (two) times daily with a meal.    . losartan (COZAAR) 100 MG tablet Take 100 mg by mouth at bedtime.     . meclizine (ANTIVERT) 25 MG tablet Take 1 tablet (25 mg total) by mouth 2 (two) times daily as needed for dizziness. 30 tablet 0  . ondansetron (ZOFRAN) 8 MG tablet TK 1 T PO  BID FOR 4 DAYS  0  . SENSIPAR 30 MG tablet Take 30 mg by mouth every Monday, Wednesday, and Friday with hemodialysis.     Marland Kitchen simvastatin (ZOCOR) 20 MG tablet Take 1 tablet (20 mg total) by mouth at bedtime. (Patient taking differently: Take 10 mg by mouth at bedtime. ) 30 tablet 0  . zolpidem (AMBIEN) 10 MG tablet take 10 mg in the evening     No facility-administered medications prior to visit.     PAST MEDICAL HISTORY: Past Medical History:  Diagnosis Date  . Adenomatous polyp of colon 10/2010, 2006, 2015  . Anemia in CKD (chronic kidney disease) 11/07/2012   s/p blood transfusion.   . Arthritis   . CHF (congestive heart failure) ([place])   . Constipation   . Depression with anxiety   . Dialysis patient ([place])    Mon-Wed-Fri  . Diverticula, colon   . 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  Had deceased donor kidney transplant in 1996.  Had some early rejection then stable function for years, then had slow decline of function and went back on hemodialysis in 2012.  Gets HD TTS schedule at [facility] still using L forearm AVF.     Marland Kitchen GERD (gastroesophageal reflux disease)   . Headache   . Hyperlipidemia   . Hypertension   . Neuromuscular disorder (HCC)    neuropathy hand and legs  . Osteoporosis   . Pneumonia   . Pseudoaneurysm of  surgical AV fistula (HCC)    left upper arm  . Stroke [facility]) 11/2015   TIA  . Weight loss, unintentional     PAST SURGICAL HISTORY: Past Surgical History:  Procedure Laterality Date  . AV FISTULA PLACEMENT     for dialysis  . AV FISTULA PLACEMENT Left 11/22/2015   Procedure: ARTERIOVENOUS (AV) FISTULA CREATION-LEFT BRACHIOCEPHALIC;  Surgeon: Serafina Mitchell, MD;  Location: [place];  Service: Vascular;  Laterality: Left;  . BACK SURGERY    . CERVICAL FUSION    . CHOLECYSTECTOMY  12/02/2012   Procedure: LAPAROSCOPIC CHOLECYSTECTOMY WITH INTRAOPERATIVE CHOLANGIOGRAM;  Surgeon: Edward Jolly, MD;  Location: [place];  Service: General;  Laterality: N/A;  . EYE SURGERY Bilateral    cataract surgery  . EYE SURGERY Left 2019   laser  . HEMATOMA EVACUATION  Left 12/24/2016   Procedure: EVACUATION HEMATOMA LEFT UPPER ARM;  Surgeon: Waynetta Sandy, MD;  Location: [place];  Service: Vascular;  Laterality: Left;  . I&D EXTREMITY Left 12/31/2016   Procedure: IRRIGATION AND DEBRIDEMENT EXTREMITY;  Surgeon: Angelia Mould, MD;  Location: [place];  Service: Vascular;  Laterality: Left;  . INSERTION OF DIALYSIS CATHETER Right 12/24/2016   Procedure: INSERTION OF DIALYSIS CATHETER;  Surgeon: Waynetta Sandy, MD;  Location: [place];  Service: Vascular;  Laterality: Right;  . INSERTION OF DIALYSIS CATHETER Right 02/04/2017   Procedure: INSERTION OF DIALYSIS CATHETER;  Surgeon: Waynetta Sandy, MD;  Location: [place];  Service: Vascular;  Laterality: Right;  . KIDNEY TRANSPLANT  1996  . PERIPHERAL VASCULAR CATHETERIZATION Left 10/23/2016   Procedure: Fistulagram;  Surgeon: Elam Dutch, MD;  Location: [place] [facility];  Service: Cardiovascular;  Laterality: Left;  . RESECTION OF ARTERIOVENOUS FISTULA ANEURYSM Left 11/22/2015   Procedure: RESECTION OF LEFT RADIOCEPHALIC FISTULA ANEURYSM ;  Surgeon: Serafina Mitchell, MD;  Location: [place];  Service: Vascular;  Laterality: Left;  .  REVISON OF ARTERIOVENOUS FISTULA Left 12/22/2016   Procedure: REVISON OF LEFT ARTERIOVENOUS FISTULA;  Surgeon: Waynetta Sandy, MD;  Location: [place];  Service: Vascular;  Laterality: Left;  . REVISON OF ARTERIOVENOUS FISTULA Left 02/04/2017   Procedure: REVISON OF LEFT UPPER ARM ARTERIOVENOUS FISTULA;  Surgeon: Waynetta Sandy, MD;  Location: [place];  Service: Vascular;  Laterality: Left;    FAMILY HISTORY: Family History  Problem Relation Age of Onset  . Colon cancer Brother   . Cancer Brother   . Coronary artery disease Mother 38  . Hyperlipidemia Mother   . Hypertension Mother   . Stroke Maternal Aunt   . Esophageal cancer Neg Hx   . Stomach cancer Neg Hx   . Rectal cancer Neg Hx     SOCIAL HISTORY: Social History   Socioeconomic History  . Marital status: Widowed    Spouse name: Not on file  . Number of children: 2  . Years of education: Not on file  . Highest education level: Not on file  Social Needs  . Financial resource strain: Not on file  . Food insecurity - worry: Not on file  . Food insecurity - inability: Not on file  . Transportation needs - medical: Not on file  . Transportation needs - non-medical: Not on file  Occupational History  . Not on file  Tobacco Use  . Smoking status: Former Smoker    Types: Cigarettes    Last attempt to quit: 12/31/1991    Years since quitting: 26.1  . Smokeless tobacco: Never Used  Substance and Sexual Activity  . Alcohol use: No    Alcohol/week: 0.0 oz  . Drug use: No  . Sexual activity: No  Other Topics Concern  . Not on file  Social History Narrative  . Not on file     PHYSICAL EXAM  Vitals:   02/17/18 0945  BP: 129/68  Pulse: 84  Weight: 119 lb 9.6 oz (54.3 kg)   Body mass index is 21.88 kg/m.  Generalized: Well developed, Frail appearing female in no acute distress  Head: normocephalic and atraumatic,. Oropharynx benign  Neck: Supple, questionable left bruit  Cardiac: Regular rate  rhythm, no murmur  Musculoskeletal: No deformity   Neurological examination   Mentation: Alert oriented to time, place, history taking. Attention span and concentration appropriate. Recent and remote memory intact.  Follows all commands  speech and language fluent.   Cranial nerve II-XII: Pupils were equal round reactive to light extraocular movements were full, visual field were full on confrontational test. Facial sensation and strength were normal. hearing was intact to finger rubbing bilaterally. Uvula tongue midline. head turning and shoulder shrug were normal and symmetric.Tongue protrusion into cheek strength was normal. Motor: normal bulk and tone, full strength in the BUE, BLE, fine finger movements normal, no pronator drift. No focal weakness Sensory: normal and symmetric to light touch, pinprick, and  Vibration, in the upper and lower extremities  Coordination: finger-nose-finger, heel-to-shin bilaterally, no dysmetria, no tremor Reflexes: Symmetric upper and lower plantar responses were flexor bilaterally. Gait and Station: Rising up from seated position without assistance, normal stance,  moderate stride, good arm swing, smooth turning, able to perform tiptoe, and heel walking without difficulty. Tandem gait is mildly unsteady.  No assistive device  DIAGNOSTIC DATA (LABS, IMAGING, TESTING) - I reviewed patient records, labs, notes, testing and imaging myself where available.  Lab Results  Component Value Date   WBC 7.5 09/30/2017   HGB 11.4 (L) 09/30/2017   HCT 35.7 (L) 09/30/2017   MCV 95.7 09/30/2017   PLT 212 09/30/2017      Component Value Date/Time   NA 137 09/30/2017 1557   K 4.4 09/30/2017 1557   CL 90 (L) 09/30/2017 1557   CO2 32 09/30/2017 1557   GLUCOSE 96 09/30/2017 1557   BUN 26 (H) 09/30/2017 1557   CREATININE 5.83 (H) 09/30/2017 1557   CALCIUM 8.8 (L) 09/30/2017 1557   CALCIUM 9.6 08/03/2011 1005   PROT 7.0 05/12/2017 0912   ALBUMIN 3.3 (L) 05/12/2017  0912   AST 25 05/12/2017 0912   ALT 10 (L) 05/12/2017 0912   ALKPHOS 74 05/12/2017 0912   BILITOT 0.9 05/12/2017 0912   GFRNONAA 7 (L) 09/30/2017 1557   GFRAA 8 (L) 09/30/2017 1557   Lab Results  Component Value Date   CHOL 171 02/24/2017   HDL 52 02/24/2017   LDLCALC 101 (H) 02/24/2017   TRIG 92 02/24/2017   CHOLHDL 3.3 02/24/2017   Lab Results  Component Value Date   HGBA1C 4.6 (L) 02/24/2017    Lab Results  Component Value Date   TSH 2.828 02/24/2017      ASSESSMENT AND PLAN  67 y.o. year old female  has a past medical history of  Anemia in CKD (chronic kidney disease) (11/07/2012); Arthritis; CHF (congestive heart failure) ([place]);  Depression with anxiety;  (end stage renal disease) ([place]) (11/07/2012);  Headache; Hyperlipidemia; Hypertension; Neuromuscular disorder ([place]); Stroke [facility]) (11/2015); and Weight loss, unintentional. 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 The patient is a current patient of Dr. Erlinda Hong  who is out of the office today . This note is sent to the work in doctor.     PLAN;Stressed the importance of management of risk factors to prevent further stroke Continue Plavix for secondary stroke prevention Maintain strict control of hypertension with blood pressure goal below 130/90, today's reading 129/68 continue antihypertensive medications Control of diabetes with hemoglobin A1c below 6.5 followed by primary care  Cholesterol with LDL cholesterol less than 70, followed by primary care, continue statin drugs Zocor Continue follow-up with primary care provider for stroke risk factor modification, maintain blood pressure goal less than 637 systolic, diabetes with C5Y below 7 and lipids with LDL below 70. Exercise by walking, encouraged to get back into [name] slowly increase ,  eat healthy diet  with whole grains,  fresh fruits and vegetables no salt Keep appt for F/U  Carotid doppler 02/22/18 Discharge from stroke clinic I spent 25  minutes in total face to face time with the patient more than 50% of which was spent counseling and coordination of care, reviewing test results reviewing medications and discussing and reviewing the diagnosis of stroke and management of risk factors.  Patient was given additional written information Dennie Bible, [facility], [facility], APRN  [facility] [address] [place], [place] [address] [phone]

## 2018-02-17 ENCOUNTER — Ambulatory Visit (INDEPENDENT_AMBULATORY_CARE_PROVIDER_SITE_OTHER): Payer: Medicare Other | Admitting: Nurse Practitioner

## 2018-02-17 ENCOUNTER — Encounter: Payer: Self-pay | Admitting: Nurse Practitioner

## 2018-02-17 VITALS — BP 129/68 | HR 84 | Wt 119.6 lb

## 2018-02-17 DIAGNOSIS — I10 Essential (primary) hypertension: Secondary | ICD-10-CM

## 2018-02-17 DIAGNOSIS — E785 Hyperlipidemia, unspecified: Secondary | ICD-10-CM

## 2018-02-17 DIAGNOSIS — Z8673 Personal history of transient ischemic attack (TIA), and cerebral infarction without residual deficits: Secondary | ICD-10-CM

## 2018-02-17 NOTE — Patient Instructions (Addendum)
Stressed the importance of management of risk factors to prevent further stroke Continue Plavix for secondary stroke prevention Maintain strict control of hypertension with blood pressure goal below 130/90, today's reading 129/68 continue antihypertensive medications Control of diabetes with hemoglobin A1c below 6.5 followed by primary care Cholesterol with LDL cholesterol less than 70, followed by primary care,   continue statin drugs Zocor Exercise by walking, encouraged to get back into Silver sneakers slowly increase , eat healthy diet with whole grains,  fresh fruits and vegetables no salt Keep appt for F/U  Carotid doppler 02/22/18 Discharge from stroke clinic Stroke Prevention Some health problems and behaviors may make it more likely for you to have a stroke. Below are ways to lessen your risk of having a stroke.  Be active for at least 30 minutes on most or all days.  Do not smoke. Try not to be around others who smoke.  Do not drink too much alcohol. ? Do not have more than 2 drinks a day if you are a man. ? Do not have more than 1 drink a day if you are a woman and are not pregnant.  Eat healthy foods, such as fruits and vegetables. If you were put on a specific diet, follow the diet as told.  Keep your cholesterol levels under control through diet and medicines. Look for foods that are low in saturated fat, trans fat, cholesterol, and are high in fiber.  If you have diabetes, follow all diet plans and take your medicine as told.  Ask your doctor if you need treatment to lower your blood pressure. If you have high blood pressure (hypertension), follow all diet plans and take your medicine as told by your doctor.  If you are 57-21 years old, have your blood pressure checked every 3-5 years. If you are age 56 or older, have your blood pressure checked every year.  Keep a healthy weight. Eat foods that are low in calories, salt, saturated fat, trans fat, and cholesterol.  Do not  take drugs.  Avoid birth control pills, if this applies. Talk to your doctor about the risks of taking birth control pills.  Talk to your doctor if you have sleep problems (sleep apnea).  Take all medicine as told by your doctor. ? You may be told to take aspirin or blood thinner medicine. Take this medicine as told by your doctor. ? Understand your medicine instructions.  Make sure any other conditions you have are being taken care of.  Get help right away if:  You suddenly lose feeling (you feel numb) or have weakness in your face, arm, or leg.  Your face or eyelid hangs down to one side.  You suddenly feel confused.  You have trouble talking (aphasia) or understanding what people are saying.  You suddenly have trouble seeing in one or both eyes.  You suddenly have trouble walking.  You are dizzy.  You lose your balance or your movements are clumsy (uncoordinated).  You suddenly have a very bad headache and you do not know the cause.  You have new chest pain.  Your heart feels like it is fluttering or skipping a beat (irregular heartbeat). Do not wait to see if the symptoms above go away. Get help right away. Call your local emergency services (911 in [place] Do not drive yourself to the hospital. This information is not intended to replace advice given to you by your health care provider. Make sure you discuss any questions you have with  your health care provider. Document Released: 05/31/2012 Document Revised: 05/07/2016 Document Reviewed: 06/02/2013 Elsevier Interactive Patient Education  [employer].

## 2018-02-17 NOTE — Progress Notes (Signed)
I have read the note, and I agree with the clinical assessment and plan.  Charles K Willis   

## 2018-02-18 DIAGNOSIS — N2581 Secondary hyperparathyroidism of renal origin: Secondary | ICD-10-CM | POA: Diagnosis not present

## 2018-02-18 DIAGNOSIS — D631 Anemia in chronic kidney disease: Secondary | ICD-10-CM | POA: Diagnosis not present

## 2018-02-18 DIAGNOSIS — N186 End stage renal disease: Secondary | ICD-10-CM | POA: Diagnosis not present

## 2018-02-21 DIAGNOSIS — N186 End stage renal disease: Secondary | ICD-10-CM | POA: Diagnosis not present

## 2018-02-21 DIAGNOSIS — N2581 Secondary hyperparathyroidism of renal origin: Secondary | ICD-10-CM | POA: Diagnosis not present

## 2018-02-21 DIAGNOSIS — D631 Anemia in chronic kidney disease: Secondary | ICD-10-CM | POA: Diagnosis not present

## 2018-02-22 ENCOUNTER — Ambulatory Visit
Admission: RE | Admit: 2018-02-22 | Discharge: 2018-02-22 | Disposition: A | Discharge status: Home / Self Care | Payer: Medicare Other | Source: Ambulatory Visit | Attending: Internal Medicine | Admitting: Internal Medicine

## 2018-02-22 DIAGNOSIS — I6523 Occlusion and stenosis of bilateral carotid arteries: Secondary | ICD-10-CM | POA: Diagnosis not present

## 2018-02-22 DIAGNOSIS — H938X3 Other specified disorders of ear, bilateral: Secondary | ICD-10-CM

## 2018-02-23 DIAGNOSIS — D631 Anemia in chronic kidney disease: Secondary | ICD-10-CM | POA: Diagnosis not present

## 2018-02-23 DIAGNOSIS — N186 End stage renal disease: Secondary | ICD-10-CM | POA: Diagnosis not present

## 2018-02-23 DIAGNOSIS — N2581 Secondary hyperparathyroidism of renal origin: Secondary | ICD-10-CM | POA: Diagnosis not present

## 2018-02-25 DIAGNOSIS — N186 End stage renal disease: Secondary | ICD-10-CM | POA: Diagnosis not present

## 2018-02-25 DIAGNOSIS — N2581 Secondary hyperparathyroidism of renal origin: Secondary | ICD-10-CM | POA: Diagnosis not present

## 2018-02-25 DIAGNOSIS — D631 Anemia in chronic kidney disease: Secondary | ICD-10-CM | POA: Diagnosis not present

## 2018-02-28 DIAGNOSIS — N186 End stage renal disease: Secondary | ICD-10-CM | POA: Diagnosis not present

## 2018-02-28 DIAGNOSIS — D631 Anemia in chronic kidney disease: Secondary | ICD-10-CM | POA: Diagnosis not present

## 2018-02-28 DIAGNOSIS — N2581 Secondary hyperparathyroidism of renal origin: Secondary | ICD-10-CM | POA: Diagnosis not present

## 2018-03-02 DIAGNOSIS — N186 End stage renal disease: Secondary | ICD-10-CM | POA: Diagnosis not present

## 2018-03-02 DIAGNOSIS — N2581 Secondary hyperparathyroidism of renal origin: Secondary | ICD-10-CM | POA: Diagnosis not present

## 2018-03-02 DIAGNOSIS — D631 Anemia in chronic kidney disease: Secondary | ICD-10-CM | POA: Diagnosis not present

## 2018-03-04 DIAGNOSIS — N186 End stage renal disease: Secondary | ICD-10-CM | POA: Diagnosis not present

## 2018-03-04 DIAGNOSIS — N2581 Secondary hyperparathyroidism of renal origin: Secondary | ICD-10-CM | POA: Diagnosis not present

## 2018-03-04 DIAGNOSIS — D631 Anemia in chronic kidney disease: Secondary | ICD-10-CM | POA: Diagnosis not present

## 2018-03-07 DIAGNOSIS — N186 End stage renal disease: Secondary | ICD-10-CM | POA: Diagnosis not present

## 2018-03-07 DIAGNOSIS — D631 Anemia in chronic kidney disease: Secondary | ICD-10-CM | POA: Diagnosis not present

## 2018-03-07 DIAGNOSIS — N2581 Secondary hyperparathyroidism of renal origin: Secondary | ICD-10-CM | POA: Diagnosis not present

## 2018-03-09 DIAGNOSIS — D631 Anemia in chronic kidney disease: Secondary | ICD-10-CM | POA: Diagnosis not present

## 2018-03-09 DIAGNOSIS — N2581 Secondary hyperparathyroidism of renal origin: Secondary | ICD-10-CM | POA: Diagnosis not present

## 2018-03-09 DIAGNOSIS — N186 End stage renal disease: Secondary | ICD-10-CM | POA: Diagnosis not present

## 2018-03-11 DIAGNOSIS — D631 Anemia in chronic kidney disease: Secondary | ICD-10-CM | POA: Diagnosis not present

## 2018-03-11 DIAGNOSIS — N2581 Secondary hyperparathyroidism of renal origin: Secondary | ICD-10-CM | POA: Diagnosis not present

## 2018-03-11 DIAGNOSIS — N186 End stage renal disease: Secondary | ICD-10-CM | POA: Diagnosis not present

## 2018-03-14 DIAGNOSIS — N186 End stage renal disease: Secondary | ICD-10-CM | POA: Diagnosis not present

## 2018-03-14 DIAGNOSIS — N2581 Secondary hyperparathyroidism of renal origin: Secondary | ICD-10-CM | POA: Diagnosis not present

## 2018-03-14 DIAGNOSIS — Z992 Dependence on renal dialysis: Secondary | ICD-10-CM | POA: Diagnosis not present

## 2018-03-14 DIAGNOSIS — D631 Anemia in chronic kidney disease: Secondary | ICD-10-CM | POA: Diagnosis not present

## 2018-03-14 DIAGNOSIS — T8612 Kidney transplant failure: Secondary | ICD-10-CM | POA: Diagnosis not present

## 2018-03-16 DIAGNOSIS — N186 End stage renal disease: Secondary | ICD-10-CM | POA: Diagnosis not present

## 2018-03-16 DIAGNOSIS — N2581 Secondary hyperparathyroidism of renal origin: Secondary | ICD-10-CM | POA: Diagnosis not present

## 2018-03-16 DIAGNOSIS — D631 Anemia in chronic kidney disease: Secondary | ICD-10-CM | POA: Diagnosis not present

## 2018-03-18 DIAGNOSIS — N2581 Secondary hyperparathyroidism of renal origin: Secondary | ICD-10-CM | POA: Diagnosis not present

## 2018-03-18 DIAGNOSIS — D631 Anemia in chronic kidney disease: Secondary | ICD-10-CM | POA: Diagnosis not present

## 2018-03-18 DIAGNOSIS — N186 End stage renal disease: Secondary | ICD-10-CM | POA: Diagnosis not present

## 2018-03-21 DIAGNOSIS — N186 End stage renal disease: Secondary | ICD-10-CM | POA: Diagnosis not present

## 2018-03-21 DIAGNOSIS — D631 Anemia in chronic kidney disease: Secondary | ICD-10-CM | POA: Diagnosis not present

## 2018-03-21 DIAGNOSIS — N2581 Secondary hyperparathyroidism of renal origin: Secondary | ICD-10-CM | POA: Diagnosis not present

## 2018-03-23 DIAGNOSIS — N2581 Secondary hyperparathyroidism of renal origin: Secondary | ICD-10-CM | POA: Diagnosis not present

## 2018-03-23 DIAGNOSIS — N186 End stage renal disease: Secondary | ICD-10-CM | POA: Diagnosis not present

## 2018-03-23 DIAGNOSIS — D631 Anemia in chronic kidney disease: Secondary | ICD-10-CM | POA: Diagnosis not present

## 2018-03-25 DIAGNOSIS — D631 Anemia in chronic kidney disease: Secondary | ICD-10-CM | POA: Diagnosis not present

## 2018-03-25 DIAGNOSIS — N186 End stage renal disease: Secondary | ICD-10-CM | POA: Diagnosis not present

## 2018-03-25 DIAGNOSIS — N2581 Secondary hyperparathyroidism of renal origin: Secondary | ICD-10-CM | POA: Diagnosis not present

## 2018-03-28 DIAGNOSIS — D631 Anemia in chronic kidney disease: Secondary | ICD-10-CM | POA: Diagnosis not present

## 2018-03-28 DIAGNOSIS — N186 End stage renal disease: Secondary | ICD-10-CM | POA: Diagnosis not present

## 2018-03-28 DIAGNOSIS — N2581 Secondary hyperparathyroidism of renal origin: Secondary | ICD-10-CM | POA: Diagnosis not present

## 2018-03-30 DIAGNOSIS — N186 End stage renal disease: Secondary | ICD-10-CM | POA: Diagnosis not present

## 2018-03-30 DIAGNOSIS — D631 Anemia in chronic kidney disease: Secondary | ICD-10-CM | POA: Diagnosis not present

## 2018-03-30 DIAGNOSIS — N2581 Secondary hyperparathyroidism of renal origin: Secondary | ICD-10-CM | POA: Diagnosis not present

## 2018-04-01 DIAGNOSIS — D631 Anemia in chronic kidney disease: Secondary | ICD-10-CM | POA: Diagnosis not present

## 2018-04-01 DIAGNOSIS — N186 End stage renal disease: Secondary | ICD-10-CM | POA: Diagnosis not present

## 2018-04-01 DIAGNOSIS — N2581 Secondary hyperparathyroidism of renal origin: Secondary | ICD-10-CM | POA: Diagnosis not present

## 2018-04-04 DIAGNOSIS — N186 End stage renal disease: Secondary | ICD-10-CM | POA: Diagnosis not present

## 2018-04-04 DIAGNOSIS — D631 Anemia in chronic kidney disease: Secondary | ICD-10-CM | POA: Diagnosis not present

## 2018-04-04 DIAGNOSIS — N2581 Secondary hyperparathyroidism of renal origin: Secondary | ICD-10-CM | POA: Diagnosis not present

## 2018-04-06 DIAGNOSIS — D631 Anemia in chronic kidney disease: Secondary | ICD-10-CM | POA: Diagnosis not present

## 2018-04-06 DIAGNOSIS — N186 End stage renal disease: Secondary | ICD-10-CM | POA: Diagnosis not present

## 2018-04-06 DIAGNOSIS — N2581 Secondary hyperparathyroidism of renal origin: Secondary | ICD-10-CM | POA: Diagnosis not present

## 2018-04-11 DIAGNOSIS — D631 Anemia in chronic kidney disease: Secondary | ICD-10-CM | POA: Diagnosis not present

## 2018-04-11 DIAGNOSIS — N186 End stage renal disease: Secondary | ICD-10-CM | POA: Diagnosis not present

## 2018-04-11 DIAGNOSIS — N2581 Secondary hyperparathyroidism of renal origin: Secondary | ICD-10-CM | POA: Diagnosis not present

## 2018-04-13 DIAGNOSIS — Z992 Dependence on renal dialysis: Secondary | ICD-10-CM | POA: Diagnosis not present

## 2018-04-13 DIAGNOSIS — T8612 Kidney transplant failure: Secondary | ICD-10-CM | POA: Diagnosis not present

## 2018-04-13 DIAGNOSIS — N186 End stage renal disease: Secondary | ICD-10-CM | POA: Diagnosis not present

## 2018-04-13 DIAGNOSIS — D631 Anemia in chronic kidney disease: Secondary | ICD-10-CM | POA: Diagnosis not present

## 2018-04-13 DIAGNOSIS — N2581 Secondary hyperparathyroidism of renal origin: Secondary | ICD-10-CM | POA: Diagnosis not present

## 2018-04-15 DIAGNOSIS — D631 Anemia in chronic kidney disease: Secondary | ICD-10-CM | POA: Diagnosis not present

## 2018-04-15 DIAGNOSIS — N2581 Secondary hyperparathyroidism of renal origin: Secondary | ICD-10-CM | POA: Diagnosis not present

## 2018-04-15 DIAGNOSIS — N186 End stage renal disease: Secondary | ICD-10-CM | POA: Diagnosis not present

## 2018-04-18 DIAGNOSIS — D631 Anemia in chronic kidney disease: Secondary | ICD-10-CM | POA: Diagnosis not present

## 2018-04-18 DIAGNOSIS — N186 End stage renal disease: Secondary | ICD-10-CM | POA: Diagnosis not present

## 2018-04-18 DIAGNOSIS — N2581 Secondary hyperparathyroidism of renal origin: Secondary | ICD-10-CM | POA: Diagnosis not present

## 2018-04-20 DIAGNOSIS — D631 Anemia in chronic kidney disease: Secondary | ICD-10-CM | POA: Diagnosis not present

## 2018-04-20 DIAGNOSIS — N186 End stage renal disease: Secondary | ICD-10-CM | POA: Diagnosis not present

## 2018-04-20 DIAGNOSIS — N2581 Secondary hyperparathyroidism of renal origin: Secondary | ICD-10-CM | POA: Diagnosis not present

## 2018-04-22 DIAGNOSIS — D631 Anemia in chronic kidney disease: Secondary | ICD-10-CM | POA: Diagnosis not present

## 2018-04-22 DIAGNOSIS — N2581 Secondary hyperparathyroidism of renal origin: Secondary | ICD-10-CM | POA: Diagnosis not present

## 2018-04-22 DIAGNOSIS — N186 End stage renal disease: Secondary | ICD-10-CM | POA: Diagnosis not present

## 2018-04-25 DIAGNOSIS — N2581 Secondary hyperparathyroidism of renal origin: Secondary | ICD-10-CM | POA: Diagnosis not present

## 2018-04-25 DIAGNOSIS — N186 End stage renal disease: Secondary | ICD-10-CM | POA: Diagnosis not present

## 2018-04-25 DIAGNOSIS — D631 Anemia in chronic kidney disease: Secondary | ICD-10-CM | POA: Diagnosis not present

## 2018-04-27 DIAGNOSIS — D631 Anemia in chronic kidney disease: Secondary | ICD-10-CM | POA: Diagnosis not present

## 2018-04-27 DIAGNOSIS — N186 End stage renal disease: Secondary | ICD-10-CM | POA: Diagnosis not present

## 2018-04-27 DIAGNOSIS — N2581 Secondary hyperparathyroidism of renal origin: Secondary | ICD-10-CM | POA: Diagnosis not present

## 2018-04-29 DIAGNOSIS — N186 End stage renal disease: Secondary | ICD-10-CM | POA: Diagnosis not present

## 2018-04-29 DIAGNOSIS — N2581 Secondary hyperparathyroidism of renal origin: Secondary | ICD-10-CM | POA: Diagnosis not present

## 2018-04-29 DIAGNOSIS — D631 Anemia in chronic kidney disease: Secondary | ICD-10-CM | POA: Diagnosis not present

## 2018-05-02 DIAGNOSIS — N2581 Secondary hyperparathyroidism of renal origin: Secondary | ICD-10-CM | POA: Diagnosis not present

## 2018-05-02 DIAGNOSIS — D631 Anemia in chronic kidney disease: Secondary | ICD-10-CM | POA: Diagnosis not present

## 2018-05-02 DIAGNOSIS — N186 End stage renal disease: Secondary | ICD-10-CM | POA: Diagnosis not present

## 2018-05-04 DIAGNOSIS — D631 Anemia in chronic kidney disease: Secondary | ICD-10-CM | POA: Diagnosis not present

## 2018-05-04 DIAGNOSIS — N2581 Secondary hyperparathyroidism of renal origin: Secondary | ICD-10-CM | POA: Diagnosis not present

## 2018-05-04 DIAGNOSIS — N186 End stage renal disease: Secondary | ICD-10-CM | POA: Diagnosis not present

## 2018-05-05 ENCOUNTER — Other Ambulatory Visit: Payer: Self-pay | Admitting: Internal Medicine

## 2018-05-05 DIAGNOSIS — R42 Dizziness and giddiness: Secondary | ICD-10-CM

## 2018-05-05 DIAGNOSIS — I10 Essential (primary) hypertension: Secondary | ICD-10-CM | POA: Diagnosis not present

## 2018-05-06 ENCOUNTER — Ambulatory Visit (HOSPITAL_BASED_OUTPATIENT_CLINIC_OR_DEPARTMENT_OTHER): Payer: Medicare Other

## 2018-05-06 DIAGNOSIS — D631 Anemia in chronic kidney disease: Secondary | ICD-10-CM | POA: Diagnosis not present

## 2018-05-06 DIAGNOSIS — N2581 Secondary hyperparathyroidism of renal origin: Secondary | ICD-10-CM | POA: Diagnosis not present

## 2018-05-06 DIAGNOSIS — N186 End stage renal disease: Secondary | ICD-10-CM | POA: Diagnosis not present

## 2018-05-09 DIAGNOSIS — D631 Anemia in chronic kidney disease: Secondary | ICD-10-CM | POA: Diagnosis not present

## 2018-05-09 DIAGNOSIS — N2581 Secondary hyperparathyroidism of renal origin: Secondary | ICD-10-CM | POA: Diagnosis not present

## 2018-05-09 DIAGNOSIS — N186 End stage renal disease: Secondary | ICD-10-CM | POA: Diagnosis not present

## 2018-05-09 IMAGING — CT CT ANGIO CHEST
2 of 6 series · 18 of 46 positions shown · IV contrast (isovue)
Comparison: Chest radiograph 09/30/2017

CLINICAL DATA: Acute chest pain and shortness of breath

EXAM:
CT ANGIOGRAPHY CHEST WITH CONTRAST
TECHNIQUE: Multidetector CT imaging of the chest was performed using the
standard protocol during bolus administration of intravenous
contrast. Multiplanar CT image reconstructions and MIPs were
obtained to evaluate the vascular anatomy.
CONTRAST:  80 mL Isovue 370

[Series 7: thins · axial · 0.62mm/px · z∈[+1038,+1258]mm · 15 of 343 slices shown]
[im 15/343  lung]
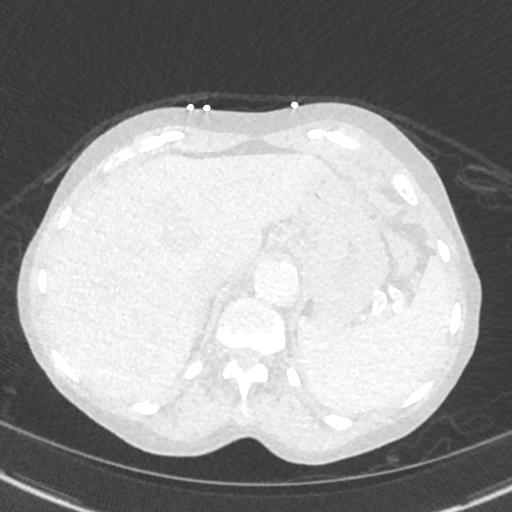
[im 45/343  soft-tissue]
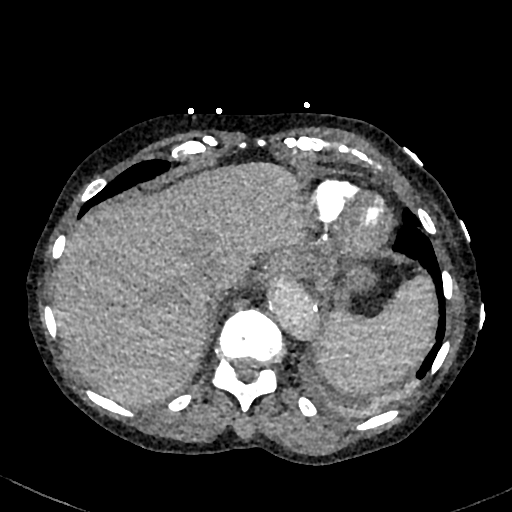
[im 60/343  lung]
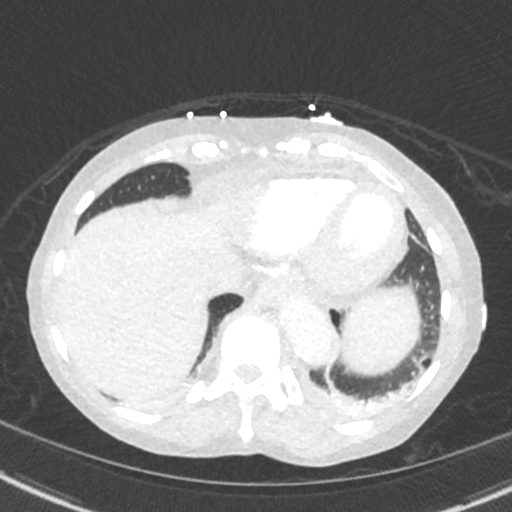
[im 90/343  soft-tissue]
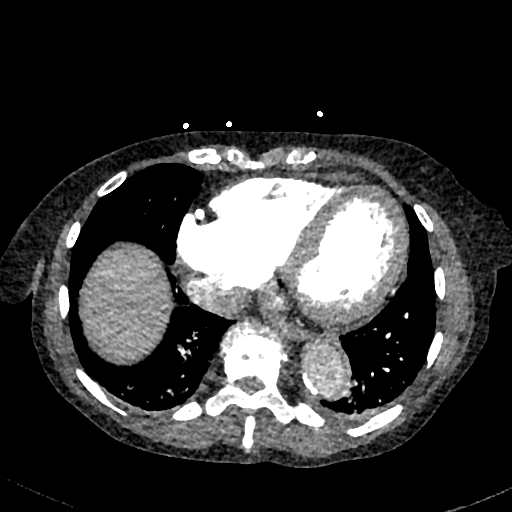
[im 105/343  lung]
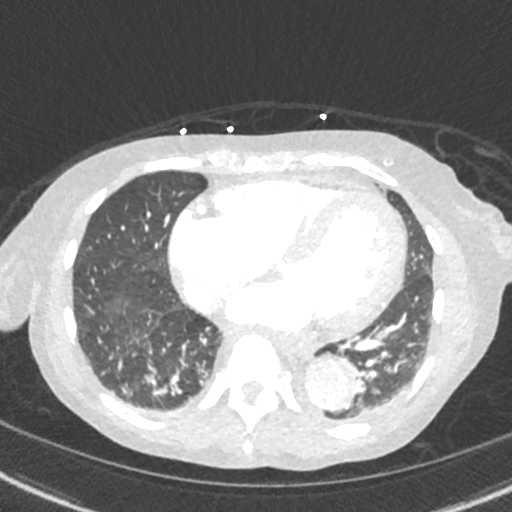
[im 134/343  soft-tissue]
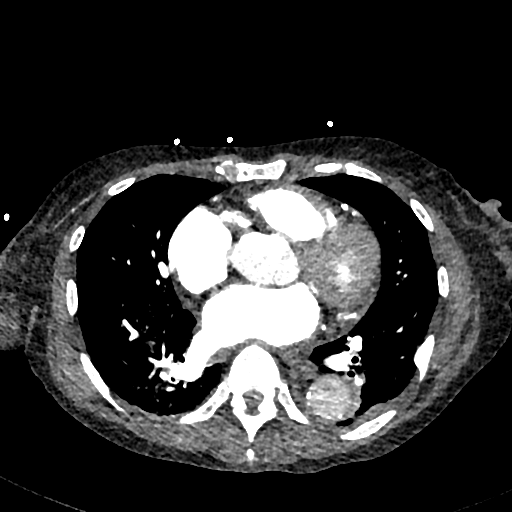
[im 149/343  lung]
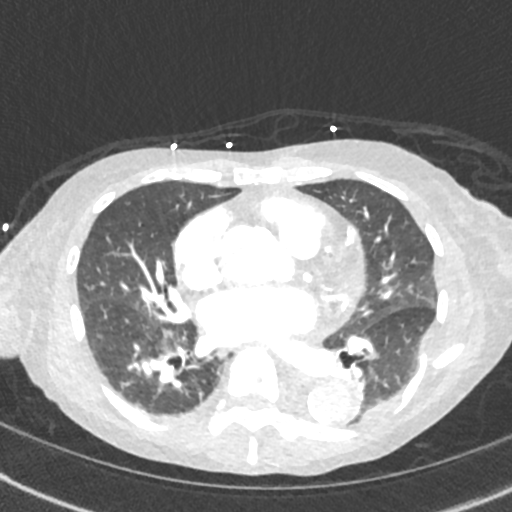
[im 179/343  soft-tissue]
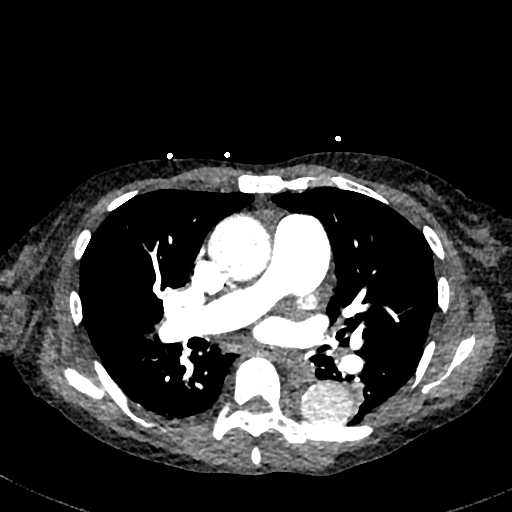
[im 194/343  lung]
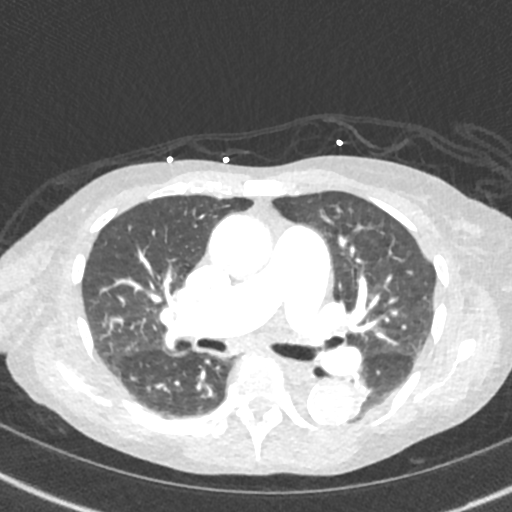
[im 209/343  soft-tissue]
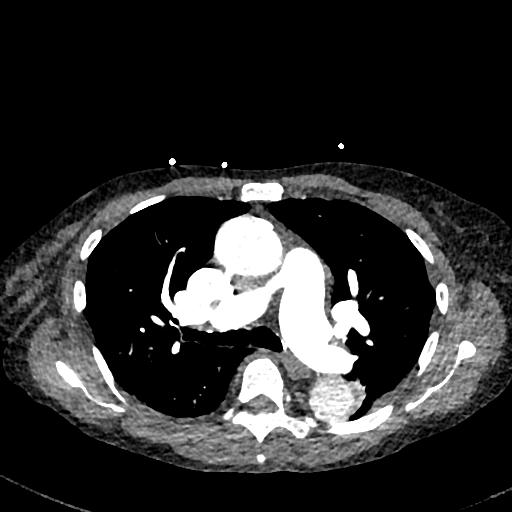
[im 238/343  lung]
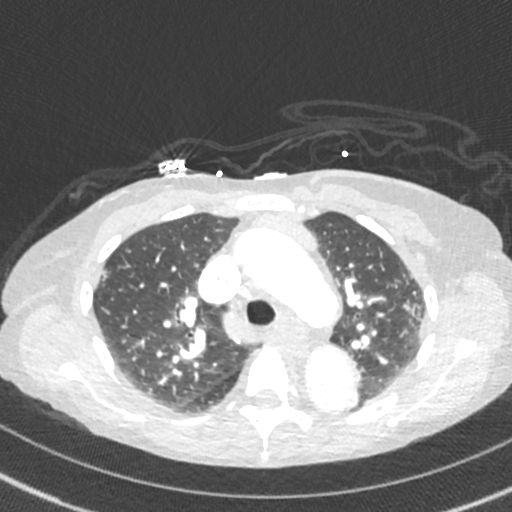
[im 253/343  soft-tissue]
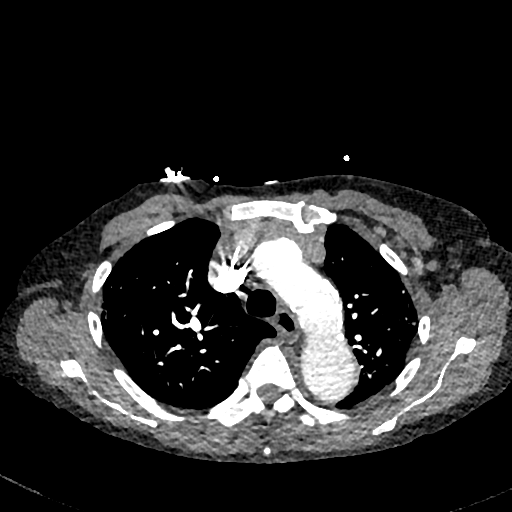
[im 283/343  lung]
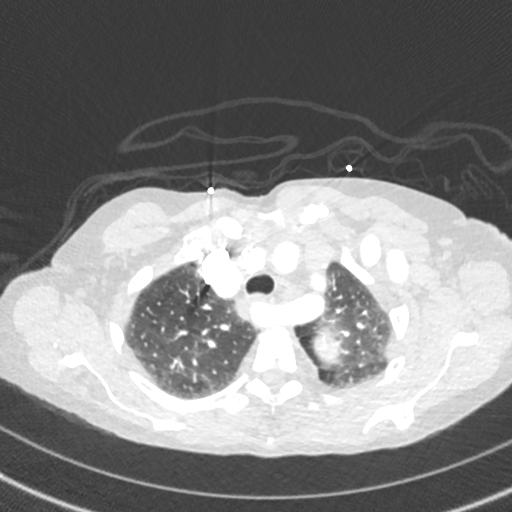
[im 298/343  soft-tissue]
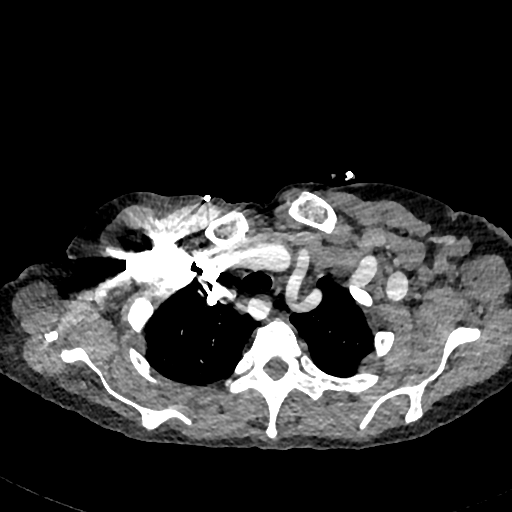
[im 328/343  lung]
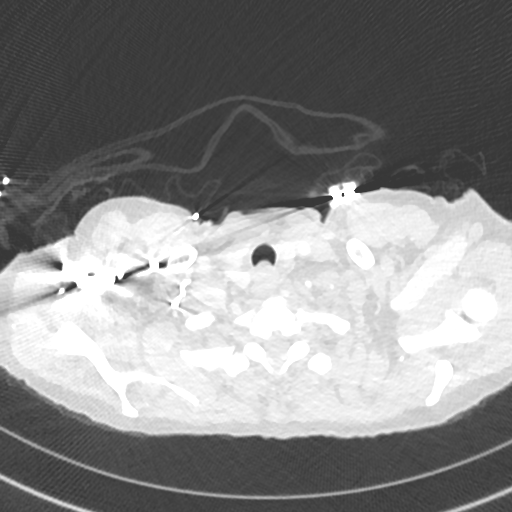

[Series 8: cor · coronal · 0.50mm/px · 3 of 117 slices shown]
[im 30/117  soft-tissue]
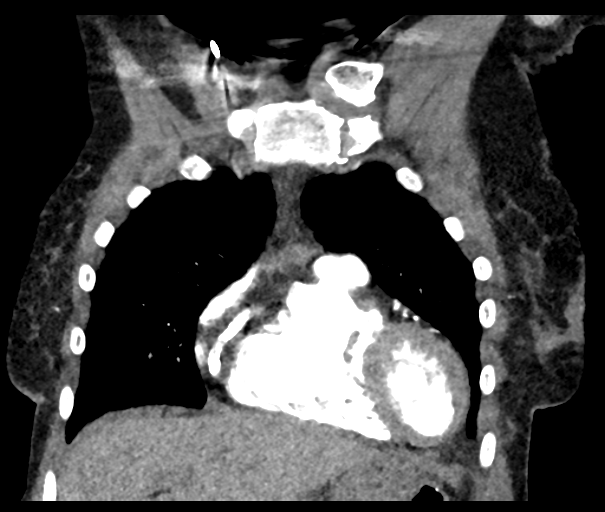
[im 59/117  soft-tissue]
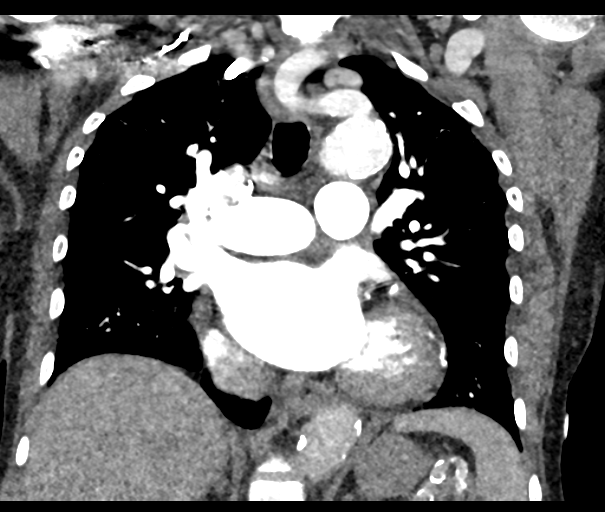
[im 88/117  soft-tissue]
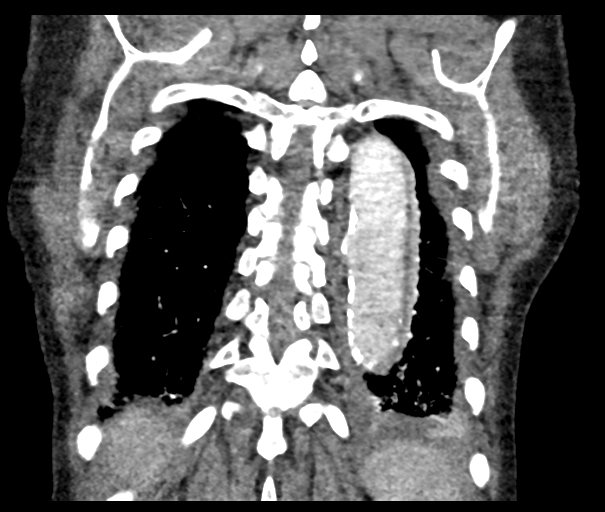

[18 of 46 positions shown; findings below may reference images not displayed]

FINDINGS: Cardiovascular: Contrast injection is sufficient to demonstrate
satisfactory opacification of the pulmonary arteries to the
segmental level. There is no pulmonary embolus. The main pulmonary
artery is mildly enlarged, measuring 3.4 cm at the bifurcation.
There is no CT evidence of acute right heart strain. There is
calcific atherosclerosis of the aorta. There is a normal variant
aortic arch branching pattern with the brachiocephalic and left
common carotid arteries sharing a common origin. There is a markedly
tortuous course of the left subclavian artery. This exerts mass
effect on the left aspect of the esophagus. Heart size is enlarged.
There are coronary artery calcifications..

Mediastinum/Nodes: Rightward deviation of the thoracic esophagus
secondary to mass effect by tortuous left subclavian artery. Normal
thyroid. No mediastinal lymphadenopathy. No axillary adenopathy.

Lungs/Pleura: Areas of dense peripheral tree-in-bud opacity are
noted in the right upper lobe and right lower lobe. To a lesser
extent, these are present in the left lung. There is a small left
pleural effusion. Bibasilar atelectasis. Central airways are clear.

Upper Abdomen: Contrast bolus timing is not optimized for evaluation
of the abdominal organs. Within this limitation, the visualized
organs of the upper abdomen are normal.

Musculoskeletal: No chest wall abnormality. No acute or significant
osseous findings.

Review of the MIP images confirms the above findings.
IMPRESSION: 1. No pulmonary embolus.
2. Enlarged main pulmonary artery, which may indicate underlying
pulmonary hypertension. Aortic Atherosclerosis ([id number]-[id number].A).
3. Multiple clusters of dense, nodular tree-in-bud opacities within
both lungs. Etiology is uncertain but may indicate silicosis or
other inhalational/hypersensitivity syndrome. Chronic infection,
such as with non-tuberculous mycobacteria, is another possibility.

## 2018-05-09 IMAGING — CR DG CHEST 2V
2 series · 2 of 2 positions shown · non-contrast
Comparison: 05/12/2017 chest radiograph.

CLINICAL DATA: Left rib pain.  No reported injury.

EXAM:
CHEST  2 VIEW

[chest lat]
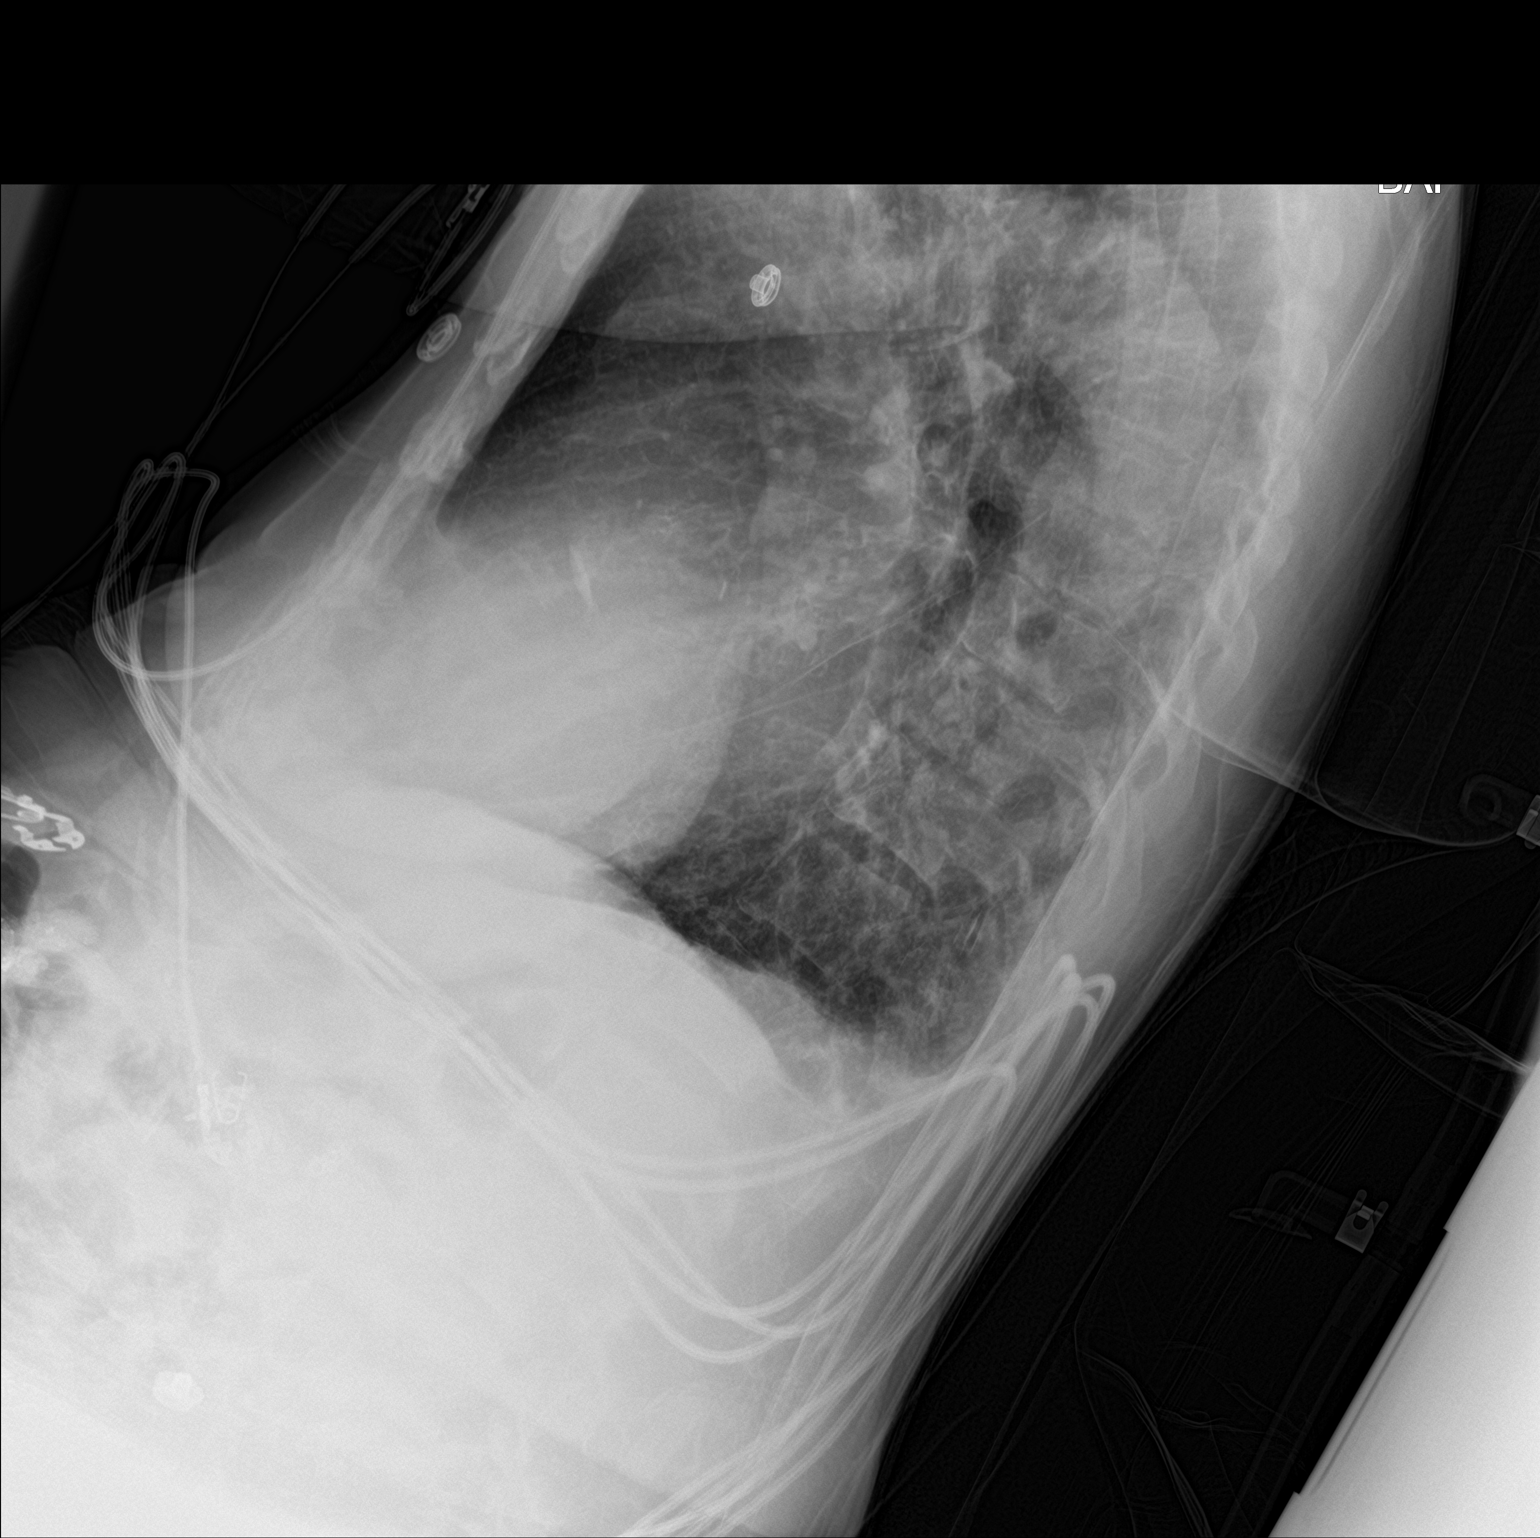

[chest ap]
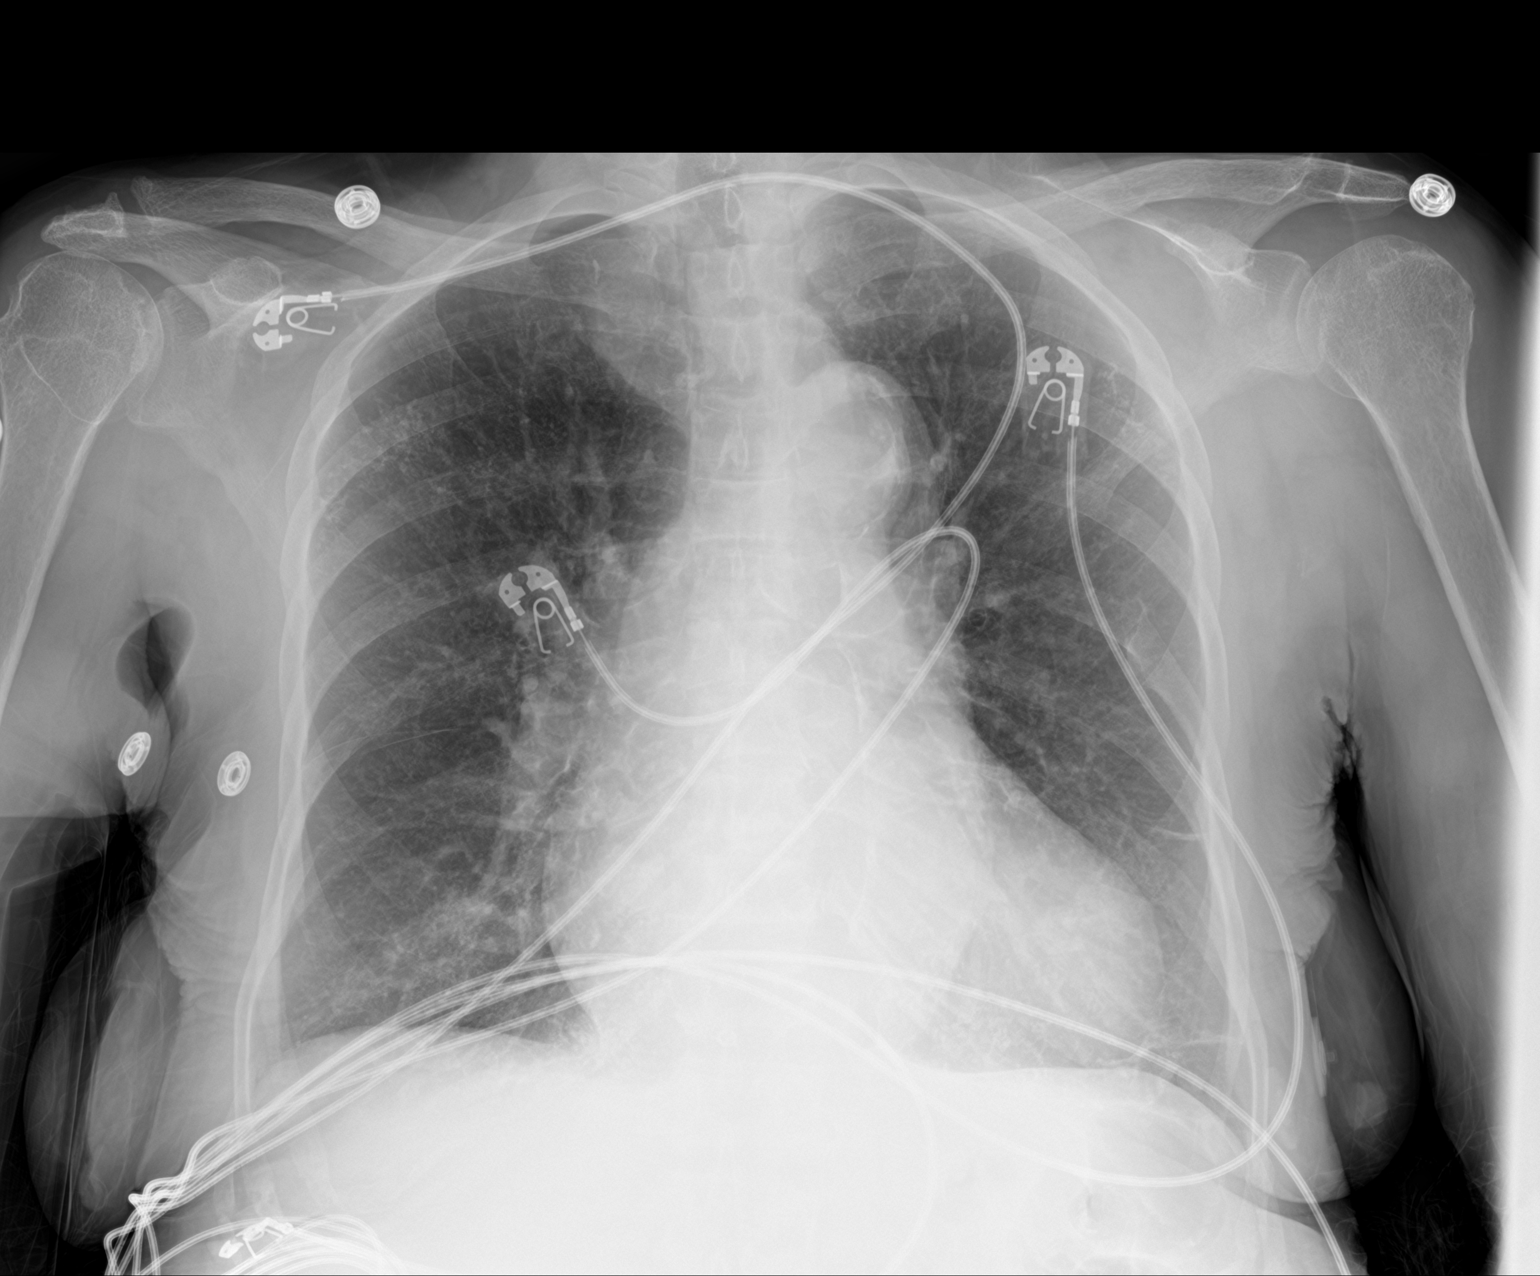

[2 of 2 positions shown; findings below may reference images not displayed]

FINDINGS: Stable cardiomediastinal silhouette with mild cardiomegaly and
mildly tortuous atherosclerotic thoracic aorta. No pneumothorax. No
significant pleural effusion. Mildly hyperinflated lungs, unchanged.
No overt pulmonary edema. Stable chronic postinflammatory
micronodularity in the upper lobes. Mild scarring at the left lung
base. No acute consolidative airspace disease. Healed left seventh
rib deformity. No acute displaced fractures in the visualized chest.
IMPRESSION: 1. Stable cardiomegaly without overt pulmonary edema.
2. Stable mildly hyperinflated lungs, suggesting chronic obstructive
lung disease.
3. No acute pulmonary disease.

## 2018-05-11 DIAGNOSIS — D631 Anemia in chronic kidney disease: Secondary | ICD-10-CM | POA: Diagnosis not present

## 2018-05-11 DIAGNOSIS — N2581 Secondary hyperparathyroidism of renal origin: Secondary | ICD-10-CM | POA: Diagnosis not present

## 2018-05-11 DIAGNOSIS — N186 End stage renal disease: Secondary | ICD-10-CM | POA: Diagnosis not present

## 2018-05-13 ENCOUNTER — Other Ambulatory Visit: Payer: Medicare Other

## 2018-05-13 ENCOUNTER — Ambulatory Visit
Admission: RE | Admit: 2018-05-13 | Discharge: 2018-05-13 | Disposition: A | Discharge status: Home / Self Care | Payer: Medicare Other | Source: Ambulatory Visit | Attending: Internal Medicine | Admitting: Internal Medicine

## 2018-05-13 DIAGNOSIS — R51 Headache: Secondary | ICD-10-CM | POA: Diagnosis not present

## 2018-05-13 DIAGNOSIS — N186 End stage renal disease: Secondary | ICD-10-CM | POA: Diagnosis not present

## 2018-05-13 DIAGNOSIS — R42 Dizziness and giddiness: Secondary | ICD-10-CM

## 2018-05-13 DIAGNOSIS — N2581 Secondary hyperparathyroidism of renal origin: Secondary | ICD-10-CM | POA: Diagnosis not present

## 2018-05-13 DIAGNOSIS — D631 Anemia in chronic kidney disease: Secondary | ICD-10-CM | POA: Diagnosis not present

## 2018-05-14 DIAGNOSIS — N186 End stage renal disease: Secondary | ICD-10-CM | POA: Diagnosis not present

## 2018-05-14 DIAGNOSIS — T8612 Kidney transplant failure: Secondary | ICD-10-CM | POA: Diagnosis not present

## 2018-05-14 DIAGNOSIS — Z992 Dependence on renal dialysis: Secondary | ICD-10-CM | POA: Diagnosis not present

## 2018-05-16 DIAGNOSIS — N2581 Secondary hyperparathyroidism of renal origin: Secondary | ICD-10-CM | POA: Diagnosis not present

## 2018-05-16 DIAGNOSIS — N186 End stage renal disease: Secondary | ICD-10-CM | POA: Diagnosis not present

## 2018-05-16 DIAGNOSIS — D631 Anemia in chronic kidney disease: Secondary | ICD-10-CM | POA: Diagnosis not present

## 2018-05-18 DIAGNOSIS — N186 End stage renal disease: Secondary | ICD-10-CM | POA: Diagnosis not present

## 2018-05-18 DIAGNOSIS — N2581 Secondary hyperparathyroidism of renal origin: Secondary | ICD-10-CM | POA: Diagnosis not present

## 2018-05-18 DIAGNOSIS — D631 Anemia in chronic kidney disease: Secondary | ICD-10-CM | POA: Diagnosis not present

## 2018-05-20 DIAGNOSIS — N186 End stage renal disease: Secondary | ICD-10-CM | POA: Diagnosis not present

## 2018-05-20 DIAGNOSIS — N2581 Secondary hyperparathyroidism of renal origin: Secondary | ICD-10-CM | POA: Diagnosis not present

## 2018-05-20 DIAGNOSIS — D631 Anemia in chronic kidney disease: Secondary | ICD-10-CM | POA: Diagnosis not present

## 2018-05-23 DIAGNOSIS — N2581 Secondary hyperparathyroidism of renal origin: Secondary | ICD-10-CM | POA: Diagnosis not present

## 2018-05-23 DIAGNOSIS — D631 Anemia in chronic kidney disease: Secondary | ICD-10-CM | POA: Diagnosis not present

## 2018-05-23 DIAGNOSIS — N186 End stage renal disease: Secondary | ICD-10-CM | POA: Diagnosis not present

## 2018-05-25 DIAGNOSIS — N186 End stage renal disease: Secondary | ICD-10-CM | POA: Diagnosis not present

## 2018-05-25 DIAGNOSIS — D631 Anemia in chronic kidney disease: Secondary | ICD-10-CM | POA: Diagnosis not present

## 2018-05-25 DIAGNOSIS — N2581 Secondary hyperparathyroidism of renal origin: Secondary | ICD-10-CM | POA: Diagnosis not present

## 2018-05-27 DIAGNOSIS — D631 Anemia in chronic kidney disease: Secondary | ICD-10-CM | POA: Diagnosis not present

## 2018-05-27 DIAGNOSIS — N2581 Secondary hyperparathyroidism of renal origin: Secondary | ICD-10-CM | POA: Diagnosis not present

## 2018-05-27 DIAGNOSIS — N186 End stage renal disease: Secondary | ICD-10-CM | POA: Diagnosis not present

## 2018-05-30 DIAGNOSIS — N186 End stage renal disease: Secondary | ICD-10-CM | POA: Diagnosis not present

## 2018-05-30 DIAGNOSIS — D631 Anemia in chronic kidney disease: Secondary | ICD-10-CM | POA: Diagnosis not present

## 2018-05-30 DIAGNOSIS — N2581 Secondary hyperparathyroidism of renal origin: Secondary | ICD-10-CM | POA: Diagnosis not present

## 2018-06-01 DIAGNOSIS — D631 Anemia in chronic kidney disease: Secondary | ICD-10-CM | POA: Diagnosis not present

## 2018-06-01 DIAGNOSIS — N186 End stage renal disease: Secondary | ICD-10-CM | POA: Diagnosis not present

## 2018-06-01 DIAGNOSIS — N2581 Secondary hyperparathyroidism of renal origin: Secondary | ICD-10-CM | POA: Diagnosis not present

## 2018-06-03 DIAGNOSIS — D631 Anemia in chronic kidney disease: Secondary | ICD-10-CM | POA: Diagnosis not present

## 2018-06-03 DIAGNOSIS — N2581 Secondary hyperparathyroidism of renal origin: Secondary | ICD-10-CM | POA: Diagnosis not present

## 2018-06-03 DIAGNOSIS — N186 End stage renal disease: Secondary | ICD-10-CM | POA: Diagnosis not present

## 2018-06-06 DIAGNOSIS — N2581 Secondary hyperparathyroidism of renal origin: Secondary | ICD-10-CM | POA: Diagnosis not present

## 2018-06-06 DIAGNOSIS — N186 End stage renal disease: Secondary | ICD-10-CM | POA: Diagnosis not present

## 2018-06-06 DIAGNOSIS — D631 Anemia in chronic kidney disease: Secondary | ICD-10-CM | POA: Diagnosis not present

## 2018-06-08 DIAGNOSIS — N186 End stage renal disease: Secondary | ICD-10-CM | POA: Diagnosis not present

## 2018-06-08 DIAGNOSIS — N2581 Secondary hyperparathyroidism of renal origin: Secondary | ICD-10-CM | POA: Diagnosis not present

## 2018-06-08 DIAGNOSIS — D631 Anemia in chronic kidney disease: Secondary | ICD-10-CM | POA: Diagnosis not present

## 2018-06-10 DIAGNOSIS — N186 End stage renal disease: Secondary | ICD-10-CM | POA: Diagnosis not present

## 2018-06-10 DIAGNOSIS — D631 Anemia in chronic kidney disease: Secondary | ICD-10-CM | POA: Diagnosis not present

## 2018-06-10 DIAGNOSIS — N2581 Secondary hyperparathyroidism of renal origin: Secondary | ICD-10-CM | POA: Diagnosis not present

## 2018-06-13 DIAGNOSIS — T8612 Kidney transplant failure: Secondary | ICD-10-CM | POA: Diagnosis not present

## 2018-06-13 DIAGNOSIS — N2581 Secondary hyperparathyroidism of renal origin: Secondary | ICD-10-CM | POA: Diagnosis not present

## 2018-06-13 DIAGNOSIS — D631 Anemia in chronic kidney disease: Secondary | ICD-10-CM | POA: Diagnosis not present

## 2018-06-13 DIAGNOSIS — N186 End stage renal disease: Secondary | ICD-10-CM | POA: Diagnosis not present

## 2018-06-13 DIAGNOSIS — Z992 Dependence on renal dialysis: Secondary | ICD-10-CM | POA: Diagnosis not present

## 2018-06-14 DIAGNOSIS — Z6823 Body mass index (BMI) 23.0-23.9, adult: Secondary | ICD-10-CM | POA: Diagnosis not present

## 2018-06-14 DIAGNOSIS — Z01419 Encounter for gynecological examination (general) (routine) without abnormal findings: Secondary | ICD-10-CM | POA: Diagnosis not present

## 2018-06-15 DIAGNOSIS — N2581 Secondary hyperparathyroidism of renal origin: Secondary | ICD-10-CM | POA: Diagnosis not present

## 2018-06-15 DIAGNOSIS — N186 End stage renal disease: Secondary | ICD-10-CM | POA: Diagnosis not present

## 2018-06-15 DIAGNOSIS — D631 Anemia in chronic kidney disease: Secondary | ICD-10-CM | POA: Diagnosis not present

## 2018-06-17 DIAGNOSIS — N186 End stage renal disease: Secondary | ICD-10-CM | POA: Diagnosis not present

## 2018-06-17 DIAGNOSIS — D631 Anemia in chronic kidney disease: Secondary | ICD-10-CM | POA: Diagnosis not present

## 2018-06-17 DIAGNOSIS — N2581 Secondary hyperparathyroidism of renal origin: Secondary | ICD-10-CM | POA: Diagnosis not present

## 2018-06-20 DIAGNOSIS — D631 Anemia in chronic kidney disease: Secondary | ICD-10-CM | POA: Diagnosis not present

## 2018-06-20 DIAGNOSIS — N186 End stage renal disease: Secondary | ICD-10-CM | POA: Diagnosis not present

## 2018-06-20 DIAGNOSIS — N2581 Secondary hyperparathyroidism of renal origin: Secondary | ICD-10-CM | POA: Diagnosis not present

## 2018-06-22 DIAGNOSIS — N186 End stage renal disease: Secondary | ICD-10-CM | POA: Diagnosis not present

## 2018-06-22 DIAGNOSIS — D631 Anemia in chronic kidney disease: Secondary | ICD-10-CM | POA: Diagnosis not present

## 2018-06-22 DIAGNOSIS — N2581 Secondary hyperparathyroidism of renal origin: Secondary | ICD-10-CM | POA: Diagnosis not present

## 2018-06-24 DIAGNOSIS — D631 Anemia in chronic kidney disease: Secondary | ICD-10-CM | POA: Diagnosis not present

## 2018-06-24 DIAGNOSIS — N186 End stage renal disease: Secondary | ICD-10-CM | POA: Diagnosis not present

## 2018-06-24 DIAGNOSIS — N2581 Secondary hyperparathyroidism of renal origin: Secondary | ICD-10-CM | POA: Diagnosis not present

## 2018-06-27 DIAGNOSIS — N186 End stage renal disease: Secondary | ICD-10-CM | POA: Diagnosis not present

## 2018-06-27 DIAGNOSIS — N2581 Secondary hyperparathyroidism of renal origin: Secondary | ICD-10-CM | POA: Diagnosis not present

## 2018-06-27 DIAGNOSIS — D631 Anemia in chronic kidney disease: Secondary | ICD-10-CM | POA: Diagnosis not present

## 2018-06-29 DIAGNOSIS — D631 Anemia in chronic kidney disease: Secondary | ICD-10-CM | POA: Diagnosis not present

## 2018-06-29 DIAGNOSIS — N186 End stage renal disease: Secondary | ICD-10-CM | POA: Diagnosis not present

## 2018-06-29 DIAGNOSIS — N2581 Secondary hyperparathyroidism of renal origin: Secondary | ICD-10-CM | POA: Diagnosis not present

## 2018-07-01 DIAGNOSIS — N186 End stage renal disease: Secondary | ICD-10-CM | POA: Diagnosis not present

## 2018-07-01 DIAGNOSIS — D631 Anemia in chronic kidney disease: Secondary | ICD-10-CM | POA: Diagnosis not present

## 2018-07-01 DIAGNOSIS — N2581 Secondary hyperparathyroidism of renal origin: Secondary | ICD-10-CM | POA: Diagnosis not present

## 2018-07-04 DIAGNOSIS — N186 End stage renal disease: Secondary | ICD-10-CM | POA: Diagnosis not present

## 2018-07-04 DIAGNOSIS — N2581 Secondary hyperparathyroidism of renal origin: Secondary | ICD-10-CM | POA: Diagnosis not present

## 2018-07-04 DIAGNOSIS — D631 Anemia in chronic kidney disease: Secondary | ICD-10-CM | POA: Diagnosis not present

## 2018-07-06 DIAGNOSIS — N186 End stage renal disease: Secondary | ICD-10-CM | POA: Diagnosis not present

## 2018-07-06 DIAGNOSIS — N2581 Secondary hyperparathyroidism of renal origin: Secondary | ICD-10-CM | POA: Diagnosis not present

## 2018-07-06 DIAGNOSIS — D631 Anemia in chronic kidney disease: Secondary | ICD-10-CM | POA: Diagnosis not present

## 2018-07-08 DIAGNOSIS — N2581 Secondary hyperparathyroidism of renal origin: Secondary | ICD-10-CM | POA: Diagnosis not present

## 2018-07-08 DIAGNOSIS — N186 End stage renal disease: Secondary | ICD-10-CM | POA: Diagnosis not present

## 2018-07-08 DIAGNOSIS — D631 Anemia in chronic kidney disease: Secondary | ICD-10-CM | POA: Diagnosis not present

## 2018-07-11 DIAGNOSIS — N186 End stage renal disease: Secondary | ICD-10-CM | POA: Diagnosis not present

## 2018-07-11 DIAGNOSIS — D631 Anemia in chronic kidney disease: Secondary | ICD-10-CM | POA: Diagnosis not present

## 2018-07-11 DIAGNOSIS — N2581 Secondary hyperparathyroidism of renal origin: Secondary | ICD-10-CM | POA: Diagnosis not present

## 2018-07-13 ENCOUNTER — Encounter: Payer: Self-pay | Admitting: Neurology

## 2018-07-13 ENCOUNTER — Ambulatory Visit (INDEPENDENT_AMBULATORY_CARE_PROVIDER_SITE_OTHER): Payer: Medicare Other | Admitting: Neurology

## 2018-07-13 VITALS — BP 143/76 | HR 83 | Wt 124.2 lb

## 2018-07-13 DIAGNOSIS — G9340 Encephalopathy, unspecified: Secondary | ICD-10-CM

## 2018-07-13 DIAGNOSIS — N2581 Secondary hyperparathyroidism of renal origin: Secondary | ICD-10-CM | POA: Diagnosis not present

## 2018-07-13 DIAGNOSIS — R413 Other amnesia: Secondary | ICD-10-CM

## 2018-07-13 DIAGNOSIS — R410 Disorientation, unspecified: Secondary | ICD-10-CM

## 2018-07-13 DIAGNOSIS — N186 End stage renal disease: Secondary | ICD-10-CM | POA: Diagnosis not present

## 2018-07-13 DIAGNOSIS — D631 Anemia in chronic kidney disease: Secondary | ICD-10-CM | POA: Diagnosis not present

## 2018-07-13 NOTE — Patient Instructions (Signed)
Labs MRI brain  Memory Compensation Strategies  1. Use "WARM" strategy.  W= write it down  A= associate it  R= repeat it  M= make a mental note  2.   You can keep a Social worker.  Use a 3-ring notebook with sections for the following: calendar, important names and phone numbers,  medications, doctors' names/phone numbers, lists/reminders, and a section to journal what you did  each day.   3.    Use a calendar to write appointments down.  4.    Write yourself a schedule for the day.  This can be placed on the calendar or in a separate section of the Memory Notebook.  Keeping a  regular schedule can help memory.  5.    Use medication organizer with sections for each day or morning/evening pills.  You may need help loading it  6.    Keep a basket, or pegboard by the door.  Place items that you need to take out with you in the basket or on the pegboard.  You may also want to  include a message board for reminders.  7.    Use sticky notes.  Place sticky notes with reminders in a place where the task is performed.  For example: " turn off the  stove" placed by the stove, "lock the door" placed on the door at eye level, " take your medications" on  the bathroom mirror or by the place where you normally take your medications.  8.    Use alarms/timers.  Use while cooking to remind yourself to check on food or as a reminder to take your medicine, or as a  reminder to make a call, or as a reminder to perform another task, etc.

## 2018-07-13 NOTE — Progress Notes (Signed)
[facility]    Provider:  Dr Jaynee Eagles Referring Provider: [name], MD Primary Care Physician:  [name], MD  CC:  Memory loss  HPI:  [name] is a 67 y.o. female here as a referral from Dr. Delfina Redwood for new problem memory loss. PMHx . Since her stroke she feels like her memory is impaired. Her mother has dementia and she is caring for her so there is stress. She cares for her mother. She has difficulty with long-term memory, trying to remember things that used to be. She says her mother gets on her nerves and makes her forget things, she talks awful to her. Doesn't have any problems with year or month but may have difficulty with day. No repeating things. She is caring for the household, cleaning, shopping. She does not miss medications and she manage mom's medications. Her son pays bills. She drive, no accidents, no accidents in the home. She forgets what day it is. Mom with dementia unknown what kind. Progressively worsening. She has personality changes, aggravated, saying things that she wouldn't, agitation.   Reviewed notes, labs and imaging from outside physicians, which showed:  Reviewed CT of the head images and agree:  1. Scattered white matter changes. No acute intracranial abnormality. 2. Widening of the pre odontoid space and malalignment of the first and second spinal laminal lines, unchanged since 2015. This is not an acute finding and the patient reports no trauma. Recommend clinical correlation. Dedicated CT imaging of the cervical spine could of course better evaluate if clinically warranted.  Review of Systems: Patient complains of symptoms per HPI as well as the following symptoms: memory loss. Pertinent negatives and positives per HPI. All others negative.   Social History   Socioeconomic History  . Marital status: Widowed    Spouse name: Not on file  . Number of children: 2  . Years of education: Not on file  . Highest education  level: Not on file  Occupational History  . Not on file  Social Needs  . Financial resource strain: Not on file  . Food insecurity:    Worry: Not on file    Inability: Not on file  . Transportation needs:    Medical: Not on file    Non-medical: Not on file  Tobacco Use  . Smoking status: Former Smoker    Types: Cigarettes    Last attempt to quit: 12/31/1991    Years since quitting: 26.5  . Smokeless tobacco: Never Used  Substance and Sexual Activity  . Alcohol use: No    Alcohol/week: 0.0 oz  . Drug use: No  . Sexual activity: Never  Lifestyle  . Physical activity:    Days per week: Not on file    Minutes per session: Not on file  . Stress: Not on file  Relationships  . Social connections:    Talks on phone: Not on file    Gets together: Not on file    Attends religious service: Not on file    Active member of club or organization: Not on file    Attends meetings of clubs or organizations: Not on file    Relationship status: Not on file  . Intimate partner violence:    Fear of current or ex partner: Not on file    Emotionally abused: Not on file    Physically abused: Not on file    Forced sexual activity: Not on file  Other Topics Concern  . Not on file  Social History Narrative  . Not on file    Family History  Problem Relation Age of Onset  . Colon cancer Brother   . Cancer Brother   . Coronary artery disease Mother 32  . Hyperlipidemia Mother   . Hypertension Mother   . Stroke Maternal Aunt   . Esophageal cancer Neg Hx   . Stomach cancer Neg Hx   . Rectal cancer Neg Hx     Past Medical History:  Diagnosis Date  . Adenomatous polyp of colon 10/2010, 2006, 2015  . Anemia in CKD (chronic kidney disease) 11/07/2012   s/p blood transfusion.   . Arthritis   . CHF (congestive heart failure) ([place])   . Constipation   . Depression with anxiety   . Dialysis patient ([place])    Mon-Wed-Fri  . Diverticula, colon   . 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  Had deceased donor kidney transplant in 1996.  Had some early rejection then stable function for years, then had slow decline of function and went back on hemodialysis in 2012.  Gets HD TTS schedule at [facility] on [facility] still using L forearm AVF.     Marland Kitchen GERD (gastroesophageal reflux disease)   . Headache   . Hyperlipidemia   . Hypertension   . Neuromuscular disorder (HCC)    neuropathy hand and legs  . Osteoporosis   . Pneumonia   . 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 Stroke [facility]) 11/2015   TIA  . Weight loss, unintentional     Past Surgical History:  Procedure Laterality Date  . AV FISTULA PLACEMENT     for dialysis  . AV FISTULA PLACEMENT Left 11/22/2015   Procedure: ARTERIOVENOUS (AV) FISTULA CREATION-LEFT BRACHIOCEPHALIC;  Surgeon: Serafina Mitchell, MD;  Location: [place];  Service: Vascular;  Laterality: Left;  . BACK SURGERY    . CERVICAL FUSION    . CHOLECYSTECTOMY  12/02/2012   Procedure: LAPAROSCOPIC CHOLECYSTECTOMY WITH INTRAOPERATIVE CHOLANGIOGRAM;  Surgeon: Edward Jolly, MD;  Location: [place];  Service: General;  Laterality: N/A;  . EYE SURGERY Bilateral    cataract surgery  . EYE SURGERY Left 2019   laser  . HEMATOMA EVACUATION Left 12/24/2016   Procedure: EVACUATION HEMATOMA LEFT UPPER ARM;  Surgeon: Waynetta Sandy, MD;  Location: [place];  Service: Vascular;  Laterality: Left;  . I&D EXTREMITY Left 12/31/2016   Procedure: IRRIGATION AND DEBRIDEMENT EXTREMITY;  Surgeon: Angelia Mould, MD;  Location: [place];  Service: Vascular;  Laterality: Left;  . INSERTION OF DIALYSIS CATHETER Right 12/24/2016   Procedure: INSERTION OF DIALYSIS CATHETER;  Surgeon: Waynetta Sandy, MD;  Location: [place];  Service: Vascular;  Laterality: Right;  . INSERTION OF DIALYSIS CATHETER Right 02/04/2017   Procedure: INSERTION OF DIALYSIS CATHETER;   Surgeon: Waynetta Sandy, MD;  Location: [place];  Service: Vascular;  Laterality: Right;  . KIDNEY TRANSPLANT  1996  . PERIPHERAL VASCULAR CATHETERIZATION Left 10/23/2016   Procedure: Fistulagram;  Surgeon: Elam Dutch, MD;  Location: [place] [facility];  Service: Cardiovascular;  Laterality: Left;  . RESECTION OF ARTERIOVENOUS FISTULA ANEURYSM Left 11/22/2015   Procedure: RESECTION OF LEFT RADIOCEPHALIC FISTULA ANEURYSM ;  Surgeon: Serafina Mitchell, MD;  Location: [place];  Service: Vascular;  Laterality: Left;  . REVISON OF ARTERIOVENOUS FISTULA Left 12/22/2016   Procedure: REVISON  OF LEFT ARTERIOVENOUS FISTULA;  Surgeon: Waynetta Sandy, MD;  Location: [place];  Service: Vascular;  Laterality: Left;  . REVISON OF ARTERIOVENOUS FISTULA Left 02/04/2017   Procedure: REVISON OF LEFT UPPER ARM ARTERIOVENOUS FISTULA;  Surgeon: Waynetta Sandy, MD;  Location: [place];  Service: Vascular;  Laterality: Left;    Current Outpatient Medications  Medication Sig Dispense Refill  . acetaminophen (TYLENOL) 325 MG tablet Take 650 mg by mouth every 6 (six) hours as needed.    Marland Kitchen allopurinol (ZYLOPRIM) 300 MG tablet Take 150 mg by mouth at bedtime.    . ALPRAZolam (XANAX) 0.25 MG tablet Take 0.25 mg by mouth 2 (two) times daily as needed for anxiety.     Marland Kitchen amLODipine (NORVASC) 10 MG tablet 10 mg at bedtime.  3  . B Complex-C-Folic Acid (DIALYVITE PO) Take 1 tablet by mouth daily.    . clopidogrel (PLAVIX) 75 MG tablet Take 1 tablet (75 mg total) by mouth daily. 30 tablet 0  . diclofenac sodium (VOLTAREN) 1 % GEL Apply 2 g topically 4 (four) times daily. 100 g 0  . doxazosin (CARDURA) 8 MG tablet Take 1 tablet (8 mg total) by mouth at bedtime.    Marland Kitchen HYDROcodone-acetaminophen (NORCO/VICODIN) 5-325 MG tablet Take 1-2 tablets by mouth every 6 hours as needed for pain and/or cough. 7 tablet 0  . labetalol (NORMODYNE) 200 MG tablet Take 200 mg by mouth 2 (two) times daily.     Marland Kitchen lactulose  (CHRONULAC) 10 GM/15ML solution Take 20 g by mouth daily as needed for mild constipation.    Marland Kitchen lanthanum (FOSRENOL) 1000 MG chewable tablet Chew 1,000 mg by mouth 2 (two) times daily with a meal.    . losartan (COZAAR) 100 MG tablet Take 100 mg by mouth at bedtime.     . meclizine (ANTIVERT) 25 MG tablet Take 1 tablet (25 mg total) by mouth 2 (two) times daily as needed for dizziness. 30 tablet 0  . ondansetron (ZOFRAN) 8 MG tablet TK 1 T PO  BID FOR 4 DAYS  0  . SENSIPAR 30 MG tablet Take 30 mg by mouth every Monday, Wednesday, and Friday with hemodialysis.     Marland Kitchen simvastatin (ZOCOR) 20 MG tablet Take 1 tablet (20 mg total) by mouth at bedtime. (Patient taking differently: Take 10 mg by mouth at bedtime. ) 30 tablet 0  . zolpidem (AMBIEN) 10 MG tablet take 10 mg in the evening     No current facility-administered medications for this visit.     Allergies as of 07/13/2018 - Review Complete 07/13/2018  Allergen Reaction Noted  . Sulfa antibiotics Other (See Comments) 11/06/2012  . Adhesive [tape] Itching 12/29/2016    Vitals: BP (!) 143/76 (BP Location: Right Arm, Patient Position: Sitting, Cuff Size: Small)   Pulse 83   Wt 124 lb 3.2 oz (56.3 kg)   BMI 22.72 kg/m  Last Weight:  Wt Readings from Last 1 Encounters:  07/13/18 124 lb 3.2 oz (56.3 kg)   Last Height:   Ht Readings from Last 1 Encounters:  05/04/17 5\' 2"  (1.575 m)   Physical exam: Exam: Gen: NAD CV: RRR, no MRG. No Carotid Bruits. No peripheral edema, warm, nontender Eyes: Conjunctivae clear without exudates or hemorrhage  Neuro: Detailed Neurologic Exam  Speech:    Speech is normal; fluent and spontaneous with normal comprehension.  Cognition:  MMSE - Mini Mental State Exam 07/13/2018  Orientation to time 3  Orientation to Place 3  Registration  3  Attention/ Calculation 1  Recall 2  Language- name 2 objects 2  Language- repeat 1  Language- follow 3 step command 3  Language- read & follow direction 1    Write a sentence 1  Copy design 1  Total score 21      The patient is oriented to person, place, and time;     recent and remote memory impaired;     language fluent;     Impaired attention, concentration, fund of knowledge Cranial Nerves:    The pupils are equal, round, and reactive to light. Attempted fundosopic exam could not visualize. Visual fields are full to finger confrontation. Extraocular movements are intact. Trigeminal sensation is intact and the muscles of mastication are normal. The face is symmetric. The palate elevates in the midline. Hearing intact. Voice is normal. Shoulder shrug is normal. The tongue has normal motion without fasciculations.   Gait:    No ataxia  Motor Observation:    No asymmetry, no atrophy, and no involuntary movements noted. Tone:    Normal muscle tone.    Posture:    Posture is normal. normal erect    Strength:    Very poor effort but strength appears symmetric     Sensation: intact to LT     Reflex Exam:  DTR's:    Deep tendon reflexes in the upper and lower extremities are symmetric bilaterally.   Toes:    The toes are downgoing bilaterally.   Clonus:    Clonus is absent.       Assessment/Plan:   67 year old here for memory changes in the setting of extreme stress and caring for her mother with dementia. PMHx stroke, HTN, HLD, headache, depression with anxiety.  - Memory complaints in the setting of extreme stress and caring for mother with dementia. 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 Return to pcp for treatment of mood disorder and needs therapy - will order MRI of the brain and labs today - can consider FDG PET Scan in the future - Discussed memory compensation strategies.  Orders Placed This Encounter  Procedures  . MR BRAIN W WO CONTRAST  . TSH  . RPR  . B12 and Folate Panel  . Methylmalonic acid, serum  . CBC  . Comprehensive metabolic panel   I had a d/w patient about her recent stroke,  risk for recurrent stroke/TIAs, personally independently reviewed imaging studies and stroke evaluation results and answered questions.Continue Plavix for secondary stroke prevention and maintain strict control of hypertension with blood pressure goal below 130/90, diabetes with hemoglobin A1c goal below 6.5% and lipids with LDL cholesterol goal below 70 mg/dL. I also advised the patient to eat a healthy diet with plenty of whole grains, cereals, fruits and vegetables, exercise regularly and maintain ideal body weight .Discussed stroke prevention.   Sarina Ill, MD  [facility] [address] [place], [place] [address]  Phone [phone] Fax [phone]

## 2018-07-14 ENCOUNTER — Telehealth: Payer: Self-pay | Admitting: *Deleted

## 2018-07-14 DIAGNOSIS — Z992 Dependence on renal dialysis: Secondary | ICD-10-CM | POA: Diagnosis not present

## 2018-07-14 DIAGNOSIS — T8612 Kidney transplant failure: Secondary | ICD-10-CM | POA: Diagnosis not present

## 2018-07-14 DIAGNOSIS — N186 End stage renal disease: Secondary | ICD-10-CM | POA: Diagnosis not present

## 2018-07-14 NOTE — Telephone Encounter (Addendum)
Called patient and discussed lab results, TSH & B12 normal. She has CKD and anemia however these are chronic, nothing new. Pt verbalized understanding and appreciation.    ----- Message from Melvenia Beam, MD sent at 07/14/2018 10:09 AM EDT ----- Tsh, b12 normal. She has CKD (on dialysis) and anemia however these are chronic, nothing new.  thanks

## 2018-07-15 DIAGNOSIS — N2581 Secondary hyperparathyroidism of renal origin: Secondary | ICD-10-CM | POA: Diagnosis not present

## 2018-07-15 DIAGNOSIS — D509 Iron deficiency anemia, unspecified: Secondary | ICD-10-CM | POA: Diagnosis not present

## 2018-07-15 DIAGNOSIS — N186 End stage renal disease: Secondary | ICD-10-CM | POA: Diagnosis not present

## 2018-07-15 DIAGNOSIS — D631 Anemia in chronic kidney disease: Secondary | ICD-10-CM | POA: Diagnosis not present

## 2018-07-15 LAB — METHYLMALONIC ACID, SERUM: METHYLMALONIC ACID: 1572 nmol/L — AB (ref 0–378)

## 2018-07-15 LAB — COMPREHENSIVE METABOLIC PANEL
ALK PHOS: 91 IU/L (ref 39–117)
ALT: 7 IU/L (ref 0–32)
AST: 17 IU/L (ref 0–40)
Albumin/Globulin Ratio: 1.5 (ref 1.2–2.2)
Albumin: 4.1 g/dL (ref 3.6–4.8)
BUN/Creatinine Ratio: 5 — ABNORMAL LOW (ref 12–28)
BUN: 45 mg/dL — ABNORMAL HIGH (ref 8–27)
Bilirubin Total: 0.4 mg/dL (ref 0.0–1.2)
CHLORIDE: 92 mmol/L — AB (ref 96–106)
CO2: 24 mmol/L (ref 20–29)
Calcium: 10.1 mg/dL (ref 8.7–10.3)
Creatinine, Ser: 8.41 mg/dL — ABNORMAL HIGH (ref 0.57–1.00)
GFR calc Af Amer: 5 mL/min/{1.73_m2} — ABNORMAL LOW (ref >59)
GFR calc non Af Amer: 4 mL/min/{1.73_m2} — ABNORMAL LOW (ref >59)
GLUCOSE: 73 mg/dL (ref 65–99)
Globulin, Total: 2.8 g/dL (ref 1.5–4.5)
POTASSIUM: 4.9 mmol/L (ref 3.5–5.2)
Sodium: 140 mmol/L (ref 134–144)
Total Protein: 6.9 g/dL (ref 6.0–8.5)

## 2018-07-15 LAB — CBC
HEMOGLOBIN: 10.6 g/dL — AB (ref 11.1–15.9)
Hematocrit: 33 % — ABNORMAL LOW (ref 34.0–46.6)
MCH: 29.2 pg (ref 26.6–33.0)
MCHC: 32.1 g/dL (ref 31.5–35.7)
MCV: 91 fL (ref 79–97)
PLATELETS: 203 10*3/uL (ref 150–450)
RBC: 3.63 x10E6/uL — ABNORMAL LOW (ref 3.77–5.28)
RDW: 16.9 % — AB (ref 12.3–15.4)
WBC: 6.6 10*3/uL (ref 3.4–10.8)

## 2018-07-15 LAB — TSH: TSH: 3.6 u[IU]/mL (ref 0.450–4.500)

## 2018-07-15 LAB — B12 AND FOLATE PANEL
FOLATE: 18.3 ng/mL (ref >3.0)
VITAMIN B 12: 877 pg/mL (ref 232–1245)

## 2018-07-15 LAB — RPR: RPR: NONREACTIVE

## 2018-07-18 DIAGNOSIS — N186 End stage renal disease: Secondary | ICD-10-CM | POA: Diagnosis not present

## 2018-07-18 DIAGNOSIS — D631 Anemia in chronic kidney disease: Secondary | ICD-10-CM | POA: Diagnosis not present

## 2018-07-18 DIAGNOSIS — D509 Iron deficiency anemia, unspecified: Secondary | ICD-10-CM | POA: Diagnosis not present

## 2018-07-18 DIAGNOSIS — N2581 Secondary hyperparathyroidism of renal origin: Secondary | ICD-10-CM | POA: Diagnosis not present

## 2018-07-20 DIAGNOSIS — D509 Iron deficiency anemia, unspecified: Secondary | ICD-10-CM | POA: Diagnosis not present

## 2018-07-20 DIAGNOSIS — N186 End stage renal disease: Secondary | ICD-10-CM | POA: Diagnosis not present

## 2018-07-20 DIAGNOSIS — D631 Anemia in chronic kidney disease: Secondary | ICD-10-CM | POA: Diagnosis not present

## 2018-07-20 DIAGNOSIS — N2581 Secondary hyperparathyroidism of renal origin: Secondary | ICD-10-CM | POA: Diagnosis not present

## 2018-07-22 DIAGNOSIS — D631 Anemia in chronic kidney disease: Secondary | ICD-10-CM | POA: Diagnosis not present

## 2018-07-22 DIAGNOSIS — N2581 Secondary hyperparathyroidism of renal origin: Secondary | ICD-10-CM | POA: Diagnosis not present

## 2018-07-22 DIAGNOSIS — N186 End stage renal disease: Secondary | ICD-10-CM | POA: Diagnosis not present

## 2018-07-22 DIAGNOSIS — D509 Iron deficiency anemia, unspecified: Secondary | ICD-10-CM | POA: Diagnosis not present

## 2018-07-25 DIAGNOSIS — D509 Iron deficiency anemia, unspecified: Secondary | ICD-10-CM | POA: Diagnosis not present

## 2018-07-25 DIAGNOSIS — N186 End stage renal disease: Secondary | ICD-10-CM | POA: Diagnosis not present

## 2018-07-25 DIAGNOSIS — N2581 Secondary hyperparathyroidism of renal origin: Secondary | ICD-10-CM | POA: Diagnosis not present

## 2018-07-25 DIAGNOSIS — D631 Anemia in chronic kidney disease: Secondary | ICD-10-CM | POA: Diagnosis not present

## 2018-07-27 DIAGNOSIS — N2581 Secondary hyperparathyroidism of renal origin: Secondary | ICD-10-CM | POA: Diagnosis not present

## 2018-07-27 DIAGNOSIS — N186 End stage renal disease: Secondary | ICD-10-CM | POA: Diagnosis not present

## 2018-07-27 DIAGNOSIS — D509 Iron deficiency anemia, unspecified: Secondary | ICD-10-CM | POA: Diagnosis not present

## 2018-07-27 DIAGNOSIS — D631 Anemia in chronic kidney disease: Secondary | ICD-10-CM | POA: Diagnosis not present

## 2018-07-29 ENCOUNTER — Other Ambulatory Visit: Payer: Self-pay | Admitting: Neurology

## 2018-07-29 ENCOUNTER — Telehealth: Payer: Self-pay | Admitting: Neurology

## 2018-07-29 DIAGNOSIS — D631 Anemia in chronic kidney disease: Secondary | ICD-10-CM | POA: Diagnosis not present

## 2018-07-29 DIAGNOSIS — N2581 Secondary hyperparathyroidism of renal origin: Secondary | ICD-10-CM | POA: Diagnosis not present

## 2018-07-29 DIAGNOSIS — D509 Iron deficiency anemia, unspecified: Secondary | ICD-10-CM | POA: Diagnosis not present

## 2018-07-29 DIAGNOSIS — N186 End stage renal disease: Secondary | ICD-10-CM | POA: Diagnosis not present

## 2018-07-29 NOTE — Telephone Encounter (Signed)
Done. thanks

## 2018-07-29 NOTE — Addendum Note (Signed)
Addended by: Sarina Ill B on: 07/29/2018 11:22 AM   Modules accepted: Orders

## 2018-07-29 NOTE — Telephone Encounter (Signed)
[facility] called stating they are needing the pts MRI order changed. Stating the pt can not have contrast. Please change and resend.

## 2018-08-01 ENCOUNTER — Telehealth: Payer: Self-pay | Admitting: Neurology

## 2018-08-01 DIAGNOSIS — D509 Iron deficiency anemia, unspecified: Secondary | ICD-10-CM | POA: Diagnosis not present

## 2018-08-01 DIAGNOSIS — N2581 Secondary hyperparathyroidism of renal origin: Secondary | ICD-10-CM | POA: Diagnosis not present

## 2018-08-01 DIAGNOSIS — D631 Anemia in chronic kidney disease: Secondary | ICD-10-CM | POA: Diagnosis not present

## 2018-08-01 DIAGNOSIS — N186 End stage renal disease: Secondary | ICD-10-CM | POA: Diagnosis not present

## 2018-08-01 NOTE — Telephone Encounter (Signed)
Medicare/aarp order sent to [facility]. They will reach out to the pt to schedule.

## 2018-08-01 NOTE — Telephone Encounter (Signed)
Noted, I sent the order to GI.

## 2018-08-03 DIAGNOSIS — D509 Iron deficiency anemia, unspecified: Secondary | ICD-10-CM | POA: Diagnosis not present

## 2018-08-03 DIAGNOSIS — D631 Anemia in chronic kidney disease: Secondary | ICD-10-CM | POA: Diagnosis not present

## 2018-08-03 DIAGNOSIS — N2581 Secondary hyperparathyroidism of renal origin: Secondary | ICD-10-CM | POA: Diagnosis not present

## 2018-08-03 DIAGNOSIS — N186 End stage renal disease: Secondary | ICD-10-CM | POA: Diagnosis not present

## 2018-08-05 DIAGNOSIS — N186 End stage renal disease: Secondary | ICD-10-CM | POA: Diagnosis not present

## 2018-08-05 DIAGNOSIS — D509 Iron deficiency anemia, unspecified: Secondary | ICD-10-CM | POA: Diagnosis not present

## 2018-08-05 DIAGNOSIS — N2581 Secondary hyperparathyroidism of renal origin: Secondary | ICD-10-CM | POA: Diagnosis not present

## 2018-08-05 DIAGNOSIS — D631 Anemia in chronic kidney disease: Secondary | ICD-10-CM | POA: Diagnosis not present

## 2018-08-07 ENCOUNTER — Ambulatory Visit
Admission: RE | Admit: 2018-08-07 | Discharge: 2018-08-07 | Disposition: A | Discharge status: Home / Self Care | Payer: Medicare Other | Source: Ambulatory Visit | Attending: Neurology | Admitting: Neurology

## 2018-08-07 DIAGNOSIS — R413 Other amnesia: Secondary | ICD-10-CM

## 2018-08-07 DIAGNOSIS — G9340 Encephalopathy, unspecified: Secondary | ICD-10-CM | POA: Diagnosis not present

## 2018-08-07 DIAGNOSIS — R410 Disorientation, unspecified: Secondary | ICD-10-CM

## 2018-08-08 DIAGNOSIS — N2581 Secondary hyperparathyroidism of renal origin: Secondary | ICD-10-CM | POA: Diagnosis not present

## 2018-08-08 DIAGNOSIS — D509 Iron deficiency anemia, unspecified: Secondary | ICD-10-CM | POA: Diagnosis not present

## 2018-08-08 DIAGNOSIS — D631 Anemia in chronic kidney disease: Secondary | ICD-10-CM | POA: Diagnosis not present

## 2018-08-08 DIAGNOSIS — N186 End stage renal disease: Secondary | ICD-10-CM | POA: Diagnosis not present

## 2018-08-10 DIAGNOSIS — D509 Iron deficiency anemia, unspecified: Secondary | ICD-10-CM | POA: Diagnosis not present

## 2018-08-10 DIAGNOSIS — D631 Anemia in chronic kidney disease: Secondary | ICD-10-CM | POA: Diagnosis not present

## 2018-08-10 DIAGNOSIS — N2581 Secondary hyperparathyroidism of renal origin: Secondary | ICD-10-CM | POA: Diagnosis not present

## 2018-08-10 DIAGNOSIS — N186 End stage renal disease: Secondary | ICD-10-CM | POA: Diagnosis not present

## 2018-08-12 DIAGNOSIS — N186 End stage renal disease: Secondary | ICD-10-CM | POA: Diagnosis not present

## 2018-08-12 DIAGNOSIS — D631 Anemia in chronic kidney disease: Secondary | ICD-10-CM | POA: Diagnosis not present

## 2018-08-12 DIAGNOSIS — D509 Iron deficiency anemia, unspecified: Secondary | ICD-10-CM | POA: Diagnosis not present

## 2018-08-12 DIAGNOSIS — N2581 Secondary hyperparathyroidism of renal origin: Secondary | ICD-10-CM | POA: Diagnosis not present

## 2018-08-14 DIAGNOSIS — N186 End stage renal disease: Secondary | ICD-10-CM | POA: Diagnosis not present

## 2018-08-14 DIAGNOSIS — Z992 Dependence on renal dialysis: Secondary | ICD-10-CM | POA: Diagnosis not present

## 2018-08-14 DIAGNOSIS — T8612 Kidney transplant failure: Secondary | ICD-10-CM | POA: Diagnosis not present

## 2018-08-15 DIAGNOSIS — D631 Anemia in chronic kidney disease: Secondary | ICD-10-CM | POA: Diagnosis not present

## 2018-08-15 DIAGNOSIS — N2581 Secondary hyperparathyroidism of renal origin: Secondary | ICD-10-CM | POA: Diagnosis not present

## 2018-08-15 DIAGNOSIS — N186 End stage renal disease: Secondary | ICD-10-CM | POA: Diagnosis not present

## 2018-08-17 ENCOUNTER — Telehealth: Payer: Self-pay | Admitting: *Deleted

## 2018-08-17 DIAGNOSIS — N2581 Secondary hyperparathyroidism of renal origin: Secondary | ICD-10-CM | POA: Diagnosis not present

## 2018-08-17 DIAGNOSIS — D631 Anemia in chronic kidney disease: Secondary | ICD-10-CM | POA: Diagnosis not present

## 2018-08-17 DIAGNOSIS — N186 End stage renal disease: Secondary | ICD-10-CM | POA: Diagnosis not present

## 2018-08-17 NOTE — Telephone Encounter (Signed)
-----   Message from Melvenia Beam, MD sent at 08/15/2018  6:56 PM EDT ----- There have been no changes to her brain since 2018, no new strokes or anything new of concern. thanks

## 2018-08-17 NOTE — Telephone Encounter (Signed)
Called and spoke with patient about MRI results per Dr. Jaynee Eagles note. Pt verbalized understanding.

## 2018-08-19 DIAGNOSIS — N2581 Secondary hyperparathyroidism of renal origin: Secondary | ICD-10-CM | POA: Diagnosis not present

## 2018-08-19 DIAGNOSIS — D631 Anemia in chronic kidney disease: Secondary | ICD-10-CM | POA: Diagnosis not present

## 2018-08-19 DIAGNOSIS — N186 End stage renal disease: Secondary | ICD-10-CM | POA: Diagnosis not present

## 2018-08-22 DIAGNOSIS — N186 End stage renal disease: Secondary | ICD-10-CM | POA: Diagnosis not present

## 2018-08-22 DIAGNOSIS — N2581 Secondary hyperparathyroidism of renal origin: Secondary | ICD-10-CM | POA: Diagnosis not present

## 2018-08-22 DIAGNOSIS — D631 Anemia in chronic kidney disease: Secondary | ICD-10-CM | POA: Diagnosis not present

## 2018-08-24 DIAGNOSIS — N2581 Secondary hyperparathyroidism of renal origin: Secondary | ICD-10-CM | POA: Diagnosis not present

## 2018-08-24 DIAGNOSIS — D631 Anemia in chronic kidney disease: Secondary | ICD-10-CM | POA: Diagnosis not present

## 2018-08-24 DIAGNOSIS — N186 End stage renal disease: Secondary | ICD-10-CM | POA: Diagnosis not present

## 2018-08-26 DIAGNOSIS — N186 End stage renal disease: Secondary | ICD-10-CM | POA: Diagnosis not present

## 2018-08-26 DIAGNOSIS — N2581 Secondary hyperparathyroidism of renal origin: Secondary | ICD-10-CM | POA: Diagnosis not present

## 2018-08-26 DIAGNOSIS — D631 Anemia in chronic kidney disease: Secondary | ICD-10-CM | POA: Diagnosis not present

## 2018-08-29 DIAGNOSIS — D631 Anemia in chronic kidney disease: Secondary | ICD-10-CM | POA: Diagnosis not present

## 2018-08-29 DIAGNOSIS — N2581 Secondary hyperparathyroidism of renal origin: Secondary | ICD-10-CM | POA: Diagnosis not present

## 2018-08-29 DIAGNOSIS — N186 End stage renal disease: Secondary | ICD-10-CM | POA: Diagnosis not present

## 2018-08-31 DIAGNOSIS — N186 End stage renal disease: Secondary | ICD-10-CM | POA: Diagnosis not present

## 2018-08-31 DIAGNOSIS — N2581 Secondary hyperparathyroidism of renal origin: Secondary | ICD-10-CM | POA: Diagnosis not present

## 2018-08-31 DIAGNOSIS — D631 Anemia in chronic kidney disease: Secondary | ICD-10-CM | POA: Diagnosis not present

## 2018-09-02 DIAGNOSIS — N186 End stage renal disease: Secondary | ICD-10-CM | POA: Diagnosis not present

## 2018-09-02 DIAGNOSIS — D631 Anemia in chronic kidney disease: Secondary | ICD-10-CM | POA: Diagnosis not present

## 2018-09-02 DIAGNOSIS — N2581 Secondary hyperparathyroidism of renal origin: Secondary | ICD-10-CM | POA: Diagnosis not present

## 2018-09-05 ENCOUNTER — Emergency Department (HOSPITAL_COMMUNITY)
Admission: EM | Admit: 2018-09-05 | Discharge: 2018-09-05 | Disposition: A | Discharge status: Home / Self Care | Payer: Medicare Other | Attending: Emergency Medicine | Admitting: Emergency Medicine

## 2018-09-05 ENCOUNTER — Emergency Department (HOSPITAL_COMMUNITY): Payer: Medicare Other

## 2018-09-05 ENCOUNTER — Encounter (HOSPITAL_COMMUNITY): Payer: Self-pay

## 2018-09-05 ENCOUNTER — Other Ambulatory Visit: Payer: Self-pay

## 2018-09-05 DIAGNOSIS — N2581 Secondary hyperparathyroidism of renal origin: Secondary | ICD-10-CM | POA: Diagnosis not present

## 2018-09-05 DIAGNOSIS — N186 End stage renal disease: Secondary | ICD-10-CM | POA: Diagnosis not present

## 2018-09-05 DIAGNOSIS — R52 Pain, unspecified: Secondary | ICD-10-CM | POA: Diagnosis not present

## 2018-09-05 DIAGNOSIS — I132 Hypertensive heart and chronic kidney disease with heart failure and with stage 5 chronic kidney disease, or end stage renal disease: Secondary | ICD-10-CM | POA: Diagnosis not present

## 2018-09-05 DIAGNOSIS — Z79899 Other long term (current) drug therapy: Secondary | ICD-10-CM | POA: Diagnosis not present

## 2018-09-05 DIAGNOSIS — R1084 Generalized abdominal pain: Secondary | ICD-10-CM | POA: Diagnosis not present

## 2018-09-05 DIAGNOSIS — Z87891 Personal history of nicotine dependence: Secondary | ICD-10-CM | POA: Diagnosis not present

## 2018-09-05 DIAGNOSIS — M542 Cervicalgia: Secondary | ICD-10-CM | POA: Diagnosis not present

## 2018-09-05 DIAGNOSIS — S299XXA Unspecified injury of thorax, initial encounter: Secondary | ICD-10-CM | POA: Diagnosis not present

## 2018-09-05 DIAGNOSIS — R42 Dizziness and giddiness: Secondary | ICD-10-CM | POA: Diagnosis not present

## 2018-09-05 DIAGNOSIS — I509 Heart failure, unspecified: Secondary | ICD-10-CM | POA: Insufficient documentation

## 2018-09-05 DIAGNOSIS — D631 Anemia in chronic kidney disease: Secondary | ICD-10-CM | POA: Diagnosis not present

## 2018-09-05 MED ORDER — ACETAMINOPHEN 500 MG PO TABS: 500.0000 mg | ORAL_TABLET | Freq: Four times a day (QID) | ORAL | 0 refills | Status: DC | PRN

## 2018-09-05 MED ORDER — METHOCARBAMOL 500 MG PO TABS: 500.0000 mg | ORAL_TABLET | Freq: Two times a day (BID) | ORAL | 0 refills | Status: DC

## 2018-09-05 NOTE — ED Notes (Signed)
Patient transported to X-ray 
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
## 2018-09-05 NOTE — ED Notes (Signed)
Fast track patient, see provider's assessments.
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
## 2018-09-05 NOTE — ED Notes (Signed)
EDP in the room

## 2018-09-07 DIAGNOSIS — N2581 Secondary hyperparathyroidism of renal origin: Secondary | ICD-10-CM | POA: Diagnosis not present

## 2018-09-07 DIAGNOSIS — D631 Anemia in chronic kidney disease: Secondary | ICD-10-CM | POA: Diagnosis not present

## 2018-09-07 DIAGNOSIS — N186 End stage renal disease: Secondary | ICD-10-CM | POA: Diagnosis not present

## 2018-09-09 DIAGNOSIS — N2581 Secondary hyperparathyroidism of renal origin: Secondary | ICD-10-CM | POA: Diagnosis not present

## 2018-09-09 DIAGNOSIS — N186 End stage renal disease: Secondary | ICD-10-CM | POA: Diagnosis not present

## 2018-09-09 DIAGNOSIS — D631 Anemia in chronic kidney disease: Secondary | ICD-10-CM | POA: Diagnosis not present

## 2018-09-12 DIAGNOSIS — D631 Anemia in chronic kidney disease: Secondary | ICD-10-CM | POA: Diagnosis not present

## 2018-09-12 DIAGNOSIS — N186 End stage renal disease: Secondary | ICD-10-CM | POA: Diagnosis not present

## 2018-09-12 DIAGNOSIS — N2581 Secondary hyperparathyroidism of renal origin: Secondary | ICD-10-CM | POA: Diagnosis not present

## 2018-09-13 DIAGNOSIS — Z992 Dependence on renal dialysis: Secondary | ICD-10-CM | POA: Diagnosis not present

## 2018-09-13 DIAGNOSIS — T8612 Kidney transplant failure: Secondary | ICD-10-CM | POA: Diagnosis not present

## 2018-09-13 DIAGNOSIS — N186 End stage renal disease: Secondary | ICD-10-CM | POA: Diagnosis not present

## 2018-09-14 DIAGNOSIS — D509 Iron deficiency anemia, unspecified: Secondary | ICD-10-CM | POA: Diagnosis not present

## 2018-09-14 DIAGNOSIS — D631 Anemia in chronic kidney disease: Secondary | ICD-10-CM | POA: Diagnosis not present

## 2018-09-14 DIAGNOSIS — N186 End stage renal disease: Secondary | ICD-10-CM | POA: Diagnosis not present

## 2018-09-14 DIAGNOSIS — N2581 Secondary hyperparathyroidism of renal origin: Secondary | ICD-10-CM | POA: Diagnosis not present

## 2018-09-16 ENCOUNTER — Other Ambulatory Visit: Payer: Self-pay

## 2018-09-16 ENCOUNTER — Emergency Department (HOSPITAL_COMMUNITY)
Admission: EM | Admit: 2018-09-16 | Discharge: 2018-09-17 | Disposition: A | Discharge status: Home / Self Care | Payer: Medicare Other | Attending: Emergency Medicine | Admitting: Emergency Medicine

## 2018-09-16 ENCOUNTER — Emergency Department (HOSPITAL_COMMUNITY): Payer: Medicare Other

## 2018-09-16 ENCOUNTER — Encounter (HOSPITAL_COMMUNITY): Payer: Self-pay

## 2018-09-16 DIAGNOSIS — H532 Diplopia: Secondary | ICD-10-CM | POA: Diagnosis not present

## 2018-09-16 DIAGNOSIS — Z940 Kidney transplant status: Secondary | ICD-10-CM | POA: Insufficient documentation

## 2018-09-16 DIAGNOSIS — R519 Headache, unspecified: Secondary | ICD-10-CM

## 2018-09-16 DIAGNOSIS — Z87891 Personal history of nicotine dependence: Secondary | ICD-10-CM | POA: Diagnosis not present

## 2018-09-16 DIAGNOSIS — R51 Headache: Secondary | ICD-10-CM | POA: Insufficient documentation

## 2018-09-16 DIAGNOSIS — Z7901 Long term (current) use of anticoagulants: Secondary | ICD-10-CM | POA: Insufficient documentation

## 2018-09-16 DIAGNOSIS — N186 End stage renal disease: Secondary | ICD-10-CM | POA: Insufficient documentation

## 2018-09-16 DIAGNOSIS — D509 Iron deficiency anemia, unspecified: Secondary | ICD-10-CM | POA: Diagnosis not present

## 2018-09-16 DIAGNOSIS — I509 Heart failure, unspecified: Secondary | ICD-10-CM | POA: Diagnosis not present

## 2018-09-16 DIAGNOSIS — R42 Dizziness and giddiness: Secondary | ICD-10-CM

## 2018-09-16 DIAGNOSIS — Z992 Dependence on renal dialysis: Secondary | ICD-10-CM | POA: Insufficient documentation

## 2018-09-16 DIAGNOSIS — I132 Hypertensive heart and chronic kidney disease with heart failure and with stage 5 chronic kidney disease, or end stage renal disease: Secondary | ICD-10-CM | POA: Insufficient documentation

## 2018-09-16 DIAGNOSIS — D631 Anemia in chronic kidney disease: Secondary | ICD-10-CM | POA: Diagnosis not present

## 2018-09-16 DIAGNOSIS — Z79899 Other long term (current) drug therapy: Secondary | ICD-10-CM | POA: Diagnosis not present

## 2018-09-16 DIAGNOSIS — Z8673 Personal history of transient ischemic attack (TIA), and cerebral infarction without residual deficits: Secondary | ICD-10-CM | POA: Diagnosis not present

## 2018-09-16 DIAGNOSIS — R270 Ataxia, unspecified: Secondary | ICD-10-CM | POA: Diagnosis not present

## 2018-09-16 DIAGNOSIS — N2581 Secondary hyperparathyroidism of renal origin: Secondary | ICD-10-CM | POA: Diagnosis not present

## 2018-09-16 LAB — BASIC METABOLIC PANEL
ANION GAP: 10 (ref 5–15)
BUN: 7 mg/dL — ABNORMAL LOW (ref 8–23)
CALCIUM: 8.6 mg/dL — AB (ref 8.9–10.3)
CO2: 34 mmol/L — AB (ref 22–32)
CREATININE: 3.48 mg/dL — AB (ref 0.44–1.00)
Chloride: 91 mmol/L — ABNORMAL LOW (ref 98–111)
GFR calc Af Amer: 15 mL/min — ABNORMAL LOW (ref >60)
GFR calc non Af Amer: 13 mL/min — ABNORMAL LOW (ref >60)
Glucose, Bld: 76 mg/dL (ref 70–99)
Potassium: 3.6 mmol/L (ref 3.5–5.1)
SODIUM: 135 mmol/L (ref 135–145)

## 2018-09-16 LAB — CBC WITH DIFFERENTIAL/PLATELET
ABS IMMATURE GRANULOCYTES: 0 10*3/uL (ref 0.0–0.1)
BASOS ABS: 0 10*3/uL (ref 0.0–0.1)
BASOS PCT: 1 %
EOS ABS: 0.2 10*3/uL (ref 0.0–0.7)
Eosinophils Relative: 3 %
HCT: 38.7 % (ref 36.0–46.0)
Hemoglobin: 12.3 g/dL (ref 12.0–15.0)
IMMATURE GRANULOCYTES: 0 %
Lymphocytes Relative: 32 %
Lymphs Abs: 1.5 10*3/uL (ref 0.7–4.0)
MCH: 29.4 pg (ref 26.0–34.0)
MCHC: 31.8 g/dL (ref 30.0–36.0)
MCV: 92.4 fL (ref 78.0–100.0)
Monocytes Absolute: 0.6 10*3/uL (ref 0.1–1.0)
Monocytes Relative: 13 %
NEUTROS ABS: 2.4 10*3/uL (ref 1.7–7.7)
Neutrophils Relative %: 51 %
PLATELETS: 262 10*3/uL (ref 150–400)
RBC: 4.19 MIL/uL (ref 3.87–5.11)
RDW: 17.1 % — AB (ref 11.5–15.5)
WBC: 4.7 10*3/uL (ref 4.0–10.5)

## 2018-09-16 MED ORDER — METOCLOPRAMIDE HCL 5 MG/ML IJ SOLN
5.0000 mg | Freq: Once | INTRAMUSCULAR | Status: AC
  Administered 2018-09-16: 5 mg via INTRAVENOUS
  Filled 2018-09-16: qty 2

## 2018-09-16 MED ORDER — DIPHENHYDRAMINE HCL 50 MG/ML IJ SOLN
12.5000 mg | Freq: Once | INTRAMUSCULAR | Status: AC
  Administered 2018-09-16: 12.5 mg via INTRAVENOUS
  Filled 2018-09-16: qty 1

## 2018-09-16 NOTE — ED Triage Notes (Signed)
Pt brought in by [facility] from dialysis for vertigo/dizziness that started on Tuesday. Pt states she has been taking her home medications without relief. Pt states she received her full dialysis treatment today. Pt A+Ox4 and states her only complaint is of the dizziness.
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
## 2018-09-16 NOTE — ED Notes (Signed)
Pt ambulated w/ steady gait, denied dizziness.

## 2018-09-16 NOTE — ED Notes (Signed)
Patient's family provided with [place] sandwiches and water

## 2018-09-16 NOTE — ED Notes (Signed)
Patient transported to MRI 
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
## 2018-09-17 NOTE — Discharge Instructions (Addendum)
1. Medications: usual home medications 2. Treatment: rest, drink plenty of fluids,  3. Follow Up: Please followup with your primary doctor, ENT, neurologist and ophthamologist in 2-3 days for discussion of your diagnoses and further evaluation after today's visit; if you do not have a primary care doctor use the resource guide provided to find one; Please return to the ER for new or worsening symptoms, changes in vision, worsening headaches or other concerns

## 2018-09-19 DIAGNOSIS — N186 End stage renal disease: Secondary | ICD-10-CM | POA: Diagnosis not present

## 2018-09-19 DIAGNOSIS — N2581 Secondary hyperparathyroidism of renal origin: Secondary | ICD-10-CM | POA: Diagnosis not present

## 2018-09-19 DIAGNOSIS — D631 Anemia in chronic kidney disease: Secondary | ICD-10-CM | POA: Diagnosis not present

## 2018-09-19 DIAGNOSIS — D509 Iron deficiency anemia, unspecified: Secondary | ICD-10-CM | POA: Diagnosis not present

## 2018-09-21 DIAGNOSIS — N186 End stage renal disease: Secondary | ICD-10-CM | POA: Diagnosis not present

## 2018-09-21 DIAGNOSIS — N2581 Secondary hyperparathyroidism of renal origin: Secondary | ICD-10-CM | POA: Diagnosis not present

## 2018-09-21 DIAGNOSIS — D509 Iron deficiency anemia, unspecified: Secondary | ICD-10-CM | POA: Diagnosis not present

## 2018-09-21 DIAGNOSIS — D631 Anemia in chronic kidney disease: Secondary | ICD-10-CM | POA: Diagnosis not present

## 2018-09-23 DIAGNOSIS — D631 Anemia in chronic kidney disease: Secondary | ICD-10-CM | POA: Diagnosis not present

## 2018-09-23 DIAGNOSIS — N186 End stage renal disease: Secondary | ICD-10-CM | POA: Diagnosis not present

## 2018-09-23 DIAGNOSIS — D509 Iron deficiency anemia, unspecified: Secondary | ICD-10-CM | POA: Diagnosis not present

## 2018-09-23 DIAGNOSIS — N2581 Secondary hyperparathyroidism of renal origin: Secondary | ICD-10-CM | POA: Diagnosis not present

## 2018-09-26 DIAGNOSIS — N2581 Secondary hyperparathyroidism of renal origin: Secondary | ICD-10-CM | POA: Diagnosis not present

## 2018-09-26 DIAGNOSIS — N186 End stage renal disease: Secondary | ICD-10-CM | POA: Diagnosis not present

## 2018-09-26 DIAGNOSIS — D509 Iron deficiency anemia, unspecified: Secondary | ICD-10-CM | POA: Diagnosis not present

## 2018-09-26 DIAGNOSIS — D631 Anemia in chronic kidney disease: Secondary | ICD-10-CM | POA: Diagnosis not present

## 2018-09-28 DIAGNOSIS — N186 End stage renal disease: Secondary | ICD-10-CM | POA: Diagnosis not present

## 2018-09-28 DIAGNOSIS — N2581 Secondary hyperparathyroidism of renal origin: Secondary | ICD-10-CM | POA: Diagnosis not present

## 2018-09-28 DIAGNOSIS — D509 Iron deficiency anemia, unspecified: Secondary | ICD-10-CM | POA: Diagnosis not present

## 2018-09-28 DIAGNOSIS — D631 Anemia in chronic kidney disease: Secondary | ICD-10-CM | POA: Diagnosis not present

## 2018-09-30 DIAGNOSIS — D509 Iron deficiency anemia, unspecified: Secondary | ICD-10-CM | POA: Diagnosis not present

## 2018-09-30 DIAGNOSIS — D631 Anemia in chronic kidney disease: Secondary | ICD-10-CM | POA: Diagnosis not present

## 2018-09-30 DIAGNOSIS — N2581 Secondary hyperparathyroidism of renal origin: Secondary | ICD-10-CM | POA: Diagnosis not present

## 2018-09-30 DIAGNOSIS — N186 End stage renal disease: Secondary | ICD-10-CM | POA: Diagnosis not present

## 2018-10-03 DIAGNOSIS — N2581 Secondary hyperparathyroidism of renal origin: Secondary | ICD-10-CM | POA: Diagnosis not present

## 2018-10-03 DIAGNOSIS — D509 Iron deficiency anemia, unspecified: Secondary | ICD-10-CM | POA: Diagnosis not present

## 2018-10-03 DIAGNOSIS — N186 End stage renal disease: Secondary | ICD-10-CM | POA: Diagnosis not present

## 2018-10-03 DIAGNOSIS — D631 Anemia in chronic kidney disease: Secondary | ICD-10-CM | POA: Diagnosis not present

## 2018-10-05 DIAGNOSIS — D509 Iron deficiency anemia, unspecified: Secondary | ICD-10-CM | POA: Diagnosis not present

## 2018-10-05 DIAGNOSIS — N2581 Secondary hyperparathyroidism of renal origin: Secondary | ICD-10-CM | POA: Diagnosis not present

## 2018-10-05 DIAGNOSIS — D631 Anemia in chronic kidney disease: Secondary | ICD-10-CM | POA: Diagnosis not present

## 2018-10-05 DIAGNOSIS — N186 End stage renal disease: Secondary | ICD-10-CM | POA: Diagnosis not present

## 2018-10-07 DIAGNOSIS — D631 Anemia in chronic kidney disease: Secondary | ICD-10-CM | POA: Diagnosis not present

## 2018-10-07 DIAGNOSIS — N186 End stage renal disease: Secondary | ICD-10-CM | POA: Diagnosis not present

## 2018-10-07 DIAGNOSIS — N2581 Secondary hyperparathyroidism of renal origin: Secondary | ICD-10-CM | POA: Diagnosis not present

## 2018-10-07 DIAGNOSIS — D509 Iron deficiency anemia, unspecified: Secondary | ICD-10-CM | POA: Diagnosis not present

## 2018-10-10 DIAGNOSIS — D631 Anemia in chronic kidney disease: Secondary | ICD-10-CM | POA: Diagnosis not present

## 2018-10-10 DIAGNOSIS — D509 Iron deficiency anemia, unspecified: Secondary | ICD-10-CM | POA: Diagnosis not present

## 2018-10-10 DIAGNOSIS — N2581 Secondary hyperparathyroidism of renal origin: Secondary | ICD-10-CM | POA: Diagnosis not present

## 2018-10-10 DIAGNOSIS — N186 End stage renal disease: Secondary | ICD-10-CM | POA: Diagnosis not present

## 2018-10-12 DIAGNOSIS — N186 End stage renal disease: Secondary | ICD-10-CM | POA: Diagnosis not present

## 2018-10-12 DIAGNOSIS — D631 Anemia in chronic kidney disease: Secondary | ICD-10-CM | POA: Diagnosis not present

## 2018-10-12 DIAGNOSIS — D509 Iron deficiency anemia, unspecified: Secondary | ICD-10-CM | POA: Diagnosis not present

## 2018-10-12 DIAGNOSIS — N2581 Secondary hyperparathyroidism of renal origin: Secondary | ICD-10-CM | POA: Diagnosis not present

## 2018-10-14 DIAGNOSIS — T8612 Kidney transplant failure: Secondary | ICD-10-CM | POA: Diagnosis not present

## 2018-10-14 DIAGNOSIS — D631 Anemia in chronic kidney disease: Secondary | ICD-10-CM | POA: Diagnosis not present

## 2018-10-14 DIAGNOSIS — N2581 Secondary hyperparathyroidism of renal origin: Secondary | ICD-10-CM | POA: Diagnosis not present

## 2018-10-14 DIAGNOSIS — N186 End stage renal disease: Secondary | ICD-10-CM | POA: Diagnosis not present

## 2018-10-14 DIAGNOSIS — Z992 Dependence on renal dialysis: Secondary | ICD-10-CM | POA: Diagnosis not present

## 2018-10-17 DIAGNOSIS — N186 End stage renal disease: Secondary | ICD-10-CM | POA: Diagnosis not present

## 2018-10-17 DIAGNOSIS — D631 Anemia in chronic kidney disease: Secondary | ICD-10-CM | POA: Diagnosis not present

## 2018-10-17 DIAGNOSIS — N2581 Secondary hyperparathyroidism of renal origin: Secondary | ICD-10-CM | POA: Diagnosis not present

## 2018-10-19 DIAGNOSIS — N2581 Secondary hyperparathyroidism of renal origin: Secondary | ICD-10-CM | POA: Diagnosis not present

## 2018-10-19 DIAGNOSIS — D631 Anemia in chronic kidney disease: Secondary | ICD-10-CM | POA: Diagnosis not present

## 2018-10-19 DIAGNOSIS — N186 End stage renal disease: Secondary | ICD-10-CM | POA: Diagnosis not present

## 2018-10-21 DIAGNOSIS — D631 Anemia in chronic kidney disease: Secondary | ICD-10-CM | POA: Diagnosis not present

## 2018-10-21 DIAGNOSIS — N2581 Secondary hyperparathyroidism of renal origin: Secondary | ICD-10-CM | POA: Diagnosis not present

## 2018-10-21 DIAGNOSIS — N186 End stage renal disease: Secondary | ICD-10-CM | POA: Diagnosis not present

## 2018-10-24 DIAGNOSIS — N2581 Secondary hyperparathyroidism of renal origin: Secondary | ICD-10-CM | POA: Diagnosis not present

## 2018-10-24 DIAGNOSIS — D631 Anemia in chronic kidney disease: Secondary | ICD-10-CM | POA: Diagnosis not present

## 2018-10-24 DIAGNOSIS — N186 End stage renal disease: Secondary | ICD-10-CM | POA: Diagnosis not present

## 2018-10-26 DIAGNOSIS — N2581 Secondary hyperparathyroidism of renal origin: Secondary | ICD-10-CM | POA: Diagnosis not present

## 2018-10-26 DIAGNOSIS — N186 End stage renal disease: Secondary | ICD-10-CM | POA: Diagnosis not present

## 2018-10-26 DIAGNOSIS — D631 Anemia in chronic kidney disease: Secondary | ICD-10-CM | POA: Diagnosis not present

## 2018-10-28 DIAGNOSIS — D631 Anemia in chronic kidney disease: Secondary | ICD-10-CM | POA: Diagnosis not present

## 2018-10-28 DIAGNOSIS — N2581 Secondary hyperparathyroidism of renal origin: Secondary | ICD-10-CM | POA: Diagnosis not present

## 2018-10-28 DIAGNOSIS — N186 End stage renal disease: Secondary | ICD-10-CM | POA: Diagnosis not present

## 2018-10-31 DIAGNOSIS — N2581 Secondary hyperparathyroidism of renal origin: Secondary | ICD-10-CM | POA: Diagnosis not present

## 2018-10-31 DIAGNOSIS — N186 End stage renal disease: Secondary | ICD-10-CM | POA: Diagnosis not present

## 2018-10-31 DIAGNOSIS — D631 Anemia in chronic kidney disease: Secondary | ICD-10-CM | POA: Diagnosis not present

## 2018-11-02 DIAGNOSIS — N186 End stage renal disease: Secondary | ICD-10-CM | POA: Diagnosis not present

## 2018-11-02 DIAGNOSIS — D631 Anemia in chronic kidney disease: Secondary | ICD-10-CM | POA: Diagnosis not present

## 2018-11-02 DIAGNOSIS — N2581 Secondary hyperparathyroidism of renal origin: Secondary | ICD-10-CM | POA: Diagnosis not present

## 2018-11-04 DIAGNOSIS — N186 End stage renal disease: Secondary | ICD-10-CM | POA: Diagnosis not present

## 2018-11-04 DIAGNOSIS — D631 Anemia in chronic kidney disease: Secondary | ICD-10-CM | POA: Diagnosis not present

## 2018-11-04 DIAGNOSIS — N2581 Secondary hyperparathyroidism of renal origin: Secondary | ICD-10-CM | POA: Diagnosis not present

## 2018-11-06 DIAGNOSIS — N186 End stage renal disease: Secondary | ICD-10-CM | POA: Diagnosis not present

## 2018-11-06 DIAGNOSIS — N2581 Secondary hyperparathyroidism of renal origin: Secondary | ICD-10-CM | POA: Diagnosis not present

## 2018-11-06 DIAGNOSIS — D631 Anemia in chronic kidney disease: Secondary | ICD-10-CM | POA: Diagnosis not present

## 2018-11-08 DIAGNOSIS — D631 Anemia in chronic kidney disease: Secondary | ICD-10-CM | POA: Diagnosis not present

## 2018-11-08 DIAGNOSIS — N2581 Secondary hyperparathyroidism of renal origin: Secondary | ICD-10-CM | POA: Diagnosis not present

## 2018-11-08 DIAGNOSIS — N186 End stage renal disease: Secondary | ICD-10-CM | POA: Diagnosis not present

## 2018-11-11 DIAGNOSIS — N2581 Secondary hyperparathyroidism of renal origin: Secondary | ICD-10-CM | POA: Diagnosis not present

## 2018-11-11 DIAGNOSIS — D631 Anemia in chronic kidney disease: Secondary | ICD-10-CM | POA: Diagnosis not present

## 2018-11-11 DIAGNOSIS — N186 End stage renal disease: Secondary | ICD-10-CM | POA: Diagnosis not present

## 2018-11-13 DIAGNOSIS — T8612 Kidney transplant failure: Secondary | ICD-10-CM | POA: Diagnosis not present

## 2018-11-13 DIAGNOSIS — Z992 Dependence on renal dialysis: Secondary | ICD-10-CM | POA: Diagnosis not present

## 2018-11-13 DIAGNOSIS — N186 End stage renal disease: Secondary | ICD-10-CM | POA: Diagnosis not present

## 2018-11-14 DIAGNOSIS — N186 End stage renal disease: Secondary | ICD-10-CM | POA: Diagnosis not present

## 2018-11-14 DIAGNOSIS — Z23 Encounter for immunization: Secondary | ICD-10-CM | POA: Diagnosis not present

## 2018-11-14 DIAGNOSIS — D631 Anemia in chronic kidney disease: Secondary | ICD-10-CM | POA: Diagnosis not present

## 2018-11-14 DIAGNOSIS — N2581 Secondary hyperparathyroidism of renal origin: Secondary | ICD-10-CM | POA: Diagnosis not present

## 2018-11-14 DIAGNOSIS — D509 Iron deficiency anemia, unspecified: Secondary | ICD-10-CM | POA: Diagnosis not present

## 2018-11-16 ENCOUNTER — Encounter: Payer: Self-pay | Admitting: *Deleted

## 2018-11-16 ENCOUNTER — Telehealth: Payer: Self-pay | Admitting: *Deleted

## 2018-11-16 ENCOUNTER — Institutional Professional Consult (permissible substitution): Payer: Medicare Other | Admitting: Neurology

## 2018-11-16 DIAGNOSIS — N186 End stage renal disease: Secondary | ICD-10-CM | POA: Diagnosis not present

## 2018-11-16 DIAGNOSIS — D631 Anemia in chronic kidney disease: Secondary | ICD-10-CM | POA: Diagnosis not present

## 2018-11-16 DIAGNOSIS — Z23 Encounter for immunization: Secondary | ICD-10-CM | POA: Diagnosis not present

## 2018-11-16 DIAGNOSIS — N2581 Secondary hyperparathyroidism of renal origin: Secondary | ICD-10-CM | POA: Diagnosis not present

## 2018-11-16 DIAGNOSIS — D509 Iron deficiency anemia, unspecified: Secondary | ICD-10-CM | POA: Diagnosis not present

## 2018-11-16 NOTE — Telephone Encounter (Signed)
Pt no showed her new consult appt on 11/16/2018 with Dr. Jaynee Eagles.

## 2018-11-17 ENCOUNTER — Telehealth: Payer: Self-pay | Admitting: Neurology

## 2018-11-17 NOTE — Telephone Encounter (Signed)
Pt came in today thinking she had an appt scheduled but her appt was for 12/4. She stated she does not understand why it was scheduled on a Wed. when she had informed us that she has dialysis on Monday, Wednesdays and Fridays. Please call pt at [phone] to reschedule appt on a Tuesday or Thursday only.

## 2018-11-17 NOTE — Telephone Encounter (Signed)
Spoke with patient and discussed that her appt was made for 12/4 with her on the phone and she received a message with appt reminder the day before the appointment. Apologized for any confusion. Advised pt that the first available appt we have on either a Tuesday or Thursday (per pt request) is 01/12/18. Pt chose a morning appt @ 09:00 arrival 08:30. Pt verbalized understanding. She declined a spot on the wait list and said that 1/30 was fine. Advised pt that she would need to bring $200. Pt did not understanding why and stated  That she is on a fixed income and has medicare and [employer] supplement. She asked about a payment plan. RN advised that she would send the message to billing to have her questions answered. Pt verbalized appreciation.

## 2018-11-18 DIAGNOSIS — D509 Iron deficiency anemia, unspecified: Secondary | ICD-10-CM | POA: Diagnosis not present

## 2018-11-18 DIAGNOSIS — D631 Anemia in chronic kidney disease: Secondary | ICD-10-CM | POA: Diagnosis not present

## 2018-11-18 DIAGNOSIS — N186 End stage renal disease: Secondary | ICD-10-CM | POA: Diagnosis not present

## 2018-11-18 DIAGNOSIS — N2581 Secondary hyperparathyroidism of renal origin: Secondary | ICD-10-CM | POA: Diagnosis not present

## 2018-11-18 DIAGNOSIS — Z23 Encounter for immunization: Secondary | ICD-10-CM | POA: Diagnosis not present

## 2018-11-21 DIAGNOSIS — N186 End stage renal disease: Secondary | ICD-10-CM | POA: Diagnosis not present

## 2018-11-21 DIAGNOSIS — D631 Anemia in chronic kidney disease: Secondary | ICD-10-CM | POA: Diagnosis not present

## 2018-11-21 DIAGNOSIS — D509 Iron deficiency anemia, unspecified: Secondary | ICD-10-CM | POA: Diagnosis not present

## 2018-11-21 DIAGNOSIS — Z23 Encounter for immunization: Secondary | ICD-10-CM | POA: Diagnosis not present

## 2018-11-21 DIAGNOSIS — N2581 Secondary hyperparathyroidism of renal origin: Secondary | ICD-10-CM | POA: Diagnosis not present

## 2018-11-23 DIAGNOSIS — D631 Anemia in chronic kidney disease: Secondary | ICD-10-CM | POA: Diagnosis not present

## 2018-11-23 DIAGNOSIS — N186 End stage renal disease: Secondary | ICD-10-CM | POA: Diagnosis not present

## 2018-11-23 DIAGNOSIS — D509 Iron deficiency anemia, unspecified: Secondary | ICD-10-CM | POA: Diagnosis not present

## 2018-11-23 DIAGNOSIS — Z23 Encounter for immunization: Secondary | ICD-10-CM | POA: Diagnosis not present

## 2018-11-23 DIAGNOSIS — N2581 Secondary hyperparathyroidism of renal origin: Secondary | ICD-10-CM | POA: Diagnosis not present

## 2018-11-25 DIAGNOSIS — N186 End stage renal disease: Secondary | ICD-10-CM | POA: Diagnosis not present

## 2018-11-25 DIAGNOSIS — N2581 Secondary hyperparathyroidism of renal origin: Secondary | ICD-10-CM | POA: Diagnosis not present

## 2018-11-25 DIAGNOSIS — D509 Iron deficiency anemia, unspecified: Secondary | ICD-10-CM | POA: Diagnosis not present

## 2018-11-25 DIAGNOSIS — Z23 Encounter for immunization: Secondary | ICD-10-CM | POA: Diagnosis not present

## 2018-11-25 DIAGNOSIS — D631 Anemia in chronic kidney disease: Secondary | ICD-10-CM | POA: Diagnosis not present

## 2018-11-28 DIAGNOSIS — Z23 Encounter for immunization: Secondary | ICD-10-CM | POA: Diagnosis not present

## 2018-11-28 DIAGNOSIS — N2581 Secondary hyperparathyroidism of renal origin: Secondary | ICD-10-CM | POA: Diagnosis not present

## 2018-11-28 DIAGNOSIS — D631 Anemia in chronic kidney disease: Secondary | ICD-10-CM | POA: Diagnosis not present

## 2018-11-28 DIAGNOSIS — N186 End stage renal disease: Secondary | ICD-10-CM | POA: Diagnosis not present

## 2018-11-28 DIAGNOSIS — D509 Iron deficiency anemia, unspecified: Secondary | ICD-10-CM | POA: Diagnosis not present

## 2018-11-30 DIAGNOSIS — Z23 Encounter for immunization: Secondary | ICD-10-CM | POA: Diagnosis not present

## 2018-11-30 DIAGNOSIS — N2581 Secondary hyperparathyroidism of renal origin: Secondary | ICD-10-CM | POA: Diagnosis not present

## 2018-11-30 DIAGNOSIS — D631 Anemia in chronic kidney disease: Secondary | ICD-10-CM | POA: Diagnosis not present

## 2018-11-30 DIAGNOSIS — N186 End stage renal disease: Secondary | ICD-10-CM | POA: Diagnosis not present

## 2018-11-30 DIAGNOSIS — D509 Iron deficiency anemia, unspecified: Secondary | ICD-10-CM | POA: Diagnosis not present

## 2018-12-02 DIAGNOSIS — N186 End stage renal disease: Secondary | ICD-10-CM | POA: Diagnosis not present

## 2018-12-02 DIAGNOSIS — D509 Iron deficiency anemia, unspecified: Secondary | ICD-10-CM | POA: Diagnosis not present

## 2018-12-02 DIAGNOSIS — Z23 Encounter for immunization: Secondary | ICD-10-CM | POA: Diagnosis not present

## 2018-12-02 DIAGNOSIS — N2581 Secondary hyperparathyroidism of renal origin: Secondary | ICD-10-CM | POA: Diagnosis not present

## 2018-12-02 DIAGNOSIS — D631 Anemia in chronic kidney disease: Secondary | ICD-10-CM | POA: Diagnosis not present

## 2018-12-04 DIAGNOSIS — D509 Iron deficiency anemia, unspecified: Secondary | ICD-10-CM | POA: Diagnosis not present

## 2018-12-04 DIAGNOSIS — D631 Anemia in chronic kidney disease: Secondary | ICD-10-CM | POA: Diagnosis not present

## 2018-12-04 DIAGNOSIS — N186 End stage renal disease: Secondary | ICD-10-CM | POA: Diagnosis not present

## 2018-12-04 DIAGNOSIS — Z23 Encounter for immunization: Secondary | ICD-10-CM | POA: Diagnosis not present

## 2018-12-04 DIAGNOSIS — N2581 Secondary hyperparathyroidism of renal origin: Secondary | ICD-10-CM | POA: Diagnosis not present

## 2018-12-06 DIAGNOSIS — N2581 Secondary hyperparathyroidism of renal origin: Secondary | ICD-10-CM | POA: Diagnosis not present

## 2018-12-06 DIAGNOSIS — D509 Iron deficiency anemia, unspecified: Secondary | ICD-10-CM | POA: Diagnosis not present

## 2018-12-06 DIAGNOSIS — D631 Anemia in chronic kidney disease: Secondary | ICD-10-CM | POA: Diagnosis not present

## 2018-12-06 DIAGNOSIS — N186 End stage renal disease: Secondary | ICD-10-CM | POA: Diagnosis not present

## 2018-12-06 DIAGNOSIS — Z23 Encounter for immunization: Secondary | ICD-10-CM | POA: Diagnosis not present

## 2018-12-09 DIAGNOSIS — Z23 Encounter for immunization: Secondary | ICD-10-CM | POA: Diagnosis not present

## 2018-12-09 DIAGNOSIS — D509 Iron deficiency anemia, unspecified: Secondary | ICD-10-CM | POA: Diagnosis not present

## 2018-12-09 DIAGNOSIS — N2581 Secondary hyperparathyroidism of renal origin: Secondary | ICD-10-CM | POA: Diagnosis not present

## 2018-12-09 DIAGNOSIS — N186 End stage renal disease: Secondary | ICD-10-CM | POA: Diagnosis not present

## 2018-12-09 DIAGNOSIS — D631 Anemia in chronic kidney disease: Secondary | ICD-10-CM | POA: Diagnosis not present

## 2018-12-11 DIAGNOSIS — N186 End stage renal disease: Secondary | ICD-10-CM | POA: Diagnosis not present

## 2018-12-11 DIAGNOSIS — D509 Iron deficiency anemia, unspecified: Secondary | ICD-10-CM | POA: Diagnosis not present

## 2018-12-11 DIAGNOSIS — Z23 Encounter for immunization: Secondary | ICD-10-CM | POA: Diagnosis not present

## 2018-12-11 DIAGNOSIS — N2581 Secondary hyperparathyroidism of renal origin: Secondary | ICD-10-CM | POA: Diagnosis not present

## 2018-12-11 DIAGNOSIS — D631 Anemia in chronic kidney disease: Secondary | ICD-10-CM | POA: Diagnosis not present

## 2018-12-13 ENCOUNTER — Other Ambulatory Visit: Payer: Self-pay

## 2018-12-13 ENCOUNTER — Encounter (HOSPITAL_COMMUNITY): Payer: Self-pay

## 2018-12-13 ENCOUNTER — Observation Stay (HOSPITAL_COMMUNITY)
Admission: EM | Admit: 2018-12-13 | Discharge: 2018-12-14 | Disposition: A | Discharge status: Home / Self Care | Payer: Medicare Other | Attending: Family Medicine | Admitting: Family Medicine

## 2018-12-13 ENCOUNTER — Emergency Department (HOSPITAL_COMMUNITY): Payer: Medicare Other

## 2018-12-13 DIAGNOSIS — I509 Heart failure, unspecified: Secondary | ICD-10-CM

## 2018-12-13 DIAGNOSIS — Z8673 Personal history of transient ischemic attack (TIA), and cerebral infarction without residual deficits: Secondary | ICD-10-CM | POA: Diagnosis not present

## 2018-12-13 DIAGNOSIS — D631 Anemia in chronic kidney disease: Secondary | ICD-10-CM | POA: Insufficient documentation

## 2018-12-13 DIAGNOSIS — I5033 Acute on chronic diastolic (congestive) heart failure: Secondary | ICD-10-CM | POA: Diagnosis not present

## 2018-12-13 DIAGNOSIS — R531 Weakness: Secondary | ICD-10-CM | POA: Diagnosis not present

## 2018-12-13 DIAGNOSIS — N186 End stage renal disease: Secondary | ICD-10-CM | POA: Diagnosis not present

## 2018-12-13 DIAGNOSIS — F418 Other specified anxiety disorders: Secondary | ICD-10-CM | POA: Diagnosis not present

## 2018-12-13 DIAGNOSIS — E785 Hyperlipidemia, unspecified: Secondary | ICD-10-CM | POA: Insufficient documentation

## 2018-12-13 DIAGNOSIS — R000 Tachycardia, unspecified: Secondary | ICD-10-CM | POA: Diagnosis not present

## 2018-12-13 DIAGNOSIS — Z79899 Other long term (current) drug therapy: Secondary | ICD-10-CM | POA: Diagnosis not present

## 2018-12-13 DIAGNOSIS — I132 Hypertensive heart and chronic kidney disease with heart failure and with stage 5 chronic kidney disease, or end stage renal disease: Secondary | ICD-10-CM | POA: Insufficient documentation

## 2018-12-13 DIAGNOSIS — Z992 Dependence on renal dialysis: Secondary | ICD-10-CM | POA: Diagnosis present

## 2018-12-13 DIAGNOSIS — R0602 Shortness of breath: Secondary | ICD-10-CM | POA: Diagnosis not present

## 2018-12-13 DIAGNOSIS — J9601 Acute respiratory failure with hypoxia: Secondary | ICD-10-CM | POA: Insufficient documentation

## 2018-12-13 DIAGNOSIS — I10 Essential (primary) hypertension: Secondary | ICD-10-CM | POA: Diagnosis present

## 2018-12-13 DIAGNOSIS — R197 Diarrhea, unspecified: Secondary | ICD-10-CM | POA: Diagnosis present

## 2018-12-13 DIAGNOSIS — R509 Fever, unspecified: Secondary | ICD-10-CM | POA: Diagnosis present

## 2018-12-13 DIAGNOSIS — R0902 Hypoxemia: Secondary | ICD-10-CM | POA: Diagnosis present

## 2018-12-13 DIAGNOSIS — G709 Myoneural disorder, unspecified: Secondary | ICD-10-CM | POA: Diagnosis not present

## 2018-12-13 DIAGNOSIS — Z87891 Personal history of nicotine dependence: Secondary | ICD-10-CM | POA: Insufficient documentation

## 2018-12-13 DIAGNOSIS — N189 Chronic kidney disease, unspecified: Secondary | ICD-10-CM

## 2018-12-13 DIAGNOSIS — R05 Cough: Secondary | ICD-10-CM | POA: Diagnosis not present

## 2018-12-13 DIAGNOSIS — E8770 Fluid overload, unspecified: Principal | ICD-10-CM

## 2018-12-13 DIAGNOSIS — E7800 Pure hypercholesterolemia, unspecified: Secondary | ICD-10-CM | POA: Insufficient documentation

## 2018-12-13 LAB — INFLUENZA PANEL BY PCR (TYPE A & B)
INFLAPCR: NEGATIVE
Influenza B By PCR: NEGATIVE

## 2018-12-13 LAB — CBC
HCT: 29.6 % — ABNORMAL LOW (ref 36.0–46.0)
Hemoglobin: 9.4 g/dL — ABNORMAL LOW (ref 12.0–15.0)
MCH: 29.9 pg (ref 26.0–34.0)
MCHC: 31.8 g/dL (ref 30.0–36.0)
MCV: 94.3 fL (ref 80.0–100.0)
Platelets: 159 10*3/uL (ref 150–400)
RBC: 3.14 MIL/uL — ABNORMAL LOW (ref 3.87–5.11)
RDW: 17 % — ABNORMAL HIGH (ref 11.5–15.5)
WBC: 7.7 10*3/uL (ref 4.0–10.5)
nRBC: 0 % (ref 0.0–0.2)

## 2018-12-13 LAB — CBC WITH DIFFERENTIAL/PLATELET
ABS IMMATURE GRANULOCYTES: 0.03 10*3/uL (ref 0.00–0.07)
BASOS ABS: 0 10*3/uL (ref 0.0–0.1)
Basophils Relative: 0 %
EOS ABS: 0.1 10*3/uL (ref 0.0–0.5)
Eosinophils Relative: 2 %
HCT: 34.1 % — ABNORMAL LOW (ref 36.0–46.0)
Hemoglobin: 10.9 g/dL — ABNORMAL LOW (ref 12.0–15.0)
IMMATURE GRANULOCYTES: 0 %
LYMPHS ABS: 1.1 10*3/uL (ref 0.7–4.0)
Lymphocytes Relative: 15 %
MCH: 30.4 pg (ref 26.0–34.0)
MCHC: 32 g/dL (ref 30.0–36.0)
MCV: 95 fL (ref 80.0–100.0)
Monocytes Absolute: 1 10*3/uL (ref 0.1–1.0)
Monocytes Relative: 13 %
NEUTROS ABS: 5.5 10*3/uL (ref 1.7–7.7)
NEUTROS PCT: 70 %
NRBC: 0 % (ref 0.0–0.2)
PLATELETS: 202 10*3/uL (ref 150–400)
RBC: 3.59 MIL/uL — ABNORMAL LOW (ref 3.87–5.11)
RDW: 17.1 % — AB (ref 11.5–15.5)
WBC: 7.8 10*3/uL (ref 4.0–10.5)

## 2018-12-13 LAB — RENAL FUNCTION PANEL
Albumin: 3.1 g/dL — ABNORMAL LOW (ref 3.5–5.0)
Albumin: 3.6 g/dL (ref 3.5–5.0)
Anion gap: 15 (ref 5–15)
Anion gap: 16 — ABNORMAL HIGH (ref 5–15)
BUN: 24 mg/dL — ABNORMAL HIGH (ref 8–23)
BUN: 7 mg/dL — ABNORMAL LOW (ref 8–23)
CHLORIDE: 94 mmol/L — AB (ref 98–111)
CO2: 28 mmol/L (ref 22–32)
CO2: 28 mmol/L (ref 22–32)
Calcium: 8.9 mg/dL (ref 8.9–10.3)
Calcium: 9.4 mg/dL (ref 8.9–10.3)
Chloride: 91 mmol/L — ABNORMAL LOW (ref 98–111)
Creatinine, Ser: 7.65 mg/dL — ABNORMAL HIGH (ref 0.44–1.00)
Creatinine, Ser: 3.43 mg/dL — ABNORMAL HIGH (ref 0.44–1.00)
GFR calc Af Amer: 6 mL/min — ABNORMAL LOW (ref >60)
GFR calc Af Amer: 15 mL/min — ABNORMAL LOW (ref >60)
GFR calc non Af Amer: 5 mL/min — ABNORMAL LOW (ref >60)
GFR, EST NON AFRICAN AMERICAN: 13 mL/min — AB (ref >60)
Glucose, Bld: 91 mg/dL (ref 70–99)
Glucose, Bld: 101 mg/dL — ABNORMAL HIGH (ref 70–99)
Phosphorus: 5.6 mg/dL — ABNORMAL HIGH (ref 2.5–4.6)
Phosphorus: 3.3 mg/dL (ref 2.5–4.6)
Potassium: 5.6 mmol/L — ABNORMAL HIGH (ref 3.5–5.1)
Potassium: 4.4 mmol/L (ref 3.5–5.1)
Sodium: 134 mmol/L — ABNORMAL LOW (ref 135–145)
Sodium: 138 mmol/L (ref 135–145)

## 2018-12-13 LAB — COMPREHENSIVE METABOLIC PANEL
ALBUMIN: 3.5 g/dL (ref 3.5–5.0)
ALK PHOS: 65 U/L (ref 38–126)
ALT: 13 U/L (ref 0–44)
AST: 29 U/L (ref 15–41)
Anion gap: 16 — ABNORMAL HIGH (ref 5–15)
BUN: 25 mg/dL — AB (ref 8–23)
CALCIUM: 9.3 mg/dL (ref 8.9–10.3)
CO2: 29 mmol/L (ref 22–32)
CREATININE: 7.16 mg/dL — AB (ref 0.44–1.00)
Chloride: 90 mmol/L — ABNORMAL LOW (ref 98–111)
GFR calc non Af Amer: 5 mL/min — ABNORMAL LOW (ref >60)
GFR, EST AFRICAN AMERICAN: 6 mL/min — AB (ref >60)
GLUCOSE: 100 mg/dL — AB (ref 70–99)
Potassium: 5.6 mmol/L — ABNORMAL HIGH (ref 3.5–5.1)
SODIUM: 135 mmol/L (ref 135–145)
Total Bilirubin: 0.6 mg/dL (ref 0.3–1.2)
Total Protein: 7 g/dL (ref 6.5–8.1)

## 2018-12-13 LAB — TROPONIN I: Troponin I: 0.03 ng/mL (ref <0.03)

## 2018-12-13 LAB — BRAIN NATRIURETIC PEPTIDE: B Natriuretic Peptide: 3487.5 pg/mL — ABNORMAL HIGH (ref 0.0–100.0)

## 2018-12-13 MED ORDER — CALCITRIOL 0.5 MCG PO CAPS
1.5000 ug | ORAL_CAPSULE | ORAL | Status: DC
  Administered 2018-12-14 (×2): 1.5 ug via ORAL
  Filled 2018-12-13: qty 3

## 2018-12-13 MED ORDER — DOXAZOSIN MESYLATE 8 MG PO TABS: 8.0000 mg | ORAL_TABLET | Freq: Every day | ORAL | Status: DC

## 2018-12-13 MED ORDER — IPRATROPIUM-ALBUTEROL 0.5-2.5 (3) MG/3ML IN SOLN: 3.0000 mL | RESPIRATORY_TRACT | Status: DC | PRN

## 2018-12-13 MED ORDER — CINACALCET HCL 30 MG PO TABS
60.0000 mg | ORAL_TABLET | ORAL | Status: DC
  Administered 2018-12-14: 60 mg via ORAL
  Filled 2018-12-13 (×2): qty 2

## 2018-12-13 MED ORDER — AMLODIPINE BESYLATE 10 MG PO TABS
10.0000 mg | ORAL_TABLET | Freq: Every day | ORAL | Status: DC
  Filled 2018-12-13 (×2): qty 1

## 2018-12-13 MED ORDER — METHOCARBAMOL 500 MG PO TABS
500.0000 mg | ORAL_TABLET | Freq: Two times a day (BID) | ORAL | Status: DC
  Administered 2018-12-14 (×2): 500 mg via ORAL
  Filled 2018-12-13 (×2): qty 1

## 2018-12-13 MED ORDER — AMLODIPINE BESYLATE 5 MG PO TABS: 10.0000 mg | ORAL_TABLET | Freq: Every day | ORAL | Status: DC

## 2018-12-13 MED ORDER — PENTAFLUOROPROP-TETRAFLUOROETH EX AERO
1.0000 "application " | INHALATION_SPRAY | CUTANEOUS | Status: DC | PRN
  Filled 2018-12-13: qty 116

## 2018-12-13 MED ORDER — SIMVASTATIN 20 MG PO TABS
10.0000 mg | ORAL_TABLET | Freq: Every day | ORAL | Status: DC
  Administered 2018-12-14: 10 mg via ORAL
  Filled 2018-12-13: qty 1

## 2018-12-13 MED ORDER — FERRIC CITRATE 1 GM 210 MG(FE) PO TABS
210.0000 mg | ORAL_TABLET | Freq: Three times a day (TID) | ORAL | Status: DC
  Administered 2018-12-13 – 2018-12-14 (×3): 210 mg via ORAL
  Filled 2018-12-13 (×5): qty 1

## 2018-12-13 MED ORDER — DOXAZOSIN MESYLATE 2 MG PO TABS
8.0000 mg | ORAL_TABLET | Freq: Every day | ORAL | Status: DC
  Administered 2018-12-14: 8 mg via ORAL
  Filled 2018-12-13: qty 4

## 2018-12-13 MED ORDER — SODIUM CHLORIDE 0.9 % IV SOLN: 100.0000 mL | INTRAVENOUS | Status: DC | PRN

## 2018-12-13 MED ORDER — LOSARTAN POTASSIUM 50 MG PO TABS
100.0000 mg | ORAL_TABLET | Freq: Every day | ORAL | Status: DC
  Administered 2018-12-14: 100 mg via ORAL
  Filled 2018-12-13: qty 2

## 2018-12-13 MED ORDER — ALPRAZOLAM 0.25 MG PO TABS: 0.2500 mg | ORAL_TABLET | Freq: Every day | ORAL | Status: DC | PRN

## 2018-12-13 MED ORDER — CALCIUM CARBONATE ANTACID 500 MG PO CHEW
1.0000 | CHEWABLE_TABLET | Freq: Three times a day (TID) | ORAL | Status: DC
  Administered 2018-12-14 (×3): 200 mg via ORAL
  Filled 2018-12-13 (×3): qty 1

## 2018-12-13 MED ORDER — LABETALOL HCL 200 MG PO TABS: 300.0000 mg | ORAL_TABLET | Freq: Two times a day (BID) | ORAL | Status: DC

## 2018-12-13 MED ORDER — ACETAMINOPHEN 500 MG PO TABS: 500.0000 mg | ORAL_TABLET | Freq: Four times a day (QID) | ORAL | Status: DC | PRN

## 2018-12-13 MED ORDER — MECLIZINE HCL 25 MG PO TABS: 25.0000 mg | ORAL_TABLET | Freq: Two times a day (BID) | ORAL | Status: DC | PRN

## 2018-12-13 MED ORDER — HYDROCODONE-ACETAMINOPHEN 5-325 MG PO TABS: 1.0000 | ORAL_TABLET | ORAL | Status: DC | PRN

## 2018-12-13 MED ORDER — CINACALCET HCL 30 MG PO TABS: 30.0000 mg | ORAL_TABLET | ORAL | Status: DC

## 2018-12-13 MED ORDER — ALLOPURINOL 300 MG PO TABS
150.0000 mg | ORAL_TABLET | Freq: Every day | ORAL | Status: DC
  Administered 2018-12-14: 150 mg via ORAL
  Filled 2018-12-13: qty 1

## 2018-12-13 MED ORDER — CHLORHEXIDINE GLUCONATE CLOTH 2 % EX PADS: 6.0000 | MEDICATED_PAD | Freq: Every day | CUTANEOUS | Status: DC

## 2018-12-13 MED ORDER — HEPARIN SODIUM (PORCINE) 1000 UNIT/ML DIALYSIS
3000.0000 [IU] | Freq: Once | INTRAMUSCULAR | Status: DC
  Filled 2018-12-13: qty 3

## 2018-12-13 MED ORDER — LABETALOL HCL 300 MG PO TABS
300.0000 mg | ORAL_TABLET | Freq: Two times a day (BID) | ORAL | Status: DC
  Administered 2018-12-14 (×2): 300 mg via ORAL
  Filled 2018-12-13 (×2): qty 1

## 2018-12-13 MED ORDER — LIDOCAINE-PRILOCAINE 2.5-2.5 % EX CREA
1.0000 "application " | TOPICAL_CREAM | CUTANEOUS | Status: DC | PRN
  Filled 2018-12-13: qty 5

## 2018-12-13 MED ORDER — LIDOCAINE HCL (PF) 1 % IJ SOLN: 5.0000 mL | INTRAMUSCULAR | Status: DC | PRN

## 2018-12-13 MED ORDER — LANTHANUM CARBONATE 500 MG PO CHEW: 1000.0000 mg | CHEWABLE_TABLET | Freq: Three times a day (TID) | ORAL | Status: DC

## 2018-12-13 MED ORDER — LOSARTAN POTASSIUM 50 MG PO TABS: 100.0000 mg | ORAL_TABLET | Freq: Every day | ORAL | Status: DC

## 2018-12-13 MED ORDER — HEPARIN SODIUM (PORCINE) 5000 UNIT/ML IJ SOLN
5000.0000 [IU] | Freq: Three times a day (TID) | INTRAMUSCULAR | Status: DC
  Administered 2018-12-13 – 2018-12-14 (×2): 5000 [IU] via SUBCUTANEOUS
  Filled 2018-12-13 (×2): qty 1

## 2018-12-13 MED ORDER — CLOPIDOGREL BISULFATE 75 MG PO TABS: 75.0000 mg | ORAL_TABLET | Freq: Every day | ORAL | Status: DC

## 2018-12-13 MED ORDER — LACTULOSE 10 GM/15ML PO SOLN: 20.0000 g | Freq: Every day | ORAL | Status: DC | PRN

## 2018-12-13 NOTE — H&P (Signed)
History and Physical    [name] XAJ:[id number] DOB: 1951/04/01 DOA: 12/13/2018  I have briefly reviewed the patient's prior medical records in [place]  PCP: [name], MD  Patient coming from: Home  Chief Complaint: Shortness of breath, fever and chills  HPI: [name] is a 67 y.o. female with medical history significant of end-stage renal disease on hemodialysis MWF, hypertension, hyperlipidemia, prior CVA, diastolic CHF who presents to the hospital with chief complaint of shortness of breath as well as fever chills.  This is been going on for about a week, also has some GI symptoms with nausea vomiting and diarrhea which have been worse today.  She is compliant with her dialysis, and was going to dialysis today however she was feeling so bad that came to the emergency room.  She denies any chest pain.  She denies any lightheadedness or dizziness, however reports ambulation issues due to balance following her stroke in 2017.  No new focal weakness or numbness.  She also reports extreme fatigue over the last week.  She does not recall in particular any sick contacts however thinks that may there may have been sick people during dialysis.  ED Course: In the emergency room her vital signs are stable, she is slightly tachypneic with respiratory rate of 25, normotensive, sats are in the upper 80s low 90s on room air and she was placed on 2 L nasal cannula. Blood work reveals a creatinine of 7.1, potassium of 5.6.  BNP is elevated at 3487.  Chest x-ray with cardiomegaly and evidence of pulmonary edema.  Nephrology was consulted, will dialyze patient this evening, but would like her admitted for overnight observation postdialysis  Review of Systems: As per HPI otherwise 10 point review of systems negative.   Past Medical History:  Diagnosis Date  . Adenomatous polyp of colon 10/2010, 2006, 2015  . Anemia in CKD (chronic kidney disease) 11/07/2012   s/p blood  transfusion.   . Arthritis   . CHF (congestive heart failure) ([place])   . Constipation   . Depression with anxiety   . Dialysis patient ([place])    Mon-Wed-Fri  . Diverticula, colon   . 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  Had deceased donor kidney transplant in 1996.  Had some early rejection then stable function for years, then had slow decline of function and went back on hemodialysis in 2012.  Gets HD TTS schedule at [facility] still using L forearm AVF.     Marland Kitchen GERD (gastroesophageal reflux disease)   . Headache   . Hypercholesterolemia   . Hyperlipidemia   . Hypertension   . Neuromuscular disorder (HCC)    neuropathy hand and legs  . Osteoporosis   . Pneumonia   . 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 Stroke [facility]) 11/2015   TIA  . Stroke-like symptoms 11/2015  . Weight loss, unintentional     Past Surgical History:  Procedure Laterality Date  . AV FISTULA PLACEMENT     for dialysis  . AV FISTULA PLACEMENT Left 11/22/2015   Procedure: ARTERIOVENOUS (AV) FISTULA CREATION-LEFT BRACHIOCEPHALIC;  Surgeon: Serafina Mitchell, MD;  Location: [place];  Service: Vascular;  Laterality: Left;  . BACK SURGERY    . CERVICAL FUSION    . CHOLECYSTECTOMY  12/02/2012   Procedure: LAPAROSCOPIC CHOLECYSTECTOMY WITH INTRAOPERATIVE CHOLANGIOGRAM;  Surgeon: Edward Jolly, MD;  Location: [facility] OR;  Service: General;  Laterality: N/A;  . EYE SURGERY Bilateral    cataract surgery  . EYE SURGERY Left 2019   laser  . HEMATOMA EVACUATION Left 12/24/2016   Procedure: EVACUATION HEMATOMA LEFT UPPER ARM;  Surgeon: Waynetta Sandy, MD;  Location: [place];  Service: Vascular;  Laterality: Left;  . I&D EXTREMITY Left 12/31/2016   Procedure: IRRIGATION AND DEBRIDEMENT EXTREMITY;  Surgeon: Angelia Mould, MD;  Location: [place];  Service: Vascular;  Laterality: Left;  .  INSERTION OF DIALYSIS CATHETER Right 12/24/2016   Procedure: INSERTION OF DIALYSIS CATHETER;  Surgeon: Waynetta Sandy, MD;  Location: [place];  Service: Vascular;  Laterality: Right;  . INSERTION OF DIALYSIS CATHETER Right 02/04/2017   Procedure: INSERTION OF DIALYSIS CATHETER;  Surgeon: Waynetta Sandy, MD;  Location: [place];  Service: Vascular;  Laterality: Right;  . KIDNEY TRANSPLANT  1996  . PERIPHERAL VASCULAR CATHETERIZATION Left 10/23/2016   Procedure: Fistulagram;  Surgeon: Elam Dutch, MD;  Location: [place] [facility];  Service: Cardiovascular;  Laterality: Left;  . RESECTION OF ARTERIOVENOUS FISTULA ANEURYSM Left 11/22/2015   Procedure: RESECTION OF LEFT RADIOCEPHALIC FISTULA ANEURYSM ;  Surgeon: Serafina Mitchell, MD;  Location: [place];  Service: Vascular;  Laterality: Left;  . REVISON OF ARTERIOVENOUS FISTULA Left 12/22/2016   Procedure: REVISON OF LEFT ARTERIOVENOUS FISTULA;  Surgeon: Waynetta Sandy, MD;  Location: [place];  Service: Vascular;  Laterality: Left;  . REVISON OF ARTERIOVENOUS FISTULA Left 02/04/2017   Procedure: REVISON OF LEFT UPPER ARM ARTERIOVENOUS FISTULA;  Surgeon: Waynetta Sandy, MD;  Location: [place];  Service: Vascular;  Laterality: Left;     reports that she quit smoking about 26 years ago. Her smoking use included cigarettes. She has never used smokeless tobacco. She reports that she does not drink alcohol or use drugs.  Allergies  Allergen Reactions  . Sulfa Antibiotics Other (See Comments)    Both parents allergic-so will not take  . Adhesive [Tape] Itching    Family History  Problem Relation Age of Onset  . Colon cancer Brother   . Cancer Brother   . Coronary artery disease Mother 74  . Hyperlipidemia Mother   . Hypertension Mother   . Stroke Maternal Aunt   . Esophageal cancer Neg Hx   . Stomach cancer Neg Hx   . Rectal cancer Neg Hx     Prior to Admission medications   Medication Sig Start Date End Date  Taking? Authorizing Provider  acetaminophen (TYLENOL) 500 MG tablet Take 1 tablet (500 mg total) by mouth every 6 (six) hours as needed. 09/05/18   Law, Bea Graff, PA-C  allopurinol (ZYLOPRIM) 300 MG tablet Take 150 mg by mouth at bedtime.    [provider]  ALPRAZolam Duanne Moron) 0.25 MG tablet Take 0.25 mg by mouth daily as needed for anxiety.     [provider]  amLODipine (NORVASC) 10 MG tablet 10 mg at bedtime. 01/26/18   [provider]  B Complex-C-Folic Acid (DIALYVITE PO) Take 1 tablet by mouth daily.    [provider]  calcium carbonate (TUMS - DOSED IN MG ELEMENTAL CALCIUM) 500 MG chewable tablet Chew 1 tablet by mouth 4 (four) times daily.    [provider]  clopidogrel (PLAVIX) 75 MG tablet Take 1 tablet (75 mg total) by mouth daily. 02/26/17   Velvet Bathe, MD  diclofenac sodium (VOLTAREN) 1 % GEL Apply 2  g topically 4 (four) times daily. 09/30/17   Pisciotta, Elmyra Ricks, PA-C  doxazosin (CARDURA) 8 MG tablet Take 1 tablet (8 mg total) by mouth at bedtime. 12/26/16   Alvia Grove, PA-C  HYDROcodone-acetaminophen (NORCO/VICODIN) 5-325 MG tablet Take 1-2 tablets by mouth every 6 hours as needed for pain and/or cough. 09/30/17   Pisciotta, Elmyra Ricks, PA-C  labetalol (NORMODYNE) 300 MG tablet Take 300 mg by mouth 2 (two) times daily.    [provider]  lactulose (CHRONULAC) 10 GM/15ML solution Take 20 g by mouth daily as needed for mild constipation.    [provider]  lanthanum (FOSRENOL) 1000 MG chewable tablet Chew 1,000 mg by mouth 3 (three) times daily.     [provider]  losartan (COZAAR) 100 MG tablet Take 100 mg by mouth at bedtime.     [provider]  meclizine (ANTIVERT) 25 MG tablet Take 1 tablet (25 mg total) by mouth 2 (two) times daily as needed for dizziness. 06/24/16   Horton, Barbette Hair, MD  methocarbamol (ROBAXIN) 500 MG tablet Take 1 tablet (500 mg total) by mouth 2 (two) times daily.  09/05/18   Law, Bea Graff, PA-C  ondansetron (ZOFRAN) 8 MG tablet TK 1 T PO  BID FOR 4 DAYS 01/20/18   [provider]  SENSIPAR 30 MG tablet Take 30 mg by mouth every Monday, Wednesday, and Friday with hemodialysis.  08/20/16   [provider]  simvastatin (ZOCOR) 20 MG tablet Take 1 tablet (20 mg total) by mouth at bedtime. Patient taking differently: Take 10 mg by mouth at bedtime.  02/26/17   Velvet Bathe, MD  zolpidem (AMBIEN) 10 MG tablet take 10 mg in the evening    [provider]    Physical Exam: Vitals:   12/13/18 1415 12/13/18 1430 12/13/18 1445 12/13/18 1530  BP: (!) 183/98 (!) 166/88 (!) 166/91 (!) 169/87  Pulse: 85 84 83 82  Resp: (!) 23 (!) 21 (!) 23 18  SpO2: 92% 90% 92% 96%  Weight:      Height:          Constitutional: She is slightly tachypneic but in no apparent significant distress Eyes: PERRL, lids and conjunctivae normal ENMT: Mucous membranes are moist.  Neck: normal, supple, no masses, no thyromegaly Respiratory: Shallow breathing, decreased breath sounds at the bases, no wheezing heard.  Increased respiratory effort Cardiovascular: Regular rate and rhythm.  Trace peripheral edema. 2+ pedal pulses.  Abdomen: no tenderness, no masses palpated. Bowel sounds positive.  Musculoskeletal: no clubbing / cyanosis. Normal muscle tone.  Skin: no rashes, lesions, ulcers. No induration Neurologic: CN 2-12 grossly intact. Strength equal Psychiatric: Normal judgment and insight. Alert and oriented x 3. Normal mood.   Labs on Admission: I have personally reviewed following labs and imaging studies  CBC: No results for input(s): WBC, NEUTROABS, HGB, HCT, MCV, PLT in the last 168 hours. Basic Metabolic Panel: Recent Labs  Lab 12/13/18 1141  NA 135  K 5.6*  CL 90*  CO2 29  GLUCOSE 100*  BUN 25*  CREATININE 7.16*  CALCIUM 9.3   GFR: Estimated Creatinine Clearance: 5.9 mL/min (A) (by C-G formula based on SCr of 7.16 mg/dL (H)). Liver  Function Tests: Recent Labs  Lab 12/13/18 1141  AST 29  ALT 13  ALKPHOS 65  BILITOT 0.6  PROT 7.0  ALBUMIN 3.5   No results for input(s): LIPASE, AMYLASE in the last 168 hours. No results for input(s): AMMONIA in the last 168  hours. Coagulation Profile: No results for input(s): INR, PROTIME in the last 168 hours. Cardiac Enzymes: Recent Labs  Lab 12/13/18 1141  TROPONINI 0.03*   BNP (last 3 results) No results for input(s): PROBNP in the last 8760 hours. HbA1C: No results for input(s): HGBA1C in the last 72 hours. CBG: No results for input(s): GLUCAP in the last 168 hours. Lipid Profile: No results for input(s): CHOL, HDL, LDLCALC, TRIG, CHOLHDL, LDLDIRECT in the last 72 hours. Thyroid Function Tests: No results for input(s): TSH, T4TOTAL, FREET4, T3FREE, THYROIDAB in the last 72 hours. Anemia Panel: No results for input(s): VITAMINB12, FOLATE, FERRITIN, TIBC, IRON, RETICCTPCT in the last 72 hours. Urine analysis:    Component Value Date/Time   COLORURINE YELLOW 01/30/2011 1648   APPEARANCEUR CLOUDY (A) 01/30/2011 1648   LABSPEC 1.016 01/30/2011 1648   PHURINE 5.0 01/30/2011 1648   GLUCOSEU NEGATIVE 10/14/2009 0747   HGBUR SMALL (A) 01/30/2011 1648   BILIRUBINUR NEGATIVE 01/30/2011 1648   KETONESUR NEGATIVE 01/30/2011 1648   PROTEINUR 100 (A) 01/30/2011 1648   UROBILINOGEN 0.2 01/30/2011 1648   NITRITE NEGATIVE 01/30/2011 1648   LEUKOCYTESUR SMALL (A) 01/30/2011 1648     Radiological Exams on Admission: Dg Chest 2 View  Result Date: 12/13/2018 CLINICAL DATA:  Weakness. Shortness of breath.  Hemodialysis. EXAM: CHEST - 2 VIEW COMPARISON:  09/05/2018. FINDINGS: Cardiomegaly. Mild vascular congestion. Calcified tortuous aorta. No consolidation or frank edema. Slight worsening aeration from priors. IMPRESSION: Cardiomegaly with mild vascular congestion. Early edema is not excluded. Electronically Signed   By: Staci Righter M.D.   On: 12/13/2018 12:08    EKG:  Independently reviewed.  Sinus tachycardia  Assessment/Plan Principal Problem:   Volume overload Active Problems:   ESRD (end stage renal disease) (HCC)   Anemia in CKD (chronic kidney disease)   Shortness of breath   HTN (hypertension)   Hyperlipidemia   CHF (congestive heart failure) (HCC)   H/O: CVA (cerebrovascular accident)   Hypoxemia    Acute hypoxic respiratory failure in the setting of early pulmonary edema -Nephrology has been consulted, she will undergo dialysis this evening, will observe postdialysis overnight and discharge home if stable in the morning  URI/viral illness -This is been going on for a week, afebrile here, continue to monitor fever curve.  Chest x-ray without significant infiltrates.  Supportive treatment.  DuoNebs as needed  End-stage renal disease on HD -Nephrology consulted by EDP, she will get dialyzed tonight  Hypertension -Resume home medications  Hyperlipidemia -Resume home medications  History of CVA -Continue home Plavix  Acute on chronic diastolic CHF -Volume status managed by dialysis   DVT prophylaxis: Heparin subcu Code Status: Full code Family Communication: Family at bedside Disposition Plan: Admit to telemetry, Home when ready Consults called: Nephrology   Marzetta Board, MD, PhD [facility] Pager [phone]  If 7PM-7AM, please contact night-coverage [email or web address] Password [facility]  12/13/2018, 3:51 PM

## 2018-12-13 NOTE — ED Provider Notes (Signed)
Emergency Department Provider Note   I have reviewed the triage vital signs and the nursing notes.   HISTORY  Chief Complaint Weakness   HPI [name] is a 67 y.o. female with PMH of ESRD on HD, CHF, HTN, HLD, and prior TIA presents to the emergency department with fatigue, shortness of breath, cough, and subjective fevers.  Patient states that symptoms have been ongoing for the past week.  She arrived to dialysis this morning for treatment and staff sent her here prior to HD.  Her last dialysis was 2 days ago and was a full session.  She denies body aches but states she has felt confused at times.  She does not make urine. No radiation of symptoms or modifying factors.    Past Medical History:  Diagnosis Date  . Adenomatous polyp of colon 10/2010, 2006, 2015  . Anemia in CKD (chronic kidney disease) 11/07/2012   s/p blood transfusion.   . Arthritis   . CHF (congestive heart failure) ([place])   . Constipation   . Depression with anxiety   . Dialysis patient ([place])    Mon-Wed-Fri  . Diverticula, colon   . 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  Had deceased donor kidney transplant in 1996.  Had some early rejection then stable function for years, then had slow decline of function and went back on hemodialysis in 2012.  Gets HD TTS schedule at [facility] on [facility] still using L forearm AVF.     Marland Kitchen GERD (gastroesophageal reflux disease)   . Headache   . Hypercholesterolemia   . Hyperlipidemia   . Hypertension   . Neuromuscular disorder (HCC)    neuropathy hand and legs  . Osteoporosis   . Pneumonia   . 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 Stroke [facility]) 11/2015   TIA  . Stroke-like symptoms 11/2015  . Weight loss, unintentional     Patient Active Problem List   Diagnosis Date Noted  . Hypoxemia 12/13/2018  . Atrial fibrillation ([place]) 03/23/2017  .  Prolonged Q-T interval on ECG 02/24/2017  . Hypokalemia 02/24/2017  . Acute ischemic stroke ([place]) 02/23/2017  . Malnutrition of moderate degree 12/24/2016  . Problem with dialysis access ([place]) 12/21/2016  . Acute ischemic colitis ([place]) 05/16/2016  . Colitis 05/15/2016  . Rectal bleeding 05/15/2016  . Neurologic abnormality 11/19/2015  . Altered mental state 11/19/2015  . Hyperkalemia 11/19/2015  . Anxiety 11/19/2015  . Insomnia 11/19/2015  . ESRD (end stage renal disease) on dialysis ([place])   . Gait instability   . Stroke-like symptom 11/16/2015  . Vestibular neuritis 11/16/2015  . Stroke (cerebrum) ([place]) 11/16/2015  . Dizziness 05/09/2015  . Ataxia 05/09/2015  . H/O: CVA (cerebrovascular accident) 05/09/2015  . Left facial numbness 05/09/2015  . Left leg numbness 05/09/2015  . Hyperlipidemia   . CHF (congestive heart failure) ([place])   . Congestive heart disease ([place])   . Shortness of breath 04/01/2013  . Volume overload 04/01/2013  . HTN (hypertension) 04/01/2013  . Cholecystitis, acute 11/07/2012  . ESRD (end stage renal disease) ([place]) 11/07/2012  . GERD (gastroesophageal reflux disease) 11/07/2012  . Anemia in CKD (chronic kidney disease) 11/07/2012    Past Surgical History:  Procedure Laterality Date  . AV FISTULA PLACEMENT     for dialysis  . AV FISTULA PLACEMENT Left 11/22/2015   Procedure: ARTERIOVENOUS (  AV) FISTULA CREATION-LEFT BRACHIOCEPHALIC;  Surgeon: Serafina Mitchell, MD;  Location: [place];  Service: Vascular;  Laterality: Left;  . BACK SURGERY    . CERVICAL FUSION    . CHOLECYSTECTOMY  12/02/2012   Procedure: LAPAROSCOPIC CHOLECYSTECTOMY WITH INTRAOPERATIVE CHOLANGIOGRAM;  Surgeon: Edward Jolly, MD;  Location: [place];  Service: General;  Laterality: N/A;  . EYE SURGERY Bilateral    cataract surgery  . EYE SURGERY Left 2019   laser  . HEMATOMA EVACUATION Left 12/24/2016   Procedure: EVACUATION HEMATOMA LEFT UPPER ARM;  Surgeon: Waynetta Sandy,  MD;  Location: [place];  Service: Vascular;  Laterality: Left;  . I&D EXTREMITY Left 12/31/2016   Procedure: IRRIGATION AND DEBRIDEMENT EXTREMITY;  Surgeon: Angelia Mould, MD;  Location: [place];  Service: Vascular;  Laterality: Left;  . INSERTION OF DIALYSIS CATHETER Right 12/24/2016   Procedure: INSERTION OF DIALYSIS CATHETER;  Surgeon: Waynetta Sandy, MD;  Location: [place];  Service: Vascular;  Laterality: Right;  . INSERTION OF DIALYSIS CATHETER Right 02/04/2017   Procedure: INSERTION OF DIALYSIS CATHETER;  Surgeon: Waynetta Sandy, MD;  Location: [place];  Service: Vascular;  Laterality: Right;  . KIDNEY TRANSPLANT  1996  . PERIPHERAL VASCULAR CATHETERIZATION Left 10/23/2016   Procedure: Fistulagram;  Surgeon: Elam Dutch, MD;  Location: [facility];  Service: Cardiovascular;  Laterality: Left;  . RESECTION OF ARTERIOVENOUS FISTULA ANEURYSM Left 11/22/2015   Procedure: RESECTION OF LEFT RADIOCEPHALIC FISTULA ANEURYSM ;  Surgeon: Serafina Mitchell, MD;  Location: [place];  Service: Vascular;  Laterality: Left;  . REVISON OF ARTERIOVENOUS FISTULA Left 12/22/2016   Procedure: REVISON OF LEFT ARTERIOVENOUS FISTULA;  Surgeon: Waynetta Sandy, MD;  Location: [place];  Service: Vascular;  Laterality: Left;  . REVISON OF ARTERIOVENOUS FISTULA Left 02/04/2017   Procedure: REVISON OF LEFT UPPER ARM ARTERIOVENOUS FISTULA;  Surgeon: Waynetta Sandy, MD;  Location: [place];  Service: Vascular;  Laterality: Left;    Allergies Sulfa antibiotics and Adhesive [tape]  Family History  Problem Relation Age of Onset  . Colon cancer Brother   . Cancer Brother   . Coronary artery disease Mother 46  . Hyperlipidemia Mother   . Hypertension Mother   . Stroke Maternal Aunt   . Esophageal cancer Neg Hx   . Stomach cancer Neg Hx   . Rectal cancer Neg Hx     Social History Social History   Tobacco Use  . Smoking status: Former Smoker    Types: Cigarettes    Last  attempt to quit: 12/31/1991    Years since quitting: 26.9  . Smokeless tobacco: Never Used  Substance Use Topics  . Alcohol use: No    Alcohol/week: 0.0 standard drinks  . Drug use: No    Review of Systems  Constitutional: Positive subjective fever and fatigue.  Eyes: No visual changes. ENT: No sore throat. Cardiovascular: Denies chest pain. Respiratory: Positive shortness of breath and cough.  Gastrointestinal: No abdominal pain.  No nausea, no vomiting.  No diarrhea.  No constipation. Musculoskeletal: Negative for back pain. Skin: Negative for rash. Neurological: Negative for headaches, focal weakness or numbness.  10-point ROS otherwise negative.  ____________________________________________   PHYSICAL EXAM:  VITAL SIGNS:  EKG Interpretation  Date/Time:  Tuesday December 13 2018 11:31:41 EST Ventricular Rate:  91 PR Interval:    QRS Duration: 95 QT Interval:  393 QTC Calculation: 484 R Axis:   72 Text Interpretation:  Sinus tachycardia Ventricular premature complex  Short PR interval Minimal ST elevation, inferior leads No STEMI. Similar to October 2019 tracing.  Confirmed by Nanda Quinton [phone]) on 12/13/2018 11:34:44 AM        Constitutional: Alert and oriented. Well appearing and in no acute distress. Eyes: Conjunctivae are normal. Head: Atraumatic. Nose: No congestion/rhinnorhea. Mouth/Throat: Mucous membranes are moist.  Neck: No stridor.   Cardiovascular: Normal rate, regular rhythm. Good peripheral circulation. Grossly normal heart sounds. Positive JVP to the mandible.  Respiratory: Slight increased respiratory effort.  No retractions. Lungs diminished at the bases.  Gastrointestinal: Soft and nontender. No distention.  Musculoskeletal: No lower extremity tenderness with trace LE edema bilaterally. No gross deformities of extremities. Neurologic:  Normal speech and language. No gross focal neurologic deficits are appreciated.  Skin:  Skin is warm, dry  and intact. No rash noted.  ____________________________________________   LABS (all labs ordered are listed, but only abnormal results are displayed)  Labs Reviewed  BRAIN NATRIURETIC PEPTIDE - Abnormal; Notable for the following components:      Result Value   B Natriuretic Peptide 3,487.5 (*)    All other components within normal limits  COMPREHENSIVE METABOLIC PANEL - Abnormal; Notable for the following components:   Potassium 5.6 (*)    Chloride 90 (*)    Glucose, Bld 100 (*)    BUN 25 (*)    Creatinine, Ser 7.16 (*)    GFR calc non Af Amer 5 (*)    GFR calc Af Amer 6 (*)    Anion gap 16 (*)    All other components within normal limits  TROPONIN I - Abnormal; Notable for the following components:   Troponin I 0.03 (*)    All other components within normal limits  RENAL FUNCTION PANEL - Abnormal; Notable for the following components:   Sodium 134 (*)    Potassium 5.6 (*)    Chloride 91 (*)    BUN 24 (*)    Creatinine, Ser 7.65 (*)    Phosphorus 5.6 (*)    Albumin 3.1 (*)    GFR calc non Af Amer 5 (*)    GFR calc Af Amer 6 (*)    All other components within normal limits  CBC - Abnormal; Notable for the following components:   RBC 3.14 (*)    Hemoglobin 9.4 (*)    HCT 29.6 (*)    RDW 17.0 (*)    All other components within normal limits  INFLUENZA PANEL BY PCR (TYPE A & B)  CBC WITH DIFFERENTIAL/PLATELET  CBC WITH DIFFERENTIAL/PLATELET  RENAL FUNCTION PANEL  RENAL FUNCTION PANEL   ____________________________________________  EKG   EKG Interpretation  Date/Time:  Tuesday December 13 2018 11:31:41 EST Ventricular Rate:  91 PR Interval:    QRS Duration: 95 QT Interval:  393 QTC Calculation: 484 R Axis:   72 Text Interpretation:  Sinus tachycardia Ventricular premature complex Short PR interval Minimal ST elevation, inferior leads No STEMI. Similar to October 2019 tracing.  Confirmed by Nanda Quinton [phone]) on 12/13/2018 11:34:44 AM        ____________________________________________  RADIOLOGY  Dg Chest 2 View  Result Date: 12/13/2018 CLINICAL DATA:  Weakness. Shortness of breath.  Hemodialysis. EXAM: CHEST - 2 VIEW COMPARISON:  09/05/2018. FINDINGS: Cardiomegaly. Mild vascular congestion. Calcified tortuous aorta. No consolidation or frank edema. Slight worsening aeration from priors. IMPRESSION: Cardiomegaly with mild vascular congestion. Early edema is not excluded. Electronically Signed   By: Staci Righter M.D.   On: 12/13/2018 12:08  ____________________________________________   PROCEDURES  Procedure(s) performed:   Procedures  CRITICAL CARE Performed by: Margette Fast Total critical care time: 35 minutes Critical care time was exclusive of separately billable procedures and treating other patients. Critical care was necessary to treat or prevent imminent or life-threatening deterioration. Critical care was time spent personally by me on the following activities: development of treatment plan with patient and/or surrogate as well as nursing, discussions with consultants, evaluation of patient's response to treatment, examination of patient, obtaining history from patient or surrogate, ordering and performing treatments and interventions, ordering and review of laboratory studies, ordering and review of radiographic studies, pulse oximetry and re-evaluation of patient's condition.  Nanda Quinton, MD Emergency Medicine  ____________________________________________   INITIAL IMPRESSION / ASSESSMENT AND PLAN / ED COURSE  Pertinent labs & imaging results that were available during my care of the patient were reviewed by me and considered in my medical decision making (see chart for details).  Patient with shortness of breath, cough, subjective fevers.  Patient has elevated JVP on exam.  Possible pulmonary edema given her exam but patient gives a more indolent course of possible infection.  I will test for  the flu obtain chest x-ray to rule out infiltrate.  With pulmonary edema patient would need dialysis here.  She does have hypoxemia with me in the room.  Patient had an oxygen sat of 84% on room air with good waveform. Started on 2L O2. No indication for BiPAP at this time.   02:40 PM Flu negative. Patient not wanting to wear the supplemental O2. BNP elevated along with potassium. Re-drawing CBC as first sample was refused. Spoke with Nephrology who will come down to evaluate for HD and possibly discharge home afterwards, which is the patient's preference.   03:00 PM Nephrology has evaluated her. They would prefer HD as inpatient to ensure that symptoms resolve once HD is complete. Patient has had HD this past week but symptoms have persisted. Will contact Hospitalist for admit.   Discussed patient's case with Hospitalist to request admission. Patient and family (if present) updated with plan. Care transferred to Hospitalist service.  I reviewed all nursing notes, vitals, pertinent old records, EKGs, labs, imaging (as available).  ____________________________________________  FINAL CLINICAL IMPRESSION(S) / ED DIAGNOSES  Final diagnoses:  Hypoxemia  Hypervolemia, unspecified hypervolemia type  Weakness     MEDICATIONS GIVEN DURING THIS VISIT:  Medications  Chlorhexidine Gluconate Cloth 2 % PADS 6 each (has no administration in time range)  pentafluoroprop-tetrafluoroeth (GEBAUERS) aerosol 1 application (has no administration in time range)  lidocaine (PF) (XYLOCAINE) 1 % injection 5 mL (has no administration in time range)  lidocaine-prilocaine (EMLA) cream 1 application (has no administration in time range)  0.9 %  sodium chloride infusion (has no administration in time range)  0.9 %  sodium chloride infusion (has no administration in time range)  heparin injection 3,000 Units (has no administration in time range)  calcitRIOL (ROCALTROL) capsule 1.5 mcg (has no administration in time  range)  cinacalcet (SENSIPAR) tablet 60 mg (has no administration in time range)  ferric citrate (AURYXIA) tablet 210 mg (has no administration in time range)    Note:  This document was prepared using Dragon voice recognition software and may include unintentional dictation errors.  Nanda Quinton, MD Emergency Medicine    Long, [facility], MD 12/13/18 [phone]

## 2018-12-13 NOTE — Consult Note (Signed)
[facility] Renal Consultation Note    Indication for Consultation:  Management of ESRD/hemodialysis; anemia, hypertension/volume and secondary hyperparathyroidism PCP: Dr. Carney Living  HPI: [name] is a 67 y.o. female with ESRD on hemodialysis MWF at [facility]. PMH significant for failed renal transplant, HTN, MGUS, diastolic HF, CVA, AOCD, SHPT. Last HD 12/11/2018 ran full treatment, left 0.7 kg under OP EDW. She is compliant with dialysis prescription.   Patient presented to ED today with C/O SOB, weakness, fever, chills and cough for about 5 days. She says today she was so weak, she didn't think she could go to dialysis. CXR shows cardiomegaly with mild vascular congestion. Na 135 K+ 5.6 BS 100 SCr 7.16 Ca 9.3. Influenza negative. She says she has been drinking more fluid than usual trying to ameliorate cold symptoms. She is being admitted as observation patient for acute respiratory failure/ monitoring post HD. O2 Sats 88% on RA, BP 183/98  Past Medical History:  Diagnosis Date  . Adenomatous polyp of colon 10/2010, 2006, 2015  . Anemia in CKD (chronic kidney disease) 11/07/2012   s/p blood transfusion.   . Arthritis   . CHF (congestive heart failure) ([place])   . Constipation   . Depression with anxiety   . Dialysis patient ([place])    Mon-Wed-Fri  . Diverticula, colon   . 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  Had deceased donor kidney transplant in 1996.  Had some early rejection then stable function for years, then had slow decline of function and went back on hemodialysis in 2012.  Gets HD TTS schedule at [facility] on [facility] still using L forearm AVF.     Marland Kitchen GERD (gastroesophageal reflux disease)   . Headache   . Hypercholesterolemia   . Hyperlipidemia   . Hypertension   . Neuromuscular disorder (HCC)    neuropathy hand and legs  . Osteoporosis   .  Pneumonia   . 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 Stroke [facility]) 11/2015   TIA  . Stroke-like symptoms 11/2015  . Weight loss, unintentional    Past Surgical History:  Procedure Laterality Date  . AV FISTULA PLACEMENT     for dialysis  . AV FISTULA PLACEMENT Left 11/22/2015   Procedure: ARTERIOVENOUS (AV) FISTULA CREATION-LEFT BRACHIOCEPHALIC;  Surgeon: Serafina Mitchell, MD;  Location: [place];  Service: Vascular;  Laterality: Left;  . BACK SURGERY    . CERVICAL FUSION    . CHOLECYSTECTOMY  12/02/2012   Procedure: LAPAROSCOPIC CHOLECYSTECTOMY WITH INTRAOPERATIVE CHOLANGIOGRAM;  Surgeon: Edward Jolly, MD;  Location: [place];  Service: General;  Laterality: N/A;  . EYE SURGERY Bilateral    cataract surgery  . EYE SURGERY Left 2019   laser  . HEMATOMA EVACUATION Left 12/24/2016   Procedure: EVACUATION HEMATOMA LEFT UPPER ARM;  Surgeon: Waynetta Sandy, MD;  Location: [place];  Service: Vascular;  Laterality: Left;  . I&D EXTREMITY Left 12/31/2016   Procedure: IRRIGATION AND DEBRIDEMENT EXTREMITY;  Surgeon: Angelia Mould, MD;  Location: [place];  Service: Vascular;  Laterality: Left;  . INSERTION OF DIALYSIS CATHETER Right 12/24/2016   Procedure: INSERTION OF DIALYSIS CATHETER;  Surgeon: Waynetta Sandy, MD;  Location: [place];  Service: Vascular;  Laterality: Right;  . INSERTION OF DIALYSIS CATHETER Right 02/04/2017   Procedure: INSERTION OF DIALYSIS CATHETER;  Surgeon:  Waynetta Sandy, MD;  Location: [place];  Service: Vascular;  Laterality: Right;  . KIDNEY TRANSPLANT  1996  . PERIPHERAL VASCULAR CATHETERIZATION Left 10/23/2016   Procedure: Fistulagram;  Surgeon: Elam Dutch, MD;  Location: [place] CV LAB;  Service: Cardiovascular;  Laterality: Left;  . RESECTION OF ARTERIOVENOUS FISTULA ANEURYSM Left 11/22/2015   Procedure: RESECTION OF LEFT RADIOCEPHALIC FISTULA ANEURYSM ;  Surgeon: Serafina Mitchell, MD;  Location: [place];   Service: Vascular;  Laterality: Left;  . REVISON OF ARTERIOVENOUS FISTULA Left 12/22/2016   Procedure: REVISON OF LEFT ARTERIOVENOUS FISTULA;  Surgeon: Waynetta Sandy, MD;  Location: [place];  Service: Vascular;  Laterality: Left;  . REVISON OF ARTERIOVENOUS FISTULA Left 02/04/2017   Procedure: REVISON OF LEFT UPPER ARM ARTERIOVENOUS FISTULA;  Surgeon: Waynetta Sandy, MD;  Location: [facility] OR;  Service: Vascular;  Laterality: Left;   Family History  Problem Relation Age of Onset  . Colon cancer Brother   . Cancer Brother   . Coronary artery disease Mother 41  . Hyperlipidemia Mother   . Hypertension Mother   . Stroke Maternal Aunt   . Esophageal cancer Neg Hx   . Stomach cancer Neg Hx   . Rectal cancer Neg Hx    Social History:  reports that she quit smoking about 26 years ago. Her smoking use included cigarettes. She has never used smokeless tobacco. She reports that she does not drink alcohol or use drugs. Allergies  Allergen Reactions  . Sulfa Antibiotics Other (See Comments)    Both parents allergic-so will not take  . Adhesive [Tape] Itching   Prior to Admission medications   Medication Sig Start Date End Date Taking? Authorizing Provider  acetaminophen (TYLENOL) 500 MG tablet Take 1 tablet (500 mg total) by mouth every 6 (six) hours as needed. 09/05/18   Law, Bea Graff, PA-C  allopurinol (ZYLOPRIM) 300 MG tablet Take 150 mg by mouth at bedtime.    [provider]  ALPRAZolam Duanne Moron) 0.25 MG tablet Take 0.25 mg by mouth daily as needed for anxiety.     [provider]  amLODipine (NORVASC) 10 MG tablet 10 mg at bedtime. 01/26/18   [provider]  B Complex-C-Folic Acid (DIALYVITE PO) Take 1 tablet by mouth daily.    [provider]  calcium carbonate (TUMS - DOSED IN MG ELEMENTAL CALCIUM) 500 MG chewable tablet Chew 1 tablet by mouth 4 (four) times daily.    [provider]  clopidogrel (PLAVIX) 75 MG tablet Take 1 tablet  (75 mg total) by mouth daily. 02/26/17   Velvet Bathe, MD  diclofenac sodium (VOLTAREN) 1 % GEL Apply 2 g topically 4 (four) times daily. 09/30/17   Pisciotta, Elmyra Ricks, PA-C  doxazosin (CARDURA) 8 MG tablet Take 1 tablet (8 mg total) by mouth at bedtime. 12/26/16   Alvia Grove, PA-C  HYDROcodone-acetaminophen (NORCO/VICODIN) 5-325 MG tablet Take 1-2 tablets by mouth every 6 hours as needed for pain and/or cough. 09/30/17   Pisciotta, Elmyra Ricks, PA-C  labetalol (NORMODYNE) 300 MG tablet Take 300 mg by mouth 2 (two) times daily.    [provider]  lactulose (CHRONULAC) 10 GM/15ML solution Take 20 g by mouth daily as needed for mild constipation.    [provider]  lanthanum (FOSRENOL) 1000 MG chewable tablet Chew 1,000 mg by mouth 3 (three) times daily.     [provider]  losartan (COZAAR) 100 MG tablet Take 100 mg by mouth at bedtime.  [provider]  meclizine (ANTIVERT) 25 MG tablet Take 1 tablet (25 mg total) by mouth 2 (two) times daily as needed for dizziness. 06/24/16   West, Barbette Hair, MD  methocarbamol (ROBAXIN) 500 MG tablet Take 1 tablet (500 mg total) by mouth 2 (two) times daily. 09/05/18   Law, Bea Graff, PA-C  ondansetron (ZOFRAN) 8 MG tablet TK 1 T PO  BID FOR 4 DAYS 01/20/18   [provider]  SENSIPAR 30 MG tablet Take 30 mg by mouth every Monday, Wednesday, and Friday with hemodialysis.  08/20/16   [provider]  simvastatin (ZOCOR) 20 MG tablet Take 1 tablet (20 mg total) by mouth at bedtime. Patient taking differently: Take 10 mg by mouth at bedtime.  02/26/17   Velvet Bathe, MD  zolpidem (AMBIEN) 10 MG tablet take 10 mg in the evening    [provider]   Current Facility-Administered Medications  Medication Dose Route Frequency Provider Last Rate Last Dose  . calcitRIOL (ROCALTROL) capsule 1.5 mcg  1.5 mcg Oral Q M,W,F-HD Valentina Gu, NP      . Derrill Memo ON 12/14/2018] Chlorhexidine Gluconate Cloth 2  % PADS 6 each  6 each Topical Q0600 Valentina Gu, NP       Current Outpatient Medications  Medication Sig Dispense Refill  . acetaminophen (TYLENOL) 500 MG tablet Take 1 tablet (500 mg total) by mouth every 6 (six) hours as needed. 30 tablet 0  . allopurinol (ZYLOPRIM) 300 MG tablet Take 150 mg by mouth at bedtime.    . ALPRAZolam (XANAX) 0.25 MG tablet Take 0.25 mg by mouth daily as needed for anxiety.     Marland Kitchen amLODipine (NORVASC) 10 MG tablet 10 mg at bedtime.  3  . B Complex-C-Folic Acid (DIALYVITE PO) Take 1 tablet by mouth daily.    . calcium carbonate (TUMS - DOSED IN MG ELEMENTAL CALCIUM) 500 MG chewable tablet Chew 1 tablet by mouth 4 (four) times daily.    . clopidogrel (PLAVIX) 75 MG tablet Take 1 tablet (75 mg total) by mouth daily. 30 tablet 0  . diclofenac sodium (VOLTAREN) 1 % GEL Apply 2 g topically 4 (four) times daily. 100 g 0  . doxazosin (CARDURA) 8 MG tablet Take 1 tablet (8 mg total) by mouth at bedtime.    Marland Kitchen HYDROcodone-acetaminophen (NORCO/VICODIN) 5-325 MG tablet Take 1-2 tablets by mouth every 6 hours as needed for pain and/or cough. 7 tablet 0  . labetalol (NORMODYNE) 300 MG tablet Take 300 mg by mouth 2 (two) times daily.    Marland Kitchen lactulose (CHRONULAC) 10 GM/15ML solution Take 20 g by mouth daily as needed for mild constipation.    Marland Kitchen lanthanum (FOSRENOL) 1000 MG chewable tablet Chew 1,000 mg by mouth 3 (three) times daily.     Marland Kitchen losartan (COZAAR) 100 MG tablet Take 100 mg by mouth at bedtime.     . meclizine (ANTIVERT) 25 MG tablet Take 1 tablet (25 mg total) by mouth 2 (two) times daily as needed for dizziness. 30 tablet 0  . methocarbamol (ROBAXIN) 500 MG tablet Take 1 tablet (500 mg total) by mouth 2 (two) times daily. 20 tablet 0  . ondansetron (ZOFRAN) 8 MG tablet TK 1 T PO  BID FOR 4 DAYS  0  . SENSIPAR 30 MG tablet Take 30 mg by mouth every Monday, Wednesday, and Friday with hemodialysis.     Marland Kitchen simvastatin (ZOCOR) 20 MG tablet Take 1 tablet (20 mg total) by  mouth at  bedtime. (Patient taking differently: Take 10 mg by mouth at bedtime. ) 30 tablet 0  . zolpidem (AMBIEN) 10 MG tablet take 10 mg in the evening     Labs: Basic Metabolic Panel: Recent Labs  Lab 12/13/18 1141  NA 135  K 5.6*  CL 90*  CO2 29  GLUCOSE 100*  BUN 25*  CREATININE 7.16*  CALCIUM 9.3   Liver Function Tests: Recent Labs  Lab 12/13/18 1141  AST 29  ALT 13  ALKPHOS 65  BILITOT 0.6  PROT 7.0  ALBUMIN 3.5   No results for input(s): LIPASE, AMYLASE in the last 168 hours. No results for input(s): AMMONIA in the last 168 hours. CBC: No results for input(s): WBC, NEUTROABS, HGB, HCT, MCV, PLT in the last 168 hours. Cardiac Enzymes: Recent Labs  Lab 12/13/18 1141  TROPONINI 0.03*   CBG: No results for input(s): GLUCAP in the last 168 hours. Iron Studies: No results for input(s): IRON, TIBC, TRANSFERRIN, FERRITIN in the last 72 hours. Studies/Results: Dg Chest 2 View  Result Date: 12/13/2018 CLINICAL DATA:  Weakness. Shortness of breath.  Hemodialysis. EXAM: CHEST - 2 VIEW COMPARISON:  09/05/2018. FINDINGS: Cardiomegaly. Mild vascular congestion. Calcified tortuous aorta. No consolidation or frank edema. Slight worsening aeration from priors. IMPRESSION: Cardiomegaly with mild vascular congestion. Early edema is not excluded. Electronically Signed   By: Staci Righter M.D.   On: 12/13/2018 12:08    ROS: As per HPI otherwise negative.   Physical Exam: Vitals:   12/13/18 1415 12/13/18 1430 12/13/18 1445 12/13/18 1530  BP: (!) 183/98 (!) 166/88 (!) 166/91 (!) 169/87  Pulse: 85 84 83 82  Resp: (!) 23 (!) 21 (!) 23 18  SpO2: 92% 90% 92% 96%  Weight:      Height:         General: Well developed, well nourished, in no acute distress. Head: Normocephalic, atraumatic, sclera non-icteric, mucus membranes are moist Neck: Supple. JVD not elevated. Lungs: Bilateral breath sounds decreased in bases with few scattered bibasilar crackles upper airway rhonchi,  scattered end expiratory wheezes. Mild WOB Heart: RRR with S1 S2. No murmurs, rubs, or gallops appreciated. Abdomen: Soft, non-tender, non-distended with normoactive bowel sounds. No rebound/guarding. No obvious abdominal masses. M-S:  Strength and tone appear normal for age. Lower extremities:Trace BLE edema Neuro: Alert and oriented X 3. Moves all extremities spontaneously. Psych:  Responds to questions appropriately with a normal affect. Dialysis Access: L AVF + bruit  Dialysis Orders: [facility] MWF 3 hr 45 min 180NRe 350/Autoflow 1.5 54.5 kg 2.0 K/2.0 Ca UFP 4 L AVF  -Heparin 3000 units IV TIW -Sensipar 60mg  PO TIW -Mircera 100 mcg IV q 2 weeks (last dose 11/30/18) -Calcitriol 1.5 mcg PO TIW  Assessment/Plan: 1.  Acute hypoxic respiratory failure/early pulmonary edema-left under OP EDW last treatment. HD today for volume removal. 2.  ESRD -  [facility],[place],[facility] [place]. Missed HD today. K+ 5.6 2.0 K bath. Usual heparin.  3.  Hypertension/volume  - BP elevated, attempt to lower volume as tolerated. Continue amlodipine 10 mg PO q HS, Doxazosin 8 mg PO daily, labetalol 300 mg PO BID, Losartan 100 mg PO daily. Resume home meds.  4.  Anemia  - CBC pending.  5.  Metabolic bone disease -  Ca 9.3. Add RFP to labs. Continue sensipar, binders, VDRA.  6.  Nutrition -Renal diet, renal vit, nepro.   Kathryn H. Owens Shark, NP-C 12/13/2018, 4:23 PM  [name], [name] [phone]  I have seen and examined  this patient and agree with the plan of care. Pt seen on dialysis at 445pm. Coarse BS w/ rales  162/89 UF 3.2L net 400/800 on 3/2.5 bath  Likely will need dialysis again today to help with volume.  Dwana Melena, MD 12/13/2018, 5:54 PM

## 2018-12-13 NOTE — ED Triage Notes (Signed)
Pt BIB [facility] from HD d/t weakness, SOB, w/ productive cough for x1 week, Diarrhea, chills. Hx Pneumonia, HD T,TH,Sat. Pt last HD on Sun

## 2018-12-13 NOTE — ED Notes (Signed)
Family took purse w/ them

## 2018-12-13 NOTE — ED Notes (Signed)
Got patient on the monitor did ekg shown to Dr Laverta Baltimore patient is resting with call bell in reach an d nurse at bedside

## 2018-12-14 ENCOUNTER — Other Ambulatory Visit: Payer: Self-pay

## 2018-12-14 DIAGNOSIS — E785 Hyperlipidemia, unspecified: Secondary | ICD-10-CM | POA: Diagnosis not present

## 2018-12-14 DIAGNOSIS — Z79899 Other long term (current) drug therapy: Secondary | ICD-10-CM | POA: Diagnosis not present

## 2018-12-14 DIAGNOSIS — E8779 Other fluid overload: Secondary | ICD-10-CM | POA: Diagnosis not present

## 2018-12-14 DIAGNOSIS — F418 Other specified anxiety disorders: Secondary | ICD-10-CM | POA: Diagnosis not present

## 2018-12-14 DIAGNOSIS — E8770 Fluid overload, unspecified: Secondary | ICD-10-CM | POA: Diagnosis not present

## 2018-12-14 DIAGNOSIS — Z992 Dependence on renal dialysis: Secondary | ICD-10-CM | POA: Diagnosis not present

## 2018-12-14 DIAGNOSIS — Z8673 Personal history of transient ischemic attack (TIA), and cerebral infarction without residual deficits: Secondary | ICD-10-CM | POA: Diagnosis not present

## 2018-12-14 DIAGNOSIS — Z87891 Personal history of nicotine dependence: Secondary | ICD-10-CM | POA: Diagnosis not present

## 2018-12-14 DIAGNOSIS — E7800 Pure hypercholesterolemia, unspecified: Secondary | ICD-10-CM | POA: Diagnosis not present

## 2018-12-14 DIAGNOSIS — R0602 Shortness of breath: Secondary | ICD-10-CM | POA: Diagnosis present

## 2018-12-14 DIAGNOSIS — I132 Hypertensive heart and chronic kidney disease with heart failure and with stage 5 chronic kidney disease, or end stage renal disease: Secondary | ICD-10-CM | POA: Diagnosis not present

## 2018-12-14 DIAGNOSIS — T8612 Kidney transplant failure: Secondary | ICD-10-CM | POA: Diagnosis not present

## 2018-12-14 DIAGNOSIS — G709 Myoneural disorder, unspecified: Secondary | ICD-10-CM | POA: Diagnosis not present

## 2018-12-14 DIAGNOSIS — N2581 Secondary hyperparathyroidism of renal origin: Secondary | ICD-10-CM | POA: Diagnosis not present

## 2018-12-14 DIAGNOSIS — D631 Anemia in chronic kidney disease: Secondary | ICD-10-CM | POA: Diagnosis not present

## 2018-12-14 DIAGNOSIS — R509 Fever, unspecified: Secondary | ICD-10-CM | POA: Diagnosis present

## 2018-12-14 DIAGNOSIS — R197 Diarrhea, unspecified: Secondary | ICD-10-CM | POA: Diagnosis present

## 2018-12-14 DIAGNOSIS — I5033 Acute on chronic diastolic (congestive) heart failure: Secondary | ICD-10-CM | POA: Diagnosis not present

## 2018-12-14 DIAGNOSIS — N186 End stage renal disease: Secondary | ICD-10-CM | POA: Diagnosis not present

## 2018-12-14 DIAGNOSIS — J9601 Acute respiratory failure with hypoxia: Secondary | ICD-10-CM | POA: Diagnosis not present

## 2018-12-14 LAB — COMPREHENSIVE METABOLIC PANEL
ALT: 10 U/L (ref 0–44)
AST: 23 U/L (ref 15–41)
Albumin: 3.2 g/dL — ABNORMAL LOW (ref 3.5–5.0)
Alkaline Phosphatase: 59 U/L (ref 38–126)
Anion gap: 14 (ref 5–15)
BUN: 17 mg/dL (ref 8–23)
CO2: 28 mmol/L (ref 22–32)
Calcium: 8.9 mg/dL (ref 8.9–10.3)
Chloride: 93 mmol/L — ABNORMAL LOW (ref 98–111)
Creatinine, Ser: 4.48 mg/dL — ABNORMAL HIGH (ref 0.44–1.00)
GFR calc Af Amer: 11 mL/min — ABNORMAL LOW (ref >60)
GFR calc non Af Amer: 9 mL/min — ABNORMAL LOW (ref >60)
GLUCOSE: 105 mg/dL — AB (ref 70–99)
Potassium: 4.5 mmol/L (ref 3.5–5.1)
SODIUM: 135 mmol/L (ref 135–145)
Total Bilirubin: 1.2 mg/dL (ref 0.3–1.2)
Total Protein: 7 g/dL (ref 6.5–8.1)

## 2018-12-14 LAB — CBC
HCT: 31.2 % — ABNORMAL LOW (ref 36.0–46.0)
Hemoglobin: 10 g/dL — ABNORMAL LOW (ref 12.0–15.0)
MCH: 30.4 pg (ref 26.0–34.0)
MCHC: 32.1 g/dL (ref 30.0–36.0)
MCV: 94.8 fL (ref 80.0–100.0)
Platelets: 183 10*3/uL (ref 150–400)
RBC: 3.29 MIL/uL — ABNORMAL LOW (ref 3.87–5.11)
RDW: 16.9 % — ABNORMAL HIGH (ref 11.5–15.5)
WBC: 6.6 10*3/uL (ref 4.0–10.5)
nRBC: 0 % (ref 0.0–0.2)

## 2018-12-14 LAB — HIV ANTIBODY (ROUTINE TESTING W REFLEX): HIV Screen 4th Generation wRfx: NONREACTIVE

## 2018-12-14 LAB — MRSA PCR SCREENING: MRSA by PCR: NEGATIVE

## 2018-12-14 MED ORDER — CALCITRIOL 0.5 MCG PO CAPS
ORAL_CAPSULE | ORAL | Status: AC
  Administered 2018-12-14: 1.5 ug via ORAL
  Filled 2018-12-14: qty 3

## 2018-12-14 MED ORDER — CHLORHEXIDINE GLUCONATE CLOTH 2 % EX PADS: 6.0000 | MEDICATED_PAD | Freq: Every day | CUTANEOUS | Status: DC

## 2018-12-14 MED ORDER — LOSARTAN POTASSIUM 50 MG PO TABS: 50.0000 mg | ORAL_TABLET | Freq: Every day | ORAL | 0 refills | Status: DC

## 2018-12-14 MED ORDER — CINACALCET HCL 30 MG PO TABS
ORAL_TABLET | ORAL | Status: AC
  Administered 2018-12-14: 60 mg via ORAL
  Filled 2018-12-14: qty 2

## 2018-12-14 NOTE — Progress Notes (Addendum)
[facility] Progress Note   Dialysis Orders: [place] MWF 3 hr 45 min 180NRe 350/Autoflow 1.5 54.5 kg 2.0 K/2.0 Ca UFP 4 L AVF  -Heparin 3000 units IV TIW -Sensipar 60mg  PO TIW -Mircera 100 mcg IV q 2 weeks (last dose 11/30/18) -Calcitriol 1.5 mcg PO TIW Assessment/Plan: 1. Acute hypoxic respiratory failure/early pulmonary edema-left under OP EDW last treatment. Post HD wt 51.4 - much lower than outpt edw 2. ESRD - plan HD today to keep on schedule (3 hr only)  and lower volume more - didn't have standing wts yesterday - also output unit having scale issues skewing readings - will have standing wt pre and post today.  orthostatic BP - probably needs LESS BP meds to be able to UF volume- not clear why BFR is at 350 ^ to 400 for better clearances 3. Hypertension/volume  - BP better - net UF 3 L - no BP drop during HD yesterday 4. Anemia  - hgb 10.1 -  redose ESA today 5. Metabolic bone disease -  Ca/P ok on [place], PA-C [place] [phone] 12/14/2018,9:46 AM  LOS: 0 days   I have seen and examined this patient and agree with the plan of care. Appears more comfortable today but getting extra treatment today to get back on outpt regimen. Pt seen on HD 92/56 asymp 400/800 Goal UF net 1.7L No complaints; no changes.   Dwana Melena, MD 12/14/2018, 3:16 PM  Subjective:     Objective Vitals:   12/13/18 1930 12/13/18 2005 12/13/18 2047 12/14/18 0351  BP: 130/75 (!) 144/77 132/74 102/61  Pulse: 80 78 85 69  Resp:  18 20 20   Temp:  98 F (36.7 C) 99.2 F (37.3 C) 98.5 F (36.9 C)  TempSrc:  Oral Oral Oral  SpO2: 100% 100% 96% 93%  Weight:  51.4 kg    Height:       Physical Exam General: slender woman breathing easily on room air Heart: RRR Lungs: dim BS few crackles at bases Abdomen: soft NT Extremities: no LE edema Dialysis Access: left AVF + bruit   Additional Objective Labs: Basic Metabolic Panel: Recent Labs   Lab 12/13/18 1642 12/13/18 2108 12/14/18 0532  NA 134* 138 135  K 5.6* 4.4 4.5  CL 91* 94* 93*  CO2 28 28 28   GLUCOSE 91 101* 105*  BUN 24* 7* 17  CREATININE 7.65* 3.43* 4.48*  CALCIUM 8.9 9.4 8.9  PHOS 5.6* 3.3  --    Liver Function Tests: Recent Labs  Lab 12/13/18 1141 12/13/18 1642 12/13/18 2108 12/14/18 0532  AST 29  --   --  23  ALT 13  --   --  10  ALKPHOS 65  --   --  59  BILITOT 0.6  --   --  1.2  PROT 7.0  --   --  7.0  ALBUMIN 3.5 3.1* 3.6 3.2*   No results for input(s): LIPASE, AMYLASE in the last 168 hours. CBC: Recent Labs  Lab 12/13/18 1642 12/13/18 2108 12/14/18 0532  WBC 7.7 7.8 6.6  NEUTROABS  --  5.5  --   HGB 9.4* 10.9* 10.0*  HCT 29.6* 34.1* 31.2*  MCV 94.3 95.0 94.8  PLT 159 202 183   Blood Culture    Component Value Date/Time   SDES BLOOD RIGHT HAND 11/19/2015 0937   SPECREQUEST  11/19/2015 0937    BOTTLES DRAWN AEROBIC AND ANAEROBIC 10CCS BLUE 5CCS RED  CULT NO GROWTH 5 DAYS 11/19/2015 0937   REPTSTATUS 11/24/2015 FINAL 11/19/2015 0937    Cardiac Enzymes: Recent Labs  Lab 12/13/18 1141  TROPONINI 0.03*   CBG: No results for input(s): GLUCAP in the last 168 hours. Iron Studies: No results for input(s): IRON, TIBC, TRANSFERRIN, FERRITIN in the last 72 hours. Lab Results  Component Value Date   INR 1.10 02/23/2017   INR 1.13 11/19/2015   INR 1.06 11/16/2015   Studies/Results: Dg Chest 2 View  Result Date: 12/13/2018 CLINICAL DATA:  Weakness. Shortness of breath.  Hemodialysis. EXAM: CHEST - 2 VIEW COMPARISON:  09/05/2018. FINDINGS: Cardiomegaly. Mild vascular congestion. Calcified tortuous aorta. No consolidation or frank edema. Slight worsening aeration from priors. IMPRESSION: Cardiomegaly with mild vascular congestion. Early edema is not excluded. Electronically Signed   By: Staci Righter M.D.   On: 12/13/2018 12:08   Medications: . sodium chloride    . sodium chloride     . allopurinol  150 mg Oral QHS  .  amLODipine  10 mg Oral Daily  . calcitRIOL  1.5 mcg Oral Q M,W,F-HD  . calcium carbonate  1 tablet Oral TID AC & HS  . Chlorhexidine Gluconate Cloth  6 each Topical Q0600  . cinacalcet  60 mg Oral Q M,W,F-HD  . doxazosin  8 mg Oral QHS  . ferric citrate  210 mg Oral TID WC  . heparin  3,000 Units Dialysis Once in dialysis  . heparin  5,000 Units Subcutaneous Q8H  . labetalol  300 mg Oral BID  . losartan  100 mg Oral QHS  . methocarbamol  500 mg Oral BID  . simvastatin  10 mg Oral QHS

## 2018-12-14 NOTE — Progress Notes (Signed)
Patient Discharge: Disposition: Patient discharged to home. Education: Reviewed medications, prescriptions, follow-=up appointments and discharge instructions. IV: Discontinued IV before discharge. Telemetry: Discontinued before discharge. Transportation: Patient escorted out of the unit in w/c. Belongings: Patient took all her belongings with her.

## 2018-12-14 NOTE — Discharge Summary (Signed)
Physician Discharge Summary  [name] NTI:[id number] DOB: 07-14-1951 DOA: 12/13/2018  PCP: [name], MD  Admit date: 12/13/2018 Discharge date: 12/14/2018  Admitted From: Home Disposition: Home  Recommendations for Outpatient Follow-up:  1. Follow up with PCP in 1 week 2. Please obtain BMP/CBC in one week 3. Blood pressure medications adjusted: discontinued amlodipine, decreased to losartan 50 mg daily 4. Please follow up on the following pending results: None  Home Health: None Equipment/Devices: None  Discharge Condition: Stable CODE STATUS: Full code Diet recommendation: Heart/renal patient   Brief/Interim Summary:  Admission HPI written by Caren Griffins, MD   Chief Complaint: Shortness of breath, fever and chills  HPI: [name] is a 68 y.o. female with medical history significant of end-stage renal disease on hemodialysis MWF, hypertension, hyperlipidemia, prior CVA, diastolic CHF who presents to the hospital with chief complaint of shortness of breath as well as fever chills.  This is been going on for about a week, also has some GI symptoms with nausea vomiting and diarrhea which have been worse today.  She is compliant with her dialysis, and was going to dialysis today however she was feeling so bad that came to the emergency room.  She denies any chest pain.  She denies any lightheadedness or dizziness, however reports ambulation issues due to balance following her stroke in 2017.  No new focal weakness or numbness.  She also reports extreme fatigue over the last week.  She does not recall in particular any sick contacts however thinks that may there may have been sick people during dialysis.  ED Course: In the emergency room her vital signs are stable, she is slightly tachypneic with respiratory rate of 25, normotensive, sats are in the upper 80s low 90s on room air and she was placed on 2 L nasal cannula. Blood work reveals a creatinine of  7.1, potassium of 5.6.  BNP is elevated at 3487.  Chest x-ray with cardiomegaly and evidence of pulmonary edema.  Nephrology was consulted, will dialyze patient this evening, but would like her admitted for overnight observation postdialysis   Hospital course:  Acute hypoxic respiratory failure in the setting of early pulmonary edema Nephrology consulted and patient underwent back-to-back HD. Some low blood pressure after HD and blood pressure medications adjusted.  URI/viral illness This is been going on for a week. Afebrile. Overall feels better. Chest x-ray without significant infiltrates.  Supportive treatment.  DuoNebs as needed.  End-stage renal disease on HD HD per nephrology  Hypertension Resume home medications  Hyperlipidemia Resume home medications  History of CVA Continue home Plavix  Acute on chronic diastolic CHF As mentioned above. Volume status managed by dialysis  Discharge Diagnoses:  Principal Problem:   Volume overload Active Problems:   ESRD (end stage renal disease) (HCC)   Anemia in CKD (chronic kidney disease)   Shortness of breath   HTN (hypertension)   Hyperlipidemia   CHF (congestive heart failure) (HCC)   H/O: CVA (cerebrovascular accident)   Hypoxemia    Discharge Instructions   Allergies as of 12/14/2018      Reactions   Sulfa Antibiotics Other (See Comments)   Both parents allergic-so will not take   Adhesive [tape] Itching      Medication List    STOP taking these medications   amLODipine 10 MG tablet Commonly known as:  NORVASC     TAKE these medications   acetaminophen 500 MG tablet Commonly known as:  TYLENOL Take  1 tablet (500 mg total) by mouth every 6 (six) hours as needed.   allopurinol 300 MG tablet Commonly known as:  ZYLOPRIM Take 150 mg by mouth at bedtime.   ALPRAZolam 0.25 MG tablet Commonly known as:  XANAX Take 0.25 mg by mouth daily as needed for anxiety.   calcium carbonate 500 MG chewable  tablet Commonly known as:  TUMS - dosed in mg elemental calcium Chew 1 tablet by mouth 4 (four) times daily.   clopidogrel 75 MG tablet Commonly known as:  PLAVIX Take 1 tablet (75 mg total) by mouth daily.   DIALYVITE PO Take 1 tablet by mouth daily.   DIALYVITE 800 0.8 MG Wafr Take 1 Wafer by mouth daily.   diclofenac sodium 1 % Gel Commonly known as:  VOLTAREN Apply 2 g topically 4 (four) times daily.   doxazosin 8 MG tablet Commonly known as:  CARDURA Take 1 tablet (8 mg total) by mouth at bedtime.   FOSRENOL 1000 MG chewable tablet Generic drug:  lanthanum Chew 1,000 mg by mouth 3 (three) times daily.   HYDROcodone-acetaminophen 5-325 MG tablet Commonly known as:  NORCO/VICODIN Take 1-2 tablets by mouth every 6 hours as needed for pain and/or cough.   labetalol 300 MG tablet Commonly known as:  NORMODYNE Take 300 mg by mouth 2 (two) times daily.   lactulose 10 GM/15ML solution Commonly known as:  CHRONULAC Take 20 g by mouth daily as needed for mild constipation.   losartan 50 MG tablet Commonly known as:  COZAAR Take 1 tablet (50 mg total) by mouth at bedtime. What changed:    medication strength  how much to take   meclizine 25 MG tablet Commonly known as:  ANTIVERT Take 1 tablet (25 mg total) by mouth 2 (two) times daily as needed for dizziness.   methocarbamol 500 MG tablet Commonly known as:  ROBAXIN Take 1 tablet (500 mg total) by mouth 2 (two) times daily.   ondansetron 8 MG tablet Commonly known as:  ZOFRAN Take 8 mg by mouth 2 (two) times daily as needed for nausea or vomiting.   SENSIPAR 30 MG tablet Generic drug:  cinacalcet Take 30 mg by mouth every Monday, Wednesday, and Friday with hemodialysis.   simvastatin 20 MG tablet Commonly known as:  ZOCOR Take 1 tablet (20 mg total) by mouth at bedtime. What changed:  how much to take   zolpidem 10 MG tablet Commonly known as:  AMBIEN Take 10 mg by mouth at bedtime as needed for  sleep.      Follow-up Information    [name], MD. Schedule an appointment as soon as possible for a visit in 1 week(s).   Specialty:  Internal Medicine Contact information: [address] [place] [address] [phone]          Allergies  Allergen Reactions  . Sulfa Antibiotics Other (See Comments)    Both parents allergic-so will not take  . Adhesive [Tape] Itching    Consultations:  Nephrology   Procedures/Studies: Dg Chest 2 View  Result Date: 12/13/2018 CLINICAL DATA:  Weakness. Shortness of breath.  Hemodialysis. EXAM: CHEST - 2 VIEW COMPARISON:  09/05/2018. FINDINGS: Cardiomegaly. Mild vascular congestion. Calcified tortuous aorta. No consolidation or frank edema. Slight worsening aeration from priors. IMPRESSION: Cardiomegaly with mild vascular congestion. Early edema is not excluded. Electronically Signed   By: Staci Righter M.D.   On: 12/13/2018 12:08      Subjective: Feels much better after one  session of HD.  Discharge Exam: Vitals:   12/14/18 1611 12/14/18 1705  BP: (!) 109/57 96/61  Pulse: 72 71  Resp: 16 18  Temp: 98 F (36.7 C) 97.6 F (36.4 C)  SpO2: 97% 97%   Vitals:   12/14/18 1530 12/14/18 1600 12/14/18 1611 12/14/18 1705  BP: 111/60 (!) 92/50 (!) 109/57 96/61  Pulse: 71 67 72 71  Resp:   16 18  Temp:   98 F (36.7 C) 97.6 F (36.4 C)  TempSrc:   Oral Oral  SpO2:   97% 97%  Weight:   52.6 kg   Height:        General: Pt is alert, awake, not in acute distress Cardiovascular: RRR, S1/S2 +, no rubs, no gallops Respiratory: CTA bilaterally, no wheezing, no rhonchi Abdominal: Soft, NT, ND, bowel sounds + Extremities: no edema, no cyanosis    The results of significant diagnostics from this hospitalization (including imaging, microbiology, ancillary and laboratory) are listed below for reference.     Microbiology: Recent Results (from the past 240 hour(s))  MRSA PCR Screening     Status: None    Collection Time: 12/13/18  9:01 PM  Result Value Ref Range Status   MRSA by PCR NEGATIVE NEGATIVE Final    Comment:        The GeneXpert MRSA Assay (FDA approved for NASAL specimens only), is one component of a comprehensive MRSA colonization surveillance program. It is not intended to diagnose MRSA infection nor to guide or monitor treatment for MRSA infections. Performed at [facility], [place] [address], [place],  [address]      Labs: BNP (last 3 results) Recent Labs    12/13/18 1141  BNP 5,462.7*   Basic Metabolic Panel: Recent Labs  Lab 12/13/18 1141 12/13/18 1642 12/13/18 2108 12/14/18 0532  NA 135 134* 138 135  K 5.6* 5.6* 4.4 4.5  CL 90* 91* 94* 93*  CO2 29 28 28 28   GLUCOSE 100* 91 101* 105*  BUN 25* 24* 7* 17  CREATININE 7.16* 7.65* 3.43* 4.48*  CALCIUM 9.3 8.9 9.4 8.9  PHOS  --  5.6* 3.3  --    Liver Function Tests: Recent Labs  Lab 12/13/18 1141 12/13/18 1642 12/13/18 2108 12/14/18 0532  AST 29  --   --  23  ALT 13  --   --  10  ALKPHOS 65  --   --  59  BILITOT 0.6  --   --  1.2  PROT 7.0  --   --  7.0  ALBUMIN 3.5 3.1* 3.6 3.2*   No results for input(s): LIPASE, AMYLASE in the last 168 hours. No results for input(s): AMMONIA in the last 168 hours. CBC: Recent Labs  Lab 12/13/18 1642 12/13/18 2108 12/14/18 0532  WBC 7.7 7.8 6.6  NEUTROABS  --  5.5  --   HGB 9.4* 10.9* 10.0*  HCT 29.6* 34.1* 31.2*  MCV 94.3 95.0 94.8  PLT 159 202 183   Cardiac Enzymes: Recent Labs  Lab 12/13/18 1141  TROPONINI 0.03*   BNP: Invalid input(s): POCBNP CBG: No results for input(s): GLUCAP in the last 168 hours. D-Dimer No results for input(s): DDIMER in the last 72 hours. Hgb A1c No results for input(s): HGBA1C in the last 72 hours. Lipid Profile No results for input(s): CHOL, HDL, LDLCALC, TRIG, CHOLHDL, LDLDIRECT in the last 72 hours. Thyroid function studies No results for input(s): TSH, T4TOTAL, T3FREE, THYROIDAB in the last  72 hours.  Invalid input(s): FREET3 Anemia work up No results for input(s): VITAMINB12, FOLATE, FERRITIN, TIBC, IRON, RETICCTPCT in the last 72 hours. Urinalysis    Component Value Date/Time   COLORURINE YELLOW 01/30/2011 1648   APPEARANCEUR CLOUDY (A) 01/30/2011 1648   LABSPEC 1.016 01/30/2011 1648   PHURINE 5.0 01/30/2011 1648   GLUCOSEU NEGATIVE 10/14/2009 0747   HGBUR SMALL (A) 01/30/2011 1648   BILIRUBINUR NEGATIVE 01/30/2011 1648   KETONESUR NEGATIVE 01/30/2011 1648   PROTEINUR 100 (A) 01/30/2011 1648   UROBILINOGEN 0.2 01/30/2011 1648   NITRITE NEGATIVE 01/30/2011 1648   LEUKOCYTESUR SMALL (A) 01/30/2011 1648   Sepsis Labs Invalid input(s): PROCALCITONIN,  WBC,  LACTICIDVEN Microbiology Recent Results (from the past 240 hour(s))  MRSA PCR Screening     Status: None   Collection Time: 12/13/18  9:01 PM  Result Value Ref Range Status   MRSA by PCR NEGATIVE NEGATIVE Final    Comment:        The GeneXpert MRSA Assay (FDA approved for NASAL specimens only), is one component of a comprehensive MRSA colonization surveillance program. It is not intended to diagnose MRSA infection nor to guide or monitor treatment for MRSA infections. Performed at [facility], [place] [address], [place], [place] [address]      SIGNED:   Cordelia Poche, MD [facility] 12/14/2018, 6:17 PM

## 2018-12-14 NOTE — Discharge Instructions (Signed)
[name],  You were here because of fluid overload which resolved with hemodialysis. Your blood pressure medication has been adjusted. Please stop your amlodipine. Your losartan has been cut in half. Please continue your labetalol but hold your medication for blood pressures with the systolic number (top) number less than 100 mmHg.

## 2018-12-16 DIAGNOSIS — N186 End stage renal disease: Secondary | ICD-10-CM | POA: Diagnosis not present

## 2018-12-16 DIAGNOSIS — D631 Anemia in chronic kidney disease: Secondary | ICD-10-CM | POA: Diagnosis not present

## 2018-12-16 DIAGNOSIS — N2581 Secondary hyperparathyroidism of renal origin: Secondary | ICD-10-CM | POA: Diagnosis not present

## 2018-12-16 LAB — HEPATITIS B SURFACE ANTIGEN: Hepatitis B Surface Ag: NEGATIVE

## 2018-12-19 DIAGNOSIS — D631 Anemia in chronic kidney disease: Secondary | ICD-10-CM | POA: Diagnosis not present

## 2018-12-19 DIAGNOSIS — N2581 Secondary hyperparathyroidism of renal origin: Secondary | ICD-10-CM | POA: Diagnosis not present

## 2018-12-19 DIAGNOSIS — N186 End stage renal disease: Secondary | ICD-10-CM | POA: Diagnosis not present

## 2018-12-20 IMAGING — CT CT HEAD W/O CM
3 of 4 series · 14 of 47 positions shown, 16 images · non-contrast
Comparison: May 12, 2017

CLINICAL DATA: Headache.  Dizziness.

EXAM:
CT HEAD WITHOUT CONTRAST
TECHNIQUE: Contiguous axial images were obtained from the base of the skull
through the vertex without intravenous contrast.

[Series 2: head 5.00 hr40 s3 ibhc · axial · 0.41mm/px · z∈[-550,-435]mm · 8 of 27 slices shown, 10 images]
[im 2/27  brain]
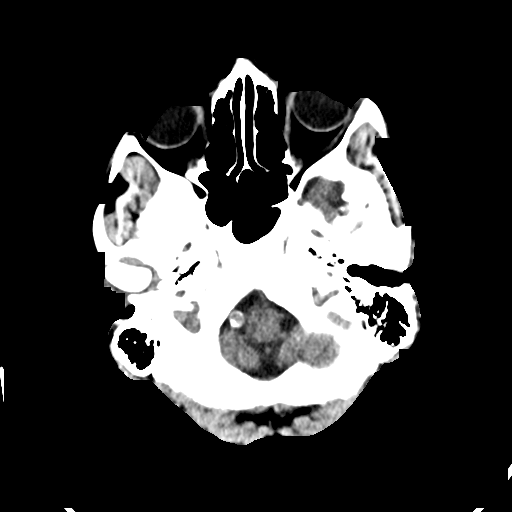
[im 2/27  bone]
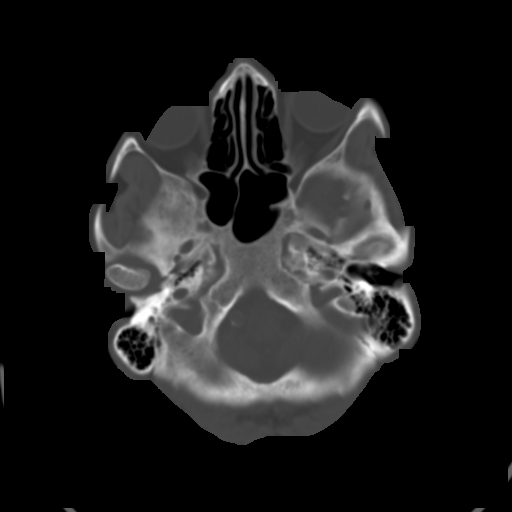
[im 6/27  brain]
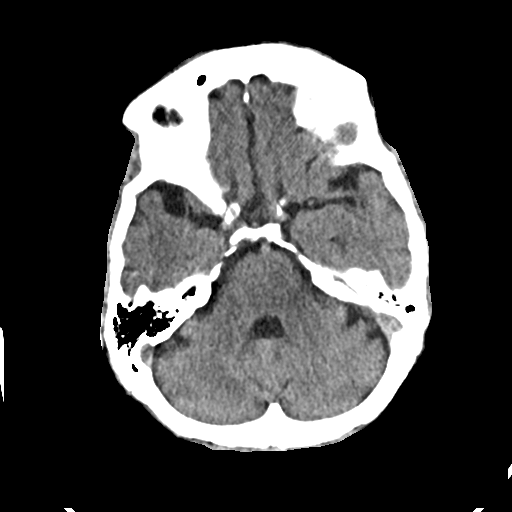
[im 10/27  brain]
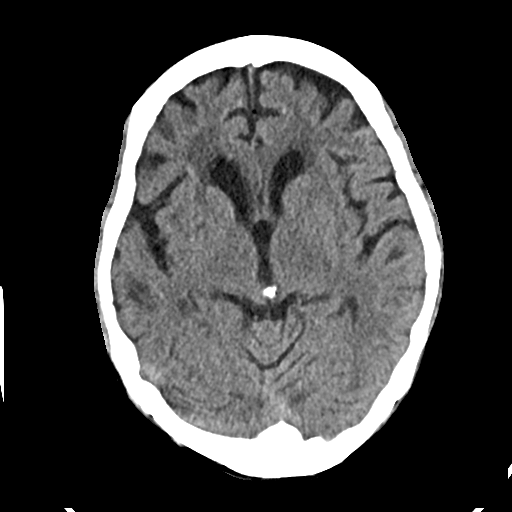
[im 12/27  brain]
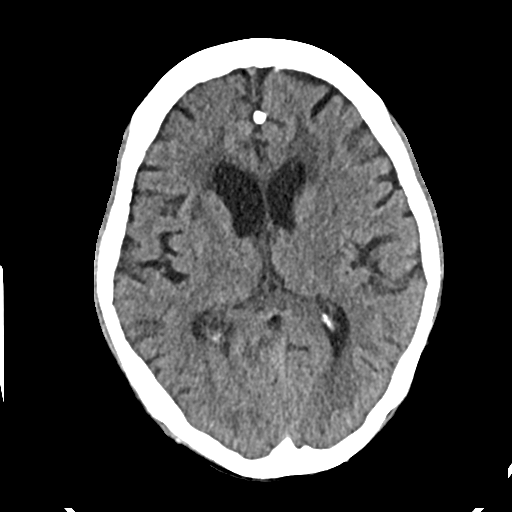
[im 15/27  brain]
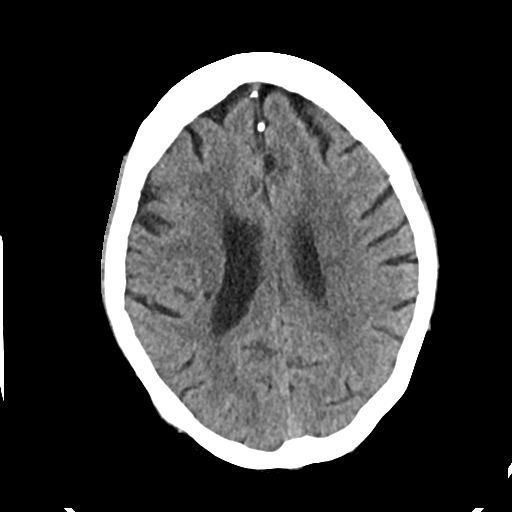
[im 15/27  bone]
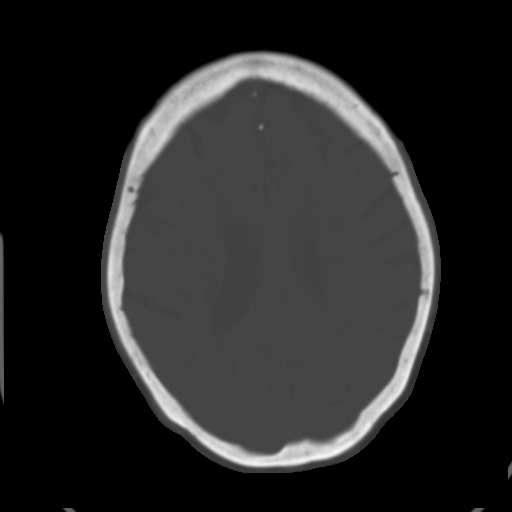
[im 17/27  brain]
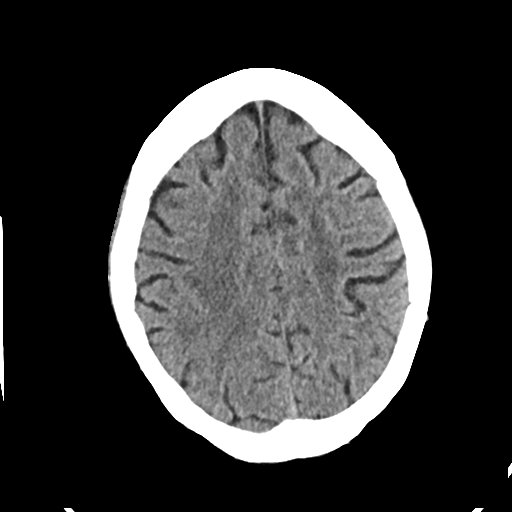
[im 21/27  brain]
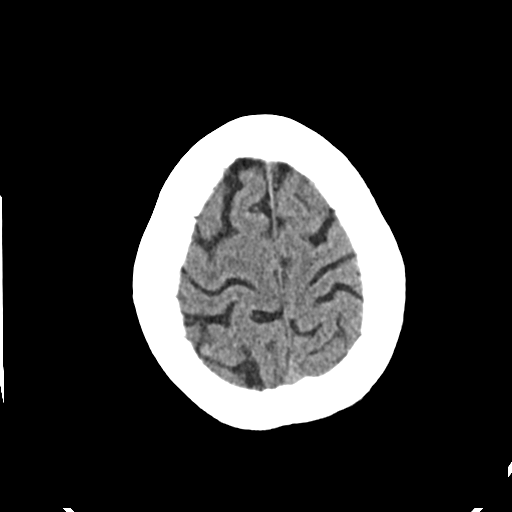
[im 25/27  brain]
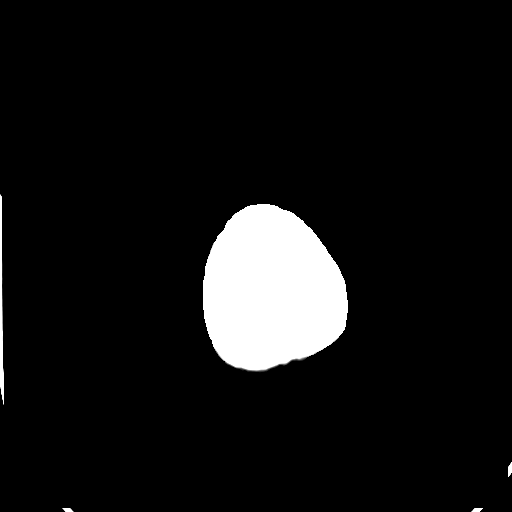

[Series 4: head 3.00 hr40 s3 cor · coronal · 0.28mm/px · 3 of 71 slices shown]
[im 24/71  brain]
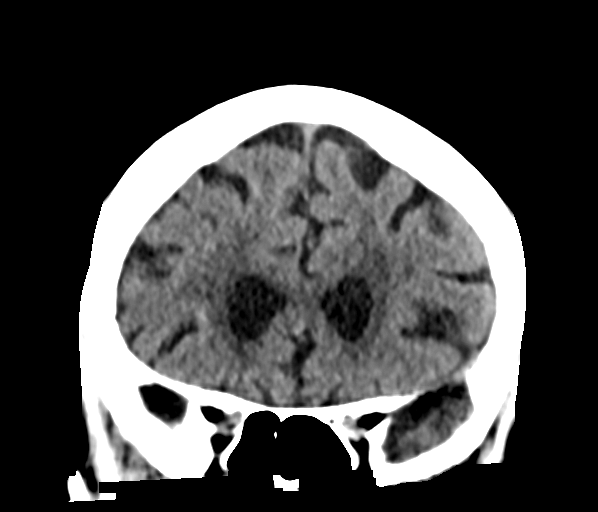
[im 32/71  brain]
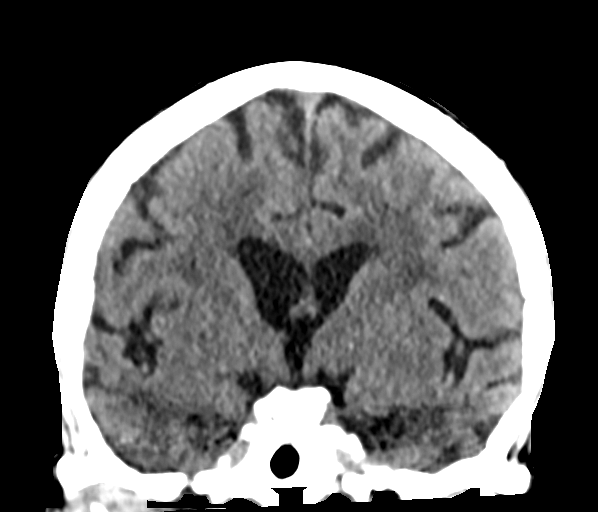
[im 39/71  brain]
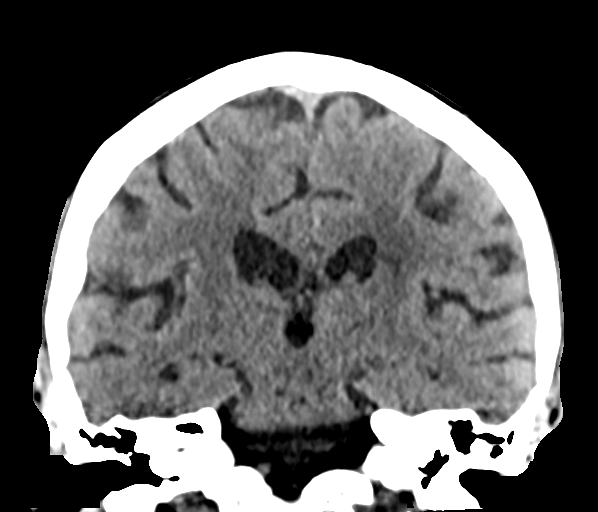

[Series 6: head 3.00 hr40 s3 sag · sagittal · 0.26mm/px · 3 of 55 slices shown]
[im 19/55  brain]
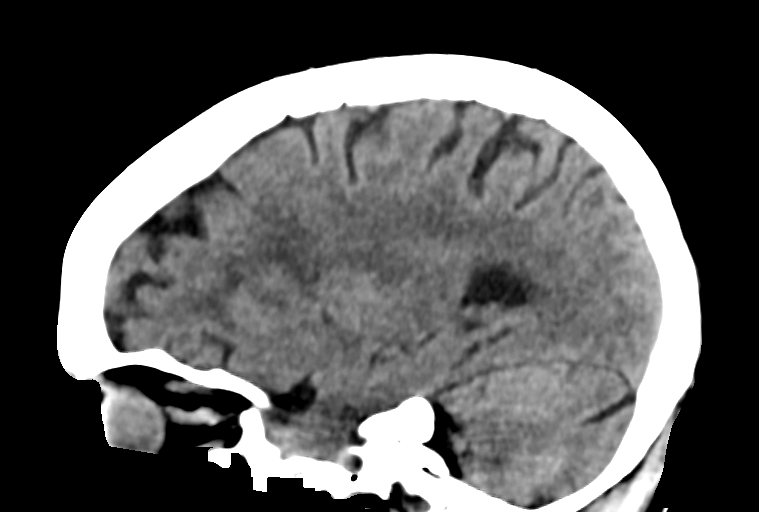
[im 28/55  brain]
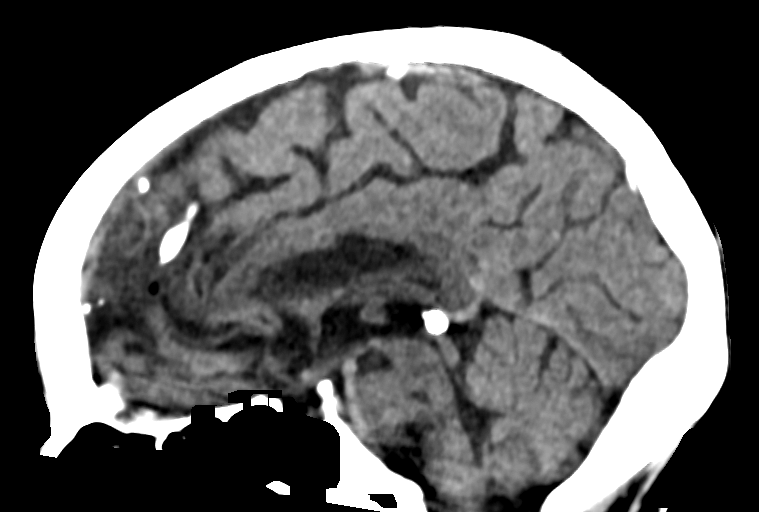
[im 37/55  brain]
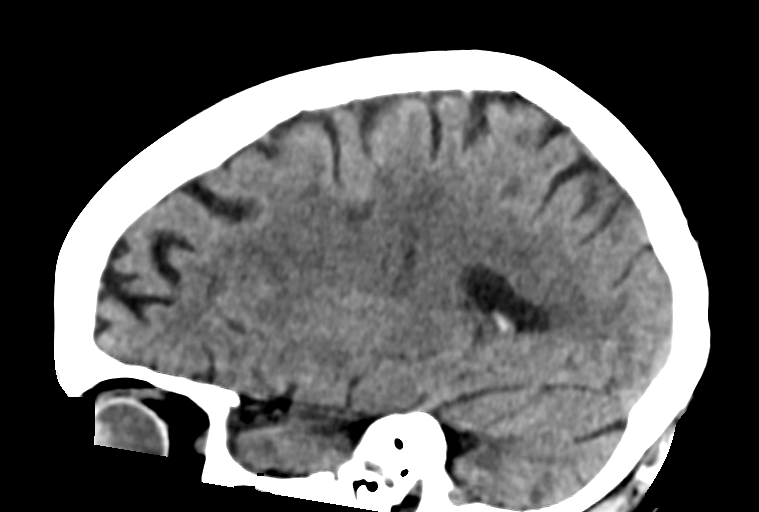

[14 of 47 positions shown; findings below may reference images not displayed]

FINDINGS: Brain: No subdural, epidural, or subarachnoid hemorrhage.
Cerebellum, brainstem, and basal cisterns are normal. Ventricles and
sulci are unchanged. Scattered white matter changes and small lacuna
are stable. No acute cortical ischemia or infarct.

Vascular: Calcified atherosclerosis in the intracranial carotids.

Skull: Normal. Negative for fracture or focal lesion.

Sinuses/Orbits: No acute finding.

Other: There is widening of the pre odontoid space on the lateral
view, unchanged since 0868 and nonacute. Spinal laminal line of C1
does not align with C2, also unchanged.
IMPRESSION: 1. Scattered white matter changes. No acute intracranial
abnormality.
2. Widening of the pre odontoid space and malalignment of the first
and second spinal laminal lines, unchanged since 0868. This is not
an acute finding and the patient reports no trauma. Recommend
clinical correlation. Dedicated CT imaging of the cervical spine
could of course better evaluate if clinically warranted.

## 2018-12-21 DIAGNOSIS — N186 End stage renal disease: Secondary | ICD-10-CM | POA: Diagnosis not present

## 2018-12-21 DIAGNOSIS — N2581 Secondary hyperparathyroidism of renal origin: Secondary | ICD-10-CM | POA: Diagnosis not present

## 2018-12-21 DIAGNOSIS — D631 Anemia in chronic kidney disease: Secondary | ICD-10-CM | POA: Diagnosis not present

## 2018-12-22 DIAGNOSIS — J069 Acute upper respiratory infection, unspecified: Secondary | ICD-10-CM | POA: Diagnosis not present

## 2018-12-22 DIAGNOSIS — J9621 Acute and chronic respiratory failure with hypoxia: Secondary | ICD-10-CM | POA: Diagnosis not present

## 2018-12-22 DIAGNOSIS — J810 Acute pulmonary edema: Secondary | ICD-10-CM | POA: Diagnosis not present

## 2018-12-22 DIAGNOSIS — N186 End stage renal disease: Secondary | ICD-10-CM | POA: Diagnosis not present

## 2018-12-22 DIAGNOSIS — I5033 Acute on chronic diastolic (congestive) heart failure: Secondary | ICD-10-CM | POA: Diagnosis not present

## 2018-12-23 DIAGNOSIS — N2581 Secondary hyperparathyroidism of renal origin: Secondary | ICD-10-CM | POA: Diagnosis not present

## 2018-12-23 DIAGNOSIS — N186 End stage renal disease: Secondary | ICD-10-CM | POA: Diagnosis not present

## 2018-12-23 DIAGNOSIS — D631 Anemia in chronic kidney disease: Secondary | ICD-10-CM | POA: Diagnosis not present

## 2018-12-26 DIAGNOSIS — N186 End stage renal disease: Secondary | ICD-10-CM | POA: Diagnosis not present

## 2018-12-26 DIAGNOSIS — D631 Anemia in chronic kidney disease: Secondary | ICD-10-CM | POA: Diagnosis not present

## 2018-12-26 DIAGNOSIS — N2581 Secondary hyperparathyroidism of renal origin: Secondary | ICD-10-CM | POA: Diagnosis not present

## 2018-12-28 DIAGNOSIS — N2581 Secondary hyperparathyroidism of renal origin: Secondary | ICD-10-CM | POA: Diagnosis not present

## 2018-12-28 DIAGNOSIS — D631 Anemia in chronic kidney disease: Secondary | ICD-10-CM | POA: Diagnosis not present

## 2018-12-28 DIAGNOSIS — N186 End stage renal disease: Secondary | ICD-10-CM | POA: Diagnosis not present

## 2018-12-30 DIAGNOSIS — N186 End stage renal disease: Secondary | ICD-10-CM | POA: Diagnosis not present

## 2018-12-30 DIAGNOSIS — N2581 Secondary hyperparathyroidism of renal origin: Secondary | ICD-10-CM | POA: Diagnosis not present

## 2018-12-30 DIAGNOSIS — D631 Anemia in chronic kidney disease: Secondary | ICD-10-CM | POA: Diagnosis not present

## 2018-12-31 DIAGNOSIS — E8770 Fluid overload, unspecified: Secondary | ICD-10-CM | POA: Diagnosis not present

## 2018-12-31 DIAGNOSIS — N186 End stage renal disease: Secondary | ICD-10-CM | POA: Diagnosis not present

## 2018-12-31 DIAGNOSIS — N2581 Secondary hyperparathyroidism of renal origin: Secondary | ICD-10-CM | POA: Diagnosis not present

## 2019-01-02 DIAGNOSIS — N186 End stage renal disease: Secondary | ICD-10-CM | POA: Diagnosis not present

## 2019-01-02 DIAGNOSIS — N2581 Secondary hyperparathyroidism of renal origin: Secondary | ICD-10-CM | POA: Diagnosis not present

## 2019-01-02 DIAGNOSIS — D631 Anemia in chronic kidney disease: Secondary | ICD-10-CM | POA: Diagnosis not present

## 2019-01-03 DIAGNOSIS — H60392 Other infective otitis externa, left ear: Secondary | ICD-10-CM | POA: Diagnosis not present

## 2019-01-04 DIAGNOSIS — D631 Anemia in chronic kidney disease: Secondary | ICD-10-CM | POA: Diagnosis not present

## 2019-01-04 DIAGNOSIS — N2581 Secondary hyperparathyroidism of renal origin: Secondary | ICD-10-CM | POA: Diagnosis not present

## 2019-01-04 DIAGNOSIS — N186 End stage renal disease: Secondary | ICD-10-CM | POA: Diagnosis not present

## 2019-01-06 DIAGNOSIS — N186 End stage renal disease: Secondary | ICD-10-CM | POA: Diagnosis not present

## 2019-01-06 DIAGNOSIS — N2581 Secondary hyperparathyroidism of renal origin: Secondary | ICD-10-CM | POA: Diagnosis not present

## 2019-01-06 DIAGNOSIS — D631 Anemia in chronic kidney disease: Secondary | ICD-10-CM | POA: Diagnosis not present

## 2019-01-09 DIAGNOSIS — N2581 Secondary hyperparathyroidism of renal origin: Secondary | ICD-10-CM | POA: Diagnosis not present

## 2019-01-09 DIAGNOSIS — N186 End stage renal disease: Secondary | ICD-10-CM | POA: Diagnosis not present

## 2019-01-09 DIAGNOSIS — D631 Anemia in chronic kidney disease: Secondary | ICD-10-CM | POA: Diagnosis not present

## 2019-01-11 DIAGNOSIS — N2581 Secondary hyperparathyroidism of renal origin: Secondary | ICD-10-CM | POA: Diagnosis not present

## 2019-01-11 DIAGNOSIS — D631 Anemia in chronic kidney disease: Secondary | ICD-10-CM | POA: Diagnosis not present

## 2019-01-11 DIAGNOSIS — N186 End stage renal disease: Secondary | ICD-10-CM | POA: Diagnosis not present

## 2019-01-12 ENCOUNTER — Encounter: Payer: Self-pay | Admitting: *Deleted

## 2019-01-12 ENCOUNTER — Institutional Professional Consult (permissible substitution): Payer: Self-pay | Admitting: Neurology

## 2019-01-12 ENCOUNTER — Telehealth: Payer: Self-pay | Admitting: *Deleted

## 2019-01-12 ENCOUNTER — Ambulatory Visit: Payer: Medicare Other | Admitting: Neurology

## 2019-01-12 NOTE — Telephone Encounter (Signed)
Patient no showed new consult appt today.

## 2019-01-13 ENCOUNTER — Ambulatory Visit: Payer: Medicare Other | Admitting: Nurse Practitioner

## 2019-01-13 DIAGNOSIS — N186 End stage renal disease: Secondary | ICD-10-CM | POA: Diagnosis not present

## 2019-01-13 DIAGNOSIS — D631 Anemia in chronic kidney disease: Secondary | ICD-10-CM | POA: Diagnosis not present

## 2019-01-13 DIAGNOSIS — N2581 Secondary hyperparathyroidism of renal origin: Secondary | ICD-10-CM | POA: Diagnosis not present

## 2019-01-14 DIAGNOSIS — Z992 Dependence on renal dialysis: Secondary | ICD-10-CM | POA: Diagnosis not present

## 2019-01-14 DIAGNOSIS — T8612 Kidney transplant failure: Secondary | ICD-10-CM | POA: Diagnosis not present

## 2019-01-14 DIAGNOSIS — N186 End stage renal disease: Secondary | ICD-10-CM | POA: Diagnosis not present

## 2019-01-16 DIAGNOSIS — D631 Anemia in chronic kidney disease: Secondary | ICD-10-CM | POA: Diagnosis not present

## 2019-01-16 DIAGNOSIS — N186 End stage renal disease: Secondary | ICD-10-CM | POA: Diagnosis not present

## 2019-01-16 DIAGNOSIS — N2581 Secondary hyperparathyroidism of renal origin: Secondary | ICD-10-CM | POA: Diagnosis not present

## 2019-01-17 ENCOUNTER — Encounter: Payer: Self-pay | Admitting: Neurology

## 2019-01-18 DIAGNOSIS — D631 Anemia in chronic kidney disease: Secondary | ICD-10-CM | POA: Diagnosis not present

## 2019-01-18 DIAGNOSIS — N186 End stage renal disease: Secondary | ICD-10-CM | POA: Diagnosis not present

## 2019-01-18 DIAGNOSIS — N2581 Secondary hyperparathyroidism of renal origin: Secondary | ICD-10-CM | POA: Diagnosis not present

## 2019-01-19 ENCOUNTER — Encounter: Payer: Self-pay | Admitting: Gastroenterology

## 2019-01-20 DIAGNOSIS — N186 End stage renal disease: Secondary | ICD-10-CM | POA: Diagnosis not present

## 2019-01-20 DIAGNOSIS — D631 Anemia in chronic kidney disease: Secondary | ICD-10-CM | POA: Diagnosis not present

## 2019-01-20 DIAGNOSIS — N2581 Secondary hyperparathyroidism of renal origin: Secondary | ICD-10-CM | POA: Diagnosis not present

## 2019-01-23 DIAGNOSIS — N2581 Secondary hyperparathyroidism of renal origin: Secondary | ICD-10-CM | POA: Diagnosis not present

## 2019-01-23 DIAGNOSIS — D631 Anemia in chronic kidney disease: Secondary | ICD-10-CM | POA: Diagnosis not present

## 2019-01-23 DIAGNOSIS — N186 End stage renal disease: Secondary | ICD-10-CM | POA: Diagnosis not present

## 2019-01-25 DIAGNOSIS — D631 Anemia in chronic kidney disease: Secondary | ICD-10-CM | POA: Diagnosis not present

## 2019-01-25 DIAGNOSIS — N2581 Secondary hyperparathyroidism of renal origin: Secondary | ICD-10-CM | POA: Diagnosis not present

## 2019-01-25 DIAGNOSIS — N186 End stage renal disease: Secondary | ICD-10-CM | POA: Diagnosis not present

## 2019-01-27 DIAGNOSIS — N2581 Secondary hyperparathyroidism of renal origin: Secondary | ICD-10-CM | POA: Diagnosis not present

## 2019-01-27 DIAGNOSIS — N186 End stage renal disease: Secondary | ICD-10-CM | POA: Diagnosis not present

## 2019-01-27 DIAGNOSIS — D631 Anemia in chronic kidney disease: Secondary | ICD-10-CM | POA: Diagnosis not present

## 2019-01-30 DIAGNOSIS — D631 Anemia in chronic kidney disease: Secondary | ICD-10-CM | POA: Diagnosis not present

## 2019-01-30 DIAGNOSIS — N2581 Secondary hyperparathyroidism of renal origin: Secondary | ICD-10-CM | POA: Diagnosis not present

## 2019-01-30 DIAGNOSIS — N186 End stage renal disease: Secondary | ICD-10-CM | POA: Diagnosis not present

## 2019-02-01 DIAGNOSIS — N2581 Secondary hyperparathyroidism of renal origin: Secondary | ICD-10-CM | POA: Diagnosis not present

## 2019-02-01 DIAGNOSIS — D631 Anemia in chronic kidney disease: Secondary | ICD-10-CM | POA: Diagnosis not present

## 2019-02-01 DIAGNOSIS — N186 End stage renal disease: Secondary | ICD-10-CM | POA: Diagnosis not present

## 2019-02-03 DIAGNOSIS — N186 End stage renal disease: Secondary | ICD-10-CM | POA: Diagnosis not present

## 2019-02-03 DIAGNOSIS — N2581 Secondary hyperparathyroidism of renal origin: Secondary | ICD-10-CM | POA: Diagnosis not present

## 2019-02-03 DIAGNOSIS — D631 Anemia in chronic kidney disease: Secondary | ICD-10-CM | POA: Diagnosis not present

## 2019-02-06 DIAGNOSIS — N186 End stage renal disease: Secondary | ICD-10-CM | POA: Diagnosis not present

## 2019-02-06 DIAGNOSIS — D631 Anemia in chronic kidney disease: Secondary | ICD-10-CM | POA: Diagnosis not present

## 2019-02-06 DIAGNOSIS — N2581 Secondary hyperparathyroidism of renal origin: Secondary | ICD-10-CM | POA: Diagnosis not present

## 2019-02-08 DIAGNOSIS — N186 End stage renal disease: Secondary | ICD-10-CM | POA: Diagnosis not present

## 2019-02-08 DIAGNOSIS — N2581 Secondary hyperparathyroidism of renal origin: Secondary | ICD-10-CM | POA: Diagnosis not present

## 2019-02-08 DIAGNOSIS — D631 Anemia in chronic kidney disease: Secondary | ICD-10-CM | POA: Diagnosis not present

## 2019-02-10 DIAGNOSIS — D631 Anemia in chronic kidney disease: Secondary | ICD-10-CM | POA: Diagnosis not present

## 2019-02-10 DIAGNOSIS — N2581 Secondary hyperparathyroidism of renal origin: Secondary | ICD-10-CM | POA: Diagnosis not present

## 2019-02-10 DIAGNOSIS — N186 End stage renal disease: Secondary | ICD-10-CM | POA: Diagnosis not present

## 2019-02-12 DIAGNOSIS — N186 End stage renal disease: Secondary | ICD-10-CM | POA: Diagnosis not present

## 2019-02-12 DIAGNOSIS — T8612 Kidney transplant failure: Secondary | ICD-10-CM | POA: Diagnosis not present

## 2019-02-12 DIAGNOSIS — Z992 Dependence on renal dialysis: Secondary | ICD-10-CM | POA: Diagnosis not present

## 2019-02-13 DIAGNOSIS — D631 Anemia in chronic kidney disease: Secondary | ICD-10-CM | POA: Diagnosis not present

## 2019-02-13 DIAGNOSIS — N2581 Secondary hyperparathyroidism of renal origin: Secondary | ICD-10-CM | POA: Diagnosis not present

## 2019-02-13 DIAGNOSIS — N186 End stage renal disease: Secondary | ICD-10-CM | POA: Diagnosis not present

## 2019-02-15 DIAGNOSIS — D631 Anemia in chronic kidney disease: Secondary | ICD-10-CM | POA: Diagnosis not present

## 2019-02-15 DIAGNOSIS — N186 End stage renal disease: Secondary | ICD-10-CM | POA: Diagnosis not present

## 2019-02-15 DIAGNOSIS — N2581 Secondary hyperparathyroidism of renal origin: Secondary | ICD-10-CM | POA: Diagnosis not present

## 2019-02-17 DIAGNOSIS — N2581 Secondary hyperparathyroidism of renal origin: Secondary | ICD-10-CM | POA: Diagnosis not present

## 2019-02-17 DIAGNOSIS — D631 Anemia in chronic kidney disease: Secondary | ICD-10-CM | POA: Diagnosis not present

## 2019-02-17 DIAGNOSIS — N186 End stage renal disease: Secondary | ICD-10-CM | POA: Diagnosis not present

## 2019-02-20 DIAGNOSIS — D631 Anemia in chronic kidney disease: Secondary | ICD-10-CM | POA: Diagnosis not present

## 2019-02-20 DIAGNOSIS — N2581 Secondary hyperparathyroidism of renal origin: Secondary | ICD-10-CM | POA: Diagnosis not present

## 2019-02-20 DIAGNOSIS — N186 End stage renal disease: Secondary | ICD-10-CM | POA: Diagnosis not present

## 2019-02-22 DIAGNOSIS — N186 End stage renal disease: Secondary | ICD-10-CM | POA: Diagnosis not present

## 2019-02-22 DIAGNOSIS — D631 Anemia in chronic kidney disease: Secondary | ICD-10-CM | POA: Diagnosis not present

## 2019-02-22 DIAGNOSIS — N2581 Secondary hyperparathyroidism of renal origin: Secondary | ICD-10-CM | POA: Diagnosis not present

## 2019-02-23 DIAGNOSIS — H353131 Nonexudative age-related macular degeneration, bilateral, early dry stage: Secondary | ICD-10-CM | POA: Diagnosis not present

## 2019-02-23 DIAGNOSIS — Z961 Presence of intraocular lens: Secondary | ICD-10-CM | POA: Diagnosis not present

## 2019-02-24 DIAGNOSIS — N186 End stage renal disease: Secondary | ICD-10-CM | POA: Diagnosis not present

## 2019-02-24 DIAGNOSIS — D631 Anemia in chronic kidney disease: Secondary | ICD-10-CM | POA: Diagnosis not present

## 2019-02-24 DIAGNOSIS — N2581 Secondary hyperparathyroidism of renal origin: Secondary | ICD-10-CM | POA: Diagnosis not present

## 2019-02-27 DIAGNOSIS — N2581 Secondary hyperparathyroidism of renal origin: Secondary | ICD-10-CM | POA: Diagnosis not present

## 2019-02-27 DIAGNOSIS — D631 Anemia in chronic kidney disease: Secondary | ICD-10-CM | POA: Diagnosis not present

## 2019-02-27 DIAGNOSIS — N186 End stage renal disease: Secondary | ICD-10-CM | POA: Diagnosis not present

## 2019-03-01 DIAGNOSIS — D631 Anemia in chronic kidney disease: Secondary | ICD-10-CM | POA: Diagnosis not present

## 2019-03-01 DIAGNOSIS — N2581 Secondary hyperparathyroidism of renal origin: Secondary | ICD-10-CM | POA: Diagnosis not present

## 2019-03-01 DIAGNOSIS — N186 End stage renal disease: Secondary | ICD-10-CM | POA: Diagnosis not present

## 2019-03-03 DIAGNOSIS — D631 Anemia in chronic kidney disease: Secondary | ICD-10-CM | POA: Diagnosis not present

## 2019-03-03 DIAGNOSIS — N2581 Secondary hyperparathyroidism of renal origin: Secondary | ICD-10-CM | POA: Diagnosis not present

## 2019-03-03 DIAGNOSIS — N186 End stage renal disease: Secondary | ICD-10-CM | POA: Diagnosis not present

## 2019-03-06 DIAGNOSIS — N2581 Secondary hyperparathyroidism of renal origin: Secondary | ICD-10-CM | POA: Diagnosis not present

## 2019-03-06 DIAGNOSIS — D631 Anemia in chronic kidney disease: Secondary | ICD-10-CM | POA: Diagnosis not present

## 2019-03-06 DIAGNOSIS — N186 End stage renal disease: Secondary | ICD-10-CM | POA: Diagnosis not present

## 2019-03-08 DIAGNOSIS — N186 End stage renal disease: Secondary | ICD-10-CM | POA: Diagnosis not present

## 2019-03-08 DIAGNOSIS — N2581 Secondary hyperparathyroidism of renal origin: Secondary | ICD-10-CM | POA: Diagnosis not present

## 2019-03-08 DIAGNOSIS — D631 Anemia in chronic kidney disease: Secondary | ICD-10-CM | POA: Diagnosis not present

## 2019-03-10 DIAGNOSIS — D631 Anemia in chronic kidney disease: Secondary | ICD-10-CM | POA: Diagnosis not present

## 2019-03-10 DIAGNOSIS — N186 End stage renal disease: Secondary | ICD-10-CM | POA: Diagnosis not present

## 2019-03-10 DIAGNOSIS — N2581 Secondary hyperparathyroidism of renal origin: Secondary | ICD-10-CM | POA: Diagnosis not present

## 2019-03-13 DIAGNOSIS — D631 Anemia in chronic kidney disease: Secondary | ICD-10-CM | POA: Diagnosis not present

## 2019-03-13 DIAGNOSIS — N2581 Secondary hyperparathyroidism of renal origin: Secondary | ICD-10-CM | POA: Diagnosis not present

## 2019-03-13 DIAGNOSIS — N186 End stage renal disease: Secondary | ICD-10-CM | POA: Diagnosis not present

## 2019-03-15 DIAGNOSIS — T8612 Kidney transplant failure: Secondary | ICD-10-CM | POA: Diagnosis not present

## 2019-03-15 DIAGNOSIS — Z992 Dependence on renal dialysis: Secondary | ICD-10-CM | POA: Diagnosis not present

## 2019-03-15 DIAGNOSIS — N2581 Secondary hyperparathyroidism of renal origin: Secondary | ICD-10-CM | POA: Diagnosis not present

## 2019-03-15 DIAGNOSIS — N186 End stage renal disease: Secondary | ICD-10-CM | POA: Diagnosis not present

## 2019-03-15 DIAGNOSIS — D509 Iron deficiency anemia, unspecified: Secondary | ICD-10-CM | POA: Diagnosis not present

## 2019-03-15 DIAGNOSIS — D631 Anemia in chronic kidney disease: Secondary | ICD-10-CM | POA: Diagnosis not present

## 2019-03-17 DIAGNOSIS — N186 End stage renal disease: Secondary | ICD-10-CM | POA: Diagnosis not present

## 2019-03-17 DIAGNOSIS — D509 Iron deficiency anemia, unspecified: Secondary | ICD-10-CM | POA: Diagnosis not present

## 2019-03-17 DIAGNOSIS — D631 Anemia in chronic kidney disease: Secondary | ICD-10-CM | POA: Diagnosis not present

## 2019-03-17 DIAGNOSIS — N2581 Secondary hyperparathyroidism of renal origin: Secondary | ICD-10-CM | POA: Diagnosis not present

## 2019-03-20 DIAGNOSIS — D509 Iron deficiency anemia, unspecified: Secondary | ICD-10-CM | POA: Diagnosis not present

## 2019-03-20 DIAGNOSIS — N186 End stage renal disease: Secondary | ICD-10-CM | POA: Diagnosis not present

## 2019-03-20 DIAGNOSIS — N2581 Secondary hyperparathyroidism of renal origin: Secondary | ICD-10-CM | POA: Diagnosis not present

## 2019-03-20 DIAGNOSIS — D631 Anemia in chronic kidney disease: Secondary | ICD-10-CM | POA: Diagnosis not present

## 2019-03-22 DIAGNOSIS — N2581 Secondary hyperparathyroidism of renal origin: Secondary | ICD-10-CM | POA: Diagnosis not present

## 2019-03-22 DIAGNOSIS — D631 Anemia in chronic kidney disease: Secondary | ICD-10-CM | POA: Diagnosis not present

## 2019-03-22 DIAGNOSIS — D509 Iron deficiency anemia, unspecified: Secondary | ICD-10-CM | POA: Diagnosis not present

## 2019-03-22 DIAGNOSIS — N186 End stage renal disease: Secondary | ICD-10-CM | POA: Diagnosis not present

## 2019-03-24 DIAGNOSIS — D509 Iron deficiency anemia, unspecified: Secondary | ICD-10-CM | POA: Diagnosis not present

## 2019-03-24 DIAGNOSIS — D631 Anemia in chronic kidney disease: Secondary | ICD-10-CM | POA: Diagnosis not present

## 2019-03-24 DIAGNOSIS — N186 End stage renal disease: Secondary | ICD-10-CM | POA: Diagnosis not present

## 2019-03-24 DIAGNOSIS — N2581 Secondary hyperparathyroidism of renal origin: Secondary | ICD-10-CM | POA: Diagnosis not present

## 2019-03-27 DIAGNOSIS — N2581 Secondary hyperparathyroidism of renal origin: Secondary | ICD-10-CM | POA: Diagnosis not present

## 2019-03-27 DIAGNOSIS — N186 End stage renal disease: Secondary | ICD-10-CM | POA: Diagnosis not present

## 2019-03-27 DIAGNOSIS — D509 Iron deficiency anemia, unspecified: Secondary | ICD-10-CM | POA: Diagnosis not present

## 2019-03-27 DIAGNOSIS — D631 Anemia in chronic kidney disease: Secondary | ICD-10-CM | POA: Diagnosis not present

## 2019-03-29 DIAGNOSIS — D509 Iron deficiency anemia, unspecified: Secondary | ICD-10-CM | POA: Diagnosis not present

## 2019-03-29 DIAGNOSIS — N2581 Secondary hyperparathyroidism of renal origin: Secondary | ICD-10-CM | POA: Diagnosis not present

## 2019-03-29 DIAGNOSIS — D631 Anemia in chronic kidney disease: Secondary | ICD-10-CM | POA: Diagnosis not present

## 2019-03-29 DIAGNOSIS — N186 End stage renal disease: Secondary | ICD-10-CM | POA: Diagnosis not present

## 2019-03-30 ENCOUNTER — Telehealth: Payer: Self-pay | Admitting: Neurology

## 2019-03-30 NOTE — Telephone Encounter (Signed)
Reschedule in the office with Amy in June or July thanks

## 2019-03-30 NOTE — Telephone Encounter (Signed)
Oh then reschedule in the office if she can't do a web ex.

## 2019-03-30 NOTE — Telephone Encounter (Signed)
I called and spoke with the nurse regarding changing her visit to a VV. She stated that she is not very good with her phone and does not feel she can do a VV. She wanted to know if we would be able to just call her. I told her I would change to a telephone visit and confirm this was ok with the nurse. She gave consent to file with insurance.

## 2019-03-31 DIAGNOSIS — N186 End stage renal disease: Secondary | ICD-10-CM | POA: Diagnosis not present

## 2019-03-31 DIAGNOSIS — D631 Anemia in chronic kidney disease: Secondary | ICD-10-CM | POA: Diagnosis not present

## 2019-03-31 DIAGNOSIS — D509 Iron deficiency anemia, unspecified: Secondary | ICD-10-CM | POA: Diagnosis not present

## 2019-03-31 DIAGNOSIS — N2581 Secondary hyperparathyroidism of renal origin: Secondary | ICD-10-CM | POA: Diagnosis not present

## 2019-04-03 DIAGNOSIS — D631 Anemia in chronic kidney disease: Secondary | ICD-10-CM | POA: Diagnosis not present

## 2019-04-03 DIAGNOSIS — N2581 Secondary hyperparathyroidism of renal origin: Secondary | ICD-10-CM | POA: Diagnosis not present

## 2019-04-03 DIAGNOSIS — D509 Iron deficiency anemia, unspecified: Secondary | ICD-10-CM | POA: Diagnosis not present

## 2019-04-03 DIAGNOSIS — N186 End stage renal disease: Secondary | ICD-10-CM | POA: Diagnosis not present

## 2019-04-04 ENCOUNTER — Ambulatory Visit: Payer: Medicare Other | Admitting: Neurology

## 2019-04-05 DIAGNOSIS — N2581 Secondary hyperparathyroidism of renal origin: Secondary | ICD-10-CM | POA: Diagnosis not present

## 2019-04-05 DIAGNOSIS — D631 Anemia in chronic kidney disease: Secondary | ICD-10-CM | POA: Diagnosis not present

## 2019-04-05 DIAGNOSIS — N186 End stage renal disease: Secondary | ICD-10-CM | POA: Diagnosis not present

## 2019-04-05 DIAGNOSIS — D509 Iron deficiency anemia, unspecified: Secondary | ICD-10-CM | POA: Diagnosis not present

## 2019-04-07 DIAGNOSIS — D509 Iron deficiency anemia, unspecified: Secondary | ICD-10-CM | POA: Diagnosis not present

## 2019-04-07 DIAGNOSIS — D631 Anemia in chronic kidney disease: Secondary | ICD-10-CM | POA: Diagnosis not present

## 2019-04-07 DIAGNOSIS — N2581 Secondary hyperparathyroidism of renal origin: Secondary | ICD-10-CM | POA: Diagnosis not present

## 2019-04-07 DIAGNOSIS — N186 End stage renal disease: Secondary | ICD-10-CM | POA: Diagnosis not present

## 2019-04-10 DIAGNOSIS — D631 Anemia in chronic kidney disease: Secondary | ICD-10-CM | POA: Diagnosis not present

## 2019-04-10 DIAGNOSIS — N186 End stage renal disease: Secondary | ICD-10-CM | POA: Diagnosis not present

## 2019-04-10 DIAGNOSIS — N2581 Secondary hyperparathyroidism of renal origin: Secondary | ICD-10-CM | POA: Diagnosis not present

## 2019-04-10 DIAGNOSIS — D509 Iron deficiency anemia, unspecified: Secondary | ICD-10-CM | POA: Diagnosis not present

## 2019-04-12 DIAGNOSIS — D509 Iron deficiency anemia, unspecified: Secondary | ICD-10-CM | POA: Diagnosis not present

## 2019-04-12 DIAGNOSIS — D631 Anemia in chronic kidney disease: Secondary | ICD-10-CM | POA: Diagnosis not present

## 2019-04-12 DIAGNOSIS — N2581 Secondary hyperparathyroidism of renal origin: Secondary | ICD-10-CM | POA: Diagnosis not present

## 2019-04-12 DIAGNOSIS — N186 End stage renal disease: Secondary | ICD-10-CM | POA: Diagnosis not present

## 2019-04-14 DIAGNOSIS — D631 Anemia in chronic kidney disease: Secondary | ICD-10-CM | POA: Diagnosis not present

## 2019-04-14 DIAGNOSIS — T8612 Kidney transplant failure: Secondary | ICD-10-CM | POA: Diagnosis not present

## 2019-04-14 DIAGNOSIS — N2581 Secondary hyperparathyroidism of renal origin: Secondary | ICD-10-CM | POA: Diagnosis not present

## 2019-04-14 DIAGNOSIS — N186 End stage renal disease: Secondary | ICD-10-CM | POA: Diagnosis not present

## 2019-04-14 DIAGNOSIS — Z992 Dependence on renal dialysis: Secondary | ICD-10-CM | POA: Diagnosis not present
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
## 2019-04-17 DIAGNOSIS — T8612 Kidney transplant failure: Secondary | ICD-10-CM | POA: Insufficient documentation

## 2019-04-17 DIAGNOSIS — E7800 Pure hypercholesterolemia, unspecified: Secondary | ICD-10-CM | POA: Insufficient documentation

## 2019-04-17 DIAGNOSIS — N2581 Secondary hyperparathyroidism of renal origin: Secondary | ICD-10-CM | POA: Diagnosis not present

## 2019-04-17 DIAGNOSIS — R52 Pain, unspecified: Secondary | ICD-10-CM | POA: Insufficient documentation

## 2019-04-17 DIAGNOSIS — L299 Pruritus, unspecified: Secondary | ICD-10-CM | POA: Insufficient documentation

## 2019-04-17 DIAGNOSIS — R519 Headache, unspecified: Secondary | ICD-10-CM | POA: Insufficient documentation

## 2019-04-17 DIAGNOSIS — Z1331 Encounter for screening for depression: Secondary | ICD-10-CM | POA: Insufficient documentation

## 2019-04-17 DIAGNOSIS — M150 Primary generalized (osteo)arthritis: Secondary | ICD-10-CM | POA: Insufficient documentation

## 2019-04-17 DIAGNOSIS — M810 Age-related osteoporosis without current pathological fracture: Secondary | ICD-10-CM | POA: Insufficient documentation

## 2019-04-17 DIAGNOSIS — K635 Polyp of colon: Secondary | ICD-10-CM | POA: Insufficient documentation

## 2019-04-17 DIAGNOSIS — I10 Essential (primary) hypertension: Secondary | ICD-10-CM | POA: Insufficient documentation

## 2019-04-17 DIAGNOSIS — R197 Diarrhea, unspecified: Secondary | ICD-10-CM | POA: Insufficient documentation

## 2019-04-17 DIAGNOSIS — G459 Transient cerebral ischemic attack, unspecified: Secondary | ICD-10-CM | POA: Insufficient documentation

## 2019-04-17 DIAGNOSIS — D631 Anemia in chronic kidney disease: Secondary | ICD-10-CM | POA: Diagnosis not present

## 2019-04-17 DIAGNOSIS — N186 End stage renal disease: Secondary | ICD-10-CM | POA: Diagnosis not present

## 2019-04-17 DIAGNOSIS — I5023 Acute on chronic systolic (congestive) heart failure: Secondary | ICD-10-CM | POA: Insufficient documentation

## 2019-04-17 DIAGNOSIS — D689 Coagulation defect, unspecified: Secondary | ICD-10-CM | POA: Insufficient documentation

## 2019-04-17 DIAGNOSIS — A0472 Enterocolitis due to Clostridium difficile, not specified as recurrent: Secondary | ICD-10-CM | POA: Insufficient documentation

## 2019-04-17 DIAGNOSIS — F064 Anxiety disorder due to known physiological condition: Secondary | ICD-10-CM | POA: Insufficient documentation

## 2019-04-17 DIAGNOSIS — E785 Hyperlipidemia, unspecified: Secondary | ICD-10-CM | POA: Insufficient documentation

## 2019-04-17 DIAGNOSIS — K219 Gastro-esophageal reflux disease without esophagitis: Secondary | ICD-10-CM | POA: Insufficient documentation

## 2019-04-19 DIAGNOSIS — N186 End stage renal disease: Secondary | ICD-10-CM | POA: Diagnosis not present

## 2019-04-19 DIAGNOSIS — D631 Anemia in chronic kidney disease: Secondary | ICD-10-CM | POA: Diagnosis not present

## 2019-04-19 DIAGNOSIS — N2581 Secondary hyperparathyroidism of renal origin: Secondary | ICD-10-CM | POA: Diagnosis not present

## 2019-04-21 DIAGNOSIS — D631 Anemia in chronic kidney disease: Secondary | ICD-10-CM | POA: Diagnosis not present

## 2019-04-21 DIAGNOSIS — N186 End stage renal disease: Secondary | ICD-10-CM | POA: Diagnosis not present

## 2019-04-21 DIAGNOSIS — N2581 Secondary hyperparathyroidism of renal origin: Secondary | ICD-10-CM | POA: Diagnosis not present

## 2019-04-24 DIAGNOSIS — D631 Anemia in chronic kidney disease: Secondary | ICD-10-CM | POA: Diagnosis not present

## 2019-04-24 DIAGNOSIS — N186 End stage renal disease: Secondary | ICD-10-CM | POA: Diagnosis not present

## 2019-04-24 DIAGNOSIS — N2581 Secondary hyperparathyroidism of renal origin: Secondary | ICD-10-CM | POA: Diagnosis not present

## 2019-04-25 IMAGING — MR MR HEAD W/O CM
10 of 15 series · 33 of 48 positions shown · non-contrast
Comparison: MRI head August 07, 2018 and CT HEAD May 13, 2018

CLINICAL DATA: Dizziness, RIGHT-sided headache and ataxia. Assess
for stroke. History of end-stage renal disease on dialysis,
hypertension, hyperlipidemia and stroke.

EXAM:
MRI HEAD WITHOUT CONTRAST
TECHNIQUE: Multiplanar, multiecho pulse sequences of the brain and surrounding
structures were obtained without intravenous contrast.

[Series 5: ax dwi_tracew · axial · 3.0mm · 1.50mm/px · z∈[-51,+66]mm · 6 of 92 slices shown]
[im 1/92]
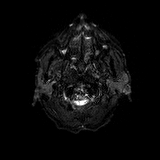
[im 19/92]
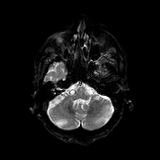
[im 37/92]
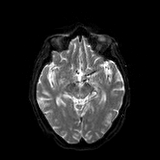
[im 55/92]
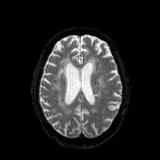
[im 73/92]
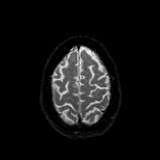
[im 92/92]
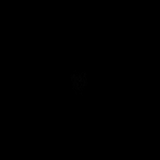

[Series 6: ax dwi_adc · axial · 3.0mm · 1.50mm/px · z∈[-51,+66]mm · 3 of 46 slices shown]
[im 1/46]
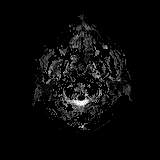
[im 23/46]
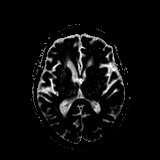
[im 46/46]
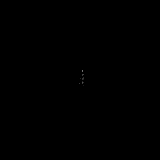

[Series 7: cor dwi_tracew · coronal · 5.0mm · 1.44mm/px · 5 of 72 slices shown]
[im 1/72]
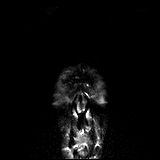
[im 18/72]
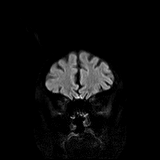
[im 36/72]
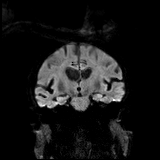
[im 54/72]
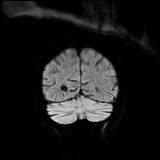
[im 72/72]
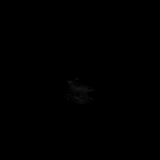

[Series 8: cor dwi_adc · coronal · 5.0mm · 1.44mm/px · 3 of 36 slices shown]
[im 1/36]
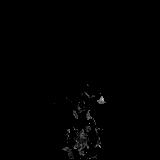
[im 18/36]
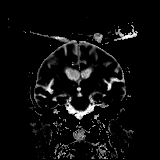
[im 36/36]
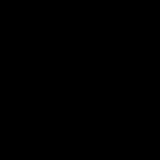

[Series 9: T1 · sagittal · 5.0mm · 0.75mm/px · 2 of 24 slices shown]
[im 1/24]
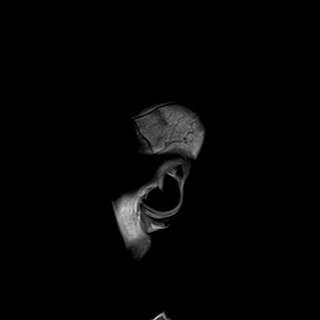
[im 24/24]
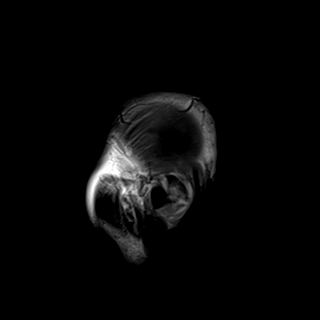

[Series 10: T2 · axial · 5.0mm · 0.72mm/px · z∈[-56,+69]mm · 2 of 25 slices shown (1 of 2)]
[im 1/25]
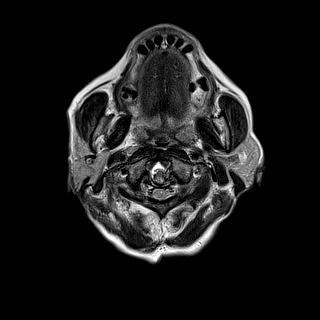
[im 25/25]
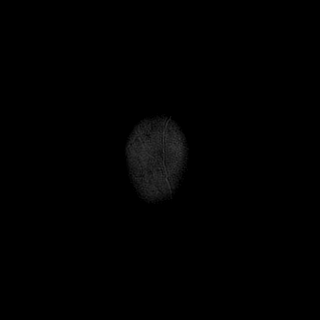

[Series 11: FLAIR · axial · 5.0mm · 0.45mm/px · z∈[-57,+68]mm · 2 of 25 slices shown]
[im 1/25]
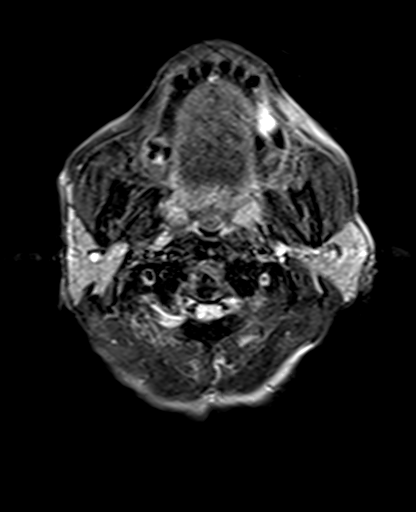
[im 25/25]
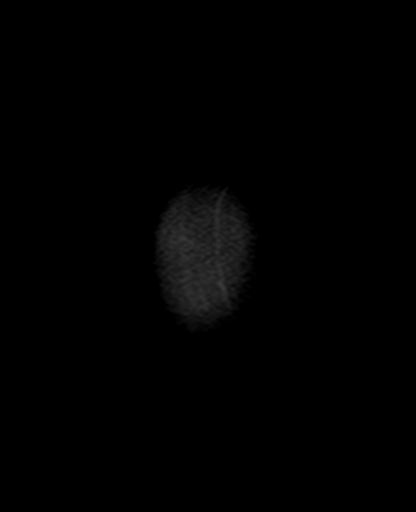

[Series 12: swi_images · axial · 3.0mm · 0.90mm/px · z∈[-71,+83]mm · 4 of 60 slices shown]
[im 1/60]
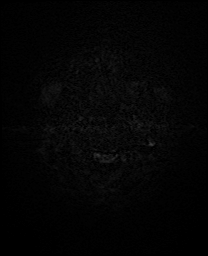
[im 20/60]
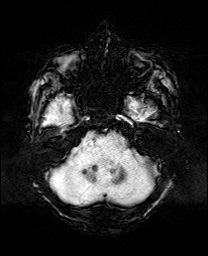
[im 40/60]
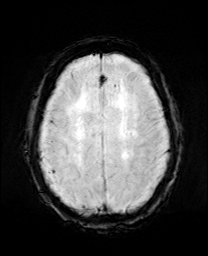
[im 60/60]
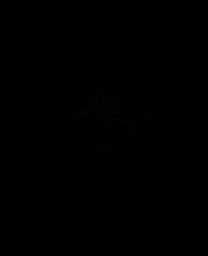

[Series 13: mip_images(sw) · axial · 24.0mm · 0.90mm/px · z∈[-62,+74]mm · 4 of 53 slices shown]
[im 1/53]
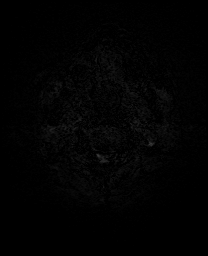
[im 18/53]
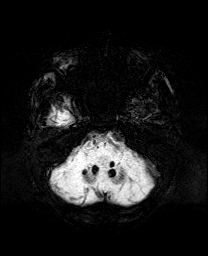
[im 35/53]
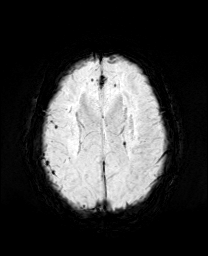
[im 53/53]
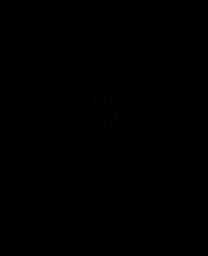

[Series 15: T2 · coronal · 5.0mm · 0.34mm/px · 2 of 29 slices shown (2 of 2)]
[im 1/29]
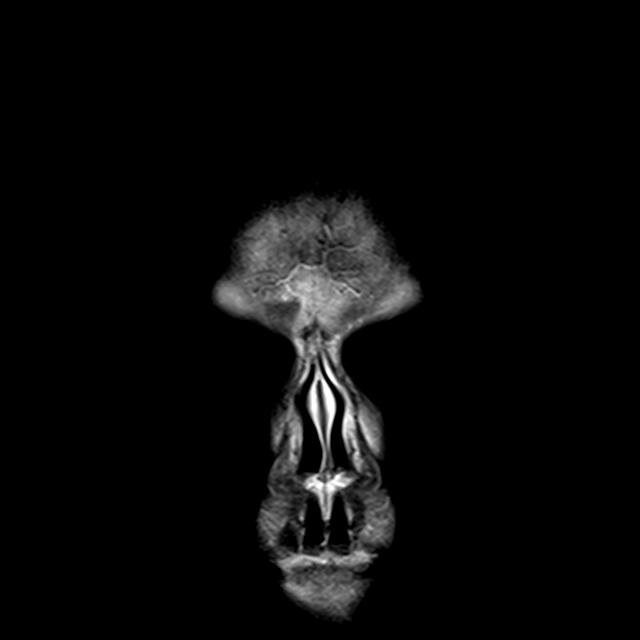
[im 29/29]
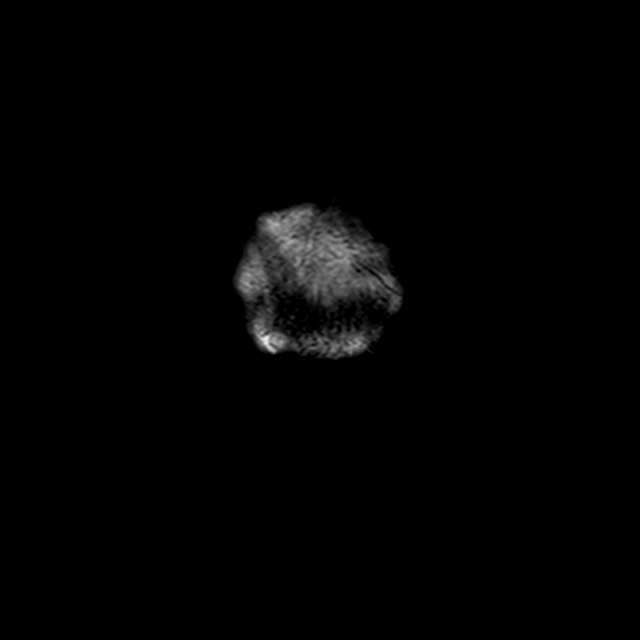

[33 of 48 positions shown; findings below may reference images not displayed]

FINDINGS: INTRACRANIAL CONTENTS: No reduced diffusion to suggest acute
ischemia. Numerous supra and infratentorial similar chronic
microhemorrhages to prior examination and random distribution. Mild
parenchymal brain volume loss. No hydrocephalus. Old small
cerebellar infarcts versus perivascular spaces. Old pontine,
bilateral basal ganglia and bilateral thalami lacunar infarcts.
Prominent basal ganglia and thalami perivascular spaces associated
chronic small vessel ischemic changes. Patchy to confluent
supratentorial and pontine white matter FLAIR T2 hyperintensities.
No midline shift, mass effect or masses. No abnormal extra-axial
fluid collections. No suspicious parenchymal signal, masses, mass
effect. No abnormal extra-axial fluid collections. No extra-axial
masses.

VASCULAR: Normal major intracranial vascular flow voids present at
skull base.

SKULL AND UPPER CERVICAL SPINE: No abnormal sellar expansion. No
suspicious calvarial bone marrow signal. ACDF. Widened atlantodental
interval craniocervical junction maintained.

SINUSES/ORBITS: The mastoid air-cells and included paranasal sinuses
are well-aerated.The included ocular globes and orbital contents are
non-suspicious.

OTHER: None.
IMPRESSION: 1. No acute intracranial process.
2. Multiple old supra and lacunar infarcts. Moderate to severe
chronic small vessel ischemic changes.
3. Chronic microhemorrhages in a distribution suggestive of chronic
hypertension, less likely amyloid angiopathy.
4. Mild parenchymal brain volume loss.

## 2019-04-26 DIAGNOSIS — D631 Anemia in chronic kidney disease: Secondary | ICD-10-CM | POA: Diagnosis not present

## 2019-04-26 DIAGNOSIS — N186 End stage renal disease: Secondary | ICD-10-CM | POA: Diagnosis not present

## 2019-04-26 DIAGNOSIS — N2581 Secondary hyperparathyroidism of renal origin: Secondary | ICD-10-CM | POA: Diagnosis not present

## 2019-04-28 DIAGNOSIS — D631 Anemia in chronic kidney disease: Secondary | ICD-10-CM | POA: Diagnosis not present

## 2019-04-28 DIAGNOSIS — N186 End stage renal disease: Secondary | ICD-10-CM | POA: Diagnosis not present

## 2019-04-28 DIAGNOSIS — N2581 Secondary hyperparathyroidism of renal origin: Secondary | ICD-10-CM | POA: Diagnosis not present

## 2019-05-01 DIAGNOSIS — N2581 Secondary hyperparathyroidism of renal origin: Secondary | ICD-10-CM | POA: Diagnosis not present

## 2019-05-01 DIAGNOSIS — D631 Anemia in chronic kidney disease: Secondary | ICD-10-CM | POA: Diagnosis not present

## 2019-05-01 DIAGNOSIS — N186 End stage renal disease: Secondary | ICD-10-CM | POA: Diagnosis not present

## 2019-05-03 DIAGNOSIS — D631 Anemia in chronic kidney disease: Secondary | ICD-10-CM | POA: Diagnosis not present

## 2019-05-03 DIAGNOSIS — N186 End stage renal disease: Secondary | ICD-10-CM | POA: Diagnosis not present

## 2019-05-03 DIAGNOSIS — N2581 Secondary hyperparathyroidism of renal origin: Secondary | ICD-10-CM | POA: Diagnosis not present

## 2019-05-05 DIAGNOSIS — N186 End stage renal disease: Secondary | ICD-10-CM | POA: Diagnosis not present

## 2019-05-05 DIAGNOSIS — D631 Anemia in chronic kidney disease: Secondary | ICD-10-CM | POA: Diagnosis not present

## 2019-05-05 DIAGNOSIS — N2581 Secondary hyperparathyroidism of renal origin: Secondary | ICD-10-CM | POA: Diagnosis not present

## 2019-05-08 DIAGNOSIS — D631 Anemia in chronic kidney disease: Secondary | ICD-10-CM | POA: Diagnosis not present

## 2019-05-08 DIAGNOSIS — N186 End stage renal disease: Secondary | ICD-10-CM | POA: Diagnosis not present

## 2019-05-08 DIAGNOSIS — N2581 Secondary hyperparathyroidism of renal origin: Secondary | ICD-10-CM | POA: Diagnosis not present

## 2019-05-10 DIAGNOSIS — N2581 Secondary hyperparathyroidism of renal origin: Secondary | ICD-10-CM | POA: Diagnosis not present

## 2019-05-10 DIAGNOSIS — N186 End stage renal disease: Secondary | ICD-10-CM | POA: Diagnosis not present

## 2019-05-10 DIAGNOSIS — D631 Anemia in chronic kidney disease: Secondary | ICD-10-CM | POA: Diagnosis not present

## 2019-05-12 DIAGNOSIS — D631 Anemia in chronic kidney disease: Secondary | ICD-10-CM | POA: Diagnosis not present

## 2019-05-12 DIAGNOSIS — N186 End stage renal disease: Secondary | ICD-10-CM | POA: Diagnosis not present

## 2019-05-12 DIAGNOSIS — N2581 Secondary hyperparathyroidism of renal origin: Secondary | ICD-10-CM | POA: Diagnosis not present

## 2019-05-15 DIAGNOSIS — N186 End stage renal disease: Secondary | ICD-10-CM | POA: Diagnosis not present

## 2019-05-15 DIAGNOSIS — Z992 Dependence on renal dialysis: Secondary | ICD-10-CM | POA: Diagnosis not present

## 2019-05-15 DIAGNOSIS — N2581 Secondary hyperparathyroidism of renal origin: Secondary | ICD-10-CM | POA: Diagnosis not present

## 2019-05-15 DIAGNOSIS — D631 Anemia in chronic kidney disease: Secondary | ICD-10-CM | POA: Diagnosis not present

## 2019-05-15 DIAGNOSIS — T8612 Kidney transplant failure: Secondary | ICD-10-CM | POA: Diagnosis not present

## 2019-05-17 DIAGNOSIS — D631 Anemia in chronic kidney disease: Secondary | ICD-10-CM | POA: Diagnosis not present

## 2019-05-17 DIAGNOSIS — N186 End stage renal disease: Secondary | ICD-10-CM | POA: Diagnosis not present

## 2019-05-17 DIAGNOSIS — N2581 Secondary hyperparathyroidism of renal origin: Secondary | ICD-10-CM | POA: Diagnosis not present

## 2019-05-19 DIAGNOSIS — D631 Anemia in chronic kidney disease: Secondary | ICD-10-CM | POA: Diagnosis not present

## 2019-05-19 DIAGNOSIS — N186 End stage renal disease: Secondary | ICD-10-CM | POA: Diagnosis not present

## 2019-05-19 DIAGNOSIS — N2581 Secondary hyperparathyroidism of renal origin: Secondary | ICD-10-CM | POA: Diagnosis not present

## 2019-05-22 DIAGNOSIS — N186 End stage renal disease: Secondary | ICD-10-CM | POA: Diagnosis not present

## 2019-05-22 DIAGNOSIS — D631 Anemia in chronic kidney disease: Secondary | ICD-10-CM | POA: Diagnosis not present

## 2019-05-22 DIAGNOSIS — N2581 Secondary hyperparathyroidism of renal origin: Secondary | ICD-10-CM | POA: Diagnosis not present

## 2019-05-24 DIAGNOSIS — N2581 Secondary hyperparathyroidism of renal origin: Secondary | ICD-10-CM | POA: Diagnosis not present

## 2019-05-24 DIAGNOSIS — D631 Anemia in chronic kidney disease: Secondary | ICD-10-CM | POA: Diagnosis not present

## 2019-05-24 DIAGNOSIS — N186 End stage renal disease: Secondary | ICD-10-CM | POA: Diagnosis not present

## 2019-05-25 NOTE — Telephone Encounter (Signed)
I spoke with the pt for an appt reminder for 6/16. Pt passed COVID19 screening questions and will bring mask. When I reminded patient to bring $200 to the appt she stated she did not have the money and is having a financial emergency. She stated she has Medicare A&B. Appt note says 3rd party. I advised patient I would d/w billing and call her back as appt may end up needing to be r/s if $200 is needed. Pt verbalized appreciation.

## 2019-05-25 NOTE — Telephone Encounter (Signed)
I clarified with [name] in billing that medicare will not pay for this visit. I spoke with pt and let her know she would need to bring $200 and medicare wouldn't pay. She verbalized understanding. She rescheduled for Tues 06/20/2019 @ 9:30 am arrival 15-30 minutes prior. Pt advised if she develops any symptoms of covid19 or is exposed to anyone who has it or is being tested close to appt to call for r/s. She verbalized understanding and appreciation.

## 2019-05-26 DIAGNOSIS — N186 End stage renal disease: Secondary | ICD-10-CM | POA: Diagnosis not present

## 2019-05-26 DIAGNOSIS — D631 Anemia in chronic kidney disease: Secondary | ICD-10-CM | POA: Diagnosis not present

## 2019-05-26 DIAGNOSIS — N2581 Secondary hyperparathyroidism of renal origin: Secondary | ICD-10-CM | POA: Diagnosis not present

## 2019-05-29 DIAGNOSIS — N2581 Secondary hyperparathyroidism of renal origin: Secondary | ICD-10-CM | POA: Diagnosis not present

## 2019-05-29 DIAGNOSIS — N186 End stage renal disease: Secondary | ICD-10-CM | POA: Diagnosis not present

## 2019-05-29 DIAGNOSIS — D631 Anemia in chronic kidney disease: Secondary | ICD-10-CM | POA: Diagnosis not present

## 2019-05-30 ENCOUNTER — Ambulatory Visit: Payer: Medicare Other | Admitting: Neurology

## 2019-05-31 DIAGNOSIS — D631 Anemia in chronic kidney disease: Secondary | ICD-10-CM | POA: Diagnosis not present

## 2019-05-31 DIAGNOSIS — N186 End stage renal disease: Secondary | ICD-10-CM | POA: Diagnosis not present

## 2019-05-31 DIAGNOSIS — N2581 Secondary hyperparathyroidism of renal origin: Secondary | ICD-10-CM | POA: Diagnosis not present

## 2019-06-02 DIAGNOSIS — D631 Anemia in chronic kidney disease: Secondary | ICD-10-CM | POA: Diagnosis not present

## 2019-06-02 DIAGNOSIS — N186 End stage renal disease: Secondary | ICD-10-CM | POA: Diagnosis not present

## 2019-06-02 DIAGNOSIS — N2581 Secondary hyperparathyroidism of renal origin: Secondary | ICD-10-CM | POA: Diagnosis not present

## 2019-06-05 DIAGNOSIS — D631 Anemia in chronic kidney disease: Secondary | ICD-10-CM | POA: Diagnosis not present

## 2019-06-05 DIAGNOSIS — N186 End stage renal disease: Secondary | ICD-10-CM | POA: Diagnosis not present

## 2019-06-05 DIAGNOSIS — N2581 Secondary hyperparathyroidism of renal origin: Secondary | ICD-10-CM | POA: Diagnosis not present

## 2019-06-07 DIAGNOSIS — N186 End stage renal disease: Secondary | ICD-10-CM | POA: Diagnosis not present

## 2019-06-07 DIAGNOSIS — D631 Anemia in chronic kidney disease: Secondary | ICD-10-CM | POA: Diagnosis not present

## 2019-06-07 DIAGNOSIS — N2581 Secondary hyperparathyroidism of renal origin: Secondary | ICD-10-CM | POA: Diagnosis not present

## 2019-06-09 DIAGNOSIS — N2581 Secondary hyperparathyroidism of renal origin: Secondary | ICD-10-CM | POA: Diagnosis not present

## 2019-06-09 DIAGNOSIS — D631 Anemia in chronic kidney disease: Secondary | ICD-10-CM | POA: Diagnosis not present

## 2019-06-09 DIAGNOSIS — N186 End stage renal disease: Secondary | ICD-10-CM | POA: Diagnosis not present

## 2019-06-09 DIAGNOSIS — D509 Iron deficiency anemia, unspecified: Secondary | ICD-10-CM | POA: Insufficient documentation

## 2019-06-12 DIAGNOSIS — N2581 Secondary hyperparathyroidism of renal origin: Secondary | ICD-10-CM | POA: Diagnosis not present

## 2019-06-12 DIAGNOSIS — N186 End stage renal disease: Secondary | ICD-10-CM | POA: Diagnosis not present

## 2019-06-12 DIAGNOSIS — D631 Anemia in chronic kidney disease: Secondary | ICD-10-CM | POA: Diagnosis not present

## 2019-06-14 DIAGNOSIS — N2581 Secondary hyperparathyroidism of renal origin: Secondary | ICD-10-CM | POA: Diagnosis not present

## 2019-06-14 DIAGNOSIS — N186 End stage renal disease: Secondary | ICD-10-CM | POA: Diagnosis not present

## 2019-06-14 DIAGNOSIS — Z992 Dependence on renal dialysis: Secondary | ICD-10-CM | POA: Diagnosis not present

## 2019-06-14 DIAGNOSIS — D509 Iron deficiency anemia, unspecified: Secondary | ICD-10-CM | POA: Diagnosis not present

## 2019-06-14 DIAGNOSIS — T8612 Kidney transplant failure: Secondary | ICD-10-CM | POA: Diagnosis not present

## 2019-06-14 DIAGNOSIS — D631 Anemia in chronic kidney disease: Secondary | ICD-10-CM | POA: Diagnosis not present

## 2019-06-16 DIAGNOSIS — N2581 Secondary hyperparathyroidism of renal origin: Secondary | ICD-10-CM | POA: Diagnosis not present

## 2019-06-16 DIAGNOSIS — N186 End stage renal disease: Secondary | ICD-10-CM | POA: Diagnosis not present

## 2019-06-16 DIAGNOSIS — D631 Anemia in chronic kidney disease: Secondary | ICD-10-CM | POA: Diagnosis not present

## 2019-06-16 DIAGNOSIS — D509 Iron deficiency anemia, unspecified: Secondary | ICD-10-CM | POA: Diagnosis not present

## 2019-06-19 DIAGNOSIS — D631 Anemia in chronic kidney disease: Secondary | ICD-10-CM | POA: Diagnosis not present

## 2019-06-19 DIAGNOSIS — N2581 Secondary hyperparathyroidism of renal origin: Secondary | ICD-10-CM | POA: Diagnosis not present

## 2019-06-19 DIAGNOSIS — N186 End stage renal disease: Secondary | ICD-10-CM | POA: Diagnosis not present

## 2019-06-19 DIAGNOSIS — D509 Iron deficiency anemia, unspecified: Secondary | ICD-10-CM | POA: Diagnosis not present

## 2019-06-20 ENCOUNTER — Other Ambulatory Visit: Payer: Self-pay

## 2019-06-20 ENCOUNTER — Encounter: Payer: Self-pay | Admitting: Neurology

## 2019-06-20 ENCOUNTER — Ambulatory Visit (INDEPENDENT_AMBULATORY_CARE_PROVIDER_SITE_OTHER): Payer: Medicare Other | Admitting: Neurology

## 2019-06-20 VITALS — BP 129/72 | HR 86 | Temp 97.9°F | Ht 62.0 in | Wt 123.0 lb

## 2019-06-20 DIAGNOSIS — H9213 Otorrhea, bilateral: Secondary | ICD-10-CM | POA: Diagnosis not present

## 2019-06-20 DIAGNOSIS — R42 Dizziness and giddiness: Secondary | ICD-10-CM

## 2019-06-20 DIAGNOSIS — Z8673 Personal history of transient ischemic attack (TIA), and cerebral infarction without residual deficits: Secondary | ICD-10-CM

## 2019-06-20 NOTE — Progress Notes (Signed)
[name]    Provider:  Dr [name] Referring Provider: [name], MD Primary Care Physician:  Kathryn Carol, MD  CC:  Dizziness after dialysis and "juicy ears"  Interval history 06/20/2019:  68 year old seen here for memory changes in the past,  and today new referral for some vague dizziness.She has extreme stress and caring for her mother with dementia. PMHx stroke, HTN, HLD, headache, depression with anxiety, ESRD on dialysis, anemia, CHF, HTN, HLD, prior CVA. She says her ears are "juicy" and she has to have her ears cleaned out. She says she has some dizziness, last vertigo was 3 months ago. She haas dizziness when she gets off of dialysis, no other times other than when walking and turns the wrong way. No vertigo for months. Dizziness is mild and brief and rare, and resolves on it own. She does not drink fluids, she tries not to drink fluids. She is on a fixed income, she does not have the $200 for this appointment and doesn't think it is neurologic. She denies any other association with her dizziness.  MRI brain: 09/16/2018: IMPRESSION: reviewed images and agree with the following 1. No acute intracranial process. 2. Multiple old supra and lacunar infarcts. Moderate to severe chronic small vessel ischemic changes. 3. Chronic microhemorrhages in a distribution suggestive of chronic hypertension, less likely amyloid angiopathy. 4. Mild parenchymal brain volume loss.  HPI 07/13/2018:  [name] is a 68 y.o. female here as a referral from Dr. Delfina West for new problem memory loss. PMHx . Since her stroke she feels like her memory is impaired. Her mother has dementia and she is caring for her so there is stress. She cares for her mother. She has difficulty with long-term memory, trying to remember things that used to be. She says her mother gets on her nerves and makes her forget things, she talks awful to her. Doesn't have any problems with year or month but may  have difficulty with day. No repeating things. She is caring for the household, cleaning, shopping. She does not miss medications and she manage mom's medications. Her son pays bills. She drive, no accidents, no accidents in the home. She forgets what day it is. Mom with dementia unknown what kind. Progressively worsening. She has personality changes, aggravated, saying things that she wouldn't, agitation.   Reviewed notes, labs and imaging from outside physicians, which showed:  Reviewed CT of the head images and agree:  1. Scattered white matter changes. No acute intracranial abnormality. 2. Widening of the pre odontoid space and malalignment of the first and second spinal laminal lines, unchanged since 2015. This is not an acute finding and the patient reports no trauma. Recommend clinical correlation. Dedicated CT imaging of the cervical spine could of course better evaluate if clinically warranted.  Review of Systems: Patient complains of symptoms per HPI as well as the following symptoms: memory loss, anemia, dizziness. Pertinent negatives and positives per HPI. All others negative.   Social History   Socioeconomic History  . Marital status: Widowed    Spouse name: Not on file  . Number of children: 2  . Years of education: Not on file  . Highest education level: Not on file  Occupational History  . Not on file  Social Needs  . Financial resource strain: Not on file  . Food insecurity    Worry: Not on file    Inability: Not on file  . Transportation needs    Medical: Not on  file    Non-medical: Not on file  Tobacco Use  . Smoking status: Former Smoker    Types: Cigarettes    Quit date: 12/31/1991    Years since quitting: 27.4  . Smokeless tobacco: Never Used  Substance and Sexual Activity  . Alcohol use: No    Alcohol/week: 0.0 standard drinks  . Drug use: No  . Sexual activity: Never  Lifestyle  . Physical activity    Days per week: Not on file    Minutes per  session: Not on file  . Stress: Not on file  Relationships  . Social Herbalist on phone: Not on file    Gets together: Not on file    Attends religious service: Not on file    Active member of club or organization: Not on file    Attends meetings of clubs or organizations: Not on file    Relationship status: Not on file  . Intimate partner violence    Fear of current or ex partner: Not on file    Emotionally abused: Not on file    Physically abused: Not on file    Forced sexual activity: Not on file  Other Topics Concern  . Not on file  Social History Narrative   Living with her mother    Right handed   Caffeine: only decaf clears ("no brown sodas" or coffee d/t kidney disease)   Low potassium foods d/t kidney disease    Family History  Problem Relation Age of Onset  . Colon cancer Brother   . Cancer Brother   . Coronary artery disease Mother 71  . Hyperlipidemia Mother   . Hypertension Mother   . Stroke Maternal Aunt   . Esophageal cancer Neg Hx   . Stomach cancer Neg Hx   . Rectal cancer Neg Hx     Past Medical History:  Diagnosis Date  . Adenomatous polyp of colon 10/2010, 2006, 2015  . Anemia in CKD (chronic kidney disease) 11/07/2012   s/p blood transfusion.   . Arthritis   . CAD (coronary artery disease)    "something like that"  . CHF (congestive heart failure) ([name])   . Constipation   . Depression with anxiety   . Dialysis patient ([name])    Mon-Wed-Fri  . Diverticula, colon   . 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  Had deceased donor kidney transplant in 1996.  Had some early rejection then stable function for years, then had slow decline of function and went back on hemodialysis in 2012.  [name] still using L forearm AVF.     Marland Kitchen GERD (gastroesophageal reflux disease)   . GI bleed 2017   felt to be ischemic colitis,  last colo 2015  . Headache   . Hypercholesterolemia   . Hyperlipidemia   . Hypertension   . Neurologic gait dysfunction   . Neuromuscular disorder (HCC)    neuropathy hand and legs  . Osteoporosis   . Pneumonia   . 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 Stroke [facility]) 11/2015   TIA  . Stroke-like symptoms 11/2015  . Vertigo   . 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 Weight loss, unintentional     Past Surgical History:  Procedure Laterality Date  .  AV FISTULA PLACEMENT     for dialysis  . AV FISTULA PLACEMENT Left 11/22/2015   Procedure: ARTERIOVENOUS (AV) FISTULA CREATION-LEFT BRACHIOCEPHALIC;  Surgeon: Serafina Mitchell, MD;  Location: [place];  Service: Vascular;  Laterality: Left;  . BACK SURGERY    . CERVICAL FUSION    . CHOLECYSTECTOMY  12/02/2012   Procedure: LAPAROSCOPIC CHOLECYSTECTOMY WITH INTRAOPERATIVE CHOLANGIOGRAM;  Surgeon: Edward Jolly, MD;  Location: [place];  Service: General;  Laterality: N/A;  . EYE SURGERY Bilateral    cataract surgery  . EYE SURGERY Left 2019   laser  . HEMATOMA EVACUATION Left 12/24/2016   Procedure: EVACUATION HEMATOMA LEFT UPPER ARM;  Surgeon: Waynetta Sandy, MD;  Location: [place];  Service: Vascular;  Laterality: Left;  . I&D EXTREMITY Left 12/31/2016   Procedure: IRRIGATION AND DEBRIDEMENT EXTREMITY;  Surgeon: Angelia Mould, MD;  Location: [place];  Service: Vascular;  Laterality: Left;  . INSERTION OF DIALYSIS CATHETER Right 12/24/2016   Procedure: INSERTION OF DIALYSIS CATHETER;  Surgeon: Waynetta Sandy, MD;  Location: [place];  Service: Vascular;  Laterality: Right;  . INSERTION OF DIALYSIS CATHETER Right 02/04/2017   Procedure: INSERTION OF DIALYSIS CATHETER;  Surgeon: Waynetta Sandy, MD;  Location: [place];  Service: Vascular;  Laterality: Right;  . KIDNEY TRANSPLANT  1996  . PERIPHERAL VASCULAR CATHETERIZATION Left 10/23/2016   Procedure:  Fistulagram;  Surgeon: Elam Dutch, MD;  Location: [place] [facility];  Service: Cardiovascular;  Laterality: Left;  . RESECTION OF ARTERIOVENOUS FISTULA ANEURYSM Left 11/22/2015   Procedure: RESECTION OF LEFT RADIOCEPHALIC FISTULA ANEURYSM ;  Surgeon: Serafina Mitchell, MD;  Location: [place];  Service: Vascular;  Laterality: Left;  . REVISON OF ARTERIOVENOUS FISTULA Left 12/22/2016   Procedure: REVISON OF LEFT ARTERIOVENOUS FISTULA;  Surgeon: Waynetta Sandy, MD;  Location: [place];  Service: Vascular;  Laterality: Left;  . REVISON OF ARTERIOVENOUS FISTULA Left 02/04/2017   Procedure: REVISON OF LEFT UPPER ARM ARTERIOVENOUS FISTULA;  Surgeon: Waynetta Sandy, MD;  Location: [place];  Service: Vascular;  Laterality: Left;    Current Outpatient Medications  Medication Sig Dispense Refill  . acetaminophen (TYLENOL) 500 MG tablet Take 1 tablet (500 mg total) by mouth every 6 (six) hours as needed. 30 tablet 0  . allopurinol (ZYLOPRIM) 300 MG tablet Take 150 mg by mouth at bedtime.    . ALPRAZolam (XANAX) 0.25 MG tablet Take 0.25 mg by mouth daily as needed for anxiety.     . B Complex-C-Folic Acid (DIALYVITE 353) 0.8 MG WAFR Take 1 Wafer by mouth daily.  11  . B Complex-C-Folic Acid (DIALYVITE PO) Take 1 tablet by mouth daily.    . calcium carbonate (TUMS - DOSED IN MG ELEMENTAL CALCIUM) 500 MG chewable tablet Chew 1 tablet by mouth 4 (four) times daily.    . clopidogrel (PLAVIX) 75 MG tablet Take 1 tablet (75 mg total) by mouth daily. (Patient not taking: Reported on 12/13/2018) 30 tablet 0  . diclofenac sodium (VOLTAREN) 1 % GEL Apply 2 g topically 4 (four) times daily. 100 g 0  . doxazosin (CARDURA) 8 MG tablet Take 1 tablet (8 mg total) by mouth at bedtime.    Marland Kitchen HYDROcodone-acetaminophen (NORCO/VICODIN) 5-325 MG tablet Take 1-2 tablets by mouth every 6 hours as needed for pain and/or cough. 7 tablet 0  . labetalol (NORMODYNE) 300 MG tablet Take 300 mg by mouth 2 (two) times daily.     Marland Kitchen lactulose (CHRONULAC) 10 GM/15ML  solution Take 20 g by mouth daily as needed for mild constipation.    Marland Kitchen lanthanum (FOSRENOL) 1000 MG chewable tablet Chew 1,000 mg by mouth 3 (three) times daily.     Marland Kitchen losartan (COZAAR) 50 MG tablet Take 1 tablet (50 mg total) by mouth at bedtime. 30 tablet 0  . meclizine (ANTIVERT) 25 MG tablet Take 1 tablet (25 mg total) by mouth 2 (two) times daily as needed for dizziness. 30 tablet 0  . methocarbamol (ROBAXIN) 500 MG tablet Take 1 tablet (500 mg total) by mouth 2 (two) times daily. 20 tablet 0  . ondansetron (ZOFRAN) 8 MG tablet Take 8 mg by mouth 2 (two) times daily as needed for nausea or vomiting.   0  . SENSIPAR 30 MG tablet Take 30 mg by mouth every Monday, Wednesday, and Friday with hemodialysis.     Marland Kitchen simvastatin (ZOCOR) 20 MG tablet Take 1 tablet (20 mg total) by mouth at bedtime. (Patient taking differently: Take 10 mg by mouth at bedtime. ) 30 tablet 0  . zolpidem (AMBIEN) 10 MG tablet Take 10 mg by mouth at bedtime as needed for sleep.      No current facility-administered medications for this visit.     Allergies as of 06/20/2019 - Review Complete 06/20/2019  Allergen Reaction Noted  . Sulfa antibiotics Other (See Comments) 11/06/2012  . Adhesive [tape] Itching 12/29/2016    Vitals: BP 129/72 (BP Location: Right Arm, Patient Position: Sitting)   Pulse 86   Temp 97.9 F (36.6 C)   Ht 5\' 2"  (1.575 m)   Wt 123 lb (55.8 kg)   BMI 22.50 kg/m  Last Weight:  Wt Readings from Last 1 Encounters:  06/20/19 123 lb (55.8 kg)   Last Height:   Ht Readings from Last 1 Encounters:  06/20/19 5\' 2"  (1.575 m)   Physical exam: Exam: Gen: NAD CV: RRR, no MRG. No Carotid Bruits. No peripheral edema, warm, nontender Eyes: Conjunctivae clear without exudates or hemorrhage  Neuro: Detailed Neurologic Exam  Speech:    Speech is normal; fluent and spontaneous with normal comprehension.  Cognition:  MMSE - Mini Mental State Exam 07/13/2018   Orientation to time 3  Orientation to Place 3  Registration 3  Attention/ Calculation 1  Recall 2  Language- name 2 objects 2  Language- repeat 1  Language- follow 3 step command 3  Language- read & follow direction 1  Write a sentence 1  Copy design 1  Total score 21      The patient is oriented to person, place, and time;     recent and remote memory impaired;     language fluent;     Impaired attention, concentration, fund of knowledge Cranial Nerves:    The pupils are equal, round, and reactive to light. Attempted fundosopic exam could not visualize. Visual fields are full to finger confrontation. Extraocular movements are intact. Trigeminal sensation is intact and the muscles of mastication are normal. The face is symmetric. The palate elevates in the midline. Hearing intact. Voice is normal. Shoulder shrug is normal. The tongue has normal motion without fasciculations.   Gait:    slighty antalgic, with a cane, stable  Motor Observation:    No asymmetry, no atrophy, and no involuntary movements noted. Tone:    Normal muscle tone.    Posture:    Posture is normal. normal erect    Strength:    Very poor effort but strength appears symmetric  Sensation: intact to LT     Reflex Exam:  DTR's:    Deep tendon reflexes in the upper and lower extremities are symmetric bilaterally.   Toes:    The toes are equiv bilaterally.   Clonus:    Clonus is absent.       Assessment/Plan:   68 year old seen here for memory changes in the past,  and today new referral for some vague dizziness.She has extreme stress and caring for her mother with dementia. PMHx stroke, HTN, HLD, headache, depression with anxiety, ESRD on dialysis, anemia, CHF, HTN, HLD, prior CVA.  - She has some dizziness after dialysis,need to discuss with dialysis and nephrologist, she is also anemic, likely due to fluid shifts during dialysis - She says her ears are "juicy" and she has to have her ears  cleaned out. I will not charge her today, she would have to pay out of pocket $200(unclear, 3rd party? MVA?) for some vague dizziness after dialysis and vertigo 3 months ago resolved. Will send to ENT, neuro exam stable. Dizziness is mild and brief and rare, and resolves on it own.  She also does not drink fluids and is anemic, follow up with Dr. Delfina West and her other specialists. she does not have the $200 for this appointment  I won't charge her and send her to ENT and release back to pcp but come back if symptoms progress, worsen, go to ED for acute symptoms or stroke symptoms - follow closely with pcp for vascular risk factors and stroke prevention. Follow up here in 6 months with our stroke team Jessica.  - - Memory complaints in the setting of extreme stress and caring for mother with dementia. 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 Return to pcp for treatment of mood disorder and needs therapy. May consider vescular causes of dementia or MCI, can consider formal memory testing in the future. She reports she is stable, no changes in memory.  PRIOR evaluation:  - Memory complaints in the setting of extreme stress and caring for mother with dementia. 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 Return to pcp for treatment of mood disorder and needs therapy. May consider vescular causes of dementia or MCI, can consider formal memory testing int he future.  - TSh and B12 normal  - MRI of the brain and labs were ordered - can consider FDG PET Scan in the future or formal memory testing - Discussed memory compensation strategies.  Orders Placed This Encounter  Procedures  . Ambulatory referral to ENT   I had a d/w patient about her recent stroke, risk for recurrent stroke/TIAs, personally independently reviewed imaging studies and stroke evaluation results and answered questions.Continue Plavix for secondary stroke prevention and maintain strict control of hypertension  with blood pressure goal below 130/90, diabetes with hemoglobin A1c goal below 6.5% and lipids with LDL cholesterol goal below 70 mg/dL. I also advised the patient to eat a healthy diet with plenty of whole grains, cereals, fruits and vegetables, exercise regularly and maintain ideal body weight .Discussed stroke prevention.   Sarina Ill, MD  [facility] [address] [place], [place] [address]  Phone [phone] Fax [phone]  A total of 30 minutes was spent face-to-face with this patient. Over half this time was spent on counseling patient on the  1. Dizziness   2. Ear discharge of both ears   3. History of stroke    diagnosis and different  diagnostic and therapeutic options, counseling and coordination of care, risks ans benefits of management, compliance, or risk factor reduction and education.

## 2019-06-21 DIAGNOSIS — N186 End stage renal disease: Secondary | ICD-10-CM | POA: Diagnosis not present

## 2019-06-21 DIAGNOSIS — D509 Iron deficiency anemia, unspecified: Secondary | ICD-10-CM | POA: Diagnosis not present

## 2019-06-21 DIAGNOSIS — D631 Anemia in chronic kidney disease: Secondary | ICD-10-CM | POA: Diagnosis not present

## 2019-06-21 DIAGNOSIS — N2581 Secondary hyperparathyroidism of renal origin: Secondary | ICD-10-CM | POA: Diagnosis not present

## 2019-06-22 DIAGNOSIS — I10 Essential (primary) hypertension: Secondary | ICD-10-CM | POA: Diagnosis not present

## 2019-06-22 DIAGNOSIS — R413 Other amnesia: Secondary | ICD-10-CM | POA: Diagnosis not present

## 2019-06-22 DIAGNOSIS — R42 Dizziness and giddiness: Secondary | ICD-10-CM | POA: Diagnosis not present

## 2019-06-22 DIAGNOSIS — F419 Anxiety disorder, unspecified: Secondary | ICD-10-CM | POA: Diagnosis not present

## 2019-06-22 DIAGNOSIS — N186 End stage renal disease: Secondary | ICD-10-CM | POA: Diagnosis not present

## 2019-06-22 DIAGNOSIS — G3184 Mild cognitive impairment, so stated: Secondary | ICD-10-CM | POA: Diagnosis not present

## 2019-06-22 DIAGNOSIS — F329 Major depressive disorder, single episode, unspecified: Secondary | ICD-10-CM | POA: Diagnosis not present

## 2019-06-22 DIAGNOSIS — E7800 Pure hypercholesterolemia, unspecified: Secondary | ICD-10-CM | POA: Diagnosis not present

## 2019-06-23 DIAGNOSIS — D509 Iron deficiency anemia, unspecified: Secondary | ICD-10-CM | POA: Diagnosis not present

## 2019-06-23 DIAGNOSIS — D631 Anemia in chronic kidney disease: Secondary | ICD-10-CM | POA: Diagnosis not present

## 2019-06-23 DIAGNOSIS — N186 End stage renal disease: Secondary | ICD-10-CM | POA: Diagnosis not present

## 2019-06-23 DIAGNOSIS — N2581 Secondary hyperparathyroidism of renal origin: Secondary | ICD-10-CM | POA: Diagnosis not present

## 2019-06-26 DIAGNOSIS — D631 Anemia in chronic kidney disease: Secondary | ICD-10-CM | POA: Diagnosis not present

## 2019-06-26 DIAGNOSIS — N2581 Secondary hyperparathyroidism of renal origin: Secondary | ICD-10-CM | POA: Diagnosis not present

## 2019-06-26 DIAGNOSIS — D509 Iron deficiency anemia, unspecified: Secondary | ICD-10-CM | POA: Diagnosis not present

## 2019-06-26 DIAGNOSIS — N186 End stage renal disease: Secondary | ICD-10-CM | POA: Diagnosis not present

## 2019-06-28 DIAGNOSIS — N2581 Secondary hyperparathyroidism of renal origin: Secondary | ICD-10-CM | POA: Diagnosis not present

## 2019-06-28 DIAGNOSIS — N186 End stage renal disease: Secondary | ICD-10-CM | POA: Diagnosis not present

## 2019-06-28 DIAGNOSIS — D631 Anemia in chronic kidney disease: Secondary | ICD-10-CM | POA: Diagnosis not present

## 2019-06-28 DIAGNOSIS — D509 Iron deficiency anemia, unspecified: Secondary | ICD-10-CM | POA: Diagnosis not present

## 2019-06-30 DIAGNOSIS — N186 End stage renal disease: Secondary | ICD-10-CM | POA: Diagnosis not present

## 2019-06-30 DIAGNOSIS — D631 Anemia in chronic kidney disease: Secondary | ICD-10-CM | POA: Diagnosis not present

## 2019-06-30 DIAGNOSIS — D509 Iron deficiency anemia, unspecified: Secondary | ICD-10-CM | POA: Diagnosis not present

## 2019-06-30 DIAGNOSIS — N2581 Secondary hyperparathyroidism of renal origin: Secondary | ICD-10-CM | POA: Diagnosis not present

## 2019-07-03 DIAGNOSIS — N2581 Secondary hyperparathyroidism of renal origin: Secondary | ICD-10-CM | POA: Diagnosis not present

## 2019-07-03 DIAGNOSIS — N186 End stage renal disease: Secondary | ICD-10-CM | POA: Diagnosis not present

## 2019-07-03 DIAGNOSIS — D509 Iron deficiency anemia, unspecified: Secondary | ICD-10-CM | POA: Diagnosis not present

## 2019-07-03 DIAGNOSIS — D631 Anemia in chronic kidney disease: Secondary | ICD-10-CM | POA: Diagnosis not present

## 2019-07-05 DIAGNOSIS — D631 Anemia in chronic kidney disease: Secondary | ICD-10-CM | POA: Diagnosis not present

## 2019-07-05 DIAGNOSIS — N186 End stage renal disease: Secondary | ICD-10-CM | POA: Diagnosis not present

## 2019-07-05 DIAGNOSIS — N2581 Secondary hyperparathyroidism of renal origin: Secondary | ICD-10-CM | POA: Diagnosis not present

## 2019-07-05 DIAGNOSIS — D509 Iron deficiency anemia, unspecified: Secondary | ICD-10-CM | POA: Diagnosis not present

## 2019-07-07 DIAGNOSIS — D509 Iron deficiency anemia, unspecified: Secondary | ICD-10-CM | POA: Diagnosis not present

## 2019-07-07 DIAGNOSIS — N2581 Secondary hyperparathyroidism of renal origin: Secondary | ICD-10-CM | POA: Diagnosis not present

## 2019-07-07 DIAGNOSIS — N186 End stage renal disease: Secondary | ICD-10-CM | POA: Diagnosis not present

## 2019-07-07 DIAGNOSIS — D631 Anemia in chronic kidney disease: Secondary | ICD-10-CM | POA: Diagnosis not present

## 2019-07-10 DIAGNOSIS — D509 Iron deficiency anemia, unspecified: Secondary | ICD-10-CM | POA: Diagnosis not present

## 2019-07-10 DIAGNOSIS — N2581 Secondary hyperparathyroidism of renal origin: Secondary | ICD-10-CM | POA: Diagnosis not present

## 2019-07-10 DIAGNOSIS — D631 Anemia in chronic kidney disease: Secondary | ICD-10-CM | POA: Diagnosis not present

## 2019-07-10 DIAGNOSIS — N186 End stage renal disease: Secondary | ICD-10-CM | POA: Diagnosis not present

## 2019-07-12 DIAGNOSIS — D631 Anemia in chronic kidney disease: Secondary | ICD-10-CM | POA: Diagnosis not present

## 2019-07-12 DIAGNOSIS — N186 End stage renal disease: Secondary | ICD-10-CM | POA: Diagnosis not present

## 2019-07-12 DIAGNOSIS — D509 Iron deficiency anemia, unspecified: Secondary | ICD-10-CM | POA: Diagnosis not present

## 2019-07-12 DIAGNOSIS — N2581 Secondary hyperparathyroidism of renal origin: Secondary | ICD-10-CM | POA: Diagnosis not present

## 2019-07-14 DIAGNOSIS — N186 End stage renal disease: Secondary | ICD-10-CM | POA: Diagnosis not present

## 2019-07-14 DIAGNOSIS — D631 Anemia in chronic kidney disease: Secondary | ICD-10-CM | POA: Diagnosis not present

## 2019-07-14 DIAGNOSIS — N2581 Secondary hyperparathyroidism of renal origin: Secondary | ICD-10-CM | POA: Diagnosis not present

## 2019-07-14 DIAGNOSIS — D509 Iron deficiency anemia, unspecified: Secondary | ICD-10-CM | POA: Diagnosis not present

## 2019-07-15 DIAGNOSIS — T8612 Kidney transplant failure: Secondary | ICD-10-CM | POA: Diagnosis not present

## 2019-07-15 DIAGNOSIS — Z992 Dependence on renal dialysis: Secondary | ICD-10-CM | POA: Diagnosis not present

## 2019-07-15 DIAGNOSIS — N186 End stage renal disease: Secondary | ICD-10-CM | POA: Diagnosis not present

## 2019-07-17 DIAGNOSIS — D631 Anemia in chronic kidney disease: Secondary | ICD-10-CM | POA: Diagnosis not present

## 2019-07-17 DIAGNOSIS — N186 End stage renal disease: Secondary | ICD-10-CM | POA: Diagnosis not present

## 2019-07-17 DIAGNOSIS — N2581 Secondary hyperparathyroidism of renal origin: Secondary | ICD-10-CM | POA: Diagnosis not present

## 2019-07-17 DIAGNOSIS — Z992 Dependence on renal dialysis: Secondary | ICD-10-CM | POA: Diagnosis not present

## 2019-07-19 DIAGNOSIS — D631 Anemia in chronic kidney disease: Secondary | ICD-10-CM | POA: Diagnosis not present

## 2019-07-19 DIAGNOSIS — N2581 Secondary hyperparathyroidism of renal origin: Secondary | ICD-10-CM | POA: Diagnosis not present

## 2019-07-19 DIAGNOSIS — Z992 Dependence on renal dialysis: Secondary | ICD-10-CM | POA: Diagnosis not present

## 2019-07-19 DIAGNOSIS — N186 End stage renal disease: Secondary | ICD-10-CM | POA: Diagnosis not present

## 2019-07-21 DIAGNOSIS — N186 End stage renal disease: Secondary | ICD-10-CM | POA: Diagnosis not present

## 2019-07-21 DIAGNOSIS — N2581 Secondary hyperparathyroidism of renal origin: Secondary | ICD-10-CM | POA: Diagnosis not present

## 2019-07-21 DIAGNOSIS — D631 Anemia in chronic kidney disease: Secondary | ICD-10-CM | POA: Diagnosis not present

## 2019-07-21 DIAGNOSIS — Z992 Dependence on renal dialysis: Secondary | ICD-10-CM | POA: Diagnosis not present

## 2019-07-22 IMAGING — DX DG CHEST 2V
2 series · 2 of 2 positions shown · non-contrast
Comparison: 09/05/2018.

CLINICAL DATA: Weakness. Shortness of breath.  Hemodialysis.

EXAM:
CHEST - 2 VIEW

[x chest ap]
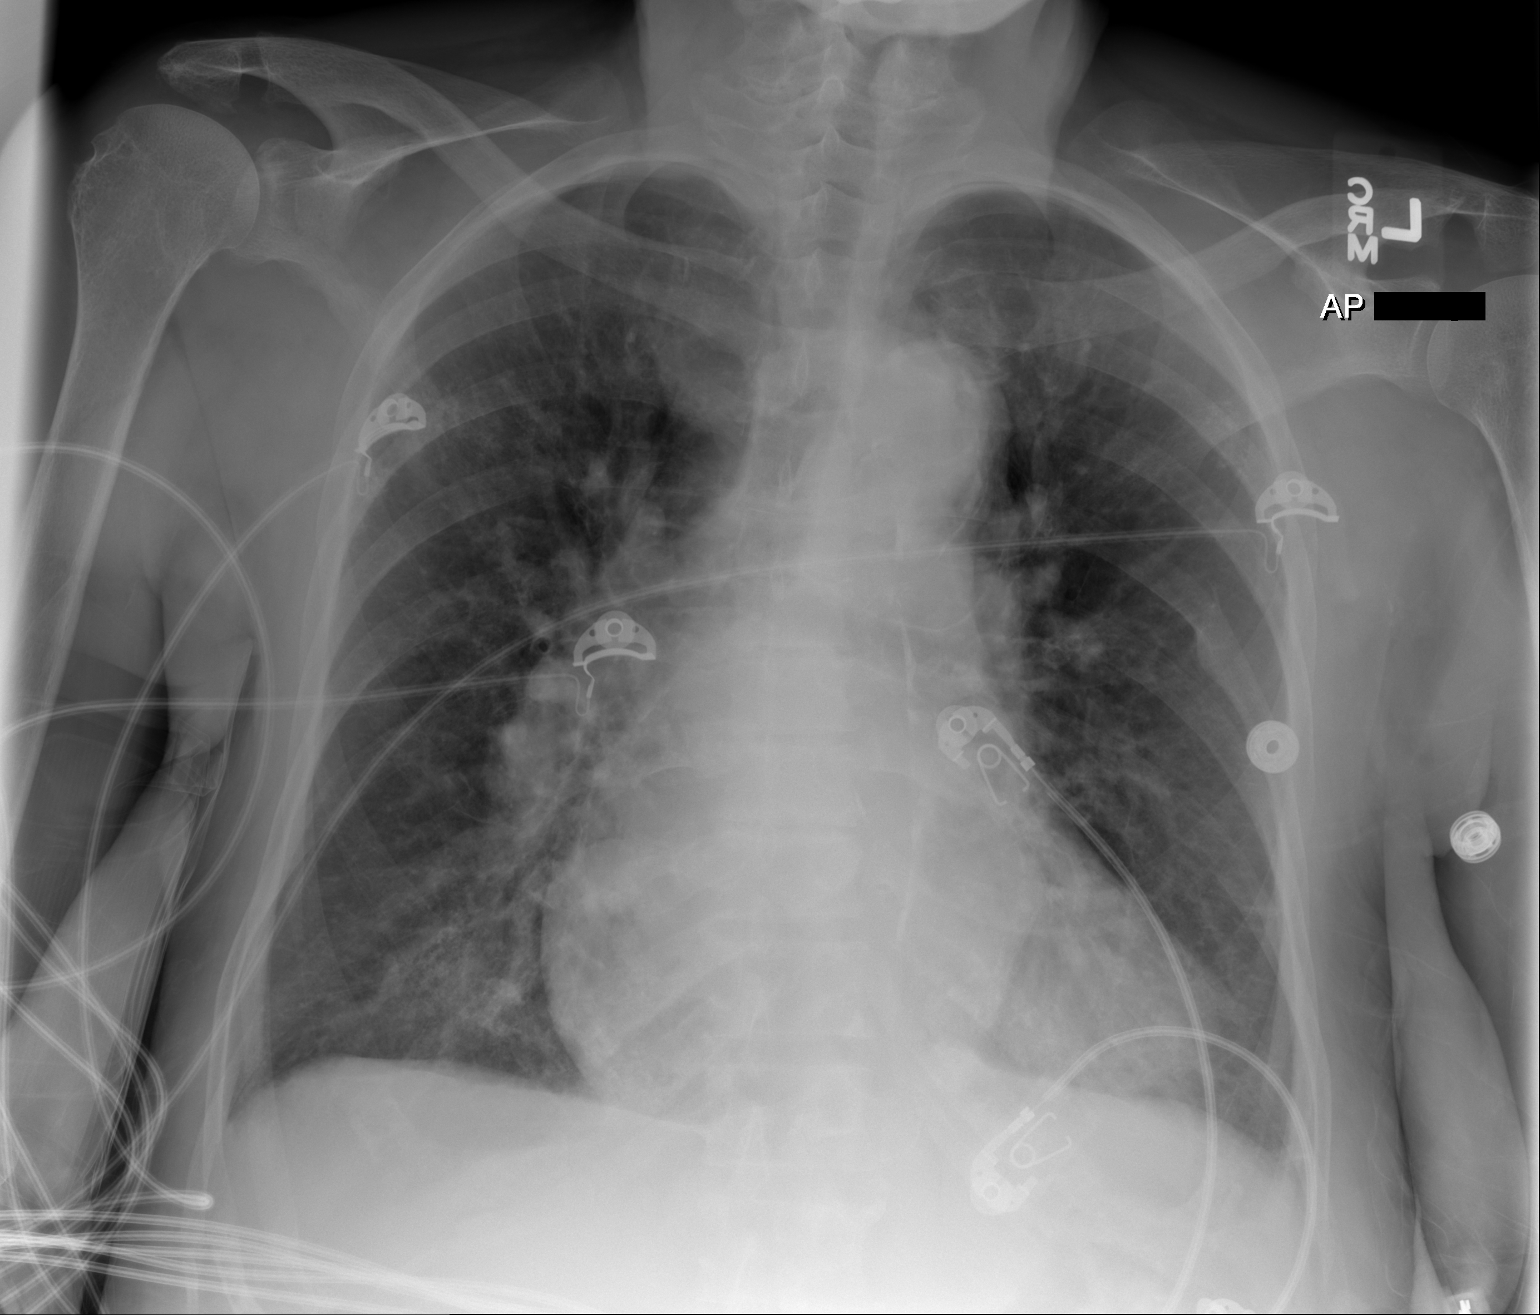

[w chest lat]
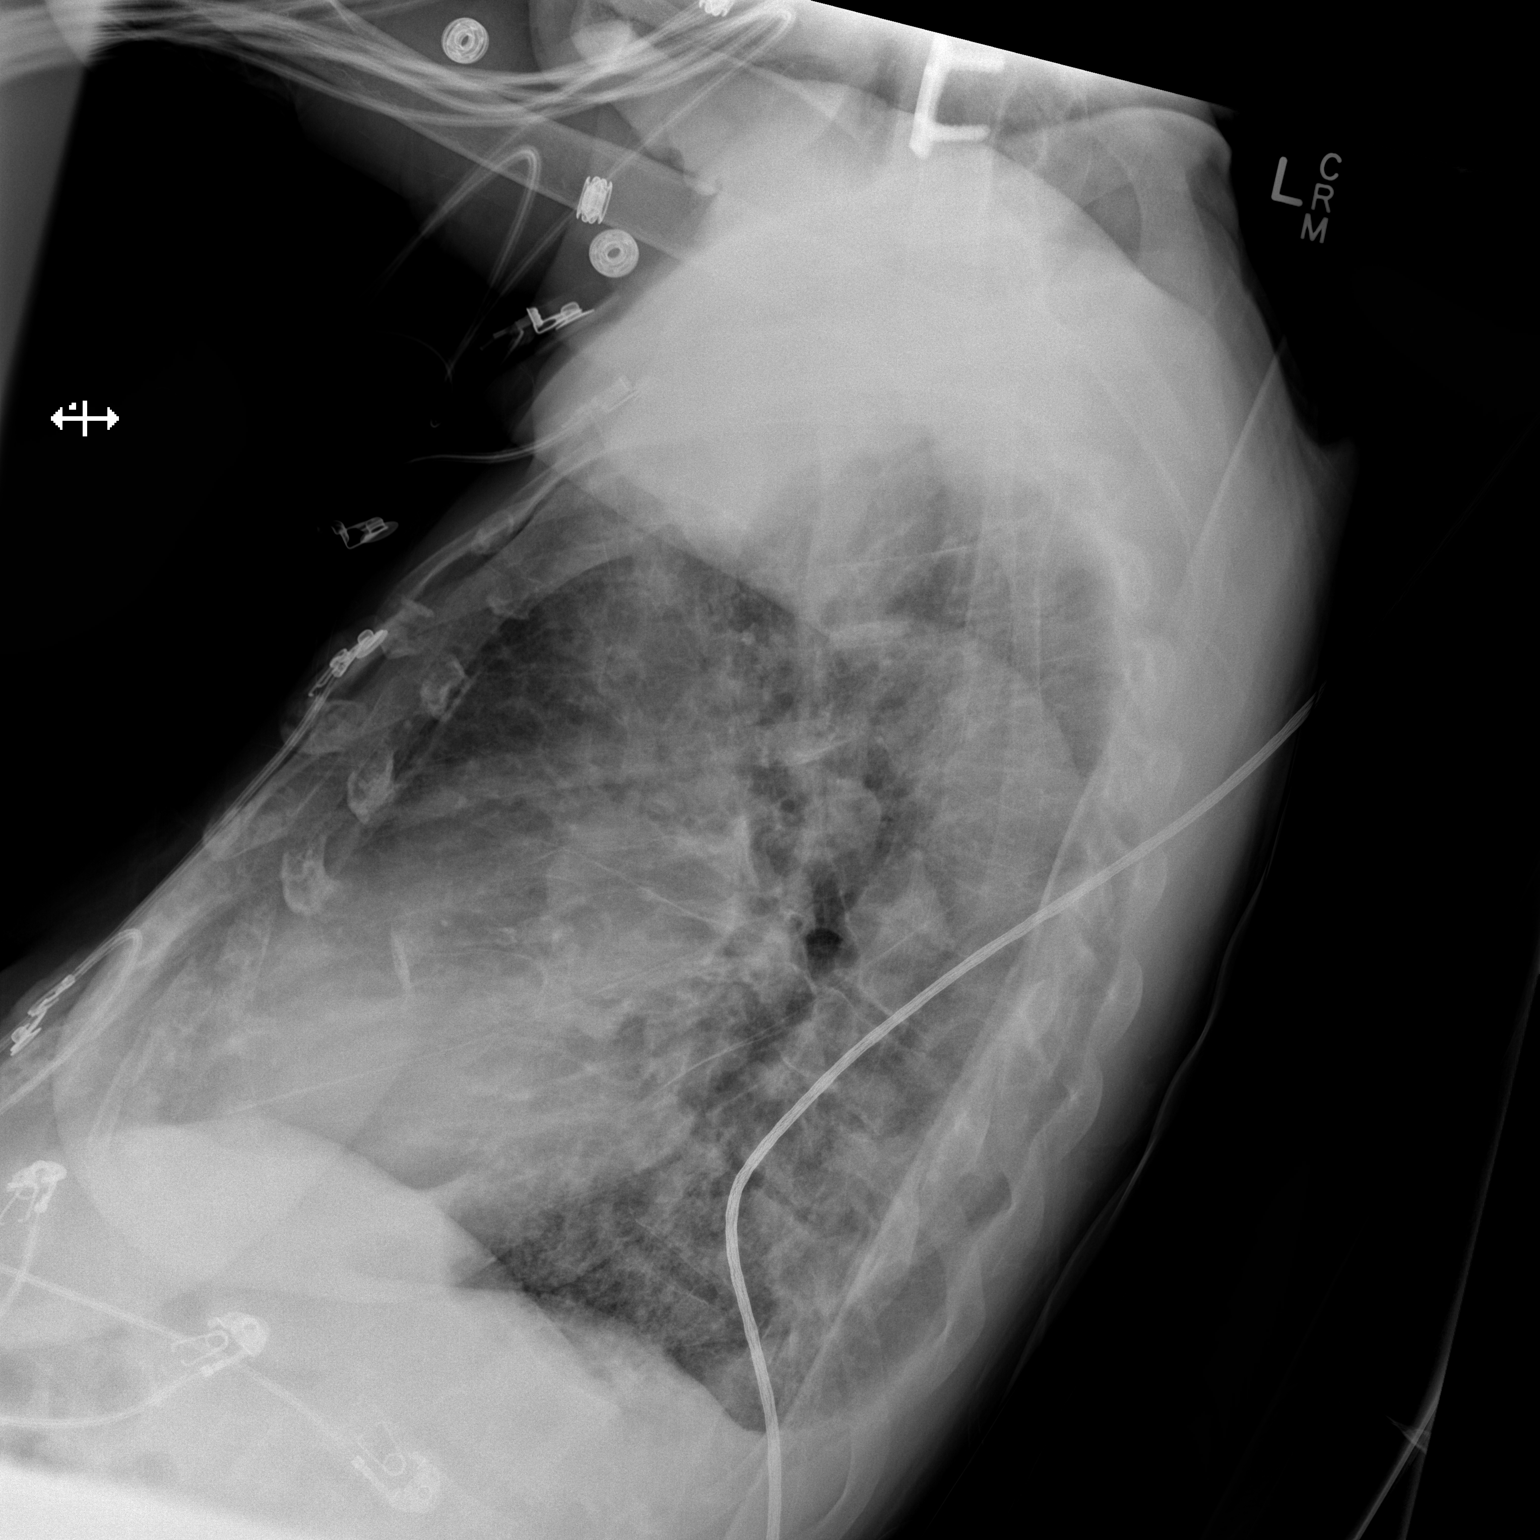

[2 of 2 positions shown; findings below may reference images not displayed]

FINDINGS: Cardiomegaly. Mild vascular congestion. Calcified tortuous aorta. No
consolidation or frank edema. Slight worsening aeration from priors.
IMPRESSION: Cardiomegaly with mild vascular congestion. Early edema is not
excluded.

## 2019-07-24 DIAGNOSIS — N2581 Secondary hyperparathyroidism of renal origin: Secondary | ICD-10-CM | POA: Diagnosis not present

## 2019-07-24 DIAGNOSIS — Z992 Dependence on renal dialysis: Secondary | ICD-10-CM | POA: Diagnosis not present

## 2019-07-24 DIAGNOSIS — D631 Anemia in chronic kidney disease: Secondary | ICD-10-CM | POA: Diagnosis not present

## 2019-07-24 DIAGNOSIS — N186 End stage renal disease: Secondary | ICD-10-CM | POA: Diagnosis not present

## 2019-07-26 DIAGNOSIS — N186 End stage renal disease: Secondary | ICD-10-CM | POA: Diagnosis not present

## 2019-07-26 DIAGNOSIS — Z992 Dependence on renal dialysis: Secondary | ICD-10-CM | POA: Diagnosis not present

## 2019-07-26 DIAGNOSIS — N2581 Secondary hyperparathyroidism of renal origin: Secondary | ICD-10-CM | POA: Diagnosis not present

## 2019-07-26 DIAGNOSIS — D631 Anemia in chronic kidney disease: Secondary | ICD-10-CM | POA: Diagnosis not present

## 2019-07-27 DIAGNOSIS — L299 Pruritus, unspecified: Secondary | ICD-10-CM | POA: Diagnosis not present

## 2019-07-27 DIAGNOSIS — Z87891 Personal history of nicotine dependence: Secondary | ICD-10-CM | POA: Diagnosis not present

## 2019-07-27 DIAGNOSIS — N186 End stage renal disease: Secondary | ICD-10-CM | POA: Diagnosis not present

## 2019-07-27 DIAGNOSIS — Z992 Dependence on renal dialysis: Secondary | ICD-10-CM | POA: Diagnosis not present

## 2019-07-27 DIAGNOSIS — D649 Anemia, unspecified: Secondary | ICD-10-CM | POA: Diagnosis not present

## 2019-07-27 DIAGNOSIS — H938X3 Other specified disorders of ear, bilateral: Secondary | ICD-10-CM | POA: Diagnosis not present

## 2019-07-27 DIAGNOSIS — H6123 Impacted cerumen, bilateral: Secondary | ICD-10-CM | POA: Diagnosis not present

## 2019-07-28 DIAGNOSIS — Z992 Dependence on renal dialysis: Secondary | ICD-10-CM | POA: Diagnosis not present

## 2019-07-28 DIAGNOSIS — N2581 Secondary hyperparathyroidism of renal origin: Secondary | ICD-10-CM | POA: Diagnosis not present

## 2019-07-28 DIAGNOSIS — D631 Anemia in chronic kidney disease: Secondary | ICD-10-CM | POA: Diagnosis not present

## 2019-07-28 DIAGNOSIS — N186 End stage renal disease: Secondary | ICD-10-CM | POA: Diagnosis not present

## 2019-07-31 DIAGNOSIS — N186 End stage renal disease: Secondary | ICD-10-CM | POA: Diagnosis not present

## 2019-07-31 DIAGNOSIS — D631 Anemia in chronic kidney disease: Secondary | ICD-10-CM | POA: Diagnosis not present

## 2019-07-31 DIAGNOSIS — N2581 Secondary hyperparathyroidism of renal origin: Secondary | ICD-10-CM | POA: Diagnosis not present

## 2019-07-31 DIAGNOSIS — Z992 Dependence on renal dialysis: Secondary | ICD-10-CM | POA: Diagnosis not present

## 2019-08-02 DIAGNOSIS — N186 End stage renal disease: Secondary | ICD-10-CM | POA: Diagnosis not present

## 2019-08-02 DIAGNOSIS — Z992 Dependence on renal dialysis: Secondary | ICD-10-CM | POA: Diagnosis not present

## 2019-08-02 DIAGNOSIS — D631 Anemia in chronic kidney disease: Secondary | ICD-10-CM | POA: Diagnosis not present

## 2019-08-02 DIAGNOSIS — N2581 Secondary hyperparathyroidism of renal origin: Secondary | ICD-10-CM | POA: Diagnosis not present

## 2019-08-04 DIAGNOSIS — D631 Anemia in chronic kidney disease: Secondary | ICD-10-CM | POA: Diagnosis not present

## 2019-08-04 DIAGNOSIS — Z992 Dependence on renal dialysis: Secondary | ICD-10-CM | POA: Diagnosis not present

## 2019-08-04 DIAGNOSIS — N2581 Secondary hyperparathyroidism of renal origin: Secondary | ICD-10-CM | POA: Diagnosis not present

## 2019-08-04 DIAGNOSIS — N186 End stage renal disease: Secondary | ICD-10-CM | POA: Diagnosis not present

## 2019-08-07 DIAGNOSIS — D631 Anemia in chronic kidney disease: Secondary | ICD-10-CM | POA: Diagnosis not present

## 2019-08-07 DIAGNOSIS — N186 End stage renal disease: Secondary | ICD-10-CM | POA: Diagnosis not present

## 2019-08-07 DIAGNOSIS — N2581 Secondary hyperparathyroidism of renal origin: Secondary | ICD-10-CM | POA: Diagnosis not present

## 2019-08-07 DIAGNOSIS — Z992 Dependence on renal dialysis: Secondary | ICD-10-CM | POA: Diagnosis not present

## 2019-08-09 DIAGNOSIS — N186 End stage renal disease: Secondary | ICD-10-CM | POA: Diagnosis not present

## 2019-08-09 DIAGNOSIS — D631 Anemia in chronic kidney disease: Secondary | ICD-10-CM | POA: Diagnosis not present

## 2019-08-09 DIAGNOSIS — N2581 Secondary hyperparathyroidism of renal origin: Secondary | ICD-10-CM | POA: Diagnosis not present

## 2019-08-09 DIAGNOSIS — Z992 Dependence on renal dialysis: Secondary | ICD-10-CM | POA: Diagnosis not present

## 2019-08-11 DIAGNOSIS — D631 Anemia in chronic kidney disease: Secondary | ICD-10-CM | POA: Diagnosis not present

## 2019-08-11 DIAGNOSIS — N2581 Secondary hyperparathyroidism of renal origin: Secondary | ICD-10-CM | POA: Diagnosis not present

## 2019-08-11 DIAGNOSIS — N186 End stage renal disease: Secondary | ICD-10-CM | POA: Diagnosis not present

## 2019-08-11 DIAGNOSIS — Z992 Dependence on renal dialysis: Secondary | ICD-10-CM | POA: Diagnosis not present

## 2019-08-14 DIAGNOSIS — D631 Anemia in chronic kidney disease: Secondary | ICD-10-CM | POA: Diagnosis not present

## 2019-08-14 DIAGNOSIS — Z992 Dependence on renal dialysis: Secondary | ICD-10-CM | POA: Diagnosis not present

## 2019-08-14 DIAGNOSIS — N186 End stage renal disease: Secondary | ICD-10-CM | POA: Diagnosis not present

## 2019-08-14 DIAGNOSIS — N2581 Secondary hyperparathyroidism of renal origin: Secondary | ICD-10-CM | POA: Diagnosis not present

## 2019-08-15 DIAGNOSIS — N186 End stage renal disease: Secondary | ICD-10-CM | POA: Diagnosis not present

## 2019-08-15 DIAGNOSIS — T8612 Kidney transplant failure: Secondary | ICD-10-CM | POA: Diagnosis not present

## 2019-08-15 DIAGNOSIS — Z992 Dependence on renal dialysis: Secondary | ICD-10-CM | POA: Diagnosis not present

## 2019-08-16 DIAGNOSIS — Z23 Encounter for immunization: Secondary | ICD-10-CM | POA: Diagnosis not present

## 2019-08-16 DIAGNOSIS — D631 Anemia in chronic kidney disease: Secondary | ICD-10-CM | POA: Diagnosis not present

## 2019-08-16 DIAGNOSIS — N2581 Secondary hyperparathyroidism of renal origin: Secondary | ICD-10-CM | POA: Diagnosis not present

## 2019-08-16 DIAGNOSIS — Z992 Dependence on renal dialysis: Secondary | ICD-10-CM | POA: Diagnosis not present

## 2019-08-16 DIAGNOSIS — N186 End stage renal disease: Secondary | ICD-10-CM | POA: Diagnosis not present

## 2019-08-18 DIAGNOSIS — Z992 Dependence on renal dialysis: Secondary | ICD-10-CM | POA: Diagnosis not present

## 2019-08-18 DIAGNOSIS — N186 End stage renal disease: Secondary | ICD-10-CM | POA: Diagnosis not present

## 2019-08-18 DIAGNOSIS — N2581 Secondary hyperparathyroidism of renal origin: Secondary | ICD-10-CM | POA: Diagnosis not present

## 2019-08-18 DIAGNOSIS — Z23 Encounter for immunization: Secondary | ICD-10-CM | POA: Diagnosis not present

## 2019-08-18 DIAGNOSIS — D631 Anemia in chronic kidney disease: Secondary | ICD-10-CM | POA: Diagnosis not present

## 2019-08-21 DIAGNOSIS — Z23 Encounter for immunization: Secondary | ICD-10-CM | POA: Diagnosis not present

## 2019-08-21 DIAGNOSIS — N186 End stage renal disease: Secondary | ICD-10-CM | POA: Diagnosis not present

## 2019-08-21 DIAGNOSIS — Z992 Dependence on renal dialysis: Secondary | ICD-10-CM | POA: Diagnosis not present

## 2019-08-21 DIAGNOSIS — N2581 Secondary hyperparathyroidism of renal origin: Secondary | ICD-10-CM | POA: Diagnosis not present

## 2019-08-21 DIAGNOSIS — D631 Anemia in chronic kidney disease: Secondary | ICD-10-CM | POA: Diagnosis not present

## 2019-08-23 DIAGNOSIS — D631 Anemia in chronic kidney disease: Secondary | ICD-10-CM | POA: Diagnosis not present

## 2019-08-23 DIAGNOSIS — Z23 Encounter for immunization: Secondary | ICD-10-CM | POA: Diagnosis not present

## 2019-08-23 DIAGNOSIS — N186 End stage renal disease: Secondary | ICD-10-CM | POA: Diagnosis not present

## 2019-08-23 DIAGNOSIS — N2581 Secondary hyperparathyroidism of renal origin: Secondary | ICD-10-CM | POA: Diagnosis not present

## 2019-08-23 DIAGNOSIS — Z992 Dependence on renal dialysis: Secondary | ICD-10-CM | POA: Diagnosis not present

## 2019-08-25 DIAGNOSIS — N186 End stage renal disease: Secondary | ICD-10-CM | POA: Diagnosis not present

## 2019-08-25 DIAGNOSIS — Z23 Encounter for immunization: Secondary | ICD-10-CM | POA: Diagnosis not present

## 2019-08-25 DIAGNOSIS — Z992 Dependence on renal dialysis: Secondary | ICD-10-CM | POA: Diagnosis not present

## 2019-08-25 DIAGNOSIS — N2581 Secondary hyperparathyroidism of renal origin: Secondary | ICD-10-CM | POA: Diagnosis not present

## 2019-08-25 DIAGNOSIS — D631 Anemia in chronic kidney disease: Secondary | ICD-10-CM | POA: Diagnosis not present

## 2019-08-28 DIAGNOSIS — Z23 Encounter for immunization: Secondary | ICD-10-CM | POA: Diagnosis not present

## 2019-08-28 DIAGNOSIS — N2581 Secondary hyperparathyroidism of renal origin: Secondary | ICD-10-CM | POA: Diagnosis not present

## 2019-08-28 DIAGNOSIS — N186 End stage renal disease: Secondary | ICD-10-CM | POA: Diagnosis not present

## 2019-08-28 DIAGNOSIS — Z992 Dependence on renal dialysis: Secondary | ICD-10-CM | POA: Diagnosis not present

## 2019-08-28 DIAGNOSIS — D631 Anemia in chronic kidney disease: Secondary | ICD-10-CM | POA: Diagnosis not present

## 2019-08-30 DIAGNOSIS — D631 Anemia in chronic kidney disease: Secondary | ICD-10-CM | POA: Diagnosis not present

## 2019-08-30 DIAGNOSIS — N186 End stage renal disease: Secondary | ICD-10-CM | POA: Diagnosis not present

## 2019-08-30 DIAGNOSIS — Z992 Dependence on renal dialysis: Secondary | ICD-10-CM | POA: Diagnosis not present

## 2019-08-30 DIAGNOSIS — N2581 Secondary hyperparathyroidism of renal origin: Secondary | ICD-10-CM | POA: Diagnosis not present

## 2019-08-30 DIAGNOSIS — Z23 Encounter for immunization: Secondary | ICD-10-CM | POA: Diagnosis not present

## 2019-09-01 DIAGNOSIS — N2581 Secondary hyperparathyroidism of renal origin: Secondary | ICD-10-CM | POA: Diagnosis not present

## 2019-09-01 DIAGNOSIS — Z23 Encounter for immunization: Secondary | ICD-10-CM | POA: Diagnosis not present

## 2019-09-01 DIAGNOSIS — D631 Anemia in chronic kidney disease: Secondary | ICD-10-CM | POA: Diagnosis not present

## 2019-09-01 DIAGNOSIS — N186 End stage renal disease: Secondary | ICD-10-CM | POA: Diagnosis not present

## 2019-09-01 DIAGNOSIS — Z992 Dependence on renal dialysis: Secondary | ICD-10-CM | POA: Diagnosis not present

## 2019-09-04 DIAGNOSIS — Z992 Dependence on renal dialysis: Secondary | ICD-10-CM | POA: Diagnosis not present

## 2019-09-04 DIAGNOSIS — Z23 Encounter for immunization: Secondary | ICD-10-CM | POA: Diagnosis not present

## 2019-09-04 DIAGNOSIS — D631 Anemia in chronic kidney disease: Secondary | ICD-10-CM | POA: Diagnosis not present

## 2019-09-04 DIAGNOSIS — N186 End stage renal disease: Secondary | ICD-10-CM | POA: Diagnosis not present

## 2019-09-04 DIAGNOSIS — N2581 Secondary hyperparathyroidism of renal origin: Secondary | ICD-10-CM | POA: Diagnosis not present

## 2019-09-06 DIAGNOSIS — D631 Anemia in chronic kidney disease: Secondary | ICD-10-CM | POA: Diagnosis not present

## 2019-09-06 DIAGNOSIS — Z23 Encounter for immunization: Secondary | ICD-10-CM | POA: Diagnosis not present

## 2019-09-06 DIAGNOSIS — Z992 Dependence on renal dialysis: Secondary | ICD-10-CM | POA: Diagnosis not present

## 2019-09-06 DIAGNOSIS — N186 End stage renal disease: Secondary | ICD-10-CM | POA: Diagnosis not present

## 2019-09-06 DIAGNOSIS — N2581 Secondary hyperparathyroidism of renal origin: Secondary | ICD-10-CM | POA: Diagnosis not present

## 2019-09-08 DIAGNOSIS — D631 Anemia in chronic kidney disease: Secondary | ICD-10-CM | POA: Diagnosis not present

## 2019-09-08 DIAGNOSIS — Z992 Dependence on renal dialysis: Secondary | ICD-10-CM | POA: Diagnosis not present

## 2019-09-08 DIAGNOSIS — Z23 Encounter for immunization: Secondary | ICD-10-CM | POA: Diagnosis not present

## 2019-09-08 DIAGNOSIS — N186 End stage renal disease: Secondary | ICD-10-CM | POA: Diagnosis not present

## 2019-09-08 DIAGNOSIS — N2581 Secondary hyperparathyroidism of renal origin: Secondary | ICD-10-CM | POA: Diagnosis not present

## 2019-09-11 DIAGNOSIS — N186 End stage renal disease: Secondary | ICD-10-CM | POA: Diagnosis not present

## 2019-09-11 DIAGNOSIS — N2581 Secondary hyperparathyroidism of renal origin: Secondary | ICD-10-CM | POA: Diagnosis not present

## 2019-09-11 DIAGNOSIS — D631 Anemia in chronic kidney disease: Secondary | ICD-10-CM | POA: Diagnosis not present

## 2019-09-11 DIAGNOSIS — Z23 Encounter for immunization: Secondary | ICD-10-CM | POA: Diagnosis not present

## 2019-09-11 DIAGNOSIS — Z992 Dependence on renal dialysis: Secondary | ICD-10-CM | POA: Diagnosis not present

## 2019-09-13 ENCOUNTER — Other Ambulatory Visit (INDEPENDENT_AMBULATORY_CARE_PROVIDER_SITE_OTHER): Payer: Self-pay | Admitting: Vascular Surgery

## 2019-09-13 DIAGNOSIS — Z992 Dependence on renal dialysis: Secondary | ICD-10-CM | POA: Diagnosis not present

## 2019-09-13 DIAGNOSIS — D631 Anemia in chronic kidney disease: Secondary | ICD-10-CM | POA: Diagnosis not present

## 2019-09-13 DIAGNOSIS — T829XXA Unspecified complication of cardiac and vascular prosthetic device, implant and graft, initial encounter: Secondary | ICD-10-CM

## 2019-09-13 DIAGNOSIS — Z23 Encounter for immunization: Secondary | ICD-10-CM | POA: Diagnosis not present

## 2019-09-13 DIAGNOSIS — N2581 Secondary hyperparathyroidism of renal origin: Secondary | ICD-10-CM | POA: Diagnosis not present

## 2019-09-13 DIAGNOSIS — R238 Other skin changes: Secondary | ICD-10-CM

## 2019-09-13 DIAGNOSIS — N186 End stage renal disease: Secondary | ICD-10-CM | POA: Diagnosis not present

## 2019-09-14 ENCOUNTER — Telehealth (INDEPENDENT_AMBULATORY_CARE_PROVIDER_SITE_OTHER): Payer: Self-pay

## 2019-09-14 ENCOUNTER — Ambulatory Visit (INDEPENDENT_AMBULATORY_CARE_PROVIDER_SITE_OTHER): Payer: Medicare Other | Admitting: Vascular Surgery

## 2019-09-14 ENCOUNTER — Other Ambulatory Visit: Payer: Self-pay

## 2019-09-14 ENCOUNTER — Encounter (INDEPENDENT_AMBULATORY_CARE_PROVIDER_SITE_OTHER): Payer: Self-pay | Admitting: Vascular Surgery

## 2019-09-14 ENCOUNTER — Ambulatory Visit (INDEPENDENT_AMBULATORY_CARE_PROVIDER_SITE_OTHER): Payer: Medicare Other

## 2019-09-14 VITALS — BP 137/68 | HR 82 | Resp 18 | Ht 62.0 in | Wt 133.0 lb

## 2019-09-14 DIAGNOSIS — I5032 Chronic diastolic (congestive) heart failure: Secondary | ICD-10-CM | POA: Insufficient documentation

## 2019-09-14 DIAGNOSIS — I728 Aneurysm of other specified arteries: Secondary | ICD-10-CM | POA: Diagnosis not present

## 2019-09-14 DIAGNOSIS — I10 Essential (primary) hypertension: Secondary | ICD-10-CM

## 2019-09-14 DIAGNOSIS — T829XXA Unspecified complication of cardiac and vascular prosthetic device, implant and graft, initial encounter: Secondary | ICD-10-CM | POA: Diagnosis not present

## 2019-09-14 DIAGNOSIS — T8612 Kidney transplant failure: Secondary | ICD-10-CM | POA: Diagnosis not present

## 2019-09-14 DIAGNOSIS — I4891 Unspecified atrial fibrillation: Secondary | ICD-10-CM

## 2019-09-14 DIAGNOSIS — N186 End stage renal disease: Secondary | ICD-10-CM

## 2019-09-14 DIAGNOSIS — T82898A Other specified complication of vascular prosthetic devices, implants and grafts, initial encounter: Secondary | ICD-10-CM | POA: Diagnosis not present

## 2019-09-14 DIAGNOSIS — Z992 Dependence on renal dialysis: Secondary | ICD-10-CM | POA: Diagnosis not present

## 2019-09-14 DIAGNOSIS — R238 Other skin changes: Secondary | ICD-10-CM | POA: Diagnosis not present

## 2019-09-14 DIAGNOSIS — I5033 Acute on chronic diastolic (congestive) heart failure: Secondary | ICD-10-CM | POA: Diagnosis not present

## 2019-09-14 NOTE — Telephone Encounter (Signed)
Patient was seen today and is now scheduled with Dr. Delana Meyer for angio on 09/19/2019 with a 11:00 am arrival time to the [facility]. Patient will do her Covid testing on 09/15/2019 between 12:30-2:30 pm at the [facility]. I spoke with the patient and gave her the day and time along with pre-procedure instructions. The pre-procedure instructions will be mailed to the patient.

## 2019-09-14 NOTE — Progress Notes (Signed)
MRN : [id number]  [name] is a 68 y.o. (1951/02/26) female who presents with chief complaint of problem with fistula.  History of Present Illness:   The patient returns to the office for follow up regarding problem with the dialysis access. Currently the patient is maintained via a left brachial cephalic fistula  The patient has had a failed left wrist accesses.  The fistula has become very aneurysmal and is tender.  It has bled on occasion.  She has not missed any HD runs.  She is also concerned about a large mass in the axilla which she feels is part of her fistula.  The patient has also been informed that there is increased recirculation.    The patient denies hand pain or other symptoms consistent with steal phenomena.  No significant arm swelling.  The patient denies redness or swelling at the access site. The patient denies fever or chills at home or while on dialysis.  The patient denies amaurosis fugax or recent TIA symptoms. There are no recent neurological changes noted. The patient denies claudication symptoms or rest pain symptoms. The patient denies history of DVT, PE or superficial thrombophlebitis. The patient denies recent episodes of angina or shortness of breath.    No outpatient medications have been marked as taking for the 09/14/19 encounter (Appointment) with Katha Cabal, MD.    Past Medical History:  Diagnosis Date   Adenomatous polyp of colon 10/2010, 2006, 2015   Anemia in CKD (chronic kidney disease) 11/07/2012   s/p blood transfusion.    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  Had deceased donor kidney transplant in 1996.  Had  some early rejection then stable function for years, then had slow decline of function and went back on hemodialysis in 2012.  Gets HD TTS schedule at [facility] still using L forearm AVF.      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; L side; MRI negative for acute CVA   Weight loss, unintentional     Past Surgical History:  Procedure Laterality Date   AV FISTULA PLACEMENT     for dialysis   AV FISTULA PLACEMENT Left 11/22/2015   Procedure: ARTERIOVENOUS (AV) FISTULA CREATION-LEFT BRACHIOCEPHALIC;  Surgeon: Serafina Mitchell, MD;  Location: [place];  Service: Vascular;  Laterality: Left;   BACK SURGERY     CERVICAL FUSION     CHOLECYSTECTOMY  12/02/2012   Procedure: LAPAROSCOPIC CHOLECYSTECTOMY WITH INTRAOPERATIVE CHOLANGIOGRAM;  Surgeon: Edward Jolly, MD;  Location: [place];  Service: General;  Laterality: N/A;  EYE SURGERY Bilateral    cataract surgery   EYE SURGERY Left 2019   laser   HEMATOMA EVACUATION Left 12/24/2016   Procedure: EVACUATION HEMATOMA LEFT UPPER ARM;  Surgeon: Waynetta Sandy, MD;  Location: [place];  Service: Vascular;  Laterality: Left;   I&D EXTREMITY Left 12/31/2016   Procedure: IRRIGATION AND DEBRIDEMENT EXTREMITY;  Surgeon: Angelia Mould, MD;  Location: [place];  Service: Vascular;  Laterality: Left;   INSERTION OF DIALYSIS CATHETER Right 12/24/2016   Procedure: INSERTION OF DIALYSIS CATHETER;  Surgeon: Waynetta Sandy, MD;  Location: [place];  Service: Vascular;  Laterality:  Right;   INSERTION OF DIALYSIS CATHETER Right 02/04/2017   Procedure: INSERTION OF DIALYSIS CATHETER;  Surgeon: Waynetta Sandy, MD;  Location: [place];  Service: Vascular;  Laterality: Right;   [place] Left 10/23/2016   Procedure: Fistulagram;  Surgeon: Elam Dutch, MD;  Location: [place] [facility];  Service: Cardiovascular;  Laterality: Left;   RESECTION OF ARTERIOVENOUS FISTULA ANEURYSM Left 11/22/2015   Procedure: RESECTION OF LEFT RADIOCEPHALIC FISTULA ANEURYSM ;  Surgeon: Serafina Mitchell, MD;  Location: [place] [facility];  Service: Vascular;  Laterality: Left;   REVISON OF ARTERIOVENOUS FISTULA Left 12/22/2016   Procedure: REVISON OF LEFT ARTERIOVENOUS FISTULA;  Surgeon: Waynetta Sandy, MD;  Location: [place];  Service: Vascular;  Laterality: Left;   REVISON OF ARTERIOVENOUS FISTULA Left 02/04/2017   Procedure: REVISON OF LEFT UPPER ARM ARTERIOVENOUS FISTULA;  Surgeon: Waynetta Sandy, MD;  Location: [place];  Service: Vascular;  Laterality: Left;    Social History Social History   Tobacco Use   Smoking status: Former Smoker    Types: Cigarettes    Quit date: 12/31/1991    Years since quitting: 27.7   Smokeless tobacco: Never Used  Substance Use Topics   Alcohol use: No    Alcohol/week: 0.0 standard drinks   Drug use: No    Family History Family History  Problem Relation Age of Onset   Colon cancer Brother    Cancer Brother    Coronary artery disease Mother 51   Hyperlipidemia Mother    Hypertension Mother    Stroke Maternal Aunt    Esophageal cancer Neg Hx    Stomach cancer Neg Hx    Rectal cancer Neg Hx   No family history of bleeding/clotting disorders, porphyria or autoimmune disease   Allergies  Allergen Reactions   Sulfa Antibiotics Other (See Comments)    Both parents allergic-so will not take   Adhesive [Tape] Itching     REVIEW OF SYSTEMS (Negative unless  checked)  Constitutional: [] Weight loss  [] Fever  [] Chills Cardiac: [] Chest pain   [] Chest pressure   [] Palpitations   [] Shortness of breath when laying flat   [] Shortness of breath with exertion. Vascular:  [] Pain in legs with walking   [] Pain in legs at rest  [] History of DVT   [] Phlebitis   [] Swelling in legs   [] Varicose veins   [] Non-healing ulcers Pulmonary:   [] Uses home oxygen   [] Productive cough   [] Hemoptysis   [] Wheeze  [] COPD   [] Asthma Neurologic:  [] Dizziness   [] Seizures   [] History of stroke   [] History of TIA  [] Aphasia   [] Vissual changes   [] Weakness or numbness in arm   [] Weakness or numbness in leg Musculoskeletal:   [] Joint swelling   [] Joint pain   [] Low back pain Hematologic:  [] Easy bruising  [] Easy bleeding   []   Hypercoagulable state   [] Anemic Gastrointestinal:  [] Diarrhea   [] Vomiting  [] Gastroesophageal reflux/heartburn   [] Difficulty swallowing. Genitourinary:  [x] Chronic kidney disease   [] Difficult urination  [] Frequent urination   [] Blood in urine Skin:  [] Rashes   [] Ulcers  Psychological:  [] History of anxiety   []  History of major depression.  Physical Examination  There were no vitals filed for this visit. There is no height or weight on file to calculate BMI. Gen: WD/WN, NAD Head: [place]/AT, No temporalis wasting.  Ear/Nose/Throat: Hearing grossly intact, nares w/o erythema or drainage, poor dentition Eyes: PER, EOMI, sclera nonicteric.  Neck: Supple, no masses.  No bruit or JVD.  Pulmonary:  Good air movement, clear to auscultation bilaterally, no use of accessory muscles.  Cardiac: RRR, normal S1, S2, no Murmurs. Vascular:  Pulsatile mass in the left axilla and the right antecubital fossa consistent with aneurysms, left brachial cephalic fistula is very large several areas of thinned damaged skin. Otherwise good thrill and bruit Vessel Right Left  Radial Palpable Palpable  Brachial Palpable Palpable  Carotid Palpable Palpable  Gastrointestinal: soft,  non-distended. No guarding/no peritoneal signs.  Musculoskeletal: M/S 5/5 throughout.  No deformity or atrophy.  Neurologic: CN 2-12 intact. Pain and light touch intact in extremities.  Symmetrical.  Speech is fluent. Motor exam as listed above. Psychiatric: Judgment intact, Mood & affect appropriate for pt's clinical situation. Dermatologic: No rashes or ulcers noted.  No changes consistent with cellulitis. Lymph : No Cervical lymphadenopathy, no lichenification or skin changes of chronic lymphedema.  CBC Lab Results  Component Value Date   WBC 6.6 12/14/2018   HGB 10.0 (L) 12/14/2018   HCT 31.2 (L) 12/14/2018   MCV 94.8 12/14/2018   PLT 183 12/14/2018    BMET    Component Value Date/Time   NA 135 12/14/2018 0532   NA 140 07/13/2018 0814   K 4.5 12/14/2018 0532   CL 93 (L) 12/14/2018 0532   CO2 28 12/14/2018 0532   GLUCOSE 105 (H) 12/14/2018 0532   BUN 17 12/14/2018 0532   BUN 45 (H) 07/13/2018 0814   CREATININE 4.48 (H) 12/14/2018 0532   CALCIUM 8.9 12/14/2018 0532   CALCIUM 9.6 08/03/2011 1005   GFRNONAA 9 (L) 12/14/2018 0532   GFRAA 11 (L) 12/14/2018 0532   CrCl cannot be calculated (Patient's most recent lab result is older than the maximum 21 days allowed.).  COAG Lab Results  Component Value Date   INR 1.10 02/23/2017   INR 1.13 11/19/2015   INR 1.06 11/16/2015    Radiology No results found.    Assessment/Plan 1. Problem with dialysis access, initial encounter [facility]) Recommend:  The patient is experiencing increasing problems with their dialysis access.  Patient should have a fistulagram with the intention for intervention.  The intention for intervention is to restore appropriate flow and prevent thrombosis and possible loss of the access.  As well as improve the quality of dialysis therapy.  The risks, benefits and alternative therapies were reviewed in detail with the patient.  All questions were answered.  The patient agrees to proceed with  angio/intervention.     A total of 60 minutes was spent with this patient and greater than 50% was spent in counseling and coordination of care with the patient.  Discussion included the treatment options for vascular disease including indications for surgery and intervention.  Also discussed is the appropriate timing of treatment.  In addition medical therapy was discussed.  2. 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   Given the severity of the patient's upper extremity symptoms the patient should undergo angiography and intervention.  Risk and benefits were reviewed the patient.  Indications for the procedure were reviewed.  All questions were answered, the patient agrees to proceed.   The patient should continue dialysis for now.  The patient should continue antiplatelet therapy and aggressive treatment of the lipid abnormalities  The patient will follow up with me after the angiogram.   3. ESRD (end stage renal disease) ([place]) Continue dialysis for now without interuption  Vein mapping of the right cephalic shows it is small <2 mm and not adequate for fistula.   4. Essential hypertension Continue antihypertensive medications as already ordered, these medications have been reviewed and there are no changes at this time.   5. Acute on chronic diastolic congestive heart failure (HCC) Continue cardiac and antihypertensive medications as already ordered and reviewed, no changes at this time.  Continue statin as ordered and reviewed, no changes at this time  Nitrates PRN for chest pain   6. Atrial fibrillation, unspecified type ([place]) Continue antiarrhythmia medications as already ordered, these medications have been reviewed and there are no changes at this time.  Continue anticoagulation as ordered by Cardiology Service     Hortencia Pilar, MD  09/14/2019 8:26 AM

## 2019-09-15 DIAGNOSIS — D509 Iron deficiency anemia, unspecified: Secondary | ICD-10-CM | POA: Diagnosis not present

## 2019-09-15 DIAGNOSIS — Z992 Dependence on renal dialysis: Secondary | ICD-10-CM | POA: Diagnosis not present

## 2019-09-15 DIAGNOSIS — N186 End stage renal disease: Secondary | ICD-10-CM | POA: Diagnosis not present

## 2019-09-15 DIAGNOSIS — D631 Anemia in chronic kidney disease: Secondary | ICD-10-CM | POA: Diagnosis not present

## 2019-09-15 DIAGNOSIS — N2581 Secondary hyperparathyroidism of renal origin: Secondary | ICD-10-CM | POA: Diagnosis not present

## 2019-09-18 ENCOUNTER — Other Ambulatory Visit (INDEPENDENT_AMBULATORY_CARE_PROVIDER_SITE_OTHER): Payer: Self-pay | Admitting: Nurse Practitioner

## 2019-09-18 DIAGNOSIS — N2581 Secondary hyperparathyroidism of renal origin: Secondary | ICD-10-CM | POA: Diagnosis not present

## 2019-09-18 DIAGNOSIS — Z992 Dependence on renal dialysis: Secondary | ICD-10-CM | POA: Diagnosis not present

## 2019-09-18 DIAGNOSIS — D631 Anemia in chronic kidney disease: Secondary | ICD-10-CM | POA: Diagnosis not present

## 2019-09-18 DIAGNOSIS — N186 End stage renal disease: Secondary | ICD-10-CM | POA: Diagnosis not present

## 2019-09-18 DIAGNOSIS — D509 Iron deficiency anemia, unspecified: Secondary | ICD-10-CM | POA: Diagnosis not present

## 2019-09-19 ENCOUNTER — Other Ambulatory Visit: Payer: Self-pay

## 2019-09-19 ENCOUNTER — Encounter
Admission: RE | Disposition: A | Discharge status: Home / Self Care | Payer: Self-pay | Source: Home / Self Care | Attending: Vascular Surgery

## 2019-09-19 ENCOUNTER — Encounter: Payer: Self-pay | Admitting: Emergency Medicine

## 2019-09-19 ENCOUNTER — Other Ambulatory Visit
Admission: RE | Admit: 2019-09-19 | Discharge: 2019-09-19 | Disposition: A | Discharge status: Home / Self Care | Payer: Medicare Other | Source: Ambulatory Visit | Attending: Vascular Surgery | Admitting: Vascular Surgery

## 2019-09-19 ENCOUNTER — Ambulatory Visit
Admission: RE | Admit: 2019-09-19 | Discharge: 2019-09-19 | Disposition: A | Discharge status: Home / Self Care | Payer: Medicare Other | Attending: Vascular Surgery | Admitting: Vascular Surgery

## 2019-09-19 DIAGNOSIS — Z87891 Personal history of nicotine dependence: Secondary | ICD-10-CM | POA: Diagnosis not present

## 2019-09-19 DIAGNOSIS — Z823 Family history of stroke: Secondary | ICD-10-CM | POA: Insufficient documentation

## 2019-09-19 DIAGNOSIS — Z882 Allergy status to sulfonamides status: Secondary | ICD-10-CM | POA: Insufficient documentation

## 2019-09-19 DIAGNOSIS — Z992 Dependence on renal dialysis: Secondary | ICD-10-CM | POA: Diagnosis not present

## 2019-09-19 DIAGNOSIS — I2510 Atherosclerotic heart disease of native coronary artery without angina pectoris: Secondary | ICD-10-CM | POA: Diagnosis not present

## 2019-09-19 DIAGNOSIS — I728 Aneurysm of other specified arteries: Secondary | ICD-10-CM | POA: Insufficient documentation

## 2019-09-19 DIAGNOSIS — K219 Gastro-esophageal reflux disease without esophagitis: Secondary | ICD-10-CM | POA: Diagnosis not present

## 2019-09-19 DIAGNOSIS — Z940 Kidney transplant status: Secondary | ICD-10-CM | POA: Diagnosis not present

## 2019-09-19 DIAGNOSIS — M1990 Unspecified osteoarthritis, unspecified site: Secondary | ICD-10-CM | POA: Insufficient documentation

## 2019-09-19 DIAGNOSIS — Z8673 Personal history of transient ischemic attack (TIA), and cerebral infarction without residual deficits: Secondary | ICD-10-CM | POA: Insufficient documentation

## 2019-09-19 DIAGNOSIS — E1122 Type 2 diabetes mellitus with diabetic chronic kidney disease: Secondary | ICD-10-CM | POA: Diagnosis not present

## 2019-09-19 DIAGNOSIS — Z8249 Family history of ischemic heart disease and other diseases of the circulatory system: Secondary | ICD-10-CM | POA: Diagnosis not present

## 2019-09-19 DIAGNOSIS — I132 Hypertensive heart and chronic kidney disease with heart failure and with stage 5 chronic kidney disease, or end stage renal disease: Secondary | ICD-10-CM | POA: Insufficient documentation

## 2019-09-19 DIAGNOSIS — I4891 Unspecified atrial fibrillation: Secondary | ICD-10-CM | POA: Insufficient documentation

## 2019-09-19 DIAGNOSIS — E785 Hyperlipidemia, unspecified: Secondary | ICD-10-CM | POA: Diagnosis not present

## 2019-09-19 DIAGNOSIS — Z20828 Contact with and (suspected) exposure to other viral communicable diseases: Secondary | ICD-10-CM | POA: Diagnosis not present

## 2019-09-19 DIAGNOSIS — T82898A Other specified complication of vascular prosthetic devices, implants and grafts, initial encounter: Secondary | ICD-10-CM | POA: Diagnosis not present

## 2019-09-19 DIAGNOSIS — E1140 Type 2 diabetes mellitus with diabetic neuropathy, unspecified: Secondary | ICD-10-CM | POA: Insufficient documentation

## 2019-09-19 DIAGNOSIS — N186 End stage renal disease: Secondary | ICD-10-CM

## 2019-09-19 HISTORY — PX: UPPER EXTREMITY ANGIOGRAPHY: CATH118270

## 2019-09-19 LAB — SARS CORONAVIRUS 2 BY RT PCR (HOSPITAL ORDER, PERFORMED IN ~~LOC~~ HOSPITAL LAB): SARS Coronavirus 2: NEGATIVE

## 2019-09-19 LAB — POTASSIUM (ARMC VASCULAR LAB ONLY): Potassium (ARMC vascular lab): 3.6 (ref 3.5–5.1)

## 2019-09-19 SURGERY — UPPER EXTREMITY ANGIOGRAPHY
Anesthesia: Moderate Sedation | Laterality: Bilateral

## 2019-09-19 MED ORDER — MIDAZOLAM HCL 2 MG/ML PO SYRP: 8.0000 mg | ORAL_SOLUTION | Freq: Once | ORAL | Status: DC | PRN

## 2019-09-19 MED ORDER — FENTANYL CITRATE (PF) 100 MCG/2ML IJ SOLN
INTRAMUSCULAR | Status: DC | PRN
  Administered 2019-09-19: 50 ug via INTRAVENOUS

## 2019-09-19 MED ORDER — MIDAZOLAM HCL 2 MG/2ML IJ SOLN
INTRAMUSCULAR | Status: DC | PRN
  Administered 2019-09-19: 2 mg via INTRAVENOUS

## 2019-09-19 MED ORDER — CEFAZOLIN SODIUM-DEXTROSE 1-4 GM/50ML-% IV SOLN
INTRAVENOUS | Status: AC
  Filled 2019-09-19: qty 50

## 2019-09-19 MED ORDER — FENTANYL CITRATE (PF) 100 MCG/2ML IJ SOLN
INTRAMUSCULAR | Status: AC
  Filled 2019-09-19: qty 2

## 2019-09-19 MED ORDER — DIPHENHYDRAMINE HCL 50 MG/ML IJ SOLN: 50.0000 mg | Freq: Once | INTRAMUSCULAR | Status: DC | PRN

## 2019-09-19 MED ORDER — FAMOTIDINE 20 MG PO TABS: 40.0000 mg | ORAL_TABLET | Freq: Once | ORAL | Status: DC | PRN

## 2019-09-19 MED ORDER — METHYLPREDNISOLONE SODIUM SUCC 125 MG IJ SOLR: 125.0000 mg | Freq: Once | INTRAMUSCULAR | Status: DC | PRN

## 2019-09-19 MED ORDER — MIDAZOLAM HCL 5 MG/5ML IJ SOLN
INTRAMUSCULAR | Status: AC
  Filled 2019-09-19: qty 5

## 2019-09-19 MED ORDER — ONDANSETRON HCL 4 MG/2ML IJ SOLN: 4.0000 mg | Freq: Four times a day (QID) | INTRAMUSCULAR | Status: DC | PRN

## 2019-09-19 MED ORDER — MORPHINE SULFATE (PF) 4 MG/ML IV SOLN: 2.0000 mg | INTRAVENOUS | Status: DC | PRN

## 2019-09-19 MED ORDER — HYDROMORPHONE HCL 1 MG/ML IJ SOLN: 1.0000 mg | Freq: Once | INTRAMUSCULAR | Status: DC | PRN

## 2019-09-19 MED ORDER — HEPARIN SODIUM (PORCINE) 1000 UNIT/ML IJ SOLN
INTRAMUSCULAR | Status: AC
  Filled 2019-09-19: qty 1

## 2019-09-19 MED ORDER — CEFAZOLIN SODIUM-DEXTROSE 1-4 GM/50ML-% IV SOLN
1.0000 g | Freq: Once | INTRAVENOUS | Status: AC
  Administered 2019-09-19: 1 g via INTRAVENOUS

## 2019-09-19 MED ORDER — SODIUM CHLORIDE 0.9 % IV SOLN
INTRAVENOUS | Status: DC
  Administered 2019-09-19: 13:00:00 via INTRAVENOUS

## 2019-09-19 MED ORDER — IODIXANOL 320 MG/ML IV SOLN
INTRAVENOUS | Status: DC | PRN
  Administered 2019-09-19: 40 mL via INTRA_ARTERIAL

## 2019-09-19 SURGICAL SUPPLY — 19 items
CATH ANGIO 5F 100CM .035 PIG (CATHETERS) ×1 IMPLANT
CATH BEACON 5 .035 100 H1 TIP (CATHETERS) ×1 IMPLANT
CATH BEACON 5 .035 100 SIM1 TP (CATHETERS) ×1 IMPLANT
CATH BEACON 5 .038 100 VERT TP (CATHETERS) ×1 IMPLANT
CATH G 5FX100 (CATHETERS) ×1 IMPLANT
CATH VISTA GUIDE 6FR JR4 (CATHETERS) ×1 IMPLANT
DEVICE STARCLOSE SE CLOSURE (Vascular Products) ×1 IMPLANT
DEVICE TORQUE .025-.038 (MISCELLANEOUS) ×1 IMPLANT
DRAPE BRACHIAL (DRAPES) ×2 IMPLANT
GLIDEWIRE ADV .035X260CM (WIRE) ×1 IMPLANT
GLIDEWIRE ANGLED SS 035X260CM (WIRE) ×1 IMPLANT
NDL ENTRY 21GA 7CM ECHOTIP (NEEDLE) IMPLANT
NEEDLE ENTRY 21GA 7CM ECHOTIP (NEEDLE) ×2 IMPLANT
PACK ANGIOGRAPHY (CUSTOM PROCEDURE TRAY) ×1 IMPLANT
SHEATH BRITE TIP 5FRX11 (SHEATH) ×1 IMPLANT
SHEATH BRITE TIP 6FRX11 (SHEATH) ×1 IMPLANT
SUT MNCRL AB 4-0 PS2 18 (SUTURE) ×1 IMPLANT
TOWEL OR 17X26 4PK STRL BLUE (TOWEL DISPOSABLE) ×2 IMPLANT
WIRE J 3MM .035X145CM (WIRE) ×1 IMPLANT

## 2019-09-19 NOTE — Progress Notes (Signed)
Spoke with patient regarding missing her appointment for COVID testing on 10/2 and patient states she was not aware of her appointment for testing.  She was instructed to come to [place] today at 11:00 for COVID testing pre procedure.  Vascular lab staff aware of delay and PAT notified of patient's arrival time

## 2019-09-19 NOTE — Op Note (Addendum)
[facility]  Percutaneous Study/Intervention Procedural Note   Date of Surgery: 09/19/2019,2:45 PM  Surgeon:, Dolores Lory   Pre-operative Diagnosis: Aneurysmal degeneration left brachiocephalic fistula; aneurysmal degeneration left axillary artery  Post-operative diagnosis:  Same  Procedure(s) Performed:  1.  Selective injection left upper extremity with second order catheter placement  2.  Selective injection of the innominate artery    Anesthesia: Conscious sedation was administered by the interventional radiology RN under my direct supervision. IV Versed plus fentanyl were utilized. Continuous ECG, pulse oximetry and blood pressure was monitored throughout the entire procedure.  Conscious sedation was administered for a total of 50 minutes.  Sheath: 6 French 11 cm Pinnacle sheath retrograde right common femoral  Contrast: 35 cc   Fluoroscopy Time: 19 minutes  Indications: The patient has experienced degeneration of her left arm fistula is now quite aneurysmal and has had several episodes of bleeding and there is concern regarding the size of the fistula and the potential for rupture.  She also has noted a pulsatile mass in her left axilla which has raised concerns for aneurysmal deterioration of the axillary artery itself.  Based on these concerns angiography has been recommended..   Procedure:  Marlo Arriola Hesteris a 68 y.o. female who was identified and appropriate procedural time out was performed.  The patient was then placed supine on the table and prepped and draped in the usual sterile fashion.  Ultrasound was used to evaluate the right common femoral artery.  It was echolucent and pulsatile indicating it is patent .  An ultrasound image was acquired for the permanent record.  A micropuncture needle was used to access the right common femoral artery under direct ultrasound guidance.  The microwire was then advanced under fluoroscopic guidance without  difficulty followed by the micro-sheath.  A 0.035 J wire was advanced without resistance and a 5Fr sheath was placed.    The J-wire and pigtail catheter then advanced up to the a sending aorta and an LAO projection of the aortic arch is obtained.  Pigtail catheter was exchanged for an H1 catheter over an advantage wire and the left subclavian artery is selected without difficulty.  H1 catheter is then advanced out to the level of the mid axillary artery.  Hand-injection contrast was then used to complete left upper extremity arteriogram.  H1 catheter is then pulled back into the arch and the innominate artery is cannulated and hand-injection of contrast is performed in the AP projection to demonstrate the origins of the right common carotid and right subclavian arteries.  However, multiple different catheters and wires were then used but I was unable to select the subclavian artery itself and attempts at hand-injection at the level of the innominate to demonstrate the more distal right upper extremity arterial system were not adequate.  The catheter and wire were then pulled back into the right external iliac artery and the catheter removed. An RAO view of the groin was obtained. StarClose device was deployed without difficulty.   Findings: The thoracic aorta and the aortic arch are opacified with contrast.  They are quite large the great vessels also are quite large measuring 20 mm and bigger.  It is a bovine arch.  It is extraordinarily tortuous as well.  There are no hemodynamically significant lesions.  The left upper extremity subclavian is tortuous but widely patent as is the axillary.  The brachial artery is quite large and widely patent.  There is minimal contrast seen distal to the  fistula anastomosis suggestive of steal syndrome.  The fistula itself is markedly aneurysmal.  There are no focal hemodynamically stent limiting stenoses.  However the fistula itself is quite tortuous.  Given the  unusual large size of all of the arteries with no focal dilatations this is a picture more consistent with arteriomegaly then aneurysmal disease.  However given the limitations of angiography for defining aneurysmal size a CT of the chest will be obtained.    Disposition: Patient was taken to the recovery room in stable condition having tolerated the procedure well.  Belenda Cruise  09/19/2019,2:45 PM

## 2019-09-19 NOTE — H&P (Signed)
[facility] History & Physical Update  The patient was interviewed and re-examined.  The patient's previous History and Physical has been reviewed and is unchanged.  There is no change in the plan of care. We plan to proceed with the scheduled procedure.  Hortencia Pilar, MD  09/19/2019, 12:13 PM

## 2019-09-19 NOTE — Progress Notes (Signed)
Spoke with Dr. Delana Meyer and [place], [place] regarding patient's elevated BP. Per Joelene Millin, Dr. Delana Meyer recognizes this as a chronic issue and it should not be acutely treated.

## 2019-09-19 NOTE — Discharge Instructions (Signed)
Femoral Site Care °This sheet gives you information about how to care for yourself after your procedure. Your health care provider may also give you more specific instructions. If you have problems or questions, contact your health care provider. °What can I expect after the procedure? °After the procedure, it is common to have: °· Bruising that usually fades within 1-2 weeks. °· Tenderness at the site. °Follow these instructions at home: °Wound care °· Follow instructions from your health care provider about how to take care of your insertion site. Make sure you: °? Wash your hands with soap and water before you change your bandage (dressing). If soap and water are not available, use hand sanitizer. °? Change your dressing as told by your health care provider. °? Leave stitches (sutures), skin glue, or adhesive strips in place. These skin closures may need to stay in place for 2 weeks or longer. If adhesive strip edges start to loosen and curl up, you may trim the loose edges. Do not remove adhesive strips completely unless your health care provider tells you to do that. °· Do not take baths, swim, or use a hot tub until your health care provider approves. °· You may shower 24-48 hours after the procedure or as told by your health care provider. °? Gently wash the site with plain soap and water. °? Pat the area dry with a clean towel. °? Do not rub the site. This may cause bleeding. °· Do not apply powder or lotion to the site. Keep the site clean and dry. °· Check your femoral site every day for signs of infection. Check for: °? Redness, swelling, or pain. °? Fluid or blood. °? Warmth. °? Pus or a bad smell. °Activity °· For the first 2-3 days after your procedure, or as long as directed: °? Avoid climbing stairs as much as possible. °? Do not squat. °· Do not lift anything that is heavier than 10 lb (4.5 kg), or the limit that you are told, until your health care provider says that it is safe. °· Rest as  directed. °? Avoid sitting for a long time without moving. Get up to take short walks every 1-2 hours. °· Do not drive for 24 hours if you were given a medicine to help you relax (sedative). °General instructions °· Take over-the-counter and prescription medicines only as told by your health care provider. °· Keep all follow-up visits as told by your health care provider. This is important. °Contact a health care provider if you have: °· A fever or chills. °· You have redness, swelling, or pain around your insertion site. °Get help right away if: °· The catheter insertion area swells very fast. °· You pass out. °· You suddenly start to sweat or your skin gets clammy. °· The catheter insertion area is bleeding, and the bleeding does not stop when you hold steady pressure on the area. °· The area near or just beyond the catheter insertion site becomes pale, cool, tingly, or numb. °These symptoms may represent a serious problem that is an emergency. Do not wait to see if the symptoms will go away. Get medical help right away. Call your local emergency services (911 in the [place] Do not drive yourself to the hospital. °Summary °· After the procedure, it is common to have bruising that usually fades within 1-2 weeks. °· Check your femoral site every day for signs of infection. °· Do not lift anything that is heavier than 10 lb (4.5 kg), or the   limit that you are told, until your health care provider says that it is safe. This information is not intended to replace advice given to you by your health care provider. Make sure you discuss any questions you have with your health care provider. Document Released: 08/03/2014 Document Revised: 12/13/2017 Document Reviewed: 12/13/2017 Elsevier Patient Education  2020 [place] After This sheet gives you information about how to care for yourself after your procedure. Your doctor may also give you more specific instructions. If you have problems or  questions, contact your doctor. Follow these instructions at home: Insertion site care  Follow instructions from your doctor about how to take care of your long, thin tube (catheter) insertion area. Make sure you: ? Wash your hands with soap and water before you change your bandage (dressing). If you cannot use soap and water, use hand sanitizer. ? Change your bandage as told by your doctor. ? Leave stitches (sutures), skin glue, or skin tape (adhesive) strips in place. They may need to stay in place for 2 weeks or longer. If tape strips get loose and curl up, you may trim the loose edges. Do not remove tape strips completely unless your doctor says it is okay.  Do not take baths, swim, or use a hot tub until your doctor says it is okay.  You may shower 24-48 hours after the procedure or as told by your doctor. ? Gently wash the area with plain soap and water. ? Pat the area dry with a clean towel. ? Do not rub the area. This may cause bleeding.  Do not apply powder or lotion to the area. Keep the area clean and dry.  Check your insertion area every day for signs of infection. Check for: ? More redness, swelling, or pain. ? Fluid or blood. ? Warmth. ? Pus or a bad smell. Activity  Rest as told by your doctor, usually for 1-2 days.  Do not lift anything that is heavier than 10 lbs. (4.5 kg) or as told by your doctor.  Do not drive for 24 hours if you were given a medicine to help you relax (sedative).  Do not drive or use heavy machinery while taking prescription pain medicine. General instructions   Go back to your normal activities as told by your doctor, usually in about a week. Ask your doctor what activities are safe for you.  If the insertion area starts to bleed, lie flat and put pressure on the area. If the bleeding does not stop, get help right away. This is an emergency.  Drink enough fluid to keep your pee (urine) clear or pale yellow.  Take over-the-counter and  prescription medicines only as told by your doctor.  Keep all follow-up visits as told by your doctor. This is important. Contact a doctor if:  You have a fever.  You have chills.  You have more redness, swelling, or pain around your insertion area.  You have fluid or blood coming from your insertion area.  The insertion area feels warm to the touch.  You have pus or a bad smell coming from your insertion area.  You have more bruising around the insertion area.  Blood collects in the tissue around the insertion area (hematoma) that may be painful to the touch. Get help right away if:  You have a lot of pain in the insertion area.  The insertion area swells very fast.  The insertion area is bleeding, and the bleeding  does not stop after holding steady pressure on the area.  The area near or just beyond the insertion area becomes pale, cool, tingly, or numb. These symptoms may be an emergency. Do not wait to see if the symptoms will go away. Get medical help right away. Call your local emergency services (911 in the [place] Do not drive yourself to the hospital. Summary  After the procedure, it is common to have bruising and tenderness at the long, thin tube insertion area.  After the procedure, it is important to rest and drink plenty of fluids.  Do not take baths, swim, or use a hot tub until your doctor says it is okay to do so. You may shower 24-48 hours after the procedure or as told by your doctor.  If the insertion area starts to bleed, lie flat and put pressure on the area. If the bleeding does not stop, get help right away. This is an emergency. This information is not intended to replace advice given to you by your health care provider. Make sure you discuss any questions you have with your health care provider. Document Released: 02/26/2009 Document Revised: 11/12/2017 Document Reviewed: 11/24/2016 Elsevier Patient Education  [place].    Moderate  Conscious Sedation, Adult, Care After These instructions provide you with information about caring for yourself after your procedure. Your health care provider may also give you more specific instructions. Your treatment has been planned according to current medical practices, but problems sometimes occur. Call your health care provider if you have any problems or questions after your procedure. What can I expect after the procedure? After your procedure, it is common:  To feel sleepy for several hours.  To feel clumsy and have poor balance for several hours.  To have poor judgment for several hours.  To vomit if you eat too soon. Follow these instructions at home: For at least 24 hours after the procedure:   Do not: ? Participate in activities where you could fall or become injured. ? Drive. ? Use heavy machinery. ? Drink alcohol. ? Take sleeping pills or medicines that cause drowsiness. ? Make important decisions or sign legal documents. ? Take care of children on your own.  Rest. Eating and drinking  Follow the diet recommended by your health care provider.  If you vomit: ? Drink water, juice, or soup when you can drink without vomiting. ? Make sure you have little or no nausea before eating solid foods. General instructions  Have a responsible adult stay with you until you are awake and alert.  Take over-the-counter and prescription medicines only as told by your health care provider.  If you smoke, do not smoke without supervision.  Keep all follow-up visits as told by your health care provider. This is important. Contact a health care provider if:  You keep feeling nauseous or you keep vomiting.  You feel light-headed.  You develop a rash.  You have a fever. Get help right away if:  You have trouble breathing. This information is not intended to replace advice given to you by your health care provider. Make sure you discuss any questions you have with your  health care provider. Document Released: 09/20/2013 Document Revised: 11/12/2017 Document Reviewed: 03/21/2016 Elsevier Patient Education  2020 [employer].

## 2019-09-20 ENCOUNTER — Encounter: Payer: Self-pay | Admitting: Vascular Surgery

## 2019-09-20 DIAGNOSIS — N2581 Secondary hyperparathyroidism of renal origin: Secondary | ICD-10-CM | POA: Diagnosis not present

## 2019-09-20 DIAGNOSIS — N186 End stage renal disease: Secondary | ICD-10-CM | POA: Diagnosis not present

## 2019-09-20 DIAGNOSIS — D509 Iron deficiency anemia, unspecified: Secondary | ICD-10-CM | POA: Diagnosis not present

## 2019-09-20 DIAGNOSIS — Z992 Dependence on renal dialysis: Secondary | ICD-10-CM | POA: Diagnosis not present

## 2019-09-20 DIAGNOSIS — D631 Anemia in chronic kidney disease: Secondary | ICD-10-CM | POA: Diagnosis not present

## 2019-09-22 DIAGNOSIS — D631 Anemia in chronic kidney disease: Secondary | ICD-10-CM | POA: Diagnosis not present

## 2019-09-22 DIAGNOSIS — N2581 Secondary hyperparathyroidism of renal origin: Secondary | ICD-10-CM | POA: Diagnosis not present

## 2019-09-22 DIAGNOSIS — D509 Iron deficiency anemia, unspecified: Secondary | ICD-10-CM | POA: Diagnosis not present

## 2019-09-22 DIAGNOSIS — N186 End stage renal disease: Secondary | ICD-10-CM | POA: Diagnosis not present

## 2019-09-22 DIAGNOSIS — Z992 Dependence on renal dialysis: Secondary | ICD-10-CM | POA: Diagnosis not present

## 2019-09-25 DIAGNOSIS — D631 Anemia in chronic kidney disease: Secondary | ICD-10-CM | POA: Diagnosis not present

## 2019-09-25 DIAGNOSIS — D509 Iron deficiency anemia, unspecified: Secondary | ICD-10-CM | POA: Diagnosis not present

## 2019-09-25 DIAGNOSIS — N186 End stage renal disease: Secondary | ICD-10-CM | POA: Diagnosis not present

## 2019-09-25 DIAGNOSIS — N2581 Secondary hyperparathyroidism of renal origin: Secondary | ICD-10-CM | POA: Diagnosis not present

## 2019-09-25 DIAGNOSIS — Z992 Dependence on renal dialysis: Secondary | ICD-10-CM | POA: Diagnosis not present

## 2019-09-27 DIAGNOSIS — Z992 Dependence on renal dialysis: Secondary | ICD-10-CM | POA: Diagnosis not present

## 2019-09-27 DIAGNOSIS — N2581 Secondary hyperparathyroidism of renal origin: Secondary | ICD-10-CM | POA: Diagnosis not present

## 2019-09-27 DIAGNOSIS — D509 Iron deficiency anemia, unspecified: Secondary | ICD-10-CM | POA: Diagnosis not present

## 2019-09-27 DIAGNOSIS — N186 End stage renal disease: Secondary | ICD-10-CM | POA: Diagnosis not present

## 2019-09-27 DIAGNOSIS — D631 Anemia in chronic kidney disease: Secondary | ICD-10-CM | POA: Diagnosis not present

## 2019-09-29 DIAGNOSIS — D631 Anemia in chronic kidney disease: Secondary | ICD-10-CM | POA: Diagnosis not present

## 2019-09-29 DIAGNOSIS — N186 End stage renal disease: Secondary | ICD-10-CM | POA: Diagnosis not present

## 2019-09-29 DIAGNOSIS — Z992 Dependence on renal dialysis: Secondary | ICD-10-CM | POA: Diagnosis not present

## 2019-09-29 DIAGNOSIS — D509 Iron deficiency anemia, unspecified: Secondary | ICD-10-CM | POA: Diagnosis not present

## 2019-09-29 DIAGNOSIS — N2581 Secondary hyperparathyroidism of renal origin: Secondary | ICD-10-CM | POA: Diagnosis not present

## 2019-10-02 DIAGNOSIS — D509 Iron deficiency anemia, unspecified: Secondary | ICD-10-CM | POA: Diagnosis not present

## 2019-10-02 DIAGNOSIS — N186 End stage renal disease: Secondary | ICD-10-CM | POA: Diagnosis not present

## 2019-10-02 DIAGNOSIS — N2581 Secondary hyperparathyroidism of renal origin: Secondary | ICD-10-CM | POA: Diagnosis not present

## 2019-10-02 DIAGNOSIS — Z992 Dependence on renal dialysis: Secondary | ICD-10-CM | POA: Diagnosis not present

## 2019-10-02 DIAGNOSIS — D631 Anemia in chronic kidney disease: Secondary | ICD-10-CM | POA: Diagnosis not present

## 2019-10-04 DIAGNOSIS — D631 Anemia in chronic kidney disease: Secondary | ICD-10-CM | POA: Diagnosis not present

## 2019-10-04 DIAGNOSIS — N186 End stage renal disease: Secondary | ICD-10-CM | POA: Diagnosis not present

## 2019-10-04 DIAGNOSIS — D509 Iron deficiency anemia, unspecified: Secondary | ICD-10-CM | POA: Diagnosis not present

## 2019-10-04 DIAGNOSIS — N2581 Secondary hyperparathyroidism of renal origin: Secondary | ICD-10-CM | POA: Diagnosis not present

## 2019-10-04 DIAGNOSIS — Z992 Dependence on renal dialysis: Secondary | ICD-10-CM | POA: Diagnosis not present

## 2019-10-04 NOTE — Progress Notes (Signed)
MRN : [id number]  [name] is a 67 y.o. ([birth year]) female who presents with chief complaint of No chief complaint on file. Marland Kitchen  History of Present Illness:   The patient returns to the office for followup of their dialysis access. The function of the access has been stable. The patient denies increased bleeding time or increased recirculation. Patient denies difficulty with cannulation. The patient denies hand pain or other symptoms consistent with steal phenomena.  No significant arm swelling.  Today she is stating she feels incredibly tired.  She notes that her hemoglobin has steadily drifted from its baseline of 11 down to 9.  This is happened in the past and she is required blood transfusion.  Overall she "just feels worn out".  The patient denies redness or swelling at the access site. The patient denies fever or chills at home or while on dialysis.  The patient denies amaurosis fugax or recent TIA symptoms. There are no recent neurological changes noted. The patient denies claudication symptoms or rest pain symptoms. The patient denies history of DVT, PE or superficial thrombophlebitis. The patient denies recent episodes of angina or shortness of breath.     No outpatient medications have been marked as taking for the 10/05/19 encounter (Appointment) with Delana Meyer, Dolores Lory, MD.    Past Medical History:  Diagnosis Date  . Adenomatous polyp of colon 10/2010, 2006, 2015  . Anemia in CKD (chronic kidney disease) 11/07/2012   s/p blood transfusion.   . Arthritis   . CAD (coronary artery disease)    "something like that"  . CHF (congestive heart failure) ([place])   . Constipation   . Depression with anxiety   . Dialysis patient ([place])    Mon-Wed-Fri  . Diverticula, colon   . 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  Had deceased donor kidney transplant in 1996.  Had some early rejection  then stable function for years, then had slow decline of function and went back on hemodialysis in 2012.  Gets HD TTS schedule [facility] still using L forearm AVF.     Marland Kitchen GERD (gastroesophageal reflux disease)   . GI bleed 2017   felt to be ischemic colitis, last colo 2015  . Headache   . Hypercholesterolemia   . Hyperlipidemia   . Hypertension   . Neurologic gait dysfunction   . Neuromuscular disorder (HCC)    neuropathy hand and legs  . Osteoporosis   . Pneumonia   . 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 Stroke [facility]) 11/2015   TIA  . Stroke-like symptoms 11/2015  . Vertigo   . 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 Weight loss, unintentional     Past Surgical History:  Procedure Laterality Date  . AV FISTULA PLACEMENT     for dialysis  . AV FISTULA PLACEMENT Left 11/22/2015   Procedure: ARTERIOVENOUS (AV) FISTULA CREATION-LEFT BRACHIOCEPHALIC;  Surgeon: Serafina Mitchell, MD;  Location: [place];  Service: Vascular;  Laterality: Left;  . BACK SURGERY    . CERVICAL FUSION    . CHOLECYSTECTOMY  12/02/2012   Procedure: LAPAROSCOPIC CHOLECYSTECTOMY WITH INTRAOPERATIVE CHOLANGIOGRAM;  Surgeon: Edward Jolly, MD;  Location: [place];  Service: General;  Laterality: N/A;  . EYE SURGERY Bilateral    cataract surgery  . EYE  SURGERY Left 2019   laser  . HEMATOMA EVACUATION Left 12/24/2016   Procedure: EVACUATION HEMATOMA LEFT UPPER ARM;  Surgeon: Waynetta Sandy, MD;  Location: [place];  Service: Vascular;  Laterality: Left;  . I&D EXTREMITY Left 12/31/2016   Procedure: IRRIGATION AND DEBRIDEMENT EXTREMITY;  Surgeon: Angelia Mould, MD;  Location: [place];  Service: Vascular;  Laterality: Left;  . INSERTION OF DIALYSIS CATHETER Right 12/24/2016   Procedure: INSERTION OF DIALYSIS CATHETER;  Surgeon: Waynetta Sandy, MD;  Location: [place];  Service: Vascular;  Laterality: Right;  . INSERTION OF  DIALYSIS CATHETER Right 02/04/2017   Procedure: INSERTION OF DIALYSIS CATHETER;  Surgeon: Waynetta Sandy, MD;  Location: [place];  Service: Vascular;  Laterality: Right;  . KIDNEY TRANSPLANT  1996  . PERIPHERAL VASCULAR CATHETERIZATION Left 10/23/2016   Procedure: Fistulagram;  Surgeon: Elam Dutch, MD;  Location: [place] CV LAB;  Service: Cardiovascular;  Laterality: Left;  . RESECTION OF ARTERIOVENOUS FISTULA ANEURYSM Left 11/22/2015   Procedure: RESECTION OF LEFT RADIOCEPHALIC FISTULA ANEURYSM ;  Surgeon: Serafina Mitchell, MD;  Location: [place];  Service: Vascular;  Laterality: Left;  . REVISON OF ARTERIOVENOUS FISTULA Left 12/22/2016   Procedure: REVISON OF LEFT ARTERIOVENOUS FISTULA;  Surgeon: Waynetta Sandy, MD;  Location: [place];  Service: Vascular;  Laterality: Left;  . REVISON OF ARTERIOVENOUS FISTULA Left 02/04/2017   Procedure: REVISON OF LEFT UPPER ARM ARTERIOVENOUS FISTULA;  Surgeon: Waynetta Sandy, MD;  Location: [place];  Service: Vascular;  Laterality: Left;  . UPPER EXTREMITY ANGIOGRAPHY Bilateral 09/19/2019   Procedure: UPPER EXTREMITY ANGIOGRAPHY;  Surgeon: Katha Cabal, MD;  Location: [place] CV LAB;  Service: Cardiovascular;  Laterality: Bilateral;    Social History Social History   Tobacco Use  . Smoking status: Former Smoker    Types: Cigarettes    Quit date: 12/31/1991    Years since quitting: 27.7  . Smokeless tobacco: Never Used  Substance Use Topics  . Alcohol use: No    Alcohol/week: 0.0 standard drinks  . Drug use: No    Family History Family History  Problem Relation Age of Onset  . Colon cancer Brother   . Cancer Brother   . Coronary artery disease Mother 11  . Hyperlipidemia Mother   . Hypertension Mother   . Stroke Maternal Aunt   . Esophageal cancer Neg Hx   . Stomach cancer Neg Hx   . Rectal cancer Neg Hx     Allergies  Allergen Reactions  . Sulfa Antibiotics Other (See Comments)    Both parents  allergic-so will not take  . Adhesive [Tape] Itching     REVIEW OF SYSTEMS (Negative unless checked)  Constitutional: [] Weight loss  [] Fever  [] Chills Cardiac: [] Chest pain   [] Chest pressure   [] Palpitations   [] Shortness of breath when laying flat   [] Shortness of breath with exertion. Vascular:  [] Pain in legs with walking   [] Pain in legs at rest  [] History of DVT   [] Phlebitis   [] Swelling in legs   [] Varicose veins   [] Non-healing ulcers Pulmonary:   [] Uses home oxygen   [] Productive cough   [] Hemoptysis   [] Wheeze  [] COPD   [] Asthma Neurologic:  [] Dizziness   [] Seizures   [] History of stroke   [] History of TIA  [] Aphasia   [] Vissual changes   [] Weakness or numbness in arm   [] Weakness or numbness in leg Musculoskeletal:   [] Joint swelling   [] Joint pain   [] Low  back pain Hematologic:  [] Easy bruising  [] Easy bleeding   [] Hypercoagulable state   [x] Anemic Gastrointestinal:  [] Diarrhea   [] Vomiting  [] Gastroesophageal reflux/heartburn   [] Difficulty swallowing. Genitourinary:  [x] Chronic kidney disease   [] Difficult urination  [] Frequent urination   [] Blood in urine Skin:  [] Rashes   [] Ulcers  Psychological:  [] History of anxiety   []  History of major depression.  Physical Examination  There were no vitals filed for this visit. There is no height or weight on file to calculate BMI. Gen: WD/WN, NAD Head: [place]/AT, No temporalis wasting.  Ear/Nose/Throat: Hearing grossly intact, nares w/o erythema or drainage Eyes: PER, EOMI, sclera nonicteric.  Neck: Supple, no large masses.   Pulmonary:  Good air movement, no audible wheezing bilaterally, no use of accessory muscles.  Cardiac: RRR, no JVD Vascular:  Aneurysmal left fistula good thrill good bruit Vessel Right Left  Radial Palpable Palpable  Brachial Palpable Palpable  Gastrointestinal: Non-distended. No guarding/no peritoneal signs.  Musculoskeletal: M/S 5/5 throughout.  No deformity or atrophy.  Neurologic: CN 2-12 intact.  Symmetrical.  Speech is fluent. Motor exam as listed above. Psychiatric: Judgment intact, Mood & affect appropriate for pt's clinical situation. Dermatologic: No rashes or ulcers noted.  No changes consistent with cellulitis. Lymph : No lichenification or skin changes of chronic lymphedema.  CBC Lab Results  Component Value Date   WBC 6.6 12/14/2018   HGB 10.0 (L) 12/14/2018   HCT 31.2 (L) 12/14/2018   MCV 94.8 12/14/2018   PLT 183 12/14/2018    BMET    Component Value Date/Time   NA 135 12/14/2018 0532   NA 140 07/13/2018 0814   K 4.5 12/14/2018 0532   CL 93 (L) 12/14/2018 0532   CO2 28 12/14/2018 0532   GLUCOSE 105 (H) 12/14/2018 0532   BUN 17 12/14/2018 0532   BUN 45 (H) 07/13/2018 0814   CREATININE 4.48 (H) 12/14/2018 0532   CALCIUM 8.9 12/14/2018 0532   CALCIUM 9.6 08/03/2011 1005   GFRNONAA 9 (L) 12/14/2018 0532   GFRAA 11 (L) 12/14/2018 0532   CrCl cannot be calculated (Patient's most recent lab result is older than the maximum 21 days allowed.).  COAG Lab Results  Component Value Date   INR 1.10 02/23/2017   INR 1.13 11/19/2015   INR 1.06 11/16/2015    Radiology Vas US Duplex Dialysis Access (avf, Avg)  Result Date: 09/14/2019 DIALYSIS ACCESS Reason for Exam: Routine follow up. Access Site: Left Upper Extremity. Access Type: Brachial-cephalic AVF. History: Left radceph converted to brachceph AVF 11/22/2015. 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          No complaints or issue with dialysis. Comparison Study: 03/02/2016 Performing Technologist: Concha Norway RVT  Examination Guidelines: A complete evaluation includes B-mode imaging, spectral Doppler, color Doppler, and power Doppler as needed of all accessible portions of each vessel. Unilateral testing is considered an integral part of a complete examination. Limited examinations for reoccurring indications may be performed as noted.  Findings:  +--------------------+----------+-----------------+--------+ AVF                 PSV (cm/s)Flow Vol (mL/min)Comments +--------------------+----------+-----------------+--------+ Native artery inflow   176          2042                +--------------------+----------+-----------------+--------+ AVF Anastomosis        128                              +--------------------+----------+-----------------+--------+  +---------------+----------+-------------+----------+--------+  OUTFLOW VEIN   PSV (cm/s)Diameter (cm)Depth (cm)Describe +---------------+----------+-------------+----------+--------+ Subclavian vein   147                                    +---------------+----------+-------------+----------+--------+ Axillary vein      51                                    +---------------+----------+-------------+----------+--------+ Confluence        309                                    +---------------+----------+-------------+----------+--------+ Clavicle          267                                    +---------------+----------+-------------+----------+--------+ Shoulder          107                                    +---------------+----------+-------------+----------+--------+ Prox UA           135        1.68                        +---------------+----------+-------------+----------+--------+ Mid UA            129        1.81                        +---------------+----------+-------------+----------+--------+ Dist UA           135        1.28                        +---------------+----------+-------------+----------+--------+  +----------------+-------------+----------+---------+----------+---------------+                 Diameter (cm)Depth (cm)BranchingPSV (cm/s)  Flow Volume                                                                (ml/min)     +----------------+-------------+----------+---------+----------+---------------+  Left dis rad art                                    4                     +----------------+-------------+----------+---------+----------+---------------+ antegrade low                                                             flow                                                                      +----------------+-------------+----------+---------+----------+---------------+  Left subclav art    1.23                                                  +----------------+-------------+----------+---------+----------+---------------+  Summary: Patent left brachiocephalic AVF with no evidence of stenosis. Mostly aneurysmal throughout the AVF and moderately tortuous. The subclavian artery also appears dilated in the shoulder/proximal region.  *See table(s) above for measurements and observations.  Diagnosing physician: Hortencia Pilar MD Electronically signed by Hortencia Pilar MD on 09/14/2019 at 4:55:50 PM.   --------------------------------------------------------------------------------   Final      Assessment/Plan 1. 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  We discussed the subclavian artery aneurysm.  We also discussed the aneurysmal changes of her fistula itself.  At the present time we will try to continue to use her fistula.  I discussed obtaining a CT scan of the chest but we will wait until she is feeling a little stronger and I will see her back in 3 months with a CAT scan CT angio of the chest  - CT Angio Chest W/Cm &/Or Wo Cm; Future  2. ESRD (end stage renal disease) ([place]) At the present time the patient has adequate dialysis access.  Continue hemodialysis as ordered without interruption.  Avoid nephrotoxic medications and dehydration.  Further plans per nephrology  3. Problem with dialysis access, initial encounter [facility]) Recommend:  The patient is doing well and currently has adequate dialysis access. The  patient's dialysis center is not reporting any access issues. Flow pattern is stable when compared to the prior ultrasound.  The patient should have a duplex ultrasound of the dialysis access in 3 months. The patient will follow-up with me in the office after each ultrasound   4. Essential hypertension Continue antihypertensive medications as already ordered, these medications have been reviewed and there are no changes at this time.   5. Atrial fibrillation, unspecified type ([place]) Continue antiarrhythmia medications as already ordered, these medications have been reviewed and there are no changes at this time.  Continue anticoagulation as ordered by Cardiology Service    Hortencia Pilar, MD  10/04/2019 8:27 PM

## 2019-10-05 ENCOUNTER — Ambulatory Visit (INDEPENDENT_AMBULATORY_CARE_PROVIDER_SITE_OTHER): Payer: Medicare Other | Admitting: Vascular Surgery

## 2019-10-05 ENCOUNTER — Encounter (INDEPENDENT_AMBULATORY_CARE_PROVIDER_SITE_OTHER): Payer: Self-pay | Admitting: Vascular Surgery

## 2019-10-05 ENCOUNTER — Other Ambulatory Visit: Payer: Self-pay

## 2019-10-05 VITALS — BP 172/84 | HR 70 | Resp 10 | Ht 65.0 in | Wt 132.0 lb

## 2019-10-05 DIAGNOSIS — N186 End stage renal disease: Secondary | ICD-10-CM | POA: Diagnosis not present

## 2019-10-05 DIAGNOSIS — T82898A Other specified complication of vascular prosthetic devices, implants and grafts, initial encounter: Secondary | ICD-10-CM

## 2019-10-05 DIAGNOSIS — I4891 Unspecified atrial fibrillation: Secondary | ICD-10-CM | POA: Diagnosis not present

## 2019-10-05 DIAGNOSIS — I10 Essential (primary) hypertension: Secondary | ICD-10-CM

## 2019-10-05 DIAGNOSIS — I728 Aneurysm of other specified arteries: Secondary | ICD-10-CM

## 2019-10-06 DIAGNOSIS — D509 Iron deficiency anemia, unspecified: Secondary | ICD-10-CM | POA: Diagnosis not present

## 2019-10-06 DIAGNOSIS — N186 End stage renal disease: Secondary | ICD-10-CM | POA: Diagnosis not present

## 2019-10-06 DIAGNOSIS — N2581 Secondary hyperparathyroidism of renal origin: Secondary | ICD-10-CM | POA: Diagnosis not present

## 2019-10-06 DIAGNOSIS — D631 Anemia in chronic kidney disease: Secondary | ICD-10-CM | POA: Diagnosis not present

## 2019-10-06 DIAGNOSIS — Z992 Dependence on renal dialysis: Secondary | ICD-10-CM | POA: Diagnosis not present

## 2019-10-09 DIAGNOSIS — N2581 Secondary hyperparathyroidism of renal origin: Secondary | ICD-10-CM | POA: Diagnosis not present

## 2019-10-09 DIAGNOSIS — D631 Anemia in chronic kidney disease: Secondary | ICD-10-CM | POA: Diagnosis not present

## 2019-10-09 DIAGNOSIS — Z992 Dependence on renal dialysis: Secondary | ICD-10-CM | POA: Diagnosis not present

## 2019-10-09 DIAGNOSIS — N186 End stage renal disease: Secondary | ICD-10-CM | POA: Diagnosis not present

## 2019-10-09 DIAGNOSIS — D509 Iron deficiency anemia, unspecified: Secondary | ICD-10-CM | POA: Diagnosis not present

## 2019-10-11 ENCOUNTER — Observation Stay (HOSPITAL_COMMUNITY)
Admission: EM | Admit: 2019-10-11 | Discharge: 2019-10-12 | Disposition: A | Discharge status: Home Health | Payer: Medicare Other | Attending: Family Medicine | Admitting: Family Medicine

## 2019-10-11 ENCOUNTER — Encounter (HOSPITAL_COMMUNITY): Payer: Self-pay

## 2019-10-11 ENCOUNTER — Other Ambulatory Visit: Payer: Self-pay

## 2019-10-11 ENCOUNTER — Emergency Department (HOSPITAL_COMMUNITY): Payer: Medicare Other

## 2019-10-11 DIAGNOSIS — Z8249 Family history of ischemic heart disease and other diseases of the circulatory system: Secondary | ICD-10-CM | POA: Diagnosis not present

## 2019-10-11 DIAGNOSIS — Z882 Allergy status to sulfonamides status: Secondary | ICD-10-CM | POA: Diagnosis not present

## 2019-10-11 DIAGNOSIS — I4891 Unspecified atrial fibrillation: Secondary | ICD-10-CM | POA: Diagnosis not present

## 2019-10-11 DIAGNOSIS — M810 Age-related osteoporosis without current pathological fracture: Secondary | ICD-10-CM | POA: Insufficient documentation

## 2019-10-11 DIAGNOSIS — Z8349 Family history of other endocrine, nutritional and metabolic diseases: Secondary | ICD-10-CM | POA: Insufficient documentation

## 2019-10-11 DIAGNOSIS — Z87891 Personal history of nicotine dependence: Secondary | ICD-10-CM | POA: Diagnosis not present

## 2019-10-11 DIAGNOSIS — M6281 Muscle weakness (generalized): Secondary | ICD-10-CM | POA: Diagnosis not present

## 2019-10-11 DIAGNOSIS — K219 Gastro-esophageal reflux disease without esophagitis: Secondary | ICD-10-CM | POA: Diagnosis not present

## 2019-10-11 DIAGNOSIS — R0902 Hypoxemia: Secondary | ICD-10-CM | POA: Diagnosis not present

## 2019-10-11 DIAGNOSIS — Z823 Family history of stroke: Secondary | ICD-10-CM | POA: Insufficient documentation

## 2019-10-11 DIAGNOSIS — Z992 Dependence on renal dialysis: Secondary | ICD-10-CM | POA: Diagnosis not present

## 2019-10-11 DIAGNOSIS — Z940 Kidney transplant status: Secondary | ICD-10-CM | POA: Insufficient documentation

## 2019-10-11 DIAGNOSIS — I132 Hypertensive heart and chronic kidney disease with heart failure and with stage 5 chronic kidney disease, or end stage renal disease: Secondary | ICD-10-CM | POA: Diagnosis not present

## 2019-10-11 DIAGNOSIS — N186 End stage renal disease: Secondary | ICD-10-CM

## 2019-10-11 DIAGNOSIS — R531 Weakness: Secondary | ICD-10-CM | POA: Diagnosis not present

## 2019-10-11 DIAGNOSIS — R1084 Generalized abdominal pain: Secondary | ICD-10-CM | POA: Diagnosis not present

## 2019-10-11 DIAGNOSIS — I5022 Chronic systolic (congestive) heart failure: Secondary | ICD-10-CM | POA: Diagnosis not present

## 2019-10-11 DIAGNOSIS — M1990 Unspecified osteoarthritis, unspecified site: Secondary | ICD-10-CM | POA: Insufficient documentation

## 2019-10-11 DIAGNOSIS — E7800 Pure hypercholesterolemia, unspecified: Secondary | ICD-10-CM | POA: Insufficient documentation

## 2019-10-11 DIAGNOSIS — Z881 Allergy status to other antibiotic agents status: Secondary | ICD-10-CM | POA: Diagnosis not present

## 2019-10-11 DIAGNOSIS — R52 Pain, unspecified: Secondary | ICD-10-CM | POA: Diagnosis not present

## 2019-10-11 DIAGNOSIS — Z79899 Other long term (current) drug therapy: Secondary | ICD-10-CM | POA: Diagnosis not present

## 2019-10-11 DIAGNOSIS — I2510 Atherosclerotic heart disease of native coronary artery without angina pectoris: Secondary | ICD-10-CM | POA: Insufficient documentation

## 2019-10-11 DIAGNOSIS — D631 Anemia in chronic kidney disease: Secondary | ICD-10-CM | POA: Diagnosis present

## 2019-10-11 DIAGNOSIS — R0602 Shortness of breath: Secondary | ICD-10-CM

## 2019-10-11 DIAGNOSIS — I10 Essential (primary) hypertension: Secondary | ICD-10-CM | POA: Diagnosis not present

## 2019-10-11 DIAGNOSIS — Z888 Allergy status to other drugs, medicaments and biological substances status: Secondary | ICD-10-CM | POA: Insufficient documentation

## 2019-10-11 DIAGNOSIS — E8770 Fluid overload, unspecified: Secondary | ICD-10-CM | POA: Diagnosis not present

## 2019-10-11 DIAGNOSIS — Z20828 Contact with and (suspected) exposure to other viral communicable diseases: Secondary | ICD-10-CM | POA: Insufficient documentation

## 2019-10-11 DIAGNOSIS — I480 Paroxysmal atrial fibrillation: Secondary | ICD-10-CM | POA: Diagnosis present

## 2019-10-11 DIAGNOSIS — Z8673 Personal history of transient ischemic attack (TIA), and cerebral infarction without residual deficits: Secondary | ICD-10-CM | POA: Diagnosis not present

## 2019-10-11 DIAGNOSIS — E785 Hyperlipidemia, unspecified: Secondary | ICD-10-CM | POA: Diagnosis present

## 2019-10-11 DIAGNOSIS — N189 Chronic kidney disease, unspecified: Secondary | ICD-10-CM | POA: Diagnosis present

## 2019-10-11 DIAGNOSIS — I120 Hypertensive chronic kidney disease with stage 5 chronic kidney disease or end stage renal disease: Secondary | ICD-10-CM | POA: Diagnosis not present

## 2019-10-11 DIAGNOSIS — R110 Nausea: Secondary | ICD-10-CM | POA: Diagnosis not present

## 2019-10-11 LAB — TROPONIN I (HIGH SENSITIVITY)
Troponin I (High Sensitivity): 17 ng/L (ref <18)
Troponin I (High Sensitivity): 18 ng/L — ABNORMAL HIGH (ref <18)

## 2019-10-11 LAB — CBC WITH DIFFERENTIAL/PLATELET
Abs Immature Granulocytes: 0.03 10*3/uL (ref 0.00–0.07)
Basophils Absolute: 0 10*3/uL (ref 0.0–0.1)
Basophils Relative: 0 %
Eosinophils Absolute: 0.1 10*3/uL (ref 0.0–0.5)
Eosinophils Relative: 3 %
HCT: 28.3 % — ABNORMAL LOW (ref 36.0–46.0)
Hemoglobin: 9 g/dL — ABNORMAL LOW (ref 12.0–15.0)
Immature Granulocytes: 1 %
Lymphocytes Relative: 15 %
Lymphs Abs: 0.8 10*3/uL (ref 0.7–4.0)
MCH: 30.1 pg (ref 26.0–34.0)
MCHC: 31.8 g/dL (ref 30.0–36.0)
MCV: 94.6 fL (ref 80.0–100.0)
Monocytes Absolute: 0.5 10*3/uL (ref 0.1–1.0)
Monocytes Relative: 9 %
Neutro Abs: 3.6 10*3/uL (ref 1.7–7.7)
Neutrophils Relative %: 72 %
Platelets: 222 10*3/uL (ref 150–400)
RBC: 2.99 MIL/uL — ABNORMAL LOW (ref 3.87–5.11)
RDW: 18.1 % — ABNORMAL HIGH (ref 11.5–15.5)
WBC: 5.1 10*3/uL (ref 4.0–10.5)
nRBC: 0 % (ref 0.0–0.2)

## 2019-10-11 LAB — COMPREHENSIVE METABOLIC PANEL
ALT: 12 U/L (ref 0–44)
AST: 23 U/L (ref 15–41)
Albumin: 3.5 g/dL (ref 3.5–5.0)
Alkaline Phosphatase: 84 U/L (ref 38–126)
Anion gap: 16 — ABNORMAL HIGH (ref 5–15)
BUN: 41 mg/dL — ABNORMAL HIGH (ref 8–23)
CO2: 28 mmol/L (ref 22–32)
Calcium: 9.7 mg/dL (ref 8.9–10.3)
Chloride: 98 mmol/L (ref 98–111)
Creatinine, Ser: 9.78 mg/dL — ABNORMAL HIGH (ref 0.44–1.00)
GFR calc Af Amer: 4 mL/min — ABNORMAL LOW (ref >60)
GFR calc non Af Amer: 4 mL/min — ABNORMAL LOW (ref >60)
Glucose, Bld: 97 mg/dL (ref 70–99)
Potassium: 3.7 mmol/L (ref 3.5–5.1)
Sodium: 142 mmol/L (ref 135–145)
Total Bilirubin: 1 mg/dL (ref 0.3–1.2)
Total Protein: 6.7 g/dL (ref 6.5–8.1)

## 2019-10-11 LAB — SARS CORONAVIRUS 2 BY RT PCR (HOSPITAL ORDER, PERFORMED IN ~~LOC~~ HOSPITAL LAB): SARS Coronavirus 2: NEGATIVE

## 2019-10-11 LAB — MRSA PCR SCREENING: MRSA by PCR: NEGATIVE

## 2019-10-11 LAB — POC OCCULT BLOOD, ED: Fecal Occult Bld: NEGATIVE

## 2019-10-11 MED ORDER — ZOLPIDEM TARTRATE 5 MG PO TABS
5.0000 mg | ORAL_TABLET | Freq: Every evening | ORAL | Status: DC | PRN
  Administered 2019-10-11: 5 mg via ORAL
  Filled 2019-10-11: qty 1

## 2019-10-11 MED ORDER — SODIUM CHLORIDE 0.9 % IV SOLN: 100.0000 mL | INTRAVENOUS | Status: DC | PRN

## 2019-10-11 MED ORDER — FERRIC CITRATE 1 GM 210 MG(FE) PO TABS
420.0000 mg | ORAL_TABLET | Freq: Three times a day (TID) | ORAL | Status: DC
  Administered 2019-10-11 – 2019-10-12 (×3): 420 mg via ORAL
  Filled 2019-10-11 (×3): qty 2

## 2019-10-11 MED ORDER — SIMVASTATIN 20 MG PO TABS
20.0000 mg | ORAL_TABLET | Freq: Every day | ORAL | Status: DC
  Administered 2019-10-11: 20 mg via ORAL
  Filled 2019-10-11: qty 1

## 2019-10-11 MED ORDER — ACETAMINOPHEN 650 MG RE SUPP: 650.0000 mg | Freq: Four times a day (QID) | RECTAL | Status: DC | PRN

## 2019-10-11 MED ORDER — LABETALOL HCL 200 MG PO TABS
200.0000 mg | ORAL_TABLET | Freq: Two times a day (BID) | ORAL | Status: DC
  Administered 2019-10-11: 200 mg via ORAL
  Filled 2019-10-11: qty 1

## 2019-10-11 MED ORDER — ONDANSETRON HCL 4 MG PO TABS: 4.0000 mg | ORAL_TABLET | Freq: Four times a day (QID) | ORAL | Status: DC | PRN

## 2019-10-11 MED ORDER — CHLORHEXIDINE GLUCONATE CLOTH 2 % EX PADS: 6.0000 | MEDICATED_PAD | Freq: Every day | CUTANEOUS | Status: DC

## 2019-10-11 MED ORDER — LOSARTAN POTASSIUM 50 MG PO TABS
50.0000 mg | ORAL_TABLET | Freq: Every day | ORAL | Status: DC
  Administered 2019-10-11: 50 mg via ORAL
  Filled 2019-10-11: qty 1

## 2019-10-11 MED ORDER — LIDOCAINE-PRILOCAINE 2.5-2.5 % EX CREA
1.0000 "application " | TOPICAL_CREAM | CUTANEOUS | Status: DC | PRN
  Filled 2019-10-11: qty 5

## 2019-10-11 MED ORDER — ALTEPLASE 2 MG IJ SOLR: 2.0000 mg | Freq: Once | INTRAMUSCULAR | Status: DC | PRN

## 2019-10-11 MED ORDER — HEPARIN SODIUM (PORCINE) 5000 UNIT/ML IJ SOLN
5000.0000 [IU] | Freq: Three times a day (TID) | INTRAMUSCULAR | Status: DC
  Filled 2019-10-11: qty 1

## 2019-10-11 MED ORDER — DOCUSATE SODIUM 283 MG RE ENEM
1.0000 | ENEMA | RECTAL | Status: DC | PRN
  Filled 2019-10-11: qty 1

## 2019-10-11 MED ORDER — HEPARIN SODIUM (PORCINE) 1000 UNIT/ML DIALYSIS
1000.0000 [IU] | INTRAMUSCULAR | Status: DC | PRN
  Filled 2019-10-11: qty 1

## 2019-10-11 MED ORDER — SORBITOL 70 % SOLN
30.0000 mL | Status: DC | PRN
  Filled 2019-10-11: qty 30

## 2019-10-11 MED ORDER — CALCIUM CARBONATE ANTACID 1250 MG/5ML PO SUSP
500.0000 mg | Freq: Four times a day (QID) | ORAL | Status: DC | PRN
  Filled 2019-10-11: qty 5

## 2019-10-11 MED ORDER — PENTAFLUOROPROP-TETRAFLUOROETH EX AERO
1.0000 "application " | INHALATION_SPRAY | CUTANEOUS | Status: DC | PRN
  Filled 2019-10-11: qty 116

## 2019-10-11 MED ORDER — ONDANSETRON HCL 4 MG/2ML IJ SOLN: 4.0000 mg | Freq: Four times a day (QID) | INTRAMUSCULAR | Status: DC | PRN

## 2019-10-11 MED ORDER — HYDROXYZINE HCL 25 MG PO TABS
25.0000 mg | ORAL_TABLET | Freq: Three times a day (TID) | ORAL | Status: DC | PRN
  Administered 2019-10-12: 25 mg via ORAL
  Filled 2019-10-11: qty 1

## 2019-10-11 MED ORDER — ALLOPURINOL 300 MG PO TABS
150.0000 mg | ORAL_TABLET | Freq: Every day | ORAL | Status: DC
  Administered 2019-10-11: 150 mg via ORAL
  Filled 2019-10-11: qty 1

## 2019-10-11 MED ORDER — AMLODIPINE BESYLATE 10 MG PO TABS
10.0000 mg | ORAL_TABLET | Freq: Every day | ORAL | Status: DC
  Administered 2019-10-11: 10 mg via ORAL
  Filled 2019-10-11: qty 1

## 2019-10-11 MED ORDER — CAMPHOR-MENTHOL 0.5-0.5 % EX LOTN
1.0000 "application " | TOPICAL_LOTION | Freq: Three times a day (TID) | CUTANEOUS | Status: DC | PRN
  Filled 2019-10-11: qty 222

## 2019-10-11 MED ORDER — NEPRO/CARBSTEADY PO LIQD
237.0000 mL | Freq: Three times a day (TID) | ORAL | Status: DC | PRN
  Filled 2019-10-11: qty 237

## 2019-10-11 MED ORDER — ACETAMINOPHEN 325 MG PO TABS: 650.0000 mg | ORAL_TABLET | Freq: Four times a day (QID) | ORAL | Status: DC | PRN

## 2019-10-11 MED ORDER — LIDOCAINE HCL (PF) 1 % IJ SOLN: 5.0000 mL | INTRAMUSCULAR | Status: DC | PRN

## 2019-10-11 MED ORDER — ALPRAZOLAM 0.25 MG PO TABS
0.2500 mg | ORAL_TABLET | Freq: Once | ORAL | Status: AC
  Administered 2019-10-11: 13:00:00 0.25 mg via ORAL
  Filled 2019-10-11: qty 1

## 2019-10-11 NOTE — Progress Notes (Addendum)
[facility] Consult Note:  Patient presented to the ED due to weakness and SOB starting this AM.  She felt so weak she could not get up to go to dialysis and came to the ED instead.  Denies CP, fever, chills, n/v/d, abdominal pain.  Reports LE edema.  Reports she has not been reaching her dry weight at dialysis but "they are taking what they can".    Pertinent findings include CXR showing vascular congestion and likely interstitial edema, hypertension, and requiring 2L O2 via .   Plan: For HD today for volume removal and reassess in AM if extra HD needed.    Outpatient HD orders: [place] - MWF 3.75hrs 350/AF 1.5   EDW 55kg 2K 2CA UF profile 4 LU AVF Hep 3000 Hect 36mcg qHD Venofer 50mg  qwk Mircera 23mcg q2wks - last 10/19  Will follow patient and if admitted as inpatient will complete formal Consult.   Jen Mow, PA-C [facility] Pager: [phone]  Pt seen, examined and agree w A/P as above.  Kelly Splinter  MD 10/11/2019, 4:28 PM

## 2019-10-11 NOTE — ED Notes (Signed)
Report given to HD ?

## 2019-10-11 NOTE — H&P (Signed)
History and Physical    [name] CLE:[id number] DOB: 06/09/1951 DOA: 10/11/2019  PCP: [name], MD Consultants:  Nephrology; Ronalee Belts - vascular Patient coming from:  Home - lives with mother; NOK: Sister or son, [phone], [phone]  Chief Complaint: Weakness  HPI: [name] is a 68 y.o. female with medical history significant of TIA;  HTN; HLD; ESRD on MWF HD; subclavian aneurysm; CAD; and CHF presenting with weakness.  She reports weakness.  She takes a few steps and has to sit down, just doesn't have energy.  Symptoms have been present for a few weeks.  She called her PCP and asked about getting a walker.  She found one, but it doesn't have a seat.  She is too tired to even push the walker.  She was previously was walking a walker.  By the time she drives a few blocks to HD, she is almost too tired to walk in.  Mild SOB.  No fevers.  No sick contacts, does not know anyone with COVID.  She previously did Silver Sneakers but has not done that in a while.  She has been struggling with her mother, who is living with her and has dementia.  "That dementia is worse than I thought it was, it's getting on my nerves."  She was considering requesting a respite to get her out of the house.   ED Course:  ESRD on MWF HD with worsening fatigue and dyspnea. Hgb is 9, stable.  ?volume overload.  Heme negative.  SOB with walking, lying down.  No cough, infectious symptoms.  CXR with volume overload.  For HD today, needs extra fluid taken off.  Troponin not elevated.  Likely obs, HD 1-2 times, ?PT.  Review of Systems: As per HPI; otherwise review of systems reviewed and negative.   Ambulatory Status:  Ambulates with a walker  Past Medical History:  Diagnosis Date   Adenomatous polyp of colon 10/2010, 2006, 2015   Anemia in CKD (chronic kidney disease) 11/07/2012   s/p blood transfusion.    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  Had some early rejection then stable function for years, then had slow decline of function and went back on hemodialysis in 2012.  Gets HD TTS schedule at [facility] on [facility] still using L forearm AVF.      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[place])    left upper arm   Stroke ([place]) 11/2015   TIA   Weight loss, unintentional     Past Surgical History:  Procedure Laterality Date   AV FISTULA PLACEMENT     for dialysis   AV FISTULA PLACEMENT Left 11/22/2015   Procedure: ARTERIOVENOUS (AV) FISTULA CREATION-LEFT BRACHIOCEPHALIC;  Surgeon: Serafina Mitchell, MD;  Location: [place];  Service: Vascular;  Laterality: Left;   BACK SURGERY  CERVICAL FUSION     CHOLECYSTECTOMY  12/02/2012   Procedure: LAPAROSCOPIC CHOLECYSTECTOMY WITH INTRAOPERATIVE CHOLANGIOGRAM;  Surgeon: Edward Jolly, MD;  Location: [place];  Service: General;  Laterality: N/A;   EYE SURGERY Bilateral    cataract surgery   EYE SURGERY Left 2019   laser   HEMATOMA EVACUATION Left 12/24/2016   Procedure: EVACUATION HEMATOMA LEFT UPPER ARM;  Surgeon: Waynetta Sandy, MD;  Location: [place];  Service: Vascular;  Laterality: Left;   I&D EXTREMITY Left 12/31/2016   Procedure: IRRIGATION AND DEBRIDEMENT EXTREMITY;  Surgeon: Angelia Mould, MD;  Location: [place];  Service: Vascular;  Laterality: Left;   INSERTION OF DIALYSIS CATHETER Right 12/24/2016   Procedure: INSERTION OF DIALYSIS  CATHETER;  Surgeon: Waynetta Sandy, MD;  Location: [place];  Service: Vascular;  Laterality: Right;   INSERTION OF DIALYSIS CATHETER Right 02/04/2017   Procedure: INSERTION OF DIALYSIS CATHETER;  Surgeon: Waynetta Sandy, MD;  Location: [place];  Service: Vascular;  Laterality: Right;   [place] Left 10/23/2016   Procedure: Fistulagram;  Surgeon: Elam Dutch, MD;  Location: [place] [facility];  Service: Cardiovascular;  Laterality: Left;   RESECTION OF ARTERIOVENOUS FISTULA ANEURYSM Left 11/22/2015   Procedure: RESECTION OF LEFT RADIOCEPHALIC FISTULA ANEURYSM ;  Surgeon: Serafina Mitchell, MD;  Location: [place] [facility];  Service: Vascular;  Laterality: Left;   REVISON OF ARTERIOVENOUS FISTULA Left 12/22/2016   Procedure: REVISON OF LEFT ARTERIOVENOUS FISTULA;  Surgeon: Waynetta Sandy, MD;  Location: [place];  Service: Vascular;  Laterality: Left;   REVISON OF ARTERIOVENOUS FISTULA Left 02/04/2017   Procedure: REVISON OF LEFT UPPER ARM ARTERIOVENOUS FISTULA;  Surgeon: Waynetta Sandy, MD;  Location: [place];  Service: Vascular;  Laterality: Left;   UPPER EXTREMITY ANGIOGRAPHY Bilateral 09/19/2019   Procedure: UPPER EXTREMITY ANGIOGRAPHY;  Surgeon: Katha Cabal, MD;  Location: [place] [facility];  Service: Cardiovascular;  Laterality: Bilateral;    Social History   Socioeconomic History   Marital status: Widowed    Spouse name: Not on file   Number of children: 2   Years of education: Not on file   Highest education level: Not on file  Occupational History   Not on file  Social Needs   Financial resource strain: Not on file   Food insecurity    Worry: Not on file    Inability: Not on file   Transportation needs    Medical: Not on file    Non-medical: Not on file  Tobacco Use   Smoking status: Former Smoker    Types: Cigarettes    Quit date: 12/31/1991    Years since quitting: 27.7    Smokeless tobacco: Never Used  Substance and Sexual Activity   Alcohol use: No    Alcohol/week: 0.0 standard drinks   Drug use: No   Sexual activity: Never  Lifestyle   Physical activity    Days per week: Not on file    Minutes per session: Not on file   Stress: Not on file  Relationships   Social connections    Talks on phone: Not on file    Gets together: Not on file    Attends religious service: Not on file    Active member of club or organization: Not on file    Attends meetings of clubs or organizations: Not on file    Relationship status: Not on file   Intimate partner violence  Fear of current or ex partner: Not on file    Emotionally abused: Not on file    Physically abused: Not on file    Forced sexual activity: Not on file  Other Topics Concern   Not on file  Social History Narrative   Living with her mother    Right handed   Caffeine: only decaf clears ("no brown sodas" or coffee d/t kidney disease)   Low potassium foods d/t kidney disease    Allergies  Allergen Reactions   Sulfa Antibiotics Other (See Comments)    Both parents allergic-so will not take   Adhesive [Tape] Itching    Family History  Problem Relation Age of Onset   Colon cancer Brother    Cancer Brother    Coronary artery disease Mother 34   Hyperlipidemia Mother    Hypertension Mother    Stroke Maternal Aunt    Esophageal cancer Neg Hx    Stomach cancer Neg Hx    Rectal cancer Neg Hx     Prior to Admission medications   Medication Sig Start Date End Date Taking? Authorizing Provider  acetaminophen (TYLENOL) 500 MG tablet Take 1 tablet (500 mg total) by mouth every 6 (six) hours as needed. 09/05/18   Law, Bea Graff, PA-C  allopurinol (ZYLOPRIM) 300 MG tablet Take 150 mg by mouth at bedtime.    [provider]  amLODipine (NORVASC) 10 MG tablet Take 10 mg by mouth at bedtime.  07/19/19   [provider]  AURYXIA 1 GM 210 MG(Fe) tablet Take 420 mg  by mouth 3 (three) times daily with meals.  07/20/19   [provider]  B Complex-C-Folic Acid (DIALYVITE 825) 0.8 MG WAFR Take 1 Wafer by mouth daily. 10/25/18   [provider]  labetalol (NORMODYNE) 200 MG tablet Take 200 mg by mouth 2 (two) times daily.     [provider]  lactulose (CHRONULAC) 10 GM/15ML solution Take 20 g by mouth daily as needed for mild constipation.    [provider]  losartan (COZAAR) 50 MG tablet Take 1 tablet (50 mg total) by mouth at bedtime. 12/14/18   Mariel Aloe, MD  ondansetron (ZOFRAN) 8 MG tablet Take 8 mg by mouth 2 (two) times daily as needed for nausea or vomiting.  01/20/18   [provider]  simvastatin (ZOCOR) 20 MG tablet Take 1 tablet (20 mg total) by mouth at bedtime. 02/26/17   Velvet Bathe, MD  zolpidem (AMBIEN) 10 MG tablet Take 10 mg by mouth at bedtime as needed for sleep.     [provider]    Physical Exam: Vitals:   10/11/19 1552 10/11/19 1600 10/11/19 1630 10/11/19 1700  BP: (!) 159/80 (!) 138/56 (!) (P) 161/67 (!) (P) 155/82  Pulse: 74 96 (P) 70 (P) 67  Resp:      Temp:      TempSrc:      SpO2:      Weight:      Height:          General:  Appears calm and comfortable and is NAD  Eyes:  PERRL, EOMI, normal lids, iris  ENT:  grossly normal hearing, lips & tongue, mmm  Neck:  no LAD, masses or thyromegaly  Cardiovascular:  RRR, no m/r/g. No LE edema.   Respiratory:   CTA bilaterally with scant bibasilar crackles.  Normal respiratory effort.  Abdomen:  soft, NT, ND, NABS  Skin:  no rash or induration seen on limited  exam  Musculoskeletal:  grossly normal tone BUE/BLE, good ROM, no bony abnormality  Psychiatric:  grossly normal mood and affect, speech fluent and appropriate, AOx3  Neurologic:  CN 2-12 grossly intact, moves all extremities in coordinated fashion, sensation intact    Radiological Exams on Admission: Dg Chest Port 1 View  Result Date:  10/11/2019 CLINICAL DATA:  Weakness, shortness of breath EXAM: PORTABLE CHEST 1 VIEW COMPARISON:  12/13/2018 FINDINGS: Cardiomegaly with vascular congestion. Interstitial prominence noted which could reflect early interstitial edema. Somewhat more focal opacity at the right lung base. No effusions. No acute bony abnormality. IMPRESSION: Cardiomegaly, vascular congestion. Question early interstitial edema. Focal opacity at the right lung base could reflect edema or infiltrates. Electronically Signed   By: Rolm Baptise M.D.   On: 10/11/2019 12:11    EKG: Independently reviewed.  NSR with rate 76; no evidence of acute ischemia, NSCSLT   Labs on Admission: I have personally reviewed the available labs and imaging studies at the time of the admission.  Pertinent labs:   BUN 41/Creatinine 9.78/GFR 4 Anion gap 16 HS troponin 17 WBC 5.1 Hgb 9.0 Heme negative COVID negative   Assessment/Plan Principal Problem:   Volume overload Active Problems:   Anemia in chronic kidney disease   Hyperlipidemia   ESRD (end stage renal disease) on dialysis [facility])   Atrial fibrillation (HCC)   Essential (primary) hypertension   Weakness    Volume overload in an ESRD on HD patient -Suspect this is less related to heart failure and more related to ESRD-associated volume overload, may need additional fluid pulled off during HD -Since she is dialysis-dependent, she was unable to clear the excess fluid -She is likely to benefit from serial HD both today and tomorrow and may be appropriate for d/c to home tomorrow after HD -Patient on chronic MWF HD -Nephrology prn order set utilized -Nephrology consulted, for HD today -She does have a fistula/subclavian aneurysm; per recent vascular appt, she is able to continue to use the fistula for now  Weakness -Other than as above, there does not appear to be an obvious physiologic cause -Considerations include volume overload as the cause; progressive deconditioning;  and physiologic stress - likely a combination of all -Patient moved her mother in and her mother has dementia -She reports that her mother is much more difficult to care for than she anticipated -She does not believe placement is an option and yet her sister also cannot take care of her mother -She reports a need for "respite" -Will order PT, OT, and SW consults  HTN -Continue Norvasc, labetatol, Cozaar  HLD -Continue Zocor  Afib -Rate controlled -Not on AC -In NSR at this time    Note: This patient has been tested and is negative for the novel coronavirus COVID-19.  DVT prophylaxis: Heparin Code Status:  Full - confirmed with patient Family Communication: None present  Disposition Plan:  Home once clinically improved Consults called: Nephrology; PT/OT/SW  Admission status: It is my clinical opinion that referral for OBSERVATION is reasonable and necessary in this patient based on the above information provided. The aforementioned taken together are felt to place the patient at high risk for further clinical deterioration. However it is anticipated that the patient may be medically stable for discharge from the hospital within 24 to 48 hours.    Karmen Bongo MD [facility]   How to contact the [facility] Attending or Consulting provider [place] or covering provider during after hours [place], for this  patient?  1. Check the care team in [facility] and look for a) attending/consulting TRH provider listed and b) the [facility] team listed 2. Log into [email or web address] and use [place]'s universal password to access. If you do not have the password, please contact the hospital operator. 3. Locate the [facility] provider you are looking for under [facility] and page to a number that you can be directly reached. 4. If you still have difficulty reaching the provider, please page the [facility] (Director on Call) for the Hospitalists listed on amion for assistance.   10/11/2019, 5:59 PM

## 2019-10-11 NOTE — ED Triage Notes (Addendum)
Pt presents to ED via [facility] w/weakness x1 week. Increase weakness today, scheduled for a blood transfusion on Friday 10/13/2019. Associated with nausea and abd pain that has now subsided. Pt reports she needs to take a break when going from sitting to standing. Pt has dialysis M/W/F, unable to go today due to weakness. Pt also c/o knots to her Left thigh.

## 2019-10-11 NOTE — ED Provider Notes (Signed)
[facility] EMERGENCY DEPARTMENT Provider Note   CSN: [id number] Arrival date & time: 10/11/19  1050     History   Chief Complaint Chief Complaint  Patient presents with  . Weakness    HPI TONIETTE DEVERA is a 68 y.o. female.     Patient is a 68 year old female with a history of end-stage renal disease on dialysis Monday Wednesday Friday, CHF, stroke, hypertension, prior GI bleeding years ago after colonoscopy who presents today with generalized weakness.  Patient states for the last several weeks she has had worsening generalized fatigue.  She initially noticed it with walking and doing activities she felt like she would need to sit down to rest and catch her breath but now even standing makes her feel like she needs to sit down and rest.  It is significantly worsened over the last week.  Also today she noticed a black bowel movement.  She denies any abdominal pain and has been eating and drinking normally.  She does feel short of breath with ambulating and with lying down but has not had any localized chest pain.  She did see her vascular surgeon last week and at that time was noted to feel weak with a hemoglobin drop from 11-9 she was scheduled for a blood transfusion on Friday but does not feel like she can wait.  She was supposed to have dialysis today but came here instead because she was unable to to get the strength to get up and go.  She denies any fever, cough vomiting or diarrhea.  The history is provided by the patient.  Weakness Severity:  Severe Onset quality:  Gradual Duration:  2 weeks Timing:  Constant Progression:  Worsening Chronicity:  Recurrent Context comment:  Dark stools over the last 2 days  Relieved by:  Rest Worsened by:  Activity Associated symptoms: difficulty walking, melena and shortness of breath   Associated symptoms: no abdominal pain, no anorexia, no chest pain, no dysuria, no fever, no loss of consciousness, no nausea, no  syncope and no vomiting   Risk factors: anemia, coronary artery disease and heart disease   Risk factors: no diabetes and no new medications     Past Medical History:  Diagnosis Date  . Adenomatous polyp of colon 10/2010, 2006, 2015  . Anemia in CKD (chronic kidney disease) 11/07/2012   s/p blood transfusion.   . Arthritis   . CAD (coronary artery disease)    "something like that"  . CHF (congestive heart failure) ([place])   . Constipation   . Depression with anxiety   . Dialysis patient ([place])    Mon-Wed-Fri  . Diverticula, colon   . 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  Had deceased donor kidney transplant in 1996.  Had some early rejection then stable function for years, then had slow decline of function and went back on hemodialysis in 2012.  Gets HD TTS schedule at [facility] on [facility] still using L forearm AVF.     Marland Kitchen GERD (gastroesophageal reflux disease)   . GI bleed 2017   felt to be ischemic colitis, last colo 2015  . Headache   . Hypercholesterolemia   . Hyperlipidemia   . Hypertension   . Neurologic gait dysfunction   . Neuromuscular disorder (HCC)    neuropathy hand and legs  . Osteoporosis   . Pneumonia   . Pseudoaneurysm of surgical AV  fistula (HCC)    left upper arm  . Stroke [facility]) 11/2015   TIA  . Stroke-like symptoms 11/2015  . Vertigo   . 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 Weight loss, unintentional     Patient Active Problem List   Diagnosis Date Noted  . 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 Dependence on renal dialysis ([place]) 07/24/2019  . Iron deficiency anemia, unspecified 06/09/2019  . Acute on chronic systolic (congestive) heart failure ([place]) 04/17/2019  . Age-related osteoporosis without current pathological fracture 04/17/2019  . 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 Coagulation  defect, unspecified ([place]) 04/17/2019  . Diarrhea, unspecified 04/17/2019  . Encounter for screening for depression 04/17/2019  . Headache, unspecified 04/17/2019  . 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 Pain, unspecified 04/17/2019  . Polyp of colon 04/17/2019  . Primary generalized (osteo)arthritis 04/17/2019  . Pruritus, unspecified 04/17/2019  . Pure hypercholesterolemia, unspecified 04/17/2019  . Secondary hyperparathyroidism of renal origin ([place]) 04/17/2019  . Gastro-esophageal reflux disease without esophagitis 04/17/2019  . Essential (primary) hypertension 04/17/2019  . Hyperlipidemia, unspecified 04/17/2019  . Transient cerebral ischemic attack, unspecified 04/17/2019  . Hypoxemia 12/13/2018  . Atrial fibrillation ([place]) 03/23/2017  . Prolonged Q-T interval on ECG 02/24/2017  . Hypokalemia 02/24/2017  . Acute ischemic stroke ([place]) 02/23/2017  . Malnutrition of moderate degree 12/24/2016  . Problem with dialysis access ([place]) 12/21/2016  . Acute ischemic colitis ([place]) 05/16/2016  . Colitis 05/15/2016  . Rectal bleeding 05/15/2016  . Neurologic abnormality 11/19/2015  . Altered mental state 11/19/2015  . Hyperkalemia 11/19/2015  . Anxiety 11/19/2015  . Insomnia 11/19/2015  . ESRD (end stage renal disease) on dialysis ([place])   . Gait instability   . Stroke-like symptom 11/16/2015  . Vestibular neuritis 11/16/2015  . Stroke (cerebrum) ([place]) 11/16/2015  . Dizziness 05/09/2015  . Ataxia 05/09/2015  . H/O: CVA (cerebrovascular accident) 05/09/2015  . Left facial numbness 05/09/2015  . Left leg numbness 05/09/2015  . Hyperlipidemia   . CHF (congestive heart failure) ([place])   . Congestive heart disease ([place])   . Shortness of breath 04/01/2013  . Volume overload 04/01/2013  . HTN (hypertension) 04/01/2013  . Cholecystitis, acute 11/07/2012  . End stage renal disease ([place]) 11/07/2012  . GERD (gastroesophageal reflux disease) 11/07/2012  . Anemia in chronic kidney disease  11/07/2012    Past Surgical History:  Procedure Laterality Date  . AV FISTULA PLACEMENT     for dialysis  . AV FISTULA PLACEMENT Left 11/22/2015   Procedure: ARTERIOVENOUS (AV) FISTULA CREATION-LEFT BRACHIOCEPHALIC;  Surgeon: Serafina Mitchell, MD;  Location: [place];  Service: Vascular;  Laterality: Left;  . BACK SURGERY    . CERVICAL FUSION    . CHOLECYSTECTOMY  12/02/2012   Procedure: LAPAROSCOPIC CHOLECYSTECTOMY WITH INTRAOPERATIVE CHOLANGIOGRAM;  Surgeon: Edward Jolly, MD;  Location: [place];  Service: General;  Laterality: N/A;  . EYE SURGERY Bilateral    cataract surgery  . EYE SURGERY Left 2019   laser  . HEMATOMA EVACUATION Left 12/24/2016   Procedure: EVACUATION HEMATOMA LEFT UPPER ARM;  Surgeon: Waynetta Sandy, MD;  Location: [place];  Service: Vascular;  Laterality: Left;  . I&D EXTREMITY Left 12/31/2016   Procedure: IRRIGATION AND DEBRIDEMENT EXTREMITY;  Surgeon: Angelia Mould, MD;  Location: [place];  Service: Vascular;  Laterality: Left;  . INSERTION OF DIALYSIS CATHETER Right 12/24/2016   Procedure: INSERTION OF DIALYSIS CATHETER;  Surgeon:  Waynetta Sandy, MD;  Location: [place];  Service: Vascular;  Laterality: Right;  . INSERTION OF DIALYSIS CATHETER Right 02/04/2017   Procedure: INSERTION OF DIALYSIS CATHETER;  Surgeon: Waynetta Sandy, MD;  Location: [place];  Service: Vascular;  Laterality: Right;  . KIDNEY TRANSPLANT  1996  . PERIPHERAL VASCULAR CATHETERIZATION Left 10/23/2016   Procedure: Fistulagram;  Surgeon: Elam Dutch, MD;  Location: [place] CV LAB;  Service: Cardiovascular;  Laterality: Left;  . RESECTION OF ARTERIOVENOUS FISTULA ANEURYSM Left 11/22/2015   Procedure: RESECTION OF LEFT RADIOCEPHALIC FISTULA ANEURYSM ;  Surgeon: Serafina Mitchell, MD;  Location: [place];  Service: Vascular;  Laterality: Left;  . REVISON OF ARTERIOVENOUS FISTULA Left 12/22/2016   Procedure: REVISON OF LEFT ARTERIOVENOUS FISTULA;  Surgeon: Waynetta Sandy, MD;  Location: [place];  Service: Vascular;  Laterality: Left;  . REVISON OF ARTERIOVENOUS FISTULA Left 02/04/2017   Procedure: REVISON OF LEFT UPPER ARM ARTERIOVENOUS FISTULA;  Surgeon: Waynetta Sandy, MD;  Location: [place];  Service: Vascular;  Laterality: Left;  . UPPER EXTREMITY ANGIOGRAPHY Bilateral 09/19/2019   Procedure: UPPER EXTREMITY ANGIOGRAPHY;  Surgeon: Katha Cabal, MD;  Location: [place] CV LAB;  Service: Cardiovascular;  Laterality: Bilateral;     OB History   No obstetric history on file.      Home Medications    Prior to Admission medications   Medication Sig Start Date End Date Taking? Authorizing Provider  acetaminophen (TYLENOL) 500 MG tablet Take 1 tablet (500 mg total) by mouth every 6 (six) hours as needed. 09/05/18   Law, Bea Graff, PA-C  allopurinol (ZYLOPRIM) 300 MG tablet Take 150 mg by mouth at bedtime.    [provider]  amLODipine (NORVASC) 10 MG tablet Take 10 mg by mouth at bedtime.  07/19/19   [provider]  AURYXIA 1 GM 210 MG(Fe) tablet Take 420 mg by mouth 3 (three) times daily with meals.  07/20/19   [provider]  B Complex-C-Folic Acid (DIALYVITE 833) 0.8 MG WAFR Take 1 Wafer by mouth daily. 10/25/18   [provider]  labetalol (NORMODYNE) 200 MG tablet Take 200 mg by mouth 2 (two) times daily.     [provider]  lactulose (CHRONULAC) 10 GM/15ML solution Take 20 g by mouth daily as needed for mild constipation.    [provider]  losartan (COZAAR) 50 MG tablet Take 1 tablet (50 mg total) by mouth at bedtime. 12/14/18   Mariel Aloe, MD  ondansetron (ZOFRAN) 8 MG tablet Take 8 mg by mouth 2 (two) times daily as needed for nausea or vomiting.  01/20/18   [provider]  simvastatin (ZOCOR) 20 MG tablet Take 1 tablet (20 mg total) by mouth at bedtime. 02/26/17   Velvet Bathe, MD  zolpidem (AMBIEN) 10 MG tablet Take 10 mg by mouth at bedtime as needed  for sleep.     [provider]    Family History Family History  Problem Relation Age of Onset  . Colon cancer Brother   . Cancer Brother   . Coronary artery disease Mother 65  . Hyperlipidemia Mother   . Hypertension Mother   . Stroke Maternal Aunt   . Esophageal cancer Neg Hx   . Stomach cancer Neg Hx   . Rectal cancer Neg Hx     Social History Social History   Tobacco Use  . Smoking status: Former Smoker    Types: Cigarettes    Quit  date: 12/31/1991    Years since quitting: 27.7  . Smokeless tobacco: Never Used  Substance Use Topics  . Alcohol use: No    Alcohol/week: 0.0 standard drinks  . Drug use: No     Allergies   Sulfa antibiotics and Adhesive [tape]   Review of Systems Review of Systems  Constitutional: Negative for fever.  Respiratory: Positive for shortness of breath.   Cardiovascular: Negative for chest pain and syncope.  Gastrointestinal: Positive for melena. Negative for abdominal pain, anorexia, nausea and vomiting.  Genitourinary: Negative for dysuria.  Neurological: Positive for weakness. Negative for loss of consciousness.  All other systems reviewed and are negative.    Physical Exam Updated Vital Signs BP (!) 163/87 (BP Location: Right Arm)   Pulse 76   Temp 98.4 F (36.9 C) (Oral)   Resp 19   Ht 5\' 2"  (1.575 m)   Wt 54.4 kg   SpO2 97%   BMI 21.95 kg/m   Physical Exam Vitals signs and nursing note reviewed.  Constitutional:      General: She is not in acute distress.    Appearance: Normal appearance. She is well-developed and normal weight.  HENT:     Head: Normocephalic and atraumatic.  Eyes:     Pupils: Pupils are equal, round, and reactive to light.     Comments: Pale conjunctive a  Cardiovascular:     Rate and Rhythm: Normal rate and regular rhythm.     Heart sounds: Murmur present. No friction rub.  Pulmonary:     Effort: Pulmonary effort is normal.     Breath sounds: Normal breath sounds. No wheezing or  rales.  Abdominal:     General: Bowel sounds are normal. There is no distension.     Palpations: Abdomen is soft.     Tenderness: There is no abdominal tenderness. There is no guarding or rebound.  Genitourinary:    Comments: No significant stool in the rectum but slightly dark without gross blood on rectal exam Musculoskeletal: Normal range of motion.        General: No tenderness.     Comments: No edema.  Fistula present in the left upper extremity with palpable thrill  Skin:    General: Skin is warm and dry.     Coloration: Skin is pale.     Findings: No rash.  Neurological:     General: No focal deficit present.     Mental Status: She is alert and oriented to person, place, and time. Mental status is at baseline.     Cranial Nerves: No cranial nerve deficit.  Psychiatric:        Mood and Affect: Mood normal.        Behavior: Behavior normal.      ED Treatments / Results  Labs (all labs ordered are listed, but only abnormal results are displayed) Labs Reviewed  CBC WITH DIFFERENTIAL/PLATELET - Abnormal; Notable for the following components:      Result Value   RBC 2.99 (*)    Hemoglobin 9.0 (*)    HCT 28.3 (*)    RDW 18.1 (*)    All other components within normal limits  COMPREHENSIVE METABOLIC PANEL - Abnormal; Notable for the following components:   BUN 41 (*)    Creatinine, Ser 9.78 (*)    GFR calc non Af Amer 4 (*)    GFR calc Af Amer 4 (*)    Anion gap 16 (*)    All other components within normal  limits  SARS CORONAVIRUS 2 (TAT 6-24 HRS)  POC OCCULT BLOOD, ED  TYPE AND SCREEN  TROPONIN I (HIGH SENSITIVITY)  TROPONIN I (HIGH SENSITIVITY)    EKG EKG Interpretation  Date/Time:  Wednesday October 11 2019 11:06:08 EDT Ventricular Rate:  76 PR Interval:    QRS Duration: 93 QT Interval:  425 QTC Calculation: 478 R Axis:   81 Text Interpretation: Sinus rhythm Borderline right axis deviation No significant change since last tracing Confirmed by Blanchie Dessert ([id number]) on 10/11/2019 11:26:42 AM   Radiology Dg Chest Port 1 View  Result Date: 10/11/2019 CLINICAL DATA:  Weakness, shortness of breath EXAM: PORTABLE CHEST 1 VIEW COMPARISON:  12/13/2018 FINDINGS: Cardiomegaly with vascular congestion. Interstitial prominence noted which could reflect early interstitial edema. Somewhat more focal opacity at the right lung base. No effusions. No acute bony abnormality. IMPRESSION: Cardiomegaly, vascular congestion. Question early interstitial edema. Focal opacity at the right lung base could reflect edema or infiltrates. Electronically Signed   By: Rolm Baptise M.D.   On: 10/11/2019 12:11    Procedures Procedures (including critical care time)  Medications Ordered in ED Medications - No data to display   Initial Impression / Assessment and Plan / ED Course  I have reviewed the triage vital signs and the nursing notes.  Pertinent labs & imaging results that were available during my care of the patient were reviewed by me and considered in my medical decision making (see chart for details).        Patient presenting with 2 weeks of worsening generalized weakness which is exacerbated with exertion.  Patient is a dialysis patient and last dialyzed on Monday full course.  Concern for potential anemia as the source of her fatigue as well as she reports dark stools over the last 2 days.  Patient does not take anticoagulation.  Also concern for possible fluid overload or electrolyte abnormalities versus cardiac cause.  Patient is hemodynamically stable at this time.  Labs and imaging pending  12:59 PM Patient's fecal occult blood is negative, CBC with persistent hemoglobin of 9, CMP with findings consistent with chronic kidney disease and mild anion gap, troponin of 17 and chest x-ray with evidence of fluid overload.  Suspect patient's symptoms of exertional dyspnea, orthopnea and general fatigue may be due to fluid overload.  Patient has not had  significant change in hemoglobin and at this time will hold transfusion as volume may cause patient's symptoms to worsen.  Spoke with Dr. Justin Mend with nephrology who will contact the PA to see the patient for dialysis and removal of fluid.  Will admit for further care.  Patient is having no infectious symptoms concerning for pneumonia at this time.  Final Clinical Impressions(s) / ED Diagnoses   Final diagnoses:  Generalized weakness  Hypervolemia, unspecified hypervolemia type  ESRD (end stage renal disease) [facility])    ED Discharge Orders    None       Blanchie Dessert, MD 10/11/19 1301

## 2019-10-12 ENCOUNTER — Other Ambulatory Visit (HOSPITAL_COMMUNITY): Payer: Self-pay | Admitting: *Deleted

## 2019-10-12 ENCOUNTER — Observation Stay (HOSPITAL_COMMUNITY): Payer: Medicare Other

## 2019-10-12 DIAGNOSIS — E8770 Fluid overload, unspecified: Secondary | ICD-10-CM | POA: Diagnosis not present

## 2019-10-12 DIAGNOSIS — I132 Hypertensive heart and chronic kidney disease with heart failure and with stage 5 chronic kidney disease, or end stage renal disease: Secondary | ICD-10-CM | POA: Diagnosis not present

## 2019-10-12 DIAGNOSIS — R0602 Shortness of breath: Secondary | ICD-10-CM | POA: Diagnosis not present

## 2019-10-12 LAB — BASIC METABOLIC PANEL
Anion gap: 13 (ref 5–15)
BUN: 16 mg/dL (ref 8–23)
CO2: 27 mmol/L (ref 22–32)
Calcium: 9.6 mg/dL (ref 8.9–10.3)
Chloride: 97 mmol/L — ABNORMAL LOW (ref 98–111)
Creatinine, Ser: 5.5 mg/dL — ABNORMAL HIGH (ref 0.44–1.00)
GFR calc Af Amer: 8 mL/min — ABNORMAL LOW (ref >60)
GFR calc non Af Amer: 7 mL/min — ABNORMAL LOW (ref >60)
Glucose, Bld: 88 mg/dL (ref 70–99)
Potassium: 3.5 mmol/L (ref 3.5–5.1)
Sodium: 137 mmol/L (ref 135–145)

## 2019-10-12 LAB — CBC
HCT: 27.3 % — ABNORMAL LOW (ref 36.0–46.0)
Hemoglobin: 8.8 g/dL — ABNORMAL LOW (ref 12.0–15.0)
MCH: 30.1 pg (ref 26.0–34.0)
MCHC: 32.2 g/dL (ref 30.0–36.0)
MCV: 93.5 fL (ref 80.0–100.0)
Platelets: 236 10*3/uL (ref 150–400)
RBC: 2.92 MIL/uL — ABNORMAL LOW (ref 3.87–5.11)
RDW: 17.6 % — ABNORMAL HIGH (ref 11.5–15.5)
WBC: 4.7 10*3/uL (ref 4.0–10.5)
nRBC: 0 % (ref 0.0–0.2)

## 2019-10-12 MED ORDER — DOXERCALCIFEROL 4 MCG/2ML IV SOLN
4.0000 ug | INTRAVENOUS | Status: DC
  Administered 2019-10-12: 4 ug via INTRAVENOUS
  Filled 2019-10-12: qty 2

## 2019-10-12 MED ORDER — PENTAFLUOROPROP-TETRAFLUOROETH EX AERO
INHALATION_SPRAY | CUTANEOUS | Status: AC
  Administered 2019-10-12: 18:00:00
  Filled 2019-10-12: qty 116

## 2019-10-12 MED ORDER — HEPARIN SODIUM (PORCINE) 1000 UNIT/ML IJ SOLN
INTRAMUSCULAR | Status: AC
  Administered 2019-10-12: 3000 [IU]
  Filled 2019-10-12: qty 3

## 2019-10-12 MED ORDER — CHLORHEXIDINE GLUCONATE CLOTH 2 % EX PADS: 6.0000 | MEDICATED_PAD | Freq: Every day | CUTANEOUS | Status: DC

## 2019-10-12 MED ORDER — DOXERCALCIFEROL 4 MCG/2ML IV SOLN
INTRAVENOUS | Status: AC
  Filled 2019-10-12: qty 2

## 2019-10-12 NOTE — TOC Transition Note (Addendum)
Transition of Care [facility]) - CM/SW Discharge Note   Patient Details  Name: [name] MRN: [id number] Date of Birth: [birth year]  Transition of Care [facility]) CM/SW Contact:  Carles Collet, RN Phone Number: 10/12/2019, 4:22 PM   Clinical Narrative:    Attempted to contact patient several times by cell, in HD and their phones were not working for me to speak w patient. She would not be back in her room until after hours and will DC late tonight. HD secretary asked patient for preference for [facility] provider, patient did not have one, so I started at op of ratings list and [facility] was able to accept with [facility] for tomorrow. rollator will be delivered to room prior to DC. Family will provide transport home. No other CM needs identified    Final next level of care: Home w Home Health Services Barriers to Discharge: No Barriers Identified   Patient Goals and CMS Choice Patient states their goals for this hospitalization and ongoing recovery are:: to go home      Discharge Placement                       Discharge Plan and Services                rollator DME Agency: [employer] Date DME Agency Contacted: 10/12/19 Time DME Agency Contacted: 1200 Representative spoke with at DME Agency: zack HH Arranged: PT, OT HH Agency: [place] Date [place]: 10/12/19 Time [place]: [id number] Representative spoke with at [place]: cheryl  Social Determinants of Health ([place]) Interventions     Readmission Risk Interventions No flowsheet data found.

## 2019-10-12 NOTE — Progress Notes (Signed)
[name] to be discharged home per MD order. Discussed prescriptions and follow up appointments with the patient. Prescriptions given to patient; medication list explained in detail. Patient verbalized understanding.  Skin clean, dry and intact without evidence of skin break down, no evidence of skin tears noted. IV catheter discontinued intact. Site without signs and symptoms of complications. Dressing and pressure applied. Pt denies pain at the site currently. No complaints noted.  Patient free of lines, drains, and wounds.   An After Visit Summary (AVS) was printed and given to the patient. Patient escorted via wheelchair, and discharged home via private auto.  [name]  Brianna Esson, RN

## 2019-10-12 NOTE — Progress Notes (Addendum)
  [facility] Progress Note   Brief Progress Note: Patient seen in room and reports improvement in weakness.  Denies SOB, CP, n/v/d, fever, chills and edema.  CXR this AM showed improvement in edema and atelectasis but still appears to have increased volume.  Plan for extra dialysis today for volume removal and could d/c home post from a renal standpoint.    Jen Mow, PA-C [facility] Pager: [phone] 10/12/2019,10:28 AM  LOS: 0 days   Pt seen, examined and agree w A/P as above.  Kelly Splinter  MD 10/12/2019, 4:00 PM

## 2019-10-12 NOTE — Evaluation (Signed)
Occupational Therapy Evaluation Patient Details Name: [name] MRN: [id number] DOB: May 30, 1951 Today's Date: 10/12/2019    History of Present Illness 68 yo female admitted to ED on 10/28 with weakness x1 week, missed HD appointment on 10/28 due to this. PMH includes HF,TIA and CVA, HTN, GIB, ESRD on HD MWF, CAD, depression/anxiety, osteoporosis, neuropathy hands/feet, vestibular neuritis, cervical fusion.   Clinical Impression   Pt admitted with above diagnoses, presenting with generalized weakness and decreased activity tolerance limiting ability to engage in BADL at desired level of ind. PTA pt lives with mother whom has dementia. She shares she does not do too much physical care for her, more home safety. At time of eval, pt is mod A for bed mobility and min A-min guard for sit <> stand and functional mobility. She fatigues very quickly and easily, requiring x3 rest breaks to get to the sink to brush her teeth. She is wanting to acquire a rollator to help with rest breaks. ECS strategies given for daily BADL routines. Recommend pt get HHOT with 24/7 hour assist at d/c. Will continue to follow acutely.     Follow Up Recommendations  Home health OT;Supervision/Assistance - 24 hour    Equipment Recommendations  3 in 1 bedside commode    Recommendations for Other Services       Precautions / Restrictions Precautions Precautions: Fall Precaution Comments: fatigues quickly Restrictions Weight Bearing Restrictions: No      Mobility Bed Mobility Overal bed mobility: Needs Assistance Bed Mobility: Supine to Sit     Supine to sit: Mod assist;HOB elevated     General bed mobility comments: Mod assist for trunk elevation off of bed, verbal cuing for self assist with bed rails.  Transfers Overall transfer level: Needs assistance Equipment used: Rolling walker (2 wheeled) Transfers: Sit to/from Stand Sit to Stand: Min guard         General transfer comment: Min guard  for safety, verbal cuing for safe hand placement on first to stands, able to properly place hands on support surface to rise on 3rd attempt. Sit to stand x3 during session due to pt fatigue with activity.    Balance Overall balance assessment: History of Falls;Needs assistance Sitting-balance support: No upper extremity supported;Feet supported Sitting balance-Leahy Scale: Fair Sitting balance - Comments: able to sit EOB unsupported   Standing balance support: Bilateral upper extremity supported Standing balance-Leahy Scale: Poor Standing balance comment: reliant on external support in standing; pt stood at sink x2 minutes for teeth brushing, requires tripod positioning of UEs for energy conservation and support                           ADL either performed or assessed with clinical judgement   ADL Overall ADL's : Needs assistance/impaired Eating/Feeding: Set up;Sitting   Grooming: Min guard;Standing;Oral care Grooming Details (indicate cue type and reason): min guard with 3 rest breaks to sustain task Upper Body Bathing: Set up;Sitting   Lower Body Bathing: Set up;Sit to/from stand;Sitting/lateral leans   Upper Body Dressing : Set up;Sitting   Lower Body Dressing: Set up;Sitting/lateral leans;Sit to/from stand   Toilet Transfer: Min guard;Regular Toilet;Grab bars;RW   Toileting- Water quality scientist and Hygiene: Set up;Sit to/from stand   Tub/ Shower Transfer: Min guard;Shower seat;Ambulation;3 in 1   Functional mobility during ADLs: Min guard;Rolling walker General ADL Comments: pt overall min guard-set up but needs inc time and rest breaks, fatigues easily  Vision Baseline Vision/History: No visual deficits Patient Visual Report: No change from baseline Vision Assessment?: No apparent visual deficits     Perception     Praxis      Pertinent Vitals/Pain Pain Assessment: No/denies pain     Hand Dominance Right   Extremity/Trunk Assessment Upper  Extremity Assessment Upper Extremity Assessment: Generalized weakness   Lower Extremity Assessment Lower Extremity Assessment: Defer to PT evaluation   Cervical / Trunk Assessment Cervical / Trunk Assessment: Normal   Communication Communication Communication: No difficulties   Cognition Arousal/Alertness: Awake/alert Behavior During Therapy: WFL for tasks assessed/performed Overall Cognitive Status: Within Functional Limits for tasks assessed                                 General Comments: pt very pleasant and talkative   General Comments       Exercises     Shoulder Instructions      Home Living Family/patient expects to be discharged to:: Private residence Living Arrangements: Parent Available Help at Discharge: Family;Other (Comment)(sister there frequently) Type of Home: House Home Access: Stairs to enter [employer] of Steps: one step down to sunroom   Home Layout: One level     Bathroom Shower/Tub: Tub/shower unit         Home Equipment: Shower seat;Cane - single point;Walker - 2 wheels          Prior Functioning/Environment Level of Independence: Independent        Comments: reports driving self to dialysis apts; mostly reports BADL increasing in difficulty 2/2 fatigue. Does not cook, eats out a lot        OT Problem List: Decreased strength;Decreased knowledge of use of DME or AE;Decreased activity tolerance;Impaired balance (sitting and/or standing)      OT Treatment/Interventions: Self-care/ADL training;Therapeutic exercise;Patient/family education;Balance training;Energy conservation;Therapeutic activities;DME and/or AE instruction    OT Goals(Current goals can be found in the care plan section) Acute Rehab OT Goals Patient Stated Goal: get my endurance up, get a rollator OT Goal Formulation: With patient Time For Goal Achievement: 10/26/19 Potential to Achieve Goals: Good  OT Frequency: Min 2X/week   Barriers  to D/C:            Co-evaluation PT/OT/SLP Co-Evaluation/Treatment: Yes Reason for Co-Treatment: For patient/therapist safety;To address functional/ADL transfers PT goals addressed during session: Mobility/safety with mobility OT goals addressed during session: ADL's and self-care      AM-PAC OT "6 Clicks" Daily Activity     Outcome Measure Help from another person eating meals?: A Little Help from another person taking care of personal grooming?: A Little Help from another person toileting, which includes using toliet, bedpan, or urinal?: A Little Help from another person bathing (including washing, rinsing, drying)?: A Little Help from another person to put on and taking off regular upper body clothing?: A Little Help from another person to put on and taking off regular lower body clothing?: A Little 6 Click Score: 18   End of Session Equipment Utilized During Treatment: Gait belt;Rolling walker Nurse Communication: Mobility status  Activity Tolerance: Patient tolerated treatment well Patient left: in bed;with call bell/phone within reach  OT Visit Diagnosis: Other abnormalities of gait and mobility (R26.89);Muscle weakness (generalized) (M62.81)                Time: 4008-6761 OT Time Calculation (min): 27 min Charges:  OT General Charges $OT Visit: 1 Visit OT Evaluation $OT  Eval Moderate Complexity: 1 Mod  Zenovia Jarred, MSOT, OTR/L Behavioral Health OT/ Acute Relief OT [facility] Office: [place] 10/12/2019, 1:17 PM

## 2019-10-12 NOTE — Discharge Summary (Signed)
Physician Discharge Summary  [name] EGB:[id number] DOB: 1951/10/25 DOA: 10/11/2019  PCP: [name], MD  Admit date: 10/11/2019 Discharge date: 10/12/2019  Admitted From: Home Disposition: Home  Recommendations for Outpatient Follow-up:  1. Follow up with PCP in 1-2 weeks 2. Please obtain BMP/CBC in one week 3. Please follow up on the following pending results:  Home Health: None Equipment/Devices: None  Discharge Condition: Able CODE STATUS: Full code Diet recommendation: Low-sodium  Subjective: Seen and examined.  She feels much better.  She is comfortable on room air saturating more than 92%.  She wants to go home.  I did explain to her that nephrology wants to do dialysis on her before she goes home.  It appeared that she did not like the idea however she was agreeable to that.  Brief/Interim Summary: [name] is a 68 y.o. female with medical history significant of TIA;  HTN; HLD; ESRD on MWF HD; subclavian aneurysm; CAD; and CHF presented to ED on 10/11/2019 with a complaint of weakness.  She was subsequently diagnosed to have volume overload, likely secondary to missing hemodialysis.  She was admitted to hospital service.  Nephrology was consulted.  She underwent dialysis on the night of 10/11/2019.  When seen today, she feels much better.  She is on room air currently and wants to go home.  Per my discussion with nephrologist, they want to do another dialysis on her before she goes home.  She is agreeable to that plan.  She will be discharged after dialysis today.    Discharge Diagnoses:  Principal Problem:   Volume overload Active Problems:   Anemia in chronic kidney disease   Hyperlipidemia   ESRD (end stage renal disease) on dialysis [facility]   Atrial fibrillation ([place])   Essential (primary) hypertension   Weakness    Discharge Instructions  Discharge Instructions    Discharge patient   Complete by: As directed    Please discharge after  dialysis today.   Discharge disposition: 01-Home or Self Care   Discharge patient date: 10/12/2019     Allergies as of 10/12/2019      Reactions   Sulfa Antibiotics Other (See Comments)   Both parents allergic-so will not take   Adhesive [tape] Itching      Medication List    TAKE these medications   acetaminophen 500 MG tablet Commonly known as: TYLENOL Take 1 tablet (500 mg total) by mouth every 6 (six) hours as needed. What changed: reasons to take this   allopurinol 300 MG tablet Commonly known as: ZYLOPRIM Take 150 mg by mouth at bedtime.   amLODipine 10 MG tablet Commonly known as: NORVASC Take 10 mg by mouth at bedtime.   Auryxia 1 GM 210 MG(Fe) tablet Generic drug: ferric citrate Take 420 mg by mouth 3 (three) times daily with meals.   Dialyvite 800 0.8 MG Wafr Take 1 Wafer by mouth daily.   labetalol 200 MG tablet Commonly known as: NORMODYNE Take 200 mg by mouth 2 (two) times daily.   lactulose 10 GM/15ML solution Commonly known as: CHRONULAC Take 20 g by mouth daily as needed for mild constipation.   losartan 50 MG tablet Commonly known as: COZAAR Take 1 tablet (50 mg total) by mouth at bedtime.   ondansetron 8 MG tablet Commonly known as: ZOFRAN Take 8 mg by mouth 2 (two) times daily as needed for nausea or vomiting.   simvastatin 20 MG tablet Commonly known as: ZOCOR Take 1 tablet (20 mg  total) by mouth at bedtime.   zolpidem 10 MG tablet Commonly known as: AMBIEN Take 10 mg by mouth at bedtime as needed for sleep.      Follow-up Information    [name], MD Follow up in 1 week(s).   Specialty: Internal Medicine Contact information: [address] [place] [address] [phone]          Allergies  Allergen Reactions  . Sulfa Antibiotics Other (See Comments)    Both parents allergic-so will not take  . Adhesive [Tape] Itching    Consultations: Nephrology   Procedures/Studies: Dg Chest 2  View  Result Date: 10/12/2019 CLINICAL DATA:  Shortness of breath. EXAM: CHEST - 2 VIEW COMPARISON:  10/11/2019 FINDINGS: The heart is enlarged but stable. Stable tortuosity, ectasia and calcification of the thoracic aorta. The lung bases appears slightly better aerated with resolving atelectasis. Mild central vascular congestion but no overt pulmonary edema. The Kerley B lines seen on the prior chest film have largely resolved. No pleural effusions. IMPRESSION: Stable cardiac enlargement. Resolving edema and atelectasis. Electronically Signed   By: Marijo Sanes M.D.   On: 10/12/2019 09:12   Dg Chest Port 1 View  Result Date: 10/11/2019 CLINICAL DATA:  Weakness, shortness of breath EXAM: PORTABLE CHEST 1 VIEW COMPARISON:  12/13/2018 FINDINGS: Cardiomegaly with vascular congestion. Interstitial prominence noted which could reflect early interstitial edema. Somewhat more focal opacity at the right lung base. No effusions. No acute bony abnormality. IMPRESSION: Cardiomegaly, vascular congestion. Question early interstitial edema. Focal opacity at the right lung base could reflect edema or infiltrates. Electronically Signed   By: Rolm Baptise M.D.   On: 10/11/2019 12:11   Vas US Duplex Dialysis Access (avf, Avg)  Result Date: 09/14/2019 DIALYSIS ACCESS Reason for Exam: Routine follow up. Access Site: Left Upper Extremity. Access Type: Brachial-cephalic AVF. History: Left radceph converted to brachceph AVF 11/22/2015. 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          No complaints or issue with dialysis. Comparison Study: 03/02/2016 Performing Technologist: Concha Norway RVT  Examination Guidelines: A complete evaluation includes B-mode imaging, spectral Doppler, color Doppler, and power Doppler as needed of all accessible portions of each vessel. Unilateral testing is considered an integral part of a complete examination. Limited examinations for reoccurring  indications may be performed as noted.  Findings: +--------------------+----------+-----------------+--------+ AVF                 PSV (cm/s)Flow Vol (mL/min)Comments +--------------------+----------+-----------------+--------+ Native artery inflow   176          2042                +--------------------+----------+-----------------+--------+ AVF Anastomosis        128                              +--------------------+----------+-----------------+--------+  +---------------+----------+-------------+----------+--------+ OUTFLOW VEIN   PSV (cm/s)Diameter (cm)Depth (cm)Describe +---------------+----------+-------------+----------+--------+ Subclavian vein   147                                    +---------------+----------+-------------+----------+--------+ Axillary vein      51                                    +---------------+----------+-------------+----------+--------+  Confluence        309                                    +---------------+----------+-------------+----------+--------+ Clavicle          267                                    +---------------+----------+-------------+----------+--------+ Shoulder          107                                    +---------------+----------+-------------+----------+--------+ Prox UA           135        1.68                        +---------------+----------+-------------+----------+--------+ Mid UA            129        1.81                        +---------------+----------+-------------+----------+--------+ Dist UA           135        1.28                        +---------------+----------+-------------+----------+--------+  +----------------+-------------+----------+---------+----------+---------------+                 Diameter (cm)Depth (cm)BranchingPSV (cm/s)  Flow Volume                                                                (ml/min)      +----------------+-------------+----------+---------+----------+---------------+ Left dis rad art                                    4                     +----------------+-------------+----------+---------+----------+---------------+ antegrade low                                                             flow                                                                      +----------------+-------------+----------+---------+----------+---------------+ Left subclav art    1.23                                                  +----------------+-------------+----------+---------+----------+---------------+  Summary: Patent left brachiocephalic AVF with no evidence of stenosis. Mostly aneurysmal throughout the AVF and moderately tortuous. The subclavian artery also appears dilated in the shoulder/proximal region.  *See table(s) above for measurements and observations.  Diagnosing physician: Hortencia Pilar MD Electronically signed by Hortencia Pilar MD on 09/14/2019 at 4:55:50 PM.   --------------------------------------------------------------------------------   Final       Discharge Exam: Vitals:   10/12/19 0458 10/12/19 0755  BP: (!) 147/77 140/72  Pulse: 74 71  Resp: 18 16  Temp: 98.1 F (36.7 C) 98.8 F (37.1 C)  SpO2: 95% 92%   Vitals:   10/11/19 1851 10/11/19 2010 10/12/19 0458 10/12/19 0755  BP: (!) 162/78 (!) 165/80 (!) 147/77 140/72  Pulse: 72 82 74 71  Resp: 16 18 18 16   Temp: 98.4 F (36.9 C) 98.2 F (36.8 C) 98.1 F (36.7 C) 98.8 F (37.1 C)  TempSrc: Oral  Oral Oral  SpO2: 100% 93% 95% 92%  Weight: 53.4 kg 53.4 kg    Height:        General: Pt is alert, awake, not in acute distress Cardiovascular: RRR, S1/S2 +, no rubs, no gallops Respiratory: CTA bilaterally, no wheezing, no rhonchi Abdominal: Soft, NT, ND, bowel sounds + Extremities: no edema, no cyanosis    The results of significant diagnostics from this hospitalization  (including imaging, microbiology, ancillary and laboratory) are listed below for reference.     Microbiology: Recent Results (from the past 240 hour(s))  SARS Coronavirus 2 by RT PCR (hospital order, performed in [facility] hospital lab) Nasopharyngeal Nasopharyngeal Swab     Status: None   Collection Time: 10/11/19  1:00 PM   Specimen: Nasopharyngeal Swab  Result Value Ref Range Status   SARS Coronavirus 2 NEGATIVE NEGATIVE Final    Comment: (NOTE) If result is NEGATIVE SARS-CoV-2 target nucleic acids are NOT DETECTED. The SARS-CoV-2 RNA is generally detectable in upper and lower  respiratory specimens during the acute phase of infection. The lowest  concentration of SARS-CoV-2 viral copies this assay can detect is 250  copies / mL. A negative result does not preclude SARS-CoV-2 infection  and should not be used as the sole basis for treatment or other  patient management decisions.  A negative result may occur with  improper specimen collection / handling, submission of specimen other  than nasopharyngeal swab, presence of viral mutation(s) within the  areas targeted by this assay, and inadequate number of viral copies  (<250 copies / mL). A negative result must be combined with clinical  observations, patient history, and epidemiological information. If result is POSITIVE SARS-CoV-2 target nucleic acids are DETECTED. The SARS-CoV-2 RNA is generally detectable in upper and lower  respiratory specimens dur ing the acute phase of infection.  Positive  results are indicative of active infection with SARS-CoV-2.  Clinical  correlation with patient history and other diagnostic information is  necessary to determine patient infection status.  Positive results do  not rule out bacterial infection or co-infection with other viruses. If result is PRESUMPTIVE POSTIVE SARS-CoV-2 nucleic acids MAY BE PRESENT.   A presumptive positive result was obtained on the submitted specimen  and  confirmed on repeat testing.  While 2019 novel coronavirus  (SARS-CoV-2) nucleic acids may be present in the submitted sample  additional confirmatory testing may be necessary for epidemiological  and / or clinical management purposes  to differentiate between  SARS-CoV-2 and other Sarbecovirus currently known to infect humans.  If clinically indicated additional testing with  an alternate test  methodology [phone]) is advised. The SARS-CoV-2 RNA is generally  detectable in upper and lower respiratory sp ecimens during the acute  phase of infection. The expected result is Negative. Fact Sheet for Patients:  StrictlyIdeas.no Fact Sheet for Healthcare Providers: BankingDealers.co.za This test is not yet approved or cleared by the [place] FDA and has been authorized for detection and/or diagnosis of SARS-CoV-2 by FDA under an Emergency Use Authorization (EUA).  This EUA will remain in effect (meaning this test can be used) for the duration of the COVID-19 declaration under Section 564(b)(1) of the Act, 21 U.S.C. section 360bbb-3(b)(1), unless the authorization is terminated or revoked sooner. Performed at [facility], [place] [address], [place], [place] [address]   MRSA PCR Screening     Status: None   Collection Time: 10/11/19 10:06 PM   Specimen: Nasal Mucosa; Nasopharyngeal  Result Value Ref Range Status   MRSA by PCR NEGATIVE NEGATIVE Final    Comment:        The GeneXpert MRSA Assay (FDA approved for NASAL specimens only), is one component of a comprehensive MRSA colonization surveillance program. It is not intended to diagnose MRSA infection nor to guide or monitor treatment for MRSA infections. Performed at [facility], [place] [address] [place], [place] [address]      Labs: BNP (last 3 results) Recent Labs    12/13/18 1141  BNP 5,397.6*   Basic Metabolic Panel: Recent Labs  Lab 10/11/19 1111  10/12/19 0506  NA 142 137  K 3.7 3.5  CL 98 97*  CO2 28 27  GLUCOSE 97 88  BUN 41* 16  CREATININE 9.78* 5.50*  CALCIUM 9.7 9.6   Liver Function Tests: Recent Labs  Lab 10/11/19 1111  AST 23  ALT 12  ALKPHOS 84  BILITOT 1.0  PROT 6.7  ALBUMIN 3.5   No results for input(s): LIPASE, AMYLASE in the last 168 hours. No results for input(s): AMMONIA in the last 168 hours. CBC: Recent Labs  Lab 10/11/19 1111 10/12/19 0506  WBC 5.1 4.7  NEUTROABS 3.6  --   HGB 9.0* 8.8*  HCT 28.3* 27.3*  MCV 94.6 93.5  PLT 222 236   Cardiac Enzymes: No results for input(s): CKTOTAL, CKMB, CKMBINDEX, TROPONINI in the last 168 hours. BNP: Invalid input(s): POCBNP CBG: No results for input(s): GLUCAP in the last 168 hours. D-Dimer No results for input(s): DDIMER in the last 72 hours. Hgb A1c No results for input(s): HGBA1C in the last 72 hours. Lipid Profile No results for input(s): CHOL, HDL, LDLCALC, TRIG, CHOLHDL, LDLDIRECT in the last 72 hours. Thyroid function studies No results for input(s): TSH, T4TOTAL, T3FREE, THYROIDAB in the last 72 hours.  Invalid input(s): FREET3 Anemia work up No results for input(s): VITAMINB12, FOLATE, FERRITIN, TIBC, IRON, RETICCTPCT in the last 72 hours. Urinalysis    Component Value Date/Time   COLORURINE YELLOW 01/30/2011 1648   APPEARANCEUR CLOUDY (A) 01/30/2011 1648   LABSPEC 1.016 01/30/2011 1648   PHURINE 5.0 01/30/2011 1648   GLUCOSEU NEGATIVE 10/14/2009 0747   HGBUR SMALL (A) 01/30/2011 1648   BILIRUBINUR NEGATIVE 01/30/2011 1648   KETONESUR NEGATIVE 01/30/2011 1648   PROTEINUR 100 (A) 01/30/2011 1648   UROBILINOGEN 0.2 01/30/2011 1648   NITRITE NEGATIVE 01/30/2011 1648   LEUKOCYTESUR SMALL (A) 01/30/2011 1648   Sepsis Labs Invalid input(s): PROCALCITONIN,  WBC,  LACTICIDVEN Microbiology Recent Results (from the past 240 hour(s))  SARS Coronavirus 2 by RT PCR (hospital  order, performed in [facility] hospital lab) Nasopharyngeal  Nasopharyngeal Swab     Status: None   Collection Time: 10/11/19  1:00 PM   Specimen: Nasopharyngeal Swab  Result Value Ref Range Status   SARS Coronavirus 2 NEGATIVE NEGATIVE Final    Comment: (NOTE) If result is NEGATIVE SARS-CoV-2 target nucleic acids are NOT DETECTED. The SARS-CoV-2 RNA is generally detectable in upper and lower  respiratory specimens during the acute phase of infection. The lowest  concentration of SARS-CoV-2 viral copies this assay can detect is 250  copies / mL. A negative result does not preclude SARS-CoV-2 infection  and should not be used as the sole basis for treatment or other  patient management decisions.  A negative result may occur with  improper specimen collection / handling, submission of specimen other  than nasopharyngeal swab, presence of viral mutation(s) within the  areas targeted by this assay, and inadequate number of viral copies  (<250 copies / mL). A negative result must be combined with clinical  observations, patient history, and epidemiological information. If result is POSITIVE SARS-CoV-2 target nucleic acids are DETECTED. The SARS-CoV-2 RNA is generally detectable in upper and lower  respiratory specimens dur ing the acute phase of infection.  Positive  results are indicative of active infection with SARS-CoV-2.  Clinical  correlation with patient history and other diagnostic information is  necessary to determine patient infection status.  Positive results do  not rule out bacterial infection or co-infection with other viruses. If result is PRESUMPTIVE POSTIVE SARS-CoV-2 nucleic acids MAY BE PRESENT.   A presumptive positive result was obtained on the submitted specimen  and confirmed on repeat testing.  While 2019 novel coronavirus  (SARS-CoV-2) nucleic acids may be present in the submitted sample  additional confirmatory testing may be necessary for epidemiological  and / or clinical management purposes  to differentiate between   SARS-CoV-2 and other Sarbecovirus currently known to infect humans.  If clinically indicated additional testing with an alternate test  methodology [phone]) is advised. The SARS-CoV-2 RNA is generally  detectable in upper and lower respiratory sp ecimens during the acute  phase of infection. The expected result is Negative. Fact Sheet for Patients:  StrictlyIdeas.no Fact Sheet for Healthcare Providers: BankingDealers.co.za This test is not yet approved or cleared by the [place] FDA and has been authorized for detection and/or diagnosis of SARS-CoV-2 by FDA under an Emergency Use Authorization (EUA).  This EUA will remain in effect (meaning this test can be used) for the duration of the COVID-19 declaration under Section 564(b)(1) of the Act, 21 U.S.C. section 360bbb-3(b)(1), unless the authorization is terminated or revoked sooner. Performed at [facility], [place] [address], [place], [place] [address]   MRSA PCR Screening     Status: None   Collection Time: 10/11/19 10:06 PM   Specimen: Nasal Mucosa; Nasopharyngeal  Result Value Ref Range Status   MRSA by PCR NEGATIVE NEGATIVE Final    Comment:        The GeneXpert MRSA Assay (FDA approved for NASAL specimens only), is one component of a comprehensive MRSA colonization surveillance program. It is not intended to diagnose MRSA infection nor to guide or monitor treatment for MRSA infections. Performed at [facility], [place] [address] [place], [place] [address]      Time coordinating discharge: Over 30 minutes  SIGNED:   Darliss Cheney, MD  [facility] 10/12/2019, 10:27 AM  If 7PM-7AM, please contact night-coverage [email or web address] Password [email or web address]

## 2019-10-12 NOTE — Evaluation (Signed)
Physical Therapy Evaluation Patient Details Name: [name] MRN: [id number] DOB: 1950/12/17 Today's Date: 10/12/2019   History of Present Illness  68 yo female admitted to ED on 10/28 with weakness x1 week, missed HD appointment on 10/28 due to this. PMH includes HF,TIA and CVA, HTN, GIB, ESRD on HD MWF, CAD, depression/anxiety, osteoporosis, neuropathy hands/feet, vestibular neuritis, cervical fusion.  Clinical Impression   Pt presents with generalized weakness, difficulty performing bed mobility, increased time and effort to perform mobility tasks, decreased activity tolerance requiring multiple rest breaks during mobility. Pt to benefit from acute PT to address deficits. Pt ambulated short room distance with RW with min guard assist, verbal cuing for safe use of RW and pt taking x3 rest breaks during mobility to sink due to fatigue. PT and OT educated pt on energy conservation, having a ride to HD if having a particularly fatiguing day, and asking for assist from sister as needed. Pt would benefit from HHPT to continue to reinforce pt education, as well as work on pt's tolerance for activity and LE strength. PT also recommending rollator for pt, as pt fatigues easily and having a mobile seat as well as a supportive AD would be beneficial. RN notified of these recommendations, pt agrees. PT to progress mobility as tolerated, and will continue to follow acutely.      Follow Up Recommendations Home health PT;Supervision for mobility/OOB    Equipment Recommendations  Other (comment)(4WW/rollator)    Recommendations for Other Services       Precautions / Restrictions Precautions Precautions: Fall Restrictions Weight Bearing Restrictions: No      Mobility  Bed Mobility Overal bed mobility: Needs Assistance Bed Mobility: Supine to Sit     Supine to sit: Mod assist;HOB elevated     General bed mobility comments: Mod assist for trunk elevation off of bed, verbal cuing for self  assist with bed rails.  Transfers Overall transfer level: Needs assistance Equipment used: Rolling walker (2 wheeled) Transfers: Sit to/from Stand Sit to Stand: Min guard         General transfer comment: Min guard for safety, verbal cuing for safe hand placement on first to stands, able to properly place hands on support surface to rise on 3rd attempt. Sit to stand x3 during session due to pt fatigue with activity.  Ambulation/Gait Ambulation/Gait assistance: Min guard;+2 safety/equipment(chair follow) Gait Distance (Feet): 10 Feet Assistive device: Rolling walker (2 wheeled) Gait Pattern/deviations: Step-through pattern;Decreased stride length;Trunk flexed;Drifts right/left Gait velocity: decr   General Gait Details: Min guard for safety, frequent verbal cuing for safe placement in RW and upright posture. Post-ambulation, pt needed immediate seated rest with uncontrolled descent due to fatiguing.  Stairs            Wheelchair Mobility    Modified Rankin (Stroke Patients Only)       Balance Overall balance assessment: History of Falls;Needs assistance(pt reports falling out of bed multiple times PTA, but states no falls recently) Sitting-balance support: No upper extremity supported;Feet supported Sitting balance-Leahy Scale: Fair Sitting balance - Comments: able to sit EOB unsupported   Standing balance support: Bilateral upper extremity supported Standing balance-Leahy Scale: Poor Standing balance comment: reliant on external support in standing; pt stood at sink x2 minutes for teeth brushing, requires tripod positioning of UEs for energy conservation and support                             Pertinent  Vitals/Pain Pain Assessment: No/denies pain    Home Living Family/patient expects to be discharged to:: Private residence Living Arrangements: Parent;Other (Comment)(mother with dementia) Available Help at Discharge: Family;Other (Comment)(sister there  frequently) Type of Home: House Home Access: Stairs to enter   [employer] of Steps: one step down to sunroom Home Layout: One level Home Equipment: Shower seat;Cane - single point;Walker - 2 wheels      Prior Function Level of Independence: Independent         Comments: reports driving self to dialysis apts; mostly reports BADL increasing in difficulty 2/2 fatigue. Does not cook, eats out a lot     Hand Dominance   Dominant Hand: Right    Extremity/Trunk Assessment   Upper Extremity Assessment Upper Extremity Assessment: Defer to OT evaluation    Lower Extremity Assessment Lower Extremity Assessment: Generalized weakness    Cervical / Trunk Assessment Cervical / Trunk Assessment: Normal  Communication   Communication: No difficulties  Cognition Arousal/Alertness: Awake/alert Behavior During Therapy: WFL for tasks assessed/performed Overall Cognitive Status: Within Functional Limits for tasks assessed                                 General Comments: pt very pleasant and talkative      General Comments      Exercises     Assessment/Plan    PT Assessment Patient needs continued PT services  PT Problem List Decreased strength;Decreased mobility;Decreased activity tolerance;Decreased balance;Decreased knowledge of use of DME;Decreased safety awareness       PT Treatment Interventions DME instruction;Therapeutic activities;Gait training;Therapeutic exercise;Patient/family education;Balance training;Functional mobility training;Stair training    PT Goals (Current goals can be found in the Care Plan section)  Acute Rehab PT Goals Patient Stated Goal: get my endurance up, get a rollator PT Goal Formulation: With patient Time For Goal Achievement: 10/26/19 Potential to Achieve Goals: Good    Frequency Min 3X/week   Barriers to discharge        Co-evaluation PT/OT/SLP Co-Evaluation/Treatment: Yes Reason for Co-Treatment: For  patient/therapist safety;To address functional/ADL transfers PT goals addressed during session: Mobility/safety with mobility         AM-PAC PT "6 Clicks" Mobility  Outcome Measure Help needed turning from your back to your side while in a flat bed without using bedrails?: A Little Help needed moving from lying on your back to sitting on the side of a flat bed without using bedrails?: A Lot Help needed moving to and from a bed to a chair (including a wheelchair)?: A Little Help needed standing up from a chair using your arms (e.g., wheelchair or bedside chair)?: A Little Help needed to walk in hospital room?: A Little Help needed climbing 3-5 steps with a railing? : A Little 6 Click Score: 17    End of Session Equipment Utilized During Treatment: Gait belt Activity Tolerance: Patient tolerated treatment well;Patient limited by pain Patient left: in chair;with call bell/phone within reach(pt verbally agrees to press call button and wait for assist prior to mobilizing back to bed) Nurse Communication: Mobility status PT Visit Diagnosis: Muscle weakness (generalized) (M62.81);Difficulty in walking, not elsewhere classified (R26.2)    Time: 2841-3244 PT Time Calculation (min) (ACUTE ONLY): 28 min   Charges:   PT Evaluation $PT Eval Low Complexity: 1 Low          Joslyn Ramos Terrial Rhodes, PT Acute Rehabilitation Services Pager [phone]  Office [phone]   Prather Failla  D Alantra Popoca 10/12/2019, 12:40 PM

## 2019-10-12 NOTE — Discharge Instructions (Signed)
Chronic Kidney Disease, Adult °Chronic kidney disease (CKD) happens when the kidneys are damaged over a long period of time. The kidneys are two organs that help with: °· Getting rid of waste and extra fluid from the blood. °· Making hormones that maintain the amount of fluid in your tissues and blood vessels. °· Making sure that the body has the right amount of fluids and chemicals. °Most of the time, CKD does not go away, but it can usually be controlled. Steps must be taken to slow down the kidney damage or to stop it from getting worse. If this is not done, the kidneys may stop working. °Follow these instructions at home: °Medicines °· Take over-the-counter and prescription medicines only as told by your doctor. You may need to change the amount of medicines you take. °· Do not take any new medicines unless your doctor says it is okay. Many medicines can make your kidney damage worse. °· Do not take any vitamin and supplements unless your doctor says it is okay. Many vitamins and supplements can make your kidney damage worse. °General instructions °· Follow a diet as told by your doctor. You may need to stay away from: °? Alcohol. °? Salty foods. °? Foods that are high in: °§ Potassium. °§ Calcium. °§ Protein. °· Do not use any products that contain nicotine or tobacco, such as cigarettes and e-cigarettes. If you need help quitting, ask your doctor. °· Keep track of your blood pressure at home. Tell your doctor about any changes. °· If you have diabetes, keep track of your blood sugar as told by your doctor. °· Try to stay at a healthy weight. If you need help, ask your doctor. °· Exercise at least 30 minutes a day, 5 days a week. °· Stay up-to-date with your shots (immunizations) as told by your doctor. °· Keep all follow-up visits as told by your doctor. This is important. °Contact a doctor if: °· Your symptoms get worse. °· You have new symptoms. °Get help right away if: °· You have symptoms of end-stage  kidney disease. These may include: °? Headaches. °? Numbness in your hands or feet. °? Easy bruising. °? Having hiccups often. °? Chest pain. °? Shortness of breath. °? Stopping of menstrual periods in women. °· You have a fever. °· You have very little pee (urine). °· You have pain or bleeding when you pee. °Summary °· Chronic kidney disease (CKD) happens when the kidneys are damaged over a long period of time. °· Most of the time, this condition does not go away, but it can usually be controlled. Steps must be taken to slow down the kidney damage or to stop it from getting worse. °· Treatment may include a combination of medicines and lifestyle changes. °This information is not intended to replace advice given to you by your health care provider. Make sure you discuss any questions you have with your health care provider. °Document Released: 02/24/2010 Document Revised: 11/12/2017 Document Reviewed: 01/04/2017 °Elsevier Patient Education © 2020 [employer] ° °

## 2019-10-13 ENCOUNTER — Inpatient Hospital Stay (HOSPITAL_COMMUNITY)
Admission: RE | Admit: 2019-10-13 | Discharge: 2019-10-13 | Disposition: A | Discharge status: Home / Self Care | Payer: Medicare Other | Source: Ambulatory Visit | Attending: Nephrology | Admitting: Nephrology

## 2019-10-13 DIAGNOSIS — N2581 Secondary hyperparathyroidism of renal origin: Secondary | ICD-10-CM | POA: Diagnosis not present

## 2019-10-13 DIAGNOSIS — J681 Pulmonary edema due to chemicals, gases, fumes and vapors: Secondary | ICD-10-CM | POA: Diagnosis not present

## 2019-10-13 DIAGNOSIS — Z992 Dependence on renal dialysis: Secondary | ICD-10-CM | POA: Diagnosis not present

## 2019-10-13 DIAGNOSIS — N186 End stage renal disease: Secondary | ICD-10-CM | POA: Diagnosis not present

## 2019-10-13 DIAGNOSIS — D631 Anemia in chronic kidney disease: Secondary | ICD-10-CM | POA: Diagnosis not present

## 2019-10-13 DIAGNOSIS — E8770 Fluid overload, unspecified: Secondary | ICD-10-CM | POA: Diagnosis not present

## 2019-10-13 DIAGNOSIS — D509 Iron deficiency anemia, unspecified: Secondary | ICD-10-CM | POA: Diagnosis not present

## 2019-10-13 LAB — TYPE AND SCREEN
ABO/RH(D): O POS
Antibody Screen: NEGATIVE
Unit division: 0
Unit division: 0

## 2019-10-13 LAB — BPAM RBC
Blood Product Expiration Date: 202012022359
Blood Product Expiration Date: 202012022359
ISSUE DATE / TIME: 202010272139
ISSUE DATE / TIME: 202010272139
Unit Type and Rh: 5100
Unit Type and Rh: 5100

## 2019-10-15 DIAGNOSIS — Z992 Dependence on renal dialysis: Secondary | ICD-10-CM | POA: Diagnosis not present

## 2019-10-15 DIAGNOSIS — T8612 Kidney transplant failure: Secondary | ICD-10-CM | POA: Diagnosis not present

## 2019-10-15 DIAGNOSIS — N186 End stage renal disease: Secondary | ICD-10-CM | POA: Diagnosis not present

## 2019-10-16 DIAGNOSIS — D631 Anemia in chronic kidney disease: Secondary | ICD-10-CM | POA: Diagnosis not present

## 2019-10-16 DIAGNOSIS — N186 End stage renal disease: Secondary | ICD-10-CM | POA: Diagnosis not present

## 2019-10-16 DIAGNOSIS — N2581 Secondary hyperparathyroidism of renal origin: Secondary | ICD-10-CM | POA: Diagnosis not present

## 2019-10-16 DIAGNOSIS — Z992 Dependence on renal dialysis: Secondary | ICD-10-CM | POA: Diagnosis not present

## 2019-10-16 DIAGNOSIS — D509 Iron deficiency anemia, unspecified: Secondary | ICD-10-CM | POA: Diagnosis not present

## 2019-10-17 DIAGNOSIS — D631 Anemia in chronic kidney disease: Secondary | ICD-10-CM | POA: Diagnosis not present

## 2019-10-17 DIAGNOSIS — N186 End stage renal disease: Secondary | ICD-10-CM | POA: Diagnosis not present

## 2019-10-17 DIAGNOSIS — Z992 Dependence on renal dialysis: Secondary | ICD-10-CM | POA: Diagnosis not present

## 2019-10-17 DIAGNOSIS — I4891 Unspecified atrial fibrillation: Secondary | ICD-10-CM | POA: Diagnosis not present

## 2019-10-17 DIAGNOSIS — I120 Hypertensive chronic kidney disease with stage 5 chronic kidney disease or end stage renal disease: Secondary | ICD-10-CM | POA: Diagnosis not present

## 2019-10-17 DIAGNOSIS — E785 Hyperlipidemia, unspecified: Secondary | ICD-10-CM | POA: Diagnosis not present

## 2019-10-17 DIAGNOSIS — M6281 Muscle weakness (generalized): Secondary | ICD-10-CM | POA: Diagnosis not present

## 2019-10-18 DIAGNOSIS — D509 Iron deficiency anemia, unspecified: Secondary | ICD-10-CM | POA: Diagnosis not present

## 2019-10-18 DIAGNOSIS — N186 End stage renal disease: Secondary | ICD-10-CM | POA: Diagnosis not present

## 2019-10-18 DIAGNOSIS — D631 Anemia in chronic kidney disease: Secondary | ICD-10-CM | POA: Diagnosis not present

## 2019-10-18 DIAGNOSIS — Z992 Dependence on renal dialysis: Secondary | ICD-10-CM | POA: Diagnosis not present

## 2019-10-18 DIAGNOSIS — N2581 Secondary hyperparathyroidism of renal origin: Secondary | ICD-10-CM | POA: Diagnosis not present

## 2019-10-19 DIAGNOSIS — E785 Hyperlipidemia, unspecified: Secondary | ICD-10-CM | POA: Diagnosis not present

## 2019-10-19 DIAGNOSIS — M6281 Muscle weakness (generalized): Secondary | ICD-10-CM | POA: Diagnosis not present

## 2019-10-19 DIAGNOSIS — N186 End stage renal disease: Secondary | ICD-10-CM | POA: Diagnosis not present

## 2019-10-19 DIAGNOSIS — D631 Anemia in chronic kidney disease: Secondary | ICD-10-CM | POA: Diagnosis not present

## 2019-10-19 DIAGNOSIS — I120 Hypertensive chronic kidney disease with stage 5 chronic kidney disease or end stage renal disease: Secondary | ICD-10-CM | POA: Diagnosis not present

## 2019-10-19 DIAGNOSIS — I4891 Unspecified atrial fibrillation: Secondary | ICD-10-CM | POA: Diagnosis not present

## 2019-10-20 DIAGNOSIS — D631 Anemia in chronic kidney disease: Secondary | ICD-10-CM | POA: Diagnosis not present

## 2019-10-20 DIAGNOSIS — D509 Iron deficiency anemia, unspecified: Secondary | ICD-10-CM | POA: Diagnosis not present

## 2019-10-20 DIAGNOSIS — N186 End stage renal disease: Secondary | ICD-10-CM | POA: Diagnosis not present

## 2019-10-20 DIAGNOSIS — N2581 Secondary hyperparathyroidism of renal origin: Secondary | ICD-10-CM | POA: Diagnosis not present

## 2019-10-20 DIAGNOSIS — Z992 Dependence on renal dialysis: Secondary | ICD-10-CM | POA: Diagnosis not present

## 2019-10-23 DIAGNOSIS — Z992 Dependence on renal dialysis: Secondary | ICD-10-CM | POA: Diagnosis not present

## 2019-10-23 DIAGNOSIS — N186 End stage renal disease: Secondary | ICD-10-CM | POA: Diagnosis not present

## 2019-10-23 DIAGNOSIS — D509 Iron deficiency anemia, unspecified: Secondary | ICD-10-CM | POA: Diagnosis not present

## 2019-10-23 DIAGNOSIS — D631 Anemia in chronic kidney disease: Secondary | ICD-10-CM | POA: Diagnosis not present

## 2019-10-23 DIAGNOSIS — N2581 Secondary hyperparathyroidism of renal origin: Secondary | ICD-10-CM | POA: Diagnosis not present

## 2019-10-24 DIAGNOSIS — I4891 Unspecified atrial fibrillation: Secondary | ICD-10-CM | POA: Diagnosis not present

## 2019-10-24 DIAGNOSIS — I120 Hypertensive chronic kidney disease with stage 5 chronic kidney disease or end stage renal disease: Secondary | ICD-10-CM | POA: Diagnosis not present

## 2019-10-24 DIAGNOSIS — D631 Anemia in chronic kidney disease: Secondary | ICD-10-CM | POA: Diagnosis not present

## 2019-10-24 DIAGNOSIS — E785 Hyperlipidemia, unspecified: Secondary | ICD-10-CM | POA: Diagnosis not present

## 2019-10-24 DIAGNOSIS — M6281 Muscle weakness (generalized): Secondary | ICD-10-CM | POA: Diagnosis not present

## 2019-10-24 DIAGNOSIS — N186 End stage renal disease: Secondary | ICD-10-CM | POA: Diagnosis not present

## 2019-10-25 DIAGNOSIS — D631 Anemia in chronic kidney disease: Secondary | ICD-10-CM | POA: Diagnosis not present

## 2019-10-25 DIAGNOSIS — N2581 Secondary hyperparathyroidism of renal origin: Secondary | ICD-10-CM | POA: Diagnosis not present

## 2019-10-25 DIAGNOSIS — Z992 Dependence on renal dialysis: Secondary | ICD-10-CM | POA: Diagnosis not present

## 2019-10-25 DIAGNOSIS — N186 End stage renal disease: Secondary | ICD-10-CM | POA: Diagnosis not present

## 2019-10-25 DIAGNOSIS — D509 Iron deficiency anemia, unspecified: Secondary | ICD-10-CM | POA: Diagnosis not present

## 2019-10-26 DIAGNOSIS — M6281 Muscle weakness (generalized): Secondary | ICD-10-CM | POA: Diagnosis not present

## 2019-10-26 DIAGNOSIS — D631 Anemia in chronic kidney disease: Secondary | ICD-10-CM | POA: Diagnosis not present

## 2019-10-26 DIAGNOSIS — I4891 Unspecified atrial fibrillation: Secondary | ICD-10-CM | POA: Diagnosis not present

## 2019-10-26 DIAGNOSIS — E785 Hyperlipidemia, unspecified: Secondary | ICD-10-CM | POA: Diagnosis not present

## 2019-10-26 DIAGNOSIS — I120 Hypertensive chronic kidney disease with stage 5 chronic kidney disease or end stage renal disease: Secondary | ICD-10-CM | POA: Diagnosis not present

## 2019-10-26 DIAGNOSIS — N186 End stage renal disease: Secondary | ICD-10-CM | POA: Diagnosis not present

## 2019-10-27 DIAGNOSIS — D631 Anemia in chronic kidney disease: Secondary | ICD-10-CM | POA: Diagnosis not present

## 2019-10-27 DIAGNOSIS — D509 Iron deficiency anemia, unspecified: Secondary | ICD-10-CM | POA: Diagnosis not present

## 2019-10-27 DIAGNOSIS — N186 End stage renal disease: Secondary | ICD-10-CM | POA: Diagnosis not present

## 2019-10-27 DIAGNOSIS — N2581 Secondary hyperparathyroidism of renal origin: Secondary | ICD-10-CM | POA: Diagnosis not present

## 2019-10-27 DIAGNOSIS — Z992 Dependence on renal dialysis: Secondary | ICD-10-CM | POA: Diagnosis not present

## 2019-10-30 DIAGNOSIS — D631 Anemia in chronic kidney disease: Secondary | ICD-10-CM | POA: Diagnosis not present

## 2019-10-30 DIAGNOSIS — Z992 Dependence on renal dialysis: Secondary | ICD-10-CM | POA: Diagnosis not present

## 2019-10-30 DIAGNOSIS — D509 Iron deficiency anemia, unspecified: Secondary | ICD-10-CM | POA: Diagnosis not present

## 2019-10-30 DIAGNOSIS — N186 End stage renal disease: Secondary | ICD-10-CM | POA: Diagnosis not present

## 2019-10-30 DIAGNOSIS — N2581 Secondary hyperparathyroidism of renal origin: Secondary | ICD-10-CM | POA: Diagnosis not present

## 2019-10-31 DIAGNOSIS — E785 Hyperlipidemia, unspecified: Secondary | ICD-10-CM | POA: Diagnosis not present

## 2019-10-31 DIAGNOSIS — N186 End stage renal disease: Secondary | ICD-10-CM | POA: Diagnosis not present

## 2019-10-31 DIAGNOSIS — D631 Anemia in chronic kidney disease: Secondary | ICD-10-CM | POA: Diagnosis not present

## 2019-10-31 DIAGNOSIS — I120 Hypertensive chronic kidney disease with stage 5 chronic kidney disease or end stage renal disease: Secondary | ICD-10-CM | POA: Diagnosis not present

## 2019-10-31 DIAGNOSIS — M6281 Muscle weakness (generalized): Secondary | ICD-10-CM | POA: Diagnosis not present

## 2019-10-31 DIAGNOSIS — I4891 Unspecified atrial fibrillation: Secondary | ICD-10-CM | POA: Diagnosis not present

## 2019-11-01 DIAGNOSIS — D509 Iron deficiency anemia, unspecified: Secondary | ICD-10-CM | POA: Diagnosis not present

## 2019-11-01 DIAGNOSIS — N2581 Secondary hyperparathyroidism of renal origin: Secondary | ICD-10-CM | POA: Diagnosis not present

## 2019-11-01 DIAGNOSIS — N186 End stage renal disease: Secondary | ICD-10-CM | POA: Diagnosis not present

## 2019-11-01 DIAGNOSIS — Z992 Dependence on renal dialysis: Secondary | ICD-10-CM | POA: Diagnosis not present

## 2019-11-01 DIAGNOSIS — D631 Anemia in chronic kidney disease: Secondary | ICD-10-CM | POA: Diagnosis not present

## 2019-11-02 DIAGNOSIS — E785 Hyperlipidemia, unspecified: Secondary | ICD-10-CM | POA: Diagnosis not present

## 2019-11-02 DIAGNOSIS — D631 Anemia in chronic kidney disease: Secondary | ICD-10-CM | POA: Diagnosis not present

## 2019-11-02 DIAGNOSIS — N186 End stage renal disease: Secondary | ICD-10-CM | POA: Diagnosis not present

## 2019-11-02 DIAGNOSIS — I120 Hypertensive chronic kidney disease with stage 5 chronic kidney disease or end stage renal disease: Secondary | ICD-10-CM | POA: Diagnosis not present

## 2019-11-02 DIAGNOSIS — M6281 Muscle weakness (generalized): Secondary | ICD-10-CM | POA: Diagnosis not present

## 2019-11-02 DIAGNOSIS — I4891 Unspecified atrial fibrillation: Secondary | ICD-10-CM | POA: Diagnosis not present

## 2019-11-03 DIAGNOSIS — D509 Iron deficiency anemia, unspecified: Secondary | ICD-10-CM | POA: Diagnosis not present

## 2019-11-03 DIAGNOSIS — D631 Anemia in chronic kidney disease: Secondary | ICD-10-CM | POA: Diagnosis not present

## 2019-11-03 DIAGNOSIS — N2581 Secondary hyperparathyroidism of renal origin: Secondary | ICD-10-CM | POA: Diagnosis not present

## 2019-11-03 DIAGNOSIS — Z992 Dependence on renal dialysis: Secondary | ICD-10-CM | POA: Diagnosis not present

## 2019-11-03 DIAGNOSIS — N186 End stage renal disease: Secondary | ICD-10-CM | POA: Diagnosis not present

## 2019-11-05 DIAGNOSIS — N2581 Secondary hyperparathyroidism of renal origin: Secondary | ICD-10-CM | POA: Diagnosis not present

## 2019-11-05 DIAGNOSIS — Z992 Dependence on renal dialysis: Secondary | ICD-10-CM | POA: Diagnosis not present

## 2019-11-05 DIAGNOSIS — D509 Iron deficiency anemia, unspecified: Secondary | ICD-10-CM | POA: Diagnosis not present

## 2019-11-05 DIAGNOSIS — N186 End stage renal disease: Secondary | ICD-10-CM | POA: Diagnosis not present

## 2019-11-05 DIAGNOSIS — D631 Anemia in chronic kidney disease: Secondary | ICD-10-CM | POA: Diagnosis not present

## 2019-11-07 DIAGNOSIS — E785 Hyperlipidemia, unspecified: Secondary | ICD-10-CM | POA: Diagnosis not present

## 2019-11-07 DIAGNOSIS — I4891 Unspecified atrial fibrillation: Secondary | ICD-10-CM | POA: Diagnosis not present

## 2019-11-07 DIAGNOSIS — M6281 Muscle weakness (generalized): Secondary | ICD-10-CM | POA: Diagnosis not present

## 2019-11-07 DIAGNOSIS — N2581 Secondary hyperparathyroidism of renal origin: Secondary | ICD-10-CM | POA: Diagnosis not present

## 2019-11-07 DIAGNOSIS — Z992 Dependence on renal dialysis: Secondary | ICD-10-CM | POA: Diagnosis not present

## 2019-11-07 DIAGNOSIS — D509 Iron deficiency anemia, unspecified: Secondary | ICD-10-CM | POA: Diagnosis not present

## 2019-11-07 DIAGNOSIS — N186 End stage renal disease: Secondary | ICD-10-CM | POA: Diagnosis not present

## 2019-11-07 DIAGNOSIS — D631 Anemia in chronic kidney disease: Secondary | ICD-10-CM | POA: Diagnosis not present

## 2019-11-07 DIAGNOSIS — I120 Hypertensive chronic kidney disease with stage 5 chronic kidney disease or end stage renal disease: Secondary | ICD-10-CM | POA: Diagnosis not present

## 2019-11-08 DIAGNOSIS — D631 Anemia in chronic kidney disease: Secondary | ICD-10-CM | POA: Diagnosis not present

## 2019-11-08 DIAGNOSIS — N186 End stage renal disease: Secondary | ICD-10-CM | POA: Diagnosis not present

## 2019-11-08 DIAGNOSIS — M6281 Muscle weakness (generalized): Secondary | ICD-10-CM | POA: Diagnosis not present

## 2019-11-08 DIAGNOSIS — I120 Hypertensive chronic kidney disease with stage 5 chronic kidney disease or end stage renal disease: Secondary | ICD-10-CM | POA: Diagnosis not present

## 2019-11-08 DIAGNOSIS — I4891 Unspecified atrial fibrillation: Secondary | ICD-10-CM | POA: Diagnosis not present

## 2019-11-08 DIAGNOSIS — E785 Hyperlipidemia, unspecified: Secondary | ICD-10-CM | POA: Diagnosis not present

## 2019-11-10 DIAGNOSIS — N2581 Secondary hyperparathyroidism of renal origin: Secondary | ICD-10-CM | POA: Diagnosis not present

## 2019-11-10 DIAGNOSIS — D509 Iron deficiency anemia, unspecified: Secondary | ICD-10-CM | POA: Diagnosis not present

## 2019-11-10 DIAGNOSIS — Z992 Dependence on renal dialysis: Secondary | ICD-10-CM | POA: Diagnosis not present

## 2019-11-10 DIAGNOSIS — D631 Anemia in chronic kidney disease: Secondary | ICD-10-CM | POA: Diagnosis not present

## 2019-11-10 DIAGNOSIS — N186 End stage renal disease: Secondary | ICD-10-CM | POA: Diagnosis not present

## 2019-11-13 DIAGNOSIS — N186 End stage renal disease: Secondary | ICD-10-CM | POA: Diagnosis not present

## 2019-11-13 DIAGNOSIS — D631 Anemia in chronic kidney disease: Secondary | ICD-10-CM | POA: Diagnosis not present

## 2019-11-13 DIAGNOSIS — N2581 Secondary hyperparathyroidism of renal origin: Secondary | ICD-10-CM | POA: Diagnosis not present

## 2019-11-13 DIAGNOSIS — Z992 Dependence on renal dialysis: Secondary | ICD-10-CM | POA: Diagnosis not present

## 2019-11-13 DIAGNOSIS — D509 Iron deficiency anemia, unspecified: Secondary | ICD-10-CM | POA: Diagnosis not present

## 2019-11-14 DIAGNOSIS — E785 Hyperlipidemia, unspecified: Secondary | ICD-10-CM | POA: Diagnosis not present

## 2019-11-14 DIAGNOSIS — M6281 Muscle weakness (generalized): Secondary | ICD-10-CM | POA: Diagnosis not present

## 2019-11-14 DIAGNOSIS — N186 End stage renal disease: Secondary | ICD-10-CM | POA: Diagnosis not present

## 2019-11-14 DIAGNOSIS — I120 Hypertensive chronic kidney disease with stage 5 chronic kidney disease or end stage renal disease: Secondary | ICD-10-CM | POA: Diagnosis not present

## 2019-11-14 DIAGNOSIS — D631 Anemia in chronic kidney disease: Secondary | ICD-10-CM | POA: Diagnosis not present

## 2019-11-14 DIAGNOSIS — I4891 Unspecified atrial fibrillation: Secondary | ICD-10-CM | POA: Diagnosis not present

## 2019-11-16 DIAGNOSIS — D631 Anemia in chronic kidney disease: Secondary | ICD-10-CM | POA: Diagnosis not present

## 2019-11-16 DIAGNOSIS — E785 Hyperlipidemia, unspecified: Secondary | ICD-10-CM | POA: Diagnosis not present

## 2019-11-16 DIAGNOSIS — I4891 Unspecified atrial fibrillation: Secondary | ICD-10-CM | POA: Diagnosis not present

## 2019-11-16 DIAGNOSIS — Z992 Dependence on renal dialysis: Secondary | ICD-10-CM | POA: Diagnosis not present

## 2019-11-16 DIAGNOSIS — N186 End stage renal disease: Secondary | ICD-10-CM | POA: Diagnosis not present

## 2019-11-16 DIAGNOSIS — M6281 Muscle weakness (generalized): Secondary | ICD-10-CM | POA: Diagnosis not present

## 2019-11-16 DIAGNOSIS — I120 Hypertensive chronic kidney disease with stage 5 chronic kidney disease or end stage renal disease: Secondary | ICD-10-CM | POA: Diagnosis not present

## 2019-11-20 ENCOUNTER — Non-Acute Institutional Stay (HOSPITAL_COMMUNITY)
Admission: EM | Admit: 2019-11-20 | Discharge: 2019-11-20 | Disposition: A | Discharge status: Home / Self Care | Payer: Medicare Other | Attending: Nephrology | Admitting: Nephrology

## 2019-11-20 ENCOUNTER — Emergency Department (HOSPITAL_COMMUNITY): Payer: Medicare Other

## 2019-11-20 DIAGNOSIS — D631 Anemia in chronic kidney disease: Secondary | ICD-10-CM | POA: Insufficient documentation

## 2019-11-20 DIAGNOSIS — I509 Heart failure, unspecified: Secondary | ICD-10-CM | POA: Insufficient documentation

## 2019-11-20 DIAGNOSIS — N186 End stage renal disease: Secondary | ICD-10-CM | POA: Insufficient documentation

## 2019-11-20 DIAGNOSIS — Z940 Kidney transplant status: Secondary | ICD-10-CM | POA: Diagnosis not present

## 2019-11-20 DIAGNOSIS — I2510 Atherosclerotic heart disease of native coronary artery without angina pectoris: Secondary | ICD-10-CM | POA: Diagnosis not present

## 2019-11-20 DIAGNOSIS — Z87891 Personal history of nicotine dependence: Secondary | ICD-10-CM | POA: Insufficient documentation

## 2019-11-20 DIAGNOSIS — Z79899 Other long term (current) drug therapy: Secondary | ICD-10-CM | POA: Insufficient documentation

## 2019-11-20 DIAGNOSIS — J9691 Respiratory failure, unspecified with hypoxia: Secondary | ICD-10-CM | POA: Insufficient documentation

## 2019-11-20 DIAGNOSIS — Z20828 Contact with and (suspected) exposure to other viral communicable diseases: Secondary | ICD-10-CM | POA: Diagnosis not present

## 2019-11-20 DIAGNOSIS — R609 Edema, unspecified: Secondary | ICD-10-CM | POA: Diagnosis not present

## 2019-11-20 DIAGNOSIS — R062 Wheezing: Secondary | ICD-10-CM | POA: Diagnosis not present

## 2019-11-20 DIAGNOSIS — E785 Hyperlipidemia, unspecified: Secondary | ICD-10-CM | POA: Diagnosis not present

## 2019-11-20 DIAGNOSIS — R0902 Hypoxemia: Secondary | ICD-10-CM | POA: Diagnosis not present

## 2019-11-20 DIAGNOSIS — I132 Hypertensive heart and chronic kidney disease with heart failure and with stage 5 chronic kidney disease, or end stage renal disease: Secondary | ICD-10-CM | POA: Insufficient documentation

## 2019-11-20 DIAGNOSIS — Z992 Dependence on renal dialysis: Secondary | ICD-10-CM | POA: Diagnosis not present

## 2019-11-20 DIAGNOSIS — R0602 Shortness of breath: Secondary | ICD-10-CM | POA: Diagnosis not present

## 2019-11-20 DIAGNOSIS — Z8673 Personal history of transient ischemic attack (TIA), and cerebral infarction without residual deficits: Secondary | ICD-10-CM | POA: Insufficient documentation

## 2019-11-20 DIAGNOSIS — I10 Essential (primary) hypertension: Secondary | ICD-10-CM | POA: Diagnosis not present

## 2019-11-20 DIAGNOSIS — J810 Acute pulmonary edema: Secondary | ICD-10-CM

## 2019-11-20 LAB — BASIC METABOLIC PANEL
Anion gap: 15 (ref 5–15)
BUN: 48 mg/dL — ABNORMAL HIGH (ref 8–23)
CO2: 33 mmol/L — ABNORMAL HIGH (ref 22–32)
Calcium: 9.9 mg/dL (ref 8.9–10.3)
Chloride: 94 mmol/L — ABNORMAL LOW (ref 98–111)
Creatinine, Ser: 8.59 mg/dL — ABNORMAL HIGH (ref 0.44–1.00)
GFR calc Af Amer: 5 mL/min — ABNORMAL LOW (ref >60)
GFR calc non Af Amer: 4 mL/min — ABNORMAL LOW (ref >60)
Glucose, Bld: 110 mg/dL — ABNORMAL HIGH (ref 70–99)
Potassium: 4.5 mmol/L (ref 3.5–5.1)
Sodium: 142 mmol/L (ref 135–145)

## 2019-11-20 LAB — CBC WITH DIFFERENTIAL/PLATELET
Abs Immature Granulocytes: 0.02 10*3/uL (ref 0.00–0.07)
Basophils Absolute: 0 10*3/uL (ref 0.0–0.1)
Basophils Relative: 0 %
Eosinophils Absolute: 0.1 10*3/uL (ref 0.0–0.5)
Eosinophils Relative: 2 %
HCT: 33.6 % — ABNORMAL LOW (ref 36.0–46.0)
Hemoglobin: 10.6 g/dL — ABNORMAL LOW (ref 12.0–15.0)
Immature Granulocytes: 0 %
Lymphocytes Relative: 16 %
Lymphs Abs: 0.9 10*3/uL (ref 0.7–4.0)
MCH: 29.2 pg (ref 26.0–34.0)
MCHC: 31.5 g/dL (ref 30.0–36.0)
MCV: 92.6 fL (ref 80.0–100.0)
Monocytes Absolute: 0.5 10*3/uL (ref 0.1–1.0)
Monocytes Relative: 8 %
Neutro Abs: 4.3 10*3/uL (ref 1.7–7.7)
Neutrophils Relative %: 74 %
Platelets: 182 10*3/uL (ref 150–400)
RBC: 3.63 MIL/uL — ABNORMAL LOW (ref 3.87–5.11)
RDW: 16.5 % — ABNORMAL HIGH (ref 11.5–15.5)
WBC: 5.9 10*3/uL (ref 4.0–10.5)
nRBC: 0 % (ref 0.0–0.2)

## 2019-11-20 LAB — SARS CORONAVIRUS 2 BY RT PCR (HOSPITAL ORDER, PERFORMED IN ~~LOC~~ HOSPITAL LAB): SARS Coronavirus 2: NEGATIVE

## 2019-11-20 LAB — D-DIMER, QUANTITATIVE: D-Dimer, Quant: 1.7 ug/mL-FEU — ABNORMAL HIGH (ref 0.00–0.50)

## 2019-11-20 LAB — TROPONIN I (HIGH SENSITIVITY)
Troponin I (High Sensitivity): 33 ng/L — ABNORMAL HIGH (ref <18)
Troponin I (High Sensitivity): 7 ng/L (ref <18)

## 2019-11-20 LAB — SARS CORONAVIRUS 2 (TAT 6-24 HRS): SARS Coronavirus 2: NEGATIVE

## 2019-11-20 LAB — BRAIN NATRIURETIC PEPTIDE: B Natriuretic Peptide: 3362.4 pg/mL — ABNORMAL HIGH (ref 0.0–100.0)

## 2019-11-20 MED ORDER — CHLORHEXIDINE GLUCONATE CLOTH 2 % EX PADS: 6.0000 | MEDICATED_PAD | Freq: Every day | CUTANEOUS | Status: DC

## 2019-11-20 MED ORDER — HEPARIN SODIUM (PORCINE) 1000 UNIT/ML DIALYSIS
3000.0000 [IU] | INTRAMUSCULAR | Status: DC | PRN
  Administered 2019-11-20: 13:00:00 3000 [IU] via INTRAVENOUS_CENTRAL
  Filled 2019-11-20: qty 3

## 2019-11-20 MED ORDER — HEPARIN SODIUM (PORCINE) 1000 UNIT/ML IJ SOLN
INTRAMUSCULAR | Status: AC
  Administered 2019-11-20: 3000 [IU] via INTRAVENOUS_CENTRAL
  Filled 2019-11-20: qty 3

## 2019-11-20 MED ORDER — IOHEXOL 350 MG/ML SOLN
75.0000 mL | Freq: Once | INTRAVENOUS | Status: AC | PRN
  Administered 2019-11-20: 75 mL via INTRAVENOUS

## 2019-11-20 NOTE — ED Notes (Signed)
Pt given dc instructions. Pt verbalizes understanding. Pt waiting on sister to pick her up

## 2019-11-20 NOTE — ED Notes (Signed)
White ambulating Pt, O2 dropped to 94. Pulse dropped to 79.

## 2019-11-20 NOTE — Procedures (Signed)
Pt seen in ED, not in distress.  Poss is losing body wt.  Plan HD tomorrow and if stable will be ok to dc home after HD.   I was present at this dialysis session, have reviewed the session itself and made  appropriate changes Kelly Splinter MD [place] pager [phone]   11/20/2019, 4:01 PM

## 2019-11-20 NOTE — Discharge Instructions (Signed)
Follow-up with dialysis center as per usual.  Return to the ED for worsening symptoms.

## 2019-11-20 NOTE — ED Provider Notes (Signed)
TIME SEEN: 3:42 AM  CHIEF COMPLAINT: SOB, hypoxia  HPI: Patient is a 68 year old female with history of end-stage renal disease on hemodialysis Monday, Wednesday and Friday, history of grade 2 diastolic dysfunction who presents to the emergency department shortness of breath that started tonight.  History of previous tobacco use but quit 30 years ago.  No history of asthma or COPD.  Has had dry cough but no fever.  No chest pain or chest discomfort.  No missed dialysis.  Last dialyzed December 4 and had a full session.  Does not wear oxygen at home.  Sats 79% on RA at home.  On 2 L [place] now.  Also hypertensive with EMS.  ROS: See HPI Constitutional: no fever  Eyes: no drainage  ENT: no runny nose   Cardiovascular:  no chest pain  Resp:  SOB  GI: no vomiting GU: no dysuria Integumentary: no rash  Allergy: no hives  Musculoskeletal: no leg swelling  Neurological: no slurred speech ROS otherwise negative  PAST MEDICAL HISTORY/PAST SURGICAL HISTORY:  Past Medical History:  Diagnosis Date  . Adenomatous polyp of colon 10/2010, 2006, 2015  . Anemia in CKD (chronic kidney disease) 11/07/2012   s/p blood transfusion.   . Arthritis   . CAD (coronary artery disease)    "something like that"  . CHF (congestive heart failure) ([place])   . Constipation   . Depression with anxiety   . Diverticula, colon   . 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  Had some early rejection then stable function for years, then had slow decline of function and went back on hemodialysis in 2012.  Gets HD TTS schedule at [facility] on [facility] still using L forearm AVF.     Marland Kitchen GERD (gastroesophageal reflux disease)   . GI bleed 2017   felt to be ischemic colitis, last colo 2015  . Headache   . Hyperlipidemia   . Hypertension   . Neurologic gait dysfunction   . Neuromuscular disorder (HCC)    neuropathy hand and legs  .  Osteoporosis   . Pneumonia   . 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 Stroke [facility]) 11/2015   TIA  . Weight loss, unintentional     MEDICATIONS:  Prior to Admission medications   Medication Sig Start Date End Date Taking? Authorizing Provider  acetaminophen (TYLENOL) 500 MG tablet Take 1 tablet (500 mg total) by mouth every 6 (six) hours as needed. Patient taking differently: Take 500 mg by mouth every 6 (six) hours as needed for mild pain.  09/05/18   Law, Bea Graff, PA-C  allopurinol (ZYLOPRIM) 300 MG tablet Take 150 mg by mouth at bedtime.    [provider]  amLODipine (NORVASC) 10 MG tablet Take 10 mg by mouth at bedtime.  07/19/19   [provider]  AURYXIA 1 GM 210 MG(Fe) tablet Take 420 mg by mouth 3 (three) times daily with meals.  07/20/19   [provider]  B Complex-C-Folic Acid (DIALYVITE 542) 0.8 MG WAFR Take 1 Wafer by mouth daily. 10/25/18   [provider]  labetalol (NORMODYNE) 200 MG tablet Take 200 mg by mouth 2 (two) times daily.     [provider]  lactulose (CHRONULAC) 10 GM/15ML solution Take 20 g by mouth daily as needed for mild constipation.    [provider]  losartan (COZAAR)  50 MG tablet Take 1 tablet (50 mg total) by mouth at bedtime. 12/14/18   Mariel Aloe, MD  ondansetron (ZOFRAN) 8 MG tablet Take 8 mg by mouth 2 (two) times daily as needed for nausea or vomiting.  01/20/18   [provider]  simvastatin (ZOCOR) 20 MG tablet Take 1 tablet (20 mg total) by mouth at bedtime. 02/26/17   Velvet Bathe, MD  zolpidem (AMBIEN) 10 MG tablet Take 10 mg by mouth at bedtime as needed for sleep.     [provider]    ALLERGIES:  Allergies  Allergen Reactions  . Sulfa Antibiotics Other (See Comments)    Both parents allergic-so will not take  . Adhesive [Tape] Itching    SOCIAL HISTORY:  Social History   Tobacco Use  . Smoking status: Former Smoker    Types:  Cigarettes    Quit date: 12/31/1991    Years since quitting: 27.9  . Smokeless tobacco: Never Used  Substance Use Topics  . Alcohol use: No    Alcohol/week: 0.0 standard drinks    FAMILY HISTORY: Family History  Problem Relation Age of Onset  . Colon cancer Brother   . Cancer Brother   . Coronary artery disease Mother 56  . Hyperlipidemia Mother   . Hypertension Mother   . Stroke Maternal Aunt   . Esophageal cancer Neg Hx   . Stomach cancer Neg Hx   . Rectal cancer Neg Hx     EXAM: BP (!) 187/100 (BP Location: Right Arm)   Pulse 94   Temp 98.3 F (36.8 C) (Oral)   Resp 18   Ht 5\' 3"  (1.6 m)   Wt 51.3 kg   SpO2 100%   BMI 20.02 kg/m  CONSTITUTIONAL: Alert and oriented and responds appropriately to questions.  Chronically ill-appearing HEAD: Normocephalic EYES: Conjunctivae clear, pupils appear equal, EOM appear intact ENT: normal nose; moist mucous membranes NECK: Supple, normal ROM CARD: RRR; S1 and S2 appreciated; no murmurs, no clicks, no rubs, no gallops RESP: Normal chest excursion without splinting or tachypnea; breath sounds equal bilaterally, no wheezing or rhonchi, bibasilar rales on exam, no hypoxia on nasal cannula or respiratory distress, speaking full sentences ABD/GI: Normal bowel sounds; non-distended; soft, non-tender, no rebound, no guarding, no peritoneal signs, no hepatosplenomegaly BACK:  The back appears normal EXT: Normal ROM in all joints; no deformity noted, no edema; no cyanosis, fistula in the left upper extremity with good thrill and bruit, 2+ left radial pulse, no redness or warmth over the fistula, no tenderness over the fistula SKIN: Normal color for age and race; warm; no rash on exposed skin NEURO: Moves all extremities equally PSYCH: The patient's mood and manner are appropriate.   MEDICAL DECISION MAKING: Patient here with shortness of breath and hypoxia.  She thinks that she is volume overloaded.  Has history of diastolic heart failure.   She is on dialysis.  No missed dialysis sessions.  Bibasilar crackles on exam.  Doing well on nasal cannula.  Does not wear oxygen chronically.  Differential includes pulmonary edema, pneumonia, PE.  Anticipate admission.  Will obtain labs, chest x-ray, EKG.  She denies any chest pain or chest discomfort.  ED PROGRESS: CXR clear.  D dimer 1.70.  On dialysis for years.  Will obtain CTA chest given elevated d dimer and sudden onset SOB.  Will ambulate off O2 in ED.   Patient doing well off O2.  Sats 94% on RA with ambulation in the room.  Patient CT scan shows no pulmonary embolus.  She does have signs of pulmonary hypertension.  She does have mild CHF.  Given she is no longer in any distress and is asymptomatic without hypoxia, I feel it is reasonable to discharge patient home with plans to go to her dialysis center at noon today.  She is comfortable with this plan.   6:55 AM  On reevaluation, patient sats are at 83% on room air at rest sitting upright and awake in the bed.  Given this hypoxia, will discuss with nephrology as I feel she will likely need dialysis here in the hospital and will not be able to make it to her dialysis center given she does not wear oxygen chronically.   7:00 AM  Spoke with Dr. Marval Regal with nephrology.  Appreciate his help.  He recommends obtaining rapid Covid swab.  Patient will be dialyzed once test results have come back.  Does not need dialysis at this time emergently.  Will be dialyzed from the emergency department and then discharged home.  Does not need medical admission at this time.  Patient has been updated with this plan.  Charge nurse to order rapid Covid test.   I reviewed all nursing notes and pertinent previous records as available.  I have interpreted any EKGs, lab and urine results, imaging (as available).    EKG Interpretation  Date/Time:  Monday November 20 2019 04:03:56 EST Ventricular Rate:  76 PR Interval:    QRS Duration: 96 QT  Interval:  412 QTC Calculation: 464 R Axis:   78 Text Interpretation: Sinus rhythm Atrial premature complex Nonspecific T abnrm, anterolateral leads No significant change since last tracing Confirmed by Pryor Curia [phone]) on 11/20/2019 4:09:19 AM          [name] was evaluated in Emergency Department on 11/20/2019 for the symptoms described in the history of present illness. She was evaluated in the context of the global COVID-19 pandemic, which necessitated consideration that the patient might be at risk for infection with the SARS-CoV-2 virus that causes COVID-19. Institutional protocols and algorithms that pertain to the evaluation of patients at risk for COVID-19 are in a state of rapid change based on information released by regulatory bodies including the CDC and federal and state organizations. These policies and algorithms were followed during the patient's care in the ED.  Patient was seen wearing N95, face shield, gloves.      Narayan Scull, Delice Bison, DO 11/20/19 [phone]

## 2019-11-20 NOTE — Progress Notes (Signed)
[facility] Quick Note  S: Aware [name] is in the ED with dyspnea and hypoxia. Work-up with stable labs except ^ BNP and d-dimer 1.7, CXR and chest CT consistent with pulm edema - no PE. Rapid COVID negative. Plan is for HD today and then discharge home after assuming sats improve.  O: Blood pressure (!) 178/72, pulse 75, temperature 98.3 F (36.8 C), temperature source Oral, resp. rate (!) 21, height 5\' 3"  (1.6 m), weight 51.3 kg, SpO2 98 %.  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  A/P: - Hypoxic Resp failure/pulm edema: HD today for volume, then d/c home. Pt aware of plan, says sister can pick her up after. - ESRD: HD today. - HTN: BP high, follow with HD. - Anemia: Hgb 10.6  Veneta Penton, PA-C [employer] Pager [phone]

## 2019-11-20 NOTE — ED Notes (Signed)
Report given to Murriel Hopper, HD RN

## 2019-11-20 NOTE — ED Triage Notes (Signed)
Pt came in [facility] with c/o of SOB. M,W,Fri Dialysis patient. Initial Spo2 79% on RA. 99% Spo2 with 2LNC. Hx. Of HTN BP 240SBP.   18GRAC 1Nitro (0.4mg ) Port on Left Side

## 2019-11-20 NOTE — ED Notes (Signed)
Pt speaking on phone with sister 

## 2019-11-23 ENCOUNTER — Ambulatory Visit (INDEPENDENT_AMBULATORY_CARE_PROVIDER_SITE_OTHER): Payer: Medicare Other | Admitting: Vascular Surgery

## 2019-11-24 DIAGNOSIS — N186 End stage renal disease: Secondary | ICD-10-CM | POA: Diagnosis not present

## 2019-11-24 DIAGNOSIS — D631 Anemia in chronic kidney disease: Secondary | ICD-10-CM | POA: Diagnosis not present

## 2019-11-24 DIAGNOSIS — I120 Hypertensive chronic kidney disease with stage 5 chronic kidney disease or end stage renal disease: Secondary | ICD-10-CM | POA: Diagnosis not present

## 2019-11-24 DIAGNOSIS — E785 Hyperlipidemia, unspecified: Secondary | ICD-10-CM | POA: Diagnosis not present

## 2019-11-24 DIAGNOSIS — M6281 Muscle weakness (generalized): Secondary | ICD-10-CM | POA: Diagnosis not present

## 2019-11-24 DIAGNOSIS — I4891 Unspecified atrial fibrillation: Secondary | ICD-10-CM | POA: Diagnosis not present

## 2019-11-30 DIAGNOSIS — I120 Hypertensive chronic kidney disease with stage 5 chronic kidney disease or end stage renal disease: Secondary | ICD-10-CM | POA: Diagnosis not present

## 2019-11-30 DIAGNOSIS — M6281 Muscle weakness (generalized): Secondary | ICD-10-CM | POA: Diagnosis not present

## 2019-11-30 DIAGNOSIS — D631 Anemia in chronic kidney disease: Secondary | ICD-10-CM | POA: Diagnosis not present

## 2019-11-30 DIAGNOSIS — E785 Hyperlipidemia, unspecified: Secondary | ICD-10-CM | POA: Diagnosis not present

## 2019-11-30 DIAGNOSIS — I4891 Unspecified atrial fibrillation: Secondary | ICD-10-CM | POA: Diagnosis not present

## 2019-11-30 DIAGNOSIS — N186 End stage renal disease: Secondary | ICD-10-CM | POA: Diagnosis not present

## 2019-12-01 DIAGNOSIS — E785 Hyperlipidemia, unspecified: Secondary | ICD-10-CM | POA: Diagnosis not present

## 2019-12-01 DIAGNOSIS — M6281 Muscle weakness (generalized): Secondary | ICD-10-CM | POA: Diagnosis not present

## 2019-12-01 DIAGNOSIS — I120 Hypertensive chronic kidney disease with stage 5 chronic kidney disease or end stage renal disease: Secondary | ICD-10-CM | POA: Diagnosis not present

## 2019-12-01 DIAGNOSIS — D631 Anemia in chronic kidney disease: Secondary | ICD-10-CM | POA: Diagnosis not present

## 2019-12-01 DIAGNOSIS — I4891 Unspecified atrial fibrillation: Secondary | ICD-10-CM | POA: Diagnosis not present

## 2019-12-01 DIAGNOSIS — N186 End stage renal disease: Secondary | ICD-10-CM | POA: Diagnosis not present

## 2019-12-12 DIAGNOSIS — D631 Anemia in chronic kidney disease: Secondary | ICD-10-CM | POA: Diagnosis not present

## 2019-12-12 DIAGNOSIS — M6281 Muscle weakness (generalized): Secondary | ICD-10-CM | POA: Diagnosis not present

## 2019-12-12 DIAGNOSIS — I120 Hypertensive chronic kidney disease with stage 5 chronic kidney disease or end stage renal disease: Secondary | ICD-10-CM | POA: Diagnosis not present

## 2019-12-12 DIAGNOSIS — I4891 Unspecified atrial fibrillation: Secondary | ICD-10-CM | POA: Diagnosis not present

## 2019-12-12 DIAGNOSIS — E785 Hyperlipidemia, unspecified: Secondary | ICD-10-CM | POA: Diagnosis not present

## 2019-12-12 DIAGNOSIS — N186 End stage renal disease: Secondary | ICD-10-CM | POA: Diagnosis not present

## 2019-12-15 DIAGNOSIS — Z992 Dependence on renal dialysis: Secondary | ICD-10-CM | POA: Diagnosis not present

## 2019-12-15 DIAGNOSIS — N186 End stage renal disease: Secondary | ICD-10-CM | POA: Diagnosis not present

## 2019-12-15 DIAGNOSIS — J811 Chronic pulmonary edema: Secondary | ICD-10-CM

## 2019-12-15 DIAGNOSIS — T8612 Kidney transplant failure: Secondary | ICD-10-CM | POA: Diagnosis not present

## 2019-12-15 HISTORY — DX: Chronic pulmonary edema: J81.1

## 2019-12-16 DIAGNOSIS — D509 Iron deficiency anemia, unspecified: Secondary | ICD-10-CM | POA: Diagnosis not present

## 2019-12-16 DIAGNOSIS — N2581 Secondary hyperparathyroidism of renal origin: Secondary | ICD-10-CM | POA: Diagnosis not present

## 2019-12-16 DIAGNOSIS — N186 End stage renal disease: Secondary | ICD-10-CM | POA: Diagnosis not present

## 2019-12-16 DIAGNOSIS — D631 Anemia in chronic kidney disease: Secondary | ICD-10-CM | POA: Diagnosis not present

## 2019-12-16 DIAGNOSIS — T82510A Breakdown (mechanical) of surgically created arteriovenous fistula, initial encounter: Secondary | ICD-10-CM | POA: Diagnosis not present

## 2019-12-16 DIAGNOSIS — Z992 Dependence on renal dialysis: Secondary | ICD-10-CM | POA: Diagnosis not present

## 2019-12-17 IMAGING — US US CAROTID DUPLEX BILAT
1 series · 13 of 24 positions shown · non-contrast
Comparison: No prior duplex

CLINICAL DATA: 66-year-old female with a history of ringing in the
ears.

Cardiovascular risk factors include hypertension, stroke/TIA,
hyperlipidemia, renal disease, smoking
EXAM:
BILATERAL CAROTID DUPLEX ULTRASOUND
TECHNIQUE: Gray scale imaging, color Doppler and duplex ultrasound were
performed of bilateral carotid and vertebral arteries in the neck.

[Series 1: us carotid duplex bilat · 0.06mm/px · 13 of 52 slices shown]
[im 1/52]
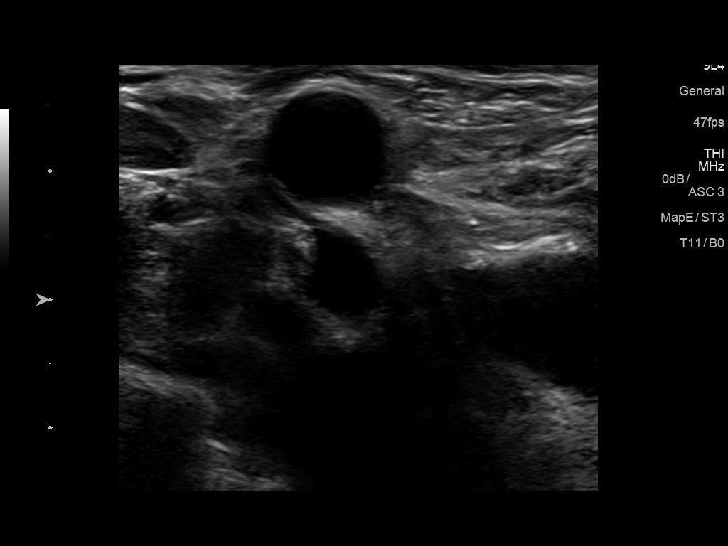
[im 5/52]
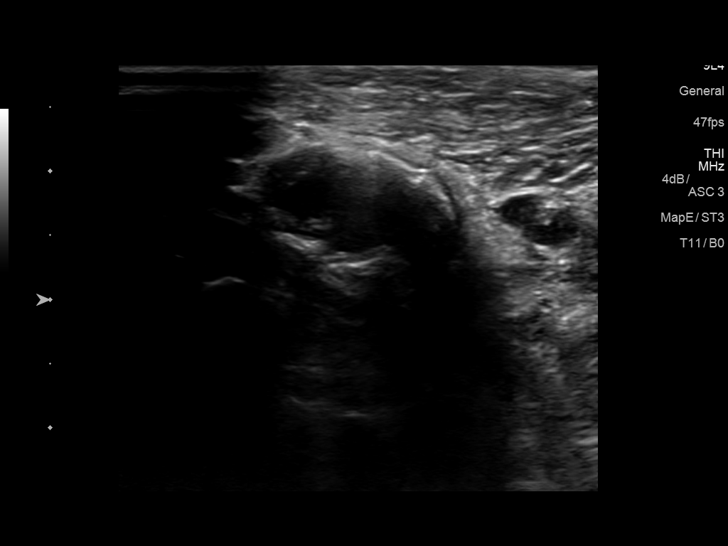
[im 9/52]
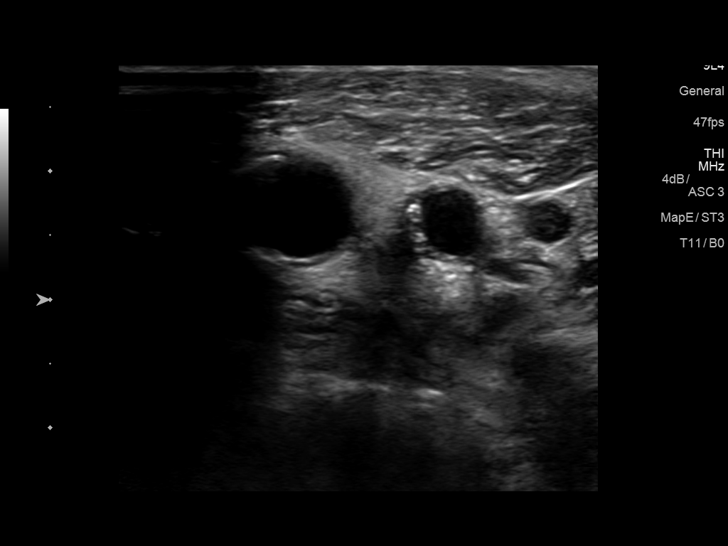
[im 14/52]
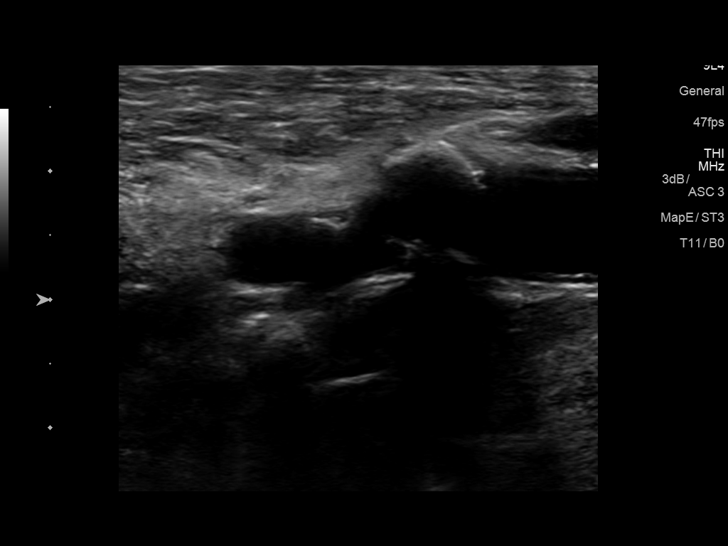
[im 18/52]
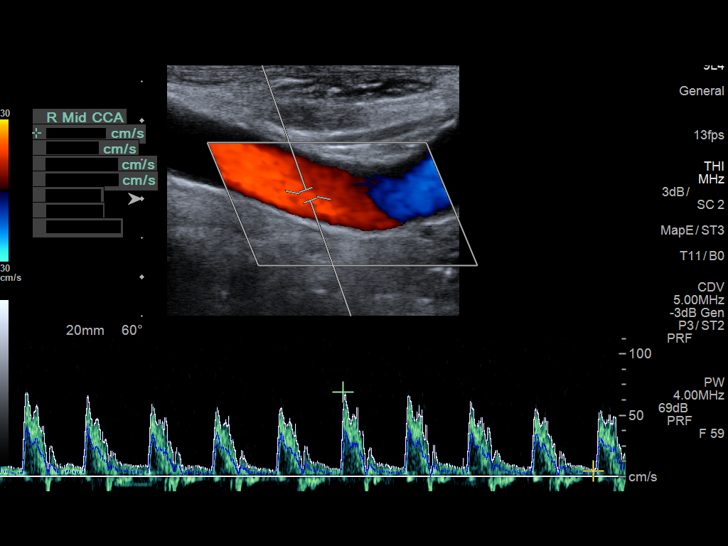
[im 23/52]
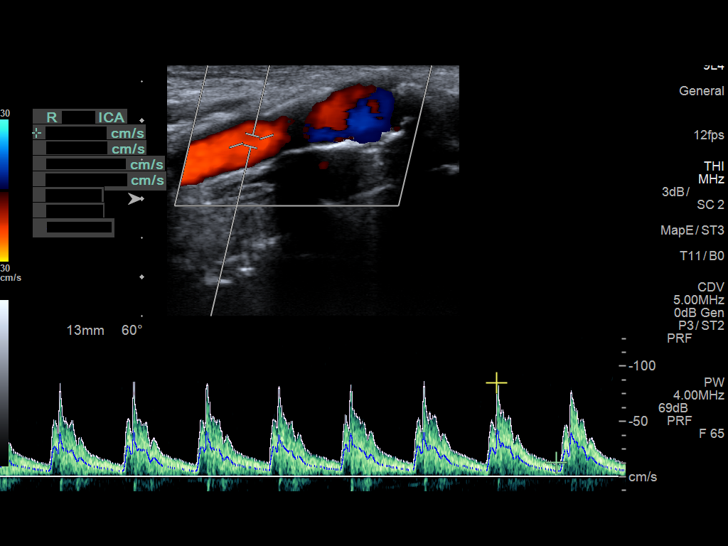
[im 27/52]
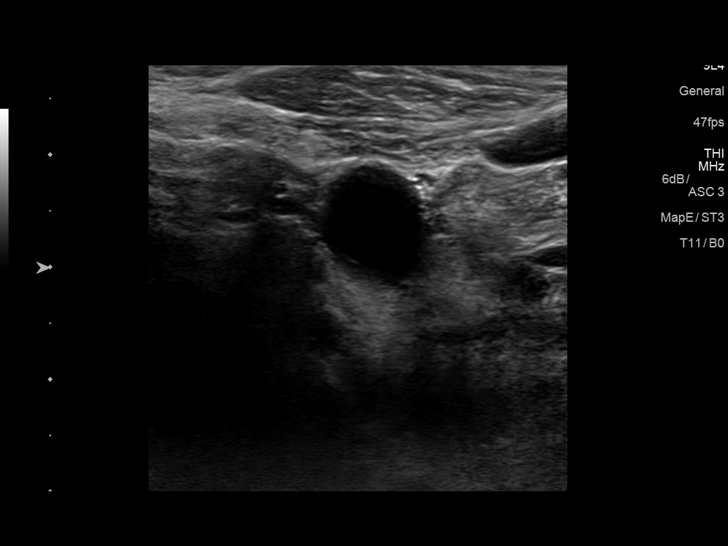
[im 29/52]
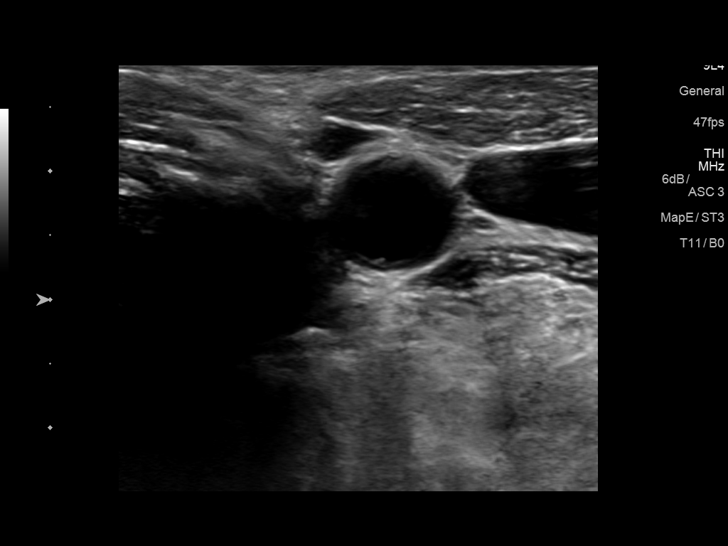
[im 34/52]
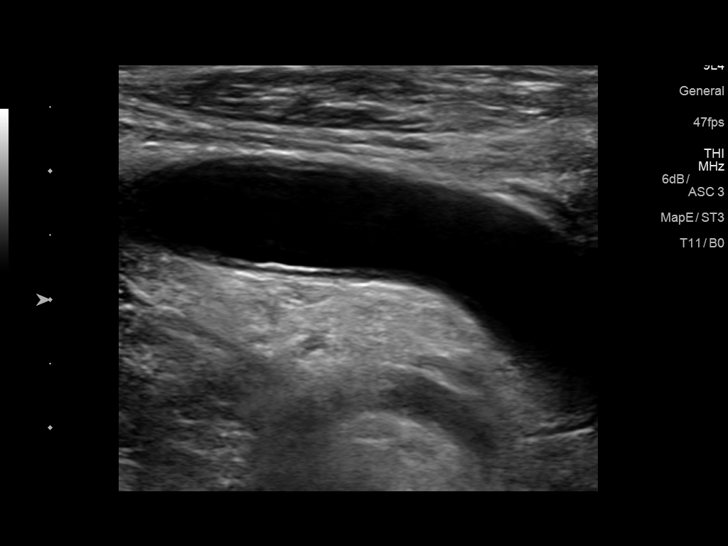
[im 38/52]
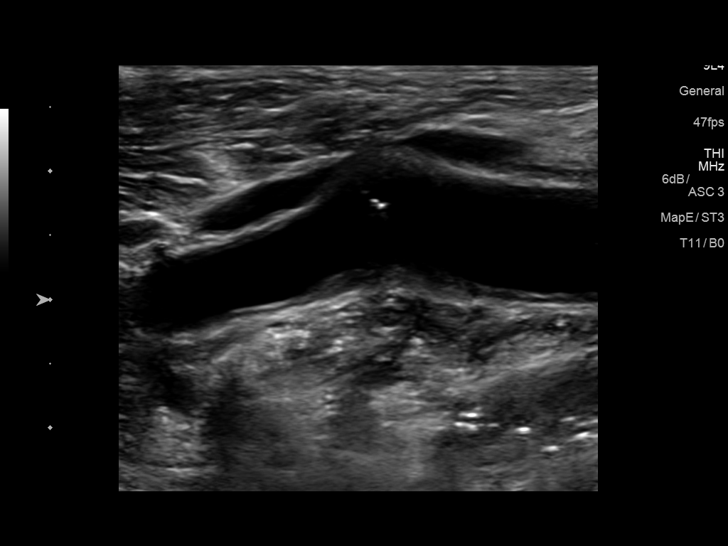
[im 43/52]
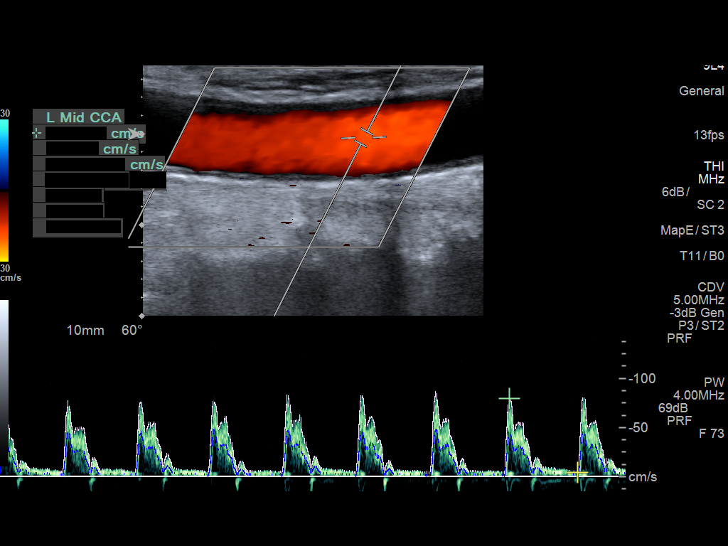
[im 47/52]
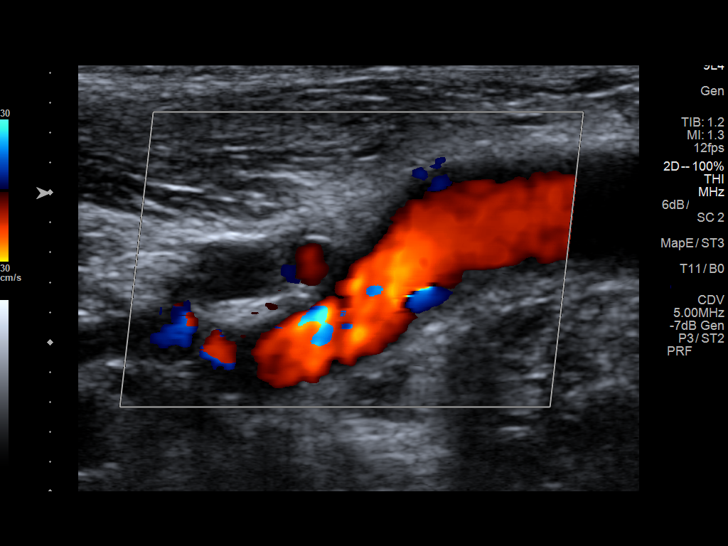
[im 52/52]
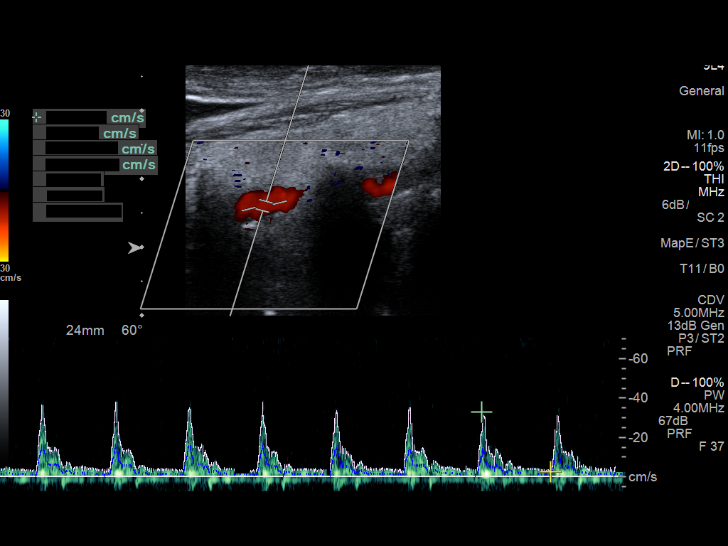

[13 of 24 positions shown; findings below may reference images not displayed]

FINDINGS: Criteria: Quantification of carotid stenosis is based on velocity
parameters that correlate the residual internal carotid diameter
with NASCET-based stenosis levels, using the diameter of the distal
internal carotid lumen as the denominator for stenosis measurement.

The following velocity measurements were obtained:

RIGHT

ICA:  Systolic 85 cm/sec, Diastolic 13 cm/sec

CCA:  126 cm/sec

SYSTOLIC ICA/CCA RATIO:

ECA:  140 cm/sec

LEFT

ICA:  Systolic 68 cm/sec, Diastolic 13 cm/sec

CCA:  80 cm/sec

SYSTOLIC ICA/CCA RATIO:

ECA:  95 cm/sec

Right Brachial SBP: 158

Left Brachial SBP: Not acquired

RIGHT CAROTID ARTERY: No significant calcifications of the right
common carotid artery. Intermediate waveform maintained.
Heterogeneous and partially calcified plaque at the right carotid
bifurcation. No significant lumen shadowing. Low resistance waveform
of the right ICA. Tortuosity.

RIGHT VERTEBRAL ARTERY: Antegrade flow with low resistance waveform.

LEFT CAROTID ARTERY: No significant calcifications of the left
common carotid artery. Intermediate waveform maintained.
Heterogeneous and partially calcified plaque at the left carotid
bifurcation without significant lumen shadowing. Low resistance
waveform of the left ICA. Tortuosity.

LEFT VERTEBRAL ARTERY:  Antegrade flow with low resistance waveform.
IMPRESSION: Color duplex indicates minimal heterogeneous and calcified plaque,
with no hemodynamically significant stenosis by duplex criteria in
the extracranial cerebrovascular circulation.

## 2019-12-18 DIAGNOSIS — T82510A Breakdown (mechanical) of surgically created arteriovenous fistula, initial encounter: Secondary | ICD-10-CM | POA: Insufficient documentation

## 2019-12-18 DIAGNOSIS — N2581 Secondary hyperparathyroidism of renal origin: Secondary | ICD-10-CM | POA: Diagnosis not present

## 2019-12-18 DIAGNOSIS — N186 End stage renal disease: Secondary | ICD-10-CM | POA: Diagnosis not present

## 2019-12-18 DIAGNOSIS — D509 Iron deficiency anemia, unspecified: Secondary | ICD-10-CM | POA: Diagnosis not present

## 2019-12-18 DIAGNOSIS — Z992 Dependence on renal dialysis: Secondary | ICD-10-CM | POA: Diagnosis not present

## 2019-12-18 DIAGNOSIS — D631 Anemia in chronic kidney disease: Secondary | ICD-10-CM | POA: Diagnosis not present

## 2019-12-20 DIAGNOSIS — N186 End stage renal disease: Secondary | ICD-10-CM | POA: Diagnosis not present

## 2019-12-20 DIAGNOSIS — D631 Anemia in chronic kidney disease: Secondary | ICD-10-CM | POA: Diagnosis not present

## 2019-12-20 DIAGNOSIS — N2581 Secondary hyperparathyroidism of renal origin: Secondary | ICD-10-CM | POA: Diagnosis not present

## 2019-12-20 DIAGNOSIS — D509 Iron deficiency anemia, unspecified: Secondary | ICD-10-CM | POA: Diagnosis not present

## 2019-12-20 DIAGNOSIS — Z992 Dependence on renal dialysis: Secondary | ICD-10-CM | POA: Diagnosis not present

## 2019-12-20 DIAGNOSIS — T82510A Breakdown (mechanical) of surgically created arteriovenous fistula, initial encounter: Secondary | ICD-10-CM | POA: Diagnosis not present

## 2019-12-21 ENCOUNTER — Ambulatory Visit: Payer: Medicare Other | Admitting: Adult Health

## 2019-12-21 ENCOUNTER — Other Ambulatory Visit: Payer: Self-pay

## 2019-12-21 ENCOUNTER — Encounter (INDEPENDENT_AMBULATORY_CARE_PROVIDER_SITE_OTHER): Payer: Self-pay | Admitting: Nurse Practitioner

## 2019-12-21 ENCOUNTER — Encounter (INDEPENDENT_AMBULATORY_CARE_PROVIDER_SITE_OTHER): Payer: Self-pay

## 2019-12-21 ENCOUNTER — Ambulatory Visit (INDEPENDENT_AMBULATORY_CARE_PROVIDER_SITE_OTHER): Payer: Medicare Other | Admitting: Vascular Surgery

## 2019-12-21 VITALS — BP 154/75 | HR 81 | Resp 16 | Ht 62.0 in | Wt 115.0 lb

## 2019-12-21 DIAGNOSIS — T829XXA Unspecified complication of cardiac and vascular prosthetic device, implant and graft, initial encounter: Secondary | ICD-10-CM | POA: Insufficient documentation

## 2019-12-21 DIAGNOSIS — D631 Anemia in chronic kidney disease: Secondary | ICD-10-CM | POA: Diagnosis not present

## 2019-12-21 DIAGNOSIS — I5033 Acute on chronic diastolic (congestive) heart failure: Secondary | ICD-10-CM

## 2019-12-21 DIAGNOSIS — M6281 Muscle weakness (generalized): Secondary | ICD-10-CM | POA: Diagnosis not present

## 2019-12-21 DIAGNOSIS — N186 End stage renal disease: Secondary | ICD-10-CM | POA: Diagnosis not present

## 2019-12-21 DIAGNOSIS — Z992 Dependence on renal dialysis: Secondary | ICD-10-CM

## 2019-12-21 DIAGNOSIS — I10 Essential (primary) hypertension: Secondary | ICD-10-CM

## 2019-12-21 DIAGNOSIS — I120 Hypertensive chronic kidney disease with stage 5 chronic kidney disease or end stage renal disease: Secondary | ICD-10-CM | POA: Diagnosis not present

## 2019-12-21 DIAGNOSIS — T829XXS Unspecified complication of cardiac and vascular prosthetic device, implant and graft, sequela: Secondary | ICD-10-CM

## 2019-12-21 DIAGNOSIS — I4891 Unspecified atrial fibrillation: Secondary | ICD-10-CM | POA: Diagnosis not present

## 2019-12-21 DIAGNOSIS — E785 Hyperlipidemia, unspecified: Secondary | ICD-10-CM | POA: Diagnosis not present

## 2019-12-21 NOTE — Progress Notes (Signed)
MRN : [id number]  [name] is a 69 y.o. (11/27/1951) female who presents with chief complaint of  Chief Complaint  Patient presents with  . Follow-up    ulcer on AVF  .  History of Present Illness:  The patient returns to the office for follow up regarding problem with the dialysis access. Currently the patient is maintained via a left arm fistula which is very aneurysmal and has skin erosions.   The patient has had multiple failed left upper extremity accesses.  The patient notes a significant increase in bleeding time after decannulation.  The patient has also been informed that there is increased recirculation.    The patient denies hand pain or other symptoms consistent with steal phenomena.  No significant arm swelling.  The patient denies redness or swelling at the access site. The patient denies fever or chills at home or while on dialysis.  The patient denies amaurosis fugax or recent TIA symptoms. There are no recent neurological changes noted. The patient denies claudication symptoms or rest pain symptoms. The patient denies history of DVT, PE or superficial thrombophlebitis. The patient denies recent episodes of angina or shortness of breath.       Current Meds  Medication Sig  . acetaminophen (TYLENOL) 500 MG tablet Take 1 tablet (500 mg total) by mouth every 6 (six) hours as needed. (Patient taking differently: Take 500 mg by mouth every 6 (six) hours as needed for mild pain. )  . allopurinol (ZYLOPRIM) 300 MG tablet Take 150 mg by mouth at bedtime.  . ALPRAZolam (XANAX) 0.25 MG tablet Take 0.25 mg by mouth daily as needed.  Marland Kitchen amLODipine (NORVASC) 10 MG tablet Take 10 mg by mouth at bedtime.   Lorin Picket 1 GM 210 MG(Fe) tablet Take 420 mg by mouth 3 (three) times daily with meals.   . B Complex-C-Folic Acid (DIALYVITE 735) 0.8 MG WAFR Take 1 Wafer by mouth daily.  Marland Kitchen labetalol (NORMODYNE) 200 MG tablet Take 200 mg by mouth 2 (two) times daily.   Marland Kitchen lactulose  (CHRONULAC) 10 GM/15ML solution Take 20 g by mouth daily as needed for mild constipation.  Marland Kitchen losartan (COZAAR) 50 MG tablet Take 1 tablet (50 mg total) by mouth at bedtime.  . ondansetron (ZOFRAN) 8 MG tablet Take 8 mg by mouth 2 (two) times daily as needed for nausea or vomiting.   . simvastatin (ZOCOR) 20 MG tablet Take 1 tablet (20 mg total) by mouth at bedtime.  Marland Kitchen zolpidem (AMBIEN) 10 MG tablet Take 10 mg by mouth at bedtime as needed for sleep.     Past Medical History:  Diagnosis Date  . Adenomatous polyp of colon 10/2010, 2006, 2015  . Anemia in CKD (chronic kidney disease) 11/07/2012   s/p blood transfusion.   . Arthritis   . CAD (coronary artery disease)    "something like that"  . CHF (congestive heart failure) ([place])   . Constipation   . Depression with anxiety   . Diverticula, colon   . 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  Had some early rejection then stable function for years, then had slow decline of function and went back on hemodialysis in 2012.  Gets HD TTS schedule at [facility] on [facility] still using L forearm AVF.     Marland Kitchen GERD (gastroesophageal reflux disease)   . GI bleed 2017   felt to be  ischemic colitis, last colo 2015  . Headache   . Hyperlipidemia   . Hypertension   . Neurologic gait dysfunction   . Neuromuscular disorder (HCC)    neuropathy hand and legs  . Osteoporosis   . Pneumonia   . 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 Stroke [facility]) 11/2015   TIA  . Weight loss, unintentional     Past Surgical History:  Procedure Laterality Date  . AV FISTULA PLACEMENT     for dialysis  . AV FISTULA PLACEMENT Left 11/22/2015   Procedure: ARTERIOVENOUS (AV) FISTULA CREATION-LEFT BRACHIOCEPHALIC;  Surgeon: Serafina Mitchell, MD;  Location: [place];  Service: Vascular;  Laterality: Left;  . BACK SURGERY    . CERVICAL FUSION    . CHOLECYSTECTOMY   12/02/2012   Procedure: LAPAROSCOPIC CHOLECYSTECTOMY WITH INTRAOPERATIVE CHOLANGIOGRAM;  Surgeon: Edward Jolly, MD;  Location: [place];  Service: General;  Laterality: N/A;  . EYE SURGERY Bilateral    cataract surgery  . EYE SURGERY Left 2019   laser  . HEMATOMA EVACUATION Left 12/24/2016   Procedure: EVACUATION HEMATOMA LEFT UPPER ARM;  Surgeon: Waynetta Sandy, MD;  Location: [place];  Service: Vascular;  Laterality: Left;  . I & D EXTREMITY Left 12/31/2016   Procedure: IRRIGATION AND DEBRIDEMENT EXTREMITY;  Surgeon: Angelia Mould, MD;  Location: [place];  Service: Vascular;  Laterality: Left;  . INSERTION OF DIALYSIS CATHETER Right 12/24/2016   Procedure: INSERTION OF DIALYSIS CATHETER;  Surgeon: Waynetta Sandy, MD;  Location: [place];  Service: Vascular;  Laterality: Right;  . INSERTION OF DIALYSIS CATHETER Right 02/04/2017   Procedure: INSERTION OF DIALYSIS CATHETER;  Surgeon: Waynetta Sandy, MD;  Location: [place];  Service: Vascular;  Laterality: Right;  . KIDNEY TRANSPLANT  1996  . PERIPHERAL VASCULAR CATHETERIZATION Left 10/23/2016   Procedure: Fistulagram;  Surgeon: Elam Dutch, MD;  Location: [place] CV [facility];  Service: Cardiovascular;  Laterality: Left;  . RESECTION OF ARTERIOVENOUS FISTULA ANEURYSM Left 11/22/2015   Procedure: RESECTION OF LEFT RADIOCEPHALIC FISTULA ANEURYSM ;  Surgeon: Serafina Mitchell, MD;  Location: [place];  Service: Vascular;  Laterality: Left;  . REVISON OF ARTERIOVENOUS FISTULA Left 12/22/2016   Procedure: REVISON OF LEFT ARTERIOVENOUS FISTULA;  Surgeon: Waynetta Sandy, MD;  Location: [place];  Service: Vascular;  Laterality: Left;  . REVISON OF ARTERIOVENOUS FISTULA Left 02/04/2017   Procedure: REVISON OF LEFT UPPER ARM ARTERIOVENOUS FISTULA;  Surgeon: Waynetta Sandy, MD;  Location: [place];  Service: Vascular;  Laterality: Left;  . UPPER EXTREMITY ANGIOGRAPHY Bilateral 09/19/2019   Procedure: UPPER EXTREMITY  ANGIOGRAPHY;  Surgeon: Katha Cabal, MD;  Location: [place] [facility];  Service: Cardiovascular;  Laterality: Bilateral;    Social History Social History   Tobacco Use  . Smoking status: Former Smoker    Types: Cigarettes    Quit date: 12/31/1991    Years since quitting: 27.9  . Smokeless tobacco: Never Used  Substance Use Topics  . Alcohol use: No    Alcohol/week: 0.0 standard drinks  . Drug use: No    Family History Family History  Problem Relation Age of Onset  . Colon cancer Brother   . Cancer Brother   . Coronary artery disease Mother 3  . Hyperlipidemia Mother   . Hypertension Mother   . Stroke Maternal Aunt   . Esophageal cancer Neg Hx   . Stomach cancer Neg Hx   .  Rectal cancer Neg Hx     Allergies  Allergen Reactions  . Sulfa Antibiotics Other (See Comments)    Both parents allergic-so will not take  . Adhesive [Tape] Itching     REVIEW OF SYSTEMS (Negative unless checked)  Constitutional: [] Weight loss  [] Fever  [] Chills Cardiac: [] Chest pain   [] Chest pressure   [] Palpitations   [] Shortness of breath when laying flat   [] Shortness of breath with exertion. Vascular:  [] Pain in legs with walking   [] Pain in legs at rest  [] History of DVT   [] Phlebitis   [] Swelling in legs   [] Varicose veins   [] Non-healing ulcers Pulmonary:   [] Uses home oxygen   [] Productive cough   [] Hemoptysis   [] Wheeze  [] COPD   [] Asthma Neurologic:  [] Dizziness   [] Seizures   [] History of stroke   [] History of TIA  [] Aphasia   [] Vissual changes   [] Weakness or numbness in arm   [] Weakness or numbness in leg Musculoskeletal:   [] Joint swelling   [] Joint pain   [] Low back pain Hematologic:  [] Easy bruising  [] Easy bleeding   [] Hypercoagulable state   [] Anemic Gastrointestinal:  [] Diarrhea   [] Vomiting  [] Gastroesophageal reflux/heartburn   [] Difficulty swallowing. Genitourinary:  [x] Chronic kidney disease   [] Difficult urination  [] Frequent urination   [] Blood in urine Skin:   [] Rashes   [] Ulcers  Psychological:  [] History of anxiety   []  History of major depression.  Physical Examination  Vitals:   12/21/19 1450  BP: (!) 154/75  Pulse: 81  Resp: 16  Weight: 115 lb (52.2 kg)  Height: 5\' 2"  (1.575 m)   Body mass index is 21.03 kg/m. Gen: WD/WN, NAD Head: [place]/AT, No temporalis wasting.  Ear/Nose/Throat: Hearing grossly intact, nares w/o erythema or drainage Eyes: PER, EOMI, sclera nonicteric.  Neck: Supple, no large masses.   Pulmonary:  Good air movement, no audible wheezing bilaterally, no use of accessory muscles.  Cardiac: RRR, no JVD Vascular: left arm fistula with aneurysms and skin deterioration. Vessel Right Left  Radial Palpable Palpable  Brachial Palpable Palpable  Gastrointestinal: Non-distended. No guarding/no peritoneal signs.  Musculoskeletal: M/S 5/5 throughout.  No deformity or atrophy.  Neurologic: CN 2-12 intact. Symmetrical.  Speech is fluent. Motor exam as listed above. Psychiatric: Judgment intact, Mood & affect appropriate for pt's clinical situation. Dermatologic: No rashes or ulcers noted.  No changes consistent with cellulitis. Lymph : No lichenification or skin changes of chronic lymphedema.  CBC Lab Results  Component Value Date   WBC 5.9 11/20/2019   HGB 10.6 (L) 11/20/2019   HCT 33.6 (L) 11/20/2019   MCV 92.6 11/20/2019   PLT 182 11/20/2019    BMET    Component Value Date/Time   NA 142 11/20/2019 0430   NA 140 07/13/2018 0814   K 4.5 11/20/2019 0430   CL 94 (L) 11/20/2019 0430   CO2 33 (H) 11/20/2019 0430   GLUCOSE 110 (H) 11/20/2019 0430   BUN 48 (H) 11/20/2019 0430   BUN 45 (H) 07/13/2018 0814   CREATININE 8.59 (H) 11/20/2019 0430   CALCIUM 9.9 11/20/2019 0430   CALCIUM 9.6 08/03/2011 1005   GFRNONAA 4 (L) 11/20/2019 0430   GFRAA 5 (L) 11/20/2019 0430   CrCl cannot be calculated (Patient's most recent lab result is older than the maximum 21 days allowed.).  COAG Lab Results  Component Value Date    INR 1.10 02/23/2017   INR 1.13 11/19/2015   INR 1.06 11/16/2015    Radiology No results found.  Assessment/Plan 1. Complication of vascular access for dialysis, sequela Recommend:  The patient currently has catheter based access.  Patient should have vein mapping of her right arm to plan for upper extremity access creation.  The goal is to improve the quality of dialysis therapy.  The risks, benefits and alternative therapies with respect to to the different kinds of dialysis accesee were reviewed in detail with the patient.  All questions were answered.  The patient agrees to proceed with mapping.   - VEIN MAPPING; Future  2. ESRD (end stage renal disease) on dialysis ([place]) At the present time the patient does not have adequate dialysis access. Arrangements will be made to correct this as noted above.  Continue hemodialysis as ordered without interruption.  Avoid nephrotoxic medications and dehydration.  Further plans per nephrology  3. Essential hypertension Continue antihypertensive medications as already ordered, these medications have been reviewed and there are no changes at this time.   4. Acute on chronic diastolic congestive heart failure (HCC) Continue cardiac and antihypertensive medications as already ordered and reviewed, no changes at this time.  Continue statin as ordered and reviewed, no changes at this time  Nitrates PRN for chest pain   5. Atrial fibrillation, unspecified type ([place]) Continue antiarrhythmia medications as already ordered, these medications have been reviewed and there are no changes at this time.  Continue anticoagulation as ordered by Cardiology Service     Hortencia Pilar, MD  12/21/2019 5:11 PM

## 2019-12-21 NOTE — Progress Notes (Deleted)
[facility]    Provider:  Dr Jaynee Eagles Referring Provider: [name], MD Primary Care Physician:  [name], MD  CC:  Dizziness after dialysis and "juicy ears"  Update 12/21/2019 JM: [name] is a 69 year old female who is being seen today for follow-up regarding history of stroke, complaints of dizziness and possible MCI with strong component of increased stress and depression.  She continues to receive HD 3 times weekly.  She has had multiple ED admissions since prior visit which appears to be all contributed to ESRD on HD.  From a neurological standpoint, she has been overall stable.  Memory ***.  Dizziness ***.  Blood pressure today ***.    Interval history 06/20/2019 Dr. Jaynee Eagles:  69 year old seen here for memory changes in the past,  and today new referral for some vague dizziness.She has extreme stress and caring for her mother with dementia. PMHx stroke, HTN, HLD, headache, depression with anxiety, ESRD on dialysis, anemia, CHF, HTN, HLD, prior CVA. She says her ears are "juicy" and she has to have her ears cleaned out. She says she has some dizziness, last vertigo was 3 months ago. She haas dizziness when she gets off of dialysis, no other times other than when walking and turns the wrong way. No vertigo for months. Dizziness is mild and brief and rare, and resolves on it own. She does not drink fluids, she tries not to drink fluids. She is on a fixed income, she does not have the $200 for this appointment and doesn't think it is neurologic. She denies any other association with her dizziness.  MRI brain: 09/16/2018: IMPRESSION: reviewed images and agree with the following 1. No acute intracranial process. 2. Multiple old supra and lacunar infarcts. Moderate to severe chronic small vessel ischemic changes. 3. Chronic microhemorrhages in a distribution suggestive of chronic hypertension, less likely amyloid angiopathy. 4. Mild parenchymal brain volume loss.  HPI  07/13/2018 Dr. Jaynee Eagles:  [name] is a 69 y.o. female here as a referral from Dr. Delfina Redwood for new problem memory loss. PMHx . Since her stroke she feels like her memory is impaired. Her mother has dementia and she is caring for her so there is stress. She cares for her mother. She has difficulty with long-term memory, trying to remember things that used to be. She says her mother gets on her nerves and makes her forget things, she talks awful to her. Doesn't have any problems with year or month but may have difficulty with day. No repeating things. She is caring for the household, cleaning, shopping. She does not miss medications and she manage mom's medications. Her son pays bills. She drive, no accidents, no accidents in the home. She forgets what day it is. Mom with dementia unknown what kind. Progressively worsening. She has personality changes, aggravated, saying things that she wouldn't, agitation.   Reviewed notes, labs and imaging from outside physicians, which showed:  Reviewed CT of the head images and agree:  1. Scattered white matter changes. No acute intracranial abnormality. 2. Widening of the pre odontoid space and malalignment of the first and second spinal laminal lines, unchanged since 2015. This is not an acute finding and the patient reports no trauma. Recommend clinical correlation. Dedicated CT imaging of the cervical spine could of course better evaluate if clinically warranted.  Review of Systems: Patient complains of symptoms per HPI as well as the following symptoms: memory loss, anemia, dizziness. Pertinent negatives and positives per HPI. All others  negative.   Social History   Socioeconomic History  . Marital status: Widowed    Spouse name: Not on file  . Number of children: 2  . Years of education: Not on file  . Highest education level: Not on file  Occupational History  . Not on file  Tobacco Use  . Smoking status: Former Smoker    Types: Cigarettes     Quit date: 12/31/1991    Years since quitting: 27.9  . Smokeless tobacco: Never Used  Substance and Sexual Activity  . Alcohol use: No    Alcohol/week: 0.0 standard drinks  . Drug use: No  . Sexual activity: Never  Other Topics Concern  . Not on file  Social History Narrative   Living with her mother    Right handed   Caffeine: only decaf clears ("no brown sodas" or coffee d/t kidney disease)   Low potassium foods d/t kidney disease   Social Determinants of Health   Financial Resource Strain:   . Difficulty of Paying Living Expenses: Not on file  Food Insecurity:   . Worried About Charity fundraiser in the Last Year: Not on file  . Ran Out of Food in the Last Year: Not on file  Transportation Needs:   . Lack of Transportation (Medical): Not on file  . Lack of Transportation (Non-Medical): Not on file  Physical Activity:   . Days of Exercise per Week: Not on file  . Minutes of Exercise per Session: Not on file  Stress:   . Feeling of Stress : Not on file  Social Connections:   . Frequency of Communication with Friends and Family: Not on file  . Frequency of Social Gatherings with Friends and Family: Not on file  . Attends Religious Services: Not on file  . Active Member of Clubs or Organizations: Not on file  . Attends Archivist Meetings: Not on file  . Marital Status: Not on file  Intimate Partner Violence:   . Fear of Current or Ex-Partner: Not on file  . Emotionally Abused: Not on file  . Physically Abused: Not on file  . Sexually Abused: Not on file    Family History  Problem Relation Age of Onset  . Colon cancer Brother   . Cancer Brother   . Coronary artery disease Mother 7  . Hyperlipidemia Mother   . Hypertension Mother   . Stroke Maternal Aunt   . Esophageal cancer Neg Hx   . Stomach cancer Neg Hx   . Rectal cancer Neg Hx     Past Medical History:  Diagnosis Date  . Adenomatous polyp of colon 10/2010, 2006, 2015  . Anemia in CKD  (chronic kidney disease) 11/07/2012   s/p blood transfusion.   . Arthritis   . CAD (coronary artery disease)    "something like that"  . CHF (congestive heart failure) ([place])   . Constipation   . Depression with anxiety   . Diverticula, colon   . 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  Had some early rejection then stable function for years, then had slow decline of function and went back on hemodialysis in 2012.  Gets HD TTS schedule at [facility] still using L forearm AVF.     Marland Kitchen GERD (gastroesophageal reflux disease)   . GI bleed 2017   felt to be ischemic colitis, last colo 2015  .  Headache   . Hyperlipidemia   . Hypertension   . Neurologic gait dysfunction   . Neuromuscular disorder (HCC)    neuropathy hand and legs  . Osteoporosis   . Pneumonia   . 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 Stroke [facility]) 11/2015   TIA  . Weight loss, unintentional     Past Surgical History:  Procedure Laterality Date  . AV FISTULA PLACEMENT     for dialysis  . AV FISTULA PLACEMENT Left 11/22/2015   Procedure: ARTERIOVENOUS (AV) FISTULA CREATION-LEFT BRACHIOCEPHALIC;  Surgeon: Serafina Mitchell, MD;  Location: [place];  Service: Vascular;  Laterality: Left;  . BACK SURGERY    . CERVICAL FUSION    . CHOLECYSTECTOMY  12/02/2012   Procedure: LAPAROSCOPIC CHOLECYSTECTOMY WITH INTRAOPERATIVE CHOLANGIOGRAM;  Surgeon: Edward Jolly, MD;  Location: [place];  Service: General;  Laterality: N/A;  . EYE SURGERY Bilateral    cataract surgery  . EYE SURGERY Left 2019   laser  . HEMATOMA EVACUATION Left 12/24/2016   Procedure: EVACUATION HEMATOMA LEFT UPPER ARM;  Surgeon: Waynetta Sandy, MD;  Location: [place];  Service: Vascular;  Laterality: Left;  . I & D EXTREMITY Left 12/31/2016   Procedure: IRRIGATION AND DEBRIDEMENT EXTREMITY;  Surgeon: Angelia Mould, MD;   Location: [place];  Service: Vascular;  Laterality: Left;  . INSERTION OF DIALYSIS CATHETER Right 12/24/2016   Procedure: INSERTION OF DIALYSIS CATHETER;  Surgeon: Waynetta Sandy, MD;  Location: [place];  Service: Vascular;  Laterality: Right;  . INSERTION OF DIALYSIS CATHETER Right 02/04/2017   Procedure: INSERTION OF DIALYSIS CATHETER;  Surgeon: Waynetta Sandy, MD;  Location: [place];  Service: Vascular;  Laterality: Right;  . KIDNEY TRANSPLANT  1996  . PERIPHERAL VASCULAR CATHETERIZATION Left 10/23/2016   Procedure: Fistulagram;  Surgeon: Elam Dutch, MD;  Location: [place] [facility];  Service: Cardiovascular;  Laterality: Left;  . RESECTION OF ARTERIOVENOUS FISTULA ANEURYSM Left 11/22/2015   Procedure: RESECTION OF LEFT RADIOCEPHALIC FISTULA ANEURYSM ;  Surgeon: Serafina Mitchell, MD;  Location: [place];  Service: Vascular;  Laterality: Left;  . REVISON OF ARTERIOVENOUS FISTULA Left 12/22/2016   Procedure: REVISON OF LEFT ARTERIOVENOUS FISTULA;  Surgeon: Waynetta Sandy, MD;  Location: [place];  Service: Vascular;  Laterality: Left;  . REVISON OF ARTERIOVENOUS FISTULA Left 02/04/2017   Procedure: REVISON OF LEFT UPPER ARM ARTERIOVENOUS FISTULA;  Surgeon: Waynetta Sandy, MD;  Location: [place];  Service: Vascular;  Laterality: Left;  . UPPER EXTREMITY ANGIOGRAPHY Bilateral 09/19/2019   Procedure: UPPER EXTREMITY ANGIOGRAPHY;  Surgeon: Katha Cabal, MD;  Location: [place] [facility];  Service: Cardiovascular;  Laterality: Bilateral;    Current Outpatient Medications  Medication Sig Dispense Refill  . acetaminophen (TYLENOL) 500 MG tablet Take 1 tablet (500 mg total) by mouth every 6 (six) hours as needed. (Patient taking differently: Take 500 mg by mouth every 6 (six) hours as needed for mild pain. ) 30 tablet 0  . allopurinol (ZYLOPRIM) 300 MG tablet Take 150 mg by mouth at bedtime.    . ALPRAZolam (XANAX) 0.25 MG tablet Take 0.25 mg by mouth daily as needed.     Marland Kitchen amLODipine (NORVASC) 10 MG tablet Take 10 mg by mouth at bedtime.     Lorin Picket 1 GM 210 MG(Fe) tablet Take 420 mg by mouth 3 (three) times daily with meals.     . B Complex-C-Folic Acid (DIALYVITE 213)  0.8 MG WAFR Take 1 Wafer by mouth daily.  11  . labetalol (NORMODYNE) 200 MG tablet Take 200 mg by mouth 2 (two) times daily.     Marland Kitchen lactulose (CHRONULAC) 10 GM/15ML solution Take 20 g by mouth daily as needed for mild constipation.    Marland Kitchen losartan (COZAAR) 50 MG tablet Take 1 tablet (50 mg total) by mouth at bedtime. 30 tablet 0  . ondansetron (ZOFRAN) 8 MG tablet Take 8 mg by mouth 2 (two) times daily as needed for nausea or vomiting.   0  . simvastatin (ZOCOR) 20 MG tablet Take 1 tablet (20 mg total) by mouth at bedtime. 30 tablet 0  . zolpidem (AMBIEN) 10 MG tablet Take 10 mg by mouth at bedtime as needed for sleep.      No current facility-administered medications for this visit.    Allergies as of 12/21/2019 - Review Complete 10/11/2019  Allergen Reaction Noted  . Sulfa antibiotics Other (See Comments) 11/06/2012  . Adhesive [tape] Itching 12/29/2016    Vitals: There were no vitals taken for this visit. Last Weight:  Wt Readings from Last 1 Encounters:  11/20/19 108 lb 7.5 oz (49.2 kg)   Last Height:   Ht Readings from Last 1 Encounters:  11/20/19 5\' 3"  (1.6 m)   Physical exam: Exam: Gen: NAD CV: RRR, no MRG. No Carotid Bruits. No peripheral edema, warm, nontender Eyes: Conjunctivae clear without exudates or hemorrhage  Neuro: Detailed Neurologic Exam  Speech:    Speech is normal; fluent and spontaneous with normal comprehension.  Cognition:  MMSE - Mini Mental State Exam 07/13/2018  Orientation to time 3  Orientation to Place 3  Registration 3  Attention/ Calculation 1  Recall 2  Language- name 2 objects 2  Language- repeat 1  Language- follow 3 step command 3  Language- read & follow direction 1  Write a sentence 1  Copy design 1  Total score 21       The patient is oriented to person, place, and time;     recent and remote memory impaired;     language fluent;     Impaired attention, concentration, fund of knowledge Cranial Nerves:    The pupils are equal, round, and reactive to light. Attempted fundosopic exam could not visualize. Visual fields are full to finger confrontation. Extraocular movements are intact. Trigeminal sensation is intact and the muscles of mastication are normal. The face is symmetric. The palate elevates in the midline. Hearing intact. Voice is normal. Shoulder shrug is normal. The tongue has normal motion without fasciculations.   Gait:    slighty antalgic, with a cane, stable  Motor Observation:    No asymmetry, no atrophy, and no involuntary movements noted. Tone:    Normal muscle tone.    Posture:    Posture is normal. normal erect    Strength:    Very poor effort but strength appears symmetric     Sensation: intact to LT     Reflex Exam:  DTR's:    Deep tendon reflexes in the upper and lower extremities are symmetric bilaterally.   Toes:    The toes are equiv bilaterally.   Clonus:    Clonus is absent.       Assessment/Plan:   69 year old seen here for memory changes in the past,  and today new referral for some vague dizziness.She has extreme stress and caring for her mother with dementia. PMHx stroke, HTN, HLD, headache, depression with anxiety, ESRD  on dialysis, anemia, CHF, HTN, HLD, prior CVA.  - She has some dizziness after dialysis,need to discuss with dialysis and nephrologist, she is also anemic, likely due to fluid shifts during dialysis - She says her ears are "juicy" and she has to have her ears cleaned out. I will not charge her today, she would have to pay out of pocket $200(unclear, 3rd party? MVA?) for some vague dizziness after dialysis and vertigo 3 months ago resolved. Will send to ENT, neuro exam stable. Dizziness is mild and brief and rare, and resolves on it own.  She also  does not drink fluids and is anemic, follow up with Dr. Delfina Redwood and her other specialists. she does not have the $200 for this appointment  I won't charge her and send her to ENT and release back to pcp but come back if symptoms progress, worsen, go to ED for acute symptoms or stroke symptoms - follow closely with pcp for vascular risk factors and stroke prevention. Follow up here in 6 months with our stroke team Aundria Bitterman.  - - Memory complaints in the setting of extreme stress and caring for mother with dementia. 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 Return to pcp for treatment of mood disorder and needs therapy. May consider vescular causes of dementia or MCI, can consider formal memory testing in the future. She reports she is stable, no changes in memory.  PRIOR evaluation:  - Memory complaints in the setting of extreme stress and caring for mother with dementia. 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 Return to pcp for treatment of mood disorder and needs therapy. May consider vescular causes of dementia or MCI, can consider formal memory testing int he future.  - TSh and B12 normal  - MRI of the brain and labs were ordered - can consider FDG PET Scan in the future or formal memory testing - Discussed memory compensation strategies.  No orders of the defined types were placed in this encounter.  I had a d/w patient about her recent stroke, risk for recurrent stroke/TIAs, personally independently reviewed imaging studies and stroke evaluation results and answered questions.Continue Plavix for secondary stroke prevention and maintain strict control of hypertension with blood pressure goal below 130/90, diabetes with hemoglobin A1c goal below 6.5% and lipids with LDL cholesterol goal below 70 mg/dL. I also advised the patient to eat a healthy diet with plenty of whole grains, cereals, fruits and vegetables, exercise regularly and maintain ideal body weight  .Discussed stroke prevention.   Sarina Ill, MD  [facility] [address] [place], [place] [address]  Phone [phone] Fax [phone]  A total of 30 minutes was spent face-to-face with this patient. Over half this time was spent on counseling patient on the  No diagnosis found. diagnosis and different diagnostic and therapeutic options, counseling and coordination of care, risks ans benefits of management, compliance, or risk factor reduction and education.

## 2019-12-22 DIAGNOSIS — D509 Iron deficiency anemia, unspecified: Secondary | ICD-10-CM | POA: Diagnosis not present

## 2019-12-22 DIAGNOSIS — N2581 Secondary hyperparathyroidism of renal origin: Secondary | ICD-10-CM | POA: Diagnosis not present

## 2019-12-22 DIAGNOSIS — D631 Anemia in chronic kidney disease: Secondary | ICD-10-CM | POA: Diagnosis not present

## 2019-12-22 DIAGNOSIS — Z992 Dependence on renal dialysis: Secondary | ICD-10-CM | POA: Diagnosis not present

## 2019-12-22 DIAGNOSIS — T82510A Breakdown (mechanical) of surgically created arteriovenous fistula, initial encounter: Secondary | ICD-10-CM | POA: Diagnosis not present

## 2019-12-22 DIAGNOSIS — N186 End stage renal disease: Secondary | ICD-10-CM | POA: Diagnosis not present

## 2019-12-25 DIAGNOSIS — D631 Anemia in chronic kidney disease: Secondary | ICD-10-CM | POA: Diagnosis not present

## 2019-12-25 DIAGNOSIS — N2581 Secondary hyperparathyroidism of renal origin: Secondary | ICD-10-CM | POA: Diagnosis not present

## 2019-12-25 DIAGNOSIS — Z992 Dependence on renal dialysis: Secondary | ICD-10-CM | POA: Diagnosis not present

## 2019-12-25 DIAGNOSIS — N186 End stage renal disease: Secondary | ICD-10-CM | POA: Diagnosis not present

## 2019-12-25 DIAGNOSIS — T82510A Breakdown (mechanical) of surgically created arteriovenous fistula, initial encounter: Secondary | ICD-10-CM | POA: Diagnosis not present

## 2019-12-25 DIAGNOSIS — D509 Iron deficiency anemia, unspecified: Secondary | ICD-10-CM | POA: Diagnosis not present

## 2019-12-26 DIAGNOSIS — I4891 Unspecified atrial fibrillation: Secondary | ICD-10-CM | POA: Diagnosis not present

## 2019-12-26 DIAGNOSIS — I120 Hypertensive chronic kidney disease with stage 5 chronic kidney disease or end stage renal disease: Secondary | ICD-10-CM | POA: Diagnosis not present

## 2019-12-26 DIAGNOSIS — D631 Anemia in chronic kidney disease: Secondary | ICD-10-CM | POA: Diagnosis not present

## 2019-12-26 DIAGNOSIS — N186 End stage renal disease: Secondary | ICD-10-CM | POA: Diagnosis not present

## 2019-12-26 DIAGNOSIS — M6281 Muscle weakness (generalized): Secondary | ICD-10-CM | POA: Diagnosis not present

## 2019-12-26 DIAGNOSIS — E785 Hyperlipidemia, unspecified: Secondary | ICD-10-CM | POA: Diagnosis not present

## 2019-12-27 DIAGNOSIS — Z992 Dependence on renal dialysis: Secondary | ICD-10-CM | POA: Diagnosis not present

## 2019-12-27 DIAGNOSIS — N2581 Secondary hyperparathyroidism of renal origin: Secondary | ICD-10-CM | POA: Diagnosis not present

## 2019-12-27 DIAGNOSIS — D509 Iron deficiency anemia, unspecified: Secondary | ICD-10-CM | POA: Diagnosis not present

## 2019-12-27 DIAGNOSIS — T82510A Breakdown (mechanical) of surgically created arteriovenous fistula, initial encounter: Secondary | ICD-10-CM | POA: Diagnosis not present

## 2019-12-27 DIAGNOSIS — N186 End stage renal disease: Secondary | ICD-10-CM | POA: Diagnosis not present

## 2019-12-27 DIAGNOSIS — D631 Anemia in chronic kidney disease: Secondary | ICD-10-CM | POA: Diagnosis not present

## 2019-12-28 DIAGNOSIS — M6281 Muscle weakness (generalized): Secondary | ICD-10-CM | POA: Diagnosis not present

## 2019-12-28 DIAGNOSIS — E785 Hyperlipidemia, unspecified: Secondary | ICD-10-CM | POA: Diagnosis not present

## 2019-12-28 DIAGNOSIS — N186 End stage renal disease: Secondary | ICD-10-CM | POA: Diagnosis not present

## 2019-12-28 DIAGNOSIS — D631 Anemia in chronic kidney disease: Secondary | ICD-10-CM | POA: Diagnosis not present

## 2019-12-28 DIAGNOSIS — I120 Hypertensive chronic kidney disease with stage 5 chronic kidney disease or end stage renal disease: Secondary | ICD-10-CM | POA: Diagnosis not present

## 2019-12-28 DIAGNOSIS — I4891 Unspecified atrial fibrillation: Secondary | ICD-10-CM | POA: Diagnosis not present

## 2019-12-29 DIAGNOSIS — N186 End stage renal disease: Secondary | ICD-10-CM | POA: Diagnosis not present

## 2019-12-29 DIAGNOSIS — N2581 Secondary hyperparathyroidism of renal origin: Secondary | ICD-10-CM | POA: Diagnosis not present

## 2019-12-29 DIAGNOSIS — D631 Anemia in chronic kidney disease: Secondary | ICD-10-CM | POA: Diagnosis not present

## 2019-12-29 DIAGNOSIS — T82510A Breakdown (mechanical) of surgically created arteriovenous fistula, initial encounter: Secondary | ICD-10-CM | POA: Diagnosis not present

## 2019-12-29 DIAGNOSIS — Z992 Dependence on renal dialysis: Secondary | ICD-10-CM | POA: Diagnosis not present

## 2019-12-29 DIAGNOSIS — D509 Iron deficiency anemia, unspecified: Secondary | ICD-10-CM | POA: Diagnosis not present

## 2020-01-01 ENCOUNTER — Emergency Department (HOSPITAL_COMMUNITY): Payer: Medicare Other

## 2020-01-01 ENCOUNTER — Encounter (HOSPITAL_COMMUNITY): Payer: Self-pay | Admitting: *Deleted

## 2020-01-01 ENCOUNTER — Inpatient Hospital Stay (HOSPITAL_COMMUNITY): Payer: Medicare Other

## 2020-01-01 ENCOUNTER — Observation Stay (HOSPITAL_COMMUNITY)
Admission: EM | Admit: 2020-01-01 | Discharge: 2020-01-02 | Disposition: A | Discharge status: Home Health | Payer: Medicare Other | Attending: Internal Medicine | Admitting: Internal Medicine

## 2020-01-01 ENCOUNTER — Other Ambulatory Visit: Payer: Self-pay

## 2020-01-01 DIAGNOSIS — I2510 Atherosclerotic heart disease of native coronary artery without angina pectoris: Secondary | ICD-10-CM | POA: Insufficient documentation

## 2020-01-01 DIAGNOSIS — K219 Gastro-esophageal reflux disease without esophagitis: Secondary | ICD-10-CM | POA: Diagnosis not present

## 2020-01-01 DIAGNOSIS — Z79899 Other long term (current) drug therapy: Secondary | ICD-10-CM | POA: Insufficient documentation

## 2020-01-01 DIAGNOSIS — Z20822 Contact with and (suspected) exposure to covid-19: Secondary | ICD-10-CM | POA: Diagnosis not present

## 2020-01-01 DIAGNOSIS — Z881 Allergy status to other antibiotic agents status: Secondary | ICD-10-CM | POA: Insufficient documentation

## 2020-01-01 DIAGNOSIS — R197 Diarrhea, unspecified: Secondary | ICD-10-CM | POA: Diagnosis not present

## 2020-01-01 DIAGNOSIS — I132 Hypertensive heart and chronic kidney disease with heart failure and with stage 5 chronic kidney disease, or end stage renal disease: Secondary | ICD-10-CM | POA: Diagnosis not present

## 2020-01-01 DIAGNOSIS — I5043 Acute on chronic combined systolic (congestive) and diastolic (congestive) heart failure: Secondary | ICD-10-CM | POA: Diagnosis not present

## 2020-01-01 DIAGNOSIS — Z87891 Personal history of nicotine dependence: Secondary | ICD-10-CM | POA: Insufficient documentation

## 2020-01-01 DIAGNOSIS — Z992 Dependence on renal dialysis: Secondary | ICD-10-CM | POA: Insufficient documentation

## 2020-01-01 DIAGNOSIS — I083 Combined rheumatic disorders of mitral, aortic and tricuspid valves: Secondary | ICD-10-CM | POA: Insufficient documentation

## 2020-01-01 DIAGNOSIS — I120 Hypertensive chronic kidney disease with stage 5 chronic kidney disease or end stage renal disease: Secondary | ICD-10-CM | POA: Diagnosis not present

## 2020-01-01 DIAGNOSIS — I4891 Unspecified atrial fibrillation: Secondary | ICD-10-CM | POA: Diagnosis not present

## 2020-01-01 DIAGNOSIS — R079 Chest pain, unspecified: Secondary | ICD-10-CM | POA: Diagnosis not present

## 2020-01-01 DIAGNOSIS — M1990 Unspecified osteoarthritis, unspecified site: Secondary | ICD-10-CM | POA: Diagnosis not present

## 2020-01-01 DIAGNOSIS — R0602 Shortness of breath: Secondary | ICD-10-CM | POA: Diagnosis present

## 2020-01-01 DIAGNOSIS — I491 Atrial premature depolarization: Secondary | ICD-10-CM | POA: Diagnosis not present

## 2020-01-01 DIAGNOSIS — N2581 Secondary hyperparathyroidism of renal origin: Secondary | ICD-10-CM | POA: Diagnosis not present

## 2020-01-01 DIAGNOSIS — Z8249 Family history of ischemic heart disease and other diseases of the circulatory system: Secondary | ICD-10-CM | POA: Insufficient documentation

## 2020-01-01 DIAGNOSIS — Z882 Allergy status to sulfonamides status: Secondary | ICD-10-CM | POA: Insufficient documentation

## 2020-01-01 DIAGNOSIS — J810 Acute pulmonary edema: Secondary | ICD-10-CM

## 2020-01-01 DIAGNOSIS — E43 Unspecified severe protein-calorie malnutrition: Secondary | ICD-10-CM | POA: Diagnosis not present

## 2020-01-01 DIAGNOSIS — Z681 Body mass index (BMI) 19 or less, adult: Secondary | ICD-10-CM | POA: Diagnosis not present

## 2020-01-01 DIAGNOSIS — R2681 Unsteadiness on feet: Secondary | ICD-10-CM | POA: Diagnosis not present

## 2020-01-01 DIAGNOSIS — D631 Anemia in chronic kidney disease: Secondary | ICD-10-CM | POA: Diagnosis not present

## 2020-01-01 DIAGNOSIS — N186 End stage renal disease: Secondary | ICD-10-CM | POA: Diagnosis not present

## 2020-01-01 DIAGNOSIS — R0902 Hypoxemia: Secondary | ICD-10-CM | POA: Diagnosis not present

## 2020-01-01 DIAGNOSIS — Z940 Kidney transplant status: Secondary | ICD-10-CM | POA: Diagnosis not present

## 2020-01-01 DIAGNOSIS — I5032 Chronic diastolic (congestive) heart failure: Secondary | ICD-10-CM | POA: Insufficient documentation

## 2020-01-01 DIAGNOSIS — I5033 Acute on chronic diastolic (congestive) heart failure: Secondary | ICD-10-CM | POA: Insufficient documentation

## 2020-01-01 DIAGNOSIS — I509 Heart failure, unspecified: Secondary | ICD-10-CM

## 2020-01-01 DIAGNOSIS — F418 Other specified anxiety disorders: Secondary | ICD-10-CM | POA: Insufficient documentation

## 2020-01-01 DIAGNOSIS — E785 Hyperlipidemia, unspecified: Secondary | ICD-10-CM | POA: Diagnosis not present

## 2020-01-01 DIAGNOSIS — I340 Nonrheumatic mitral (valve) insufficiency: Secondary | ICD-10-CM

## 2020-01-01 DIAGNOSIS — J811 Chronic pulmonary edema: Secondary | ICD-10-CM | POA: Diagnosis present

## 2020-01-01 DIAGNOSIS — Z888 Allergy status to other drugs, medicaments and biological substances status: Secondary | ICD-10-CM | POA: Insufficient documentation

## 2020-01-01 DIAGNOSIS — Z8673 Personal history of transient ischemic attack (TIA), and cerebral infarction without residual deficits: Secondary | ICD-10-CM | POA: Diagnosis not present

## 2020-01-01 DIAGNOSIS — I10 Essential (primary) hypertension: Secondary | ICD-10-CM | POA: Diagnosis present

## 2020-01-01 DIAGNOSIS — Z823 Family history of stroke: Secondary | ICD-10-CM | POA: Insufficient documentation

## 2020-01-01 DIAGNOSIS — E8770 Fluid overload, unspecified: Secondary | ICD-10-CM | POA: Diagnosis present

## 2020-01-01 LAB — BASIC METABOLIC PANEL
Anion gap: 16 — ABNORMAL HIGH (ref 5–15)
BUN: 46 mg/dL — ABNORMAL HIGH (ref 8–23)
CO2: 25 mmol/L (ref 22–32)
Calcium: 9.3 mg/dL (ref 8.9–10.3)
Chloride: 99 mmol/L (ref 98–111)
Creatinine, Ser: 9.06 mg/dL — ABNORMAL HIGH (ref 0.44–1.00)
GFR calc Af Amer: 5 mL/min — ABNORMAL LOW (ref >60)
GFR calc non Af Amer: 4 mL/min — ABNORMAL LOW (ref >60)
Glucose, Bld: 120 mg/dL — ABNORMAL HIGH (ref 70–99)
Potassium: 3.9 mmol/L (ref 3.5–5.1)
Sodium: 140 mmol/L (ref 135–145)

## 2020-01-01 LAB — CBC
HCT: 36.6 % (ref 36.0–46.0)
Hemoglobin: 11.7 g/dL — ABNORMAL LOW (ref 12.0–15.0)
MCH: 29.2 pg (ref 26.0–34.0)
MCHC: 32 g/dL (ref 30.0–36.0)
MCV: 91.3 fL (ref 80.0–100.0)
Platelets: 254 10*3/uL (ref 150–400)
RBC: 4.01 MIL/uL (ref 3.87–5.11)
RDW: 17 % — ABNORMAL HIGH (ref 11.5–15.5)
WBC: 10.9 10*3/uL — ABNORMAL HIGH (ref 4.0–10.5)
nRBC: 0 % (ref 0.0–0.2)

## 2020-01-01 LAB — BRAIN NATRIURETIC PEPTIDE: B Natriuretic Peptide: 1664.3 pg/mL — ABNORMAL HIGH (ref 0.0–100.0)

## 2020-01-01 LAB — ECHOCARDIOGRAM COMPLETE

## 2020-01-01 LAB — TROPONIN I (HIGH SENSITIVITY)
Troponin I (High Sensitivity): 25 ng/L — ABNORMAL HIGH (ref <18)
Troponin I (High Sensitivity): 29 ng/L — ABNORMAL HIGH (ref <18)
Troponin I (High Sensitivity): 25 ng/L — ABNORMAL HIGH (ref <18)

## 2020-01-01 LAB — RESPIRATORY PANEL BY RT PCR (FLU A&B, COVID)
Influenza A by PCR: NEGATIVE
Influenza B by PCR: NEGATIVE
SARS Coronavirus 2 by RT PCR: NEGATIVE

## 2020-01-01 LAB — MAGNESIUM: Magnesium: 2.1 mg/dL (ref 1.7–2.4)

## 2020-01-01 MED ORDER — DIALYVITE 800 0.8 MG PO WAFR: 1.0000 | WAFER | Freq: Every day | ORAL | Status: DC

## 2020-01-01 MED ORDER — CYCLOBENZAPRINE HCL 5 MG PO TABS
5.0000 mg | ORAL_TABLET | Freq: Once | ORAL | Status: AC
  Administered 2020-01-01: 5 mg via ORAL
  Filled 2020-01-01: qty 1

## 2020-01-01 MED ORDER — LACTULOSE 10 GM/15ML PO SOLN
20.0000 g | Freq: Every day | ORAL | Status: DC | PRN
  Filled 2020-01-01: qty 30

## 2020-01-01 MED ORDER — ALLOPURINOL 300 MG PO TABS
150.0000 mg | ORAL_TABLET | Freq: Every day | ORAL | Status: DC
  Administered 2020-01-01: 150 mg via ORAL
  Filled 2020-01-01: qty 0.5

## 2020-01-01 MED ORDER — ONDANSETRON HCL 4 MG/2ML IJ SOLN: 4.0000 mg | Freq: Four times a day (QID) | INTRAMUSCULAR | Status: DC | PRN

## 2020-01-01 MED ORDER — SODIUM CHLORIDE 0.9% FLUSH
3.0000 mL | Freq: Two times a day (BID) | INTRAVENOUS | Status: DC
  Administered 2020-01-01 – 2020-01-02 (×2): 3 mL via INTRAVENOUS

## 2020-01-01 MED ORDER — HYDRALAZINE HCL 25 MG PO TABS: 25.0000 mg | ORAL_TABLET | Freq: Four times a day (QID) | ORAL | Status: DC | PRN

## 2020-01-01 MED ORDER — SODIUM CHLORIDE 0.9% FLUSH
3.0000 mL | Freq: Once | INTRAVENOUS | Status: AC
  Administered 2020-01-01: 3 mL via INTRAVENOUS

## 2020-01-01 MED ORDER — NEPRO/CARBSTEADY PO LIQD
237.0000 mL | Freq: Two times a day (BID) | ORAL | Status: DC
  Administered 2020-01-02: 237 mL via ORAL

## 2020-01-01 MED ORDER — HEPARIN SODIUM (PORCINE) 1000 UNIT/ML DIALYSIS
3000.0000 [IU] | Freq: Once | INTRAMUSCULAR | Status: AC
  Filled 2020-01-01: qty 3

## 2020-01-01 MED ORDER — FERRIC CITRATE 1 GM 210 MG(FE) PO TABS
420.0000 mg | ORAL_TABLET | Freq: Three times a day (TID) | ORAL | Status: DC
  Administered 2020-01-01 – 2020-01-02 (×3): 420 mg via ORAL
  Filled 2020-01-01 (×5): qty 2

## 2020-01-01 MED ORDER — SIMVASTATIN 20 MG PO TABS
20.0000 mg | ORAL_TABLET | Freq: Every day | ORAL | Status: DC
  Administered 2020-01-01: 20 mg via ORAL
  Filled 2020-01-01: qty 1

## 2020-01-01 MED ORDER — ACETAMINOPHEN 500 MG PO TABS: 500.0000 mg | ORAL_TABLET | Freq: Four times a day (QID) | ORAL | Status: DC | PRN

## 2020-01-01 MED ORDER — ZOLPIDEM TARTRATE 5 MG PO TABS: 5.0000 mg | ORAL_TABLET | Freq: Every evening | ORAL | Status: DC | PRN

## 2020-01-01 MED ORDER — RENA-VITE PO TABS
1.0000 | ORAL_TABLET | Freq: Every day | ORAL | Status: DC
  Administered 2020-01-01: 1 via ORAL
  Filled 2020-01-01 (×2): qty 1

## 2020-01-01 MED ORDER — SODIUM CHLORIDE 0.9 % IV SOLN: 250.0000 mL | INTRAVENOUS | Status: DC | PRN

## 2020-01-01 MED ORDER — AMLODIPINE BESYLATE 10 MG PO TABS
10.0000 mg | ORAL_TABLET | Freq: Every day | ORAL | Status: DC
  Administered 2020-01-01: 10 mg via ORAL
  Filled 2020-01-01: qty 1

## 2020-01-01 MED ORDER — LABETALOL HCL 200 MG PO TABS: 200.0000 mg | ORAL_TABLET | Freq: Two times a day (BID) | ORAL | Status: DC

## 2020-01-01 MED ORDER — HEPARIN SODIUM (PORCINE) 1000 UNIT/ML IJ SOLN
INTRAMUSCULAR | Status: AC
  Administered 2020-01-01: 3000 [IU] via INTRAVENOUS_CENTRAL
  Filled 2020-01-01: qty 3

## 2020-01-01 MED ORDER — SODIUM CHLORIDE 0.9% FLUSH: 3.0000 mL | INTRAVENOUS | Status: DC | PRN

## 2020-01-01 MED ORDER — ONDANSETRON HCL 4 MG PO TABS: 8.0000 mg | ORAL_TABLET | Freq: Two times a day (BID) | ORAL | Status: DC | PRN

## 2020-01-01 MED ORDER — LABETALOL HCL 200 MG PO TABS
400.0000 mg | ORAL_TABLET | Freq: Two times a day (BID) | ORAL | Status: DC
  Administered 2020-01-01 – 2020-01-02 (×3): 400 mg via ORAL
  Filled 2020-01-01 (×3): qty 2

## 2020-01-01 MED ORDER — LOPERAMIDE HCL 2 MG PO CAPS
2.0000 mg | ORAL_CAPSULE | ORAL | Status: DC | PRN
  Administered 2020-01-01: 2 mg via ORAL
  Filled 2020-01-01: qty 1

## 2020-01-01 MED ORDER — HEPARIN SODIUM (PORCINE) 5000 UNIT/ML IJ SOLN
5000.0000 [IU] | Freq: Two times a day (BID) | INTRAMUSCULAR | Status: DC
  Administered 2020-01-01 – 2020-01-02 (×3): 5000 [IU] via SUBCUTANEOUS
  Filled 2020-01-01 (×3): qty 1

## 2020-01-01 MED ORDER — ALPRAZOLAM 0.25 MG PO TABS: 0.2500 mg | ORAL_TABLET | Freq: Every day | ORAL | Status: DC | PRN

## 2020-01-01 MED ORDER — CHLORHEXIDINE GLUCONATE CLOTH 2 % EX PADS: 6.0000 | MEDICATED_PAD | Freq: Every day | CUTANEOUS | Status: DC

## 2020-01-01 MED ORDER — LOSARTAN POTASSIUM 50 MG PO TABS
50.0000 mg | ORAL_TABLET | Freq: Every day | ORAL | Status: DC
  Administered 2020-01-01: 50 mg via ORAL
  Filled 2020-01-01: qty 1

## 2020-01-01 MED ORDER — NITROGLYCERIN 2 % TD OINT
0.5000 [in_us] | TOPICAL_OINTMENT | Freq: Once | TRANSDERMAL | Status: AC
  Administered 2020-01-01: 0.5 [in_us] via TOPICAL
  Filled 2020-01-01: qty 1

## 2020-01-01 NOTE — Progress Notes (Signed)
  Echocardiogram 2D Echocardiogram has been performed.  Darlina Sicilian M 01/01/2020, 10:44 AM

## 2020-01-01 NOTE — Consult Note (Signed)
Renal Service Consult Note [facility]  [name] 01/01/2020 [name] Requesting Physician:  Dr Randal Buba  Reason for Consult:  ESRD pt w/ pulm edema HPI: The patient is a 68 y.o. year-old with hx ESRD on HD presenting to ED w/ SOB < 24 hrs, onset last evening on Sunday. +assoc chest pain, no abd pain or n/v/d, no f/c/s. In ED CXR shows pulm edema, pt feeling better on nasal O2. Asked to see for dialysis.   Pt had renal Tx from 1996- 2012, on HD since then.  No prod cough or abd pain.  Has appt coming up soon w/ [facility], the L AVF is still being used but is aneurysmal and apparently they will place new access in the R arm per the patient.  Has not missed HD.    ROS  denies CP  no joint pain   no HA  no blurry vision  no rash  no diarrhea  no nausea/ vomiting   Past Medical History  Past Medical History:  Diagnosis Date  . Adenomatous polyp of colon 10/2010, 2006, 2015  . Anemia in CKD (chronic kidney disease) 11/07/2012   s/p blood transfusion.   . Arthritis   . CAD (coronary artery disease)    "something like that"  . CHF (congestive heart failure) ([place])   . Constipation   . Depression with anxiety   . Diverticula, colon   . 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  Had some early rejection then stable function for years, then had slow decline of function and went back on hemodialysis in 2012.  Gets HD TTS schedule at [facility] on [facility] still using L forearm AVF.     Marland Kitchen GERD (gastroesophageal reflux disease)   . GI bleed 2017   felt to be ischemic colitis, last colo 2015  . Headache   . Hyperlipidemia   . Hypertension   . Neurologic gait dysfunction   . Neuromuscular disorder (HCC)    neuropathy hand and legs  . Osteoporosis   . Pneumonia   . 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 Stroke [facility]) 11/2015   TIA  . Weight  loss, unintentional    Past Surgical History  Past Surgical History:  Procedure Laterality Date  . AV FISTULA PLACEMENT     for dialysis  . AV FISTULA PLACEMENT Left 11/22/2015   Procedure: ARTERIOVENOUS (AV) FISTULA CREATION-LEFT BRACHIOCEPHALIC;  Surgeon: Serafina Mitchell, MD;  Location: [place];  Service: Vascular;  Laterality: Left;  . BACK SURGERY    . CERVICAL FUSION    . CHOLECYSTECTOMY  12/02/2012   Procedure: LAPAROSCOPIC CHOLECYSTECTOMY WITH INTRAOPERATIVE CHOLANGIOGRAM;  Surgeon: Edward Jolly, MD;  Location: [place];  Service: General;  Laterality: N/A;  . EYE SURGERY Bilateral    cataract surgery  . EYE SURGERY Left 2019   laser  . HEMATOMA EVACUATION Left 12/24/2016   Procedure: EVACUATION HEMATOMA LEFT UPPER ARM;  Surgeon: Waynetta Sandy, MD;  Location: [place];  Service: Vascular;  Laterality: Left;  . I & D EXTREMITY Left 12/31/2016   Procedure: IRRIGATION AND DEBRIDEMENT EXTREMITY;  Surgeon: Angelia Mould, MD;  Location: [place];  Service: Vascular;  Laterality: Left;  . INSERTION OF DIALYSIS CATHETER Right 12/24/2016   Procedure: INSERTION OF DIALYSIS CATHETER;  Surgeon: Waynetta Sandy, MD;  Location: [place];  Service: Vascular;  Laterality: Right;  . INSERTION OF DIALYSIS CATHETER Right 02/04/2017   Procedure: INSERTION OF DIALYSIS CATHETER;  Surgeon: Waynetta Sandy, MD;  Location: [place];  Service: Vascular;  Laterality: Right;  . KIDNEY TRANSPLANT  1996  . PERIPHERAL VASCULAR CATHETERIZATION Left 10/23/2016   Procedure: Fistulagram;  Surgeon: Elam Dutch, MD;  Location: [place] CV LAB;  Service: Cardiovascular;  Laterality: Left;  . RESECTION OF ARTERIOVENOUS FISTULA ANEURYSM Left 11/22/2015   Procedure: RESECTION OF LEFT RADIOCEPHALIC FISTULA ANEURYSM ;  Surgeon: Serafina Mitchell, MD;  Location: [place];  Service: Vascular;  Laterality: Left;  . REVISON OF ARTERIOVENOUS FISTULA Left 12/22/2016   Procedure: REVISON OF LEFT  ARTERIOVENOUS FISTULA;  Surgeon: Waynetta Sandy, MD;  Location: [place];  Service: Vascular;  Laterality: Left;  . REVISON OF ARTERIOVENOUS FISTULA Left 02/04/2017   Procedure: REVISON OF LEFT UPPER ARM ARTERIOVENOUS FISTULA;  Surgeon: Waynetta Sandy, MD;  Location: [place];  Service: Vascular;  Laterality: Left;  . UPPER EXTREMITY ANGIOGRAPHY Bilateral 09/19/2019   Procedure: UPPER EXTREMITY ANGIOGRAPHY;  Surgeon: Katha Cabal, MD;  Location: [place] CV LAB;  Service: Cardiovascular;  Laterality: Bilateral;   Family History  Family History  Problem Relation Age of Onset  . Colon cancer Brother   . Cancer Brother   . Coronary artery disease Mother 37  . Hyperlipidemia Mother   . Hypertension Mother   . Stroke Maternal Aunt   . Esophageal cancer Neg Hx   . Stomach cancer Neg Hx   . Rectal cancer Neg Hx    Social History  reports that she quit smoking about 28 years ago. Her smoking use included cigarettes. She has never used smokeless tobacco. She reports that she does not drink alcohol or use drugs. Allergies  Allergies  Allergen Reactions  . Sulfa Antibiotics Other (See Comments)    Both parents allergic-so will not take  . Adhesive [Tape] Itching   Home medications Prior to Admission medications   Medication Sig Start Date End Date Taking? Authorizing Provider  acetaminophen (TYLENOL) 500 MG tablet Take 1 tablet (500 mg total) by mouth every 6 (six) hours as needed. Patient taking differently: Take 500 mg by mouth every 6 (six) hours as needed for mild pain.  09/05/18  Yes Law, Bea Graff, PA-C  allopurinol (ZYLOPRIM) 300 MG tablet Take 150 mg by mouth at bedtime.   Yes [provider]  ALPRAZolam (XANAX) 0.25 MG tablet Take 0.25 mg by mouth daily as needed for anxiety.  10/18/19  Yes [provider]  amLODipine (NORVASC) 10 MG tablet Take 10 mg by mouth at bedtime.  07/19/19  Yes [provider]  AURYXIA 1 GM 210 MG(Fe) tablet  Take 420 mg by mouth 3 (three) times daily with meals.  07/20/19  Yes [provider]  B Complex-C-Folic Acid (DIALYVITE 163) 0.8 MG WAFR Take 1 Wafer by mouth daily. 10/25/18  Yes [provider]  labetalol (NORMODYNE) 200 MG tablet Take 200 mg by mouth 2 (two) times daily.    Yes [provider]  lactulose (CHRONULAC) 10 GM/15ML solution Take 20 g by mouth daily as needed for mild constipation.   Yes [provider]  losartan (COZAAR) 50 MG tablet Take 1 tablet (50 mg total) by mouth at bedtime. 12/14/18  Yes Mariel Aloe, MD  ondansetron (ZOFRAN) 8 MG tablet Take 8 mg by mouth 2 (two) times daily as needed for nausea or vomiting.  01/20/18  Yes [provider]  simvastatin (ZOCOR) 20 MG tablet Take 1 tablet (20 mg total) by mouth at bedtime. 02/26/17  Yes Velvet Bathe, MD  zolpidem (AMBIEN) 10 MG tablet Take 10 mg by mouth at bedtime as needed for sleep.    Yes [provider]   Liver Function Tests No results for input(s): AST, ALT, ALKPHOS, BILITOT, PROT, ALBUMIN in the last 168 hours. No results for input(s): LIPASE, AMYLASE in the last 168 hours. CBC Recent Labs  Lab 01/01/20 0250  WBC 10.9*  HGB 11.7*  HCT 36.6  MCV 91.3  PLT 916   Basic Metabolic Panel Recent Labs  Lab 01/01/20 0250  NA 140  K 3.9  CL 99  CO2 25  GLUCOSE 120*  BUN 46*  CREATININE 9.06*  CALCIUM 9.3   Iron/TIBC/Ferritin/ %Sat    Component Value Date/Time   IRON 61 12/03/2012 0904   TIBC 116 (L) 12/03/2012 0904   FERRITIN 1,708 (H) 12/03/2012 0904   IRONPCTSAT 53 12/03/2012 0904   IRONPCTSAT 10 (L) 07/22/2011 1332    Vitals:   01/01/20 0541 01/01/20 0545 01/01/20 0600 01/01/20 0615  BP: (!) 171/90 (!) 168/83 (!) 161/82 (!) 163/95  Pulse: 85 86 87 88  Resp: 18 17 17  (!) 25  Temp: 97.6 F (36.4 C)     TempSrc: Oral     SpO2: 100% 100% 98% 99%    Exam Gen alert, on nasal O2, mild ^wob No rash, cyanosis or gangrene Sclera anicteric,  throat clear  No jvd or bruits Chest bibasilar rales , no use of acc muscles RRR 2/6 sem no RG Abd soft ntnd no mass or ascites +bs GU defer MS no joint effusions or deformity Ext trace LE edema, no wounds or ulcers Neuro is alert, Ox 3 , nf LUA AVF+ bruit     [facility] HD: [facility]  [facility] 3h 43min 350/1.5  2/2 bath  P4  L AVF  Hep 3000      Assessment/ Plan: 1. SOB/ pulm edema - has not missed HD. HD orders in for 1st shift, possibly 2nd depending on when covid results return.  2. ESRD - MWF HD 3. H/o CVA 4. HTN 5. Anemia ckd    Kelly Splinter  MD 01/01/2020, 6:33 AM

## 2020-01-01 NOTE — ED Notes (Signed)
Breakfast tray ordered 

## 2020-01-01 NOTE — Procedures (Signed)
I have seen and examined this patient and agree with the plan of care  Patient was seen on dialysis   Sherril Croon 01/01/2020, 2:24 PM

## 2020-01-01 NOTE — ED Notes (Signed)
Lunch Tray Ordered @ 1034. 

## 2020-01-01 NOTE — ED Provider Notes (Signed)
[facility] EMERGENCY DEPARTMENT Provider Note   CSN: [id number] Arrival date & time: 01/01/20  0152     History Chief Complaint  Patient presents with  . Shortness of Breath    [name] is a 69 y.o. female.  The history is provided by the patient. The history is limited by the condition of the patient.  Shortness of Breath Severity:  Severe Onset quality:  Gradual Duration:  4 hours Timing:  Constant Progression:  Worsening Chronicity:  Recurrent Context: not URI   Worsened by:  Nothing Ineffective treatments:  None tried Associated symptoms: chest pain   Associated symptoms: no abdominal pain, no cough, no fever, no rash and no sore throat   Risk factors: no recent alcohol use   Risk factors comment:  ESRD Patient ESRD MWF with SOB from home.  Some R sided CP.  No COVID contacts or symptoms known.      Past Medical History:  Diagnosis Date  . Adenomatous polyp of colon 10/2010, 2006, 2015  . Anemia in CKD (chronic kidney disease) 11/07/2012   s/p blood transfusion.   . Arthritis   . CAD (coronary artery disease)    "something like that"  . CHF (congestive heart failure) ([place])   . Constipation   . Depression with anxiety   . Diverticula, colon   . 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  Had some early rejection then stable function for years, then had slow decline of function and went back on hemodialysis in 2012.  Gets HD TTS schedule at [facility] on [facility] still using L forearm AVF.     Marland Kitchen GERD (gastroesophageal reflux disease)   . GI bleed 2017   felt to be ischemic colitis, last colo 2015  . Headache   . Hyperlipidemia   . Hypertension   . Neurologic gait dysfunction   . Neuromuscular disorder (HCC)    neuropathy hand and legs  . Osteoporosis   . Pneumonia   . 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 Stroke  [facility]) 11/2015   TIA  . Weight loss, unintentional     Patient Active Problem List   Diagnosis Date Noted  . Complication of vascular access for dialysis 12/21/2019  . Breakdown (mechanical) of surgically created arteriovenous fistula, initial encounter ([place]) 12/18/2019  . ESRD (end stage renal disease) ([place]) 11/20/2019  . Weakness 10/11/2019  . 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 Dependence on renal dialysis ([place]) 07/24/2019  . Iron deficiency anemia, unspecified 06/09/2019  . Acute on chronic systolic (congestive) heart failure ([place]) 04/17/2019  . Age-related osteoporosis without current pathological fracture 04/17/2019  . 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 Coagulation defect, unspecified ([place]) 04/17/2019  . Diarrhea, unspecified 04/17/2019  . Encounter for screening for depression 04/17/2019  . Headache, unspecified 04/17/2019  . 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 Pain, unspecified 04/17/2019  . Polyp of colon 04/17/2019  . Primary generalized (osteo)arthritis 04/17/2019  . Pruritus, unspecified 04/17/2019  . Pure hypercholesterolemia, unspecified 04/17/2019  . Secondary hyperparathyroidism of renal origin ([place]) 04/17/2019  . Gastro-esophageal reflux disease without esophagitis 04/17/2019  . Essential (primary) hypertension 04/17/2019  . Hyperlipidemia, unspecified 04/17/2019  . Transient cerebral ischemic attack, unspecified 04/17/2019  . Hypoxemia 12/13/2018  . Atrial fibrillation ([place]) 03/23/2017  . Prolonged Q-T interval on ECG 02/24/2017  . Hypokalemia  02/24/2017  . Acute ischemic stroke ([place]) 02/23/2017  . Malnutrition of moderate degree 12/24/2016  . Problem with dialysis access ([place]) 12/21/2016  . Acute ischemic colitis ([place]) 05/16/2016  . Colitis 05/15/2016  . Rectal bleeding 05/15/2016  . Neurologic abnormality 11/19/2015  . Altered mental state 11/19/2015  . Hyperkalemia 11/19/2015  . Anxiety 11/19/2015  .  Insomnia 11/19/2015  . ESRD (end stage renal disease) on dialysis ([place])   . Gait instability   . Stroke-like symptom 11/16/2015  . Vestibular neuritis 11/16/2015  . Stroke (cerebrum) ([place]) 11/16/2015  . Dizziness 05/09/2015  . Ataxia 05/09/2015  . H/O: CVA (cerebrovascular accident) 05/09/2015  . Left facial numbness 05/09/2015  . Left leg numbness 05/09/2015  . Hyperlipidemia   . CHF (congestive heart failure) ([place])   . Congestive heart disease ([place])   . Shortness of breath 04/01/2013  . Volume overload 04/01/2013  . HTN (hypertension) 04/01/2013  . Cholecystitis, acute 11/07/2012  . End stage renal disease ([place]) 11/07/2012  . GERD (gastroesophageal reflux disease) 11/07/2012  . Anemia in chronic kidney disease 11/07/2012    Past Surgical History:  Procedure Laterality Date  . AV FISTULA PLACEMENT     for dialysis  . AV FISTULA PLACEMENT Left 11/22/2015   Procedure: ARTERIOVENOUS (AV) FISTULA CREATION-LEFT BRACHIOCEPHALIC;  Surgeon: Serafina Mitchell, MD;  Location: [place];  Service: Vascular;  Laterality: Left;  . BACK SURGERY    . CERVICAL FUSION    . CHOLECYSTECTOMY  12/02/2012   Procedure: LAPAROSCOPIC CHOLECYSTECTOMY WITH INTRAOPERATIVE CHOLANGIOGRAM;  Surgeon: Edward Jolly, MD;  Location: [place];  Service: General;  Laterality: N/A;  . EYE SURGERY Bilateral    cataract surgery  . EYE SURGERY Left 2019   laser  . HEMATOMA EVACUATION Left 12/24/2016   Procedure: EVACUATION HEMATOMA LEFT UPPER ARM;  Surgeon: Waynetta Sandy, MD;  Location: [place];  Service: Vascular;  Laterality: Left;  . I & D EXTREMITY Left 12/31/2016   Procedure: IRRIGATION AND DEBRIDEMENT EXTREMITY;  Surgeon: Angelia Mould, MD;  Location: [place];  Service: Vascular;  Laterality: Left;  . INSERTION OF DIALYSIS CATHETER Right 12/24/2016   Procedure: INSERTION OF DIALYSIS CATHETER;  Surgeon: Waynetta Sandy, MD;  Location: [place];  Service: Vascular;  Laterality: Right;  .  INSERTION OF DIALYSIS CATHETER Right 02/04/2017   Procedure: INSERTION OF DIALYSIS CATHETER;  Surgeon: Waynetta Sandy, MD;  Location: [place];  Service: Vascular;  Laterality: Right;  . KIDNEY TRANSPLANT  1996  . PERIPHERAL VASCULAR CATHETERIZATION Left 10/23/2016   Procedure: Fistulagram;  Surgeon: Elam Dutch, MD;  Location: [place] CV LAB;  Service: Cardiovascular;  Laterality: Left;  . RESECTION OF ARTERIOVENOUS FISTULA ANEURYSM Left 11/22/2015   Procedure: RESECTION OF LEFT RADIOCEPHALIC FISTULA ANEURYSM ;  Surgeon: Serafina Mitchell, MD;  Location: [place];  Service: Vascular;  Laterality: Left;  . REVISON OF ARTERIOVENOUS FISTULA Left 12/22/2016   Procedure: REVISON OF LEFT ARTERIOVENOUS FISTULA;  Surgeon: Waynetta Sandy, MD;  Location: [place];  Service: Vascular;  Laterality: Left;  . REVISON OF ARTERIOVENOUS FISTULA Left 02/04/2017   Procedure: REVISON OF LEFT UPPER ARM ARTERIOVENOUS FISTULA;  Surgeon: Waynetta Sandy, MD;  Location: [place];  Service: Vascular;  Laterality: Left;  . UPPER EXTREMITY ANGIOGRAPHY Bilateral 09/19/2019   Procedure: UPPER EXTREMITY ANGIOGRAPHY;  Surgeon: Katha Cabal, MD;  Location: [place] [facility];  Service: Cardiovascular;  Laterality: Bilateral;     OB History   No obstetric history on  file.     Family History  Problem Relation Age of Onset  . Colon cancer Brother   . Cancer Brother   . Coronary artery disease Mother 78  . Hyperlipidemia Mother   . Hypertension Mother   . Stroke Maternal Aunt   . Esophageal cancer Neg Hx   . Stomach cancer Neg Hx   . Rectal cancer Neg Hx     Social History   Tobacco Use  . Smoking status: Former Smoker    Types: Cigarettes    Quit date: 12/31/1991    Years since quitting: 28.0  . Smokeless tobacco: Never Used  Substance Use Topics  . Alcohol use: No    Alcohol/week: 0.0 standard drinks  . Drug use: No    Home Medications Prior to Admission medications     Medication Sig Start Date End Date Taking? Authorizing Provider  acetaminophen (TYLENOL) 500 MG tablet Take 1 tablet (500 mg total) by mouth every 6 (six) hours as needed. Patient taking differently: Take 500 mg by mouth every 6 (six) hours as needed for mild pain.  09/05/18   Law, Bea Graff, PA-C  allopurinol (ZYLOPRIM) 300 MG tablet Take 150 mg by mouth at bedtime.    [provider]  ALPRAZolam Duanne Moron) 0.25 MG tablet Take 0.25 mg by mouth daily as needed. 10/18/19   [provider]  amLODipine (NORVASC) 10 MG tablet Take 10 mg by mouth at bedtime.  07/19/19   [provider]  AURYXIA 1 GM 210 MG(Fe) tablet Take 420 mg by mouth 3 (three) times daily with meals.  07/20/19   [provider]  B Complex-C-Folic Acid (DIALYVITE 326) 0.8 MG WAFR Take 1 Wafer by mouth daily. 10/25/18   [provider]  labetalol (NORMODYNE) 200 MG tablet Take 200 mg by mouth 2 (two) times daily.     [provider]  lactulose (CHRONULAC) 10 GM/15ML solution Take 20 g by mouth daily as needed for mild constipation.    [provider]  losartan (COZAAR) 50 MG tablet Take 1 tablet (50 mg total) by mouth at bedtime. 12/14/18   Mariel Aloe, MD  ondansetron (ZOFRAN) 8 MG tablet Take 8 mg by mouth 2 (two) times daily as needed for nausea or vomiting.  01/20/18   [provider]  simvastatin (ZOCOR) 20 MG tablet Take 1 tablet (20 mg total) by mouth at bedtime. 02/26/17   Velvet Bathe, MD  zolpidem (AMBIEN) 10 MG tablet Take 10 mg by mouth at bedtime as needed for sleep.     [provider]    Allergies    Sulfa antibiotics and Adhesive [tape]  Review of Systems   Review of Systems  Constitutional: Negative for fever.  HENT: Negative for sore throat.   Eyes: Negative for visual disturbance.  Respiratory: Positive for shortness of breath. Negative for cough.   Cardiovascular: Positive for chest pain.  Gastrointestinal: Negative for abdominal  pain.  Genitourinary: Negative for flank pain.  Musculoskeletal: Negative for arthralgias.  Skin: Negative for rash.  Neurological: Negative for dizziness.  Psychiatric/Behavioral: Negative for agitation.  All other systems reviewed and are negative.   Physical Exam Updated Vital Signs BP (!) 171/90   Pulse 85   Temp 97.6 F (36.4 C) (Oral)   Resp 18   SpO2 100%   Physical Exam Vitals and nursing note reviewed.  Constitutional:      Appearance: Normal appearance.  HENT:     Head: Normocephalic and atraumatic.  Nose: Nose normal.  Eyes:     Conjunctiva/sclera: Conjunctivae normal.     Pupils: Pupils are equal, round, and reactive to light.  Cardiovascular:     Rate and Rhythm: Normal rate and regular rhythm.     Pulses: Normal pulses.     Heart sounds: Normal heart sounds.  Pulmonary:     Effort: Pulmonary effort is normal.     Breath sounds: Normal breath sounds.  Abdominal:     General: Abdomen is flat. Bowel sounds are normal.     Tenderness: There is no abdominal tenderness. There is no guarding or rebound.     Hernia: No hernia is present.  Musculoskeletal:     Cervical back: Normal range of motion and neck supple.  Skin:    General: Skin is warm and dry.     Capillary Refill: Capillary refill takes less than 2 seconds.  Neurological:     General: No focal deficit present.     Mental Status: She is alert and oriented to person, place, and time.     Deep Tendon Reflexes: Reflexes normal.  Psychiatric:        Mood and Affect: Mood normal.        Behavior: Behavior normal.     ED Results / Procedures / Treatments   Labs (all labs ordered are listed, but only abnormal results are displayed) Results for orders placed or performed during the hospital encounter of 09/32/35  Basic metabolic panel  Result Value Ref Range   Sodium 140 135 - 145 mmol/L   Potassium 3.9 3.5 - 5.1 mmol/L   Chloride 99 98 - 111 mmol/L   CO2 25 22 - 32 mmol/L   Glucose, Bld 120  (H) 70 - 99 mg/dL   BUN 46 (H) 8 - 23 mg/dL   Creatinine, Ser 9.06 (H) 0.44 - 1.00 mg/dL   Calcium 9.3 8.9 - 10.3 mg/dL   GFR calc non Af Amer 4 (L) >60 mL/min   GFR calc Af Amer 5 (L) >60 mL/min   Anion gap 16 (H) 5 - 15  CBC  Result Value Ref Range   WBC 10.9 (H) 4.0 - 10.5 K/uL   RBC 4.01 3.87 - 5.11 MIL/uL   Hemoglobin 11.7 (L) 12.0 - 15.0 g/dL   HCT 36.6 36.0 - 46.0 %   MCV 91.3 80.0 - 100.0 fL   MCH 29.2 26.0 - 34.0 pg   MCHC 32.0 30.0 - 36.0 g/dL   RDW 17.0 (H) 11.5 - 15.5 %   Platelets 254 150 - 400 K/uL   nRBC 0.0 0.0 - 0.2 %  Troponin I (High Sensitivity)  Result Value Ref Range   Troponin I (High Sensitivity) 25 (H) <18 ng/L   DG Chest 2 View  Result Date: 01/01/2020 CLINICAL DATA:  Chest pain. Shortness of breath. Dialysis patient. EXAM: CHEST - 2 VIEW COMPARISON:  Radiograph and CT 11/20/2019 FINDINGS: Stable cardiomegaly. Aortic atherosclerosis. Worsening pulmonary edema from prior exam. Small bilateral pleural effusions. No confluent airspace disease. No pneumothorax. No acute osseous abnormalities are seen. IMPRESSION: CHF, worsened since December. Electronically Signed   By: Keith Rake M.D.   On: 01/01/2020 02:40    EKG EKG Interpretation  Date/Time:  Monday January 01 2020 05:41:53 EST Ventricular Rate:  85 PR Interval:  162 QRS Duration: 87 QT Interval:  419 QTC Calculation: 499 R Axis:   80 Text Interpretation: Sinus rhythm Atrial premature complex Consider left ventricular hypertrophy Confirmed by Randal Buba, [place] ([id number]) on  01/01/2020 5:44:26 AM   Radiology DG Chest 2 View  Result Date: 01/01/2020 CLINICAL DATA:  Chest pain. Shortness of breath. Dialysis patient. EXAM: CHEST - 2 VIEW COMPARISON:  Radiograph and CT 11/20/2019 FINDINGS: Stable cardiomegaly. Aortic atherosclerosis. Worsening pulmonary edema from prior exam. Small bilateral pleural effusions. No confluent airspace disease. No pneumothorax. No acute osseous abnormalities are seen.  IMPRESSION: CHF, worsened since December. Electronically Signed   By: Keith Rake M.D.   On: 01/01/2020 02:40    Procedures Procedures (including critical care time)  Medications Ordered in ED Medications  nitroGLYCERIN (NITROGLYN) 2 % ointment 0.5 inch (has no administration in time range)  sodium chloride flush (NS) 0.9 % injection 3 mL (3 mLs Intravenous Given 01/01/20 0539)    ED Course  I have reviewed the triage vital signs and the nursing notes.  Pertinent labs & imaging results that were available during my care of the patient were reviewed by me and considered in my medical decision making (see chart for details).    Case d/w Dr. Jonnie Finner, admit to medicine.  Patient will be dialyzed this am Final Clinical Impression(s) / ED Diagnoses Pulmonary edema admit to medicine    Lamarion Mcevers, MD 01/01/20 [phone]

## 2020-01-01 NOTE — ED Triage Notes (Addendum)
The pt arrived by gems from home  She has been having sob  For 2-3 hours gems placed her on nasal 02  And removed as she arrived because her sats were 98% on  ra  Dialysis pt that is due for dialysis later today   Hypertensive  Dialysis fistula in her lt arm

## 2020-01-01 NOTE — ED Triage Notes (Signed)
The pt reports that she has had chest pain since yesterday ntermittently  C/o chest pain now

## 2020-01-01 NOTE — H&P (Signed)
History and Physical    [name] YYT:[id number] DOB: November 25, 1951 DOA: 01/01/2020  PCP: [name], MD   Patient coming from: Home  I have personally briefly reviewed patient's old medical records in [place]  Chief Complaint: SOB  HPI: [name] is a 69 y.o. female with medical history significant of ESRD on HD presenting to ED w/ SOB < 24 hrs, onset last evening on Sunday.  Associated with chest pain, on the right side of the chest, aching like 4-5/10, non-irradiating, each episode lasts for few hours, no exacerbating or relieving factors no abd pain or n/v/d, no f/c/s.  Pt had renal Tx from 1996- 2012, on HD since then.  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    Her last dialysis was last Friday.  ED Course:  CXR shows pulm edema, pt feeling better on nasal O2.  Nephrologist was contacted for dialysis this morning  Review of Systems: As per HPI otherwise 10 point review of systems negative.    Past Medical History:  Diagnosis Date  . Adenomatous polyp of colon 10/2010, 2006, 2015  . Anemia in CKD (chronic kidney disease) 11/07/2012   s/p blood transfusion.   . Arthritis   . CAD (coronary artery disease)    "something like that"  . CHF (congestive heart failure) ([place])   . Constipation   . Depression with anxiety   . Diverticula, colon   . 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  Had some early rejection then stable function for years, then had slow decline of function and went back on hemodialysis in 2012.  Gets HD TTS schedule at [facility] still using L forearm AVF.     Marland Kitchen GERD (gastroesophageal reflux disease)   . GI bleed 2017   felt to be ischemic colitis, last colo 2015    . Headache   . Hyperlipidemia   . Hypertension   . Neurologic gait dysfunction   . Neuromuscular disorder (HCC)    neuropathy hand and legs  . Osteoporosis   . Pneumonia   . 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 Stroke [facility]) 11/2015   TIA  . Weight loss, unintentional     Past Surgical History:  Procedure Laterality Date  . AV FISTULA PLACEMENT     for dialysis  . AV FISTULA PLACEMENT Left 11/22/2015   Procedure: ARTERIOVENOUS (AV) FISTULA CREATION-LEFT BRACHIOCEPHALIC;  Surgeon: Serafina Mitchell, MD;  Location: [place];  Service: Vascular;  Laterality: Left;  . BACK SURGERY    . CERVICAL FUSION    . CHOLECYSTECTOMY  12/02/2012   Procedure: LAPAROSCOPIC CHOLECYSTECTOMY WITH INTRAOPERATIVE CHOLANGIOGRAM;  Surgeon: Edward Jolly, MD;  Location: [place];  Service: General;  Laterality: N/A;  . EYE SURGERY Bilateral    cataract surgery  . EYE SURGERY Left 2019   laser  . HEMATOMA EVACUATION Left 12/24/2016   Procedure: EVACUATION HEMATOMA LEFT UPPER ARM;  Surgeon: Waynetta Sandy, MD;  Location: [place];  Service: Vascular;  Laterality: Left;  . I & D EXTREMITY Left 12/31/2016   Procedure: IRRIGATION AND DEBRIDEMENT EXTREMITY;  Surgeon: Angelia Mould, MD;  Location: [place];  Service: Vascular;  Laterality: Left;  . INSERTION OF DIALYSIS CATHETER Right 12/24/2016   Procedure: INSERTION OF DIALYSIS CATHETER;  Surgeon: Waynetta Sandy, MD;  Location: [place];  Service: Vascular;  Laterality: Right;  . INSERTION OF DIALYSIS CATHETER Right 02/04/2017   Procedure: INSERTION OF DIALYSIS CATHETER;  Surgeon: Waynetta Sandy, MD;  Location: [place];  Service: Vascular;  Laterality: Right;  . KIDNEY TRANSPLANT  1996  . PERIPHERAL VASCULAR CATHETERIZATION Left 10/23/2016   Procedure: Fistulagram;  Surgeon: Elam Dutch, MD;  Location: [place] CV [facility];  Service: Cardiovascular;  Laterality: Left;  . RESECTION OF ARTERIOVENOUS FISTULA  ANEURYSM Left 11/22/2015   Procedure: RESECTION OF LEFT RADIOCEPHALIC FISTULA ANEURYSM ;  Surgeon: Serafina Mitchell, MD;  Location: [place];  Service: Vascular;  Laterality: Left;  . REVISON OF ARTERIOVENOUS FISTULA Left 12/22/2016   Procedure: REVISON OF LEFT ARTERIOVENOUS FISTULA;  Surgeon: Waynetta Sandy, MD;  Location: [place];  Service: Vascular;  Laterality: Left;  . REVISON OF ARTERIOVENOUS FISTULA Left 02/04/2017   Procedure: REVISON OF LEFT UPPER ARM ARTERIOVENOUS FISTULA;  Surgeon: Waynetta Sandy, MD;  Location: [place];  Service: Vascular;  Laterality: Left;  . UPPER EXTREMITY ANGIOGRAPHY Bilateral 09/19/2019   Procedure: UPPER EXTREMITY ANGIOGRAPHY;  Surgeon: Katha Cabal, MD;  Location: [place] [facility];  Service: Cardiovascular;  Laterality: Bilateral;     reports that she quit smoking about 28 years ago. Her smoking use included cigarettes. She has never used smokeless tobacco. She reports that she does not drink alcohol or use drugs.  Allergies  Allergen Reactions  . Sulfa Antibiotics Other (See Comments)    Both parents allergic-so will not take  . Adhesive [Tape] Itching    Family History  Problem Relation Age of Onset  . Colon cancer Brother   . Cancer Brother   . Coronary artery disease Mother 25  . Hyperlipidemia Mother   . Hypertension Mother   . Stroke Maternal Aunt   . Esophageal cancer Neg Hx   . Stomach cancer Neg Hx   . Rectal cancer Neg Hx      Prior to Admission medications   Medication Sig Start Date End Date Taking? Authorizing Provider  acetaminophen (TYLENOL) 500 MG tablet Take 1 tablet (500 mg total) by mouth every 6 (six) hours as needed. Patient taking differently: Take 500 mg by mouth every 6 (six) hours as needed for mild pain.  09/05/18  Yes Law, Bea Graff, PA-C  allopurinol (ZYLOPRIM) 300 MG tablet Take 150 mg by mouth at bedtime.   Yes [provider]  ALPRAZolam (XANAX) 0.25 MG tablet Take 0.25 mg by mouth  daily as needed for anxiety.  10/18/19  Yes [provider]  amLODipine (NORVASC) 10 MG tablet Take 10 mg by mouth at bedtime.  07/19/19  Yes [provider]  AURYXIA 1 GM 210 MG(Fe) tablet Take 420 mg by mouth 3 (three) times daily with meals.  07/20/19  Yes [provider]  B Complex-C-Folic Acid (DIALYVITE 409) 0.8 MG WAFR Take 1 Wafer by mouth daily. 10/25/18  Yes [provider]  labetalol (NORMODYNE) 200 MG tablet Take 200 mg by mouth 2 (two) times daily.    Yes [provider]  lactulose (CHRONULAC) 10 GM/15ML solution Take 20 g by mouth daily as needed for mild constipation.   Yes [provider]  losartan (COZAAR) 50 MG tablet Take 1 tablet (50 mg total) by mouth at bedtime. 12/14/18  Yes Nettey,  Evalee Jefferson, MD  ondansetron (ZOFRAN) 8 MG tablet Take 8 mg by mouth 2 (two) times daily as needed for nausea or vomiting.  01/20/18  Yes [provider]  simvastatin (ZOCOR) 20 MG tablet Take 1 tablet (20 mg total) by mouth at bedtime. 02/26/17  Yes Velvet Bathe, MD  zolpidem (AMBIEN) 10 MG tablet Take 10 mg by mouth at bedtime as needed for sleep.    Yes [provider]    Physical Exam: Vitals:   01/01/20 0915 01/01/20 0930 01/01/20 0945 01/01/20 1000  BP: (!) 167/91 (!) 162/82 (!) 170/90 (!) 167/86  Pulse: 85 80 86 83  Resp: (!) 21 13 17 17   Temp:      TempSrc:      SpO2: 99% 98% 98% 99%    Constitutional: NAD, calm, comfortable Vitals:   01/01/20 0915 01/01/20 0930 01/01/20 0945 01/01/20 1000  BP: (!) 167/91 (!) 162/82 (!) 170/90 (!) 167/86  Pulse: 85 80 86 83  Resp: (!) 21 13 17 17   Temp:      TempSrc:      SpO2: 99% 98% 98% 99%   Eyes: PERRL, lids and conjunctivae normal ENMT: Mucous membranes are moist. Posterior pharynx clear of any exudate or lesions.Normal dentition.  Neck: normal, supple, no masses, no thyromegaly Respiratory: Bilateral fine crackles on bases. Normal respiratory effort. No accessory muscle  use.  Cardiovascular: Regular rate and rhythm, no murmurs / rubs / gallops. No extremity edema. 2+ pedal pulses. No carotid bruits.  Abdomen: no tenderness, no masses palpated. No hepatosplenomegaly. Bowel sounds positive.  Musculoskeletal: no clubbing / cyanosis. No joint deformity upper and lower extremities. Good ROM, no contractures. Normal muscle tone.  Skin: no rashes, lesions, ulcers. No induration Neurologic: CN 2-12 grossly intact. Sensation intact, DTR normal. Strength 5/5 in all 4.  Psychiatric: Normal judgment and insight. Alert and oriented x 3. Normal mood.     Labs on Admission: I have personally reviewed following labs and imaging studies  CBC: Recent Labs  Lab 01/01/20 0250  WBC 10.9*  HGB 11.7*  HCT 36.6  MCV 91.3  PLT 355   Basic Metabolic Panel: Recent Labs  Lab 01/01/20 0250  NA 140  K 3.9  CL 99  CO2 25  GLUCOSE 120*  BUN 46*  CREATININE 9.06*  CALCIUM 9.3   GFR: Estimated Creatinine Clearance: 4.6 mL/min (A) (by C-G formula based on SCr of 9.06 mg/dL (H)). Liver Function Tests: No results for input(s): AST, ALT, ALKPHOS, BILITOT, PROT, ALBUMIN in the last 168 hours. No results for input(s): LIPASE, AMYLASE in the last 168 hours. No results for input(s): AMMONIA in the last 168 hours. Coagulation Profile: No results for input(s): INR, PROTIME in the last 168 hours. Cardiac Enzymes: No results for input(s): CKTOTAL, CKMB, CKMBINDEX, TROPONINI in the last 168 hours. BNP (last 3 results) No results for input(s): PROBNP in the last 8760 hours. HbA1C: No results for input(s): HGBA1C in the last 72 hours. CBG: No results for input(s): GLUCAP in the last 168 hours. Lipid Profile: No results for input(s): CHOL, HDL, LDLCALC, TRIG, CHOLHDL, LDLDIRECT in the last 72 hours. Thyroid Function Tests: No results for input(s): TSH, T4TOTAL, FREET4, T3FREE, THYROIDAB in the last 72 hours. Anemia Panel: No results for input(s): VITAMINB12, FOLATE, FERRITIN,  TIBC, IRON, RETICCTPCT in the last 72 hours. Urine analysis:    Component Value Date/Time   COLORURINE YELLOW 01/30/2011 1648   APPEARANCEUR CLOUDY (A) 01/30/2011 1648   LABSPEC 1.016 01/30/2011 1648  PHURINE 5.0 01/30/2011 1648   GLUCOSEU NEGATIVE 10/14/2009 0747   HGBUR SMALL (A) 01/30/2011 1648   BILIRUBINUR NEGATIVE 01/30/2011 1648   KETONESUR NEGATIVE 01/30/2011 1648   PROTEINUR 100 (A) 01/30/2011 1648   UROBILINOGEN 0.2 01/30/2011 1648   NITRITE NEGATIVE 01/30/2011 1648   LEUKOCYTESUR SMALL (A) 01/30/2011 1648    Radiological Exams on Admission: DG Chest 2 View  Result Date: 01/01/2020 CLINICAL DATA:  Chest pain. Shortness of breath. Dialysis patient. EXAM: CHEST - 2 VIEW COMPARISON:  Radiograph and CT 11/20/2019 FINDINGS: Stable cardiomegaly. Aortic atherosclerosis. Worsening pulmonary edema from prior exam. Small bilateral pleural effusions. No confluent airspace disease. No pneumothorax. No acute osseous abnormalities are seen. IMPRESSION: CHF, worsened since December. Electronically Signed   By: Keith Rake M.D.   On: 01/01/2020 02:40    EKG: Independently reviewed.  Nonspecific ST-T changes, long QTC.  Assessment/Plan Active Problems:   CHF (congestive heart failure) (HCC)   Pulmonary edema  Acute diastolic CHF decompensation, with signs of pulmonary edema, suspect chronically blood pressure not very well controlled, titrate up her labetalol, also and Imdur, continue hydralazine and ARB.  Echo done result pending.  HD as scheduled.  Dyspnea, unclear CHF decompensation, also her most recent echo was 2 years ago showed moderate pulmonary hypertension, repeat echo pending today.  PE was ruled out last month.  May need a referral to see a pulmonary hypertension specialist as outpatient.  Chest pain, EKG showed nonspecific ST-T changes, first troponin borderline elevated, cycle troponins to rule out ACS may need outpatient stress test.  ESRD, continue her regular  schedule of HD.  Recently has some issue with left arm AV shunt, vascular surgeon is working on the right arm venous mapping to establish a new access.  Uncontrolled hypertension, as above.   DVT prophylaxis: Heparin subcu Code Status: Full code Family Communication: None at bedside Disposition Plan: Home Consults called: Nephrology Admission status: Telemetry admission   Lequita Halt MD [facility] Pager [phone]  If 7PM-7AM, please contact night-coverage [email or web address] Password [facility]  01/01/2020, 12:10 PM

## 2020-01-02 ENCOUNTER — Ambulatory Visit (INDEPENDENT_AMBULATORY_CARE_PROVIDER_SITE_OTHER): Payer: Medicare Other | Admitting: Nurse Practitioner

## 2020-01-02 ENCOUNTER — Encounter (INDEPENDENT_AMBULATORY_CARE_PROVIDER_SITE_OTHER): Payer: Medicare Other

## 2020-01-02 ENCOUNTER — Other Ambulatory Visit (INDEPENDENT_AMBULATORY_CARE_PROVIDER_SITE_OTHER): Payer: Medicare Other

## 2020-01-02 DIAGNOSIS — N186 End stage renal disease: Secondary | ICD-10-CM | POA: Diagnosis not present

## 2020-01-02 DIAGNOSIS — D631 Anemia in chronic kidney disease: Secondary | ICD-10-CM | POA: Diagnosis not present

## 2020-01-02 DIAGNOSIS — I120 Hypertensive chronic kidney disease with stage 5 chronic kidney disease or end stage renal disease: Secondary | ICD-10-CM | POA: Diagnosis not present

## 2020-01-02 DIAGNOSIS — J810 Acute pulmonary edema: Secondary | ICD-10-CM | POA: Diagnosis not present

## 2020-01-02 DIAGNOSIS — I5033 Acute on chronic diastolic (congestive) heart failure: Secondary | ICD-10-CM | POA: Diagnosis not present

## 2020-01-02 DIAGNOSIS — I132 Hypertensive heart and chronic kidney disease with heart failure and with stage 5 chronic kidney disease, or end stage renal disease: Secondary | ICD-10-CM | POA: Diagnosis not present

## 2020-01-02 DIAGNOSIS — Z992 Dependence on renal dialysis: Secondary | ICD-10-CM | POA: Diagnosis not present

## 2020-01-02 DIAGNOSIS — R0602 Shortness of breath: Secondary | ICD-10-CM | POA: Diagnosis not present

## 2020-01-02 LAB — BASIC METABOLIC PANEL
Anion gap: 15 (ref 5–15)
BUN: 24 mg/dL — ABNORMAL HIGH (ref 8–23)
CO2: 26 mmol/L (ref 22–32)
Calcium: 9.1 mg/dL (ref 8.9–10.3)
Chloride: 95 mmol/L — ABNORMAL LOW (ref 98–111)
Creatinine, Ser: 5.57 mg/dL — ABNORMAL HIGH (ref 0.44–1.00)
GFR calc Af Amer: 8 mL/min — ABNORMAL LOW (ref >60)
GFR calc non Af Amer: 7 mL/min — ABNORMAL LOW (ref >60)
Glucose, Bld: 94 mg/dL (ref 70–99)
Potassium: 4 mmol/L (ref 3.5–5.1)
Sodium: 136 mmol/L (ref 135–145)

## 2020-01-02 LAB — PHOSPHORUS: Phosphorus: 4.8 mg/dL — ABNORMAL HIGH (ref 2.5–4.6)

## 2020-01-02 LAB — HIV ANTIBODY (ROUTINE TESTING W REFLEX): HIV Screen 4th Generation wRfx: NONREACTIVE

## 2020-01-02 MED ORDER — CHLORHEXIDINE GLUCONATE CLOTH 2 % EX PADS: 6.0000 | MEDICATED_PAD | Freq: Every day | CUTANEOUS | Status: DC

## 2020-01-02 NOTE — Care Management CC44 (Signed)
Condition Code 44 Documentation Completed  Patient Details  Name: [name] MRN: [id number] Date of Birth: [birth year]   Condition Code 44 given:  Yes Patient signature on Condition Code 44 notice:  Yes Documentation of 2 MD's agreement:  Yes Code 44 added to claim:  Yes    Zenon Mayo, RN 01/02/2020, 1:08 PM

## 2020-01-02 NOTE — Discharge Summary (Signed)
Physician Discharge Summary  [name] DGL:[id number] DOB: 10/23/1951 DOA: 01/01/2020  PCP: [name], MD  Admit date: 01/01/2020 Discharge date: 01/02/2020  Admitted From: Home Disposition:  Home with home health   Recommendations for Outpatient Follow-up:  1. Follow up with PCP in 1 week 2. HD MWF as scheduled   Discharge Condition: Stable CODE STATUS: Full  Diet recommendation: Renal diet   Brief/Interim Summary: HPI per Dr. Roosevelt Locks: [name] is a 69 y.o. female with medical history significant of ESRD on HD presenting to ED w/ SOB <24 hrs, onset last evening on Sunday.  Associated with chest pain, on the right side of the chest, aching like 4-5/10, non-irradiating, each episode lasts for few hours, no exacerbating or relieving factors no abd pain or n/v/d, no f/c/s. Pt had renal transplant from 1996- 2012, on HD since then. 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  Her last dialysis was last Friday.   ED Course:  CXR shows pulm edema, pt feeling better on nasal O2.  Nephrologist was contacted for dialysis this morning  Interim: Patient underwent HD on 1/18 with clinical improvement. On day of discharge, she was feeling much better, remained stable on room air and wished to be discharged home. PT recommended home health PT.    Discharge Diagnoses:  Principal Problem:   Acute on chronic diastolic CHF (congestive heart failure) (HCC) Active Problems:   End stage renal disease (HCC)   Shortness of breath   Volume overload   HTN (hypertension)   ESRD (end stage renal disease) on dialysis [facility])   Pulmonary edema     Discharge Instructions  Discharge Instructions    Call MD for:  difficulty breathing, headache or visual disturbances   Complete by: As directed    Call MD for:  extreme  fatigue   Complete by: As directed    Call MD for:  persistant dizziness or light-headedness   Complete by: As directed    Call MD for:  persistant nausea and vomiting   Complete by: As directed    Call MD for:  severe uncontrolled pain   Complete by: As directed    Call MD for:  temperature >100.4   Complete by: As directed    Discharge instructions   Complete by: As directed    You were cared for by a hospitalist during your hospital stay. If you have any questions about your discharge medications or the care you received while you were in the hospital after you are discharged, you can call the unit and ask to speak with the hospitalist on call if the hospitalist that took care of you is not available. Once you are discharged, your primary care physician will handle any further medical issues. Please note that NO REFILLS for any discharge medications will be authorized once you are discharged, as it is imperative that you return to your primary care physician (or establish a relationship with a primary care physician if you do not have one) for your aftercare needs so that they can reassess your need for medications and monitor your lab values.   Increase activity slowly   Complete by: As directed      Allergies as of 01/02/2020      Reactions   Sulfa Antibiotics Other (See Comments)   Both  parents allergic-so will not take   Adhesive [tape] Itching      Medication List    TAKE these medications   acetaminophen 500 MG tablet Commonly known as: TYLENOL Take 1 tablet (500 mg total) by mouth every 6 (six) hours as needed. What changed: reasons to take this   allopurinol 300 MG tablet Commonly known as: ZYLOPRIM Take 150 mg by mouth at bedtime.   ALPRAZolam 0.25 MG tablet Commonly known as: XANAX Take 0.25 mg by mouth daily as needed for anxiety.   amLODipine 10 MG tablet Commonly known as: NORVASC Take 10 mg by mouth at bedtime.   Auryxia 1 GM 210 MG(Fe) tablet Generic  drug: ferric citrate Take 420 mg by mouth 3 (three) times daily with meals.   Dialyvite 800 0.8 MG Wafr Take 1 Wafer by mouth daily.   labetalol 200 MG tablet Commonly known as: NORMODYNE Take 200 mg by mouth 2 (two) times daily.   lactulose 10 GM/15ML solution Commonly known as: CHRONULAC Take 20 g by mouth daily as needed for mild constipation.   losartan 50 MG tablet Commonly known as: COZAAR Take 1 tablet (50 mg total) by mouth at bedtime.   ondansetron 8 MG tablet Commonly known as: ZOFRAN Take 8 mg by mouth 2 (two) times daily as needed for nausea or vomiting.   simvastatin 20 MG tablet Commonly known as: ZOCOR Take 1 tablet (20 mg total) by mouth at bedtime.   zolpidem 10 MG tablet Commonly known as: AMBIEN Take 10 mg by mouth at bedtime as needed for sleep.      Follow-up Information    [name], MD.   Specialty: Internal Medicine Why: Please follow up in a week Contact information: [address] [place]  [address] [phone]          Allergies  Allergen Reactions  . Sulfa Antibiotics Other (See Comments)    Both parents allergic-so will not take  . Adhesive [Tape] Itching    Consultations:  Nephrology    Procedures/Studies: DG Chest 2 View  Result Date: 01/01/2020 CLINICAL DATA:  Chest pain. Shortness of breath. Dialysis patient. EXAM: CHEST - 2 VIEW COMPARISON:  Radiograph and CT 11/20/2019 FINDINGS: Stable cardiomegaly. Aortic atherosclerosis. Worsening pulmonary edema from prior exam. Small bilateral pleural effusions. No confluent airspace disease. No pneumothorax. No acute osseous abnormalities are seen. IMPRESSION: CHF, worsened since December. Electronically Signed   By: Keith Rake M.D.   On: 01/01/2020 02:40   ECHOCARDIOGRAM COMPLETE  Result Date: 01/01/2020   ECHOCARDIOGRAM REPORT   Patient Name:   [name] Date of Exam: 01/01/2020 Medical Rec #:  [id number]         Height:       62.0 in  Accession #:    [id number]        Weight:       115.0 lb Date of Birth:  12-31-1950         BSA:          1.51 m Patient Age:    69 years          BP:           167/86 mmHg Patient Gender: F                 HR:           83 bpm. Exam Location:  Inpatient Procedure: 2D Echo Indications:    CHF-Acute Diastolic 025.42 / H06.23  History:  Patient has prior history of Echocardiogram examinations, most                 recent 02/24/2017. CAD; Risk Factors:Hypertension and                 Dyslipidemia. End stage renal disease, Anemia.  Sonographer:    Darlina Sicilian RDCS Referring Phys: [id number] [place]  1. Left ventricular ejection fraction, by visual estimation, is 60 to 65%. The left ventricle has normal function. There is mildly increased left ventricular hypertrophy.  2. Left ventricular diastolic parameters are consistent with Grade II diastolic dysfunction (pseudonormalization).  3. The left ventricle has no regional wall motion abnormalities.  4. Global right ventricle has normal systolic function.The right ventricular size is normal. No increase in right ventricular wall thickness.  5. Left atrial size was mildly dilated.  6. Right atrial size was mildly dilated.  7. Mild mitral annular calcification.  8. The mitral valve is normal in structure. Mild mitral valve regurgitation. No evidence of mitral stenosis.  9. The tricuspid valve is normal in structure. Tricuspid valve regurgitation is moderate. 10. The aortic valve is tricuspid. Aortic valve regurgitation is not visualized. Mild aortic valve sclerosis without stenosis. 11. The tricuspid regurgitant velocity is 3.66 m/s, and with an assumed right atrial pressure of 3 mmHg, the estimated right ventricular systolic pressure is moderately elevated at 56.6 mmHg. 12. The inferior vena cava is normal in size with greater than 50% respiratory variability, suggesting right atrial pressure of 3 mmHg. FINDINGS  Left Ventricle: Left ventricular  ejection fraction, by visual estimation, is 60 to 65%. The left ventricle has normal function. The left ventricle has no regional wall motion abnormalities. There is mildly increased left ventricular hypertrophy. Left ventricular diastolic parameters are consistent with Grade II diastolic dysfunction (pseudonormalization). Right Ventricle: The right ventricular size is normal. No increase in right ventricular wall thickness. Global RV systolic function is has normal systolic function. The tricuspid regurgitant velocity is 3.66 m/s, and with an assumed right atrial pressure  of 3 mmHg, the estimated right ventricular systolic pressure is moderately elevated at 56.6 mmHg. Left Atrium: Left atrial size was mildly dilated. Right Atrium: Right atrial size was mildly dilated Pericardium: There is no evidence of pericardial effusion. Mitral Valve: The mitral valve is normal in structure. Mild mitral annular calcification. Mild mitral valve regurgitation. No evidence of mitral valve stenosis by observation. Tricuspid Valve: The tricuspid valve is normal in structure. Tricuspid valve regurgitation is moderate. Aortic Valve: The aortic valve is tricuspid. Aortic valve regurgitation is not visualized. Mild aortic valve sclerosis is present, with no evidence of aortic valve stenosis. Pulmonic Valve: The pulmonic valve was normal in structure. Pulmonic valve regurgitation is not visualized. Pulmonic regurgitation is not visualized. Aorta: The aortic root is normal in size and structure. Venous: The inferior vena cava is normal in size with greater than 50% respiratory variability, suggesting right atrial pressure of 3 mmHg. IAS/Shunts: No atrial level shunt detected by color flow Doppler.  LEFT VENTRICLE PLAX 2D LVIDd:         4.10 cm  Diastology LVIDs:         2.80 cm  LV e' lateral:   5.68 cm/s LV PW:         1.20 cm  LV E/e' lateral: 30.6 LV IVS:        1.30 cm  LV e' medial:    5.03 cm/s LVOT diam:     1.90 cm  LV E/e'  medial:  34.6 LV SV:         45 ml LV SV Index:   29.54 LVOT Area:     2.84 cm  RIGHT VENTRICLE RV S prime:     15.40 cm/s TAPSE (M-mode): 3.1 cm LEFT ATRIUM             Index LA diam:        3.40 cm 2.25 cm/m LA Vol (A2C):   68.5 ml 45.33 ml/m LA Vol (A4C):   81.1 ml 53.67 ml/m LA Biplane Vol: 74.2 ml 49.11 ml/m  AORTIC VALVE LVOT Vmax:   151.00 cm/s LVOT Vmean:  94.500 cm/s LVOT VTI:    0.283 m  AORTA Ao Root diam: 2.90 cm MITRAL VALVE                         TRICUSPID VALVE MV Area (PHT): 3.60 cm              TR Peak grad:   53.6 mmHg MV PHT:        61.19 msec            TR Vmax:        366.00 cm/s MV Decel Time: 211 msec MV E velocity: 174.00 cm/s 103 cm/s  SHUNTS MV A velocity: 106.00 cm/s 70.3 cm/s Systemic VTI:  0.28 m MV E/A ratio:  1.64        1.5       Systemic Diam: 1.90 cm  Loralie Champagne MD Electronically signed by Loralie Champagne MD Signature Date/Time: 01/01/2020/1:42:19 PM    Final        Discharge Exam: Vitals:   01/02/20 0744 01/02/20 1155  BP: 120/77 111/61  Pulse: 75 73  Resp: 15 15  Temp: 98.2 F (36.8 C) 97.7 F (36.5 C)  SpO2: 96% 100%     General: Pt is alert, awake, not in acute distress Cardiovascular: RRR, S1/S2 +, no edema Respiratory: CTA bilaterally, no wheezing, no rhonchi, no respiratory distress, no conversational dyspnea, on room air  Abdominal: Soft, NT, ND, bowel sounds + Extremities: no edema, no cyanosis Psych: Normal mood and affect, stable judgement and insight     The results of significant diagnostics from this hospitalization (including imaging, microbiology, ancillary and laboratory) are listed below for reference.     Microbiology: Recent Results (from the past 240 hour(s))  Respiratory Panel by RT PCR (Flu A&B, Covid) - Nasopharyngeal Swab     Status: None   Collection Time: 01/01/20  5:39 AM   Specimen: Nasopharyngeal Swab  Result Value Ref Range Status   SARS Coronavirus 2 by RT PCR NEGATIVE NEGATIVE Final    Comment:  (NOTE) SARS-CoV-2 target nucleic acids are NOT DETECTED. The SARS-CoV-2 RNA is generally detectable in upper respiratoy specimens during the acute phase of infection. The lowest concentration of SARS-CoV-2 viral copies this assay can detect is 131 copies/mL. A negative result does not preclude SARS-Cov-2 infection and should not be used as the sole basis for treatment or other patient management decisions. A negative result may occur with  improper specimen collection/handling, submission of specimen other than nasopharyngeal swab, presence of viral mutation(s) within the areas targeted by this assay, and inadequate number of viral copies (<131 copies/mL). A negative result must be combined with clinical observations, patient history, and epidemiological information. The expected result is Negative. Fact Sheet for Patients:  PinkCheek.be Fact Sheet for Healthcare Providers:  GravelBags.it This  test is not yet ap proved or cleared by the [place] and  has been authorized for detection and/or diagnosis of SARS-CoV-2 by FDA under an Emergency Use Authorization (EUA). This EUA will remain  in effect (meaning this test can be used) for the duration of the COVID-19 declaration under Section 564(b)(1) of the Act, 21 U.S.C. section 360bbb-3(b)(1), unless the authorization is terminated or revoked sooner.    Influenza A by PCR NEGATIVE NEGATIVE Final   Influenza B by PCR NEGATIVE NEGATIVE Final    Comment: (NOTE) The Xpert Xpress SARS-CoV-2/FLU/RSV assay is intended as an aid in  the diagnosis of influenza from Nasopharyngeal swab specimens and  should not be used as a sole basis for treatment. Nasal washings and  aspirates are unacceptable for Xpert Xpress SARS-CoV-2/FLU/RSV  testing. Fact Sheet for Patients: PinkCheek.be Fact Sheet for Healthcare  Providers: GravelBags.it This test is not yet approved or cleared by the [place] FDA and  has been authorized for detection and/or diagnosis of SARS-CoV-2 by  FDA under an Emergency Use Authorization (EUA). This EUA will remain  in effect (meaning this test can be used) for the duration of the  Covid-19 declaration under Section 564(b)(1) of the Act, 21  U.S.C. section 360bbb-3(b)(1), unless the authorization is  terminated or revoked. Performed at [facility], [place] [address], [place], [place] [address]      Labs: BNP (last 3 results) Recent Labs    11/20/19 0430 01/01/20 0539  BNP 3,362.4* 9,242.6*   Basic Metabolic Panel: Recent Labs  Lab 01/01/20 0250 01/01/20 2131 01/02/20 0541  NA 140  --  136  K 3.9  --  4.0  CL 99  --  95*  CO2 25  --  26  GLUCOSE 120*  --  94  BUN 46*  --  24*  CREATININE 9.06*  --  5.57*  CALCIUM 9.3  --  9.1  MG  --  2.1  --    Liver Function Tests: No results for input(s): AST, ALT, ALKPHOS, BILITOT, PROT, ALBUMIN in the last 168 hours. No results for input(s): LIPASE, AMYLASE in the last 168 hours. No results for input(s): AMMONIA in the last 168 hours. CBC: Recent Labs  Lab 01/01/20 0250  WBC 10.9*  HGB 11.7*  HCT 36.6  MCV 91.3  PLT 254   Cardiac Enzymes: No results for input(s): CKTOTAL, CKMB, CKMBINDEX, TROPONINI in the last 168 hours. BNP: Invalid input(s): POCBNP CBG: No results for input(s): GLUCAP in the last 168 hours. D-Dimer No results for input(s): DDIMER in the last 72 hours. Hgb A1c No results for input(s): HGBA1C in the last 72 hours. Lipid Profile No results for input(s): CHOL, HDL, LDLCALC, TRIG, CHOLHDL, LDLDIRECT in the last 72 hours. Thyroid function studies No results for input(s): TSH, T4TOTAL, T3FREE, THYROIDAB in the last 72 hours.  Invalid input(s): FREET3 Anemia work up No results for input(s): VITAMINB12, FOLATE, FERRITIN, TIBC, IRON, RETICCTPCT in the  last 72 hours. Urinalysis    Component Value Date/Time   COLORURINE YELLOW 01/30/2011 1648   APPEARANCEUR CLOUDY (A) 01/30/2011 1648   LABSPEC 1.016 01/30/2011 1648   PHURINE 5.0 01/30/2011 1648   GLUCOSEU NEGATIVE 10/14/2009 0747   HGBUR SMALL (A) 01/30/2011 1648   BILIRUBINUR NEGATIVE 01/30/2011 1648   KETONESUR NEGATIVE 01/30/2011 1648   PROTEINUR 100 (A) 01/30/2011 1648   UROBILINOGEN 0.2 01/30/2011 1648   NITRITE NEGATIVE 01/30/2011 1648   LEUKOCYTESUR SMALL (A) 01/30/2011 1648   Sepsis  Labs Invalid input(s): PROCALCITONIN,  WBC,  LACTICIDVEN Microbiology Recent Results (from the past 240 hour(s))  Respiratory Panel by RT PCR (Flu A&B, Covid) - Nasopharyngeal Swab     Status: None   Collection Time: 01/01/20  5:39 AM   Specimen: Nasopharyngeal Swab  Result Value Ref Range Status   SARS Coronavirus 2 by RT PCR NEGATIVE NEGATIVE Final    Comment: (NOTE) SARS-CoV-2 target nucleic acids are NOT DETECTED. The SARS-CoV-2 RNA is generally detectable in upper respiratoy specimens during the acute phase of infection. The lowest concentration of SARS-CoV-2 viral copies this assay can detect is 131 copies/mL. A negative result does not preclude SARS-Cov-2 infection and should not be used as the sole basis for treatment or other patient management decisions. A negative result may occur with  improper specimen collection/handling, submission of specimen other than nasopharyngeal swab, presence of viral mutation(s) within the areas targeted by this assay, and inadequate number of viral copies (<131 copies/mL). A negative result must be combined with clinical observations, patient history, and epidemiological information. The expected result is Negative. Fact Sheet for Patients:  PinkCheek.be Fact Sheet for Healthcare Providers:  GravelBags.it This test is not yet ap proved or cleared by the [place] FDA and  has been  authorized for detection and/or diagnosis of SARS-CoV-2 by FDA under an Emergency Use Authorization (EUA). This EUA will remain  in effect (meaning this test can be used) for the duration of the COVID-19 declaration under Section 564(b)(1) of the Act, 21 U.S.C. section 360bbb-3(b)(1), unless the authorization is terminated or revoked sooner.    Influenza A by PCR NEGATIVE NEGATIVE Final   Influenza B by PCR NEGATIVE NEGATIVE Final    Comment: (NOTE) The Xpert Xpress SARS-CoV-2/FLU/RSV assay is intended as an aid in  the diagnosis of influenza from Nasopharyngeal swab specimens and  should not be used as a sole basis for treatment. Nasal washings and  aspirates are unacceptable for Xpert Xpress SARS-CoV-2/FLU/RSV  testing. Fact Sheet for Patients: PinkCheek.be Fact Sheet for Healthcare Providers: GravelBags.it This test is not yet approved or cleared by the [place] FDA and  has been authorized for detection and/or diagnosis of SARS-CoV-2 by  FDA under an Emergency Use Authorization (EUA). This EUA will remain  in effect (meaning this test can be used) for the duration of the  Covid-19 declaration under Section 564(b)(1) of the Act, 21  U.S.C. section 360bbb-3(b)(1), unless the authorization is  terminated or revoked. Performed at [facility], [place] [address], [place], [place] [address]      Patient was seen and examined on the day of discharge and was found to be in stable condition. Time coordinating discharge: 40 minutes including assessment and coordination of care, as well as examination of the patient.   SIGNED:  Dessa Phi, DO [facility] 01/02/2020, 12:50 PM

## 2020-01-02 NOTE — TOC Transition Note (Signed)
Transition of Care [facility]) - CM/SW Discharge Note   Patient Details  Name: [name] MRN: [id number] Date of Birth: 01-Mar-1951  [facility] CM/SW Contact:  Zenon Mayo, RN Phone Number: 01/02/2020, 1:13 PM   Clinical Narrative:    Patient is for dc today, NCM offered choice, she states  she is active with [facility] and would like to continue with them.  NCM contacted Malachy Mood to confirm. She confirms this and will resume services for HHPT. Soc will begin 24 to 48 hrs post dc.    Final next level of care: [place] Barriers to Discharge: No Barriers Identified   Patient Goals and CMS Choice Patient states their goals for this hospitalization and ongoing recovery are:: get better CMS Medicare.gov Compare Post Acute Care list provided to:: Patient    Discharge Placement                       Discharge Plan and Services                DME Arranged: (NA)         HH Arranged: PT HH Agency: [place] Date [place]: 01/02/20 Time [place]: 2919 Representative spoke with at [place]: [place] ([place]) Interventions     Readmission Risk Interventions No flowsheet data found.

## 2020-01-02 NOTE — Evaluation (Signed)
Physical Therapy Evaluation Patient Details Name: [name] MRN: [id number] DOB: 05-10-1951 Today's Date: 01/02/2020   History of Present Illness  69 y.o. female with medical history significant of ESRD on HD presenting to ED w/ SOB < 24 hrs, onset last evening on Sunday. CXR shows pulm edema, pt feeling better on nasal O2.  Clinical Impression  Pt presents with an overall decrease in functional mobility secondary to above. PTA, pt was Mod I with use of SPC for household ambulation and use of rollator for community ambulation to/from dialysis. Educ on precautions, positioning, therex, and importance of mobility. Today, pt performed bed mobility mod I, pt able to ambulated x 40 ft with min assist without device and ambulated x 100 ft with RW and supervision. Pt lives with her mom who can provided supervision for mobility, pt takes [employer] transportation to/from dialysis and pt's sister comes by often to check on her. Pt would benefit from continued acute PT services to maximize functional mobility and independence prior to d/c home with home health services.     Follow Up Recommendations Home health PT;Supervision for mobility/OOB    Equipment Recommendations  None recommended by PT    Recommendations for Other Services       Precautions / Restrictions Precautions Precautions: Fall Restrictions Weight Bearing Restrictions: No      Mobility  Bed Mobility Overal bed mobility: Modified Independent             General bed mobility comments: pt uses bed rail, transfers to sitting EOB Mod I  Transfers Overall transfer level: Needs assistance Equipment used: Rolling walker (2 wheeled);None Transfers: Sit to/from [employer] to Stand: Supervision;Min guard Stand pivot transfers: Supervision;Min guard       General transfer comment: Pt performed sit<>stands and stand pivot transfers today without assistive device- min guard assist for balance. When using  RW for transfers and sit<>stand pt only requires supervision  Ambulation/Gait Ambulation/Gait assistance: Min assist;Supervision Gait Distance (Feet): 100 Feet(+ 40 ft) Assistive device: Rolling walker (2 wheeled);None Gait Pattern/deviations: Decreased step length - left;Decreased stance time - right;Trunk flexed Gait velocity: decreased Gait velocity interpretation: <1.31 ft/sec, indicative of household ambulator General Gait Details: Pt ambulated x 40 ft without assistive device with min assist to steady externally for balance, pt ambulated x 100 ft today with RW and supervision but requiring cues for device management  Stairs            Wheelchair Mobility    Modified Rankin (Stroke Patients Only)       Balance Overall balance assessment: Needs assistance Sitting-balance support: Feet supported Sitting balance-Leahy Scale: Fair Sitting balance - Comments: supervision for static sitting EOB   Standing balance support: No upper extremity supported;During functional activity Standing balance-Leahy Scale: Fair Standing balance comment: able to maintain dynamic standing balance without UE support while ambulating with min assist from therapist                             Pertinent Vitals/Pain Pain Assessment: No/denies pain    Home Living Family/patient expects to be discharged to:: Private residence Living Arrangements: Parent(her mother lives with her, sister comes by to check on them) Available Help at Discharge: Family;Other (Comment)(sister there frequently) Type of Home: House Home Access: Stairs to enter Entrance Stairs-Rails: Left Entrance Stairs-Number of Steps: 3 STE Home Layout: One level Home Equipment: Shower seat;Cane - single point;Walker - 2 wheels  Prior Function Level of Independence: Independent with assistive device(s)         Comments: Pt rides [employer] to dialysis and she uses her RW when going to/from dialysis. Uses her SPC  when in her home as needed     Hand Dominance   Dominant Hand: Right    Extremity/Trunk Assessment   Upper Extremity Assessment Upper Extremity Assessment: Overall WFL for tasks assessed    Lower Extremity Assessment Lower Extremity Assessment: Overall WFL for tasks assessed;Generalized weakness    Cervical / Trunk Assessment Cervical / Trunk Assessment: Normal  Communication   Communication: No difficulties  Cognition Arousal/Alertness: Awake/alert Behavior During Therapy: WFL for tasks assessed/performed Overall Cognitive Status: Within Functional Limits for tasks assessed                                 General Comments: pleasant and eager to do therapy      General Comments General comments (skin integrity, edema, etc.): Pt declines shortness of breath this session with activity    Exercises     Assessment/Plan    PT Assessment Patient needs continued PT services  PT Problem List Decreased strength;Decreased mobility;Decreased activity tolerance;Cardiopulmonary status limiting activity;Decreased balance       PT Treatment Interventions DME instruction;Therapeutic activities;Gait training;Therapeutic exercise;Patient/family education;Stair training;Balance training;Functional mobility training;Neuromuscular re-education    PT Goals (Current goals can be found in the Care Plan section)  Acute Rehab PT Goals Patient Stated Goal: "to go home soon" PT Goal Formulation: With patient Time For Goal Achievement: 01/16/20 Potential to Achieve Goals: Good    Frequency Min 3X/week   Barriers to discharge        Co-evaluation               AM-PAC PT "6 Clicks" Mobility  Outcome Measure Help needed turning from your back to your side while in a flat bed without using bedrails?: None Help needed moving from lying on your back to sitting on the side of a flat bed without using bedrails?: A Little Help needed moving to and from a bed to a chair  (including a wheelchair)?: A Little Help needed standing up from a chair using your arms (e.g., wheelchair or bedside chair)?: A Little Help needed to walk in hospital room?: A Little Help needed climbing 3-5 steps with a railing? : A Little 6 Click Score: 19    End of Session Equipment Utilized During Treatment: Gait belt Activity Tolerance: Patient tolerated treatment well Patient left: in chair;with chair alarm set;with call bell/phone within reach Nurse Communication: Mobility status PT Visit Diagnosis: Unsteadiness on feet (R26.81);Muscle weakness (generalized) (M62.81)    Time: 2993-7169 PT Time Calculation (min) (ACUTE ONLY): 18 min   Charges:   PT Evaluation $PT Eval Moderate Complexity: 1 Mod          Netta Corrigan, PT, DPT, CSRS 01/02/20  10:07 AM   Drema Dallas Schagen 01/02/2020, 10:07 AM

## 2020-01-02 NOTE — Care Management Obs Status (Signed)
MEDICARE OBSERVATION STATUS NOTIFICATION   Patient Details  Name: [name] MRN: [id number] Date of Birth: 10-27-1951   Medicare Observation Status Notification Given:  Yes    Zenon Mayo, RN 01/02/2020, 1:08 PM

## 2020-01-02 NOTE — Discharge Instructions (Signed)

## 2020-01-02 NOTE — Plan of Care (Signed)
  Problem: Education: Goal: Knowledge of General Education information will improve Description: Including pain rating scale, medication(s)/side effects and non-pharmacologic comfort measures Outcome: Progressing   Problem: Health Behavior/Discharge Planning: Goal: Ability to manage health-related needs will improve Outcome: Progressing   Problem: Clinical Measurements: Goal: Ability to maintain clinical measurements within normal limits will improve Outcome: Progressing Goal: Respiratory complications will improve Outcome: Progressing   Problem: Clinical Measurements: Goal: Respiratory complications will improve Outcome: Progressing

## 2020-01-02 NOTE — Progress Notes (Addendum)
[facility] Progress Note   Subjective:   Patient seen and examined at bedside.  Reports feeling much better.  Denies SOB, edema, N/v, CP, weakness and dizziness.  Admits to diarrhea yesterday after dialysis.    Objective Vitals:   01/02/20 0416 01/02/20 0419 01/02/20 0744 01/02/20 1155  BP: 110/66  120/77 111/61  Pulse: 70  75 73  Resp: 16  15 15   Temp: (!) 97.5 F (36.4 C)  98.2 F (36.8 C) 97.7 F (36.5 C)  TempSrc: Oral  Oral Oral  SpO2: 95%  96% 100%  Weight:  48.9 kg     Physical Exam General:NAD, well appearing female, laying in bed Heart: RRR, 2/6 systolic murmur Lungs:CTAB Abdomen:soft, NTND Extremities:no LE edema Dialysis Access: LU AVF +b   Filed Weights   01/02/20 0419  Weight: 48.9 kg    Intake/Output Summary (Last 24 hours) at 01/02/2020 1231 Last data filed at 01/02/2020 1013 Gross per 24 hour  Intake 480 ml  Output 2550 ml  Net -2070 ml    Additional Objective Labs: Basic Metabolic Panel: Recent Labs  Lab 01/01/20 0250 01/02/20 0541  NA 140 136  K 3.9 4.0  CL 99 95*  CO2 25 26  GLUCOSE 120* 94  BUN 46* 24*  CREATININE 9.06* 5.57*  CALCIUM 9.3 9.1   CBC: Recent Labs  Lab 01/01/20 0250  WBC 10.9*  HGB 11.7*  HCT 36.6  MCV 91.3  PLT 254   Blood Culture    Component Value Date/Time   SDES BLOOD RIGHT HAND 11/19/2015 0937   SPECREQUEST  11/19/2015 0937    BOTTLES DRAWN AEROBIC AND ANAEROBIC 10CCS BLUE 5CCS RED   CULT NO GROWTH 5 DAYS 11/19/2015 5027   REPTSTATUS 11/24/2015 FINAL 11/19/2015 7412    Lab Results  Component Value Date   INR 1.10 02/23/2017   INR 1.13 11/19/2015   INR 1.06 11/16/2015   Studies/Results: DG Chest 2 View  Result Date: 01/01/2020 CLINICAL DATA:  Chest pain. Shortness of breath. Dialysis patient. EXAM: CHEST - 2 VIEW COMPARISON:  Radiograph and CT 11/20/2019 FINDINGS: Stable cardiomegaly. Aortic atherosclerosis. Worsening pulmonary edema from prior exam. Small bilateral pleural  effusions. No confluent airspace disease. No pneumothorax. No acute osseous abnormalities are seen. IMPRESSION: CHF, worsened since December. Electronically Signed   By: Keith Rake M.D.   On: 01/01/2020 02:40   ECHOCARDIOGRAM COMPLETE  Result Date: 01/01/2020   ECHOCARDIOGRAM REPORT   Patient Name:   [name] Date of Exam: 01/01/2020 Medical Rec #:  [id number]         Height:       62.0 in Accession #:    [id number]        Weight:       115.0 lb Date of Birth:  1951-05-15         BSA:          1.51 m Patient Age:    45 years          BP:           167/86 mmHg Patient Gender: F                 HR:           83 bpm. Exam Location:  Inpatient Procedure: 2D Echo Indications:    CHF-Acute Diastolic 629.47 / M54.65  History:        Patient has prior history of Echocardiogram examinations, most  recent 02/24/2017. CAD; Risk Factors:Hypertension and                 Dyslipidemia. End stage renal disease, Anemia.  Sonographer:    Darlina Sicilian RDCS Referring Phys: [id number] [place]  1. Left ventricular ejection fraction, by visual estimation, is 60 to 65%. The left ventricle has normal function. There is mildly increased left ventricular hypertrophy.  2. Left ventricular diastolic parameters are consistent with Grade II diastolic dysfunction (pseudonormalization).  3. The left ventricle has no regional wall motion abnormalities.  4. Global right ventricle has normal systolic function.The right ventricular size is normal. No increase in right ventricular wall thickness.  5. Left atrial size was mildly dilated.  6. Right atrial size was mildly dilated.  7. Mild mitral annular calcification.  8. The mitral valve is normal in structure. Mild mitral valve regurgitation. No evidence of mitral stenosis.  9. The tricuspid valve is normal in structure. Tricuspid valve regurgitation is moderate. 10. The aortic valve is tricuspid. Aortic valve regurgitation is not visualized. Mild aortic  valve sclerosis without stenosis. 11. The tricuspid regurgitant velocity is 3.66 m/s, and with an assumed right atrial pressure of 3 mmHg, the estimated right ventricular systolic pressure is moderately elevated at 56.6 mmHg. 12. The inferior vena cava is normal in size with greater than 50% respiratory variability, suggesting right atrial pressure of 3 mmHg. FINDINGS  Left Ventricle: Left ventricular ejection fraction, by visual estimation, is 60 to 65%. The left ventricle has normal function. The left ventricle has no regional wall motion abnormalities. There is mildly increased left ventricular hypertrophy. Left ventricular diastolic parameters are consistent with Grade II diastolic dysfunction (pseudonormalization). Right Ventricle: The right ventricular size is normal. No increase in right ventricular wall thickness. Global RV systolic function is has normal systolic function. The tricuspid regurgitant velocity is 3.66 m/s, and with an assumed right atrial pressure  of 3 mmHg, the estimated right ventricular systolic pressure is moderately elevated at 56.6 mmHg. Left Atrium: Left atrial size was mildly dilated. Right Atrium: Right atrial size was mildly dilated Pericardium: There is no evidence of pericardial effusion. Mitral Valve: The mitral valve is normal in structure. Mild mitral annular calcification. Mild mitral valve regurgitation. No evidence of mitral valve stenosis by observation. Tricuspid Valve: The tricuspid valve is normal in structure. Tricuspid valve regurgitation is moderate. Aortic Valve: The aortic valve is tricuspid. Aortic valve regurgitation is not visualized. Mild aortic valve sclerosis is present, with no evidence of aortic valve stenosis. Pulmonic Valve: The pulmonic valve was normal in structure. Pulmonic valve regurgitation is not visualized. Pulmonic regurgitation is not visualized. Aorta: The aortic root is normal in size and structure. Venous: The inferior vena cava is normal in  size with greater than 50% respiratory variability, suggesting right atrial pressure of 3 mmHg. IAS/Shunts: No atrial level shunt detected by color flow Doppler.  LEFT VENTRICLE PLAX 2D LVIDd:         4.10 cm  Diastology LVIDs:         2.80 cm  LV e' lateral:   5.68 cm/s LV PW:         1.20 cm  LV E/e' lateral: 30.6 LV IVS:        1.30 cm  LV e' medial:    5.03 cm/s LVOT diam:     1.90 cm  LV E/e' medial:  34.6 LV SV:         45 ml LV SV Index:  29.54 LVOT Area:     2.84 cm  RIGHT VENTRICLE RV S prime:     15.40 cm/s TAPSE (M-mode): 3.1 cm LEFT ATRIUM             Index LA diam:        3.40 cm 2.25 cm/m LA Vol (A2C):   68.5 ml 45.33 ml/m LA Vol (A4C):   81.1 ml 53.67 ml/m LA Biplane Vol: 74.2 ml 49.11 ml/m  AORTIC VALVE LVOT Vmax:   151.00 cm/s LVOT Vmean:  94.500 cm/s LVOT VTI:    0.283 m  AORTA Ao Root diam: 2.90 cm MITRAL VALVE                         TRICUSPID VALVE MV Area (PHT): 3.60 cm              TR Peak grad:   53.6 mmHg MV PHT:        61.19 msec            TR Vmax:        366.00 cm/s MV Decel Time: 211 msec MV E velocity: 174.00 cm/s 103 cm/s  SHUNTS MV A velocity: 106.00 cm/s 70.3 cm/s Systemic VTI:  0.28 m MV E/A ratio:  1.64        1.5       Systemic Diam: 1.90 cm  Loralie Champagne MD Electronically signed by Loralie Champagne MD Signature Date/Time: 01/01/2020/1:42:19 PM    Final     Medications: . sodium chloride     . allopurinol  150 mg Oral QHS  . amLODipine  10 mg Oral QHS  . Chlorhexidine Gluconate Cloth  6 each Topical Q0600  . feeding supplement (NEPRO CARB STEADY)  237 mL Oral BID BM  . ferric citrate  420 mg Oral TID WC  . heparin  5,000 Units Subcutaneous Q12H  . labetalol  400 mg Oral BID  . losartan  50 mg Oral QHS  . multivitamin  1 tablet Oral QHS  . simvastatin  20 mg Oral QHS  . sodium chloride flush  3 mL Intravenous Q12H    Dialysis Orders: [facility] MWF 3h 74min 350/1.5  2/2 bath  P4  L AVF  Hep 3000 EDW 49.5kg mircera 56mcg q2wks - last 1/11 Hectorol 40mcg  qHD  Assessment/Plan: 1. Pulmonary edema - CXR showed pulmonary edema. improved post HD.  Net UF 2.5L removed. Needing standing weights to assess EDW. 2. ESRD - on HD MWF.  K 4.0. Orders written for HD tomorrow per regular schedule.  Can return to OP dialysis if discharged.  3. Anemia of CKD- Hgb 11.7. No indication for ESA at this time 4. Secondary hyperparathyroidism - Ca in goal. Will check phos.  Continue binders. 5. HTN - Blood pressure well controlled. Continue home meds. 6. Nutrition - severe protein calorie malnutrition.  Dietitian assessing.  7. Diastolic CHF   Jen Mow, PA-C [facility] Pager: [phone] 01/02/2020,12:31 PM  LOS: 1 day

## 2020-01-02 NOTE — Progress Notes (Addendum)
Initial Nutrition Assessment  DOCUMENTATION CODES:   Severe malnutrition in context of chronic illness  INTERVENTION:   -Continue Nepro Shake po BID, each supplement provides 425 kcal and 19 grams protein -Renal MVI daily -Snacks TID between meals -Provided "Suggestions for Increasing Calories and Protein" handout from [facility] Nutrition Care Manual; handout attached to AVS/ discharge instructions  NUTRITION DIAGNOSIS:   Severe Malnutrition related to chronic illness(ESRD on HD) as evidenced by energy intake < or equal to 75% for > or equal to 1 month, moderate fat depletion, severe fat depletion, moderate muscle depletion, severe muscle depletion.  GOAL:   Patient will meet greater than or equal to 90% of their needs  MONITOR:   PO intake, Supplement acceptance, Labs, Weight trends, Skin, I & O's  REASON FOR ASSESSMENT:   Malnutrition Screening Tool    ASSESSMENT:   [name] is a 68 y.o. female with medical history significant of ESRD on HD presenting to ED w/ SOB < 24 hrs, onset last evening on Sunday.  Associated with chest pain, on the right side of the chest, aching like 4-5/10, non-irradiating, each episode lasts for few hours, no exacerbating or relieving factors no abd pain or n/v/d, no f/c/s.  Pt admitted with acute CHF decompensation.   Reviewed I/O's: -2.4 L x 24 hours  Spoke with pt at bedside, who was pleasant, but tangential at time of visit. Pt answered close ended questions, but often went off topic or evaded questions when probing for further details. She has been experiencing a decline in health and mobility over the past several months. Her mom and sister help with meal preparation and also receives home health OT services. Pt reports good progress with therapies and is proud that she has been able to walk on her driveway.   Pt reports that her appetite has improved since hospitalization, especially since experiencing diarrhea PTA after HD. Pt shares  she typically has constipation that is resolved with laxatives. She consumed 100% of eggs and and English muffin for breakfast and most of her grits. Noted meal completion 75-80%.   Pt endorses weight loss due to decreased oral intake at home ("my HD RD says I need to eat more"). She typically consumes 2 meals per day, which consist of a meat, starch, and vegetable. On HD days, she consumes only a "big hot meal" after HD, as she skips breakfast due to relying on medical transport for HD. Per pt, UBW is around 120#, but is unsure when weight loss started. She reports her dry wt is 50 kg and has been progressively declining. Reviewed wt hx; noted pt has experienced a 5.2% wt loss over the past 3 months, however, this is not significant for time frame.  Discussed importance of good meal and supplement intake to promote healing. Pt has tried Administrator in the past. She initially requested Premier Protein type supplement, however, amenable to Nepro due to increased calorie content. Discussed ways to increase calories and protein in her diet, including consuming small, frequent meals. Pt has good rapport with her HD RD and plans to discuss this with her further.   Labs reviewed.   NUTRITION - FOCUSED PHYSICAL EXAM:    Most Recent Value  Orbital Region  Moderate depletion  Upper Arm Region  Severe depletion  Thoracic and Lumbar Region  Severe depletion  Buccal Region  Moderate depletion  Temple Region  Severe depletion  Clavicle Bone Region  Severe depletion  Clavicle and Acromion Bone  Region  Severe depletion  Scapular Bone Region  Severe depletion  Dorsal Hand  Moderate depletion  Patellar Region  Moderate depletion  Anterior Thigh Region  Moderate depletion  Posterior Calf Region  Moderate depletion  Edema (RD Assessment)  Mild  Hair  Reviewed  Eyes  Reviewed  Mouth  Reviewed  Skin  Reviewed  Nails  Reviewed       Diet Order:   Diet Order            Diet renal with  fluid restriction Fluid restriction: 1200 mL Fluid; Room service appropriate? Yes; Fluid consistency: Thin  Diet effective now              EDUCATION NEEDS:   Education needs have been addressed  Skin:  Skin Assessment: Reviewed RN Assessment  Last BM:  01/01/20  Height:   Ht Readings from Last 1 Encounters:  12/21/19 5\' 2"  (1.575 m)    Weight:   Wt Readings from Last 1 Encounters:  01/02/20 48.9 kg    Ideal Body Weight:  50 kg  BMI:  Body mass index is 19.7 kg/m.  Estimated Nutritional Needs:   Kcal:  9407-6808  Protein:  85-100 grams  Fluid:  1000 ml +UOP    Alnisa Hasley A. Jimmye Norman, RD, LDN, [place] Registered Dietitian II Certified Diabetes Care and Education Specialist Pager: [phone] After hours Pager: [phone]

## 2020-01-03 ENCOUNTER — Telehealth: Payer: Self-pay | Admitting: Nurse Practitioner

## 2020-01-03 DIAGNOSIS — N2581 Secondary hyperparathyroidism of renal origin: Secondary | ICD-10-CM | POA: Diagnosis not present

## 2020-01-03 DIAGNOSIS — D509 Iron deficiency anemia, unspecified: Secondary | ICD-10-CM | POA: Diagnosis not present

## 2020-01-03 DIAGNOSIS — T82510A Breakdown (mechanical) of surgically created arteriovenous fistula, initial encounter: Secondary | ICD-10-CM | POA: Diagnosis not present

## 2020-01-03 DIAGNOSIS — D631 Anemia in chronic kidney disease: Secondary | ICD-10-CM | POA: Diagnosis not present

## 2020-01-03 DIAGNOSIS — Z992 Dependence on renal dialysis: Secondary | ICD-10-CM | POA: Diagnosis not present

## 2020-01-03 DIAGNOSIS — N186 End stage renal disease: Secondary | ICD-10-CM | POA: Diagnosis not present

## 2020-01-03 NOTE — Telephone Encounter (Signed)
Transition of care contact from inpatient facility  Date of discharge: 01/02/2020 Date of contact: 01/04/2020 Method of contact: Phone Who did you talk to? [name]  Patient contacted to discuss transition of care from recent inpatient hospitalization. Patient was admitted to [facility] from 01/01/20 to 01/02/2020 with discharge diagnosis of Acute on Chronic Diastolic HF Medication changes were reviewed. Patient will follow up with his outpatient dialysis center on 01/03/2020.  Other follow up needed includes PCP Dr. Delfina Redwood

## 2020-01-05 DIAGNOSIS — D509 Iron deficiency anemia, unspecified: Secondary | ICD-10-CM | POA: Diagnosis not present

## 2020-01-05 DIAGNOSIS — T82510A Breakdown (mechanical) of surgically created arteriovenous fistula, initial encounter: Secondary | ICD-10-CM | POA: Diagnosis not present

## 2020-01-05 DIAGNOSIS — Z992 Dependence on renal dialysis: Secondary | ICD-10-CM | POA: Diagnosis not present

## 2020-01-05 DIAGNOSIS — D631 Anemia in chronic kidney disease: Secondary | ICD-10-CM | POA: Diagnosis not present

## 2020-01-05 DIAGNOSIS — N186 End stage renal disease: Secondary | ICD-10-CM | POA: Diagnosis not present

## 2020-01-05 DIAGNOSIS — N2581 Secondary hyperparathyroidism of renal origin: Secondary | ICD-10-CM | POA: Diagnosis not present

## 2020-01-08 DIAGNOSIS — D509 Iron deficiency anemia, unspecified: Secondary | ICD-10-CM | POA: Diagnosis not present

## 2020-01-08 DIAGNOSIS — N2581 Secondary hyperparathyroidism of renal origin: Secondary | ICD-10-CM | POA: Diagnosis not present

## 2020-01-08 DIAGNOSIS — Z992 Dependence on renal dialysis: Secondary | ICD-10-CM | POA: Diagnosis not present

## 2020-01-08 DIAGNOSIS — T82510A Breakdown (mechanical) of surgically created arteriovenous fistula, initial encounter: Secondary | ICD-10-CM | POA: Diagnosis not present

## 2020-01-08 DIAGNOSIS — D631 Anemia in chronic kidney disease: Secondary | ICD-10-CM | POA: Diagnosis not present

## 2020-01-08 DIAGNOSIS — N186 End stage renal disease: Secondary | ICD-10-CM | POA: Diagnosis not present

## 2020-01-09 DIAGNOSIS — I10 Essential (primary) hypertension: Secondary | ICD-10-CM | POA: Diagnosis not present

## 2020-01-09 DIAGNOSIS — I5033 Acute on chronic diastolic (congestive) heart failure: Secondary | ICD-10-CM | POA: Diagnosis not present

## 2020-01-09 DIAGNOSIS — G3184 Mild cognitive impairment, so stated: Secondary | ICD-10-CM | POA: Diagnosis not present

## 2020-01-09 DIAGNOSIS — N186 End stage renal disease: Secondary | ICD-10-CM | POA: Diagnosis not present

## 2020-01-10 DIAGNOSIS — D631 Anemia in chronic kidney disease: Secondary | ICD-10-CM | POA: Diagnosis not present

## 2020-01-10 DIAGNOSIS — I4891 Unspecified atrial fibrillation: Secondary | ICD-10-CM | POA: Diagnosis not present

## 2020-01-10 DIAGNOSIS — I120 Hypertensive chronic kidney disease with stage 5 chronic kidney disease or end stage renal disease: Secondary | ICD-10-CM | POA: Diagnosis not present

## 2020-01-10 DIAGNOSIS — E785 Hyperlipidemia, unspecified: Secondary | ICD-10-CM | POA: Diagnosis not present

## 2020-01-10 DIAGNOSIS — N2581 Secondary hyperparathyroidism of renal origin: Secondary | ICD-10-CM | POA: Diagnosis not present

## 2020-01-10 DIAGNOSIS — M6281 Muscle weakness (generalized): Secondary | ICD-10-CM | POA: Diagnosis not present

## 2020-01-10 DIAGNOSIS — T82510A Breakdown (mechanical) of surgically created arteriovenous fistula, initial encounter: Secondary | ICD-10-CM | POA: Diagnosis not present

## 2020-01-10 DIAGNOSIS — Z992 Dependence on renal dialysis: Secondary | ICD-10-CM | POA: Diagnosis not present

## 2020-01-10 DIAGNOSIS — N186 End stage renal disease: Secondary | ICD-10-CM | POA: Diagnosis not present

## 2020-01-10 DIAGNOSIS — D509 Iron deficiency anemia, unspecified: Secondary | ICD-10-CM | POA: Diagnosis not present

## 2020-01-11 ENCOUNTER — Ambulatory Visit (INDEPENDENT_AMBULATORY_CARE_PROVIDER_SITE_OTHER): Payer: Medicare Other | Admitting: Vascular Surgery

## 2020-01-12 ENCOUNTER — Other Ambulatory Visit (INDEPENDENT_AMBULATORY_CARE_PROVIDER_SITE_OTHER): Payer: Self-pay | Admitting: Vascular Surgery

## 2020-01-12 DIAGNOSIS — Z992 Dependence on renal dialysis: Secondary | ICD-10-CM | POA: Diagnosis not present

## 2020-01-12 DIAGNOSIS — N2581 Secondary hyperparathyroidism of renal origin: Secondary | ICD-10-CM | POA: Diagnosis not present

## 2020-01-12 DIAGNOSIS — T82510A Breakdown (mechanical) of surgically created arteriovenous fistula, initial encounter: Secondary | ICD-10-CM | POA: Diagnosis not present

## 2020-01-12 DIAGNOSIS — T829XXS Unspecified complication of cardiac and vascular prosthetic device, implant and graft, sequela: Secondary | ICD-10-CM

## 2020-01-12 DIAGNOSIS — N186 End stage renal disease: Secondary | ICD-10-CM | POA: Diagnosis not present

## 2020-01-12 DIAGNOSIS — D509 Iron deficiency anemia, unspecified: Secondary | ICD-10-CM | POA: Diagnosis not present

## 2020-01-12 DIAGNOSIS — D631 Anemia in chronic kidney disease: Secondary | ICD-10-CM | POA: Diagnosis not present

## 2020-01-15 DIAGNOSIS — N2581 Secondary hyperparathyroidism of renal origin: Secondary | ICD-10-CM | POA: Diagnosis not present

## 2020-01-15 DIAGNOSIS — T8612 Kidney transplant failure: Secondary | ICD-10-CM | POA: Diagnosis not present

## 2020-01-15 DIAGNOSIS — Z992 Dependence on renal dialysis: Secondary | ICD-10-CM | POA: Diagnosis not present

## 2020-01-15 DIAGNOSIS — N186 End stage renal disease: Secondary | ICD-10-CM | POA: Diagnosis not present

## 2020-01-15 DIAGNOSIS — D509 Iron deficiency anemia, unspecified: Secondary | ICD-10-CM | POA: Diagnosis not present

## 2020-01-15 DIAGNOSIS — D631 Anemia in chronic kidney disease: Secondary | ICD-10-CM | POA: Diagnosis not present

## 2020-01-16 ENCOUNTER — Encounter (INDEPENDENT_AMBULATORY_CARE_PROVIDER_SITE_OTHER): Payer: Self-pay | Admitting: Nurse Practitioner

## 2020-01-16 ENCOUNTER — Other Ambulatory Visit: Payer: Self-pay

## 2020-01-16 ENCOUNTER — Ambulatory Visit (INDEPENDENT_AMBULATORY_CARE_PROVIDER_SITE_OTHER): Payer: Medicare Other

## 2020-01-16 ENCOUNTER — Ambulatory Visit (INDEPENDENT_AMBULATORY_CARE_PROVIDER_SITE_OTHER): Payer: Medicare Other | Admitting: Nurse Practitioner

## 2020-01-16 VITALS — BP 111/61 | HR 80 | Resp 16 | Ht 62.0 in | Wt 111.0 lb

## 2020-01-16 DIAGNOSIS — E785 Hyperlipidemia, unspecified: Secondary | ICD-10-CM

## 2020-01-16 DIAGNOSIS — N186 End stage renal disease: Secondary | ICD-10-CM | POA: Diagnosis not present

## 2020-01-16 DIAGNOSIS — T829XXS Unspecified complication of cardiac and vascular prosthetic device, implant and graft, sequela: Secondary | ICD-10-CM

## 2020-01-16 DIAGNOSIS — K219 Gastro-esophageal reflux disease without esophagitis: Secondary | ICD-10-CM | POA: Diagnosis not present

## 2020-01-16 DIAGNOSIS — I10 Essential (primary) hypertension: Secondary | ICD-10-CM | POA: Diagnosis not present

## 2020-01-17 ENCOUNTER — Encounter (INDEPENDENT_AMBULATORY_CARE_PROVIDER_SITE_OTHER): Payer: Self-pay | Admitting: Nurse Practitioner

## 2020-01-17 DIAGNOSIS — N2581 Secondary hyperparathyroidism of renal origin: Secondary | ICD-10-CM | POA: Diagnosis not present

## 2020-01-17 DIAGNOSIS — N186 End stage renal disease: Secondary | ICD-10-CM | POA: Diagnosis not present

## 2020-01-17 DIAGNOSIS — D631 Anemia in chronic kidney disease: Secondary | ICD-10-CM | POA: Diagnosis not present

## 2020-01-17 DIAGNOSIS — Z992 Dependence on renal dialysis: Secondary | ICD-10-CM | POA: Diagnosis not present

## 2020-01-17 DIAGNOSIS — D509 Iron deficiency anemia, unspecified: Secondary | ICD-10-CM | POA: Diagnosis not present

## 2020-01-17 NOTE — Progress Notes (Signed)
SUBJECTIVE:  Patient ID: [name], female    DOB: November 27, 1951, 69 y.o.   MRN: [id number] Chief Complaint  Patient presents with  . Follow-up    ultrasound    HPI  [name] is a 69 y.o. 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  Currently the patient is still able to use her fistula however time is of the essence to prevent any further ulceration or possible rupture.  The patient denies any issues with bleeding, fever or chills during dialysis.  No TIA-like symptoms or strokelike symptoms.  Patient denies any amaurosis fugax.  Today the patient underwent right upper extremity vein mapping which show adequate access for a right brachial axillary graft.  Past Medical History:  Diagnosis Date  . Adenomatous polyp of colon 10/2010, 2006, 2015  . Anemia in CKD (chronic kidney disease) 11/07/2012   s/p blood transfusion.   . Arthritis   . CAD (coronary artery disease)    "something like that"  . CHF (congestive heart failure) (HCC)   . Constipation   . Depression with anxiety   . Diverticula, colon   . 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  Had some early rejection then stable function for years, then had slow decline of function and went back on hemodialysis in 2012.  Gets HD TTS schedule at [facility] on [facility] still using L forearm AVF.     Marland Kitchen GERD (gastroesophageal reflux disease)   . GI bleed 2017   felt to be ischemic colitis, last colo 2015  . Headache   . Hyperlipidemia   . Hypertension   . Neurologic gait dysfunction   . Neuromuscular disorder (HCC)    neuropathy hand and legs  . Osteoporosis   . Pneumonia   . 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 Pulmonary edema 12/2019  . Stroke [facility]) 11/2015   TIA    . Weight loss, unintentional     Past Surgical History:  Procedure Laterality Date  . AV FISTULA PLACEMENT     for dialysis  . AV FISTULA PLACEMENT Left 11/22/2015   Procedure: ARTERIOVENOUS (AV) FISTULA CREATION-LEFT BRACHIOCEPHALIC;  Surgeon: Nada Libman, MD;  Location: [facility] OR;  Service: Vascular;  Laterality: Left;  . BACK SURGERY    . CERVICAL FUSION    . CHOLECYSTECTOMY  12/02/2012   Procedure: LAPAROSCOPIC CHOLECYSTECTOMY WITH INTRAOPERATIVE CHOLANGIOGRAM;  Surgeon: Mariella Saa, MD;  Location: [facility] OR;  Service: General;  Laterality: N/A;  . EYE SURGERY Bilateral    cataract surgery  . EYE SURGERY Left 2019   laser  . HEMATOMA EVACUATION Left 12/24/2016   Procedure: EVACUATION HEMATOMA LEFT UPPER ARM;  Surgeon: Maeola Harman, MD;  Location: [facility] OR;  Service: Vascular;  Laterality: Left;  . I & D EXTREMITY Left 12/31/2016   Procedure: IRRIGATION AND DEBRIDEMENT EXTREMITY;  Surgeon: Chuck Hint, MD;  Location: [facility] OR;  Service: Vascular;  Laterality: Left;  . INSERTION OF DIALYSIS CATHETER Right 12/24/2016   Procedure: INSERTION OF DIALYSIS CATHETER;  Surgeon: Maeola Harman, MD;  Location: [facility] OR;  Service: Vascular;  Laterality: Right;  . INSERTION OF DIALYSIS CATHETER Right 02/04/2017   Procedure: INSERTION OF DIALYSIS CATHETER;  Surgeon: Maeola Harman, MD;  Location: [facility] OR;  Service: Vascular;  Laterality: Right;  . KIDNEY TRANSPLANT  1996  . PERIPHERAL VASCULAR CATHETERIZATION Left 10/23/2016   Procedure: Fistulagram;  Surgeon: Sherren Kerns, MD;  Location: [facility] INVASIVE CV LAB;  Service: Cardiovascular;  Laterality: Left;  . RESECTION OF ARTERIOVENOUS FISTULA ANEURYSM Left 11/22/2015   Procedure: RESECTION OF LEFT RADIOCEPHALIC FISTULA ANEURYSM ;  Surgeon: Nada Libman, MD;  Location: [facility] OR;  Service: Vascular;  Laterality: Left;  . REVISON OF ARTERIOVENOUS FISTULA Left 12/22/2016   Procedure: REVISON OF LEFT ARTERIOVENOUS  FISTULA;  Surgeon: Maeola Harman, MD;  Location: [facility] OR;  Service: Vascular;  Laterality: Left;  . REVISON OF ARTERIOVENOUS FISTULA Left 02/04/2017   Procedure: REVISON OF LEFT UPPER ARM ARTERIOVENOUS FISTULA;  Surgeon: Maeola Harman, MD;  Location: [facility] OR;  Service: Vascular;  Laterality: Left;  . UPPER EXTREMITY ANGIOGRAPHY Bilateral 09/19/2019   Procedure: UPPER EXTREMITY ANGIOGRAPHY;  Surgeon: Renford Dills, MD;  Location: [facility] INVASIVE CV LAB;  Service: Cardiovascular;  Laterality: Bilateral;    Social History   Socioeconomic History  . Marital status: Widowed    Spouse name: Not on file  . Number of children: 2  . Years of education: Not on file  . Highest education level: Not on file  Occupational History  . Not on file  Tobacco Use  . Smoking status: Former Smoker    Types: Cigarettes    Quit date: 12/31/1991    Years since quitting: 28.0  . Smokeless tobacco: Never Used  Substance and Sexual Activity  . Alcohol use: No    Alcohol/week: 0.0 standard drinks  . Drug use: No  . Sexual activity: Not Currently  Other Topics Concern  . Not on file  Social History Narrative   Living with her mother    Right handed   Caffeine: only decaf clears ("no Shreyansh Tiffany sodas" or coffee d/t kidney disease)   Low potassium foods d/t kidney disease   Social Determinants of Health   Financial Resource Strain:   . Difficulty of Paying Living Expenses: Not on file  Food Insecurity:   . Worried About Programme researcher, broadcasting/film/video in the Last Year: Not on file  . Ran Out of Food in the Last Year: Not on file  Transportation Needs:   . Lack of Transportation (Medical): Not on file  . Lack of Transportation (Non-Medical): Not on file  Physical Activity:   . Days of Exercise per Week: Not on file  . Minutes of Exercise per Session: Not on file  Stress:   . Feeling of Stress : Not on file  Social Connections:   . Frequency of Communication with Friends and Family: Not on file   . Frequency of Social Gatherings with Friends and Family: Not on file  . Attends Religious Services: Not on file  . Active Member of Clubs or Organizations: Not on file  . Attends Banker Meetings: Not on file  . Marital Status: Not on file  Intimate Partner Violence:   . Fear of Current or Ex-Partner: Not on file  . Emotionally Abused: Not on file  . Physically Abused: Not on file  . Sexually Abused: Not on file    Family History  Problem Relation Age of Onset  . Colon cancer Brother   . Cancer Brother   . Coronary artery disease Mother 42  . Hyperlipidemia Mother   . Hypertension Mother   . Stroke Maternal Aunt   . Esophageal cancer Neg  Hx   . Stomach cancer Neg Hx   . Rectal cancer Neg Hx     Allergies  Allergen Reactions  . Sulfa Antibiotics Other (See Comments)    Both parents allergic-so will not take  . Adhesive [Tape] Itching     Review of Systems   Review of Systems: Negative Unless Checked Constitutional: [] Weight loss  [] Fever  [] Chills Cardiac: [] Chest pain   []  Atrial Fibrillation  [] Palpitations   [] Shortness of breath when laying flat   [] Shortness of breath with exertion. [] Shortness of breath at rest Vascular:  [] Pain in legs with walking   [] Pain in legs with standing [] Pain in legs when laying flat   [] Claudication    [] Pain in feet when laying flat    [] History of DVT   [] Phlebitis   [] Swelling in legs   [] Varicose veins   [] Non-healing ulcers Pulmonary:   [] Uses home oxygen   [] Productive cough   [] Hemoptysis   [] Wheeze  [] COPD   [] Asthma Neurologic:  [] Dizziness   [] Seizures  [] Blackouts [x] History of stroke   [] History of TIA  [] Aphasia   [] Temporary Blindness   [] Weakness or numbness in arm   [] Weakness or numbness in leg Musculoskeletal:   [] Joint swelling   [] Joint pain   [] Low back pain  []  History of Knee Replacement [x] Arthritis [] back Surgeries  []  Spinal Stenosis    Hematologic:  [] Easy bruising  [] Easy bleeding    [] Hypercoagulable state   [x] Anemic Gastrointestinal:  [] Diarrhea   [] Vomiting  [] Gastroesophageal reflux/heartburn   [] Difficulty swallowing. [] Abdominal pain Genitourinary:  [x] Chronic kidney disease   [] Difficult urination  [] Anuric   [] Blood in urine [] Frequent urination  [] Burning with urination   [] Hematuria Skin:  [] Rashes   [] Ulcers [] Wounds Psychological:  [] History of anxiety   [x]  History of major depression  []  Memory Difficulties      OBJECTIVE:   Physical Exam  BP 111/61 (BP Location: Right Arm)   Pulse 80   Resp 16   Ht 5\' 2"  (1.575 m)   Wt 111 lb (50.3 kg)   BMI 20.30 kg/m   Gen: WD/WN, NAD Head: /AT, No temporalis wasting.  Ear/Nose/Throat: Hearing grossly intact, nares w/o erythema or drainage Eyes: PER, EOMI, sclera nonicteric.  Neck: Supple, no masses.  No JVD.  Pulmonary:  Good air movement, no use of accessory muscles.  Cardiac: RRR Vascular: Very aneurysmal left brachio cephalic AVF Vessel Right Left  Radial Palpable Palpable   Gastrointestinal: soft, non-distended. No guarding/no peritoneal signs.  Musculoskeletal: M/S 5/5 throughout.  No deformity or atrophy.  Neurologic: Pain and light touch intact in extremities.  Symmetrical.  Speech is fluent. Motor exam as listed above. Psychiatric: Judgment intact, Mood & affect appropriate for pt's clinical situation. Dermatologic: No Venous rashes. No Ulcers Noted.  No changes consistent with cellulitis. Lymph : No Cervical lymphadenopathy, no lichenification or skin changes of chronic lymphedema.       ASSESSMENT AND PLAN:  1. ESRD (end stage renal disease) (HCC) Recommend:  At this time the patient does not have appropriate extremity access for dialysis  Patient should have a right brachial axillary created.  The risks, benefits and alternative therapies were reviewed in detail with the patient.  All questions were answered.  The patient agrees to proceed with surgery.    2. Gastro-esophageal  reflux disease without esophagitis Continue PPI as already ordered, this medication has been reviewed and there are no changes at this time.  Avoidence of caffeine and alcohol  Moderate  elevation of the head of the bed   3. Essential hypertension Continue antihypertensive medications as already ordered, these medications have been reviewed and there are no changes at this time.   4. Hyperlipidemia, unspecified hyperlipidemia type Continue statin as ordered and reviewed, no changes at this time    Current Outpatient Medications on File Prior to Visit  Medication Sig Dispense Refill  . acetaminophen (TYLENOL) 500 MG tablet Take 1 tablet (500 mg total) by mouth every 6 (six) hours as needed. (Patient taking differently: Take 500 mg by mouth every 6 (six) hours as needed for mild pain. ) 30 tablet 0  . allopurinol (ZYLOPRIM) 300 MG tablet Take 150 mg by mouth at bedtime.    . ALPRAZolam (XANAX) 0.25 MG tablet Take 0.25 mg by mouth daily as needed for anxiety.     Gean Quint 1 GM 210 MG(Fe) tablet Take 420 mg by mouth 3 (three) times daily with meals.     . B Complex-C-Folic Acid (DIALYVITE 800) 0.8 MG WAFR Take 1 Wafer by mouth daily.  11  . labetalol (NORMODYNE) 200 MG tablet Take 200 mg by mouth 2 (two) times daily.     Marland Kitchen lactulose (CHRONULAC) 10 GM/15ML solution Take 20 g by mouth daily as needed for mild constipation.    Marland Kitchen losartan (COZAAR) 50 MG tablet Take 1 tablet (50 mg total) by mouth at bedtime. 30 tablet 0  . Methoxy PEG-Epoetin Beta (MIRCERA IJ) Mircera    . ondansetron (ZOFRAN) 8 MG tablet Take 8 mg by mouth 2 (two) times daily as needed for nausea or vomiting.   0  . simvastatin (ZOCOR) 20 MG tablet Take 1 tablet (20 mg total) by mouth at bedtime. 30 tablet 0  . zolpidem (AMBIEN) 10 MG tablet Take 10 mg by mouth at bedtime as needed for sleep.     Marland Kitchen amLODipine (NORVASC) 10 MG tablet Take 10 mg by mouth at bedtime.     . cinacalcet (SENSIPAR) 30 MG tablet Take 30 mg by mouth  daily.     No current facility-administered medications on file prior to visit.    There are no Patient Instructions on file for this visit. No follow-ups on file.   Georgiana Spinner, NP  This note was completed with Office manager.  Any errors are purely unintentional.

## 2020-01-19 ENCOUNTER — Encounter: Payer: Self-pay | Admitting: Cardiology

## 2020-01-19 ENCOUNTER — Other Ambulatory Visit: Payer: Self-pay

## 2020-01-19 ENCOUNTER — Ambulatory Visit (INDEPENDENT_AMBULATORY_CARE_PROVIDER_SITE_OTHER): Payer: Medicare Other | Admitting: Cardiology

## 2020-01-19 VITALS — BP 90/50 | HR 75 | Ht 62.0 in | Wt 112.0 lb

## 2020-01-19 DIAGNOSIS — I10 Essential (primary) hypertension: Secondary | ICD-10-CM | POA: Diagnosis not present

## 2020-01-19 DIAGNOSIS — I5189 Other ill-defined heart diseases: Secondary | ICD-10-CM | POA: Diagnosis not present

## 2020-01-19 DIAGNOSIS — Z01810 Encounter for preprocedural cardiovascular examination: Secondary | ICD-10-CM

## 2020-01-19 DIAGNOSIS — Z01818 Encounter for other preprocedural examination: Secondary | ICD-10-CM

## 2020-01-19 NOTE — Progress Notes (Signed)
Cardiology Office Note:    Date:  01/19/2020   ID:  [name], DOB December 04, 1951, MRN [id number]  PCP:  Kathryn Carol, MD  Cardiologist:  Kathryn Sable, MD  Electrophysiologist:  None   Referring MD: Kathryn Carol, MD   Chief Complaint  Patient presents with  . New Patient (Initial Visit)    [name]. after treatment has dizziness and walking for a long period of time. (pt states a provider not listed ([name]) took her off amlodipine). Meds verbally reviewed w/ pt.    History of Present Illness:    [name] is a 69 y.o. female with a hx of hypertension, hyperlipidemia, TIA, end-stage renal disease on hemodialysis who presents for cardiac preop assessment prior to AV fistula procedure.  Patient has had a left arm AV fistula for years.  Had a revision as restenosed in 2018.  The fistula works just fine but dilations/aneurysm has been noted from the left forearm all the way to her shoulder.  She was recently taken off amlodipine due to low blood pressures.  She denies chest pain with exertion but notes occasional shortness of breath when she walks for too long.  She lives in [place] and has some difficulty commuting back and forth to [place].  Patient had an echocardiogram on 12/2019 showed normal systolic function with LVEF of 60 to 65%, mild LVH, grade 2 diastolic dysfunction.    Past Medical History:  Diagnosis Date  . Adenomatous polyp of colon 10/2010, 2006, 2015  . Anemia in CKD (chronic kidney disease) 11/07/2012   s/p blood transfusion.   . Arthritis   . CAD (coronary artery disease)    "something like that"  . CHF (congestive heart failure) ([place])   . Constipation   . Depression with anxiety   . Diverticula, colon   . 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  Had some early rejection then stable function for years, then had slow decline of  function and went back on hemodialysis in 2012.  Gets HD TTS schedule at [facility] still using L forearm AVF.     Marland Kitchen GERD (gastroesophageal reflux disease)   . GI bleed 2017   felt to be ischemic colitis, last colo 2015  . Headache   . Hyperlipidemia   . Hypertension   . Neurologic gait dysfunction   . Neuromuscular disorder (HCC)    neuropathy hand and legs  . Osteoporosis   . Pneumonia   . 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 Pulmonary edema 12/2019  . Stroke [facility]) 11/2015   TIA  . Weight loss, unintentional     Past Surgical History:  Procedure Laterality Date  . AV FISTULA PLACEMENT     for dialysis  . AV FISTULA PLACEMENT Left 11/22/2015   Procedure: ARTERIOVENOUS (AV) FISTULA CREATION-LEFT BRACHIOCEPHALIC;  Surgeon: Serafina Mitchell, MD;  Location: [place];  Service: Vascular;  Laterality: Left;  . BACK SURGERY    . CERVICAL FUSION    . CHOLECYSTECTOMY  12/02/2012   Procedure: LAPAROSCOPIC CHOLECYSTECTOMY WITH INTRAOPERATIVE CHOLANGIOGRAM;  Surgeon: Edward Jolly, MD;  Location: [place];  Service: General;  Laterality: N/A;  . EYE SURGERY Bilateral    cataract surgery  . EYE SURGERY Left 2019   laser  . HEMATOMA EVACUATION Left 12/24/2016   Procedure: EVACUATION HEMATOMA LEFT UPPER  ARM;  Surgeon: Waynetta Sandy, MD;  Location: [place];  Service: Vascular;  Laterality: Left;  . I & D EXTREMITY Left 12/31/2016   Procedure: IRRIGATION AND DEBRIDEMENT EXTREMITY;  Surgeon: Angelia Mould, MD;  Location: [place];  Service: Vascular;  Laterality: Left;  . INSERTION OF DIALYSIS CATHETER Right 12/24/2016   Procedure: INSERTION OF DIALYSIS CATHETER;  Surgeon: Waynetta Sandy, MD;  Location: [place];  Service: Vascular;  Laterality: Right;  . INSERTION OF DIALYSIS CATHETER Right 02/04/2017   Procedure: INSERTION OF DIALYSIS CATHETER;  Surgeon: Waynetta Sandy, MD;  Location: [place];  Service: Vascular;  Laterality:  Right;  . KIDNEY TRANSPLANT  1996  . PERIPHERAL VASCULAR CATHETERIZATION Left 10/23/2016   Procedure: Fistulagram;  Surgeon: Elam Dutch, MD;  Location: [place] [facility];  Service: Cardiovascular;  Laterality: Left;  . RESECTION OF ARTERIOVENOUS FISTULA ANEURYSM Left 11/22/2015   Procedure: RESECTION OF LEFT RADIOCEPHALIC FISTULA ANEURYSM ;  Surgeon: Serafina Mitchell, MD;  Location: [place];  Service: Vascular;  Laterality: Left;  . REVISON OF ARTERIOVENOUS FISTULA Left 12/22/2016   Procedure: REVISON OF LEFT ARTERIOVENOUS FISTULA;  Surgeon: Waynetta Sandy, MD;  Location: [place];  Service: Vascular;  Laterality: Left;  . REVISON OF ARTERIOVENOUS FISTULA Left 02/04/2017   Procedure: REVISON OF LEFT UPPER ARM ARTERIOVENOUS FISTULA;  Surgeon: Waynetta Sandy, MD;  Location: [place];  Service: Vascular;  Laterality: Left;  . UPPER EXTREMITY ANGIOGRAPHY Bilateral 09/19/2019   Procedure: UPPER EXTREMITY ANGIOGRAPHY;  Surgeon: Katha Cabal, MD;  Location: [place] [facility];  Service: Cardiovascular;  Laterality: Bilateral;    Current Medications: Current Meds  Medication Sig  . acetaminophen (TYLENOL) 500 MG tablet Take 1 tablet (500 mg total) by mouth every 6 (six) hours as needed. (Patient taking differently: Take 500 mg by mouth every 6 (six) hours as needed for mild pain. )  . allopurinol (ZYLOPRIM) 300 MG tablet Take 150 mg by mouth at bedtime.  . ALPRAZolam (XANAX) 0.25 MG tablet Take 0.25 mg by mouth daily as needed for anxiety.   Lorin Picket 1 GM 210 MG(Fe) tablet Take 420 mg by mouth 3 (three) times daily with meals.   . B Complex-C-Folic Acid (DIALYVITE 284) 0.8 MG WAFR Take 1 Wafer by mouth daily.  . cinacalcet (SENSIPAR) 30 MG tablet Take 30 mg by mouth daily.  Marland Kitchen lactulose (CHRONULAC) 10 GM/15ML solution Take 20 g by mouth daily as needed for mild constipation.  Marland Kitchen losartan (COZAAR) 50 MG tablet Take 1 tablet (50 mg total) by mouth at bedtime.  . Methoxy PEG-Epoetin  Beta (MIRCERA IJ) Mircera  . ondansetron (ZOFRAN) 8 MG tablet Take 8 mg by mouth 2 (two) times daily as needed for nausea or vomiting.   . simvastatin (ZOCOR) 20 MG tablet Take 1 tablet (20 mg total) by mouth at bedtime.  Marland Kitchen zolpidem (AMBIEN) 10 MG tablet Take 10 mg by mouth at bedtime as needed for sleep.   . [DISCONTINUED] labetalol (NORMODYNE) 200 MG tablet Take 200 mg by mouth 2 (two) times daily.      Allergies:   Sulfa antibiotics and Adhesive [tape]   Social History   Socioeconomic History  . Marital status: Widowed    Spouse name: Not on file  . Number of children: 2  . Years of education: Not on file  . Highest education level: Not on file  Occupational History  . Not on file  Tobacco Use  . Smoking status: Former  Smoker    Types: Cigarettes    Quit date: 12/31/1991    Years since quitting: 28.0  . Smokeless tobacco: Never Used  Substance and Sexual Activity  . Alcohol use: No    Alcohol/week: 0.0 standard drinks  . Drug use: No  . Sexual activity: Not Currently  Other Topics Concern  . Not on file  Social History Narrative   Living with her mother    Right handed   Caffeine: only decaf clears ("no brown sodas" or coffee d/t kidney disease)   Low potassium foods d/t kidney disease   Social Determinants of Health   Financial Resource Strain:   . Difficulty of Paying Living Expenses: Not on file  Food Insecurity:   . Worried About Charity fundraiser in the Last Year: Not on file  . Ran Out of Food in the Last Year: Not on file  Transportation Needs:   . Lack of Transportation (Medical): Not on file  . Lack of Transportation (Non-Medical): Not on file  Physical Activity:   . Days of Exercise per Week: Not on file  . Minutes of Exercise per Session: Not on file  Stress:   . Feeling of Stress : Not on file  Social Connections:   . Frequency of Communication with Friends and Family: Not on file  . Frequency of Social Gatherings with Friends and Family: Not on  file  . Attends Religious Services: Not on file  . Active Member of Clubs or Organizations: Not on file  . Attends Archivist Meetings: Not on file  . Marital Status: Not on file     Family History: The patient's family history includes Cancer in her brother; Colon cancer in her brother; Coronary artery disease (age of onset: 85) in her mother; Hyperlipidemia in her mother; Hypertension in her mother; Stroke in her maternal aunt. There is no history of Esophageal cancer, Stomach cancer, or Rectal cancer.  ROS:   Please see the history of present illness.     All other systems reviewed and are negative.  EKGs/Labs/Other Studies Reviewed:    The following studies were reviewed today:   EKG:  EKG is  ordered today.  The ekg ordered today demonstrates normal sinus rhythm, sinus arrhythmia, otherwise normal ECG.  Recent Labs: 10/11/2019: ALT 12 01/01/2020: B Natriuretic Peptide 1,664.3; Hemoglobin 11.7; Magnesium 2.1; Platelets 254 01/02/2020: BUN 24; Creatinine, Ser 5.57; Potassium 4.0; Sodium 136  Recent Lipid Panel    Component Value Date/Time   CHOL 171 02/24/2017 0155   TRIG 92 02/24/2017 0155   HDL 52 02/24/2017 0155   CHOLHDL 3.3 02/24/2017 0155   VLDL 18 02/24/2017 0155   LDLCALC 101 (H) 02/24/2017 0155    Physical Exam:    VS:  BP (!) 90/50 (BP Location: Right Arm, Patient Position: Sitting, Cuff Size: Normal)   Pulse 75   Ht 5\' 2"  (1.575 m)   Wt 112 lb (50.8 kg)   BMI 20.49 kg/m     Wt Readings from Last 3 Encounters:  01/19/20 112 lb (50.8 kg)  01/16/20 111 lb (50.3 kg)  01/02/20 107 lb 11.2 oz (48.9 kg)     GEN:  Well nourished, well developed in no acute distress HEENT: Normal NECK: No JVD; No carotid bruits LYMPHATICS: No lymphadenopathy CARDIAC: RRR, no murmurs, rubs, gallops, fistula murmur noted RESPIRATORY:  Clear to auscultation without rales, wheezing or rhonchi  ABDOMEN: Soft, non-tender, non-distended MUSCULOSKELETAL:  No edema; No  deformity  SKIN: Warm and  dry NEUROLOGIC:  Alert and oriented x 3 PSYCHIATRIC:  Normal affect   ASSESSMENT:    1. Encounter for pre-operative examination   2. Diastolic dysfunction   3. Essential hypertension   4. Pre-operative cardiovascular examination    PLAN:    In order of problems listed above:  1. 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  She has a history of CVA, CKD, hypertension.  Some dyspnea on exertion.  She is high risk per RCRI values.  Recent echocardiogram showed normal ejection fraction with grade 2 diastolic dysfunction.  We will get Lexiscan myocardial perfusion imaging stress test to evaluate presence of ischemia.  This can be schedule in [place] closer to patient's home.  We will call patient with results of the test.  She can follow-up with me in 6 months or less the result is abnormal at which point we will plan an earlier follow-up visit. 2. Diastolic dysfunction noted on last echocardiogram.  Continue management of chronic medical conditions including blood pressure, hyperlipidemia, CKD. 3. Patient with history of hypertension.  Blood pressure today is low at 90/50.  He is on labetalol and losartan.  I will stop labetalol.  I encouraged her not to take BP medication/losartan today or prior to dialysis session which is scheduled for tomorrow.  She is encouraged to check BP meds at home.  If BP stays low, stopping the losartan will be encouraged.  If it returns elevated we can restart some of the other BP meds.  Follow-up in 6 months.  This note was generated in part or whole with voice recognition software. Voice recognition is usually quite accurate but there are transcription errors that can and very often do occur. I apologize for any typographical errors that were not detected and corrected.  Medication Adjustments/Labs and Tests Ordered: Current medicines are reviewed  at length with the patient today.  Concerns regarding medicines are outlined above.  Orders Placed This Encounter  Procedures  . Myocardial Perfusion Imaging  . EKG 12-Lead   No orders of the defined types were placed in this encounter.   Patient Instructions  Medication Instructions:  - Your physician has recommended you make the following change in your medication:   1) Stop labetolol  *If you need a refill on your cardiac medications before your next appointment, please call your pharmacy*  Lab Work: - none ordered  If you have labs (blood work) drawn today and your tests are completely normal, you will receive your results only by: Marland Kitchen MyChart Message (if you have MyChart) OR . A paper copy in the mail If you have any lab test that is abnormal or we need to change your treatment, we will call you to review the results.  Testing/Procedures: - Your physician has requested that you have a lexiscan myoview [facility] office)  - Leane Call  Your caregiver has ordered a Stress Test with nuclear imaging. The purpose of this test is to evaluate the blood supply to your heart muscle. This procedure is referred to as a "Non-Invasive Stress Test." This is because other than having an IV started in your vein, nothing is inserted or "invades" your body. Cardiac stress tests are done to find areas of poor blood flow to the heart by determining the extent of coronary artery disease (CAD). Some patients exercise on a treadmill, which naturally increases the blood flow to your heart, while others who  are  unable to walk on a treadmill due to physical limitations have a pharmacologic/chemical stress agent called Lexiscan . This medicine will mimic walking on a treadmill by temporarily increasing your coronary blood flow.   Please note: these test may take anywhere between 2-4 hours to complete  Date of Procedure:_____________________________________  Arrival Time for  Procedure:______________________________  Instructions regarding medication:   __x__ : You may take your regular medications the morning of your test with enough water to get them down safely  PLEASE NOTIFY THE OFFICE AT LEAST 24 HOURS IN ADVANCE IF YOU ARE UNABLE TO [place].  [phone] AND  PLEASE NOTIFY NUCLEAR MEDICINE AT [facility] AT LEAST 24 HOURS IN ADVANCE IF YOU ARE UNABLE TO KEEP YOUR APPOINTMENT. [phone]  How to prepare for your Myoview test:  1. Do not eat or drink after midnight 2. No caffeine for 24 hours prior to test 3. No smoking 24 hours prior to test. 4. Your medication may be taken with water.  If your doctor stopped a medication because of this test, do not take that medication. 5. Ladies, please do not wear dresses.  Skirts or pants are appropriate. Please wear a short sleeve shirt. 6. No perfume, cologne or lotion. 7. Wear comfortable walking shoes. No heels!   Follow-Up: At [facility], you and your health needs are our priority.  As part of our continuing mission to provide you with exceptional heart care, we have created designated Provider Care Teams.  These Care Teams include your primary Cardiologist (physician) and Advanced Practice Providers (APPs -  Physician Assistants and Nurse Practitioners) who all work together to provide you with the care you need, when you need it.  Your next appointment:   6 month(s)  The format for your next appointment:   In Person  Provider:   [name], MD  Other Instructions n/a     Signed, Kathryn Sable, MD  01/19/2020 12:48 PM    [place]

## 2020-01-19 NOTE — Patient Instructions (Addendum)
Medication Instructions:  - Your physician has recommended you make the following change in your medication:   1) Stop labetolol  *If you need a refill on your cardiac medications before your next appointment, please call your pharmacy*  Lab Work: - none ordered  If you have labs (blood work) drawn today and your tests are completely normal, you will receive your results only by: Marland Kitchen MyChart Message (if you have MyChart) OR . A paper copy in the mail If you have any lab test that is abnormal or we need to change your treatment, we will call you to review the results.  Testing/Procedures: - Your physician has requested that you have a lexiscan myoview [facility] office)  - Leane Call  Your caregiver has ordered a Stress Test with nuclear imaging. The purpose of this test is to evaluate the blood supply to your heart muscle. This procedure is referred to as a "Non-Invasive Stress Test." This is because other than having an IV started in your vein, nothing is inserted or "invades" your body. Cardiac stress tests are done to find areas of poor blood flow to the heart by determining the extent of coronary artery disease (CAD). Some patients exercise on a treadmill, which naturally increases the blood flow to your heart, while others who are  unable to walk on a treadmill due to physical limitations have a pharmacologic/chemical stress agent called Lexiscan . This medicine will mimic walking on a treadmill by temporarily increasing your coronary blood flow.   Please note: these test may take anywhere between 2-4 hours to complete  Date of Procedure:_____________________________________  Arrival Time for Procedure:______________________________  Instructions regarding medication:   __x__ : You may take your regular medications the morning of your test with enough water to get them down safely  PLEASE NOTIFY THE OFFICE AT LEAST 24 HOURS IN ADVANCE IF YOU ARE UNABLE TO [place].  [phone] AND  PLEASE NOTIFY NUCLEAR MEDICINE AT [facility] AT LEAST 24 HOURS IN ADVANCE IF YOU ARE UNABLE TO KEEP YOUR APPOINTMENT. [phone]  How to prepare for your Myoview test:  1. Do not eat or drink after midnight 2. No caffeine for 24 hours prior to test 3. No smoking 24 hours prior to test. 4. Your medication may be taken with water.  If your doctor stopped a medication because of this test, do not take that medication. 5. Ladies, please do not wear dresses.  Skirts or pants are appropriate. Please wear a short sleeve shirt. 6. No perfume, cologne or lotion. 7. Wear comfortable walking shoes. No heels!   Follow-Up: At [facility], you and your health needs are our priority.  As part of our continuing mission to provide you with exceptional heart care, we have created designated Provider Care Teams.  These Care Teams include your primary Cardiologist (physician) and Advanced Practice Providers (APPs -  Physician Assistants and Nurse Practitioners) who all work together to provide you with the care you need, when you need it.  Your next appointment:   6 month(s)  The format for your next appointment:   In Person  Provider:   [name], MD  Other Instructions n/a

## 2020-01-20 DIAGNOSIS — I5033 Acute on chronic diastolic (congestive) heart failure: Secondary | ICD-10-CM | POA: Diagnosis not present

## 2020-01-20 DIAGNOSIS — Z992 Dependence on renal dialysis: Secondary | ICD-10-CM | POA: Diagnosis not present

## 2020-01-20 DIAGNOSIS — D631 Anemia in chronic kidney disease: Secondary | ICD-10-CM | POA: Diagnosis not present

## 2020-01-20 DIAGNOSIS — N2581 Secondary hyperparathyroidism of renal origin: Secondary | ICD-10-CM | POA: Diagnosis not present

## 2020-01-20 DIAGNOSIS — M6281 Muscle weakness (generalized): Secondary | ICD-10-CM | POA: Diagnosis not present

## 2020-01-20 DIAGNOSIS — I4891 Unspecified atrial fibrillation: Secondary | ICD-10-CM | POA: Diagnosis not present

## 2020-01-20 DIAGNOSIS — E785 Hyperlipidemia, unspecified: Secondary | ICD-10-CM | POA: Diagnosis not present

## 2020-01-20 DIAGNOSIS — I132 Hypertensive heart and chronic kidney disease with heart failure and with stage 5 chronic kidney disease, or end stage renal disease: Secondary | ICD-10-CM | POA: Diagnosis not present

## 2020-01-20 DIAGNOSIS — N186 End stage renal disease: Secondary | ICD-10-CM | POA: Diagnosis not present

## 2020-01-20 DIAGNOSIS — D509 Iron deficiency anemia, unspecified: Secondary | ICD-10-CM | POA: Diagnosis not present

## 2020-01-22 DIAGNOSIS — N2581 Secondary hyperparathyroidism of renal origin: Secondary | ICD-10-CM | POA: Diagnosis not present

## 2020-01-22 DIAGNOSIS — D509 Iron deficiency anemia, unspecified: Secondary | ICD-10-CM | POA: Diagnosis not present

## 2020-01-22 DIAGNOSIS — M6281 Muscle weakness (generalized): Secondary | ICD-10-CM | POA: Diagnosis not present

## 2020-01-22 DIAGNOSIS — I5033 Acute on chronic diastolic (congestive) heart failure: Secondary | ICD-10-CM | POA: Diagnosis not present

## 2020-01-22 DIAGNOSIS — D631 Anemia in chronic kidney disease: Secondary | ICD-10-CM | POA: Diagnosis not present

## 2020-01-22 DIAGNOSIS — I4891 Unspecified atrial fibrillation: Secondary | ICD-10-CM | POA: Diagnosis not present

## 2020-01-22 DIAGNOSIS — N186 End stage renal disease: Secondary | ICD-10-CM | POA: Diagnosis not present

## 2020-01-22 DIAGNOSIS — Z992 Dependence on renal dialysis: Secondary | ICD-10-CM | POA: Diagnosis not present

## 2020-01-22 DIAGNOSIS — I132 Hypertensive heart and chronic kidney disease with heart failure and with stage 5 chronic kidney disease, or end stage renal disease: Secondary | ICD-10-CM | POA: Diagnosis not present

## 2020-01-24 DIAGNOSIS — D631 Anemia in chronic kidney disease: Secondary | ICD-10-CM | POA: Diagnosis not present

## 2020-01-24 DIAGNOSIS — M6281 Muscle weakness (generalized): Secondary | ICD-10-CM | POA: Diagnosis not present

## 2020-01-24 DIAGNOSIS — I132 Hypertensive heart and chronic kidney disease with heart failure and with stage 5 chronic kidney disease, or end stage renal disease: Secondary | ICD-10-CM | POA: Diagnosis not present

## 2020-01-24 DIAGNOSIS — I4891 Unspecified atrial fibrillation: Secondary | ICD-10-CM | POA: Diagnosis not present

## 2020-01-24 DIAGNOSIS — D509 Iron deficiency anemia, unspecified: Secondary | ICD-10-CM | POA: Diagnosis not present

## 2020-01-24 DIAGNOSIS — N2581 Secondary hyperparathyroidism of renal origin: Secondary | ICD-10-CM | POA: Diagnosis not present

## 2020-01-24 DIAGNOSIS — N186 End stage renal disease: Secondary | ICD-10-CM | POA: Diagnosis not present

## 2020-01-24 DIAGNOSIS — Z992 Dependence on renal dialysis: Secondary | ICD-10-CM | POA: Diagnosis not present

## 2020-01-24 DIAGNOSIS — I5033 Acute on chronic diastolic (congestive) heart failure: Secondary | ICD-10-CM | POA: Diagnosis not present

## 2020-01-26 DIAGNOSIS — N2581 Secondary hyperparathyroidism of renal origin: Secondary | ICD-10-CM | POA: Diagnosis not present

## 2020-01-26 DIAGNOSIS — Z992 Dependence on renal dialysis: Secondary | ICD-10-CM | POA: Diagnosis not present

## 2020-01-26 DIAGNOSIS — D509 Iron deficiency anemia, unspecified: Secondary | ICD-10-CM | POA: Diagnosis not present

## 2020-01-26 DIAGNOSIS — D631 Anemia in chronic kidney disease: Secondary | ICD-10-CM | POA: Diagnosis not present

## 2020-01-26 DIAGNOSIS — N186 End stage renal disease: Secondary | ICD-10-CM | POA: Diagnosis not present

## 2020-01-29 DIAGNOSIS — Z992 Dependence on renal dialysis: Secondary | ICD-10-CM | POA: Diagnosis not present

## 2020-01-29 DIAGNOSIS — D631 Anemia in chronic kidney disease: Secondary | ICD-10-CM | POA: Diagnosis not present

## 2020-01-29 DIAGNOSIS — N2581 Secondary hyperparathyroidism of renal origin: Secondary | ICD-10-CM | POA: Diagnosis not present

## 2020-01-29 DIAGNOSIS — D509 Iron deficiency anemia, unspecified: Secondary | ICD-10-CM | POA: Diagnosis not present

## 2020-01-29 DIAGNOSIS — N186 End stage renal disease: Secondary | ICD-10-CM | POA: Diagnosis not present

## 2020-01-31 DIAGNOSIS — D631 Anemia in chronic kidney disease: Secondary | ICD-10-CM | POA: Diagnosis not present

## 2020-01-31 DIAGNOSIS — Z992 Dependence on renal dialysis: Secondary | ICD-10-CM | POA: Diagnosis not present

## 2020-01-31 DIAGNOSIS — I4891 Unspecified atrial fibrillation: Secondary | ICD-10-CM | POA: Diagnosis not present

## 2020-01-31 DIAGNOSIS — N2581 Secondary hyperparathyroidism of renal origin: Secondary | ICD-10-CM | POA: Diagnosis not present

## 2020-01-31 DIAGNOSIS — I132 Hypertensive heart and chronic kidney disease with heart failure and with stage 5 chronic kidney disease, or end stage renal disease: Secondary | ICD-10-CM | POA: Diagnosis not present

## 2020-01-31 DIAGNOSIS — M6281 Muscle weakness (generalized): Secondary | ICD-10-CM | POA: Diagnosis not present

## 2020-01-31 DIAGNOSIS — D509 Iron deficiency anemia, unspecified: Secondary | ICD-10-CM | POA: Diagnosis not present

## 2020-01-31 DIAGNOSIS — I5033 Acute on chronic diastolic (congestive) heart failure: Secondary | ICD-10-CM | POA: Diagnosis not present

## 2020-01-31 DIAGNOSIS — N186 End stage renal disease: Secondary | ICD-10-CM | POA: Diagnosis not present

## 2020-02-02 DIAGNOSIS — N186 End stage renal disease: Secondary | ICD-10-CM | POA: Diagnosis not present

## 2020-02-02 DIAGNOSIS — Z992 Dependence on renal dialysis: Secondary | ICD-10-CM | POA: Diagnosis not present

## 2020-02-02 DIAGNOSIS — N2581 Secondary hyperparathyroidism of renal origin: Secondary | ICD-10-CM | POA: Diagnosis not present

## 2020-02-02 DIAGNOSIS — D509 Iron deficiency anemia, unspecified: Secondary | ICD-10-CM | POA: Diagnosis not present

## 2020-02-02 DIAGNOSIS — D631 Anemia in chronic kidney disease: Secondary | ICD-10-CM | POA: Diagnosis not present

## 2020-02-05 DIAGNOSIS — N186 End stage renal disease: Secondary | ICD-10-CM | POA: Diagnosis not present

## 2020-02-05 DIAGNOSIS — D509 Iron deficiency anemia, unspecified: Secondary | ICD-10-CM | POA: Diagnosis not present

## 2020-02-05 DIAGNOSIS — D631 Anemia in chronic kidney disease: Secondary | ICD-10-CM | POA: Diagnosis not present

## 2020-02-05 DIAGNOSIS — Z992 Dependence on renal dialysis: Secondary | ICD-10-CM | POA: Diagnosis not present

## 2020-02-05 DIAGNOSIS — N2581 Secondary hyperparathyroidism of renal origin: Secondary | ICD-10-CM | POA: Diagnosis not present

## 2020-02-07 DIAGNOSIS — D631 Anemia in chronic kidney disease: Secondary | ICD-10-CM | POA: Diagnosis not present

## 2020-02-07 DIAGNOSIS — N186 End stage renal disease: Secondary | ICD-10-CM | POA: Diagnosis not present

## 2020-02-07 DIAGNOSIS — Z992 Dependence on renal dialysis: Secondary | ICD-10-CM | POA: Diagnosis not present

## 2020-02-07 DIAGNOSIS — N2581 Secondary hyperparathyroidism of renal origin: Secondary | ICD-10-CM | POA: Diagnosis not present

## 2020-02-07 DIAGNOSIS — D509 Iron deficiency anemia, unspecified: Secondary | ICD-10-CM | POA: Diagnosis not present

## 2020-02-09 DIAGNOSIS — D509 Iron deficiency anemia, unspecified: Secondary | ICD-10-CM | POA: Diagnosis not present

## 2020-02-09 DIAGNOSIS — N186 End stage renal disease: Secondary | ICD-10-CM | POA: Diagnosis not present

## 2020-02-09 DIAGNOSIS — N2581 Secondary hyperparathyroidism of renal origin: Secondary | ICD-10-CM | POA: Diagnosis not present

## 2020-02-09 DIAGNOSIS — D631 Anemia in chronic kidney disease: Secondary | ICD-10-CM | POA: Diagnosis not present

## 2020-02-09 DIAGNOSIS — Z992 Dependence on renal dialysis: Secondary | ICD-10-CM | POA: Diagnosis not present

## 2020-02-10 DIAGNOSIS — Z23 Encounter for immunization: Secondary | ICD-10-CM | POA: Diagnosis not present

## 2020-02-12 DIAGNOSIS — D509 Iron deficiency anemia, unspecified: Secondary | ICD-10-CM | POA: Diagnosis not present

## 2020-02-12 DIAGNOSIS — N2581 Secondary hyperparathyroidism of renal origin: Secondary | ICD-10-CM | POA: Diagnosis not present

## 2020-02-12 DIAGNOSIS — D631 Anemia in chronic kidney disease: Secondary | ICD-10-CM | POA: Diagnosis not present

## 2020-02-12 DIAGNOSIS — T8612 Kidney transplant failure: Secondary | ICD-10-CM | POA: Diagnosis not present

## 2020-02-12 DIAGNOSIS — Z992 Dependence on renal dialysis: Secondary | ICD-10-CM | POA: Diagnosis not present

## 2020-02-12 DIAGNOSIS — N186 End stage renal disease: Secondary | ICD-10-CM | POA: Diagnosis not present

## 2020-02-14 DIAGNOSIS — D509 Iron deficiency anemia, unspecified: Secondary | ICD-10-CM | POA: Diagnosis not present

## 2020-02-14 DIAGNOSIS — D631 Anemia in chronic kidney disease: Secondary | ICD-10-CM | POA: Diagnosis not present

## 2020-02-14 DIAGNOSIS — Z992 Dependence on renal dialysis: Secondary | ICD-10-CM | POA: Diagnosis not present

## 2020-02-14 DIAGNOSIS — N186 End stage renal disease: Secondary | ICD-10-CM | POA: Diagnosis not present

## 2020-02-14 DIAGNOSIS — N2581 Secondary hyperparathyroidism of renal origin: Secondary | ICD-10-CM | POA: Diagnosis not present

## 2020-02-15 ENCOUNTER — Other Ambulatory Visit: Payer: Self-pay

## 2020-02-15 ENCOUNTER — Encounter: Payer: Self-pay | Admitting: Adult Health

## 2020-02-15 ENCOUNTER — Ambulatory Visit (INDEPENDENT_AMBULATORY_CARE_PROVIDER_SITE_OTHER): Payer: Medicare Other | Admitting: Adult Health

## 2020-02-15 VITALS — BP 133/71 | HR 93 | Temp 97.8°F | Ht 62.0 in | Wt 112.4 lb

## 2020-02-15 DIAGNOSIS — R413 Other amnesia: Secondary | ICD-10-CM

## 2020-02-15 DIAGNOSIS — Z8673 Personal history of transient ischemic attack (TIA), and cerebral infarction without residual deficits: Secondary | ICD-10-CM

## 2020-02-15 NOTE — Progress Notes (Addendum)
[facility]    Provider:  Dr Jaynee Eagles Referring Provider: [name], MD Primary Care Physician:  [name], MD  CC:  Stroke follow up  HPI:  [name] is a 69 year old female who is being seen today, 02/15/2020, with prior stroke history and previously seen for memory loss concerns and dizziness. She has been doing well since prior visit recently completing therapy due to prior generalized weakness causing fall. Reports ambulation improved and is now ambulating with cane or RW. She does continue to have some dizziness usually around HD treatment times. Reports memory/cognition has been stable. Stable from stroke standpoint. Continues on simvastatin without myalgias. Stopped aspirin as recommended by nephrology. Blood pressure today 133/71. Fluctuates with HD treatments.    History provided for reference purposes only Interval history 06/20/2019 Dr Jaynee Eagles:  70 year old seen here for memory changes in the past,  and today new referral for some vague dizziness.She has extreme stress and caring for her mother with dementia. PMHx stroke, HTN, HLD, headache, depression with anxiety, ESRD on dialysis, anemia, CHF, HTN, HLD, prior CVA. She says her ears are "juicy" and she has to have her ears cleaned out. She says she has some dizziness, last vertigo was 3 months ago. She haas dizziness when she gets off of dialysis, no other times other than when walking and turns the wrong way. No vertigo for months. Dizziness is mild and brief and rare, and resolves on it own. She does not drink fluids, she tries not to drink fluids. She is on a fixed income, she does not have the $200 for this appointment and doesn't think it is neurologic. She denies any other association with her dizziness.  MRI brain: 09/16/2018: IMPRESSION: reviewed images and agree with the following 1. No acute intracranial process. 2. Multiple old supra and lacunar infarcts. Moderate to severe chronic small vessel  ischemic changes. 3. Chronic microhemorrhages in a distribution suggestive of chronic hypertension, less likely amyloid angiopathy. 4. Mild parenchymal brain volume loss.  07/13/2018 Dr. Jaynee Eagles:  [name] is a 69 y.o. female here as a referral from Dr. Delfina Redwood for new problem memory loss. PMHx . Since her stroke she feels like her memory is impaired. Her mother has dementia and she is caring for her so there is stress. She cares for her mother. She has difficulty with long-term memory, trying to remember things that used to be. She says her mother gets on her nerves and makes her forget things, she talks awful to her. Doesn't have any problems with year or month but may have difficulty with day. No repeating things. She is caring for the household, cleaning, shopping. She does not miss medications and she manage mom's medications. Her son pays bills. She drive, no accidents, no accidents in the home. She forgets what day it is. Mom with dementia unknown what kind. Progressively worsening. She has personality changes, aggravated, saying things that she wouldn't, agitation.   Reviewed notes, labs and imaging from outside physicians, which showed:  Reviewed CT of the head images and agree:  1. Scattered white matter changes. No acute intracranial abnormality. 2. Widening of the pre odontoid space and malalignment of the first and second spinal laminal lines, unchanged since 2015. This is not an acute finding and the patient reports no trauma. Recommend clinical correlation. Dedicated CT imaging of the cervical spine could of course better evaluate if clinically warranted.  Review of Systems: Patient complains of symptoms per HPI as well as the  following symptoms: dizziness. Pertinent negatives and positives per HPI. All others negative.   Social History   Socioeconomic History  . Marital status: Widowed    Spouse name: Not on file  . Number of children: 2  . Years of education: Not on file   . Highest education level: Not on file  Occupational History  . Not on file  Tobacco Use  . Smoking status: Former Smoker    Types: Cigarettes    Quit date: 12/31/1991    Years since quitting: 28.1  . Smokeless tobacco: Never Used  Substance and Sexual Activity  . Alcohol use: No    Alcohol/week: 0.0 standard drinks  . Drug use: No  . Sexual activity: Not Currently  Other Topics Concern  . Not on file  Social History Narrative   Living with her mother    Right handed   Caffeine: only decaf clears ("no brown sodas" or coffee d/t kidney disease)   Low potassium foods d/t kidney disease   Social Determinants of Health   Financial Resource Strain:   . Difficulty of Paying Living Expenses: Not on file  Food Insecurity:   . Worried About Charity fundraiser in the Last Year: Not on file  . Ran Out of Food in the Last Year: Not on file  Transportation Needs:   . Lack of Transportation (Medical): Not on file  . Lack of Transportation (Non-Medical): Not on file  Physical Activity:   . Days of Exercise per Week: Not on file  . Minutes of Exercise per Session: Not on file  Stress:   . Feeling of Stress : Not on file  Social Connections:   . Frequency of Communication with Friends and Family: Not on file  . Frequency of Social Gatherings with Friends and Family: Not on file  . Attends Religious Services: Not on file  . Active Member of Clubs or Organizations: Not on file  . Attends Archivist Meetings: Not on file  . Marital Status: Not on file  Intimate Partner Violence:   . Fear of Current or Ex-Partner: Not on file  . Emotionally Abused: Not on file  . Physically Abused: Not on file  . Sexually Abused: Not on file    Family History  Problem Relation Age of Onset  . Colon cancer Brother   . Cancer Brother   . Coronary artery disease Mother 24  . Hyperlipidemia Mother   . Hypertension Mother   . Stroke Maternal Aunt   . Esophageal cancer Neg Hx   . Stomach  cancer Neg Hx   . Rectal cancer Neg Hx     Past Medical History:  Diagnosis Date  . Adenomatous polyp of colon 10/2010, 2006, 2015  . Anemia in CKD (chronic kidney disease) 11/07/2012   s/p blood transfusion.   . Arthritis   . CAD (coronary artery disease)    "something like that"  . CHF (congestive heart failure) ([place])   . Constipation   . Depression with anxiety   . Diverticula, colon   . 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  Had some early rejection then stable function for years, then had slow decline of function and went back on hemodialysis in 2012.  Gets HD TTS schedule at [facility] 2 on [facility] still using L forearm AVF.     Marland Kitchen GERD (gastroesophageal reflux disease)   . GI bleed 2017  felt to be ischemic colitis, last colo 2015  . Headache   . Hyperlipidemia   . Hypertension   . Neurologic gait dysfunction   . Neuromuscular disorder (HCC)    neuropathy hand and legs  . Osteoporosis   . Pneumonia   . 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 Pulmonary edema 12/2019  . Stroke [facility]) 11/2015   TIA  . Weight loss, unintentional     Past Surgical History:  Procedure Laterality Date  . AV FISTULA PLACEMENT     for dialysis  . AV FISTULA PLACEMENT Left 11/22/2015   Procedure: ARTERIOVENOUS (AV) FISTULA CREATION-LEFT BRACHIOCEPHALIC;  Surgeon: Serafina Mitchell, MD;  Location: [place];  Service: Vascular;  Laterality: Left;  . BACK SURGERY    . CERVICAL FUSION    . CHOLECYSTECTOMY  12/02/2012   Procedure: LAPAROSCOPIC CHOLECYSTECTOMY WITH INTRAOPERATIVE CHOLANGIOGRAM;  Surgeon: Edward Jolly, MD;  Location: [place];  Service: General;  Laterality: N/A;  . EYE SURGERY Bilateral    cataract surgery  . EYE SURGERY Left 2019   laser  . HEMATOMA EVACUATION Left 12/24/2016   Procedure: EVACUATION HEMATOMA LEFT UPPER ARM;  Surgeon: Waynetta Sandy,  MD;  Location: [place];  Service: Vascular;  Laterality: Left;  . I & D EXTREMITY Left 12/31/2016   Procedure: IRRIGATION AND DEBRIDEMENT EXTREMITY;  Surgeon: Angelia Mould, MD;  Location: [place];  Service: Vascular;  Laterality: Left;  . INSERTION OF DIALYSIS CATHETER Right 12/24/2016   Procedure: INSERTION OF DIALYSIS CATHETER;  Surgeon: Waynetta Sandy, MD;  Location: [place];  Service: Vascular;  Laterality: Right;  . INSERTION OF DIALYSIS CATHETER Right 02/04/2017   Procedure: INSERTION OF DIALYSIS CATHETER;  Surgeon: Waynetta Sandy, MD;  Location: [place];  Service: Vascular;  Laterality: Right;  . KIDNEY TRANSPLANT  1996  . PERIPHERAL VASCULAR CATHETERIZATION Left 10/23/2016   Procedure: Fistulagram;  Surgeon: Elam Dutch, MD;  Location: [facility];  Service: Cardiovascular;  Laterality: Left;  . RESECTION OF ARTERIOVENOUS FISTULA ANEURYSM Left 11/22/2015   Procedure: RESECTION OF LEFT RADIOCEPHALIC FISTULA ANEURYSM ;  Surgeon: Serafina Mitchell, MD;  Location: [place];  Service: Vascular;  Laterality: Left;  . REVISON OF ARTERIOVENOUS FISTULA Left 12/22/2016   Procedure: REVISON OF LEFT ARTERIOVENOUS FISTULA;  Surgeon: Waynetta Sandy, MD;  Location: [place];  Service: Vascular;  Laterality: Left;  . REVISON OF ARTERIOVENOUS FISTULA Left 02/04/2017   Procedure: REVISON OF LEFT UPPER ARM ARTERIOVENOUS FISTULA;  Surgeon: Waynetta Sandy, MD;  Location: [place];  Service: Vascular;  Laterality: Left;  . UPPER EXTREMITY ANGIOGRAPHY Bilateral 09/19/2019   Procedure: UPPER EXTREMITY ANGIOGRAPHY;  Surgeon: Katha Cabal, MD;  Location: [place] [facility];  Service: Cardiovascular;  Laterality: Bilateral;    Current Outpatient Medications  Medication Sig Dispense Refill  . acetaminophen (TYLENOL) 500 MG tablet Take 1 tablet (500 mg total) by mouth every 6 (six) hours as needed. (Patient taking differently: Take 500 mg by mouth every 6 (six) hours as  needed for mild pain. ) 30 tablet 0  . allopurinol (ZYLOPRIM) 300 MG tablet Take 150 mg by mouth at bedtime.    . ALPRAZolam (XANAX) 0.25 MG tablet Take 0.25 mg by mouth daily as needed for anxiety.     Lorin Picket 1 GM 210 MG(Fe) tablet Take 420 mg by mouth 3 (three) times daily with meals.     . B Complex-C-Folic Acid (DIALYVITE  800) 0.8 MG WAFR Take 1 Wafer by mouth daily.  11  . cinacalcet (SENSIPAR) 30 MG tablet Take 30 mg by mouth daily.    Marland Kitchen labetalol (NORMODYNE) 200 MG tablet Take 100 mg by mouth at bedtime.    Marland Kitchen lactulose (CHRONULAC) 10 GM/15ML solution Take 20 g by mouth daily as needed for mild constipation.    Marland Kitchen losartan (COZAAR) 50 MG tablet Take 1 tablet (50 mg total) by mouth at bedtime. 30 tablet 0  . Methoxy PEG-Epoetin Beta (MIRCERA IJ) Mircera    . ondansetron (ZOFRAN) 8 MG tablet Take 8 mg by mouth 2 (two) times daily as needed for nausea or vomiting.   0  . simvastatin (ZOCOR) 20 MG tablet Take 1 tablet (20 mg total) by mouth at bedtime. 30 tablet 0  . zolpidem (AMBIEN) 10 MG tablet Take 10 mg by mouth at bedtime as needed for sleep.      No current facility-administered medications for this visit.    Allergies as of 02/15/2020 - Review Complete 02/15/2020  Allergen Reaction Noted  . Sulfa antibiotics Other (See Comments) 11/06/2012  . Adhesive [tape] Itching 12/29/2016    Vitals: BP 133/71   Pulse 93   Temp 97.8 F (36.6 C) (Oral)   Ht 5\' 2"  (1.575 m)   Wt 112 lb 6.4 oz (51 kg)   BMI 20.56 kg/m  Last Weight:  Wt Readings from Last 1 Encounters:  02/15/20 112 lb 6.4 oz (51 kg)   Last Height:   Ht Readings from Last 1 Encounters:  02/15/20 5\' 2"  (1.575 m)   Physical exam: Exam: Gen: NAD CV: RRR, no MRG. No Carotid Bruits. No peripheral edema, warm, nontender Eyes: Conjunctivae clear without exudates or hemorrhage  Neuro: Detailed Neurologic Exam  Speech:    Speech is normal; fluent and spontaneous with normal comprehension.  Cognition:    The  patient is oriented to person, place, and time;     recent and remote memory impaired;     language fluent;     Impaired attention, concentration, fund of knowledge Cranial Nerves:    The pupils are equal, round, and reactive to light. Attempted fundosopic exam could not visualize. Visual fields are full to finger confrontation. Extraocular movements are intact. Trigeminal sensation is intact and the muscles of mastication are normal. The face is symmetric. The palate elevates in the midline. Hearing intact. Voice is normal. Shoulder shrug is normal. The tongue has normal motion without fasciculations.   Gait:    slighty antalgic, with a cane, stable  Motor Observation:    No asymmetry, no atrophy, and no involuntary movements noted. Tone:    Normal muscle tone.    Posture:    Posture is normal. normal erect    Strength:    Very poor effort but strength appears symmetric     Sensation: intact to LT     Reflex Exam:  DTR's:    Deep tendon reflexes in the upper and lower extremities are symmetric bilaterally.   Toes:    The toes are equiv bilaterally.   Clonus:    Clonus is absent.       Assessment/Plan:   69 year old seen here for memory changes in the past now stable as well as prior visit for dizziness concerns which was felt likely secondary to HD treatments. PMHx stroke, HTN, HLD, headache, depression with anxiety, ESRD on dialysis, anemia, CHF, HTN, HLD, prior CVA.  Overall currently stable from a neurological standpoint Continue  ongoing risk factor management for secondary stroke prevention Please call office with any worsening cognitive concerns for possible further memory evaluation.  Currently stable therefore no indication to pursue further work-up  Follow-up as needed with Dr. Jaynee Eagles  I spent 20 minutes of face-to-face and non-face-to-face time with patient.  This included previsit chart review, lab review, study review, order entry, electronic health record  documentation, patient education  Frann Rider, [facility]  [facility] [address] [place], [place] [address]  Phone [phone] Fax [phone] Note: This document was prepared with digital dictation and possible smart phrase technology. Any transcriptional errors that result from this process are unintentional.  Made any corrections needed, and agree with history, physical, neuro exam,assessment and plan as stated.     Sarina Ill, MD [facility]

## 2020-02-16 DIAGNOSIS — Z992 Dependence on renal dialysis: Secondary | ICD-10-CM | POA: Diagnosis not present

## 2020-02-16 DIAGNOSIS — D631 Anemia in chronic kidney disease: Secondary | ICD-10-CM | POA: Diagnosis not present

## 2020-02-16 DIAGNOSIS — N2581 Secondary hyperparathyroidism of renal origin: Secondary | ICD-10-CM | POA: Diagnosis not present

## 2020-02-16 DIAGNOSIS — N186 End stage renal disease: Secondary | ICD-10-CM | POA: Diagnosis not present

## 2020-02-16 DIAGNOSIS — D509 Iron deficiency anemia, unspecified: Secondary | ICD-10-CM | POA: Diagnosis not present

## 2020-02-19 ENCOUNTER — Telehealth (HOSPITAL_COMMUNITY): Payer: Self-pay

## 2020-02-19 DIAGNOSIS — N2581 Secondary hyperparathyroidism of renal origin: Secondary | ICD-10-CM | POA: Diagnosis not present

## 2020-02-19 DIAGNOSIS — D509 Iron deficiency anemia, unspecified: Secondary | ICD-10-CM | POA: Diagnosis not present

## 2020-02-19 DIAGNOSIS — N186 End stage renal disease: Secondary | ICD-10-CM | POA: Diagnosis not present

## 2020-02-19 DIAGNOSIS — Z992 Dependence on renal dialysis: Secondary | ICD-10-CM | POA: Diagnosis not present

## 2020-02-19 DIAGNOSIS — D631 Anemia in chronic kidney disease: Secondary | ICD-10-CM | POA: Diagnosis not present

## 2020-02-19 NOTE — Telephone Encounter (Signed)
Detailed instructions left on the patient's answering machine, asked to call back with any questions. S.Makailee Nudelman EMTP

## 2020-02-20 ENCOUNTER — Other Ambulatory Visit: Payer: Self-pay

## 2020-02-20 ENCOUNTER — Ambulatory Visit (HOSPITAL_COMMUNITY): Payer: Medicare Other | Attending: Cardiology

## 2020-02-20 DIAGNOSIS — Z01810 Encounter for preprocedural cardiovascular examination: Secondary | ICD-10-CM

## 2020-02-20 LAB — MYOCARDIAL PERFUSION IMAGING
LV dias vol: 64 mL (ref 46–106)
LV sys vol: 20 mL
Peak HR: 94 {beats}/min
Rest HR: 85 {beats}/min
SDS: 1
SRS: 2
SSS: 3
TID: 1.12

## 2020-02-20 MED ORDER — TECHNETIUM TC 99M TETROFOSMIN IV KIT
11.0000 | PACK | Freq: Once | INTRAVENOUS | Status: AC | PRN
  Administered 2020-02-20: 11 via INTRAVENOUS
  Filled 2020-02-20: qty 11

## 2020-02-20 MED ORDER — REGADENOSON 0.4 MG/5ML IV SOLN
0.4000 mg | Freq: Once | INTRAVENOUS | Status: AC
  Administered 2020-02-20: 0.4 mg via INTRAVENOUS

## 2020-02-20 MED ORDER — TECHNETIUM TC 99M TETROFOSMIN IV KIT
31.5000 | PACK | Freq: Once | INTRAVENOUS | Status: AC | PRN
  Administered 2020-02-20: 31.5 via INTRAVENOUS
  Filled 2020-02-20: qty 32

## 2020-02-21 DIAGNOSIS — Z992 Dependence on renal dialysis: Secondary | ICD-10-CM | POA: Diagnosis not present

## 2020-02-21 DIAGNOSIS — D509 Iron deficiency anemia, unspecified: Secondary | ICD-10-CM | POA: Diagnosis not present

## 2020-02-21 DIAGNOSIS — N186 End stage renal disease: Secondary | ICD-10-CM | POA: Diagnosis not present

## 2020-02-21 DIAGNOSIS — N2581 Secondary hyperparathyroidism of renal origin: Secondary | ICD-10-CM | POA: Diagnosis not present

## 2020-02-21 DIAGNOSIS — D631 Anemia in chronic kidney disease: Secondary | ICD-10-CM | POA: Diagnosis not present

## 2020-02-23 DIAGNOSIS — Z992 Dependence on renal dialysis: Secondary | ICD-10-CM | POA: Diagnosis not present

## 2020-02-23 DIAGNOSIS — D509 Iron deficiency anemia, unspecified: Secondary | ICD-10-CM | POA: Diagnosis not present

## 2020-02-23 DIAGNOSIS — D631 Anemia in chronic kidney disease: Secondary | ICD-10-CM | POA: Diagnosis not present

## 2020-02-23 DIAGNOSIS — N186 End stage renal disease: Secondary | ICD-10-CM | POA: Diagnosis not present

## 2020-02-23 DIAGNOSIS — N2581 Secondary hyperparathyroidism of renal origin: Secondary | ICD-10-CM | POA: Diagnosis not present

## 2020-02-26 DIAGNOSIS — N2581 Secondary hyperparathyroidism of renal origin: Secondary | ICD-10-CM | POA: Diagnosis not present

## 2020-02-26 DIAGNOSIS — Z992 Dependence on renal dialysis: Secondary | ICD-10-CM | POA: Diagnosis not present

## 2020-02-26 DIAGNOSIS — N186 End stage renal disease: Secondary | ICD-10-CM | POA: Diagnosis not present

## 2020-02-26 DIAGNOSIS — D631 Anemia in chronic kidney disease: Secondary | ICD-10-CM | POA: Diagnosis not present

## 2020-02-26 DIAGNOSIS — D509 Iron deficiency anemia, unspecified: Secondary | ICD-10-CM | POA: Diagnosis not present

## 2020-02-28 DIAGNOSIS — N2581 Secondary hyperparathyroidism of renal origin: Secondary | ICD-10-CM | POA: Diagnosis not present

## 2020-02-28 DIAGNOSIS — N186 End stage renal disease: Secondary | ICD-10-CM | POA: Diagnosis not present

## 2020-02-28 DIAGNOSIS — Z992 Dependence on renal dialysis: Secondary | ICD-10-CM | POA: Diagnosis not present

## 2020-02-28 DIAGNOSIS — D509 Iron deficiency anemia, unspecified: Secondary | ICD-10-CM | POA: Diagnosis not present

## 2020-02-28 DIAGNOSIS — D631 Anemia in chronic kidney disease: Secondary | ICD-10-CM | POA: Diagnosis not present

## 2020-03-01 DIAGNOSIS — Z992 Dependence on renal dialysis: Secondary | ICD-10-CM | POA: Diagnosis not present

## 2020-03-01 DIAGNOSIS — D631 Anemia in chronic kidney disease: Secondary | ICD-10-CM | POA: Diagnosis not present

## 2020-03-01 DIAGNOSIS — D509 Iron deficiency anemia, unspecified: Secondary | ICD-10-CM | POA: Diagnosis not present

## 2020-03-01 DIAGNOSIS — N186 End stage renal disease: Secondary | ICD-10-CM | POA: Diagnosis not present

## 2020-03-01 DIAGNOSIS — N2581 Secondary hyperparathyroidism of renal origin: Secondary | ICD-10-CM | POA: Diagnosis not present

## 2020-03-04 DIAGNOSIS — Z992 Dependence on renal dialysis: Secondary | ICD-10-CM | POA: Diagnosis not present

## 2020-03-04 DIAGNOSIS — N2581 Secondary hyperparathyroidism of renal origin: Secondary | ICD-10-CM | POA: Diagnosis not present

## 2020-03-04 DIAGNOSIS — N186 End stage renal disease: Secondary | ICD-10-CM | POA: Diagnosis not present

## 2020-03-04 DIAGNOSIS — D509 Iron deficiency anemia, unspecified: Secondary | ICD-10-CM | POA: Diagnosis not present

## 2020-03-04 DIAGNOSIS — D631 Anemia in chronic kidney disease: Secondary | ICD-10-CM | POA: Diagnosis not present

## 2020-03-06 ENCOUNTER — Telehealth (INDEPENDENT_AMBULATORY_CARE_PROVIDER_SITE_OTHER): Payer: Self-pay

## 2020-03-06 DIAGNOSIS — D509 Iron deficiency anemia, unspecified: Secondary | ICD-10-CM | POA: Diagnosis not present

## 2020-03-06 DIAGNOSIS — D631 Anemia in chronic kidney disease: Secondary | ICD-10-CM | POA: Diagnosis not present

## 2020-03-06 DIAGNOSIS — N2581 Secondary hyperparathyroidism of renal origin: Secondary | ICD-10-CM | POA: Diagnosis not present

## 2020-03-06 DIAGNOSIS — N186 End stage renal disease: Secondary | ICD-10-CM | POA: Diagnosis not present

## 2020-03-06 DIAGNOSIS — Z992 Dependence on renal dialysis: Secondary | ICD-10-CM | POA: Diagnosis not present

## 2020-03-06 NOTE — Telephone Encounter (Signed)
I attempted to contact the patient on 03/04/20 and again today to get her scheduled for surgery with Dr. Delana Meyer. A message was left each time for a return call. Patient returned my call and is now scheduled for surgery on 03/15/20 with Dr. Delana Meyer. Patient will do a phone call on 03/08/20 between 8-1 pm and covid testing on 03/13/20 between 8-1 pm at the [place]. Pre-surgical instructions were discussed and will be mailed to the patient's home.

## 2020-03-07 ENCOUNTER — Other Ambulatory Visit (INDEPENDENT_AMBULATORY_CARE_PROVIDER_SITE_OTHER): Payer: Self-pay | Admitting: Nurse Practitioner

## 2020-03-08 ENCOUNTER — Other Ambulatory Visit: Payer: Self-pay

## 2020-03-08 ENCOUNTER — Encounter
Admission: RE | Admit: 2020-03-08 | Discharge: 2020-03-08 | Disposition: A | Discharge status: Home / Self Care | Payer: Medicare Other | Source: Ambulatory Visit | Attending: Vascular Surgery | Admitting: Vascular Surgery

## 2020-03-08 DIAGNOSIS — Z992 Dependence on renal dialysis: Secondary | ICD-10-CM | POA: Diagnosis not present

## 2020-03-08 DIAGNOSIS — D631 Anemia in chronic kidney disease: Secondary | ICD-10-CM | POA: Diagnosis not present

## 2020-03-08 DIAGNOSIS — N2581 Secondary hyperparathyroidism of renal origin: Secondary | ICD-10-CM | POA: Diagnosis not present

## 2020-03-08 DIAGNOSIS — N186 End stage renal disease: Secondary | ICD-10-CM | POA: Diagnosis not present

## 2020-03-08 DIAGNOSIS — Z01818 Encounter for other preprocedural examination: Secondary | ICD-10-CM | POA: Insufficient documentation

## 2020-03-08 DIAGNOSIS — D509 Iron deficiency anemia, unspecified: Secondary | ICD-10-CM | POA: Diagnosis not present

## 2020-03-08 NOTE — Patient Instructions (Signed)
Your procedure is scheduled on: Friday March 15, 2020 Report to Day Surgery. To find out your arrival time please call [phone] between 1PM - 3PM on Thursday April, .  Remember: Instructions that are not followed completely may result in serious medical risk,  up to and including death, or upon the discretion of your surgeon and anesthesiologist your  surgery may need to be rescheduled.     _X__ 1. Do not eat food after midnight the night before your procedure.                 No gum chewing or hard candies. You may drink clear liquids up to 2 hours                 before you are scheduled to arrive for your surgery- DO not drink clear                 liquids within 2 hours of the start of your surgery.                 Clear Liquids include:  water, apple juice without pulp, clear Gatorade, G2 or                  Gatorade Zero (avoid Red/Purple/Blue), Black Coffee or Tea (Do not add                 anything to coffee or tea).  __X__2.  On the morning of surgery brush your teeth with toothpaste and water, you                may rinse your mouth with mouthwash if you wish.  Do not swallow any toothpaste of mouthwash.     _X__ 3.  No Alcohol for 24 hours before or after surgery.   _X__ 4.  Do Not Smoke or use e-cigarettes For 24 Hours Prior to Your Surgery.                 Do not use any chewable tobacco products for at least 6 hours prior to                 surgery.  __x__  5.  Notify your doctor if there is any change in your medical condition      (cold, fever, infections).     Do not wear jewelry, make-up, hairpins, clips or nail polish. Do not wear lotions, powders, or perfumes. You may wear deodorant. Do not shave 48 hours prior to surgery. Men may shave face and neck. Do not bring valuables to the hospital.     [facility] is not responsible for any belongings or valuables.  Contacts, dentures or bridgework may not be worn into surgery. Leave your  suitcase in the car. After surgery it may be brought to your room. For patients admitted to the hospital, discharge time is determined by your treatment team.   Patients discharged the day of surgery will not be allowed to drive home.   Make arrangements for someone to be with you for the first 24 hours of your Same Day Discharge.   ___ Take these medicines the morning of surgery with A SIP OF WATER:    1. None     __x__ Use CHG Soap as directed  __x__ Stop Anti-inflammatories such as ibuprofen, Aleve, naproxen, aspirin and or BC powders.   __x__ Stop supplements until after surgery.    __x__ Do not start any herbal supplements  before your surgery.

## 2020-03-09 DIAGNOSIS — Z23 Encounter for immunization: Secondary | ICD-10-CM | POA: Diagnosis not present

## 2020-03-11 DIAGNOSIS — Z992 Dependence on renal dialysis: Secondary | ICD-10-CM | POA: Diagnosis not present

## 2020-03-11 DIAGNOSIS — N186 End stage renal disease: Secondary | ICD-10-CM | POA: Diagnosis not present

## 2020-03-11 DIAGNOSIS — D509 Iron deficiency anemia, unspecified: Secondary | ICD-10-CM | POA: Diagnosis not present

## 2020-03-11 DIAGNOSIS — N2581 Secondary hyperparathyroidism of renal origin: Secondary | ICD-10-CM | POA: Diagnosis not present

## 2020-03-11 DIAGNOSIS — D631 Anemia in chronic kidney disease: Secondary | ICD-10-CM | POA: Diagnosis not present

## 2020-03-12 MED ORDER — LIDOCAINE HCL (PF) 1 % IJ SOLN
5.0000 mL | INTRAMUSCULAR | Status: DC | PRN
  Filled 2020-03-12: qty 5

## 2020-03-12 MED ORDER — SODIUM CHLORIDE 0.9 % IV SOLN
100.0000 mL | INTRAVENOUS | Status: DC | PRN
  Filled 2020-03-12: qty 100

## 2020-03-12 MED ORDER — LIDOCAINE-PRILOCAINE 2.5-2.5 % EX CREA
1.0000 "application " | TOPICAL_CREAM | CUTANEOUS | Status: DC | PRN
  Filled 2020-03-12: qty 5

## 2020-03-12 MED ORDER — ALTEPLASE 2 MG IJ SOLR
2.0000 mg | Freq: Once | INTRAMUSCULAR | Status: DC | PRN
  Filled 2020-03-12: qty 2

## 2020-03-12 MED ORDER — HEPARIN SODIUM (PORCINE) 1000 UNIT/ML DIALYSIS
3000.0000 [IU] | INTRAMUSCULAR | Status: DC | PRN
  Filled 2020-03-12: qty 3

## 2020-03-12 MED ORDER — PENTAFLUOROPROP-TETRAFLUOROETH EX AERO
1.0000 "application " | INHALATION_SPRAY | CUTANEOUS | Status: DC | PRN
  Filled 2020-03-12: qty 30

## 2020-03-12 MED ORDER — HEPARIN SODIUM (PORCINE) 1000 UNIT/ML DIALYSIS
1000.0000 [IU] | INTRAMUSCULAR | Status: DC | PRN
  Filled 2020-03-12: qty 1

## 2020-03-13 ENCOUNTER — Other Ambulatory Visit
Admission: RE | Admit: 2020-03-13 | Discharge: 2020-03-13 | Disposition: A | Discharge status: Home / Self Care | Payer: Medicare Other | Source: Ambulatory Visit | Attending: Vascular Surgery | Admitting: Vascular Surgery

## 2020-03-13 DIAGNOSIS — N186 End stage renal disease: Secondary | ICD-10-CM | POA: Diagnosis not present

## 2020-03-13 DIAGNOSIS — N2581 Secondary hyperparathyroidism of renal origin: Secondary | ICD-10-CM | POA: Diagnosis not present

## 2020-03-13 DIAGNOSIS — D509 Iron deficiency anemia, unspecified: Secondary | ICD-10-CM | POA: Diagnosis not present

## 2020-03-13 DIAGNOSIS — D631 Anemia in chronic kidney disease: Secondary | ICD-10-CM | POA: Diagnosis not present

## 2020-03-13 DIAGNOSIS — Z992 Dependence on renal dialysis: Secondary | ICD-10-CM | POA: Diagnosis not present

## 2020-03-14 ENCOUNTER — Other Ambulatory Visit: Payer: Self-pay

## 2020-03-14 ENCOUNTER — Other Ambulatory Visit
Admission: RE | Admit: 2020-03-14 | Discharge: 2020-03-14 | Disposition: A | Discharge status: Home / Self Care | Payer: Medicare Other | Source: Ambulatory Visit | Attending: Vascular Surgery | Admitting: Vascular Surgery

## 2020-03-14 DIAGNOSIS — N186 End stage renal disease: Secondary | ICD-10-CM | POA: Diagnosis not present

## 2020-03-14 DIAGNOSIS — Z992 Dependence on renal dialysis: Secondary | ICD-10-CM | POA: Diagnosis not present

## 2020-03-14 DIAGNOSIS — T8612 Kidney transplant failure: Secondary | ICD-10-CM | POA: Diagnosis not present

## 2020-03-14 LAB — BASIC METABOLIC PANEL
Anion gap: 18 — ABNORMAL HIGH (ref 5–15)
BUN: 24 mg/dL — ABNORMAL HIGH (ref 8–23)
CO2: 30 mmol/L (ref 22–32)
Calcium: 9.8 mg/dL (ref 8.9–10.3)
Chloride: 92 mmol/L — ABNORMAL LOW (ref 98–111)
Creatinine, Ser: 5.23 mg/dL — ABNORMAL HIGH (ref 0.44–1.00)
GFR calc Af Amer: 9 mL/min — ABNORMAL LOW (ref >60)
GFR calc non Af Amer: 8 mL/min — ABNORMAL LOW (ref >60)
Glucose, Bld: 78 mg/dL (ref 70–99)
Potassium: 4.4 mmol/L (ref 3.5–5.1)
Sodium: 140 mmol/L (ref 135–145)

## 2020-03-14 LAB — CBC WITH DIFFERENTIAL/PLATELET
Abs Immature Granulocytes: 0.02 10*3/uL (ref 0.00–0.07)
Basophils Absolute: 0 10*3/uL (ref 0.0–0.1)
Basophils Relative: 1 %
Eosinophils Absolute: 0.3 10*3/uL (ref 0.0–0.5)
Eosinophils Relative: 5 %
HCT: 37.4 % (ref 36.0–46.0)
Hemoglobin: 11.9 g/dL — ABNORMAL LOW (ref 12.0–15.0)
Immature Granulocytes: 0 %
Lymphocytes Relative: 33 %
Lymphs Abs: 1.8 10*3/uL (ref 0.7–4.0)
MCH: 30.1 pg (ref 26.0–34.0)
MCHC: 31.8 g/dL (ref 30.0–36.0)
MCV: 94.4 fL (ref 80.0–100.0)
Monocytes Absolute: 0.6 10*3/uL (ref 0.1–1.0)
Monocytes Relative: 12 %
Neutro Abs: 2.7 10*3/uL (ref 1.7–7.7)
Neutrophils Relative %: 49 %
Platelets: 247 10*3/uL (ref 150–400)
RBC: 3.96 MIL/uL (ref 3.87–5.11)
RDW: 16.6 % — ABNORMAL HIGH (ref 11.5–15.5)
WBC: 5.4 10*3/uL (ref 4.0–10.5)
nRBC: 0 % (ref 0.0–0.2)

## 2020-03-14 LAB — SARS CORONAVIRUS 2 (TAT 6-24 HRS): SARS Coronavirus 2: NEGATIVE

## 2020-03-14 LAB — APTT: aPTT: 32 seconds (ref 24–36)

## 2020-03-14 LAB — PROTIME-INR
INR: 1.1 (ref 0.8–1.2)
Prothrombin Time: 13.8 seconds (ref 11.4–15.2)

## 2020-03-15 ENCOUNTER — Ambulatory Visit: Payer: Medicare Other

## 2020-03-15 ENCOUNTER — Inpatient Hospital Stay: Payer: Medicare Other

## 2020-03-15 ENCOUNTER — Inpatient Hospital Stay
Admission: RE | Admit: 2020-03-15 | Discharge: 2020-03-18 | DRG: 264 | Disposition: A | Discharge status: Home / Self Care | Payer: Medicare Other | Attending: Internal Medicine | Admitting: Internal Medicine

## 2020-03-15 ENCOUNTER — Encounter
Admission: RE | Disposition: A | Discharge status: Home / Self Care | Payer: Self-pay | Source: Home / Self Care | Attending: Internal Medicine

## 2020-03-15 ENCOUNTER — Observation Stay (HOSPITAL_BASED_OUTPATIENT_CLINIC_OR_DEPARTMENT_OTHER)
Admission: RE | Admit: 2020-03-15 | Discharge: 2020-03-15 | Disposition: A | Discharge status: Home / Self Care | Payer: Medicare Other | Source: Home / Self Care | Attending: Pulmonary Disease | Admitting: Pulmonary Disease

## 2020-03-15 ENCOUNTER — Ambulatory Visit: Payer: Medicare Other | Admitting: Certified Registered"

## 2020-03-15 ENCOUNTER — Encounter: Payer: Self-pay | Admitting: Vascular Surgery

## 2020-03-15 DIAGNOSIS — R9431 Abnormal electrocardiogram [ECG] [EKG]: Secondary | ICD-10-CM

## 2020-03-15 DIAGNOSIS — Z978 Presence of other specified devices: Secondary | ICD-10-CM

## 2020-03-15 DIAGNOSIS — E785 Hyperlipidemia, unspecified: Secondary | ICD-10-CM | POA: Diagnosis present

## 2020-03-15 DIAGNOSIS — R0789 Other chest pain: Secondary | ICD-10-CM | POA: Diagnosis not present

## 2020-03-15 DIAGNOSIS — Z86010 Personal history of colonic polyps: Secondary | ICD-10-CM

## 2020-03-15 DIAGNOSIS — K219 Gastro-esophageal reflux disease without esophagitis: Secondary | ICD-10-CM | POA: Diagnosis present

## 2020-03-15 DIAGNOSIS — I2510 Atherosclerotic heart disease of native coronary artery without angina pectoris: Secondary | ICD-10-CM | POA: Diagnosis present

## 2020-03-15 DIAGNOSIS — R4189 Other symptoms and signs involving cognitive functions and awareness: Secondary | ICD-10-CM

## 2020-03-15 DIAGNOSIS — G9341 Metabolic encephalopathy: Secondary | ICD-10-CM | POA: Diagnosis present

## 2020-03-15 DIAGNOSIS — Z83438 Family history of other disorder of lipoprotein metabolism and other lipidemia: Secondary | ICD-10-CM

## 2020-03-15 DIAGNOSIS — I272 Pulmonary hypertension, unspecified: Secondary | ICD-10-CM | POA: Diagnosis present

## 2020-03-15 DIAGNOSIS — I426 Alcoholic cardiomyopathy: Secondary | ICD-10-CM | POA: Diagnosis not present

## 2020-03-15 DIAGNOSIS — I471 Supraventricular tachycardia: Secondary | ICD-10-CM | POA: Diagnosis present

## 2020-03-15 DIAGNOSIS — I973 Postprocedural hypertension: Secondary | ICD-10-CM | POA: Diagnosis present

## 2020-03-15 DIAGNOSIS — F418 Other specified anxiety disorders: Secondary | ICD-10-CM | POA: Diagnosis present

## 2020-03-15 DIAGNOSIS — R54 Age-related physical debility: Secondary | ICD-10-CM | POA: Diagnosis present

## 2020-03-15 DIAGNOSIS — I361 Nonrheumatic tricuspid (valve) insufficiency: Secondary | ICD-10-CM

## 2020-03-15 DIAGNOSIS — I120 Hypertensive chronic kidney disease with stage 5 chronic kidney disease or end stage renal disease: Secondary | ICD-10-CM | POA: Diagnosis not present

## 2020-03-15 DIAGNOSIS — J811 Chronic pulmonary edema: Secondary | ICD-10-CM | POA: Diagnosis not present

## 2020-03-15 DIAGNOSIS — I420 Dilated cardiomyopathy: Secondary | ICD-10-CM

## 2020-03-15 DIAGNOSIS — Z8249 Family history of ischemic heart disease and other diseases of the circulatory system: Secondary | ICD-10-CM

## 2020-03-15 DIAGNOSIS — N189 Chronic kidney disease, unspecified: Secondary | ICD-10-CM | POA: Diagnosis present

## 2020-03-15 DIAGNOSIS — R0602 Shortness of breath: Secondary | ICD-10-CM | POA: Diagnosis not present

## 2020-03-15 DIAGNOSIS — J810 Acute pulmonary edema: Secondary | ICD-10-CM | POA: Diagnosis not present

## 2020-03-15 DIAGNOSIS — R404 Transient alteration of awareness: Secondary | ICD-10-CM

## 2020-03-15 DIAGNOSIS — Z419 Encounter for procedure for purposes other than remedying health state, unspecified: Secondary | ICD-10-CM | POA: Diagnosis not present

## 2020-03-15 DIAGNOSIS — M2669 Other specified disorders of temporomandibular joint: Secondary | ICD-10-CM | POA: Diagnosis present

## 2020-03-15 DIAGNOSIS — Z992 Dependence on renal dialysis: Secondary | ICD-10-CM | POA: Diagnosis not present

## 2020-03-15 DIAGNOSIS — I9581 Postprocedural hypotension: Secondary | ICD-10-CM | POA: Diagnosis present

## 2020-03-15 DIAGNOSIS — Z8673 Personal history of transient ischemic attack (TIA), and cerebral infarction without residual deficits: Secondary | ICD-10-CM

## 2020-03-15 DIAGNOSIS — I5032 Chronic diastolic (congestive) heart failure: Secondary | ICD-10-CM | POA: Diagnosis present

## 2020-03-15 DIAGNOSIS — I472 Ventricular tachycardia, unspecified: Secondary | ICD-10-CM

## 2020-03-15 DIAGNOSIS — Z981 Arthrodesis status: Secondary | ICD-10-CM

## 2020-03-15 DIAGNOSIS — I071 Rheumatic tricuspid insufficiency: Secondary | ICD-10-CM | POA: Diagnosis present

## 2020-03-15 DIAGNOSIS — I4510 Unspecified right bundle-branch block: Secondary | ICD-10-CM | POA: Diagnosis present

## 2020-03-15 DIAGNOSIS — E875 Hyperkalemia: Secondary | ICD-10-CM | POA: Diagnosis present

## 2020-03-15 DIAGNOSIS — I132 Hypertensive heart and chronic kidney disease with heart failure and with stage 5 chronic kidney disease, or end stage renal disease: Secondary | ICD-10-CM | POA: Diagnosis present

## 2020-03-15 DIAGNOSIS — I959 Hypotension, unspecified: Secondary | ICD-10-CM | POA: Diagnosis not present

## 2020-03-15 DIAGNOSIS — D631 Anemia in chronic kidney disease: Secondary | ICD-10-CM | POA: Diagnosis present

## 2020-03-15 DIAGNOSIS — R001 Bradycardia, unspecified: Secondary | ICD-10-CM | POA: Diagnosis present

## 2020-03-15 DIAGNOSIS — R569 Unspecified convulsions: Secondary | ICD-10-CM | POA: Diagnosis present

## 2020-03-15 DIAGNOSIS — T82898A Other specified complication of vascular prosthetic devices, implants and grafts, initial encounter: Secondary | ICD-10-CM | POA: Diagnosis not present

## 2020-03-15 DIAGNOSIS — N2581 Secondary hyperparathyroidism of renal origin: Secondary | ICD-10-CM | POA: Diagnosis present

## 2020-03-15 DIAGNOSIS — R079 Chest pain, unspecified: Secondary | ICD-10-CM | POA: Diagnosis not present

## 2020-03-15 DIAGNOSIS — I5043 Acute on chronic combined systolic (congestive) and diastolic (congestive) heart failure: Secondary | ICD-10-CM | POA: Diagnosis not present

## 2020-03-15 DIAGNOSIS — Z20822 Contact with and (suspected) exposure to covid-19: Secondary | ICD-10-CM | POA: Diagnosis present

## 2020-03-15 DIAGNOSIS — Z87891 Personal history of nicotine dependence: Secondary | ICD-10-CM

## 2020-03-15 DIAGNOSIS — T829XXA Unspecified complication of cardiac and vascular prosthetic device, implant and graft, initial encounter: Secondary | ICD-10-CM | POA: Diagnosis present

## 2020-03-15 DIAGNOSIS — N059 Unspecified nephritic syndrome with unspecified morphologic changes: Secondary | ICD-10-CM | POA: Diagnosis present

## 2020-03-15 DIAGNOSIS — Z79899 Other long term (current) drug therapy: Secondary | ICD-10-CM

## 2020-03-15 DIAGNOSIS — T8619 Other complication of kidney transplant: Secondary | ICD-10-CM | POA: Diagnosis present

## 2020-03-15 DIAGNOSIS — Z823 Family history of stroke: Secondary | ICD-10-CM

## 2020-03-15 DIAGNOSIS — I5033 Acute on chronic diastolic (congestive) heart failure: Secondary | ICD-10-CM

## 2020-03-15 DIAGNOSIS — M810 Age-related osteoporosis without current pathological fracture: Secondary | ICD-10-CM | POA: Diagnosis present

## 2020-03-15 DIAGNOSIS — M264 Malocclusion, unspecified: Secondary | ICD-10-CM | POA: Diagnosis present

## 2020-03-15 DIAGNOSIS — Z888 Allergy status to other drugs, medicaments and biological substances status: Secondary | ICD-10-CM

## 2020-03-15 DIAGNOSIS — Z800 Family history of malignant neoplasm of digestive organs: Secondary | ICD-10-CM

## 2020-03-15 DIAGNOSIS — N186 End stage renal disease: Secondary | ICD-10-CM | POA: Diagnosis present

## 2020-03-15 DIAGNOSIS — R4182 Altered mental status, unspecified: Secondary | ICD-10-CM | POA: Insufficient documentation

## 2020-03-15 HISTORY — PX: AV FISTULA PLACEMENT: SHX1204

## 2020-03-15 LAB — CBC
HCT: 34.7 % — ABNORMAL LOW (ref 36.0–46.0)
HCT: 34.9 % — ABNORMAL LOW (ref 36.0–46.0)
Hemoglobin: 10.7 g/dL — ABNORMAL LOW (ref 12.0–15.0)
Hemoglobin: 11.2 g/dL — ABNORMAL LOW (ref 12.0–15.0)
MCH: 30.1 pg (ref 26.0–34.0)
MCH: 30.5 pg (ref 26.0–34.0)
MCHC: 30.8 g/dL (ref 30.0–36.0)
MCHC: 32.1 g/dL (ref 30.0–36.0)
MCV: 97.7 fL (ref 80.0–100.0)
MCV: 95.1 fL (ref 80.0–100.0)
Platelets: 266 10*3/uL (ref 150–400)
Platelets: 219 10*3/uL (ref 150–400)
RBC: 3.55 MIL/uL — ABNORMAL LOW (ref 3.87–5.11)
RBC: 3.67 MIL/uL — ABNORMAL LOW (ref 3.87–5.11)
RDW: 16.8 % — ABNORMAL HIGH (ref 11.5–15.5)
RDW: 17 % — ABNORMAL HIGH (ref 11.5–15.5)
WBC: 8.1 10*3/uL (ref 4.0–10.5)
WBC: 20 10*3/uL — ABNORMAL HIGH (ref 4.0–10.5)
nRBC: 0 % (ref 0.0–0.2)
nRBC: 0 % (ref 0.0–0.2)

## 2020-03-15 LAB — BASIC METABOLIC PANEL
Anion gap: 20 — ABNORMAL HIGH (ref 5–15)
BUN: 46 mg/dL — ABNORMAL HIGH (ref 8–23)
CO2: 22 mmol/L (ref 22–32)
Calcium: 9.4 mg/dL (ref 8.9–10.3)
Chloride: 98 mmol/L (ref 98–111)
Creatinine, Ser: 7.81 mg/dL — ABNORMAL HIGH (ref 0.44–1.00)
GFR calc Af Amer: 6 mL/min — ABNORMAL LOW (ref >60)
GFR calc non Af Amer: 5 mL/min — ABNORMAL LOW (ref >60)
Glucose, Bld: 144 mg/dL — ABNORMAL HIGH (ref 70–99)
Potassium: 6.1 mmol/L — ABNORMAL HIGH (ref 3.5–5.1)
Sodium: 140 mmol/L (ref 135–145)

## 2020-03-15 LAB — POCT I-STAT, CHEM 8
BUN: 37 mg/dL — ABNORMAL HIGH (ref 8–23)
Calcium, Ion: 1.01 mmol/L — ABNORMAL LOW (ref 1.15–1.40)
Chloride: 99 mmol/L (ref 98–111)
Creatinine, Ser: 7.9 mg/dL — ABNORMAL HIGH (ref 0.44–1.00)
Glucose, Bld: 81 mg/dL (ref 70–99)
HCT: 34 % — ABNORMAL LOW (ref 36.0–46.0)
Hemoglobin: 11.6 g/dL — ABNORMAL LOW (ref 12.0–15.0)
Potassium: 4.6 mmol/L (ref 3.5–5.1)
Sodium: 136 mmol/L (ref 135–145)
TCO2: 29 mmol/L (ref 22–32)

## 2020-03-15 LAB — CORTISOL: Cortisol, Plasma: 21.5 ug/dL

## 2020-03-15 LAB — BLOOD GAS, VENOUS
Acid-Base Excess: 0.1 mmol/L (ref 0.0–2.0)
Bicarbonate: 27.6 mmol/L (ref 20.0–28.0)
O2 Saturation: 12 %
Patient temperature: 37
pCO2, Ven: 56 mmHg (ref 44.0–60.0)
pH, Ven: 7.3 (ref 7.250–7.430)

## 2020-03-15 LAB — MRSA PCR SCREENING: MRSA by PCR: NEGATIVE

## 2020-03-15 LAB — GLUCOSE, CAPILLARY
Glucose-Capillary: 125 mg/dL — ABNORMAL HIGH (ref 70–99)
Glucose-Capillary: 69 mg/dL — ABNORMAL LOW (ref 70–99)
Glucose-Capillary: 85 mg/dL (ref 70–99)

## 2020-03-15 LAB — ECHOCARDIOGRAM LIMITED: Weight: 1792 oz

## 2020-03-15 LAB — TROPONIN I (HIGH SENSITIVITY): Troponin I (High Sensitivity): 25 ng/L — ABNORMAL HIGH (ref <18)

## 2020-03-15 LAB — ABO/RH: ABO/RH(D): O POS

## 2020-03-15 LAB — TSH: TSH: 2.793 u[IU]/mL (ref 0.350–4.500)

## 2020-03-15 SURGERY — INSERTION OF ARTERIOVENOUS (AV) GORE-TEX GRAFT ARM
Anesthesia: General | Laterality: Right

## 2020-03-15 MED ORDER — BUPIVACAINE LIPOSOME 1.3 % IJ SUSP
INTRAMUSCULAR | Status: AC
  Filled 2020-03-15: qty 20

## 2020-03-15 MED ORDER — FERRIC CITRATE 1 GM 210 MG(FE) PO TABS
420.0000 mg | ORAL_TABLET | Freq: Three times a day (TID) | ORAL | Status: DC
  Administered 2020-03-16 – 2020-03-18 (×7): 420 mg via ORAL
  Filled 2020-03-15 (×11): qty 2

## 2020-03-15 MED ORDER — CINACALCET HCL 30 MG PO TABS
30.0000 mg | ORAL_TABLET | Freq: Every day | ORAL | Status: DC
  Administered 2020-03-16 – 2020-03-18 (×3): 30 mg via ORAL
  Filled 2020-03-15 (×4): qty 1

## 2020-03-15 MED ORDER — CEFAZOLIN SODIUM-DEXTROSE 1-4 GM/50ML-% IV SOLN
INTRAVENOUS | Status: AC
  Filled 2020-03-15: qty 50

## 2020-03-15 MED ORDER — LORAZEPAM 2 MG/ML IJ SOLN
INTRAMUSCULAR | Status: AC
  Filled 2020-03-15: qty 1

## 2020-03-15 MED ORDER — PROPOFOL 10 MG/ML IV BOLUS
INTRAVENOUS | Status: AC
  Filled 2020-03-15: qty 40

## 2020-03-15 MED ORDER — ACETAMINOPHEN 325 MG PO TABS: 650.0000 mg | ORAL_TABLET | Freq: Four times a day (QID) | ORAL | Status: DC | PRN

## 2020-03-15 MED ORDER — SODIUM CHLORIDE 0.9 % IV SOLN: INTRAVENOUS | Status: DC

## 2020-03-15 MED ORDER — CHLORHEXIDINE GLUCONATE CLOTH 2 % EX PADS: 6.0000 | MEDICATED_PAD | Freq: Once | CUTANEOUS | Status: DC

## 2020-03-15 MED ORDER — PROPOFOL 10 MG/ML IV BOLUS
INTRAVENOUS | Status: DC | PRN
  Administered 2020-03-15: 80 mg via INTRAVENOUS
  Administered 2020-03-15: 30 mg via INTRAVENOUS

## 2020-03-15 MED ORDER — NITROGLYCERIN 2 % TD OINT
TOPICAL_OINTMENT | TRANSDERMAL | Status: AC
  Filled 2020-03-15: qty 1

## 2020-03-15 MED ORDER — DEXTROSE 50 % IV SOLN
INTRAVENOUS | Status: AC
  Filled 2020-03-15: qty 50

## 2020-03-15 MED ORDER — CALCIUM GLUCONATE-NACL 1-0.675 GM/50ML-% IV SOLN
1.0000 g | Freq: Once | INTRAVENOUS | Status: AC
  Administered 2020-03-15: 23:00:00 1000 mg via INTRAVENOUS
  Filled 2020-03-15: qty 50

## 2020-03-15 MED ORDER — BUPIVACAINE LIPOSOME 1.3 % IJ SUSP
INTRAMUSCULAR | Status: DC | PRN
  Administered 2020-03-15: 10 mL

## 2020-03-15 MED ORDER — DEXTROSE-NACL 5-0.45 % IV SOLN: INTRAVENOUS | Status: DC

## 2020-03-15 MED ORDER — CALCIUM GLUCONATE 10 % IV SOLN: 1.0000 g | Freq: Once | INTRAVENOUS | Status: DC

## 2020-03-15 MED ORDER — FENTANYL CITRATE (PF) 100 MCG/2ML IJ SOLN
INTRAMUSCULAR | Status: AC
  Administered 2020-03-15: 50 ug via INTRAVENOUS
  Filled 2020-03-15: qty 2

## 2020-03-15 MED ORDER — HYDROCODONE-ACETAMINOPHEN 5-325 MG PO TABS: 1.0000 | ORAL_TABLET | ORAL | Status: DC | PRN

## 2020-03-15 MED ORDER — EPHEDRINE SULFATE 50 MG/ML IJ SOLN
INTRAMUSCULAR | Status: DC | PRN
  Administered 2020-03-15: 10 mg via INTRAVENOUS

## 2020-03-15 MED ORDER — FENTANYL CITRATE (PF) 100 MCG/2ML IJ SOLN: 50.0000 ug | INTRAMUSCULAR | Status: DC | PRN

## 2020-03-15 MED ORDER — SODIUM CHLORIDE 0.9 % IV BOLUS
200.0000 mL | Freq: Once | INTRAVENOUS | Status: AC
  Administered 2020-03-15: 12:00:00 200 mL via INTRAVENOUS

## 2020-03-15 MED ORDER — HEPARIN SODIUM (PORCINE) 5000 UNIT/ML IJ SOLN
INTRAMUSCULAR | Status: AC
  Filled 2020-03-15: qty 1

## 2020-03-15 MED ORDER — FAMOTIDINE 20 MG PO TABS
ORAL_TABLET | ORAL | Status: AC
  Administered 2020-03-15: 06:00:00 20 mg via ORAL
  Filled 2020-03-15: qty 1

## 2020-03-15 MED ORDER — CEFAZOLIN SODIUM-DEXTROSE 1-4 GM/50ML-% IV SOLN
1.0000 g | INTRAVENOUS | Status: AC
  Administered 2020-03-15: 2 g via INTRAVENOUS

## 2020-03-15 MED ORDER — HEPARIN SODIUM (PORCINE) 5000 UNIT/ML IJ SOLN
5000.0000 [IU] | Freq: Three times a day (TID) | INTRAMUSCULAR | Status: DC
  Administered 2020-03-16 – 2020-03-18 (×8): 5000 [IU] via SUBCUTANEOUS
  Filled 2020-03-15 (×8): qty 1

## 2020-03-15 MED ORDER — SODIUM CHLORIDE 0.9 % IV SOLN
INTRAVENOUS | Status: DC | PRN
  Administered 2020-03-15: 25 ug/min via INTRAVENOUS

## 2020-03-15 MED ORDER — MIDAZOLAM HCL 2 MG/2ML IJ SOLN: 1.0000 mg | INTRAMUSCULAR | Status: DC | PRN

## 2020-03-15 MED ORDER — LACTULOSE 10 GM/15ML PO SOLN
20.0000 g | Freq: Every day | ORAL | Status: DC | PRN
  Filled 2020-03-15: qty 30

## 2020-03-15 MED ORDER — FENTANYL CITRATE (PF) 100 MCG/2ML IJ SOLN: 25.0000 ug | INTRAMUSCULAR | Status: DC | PRN

## 2020-03-15 MED ORDER — ONDANSETRON HCL 4 MG PO TABS
8.0000 mg | ORAL_TABLET | Freq: Two times a day (BID) | ORAL | Status: DC | PRN
  Administered 2020-03-15: 20:00:00 8 mg via ORAL
  Filled 2020-03-15: qty 2

## 2020-03-15 MED ORDER — DEXTROSE 50 % IV SOLN: 50.0000 mL | Freq: Once | INTRAVENOUS | Status: DC

## 2020-03-15 MED ORDER — INSULIN ASPART 100 UNIT/ML IV SOLN
10.0000 [IU] | Freq: Once | INTRAVENOUS | Status: DC
  Filled 2020-03-15: qty 0.1

## 2020-03-15 MED ORDER — MIDAZOLAM HCL 2 MG/2ML IJ SOLN
INTRAMUSCULAR | Status: AC
  Filled 2020-03-15: qty 2

## 2020-03-15 MED ORDER — FENTANYL CITRATE (PF) 100 MCG/2ML IJ SOLN: 50.0000 ug | Freq: Once | INTRAMUSCULAR | Status: AC

## 2020-03-15 MED ORDER — LIDOCAINE HCL (CARDIAC) PF 100 MG/5ML IV SOSY
PREFILLED_SYRINGE | INTRAVENOUS | Status: DC | PRN
  Administered 2020-03-15: 60 mg via INTRAVENOUS

## 2020-03-15 MED ORDER — ACETAMINOPHEN 650 MG RE SUPP
650.0000 mg | Freq: Four times a day (QID) | RECTAL | Status: DC | PRN
  Filled 2020-03-15: qty 1

## 2020-03-15 MED ORDER — PHENYLEPHRINE HCL (PRESSORS) 10 MG/ML IV SOLN
INTRAVENOUS | Status: DC | PRN
  Administered 2020-03-15: 200 ug via INTRAVENOUS
  Administered 2020-03-15: 100 ug via INTRAVENOUS
  Administered 2020-03-15 (×2): 200 ug via INTRAVENOUS

## 2020-03-15 MED ORDER — HYDROCODONE-ACETAMINOPHEN 5-325 MG PO TABS: 1.0000 | ORAL_TABLET | Freq: Four times a day (QID) | ORAL | 0 refills | Status: DC | PRN

## 2020-03-15 MED ORDER — NITROGLYCERIN 2 % TD OINT
1.0000 [in_us] | TOPICAL_OINTMENT | Freq: Once | TRANSDERMAL | Status: AC
  Administered 2020-03-15: 15:00:00 1 [in_us] via TOPICAL

## 2020-03-15 MED ORDER — ALLOPURINOL 300 MG PO TABS
150.0000 mg | ORAL_TABLET | Freq: Every day | ORAL | Status: DC
  Administered 2020-03-16 – 2020-03-17 (×2): 150 mg via ORAL
  Filled 2020-03-15 (×4): qty 0.5

## 2020-03-15 MED ORDER — POLYETHYLENE GLYCOL 3350 17 G PO PACK
17.0000 g | PACK | Freq: Every day | ORAL | Status: DC | PRN
  Filled 2020-03-15: qty 1

## 2020-03-15 MED ORDER — ONDANSETRON HCL 4 MG/2ML IJ SOLN
INTRAMUSCULAR | Status: DC | PRN
  Administered 2020-03-15: 4 mg via INTRAVENOUS

## 2020-03-15 MED ORDER — ALUM & MAG HYDROXIDE-SIMETH 200-200-20 MG/5ML PO SUSP: 30.0000 mL | Freq: Once | ORAL | Status: DC

## 2020-03-15 MED ORDER — GLYCOPYRROLATE 0.2 MG/ML IJ SOLN
INTRAMUSCULAR | Status: DC | PRN
  Administered 2020-03-15: .2 mg via INTRAVENOUS

## 2020-03-15 MED ORDER — FAMOTIDINE 20 MG PO TABS: 20.0000 mg | ORAL_TABLET | Freq: Once | ORAL | Status: AC

## 2020-03-15 MED ORDER — BUPIVACAINE HCL (PF) 0.5 % IJ SOLN
INTRAMUSCULAR | Status: DC | PRN
  Administered 2020-03-15: 10 mL

## 2020-03-15 MED ORDER — PROPOFOL 10 MG/ML IV BOLUS
INTRAVENOUS | Status: AC
  Filled 2020-03-15: qty 20

## 2020-03-15 MED ORDER — DEXTROSE 50 % IV SOLN: 1.0000 | Freq: Once | INTRAVENOUS | Status: DC

## 2020-03-15 MED ORDER — SODIUM CHLORIDE 0.9 % IV SOLN
0.0000 ug/min | INTRAVENOUS | Status: DC
  Administered 2020-03-15 (×3): 60 ug/min via INTRAVENOUS
  Administered 2020-03-15: 11:00:00 20 ug/min via INTRAVENOUS
  Administered 2020-03-15: 60 ug/min via INTRAVENOUS
  Administered 2020-03-15: 11:00:00 40 ug/min via INTRAVENOUS
  Filled 2020-03-15 (×4): qty 10

## 2020-03-15 MED ORDER — DEXAMETHASONE SODIUM PHOSPHATE 10 MG/ML IJ SOLN
INTRAMUSCULAR | Status: DC | PRN
  Administered 2020-03-15: 10 mg via INTRAVENOUS

## 2020-03-15 MED ORDER — ROPIVACAINE HCL 5 MG/ML IJ SOLN
INTRAMUSCULAR | Status: AC
  Filled 2020-03-15: qty 30

## 2020-03-15 MED ORDER — DIALYVITE 800 0.8 MG PO WAFR: 1.0000 | WAFER | Freq: Every day | ORAL | Status: DC

## 2020-03-15 MED ORDER — LIDOCAINE HCL (PF) 1 % IJ SOLN
INTRAMUSCULAR | Status: AC
  Filled 2020-03-15: qty 5

## 2020-03-15 MED ORDER — FENTANYL CITRATE (PF) 100 MCG/2ML IJ SOLN
INTRAMUSCULAR | Status: AC
  Filled 2020-03-15: qty 2

## 2020-03-15 MED ORDER — INSULIN ASPART 100 UNIT/ML IV SOLN
5.0000 [IU] | Freq: Once | INTRAVENOUS | Status: DC
  Filled 2020-03-15: qty 0.05

## 2020-03-15 MED ORDER — VASOPRESSIN 20 UNIT/ML IV SOLN
INTRAVENOUS | Status: DC | PRN
  Administered 2020-03-15 (×5): 2 [IU] via INTRAVENOUS

## 2020-03-15 MED ORDER — SODIUM CHLORIDE (PF) 0.9 % IJ SOLN
500.0000 mL | Freq: Once | INTRAMUSCULAR | Status: AC
  Administered 2020-03-15: 14:00:00 500 mL via INTRAVENOUS
  Filled 2020-03-15: qty 500

## 2020-03-15 MED ORDER — DOCUSATE SODIUM 100 MG PO CAPS
100.0000 mg | ORAL_CAPSULE | Freq: Two times a day (BID) | ORAL | Status: DC
  Administered 2020-03-16 – 2020-03-17 (×2): 100 mg via ORAL
  Filled 2020-03-15 (×3): qty 1

## 2020-03-15 MED ORDER — BUPIVACAINE HCL (PF) 0.5 % IJ SOLN
INTRAMUSCULAR | Status: AC
  Filled 2020-03-15: qty 30

## 2020-03-15 MED ORDER — CHLORHEXIDINE GLUCONATE CLOTH 2 % EX PADS: 6.0000 | MEDICATED_PAD | Freq: Every day | CUTANEOUS | Status: DC

## 2020-03-15 MED ORDER — SIMVASTATIN 20 MG PO TABS
20.0000 mg | ORAL_TABLET | Freq: Every day | ORAL | Status: DC
  Administered 2020-03-16 – 2020-03-17 (×2): 20 mg via ORAL
  Filled 2020-03-15 (×3): qty 1

## 2020-03-15 SURGICAL SUPPLY — 66 items
ADH SKN CLS APL DERMABOND .7 (GAUZE/BANDAGES/DRESSINGS) ×1
APL PRP STRL LF DISP 70% ISPRP (MISCELLANEOUS) ×1
APPLIER CLIP 11 MED OPEN (CLIP)
APPLIER CLIP 9.375 SM OPEN (CLIP)
APR CLP MED 11 20 MLT OPN (CLIP)
APR CLP SM 9.3 20 MLT OPN (CLIP)
BAG COUNTER SPONGE EZ (MISCELLANEOUS) ×2 IMPLANT
BAG DECANTER FOR FLEXI CONT (MISCELLANEOUS) ×2 IMPLANT
BAG SPNG 4X4 CLR HAZ (MISCELLANEOUS) ×1
BLADE SURG SZ11 CARB STEEL (BLADE) ×2 IMPLANT
BOOT SUTURE AID YELLOW STND (SUTURE) ×2 IMPLANT
BRUSH SCRUB EZ  4% CHG (MISCELLANEOUS) ×2
BRUSH SCRUB EZ 4% CHG (MISCELLANEOUS) ×1 IMPLANT
CANISTER SUCT 1200ML W/VALVE (MISCELLANEOUS) ×2 IMPLANT
CHLORAPREP W/TINT 26 (MISCELLANEOUS) ×2 IMPLANT
CLIP APPLIE 11 MED OPEN (CLIP) IMPLANT
CLIP APPLIE 9.375 SM OPEN (CLIP) IMPLANT
COVER WAND RF STERILE (DRAPES) ×2 IMPLANT
DERMABOND ADVANCED (GAUZE/BANDAGES/DRESSINGS) ×1
DERMABOND ADVANCED .7 DNX12 (GAUZE/BANDAGES/DRESSINGS) ×1 IMPLANT
DRESSING SURGICEL FIBRLLR 1X2 (HEMOSTASIS) ×1 IMPLANT
DRSG SURGICEL FIBRILLAR 1X2 (HEMOSTASIS) ×2
ELECT CAUTERY BLADE 6.4 (BLADE) ×2 IMPLANT
ELECT REM PT RETURN 9FT ADLT (ELECTROSURGICAL) ×2
ELECTRODE REM PT RTRN 9FT ADLT (ELECTROSURGICAL) ×1 IMPLANT
GLOVE BIO SURGEON STRL SZ7 (GLOVE) ×2 IMPLANT
GLOVE INDICATOR 7.5 STRL GRN (GLOVE) ×2 IMPLANT
GLOVE SURG SYN 8.0 (GLOVE) ×2 IMPLANT
GLOVE SURG SYN 8.0 PF PI (GLOVE) ×1 IMPLANT
GOWN STRL REUS W/ TWL LRG LVL3 (GOWN DISPOSABLE) ×2 IMPLANT
GOWN STRL REUS W/ TWL XL LVL3 (GOWN DISPOSABLE) ×1 IMPLANT
GOWN STRL REUS W/TWL LRG LVL3 (GOWN DISPOSABLE) ×4
GOWN STRL REUS W/TWL XL LVL3 (GOWN DISPOSABLE) ×2
GRAFT PROPATEN STD WALL 4 7X45 (Vascular Products) ×2 IMPLANT
IV NS 500ML (IV SOLUTION) ×2
IV NS 500ML BAXH (IV SOLUTION) ×1 IMPLANT
KIT TURNOVER KIT A (KITS) ×2 IMPLANT
LABEL OR SOLS (LABEL) ×2 IMPLANT
LOOP RED MAXI  1X406MM (MISCELLANEOUS) ×1
LOOP VESSEL MAXI  1X406 RED (MISCELLANEOUS) ×1
LOOP VESSEL MAXI 1X406 RED (MISCELLANEOUS) ×1 IMPLANT
LOOP VESSEL MINI 0.8X406 BLUE (MISCELLANEOUS) ×2 IMPLANT
LOOPS BLUE MINI 0.8X406MM (MISCELLANEOUS) ×2
NDL FILTER BLUNT 18X1 1/2 (NEEDLE) ×1 IMPLANT
NEEDLE FILTER BLUNT 18X 1/2SAF (NEEDLE) ×1
NEEDLE FILTER BLUNT 18X1 1/2 (NEEDLE) ×1 IMPLANT
NS IRRIG 500ML POUR BTL (IV SOLUTION) ×2 IMPLANT
PACK EXTREMITY ARMC (MISCELLANEOUS) ×2 IMPLANT
PAD PREP 24X41 OB/GYN DISP (PERSONAL CARE ITEMS) ×2 IMPLANT
STOCKINETTE 48X4 2 PLY STRL (GAUZE/BANDAGES/DRESSINGS) ×1 IMPLANT
STOCKINETTE STRL 4IN 9604848 (GAUZE/BANDAGES/DRESSINGS) ×2 IMPLANT
SUT GTX CV-6 30 (SUTURE) ×4 IMPLANT
SUT MNCRL+ 5-0 UNDYED PC-3 (SUTURE) ×1 IMPLANT
SUT MONOCRYL 5-0 (SUTURE) ×2
SUT PROLENE 6 0 BV (SUTURE) ×6 IMPLANT
SUT SILK 2 0 (SUTURE) ×2
SUT SILK 2 0 SH (SUTURE) ×2 IMPLANT
SUT SILK 2-0 18XBRD TIE 12 (SUTURE) ×1 IMPLANT
SUT SILK 3 0 (SUTURE) ×2
SUT SILK 3-0 18XBRD TIE 12 (SUTURE) ×1 IMPLANT
SUT SILK 4 0 (SUTURE) ×2
SUT SILK 4-0 18XBRD TIE 12 (SUTURE) ×1 IMPLANT
SUT VIC AB 3-0 SH 27 (SUTURE) ×4
SUT VIC AB 3-0 SH 27X BRD (SUTURE) ×2 IMPLANT
SYR 20ML LL LF (SYRINGE) ×2 IMPLANT
SYR 3ML LL SCALE MARK (SYRINGE) ×2 IMPLANT

## 2020-03-15 NOTE — Consult Note (Signed)
Cardiology Consultation:   Patient ID: [name]; [id number]; 04/06/1951   Admit date: 03/15/2020 Date of Consult: 03/15/2020  Primary Care Provider: [name], MD Primary Cardiologist: Ag[place]Etang   Patient Profile:   [name] is a 69 y.o. female with a hx of ESRD status post renal transplant from 1996-2012 on HD since, pulmonary hypertension, TIA, HTN, HLD who is being seen today for the evaluation of chest pain at the request of Dr. Patsey Berthold.  History of Present Illness:   Ms. Top is raising admitted to [facility] in 12/2019 with acute pulmonary edema with symptoms improving following dialysis.  Echo during that admission showed an EF of 60 to 65%, mildly increased LVH, grade 2 diastolic dysfunction, no regional wall motion abnormalities, normal RV systolic function and cavity size, mild biatrial enlargement, mild mitral regurgitation, mild aortic valve sclerosis without stenosis, PASP 56.6 mmHg.  She was evaluated by Dr. [name], for preoperative cardiac evaluation prior to AV fistula procedure.  She had recently been taken off amlodipine secondary to soft BP.  She did note occasional shortness of breath with prolonged ambulation.  She underwent Lexiscan MPI on 02/20/2020 which showed no evidence of ischemia with an EF of 69% and was overall a low risk study.  On 03/15/2020, she underwent right brachial axillary AV graft placement.  Upon arrival to the PACU the patient required Neo-Synephrine to help support her BP with systolics dropping into the 70s.  She subsequently noted she felt restless and that her "chest felt funny" described as "like I have a hiccup."  Initial PACU EKG at 11:12 showed NSR, 71 bpm, incomplete RBBB with nonspecific lateral T wave changes.  Repeat EKG at 13:54 shows sinus arrhythmia, 67 bpm, incomplete RBBB, ST-T changes consistent with possible lead placement.  Initial high-sensitivity troponin 25.  Potassium 6.1 without documentation of  hemolysis, BUN 46, serum creatinine 7.1, Hgb 10.7.  BPs have been obtained in the right lower extremity secondary to dialysis access in the bilateral upper extremities.  She continues to note some mild chest discomfort.  Repeat EKG at 14:51 showed NSR, 63 bpm, incomplete RBBB, and improved ST-T changes outside of continued TWI in V2 with peaked T waves.  Nitropaste has been applied with stable BP in the 1 teens systolic.   Past Medical History:  Diagnosis Date  . Adenomatous polyp of colon 10/2010, 2006, 2015  . Anemia in CKD (chronic kidney disease) 11/07/2012   s/p blood transfusion.   . Arthritis   . CAD (coronary artery disease)    "something like that"  . CHF (congestive heart failure) ([place])   . Constipation   . Depression with anxiety   . Diverticula, colon   . 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  Had some early rejection then stable function for years, then had slow decline of function and went back on hemodialysis in 2012.  Gets HD TTS schedule at [facility] still using L forearm AVF.     Marland Kitchen GERD (gastroesophageal reflux disease)   . GI bleed 2017   felt to be ischemic colitis, last colo 2015  . Headache   . Hyperlipidemia   . Hypertension   . Neurologic gait dysfunction   . Neuromuscular disorder (HCC)    neuropathy hand and legs  . Osteoporosis   . Pneumonia   . Pseudoaneurysm of surgical AV fistula ([place])  left upper arm  . Pulmonary edema 12/2019  . Stroke [facility]) 11/2015   TIA  . Weight loss, unintentional     Past Surgical History:  Procedure Laterality Date  . AV FISTULA PLACEMENT     for dialysis  . AV FISTULA PLACEMENT Left 11/22/2015   Procedure: ARTERIOVENOUS (AV) FISTULA CREATION-LEFT BRACHIOCEPHALIC;  Surgeon: Serafina Mitchell, MD;  Location: [place];  Service: Vascular;  Laterality: Left;  . BACK SURGERY    . CERVICAL FUSION    . CHOLECYSTECTOMY   12/02/2012   Procedure: LAPAROSCOPIC CHOLECYSTECTOMY WITH INTRAOPERATIVE CHOLANGIOGRAM;  Surgeon: Edward Jolly, MD;  Location: [place];  Service: General;  Laterality: N/A;  . EYE SURGERY Bilateral    cataract surgery  . EYE SURGERY Left 2019   laser  . HEMATOMA EVACUATION Left 12/24/2016   Procedure: EVACUATION HEMATOMA LEFT UPPER ARM;  Surgeon: Waynetta Sandy, MD;  Location: [place];  Service: Vascular;  Laterality: Left;  . I & D EXTREMITY Left 12/31/2016   Procedure: IRRIGATION AND DEBRIDEMENT EXTREMITY;  Surgeon: Angelia Mould, MD;  Location: [place];  Service: Vascular;  Laterality: Left;  . INSERTION OF DIALYSIS CATHETER Right 12/24/2016   Procedure: INSERTION OF DIALYSIS CATHETER;  Surgeon: Waynetta Sandy, MD;  Location: [place];  Service: Vascular;  Laterality: Right;  . INSERTION OF DIALYSIS CATHETER Right 02/04/2017   Procedure: INSERTION OF DIALYSIS CATHETER;  Surgeon: Waynetta Sandy, MD;  Location: [place];  Service: Vascular;  Laterality: Right;  . KIDNEY TRANSPLANT  1996  . PERIPHERAL VASCULAR CATHETERIZATION Left 10/23/2016   Procedure: Fistulagram;  Surgeon: Elam Dutch, MD;  Location: [place] CV LAB;  Service: Cardiovascular;  Laterality: Left;  . RESECTION OF ARTERIOVENOUS FISTULA ANEURYSM Left 11/22/2015   Procedure: RESECTION OF LEFT RADIOCEPHALIC FISTULA ANEURYSM ;  Surgeon: Serafina Mitchell, MD;  Location: [place];  Service: Vascular;  Laterality: Left;  . REVISON OF ARTERIOVENOUS FISTULA Left 12/22/2016   Procedure: REVISON OF LEFT ARTERIOVENOUS FISTULA;  Surgeon: Waynetta Sandy, MD;  Location: [place];  Service: Vascular;  Laterality: Left;  . REVISON OF ARTERIOVENOUS FISTULA Left 02/04/2017   Procedure: REVISON OF LEFT UPPER ARM ARTERIOVENOUS FISTULA;  Surgeon: Waynetta Sandy, MD;  Location: [place];  Service: Vascular;  Laterality: Left;  . UPPER EXTREMITY ANGIOGRAPHY Bilateral 09/19/2019   Procedure: UPPER EXTREMITY  ANGIOGRAPHY;  Surgeon: Katha Cabal, MD;  Location: [place] CV LAB;  Service: Cardiovascular;  Laterality: Bilateral;     Home Meds: Prior to Admission medications   Medication Sig Start Date End Date Taking? Authorizing Provider  allopurinol (ZYLOPRIM) 300 MG tablet Take 150 mg by mouth at bedtime.   Yes [provider]  ALPRAZolam (XANAX) 0.25 MG tablet Take 0.25 mg by mouth daily as needed for anxiety.  10/18/19  Yes [provider]  AURYXIA 1 GM 210 MG(Fe) tablet Take 420 mg by mouth 3 (three) times daily with meals.  07/20/19  Yes [provider]  B Complex-C-Folic Acid (DIALYVITE 878) 0.8 MG WAFR Take 1 Wafer by mouth daily. 10/25/18  Yes [provider]  cinacalcet (SENSIPAR) 30 MG tablet Take 30 mg by mouth daily. 01/04/20  Yes [provider]  labetalol (NORMODYNE) 200 MG tablet Take 100 mg by mouth at bedtime. 01/31/20  Yes [provider]  lactulose (CHRONULAC) 10 GM/15ML solution Take 20 g by mouth daily as needed for mild constipation.   Yes [provider]  losartan (COZAAR) 50 MG  tablet Take 1 tablet (50 mg total) by mouth at bedtime. 12/14/18  Yes Mariel Aloe, MD  Methoxy PEG-Epoetin Beta (MIRCERA IJ) Mircera 01/15/20 01/13/21 Yes [provider]  ondansetron (ZOFRAN) 8 MG tablet Take 8 mg by mouth 2 (two) times daily as needed for nausea or vomiting.  01/20/18  Yes [provider]  simvastatin (ZOCOR) 20 MG tablet Take 1 tablet (20 mg total) by mouth at bedtime. 02/26/17  Yes Velvet Bathe, MD  zolpidem (AMBIEN) 10 MG tablet Take 10 mg by mouth at bedtime as needed for sleep.    Yes [provider]  acetaminophen (TYLENOL) 500 MG tablet Take 1 tablet (500 mg total) by mouth every 6 (six) hours as needed. Patient taking differently: Take 500 mg by mouth every 6 (six) hours as needed for mild pain.  09/05/18   Law, Bea Graff, PA-C  HYDROcodone-acetaminophen (NORCO) 5-325 MG tablet Take 1-2  tablets by mouth every 6 (six) hours as needed for moderate pain or severe pain. 03/15/20   Schnier, Dolores Lory, MD    Inpatient Medications: Scheduled Meds: . allopurinol  150 mg Oral QHS  . cinacalcet  30 mg Oral Daily  . Dialyvite 800  1 Wafer Oral Daily  . docusate sodium  100 mg Oral BID  . ferric citrate  420 mg Oral TID WC  . heparin  5,000 Units Subcutaneous Q8H  . lidocaine (PF)      . ropivacaine (PF) 5 mg/mL (0.5%)      . simvastatin  20 mg Oral QHS   Continuous Infusions: . sodium chloride    . phenylephrine (NEO-SYNEPHRINE) Adult infusion 60 mcg/min (03/15/20 1356)   PRN Meds: acetaminophen **OR** acetaminophen, HYDROcodone-acetaminophen, lactulose, ondansetron, polyethylene glycol  Allergies:   Allergies  Allergen Reactions  . Sulfa Antibiotics Other (See Comments)    Both parents allergic-so will not take  . Adhesive [Tape] Itching    Social History:   Social History   Socioeconomic History  . Marital status: Widowed    Spouse name: Not on file  . Number of children: 2  . Years of education: Not on file  . Highest education level: Not on file  Occupational History  . Not on file  Tobacco Use  . Smoking status: Former Smoker    Types: Cigarettes    Quit date: 12/31/1991    Years since quitting: 28.2  . Smokeless tobacco: Never Used  Substance and Sexual Activity  . Alcohol use: No    Alcohol/week: 0.0 standard drinks  . Drug use: No  . Sexual activity: Not Currently  Other Topics Concern  . Not on file  Social History Narrative   Living with her mother    Right handed   Caffeine: only decaf clears ("no brown sodas" or coffee d/t kidney disease)   Low potassium foods d/t kidney disease   Social Determinants of Health   Financial Resource Strain:   . Difficulty of Paying Living Expenses:   Food Insecurity:   . Worried About Charity fundraiser in the Last Year:   . Arboriculturist in the Last Year:   Transportation Needs:   . Lexicographer (Medical):   Marland Kitchen Lack of Transportation (Non-Medical):   Physical Activity:   . Days of Exercise per Week:   . Minutes of Exercise per Session:   Stress:   . Feeling of Stress :   Social Connections:   . Frequency of Communication with Friends and Family:   .  Frequency of Social Gatherings with Friends and Family:   . Attends Religious Services:   . Active Member of Clubs or Organizations:   . Attends Archivist Meetings:   Marland Kitchen Marital Status:   Intimate Partner Violence:   . Fear of Current or Ex-Partner:   . Emotionally Abused:   Marland Kitchen Physically Abused:   . Sexually Abused:      Family History:   Family History  Problem Relation Age of Onset  . Colon cancer Brother   . Cancer Brother   . Coronary artery disease Mother 71  . Hyperlipidemia Mother   . Hypertension Mother   . Stroke Maternal Aunt   . Esophageal cancer Neg Hx   . Stomach cancer Neg Hx   . Rectal cancer Neg Hx     ROS:  Review of Systems  Constitutional: Positive for malaise/fatigue. Negative for chills, diaphoresis, fever and weight loss.  HENT: Negative for congestion.   Eyes: Negative for discharge and redness.  Respiratory: Negative for cough, sputum production, shortness of breath and wheezing.   Cardiovascular: Positive for chest pain. Negative for palpitations, orthopnea, claudication, leg swelling and PND.  Gastrointestinal: Negative for abdominal pain, heartburn, nausea and vomiting.  Musculoskeletal: Negative for falls and myalgias.  Skin: Negative for rash.  Neurological: Positive for weakness. Negative for dizziness, tingling, tremors, sensory change, speech change, focal weakness and loss of consciousness.  Endo/Heme/Allergies: Does not bruise/bleed easily.  Psychiatric/Behavioral: Negative for substance abuse. The patient is not nervous/anxious.   All other systems reviewed and are negative.     Physical Exam/Data:   Vitals:   03/15/20 1239 03/15/20 1330 03/15/20 1348  03/15/20 1400  BP: (!) 106/43 (!) 84/58  (!) 87/46  Pulse: 71 65 (!) 107 60  Resp: 20 (!) 28 (!) 22 (!) 28  Temp:      TempSrc:      SpO2: 93% (!) 78% (!) 75% (!) 89%  Weight:        Intake/Output Summary (Last 24 hours) at 03/15/2020 1407 Last data filed at 03/15/2020 1254 Gross per 24 hour  Intake 475 ml  Output 10 ml  Net 465 ml   Filed Weights   03/15/20 0621  Weight: 50.8 kg   Body mass index is 20.49 kg/m.   Physical Exam: General: Well developed, well nourished, in no acute distress, frail-appearing. Head: Normocephalic, atraumatic, sclera non-icteric, no xanthomas, nares without discharge.  Neck: Negative for carotid bruits. JVD not elevated. Lungs: Clear bilaterally to auscultation without wheezes, rales, or rhonchi. Breathing is unlabored. Heart: RRR with S1 S2. II/VI murmurs, likely consistent with dialysis graft access, no rubs, or gallops appreciated. Abdomen: Soft, non-tender, non-distended with normoactive bowel sounds. No hepatomegaly. No rebound/guarding. No obvious abdominal masses. Msk:  Strength and tone appear normal for age. Extremities: No clubbing or cyanosis. No edema. Distal pedal pulses are 2+ and equal bilaterally. Neuro: Alert and oriented X 3. No facial asymmetry. No focal deficit. Moves all extremities spontaneously. Psych:  Responds to questions appropriately with a normal affect.   EKG:  The EKG was personally reviewed and demonstrates: 11:12 showed NSR, 71 bpm, incomplete RBBB with nonspecific lateral T wave changes.  Repeat EKG at 13:54 shows sinus arrhythmia, 67 bpm, incomplete RBBB, ST-T changes consistent with possible lead placement.  Repeat EKG at 14:51 showed NSR, 63 bpm, incomplete RBBB, and improved ST-T changes outside of continued TWI in V2 with peaked T waves. Telemetry:  Telemetry was personally reviewed and demonstrates: SR  Weights:  Filed Weights   03/15/20 0621  Weight: 50.8 kg    Relevant CV Studies:  Lexiscan MPI  02/20/2020:  Nuclear stress EF: 69%.  There was no ST segment deviation noted during stress.  The study is normal.  This is a low risk study.  The left ventricular ejection fraction is hyperdynamic (>65%). __________  2D echo 01/01/2020: 1. Left ventricular ejection fraction, by visual estimation, is 60 to  65%. The left ventricle has normal function. There is mildly increased  left ventricular hypertrophy.  2. Left ventricular diastolic parameters are consistent with Grade II  diastolic dysfunction (pseudonormalization).  3. The left ventricle has no regional wall motion abnormalities.  4. Global right ventricle has normal systolic function.The right  ventricular size is normal. No increase in right ventricular wall  thickness.  5. Left atrial size was mildly dilated.  6. Right atrial size was mildly dilated.  7. Mild mitral annular calcification.  8. The mitral valve is normal in structure. Mild mitral valve  regurgitation. No evidence of mitral stenosis.  9. The tricuspid valve is normal in structure. Tricuspid valve  regurgitation is moderate.  10. The aortic valve is tricuspid. Aortic valve regurgitation is not  visualized. Mild aortic valve sclerosis without stenosis.  11. The tricuspid regurgitant velocity is 3.66 m/s, and with an assumed  right atrial pressure of 3 mmHg, the estimated right ventricular systolic  pressure is moderately elevated at 56.6 mmHg.  12. The inferior vena cava is normal in size with greater than 50%  respiratory variability, suggesting right atrial pressure of 3 mmHg.   Laboratory Data:  Chemistry Recent Labs  Lab 03/14/20 0925 03/15/20 0632  NA 140 136  K 4.4 4.6  CL 92* 99  CO2 30  --   GLUCOSE 78 81  BUN 24* 37*  CREATININE 5.23* 7.90*  CALCIUM 9.8  --   GFRNONAA 8*  --   GFRAA 9*  --   ANIONGAP 18*  --     No results for input(s): PROT, ALBUMIN, AST, ALT, ALKPHOS, BILITOT in the last 168 hours. Hematology Recent Labs   Lab 03/14/20 0925 03/15/20 0632 03/15/20 1348  WBC 5.4  --  8.1  RBC 3.96  --  3.55*  HGB 11.9* 11.6* 10.7*  HCT 37.4 34.0* 34.7*  MCV 94.4  --  97.7  MCH 30.1  --  30.1  MCHC 31.8  --  30.8  RDW 16.6*  --  16.8*  PLT 247  --  266   Cardiac EnzymesNo results for input(s): TROPONINI in the last 168 hours. No results for input(s): TROPIPOC in the last 168 hours.  BNPNo results for input(s): BNP, PROBNP in the last 168 hours.  DDimer No results for input(s): DDIMER in the last 168 hours.  Radiology/Studies:  DG Chest Portable 1 View  Result Date: 03/15/2020 IMPRESSION: Enlargement of cardiac silhouette with vascular congestion and probable pulmonary edema, slightly improved. Electronically Signed   By: Lavonia Dana M.D.   On: 03/15/2020 11:10    Assessment and Plan:   1.  Chest pain with abnormal EKG: -Recent Lexiscan MPI showed no evidence of ischemia from less than 1 month ago -Cannot exclude possible symptoms and EKG changes occurring in the setting of recent nerve block and electrolyte abnormalities -Initial high-sensitivity troponin of 25 which is consistent with prior readings in the setting of her ESRD -Recommend cycling high-sensitivity troponin at 1548 (2 hours post initial draw) to evaluate for significant elevation -Repeat echo pending -No indication  for heparin drip at this time -Nitropaste for now with close monitoring of BP  2.  Hypotension: -Likely multifactorial including underlying ESRD with documented hypotension and in the postoperative state -Wean Neo-Synephrine as able -She has received 1300 mL of normal saline in total today, will need to be cautious with IV fluids moving forward given she is a renal patient  3.  Hyperkalemia: -No documentation of hemolysis from the lab -Likely contributing to her EKG changes -Nephrology has been consulted for HD -Last hemodialysis on 4/1  4.  Anemia of chronic disease:  -Stable   For questions or updates, please  contact [place] Please consult [email or web address] for contact info under Cardiology/STEMI.   Signed, Christell Faith, PA-C [place] Pager: [phone] 03/15/2020, 2:07 PM

## 2020-03-15 NOTE — Discharge Instructions (Signed)

## 2020-03-15 NOTE — Anesthesia Procedure Notes (Signed)
Procedure Name: LMA Insertion Performed by: Kelton Pillar, CRNA Pre-anesthesia Checklist: Patient identified, Emergency Drugs available, Suction available and Patient being monitored Patient Re-evaluated:Patient Re-evaluated prior to induction Oxygen Delivery Method: Circle system utilized Preoxygenation: Pre-oxygenation with 100% oxygen Induction Type: IV induction Ventilation: Mask ventilation without difficulty LMA: LMA inserted LMA Size: 3.0 Placement Confirmation: positive ETCO2,  CO2 detector and breath sounds checked- equal and bilateral Tube secured with: Tape Dental Injury: Teeth and Oropharynx as per pre-operative assessment

## 2020-03-15 NOTE — Progress Notes (Signed)
Patient came out of OR with Neo gtt, not started but if we needed it. I called Dr Rosey Bath and asked for an order and ask for parameters to have if we needed it. Dr Rosey Bath placed orders. Will continue to monitor.

## 2020-03-15 NOTE — Progress Notes (Signed)
2D echo finished, Dr Patsey Berthold and Dr Zella Richer have seen patient , aware of Potasium of 6.1 and troponin of 25.,

## 2020-03-15 NOTE — Progress Notes (Signed)
*  PRELIMINARY RESULTS* Echocardiogram 2D Echocardiogram has been performed.  [name] [name] 03/15/2020, 3:54 PM

## 2020-03-15 NOTE — Progress Notes (Signed)
Ch arrived at Pt's room in response to [place]. Care team was working on Pt to stabilize. Ch waited along with other staff, said silent prayer outside the room and left. No family present nearby at this time. Will ask incoming chaplain to check in on Pt.

## 2020-03-15 NOTE — H&P (Signed)
@LOGO @   MRN : [id number]  [name] is a 69 y.o. ([birth year]) female who presents with chief complaint of No chief complaint on file. Marland Kitchen  History of Present Illness:    The patient is seen for evaluation of dialysis access.  The patient has a history of failed access in the past.    Current access is via a catheter which is functioning adequately.  There have not been any episodes of catheter infection.  The patient denies amaurosis fugax or recent TIA symptoms. There are no recent neurological changes noted. The patient denies claudication symptoms or rest pain symptoms. The patient denies history of DVT, PE or superficial thrombophlebitis. The patient denies recent episodes of angina or shortness of breath.    Current Meds  Medication Sig  . allopurinol (ZYLOPRIM) 300 MG tablet Take 150 mg by mouth at bedtime.  . ALPRAZolam (XANAX) 0.25 MG tablet Take 0.25 mg by mouth daily as needed for anxiety.   Lorin Picket 1 GM 210 MG(Fe) tablet Take 420 mg by mouth 3 (three) times daily with meals.   . B Complex-C-Folic Acid (DIALYVITE 097) 0.8 MG WAFR Take 1 Wafer by mouth daily.  . cinacalcet (SENSIPAR) 30 MG tablet Take 30 mg by mouth daily.  Marland Kitchen labetalol (NORMODYNE) 200 MG tablet Take 100 mg by mouth at bedtime.  Marland Kitchen lactulose (CHRONULAC) 10 GM/15ML solution Take 20 g by mouth daily as needed for mild constipation.  Marland Kitchen losartan (COZAAR) 50 MG tablet Take 1 tablet (50 mg total) by mouth at bedtime.  . Methoxy PEG-Epoetin Beta (MIRCERA IJ) Mircera  . ondansetron (ZOFRAN) 8 MG tablet Take 8 mg by mouth 2 (two) times daily as needed for nausea or vomiting.   . simvastatin (ZOCOR) 20 MG tablet Take 1 tablet (20 mg total) by mouth at bedtime.  Marland Kitchen zolpidem (AMBIEN) 10 MG tablet Take 10 mg by mouth at bedtime as needed for sleep.     Past Medical History:  Diagnosis Date  . Adenomatous polyp of colon 10/2010, 2006, 2015  . Anemia in CKD (chronic kidney disease) 11/07/2012   s/p blood  transfusion.   . Arthritis   . CAD (coronary artery disease)    "something like that"  . CHF (congestive heart failure) ([place])   . Constipation   . Depression with anxiety   . Diverticula, colon   . 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  Had some early rejection then stable function for years, then had slow decline of function and went back on hemodialysis in 2012.  Gets HD TTS schedule at [facility] on [facility] still using L forearm AVF.     Marland Kitchen GERD (gastroesophageal reflux disease)   . GI bleed 2017   felt to be ischemic colitis, last colo 2015  . Headache   . Hyperlipidemia   . Hypertension   . Neurologic gait dysfunction   . Neuromuscular disorder (HCC)    neuropathy hand and legs  . Osteoporosis   . Pneumonia   . 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 Pulmonary edema 12/2019  . Stroke [facility]) 11/2015   TIA  . Weight loss, unintentional     Past Surgical History:  Procedure Laterality Date  . AV FISTULA PLACEMENT     for dialysis  . AV FISTULA PLACEMENT Left 11/22/2015   Procedure: ARTERIOVENOUS (AV) FISTULA CREATION-LEFT BRACHIOCEPHALIC;  Surgeon: Serafina Mitchell, MD;  Location: [place];  Service: Vascular;  Laterality: Left;  . BACK SURGERY    . CERVICAL FUSION    . CHOLECYSTECTOMY  12/02/2012   Procedure: LAPAROSCOPIC CHOLECYSTECTOMY WITH INTRAOPERATIVE CHOLANGIOGRAM;  Surgeon: Edward Jolly, MD;  Location: [place];  Service: General;  Laterality: N/A;  . EYE SURGERY Bilateral    cataract surgery  . EYE SURGERY Left 2019   laser  . HEMATOMA EVACUATION Left 12/24/2016   Procedure: EVACUATION HEMATOMA LEFT UPPER ARM;  Surgeon: Waynetta Sandy, MD;  Location: [place];  Service: Vascular;  Laterality: Left;  . I & D EXTREMITY Left 12/31/2016   Procedure: IRRIGATION AND DEBRIDEMENT EXTREMITY;  Surgeon: Angelia Mould, MD;  Location: [place];   Service: Vascular;  Laterality: Left;  . INSERTION OF DIALYSIS CATHETER Right 12/24/2016   Procedure: INSERTION OF DIALYSIS CATHETER;  Surgeon: Waynetta Sandy, MD;  Location: [place];  Service: Vascular;  Laterality: Right;  . INSERTION OF DIALYSIS CATHETER Right 02/04/2017   Procedure: INSERTION OF DIALYSIS CATHETER;  Surgeon: Waynetta Sandy, MD;  Location: [place];  Service: Vascular;  Laterality: Right;  . KIDNEY TRANSPLANT  1996  . PERIPHERAL VASCULAR CATHETERIZATION Left 10/23/2016   Procedure: Fistulagram;  Surgeon: Elam Dutch, MD;  Location: [place] CV [facility];  Service: Cardiovascular;  Laterality: Left;  . RESECTION OF ARTERIOVENOUS FISTULA ANEURYSM Left 11/22/2015   Procedure: RESECTION OF LEFT RADIOCEPHALIC FISTULA ANEURYSM ;  Surgeon: Serafina Mitchell, MD;  Location: [place];  Service: Vascular;  Laterality: Left;  . REVISON OF ARTERIOVENOUS FISTULA Left 12/22/2016   Procedure: REVISON OF LEFT ARTERIOVENOUS FISTULA;  Surgeon: Waynetta Sandy, MD;  Location: [place];  Service: Vascular;  Laterality: Left;  . REVISON OF ARTERIOVENOUS FISTULA Left 02/04/2017   Procedure: REVISON OF LEFT UPPER ARM ARTERIOVENOUS FISTULA;  Surgeon: Waynetta Sandy, MD;  Location: [place];  Service: Vascular;  Laterality: Left;  . UPPER EXTREMITY ANGIOGRAPHY Bilateral 09/19/2019   Procedure: UPPER EXTREMITY ANGIOGRAPHY;  Surgeon: Katha Cabal, MD;  Location: [place] CV [facility];  Service: Cardiovascular;  Laterality: Bilateral;    Social History Social History   Tobacco Use  . Smoking status: Former Smoker    Types: Cigarettes    Quit date: 12/31/1991    Years since quitting: 28.2  . Smokeless tobacco: Never Used  Substance Use Topics  . Alcohol use: No    Alcohol/week: 0.0 standard drinks  . Drug use: No    Family History Family History  Problem Relation Age of Onset  . Colon cancer Brother   . Cancer Brother   . Coronary artery disease Mother 9  .  Hyperlipidemia Mother   . Hypertension Mother   . Stroke Maternal Aunt   . Esophageal cancer Neg Hx   . Stomach cancer Neg Hx   . Rectal cancer Neg Hx     Allergies  Allergen Reactions  . Sulfa Antibiotics Other (See Comments)    Both parents allergic-so will not take  . Adhesive [Tape] Itching     REVIEW OF SYSTEMS (Negative unless checked)  Constitutional: [] Weight loss  [] Fever  [] Chills Cardiac: [] Chest pain   [] Chest pressure   [] Palpitations   [] Shortness of breath when laying flat   [] Shortness of breath with exertion. Vascular:  [] Pain in legs with walking   [] Pain in legs at rest  [] History of DVT   [] Phlebitis   [] Swelling in legs   [] Varicose veins   []   Non-healing ulcers Pulmonary:   [] Uses home oxygen   [] Productive cough   [] Hemoptysis   [] Wheeze  [] COPD   [] Asthma Neurologic:  [] Dizziness   [] Seizures   [] History of stroke   [] History of TIA  [] Aphasia   [] Vissual changes   [] Weakness or numbness in arm   [] Weakness or numbness in leg Musculoskeletal:   [] Joint swelling   [] Joint pain   [] Low back pain Hematologic:  [] Easy bruising  [] Easy bleeding   [] Hypercoagulable state   [] Anemic Gastrointestinal:  [] Diarrhea   [] Vomiting  [] Gastroesophageal reflux/heartburn   [] Difficulty swallowing. Genitourinary:  [x] Chronic kidney disease   [] Difficult urination  [] Frequent urination   [] Blood in urine Skin:  [] Rashes   [] Ulcers  Psychological:  [] History of anxiety   []  History of major depression.  Physical Examination  Vitals:   03/15/20 0621  BP: (!) 154/68  Pulse: 80  Resp: 12  Temp: (!) 97.4 F (36.3 C)  TempSrc: Tympanic  SpO2: 100%  Weight: 50.8 kg   Body mass index is 20.49 kg/m. Gen: WD/WN, NAD Head: [place]/AT, No temporalis wasting.  Ear/Nose/Throat: Hearing grossly intact, nares w/o erythema or drainage Eyes: PER, EOMI, sclera nonicteric.  Neck: Supple, no large masses.   Pulmonary:  Good air movement, no audible wheezing bilaterally, no use of  accessory muscles.  Cardiac: RRR, no JVD Vascular: tunneled cath CD&I Vessel Right Left  Radial Palpable Palpable  Brachial Palpable Palpable  Gastrointestinal: Non-distended. No guarding/no peritoneal signs.  Musculoskeletal: M/S 5/5 throughout.  No deformity or atrophy.  Neurologic: CN 2-12 intact. Symmetrical.  Speech is fluent. Motor exam as listed above. Psychiatric: Judgment intact, Mood & affect appropriate for pt's clinical situation. Dermatologic: No rashes or ulcers noted.  No changes consistent with cellulitis.   CBC Lab Results  Component Value Date   WBC 5.4 03/14/2020   HGB 11.6 (L) 03/15/2020   HCT 34.0 (L) 03/15/2020   MCV 94.4 03/14/2020   PLT 247 03/14/2020    BMET    Component Value Date/Time   NA 136 03/15/2020 0632   NA 140 07/13/2018 0814   K 4.6 03/15/2020 0632   CL 99 03/15/2020 0632   CO2 30 03/14/2020 0925   GLUCOSE 81 03/15/2020 0632   BUN 37 (H) 03/15/2020 0632   BUN 45 (H) 07/13/2018 0814   CREATININE 7.90 (H) 03/15/2020 0632   CALCIUM 9.8 03/14/2020 0925   CALCIUM 9.6 08/03/2011 1005   GFRNONAA 8 (L) 03/14/2020 0925   GFRAA 9 (L) 03/14/2020 0925   Estimated Creatinine Clearance: 5.3 mL/min (A) (by C-G formula based on SCr of 7.9 mg/dL (H)).  COAG Lab Results  Component Value Date   INR 1.1 03/14/2020   INR 1.10 02/23/2017   INR 1.13 11/19/2015    Radiology [place] OR NERVE BLOCK-IMAGE ONLY [facility])  Result Date: 03/15/2020 There is no interpretation for this exam.  This order is for images obtained during a surgical procedure.  Please See "Surgeries" Tab for more information regarding the procedure.   Myocardial Perfusion Imaging  Result Date: 02/20/2020  Nuclear stress EF: 69%.  There was no ST segment deviation noted during stress.  The study is normal.  This is a low risk study.  The left ventricular ejection fraction is hyperdynamic (>65%).       Assessment/Plan 1. ESRD (end stage renal disease) ([place]) Recommend:  At this  time the patient does not have appropriate extremity access for dialysis  Patient should have a right brachial axillary created.  The  risks, benefits and alternative therapies were reviewed in detail with the patient.  All questions were answered.  The patient agrees to proceed with surgery.    2. Gastro-esophageal reflux disease without esophagitis Continue PPI as already ordered, this medication has been reviewed and there are no changes at this time.  Avoidence of caffeine and alcohol  Moderate elevation of the head of the bed   3. Essential hypertension Continue antihypertensive medications as already ordered, these medications have been reviewed and there are no changes at this time.   4. Hyperlipidemia, unspecified hyperlipidemia type Continue statin as ordered and reviewed, no changes at this time  Hortencia Pilar, MD  03/15/2020 7:23 AM

## 2020-03-15 NOTE — Anesthesia Preprocedure Evaluation (Signed)
Anesthesia Evaluation  Patient identified by MRN, date of birth, ID band Patient awake    Reviewed: Allergy & Precautions, H&P , NPO status , Patient's Chart, lab work & pertinent test results, reviewed documented beta blocker date and time   History of Anesthesia Complications Negative for: history of anesthetic complications  Airway Mallampati: II  TM Distance: >3 FB Neck ROM: full    Dental no notable dental hx. (+) Partial Upper, Missing   Pulmonary neg pulmonary ROS, former smoker,    Pulmonary exam normal breath sounds clear to auscultation       Cardiovascular Exercise Tolerance: Good hypertension, (-) angina+ CAD and +CHF  (-) Past MI Normal cardiovascular exam(-) dysrhythmias + Valvular Problems/Murmurs  Rhythm:regular Rate:Normal     Neuro/Psych neg Seizures PSYCHIATRIC DISORDERS Anxiety Depression  Neuromuscular disease CVA, Residual Symptoms    GI/Hepatic Neg liver ROS, GERD  ,  Endo/Other  negative endocrine ROS  Renal/GU ESRF and DialysisRenal disease  negative genitourinary   Musculoskeletal   Abdominal   Peds  Hematology negative hematology ROS (+)   Anesthesia Other Findings Past Medical History: 10/2010, 2006, 2015: Adenomatous polyp of colon 11/07/2012: Anemia in CKD (chronic kidney disease)     Comment:  s/p blood transfusion.  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  Had deceased donor               kidney transplant in 1996.  Had some early rejection then              stable function for years, then had slow decline of               function and went back on hemodialysis in 2012.  Gets HD               TTS  schedule at [facility] on [facility] still using L               forearm AVF.    No date: GERD (gastroesophageal reflux disease) 2017: GI bleed     Comment:  felt to be ischemic colitis, last colo 2015 No date: Headache No date: Hyperlipidemia No date: Hypertension No date: Neurologic gait dysfunction No date: Neuromuscular disorder (HCC)     Comment:  neuropathy hand and legs No date: Osteoporosis No date: Pneumonia No date: Pseudoaneurysm of surgical AV fistula ([place])     Comment:  left upper arm 12/2019: Pulmonary edema 11/2015: Stroke ([place])     Comment:  TIA No date: Weight loss, unintentional   Reproductive/Obstetrics negative OB ROS                             Anesthesia Physical Anesthesia Plan  ASA: IV  Anesthesia Plan: General   Post-op Pain Management:    Induction: Intravenous  PONV Risk Score and Plan: 3 and Ondansetron, Dexamethasone and Treatment may vary due to age or medical condition  Airway Management Planned: LMA  Additional Equipment:   Intra-op Plan:   Post-operative Plan: Extubation in OR  Informed Consent: I have reviewed the patients History and Physical, chart, labs and discussed the procedure including the risks, benefits and alternatives for  the proposed anesthesia with the patient or authorized representative who has indicated his/her understanding and acceptance.     Dental Advisory Given  Plan Discussed with: Anesthesiologist, CRNA and Surgeon  Anesthesia Plan Comments:         Anesthesia Quick Evaluation

## 2020-03-15 NOTE — ED Provider Notes (Signed)
[place]  Department of Emergency Medicine   Code Blue CONSULT NOTE  Chief Complaint: Cardiac arrest/unresponsive   Level V Caveat: Unresponsive  History of present illness: I was contacted by the hospital for a CODE BLUE cardiac arrest upstairs and presented to the patient's bedside. Apparently the patient was just put on the bed pain when she became less responsive and went into v tach.   ROS: Unable to obtain, Level V caveat  Scheduled Meds: Continuous Infusions: PRN Meds:. Past Medical History:  Diagnosis Date  . Adenomatous polyp of colon 10/2010, 2006, 2015  . Anemia in CKD (chronic kidney disease) 11/07/2012   s/p blood transfusion.   . Arthritis   . CAD (coronary artery disease)    "something like that"  . CHF (congestive heart failure) ([place])   . Constipation   . Depression with anxiety   . Diverticula, colon   . 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  Had some early rejection then stable function for years, then had slow decline of function and went back on hemodialysis in 2012.  Gets HD TTS schedule at [facility] on [facility] still using L forearm AVF.     Marland Kitchen GERD (gastroesophageal reflux disease)   . GI bleed 2017   felt to be ischemic colitis, last colo 2015  . Headache   . Hyperlipidemia   . Hypertension   . Neurologic gait dysfunction   . Neuromuscular disorder (HCC)    neuropathy hand and legs  . Osteoporosis   . Pneumonia   . 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 Pulmonary edema 12/2019  . Stroke [facility]) 11/2015   TIA  . Weight loss, unintentional    Past Surgical History:  Procedure Laterality Date  . AV FISTULA PLACEMENT     for dialysis  . AV FISTULA PLACEMENT Left 11/22/2015   Procedure: ARTERIOVENOUS (AV) FISTULA CREATION-LEFT BRACHIOCEPHALIC;  Surgeon: Serafina Mitchell, MD;  Location: [place];  Service: Vascular;   Laterality: Left;  . BACK SURGERY    . CERVICAL FUSION    . CHOLECYSTECTOMY  12/02/2012   Procedure: LAPAROSCOPIC CHOLECYSTECTOMY WITH INTRAOPERATIVE CHOLANGIOGRAM;  Surgeon: Edward Jolly, MD;  Location: [place];  Service: General;  Laterality: N/A;  . EYE SURGERY Bilateral    cataract surgery  . EYE SURGERY Left 2019   laser  . HEMATOMA EVACUATION Left 12/24/2016   Procedure: EVACUATION HEMATOMA LEFT UPPER ARM;  Surgeon: Waynetta Sandy, MD;  Location: [place];  Service: Vascular;  Laterality: Left;  . I & D EXTREMITY Left 12/31/2016   Procedure: IRRIGATION AND DEBRIDEMENT EXTREMITY;  Surgeon: Angelia Mould, MD;  Location: [place];  Service: Vascular;  Laterality: Left;  . INSERTION OF DIALYSIS CATHETER Right 12/24/2016   Procedure: INSERTION OF DIALYSIS CATHETER;  Surgeon: Waynetta Sandy, MD;  Location: [place];  Service: Vascular;  Laterality: Right;  . INSERTION OF DIALYSIS CATHETER Right 02/04/2017   Procedure: INSERTION OF DIALYSIS CATHETER;  Surgeon: Waynetta Sandy, MD;  Location: [place];  Service: Vascular;  Laterality: Right;  . KIDNEY TRANSPLANT  1996  . PERIPHERAL VASCULAR CATHETERIZATION Left 10/23/2016   Procedure: Fistulagram;  Surgeon: Elam Dutch, MD;  Location: [place] [facility];  Service: Cardiovascular;  Laterality: Left;  . RESECTION OF ARTERIOVENOUS FISTULA ANEURYSM Left 11/22/2015   Procedure: RESECTION OF LEFT RADIOCEPHALIC FISTULA  ANEURYSM ;  Surgeon: Serafina Mitchell, MD;  Location: [place];  Service: Vascular;  Laterality: Left;  . REVISON OF ARTERIOVENOUS FISTULA Left 12/22/2016   Procedure: REVISON OF LEFT ARTERIOVENOUS FISTULA;  Surgeon: Waynetta Sandy, MD;  Location: [place];  Service: Vascular;  Laterality: Left;  . REVISON OF ARTERIOVENOUS FISTULA Left 02/04/2017   Procedure: REVISON OF LEFT UPPER ARM ARTERIOVENOUS FISTULA;  Surgeon: Waynetta Sandy, MD;  Location: [place];  Service: Vascular;  Laterality: Left;  .  UPPER EXTREMITY ANGIOGRAPHY Bilateral 09/19/2019   Procedure: UPPER EXTREMITY ANGIOGRAPHY;  Surgeon: Katha Cabal, MD;  Location: [place] [facility];  Service: Cardiovascular;  Laterality: Bilateral;   Social History   Socioeconomic History  . Marital status: Widowed    Spouse name: Not on file  . Number of children: 2  . Years of education: Not on file  . Highest education level: Not on file  Occupational History  . Not on file  Tobacco Use  . Smoking status: Former Smoker    Types: Cigarettes    Quit date: 12/31/1991    Years since quitting: 28.2  . Smokeless tobacco: Never Used  Substance and Sexual Activity  . Alcohol use: No    Alcohol/week: 0.0 standard drinks  . Drug use: No  . Sexual activity: Not Currently  Other Topics Concern  . Not on file  Social History Narrative   Living with her mother    Right handed   Caffeine: only decaf clears ("no brown sodas" or coffee d/t kidney disease)   Low potassium foods d/t kidney disease   Social Determinants of Health   Financial Resource Strain:   . Difficulty of Paying Living Expenses:   Food Insecurity:   . Worried About Charity fundraiser in the Last Year:   . Arboriculturist in the Last Year:   Transportation Needs:   . Film/video editor (Medical):   Marland Kitchen Lack of Transportation (Non-Medical):   Physical Activity:   . Days of Exercise per Week:   . Minutes of Exercise per Session:   Stress:   . Feeling of Stress :   Social Connections:   . Frequency of Communication with Friends and Family:   . Frequency of Social Gatherings with Friends and Family:   . Attends Religious Services:   . Active Member of Clubs or Organizations:   . Attends Archivist Meetings:   Marland Kitchen Marital Status:   Intimate Partner Violence:   . Fear of Current or Ex-Partner:   . Emotionally Abused:   Marland Kitchen Physically Abused:   . Sexually Abused:    Allergies  Allergen Reactions  . Sulfa Antibiotics Other (See Comments)     Both parents allergic-so will not take  . Adhesive [Tape] Itching    Last set of Vital Signs (not current) Vitals:   01/02/20 0744 01/02/20 1155  BP: 120/77 111/61  Pulse: 75 73  Resp: 15 15  Temp: 98.2 F (36.8 C) 97.7 F (36.5 C)  SpO2: 96% 100%      Physical Exam  Gen: unresponsive Cardiovascular: faint pulse Resp: agonal breathing. Abd: nondistended  Neuro: GCS 3, unresponsive to pain  HEENT: No blood in posterior pharynx, gag reflex absent  Neck: No crepitus  Musculoskeletal: No deformity  Skin: warm  Procedures   CRITICAL CARE Performed by: Nance Pear Total critical care time: 15 Critical care time was exclusive of separately billable procedures and treating other patients. Critical care was necessary  to treat or prevent imminent or life-threatening deterioration. Critical care was time spent personally by me on the following activities: development of treatment plan with patient and/or surrogate as well as nursing, discussions with consultants, evaluation of patient's response to treatment, examination of patient, obtaining history from patient or surrogate, ordering and performing treatments and interventions, ordering and review of laboratory studies, ordering and review of radiographic studies, pulse oximetry and re-evaluation of patient's condition.  Medical Decision making  Arrived to patient's bedside. Per cardiac monitor patient appeared to be in v tach. Patient was shocked and came back to a sinus rhythm. It did appear that patient might have been having a seizure when she was in v tach. The patient initially had agonal breathing and there was thought to intubate, however she did fairly quickly become responsive again and would open her eyes to verbal stimuli.  Decision was made to hold off on intubation at this time.      Nance Pear, MD 03/15/20 2233

## 2020-03-15 NOTE — Progress Notes (Signed)
eLink Physician-Brief Progress Note Patient Name: [name] DOB: Nov 05, 1951 MRN: [id number]   Date of Service  03/15/2020  HPI/Events of Note  38 F ESRD on HD admitted for AV graft placement. She had EKG changes while at the PACU and cardiology consulted. Dialysis was about to initiated when patient went into afib RVR 160s after being placed on the bedpan. Also had a seizure episode that resolved spontaneously. Cardioverted back to sinus. Still with PVCs on EKG. K 6.1. Glucose 69. Unable to give D50 push as no stable access. ED MD and hospitalist at bedside and was arranging to intubate but patient started waking up and is following commands.  eICU Interventions  Labs to be drawn now Ordered calcium gluconate, D50/insulin  Unable to give D50 push as no stable access. Will shift IVF to D5 1/2 NS to run at 40 cc/hr  Dialysis to be initiated ASAP. Likely etiology of arrhythmia and seizure is secondary to electrolyte derangement.        Judd Lien 03/15/2020, 10:28 PM

## 2020-03-15 NOTE — Progress Notes (Signed)
Called Dr Rosey Bath, patient is restless and states "my chest feels funny." patient not able to describe it, "like I have to hiccup", denies tightness, pressure. This was not how she was when she came to PACU. Dr Rosey Bath at bedside. Verbal order for Chest x-ray and ECG.

## 2020-03-15 NOTE — Op Note (Signed)
OPERATIVE NOTE   PROCEDURE: right brachial axillary arteriovenous graft placement  PRE-OPERATIVE DIAGNOSIS: End Stage Renal Disease  POST-OPERATIVE DIAGNOSIS: End Stage Renal Disease  SURGEON: Hortencia Pilar  ASSISTANT(S): Ms. Hezzie Bump  ANESTHESIA: regional  ESTIMATED BLOOD LOSS: <50 cc  FINDING(S): Cephalic vein was explored and a 2 mm Donna Christen coronary dilator would not pass and therefore we proceeded with the brachial axillary graft as discussed with the patient  SPECIMEN(S):  none  INDICATIONS:   [name] is a 69 y.o. female who presents with end stage renal disease.  The patient is scheduled for right brachial axillary AV graft placement.  The patient is aware the risks include but are not limited to: bleeding, infection, steal syndrome, nerve damage, ischemic monomelic neuropathy, failure to mature, and need for additional procedures.  The patient is aware of the risks of the procedure and elects to proceed forward.  DESCRIPTION: After full informed written consent was obtained from the patient, the patient was brought back to the operating room and placed supine upon the operating table.  Prior to induction, the patient received IV antibiotics.   After obtaining adequate anesthesia, the patient was then prepped and draped in the standard fashion for a right arm access procedure.   A first assistant was required to provide a safe and appropriate environment for executing the surgery.  The assistant was integral in providing retraction, exposure, running suture providing suction and in the closing process.   A linear incision was then created along the medial border of the biceps muscle just proximal to the antecubital crease and the brachial artery which was exposed through. The brachial artery was then looped proximally and distally with Silastic Vesseloops. Side branches were controlled with 4-0 silk ties.  Attention was then turned to the exposure of  the axillary vein. Linear incision was then created medial to the proximal portion of the biceps at the level of the anterior axillary crease. The axillary vein was exposed and again looped proximally and distally with Silastic vessel loops. Associated tributaries were also controlled with Silastic Vesseloops.  The Gore tunneler was then delivered onto the field and a subcutaneous path was made from the arterial incision to the venous incision. A 4-7 tapered PTFE propatent graft by Simeon Craft was then pulled through the subcutaneous tunnel. The arterial 4 mm portion was then approximated to the brachial artery. Brachial artery was controlled proximally and distally with the Silastic Vesseloops. Arteriotomy was made with an 11 blade scalpel and extended with Potts scissors and a 6-0 Prolene stay suture was placed. End graft to side brachial artery anastomosis was then fashioned with running CV 6 suture. Flushing maneuvers were performed suture line was hemostatic and the graft was then assessed for proper position and ease of future cannulation. Heparinized saline was infused into the vein and the graft was clamped with a vascular clamp. With the graft pressurized it was approximated to the axillary vein in its native bed and then marked with a surgical marker. The vein was then delivered into the surgical field and controlled with the Silastic vessel loops. Venotomy was then made with an 11 blade scalpel and extended with Potts scissors and a 6-0 Prolene suture was used as stay suture. The the graft was then sewn to the vein in an end graft to side vein fashion using running CV 6 suture.  Flushing maneuvers were performed and the artery was allowed to forward and back bleed.  Flow was then established  through the AV graft  There was good  thrill in the venous outflow, and there was 1+ palpable radial pulse.  At this point, I irrigated out the surgical wounds.  There was no further active bleeding.  The subcutaneous  tissue was reapproximated with a running stitch of 3-0 Vicryl.  The skin was then reapproximated with a running subcuticular stitch of 4-0 Vicryl.  The skin was then cleaned, dried, and reinforced with Dermabond.    The patient tolerated this procedure well.   COMPLICATIONS: None  CONDITION: [facility]  Office: [phone]   03/15/2020, 9:33 AM

## 2020-03-15 NOTE — Progress Notes (Signed)
This note also relates to the following rows which could not be included: Pulse Rate - Cannot attach notes to unvalidated device data SpO2 - Cannot attach notes to unvalidated device data  Hd initiated

## 2020-03-15 NOTE — H&P (Addendum)
History and Physical    [name]:[id number] DOB: Aug 07, 1951 DOA: 03/15/2020  PCP: [name], MD   Patient coming from: home  I have personally briefly reviewed patient's old medical records in [place]  Chief Complaint: Status post emergency cardioversion ventricular tachycardia.  Vascular service, Dr Trula Slade requests ICU care  HPI: [name] is a 69 y.o. female with medical history significant for ESRD on HD MWF, anemia related to ESRD, diastolic heart failure, hypertension, admitted earlier by the vascular service for elective placement of right upper extremity dialysis catheter, using brachial nerve block, indicated for poorly functioning left upper extremity access.  While in the recovery unit, patient became hypotensive with systolic blood pressure in the 70s.  Blood work done at the time revealed potassium of 6.1.  Patient was given a fluid bolus of 1800 mL and also started on a Neo-Synephrine infusion and transferred to the intensive care unit.  Patient developed chest pain and was noted to have some ST-T wave changes.  She was evaluated by both cardiology and the intensivist at that time.  She had an echocardiogram that showed new wall motion abnormality possibly stress cardiomyopathy, post surgery and hypotension in the setting of hyperkalemia.  Troponin trend was recommended with continued observation in the ICU.   Patient's blood pressure responded to the intervention  Dialysis was initiated some hours later after arriving in the ICU.  Within 2 hours of starting dialysis, patient , became unresponsive and ventricular tachycardia of 160 noted on the monitor.  She remained with a pulse.  CODE BLUE was called.  She underwent immediate DC cardioversion, converting back to sinus rhythm with heart rate in the 70s and 80s.  At the same time, she was noted to have leftward gaze deviation, a presumed seizure, lasting less than a minute.  She was about to be intubated  then the seizure resolved spontaneously, and decision made not to intubate. Vascular surgeon, Dr. Trula Slade requested transfer of patient to medical ICU service at this time.  Patient is presently drowsy, but will arouse briefly when her name is called.  She is currently on Bipap. E-link is involved remotely.   Review of Systems: Unable to obtain due to lethargy,  post ictal  Past Medical History:  Diagnosis Date  . Adenomatous polyp of colon 10/2010, 2006, 2015  . Anemia in CKD (chronic kidney disease) 11/07/2012   s/p blood transfusion.   . Arthritis   . CAD (coronary artery disease)    "something like that"  . CHF (congestive heart failure) ([place])   . Constipation   . Depression with anxiety   . Diverticula, colon   . 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  Had some early rejection then stable function for years, then had slow decline of function and went back on hemodialysis in 2012.  Gets HD TTS schedule at [facility] still using L forearm AVF.     Marland Kitchen GERD (gastroesophageal reflux disease)   . GI bleed 2017   felt to be ischemic colitis, last colo 2015  . Headache   . Hyperlipidemia   . Hypertension   . Neurologic gait dysfunction   . Neuromuscular disorder (HCC)    neuropathy hand and legs  . Osteoporosis   . Pneumonia   . 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 Pulmonary edema 12/2019  .  Stroke [facility]) 11/2015   TIA  . Weight loss, unintentional     Past Surgical History:  Procedure Laterality Date  . AV FISTULA PLACEMENT     for dialysis  . AV FISTULA PLACEMENT Left 11/22/2015   Procedure: ARTERIOVENOUS (AV) FISTULA CREATION-LEFT BRACHIOCEPHALIC;  Surgeon: Serafina Mitchell, MD;  Location: [place];  Service: Vascular;  Laterality: Left;  . BACK SURGERY    . CERVICAL FUSION    . CHOLECYSTECTOMY  12/02/2012   Procedure: LAPAROSCOPIC CHOLECYSTECTOMY  WITH INTRAOPERATIVE CHOLANGIOGRAM;  Surgeon: Edward Jolly, MD;  Location: [place];  Service: General;  Laterality: N/A;  . EYE SURGERY Bilateral    cataract surgery  . EYE SURGERY Left 2019   laser  . HEMATOMA EVACUATION Left 12/24/2016   Procedure: EVACUATION HEMATOMA LEFT UPPER ARM;  Surgeon: Waynetta Sandy, MD;  Location: [place];  Service: Vascular;  Laterality: Left;  . I & D EXTREMITY Left 12/31/2016   Procedure: IRRIGATION AND DEBRIDEMENT EXTREMITY;  Surgeon: Angelia Mould, MD;  Location: [place];  Service: Vascular;  Laterality: Left;  . INSERTION OF DIALYSIS CATHETER Right 12/24/2016   Procedure: INSERTION OF DIALYSIS CATHETER;  Surgeon: Waynetta Sandy, MD;  Location: [place];  Service: Vascular;  Laterality: Right;  . INSERTION OF DIALYSIS CATHETER Right 02/04/2017   Procedure: INSERTION OF DIALYSIS CATHETER;  Surgeon: Waynetta Sandy, MD;  Location: [place];  Service: Vascular;  Laterality: Right;  . KIDNEY TRANSPLANT  1996  . PERIPHERAL VASCULAR CATHETERIZATION Left 10/23/2016   Procedure: Fistulagram;  Surgeon: Elam Dutch, MD;  Location: [place] CV LAB;  Service: Cardiovascular;  Laterality: Left;  . RESECTION OF ARTERIOVENOUS FISTULA ANEURYSM Left 11/22/2015   Procedure: RESECTION OF LEFT RADIOCEPHALIC FISTULA ANEURYSM ;  Surgeon: Serafina Mitchell, MD;  Location: [place];  Service: Vascular;  Laterality: Left;  . REVISON OF ARTERIOVENOUS FISTULA Left 12/22/2016   Procedure: REVISON OF LEFT ARTERIOVENOUS FISTULA;  Surgeon: Waynetta Sandy, MD;  Location: [place];  Service: Vascular;  Laterality: Left;  . REVISON OF ARTERIOVENOUS FISTULA Left 02/04/2017   Procedure: REVISON OF LEFT UPPER ARM ARTERIOVENOUS FISTULA;  Surgeon: Waynetta Sandy, MD;  Location: [place];  Service: Vascular;  Laterality: Left;  . UPPER EXTREMITY ANGIOGRAPHY Bilateral 09/19/2019   Procedure: UPPER EXTREMITY ANGIOGRAPHY;  Surgeon: Katha Cabal, MD;   Location: [place] CV LAB;  Service: Cardiovascular;  Laterality: Bilateral;     reports that she quit smoking about 28 years ago. Her smoking use included cigarettes. She has never used smokeless tobacco. She reports that she does not drink alcohol or use drugs.  Allergies  Allergen Reactions  . Sulfa Antibiotics Other (See Comments)    Both parents allergic-so will not take  . Adhesive [Tape] Itching    Family History  Problem Relation Age of Onset  . Colon cancer Brother   . Cancer Brother   . Coronary artery disease Mother 59  . Hyperlipidemia Mother   . Hypertension Mother   . Stroke Maternal Aunt   . Esophageal cancer Neg Hx   . Stomach cancer Neg Hx   . Rectal cancer Neg Hx      Prior to Admission medications   Medication Sig Start Date End Date Taking? Authorizing Provider  allopurinol (ZYLOPRIM) 300 MG tablet Take 150 mg by mouth at bedtime.   Yes [provider]  ALPRAZolam (XANAX) 0.25 MG tablet Take 0.25 mg by mouth daily as needed for anxiety.  10/18/19  Yes [provider]  AURYXIA 1 GM 210 MG(Fe) tablet Take 420 mg by mouth 3 (three) times daily with meals.  07/20/19  Yes [provider]  B Complex-C-Folic Acid (DIALYVITE 518) 0.8 MG WAFR Take 1 Wafer by mouth daily. 10/25/18  Yes [provider]  cinacalcet (SENSIPAR) 30 MG tablet Take 30 mg by mouth daily. 01/04/20  Yes [provider]  labetalol (NORMODYNE) 200 MG tablet Take 100 mg by mouth at bedtime. 01/31/20  Yes [provider]  lactulose (CHRONULAC) 10 GM/15ML solution Take 20 g by mouth daily as needed for mild constipation.   Yes [provider]  losartan (COZAAR) 50 MG tablet Take 1 tablet (50 mg total) by mouth at bedtime. 12/14/18  Yes Mariel Aloe, MD  Methoxy PEG-Epoetin Beta (MIRCERA IJ) Mircera 01/15/20 01/13/21 Yes [provider]  ondansetron (ZOFRAN) 8 MG tablet Take 8 mg by mouth 2 (two) times daily as needed for nausea or  vomiting.  01/20/18  Yes [provider]  simvastatin (ZOCOR) 20 MG tablet Take 1 tablet (20 mg total) by mouth at bedtime. 02/26/17  Yes Velvet Bathe, MD  zolpidem (AMBIEN) 10 MG tablet Take 10 mg by mouth at bedtime as needed for sleep.    Yes [provider]  acetaminophen (TYLENOL) 500 MG tablet Take 1 tablet (500 mg total) by mouth every 6 (six) hours as needed. Patient taking differently: Take 500 mg by mouth every 6 (six) hours as needed for mild pain.  09/05/18   Law, Bea Graff, PA-C  HYDROcodone-acetaminophen (NORCO) 5-325 MG tablet Take 1-2 tablets by mouth every 6 (six) hours as needed for moderate pain or severe pain. 03/15/20   Schnier, Dolores Lory, MD    Physical Exam: Vitals:   03/15/20 2100 03/15/20 2130 03/15/20 2200 03/15/20 2231  BP: (!) 113/53 (!) 135/25 (!) 106/52 (!) 111/57  Pulse: (!) 58 (!) 58 (!) 57   Resp: 20 (!) 25 (!) 26 (!) 27  Temp:      TempSrc:      SpO2: 99% 97% 98%   Weight:      Height:         Vitals:   03/15/20 2100 03/15/20 2130 03/15/20 2200 03/15/20 2231  BP: (!) 113/53 (!) 135/25 (!) 106/52 (!) 111/57  Pulse: (!) 58 (!) 58 (!) 57   Resp: 20 (!) 25 (!) 26 (!) 27  Temp:      TempSrc:      SpO2: 99% 97% 98%   Weight:      Height:        Constitutional: Somnolent but easily arousable but readily falls back asleep, no apparent distress, on BiPAP Eyes: PERLA, EOMI, irises appear normal, anicteric sclera,  ENMT: No apparent abnormality.  BiPAP meds full examination  Neck: neck appears normal, no masses, normal ROM, no thyromegaly, no JVD  CVS: S1-S2 clear, no murmur rubs or gallops,  , no carotid bruits, pedal pulses palpable, No LE edema.  Right upper extremity dialysis access Respiratory: clear to auscultation bilaterally, no wheezing, rales or rhonchi. Respiratory effort normal Abdomen: soft nontender, nondistended, normal bowel sounds, no hepatosplenomegaly, no hernias Musculoskeletal: : no cyanosis, clubbing ,  Neuro:  Grossly intact Skin: no rashes or lesions or ulcers, no induration or nodules   Labs on Admission: I have personally reviewed following labs and imaging studies  CBC: Recent Labs  Lab 03/14/20 0925 03/15/20 0632 03/15/20 1348  WBC 5.4  --  8.1  NEUTROABS 2.7  --   --  HGB 11.9* 11.6* 10.7*  HCT 37.4 34.0* 34.7*  MCV 94.4  --  97.7  PLT 247  --  412   Basic Metabolic Panel: Recent Labs  Lab 03/14/20 0925 03/15/20 0632 03/15/20 1348  NA 140 136 140  K 4.4 4.6 6.1*  CL 92* 99 98  CO2 30  --  22  GLUCOSE 78 81 144*  BUN 24* 37* 46*  CREATININE 5.23* 7.90* 7.81*  CALCIUM 9.8  --  9.4   GFR: Estimated Creatinine Clearance: 5.4 mL/min (A) (by C-G formula based on SCr of 7.81 mg/dL (H)). Liver Function Tests: No results for input(s): AST, ALT, ALKPHOS, BILITOT, PROT, ALBUMIN in the last 168 hours. No results for input(s): LIPASE, AMYLASE in the last 168 hours. No results for input(s): AMMONIA in the last 168 hours. Coagulation Profile: Recent Labs  Lab 03/14/20 0925  INR 1.1   Cardiac Enzymes: No results for input(s): CKTOTAL, CKMB, CKMBINDEX, TROPONINI in the last 168 hours. BNP (last 3 results) No results for input(s): PROBNP in the last 8760 hours. HbA1C: No results for input(s): HGBA1C in the last 72 hours. CBG: Recent Labs  Lab 03/15/20 1949 03/15/20 2238 03/15/20 2331  GLUCAP 125* 69* 85   Lipid Profile: No results for input(s): CHOL, HDL, LDLCALC, TRIG, CHOLHDL, LDLDIRECT in the last 72 hours. Thyroid Function Tests: Recent Labs    03/15/20 1230  TSH 2.793   Anemia Panel: No results for input(s): VITAMINB12, FOLATE, FERRITIN, TIBC, IRON, RETICCTPCT in the last 72 hours. Urine analysis:    Component Value Date/Time   COLORURINE YELLOW 01/30/2011 1648   APPEARANCEUR CLOUDY (A) 01/30/2011 1648   LABSPEC 1.016 01/30/2011 1648   PHURINE 5.0 01/30/2011 1648   GLUCOSEU NEGATIVE 10/14/2009 0747   HGBUR SMALL (A) 01/30/2011 1648   BILIRUBINUR  NEGATIVE 01/30/2011 1648   KETONESUR NEGATIVE 01/30/2011 1648   PROTEINUR 100 (A) 01/30/2011 1648   UROBILINOGEN 0.2 01/30/2011 1648   NITRITE NEGATIVE 01/30/2011 1648   LEUKOCYTESUR SMALL (A) 01/30/2011 1648    Radiological Exams on Admission: DG Chest Portable 1 View  Result Date: 03/15/2020 CLINICAL DATA:  Shortness of breath EXAM: PORTABLE CHEST 1 VIEW COMPARISON:  Portable exam 1102 hours compared to 01/01/2020 FINDINGS: Enlargement of cardiac silhouette with pulmonary vascular congestion. Atherosclerotic calcification aorta. Peribronchial thickening with accentuated interstitial markings likely representing pulmonary edema and CHF, slightly improved from previous study. No pleural effusion or pneumothorax. Bones demineralized with degenerative changes of the thoracic spine and probable chronic RIGHT rotator cuff tear. IMPRESSION: Enlargement of cardiac silhouette with vascular congestion and probable pulmonary edema, slightly improved. Electronically Signed   By: Lavonia Dana M.D.   On: 03/15/2020 11:10   [place] OR NERVE BLOCK-IMAGE ONLY [facility])  Result Date: 03/15/2020 There is no interpretation for this exam.  This order is for images obtained during a surgical procedure.  Please See "Surgeries" Tab for more information regarding the procedure.   ECHOCARDIOGRAM LIMITED  Result Date: 03/15/2020    ECHOCARDIOGRAM LIMITED REPORT   Patient Name:   [name] Date of Exam: 03/15/2020 Medical Rec #:  [id number]         Height:       62.0 in Accession #:    [id number]        Weight:       112.0 lb Date of Birth:  1951-06-02         BSA:          1.494 m Patient Age:  68 years          BP:           119/55 mmHg Patient Gender: F                 HR:           62 bpm. Exam Location:  [facility] Procedure: 2D Echo, Cardiac Doppler and Color Doppler Indications:     Acute Respiratory Insufficiency  History:         Patient has prior history of Echocardiogram examinations. CHF,                  CAD; Risk  Factors:Hypertension.  Sonographer:     Alyse Low Roar Referring Phys:  [id number] CARMEN Veda Canning Diagnosing Phys: Ida Rogue MD IMPRESSIONS  1. Left ventricular ejection fraction, by estimation, is 50 to 55%. The left ventricle has low normal function. The left ventricle demonstrates regional wall motion abnormalities (distal anteroseptal and apical region, concerning for condution abnormality). There is moderate left ventricular hypertrophy. Left ventricular diastolic parameters are consistent with Grade I diastolic dysfunction (impaired relaxation).  2. Right ventricular systolic function is normal. The right ventricular size is normal. There is severely elevated pulmonary artery systolic pressure. The estimated right ventricular systolic pressure is 09.3 mmHg.  3. Left atrial size was moderately dilated.  4. Right atrial size was mildly dilated.  5. Tricuspid valve regurgitation is moderate to severe.  6. The inferior vena cava is dilated in size with <50% respiratory variability, suggesting right atrial pressure of 15 mmHg. FINDINGS  Left Ventricle: Left ventricular ejection fraction, by estimation, is 50 to 55%. The left ventricle has low normal function. The left ventricle demonstrates regional wall motion abnormalities. The left ventricular internal cavity size was normal in size. There is moderate left ventricular hypertrophy. Right Ventricle: The right ventricular size is normal. No increase in right ventricular wall thickness. Right ventricular systolic function is normal. There is severely elevated pulmonary artery systolic pressure. The tricuspid regurgitant velocity is 3.86 m/s, and with an assumed right atrial pressure of 15 mmHg, the estimated right ventricular systolic pressure is 81.8 mmHg. Left Atrium: Left atrial size was moderately dilated. Right Atrium: Right atrial size was mildly dilated. Pericardium: There is no evidence of pericardial effusion. Mitral Valve: The mitral valve is normal in  structure. Normal mobility of the mitral valve leaflets. No evidence of mitral valve stenosis. Tricuspid Valve: The tricuspid valve is normal in structure. Tricuspid valve regurgitation is moderate to severe. No evidence of tricuspid stenosis. Aortic Valve: The aortic valve is normal in structure. Aortic valve regurgitation is not visualized. Mild to moderate aortic valve sclerosis/calcification is present, without any evidence of aortic stenosis. Aortic valve mean gradient measures 7.3 mmHg. Aortic valve peak gradient measures 13.9 mmHg. Aortic valve area, by VTI measures 2.26 cm. Pulmonic Valve: The pulmonic valve was normal in structure. Pulmonic valve regurgitation is not visualized. No evidence of pulmonic stenosis. Aorta: The aortic root is normal in size and structure. Venous: The inferior vena cava is dilated in size with less than 50% respiratory variability, suggesting right atrial pressure of 15 mmHg. IAS/Shunts: No atrial level shunt detected by color flow Doppler.  LEFT VENTRICLE PLAX 2D LVIDd:         3.49 cm  Diastology LVIDs:         2.42 cm  LV e' lateral:   7.29 cm/s LV PW:         1.39 cm  LV  E/e' lateral: 11.5 LV IVS:        1.54 cm  LV e' medial:    5.44 cm/s LVOT diam:     1.80 cm  LV E/e' medial:  15.4 LV SV:         86 LV SV Index:   58 LVOT Area:     2.54 cm  RIGHT VENTRICLE RV Mid diam:    4.29 cm RV S prime:     12.50 cm/s TAPSE (M-mode): 2.4 cm LEFT ATRIUM             Index       RIGHT ATRIUM           Index LA diam:        3.30 cm 2.21 cm/m  RA Area:     23.10 cm LA Vol (A2C):   82.5 ml 55.21 ml/m RA Volume:   77.40 ml  51.80 ml/m LA Vol (A4C):   43.3 ml 28.98 ml/m LA Biplane Vol: 60.5 ml 40.49 ml/m  AORTIC VALVE                    PULMONIC VALVE AV Area (Vmax):    1.99 cm     PV Vmax:        0.86 m/s AV Area (Vmean):   2.02 cm     PV Peak grad:   2.9 mmHg AV Area (VTI):     2.26 cm     RVOT Peak grad: 1 mmHg AV Vmax:           186.33 cm/s AV Vmean:          124.667 cm/s AV  VTI:            0.380 m AV Peak Grad:      13.9 mmHg AV Mean Grad:      7.3 mmHg LVOT Vmax:         146.00 cm/s LVOT Vmean:        99.000 cm/s LVOT VTI:          0.338 m LVOT/AV VTI ratio: 0.89  AORTA Ao Root diam: 2.50 cm MITRAL VALVE               TRICUSPID VALVE MV Area (PHT): 2.74 cm    TR Peak grad:   59.6 mmHg MV Decel Time: 277 msec    TR Vmax:        386.00 cm/s MV E velocity: 83.90 cm/s MV A velocity: 98.50 cm/s  SHUNTS MV E/A ratio:  0.85        Systemic VTI:  0.34 m                            Systemic Diam: 1.80 cm Ida Rogue MD Electronically signed by Ida Rogue MD Signature Date/Time: 03/15/2020/5:03:50 PM    Final     EKG: Independently reviewed.   Assessment/Plan  Patient admitted to the ICU on behalf of intensivist.  Further ICU management will be under E-link    Sustained Ventricular tachycardia [facility]) s/p emergency cardioversion -Suspect related to electrolyte derangement from delayed dialysis session in combination with decreased perfusion/tissue hypoxia decreased related to postprocedural hypotension -Status post emergency electrical cardioversion, now in normal sinus rhythm -Monitor and correct electrolyte disturbance -Supplemental oxygen to keep sats over 94% -Defer to e-link for continued ICU management  Acute metabolic encephalopathy/ Unresponsive episode Acute seizure -Secondary to hyperkalemia and metabolic derangements from  dialysis session delayed on account of placement of new dialysis access -Patient did have a period of decreased perfusion during her hypotensive episode which may have contributed -Correct electrolyte imbalance -Neurologic check --supplemental oxygen    Hyperkalemia -Patient received calcium gluconate and is being dialyzed. -We will get postdialysis draw  Postprocedural hypotension -Patient developed hypotension with systolic in the 06V recovery from placement of right upper extremity dialysis catheter -Blood pressure responded to IV  fluid bolus along with Neo-Synephrine infusion -Currently off Neo-Synephrine and maintaining adequate perfusion  Chest pain post procedure -Patient was evaluated by cardiology Dr. Rockey Situ earlier and echocardiogram was done showing new wall motion abnormality possibly related to stress cardiomyopathy -Initial troponin done earlier was 25 -Repeat troponin, though expected to be elevated given electrical cardioversion -May start heparin  Status post placement of right upper extremity dialysis catheter due to complication of left upper extremity access ESRD on HD MWF -Nephrology consult for continued management -Vascular surgery consult for continued management of access    Anemia in chronic kidney disease -To renal for continued management    Chronic diastolic CHF (congestive heart failure) ([place]) -Not acutely exacerbated     DVT prophylaxis: heparin Code Status: full code  Family Communication:  Nurse updated sister  Disposition Plan: Back to previous home environment Consults called: cardiology and renal and E-link involvedStatus:inpatient    Athena Masse MD [facility]     03/15/2020, 11:44 PM

## 2020-03-15 NOTE — Progress Notes (Signed)
eLink Physician-Brief Progress Note Patient Name: [name] DOB: 1951-06-17 MRN: [id number]   Date of Service  03/15/2020  HPI/Events of Note  Notified that labs will be ordered earlier after dialysis since patient is a hard stick. Also requesting for hemoccult due to dark stools. Patient complaining of nausea.  eICU Interventions   Ok to draw labs earlier  Patient on iron supplement that can cause stool to be dark but will order hemoccult to confirm  Ordered GI cocktail. Already given zofran and has Pepcid ordered as well     Intervention Category Major Interventions: Other:  Judd Lien 03/15/2020, 10:01 PM

## 2020-03-15 NOTE — Transfer of Care (Signed)
Immediate Anesthesia Transfer of Care Note  Patient: [name]  Procedure(s) Performed: INSERTION OF ARTERIOVENOUS (AV) GORE-TEX GRAFT ARM ( BRACHIAL AXILLARY ) (Right )  Patient Location: PACU  Anesthesia Type:General  Level of Consciousness: awake, drowsy and patient cooperative  Airway & Oxygen Therapy: Patient Spontanous Breathing and Patient connected to face mask oxygen  Post-op Assessment: Report given to RN and Post -op Vital signs reviewed and stable  Post vital signs: Reviewed and stable  Last Vitals:  Vitals Value Taken Time  BP 171/59 03/15/20 0951  Temp    Pulse    Resp 12 03/15/20 0956  SpO2    Vitals shown include unvalidated device data.  Last Pain:  Vitals:   03/15/20 4643  TempSrc: Tympanic  PainSc: 0-No pain         Complications: No apparent anesthesia complications, neo infusion transferred to PACU with pt.

## 2020-03-15 NOTE — Consult Note (Signed)
Reason for Consult: Altered mental status and hypotension after right brachial axillary arteriovenous graft placement Referring Physician: Hortencia Pilar, MD  [name] is an 69 y.o. female.  HPI: 69 year old former smoker with end-stage renal disease with dialysis Monday Wednesday Friday, presented today for right brachial axillary AV graft placement.  The procedure was done under axillary block and general anesthesia delivered via LMA.  The patient returned to spontaneous ventilation prior to return to PACU.  Upon return to PACU the patient was noted to be restless, not mentating as well and complaining of vague chest discomfort.  Patient remained hypotensive and required Neo-Synephrine infusion.  Because of these issues the patient is to be monitored in the ICU and vascular surgery has requested assistance in the management.  The patient is lethargic but arousable on evaluation and cannot provide any further history.  Initially it was believed that she had received dialysis the day prior however upon getting further history from the patient's sister via phone it is noted that the patient is actually on a Monday Wednesday Friday dialysis schedule and had missed dialysis today due to her procedure.  A repeat EKG in the PACU has shown some potential ischemic changes I have requested the cardiology also be consulted.  Laboratory data is being actualized.  Past Medical History:  Diagnosis Date  . Adenomatous polyp of colon 10/2010, 2006, 2015  . Anemia in CKD (chronic kidney disease) 11/07/2012   s/p blood transfusion.   . Arthritis   . CAD (coronary artery disease)    "something like that"  . CHF (congestive heart failure) ([place])   . Constipation   . Depression with anxiety   . Diverticula, colon   . 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  Had some early rejection then stable function for years,  then had slow decline of function and went back on hemodialysis in 2012.  Gets HD TTS schedule at [facility] on [name] still using L forearm AVF.     Marland Kitchen GERD (gastroesophageal reflux disease)   . GI bleed 2017   felt to be ischemic colitis, last colo 2015  . Headache   . Hyperlipidemia   . Hypertension   . Neurologic gait dysfunction   . Neuromuscular disorder (HCC)    neuropathy hand and legs  . Osteoporosis   . Pneumonia   . 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 Pulmonary edema 12/2019  . Stroke [facility]) 11/2015   TIA  . Weight loss, unintentional     Past Surgical History:  Procedure Laterality Date  . AV FISTULA PLACEMENT     for dialysis  . AV FISTULA PLACEMENT Left 11/22/2015   Procedure: ARTERIOVENOUS (AV) FISTULA CREATION-LEFT BRACHIOCEPHALIC;  Surgeon: Serafina Mitchell, MD;  Location: [place];  Service: Vascular;  Laterality: Left;  . BACK SURGERY    . CERVICAL FUSION    . CHOLECYSTECTOMY  12/02/2012   Procedure: LAPAROSCOPIC CHOLECYSTECTOMY WITH INTRAOPERATIVE CHOLANGIOGRAM;  Surgeon: Edward Jolly, MD;  Location: [place];  Service: General;  Laterality: N/A;  . EYE SURGERY Bilateral    cataract surgery  . EYE SURGERY Left 2019   laser  . HEMATOMA EVACUATION Left 12/24/2016   Procedure: EVACUATION HEMATOMA LEFT UPPER ARM;  Surgeon: Waynetta Sandy, MD;  Location: [place];  Service: Vascular;  Laterality: Left;  . I & D EXTREMITY Left 12/31/2016  Procedure: IRRIGATION AND DEBRIDEMENT EXTREMITY;  Surgeon: Angelia Mould, MD;  Location: [place];  Service: Vascular;  Laterality: Left;  . INSERTION OF DIALYSIS CATHETER Right 12/24/2016   Procedure: INSERTION OF DIALYSIS CATHETER;  Surgeon: Waynetta Sandy, MD;  Location: [place];  Service: Vascular;  Laterality: Right;  . INSERTION OF DIALYSIS CATHETER Right 02/04/2017   Procedure: INSERTION OF DIALYSIS CATHETER;  Surgeon: Waynetta Sandy, MD;  Location: [place];   Service: Vascular;  Laterality: Right;  . KIDNEY TRANSPLANT  1996  . PERIPHERAL VASCULAR CATHETERIZATION Left 10/23/2016   Procedure: Fistulagram;  Surgeon: Elam Dutch, MD;  Location: [place] CV LAB;  Service: Cardiovascular;  Laterality: Left;  . RESECTION OF ARTERIOVENOUS FISTULA ANEURYSM Left 11/22/2015   Procedure: RESECTION OF LEFT RADIOCEPHALIC FISTULA ANEURYSM ;  Surgeon: Serafina Mitchell, MD;  Location: [place];  Service: Vascular;  Laterality: Left;  . REVISON OF ARTERIOVENOUS FISTULA Left 12/22/2016   Procedure: REVISON OF LEFT ARTERIOVENOUS FISTULA;  Surgeon: Waynetta Sandy, MD;  Location: [place];  Service: Vascular;  Laterality: Left;  . REVISON OF ARTERIOVENOUS FISTULA Left 02/04/2017   Procedure: REVISON OF LEFT UPPER ARM ARTERIOVENOUS FISTULA;  Surgeon: Waynetta Sandy, MD;  Location: [employer];  Service: Vascular;  Laterality: Left;  . UPPER EXTREMITY ANGIOGRAPHY Bilateral 09/19/2019   Procedure: UPPER EXTREMITY ANGIOGRAPHY;  Surgeon: Katha Cabal, MD;  Location: [place] CV LAB;  Service: Cardiovascular;  Laterality: Bilateral;    Family History  Problem Relation Age of Onset  . Colon cancer Brother   . Cancer Brother   . Coronary artery disease Mother 53  . Hyperlipidemia Mother   . Hypertension Mother   . Stroke Maternal Aunt   . Esophageal cancer Neg Hx   . Stomach cancer Neg Hx   . Rectal cancer Neg Hx     Social History:  reports that she quit smoking about 28 years ago. Her smoking use included cigarettes. She has never used smokeless tobacco. She reports that she does not drink alcohol or use drugs.  Allergies:  Allergies  Allergen Reactions  . Sulfa Antibiotics Other (See Comments)    Both parents allergic-so will not take  . Adhesive [Tape] Itching    Medications: I have reviewed the patient's current medications.  Results for orders placed or performed during the hospital encounter of 03/15/20 (from the past 48 hour(s))   ABO/Rh     Status: None   Collection Time: 03/15/20  6:29 AM  Result Value Ref Range   ABO/RH(D)      O POS Performed at [facility], [address] [place], [place] [address]   I-STAT, [place] 8     Status: Abnormal   Collection Time: 03/15/20  6:32 AM  Result Value Ref Range   Sodium 136 135 - 145 mmol/L   Potassium 4.6 3.5 - 5.1 mmol/L   Chloride 99 98 - 111 mmol/L   BUN 37 (H) 8 - 23 mg/dL   Creatinine, Ser 7.90 (H) 0.44 - 1.00 mg/dL   Glucose, Bld 81 70 - 99 mg/dL    Comment: Glucose reference range applies only to samples taken after fasting for at least 8 hours.   Calcium, Ion 1.01 (L) 1.15 - 1.40 mmol/L   TCO2 29 22 - 32 mmol/L   Hemoglobin 11.6 (L) 12.0 - 15.0 g/dL   HCT 34.0 (L) 36.0 - 46.0 %    DG Chest Portable 1 View  Result Date: 03/15/2020 CLINICAL DATA:  Shortness of breath EXAM: PORTABLE CHEST 1 VIEW COMPARISON:  Portable exam 1102 hours compared to 01/01/2020 FINDINGS: Enlargement of cardiac silhouette with pulmonary vascular congestion. Atherosclerotic calcification aorta. Peribronchial thickening with accentuated interstitial markings likely representing pulmonary edema and CHF, slightly improved from previous study. No pleural effusion or pneumothorax. Bones demineralized with degenerative changes of the thoracic spine and probable chronic RIGHT rotator cuff tear. IMPRESSION: Enlargement of cardiac silhouette with vascular congestion and probable pulmonary edema, slightly improved. Electronically Signed   By: Lavonia Dana M.D.   On: 03/15/2020 11:10   [place] OR NERVE BLOCK-IMAGE ONLY [facility])  Result Date: 03/15/2020 There is no interpretation for this exam.  This order is for images obtained during a surgical procedure.  Please See "Surgeries" Tab for more information regarding the procedure.    Review of Systems  Unable to perform ROS: Mental status change  Acuity of condition.  Blood pressure (!) 84/58, pulse (!) 107, temperature 98.4 F (36.9 C), resp.  rate (!) 22, weight 50.8 kg, SpO2 (!) 75 %.   Physical exam: GENERAL: Frail-appearing, thin, lethargic but arousable, complains of chest discomfort. HEAD: Normocephalic, atraumatic.  EYES: Pupils equal, round, reactive to light.  No scleral icterus.  MOUTH: Oral mucosa moist, membranes pale. NECK: Supple. No thyromegaly. No nodules.  JVD present. PULMONARY: Lungs clear to auscultation bilaterally.  No adventitious sounds. CARDIOVASCULAR: S1 and S2.  Slightly tachy, regular rate.  Grade 3/6 harsh murmur at the left sternal border. GASTROINTESTINAL: Scaphoid abdomen. MUSCULOSKELETAL: No joint deformity, no clubbing, no edema.  Palpable thrill on axillary AV graft right brachial.  Old fistula on left without thrill NEUROLOGIC: Lethargic but arousable, terse responses.  No overt focal deficits. SKIN: Intact,warm,dry PSYCH: Cannot assess due to lethargy.  Assessment/Plan:  Hypotension postoperative Chest discomfort EKG changes Etiology could be multifactorial Appears to have some EKG changes concerning for ischemia Will obtain cardiology consultation Continue phenylephrine for now Challenge with volume She has very poor access due to multiple vascular interventions ICU monitoring Check cortisol and TSH levels  Cardiac murmur Possible ischemia on EKG Stat 2D echo Cardiology consultation, appreciate input Serial EKGs Trend troponins  ESRD on dialysis Refresh laboratory data Address electrolyte derangements if present Nephrology consultation for possible dialysis Notified Dr. Holley Raring   Discussed with Dr. Delana Meyer and Dr. Rockey Situ.  Awaiting laboratory data to further determine next steps.  Further recommendations pending results of the above, please see orders for details.  Renold Don, MD [place] [facility] 03/15/2020, 1:54 PM

## 2020-03-16 DIAGNOSIS — I9581 Postprocedural hypotension: Secondary | ICD-10-CM

## 2020-03-16 DIAGNOSIS — R079 Chest pain, unspecified: Secondary | ICD-10-CM

## 2020-03-16 DIAGNOSIS — I959 Hypotension, unspecified: Secondary | ICD-10-CM

## 2020-03-16 DIAGNOSIS — I471 Supraventricular tachycardia: Secondary | ICD-10-CM

## 2020-03-16 DIAGNOSIS — I426 Alcoholic cardiomyopathy: Secondary | ICD-10-CM

## 2020-03-16 DIAGNOSIS — J810 Acute pulmonary edema: Secondary | ICD-10-CM

## 2020-03-16 LAB — COMPREHENSIVE METABOLIC PANEL
ALT: 137 U/L — ABNORMAL HIGH (ref 0–44)
AST: 290 U/L — ABNORMAL HIGH (ref 15–41)
Albumin: 2.9 g/dL — ABNORMAL LOW (ref 3.5–5.0)
Alkaline Phosphatase: 132 U/L — ABNORMAL HIGH (ref 38–126)
Anion gap: 22 — ABNORMAL HIGH (ref 5–15)
BUN: 59 mg/dL — ABNORMAL HIGH (ref 8–23)
CO2: 14 mmol/L — ABNORMAL LOW (ref 22–32)
Calcium: 9.2 mg/dL (ref 8.9–10.3)
Chloride: 102 mmol/L (ref 98–111)
Creatinine, Ser: 7.93 mg/dL — ABNORMAL HIGH (ref 0.44–1.00)
GFR calc Af Amer: 5 mL/min — ABNORMAL LOW (ref >60)
GFR calc non Af Amer: 5 mL/min — ABNORMAL LOW (ref >60)
Glucose, Bld: 76 mg/dL (ref 70–99)
Potassium: 7.3 mmol/L (ref 3.5–5.1)
Sodium: 138 mmol/L (ref 135–145)
Total Bilirubin: 2 mg/dL — ABNORMAL HIGH (ref 0.3–1.2)
Total Protein: 6.3 g/dL — ABNORMAL LOW (ref 6.5–8.1)

## 2020-03-16 LAB — TYPE AND SCREEN
ABO/RH(D): O POS
Antibody Screen: POSITIVE
Unit division: 0
Unit division: 0

## 2020-03-16 LAB — CBC
HCT: 33.6 % — ABNORMAL LOW (ref 36.0–46.0)
Hemoglobin: 11.2 g/dL — ABNORMAL LOW (ref 12.0–15.0)
MCH: 30.9 pg (ref 26.0–34.0)
MCHC: 33.3 g/dL (ref 30.0–36.0)
MCV: 92.6 fL (ref 80.0–100.0)
Platelets: 191 10*3/uL (ref 150–400)
RBC: 3.63 MIL/uL — ABNORMAL LOW (ref 3.87–5.11)
RDW: 16.6 % — ABNORMAL HIGH (ref 11.5–15.5)
WBC: 17.4 10*3/uL — ABNORMAL HIGH (ref 4.0–10.5)
nRBC: 0 % (ref 0.0–0.2)

## 2020-03-16 LAB — BASIC METABOLIC PANEL
Anion gap: 18 — ABNORMAL HIGH (ref 5–15)
BUN: 20 mg/dL (ref 8–23)
CO2: 24 mmol/L (ref 22–32)
Calcium: 9.1 mg/dL (ref 8.9–10.3)
Chloride: 97 mmol/L — ABNORMAL LOW (ref 98–111)
Creatinine, Ser: 3.11 mg/dL — ABNORMAL HIGH (ref 0.44–1.00)
GFR calc Af Amer: 17 mL/min — ABNORMAL LOW (ref >60)
GFR calc non Af Amer: 15 mL/min — ABNORMAL LOW (ref >60)
Glucose, Bld: 93 mg/dL (ref 70–99)
Potassium: 3.8 mmol/L (ref 3.5–5.1)
Sodium: 139 mmol/L (ref 135–145)

## 2020-03-16 LAB — LACTIC ACID, PLASMA
Lactic Acid, Venous: 9.8 mmol/L (ref 0.5–1.9)
Lactic Acid, Venous: 2.8 mmol/L (ref 0.5–1.9)

## 2020-03-16 LAB — BPAM RBC
Blood Product Expiration Date: 202104282359
Blood Product Expiration Date: 202104282359
Unit Type and Rh: 5100
Unit Type and Rh: 5100

## 2020-03-16 LAB — TROPONIN I (HIGH SENSITIVITY): Troponin I (High Sensitivity): 536 ng/L (ref <18)

## 2020-03-16 LAB — MAGNESIUM: Magnesium: 2.5 mg/dL — ABNORMAL HIGH (ref 1.7–2.4)

## 2020-03-16 LAB — PHOSPHORUS: Phosphorus: 3.8 mg/dL (ref 2.5–4.6)

## 2020-03-16 LAB — OCCULT BLOOD X 1 CARD TO LAB, STOOL: Fecal Occult Bld: NEGATIVE

## 2020-03-16 MED ORDER — LORATADINE 10 MG PO TABS
10.0000 mg | ORAL_TABLET | Freq: Every day | ORAL | Status: DC
  Administered 2020-03-16 – 2020-03-18 (×3): 10 mg via ORAL
  Filled 2020-03-16 (×2): qty 1

## 2020-03-16 MED ORDER — SODIUM CHLORIDE 0.9 % IV SOLN: 100.0000 mL | INTRAVENOUS | Status: DC | PRN

## 2020-03-16 MED ORDER — SALINE SPRAY 0.65 % NA SOLN
1.0000 | NASAL | Status: DC | PRN
  Filled 2020-03-16: qty 44

## 2020-03-16 MED ORDER — CHLORHEXIDINE GLUCONATE CLOTH 2 % EX PADS
6.0000 | MEDICATED_PAD | Freq: Every day | CUTANEOUS | Status: DC
  Administered 2020-03-16 – 2020-03-18 (×3): 6 via TOPICAL

## 2020-03-16 MED ORDER — PENTAFLUOROPROP-TETRAFLUOROETH EX AERO
1.0000 "application " | INHALATION_SPRAY | CUTANEOUS | Status: DC | PRN
  Filled 2020-03-16: qty 30

## 2020-03-16 MED ORDER — LIDOCAINE HCL (PF) 1 % IJ SOLN
5.0000 mL | INTRAMUSCULAR | Status: DC | PRN
  Filled 2020-03-16: qty 5

## 2020-03-16 MED ORDER — HYDROXYZINE HCL 10 MG PO TABS
10.0000 mg | ORAL_TABLET | Freq: Three times a day (TID) | ORAL | Status: DC | PRN
  Administered 2020-03-16: 10 mg via ORAL
  Filled 2020-03-16 (×2): qty 1

## 2020-03-16 MED ORDER — LABETALOL HCL 100 MG PO TABS
100.0000 mg | ORAL_TABLET | Freq: Two times a day (BID) | ORAL | Status: DC
  Administered 2020-03-16 – 2020-03-18 (×4): 100 mg via ORAL
  Filled 2020-03-16 (×6): qty 1

## 2020-03-16 NOTE — Progress Notes (Addendum)
Treatment initiated at 2303 and completed at 0208. Tolerated well remain alert and orietend, hD treatment receved via LUA fistula, moments of accelerated heart rate where UF was stoppped, ended taking 1161ml removed, homeostasis obtained at LUA fistula site, clean dry dressing applied, Primary Rn given report

## 2020-03-16 NOTE — Progress Notes (Addendum)
Updated sister on the events of her code/rapid response call.  Patient was on the bedpan and having a seizure upon walking in the room at 2210.  Dialysis RN Lorre Munroe noticed when heart rate went from 60-70's to 140-160's.  Patient was in VT and unresponsive.  Patient was shocked into a Sinus Arrhythmia. Her gaze fixed to the left.  She did come around and had another seizure.  Both only lasting about 3 to 5 minutes each.    Patient placed on Bipap versus intubation; never lost a pulse; given calcium gluconate to counter act K+ 6.1.  Insulin was held and patient started on D5 + 1/2 NS IVF due to limited access to push an amp of D50.    Access of a line needed brought up to MD's.  Neo was on hold immediately when seizure was started.  Informed the sister of most of the above events and had her talk to sister on the phone even though patient was on Bipap and could speak back clearly.  Answered all the family questions at that time.

## 2020-03-16 NOTE — Progress Notes (Signed)
Updated sister on patient arriving to ICU from PACU.  Discussed dialysis and some admission questions clarified.  Gave my number [phone] to contact throughout the night.

## 2020-03-16 NOTE — Progress Notes (Addendum)
eLink Physician-Brief Progress Note Patient Name: [name] DOB: [birth year] MRN: [id number]   Date of Service  03/16/2020  HPI/Events of Note  I was notified now of 11/20 pm labs. K is 7.3 but these were before HD was started. Seen on camera. Awake and alert on BIPAP, o2 sat 100 on 60% fio2. Is getting HD via her fistula and is planned on finishing HD in 45 min. Also not on any pressors, no chest pain, no belly pain. Lactate noted.   eICU Interventions  Repeat lactate and BMP at 3 am Nephrology is following and managing her hyperkalemia, will defer potassium management to them as already getting HD Please call us back with labs I am told the team is working on additional IV access, may need a central line (bedside team has also been coordinating this)     Intervention Category Major Interventions: Electrolyte abnormality - evaluation and management  Nuala Chiles G Vernadine Coombs 03/16/2020, 1:27 AM   Addendum - Repeat K is 3.8, lactate down to 2.8 Defer further care to day team, labs have improved

## 2020-03-16 NOTE — Anesthesia Postprocedure Evaluation (Signed)
Anesthesia Post Note  Patient: KELIS PLASSE  Procedure(s) Performed: INSERTION OF ARTERIOVENOUS (AV) GORE-TEX GRAFT ARM ( BRACHIAL AXILLARY ) (Right )  Patient location during evaluation: SICU Anesthesia Type: General Level of consciousness: awake and alert Pain management: pain level controlled Vital Signs Assessment: post-procedure vital signs reviewed and stable Respiratory status: spontaneous breathing Cardiovascular status: stable Postop Assessment: no apparent nausea or vomiting Anesthetic complications: no Comments: Patient had an eventful night involving an MI in the ICU as well as it was found that her potassium post-op was 6.1.  While receiving dialysis she went into V-tach with a pulse and had to be cardioverted.  V-tach was broken, but patient suspected of also having a seizure.  Was about to be intubated, but seizure resolved.  This morning the patient looks much improved and will be transferred to the floor.     Last Vitals:  Vitals:   03/16/20 1400 03/16/20 1414  BP:  (!) 115/35  Pulse: 83 73  Resp: 17 19  Temp:  36.9 C  SpO2: 91% 100%    Last Pain:  Vitals:   03/16/20 1414  TempSrc: Oral  PainSc:                  Martha Clan

## 2020-03-16 NOTE — Progress Notes (Signed)
RT x2 responded to Code Blue. Patient became responsive prior to intubation, Ventilator on standby in patient room. Patient is able to follow commands, maintain SATs, and breathe adequately on her own, and was subsequently placed on Bipap.  ABG was ordered during CODE, however, patient has bilateral limb restrictions. Per MD verbal, ABG to be changed to VBG. Will continue to monitor.

## 2020-03-16 NOTE — Progress Notes (Signed)
Progress Note  Patient Name: [name] Date of Encounter: 03/16/2020  Primary Cardiologist: Agbor-Etang  Subjective   Upon arriving to the ICU on 4/2, she was junctional. Prior to undergoing HD, her potassium continued to trend up to 7.3. She developed sustained VT requiring CPR and successful DCCV. With HD, potassium is improved to 3.8 this morning. Her chest pain resolved with HD. Echo with low normal LVSF with features possibly consistent with stress-induced cardiomyopathy. HS-Tn has trended to 536 in the setting of the above.   Inpatient Medications    Scheduled Meds: . allopurinol  150 mg Oral QHS  . alum & mag hydroxide-simeth  30 mL Oral Once  . Chlorhexidine Gluconate Cloth  6 each Topical Q0600  . cinacalcet  30 mg Oral Daily  . insulin aspart  5 Units Intravenous Once   And  . dextrose  1 ampule Intravenous Once  . dextrose  50 mL Intravenous Once  . Dialyvite 800  1 Wafer Oral Daily  . docusate sodium  100 mg Oral BID  . ferric citrate  420 mg Oral TID WC  . heparin  5,000 Units Subcutaneous Q8H  . insulin aspart  10 Units Intravenous Once  . simvastatin  20 mg Oral QHS   Continuous Infusions: . sodium chloride    . sodium chloride    . dextrose 5 % and 0.45% NaCl 40 mL/hr at 03/16/20 0832  . phenylephrine (NEO-SYNEPHRINE) Adult infusion Stopped (03/15/20 2210)   PRN Meds: sodium chloride, sodium chloride, acetaminophen **OR** acetaminophen, HYDROcodone-acetaminophen, lactulose, lidocaine (PF), ondansetron, pentafluoroprop-tetrafluoroeth, polyethylene glycol   Vital Signs    Vitals:   03/16/20 0645 03/16/20 0700 03/16/20 0800 03/16/20 0840  BP: (!) 129/49 (!) 114/45 (!) 122/51   Pulse: 82 78 88   Resp: 12 12 14 13   Temp:    98.4 F (36.9 C)  TempSrc:    Axillary  SpO2: 99% 98% 94%   Weight:      Height:        Intake/Output Summary (Last 24 hours) at 03/16/2020 0934 Last data filed at 03/16/2020 6063 Gross per 24 hour  Intake 2911.4 ml    Output 1162 ml  Net 1749.4 ml   Filed Weights   03/15/20 0621 03/15/20 2023 03/16/20 0412  Weight: 50.8 kg 54.5 kg 53 kg    Telemetry    Junctional rhythm with sustained VT, currently SR - Personally Reviewed  ECG     - Personally Reviewed  Physical Exam   GEN: No acute distress. Frail appearing.  Neck: No JVD. Cardiac: RRR, II/VI systolic murmurs, no rubs, or gallops.  Respiratory: Clear to auscultation bilaterally.  GI: Soft, nontender, non-distended.   MS: No edema; No deformity. Neuro:  Alert and oriented x 3; Nonfocal.  Psych: Normal affect.  Labs    Chemistry Recent Labs  Lab 03/15/20 1348 03/15/20 2320 03/16/20 0545  NA 140 138 139  K 6.1* 7.3* 3.8  CL 98 102 97*  CO2 22 14* 24  GLUCOSE 144* 76 93  BUN 46* 59* 20  CREATININE 7.81* 7.93* 3.11*  CALCIUM 9.4 9.2 9.1  PROT  --  6.3*  --   ALBUMIN  --  2.9*  --   AST  --  290*  --   ALT  --  137*  --   ALKPHOS  --  132*  --   BILITOT  --  2.0*  --   GFRNONAA 5* 5* 15*  GFRAA 6* 5* 17*  ANIONGAP 20* 22* 18*     Hematology Recent Labs  Lab 03/15/20 1348 03/15/20 2320 03/16/20 0545  WBC 8.1 20.0* 17.4*  RBC 3.55* 3.67* 3.63*  HGB 10.7* 11.2* 11.2*  HCT 34.7* 34.9* 33.6*  MCV 97.7 95.1 92.6  MCH 30.1 30.5 30.9  MCHC 30.8 32.1 33.3  RDW 16.8* 17.0* 16.6*  PLT 266 219 191    Cardiac EnzymesNo results for input(s): TROPONINI in the last 168 hours. No results for input(s): TROPIPOC in the last 168 hours.   BNPNo results for input(s): BNP, PROBNP in the last 168 hours.   DDimer No results for input(s): DDIMER in the last 168 hours.   Radiology    DG Chest Portable 1 View  Result Date: 03/15/2020 IMPRESSION: Enlargement of cardiac silhouette with vascular congestion and probable pulmonary edema, slightly improved. Electronically Signed   By: Lavonia Dana M.D.   On: 03/15/2020 11:10    Cardiac Studies   2D echo 03/15/2020: 1. Left ventricular ejection fraction, by estimation, is 50 to 55%.  The  left ventricle has low normal function. The left ventricle demonstrates  regional wall motion abnormalities (distal anteroseptal and apical region,  concerning for condution abnormality). There is moderate left ventricular hypertrophy. Left ventricular diastolic parameters are consistent with Grade I diastolic dysfunction (impaired relaxation).  2. Right ventricular systolic function is normal. The right ventricular  size is normal. There is severely elevated pulmonary artery systolic  pressure. The estimated right ventricular systolic pressure is 27.0 mmHg.  3. Left atrial size was moderately dilated.  4. Right atrial size was mildly dilated.  5. Tricuspid valve regurgitation is moderate to severe.  6. The inferior vena cava is dilated in size with <50% respiratory  variability, suggesting right atrial pressure of 15 mmHg. __________  Carlton Adam MPI 02/20/2020:  Nuclear stress EF: 69%.  There was no ST segment deviation noted during stress.  The study is normal.  This is a low risk study.  The left ventricular ejection fraction is hyperdynamic (>65%). __________  2D echo 01/01/2020: 1. Left ventricular ejection fraction, by visual estimation, is 60 to  65%. The left ventricle has normal function. There is mildly increased  left ventricular hypertrophy.  2. Left ventricular diastolic parameters are consistent with Grade II  diastolic dysfunction (pseudonormalization).  3. The left ventricle has no regional wall motion abnormalities.  4. Global right ventricle has normal systolic function.The right  ventricular size is normal. No increase in right ventricular wall  thickness.  5. Left atrial size was mildly dilated.  6. Right atrial size was mildly dilated.  7. Mild mitral annular calcification.  8. The mitral valve is normal in structure. Mild mitral valve  regurgitation. No evidence of mitral stenosis.  9. The tricuspid valve is normal in structure. Tricuspid  valve  regurgitation is moderate.  10. The aortic valve is tricuspid. Aortic valve regurgitation is not  visualized. Mild aortic valve sclerosis without stenosis.  11. The tricuspid regurgitant velocity is 3.66 m/s, and with an assumed  right atrial pressure of 3 mmHg, the estimated right ventricular systolic  pressure is moderately elevated at 56.6 mmHg.  12. The inferior vena cava is normal in size with greater than 50%  respiratory variability, suggesting right atrial pressure of 3 mmHg.   Patient Profile     69 y.o. female with history of ESRD status post renal transplant from 1996-2012 on HD since, pulmonary hypertension, TIA, HTN, HLD who is being seen today for the evaluation of chest  pain.  Assessment & Plan    1. Sustained VT: -Status post successful DCCV overnight -Likely in the setting of hyperkalemia -Resolved -Would monitor for another 24 hours -With relative hypotension associated with HD, unable to add beta blocker -Recommend goal potassium of 4.0, no indication for amiodarone at this time given this occurred with missed HD and hyperkalemia  -Echo as above  2. Junctional rhythm: -Preceding VT -Likely secondary to electrolyte abnormality  -Improved with HD  3. Elevated troponin: -Chest pain resolved following HD -HS-Tn of 536, likely in the setting of her comorbid conditions including ESRD and VT -Echo with low normal EF -With documented hyperkalemia and recent nonischemic Myoview less than 1 month prior, no plan for inpatient ischemic work up at this time  For questions or updates, please contact [place] Please consult [email or web address] for contact info under Cardiology/STEMI.    Signed, Christell Faith, PA-C [place] Pager: [phone] 03/16/2020, 9:34 AM

## 2020-03-16 NOTE — Care Plan (Signed)
Patient is awake and alert morning. Events of last night noted. Patient had seen VT related to hyperkalemia. She was dialyzed last night and her issues have now resolved. Her previously noted chest discomfort has also resolved. He is not requiring BiPAP or supplemental oxygen. She is awake alert oriented and fully cognizant of the events of last night.  Hyperkalemia likely due to missed dialysis and effects of anesthesia. The patient's dialysis days are actually Monday Wednesday Friday. Patient has not had any further arrhythmia since potassium corrected.  Discussed with Dr. Rockey Situ from our standpoint the patient may be transferred to telemetry.  Renold Don, MD [place] [facility]

## 2020-03-16 NOTE — Progress Notes (Signed)
[facility]  ROUNDING NOTE   Subjective:   Hemodialysis treatment last night. Patient had 2 hours of treatment before developing SVT and then ventricular tachycardia. Dialysis was stopped.   Patient is pleasant this morning with no complaints.   Objective:  Vital signs in last 24 hours:  Temp:  [97 F (36.1 C)-98.4 F (36.9 C)] 98.4 F (36.9 C) (04/03 0840) Pulse Rate:  [55-143] 81 (04/03 1036) Resp:  [9-33] 20 (04/03 1036) BP: (70-164)/(18-127) 154/73 (04/03 0900) SpO2:  [70 %-100 %] 99 % (04/03 1036) Weight:  [53 kg-54.5 kg] 53 kg (04/03 0412)  Weight change: 3.697 kg Filed Weights   03/15/20 0621 03/15/20 2023 03/16/20 0412  Weight: 50.8 kg 54.5 kg 53 kg    Intake/Output: I/O last 3 completed shifts: In: 2845.3 [P.O.:90; I.V.:2508.3; IV Piggyback:247] Out: 1162 [Other:1150; Stool:2; Blood:10]   Intake/Output this shift:  Total I/O In: 102.6 [I.V.:102.6] Out: -   Physical Exam: General: NAD, laying in bed  Head: Normocephalic, atraumatic. Moist oral mucosal membranes  Eyes: Anicteric, PERRL  Neck: Supple, trachea midline  Lungs:  Clear to auscultation  Heart: Regular rate and rhythm  Abdomen:  Soft, nontender,   Extremities: no peripheral edema.  Neurologic: Nonfocal, moving all four extremities  Skin: No lesions  Access: Left AVF + aneurysmal. Right AVG maturing.     Basic Metabolic Panel: Recent Labs  Lab 03/14/20 0925 03/14/20 0925 03/15/20 0632 03/15/20 1348 03/15/20 2320 03/16/20 0545  NA 140  --  136 140 138 139  K 4.4  --  4.6 6.1* 7.3* 3.8  CL 92*  --  99 98 102 97*  CO2 30  --   --  22 14* 24  GLUCOSE 78  --  81 144* 76 93  BUN 24*  --  37* 46* 59* 20  CREATININE 5.23*  --  7.90* 7.81* 7.93* 3.11*  CALCIUM 9.8   < >  --  9.4 9.2 9.1  MG  --   --   --   --  2.5*  --   PHOS  --   --   --   --   --  3.8   < > = values in this interval not displayed.    Liver Function Tests: Recent Labs  Lab 03/15/20 2320  AST 290*   ALT 137*  ALKPHOS 132*  BILITOT 2.0*  PROT 6.3*  ALBUMIN 2.9*   No results for input(s): LIPASE, AMYLASE in the last 168 hours. No results for input(s): AMMONIA in the last 168 hours.  CBC: Recent Labs  Lab 03/14/20 0925 03/15/20 0632 03/15/20 1348 03/15/20 2320 03/16/20 0545  WBC 5.4  --  8.1 20.0* 17.4*  NEUTROABS 2.7  --   --   --   --   HGB 11.9* 11.6* 10.7* 11.2* 11.2*  HCT 37.4 34.0* 34.7* 34.9* 33.6*  MCV 94.4  --  97.7 95.1 92.6  PLT 247  --  266 219 191    Cardiac Enzymes: No results for input(s): CKTOTAL, CKMB, CKMBINDEX, TROPONINI in the last 168 hours.  BNP: Invalid input(s): POCBNP  CBG: Recent Labs  Lab 03/15/20 1949 03/15/20 2238 03/15/20 2331  GLUCAP 125* 69* 85    Microbiology: Results for orders placed or performed during the hospital encounter of 03/15/20  MRSA PCR Screening     Status: None   Collection Time: 03/15/20  8:34 PM   Specimen: Nasal Mucosa; Nasopharyngeal  Result Value Ref Range Status   MRSA by PCR  NEGATIVE NEGATIVE Final    Comment:        The GeneXpert MRSA Assay (FDA approved for NASAL specimens only), is one component of a comprehensive MRSA colonization surveillance program. It is not intended to diagnose MRSA infection nor to guide or monitor treatment for MRSA infections. Performed at [facility], [address] [place], [place] [address]     Coagulation Studies: Recent Labs    03/14/20 0925  LABPROT 13.8  INR 1.1    Urinalysis: No results for input(s): COLORURINE, LABSPEC, PHURINE, GLUCOSEU, HGBUR, BILIRUBINUR, KETONESUR, PROTEINUR, UROBILINOGEN, NITRITE, LEUKOCYTESUR in the last 72 hours.  Invalid input(s): APPERANCEUR    Imaging: DG Chest Portable 1 View  Result Date: 03/15/2020 CLINICAL DATA:  Shortness of breath EXAM: PORTABLE CHEST 1 VIEW COMPARISON:  Portable exam 1102 hours compared to 01/01/2020 FINDINGS: Enlargement of cardiac silhouette with pulmonary vascular congestion.  Atherosclerotic calcification aorta. Peribronchial thickening with accentuated interstitial markings likely representing pulmonary edema and CHF, slightly improved from previous study. No pleural effusion or pneumothorax. Bones demineralized with degenerative changes of the thoracic spine and probable chronic RIGHT rotator cuff tear. IMPRESSION: Enlargement of cardiac silhouette with vascular congestion and probable pulmonary edema, slightly improved. Electronically Signed   By: Ulyses Southward M.D.   On: 03/15/2020 11:10   [place] OR NERVE BLOCK-IMAGE ONLY [facility])  Result Date: 03/15/2020 There is no interpretation for this exam.  This order is for images obtained during a surgical procedure.  Please See "Surgeries" Tab for more information regarding the procedure.   ECHOCARDIOGRAM LIMITED  Result Date: 03/15/2020    ECHOCARDIOGRAM LIMITED REPORT   Patient Name:   [name] Date of Exam: 03/15/2020 Medical Rec #:  [id number]         Height:       62.0 in Accession #:    [id number]        Weight:       112.0 lb Date of Birth:  02-02-1951         BSA:          1.494 m Patient Age:    69 years          BP:           119/55 mmHg Patient Gender: F                 HR:           62 bpm. Exam Location:  [facility] Procedure: 2D Echo, Cardiac Doppler and Color Doppler Indications:     Acute Respiratory Insufficiency  History:         Patient has prior history of Echocardiogram examinations. CHF,                  CAD; Risk Factors:Hypertension.  Sonographer:     Neysa Bonito Roar Referring Phys:  [id number] CARMEN Knox Saliva Diagnosing Phys: Julien Nordmann MD IMPRESSIONS  1. Left ventricular ejection fraction, by estimation, is 50 to 55%. The left ventricle has low normal function. The left ventricle demonstrates regional wall motion abnormalities (distal anteroseptal and apical region, concerning for condution abnormality). There is moderate left ventricular hypertrophy. Left ventricular diastolic parameters are consistent with Grade I  diastolic dysfunction (impaired relaxation).  2. Right ventricular systolic function is normal. The right ventricular size is normal. There is severely elevated pulmonary artery systolic pressure. The estimated right ventricular systolic pressure is 74.6 mmHg.  3. Left atrial size was moderately dilated.  4. Right atrial size was  mildly dilated.  5. Tricuspid valve regurgitation is moderate to severe.  6. The inferior vena cava is dilated in size with <50% respiratory variability, suggesting right atrial pressure of 15 mmHg. FINDINGS  Left Ventricle: Left ventricular ejection fraction, by estimation, is 50 to 55%. The left ventricle has low normal function. The left ventricle demonstrates regional wall motion abnormalities. The left ventricular internal cavity size was normal in size. There is moderate left ventricular hypertrophy. Right Ventricle: The right ventricular size is normal. No increase in right ventricular wall thickness. Right ventricular systolic function is normal. There is severely elevated pulmonary artery systolic pressure. The tricuspid regurgitant velocity is 3.86 m/s, and with an assumed right atrial pressure of 15 mmHg, the estimated right ventricular systolic pressure is 74.6 mmHg. Left Atrium: Left atrial size was moderately dilated. Right Atrium: Right atrial size was mildly dilated. Pericardium: There is no evidence of pericardial effusion. Mitral Valve: The mitral valve is normal in structure. Normal mobility of the mitral valve leaflets. No evidence of mitral valve stenosis. Tricuspid Valve: The tricuspid valve is normal in structure. Tricuspid valve regurgitation is moderate to severe. No evidence of tricuspid stenosis. Aortic Valve: The aortic valve is normal in structure. Aortic valve regurgitation is not visualized. Mild to moderate aortic valve sclerosis/calcification is present, without any evidence of aortic stenosis. Aortic valve mean gradient measures 7.3 mmHg. Aortic valve peak  gradient measures 13.9 mmHg. Aortic valve area, by VTI measures 2.26 cm. Pulmonic Valve: The pulmonic valve was normal in structure. Pulmonic valve regurgitation is not visualized. No evidence of pulmonic stenosis. Aorta: The aortic root is normal in size and structure. Venous: The inferior vena cava is dilated in size with less than 50% respiratory variability, suggesting right atrial pressure of 15 mmHg. IAS/Shunts: No atrial level shunt detected by color flow Doppler.  LEFT VENTRICLE PLAX 2D LVIDd:         3.49 cm  Diastology LVIDs:         2.42 cm  LV e' lateral:   7.29 cm/s LV PW:         1.39 cm  LV E/e' lateral: 11.5 LV IVS:        1.54 cm  LV e' medial:    5.44 cm/s LVOT diam:     1.80 cm  LV E/e' medial:  15.4 LV SV:         86 LV SV Index:   58 LVOT Area:     2.54 cm  RIGHT VENTRICLE RV Mid diam:    4.29 cm RV S prime:     12.50 cm/s TAPSE (M-mode): 2.4 cm LEFT ATRIUM             Index       RIGHT ATRIUM           Index LA diam:        3.30 cm 2.21 cm/m  RA Area:     23.10 cm LA Vol (A2C):   82.5 ml 55.21 ml/m RA Volume:   77.40 ml  51.80 ml/m LA Vol (A4C):   43.3 ml 28.98 ml/m LA Biplane Vol: 60.5 ml 40.49 ml/m  AORTIC VALVE                    PULMONIC VALVE AV Area (Vmax):    1.99 cm     PV Vmax:        0.86 m/s AV Area (Vmean):   2.02 cm     PV Peak  grad:   2.9 mmHg AV Area (VTI):     2.26 cm     RVOT Peak grad: 1 mmHg AV Vmax:           186.33 cm/s AV Vmean:          124.667 cm/s AV VTI:            0.380 m AV Peak Grad:      13.9 mmHg AV Mean Grad:      7.3 mmHg LVOT Vmax:         146.00 cm/s LVOT Vmean:        99.000 cm/s LVOT VTI:          0.338 m LVOT/AV VTI ratio: 0.89  AORTA Ao Root diam: 2.50 cm MITRAL VALVE               TRICUSPID VALVE MV Area (PHT): 2.74 cm    TR Peak grad:   59.6 mmHg MV Decel Time: 277 msec    TR Vmax:        386.00 cm/s MV E velocity: 83.90 cm/s MV A velocity: 98.50 cm/s  SHUNTS MV E/A ratio:  0.85        Systemic VTI:  0.34 m                            Systemic  Diam: 1.80 cm Julien Nordmann MD Electronically signed by Julien Nordmann MD Signature Date/Time: 03/15/2020/5:03:50 PM    Final      Medications:    . allopurinol  150 mg Oral QHS  . alum & mag hydroxide-simeth  30 mL Oral Once  . Chlorhexidine Gluconate Cloth  6 each Topical Q0600  . cinacalcet  30 mg Oral Daily  . insulin aspart  5 Units Intravenous Once   And  . dextrose  1 ampule Intravenous Once  . dextrose  50 mL Intravenous Once  . Dialyvite 800  1 Wafer Oral Daily  . docusate sodium  100 mg Oral BID  . ferric citrate  420 mg Oral TID WC  . heparin  5,000 Units Subcutaneous Q8H  . insulin aspart  10 Units Intravenous Once  . loratadine  10 mg Oral Daily  . simvastatin  20 mg Oral QHS   acetaminophen **OR** acetaminophen, HYDROcodone-acetaminophen, lactulose, lidocaine (PF), ondansetron, pentafluoroprop-tetrafluoroeth, polyethylene glycol, sodium chloride  Assessment/ Plan:  [name] is a 69 y.o. black female with end stage renal disease on hemodialysis, hypertension, anemia, hyperlipidemia, CVA, GERD, congestive heart failure, coronary artery disease, depression who was admitted to [facility] on 03/15/2020 for Change in mental state [R41.82] Ventricular tachycardia [facility]) [I47.2]  [facility]) [facility] MWF Left AVF 49kg  1. End Stage Renal Disease with hyperkalemia: hemodialysis treatment last night. Did not tolerate treatment well. No indication for dialysis today. Next treatment scheduled for Monday.   2. Hypertension: 154/73. Home regimen of losartan and labetalol.  Holding both agents.  Continue hold losartan due to hyperkalemia - Recommend restarting labetalol.   3. Anemia with chronic kidney disease: hemoglobin 11.2. ESA as outpatient.   4. Secondary Hyperparathyroidism: Calcium and phosphorus at goal. Outpatient labs PTH 500 on 3/3.  - cinacalcet - Auryxia with meals.    LOS: 1 Lyndall Bellot 4/3/202110:52 AM

## 2020-03-16 NOTE — Progress Notes (Signed)
Updated the sister and wants MD to call her today.  Answered all questions at this time.

## 2020-03-16 NOTE — Plan of Care (Signed)
Discussed with patient plan of care for the evening, pain management and nausea medications with some teach back displayed

## 2020-03-17 DIAGNOSIS — Z992 Dependence on renal dialysis: Secondary | ICD-10-CM

## 2020-03-17 LAB — POTASSIUM: Potassium: 5.4 mmol/L — ABNORMAL HIGH (ref 3.5–5.1)

## 2020-03-17 LAB — CBC
HCT: 30 % — ABNORMAL LOW (ref 36.0–46.0)
Hemoglobin: 9.4 g/dL — ABNORMAL LOW (ref 12.0–15.0)
MCH: 29.9 pg (ref 26.0–34.0)
MCHC: 31.3 g/dL (ref 30.0–36.0)
MCV: 95.5 fL (ref 80.0–100.0)
Platelets: 150 10*3/uL (ref 150–400)
RBC: 3.14 MIL/uL — ABNORMAL LOW (ref 3.87–5.11)
RDW: 16.8 % — ABNORMAL HIGH (ref 11.5–15.5)
WBC: 6.9 10*3/uL (ref 4.0–10.5)
nRBC: 0 % (ref 0.0–0.2)

## 2020-03-17 LAB — BASIC METABOLIC PANEL
Anion gap: 14 (ref 5–15)
BUN: 57 mg/dL — ABNORMAL HIGH (ref 8–23)
CO2: 25 mmol/L (ref 22–32)
Calcium: 8.3 mg/dL — ABNORMAL LOW (ref 8.9–10.3)
Chloride: 92 mmol/L — ABNORMAL LOW (ref 98–111)
Creatinine, Ser: 6.3 mg/dL — ABNORMAL HIGH (ref 0.44–1.00)
GFR calc Af Amer: 7 mL/min — ABNORMAL LOW (ref >60)
GFR calc non Af Amer: 6 mL/min — ABNORMAL LOW (ref >60)
Glucose, Bld: 92 mg/dL (ref 70–99)
Potassium: 4.7 mmol/L (ref 3.5–5.1)
Sodium: 131 mmol/L — ABNORMAL LOW (ref 135–145)

## 2020-03-17 LAB — PHOSPHORUS: Phosphorus: 4.7 mg/dL — ABNORMAL HIGH (ref 2.5–4.6)

## 2020-03-17 MED ORDER — SODIUM CHLORIDE 0.9% FLUSH
3.0000 mL | Freq: Two times a day (BID) | INTRAVENOUS | Status: DC
  Administered 2020-03-18 (×2): 3 mL via INTRAVENOUS

## 2020-03-17 MED ORDER — EPOETIN ALFA 10000 UNIT/ML IJ SOLN
10000.0000 [IU] | INTRAMUSCULAR | Status: DC
  Administered 2020-03-18: 10000 [IU] via INTRAVENOUS
  Filled 2020-03-17: qty 1

## 2020-03-17 MED ORDER — TRAZODONE HCL 50 MG PO TABS
50.0000 mg | ORAL_TABLET | Freq: Every evening | ORAL | Status: DC | PRN
  Administered 2020-03-17 (×2): 50 mg via ORAL
  Filled 2020-03-17 (×2): qty 1

## 2020-03-17 NOTE — Progress Notes (Signed)
    Subjective  - POD #2   States her right arm is a little sore   Physical Exam:  Right arm dialysis graft with good thrill.  Incisions are intact.  She has no evidence of steal syndrome.       Assessment/Plan:  POD #2  Chest tightness and hypertension postoperative, has resolved.  The patient had ventricular tachycardia requiring cardioversion, secondary to hyperkalemia.  Appreciate cardiology assistance.  Neurologic issues appear to be related to volume status and electrolyte abnormalities.  These have resolved.  Appreciate hospitalist assistance with this very complicated patient.  Wells Tami Blass 03/17/2020 3:50 PM --  Vitals:   03/17/20 0817 03/17/20 1222  BP: (!) 109/31 (!) 119/31  Pulse: 76 71  Resp: 16 15  Temp: 98.2 F (36.8 C) 98.3 F (36.8 C)  SpO2: 91% 97%   No intake or output data in the 24 hours ending 03/17/20 1550   Laboratory CBC    Component Value Date/Time   WBC 6.9 03/17/2020 0531   HGB 9.4 (L) 03/17/2020 0531   HGB 10.6 (L) 07/13/2018 0814   HGB 8.7 (L) 09/24/2011 1038   HCT 30.0 (L) 03/17/2020 0531   HCT 33.0 (L) 07/13/2018 0814   HCT 27.3 (L) 09/24/2011 1038   PLT 150 03/17/2020 0531   PLT 203 07/13/2018 0814    BMET    Component Value Date/Time   NA 131 (L) 03/17/2020 0531   NA 140 07/13/2018 0814   K 4.7 03/17/2020 0531   CL 92 (L) 03/17/2020 0531   CO2 25 03/17/2020 0531   GLUCOSE 92 03/17/2020 0531   BUN 57 (H) 03/17/2020 0531   BUN 45 (H) 07/13/2018 0814   CREATININE 6.30 (H) 03/17/2020 0531   CALCIUM 8.3 (L) 03/17/2020 0531   CALCIUM 9.6 08/03/2011 1005   GFRNONAA 6 (L) 03/17/2020 0531   GFRAA 7 (L) 03/17/2020 0531    COAG Lab Results  Component Value Date   INR 1.1 03/14/2020   INR 1.10 02/23/2017   INR 1.13 11/19/2015   No results found for: PTT  Antibiotics Anti-infectives (From admission, onward)   Start     Dose/Rate Route Frequency Ordered Stop   03/15/20 0615  ceFAZolin (ANCEF) IVPB 1 g/50 mL  premix     1 g 100 mL/hr over 30 Minutes Intravenous On call to O.R. 03/15/20 0612 03/15/20 0757   03/15/20 8177  ceFAZolin (ANCEF) 1-4 GM/50ML-% IVPB    Note to Pharmacy: Lyman Bishop   : cabinet override      03/15/20 0614 03/15/20 0850       V. Leia Alf, M.D., [facility] [place] Office: [phone] Pager:  [phone]

## 2020-03-17 NOTE — Progress Notes (Signed)
Subjective:    Patient ID: [name], female    DOB: 1951-08-16, 69 y.o.   MRN: [id number]  HPI Transferred out of ICU yesterday.  Confusion as to which service she was on.  Apparently it was requested that PCCM take over case however, this was not conveyed directly to me.  The patient also had resolved all of her acute issues after dialysis and was hemodynamically stable able to transfer to telemetry unit.  She has remained stable being monitored on telemetry.  No overnight issues.  Only complaint today is that of right temporomandibular joint tenderness particularly on occlusion.  She did have general anesthesia with an LMA in place.  She has a small crowded airway and LMA's are usually fairly bulky.  Suspect that this is due to positioning of LMA.  This should resolve hopefully in a few days.  Patient was reassured. She has had no chest pain.  No shortness of breath.  Review of Systems A 10 point review of systems was performed and it is as noted above otherwise negative.    Objective:   Physical Exam  BP (!) 126/49 (BP Location: Right Leg)   Pulse 71   Temp 98.9 F (37.2 C) (Oral)   Resp 18   Ht 5\' 2"  (1.575 m)   Wt 52.9 kg   SpO2 93%   BMI 21.33 kg/m   GENERAL: Frail-appearing, thin, awake and alert, no distress, conversant, speech fluent HEAD: Normocephalic, atraumatic.  EYES: Pupils equal, round, reactive to light.  No scleral icterus.  MOUTH: Oral mucosa moist, no thrush.  Tenderness along the right TM joint, no crepitus on mouth occlusion. NECK: Supple. No thyromegaly. No nodules. JVD present. PULMONARY: Lungs clear to auscultation bilaterally.  No adventitious sounds. CARDIOVASCULAR: S1 and S2.    Regular rate and rhythm.  Grade 3/6 harsh murmur at the left sternal border unchanged. GASTROINTESTINAL: Scaphoid abdomen. MUSCULOSKELETAL: No joint deformity, no clubbing, no edema.  Palpable thrill on axillary AV graft right brachial.  Old fistula on left without  thrill NEUROLOGIC: Conversant, no overt focal deficits.  Awake and alert, fully oriented. SKIN: Intact,warm,dry, no rashes. PSYCH: Mood and behavior normal.  BMET    Component Value Date/Time   NA 131 (L) 03/17/2020 0531   NA 140 07/13/2018 0814   K 5.4 (H) 03/17/2020 1759   CL 92 (L) 03/17/2020 0531   CO2 25 03/17/2020 0531   GLUCOSE 92 03/17/2020 0531   BUN 57 (H) 03/17/2020 0531   BUN 45 (H) 07/13/2018 0814   CREATININE 6.30 (H) 03/17/2020 0531   CALCIUM 8.3 (L) 03/17/2020 0531   CALCIUM 9.6 08/03/2011 1005   GFRNONAA 6 (L) 03/17/2020 0531   GFRAA 7 (L) 03/17/2020 0531   CBC    Component Value Date/Time   WBC 6.9 03/17/2020 0531   RBC 3.14 (L) 03/17/2020 0531   HGB 9.4 (L) 03/17/2020 0531   HGB 10.6 (L) 07/13/2018 0814   HGB 8.7 (L) 09/24/2011 1038   HCT 30.0 (L) 03/17/2020 0531   HCT 33.0 (L) 07/13/2018 0814   HCT 27.3 (L) 09/24/2011 1038   PLT 150 03/17/2020 0531   PLT 203 07/13/2018 0814   MCV 95.5 03/17/2020 0531   MCV 91 07/13/2018 0814   MCV 94.5 09/24/2011 1038   MCH 29.9 03/17/2020 0531   MCHC 31.3 03/17/2020 0531   RDW 16.8 (H) 03/17/2020 0531   RDW 16.9 (H) 07/13/2018 0814   RDW 20.1 (H) 09/24/2011 1038   LYMPHSABS 1.8 03/14/2020  0925   LYMPHSABS 1.1 09/24/2011 1038   MONOABS 0.6 03/14/2020 0925   MONOABS 0.6 09/24/2011 1038   EOSABS 0.3 03/14/2020 0925   EOSABS 0.1 09/24/2011 1038   BASOSABS 0.0 03/14/2020 0925   BASOSABS 0.0 09/24/2011 1038        Assessment & Plan:   Ventricular tachycardia Cardiac conduction aberrancy Related to hyperkalemia in the setting of ESRD Resolved with correction of potassium by dialysis No apparent recurrence Management per cardiology.  ESRD Dialysis Monday Wednesday Friday Hyperkalemia Potassium on upward trend again currently 5.4 Will need dialysis in a.m. 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  Hospitalist service transfer coordinator contacted this morning.   Renold Don, MD [facility]  *This note was dictated using voice recognition software/Dragon.  Despite best efforts to proofread, errors can occur which can change the meaning.  Any change was purely unintentional.

## 2020-03-17 NOTE — Progress Notes (Signed)
[facility]  ROUNDING NOTE   Subjective:   Patient is pleasant this morning with no complaints.   Objective:  Vital signs in last 24 hours:  Temp:  [98.1 F (36.7 C)-98.5 F (36.9 C)] 98.2 F (36.8 C) (04/04 0817) Pulse Rate:  [67-84] 76 (04/04 0817) Resp:  [11-20] 16 (04/04 0817) BP: (95-129)/(24-58) 109/31 (04/04 0817) SpO2:  [89 %-100 %] 91 % (04/04 0817) Weight:  [52.9 kg-53.6 kg] 52.9 kg (04/04 0602)  Weight change: -0.93 kg Filed Weights   03/16/20 0412 03/16/20 1414 03/17/20 0602  Weight: 53 kg 53.6 kg 52.9 kg    Intake/Output: I/O last 3 completed shifts: In: 1642.9 [P.O.:60; I.V.:1535.9; IV Piggyback:47] Out: 1150 [Other:1150]   Intake/Output this shift:  No intake/output data recorded.  Physical Exam: General: NAD, laying in bed  Head: Normocephalic, atraumatic. Moist oral mucosal membranes  Eyes: Anicteric, PERRL  Neck: Supple, trachea midline  Lungs:  Clear to auscultation  Heart: Regular rate and rhythm  Abdomen:  Soft, nontender,   Extremities: no peripheral edema.  Neurologic: Nonfocal, moving all four extremities  Skin: No lesions  Access: Left AVF + aneurysmal. Right AVG maturing.     Basic Metabolic Panel: Recent Labs  Lab 03/14/20 0925 03/14/20 0925 03/15/20 0632 03/15/20 1348 03/15/20 1348 03/15/20 2320 03/16/20 0545 03/17/20 0531  NA 140   < > 136 140  --  138 139 131*  K 4.4   < > 4.6 6.1*  --  7.3* 3.8 4.7  CL 92*   < > 99 98  --  102 97* 92*  CO2 30  --   --  22  --  14* 24 25  GLUCOSE 78   < > 81 144*  --  76 93 92  BUN 24*   < > 37* 46*  --  59* 20 57*  CREATININE 5.23*   < > 7.90* 7.81*  --  7.93* 3.11* 6.30*  CALCIUM 9.8   < >  --  9.4   < > 9.2 9.1 8.3*  MG  --   --   --   --   --  2.5*  --   --   PHOS  --   --   --   --   --   --  3.8 4.7*   < > = values in this interval not displayed.    Liver Function Tests: Recent Labs  Lab 03/15/20 2320  AST 290*  ALT 137*  ALKPHOS 132*  BILITOT 2.0*  PROT  6.3*  ALBUMIN 2.9*   No results for input(s): LIPASE, AMYLASE in the last 168 hours. No results for input(s): AMMONIA in the last 168 hours.  CBC: Recent Labs  Lab 03/14/20 0925 03/14/20 0925 03/15/20 0632 03/15/20 1348 03/15/20 2320 03/16/20 0545 03/17/20 0531  WBC 5.4  --   --  8.1 20.0* 17.4* 6.9  NEUTROABS 2.7  --   --   --   --   --   --   HGB 11.9*   < > 11.6* 10.7* 11.2* 11.2* 9.4*  HCT 37.4   < > 34.0* 34.7* 34.9* 33.6* 30.0*  MCV 94.4  --   --  97.7 95.1 92.6 95.5  PLT 247  --   --  266 219 191 150   < > = values in this interval not displayed.    Cardiac Enzymes: No results for input(s): CKTOTAL, CKMB, CKMBINDEX, TROPONINI in the last 168 hours.  BNP: Invalid input(s): POCBNP  CBG: Recent Labs  Lab 03/15/20 1949 03/15/20 2238 03/15/20 2331  GLUCAP 125* 69* 85    Microbiology: Results for orders placed or performed during the hospital encounter of 03/15/20  MRSA PCR Screening     Status: None   Collection Time: 03/15/20  8:34 PM   Specimen: Nasal Mucosa; Nasopharyngeal  Result Value Ref Range Status   MRSA by PCR NEGATIVE NEGATIVE Final    Comment:        The GeneXpert MRSA Assay (FDA approved for NASAL specimens only), is one component of a comprehensive MRSA colonization surveillance program. It is not intended to diagnose MRSA infection nor to guide or monitor treatment for MRSA infections. Performed at [facility], [address] [place], [place] [address]     Coagulation Studies: No results for input(s): LABPROT, INR in the last 72 hours.  Urinalysis: No results for input(s): COLORURINE, LABSPEC, PHURINE, GLUCOSEU, HGBUR, BILIRUBINUR, KETONESUR, PROTEINUR, UROBILINOGEN, NITRITE, LEUKOCYTESUR in the last 72 hours.  Invalid input(s): APPERANCEUR    Imaging: DG Chest Portable 1 View  Result Date: 03/15/2020 CLINICAL DATA:  Shortness of breath EXAM: PORTABLE CHEST 1 VIEW COMPARISON:  Portable exam 1102 hours compared to  01/01/2020 FINDINGS: Enlargement of cardiac silhouette with pulmonary vascular congestion. Atherosclerotic calcification aorta. Peribronchial thickening with accentuated interstitial markings likely representing pulmonary edema and CHF, slightly improved from previous study. No pleural effusion or pneumothorax. Bones demineralized with degenerative changes of the thoracic spine and probable chronic RIGHT rotator cuff tear. IMPRESSION: Enlargement of cardiac silhouette with vascular congestion and probable pulmonary edema, slightly improved. Electronically Signed   By: Ulyses Southward M.D.   On: 03/15/2020 11:10   ECHOCARDIOGRAM LIMITED  Result Date: 03/15/2020    ECHOCARDIOGRAM LIMITED REPORT   Patient Name:   [name] Date of Exam: 03/15/2020 Medical Rec #:  [id number]         Height:       62.0 in Accession #:    [id number]        Weight:       112.0 lb Date of Birth:  10/15/1951         BSA:          1.494 m Patient Age:    68 years          BP:           119/55 mmHg Patient Gender: F                 HR:           62 bpm. Exam Location:  [facility] Procedure: 2D Echo, Cardiac Doppler and Color Doppler Indications:     Acute Respiratory Insufficiency  History:         Patient has prior history of Echocardiogram examinations. CHF,                  CAD; Risk Factors:Hypertension.  Sonographer:     Neysa Bonito Roar Referring Phys:  [id number] CARMEN Knox Saliva Diagnosing Phys: Julien Nordmann MD IMPRESSIONS  1. Left ventricular ejection fraction, by estimation, is 50 to 55%. The left ventricle has low normal function. The left ventricle demonstrates regional wall motion abnormalities (distal anteroseptal and apical region, concerning for condution abnormality). There is moderate left ventricular hypertrophy. Left ventricular diastolic parameters are consistent with Grade I diastolic dysfunction (impaired relaxation).  2. Right ventricular systolic function is normal. The right ventricular size is normal. There is severely  elevated pulmonary artery systolic pressure. The  estimated right ventricular systolic pressure is 74.6 mmHg.  3. Left atrial size was moderately dilated.  4. Right atrial size was mildly dilated.  5. Tricuspid valve regurgitation is moderate to severe.  6. The inferior vena cava is dilated in size with <50% respiratory variability, suggesting right atrial pressure of 15 mmHg. FINDINGS  Left Ventricle: Left ventricular ejection fraction, by estimation, is 50 to 55%. The left ventricle has low normal function. The left ventricle demonstrates regional wall motion abnormalities. The left ventricular internal cavity size was normal in size. There is moderate left ventricular hypertrophy. Right Ventricle: The right ventricular size is normal. No increase in right ventricular wall thickness. Right ventricular systolic function is normal. There is severely elevated pulmonary artery systolic pressure. The tricuspid regurgitant velocity is 3.86 m/s, and with an assumed right atrial pressure of 15 mmHg, the estimated right ventricular systolic pressure is 74.6 mmHg. Left Atrium: Left atrial size was moderately dilated. Right Atrium: Right atrial size was mildly dilated. Pericardium: There is no evidence of pericardial effusion. Mitral Valve: The mitral valve is normal in structure. Normal mobility of the mitral valve leaflets. No evidence of mitral valve stenosis. Tricuspid Valve: The tricuspid valve is normal in structure. Tricuspid valve regurgitation is moderate to severe. No evidence of tricuspid stenosis. Aortic Valve: The aortic valve is normal in structure. Aortic valve regurgitation is not visualized. Mild to moderate aortic valve sclerosis/calcification is present, without any evidence of aortic stenosis. Aortic valve mean gradient measures 7.3 mmHg. Aortic valve peak gradient measures 13.9 mmHg. Aortic valve area, by VTI measures 2.26 cm. Pulmonic Valve: The pulmonic valve was normal in structure. Pulmonic valve  regurgitation is not visualized. No evidence of pulmonic stenosis. Aorta: The aortic root is normal in size and structure. Venous: The inferior vena cava is dilated in size with less than 50% respiratory variability, suggesting right atrial pressure of 15 mmHg. IAS/Shunts: No atrial level shunt detected by color flow Doppler.  LEFT VENTRICLE PLAX 2D LVIDd:         3.49 cm  Diastology LVIDs:         2.42 cm  LV e' lateral:   7.29 cm/s LV PW:         1.39 cm  LV E/e' lateral: 11.5 LV IVS:        1.54 cm  LV e' medial:    5.44 cm/s LVOT diam:     1.80 cm  LV E/e' medial:  15.4 LV SV:         86 LV SV Index:   58 LVOT Area:     2.54 cm  RIGHT VENTRICLE RV Mid diam:    4.29 cm RV S prime:     12.50 cm/s TAPSE (M-mode): 2.4 cm LEFT ATRIUM             Index       RIGHT ATRIUM           Index LA diam:        3.30 cm 2.21 cm/m  RA Area:     23.10 cm LA Vol (A2C):   82.5 ml 55.21 ml/m RA Volume:   77.40 ml  51.80 ml/m LA Vol (A4C):   43.3 ml 28.98 ml/m LA Biplane Vol: 60.5 ml 40.49 ml/m  AORTIC VALVE                    PULMONIC VALVE AV Area (Vmax):    1.99 cm     PV Vmax:  0.86 m/s AV Area (Vmean):   2.02 cm     PV Peak grad:   2.9 mmHg AV Area (VTI):     2.26 cm     RVOT Peak grad: 1 mmHg AV Vmax:           186.33 cm/s AV Vmean:          124.667 cm/s AV VTI:            0.380 m AV Peak Grad:      13.9 mmHg AV Mean Grad:      7.3 mmHg LVOT Vmax:         146.00 cm/s LVOT Vmean:        99.000 cm/s LVOT VTI:          0.338 m LVOT/AV VTI ratio: 0.89  AORTA Ao Root diam: 2.50 cm MITRAL VALVE               TRICUSPID VALVE MV Area (PHT): 2.74 cm    TR Peak grad:   59.6 mmHg MV Decel Time: 277 msec    TR Vmax:        386.00 cm/s MV E velocity: 83.90 cm/s MV A velocity: 98.50 cm/s  SHUNTS MV E/A ratio:  0.85        Systemic VTI:  0.34 m                            Systemic Diam: 1.80 cm Julien Nordmann MD Electronically signed by Julien Nordmann MD Signature Date/Time: 03/15/2020/5:03:50 PM    Final      Medications:     . allopurinol  150 mg Oral QHS  . alum & mag hydroxide-simeth  30 mL Oral Once  . Chlorhexidine Gluconate Cloth  6 each Topical Q0600  . cinacalcet  30 mg Oral Daily  . insulin aspart  5 Units Intravenous Once   And  . dextrose  1 ampule Intravenous Once  . dextrose  50 mL Intravenous Once  . docusate sodium  100 mg Oral BID  . ferric citrate  420 mg Oral TID WC  . heparin  5,000 Units Subcutaneous Q8H  . insulin aspart  10 Units Intravenous Once  . labetalol  100 mg Oral BID  . loratadine  10 mg Oral Daily  . simvastatin  20 mg Oral QHS   acetaminophen **OR** acetaminophen, HYDROcodone-acetaminophen, hydrOXYzine, lactulose, lidocaine (PF), pentafluoroprop-tetrafluoroeth, polyethylene glycol, sodium chloride, traZODone  Assessment/ Plan:  [name] is a 69 y.o. black female with end stage renal disease on hemodialysis, hypertension, anemia, hyperlipidemia, CVA, GERD, congestive heart failure, coronary artery disease, depression who was admitted to [facility] on 03/15/2020 for Change in mental state [R41.82] Ventricular tachycardia [facility]) [I47.2]  [facility]) [facility] MWF Left AVF 49kg  1. End Stage Renal Disease with hyperkalemia:  Next treatment scheduled for Monday.   2. Hypertension: 109/31. Home regimen of losartan and labetalol.  Continue hold losartan due to hyperkalemia - Continue labetalol. Hold prior to dialysis.  - Discussed case with cardiology.   3. Anemia with chronic kidney disease: hemoglobin 9.4.  - EPO with Monday's treatment.   4. Secondary Hyperparathyroidism: Calcium and phosphorus at goal. Outpatient labs PTH 500 on 3/3.  - cinacalcet - Auryxia with meals.    LOS: 2 Tailyn Hantz 4/4/20219:35 AM

## 2020-03-17 NOTE — Progress Notes (Signed)
Progress Note  Patient Name: [name] Date of Encounter: 03/17/2020  Primary Cardiologist: Agbor-Etang  Subjective   No events overnight Denies any significant shortness of breath, chest pain Telemetry reviewed, no arrhythmia Hemodialysis scheduled for tomorrow Potassium this morning 4.7  Inpatient Medications    Scheduled Meds: . allopurinol  150 mg Oral QHS  . alum & mag hydroxide-simeth  30 mL Oral Once  . Chlorhexidine Gluconate Cloth  6 each Topical Q0600  . cinacalcet  30 mg Oral Daily  . insulin aspart  5 Units Intravenous Once   And  . dextrose  1 ampule Intravenous Once  . dextrose  50 mL Intravenous Once  . docusate sodium  100 mg Oral BID  . [START ON 03/18/2020] epoetin (EPOGEN/PROCRIT) injection  10,000 Units Intravenous Q M,W,F-HD  . ferric citrate  420 mg Oral TID WC  . heparin  5,000 Units Subcutaneous Q8H  . insulin aspart  10 Units Intravenous Once  . labetalol  100 mg Oral BID  . loratadine  10 mg Oral Daily  . simvastatin  20 mg Oral QHS   Continuous Infusions:  PRN Meds: acetaminophen **OR** acetaminophen, HYDROcodone-acetaminophen, hydrOXYzine, lactulose, lidocaine (PF), pentafluoroprop-tetrafluoroeth, polyethylene glycol, sodium chloride, traZODone   Vital Signs    Vitals:   03/17/20 0602 03/17/20 0817 03/17/20 1222 03/17/20 1600  BP: (!) 95/24 (!) 109/31 (!) 119/31 (!) 125/38  Pulse: 67 76 71 68  Resp: 20 16 15 15   Temp: 98.2 F (36.8 C) 98.2 F (36.8 C) 98.3 F (36.8 C) 98.5 F (36.9 C)  TempSrc: Oral     SpO2: 93% 91% 97% 92%  Weight: 52.9 kg     Height:       No intake or output data in the 24 hours ending 03/17/20 1751 Filed Weights   03/16/20 0412 03/16/20 1414 03/17/20 0602  Weight: 53 kg 53.6 kg 52.9 kg    Telemetry    Normal sinus rhythm- Personally Reviewed  ECG     - Personally Reviewed  Physical Exam   Constitutional:  oriented to person, place, and time. No distress.  HENT:  Head: Grossly  normal Eyes:  no discharge. No scleral icterus.  Neck:  JVD 10+, no carotid bruits  Cardiovascular: Regular rate and rhythm, no murmurs appreciated Pulmonary/Chest: Moderately decreased breath sounds, scattered Rales Abdominal: Soft.  no distension.  no tenderness.  Musculoskeletal: Normal range of motion Neurological:  normal muscle tone. Coordination normal. No atrophy Skin: Skin warm and dry Psychiatric: normal affect, pleasant   Labs    Chemistry Recent Labs  Lab 03/15/20 2320 03/16/20 0545 03/17/20 0531  NA 138 139 131*  K 7.3* 3.8 4.7  CL 102 97* 92*  CO2 14* 24 25  GLUCOSE 76 93 92  BUN 59* 20 57*  CREATININE 7.93* 3.11* 6.30*  CALCIUM 9.2 9.1 8.3*  PROT 6.3*  --   --   ALBUMIN 2.9*  --   --   AST 290*  --   --   ALT 137*  --   --   ALKPHOS 132*  --   --   BILITOT 2.0*  --   --   GFRNONAA 5* 15* 6*  GFRAA 5* 17* 7*  ANIONGAP 22* 18* 14     Hematology Recent Labs  Lab 03/15/20 2320 03/16/20 0545 03/17/20 0531  WBC 20.0* 17.4* 6.9  RBC 3.67* 3.63* 3.14*  HGB 11.2* 11.2* 9.4*  HCT 34.9* 33.6* 30.0*  MCV 95.1 92.6 95.5  MCH 30.5 30.9 29.9  MCHC 32.1 33.3 31.3  RDW 17.0* 16.6* 16.8*  PLT 219 191 150    Cardiac EnzymesNo results for input(s): TROPONINI in the last 168 hours. No results for input(s): TROPIPOC in the last 168 hours.   BNPNo results for input(s): BNP, PROBNP in the last 168 hours.   DDimer No results for input(s): DDIMER in the last 168 hours.   Radiology    DG Chest Portable 1 View  Result Date: 03/15/2020 IMPRESSION: Enlargement of cardiac silhouette with vascular congestion and probable pulmonary edema, slightly improved. Electronically Signed   By: Lavonia Dana M.D.   On: 03/15/2020 11:10    Cardiac Studies   2D echo 03/15/2020: 1. Left ventricular ejection fraction, by estimation, is 50 to 55%. The  left ventricle has low normal function. The left ventricle demonstrates  regional wall motion abnormalities (distal anteroseptal  and apical region,  concerning for condution abnormality). There is moderate left ventricular hypertrophy. Left ventricular diastolic parameters are consistent with Grade I diastolic dysfunction (impaired relaxation).  2. Right ventricular systolic function is normal. The right ventricular  size is normal. There is severely elevated pulmonary artery systolic  pressure. The estimated right ventricular systolic pressure is 28.3 mmHg.  3. Left atrial size was moderately dilated.  4. Right atrial size was mildly dilated.  5. Tricuspid valve regurgitation is moderate to severe.  6. The inferior vena cava is dilated in size with <50% respiratory  variability, suggesting right atrial pressure of 15 mmHg. __________  Carlton Adam MPI 02/20/2020:  Nuclear stress EF: 69%.  There was no ST segment deviation noted during stress.  The study is normal.  This is a low risk study.  The left ventricular ejection fraction is hyperdynamic (>65%). __________  2D echo 01/01/2020: 1. Left ventricular ejection fraction, by visual estimation, is 60 to  65%. The left ventricle has normal function. There is mildly increased  left ventricular hypertrophy.  2. Left ventricular diastolic parameters are consistent with Grade II  diastolic dysfunction (pseudonormalization).  3. The left ventricle has no regional wall motion abnormalities.  4. Global right ventricle has normal systolic function.The right  ventricular size is normal. No increase in right ventricular wall  thickness.  5. Left atrial size was mildly dilated.  6. Right atrial size was mildly dilated.  7. Mild mitral annular calcification.  8. The mitral valve is normal in structure. Mild mitral valve  regurgitation. No evidence of mitral stenosis.  9. The tricuspid valve is normal in structure. Tricuspid valve  regurgitation is moderate.  10. The aortic valve is tricuspid. Aortic valve regurgitation is not  visualized. Mild aortic  valve sclerosis without stenosis.  11. The tricuspid regurgitant velocity is 3.66 m/s, and with an assumed  right atrial pressure of 3 mmHg, the estimated right ventricular systolic  pressure is moderately elevated at 56.6 mmHg.  12. The inferior vena cava is normal in size with greater than 50%  respiratory variability, suggesting right atrial pressure of 3 mmHg.   Patient Profile     69 y.o. 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  Assessment & Plan    1. Sustained VT: -Status post successful DCCV 2 nights ago in the setting of hyperkalemia Noted to have junctional rhythm, escalating to VT  Potassium was 7.3 -Restarted on her labetalol Will need close monitoring of potassium,  repeat potassium ordered this evening  End-stage renal disease on hemodialysis Had dialysis Wednesday, none Thursday, none on Friday until late, potassium up to 7.3 likely explaining her arrhythmia  Potassium late Friday night, scheduled for tomorrow/ Monday  Chest tightness/ Resolved after hemodialysis, echocardiogram confirming markedly elevated right heart pressures No further ischemic work-up at this time, currently with no symptoms -Fluid status managed by hemodialysis  Elevated troponin Following VT, tachycardia, Echocardiogram reviewed closely with new wall motion abnormality in the apical region, concerning more for catecholamine changes  -More recent echo stress 3 months ago with no abnormality No plan for ischemic work-up at this time  Pulmonary hypertension Markedly elevated right heart pressures on echo, dilated IVC, severe TR Received IV fluids during AV graft procedure likely causing chest fullness, fluid overload,  Symptoms resolved with dialysis   Total encounter time more than 25 minutes  Greater than 50% was spent in counseling and  coordination of care with the patient    For questions or updates, please contact [facility] Please consult [email or web address] for contact info under Cardiology/STEMI.    Signed, Esmond Plants, MD, Ph.D [facility]

## 2020-03-18 ENCOUNTER — Encounter: Payer: Self-pay | Admitting: Internal Medicine

## 2020-03-18 DIAGNOSIS — I9581 Postprocedural hypotension: Secondary | ICD-10-CM

## 2020-03-18 DIAGNOSIS — E875 Hyperkalemia: Secondary | ICD-10-CM

## 2020-03-18 DIAGNOSIS — I5032 Chronic diastolic (congestive) heart failure: Secondary | ICD-10-CM

## 2020-03-18 LAB — CBC
HCT: 28 % — ABNORMAL LOW (ref 36.0–46.0)
Hemoglobin: 9.6 g/dL — ABNORMAL LOW (ref 12.0–15.0)
MCH: 30.7 pg (ref 26.0–34.0)
MCHC: 34.3 g/dL (ref 30.0–36.0)
MCV: 89.5 fL (ref 80.0–100.0)
Platelets: 157 10*3/uL (ref 150–400)
RBC: 3.13 MIL/uL — ABNORMAL LOW (ref 3.87–5.11)
RDW: 16.6 % — ABNORMAL HIGH (ref 11.5–15.5)
WBC: 6.9 10*3/uL (ref 4.0–10.5)
nRBC: 0 % (ref 0.0–0.2)

## 2020-03-18 LAB — BASIC METABOLIC PANEL
Anion gap: 13 (ref 5–15)
BUN: 44 mg/dL — ABNORMAL HIGH (ref 8–23)
CO2: 26 mmol/L (ref 22–32)
Calcium: 7.9 mg/dL — ABNORMAL LOW (ref 8.9–10.3)
Chloride: 96 mmol/L — ABNORMAL LOW (ref 98–111)
Creatinine, Ser: 4.62 mg/dL — ABNORMAL HIGH (ref 0.44–1.00)
GFR calc Af Amer: 10 mL/min — ABNORMAL LOW (ref >60)
GFR calc non Af Amer: 9 mL/min — ABNORMAL LOW (ref >60)
Glucose, Bld: 99 mg/dL (ref 70–99)
Potassium: 3.5 mmol/L (ref 3.5–5.1)
Sodium: 135 mmol/L (ref 135–145)

## 2020-03-18 LAB — PHOSPHORUS: Phosphorus: 1.7 mg/dL — ABNORMAL LOW (ref 2.5–4.6)

## 2020-03-18 MED ORDER — LABETALOL HCL 200 MG PO TABS: 100.0000 mg | ORAL_TABLET | Freq: Two times a day (BID) | ORAL | 0 refills | Status: DC

## 2020-03-18 NOTE — Progress Notes (Signed)
Patient transferred to chair from bed then back to bed. Unsteady gait and balance. Acute confusion about location but quickly resolved at 0230. Resting comfortably in bed.

## 2020-03-18 NOTE — Progress Notes (Signed)
HD treatment completed. 

## 2020-03-18 NOTE — Discharge Summary (Signed)
Physician Discharge Summary  [name] ZOX:[id number] DOB: 04/11/1951 DOA: 03/15/2020  PCP: [name], MD  Admit date: 03/15/2020 Discharge date: 03/18/2020  Discharge disposition: Home   Recommendations for Outpatient Follow-Up:   Outpatient follow-up with vascular surgeon Outpatient follow-up with nephrologist for hemodialysis Outpatient follow-up with PCP Outpatient follow-up with cardiologist   Discharge Diagnosis:   Active Problems:   End stage renal disease ([place])   Anemia in chronic kidney disease   Chronic diastolic CHF (congestive heart failure) ([place])   H/O: CVA (cerebrovascular accident)   Hyperkalemia   Complication of vascular access for dialysis   Ventricular tachycardia ([place])   Seizure ([place])   Acute metabolic encephalopathy   Unresponsive episode   Hypotension after procedure   Ischemic chest pain ([place])    Discharge Condition: Stable.  Diet recommendation: Renal diet  Code status: Full code.    Hospital Course:   [name] is a 69 y.o. female with medical history significant for ESRD on HD MWF, anemia related to ESRD, diastolic heart failure, hypertension, admitted earlier by the vascular service for elective placement of right upper extremity dialysis catheter, using brachial nerve block, indicated for poorly functioning left upper extremity access.  While in the recovery unit, patient became hypotensive with systolic blood pressure in the 70s.  Blood work done at the time revealed potassium of 6.1.  Patient was given a fluid bolus of 1800 mL and also started on a Neo-Synephrine infusion and transferred to the intensive care unit.  Patient developed chest pain and was noted to have some ST-T wave changes.  She was evaluated by both cardiology and the intensivist at that time.  She had an echocardiogram that showed new wall motion abnormality possibly stress cardiomyopathy, post surgery and hypotension in the setting of hyperkalemia.   Troponin trend was recommended with continued observation in the ICU.  Patient's blood pressure responded to the intervention  Dialysis was initiated some hours later after arriving in the ICU.  Within 2 hours of starting dialysis, patient , became unresponsive and ventricular tachycardia of 160 noted on the monitor.  She remained with a pulse.  CODE BLUE was called.  She underwent immediate DC cardioversion, converting back to sinus rhythm with heart rate in the 70s and 80s.  At the same time, she was noted to have leftward gaze deviation, a presumed seizure, lasting less than a minute.  She was about to be intubated then the seizure resolved spontaneously, and decision made not to intubate. Vascular surgeon, Dr. Trula Slade requested transfer of patient to medical ICU service at that time.  Ventricular tachycardia and hyperkalemia have resolved.  He was transferred from the ICU to the medical floor for further management.  Her condition remained stable and she wanted to be discharged home because she was feeling better.  From cardiologist, vascular surgeon and nephrologist's standpoint, patient was okay for discharge.   Discharge Exam:   Vitals:   03/18/20 1216 03/18/20 1256  BP: (!) 133/59 107/69  Pulse: 71 71  Resp: 14   Temp:  98.1 F (36.7 C)  SpO2:  96%   Vitals:   03/18/20 1200 03/18/20 1215 03/18/20 1216 03/18/20 1256  BP: 118/73 (!) 131/46 (!) 133/59 107/69  Pulse: 65 68 71 71  Resp: 13 13 14    Temp:    81.1 F (36.7 C)  TempSrc:    Oral  SpO2:    96%  Weight:      Height:  GEN: NAD SKIN: No rash EYES: EOMI ENT: MMM CV: RRR PULM: CTA B ABD: soft, ND, NT, +BS CNS: AAO x 3, non focal EXT: No edema or tenderness   The results of significant diagnostics from this hospitalization (including imaging, microbiology, ancillary and laboratory) are listed below for reference.     Procedures and Diagnostic Studies:   DG Chest Portable 1 View  Result Date:  03/15/2020 CLINICAL DATA:  Shortness of breath EXAM: PORTABLE CHEST 1 VIEW COMPARISON:  Portable exam 1102 hours compared to 01/01/2020 FINDINGS: Enlargement of cardiac silhouette with pulmonary vascular congestion. Atherosclerotic calcification aorta. Peribronchial thickening with accentuated interstitial markings likely representing pulmonary edema and CHF, slightly improved from previous study. No pleural effusion or pneumothorax. Bones demineralized with degenerative changes of the thoracic spine and probable chronic RIGHT rotator cuff tear. IMPRESSION: Enlargement of cardiac silhouette with vascular congestion and probable pulmonary edema, slightly improved. Electronically Signed   By: Lavonia Dana M.D.   On: 03/15/2020 11:10   [place] OR NERVE BLOCK-IMAGE ONLY [facility])  Result Date: 03/15/2020 There is no interpretation for this exam.  This order is for images obtained during a surgical procedure.  Please See "Surgeries" Tab for more information regarding the procedure.   ECHOCARDIOGRAM LIMITED  Result Date: 03/15/2020    ECHOCARDIOGRAM LIMITED REPORT   Patient Name:   [name] Date of Exam: 03/15/2020 Medical Rec #:  [id number]         Height:       62.0 in Accession #:    [id number]        Weight:       112.0 lb Date of Birth:  [birth year]         BSA:          1.494 m Patient Age:    40 years          BP:           119/55 mmHg Patient Gender: F                 HR:           62 bpm. Exam Location:  [facility] Procedure: 2D Echo, Cardiac Doppler and Color Doppler Indications:     Acute Respiratory Insufficiency  History:         Patient has prior history of Echocardiogram examinations. CHF,                  CAD; Risk Factors:Hypertension.  Sonographer:     Alyse Low Roar Referring Phys:  [id number] CARMEN Veda Canning Diagnosing Phys: Ida Rogue MD IMPRESSIONS  1. Left ventricular ejection fraction, by estimation, is 50 to 55%. The left ventricle has low normal function. The left ventricle demonstrates regional wall  motion abnormalities (distal anteroseptal and apical region, concerning for condution abnormality). There is moderate left ventricular hypertrophy. Left ventricular diastolic parameters are consistent with Grade I diastolic dysfunction (impaired relaxation).  2. Right ventricular systolic function is normal. The right ventricular size is normal. There is severely elevated pulmonary artery systolic pressure. The estimated right ventricular systolic pressure is 96.2 mmHg.  3. Left atrial size was moderately dilated.  4. Right atrial size was mildly dilated.  5. Tricuspid valve regurgitation is moderate to severe.  6. The inferior vena cava is dilated in size with <50% respiratory variability, suggesting right atrial pressure of 15 mmHg. FINDINGS  Left Ventricle: Left ventricular ejection fraction, by estimation, is 50 to 55%. The left ventricle has low normal function.  The left ventricle demonstrates regional wall motion abnormalities. The left ventricular internal cavity size was normal in size. There is moderate left ventricular hypertrophy. Right Ventricle: The right ventricular size is normal. No increase in right ventricular wall thickness. Right ventricular systolic function is normal. There is severely elevated pulmonary artery systolic pressure. The tricuspid regurgitant velocity is 3.86 m/s, and with an assumed right atrial pressure of 15 mmHg, the estimated right ventricular systolic pressure is 07.6 mmHg. Left Atrium: Left atrial size was moderately dilated. Right Atrium: Right atrial size was mildly dilated. Pericardium: There is no evidence of pericardial effusion. Mitral Valve: The mitral valve is normal in structure. Normal mobility of the mitral valve leaflets. No evidence of mitral valve stenosis. Tricuspid Valve: The tricuspid valve is normal in structure. Tricuspid valve regurgitation is moderate to severe. No evidence of tricuspid stenosis. Aortic Valve: The aortic valve is normal in structure.  Aortic valve regurgitation is not visualized. Mild to moderate aortic valve sclerosis/calcification is present, without any evidence of aortic stenosis. Aortic valve mean gradient measures 7.3 mmHg. Aortic valve peak gradient measures 13.9 mmHg. Aortic valve area, by VTI measures 2.26 cm. Pulmonic Valve: The pulmonic valve was normal in structure. Pulmonic valve regurgitation is not visualized. No evidence of pulmonic stenosis. Aorta: The aortic root is normal in size and structure. Venous: The inferior vena cava is dilated in size with less than 50% respiratory variability, suggesting right atrial pressure of 15 mmHg. IAS/Shunts: No atrial level shunt detected by color flow Doppler.  LEFT VENTRICLE PLAX 2D LVIDd:         3.49 cm  Diastology LVIDs:         2.42 cm  LV e' lateral:   7.29 cm/s LV PW:         1.39 cm  LV E/e' lateral: 11.5 LV IVS:        1.54 cm  LV e' medial:    5.44 cm/s LVOT diam:     1.80 cm  LV E/e' medial:  15.4 LV SV:         86 LV SV Index:   58 LVOT Area:     2.54 cm  RIGHT VENTRICLE RV Mid diam:    4.29 cm RV S prime:     12.50 cm/s TAPSE (M-mode): 2.4 cm LEFT ATRIUM             Index       RIGHT ATRIUM           Index LA diam:        3.30 cm 2.21 cm/m  RA Area:     23.10 cm LA Vol (A2C):   82.5 ml 55.21 ml/m RA Volume:   77.40 ml  51.80 ml/m LA Vol (A4C):   43.3 ml 28.98 ml/m LA Biplane Vol: 60.5 ml 40.49 ml/m  AORTIC VALVE                    PULMONIC VALVE AV Area (Vmax):    1.99 cm     PV Vmax:        0.86 m/s AV Area (Vmean):   2.02 cm     PV Peak grad:   2.9 mmHg AV Area (VTI):     2.26 cm     RVOT Peak grad: 1 mmHg AV Vmax:           186.33 cm/s AV Vmean:          124.667 cm/s AV VTI:  0.380 m AV Peak Grad:      13.9 mmHg AV Mean Grad:      7.3 mmHg LVOT Vmax:         146.00 cm/s LVOT Vmean:        99.000 cm/s LVOT VTI:          0.338 m LVOT/AV VTI ratio: 0.89  AORTA Ao Root diam: 2.50 cm MITRAL VALVE               TRICUSPID VALVE MV Area (PHT): 2.74 cm    TR Peak  grad:   59.6 mmHg MV Decel Time: 277 msec    TR Vmax:        386.00 cm/s MV E velocity: 83.90 cm/s MV A velocity: 98.50 cm/s  SHUNTS MV E/A ratio:  0.85        Systemic VTI:  0.34 m                            Systemic Diam: 1.80 cm Ida Rogue MD Electronically signed by Ida Rogue MD Signature Date/Time: 03/15/2020/5:03:50 PM    Final      Labs:   Basic Metabolic Panel: Recent Labs  Lab 03/15/20 1348 03/15/20 1348 03/15/20 2320 03/15/20 2320 03/16/20 0545 03/16/20 0545 03/17/20 0531 03/17/20 0531 03/17/20 1759 03/18/20 0944  NA 140  --  138  --  139  --  131*  --   --  135  K 6.1*   < > 7.3*   < > 3.8   < > 4.7   < > 5.4* 3.5  CL 98  --  102  --  97*  --  92*  --   --  96*  CO2 22  --  14*  --  24  --  25  --   --  26  GLUCOSE 144*  --  76  --  93  --  92  --   --  99  BUN 46*  --  59*  --  20  --  57*  --   --  44*  CREATININE 7.81*  --  7.93*  --  3.11*  --  6.30*  --   --  4.62*  CALCIUM 9.4  --  9.2  --  9.1  --  8.3*  --   --  7.9*  MG  --   --  2.5*  --   --   --   --   --   --   --   PHOS  --   --   --   --  3.8  --  4.7*  --   --  1.7*   < > = values in this interval not displayed.   GFR Estimated Creatinine Clearance: 9.1 mL/min (A) (by C-G formula based on SCr of 4.62 mg/dL (H)). Liver Function Tests: Recent Labs  Lab 03/15/20 2320  AST 290*  ALT 137*  ALKPHOS 132*  BILITOT 2.0*  PROT 6.3*  ALBUMIN 2.9*   No results for input(s): LIPASE, AMYLASE in the last 168 hours. No results for input(s): AMMONIA in the last 168 hours. Coagulation profile Recent Labs  Lab 03/14/20 0925  INR 1.1    CBC: Recent Labs  Lab 03/14/20 0925 03/15/20 0632 03/15/20 1348 03/15/20 2320 03/16/20 0545 03/17/20 0531 03/18/20 0944  WBC 5.4  --  8.1 20.0* 17.4* 6.9 6.9  NEUTROABS 2.7  --   --   --   --   --   --  HGB 11.9*   < > 10.7* 11.2* 11.2* 9.4* 9.6*  HCT 37.4   < > 34.7* 34.9* 33.6* 30.0* 28.0*  MCV 94.4  --  97.7 95.1 92.6 95.5 89.5  PLT 247  --  266 219  191 150 157   < > = values in this interval not displayed.   Cardiac Enzymes: No results for input(s): CKTOTAL, CKMB, CKMBINDEX, TROPONINI in the last 168 hours. BNP: Invalid input(s): POCBNP CBG: Recent Labs  Lab 03/15/20 1949 03/15/20 2238 03/15/20 2331  GLUCAP 125* 69* 85   D-Dimer No results for input(s): DDIMER in the last 72 hours. Hgb A1c No results for input(s): HGBA1C in the last 72 hours. Lipid Profile No results for input(s): CHOL, HDL, LDLCALC, TRIG, CHOLHDL, LDLDIRECT in the last 72 hours. Thyroid function studies No results for input(s): TSH, T4TOTAL, T3FREE, THYROIDAB in the last 72 hours.  Invalid input(s): FREET3 Anemia work up No results for input(s): VITAMINB12, FOLATE, FERRITIN, TIBC, IRON, RETICCTPCT in the last 72 hours. Microbiology Recent Results (from the past 240 hour(s))  SARS CORONAVIRUS 2 (TAT 6-24 HRS) Nasopharyngeal Nasopharyngeal Swab     Status: None   Collection Time: 03/14/20  9:22 AM   Specimen: Nasopharyngeal Swab  Result Value Ref Range Status   SARS Coronavirus 2 NEGATIVE NEGATIVE Final    Comment: (NOTE) SARS-CoV-2 target nucleic acids are NOT DETECTED. The SARS-CoV-2 RNA is generally detectable in upper and lower respiratory specimens during the acute phase of infection. Negative results do not preclude SARS-CoV-2 infection, do not rule out co-infections with other pathogens, and should not be used as the sole basis for treatment or other patient management decisions. Negative results must be combined with clinical observations, patient history, and epidemiological information. The expected result is Negative. Fact Sheet for Patients: SugarRoll.be Fact Sheet for Healthcare Providers: https://www.woods-mathews.com/ This test is not yet approved or cleared by the [place] FDA and  has been authorized for detection and/or diagnosis of SARS-CoV-2 by FDA under an Emergency Use  Authorization (EUA). This EUA will remain  in effect (meaning this test can be used) for the duration of the COVID-19 declaration under Section 56 4(b)(1) of the Act, 21 U.S.C. section 360bbb-3(b)(1), unless the authorization is terminated or revoked sooner. Performed at [facility], [place] [address], [place], [place] [address]   MRSA PCR Screening     Status: None   Collection Time: 03/15/20  8:34 PM   Specimen: Nasal Mucosa; Nasopharyngeal  Result Value Ref Range Status   MRSA by PCR NEGATIVE NEGATIVE Final    Comment:        The GeneXpert MRSA Assay (FDA approved for NASAL specimens only), is one component of a comprehensive MRSA colonization surveillance program. It is not intended to diagnose MRSA infection nor to guide or monitor treatment for MRSA infections. Performed at [facility], [address], [place], [place] [address]      Discharge Instructions:   Discharge Instructions    Call MD for:  redness, tenderness, or signs of infection (pain, swelling, bleeding, redness, odor or green/yellow discharge around incision site)   Complete by: As directed    Call MD for:  severe or increased pain, loss or decreased feeling  in affected limb(s)   Complete by: As directed    Call MD for:  temperature >100.5   Complete by: As directed    Diet renal 60/70-01-15-1199   Complete by: As directed    Increase activity slowly  Complete by: As directed    Lifting restrictions   Complete by: As directed    No lifting right arm greater than 15 pounds for 2 weeks   No dressing needed   Complete by: As directed    Replace only if drainage present   Resume previous diet   Complete by: As directed      Allergies as of 03/18/2020      Reactions   Sulfa Antibiotics Other (See Comments)   Both parents allergic-so will not take   Adhesive [tape] Itching      Medication List    STOP taking these medications   losartan 50 MG tablet Commonly known as: COZAAR      TAKE these medications   acetaminophen 500 MG tablet Commonly known as: TYLENOL Take 1 tablet (500 mg total) by mouth every 6 (six) hours as needed. What changed: reasons to take this   allopurinol 300 MG tablet Commonly known as: ZYLOPRIM Take 150 mg by mouth daily.   ALPRAZolam 0.25 MG tablet Commonly known as: XANAX Take 0.25 mg by mouth every other day as needed for anxiety.   Auryxia 1 GM 210 MG(Fe) tablet Generic drug: ferric citrate Take 210 mg by mouth 3 (three) times daily with meals.   cinacalcet 30 MG tablet Commonly known as: SENSIPAR Take 30 mg by mouth daily.   Dialyvite 800 0.8 MG Wafr Take 1 Wafer by mouth daily.   HYDROcodone-acetaminophen 5-325 MG tablet Commonly known as: Norco Take 1-2 tablets by mouth every 6 (six) hours as needed for moderate pain or severe pain.   labetalol 200 MG tablet Commonly known as: NORMODYNE Take 0.5 tablets (100 mg total) by mouth 2 (two) times daily. What changed: when to take this   lactulose 10 GM/15ML solution Commonly known as: CHRONULAC Take 20 g by mouth daily as needed for mild constipation.   ondansetron 8 MG tablet Commonly known as: ZOFRAN Take 8 mg by mouth 2 (two) times daily as needed for nausea or vomiting.   simvastatin 20 MG tablet Commonly known as: ZOCOR Take 1 tablet (20 mg total) by mouth at bedtime.   zolpidem 10 MG tablet Commonly known as: AMBIEN Take 10 mg by mouth at bedtime as needed for sleep.      Follow-up Information    Schnier, Dolores Lory, MD Follow up in 2 week(s).   Specialties: Vascular Surgery, Cardiology, Radiology, Vascular Surgery Why: with right arm HDA Contact information: [place]  [phone]            Time coordinating discharge: 33 minutes  Signed:  Jennye Boroughs  [facility] 03/18/2020, 2:43 PM

## 2020-03-18 NOTE — Progress Notes (Signed)
HD initiation of treatment

## 2020-03-18 NOTE — Progress Notes (Signed)
D: Pt alert and oriented x 4. Pt denies experiencing any pain at this time.  A: Pt and family received discharge and medication education/information. Pt belongings were gathered and taken with pt upon discharge.   R: Pt and family verbalized understanding of discharge and medication education/information.  Pt escorted to medical mall front lobby by staff via wheelchair where family picked her up.

## 2020-03-18 NOTE — Progress Notes (Signed)
Unable to complete "During Hemodialysis Assessment" due to patient care.

## 2020-03-18 NOTE — Progress Notes (Signed)
Unable to complete "During HD Assessment" information due to patient care

## 2020-03-18 NOTE — Care Management Important Message (Signed)
Important Message  Patient Details  Name: CATALAYA GARR MRN: [id number] Date of Birth: 03-Jan-1951   Medicare Important Message Given:  Yes     Dannette Barbara 03/18/2020, 12:13 PM

## 2020-03-18 NOTE — Progress Notes (Signed)
[facility]  ROUNDING NOTE   Subjective:   Seen and examined on hemodialysis treatment. Tolerated treatment well.     HEMODIALYSIS FLOWSHEET:  Blood Flow Rate (mL/min): 400 mL/min Arterial Pressure (mmHg): -120 mmHg Venous Pressure (mmHg): 240 mmHg Transmembrane Pressure (mmHg): 60 mmHg Ultrafiltration Rate (mL/min): 570 mL/min Dialysate Flow Rate (mL/min): 600 ml/min Conductivity: Machine : 14.2 Conductivity: Machine : 14.2 Dialysis Fluid Bolus: Normal Saline Bolus Amount (mL): 250 mL Dialysate Change: 2K    Objective:  Vital signs in last 24 hours:  Temp:  [97.7 F (36.5 C)-98.9 F (37.2 C)] 98.1 F (36.7 C) (04/05 1542) Pulse Rate:  [63-79] 79 (04/05 1542) Resp:  [9-19] 14 (04/05 1216) BP: (97-144)/(44-77) 123/44 (04/05 1542) SpO2:  [91 %-99 %] 94 % (04/05 1542) Weight:  [51.2 kg] 51.2 kg (04/05 0357)  Weight change: -2.404 kg Filed Weights   03/17/20 0602 03/18/20 0044 03/18/20 0357  Weight: 52.9 kg 51.2 kg 51.2 kg    Intake/Output: No intake/output data recorded.   Intake/Output this shift:  Total I/O In: -  Out: 1500 [Other:1500]  Physical Exam: General: NAD, laying in bed  Head: Normocephalic, atraumatic. Moist oral mucosal membranes  Eyes: Anicteric, PERRL  Neck: Supple, trachea midline  Lungs:  Clear to auscultation  Heart: Regular rate and rhythm  Abdomen:  Soft, nontender,   Extremities: no peripheral edema.  Neurologic: Nonfocal, moving all four extremities  Skin: No lesions  Access: Left AVF + aneurysmal. Right AVG maturing.     Basic Metabolic Panel: Recent Labs  Lab 03/15/20 1348 03/15/20 1348 03/15/20 2320 03/15/20 2320 03/16/20 0545 03/17/20 0531 03/17/20 1759 03/18/20 0944  NA 140  --  138  --  139 131*  --  135  K 6.1*   < > 7.3*  --  3.8 4.7 5.4* 3.5  CL 98  --  102  --  97* 92*  --  96*  CO2 22  --  14*  --  24 25  --  26  GLUCOSE 144*  --  76  --  93 92  --  99  BUN 46*  --  59*  --  20 57*  --  44*   CREATININE 7.81*  --  7.93*  --  3.11* 6.30*  --  4.62*  CALCIUM 9.4   < > 9.2   < > 9.1 8.3*  --  7.9*  MG  --   --  2.5*  --   --   --   --   --   PHOS  --   --   --   --  3.8 4.7*  --  1.7*   < > = values in this interval not displayed.    Liver Function Tests: Recent Labs  Lab 03/15/20 2320  AST 290*  ALT 137*  ALKPHOS 132*  BILITOT 2.0*  PROT 6.3*  ALBUMIN 2.9*   No results for input(s): LIPASE, AMYLASE in the last 168 hours. No results for input(s): AMMONIA in the last 168 hours.  CBC: Recent Labs  Lab 03/14/20 0925 03/15/20 0632 03/15/20 1348 03/15/20 2320 03/16/20 0545 03/17/20 0531 03/18/20 0944  WBC 5.4  --  8.1 20.0* 17.4* 6.9 6.9  NEUTROABS 2.7  --   --   --   --   --   --   HGB 11.9*   < > 10.7* 11.2* 11.2* 9.4* 9.6*  HCT 37.4   < > 34.7* 34.9* 33.6* 30.0* 28.0*  MCV 94.4  --  97.7 95.1 92.6 95.5 89.5  PLT 247  --  266 219 191 150 157   < > = values in this interval not displayed.    Cardiac Enzymes: No results for input(s): CKTOTAL, CKMB, CKMBINDEX, TROPONINI in the last 168 hours.  BNP: Invalid input(s): POCBNP  CBG: Recent Labs  Lab 03/15/20 1949 03/15/20 2238 03/15/20 2331  GLUCAP 125* 61* 56    Microbiology: Results for orders placed or performed during the hospital encounter of 03/15/20  MRSA PCR Screening     Status: None   Collection Time: 03/15/20  8:34 PM   Specimen: Nasal Mucosa; Nasopharyngeal  Result Value Ref Range Status   MRSA by PCR NEGATIVE NEGATIVE Final    Comment:        The GeneXpert MRSA Assay (FDA approved for NASAL specimens only), is one component of a comprehensive MRSA colonization surveillance program. It is not intended to diagnose MRSA infection nor to guide or monitor treatment for MRSA infections. Performed at [facility], [address] [place], [place] [address]     Coagulation Studies: No results for input(s): LABPROT, INR in the last 72 hours.  Urinalysis: No results for  input(s): COLORURINE, LABSPEC, PHURINE, GLUCOSEU, HGBUR, BILIRUBINUR, KETONESUR, PROTEINUR, UROBILINOGEN, NITRITE, LEUKOCYTESUR in the last 72 hours.  Invalid input(s): APPERANCEUR    Imaging: No results found.   Medications:    . allopurinol  150 mg Oral QHS  . alum & mag hydroxide-simeth  30 mL Oral Once  . Chlorhexidine Gluconate Cloth  6 each Topical Q0600  . cinacalcet  30 mg Oral Daily  . insulin aspart  5 Units Intravenous Once   And  . dextrose  1 ampule Intravenous Once  . dextrose  50 mL Intravenous Once  . docusate sodium  100 mg Oral BID  . epoetin (EPOGEN/PROCRIT) injection  10,000 Units Intravenous Q M,W,F-HD  . ferric citrate  420 mg Oral TID WC  . heparin  5,000 Units Subcutaneous Q8H  . insulin aspart  10 Units Intravenous Once  . labetalol  100 mg Oral BID  . loratadine  10 mg Oral Daily  . simvastatin  20 mg Oral QHS  . sodium chloride flush  3 mL Intravenous Q12H   acetaminophen **OR** acetaminophen, HYDROcodone-acetaminophen, hydrOXYzine, lactulose, polyethylene glycol, sodium chloride, traZODone  Assessment/ Plan:  [name] is a 69 y.o. black female with end stage renal disease on hemodialysis, hypertension, anemia, hyperlipidemia, CVA, GERD, congestive heart failure, coronary artery disease, depression who was admitted to [facility] on 03/15/2020 for Change in mental state [R41.82] Ventricular tachycardia [facility]) [I47.2]  [facility]) [place] MWF Left AVF 49kg  1. End Stage Renal Disease with hyperkalemia:  Seen and examined on hemodialysis treatment. MWF schedule.   2. Hypertension: 109/31. Home regimen of losartan and labetalol.  Continue to hold losartan due to hyperkalemia - Continue labetalol.   3. Anemia with chronic kidney disease: hemoglobin 9.6 - EPO with HD treatment  4. Secondary Hyperparathyroidism: Calcium and phosphorus at goal. Outpatient labs PTH 500 on 3/3.  - cinacalcet - Auryxia with  meals.    LOS: 3 [name] 4/5/20215:14 PM

## 2020-03-19 DIAGNOSIS — F419 Anxiety disorder, unspecified: Secondary | ICD-10-CM | POA: Diagnosis not present

## 2020-03-19 DIAGNOSIS — Z48812 Encounter for surgical aftercare following surgery on the circulatory system: Secondary | ICD-10-CM | POA: Diagnosis not present

## 2020-03-19 DIAGNOSIS — M6281 Muscle weakness (generalized): Secondary | ICD-10-CM | POA: Diagnosis not present

## 2020-03-19 DIAGNOSIS — T8612 Kidney transplant failure: Secondary | ICD-10-CM | POA: Diagnosis not present

## 2020-03-19 DIAGNOSIS — Z992 Dependence on renal dialysis: Secondary | ICD-10-CM | POA: Diagnosis not present

## 2020-03-19 DIAGNOSIS — N186 End stage renal disease: Secondary | ICD-10-CM | POA: Diagnosis not present

## 2020-03-19 DIAGNOSIS — I132 Hypertensive heart and chronic kidney disease with heart failure and with stage 5 chronic kidney disease, or end stage renal disease: Secondary | ICD-10-CM | POA: Diagnosis not present

## 2020-03-19 DIAGNOSIS — M810 Age-related osteoporosis without current pathological fracture: Secondary | ICD-10-CM | POA: Diagnosis not present

## 2020-03-19 DIAGNOSIS — G8929 Other chronic pain: Secondary | ICD-10-CM | POA: Diagnosis not present

## 2020-03-19 DIAGNOSIS — T82510D Breakdown (mechanical) of surgically created arteriovenous fistula, subsequent encounter: Secondary | ICD-10-CM | POA: Diagnosis not present

## 2020-03-19 DIAGNOSIS — I5033 Acute on chronic diastolic (congestive) heart failure: Secondary | ICD-10-CM | POA: Diagnosis not present

## 2020-03-19 DIAGNOSIS — J811 Chronic pulmonary edema: Secondary | ICD-10-CM | POA: Diagnosis not present

## 2020-03-19 DIAGNOSIS — E441 Mild protein-calorie malnutrition: Secondary | ICD-10-CM | POA: Diagnosis not present

## 2020-03-19 DIAGNOSIS — N2581 Secondary hyperparathyroidism of renal origin: Secondary | ICD-10-CM | POA: Diagnosis not present

## 2020-03-19 DIAGNOSIS — M159 Polyosteoarthritis, unspecified: Secondary | ICD-10-CM | POA: Diagnosis not present

## 2020-03-20 ENCOUNTER — Telehealth (INDEPENDENT_AMBULATORY_CARE_PROVIDER_SITE_OTHER): Payer: Self-pay

## 2020-03-20 DIAGNOSIS — Z992 Dependence on renal dialysis: Secondary | ICD-10-CM | POA: Diagnosis not present

## 2020-03-20 DIAGNOSIS — D631 Anemia in chronic kidney disease: Secondary | ICD-10-CM | POA: Diagnosis not present

## 2020-03-20 DIAGNOSIS — N186 End stage renal disease: Secondary | ICD-10-CM | POA: Diagnosis not present

## 2020-03-20 DIAGNOSIS — N2581 Secondary hyperparathyroidism of renal origin: Secondary | ICD-10-CM | POA: Diagnosis not present

## 2020-03-20 NOTE — Telephone Encounter (Signed)
Home health has been made aware that its fine to do requesting orders

## 2020-03-20 NOTE — Telephone Encounter (Signed)
That's fine

## 2020-03-21 DIAGNOSIS — Z48812 Encounter for surgical aftercare following surgery on the circulatory system: Secondary | ICD-10-CM | POA: Diagnosis not present

## 2020-03-21 DIAGNOSIS — Z992 Dependence on renal dialysis: Secondary | ICD-10-CM | POA: Diagnosis not present

## 2020-03-21 DIAGNOSIS — I5033 Acute on chronic diastolic (congestive) heart failure: Secondary | ICD-10-CM | POA: Diagnosis not present

## 2020-03-21 DIAGNOSIS — N186 End stage renal disease: Secondary | ICD-10-CM | POA: Diagnosis not present

## 2020-03-21 DIAGNOSIS — T8612 Kidney transplant failure: Secondary | ICD-10-CM | POA: Diagnosis not present

## 2020-03-21 DIAGNOSIS — I132 Hypertensive heart and chronic kidney disease with heart failure and with stage 5 chronic kidney disease, or end stage renal disease: Secondary | ICD-10-CM | POA: Diagnosis not present

## 2020-03-22 DIAGNOSIS — N186 End stage renal disease: Secondary | ICD-10-CM | POA: Diagnosis not present

## 2020-03-22 DIAGNOSIS — D631 Anemia in chronic kidney disease: Secondary | ICD-10-CM | POA: Diagnosis not present

## 2020-03-22 DIAGNOSIS — Z992 Dependence on renal dialysis: Secondary | ICD-10-CM | POA: Diagnosis not present

## 2020-03-22 DIAGNOSIS — N2581 Secondary hyperparathyroidism of renal origin: Secondary | ICD-10-CM | POA: Diagnosis not present

## 2020-03-25 DIAGNOSIS — N2581 Secondary hyperparathyroidism of renal origin: Secondary | ICD-10-CM | POA: Diagnosis not present

## 2020-03-25 DIAGNOSIS — Z992 Dependence on renal dialysis: Secondary | ICD-10-CM | POA: Diagnosis not present

## 2020-03-25 DIAGNOSIS — D631 Anemia in chronic kidney disease: Secondary | ICD-10-CM | POA: Diagnosis not present

## 2020-03-25 DIAGNOSIS — N186 End stage renal disease: Secondary | ICD-10-CM | POA: Diagnosis not present

## 2020-03-26 DIAGNOSIS — I132 Hypertensive heart and chronic kidney disease with heart failure and with stage 5 chronic kidney disease, or end stage renal disease: Secondary | ICD-10-CM | POA: Diagnosis not present

## 2020-03-26 DIAGNOSIS — T8612 Kidney transplant failure: Secondary | ICD-10-CM | POA: Diagnosis not present

## 2020-03-26 DIAGNOSIS — N186 End stage renal disease: Secondary | ICD-10-CM | POA: Diagnosis not present

## 2020-03-26 DIAGNOSIS — Z48812 Encounter for surgical aftercare following surgery on the circulatory system: Secondary | ICD-10-CM | POA: Diagnosis not present

## 2020-03-26 DIAGNOSIS — Z992 Dependence on renal dialysis: Secondary | ICD-10-CM | POA: Diagnosis not present

## 2020-03-26 DIAGNOSIS — I5033 Acute on chronic diastolic (congestive) heart failure: Secondary | ICD-10-CM | POA: Diagnosis not present

## 2020-03-27 DIAGNOSIS — D631 Anemia in chronic kidney disease: Secondary | ICD-10-CM | POA: Diagnosis not present

## 2020-03-27 DIAGNOSIS — N2581 Secondary hyperparathyroidism of renal origin: Secondary | ICD-10-CM | POA: Diagnosis not present

## 2020-03-27 DIAGNOSIS — Z992 Dependence on renal dialysis: Secondary | ICD-10-CM | POA: Diagnosis not present

## 2020-03-27 DIAGNOSIS — N186 End stage renal disease: Secondary | ICD-10-CM | POA: Diagnosis not present

## 2020-03-28 ENCOUNTER — Telehealth (INDEPENDENT_AMBULATORY_CARE_PROVIDER_SITE_OTHER): Payer: Self-pay

## 2020-03-28 DIAGNOSIS — Z48812 Encounter for surgical aftercare following surgery on the circulatory system: Secondary | ICD-10-CM | POA: Diagnosis not present

## 2020-03-28 DIAGNOSIS — N186 End stage renal disease: Secondary | ICD-10-CM | POA: Diagnosis not present

## 2020-03-28 DIAGNOSIS — Z992 Dependence on renal dialysis: Secondary | ICD-10-CM | POA: Diagnosis not present

## 2020-03-28 DIAGNOSIS — T8612 Kidney transplant failure: Secondary | ICD-10-CM | POA: Diagnosis not present

## 2020-03-28 DIAGNOSIS — I132 Hypertensive heart and chronic kidney disease with heart failure and with stage 5 chronic kidney disease, or end stage renal disease: Secondary | ICD-10-CM | POA: Diagnosis not present

## 2020-03-28 DIAGNOSIS — I5033 Acute on chronic diastolic (congestive) heart failure: Secondary | ICD-10-CM | POA: Diagnosis not present

## 2020-03-28 NOTE — Telephone Encounter (Signed)
Ericka from [facility] called stating she needed a verbal order to adjust the patient's weight from just 107.8 to 107.8-120 range for her dialysis. Per Eulogio Ditch this is okay.

## 2020-03-29 DIAGNOSIS — D631 Anemia in chronic kidney disease: Secondary | ICD-10-CM | POA: Diagnosis not present

## 2020-03-29 DIAGNOSIS — N186 End stage renal disease: Secondary | ICD-10-CM | POA: Diagnosis not present

## 2020-03-29 DIAGNOSIS — N2581 Secondary hyperparathyroidism of renal origin: Secondary | ICD-10-CM | POA: Diagnosis not present

## 2020-03-29 DIAGNOSIS — Z992 Dependence on renal dialysis: Secondary | ICD-10-CM | POA: Diagnosis not present

## 2020-04-01 ENCOUNTER — Observation Stay (HOSPITAL_COMMUNITY)
Admission: EM | Admit: 2020-04-01 | Discharge: 2020-04-02 | Disposition: A | Discharge status: Home / Self Care | Payer: Medicare Other | Attending: Internal Medicine | Admitting: Internal Medicine

## 2020-04-01 ENCOUNTER — Other Ambulatory Visit: Payer: Self-pay

## 2020-04-01 ENCOUNTER — Encounter (HOSPITAL_COMMUNITY): Payer: Self-pay | Admitting: Internal Medicine

## 2020-04-01 ENCOUNTER — Emergency Department (HOSPITAL_COMMUNITY): Payer: Medicare Other

## 2020-04-01 DIAGNOSIS — E782 Mixed hyperlipidemia: Secondary | ICD-10-CM | POA: Diagnosis not present

## 2020-04-01 DIAGNOSIS — J449 Chronic obstructive pulmonary disease, unspecified: Secondary | ICD-10-CM | POA: Insufficient documentation

## 2020-04-01 DIAGNOSIS — I132 Hypertensive heart and chronic kidney disease with heart failure and with stage 5 chronic kidney disease, or end stage renal disease: Secondary | ICD-10-CM | POA: Insufficient documentation

## 2020-04-01 DIAGNOSIS — I5032 Chronic diastolic (congestive) heart failure: Secondary | ICD-10-CM | POA: Insufficient documentation

## 2020-04-01 DIAGNOSIS — E785 Hyperlipidemia, unspecified: Secondary | ICD-10-CM | POA: Insufficient documentation

## 2020-04-01 DIAGNOSIS — I700 Atherosclerosis of aorta: Secondary | ICD-10-CM | POA: Insufficient documentation

## 2020-04-01 DIAGNOSIS — N189 Chronic kidney disease, unspecified: Secondary | ICD-10-CM | POA: Diagnosis present

## 2020-04-01 DIAGNOSIS — I10 Essential (primary) hypertension: Secondary | ICD-10-CM | POA: Diagnosis not present

## 2020-04-01 DIAGNOSIS — F418 Other specified anxiety disorders: Secondary | ICD-10-CM | POA: Insufficient documentation

## 2020-04-01 DIAGNOSIS — G4700 Insomnia, unspecified: Secondary | ICD-10-CM | POA: Diagnosis not present

## 2020-04-01 DIAGNOSIS — Z992 Dependence on renal dialysis: Secondary | ICD-10-CM | POA: Diagnosis not present

## 2020-04-01 DIAGNOSIS — R9431 Abnormal electrocardiogram [ECG] [EKG]: Secondary | ICD-10-CM | POA: Diagnosis not present

## 2020-04-01 DIAGNOSIS — N186 End stage renal disease: Secondary | ICD-10-CM | POA: Diagnosis not present

## 2020-04-01 DIAGNOSIS — R079 Chest pain, unspecified: Secondary | ICD-10-CM | POA: Diagnosis not present

## 2020-04-01 DIAGNOSIS — D631 Anemia in chronic kidney disease: Secondary | ICD-10-CM | POA: Diagnosis not present

## 2020-04-01 DIAGNOSIS — Z8249 Family history of ischemic heart disease and other diseases of the circulatory system: Secondary | ICD-10-CM | POA: Diagnosis not present

## 2020-04-01 DIAGNOSIS — E7800 Pure hypercholesterolemia, unspecified: Secondary | ICD-10-CM | POA: Insufficient documentation

## 2020-04-01 DIAGNOSIS — Z882 Allergy status to sulfonamides status: Secondary | ICD-10-CM | POA: Diagnosis not present

## 2020-04-01 DIAGNOSIS — Z940 Kidney transplant status: Secondary | ICD-10-CM | POA: Diagnosis not present

## 2020-04-01 DIAGNOSIS — Z79899 Other long term (current) drug therapy: Secondary | ICD-10-CM | POA: Diagnosis not present

## 2020-04-01 DIAGNOSIS — Z8349 Family history of other endocrine, nutritional and metabolic diseases: Secondary | ICD-10-CM | POA: Insufficient documentation

## 2020-04-01 DIAGNOSIS — M1990 Unspecified osteoarthritis, unspecified site: Secondary | ICD-10-CM | POA: Diagnosis not present

## 2020-04-01 DIAGNOSIS — R0602 Shortness of breath: Secondary | ICD-10-CM

## 2020-04-01 DIAGNOSIS — Z888 Allergy status to other drugs, medicaments and biological substances status: Secondary | ICD-10-CM | POA: Diagnosis not present

## 2020-04-01 DIAGNOSIS — I4891 Unspecified atrial fibrillation: Secondary | ICD-10-CM | POA: Insufficient documentation

## 2020-04-01 DIAGNOSIS — I5033 Acute on chronic diastolic (congestive) heart failure: Secondary | ICD-10-CM | POA: Diagnosis present

## 2020-04-01 DIAGNOSIS — Z87891 Personal history of nicotine dependence: Secondary | ICD-10-CM | POA: Insufficient documentation

## 2020-04-01 DIAGNOSIS — Z20822 Contact with and (suspected) exposure to covid-19: Secondary | ICD-10-CM | POA: Insufficient documentation

## 2020-04-01 DIAGNOSIS — I739 Peripheral vascular disease, unspecified: Secondary | ICD-10-CM | POA: Diagnosis not present

## 2020-04-01 DIAGNOSIS — I509 Heart failure, unspecified: Secondary | ICD-10-CM | POA: Diagnosis not present

## 2020-04-01 DIAGNOSIS — Z8673 Personal history of transient ischemic attack (TIA), and cerebral infarction without residual deficits: Secondary | ICD-10-CM | POA: Insufficient documentation

## 2020-04-01 DIAGNOSIS — R000 Tachycardia, unspecified: Secondary | ICD-10-CM | POA: Diagnosis not present

## 2020-04-01 DIAGNOSIS — I2510 Atherosclerotic heart disease of native coronary artery without angina pectoris: Secondary | ICD-10-CM | POA: Diagnosis not present

## 2020-04-01 DIAGNOSIS — R0789 Other chest pain: Principal | ICD-10-CM | POA: Insufficient documentation

## 2020-04-01 DIAGNOSIS — I214 Non-ST elevation (NSTEMI) myocardial infarction: Secondary | ICD-10-CM | POA: Diagnosis not present

## 2020-04-01 LAB — BASIC METABOLIC PANEL
Anion gap: 17 — ABNORMAL HIGH (ref 5–15)
BUN: 56 mg/dL — ABNORMAL HIGH (ref 8–23)
CO2: 25 mmol/L (ref 22–32)
Calcium: 9.5 mg/dL (ref 8.9–10.3)
Chloride: 98 mmol/L (ref 98–111)
Creatinine, Ser: 8.41 mg/dL — ABNORMAL HIGH (ref 0.44–1.00)
GFR calc Af Amer: 5 mL/min — ABNORMAL LOW (ref >60)
GFR calc non Af Amer: 4 mL/min — ABNORMAL LOW (ref >60)
Glucose, Bld: 102 mg/dL — ABNORMAL HIGH (ref 70–99)
Potassium: 4.8 mmol/L (ref 3.5–5.1)
Sodium: 140 mmol/L (ref 135–145)

## 2020-04-01 LAB — HEPATIC FUNCTION PANEL
ALT: 8 U/L (ref 0–44)
AST: 20 U/L (ref 15–41)
Albumin: 3.2 g/dL — ABNORMAL LOW (ref 3.5–5.0)
Alkaline Phosphatase: 95 U/L (ref 38–126)
Bilirubin, Direct: 0.2 mg/dL (ref 0.0–0.2)
Indirect Bilirubin: 0.6 mg/dL (ref 0.3–0.9)
Total Bilirubin: 0.8 mg/dL (ref 0.3–1.2)
Total Protein: 6.7 g/dL (ref 6.5–8.1)

## 2020-04-01 LAB — LIPID PANEL
Cholesterol: 192 mg/dL (ref 0–200)
HDL: 45 mg/dL (ref >40)
LDL Cholesterol: 121 mg/dL — ABNORMAL HIGH (ref 0–99)
Total CHOL/HDL Ratio: 4.3 RATIO
Triglycerides: 131 mg/dL (ref <150)
VLDL: 26 mg/dL (ref 0–40)

## 2020-04-01 LAB — TROPONIN I (HIGH SENSITIVITY)
Troponin I (High Sensitivity): 42 ng/L — ABNORMAL HIGH (ref <18)
Troponin I (High Sensitivity): 51 ng/L — ABNORMAL HIGH (ref <18)
Troponin I (High Sensitivity): 48 ng/L — ABNORMAL HIGH (ref <18)

## 2020-04-01 LAB — CBC
HCT: 30 % — ABNORMAL LOW (ref 36.0–46.0)
Hemoglobin: 9.6 g/dL — ABNORMAL LOW (ref 12.0–15.0)
MCH: 30.7 pg (ref 26.0–34.0)
MCHC: 32 g/dL (ref 30.0–36.0)
MCV: 95.8 fL (ref 80.0–100.0)
Platelets: 261 10*3/uL (ref 150–400)
RBC: 3.13 MIL/uL — ABNORMAL LOW (ref 3.87–5.11)
RDW: 16.9 % — ABNORMAL HIGH (ref 11.5–15.5)
WBC: 7.1 10*3/uL (ref 4.0–10.5)
nRBC: 0 % (ref 0.0–0.2)

## 2020-04-01 LAB — SARS CORONAVIRUS 2 (TAT 6-24 HRS): SARS Coronavirus 2: NEGATIVE

## 2020-04-01 MED ORDER — LABETALOL HCL 200 MG PO TABS
100.0000 mg | ORAL_TABLET | Freq: Two times a day (BID) | ORAL | Status: DC
  Administered 2020-04-01: 13:00:00 100 mg via ORAL
  Filled 2020-04-01: qty 1

## 2020-04-01 MED ORDER — NITROGLYCERIN 0.4 MG SL SUBL: 0.4000 mg | SUBLINGUAL_TABLET | SUBLINGUAL | Status: DC | PRN

## 2020-04-01 MED ORDER — HYDRALAZINE HCL 50 MG PO TABS: 50.0000 mg | ORAL_TABLET | Freq: Four times a day (QID) | ORAL | Status: DC | PRN

## 2020-04-01 MED ORDER — ACETAMINOPHEN 325 MG PO TABS
650.0000 mg | ORAL_TABLET | ORAL | Status: DC | PRN
  Filled 2020-04-01: qty 2

## 2020-04-01 MED ORDER — HEPARIN SODIUM (PORCINE) 5000 UNIT/ML IJ SOLN
5000.0000 [IU] | Freq: Three times a day (TID) | INTRAMUSCULAR | Status: DC
  Administered 2020-04-01 – 2020-04-02 (×3): 5000 [IU] via SUBCUTANEOUS
  Filled 2020-04-01 (×3): qty 1

## 2020-04-01 MED ORDER — CINACALCET HCL 30 MG PO TABS
30.0000 mg | ORAL_TABLET | Freq: Every day | ORAL | Status: DC
  Administered 2020-04-01 – 2020-04-02 (×2): 30 mg via ORAL
  Filled 2020-04-01 (×2): qty 1

## 2020-04-01 MED ORDER — CARVEDILOL 6.25 MG PO TABS
6.2500 mg | ORAL_TABLET | Freq: Two times a day (BID) | ORAL | Status: DC
  Administered 2020-04-01 – 2020-04-02 (×2): 6.25 mg via ORAL
  Filled 2020-04-01 (×2): qty 1

## 2020-04-01 MED ORDER — HYDROCODONE-ACETAMINOPHEN 5-325 MG PO TABS: 1.0000 | ORAL_TABLET | Freq: Four times a day (QID) | ORAL | Status: DC | PRN

## 2020-04-01 MED ORDER — SIMVASTATIN 20 MG PO TABS
20.0000 mg | ORAL_TABLET | Freq: Every day | ORAL | Status: DC
  Administered 2020-04-01: 20 mg via ORAL
  Filled 2020-04-01: qty 1

## 2020-04-01 MED ORDER — ALLOPURINOL 300 MG PO TABS
150.0000 mg | ORAL_TABLET | Freq: Every day | ORAL | Status: DC
  Administered 2020-04-01 – 2020-04-02 (×2): 150 mg via ORAL
  Filled 2020-04-01: qty 0.5
  Filled 2020-04-01: qty 1

## 2020-04-01 MED ORDER — ZOLPIDEM TARTRATE 5 MG PO TABS: 10.0000 mg | ORAL_TABLET | Freq: Every evening | ORAL | Status: DC | PRN

## 2020-04-01 MED ORDER — ONDANSETRON HCL 4 MG/2ML IJ SOLN
4.0000 mg | Freq: Four times a day (QID) | INTRAMUSCULAR | Status: DC | PRN
  Administered 2020-04-01: 4 mg via INTRAVENOUS
  Filled 2020-04-01: qty 2

## 2020-04-01 MED ORDER — SODIUM CHLORIDE 0.9% FLUSH: 3.0000 mL | Freq: Once | INTRAVENOUS | Status: DC

## 2020-04-01 MED ORDER — ALPRAZOLAM 0.25 MG PO TABS: 0.2500 mg | ORAL_TABLET | ORAL | Status: DC | PRN

## 2020-04-01 MED ORDER — FERRIC CITRATE 1 GM 210 MG(FE) PO TABS
420.0000 mg | ORAL_TABLET | Freq: Three times a day (TID) | ORAL | Status: DC
  Administered 2020-04-01 – 2020-04-02 (×2): 420 mg via ORAL
  Filled 2020-04-01 (×6): qty 2

## 2020-04-01 MED ORDER — LACTULOSE 10 GM/15ML PO SOLN: 20.0000 g | Freq: Every day | ORAL | Status: DC | PRN

## 2020-04-01 MED ORDER — ZOLPIDEM TARTRATE 5 MG PO TABS
5.0000 mg | ORAL_TABLET | Freq: Every evening | ORAL | Status: DC | PRN
  Administered 2020-04-01: 5 mg via ORAL
  Filled 2020-04-01: qty 1

## 2020-04-01 MED ORDER — CHLORHEXIDINE GLUCONATE CLOTH 2 % EX PADS
6.0000 | MEDICATED_PAD | Freq: Every day | CUTANEOUS | Status: DC
  Administered 2020-04-02: 05:00:00 6 via TOPICAL

## 2020-04-01 NOTE — H&P (Signed)
History and Physical    [name] KNL:[id number] DOB: Nov 13, 1951 DOA: 04/01/2020  PCP: [name], MD  Patient coming from: Home   Chief Complaint:  Chief Complaint  Patient presents with  . Chest Pain  . Shortness of Breath     HPI:  69 year old female with past medical history of end-stage renal disease (HD MWF), anemia of chronic disease, diastolic congestive heart failure, hypertension, hyperlipidemia, previous TIA and recent hospitalization from 4/2 to 4/5 at [facility] ended for elective placement of a right upper extremity dialysis graft by Dr. Trula Slade complicated by  severe hypokalemia leading to ventricular tachycardia requiring DC cardioversion and ICU monitoring prior to being discharged home.  Patient states that she was in her bathroom yesterday evening when she began to experience midsternal chest pain.  Patient describes this pain as pressure like in quality, moderate in intensity and radiating towards the neck.  Patient complains of associated shortness of breath but denies any associated diaphoresis.  Patient then proceeded to walk to her bed where she attempted to rest.  The discomfort lasted for approximately an hour before she called EMS.  Upon EMSs arrival aspirin and nitroglycerin were administered which the patient states improved her chest discomfort.  Patient denies leg swelling, paroxysmal nocturnal dyspnea, pillow orthopnea or palpitations.  Upon presentation to [facility] emergency department EKG revealed no dynamic changes however troponin was found to be slightly elevated at 42.  The emergency department provider did discuss the case with the overnight cardiologist who stated that considering patient's complicated recent hospitalization placing the patient in observation with cardiology consultation was warranted.  The hospitalist group was then called to assess patient for admission to the hospital.  Review of Systems: A 10-system review of systems  has been performed and all systems are negative with the exception of what is listed in the HPI.   Past Medical History:  Diagnosis Date  . Adenomatous polyp of colon 10/2010, 2006, 2015  . Anemia in CKD (chronic kidney disease) 11/07/2012   s/p blood transfusion.   . Arthritis   . CAD (coronary artery disease)    "something like that"  . CHF (congestive heart failure) ([place])   . Constipation   . Depression with anxiety   . Diverticula, colon   . 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  Had some early rejection then stable function for years, then had slow decline of function and went back on hemodialysis in 2012.  Gets HD TTS schedule at [facility] on [facility] still using L forearm AVF.     Marland Kitchen GERD (gastroesophageal reflux disease)   . GI bleed 2017   felt to be ischemic colitis, last colo 2015  . Headache   . Hyperlipidemia   . Hypertension   . Neurologic gait dysfunction   . Neuromuscular disorder (HCC)    neuropathy hand and legs  . Osteoporosis   . Pneumonia   . 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 Pulmonary edema 12/2019  . Stroke [facility]) 11/2015   TIA  . Weight loss, unintentional     Past Surgical History:  Procedure Laterality Date  . AV FISTULA PLACEMENT     for dialysis  . AV FISTULA PLACEMENT Left 11/22/2015   Procedure: ARTERIOVENOUS (AV) FISTULA CREATION-LEFT BRACHIOCEPHALIC;  Surgeon: Serafina Mitchell, MD;  Location: [place];  Service: Vascular;  Laterality:  Left;  . AV FISTULA PLACEMENT Right 03/15/2020   Procedure: INSERTION OF ARTERIOVENOUS (AV) GORE-TEX GRAFT ARM ( BRACHIAL AXILLARY );  Surgeon: Katha Cabal, MD;  Location: [facility];  Service: Vascular;  Laterality: Right;  . BACK SURGERY    . CERVICAL FUSION    . CHOLECYSTECTOMY  12/02/2012   Procedure: LAPAROSCOPIC CHOLECYSTECTOMY WITH INTRAOPERATIVE CHOLANGIOGRAM;  Surgeon: Edward Jolly, MD;  Location: [place];  Service: General;  Laterality: N/A;  . EYE SURGERY Bilateral    cataract surgery  . EYE SURGERY Left 2019   laser  . HEMATOMA EVACUATION Left 12/24/2016   Procedure: EVACUATION HEMATOMA LEFT UPPER ARM;  Surgeon: Waynetta Sandy, MD;  Location: [place];  Service: Vascular;  Laterality: Left;  . I & D EXTREMITY Left 12/31/2016   Procedure: IRRIGATION AND DEBRIDEMENT EXTREMITY;  Surgeon: Angelia Mould, MD;  Location: [place];  Service: Vascular;  Laterality: Left;  . INSERTION OF DIALYSIS CATHETER Right 12/24/2016   Procedure: INSERTION OF DIALYSIS CATHETER;  Surgeon: Waynetta Sandy, MD;  Location: [place];  Service: Vascular;  Laterality: Right;  . INSERTION OF DIALYSIS CATHETER Right 02/04/2017   Procedure: INSERTION OF DIALYSIS CATHETER;  Surgeon: Waynetta Sandy, MD;  Location: [place];  Service: Vascular;  Laterality: Right;  . KIDNEY TRANSPLANT  1996  . PERIPHERAL VASCULAR CATHETERIZATION Left 10/23/2016   Procedure: Fistulagram;  Surgeon: Elam Dutch, MD;  Location: [place] CV LAB;  Service: Cardiovascular;  Laterality: Left;  . RESECTION OF ARTERIOVENOUS FISTULA ANEURYSM Left 11/22/2015   Procedure: RESECTION OF LEFT RADIOCEPHALIC FISTULA ANEURYSM ;  Surgeon: Serafina Mitchell, MD;  Location: [place];  Service: Vascular;  Laterality: Left;  . REVISON OF ARTERIOVENOUS FISTULA Left 12/22/2016   Procedure: REVISON OF LEFT ARTERIOVENOUS FISTULA;  Surgeon: Waynetta Sandy, MD;  Location: [place];  Service: Vascular;  Laterality: Left;  . REVISON OF ARTERIOVENOUS FISTULA Left 02/04/2017   Procedure: REVISON OF LEFT UPPER ARM ARTERIOVENOUS FISTULA;  Surgeon: Waynetta Sandy, MD;  Location: [place];  Service: Vascular;  Laterality: Left;  . UPPER EXTREMITY ANGIOGRAPHY Bilateral 09/19/2019   Procedure: UPPER EXTREMITY ANGIOGRAPHY;  Surgeon: Katha Cabal, MD;  Location: [place] CV LAB;  Service: Cardiovascular;   Laterality: Bilateral;     reports that she quit smoking about 28 years ago. Her smoking use included cigarettes. She has never used smokeless tobacco. She reports that she does not drink alcohol or use drugs.  Allergies  Allergen Reactions  . Sulfa Antibiotics Other (See Comments)    Both parents allergic-so will not take  . Adhesive [Tape] Itching    Family History  Problem Relation Age of Onset  . Colon cancer Brother   . Cancer Brother   . Coronary artery disease Mother 68  . Hyperlipidemia Mother   . Hypertension Mother   . Stroke Maternal Aunt   . Esophageal cancer Neg Hx   . Stomach cancer Neg Hx   . Rectal cancer Neg Hx      Prior to Admission medications   Medication Sig Start Date End Date Taking? Authorizing Provider  acetaminophen (TYLENOL) 500 MG tablet Take 1 tablet (500 mg total) by mouth every 6 (six) hours as needed. Patient taking differently: Take 500 mg by mouth every 6 (six) hours as needed for mild pain.  09/05/18  Yes Law, Bea Graff, PA-C  allopurinol (ZYLOPRIM) 300 MG tablet Take 150 mg by mouth daily.    Yes [provider]  ALPRAZolam (XANAX) 0.25 MG tablet Take 0.25 mg by mouth every other day as needed for anxiety.    Yes [provider]  AURYXIA 1 GM 210 MG(Fe) tablet Take 420 mg by mouth 3 (three) times daily with meals.    Yes [provider]  B Complex-C-Folic Acid (DIALYVITE 932) 0.8 MG WAFR Take 1 Wafer by mouth daily. 10/25/18  Yes [provider]  cinacalcet (SENSIPAR) 30 MG tablet Take 30 mg by mouth daily. 01/04/20  Yes [provider]  HYDROcodone-acetaminophen (NORCO) 5-325 MG tablet Take 1-2 tablets by mouth every 6 (six) hours as needed for moderate pain or severe pain. 03/15/20  Yes Schnier, Dolores Lory, MD  labetalol (NORMODYNE) 200 MG tablet Take 0.5 tablets (100 mg total) by mouth 2 (two) times daily. 03/18/20  Yes Jennye Boroughs, MD  lactulose (CHRONULAC) 10 GM/15ML solution Take 20 g by mouth  daily as needed for mild constipation.   Yes [provider]  ondansetron (ZOFRAN) 8 MG tablet Take 8 mg by mouth 2 (two) times daily as needed for nausea or vomiting.    Yes [provider]  zolpidem (AMBIEN) 10 MG tablet Take 10 mg by mouth at bedtime as needed for sleep.    Yes [provider]  simvastatin (ZOCOR) 20 MG tablet Take 1 tablet (20 mg total) by mouth at bedtime. Patient not taking: Reported on 03/18/2020 02/26/17   Velvet Bathe, MD    Physical Exam: Vitals:   04/01/20 0400 04/01/20 0415 04/01/20 0500 04/01/20 0530  BP: (!) 148/54 (!) 150/50 (!) 137/55 133/82  Pulse: 83 72 77 80  Resp: 15 13 15 16   Temp:      TempSrc:      SpO2: 100% 97% 99% 100%     Constitutional: Acute alert and oriented x3, no associated distress.   Skin: no rashes, no lesions, good skin turgor noted. Eyes: Pupils are equally reactive to light.  No evidence of scleral icterus or conjunctival pallor.  ENMT: Mucous membranes are moist. Posterior pharynx clear of any exudate or lesions. Normal dentition.   Neck: normal, supple, no masses, no thyromegaly Respiratory: clear to auscultation bilaterally, no wheezing, no crackles. Normal respiratory effort. No accessory muscle use.  Cardiovascular: Regular rate and rhythm, no murmurs / rubs / gallops. No extremity edema. 2+ pedal pulses. No carotid bruits.  Chest: Notable anterior chest wall tenderness without evidence of crepitus or deformity. Back:   Nontender without crepitus or deformity. Abdomen: Abdomen is soft and nontender.  No evidence of intra-abdominal masses.  Positive bowel sounds noted in all quadrants.   Musculoskeletal: Bilateral upper extremity fistulas noted, left larger than right both with palpable thrill.  No joint deformity upper and lower extremities. Good ROM, no contractures. Normal muscle tone.  Neurologic: CN 2-12 grossly intact. Sensation intact, strength noted to be 5 out of 5 in all 4 extremities.   Patient is following all commands.  Patient is responsive to verbal stimuli.   Psychiatric: Patient presents as a normal mood with appropriate affect.  Patient seems to possess insight as to theircurrent situation.     Labs on Admission: I have personally reviewed following labs and imaging studies -   CBC: Recent Labs  Lab 04/01/20 0054  WBC 7.1  HGB 9.6*  HCT 30.0*  MCV 95.8  PLT 671   Basic Metabolic Panel: Recent Labs  Lab 04/01/20 0054  NA 140  K 4.8  CL 98  CO2 25  GLUCOSE 102*  BUN  56*  CREATININE 8.41*  CALCIUM 9.5   GFR: CrCl cannot be calculated (Unknown ideal weight.). Liver Function Tests: No results for input(s): AST, ALT, ALKPHOS, BILITOT, PROT, ALBUMIN in the last 168 hours. No results for input(s): LIPASE, AMYLASE in the last 168 hours. No results for input(s): AMMONIA in the last 168 hours. Coagulation Profile: No results for input(s): INR, PROTIME in the last 168 hours. Cardiac Enzymes: No results for input(s): CKTOTAL, CKMB, CKMBINDEX, TROPONINI in the last 168 hours. BNP (last 3 results) No results for input(s): PROBNP in the last 8760 hours. HbA1C: No results for input(s): HGBA1C in the last 72 hours. CBG: No results for input(s): GLUCAP in the last 168 hours. Lipid Profile: No results for input(s): CHOL, HDL, LDLCALC, TRIG, CHOLHDL, LDLDIRECT in the last 72 hours. Thyroid Function Tests: No results for input(s): TSH, T4TOTAL, FREET4, T3FREE, THYROIDAB in the last 72 hours. Anemia Panel: No results for input(s): VITAMINB12, FOLATE, FERRITIN, TIBC, IRON, RETICCTPCT in the last 72 hours. Urine analysis:    Component Value Date/Time   COLORURINE YELLOW 01/30/2011 1648   APPEARANCEUR CLOUDY (A) 01/30/2011 1648   LABSPEC 1.016 01/30/2011 1648   PHURINE 5.0 01/30/2011 1648   GLUCOSEU NEGATIVE 10/14/2009 0747   HGBUR SMALL (A) 01/30/2011 1648   BILIRUBINUR NEGATIVE 01/30/2011 1648   KETONESUR NEGATIVE 01/30/2011 1648   PROTEINUR 100 (A)  01/30/2011 1648   UROBILINOGEN 0.2 01/30/2011 1648   NITRITE NEGATIVE 01/30/2011 1648   LEUKOCYTESUR SMALL (A) 01/30/2011 1648    Radiological Exams on Admission personally reviewed: DG Chest 2 View  Result Date: 04/01/2020 CLINICAL DATA:  Chest pain EXAM: CHEST - 2 VIEW COMPARISON:  03/15/2020 FINDINGS: Cardiomegaly. Aortic atherosclerosis. There is hyperinflation of the lungs compatible with COPD. Vascular congestion. Bibasilar opacities, likely atelectasis or scarring. No effusions or acute bony abnormality. IMPRESSION: COPD. Cardiomegaly, vascular congestion.  Aortic atherosclerosis. Bibasilar atelectasis or scarring. Electronically Signed   By: Rolm Baptise M.D.   On: 04/01/2020 01:17    EKG: Personally reviewed.  Rhythm is normal sinus rhythm with heart rate of 80.  T wave inversions noted in the anterior septal leads.  No dynamic ST segment changes appreciated.  Assessment/Plan Principal Problem:   Chest pain   Patient presenting with chest discomfort with both typical and atypical features  Physical examination reveals reproducible chest discomfort  Per emergency department provider discussion with overnight cardiology  - considering patient's recent episode of ventricular tachycardia requiring DC cardioversion as well as recent echocardiogram on 4/2 revealing regional wall motion abnormalities of the distal anterior septal and apical region placement in observation with serial cardiac enzymes and cardiology consultation is warranted.  Of note, focal wall motion abnormalities during recent hospitalization at the time was felt by cardiology to be secondary to stress of recent DC cardioversion  Patient been placed on cardiac telemetry  Serial troponins ordered  She has tentatively been made n.p.o.  As needed nitroglycerin for chest pain  Active Problems:   End-stage renal disease on hemodialysis [facility])   Patient is on Monday Wednesday Friday hemodialysis  schedule  Patient will likely require nephrology involvement to resume dialysis while hospitalized    Anemia in chronic kidney disease   Hemoglobin and hematocrit stable  No evidence of bleeding  Monitor hemoglobin hematocrit with serial CBCs.    Chronic diastolic CHF (congestive heart failure) (HCC)   No evidence of cardiogenic volume overload    Essential (primary) hypertension   Continue home regimen of antihypertensives  We will provide  patient with as needed additional antihypertensives for excessively elevated blood pressure.    Hyperlipidemia   Continue home regimen of statin  Obtaining lipid panel      Code Status:  Full code Family Communication: Deferred  Status is: Observation  The patient remains OBS appropriate and will d/c before 2 midnights.  Dispo: The patient is from: Home              Anticipated d/c is to: Home              Anticipated d/c date is: 2 days              Patient currently is not medically stable to d/c.        Vernelle Emerald MD [facility] Pager [phone]  If 7PM-7AM, please contact night-coverage [email or web address] Use universal [facility] password for that web site. If you do not have the password, please call the hospital operator.  04/01/2020, 5:48 AM

## 2020-04-01 NOTE — ED Provider Notes (Signed)
[facility] EMERGENCY DEPARTMENT Provider Note   CSN: [id number] Arrival date & time: 04/01/20  0042     History Chief Complaint  Patient presents with  . Chest Pain  . Shortness of Breath    [name] is a 69 y.o. female with PMH/o ESRD (MWF dialysis), CAD, CHF, GERD who presents for evaluation of chest pain and SOB that began about 2 hrs prior to ED arrival. Patient reports she was washing up for bed when her chest pain originally started.  She states it felt like a heaviness on her chest.  She felt like it radiated up into her right jaw.  She also felt like she was short of breath.  No nausea, vomiting, diaphoresis.  Patient was given 324 mg aspirin and 1 times nitro by EMS which she states improved her pain.  Currently states pain is 0/10.  Patient states that she was concerned because it felt like when she had "her heart issue before."  Patient states she has not followed up with cardiology yet.  She does not smoke.  She does have history of high blood pressure but no history of diabetes.  No family history of MI.  She is a Monday, Wednesday, Friday dialysis patient.  She does report that she got dialysis on Friday and is supposed get it tomorrow.  The history is provided by the patient.    HPI: A 69 year old patient with a history of peripheral artery disease and hypertension presents for evaluation of chest pain. Initial onset of pain was approximately 1-3 hours ago. The patient's chest pain is described as heaviness/pressure/tightness, is not worse with exertion and is relieved by nitroglycerin. The patient's chest pain is middle- or left-sided, is not well-localized, is not sharp and does radiate to the arms/jaw/neck. The patient does not complain of nausea and denies diaphoresis. The patient has no history of stroke, has not smoked in the past 90 days, denies any history of treated diabetes, has no relevant family history of coronary artery disease (first  degree relative at less than age 76), has no history of hypercholesterolemia and does not have an elevated BMI (>=30).   Past Medical History:  Diagnosis Date  . Adenomatous polyp of colon 10/2010, 2006, 2015  . Anemia in CKD (chronic kidney disease) 11/07/2012   s/p blood transfusion.   . Arthritis   . CAD (coronary artery disease)    "something like that"  . CHF (congestive heart failure) ([place])   . Constipation   . Depression with anxiety   . Diverticula, colon   . 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  Had some early rejection then stable function for years, then had slow decline of function and went back on hemodialysis in 2012.  Gets HD TTS schedule at [facility] on [facility] still using L forearm AVF.     Marland Kitchen GERD (gastroesophageal reflux disease)   . GI bleed 2017   felt to be ischemic colitis, last colo 2015  . Headache   . Hyperlipidemia   . Hypertension   . Neurologic gait dysfunction   . Neuromuscular disorder (HCC)    neuropathy hand and legs  . Osteoporosis   . Pneumonia   . 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 Pulmonary edema 12/2019  . Stroke [facility]) 11/2015   TIA  . Weight loss, unintentional  Patient Active Problem List   Diagnosis Date Noted  . Hypotension after procedure 03/16/2020  . Chest pain 03/16/2020  . Change in mental state 03/15/2020  . Ventricular tachycardia ([place]) 03/15/2020  . Seizure ([place]) 03/15/2020  . Unresponsive episode 03/15/2020  . Pulmonary edema 01/01/2020  . Complication of vascular access for dialysis 12/21/2019  . Breakdown (mechanical) of surgically created arteriovenous fistula, initial encounter ([place]) 12/18/2019  . Weakness 10/11/2019  . 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 Dependence on renal dialysis ([place]) 07/24/2019  . Iron deficiency anemia, unspecified 06/09/2019  . Age-related  osteoporosis without current pathological fracture 04/17/2019  . 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 Coagulation defect, unspecified ([place]) 04/17/2019  . Diarrhea, unspecified 04/17/2019  . Encounter for screening for depression 04/17/2019  . Headache, unspecified 04/17/2019  . 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 Pain, unspecified 04/17/2019  . Polyp of colon 04/17/2019  . Primary generalized (osteo)arthritis 04/17/2019  . Pruritus, unspecified 04/17/2019  . Pure hypercholesterolemia, unspecified 04/17/2019  . Secondary hyperparathyroidism of renal origin ([place]) 04/17/2019  . Gastro-esophageal reflux disease without esophagitis 04/17/2019  . Essential (primary) hypertension 04/17/2019  . Hyperlipidemia, unspecified 04/17/2019  . Transient cerebral ischemic attack, unspecified 04/17/2019  . Hypoxemia 12/13/2018  . Atrial fibrillation ([place]) 03/23/2017  . Prolonged Q-T interval on ECG 02/24/2017  . Hypokalemia 02/24/2017  . Acute ischemic stroke ([place]) 02/23/2017  . Malnutrition of moderate degree 12/24/2016  . Problem with dialysis access ([place]) 12/21/2016  . Acute ischemic colitis ([place]) 05/16/2016  . Colitis 05/15/2016  . Rectal bleeding 05/15/2016  . Neurologic abnormality 11/19/2015  . Hyperkalemia 11/19/2015  . Anxiety 11/19/2015  . Insomnia 11/19/2015  . Gait instability   . Stroke-like symptom 11/16/2015  . Vestibular neuritis 11/16/2015  . Stroke (cerebrum) ([place]) 11/16/2015  . Dizziness 05/09/2015  . Ataxia 05/09/2015  . H/O: CVA (cerebrovascular accident) 05/09/2015  . Left facial numbness 05/09/2015  . Left leg numbness 05/09/2015  . Hyperlipidemia   . Chronic diastolic CHF (congestive heart failure) ([place])   . Shortness of breath 04/01/2013  . HTN (hypertension) 04/01/2013  . Cholecystitis, acute 11/07/2012  . End-stage renal disease on hemodialysis ([place]) 11/07/2012  . Anemia in chronic kidney disease 11/07/2012    Past Surgical  History:  Procedure Laterality Date  . AV FISTULA PLACEMENT     for dialysis  . AV FISTULA PLACEMENT Left 11/22/2015   Procedure: ARTERIOVENOUS (AV) FISTULA CREATION-LEFT BRACHIOCEPHALIC;  Surgeon: Serafina Mitchell, MD;  Location: [place];  Service: Vascular;  Laterality: Left;  . AV FISTULA PLACEMENT Right 03/15/2020   Procedure: INSERTION OF ARTERIOVENOUS (AV) GORE-TEX GRAFT ARM ( BRACHIAL AXILLARY );  Surgeon: Katha Cabal, MD;  Location: [facility];  Service: Vascular;  Laterality: Right;  . BACK SURGERY    . CERVICAL FUSION    . CHOLECYSTECTOMY  12/02/2012   Procedure: LAPAROSCOPIC CHOLECYSTECTOMY WITH INTRAOPERATIVE CHOLANGIOGRAM;  Surgeon: Edward Jolly, MD;  Location: [address];  Service: General;  Laterality: N/A;  . EYE SURGERY Bilateral    cataract surgery  . EYE SURGERY Left 2019   laser  . HEMATOMA EVACUATION Left 12/24/2016   Procedure: EVACUATION HEMATOMA LEFT UPPER ARM;  Surgeon: Waynetta Sandy, MD;  Location: [facility];  Service: Vascular;  Laterality: Left;  . I & D EXTREMITY Left 12/31/2016   Procedure: IRRIGATION AND DEBRIDEMENT EXTREMITY;  Surgeon: Angelia Mould, MD;  Location: [place];  Service: Vascular;  Laterality: Left;  . INSERTION OF DIALYSIS CATHETER  Right 12/24/2016   Procedure: INSERTION OF DIALYSIS CATHETER;  Surgeon: Waynetta Sandy, MD;  Location: [facility];  Service: Vascular;  Laterality: Right;  . INSERTION OF DIALYSIS CATHETER Right 02/04/2017   Procedure: INSERTION OF DIALYSIS CATHETER;  Surgeon: Waynetta Sandy, MD;  Location: [place];  Service: Vascular;  Laterality: Right;  . KIDNEY TRANSPLANT  1996  . PERIPHERAL VASCULAR CATHETERIZATION Left 10/23/2016   Procedure: Fistulagram;  Surgeon: Elam Dutch, MD;  Location: [facility];  Service: Cardiovascular;  Laterality: Left;  . RESECTION OF ARTERIOVENOUS FISTULA ANEURYSM Left 11/22/2015   Procedure: RESECTION OF LEFT RADIOCEPHALIC FISTULA ANEURYSM ;  Surgeon: Serafina Mitchell, MD;  Location: [facility];  Service: Vascular;  Laterality: Left;  . REVISON OF ARTERIOVENOUS FISTULA Left 12/22/2016   Procedure: REVISON OF LEFT ARTERIOVENOUS FISTULA;  Surgeon: Waynetta Sandy, MD;  Location: [facility];  Service: Vascular;  Laterality: Left;  . REVISON OF ARTERIOVENOUS FISTULA Left 02/04/2017   Procedure: REVISON OF LEFT UPPER ARM ARTERIOVENOUS FISTULA;  Surgeon: Waynetta Sandy, MD;  Location: [place];  Service: Vascular;  Laterality: Left;  . UPPER EXTREMITY ANGIOGRAPHY Bilateral 09/19/2019   Procedure: UPPER EXTREMITY ANGIOGRAPHY;  Surgeon: Katha Cabal, MD;  Location: [place] [facility];  Service: Cardiovascular;  Laterality: Bilateral;     OB History   No obstetric history on file.     Family History  Problem Relation Age of Onset  . Colon cancer Brother   . Cancer Brother   . Coronary artery disease Mother 8  . Hyperlipidemia Mother   . Hypertension Mother   . Stroke Maternal Aunt   . Esophageal cancer Neg Hx   . Stomach cancer Neg Hx   . Rectal cancer Neg Hx     Social History   Tobacco Use  . Smoking status: Former Smoker    Types: Cigarettes    Quit date: 12/31/1991    Years since quitting: 28.2  . Smokeless tobacco: Never Used  Substance Use Topics  . Alcohol use: No    Alcohol/week: 0.0 standard drinks  . Drug use: No    Home Medications Prior to Admission medications   Medication Sig Start Date End Date Taking? Authorizing Provider  acetaminophen (TYLENOL) 500 MG tablet Take 1 tablet (500 mg total) by mouth every 6 (six) hours as needed. Patient taking differently: Take 500 mg by mouth every 6 (six) hours as needed for mild pain.  09/05/18  Yes Law, Bea Graff, PA-C  allopurinol (ZYLOPRIM) 300 MG tablet Take 150 mg by mouth daily.    Yes [provider]  ALPRAZolam (XANAX) 0.25 MG tablet Take 0.25 mg by mouth every other day as needed for anxiety.    Yes [provider]  AURYXIA 1 GM 210 MG(Fe)  tablet Take 420 mg by mouth 3 (three) times daily with meals.    Yes [provider]  B Complex-C-Folic Acid (DIALYVITE 485) 0.8 MG WAFR Take 1 Wafer by mouth daily. 10/25/18  Yes [provider]  cinacalcet (SENSIPAR) 30 MG tablet Take 30 mg by mouth daily. 01/04/20  Yes [provider]  HYDROcodone-acetaminophen (NORCO) 5-325 MG tablet Take 1-2 tablets by mouth every 6 (six) hours as needed for moderate pain or severe pain. 03/15/20  Yes Schnier, Dolores Lory, MD  labetalol (NORMODYNE) 200 MG tablet Take 0.5 tablets (100 mg total) by mouth 2 (two) times daily. 03/18/20  Yes Jennye Boroughs, MD  lactulose (CHRONULAC) 10 GM/15ML solution Take 20  g by mouth daily as needed for mild constipation.   Yes [provider]  ondansetron (ZOFRAN) 8 MG tablet Take 8 mg by mouth 2 (two) times daily as needed for nausea or vomiting.    Yes [provider]  zolpidem (AMBIEN) 10 MG tablet Take 10 mg by mouth at bedtime as needed for sleep.    Yes [provider]  simvastatin (ZOCOR) 20 MG tablet Take 1 tablet (20 mg total) by mouth at bedtime. Patient not taking: Reported on 03/18/2020 02/26/17   Velvet Bathe, MD    Allergies    Sulfa antibiotics and Adhesive [tape]  Review of Systems   Review of Systems  Constitutional: Negative for fever.  Respiratory: Positive for shortness of breath. Negative for cough.   Cardiovascular: Positive for chest pain.  Gastrointestinal: Negative for abdominal pain, nausea and vomiting.  Genitourinary: Negative for dysuria and hematuria.  Neurological: Negative for headaches.  All other systems reviewed and are negative.   Physical Exam Updated Vital Signs BP 133/82   Pulse 80   Temp 98.5 F (36.9 C) (Oral)   Resp 16   SpO2 100%   Physical Exam Vitals and nursing note reviewed.  Constitutional:      Appearance: Normal appearance. She is well-developed.  HENT:     Head: Normocephalic and atraumatic.  Eyes:      General: Lids are normal.     Conjunctiva/sclera: Conjunctivae normal.     Pupils: Pupils are equal, round, and reactive to light.  Cardiovascular:     Rate and Rhythm: Normal rate and regular rhythm.     Pulses: Normal pulses.     Heart sounds: Normal heart sounds. No murmur. No friction rub. No gallop.   Pulmonary:     Effort: Pulmonary effort is normal.     Breath sounds: Normal breath sounds.     Comments: Lungs clear to auscultation bilaterally.  Symmetric chest rise.  No wheezing, rales, rhonchi. Abdominal:     Palpations: Abdomen is soft. Abdomen is not rigid.     Tenderness: There is no abdominal tenderness. There is no guarding.  Musculoskeletal:        General: Normal range of motion.     Cervical back: Full passive range of motion without pain.  Skin:    General: Skin is warm and dry.     Capillary Refill: Capillary refill takes less than 2 seconds.  Neurological:     Mental Status: She is alert and oriented to person, place, and time.  Psychiatric:        Speech: Speech normal.     ED Results / Procedures / Treatments   Labs (all labs ordered are listed, but only abnormal results are displayed) Labs Reviewed  BASIC METABOLIC PANEL - Abnormal; Notable for the following components:      Result Value   Glucose, Bld 102 (*)    BUN 56 (*)    Creatinine, Ser 8.41 (*)    GFR calc non Af Amer 4 (*)    GFR calc Af Amer 5 (*)    Anion gap 17 (*)    All other components within normal limits  CBC - Abnormal; Notable for the following components:   RBC 3.13 (*)    Hemoglobin 9.6 (*)    HCT 30.0 (*)    RDW 16.9 (*)    All other components within normal limits  TROPONIN I (HIGH SENSITIVITY) - Abnormal; Notable for the following components:   Troponin I (High  Sensitivity) 42 (*)    All other components within normal limits  SARS CORONAVIRUS 2 (TAT 6-24 HRS)  TROPONIN I (HIGH SENSITIVITY)    EKG EKG Interpretation  Date/Time:  Monday April 01 2020 00:56:20  EDT Ventricular Rate:  80 PR Interval:  172 QRS Duration: 70 QT Interval:  406 QTC Calculation: 468 R Axis:   68 Text Interpretation: Normal sinus rhythm Anterior infarct , age undetermined Abnormal ECG When compared with ECG of EARLIER SAME DATE No significant change was found Confirmed by Delora Fuel ([id number]) on 04/01/2020 1:18:51 AM   Radiology DG Chest 2 View  Result Date: 04/01/2020 CLINICAL DATA:  Chest pain EXAM: CHEST - 2 VIEW COMPARISON:  03/15/2020 FINDINGS: Cardiomegaly. Aortic atherosclerosis. There is hyperinflation of the lungs compatible with COPD. Vascular congestion. Bibasilar opacities, likely atelectasis or scarring. No effusions or acute bony abnormality. IMPRESSION: COPD. Cardiomegaly, vascular congestion.  Aortic atherosclerosis. Bibasilar atelectasis or scarring. Electronically Signed   By: Rolm Baptise M.D.   On: 04/01/2020 01:17    Procedures Procedures (including critical care time)  Medications Ordered in ED Medications  sodium chloride flush (NS) 0.9 % injection 3 mL (3 mLs Intravenous Not Given 04/01/20 0416)  nitroGLYCERIN (NITROSTAT) SL tablet 0.4 mg (has no administration in time range)  simvastatin (ZOCOR) tablet 20 mg (has no administration in time range)  allopurinol (ZYLOPRIM) tablet 150 mg (has no administration in time range)  HYDROcodone-acetaminophen (NORCO/VICODIN) 5-325 MG per tablet 1-2 tablet (has no administration in time range)  labetalol (NORMODYNE) tablet 100 mg (has no administration in time range)  ALPRAZolam (XANAX) tablet 0.25 mg (has no administration in time range)  zolpidem (AMBIEN) tablet 10 mg (has no administration in time range)  cinacalcet (SENSIPAR) tablet 30 mg (has no administration in time range)  ferric citrate (AURYXIA) tablet 420 mg (has no administration in time range)  lactulose (CHRONULAC) 10 GM/15ML solution 20 g (has no administration in time range)  acetaminophen (TYLENOL) tablet 650 mg (has no administration in  time range)  ondansetron (ZOFRAN) injection 4 mg (has no administration in time range)  heparin injection 5,000 Units (has no administration in time range)    ED Course  I have reviewed the triage vital signs and the nursing notes.  Pertinent labs & imaging results that were available during my care of the patient were reviewed by me and considered in my medical decision making (see chart for details).    MDM Rules/Calculators/A&P HEAR Score: 10                    69 year old female who presents for evaluation of chest pain or shortness of breath that began 2 hours prior to ED arrival.  Reports symptoms improved with nitro.  Currently denies any pain. Patient is afebrile, non-toxic appearing, sitting comfortably on examination table. Vital signs reviewed and stable.  On exam, lungs clear to auscultation.  Patient is hemodynamically stable.  Concern for ACS etiology versus infectious etiology.  At this time, patient does not appear significantly fluid overloaded.  Labs ordered at triage.  Reviewed patient's chart.  She had been seen on 03/15/2020 for evaluation of vascular access.  She went to the postop and blood pressure dropped.  Additionally, her potassium was 6.1.  She started developing some chest pain and was noted to have some new T wave changes as well as some ST changes.  Her trope was slightly elevated at that time.  Hospitalist was consulted for admission.  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  During dialysis, patient had an episode where she became unresponsive and noted to have ventricular tachycardia of 160.  CODE BLUE was called.  She underwent immediate DC cardioversion and converted back to sinus rhythm.  This was presumed to be due to hyperkalemia.  BMP shows BUN of 56, creatinine of 8.41.  CBC with no leukocytosis.  Hemoglobin is 9.6.  Lance Bosch is 38.  Chest x-ray shows evidence of cardiomegaly, vascular  congestion.  Given patient's risk factors and history, she has a HEART score of 6. Plan for admission for chest pain rule out and trending troponins.   Discussed patient with Dr. Iona Coach (hospitalist). Will accept patient for admission. He asks that I discuss with cardiology to see if patient would be a candidate for cath.   Discussed patient with Dr. Hassell Done (Cardiology). Request medicine admission. Will have cards consult tomorrow during admission to determine if patient would be a candidate for cath.   Updated hospitalist on plan.   Portions of this note were generated with [employer]. Dictation errors may occur despite best attempts at proofreading.   Final Clinical Impression(s) / ED Diagnoses Final diagnoses:  Chest pain, unspecified type  Shortness of breath    Rx / DC Orders ED Discharge Orders    None       Desma Mcgregor 28/63/81 [id number]    Delora Fuel, MD 65/79/03 [phone]

## 2020-04-01 NOTE — ED Triage Notes (Addendum)
Per ems pt was laying in bed and started having chest pain with SOB about 2 hrs ago. Pt said radiates into her right jaw. No nausea, no vomiting.. pt is dialysis pt goes on Mon, Wedn, Friday.. Pt said some weakness all over.Pt said 3/10 pain was given 324 aspirin and 1 nitro by ems. Pain 0/10 now

## 2020-04-01 NOTE — ED Notes (Signed)
Pt given sips of water with med

## 2020-04-01 NOTE — ED Notes (Signed)
Pt c/o of nausea given meds

## 2020-04-01 NOTE — Consult Note (Addendum)
Cardiology Consultation:   Patient ID: [name]; [id number]; April 07, 1951   Admit date: 04/01/2020 Date of Consult: 04/01/2020  Primary Care Provider: [name], MD Primary Cardiologist: [name], MD 01/19/2020 Primary Electrophysiologist:  None   Patient Profile:   [name] is a 69 y.o. 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  History of Present Illness:   [name] was admitted 04/02-04/04/2020 for elective placement of RUE dialysis catheter. In the recovery unit, patient SBP 70s. Potassium of 6.1. Patient was given a fluid bolus of 1800 mL, started on a Neo-Synephrine infusion and transferred to the intensive care unit. Patient developed chest pain and was noted to have some ST-T wave changes  K+ peak 7.3, pt had sustained VT>>DCCV>>junctional rhythm>>narrow complex tachycardia (?reentrant)>>SR  Echo showed new wall motion abnormality possibly stress cardiomyopathy, felt 2nd stress of surgery and hypotension in the setting of hyperkalemia. Troponins were trended, peak 536 after DCCV for VT.   Chest fullness was felt 2nd IVF>>resolved w/ HD, no ischemic work-up was performed since she had a recent normal stress test.  Admitted 04/19 with CP, trop elevation, cards asked to see.  [name] states that she has not had chest pain since discharge until last night.  She dialyzes on Monday Wednesday and Friday.  She always has problems with increased shortness of breath on the weekends.  She does not feel that she drinks that much, but admits that she struggles with thirst.  She has been told by the Nephrologist that she is eating too much ice.  She started noticing worsening shortness of breath yesterday.  She was also struggling with fatigue.  Last night, she  was getting ready for bed, and felt very short of breath.  She got into bed and that is when she felt the chest pain.  She describes it as a pressure and says it was bad.  The shortness of breath and the chest pressure work bad enough that she felt like she needed to come to the hospital.  She was given 324 mg of aspirin and sublingual nitroglycerin x1.  Upon arrival to the emergency room, she was pain-free.  Currently, she is resting pretty comfortably on O2 and is not having any chest pain.  However, she says she gets short of breath with minimal activity, even turning over in the bed.   Past Medical History:  Diagnosis Date  . Adenomatous polyp of colon 10/2010, 2006, 2015  . Anemia in CKD (chronic kidney disease) 11/07/2012   s/p blood transfusion.   . Arthritis   . CAD (coronary artery disease)    "something like that"  . CHF (congestive heart failure) ([place])   . Constipation   . Depression with anxiety   . Diverticula, colon   . 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  Had some early rejection then stable function for years, then had slow decline of function and went back on hemodialysis in 2012.  Gets HD TTS schedule at [facility] still using L forearm AVF.     Marland Kitchen GERD (gastroesophageal reflux disease)   . GI bleed 2017   felt to be ischemic colitis, last colo 2015  . Headache   .  Hyperlipidemia   . Hypertension   . Neurologic gait dysfunction   . Neuromuscular disorder (HCC)    neuropathy hand and legs  . Osteoporosis   . Pneumonia   . 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 Pulmonary edema 12/2019  . Stroke [facility]) 11/2015   TIA  . Weight loss, unintentional     Past Surgical History:  Procedure Laterality Date  . AV FISTULA PLACEMENT     for dialysis  . AV FISTULA PLACEMENT Left 11/22/2015   Procedure: ARTERIOVENOUS (AV) FISTULA  CREATION-LEFT BRACHIOCEPHALIC;  Surgeon: Serafina Mitchell, MD;  Location: [place];  Service: Vascular;  Laterality: Left;  . AV FISTULA PLACEMENT Right 03/15/2020   Procedure: INSERTION OF ARTERIOVENOUS (AV) GORE-TEX GRAFT ARM ( BRACHIAL AXILLARY );  Surgeon: Katha Cabal, MD;  Location: [facility];  Service: Vascular;  Laterality: Right;  . BACK SURGERY    . CERVICAL FUSION    . CHOLECYSTECTOMY  12/02/2012   Procedure: LAPAROSCOPIC CHOLECYSTECTOMY WITH INTRAOPERATIVE CHOLANGIOGRAM;  Surgeon: Edward Jolly, MD;  Location: [place];  Service: General;  Laterality: N/A;  . EYE SURGERY Bilateral    cataract surgery  . EYE SURGERY Left 2019   laser  . HEMATOMA EVACUATION Left 12/24/2016   Procedure: EVACUATION HEMATOMA LEFT UPPER ARM;  Surgeon: Waynetta Sandy, MD;  Location: [place];  Service: Vascular;  Laterality: Left;  . I & D EXTREMITY Left 12/31/2016   Procedure: IRRIGATION AND DEBRIDEMENT EXTREMITY;  Surgeon: Angelia Mould, MD;  Location: [place];  Service: Vascular;  Laterality: Left;  . INSERTION OF DIALYSIS CATHETER Right 12/24/2016   Procedure: INSERTION OF DIALYSIS CATHETER;  Surgeon: Waynetta Sandy, MD;  Location: [place];  Service: Vascular;  Laterality: Right;  . INSERTION OF DIALYSIS CATHETER Right 02/04/2017   Procedure: INSERTION OF DIALYSIS CATHETER;  Surgeon: Waynetta Sandy, MD;  Location: [place];  Service: Vascular;  Laterality: Right;  . KIDNEY TRANSPLANT  1996  . PERIPHERAL VASCULAR CATHETERIZATION Left 10/23/2016   Procedure: Fistulagram;  Surgeon: Elam Dutch, MD;  Location: [place] CV [facility];  Service: Cardiovascular;  Laterality: Left;  . RESECTION OF ARTERIOVENOUS FISTULA ANEURYSM Left 11/22/2015   Procedure: RESECTION OF LEFT RADIOCEPHALIC FISTULA ANEURYSM ;  Surgeon: Serafina Mitchell, MD;  Location: [place];  Service: Vascular;  Laterality: Left;  . REVISON OF ARTERIOVENOUS FISTULA Left 12/22/2016   Procedure: REVISON OF LEFT  ARTERIOVENOUS FISTULA;  Surgeon: Waynetta Sandy, MD;  Location: [place];  Service: Vascular;  Laterality: Left;  . REVISON OF ARTERIOVENOUS FISTULA Left 02/04/2017   Procedure: REVISON OF LEFT UPPER ARM ARTERIOVENOUS FISTULA;  Surgeon: Waynetta Sandy, MD;  Location: [place];  Service: Vascular;  Laterality: Left;  . UPPER EXTREMITY ANGIOGRAPHY Bilateral 09/19/2019   Procedure: UPPER EXTREMITY ANGIOGRAPHY;  Surgeon: Katha Cabal, MD;  Location: [place] CV LAB;  Service: Cardiovascular;  Laterality: Bilateral;     Prior to Admission medications   Medication Sig Start Date End Date Taking? Authorizing Provider  acetaminophen (TYLENOL) 500 MG tablet Take 1 tablet (500 mg total) by mouth every 6 (six) hours as needed. Patient taking differently: Take 500 mg by mouth every 6 (six) hours as needed for mild pain.  09/05/18  Yes Law, Bea Graff, PA-C  allopurinol (ZYLOPRIM) 300 MG tablet Take 150 mg by mouth daily.    Yes [provider]  ALPRAZolam (XANAX) 0.25 MG tablet Take 0.25 mg by mouth  every other day as needed for anxiety.    Yes [provider]  AURYXIA 1 GM 210 MG(Fe) tablet Take 420 mg by mouth 3 (three) times daily with meals.    Yes [provider]  B Complex-C-Folic Acid (DIALYVITE 970) 0.8 MG WAFR Take 1 Wafer by mouth daily. 10/25/18  Yes [provider]  cinacalcet (SENSIPAR) 30 MG tablet Take 30 mg by mouth daily. 01/04/20  Yes [provider]  HYDROcodone-acetaminophen (NORCO) 5-325 MG tablet Take 1-2 tablets by mouth every 6 (six) hours as needed for moderate pain or severe pain. 03/15/20  Yes Schnier, Dolores Lory, MD  labetalol (NORMODYNE) 200 MG tablet Take 0.5 tablets (100 mg total) by mouth 2 (two) times daily. 03/18/20  Yes Jennye Boroughs, MD  lactulose (CHRONULAC) 10 GM/15ML solution Take 20 g by mouth daily as needed for mild constipation.   Yes [provider]  ondansetron (ZOFRAN) 8 MG tablet Take 8 mg by  mouth 2 (two) times daily as needed for nausea or vomiting.    Yes [provider]  zolpidem (AMBIEN) 10 MG tablet Take 10 mg by mouth at bedtime as needed for sleep.    Yes [provider]  simvastatin (ZOCOR) 20 MG tablet Take 1 tablet (20 mg total) by mouth at bedtime. Patient not taking: Reported on 03/18/2020 02/26/17   Velvet Bathe, MD    Inpatient Medications: Scheduled Meds: . allopurinol  150 mg Oral Daily  . Chlorhexidine Gluconate Cloth  6 each Topical Q0600  . cinacalcet  30 mg Oral Q breakfast  . ferric citrate  420 mg Oral TID WC  . heparin  5,000 Units Subcutaneous Q8H  . labetalol  100 mg Oral BID  . simvastatin  20 mg Oral QHS  . sodium chloride flush  3 mL Intravenous Once   Continuous Infusions:  PRN Meds: acetaminophen, ALPRAZolam, hydrALAZINE, HYDROcodone-acetaminophen, lactulose, nitroGLYCERIN, ondansetron (ZOFRAN) IV, zolpidem  Allergies:    Allergies  Allergen Reactions  . Sulfa Antibiotics Other (See Comments)    Both parents allergic-so will not take  . Adhesive [Tape] Itching    Social History:   Social History   Socioeconomic History  . Marital status: Widowed    Spouse name: Not on file  . Number of children: 2  . Years of education: Not on file  . Highest education level: Not on file  Occupational History  . Not on file  Tobacco Use  . Smoking status: Former Smoker    Types: Cigarettes    Quit date: 12/31/1991    Years since quitting: 28.2  . Smokeless tobacco: Never Used  Substance and Sexual Activity  . Alcohol use: No    Alcohol/week: 0.0 standard drinks  . Drug use: No  . Sexual activity: Not Currently  Other Topics Concern  . Not on file  Social History Narrative   Living with her mother    Right handed   Caffeine: only decaf clears ("no brown sodas" or coffee d/t kidney disease)   Low potassium foods d/t kidney disease   Social Determinants of Health   Financial Resource Strain:   . Difficulty of Paying  Living Expenses:   Food Insecurity:   . Worried About Charity fundraiser in the Last Year:   . Arboriculturist in the Last Year:   Transportation Needs:   . Film/video editor (Medical):   Marland Kitchen Lack of Transportation (Non-Medical):   Physical Activity:   . Days of Exercise per  Week:   . Minutes of Exercise per Session:   Stress:   . Feeling of Stress :   Social Connections:   . Frequency of Communication with Friends and Family:   . Frequency of Social Gatherings with Friends and Family:   . Attends Religious Services:   . Active Member of Clubs or Organizations:   . Attends Archivist Meetings:   Marland Kitchen Marital Status:   Intimate Partner Violence:   . Fear of Current or Ex-Partner:   . Emotionally Abused:   Marland Kitchen Physically Abused:   . Sexually Abused:     Family History:   Family History  Problem Relation Age of Onset  . Colon cancer Brother   . Cancer Brother   . Coronary artery disease Mother 59  . Hyperlipidemia Mother   . Hypertension Mother   . Stroke Maternal Aunt   . Esophageal cancer Neg Hx   . Stomach cancer Neg Hx   . Rectal cancer Neg Hx    Family Status:  Family Status  Relation Name Status  . Brother  Alive  . Mother  Alive  . Father  Deceased  . [employer]  . Sister  Alive  . Neg Hx  (Not Specified)    ROS:  Please see the history of present illness.  All other ROS reviewed and negative.     Physical Exam/Data:   Vitals:   04/01/20 0500 04/01/20 0530 04/01/20 0600 04/01/20 0802  BP: (!) 137/55 133/82 (!) 147/49 137/71  Pulse: 77 80 75 71  Resp: 15 16 19 18   Temp:      TempSrc:      SpO2: 99% 100% 98% 100%   No intake or output data in the 24 hours ending 04/01/20 1133  Last 3 Weights 03/18/2020 03/18/2020 03/17/2020  Weight (lbs) 112 lb 12.8 oz 112 lb 12.8 oz 116 lb 9.6 oz  Weight (kg) 51.166 kg 51.166 kg 52.889 kg     There is no height or weight on file to calculate BMI.   General:  Well nourished, well developed, female in  no acute distress HEENT: normal Lymph: no adenopathy Neck: JVD - 12 cm Endocrine:  No thryomegaly Vascular: No carotid bruits; 3/4 extremity pulses 2+. Decreased LUE pulse noted but old LUE fistula with good pulse, RUE fistula w/out signs of infection Cardiac:  normal S1, S2; RRR; 2/6 systolic murmur plus continuous hum (likely 2nd fistulae) Lungs: rales bases bilaterally, no wheezing, rhonchi Abd: soft, nontender, no hepatomegaly  Ext: no edema Musculoskeletal:  No deformities, BUE and BLE strength normal and equal Skin: warm and dry  Neuro:  CNs 2-12 intact, no focal abnormalities noted Psych:  Normal affect   EKG:  The EKG was personally reviewed and demonstrates:  04/19 ECG is SR, HR 80, T wave inversions V1-3; T wave inversions are improved from 04/03 Telemetry:  Telemetry was personally reviewed and demonstrates:  SR   CV studies:   ECHO: limited 03/15/2020 1. Left ventricular ejection fraction, by estimation, is 50 to 55%. The  left ventricle has low normal function. The left ventricle demonstrates  regional wall motion abnormalities (distal anteroseptal and apical region,  concerning for conduction abnormality). There is moderate left ventricular hypertrophy. Left ventricular diastolic parameters are consistent with Grade I diastolic dysfunction (impaired relaxation). (No RWMA on 01/01/2020 echo) 2. Right ventricular systolic function is normal. The right ventricular  size is normal. There is severely elevated pulmonary artery systolic  pressure. The estimated right  ventricular systolic pressure is 78.4 mmHg.  3. Left atrial size was moderately dilated.  4. Right atrial size was mildly dilated.  5. Tricuspid valve regurgitation is moderate to severe.  6. The inferior vena cava is dilated in size with <50% respiratory  variability, suggesting right atrial pressure of 15 mmHg.   MYOVIEW: 02/20/2020  Nuclear stress EF: 69%.  There was no ST segment deviation noted  during stress.  The study is normal.  This is a low risk study.  The left ventricular ejection fraction is hyperdynamic (>65%).   CATH: none   Laboratory Data:   Chemistry Recent Labs  Lab 04/01/20 0054  NA 140  K 4.8  CL 98  CO2 25  GLUCOSE 102*  BUN 56*  CREATININE 8.41*  CALCIUM 9.5  GFRNONAA 4*  GFRAA 5*  ANIONGAP 17*    Lab Results  Component Value Date   ALT 137 (H) 03/15/2020   AST 290 (H) 03/15/2020   ALKPHOS 132 (H) 03/15/2020   BILITOT 2.0 (H) 03/15/2020   Hematology Recent Labs  Lab 04/01/20 0054  WBC 7.1  RBC 3.13*  HGB 9.6*  HCT 30.0*  MCV 95.8  MCH 30.7  MCHC 32.0  RDW 16.9*  PLT 261   Cardiac Enzymes High Sensitivity Troponin:   Recent Labs  Lab 03/15/20 1348 03/15/20 2320 04/01/20 0054 04/01/20 0254  TROPONINIHS 25* 536* 42* 51*      BNPNo results for input(s): BNP, PROBNP in the last 168 hours.   TSH:  Lab Results  Component Value Date   TSH 2.793 03/15/2020   Lipids: Lab Results  Component Value Date   CHOL 192 04/01/2020   HDL 45 04/01/2020   LDLCALC 121 (H) 04/01/2020   TRIG 131 04/01/2020   CHOLHDL 4.3 04/01/2020   HgbA1c: Lab Results  Component Value Date   HGBA1C 4.6 (L) 02/24/2017   Magnesium:  Magnesium  Date Value Ref Range Status  03/15/2020 2.5 (H) 1.7 - 2.4 mg/dL Final    Comment:    Performed at [facility], [address] [place], [place] [address]     Radiology/Studies:  DG Chest 2 View  Result Date: 04/01/2020 CLINICAL DATA:  Chest pain EXAM: CHEST - 2 VIEW COMPARISON:  03/15/2020 FINDINGS: Cardiomegaly. Aortic atherosclerosis. There is hyperinflation of the lungs compatible with COPD. Vascular congestion. Bibasilar opacities, likely atelectasis or scarring. No effusions or acute bony abnormality. IMPRESSION: COPD. Cardiomegaly, vascular congestion.  Aortic atherosclerosis. Bibasilar atelectasis or scarring. Electronically Signed   By: Rolm Baptise M.D.   On: 04/01/2020 01:17     Assessment and Plan:   1. Chest pain - minimal elevation of trop is of unclear significance in setting of ESRD - MV 02/2020 was w/out ischemia or scar, results above - On 04/02,  pt got hypotensive post-op >>IVF>>hyperkalemia up to 7.3>> VT>>DCCV >>trop >500>>echo +WMA - WMA & elevated trop most likely 2nd demand ischemia in the setting of acute illness and VT - however, pt at risk to have CAD, discuss w/ MD if cardiac CT would be helpful here.  2. ESRD on HD - Pt admits she gains wt between HD appts, more so over the weekend, has HD on Monday so Sunday is the worst.  - +volume overload by exam, volume mgt by HD  Otherwise, per IM, Nephrology Principal Problem:   Chest pain Active Problems:   End-stage renal disease on hemodialysis (HCC)   Anemia in chronic kidney disease   Hyperlipidemia   Chronic diastolic  CHF (congestive heart failure) ([place])   Essential (primary) hypertension  For questions or updates, please contact [facility] Please consult [email or web address] for contact info under Cardiology/STEMI.   Signed, Rosaria Ferries, PA-C  04/01/2020 11:33 AM  The patient was seen, examined and discussed with Rosaria Ferries, PA-C and I agree with the above.   69 y.o. 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 [name] was admitted 04/02-04/04/2020 for elective placement of RUE dialysis catheter. In the recovery unit, patient SBP 70s, K 6.1. Patient was given a fluid bolus of 1800 mL, started on a Neo-Synephrine infusion and transferred to the intensive care unit. Patient developed chest pain and was noted to have some ST-T wave changes. K+ peak 7.3, pt had sustained VT>>DCCV>>junctional rhythm>>narrow complex tachycardia >>SR Echo showed new wall motion abnormality possibly stress cardiomyopathy,  felt 2nd stress of surgery and hypotension in the setting of hyperkalemia. Troponins were trended, peak 536 after DCCV for VT. I have reviewed her chest CTA from 11/20/2019 and it shows diffuse calcifications in all 3 coronary arteries, however she recently underwent stress testing, nuclear stress test that I have personally reviewed and is a very good diagnostic quality and shows no prior infarct or ischemia.  LVEF over 65%.  She is admitted again with atypical chest pain, it appears to be recurrent pattern when she has longer stretch between dialysis usually on Sundays, patient does not seem to be compliant with fluid restriction in between dialysis.  She is currently asymptomatic, on physical exam she has no JVDs, S1-S2 with 3 out of 6 systolic murmur, lungs are clear, lower extremities have no edema and good peripheral pulses.  Her troponins are minimally elevated, 42 and 51, these are high-sensitivity troponins consistent with elevated troponin in the settings of stage V renal failure.  Her blood pressure slightly elevated, I would switch her labetalol to carvedilol 6.25 p.o. twice daily.  She should consider aggressive medical management with aspirin, beta-blocker and statins, she is currently on simvastatin 20 mg daily, however at the last visit her LFTs were elevated, this should be repeated.  No further ischemic work-up is indicated, the patient should proceed with dialysis today and can be discharged home.  Ena Dawley, MD 04/01/2020

## 2020-04-01 NOTE — Progress Notes (Signed)
PROGRESS NOTE    DORTHIE HOUSEKNECHT  WUJ:[id number] DOB: July 18, 1951 DOA: 04/01/2020 PCP: Renford Dills, MD   Brief Narrative:  69 year old with history of ESRD Monday Wednesday Friday, anemia of chronic disease, diastolic CHF, HTN, HLD, previous TIA admitted about a week ago for placement of right upper extremity graft by vascular surgery, hospital stay complicated by severe hypokalemia, ventricular tachycardia requiring cardioversion and ICU monitoring.  Eventually discharged home.  Prior to admission she developed mid substernal chest pain along with shortness of breath therefore brought to the hospital again for further evaluation.  Her troponins were elevated, cardiology team was consulted.   Assessment & Plan:   Principal Problem:   Chest pain Active Problems:   End-stage renal disease on hemodialysis (HCC)   Anemia in chronic kidney disease   Hyperlipidemia   Chronic diastolic CHF (congestive heart failure) (HCC)   Essential (primary) hypertension  Atypical chest pain -Previous recent history of ventricular tachycardia requiring cardioversion patient will require further ischemic evaluation -Echocardiogram 4/2-55% EF with some wall motion abnormality, grade 1 DD -Defer this to cardiology team -Supportive care serial cardiac enzymes -TSH normal earlier this month, check A1c and lipid panel  ESRD on hemodialysis Monday Wednesday Friday -Nephrology notified  Anemia of chronic disease -Hemoglobin stable continue to monitor  Essential hypertension -Resume home meds  Hyperlipidemia -Continue statin  Chronic diastolic congestive heart failure -Appears to be euvolemic in nature overall.  Cardiology team following.    DVT prophylaxis: Subcu heparin Code Status: Full code Family Communication:   Disposition Plan:   Patient From= home  Patient Anticipated D/C place= Home  Barriers= ongoing cardiac evaluation due to chest discomfort.  Maintain hospital stay until chest  pain resolves and appropriate cardiac appointment has been made.    Subjective: During my evaluation she reported of slight chest discomfort but greatly improved from earlier.  No other new complaints at this time.  Review of Systems Otherwise negative except as per HPI, including: General: Denies fever, chills, night sweats or unintended weight loss. Resp: Denies cough, wheezing, shortness of breath. Cardiac: Denies  palpitations, orthopnea, paroxysmal nocturnal dyspnea. GI: Denies abdominal pain, nausea, vomiting, diarrhea or constipation GU: Denies dysuria, frequency, hesitancy or incontinence MS: Denies muscle aches, joint pain or swelling Neuro: Denies headache, neurologic deficits (focal weakness, numbness, tingling), abnormal gait Psych: Denies anxiety, depression, SI/HI/AVH Skin: Denies new rashes or lesions ID: Denies sick contacts, exotic exposures, travel  Examination: Constitutional: Not in acute distress Respiratory: Clear to auscultation bilaterally Cardiovascular: Normal sinus rhythm, no rubs Abdomen: Nontender nondistended good bowel sounds Musculoskeletal: No edema noted Skin: No rashes seen Neurologic: CN 2-12 grossly intact.  And nonfocal Psychiatric: Normal judgment and insight. Alert and oriented x 3. Normal mood. Upper extremity fistula noted.  Objective: Vitals:   04/01/20 0600 04/01/20 0802 04/01/20 1100 04/01/20 1250  BP: (!) 147/49 137/71 133/84 (!) 146/44  Pulse: 75 71 71 69  Resp: 19 18 16 18   Temp:      TempSrc:      SpO2: 98% 100% 98% 96%   No intake or output data in the 24 hours ending 04/01/20 1346 There were no vitals filed for this visit.   Data Reviewed:   CBC: Recent Labs  Lab 04/01/20 0054  WBC 7.1  HGB 9.6*  HCT 30.0*  MCV 95.8  PLT 261   Basic Metabolic Panel: Recent Labs  Lab 04/01/20 0054  NA 140  K 4.8  CL 98  CO2 25  GLUCOSE 102*  BUN 56*  CREATININE 8.41*  CALCIUM 9.5   GFR: CrCl cannot be calculated  (Unknown ideal weight.). Liver Function Tests: No results for input(s): AST, ALT, ALKPHOS, BILITOT, PROT, ALBUMIN in the last 168 hours. No results for input(s): LIPASE, AMYLASE in the last 168 hours. No results for input(s): AMMONIA in the last 168 hours. Coagulation Profile: No results for input(s): INR, PROTIME in the last 168 hours. Cardiac Enzymes: No results for input(s): CKTOTAL, CKMB, CKMBINDEX, TROPONINI in the last 168 hours. BNP (last 3 results) No results for input(s): PROBNP in the last 8760 hours. HbA1C: No results for input(s): HGBA1C in the last 72 hours. CBG: No results for input(s): GLUCAP in the last 168 hours. Lipid Profile: Recent Labs    04/01/20 0620  CHOL 192  HDL 45  LDLCALC 121*  TRIG 131  CHOLHDL 4.3   Thyroid Function Tests: No results for input(s): TSH, T4TOTAL, FREET4, T3FREE, THYROIDAB in the last 72 hours. Anemia Panel: No results for input(s): VITAMINB12, FOLATE, FERRITIN, TIBC, IRON, RETICCTPCT in the last 72 hours. Sepsis Labs: No results for input(s): PROCALCITON, LATICACIDVEN in the last 168 hours.  Recent Results (from the past 240 hour(s))  SARS CORONAVIRUS 2 (TAT 6-24 HRS) Nasopharyngeal Nasopharyngeal Swab     Status: None   Collection Time: 04/01/20  4:53 AM   Specimen: Nasopharyngeal Swab  Result Value Ref Range Status   SARS Coronavirus 2 NEGATIVE NEGATIVE Final    Comment: (NOTE) SARS-CoV-2 target nucleic acids are NOT DETECTED. The SARS-CoV-2 RNA is generally detectable in upper and lower respiratory specimens during the acute phase of infection. Negative results do not preclude SARS-CoV-2 infection, do not rule out co-infections with other pathogens, and should not be used as the sole basis for treatment or other patient management decisions. Negative results must be combined with clinical observations, patient history, and epidemiological information. The expected result is Negative. Fact Sheet for  Patients: HairSlick.no Fact Sheet for Healthcare Providers: quierodirigir.com This test is not yet approved or cleared by the [place] FDA and  has been authorized for detection and/or diagnosis of SARS-CoV-2 by FDA under an Emergency Use Authorization (EUA). This EUA will remain  in effect (meaning this test can be used) for the duration of the COVID-19 declaration under Section 56 4(b)(1) of the Act, 21 U.S.C. section 360bbb-3(b)(1), unless the authorization is terminated or revoked sooner. Performed at [facility], [address], [place], [place] [address]          Radiology Studies: DG Chest 2 View  Result Date: 04/01/2020 CLINICAL DATA:  Chest pain EXAM: CHEST - 2 VIEW COMPARISON:  03/15/2020 FINDINGS: Cardiomegaly. Aortic atherosclerosis. There is hyperinflation of the lungs compatible with COPD. Vascular congestion. Bibasilar opacities, likely atelectasis or scarring. No effusions or acute bony abnormality. IMPRESSION: COPD. Cardiomegaly, vascular congestion.  Aortic atherosclerosis. Bibasilar atelectasis or scarring. Electronically Signed   By: Charlett Nose M.D.   On: 04/01/2020 01:17        Scheduled Meds: . allopurinol  150 mg Oral Daily  . Chlorhexidine Gluconate Cloth  6 each Topical Q0600  . cinacalcet  30 mg Oral Q breakfast  . ferric citrate  420 mg Oral TID WC  . heparin  5,000 Units Subcutaneous Q8H  . labetalol  100 mg Oral BID  . simvastatin  20 mg Oral QHS  . sodium chloride flush  3 mL Intravenous Once   Continuous Infusions:   LOS: 0 days   Time spent= 35 mins  Claudie Rathbone Joline Maxcy, MD [facility]  If 7PM-7AM, please contact night-coverage  04/01/2020, 1:46 PM

## 2020-04-01 NOTE — ED Notes (Signed)
MD Roxanne Mins made aware of IV team consult for access.

## 2020-04-01 NOTE — Progress Notes (Signed)
Pt. HD tx moved to 04/02/2020. Primary nurse Malachy Mood , RN made aware

## 2020-04-01 NOTE — ED Notes (Signed)
Spoke  To dialysis  , they are unble to  To get to pt now as they have only have 2 nurses

## 2020-04-01 NOTE — Consult Note (Signed)
Renal Service Consult Note [facility]  [name] 04/01/2020 [name] Requesting Physician:  Dr Reesa Chew  Reason for Consult:  ESRD pt w/ chest pain HPI: The patient is a 69 y.o. year-old w/ hx of CVA, PNA, HTN, HL, GI bleed, ESRD on HD MWF, depression/ anxiety, CAD/ CHF, anemia who presented last night w/ chest pain to ED, radiating to the neck. EMS gave asa/ sl ntg and CP improved. EKG showed no changes but troponin was up a bit at 42.  Pt was admitted. Asked to see for ESRD.    Recent admit 4/2 - 4/5 underwent new tunneled HD cath placement and post op became hypotensive.  K+ was 6.1, she got Neo gtt and NS bolus, admitted to ICU. Then had CP and EKG changes, echo per cards showed new wma. BP's improved w/ fluids. Pt had HD later which was complicated by VTach at 416, she retained a pulse. She had emergent DCCV which was successful to NSR at 70's. She had a brief seizure. She then recovered in ICU and was dc'd to floor then to home.   Pt currently is w/o complaints. No active CP or SOB or cough, no fevers, chills , sweats , no abd pain , no n/v/d.      ROS  denies CP  no joint pain   no HA  no blurry vision  no rash  no diarrhea  no nausea/ vomiting   Past Medical History  Past Medical History:  Diagnosis Date  . Adenomatous polyp of colon 10/2010, 2006, 2015  . Anemia in CKD (chronic kidney disease) 11/07/2012   s/p blood transfusion.   . Arthritis   . CAD (coronary artery disease)    "something like that"  . CHF (congestive heart failure) ([place])   . Constipation   . Depression with anxiety   . Diverticula, colon   . 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  Had some early rejection then stable function for years, then had slow decline of function and went back on hemodialysis in 2012.  Gets HD TTS schedule at [facility] on [facility] still using L forearm  AVF.     Marland Kitchen GERD (gastroesophageal reflux disease)   . GI bleed 2017   felt to be ischemic colitis, last colo 2015  . Headache   . Hyperlipidemia   . Hypertension   . Neurologic gait dysfunction   . Neuromuscular disorder (HCC)    neuropathy hand and legs  . Osteoporosis   . Pneumonia   . 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 Pulmonary edema 12/2019  . Stroke [facility]) 11/2015   TIA  . Weight loss, unintentional    Past Surgical History  Past Surgical History:  Procedure Laterality Date  . AV FISTULA PLACEMENT     for dialysis  . AV FISTULA PLACEMENT Left 11/22/2015   Procedure: ARTERIOVENOUS (AV) FISTULA CREATION-LEFT BRACHIOCEPHALIC;  Surgeon: Serafina Mitchell, MD;  Location: [place];  Service: Vascular;  Laterality: Left;  . AV FISTULA PLACEMENT Right 03/15/2020   Procedure: INSERTION OF ARTERIOVENOUS (AV) GORE-TEX GRAFT ARM ( BRACHIAL AXILLARY );  Surgeon: Katha Cabal, MD;  Location: [facility];  Service: Vascular;  Laterality: Right;  . BACK SURGERY    . CERVICAL FUSION    . CHOLECYSTECTOMY  12/02/2012   Procedure: LAPAROSCOPIC CHOLECYSTECTOMY WITH INTRAOPERATIVE CHOLANGIOGRAM;  Surgeon: Edward Jolly, MD;  Location: [facility] OR;  Service: General;  Laterality: N/A;  . EYE SURGERY Bilateral    cataract surgery  . EYE SURGERY Left 2019   laser  . HEMATOMA EVACUATION Left 12/24/2016   Procedure: EVACUATION HEMATOMA LEFT UPPER ARM;  Surgeon: Waynetta Sandy, MD;  Location: [place];  Service: Vascular;  Laterality: Left;  . I & D EXTREMITY Left 12/31/2016   Procedure: IRRIGATION AND DEBRIDEMENT EXTREMITY;  Surgeon: Angelia Mould, MD;  Location: [place];  Service: Vascular;  Laterality: Left;  . INSERTION OF DIALYSIS CATHETER Right 12/24/2016   Procedure: INSERTION OF DIALYSIS CATHETER;  Surgeon: Waynetta Sandy, MD;  Location: [place];  Service: Vascular;  Laterality: Right;  . INSERTION OF DIALYSIS CATHETER Right 02/04/2017    Procedure: INSERTION OF DIALYSIS CATHETER;  Surgeon: Waynetta Sandy, MD;  Location: [place];  Service: Vascular;  Laterality: Right;  . KIDNEY TRANSPLANT  1996  . PERIPHERAL VASCULAR CATHETERIZATION Left 10/23/2016   Procedure: Fistulagram;  Surgeon: Elam Dutch, MD;  Location: [place] [facility];  Service: Cardiovascular;  Laterality: Left;  . RESECTION OF ARTERIOVENOUS FISTULA ANEURYSM Left 11/22/2015   Procedure: RESECTION OF LEFT RADIOCEPHALIC FISTULA ANEURYSM ;  Surgeon: Serafina Mitchell, MD;  Location: [place];  Service: Vascular;  Laterality: Left;  . REVISON OF ARTERIOVENOUS FISTULA Left 12/22/2016   Procedure: REVISON OF LEFT ARTERIOVENOUS FISTULA;  Surgeon: Waynetta Sandy, MD;  Location: [place];  Service: Vascular;  Laterality: Left;  . REVISON OF ARTERIOVENOUS FISTULA Left 02/04/2017   Procedure: REVISON OF LEFT UPPER ARM ARTERIOVENOUS FISTULA;  Surgeon: Waynetta Sandy, MD;  Location: [place];  Service: Vascular;  Laterality: Left;  . UPPER EXTREMITY ANGIOGRAPHY Bilateral 09/19/2019   Procedure: UPPER EXTREMITY ANGIOGRAPHY;  Surgeon: Katha Cabal, MD;  Location: [place] [facility];  Service: Cardiovascular;  Laterality: Bilateral;   Family History  Family History  Problem Relation Age of Onset  . Colon cancer Brother   . Cancer Brother   . Coronary artery disease Mother 72  . Hyperlipidemia Mother   . Hypertension Mother   . Stroke Maternal Aunt   . Esophageal cancer Neg Hx   . Stomach cancer Neg Hx   . Rectal cancer Neg Hx    Social History  reports that she quit smoking about 28 years ago. Her smoking use included cigarettes. She has never used smokeless tobacco. She reports that she does not drink alcohol or use drugs. Allergies  Allergies  Allergen Reactions  . Sulfa Antibiotics Other (See Comments)    Both parents allergic-so will not take  . Adhesive [Tape] Itching   Home medications Prior to Admission medications   Medication Sig  Start Date End Date Taking? Authorizing Provider  acetaminophen (TYLENOL) 500 MG tablet Take 1 tablet (500 mg total) by mouth every 6 (six) hours as needed. Patient taking differently: Take 500 mg by mouth every 6 (six) hours as needed for mild pain.  09/05/18  Yes Law, Bea Graff, PA-C  allopurinol (ZYLOPRIM) 300 MG tablet Take 150 mg by mouth daily.    Yes [provider]  ALPRAZolam (XANAX) 0.25 MG tablet Take 0.25 mg by mouth every other day as needed for anxiety.    Yes [provider]  AURYXIA 1 GM 210 MG(Fe) tablet Take 420 mg by mouth 3 (three) times daily with meals.    Yes [provider]  B Complex-C-Folic Acid (DIALYVITE 989) 0.8 MG WAFR Take  1 Wafer by mouth daily. 10/25/18  Yes [provider]  cinacalcet (SENSIPAR) 30 MG tablet Take 30 mg by mouth daily. 01/04/20  Yes [provider]  HYDROcodone-acetaminophen (NORCO) 5-325 MG tablet Take 1-2 tablets by mouth every 6 (six) hours as needed for moderate pain or severe pain. 03/15/20  Yes Schnier, Dolores Lory, MD  labetalol (NORMODYNE) 200 MG tablet Take 0.5 tablets (100 mg total) by mouth 2 (two) times daily. 03/18/20  Yes Jennye Boroughs, MD  lactulose (CHRONULAC) 10 GM/15ML solution Take 20 g by mouth daily as needed for mild constipation.   Yes [provider]  ondansetron (ZOFRAN) 8 MG tablet Take 8 mg by mouth 2 (two) times daily as needed for nausea or vomiting.    Yes [provider]  zolpidem (AMBIEN) 10 MG tablet Take 10 mg by mouth at bedtime as needed for sleep.    Yes [provider]  simvastatin (ZOCOR) 20 MG tablet Take 1 tablet (20 mg total) by mouth at bedtime. Patient not taking: Reported on 03/18/2020 02/26/17   Velvet Bathe, MD     Vitals:   04/01/20 0600 04/01/20 0802 04/01/20 1100 04/01/20 1250  BP: (!) 147/49 137/71 133/84 (!) 146/44  Pulse: 75 71 71 69  Resp: 19 18 16 18   Temp:      TempSrc:      SpO2: 98% 100% 98% 96%   Exam Gen alert  pleasant elderly AAF no distress No rash, cyanosis or gangrene Sclera anicteric, throat clear  No jvd or bruits Chest clear bilat to bases no rales, wheezing or bronchial BS RRR no MRG, +transmitted bruit from L avf Abd soft ntnd no mass or ascites +bs GU defer MS no joint effusions or deformity Ext trace pretib edema, no wounds or ulcers Neuro is alert, Ox 3 , nf  LUA AVF+/ RUA AVG + bruit    Home meds:  - labetalol 100 bid  - sensipar 30 qd/ auryxia 420 tid ac  - lactulose 20 gm qd prn  - statin/ norco prn/ xanax prn/ ambien prn  - prn's/ vitamins/ supplements    Outpt HD:    3h 75min  350/1.5  49.5kg  2/2 bath  LUA AVF (RUA AVG maturing)  Hep none  - hect 5 ug  - mircera 50 q 4 wks     Assessment/ Plan: 1. Chest pain / + troponin - per pmd/ cardiology 2. ESRD - on HD MWF.  HD today or may be postponed d/t high census now. Using the L arm aVF until AVG in R arm is ready.  3. HTN/ vol - BP's good, on RA, no ^vol on exam 4. MBC ckd - cont meds, Ca 9.5 5.  Anemia ckd - Hb 9.6, getting mircera as OP q 4 wks      Kelly Splinter  MD 04/01/2020, 2:46 PM  Recent Labs  Lab 04/01/20 0054  WBC 7.1  HGB 9.6*   Recent Labs  Lab 04/01/20 0054  K 4.8  BUN 56*  CREATININE 8.41*  CALCIUM 9.5

## 2020-04-02 DIAGNOSIS — I10 Essential (primary) hypertension: Secondary | ICD-10-CM | POA: Diagnosis not present

## 2020-04-02 DIAGNOSIS — E782 Mixed hyperlipidemia: Secondary | ICD-10-CM | POA: Diagnosis not present

## 2020-04-02 DIAGNOSIS — I2510 Atherosclerotic heart disease of native coronary artery without angina pectoris: Secondary | ICD-10-CM | POA: Diagnosis not present

## 2020-04-02 DIAGNOSIS — I509 Heart failure, unspecified: Secondary | ICD-10-CM | POA: Diagnosis not present

## 2020-04-02 DIAGNOSIS — I132 Hypertensive heart and chronic kidney disease with heart failure and with stage 5 chronic kidney disease, or end stage renal disease: Secondary | ICD-10-CM | POA: Diagnosis not present

## 2020-04-02 DIAGNOSIS — R079 Chest pain, unspecified: Secondary | ICD-10-CM | POA: Diagnosis not present

## 2020-04-02 DIAGNOSIS — I214 Non-ST elevation (NSTEMI) myocardial infarction: Secondary | ICD-10-CM | POA: Diagnosis not present

## 2020-04-02 DIAGNOSIS — Z992 Dependence on renal dialysis: Secondary | ICD-10-CM | POA: Diagnosis not present

## 2020-04-02 DIAGNOSIS — N186 End stage renal disease: Secondary | ICD-10-CM | POA: Diagnosis not present

## 2020-04-02 DIAGNOSIS — D631 Anemia in chronic kidney disease: Secondary | ICD-10-CM | POA: Diagnosis not present

## 2020-04-02 LAB — CBC
HCT: 27.9 % — ABNORMAL LOW (ref 36.0–46.0)
Hemoglobin: 8.8 g/dL — ABNORMAL LOW (ref 12.0–15.0)
MCH: 29.7 pg (ref 26.0–34.0)
MCHC: 31.5 g/dL (ref 30.0–36.0)
MCV: 94.3 fL (ref 80.0–100.0)
Platelets: 254 10*3/uL (ref 150–400)
RBC: 2.96 MIL/uL — ABNORMAL LOW (ref 3.87–5.11)
RDW: 16.7 % — ABNORMAL HIGH (ref 11.5–15.5)
WBC: 5.7 10*3/uL (ref 4.0–10.5)
nRBC: 0 % (ref 0.0–0.2)

## 2020-04-02 LAB — RENAL FUNCTION PANEL
Albumin: 3.1 g/dL — ABNORMAL LOW (ref 3.5–5.0)
Anion gap: 15 (ref 5–15)
BUN: 76 mg/dL — ABNORMAL HIGH (ref 8–23)
CO2: 24 mmol/L (ref 22–32)
Calcium: 8.7 mg/dL — ABNORMAL LOW (ref 8.9–10.3)
Chloride: 98 mmol/L (ref 98–111)
Creatinine, Ser: 10.6 mg/dL — ABNORMAL HIGH (ref 0.44–1.00)
GFR calc Af Amer: 4 mL/min — ABNORMAL LOW (ref >60)
GFR calc non Af Amer: 3 mL/min — ABNORMAL LOW (ref >60)
Glucose, Bld: 94 mg/dL (ref 70–99)
Phosphorus: 5.2 mg/dL — ABNORMAL HIGH (ref 2.5–4.6)
Potassium: 5 mmol/L (ref 3.5–5.1)
Sodium: 137 mmol/L (ref 135–145)

## 2020-04-02 LAB — MAGNESIUM: Magnesium: 2.5 mg/dL — ABNORMAL HIGH (ref 1.7–2.4)

## 2020-04-02 LAB — HEMOGLOBIN A1C
Hgb A1c MFr Bld: 5.4 % (ref 4.8–5.6)
Mean Plasma Glucose: 108.28 mg/dL

## 2020-04-02 MED ORDER — ATORVASTATIN CALCIUM 40 MG PO TABS: 40.0000 mg | ORAL_TABLET | Freq: Every day | ORAL | 0 refills | Status: DC

## 2020-04-02 MED ORDER — CARVEDILOL 6.25 MG PO TABS: 6.2500 mg | ORAL_TABLET | Freq: Two times a day (BID) | ORAL | 0 refills | Status: DC

## 2020-04-02 MED FILL — ATORVASTATIN CALCIUM 40 MG: 40 | 30 days supply | Qty: 30 | Fill #0

## 2020-04-02 MED FILL — CARVEDILOL 6.25 MG TABLET: 6.25 | 30 days supply | Qty: 60 | Fill #0

## 2020-04-02 NOTE — Progress Notes (Signed)
Spoke with dialysis and they will draw AM labs for 04/02/20.

## 2020-04-02 NOTE — Progress Notes (Signed)
[facility] Progress Note  Subjective: seen on HD this am, no new c/o  Vitals:   04/02/20 1100 04/02/20 1130 04/02/20 1200 04/02/20 1210  BP: (!) 147/66 (!) 144/72 (!) 174/64 (!) 152/72  Pulse: 68 68 67 68  Resp:      Temp:    98.9 F (37.2 C)  TempSrc:      SpO2:      Weight:        Exam: Gen alert pleasant elderly AAF No jvd or bruits Chest clear bilat to bases RRR no MRG, +transmitted bruit from L avf Abd soft ntnd no mass or ascites +bs Ext trace pretib edema Neuro is alert, Ox 3 , nf  LUA AVF+/ RUA AVG + bruit    Home meds:  - labetalol 100 bid  - sensipar 30 qd/ auryxia 420 tid ac  - lactulose 20 gm qd prn  - statin/ norco prn/ xanax prn/ ambien prn  - prn's/ vitamins/ supplements    Outpt HD: MWF HD   3h 64min  350/1.5  49.5kg  2/2 bath  LUA AVF (RUA AVG maturing)  Hep none  - hect 5 ug  - mircera 50 q 4 wks     Assessment/ Plan: 1. Chest pain / + troponin - per pmd/ cardiology 2. ESRD - on HD MWF.  HD today off schedule, rollover from Monday.  Using the L arm aVF until AVG in R arm is ready.  3. HTN/ vol - BP's good, on RA, no ^vol on exam 4. MBC ckd - cont meds, Ca 9.5 5.  Anemia ckd - Hb 9.6, getting mircera as OP q 4 wks 6. Dispo - for dc today     Rob Baron Parmelee 04/02/2020, 12:43 PM   Recent Labs  Lab 04/01/20 0054 04/02/20 0915 04/02/20 0916  K 4.8 5.0  --   BUN 56* 76*  --   CREATININE 8.41* 10.60*  --   CALCIUM 9.5 8.7*  --   PHOS  --  5.2*  --   HGB 9.6*  --  8.8*   Inpatient medications: . allopurinol  150 mg Oral Daily  . carvedilol  6.25 mg Oral BID WC  . Chlorhexidine Gluconate Cloth  6 each Topical Q0600  . cinacalcet  30 mg Oral Q breakfast  . ferric citrate  420 mg Oral TID WC  . heparin  5,000 Units Subcutaneous Q8H  . simvastatin  20 mg Oral QHS  . sodium chloride flush  3 mL Intravenous Once    acetaminophen, ALPRAZolam, hydrALAZINE, HYDROcodone-acetaminophen, lactulose, nitroGLYCERIN,  ondansetron (ZOFRAN) IV, zolpidem

## 2020-04-02 NOTE — Discharge Summary (Signed)
Physician Discharge Summary  [name] [id number]:[id number] DOB: 1951-01-01 DOA: 04/01/2020  PCP: [name], MD  Admit date: 04/01/2020 Discharge date: 04/02/2020  Admitted From: Disposition:    Recommendations for Outpatient Follow-up:  1. Follow up with PCP in 1-2 weeks 2. Please obtain BMP/CBC in one week your next doctors visit.  3. Simvastatin changed to Lipitor 40 mg daily 4. Labetalol changed to Coreg 6.25 mg twice daily 5. Follow-up outpatient cardiology in about 4 weeks. 6. Resume outpatient routine dialysis   Discharge Condition: Stable CODE STATUS: Full code Diet recommendation: Cardiac/renal  Brief/Interim Summary: 69 year old with history of ESRD Monday Wednesday Friday, anemia of chronic disease, diastolic CHF, HTN, HLD, previous TIA admitted about a week ago for placement of right upper extremity graft by vascular surgery, hospital stay complicated by severe hypokalemia, ventricular tachycardia requiring cardioversion and ICU monitoring.  Eventually discharged home.  Prior to admission she developed mid substernal chest pain along with shortness of breath therefore brought to the hospital again for further evaluation.  Her troponins were elevated, cardiology team was consulted.  Patient was dialyzed by nephrology service.  Cardiology did not recommend any further inpatient work-up at this time.  Labetalol will change to Coreg twice daily.  Due to elevated LDL simvastatin was changed to Lipitor 40 mg daily. Tolerated HD session in the hospital, stable for discharge post HD today   Assessment & Plan:    Atypical chest pain, resolved -Previous recent history of ventricular tachycardia requiring cardioversion patient will require further ischemic evaluation -Echocardiogram 4/2-55% EF with some wall motion abnormality, grade 1 DD -LDL - 121; simvastatin changed to Lipitor 40 mg.  A1c 5.4 -Seen by cardiology, follow-up outpatient.  No further ischemic evaluation  indicated at this time -Labetalol switched to Coreg  ESRD on hemodialysis Monday Wednesday Friday -Dialysis per nephrology today  Anemia of chronic disease -Hemoglobin stable continue to monitor  Essential hypertension -Resume home meds  Hyperlipidemia -Continue statin  Chronic diastolic congestive heart failure -Appears to be euvolemic in nature overall.  Cardiology team following.    Discharge Diagnoses:  Principal Problem:   Chest pain Active Problems:   End-stage renal disease on hemodialysis (HCC)   Anemia in chronic kidney disease   Hyperlipidemia   Chronic diastolic CHF (congestive heart failure) ([place])   Essential (primary) hypertension    Consultations:  Cardiology  Nephrology  Subjective: Feels okay no complaints.  Discharge Exam: Vitals:   04/02/20 1015 04/02/20 1045  BP: (!) 133/38 (!) 158/91  Pulse: (!) 57 68  Resp:    Temp:    SpO2:     Vitals:   04/02/20 0915 04/02/20 0945 04/02/20 1015 04/02/20 1045  BP: (!) 188/53 126/67 (!) 133/38 (!) 158/91  Pulse: 70 69 (!) 57 68  Resp:      Temp:      TempSrc:      SpO2:      Weight:        General: Pt is alert, awake, not in acute distress, chronically ill-appearing Cardiovascular: RRR, S1/S2 +, no rubs, no gallops, systolic murmur Respiratory: CTA bilaterally, no wheezing, no rhonchi Abdominal: Soft, NT, ND, bowel sounds + Extremities: no edema, no cyanosis  Discharge Instructions   Allergies as of 04/02/2020      Reactions   Sulfa Antibiotics Other (See Comments)   Both parents allergic-so will not take   Adhesive [tape] Itching      Medication List    STOP taking these medications   labetalol 200  MG tablet Commonly known as: NORMODYNE   simvastatin 20 MG tablet Commonly known as: ZOCOR     TAKE these medications   acetaminophen 500 MG tablet Commonly known as: TYLENOL Take 1 tablet (500 mg total) by mouth every 6 (six) hours as needed. What changed: reasons to take  this   allopurinol 300 MG tablet Commonly known as: ZYLOPRIM Take 150 mg by mouth daily.   ALPRAZolam 0.25 MG tablet Commonly known as: XANAX Take 0.25 mg by mouth every other day as needed for anxiety.   atorvastatin 40 MG tablet Commonly known as: Lipitor Take 1 tablet (40 mg total) by mouth daily.   Auryxia 1 GM 210 MG(Fe) tablet Generic drug: ferric citrate Take 420 mg by mouth 3 (three) times daily with meals.   carvedilol 6.25 MG tablet Commonly known as: COREG Take 1 tablet (6.25 mg total) by mouth 2 (two) times daily with a meal.   cinacalcet 30 MG tablet Commonly known as: SENSIPAR Take 30 mg by mouth daily.   Dialyvite 800 0.8 MG Wafr Take 1 Wafer by mouth daily.   HYDROcodone-acetaminophen 5-325 MG tablet Commonly known as: Norco Take 1-2 tablets by mouth every 6 (six) hours as needed for moderate pain or severe pain.   lactulose 10 GM/15ML solution Commonly known as: CHRONULAC Take 20 g by mouth daily as needed for mild constipation.   ondansetron 8 MG tablet Commonly known as: ZOFRAN Take 8 mg by mouth 2 (two) times daily as needed for nausea or vomiting.   zolpidem 10 MG tablet Commonly known as: AMBIEN Take 10 mg by mouth at bedtime as needed for sleep.      Follow-up Information    [name], MD. Schedule an appointment as soon as possible for a visit in 1 week.   Specialty: Internal Medicine Why: Office will call patient  Contact information: [address] [place] [address] [phone]        [name], MD.   Specialties: Cardiology, Radiology Contact information: [address] [place] [address] [phone]          Allergies  Allergen Reactions  . Sulfa Antibiotics Other (See Comments)    Both parents allergic-so will not take  . Adhesive [Tape] Itching    You were cared for by a hospitalist during your hospital stay. If you have any questions about your discharge medications  or the care you received while you were in the hospital after you are discharged, you can call the unit and asked to speak with the hospitalist on call if the hospitalist that took care of you is not available. Once you are discharged, your primary care physician will handle any further medical issues. Please note that no refills for any discharge medications will be authorized once you are discharged, as it is imperative that you return to your primary care physician (or establish a relationship with a primary care physician if you do not have one) for your aftercare needs so that they can reassess your need for medications and monitor your lab values.   Procedures/Studies: DG Chest 2 View  Result Date: 04/01/2020 CLINICAL DATA:  Chest pain EXAM: CHEST - 2 VIEW COMPARISON:  03/15/2020 FINDINGS: Cardiomegaly. Aortic atherosclerosis. There is hyperinflation of the lungs compatible with COPD. Vascular congestion. Bibasilar opacities, likely atelectasis or scarring. No effusions or acute bony abnormality. IMPRESSION: COPD. Cardiomegaly, vascular congestion.  Aortic atherosclerosis. Bibasilar atelectasis or scarring. Electronically Signed   By: Rolm Baptise  M.D.   On: 04/01/2020 01:17   DG Chest Portable 1 View  Result Date: 03/15/2020 CLINICAL DATA:  Shortness of breath EXAM: PORTABLE CHEST 1 VIEW COMPARISON:  Portable exam 1102 hours compared to 01/01/2020 FINDINGS: Enlargement of cardiac silhouette with pulmonary vascular congestion. Atherosclerotic calcification aorta. Peribronchial thickening with accentuated interstitial markings likely representing pulmonary edema and CHF, slightly improved from previous study. No pleural effusion or pneumothorax. Bones demineralized with degenerative changes of the thoracic spine and probable chronic RIGHT rotator cuff tear. IMPRESSION: Enlargement of cardiac silhouette with vascular congestion and probable pulmonary edema, slightly improved. Electronically Signed   By:  Lavonia Dana M.D.   On: 03/15/2020 11:10   [place] OR NERVE BLOCK-IMAGE ONLY [facility])  Result Date: 03/15/2020 There is no interpretation for this exam.  This order is for images obtained during a surgical procedure.  Please See "Surgeries" Tab for more information regarding the procedure.   ECHOCARDIOGRAM LIMITED  Result Date: 03/15/2020    ECHOCARDIOGRAM LIMITED REPORT   Patient Name:   TUYET BADER Date of Exam: 03/15/2020 Medical Rec #:  [id number]         Height:       62.0 in Accession #:    [id number]        Weight:       112.0 lb Date of Birth:  1951-08-02         BSA:          1.494 m Patient Age:    36 years          BP:           119/55 mmHg Patient Gender: F                 HR:           62 bpm. Exam Location:  [facility] Procedure: 2D Echo, Cardiac Doppler and Color Doppler Indications:     Acute Respiratory Insufficiency  History:         Patient has prior history of Echocardiogram examinations. CHF,                  CAD; Risk Factors:Hypertension.  Sonographer:     Alyse Low Roar Referring Phys:  [id number] CARMEN Veda Canning Diagnosing Phys: Ida Rogue MD IMPRESSIONS  1. Left ventricular ejection fraction, by estimation, is 50 to 55%. The left ventricle has low normal function. The left ventricle demonstrates regional wall motion abnormalities (distal anteroseptal and apical region, concerning for condution abnormality). There is moderate left ventricular hypertrophy. Left ventricular diastolic parameters are consistent with Grade I diastolic dysfunction (impaired relaxation).  2. Right ventricular systolic function is normal. The right ventricular size is normal. There is severely elevated pulmonary artery systolic pressure. The estimated right ventricular systolic pressure is 90.3 mmHg.  3. Left atrial size was moderately dilated.  4. Right atrial size was mildly dilated.  5. Tricuspid valve regurgitation is moderate to severe.  6. The inferior vena cava is dilated in size with <50% respiratory variability,  suggesting right atrial pressure of 15 mmHg. FINDINGS  Left Ventricle: Left ventricular ejection fraction, by estimation, is 50 to 55%. The left ventricle has low normal function. The left ventricle demonstrates regional wall motion abnormalities. The left ventricular internal cavity size was normal in size. There is moderate left ventricular hypertrophy. Right Ventricle: The right ventricular size is normal. No increase in right ventricular wall thickness. Right ventricular systolic function is normal. There is severely elevated pulmonary artery systolic pressure.  The tricuspid regurgitant velocity is 3.86 m/s, and with an assumed right atrial pressure of 15 mmHg, the estimated right ventricular systolic pressure is 83.2 mmHg. Left Atrium: Left atrial size was moderately dilated. Right Atrium: Right atrial size was mildly dilated. Pericardium: There is no evidence of pericardial effusion. Mitral Valve: The mitral valve is normal in structure. Normal mobility of the mitral valve leaflets. No evidence of mitral valve stenosis. Tricuspid Valve: The tricuspid valve is normal in structure. Tricuspid valve regurgitation is moderate to severe. No evidence of tricuspid stenosis. Aortic Valve: The aortic valve is normal in structure. Aortic valve regurgitation is not visualized. Mild to moderate aortic valve sclerosis/calcification is present, without any evidence of aortic stenosis. Aortic valve mean gradient measures 7.3 mmHg. Aortic valve peak gradient measures 13.9 mmHg. Aortic valve area, by VTI measures 2.26 cm. Pulmonic Valve: The pulmonic valve was normal in structure. Pulmonic valve regurgitation is not visualized. No evidence of pulmonic stenosis. Aorta: The aortic root is normal in size and structure. Venous: The inferior vena cava is dilated in size with less than 50% respiratory variability, suggesting right atrial pressure of 15 mmHg. IAS/Shunts: No atrial level shunt detected by color flow Doppler.  LEFT  VENTRICLE PLAX 2D LVIDd:         3.49 cm  Diastology LVIDs:         2.42 cm  LV e' lateral:   7.29 cm/s LV PW:         1.39 cm  LV E/e' lateral: 11.5 LV IVS:        1.54 cm  LV e' medial:    5.44 cm/s LVOT diam:     1.80 cm  LV E/e' medial:  15.4 LV SV:         86 LV SV Index:   58 LVOT Area:     2.54 cm  RIGHT VENTRICLE RV Mid diam:    4.29 cm RV S prime:     12.50 cm/s TAPSE (M-mode): 2.4 cm LEFT ATRIUM             Index       RIGHT ATRIUM           Index LA diam:        3.30 cm 2.21 cm/m  RA Area:     23.10 cm LA Vol (A2C):   82.5 ml 55.21 ml/m RA Volume:   77.40 ml  51.80 ml/m LA Vol (A4C):   43.3 ml 28.98 ml/m LA Biplane Vol: 60.5 ml 40.49 ml/m  AORTIC VALVE                    PULMONIC VALVE AV Area (Vmax):    1.99 cm     PV Vmax:        0.86 m/s AV Area (Vmean):   2.02 cm     PV Peak grad:   2.9 mmHg AV Area (VTI):     2.26 cm     RVOT Peak grad: 1 mmHg AV Vmax:           186.33 cm/s AV Vmean:          124.667 cm/s AV VTI:            0.380 m AV Peak Grad:      13.9 mmHg AV Mean Grad:      7.3 mmHg LVOT Vmax:         146.00 cm/s LVOT Vmean:  99.000 cm/s LVOT VTI:          0.338 m LVOT/AV VTI ratio: 0.89  AORTA Ao Root diam: 2.50 cm MITRAL VALVE               TRICUSPID VALVE MV Area (PHT): 2.74 cm    TR Peak grad:   59.6 mmHg MV Decel Time: 277 msec    TR Vmax:        386.00 cm/s MV E velocity: 83.90 cm/s MV A velocity: 98.50 cm/s  SHUNTS MV E/A ratio:  0.85        Systemic VTI:  0.34 m                            Systemic Diam: 1.80 cm Ida Rogue MD Electronically signed by Ida Rogue MD Signature Date/Time: 03/15/2020/5:03:50 PM    Final       The results of significant diagnostics from this hospitalization (including imaging, microbiology, ancillary and laboratory) are listed below for reference.     Microbiology: Recent Results (from the past 240 hour(s))  SARS CORONAVIRUS 2 (TAT 6-24 HRS) Nasopharyngeal Nasopharyngeal Swab     Status: None   Collection Time: 04/01/20  4:53  AM   Specimen: Nasopharyngeal Swab  Result Value Ref Range Status   SARS Coronavirus 2 NEGATIVE NEGATIVE Final    Comment: (NOTE) SARS-CoV-2 target nucleic acids are NOT DETECTED. The SARS-CoV-2 RNA is generally detectable in upper and lower respiratory specimens during the acute phase of infection. Negative results do not preclude SARS-CoV-2 infection, do not rule out co-infections with other pathogens, and should not be used as the sole basis for treatment or other patient management decisions. Negative results must be combined with clinical observations, patient history, and epidemiological information. The expected result is Negative. Fact Sheet for Patients: SugarRoll.be Fact Sheet for Healthcare Providers: https://www.woods-mathews.com/ This test is not yet approved or cleared by the [place] FDA and  has been authorized for detection and/or diagnosis of SARS-CoV-2 by FDA under an Emergency Use Authorization (EUA). This EUA will remain  in effect (meaning this test can be used) for the duration of the COVID-19 declaration under Section 56 4(b)(1) of the Act, 21 U.S.C. section 360bbb-3(b)(1), unless the authorization is terminated or revoked sooner. Performed at [facility], [place] [address], [place], [place] [address]      Labs: BNP (last 3 results) Recent Labs    11/20/19 0430 01/01/20 0539  BNP 3,362.4* 0,174.9*   Basic Metabolic Panel: Recent Labs  Lab 04/01/20 0054 04/02/20 0915  NA 140 137  K 4.8 5.0  CL 98 98  CO2 25 24  GLUCOSE 102* 94  BUN 56* 76*  CREATININE 8.41* 10.60*  CALCIUM 9.5 8.7*  MG  --  2.5*  PHOS  --  5.2*   Liver Function Tests: Recent Labs  Lab 04/01/20 1600 04/02/20 0915  AST 20  --   ALT 8  --   ALKPHOS 95  --   BILITOT 0.8  --   PROT 6.7  --   ALBUMIN 3.2* 3.1*   No results for input(s): LIPASE, AMYLASE in the last 168 hours. No results for input(s): AMMONIA in the last  168 hours. CBC: Recent Labs  Lab 04/01/20 0054 04/02/20 0916  WBC 7.1 5.7  HGB 9.6* 8.8*  HCT 30.0* 27.9*  MCV 95.8 94.3  PLT 261 254   Cardiac Enzymes: No results for input(s): CKTOTAL, CKMB,  CKMBINDEX, TROPONINI in the last 168 hours. BNP: Invalid input(s): POCBNP CBG: No results for input(s): GLUCAP in the last 168 hours. D-Dimer No results for input(s): DDIMER in the last 72 hours. Hgb A1c Recent Labs    04/02/20 0916  HGBA1C 5.4   Lipid Profile Recent Labs    04/01/20 0620  CHOL 192  HDL 45  LDLCALC 121*  TRIG 131  CHOLHDL 4.3   Thyroid function studies No results for input(s): TSH, T4TOTAL, T3FREE, THYROIDAB in the last 72 hours.  Invalid input(s): FREET3 Anemia work up No results for input(s): VITAMINB12, FOLATE, FERRITIN, TIBC, IRON, RETICCTPCT in the last 72 hours. Urinalysis    Component Value Date/Time   COLORURINE YELLOW 01/30/2011 1648   APPEARANCEUR CLOUDY (A) 01/30/2011 1648   LABSPEC 1.016 01/30/2011 1648   PHURINE 5.0 01/30/2011 1648   GLUCOSEU NEGATIVE 10/14/2009 0747   HGBUR SMALL (A) 01/30/2011 1648   BILIRUBINUR NEGATIVE 01/30/2011 1648   KETONESUR NEGATIVE 01/30/2011 1648   PROTEINUR 100 (A) 01/30/2011 1648   UROBILINOGEN 0.2 01/30/2011 1648   NITRITE NEGATIVE 01/30/2011 1648   LEUKOCYTESUR SMALL (A) 01/30/2011 1648   Sepsis Labs Invalid input(s): PROCALCITONIN,  WBC,  LACTICIDVEN Microbiology Recent Results (from the past 240 hour(s))  SARS CORONAVIRUS 2 (TAT 6-24 HRS) Nasopharyngeal Nasopharyngeal Swab     Status: None   Collection Time: 04/01/20  4:53 AM   Specimen: Nasopharyngeal Swab  Result Value Ref Range Status   SARS Coronavirus 2 NEGATIVE NEGATIVE Final    Comment: (NOTE) SARS-CoV-2 target nucleic acids are NOT DETECTED. The SARS-CoV-2 RNA is generally detectable in upper and lower respiratory specimens during the acute phase of infection. Negative results do not preclude SARS-CoV-2 infection, do not rule  out co-infections with other pathogens, and should not be used as the sole basis for treatment or other patient management decisions. Negative results must be combined with clinical observations, patient history, and epidemiological information. The expected result is Negative. Fact Sheet for Patients: SugarRoll.be Fact Sheet for Healthcare Providers: https://www.woods-mathews.com/ This test is not yet approved or cleared by the [place] FDA and  has been authorized for detection and/or diagnosis of SARS-CoV-2 by FDA under an Emergency Use Authorization (EUA). This EUA will remain  in effect (meaning this test can be used) for the duration of the COVID-19 declaration under Section 56 4(b)(1) of the Act, 21 U.S.C. section 360bbb-3(b)(1), unless the authorization is terminated or revoked sooner. Performed at [facility], [place] [address], [place], [place] [address]      Time coordinating discharge:  I have spent 35 minutes face to face with the patient and on the ward discussing the patients care, assessment, plan and disposition with other care givers. >50% of the time was devoted counseling the patient about the risks and benefits of treatment/Discharge disposition and coordinating care.   SIGNED:   Damita Lack, MD  [facility] 04/02/2020, 11:32 AM   If 7PM-7AM, please contact night-coverage

## 2020-04-02 NOTE — Care Management Obs Status (Signed)
MEDICARE OBSERVATION STATUS NOTIFICATION   Patient Details  Name: [name] MRN: [id number] Date of Birth: July 30, 1951   Medicare Observation Status Notification Given:  Yes    Zenon Mayo, RN 04/02/2020, 9:56 AM

## 2020-04-02 NOTE — Progress Notes (Signed)
Nsg Discharge Note  Admit Date:  04/01/2020 Discharge date: 04/02/2020   [name] to be D/C'd home per MD order.  AVS completed.  Patient/caregiver able to verbalize understanding.  Discharge Medication: Allergies as of 04/02/2020      Reactions   Sulfa Antibiotics Other (See Comments)   Both parents allergic-so will not take   Adhesive [tape] Itching      Medication List    STOP taking these medications   labetalol 200 MG tablet Commonly known as: NORMODYNE   simvastatin 20 MG tablet Commonly known as: ZOCOR     TAKE these medications   acetaminophen 500 MG tablet Commonly known as: TYLENOL Take 1 tablet (500 mg total) by mouth every 6 (six) hours as needed. What changed: reasons to take this   allopurinol 300 MG tablet Commonly known as: ZYLOPRIM Take 150 mg by mouth daily.   ALPRAZolam 0.25 MG tablet Commonly known as: XANAX Take 0.25 mg by mouth every other day as needed for anxiety.   atorvastatin 40 MG tablet Commonly known as: Lipitor Take 1 tablet (40 mg total) by mouth daily.   Auryxia 1 GM 210 MG(Fe) tablet Generic drug: ferric citrate Take 420 mg by mouth 3 (three) times daily with meals.   carvedilol 6.25 MG tablet Commonly known as: COREG Take 1 tablet (6.25 mg total) by mouth 2 (two) times daily with a meal.   cinacalcet 30 MG tablet Commonly known as: SENSIPAR Take 30 mg by mouth daily.   Dialyvite 800 0.8 MG Wafr Take 1 Wafer by mouth daily.   HYDROcodone-acetaminophen 5-325 MG tablet Commonly known as: Norco Take 1-2 tablets by mouth every 6 (six) hours as needed for moderate pain or severe pain.   lactulose 10 GM/15ML solution Commonly known as: CHRONULAC Take 20 g by mouth daily as needed for mild constipation.   ondansetron 8 MG tablet Commonly known as: ZOFRAN Take 8 mg by mouth 2 (two) times daily as needed for nausea or vomiting.   zolpidem 10 MG tablet Commonly known as: AMBIEN Take 10 mg by mouth at bedtime as  needed for sleep.       Discharge Assessment: Vitals:   04/02/20 1210 04/02/20 1326  BP: (!) 152/72 (!) 150/104  Pulse: 68 72  Resp:  18  Temp: 98.9 F (37.2 C) 97.9 F (36.6 C)  SpO2:  98%   Skin clean, dry and intact without evidence of skin break down, no evidence of skin tears noted. IV catheter discontinued intact. Site without signs and symptoms of complications - no redness or edema noted at insertion site, patient denies c/o pain - only slight tenderness at site.  Dressing with slight pressure applied.  D/c Instructions-Education: Discharge instructions given to patient/family with verbalized understanding. D/c education completed with patient/family including follow up instructions, medication list, d/c activities limitations if indicated, with other d/c instructions as indicated by MD - patient able to verbalize understanding, all questions fully answered. Patient instructed to return to ED, call 911, or call MD for any changes in condition.  Patient escorted via [place], and D/C home via private auto.  Atilano Ina, RN 04/02/2020 1:59 PM

## 2020-04-03 DIAGNOSIS — D631 Anemia in chronic kidney disease: Secondary | ICD-10-CM | POA: Diagnosis not present

## 2020-04-03 DIAGNOSIS — N186 End stage renal disease: Secondary | ICD-10-CM | POA: Diagnosis not present

## 2020-04-03 DIAGNOSIS — Z992 Dependence on renal dialysis: Secondary | ICD-10-CM | POA: Diagnosis not present

## 2020-04-03 DIAGNOSIS — N2581 Secondary hyperparathyroidism of renal origin: Secondary | ICD-10-CM | POA: Diagnosis not present

## 2020-04-04 ENCOUNTER — Other Ambulatory Visit: Payer: Self-pay

## 2020-04-04 ENCOUNTER — Ambulatory Visit (INDEPENDENT_AMBULATORY_CARE_PROVIDER_SITE_OTHER): Payer: Medicare Other

## 2020-04-04 ENCOUNTER — Ambulatory Visit (INDEPENDENT_AMBULATORY_CARE_PROVIDER_SITE_OTHER): Payer: Medicare Other | Admitting: Vascular Surgery

## 2020-04-04 ENCOUNTER — Encounter (INDEPENDENT_AMBULATORY_CARE_PROVIDER_SITE_OTHER): Payer: Self-pay | Admitting: Vascular Surgery

## 2020-04-04 ENCOUNTER — Other Ambulatory Visit (INDEPENDENT_AMBULATORY_CARE_PROVIDER_SITE_OTHER): Payer: Self-pay | Admitting: Vascular Surgery

## 2020-04-04 ENCOUNTER — Ambulatory Visit (INDEPENDENT_AMBULATORY_CARE_PROVIDER_SITE_OTHER): Payer: Medicare Other | Admitting: Cardiology

## 2020-04-04 ENCOUNTER — Encounter: Payer: Self-pay | Admitting: Cardiology

## 2020-04-04 VITALS — BP 173/88 | HR 89 | Resp 14 | Ht 62.0 in | Wt 109.0 lb

## 2020-04-04 VITALS — HR 90 | Ht 62.0 in | Wt 111.0 lb

## 2020-04-04 DIAGNOSIS — N186 End stage renal disease: Secondary | ICD-10-CM

## 2020-04-04 DIAGNOSIS — E7800 Pure hypercholesterolemia, unspecified: Secondary | ICD-10-CM

## 2020-04-04 DIAGNOSIS — T829XXS Unspecified complication of cardiac and vascular prosthetic device, implant and graft, sequela: Secondary | ICD-10-CM

## 2020-04-04 DIAGNOSIS — Z95828 Presence of other vascular implants and grafts: Secondary | ICD-10-CM

## 2020-04-04 DIAGNOSIS — Z9889 Other specified postprocedural states: Secondary | ICD-10-CM | POA: Diagnosis not present

## 2020-04-04 DIAGNOSIS — I10 Essential (primary) hypertension: Secondary | ICD-10-CM

## 2020-04-04 DIAGNOSIS — R079 Chest pain, unspecified: Secondary | ICD-10-CM | POA: Diagnosis not present

## 2020-04-04 NOTE — Progress Notes (Signed)
Cardiology Office Note:    Date:  04/04/2020   ID:  [name], DOB August 25, 1951, MRN [id number]  PCP:  [name], MD  Cardiologist:  [name], MD  Electrophysiologist:  None   Referring MD: [name], MD   Chief Complaint  Patient presents with  . Other    Hospital follow up -04/01/2020 - Chest pain. Meds reviewed verbally with patient.     History of Present Illness:    [name] is a 69 y.o. female with a hx of hypertension, hyperlipidemia, TIA, end-stage renal disease on hemodialysis who presents for hospital follow-up.  She was admitted to the hospital on 04/01/2020 [facility] for right upper extremity graft placement.  Hospital course complicated by hyperkalemia, K 7.3 and ventricular tachycardia requiring cardioversion.  Echocardiogram showed on 03/2020, showed some conduction/motion abnormalities deemed stress induced, impaired relaxation.  EF 50 to 55%, Myoview on 02/2020 was normal, low risk study.  Labetalol was changed to Coreg twice daily.  Simvastatin switch to Lipitor.  Patient tolerated dialysis and subsequently discharged in stable condition.  Since discharge, patient has felt okay, no further episodes of palpitations or chest discomfort.  She has her right AV graft made, waiting for it to mature prior to usage.  Blood pressure could not be taken on the right arm.  The left AV fistula is dilated and blood pressure not taken on that arm during today's office either.   Past Medical History:  Diagnosis Date  . Adenomatous polyp of colon 10/2010, 2006, 2015  . Anemia in CKD (chronic kidney disease) 11/07/2012   s/p blood transfusion.   . Arthritis   . CAD (coronary artery disease)    "something like that"  . CHF (congestive heart failure) ([place])   . Constipation   . Depression with anxiety   . Diverticula, colon   . 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  Had  some early rejection then stable function for years, then had slow decline of function and went back on hemodialysis in 2012.  Gets HD TTS schedule at [facility] on [facility] still using L forearm AVF.     Marland Kitchen GERD (gastroesophageal reflux disease)   . GI bleed 2017   felt to be ischemic colitis, last colo 2015  . Headache   . Hyperlipidemia   . Hypertension   . Neurologic gait dysfunction   . Neuromuscular disorder (HCC)    neuropathy hand and legs  . Osteoporosis   . Pneumonia   . 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 Pulmonary edema 12/2019  . Stroke [facility]) 11/2015   TIA  . Weight loss, unintentional     Past Surgical History:  Procedure Laterality Date  . AV FISTULA PLACEMENT     for dialysis  . AV FISTULA PLACEMENT Left 11/22/2015   Procedure: ARTERIOVENOUS (AV) FISTULA CREATION-LEFT BRACHIOCEPHALIC;  Surgeon: Serafina Mitchell, MD;  Location: [place];  Service: Vascular;  Laterality: Left;  . AV FISTULA PLACEMENT Right 03/15/2020   Procedure: INSERTION OF ARTERIOVENOUS (AV) GORE-TEX GRAFT ARM ( BRACHIAL AXILLARY );  Surgeon: Katha Cabal, MD;  Location: [facility];  Service: Vascular;  Laterality: Right;  . BACK SURGERY    . CERVICAL FUSION    . CHOLECYSTECTOMY  12/02/2012   Procedure: LAPAROSCOPIC CHOLECYSTECTOMY WITH INTRAOPERATIVE CHOLANGIOGRAM;  Surgeon: Edward Jolly, MD;  Location: [place];  Service: General;  Laterality: N/A;  . EYE SURGERY Bilateral    cataract surgery  . EYE SURGERY Left 2019   laser  . HEMATOMA EVACUATION Left 12/24/2016   Procedure: EVACUATION HEMATOMA LEFT UPPER ARM;  Surgeon: Waynetta Sandy, MD;  Location: [place];  Service: Vascular;  Laterality: Left;  . I & D EXTREMITY Left 12/31/2016   Procedure: IRRIGATION AND DEBRIDEMENT EXTREMITY;  Surgeon: Angelia Mould, MD;  Location: [place];  Service: Vascular;  Laterality: Left;  . INSERTION OF DIALYSIS CATHETER Right 12/24/2016   Procedure: INSERTION OF  DIALYSIS CATHETER;  Surgeon: Waynetta Sandy, MD;  Location: [place];  Service: Vascular;  Laterality: Right;  . INSERTION OF DIALYSIS CATHETER Right 02/04/2017   Procedure: INSERTION OF DIALYSIS CATHETER;  Surgeon: Waynetta Sandy, MD;  Location: [place];  Service: Vascular;  Laterality: Right;  . KIDNEY TRANSPLANT  1996  . PERIPHERAL VASCULAR CATHETERIZATION Left 10/23/2016   Procedure: Fistulagram;  Surgeon: Elam Dutch, MD;  Location: [place] CV LAB;  Service: Cardiovascular;  Laterality: Left;  . RESECTION OF ARTERIOVENOUS FISTULA ANEURYSM Left 11/22/2015   Procedure: RESECTION OF LEFT RADIOCEPHALIC FISTULA ANEURYSM ;  Surgeon: Serafina Mitchell, MD;  Location: [place];  Service: Vascular;  Laterality: Left;  . REVISON OF ARTERIOVENOUS FISTULA Left 12/22/2016   Procedure: REVISON OF LEFT ARTERIOVENOUS FISTULA;  Surgeon: Waynetta Sandy, MD;  Location: [place];  Service: Vascular;  Laterality: Left;  . REVISON OF ARTERIOVENOUS FISTULA Left 02/04/2017   Procedure: REVISON OF LEFT UPPER ARM ARTERIOVENOUS FISTULA;  Surgeon: Waynetta Sandy, MD;  Location: [place];  Service: Vascular;  Laterality: Left;  . UPPER EXTREMITY ANGIOGRAPHY Bilateral 09/19/2019   Procedure: UPPER EXTREMITY ANGIOGRAPHY;  Surgeon: Katha Cabal, MD;  Location: [place] CV LAB;  Service: Cardiovascular;  Laterality: Bilateral;    Current Medications: Current Meds  Medication Sig  . acetaminophen (TYLENOL) 500 MG tablet Take 1 tablet (500 mg total) by mouth every 6 (six) hours as needed. (Patient taking differently: Take 500 mg by mouth every 6 (six) hours as needed for mild pain. )  . allopurinol (ZYLOPRIM) 300 MG tablet Take 150 mg by mouth daily.   Marland Kitchen ALPRAZolam (XANAX) 0.25 MG tablet Take 0.25 mg by mouth every other day as needed for anxiety.   Marland Kitchen atorvastatin (LIPITOR) 40 MG tablet Take 1 tablet (40 mg total) by mouth daily.  Lorin Picket 1 GM 210 MG(Fe) tablet Take 420 mg by mouth 3  (three) times daily with meals.   . B Complex-C-Folic Acid (DIALYVITE 272) 0.8 MG WAFR Take 1 Wafer by mouth daily.  . carvedilol (COREG) 6.25 MG tablet Take 1 tablet (6.25 mg total) by mouth 2 (two) times daily with a meal.  . cinacalcet (SENSIPAR) 30 MG tablet Take 30 mg by mouth daily.  Marland Kitchen HYDROcodone-acetaminophen (NORCO) 5-325 MG tablet Take 1-2 tablets by mouth every 6 (six) hours as needed for moderate pain or severe pain.  Marland Kitchen lactulose (CHRONULAC) 10 GM/15ML solution Take 20 g by mouth daily as needed for mild constipation.  . ondansetron (ZOFRAN) 8 MG tablet Take 8 mg by mouth 2 (two) times daily as needed for nausea or vomiting.   Marland Kitchen zolpidem (AMBIEN) 10 MG tablet Take 10 mg by mouth at bedtime as needed for sleep.      Allergies:   Sulfa antibiotics and Adhesive [tape]   Social History   Socioeconomic History  . Marital status: Widowed    Spouse name:  Not on file  . Number of children: 2  . Years of education: Not on file  . Highest education level: Not on file  Occupational History  . Not on file  Tobacco Use  . Smoking status: Former Smoker    Types: Cigarettes    Quit date: 12/31/1991    Years since quitting: 28.2  . Smokeless tobacco: Never Used  Substance and Sexual Activity  . Alcohol use: No    Alcohol/week: 0.0 standard drinks  . Drug use: No  . Sexual activity: Not Currently  Other Topics Concern  . Not on file  Social History Narrative   Living with her mother    Right handed   Caffeine: only decaf clears ("no brown sodas" or coffee d/t kidney disease)   Low potassium foods d/t kidney disease   Social Determinants of Health   Financial Resource Strain:   . Difficulty of Paying Living Expenses:   Food Insecurity:   . Worried About Charity fundraiser in the Last Year:   . Arboriculturist in the Last Year:   Transportation Needs:   . Film/video editor (Medical):   Marland Kitchen Lack of Transportation (Non-Medical):   Physical Activity:   . Days of Exercise  per Week:   . Minutes of Exercise per Session:   Stress:   . Feeling of Stress :   Social Connections:   . Frequency of Communication with Friends and Family:   . Frequency of Social Gatherings with Friends and Family:   . Attends Religious Services:   . Active Member of Clubs or Organizations:   . Attends Archivist Meetings:   Marland Kitchen Marital Status:      Family History: The patient's family history includes Cancer in her brother; Colon cancer in her brother; Coronary artery disease (age of onset: 73) in her mother; Hyperlipidemia in her mother; Hypertension in her mother; Stroke in her maternal aunt. There is no history of Esophageal cancer, Stomach cancer, or Rectal cancer.  ROS:   Please see the history of present illness.     All other systems reviewed and are negative.  EKGs/Labs/Other Studies Reviewed:    The following studies were reviewed today:   EKG:  EKG is  ordered today.  The ekg ordered today demonstrates normal sinus rhythm, occasional PACs.  Recent Labs: 01/01/2020: B Natriuretic Peptide 1,664.3 03/15/2020: TSH 2.793 04/01/2020: ALT 8 04/02/2020: BUN 76; Creatinine, Ser 10.60; Hemoglobin 8.8; Magnesium 2.5; Platelets 254; Potassium 5.0; Sodium 137   Recent Lipid Panel    Component Value Date/Time   CHOL 192 04/01/2020 0620   TRIG 131 04/01/2020 0620   HDL 45 04/01/2020 0620   CHOLHDL 4.3 04/01/2020 0620   VLDL 26 04/01/2020 0620   LDLCALC 121 (H) 04/01/2020 0620    Physical Exam:    VS:  Pulse 90   Ht 5\' 2"  (1.575 m)   Wt 111 lb (50.3 kg)   SpO2 97%   BMI 20.30 kg/m     Wt Readings from Last 3 Encounters:  04/04/20 111 lb (50.3 kg)  04/02/20 114 lb 10.2 oz (52 kg)  03/18/20 112 lb 12.8 oz (51.2 kg)     GEN:  Well nourished, well developed in no acute distress HEENT: Normal NECK: No JVD; No carotid bruits LYMPHATICS: No lymphadenopathy CARDIAC: RRR, no murmurs, rubs, gallops, fistula murmur noted RESPIRATORY:  Clear to auscultation  without rales, wheezing or rhonchi  ABDOMEN: Soft, non-tender, non-distended MUSCULOSKELETAL:  No edema; left  arm fistula noted, right arm fistula noted. SKIN: Warm and dry NEUROLOGIC:  Alert and oriented x 3 PSYCHIATRIC:  Normal affect   ASSESSMENT:    1. Chest pain of uncertain etiology   2. Essential hypertension   3. Pure hypercholesterolemia    PLAN:    In order of problems listed above:  1. Patient with atypical chest discomfort.  Had some chest discomfort and palpitation with underlying hyperkalemia.  VT status post cardioversion due to hyperkalemia.  Has not had any recurrence chest pain or palpitations since.  Echocardiogram with preserved ejection fraction, impaired relaxation.  Myoview with no evidence of ischemia.  Currently asymptomatic.  Continue Coreg 6.25 twice daily.. 2. History of hypertension, continue Coreg 6.25 twice daily.  3. History of hyperlipidemia, continue Lipitor.  Follow-up in 6-12 months.  This note was generated in part or whole with voice recognition software. Voice recognition is usually quite accurate but there are transcription errors that can and very often do occur. I apologize for any typographical errors that were not detected and corrected.  Medication Adjustments/Labs and Tests Ordered: Current medicines are reviewed at length with the patient today.  Concerns regarding medicines are outlined above.  Orders Placed This Encounter  Procedures  . EKG 12-Lead   No orders of the defined types were placed in this encounter.   Patient Instructions  Medication Instructions:  - Your physician recommends that you continue on your current medications as directed. Please refer to the Current Medication list given to you today.  *If you need a refill on your cardiac medications before your next appointment, please call your pharmacy*   Lab Work: - none ordered  If you have labs (blood work) drawn today and your tests are completely normal, you  will receive your results only by: Marland Kitchen MyChart Message (if you have MyChart) OR . A paper copy in the mail If you have any lab test that is abnormal or we need to change your treatment, we will call you to review the results.   Testing/Procedures: - none ordered   Follow-Up: At [facility], you and your health needs are our priority.  As part of our continuing mission to provide you with exceptional heart care, we have created designated Provider Care Teams.  These Care Teams include your primary Cardiologist (physician) and Advanced Practice Providers (APPs -  Physician Assistants and Nurse Practitioners) who all work together to provide you with the care you need, when you need it.  We recommend signing up for the patient portal called "MyChart".  Sign up information is provided on this After Visit Summary.  MyChart is used to connect with patients for Virtual Visits (Telemedicine).  Patients are able to view lab/test results, encounter notes, upcoming appointments, etc.  Non-urgent messages can be sent to your provider as well.   To learn more about what you can do with MyChart, go to [email or web address].    Your next appointment:   6 month(s)  The format for your next appointment:   In Person  Provider:   [name], MD   Other Instructions n/a     Signed, [name], MD  04/04/2020 12:19 PM    [place]

## 2020-04-04 NOTE — Patient Instructions (Signed)
Medication Instructions:  - Your physician recommends that you continue on your current medications as directed. Please refer to the Current Medication list given to you today.  *If you need a refill on your cardiac medications before your next appointment, please call your pharmacy*   Lab Work: - none ordered  If you have labs (blood work) drawn today and your tests are completely normal, you will receive your results only by: Marland Kitchen MyChart Message (if you have MyChart) OR . A paper copy in the mail If you have any lab test that is abnormal or we need to change your treatment, we will call you to review the results.   Testing/Procedures: - none ordered   Follow-Up: At [facility], you and your health needs are our priority.  As part of our continuing mission to provide you with exceptional heart care, we have created designated Provider Care Teams.  These Care Teams include your primary Cardiologist (physician) and Advanced Practice Providers (APPs -  Physician Assistants and Nurse Practitioners) who all work together to provide you with the care you need, when you need it.  We recommend signing up for the patient portal called "MyChart".  Sign up information is provided on this After Visit Summary.  MyChart is used to connect with patients for Virtual Visits (Telemedicine).  Patients are able to view lab/test results, encounter notes, upcoming appointments, etc.  Non-urgent messages can be sent to your provider as well.   To learn more about what you can do with MyChart, go to [email or web address].    Your next appointment:   6 month(s)  The format for your next appointment:   In Person  Provider:   [name], MD   Other Instructions n/a

## 2020-04-04 NOTE — Progress Notes (Signed)
Patient ID: [name], female   DOB: 23-Jul-1951, 69 y.o.   MRN: [id number]  Chief Complaint  Patient presents with  . Follow-up    2 wk [facility]; hda     HPI [name] is a 69 y.o. female.   Patient denies right hand or right arm pain  She states the center "doesn't hear anything  Duplex ultrasound shows the right brachial ax graft is thrombosed  Past Medical History:  Diagnosis Date  . Adenomatous polyp of colon 10/2010, 2006, 2015  . Anemia in CKD (chronic kidney disease) 11/07/2012   s/p blood transfusion.   . Arthritis   . CAD (coronary artery disease)    "something like that"  . CHF (congestive heart failure) ([place])   . Constipation   . Depression with anxiety   . Diverticula, colon   . 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  Had some early rejection then stable function for years, then had slow decline of function and went back on hemodialysis in 2012.  Gets HD TTS schedule at [facility] on [facility] still using L forearm AVF.     Marland Kitchen GERD (gastroesophageal reflux disease)   . GI bleed 2017   felt to be ischemic colitis, last colo 2015  . Headache   . Hyperlipidemia   . Hypertension   . Neurologic gait dysfunction   . Neuromuscular disorder (HCC)    neuropathy hand and legs  . Osteoporosis   . Pneumonia   . 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 Pulmonary edema 12/2019  . Stroke [facility]) 11/2015   TIA  . Weight loss, unintentional     Past Surgical History:  Procedure Laterality Date  . AV FISTULA PLACEMENT     for dialysis  . AV FISTULA PLACEMENT Left 11/22/2015   Procedure: ARTERIOVENOUS (AV) FISTULA CREATION-LEFT BRACHIOCEPHALIC;  Surgeon: Serafina Mitchell, MD;  Location: [place];  Service: Vascular;  Laterality: Left;  . AV FISTULA PLACEMENT Right 03/15/2020   Procedure: INSERTION OF ARTERIOVENOUS (AV) GORE-TEX GRAFT ARM ( BRACHIAL  AXILLARY );  Surgeon: Katha Cabal, MD;  Location: [facility];  Service: Vascular;  Laterality: Right;  . BACK SURGERY    . CERVICAL FUSION    . CHOLECYSTECTOMY  12/02/2012   Procedure: LAPAROSCOPIC CHOLECYSTECTOMY WITH INTRAOPERATIVE CHOLANGIOGRAM;  Surgeon: Edward Jolly, MD;  Location: [place];  Service: General;  Laterality: N/A;  . EYE SURGERY Bilateral    cataract surgery  . EYE SURGERY Left 2019   laser  . HEMATOMA EVACUATION Left 12/24/2016   Procedure: EVACUATION HEMATOMA LEFT UPPER ARM;  Surgeon: Waynetta Sandy, MD;  Location: [place];  Service: Vascular;  Laterality: Left;  . I & D EXTREMITY Left 12/31/2016   Procedure: IRRIGATION AND DEBRIDEMENT EXTREMITY;  Surgeon: Angelia Mould, MD;  Location: [place];  Service: Vascular;  Laterality: Left;  . INSERTION OF DIALYSIS CATHETER Right 12/24/2016   Procedure: INSERTION OF DIALYSIS CATHETER;  Surgeon: Waynetta Sandy, MD;  Location: [place];  Service: Vascular;  Laterality: Right;  . INSERTION OF DIALYSIS CATHETER Right 02/04/2017   Procedure: INSERTION OF DIALYSIS CATHETER;  Surgeon: Waynetta Sandy, MD;  Location: [place];  Service: Vascular;  Laterality: Right;  . KIDNEY TRANSPLANT  1996  . PERIPHERAL VASCULAR CATHETERIZATION Left 10/23/2016   Procedure: Fistulagram;  Surgeon: Elam Dutch,  MD;  Location: [place] CV LAB;  Service: Cardiovascular;  Laterality: Left;  . RESECTION OF ARTERIOVENOUS FISTULA ANEURYSM Left 11/22/2015   Procedure: RESECTION OF LEFT RADIOCEPHALIC FISTULA ANEURYSM ;  Surgeon: Serafina Mitchell, MD;  Location: [place];  Service: Vascular;  Laterality: Left;  . REVISON OF ARTERIOVENOUS FISTULA Left 12/22/2016   Procedure: REVISON OF LEFT ARTERIOVENOUS FISTULA;  Surgeon: Waynetta Sandy, MD;  Location: [place];  Service: Vascular;  Laterality: Left;  . REVISON OF ARTERIOVENOUS FISTULA Left 02/04/2017   Procedure: REVISON OF LEFT UPPER ARM ARTERIOVENOUS FISTULA;  Surgeon:  Waynetta Sandy, MD;  Location: [place];  Service: Vascular;  Laterality: Left;  . UPPER EXTREMITY ANGIOGRAPHY Bilateral 09/19/2019   Procedure: UPPER EXTREMITY ANGIOGRAPHY;  Surgeon: Katha Cabal, MD;  Location: [place] CV LAB;  Service: Cardiovascular;  Laterality: Bilateral;      Allergies  Allergen Reactions  . Sulfa Antibiotics Other (See Comments)    Both parents allergic-so will not take  . Adhesive [Tape] Itching    Current Outpatient Medications  Medication Sig Dispense Refill  . acetaminophen (TYLENOL) 500 MG tablet Take 1 tablet (500 mg total) by mouth every 6 (six) hours as needed. (Patient taking differently: Take 500 mg by mouth every 6 (six) hours as needed for mild pain. ) 30 tablet 0  . allopurinol (ZYLOPRIM) 300 MG tablet Take 150 mg by mouth daily.     Marland Kitchen ALPRAZolam (XANAX) 0.25 MG tablet Take 0.25 mg by mouth every other day as needed for anxiety.     Marland Kitchen atorvastatin (LIPITOR) 40 MG tablet Take 1 tablet (40 mg total) by mouth daily. 30 tablet 0  . AURYXIA 1 GM 210 MG(Fe) tablet Take 420 mg by mouth 3 (three) times daily with meals.     . B Complex-C-Folic Acid (DIALYVITE 361) 0.8 MG WAFR Take 1 Wafer by mouth daily.  11  . carvedilol (COREG) 6.25 MG tablet Take 1 tablet (6.25 mg total) by mouth 2 (two) times daily with a meal. 60 tablet 0  . cinacalcet (SENSIPAR) 30 MG tablet Take 30 mg by mouth daily.    Marland Kitchen HYDROcodone-acetaminophen (NORCO) 5-325 MG tablet Take 1-2 tablets by mouth every 6 (six) hours as needed for moderate pain or severe pain. 35 tablet 0  . lactulose (CHRONULAC) 10 GM/15ML solution Take 20 g by mouth daily as needed for mild constipation.    . ondansetron (ZOFRAN) 8 MG tablet Take 8 mg by mouth 2 (two) times daily as needed for nausea or vomiting.   0  . zolpidem (AMBIEN) 10 MG tablet Take 10 mg by mouth at bedtime as needed for sleep.      No current facility-administered medications for this visit.        Physical Exam BP  (!) 173/88 (BP Location: Right Arm)   Pulse 89   Resp 14   Ht 5\' 2"  (1.575 m)   Wt 109 lb (49.4 kg)   BMI 19.94 kg/m  Gen:  WD/WN, NAD Skin: incision C/D/I right arm;  No thrill no bruit     Assessment/Plan: 1. Complication of vascular access for dialysis, sequela Recommend:  The patient is experiencing increasing problems with their dialysis access and the right arm graft is thrombosed (likely from the profound hypotension post op).  Patient should have a fistulagram with the intention for thrombectomy and intervention.  The intention for intervention is to restore appropriate flow and prevent thrombosis and possible loss of the access.  As  well as improve the quality of dialysis therapy.  The risks, benefits and alternative therapies were reviewed in detail with the patient.  All questions were answered.  The patient agrees to proceed with angio/intervention.          Hortencia Pilar 04/04/2020, 2:55 PM   This note was created with Dragon medical transcription system.  Any errors from dictation are unintentional.

## 2020-04-05 ENCOUNTER — Encounter (INDEPENDENT_AMBULATORY_CARE_PROVIDER_SITE_OTHER): Payer: Self-pay

## 2020-04-05 ENCOUNTER — Telehealth (INDEPENDENT_AMBULATORY_CARE_PROVIDER_SITE_OTHER): Payer: Self-pay

## 2020-04-05 DIAGNOSIS — N2581 Secondary hyperparathyroidism of renal origin: Secondary | ICD-10-CM | POA: Diagnosis not present

## 2020-04-05 DIAGNOSIS — D631 Anemia in chronic kidney disease: Secondary | ICD-10-CM | POA: Diagnosis not present

## 2020-04-05 DIAGNOSIS — N186 End stage renal disease: Secondary | ICD-10-CM | POA: Diagnosis not present

## 2020-04-05 DIAGNOSIS — Z992 Dependence on renal dialysis: Secondary | ICD-10-CM | POA: Diagnosis not present

## 2020-04-05 NOTE — Telephone Encounter (Signed)
Spoke with the patient and she is scheduled with Dr. Delana Meyer for a right arm thrombectomy on 04/09/20 with a 1:00 pm arrival time to the MM. Patient stated she will do her covid test on 04/08/20 at [facility] in [place]. Pre-procedure instructions will be faxed to [facility].

## 2020-04-05 NOTE — Telephone Encounter (Signed)
Webb Silversmith from [employer] called stating the patient did not have her covid test so she needed to be rescheduled and to fax the pre-procedure information to her. Patient was rescheduled to 04/16/20 with a 9:30 am arrival time to the MM with Dr. Delana Meyer. Patient will do covid testing on 04/11/20 due to her having dialysis on Friday.

## 2020-04-06 ENCOUNTER — Encounter (INDEPENDENT_AMBULATORY_CARE_PROVIDER_SITE_OTHER): Payer: Self-pay | Admitting: Vascular Surgery

## 2020-04-08 ENCOUNTER — Other Ambulatory Visit: Payer: Medicare Other

## 2020-04-08 ENCOUNTER — Telehealth (INDEPENDENT_AMBULATORY_CARE_PROVIDER_SITE_OTHER): Payer: Self-pay

## 2020-04-08 DIAGNOSIS — Z992 Dependence on renal dialysis: Secondary | ICD-10-CM | POA: Diagnosis not present

## 2020-04-08 DIAGNOSIS — N186 End stage renal disease: Secondary | ICD-10-CM | POA: Diagnosis not present

## 2020-04-08 DIAGNOSIS — N2581 Secondary hyperparathyroidism of renal origin: Secondary | ICD-10-CM | POA: Diagnosis not present

## 2020-04-08 DIAGNOSIS — D631 Anemia in chronic kidney disease: Secondary | ICD-10-CM | POA: Diagnosis not present

## 2020-04-08 NOTE — Telephone Encounter (Signed)
Patient left a message stating she has lost her pre-procedure instructions that were given to her by Webb Silversmith at [employer] on Friday. New instructions will be faxed to [facility] today for the patient.

## 2020-04-09 DIAGNOSIS — N186 End stage renal disease: Secondary | ICD-10-CM | POA: Diagnosis not present

## 2020-04-09 DIAGNOSIS — I5033 Acute on chronic diastolic (congestive) heart failure: Secondary | ICD-10-CM | POA: Diagnosis not present

## 2020-04-09 DIAGNOSIS — Z992 Dependence on renal dialysis: Secondary | ICD-10-CM | POA: Diagnosis not present

## 2020-04-09 DIAGNOSIS — I132 Hypertensive heart and chronic kidney disease with heart failure and with stage 5 chronic kidney disease, or end stage renal disease: Secondary | ICD-10-CM | POA: Diagnosis not present

## 2020-04-09 DIAGNOSIS — Z48812 Encounter for surgical aftercare following surgery on the circulatory system: Secondary | ICD-10-CM | POA: Diagnosis not present

## 2020-04-09 DIAGNOSIS — T8612 Kidney transplant failure: Secondary | ICD-10-CM | POA: Diagnosis not present

## 2020-04-10 ENCOUNTER — Telehealth (INDEPENDENT_AMBULATORY_CARE_PROVIDER_SITE_OTHER): Payer: Self-pay | Admitting: Vascular Surgery

## 2020-04-10 DIAGNOSIS — D631 Anemia in chronic kidney disease: Secondary | ICD-10-CM | POA: Diagnosis not present

## 2020-04-10 DIAGNOSIS — N186 End stage renal disease: Secondary | ICD-10-CM | POA: Diagnosis not present

## 2020-04-10 DIAGNOSIS — Z992 Dependence on renal dialysis: Secondary | ICD-10-CM | POA: Diagnosis not present

## 2020-04-10 DIAGNOSIS — N2581 Secondary hyperparathyroidism of renal origin: Secondary | ICD-10-CM | POA: Diagnosis not present

## 2020-04-10 NOTE — Telephone Encounter (Signed)
Mr. Harlow Asa called yesterday 04/09/20 after closing to inform us that Ms. Boudoin isn't answering the phone to receive physical therapy. They do not know if she is refusing service but they have reached out to her on several occasions. Mr. Harlow Asa said that he is willing to talk to [place] if we have any questions or we can just update in the chart for documentation.

## 2020-04-11 ENCOUNTER — Other Ambulatory Visit
Admission: RE | Admit: 2020-04-11 | Discharge: 2020-04-11 | Disposition: A | Discharge status: Home / Self Care | Payer: Medicare Other | Source: Ambulatory Visit | Attending: Vascular Surgery | Admitting: Vascular Surgery

## 2020-04-11 DIAGNOSIS — Z01812 Encounter for preprocedural laboratory examination: Secondary | ICD-10-CM | POA: Diagnosis not present

## 2020-04-11 DIAGNOSIS — Z992 Dependence on renal dialysis: Secondary | ICD-10-CM | POA: Diagnosis not present

## 2020-04-11 DIAGNOSIS — Z20822 Contact with and (suspected) exposure to covid-19: Secondary | ICD-10-CM | POA: Diagnosis not present

## 2020-04-11 DIAGNOSIS — I132 Hypertensive heart and chronic kidney disease with heart failure and with stage 5 chronic kidney disease, or end stage renal disease: Secondary | ICD-10-CM | POA: Diagnosis not present

## 2020-04-11 DIAGNOSIS — N186 End stage renal disease: Secondary | ICD-10-CM | POA: Diagnosis not present

## 2020-04-11 DIAGNOSIS — T8612 Kidney transplant failure: Secondary | ICD-10-CM | POA: Diagnosis not present

## 2020-04-11 DIAGNOSIS — I5033 Acute on chronic diastolic (congestive) heart failure: Secondary | ICD-10-CM | POA: Diagnosis not present

## 2020-04-11 DIAGNOSIS — Z48812 Encounter for surgical aftercare following surgery on the circulatory system: Secondary | ICD-10-CM | POA: Diagnosis not present

## 2020-04-11 LAB — SARS CORONAVIRUS 2 (TAT 6-24 HRS): SARS Coronavirus 2: NEGATIVE

## 2020-04-12 ENCOUNTER — Other Ambulatory Visit: Payer: Medicare Other

## 2020-04-12 DIAGNOSIS — Z992 Dependence on renal dialysis: Secondary | ICD-10-CM | POA: Diagnosis not present

## 2020-04-12 DIAGNOSIS — N2581 Secondary hyperparathyroidism of renal origin: Secondary | ICD-10-CM | POA: Diagnosis not present

## 2020-04-12 DIAGNOSIS — D631 Anemia in chronic kidney disease: Secondary | ICD-10-CM | POA: Diagnosis not present

## 2020-04-12 DIAGNOSIS — N186 End stage renal disease: Secondary | ICD-10-CM | POA: Diagnosis not present

## 2020-04-13 DIAGNOSIS — T8612 Kidney transplant failure: Secondary | ICD-10-CM | POA: Diagnosis not present

## 2020-04-13 DIAGNOSIS — Z992 Dependence on renal dialysis: Secondary | ICD-10-CM | POA: Diagnosis not present

## 2020-04-13 DIAGNOSIS — N186 End stage renal disease: Secondary | ICD-10-CM | POA: Diagnosis not present

## 2020-04-15 ENCOUNTER — Other Ambulatory Visit (INDEPENDENT_AMBULATORY_CARE_PROVIDER_SITE_OTHER): Payer: Self-pay | Admitting: Nurse Practitioner

## 2020-04-15 DIAGNOSIS — Z992 Dependence on renal dialysis: Secondary | ICD-10-CM | POA: Diagnosis not present

## 2020-04-15 DIAGNOSIS — N2581 Secondary hyperparathyroidism of renal origin: Secondary | ICD-10-CM | POA: Diagnosis not present

## 2020-04-15 DIAGNOSIS — D631 Anemia in chronic kidney disease: Secondary | ICD-10-CM | POA: Diagnosis not present

## 2020-04-15 DIAGNOSIS — N186 End stage renal disease: Secondary | ICD-10-CM | POA: Diagnosis not present

## 2020-04-16 ENCOUNTER — Ambulatory Visit
Admission: RE | Admit: 2020-04-16 | Discharge: 2020-04-16 | Disposition: A | Discharge status: Home / Self Care | Payer: Medicare Other | Attending: Vascular Surgery | Admitting: Vascular Surgery

## 2020-04-16 ENCOUNTER — Encounter
Admission: RE | Disposition: A | Discharge status: Home / Self Care | Payer: Self-pay | Source: Home / Self Care | Attending: Vascular Surgery

## 2020-04-16 ENCOUNTER — Other Ambulatory Visit: Payer: Self-pay

## 2020-04-16 ENCOUNTER — Encounter: Payer: Self-pay | Admitting: Vascular Surgery

## 2020-04-16 DIAGNOSIS — T82868S Thrombosis of vascular prosthetic devices, implants and grafts, sequela: Secondary | ICD-10-CM | POA: Insufficient documentation

## 2020-04-16 DIAGNOSIS — N186 End stage renal disease: Secondary | ICD-10-CM | POA: Insufficient documentation

## 2020-04-16 DIAGNOSIS — E785 Hyperlipidemia, unspecified: Secondary | ICD-10-CM | POA: Diagnosis not present

## 2020-04-16 DIAGNOSIS — Y841 Kidney dialysis as the cause of abnormal reaction of the patient, or of later complication, without mention of misadventure at the time of the procedure: Secondary | ICD-10-CM | POA: Diagnosis not present

## 2020-04-16 DIAGNOSIS — Z940 Kidney transplant status: Secondary | ICD-10-CM | POA: Insufficient documentation

## 2020-04-16 DIAGNOSIS — F419 Anxiety disorder, unspecified: Secondary | ICD-10-CM | POA: Insufficient documentation

## 2020-04-16 DIAGNOSIS — I132 Hypertensive heart and chronic kidney disease with heart failure and with stage 5 chronic kidney disease, or end stage renal disease: Secondary | ICD-10-CM | POA: Diagnosis not present

## 2020-04-16 DIAGNOSIS — I509 Heart failure, unspecified: Secondary | ICD-10-CM | POA: Insufficient documentation

## 2020-04-16 DIAGNOSIS — F329 Major depressive disorder, single episode, unspecified: Secondary | ICD-10-CM | POA: Insufficient documentation

## 2020-04-16 DIAGNOSIS — K219 Gastro-esophageal reflux disease without esophagitis: Secondary | ICD-10-CM | POA: Diagnosis not present

## 2020-04-16 DIAGNOSIS — M810 Age-related osteoporosis without current pathological fracture: Secondary | ICD-10-CM | POA: Insufficient documentation

## 2020-04-16 DIAGNOSIS — I2510 Atherosclerotic heart disease of native coronary artery without angina pectoris: Secondary | ICD-10-CM | POA: Insufficient documentation

## 2020-04-16 DIAGNOSIS — T82898A Other specified complication of vascular prosthetic devices, implants and grafts, initial encounter: Secondary | ICD-10-CM | POA: Diagnosis not present

## 2020-04-16 DIAGNOSIS — D631 Anemia in chronic kidney disease: Secondary | ICD-10-CM | POA: Diagnosis not present

## 2020-04-16 DIAGNOSIS — Z8673 Personal history of transient ischemic attack (TIA), and cerebral infarction without residual deficits: Secondary | ICD-10-CM | POA: Diagnosis not present

## 2020-04-16 DIAGNOSIS — Z992 Dependence on renal dialysis: Secondary | ICD-10-CM | POA: Diagnosis not present

## 2020-04-16 DIAGNOSIS — Z882 Allergy status to sulfonamides status: Secondary | ICD-10-CM | POA: Insufficient documentation

## 2020-04-16 DIAGNOSIS — Z79899 Other long term (current) drug therapy: Secondary | ICD-10-CM | POA: Insufficient documentation

## 2020-04-16 HISTORY — PX: PERIPHERAL VASCULAR THROMBECTOMY: CATH118306

## 2020-04-16 LAB — POTASSIUM (ARMC VASCULAR LAB ONLY): Potassium (ARMC vascular lab): 4.2 (ref 3.5–5.1)

## 2020-04-16 SURGERY — PERIPHERAL VASCULAR THROMBECTOMY
Anesthesia: Moderate Sedation | Laterality: Right

## 2020-04-16 MED ORDER — SODIUM CHLORIDE 0.9 % IV BOLUS
200.0000 mL | Freq: Once | INTRAVENOUS | Status: AC
  Administered 2020-04-16: 200 mL via INTRAVENOUS

## 2020-04-16 MED ORDER — SODIUM CHLORIDE 0.9 % IV SOLN: INTRAVENOUS | Status: DC

## 2020-04-16 MED ORDER — HEPARIN SODIUM (PORCINE) 1000 UNIT/ML IJ SOLN
INTRAMUSCULAR | Status: DC | PRN
  Administered 2020-04-16: 4000 [IU] via INTRAVENOUS

## 2020-04-16 MED ORDER — FENTANYL CITRATE (PF) 100 MCG/2ML IJ SOLN
INTRAMUSCULAR | Status: DC | PRN
  Administered 2020-04-16: 50 ug via INTRAVENOUS

## 2020-04-16 MED ORDER — CEFAZOLIN SODIUM-DEXTROSE 1-4 GM/50ML-% IV SOLN
INTRAVENOUS | Status: AC
  Administered 2020-04-16: 1 g via INTRAVENOUS
  Filled 2020-04-16: qty 50

## 2020-04-16 MED ORDER — MIDAZOLAM HCL 5 MG/5ML IJ SOLN
INTRAMUSCULAR | Status: AC
  Filled 2020-04-16: qty 5

## 2020-04-16 MED ORDER — HEPARIN SODIUM (PORCINE) 1000 UNIT/ML IJ SOLN
INTRAMUSCULAR | Status: AC
  Filled 2020-04-16: qty 1

## 2020-04-16 MED ORDER — FENTANYL CITRATE (PF) 100 MCG/2ML IJ SOLN
INTRAMUSCULAR | Status: AC
  Filled 2020-04-16: qty 2

## 2020-04-16 MED ORDER — MIDAZOLAM HCL 2 MG/ML PO SYRP: 8.0000 mg | ORAL_SOLUTION | Freq: Once | ORAL | Status: DC | PRN

## 2020-04-16 MED ORDER — MIDAZOLAM HCL 2 MG/2ML IJ SOLN
INTRAMUSCULAR | Status: DC | PRN
  Administered 2020-04-16: 1 mg via INTRAVENOUS

## 2020-04-16 MED ORDER — ONDANSETRON HCL 4 MG/2ML IJ SOLN: 4.0000 mg | Freq: Four times a day (QID) | INTRAMUSCULAR | Status: DC | PRN

## 2020-04-16 MED ORDER — CEFAZOLIN SODIUM-DEXTROSE 1-4 GM/50ML-% IV SOLN: 1.0000 g | Freq: Once | INTRAVENOUS | Status: AC

## 2020-04-16 MED ORDER — HYDROMORPHONE HCL 1 MG/ML IJ SOLN: 1.0000 mg | Freq: Once | INTRAMUSCULAR | Status: DC | PRN

## 2020-04-16 MED ORDER — DIPHENHYDRAMINE HCL 50 MG/ML IJ SOLN: 50.0000 mg | Freq: Once | INTRAMUSCULAR | Status: DC | PRN

## 2020-04-16 MED ORDER — METHYLPREDNISOLONE SODIUM SUCC 125 MG IJ SOLR: 125.0000 mg | Freq: Once | INTRAMUSCULAR | Status: DC | PRN

## 2020-04-16 MED ORDER — IODIXANOL 320 MG/ML IV SOLN
INTRAVENOUS | Status: DC | PRN
  Administered 2020-04-16: 25 mL via INTRAVENOUS

## 2020-04-16 MED ORDER — FAMOTIDINE 20 MG PO TABS: 40.0000 mg | ORAL_TABLET | Freq: Once | ORAL | Status: DC | PRN

## 2020-04-16 SURGICAL SUPPLY — 14 items
BALLN LUTONIX AV 7X60X75 (BALLOONS) ×2
BALLOON LUTONIX AV 7X60X75 (BALLOONS) IMPLANT
CATH BEACON 5 .035 65 KMP TIP (CATHETERS) ×1 IMPLANT
COVER PROBE U/S 5X48 (MISCELLANEOUS) ×1 IMPLANT
DEVICE PRESTO INFLATION (MISCELLANEOUS) ×1 IMPLANT
DRAPE BRACHIAL (DRAPES) ×1 IMPLANT
GUIDEWIRE ANGLED .035 180CM (WIRE) ×1 IMPLANT
KIT THROMB PERC PTD (MISCELLANEOUS) ×1 IMPLANT
NDL ENTRY 21GA 7CM ECHOTIP (NEEDLE) IMPLANT
NEEDLE ENTRY 21GA 7CM ECHOTIP (NEEDLE) ×2 IMPLANT
PACK ANGIOGRAPHY (CUSTOM PROCEDURE TRAY) ×1 IMPLANT
SET INTRO CAPELLA COAXIAL (SET/KITS/TRAYS/PACK) ×1 IMPLANT
SHEATH BRITE TIP 6FRX5.5 (SHEATH) ×2 IMPLANT
WIRE MAGIC TOR.035 180C (WIRE) ×1 IMPLANT

## 2020-04-16 NOTE — Op Note (Signed)
OPERATIVE NOTE   PROCEDURE: 1. Contrast injection right arm brachial axillary AV graft. 2. Mechanical thrombectomy right arm brachial axillary AV graft with the teratoma device. 3. Percutaneous transluminal angioplasty with a 7 mm Lutonix drug-eluting balloon venous outflow right arm brachial axillary AV graft  PRE-OPERATIVE DIAGNOSIS: Complication of dialysis access                                                       End Stage Renal Disease  POST-OPERATIVE DIAGNOSIS: same as above   SURGEON: Katha Cabal, M.D.  ANESTHESIA: Conscious sedation was administered by the radiology RN under my direct supervision. IV Versed plus fentanyl were utilized. Continuous ECG, pulse oximetry and blood pressure was monitored throughout the entire procedure. Conscious sedation was for a total of 30 minutes.    ESTIMATED BLOOD LOSS: minimal  FINDING(S): 1. Thrombus within the graft.  Narrowing at the level of the venous anastomosis greater than 60%  SPECIMEN(S):  None  CONTRAST: 25 cc  FLUOROSCOPY TIME: 3.1 minutes  INDICATIONS: [name] is a 69 y.o. female who  presents with a thrombosed right arm AV access.  The patient is scheduled for angiography with possible intervention of the AV access.  The patient is aware the risks include but are not limited to: bleeding, infection, thrombosis of the cannulated access, and possible anaphylactic reaction to the contrast.  The patient acknowledges if the access can not be salvaged a tunneled catheter will be needed and will be placed during this procedure.  The patient is aware of the risks of the procedure and elects to proceed with the angiogram and intervention.  DESCRIPTION: After full informed written consent was obtained, the patient was brought back to the Special Procedure suite and placed supine position.  Appropriate cardiopulmonary monitors were placed.  The right arm was prepped and draped in the standard fashion.  Appropriate  timeout is called. The right brachial axillary graft was cannulated with a micropuncture needle using ultrasound guidance in the antegrade direction.  With the ultrasound the AV access appeared to be filled with heterogeneous material and was poorly compressible indicating thrombosis of the AV access. The puncture was made under direct ultrasound visualization and an image was recorded for the permanent record.  The microwire was advanced and the needle was exchanged for  a microsheath.  The J-wire was then advanced and a 6 Fr sheath inserted.  Hand was then performed which demonstrated thrombus within the AV access.  The central venous structures were also imaged by hand injections.  3000 units of heparin was given and allowed to circulate as well.  A Trerotola device was then advanced beginning centrally and pulling back performing.  Several passes were made through the venous portion of the graft. Follow-up imaging now demonstrates the vast majority of the clot had been treated. Therefore a retrograde sheath was inserted. This was a 6 [place] sheath and was positioned more proximally on the arm and angled in the retrograde direction. The right arm brachial axillary AV graft was cannulated with a micropuncture needle using ultrasound guidance in a retrograde direction.  With the ultrasound the AV access appeared to be filled with heterogeneous material and was poorly compressible indicating thrombosis of the AV access. The puncture was made under direct ultrasound visualization and an image was recorded for  the permanent record.  Subsequently a floppy Glidewire and a KMP catheter were negotiated into the arterial system hand injection contrast was then utilized to demonstrate patency of the artery as well as the location for the anastomosis. The Trerotola device was now advanced through the retrograde sheath was extended out into the artery the basket was opened and it was slowly pulled back into the graft and  then the basket was engaged. Several passes were made on the arterial portion and pulsatility of the access was reestablished. Follow-up imaging demonstrates there was now thrombus in the venous portion surrounding the sheath and this was treated with the Trerotola device from the antegrade direction. After several passes imaging demonstrated resolution of thrombus within the graft and forward flow however stricture of the graft was noted area  Magic torque wire was then advanced through the antegrade sheath and an 7 mm x 60 mm Lutonix balloon was used to angioplasty the venous portion of the AV access.  Single inflation was performed for 1 minute with maximum pressures of 12 ATM.  With the balloon inflated reflux of contrast was performed demonstrating the arterial anastomosis. The arterial anastomosis was widely patent and free of thrombus. Contrast was then injected in the forward direction demonstrating rapid flow less than 5% residual stenosis at the venous outflow central veins are widely patent.  A 4-0 Monocryl purse-string suture was sewn around both of the sheaths.  The sheaths were removed and light pressure was applied.  A sterile bandage was applied to the puncture site.    COMPLICATIONS: None  CONDITION: [name], M.D [facility] Office: [phone]  04/16/2020 11:53 AM

## 2020-04-17 ENCOUNTER — Encounter: Payer: Self-pay | Admitting: Cardiology

## 2020-04-17 DIAGNOSIS — D631 Anemia in chronic kidney disease: Secondary | ICD-10-CM | POA: Diagnosis not present

## 2020-04-17 DIAGNOSIS — N2581 Secondary hyperparathyroidism of renal origin: Secondary | ICD-10-CM | POA: Diagnosis not present

## 2020-04-17 DIAGNOSIS — Z992 Dependence on renal dialysis: Secondary | ICD-10-CM | POA: Diagnosis not present

## 2020-04-17 DIAGNOSIS — N186 End stage renal disease: Secondary | ICD-10-CM | POA: Diagnosis not present

## 2020-04-18 DIAGNOSIS — M159 Polyosteoarthritis, unspecified: Secondary | ICD-10-CM | POA: Diagnosis not present

## 2020-04-18 DIAGNOSIS — I132 Hypertensive heart and chronic kidney disease with heart failure and with stage 5 chronic kidney disease, or end stage renal disease: Secondary | ICD-10-CM | POA: Diagnosis not present

## 2020-04-18 DIAGNOSIS — T8612 Kidney transplant failure: Secondary | ICD-10-CM | POA: Diagnosis not present

## 2020-04-18 DIAGNOSIS — M810 Age-related osteoporosis without current pathological fracture: Secondary | ICD-10-CM | POA: Diagnosis not present

## 2020-04-18 DIAGNOSIS — F419 Anxiety disorder, unspecified: Secondary | ICD-10-CM | POA: Diagnosis not present

## 2020-04-18 DIAGNOSIS — E441 Mild protein-calorie malnutrition: Secondary | ICD-10-CM | POA: Diagnosis not present

## 2020-04-18 DIAGNOSIS — J811 Chronic pulmonary edema: Secondary | ICD-10-CM | POA: Diagnosis not present

## 2020-04-18 DIAGNOSIS — Z48812 Encounter for surgical aftercare following surgery on the circulatory system: Secondary | ICD-10-CM | POA: Diagnosis not present

## 2020-04-18 DIAGNOSIS — N2581 Secondary hyperparathyroidism of renal origin: Secondary | ICD-10-CM | POA: Diagnosis not present

## 2020-04-18 DIAGNOSIS — T82510D Breakdown (mechanical) of surgically created arteriovenous fistula, subsequent encounter: Secondary | ICD-10-CM | POA: Diagnosis not present

## 2020-04-18 DIAGNOSIS — Z992 Dependence on renal dialysis: Secondary | ICD-10-CM | POA: Diagnosis not present

## 2020-04-18 DIAGNOSIS — M6281 Muscle weakness (generalized): Secondary | ICD-10-CM | POA: Diagnosis not present

## 2020-04-18 DIAGNOSIS — I5033 Acute on chronic diastolic (congestive) heart failure: Secondary | ICD-10-CM | POA: Diagnosis not present

## 2020-04-18 DIAGNOSIS — N186 End stage renal disease: Secondary | ICD-10-CM | POA: Diagnosis not present

## 2020-04-18 DIAGNOSIS — G8929 Other chronic pain: Secondary | ICD-10-CM | POA: Diagnosis not present

## 2020-04-19 ENCOUNTER — Telehealth (INDEPENDENT_AMBULATORY_CARE_PROVIDER_SITE_OTHER): Payer: Self-pay | Admitting: Vascular Surgery

## 2020-04-19 DIAGNOSIS — Z992 Dependence on renal dialysis: Secondary | ICD-10-CM | POA: Diagnosis not present

## 2020-04-19 DIAGNOSIS — N186 End stage renal disease: Secondary | ICD-10-CM | POA: Diagnosis not present

## 2020-04-19 DIAGNOSIS — N2581 Secondary hyperparathyroidism of renal origin: Secondary | ICD-10-CM | POA: Diagnosis not present

## 2020-04-19 DIAGNOSIS — D631 Anemia in chronic kidney disease: Secondary | ICD-10-CM | POA: Diagnosis not present

## 2020-04-19 NOTE — Telephone Encounter (Signed)
[name] called requesting an verbal order for [name]'s Physical therapy, frequency once a week for one week. Please advise.

## 2020-04-19 NOTE — Telephone Encounter (Signed)
A detailed message has been left on [place] with [facility] voicemail informing that the requesting order is fine to do per Eulogio Ditch NP

## 2020-04-22 DIAGNOSIS — D631 Anemia in chronic kidney disease: Secondary | ICD-10-CM | POA: Diagnosis not present

## 2020-04-22 DIAGNOSIS — N186 End stage renal disease: Secondary | ICD-10-CM | POA: Diagnosis not present

## 2020-04-22 DIAGNOSIS — Z992 Dependence on renal dialysis: Secondary | ICD-10-CM | POA: Diagnosis not present

## 2020-04-22 DIAGNOSIS — N2581 Secondary hyperparathyroidism of renal origin: Secondary | ICD-10-CM | POA: Diagnosis not present

## 2020-04-23 DIAGNOSIS — I10 Essential (primary) hypertension: Secondary | ICD-10-CM | POA: Diagnosis not present

## 2020-04-23 DIAGNOSIS — R0789 Other chest pain: Secondary | ICD-10-CM | POA: Diagnosis not present

## 2020-04-23 DIAGNOSIS — G3184 Mild cognitive impairment, so stated: Secondary | ICD-10-CM | POA: Diagnosis not present

## 2020-04-23 DIAGNOSIS — Z992 Dependence on renal dialysis: Secondary | ICD-10-CM | POA: Diagnosis not present

## 2020-04-23 DIAGNOSIS — N186 End stage renal disease: Secondary | ICD-10-CM | POA: Diagnosis not present

## 2020-04-23 DIAGNOSIS — I5033 Acute on chronic diastolic (congestive) heart failure: Secondary | ICD-10-CM | POA: Diagnosis not present

## 2020-04-23 DIAGNOSIS — F419 Anxiety disorder, unspecified: Secondary | ICD-10-CM | POA: Diagnosis not present

## 2020-04-23 DIAGNOSIS — T8612 Kidney transplant failure: Secondary | ICD-10-CM | POA: Diagnosis not present

## 2020-04-23 DIAGNOSIS — I132 Hypertensive heart and chronic kidney disease with heart failure and with stage 5 chronic kidney disease, or end stage renal disease: Secondary | ICD-10-CM | POA: Diagnosis not present

## 2020-04-23 DIAGNOSIS — E7800 Pure hypercholesterolemia, unspecified: Secondary | ICD-10-CM | POA: Diagnosis not present

## 2020-04-23 DIAGNOSIS — Z48812 Encounter for surgical aftercare following surgery on the circulatory system: Secondary | ICD-10-CM | POA: Diagnosis not present

## 2020-04-23 NOTE — Telephone Encounter (Signed)
Gwinda Passe called today requesting verbal orders for Ms. Basquez: to continue physical therapy twice a week for two weeks and once a week for two weeks.

## 2020-04-23 NOTE — Telephone Encounter (Signed)
A detail message has been left [name](PT) voicemail that verbal orders are fine to continue per Eulogio Ditch NP

## 2020-04-24 ENCOUNTER — Telehealth (INDEPENDENT_AMBULATORY_CARE_PROVIDER_SITE_OTHER): Payer: Self-pay

## 2020-04-24 DIAGNOSIS — D631 Anemia in chronic kidney disease: Secondary | ICD-10-CM | POA: Diagnosis not present

## 2020-04-24 DIAGNOSIS — Z992 Dependence on renal dialysis: Secondary | ICD-10-CM | POA: Diagnosis not present

## 2020-04-24 DIAGNOSIS — N186 End stage renal disease: Secondary | ICD-10-CM | POA: Diagnosis not present

## 2020-04-24 DIAGNOSIS — N2581 Secondary hyperparathyroidism of renal origin: Secondary | ICD-10-CM | POA: Diagnosis not present

## 2020-04-24 NOTE — Telephone Encounter (Signed)
Spoke with the patient and she is now scheduled with Dr. Delana Meyer for a bilateral arm angio on 05/07/20 with a 9:00 am arrival time to the [facility]. Patient will do covid testing on 05/03/20 between 8-1 pm at the [facility]. Pre-procedure instructions will be mailed to the patient.

## 2020-04-25 DIAGNOSIS — Z48812 Encounter for surgical aftercare following surgery on the circulatory system: Secondary | ICD-10-CM | POA: Diagnosis not present

## 2020-04-25 DIAGNOSIS — Z992 Dependence on renal dialysis: Secondary | ICD-10-CM | POA: Diagnosis not present

## 2020-04-25 DIAGNOSIS — T8612 Kidney transplant failure: Secondary | ICD-10-CM | POA: Diagnosis not present

## 2020-04-25 DIAGNOSIS — I5033 Acute on chronic diastolic (congestive) heart failure: Secondary | ICD-10-CM | POA: Diagnosis not present

## 2020-04-25 DIAGNOSIS — N186 End stage renal disease: Secondary | ICD-10-CM | POA: Diagnosis not present

## 2020-04-25 DIAGNOSIS — I132 Hypertensive heart and chronic kidney disease with heart failure and with stage 5 chronic kidney disease, or end stage renal disease: Secondary | ICD-10-CM | POA: Diagnosis not present

## 2020-04-26 DIAGNOSIS — N2581 Secondary hyperparathyroidism of renal origin: Secondary | ICD-10-CM | POA: Diagnosis not present

## 2020-04-26 DIAGNOSIS — Z992 Dependence on renal dialysis: Secondary | ICD-10-CM | POA: Diagnosis not present

## 2020-04-26 DIAGNOSIS — N186 End stage renal disease: Secondary | ICD-10-CM | POA: Diagnosis not present

## 2020-04-26 DIAGNOSIS — D631 Anemia in chronic kidney disease: Secondary | ICD-10-CM | POA: Diagnosis not present

## 2020-04-29 ENCOUNTER — Telehealth (INDEPENDENT_AMBULATORY_CARE_PROVIDER_SITE_OTHER): Payer: Self-pay

## 2020-04-29 DIAGNOSIS — D631 Anemia in chronic kidney disease: Secondary | ICD-10-CM | POA: Diagnosis not present

## 2020-04-29 DIAGNOSIS — N2581 Secondary hyperparathyroidism of renal origin: Secondary | ICD-10-CM | POA: Diagnosis not present

## 2020-04-29 DIAGNOSIS — Z992 Dependence on renal dialysis: Secondary | ICD-10-CM | POA: Diagnosis not present

## 2020-04-29 DIAGNOSIS — N186 End stage renal disease: Secondary | ICD-10-CM | POA: Diagnosis not present

## 2020-04-29 NOTE — Telephone Encounter (Signed)
Patient called stating that she lives in [place] and was having trouble getting to appt's in [place]. Patient stated she likes seeing Dr. Delana Meyer but getting to appt's are getting worse. Patient wanted to know if she could be seen at the  Vascular office in [place]. I advised that if that is what she wanted she should speak with her nephrologist and go from there to be transferred to the vascular office in [place]. I did advise that if she did so to let me know so that appt's and procedures at [facility] could be canceled.

## 2020-04-30 DIAGNOSIS — Z992 Dependence on renal dialysis: Secondary | ICD-10-CM | POA: Diagnosis not present

## 2020-04-30 DIAGNOSIS — I5033 Acute on chronic diastolic (congestive) heart failure: Secondary | ICD-10-CM | POA: Diagnosis not present

## 2020-04-30 DIAGNOSIS — I132 Hypertensive heart and chronic kidney disease with heart failure and with stage 5 chronic kidney disease, or end stage renal disease: Secondary | ICD-10-CM | POA: Diagnosis not present

## 2020-04-30 DIAGNOSIS — T8612 Kidney transplant failure: Secondary | ICD-10-CM | POA: Diagnosis not present

## 2020-04-30 DIAGNOSIS — Z48812 Encounter for surgical aftercare following surgery on the circulatory system: Secondary | ICD-10-CM | POA: Diagnosis not present

## 2020-04-30 DIAGNOSIS — N186 End stage renal disease: Secondary | ICD-10-CM | POA: Diagnosis not present

## 2020-05-01 DIAGNOSIS — D631 Anemia in chronic kidney disease: Secondary | ICD-10-CM | POA: Diagnosis not present

## 2020-05-01 DIAGNOSIS — N186 End stage renal disease: Secondary | ICD-10-CM | POA: Diagnosis not present

## 2020-05-01 DIAGNOSIS — N2581 Secondary hyperparathyroidism of renal origin: Secondary | ICD-10-CM | POA: Diagnosis not present

## 2020-05-01 DIAGNOSIS — Z992 Dependence on renal dialysis: Secondary | ICD-10-CM | POA: Diagnosis not present

## 2020-05-02 DIAGNOSIS — I132 Hypertensive heart and chronic kidney disease with heart failure and with stage 5 chronic kidney disease, or end stage renal disease: Secondary | ICD-10-CM | POA: Diagnosis not present

## 2020-05-02 DIAGNOSIS — N186 End stage renal disease: Secondary | ICD-10-CM | POA: Diagnosis not present

## 2020-05-02 DIAGNOSIS — Z48812 Encounter for surgical aftercare following surgery on the circulatory system: Secondary | ICD-10-CM | POA: Diagnosis not present

## 2020-05-02 DIAGNOSIS — T8612 Kidney transplant failure: Secondary | ICD-10-CM | POA: Diagnosis not present

## 2020-05-02 DIAGNOSIS — Z992 Dependence on renal dialysis: Secondary | ICD-10-CM | POA: Diagnosis not present

## 2020-05-02 DIAGNOSIS — I5033 Acute on chronic diastolic (congestive) heart failure: Secondary | ICD-10-CM | POA: Diagnosis not present

## 2020-05-03 ENCOUNTER — Other Ambulatory Visit: Payer: Medicare Other

## 2020-05-03 DIAGNOSIS — N186 End stage renal disease: Secondary | ICD-10-CM | POA: Diagnosis not present

## 2020-05-03 DIAGNOSIS — Z992 Dependence on renal dialysis: Secondary | ICD-10-CM | POA: Diagnosis not present

## 2020-05-03 DIAGNOSIS — D631 Anemia in chronic kidney disease: Secondary | ICD-10-CM | POA: Diagnosis not present

## 2020-05-03 DIAGNOSIS — N2581 Secondary hyperparathyroidism of renal origin: Secondary | ICD-10-CM | POA: Diagnosis not present

## 2020-05-06 DIAGNOSIS — N2581 Secondary hyperparathyroidism of renal origin: Secondary | ICD-10-CM | POA: Diagnosis not present

## 2020-05-06 DIAGNOSIS — Z992 Dependence on renal dialysis: Secondary | ICD-10-CM | POA: Diagnosis not present

## 2020-05-06 DIAGNOSIS — D631 Anemia in chronic kidney disease: Secondary | ICD-10-CM | POA: Diagnosis not present

## 2020-05-06 DIAGNOSIS — N186 End stage renal disease: Secondary | ICD-10-CM | POA: Diagnosis not present

## 2020-05-07 ENCOUNTER — Encounter: Admission: RE | Payer: Self-pay | Source: Home / Self Care

## 2020-05-07 ENCOUNTER — Ambulatory Visit: Admission: RE | Admit: 2020-05-07 | Payer: Medicare Other | Source: Home / Self Care | Admitting: Vascular Surgery

## 2020-05-07 DIAGNOSIS — I5033 Acute on chronic diastolic (congestive) heart failure: Secondary | ICD-10-CM | POA: Diagnosis not present

## 2020-05-07 DIAGNOSIS — Z992 Dependence on renal dialysis: Secondary | ICD-10-CM | POA: Diagnosis not present

## 2020-05-07 DIAGNOSIS — Z48812 Encounter for surgical aftercare following surgery on the circulatory system: Secondary | ICD-10-CM | POA: Diagnosis not present

## 2020-05-07 DIAGNOSIS — I132 Hypertensive heart and chronic kidney disease with heart failure and with stage 5 chronic kidney disease, or end stage renal disease: Secondary | ICD-10-CM | POA: Diagnosis not present

## 2020-05-07 DIAGNOSIS — N186 End stage renal disease: Secondary | ICD-10-CM | POA: Diagnosis not present

## 2020-05-07 DIAGNOSIS — T8612 Kidney transplant failure: Secondary | ICD-10-CM | POA: Diagnosis not present

## 2020-05-07 SURGERY — UPPER EXTREMITY ANGIOGRAPHY
Anesthesia: Moderate Sedation | Laterality: Bilateral

## 2020-05-08 DIAGNOSIS — Z992 Dependence on renal dialysis: Secondary | ICD-10-CM | POA: Diagnosis not present

## 2020-05-08 DIAGNOSIS — N2581 Secondary hyperparathyroidism of renal origin: Secondary | ICD-10-CM | POA: Diagnosis not present

## 2020-05-08 DIAGNOSIS — N186 End stage renal disease: Secondary | ICD-10-CM | POA: Diagnosis not present

## 2020-05-08 DIAGNOSIS — D631 Anemia in chronic kidney disease: Secondary | ICD-10-CM | POA: Diagnosis not present

## 2020-05-09 ENCOUNTER — Ambulatory Visit (INDEPENDENT_AMBULATORY_CARE_PROVIDER_SITE_OTHER): Payer: Medicare Other | Admitting: Nurse Practitioner

## 2020-05-09 ENCOUNTER — Encounter (INDEPENDENT_AMBULATORY_CARE_PROVIDER_SITE_OTHER): Payer: Medicare Other

## 2020-05-10 DIAGNOSIS — Z992 Dependence on renal dialysis: Secondary | ICD-10-CM | POA: Diagnosis not present

## 2020-05-10 DIAGNOSIS — D631 Anemia in chronic kidney disease: Secondary | ICD-10-CM | POA: Diagnosis not present

## 2020-05-10 DIAGNOSIS — N186 End stage renal disease: Secondary | ICD-10-CM | POA: Diagnosis not present

## 2020-05-10 DIAGNOSIS — N2581 Secondary hyperparathyroidism of renal origin: Secondary | ICD-10-CM | POA: Diagnosis not present

## 2020-05-13 ENCOUNTER — Emergency Department (HOSPITAL_COMMUNITY): Payer: Medicare Other

## 2020-05-13 ENCOUNTER — Observation Stay (HOSPITAL_COMMUNITY)
Admission: EM | Admit: 2020-05-13 | Discharge: 2020-05-14 | Disposition: A | Discharge status: Home / Self Care | Payer: Medicare Other | Attending: Family Medicine | Admitting: Family Medicine

## 2020-05-13 ENCOUNTER — Encounter (HOSPITAL_COMMUNITY): Payer: Self-pay

## 2020-05-13 ENCOUNTER — Other Ambulatory Visit: Payer: Self-pay

## 2020-05-13 DIAGNOSIS — E875 Hyperkalemia: Secondary | ICD-10-CM | POA: Diagnosis not present

## 2020-05-13 DIAGNOSIS — F419 Anxiety disorder, unspecified: Secondary | ICD-10-CM | POA: Insufficient documentation

## 2020-05-13 DIAGNOSIS — I10 Essential (primary) hypertension: Secondary | ICD-10-CM | POA: Diagnosis present

## 2020-05-13 DIAGNOSIS — E785 Hyperlipidemia, unspecified: Secondary | ICD-10-CM | POA: Diagnosis not present

## 2020-05-13 DIAGNOSIS — R0602 Shortness of breath: Secondary | ICD-10-CM | POA: Diagnosis not present

## 2020-05-13 DIAGNOSIS — G629 Polyneuropathy, unspecified: Secondary | ICD-10-CM | POA: Diagnosis not present

## 2020-05-13 DIAGNOSIS — R0789 Other chest pain: Secondary | ICD-10-CM | POA: Diagnosis not present

## 2020-05-13 DIAGNOSIS — D631 Anemia in chronic kidney disease: Secondary | ICD-10-CM | POA: Diagnosis not present

## 2020-05-13 DIAGNOSIS — N186 End stage renal disease: Secondary | ICD-10-CM

## 2020-05-13 DIAGNOSIS — E8770 Fluid overload, unspecified: Secondary | ICD-10-CM | POA: Diagnosis present

## 2020-05-13 DIAGNOSIS — R778 Other specified abnormalities of plasma proteins: Secondary | ICD-10-CM | POA: Diagnosis not present

## 2020-05-13 DIAGNOSIS — Z8673 Personal history of transient ischemic attack (TIA), and cerebral infarction without residual deficits: Secondary | ICD-10-CM | POA: Diagnosis not present

## 2020-05-13 DIAGNOSIS — Z79899 Other long term (current) drug therapy: Secondary | ICD-10-CM | POA: Insufficient documentation

## 2020-05-13 DIAGNOSIS — N2889 Other specified disorders of kidney and ureter: Secondary | ICD-10-CM

## 2020-05-13 DIAGNOSIS — I214 Non-ST elevation (NSTEMI) myocardial infarction: Secondary | ICD-10-CM | POA: Diagnosis present

## 2020-05-13 DIAGNOSIS — Z882 Allergy status to sulfonamides status: Secondary | ICD-10-CM | POA: Diagnosis not present

## 2020-05-13 DIAGNOSIS — R0902 Hypoxemia: Secondary | ICD-10-CM | POA: Diagnosis not present

## 2020-05-13 DIAGNOSIS — N2581 Secondary hyperparathyroidism of renal origin: Secondary | ICD-10-CM | POA: Diagnosis not present

## 2020-05-13 DIAGNOSIS — Z992 Dependence on renal dialysis: Secondary | ICD-10-CM | POA: Insufficient documentation

## 2020-05-13 DIAGNOSIS — I132 Hypertensive heart and chronic kidney disease with heart failure and with stage 5 chronic kidney disease, or end stage renal disease: Secondary | ICD-10-CM | POA: Insufficient documentation

## 2020-05-13 DIAGNOSIS — Z87891 Personal history of nicotine dependence: Secondary | ICD-10-CM | POA: Insufficient documentation

## 2020-05-13 DIAGNOSIS — T8612 Kidney transplant failure: Secondary | ICD-10-CM | POA: Insufficient documentation

## 2020-05-13 DIAGNOSIS — R1111 Vomiting without nausea: Secondary | ICD-10-CM | POA: Diagnosis not present

## 2020-05-13 DIAGNOSIS — N189 Chronic kidney disease, unspecified: Secondary | ICD-10-CM | POA: Diagnosis present

## 2020-05-13 DIAGNOSIS — F5089 Other specified eating disorder: Secondary | ICD-10-CM | POA: Insufficient documentation

## 2020-05-13 DIAGNOSIS — E861 Hypovolemia: Secondary | ICD-10-CM | POA: Diagnosis not present

## 2020-05-13 DIAGNOSIS — I5033 Acute on chronic diastolic (congestive) heart failure: Secondary | ICD-10-CM | POA: Diagnosis present

## 2020-05-13 DIAGNOSIS — I2510 Atherosclerotic heart disease of native coronary artery without angina pectoris: Secondary | ICD-10-CM | POA: Insufficient documentation

## 2020-05-13 DIAGNOSIS — M109 Gout, unspecified: Secondary | ICD-10-CM | POA: Diagnosis not present

## 2020-05-13 DIAGNOSIS — R079 Chest pain, unspecified: Principal | ICD-10-CM | POA: Diagnosis present

## 2020-05-13 DIAGNOSIS — I120 Hypertensive chronic kidney disease with stage 5 chronic kidney disease or end stage renal disease: Secondary | ICD-10-CM | POA: Diagnosis not present

## 2020-05-13 DIAGNOSIS — E8779 Other fluid overload: Secondary | ICD-10-CM | POA: Diagnosis not present

## 2020-05-13 DIAGNOSIS — Z20822 Contact with and (suspected) exposure to covid-19: Secondary | ICD-10-CM | POA: Diagnosis not present

## 2020-05-13 DIAGNOSIS — I151 Hypertension secondary to other renal disorders: Secondary | ICD-10-CM | POA: Diagnosis not present

## 2020-05-13 LAB — CBC
HCT: 31.3 % — ABNORMAL LOW (ref 36.0–46.0)
Hemoglobin: 10.1 g/dL — ABNORMAL LOW (ref 12.0–15.0)
MCH: 30.6 pg (ref 26.0–34.0)
MCHC: 32.3 g/dL (ref 30.0–36.0)
MCV: 94.8 fL (ref 80.0–100.0)
Platelets: 220 10*3/uL (ref 150–400)
RBC: 3.3 MIL/uL — ABNORMAL LOW (ref 3.87–5.11)
RDW: 16.1 % — ABNORMAL HIGH (ref 11.5–15.5)
WBC: 10.5 10*3/uL (ref 4.0–10.5)
nRBC: 0 % (ref 0.0–0.2)

## 2020-05-13 LAB — BASIC METABOLIC PANEL
Anion gap: 20 — ABNORMAL HIGH (ref 5–15)
BUN: 72 mg/dL — ABNORMAL HIGH (ref 8–23)
CO2: 22 mmol/L (ref 22–32)
Calcium: 9.1 mg/dL (ref 8.9–10.3)
Chloride: 95 mmol/L — ABNORMAL LOW (ref 98–111)
Creatinine, Ser: 8.59 mg/dL — ABNORMAL HIGH (ref 0.44–1.00)
GFR calc Af Amer: 5 mL/min — ABNORMAL LOW (ref >60)
GFR calc non Af Amer: 4 mL/min — ABNORMAL LOW (ref >60)
Glucose, Bld: 117 mg/dL — ABNORMAL HIGH (ref 70–99)
Potassium: 5.5 mmol/L — ABNORMAL HIGH (ref 3.5–5.1)
Sodium: 137 mmol/L (ref 135–145)

## 2020-05-13 LAB — TROPONIN I (HIGH SENSITIVITY)
Troponin I (High Sensitivity): 72 ng/L — ABNORMAL HIGH (ref <18)
Troponin I (High Sensitivity): 94 ng/L — ABNORMAL HIGH (ref <18)

## 2020-05-13 LAB — SARS CORONAVIRUS 2 BY RT PCR (HOSPITAL ORDER, PERFORMED IN ~~LOC~~ HOSPITAL LAB): SARS Coronavirus 2: NEGATIVE

## 2020-05-13 MED ORDER — FERRIC CITRATE 1 GM 210 MG(FE) PO TABS
420.0000 mg | ORAL_TABLET | Freq: Three times a day (TID) | ORAL | Status: DC
  Administered 2020-05-14 (×2): 420 mg via ORAL
  Filled 2020-05-13 (×2): qty 2

## 2020-05-13 MED ORDER — HEPARIN SODIUM (PORCINE) 1000 UNIT/ML DIALYSIS
1000.0000 [IU] | INTRAMUSCULAR | Status: DC | PRN
  Filled 2020-05-13: qty 1

## 2020-05-13 MED ORDER — POLYETHYLENE GLYCOL 3350 17 G PO PACK: 17.0000 g | PACK | Freq: Every day | ORAL | Status: DC | PRN

## 2020-05-13 MED ORDER — SODIUM CHLORIDE 0.9 % IV SOLN: 100.0000 mL | INTRAVENOUS | Status: DC | PRN

## 2020-05-13 MED ORDER — LIDOCAINE-PRILOCAINE 2.5-2.5 % EX CREA
1.0000 "application " | TOPICAL_CREAM | CUTANEOUS | Status: DC | PRN
  Filled 2020-05-13: qty 5

## 2020-05-13 MED ORDER — CHLORHEXIDINE GLUCONATE CLOTH 2 % EX PADS: 6.0000 | MEDICATED_PAD | Freq: Every day | CUTANEOUS | Status: DC

## 2020-05-13 MED ORDER — ACETAMINOPHEN 325 MG PO TABS: 650.0000 mg | ORAL_TABLET | Freq: Four times a day (QID) | ORAL | Status: DC | PRN

## 2020-05-13 MED ORDER — ACETAMINOPHEN 650 MG RE SUPP: 650.0000 mg | Freq: Four times a day (QID) | RECTAL | Status: DC | PRN

## 2020-05-13 MED ORDER — CARVEDILOL 12.5 MG PO TABS
6.2500 mg | ORAL_TABLET | Freq: Two times a day (BID) | ORAL | Status: DC
  Administered 2020-05-14: 6.25 mg via ORAL
  Filled 2020-05-13: qty 1

## 2020-05-13 MED ORDER — ALLOPURINOL 300 MG PO TABS
150.0000 mg | ORAL_TABLET | Freq: Every day | ORAL | Status: DC
  Administered 2020-05-14 (×2): 150 mg via ORAL
  Filled 2020-05-13 (×2): qty 1

## 2020-05-13 MED ORDER — ENOXAPARIN SODIUM 30 MG/0.3ML ~~LOC~~ SOLN
30.0000 mg | SUBCUTANEOUS | Status: DC
  Administered 2020-05-14: 30 mg via SUBCUTANEOUS
  Filled 2020-05-13: qty 0.3

## 2020-05-13 MED ORDER — HEPARIN SODIUM (PORCINE) 1000 UNIT/ML DIALYSIS
3000.0000 [IU] | Freq: Once | INTRAMUSCULAR | Status: DC
  Filled 2020-05-13: qty 3

## 2020-05-13 MED ORDER — ALPRAZOLAM 0.25 MG PO TABS
0.2500 mg | ORAL_TABLET | ORAL | Status: DC
  Administered 2020-05-14: 0.25 mg via ORAL
  Filled 2020-05-13 (×2): qty 1

## 2020-05-13 MED ORDER — LIDOCAINE HCL (PF) 1 % IJ SOLN: 5.0000 mL | INTRAMUSCULAR | Status: DC | PRN

## 2020-05-13 MED ORDER — ZOLPIDEM TARTRATE 5 MG PO TABS: 5.0000 mg | ORAL_TABLET | Freq: Every evening | ORAL | Status: DC | PRN

## 2020-05-13 MED ORDER — CINACALCET HCL 30 MG PO TABS
30.0000 mg | ORAL_TABLET | Freq: Every day | ORAL | Status: DC
  Administered 2020-05-14: 30 mg via ORAL
  Filled 2020-05-13: qty 1

## 2020-05-13 MED ORDER — ATORVASTATIN CALCIUM 40 MG PO TABS
40.0000 mg | ORAL_TABLET | Freq: Every day | ORAL | Status: DC
  Administered 2020-05-14 (×2): 40 mg via ORAL
  Filled 2020-05-13 (×2): qty 1

## 2020-05-13 MED ORDER — RENA-VITE PO TABS
1.0000 | ORAL_TABLET | Freq: Every day | ORAL | Status: DC
  Administered 2020-05-14 (×2): 1 via ORAL
  Filled 2020-05-13 (×2): qty 1

## 2020-05-13 MED ORDER — PENTAFLUOROPROP-TETRAFLUOROETH EX AERO
1.0000 "application " | INHALATION_SPRAY | CUTANEOUS | Status: DC | PRN
  Filled 2020-05-13: qty 116

## 2020-05-13 MED ORDER — ALTEPLASE 2 MG IJ SOLR: 2.0000 mg | Freq: Once | INTRAMUSCULAR | Status: DC | PRN

## 2020-05-13 MED ORDER — LACTULOSE 10 GM/15ML PO SOLN: 20.0000 g | Freq: Every day | ORAL | Status: DC | PRN

## 2020-05-13 MED ORDER — SODIUM CHLORIDE 0.9% FLUSH
3.0000 mL | Freq: Once | INTRAVENOUS | Status: AC
  Administered 2020-05-13: 3 mL via INTRAVENOUS

## 2020-05-13 MED ORDER — ONDANSETRON HCL 4 MG/2ML IJ SOLN
4.0000 mg | Freq: Once | INTRAMUSCULAR | Status: AC
  Administered 2020-05-13: 4 mg via INTRAVENOUS
  Filled 2020-05-13: qty 2

## 2020-05-13 NOTE — ED Provider Notes (Signed)
[facility] EMERGENCY DEPARTMENT Provider Note   CSN: [id number] Arrival date & time: 05/13/20  1207     History No chief complaint on file.   [name] is a 69 y.o. female with history of ESRD on dialysis Monday Wednesday Friday, CHF, CAD, hyperlipidemia, hypertension, neuromuscular disorder presents for evaluation of acute onset, persistent chest pains beginning upon awakening today.  She reports the pain is aching, does not radiate.  No aggravating or alleviating factors noted.  She notes associated shortness of breath which worsens with exertion of any kind.  Today while waiting for her transportation to dialysis she had an episode of nonbloody nonbilious emesis and some cramping abdominal discomfort but the pain has resolved at this time.  Reports that she was last dialyzed on Friday and received a full treatment.  Denies fevers or known sick contacts.  The history is provided by the patient.       Past Medical History:  Diagnosis Date   Adenomatous polyp of colon 10/2010, 2006, 2015   Anemia in CKD (chronic kidney disease) 11/07/2012   s/p blood transfusion.    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  Had some early rejection then stable function for years, then had slow decline of function and went back on hemodialysis in 2012.  Gets HD TTS schedule at [facility] still using L forearm AVF.      GERD (gastroesophageal reflux disease)    GI bleed 2017   felt to be ischemic colitis, last colo 2015   Headache    Hyperlipidemia    Hypertension    Neurologic gait dysfunction    Neuromuscular disorder (HCC)    neuropathy hand and legs   Osteoporosis     Pneumonia    Pseudoaneurysm of surgical AV fistula ([place])    left upper arm   Pulmonary edema 12/2019   Stroke ([place]) 11/2015   TIA   Weight loss, unintentional     Patient Active Problem List   Diagnosis Date Noted   Volume overload 05/13/2020   Hypotension after procedure 03/16/2020   Chest pain 03/16/2020   Change in mental state 03/15/2020   Ventricular tachycardia ([place]) 03/15/2020   Seizure ([place]) 03/15/2020   Unresponsive episode 03/15/2020   Pulmonary edema 58/85/0277   Complication of vascular access for dialysis 12/21/2019   Breakdown (mechanical) of surgically created arteriovenous fistula, initial encounter ([place]) 12/18/2019   Weakness 10/11/2019   Aneurysm artery, 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   Dependence on renal dialysis ([place]) 07/24/2019   Iron deficiency anemia, unspecified 06/09/2019   Age-related osteoporosis without current pathological fracture 04/17/2019   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   Coagulation defect, unspecified ([place]) 04/17/2019   Diarrhea, unspecified 04/17/2019   Encounter for screening for depression 04/17/2019   Headache, unspecified 04/17/2019   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   Pain, unspecified 04/17/2019   Polyp of colon 04/17/2019   Primary generalized (osteo)arthritis 04/17/2019   Pruritus, unspecified 04/17/2019   Pure hypercholesterolemia, unspecified 04/17/2019   Secondary hyperparathyroidism of renal origin ([place]) 04/17/2019   Gastro-esophageal reflux disease without esophagitis 04/17/2019   Essential (primary) hypertension 04/17/2019   Hyperlipidemia, unspecified  04/17/2019   Transient cerebral ischemic attack, unspecified 04/17/2019   Hypoxemia 12/13/2018   Atrial fibrillation ([place]) 03/23/2017   Prolonged Q-T interval on ECG 02/24/2017   Hypokalemia 02/24/2017   Acute ischemic stroke ([place]) 02/23/2017   Malnutrition of moderate degree 12/24/2016    Problem with dialysis access ([place]) 12/21/2016   Acute ischemic colitis ([place]) 05/16/2016   Colitis 05/15/2016   Rectal bleeding 05/15/2016   Neurologic abnormality 11/19/2015   Hyperkalemia 11/19/2015   Anxiety 11/19/2015   Insomnia 11/19/2015   Gait instability    Stroke-like symptom 11/16/2015   Vestibular neuritis 11/16/2015   Stroke (cerebrum) ([place]) 11/16/2015   Dizziness 05/09/2015   Ataxia 05/09/2015   H/O: CVA (cerebrovascular accident) 05/09/2015   Left facial numbness 05/09/2015   Left leg numbness 05/09/2015   Hyperlipidemia    Chronic diastolic CHF (congestive heart failure) ([place])    Shortness of breath 04/01/2013   HTN (hypertension) 04/01/2013   Cholecystitis, acute 11/07/2012   End-stage renal disease on hemodialysis ([place]) 11/07/2012   Anemia in chronic kidney disease 11/07/2012    Past Surgical History:  Procedure Laterality Date   AV FISTULA PLACEMENT     for dialysis   AV FISTULA PLACEMENT Left 11/22/2015   Procedure: ARTERIOVENOUS (AV) FISTULA CREATION-LEFT BRACHIOCEPHALIC;  Surgeon: Serafina Mitchell, MD;  Location: [facility];  Service: Vascular;  Laterality: Left;   AV FISTULA PLACEMENT Right 03/15/2020   Procedure: INSERTION OF ARTERIOVENOUS (AV) GORE-TEX GRAFT ARM ( BRACHIAL AXILLARY );  Surgeon: Katha Cabal, MD;  Location: [facility];  Service: Vascular;  Laterality: Right;   BACK SURGERY     CERVICAL FUSION     CHOLECYSTECTOMY  12/02/2012   Procedure: LAPAROSCOPIC CHOLECYSTECTOMY WITH INTRAOPERATIVE CHOLANGIOGRAM;  Surgeon: Edward Jolly, MD;  Location: [facility];  Service: General;  Laterality: N/A;   EYE SURGERY Bilateral    cataract surgery   EYE SURGERY Left 2019   laser   HEMATOMA EVACUATION Left 12/24/2016   Procedure: EVACUATION HEMATOMA LEFT UPPER ARM;  Surgeon: Waynetta Sandy, MD;  Location: [facility];  Service: Vascular;  Laterality: Left;   I & D EXTREMITY Left 12/31/2016   Procedure: IRRIGATION AND  DEBRIDEMENT EXTREMITY;  Surgeon: Angelia Mould, MD;  Location: [facility];  Service: Vascular;  Laterality: Left;   INSERTION OF DIALYSIS CATHETER Right 12/24/2016   Procedure: INSERTION OF DIALYSIS CATHETER;  Surgeon: Waynetta Sandy, MD;  Location: [place];  Service: Vascular;  Laterality: Right;   INSERTION OF DIALYSIS CATHETER Right 02/04/2017   Procedure: INSERTION OF DIALYSIS CATHETER;  Surgeon: Waynetta Sandy, MD;  Location: [facility];  Service: Vascular;  Laterality: Right;   KIDNEY TRANSPLANT  1996   PERIPHERAL VASCULAR CATHETERIZATION Left 10/23/2016   Procedure: Fistulagram;  Surgeon: Elam Dutch, MD;  Location: [place] [facility];  Service: Cardiovascular;  Laterality: Left;   PERIPHERAL VASCULAR THROMBECTOMY Right 04/16/2020   Procedure: PERIPHERAL VASCULAR THROMBECTOMY;  Surgeon: Katha Cabal, MD;  Location: [place] [facility];  Service: Cardiovascular;  Laterality: Right;   RESECTION OF ARTERIOVENOUS FISTULA ANEURYSM Left 11/22/2015   Procedure: RESECTION OF LEFT RADIOCEPHALIC FISTULA ANEURYSM ;  Surgeon: Serafina Mitchell, MD;  Location: [place] [facility];  Service: Vascular;  Laterality: Left;   REVISON OF ARTERIOVENOUS FISTULA Left 12/22/2016   Procedure: REVISON OF LEFT ARTERIOVENOUS FISTULA;  Surgeon: Waynetta Sandy, MD;  Location: [facility];  Service: Vascular;  Laterality: Left;   REVISON OF ARTERIOVENOUS FISTULA Left 02/04/2017   Procedure: REVISON  OF LEFT UPPER ARM ARTERIOVENOUS FISTULA;  Surgeon: Waynetta Sandy, MD;  Location: [facility];  Service: Vascular;  Laterality: Left;   UPPER EXTREMITY ANGIOGRAPHY Bilateral 09/19/2019   Procedure: UPPER EXTREMITY ANGIOGRAPHY;  Surgeon: Katha Cabal, MD;  Location: [place] [facility];  Service: Cardiovascular;  Laterality: Bilateral;     OB History   No obstetric history on file.     Family History  Problem Relation Age of Onset   Colon cancer Brother    Cancer Brother    Coronary  artery disease Mother 20   Hyperlipidemia Mother    Hypertension Mother    Stroke Maternal Aunt    Esophageal cancer Neg Hx    Stomach cancer Neg Hx    Rectal cancer Neg Hx     Social History   Tobacco Use   Smoking status: Former Smoker    Types: Cigarettes    Quit date: 12/31/1991    Years since quitting: 28.3   Smokeless tobacco: Never Used  Substance Use Topics   Alcohol use: No    Alcohol/week: 0.0 standard drinks   Drug use: No    Home Medications Prior to Admission medications   Medication Sig Start Date End Date Taking? Authorizing Provider  acetaminophen (TYLENOL) 500 MG tablet Take 1 tablet (500 mg total) by mouth every 6 (six) hours as needed. Patient taking differently: Take 500 mg by mouth every 6 (six) hours as needed for mild pain.  09/05/18   Law, Bea Graff, PA-C  allopurinol (ZYLOPRIM) 300 MG tablet Take 150 mg by mouth daily.     [provider]  ALPRAZolam Duanne Moron) 0.25 MG tablet Take 0.25 mg by mouth daily as needed for anxiety.     [provider]  atorvastatin (LIPITOR) 40 MG tablet Take 1 tablet (40 mg total) by mouth daily. 04/02/20 05/02/20  Amin, Ankit Chirag, MD  AURYXIA 1 GM 210 MG(Fe) tablet Take 420 mg by mouth 3 (three) times daily with meals.     [provider]  B Complex-C-Folic Acid (DIALYVITE 937) 0.8 MG WAFR Take 1 tablet by mouth daily.  10/25/18   [provider]  carvedilol (COREG) 6.25 MG tablet Take 1 tablet (6.25 mg total) by mouth 2 (two) times daily with a meal. 04/02/20 05/02/20  Amin, Jeanella Flattery, MD  cinacalcet (SENSIPAR) 30 MG tablet Take 30 mg by mouth daily. 01/04/20   [provider]  HYDROcodone-acetaminophen (NORCO) 5-325 MG tablet Take 1-2 tablets by mouth every 6 (six) hours as needed for moderate pain or severe pain. 03/15/20   Schnier, Dolores Lory, MD  lactulose (CHRONULAC) 10 GM/15ML solution Take 20 g by mouth daily as needed for mild constipation.    [provider]    losartan (COZAAR) 50 MG tablet Take 50 mg by mouth daily. 04/23/20   [provider]  ondansetron (ZOFRAN) 8 MG tablet Take 8 mg by mouth 2 (two) times daily as needed for nausea or vomiting.     [provider]  zolpidem (AMBIEN) 10 MG tablet Take 10 mg by mouth at bedtime as needed for sleep.     [provider]    Allergies    Sulfa antibiotics and Adhesive [tape]  Review of Systems   Review of Systems  Constitutional: Positive for fatigue. Negative for chills and fever.  Respiratory: Positive for shortness of breath.   Cardiovascular: Positive for chest pain.  Gastrointestinal: Positive for nausea and vomiting. Negative for abdominal pain.  All other  systems reviewed and are negative.   Physical Exam Updated Vital Signs BP (!) 149/79    Pulse 88    Temp 98.2 F (36.8 C) (Oral)    Resp (!) 25    SpO2 100%   Physical Exam Vitals and nursing note reviewed.  Constitutional:      General: She is not in acute distress.    Appearance: She is well-developed.  HENT:     Head: Normocephalic and atraumatic.  Eyes:     General:        Right eye: No discharge.        Left eye: No discharge.     Conjunctiva/sclera: Conjunctivae normal.  Neck:     Vascular: No JVD.     Trachea: No tracheal deviation.  Cardiovascular:     Rate and Rhythm: Normal rate and regular rhythm.     Heart sounds: Murmur present.     Comments: AV fistula to the right upper extremity with palpable thrill.  2+ PT pulses bilaterally no lower extremity edema Pulmonary:     Breath sounds: Rales present.     Comments: Tachypneic, speaking in shorter phrases.  Bibasilar crackles noted.  SPO2 saturations 96% on 2 L supplemental oxygen Abdominal:     General: There is no distension.     Palpations: Abdomen is soft.     Tenderness: There is no abdominal tenderness. There is no guarding or rebound.  Skin:    General: Skin is warm and dry.     Findings: No erythema.  Neurological:      Mental Status: She is alert.  Psychiatric:        Behavior: Behavior normal.     ED Results / Procedures / Treatments   Labs (all labs ordered are listed, but only abnormal results are displayed) Labs Reviewed  BASIC METABOLIC PANEL - Abnormal; Notable for the following components:      Result Value   Potassium 5.5 (*)    Chloride 95 (*)    Glucose, Bld 117 (*)    BUN 72 (*)    Creatinine, Ser 8.59 (*)    GFR calc non Af Amer 4 (*)    GFR calc Af Amer 5 (*)    Anion gap 20 (*)    All other components within normal limits  CBC - Abnormal; Notable for the following components:   RBC 3.30 (*)    Hemoglobin 10.1 (*)    HCT 31.3 (*)    RDW 16.1 (*)    All other components within normal limits  TROPONIN I (HIGH SENSITIVITY) - Abnormal; Notable for the following components:   Troponin I (High Sensitivity) 72 (*)    All other components within normal limits  TROPONIN I (HIGH SENSITIVITY) - Abnormal; Notable for the following components:   Troponin I (High Sensitivity) 94 (*)    All other components within normal limits  SARS CORONAVIRUS 2 BY RT PCR [facility] ORDER, [place] LAB)    EKG EKG Interpretation  Date/Time:  Monday May 13 2020 12:07:35 EDT Ventricular Rate:  88 PR Interval:  182 QRS Duration: 78 QT Interval:  388 QTC Calculation: 469 R Axis:   69 Text Interpretation: Sinus rhythm with Premature atrial complexes Possible Anterior infarct , age undetermined Abnormal ECG since last tracing no significant change Confirmed by Daleen Bo [phone]) on 05/13/2020 12:52:19 PM   Radiology DG Chest 2 View  Result Date: 05/13/2020 CLINICAL DATA:  Chest pain with vomiting and lethargy.  Renal failure EXAM: CHEST - 2 VIEW COMPARISON:  April 01, 2020 FINDINGS: Lungs are somewhat hyperexpanded. There is cardiomegaly with pulmonary venous hypertension. There is interstitial edema. No airspace consolidation. There is aortic atherosclerosis. No adenopathy.  No bone lesions. IMPRESSION: Cardiomegaly with pulmonary vascular congestion. Interstitial edema. Appearance is consistent with a degree of congestive heart failure. No consolidation. Lungs are mildly hyperexpanded. Aortic Atherosclerosis (ICD10-I70.0). Electronically Signed   By: Lowella Grip III M.D.   On: 05/13/2020 13:16    Procedures .Critical Care Performed by: Renita Papa, PA-C Authorized by: Renita Papa, PA-C   Critical care provider statement:    Critical care time (minutes):  45   Critical care was necessary to treat or prevent imminent or life-threatening deterioration of the following conditions:  Respiratory failure and metabolic crisis   Critical care was time spent personally by me on the following activities:  Discussions with consultants, evaluation of patient's response to treatment, examination of patient, ordering and performing treatments and interventions, ordering and review of laboratory studies, ordering and review of radiographic studies, pulse oximetry, re-evaluation of patient's condition, obtaining history from patient or surrogate and review of old charts   (including critical care time)  Medications Ordered in ED Medications  Chlorhexidine Gluconate Cloth 2 % PADS 6 each (has no administration in time range)  sodium chloride flush (NS) 0.9 % injection 3 mL (3 mLs Intravenous Given 05/13/20 1412)  ondansetron (ZOFRAN) injection 4 mg (4 mg Intravenous Given 05/13/20 1411)    ED Course  I have reviewed the triage vital signs and the nursing notes.  Pertinent labs & imaging results that were available during my care of the patient were reviewed by me and considered in my medical decision making (see chart for details).    MDM Rules/Calculators/A&P                      Patient presenting for evaluation of shortness of breath, chest pains, generalized weakness, nausea and vomiting.  Was unable to be dialyzed today due to her symptoms.  She presents today  afebrile, SPO2 saturations as low as 91% on room air so was placed on 2 L supplemental oxygen with improvement.  She appears generally weak.  Bibasilar crackles noted on auscultation of the lungs.  Chest pain is not reproducible on palpation.  She does have dyspnea on exertion.  EKG today shows peak T waves, QT interval within normal limits.  She is mildly hyperkalemic with a potassium of 5.5 and remainder of lab work is consistent with history of ESRD.  Chest x-ray shows evidence of volume overload.  At this point I feel she would certainly benefit from dialysis.  CONSULT: Spoke with Dr. Jonnie Finner with nephrology who will assess the patient emergently in the ED.  At this time no temporizing measures were given as patient's EKG overall reassuring and she is hemodynamically stable at this time.  Initial troponin mildly elevated at 72 which appears to be elevated above patient's baseline in the 40s.  Repeat troponin is more elevated at 94.  I suspect this is likely in the setting of demand due to hypervolemia.  Cardiology head is assessed patient fairly recently at a recent admission and she had a normal stress test at that time.  I do feel she would benefit from admission for troponin trending due to high risk cardiac disease.  Spoke with Dr. Dione Plover with [facility] hospitalist service who agrees to some care of patient and bring  her into the hospital for further evaluation and management at this time.   Final Clinical Impression(s) / ED Diagnoses Final diagnoses:  ESRD on dialysis ([place])  Hyperkalemia  Other hypervolemia  Elevated troponin    Rx / DC Orders ED Discharge Orders    None       Debroah Baller 05/13/20 1924    Daleen Bo, MD 05/15/20 0041

## 2020-05-13 NOTE — H&P (Signed)
[facility] History and Physical  [name]:[id number] DOB: 1951-10-14 DOA: 05/13/2020  Referring physician: Eulis Foster, MD PCP: [name], MD   Chief Complaint: shortness of breath, chest pain  HPI: [name] is a 69 y.o. female with history of ESRD on HD (MWF), CHF, CAD, HLD, HTN, anemia, presents for multiple symptoms.   Reports she has been feeling weak since this morning. Denies any cold symptoms such as runny nose or cough. She has however had chest pain that has been constant since this morning since she woke up, feels like a bad indigestion. Also had some associated SOB. Difficult to ascertain whether or not it is related to exertion. Denies having any chest pain or SOB right at this moment.   Has also been feeling dehydrated. Denies drinking a lot of water but does report chewing on a lot of ice over the weekend. Denies missing any medications recently. She does endorse having some bad diarrhea just once this morning. Denies fevers.   In the ED reported she was waiting for transportation to HD when she had emesis and abdominal pain, at which point she called EMS. Last received HD on Friday per their history. On arrival patient w O2 down to 91%, placed on 2L O2 with improvement in symptoms and saturations, other VS unremarkable. BMP showed Cr 8.59, K 5.5, troponin 72>94, CBC at baseline, COVID test negative, CXR w signs of CHF but no consolidations, ECG showed mildly peaked T waves but no ischemic changes.  ED staff spoke with Nephrology who plan to dialyze once COVID swab result was back.   Chart review shows recent pharm-SPECT done on 02/20/2020 that showed normal perfusion throughout the heart. Recently admitted 04/01/2020 - 04/02/2020 for chest pain and SOB. Troponins elevated and seen by cardiology, no further workup at that time. Also recently admitted 03/15/2020 - 03/18/2020, initially for placement of RUE fistula for ESRD; immediate post-surgical course  complicated by shock/hypotension requiring pressors, stress cardiomyopathy and VT requiring DC cardioversion all in the setting of acute hyperkalemia. Patient did well after dialysis and discharged home after three days.   Review of Systems:  Pertinent positives and negative per HPI, all others reviewed and negative   Past Medical History:  Diagnosis Date  . Adenomatous polyp of colon 10/2010, 2006, 2015  . Anemia in CKD (chronic kidney disease) 11/07/2012   s/p blood transfusion.   . Arthritis   . CAD (coronary artery disease)    "something like that"  . CHF (congestive heart failure) ([place])   . Constipation   . Depression with anxiety   . Diverticula, colon   . 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  Had some early rejection then stable function for years, then had slow decline of function and went back on hemodialysis in 2012.  Gets HD TTS schedule at [facility] on [facility] still using L forearm AVF.     Marland Kitchen GERD (gastroesophageal reflux disease)   . GI bleed 2017   felt to be ischemic colitis, last colo 2015  . Headache   . Hyperlipidemia   . Hypertension   . Neurologic gait dysfunction   . Neuromuscular disorder (HCC)    neuropathy hand and legs  . Osteoporosis   . Pneumonia   . 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 Pulmonary edema 12/2019  . Stroke [facility]) 11/2015  TIA  . Weight loss, unintentional    Past Surgical History:  Procedure Laterality Date  . AV FISTULA PLACEMENT     for dialysis  . AV FISTULA PLACEMENT Left 11/22/2015   Procedure: ARTERIOVENOUS (AV) FISTULA CREATION-LEFT BRACHIOCEPHALIC;  Surgeon: Serafina Mitchell, MD;  Location: [place];  Service: Vascular;  Laterality: Left;  . AV FISTULA PLACEMENT Right 03/15/2020   Procedure: INSERTION OF ARTERIOVENOUS (AV) GORE-TEX GRAFT ARM ( BRACHIAL AXILLARY );  Surgeon: Katha Cabal, MD;  Location:  [facility];  Service: Vascular;  Laterality: Right;  . BACK SURGERY    . CERVICAL FUSION    . CHOLECYSTECTOMY  12/02/2012   Procedure: LAPAROSCOPIC CHOLECYSTECTOMY WITH INTRAOPERATIVE CHOLANGIOGRAM;  Surgeon: Edward Jolly, MD;  Location: [place];  Service: General;  Laterality: N/A;  . EYE SURGERY Bilateral    cataract surgery  . EYE SURGERY Left 2019   laser  . HEMATOMA EVACUATION Left 12/24/2016   Procedure: EVACUATION HEMATOMA LEFT UPPER ARM;  Surgeon: Waynetta Sandy, MD;  Location: [place];  Service: Vascular;  Laterality: Left;  . I & D EXTREMITY Left 12/31/2016   Procedure: IRRIGATION AND DEBRIDEMENT EXTREMITY;  Surgeon: Angelia Mould, MD;  Location: [place];  Service: Vascular;  Laterality: Left;  . INSERTION OF DIALYSIS CATHETER Right 12/24/2016   Procedure: INSERTION OF DIALYSIS CATHETER;  Surgeon: Waynetta Sandy, MD;  Location: [place];  Service: Vascular;  Laterality: Right;  . INSERTION OF DIALYSIS CATHETER Right 02/04/2017   Procedure: INSERTION OF DIALYSIS CATHETER;  Surgeon: Waynetta Sandy, MD;  Location: [place];  Service: Vascular;  Laterality: Right;  . KIDNEY TRANSPLANT  1996  . PERIPHERAL VASCULAR CATHETERIZATION Left 10/23/2016   Procedure: Fistulagram;  Surgeon: Elam Dutch, MD;  Location: [place] [facility];  Service: Cardiovascular;  Laterality: Left;  . PERIPHERAL VASCULAR THROMBECTOMY Right 04/16/2020   Procedure: PERIPHERAL VASCULAR THROMBECTOMY;  Surgeon: Katha Cabal, MD;  Location: [place] [facility];  Service: Cardiovascular;  Laterality: Right;  . RESECTION OF ARTERIOVENOUS FISTULA ANEURYSM Left 11/22/2015   Procedure: RESECTION OF LEFT RADIOCEPHALIC FISTULA ANEURYSM ;  Surgeon: Serafina Mitchell, MD;  Location: [place];  Service: Vascular;  Laterality: Left;  . REVISON OF ARTERIOVENOUS FISTULA Left 12/22/2016   Procedure: REVISON OF LEFT ARTERIOVENOUS FISTULA;  Surgeon: Waynetta Sandy, MD;  Location: [place];  Service:  Vascular;  Laterality: Left;  . REVISON OF ARTERIOVENOUS FISTULA Left 02/04/2017   Procedure: REVISON OF LEFT UPPER ARM ARTERIOVENOUS FISTULA;  Surgeon: Waynetta Sandy, MD;  Location: [place];  Service: Vascular;  Laterality: Left;  . UPPER EXTREMITY ANGIOGRAPHY Bilateral 09/19/2019   Procedure: UPPER EXTREMITY ANGIOGRAPHY;  Surgeon: Katha Cabal, MD;  Location: [place] [facility];  Service: Cardiovascular;  Laterality: Bilateral;   Social History:  reports that she quit smoking about 28 years ago. Her smoking use included cigarettes. She has never used smokeless tobacco. She reports that she does not drink alcohol or use drugs.  Allergies  Allergen Reactions  . Sulfa Antibiotics Other (See Comments)    Both parents allergic-so will not take  . Adhesive [Tape] Itching    Family History  Problem Relation Age of Onset  . Colon cancer Brother   . Cancer Brother   . Coronary artery disease Mother 64  . Hyperlipidemia Mother   . Hypertension Mother   . Stroke Maternal Aunt   . Esophageal cancer Neg Hx   . Stomach cancer Neg Hx   .  Rectal cancer Neg Hx      Prior to Admission medications   Medication Sig Start Date End Date Taking? Authorizing Provider  acetaminophen (TYLENOL) 500 MG tablet Take 1 tablet (500 mg total) by mouth every 6 (six) hours as needed. Patient taking differently: Take 500 mg by mouth every 6 (six) hours as needed for mild pain.  09/05/18   Law, Bea Graff, PA-C  allopurinol (ZYLOPRIM) 300 MG tablet Take 150 mg by mouth daily.     [provider]  ALPRAZolam Duanne Moron) 0.25 MG tablet Take 0.25 mg by mouth daily as needed for anxiety.     [provider]  atorvastatin (LIPITOR) 40 MG tablet Take 1 tablet (40 mg total) by mouth daily. 04/02/20 05/02/20  Amin, Ankit Chirag, MD  AURYXIA 1 GM 210 MG(Fe) tablet Take 420 mg by mouth 3 (three) times daily with meals.     [provider]  B Complex-C-Folic Acid (DIALYVITE 578) 0.8 MG WAFR  Take 1 tablet by mouth daily.  10/25/18   [provider]  carvedilol (COREG) 6.25 MG tablet Take 1 tablet (6.25 mg total) by mouth 2 (two) times daily with a meal. 04/02/20 05/02/20  Amin, Jeanella Flattery, MD  cinacalcet (SENSIPAR) 30 MG tablet Take 30 mg by mouth daily. 01/04/20   [provider]  HYDROcodone-acetaminophen (NORCO) 5-325 MG tablet Take 1-2 tablets by mouth every 6 (six) hours as needed for moderate pain or severe pain. 03/15/20   Schnier, Dolores Lory, MD  lactulose (CHRONULAC) 10 GM/15ML solution Take 20 g by mouth daily as needed for mild constipation.    [provider]  ondansetron (ZOFRAN) 8 MG tablet Take 8 mg by mouth 2 (two) times daily as needed for nausea or vomiting.     [provider]  zolpidem (AMBIEN) 10 MG tablet Take 10 mg by mouth at bedtime as needed for sleep.     [provider]   Physical Exam: Vitals:   05/13/20 1217 05/13/20 1234 05/13/20 1245 05/13/20 1821  BP: (!) 107/94 (!) 149/79    Pulse: 89 87 88   Resp: 14 (!) 26 (!) 25   Temp: 98.4 F (36.9 C)   98.2 F (36.8 C)  TempSrc: Oral   Oral  SpO2: 91% 96% 100%     Wt Readings from Last 3 Encounters:  04/04/20 49.4 kg  04/04/20 50.3 kg  04/02/20 52 kg    General:  Appears calm and comfortable Eyes: PERRL, normal lids, irises & conjunctiva ENT: grossly normal hearing, lips & tongue Neck: no LAD, masses or thyromegaly Cardiovascular: RRR, no m/r/g. No LE edema. JVD to ear lobe. Bilateral UE fistualas both w palpable thrills.  Telemetry: SR, no arrhythmias  Respiratory:  Normal respiratory effort. Some crackles at bases.  Abdomen: soft, ntnd Skin: no rash or induration seen on limited exam Musculoskeletal: grossly normal tone BUE/BLE Psychiatric: grossly normal mood and affect, speech fluent and appropriate Neurologic: grossly non-focal.          Labs on Admission:  Basic Metabolic Panel: Recent Labs  Lab 05/13/20 1227  NA 137  K 5.5*  CL 95*    CO2 22  GLUCOSE 117*  BUN 72*  CREATININE 8.59*  CALCIUM 9.1   Liver Function Tests: No results for input(s): AST, ALT, ALKPHOS, BILITOT, PROT, ALBUMIN in the last 168 hours. No results for input(s): LIPASE, AMYLASE in the last 168 hours. No results for input(s): AMMONIA in the last 168 hours. CBC: Recent Labs  Lab 05/13/20 1227  WBC 10.5  HGB 10.1*  HCT 31.3*  MCV 94.8  PLT 220   Cardiac Enzymes: No results for input(s): CKTOTAL, CKMB, CKMBINDEX, TROPONINI in the last 168 hours.  BNP (last 3 results) Recent Labs    11/20/19 0430 01/01/20 0539  BNP 3,362.4* 1,664.3*    ProBNP (last 3 results) No results for input(s): PROBNP in the last 8760 hours.  CBG: No results for input(s): GLUCAP in the last 168 hours.  Radiological Exams on Admission: DG Chest 2 View  Result Date: 05/13/2020 CLINICAL DATA:  Chest pain with vomiting and lethargy. Renal failure EXAM: CHEST - 2 VIEW COMPARISON:  April 01, 2020 FINDINGS: Lungs are somewhat hyperexpanded. There is cardiomegaly with pulmonary venous hypertension. There is interstitial edema. No airspace consolidation. There is aortic atherosclerosis. No adenopathy. No bone lesions. IMPRESSION: Cardiomegaly with pulmonary vascular congestion. Interstitial edema. Appearance is consistent with a degree of congestive heart failure. No consolidation. Lungs are mildly hyperexpanded. Aortic Atherosclerosis (ICD10-I70.0). Electronically Signed   By: Lowella Grip III M.D.   On: 05/13/2020 13:16    EKG: Independently reviewed. NSR, normal axis, some peaking of T waves throughout, no major changes from last ECG  Assessment/Plan Active Problems:   End-stage renal disease on hemodialysis (HCC)   Anemia in chronic kidney disease   Shortness of breath   HTN (hypertension)   Chronic diastolic CHF (congestive heart failure) (HCC)   Hyperkalemia   Volume overload  [name] is a 69 y.o. female with history of ESRD on HD (MWF), CHF,  CAD, HLD, HTN, anemia, presents with volume overload and hyperkalemia.   #Volume Overload Presenting w imaging findings of volume overload, JVD also markedly elevated on exam and mildly hypoxic. COVID swab negative. Unclear etiology though does admit to significant water intake 2/2 PICA with ice cubes. No missed HD sessions, denies missed meds. Prior w some chest pain though none currently, low suspicion for ACS given recent normal pharm SPECT stress test. Diurese via HD and reassess.  - appreciate renal consultation - to HD once COVID swab is negative  #Chest pain #Elevated troponins Currently reports NO chest pain. Earlier episode likely related to demand in setting of volume overload, reassuring that patient has normal recent stress test, no ischemic changes on ECG - monitor on tele - trend troponin, consider Cardiology consult if troponin cont to rise after HD  #Hyperkalemia Secondary to ESRD, patient does note occasional dietary indiscretions. Mild peaking but K<6, will receive HD shortly, defer acute treatment. - to HD once ready  #N/V #Emesis One reported episode to me, consider infectious testing if persistent.   #Known medical problems ESRD: cont cinacalcet Gout: cont allopurinol Anxiety: cont home xanax HLD: cont atorvastatin HTN: cont carvedilol Anemia: cont PO iron  Code Status: Full Code confirmed DVT Prophylaxis: lovenox 30 daily Family Communication: patient declined to have me update any family Disposition Plan:   Time spent: 26 min  Clarnce Flock MD/MPH [facility]

## 2020-05-13 NOTE — ED Triage Notes (Signed)
Patient arrived by [facility] with nausea and CP. Patient reports she has had dry heaving and missed her dialysis. Now complaining of chest tightness and burning. Alert and oriented

## 2020-05-13 NOTE — Progress Notes (Signed)
Patient with history of ESRD on HDHD almost 30 years - has history of failed tx 1997. She presented via EMS with nausea and CP.  She has a history of chronic dialysis.  She generally gets to her EDW at dialysis but did not her last two treatment.  Friday she left at 51.2 (EDW 49.5) with net UF 1.5 - lowest BP was 107/39 which is higher than her lowest usual SBP of 80s - 90s. Rales bilaterally - no sig LE edema -AVF dilated to neck. - Had declot of right arm brachail AVGG 04/17/19 Dr. Ronalee Belts. AVGG patent - not clear why not being used - can use today.  Work up in ED in progress.  Needs HD today K 5.5, some volume on CXR.   Usual dialysis orders  MWF [place] [facility] with 16 g DFR 600 left upper AVF and right upper AVGG 36 degrees profile 4 no heparin 4 hr Mircera 30 q 4 weeks - - last dose 5/17 - most recent hgb 9.8 [facility] [place]

## 2020-05-13 NOTE — ED Provider Notes (Signed)
  Face-to-face evaluation   History: She presents for evaluation of shortness of breath, nausea, vomiting and diarrhea.  Onset of the enteritis symptoms this morning.  She was on her way to get dialysis when she was diverted to the emergency department because the transport bus would not take her to dialysis.  Physical exam: Alert elderly female who is moderately uncomfortable.  Lungs with somewhat decreased air movement bilaterally.  Normal pulsation, access devices, left upper and right arms.  Medical screening examination/treatment/procedure(s) were conducted as a shared visit with non-physician practitioner(s) and myself.  I personally evaluated the patient during the encounter    Daleen Bo, MD 05/15/20 [phone]

## 2020-05-13 NOTE — ED Notes (Signed)
Got patient undress on the monitor patient is resting with call bell in reach 

## 2020-05-13 NOTE — Progress Notes (Signed)
New Admission Note: ? Arrival Method: via stretcher Mental Orientation: A/O x 4  Telemetry: Box #21 NSR Assessment: Completed Skin: Refer to flowsheet IV: Left hand  Pain: 0/10 Tubes: Safety Measures: Safety Fall Prevention Plan discussed with patient. Admission: Completed 5 [place] Orientation: Patient has been orientated to the room, unit and the staff.  Orders have been reviewed and are being implemented. Will continue to monitor the patient. Call light has been placed within reach and bed alarm has been activated.  ? [employer], [place]

## 2020-05-14 ENCOUNTER — Observation Stay (HOSPITAL_BASED_OUTPATIENT_CLINIC_OR_DEPARTMENT_OTHER): Payer: Medicare Other

## 2020-05-14 DIAGNOSIS — I214 Non-ST elevation (NSTEMI) myocardial infarction: Secondary | ICD-10-CM

## 2020-05-14 DIAGNOSIS — M109 Gout, unspecified: Secondary | ICD-10-CM | POA: Diagnosis not present

## 2020-05-14 DIAGNOSIS — I132 Hypertensive heart and chronic kidney disease with heart failure and with stage 5 chronic kidney disease, or end stage renal disease: Secondary | ICD-10-CM | POA: Diagnosis not present

## 2020-05-14 DIAGNOSIS — R072 Precordial pain: Secondary | ICD-10-CM

## 2020-05-14 DIAGNOSIS — I2510 Atherosclerotic heart disease of native coronary artery without angina pectoris: Secondary | ICD-10-CM | POA: Diagnosis not present

## 2020-05-14 DIAGNOSIS — I120 Hypertensive chronic kidney disease with stage 5 chronic kidney disease or end stage renal disease: Secondary | ICD-10-CM | POA: Diagnosis not present

## 2020-05-14 DIAGNOSIS — R778 Other specified abnormalities of plasma proteins: Secondary | ICD-10-CM | POA: Diagnosis not present

## 2020-05-14 DIAGNOSIS — Z992 Dependence on renal dialysis: Secondary | ICD-10-CM | POA: Diagnosis not present

## 2020-05-14 DIAGNOSIS — T8612 Kidney transplant failure: Secondary | ICD-10-CM | POA: Diagnosis not present

## 2020-05-14 DIAGNOSIS — I5033 Acute on chronic diastolic (congestive) heart failure: Secondary | ICD-10-CM | POA: Diagnosis not present

## 2020-05-14 DIAGNOSIS — N186 End stage renal disease: Secondary | ICD-10-CM | POA: Diagnosis not present

## 2020-05-14 DIAGNOSIS — E785 Hyperlipidemia, unspecified: Secondary | ICD-10-CM | POA: Diagnosis not present

## 2020-05-14 DIAGNOSIS — N2581 Secondary hyperparathyroidism of renal origin: Secondary | ICD-10-CM | POA: Diagnosis not present

## 2020-05-14 DIAGNOSIS — I361 Nonrheumatic tricuspid (valve) insufficiency: Secondary | ICD-10-CM

## 2020-05-14 DIAGNOSIS — G629 Polyneuropathy, unspecified: Secondary | ICD-10-CM | POA: Diagnosis not present

## 2020-05-14 DIAGNOSIS — R079 Chest pain, unspecified: Secondary | ICD-10-CM | POA: Diagnosis not present

## 2020-05-14 DIAGNOSIS — E875 Hyperkalemia: Secondary | ICD-10-CM | POA: Diagnosis not present

## 2020-05-14 DIAGNOSIS — R0602 Shortness of breath: Secondary | ICD-10-CM | POA: Diagnosis not present

## 2020-05-14 DIAGNOSIS — R748 Abnormal levels of other serum enzymes: Secondary | ICD-10-CM | POA: Diagnosis not present

## 2020-05-14 DIAGNOSIS — D631 Anemia in chronic kidney disease: Secondary | ICD-10-CM | POA: Diagnosis not present

## 2020-05-14 DIAGNOSIS — Z20822 Contact with and (suspected) exposure to covid-19: Secondary | ICD-10-CM | POA: Diagnosis not present

## 2020-05-14 HISTORY — DX: Non-ST elevation (NSTEMI) myocardial infarction: I21.4

## 2020-05-14 LAB — CBC
HCT: 32.5 % — ABNORMAL LOW (ref 36.0–46.0)
Hemoglobin: 10.4 g/dL — ABNORMAL LOW (ref 12.0–15.0)
MCH: 30.9 pg (ref 26.0–34.0)
MCHC: 32 g/dL (ref 30.0–36.0)
MCV: 96.4 fL (ref 80.0–100.0)
Platelets: 197 10*3/uL (ref 150–400)
RBC: 3.37 MIL/uL — ABNORMAL LOW (ref 3.87–5.11)
RDW: 16.2 % — ABNORMAL HIGH (ref 11.5–15.5)
WBC: 6.1 10*3/uL (ref 4.0–10.5)
nRBC: 0 % (ref 0.0–0.2)

## 2020-05-14 LAB — BASIC METABOLIC PANEL
Anion gap: 14 (ref 5–15)
BUN: 30 mg/dL — ABNORMAL HIGH (ref 8–23)
CO2: 26 mmol/L (ref 22–32)
Calcium: 8.2 mg/dL — ABNORMAL LOW (ref 8.9–10.3)
Chloride: 98 mmol/L (ref 98–111)
Creatinine, Ser: 5.06 mg/dL — ABNORMAL HIGH (ref 0.44–1.00)
GFR calc Af Amer: 9 mL/min — ABNORMAL LOW (ref >60)
GFR calc non Af Amer: 8 mL/min — ABNORMAL LOW (ref >60)
Glucose, Bld: 170 mg/dL — ABNORMAL HIGH (ref 70–99)
Potassium: 3.9 mmol/L (ref 3.5–5.1)
Sodium: 138 mmol/L (ref 135–145)

## 2020-05-14 LAB — TROPONIN I (HIGH SENSITIVITY)
Troponin I (High Sensitivity): 2174 ng/L (ref <18)
Troponin I (High Sensitivity): 2904 ng/L (ref <18)

## 2020-05-14 MED ORDER — ASPIRIN EC 81 MG PO TBEC: 81.0000 mg | DELAYED_RELEASE_TABLET | Freq: Every day | ORAL | Status: DC

## 2020-05-14 MED ORDER — ASPIRIN 81 MG PO TBEC: 81.0000 mg | DELAYED_RELEASE_TABLET | Freq: Every day | ORAL | Status: DC

## 2020-05-14 NOTE — Progress Notes (Signed)
Patient wanted to be discharged home. MD went ahead and cancelled her bloodwork for tomorrow and discharged her.

## 2020-05-14 NOTE — Progress Notes (Signed)
Echocardiogram 2D Echocardiogram has been performed.  Oneal Deputy Peytyn Trine 05/14/2020, 9:51 AM

## 2020-05-14 NOTE — Progress Notes (Addendum)
CRITICAL VALUE ALERT  Critical Value: Troponin 2174  Date & Time Notied: 05/14/2020 2707   Provider Notified: C. Fair, MD   Orders Received/Actions taken: EKG

## 2020-05-14 NOTE — Consult Note (Signed)
[facility] Renal Consultation Note    Indication for Consultation:  Management of ESRD/hemodialysis; anemia, hypertension/volume and secondary hyperparathyroidism  HPI: [name] is a 69 y.o. female with history of ESRD on HDHD almost 30 years - has history of failed tx 1997. She presented via EMS with nausea and CP.  She has a history of chronic diarrhea.  She generally gets to her EDW at dialysis but did not her last two treatment.  Friday she left at 51.2 (EDW 49.5) with net UF 1.5 - lowest BP was 107/39 which is higher than her lowest usual SBP of 80s - 90s. Rales bilaterally upon admission, improved today. no sig LE edema -left AVF dilated to neck. - Had declot of right arm brachail AVGG 04/17/19 Dr. Ronalee Belts. AVGG patent - not clear why not being used. Was used successfully Monday.  Breathing easier today but still on O2.  Loose stool this am, but not unusual for her.  She feels like she can go home.  Serial trop showed abrup increase to >2174 confirmed by recheck with increase to 2904.  Cardiology consulted for work up.  Past Medical History:  Diagnosis Date  . Adenomatous polyp of colon 10/2010, 2006, 2015  . Anemia in CKD (chronic kidney disease) 11/07/2012   s/p blood transfusion.   . Arthritis   . CAD (coronary artery disease)    "something like that"  . CHF (congestive heart failure) ([place])   . Constipation   . Depression with anxiety   . Diverticula, colon   . 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  Had some early rejection then stable function for years, then had slow decline of function and went back on hemodialysis in 2012.  Gets HD TTS schedule at [facility] on [facility] still using L forearm AVF.     Marland Kitchen GERD (gastroesophageal reflux disease)   . GI bleed 2017   felt to be ischemic colitis, last colo 2015  . Headache   . Hyperlipidemia   . Hypertension   .  Neurologic gait dysfunction   . Neuromuscular disorder (HCC)    neuropathy hand and legs  . Osteoporosis   . Pneumonia   . 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 Pulmonary edema 12/2019  . Stroke [facility]) 11/2015   TIA  . Weight loss, unintentional    Past Surgical History:  Procedure Laterality Date  . AV FISTULA PLACEMENT     for dialysis  . AV FISTULA PLACEMENT Left 11/22/2015   Procedure: ARTERIOVENOUS (AV) FISTULA CREATION-LEFT BRACHIOCEPHALIC;  Surgeon: Serafina Mitchell, MD;  Location: [place];  Service: Vascular;  Laterality: Left;  . AV FISTULA PLACEMENT Right 03/15/2020   Procedure: INSERTION OF ARTERIOVENOUS (AV) GORE-TEX GRAFT ARM ( BRACHIAL AXILLARY );  Surgeon: Katha Cabal, MD;  Location: [facility];  Service: Vascular;  Laterality: Right;  . BACK SURGERY    . CERVICAL FUSION    . CHOLECYSTECTOMY  12/02/2012   Procedure: LAPAROSCOPIC CHOLECYSTECTOMY WITH INTRAOPERATIVE CHOLANGIOGRAM;  Surgeon: Edward Jolly, MD;  Location: [place];  Service: General;  Laterality: N/A;  . EYE SURGERY Bilateral    cataract surgery  . EYE SURGERY Left 2019   laser  . HEMATOMA EVACUATION Left 12/24/2016   Procedure: EVACUATION HEMATOMA LEFT UPPER ARM;  Surgeon: Waynetta Sandy, MD;  Location: [place];  Service: Vascular;  Laterality:  Left;  . I & D EXTREMITY Left 12/31/2016   Procedure: IRRIGATION AND DEBRIDEMENT EXTREMITY;  Surgeon: Angelia Mould, MD;  Location: [place];  Service: Vascular;  Laterality: Left;  . INSERTION OF DIALYSIS CATHETER Right 12/24/2016   Procedure: INSERTION OF DIALYSIS CATHETER;  Surgeon: Waynetta Sandy, MD;  Location: [place];  Service: Vascular;  Laterality: Right;  . INSERTION OF DIALYSIS CATHETER Right 02/04/2017   Procedure: INSERTION OF DIALYSIS CATHETER;  Surgeon: Waynetta Sandy, MD;  Location: [place];  Service: Vascular;  Laterality: Right;  . KIDNEY TRANSPLANT  1996  . PERIPHERAL VASCULAR  CATHETERIZATION Left 10/23/2016   Procedure: Fistulagram;  Surgeon: Elam Dutch, MD;  Location: [place] CV LAB;  Service: Cardiovascular;  Laterality: Left;  . PERIPHERAL VASCULAR THROMBECTOMY Right 04/16/2020   Procedure: PERIPHERAL VASCULAR THROMBECTOMY;  Surgeon: Katha Cabal, MD;  Location: [place] CV LAB;  Service: Cardiovascular;  Laterality: Right;  . RESECTION OF ARTERIOVENOUS FISTULA ANEURYSM Left 11/22/2015   Procedure: RESECTION OF LEFT RADIOCEPHALIC FISTULA ANEURYSM ;  Surgeon: Serafina Mitchell, MD;  Location: [place];  Service: Vascular;  Laterality: Left;  . REVISON OF ARTERIOVENOUS FISTULA Left 12/22/2016   Procedure: REVISON OF LEFT ARTERIOVENOUS FISTULA;  Surgeon: Waynetta Sandy, MD;  Location: [place];  Service: Vascular;  Laterality: Left;  . REVISON OF ARTERIOVENOUS FISTULA Left 02/04/2017   Procedure: REVISON OF LEFT UPPER ARM ARTERIOVENOUS FISTULA;  Surgeon: Waynetta Sandy, MD;  Location: [place];  Service: Vascular;  Laterality: Left;  . UPPER EXTREMITY ANGIOGRAPHY Bilateral 09/19/2019   Procedure: UPPER EXTREMITY ANGIOGRAPHY;  Surgeon: Katha Cabal, MD;  Location: [place] CV LAB;  Service: Cardiovascular;  Laterality: Bilateral;   Family History  Problem Relation Age of Onset  . Colon cancer Brother   . Cancer Brother   . Coronary artery disease Mother 75  . Hyperlipidemia Mother   . Hypertension Mother   . Stroke Maternal Aunt   . Esophageal cancer Neg Hx   . Stomach cancer Neg Hx   . Rectal cancer Neg Hx    Social History:  reports that she quit smoking about 28 years ago. Her smoking use included cigarettes. She has never used smokeless tobacco. She reports that she does not drink alcohol or use drugs. Allergies  Allergen Reactions  . Sulfa Antibiotics Other (See Comments)    Both parents allergic-so will not take  . Adhesive [Tape] Itching   Prior to Admission medications   Medication Sig Start Date End Date Taking?  Authorizing Provider  acetaminophen (TYLENOL) 500 MG tablet Take 1 tablet (500 mg total) by mouth every 6 (six) hours as needed. Patient taking differently: Take 500 mg by mouth every 6 (six) hours as needed for mild pain.  09/05/18   Law, Bea Graff, PA-C  allopurinol (ZYLOPRIM) 300 MG tablet Take 150 mg by mouth daily.     [provider]  ALPRAZolam Duanne Moron) 0.25 MG tablet Take 0.25 mg by mouth daily as needed for anxiety.     [provider]  atorvastatin (LIPITOR) 40 MG tablet Take 1 tablet (40 mg total) by mouth daily. 04/02/20 05/02/20  Amin, Ankit Chirag, MD  AURYXIA 1 GM 210 MG(Fe) tablet Take 420 mg by mouth 3 (three) times daily with meals.     [provider]  B Complex-C-Folic Acid (DIALYVITE 191) 0.8 MG WAFR Take 1 tablet by mouth daily.  10/25/18   [provider]  carvedilol (COREG) 6.25 MG tablet Take  1 tablet (6.25 mg total) by mouth 2 (two) times daily with a meal. 04/02/20 05/02/20  Amin, Jeanella Flattery, MD  cinacalcet (SENSIPAR) 30 MG tablet Take 30 mg by mouth daily. 01/04/20   [provider]  HYDROcodone-acetaminophen (NORCO) 5-325 MG tablet Take 1-2 tablets by mouth every 6 (six) hours as needed for moderate pain or severe pain. 03/15/20   Schnier, Dolores Lory, MD  labetalol (NORMODYNE) 200 MG tablet Take 200 mg by mouth daily. 04/14/20   [provider]  lactulose (CHRONULAC) 10 GM/15ML solution Take 20 g by mouth daily as needed for mild constipation.    [provider]  losartan (COZAAR) 50 MG tablet Take 50 mg by mouth daily. 04/23/20   [provider]  ondansetron (ZOFRAN) 8 MG tablet Take 8 mg by mouth 2 (two) times daily as needed for nausea or vomiting.     [provider]  zolpidem (AMBIEN) 10 MG tablet Take 10 mg by mouth at bedtime as needed for sleep.     [provider]   Current Facility-Administered Medications  Medication Dose Route Frequency Provider Last Rate Last Admin  . 0.9 %   sodium chloride infusion  100 mL Intravenous PRN Alric Seton, PA-C      . 0.9 %  sodium chloride infusion  100 mL Intravenous PRN Alric Seton, PA-C      . acetaminophen (TYLENOL) tablet 650 mg  650 mg Oral Q6H PRN Clarnce Flock, MD       Or  . acetaminophen (TYLENOL) suppository 650 mg  650 mg Rectal Q6H PRN Clarnce Flock, MD      . allopurinol (ZYLOPRIM) tablet 150 mg  150 mg Oral Daily Clarnce Flock, MD   150 mg at 05/14/20 0850  . ALPRAZolam Duanne Moron) tablet 0.25 mg  0.25 mg Oral Brantley Persons, MD   0.25 mg at 05/14/20 0215  . alteplase (CATHFLO ACTIVASE) injection 2 mg  2 mg Intracatheter Once PRN Alric Seton, PA-C      . atorvastatin (LIPITOR) tablet 40 mg  40 mg Oral Daily Clarnce Flock, MD   40 mg at 05/14/20 0848  . carvedilol (COREG) tablet 6.25 mg  6.25 mg Oral BID WC Clarnce Flock, MD   6.25 mg at 05/14/20 0846  . Chlorhexidine Gluconate Cloth 2 % PADS 6 each  6 each Topical Q0600 Clarnce Flock, MD      . cinacalcet [facility]) tablet 30 mg  30 mg Oral Q breakfast Clarnce Flock, MD   30 mg at 05/14/20 0846  . enoxaparin (LOVENOX) injection 30 mg  30 mg Subcutaneous Q24H Clarnce Flock, MD   30 mg at 05/14/20 0538  . ferric citrate (AURYXIA) tablet 420 mg  420 mg Oral TID WC Clarnce Flock, MD   420 mg at 05/14/20 0846  . heparin injection 1,000 Units  1,000 Units Dialysis PRN Alric Seton, PA-C      . heparin injection 3,000 Units  3,000 Units Dialysis Once in dialysis Roney Jaffe, MD      . lactulose (CHRONULAC) 10 GM/15ML solution 20 g  20 g Oral Daily PRN Clarnce Flock, MD      . lidocaine (PF) (XYLOCAINE) 1 % injection 5 mL  5 mL Intradermal PRN Alric Seton, PA-C      . lidocaine-prilocaine (EMLA) cream 1 application  1 application Topical PRN Alric Seton, PA-C      . multivitamin (RENA-VIT) tablet 1 tablet  1 tablet Oral Daily Clarnce Flock, MD   1 tablet at 05/14/20 [phone]  .  pentafluoroprop-tetrafluoroeth (GEBAUERS) aerosol 1 application  1 application Topical PRN Alric Seton, PA-C      . polyethylene glycol (MIRALAX / GLYCOLAX) packet 17 g  17 g Oral Daily PRN Clarnce Flock, MD      . zolpidem (AMBIEN) tablet 5 mg  5 mg Oral QHS PRN Clarnce Flock, MD       Labs: Basic Metabolic Panel: Recent Labs  Lab 05/13/20 1227 05/14/20 0120  NA 137 138  K 5.5* 3.9  CL 95* 98  CO2 22 26  GLUCOSE 117* 170*  BUN 72* 30*  CREATININE 8.59* 5.06*  CALCIUM 9.1 8.2*   Liver Function Tests: No results for input(s): AST, ALT, ALKPHOS, BILITOT, PROT, ALBUMIN in the last 168 hours. No results for input(s): LIPASE, AMYLASE in the last 168 hours. No results for input(s): AMMONIA in the last 168 hours. CBC: Recent Labs  Lab 05/13/20 1227 05/14/20 0120  WBC 10.5 6.1  HGB 10.1* 10.4*  HCT 31.3* 32.5*  MCV 94.8 96.4  PLT 220 197    Results for [name], [name] (MRN [id number]) as of 05/14/2020 12:01  Ref. Range 04/02/2020 09:16 05/13/2020 12:27 05/13/2020 14:20 05/14/2020 01:20 05/14/2020 06:19  Troponin I (High Sensitivity) Latest Ref Range: <18 ng/L  72 (H) 94 (H) 2,174 (HH) 2,904 (HH)   Studies/Results: DG Chest 2 View  Result Date: 05/13/2020 CLINICAL DATA:  Chest pain with vomiting and lethargy. Renal failure EXAM: CHEST - 2 VIEW COMPARISON:  April 01, 2020 FINDINGS: Lungs are somewhat hyperexpanded. There is cardiomegaly with pulmonary venous hypertension. There is interstitial edema. No airspace consolidation. There is aortic atherosclerosis. No adenopathy. No bone lesions. IMPRESSION: Cardiomegaly with pulmonary vascular congestion. Interstitial edema. Appearance is consistent with a degree of congestive heart failure. No consolidation. Lungs are mildly hyperexpanded. Aortic Atherosclerosis (ICD10-I70.0). Electronically Signed   By: Lowella Grip III M.D.   On: 05/13/2020 13:16    ROS: As per HPI otherwise negative.  Physical Exam: Vitals:   05/13/20  2230 05/13/20 2359 05/14/20 0500 05/14/20 0849  BP: 113/61 (!) 124/42 (!) 124/51 (!) 102/39  Pulse: 82 88 99 89  Resp: 18 18 18 14   Temp: 98.2 F (36.8 C) 99.2 F (37.3 C) 99 F (37.2 C) (!) 97.5 F (36.4 C)  TempSrc: Oral Oral Oral Oral  SpO2: 100% 100% 98% 95%  Weight: 52 kg 49.1 kg       General:  Thin frail female - NAD Head: NCAT sclera not icteric MMM Neck: prominent enlarged AVF extends into left neck and centrally Lungs: dim BS crackles bases Heart: RRR with S1 S2.  Abdomen: soft NT + BS Lower extremities:without edema or ischemic changes, no open wounds  Neuro: A & O  X 3. Moves all extremities spontaneously. Psych:  Responds to questions appropriately with a normal affect. Dialysis Access: right upper AVGG + bruit - and left AVF that extends into the neck - markedly enlarged   Dialysis orders: MWF [place] 350 with 16 g DFR 600 left upper AVF and right upper AVGG 36 degrees profile 4 no heparin 4 hr Mircera 30 q 4 weeks - - last dose 5/17 - most recent hgb 9.8 Hectorol 3  Assessment/Plan: 1. + troponins> 2000 - card being consulted  possible NSTEMI 2. ESRD -MWF - post HD wt variance noted yesterday - weight again this am - EDW is 33-  use right  Price successfully yesterday - has massively elongated left AVF to neck 3. Anemia - hgb 10.4- not due for redose 4. Secondary hyperparathyroidism - continue hectorol 5. HTN/volume - current meds/UF to EDW or lower - needs standing wts for accuracy 6. Nutrition - renal diet 7. OBS status - full code  Myriam Jacobson, [place]hal [place] [phone] 05/14/2020, 12:04 PM

## 2020-05-14 NOTE — Plan of Care (Signed)
  Problem: Education: Goal: Knowledge of General Education information will improve Description: Including pain rating scale, medication(s)/side effects and non-pharmacologic comfort measures Outcome: Progressing   Problem: Activity: Goal: Risk for activity intolerance will decrease Outcome: Progressing   

## 2020-05-14 NOTE — Discharge Summary (Signed)
Physician Discharge Summary  [name] HFW:[id number] DOB: 07-16-1951 DOA: 05/13/2020  PCP: [name], MD  Admit date: 05/13/2020 Discharge date: 05/14/2020  Admitted From: home Discharge disposition: home   Recommendations for Outpatient Follow-Up:   #1.  Follow-up with primary cardiology 1 week for evaluation of chest pain and need for outpatient cath #2 dialysis per your regular schedule  Discharge Diagnosis:   Principal Problem:   Chest pain Active Problems:   End-stage renal disease on hemodialysis (HCC)   HTN (hypertension)   Acute on chronic diastolic heart failure (HCC)   Hyperkalemia   Volume overload   Anemia in chronic kidney disease   NSTEMI (non-ST elevated myocardial infarction) [facility])    Discharge Condition: Improved.  Diet recommendation: Low sodium, heart healthy.    Wound care: None.  Code status: Full.   History of Present Illness:   This history is a 69 year old with a history of end-stage renal disease a Monday Wednesday Friday dialysis patient, CHF, CAD, hypertension, admitted May 31 with chest pain and shortness of breath.  Patient reported feeling weak and experiencing left anterior chest pain.  She equated it to "bad indigestion".  Associated symptoms included some shortness of breath.  She denied any dizziness headache syncope or near syncope.  She states she has felt dehydrated and while she has not been drinking a lot of water she has been chewing a lot of ice.  She had one episode of emesis while waiting for EMT.  She reports compliance with her dialysis.  In the emergency department oxygen saturation level 91% on room air she was provided with supplemental oxygen.  She had a potassium of 5.5 and an elevated high-sensitivity troponin.  EKG showed mildly peaked T waves but no ischemic changes.  Work-up revealed HF exacerbation and she was dialyzed   Hospital Course by Problem:   Chest pain/elevated troponins.  Concern for  STEMI.  Chest pain has resolved.HS troponin 72>94>2174>2904.  Initial EKG with sinus rhythm.  Echo results pending.  Chart review indicates patient recently had a normal stress test.  No events on telemetry.  A weighted by cardiology who opined chest pain was resolved with hemodialysis indicating it may be CHF related.  Cardiology also indicated today's echo and wall motion normal.  EKG also reviewed by cardiology and shows sinus with no changes to suggest ischemia.  Also recommended cardiac cath or coronary CTA but patient refused to consider.  Cardiology indicated patient could be discharged home today and they will arrange follow-up to discuss coronary CTA.  She is pain-free at the time of admission.   Volume overload/acute on chronic diastolic heart failure.  Chest x-ray reveals cardiomegaly with pulmonary vascular congestion and interstitial edema consistent with a degree of congestive heart failure.  Also has increased oxygen demand with an oxygen saturation level of 91% on room air at presentation.  Positive JVD on exam.  She reports compliance with hemodialysis.  She was dialyzed yesterday per her regular schedule.  She admits to some dietary indiscretions.  She was evaluated by nephrology and dialyzed yesterday per her regular schedule.  Oxygen saturation level greater than 90% on room air at discharge.  She will resume her home/outpatient dialysis schedule.  Hyperkalemia.  Resolved since dialysis yesterday.  End-stage renal disease.  She is a Monday Wednesday Friday dialysis schedule.  She was dialyzed yesterday per her regular schedule.  Has been instructed to resume her regular schedule outpatient.  Hypertension.  Blood pressures were  on the low end of normal during her hospitalization.  Anemia of chronic disease.  Hemoglobin 10.4 which is close to her baseline.  No sign symptoms of active bleeding  Anxiety.  Stable at baseline.  Home medications include Xanax     Medical Consultants:     ccalhany cards  [facility] nephrology   Discharge Exam:   Vitals:   05/14/20 0849 05/14/20 1214  BP: (!) 102/39 (!) 120/51  Pulse: 89 92  Resp: 14 18  Temp: (!) 97.5 F (36.4 C) 98.4 F (36.9 C)  SpO2: 95% 96%   Vitals:   05/13/20 2359 05/14/20 0500 05/14/20 0849 05/14/20 1214  BP: (!) 124/42 (!) 124/51 (!) 102/39 (!) 120/51  Pulse: 88 99 89 92  Resp: 18 18 14 18   Temp: 99.2 F (37.3 C) 99 F (37.2 C) (!) 97.5 F (36.4 C) 98.4 F (36.9 C)  TempSrc: Oral Oral Oral Oral  SpO2: 100% 98% 95% 96%  Weight: 49.1 kg       General exam: Appears calm and comfortable. No acute distress Respiratory system: Clear to auscultation. Respiratory effort normal. Cardiovascular system: S1 & S2 heard, RRR. No JVD,  rubs, gallops or clicks. No murmurs. Gastrointestinal system: Abdomen is nondistended, soft and nontender. No organomegaly or masses felt. Normal bowel sounds heard. Central nervous system: Alert and oriented. No focal neurological deficits. Extremities: No clubbing,  or cyanosis. No edema. Skin: No rashes, lesions or ulcers. Psychiatry: Judgement and insight appear normal. Mood & affect appropriate.    The results of significant diagnostics from this hospitalization (including imaging, microbiology, ancillary and laboratory) are listed below for reference.     Procedures and Diagnostic Studies:   DG Chest 2 View  Result Date: 05/13/2020 CLINICAL DATA:  Chest pain with vomiting and lethargy. Renal failure EXAM: CHEST - 2 VIEW COMPARISON:  April 01, 2020 FINDINGS: Lungs are somewhat hyperexpanded. There is cardiomegaly with pulmonary venous hypertension. There is interstitial edema. No airspace consolidation. There is aortic atherosclerosis. No adenopathy. No bone lesions. IMPRESSION: Cardiomegaly with pulmonary vascular congestion. Interstitial edema. Appearance is consistent with a degree of congestive heart failure. No consolidation. Lungs are mildly hyperexpanded. Aortic  Atherosclerosis (ICD10-I70.0). Electronically Signed   By: Lowella Grip III M.D.   On: 05/13/2020 13:16     Labs:   Basic Metabolic Panel: Recent Labs  Lab 05/13/20 1227 05/14/20 0120  NA 137 138  K 5.5* 3.9  CL 95* 98  CO2 22 26  GLUCOSE 117* 170*  BUN 72* 30*  CREATININE 8.59* 5.06*  CALCIUM 9.1 8.2*   GFR Estimated Creatinine Clearance: 8.1 mL/min (A) (by C-G formula based on SCr of 5.06 mg/dL (H)). Liver Function Tests: No results for input(s): AST, ALT, ALKPHOS, BILITOT, PROT, ALBUMIN in the last 168 hours. No results for input(s): LIPASE, AMYLASE in the last 168 hours. No results for input(s): AMMONIA in the last 168 hours. Coagulation profile No results for input(s): INR, PROTIME in the last 168 hours.  CBC: Recent Labs  Lab 05/13/20 1227 05/14/20 0120  WBC 10.5 6.1  HGB 10.1* 10.4*  HCT 31.3* 32.5*  MCV 94.8 96.4  PLT 220 197   Cardiac Enzymes: No results for input(s): CKTOTAL, CKMB, CKMBINDEX, TROPONINI in the last 168 hours. BNP: Invalid input(s): POCBNP CBG: No results for input(s): GLUCAP in the last 168 hours. D-Dimer No results for input(s): DDIMER in the last 72 hours. Hgb A1c No results for input(s): HGBA1C in the last 72 hours. Lipid Profile No results  for input(s): CHOL, HDL, LDLCALC, TRIG, CHOLHDL, LDLDIRECT in the last 72 hours. Thyroid function studies No results for input(s): TSH, T4TOTAL, T3FREE, THYROIDAB in the last 72 hours.  Invalid input(s): FREET3 Anemia work up No results for input(s): VITAMINB12, FOLATE, FERRITIN, TIBC, IRON, RETICCTPCT in the last 72 hours. Microbiology Recent Results (from the past 240 hour(s))  SARS Coronavirus 2 by RT PCR (hospital order, performed in [facility] hospital lab) Nasopharyngeal Nasopharyngeal Swab     Status: None   Collection Time: 05/13/20  4:15 PM   Specimen: Nasopharyngeal Swab  Result Value Ref Range Status   SARS Coronavirus 2 NEGATIVE NEGATIVE Final    Comment:  (NOTE) SARS-CoV-2 target nucleic acids are NOT DETECTED. The SARS-CoV-2 RNA is generally detectable in upper and lower respiratory specimens during the acute phase of infection. The lowest concentration of SARS-CoV-2 viral copies this assay can detect is 250 copies / mL. A negative result does not preclude SARS-CoV-2 infection and should not be used as the sole basis for treatment or other patient management decisions.  A negative result may occur with improper specimen collection / handling, submission of specimen other than nasopharyngeal swab, presence of viral mutation(s) within the areas targeted by this assay, and inadequate number of viral copies (<250 copies / mL). A negative result must be combined with clinical observations, patient history, and epidemiological information. Fact Sheet for Patients:   StrictlyIdeas.no Fact Sheet for Healthcare Providers: BankingDealers.co.za This test is not yet approved or cleared  by the [place] FDA and has been authorized for detection and/or diagnosis of SARS-CoV-2 by FDA under an Emergency Use Authorization (EUA).  This EUA will remain in effect (meaning this test can be used) for the duration of the COVID-19 declaration under Section 564(b)(1) of the Act, 21 U.S.C. section 360bbb-3(b)(1), unless the authorization is terminated or revoked sooner. Performed at [facility], [place] [address] [place],  [address]      Discharge Instructions:   Discharge Instructions    Call MD for:  persistant dizziness or light-headedness   Complete by: As directed    Call MD for:  persistant nausea and vomiting   Complete by: As directed    Call MD for:  severe uncontrolled pain   Complete by: As directed    Call MD for:  temperature >100.4   Complete by: As directed    Diet - low sodium heart healthy   Complete by: As directed    Discharge instructions   Complete by: As directed     Take medications as prescribed Follow up with primary cardiology 1 week for evaluation of chest pain and need for cath Dialysis per usual schedule   Increase activity slowly   Complete by: As directed      Allergies as of 05/14/2020      Reactions   Sulfa Antibiotics Other (See Comments)   Both parents allergic-so will not take   Adhesive [tape] Itching      Medication List    TAKE these medications   acetaminophen 500 MG tablet Commonly known as: TYLENOL Take 1 tablet (500 mg total) by mouth every 6 (six) hours as needed. What changed: reasons to take this   allopurinol 300 MG tablet Commonly known as: ZYLOPRIM Take 150 mg by mouth daily.   ALPRAZolam 0.25 MG tablet Commonly known as: XANAX Take 0.25 mg by mouth daily as needed for anxiety.   aspirin 81 MG EC tablet Take 1 tablet (81 mg total) by  mouth daily.   atorvastatin 40 MG tablet Commonly known as: Lipitor Take 1 tablet (40 mg total) by mouth daily.   Auryxia 1 GM 210 MG(Fe) tablet Generic drug: ferric citrate Take 420 mg by mouth 3 (three) times daily with meals.   carvedilol 6.25 MG tablet Commonly known as: COREG Take 1 tablet (6.25 mg total) by mouth 2 (two) times daily with a meal.   cinacalcet 30 MG tablet Commonly known as: SENSIPAR Take 30 mg by mouth daily.   Dialyvite 800 0.8 MG Wafr Take 1 tablet by mouth daily.   HYDROcodone-acetaminophen 5-325 MG tablet Commonly known as: Norco Take 1-2 tablets by mouth every 6 (six) hours as needed for moderate pain or severe pain.   labetalol 200 MG tablet Commonly known as: NORMODYNE Take 200 mg by mouth daily.   lactulose 10 GM/15ML solution Commonly known as: CHRONULAC Take 20 g by mouth daily as needed for mild constipation.   losartan 50 MG tablet Commonly known as: COZAAR Take 50 mg by mouth daily.   ondansetron 8 MG tablet Commonly known as: ZOFRAN Take 8 mg by mouth 2 (two) times daily as needed for nausea or vomiting.   zolpidem  10 MG tablet Commonly known as: AMBIEN Take 10 mg by mouth at bedtime as needed for sleep.         Time coordinating discharge: 40 minutes  Signed:  Radene Gunning NP  [facility] 05/14/2020, 2:48 PM

## 2020-05-14 NOTE — Plan of Care (Signed)

## 2020-05-14 NOTE — Progress Notes (Signed)
Progress Note    [name]  ONG:[id number] DOB: Jun 10, 1951  DOA: 05/13/2020 PCP: Renford Dills, MD    Brief Narrative:     Medical records reviewed and are as summarized below:  [name] is an 69 y.o. female with a past medical history that includes end-stage renal disease on Monday Wednesday Friday dialysis patient, CHF, CAD, hypertension, anemia admitted May 31 for chest pain accompanied by elevated troponins as well as volume overload presumably related to dietary indiscretions.  Assessment/Plan:   Principal Problem:   Chest pain Active Problems:   End-stage renal disease on hemodialysis (HCC)   HTN (hypertension)   Acute on chronic diastolic heart failure (HCC)   Hyperkalemia   Volume overload   Anemia in chronic kidney disease  1.  Chest pain/elevated troponins.  Concern for STEMI.  Pain-free at the time of my exam.  HS troponin 72>94>2174>2904.  Initial EKG with sinus rhythm.  Echo results pending.  Chart review indicates patient recently had a normal stress test.  No events on telemetry -Continue telemetry -Await cardiology recommendations -Follow echo results -Supportive therapy  #2.  Volume overload/acute on chronic diastolic heart failure.   Chest x-ray reveals cardiomegaly with pulmonary vascular congestion and interstitial edema consistent with a degree of congestive heart failure.  Also has increased oxygen demand with an oxygen saturation level of 91% on room air. +JVD. Patient reports compliance with hemodialysis.  Does indicate some dietary indiscretions.  Valuated by nephrology who ordered dialysis yesterday. -Daily weights -Output -Dialysis per nephrology -Follow echo results  #3.  Hyperkalemia.  Resolved since dialysis yesterday -Monitor  #4.  End-stage renal disease a Monday Wednesday Friday dialysis schedule -See above -Cruciate nephrology's assistance  #5.  Hypertension.  Are low end of normal.  Home medications include  losartan, Coreg -Continue Coreg with parameters -Hold all other antihypertensives -Monitor  #6.  Anemia and chronic disease.  Hemoglobin 10.4.  No sign symptoms of active bleeding  #7.  Anxiety.  Stable at baseline. -Continue home Xanax    Family Communication/Anticipated D/C date and plan/Code Status   DVT prophylaxis: heparin ordered. Code Status: Full Code.  Family Communication: patient Disposition Plan: Status is: Observation  The patient remains OBS appropriate and will d/c before 2 midnights.  Dispo: The patient is from: Home              Anticipated d/c is to: Home              Anticipated d/c date is: 1 day              Patient currently is not medically stable to d/c.         Medical Consultants:    Cardiology  nephrology   Anti-Infectives:    None  Subjective:   Lying in bed eyes closed denies pain or discomfort.   Objective:    Vitals:   05/13/20 2230 05/13/20 2359 05/14/20 0500 05/14/20 0849  BP: 113/61 (!) 124/42 (!) 124/51 (!) 102/39  Pulse: 82 88 99 89  Resp: 18 18 18 14   Temp: 98.2 F (36.8 C) 99.2 F (37.3 C) 99 F (37.2 C) (!) 97.5 F (36.4 C)  TempSrc: Oral Oral Oral Oral  SpO2: 100% 100% 98% 95%  Weight: 52 kg 49.1 kg      Intake/Output Summary (Last 24 hours) at 05/14/2020 1138 Last data filed at 05/14/2020 0800 Gross per 24 hour  Intake 120 ml  Output 2100 ml  Net -1980  ml   Filed Weights   05/13/20 1837 05/13/20 2230 05/13/20 2359  Weight: 54.2 kg 52 kg 49.1 kg    Exam: General: Thin frail somewhat chronically ill-appearing reluctant to participate in interview no acute distress CV: Regular rate and rhythm no murmur gallop or rub no lower extremity edema chest is nontender to palpation Respiratory: No increased work of breathing breath sounds are distant fine crackles bilateral bases I hear no wheeze Abdomen soft flat positive bowel sounds nontender to palpation no guarding or rebounding Musculoskeletal: Joints  without swelling/erythema full range of motion Neuro: Awake alert oriented x3 unengaging follow simple commands  Data Reviewed:   I have personally reviewed following labs and imaging studies:  Labs: Labs show the following:   Basic Metabolic Panel: Recent Labs  Lab 05/13/20 1227 05/14/20 0120  NA 137 138  K 5.5* 3.9  CL 95* 98  CO2 22 26  GLUCOSE 117* 170*  BUN 72* 30*  CREATININE 8.59* 5.06*  CALCIUM 9.1 8.2*   GFR Estimated Creatinine Clearance: 8.1 mL/min (A) (by C-G formula based on SCr of 5.06 mg/dL (H)). Liver Function Tests: No results for input(s): AST, ALT, ALKPHOS, BILITOT, PROT, ALBUMIN in the last 168 hours. No results for input(s): LIPASE, AMYLASE in the last 168 hours. No results for input(s): AMMONIA in the last 168 hours. Coagulation profile No results for input(s): INR, PROTIME in the last 168 hours.  CBC: Recent Labs  Lab 05/13/20 1227 05/14/20 0120  WBC 10.5 6.1  HGB 10.1* 10.4*  HCT 31.3* 32.5*  MCV 94.8 96.4  PLT 220 197   Cardiac Enzymes: No results for input(s): CKTOTAL, CKMB, CKMBINDEX, TROPONINI in the last 168 hours. BNP (last 3 results) No results for input(s): PROBNP in the last 8760 hours. CBG: No results for input(s): GLUCAP in the last 168 hours. D-Dimer: No results for input(s): DDIMER in the last 72 hours. Hgb A1c: No results for input(s): HGBA1C in the last 72 hours. Lipid Profile: No results for input(s): CHOL, HDL, LDLCALC, TRIG, CHOLHDL, LDLDIRECT in the last 72 hours. Thyroid function studies: No results for input(s): TSH, T4TOTAL, T3FREE, THYROIDAB in the last 72 hours.  Invalid input(s): FREET3 Anemia work up: No results for input(s): VITAMINB12, FOLATE, FERRITIN, TIBC, IRON, RETICCTPCT in the last 72 hours. Sepsis Labs: Recent Labs  Lab 05/13/20 1227 05/14/20 0120  WBC 10.5 6.1    Microbiology Recent Results (from the past 240 hour(s))  SARS Coronavirus 2 by RT PCR (hospital order, performed in [facility] hospital lab) Nasopharyngeal Nasopharyngeal Swab     Status: None   Collection Time: 05/13/20  4:15 PM   Specimen: Nasopharyngeal Swab  Result Value Ref Range Status   SARS Coronavirus 2 NEGATIVE NEGATIVE Final    Comment: (NOTE) SARS-CoV-2 target nucleic acids are NOT DETECTED. The SARS-CoV-2 RNA is generally detectable in upper and lower respiratory specimens during the acute phase of infection. The lowest concentration of SARS-CoV-2 viral copies this assay can detect is 250 copies / mL. A negative result does not preclude SARS-CoV-2 infection and should not be used as the sole basis for treatment or other patient management decisions.  A negative result may occur with improper specimen collection / handling, submission of specimen other than nasopharyngeal swab, presence of viral mutation(s) within the areas targeted by this assay, and inadequate number of viral copies (<250 copies / mL). A negative result must be combined with clinical observations, patient history, and epidemiological information. Fact Sheet for Patients:  BoilerBrush.com.cy Fact Sheet for Healthcare Providers: https://pope.com/ This test is not yet approved or cleared  by the [place] FDA and has been authorized for detection and/or diagnosis of SARS-CoV-2 by FDA under an Emergency Use Authorization (EUA).  This EUA will remain in effect (meaning this test can be used) for the duration of the COVID-19 declaration under Section 564(b)(1) of the Act, 21 U.S.C. section 360bbb-3(b)(1), unless the authorization is terminated or revoked sooner. Performed at [facility], [address] [place], [place] [address]     Procedures and diagnostic studies:  DG Chest 2 View  Result Date: 05/13/2020 CLINICAL DATA:  Chest pain with vomiting and lethargy. Renal failure EXAM: CHEST - 2 VIEW COMPARISON:  April 01, 2020 FINDINGS: Lungs are somewhat  hyperexpanded. There is cardiomegaly with pulmonary venous hypertension. There is interstitial edema. No airspace consolidation. There is aortic atherosclerosis. No adenopathy. No bone lesions. IMPRESSION: Cardiomegaly with pulmonary vascular congestion. Interstitial edema. Appearance is consistent with a degree of congestive heart failure. No consolidation. Lungs are mildly hyperexpanded. Aortic Atherosclerosis (ICD10-I70.0). Electronically Signed   By: Bretta Bang III M.D.   On: 05/13/2020 13:16    Medications:    allopurinol  150 mg Oral Daily   ALPRAZolam  0.25 mg Oral QODAY   atorvastatin  40 mg Oral Daily   carvedilol  6.25 mg Oral BID WC   Chlorhexidine Gluconate Cloth  6 each Topical Q0600   cinacalcet  30 mg Oral Q breakfast   enoxaparin (LOVENOX) injection  30 mg Subcutaneous Q24H   ferric citrate  420 mg Oral TID WC   heparin  3,000 Units Dialysis Once in dialysis   multivitamin  1 tablet Oral Daily   Continuous Infusions:  sodium chloride     sodium chloride       LOS: 0 days   Jamya Starry MNP [facility]   How to contact the [facility] Attending or Consulting provider 7A - 7P or covering provider during after hours 7P -7A, for this patient?  1. Check the care team in [facility] and look for a) attending/consulting [facility] provider listed and b) the [facility] team listed 2. Log into [email or web address] and use [place]'s universal password to access. If you do not have the password, please contact the hospital operator. 3. Locate the [facility] provider you are looking for under [facility] and page to a number that you can be directly reached. 4. If you still have difficulty reaching the provider, please page the [facility] (Director on Call) for the Hospitalists listed on amion for assistance.  05/14/2020, 11:38 AM

## 2020-05-14 NOTE — Progress Notes (Addendum)
Paged by RN about elevated Trop value. Patient asymptomatic. EKG ordered. Paged Cardiology and awaiting call back. Paged PA twice without reply.  Paged Dr. Percival Spanish. He requested MRN be sent through Epic and will see patient.  Barrington Ellison, MD [facility]

## 2020-05-14 NOTE — Consult Note (Addendum)
Cardiology Consultation:   Patient ID: [name]; [id number]; January 06, 1951   Admit date: 05/13/2020 Date of Consult: 05/14/2020  Primary Care Provider: [name], MD Primary Cardiologist: [name], MD Primary Electrophysiologist:  None   Patient Profile:   [name] is a 69 y.o. 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 Dione Plover.  History of Present Illness:   [name] was in her usual state of health until the morning of 05/13/20 when she began experiencing chest pain which felt like indigestion. She had associated SOB. Pain persisted and she subsequently developed emesis and abdominal pain, prompting her to activate EMS.  She was hospitalized 03/2020 for placement of a RUE fistula, though post-surgical course was complicated by shock/hypotension and hyperkalemia resulting in sustained VT for which she was cardioverted with ultimate return to sinus rhythm. Echo at that time showed a new wall motion abnormality which was felt to be a possible stress cardiomyopathy in the setting of hypotension and recent surgery. She was subsequently admitted later that month with chest pain and SOB. Trops were mildly elevated at that time to the 50s therefore an ischemic evaluation was deferred. Her last ischemic evaluation was a NST 02/2020 which was without ischemia. Her last echocardiogram 03/15/20 showed EF 50-55%, G1DD, distal anteroseptal and apical wall motion abnormality c/f conduction abnormality, moderate LAE, mild RAE, and moderate to severe TR.   At the time of this evaluation, she reports she feels great. She attributes her chest tightness and SOB to extra fluid on board. She underwent dialysis yesterday and her symptoms have resolved. She is a poor historian and has difficulty characterizing her chest pain or whether she  experiences chest pain with activity. She reports she has been living with her mother who has dementia and this has been stressful recently. She describes having chest tightness when her mother upsets her. No complaints of dizziness, lightheadedness, syncope, palpitations, orthopnea, PND, or LE edema.   Hospital course: BP is generally soft, intermittently tachypneic,  hypoxic to 91% on RA improved with 2L O2 via [place], and afebrile. Labs notable for K 5.5>3.9, Cr 8.59>5.06, Hgb 10.4, PLT 197, HsTrop 72>94>2174>2904. COVID 19 negative. Initial EKG with sinus rhythm, rate 88 bpm with isolated TWI in V2, though repeat with TWI in V2-3 with QTc 485 - TWI in V2-3 have been seen on previous EKGs. Echo is pending read. She was dialyzed 05/13/20 and continued on her home medications. Cardiology asked to evaluate for elevated troponins.    Past Medical History:  Diagnosis Date  . Adenomatous polyp of colon 10/2010, 2006, 2015  . Anemia in CKD (chronic kidney disease) 11/07/2012   s/p blood transfusion.   . Arthritis   . CAD (coronary artery disease)    "something like that"  . CHF (congestive heart failure) ([place])   . Constipation   . Depression with anxiety   . Diverticula, colon   . 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  Had some early rejection then stable function for years, then had slow decline of function and went back on hemodialysis in 2012.  Gets HD TTS schedule at [facility] on [facility] still using L forearm AVF.     Marland Kitchen GERD (gastroesophageal reflux disease)   .  GI bleed 2017   felt to be ischemic colitis, last colo 2015  . Headache   . Hyperlipidemia   . Hypertension   . Neurologic gait dysfunction   . Neuromuscular disorder (HCC)    neuropathy hand and legs  . Osteoporosis   . Pneumonia   . 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 Pulmonary edema 12/2019  . Stroke [facility])  11/2015   TIA  . Weight loss, unintentional     Past Surgical History:  Procedure Laterality Date  . AV FISTULA PLACEMENT     for dialysis  . AV FISTULA PLACEMENT Left 11/22/2015   Procedure: ARTERIOVENOUS (AV) FISTULA CREATION-LEFT BRACHIOCEPHALIC;  Surgeon: Serafina Mitchell, MD;  Location: [place];  Service: Vascular;  Laterality: Left;  . AV FISTULA PLACEMENT Right 03/15/2020   Procedure: INSERTION OF ARTERIOVENOUS (AV) GORE-TEX GRAFT ARM ( BRACHIAL AXILLARY );  Surgeon: Katha Cabal, MD;  Location: [facility];  Service: Vascular;  Laterality: Right;  . BACK SURGERY    . CERVICAL FUSION    . CHOLECYSTECTOMY  12/02/2012   Procedure: LAPAROSCOPIC CHOLECYSTECTOMY WITH INTRAOPERATIVE CHOLANGIOGRAM;  Surgeon: Edward Jolly, MD;  Location: [place];  Service: General;  Laterality: N/A;  . EYE SURGERY Bilateral    cataract surgery  . EYE SURGERY Left 2019   laser  . HEMATOMA EVACUATION Left 12/24/2016   Procedure: EVACUATION HEMATOMA LEFT UPPER ARM;  Surgeon: Waynetta Sandy, MD;  Location: [place];  Service: Vascular;  Laterality: Left;  . I & D EXTREMITY Left 12/31/2016   Procedure: IRRIGATION AND DEBRIDEMENT EXTREMITY;  Surgeon: Angelia Mould, MD;  Location: [place];  Service: Vascular;  Laterality: Left;  . INSERTION OF DIALYSIS CATHETER Right 12/24/2016   Procedure: INSERTION OF DIALYSIS CATHETER;  Surgeon: Waynetta Sandy, MD;  Location: [place];  Service: Vascular;  Laterality: Right;  . INSERTION OF DIALYSIS CATHETER Right 02/04/2017   Procedure: INSERTION OF DIALYSIS CATHETER;  Surgeon: Waynetta Sandy, MD;  Location: [place];  Service: Vascular;  Laterality: Right;  . KIDNEY TRANSPLANT  1996  . PERIPHERAL VASCULAR CATHETERIZATION Left 10/23/2016   Procedure: Fistulagram;  Surgeon: Elam Dutch, MD;  Location: [place] CV LAB;  Service: Cardiovascular;  Laterality: Left;  . PERIPHERAL VASCULAR THROMBECTOMY Right 04/16/2020   Procedure: PERIPHERAL  VASCULAR THROMBECTOMY;  Surgeon: Katha Cabal, MD;  Location: [place] CV LAB;  Service: Cardiovascular;  Laterality: Right;  . RESECTION OF ARTERIOVENOUS FISTULA ANEURYSM Left 11/22/2015   Procedure: RESECTION OF LEFT RADIOCEPHALIC FISTULA ANEURYSM ;  Surgeon: Serafina Mitchell, MD;  Location: [place];  Service: Vascular;  Laterality: Left;  . REVISON OF ARTERIOVENOUS FISTULA Left 12/22/2016   Procedure: REVISON OF LEFT ARTERIOVENOUS FISTULA;  Surgeon: Waynetta Sandy, MD;  Location: [place];  Service: Vascular;  Laterality: Left;  . REVISON OF ARTERIOVENOUS FISTULA Left 02/04/2017   Procedure: REVISON OF LEFT UPPER ARM ARTERIOVENOUS FISTULA;  Surgeon: Waynetta Sandy, MD;  Location: [place];  Service: Vascular;  Laterality: Left;  . UPPER EXTREMITY ANGIOGRAPHY Bilateral 09/19/2019   Procedure: UPPER EXTREMITY ANGIOGRAPHY;  Surgeon: Katha Cabal, MD;  Location: [place] CV LAB;  Service: Cardiovascular;  Laterality: Bilateral;     Home Medications:  Prior to Admission medications   Medication Sig Start Date End Date Taking? Authorizing Provider  acetaminophen (TYLENOL) 500 MG tablet Take 1 tablet (500 mg total) by mouth every 6 (six) hours as needed. Patient taking  differently: Take 500 mg by mouth every 6 (six) hours as needed for mild pain.  09/05/18   Law, Bea Graff, PA-C  allopurinol (ZYLOPRIM) 300 MG tablet Take 150 mg by mouth daily.     [provider]  ALPRAZolam Duanne Moron) 0.25 MG tablet Take 0.25 mg by mouth daily as needed for anxiety.     [provider]  atorvastatin (LIPITOR) 40 MG tablet Take 1 tablet (40 mg total) by mouth daily. 04/02/20 05/02/20  Amin, Ankit Chirag, MD  AURYXIA 1 GM 210 MG(Fe) tablet Take 420 mg by mouth 3 (three) times daily with meals.     [provider]  B Complex-C-Folic Acid (DIALYVITE 416) 0.8 MG WAFR Take 1 tablet by mouth daily.  10/25/18   [provider]  carvedilol (COREG) 6.25 MG tablet  Take 1 tablet (6.25 mg total) by mouth 2 (two) times daily with a meal. 04/02/20 05/02/20  Amin, Jeanella Flattery, MD  cinacalcet (SENSIPAR) 30 MG tablet Take 30 mg by mouth daily. 01/04/20   [provider]  HYDROcodone-acetaminophen (NORCO) 5-325 MG tablet Take 1-2 tablets by mouth every 6 (six) hours as needed for moderate pain or severe pain. 03/15/20   Schnier, Dolores Lory, MD  labetalol (NORMODYNE) 200 MG tablet Take 200 mg by mouth daily. 04/14/20   [provider]  lactulose (CHRONULAC) 10 GM/15ML solution Take 20 g by mouth daily as needed for mild constipation.    [provider]  losartan (COZAAR) 50 MG tablet Take 50 mg by mouth daily. 04/23/20   [provider]  ondansetron (ZOFRAN) 8 MG tablet Take 8 mg by mouth 2 (two) times daily as needed for nausea or vomiting.     [provider]  zolpidem (AMBIEN) 10 MG tablet Take 10 mg by mouth at bedtime as needed for sleep.     [provider]    Inpatient Medications: Scheduled Meds: . allopurinol  150 mg Oral Daily  . ALPRAZolam  0.25 mg Oral QODAY  . atorvastatin  40 mg Oral Daily  . carvedilol  6.25 mg Oral BID WC  . Chlorhexidine Gluconate Cloth  6 each Topical Q0600  . cinacalcet  30 mg Oral Q breakfast  . enoxaparin (LOVENOX) injection  30 mg Subcutaneous Q24H  . ferric citrate  420 mg Oral TID WC  . heparin  3,000 Units Dialysis Once in dialysis  . multivitamin  1 tablet Oral Daily   Continuous Infusions: . sodium chloride    . sodium chloride     PRN Meds: sodium chloride, sodium chloride, acetaminophen **OR** acetaminophen, alteplase, heparin, lactulose, lidocaine (PF), lidocaine-prilocaine, pentafluoroprop-tetrafluoroeth, polyethylene glycol, zolpidem  Allergies:    Allergies  Allergen Reactions  . Sulfa Antibiotics Other (See Comments)    Both parents allergic-so will not take  . Adhesive [Tape] Itching    Social History:   Social History   Socioeconomic History  .  Marital status: Widowed    Spouse name: Not on file  . Number of children: 2  . Years of education: Not on file  . Highest education level: Not on file  Occupational History  . Not on file  Tobacco Use  . Smoking status: Former Smoker    Types: Cigarettes    Quit date: 12/31/1991    Years since quitting: 28.3  . Smokeless tobacco: Never Used  Substance and Sexual Activity  . Alcohol use: No    Alcohol/week: 0.0 standard drinks  . Drug use: No  . Sexual activity:  Not Currently  Other Topics Concern  . Not on file  Social History Narrative   Living with her mother    Right handed   Caffeine: only decaf clears ("no brown sodas" or coffee d/t kidney disease)   Low potassium foods d/t kidney disease   Social Determinants of Health   Financial Resource Strain:   . Difficulty of Paying Living Expenses:   Food Insecurity:   . Worried About Charity fundraiser in the Last Year:   . Arboriculturist in the Last Year:   Transportation Needs:   . Film/video editor (Medical):   Marland Kitchen Lack of Transportation (Non-Medical):   Physical Activity:   . Days of Exercise per Week:   . Minutes of Exercise per Session:   Stress:   . Feeling of Stress :   Social Connections:   . Frequency of Communication with Friends and Family:   . Frequency of Social Gatherings with Friends and Family:   . Attends Religious Services:   . Active Member of Clubs or Organizations:   . Attends Archivist Meetings:   Marland Kitchen Marital Status:   Intimate Partner Violence:   . Fear of Current or Ex-Partner:   . Emotionally Abused:   Marland Kitchen Physically Abused:   . Sexually Abused:     Family History:    Family History  Problem Relation Age of Onset  . Colon cancer Brother   . Cancer Brother   . Coronary artery disease Mother 10  . Hyperlipidemia Mother   . Hypertension Mother   . Stroke Maternal Aunt   . Esophageal cancer Neg Hx   . Stomach cancer Neg Hx   . Rectal cancer Neg Hx      ROS:  Please  see the history of present illness.  ROS  All other ROS reviewed and negative.     Physical Exam/Data:   Vitals:   05/13/20 2230 05/13/20 2359 05/14/20 0500 05/14/20 0849  BP: 113/61 (!) 124/42 (!) 124/51 (!) 102/39  Pulse: 82 88 99 89  Resp: 18 18 18 14   Temp: 98.2 F (36.8 C) 99.2 F (37.3 C) 99 F (37.2 C) (!) 97.5 F (36.4 C)  TempSrc: Oral Oral Oral Oral  SpO2: 100% 100% 98% 95%  Weight: 52 kg 49.1 kg      Intake/Output Summary (Last 24 hours) at 05/14/2020 1016 Last data filed at 05/14/2020 0500 Gross per 24 hour  Intake 0 ml  Output 2100 ml  Net -2100 ml   Filed Weights   05/13/20 1837 05/13/20 2230 05/13/20 2359  Weight: 54.2 kg 52 kg 49.1 kg   Body mass index is 19.8 kg/m.  General:  Thin chronically ill appearing female sitting in bed in NAD HEENT: sclera anicteric  Lymph: no adenopathy Neck: no JVD Endocrine:  No thryomegaly Vascular: No carotid bruits; distal pulses 2+ bilaterally Cardiac:  normal S1, S2; RRR; +murmurs, no rubs or gallops appreciated Lungs:  clear to auscultation bilaterally, no wheezing, rhonchi or rales  Abd: NABS, soft, nontender, no hepatomegaly Ext: no edema Musculoskeletal:  No deformities, BUE and BLE strength normal and equal Skin: warm and dry  Neuro:  CNs 2-12 intact, no focal abnormalities noted Psych:  Normal affect   EKG:  The EKG was personally reviewed and demonstrates:   Initial EKG with sinus rhythm, rate 88 bpm with isolated TWI in V2, though repeat with TWI in V2-3 with QTc 485 - TWI in V2-3 have been seen on previous  EKGs. Telemetry:  Telemetry was personally reviewed and demonstrates:  Sinus rhythm with rate in the 80s-90s  Relevant CV Studies: ECHO: limited 03/15/2020 1. Left ventricular ejection fraction, by estimation, is 50 to 55%. The  left ventricle has low normal function. The left ventricle demonstrates  regional wall motion abnormalities (distal anteroseptal and apical region,  concerning for conduction  abnormality). There is moderate left ventricular hypertrophy. Left ventricular diastolic parameters are consistent with Grade I diastolic dysfunction (impaired relaxation). (No RWMA on 01/01/2020 echo) 2. Right ventricular systolic function is normal. The right ventricular  size is normal. There is severely elevated pulmonary artery systolic  pressure. The estimated right ventricular systolic pressure is 16.1 mmHg.  3. Left atrial size was moderately dilated.  4. Right atrial size was mildly dilated.  5. Tricuspid valve regurgitation is moderate to severe.  6. The inferior vena cava is dilated in size with <50% respiratory  variability, suggesting right atrial pressure of 15 mmHg.   MYOVIEW: 02/20/2020  Nuclear stress EF: 69%.  There was no ST segment deviation noted during stress.  The study is normal.  This is a low risk study.  The left ventricular ejection fraction is hyperdynamic (>65%).  Laboratory Data:  Chemistry Recent Labs  Lab 05/13/20 1227 05/14/20 0120  NA 137 138  K 5.5* 3.9  CL 95* 98  CO2 22 26  GLUCOSE 117* 170*  BUN 72* 30*  CREATININE 8.59* 5.06*  CALCIUM 9.1 8.2*  GFRNONAA 4* 8*  GFRAA 5* 9*  ANIONGAP 20* 14    No results for input(s): PROT, ALBUMIN, AST, ALT, ALKPHOS, BILITOT in the last 168 hours. Hematology Recent Labs  Lab 05/13/20 1227 05/14/20 0120  WBC 10.5 6.1  RBC 3.30* 3.37*  HGB 10.1* 10.4*  HCT 31.3* 32.5*  MCV 94.8 96.4  MCH 30.6 30.9  MCHC 32.3 32.0  RDW 16.1* 16.2*  PLT 220 197   Cardiac EnzymesNo results for input(s): TROPONINI in the last 168 hours. No results for input(s): TROPIPOC in the last 168 hours.  BNPNo results for input(s): BNP, PROBNP in the last 168 hours.  DDimer No results for input(s): DDIMER in the last 168 hours.  Radiology/Studies:  DG Chest 2 View  Result Date: 05/13/2020 CLINICAL DATA:  Chest pain with vomiting and lethargy. Renal failure EXAM: CHEST - 2 VIEW COMPARISON:  April 01, 2020  FINDINGS: Lungs are somewhat hyperexpanded. There is cardiomegaly with pulmonary venous hypertension. There is interstitial edema. No airspace consolidation. There is aortic atherosclerosis. No adenopathy. No bone lesions. IMPRESSION: Cardiomegaly with pulmonary vascular congestion. Interstitial edema. Appearance is consistent with a degree of congestive heart failure. No consolidation. Lungs are mildly hyperexpanded. Aortic Atherosclerosis (ICD10-I70.0). Electronically Signed   By: Lowella Grip III M.D.   On: 05/13/2020 13:16    Assessment and Plan:   1. Chest pain and elevated troponins in patient with extensive aortic and atherosclerotic calcifications on CT chest: patient presented with chest pain. EKG with TWI in V2-3 which have been seen on previous EKGs. HsTrop [phone]. 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 Risk factors for CAD include HTN, HLD, and aortic/coronary artery calcifications on CT chest. - She would benefit from an ischemic evaluation with a coronary CTA or LHC given recurrent chest pain and elevated troponins. She was not interested in invasive testing at the  time of my evaluation. Will follow-up with [name] to determine if patient is agreeable to additional testing - Will follow-up echo read - Continue carvedilol and atorvastatin - Will start aspirin 81mg  daily  2. Chronic diastolic CHF: often her chest pain is resolved with dialysis suggesting CHF component. Previous echo 03/2020 suggested RWMA felt to be 2/2 stress induced cardiomyopathy. Echo read is pending this admission.  - Continue volume management per nephrology.  - Continue carvedilol  3. HTN: BP generally stable this admission - Continue carvedilol  4. HLD: LDL  121 03/2020 - Continue  atorvastatin  5. ESRD on HD: Nephrology following. She went for dialysis yesterday and symptoms improved.    For questions or updates, please contact [place] Please consult [email or web address] for contact info under Cardiology/STEMI.   Signed, Abigail Butts, PA-C  05/14/2020 10:16 AM [phone]  I have personally seen and examined this patient. I agree with the assessment and plan as outlined above.  Admitted with chest pain that resolved with HD. No exertional chest pain. Normal stress test March 2021. Normal echo April 2021. I reviewed the images today and wall motion is normal on today's echo. Troponin up to 2900. EKG personally reviewed by me and shows sinus with no changes to suggest ischemia.   My exam:  General: Well developed, well nourished, NAD  HEENT: OP clear, mucus membranes moist  SKIN: warm, dry. No rashes. Neuro: No focal deficits  Musculoskeletal: Muscle strength 5/5 all ext  Psychiatric: Mood and affect normal  Neck: No JVD, no carotid bruits, no thyromegaly, no lymphadenopathy.  Lungs:Clear bilaterally, no wheezes, rhonci, crackles Cardiovascular: Regular rate and rhythm. No murmurs, gallops or rubs. Abdomen:Soft. Bowel sounds present. Non-tender.  Extremities: No lower extremity edema. Pulses are 2 + in the bilateral DP/PT.  Plan: Chest pain resolved with HD. Elevated troponin. We have discussed excluding CAD with a cardiac cath or coronary CTA but she refuses to consider at this time. She can be discharged home today. We will arrange f/u in our office and can discuss a coronary CTA at that time.   [name] 05/14/2020 12:32 PM

## 2020-05-16 DIAGNOSIS — I132 Hypertensive heart and chronic kidney disease with heart failure and with stage 5 chronic kidney disease, or end stage renal disease: Secondary | ICD-10-CM | POA: Diagnosis not present

## 2020-05-16 DIAGNOSIS — Z992 Dependence on renal dialysis: Secondary | ICD-10-CM | POA: Diagnosis not present

## 2020-05-16 DIAGNOSIS — I5033 Acute on chronic diastolic (congestive) heart failure: Secondary | ICD-10-CM | POA: Diagnosis not present

## 2020-05-16 DIAGNOSIS — T8612 Kidney transplant failure: Secondary | ICD-10-CM | POA: Diagnosis not present

## 2020-05-16 DIAGNOSIS — Z48812 Encounter for surgical aftercare following surgery on the circulatory system: Secondary | ICD-10-CM | POA: Diagnosis not present

## 2020-05-16 DIAGNOSIS — N186 End stage renal disease: Secondary | ICD-10-CM | POA: Diagnosis not present

## 2020-05-18 DIAGNOSIS — Z48812 Encounter for surgical aftercare following surgery on the circulatory system: Secondary | ICD-10-CM | POA: Diagnosis not present

## 2020-05-19 IMAGING — DX DG CHEST 1V PORT
1 series · 1 of 1 positions shown · non-contrast
Comparison: 12/13/2018

CLINICAL DATA: Weakness, shortness of breath

EXAM:
PORTABLE CHEST 1 VIEW

[chest]
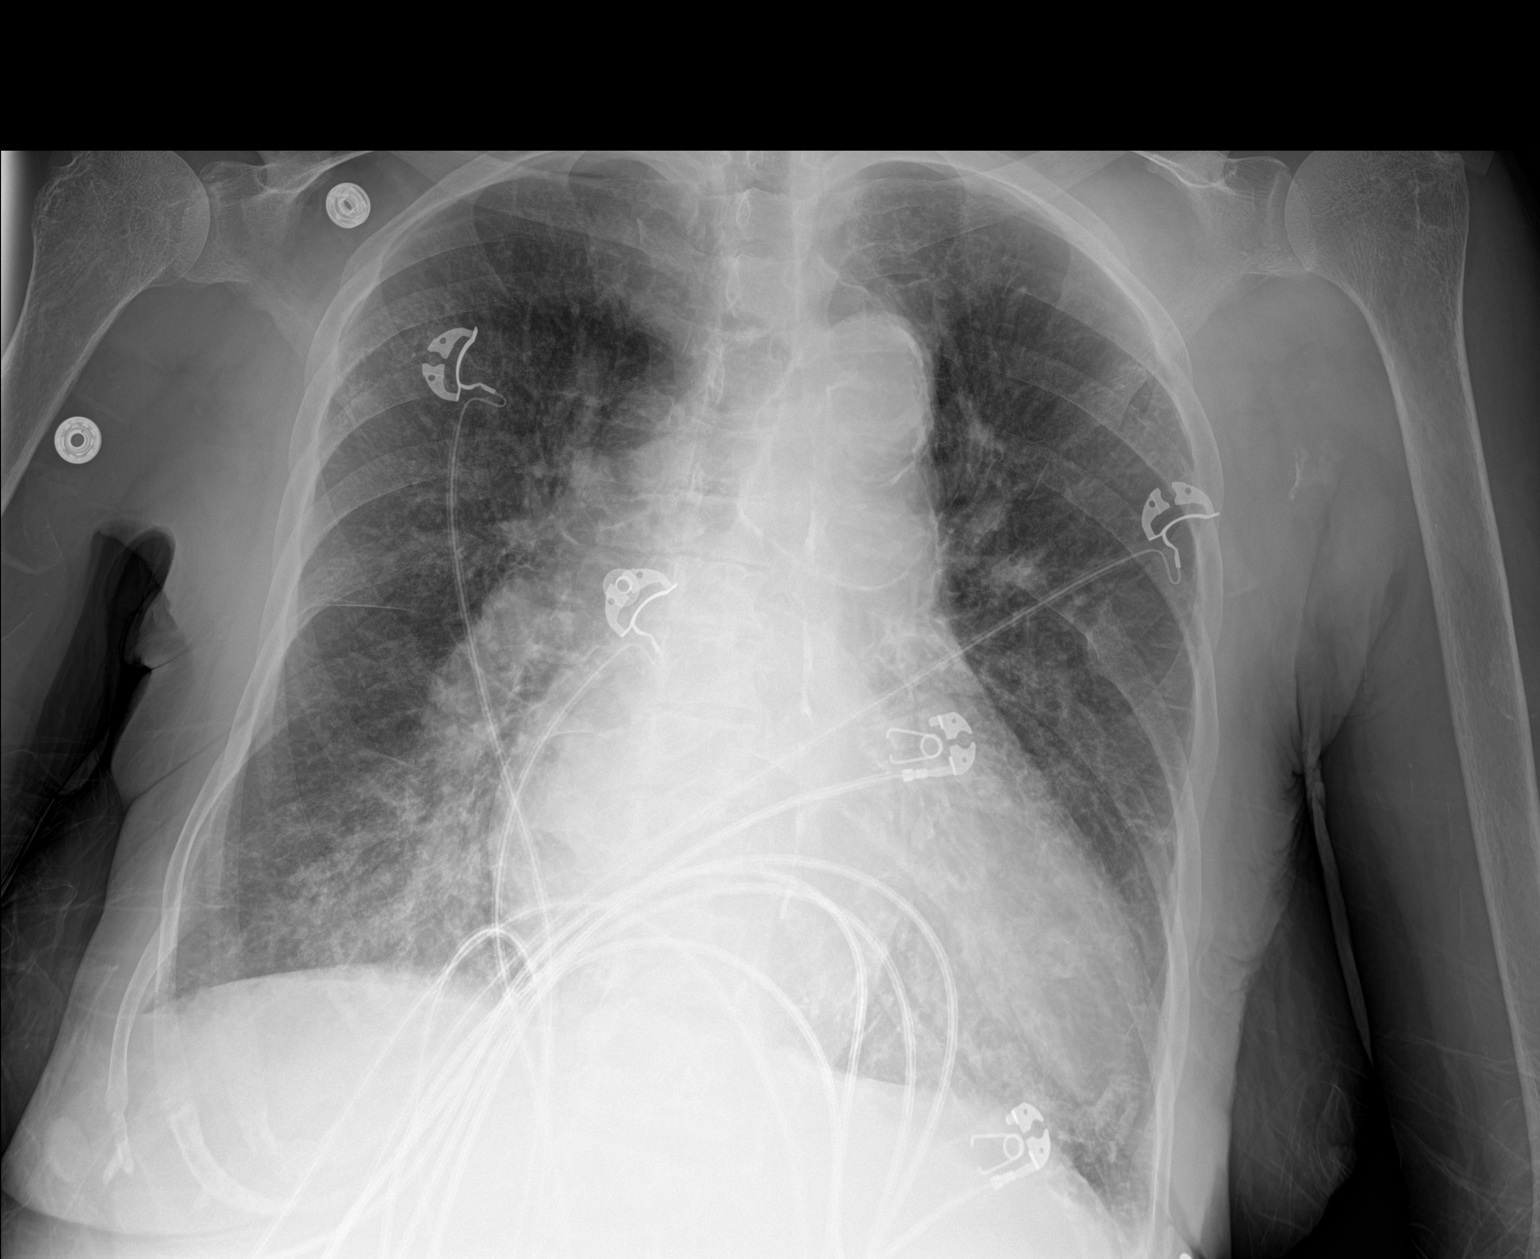

[1 of 1 positions shown; findings below may reference images not displayed]

FINDINGS: Cardiomegaly with vascular congestion. Interstitial prominence noted
which could reflect early interstitial edema. Somewhat more focal
opacity at the right lung base. No effusions. No acute bony
abnormality.
IMPRESSION: Cardiomegaly, vascular congestion. Question early interstitial
edema.

Focal opacity at the right lung base could reflect edema or
infiltrates.

## 2020-05-20 IMAGING — CR DG CHEST 2V
2 series · 2 of 2 positions shown · non-contrast
Comparison: 10/11/2019

CLINICAL DATA: Shortness of breath.

EXAM:
CHEST - 2 VIEW

[chest pa]
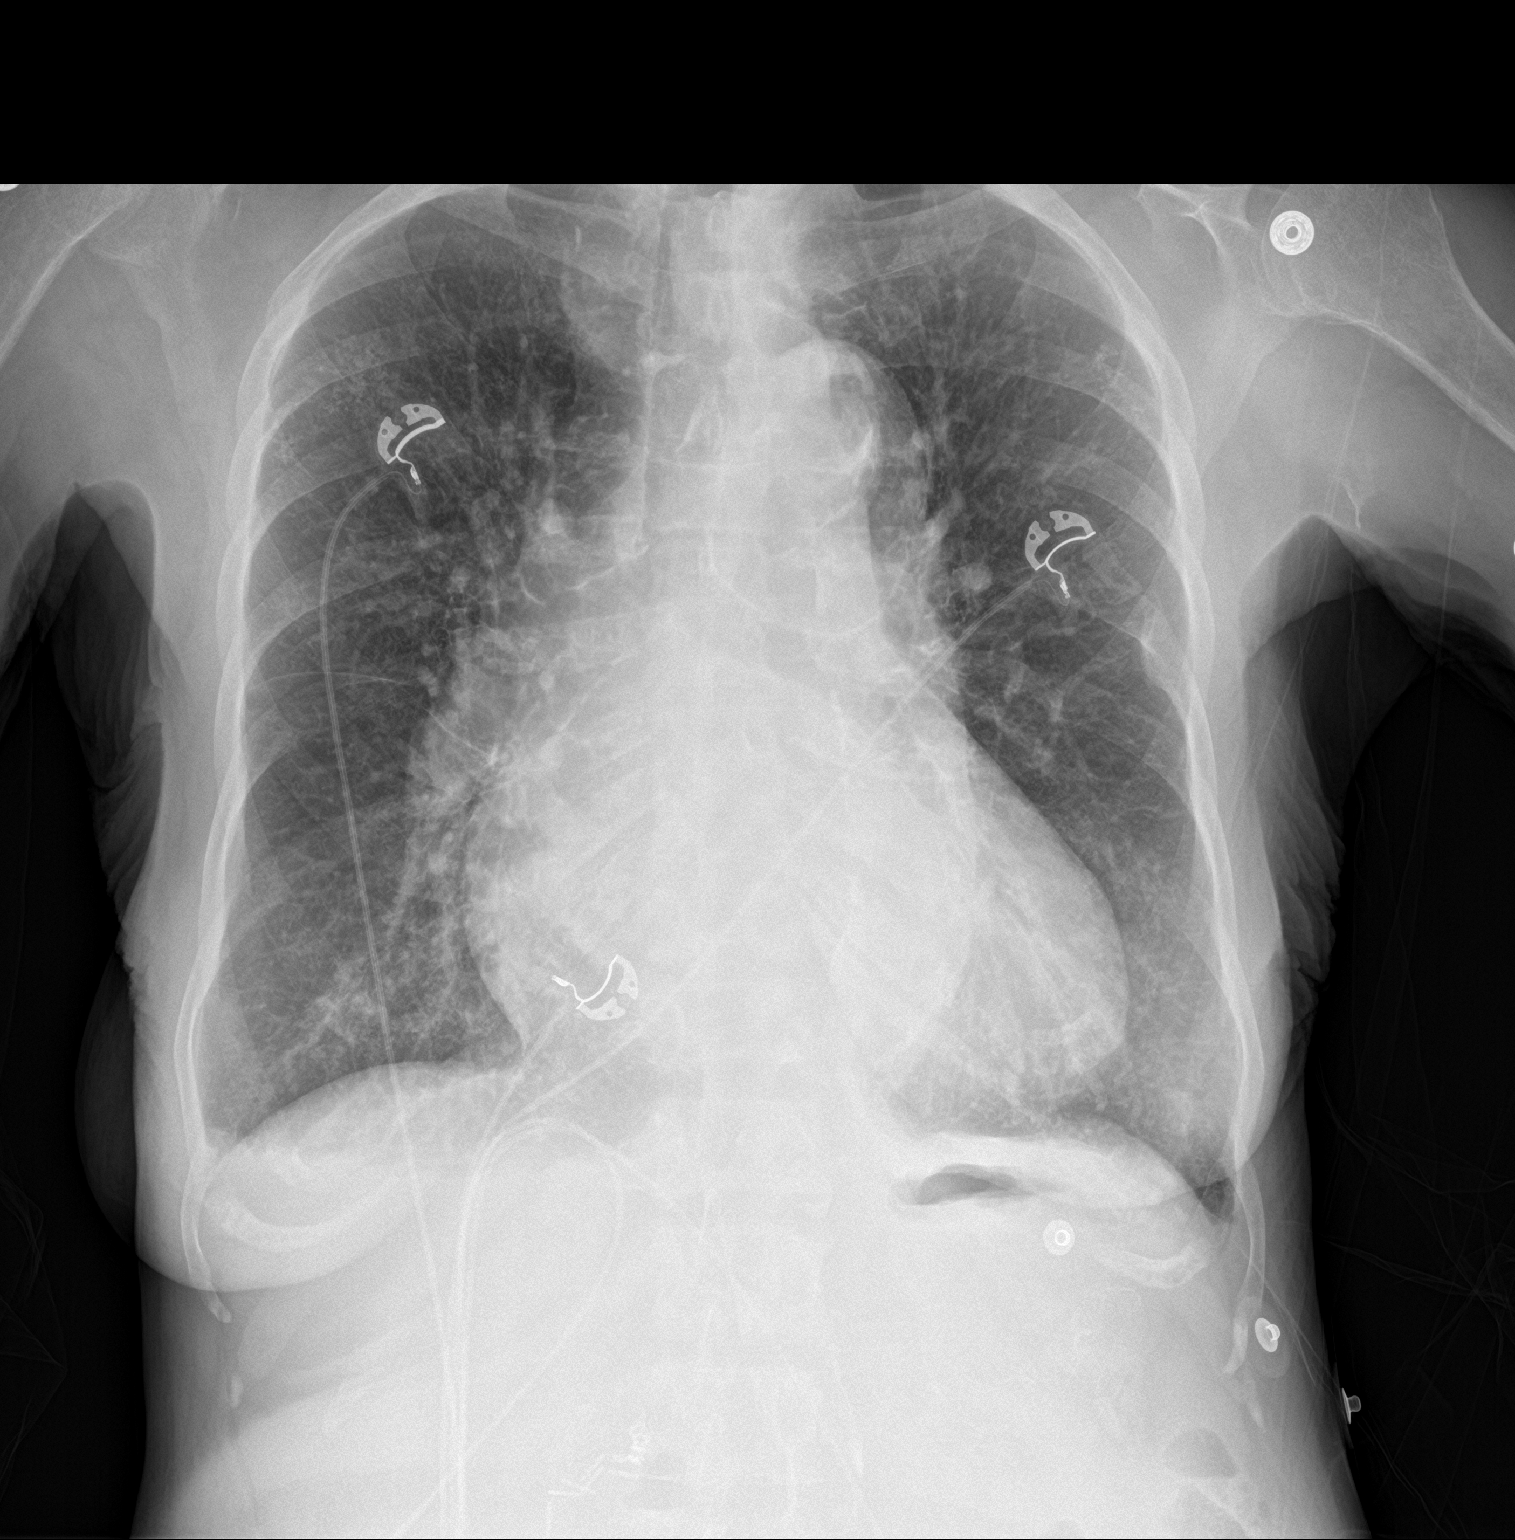

[chest lat]
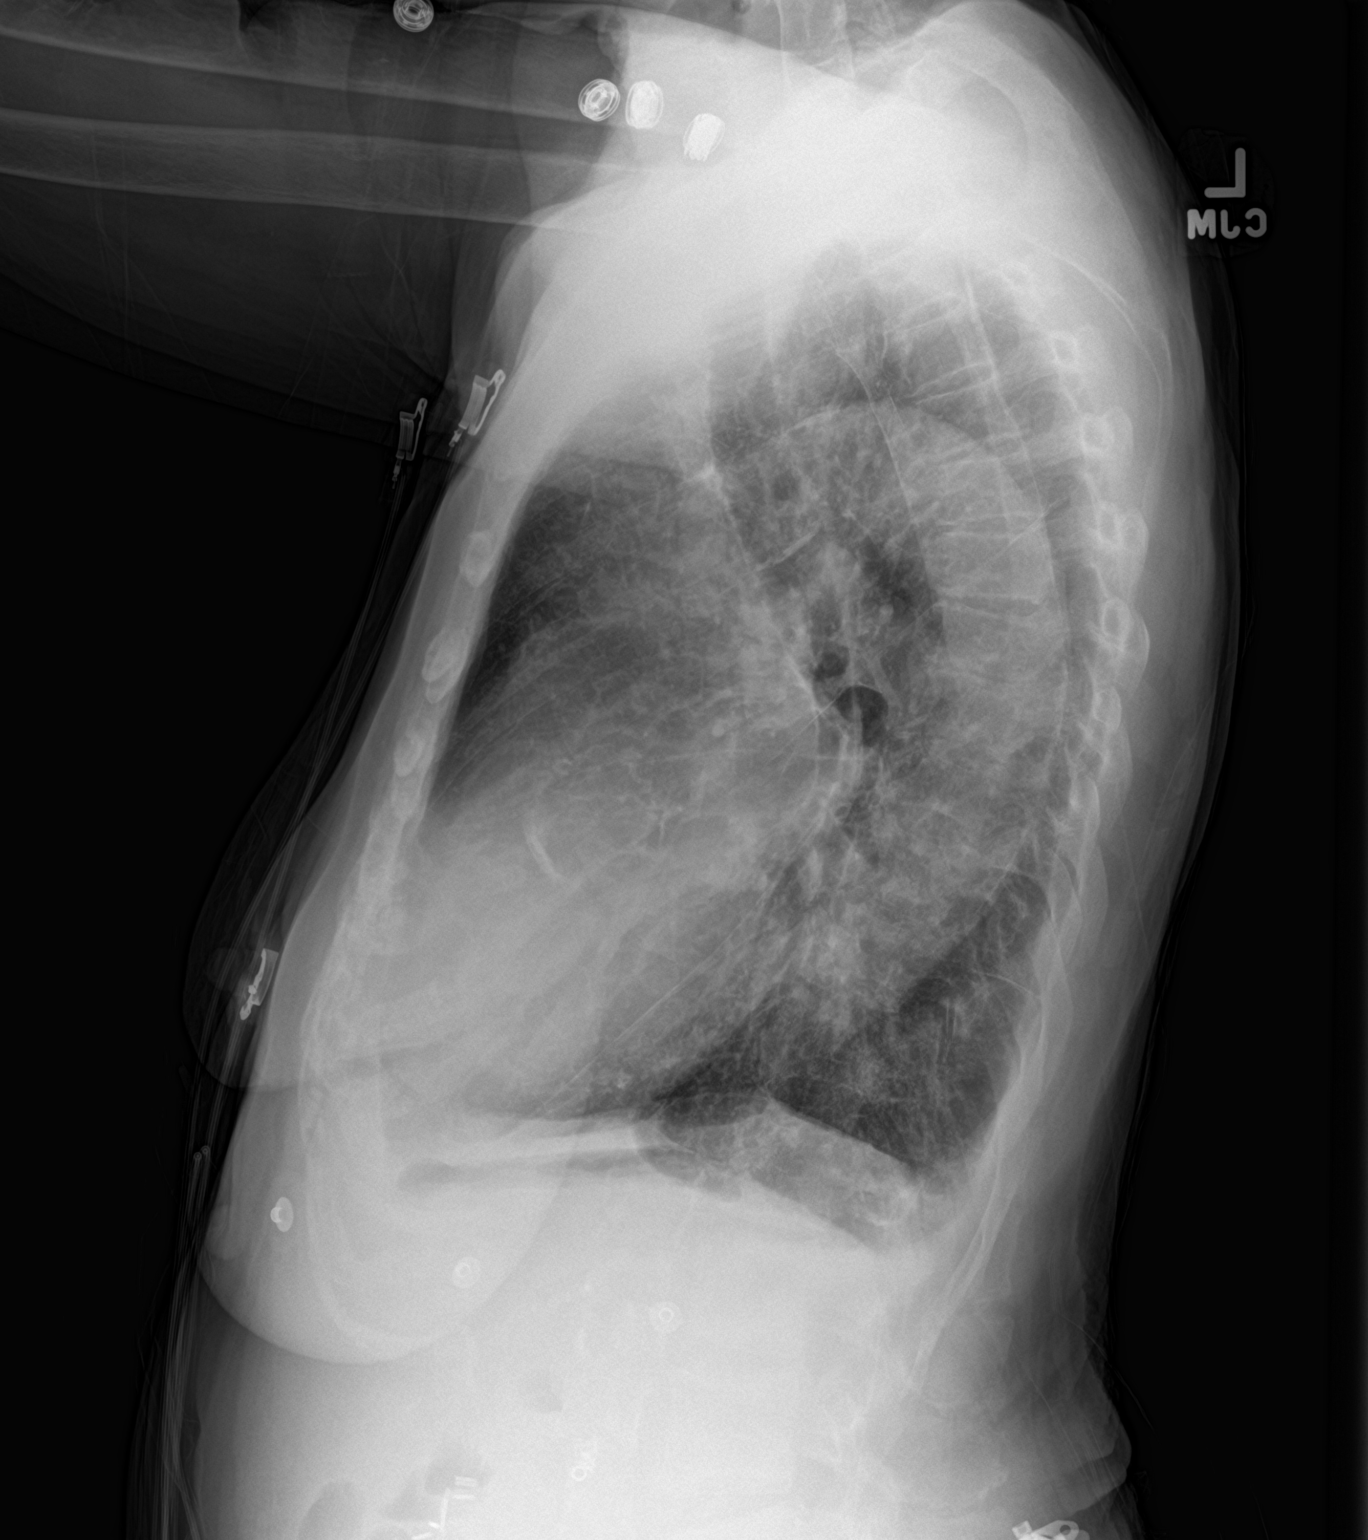

[2 of 2 positions shown; findings below may reference images not displayed]

FINDINGS: The heart is enlarged but stable. Stable tortuosity, ectasia and
calcification of the thoracic aorta. The lung bases appears slightly
better aerated with resolving atelectasis. Mild central vascular
congestion but no overt pulmonary edema. The Kerley B lines seen on
the prior chest film have largely resolved. No pleural effusions.
IMPRESSION: Stable cardiac enlargement.

Resolving edema and atelectasis.

## 2020-06-06 ENCOUNTER — Telehealth: Payer: Self-pay | Admitting: Cardiology

## 2020-06-06 NOTE — Telephone Encounter (Signed)
Will send to primary card for further review. I do not see where the pt has [name].

## 2020-06-06 NOTE — Telephone Encounter (Signed)
Patient received a note from the social worker at the dialysis center for her to call to set up a cath appt w/ [name].

## 2020-06-13 ENCOUNTER — Telehealth: Payer: Self-pay | Admitting: Cardiology

## 2020-06-13 DIAGNOSIS — T8612 Kidney transplant failure: Secondary | ICD-10-CM | POA: Diagnosis not present

## 2020-06-13 DIAGNOSIS — Z992 Dependence on renal dialysis: Secondary | ICD-10-CM | POA: Diagnosis not present

## 2020-06-13 DIAGNOSIS — N186 End stage renal disease: Secondary | ICD-10-CM | POA: Diagnosis not present

## 2020-06-13 NOTE — Telephone Encounter (Signed)
Routing to Dr. [name] for ok to switch to a [facility] doctor.

## 2020-06-13 NOTE — Telephone Encounter (Signed)
-----   Message from Annia Belt, RN sent at 06/13/2020 11:14 AM EDT ----- Regarding: FW: follow up Patient needs appt with Dr. [name].  Thanks ya'll! ----- Message ----- From: Rodman Key, RN Sent: 06/13/2020  10:39 AM EDT To: [facility] Triage, [facility] Scheduling Subject: follow up                                      Hey! Can you help get this patient scheduled? She sees Dr. [name].  Was admitted to [facility] and recommendations were for coronary cta or left heart cath but the patient declined these recommendations while admitted.     According phone message 06/06/20, and a fax from Karilyn Cota, [place] at [facility], the patient is ready to discuss follow up.   I reviewed with [name].  The patient will need a follow up appointment with Dr. [name] or an APP to discuss/arrange.    Thank you! Caren Hazy, RN

## 2020-06-13 NOTE — Telephone Encounter (Signed)
Refer to previous phone note - patient requesting [facility] office

## 2020-06-13 NOTE — Telephone Encounter (Signed)
Attempted to schedule no ans no vm  

## 2020-06-13 NOTE — Telephone Encounter (Signed)
She was seen by him while admitted to [facility].  Her primary cardiologist is Dr. [name] and she is seen in [place].    Pt declined cardiology recommendations for further workup - coronary CTA or LHC - while she was admitted.  I have received a message from her dialysis center stating "patientis ready to have heart cath"  [name], [name] at [facility]).  Reviewed with [name].  The patient will need follow up with her primary cardiologist or an APP to discuss. Message sent to scheduling/[place] triage to assist.

## 2020-06-13 NOTE — Telephone Encounter (Signed)
Patient calling back to state she only wants to go to [place] and checking to see if this has been approved

## 2020-06-13 NOTE — Telephone Encounter (Signed)
Called and spoke to patient. She states that her phone cut off earlier and wanted to make sure we knew that she prefers to be seen by a cardiologist in [place] as it is closer to her home. She states that she would like to discuss getting a cath set up. She denies having any chest pain, SOB, or any other Sx and states that she feels fine since starting her new medicine. Made her aware that a message has been sent to Dr. [name] for a provider switch approval and she will be contacted once we hear back. She will contact us if her Sx change.

## 2020-06-13 NOTE — Telephone Encounter (Signed)
Called and spoke with patient  Patient lives in [place] and does not wish to come to [place] for appointments Sent message to make [name] aware

## 2020-06-14 DIAGNOSIS — N186 End stage renal disease: Secondary | ICD-10-CM | POA: Diagnosis not present

## 2020-06-14 DIAGNOSIS — Z992 Dependence on renal dialysis: Secondary | ICD-10-CM | POA: Diagnosis not present

## 2020-06-14 DIAGNOSIS — D631 Anemia in chronic kidney disease: Secondary | ICD-10-CM | POA: Diagnosis not present

## 2020-06-14 DIAGNOSIS — N2581 Secondary hyperparathyroidism of renal origin: Secondary | ICD-10-CM | POA: Diagnosis not present

## 2020-06-14 NOTE — Telephone Encounter (Signed)
Left message for patient to call back  

## 2020-06-17 DIAGNOSIS — N2581 Secondary hyperparathyroidism of renal origin: Secondary | ICD-10-CM | POA: Diagnosis not present

## 2020-06-17 DIAGNOSIS — Z992 Dependence on renal dialysis: Secondary | ICD-10-CM | POA: Diagnosis not present

## 2020-06-17 DIAGNOSIS — D631 Anemia in chronic kidney disease: Secondary | ICD-10-CM | POA: Diagnosis not present

## 2020-06-17 DIAGNOSIS — N186 End stage renal disease: Secondary | ICD-10-CM | POA: Diagnosis not present

## 2020-06-19 DIAGNOSIS — N2581 Secondary hyperparathyroidism of renal origin: Secondary | ICD-10-CM | POA: Diagnosis not present

## 2020-06-19 DIAGNOSIS — D631 Anemia in chronic kidney disease: Secondary | ICD-10-CM | POA: Diagnosis not present

## 2020-06-19 DIAGNOSIS — N186 End stage renal disease: Secondary | ICD-10-CM | POA: Diagnosis not present

## 2020-06-19 DIAGNOSIS — Z992 Dependence on renal dialysis: Secondary | ICD-10-CM | POA: Diagnosis not present

## 2020-06-19 NOTE — Telephone Encounter (Signed)
Left message for patient to call back  

## 2020-06-21 DIAGNOSIS — N2581 Secondary hyperparathyroidism of renal origin: Secondary | ICD-10-CM | POA: Diagnosis not present

## 2020-06-21 DIAGNOSIS — N186 End stage renal disease: Secondary | ICD-10-CM | POA: Diagnosis not present

## 2020-06-21 DIAGNOSIS — D631 Anemia in chronic kidney disease: Secondary | ICD-10-CM | POA: Diagnosis not present

## 2020-06-21 DIAGNOSIS — Z992 Dependence on renal dialysis: Secondary | ICD-10-CM | POA: Diagnosis not present

## 2020-06-21 NOTE — Telephone Encounter (Signed)
Left a message for patient to call back. Per Dr .[name] patient can re-establish with any [facility] provider since it is closer to her home. Please schedule. Thanks

## 2020-06-24 DIAGNOSIS — Z992 Dependence on renal dialysis: Secondary | ICD-10-CM | POA: Diagnosis not present

## 2020-06-24 DIAGNOSIS — N2581 Secondary hyperparathyroidism of renal origin: Secondary | ICD-10-CM | POA: Diagnosis not present

## 2020-06-24 DIAGNOSIS — D631 Anemia in chronic kidney disease: Secondary | ICD-10-CM | POA: Diagnosis not present

## 2020-06-24 DIAGNOSIS — N186 End stage renal disease: Secondary | ICD-10-CM | POA: Diagnosis not present

## 2020-06-25 DIAGNOSIS — Z1231 Encounter for screening mammogram for malignant neoplasm of breast: Secondary | ICD-10-CM | POA: Diagnosis not present

## 2020-06-25 NOTE — Telephone Encounter (Signed)
Called patient, no answer, left vm to call back and schedule appt.

## 2020-06-26 DIAGNOSIS — Z992 Dependence on renal dialysis: Secondary | ICD-10-CM | POA: Diagnosis not present

## 2020-06-26 DIAGNOSIS — D631 Anemia in chronic kidney disease: Secondary | ICD-10-CM | POA: Diagnosis not present

## 2020-06-26 DIAGNOSIS — N186 End stage renal disease: Secondary | ICD-10-CM | POA: Diagnosis not present

## 2020-06-26 DIAGNOSIS — N2581 Secondary hyperparathyroidism of renal origin: Secondary | ICD-10-CM | POA: Diagnosis not present

## 2020-06-28 DIAGNOSIS — N186 End stage renal disease: Secondary | ICD-10-CM | POA: Diagnosis not present

## 2020-06-28 DIAGNOSIS — D631 Anemia in chronic kidney disease: Secondary | ICD-10-CM | POA: Diagnosis not present

## 2020-06-28 DIAGNOSIS — N2581 Secondary hyperparathyroidism of renal origin: Secondary | ICD-10-CM | POA: Diagnosis not present

## 2020-06-28 DIAGNOSIS — Z992 Dependence on renal dialysis: Secondary | ICD-10-CM | POA: Diagnosis not present

## 2020-06-28 IMAGING — DX DG CHEST 1V PORT
1 series · 1 of 1 positions shown · non-contrast
Comparison: [DATE] [DATE], [DATE].  10/12/2019

CLINICAL DATA: Shortness of breath and hypoxia.

EXAM:
PORTABLE CHEST 1 VIEW

[chest ap]
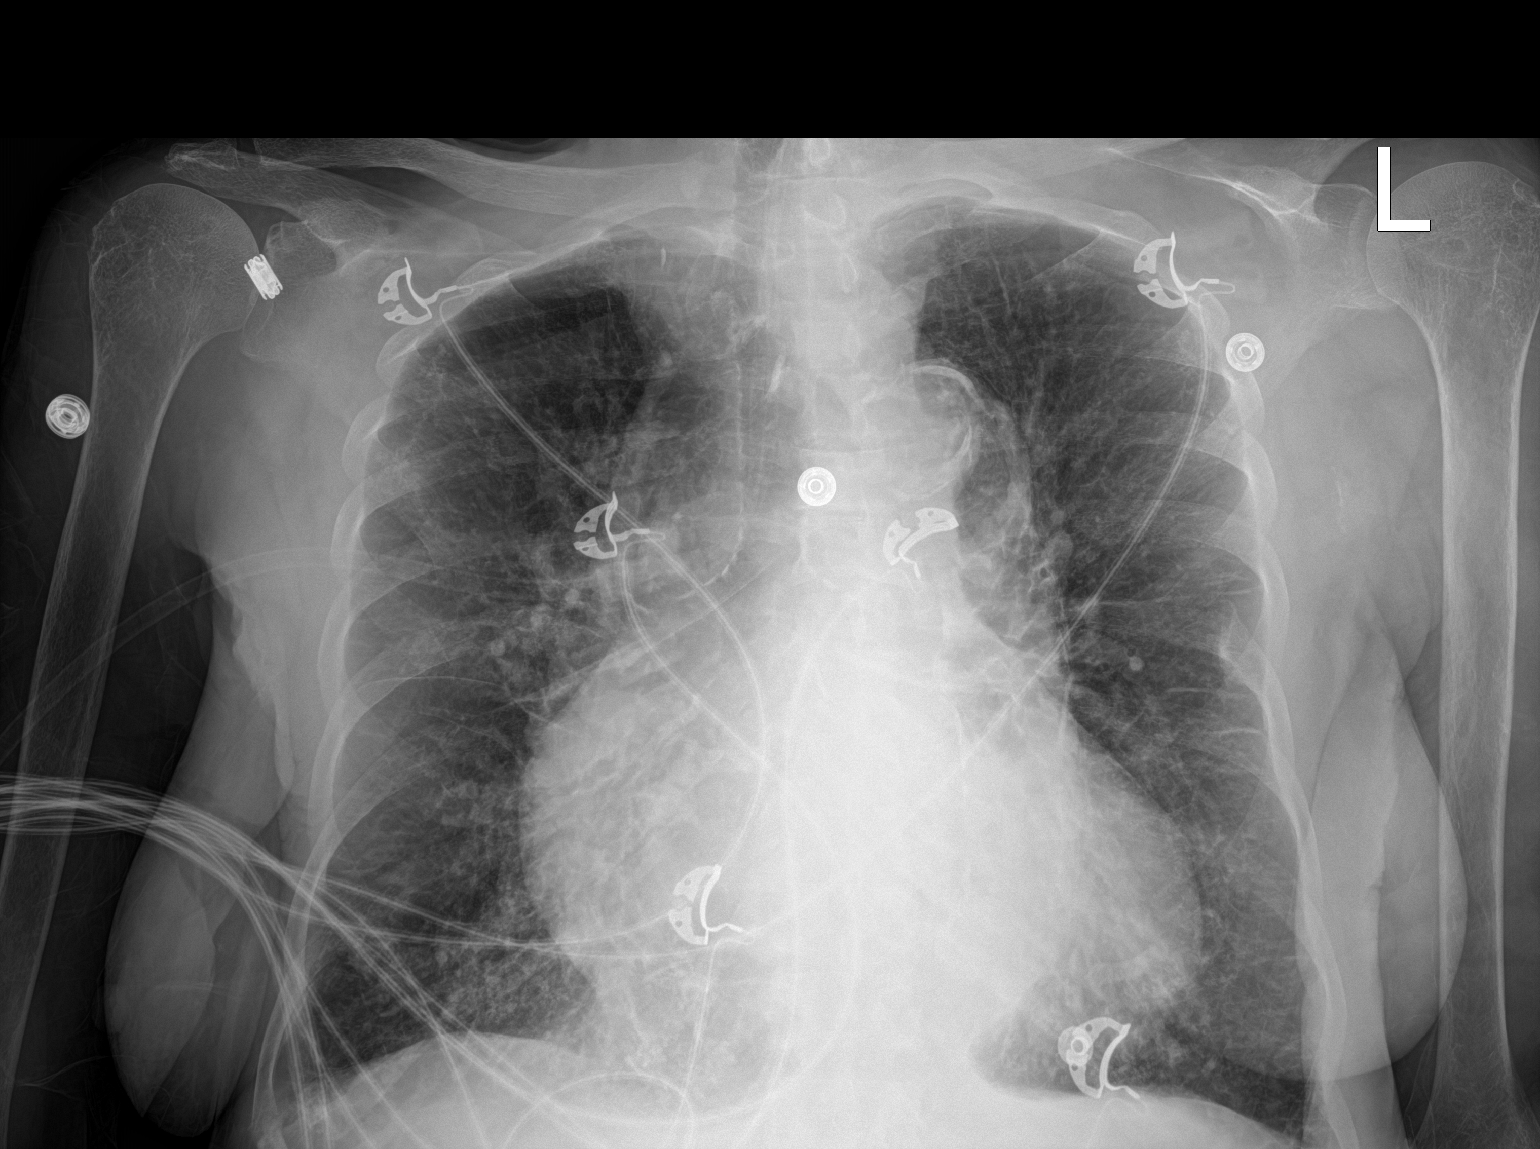

[1 of 1 positions shown; findings below may reference images not displayed]

FINDINGS: The cardiac silhouette is significantly enlarged. Aortic
calcifications are noted. There is mild vascular congestion. There
is no pneumothorax. No large pleural effusion. There is no acute
osseous abnormality.
IMPRESSION: No active disease.

## 2020-07-01 DIAGNOSIS — Z992 Dependence on renal dialysis: Secondary | ICD-10-CM | POA: Diagnosis not present

## 2020-07-01 DIAGNOSIS — D631 Anemia in chronic kidney disease: Secondary | ICD-10-CM | POA: Diagnosis not present

## 2020-07-01 DIAGNOSIS — N2581 Secondary hyperparathyroidism of renal origin: Secondary | ICD-10-CM | POA: Diagnosis not present

## 2020-07-01 DIAGNOSIS — N186 End stage renal disease: Secondary | ICD-10-CM | POA: Diagnosis not present

## 2020-07-03 DIAGNOSIS — N186 End stage renal disease: Secondary | ICD-10-CM | POA: Diagnosis not present

## 2020-07-03 DIAGNOSIS — N2581 Secondary hyperparathyroidism of renal origin: Secondary | ICD-10-CM | POA: Diagnosis not present

## 2020-07-03 DIAGNOSIS — Z992 Dependence on renal dialysis: Secondary | ICD-10-CM | POA: Diagnosis not present

## 2020-07-03 DIAGNOSIS — D631 Anemia in chronic kidney disease: Secondary | ICD-10-CM | POA: Diagnosis not present

## 2020-07-05 ENCOUNTER — Encounter (HOSPITAL_COMMUNITY): Payer: Self-pay | Admitting: Emergency Medicine

## 2020-07-05 ENCOUNTER — Emergency Department (HOSPITAL_COMMUNITY)
Admission: EM | Admit: 2020-07-05 | Discharge: 2020-07-05 | Disposition: A | Discharge status: Home / Self Care | Payer: Medicare Other | Attending: Emergency Medicine | Admitting: Emergency Medicine

## 2020-07-05 ENCOUNTER — Emergency Department (HOSPITAL_COMMUNITY): Payer: Medicare Other

## 2020-07-05 DIAGNOSIS — I2510 Atherosclerotic heart disease of native coronary artery without angina pectoris: Secondary | ICD-10-CM | POA: Insufficient documentation

## 2020-07-05 DIAGNOSIS — Z87891 Personal history of nicotine dependence: Secondary | ICD-10-CM | POA: Diagnosis not present

## 2020-07-05 DIAGNOSIS — J80 Acute respiratory distress syndrome: Secondary | ICD-10-CM | POA: Diagnosis not present

## 2020-07-05 DIAGNOSIS — N186 End stage renal disease: Secondary | ICD-10-CM | POA: Insufficient documentation

## 2020-07-05 DIAGNOSIS — Z992 Dependence on renal dialysis: Secondary | ICD-10-CM | POA: Insufficient documentation

## 2020-07-05 DIAGNOSIS — Z8673 Personal history of transient ischemic attack (TIA), and cerebral infarction without residual deficits: Secondary | ICD-10-CM | POA: Diagnosis not present

## 2020-07-05 DIAGNOSIS — R0602 Shortness of breath: Secondary | ICD-10-CM

## 2020-07-05 DIAGNOSIS — I132 Hypertensive heart and chronic kidney disease with heart failure and with stage 5 chronic kidney disease, or end stage renal disease: Secondary | ICD-10-CM | POA: Insufficient documentation

## 2020-07-05 DIAGNOSIS — I517 Cardiomegaly: Secondary | ICD-10-CM | POA: Diagnosis not present

## 2020-07-05 DIAGNOSIS — I5033 Acute on chronic diastolic (congestive) heart failure: Secondary | ICD-10-CM | POA: Diagnosis not present

## 2020-07-05 DIAGNOSIS — Z79899 Other long term (current) drug therapy: Secondary | ICD-10-CM | POA: Insufficient documentation

## 2020-07-05 DIAGNOSIS — I4891 Unspecified atrial fibrillation: Secondary | ICD-10-CM | POA: Insufficient documentation

## 2020-07-05 DIAGNOSIS — R0902 Hypoxemia: Secondary | ICD-10-CM | POA: Diagnosis not present

## 2020-07-05 DIAGNOSIS — Z7982 Long term (current) use of aspirin: Secondary | ICD-10-CM | POA: Insufficient documentation

## 2020-07-05 DIAGNOSIS — J810 Acute pulmonary edema: Secondary | ICD-10-CM

## 2020-07-05 DIAGNOSIS — N2581 Secondary hyperparathyroidism of renal origin: Secondary | ICD-10-CM | POA: Diagnosis not present

## 2020-07-05 DIAGNOSIS — R52 Pain, unspecified: Secondary | ICD-10-CM | POA: Diagnosis not present

## 2020-07-05 DIAGNOSIS — D631 Anemia in chronic kidney disease: Secondary | ICD-10-CM | POA: Diagnosis not present

## 2020-07-05 DIAGNOSIS — R062 Wheezing: Secondary | ICD-10-CM | POA: Diagnosis not present

## 2020-07-05 LAB — CBC WITH DIFFERENTIAL/PLATELET
Abs Immature Granulocytes: 0.03 10*3/uL (ref 0.00–0.07)
Basophils Absolute: 0 10*3/uL (ref 0.0–0.1)
Basophils Relative: 0 %
Eosinophils Absolute: 0.2 10*3/uL (ref 0.0–0.5)
Eosinophils Relative: 3 %
HCT: 33.4 % — ABNORMAL LOW (ref 36.0–46.0)
Hemoglobin: 10.4 g/dL — ABNORMAL LOW (ref 12.0–15.0)
Immature Granulocytes: 0 %
Lymphocytes Relative: 25 %
Lymphs Abs: 1.9 10*3/uL (ref 0.7–4.0)
MCH: 30 pg (ref 26.0–34.0)
MCHC: 31.1 g/dL (ref 30.0–36.0)
MCV: 96.3 fL (ref 80.0–100.0)
Monocytes Absolute: 0.6 10*3/uL (ref 0.1–1.0)
Monocytes Relative: 7 %
Neutro Abs: 4.9 10*3/uL (ref 1.7–7.7)
Neutrophils Relative %: 65 %
Platelets: 189 10*3/uL (ref 150–400)
RBC: 3.47 MIL/uL — ABNORMAL LOW (ref 3.87–5.11)
RDW: 15.9 % — ABNORMAL HIGH (ref 11.5–15.5)
WBC: 7.6 10*3/uL (ref 4.0–10.5)
nRBC: 0 % (ref 0.0–0.2)

## 2020-07-05 LAB — COMPREHENSIVE METABOLIC PANEL
ALT: 14 U/L (ref 0–44)
AST: 31 U/L (ref 15–41)
Albumin: 3.5 g/dL (ref 3.5–5.0)
Alkaline Phosphatase: 107 U/L (ref 38–126)
Anion gap: 16 — ABNORMAL HIGH (ref 5–15)
BUN: 23 mg/dL (ref 8–23)
CO2: 29 mmol/L (ref 22–32)
Calcium: 10.1 mg/dL (ref 8.9–10.3)
Chloride: 93 mmol/L — ABNORMAL LOW (ref 98–111)
Creatinine, Ser: 6.12 mg/dL — ABNORMAL HIGH (ref 0.44–1.00)
GFR calc Af Amer: 7 mL/min — ABNORMAL LOW (ref >60)
GFR calc non Af Amer: 6 mL/min — ABNORMAL LOW (ref >60)
Glucose, Bld: 168 mg/dL — ABNORMAL HIGH (ref 70–99)
Potassium: 3.4 mmol/L — ABNORMAL LOW (ref 3.5–5.1)
Sodium: 138 mmol/L (ref 135–145)
Total Bilirubin: 1 mg/dL (ref 0.3–1.2)
Total Protein: 7.3 g/dL (ref 6.5–8.1)

## 2020-07-05 NOTE — ED Notes (Signed)
Pt. Ambulated in the room. Unsteady gait and complained of dizziness. Oxygen Saturation between 94 and 95% during ambulation.

## 2020-07-05 NOTE — Discharge Instructions (Signed)
Please go to your scheduled dialysis session this afternoon.

## 2020-07-05 NOTE — ED Provider Notes (Signed)
[facility] Provider Note  CSN: [id number] Arrival date & time: 07/05/20 5974  Chief Complaint(s) Shortness of Breath Pt transported from home by [facility] pt woke up tonight shob, course wheezing with RA sat of 64%, pt not on home 02. HD MWF, pt given duoneb enroute. 100% on NRB after neb. A & O   HPI [name] is a 69 y.o. female   The history is provided by the patient.  Shortness of Breath Severity:  Severe Onset quality:  Sudden Timing:  Constant Progression:  Improving Context comment:  Awoke her from sleep Relieved by:  Oxygen Worsened by:  Nothing Associated symptoms: cough and wheezing   Associated symptoms: no chest pain and no sputum production     Past Medical History Past Medical History:  Diagnosis Date  . Adenomatous polyp of colon 10/2010, 2006, 2015  . Anemia in CKD (chronic kidney disease) 11/07/2012   s/p blood transfusion.   . Arthritis   . CAD (coronary artery disease)    "something like that"  . CHF (congestive heart failure) ([place])   . Constipation   . Depression with anxiety   . Diverticula, colon   . 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  Had some early rejection then stable function for years, then had slow decline of function and went back on hemodialysis in 2012.  Gets HD TTS schedule at [facility] on [facility] still using L forearm AVF.     Marland Kitchen GERD (gastroesophageal reflux disease)   . GI bleed 2017   felt to be ischemic colitis, last colo 2015  . Headache   . Hyperlipidemia   . Hypertension   . Neurologic gait dysfunction   . Neuromuscular disorder (HCC)    neuropathy hand and legs  . Osteoporosis   . Pneumonia   . 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 Pulmonary edema 12/2019  . Stroke [facility]) 11/2015   TIA  . Weight loss, unintentional    Patient Active Problem List    Diagnosis Date Noted  . NSTEMI (non-ST elevated myocardial infarction) ([place]) 05/14/2020  . Volume overload 05/13/2020  . Hypotension after procedure 03/16/2020  . Chest pain 03/16/2020  . Change in mental state 03/15/2020  . Ventricular tachycardia ([place]) 03/15/2020  . Seizure ([place]) 03/15/2020  . Unresponsive episode 03/15/2020  . Pulmonary edema 01/01/2020  . Complication of vascular access for dialysis 12/21/2019  . Breakdown (mechanical) of surgically created arteriovenous fistula, initial encounter ([place]) 12/18/2019  . Weakness 10/11/2019  . 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 Dependence on renal dialysis ([place]) 07/24/2019  . Iron deficiency anemia, unspecified 06/09/2019  . Age-related osteoporosis without current pathological fracture 04/17/2019  . 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 Coagulation defect, unspecified ([place]) 04/17/2019  . Diarrhea, unspecified 04/17/2019  . Encounter for screening for depression 04/17/2019  . Headache, unspecified 04/17/2019  . 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 Pain, unspecified 04/17/2019  . Polyp of colon 04/17/2019  . Primary generalized (osteo)arthritis 04/17/2019  . Pruritus, unspecified 04/17/2019  . Pure hypercholesterolemia, unspecified 04/17/2019  . Secondary hyperparathyroidism of renal origin ([place]) 04/17/2019  . Gastro-esophageal reflux disease without esophagitis 04/17/2019  . Essential (primary) hypertension 04/17/2019  . Hyperlipidemia, unspecified 04/17/2019  . Transient cerebral ischemic attack, unspecified 04/17/2019  . Hypoxemia 12/13/2018  . Atrial fibrillation ([place]) 03/23/2017  .  Prolonged Q-T interval on ECG 02/24/2017  . Hypokalemia 02/24/2017  . Acute ischemic stroke ([place]) 02/23/2017  . Malnutrition of moderate degree 12/24/2016  . Problem with dialysis access ([place]) 12/21/2016  . Acute ischemic colitis ([place]) 05/16/2016  . Colitis 05/15/2016  . Rectal bleeding 05/15/2016    . Neurologic abnormality 11/19/2015  . Hyperkalemia 11/19/2015  . Anxiety 11/19/2015  . Insomnia 11/19/2015  . Gait instability   . Stroke-like symptom 11/16/2015  . Vestibular neuritis 11/16/2015  . Stroke (cerebrum) ([place]) 11/16/2015  . Dizziness 05/09/2015  . Ataxia 05/09/2015  . H/O: CVA (cerebrovascular accident) 05/09/2015  . Left facial numbness 05/09/2015  . Left leg numbness 05/09/2015  . Hyperlipidemia   . Acute on chronic diastolic heart failure ([place])   . Shortness of breath 04/01/2013  . HTN (hypertension) 04/01/2013  . Cholecystitis, acute 11/07/2012  . End-stage renal disease on hemodialysis ([place]) 11/07/2012  . Anemia in chronic kidney disease 11/07/2012   Home Medication(s) Prior to Admission medications   Medication Sig Start Date End Date Taking? Authorizing Provider  acetaminophen (TYLENOL) 500 MG tablet Take 1 tablet (500 mg total) by mouth every 6 (six) hours as needed. Patient taking differently: Take 500 mg by mouth every 6 (six) hours as needed for mild pain.  09/05/18  Yes Law, Bea Graff, PA-C  allopurinol (ZYLOPRIM) 300 MG tablet Take 150 mg by mouth daily.    Yes [provider]  ALPRAZolam (XANAX) 0.25 MG tablet Take 0.25 mg by mouth daily as needed for anxiety.    Yes [provider]  aspirin EC 81 MG EC tablet Take 1 tablet (81 mg total) by mouth daily. 05/14/20  Yes Black, Lezlie Octave, NP  atorvastatin (LIPITOR) 40 MG tablet Take 1 tablet (40 mg total) by mouth daily. 04/02/20 07/05/29 Yes Amin, Ankit Chirag, MD  AURYXIA 1 GM 210 MG(Fe) tablet Take 420 mg by mouth 3 (three) times daily with meals.    Yes [provider]  B Complex-C-Folic Acid (DIALYVITE 476) 0.8 MG WAFR Take 1 tablet by mouth daily.  10/25/18  Yes [provider]  carvedilol (COREG) 6.25 MG tablet Take 1 tablet (6.25 mg total) by mouth 2 (two) times daily with a meal. 04/02/20 07/05/29 Yes Amin, Ankit Chirag, MD  cinacalcet (SENSIPAR) 30 MG tablet Take 30 mg  by mouth daily. 01/04/20  Yes [provider]  labetalol (NORMODYNE) 200 MG tablet Take 200 mg by mouth daily. 04/14/20  Yes [provider]  lactulose (CHRONULAC) 10 GM/15ML solution Take 20 g by mouth daily as needed for mild constipation.   Yes [provider]  losartan (COZAAR) 50 MG tablet Take 50 mg by mouth daily. 04/23/20  Yes [provider]  ondansetron (ZOFRAN) 8 MG tablet Take 8 mg by mouth 2 (two) times daily as needed for nausea or vomiting.    Yes [provider]  zolpidem (AMBIEN) 10 MG tablet Take 10 mg by mouth at bedtime as needed for sleep.    Yes [provider]  HYDROcodone-acetaminophen (NORCO) 5-325 MG tablet Take 1-2 tablets by mouth every 6 (six) hours as needed for moderate pain or severe pain. Patient not taking: Reported on 07/05/2020 03/15/20   Schnier, Dolores Lory, MD  Past Surgical History Past Surgical History:  Procedure Laterality Date  . AV FISTULA PLACEMENT     for dialysis  . AV FISTULA PLACEMENT Left 11/22/2015   Procedure: ARTERIOVENOUS (AV) FISTULA CREATION-LEFT BRACHIOCEPHALIC;  Surgeon: Serafina Mitchell, MD;  Location: [facility];  Service: Vascular;  Laterality: Left;  . AV FISTULA PLACEMENT Right 03/15/2020   Procedure: INSERTION OF ARTERIOVENOUS (AV) GORE-TEX GRAFT ARM ( BRACHIAL AXILLARY );  Surgeon: Katha Cabal, MD;  Location: [facility];  Service: Vascular;  Laterality: Right;  . BACK SURGERY    . CERVICAL FUSION    . CHOLECYSTECTOMY  12/02/2012   Procedure: LAPAROSCOPIC CHOLECYSTECTOMY WITH INTRAOPERATIVE CHOLANGIOGRAM;  Surgeon: Edward Jolly, MD;  Location: [facility];  Service: General;  Laterality: N/A;  . EYE SURGERY Bilateral    cataract surgery  . EYE SURGERY Left 2019   laser  . HEMATOMA EVACUATION Left 12/24/2016   Procedure: EVACUATION HEMATOMA LEFT UPPER ARM;   Surgeon: Waynetta Sandy, MD;  Location: [facility];  Service: Vascular;  Laterality: Left;  . I & D EXTREMITY Left 12/31/2016   Procedure: IRRIGATION AND DEBRIDEMENT EXTREMITY;  Surgeon: Angelia Mould, MD;  Location: [place];  Service: Vascular;  Laterality: Left;  . INSERTION OF DIALYSIS CATHETER Right 12/24/2016   Procedure: INSERTION OF DIALYSIS CATHETER;  Surgeon: Waynetta Sandy, MD;  Location: [facility];  Service: Vascular;  Laterality: Right;  . INSERTION OF DIALYSIS CATHETER Right 02/04/2017   Procedure: INSERTION OF DIALYSIS CATHETER;  Surgeon: Waynetta Sandy, MD;  Location: [facility];  Service: Vascular;  Laterality: Right;  . KIDNEY TRANSPLANT  1996  . PERIPHERAL VASCULAR CATHETERIZATION Left 10/23/2016   Procedure: Fistulagram;  Surgeon: Elam Dutch, MD;  Location: [facility];  Service: Cardiovascular;  Laterality: Left;  . PERIPHERAL VASCULAR THROMBECTOMY Right 04/16/2020   Procedure: PERIPHERAL VASCULAR THROMBECTOMY;  Surgeon: Katha Cabal, MD;  Location: [place] [facility];  Service: Cardiovascular;  Laterality: Right;  . RESECTION OF ARTERIOVENOUS FISTULA ANEURYSM Left 11/22/2015   Procedure: RESECTION OF LEFT RADIOCEPHALIC FISTULA ANEURYSM ;  Surgeon: Serafina Mitchell, MD;  Location: [facility];  Service: Vascular;  Laterality: Left;  . REVISON OF ARTERIOVENOUS FISTULA Left 12/22/2016   Procedure: REVISON OF LEFT ARTERIOVENOUS FISTULA;  Surgeon: Waynetta Sandy, MD;  Location: [facility];  Service: Vascular;  Laterality: Left;  . REVISON OF ARTERIOVENOUS FISTULA Left 02/04/2017   Procedure: REVISON OF LEFT UPPER ARM ARTERIOVENOUS FISTULA;  Surgeon: Waynetta Sandy, MD;  Location: [place];  Service: Vascular;  Laterality: Left;  . UPPER EXTREMITY ANGIOGRAPHY Bilateral 09/19/2019   Procedure: UPPER EXTREMITY ANGIOGRAPHY;  Surgeon: Katha Cabal, MD;  Location: [facility];  Service: Cardiovascular;  Laterality: Bilateral;    Family History Family History  Problem Relation Age of Onset  . Colon cancer Brother   . Cancer Brother   . Coronary artery disease Mother 31  . Hyperlipidemia Mother   . Hypertension Mother   . Stroke Maternal Aunt   . Esophageal cancer Neg Hx   . Stomach cancer Neg Hx   . Rectal cancer Neg Hx     Social History Social History   Tobacco Use  . Smoking status: Former Smoker    Types: Cigarettes    Quit date: 12/31/1991    Years since quitting: 28.5  . Smokeless tobacco: Never Used  Vaping Use  . Vaping Use: Never used  Substance Use Topics  . Alcohol use: No  Alcohol/week: 0.0 standard drinks  . Drug use: No   Allergies Sulfa antibiotics and Adhesive [tape]  Review of Systems Review of Systems  Respiratory: Positive for cough, shortness of breath and wheezing. Negative for sputum production.   Cardiovascular: Negative for chest pain.   All other systems are reviewed and are negative for acute change except as noted in the HPI  Physical Exam Vital Signs  I have reviewed the triage vital signs BP 117/67   Pulse 95   Temp 97.6 F (36.4 C) (Oral)   Resp 20   Ht 5\' 2"  (1.575 m)   Wt 49 kg   SpO2 100%   BMI 19.76 kg/m   Physical Exam Vitals reviewed.  Constitutional:      General: She is not in acute distress.    Appearance: She is well-developed. She is not diaphoretic.  HENT:     Head: Normocephalic and atraumatic.     Nose: Nose normal.  Eyes:     General: No scleral icterus.       Right eye: No discharge.        Left eye: No discharge.     Conjunctiva/sclera: Conjunctivae normal.     Pupils: Pupils are equal, round, and reactive to light.  Cardiovascular:     Rate and Rhythm: Normal rate and regular rhythm.     Heart sounds: No murmur heard.  No friction rub. No gallop.   Pulmonary:     Effort: Pulmonary effort is normal. No respiratory distress.     Breath sounds: Normal breath sounds. No stridor. No rales.  Abdominal:     General: There  is no distension.     Palpations: Abdomen is soft.     Tenderness: There is no abdominal tenderness.  Musculoskeletal:        General: No tenderness.       Arms:     Cervical back: Normal range of motion and neck supple.  Skin:    General: Skin is warm and dry.     Findings: No erythema or rash.  Neurological:     Mental Status: She is alert and oriented to person, place, and time.     ED Results and Treatments Labs (all labs ordered are listed, but only abnormal results are displayed) Labs Reviewed  CBC WITH DIFFERENTIAL/PLATELET - Abnormal; Notable for the following components:      Result Value   RBC 3.47 (*)    Hemoglobin 10.4 (*)    HCT 33.4 (*)    RDW 15.9 (*)    All other components within normal limits  COMPREHENSIVE METABOLIC PANEL - Abnormal; Notable for the following components:   Potassium 3.4 (*)    Chloride 93 (*)    Glucose, Bld 168 (*)    Creatinine, Ser 6.12 (*)    GFR calc non Af Amer 6 (*)    GFR calc Af Amer 7 (*)    Anion gap 16 (*)    All other components within normal limits  EKG  EKG Interpretation  Date/Time:  Friday July 05 2020 04:36:39 EDT Ventricular Rate:  98 PR Interval:    QRS Duration: 93 QT Interval:  390 QTC Calculation: 498 R Axis:   77 Text Interpretation: Sinus rhythm Left ventricular hypertrophy Borderline prolonged QT interval No acute changes Confirmed by Addison Lank [phone]) on 07/05/2020 4:42:19 AM      Radiology DG Chest Port 1 View  Result Date: 07/05/2020 CLINICAL DATA:  Shortness of breath.  Hypoxia. EXAM: PORTABLE CHEST 1 VIEW COMPARISON:  05/31/202, 10/11/2019.  CT 11/20/2019. FINDINGS: Severe cardiomegaly again noted. Prominent central pulmonary arteries again noted suggesting pulmonary hypertension. Diffuse interstitial prominence again noted, increased from prior exam. Ker[place] B-lines noted.  These findings are most consistent with CHF with interstitial edema. Pneumonitis cannot be excluded. No pleural effusion or pneumothorax. IMPRESSION: 1. Severe cardiomegaly again noted. Prominent central pulmonary arteries again noted suggesting pulmonary hypertension. 2. Diffuse interstitial prominence again noted, increased from prior exam. Findings most consistent with CHF with interstitial edema. Pneumonitis cannot be excluded. Electronically Signed   By: Marcello Moores  Register   On: 07/05/2020 05:43    Pertinent labs & imaging results that were available during my care of the patient were reviewed by me and considered in my medical decision making (see chart for details).  Medications Ordered in ED Medications - No data to display                                                                                                                                  Procedures Procedures  (including critical care time)  Medical Decision Making / ED Course I have reviewed the nursing notes for this encounter and the patient's prior records (if available in EHR or on provided paperwork).   PAIGHTON GODETTE was evaluated in Emergency Department on 07/05/2020 for the symptoms described in the history of present illness. She was evaluated in the context of the global COVID-19 pandemic, which necessitated consideration that the patient might be at risk for infection with the SARS-CoV-2 virus that causes COVID-19. Institutional protocols and algorithms that pertain to the evaluation of patients at risk for COVID-19 are in a state of rapid change based on information released by regulatory bodies including the CDC and federal and state organizations. These policies and algorithms were followed during the patient's care in the ED.  On NRB currently. Feels much better and not in respiratory distress.  Lungs with basilar rales concerning for edema. Taken off of NRB to RA and able to maintain sats >98%.  CXR with  pulmonary edema. Labs reassuring. No hyperkalemia or uremia requiring emergent HD. She is scheduled for HD at noon today. Ambulated and maintained saturations.  Doubt PE. No leukocytosis or productive cough concerning for PNA.  Monitored for several hours w/o decompensation. Stable for DC in order to go to scheduled HD session.      Final Clinical Impression(s) / ED Diagnoses  Final diagnoses:  SOB (shortness of breath)  Acute pulmonary edema (HCC)   The patient appears reasonably screened and/or stabilized for discharge and I doubt any other medical condition or other [facility] requiring further screening, evaluation, or treatment in the ED at this time prior to discharge. Safe for discharge with strict return precautions.  Disposition: Discharge  Condition: Good  I have discussed the results, Dx and Tx plan with the patient/family who expressed understanding and agree(s) with the plan. Discharge instructions discussed at length. The patient/family was given strict return precautions who verbalized understanding of the instructions. No further questions at time of discharge.    ED Discharge Orders    None        Follow Up: [name], MD [facility] [employer] [address] [place] [address] [phone]  Call  As needed      This chart was dictated using voice recognition software.  Despite best efforts to proofread,  errors can occur which can change the documentation meaning.   Fatima Blank, MD 07/05/20 [phone]

## 2020-07-05 NOTE — ED Triage Notes (Signed)
Pt transported [facility] home by [facility] pt woke up tonight shob, course wheezing with RA sat of 64%, pt not on home 02. HD MWF, pt given duoneb enroute. 100% on NRB after neb. A & O

## 2020-07-08 DIAGNOSIS — D631 Anemia in chronic kidney disease: Secondary | ICD-10-CM | POA: Diagnosis not present

## 2020-07-08 DIAGNOSIS — N186 End stage renal disease: Secondary | ICD-10-CM | POA: Diagnosis not present

## 2020-07-08 DIAGNOSIS — Z992 Dependence on renal dialysis: Secondary | ICD-10-CM | POA: Diagnosis not present

## 2020-07-08 DIAGNOSIS — N2581 Secondary hyperparathyroidism of renal origin: Secondary | ICD-10-CM | POA: Diagnosis not present

## 2020-07-10 DIAGNOSIS — N2581 Secondary hyperparathyroidism of renal origin: Secondary | ICD-10-CM | POA: Diagnosis not present

## 2020-07-10 DIAGNOSIS — D631 Anemia in chronic kidney disease: Secondary | ICD-10-CM | POA: Diagnosis not present

## 2020-07-10 DIAGNOSIS — N186 End stage renal disease: Secondary | ICD-10-CM | POA: Diagnosis not present

## 2020-07-10 DIAGNOSIS — Z992 Dependence on renal dialysis: Secondary | ICD-10-CM | POA: Diagnosis not present

## 2020-07-12 DIAGNOSIS — D631 Anemia in chronic kidney disease: Secondary | ICD-10-CM | POA: Diagnosis not present

## 2020-07-12 DIAGNOSIS — Z992 Dependence on renal dialysis: Secondary | ICD-10-CM | POA: Diagnosis not present

## 2020-07-12 DIAGNOSIS — N186 End stage renal disease: Secondary | ICD-10-CM | POA: Diagnosis not present

## 2020-07-12 DIAGNOSIS — N2581 Secondary hyperparathyroidism of renal origin: Secondary | ICD-10-CM | POA: Diagnosis not present

## 2020-07-15 DIAGNOSIS — N186 End stage renal disease: Secondary | ICD-10-CM | POA: Diagnosis not present

## 2020-07-15 DIAGNOSIS — Z992 Dependence on renal dialysis: Secondary | ICD-10-CM | POA: Diagnosis not present

## 2020-07-15 DIAGNOSIS — N2581 Secondary hyperparathyroidism of renal origin: Secondary | ICD-10-CM | POA: Diagnosis not present

## 2020-07-17 DIAGNOSIS — Z992 Dependence on renal dialysis: Secondary | ICD-10-CM | POA: Diagnosis not present

## 2020-07-17 DIAGNOSIS — N2581 Secondary hyperparathyroidism of renal origin: Secondary | ICD-10-CM | POA: Diagnosis not present

## 2020-07-17 DIAGNOSIS — N186 End stage renal disease: Secondary | ICD-10-CM | POA: Diagnosis not present

## 2020-07-18 ENCOUNTER — Ambulatory Visit: Payer: Medicare Other | Admitting: Cardiology

## 2020-07-19 DIAGNOSIS — N2581 Secondary hyperparathyroidism of renal origin: Secondary | ICD-10-CM | POA: Diagnosis not present

## 2020-07-19 DIAGNOSIS — N186 End stage renal disease: Secondary | ICD-10-CM | POA: Diagnosis not present

## 2020-07-19 DIAGNOSIS — Z992 Dependence on renal dialysis: Secondary | ICD-10-CM | POA: Diagnosis not present

## 2020-07-21 ENCOUNTER — Encounter (HOSPITAL_COMMUNITY): Payer: Self-pay

## 2020-07-21 ENCOUNTER — Emergency Department (HOSPITAL_COMMUNITY): Payer: Medicare HMO

## 2020-07-21 ENCOUNTER — Other Ambulatory Visit: Payer: Self-pay

## 2020-07-21 ENCOUNTER — Inpatient Hospital Stay (HOSPITAL_COMMUNITY)
Admission: EM | Admit: 2020-07-21 | Discharge: 2020-07-27 | DRG: 291 | Disposition: A | Discharge status: Skilled Nursing Facility | Payer: Medicare HMO | Attending: Internal Medicine | Admitting: Internal Medicine

## 2020-07-21 DIAGNOSIS — M810 Age-related osteoporosis without current pathological fracture: Secondary | ICD-10-CM | POA: Diagnosis present

## 2020-07-21 DIAGNOSIS — R52 Pain, unspecified: Secondary | ICD-10-CM | POA: Diagnosis not present

## 2020-07-21 DIAGNOSIS — I2510 Atherosclerotic heart disease of native coronary artery without angina pectoris: Secondary | ICD-10-CM | POA: Diagnosis present

## 2020-07-21 DIAGNOSIS — Z823 Family history of stroke: Secondary | ICD-10-CM

## 2020-07-21 DIAGNOSIS — I509 Heart failure, unspecified: Secondary | ICD-10-CM

## 2020-07-21 DIAGNOSIS — I120 Hypertensive chronic kidney disease with stage 5 chronic kidney disease or end stage renal disease: Secondary | ICD-10-CM | POA: Diagnosis not present

## 2020-07-21 DIAGNOSIS — E7800 Pure hypercholesterolemia, unspecified: Secondary | ICD-10-CM | POA: Diagnosis not present

## 2020-07-21 DIAGNOSIS — E8770 Fluid overload, unspecified: Secondary | ICD-10-CM | POA: Diagnosis present

## 2020-07-21 DIAGNOSIS — Z9109 Other allergy status, other than to drugs and biological substances: Secondary | ICD-10-CM

## 2020-07-21 DIAGNOSIS — R112 Nausea with vomiting, unspecified: Secondary | ICD-10-CM | POA: Diagnosis not present

## 2020-07-21 DIAGNOSIS — I132 Hypertensive heart and chronic kidney disease with heart failure and with stage 5 chronic kidney disease, or end stage renal disease: Secondary | ICD-10-CM | POA: Diagnosis present

## 2020-07-21 DIAGNOSIS — Z940 Kidney transplant status: Secondary | ICD-10-CM | POA: Diagnosis not present

## 2020-07-21 DIAGNOSIS — N2889 Other specified disorders of kidney and ureter: Secondary | ICD-10-CM | POA: Diagnosis not present

## 2020-07-21 DIAGNOSIS — I10 Essential (primary) hypertension: Secondary | ICD-10-CM | POA: Diagnosis present

## 2020-07-21 DIAGNOSIS — I700 Atherosclerosis of aorta: Secondary | ICD-10-CM | POA: Diagnosis not present

## 2020-07-21 DIAGNOSIS — J811 Chronic pulmonary edema: Secondary | ICD-10-CM | POA: Diagnosis not present

## 2020-07-21 DIAGNOSIS — R269 Unspecified abnormalities of gait and mobility: Secondary | ICD-10-CM | POA: Diagnosis present

## 2020-07-21 DIAGNOSIS — Z8249 Family history of ischemic heart disease and other diseases of the circulatory system: Secondary | ICD-10-CM

## 2020-07-21 DIAGNOSIS — D631 Anemia in chronic kidney disease: Secondary | ICD-10-CM | POA: Diagnosis present

## 2020-07-21 DIAGNOSIS — M1990 Unspecified osteoarthritis, unspecified site: Secondary | ICD-10-CM | POA: Diagnosis present

## 2020-07-21 DIAGNOSIS — Z83438 Family history of other disorder of lipoprotein metabolism and other lipidemia: Secondary | ICD-10-CM

## 2020-07-21 DIAGNOSIS — N250 Renal osteodystrophy: Secondary | ICD-10-CM | POA: Diagnosis not present

## 2020-07-21 DIAGNOSIS — R4182 Altered mental status, unspecified: Secondary | ICD-10-CM | POA: Diagnosis not present

## 2020-07-21 DIAGNOSIS — E43 Unspecified severe protein-calorie malnutrition: Secondary | ICD-10-CM | POA: Diagnosis present

## 2020-07-21 DIAGNOSIS — F419 Anxiety disorder, unspecified: Secondary | ICD-10-CM | POA: Diagnosis present

## 2020-07-21 DIAGNOSIS — Z7401 Bed confinement status: Secondary | ICD-10-CM | POA: Diagnosis not present

## 2020-07-21 DIAGNOSIS — M109 Gout, unspecified: Secondary | ICD-10-CM | POA: Diagnosis present

## 2020-07-21 DIAGNOSIS — G629 Polyneuropathy, unspecified: Secondary | ICD-10-CM | POA: Diagnosis present

## 2020-07-21 DIAGNOSIS — Z20822 Contact with and (suspected) exposure to covid-19: Secondary | ICD-10-CM | POA: Diagnosis present

## 2020-07-21 DIAGNOSIS — N2581 Secondary hyperparathyroidism of renal origin: Secondary | ICD-10-CM | POA: Diagnosis not present

## 2020-07-21 DIAGNOSIS — Z87891 Personal history of nicotine dependence: Secondary | ICD-10-CM

## 2020-07-21 DIAGNOSIS — J90 Pleural effusion, not elsewhere classified: Secondary | ICD-10-CM | POA: Diagnosis not present

## 2020-07-21 DIAGNOSIS — D259 Leiomyoma of uterus, unspecified: Secondary | ICD-10-CM | POA: Diagnosis not present

## 2020-07-21 DIAGNOSIS — Z79899 Other long term (current) drug therapy: Secondary | ICD-10-CM

## 2020-07-21 DIAGNOSIS — Z86010 Personal history of colonic polyps: Secondary | ICD-10-CM

## 2020-07-21 DIAGNOSIS — I130 Hypertensive heart and chronic kidney disease with heart failure and stage 1 through stage 4 chronic kidney disease, or unspecified chronic kidney disease: Secondary | ICD-10-CM | POA: Diagnosis not present

## 2020-07-21 DIAGNOSIS — M150 Primary generalized (osteo)arthritis: Secondary | ICD-10-CM | POA: Diagnosis not present

## 2020-07-21 DIAGNOSIS — F329 Major depressive disorder, single episode, unspecified: Secondary | ICD-10-CM | POA: Diagnosis present

## 2020-07-21 DIAGNOSIS — K5730 Diverticulosis of large intestine without perforation or abscess without bleeding: Secondary | ICD-10-CM | POA: Diagnosis not present

## 2020-07-21 DIAGNOSIS — M2550 Pain in unspecified joint: Secondary | ICD-10-CM | POA: Diagnosis not present

## 2020-07-21 DIAGNOSIS — Z681 Body mass index (BMI) 19 or less, adult: Secondary | ICD-10-CM

## 2020-07-21 DIAGNOSIS — E860 Dehydration: Secondary | ICD-10-CM | POA: Diagnosis present

## 2020-07-21 DIAGNOSIS — I517 Cardiomegaly: Secondary | ICD-10-CM | POA: Diagnosis not present

## 2020-07-21 DIAGNOSIS — R0602 Shortness of breath: Secondary | ICD-10-CM | POA: Diagnosis present

## 2020-07-21 DIAGNOSIS — Z8673 Personal history of transient ischemic attack (TIA), and cerebral infarction without residual deficits: Secondary | ICD-10-CM

## 2020-07-21 DIAGNOSIS — R0689 Other abnormalities of breathing: Secondary | ICD-10-CM | POA: Diagnosis not present

## 2020-07-21 DIAGNOSIS — I5023 Acute on chronic systolic (congestive) heart failure: Secondary | ICD-10-CM | POA: Diagnosis not present

## 2020-07-21 DIAGNOSIS — F29 Unspecified psychosis not due to a substance or known physiological condition: Secondary | ICD-10-CM | POA: Diagnosis not present

## 2020-07-21 DIAGNOSIS — J810 Acute pulmonary edema: Secondary | ICD-10-CM | POA: Diagnosis not present

## 2020-07-21 DIAGNOSIS — Z7982 Long term (current) use of aspirin: Secondary | ICD-10-CM

## 2020-07-21 DIAGNOSIS — N186 End stage renal disease: Secondary | ICD-10-CM | POA: Diagnosis present

## 2020-07-21 DIAGNOSIS — K219 Gastro-esophageal reflux disease without esophagitis: Secondary | ICD-10-CM | POA: Diagnosis present

## 2020-07-21 DIAGNOSIS — R1084 Generalized abdominal pain: Secondary | ICD-10-CM | POA: Diagnosis not present

## 2020-07-21 DIAGNOSIS — I4891 Unspecified atrial fibrillation: Secondary | ICD-10-CM | POA: Diagnosis present

## 2020-07-21 DIAGNOSIS — E785 Hyperlipidemia, unspecified: Secondary | ICD-10-CM | POA: Diagnosis present

## 2020-07-21 DIAGNOSIS — I959 Hypotension, unspecified: Secondary | ICD-10-CM | POA: Diagnosis not present

## 2020-07-21 DIAGNOSIS — Z992 Dependence on renal dialysis: Secondary | ICD-10-CM | POA: Diagnosis not present

## 2020-07-21 DIAGNOSIS — R0600 Dyspnea, unspecified: Secondary | ICD-10-CM | POA: Diagnosis not present

## 2020-07-21 DIAGNOSIS — R531 Weakness: Secondary | ICD-10-CM | POA: Diagnosis not present

## 2020-07-21 DIAGNOSIS — I5033 Acute on chronic diastolic (congestive) heart failure: Secondary | ICD-10-CM | POA: Diagnosis present

## 2020-07-21 DIAGNOSIS — I21A1 Myocardial infarction type 2: Secondary | ICD-10-CM | POA: Diagnosis not present

## 2020-07-21 DIAGNOSIS — E8779 Other fluid overload: Secondary | ICD-10-CM | POA: Diagnosis not present

## 2020-07-21 DIAGNOSIS — Z882 Allergy status to sulfonamides status: Secondary | ICD-10-CM

## 2020-07-21 DIAGNOSIS — I151 Hypertension secondary to other renal disorders: Secondary | ICD-10-CM | POA: Diagnosis not present

## 2020-07-21 LAB — I-STAT ARTERIAL BLOOD GAS, ED
Acid-Base Excess: 7 mmol/L — ABNORMAL HIGH (ref 0.0–2.0)
Bicarbonate: 30.3 mmol/L — ABNORMAL HIGH (ref 20.0–28.0)
Calcium, Ion: 1.15 mmol/L (ref 1.15–1.40)
HCT: 26 % — ABNORMAL LOW (ref 36.0–46.0)
Hemoglobin: 8.8 g/dL — ABNORMAL LOW (ref 12.0–15.0)
O2 Saturation: 99 %
Patient temperature: 97.9
Potassium: 3.5 mmol/L (ref 3.5–5.1)
Sodium: 140 mmol/L (ref 135–145)
TCO2: 31 mmol/L (ref 22–32)
pCO2 arterial: 34.6 mmHg (ref 32.0–48.0)
pH, Arterial: 7.548 — ABNORMAL HIGH (ref 7.350–7.450)
pO2, Arterial: 135 mmHg — ABNORMAL HIGH (ref 83.0–108.0)

## 2020-07-21 LAB — COMPREHENSIVE METABOLIC PANEL
ALT: 17 U/L (ref 0–44)
AST: 33 U/L (ref 15–41)
Albumin: 3.4 g/dL — ABNORMAL LOW (ref 3.5–5.0)
Alkaline Phosphatase: 91 U/L (ref 38–126)
Anion gap: 19 — ABNORMAL HIGH (ref 5–15)
BUN: 23 mg/dL (ref 8–23)
CO2: 26 mmol/L (ref 22–32)
Calcium: 10.4 mg/dL — ABNORMAL HIGH (ref 8.9–10.3)
Chloride: 96 mmol/L — ABNORMAL LOW (ref 98–111)
Creatinine, Ser: 6.1 mg/dL — ABNORMAL HIGH (ref 0.44–1.00)
GFR calc Af Amer: 7 mL/min — ABNORMAL LOW (ref >60)
GFR calc non Af Amer: 6 mL/min — ABNORMAL LOW (ref >60)
Glucose, Bld: 138 mg/dL — ABNORMAL HIGH (ref 70–99)
Potassium: 3.3 mmol/L — ABNORMAL LOW (ref 3.5–5.1)
Sodium: 141 mmol/L (ref 135–145)
Total Bilirubin: 1.2 mg/dL (ref 0.3–1.2)
Total Protein: 6.8 g/dL (ref 6.5–8.1)

## 2020-07-21 LAB — CBC
HCT: 27.7 % — ABNORMAL LOW (ref 36.0–46.0)
Hemoglobin: 8.8 g/dL — ABNORMAL LOW (ref 12.0–15.0)
MCH: 29.8 pg (ref 26.0–34.0)
MCHC: 31.8 g/dL (ref 30.0–36.0)
MCV: 93.9 fL (ref 80.0–100.0)
Platelets: 204 10*3/uL (ref 150–400)
RBC: 2.95 MIL/uL — ABNORMAL LOW (ref 3.87–5.11)
RDW: 15.7 % — ABNORMAL HIGH (ref 11.5–15.5)
WBC: 7.4 10*3/uL (ref 4.0–10.5)
nRBC: 0 % (ref 0.0–0.2)

## 2020-07-21 LAB — SARS CORONAVIRUS 2 BY RT PCR (HOSPITAL ORDER, PERFORMED IN ~~LOC~~ HOSPITAL LAB): SARS Coronavirus 2: NEGATIVE

## 2020-07-21 LAB — BRAIN NATRIURETIC PEPTIDE: B Natriuretic Peptide: >4500 pg/mL — ABNORMAL HIGH (ref 0.0–100.0)

## 2020-07-21 LAB — LIPASE, BLOOD: Lipase: 35 U/L (ref 11–51)

## 2020-07-21 MED ORDER — CHLORHEXIDINE GLUCONATE CLOTH 2 % EX PADS
6.0000 | MEDICATED_PAD | Freq: Every day | CUTANEOUS | Status: DC
  Administered 2020-07-22: 6 via TOPICAL

## 2020-07-21 MED ORDER — IOHEXOL 300 MG/ML  SOLN
100.0000 mL | Freq: Once | INTRAMUSCULAR | Status: AC | PRN
  Administered 2020-07-21: 100 mL via INTRAVENOUS

## 2020-07-21 MED ORDER — DOXERCALCIFEROL 4 MCG/2ML IV SOLN
3.0000 ug | INTRAVENOUS | Status: DC
  Administered 2020-07-22: 3 ug via INTRAVENOUS
  Filled 2020-07-21 (×3): qty 2

## 2020-07-21 MED ORDER — ONDANSETRON HCL 4 MG PO TABS: 4.0000 mg | ORAL_TABLET | Freq: Four times a day (QID) | ORAL | Status: DC | PRN

## 2020-07-21 MED ORDER — ACETAMINOPHEN 325 MG PO TABS
650.0000 mg | ORAL_TABLET | Freq: Four times a day (QID) | ORAL | Status: DC | PRN
  Administered 2020-07-22: 650 mg via ORAL
  Filled 2020-07-21: qty 2

## 2020-07-21 MED ORDER — ACETAMINOPHEN 650 MG RE SUPP: 650.0000 mg | Freq: Four times a day (QID) | RECTAL | Status: DC | PRN

## 2020-07-21 MED ORDER — ONDANSETRON HCL 4 MG/2ML IJ SOLN
4.0000 mg | Freq: Four times a day (QID) | INTRAMUSCULAR | Status: DC | PRN
  Administered 2020-07-22 – 2020-07-23 (×2): 4 mg via INTRAVENOUS
  Filled 2020-07-21: qty 2

## 2020-07-21 MED ORDER — HEPARIN SODIUM (PORCINE) 5000 UNIT/ML IJ SOLN
5000.0000 [IU] | Freq: Three times a day (TID) | INTRAMUSCULAR | Status: DC
  Administered 2020-07-22: 5000 [IU] via SUBCUTANEOUS
  Filled 2020-07-21 (×2): qty 1

## 2020-07-21 NOTE — ED Provider Notes (Signed)
[facility] EMERGENCY DEPARTMENT Provider Note   CSN: [id number] Arrival date & time: 07/21/20  4098     History Chief Complaint  Patient presents with  . Weakness  . Diarrhea  . Shortness of Breath  . Cough    [name] is a 69 y.o. female.  HPI    69 year old female stage renal disease, on dialysis Monday Wednesday Friday, presents today complaining of nausea, vomiting, one episode of diarrhea, and dyspnea.  Symptoms began yesterday evening.  She has not had fever or chills.  She has received her Covid vaccine bleeding the [facility] series last month.  She has had nonproductive cough.  Symptoms are worse with movement.  EMS reported that her sats were in the low 90s and she was on an tidal CO2 monitoring at 24-25.  No known history of DVT or PE.  Review of records reveal history of chronic diastolic heart failure Past Medical History:  Diagnosis Date  . Adenomatous polyp of colon 10/2010, 2006, 2015  . Anemia in CKD (chronic kidney disease) 11/07/2012   s/p blood transfusion.   . Arthritis   . CAD (coronary artery disease)    "something like that"  . CHF (congestive heart failure) ([place])   . Constipation   . Depression with anxiety   . Diverticula, colon   . 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  Had some early rejection then stable function for years, then had slow decline of function and went back on hemodialysis in 2012.  Gets HD TTS schedule at [facility] on [facility] still using L forearm AVF.     Marland Kitchen GERD (gastroesophageal reflux disease)   . GI bleed 2017   felt to be ischemic colitis, last colo 2015  . Headache   . Hyperlipidemia   . Hypertension   . Neurologic gait dysfunction   . Neuromuscular disorder (HCC)    neuropathy hand and legs  . Osteoporosis   . Pneumonia   . 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 Pulmonary  edema 12/2019  . Stroke [facility]) 11/2015   TIA  . Weight loss, unintentional     Patient Active Problem List   Diagnosis Date Noted  . NSTEMI (non-ST elevated myocardial infarction) ([place]) 05/14/2020  . Volume overload 05/13/2020  . Hypotension after procedure 03/16/2020  . Chest pain 03/16/2020  . Change in mental state 03/15/2020  . Ventricular tachycardia ([place]) 03/15/2020  . Seizure ([place]) 03/15/2020  . Unresponsive episode 03/15/2020  . Pulmonary edema 01/01/2020  . Complication of vascular access for dialysis 12/21/2019  . Breakdown (mechanical) of surgically created arteriovenous fistula, initial encounter ([place]) 12/18/2019  . Weakness 10/11/2019  . 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 Dependence on renal dialysis ([place]) 07/24/2019  . Iron deficiency anemia, unspecified 06/09/2019  . Age-related osteoporosis without current pathological fracture 04/17/2019  . 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 Coagulation defect, unspecified ([place]) 04/17/2019  . Diarrhea, unspecified 04/17/2019  . Encounter for screening for depression 04/17/2019  . Headache, unspecified 04/17/2019  . 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 Pain, unspecified 04/17/2019  . Polyp of colon 04/17/2019  . Primary generalized (osteo)arthritis 04/17/2019  . Pruritus, unspecified 04/17/2019  . Pure hypercholesterolemia, unspecified 04/17/2019  . Secondary hyperparathyroidism of renal origin ([place]) 04/17/2019  . Gastro-esophageal reflux disease without esophagitis 04/17/2019  . Essential (primary)  hypertension 04/17/2019  . Hyperlipidemia, unspecified 04/17/2019  . Transient cerebral ischemic attack, unspecified 04/17/2019  . Hypoxemia 12/13/2018  . Atrial fibrillation ([place]) 03/23/2017  . Prolonged Q-T interval on ECG 02/24/2017  . Hypokalemia 02/24/2017  . Acute ischemic stroke ([place]) 02/23/2017  . Malnutrition of moderate degree 12/24/2016  . Problem with dialysis access  ([place]) 12/21/2016  . Acute ischemic colitis ([place]) 05/16/2016  . Colitis 05/15/2016  . Rectal bleeding 05/15/2016  . Neurologic abnormality 11/19/2015  . Hyperkalemia 11/19/2015  . Anxiety 11/19/2015  . Insomnia 11/19/2015  . Gait instability   . Stroke-like symptom 11/16/2015  . Vestibular neuritis 11/16/2015  . Stroke (cerebrum) ([place]) 11/16/2015  . Dizziness 05/09/2015  . Ataxia 05/09/2015  . H/O: CVA (cerebrovascular accident) 05/09/2015  . Left facial numbness 05/09/2015  . Left leg numbness 05/09/2015  . Hyperlipidemia   . Acute on chronic diastolic heart failure ([place])   . Shortness of breath 04/01/2013  . HTN (hypertension) 04/01/2013  . Cholecystitis, acute 11/07/2012  . End-stage renal disease on hemodialysis ([place]) 11/07/2012  . Anemia in chronic kidney disease 11/07/2012    Past Surgical History:  Procedure Laterality Date  . AV FISTULA PLACEMENT     for dialysis  . AV FISTULA PLACEMENT Left 11/22/2015   Procedure: ARTERIOVENOUS (AV) FISTULA CREATION-LEFT BRACHIOCEPHALIC;  Surgeon: Serafina Mitchell, MD;  Location: [place];  Service: Vascular;  Laterality: Left;  . AV FISTULA PLACEMENT Right 03/15/2020   Procedure: INSERTION OF ARTERIOVENOUS (AV) GORE-TEX GRAFT ARM ( BRACHIAL AXILLARY );  Surgeon: Katha Cabal, MD;  Location: [facility];  Service: Vascular;  Laterality: Right;  . BACK SURGERY    . CERVICAL FUSION    . CHOLECYSTECTOMY  12/02/2012   Procedure: LAPAROSCOPIC CHOLECYSTECTOMY WITH INTRAOPERATIVE CHOLANGIOGRAM;  Surgeon: Edward Jolly, MD;  Location: [place];  Service: General;  Laterality: N/A;  . EYE SURGERY Bilateral    cataract surgery  . EYE SURGERY Left 2019   laser  . HEMATOMA EVACUATION Left 12/24/2016   Procedure: EVACUATION HEMATOMA LEFT UPPER ARM;  Surgeon: Waynetta Sandy, MD;  Location: [place];  Service: Vascular;  Laterality: Left;  . I & D EXTREMITY Left 12/31/2016   Procedure: IRRIGATION AND DEBRIDEMENT EXTREMITY;  Surgeon:  Angelia Mould, MD;  Location: [place];  Service: Vascular;  Laterality: Left;  . INSERTION OF DIALYSIS CATHETER Right 12/24/2016   Procedure: INSERTION OF DIALYSIS CATHETER;  Surgeon: Waynetta Sandy, MD;  Location: [place];  Service: Vascular;  Laterality: Right;  . INSERTION OF DIALYSIS CATHETER Right 02/04/2017   Procedure: INSERTION OF DIALYSIS CATHETER;  Surgeon: Waynetta Sandy, MD;  Location: [place];  Service: Vascular;  Laterality: Right;  . KIDNEY TRANSPLANT  1996  . PERIPHERAL VASCULAR CATHETERIZATION Left 10/23/2016   Procedure: Fistulagram;  Surgeon: Elam Dutch, MD;  Location: [place] [facility];  Service: Cardiovascular;  Laterality: Left;  . PERIPHERAL VASCULAR THROMBECTOMY Right 04/16/2020   Procedure: PERIPHERAL VASCULAR THROMBECTOMY;  Surgeon: Katha Cabal, MD;  Location: [place] [facility];  Service: Cardiovascular;  Laterality: Right;  . RESECTION OF ARTERIOVENOUS FISTULA ANEURYSM Left 11/22/2015   Procedure: RESECTION OF LEFT RADIOCEPHALIC FISTULA ANEURYSM ;  Surgeon: Serafina Mitchell, MD;  Location: [place];  Service: Vascular;  Laterality: Left;  . REVISON OF ARTERIOVENOUS FISTULA Left 12/22/2016   Procedure: REVISON OF LEFT ARTERIOVENOUS FISTULA;  Surgeon: Waynetta Sandy, MD;  Location: [place];  Service: Vascular;  Laterality: Left;  . REVISON OF ARTERIOVENOUS FISTULA  Left 02/04/2017   Procedure: REVISON OF LEFT UPPER ARM ARTERIOVENOUS FISTULA;  Surgeon: Waynetta Sandy, MD;  Location: [place];  Service: Vascular;  Laterality: Left;  . UPPER EXTREMITY ANGIOGRAPHY Bilateral 09/19/2019   Procedure: UPPER EXTREMITY ANGIOGRAPHY;  Surgeon: Katha Cabal, MD;  Location: [place] [facility];  Service: Cardiovascular;  Laterality: Bilateral;     OB History   No obstetric history on file.     Family History  Problem Relation Age of Onset  . Colon cancer Brother   . Cancer Brother   . Coronary artery disease Mother 62  .  Hyperlipidemia Mother   . Hypertension Mother   . Stroke Maternal Aunt   . Esophageal cancer Neg Hx   . Stomach cancer Neg Hx   . Rectal cancer Neg Hx     Social History   Tobacco Use  . Smoking status: Former Smoker    Types: Cigarettes    Quit date: 12/31/1991    Years since quitting: 28.5  . Smokeless tobacco: Never Used  Vaping Use  . Vaping Use: Never used  Substance Use Topics  . Alcohol use: No    Alcohol/week: 0.0 standard drinks  . Drug use: No    Home Medications Prior to Admission medications   Medication Sig Start Date End Date Taking? Authorizing Provider  acetaminophen (TYLENOL) 500 MG tablet Take 1 tablet (500 mg total) by mouth every 6 (six) hours as needed. Patient taking differently: Take 500 mg by mouth every 6 (six) hours as needed for mild pain.  09/05/18   Law, Bea Graff, PA-C  allopurinol (ZYLOPRIM) 300 MG tablet Take 150 mg by mouth daily.     [provider]  ALPRAZolam Duanne Moron) 0.25 MG tablet Take 0.25 mg by mouth daily as needed for anxiety.     [provider]  aspirin EC 81 MG EC tablet Take 1 tablet (81 mg total) by mouth daily. 05/14/20   Black, Lezlie Octave, NP  atorvastatin (LIPITOR) 40 MG tablet Take 1 tablet (40 mg total) by mouth daily. 04/02/20 07/05/29  Amin, Ankit Chirag, MD  AURYXIA 1 GM 210 MG(Fe) tablet Take 420 mg by mouth 3 (three) times daily with meals.     [provider]  B Complex-C-Folic Acid (DIALYVITE 161) 0.8 MG WAFR Take 1 tablet by mouth daily.  10/25/18   [provider]  carvedilol (COREG) 6.25 MG tablet Take 1 tablet (6.25 mg total) by mouth 2 (two) times daily with a meal. 04/02/20 07/05/29  Amin, Jeanella Flattery, MD  cinacalcet (SENSIPAR) 30 MG tablet Take 30 mg by mouth daily. 01/04/20   [provider]  HYDROcodone-acetaminophen (NORCO) 5-325 MG tablet Take 1-2 tablets by mouth every 6 (six) hours as needed for moderate pain or severe pain. Patient not taking: Reported on 07/05/2020 03/15/20    Schnier, Dolores Lory, MD  labetalol (NORMODYNE) 200 MG tablet Take 200 mg by mouth daily. 04/14/20   [provider]  lactulose (CHRONULAC) 10 GM/15ML solution Take 20 g by mouth daily as needed for mild constipation.    [provider]  losartan (COZAAR) 50 MG tablet Take 50 mg by mouth daily. 04/23/20   [provider]  ondansetron (ZOFRAN) 8 MG tablet Take 8 mg by mouth 2 (two) times daily as needed for nausea or vomiting.     [provider]  zolpidem (AMBIEN) 10 MG tablet Take 10 mg by mouth at bedtime as needed for sleep.     [provider]    Allergies    Sulfa antibiotics and Adhesive [tape]  Review of Systems   Review of Systems  All other systems reviewed and are negative.   Physical Exam Updated Vital Signs BP (!) 160/93 (BP Location: Right Leg)   Pulse 88   Temp 97.9 F (36.6 C) (Oral)   Resp 20   Ht 1.575 m (5\' 2" )   Wt 61.2 kg   SpO2 91%   BMI 24.69 kg/m   Physical Exam Vitals and nursing note reviewed.  HENT:     Head: Normocephalic.     Mouth/Throat:     Mouth: Mucous membranes are moist.  Eyes:     Pupils: Pupils are equal, round, and reactive to light.  Cardiovascular:     Rate and Rhythm: Normal rate.  Pulmonary:     Effort: Pulmonary effort is normal.  Abdominal:     Palpations: Abdomen is soft.     Comments: Diffuse upper abdominal tenderness to palpation  Musculoskeletal:        General: Normal range of motion.     Cervical back: Normal range of motion.     Comments: Clotted dialysis graft left upper extremity Active graft with thrill right upper extremity  Skin:    General: Skin is warm and dry.     Capillary Refill: Capillary refill takes less than 2 seconds.  Neurological:     Mental Status: She is alert.     Comments: Generalized weakness Some facial asymmetry At baseline per patient  Psychiatric:        Mood and Affect: Mood normal.     ED Results / Procedures / Treatments   Labs (all  labs ordered are listed, but only abnormal results are displayed) Labs Reviewed - No data to display  EKG EKG Interpretation  Date/Time:  Sunday July 21 2020 10:02:40 EDT Ventricular Rate:  88 PR Interval:    QRS Duration: 94 QT Interval:  420 QTC Calculation: 509 R Axis:   43 Text Interpretation: Sinus rhythm Ventricular premature complex Abnormal inferior Q waves Probable anterolateral infarct, old Abnormal T, consider ischemia, lateral leads Prolonged QT interval No STEMI Confirmed by Nanda Quinton [phone]) on 07/21/2020 10:07:42 AM   Radiology CT ABDOMEN PELVIS W CONTRAST  Result Date: 07/21/2020 CLINICAL DATA:  Epigastric abdominal pain. EXAM: CT ABDOMEN AND PELVIS WITH CONTRAST TECHNIQUE: Multidetector CT imaging of the abdomen and pelvis was performed using the standard protocol following bolus administration of intravenous contrast. CONTRAST:  170mL OMNIPAQUE IOHEXOL 300 MG/ML  SOLN COMPARISON:  05/15/2016 CT abdomen/pelvis. FINDINGS: Lower chest: Interlobular septal thickening throughout both lung bases. Cardiomegaly. Small dependent bilateral pleural effusions with dependent bibasilar atelectasis. Coronary atherosclerosis. Hepatobiliary: Normal liver size. No liver mass. Cholecystectomy. No biliary ductal dilatation. Pancreas: Normal, with no mass or duct dilation. Spleen: Normal size. No mass. Adrenals/Urinary Tract: Normal adrenals. Severe bilateral renal atrophy. No hydronephrosis. Simple bilateral renal cysts, largest 5.0 cm in interpolar left kidney. Normal collapsed bladder. Stomach/Bowel: Normal non-distended stomach. Normal caliber small bowel with no small bowel wall thickening. Normal appendix. Mild left colonic diverticulosis with no large bowel wall thickening or acute pericolonic fat stranding. Vascular/Lymphatic: Atherosclerotic nonaneurysmal abdominal aorta. Contrast reflux into the IVC and hepatic veins. Patent portal, splenic, hepatic and renal veins. No pathologically  enlarged lymph nodes in the abdomen or pelvis. Reproductive: Stable mildly enlarged myomatous uterus. No adnexal masses. Other: No pneumoperitoneum. No ascites. Soft tissue density 5.0 x 2.7 cm structure in the anterolateral left pelvis (  series 3/image 40) along the anterior margin of the left external iliac artery with scattered internal coarse calcifications, unchanged since 05/14/2016 CT, most compatible with a chronic hematoma. Musculoskeletal: No aggressive appearing focal osseous lesions. Severe chronic erosive change at the L4-5 disc, not significantly changed. Prosthesis between the L4 and L5 spinous processes. Renal osteodystrophy. IMPRESSION: 1. Cardiomegaly. Contrast reflux into the IVC and hepatic veins, suggesting right heart failure. Small dependent bilateral pleural effusions. Interlobular septal thickening throughout both lung bases, suggesting mild cardiogenic pulmonary edema. 2. No acute abnormality in the abdomen or pelvis. No evidence of bowel obstruction or acute bowel inflammation. Mild left colonic diverticulosis, with no evidence of acute diverticulitis. 3. Stable suspected chronic hematoma in the anterolateral left pelvis, see comments. 4. Stable mildly enlarged myomatous uterus. 5. Aortic Atherosclerosis (ICD10-I70.0). Electronically Signed   By: Ilona Sorrel M.D.   On: 07/21/2020 13:34   DG Chest Port 1 View  Result Date: 07/21/2020 CLINICAL DATA:  Dyspnea EXAM: PORTABLE CHEST 1 VIEW COMPARISON:  07/05/2020 FINDINGS: Cardiomegaly with increased interstitial markings in the right upper lobe and bilateral lower lobes, favoring interstitial edema over infection. Suspected trace left pleural effusion. No pneumothorax. Thoracic aortic atherosclerosis. IMPRESSION: Cardiomegaly with suspected mild interstitial edema and small left pleural effusion. Electronically Signed   By: Julian Hy M.D.   On: 07/21/2020 10:35    Procedures Procedures (including critical care  time)  Medications Ordered in ED Medications - No data to display  ED Course  I have reviewed the triage vital signs and the nursing notes.  Pertinent labs & imaging results that were available during my care of the patient were reviewed by me and considered in my medical decision making (see chart for details).    MDM Rules/Calculators/A&P                          69 year old female end-stage renal disease on dialysis compliant with dialysis and last dialysis on Friday presents today with dyspnea.  On evaluation here.  Patient has EKG without acute ST elevation.  BNP is elevated greater than 4500.  Chest x-Mallori Araque shows increased cardiomegaly and interstitial edema with small left pleural effusion Reported n/v/d- abdominal ttp upper abdomen- ct obtained without acute abnormality, ttp resolved on reexam. Review of old records reveal similar episode on July 23 Patient stable here in the emergency department. Nephrology being consulted for dialysis Plan admission to medicine  Discussed with Dr. Jonnie Finner Patient to be reevaluated after dialysis.  .  She continues at baseline weakness from prior stroke.   Care discussed with Dr. Reather Converse and he will reevaluate after dialysis  Final Clinical Impression(s) / ED Diagnoses Final diagnoses:  Acute on chronic congestive heart failure, unspecified heart failure type ([place])  ESRD (end stage renal disease) [facility])    Rx / DC Orders ED Discharge Orders    None       Pattricia Boss, MD 07/21/20 1626

## 2020-07-21 NOTE — Progress Notes (Signed)
Called Dr. Reather Converse in regards to admission for the patient and was advised that admission was not needed as plan for the patient was to be discharged after hemodialysis today.

## 2020-07-21 NOTE — ED Notes (Signed)
Patient transported to HD 

## 2020-07-21 NOTE — ED Provider Notes (Addendum)
Patient presented to the ER with diarrhea and shortness of breath.  Patient was concern clinically for fluid overload secondary to end-stage renal disease.  Previous provider order dialysis and signed out was to reassess and if patient improved she was comfortable going home.  On reassessment patient smiling no respiratory difficulty, lungs clear.  Chest x-ray reviewed from previously showing small effusions.  Patient has close outpatient follow-up.  Discussed with nursing for ambulation, oral fluids and discharge if she continues to feel well.  Unfortunately patient unable to ambulate at her baseline due to general weakness. Patient kept falling down on the bed with assistance. Patient hospitalist for admission for further evaluation.   Discussed with hospitalist Dr. Alcario Drought for admission for further work-up, physical therapy and evaluation. Elnora Morrison, MD 07/21/20 2224    Elnora Morrison, MD 07/21/20 [id number]    Elnora Morrison, MD 07/21/20 2328

## 2020-07-21 NOTE — ED Notes (Signed)
[name] son [phone] would like an update on the pt

## 2020-07-21 NOTE — ED Triage Notes (Signed)
Pt from home with [facility] with multiple complaints:  Generalized weakness, n/v/d, son and cough for the past 24 hrs, pt has been vaccinated for COVID. VSS. Dialysis pt. 20G L hand

## 2020-07-21 NOTE — Consult Note (Addendum)
Renal Service Consult Note [facility]  [name] 07/21/2020 [name] Requesting Physician:  Dr D. Ray  Reason for Consult:  ESRD pt w/ SOB HPI: The patient is a 69 y.o. 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 CXR showing early CHF/ IS edema, RA sat 92% improved to 99% on 2L. Asked to see for dialysis.   Pt has not missed HD, last HD was on Friday. Main c/o is SOB. Non prod cough, no chest pain at this time.  Having diarrhea and went for abd CT this afternoon.    ROS  denies CP  no joint pain   no HA  no blurry vision  no rash  no diarrhea  no nausea/ vomiting   Past Medical History  Past Medical History:  Diagnosis Date  . Adenomatous polyp of colon 10/2010, 2006, 2015  . Anemia in CKD (chronic kidney disease) 11/07/2012   s/p blood transfusion.   . Arthritis   . CAD (coronary artery disease)    "something like that"  . CHF (congestive heart failure) ([place])   . Constipation   . Depression with anxiety   . Diverticula, colon   . 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  Had some early rejection then stable function for years, then had slow decline of function and went back on hemodialysis in 2012.  Gets HD TTS schedule at [facility] on [facility] still using L forearm AVF.     Marland Kitchen GERD (gastroesophageal reflux disease)   . GI bleed 2017   felt to be ischemic colitis, last colo 2015  . Headache   . Hyperlipidemia   . Hypertension   . Neurologic gait dysfunction   . Neuromuscular disorder (HCC)    neuropathy hand and legs  . Osteoporosis   . Pneumonia   . 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 Pulmonary edema 12/2019  . Stroke [facility]) 11/2015   TIA  . Weight loss, unintentional    Past Surgical History  Past Surgical History:  Procedure Laterality Date   . AV FISTULA PLACEMENT     for dialysis  . AV FISTULA PLACEMENT Left 11/22/2015   Procedure: ARTERIOVENOUS (AV) FISTULA CREATION-LEFT BRACHIOCEPHALIC;  Surgeon: Serafina Mitchell, MD;  Location: [place];  Service: Vascular;  Laterality: Left;  . AV FISTULA PLACEMENT Right 03/15/2020   Procedure: INSERTION OF ARTERIOVENOUS (AV) GORE-TEX GRAFT ARM ( BRACHIAL AXILLARY );  Surgeon: Katha Cabal, MD;  Location: [facility];  Service: Vascular;  Laterality: Right;  . BACK SURGERY    . CERVICAL FUSION    . CHOLECYSTECTOMY  12/02/2012   Procedure: LAPAROSCOPIC CHOLECYSTECTOMY WITH INTRAOPERATIVE CHOLANGIOGRAM;  Surgeon: Edward Jolly, MD;  Location: [place];  Service: General;  Laterality: N/A;  . EYE SURGERY Bilateral    cataract surgery  . EYE SURGERY Left 2019   laser  . HEMATOMA EVACUATION Left 12/24/2016   Procedure: EVACUATION HEMATOMA LEFT UPPER ARM;  Surgeon: Waynetta Sandy, MD;  Location: [place];  Service: Vascular;  Laterality: Left;  . I & D EXTREMITY Left 12/31/2016   Procedure: IRRIGATION AND DEBRIDEMENT EXTREMITY;  Surgeon: Angelia Mould, MD;  Location: [place];  Service: Vascular;  Laterality: Left;  . INSERTION OF DIALYSIS CATHETER Right 12/24/2016  Procedure: INSERTION OF DIALYSIS CATHETER;  Surgeon: Waynetta Sandy, MD;  Location: [place];  Service: Vascular;  Laterality: Right;  . INSERTION OF DIALYSIS CATHETER Right 02/04/2017   Procedure: INSERTION OF DIALYSIS CATHETER;  Surgeon: Waynetta Sandy, MD;  Location: [place];  Service: Vascular;  Laterality: Right;  . KIDNEY TRANSPLANT  1996  . PERIPHERAL VASCULAR CATHETERIZATION Left 10/23/2016   Procedure: Fistulagram;  Surgeon: Elam Dutch, MD;  Location: [place] CV LAB;  Service: Cardiovascular;  Laterality: Left;  . PERIPHERAL VASCULAR THROMBECTOMY Right 04/16/2020   Procedure: PERIPHERAL VASCULAR THROMBECTOMY;  Surgeon: Katha Cabal, MD;  Location: [place] CV LAB;  Service:  Cardiovascular;  Laterality: Right;  . RESECTION OF ARTERIOVENOUS FISTULA ANEURYSM Left 11/22/2015   Procedure: RESECTION OF LEFT RADIOCEPHALIC FISTULA ANEURYSM ;  Surgeon: Serafina Mitchell, MD;  Location: [place];  Service: Vascular;  Laterality: Left;  . REVISON OF ARTERIOVENOUS FISTULA Left 12/22/2016   Procedure: REVISON OF LEFT ARTERIOVENOUS FISTULA;  Surgeon: Waynetta Sandy, MD;  Location: [place];  Service: Vascular;  Laterality: Left;  . REVISON OF ARTERIOVENOUS FISTULA Left 02/04/2017   Procedure: REVISON OF LEFT UPPER ARM ARTERIOVENOUS FISTULA;  Surgeon: Waynetta Sandy, MD;  Location: [place];  Service: Vascular;  Laterality: Left;  . UPPER EXTREMITY ANGIOGRAPHY Bilateral 09/19/2019   Procedure: UPPER EXTREMITY ANGIOGRAPHY;  Surgeon: Katha Cabal, MD;  Location: [place] CV LAB;  Service: Cardiovascular;  Laterality: Bilateral;   Family History  Family History  Problem Relation Age of Onset  . Colon cancer Brother   . Cancer Brother   . Coronary artery disease Mother 80  . Hyperlipidemia Mother   . Hypertension Mother   . Stroke Maternal Aunt   . Esophageal cancer Neg Hx   . Stomach cancer Neg Hx   . Rectal cancer Neg Hx    Social History  reports that she quit smoking about 28 years ago. Her smoking use included cigarettes. She has never used smokeless tobacco. She reports that she does not drink alcohol and does not use drugs. Allergies  Allergies  Allergen Reactions  . Sulfa Antibiotics Other (See Comments)    Both parents allergic-so will not take  . Adhesive [Tape] Itching   Home medications Prior to Admission medications   Medication Sig Start Date End Date Taking? Authorizing Provider  acetaminophen (TYLENOL) 500 MG tablet Take 1 tablet (500 mg total) by mouth every 6 (six) hours as needed. Patient taking differently: Take 500 mg by mouth every 6 (six) hours as needed for mild pain.  09/05/18   Law, Bea Graff, PA-C  allopurinol (ZYLOPRIM) 300  MG tablet Take 150 mg by mouth daily.     [provider]  ALPRAZolam Duanne Moron) 0.25 MG tablet Take 0.25 mg by mouth daily as needed for anxiety.     [provider]  aspirin EC 81 MG EC tablet Take 1 tablet (81 mg total) by mouth daily. 05/14/20   Black, Lezlie Octave, NP  atorvastatin (LIPITOR) 40 MG tablet Take 1 tablet (40 mg total) by mouth daily. 04/02/20 07/05/29  Amin, Ankit Chirag, MD  AURYXIA 1 GM 210 MG(Fe) tablet Take 420 mg by mouth 3 (three) times daily with meals.     [provider]  B Complex-C-Folic Acid (DIALYVITE 332) 0.8 MG WAFR Take 1 tablet by mouth daily.  10/25/18   [provider]  carvedilol (COREG) 6.25 MG tablet Take 1 tablet (6.25 mg total) by mouth 2 (two) times  daily with a meal. 04/02/20 07/05/29  Amin, Jeanella Flattery, MD  cinacalcet (SENSIPAR) 30 MG tablet Take 30 mg by mouth daily. 01/04/20   [provider]  HYDROcodone-acetaminophen (NORCO) 5-325 MG tablet Take 1-2 tablets by mouth every 6 (six) hours as needed for moderate pain or severe pain. Patient not taking: Reported on 07/05/2020 03/15/20   Schnier, Dolores Lory, MD  labetalol (NORMODYNE) 200 MG tablet Take 200 mg by mouth daily. 04/14/20   [provider]  lactulose (CHRONULAC) 10 GM/15ML solution Take 20 g by mouth daily as needed for mild constipation.    [provider]  losartan (COZAAR) 50 MG tablet Take 50 mg by mouth daily. 04/23/20   [provider]  ondansetron (ZOFRAN) 8 MG tablet Take 8 mg by mouth 2 (two) times daily as needed for nausea or vomiting.     [provider]  zolpidem (AMBIEN) 10 MG tablet Take 10 mg by mouth at bedtime as needed for sleep.     [provider]     Vitals:   07/21/20 4970 07/21/20 0955  BP: (!) 160/93   Pulse: 88   Resp: 20   Temp: 97.9 F (36.6 C)   TempSrc: Oral   SpO2: 91%   Weight:  61.2 kg  Height:  5\' 2"  (1.575 m)   Exam Gen alert pleasant elderly , frail, not in distress No rash,  cyanosis or gangrene Sclera anicteric, throat clear  No jvd or bruits Chest  RRR no MRG, +transmitted bruit from L avf Abd soft ntnd no mass or ascites +bs Ext trace pretib edema Neuro is alert, Ox 3 , nf  LUA AVF+/ RUA AVG + bruit ?   Home meds:  - labetalol 200 qd/ ecasa 81 qd/ lipitor 40/ coreg 6.25 bid/ losartan 50 qd  - sensipar 30 qd/ auryxia 420 tid ac  - lactulose 20 gm qd prn/ allopurinol 150 qd  - norco, xanax, ambien prn   - prn's/ vitamins/ supplements    CXR - IMPRESSION: Cardiomegaly with suspected mild interstitial edema and small left pleural effusion.    OP HD: MWF HD  4h  350/1.5  48.5kg   2/2 bath P4  RUA AVG  Hep none  (L AVF working but not using anymore)   Hect 3 ug tiw   Mircera 30 mg q2wks, last 7/12   Assessment/ Plan: 1. SOB - pulm edema presumably due to vol overload. Has not been missing HD, may be losing body wt. Not in distress but prob needs HD today. Will plan HD later tonight, pt is stable on [place] now. Will do HD again tomorrow to get more vol off and get back on schedule.  2. ESRD - on HD MWF. See above. Using R arm AVG now (L AVF still works but aneurysmal so not using).  3. 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 HTN - on 2 beta-blockers per home med list. According to our home meds list for OP HD, she is on the coreg , but not on labetalol or losartan.  5. Gout - allopurinol 6. MBD ckd - cont sensipar, binder, hect IV w hd 7. Anemia ckd - Hb 8.8, last esa 7/12 , was on q4wks, now on a 2wks. No need for esa here, will resume most likely at OP HD later this week.       Kelly Splinter  MD 07/21/2020, 12:41 PM  Recent Labs  Lab 07/21/20 0950 07/21/20 1119  WBC 7.4  --  HGB 8.8* 8.8*   Recent Labs  Lab 07/21/20 0950 07/21/20 1119  K 3.3* 3.5  BUN 23  --   CREATININE 6.10*  --   CALCIUM 10.4*  --

## 2020-07-21 NOTE — ED Notes (Addendum)
Pt was not able to walk with a walker pt kept falling down on the bed with assistance pt would stand and fall right back down on bed

## 2020-07-21 NOTE — ED Notes (Signed)
Pt placed on 2L [place]

## 2020-07-21 NOTE — ED Notes (Signed)
Patient transported to CT 

## 2020-07-22 DIAGNOSIS — E8779 Other fluid overload: Secondary | ICD-10-CM

## 2020-07-22 DIAGNOSIS — N186 End stage renal disease: Secondary | ICD-10-CM | POA: Diagnosis not present

## 2020-07-22 DIAGNOSIS — I151 Hypertension secondary to other renal disorders: Secondary | ICD-10-CM | POA: Diagnosis not present

## 2020-07-22 DIAGNOSIS — N2889 Other specified disorders of kidney and ureter: Secondary | ICD-10-CM

## 2020-07-22 DIAGNOSIS — Z992 Dependence on renal dialysis: Secondary | ICD-10-CM

## 2020-07-22 DIAGNOSIS — I5033 Acute on chronic diastolic (congestive) heart failure: Secondary | ICD-10-CM | POA: Diagnosis not present

## 2020-07-22 DIAGNOSIS — R531 Weakness: Secondary | ICD-10-CM | POA: Diagnosis not present

## 2020-07-22 LAB — CBC
HCT: 27 % — ABNORMAL LOW (ref 36.0–46.0)
Hemoglobin: 8.6 g/dL — ABNORMAL LOW (ref 12.0–15.0)
MCH: 29.8 pg (ref 26.0–34.0)
MCHC: 31.9 g/dL (ref 30.0–36.0)
MCV: 93.4 fL (ref 80.0–100.0)
Platelets: 212 10*3/uL (ref 150–400)
RBC: 2.89 MIL/uL — ABNORMAL LOW (ref 3.87–5.11)
RDW: 16 % — ABNORMAL HIGH (ref 11.5–15.5)
WBC: 5.6 10*3/uL (ref 4.0–10.5)
nRBC: 0 % (ref 0.0–0.2)

## 2020-07-22 LAB — BASIC METABOLIC PANEL
Anion gap: 14 (ref 5–15)
BUN: 13 mg/dL (ref 8–23)
CO2: 26 mmol/L (ref 22–32)
Calcium: 10.5 mg/dL — ABNORMAL HIGH (ref 8.9–10.3)
Chloride: 97 mmol/L — ABNORMAL LOW (ref 98–111)
Creatinine, Ser: 3.64 mg/dL — ABNORMAL HIGH (ref 0.44–1.00)
GFR calc Af Amer: 14 mL/min — ABNORMAL LOW (ref >60)
GFR calc non Af Amer: 12 mL/min — ABNORMAL LOW (ref >60)
Glucose, Bld: 82 mg/dL (ref 70–99)
Potassium: 4 mmol/L (ref 3.5–5.1)
Sodium: 137 mmol/L (ref 135–145)

## 2020-07-22 MED ORDER — ALLOPURINOL 300 MG PO TABS
150.0000 mg | ORAL_TABLET | Freq: Every day | ORAL | Status: DC
  Administered 2020-07-22 – 2020-07-27 (×6): 150 mg via ORAL
  Filled 2020-07-22 (×5): qty 1
  Filled 2020-07-22: qty 0.5
  Filled 2020-07-22: qty 1

## 2020-07-22 MED ORDER — ZOLPIDEM TARTRATE 5 MG PO TABS
5.0000 mg | ORAL_TABLET | Freq: Every evening | ORAL | Status: DC | PRN
  Administered 2020-07-22 – 2020-07-25 (×2): 5 mg via ORAL
  Filled 2020-07-22 (×2): qty 1

## 2020-07-22 MED ORDER — FERRIC CITRATE 1 GM 210 MG(FE) PO TABS
420.0000 mg | ORAL_TABLET | Freq: Three times a day (TID) | ORAL | Status: DC
  Administered 2020-07-22 – 2020-07-27 (×15): 420 mg via ORAL
  Filled 2020-07-22 (×14): qty 2

## 2020-07-22 MED ORDER — CINACALCET HCL 30 MG PO TABS
30.0000 mg | ORAL_TABLET | Freq: Every day | ORAL | Status: DC
  Administered 2020-07-22 – 2020-07-27 (×6): 30 mg via ORAL
  Filled 2020-07-22 (×6): qty 1

## 2020-07-22 MED ORDER — DOXERCALCIFEROL 4 MCG/2ML IV SOLN
INTRAVENOUS | Status: AC
  Filled 2020-07-22: qty 2

## 2020-07-22 MED ORDER — ATORVASTATIN CALCIUM 40 MG PO TABS
40.0000 mg | ORAL_TABLET | Freq: Every day | ORAL | Status: DC
  Administered 2020-07-22 – 2020-07-27 (×6): 40 mg via ORAL
  Filled 2020-07-22 (×6): qty 1

## 2020-07-22 MED ORDER — ALPRAZOLAM 0.25 MG PO TABS
0.2500 mg | ORAL_TABLET | Freq: Every day | ORAL | Status: DC | PRN
  Administered 2020-07-26: 0.25 mg via ORAL
  Filled 2020-07-22: qty 1

## 2020-07-22 MED ORDER — CARVEDILOL 6.25 MG PO TABS
6.2500 mg | ORAL_TABLET | Freq: Two times a day (BID) | ORAL | Status: DC
  Administered 2020-07-23 (×2): 6.25 mg via ORAL
  Filled 2020-07-22 (×4): qty 1

## 2020-07-22 MED ORDER — ONDANSETRON HCL 4 MG/2ML IJ SOLN
INTRAMUSCULAR | Status: AC
  Filled 2020-07-22: qty 2

## 2020-07-22 MED ORDER — RENA-VITE PO TABS
1.0000 | ORAL_TABLET | Freq: Every day | ORAL | Status: DC
  Administered 2020-07-22 – 2020-07-27 (×6): 1 via ORAL
  Filled 2020-07-22 (×6): qty 1

## 2020-07-22 MED ORDER — ASPIRIN EC 81 MG PO TBEC
81.0000 mg | DELAYED_RELEASE_TABLET | Freq: Every day | ORAL | Status: DC
  Administered 2020-07-22 – 2020-07-27 (×6): 81 mg via ORAL
  Filled 2020-07-22 (×6): qty 1

## 2020-07-22 MED ORDER — LACTULOSE 10 GM/15ML PO SOLN
20.0000 g | Freq: Every day | ORAL | Status: DC | PRN
  Administered 2020-07-26: 20 g via ORAL
  Filled 2020-07-22 (×2): qty 30

## 2020-07-22 MED ORDER — LOSARTAN POTASSIUM 50 MG PO TABS
50.0000 mg | ORAL_TABLET | Freq: Every day | ORAL | Status: DC
  Administered 2020-07-23: 50 mg via ORAL
  Filled 2020-07-22 (×2): qty 1

## 2020-07-22 NOTE — Procedures (Signed)
   I was present at this dialysis session, have reviewed the session itself and made  appropriate changes Kelly Splinter MD [place] pager [phone]   07/21/2020, 7:35 PM

## 2020-07-22 NOTE — Evaluation (Signed)
Clinical/Bedside Swallow Evaluation Patient Details  Name: [name] MRN: [id number] Date of Birth: 1951-01-01  Today's Date: 07/22/2020 Time: SLP Start Time (ACUTE ONLY): 1107 SLP Stop Time (ACUTE ONLY): 1125 SLP Time Calculation (min) (ACUTE ONLY): 18 min  Past Medical History:  Past Medical History:  Diagnosis Date  . Adenomatous polyp of colon 10/2010, 2006, 2015  . Anemia in CKD (chronic kidney disease) 11/07/2012   s/p blood transfusion.   . Arthritis   . CAD (coronary artery disease)    "something like that"  . CHF (congestive heart failure) ([place])   . Constipation   . Depression with anxiety   . Diverticula, colon   . 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  Had some early rejection then stable function for years, then had slow decline of function and went back on hemodialysis in 2012.  Gets HD TTS schedule at [facility] on [facility] still using L forearm AVF.     Marland Kitchen GERD (gastroesophageal reflux disease)   . GI bleed 2017   felt to be ischemic colitis, last colo 2015  . Headache   . Hyperlipidemia   . Hypertension   . Neurologic gait dysfunction   . Neuromuscular disorder (HCC)    neuropathy hand and legs  . Osteoporosis   . Pneumonia   . 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 Pulmonary edema 12/2019  . Stroke [facility]) 11/2015   TIA  . Weight loss, unintentional    Past Surgical History:  Past Surgical History:  Procedure Laterality Date  . AV FISTULA PLACEMENT     for dialysis  . AV FISTULA PLACEMENT Left 11/22/2015   Procedure: ARTERIOVENOUS (AV) FISTULA CREATION-LEFT BRACHIOCEPHALIC;  Surgeon: Serafina Mitchell, MD;  Location: [place];  Service: Vascular;  Laterality: Left;  . AV FISTULA PLACEMENT Right 03/15/2020   Procedure: INSERTION OF ARTERIOVENOUS (AV) GORE-TEX GRAFT ARM ( BRACHIAL AXILLARY );  Surgeon: Katha Cabal, MD;  Location: [facility]  ORS;  Service: Vascular;  Laterality: Right;  . BACK SURGERY    . CERVICAL FUSION    . CHOLECYSTECTOMY  12/02/2012   Procedure: LAPAROSCOPIC CHOLECYSTECTOMY WITH INTRAOPERATIVE CHOLANGIOGRAM;  Surgeon: Edward Jolly, MD;  Location: [place];  Service: General;  Laterality: N/A;  . EYE SURGERY Bilateral    cataract surgery  . EYE SURGERY Left 2019   laser  . HEMATOMA EVACUATION Left 12/24/2016   Procedure: EVACUATION HEMATOMA LEFT UPPER ARM;  Surgeon: Waynetta Sandy, MD;  Location: [place];  Service: Vascular;  Laterality: Left;  . I & D EXTREMITY Left 12/31/2016   Procedure: IRRIGATION AND DEBRIDEMENT EXTREMITY;  Surgeon: Angelia Mould, MD;  Location: [place];  Service: Vascular;  Laterality: Left;  . INSERTION OF DIALYSIS CATHETER Right 12/24/2016   Procedure: INSERTION OF DIALYSIS CATHETER;  Surgeon: Waynetta Sandy, MD;  Location: [place];  Service: Vascular;  Laterality: Right;  . INSERTION OF DIALYSIS CATHETER Right 02/04/2017   Procedure: INSERTION OF DIALYSIS CATHETER;  Surgeon: Waynetta Sandy, MD;  Location: [place];  Service: Vascular;  Laterality: Right;  . KIDNEY TRANSPLANT  1996  . PERIPHERAL VASCULAR CATHETERIZATION Left 10/23/2016   Procedure: Fistulagram;  Surgeon: Elam Dutch, MD;  Location: [place] CV LAB;  Service: Cardiovascular;  Laterality: Left;  . PERIPHERAL VASCULAR THROMBECTOMY Right 04/16/2020   Procedure: PERIPHERAL VASCULAR THROMBECTOMY;  Surgeon: Delana Meyer,  Dolores Lory, MD;  Location: [place] CV LAB;  Service: Cardiovascular;  Laterality: Right;  . RESECTION OF ARTERIOVENOUS FISTULA ANEURYSM Left 11/22/2015   Procedure: RESECTION OF LEFT RADIOCEPHALIC FISTULA ANEURYSM ;  Surgeon: Serafina Mitchell, MD;  Location: [place];  Service: Vascular;  Laterality: Left;  . REVISON OF ARTERIOVENOUS FISTULA Left 12/22/2016   Procedure: REVISON OF LEFT ARTERIOVENOUS FISTULA;  Surgeon: Waynetta Sandy, MD;  Location: [place];  Service:  Vascular;  Laterality: Left;  . REVISON OF ARTERIOVENOUS FISTULA Left 02/04/2017   Procedure: REVISON OF LEFT UPPER ARM ARTERIOVENOUS FISTULA;  Surgeon: Waynetta Sandy, MD;  Location: [place];  Service: Vascular;  Laterality: Left;  . UPPER EXTREMITY ANGIOGRAPHY Bilateral 09/19/2019   Procedure: UPPER EXTREMITY ANGIOGRAPHY;  Surgeon: Katha Cabal, MD;  Location: [place] CV LAB;  Service: Cardiovascular;  Laterality: Bilateral;   HPI:   69 y.o. 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 CXR on 07/21/20 indicated Cardiomegaly with suspected mild interstitial edema and small left pleural effusion; BSE initiated d/t recent coughing during consumption of thin liquids per nursing staff.  Assessment / Plan / Recommendation Clinical Impression  Pt presents with pharyngoesophageal dysphagia characterized by frequent belching, multiple swallows and c/o "pain in her stomach" after intake; no s/s of aspiration noted throughout evaluation with thin via straw, puree and solid consistencies observed with min verbal cues provided during consumption.  Pt stated she has a hx of GERD, but is currently not on medications.  ST recommends initiating a regular/thin liquid diet (renal, limited volume) with intermittent supervision for compensatory strategies including small bites/sips, upright 90 degrees during consumption and 30-45 min after consumption and esophageal precautions; GI consult may be considered d/t pt c/o esophageall symptoms during BSE paired with hx of GERD.  ST will f/u for dysphagia management/tx and diet tolerance.  Thank you for this consult. SLP Visit Diagnosis: Dysphagia, pharyngoesophageal phase (R13.14)    Aspiration Risk  Mild aspiration risk;Moderate aspiration risk    Diet Recommendation   Regular/thin liquids  Medication Administration: Other (Comment) (whole meds with liquids as tolerated and/or in puree)    Other   Recommendations Recommended Consults: Consider GI evaluation;Consider esophageal assessment Oral Care Recommendations: Oral care BID   Follow up Recommendations None      Frequency and Duration min 1 x/week  1 week       Prognosis Prognosis for Safe Diet Advancement: Good      Swallow Study   General Date of Onset: 07/21/20 HPI:  69 y.o. 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 Type of Study: Bedside Swallow Evaluation Previous Swallow Assessment: n/a Diet Prior to this Study: NPO Temperature Spikes Noted: No Respiratory Status: Nasal cannula History of Recent Intubation: No Behavior/Cognition: Alert;Cooperative Oral Cavity Assessment: Within Functional Limits Oral Care Completed by SLP: No (Pt completed this am) Oral Cavity - Dentition: Adequate natural dentition Vision: Functional for self-feeding Self-Feeding Abilities: Able to feed self;Needs assist Patient Positioning: Upright in chair Baseline Vocal Quality: Normal Volitional Cough: Strong Volitional Swallow: Able to elicit    Oral/Motor/Sensory Function Overall Oral Motor/Sensory Function: Within functional limits   Ice Chips Ice chips: Not tested   Thin Liquid Thin Liquid: Within functional limits Presentation: Straw Other Comments:  (Pt stated she "coughs sometimes")    Nectar Thick Nectar Thick Liquid: Not tested   Honey Thick Honey Thick Liquid: Not tested  Puree Puree: Impaired Presentation: Self Fed Pharyngeal Phase Impairments: Other (comments) Other Comments: belching frequently post-swallow   Solid     Solid: Impaired Presentation: Self Fed Pharyngeal Phase Impairments: Other (comments) Other Comments: frequent belching after intake      Elvina Sidle, M.S., CCC-SLP 07/22/2020,11:52 AM

## 2020-07-22 NOTE — Evaluation (Signed)
Physical Therapy Evaluation Patient Details Name: [name] MRN: [id number] DOB: 01/05/1951 Today's Date: 07/22/2020   History of Present Illness  68 y.o. female presenting with n/v/d, SOB, generalized weakness, and cough x24hr. Concern for volume overload 2/2 ESRD. PMHx significant for CAD, CHF, depression/anxiety, ESRD on HD MWF, HLD, HTN, neuropathy, and Hx of CVA.  Clinical Impression  Pt admitted with above diagnosis. Pt was able to ambulate only 5 feet with RW with min assist  and had to sit down on bed as she said she felt weak. Pt sats would not register and she was slightly SOB therefore placed O2 on and then sats 93% on 2L.  Pt walked back to chair and left pt in chair. Very poor endurance and does not have 24 hour care at home. Will need SNF on d/c.  Pt currently with functional limitations due to the deficits listed below (see PT Problem List). Pt will benefit from skilled PT to increase their independence and safety with mobility to allow discharge to the venue listed below.      Follow Up Recommendations SNF;Supervision/Assistance - 24 hour    Equipment Recommendations  None recommended by PT    Recommendations for Other Services       Precautions / Restrictions Precautions Precautions: Fall Restrictions Weight Bearing Restrictions: No      Mobility  Bed Mobility Overal bed mobility: Needs Assistance Bed Mobility: Supine to Sit     Supine to sit: Total assist (2/2 lethargy )     General bed mobility comments: In chair on arrival  Transfers Overall transfer level: Needs assistance Equipment used: Rolling walker (2 wheeled) Transfers: Sit to/from [employer] Sit to Stand: Min assist Stand pivot transfers: Mod assist       General transfer comment: Min A for sit to stand from recliner with cues for hand placement as pt likes to try to pull on RW.  Steadying assist once up as well.   Ambulation/Gait Ambulation/Gait assistance: Min  assist;Mod assist;+2 physical assistance Gait Distance (Feet): 5 Feet Assistive device: Rolling walker (2 wheeled) Gait Pattern/deviations: Decreased step length - right;Decreased step length - left;Shuffle;Drifts right/left;Trunk flexed   Gait velocity interpretation: <1.31 ft/sec, indicative of household ambulator General Gait Details: Pt took a few steps with cues to stay close to RW.  Pt walked 5 feet and just sat abruptly at end of bed as she states "I cant breathe well."  Checked sats and they would not register and pt was on RA.  REplaced 2LO2 and sats 93%.  left O2 in place.  Pt was able to walk back to chair to sit longer but states " i am just too weak to walk."   Stairs            Wheelchair Mobility    Modified Rankin (Stroke Patients Only)       Balance Overall balance assessment: Needs assistance Sitting-balance support: Bilateral upper extremity supported;Feet supported Sitting balance-Leahy Scale: Poor     Standing balance support: Bilateral upper extremity supported Standing balance-Leahy Scale: Poor Standing balance comment: BUE support on RW and external assist                              Pertinent Vitals/Pain Pain Assessment: No/denies pain    Home Living Family/patient expects to be discharged to:: Private residence Living Arrangements: Parent (Mother who is ~83 y.o.) Available Help at Discharge: Family;Other (Comment) (Sister comes by  2-3x weekly, son comes by weekly. ) Type of Home: House Home Access: Stairs to enter Entrance Stairs-Rails: Left Entrance Stairs-Number of Steps: 3 STE Home Layout: One level Home Equipment: Shower seat;Cane - single point;Walker - 2 wheels Additional Comments: Patient is a questionable historian. Son called during evaluation and confirmed home set-up and PLOF.     Prior Function Level of Independence: Independent with assistive device(s) (Recent assist with BADLs/IADLs and mobility w/ RW)          Comments: Patient receives meals on wheels. Able to bathe seated on shower chair and dress herself with Mod I and use of RW>SPC. SCAT for transportation to dialysis. Son reports recent decline in his mothers function.       Hand Dominance   Dominant Hand: Right    Extremity/Trunk Assessment   Upper Extremity Assessment Upper Extremity Assessment: Defer to OT evaluation    Lower Extremity Assessment Lower Extremity Assessment: Generalized weakness    Cervical / Trunk Assessment Cervical / Trunk Assessment: Kyphotic  Communication   Communication: Expressive difficulties  Cognition Arousal/Alertness: Awake/alert Behavior During Therapy: Flat affect Overall Cognitive Status: Impaired/Different from baseline Area of Impairment: Orientation;Attention;Memory;Following commands;Safety/judgement;Awareness;Problem solving                 Orientation Level: Time Current Attention Level: Sustained Memory: Decreased short-term memory Following Commands: Follows multi-step commands inconsistently;Follows multi-step commands with increased time Safety/Judgement: Decreased awareness of safety;Decreased awareness of deficits Awareness: Emergent Problem Solving: Slow processing;Difficulty sequencing General Comments: Patient A&Ox3      General Comments General comments (skin integrity, edema, etc.): Vitals monitored throughout session. Patient initially on 2L O2 via [place] but able to maintain SpO2 100% on RA.     Exercises     Assessment/Plan    PT Assessment Patient needs continued PT services  PT Problem List Decreased balance;Decreased activity tolerance;Decreased mobility;Decreased knowledge of use of DME;Decreased safety awareness;Decreased knowledge of precautions;Cardiopulmonary status limiting activity       PT Treatment Interventions DME instruction;Gait training;Functional mobility training;Therapeutic activities;Therapeutic exercise;Balance training;Patient/family  education    PT Goals (Current goals can be found in the Care Plan section)  Acute Rehab PT Goals Patient Stated Goal: to get better PT Goal Formulation: With patient Time For Goal Achievement: 08/05/20 Potential to Achieve Goals: Good    Frequency Min 2X/week   Barriers to discharge Decreased caregiver support      Co-evaluation               AM-PAC PT "6 Clicks" Mobility  Outcome Measure Help needed turning from your back to your side while in a flat bed without using bedrails?: Total Help needed moving from lying on your back to sitting on the side of a flat bed without using bedrails?: Total Help needed moving to and from a bed to a chair (including a wheelchair)?: A Lot Help needed standing up from a chair using your arms (e.g., wheelchair or bedside chair)?: A Lot Help needed to walk in hospital room?: A Lot Help needed climbing 3-5 steps with a railing? : A Lot 6 Click Score: 10    End of Session Equipment Utilized During Treatment: Gait belt;Oxygen Activity Tolerance: Patient limited by fatigue Patient left: in chair;with call bell/phone within reach;with chair alarm set Nurse Communication: Mobility status PT Visit Diagnosis: Unsteadiness on feet (R26.81);Muscle weakness (generalized) (M62.81);Other abnormalities of gait and mobility (R26.89)    Time: 5427-0623 PT Time Calculation (min) (ACUTE ONLY): 18 min   Charges:   PT  Evaluation $PT Eval Moderate Complexity: 1 Mod          Neithan Day W,PT Acute Rehabilitation Services Pager:  [phone]  Office:  [phone]    Berline Lopes 07/22/2020, 10:34 AM

## 2020-07-22 NOTE — H&P (Addendum)
History and Physical    [name] UYQ:[id number] DOB: 1951-10-18 DOA: 07/21/2020  PCP: Renford Dills, MD  Patient coming from: Home  I have personally briefly reviewed patient's old medical records in [facility]  Chief Complaint: SOB  HPI: [name] is a 69 y.o. 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  Pt presented to ED with c/o SOB.  No missed HD sessions, last session on Friday.  Has associated non-productive cough, no CP.  Having diarrhea.   ED Course: ED work up suggestive of volume overload / CHF with pulm edema on imaging, BNP >4500.  Pt with h/o HFpEF - grade 2 DD, last echo was in June.  Pt underwent HD today.  SOB has resolved and patient feels better, but now has generalized weakness and too weak to walk even with walker post HD.  Hospitalist asked to admit.   Review of Systems: As per HPI, otherwise all review of systems negative.  Past Medical History:  Diagnosis Date  . Adenomatous polyp of colon 10/2010, 2006, 2015  . Anemia in CKD (chronic kidney disease) 11/07/2012   s/p blood transfusion.   . Arthritis   . CAD (coronary artery disease)    "something like that"  . CHF (congestive heart failure) (HCC)   . Constipation   . Depression with anxiety   . Diverticula, colon   . 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  Had some early rejection then stable function for years, then had slow decline of function and went back on hemodialysis in 2012.  Gets HD TTS schedule at [facility] on [facility] still using L forearm AVF.     Marland Kitchen GERD (gastroesophageal reflux disease)   . GI bleed 2017   felt to be ischemic colitis, last colo 2015  . Headache   . Hyperlipidemia   . Hypertension   . Neurologic gait dysfunction   . Neuromuscular disorder (HCC)    neuropathy hand and legs  . Osteoporosis   .  Pneumonia   . 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 Pulmonary edema 12/2019  . Stroke [facility]) 11/2015   TIA  . Weight loss, unintentional     Past Surgical History:  Procedure Laterality Date  . AV FISTULA PLACEMENT     for dialysis  . AV FISTULA PLACEMENT Left 11/22/2015   Procedure: ARTERIOVENOUS (AV) FISTULA CREATION-LEFT BRACHIOCEPHALIC;  Surgeon: Nada Libman, MD;  Location: [facility] OR;  Service: Vascular;  Laterality: Left;  . AV FISTULA PLACEMENT Right 03/15/2020   Procedure: INSERTION OF ARTERIOVENOUS (AV) GORE-TEX GRAFT ARM ( BRACHIAL AXILLARY );  Surgeon: Renford Dills, MD;  Location: [facility] ORS;  Service: Vascular;  Laterality: Right;  . BACK SURGERY    . CERVICAL FUSION    . CHOLECYSTECTOMY  12/02/2012   Procedure: LAPAROSCOPIC CHOLECYSTECTOMY WITH INTRAOPERATIVE CHOLANGIOGRAM;  Surgeon: Mariella Saa, MD;  Location: [facility] OR;  Service: General;  Laterality: N/A;  . EYE SURGERY Bilateral    cataract surgery  . EYE SURGERY Left 2019   laser  . HEMATOMA EVACUATION Left 12/24/2016   Procedure: EVACUATION HEMATOMA LEFT UPPER ARM;  Surgeon: Maeola Harman, MD;  Location: [facility] OR;  Service: Vascular;  Laterality: Left;  . I & D EXTREMITY Left 12/31/2016   Procedure: IRRIGATION AND DEBRIDEMENT EXTREMITY;  Surgeon:  Chuck Hint, MD;  Location: [facility] OR;  Service: Vascular;  Laterality: Left;  . INSERTION OF DIALYSIS CATHETER Right 12/24/2016   Procedure: INSERTION OF DIALYSIS CATHETER;  Surgeon: Maeola Harman, MD;  Location: [facility] OR;  Service: Vascular;  Laterality: Right;  . INSERTION OF DIALYSIS CATHETER Right 02/04/2017   Procedure: INSERTION OF DIALYSIS CATHETER;  Surgeon: Maeola Harman, MD;  Location: [facility] OR;  Service: Vascular;  Laterality: Right;  . KIDNEY TRANSPLANT  1996  . PERIPHERAL VASCULAR CATHETERIZATION Left 10/23/2016   Procedure: Fistulagram;  Surgeon: Sherren Kerns, MD;  Location: [facility] INVASIVE CV LAB;   Service: Cardiovascular;  Laterality: Left;  . PERIPHERAL VASCULAR THROMBECTOMY Right 04/16/2020   Procedure: PERIPHERAL VASCULAR THROMBECTOMY;  Surgeon: Renford Dills, MD;  Location: [facility] INVASIVE CV LAB;  Service: Cardiovascular;  Laterality: Right;  . RESECTION OF ARTERIOVENOUS FISTULA ANEURYSM Left 11/22/2015   Procedure: RESECTION OF LEFT RADIOCEPHALIC FISTULA ANEURYSM ;  Surgeon: Nada Libman, MD;  Location: [facility] OR;  Service: Vascular;  Laterality: Left;  . REVISON OF ARTERIOVENOUS FISTULA Left 12/22/2016   Procedure: REVISON OF LEFT ARTERIOVENOUS FISTULA;  Surgeon: Maeola Harman, MD;  Location: [facility] OR;  Service: Vascular;  Laterality: Left;  . REVISON OF ARTERIOVENOUS FISTULA Left 02/04/2017   Procedure: REVISON OF LEFT UPPER ARM ARTERIOVENOUS FISTULA;  Surgeon: Maeola Harman, MD;  Location[facility] OR;  Service: Vascular;  Laterality: Left;  . UPPER EXTREMITY ANGIOGRAPHY Bilateral 09/19/2019   Procedure: UPPER EXTREMITY ANGIOGRAPHY;  Surgeon: Renford Dills, MD;  Location: [facility] INVASIVE CV LAB;  Service: Cardiovascular;  Laterality: Bilateral;     reports that she quit smoking about 28 years ago. Her smoking use included cigarettes. She has never used smokeless tobacco. She reports that she does not drink alcohol and does not use drugs.  Allergies  Allergen Reactions  . Sulfa Antibiotics Other (See Comments)    Both parents allergic-so will not take  . Adhesive [Tape] Itching    Family History  Problem Relation Age of Onset  . Colon cancer Brother   . Cancer Brother   . Coronary artery disease Mother 68  . Hyperlipidemia Mother   . Hypertension Mother   . Stroke Maternal Aunt   . Esophageal cancer Neg Hx   . Stomach cancer Neg Hx   . Rectal cancer Neg Hx      Prior to Admission medications   Medication Sig Start Date End Date Taking? Authorizing Provider  acetaminophen (TYLENOL) 500 MG tablet Take 1 tablet (500 mg total) by mouth every 6 (six) hours  as needed. Patient taking differently: Take 500 mg by mouth every 6 (six) hours as needed for mild pain.  09/05/18  Yes Law, Waylan Boga, PA-C  allopurinol (ZYLOPRIM) 300 MG tablet Take 150 mg by mouth daily.    Yes [provider]  ALPRAZolam (XANAX) 0.25 MG tablet Take 0.25 mg by mouth daily as needed for anxiety.    Yes [provider]  aspirin EC 81 MG EC tablet Take 1 tablet (81 mg total) by mouth daily. 05/14/20  Yes Black, Lesle Chris, NP  atorvastatin (LIPITOR) 40 MG tablet Take 1 tablet (40 mg total) by mouth daily. 04/02/20 07/05/29 Yes Amin, Ankit Chirag, MD  AURYXIA 1 GM 210 MG(Fe) tablet Take 420 mg by mouth 3 (three) times daily with meals.    Yes [provider]  B Complex-C-Folic Acid (DIALYVITE 800) 0.8 MG WAFR Take 1 tablet by mouth daily.  10/25/18  Yes  [provider]  carvedilol (COREG) 6.25 MG tablet Take 1 tablet (6.25 mg total) by mouth 2 (two) times daily with a meal. 04/02/20 07/05/29 Yes Amin, Ankit Chirag, MD  cinacalcet (SENSIPAR) 30 MG tablet Take 30 mg by mouth daily. 01/04/20  Yes [provider]  labetalol (NORMODYNE) 200 MG tablet Take 200 mg by mouth daily. 04/14/20  Yes [provider]  lactulose (CHRONULAC) 10 GM/15ML solution Take 20 g by mouth daily as needed for mild constipation.   Yes [provider]  losartan (COZAAR) 50 MG tablet Take 50 mg by mouth daily. 04/23/20  Yes [provider]  ondansetron (ZOFRAN) 8 MG tablet Take 8 mg by mouth 2 (two) times daily as needed for nausea or vomiting.    Yes [provider]  zolpidem (AMBIEN) 10 MG tablet Take 10 mg by mouth at bedtime as needed for sleep.    Yes [provider]    Physical Exam: Vitals:   07/21/20 1947 07/21/20 2220 07/21/20 2332 07/22/20 0015  BP: 126/71 134/62 (!) 118/56 115/64  Pulse: 87 88 81 86  Resp:  19  19  Temp:  98.6 F (37 C)    TempSrc:      SpO2:  95% 97% 97%  Weight:      Height:         Constitutional: NAD, calm, comfortable Eyes: PERRL, lids and conjunctivae normal ENMT: Mucous membranes are moist. Posterior pharynx clear of any exudate or lesions.Normal dentition.  Neck: normal, supple, no masses, no thyromegaly Respiratory: clear to auscultation bilaterally, no wheezing, no crackles. Normal respiratory effort. No accessory muscle use.  Cardiovascular: Regular rate and rhythm, no murmurs / rubs / gallops. No extremity edema. 2+ pedal pulses. No carotid bruits.  Abdomen: no tenderness, no masses palpated. No hepatosplenomegaly. Bowel sounds positive.  Musculoskeletal: no clubbing / cyanosis. No joint deformity upper and lower extremities. Good ROM, no contractures. Normal muscle tone.  Skin: no rashes, lesions, ulcers. No induration Neurologic: CN 2-12 grossly intact. Sensation intact, DTR normal. Strength 5/5 in all 4.  Psychiatric: Normal judgment and insight. Alert and oriented x 3. Normal mood.    Labs on Admission: I have personally reviewed following labs and imaging studies  CBC: Recent Labs  Lab 07/21/20 0950 07/21/20 1119  WBC 7.4  --   HGB 8.8* 8.8*  HCT 27.7* 26.0*  MCV 93.9  --   PLT 204  --    Basic Metabolic Panel: Recent Labs  Lab 07/21/20 0950 07/21/20 1119  NA 141 140  K 3.3* 3.5  CL 96*  --   CO2 26  --   GLUCOSE 138*  --   BUN 23  --   CREATININE 6.10*  --   CALCIUM 10.4*  --    GFR: Estimated Creatinine Clearance: 7.5 mL/min (A) (by C-G formula based on SCr of 6.1 mg/dL (H)). Liver Function Tests: Recent Labs  Lab 07/21/20 0950  AST 33  ALT 17  ALKPHOS 91  BILITOT 1.2  PROT 6.8  ALBUMIN 3.4*   Recent Labs  Lab 07/21/20 0950  LIPASE 35   No results for input(s): AMMONIA in the last 168 hours. Coagulation Profile: No results for input(s): INR, PROTIME in the last 168 hours. Cardiac Enzymes: No results for input(s): CKTOTAL, CKMB, CKMBINDEX, TROPONINI in the last 168 hours. BNP (last 3 results) No results for  input(s): PROBNP in the last 8760 hours. HbA1C: No results for input(s): HGBA1C in the last 72 hours. CBG:  No results for input(s): GLUCAP in the last 168 hours. Lipid Profile: No results for input(s): CHOL, HDL, LDLCALC, TRIG, CHOLHDL, LDLDIRECT in the last 72 hours. Thyroid Function Tests: No results for input(s): TSH, T4TOTAL, FREET4, T3FREE, THYROIDAB in the last 72 hours. Anemia Panel: No results for input(s): VITAMINB12, FOLATE, FERRITIN, TIBC, IRON, RETICCTPCT in the last 72 hours. Urine analysis:    Component Value Date/Time   COLORURINE YELLOW 01/30/2011 1648   APPEARANCEUR CLOUDY (A) 01/30/2011 1648   LABSPEC 1.016 01/30/2011 1648   PHURINE 5.0 01/30/2011 1648   GLUCOSEU NEGATIVE 10/14/2009 0747   HGBUR SMALL (A) 01/30/2011 1648   BILIRUBINUR NEGATIVE 01/30/2011 1648   KETONESUR NEGATIVE 01/30/2011 1648   PROTEINUR 100 (A) 01/30/2011 1648   UROBILINOGEN 0.2 01/30/2011 1648   NITRITE NEGATIVE 01/30/2011 1648   LEUKOCYTESUR SMALL (A) 01/30/2011 1648    Radiological Exams on Admission: CT ABDOMEN PELVIS W CONTRAST  Result Date: 07/21/2020 CLINICAL DATA:  Epigastric abdominal pain. EXAM: CT ABDOMEN AND PELVIS WITH CONTRAST TECHNIQUE: Multidetector CT imaging of the abdomen and pelvis was performed using the standard protocol following bolus administration of intravenous contrast. CONTRAST:  OMNIPAQUE IOHEXOL 300 MG/ML  SOLN COMPARISON:  05/15/2016 CT abdomen/pelvis. FINDINGS: Lower chest: Interlobular septal thickening throughout both lung bases. Cardiomegaly. Small dependent bilateral pleural effusions with dependent bibasilar atelectasis. Coronary atherosclerosis. Hepatobiliary: Normal liver size. No liver mass. Cholecystectomy. No biliary ductal dilatation. Pancreas: Normal, with no mass or duct dilation. Spleen: Normal size. No mass. Adrenals/Urinary Tract: Normal adrenals. Severe bilateral renal atrophy. No hydronephrosis. Simple bilateral renal cysts, largest 5.0 cm in  interpolar left kidney. Normal collapsed bladder. Stomach/Bowel: Normal non-distended stomach. Normal caliber small bowel with no small bowel wall thickening. Normal appendix. Mild left colonic diverticulosis with no large bowel wall thickening or acute pericolonic fat stranding. Vascular/Lymphatic: Atherosclerotic nonaneurysmal abdominal aorta. Contrast reflux into the IVC and hepatic veins. Patent portal, splenic, hepatic and renal veins. No pathologically enlarged lymph nodes in the abdomen or pelvis. Reproductive: Stable mildly enlarged myomatous uterus. No adnexal masses. Other: No pneumoperitoneum. No ascites. Soft tissue density 5.0 x 2.7 cm structure in the anterolateral left pelvis (series 3/image 40) along the anterior margin of the left external iliac artery with scattered internal coarse calcifications, unchanged since 05/14/2016 CT, most compatible with a chronic hematoma. Musculoskeletal: No aggressive appearing focal osseous lesions. Severe chronic erosive change at the L4-5 disc, not significantly changed. Prosthesis between the L4 and L5 spinous processes. Renal osteodystrophy. IMPRESSION: 1. Cardiomegaly. Contrast reflux into the IVC and hepatic veins, suggesting right heart failure. Small dependent bilateral pleural effusions. Interlobular septal thickening throughout both lung bases, suggesting mild cardiogenic pulmonary edema. 2. No acute abnormality in the abdomen or pelvis. No evidence of bowel obstruction or acute bowel inflammation. Mild left colonic diverticulosis, with no evidence of acute diverticulitis. 3. Stable suspected chronic hematoma in the anterolateral left pelvis, see comments. 4. Stable mildly enlarged myomatous uterus. 5. Aortic Atherosclerosis (ICD10-I70.0). Electronically Signed   By: Delbert Phenix M.D.   On: 07/21/2020 13:34   DG Chest Port 1 View  Result Date: 07/21/2020 CLINICAL DATA:  Dyspnea EXAM: PORTABLE CHEST 1 VIEW COMPARISON:  07/05/2020 FINDINGS: Cardiomegaly  with increased interstitial markings in the right upper lobe and bilateral lower lobes, favoring interstitial edema over infection. Suspected trace left pleural effusion. No pneumothorax. Thoracic aortic atherosclerosis. IMPRESSION: Cardiomegaly with suspected mild interstitial edema and small left pleural effusion. Electronically Signed   By: Charline Bills M.D.   On: 07/21/2020  10:35    EKG: Independently reviewed.  Assessment/Plan Principal Problem:   Acute on chronic diastolic heart failure (HCC) Active Problems:   End-stage renal disease on hemodialysis (HCC)   HTN (hypertension)   Volume overload    1. Volume overload causing acute on chronic dCHF - 1. S/p HD today, improved breathing, but pt still has generalized weakness and unable to ambulate 2. PT/OT 3. Tele monitor 4. Looks like nephrology planning on further HD tomorrow per their consult note 2. ESRD - 1. See nephrology consult note 2. S/p HD today 3. Looks like more HD planned for tomorrow per note 3. HTN - 1. Cont home BP meds 2. Of note, looks like patient is on BOTH BID coreg AND once daily labetalol? 3. Will leave on the coreg for now, hold the labetalol for the moment.  DVT prophylaxis: Heparin [place] - see a history of A.Fib and CVA on her chart, but pt not on chronic anticoagulation it seems.  In NSR today. Code Status: Full Family Communication: No family in room Disposition Plan: TBD Consults called: nephrology Admission status: Place in 85    Fayrene Towner M. DO [facility]  How to contact the [facility] Attending or Consulting provider 7A - 7P or covering provider during after hours 7P -7A, for this patient?  1. Check the care team in [facility] and look for a) attending/consulting [facility] provider listed and b) the [facility] team listed 2. Log into [email or web address]  Amion Physician Scheduling and messaging for groups and whole hospitals  On call and physician scheduling software for group practices, residents,  hospitalists and other medical providers for call, clinic, rotation and shift schedules. On[employer] is a hospital-wide system for scheduling doctors and paging doctors on call. EasyPlot is for scientific plotting and data analysis.  [email or web address]  and use [place]'s universal password to access. If you do not have the password, please contact the hospital operator.  3. Locate the [facility] provider you are looking for under [facility] and page to a number that you can be directly reached. 4. If you still have difficulty reaching the provider, please page the [facility] (Director on Call) for the Hospitalists listed on amion for assistance.  07/22/2020, 12:20 AM

## 2020-07-22 NOTE — ED Notes (Signed)
Patient states she felt SOB; sats at 97% and patient did not appear to be in any distress; pt requested 02; 2 L 02 applied for comfort-Monique,RN

## 2020-07-22 NOTE — Progress Notes (Signed)
Took over patient from previous nurse at 12 noon.

## 2020-07-22 NOTE — Progress Notes (Signed)
[facility] Progress Note  Subjective: no new c/o, SOB resolved w/ HD yest  Vitals:   07/22/20 0518 07/22/20 0808 07/22/20 0841 07/22/20 1138  BP: (!) 131/45 (!) 137/37 91/70 105/82  Pulse: 85 85 89 87  Resp: 16  17   Temp: 97.9 F (36.6 C)  97.6 F (36.4 C) 98.1 F (36.7 C)  TempSrc: Oral  Oral Oral  SpO2: 100% 100% 98% 98%  Weight:      Height:        Exam: Genalert pleasant calm, frail No jvd or bruits Chest CTA bilat RRR no MRG, +transmitted bruit from L avf Abd soft ntnd no mass or ascites +bs Ext no edema Neuro is alert, Ox 3 , nf RUA AVG +bruit    CXR - IMPRESSION: Cardiomegaly with suspected mild interstitial edema and small left pleural effusion.    OP HD: MWF HD  4h 350/1.5  48.5kg  2/2 bath P4  RUA AVG Hep none  (L AVF working but not using anymore)   Hect 3 ug tiw   Mircera 30 mg q2wks, last 7/12   Assessment/ Plan: 1. SOB/ pulm edema/ vol overload - likely due to lean body wt loss in esrd patient.  Sig better after HD yest and under dry wt today by 2kg. Plan additional short HD today to get back on schedule and lower edw more.  2. ESRD - on HD MWF. See above. Using R arm AVG (L AVF still works but aneurysmal and no longer using). HD today as above.  3. 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 HTN - cont coreg, will dc losartan given low BP's 5. Gout - allopurinol 6. MBD ckd - cont sensipar, binder, hect IV w hd 7. Anemia ckd - Hb 8.8, last esa 7/12 , was on q4wks, now on a 2wks. No need for esa here, will resume most likely at OP HD later this week.  8. Dispo - after HD today pt will be ok for dc from renal standpoint. Pt asking today about going to rehab facility, will defer to primary team.      Kelly Splinter 07/22/2020, 1:07 PM   Recent Labs  Lab 07/21/20 0950 07/21/20 0950 07/21/20 1119 07/22/20 0615  K 3.3*   < > 3.5 4.0  BUN 23  --   --  13  CREATININE 6.10*  --   --  3.64*  CALCIUM 10.4*  --   --  10.5*  HGB 8.8*    < > 8.8* 8.6*   < > = values in this interval not displayed.   Inpatient medications: . allopurinol  150 mg Oral Daily  . aspirin EC  81 mg Oral Daily  . atorvastatin  40 mg Oral Daily  . carvedilol  6.25 mg Oral BID WC  . Chlorhexidine Gluconate Cloth  6 each Topical Q0600  . cinacalcet  30 mg Oral Q breakfast  . doxercalciferol  3 mcg Intravenous Q M,W,F-HD  . ferric citrate  420 mg Oral TID WC  . heparin  5,000 Units Subcutaneous Q8H  . losartan  50 mg Oral Daily  . multivitamin  1 tablet Oral Daily    acetaminophen **OR** acetaminophen, ALPRAZolam, lactulose, ondansetron **OR** ondansetron (ZOFRAN) IV, zolpidem

## 2020-07-22 NOTE — Progress Notes (Signed)
69 yo F presenting with dyspnea/volume overload. See H&P from this AM for full details. Status improved after HD. Had weakness after. Eval'd by PT/OT. They rec SNF. TOC consulted. Await options. Otherwise, continue as per H&P.   General: 69 y.o. female resting in chair in NAD Cardiovascular: RRR, +S1, S2, no m/g/r, equal pulses throughout Respiratory: CTABL, no w/r/r, normal WOB GI: BS+, NDNT, no masses noted, no organomegaly noted MSK: No e/c/c Neuro: Alert to name, follows commands Psyc: Appropriate interaction and affect, calm/cooperative

## 2020-07-22 NOTE — ED Notes (Signed)
RN attempted to call report; RN to call back; 

## 2020-07-22 NOTE — Evaluation (Signed)
Occupational Therapy Evaluation Patient Details Name: [name] MRN: [id number] DOB: Mar 12, 1951 Today's Date: 07/22/2020    History of Present Illness 69 y.o. female presenting with n/v/d, SOB, generalized weakness, and cough x24hr. Concern for volume overload 2/2 ESRD. PMHx significant for CAD, CHF, depression/anxiety, ESRD on HD MWF, HLD, HTN, neuropathy, and Hx of CVA.   Clinical Impression   PTA, patient was living with her elderly mother in a private residence and was independent with BADLs with occasional use of RW vs. SPC in community dwellings prior to recent decline. Patient currently presents below baseline level of function requiring Mod A grossly for BADLs, functional transfers, and mobility with use of RW. Patient also limited by deficits in static/dynamic sitting/standing balance, cognition, decreased activity tolerance, and generalized weakness. Patient would benefit from continued acute OT services to increase safety and independence with self-care tasks, decrease risk of falls in prep for safe return to PLOF. Recommendation for SNF rehab prior to d/c home with patient/family in agreement.     Follow Up Recommendations  SNF    Equipment Recommendations  Other (comment) (Defer to next level of care)    Recommendations for Other Services Speech consult     Precautions / Restrictions Precautions Precautions: Fall Restrictions Weight Bearing Restrictions: No      Mobility Bed Mobility Overal bed mobility: Needs Assistance Bed Mobility: Supine to Sit     Supine to sit: Total assist (2/2 lethargy )        Transfers Overall transfer level: Needs assistance Equipment used: Rolling walker (2 wheeled) Transfers: Sit to/from [employer] Sit to Stand: Min assist Stand pivot transfers: Mod assist       General transfer comment: Min A for STS from EOB in lowest setting and Mod A for SPT to [facility] with RW and heavy cues for proximity, hand  placement and step-through pattern.     Balance Overall balance assessment: Needs assistance Sitting-balance support: Bilateral upper extremity supported;Feet supported Sitting balance-Leahy Scale: Poor     Standing balance support: Bilateral upper extremity supported Standing balance-Leahy Scale: Poor Standing balance comment: BUE support on RW and external assist                            ADL either performed or assessed with clinical judgement   ADL Overall ADL's : Needs assistance/impaired Eating/Feeding: Set up;Sitting (Coughing with swallowing. RN notified. ) Eating/Feeding Details (indicate cue type and reason): Patient able to spear items on meal tray and bring to mouth after set-up. Coughing/choking with swallowing. Meal tray removed and RN made aware of need for SLP consult.  Grooming: Supervision/safety;Sitting Grooming Details (indicate cue type and reason): Able to wash face seated EOB with supervision A and cues for initiation 2/2 lethargy         Upper Body Dressing : Minimal assistance;Sitting Upper Body Dressing Details (indicate cue type and reason): To don posterior hospital gown seated EOB.  Lower Body Dressing: Maximal assistance;Sit to/from stand;Sitting/lateral leans Lower Body Dressing Details (indicate cue type and reason): To doff/don footwear seated EOB.  Toilet Transfer: Moderate assistance;BSC;RW Toilet Transfer Details (indicate cue type and reason): Mod A for stand-pivot transfer to [facility] with cues for proximity, hand placement and step-through pattern. Frequent LOB requiring Mod A to correct. Toileting- Clothing Manipulation and Hygiene: Moderate assistance;Sit to/from stand;Sitting/lateral lean Toileting - Clothing Manipulation Details (indicate cue type and reason): Mod A for hygiene/clothing management in sitting/standing 2/2 decreased balance  Functional mobility during ADLs: Moderate assistance;Rolling walker General ADL Comments:  Patient initially very lethargic with inability to keep eyes open for more than a few seconds. Patient perked up once seated EOB but continued to require cues for sequencing/safety and Mod A progressing to Min guard for static sitting at EOB/BSC.      Vision Baseline Vision/History: Wears glasses Wears Glasses: Reading only Vision Assessment?: No apparent visual deficits     Perception     Praxis      Pertinent Vitals/Pain Pain Assessment: No/denies pain     Hand Dominance Right   Extremity/Trunk Assessment Upper Extremity Assessment Upper Extremity Assessment: Generalized weakness   Lower Extremity Assessment Lower Extremity Assessment: Generalized weakness   Cervical / Trunk Assessment Cervical / Trunk Assessment: Kyphotic   Communication Communication Communication: Expressive difficulties   Cognition Arousal/Alertness: Lethargic Behavior During Therapy: Flat affect Overall Cognitive Status: Impaired/Different from baseline Area of Impairment: Orientation;Attention;Memory;Following commands;Safety/judgement;Awareness;Problem solving                 Orientation Level: Time Current Attention Level: Sustained Memory: Decreased short-term memory Following Commands: Follows multi-step commands inconsistently;Follows multi-step commands with increased time Safety/Judgement: Decreased awareness of safety;Decreased awareness of deficits Awareness: Emergent Problem Solving: Slow processing;Difficulty sequencing General Comments: Patient A&Ox3   General Comments  Vitals monitored throughout session. Patient initially on 2L O2 via [place] but able to maintain SpO2 100% on RA.     Exercises     Shoulder Instructions      Home Living Family/patient expects to be discharged to:: Private residence Living Arrangements: Parent (Mother who is ~42 y.o.) Available Help at Discharge: Family;Other (Comment) (Sister comes by 2-3x weekly, son comes by weekly. ) Type of Home:  House Home Access: Stairs to enter [employer] of Steps: 3 STE Entrance Stairs-Rails: Left Home Layout: One level     Bathroom Shower/Tub: Tub/shower unit;Walk-in shower   Bathroom Toilet: Standard Bathroom Accessibility: Yes How Accessible: Accessible via walker Home Equipment: Shower seat;Cane - single point;Walker - 2 wheels   Additional Comments: Patient is a questionable historian. Son called during evaluation and confirmed home set-up and PLOF.       Prior Functioning/Environment Level of Independence: Independent with assistive device(s) (Recent assist with BADLs/IADLs and mobility w/ RW)        Comments: Patient receives meals on wheels. Able to bathe seated on shower chair and dress herself with Mod I and use of RW>SPC. SCAT for transportation to dialysis. Son reports recent decline in his mothers function.          OT Problem List: Decreased strength;Decreased activity tolerance;Impaired balance (sitting and/or standing);Decreased coordination;Decreased cognition;Decreased safety awareness;Decreased knowledge of use of DME or AE      OT Treatment/Interventions: Self-care/ADL training;Therapeutic exercise;Energy conservation;DME and/or AE instruction;Therapeutic activities;Patient/family education;Balance training    OT Goals(Current goals can be found in the care plan section) Acute Rehab OT Goals Patient Stated Goal: Patient/family in agreement with SNF rehab.  OT Goal Formulation: Patient unable to participate in goal setting Time For Goal Achievement: 08/05/20 Potential to Achieve Goals: Good ADL Goals Pt Will Perform Grooming: with modified independence;standing Pt Will Perform Lower Body Bathing: with modified independence;sit to/from stand;sitting/lateral leans Pt Will Perform Lower Body Dressing: with modified independence;sitting/lateral leans;sit to/from stand Pt Will Transfer to Toilet: with modified independence;ambulating Pt Will Perform  Toileting - Clothing Manipulation and hygiene: with modified independence;sitting/lateral leans;sit to/from stand Pt/caregiver will Perform Home Exercise Program: Both right and left upper extremity;Increased strength;With written  HEP provided  OT Frequency: Min 2X/week   Barriers to D/C:            Co-evaluation              AM-PAC OT "6 Clicks" Daily Activity     Outcome Measure Help from another person eating meals?: Total Help from another person taking care of personal grooming?: A Little Help from another person toileting, which includes using toliet, bedpan, or urinal?: A Lot Help from another person bathing (including washing, rinsing, drying)?: A Lot Help from another person to put on and taking off regular upper body clothing?: A Little Help from another person to put on and taking off regular lower body clothing?: A Lot 6 Click Score: 13   End of Session Equipment Utilized During Treatment: Gait belt;Rolling walker Nurse Communication: Mobility status;Other (comment) (SLP consult)  Activity Tolerance: Patient tolerated treatment well Patient left: in chair;with call bell/phone within reach;with chair alarm set  OT Visit Diagnosis: Unsteadiness on feet (R26.81);Other abnormalities of gait and mobility (R26.89);Repeated falls (R29.6);Muscle weakness (generalized) (M62.81)                Time: 1324-4010 OT Time Calculation (min): 45 min Charges:  OT General Charges $OT Visit: 1 Visit OT Evaluation $OT Eval Moderate Complexity: 1 Mod OT Treatments $Self Care/Home Management : 23-37 mins  Holdyn Poyser H. OTR/L Supplemental OT, Department of rehab services [phone]  Tora Prunty R H. 07/22/2020, 9:14 AM

## 2020-07-23 DIAGNOSIS — E8779 Other fluid overload: Secondary | ICD-10-CM | POA: Diagnosis not present

## 2020-07-23 DIAGNOSIS — N186 End stage renal disease: Secondary | ICD-10-CM | POA: Diagnosis not present

## 2020-07-23 DIAGNOSIS — I151 Hypertension secondary to other renal disorders: Secondary | ICD-10-CM | POA: Diagnosis not present

## 2020-07-23 DIAGNOSIS — I5033 Acute on chronic diastolic (congestive) heart failure: Secondary | ICD-10-CM | POA: Diagnosis not present

## 2020-07-23 LAB — MRSA PCR SCREENING: MRSA by PCR: NEGATIVE

## 2020-07-23 MED ORDER — SODIUM CHLORIDE 0.9 % IV BOLUS
250.0000 mL | Freq: Once | INTRAVENOUS | Status: AC
  Administered 2020-07-23: 250 mL via INTRAVENOUS

## 2020-07-23 NOTE — Progress Notes (Signed)
   07/23/20 1957  Assess: MEWS Score  Temp 97.7 F (36.5 C)  BP (!) 77/51  Pulse Rate 82  Resp 18  SpO2 92 %  Assess: MEWS Score  MEWS Temp 0  MEWS Systolic 2  MEWS Pulse 0  MEWS RR 0  MEWS LOC 0  MEWS Score 2  MEWS Score Color Yellow  Assess: if the MEWS score is Yellow or Red  Were vital signs taken at a resting state? Yes  Focused Assessment No change from prior assessment  Early Detection of Sepsis Score *See Row Information* Low  MEWS guidelines implemented *See Row Information* Yes  Treat  MEWS Interventions Escalated (See documentation below)  Pain Scale 0-10  Pain Score 0  Take Vital Signs  Increase Vital Sign Frequency  Yellow: Q 2hr X 2 then Q 4hr X 2, if remains yellow, continue Q 4hrs  Escalate  MEWS: Escalate Yellow: discuss with charge nurse/RN and consider discussing with provider and RRT  Notify: Charge Nurse/RN  Name of Charge Nurse/RN Notified [place] RN  Date Charge Nurse/RN Notified 07/23/20  Time Charge Nurse/RN Notified 2004  Notify: Provider  Provider Name/Title [place]  Date Provider Notified 07/23/20  Time Provider Notified 2005  Notification Type Page  Notification Reason Other (Comment)  Response Other (Comment) (Awaitng response)  Dr Marlowe Sax notified. Orders received to give 250cc bolus and continue to monitor patient.

## 2020-07-23 NOTE — Progress Notes (Signed)
[facility] Progress Note  Subjective: no new c/o, seen in room, no further SOB  Vitals:   07/23/20 0020 07/23/20 0022 07/23/20 0415 07/23/20 0851  BP: (!) 89/77 (!) 89/76 (!) 92/53 132/65  Pulse: 100 89 96 90  Resp: 20 16 18 18   Temp: 98.8 F (37.1 C) 98.8 F (37.1 C) 97.8 F (36.6 C) 97.6 F (36.4 C)  TempSrc: Oral  Oral Oral  SpO2:   91% 95%  Weight: 44.9 kg     Height:        Exam: Genalert pleasant calm, frail No jvd or bruits Chest CTA bilat RRR no MRG, +transmitted bruit from L avf Abd soft ntnd no mass or ascites +bs Ext no edema Neuro is alert, Ox 3 , nf RUA AVG +bruit    CXR - IMPRESSION: Cardiomegaly with suspected mild interstitial edema and small left pleural effusion.    OP HD: MWF HD  4h 350/1.5  48.5kg  2/2 bath P4  RUA AVG Hep none  (L AVF working but not using anymore)   Hect 3 ug tiw   Mircera 30 mg q2wks, last 7/12   Assessment/ Plan: 1. SOB/ pulm edema/ vol overload - likely due to lean body wt loss in esrd patient.  Wt down to ~45kg (was 48.5) and breathing much better. BP's soft and doubt has much more volume to get. Now pt is in need of SNF placement.  2. ESRD - on HD MWF. See above. Using R arm AVG (L AVF still works but aneurysmal and no longer using). HD Wed.  3. 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 HTN - BP's soft, on coreg w/ hold orders  5. Gout - allopurinol 6. MBD ckd - cont sensipar, binder, hect IV w hd 7. Anemia ckd - Hb 8.8, last esa 7/12 , was on q4wks, now on a 2wks. No need for esa here, will resume most likely at OP HD later this week.  8. Dispo - seen by PT, SNF recommended.      Rob Hilda Wexler 07/23/2020, 10:38 AM   Recent Labs  Lab 07/21/20 0950 07/21/20 0950 07/21/20 1119 07/22/20 0615  K 3.3*   < > 3.5 4.0  BUN 23  --   --  13  CREATININE 6.10*  --   --  3.64*  CALCIUM 10.4*  --   --  10.5*  HGB 8.8*   < > 8.8* 8.6*   < > = values in this interval not displayed.   Inpatient  medications: . allopurinol  150 mg Oral Daily  . aspirin EC  81 mg Oral Daily  . atorvastatin  40 mg Oral Daily  . carvedilol  6.25 mg Oral BID WC  . Chlorhexidine Gluconate Cloth  6 each Topical Q0600  . cinacalcet  30 mg Oral Q breakfast  . doxercalciferol  3 mcg Intravenous Q M,W,F-HD  . ferric citrate  420 mg Oral TID WC  . heparin  5,000 Units Subcutaneous Q8H  . losartan  50 mg Oral Daily  . multivitamin  1 tablet Oral Daily    acetaminophen **OR** acetaminophen, ALPRAZolam, lactulose, ondansetron **OR** ondansetron (ZOFRAN) IV, zolpidem

## 2020-07-23 NOTE — Progress Notes (Signed)
PROGRESS NOTE    [name]  FGH:[id number] DOB: Jul 07, 1951 DOA: 07/21/2020 PCP: Kathryn Carol, MD   Brief Narrative:   [name] is a 69 y.o. female with medical history significant of ESRD on HD, failed renal tx 96-2012, prior stroke, HTN.Pt presented to ED with c/o SOB.  No missed HD sessions, last session on Friday.  Has associated non-productive cough, no CP. Having diarrhea.  8/10: Denies complaints today. Says her resp status is good. No ab pain/diarrhea. She is awaiting SNF placement.   Assessment & Plan: Acute on chronic HFpEF Dyspnea Volume Overload     - improved on HD     - dyspnea resolved     - last echo 05/14/20: Left ventricular ejection fraction, by estimation, is 55 to 60%. The left ventricle has normal function. The left ventricle demonstrates regional wall motion abnormalities (see scoring diagram/findings for description). There is moderate concentric left ventricular hypertrophy. Left ventricular diastolic parameters are consistent with Grade II diastolic dysfunction (pseudonormalization).     - denies CP   ESRD on HD     - per nephro  HTN     - coreg, cozaar  Generalized weakness     - eval'd by PT; rec SNF     - CM working on placement  Diarrhea     - resovled  DVT prophylaxis: heparin Code Status: FULL Family Communication: Attempted call to son, [name] at [phone], but received VM only.   Status is: Observation  The patient remains OBS appropriate and will d/c before 2 midnights.  Dispo: The patient is from: Home              Anticipated d/c is to: SNF              Anticipated d/c date is: 1 day              Patient currently is medically stable to d/c. Awaiting SNF placement.  Consultants:   Nephrology  ROS:  Denies CP, N, V, ab pain . Remainder ROS is negative for all not previously mentioned.  Subjective: "I want to change my dessert for lunch."  Objective: Vitals:   07/23/20 0022 07/23/20 0415 07/23/20 0851  07/23/20 1123  BP: (!) 89/76 (!) 92/53 132/65 (!) 102/49  Pulse: 89 96 90 98  Resp: 16 18 18 16   Temp: 98.8 F (37.1 C) 97.8 F (36.6 C) 97.6 F (36.4 C) 98.7 F (37.1 C)  TempSrc:  Oral Oral   SpO2:  91% 95% 94%  Weight:      Height:        Intake/Output Summary (Last 24 hours) at 07/23/2020 1435 Last data filed at 07/23/2020 1252 Gross per 24 hour  Intake 120 ml  Output 1155 ml  Net -1035 ml   Filed Weights   07/21/20 0955 07/22/20 0512 07/23/20 0020  Weight: 61.2 kg 46.3 kg 44.9 kg    Examination:  General: 69 y.o. female resting in bed in NAD Cardiovascular: RRR, +S1, S2, no m/g/rt Respiratory: CTABL, no w/r/r, normal WOB GI: BS+, NDNT, no masses noted, no organomegaly noted MSK: No e/c/c Neuro: A&O x 3, no focal deficits Psyc: Appropriate interaction and affect, calm/cooperative   Data Reviewed: I have personally reviewed following labs and imaging studies.  CBC: Recent Labs  Lab 07/21/20 0950 07/21/20 1119 07/22/20 0615  WBC 7.4  --  5.6  HGB 8.8* 8.8* 8.6*  HCT 27.7* 26.0* 27.0*  MCV 93.9  --  93.4  PLT 204  --  016   Basic Metabolic Panel: Recent Labs  Lab 07/21/20 0950 07/21/20 1119 07/22/20 0615  NA 141 140 137  K 3.3* 3.5 4.0  CL 96*  --  97*  CO2 26  --  26  GLUCOSE 138*  --  82  BUN 23  --  13  CREATININE 6.10*  --  3.64*  CALCIUM 10.4*  --  10.5*   GFR: Estimated Creatinine Clearance: 10.3 mL/min (A) (by C-G formula based on SCr of 3.64 mg/dL (H)). Liver Function Tests: Recent Labs  Lab 07/21/20 0950  AST 33  ALT 17  ALKPHOS 91  BILITOT 1.2  PROT 6.8  ALBUMIN 3.4*   Recent Labs  Lab 07/21/20 0950  LIPASE 35   No results for input(s): AMMONIA in the last 168 hours. Coagulation Profile: No results for input(s): INR, PROTIME in the last 168 hours. Cardiac Enzymes: No results for input(s): CKTOTAL, CKMB, CKMBINDEX, TROPONINI in the last 168 hours. BNP (last 3 results) No results for input(s): PROBNP in the last 8760  hours. HbA1C: No results for input(s): HGBA1C in the last 72 hours. CBG: No results for input(s): GLUCAP in the last 168 hours. Lipid Profile: No results for input(s): CHOL, HDL, LDLCALC, TRIG, CHOLHDL, LDLDIRECT in the last 72 hours. Thyroid Function Tests: No results for input(s): TSH, T4TOTAL, FREET4, T3FREE, THYROIDAB in the last 72 hours. Anemia Panel: No results for input(s): VITAMINB12, FOLATE, FERRITIN, TIBC, IRON, RETICCTPCT in the last 72 hours. Sepsis Labs: No results for input(s): PROCALCITON, LATICACIDVEN in the last 168 hours.  Recent Results (from the past 240 hour(s))  SARS Coronavirus 2 by RT PCR (hospital order, performed in [facility] hospital lab) Nasopharyngeal Nasopharyngeal Swab     Status: None   Collection Time: 07/21/20  1:19 PM   Specimen: Nasopharyngeal Swab  Result Value Ref Range Status   SARS Coronavirus 2 NEGATIVE NEGATIVE Final    Comment: (NOTE) SARS-CoV-2 target nucleic acids are NOT DETECTED.  The SARS-CoV-2 RNA is generally detectable in upper and lower respiratory specimens during the acute phase of infection. The lowest concentration of SARS-CoV-2 viral copies this assay can detect is 250 copies / mL. A negative result does not preclude SARS-CoV-2 infection and should not be used as the sole basis for treatment or other patient management decisions.  A negative result may occur with improper specimen collection / handling, submission of specimen other than nasopharyngeal swab, presence of viral mutation(s) within the areas targeted by this assay, and inadequate number of viral copies (<250 copies / mL). A negative result must be combined with clinical observations, patient history, and epidemiological information.  Fact Sheet for Patients:   StrictlyIdeas.no  Fact Sheet for Healthcare Providers: BankingDealers.co.za  This test is not yet approved or  cleared by the [place] FDA and has  been authorized for detection and/or diagnosis of SARS-CoV-2 by FDA under an Emergency Use Authorization (EUA).  This EUA will remain in effect (meaning this test can be used) for the duration of the COVID-19 declaration under Section 564(b)(1) of the Act, 21 U.S.C. section 360bbb-3(b)(1), unless the authorization is terminated or revoked sooner.  Performed at [facility], [place] [address] [place], [place] [address]   MRSA PCR Screening     Status: None   Collection Time: 07/23/20 10:57 AM   Specimen: Nasal Mucosa; Nasopharyngeal  Result Value Ref Range Status   MRSA by PCR NEGATIVE NEGATIVE Final    Comment:  The GeneXpert MRSA Assay (FDA approved for NASAL specimens only), is one component of a comprehensive MRSA colonization surveillance program. It is not intended to diagnose MRSA infection nor to guide or monitor treatment for MRSA infections. Performed at [facility], [place] [address], [place], [place] [address]       Radiology Studies: No results found.   Scheduled Meds:  allopurinol  150 mg Oral Daily   aspirin EC  81 mg Oral Daily   atorvastatin  40 mg Oral Daily   carvedilol  6.25 mg Oral BID WC   Chlorhexidine Gluconate Cloth  6 each Topical Q0600   cinacalcet  30 mg Oral Q breakfast   doxercalciferol  3 mcg Intravenous Q M,W,F-HD   ferric citrate  420 mg Oral TID WC   heparin  5,000 Units Subcutaneous Q8H   losartan  50 mg Oral Daily   multivitamin  1 tablet Oral Daily   Continuous Infusions:   LOS: 0 days    Time spent: 25 minutes spent in the coordination of care today.    Jonnie Finner, DO [facility]  If 7PM-7AM, please contact night-coverage [email or web address] 07/23/2020, 2:35 PM

## 2020-07-23 NOTE — Progress Notes (Signed)
   07/23/20 2039  Vitals  BP (!) 84/49  MAP (mmHg) (!) 60  BP Location Right Leg  BP Method Automatic  Patient Position (if appropriate) Lying  Pulse Rate 71  Resp 16  MEWS COLOR  MEWS Score Color Green  Oxygen Therapy  SpO2 99 %  O2 Device Room Air  MEWS Score  MEWS Temp 0  MEWS Systolic 1  MEWS Pulse 0  MEWS RR 0  MEWS LOC 0  MEWS Score 1  Bolus complete. Will continue to monitor patient.

## 2020-07-23 NOTE — TOC Initial Note (Addendum)
Transition of Care [facility]) - Initial/Assessment Note    Patient Details  Name: [name] MRN: [id number] Date of Birth: 25-Jan-1951  Transition of [facility]) CM/SW Contact:    Trula Ore, [place] Phone Number: 07/23/2020, 3:15 PM  Clinical Narrative:       Update: 3:35pm-CSW tried to start insurance authorization for patient. CSW called [employer] and [employer] said patient is managed by [place]. CSW called [employer] and they said they are not sure who patient is managed by so they need to send a request to have it reviewed. [employer] said they will call CSW back when they have answer to start insurance authorization.  Pending bed offers. CSW awaiting call back about insurance coverage to start insurance authorization.  CSW will continue to follow.             CSW spoke with patient at bedside. Patient agreed to SNF placement. Patient gave CSW permission to fax out initial referral to [place] area. Patient gave CSW permission to discuss her care with her daughter [name]. Patient has had both covid vaccines.  Pending bed offers. CSW will start insurance authorization for patient.  CSW will continue to follow.  Expected Discharge Plan: Skilled Nursing Facility Barriers to Discharge: Continued Medical Work up   Patient Goals and CMS Choice Patient states their goals for this hospitalization and ongoing recovery are:: SNF CMS Medicare.gov Compare Post Acute Care list provided to:: Patient Choice offered to / list presented to : Patient  Expected Discharge Plan and Services Expected Discharge Plan: [place]       Living arrangements for the past 2 months: Single Family Home                                      Prior Living Arrangements/Services Living arrangements for the past 2 months: Single Family Home Lives with:: Self, Adult Children Patient language and need for interpreter reviewed:: Yes Do you feel safe going back to the place where you live?: No    SNF  Need for Family Participation in Patient Care: Yes (Comment) Care giver support system in place?: Yes (comment)   Criminal Activity/Legal Involvement Pertinent to Current Situation/Hospitalization: No - Comment as needed  Activities of Daily Living Home Assistive Devices/Equipment: Walker (specify type) ADL Screening (condition at time of admission) Patient's cognitive ability adequate to safely complete daily activities?: Yes Is the patient deaf or have difficulty hearing?: No Does the patient have difficulty seeing, even when wearing glasses/contacts?: No Does the patient have difficulty concentrating, remembering, or making decisions?: No Patient able to express need for assistance with ADLs?: Yes Does the patient have difficulty dressing or bathing?: No Independently performs ADLs?: Yes (appropriate for developmental age) Does the patient have difficulty walking or climbing stairs?: No Weakness of Legs: Both Weakness of Arms/Hands: Both  Permission Sought/Granted Permission sought to share information with : Case Manager, Family Supports, Chartered certified accountant granted to share information with : Yes, Verbal Permission Granted  Share Information with NAME: [name]  Permission granted to share info w AGENCY: SNF  Permission granted to share info w Relationship: daughter  Permission granted to share info w Contact Information: [name] [phone]  Emotional Assessment Appearance:: Appears stated age Attitude/Demeanor/Rapport: Gracious Affect (typically observed): Calm Orientation: : Oriented to Self, Oriented to Place, Oriented to  Time, Oriented to Situation Alcohol / Substance Use: Not Applicable Psych Involvement: No (comment)  Admission diagnosis:  General weakness [R53.1] ESRD (end stage renal disease) ([place]) [N18.6] Volume overload [E87.70] Acute on chronic congestive heart failure, unspecified heart failure type [facility]) [I50.9] Patient Active  Problem List   Diagnosis Date Noted  . NSTEMI (non-ST elevated myocardial infarction) ([place]) 05/14/2020  . Volume overload 05/13/2020  . Hypotension after procedure 03/16/2020  . Chest pain 03/16/2020  . Change in mental state 03/15/2020  . Ventricular tachycardia ([place]) 03/15/2020  . Seizure ([place]) 03/15/2020  . Unresponsive episode 03/15/2020  . Pulmonary edema 01/01/2020  . Complication of vascular access for dialysis 12/21/2019  . Breakdown (mechanical) of surgically created arteriovenous fistula, initial encounter ([place]) 12/18/2019  . Weakness 10/11/2019  . 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 Dependence on renal dialysis ([place]) 07/24/2019  . Iron deficiency anemia, unspecified 06/09/2019  . Age-related osteoporosis without current pathological fracture 04/17/2019  . 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 Coagulation defect, unspecified ([place]) 04/17/2019  . Diarrhea, unspecified 04/17/2019  . Encounter for screening for depression 04/17/2019  . Headache, unspecified 04/17/2019  . 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 Pain, unspecified 04/17/2019  . Polyp of colon 04/17/2019  . Primary generalized (osteo)arthritis 04/17/2019  . Pruritus, unspecified 04/17/2019  . Pure hypercholesterolemia, unspecified 04/17/2019  . Secondary hyperparathyroidism of renal origin ([place]) 04/17/2019  . Gastro-esophageal reflux disease without esophagitis 04/17/2019  . Essential (primary) hypertension 04/17/2019  . Hyperlipidemia, unspecified 04/17/2019  . Transient cerebral ischemic attack, unspecified 04/17/2019  . Hypoxemia 12/13/2018  . Atrial fibrillation ([place]) 03/23/2017  . Prolonged Q-T interval on ECG 02/24/2017  . Hypokalemia 02/24/2017  . Acute ischemic stroke ([place]) 02/23/2017  . Malnutrition of moderate degree 12/24/2016  . Problem with dialysis access ([place]) 12/21/2016  . Acute ischemic colitis ([place]) 05/16/2016  . Colitis 05/15/2016  . Rectal  bleeding 05/15/2016  . Neurologic abnormality 11/19/2015  . Hyperkalemia 11/19/2015  . Anxiety 11/19/2015  . Insomnia 11/19/2015  . Gait instability   . Stroke-like symptom 11/16/2015  . Vestibular neuritis 11/16/2015  . Stroke (cerebrum) ([place]) 11/16/2015  . Dizziness 05/09/2015  . Ataxia 05/09/2015  . H/O: CVA (cerebrovascular accident) 05/09/2015  . Left facial numbness 05/09/2015  . Left leg numbness 05/09/2015  . Hyperlipidemia   . Acute on chronic diastolic heart failure ([place])   . Shortness of breath 04/01/2013  . HTN (hypertension) 04/01/2013  . Cholecystitis, acute 11/07/2012  . End-stage renal disease on hemodialysis ([place]) 11/07/2012  . Anemia in chronic kidney disease 11/07/2012   PCP:  [name], MD Pharmacy:   [facility] [address] [place] [address] Phone: [phone] Fax: [place] [place] [address] [place] [address] Phone: [phone] Fax: [phone]  [facility] [place], [place] [address] [place] [address] Phone: [phone] Fax: [phone]     Social Determinants of Health (SDOH) Interventions    Readmission Risk Interventions No flowsheet data found.

## 2020-07-23 NOTE — NC FL2 (Signed)
[place] LEVEL OF CARE SCREENING TOOL     IDENTIFICATION  Patient Name: [name] Birthdate: 15-May-1951 Sex: female Admission Date (Current Location): 07/21/2020  [facility] and [place] Number:  Herbalist and Address:  [facility] [place] [place] [address] [place], [place] [address]      Provider Number: [id number]  Attending Physician Name and Address:  Jonnie Finner, DO  Relative Name and Phone Number:       Current Level of Care: Hospital Recommended Level of Care: [place] Prior Approval Number:    Date Approved/Denied:   PASRR Number: [id number]  Discharge Plan: SNF    Current Diagnoses: Patient Active Problem List   Diagnosis Date Noted  . NSTEMI (non-ST elevated myocardial infarction) ([place]) 05/14/2020  . Volume overload 05/13/2020  . Hypotension after procedure 03/16/2020  . Chest pain 03/16/2020  . Change in mental state 03/15/2020  . Ventricular tachycardia ([place]) 03/15/2020  . Seizure ([place]) 03/15/2020  . Unresponsive episode 03/15/2020  . Pulmonary edema 01/01/2020  . Complication of vascular access for dialysis 12/21/2019  . Breakdown (mechanical) of surgically created arteriovenous fistula, initial encounter ([place]) 12/18/2019  . Weakness 10/11/2019  . 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 Dependence on renal dialysis ([place]) 07/24/2019  . Iron deficiency anemia, unspecified 06/09/2019  . Age-related osteoporosis without current pathological fracture 04/17/2019  . 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 Coagulation defect, unspecified ([place]) 04/17/2019  . Diarrhea, unspecified 04/17/2019  . Encounter for screening for depression 04/17/2019  . Headache, unspecified 04/17/2019  . 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 Pain, unspecified 04/17/2019  . Polyp of colon 04/17/2019  . Primary generalized (osteo)arthritis 04/17/2019  . Pruritus, unspecified  04/17/2019  . Pure hypercholesterolemia, unspecified 04/17/2019  . Secondary hyperparathyroidism of renal origin ([place]) 04/17/2019  . Gastro-esophageal reflux disease without esophagitis 04/17/2019  . Essential (primary) hypertension 04/17/2019  . Hyperlipidemia, unspecified 04/17/2019  . Transient cerebral ischemic attack, unspecified 04/17/2019  . Hypoxemia 12/13/2018  . Atrial fibrillation ([place]) 03/23/2017  . Prolonged Q-T interval on ECG 02/24/2017  . Hypokalemia 02/24/2017  . Acute ischemic stroke ([place]) 02/23/2017  . Malnutrition of moderate degree 12/24/2016  . Problem with dialysis access ([place]) 12/21/2016  . Acute ischemic colitis ([place]) 05/16/2016  . Colitis 05/15/2016  . Rectal bleeding 05/15/2016  . Neurologic abnormality 11/19/2015  . Hyperkalemia 11/19/2015  . Anxiety 11/19/2015  . Insomnia 11/19/2015  . Gait instability   . Stroke-like symptom 11/16/2015  . Vestibular neuritis 11/16/2015  . Stroke (cerebrum) ([place]) 11/16/2015  . Dizziness 05/09/2015  . Ataxia 05/09/2015  . H/O: CVA (cerebrovascular accident) 05/09/2015  . Left facial numbness 05/09/2015  . Left leg numbness 05/09/2015  . Hyperlipidemia   . Acute on chronic diastolic heart failure ([place])   . Shortness of breath 04/01/2013  . HTN (hypertension) 04/01/2013  . Cholecystitis, acute 11/07/2012  . End-stage renal disease on hemodialysis ([place]) 11/07/2012  . Anemia in chronic kidney disease 11/07/2012    Orientation RESPIRATION BLADDER Height & Weight     Self, Time, Situation, Place  Normal Continent Weight: 98 lb 15.8 oz (44.9 kg) Height:  5' 2.5" (158.8 cm)  BEHAVIORAL SYMPTOMS/MOOD NEUROLOGICAL BOWEL NUTRITION STATUS      Continent Diet (renal, fluid restriction to 1200 mL)  AMBULATORY STATUS COMMUNICATION OF NEEDS Skin   Extensive Assist Verbally Normal  Personal Care Assistance Level of Assistance  Bathing, Feeding, Dressing Bathing Assistance: Maximum  assistance Feeding assistance: Independent Dressing Assistance: Maximum assistance     Functional Limitations Info             SPECIAL CARE FACTORS FREQUENCY  PT (By licensed PT), OT (By licensed OT)     PT Frequency: 5x/wk OT Frequency: 5x/wk            Contractures Contractures Info: Not present    Additional Factors Info  Code Status, Allergies Code Status Info: full Allergies Info: Sulfa Antibiotics, Adhesive           Current Medications (07/23/2020):  This is the current hospital active medication list Current Facility-Administered Medications  Medication Dose Route Frequency Provider Last Rate Last Admin  . acetaminophen (TYLENOL) tablet 650 mg  650 mg Oral Q6H PRN Etta Quill, DO   650 mg at 07/22/20 0028   Or  . acetaminophen (TYLENOL) suppository 650 mg  650 mg Rectal Q6H PRN Etta Quill, DO      . allopurinol (ZYLOPRIM) tablet 150 mg  150 mg Oral Daily Jennette Kettle M, DO   150 mg at 07/23/20 0948  . ALPRAZolam Duanne Moron) tablet 0.25 mg  0.25 mg Oral Daily PRN Etta Quill, DO      . aspirin EC tablet 81 mg  81 mg Oral Daily Jennette Kettle M, DO   81 mg at 07/23/20 0948  . atorvastatin (LIPITOR) tablet 40 mg  40 mg Oral Daily Jennette Kettle M, DO   40 mg at 07/23/20 0948  . carvedilol (COREG) tablet 6.25 mg  6.25 mg Oral BID WC Jennette Kettle M, DO   6.25 mg at 07/23/20 0948  . Chlorhexidine Gluconate Cloth 2 % PADS 6 each  6 each Topical Q0600 Roney Jaffe, MD   6 each at 07/22/20 0530  . cinacalcet (SENSIPAR) tablet 30 mg  30 mg Oral Q breakfast Etta Quill, DO   30 mg at 07/23/20 3086  . doxercalciferol (HECTOROL) injection 3 mcg  3 mcg Intravenous Q M,W,F-HD Roney Jaffe, MD   3 mcg at 07/22/20 1613  . ferric citrate (AURYXIA) tablet 420 mg  420 mg Oral TID WC Jennette Kettle M, DO   420 mg at 07/23/20 1308  . heparin injection 5,000 Units  5,000 Units Subcutaneous Q8H Etta Quill, DO   5,000 Units at 07/22/20 0810  . lactulose  (CHRONULAC) 10 GM/15ML solution 20 g  20 g Oral Daily PRN Etta Quill, DO      . losartan (COZAAR) tablet 50 mg  50 mg Oral Daily Jennette Kettle M, DO   50 mg at 07/23/20 0948  . multivitamin (RENA-VIT) tablet 1 tablet  1 tablet Oral Daily Etta Quill, DO   1 tablet at 07/23/20 0948  . ondansetron (ZOFRAN) tablet 4 mg  4 mg Oral Q6H PRN Etta Quill, DO       Or  . ondansetron [facility]) injection 4 mg  4 mg Intravenous Q6H PRN Etta Quill, DO   4 mg at 07/22/20 1507  . zolpidem (AMBIEN) tablet 5 mg  5 mg Oral QHS PRN Etta Quill, DO   5 mg at 07/22/20 2053     Discharge Medications: Please see discharge summary for a list of discharge medications.  Relevant Imaging Results:  Relevant Lab Results:   Additional Information SS#: [id number]; HD MWF at [facility], [place]

## 2020-07-23 NOTE — Progress Notes (Signed)
Bolus Started    07/23/20 2022  Vitals  BP (!) 87/33  MAP (mmHg) (!) 49  BP Location Right Leg  BP Method Automatic  Patient Position (if appropriate) Lying  Pulse Rate 82  Pulse Rate Source Monitor  Resp 16  Level of Consciousness  Level of Consciousness Alert  MEWS COLOR  MEWS Score Color Green  Oxygen Therapy  SpO2 99 %  O2 Device Room Air  MEWS Score  MEWS Temp 0  MEWS Systolic 1  MEWS Pulse 0  MEWS RR 0  MEWS LOC 0  MEWS Score 1

## 2020-07-23 NOTE — Care Management Obs Status (Signed)
[place] NOTIFICATION   Patient Details  Name: [name] MRN: [id number] Date of Birth: 08-Mar-1951   Medicare Observation Status Notification Given:  Yes    Carles Collet, RN 07/23/2020, 10:19 AM

## 2020-07-24 DIAGNOSIS — E43 Unspecified severe protein-calorie malnutrition: Secondary | ICD-10-CM | POA: Diagnosis present

## 2020-07-24 DIAGNOSIS — N186 End stage renal disease: Secondary | ICD-10-CM | POA: Diagnosis present

## 2020-07-24 DIAGNOSIS — F329 Major depressive disorder, single episode, unspecified: Secondary | ICD-10-CM | POA: Diagnosis present

## 2020-07-24 DIAGNOSIS — Z8673 Personal history of transient ischemic attack (TIA), and cerebral infarction without residual deficits: Secondary | ICD-10-CM | POA: Diagnosis not present

## 2020-07-24 DIAGNOSIS — R531 Weakness: Secondary | ICD-10-CM | POA: Diagnosis not present

## 2020-07-24 DIAGNOSIS — Z940 Kidney transplant status: Secondary | ICD-10-CM | POA: Diagnosis not present

## 2020-07-24 DIAGNOSIS — M810 Age-related osteoporosis without current pathological fracture: Secondary | ICD-10-CM | POA: Diagnosis present

## 2020-07-24 DIAGNOSIS — Z86010 Personal history of colonic polyps: Secondary | ICD-10-CM | POA: Diagnosis not present

## 2020-07-24 DIAGNOSIS — E8770 Fluid overload, unspecified: Secondary | ICD-10-CM | POA: Diagnosis present

## 2020-07-24 DIAGNOSIS — Z681 Body mass index (BMI) 19 or less, adult: Secondary | ICD-10-CM | POA: Diagnosis not present

## 2020-07-24 DIAGNOSIS — E785 Hyperlipidemia, unspecified: Secondary | ICD-10-CM | POA: Diagnosis present

## 2020-07-24 DIAGNOSIS — M109 Gout, unspecified: Secondary | ICD-10-CM | POA: Diagnosis present

## 2020-07-24 DIAGNOSIS — F419 Anxiety disorder, unspecified: Secondary | ICD-10-CM | POA: Diagnosis present

## 2020-07-24 DIAGNOSIS — I2510 Atherosclerotic heart disease of native coronary artery without angina pectoris: Secondary | ICD-10-CM | POA: Diagnosis present

## 2020-07-24 DIAGNOSIS — R0602 Shortness of breath: Secondary | ICD-10-CM | POA: Diagnosis present

## 2020-07-24 DIAGNOSIS — G629 Polyneuropathy, unspecified: Secondary | ICD-10-CM | POA: Diagnosis present

## 2020-07-24 DIAGNOSIS — I5033 Acute on chronic diastolic (congestive) heart failure: Secondary | ICD-10-CM | POA: Diagnosis present

## 2020-07-24 DIAGNOSIS — M1990 Unspecified osteoarthritis, unspecified site: Secondary | ICD-10-CM | POA: Diagnosis present

## 2020-07-24 DIAGNOSIS — E860 Dehydration: Secondary | ICD-10-CM | POA: Diagnosis present

## 2020-07-24 DIAGNOSIS — K219 Gastro-esophageal reflux disease without esophagitis: Secondary | ICD-10-CM | POA: Diagnosis present

## 2020-07-24 DIAGNOSIS — I132 Hypertensive heart and chronic kidney disease with heart failure and with stage 5 chronic kidney disease, or end stage renal disease: Secondary | ICD-10-CM | POA: Diagnosis present

## 2020-07-24 DIAGNOSIS — D631 Anemia in chronic kidney disease: Secondary | ICD-10-CM | POA: Diagnosis present

## 2020-07-24 DIAGNOSIS — R269 Unspecified abnormalities of gait and mobility: Secondary | ICD-10-CM | POA: Diagnosis present

## 2020-07-24 DIAGNOSIS — Z20822 Contact with and (suspected) exposure to covid-19: Secondary | ICD-10-CM | POA: Diagnosis present

## 2020-07-24 DIAGNOSIS — Z992 Dependence on renal dialysis: Secondary | ICD-10-CM | POA: Diagnosis not present

## 2020-07-24 DIAGNOSIS — I959 Hypotension, unspecified: Secondary | ICD-10-CM | POA: Diagnosis not present

## 2020-07-24 LAB — RENAL FUNCTION PANEL
Albumin: 3.4 g/dL — ABNORMAL LOW (ref 3.5–5.0)
Anion gap: 15 (ref 5–15)
BUN: 25 mg/dL — ABNORMAL HIGH (ref 8–23)
CO2: 26 mmol/L (ref 22–32)
Calcium: 10.2 mg/dL (ref 8.9–10.3)
Chloride: 96 mmol/L — ABNORMAL LOW (ref 98–111)
Creatinine, Ser: 5.64 mg/dL — ABNORMAL HIGH (ref 0.44–1.00)
GFR calc Af Amer: 8 mL/min — ABNORMAL LOW (ref >60)
GFR calc non Af Amer: 7 mL/min — ABNORMAL LOW (ref >60)
Glucose, Bld: 98 mg/dL (ref 70–99)
Phosphorus: 2.8 mg/dL (ref 2.5–4.6)
Potassium: 4 mmol/L (ref 3.5–5.1)
Sodium: 137 mmol/L (ref 135–145)

## 2020-07-24 LAB — CBC WITH DIFFERENTIAL/PLATELET
Abs Immature Granulocytes: 0.04 10*3/uL (ref 0.00–0.07)
Basophils Absolute: 0.1 10*3/uL (ref 0.0–0.1)
Basophils Relative: 1 %
Eosinophils Absolute: 0.2 10*3/uL (ref 0.0–0.5)
Eosinophils Relative: 3 %
HCT: 32 % — ABNORMAL LOW (ref 36.0–46.0)
Hemoglobin: 10 g/dL — ABNORMAL LOW (ref 12.0–15.0)
Immature Granulocytes: 1 %
Lymphocytes Relative: 20 %
Lymphs Abs: 1.7 10*3/uL (ref 0.7–4.0)
MCH: 30.4 pg (ref 26.0–34.0)
MCHC: 31.3 g/dL (ref 30.0–36.0)
MCV: 97.3 fL (ref 80.0–100.0)
Monocytes Absolute: 1.1 10*3/uL — ABNORMAL HIGH (ref 0.1–1.0)
Monocytes Relative: 14 %
Neutro Abs: 5 10*3/uL (ref 1.7–7.7)
Neutrophils Relative %: 61 %
Platelets: 269 10*3/uL (ref 150–400)
RBC: 3.29 MIL/uL — ABNORMAL LOW (ref 3.87–5.11)
RDW: 15.9 % — ABNORMAL HIGH (ref 11.5–15.5)
WBC: 8.2 10*3/uL (ref 4.0–10.5)
nRBC: 0 % (ref 0.0–0.2)

## 2020-07-24 LAB — MAGNESIUM: Magnesium: 2.4 mg/dL (ref 1.7–2.4)

## 2020-07-24 MED ORDER — MIDODRINE HCL 5 MG PO TABS
5.0000 mg | ORAL_TABLET | Freq: Three times a day (TID) | ORAL | Status: DC
  Administered 2020-07-24 – 2020-07-27 (×10): 5 mg via ORAL
  Filled 2020-07-24 (×10): qty 1

## 2020-07-24 MED ORDER — ENOXAPARIN SODIUM 30 MG/0.3ML ~~LOC~~ SOLN
30.0000 mg | Freq: Every day | SUBCUTANEOUS | Status: DC
  Administered 2020-07-26 – 2020-07-27 (×2): 30 mg via SUBCUTANEOUS
  Filled 2020-07-24 (×2): qty 0.3

## 2020-07-24 MED ORDER — PANTOPRAZOLE SODIUM 40 MG PO TBEC
40.0000 mg | DELAYED_RELEASE_TABLET | Freq: Once | ORAL | Status: AC
  Administered 2020-07-24: 40 mg via ORAL
  Filled 2020-07-24: qty 1

## 2020-07-24 MED ORDER — DOXERCALCIFEROL 4 MCG/2ML IV SOLN
INTRAVENOUS | Status: AC
  Administered 2020-07-24: 3 ug via INTRAVENOUS
  Filled 2020-07-24: qty 2

## 2020-07-24 MED ORDER — ALUM & MAG HYDROXIDE-SIMETH 200-200-20 MG/5ML PO SUSP
15.0000 mL | Freq: Once | ORAL | Status: AC
  Administered 2020-07-24: 15 mL via ORAL
  Filled 2020-07-24: qty 30

## 2020-07-24 MED ORDER — SODIUM CHLORIDE 0.9 % IV BOLUS
500.0000 mL | Freq: Once | INTRAVENOUS | Status: AC
  Administered 2020-07-24: 500 mL via INTRAVENOUS

## 2020-07-24 NOTE — Progress Notes (Signed)
Mews score turned yellow with blood pressure at 1030. Vital signs rechecked and BP 91/70. Patient asymptomatic. Midodrine given as ordered. Will continue to monitor.

## 2020-07-24 NOTE — Progress Notes (Signed)
Physical Therapy Treatment Patient Details Name: [name] MRN: [id number] DOB: [birth year] Today's Date: 07/24/2020    History of Present Illness 69 y.o. female presenting with n/v/d, SOB, generalized weakness, and cough x24hr. Concern for volume overload 2/2 ESRD. PMHx significant for CAD, CHF, depression/anxiety, ESRD on HD MWF, HLD, HTN, neuropathy, and Hx of CVA.    PT Comments    Pt admitted with above diagnosis. Pt was able to progress ambulation a little today but still fatigues quickly and needs incr assist when she fatigues with pt trying to sit prematurely. No dizziness and BP stable.  Pt with poor safety awareness and needs constant min to mod assist at times.   Pt currently with functional limitations due to balance and endurance deficits. Pt will benefit from skilled PT to increase their independence and safety with mobility to allow discharge to the venue listed below.     Follow Up Recommendations  SNF;Supervision/Assistance - 24 hour     Equipment Recommendations  None recommended by PT    Recommendations for Other Services       Precautions / Restrictions Precautions Precautions: Fall Restrictions Weight Bearing Restrictions: No    Mobility  Bed Mobility Overal bed mobility: Needs Assistance             General bed mobility comments: In chair on arrival  Transfers Overall transfer level: Needs assistance Equipment used: Rolling walker (2 wheeled) Transfers: Sit to/from [employer] Sit to Stand: Min assist;Mod assist;+2 safety/equipment         General transfer comment: Mod A for sit to stand from recliner with cues for hand placement as pt likes to try to pull on RW.  Steadying assist once up as well.   Ambulation/Gait Ambulation/Gait assistance: Min assist;Mod assist;+2 physical assistance Gait Distance (Feet): 40 Feet (5 feet forward and back, 20 feet, 10 feet) Assistive device: Rolling walker (2 wheeled) Gait  Pattern/deviations: Decreased step length - right;Decreased step length - left;Shuffle;Drifts right/left;Trunk flexed   Gait velocity interpretation: <1.31 ft/sec, indicative of household ambulator General Gait Details: On arrival, pt had slid down a bit in chair therefore had to assist her up in chair first.  Pt c/o her bottom was itching therefore stood a few times to rub cream on pts bottom.  Pt took a few steps with cues to stay close to RW.  Pt walked 5 feet forward and back with incr assist to step backward.  Pt sits abruptly when she gets to a chair really before she is close enough even though cued to feel the chair with her legs.  Pt then walked over toward sink but as soon as she saw chair she went to sit too soon but PT assisted her into chair.  Dialysis came in to take pt therefore walked pt back to bed.     Stairs             Wheelchair Mobility    Modified Rankin (Stroke Patients Only)       Balance Overall balance assessment: Needs assistance Sitting-balance support: No upper extremity supported;Feet supported Sitting balance-Leahy Scale: Fair     Standing balance support: Bilateral upper extremity supported Standing balance-Leahy Scale: Poor Standing balance comment: BUE support on RW and external assist                             Cognition Arousal/Alertness: Awake/alert Behavior During Therapy: Flat affect Overall Cognitive Status: Impaired/Different from  baseline Area of Impairment: Orientation;Attention;Memory;Following commands;Safety/judgement;Awareness;Problem solving                 Orientation Level: Time Current Attention Level: Sustained Memory: Decreased short-term memory Following Commands: Follows multi-step commands inconsistently;Follows multi-step commands with increased time Safety/Judgement: Decreased awareness of safety;Decreased awareness of deficits Awareness: Emergent Problem Solving: Slow processing;Difficulty  sequencing        Exercises      General Comments General comments (skin integrity, edema, etc.): VSS      Pertinent Vitals/Pain Pain Assessment: No/denies pain    Home Living                      Prior Function            PT Goals (current goals can now be found in the care plan section) Acute Rehab PT Goals Patient Stated Goal: to get better Progress towards PT goals: Progressing toward goals    Frequency    Min 2X/week      PT Plan Current plan remains appropriate    Co-evaluation              AM-PAC PT "6 Clicks" Mobility   Outcome Measure  Help needed turning from your back to your side while in a flat bed without using bedrails?: Total Help needed moving from lying on your back to sitting on the side of a flat bed without using bedrails?: Total Help needed moving to and from a bed to a chair (including a wheelchair)?: A Lot Help needed standing up from a chair using your arms (e.g., wheelchair or bedside chair)?: A Lot Help needed to walk in hospital room?: A Lot Help needed climbing 3-5 steps with a railing? : A Lot 6 Click Score: 10    End of Session Equipment Utilized During Treatment: Gait belt Activity Tolerance: Patient limited by fatigue Patient left: with call bell/phone within reach;in bed;with bed alarm set Nurse Communication: Mobility status PT Visit Diagnosis: Unsteadiness on feet (R26.81);Muscle weakness (generalized) (M62.81);Other abnormalities of gait and mobility (R26.89)     Time: 9629-5284 PT Time Calculation (min) (ACUTE ONLY): 20 min  Charges:  $Gait Training: 8-22 mins                     Poonam Woehrle W,PT Acute Rehabilitation Services Pager:  [phone]  Office:  [phone]     Berline Lopes 07/24/2020, 1:16 PM

## 2020-07-24 NOTE — Progress Notes (Signed)
[facility] Progress Note  Subjective: BP's were low and coulnd't do early HD today, then pt felt better and brought back and BP's in 80's -90's so HD is in progress now. Pt w/o any new c/o's.   Vitals:   07/24/20 1237 07/24/20 1300 07/24/20 1330 07/24/20 1400  BP: (!) 95/42 (!) 100/33 125/83 (!) 138/40  Pulse: 76 63 66 68  Resp: (!) 21 13 14 12   Temp:      TempSrc:      SpO2:      Weight:      Height:        Exam: Genalert pleasant calm, frail No jvd or bruits Chest CTA bilat RRR no MRG, +transmitted bruit from L avf Abd soft ntnd no mass or ascites +bs Ext no edema Neuro is alert, Ox 3 , nf RUA AVG +bruit    CXR - IMPRESSION: Cardiomegaly with suspected mild interstitial edema and small left pleural effusion.    OP HD: MWF HD  4h 350/1.5  48.5kg  2/2 bath P4  RUA AVG Hep none  (L AVF working but not using anymore)   Hect 3 ug tiw   Mircera 30 mg q2wks, last 7/12   Assessment/ Plan: 1. SOB/ pulm edema/ vol overload - likely due to lean body wt loss in esrd patient.  Wt down 4-5 kg under prior dry wt. BP's low today, will not pull edw any lower 2. Hypotension - will bolus 500 cc given new low BPs' and will dc coreg for now.   3. ESRD - on HD MWF. See above. Using R arm AVG (L AVF still works but aneurysmal and no longer using). HD today, keeping even.  4. 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 Gout - allopurinol 6. MBD ckd - cont sensipar, binder, hect IV w hd 7. Anemia ckd - Hb 8.8, last esa 7/12 , was on q4wks, now on a 2wks. No need for esa here, will resume most likely at OP HD later this week.  8. Dispo - seen by PT, SNF placement is pending.       Rob Patrisia Faeth 07/24/2020, 2:08 PM   Recent Labs  Lab 07/22/20 0615 07/24/20 0642 07/24/20 0643  K 4.0  --  4.0  BUN 13  --  25*  CREATININE 3.64*  --  5.64*  CALCIUM 10.5*  --  10.2  PHOS  --   --  2.8  HGB 8.6* 10.0*  --    Inpatient medications:  allopurinol  150 mg Oral Daily    aspirin EC  81 mg Oral Daily   atorvastatin  40 mg Oral Daily   cinacalcet  30 mg Oral Q breakfast   doxercalciferol  3 mcg Intravenous Q M,W,F-HD   enoxaparin (LOVENOX) injection  30 mg Subcutaneous Daily   ferric citrate  420 mg Oral TID WC   midodrine  5 mg Oral TID WC   multivitamin  1 tablet Oral Daily    acetaminophen **OR** acetaminophen, ALPRAZolam, lactulose, ondansetron **OR** ondansetron (ZOFRAN) IV, zolpidem

## 2020-07-24 NOTE — Progress Notes (Signed)
Pt. bp on arrival to HD unit 65/39 pt. Asymptomatic HR 78 standing weight 43.7kg pre HD, which is below pt. Dry weight 44.5kg. pt also having diarrhea. Dr. Jonnie Finner paged and made aware orders to send pt. Back to unit maybe attempt HD later in day when bp is stable. Report given to primary nurse Ander Purpura

## 2020-07-24 NOTE — Progress Notes (Signed)
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 The left ventricle has normal function. The left ventricle demonstrates regional wall motion abnormalities (see scoring diagram/findings for description). There is moderate concentric left ventricular hypertrophy. Left ventricular diastolic parameters are consistent with Grade II diastolic dysfunction (pseudonormalization).     - denies CP at this time - See below, HD limited by profound hypotension  ESRD on HD     - per nephro -Noted to have profound hypotension with sbp in the 60's after HD -Have started midodrine -Discussed with Nephrologist, tolerated HD today, but did require bolus of IVF  HTN, now hypotension     - coreg, cozaar now on hold for hypotension overnight -See above, have started midodrine  Generalized weakness     - eval'd by PT; rec SNF     - Follow up per SW  Diarrhea     - noted to have resovled   DVT prophylaxis: Lovenox subq Code Status: Full Family Communication: Pt in room, family not at bedside  Status is: Observation  The patient will require care spanning > 2 midnights and should be moved to inpatient because: Hemodynamically unstable, Unsafe d/c plan and Inpatient level of care appropriate due to severity of illness  Dispo: The patient is from: Home              Anticipated d/c  is to: Home              Anticipated d/c date is: 3 days              Patient currently is not medically stable to d/c.       Consultants:   Nephrology  Procedures:     Antimicrobials: Anti-infectives (From admission, onward)   None       Subjective: Denies chest pain or sob this AM  Objective: Vitals:   07/24/20 1500 07/24/20 1530 07/24/20 1615 07/24/20 1643  BP: (!) 86/26 (!) 162/118 108/86 (!) 74/37  Pulse: 70 70 70 76  Resp:   12 15  Temp:   (!) 97.4 F (36.3 C) (!) 97.4 F (36.3 C)  TempSrc:   Oral Oral  SpO2:   99% 95%  Weight:   45 kg   Height:        Intake/Output Summary (Last 24 hours) at 07/24/2020 1704 Last data filed at 07/24/2020 1615 Gross per 24 hour  Intake 120 ml  Output -400 ml  Net 520 ml   Filed Weights   07/24/20 0103 07/24/20 1227 07/24/20 1615  Weight: 44.1 kg 43.7 kg 45 kg    Examination:  General exam: Appears calm and comfortable  Respiratory system: Clear to auscultation. Respiratory effort normal. Cardiovascular system: S1 & S2 heard, Regular Gastrointestinal system: Abdomen is nondistended, soft and nontender. No organomegaly or masses felt. Normal  bowel sounds heard. Central nervous system: Alert and oriented. No focal neurological deficits. Extremities: Symmetric 5 x 5 power. Skin: No rashes, lesions  Psychiatry: Judgement and insight appear normal. Mood & affect appropriate.   Data Reviewed: I have personally reviewed following labs and imaging studies  CBC: Recent Labs  Lab 07/21/20 0950 07/21/20 1119 07/22/20 0615 07/24/20 0642  WBC 7.4  --  5.6 8.2  NEUTROABS  --   --   --  5.0  HGB 8.8* 8.8* 8.6* 10.0*  HCT 27.7* 26.0* 27.0* 32.0*  MCV 93.9  --  93.4 97.3  PLT 204  --  212 974   Basic Metabolic Panel: Recent Labs  Lab 07/21/20 0950 07/21/20 1119 07/22/20 0615 07/24/20 0642 07/24/20 0643  NA 141 140 137  --  137  K 3.3* 3.5 4.0  --  4.0  CL 96*  --  97*  --  96*  CO2 26  --  26  --  26    GLUCOSE 138*  --  82  --  98  BUN 23  --  13  --  25*  CREATININE 6.10*  --  3.64*  --  5.64*  CALCIUM 10.4*  --  10.5*  --  10.2  MG  --   --   --  2.4  --   PHOS  --   --   --   --  2.8   GFR: Estimated Creatinine Clearance: 6.7 mL/min (A) (by C-G formula based on SCr of 5.64 mg/dL (H)). Liver Function Tests: Recent Labs  Lab 07/21/20 0950 07/24/20 0643  AST 33  --   ALT 17  --   ALKPHOS 91  --   BILITOT 1.2  --   PROT 6.8  --   ALBUMIN 3.4* 3.4*   Recent Labs  Lab 07/21/20 0950  LIPASE 35   No results for input(s): AMMONIA in the last 168 hours. Coagulation Profile: No results for input(s): INR, PROTIME in the last 168 hours. Cardiac Enzymes: No results for input(s): CKTOTAL, CKMB, CKMBINDEX, TROPONINI in the last 168 hours. BNP (last 3 results) No results for input(s): PROBNP in the last 8760 hours. HbA1C: No results for input(s): HGBA1C in the last 72 hours. CBG: No results for input(s): GLUCAP in the last 168 hours. Lipid Profile: No results for input(s): CHOL, HDL, LDLCALC, TRIG, CHOLHDL, LDLDIRECT in the last 72 hours. Thyroid Function Tests: No results for input(s): TSH, T4TOTAL, FREET4, T3FREE, THYROIDAB in the last 72 hours. Anemia Panel: No results for input(s): VITAMINB12, FOLATE, FERRITIN, TIBC, IRON, RETICCTPCT in the last 72 hours. Sepsis Labs: No results for input(s): PROCALCITON, LATICACIDVEN in the last 168 hours.  Recent Results (from the past 240 hour(s))  SARS Coronavirus 2 by RT PCR (hospital order, performed in [facility] hospital lab) Nasopharyngeal Nasopharyngeal Swab     Status: None   Collection Time: 07/21/20  1:19 PM   Specimen: Nasopharyngeal Swab  Result Value Ref Range Status   SARS Coronavirus 2 NEGATIVE NEGATIVE Final    Comment: (NOTE) SARS-CoV-2 target nucleic acids are NOT DETECTED.  The SARS-CoV-2 RNA is generally detectable in upper and lower respiratory specimens during the acute phase of infection. The  lowest concentration of SARS-CoV-2 viral copies this assay can detect is 250 copies / mL. A negative result does not preclude SARS-CoV-2 infection and should not be used as the sole basis for treatment or other patient management decisions.  A negative result may occur with improper specimen collection /  handling, submission of specimen other than nasopharyngeal swab, presence of viral mutation(s) within the areas targeted by this assay, and inadequate number of viral copies (<250 copies / mL). A negative result must be combined with clinical observations, patient history, and epidemiological information.  Fact Sheet for Patients:   StrictlyIdeas.no  Fact Sheet for Healthcare Providers: BankingDealers.co.za  This test is not yet approved or  cleared by the [place] FDA and has been authorized for detection and/or diagnosis of SARS-CoV-2 by FDA under an Emergency Use Authorization (EUA).  This EUA will remain in effect (meaning this test can be used) for the duration of the COVID-19 declaration under Section 564(b)(1) of the Act, 21 U.S.C. section 360bbb-3(b)(1), unless the authorization is terminated or revoked sooner.  Performed at [facility], [place] [address], [place], [place] [address]   MRSA PCR Screening     Status: None   Collection Time: 07/23/20 10:57 AM   Specimen: Nasal Mucosa; Nasopharyngeal  Result Value Ref Range Status   MRSA by PCR NEGATIVE NEGATIVE Final    Comment:        The GeneXpert MRSA Assay (FDA approved for NASAL specimens only), is one component of a comprehensive MRSA colonization surveillance program. It is not intended to diagnose MRSA infection nor to guide or monitor treatment for MRSA infections. Performed at [facility], [place] [address], [place],  [address]      Radiology Studies: No results found.  Scheduled Meds: . allopurinol  150 mg Oral Daily  . aspirin EC  81  mg Oral Daily  . atorvastatin  40 mg Oral Daily  . cinacalcet  30 mg Oral Q breakfast  . doxercalciferol  3 mcg Intravenous Q M,W,F-HD  . enoxaparin (LOVENOX) injection  30 mg Subcutaneous Daily  . ferric citrate  420 mg Oral TID WC  . midodrine  5 mg Oral TID WC  . multivitamin  1 tablet Oral Daily   Continuous Infusions: . sodium chloride       LOS: 0 days   Marylu Lund, MD [facility] Pager On Amion  If 7PM-7AM, please contact night-coverage 07/24/2020, 5:04 PM

## 2020-07-24 NOTE — Progress Notes (Signed)
Upon arrival back to unit, BP 81/37. Patient asymptomatic. Only has complaints of heart burn. Dr. Wyline Copas made aware. New orders received. Will continue to monitor.

## 2020-07-24 NOTE — TOC Progression Note (Signed)
Transition of Care [facility]) - Progression Note    Patient Details  Name: [name] MRN: [id number] Date of Birth: 1951-06-18  Transition of Care [facility]) CM/SW [place], [place] Phone Number: 07/24/2020, 11:23 AM  Clinical Narrative:    CSW provided SNF bed offers to patient. She reports that she would like to contact her sister to discuss the offers. CSW will follow up.    Expected Discharge Plan: [place] Barriers to Discharge: Continued Medical Work up  Expected Discharge Plan and Services Expected Discharge Plan: [place] arrangements for the past 2 months: Single Family Home                                       Social Determinants of Health (SDOH) Interventions    Readmission Risk Interventions No flowsheet data found.

## 2020-07-25 DIAGNOSIS — R531 Weakness: Secondary | ICD-10-CM | POA: Diagnosis not present

## 2020-07-25 DIAGNOSIS — N186 End stage renal disease: Secondary | ICD-10-CM | POA: Diagnosis not present

## 2020-07-25 DIAGNOSIS — Z992 Dependence on renal dialysis: Secondary | ICD-10-CM | POA: Diagnosis not present

## 2020-07-25 DIAGNOSIS — I5033 Acute on chronic diastolic (congestive) heart failure: Secondary | ICD-10-CM | POA: Diagnosis not present

## 2020-07-25 MED ORDER — DIPHENHYDRAMINE HCL 25 MG PO CAPS
25.0000 mg | ORAL_CAPSULE | Freq: Once | ORAL | Status: AC | PRN
  Administered 2020-07-25: 25 mg via ORAL
  Filled 2020-07-25: qty 1

## 2020-07-25 MED ORDER — NEPRO/CARBSTEADY PO LIQD
237.0000 mL | Freq: Every day | ORAL | Status: DC
  Administered 2020-07-25 – 2020-07-26 (×2): 237 mL via ORAL

## 2020-07-25 NOTE — Progress Notes (Signed)
Initial Nutrition Assessment  RD working remotely.  DOCUMENTATION CODES:   Underweight  INTERVENTION:   -Nepro Shake po daily, each supplement provides 425 kcal and 19 grams protein -Continue renal MVI daily  NUTRITION DIAGNOSIS:   Increased nutrient needs related to chronic illness (ESRD on HD) as evidenced by estimated needs.  GOAL:   Patient will meet greater than or equal to 90% of their needs  MONITOR:   PO intake, Supplement acceptance, Labs, Weight trends, Skin, I & O's  REASON FOR ASSESSMENT:   Consult Assessment of nutrition requirement/status  ASSESSMENT:   [name] is a 69 y.o. 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  Pt admitted with volume overload causing acute on chronic CHF.   8/9- s/p BSE- advanced to regular consistency diet with thin liquids  Reviewed I/O's: +820 ml x 24 hours and +145 ml x 24 hours  Attempted to speak with pt via call to hospital room phone, however, no answer.   Per nephrology notes, last HD treatment 07/24/20; pt had difficulty with low BP's yesterday morning. EDW 48.5 kg. Per review of wt records, pt has experienced a 15.8% wt loss over the past 6 months, which is significant for time frame, however, difficult to interpret due to history of CHF and ESRD on HD.   Per SLP notes, pt with good tolerance of food textures. Noted meal completion 50-100%.   Pt with history of malnutrition. RD suspect pt with malnutrition given weight loss, multiple co-morbidities, and underweight status, however, unable to identify at this time.   Per TOC team notes, plan to d/c to SNF once medically stable for discharge.   Labs reviewed.   Diet Order:   Diet Order            Diet renal with fluid restriction Fluid restriction: 1200 mL Fluid; Room service appropriate? Yes; Fluid consistency: Thin  Diet effective now                 EDUCATION NEEDS:   No education needs  have been identified at this time  Skin:  Skin Assessment: Reviewed RN Assessment  Last BM:  07/24/20  Height:   Ht Readings from Last 1 Encounters:  07/22/20 5' 2.5" (1.588 m)    Weight:   Wt Readings from Last 1 Encounters:  07/25/20 46 kg    Ideal Body Weight:  51.1 kg  BMI:  Body mass index is 18.25 kg/m.  Estimated Nutritional Needs:   Kcal:  1600-1800  Protein:  80-95 grams  Fluid:  1000 ml + UOP    Loistine Chance, RD, LDN, [place] Registered Dietitian II Certified Diabetes Care and Education Specialist Please refer to [facility] for RD and/or RD on-call/weekend/after hours pager

## 2020-07-25 NOTE — Progress Notes (Signed)
Speech Language Pathology Treatment: Dysphagia  Patient Details Name: [name] MRN: [id number] DOB: March 15, 1951 Today's Date: 07/25/2020 Time: 4098-1191 SLP Time Calculation (min) (ACUTE ONLY): 15 min  Assessment / Plan / Recommendation Clinical Impression  Pt was seen for dysphagia treatment and was cooperative throughout the session. Pt's sister was present and was educated regarding the results of the initial evaluation and the plan of care. Pt and nursing reported that the pt has been tolerating the current diet without overt s/sx of aspiration. Pt tolerated regular texture solids and thin liquids via straw using consecutive swallows without symptoms of oropharyngeal dysphagia. Pt was educated regarding the swallow mechanism and strategies to reduce aspiration risk. She verbalized understanding. It is recommended that the current diet be continued. Further skilled SLP services are not clinically indicated at this time.    HPI HPI: 69 y.o. 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 CXR on 07/21/20 indicated Cardiomegaly with suspected mild interstitial edema and small left pleural effusion; BSE initiated d/t recent coughing during consumption of thin liquids per nursing staff.      SLP Plan  All goals met;Discharge SLP treatment due to (comment)       Recommendations  Diet recommendations: Regular;Thin liquid Liquids provided via: Cup;Straw Medication Administration: Other (Comment) (whole meds with liquids as tolerated and/or in puree) Supervision: Patient able to self feed Compensations: Slow rate;Small sips/bites Postural Changes and/or Swallow Maneuvers: Seated upright 90 degrees;Upright 30-60 min after meal                Oral Care Recommendations: Oral care BID Follow up Recommendations: None SLP Visit Diagnosis: Dysphagia, pharyngoesophageal phase (R13.14) Plan: All goals met;Discharge SLP treatment due to  (comment)       Aisea Bouldin I. Vear Clock, MS, CCC-SLP Acute Rehabilitation Services Office number [phone] Pager [phone]                 Scheryl Marten 07/25/2020, 10:31 AM

## 2020-07-25 NOTE — Progress Notes (Signed)
[facility] Progress Note  Subjective: BP's better, gave 500 NS bolus yesterday. Midodrine started as well I believe  Vitals:   07/25/20 0012 07/25/20 0407 07/25/20 0736 07/25/20 1203  BP: (!) 96/51 (!) 106/40 (!) 120/51 90/67  Pulse: 82 70 88 85  Resp: 16 18 18 17   Temp: 98.3 F (36.8 C) 98 F (36.7 C) 99 F (37.2 C)   TempSrc: Oral Oral Oral   SpO2: 97% 91% 95% 98%  Weight:  46 kg    Height:        Exam: Genalert pleasant calm, frail No jvd or bruits Chest CTA bilat RRR no MRG, +transmitted bruit from L avf Abd soft ntnd no mass or ascites +bs Ext no edema Neuro is alert, Ox 3 , nf RUA AVG +bruit    CXR - IMPRESSION: Cardiomegaly with suspected mild interstitial edema and small left pleural effusion.    OP HD: MWF HD  4h 350/1.5  48.5kg  2/2 bath P4  RUA AVG Hep none  (L AVF working but not using anymore)   Hect 3 ug tiw   Mircera 30 mg q2wks, last 7/12   Assessment/ Plan: 1. SOB/ pulm edema/ vol overload - likely due to lean body wt loss in esrd patient.  Wt down 4-5 kg under prior dry wt. Gave some IVF back yesterday due to hypotension. Looks like 46 kg may be a good new dry wt.  2. Hypotension - sp bolus, midodrine started. Better.  3. ESRD - on HD MWF. See above. Using R arm AVG (L AVF still works but aneurysmal and no longer using). HD Friday. UF to 46kg.  4. 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 Gout - allopurinol 6. MBD ckd - cont sensipar, binder, hect IV w hd 7. Anemia ckd - Hb 8.8, last esa 7/12 , was on q4wks, now on a 2wks. No need for esa here, will resume most likely at OP HD later this week.  8. Dispo - seen by PT, SNF placement is pending.       Rob Magie Ciampa 07/25/2020, 12:18 PM   Recent Labs  Lab 07/22/20 0615 07/24/20 0642 07/24/20 0643  K 4.0  --  4.0  BUN 13  --  25*  CREATININE 3.64*  --  5.64*  CALCIUM 10.5*  --  10.2  PHOS  --   --  2.8  HGB 8.6* 10.0*  --    Inpatient medications: . allopurinol  150  mg Oral Daily  . aspirin EC  81 mg Oral Daily  . atorvastatin  40 mg Oral Daily  . cinacalcet  30 mg Oral Q breakfast  . doxercalciferol  3 mcg Intravenous Q M,W,F-HD  . enoxaparin (LOVENOX) injection  30 mg Subcutaneous Daily  . feeding supplement (NEPRO CARB STEADY)  237 mL Oral QHS  . ferric citrate  420 mg Oral TID WC  . midodrine  5 mg Oral TID WC  . multivitamin  1 tablet Oral Daily    acetaminophen **OR** acetaminophen, ALPRAZolam, lactulose, ondansetron **OR** ondansetron (ZOFRAN) IV, zolpidem

## 2020-07-25 NOTE — Progress Notes (Signed)
PROGRESS NOTE    [name]  PIR:[id number] DOB: 05-22-1951 DOA: 07/21/2020 PCP: [name], MD    Brief Narrative:  69 y.o.femalewith medical history significant ofESRD on HD, failed renal tx 96-2012, prior stroke, HTN.Pt presented to ED with c/o SOB  Assessment & Plan:   Principal Problem:   Acute on chronic diastolic heart failure (HCC) Active Problems:   End-stage renal disease on hemodialysis (HCC)   HTN (hypertension)   Volume overload  Acute on chronic HFpEF Dyspnea     - improved on HD     - dyspnea improved, currently on minimal O2 support     - last echo 05/14/20: Left ventricular ejection fraction, by estimation, is 55 to 60%. The left ventricle has normal function. The left ventricle demonstrates regional wall motion abnormalities (see scoring diagram/findings for description). There is moderate concentric left ventricular hypertrophy. Left ventricular diastolic parameters are consistent with Grade II diastolic dysfunction (pseudonormalization).     - denies CP at this time -HD was limited by hypotension, see below -Per nephrology, likely pulmonary edema secondary to marked malnutrition and progressive weight loss, intravascularly dehydrated improved with IVF  ESRD on HD     - per nephro -Noted to have profound hypotension with sbp in the 60's after HD -Have started midodrine, tolerating -Discussed with Nephrologist, tolerated HD 8/11 requiring fluid bolus  HTN with hypotension     - coreg, cozaar now on hold for hypotension overnight -See above, now on midodrine  Generalized weakness     - eval'd by PT; rec SNF     - Follow up per SW  Diarrhea     - noted to have resovled  DVT prophylaxis: Lovenox subq Code Status: Full Family Communication: Pt in room, family not at bedside  Status is: Observation  The patient will require care spanning > 2 midnights and should be moved to inpatient because: Hemodynamically unstable, Unsafe d/c plan and  Inpatient level of care appropriate due to severity of illness  Dispo: The patient is from: Home              Anticipated d/c is to: Home              Anticipated d/c date is: 3 days              Patient currently is not medically stable to d/c.   Consultants:   Nephrology  Procedures:     Antimicrobials: Anti-infectives (From admission, onward)   None      Subjective: Without complaints at this time  Objective: Vitals:   07/25/20 0012 07/25/20 0407 07/25/20 0736 07/25/20 1203  BP: (!) 96/51 (!) 106/40 (!) 120/51 90/67  Pulse: 82 70 88 85  Resp: 16 18 18 17   Temp: 98.3 F (36.8 C) 98 F (36.7 C) 99 F (37.2 C)   TempSrc: Oral Oral Oral   SpO2: 97% 91% 95% 98%  Weight:  46 kg    Height:        Intake/Output Summary (Last 24 hours) at 07/25/2020 1554 Last data filed at 07/25/2020 0804 Gross per 24 hour  Intake 420 ml  Output -400 ml  Net 820 ml   Filed Weights   07/24/20 1227 07/24/20 1615 07/25/20 0407  Weight: 43.7 kg 45 kg 46 kg    Examination: General exam: Awake, laying in bed, in nad Respiratory system: Normal respiratory effort, no wheezing Cardiovascular system: regular rate, s1, s2 Gastrointestinal system: Soft, nondistended, positive BS Central nervous system:  CN2-12 grossly intact, strength intact Extremities: Perfused, no clubbing Skin: Normal skin turgor, no notable skin lesions seen Psychiatry: Mood normal // no visual hallucinations   Data Reviewed: I have personally reviewed following labs and imaging studies  CBC: Recent Labs  Lab 07/21/20 0950 07/21/20 1119 07/22/20 0615 07/24/20 0642  WBC 7.4  --  5.6 8.2  NEUTROABS  --   --   --  5.0  HGB 8.8* 8.8* 8.6* 10.0*  HCT 27.7* 26.0* 27.0* 32.0*  MCV 93.9  --  93.4 97.3  PLT 204  --  212 789   Basic Metabolic Panel: Recent Labs  Lab 07/21/20 0950 07/21/20 1119 07/22/20 0615 07/24/20 0642 07/24/20 0643  NA 141 140 137  --  137  K 3.3* 3.5 4.0  --  4.0  CL 96*  --  97*  --   96*  CO2 26  --  26  --  26  GLUCOSE 138*  --  82  --  98  BUN 23  --  13  --  25*  CREATININE 6.10*  --  3.64*  --  5.64*  CALCIUM 10.4*  --  10.5*  --  10.2  MG  --   --   --  2.4  --   PHOS  --   --   --   --  2.8   GFR: Estimated Creatinine Clearance: 6.8 mL/min (A) (by C-G formula based on SCr of 5.64 mg/dL (H)). Liver Function Tests: Recent Labs  Lab 07/21/20 0950 07/24/20 0643  AST 33  --   ALT 17  --   ALKPHOS 91  --   BILITOT 1.2  --   PROT 6.8  --   ALBUMIN 3.4* 3.4*   Recent Labs  Lab 07/21/20 0950  LIPASE 35   No results for input(s): AMMONIA in the last 168 hours. Coagulation Profile: No results for input(s): INR, PROTIME in the last 168 hours. Cardiac Enzymes: No results for input(s): CKTOTAL, CKMB, CKMBINDEX, TROPONINI in the last 168 hours. BNP (last 3 results) No results for input(s): PROBNP in the last 8760 hours. HbA1C: No results for input(s): HGBA1C in the last 72 hours. CBG: No results for input(s): GLUCAP in the last 168 hours. Lipid Profile: No results for input(s): CHOL, HDL, LDLCALC, TRIG, CHOLHDL, LDLDIRECT in the last 72 hours. Thyroid Function Tests: No results for input(s): TSH, T4TOTAL, FREET4, T3FREE, THYROIDAB in the last 72 hours. Anemia Panel: No results for input(s): VITAMINB12, FOLATE, FERRITIN, TIBC, IRON, RETICCTPCT in the last 72 hours. Sepsis Labs: No results for input(s): PROCALCITON, LATICACIDVEN in the last 168 hours.  Recent Results (from the past 240 hour(s))  SARS Coronavirus 2 by RT PCR (hospital order, performed in [facility] hospital lab) Nasopharyngeal Nasopharyngeal Swab     Status: None   Collection Time: 07/21/20  1:19 PM   Specimen: Nasopharyngeal Swab  Result Value Ref Range Status   SARS Coronavirus 2 NEGATIVE NEGATIVE Final    Comment: (NOTE) SARS-CoV-2 target nucleic acids are NOT DETECTED.  The SARS-CoV-2 RNA is generally detectable in upper and lower respiratory specimens during the acute phase of  infection. The lowest concentration of SARS-CoV-2 viral copies this assay can detect is 250 copies / mL. A negative result does not preclude SARS-CoV-2 infection and should not be used as the sole basis for treatment or other patient management decisions.  A negative result may occur with improper specimen collection / handling, submission of specimen other than nasopharyngeal swab, presence  of viral mutation(s) within the areas targeted by this assay, and inadequate number of viral copies (<250 copies / mL). A negative result must be combined with clinical observations, patient history, and epidemiological information.  Fact Sheet for Patients:   StrictlyIdeas.no  Fact Sheet for Healthcare Providers: BankingDealers.co.za  This test is not yet approved or  cleared by the [place] FDA and has been authorized for detection and/or diagnosis of SARS-CoV-2 by FDA under an Emergency Use Authorization (EUA).  This EUA will remain in effect (meaning this test can be used) for the duration of the COVID-19 declaration under Section 564(b)(1) of the Act, 21 U.S.C. section 360bbb-3(b)(1), unless the authorization is terminated or revoked sooner.  Performed at [facility], [place] [address], [place], [place] [address]   MRSA PCR Screening     Status: None   Collection Time: 07/23/20 10:57 AM   Specimen: Nasal Mucosa; Nasopharyngeal  Result Value Ref Range Status   MRSA by PCR NEGATIVE NEGATIVE Final    Comment:        The GeneXpert MRSA Assay (FDA approved for NASAL specimens only), is one component of a comprehensive MRSA colonization surveillance program. It is not intended to diagnose MRSA infection nor to guide or monitor treatment for MRSA infections. Performed at [facility], [place] [address] [place], [place] [address]      Radiology Studies: No results found.  Scheduled Meds: . allopurinol  150 mg Oral Daily  .  aspirin EC  81 mg Oral Daily  . atorvastatin  40 mg Oral Daily  . cinacalcet  30 mg Oral Q breakfast  . doxercalciferol  3 mcg Intravenous Q M,W,F-HD  . enoxaparin (LOVENOX) injection  30 mg Subcutaneous Daily  . feeding supplement (NEPRO CARB STEADY)  237 mL Oral QHS  . ferric citrate  420 mg Oral TID WC  . midodrine  5 mg Oral TID WC  . multivitamin  1 tablet Oral Daily   Continuous Infusions:    LOS: 1 day   Marylu Lund, MD [facility] Pager On Amion  If 7PM-7AM, please contact night-coverage 07/25/2020, 3:54 PM

## 2020-07-25 NOTE — TOC Progression Note (Signed)
Transition of Care (TOC) - Progression Note  8/12  11:00 am Spoke with patient and sister Neelah, they chose [place]. 11:30 [place] full, spoke with family and they chose [place], they will start insurance auth process. Patient will require updated covid test.   Patient Details  Name: [name] MRN: [id number] Date of Birth: 02-Feb-1951  Transition of Care [facility]) CM/SW [place], [place] Phone Number: 07/25/2020, 11:44 AM  Clinical Narrative:       Expected Discharge Plan: [place] Barriers to Discharge: Insurance Authorization, Continued Medical Work up  Expected Discharge Plan and Services Expected Discharge Plan: [place] arrangements for the past 2 months: Single Family Home                                       Social Determinants of Health (SDOH) Interventions    Readmission Risk Interventions No flowsheet data found.

## 2020-07-26 DIAGNOSIS — R531 Weakness: Secondary | ICD-10-CM | POA: Diagnosis not present

## 2020-07-26 DIAGNOSIS — N186 End stage renal disease: Secondary | ICD-10-CM | POA: Diagnosis not present

## 2020-07-26 DIAGNOSIS — I5033 Acute on chronic diastolic (congestive) heart failure: Secondary | ICD-10-CM | POA: Diagnosis not present

## 2020-07-26 DIAGNOSIS — Z992 Dependence on renal dialysis: Secondary | ICD-10-CM | POA: Diagnosis not present

## 2020-07-26 LAB — CBC
HCT: 26.4 % — ABNORMAL LOW (ref 36.0–46.0)
Hemoglobin: 8.3 g/dL — ABNORMAL LOW (ref 12.0–15.0)
MCH: 30 pg (ref 26.0–34.0)
MCHC: 31.4 g/dL (ref 30.0–36.0)
MCV: 95.3 fL (ref 80.0–100.0)
Platelets: 247 10*3/uL (ref 150–400)
RBC: 2.77 MIL/uL — ABNORMAL LOW (ref 3.87–5.11)
RDW: 16.1 % — ABNORMAL HIGH (ref 11.5–15.5)
WBC: 7.1 10*3/uL (ref 4.0–10.5)
nRBC: 0 % (ref 0.0–0.2)

## 2020-07-26 LAB — RENAL FUNCTION PANEL
Albumin: 2.8 g/dL — ABNORMAL LOW (ref 3.5–5.0)
Anion gap: 13 (ref 5–15)
BUN: 34 mg/dL — ABNORMAL HIGH (ref 8–23)
CO2: 26 mmol/L (ref 22–32)
Calcium: 9.7 mg/dL (ref 8.9–10.3)
Chloride: 95 mmol/L — ABNORMAL LOW (ref 98–111)
Creatinine, Ser: 6.52 mg/dL — ABNORMAL HIGH (ref 0.44–1.00)
GFR calc Af Amer: 7 mL/min — ABNORMAL LOW (ref >60)
GFR calc non Af Amer: 6 mL/min — ABNORMAL LOW (ref >60)
Glucose, Bld: 100 mg/dL — ABNORMAL HIGH (ref 70–99)
Phosphorus: 2.2 mg/dL — ABNORMAL LOW (ref 2.5–4.6)
Potassium: 3.7 mmol/L (ref 3.5–5.1)
Sodium: 134 mmol/L — ABNORMAL LOW (ref 135–145)

## 2020-07-26 LAB — SARS CORONAVIRUS 2 (TAT 6-24 HRS): SARS Coronavirus 2: NEGATIVE

## 2020-07-26 MED ORDER — POLYETHYLENE GLYCOL 3350 17 G PO PACK
17.0000 g | PACK | Freq: Every day | ORAL | Status: DC
  Administered 2020-07-26 – 2020-07-27 (×2): 17 g via ORAL
  Filled 2020-07-26 (×2): qty 1

## 2020-07-26 NOTE — Progress Notes (Signed)
Occupational Therapy Treatment Patient Details Name: [name] MRN: [id number] DOB: May 02, 1951 Today's Date: 07/26/2020    History of present illness 69 y.o. female presenting with n/v/d, SOB, generalized weakness, and cough x24hr. Concern for volume overload 2/2 ESRD. PMHx significant for CAD, CHF, depression/anxiety, ESRD on HD MWF, HLD, HTN, neuropathy, and Hx of CVA.   OT comments  Patient continues to make steady progress towards goals in skilled OT session. Patient's session encompassed functional mobility and transfer to the bathroom as pt verbalized wanting to walk and get stronger. While pt is oriented, pt requires cues to maintain attention to task, and frequently asked therapist "what was your name again". Pt requires cues and education with regard to walker management and safety, as pt will often sit preemptively nor will she reach back for her surroundings. Pt noted to have increased difficulty navigating RW on L side, often running into furniture requiring increased cues to correct. Discharge remains appropriate at this time; will continue to follow acutely.    Follow Up Recommendations  SNF    Equipment Recommendations  Other (comment) (defer to next venue)    Recommendations for Other Services      Precautions / Restrictions Precautions Precautions: Fall Restrictions Weight Bearing Restrictions: No       Mobility Bed Mobility Overal bed mobility: Needs Assistance Bed Mobility: Supine to Sit     Supine to sit: Min assist     General bed mobility comments: Cues to sequence  Transfers Overall transfer level: Needs assistance Equipment used: Rolling walker (2 wheeled) Transfers: Sit to/from [facility] Sit to Stand: Min assist;Mod assist Stand pivot transfers: Mod assist       General transfer comment: Mod A for sit to stand from recliner with cues for hand placement as pt likes to try to pull on RW.  Steadying assist once up as well.      Balance Overall balance assessment: Needs assistance Sitting-balance support: No upper extremity supported;Feet supported Sitting balance-Leahy Scale: Fair     Standing balance support: Bilateral upper extremity supported Standing balance-Leahy Scale: Poor Standing balance comment: BUE support on RW and external assist                            ADL either performed or assessed with clinical judgement   ADL Overall ADL's : Needs assistance/impaired                     Lower Body Dressing: Sitting/lateral leans;Minimal assistance Lower Body Dressing Details (indicate cue type and reason): Min A to adjust R sock Toilet Transfer: Minimal assistance;Ambulation;RW;Grab bars Toilet Transfer Details (indicate cue type and reason): Cues required to use grab bars and position over toilet Toileting- Clothing Manipulation and Hygiene: Moderate assistance;Sit to/from stand;Sitting/lateral lean Toileting - Clothing Manipulation Details (indicate cue type and reason): Mod A for hygiene/clothing management in sitting/standing 2/2 decreased balance     Functional mobility during ADLs: Moderate assistance;Rolling walker;Minimal assistance;Cueing for sequencing;Cueing for safety General ADL Comments: Pt motivated to walk and attempt to use the restroom, pt required increased cues for safety and walker management, often running into furniture and sitting pre-emptively     Vision       Perception     Praxis      Cognition Arousal/Alertness: Awake/alert Behavior During Therapy: WFL for tasks assessed/performed Overall Cognitive Status: Impaired/Different from baseline Area of Impairment: Orientation;Attention;Memory;Following commands;Safety/judgement;Awareness;Problem solving  Orientation Level: Time Current Attention Level: Sustained Memory: Decreased short-term memory Following Commands: Follows multi-step commands inconsistently;Follows  multi-step commands with increased time Safety/Judgement: Decreased awareness of safety;Decreased awareness of deficits Awareness: Emergent Problem Solving: Slow processing;Difficulty sequencing;Requires verbal cues General Comments: Pt very cooperative, but needs increased cues to sequence appropriately        Exercises     Shoulder Instructions       General Comments      Pertinent Vitals/ Pain       Pain Assessment: No/denies pain  Home Living                                          Prior Functioning/Environment              Frequency  Min 2X/week        Progress Toward Goals  OT Goals(current goals can now be found in the care plan section)  Progress towards OT goals: Progressing toward goals  Acute Rehab OT Goals Patient Stated Goal: to get better OT Goal Formulation: With patient Time For Goal Achievement: 08/05/20 Potential to Achieve Goals: Good  Plan Discharge plan remains appropriate    Co-evaluation                 AM-PAC OT "6 Clicks" Daily Activity     Outcome Measure   Help from another person eating meals?: A Little Help from another person taking care of personal grooming?: A Little Help from another person toileting, which includes using toliet, bedpan, or urinal?: A Little Help from another person bathing (including washing, rinsing, drying)?: A Little Help from another person to put on and taking off regular upper body clothing?: A Little Help from another person to put on and taking off regular lower body clothing?: A Little 6 Click Score: 18    End of Session Equipment Utilized During Treatment: Gait belt;Rolling walker  OT Visit Diagnosis: Unsteadiness on feet (R26.81);Other abnormalities of gait and mobility (R26.89);Repeated falls (R29.6);Muscle weakness (generalized) (M62.81)   Activity Tolerance Patient tolerated treatment well   Patient Left in chair;with call bell/phone within reach;with chair  alarm set   Nurse Communication Mobility status;Other (comment) (Pt requesting something for constipation)        Time: 5449-2010 OT Time Calculation (min): 23 min  Charges: OT General Charges $OT Visit: 1 Visit OT Treatments $Self Care/Home Management : 23-37 mins  [place]. [place], [place] [facility] [place] 07/26/2020, 4:00 PM

## 2020-07-26 NOTE — TOC Progression Note (Addendum)
Transition of Care [facility]) - Progression Note    Patient Details  Name: [name] MRN: [id number] Date of Birth: 1951-08-30  Transition of Care [facility]) CM/SW [place], [place] Phone Number: 07/26/2020, 3:24 PM  Clinical Narrative:    8/13 Spoke with pt's [name], updated her on hopeful discharge for tomorrow to [place].   Expected Discharge Plan: Skilled Nursing Facility Barriers to Discharge: Ship broker, Continued Medical Work up  Expected Discharge Plan and Services Expected Discharge Plan: [place] arrangements for the past 2 months: Single Family Home                                       Social Determinants of Health (SDOH) Interventions    Readmission Risk Interventions No flowsheet data found.

## 2020-07-26 NOTE — TOC Progression Note (Signed)
Transition of Care [facility]) - Progression Note    Patient Details  Name: [name] MRN: [id number] Date of Birth: [birth year]  Transition of Care [facility]) CM/SW [place], [place] Phone Number: 07/26/2020, 1:03 PM  Clinical Narrative:    [place] has insurance approval when patient is medically stable. Will require an updated COVID test.     Expected Discharge Plan: [place] Barriers to Discharge: Ship broker, Continued Medical Work up  Expected Discharge Plan and Services Expected Discharge Plan: [place] arrangements for the past 2 months: Single Family Home                                       Social Determinants of Health (SDOH) Interventions    Readmission Risk Interventions No flowsheet data found.

## 2020-07-26 NOTE — Progress Notes (Signed)
PROGRESS NOTE    [name]  YIR:[id number] DOB: May 07, 1951 DOA: 07/21/2020 PCP: [name], MD    Brief Narrative:  69 y.o.femalewith medical history significant ofESRD on HD, failed renal tx 96-2012, prior stroke, HTN.Pt presented to ED with c/o SOB  Assessment & Plan:   Principal Problem:   Acute on chronic diastolic heart failure (HCC) Active Problems:   End-stage renal disease on hemodialysis (HCC)   HTN (hypertension)   Volume overload  Acute on chronic HFpEF Dyspnea     - improved on HD     - dyspnea improved, currently on minimal O2 support     - last echo 05/14/20: Left ventricular ejection fraction, by estimation, is 55 to 60%. The left ventricle has normal function. The left ventricle demonstrates regional wall motion abnormalities (see scoring diagram/findings for description). There is moderate concentric left ventricular hypertrophy. Left ventricular diastolic parameters are consistent with Grade II diastolic dysfunction (pseudonormalization).     - denies CP at this time -HD was initially limited by hypotension, see below -Per nephrology, likely pulmonary edema secondary to marked malnutrition and progressive weight loss, intravascularly dehydrated improved with IVF -Seen on HD today, tolerated well  ESRD on HD     - per nephro -Noted to have profound hypotension with sbp in the 60's after HD -Have started midodrine, tolerating -Discussed with Nephrologist, tolerated HD 8/11 requiring fluid bolus -Per above, pt tolerating HD this AM  HTN with hypotension     - coreg, cozaar now on hold for hypotension overnight -See above, now on midodrine  Generalized weakness     - eval'd by PT; rec SNF     - Follow up per SW -COVID test ordered, pending for placement  Diarrhea     - noted to have resovled  DVT prophylaxis: Lovenox subq Code Status: Full Family Communication: Pt in room, family not at bedside  Status is: Inpatient  The patient  will require care spanning > 2 midnights because: Hemodynamically unstable, Unsafe d/c plan and Inpatient level of care appropriate due to severity of illness  Dispo: The patient is from: Home              Anticipated d/c is to: SNF              Anticipated d/c date is: 1 day              Patient currently is medically stable to d/c. Just awaiting placement   Consultants:   Nephrology  Procedures:     Antimicrobials: Anti-infectives (From admission, onward)   None      Subjective: No complaints today, was seen just after finishing HD  Objective: Vitals:   07/26/20 1200 07/26/20 1218 07/26/20 1230 07/26/20 1250  BP: (!) 124/50 (!) 111/41 (!) 113/35 (!) 161/69  Pulse: 77 76 66 84  Resp: 13 18 18 16   Temp:   (!) 97.5 F (36.4 C) 98.2 F (36.8 C)  TempSrc:   Oral Oral  SpO2:  98%    Weight:  46 kg    Height:        Intake/Output Summary (Last 24 hours) at 07/26/2020 1359 Last data filed at 07/26/2020 1218 Gross per 24 hour  Intake 180 ml  Output 1400 ml  Net -1220 ml   Filed Weights   07/26/20 0504 07/26/20 0843 07/26/20 1218  Weight: 47.2 kg 47.4 kg 46 kg    Examination: General exam: Conversant, in no acute distress Respiratory system: normal chest  rise, clear, no audible wheezing Cardiovascular system: regular rhythm, s1-s2 Gastrointestinal system: Nondistended, nontender, pos BS Central nervous system: No seizures, no tremors Extremities: No cyanosis, no joint deformities Skin: No rashes, no pallor Psychiatry: Affect normal // no auditory hallucinations   Data Reviewed: I have personally reviewed following labs and imaging studies  CBC: Recent Labs  Lab 07/21/20 0950 07/21/20 1119 07/22/20 0615 07/24/20 0642 07/26/20 0920  WBC 7.4  --  5.6 8.2 7.1  NEUTROABS  --   --   --  5.0  --   HGB 8.8* 8.8* 8.6* 10.0* 8.3*  HCT 27.7* 26.0* 27.0* 32.0* 26.4*  MCV 93.9  --  93.4 97.3 95.3  PLT 204  --  212 269 154   Basic Metabolic Panel: Recent Labs    Lab 07/21/20 0950 07/21/20 1119 07/22/20 0615 07/24/20 0642 07/24/20 0643 07/26/20 0920  NA 141 140 137  --  137 134*  K 3.3* 3.5 4.0  --  4.0 3.7  CL 96*  --  97*  --  96* 95*  CO2 26  --  26  --  26 26  GLUCOSE 138*  --  82  --  98 100*  BUN 23  --  13  --  25* 34*  CREATININE 6.10*  --  3.64*  --  5.64* 6.52*  CALCIUM 10.4*  --  10.5*  --  10.2 9.7  MG  --   --   --  2.4  --   --   PHOS  --   --   --   --  2.8 2.2*   GFR: Estimated Creatinine Clearance: 5.9 mL/min (A) (by C-G formula based on SCr of 6.52 mg/dL (H)). Liver Function Tests: Recent Labs  Lab 07/21/20 0950 07/24/20 0643 07/26/20 0920  AST 33  --   --   ALT 17  --   --   ALKPHOS 91  --   --   BILITOT 1.2  --   --   PROT 6.8  --   --   ALBUMIN 3.4* 3.4* 2.8*   Recent Labs  Lab 07/21/20 0950  LIPASE 35   No results for input(s): AMMONIA in the last 168 hours. Coagulation Profile: No results for input(s): INR, PROTIME in the last 168 hours. Cardiac Enzymes: No results for input(s): CKTOTAL, CKMB, CKMBINDEX, TROPONINI in the last 168 hours. BNP (last 3 results) No results for input(s): PROBNP in the last 8760 hours. HbA1C: No results for input(s): HGBA1C in the last 72 hours. CBG: No results for input(s): GLUCAP in the last 168 hours. Lipid Profile: No results for input(s): CHOL, HDL, LDLCALC, TRIG, CHOLHDL, LDLDIRECT in the last 72 hours. Thyroid Function Tests: No results for input(s): TSH, T4TOTAL, FREET4, T3FREE, THYROIDAB in the last 72 hours. Anemia Panel: No results for input(s): VITAMINB12, FOLATE, FERRITIN, TIBC, IRON, RETICCTPCT in the last 72 hours. Sepsis Labs: No results for input(s): PROCALCITON, LATICACIDVEN in the last 168 hours.  Recent Results (from the past 240 hour(s))  SARS Coronavirus 2 by RT PCR (hospital order, performed in [facility] hospital lab) Nasopharyngeal Nasopharyngeal Swab     Status: None   Collection Time: 07/21/20  1:19 PM   Specimen: Nasopharyngeal Swab   Result Value Ref Range Status   SARS Coronavirus 2 NEGATIVE NEGATIVE Final    Comment: (NOTE) SARS-CoV-2 target nucleic acids are NOT DETECTED.  The SARS-CoV-2 RNA is generally detectable in upper and lower respiratory specimens during the acute phase of infection. The  lowest concentration of SARS-CoV-2 viral copies this assay can detect is 250 copies / mL. A negative result does not preclude SARS-CoV-2 infection and should not be used as the sole basis for treatment or other patient management decisions.  A negative result may occur with improper specimen collection / handling, submission of specimen other than nasopharyngeal swab, presence of viral mutation(s) within the areas targeted by this assay, and inadequate number of viral copies (<250 copies / mL). A negative result must be combined with clinical observations, patient history, and epidemiological information.  Fact Sheet for Patients:   StrictlyIdeas.no  Fact Sheet for Healthcare Providers: BankingDealers.co.za  This test is not yet approved or  cleared by the [place] FDA and has been authorized for detection and/or diagnosis of SARS-CoV-2 by FDA under an Emergency Use Authorization (EUA).  This EUA will remain in effect (meaning this test can be used) for the duration of the COVID-19 declaration under Section 564(b)(1) of the Act, 21 U.S.C. section 360bbb-3(b)(1), unless the authorization is terminated or revoked sooner.  Performed at [facility], [place] [address] [place], [place] [address]   MRSA PCR Screening     Status: None   Collection Time: 07/23/20 10:57 AM   Specimen: Nasal Mucosa; Nasopharyngeal  Result Value Ref Range Status   MRSA by PCR NEGATIVE NEGATIVE Final    Comment:        The GeneXpert MRSA Assay (FDA approved for NASAL specimens only), is one component of a comprehensive MRSA colonization surveillance program. It is not intended to  diagnose MRSA infection nor to guide or monitor treatment for MRSA infections. Performed at [facility], [place] [address] [place],  [address]      Radiology Studies: No results found.  Scheduled Meds: . allopurinol  150 mg Oral Daily  . aspirin EC  81 mg Oral Daily  . atorvastatin  40 mg Oral Daily  . cinacalcet  30 mg Oral Q breakfast  . doxercalciferol  3 mcg Intravenous Q M,W,F-HD  . enoxaparin (LOVENOX) injection  30 mg Subcutaneous Daily  . feeding supplement (NEPRO CARB STEADY)  237 mL Oral QHS  . ferric citrate  420 mg Oral TID WC  . midodrine  5 mg Oral TID WC  . multivitamin  1 tablet Oral Daily   Continuous Infusions:    LOS: 2 days   Marylu Lund, MD [facility] Pager On Amion  If 7PM-7AM, please contact night-coverage 07/26/2020, 1:59 PM

## 2020-07-27 DIAGNOSIS — R0602 Shortness of breath: Secondary | ICD-10-CM | POA: Diagnosis not present

## 2020-07-27 DIAGNOSIS — R0689 Other abnormalities of breathing: Secondary | ICD-10-CM | POA: Diagnosis not present

## 2020-07-27 DIAGNOSIS — M150 Primary generalized (osteo)arthritis: Secondary | ICD-10-CM | POA: Diagnosis not present

## 2020-07-27 DIAGNOSIS — E8770 Fluid overload, unspecified: Secondary | ICD-10-CM | POA: Diagnosis not present

## 2020-07-27 DIAGNOSIS — Z992 Dependence on renal dialysis: Secondary | ICD-10-CM | POA: Diagnosis not present

## 2020-07-27 DIAGNOSIS — N250 Renal osteodystrophy: Secondary | ICD-10-CM | POA: Diagnosis not present

## 2020-07-27 DIAGNOSIS — N2581 Secondary hyperparathyroidism of renal origin: Secondary | ICD-10-CM | POA: Diagnosis not present

## 2020-07-27 DIAGNOSIS — D631 Anemia in chronic kidney disease: Secondary | ICD-10-CM | POA: Diagnosis not present

## 2020-07-27 DIAGNOSIS — M2550 Pain in unspecified joint: Secondary | ICD-10-CM | POA: Diagnosis not present

## 2020-07-27 DIAGNOSIS — J810 Acute pulmonary edema: Secondary | ICD-10-CM | POA: Diagnosis not present

## 2020-07-27 DIAGNOSIS — F29 Unspecified psychosis not due to a substance or known physiological condition: Secondary | ICD-10-CM | POA: Diagnosis not present

## 2020-07-27 DIAGNOSIS — E46 Unspecified protein-calorie malnutrition: Secondary | ICD-10-CM | POA: Diagnosis not present

## 2020-07-27 DIAGNOSIS — R5381 Other malaise: Secondary | ICD-10-CM | POA: Diagnosis not present

## 2020-07-27 DIAGNOSIS — E7800 Pure hypercholesterolemia, unspecified: Secondary | ICD-10-CM | POA: Diagnosis not present

## 2020-07-27 DIAGNOSIS — I509 Heart failure, unspecified: Secondary | ICD-10-CM | POA: Diagnosis not present

## 2020-07-27 DIAGNOSIS — R112 Nausea with vomiting, unspecified: Secondary | ICD-10-CM | POA: Diagnosis not present

## 2020-07-27 DIAGNOSIS — R0989 Other specified symptoms and signs involving the circulatory and respiratory systems: Secondary | ICD-10-CM | POA: Diagnosis not present

## 2020-07-27 DIAGNOSIS — J811 Chronic pulmonary edema: Secondary | ICD-10-CM | POA: Diagnosis not present

## 2020-07-27 DIAGNOSIS — I5023 Acute on chronic systolic (congestive) heart failure: Secondary | ICD-10-CM | POA: Diagnosis not present

## 2020-07-27 DIAGNOSIS — I120 Hypertensive chronic kidney disease with stage 5 chronic kidney disease or end stage renal disease: Secondary | ICD-10-CM | POA: Diagnosis not present

## 2020-07-27 DIAGNOSIS — I21A1 Myocardial infarction type 2: Secondary | ICD-10-CM | POA: Diagnosis not present

## 2020-07-27 DIAGNOSIS — I5033 Acute on chronic diastolic (congestive) heart failure: Secondary | ICD-10-CM | POA: Diagnosis not present

## 2020-07-27 DIAGNOSIS — Z7401 Bed confinement status: Secondary | ICD-10-CM | POA: Diagnosis not present

## 2020-07-27 DIAGNOSIS — I2510 Atherosclerotic heart disease of native coronary artery without angina pectoris: Secondary | ICD-10-CM | POA: Diagnosis not present

## 2020-07-27 DIAGNOSIS — R4182 Altered mental status, unspecified: Secondary | ICD-10-CM | POA: Diagnosis not present

## 2020-07-27 DIAGNOSIS — K5901 Slow transit constipation: Secondary | ICD-10-CM | POA: Diagnosis not present

## 2020-07-27 DIAGNOSIS — T8612 Kidney transplant failure: Secondary | ICD-10-CM | POA: Diagnosis not present

## 2020-07-27 DIAGNOSIS — N186 End stage renal disease: Secondary | ICD-10-CM | POA: Diagnosis not present

## 2020-07-27 DIAGNOSIS — R531 Weakness: Secondary | ICD-10-CM | POA: Diagnosis not present

## 2020-07-27 MED ORDER — DARBEPOETIN ALFA 40 MCG/0.4ML IJ SOSY: 40.0000 ug | PREFILLED_SYRINGE | INTRAMUSCULAR | Status: DC

## 2020-07-27 MED ORDER — MIDODRINE HCL 5 MG PO TABS: 5.0000 mg | ORAL_TABLET | Freq: Three times a day (TID) | ORAL | 0 refills | Status: AC

## 2020-07-27 MED ORDER — ZOLPIDEM TARTRATE 5 MG PO TABS: 5.0000 mg | ORAL_TABLET | Freq: Every evening | ORAL | 0 refills | Status: DC | PRN

## 2020-07-27 MED ORDER — PANTOPRAZOLE SODIUM 40 MG PO TBEC
40.0000 mg | DELAYED_RELEASE_TABLET | Freq: Every day | ORAL | Status: DC
  Administered 2020-07-27: 40 mg via ORAL
  Filled 2020-07-27: qty 1

## 2020-07-27 NOTE — Progress Notes (Addendum)
[facility] Progress Note  Subjective: pt stable, no SOB or cough.  Standing wt done by me was 48.8kg  Vitals:   07/26/20 2000 07/27/20 0531 07/27/20 0644 07/27/20 0822  BP: 97/82 (!) 90/35 (!) 117/48 118/78  Pulse: 76 76 76 89  Resp: 17 16  14   Temp: 98.2 F (36.8 C) 98.4 F (36.9 C)  98.5 F (36.9 C)  TempSrc: Oral Oral  Oral  SpO2: 94% 98%  98%  Weight:  49 kg    Height:        Exam: Genalert pleasant calm, frail No jvd or bruits Chest CTA bilat RRR no MRG, +transmitted bruit from L avf Abd soft ntnd no mass or ascites +bs Ext no edema Neuro is alert, Ox 3 , nf RUA AVG +bruit    CXR - IMPRESSION: Cardiomegaly with suspected mild interstitial edema and small left pleural effusion.    OP HD: MWF HD  4h 350/1.5  48.5kg  2/2 bath P4  RUA AVG Hep none  (L AVF working but not using anymore)   Hect 3 ug tiw   Mircera 30 mg q2wks, last 7/12   Assessment/ Plan: 1. SOB/ pulm edema/ vol overload - likely due to lean body wt loss in esrd patient.  Resolved w/ HD, dry wt lowered , lowest wts here 44-45kg. Stand wt today is up to 48.8kg, told pt to cut back on ice and fluids. Get vol down w/ HD Monday, goal 46kg. Now is awaiting SNFP.  2. Hypotension - sp bolus, midodrine started. Better.  3. ESRD - on HD MWF. See above. Using R arm AVG (L AVF still works but aneurysmal and no longer using). Next HD Monday.  4. 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 Gout - allopurinol 6. MBD ckd - cont sensipar, binder, hect IV w hd 7. Anemia ckd - Hb 8.8, last esa 7/12 , was on q4wks, now on a 2wks. Hb 8.2 , will start darbe 40 ug next week if still here.  8. Dispo - seen by PT, SNF placement may be accomplished. Pt has DC summ , may be discharging today.      Rob Layia Walla 07/26/2020, 1:36 PM   Recent Labs  Lab 07/22/20 0615 07/24/20 0642 07/24/20 0643 07/26/20 0920  K   < >  --  4.0 3.7  BUN   < >  --  25* 34*  CREATININE   < >  --  5.64* 6.52*  CALCIUM    < >  --  10.2 9.7  PHOS  --   --  2.8 2.2*  HGB  --  10.0*  --  8.3*   < > = values in this interval not displayed.   Inpatient medications:  allopurinol  150 mg Oral Daily   aspirin EC  81 mg Oral Daily   atorvastatin  40 mg Oral Daily   cinacalcet  30 mg Oral Q breakfast   doxercalciferol  3 mcg Intravenous Q M,W,F-HD   enoxaparin (LOVENOX) injection  30 mg Subcutaneous Daily   feeding supplement (NEPRO CARB STEADY)  237 mL Oral QHS   ferric citrate  420 mg Oral TID WC   midodrine  5 mg Oral TID WC   multivitamin  1 tablet Oral Daily   pantoprazole  40 mg Oral Daily   polyethylene glycol  17 g Oral Daily    acetaminophen **OR** acetaminophen, ALPRAZolam, lactulose, ondansetron **OR** ondansetron (ZOFRAN) IV, zolpidem

## 2020-07-27 NOTE — Progress Notes (Signed)
Report called and given to RN at [facility].

## 2020-07-27 NOTE — Progress Notes (Signed)
[facility] Progress Note  Subjective: BP's better, gave 500 NS bolus yesterday. Midodrine started as well I believe  Vitals:   07/26/20 2000 07/27/20 0531 07/27/20 0644 07/27/20 0822  BP: 97/82 (!) 90/35 (!) 117/48 118/78  Pulse: 76 76 76 89  Resp: 17 16  14   Temp: 98.2 F (36.8 C) 98.4 F (36.9 C)  98.5 F (36.9 C)  TempSrc: Oral Oral  Oral  SpO2: 94% 98%  98%  Weight:  49 kg    Height:        Exam: Genalert pleasant calm, frail No jvd or bruits Chest CTA bilat RRR no MRG, +transmitted bruit from L avf Abd soft ntnd no mass or ascites +bs Ext no edema Neuro is alert, Ox 3 , nf RUA AVG +bruit    CXR - IMPRESSION: Cardiomegaly with suspected mild interstitial edema and small left pleural effusion.    OP HD: MWF HD  4h 350/1.5  48.5kg  2/2 bath P4  RUA AVG Hep none  (L AVF working but not using anymore)   Hect 3 ug tiw   Mircera 30 mg q2wks, last 7/12   Assessment/ Plan: 1. SOB/ pulm edema/ vol overload - likely due to lean body wt loss in esrd patient.  Resolved w/ HD, dry wt lowered , lowest wts here 44-45kg. Now is awaiting SNFP.  2. Hypotension - sp bolus, midodrine started. Better.  3. ESRD - on HD MWF. See above. Using R arm AVG (L AVF still works but aneurysmal and no longer using). Next HD Monday.  4. 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 Gout - allopurinol 6. MBD ckd - cont sensipar, binder, hect IV w hd 7. Anemia ckd - Hb 8.8, last esa 7/12 , was on q4wks, now on a 2wks. No need for esa here, will resume most likely at OP HD later this week.  8. Dispo - seen by PT, SNF placement is pending.       Rob Aundraya Dripps 07/26/2020, 11:16 AM   Recent Labs  Lab 07/22/20 0615 07/24/20 0642 07/24/20 0643 07/26/20 0920  K   < >  --  4.0 3.7  BUN   < >  --  25* 34*  CREATININE   < >  --  5.64* 6.52*  CALCIUM   < >  --  10.2 9.7  PHOS  --   --  2.8 2.2*  HGB  --  10.0*  --  8.3*   < > = values in this interval not displayed.    Inpatient medications: . allopurinol  150 mg Oral Daily  . aspirin EC  81 mg Oral Daily  . atorvastatin  40 mg Oral Daily  . cinacalcet  30 mg Oral Q breakfast  . doxercalciferol  3 mcg Intravenous Q M,W,F-HD  . enoxaparin (LOVENOX) injection  30 mg Subcutaneous Daily  . feeding supplement (NEPRO CARB STEADY)  237 mL Oral QHS  . ferric citrate  420 mg Oral TID WC  . midodrine  5 mg Oral TID WC  . multivitamin  1 tablet Oral Daily  . polyethylene glycol  17 g Oral Daily    acetaminophen **OR** acetaminophen, ALPRAZolam, lactulose, ondansetron **OR** ondansetron (ZOFRAN) IV, zolpidem

## 2020-07-27 NOTE — Discharge Summary (Signed)
Physician Discharge Summary  [name] LKG:[id number] DOB: September 21, 1951 DOA: 07/21/2020  PCP: [name], MD  Admit date: 07/21/2020 Discharge date: 07/27/2020  Admitted From: Home Disposition:  SNF  Recommendations for Outpatient Follow-up:  1. Follow up with PCP in 1-2 weeks 2. Continue with MWF as scheduled (next HD on 8/16)  [place] reviewed. Has been receiving ambien as outpt, will prescribe very limited quantity as prescriptions are required for transfer to SNF  COVID test is neg  Discharge Condition:Improved CODE STATUS:Full Diet recommendation: Renal   Brief/Interim Summary: 69 y.o.femalewith medical history significant ofESRD on HD, failed renal tx 96-2012, prior stroke, HTN.Pt presented to ED with c/o SOB  Discharge Diagnoses:  Principal Problem:   Acute on chronic diastolic heart failure (HCC) Active Problems:   End-stage renal disease on hemodialysis (HCC)   HTN (hypertension)   Volume overload  Acute on chronic HFpEF Dyspnea - improved on HD - dyspnea improved, currently on minimal O2 support - last echo 05/14/20: Left ventricular ejection fraction, by estimation, is 55 to 60%. The left ventricle has normal function. The left ventricle demonstrates regional wall motion abnormalities (see scoring diagram/findings for description). There is moderate concentric left ventricular hypertrophy. Left ventricular diastolic parameters are consistent with Grade II diastolic dysfunction (pseudonormalization). - denies CP at this time -HD was initially limited by hypotension, see below -Per nephrology, likely pulmonary edema secondary to marked malnutrition and progressive weight loss, intravascularly dehydrated improved with IVF -Tolerated HD this visit  ESRD on HD - per nephro -Noted to have profound hypotension with sbp in the 60's after HD -Have started midodrine, tolerating -Discussed with Nephrologist, tolerated HD 8/11 requiring fluid  bolus -Per above, pt tolerating HD. Next HD on 8/16  HTN with hypotension - coreg, cozaar now on hold for hypotension at presentation -See above, now on midodrine with normalized BP  Generalized weakness - eval'd by PT; rec SNF - Follow up per SW -COVID test negative  Diarrhea - noted to have resovled  Discharge Instructions   Allergies as of 07/27/2020      Reactions   Sulfa Antibiotics Other (See Comments)   Both parents allergic-so will not take   Adhesive [tape] Itching      Medication List    STOP taking these medications   carvedilol 6.25 MG tablet Commonly known as: COREG   labetalol 200 MG tablet Commonly known as: NORMODYNE   losartan 50 MG tablet Commonly known as: COZAAR     TAKE these medications   acetaminophen 500 MG tablet Commonly known as: TYLENOL Take 1 tablet (500 mg total) by mouth every 6 (six) hours as needed. What changed: reasons to take this   allopurinol 300 MG tablet Commonly known as: ZYLOPRIM Take 150 mg by mouth daily.   ALPRAZolam 0.25 MG tablet Commonly known as: XANAX Take 0.25 mg by mouth daily as needed for anxiety.   aspirin 81 MG EC tablet Take 1 tablet (81 mg total) by mouth daily.   atorvastatin 40 MG tablet Commonly known as: Lipitor Take 1 tablet (40 mg total) by mouth daily.   Auryxia 1 GM 210 MG(Fe) tablet Generic drug: ferric citrate Take 420 mg by mouth 3 (three) times daily with meals.   cinacalcet 30 MG tablet Commonly known as: SENSIPAR Take 30 mg by mouth daily.   Dialyvite 800 0.8 MG Wafr Take 1 tablet by mouth daily.   lactulose 10 GM/15ML solution Commonly known as: CHRONULAC Take 20 g by mouth daily as needed for  mild constipation.   midodrine 5 MG tablet Commonly known as: PROAMATINE Take 1 tablet (5 mg total) by mouth 3 (three) times daily with meals.   ondansetron 8 MG tablet Commonly known as: ZOFRAN Take 8 mg by mouth 2 (two) times daily as needed for nausea or  vomiting.   zolpidem 5 MG tablet Commonly known as: AMBIEN Take 1 tablet (5 mg total) by mouth at bedtime as needed for sleep. What changed:   medication strength  how much to take       Contact information for after-discharge care    Destination    [facility] .   Service: Skilled Nursing Contact information: [place] [phone]                 Allergies  Allergen Reactions  . Sulfa Antibiotics Other (See Comments)    Both parents allergic-so will not take  . Adhesive [Tape] Itching    Consultations:  Nephrology  Procedures/Studies: CT ABDOMEN PELVIS W CONTRAST  Result Date: 07/21/2020 CLINICAL DATA:  Epigastric abdominal pain. EXAM: CT ABDOMEN AND PELVIS WITH CONTRAST TECHNIQUE: Multidetector CT imaging of the abdomen and pelvis was performed using the standard protocol following bolus administration of intravenous contrast. CONTRAST:  130mL OMNIPAQUE IOHEXOL 300 MG/ML  SOLN COMPARISON:  05/15/2016 CT abdomen/pelvis. FINDINGS: Lower chest: Interlobular septal thickening throughout both lung bases. Cardiomegaly. Small dependent bilateral pleural effusions with dependent bibasilar atelectasis. Coronary atherosclerosis. Hepatobiliary: Normal liver size. No liver mass. Cholecystectomy. No biliary ductal dilatation. Pancreas: Normal, with no mass or duct dilation. Spleen: Normal size. No mass. Adrenals/Urinary Tract: Normal adrenals. Severe bilateral renal atrophy. No hydronephrosis. Simple bilateral renal cysts, largest 5.0 cm in interpolar left kidney. Normal collapsed bladder. Stomach/Bowel: Normal non-distended stomach. Normal caliber small bowel with no small bowel wall thickening. Normal appendix. Mild left colonic diverticulosis with no large bowel wall thickening or acute pericolonic fat stranding. Vascular/Lymphatic: Atherosclerotic nonaneurysmal abdominal aorta. Contrast reflux into the IVC and hepatic veins.  Patent portal, splenic, hepatic and renal veins. No pathologically enlarged lymph nodes in the abdomen or pelvis. Reproductive: Stable mildly enlarged myomatous uterus. No adnexal masses. Other: No pneumoperitoneum. No ascites. Soft tissue density 5.0 x 2.7 cm structure in the anterolateral left pelvis (series 3/image 40) along the anterior margin of the left external iliac artery with scattered internal coarse calcifications, unchanged since 05/14/2016 CT, most compatible with a chronic hematoma. Musculoskeletal: No aggressive appearing focal osseous lesions. Severe chronic erosive change at the L4-5 disc, not significantly changed. Prosthesis between the L4 and L5 spinous processes. Renal osteodystrophy. IMPRESSION: 1. Cardiomegaly. Contrast reflux into the IVC and hepatic veins, suggesting right heart failure. Small dependent bilateral pleural effusions. Interlobular septal thickening throughout both lung bases, suggesting mild cardiogenic pulmonary edema. 2. No acute abnormality in the abdomen or pelvis. No evidence of bowel obstruction or acute bowel inflammation. Mild left colonic diverticulosis, with no evidence of acute diverticulitis. 3. Stable suspected chronic hematoma in the anterolateral left pelvis, see comments. 4. Stable mildly enlarged myomatous uterus. 5. Aortic Atherosclerosis (ICD10-I70.0). Electronically Signed   By: Ilona Sorrel M.D.   On: 07/21/2020 13:34   DG Chest Port 1 View  Result Date: 07/21/2020 CLINICAL DATA:  Dyspnea EXAM: PORTABLE CHEST 1 VIEW COMPARISON:  07/05/2020 FINDINGS: Cardiomegaly with increased interstitial markings in the right upper lobe and bilateral lower lobes, favoring interstitial edema over infection. Suspected trace left pleural effusion. No pneumothorax. Thoracic aortic atherosclerosis. IMPRESSION: Cardiomegaly with suspected mild interstitial  edema and small left pleural effusion. Electronically Signed   By: Julian Hy M.D.   On: 07/21/2020 10:35   DG  Chest Port 1 View  Result Date: 07/05/2020 CLINICAL DATA:  Shortness of breath.  Hypoxia. EXAM: PORTABLE CHEST 1 VIEW COMPARISON:  05/31/202, 10/11/2019.  CT 11/20/2019. FINDINGS: Severe cardiomegaly again noted. Prominent central pulmonary arteries again noted suggesting pulmonary hypertension. Diffuse interstitial prominence again noted, increased from prior exam. Kerley B-lines noted. These findings are most consistent with CHF with interstitial edema. Pneumonitis cannot be excluded. No pleural effusion or pneumothorax. IMPRESSION: 1. Severe cardiomegaly again noted. Prominent central pulmonary arteries again noted suggesting pulmonary hypertension. 2. Diffuse interstitial prominence again noted, increased from prior exam. Findings most consistent with CHF with interstitial edema. Pneumonitis cannot be excluded. Electronically Signed   By: Marcello Moores  Register   On: 07/05/2020 05:43     Subjective: Eager to go to rehab  Discharge Exam: Vitals:   07/27/20 0644 07/27/20 0822  BP: (!) 117/48 118/78  Pulse: 76 89  Resp:  14  Temp:  98.5 F (36.9 C)  SpO2:  98%   Vitals:   07/26/20 2000 07/27/20 0531 07/27/20 0644 07/27/20 0822  BP: 97/82 (!) 90/35 (!) 117/48 118/78  Pulse: 76 76 76 89  Resp: 17 16  14   Temp: 98.2 F (36.8 C) 98.4 F (36.9 C)  98.5 F (36.9 C)  TempSrc: Oral Oral  Oral  SpO2: 94% 98%  98%  Weight:  49 kg    Height:        General: Pt is alert, awake, not in acute distress Cardiovascular: RRR, S1/S2 +, no rubs, no gallops Respiratory: CTA bilaterally, no wheezing, no rhonchi Abdominal: Soft, NT, ND, bowel sounds + Extremities: no edema, no cyanosis   The results of significant diagnostics from this hospitalization (including imaging, microbiology, ancillary and laboratory) are listed below for reference.     Microbiology: Recent Results (from the past 240 hour(s))  SARS Coronavirus 2 by RT PCR (hospital order, performed in [facility] hospital lab)  Nasopharyngeal Nasopharyngeal Swab     Status: None   Collection Time: 07/21/20  1:19 PM   Specimen: Nasopharyngeal Swab  Result Value Ref Range Status   SARS Coronavirus 2 NEGATIVE NEGATIVE Final    Comment: (NOTE) SARS-CoV-2 target nucleic acids are NOT DETECTED.  The SARS-CoV-2 RNA is generally detectable in upper and lower respiratory specimens during the acute phase of infection. The lowest concentration of SARS-CoV-2 viral copies this assay can detect is 250 copies / mL. A negative result does not preclude SARS-CoV-2 infection and should not be used as the sole basis for treatment or other patient management decisions.  A negative result may occur with improper specimen collection / handling, submission of specimen other than nasopharyngeal swab, presence of viral mutation(s) within the areas targeted by this assay, and inadequate number of viral copies (<250 copies / mL). A negative result must be combined with clinical observations, patient history, and epidemiological information.  Fact Sheet for Patients:   StrictlyIdeas.no  Fact Sheet for Healthcare Providers: BankingDealers.co.za  This test is not yet approved or  cleared by the [place] FDA and has been authorized for detection and/or diagnosis of SARS-CoV-2 by FDA under an Emergency Use Authorization (EUA).  This EUA will remain in effect (meaning this test can be used) for the duration of the COVID-19 declaration under Section 564(b)(1) of the Act, 21 U.S.C. section 360bbb-3(b)(1), unless the authorization is terminated or revoked sooner.  Performed at [facility], [place] [address], [place], [place] [address]   MRSA PCR Screening     Status: None   Collection Time: 07/23/20 10:57 AM   Specimen: Nasal Mucosa; Nasopharyngeal  Result Value Ref Range Status   MRSA by PCR NEGATIVE NEGATIVE Final    Comment:        The GeneXpert MRSA Assay (FDA approved for  NASAL specimens only), is one component of a comprehensive MRSA colonization surveillance program. It is not intended to diagnose MRSA infection nor to guide or monitor treatment for MRSA infections. Performed at [facility], [place] [address] [place], [place] [address]   SARS CORONAVIRUS 2 (TAT 6-24 HRS) Nasopharyngeal Nasopharyngeal Swab     Status: None   Collection Time: 07/26/20  3:02 PM   Specimen: Nasopharyngeal Swab  Result Value Ref Range Status   SARS Coronavirus 2 NEGATIVE NEGATIVE Final    Comment: (NOTE) SARS-CoV-2 target nucleic acids are NOT DETECTED.  The SARS-CoV-2 RNA is generally detectable in upper and lower respiratory specimens during the acute phase of infection. Negative results do not preclude SARS-CoV-2 infection, do not rule out co-infections with other pathogens, and should not be used as the sole basis for treatment or other patient management decisions. Negative results must be combined with clinical observations, patient history, and epidemiological information. The expected result is Negative.  Fact Sheet for Patients: SugarRoll.be  Fact Sheet for Healthcare Providers: https://www.woods-mathews.com/  This test is not yet approved or cleared by the [place] FDA and  has been authorized for detection and/or diagnosis of SARS-CoV-2 by FDA under an Emergency Use Authorization (EUA). This EUA will remain  in effect (meaning this test can be used) for the duration of the COVID-19 declaration under Se ction 564(b)(1) of the Act, 21 U.S.C. section 360bbb-3(b)(1), unless the authorization is terminated or revoked sooner.  Performed at [facility], [place] [address], [place],  [address]      Labs: BNP (last 3 results) Recent Labs    11/20/19 0430 01/01/20 0539 07/21/20 0950  BNP 3,362.4* 1,664.3* >3,810.1*   Basic Metabolic Panel: Recent Labs  Lab 07/21/20 0950 07/21/20 1119  07/22/20 0615 07/24/20 0642 07/24/20 0643 07/26/20 0920  NA 141 140 137  --  137 134*  K 3.3* 3.5 4.0  --  4.0 3.7  CL 96*  --  97*  --  96* 95*  CO2 26  --  26  --  26 26  GLUCOSE 138*  --  82  --  98 100*  BUN 23  --  13  --  25* 34*  CREATININE 6.10*  --  3.64*  --  5.64* 6.52*  CALCIUM 10.4*  --  10.5*  --  10.2 9.7  MG  --   --   --  2.4  --   --   PHOS  --   --   --   --  2.8 2.2*   Liver Function Tests: Recent Labs  Lab 07/21/20 0950 07/24/20 0643 07/26/20 0920  AST 33  --   --   ALT 17  --   --   ALKPHOS 91  --   --   BILITOT 1.2  --   --   PROT 6.8  --   --   ALBUMIN 3.4* 3.4* 2.8*   Recent Labs  Lab 07/21/20 0950  LIPASE 35   No results for input(s): AMMONIA in the last 168 hours. CBC: Recent  Labs  Lab 07/21/20 0950 07/21/20 1119 07/22/20 0615 07/24/20 0642 07/26/20 0920  WBC 7.4  --  5.6 8.2 7.1  NEUTROABS  --   --   --  5.0  --   HGB 8.8* 8.8* 8.6* 10.0* 8.3*  HCT 27.7* 26.0* 27.0* 32.0* 26.4*  MCV 93.9  --  93.4 97.3 95.3  PLT 204  --  212 269 247   Cardiac Enzymes: No results for input(s): CKTOTAL, CKMB, CKMBINDEX, TROPONINI in the last 168 hours. BNP: Invalid input(s): POCBNP CBG: No results for input(s): GLUCAP in the last 168 hours. D-Dimer No results for input(s): DDIMER in the last 72 hours. Hgb A1c No results for input(s): HGBA1C in the last 72 hours. Lipid Profile No results for input(s): CHOL, HDL, LDLCALC, TRIG, CHOLHDL, LDLDIRECT in the last 72 hours. Thyroid function studies No results for input(s): TSH, T4TOTAL, T3FREE, THYROIDAB in the last 72 hours.  Invalid input(s): FREET3 Anemia work up No results for input(s): VITAMINB12, FOLATE, FERRITIN, TIBC, IRON, RETICCTPCT in the last 72 hours. Urinalysis    Component Value Date/Time   COLORURINE YELLOW 01/30/2011 1648   APPEARANCEUR CLOUDY (A) 01/30/2011 1648   LABSPEC 1.016 01/30/2011 1648   PHURINE 5.0 01/30/2011 1648   GLUCOSEU NEGATIVE 10/14/2009 0747   HGBUR SMALL  (A) 01/30/2011 1648   BILIRUBINUR NEGATIVE 01/30/2011 1648   KETONESUR NEGATIVE 01/30/2011 1648   PROTEINUR 100 (A) 01/30/2011 1648   UROBILINOGEN 0.2 01/30/2011 1648   NITRITE NEGATIVE 01/30/2011 1648   LEUKOCYTESUR SMALL (A) 01/30/2011 1648   Sepsis Labs Invalid input(s): PROCALCITONIN,  WBC,  LACTICIDVEN Microbiology Recent Results (from the past 240 hour(s))  SARS Coronavirus 2 by RT PCR (hospital order, performed in [facility] hospital lab) Nasopharyngeal Nasopharyngeal Swab     Status: None   Collection Time: 07/21/20  1:19 PM   Specimen: Nasopharyngeal Swab  Result Value Ref Range Status   SARS Coronavirus 2 NEGATIVE NEGATIVE Final    Comment: (NOTE) SARS-CoV-2 target nucleic acids are NOT DETECTED.  The SARS-CoV-2 RNA is generally detectable in upper and lower respiratory specimens during the acute phase of infection. The lowest concentration of SARS-CoV-2 viral copies this assay can detect is 250 copies / mL. A negative result does not preclude SARS-CoV-2 infection and should not be used as the sole basis for treatment or other patient management decisions.  A negative result may occur with improper specimen collection / handling, submission of specimen other than nasopharyngeal swab, presence of viral mutation(s) within the areas targeted by this assay, and inadequate number of viral copies (<250 copies / mL). A negative result must be combined with clinical observations, patient history, and epidemiological information.  Fact Sheet for Patients:   StrictlyIdeas.no  Fact Sheet for Healthcare Providers: BankingDealers.co.za  This test is not yet approved or  cleared by the [place] FDA and has been authorized for detection and/or diagnosis of SARS-CoV-2 by FDA under an Emergency Use Authorization (EUA).  This EUA will remain in effect (meaning this test can be used) for the duration of the COVID-19 declaration  under Section 564(b)(1) of the Act, 21 U.S.C. section 360bbb-3(b)(1), unless the authorization is terminated or revoked sooner.  Performed at [facility], [place] [address] [place], [place] [address]   MRSA PCR Screening     Status: None   Collection Time: 07/23/20 10:57 AM   Specimen: Nasal Mucosa; Nasopharyngeal  Result Value Ref Range Status   MRSA by PCR NEGATIVE NEGATIVE Final  Comment:        The GeneXpert MRSA Assay (FDA approved for NASAL specimens only), is one component of a comprehensive MRSA colonization surveillance program. It is not intended to diagnose MRSA infection nor to guide or monitor treatment for MRSA infections. Performed at [facility], [place] [address] [place], [place] [address]   SARS CORONAVIRUS 2 (TAT 6-24 HRS) Nasopharyngeal Nasopharyngeal Swab     Status: None   Collection Time: 07/26/20  3:02 PM   Specimen: Nasopharyngeal Swab  Result Value Ref Range Status   SARS Coronavirus 2 NEGATIVE NEGATIVE Final    Comment: (NOTE) SARS-CoV-2 target nucleic acids are NOT DETECTED.  The SARS-CoV-2 RNA is generally detectable in upper and lower respiratory specimens during the acute phase of infection. Negative results do not preclude SARS-CoV-2 infection, do not rule out co-infections with other pathogens, and should not be used as the sole basis for treatment or other patient management decisions. Negative results must be combined with clinical observations, patient history, and epidemiological information. The expected result is Negative.  Fact Sheet for Patients: SugarRoll.be  Fact Sheet for Healthcare Providers: https://www.woods-mathews.com/  This test is not yet approved or cleared by the [place] FDA and  has been authorized for detection and/or diagnosis of SARS-CoV-2 by FDA under an Emergency Use Authorization (EUA). This EUA will remain  in effect (meaning this test can be used)  for the duration of the COVID-19 declaration under Se ction 564(b)(1) of the Act, 21 U.S.C. section 360bbb-3(b)(1), unless the authorization is terminated or revoked sooner.  Performed at [facility], [place] [address], [place], [place] [address]    Time spent: 30 min  SIGNED:   Marylu Lund, MD  [facility] 07/27/2020, 11:34 AM  If 7PM-7AM, please contact night-coverage

## 2020-07-27 NOTE — TOC Transition Note (Signed)
Transition of Care [facility]) - CM/SW Discharge Note   Patient Details  Name: [name] MRN: [id number] Date of Birth: [birth year]  [facility] CM/SW Contact:  Bary Castilla, LCSW Phone Number: [phone] 07/27/2020, 1:08 PM   Clinical Narrative:    Patient will DC to:?[facility] Anticipated DC date:?07/27/20 Family notified:?[place] Transport by: Corey Harold   Per MD patient ready for DC to [facility] RN, patient, patient's family, and facility notified of DC. Discharge Summary sent to facility. RN given number for report  [phone] room  213. DC packet on chart. Ambulance transport requested for patient.   CSW signing off.   [place], [place] [phone]    Final next level of care: Skilled Nursing Facility Barriers to Discharge: No Barriers Identified   Patient Goals and CMS Choice Patient states their goals for this hospitalization and ongoing recovery are:: SNF CMS Medicare.gov Compare Post Acute Care list provided to:: Patient Choice offered to / list presented to : Patient  Discharge Placement              Patient [facility]: [facility] Patient to be transferred to facility by: [place] Name of family member notified: [name] Patient and family notified of of transfer: 07/27/20  Discharge Plan and Services                                     Social Determinants of Health (SDOH) Interventions     Readmission Risk Interventions No flowsheet data found.

## 2020-07-27 NOTE — Progress Notes (Signed)
Discharge education and medication education given to patient and discharge instructions packet given to [facility] transporters. All questions and concerns answered. Peripheral IV and telemetry leads removed. All patient belongings given to patient and [facility] transporters. Patient transported to [place] via [place].

## 2020-07-29 DIAGNOSIS — N2581 Secondary hyperparathyroidism of renal origin: Secondary | ICD-10-CM | POA: Diagnosis not present

## 2020-07-29 DIAGNOSIS — N186 End stage renal disease: Secondary | ICD-10-CM | POA: Diagnosis not present

## 2020-07-29 DIAGNOSIS — I5033 Acute on chronic diastolic (congestive) heart failure: Secondary | ICD-10-CM | POA: Diagnosis not present

## 2020-07-29 DIAGNOSIS — Z992 Dependence on renal dialysis: Secondary | ICD-10-CM | POA: Diagnosis not present

## 2020-07-29 DIAGNOSIS — E46 Unspecified protein-calorie malnutrition: Secondary | ICD-10-CM | POA: Diagnosis not present

## 2020-07-31 DIAGNOSIS — N2581 Secondary hyperparathyroidism of renal origin: Secondary | ICD-10-CM | POA: Diagnosis not present

## 2020-07-31 DIAGNOSIS — Z992 Dependence on renal dialysis: Secondary | ICD-10-CM | POA: Diagnosis not present

## 2020-07-31 DIAGNOSIS — N186 End stage renal disease: Secondary | ICD-10-CM | POA: Diagnosis not present

## 2020-08-02 DIAGNOSIS — N186 End stage renal disease: Secondary | ICD-10-CM | POA: Diagnosis not present

## 2020-08-02 DIAGNOSIS — N2581 Secondary hyperparathyroidism of renal origin: Secondary | ICD-10-CM | POA: Diagnosis not present

## 2020-08-02 DIAGNOSIS — Z992 Dependence on renal dialysis: Secondary | ICD-10-CM | POA: Diagnosis not present

## 2020-08-05 DIAGNOSIS — Z992 Dependence on renal dialysis: Secondary | ICD-10-CM | POA: Diagnosis not present

## 2020-08-05 DIAGNOSIS — N186 End stage renal disease: Secondary | ICD-10-CM | POA: Diagnosis not present

## 2020-08-05 DIAGNOSIS — N2581 Secondary hyperparathyroidism of renal origin: Secondary | ICD-10-CM | POA: Diagnosis not present

## 2020-08-07 DIAGNOSIS — N186 End stage renal disease: Secondary | ICD-10-CM | POA: Diagnosis not present

## 2020-08-07 DIAGNOSIS — Z992 Dependence on renal dialysis: Secondary | ICD-10-CM | POA: Diagnosis not present

## 2020-08-07 DIAGNOSIS — N2581 Secondary hyperparathyroidism of renal origin: Secondary | ICD-10-CM | POA: Diagnosis not present

## 2020-08-08 DIAGNOSIS — T782XXA Anaphylactic shock, unspecified, initial encounter: Secondary | ICD-10-CM | POA: Insufficient documentation

## 2020-08-08 DIAGNOSIS — T7840XA Allergy, unspecified, initial encounter: Secondary | ICD-10-CM | POA: Insufficient documentation

## 2020-08-09 DIAGNOSIS — Z992 Dependence on renal dialysis: Secondary | ICD-10-CM | POA: Diagnosis not present

## 2020-08-09 DIAGNOSIS — N186 End stage renal disease: Secondary | ICD-10-CM | POA: Diagnosis not present

## 2020-08-09 DIAGNOSIS — K5901 Slow transit constipation: Secondary | ICD-10-CM | POA: Diagnosis not present

## 2020-08-09 DIAGNOSIS — N2581 Secondary hyperparathyroidism of renal origin: Secondary | ICD-10-CM | POA: Diagnosis not present

## 2020-08-09 IMAGING — DX DG CHEST 2V
2 series · 2 of 2 positions shown · non-contrast
Comparison: Radiograph and CT 11/20/2019

CLINICAL DATA: Chest pain. Shortness of breath. Dialysis patient.

EXAM:
CHEST - 2 VIEW

[chest lat]
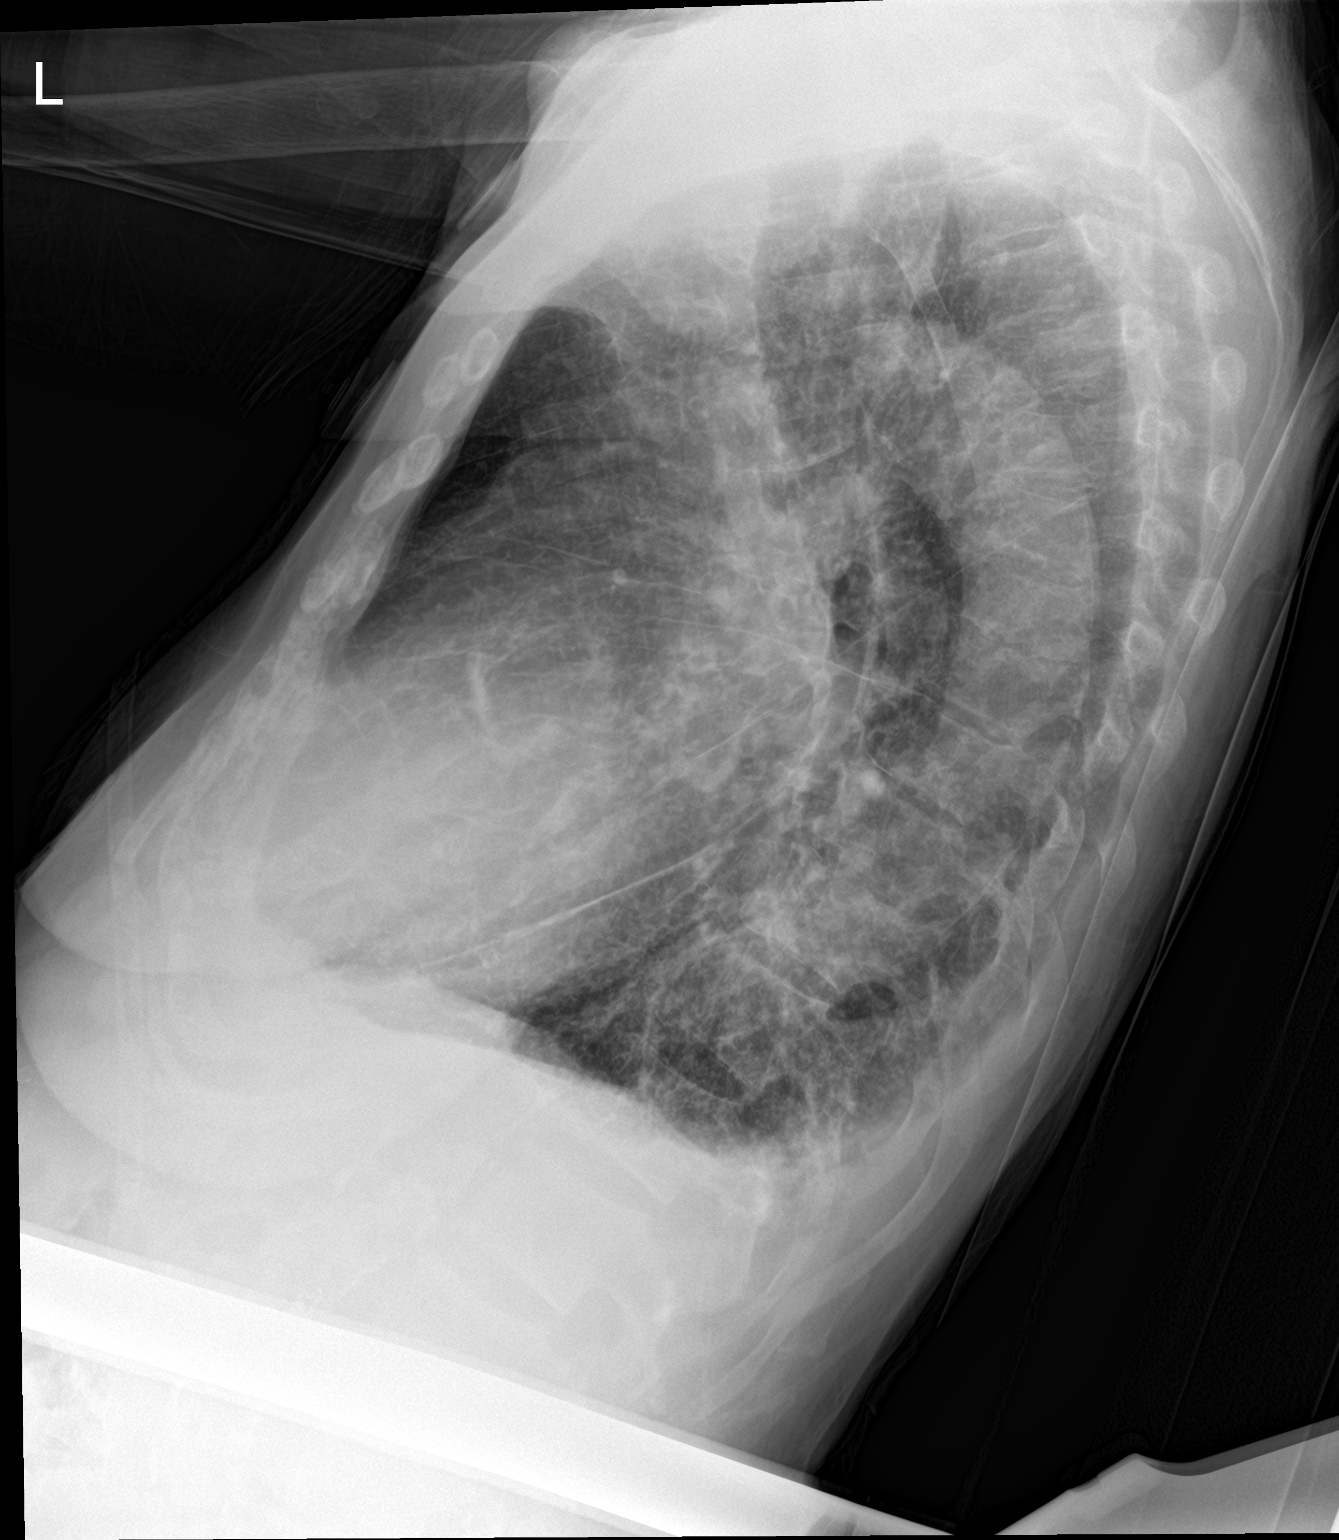

[chest ap]
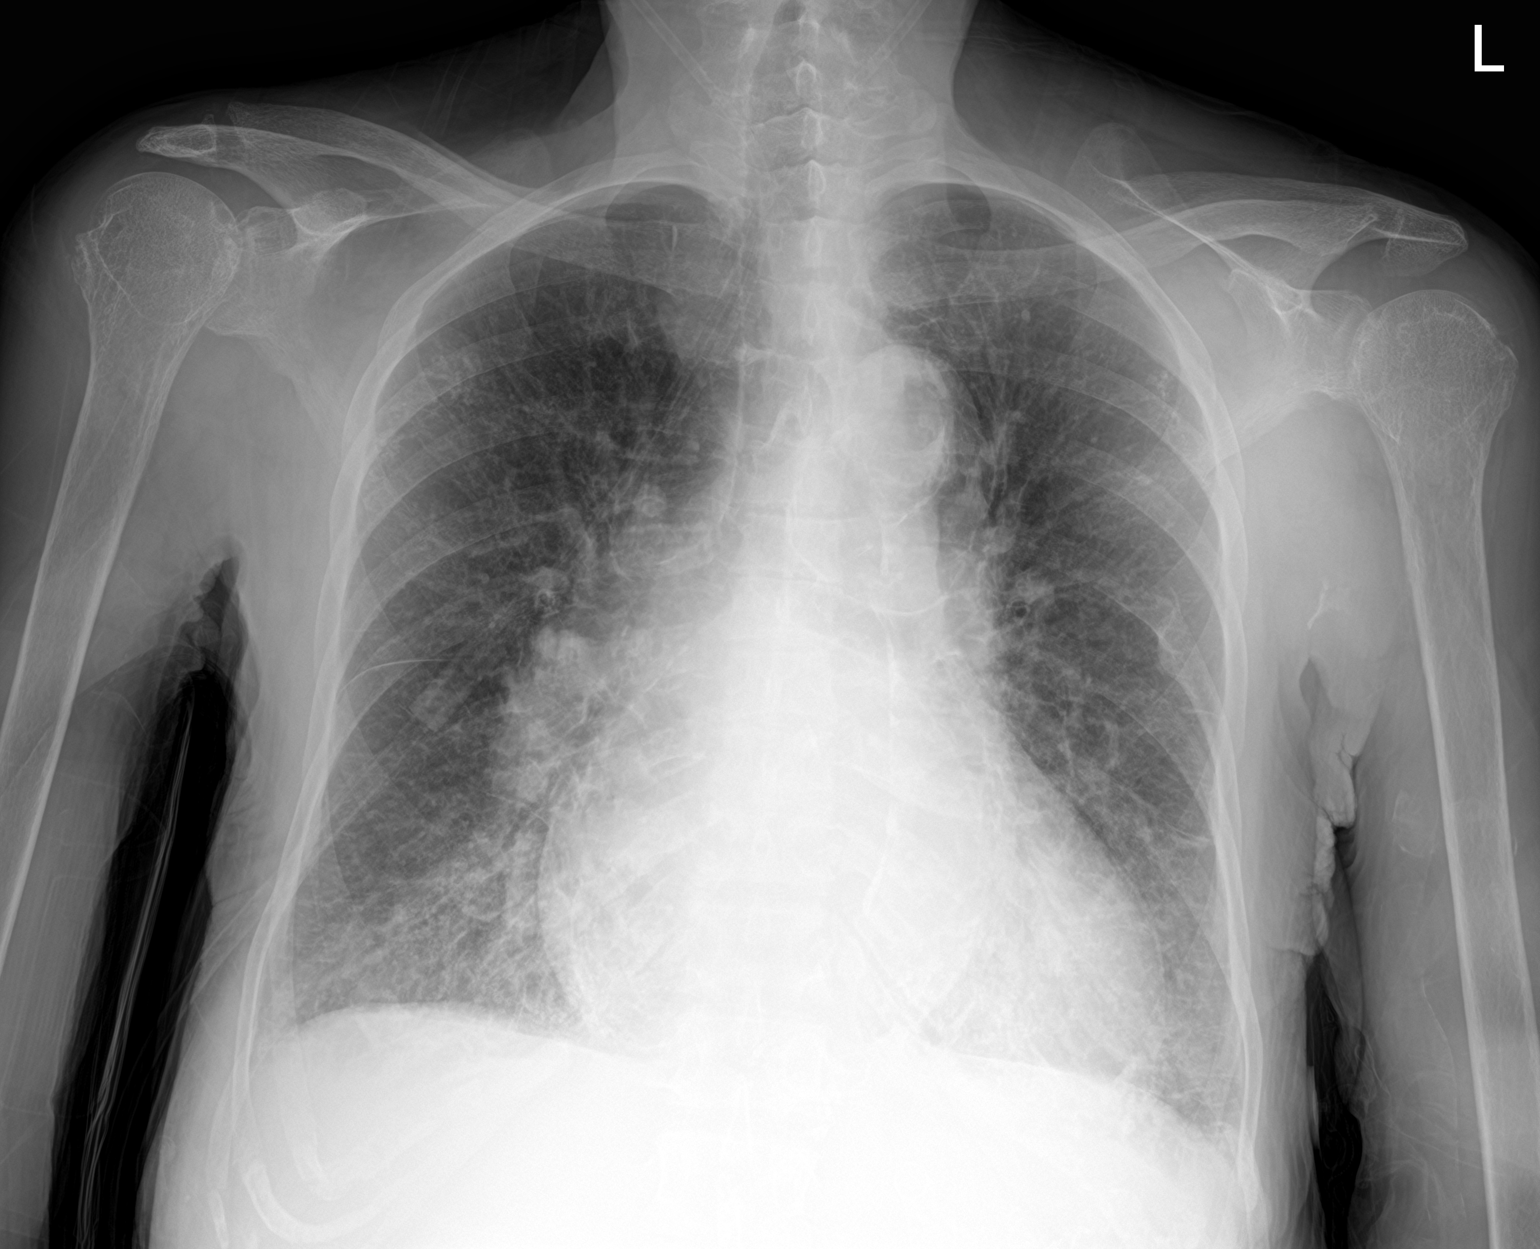

[2 of 2 positions shown; findings below may reference images not displayed]

FINDINGS: Stable cardiomegaly. Aortic atherosclerosis. Worsening pulmonary
edema from prior exam. Small bilateral pleural effusions. No
confluent airspace disease. No pneumothorax. No acute osseous
abnormalities are seen.
IMPRESSION: CHF, worsened since [DATE].

## 2020-08-12 DIAGNOSIS — N2581 Secondary hyperparathyroidism of renal origin: Secondary | ICD-10-CM | POA: Diagnosis not present

## 2020-08-12 DIAGNOSIS — N186 End stage renal disease: Secondary | ICD-10-CM | POA: Diagnosis not present

## 2020-08-12 DIAGNOSIS — Z992 Dependence on renal dialysis: Secondary | ICD-10-CM | POA: Diagnosis not present

## 2020-08-12 DIAGNOSIS — R0989 Other specified symptoms and signs involving the circulatory and respiratory systems: Secondary | ICD-10-CM | POA: Diagnosis not present

## 2020-08-13 DIAGNOSIS — Z992 Dependence on renal dialysis: Secondary | ICD-10-CM | POA: Diagnosis not present

## 2020-08-13 DIAGNOSIS — N186 End stage renal disease: Secondary | ICD-10-CM | POA: Diagnosis not present

## 2020-08-13 DIAGNOSIS — T8612 Kidney transplant failure: Secondary | ICD-10-CM | POA: Diagnosis not present

## 2020-08-14 DIAGNOSIS — I5033 Acute on chronic diastolic (congestive) heart failure: Secondary | ICD-10-CM | POA: Diagnosis not present

## 2020-08-14 DIAGNOSIS — I2510 Atherosclerotic heart disease of native coronary artery without angina pectoris: Secondary | ICD-10-CM | POA: Diagnosis not present

## 2020-08-14 DIAGNOSIS — J811 Chronic pulmonary edema: Secondary | ICD-10-CM | POA: Diagnosis not present

## 2020-08-14 DIAGNOSIS — M150 Primary generalized (osteo)arthritis: Secondary | ICD-10-CM | POA: Diagnosis not present

## 2020-08-14 DIAGNOSIS — I21A1 Myocardial infarction type 2: Secondary | ICD-10-CM | POA: Diagnosis not present

## 2020-08-14 DIAGNOSIS — R531 Weakness: Secondary | ICD-10-CM | POA: Diagnosis not present

## 2020-08-14 DIAGNOSIS — R5381 Other malaise: Secondary | ICD-10-CM | POA: Diagnosis not present

## 2020-08-14 DIAGNOSIS — Z992 Dependence on renal dialysis: Secondary | ICD-10-CM | POA: Diagnosis not present

## 2020-08-14 DIAGNOSIS — E7800 Pure hypercholesterolemia, unspecified: Secondary | ICD-10-CM | POA: Diagnosis not present

## 2020-08-14 DIAGNOSIS — N186 End stage renal disease: Secondary | ICD-10-CM | POA: Diagnosis not present

## 2020-08-14 DIAGNOSIS — N2581 Secondary hyperparathyroidism of renal origin: Secondary | ICD-10-CM | POA: Diagnosis not present

## 2020-08-14 DIAGNOSIS — I509 Heart failure, unspecified: Secondary | ICD-10-CM | POA: Diagnosis not present

## 2020-08-14 DIAGNOSIS — I5023 Acute on chronic systolic (congestive) heart failure: Secondary | ICD-10-CM | POA: Diagnosis not present

## 2020-08-16 DIAGNOSIS — N186 End stage renal disease: Secondary | ICD-10-CM | POA: Diagnosis not present

## 2020-08-16 DIAGNOSIS — Z992 Dependence on renal dialysis: Secondary | ICD-10-CM | POA: Diagnosis not present

## 2020-08-16 DIAGNOSIS — N2581 Secondary hyperparathyroidism of renal origin: Secondary | ICD-10-CM | POA: Diagnosis not present

## 2020-08-19 DIAGNOSIS — N2581 Secondary hyperparathyroidism of renal origin: Secondary | ICD-10-CM | POA: Diagnosis not present

## 2020-08-19 DIAGNOSIS — Z992 Dependence on renal dialysis: Secondary | ICD-10-CM | POA: Diagnosis not present

## 2020-08-19 DIAGNOSIS — N186 End stage renal disease: Secondary | ICD-10-CM | POA: Diagnosis not present

## 2020-08-21 DIAGNOSIS — N186 End stage renal disease: Secondary | ICD-10-CM | POA: Diagnosis not present

## 2020-08-21 DIAGNOSIS — Z992 Dependence on renal dialysis: Secondary | ICD-10-CM | POA: Diagnosis not present

## 2020-08-21 DIAGNOSIS — N2581 Secondary hyperparathyroidism of renal origin: Secondary | ICD-10-CM | POA: Diagnosis not present

## 2020-08-22 DIAGNOSIS — R5381 Other malaise: Secondary | ICD-10-CM | POA: Diagnosis not present

## 2020-08-22 DIAGNOSIS — I2510 Atherosclerotic heart disease of native coronary artery without angina pectoris: Secondary | ICD-10-CM | POA: Diagnosis not present

## 2020-08-22 DIAGNOSIS — N186 End stage renal disease: Secondary | ICD-10-CM | POA: Diagnosis not present

## 2020-08-22 DIAGNOSIS — I5033 Acute on chronic diastolic (congestive) heart failure: Secondary | ICD-10-CM | POA: Diagnosis not present

## 2020-08-23 DIAGNOSIS — N186 End stage renal disease: Secondary | ICD-10-CM | POA: Diagnosis not present

## 2020-08-23 DIAGNOSIS — N2581 Secondary hyperparathyroidism of renal origin: Secondary | ICD-10-CM | POA: Diagnosis not present

## 2020-08-23 DIAGNOSIS — Z992 Dependence on renal dialysis: Secondary | ICD-10-CM | POA: Diagnosis not present

## 2020-08-26 DIAGNOSIS — Z992 Dependence on renal dialysis: Secondary | ICD-10-CM | POA: Diagnosis not present

## 2020-08-26 DIAGNOSIS — N186 End stage renal disease: Secondary | ICD-10-CM | POA: Diagnosis not present

## 2020-08-26 DIAGNOSIS — N2581 Secondary hyperparathyroidism of renal origin: Secondary | ICD-10-CM | POA: Diagnosis not present

## 2020-08-28 DIAGNOSIS — Z992 Dependence on renal dialysis: Secondary | ICD-10-CM | POA: Diagnosis not present

## 2020-08-28 DIAGNOSIS — N2581 Secondary hyperparathyroidism of renal origin: Secondary | ICD-10-CM | POA: Diagnosis not present

## 2020-08-28 DIAGNOSIS — N186 End stage renal disease: Secondary | ICD-10-CM | POA: Diagnosis not present

## 2020-08-29 DIAGNOSIS — N186 End stage renal disease: Secondary | ICD-10-CM | POA: Diagnosis not present

## 2020-08-29 DIAGNOSIS — I5022 Chronic systolic (congestive) heart failure: Secondary | ICD-10-CM | POA: Diagnosis not present

## 2020-08-29 DIAGNOSIS — D631 Anemia in chronic kidney disease: Secondary | ICD-10-CM | POA: Diagnosis not present

## 2020-08-29 DIAGNOSIS — I252 Old myocardial infarction: Secondary | ICD-10-CM | POA: Diagnosis not present

## 2020-08-29 DIAGNOSIS — I480 Paroxysmal atrial fibrillation: Secondary | ICD-10-CM | POA: Diagnosis not present

## 2020-08-29 DIAGNOSIS — E7800 Pure hypercholesterolemia, unspecified: Secondary | ICD-10-CM | POA: Diagnosis not present

## 2020-08-29 DIAGNOSIS — I132 Hypertensive heart and chronic kidney disease with heart failure and with stage 5 chronic kidney disease, or end stage renal disease: Secondary | ICD-10-CM | POA: Diagnosis not present

## 2020-08-29 DIAGNOSIS — E440 Moderate protein-calorie malnutrition: Secondary | ICD-10-CM | POA: Diagnosis not present

## 2020-08-29 DIAGNOSIS — M810 Age-related osteoporosis without current pathological fracture: Secondary | ICD-10-CM | POA: Diagnosis not present

## 2020-08-29 DIAGNOSIS — K219 Gastro-esophageal reflux disease without esophagitis: Secondary | ICD-10-CM | POA: Diagnosis not present

## 2020-08-29 DIAGNOSIS — N2581 Secondary hyperparathyroidism of renal origin: Secondary | ICD-10-CM | POA: Diagnosis not present

## 2020-08-29 DIAGNOSIS — M150 Primary generalized (osteo)arthritis: Secondary | ICD-10-CM | POA: Diagnosis not present

## 2020-08-30 DIAGNOSIS — N186 End stage renal disease: Secondary | ICD-10-CM | POA: Diagnosis not present

## 2020-08-30 DIAGNOSIS — Z992 Dependence on renal dialysis: Secondary | ICD-10-CM | POA: Diagnosis not present

## 2020-08-30 DIAGNOSIS — N2581 Secondary hyperparathyroidism of renal origin: Secondary | ICD-10-CM | POA: Diagnosis not present

## 2020-09-02 DIAGNOSIS — Z992 Dependence on renal dialysis: Secondary | ICD-10-CM | POA: Diagnosis not present

## 2020-09-02 DIAGNOSIS — N2581 Secondary hyperparathyroidism of renal origin: Secondary | ICD-10-CM | POA: Diagnosis not present

## 2020-09-02 DIAGNOSIS — N186 End stage renal disease: Secondary | ICD-10-CM | POA: Diagnosis not present

## 2020-09-03 DIAGNOSIS — M150 Primary generalized (osteo)arthritis: Secondary | ICD-10-CM | POA: Diagnosis not present

## 2020-09-03 DIAGNOSIS — N2581 Secondary hyperparathyroidism of renal origin: Secondary | ICD-10-CM | POA: Diagnosis not present

## 2020-09-03 DIAGNOSIS — N186 End stage renal disease: Secondary | ICD-10-CM | POA: Diagnosis not present

## 2020-09-03 DIAGNOSIS — I132 Hypertensive heart and chronic kidney disease with heart failure and with stage 5 chronic kidney disease, or end stage renal disease: Secondary | ICD-10-CM | POA: Diagnosis not present

## 2020-09-03 DIAGNOSIS — E440 Moderate protein-calorie malnutrition: Secondary | ICD-10-CM | POA: Diagnosis not present

## 2020-09-03 DIAGNOSIS — M810 Age-related osteoporosis without current pathological fracture: Secondary | ICD-10-CM | POA: Diagnosis not present

## 2020-09-03 DIAGNOSIS — I480 Paroxysmal atrial fibrillation: Secondary | ICD-10-CM | POA: Diagnosis not present

## 2020-09-03 DIAGNOSIS — I5022 Chronic systolic (congestive) heart failure: Secondary | ICD-10-CM | POA: Diagnosis not present

## 2020-09-03 DIAGNOSIS — D631 Anemia in chronic kidney disease: Secondary | ICD-10-CM | POA: Diagnosis not present

## 2020-09-04 DIAGNOSIS — N186 End stage renal disease: Secondary | ICD-10-CM | POA: Diagnosis not present

## 2020-09-04 DIAGNOSIS — N2581 Secondary hyperparathyroidism of renal origin: Secondary | ICD-10-CM | POA: Diagnosis not present

## 2020-09-04 DIAGNOSIS — Z992 Dependence on renal dialysis: Secondary | ICD-10-CM | POA: Diagnosis not present

## 2020-09-06 DIAGNOSIS — N186 End stage renal disease: Secondary | ICD-10-CM | POA: Diagnosis not present

## 2020-09-06 DIAGNOSIS — Z992 Dependence on renal dialysis: Secondary | ICD-10-CM | POA: Diagnosis not present

## 2020-09-06 DIAGNOSIS — N2581 Secondary hyperparathyroidism of renal origin: Secondary | ICD-10-CM | POA: Diagnosis not present

## 2020-09-09 DIAGNOSIS — Z992 Dependence on renal dialysis: Secondary | ICD-10-CM | POA: Diagnosis not present

## 2020-09-09 DIAGNOSIS — N186 End stage renal disease: Secondary | ICD-10-CM | POA: Diagnosis not present

## 2020-09-09 DIAGNOSIS — N2581 Secondary hyperparathyroidism of renal origin: Secondary | ICD-10-CM | POA: Diagnosis not present

## 2020-09-11 DIAGNOSIS — N186 End stage renal disease: Secondary | ICD-10-CM | POA: Diagnosis not present

## 2020-09-11 DIAGNOSIS — N2581 Secondary hyperparathyroidism of renal origin: Secondary | ICD-10-CM | POA: Diagnosis not present

## 2020-09-11 DIAGNOSIS — Z992 Dependence on renal dialysis: Secondary | ICD-10-CM | POA: Diagnosis not present

## 2020-09-12 DIAGNOSIS — N186 End stage renal disease: Secondary | ICD-10-CM | POA: Diagnosis not present

## 2020-09-12 DIAGNOSIS — I480 Paroxysmal atrial fibrillation: Secondary | ICD-10-CM | POA: Diagnosis not present

## 2020-09-12 DIAGNOSIS — N2581 Secondary hyperparathyroidism of renal origin: Secondary | ICD-10-CM | POA: Diagnosis not present

## 2020-09-12 DIAGNOSIS — D631 Anemia in chronic kidney disease: Secondary | ICD-10-CM | POA: Diagnosis not present

## 2020-09-12 DIAGNOSIS — E440 Moderate protein-calorie malnutrition: Secondary | ICD-10-CM | POA: Diagnosis not present

## 2020-09-12 DIAGNOSIS — M810 Age-related osteoporosis without current pathological fracture: Secondary | ICD-10-CM | POA: Diagnosis not present

## 2020-09-12 DIAGNOSIS — Z992 Dependence on renal dialysis: Secondary | ICD-10-CM | POA: Diagnosis not present

## 2020-09-12 DIAGNOSIS — M150 Primary generalized (osteo)arthritis: Secondary | ICD-10-CM | POA: Diagnosis not present

## 2020-09-12 DIAGNOSIS — T8612 Kidney transplant failure: Secondary | ICD-10-CM | POA: Diagnosis not present

## 2020-09-12 DIAGNOSIS — I5022 Chronic systolic (congestive) heart failure: Secondary | ICD-10-CM | POA: Diagnosis not present

## 2020-09-12 DIAGNOSIS — I132 Hypertensive heart and chronic kidney disease with heart failure and with stage 5 chronic kidney disease, or end stage renal disease: Secondary | ICD-10-CM | POA: Diagnosis not present

## 2020-09-13 DIAGNOSIS — Z992 Dependence on renal dialysis: Secondary | ICD-10-CM | POA: Diagnosis not present

## 2020-09-13 DIAGNOSIS — N2581 Secondary hyperparathyroidism of renal origin: Secondary | ICD-10-CM | POA: Diagnosis not present

## 2020-09-13 DIAGNOSIS — N186 End stage renal disease: Secondary | ICD-10-CM | POA: Diagnosis not present

## 2020-09-16 DIAGNOSIS — Z992 Dependence on renal dialysis: Secondary | ICD-10-CM | POA: Diagnosis not present

## 2020-09-16 DIAGNOSIS — N2581 Secondary hyperparathyroidism of renal origin: Secondary | ICD-10-CM | POA: Diagnosis not present

## 2020-09-16 DIAGNOSIS — N186 End stage renal disease: Secondary | ICD-10-CM | POA: Diagnosis not present

## 2020-09-17 DIAGNOSIS — D631 Anemia in chronic kidney disease: Secondary | ICD-10-CM | POA: Diagnosis not present

## 2020-09-17 DIAGNOSIS — I5022 Chronic systolic (congestive) heart failure: Secondary | ICD-10-CM | POA: Diagnosis not present

## 2020-09-17 DIAGNOSIS — M810 Age-related osteoporosis without current pathological fracture: Secondary | ICD-10-CM | POA: Diagnosis not present

## 2020-09-17 DIAGNOSIS — N2581 Secondary hyperparathyroidism of renal origin: Secondary | ICD-10-CM | POA: Diagnosis not present

## 2020-09-17 DIAGNOSIS — I480 Paroxysmal atrial fibrillation: Secondary | ICD-10-CM | POA: Diagnosis not present

## 2020-09-17 DIAGNOSIS — I132 Hypertensive heart and chronic kidney disease with heart failure and with stage 5 chronic kidney disease, or end stage renal disease: Secondary | ICD-10-CM | POA: Diagnosis not present

## 2020-09-17 DIAGNOSIS — N186 End stage renal disease: Secondary | ICD-10-CM | POA: Diagnosis not present

## 2020-09-17 DIAGNOSIS — E440 Moderate protein-calorie malnutrition: Secondary | ICD-10-CM | POA: Diagnosis not present

## 2020-09-17 DIAGNOSIS — M150 Primary generalized (osteo)arthritis: Secondary | ICD-10-CM | POA: Diagnosis not present

## 2020-09-18 DIAGNOSIS — Z992 Dependence on renal dialysis: Secondary | ICD-10-CM | POA: Diagnosis not present

## 2020-09-18 DIAGNOSIS — N186 End stage renal disease: Secondary | ICD-10-CM | POA: Diagnosis not present

## 2020-09-18 DIAGNOSIS — N2581 Secondary hyperparathyroidism of renal origin: Secondary | ICD-10-CM | POA: Diagnosis not present

## 2020-09-20 DIAGNOSIS — N186 End stage renal disease: Secondary | ICD-10-CM | POA: Diagnosis not present

## 2020-09-20 DIAGNOSIS — Z992 Dependence on renal dialysis: Secondary | ICD-10-CM | POA: Diagnosis not present

## 2020-09-20 DIAGNOSIS — N2581 Secondary hyperparathyroidism of renal origin: Secondary | ICD-10-CM | POA: Diagnosis not present

## 2020-09-23 ENCOUNTER — Telehealth: Payer: Self-pay | Admitting: Cardiology

## 2020-09-23 DIAGNOSIS — N2581 Secondary hyperparathyroidism of renal origin: Secondary | ICD-10-CM | POA: Diagnosis not present

## 2020-09-23 DIAGNOSIS — N186 End stage renal disease: Secondary | ICD-10-CM | POA: Diagnosis not present

## 2020-09-23 DIAGNOSIS — Z992 Dependence on renal dialysis: Secondary | ICD-10-CM | POA: Diagnosis not present

## 2020-09-23 NOTE — Telephone Encounter (Signed)
That is ok with me. Gerald Stabs

## 2020-09-23 NOTE — Telephone Encounter (Signed)
[name] is calling for [place] requesting a provider switch to [name] from Dr. [name] so she can continue care in [place] due to her condition worsening and not wanting her to have to travel to [facility] for appointments.

## 2020-09-25 ENCOUNTER — Telehealth: Payer: Self-pay

## 2020-09-25 DIAGNOSIS — N186 End stage renal disease: Secondary | ICD-10-CM | POA: Diagnosis not present

## 2020-09-25 DIAGNOSIS — N2581 Secondary hyperparathyroidism of renal origin: Secondary | ICD-10-CM | POA: Diagnosis not present

## 2020-09-25 DIAGNOSIS — Z992 Dependence on renal dialysis: Secondary | ICD-10-CM | POA: Diagnosis not present

## 2020-09-25 NOTE — Telephone Encounter (Signed)
NOTES ON FILE FROM [facility] [phone], SENT REFERRAL TO [place]

## 2020-09-27 DIAGNOSIS — Z992 Dependence on renal dialysis: Secondary | ICD-10-CM | POA: Diagnosis not present

## 2020-09-27 DIAGNOSIS — N2581 Secondary hyperparathyroidism of renal origin: Secondary | ICD-10-CM | POA: Diagnosis not present

## 2020-09-27 DIAGNOSIS — N186 End stage renal disease: Secondary | ICD-10-CM | POA: Diagnosis not present

## 2020-09-30 DIAGNOSIS — N2581 Secondary hyperparathyroidism of renal origin: Secondary | ICD-10-CM | POA: Diagnosis not present

## 2020-09-30 DIAGNOSIS — Z992 Dependence on renal dialysis: Secondary | ICD-10-CM | POA: Diagnosis not present

## 2020-09-30 DIAGNOSIS — N186 End stage renal disease: Secondary | ICD-10-CM | POA: Diagnosis not present

## 2020-10-02 DIAGNOSIS — N186 End stage renal disease: Secondary | ICD-10-CM | POA: Diagnosis not present

## 2020-10-02 DIAGNOSIS — N2581 Secondary hyperparathyroidism of renal origin: Secondary | ICD-10-CM | POA: Diagnosis not present

## 2020-10-02 DIAGNOSIS — Z992 Dependence on renal dialysis: Secondary | ICD-10-CM | POA: Diagnosis not present

## 2020-10-03 ENCOUNTER — Ambulatory Visit: Payer: Medicare Other | Admitting: Cardiology

## 2020-10-04 DIAGNOSIS — N2581 Secondary hyperparathyroidism of renal origin: Secondary | ICD-10-CM | POA: Diagnosis not present

## 2020-10-04 DIAGNOSIS — N186 End stage renal disease: Secondary | ICD-10-CM | POA: Diagnosis not present

## 2020-10-04 DIAGNOSIS — Z992 Dependence on renal dialysis: Secondary | ICD-10-CM | POA: Diagnosis not present

## 2020-10-07 ENCOUNTER — Encounter (HOSPITAL_COMMUNITY): Payer: Self-pay

## 2020-10-07 ENCOUNTER — Other Ambulatory Visit: Payer: Self-pay

## 2020-10-07 ENCOUNTER — Emergency Department (HOSPITAL_COMMUNITY): Payer: Medicare HMO

## 2020-10-07 ENCOUNTER — Emergency Department (HOSPITAL_COMMUNITY)
Admission: EM | Admit: 2020-10-07 | Discharge: 2020-10-07 | Disposition: A | Discharge status: Home / Self Care | Payer: Medicare HMO | Attending: Emergency Medicine | Admitting: Emergency Medicine

## 2020-10-07 DIAGNOSIS — N186 End stage renal disease: Secondary | ICD-10-CM | POA: Diagnosis not present

## 2020-10-07 DIAGNOSIS — Z992 Dependence on renal dialysis: Secondary | ICD-10-CM | POA: Diagnosis not present

## 2020-10-07 DIAGNOSIS — R197 Diarrhea, unspecified: Secondary | ICD-10-CM | POA: Diagnosis not present

## 2020-10-07 DIAGNOSIS — I5033 Acute on chronic diastolic (congestive) heart failure: Secondary | ICD-10-CM | POA: Insufficient documentation

## 2020-10-07 DIAGNOSIS — R112 Nausea with vomiting, unspecified: Secondary | ICD-10-CM | POA: Diagnosis not present

## 2020-10-07 DIAGNOSIS — Z87891 Personal history of nicotine dependence: Secondary | ICD-10-CM | POA: Insufficient documentation

## 2020-10-07 DIAGNOSIS — Z8673 Personal history of transient ischemic attack (TIA), and cerebral infarction without residual deficits: Secondary | ICD-10-CM | POA: Insufficient documentation

## 2020-10-07 DIAGNOSIS — Z79899 Other long term (current) drug therapy: Secondary | ICD-10-CM | POA: Diagnosis not present

## 2020-10-07 DIAGNOSIS — E875 Hyperkalemia: Secondary | ICD-10-CM

## 2020-10-07 DIAGNOSIS — R52 Pain, unspecified: Secondary | ICD-10-CM | POA: Diagnosis not present

## 2020-10-07 DIAGNOSIS — R109 Unspecified abdominal pain: Secondary | ICD-10-CM | POA: Diagnosis not present

## 2020-10-07 DIAGNOSIS — I132 Hypertensive heart and chronic kidney disease with heart failure and with stage 5 chronic kidney disease, or end stage renal disease: Secondary | ICD-10-CM | POA: Diagnosis not present

## 2020-10-07 DIAGNOSIS — R0902 Hypoxemia: Secondary | ICD-10-CM | POA: Diagnosis not present

## 2020-10-07 DIAGNOSIS — Z20822 Contact with and (suspected) exposure to covid-19: Secondary | ICD-10-CM | POA: Insufficient documentation

## 2020-10-07 DIAGNOSIS — I2510 Atherosclerotic heart disease of native coronary artery without angina pectoris: Secondary | ICD-10-CM | POA: Insufficient documentation

## 2020-10-07 DIAGNOSIS — Z7982 Long term (current) use of aspirin: Secondary | ICD-10-CM | POA: Diagnosis not present

## 2020-10-07 DIAGNOSIS — R5381 Other malaise: Secondary | ICD-10-CM | POA: Diagnosis not present

## 2020-10-07 DIAGNOSIS — I10 Essential (primary) hypertension: Secondary | ICD-10-CM | POA: Diagnosis not present

## 2020-10-07 LAB — CBC WITH DIFFERENTIAL/PLATELET
Abs Immature Granulocytes: 0.03 10*3/uL (ref 0.00–0.07)
Basophils Absolute: 0 10*3/uL (ref 0.0–0.1)
Basophils Relative: 0 %
Eosinophils Absolute: 0.1 10*3/uL (ref 0.0–0.5)
Eosinophils Relative: 2 %
HCT: 31.8 % — ABNORMAL LOW (ref 36.0–46.0)
Hemoglobin: 9.9 g/dL — ABNORMAL LOW (ref 12.0–15.0)
Immature Granulocytes: 0 %
Lymphocytes Relative: 21 %
Lymphs Abs: 1.5 10*3/uL (ref 0.7–4.0)
MCH: 29.5 pg (ref 26.0–34.0)
MCHC: 31.1 g/dL (ref 30.0–36.0)
MCV: 94.6 fL (ref 80.0–100.0)
Monocytes Absolute: 0.9 10*3/uL (ref 0.1–1.0)
Monocytes Relative: 13 %
Neutro Abs: 4.6 10*3/uL (ref 1.7–7.7)
Neutrophils Relative %: 64 %
Platelets: 179 10*3/uL (ref 150–400)
RBC: 3.36 MIL/uL — ABNORMAL LOW (ref 3.87–5.11)
RDW: 16.6 % — ABNORMAL HIGH (ref 11.5–15.5)
WBC: 7.2 10*3/uL (ref 4.0–10.5)
nRBC: 0 % (ref 0.0–0.2)

## 2020-10-07 LAB — COMPREHENSIVE METABOLIC PANEL
ALT: 13 U/L (ref 0–44)
AST: 29 U/L (ref 15–41)
Albumin: 3.2 g/dL — ABNORMAL LOW (ref 3.5–5.0)
Alkaline Phosphatase: 130 U/L — ABNORMAL HIGH (ref 38–126)
Anion gap: 18 — ABNORMAL HIGH (ref 5–15)
BUN: 63 mg/dL — ABNORMAL HIGH (ref 8–23)
CO2: 25 mmol/L (ref 22–32)
Calcium: 9.2 mg/dL (ref 8.9–10.3)
Chloride: 96 mmol/L — ABNORMAL LOW (ref 98–111)
Creatinine, Ser: 8.17 mg/dL — ABNORMAL HIGH (ref 0.44–1.00)
GFR, Estimated: 5 mL/min — ABNORMAL LOW (ref >60)
Glucose, Bld: 87 mg/dL (ref 70–99)
Potassium: 6.3 mmol/L (ref 3.5–5.1)
Sodium: 139 mmol/L (ref 135–145)
Total Bilirubin: 1.5 mg/dL — ABNORMAL HIGH (ref 0.3–1.2)
Total Protein: 7.1 g/dL (ref 6.5–8.1)

## 2020-10-07 LAB — RESPIRATORY PANEL BY RT PCR (FLU A&B, COVID)
Influenza A by PCR: NEGATIVE
Influenza B by PCR: NEGATIVE
SARS Coronavirus 2 by RT PCR: NEGATIVE

## 2020-10-07 LAB — MAGNESIUM: Magnesium: 2.5 mg/dL — ABNORMAL HIGH (ref 1.7–2.4)

## 2020-10-07 MED ORDER — SODIUM ZIRCONIUM CYCLOSILICATE 10 G PO PACK
10.0000 g | PACK | Freq: Once | ORAL | Status: AC
  Administered 2020-10-07: 10 g via ORAL
  Filled 2020-10-07: qty 1

## 2020-10-07 MED ORDER — LIDOCAINE VISCOUS HCL 2 % MT SOLN
15.0000 mL | Freq: Once | OROMUCOSAL | Status: AC
  Administered 2020-10-07: 15 mL via ORAL
  Filled 2020-10-07: qty 15

## 2020-10-07 MED ORDER — ALUM & MAG HYDROXIDE-SIMETH 200-200-20 MG/5ML PO SUSP
30.0000 mL | Freq: Once | ORAL | Status: AC
  Administered 2020-10-07: 30 mL via ORAL
  Filled 2020-10-07: qty 30

## 2020-10-07 NOTE — ED Notes (Signed)
Pt given a bagged lunch, will reassess once finished.

## 2020-10-07 NOTE — Progress Notes (Addendum)
[facility] Renal Quick Note:  Notified by ED and renal navigator that [name] is in ED with abdominal pain, N/V, and hyperkalemia - K 6.3. She is due for dialysis today. She is currently missing her outpatient treatment time.  No respiratory distress - on room air. Vitals stable, afebrile. COVID testing negative. Hgb 9.9. RUS with dilated bile duct but Hx cholecystectomy.  Will place orders to dialyze here today. After finishes, will sent her back to the ED with plan to discharge home from there.  Veneta Penton, PA-C [employer] Pager [phone]  Pt seen, examined and agree w A/P as above.  Kelly Splinter  MD 10/07/2020, 3:57 PM

## 2020-10-07 NOTE — ED Provider Notes (Signed)
[facility] EMERGENCY DEPARTMENT Provider Note   CSN: [id number] Arrival date & time: 10/07/20  9735     History Chief Complaint  Patient presents with  . Abdominal Pain    [name] is a 68 y.o. female.  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  Patient states she has felt slightly nauseated since Friday but states this is generally normal for her after her dialysis treatments.  She endorses that she had some intense abdominal cramping yesterday evening and passed some gas which then resulted in frequent liquid stools.  She states the stool smells but denies seeing any blood, dark tarry stools, mucus.  She denies recent antibiotic use or history of C. difficile.  Patient denies fevers, chills, vomiting or dysuria.  She states she received her full dialysis treatment on Friday and denies orthopnea, shortness of breath, chest pain, bilateral leg swelling.  She explains he domino cramping is has gone away but described them as a sharp sensation in her lower abdomen.  Patient denies any alleviating or aggravating factors at this time.  Patient denies headache, fever, chills, shortness of breath, chest pain, vomiting, dysuria, worsening pedal edema.  Past Medical History:  Diagnosis Date  . Adenomatous polyp of colon 10/2010, 2006, 2015  . Anemia in CKD (chronic kidney disease) 11/07/2012   s/p blood transfusion.   . Arthritis   . CAD (coronary artery disease)    "something like that"  . CHF (congestive heart failure) ([place])   . Constipation   . Depression with anxiety   . Diverticula, colon   . 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  Had some early rejection then stable function for  years, then had slow decline of function and went back on hemodialysis in 2012.  Gets HD TTS schedule at [facility] on [facility] still using L forearm AVF.     Marland Kitchen GERD (gastroesophageal reflux disease)   . GI bleed 2017   felt to be ischemic colitis, last colo 2015  . Headache   . Hyperlipidemia   . Hypertension   . Neurologic gait dysfunction   . Neuromuscular disorder (HCC)    neuropathy hand and legs  . Osteoporosis   . Pneumonia   . 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 Pulmonary edema 12/2019  . Stroke [facility]) 11/2015   TIA  . Weight loss, unintentional     Patient Active Problem List   Diagnosis Date Noted  . NSTEMI (non-ST elevated myocardial infarction) ([place]) 05/14/2020  . Volume overload 05/13/2020  . Hypotension after procedure 03/16/2020  . Chest pain 03/16/2020  . Change in mental state 03/15/2020  . Ventricular tachycardia ([place]) 03/15/2020  . Seizure ([place]) 03/15/2020  . Unresponsive episode 03/15/2020  . Pulmonary edema 01/01/2020  . Complication of vascular access for dialysis 12/21/2019  . Breakdown (mechanical) of surgically created arteriovenous fistula, initial encounter ([place]) 12/18/2019  . Weakness 10/11/2019  . 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 Dependence on renal dialysis ([place]) 07/24/2019  . Iron deficiency anemia, unspecified 06/09/2019  . Age-related osteoporosis without current pathological fracture 04/17/2019  . 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 Coagulation defect, unspecified ([place]) 04/17/2019  .  Diarrhea, unspecified 04/17/2019  . Encounter for screening for depression 04/17/2019  . Headache, unspecified 04/17/2019  . 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 Pain, unspecified 04/17/2019  . Polyp of colon 04/17/2019  . Primary generalized (osteo)arthritis 04/17/2019  . Pruritus, unspecified 04/17/2019  . Pure hypercholesterolemia, unspecified 04/17/2019  . Secondary  hyperparathyroidism of renal origin ([place]) 04/17/2019  . Gastro-esophageal reflux disease without esophagitis 04/17/2019  . Essential (primary) hypertension 04/17/2019  . Hyperlipidemia, unspecified 04/17/2019  . Transient cerebral ischemic attack, unspecified 04/17/2019  . Hypoxemia 12/13/2018  . Atrial fibrillation ([place]) 03/23/2017  . Prolonged Q-T interval on ECG 02/24/2017  . Hypokalemia 02/24/2017  . Acute ischemic stroke ([place]) 02/23/2017  . Malnutrition of moderate degree 12/24/2016  . Problem with dialysis access ([place]) 12/21/2016  . Acute ischemic colitis ([place]) 05/16/2016  . Colitis 05/15/2016  . Rectal bleeding 05/15/2016  . Neurologic abnormality 11/19/2015  . Hyperkalemia 11/19/2015  . Anxiety 11/19/2015  . Insomnia 11/19/2015  . Gait instability   . Stroke-like symptom 11/16/2015  . Vestibular neuritis 11/16/2015  . Stroke (cerebrum) ([place]) 11/16/2015  . Dizziness 05/09/2015  . Ataxia 05/09/2015  . H/O: CVA (cerebrovascular accident) 05/09/2015  . Left facial numbness 05/09/2015  . Left leg numbness 05/09/2015  . Hyperlipidemia   . Acute on chronic diastolic heart failure ([place])   . Shortness of breath 04/01/2013  . HTN (hypertension) 04/01/2013  . Cholecystitis, acute 11/07/2012  . End-stage renal disease on hemodialysis ([place]) 11/07/2012  . Anemia in chronic kidney disease 11/07/2012    Past Surgical History:  Procedure Laterality Date  . AV FISTULA PLACEMENT     for dialysis  . AV FISTULA PLACEMENT Left 11/22/2015   Procedure: ARTERIOVENOUS (AV) FISTULA CREATION-LEFT BRACHIOCEPHALIC;  Surgeon: Serafina Mitchell, MD;  Location: [place];  Service: Vascular;  Laterality: Left;  . AV FISTULA PLACEMENT Right 03/15/2020   Procedure: INSERTION OF ARTERIOVENOUS (AV) GORE-TEX GRAFT ARM ( BRACHIAL AXILLARY );  Surgeon: Katha Cabal, MD;  Location: [facility];  Service: Vascular;  Laterality: Right;  . BACK SURGERY    . CERVICAL FUSION    . CHOLECYSTECTOMY  12/02/2012    Procedure: LAPAROSCOPIC CHOLECYSTECTOMY WITH INTRAOPERATIVE CHOLANGIOGRAM;  Surgeon: Edward Jolly, MD;  Location: [place];  Service: General;  Laterality: N/A;  . EYE SURGERY Bilateral    cataract surgery  . EYE SURGERY Left 2019   laser  . HEMATOMA EVACUATION Left 12/24/2016   Procedure: EVACUATION HEMATOMA LEFT UPPER ARM;  Surgeon: Waynetta Sandy, MD;  Location: [place];  Service: Vascular;  Laterality: Left;  . I & D EXTREMITY Left 12/31/2016   Procedure: IRRIGATION AND DEBRIDEMENT EXTREMITY;  Surgeon: Angelia Mould, MD;  Location: [place];  Service: Vascular;  Laterality: Left;  . INSERTION OF DIALYSIS CATHETER Right 12/24/2016   Procedure: INSERTION OF DIALYSIS CATHETER;  Surgeon: Waynetta Sandy, MD;  Location: [place];  Service: Vascular;  Laterality: Right;  . INSERTION OF DIALYSIS CATHETER Right 02/04/2017   Procedure: INSERTION OF DIALYSIS CATHETER;  Surgeon: Waynetta Sandy, MD;  Location: [place];  Service: Vascular;  Laterality: Right;  . KIDNEY TRANSPLANT  1996  . PERIPHERAL VASCULAR CATHETERIZATION Left 10/23/2016   Procedure: Fistulagram;  Surgeon: Elam Dutch, MD;  Location: [place] [facility];  Service: Cardiovascular;  Laterality: Left;  . PERIPHERAL VASCULAR THROMBECTOMY Right 04/16/2020   Procedure: PERIPHERAL VASCULAR THROMBECTOMY;  Surgeon: Katha Cabal, MD;  Location: [place] CV LAB;  Service: Cardiovascular;  Laterality: Right;  .  RESECTION OF ARTERIOVENOUS FISTULA ANEURYSM Left 11/22/2015   Procedure: RESECTION OF LEFT RADIOCEPHALIC FISTULA ANEURYSM ;  Surgeon: Serafina Mitchell, MD;  Location: [place];  Service: Vascular;  Laterality: Left;  . REVISON OF ARTERIOVENOUS FISTULA Left 12/22/2016   Procedure: REVISON OF LEFT ARTERIOVENOUS FISTULA;  Surgeon: Waynetta Sandy, MD;  Location: [place];  Service: Vascular;  Laterality: Left;  . REVISON OF ARTERIOVENOUS FISTULA Left 02/04/2017   Procedure: REVISON OF LEFT UPPER ARM  ARTERIOVENOUS FISTULA;  Surgeon: Waynetta Sandy, MD;  Location: [place];  Service: Vascular;  Laterality: Left;  . UPPER EXTREMITY ANGIOGRAPHY Bilateral 09/19/2019   Procedure: UPPER EXTREMITY ANGIOGRAPHY;  Surgeon: Katha Cabal, MD;  Location: [place] CV LAB;  Service: Cardiovascular;  Laterality: Bilateral;     OB History   No obstetric history on file.     Family History  Problem Relation Age of Onset  . Colon cancer Brother   . Cancer Brother   . Coronary artery disease Mother 18  . Hyperlipidemia Mother   . Hypertension Mother   . Stroke Maternal Aunt   . Esophageal cancer Neg Hx   . Stomach cancer Neg Hx   . Rectal cancer Neg Hx     Social History   Tobacco Use  . Smoking status: Former Smoker    Types: Cigarettes    Quit date: 12/31/1991    Years since quitting: 28.7  . Smokeless tobacco: Never Used  Vaping Use  . Vaping Use: Never used  Substance Use Topics  . Alcohol use: No    Alcohol/week: 0.0 standard drinks  . Drug use: No    Home Medications Prior to Admission medications   Medication Sig Start Date End Date Taking? Authorizing Provider  acetaminophen (TYLENOL) 500 MG tablet Take 1 tablet (500 mg total) by mouth every 6 (six) hours as needed. Patient taking differently: Take 500 mg by mouth every 6 (six) hours as needed for mild pain.  09/05/18  Yes Law, Bea Graff, PA-C  allopurinol (ZYLOPRIM) 300 MG tablet Take 150 mg by mouth daily.    Yes [provider]  ALPRAZolam (XANAX) 0.25 MG tablet Take 0.25 mg by mouth daily as needed for anxiety.    Yes [provider]  aspirin EC 81 MG EC tablet Take 1 tablet (81 mg total) by mouth daily. 05/14/20  Yes Black, Lezlie Octave, NP  atorvastatin (LIPITOR) 40 MG tablet Take 1 tablet (40 mg total) by mouth daily. 04/02/20 07/05/29 Yes Amin, Ankit Chirag, MD  AURYXIA 1 GM 210 MG(Fe) tablet Take 420 mg by mouth 3 (three) times daily with meals.    Yes [provider]  B  Complex-C-Folic Acid (DIALYVITE 588) 0.8 MG WAFR Take 1 tablet by mouth daily.  10/25/18  Yes [provider]  carvedilol (COREG) 6.25 MG tablet Take 6.25 mg by mouth 2 (two) times daily. 08/28/20  Yes [provider]  cinacalcet (SENSIPAR) 30 MG tablet Take 30 mg by mouth daily. 01/04/20  Yes [provider]  lactulose (CHRONULAC) 10 GM/15ML solution Take 20 g by mouth daily as needed for mild constipation.   Yes [provider]  losartan (COZAAR) 50 MG tablet Take 50 mg by mouth daily. 09/25/20  Yes [provider]  ondansetron (ZOFRAN) 8 MG tablet Take 8 mg by mouth 2 (two) times daily as needed for nausea or vomiting.    Yes [provider]  zolpidem (AMBIEN) 5 MG tablet Take 1 tablet (5 mg total)  by mouth at bedtime as needed for sleep. 07/27/20  Yes Donne Hazel, MD    Allergies    Sulfa antibiotics and Adhesive [tape]  Review of Systems   Review of Systems  Constitutional: Negative for chills and fever.  HENT: Negative for congestion, tinnitus, trouble swallowing and voice change.   Eyes: Negative for visual disturbance.  Respiratory: Negative for cough and shortness of breath.   Cardiovascular: Negative for chest pain.  Gastrointestinal: Positive for diarrhea and nausea. Negative for abdominal pain and vomiting.  Genitourinary: Negative for dysuria, enuresis, flank pain and frequency.  Musculoskeletal: Negative for back pain.  Skin: Negative for rash.  Neurological: Negative for dizziness and headaches.  Hematological: Does not bruise/bleed easily.    Physical Exam Updated Vital Signs BP 130/65   Pulse 76   Temp 98 F (36.7 C) (Oral)   Resp 18   SpO2 98%   Physical Exam Vitals and nursing note reviewed.  Constitutional:      General: She is not in acute distress.    Appearance: She is not ill-appearing.  HENT:     Head: Normocephalic and atraumatic.     Nose: No congestion.     Mouth/Throat:     Mouth: Mucous  membranes are moist.     Pharynx: Oropharynx is clear.  Eyes:     General: No scleral icterus. Cardiovascular:     Rate and Rhythm: Normal rate and regular rhythm.     Pulses: Normal pulses.     Heart sounds: No murmur heard.  No friction rub. No gallop.   Pulmonary:     Effort: No respiratory distress.     Breath sounds: No wheezing, rhonchi or rales.  Abdominal:     General: There is no distension.     Palpations: Abdomen is soft.     Tenderness: There is abdominal tenderness. There is no right CVA tenderness, left CVA tenderness or guarding.     Comments: Patient abdomen was nondistended, normoactive bowel sounds, dull to percussion, she has slight tenderness to palpation along her epigastric region and right upper quadrant pain.  No Murphy sign, no peritoneal sign or acute abdomen noted on exam.  Musculoskeletal:        General: No swelling.     Right lower leg: No edema.     Left lower leg: No edema.  Skin:    General: Skin is warm and dry.     Findings: No rash.  Neurological:     Mental Status: She is alert.     Comments: Patient has bilateral fistulas, both had palpable thrills.  No signs of infection noted on exam.  Psychiatric:        Mood and Affect: Mood normal.     ED Results / Procedures / Treatments   Labs (all labs ordered are listed, but only abnormal results are displayed) Labs Reviewed  COMPREHENSIVE METABOLIC PANEL - Abnormal; Notable for the following components:      Result Value   Potassium 6.3 (*)    Chloride 96 (*)    BUN 63 (*)    Creatinine, Ser 8.17 (*)    Albumin 3.2 (*)    Alkaline Phosphatase 130 (*)    Total Bilirubin 1.5 (*)    GFR, Estimated 5 (*)    Anion gap 18 (*)    All other components within normal limits  CBC WITH DIFFERENTIAL/PLATELET - Abnormal; Notable for the following components:   RBC 3.36 (*)    Hemoglobin 9.9 (*)  HCT 31.8 (*)    RDW 16.6 (*)    All other components within normal limits  MAGNESIUM - Abnormal;  Notable for the following components:   Magnesium 2.5 (*)    All other components within normal limits  RESPIRATORY PANEL BY RT PCR (FLU A&B, COVID)    EKG None  Radiology US Abdomen Limited  Result Date: 10/07/2020 CLINICAL DATA:  Upper abdominal pain with nausea and vomiting EXAM: ULTRASOUND ABDOMEN LIMITED RIGHT UPPER QUADRANT COMPARISON:  CT abdomen and pelvis July 21, 2020 FINDINGS: Gallbladder: Surgically absent. Common bile duct: Diameter: 9 mm which may be within normal limits for post cholecystectomy state. There is a degree of intrahepatic biliary duct dilatation. Liver: No focal lesion identified. Within normal limits in parenchymal echogenicity. Portal vein is patent on color Doppler imaging with normal direction of blood flow towards the liver. Other: None. IMPRESSION: There is biliary duct dilatation which potentially may be due to post cholecystectomy state. Gallbladder is absent. No focal liver lesions evident. Electronically Signed   By: Lowella Grip III M.D.   On: 10/07/2020 11:52    Procedures Procedures (including critical care time)  Medications Ordered in ED Medications  alum & mag hydroxide-simeth (MAALOX/MYLANTA) 200-200-20 MG/5ML suspension 30 mL (30 mLs Oral Given 10/07/20 1121)    And  lidocaine (XYLOCAINE) 2 % viscous mouth solution 15 mL (15 mLs Oral Given 10/07/20 1121)  sodium zirconium cyclosilicate (LOKELMA) packet 10 g (10 g Oral Given 10/07/20 1219)    ED Course  I have reviewed the triage vital signs and the nursing notes.  Pertinent labs & imaging results that were available during my care of the patient were reviewed by me and considered in my medical decision making (see chart for details).    MDM Rules/Calculators/A&P                          I have personally reviewed all imaging, labs and have interpreted them.   Patient presents with abdominal pain and diarrhea x2 days.  She is alert, did not appear in acute stress, vital signs  reassuring, will order lab work for further evaluation.  CBC negative for leukocytosis, shows normocytic anemia which appears to be baseline for patient.  Respiratory panel negative for Covid, influenza a/B.  CMP showing hyperkalemia of 6.3, no metabolic acidosis, elevated BUN of 63 from baseline 25, alk phos of 130, total bili 1.5, and gap of 18. Will start patient on [place].  elevated magnesium 2.5.  EKG shows sinus rhythm without signs of ischemia no ST elevation depression noted. Peak T waves were noted. Limited ultrasound study was performed did not show any acute findings.  Due to elevated potassium and EKG changes will consult with nephrology for further evaluation. Spoke with Terri Piedra, RN who placed me in touch with Stephania Fragmin [facility] of nephrology who will place her on the list to be dialysis today. She then will be sent back  to the emergency department for discharge after completing dialysis.  I low suspicion for systemic infection as patient is nontoxic-appearing, vital signs reassuring, no leukocytosis noted on CBC. Patient had negative respiratory panel. Low suspicion for ACS as patient denies chest pain, shortness of breath, no signs of hypoperfusion or fluid overload noted on exam, EKG was sinus rhythm without signs of ischemia. Low suspicion for C. difficile as patient denies recent antibiotic use, abdomen was soft nontender to palpation, vital signs reassuring. Low suspicion for hepatic or  biliary abnormality as ultrasound did not reveal any acute findings. I suspect patient's diarrhea may be secondary to hyperkalemia. Will have her go for dialysis today and if she tolerates the procedure  and is still stable at that time she can be discharge.   Final Clinical Impression(s) / ED Diagnoses Final diagnoses:  Hyperkalemia  Hypermagnesemia  Diarrhea, unspecified type    Rx / DC Orders ED Discharge Orders    None       Marcello Fennel, PA-C 10/07/20 1548    Charlesetta Shanks, MD 10/08/20 1028

## 2020-10-07 NOTE — ED Triage Notes (Signed)
Pt BIB EMS from home due to abd pain. Pt reports diarrhea and nausea. Pt reports x1 day . Pt states she is a dialysis patient and has it M&W&F. Pt hasn't missed any and is schedule to get session today.

## 2020-10-07 NOTE — ED Provider Notes (Signed)
4:59 PM Assumed care from Dr. Johnney Killian, Will Ileene Patrick and Dr Jonnie Finner, please see their notes for full history, physical and decision making until this point. In brief this is a 69 y.o. year old female who presented to the ED tonight with Abdominal Pain     Apparently patient had come in for abdominal pain, diarrhea.  She had missed dialysis secondary to her ED visit and was found to be hyperkalemic with EKG changes.  Apparently she was temporized and sent for dialysis.  She returns now at 1659 and overall appears well.  She states that her stomach feels much better.  She has not any shortness of breath.  Her lungs are clear she is not hypoxic.  States that her abdominal discomfort has resolved and she is now hungry.  We will do a p.o. tolerance and can likely be discharged if that is normal.  Seen eating on multiple occasions. Stable for discharge.   Discharge instructions, including strict return precautions for new or worsening symptoms, given. Patient and/or family verbalized understanding and agreement with the plan as described.   Labs, studies and imaging reviewed by myself and considered in medical decision making if ordered. Imaging interpreted by radiology.  Labs Reviewed  COMPREHENSIVE METABOLIC PANEL - Abnormal; Notable for the following components:      Result Value   Potassium 6.3 (*)    Chloride 96 (*)    BUN 63 (*)    Creatinine, Ser 8.17 (*)    Albumin 3.2 (*)    Alkaline Phosphatase 130 (*)    Total Bilirubin 1.5 (*)    GFR, Estimated 5 (*)    Anion gap 18 (*)    All other components within normal limits  CBC WITH DIFFERENTIAL/PLATELET - Abnormal; Notable for the following components:   RBC 3.36 (*)    Hemoglobin 9.9 (*)    HCT 31.8 (*)    RDW 16.6 (*)    All other components within normal limits  MAGNESIUM - Abnormal; Notable for the following components:   Magnesium 2.5 (*)    All other components within normal limits  RESPIRATORY PANEL BY RT PCR (FLU A&B, COVID)     US Abdomen Limited  Final Result      No follow-ups on file.    Nandika Stetzer, Corene Cornea, MD 10/09/20 0020

## 2020-10-07 NOTE — Procedures (Signed)
   I was present at this dialysis session, have reviewed the session itself and made  appropriate changes Kelly Splinter MD [place] pager [phone]   10/07/2020, 3:56 PM

## 2020-10-08 ENCOUNTER — Ambulatory Visit (INDEPENDENT_AMBULATORY_CARE_PROVIDER_SITE_OTHER): Payer: Medicare HMO | Admitting: Cardiovascular Disease

## 2020-10-08 ENCOUNTER — Encounter: Payer: Self-pay | Admitting: Cardiovascular Disease

## 2020-10-08 ENCOUNTER — Other Ambulatory Visit: Payer: Self-pay

## 2020-10-08 VITALS — HR 102 | Ht 62.0 in | Wt 106.4 lb

## 2020-10-08 DIAGNOSIS — I2510 Atherosclerotic heart disease of native coronary artery without angina pectoris: Secondary | ICD-10-CM | POA: Diagnosis not present

## 2020-10-08 NOTE — Progress Notes (Signed)
Chief Complaint  Patient presents with  . Follow-up    CAD   History of Present Illness: 69 yo female with history of CAD (seen on chest CT, no prior cath), HTN, HLD, TIA, ESRD on HD, chronic diastolic CHF and anemia here today for cardiac follow up. She was hospitalized in April 2021 for placement of a RUE fistula, though post-surgical course was complicated by shock/hypotension and hyperkalemia resulting in sustained VT for which she was cardioverted to sinus. Echo at that time showed a new wall motion abnormality which was felt to be a possible stress cardiomyopathy in the setting of hypotension and recent surgery. She was subsequently admitted later that month with chest pain and SOB. Troponins were mildly elevated at that time to the 50s therefore an ischemic evaluation was deferred. Nuclear stress test March 2021 with no ischemia. Echo April 2021 with LVEF=50-55%, G1DD, distal anteroseptal and apical wall motion abnormality, moderate to severe TR. She was admitted to [facility] June 2021 with chest pain. Troponin elevated to 2900 but her chest pain resolved with fluid removal. She refused to consider a cardiac cath or cardiac CTA.   She is here today for follow up. The patient denies any chest pain, palpitations, lower extremity edema, orthopnea, PND, dizziness, near syncope or syncope. She has daily dyspnea which seems to improve after dialysis.   Primary Care Physician: [name], MD  Past Medical History:  Diagnosis Date  . Adenomatous polyp of colon 10/2010, 2006, 2015  . Anemia in CKD (chronic kidney disease) 11/07/2012   s/p blood transfusion.   . Arthritis   . CAD (coronary artery disease)    "something like that"  . CHF (congestive heart failure) ([place])   . Constipation   . Depression with anxiety   . Diverticula, colon   . 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  Had some early rejection  then stable function for years, then had slow decline of function and went back on hemodialysis in 2012.  Gets HD TTS schedule at [facility] on [facility] still using L forearm AVF.     Marland Kitchen GERD (gastroesophageal reflux disease)   . GI bleed 2017   felt to be ischemic colitis, last colo 2015  . Headache   . Hyperlipidemia   . Hypertension   . Neurologic gait dysfunction   . Neuromuscular disorder (HCC)    neuropathy hand and legs  . Osteoporosis   . Pneumonia   . 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 Pulmonary edema 12/2019  . Stroke [facility]) 11/2015   TIA  . Weight loss, unintentional     Past Surgical History:  Procedure Laterality Date  . AV FISTULA PLACEMENT     for dialysis  . AV FISTULA PLACEMENT Left 11/22/2015   Procedure: ARTERIOVENOUS (AV) FISTULA CREATION-LEFT BRACHIOCEPHALIC;  Surgeon: Serafina Mitchell, MD;  Location: [place];  Service: Vascular;  Laterality: Left;  . AV FISTULA PLACEMENT Right 03/15/2020   Procedure: INSERTION OF ARTERIOVENOUS (AV) GORE-TEX GRAFT ARM ( BRACHIAL AXILLARY );  Surgeon: Katha Cabal, MD;  Location: [facility];  Service: Vascular;  Laterality: Right;  . BACK SURGERY    . CERVICAL FUSION    . CHOLECYSTECTOMY  12/02/2012   Procedure: LAPAROSCOPIC CHOLECYSTECTOMY WITH INTRAOPERATIVE CHOLANGIOGRAM;  Surgeon: Edward Jolly, MD;  Location: [place];  Service: General;  Laterality: N/A;  .  EYE SURGERY Bilateral    cataract surgery  . EYE SURGERY Left 2019   laser  . HEMATOMA EVACUATION Left 12/24/2016   Procedure: EVACUATION HEMATOMA LEFT UPPER ARM;  Surgeon: Waynetta Sandy, MD;  Location: [place];  Service: Vascular;  Laterality: Left;  . I & D EXTREMITY Left 12/31/2016   Procedure: IRRIGATION AND DEBRIDEMENT EXTREMITY;  Surgeon: Angelia Mould, MD;  Location: [place];  Service: Vascular;  Laterality: Left;  . INSERTION OF DIALYSIS CATHETER Right 12/24/2016   Procedure: INSERTION OF DIALYSIS CATHETER;   Surgeon: Waynetta Sandy, MD;  Location: [place];  Service: Vascular;  Laterality: Right;  . INSERTION OF DIALYSIS CATHETER Right 02/04/2017   Procedure: INSERTION OF DIALYSIS CATHETER;  Surgeon: Waynetta Sandy, MD;  Location: [place];  Service: Vascular;  Laterality: Right;  . KIDNEY TRANSPLANT  1996  . PERIPHERAL VASCULAR CATHETERIZATION Left 10/23/2016   Procedure: Fistulagram;  Surgeon: Elam Dutch, MD;  Location: [place] CV LAB;  Service: Cardiovascular;  Laterality: Left;  . PERIPHERAL VASCULAR THROMBECTOMY Right 04/16/2020   Procedure: PERIPHERAL VASCULAR THROMBECTOMY;  Surgeon: Katha Cabal, MD;  Location: [place] [facility];  Service: Cardiovascular;  Laterality: Right;  . RESECTION OF ARTERIOVENOUS FISTULA ANEURYSM Left 11/22/2015   Procedure: RESECTION OF LEFT RADIOCEPHALIC FISTULA ANEURYSM ;  Surgeon: Serafina Mitchell, MD;  Location: [place];  Service: Vascular;  Laterality: Left;  . REVISON OF ARTERIOVENOUS FISTULA Left 12/22/2016   Procedure: REVISON OF LEFT ARTERIOVENOUS FISTULA;  Surgeon: Waynetta Sandy, MD;  Location: [place];  Service: Vascular;  Laterality: Left;  . REVISON OF ARTERIOVENOUS FISTULA Left 02/04/2017   Procedure: REVISON OF LEFT UPPER ARM ARTERIOVENOUS FISTULA;  Surgeon: Waynetta Sandy, MD;  Location: [place];  Service: Vascular;  Laterality: Left;  . UPPER EXTREMITY ANGIOGRAPHY Bilateral 09/19/2019   Procedure: UPPER EXTREMITY ANGIOGRAPHY;  Surgeon: Katha Cabal, MD;  Location: [place] CV LAB;  Service: Cardiovascular;  Laterality: Bilateral;    Current Outpatient Medications  Medication Sig Dispense Refill  . acetaminophen (TYLENOL) 500 MG tablet Take 1 tablet (500 mg total) by mouth every 6 (six) hours as needed. 30 tablet 0  . allopurinol (ZYLOPRIM) 300 MG tablet Take 150 mg by mouth daily.     Marland Kitchen ALPRAZolam (XANAX) 0.25 MG tablet Take 0.25 mg by mouth daily as needed for anxiety.     Marland Kitchen aspirin EC 81 MG EC  tablet Take 1 tablet (81 mg total) by mouth daily.    Marland Kitchen atorvastatin (LIPITOR) 40 MG tablet Take 1 tablet (40 mg total) by mouth daily. 30 tablet 0  . AURYXIA 1 GM 210 MG(Fe) tablet Take 420 mg by mouth 3 (three) times daily with meals.     . B Complex-C-Folic Acid (DIALYVITE 536) 0.8 MG WAFR Take 1 tablet by mouth daily.   11  . carvedilol (COREG) 6.25 MG tablet Take 6.25 mg by mouth 2 (two) times daily.    . cinacalcet (SENSIPAR) 30 MG tablet Take 30 mg by mouth daily.    Marland Kitchen lactulose (CHRONULAC) 10 GM/15ML solution Take 20 g by mouth daily as needed for mild constipation.    Marland Kitchen losartan (COZAAR) 50 MG tablet Take 50 mg by mouth daily.    . ondansetron (ZOFRAN) 8 MG tablet Take 8 mg by mouth 2 (two) times daily as needed for nausea or vomiting.   0  . zolpidem (AMBIEN) 5 MG tablet Take 1 tablet (5 mg total) by mouth at bedtime  as needed for sleep. 20 tablet 0   No current facility-administered medications for this visit.    Allergies  Allergen Reactions  . Sulfa Antibiotics Other (See Comments)    Per patient, both parents allergic-so will not take  . Adhesive [Tape] Itching    Social History   Socioeconomic History  . Marital status: Widowed    Spouse name: Not on file  . Number of children: 2  . Years of education: Not on file  . Highest education level: Not on file  Occupational History  . Not on file  Tobacco Use  . Smoking status: Former Smoker    Types: Cigarettes    Quit date: 12/31/1991    Years since quitting: 28.7  . Smokeless tobacco: Never Used  Vaping Use  . Vaping Use: Never used  Substance and Sexual Activity  . Alcohol use: No    Alcohol/week: 0.0 standard drinks  . Drug use: No  . Sexual activity: Not Currently  Other Topics Concern  . Not on file  Social History Narrative   Living with her mother    Right handed   Caffeine: only decaf clears ("no brown sodas" or coffee d/t kidney disease)   Low potassium foods d/t kidney disease   Social  Determinants of Health   Financial Resource Strain:   . Difficulty of Paying Living Expenses: Not on file  Food Insecurity:   . Worried About Charity fundraiser in the Last Year: Not on file  . Ran Out of Food in the Last Year: Not on file  Transportation Needs:   . Lack of Transportation (Medical): Not on file  . Lack of Transportation (Non-Medical): Not on file  Physical Activity:   . Days of Exercise per Week: Not on file  . Minutes of Exercise per Session: Not on file  Stress:   . Feeling of Stress : Not on file  Social Connections:   . Frequency of Communication with Friends and Family: Not on file  . Frequency of Social Gatherings with Friends and Family: Not on file  . Attends Religious Services: Not on file  . Active Member of Clubs or Organizations: Not on file  . Attends Archivist Meetings: Not on file  . Marital Status: Not on file  Intimate Partner Violence:   . Fear of Current or Ex-Partner: Not on file  . Emotionally Abused: Not on file  . Physically Abused: Not on file  . Sexually Abused: Not on file    Family History  Problem Relation Age of Onset  . Colon cancer Brother   . Cancer Brother   . Coronary artery disease Mother 19  . Hyperlipidemia Mother   . Hypertension Mother   . Stroke Maternal Aunt   . Esophageal cancer Neg Hx   . Stomach cancer Neg Hx   . Rectal cancer Neg Hx     Review of Systems:  As stated in the HPI and otherwise negative.   Pulse (!) 102   Ht 5\' 2"  (1.575 m)   Wt 106 lb 6.4 oz (48.3 kg)   SpO2 99%   BMI 19.46 kg/m   NO blood pressure reading due to inability to use arms for her BP (HD access). Unable to obtain pressure reading in her legs.   Physical Examination:  General: Well developed, well nourished, NAD  HEENT: OP clear, mucus membranes moist  SKIN: warm, dry. No rashes. Neuro: No focal deficits  Musculoskeletal: Muscle strength 5/5 all ext  Psychiatric: Mood and affect normal  Neck: No JVD, no  carotid bruits, no thyromegaly, no lymphadenopathy.  Lungs:Clear bilaterally, no wheezes, rhonci, crackles Cardiovascular: Tachy, Regular rhythm. Systolic murmur.  Abdomen:Soft. Bowel sounds present. Non-tender.  Extremities: No lower extremity edema. Pulses are 2 + in the bilateral DP/PT.  EKG:  EKG is not ordered today. The ekg ordered today demonstrates   Recent Labs: 03/15/2020: TSH 2.793 07/21/2020: B Natriuretic Peptide >4,500.0 10/07/2020: ALT 13; BUN 63; Creatinine, Ser 8.17; Hemoglobin 9.9; Magnesium 2.5; Platelets 179; Potassium 6.3; Sodium 139   Lipid Panel    Component Value Date/Time   CHOL 192 04/01/2020 0620   TRIG 131 04/01/2020 0620   HDL 45 04/01/2020 0620   CHOLHDL 4.3 04/01/2020 0620   VLDL 26 04/01/2020 0620   LDLCALC 121 (H) 04/01/2020 0620     Wt Readings from Last 3 Encounters:  10/08/20 106 lb 6.4 oz (48.3 kg)  07/27/20 108 lb 0.4 oz (49 kg)  07/05/20 108 lb 0.4 oz (49 kg)     Assessment and Plan:   1. CAD without angina: Evidence of coronary artery disease on chest CT in December 2020. No ischemia on stress test in March 2021. She has no chest pain. It is not clear that her dyspnea is cardiac related. Her probability of CAD is high. We have discussed a cardiac cath if her dyspnea persists over the next few months. I will continue her ASA, statin and beta blocker for now.   2. HTN: BP has been well controlled per pt at HD sessions.   Current medicines are reviewed at length with the patient today.  The patient does not have concerns regarding medicines.  The following changes have been made:  no change  Labs/ tests ordered today include:  No orders of the defined types were placed in this encounter.    Disposition:   FU with me in 2-3 months   Signed, Lauree Chandler, MD 10/08/2020 3:40 PM    [facility] [place], [place], [place]  [address] Phone: [phone]; Fax: [phone]

## 2020-10-08 NOTE — Patient Instructions (Signed)
Medication Instructions:  No changes *If you need a refill on your cardiac medications before your next appointment, please call your pharmacy*   Lab Work: none If you have labs (blood work) drawn today and your tests are completely normal, you will receive your results only by: Marland Kitchen MyChart Message (if you have MyChart) OR . A paper copy in the mail If you have any lab test that is abnormal or we need to change your treatment, we will call you to review the results.   Testing/Procedures: none   Follow-Up: At [facility], you and your health needs are our priority.  As part of our continuing mission to provide you with exceptional heart care, we have created designated Provider Care Teams.  These Care Teams include your primary Cardiologist (physician) and Advanced Practice Providers (APPs -  Physician Assistants and Nurse Practitioners) who all work together to provide you with the care you need, when you need it.  We recommend signing up for the patient portal called "MyChart".  Sign up information is provided on this After Visit Summary.  MyChart is used to connect with patients for Virtual Visits (Telemedicine).  Patients are able to view lab/test results, encounter notes, upcoming appointments, etc.  Non-urgent messages can be sent to your provider as well.   To learn more about what you can do with MyChart, go to [email or web address].    Your next appointment:   2-3 month(s)  The format for your next appointment:   In Person  Provider:   You may see Lauree Chandler, MD or one of the following Advanced Practice Providers on your designated Care Team:    Melina Copa, PA-C  Ermalinda Barrios, PA-C    Other Instructions

## 2020-10-09 DIAGNOSIS — Z992 Dependence on renal dialysis: Secondary | ICD-10-CM | POA: Diagnosis not present

## 2020-10-09 DIAGNOSIS — N186 End stage renal disease: Secondary | ICD-10-CM | POA: Diagnosis not present

## 2020-10-09 DIAGNOSIS — N2581 Secondary hyperparathyroidism of renal origin: Secondary | ICD-10-CM | POA: Diagnosis not present

## 2020-10-11 DIAGNOSIS — Z992 Dependence on renal dialysis: Secondary | ICD-10-CM | POA: Diagnosis not present

## 2020-10-11 DIAGNOSIS — N2581 Secondary hyperparathyroidism of renal origin: Secondary | ICD-10-CM | POA: Diagnosis not present

## 2020-10-11 DIAGNOSIS — N186 End stage renal disease: Secondary | ICD-10-CM | POA: Diagnosis not present

## 2020-10-13 ENCOUNTER — Emergency Department (HOSPITAL_COMMUNITY)
Admission: EM | Admit: 2020-10-13 | Discharge: 2020-10-14 | Disposition: A | Discharge status: Home / Self Care | Payer: Medicare HMO | Attending: Emergency Medicine | Admitting: Emergency Medicine

## 2020-10-13 ENCOUNTER — Other Ambulatory Visit: Payer: Self-pay

## 2020-10-13 DIAGNOSIS — J449 Chronic obstructive pulmonary disease, unspecified: Secondary | ICD-10-CM | POA: Diagnosis not present

## 2020-10-13 DIAGNOSIS — Z79899 Other long term (current) drug therapy: Secondary | ICD-10-CM | POA: Diagnosis not present

## 2020-10-13 DIAGNOSIS — N186 End stage renal disease: Secondary | ICD-10-CM | POA: Diagnosis not present

## 2020-10-13 DIAGNOSIS — I517 Cardiomegaly: Secondary | ICD-10-CM | POA: Diagnosis not present

## 2020-10-13 DIAGNOSIS — R0602 Shortness of breath: Secondary | ICD-10-CM | POA: Diagnosis not present

## 2020-10-13 DIAGNOSIS — Z20822 Contact with and (suspected) exposure to covid-19: Secondary | ICD-10-CM | POA: Diagnosis not present

## 2020-10-13 DIAGNOSIS — Z87891 Personal history of nicotine dependence: Secondary | ICD-10-CM | POA: Diagnosis not present

## 2020-10-13 DIAGNOSIS — I2510 Atherosclerotic heart disease of native coronary artery without angina pectoris: Secondary | ICD-10-CM | POA: Insufficient documentation

## 2020-10-13 DIAGNOSIS — T8612 Kidney transplant failure: Secondary | ICD-10-CM | POA: Diagnosis not present

## 2020-10-13 DIAGNOSIS — Z8673 Personal history of transient ischemic attack (TIA), and cerebral infarction without residual deficits: Secondary | ICD-10-CM | POA: Insufficient documentation

## 2020-10-13 DIAGNOSIS — Z992 Dependence on renal dialysis: Secondary | ICD-10-CM | POA: Diagnosis not present

## 2020-10-13 DIAGNOSIS — I5033 Acute on chronic diastolic (congestive) heart failure: Secondary | ICD-10-CM | POA: Insufficient documentation

## 2020-10-13 DIAGNOSIS — R0689 Other abnormalities of breathing: Secondary | ICD-10-CM | POA: Diagnosis not present

## 2020-10-13 DIAGNOSIS — I10 Essential (primary) hypertension: Secondary | ICD-10-CM | POA: Diagnosis not present

## 2020-10-13 DIAGNOSIS — R0902 Hypoxemia: Secondary | ICD-10-CM

## 2020-10-13 DIAGNOSIS — Z7982 Long term (current) use of aspirin: Secondary | ICD-10-CM | POA: Diagnosis not present

## 2020-10-13 DIAGNOSIS — I132 Hypertensive heart and chronic kidney disease with heart failure and with stage 5 chronic kidney disease, or end stage renal disease: Secondary | ICD-10-CM | POA: Diagnosis not present

## 2020-10-13 NOTE — ED Triage Notes (Signed)
Pt from home; reports SOB for one hour with hx of CHF and renal failure.  Dialysis is MWF and received full session on Friday.  Pt on 3L O2 on arrival; placed on 3L here.  Pt A&O x 4.

## 2020-10-14 ENCOUNTER — Emergency Department (HOSPITAL_COMMUNITY): Payer: Medicare HMO

## 2020-10-14 DIAGNOSIS — I517 Cardiomegaly: Secondary | ICD-10-CM | POA: Diagnosis not present

## 2020-10-14 DIAGNOSIS — J449 Chronic obstructive pulmonary disease, unspecified: Secondary | ICD-10-CM | POA: Diagnosis not present

## 2020-10-14 DIAGNOSIS — R0602 Shortness of breath: Secondary | ICD-10-CM | POA: Diagnosis not present

## 2020-10-14 LAB — CBC WITH DIFFERENTIAL/PLATELET
Abs Immature Granulocytes: 0.04 10*3/uL (ref 0.00–0.07)
Basophils Absolute: 0 10*3/uL (ref 0.0–0.1)
Basophils Relative: 0 %
Eosinophils Absolute: 0.2 10*3/uL (ref 0.0–0.5)
Eosinophils Relative: 2 %
HCT: 31.4 % — ABNORMAL LOW (ref 36.0–46.0)
Hemoglobin: 9.7 g/dL — ABNORMAL LOW (ref 12.0–15.0)
Immature Granulocytes: 0 %
Lymphocytes Relative: 14 %
Lymphs Abs: 1.3 10*3/uL (ref 0.7–4.0)
MCH: 29.8 pg (ref 26.0–34.0)
MCHC: 30.9 g/dL (ref 30.0–36.0)
MCV: 96.3 fL (ref 80.0–100.0)
Monocytes Absolute: 0.8 10*3/uL (ref 0.1–1.0)
Monocytes Relative: 8 %
Neutro Abs: 6.9 10*3/uL (ref 1.7–7.7)
Neutrophils Relative %: 76 %
Platelets: 261 10*3/uL (ref 150–400)
RBC: 3.26 MIL/uL — ABNORMAL LOW (ref 3.87–5.11)
RDW: 17.2 % — ABNORMAL HIGH (ref 11.5–15.5)
WBC: 9.3 10*3/uL (ref 4.0–10.5)
nRBC: 0 % (ref 0.0–0.2)

## 2020-10-14 LAB — RESPIRATORY PANEL BY RT PCR (FLU A&B, COVID)
Influenza A by PCR: NEGATIVE
Influenza B by PCR: NEGATIVE
SARS Coronavirus 2 by RT PCR: NEGATIVE

## 2020-10-14 LAB — BASIC METABOLIC PANEL
Anion gap: 16 — ABNORMAL HIGH (ref 5–15)
BUN: 50 mg/dL — ABNORMAL HIGH (ref 8–23)
CO2: 25 mmol/L (ref 22–32)
Calcium: 9.3 mg/dL (ref 8.9–10.3)
Chloride: 99 mmol/L (ref 98–111)
Creatinine, Ser: 7.56 mg/dL — ABNORMAL HIGH (ref 0.44–1.00)
GFR, Estimated: 5 mL/min — ABNORMAL LOW (ref >60)
Glucose, Bld: 101 mg/dL — ABNORMAL HIGH (ref 70–99)
Potassium: 4.6 mmol/L (ref 3.5–5.1)
Sodium: 140 mmol/L (ref 135–145)

## 2020-10-14 MED ORDER — ACETAMINOPHEN 325 MG PO TABS
650.0000 mg | ORAL_TABLET | Freq: Once | ORAL | Status: AC
  Administered 2020-10-14: 650 mg via ORAL
  Filled 2020-10-14: qty 2

## 2020-10-14 MED ORDER — CHLORHEXIDINE GLUCONATE CLOTH 2 % EX PADS: 6.0000 | MEDICATED_PAD | Freq: Every day | CUTANEOUS | Status: DC

## 2020-10-14 MED ORDER — ONDANSETRON 4 MG PO TBDP
4.0000 mg | ORAL_TABLET | Freq: Once | ORAL | Status: AC
  Administered 2020-10-14: 4 mg via ORAL
  Filled 2020-10-14: qty 1

## 2020-10-14 MED ORDER — ONDANSETRON HCL 4 MG/2ML IJ SOLN: 4.0000 mg | Freq: Once | INTRAMUSCULAR | Status: DC

## 2020-10-14 NOTE — Procedures (Signed)
   I was present at this dialysis session, have reviewed the session itself and made  appropriate changes Kelly Splinter MD [place] pager [phone]   10/14/2020, 2:44 PM

## 2020-10-14 NOTE — ED Provider Notes (Signed)
Patient returned from dialysis.  She reports she is feeling much better and she is ready to go home.   Blanchie Dessert, MD 10/14/20 1950

## 2020-10-14 NOTE — ED Notes (Signed)
[name] pt's sister called to say they will send someone to pick pt up and to take her off of [facility]'S list. [facility] called at 2300.

## 2020-10-14 NOTE — ED Notes (Signed)
Pt was d/c by MD after dialysis was waiting for [facility] for transport pt home, However family came to pick pt up, This nurse was unable to provide d/c vitals, Pt was provided w/ d/c instructions and follow up care out of ED in wheel chair

## 2020-10-14 NOTE — ED Provider Notes (Signed)
[facility] EMERGENCY DEPARTMENT Provider Note   CSN: [id number] Arrival date & time: 10/13/20  2349     History Chief Complaint  Patient presents with  . Shortness of Breath    [name] is a 69 y.o. female.  The history is provided by the patient and medical records.  Shortness of Breath   69 year old female with history of coronary artery disease, CHF, depression, end-stage renal disease on hemodialysis (MWF), GERD, hyperlipidemia, hypertension, presenting to the ED with shortness of breath.  States began a few hours ago.  She describes it as "not getting all of my air".  She reports a mild cough has been nonproductive.  She has not had any fever or chills.  No sick contacts.  She does live with her elderly mother but she has not been sick either.  She arrives on 3 L O2, generally is not oxygen dependent but does report by the time of her treatment on Monday she usually asked for oxygen for comfort.  She has not missed any of her recent dialysis sessions and had full treatment on Friday.  Past Medical History:  Diagnosis Date  . Adenomatous polyp of colon 10/2010, 2006, 2015  . Anemia in CKD (chronic kidney disease) 11/07/2012   s/p blood transfusion.   . Arthritis   . CAD (coronary artery disease)    "something like that"  . CHF (congestive heart failure) ([place])   . Constipation   . Depression with anxiety   . Diverticula, colon   . 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  Had some early rejection then stable function for years, then had slow decline of function and went back on hemodialysis in 2012.  Gets HD TTS schedule at [facility] on [facility] still using L forearm AVF.     Marland Kitchen GERD (gastroesophageal reflux disease)   . GI bleed 2017   felt to be ischemic colitis, last colo 2015  . Headache   . Hyperlipidemia   . Hypertension   . Neurologic gait  dysfunction   . Neuromuscular disorder (HCC)    neuropathy hand and legs  . Osteoporosis   . Pneumonia   . 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 Pulmonary edema 12/2019  . Stroke [facility]) 11/2015   TIA  . Weight loss, unintentional     Patient Active Problem List   Diagnosis Date Noted  . NSTEMI (non-ST elevated myocardial infarction) ([place]) 05/14/2020  . Volume overload 05/13/2020  . Hypotension after procedure 03/16/2020  . Chest pain 03/16/2020  . Change in mental state 03/15/2020  . Ventricular tachycardia ([place]) 03/15/2020  . Seizure ([place]) 03/15/2020  . Unresponsive episode 03/15/2020  . Pulmonary edema 01/01/2020  . Complication of vascular access for dialysis 12/21/2019  . Breakdown (mechanical) of surgically created arteriovenous fistula, initial encounter ([place]) 12/18/2019  . Weakness 10/11/2019  . 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 Dependence on renal dialysis ([place]) 07/24/2019  . Iron deficiency anemia, unspecified 06/09/2019  . Age-related osteoporosis without current pathological fracture 04/17/2019  . 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 Coagulation defect, unspecified ([place]) 04/17/2019  . Diarrhea, unspecified 04/17/2019  . Encounter for screening for depression 04/17/2019  . Headache, unspecified 04/17/2019  . 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 Pain, unspecified 04/17/2019  . Polyp of colon 04/17/2019  . Primary generalized (osteo)arthritis  04/17/2019  . Pruritus, unspecified 04/17/2019  . Pure hypercholesterolemia, unspecified 04/17/2019  . Secondary hyperparathyroidism of renal origin ([place]) 04/17/2019  . Gastro-esophageal reflux disease without esophagitis 04/17/2019  . Essential (primary) hypertension 04/17/2019  . Hyperlipidemia, unspecified 04/17/2019  . Transient cerebral ischemic attack, unspecified 04/17/2019  . Hypoxemia 12/13/2018  . Atrial fibrillation ([place]) 03/23/2017  .  Prolonged Q-T interval on ECG 02/24/2017  . Hypokalemia 02/24/2017  . Acute ischemic stroke ([place]) 02/23/2017  . Malnutrition of moderate degree 12/24/2016  . Problem with dialysis access ([place]) 12/21/2016  . Acute ischemic colitis ([place]) 05/16/2016  . Colitis 05/15/2016  . Rectal bleeding 05/15/2016  . Neurologic abnormality 11/19/2015  . Hyperkalemia 11/19/2015  . Anxiety 11/19/2015  . Insomnia 11/19/2015  . Gait instability   . Stroke-like symptom 11/16/2015  . Vestibular neuritis 11/16/2015  . Stroke (cerebrum) ([place]) 11/16/2015  . Dizziness 05/09/2015  . Ataxia 05/09/2015  . H/O: CVA (cerebrovascular accident) 05/09/2015  . Left facial numbness 05/09/2015  . Left leg numbness 05/09/2015  . Hyperlipidemia   . Acute on chronic diastolic heart failure ([place])   . Shortness of breath 04/01/2013  . HTN (hypertension) 04/01/2013  . Cholecystitis, acute 11/07/2012  . End-stage renal disease on hemodialysis ([place]) 11/07/2012  . Anemia in chronic kidney disease 11/07/2012    Past Surgical History:  Procedure Laterality Date  . AV FISTULA PLACEMENT     for dialysis  . AV FISTULA PLACEMENT Left 11/22/2015   Procedure: ARTERIOVENOUS (AV) FISTULA CREATION-LEFT BRACHIOCEPHALIC;  Surgeon: Serafina Mitchell, MD;  Location: [facility];  Service: Vascular;  Laterality: Left;  . AV FISTULA PLACEMENT Right 03/15/2020   Procedure: INSERTION OF ARTERIOVENOUS (AV) GORE-TEX GRAFT ARM ( BRACHIAL AXILLARY );  Surgeon: Katha Cabal, MD;  Location: [facility];  Service: Vascular;  Laterality: Right;  . BACK SURGERY    . CERVICAL FUSION    . CHOLECYSTECTOMY  12/02/2012   Procedure: LAPAROSCOPIC CHOLECYSTECTOMY WITH INTRAOPERATIVE CHOLANGIOGRAM;  Surgeon: Edward Jolly, MD;  Location: [place];  Service: General;  Laterality: N/A;  . EYE SURGERY Bilateral    cataract surgery  . EYE SURGERY Left 2019   laser  . HEMATOMA EVACUATION Left 12/24/2016   Procedure: EVACUATION HEMATOMA LEFT UPPER ARM;  Surgeon:  Waynetta Sandy, MD;  Location: [facility];  Service: Vascular;  Laterality: Left;  . I & D EXTREMITY Left 12/31/2016   Procedure: IRRIGATION AND DEBRIDEMENT EXTREMITY;  Surgeon: Angelia Mould, MD;  Location: [place];  Service: Vascular;  Laterality: Left;  . INSERTION OF DIALYSIS CATHETER Right 12/24/2016   Procedure: INSERTION OF DIALYSIS CATHETER;  Surgeon: Waynetta Sandy, MD;  Location: [place];  Service: Vascular;  Laterality: Right;  . INSERTION OF DIALYSIS CATHETER Right 02/04/2017   Procedure: INSERTION OF DIALYSIS CATHETER;  Surgeon: Waynetta Sandy, MD;  Location: [facility];  Service: Vascular;  Laterality: Right;  . KIDNEY TRANSPLANT  1996  . PERIPHERAL VASCULAR CATHETERIZATION Left 10/23/2016   Procedure: Fistulagram;  Surgeon: Elam Dutch, MD;  Location: [facility] [facility];  Service: Cardiovascular;  Laterality: Left;  . PERIPHERAL VASCULAR THROMBECTOMY Right 04/16/2020   Procedure: PERIPHERAL VASCULAR THROMBECTOMY;  Surgeon: Katha Cabal, MD;  Location: [facility];  Service: Cardiovascular;  Laterality: Right;  . RESECTION OF ARTERIOVENOUS FISTULA ANEURYSM Left 11/22/2015   Procedure: RESECTION OF LEFT RADIOCEPHALIC FISTULA ANEURYSM ;  Surgeon: Serafina Mitchell, MD;  Location: [facility];  Service: Vascular;  Laterality: Left;  . REVISON OF ARTERIOVENOUS  FISTULA Left 12/22/2016   Procedure: REVISON OF LEFT ARTERIOVENOUS FISTULA;  Surgeon: Waynetta Sandy, MD;  Location: [place];  Service: Vascular;  Laterality: Left;  . REVISON OF ARTERIOVENOUS FISTULA Left 02/04/2017   Procedure: REVISON OF LEFT UPPER ARM ARTERIOVENOUS FISTULA;  Surgeon: Waynetta Sandy, MD;  Location: [facility];  Service: Vascular;  Laterality: Left;  . UPPER EXTREMITY ANGIOGRAPHY Bilateral 09/19/2019   Procedure: UPPER EXTREMITY ANGIOGRAPHY;  Surgeon: Katha Cabal, MD;  Location: [place] CV [facility];  Service: Cardiovascular;  Laterality: Bilateral;     OB History    No obstetric history on file.     Family History  Problem Relation Age of Onset  . Colon cancer Brother   . Cancer Brother   . Coronary artery disease Mother 60  . Hyperlipidemia Mother   . Hypertension Mother   . Stroke Maternal Aunt   . Esophageal cancer Neg Hx   . Stomach cancer Neg Hx   . Rectal cancer Neg Hx     Social History   Tobacco Use  . Smoking status: Former Smoker    Types: Cigarettes    Quit date: 12/31/1991    Years since quitting: 28.8  . Smokeless tobacco: Never Used  Vaping Use  . Vaping Use: Never used  Substance Use Topics  . Alcohol use: No    Alcohol/week: 0.0 standard drinks  . Drug use: No    Home Medications Prior to Admission medications   Medication Sig Start Date End Date Taking? Authorizing Provider  acetaminophen (TYLENOL) 500 MG tablet Take 1 tablet (500 mg total) by mouth every 6 (six) hours as needed. 09/05/18   Law, Bea Graff, PA-C  allopurinol (ZYLOPRIM) 300 MG tablet Take 150 mg by mouth daily.     [provider]  ALPRAZolam Duanne Moron) 0.25 MG tablet Take 0.25 mg by mouth daily as needed for anxiety.     [provider]  aspirin EC 81 MG EC tablet Take 1 tablet (81 mg total) by mouth daily. 05/14/20   Black, Lezlie Octave, NP  atorvastatin (LIPITOR) 40 MG tablet Take 1 tablet (40 mg total) by mouth daily. 04/02/20 07/05/29  Amin, Ankit Chirag, MD  AURYXIA 1 GM 210 MG(Fe) tablet Take 420 mg by mouth 3 (three) times daily with meals.     [provider]  B Complex-C-Folic Acid (DIALYVITE 962) 0.8 MG WAFR Take 1 tablet by mouth daily.  10/25/18   [provider]  carvedilol (COREG) 6.25 MG tablet Take 6.25 mg by mouth 2 (two) times daily. 08/28/20   [provider]  cinacalcet (SENSIPAR) 30 MG tablet Take 30 mg by mouth daily. 01/04/20   [provider]  lactulose (CHRONULAC) 10 GM/15ML solution Take 20 g by mouth daily as needed for mild constipation.    [provider]  losartan  (COZAAR) 50 MG tablet Take 50 mg by mouth daily. 09/25/20   [provider]  ondansetron (ZOFRAN) 8 MG tablet Take 8 mg by mouth 2 (two) times daily as needed for nausea or vomiting.     [provider]  zolpidem (AMBIEN) 5 MG tablet Take 1 tablet (5 mg total) by mouth at bedtime as needed for sleep. 07/27/20   Donne Hazel, MD    Allergies    Sulfa antibiotics and Adhesive [tape]  Review of Systems   Review of Systems  Respiratory: Positive for shortness of breath.   All other systems reviewed and are negative.   Physical Exam  Updated Vital Signs BP (!) 141/75   Pulse 86   Temp 98.1 F (36.7 C) (Oral)   Resp 20   Ht 5\' 2"  (1.575 m)   Wt 48.3 kg   SpO2 96%   BMI 19.48 kg/m   Physical Exam Vitals and nursing note reviewed.  Constitutional:      Appearance: She is well-developed.     Comments: Elderly, pleasant  HENT:     Head: Normocephalic and atraumatic.  Eyes:     Conjunctiva/sclera: Conjunctivae normal.     Pupils: Pupils are equal, round, and reactive to light.  Cardiovascular:     Rate and Rhythm: Normal rate and regular rhythm.     Heart sounds: Normal heart sounds.  Pulmonary:     Effort: Pulmonary effort is normal.     Breath sounds: Normal breath sounds. No wheezing or rhonchi.     Comments: 3L O2 in use, NAD, able to answer questions in sentences, grossly clear, sats 99% during exam Abdominal:     General: Bowel sounds are normal.     Palpations: Abdomen is soft.  Musculoskeletal:        General: Normal range of motion.     Cervical back: Normal range of motion.  Skin:    General: Skin is warm and dry.  Neurological:     Mental Status: She is alert and oriented to person, place, and time.     ED Results / Procedures / Treatments   Labs (all labs ordered are listed, but only abnormal results are displayed) Labs Reviewed  CBC WITH DIFFERENTIAL/PLATELET - Abnormal; Notable for the following components:      Result Value   RBC  3.26 (*)    Hemoglobin 9.7 (*)    HCT 31.4 (*)    RDW 17.2 (*)    All other components within normal limits  BASIC METABOLIC PANEL - Abnormal; Notable for the following components:   Glucose, Bld 101 (*)    BUN 50 (*)    Creatinine, Ser 7.56 (*)    GFR, Estimated 5 (*)    Anion gap 16 (*)    All other components within normal limits  RESPIRATORY PANEL BY RT PCR (FLU A&B, COVID)    EKG None  Radiology DG Chest Port 1 View  Result Date: 10/14/2020 CLINICAL DATA:  Shortness of breath. EXAM: PORTABLE CHEST 1 VIEW COMPARISON:  July 21, 2020 FINDINGS: The lungs are hyperinflated. Mild, diffuse, chronic appearing increased interstitial lung markings are seen. There is no evidence of a pleural effusion or pneumothorax. The cardiac silhouette is moderately enlarged and unchanged in size. There is marked severity calcification of the thoracic aorta. A chronic seventh left rib fracture is seen. IMPRESSION: 1. COPD. 2. Stable cardiomegaly without acute or active cardiopulmonary disease. Electronically Signed   By: Virgina Norfolk M.D.   On: 10/14/2020 00:37    Procedures Procedures (including critical care time)  Medications Ordered in ED Medications - No data to display  ED Course  I have reviewed the triage vital signs and the nursing notes.  Pertinent labs & imaging results that were available during my care of the patient were reviewed by me and considered in my medical decision making (see chart for details).    MDM Rules/Calculators/A&P  69 year old female presenting to the ED with shortness of breath.  Has history of ESRD on HD schedule MWF.  She did have full treatment on Friday, no missed sessions.  She is afebrile, non-toxic.  She is  in NAD. Does not appear significantly fluid overloaded.  She is on 3L currently for comfort, maintaining into the 90's.  Does report mild cough recently but no fever/chills.  She is vaccinated for covid.  Labs here grossly reassuring, normal K+.   CXR clear.  RT panel is negative.  Patient is maintaining normal sats on RA.  Will attempt to ambulate.    Attempted to ambulate here and O2 sats dropped to 80% pretty much as soon as she stood at bedside.  States she got very SOB and weak when this happened.  While at rest O2 sats remain stable in 90's on RA.  Will discuss with nephrology to try and arrange dialysis in the AM here in hospital and d/c home after.  Spoke with on call nephrology, Dr. Justin Mend-- will hold in the ED, dialysis IP this AM and can likely discharge afterwards if doing better.  Final Clinical Impression(s) / ED Diagnoses Final diagnoses:  SOB (shortness of breath)  Hypoxia    Rx / DC Orders ED Discharge Orders    None       Larene Pickett, PA-C 10/14/20 0542    Merrily Pew, MD 10/14/20 [phone]

## 2020-10-14 NOTE — Progress Notes (Signed)
Asked to see for dialysis. Pt c/o SOB over the weekend, last HD Friday.  In ED sat's drop into 80's w/ standing. CXR shows slight ^in chronic congestion/ IS markings. Pt seen in ED, not in distress.  No sig edema on exam.  +jvd, lungs clear.  Plan HD upstairs and then will return to ED for re-eval prior to dc.    [place] MWF 4h  350/1.5  46kg  2/2 bath   RUA AVG  Hep none  - mircera 50 q 2, last 10/27, last Hb 11.4  - hect 4 ug , last pth 1028,  8.4/ 6.5 (Ca/P)  Kelly Splinter, MD 10/14/2020, 11:29 AM

## 2020-10-14 NOTE — ED Notes (Signed)
This NT checked pulse oximetry while ambulating. Oxygen starting dropping as soon as pt started ambulating, dropping lowest to 80. Once back in bed, pt o2 levels went back to 100. Pt stated she did not have trouble breathing, only general weakness when ambulating.

## 2020-10-16 DIAGNOSIS — N2581 Secondary hyperparathyroidism of renal origin: Secondary | ICD-10-CM | POA: Diagnosis not present

## 2020-10-16 DIAGNOSIS — N186 End stage renal disease: Secondary | ICD-10-CM | POA: Diagnosis not present

## 2020-10-16 DIAGNOSIS — Z992 Dependence on renal dialysis: Secondary | ICD-10-CM | POA: Diagnosis not present

## 2020-10-18 DIAGNOSIS — Z992 Dependence on renal dialysis: Secondary | ICD-10-CM | POA: Diagnosis not present

## 2020-10-18 DIAGNOSIS — N186 End stage renal disease: Secondary | ICD-10-CM | POA: Diagnosis not present

## 2020-10-18 DIAGNOSIS — N2581 Secondary hyperparathyroidism of renal origin: Secondary | ICD-10-CM | POA: Diagnosis not present

## 2020-10-21 DIAGNOSIS — N186 End stage renal disease: Secondary | ICD-10-CM | POA: Diagnosis not present

## 2020-10-21 DIAGNOSIS — Z992 Dependence on renal dialysis: Secondary | ICD-10-CM | POA: Diagnosis not present

## 2020-10-21 DIAGNOSIS — N2581 Secondary hyperparathyroidism of renal origin: Secondary | ICD-10-CM | POA: Diagnosis not present

## 2020-10-22 IMAGING — DX DG CHEST 1V PORT
1 series · 1 of 1 positions shown · non-contrast
Comparison: Portable exam 5571 hours compared to 01/01/2020

CLINICAL DATA: Shortness of breath

EXAM:
PORTABLE CHEST 1 VIEW

[chest ap]
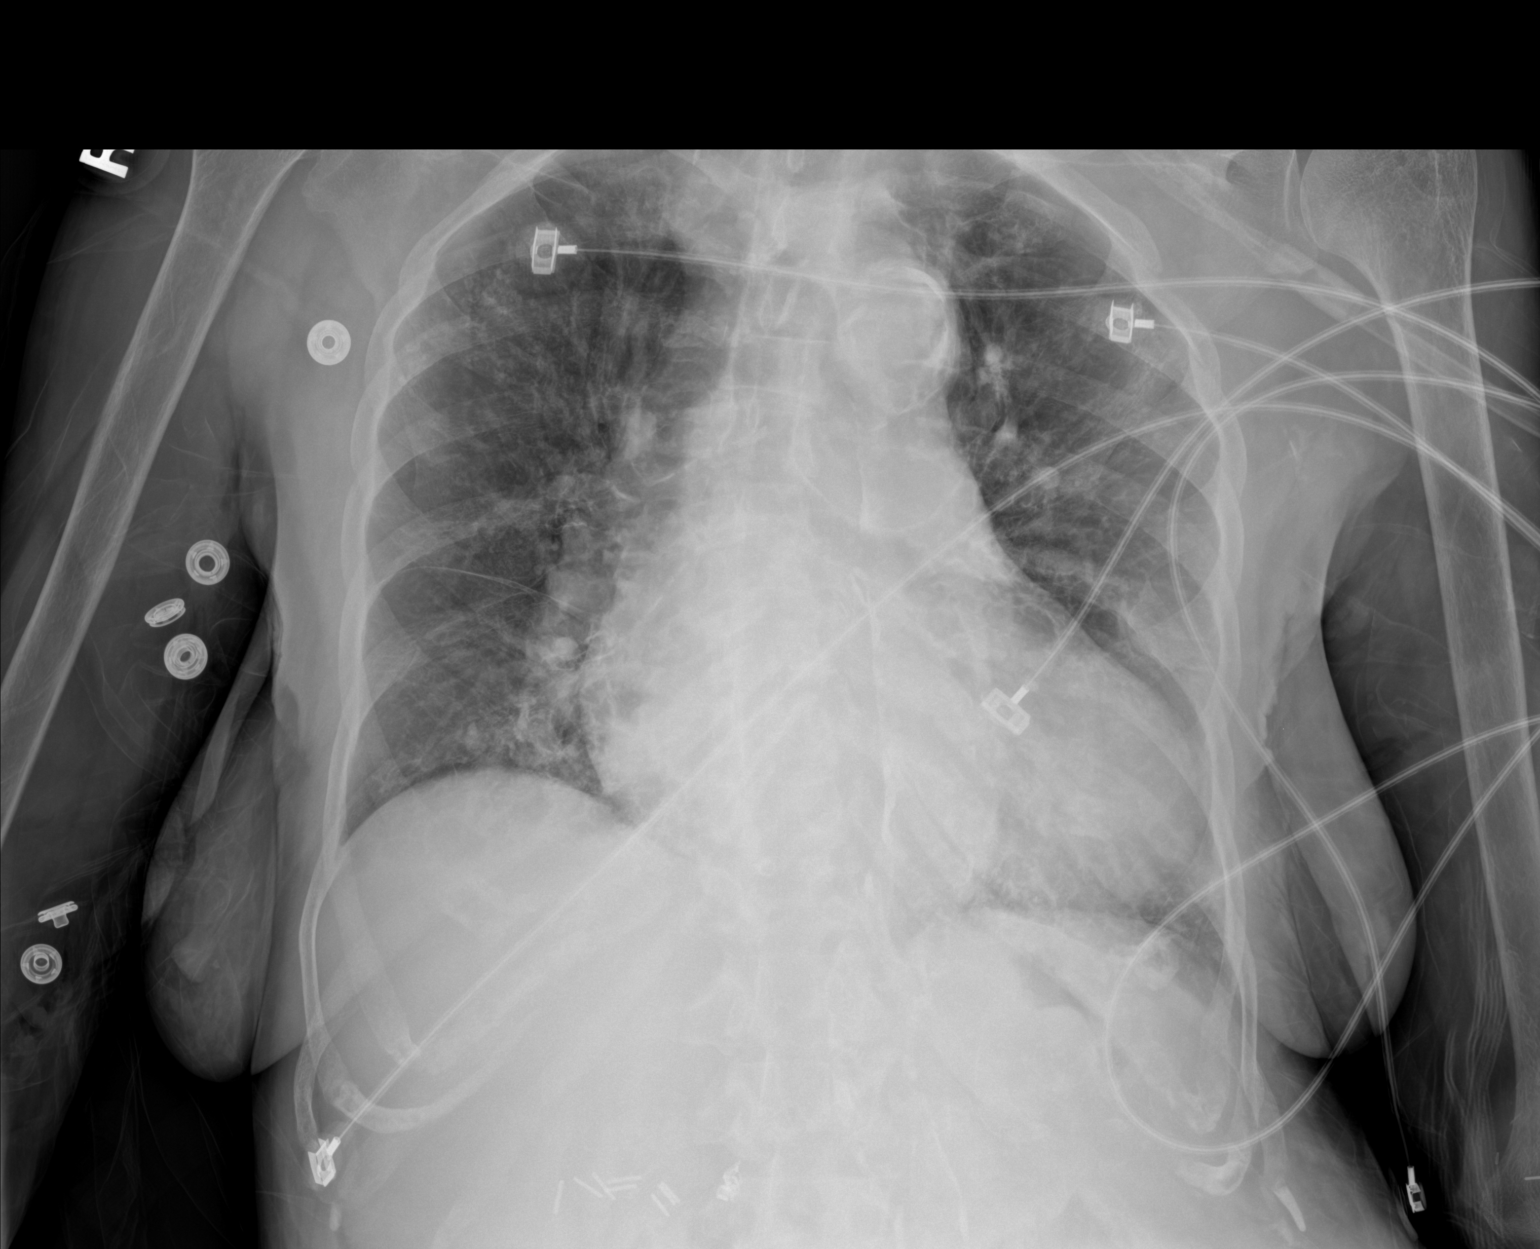

[1 of 1 positions shown; findings below may reference images not displayed]

FINDINGS: Enlargement of cardiac silhouette with pulmonary vascular
congestion.

Atherosclerotic calcification aorta.

Peribronchial thickening with accentuated interstitial markings
likely representing pulmonary edema and CHF, slightly improved from
previous study.

No pleural effusion or pneumothorax.

Bones demineralized with degenerative changes of the thoracic spine
and probable chronic RIGHT rotator cuff tear.
IMPRESSION: Enlargement of cardiac silhouette with vascular congestion and
probable pulmonary edema, slightly improved.

## 2020-10-23 DIAGNOSIS — N186 End stage renal disease: Secondary | ICD-10-CM | POA: Diagnosis not present

## 2020-10-23 DIAGNOSIS — N2581 Secondary hyperparathyroidism of renal origin: Secondary | ICD-10-CM | POA: Diagnosis not present

## 2020-10-23 DIAGNOSIS — Z992 Dependence on renal dialysis: Secondary | ICD-10-CM | POA: Diagnosis not present

## 2020-10-25 DIAGNOSIS — N2581 Secondary hyperparathyroidism of renal origin: Secondary | ICD-10-CM | POA: Diagnosis not present

## 2020-10-25 DIAGNOSIS — N186 End stage renal disease: Secondary | ICD-10-CM | POA: Diagnosis not present

## 2020-10-25 DIAGNOSIS — Z992 Dependence on renal dialysis: Secondary | ICD-10-CM | POA: Diagnosis not present

## 2020-10-28 DIAGNOSIS — N186 End stage renal disease: Secondary | ICD-10-CM | POA: Diagnosis not present

## 2020-10-28 DIAGNOSIS — Z992 Dependence on renal dialysis: Secondary | ICD-10-CM | POA: Diagnosis not present

## 2020-10-28 DIAGNOSIS — N2581 Secondary hyperparathyroidism of renal origin: Secondary | ICD-10-CM | POA: Diagnosis not present

## 2020-10-29 DIAGNOSIS — N186 End stage renal disease: Secondary | ICD-10-CM | POA: Diagnosis not present

## 2020-10-29 DIAGNOSIS — D631 Anemia in chronic kidney disease: Secondary | ICD-10-CM | POA: Diagnosis not present

## 2020-10-29 DIAGNOSIS — I10 Essential (primary) hypertension: Secondary | ICD-10-CM | POA: Diagnosis not present

## 2020-10-29 DIAGNOSIS — I5020 Unspecified systolic (congestive) heart failure: Secondary | ICD-10-CM | POA: Diagnosis not present

## 2020-10-29 DIAGNOSIS — E7800 Pure hypercholesterolemia, unspecified: Secondary | ICD-10-CM | POA: Diagnosis not present

## 2020-10-29 DIAGNOSIS — I2510 Atherosclerotic heart disease of native coronary artery without angina pectoris: Secondary | ICD-10-CM | POA: Diagnosis not present

## 2020-10-29 DIAGNOSIS — R413 Other amnesia: Secondary | ICD-10-CM | POA: Diagnosis not present

## 2020-10-30 DIAGNOSIS — Z992 Dependence on renal dialysis: Secondary | ICD-10-CM | POA: Diagnosis not present

## 2020-10-30 DIAGNOSIS — N186 End stage renal disease: Secondary | ICD-10-CM | POA: Diagnosis not present

## 2020-10-30 DIAGNOSIS — N2581 Secondary hyperparathyroidism of renal origin: Secondary | ICD-10-CM | POA: Diagnosis not present

## 2020-11-01 DIAGNOSIS — Z992 Dependence on renal dialysis: Secondary | ICD-10-CM | POA: Diagnosis not present

## 2020-11-01 DIAGNOSIS — N186 End stage renal disease: Secondary | ICD-10-CM | POA: Diagnosis not present

## 2020-11-01 DIAGNOSIS — N2581 Secondary hyperparathyroidism of renal origin: Secondary | ICD-10-CM | POA: Diagnosis not present

## 2020-11-03 DIAGNOSIS — N2581 Secondary hyperparathyroidism of renal origin: Secondary | ICD-10-CM | POA: Diagnosis not present

## 2020-11-03 DIAGNOSIS — N186 End stage renal disease: Secondary | ICD-10-CM | POA: Diagnosis not present

## 2020-11-03 DIAGNOSIS — Z992 Dependence on renal dialysis: Secondary | ICD-10-CM | POA: Diagnosis not present

## 2020-11-05 DIAGNOSIS — Z992 Dependence on renal dialysis: Secondary | ICD-10-CM | POA: Diagnosis not present

## 2020-11-05 DIAGNOSIS — N2581 Secondary hyperparathyroidism of renal origin: Secondary | ICD-10-CM | POA: Diagnosis not present

## 2020-11-05 DIAGNOSIS — N186 End stage renal disease: Secondary | ICD-10-CM | POA: Diagnosis not present

## 2020-11-08 DIAGNOSIS — N186 End stage renal disease: Secondary | ICD-10-CM | POA: Diagnosis not present

## 2020-11-08 DIAGNOSIS — Z992 Dependence on renal dialysis: Secondary | ICD-10-CM | POA: Diagnosis not present

## 2020-11-08 DIAGNOSIS — N2581 Secondary hyperparathyroidism of renal origin: Secondary | ICD-10-CM | POA: Diagnosis not present

## 2020-11-08 IMAGING — CR DG CHEST 2V
2 series · 2 of 2 positions shown · non-contrast
Comparison: 03/15/2020

CLINICAL DATA: Chest pain

EXAM:
CHEST - 2 VIEW

[chest lat]
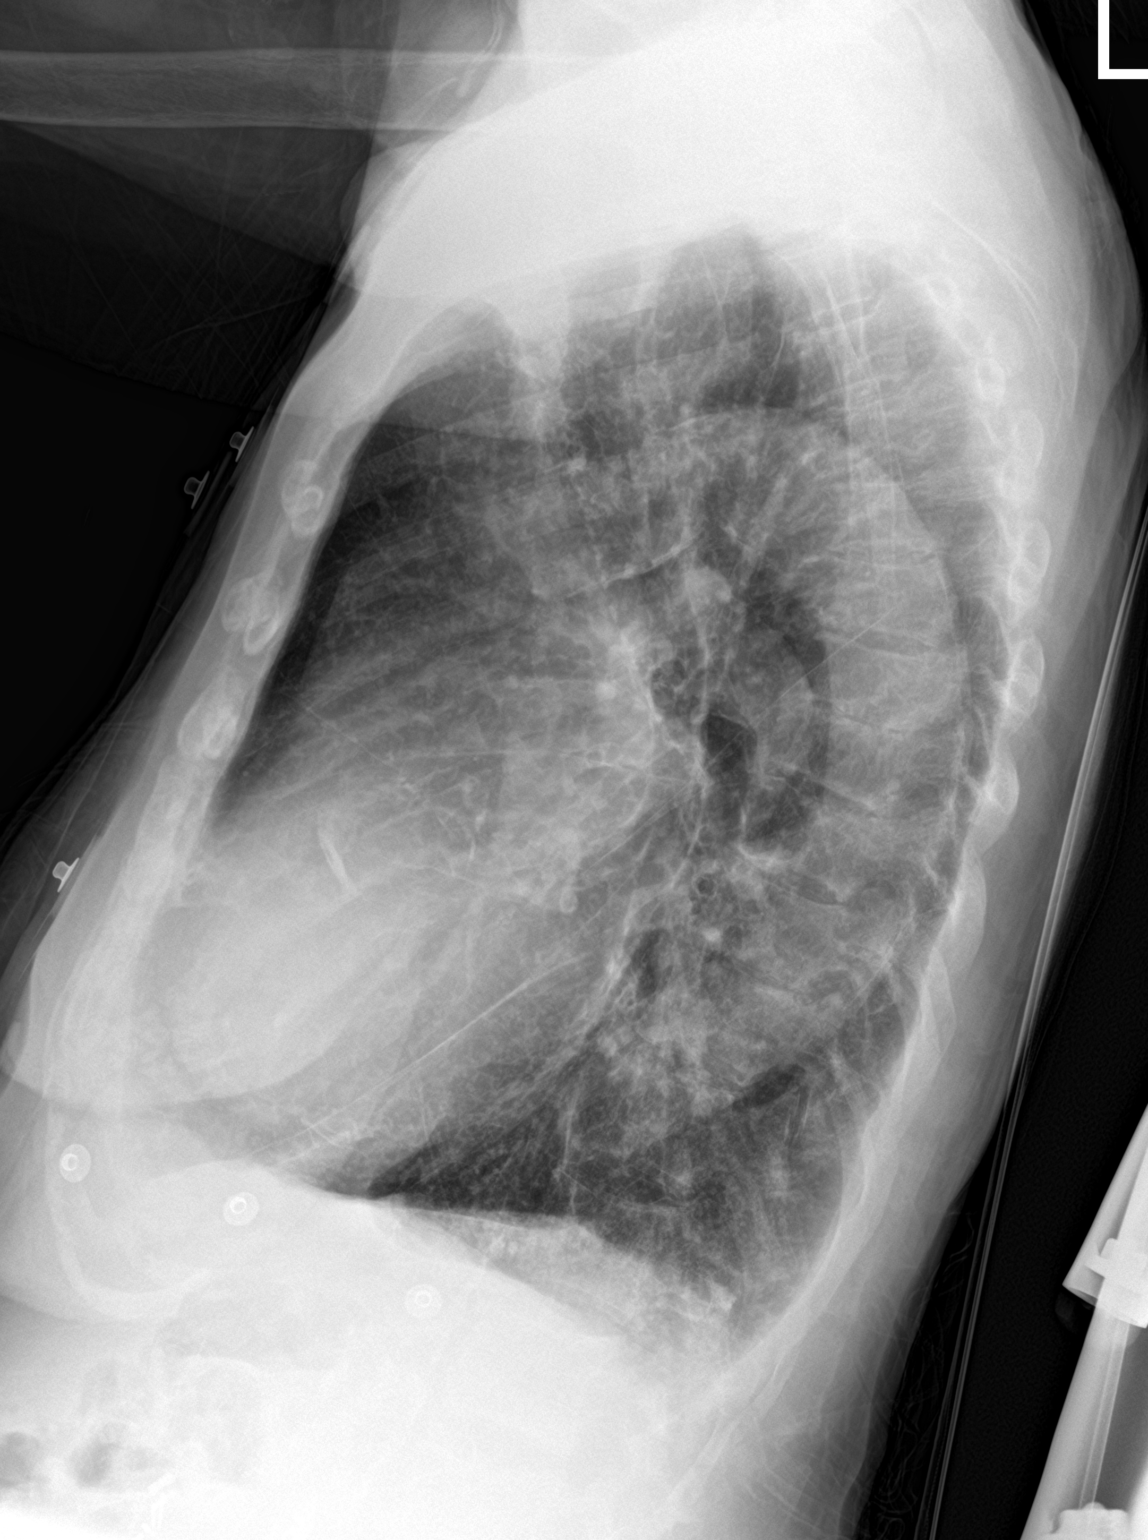

[chest ap]
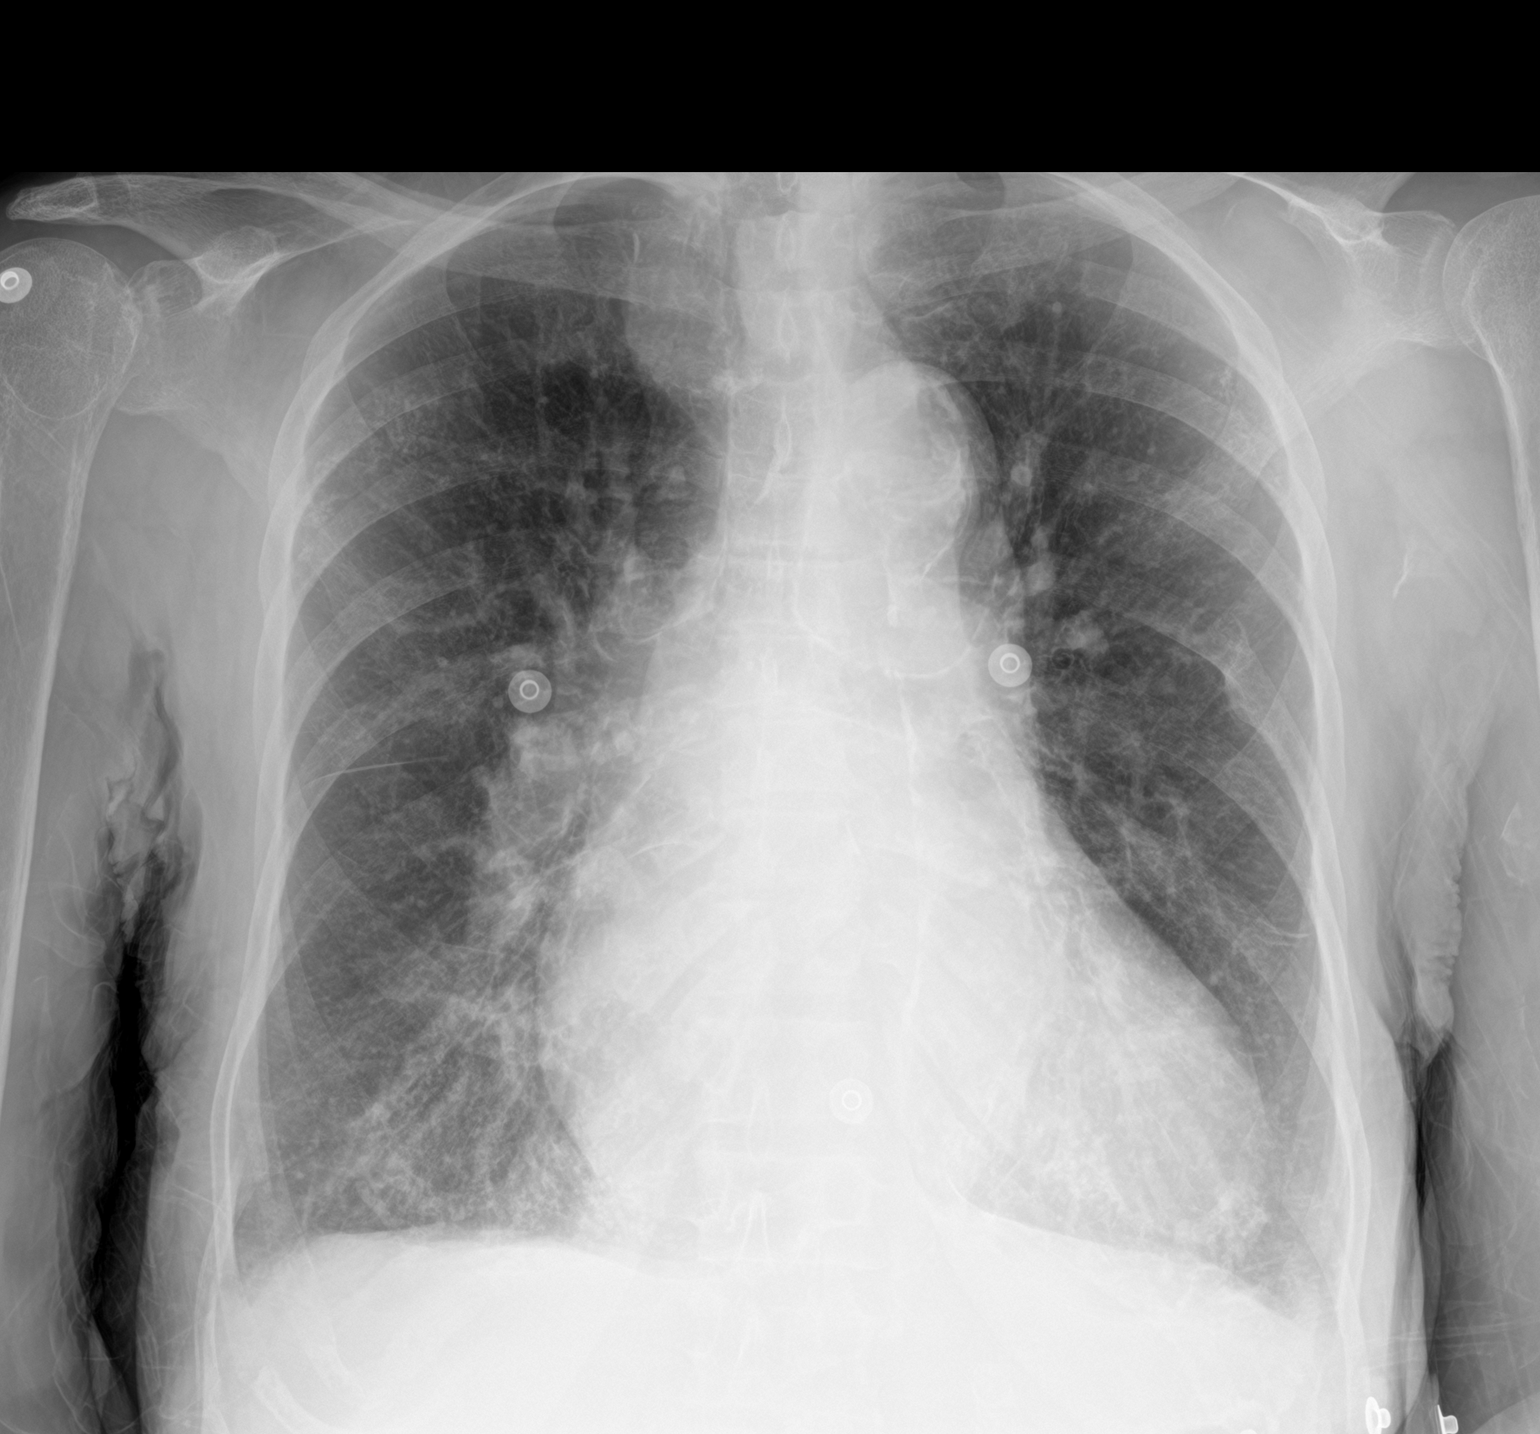

[2 of 2 positions shown; findings below may reference images not displayed]

FINDINGS: Cardiomegaly. Aortic atherosclerosis. There is hyperinflation of the
lungs compatible with COPD. Vascular congestion. Bibasilar
opacities, likely atelectasis or scarring. No effusions or acute
bony abnormality.
IMPRESSION: COPD.

Cardiomegaly, vascular congestion.  Aortic atherosclerosis.

Bibasilar atelectasis or scarring.

## 2020-11-11 DIAGNOSIS — N186 End stage renal disease: Secondary | ICD-10-CM | POA: Diagnosis not present

## 2020-11-11 DIAGNOSIS — N2581 Secondary hyperparathyroidism of renal origin: Secondary | ICD-10-CM | POA: Diagnosis not present

## 2020-11-11 DIAGNOSIS — Z992 Dependence on renal dialysis: Secondary | ICD-10-CM | POA: Diagnosis not present

## 2020-11-12 DIAGNOSIS — T8612 Kidney transplant failure: Secondary | ICD-10-CM | POA: Diagnosis not present

## 2020-11-12 DIAGNOSIS — Z992 Dependence on renal dialysis: Secondary | ICD-10-CM | POA: Diagnosis not present

## 2020-11-12 DIAGNOSIS — N186 End stage renal disease: Secondary | ICD-10-CM | POA: Diagnosis not present

## 2020-11-13 DIAGNOSIS — N2581 Secondary hyperparathyroidism of renal origin: Secondary | ICD-10-CM | POA: Diagnosis not present

## 2020-11-13 DIAGNOSIS — N186 End stage renal disease: Secondary | ICD-10-CM | POA: Diagnosis not present

## 2020-11-13 DIAGNOSIS — Z992 Dependence on renal dialysis: Secondary | ICD-10-CM | POA: Diagnosis not present

## 2020-11-15 DIAGNOSIS — N2581 Secondary hyperparathyroidism of renal origin: Secondary | ICD-10-CM | POA: Diagnosis not present

## 2020-11-15 DIAGNOSIS — N186 End stage renal disease: Secondary | ICD-10-CM | POA: Diagnosis not present

## 2020-11-15 DIAGNOSIS — Z992 Dependence on renal dialysis: Secondary | ICD-10-CM | POA: Diagnosis not present

## 2020-11-18 DIAGNOSIS — N186 End stage renal disease: Secondary | ICD-10-CM | POA: Diagnosis not present

## 2020-11-18 DIAGNOSIS — Z992 Dependence on renal dialysis: Secondary | ICD-10-CM | POA: Diagnosis not present

## 2020-11-18 DIAGNOSIS — N2581 Secondary hyperparathyroidism of renal origin: Secondary | ICD-10-CM | POA: Diagnosis not present

## 2020-11-20 DIAGNOSIS — N2581 Secondary hyperparathyroidism of renal origin: Secondary | ICD-10-CM | POA: Diagnosis not present

## 2020-11-20 DIAGNOSIS — N186 End stage renal disease: Secondary | ICD-10-CM | POA: Diagnosis not present

## 2020-11-20 DIAGNOSIS — Z992 Dependence on renal dialysis: Secondary | ICD-10-CM | POA: Diagnosis not present

## 2020-11-22 DIAGNOSIS — N2581 Secondary hyperparathyroidism of renal origin: Secondary | ICD-10-CM | POA: Diagnosis not present

## 2020-11-22 DIAGNOSIS — N186 End stage renal disease: Secondary | ICD-10-CM | POA: Diagnosis not present

## 2020-11-22 DIAGNOSIS — Z992 Dependence on renal dialysis: Secondary | ICD-10-CM | POA: Diagnosis not present

## 2020-11-25 DIAGNOSIS — Z992 Dependence on renal dialysis: Secondary | ICD-10-CM | POA: Diagnosis not present

## 2020-11-25 DIAGNOSIS — N2581 Secondary hyperparathyroidism of renal origin: Secondary | ICD-10-CM | POA: Diagnosis not present

## 2020-11-25 DIAGNOSIS — N186 End stage renal disease: Secondary | ICD-10-CM | POA: Diagnosis not present

## 2020-11-27 DIAGNOSIS — N186 End stage renal disease: Secondary | ICD-10-CM | POA: Diagnosis not present

## 2020-11-27 DIAGNOSIS — Z992 Dependence on renal dialysis: Secondary | ICD-10-CM | POA: Diagnosis not present

## 2020-11-27 DIAGNOSIS — N2581 Secondary hyperparathyroidism of renal origin: Secondary | ICD-10-CM | POA: Diagnosis not present

## 2020-11-29 DIAGNOSIS — N186 End stage renal disease: Secondary | ICD-10-CM | POA: Diagnosis not present

## 2020-11-29 DIAGNOSIS — N2581 Secondary hyperparathyroidism of renal origin: Secondary | ICD-10-CM | POA: Diagnosis not present

## 2020-11-29 DIAGNOSIS — Z992 Dependence on renal dialysis: Secondary | ICD-10-CM | POA: Diagnosis not present

## 2020-12-02 DIAGNOSIS — N186 End stage renal disease: Secondary | ICD-10-CM | POA: Diagnosis not present

## 2020-12-02 DIAGNOSIS — N2581 Secondary hyperparathyroidism of renal origin: Secondary | ICD-10-CM | POA: Diagnosis not present

## 2020-12-02 DIAGNOSIS — Z992 Dependence on renal dialysis: Secondary | ICD-10-CM | POA: Diagnosis not present

## 2020-12-04 DIAGNOSIS — N186 End stage renal disease: Secondary | ICD-10-CM | POA: Diagnosis not present

## 2020-12-04 DIAGNOSIS — Z992 Dependence on renal dialysis: Secondary | ICD-10-CM | POA: Diagnosis not present

## 2020-12-04 DIAGNOSIS — N2581 Secondary hyperparathyroidism of renal origin: Secondary | ICD-10-CM | POA: Diagnosis not present

## 2020-12-05 DIAGNOSIS — Z23 Encounter for immunization: Secondary | ICD-10-CM | POA: Diagnosis not present

## 2020-12-06 DIAGNOSIS — N2581 Secondary hyperparathyroidism of renal origin: Secondary | ICD-10-CM | POA: Diagnosis not present

## 2020-12-06 DIAGNOSIS — N186 End stage renal disease: Secondary | ICD-10-CM | POA: Diagnosis not present

## 2020-12-06 DIAGNOSIS — Z992 Dependence on renal dialysis: Secondary | ICD-10-CM | POA: Diagnosis not present

## 2020-12-09 DIAGNOSIS — Z992 Dependence on renal dialysis: Secondary | ICD-10-CM | POA: Diagnosis not present

## 2020-12-09 DIAGNOSIS — N2581 Secondary hyperparathyroidism of renal origin: Secondary | ICD-10-CM | POA: Diagnosis not present

## 2020-12-09 DIAGNOSIS — N186 End stage renal disease: Secondary | ICD-10-CM | POA: Diagnosis not present

## 2020-12-11 DIAGNOSIS — N186 End stage renal disease: Secondary | ICD-10-CM | POA: Diagnosis not present

## 2020-12-11 DIAGNOSIS — Z992 Dependence on renal dialysis: Secondary | ICD-10-CM | POA: Diagnosis not present

## 2020-12-11 DIAGNOSIS — N2581 Secondary hyperparathyroidism of renal origin: Secondary | ICD-10-CM | POA: Diagnosis not present

## 2020-12-13 DIAGNOSIS — Z992 Dependence on renal dialysis: Secondary | ICD-10-CM | POA: Diagnosis not present

## 2020-12-13 DIAGNOSIS — N186 End stage renal disease: Secondary | ICD-10-CM | POA: Diagnosis not present

## 2020-12-13 DIAGNOSIS — T8612 Kidney transplant failure: Secondary | ICD-10-CM | POA: Diagnosis not present

## 2020-12-13 DIAGNOSIS — N2581 Secondary hyperparathyroidism of renal origin: Secondary | ICD-10-CM | POA: Diagnosis not present

## 2020-12-15 DIAGNOSIS — R079 Chest pain, unspecified: Secondary | ICD-10-CM | POA: Diagnosis not present

## 2020-12-15 DIAGNOSIS — R0602 Shortness of breath: Secondary | ICD-10-CM | POA: Diagnosis not present

## 2020-12-15 DIAGNOSIS — I120 Hypertensive chronic kidney disease with stage 5 chronic kidney disease or end stage renal disease: Secondary | ICD-10-CM | POA: Diagnosis not present

## 2020-12-15 DIAGNOSIS — R0789 Other chest pain: Secondary | ICD-10-CM | POA: Diagnosis not present

## 2020-12-15 DIAGNOSIS — N186 End stage renal disease: Secondary | ICD-10-CM | POA: Diagnosis not present

## 2020-12-15 DIAGNOSIS — Z992 Dependence on renal dialysis: Secondary | ICD-10-CM | POA: Diagnosis not present

## 2020-12-15 DIAGNOSIS — I10 Essential (primary) hypertension: Secondary | ICD-10-CM | POA: Diagnosis not present

## 2020-12-16 ENCOUNTER — Other Ambulatory Visit: Payer: Self-pay

## 2020-12-16 ENCOUNTER — Emergency Department (HOSPITAL_COMMUNITY): Payer: Medicare HMO

## 2020-12-16 ENCOUNTER — Emergency Department (HOSPITAL_COMMUNITY)
Admission: EM | Admit: 2020-12-16 | Discharge: 2020-12-16 | Disposition: A | Discharge status: Home / Self Care | Payer: Medicare HMO | Attending: Emergency Medicine | Admitting: Emergency Medicine

## 2020-12-16 DIAGNOSIS — Z87891 Personal history of nicotine dependence: Secondary | ICD-10-CM | POA: Insufficient documentation

## 2020-12-16 DIAGNOSIS — Z992 Dependence on renal dialysis: Secondary | ICD-10-CM | POA: Insufficient documentation

## 2020-12-16 DIAGNOSIS — N186 End stage renal disease: Secondary | ICD-10-CM | POA: Diagnosis not present

## 2020-12-16 DIAGNOSIS — I509 Heart failure, unspecified: Secondary | ICD-10-CM | POA: Diagnosis not present

## 2020-12-16 DIAGNOSIS — R110 Nausea: Secondary | ICD-10-CM | POA: Insufficient documentation

## 2020-12-16 DIAGNOSIS — Z7982 Long term (current) use of aspirin: Secondary | ICD-10-CM | POA: Insufficient documentation

## 2020-12-16 DIAGNOSIS — R0602 Shortness of breath: Secondary | ICD-10-CM | POA: Diagnosis not present

## 2020-12-16 DIAGNOSIS — R079 Chest pain, unspecified: Secondary | ICD-10-CM | POA: Diagnosis not present

## 2020-12-16 DIAGNOSIS — I132 Hypertensive heart and chronic kidney disease with heart failure and with stage 5 chronic kidney disease, or end stage renal disease: Secondary | ICD-10-CM | POA: Insufficient documentation

## 2020-12-16 DIAGNOSIS — I25110 Atherosclerotic heart disease of native coronary artery with unstable angina pectoris: Secondary | ICD-10-CM | POA: Diagnosis not present

## 2020-12-16 DIAGNOSIS — Z79899 Other long term (current) drug therapy: Secondary | ICD-10-CM | POA: Insufficient documentation

## 2020-12-16 DIAGNOSIS — Z940 Kidney transplant status: Secondary | ICD-10-CM | POA: Diagnosis not present

## 2020-12-16 DIAGNOSIS — I120 Hypertensive chronic kidney disease with stage 5 chronic kidney disease or end stage renal disease: Secondary | ICD-10-CM | POA: Diagnosis not present

## 2020-12-16 DIAGNOSIS — I517 Cardiomegaly: Secondary | ICD-10-CM | POA: Diagnosis not present

## 2020-12-16 DIAGNOSIS — N2581 Secondary hyperparathyroidism of renal origin: Secondary | ICD-10-CM | POA: Diagnosis not present

## 2020-12-16 LAB — CBC
HCT: 27.9 % — ABNORMAL LOW (ref 36.0–46.0)
Hemoglobin: 9.2 g/dL — ABNORMAL LOW (ref 12.0–15.0)
MCH: 30.9 pg (ref 26.0–34.0)
MCHC: 33 g/dL (ref 30.0–36.0)
MCV: 93.6 fL (ref 80.0–100.0)
Platelets: 255 10*3/uL (ref 150–400)
RBC: 2.98 MIL/uL — ABNORMAL LOW (ref 3.87–5.11)
RDW: 18.2 % — ABNORMAL HIGH (ref 11.5–15.5)
WBC: 12.1 10*3/uL — ABNORMAL HIGH (ref 4.0–10.5)
nRBC: 0 % (ref 0.0–0.2)

## 2020-12-16 LAB — BASIC METABOLIC PANEL
Anion gap: 21 — ABNORMAL HIGH (ref 5–15)
BUN: 49 mg/dL — ABNORMAL HIGH (ref 8–23)
CO2: 25 mmol/L (ref 22–32)
Calcium: 9.7 mg/dL (ref 8.9–10.3)
Chloride: 95 mmol/L — ABNORMAL LOW (ref 98–111)
Creatinine, Ser: 7.52 mg/dL — ABNORMAL HIGH (ref 0.44–1.00)
GFR, Estimated: 5 mL/min — ABNORMAL LOW (ref >60)
Glucose, Bld: 141 mg/dL — ABNORMAL HIGH (ref 70–99)
Potassium: 4.4 mmol/L (ref 3.5–5.1)
Sodium: 141 mmol/L (ref 135–145)

## 2020-12-16 LAB — TROPONIN I (HIGH SENSITIVITY)
Troponin I (High Sensitivity): 141 ng/L (ref <18)
Troponin I (High Sensitivity): 154 ng/L (ref <18)
Troponin I (High Sensitivity): 148 ng/L (ref <18)

## 2020-12-16 NOTE — ED Provider Notes (Signed)
Pt continues to feel well.  She is due for dialysis today at noon.  3rd troponin is stable.  I spoke with her sister who is able to take her home and then get her to dialysis.  She knows to return if worse.   Isla Pence, MD 12/16/20 [phone]

## 2020-12-16 NOTE — ED Triage Notes (Signed)
Pt presents to ED BIB [facility]. Pt c/o mid CP that radiates towards right x2d. Pt also c/o increased SOB. M/w/f dialysis, compliant w/ treatment. EMS given 324 asa.  EMS VS -  168/68 HR - 90

## 2020-12-16 NOTE — ED Provider Notes (Signed)
[facility] EMERGENCY DEPARTMENT Provider Note  CSN: [id number] Arrival date & time: 12/16/20 0011  Chief Complaint(s) Chest Pain  HPI [name] is a 70 y.o. female with a past medical history listed below including ESRD on dialysis MWF, heart failure who presents to the emergency department with shortness of breath. Onset was last evening around 9 PM. Occurred while exerting herself going up and down a short flight of stairs to take care of her dog. She reports that for the past couple of days she has had intermittent chest pain described as pinpricks on her right chest.  These are different from the chest pain she had in May/June where she had an NSTEMI/CHF exacerbation. She is currently asymptomatic. She denies any recent fevers or infections. No coughing or congestion. No abdominal pain. She endorsed nausea but not associated with the symptoms. No emesis. No diarrhea.  Patient is up-to-date on her Covid vaccines including booster.    HPI  Past Medical History Past Medical History:  Diagnosis Date  . Adenomatous polyp of colon 10/2010, 2006, 2015  . Anemia in CKD (chronic kidney disease) 11/07/2012   s/p blood transfusion.   . Arthritis   . CAD (coronary artery disease)    "something like that"  . CHF (congestive heart failure) ([place])   . Constipation   . Depression with anxiety   . Diverticula, colon   . 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  Had some early rejection then stable function for years, then had slow decline of function and went back on hemodialysis in 2012.  Gets HD TTS schedule at [facility] on [facility] still using L forearm AVF.     Marland Kitchen GERD (gastroesophageal reflux disease)   . GI bleed 2017   felt to be ischemic colitis, last colo 2015  . Headache   . Hyperlipidemia   . Hypertension   . Neurologic gait dysfunction   . Neuromuscular  disorder (HCC)    neuropathy hand and legs  . Osteoporosis   . Pneumonia   . 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 Pulmonary edema 12/2019  . Stroke [facility]) 11/2015   TIA  . Weight loss, unintentional    Patient Active Problem List   Diagnosis Date Noted  . NSTEMI (non-ST elevated myocardial infarction) ([place]) 05/14/2020  . Volume overload 05/13/2020  . Hypotension after procedure 03/16/2020  . Chest pain 03/16/2020  . Change in mental state 03/15/2020  . Ventricular tachycardia ([place]) 03/15/2020  . Seizure ([place]) 03/15/2020  . Unresponsive episode 03/15/2020  . Pulmonary edema 01/01/2020  . Complication of vascular access for dialysis 12/21/2019  . Breakdown (mechanical) of surgically created arteriovenous fistula, initial encounter ([place]) 12/18/2019  . Weakness 10/11/2019  . 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 Dependence on renal dialysis ([place]) 07/24/2019  . Iron deficiency anemia, unspecified 06/09/2019  . Age-related osteoporosis without current pathological fracture 04/17/2019  . 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 Coagulation defect, unspecified ([place]) 04/17/2019  . Diarrhea, unspecified 04/17/2019  . Encounter for screening for depression 04/17/2019  . Headache, unspecified 04/17/2019  . 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 Pain, unspecified 04/17/2019  . Polyp of colon 04/17/2019  . Primary generalized (osteo)arthritis 04/17/2019  . Pruritus, unspecified 04/17/2019  . Pure hypercholesterolemia, unspecified 04/17/2019  . Secondary hyperparathyroidism of renal origin ([place]) 04/17/2019  .  Gastro-esophageal reflux disease without esophagitis 04/17/2019  . Essential (primary) hypertension 04/17/2019  . Hyperlipidemia, unspecified 04/17/2019  . Transient cerebral ischemic attack, unspecified 04/17/2019  . Hypoxemia 12/13/2018  . Atrial fibrillation ([place]) 03/23/2017  . Prolonged Q-T interval on ECG  02/24/2017  . Hypokalemia 02/24/2017  . Acute ischemic stroke ([place]) 02/23/2017  . Malnutrition of moderate degree 12/24/2016  . Problem with dialysis access ([place]) 12/21/2016  . Acute ischemic colitis ([place]) 05/16/2016  . Colitis 05/15/2016  . Rectal bleeding 05/15/2016  . Neurologic abnormality 11/19/2015  . Hyperkalemia 11/19/2015  . Anxiety 11/19/2015  . Insomnia 11/19/2015  . Gait instability   . Stroke-like symptom 11/16/2015  . Vestibular neuritis 11/16/2015  . Stroke (cerebrum) ([place]) 11/16/2015  . Dizziness 05/09/2015  . Ataxia 05/09/2015  . H/O: CVA (cerebrovascular accident) 05/09/2015  . Left facial numbness 05/09/2015  . Left leg numbness 05/09/2015  . Hyperlipidemia   . Acute on chronic diastolic heart failure ([place])   . Shortness of breath 04/01/2013  . HTN (hypertension) 04/01/2013  . Cholecystitis, acute 11/07/2012  . End-stage renal disease on hemodialysis ([place]) 11/07/2012  . Anemia in chronic kidney disease 11/07/2012   Home Medication(s) Prior to Admission medications   Medication Sig Start Date End Date Taking? Authorizing Provider  acetaminophen (TYLENOL) 500 MG tablet Take 1 tablet (500 mg total) by mouth every 6 (six) hours as needed. 09/05/18   Law, Bea Graff, PA-C  allopurinol (ZYLOPRIM) 300 MG tablet Take 150 mg by mouth daily.     [provider]  ALPRAZolam Duanne Moron) 0.25 MG tablet Take 0.25 mg by mouth daily as needed for anxiety.     [provider]  aspirin EC 81 MG EC tablet Take 1 tablet (81 mg total) by mouth daily. 05/14/20   Black, Lezlie Octave, NP  atorvastatin (LIPITOR) 40 MG tablet Take 1 tablet (40 mg total) by mouth daily. 04/02/20 07/05/29  Amin, Ankit Chirag, MD  AURYXIA 1 GM 210 MG(Fe) tablet Take 420 mg by mouth 3 (three) times daily with meals.     [provider]  B Complex-C-Folic Acid (DIALYVITE 287) 0.8 MG WAFR Take 1 tablet by mouth daily.  10/25/18   [provider]  carvedilol (COREG) 6.25 MG tablet Take  6.25 mg by mouth 2 (two) times daily. 08/28/20   [provider]  cinacalcet (SENSIPAR) 30 MG tablet Take 30 mg by mouth daily. 01/04/20   [provider]  lactulose (CHRONULAC) 10 GM/15ML solution Take 20 g by mouth daily as needed for mild constipation.    [provider]  losartan (COZAAR) 50 MG tablet Take 50 mg by mouth daily. 09/25/20   [provider]  ondansetron (ZOFRAN) 8 MG tablet Take 8 mg by mouth 2 (two) times daily as needed for nausea or vomiting.     [provider]  zolpidem (AMBIEN) 5 MG tablet Take 1 tablet (5 mg total) by mouth at bedtime as needed for sleep. 07/27/20   Donne Hazel, MD  Past Surgical History Past Surgical History:  Procedure Laterality Date  . AV FISTULA PLACEMENT     for dialysis  . AV FISTULA PLACEMENT Left 11/22/2015   Procedure: ARTERIOVENOUS (AV) FISTULA CREATION-LEFT BRACHIOCEPHALIC;  Surgeon: Serafina Mitchell, MD;  Location: [place];  Service: Vascular;  Laterality: Left;  . AV FISTULA PLACEMENT Right 03/15/2020   Procedure: INSERTION OF ARTERIOVENOUS (AV) GORE-TEX GRAFT ARM ( BRACHIAL AXILLARY );  Surgeon: Katha Cabal, MD;  Location: [facility];  Service: Vascular;  Laterality: Right;  . BACK SURGERY    . CERVICAL FUSION    . CHOLECYSTECTOMY  12/02/2012   Procedure: LAPAROSCOPIC CHOLECYSTECTOMY WITH INTRAOPERATIVE CHOLANGIOGRAM;  Surgeon: Edward Jolly, MD;  Location: [place];  Service: General;  Laterality: N/A;  . EYE SURGERY Bilateral    cataract surgery  . EYE SURGERY Left 2019   laser  . HEMATOMA EVACUATION Left 12/24/2016   Procedure: EVACUATION HEMATOMA LEFT UPPER ARM;  Surgeon: Waynetta Sandy, MD;  Location: [place];  Service: Vascular;  Laterality: Left;  . I & D EXTREMITY Left 12/31/2016   Procedure: IRRIGATION AND DEBRIDEMENT EXTREMITY;  Surgeon:  Angelia Mould, MD;  Location: [place];  Service: Vascular;  Laterality: Left;  . INSERTION OF DIALYSIS CATHETER Right 12/24/2016   Procedure: INSERTION OF DIALYSIS CATHETER;  Surgeon: Waynetta Sandy, MD;  Location: [place];  Service: Vascular;  Laterality: Right;  . INSERTION OF DIALYSIS CATHETER Right 02/04/2017   Procedure: INSERTION OF DIALYSIS CATHETER;  Surgeon: Waynetta Sandy, MD;  Location: [place];  Service: Vascular;  Laterality: Right;  . KIDNEY TRANSPLANT  1996  . PERIPHERAL VASCULAR CATHETERIZATION Left 10/23/2016   Procedure: Fistulagram;  Surgeon: Elam Dutch, MD;  Location: [place] [facility];  Service: Cardiovascular;  Laterality: Left;  . PERIPHERAL VASCULAR THROMBECTOMY Right 04/16/2020   Procedure: PERIPHERAL VASCULAR THROMBECTOMY;  Surgeon: Katha Cabal, MD;  Location: [place] [facility];  Service: Cardiovascular;  Laterality: Right;  . RESECTION OF ARTERIOVENOUS FISTULA ANEURYSM Left 11/22/2015   Procedure: RESECTION OF LEFT RADIOCEPHALIC FISTULA ANEURYSM ;  Surgeon: Serafina Mitchell, MD;  Location: [place];  Service: Vascular;  Laterality: Left;  . REVISON OF ARTERIOVENOUS FISTULA Left 12/22/2016   Procedure: REVISON OF LEFT ARTERIOVENOUS FISTULA;  Surgeon: Waynetta Sandy, MD;  Location: [place];  Service: Vascular;  Laterality: Left;  . REVISON OF ARTERIOVENOUS FISTULA Left 02/04/2017   Procedure: REVISON OF LEFT UPPER ARM ARTERIOVENOUS FISTULA;  Surgeon: Waynetta Sandy, MD;  Location: [place];  Service: Vascular;  Laterality: Left;  . UPPER EXTREMITY ANGIOGRAPHY Bilateral 09/19/2019   Procedure: UPPER EXTREMITY ANGIOGRAPHY;  Surgeon: Katha Cabal, MD;  Location: [place] [facility];  Service: Cardiovascular;  Laterality: Bilateral;   Family History Family History  Problem Relation Age of Onset  . Colon cancer Brother   . Cancer Brother   . Coronary artery disease Mother 50  . Hyperlipidemia Mother   . Hypertension Mother    . Stroke Maternal Aunt   . Esophageal cancer Neg Hx   . Stomach cancer Neg Hx   . Rectal cancer Neg Hx     Social History Social History   Tobacco Use  . Smoking status: Former Smoker    Types: Cigarettes    Quit date: 12/31/1991    Years since quitting: 28.9  . Smokeless tobacco: Never Used  Vaping Use  . Vaping Use: Never used  Substance Use Topics  . Alcohol use: No  Alcohol/week: 0.0 standard drinks  . Drug use: No   Allergies Sulfa antibiotics and Adhesive [tape]  Review of Systems Review of Systems All other systems are reviewed and are negative for acute change except as noted in the HPI  Physical Exam Vital Signs  I have reviewed the triage vital signs BP (!) 150/82   Pulse 88   Temp 97.8 F (36.6 C) (Oral)   Resp 17   Ht 5\' 2"  (1.575 m)   Wt 48.3 kg   SpO2 98%   BMI 19.48 kg/m   Physical Exam Vitals reviewed.  Constitutional:      General: She is not in acute distress.    Appearance: She is well-developed and well-nourished. She is not diaphoretic.  HENT:     Head: Normocephalic and atraumatic.     Nose: Nose normal.  Eyes:     General: No scleral icterus.       Right eye: No discharge.        Left eye: No discharge.     Extraocular Movements: EOM normal.     Conjunctiva/sclera: Conjunctivae normal.     Pupils: Pupils are equal, round, and reactive to light.  Cardiovascular:     Rate and Rhythm: Normal rate and regular rhythm.     Heart sounds: No murmur heard. No friction rub. No gallop.      Arteriovenous access: right and left arteriovenous access is present. Pulmonary:     Effort: Pulmonary effort is normal. No respiratory distress.     Breath sounds: Normal breath sounds. No stridor. No rales.  Abdominal:     General: There is no distension.     Palpations: Abdomen is soft.     Tenderness: There is no abdominal tenderness.  Musculoskeletal:        General: No tenderness or edema.     Cervical back: Normal range of motion and  neck supple.  Skin:    General: Skin is warm and dry.     Findings: No erythema or rash.  Neurological:     Mental Status: She is alert and oriented to person, place, and time.  Psychiatric:        Mood and Affect: Mood and affect normal.     ED Results and Treatments Labs (all labs ordered are listed, but only abnormal results are displayed) Labs Reviewed  BASIC METABOLIC PANEL - Abnormal; Notable for the following components:      Result Value   Chloride 95 (*)    Glucose, Bld 141 (*)    BUN 49 (*)    Creatinine, Ser 7.52 (*)    GFR, Estimated 5 (*)    Anion gap 21 (*)    All other components within normal limits  CBC - Abnormal; Notable for the following components:   WBC 12.1 (*)    RBC 2.98 (*)    Hemoglobin 9.2 (*)    HCT 27.9 (*)    RDW 18.2 (*)    All other components within normal limits  TROPONIN I (HIGH SENSITIVITY) - Abnormal; Notable for the following components:   Troponin I (High Sensitivity) 141 (*)    All other components within normal limits  TROPONIN I (HIGH SENSITIVITY) - Abnormal; Notable for the following components:   Troponin I (High Sensitivity) 154 (*)    All other components within normal limits  TROPONIN I (HIGH SENSITIVITY)  EKG  EKG Interpretation  Date/Time:  Monday December 16 2020 00:21:24 EST Ventricular Rate:  98 PR Interval:  172 QRS Duration: 78 QT Interval:  368 QTC Calculation: 469 R Axis:   67 Text Interpretation: Normal sinus rhythm Nonspecific ST abnormality Abnormal ECG NO STEMI. Confirmed by Addison Lank [phone]) on 12/16/2020 6:09:28 AM      Radiology DG Chest 1 View  Result Date: 12/16/2020 CLINICAL DATA:  Chest pain and shortness of breath EXAM: CHEST  1 VIEW COMPARISON:  October 14, 2020 FINDINGS: The heart size and mediastinal contours are mildly enlarged. Increased interstitial markings are seen  throughout both lungs. No pleural effusion is seen. Aortic knob calcifications are seen. The visualized skeletal structures are unremarkable. IMPRESSION: Cardiomegaly and interstitial edema Electronically Signed   By: Prudencio Pair M.D.   On: 12/16/2020 00:51    Pertinent labs & imaging results that were available during my care of the patient were reviewed by me and considered in my medical decision making (see chart for details).  Medications Ordered in ED Medications - No data to display                                                                                                                                  Procedures Procedures  (including critical care time)  Medical Decision Making / ED Course I have reviewed the nursing notes for this encounter and the patient's prior records (if available in EHR or on provided paperwork).   SYREETA FIGLER was evaluated in Emergency Department on 12/16/2020 for the symptoms described in the history of present illness. She was evaluated in the context of the global COVID-19 pandemic, which necessitated consideration that the patient might be at risk for infection with the SARS-CoV-2 virus that causes COVID-19. Institutional protocols and algorithms that pertain to the evaluation of patients at risk for COVID-19 are in a state of rapid change based on information released by regulatory bodies including the CDC and federal and state organizations. These policies and algorithms were followed during the patient's care in the ED.  Atypical chest pain. EKG without acute ischemic changes or evidence of pericarditis. Initial troponins were elevated.  Slight uptake with a delta. Patient has had elevated troponins in the past -likely secondary to her ESRD. However given the slight increase, will obtain a third troponin to ensure there is no continue to trend up. CBC with stable hemoglobin. Metabolic panel with normal potassium.  Chest x-ray notable for  cardiomegaly mild pulmonary edema. Patient is satting well on room air with sats of 95%.  Low suspicion for pulmonary embolism.  Presentation not classic for aortic dissection or esophageal perforation.  No evidence of pneumothorax or pneumonia on chest x-ray.  If third troponin is flat, I feel patient would be appropriate to be discharged home as her dialysis session is at 12 PM today.  Patient care turned over to Dr Gilford Raid. Patient case  and results discussed in detail; please see their note for further ED managment.         Final Clinical Impression(s) / ED Diagnoses Final diagnoses:  SOB (shortness of breath)  ESRD (end stage renal disease) on dialysis [facility])      This chart was dictated using voice recognition software.  Despite best efforts to proofread,  errors can occur which can change the documentation meaning.   Fatima Blank, MD 12/16/20 [phone]

## 2020-12-18 DIAGNOSIS — N186 End stage renal disease: Secondary | ICD-10-CM | POA: Diagnosis not present

## 2020-12-18 DIAGNOSIS — Z992 Dependence on renal dialysis: Secondary | ICD-10-CM | POA: Diagnosis not present

## 2020-12-18 DIAGNOSIS — N2581 Secondary hyperparathyroidism of renal origin: Secondary | ICD-10-CM | POA: Diagnosis not present

## 2020-12-20 DIAGNOSIS — N186 End stage renal disease: Secondary | ICD-10-CM | POA: Diagnosis not present

## 2020-12-20 DIAGNOSIS — Z992 Dependence on renal dialysis: Secondary | ICD-10-CM | POA: Diagnosis not present

## 2020-12-20 DIAGNOSIS — N2581 Secondary hyperparathyroidism of renal origin: Secondary | ICD-10-CM | POA: Diagnosis not present

## 2020-12-23 DIAGNOSIS — N186 End stage renal disease: Secondary | ICD-10-CM | POA: Diagnosis not present

## 2020-12-23 DIAGNOSIS — Z992 Dependence on renal dialysis: Secondary | ICD-10-CM | POA: Diagnosis not present

## 2020-12-23 DIAGNOSIS — N2581 Secondary hyperparathyroidism of renal origin: Secondary | ICD-10-CM | POA: Diagnosis not present

## 2020-12-24 DIAGNOSIS — E440 Moderate protein-calorie malnutrition: Secondary | ICD-10-CM | POA: Diagnosis not present

## 2020-12-24 DIAGNOSIS — D631 Anemia in chronic kidney disease: Secondary | ICD-10-CM | POA: Diagnosis not present

## 2020-12-24 DIAGNOSIS — G3184 Mild cognitive impairment, so stated: Secondary | ICD-10-CM | POA: Diagnosis not present

## 2020-12-25 DIAGNOSIS — N186 End stage renal disease: Secondary | ICD-10-CM | POA: Diagnosis not present

## 2020-12-25 DIAGNOSIS — Z992 Dependence on renal dialysis: Secondary | ICD-10-CM | POA: Diagnosis not present

## 2020-12-25 DIAGNOSIS — N2581 Secondary hyperparathyroidism of renal origin: Secondary | ICD-10-CM | POA: Diagnosis not present

## 2020-12-26 ENCOUNTER — Ambulatory Visit: Payer: Medicare HMO | Admitting: Cardiovascular Disease

## 2020-12-26 ENCOUNTER — Telehealth: Payer: Self-pay | Admitting: Cardiovascular Disease

## 2020-12-26 NOTE — Telephone Encounter (Signed)
No show for appt today.   [name]

## 2020-12-26 NOTE — Progress Notes (Deleted)
No chief complaint on file.  History of Present Illness: 70 yo female with history of CAD (seen on chest CT, no prior cath), HTN, HLD, TIA, ESRD on HD, chronic diastolic CHF and anemia here today for cardiac follow up. She was hospitalized in April 2021 for placement of a RUE fistula, though post-surgical course was complicated by shock/hypotension and hyperkalemia resulting in sustained VT for which she was cardioverted to sinus. Echo at that time showed a new wall motion abnormality which was felt to be a possible stress cardiomyopathy in the setting of hypotension and recent surgery. She was subsequently admitted later that month with chest pain and SOB. Troponins were mildly elevated at that time to the 50s therefore an ischemic evaluation was deferred. Nuclear stress test March 2021 with no ischemia. Echo April 2021 with LVEF=50-55%, G1DD, distal anteroseptal and apical wall motion abnormality, moderate to severe TR. She was admitted to [facility] June 2021 with chest pain. Troponin elevated to 2900 but her chest pain resolved with fluid removal. She refused to consider a cardiac cath or cardiac CTA. She was seen in the ED 12/16/20 with right sided sharp chest pain and dyspnea. This resolved while in the ED. Troponin mildly elevated and flat [phone]).   She is here today for follow up. The patient denies any chest pain, dyspnea, palpitations, lower extremity edema, orthopnea, PND, dizziness, near syncope or syncope. *** ? Still with dyspnea   Primary Care Physician: [name], MD  Past Medical History:  Diagnosis Date  . Adenomatous polyp of colon 10/2010, 2006, 2015  . Anemia in CKD (chronic kidney disease) 11/07/2012   s/p blood transfusion.   . Arthritis   . CAD (coronary artery disease)    "something like that"  . CHF (congestive heart failure) ([place])   . Constipation   . Depression with anxiety   . Diverticula, colon   . 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  Had deceased donor kidney transplant in 1996.  Had some early rejection then stable function for years, then had slow decline of function and went back on hemodialysis in 2012.  Gets HD TTS schedule at [facility] on [facility] still using L forearm AVF.     Marland Kitchen GERD (gastroesophageal reflux disease)   . GI bleed 2017   felt to be ischemic colitis, last colo 2015  . Headache   . Hyperlipidemia   . Hypertension   . Neurologic gait dysfunction   . Neuromuscular disorder (HCC)    neuropathy hand and legs  . Osteoporosis   . Pneumonia   . 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 Pulmonary edema 12/2019  . Stroke [facility]) 11/2015   TIA  . Weight loss, unintentional     Past Surgical History:  Procedure Laterality Date  . AV FISTULA PLACEMENT     for dialysis  . AV FISTULA PLACEMENT Left 11/22/2015   Procedure: ARTERIOVENOUS (AV) FISTULA CREATION-LEFT BRACHIOCEPHALIC;  Surgeon: Serafina Mitchell, MD;  Location: [place];  Service: Vascular;  Laterality: Left;  . AV FISTULA PLACEMENT Right 03/15/2020   Procedure: INSERTION OF ARTERIOVENOUS (AV) GORE-TEX GRAFT ARM ( BRACHIAL AXILLARY );  Surgeon: Katha Cabal, MD;  Location: [facility];  Service: Vascular;  Laterality: Right;  . BACK SURGERY    . CERVICAL FUSION    . CHOLECYSTECTOMY  12/02/2012   Procedure: LAPAROSCOPIC CHOLECYSTECTOMY WITH INTRAOPERATIVE CHOLANGIOGRAM;  Surgeon: Marland Kitchen T  Hoxworth, MD;  Location: [place];  Service: General;  Laterality: N/A;  . EYE SURGERY Bilateral    cataract surgery  . EYE SURGERY Left 2019   laser  . HEMATOMA EVACUATION Left 12/24/2016   Procedure: EVACUATION HEMATOMA LEFT UPPER ARM;  Surgeon: Waynetta Sandy, MD;  Location: [place];  Service: Vascular;  Laterality: Left;  . I & D EXTREMITY Left 12/31/2016   Procedure: IRRIGATION AND DEBRIDEMENT EXTREMITY;  Surgeon: Angelia Mould, MD;  Location: [place];  Service: Vascular;  Laterality: Left;  .  INSERTION OF DIALYSIS CATHETER Right 12/24/2016   Procedure: INSERTION OF DIALYSIS CATHETER;  Surgeon: Waynetta Sandy, MD;  Location: [place];  Service: Vascular;  Laterality: Right;  . INSERTION OF DIALYSIS CATHETER Right 02/04/2017   Procedure: INSERTION OF DIALYSIS CATHETER;  Surgeon: Waynetta Sandy, MD;  Location: [place];  Service: Vascular;  Laterality: Right;  . KIDNEY TRANSPLANT  1996  . PERIPHERAL VASCULAR CATHETERIZATION Left 10/23/2016   Procedure: Fistulagram;  Surgeon: Elam Dutch, MD;  Location: [place] [facility];  Service: Cardiovascular;  Laterality: Left;  . PERIPHERAL VASCULAR THROMBECTOMY Right 04/16/2020   Procedure: PERIPHERAL VASCULAR THROMBECTOMY;  Surgeon: Katha Cabal, MD;  Location: [place] [facility];  Service: Cardiovascular;  Laterality: Right;  . RESECTION OF ARTERIOVENOUS FISTULA ANEURYSM Left 11/22/2015   Procedure: RESECTION OF LEFT RADIOCEPHALIC FISTULA ANEURYSM ;  Surgeon: Serafina Mitchell, MD;  Location: [place];  Service: Vascular;  Laterality: Left;  . REVISON OF ARTERIOVENOUS FISTULA Left 12/22/2016   Procedure: REVISON OF LEFT ARTERIOVENOUS FISTULA;  Surgeon: Waynetta Sandy, MD;  Location: [place];  Service: Vascular;  Laterality: Left;  . REVISON OF ARTERIOVENOUS FISTULA Left 02/04/2017   Procedure: REVISON OF LEFT UPPER ARM ARTERIOVENOUS FISTULA;  Surgeon: Waynetta Sandy, MD;  Location: [place];  Service: Vascular;  Laterality: Left;  . UPPER EXTREMITY ANGIOGRAPHY Bilateral 09/19/2019   Procedure: UPPER EXTREMITY ANGIOGRAPHY;  Surgeon: Katha Cabal, MD;  Location: [place] [facility];  Service: Cardiovascular;  Laterality: Bilateral;    Current Outpatient Medications  Medication Sig Dispense Refill  . acetaminophen (TYLENOL) 500 MG tablet Take 1 tablet (500 mg total) by mouth every 6 (six) hours as needed. 30 tablet 0  . allopurinol (ZYLOPRIM) 300 MG tablet Take 150 mg by mouth daily.     Marland Kitchen ALPRAZolam (XANAX)  0.25 MG tablet Take 0.25 mg by mouth daily as needed for anxiety.     Marland Kitchen aspirin EC 81 MG EC tablet Take 1 tablet (81 mg total) by mouth daily.    Marland Kitchen atorvastatin (LIPITOR) 40 MG tablet Take 1 tablet (40 mg total) by mouth daily. 30 tablet 0  . AURYXIA 1 GM 210 MG(Fe) tablet Take 420 mg by mouth 3 (three) times daily with meals.     . B Complex-C-Folic Acid (DIALYVITE 967) 0.8 MG WAFR Take 1 tablet by mouth daily.   11  . carvedilol (COREG) 6.25 MG tablet Take 6.25 mg by mouth 2 (two) times daily.    . cinacalcet (SENSIPAR) 30 MG tablet Take 30 mg by mouth daily.    Marland Kitchen lactulose (CHRONULAC) 10 GM/15ML solution Take 20 g by mouth daily as needed for mild constipation.    Marland Kitchen losartan (COZAAR) 50 MG tablet Take 50 mg by mouth daily.    . ondansetron (ZOFRAN) 8 MG tablet Take 8 mg by mouth 2 (two) times daily as needed for nausea or vomiting.   0  . zolpidem (  AMBIEN) 5 MG tablet Take 1 tablet (5 mg total) by mouth at bedtime as needed for sleep. 20 tablet 0   No current facility-administered medications for this visit.    Allergies  Allergen Reactions  . Sulfa Antibiotics Other (See Comments)    Per patient, both parents allergic-so will not take  . Adhesive [Tape] Itching    Social History   Socioeconomic History  . Marital status: Widowed    Spouse name: Not on file  . Number of children: 2  . Years of education: Not on file  . Highest education level: Not on file  Occupational History  . Not on file  Tobacco Use  . Smoking status: Former Smoker    Types: Cigarettes    Quit date: 12/31/1991    Years since quitting: 29.0  . Smokeless tobacco: Never Used  Vaping Use  . Vaping Use: Never used  Substance and Sexual Activity  . Alcohol use: No    Alcohol/week: 0.0 standard drinks  . Drug use: No  . Sexual activity: Not Currently  Other Topics Concern  . Not on file  Social History Narrative   Living with her mother    Right handed   Caffeine: only decaf clears ("no brown sodas"  or coffee d/t kidney disease)   Low potassium foods d/t kidney disease   Social Determinants of Health   Financial Resource Strain: Not on file  Food Insecurity: Not on file  Transportation Needs: Not on file  Physical Activity: Not on file  Stress: Not on file  Social Connections: Not on file  Intimate Partner Violence: Not on file    Family History  Problem Relation Age of Onset  . Colon cancer Brother   . Cancer Brother   . Coronary artery disease Mother 97  . Hyperlipidemia Mother   . Hypertension Mother   . Stroke Maternal Aunt   . Esophageal cancer Neg Hx   . Stomach cancer Neg Hx   . Rectal cancer Neg Hx     Review of Systems:  As stated in the HPI and otherwise negative.   There were no vitals taken for this visit.  *** NO blood pressure reading due to inability to use arms for her BP (HD access). Unable to obtain pressure reading in her legs.   Physical Examination:  General: Well developed, well nourished, NAD  HEENT: OP clear, mucus membranes moist  SKIN: warm, dry. No rashes. Neuro: No focal deficits  Musculoskeletal: Muscle strength 5/5 all ext  Psychiatric: Mood and affect normal  Neck: No JVD, no carotid bruits, no thyromegaly, no lymphadenopathy.  Lungs:Clear bilaterally, no wheezes, rhonci, crackles Cardiovascular: Regular rate and rhythm. No murmurs, gallops or rubs. Abdomen:Soft. Bowel sounds present. Non-tender.  Extremities: No lower extremity edema. Pulses are 2 + in the bilateral DP/PT.  EKG:  EKG is not *** ordered today. The ekg ordered today demonstrates   Recent Labs: 03/15/2020: TSH 2.793 07/21/2020: B Natriuretic Peptide >4,500.0 10/07/2020: ALT 13; Magnesium 2.5 12/16/2020: BUN 49; Creatinine, Ser 7.52; Hemoglobin 9.2; Platelets 255; Potassium 4.4; Sodium 141   Lipid Panel    Component Value Date/Time   CHOL 192 04/01/2020 0620   TRIG 131 04/01/2020 0620   HDL 45 04/01/2020 0620   CHOLHDL 4.3 04/01/2020 0620   VLDL 26 04/01/2020  0620   LDLCALC 121 (H) 04/01/2020 0620     Wt Readings from Last 3 Encounters:  12/16/20 106 lb 7.7 oz (48.3 kg)  10/08/20 106 lb 6.4 oz (  48.3 kg)  07/27/20 108 lb 0.4 oz (49 kg)     Assessment and Plan:   1. CAD without angina: Evidence of coronary artery disease on chest CT in December 2020. No ischemia on stress test in March 2021. No chest pain. (*** old note---It is not clear that her dyspnea is cardiac related. Her probability of CAD is high. We have discussed a cardiac cath if her dyspnea persists over the next few months).  Continue ASA, beta blocker and statin.    2. HTN: BP controlled per pt at HD sessions.   Current medicines are reviewed at length with the patient today.  The patient does not have concerns regarding medicines.  The following changes have been made:  no change  Labs/ tests ordered today include:  No orders of the defined types were placed in this encounter.    Disposition:   FU with me in 2-3 months   Signed, Lauree Chandler, MD 12/26/2020 6:43 AM    [facility] [address], [place], [place]  [address] Phone: [phone]; Fax: [phone]

## 2020-12-27 DIAGNOSIS — N2581 Secondary hyperparathyroidism of renal origin: Secondary | ICD-10-CM | POA: Diagnosis not present

## 2020-12-27 DIAGNOSIS — Z992 Dependence on renal dialysis: Secondary | ICD-10-CM | POA: Diagnosis not present

## 2020-12-27 DIAGNOSIS — N186 End stage renal disease: Secondary | ICD-10-CM | POA: Diagnosis not present

## 2020-12-30 DIAGNOSIS — N2581 Secondary hyperparathyroidism of renal origin: Secondary | ICD-10-CM | POA: Diagnosis not present

## 2020-12-30 DIAGNOSIS — N186 End stage renal disease: Secondary | ICD-10-CM | POA: Diagnosis not present

## 2020-12-30 DIAGNOSIS — Z992 Dependence on renal dialysis: Secondary | ICD-10-CM | POA: Diagnosis not present

## 2021-01-01 DIAGNOSIS — N186 End stage renal disease: Secondary | ICD-10-CM | POA: Diagnosis not present

## 2021-01-01 DIAGNOSIS — Z992 Dependence on renal dialysis: Secondary | ICD-10-CM | POA: Diagnosis not present

## 2021-01-01 DIAGNOSIS — N2581 Secondary hyperparathyroidism of renal origin: Secondary | ICD-10-CM | POA: Diagnosis not present

## 2021-01-03 DIAGNOSIS — N186 End stage renal disease: Secondary | ICD-10-CM | POA: Diagnosis not present

## 2021-01-03 DIAGNOSIS — N2581 Secondary hyperparathyroidism of renal origin: Secondary | ICD-10-CM | POA: Diagnosis not present

## 2021-01-03 DIAGNOSIS — Z992 Dependence on renal dialysis: Secondary | ICD-10-CM | POA: Diagnosis not present

## 2021-01-06 DIAGNOSIS — Z992 Dependence on renal dialysis: Secondary | ICD-10-CM | POA: Diagnosis not present

## 2021-01-06 DIAGNOSIS — N2581 Secondary hyperparathyroidism of renal origin: Secondary | ICD-10-CM | POA: Diagnosis not present

## 2021-01-06 DIAGNOSIS — N186 End stage renal disease: Secondary | ICD-10-CM | POA: Diagnosis not present

## 2021-01-08 DIAGNOSIS — Z992 Dependence on renal dialysis: Secondary | ICD-10-CM | POA: Diagnosis not present

## 2021-01-08 DIAGNOSIS — N2581 Secondary hyperparathyroidism of renal origin: Secondary | ICD-10-CM | POA: Diagnosis not present

## 2021-01-08 DIAGNOSIS — N186 End stage renal disease: Secondary | ICD-10-CM | POA: Diagnosis not present

## 2021-01-10 DIAGNOSIS — N2581 Secondary hyperparathyroidism of renal origin: Secondary | ICD-10-CM | POA: Diagnosis not present

## 2021-01-10 DIAGNOSIS — Z992 Dependence on renal dialysis: Secondary | ICD-10-CM | POA: Diagnosis not present

## 2021-01-10 DIAGNOSIS — N186 End stage renal disease: Secondary | ICD-10-CM | POA: Diagnosis not present

## 2021-01-13 DIAGNOSIS — Z992 Dependence on renal dialysis: Secondary | ICD-10-CM | POA: Diagnosis not present

## 2021-01-13 DIAGNOSIS — N2581 Secondary hyperparathyroidism of renal origin: Secondary | ICD-10-CM | POA: Diagnosis not present

## 2021-01-13 DIAGNOSIS — T8612 Kidney transplant failure: Secondary | ICD-10-CM | POA: Diagnosis not present

## 2021-01-13 DIAGNOSIS — N186 End stage renal disease: Secondary | ICD-10-CM | POA: Diagnosis not present

## 2021-01-15 DIAGNOSIS — Z992 Dependence on renal dialysis: Secondary | ICD-10-CM | POA: Diagnosis not present

## 2021-01-15 DIAGNOSIS — N186 End stage renal disease: Secondary | ICD-10-CM | POA: Diagnosis not present

## 2021-01-15 DIAGNOSIS — N2581 Secondary hyperparathyroidism of renal origin: Secondary | ICD-10-CM | POA: Diagnosis not present

## 2021-01-17 DIAGNOSIS — N2581 Secondary hyperparathyroidism of renal origin: Secondary | ICD-10-CM | POA: Diagnosis not present

## 2021-01-17 DIAGNOSIS — Z992 Dependence on renal dialysis: Secondary | ICD-10-CM | POA: Diagnosis not present

## 2021-01-17 DIAGNOSIS — N186 End stage renal disease: Secondary | ICD-10-CM | POA: Diagnosis not present

## 2021-01-20 DIAGNOSIS — N2581 Secondary hyperparathyroidism of renal origin: Secondary | ICD-10-CM | POA: Diagnosis not present

## 2021-01-20 DIAGNOSIS — N186 End stage renal disease: Secondary | ICD-10-CM | POA: Diagnosis not present

## 2021-01-20 DIAGNOSIS — Z992 Dependence on renal dialysis: Secondary | ICD-10-CM | POA: Diagnosis not present

## 2021-01-22 DIAGNOSIS — N2581 Secondary hyperparathyroidism of renal origin: Secondary | ICD-10-CM | POA: Diagnosis not present

## 2021-01-22 DIAGNOSIS — N186 End stage renal disease: Secondary | ICD-10-CM | POA: Diagnosis not present

## 2021-01-22 DIAGNOSIS — Z992 Dependence on renal dialysis: Secondary | ICD-10-CM | POA: Diagnosis not present

## 2021-01-24 DIAGNOSIS — N2581 Secondary hyperparathyroidism of renal origin: Secondary | ICD-10-CM | POA: Diagnosis not present

## 2021-01-24 DIAGNOSIS — N186 End stage renal disease: Secondary | ICD-10-CM | POA: Diagnosis not present

## 2021-01-24 DIAGNOSIS — Z992 Dependence on renal dialysis: Secondary | ICD-10-CM | POA: Diagnosis not present

## 2021-01-27 DIAGNOSIS — Z992 Dependence on renal dialysis: Secondary | ICD-10-CM | POA: Diagnosis not present

## 2021-01-27 DIAGNOSIS — N2581 Secondary hyperparathyroidism of renal origin: Secondary | ICD-10-CM | POA: Diagnosis not present

## 2021-01-27 DIAGNOSIS — N186 End stage renal disease: Secondary | ICD-10-CM | POA: Diagnosis not present

## 2021-01-29 DIAGNOSIS — N2581 Secondary hyperparathyroidism of renal origin: Secondary | ICD-10-CM | POA: Diagnosis not present

## 2021-01-29 DIAGNOSIS — Z992 Dependence on renal dialysis: Secondary | ICD-10-CM | POA: Diagnosis not present

## 2021-01-29 DIAGNOSIS — N186 End stage renal disease: Secondary | ICD-10-CM | POA: Diagnosis not present

## 2021-01-30 ENCOUNTER — Institutional Professional Consult (permissible substitution): Payer: Medicare HMO | Admitting: Pulmonary Disease

## 2021-01-31 DIAGNOSIS — Z992 Dependence on renal dialysis: Secondary | ICD-10-CM | POA: Diagnosis not present

## 2021-01-31 DIAGNOSIS — N2581 Secondary hyperparathyroidism of renal origin: Secondary | ICD-10-CM | POA: Diagnosis not present

## 2021-01-31 DIAGNOSIS — N186 End stage renal disease: Secondary | ICD-10-CM | POA: Diagnosis not present

## 2021-02-03 DIAGNOSIS — N2581 Secondary hyperparathyroidism of renal origin: Secondary | ICD-10-CM | POA: Diagnosis not present

## 2021-02-03 DIAGNOSIS — N186 End stage renal disease: Secondary | ICD-10-CM | POA: Diagnosis not present

## 2021-02-03 DIAGNOSIS — Z992 Dependence on renal dialysis: Secondary | ICD-10-CM | POA: Diagnosis not present

## 2021-02-05 DIAGNOSIS — Z992 Dependence on renal dialysis: Secondary | ICD-10-CM | POA: Diagnosis not present

## 2021-02-05 DIAGNOSIS — N186 End stage renal disease: Secondary | ICD-10-CM | POA: Diagnosis not present

## 2021-02-05 DIAGNOSIS — N2581 Secondary hyperparathyroidism of renal origin: Secondary | ICD-10-CM | POA: Diagnosis not present

## 2021-02-07 DIAGNOSIS — N2581 Secondary hyperparathyroidism of renal origin: Secondary | ICD-10-CM | POA: Diagnosis not present

## 2021-02-07 DIAGNOSIS — Z992 Dependence on renal dialysis: Secondary | ICD-10-CM | POA: Diagnosis not present

## 2021-02-07 DIAGNOSIS — N186 End stage renal disease: Secondary | ICD-10-CM | POA: Diagnosis not present

## 2021-02-10 DIAGNOSIS — Z992 Dependence on renal dialysis: Secondary | ICD-10-CM | POA: Diagnosis not present

## 2021-02-10 DIAGNOSIS — N2581 Secondary hyperparathyroidism of renal origin: Secondary | ICD-10-CM | POA: Diagnosis not present

## 2021-02-10 DIAGNOSIS — N186 End stage renal disease: Secondary | ICD-10-CM | POA: Diagnosis not present

## 2021-02-10 DIAGNOSIS — T8612 Kidney transplant failure: Secondary | ICD-10-CM | POA: Diagnosis not present

## 2021-02-11 IMAGING — DX DG CHEST 1V PORT
1 series · 1 of 1 positions shown · non-contrast
Comparison: [DATE], 10/11/2019.  CT 11/20/2019.

CLINICAL DATA: Shortness of breath.  Hypoxia.

EXAM:
PORTABLE CHEST 1 VIEW

[chest]
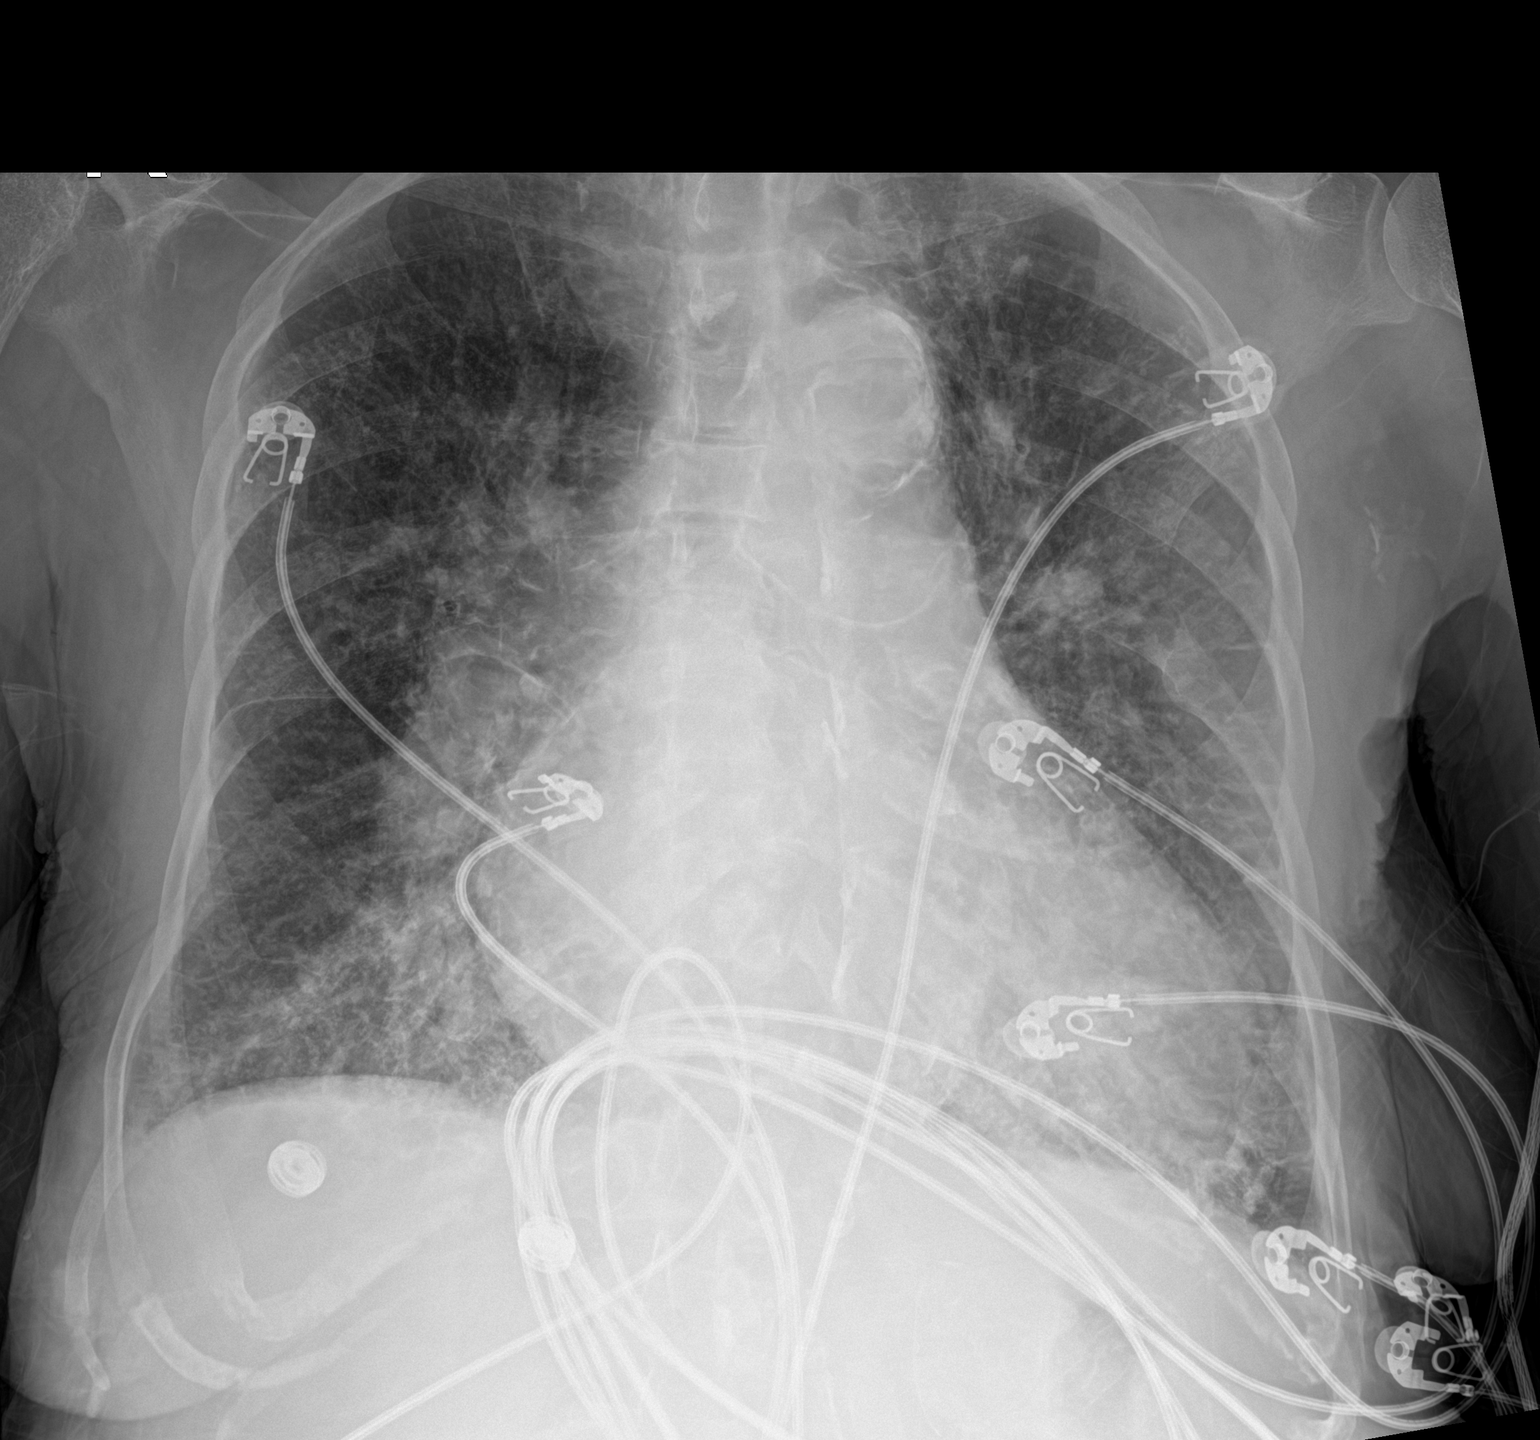

[1 of 1 positions shown; findings below may reference images not displayed]

FINDINGS: Severe cardiomegaly again noted. Prominent central pulmonary
arteries again noted suggesting pulmonary hypertension. Diffuse
interstitial prominence again noted, increased from prior exam.
Kerley B-lines noted. These findings are most consistent with CHF
with interstitial edema. Pneumonitis cannot be excluded. No pleural
effusion or pneumothorax.
IMPRESSION: 1. Severe cardiomegaly again noted. Prominent central pulmonary
arteries again noted suggesting pulmonary hypertension.

2. Diffuse interstitial prominence again noted, increased from prior
exam. Findings most consistent with CHF with interstitial edema.
Pneumonitis cannot be excluded.

## 2021-02-12 DIAGNOSIS — N186 End stage renal disease: Secondary | ICD-10-CM | POA: Diagnosis not present

## 2021-02-12 DIAGNOSIS — N2581 Secondary hyperparathyroidism of renal origin: Secondary | ICD-10-CM | POA: Diagnosis not present

## 2021-02-12 DIAGNOSIS — Z992 Dependence on renal dialysis: Secondary | ICD-10-CM | POA: Diagnosis not present

## 2021-02-13 ENCOUNTER — Ambulatory Visit (INDEPENDENT_AMBULATORY_CARE_PROVIDER_SITE_OTHER): Payer: Medicare HMO

## 2021-02-13 ENCOUNTER — Other Ambulatory Visit: Payer: Self-pay

## 2021-02-13 ENCOUNTER — Encounter: Payer: Self-pay | Admitting: Emergency Medicine

## 2021-02-13 ENCOUNTER — Ambulatory Visit: Payer: Medicare HMO | Admitting: Emergency Medicine

## 2021-02-13 DIAGNOSIS — R0602 Shortness of breath: Secondary | ICD-10-CM | POA: Diagnosis not present

## 2021-02-13 DIAGNOSIS — I517 Cardiomegaly: Secondary | ICD-10-CM | POA: Diagnosis not present

## 2021-02-13 NOTE — Progress Notes (Signed)
Subjective:    Patient ID: [name], female    DOB: August 07, 1951, 70 y.o.   MRN: [id number]  HPI 70 year old former smoker (20 pack years) with a history of end-stage renal disease on HD (failed transplant), CAD, hypertension with CHF, GERD, ischemic colitis with GI bleeding (2015), multiple hospitalizations for dyspnea that are presumed related to multifactorial volume overload and pulmonary edema.  She is referred today for evaluation of possible hypoxemia.  She describes exertional SOB with almost any activity, for example climbing stairs into the bus. She uses a walker. She believes that this is worse over the last year. She has been tolerating HD, but does have associated HA's when being treated. She can hear some "rattling" in her chest. She notes L rib soreness, more tender with insp. She is not taking deep breaths due to pain. Minimal cough. She had albuterol before in the past but not recently.    Chest x-ray 12/16/2020 reviewed by me, shows cardiomegaly with bilateral interstitial infiltrates consistent with pulmonary edema.  Echocardiogram 05/14/2020 reviewed by me, shows LVEF 55-60%, LVH, grade 2 diastolic dysfunction, increased RV size, normal RV function, severely elevated PASP.   Review of Systems As per HPI  Past Medical History:  Diagnosis Date  . Adenomatous polyp of colon 10/2010, 2006, 2015  . Anemia in CKD (chronic kidney disease) 11/07/2012   s/p blood transfusion.   . Arthritis   . CAD (coronary artery disease)    "something like that"  . CHF (congestive heart failure) ([place])   . Constipation   . Depression with anxiety   . Diverticula, colon   . 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  Had some early rejection then stable function for years, then had slow decline of function and went back on hemodialysis in 2012.  Gets HD TTS schedule at [facility] still  using L forearm AVF.     Marland Kitchen GERD (gastroesophageal reflux disease)   . GI bleed 2017   felt to be ischemic colitis, last colo 2015  . Headache   . Hyperlipidemia   . Hypertension   . Neurologic gait dysfunction   . Neuromuscular disorder (HCC)    neuropathy hand and legs  . Osteoporosis   . Pneumonia   . 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 Pulmonary edema 12/2019  . Stroke [facility]) 11/2015   TIA  . Weight loss, unintentional      Family History  Problem Relation Age of Onset  . Colon cancer Brother   . Cancer Brother   . Coronary artery disease Mother 70  . Hyperlipidemia Mother   . Hypertension Mother   . Stroke Maternal Aunt   . Esophageal cancer Neg Hx   . Stomach cancer Neg Hx   . Rectal cancer Neg Hx      Social History   Socioeconomic History  . Marital status: Widowed    Spouse name: Not on file  . Number of children: 2  . Years of education: Not on file  . Highest education level: Not on file  Occupational History  . Not on file  Tobacco Use  . Smoking status: Former Smoker    Types: Cigarettes    Quit date: 12/31/1991    Years since quitting: 29.1  . Smokeless tobacco: Never Used  Vaping Use  . Vaping Use: Never used  Substance and Sexual Activity  . Alcohol use: No    Alcohol/week: 0.0 standard drinks  . Drug use: No  . Sexual activity: Not Currently  Other Topics Concern  . Not on file  Social History Narrative   Living with her mother    Right handed   Caffeine: only decaf clears ("no brown sodas" or coffee d/t kidney disease)   Low potassium foods d/t kidney disease   Social Determinants of Health   Financial Resource Strain: Not on file  Food Insecurity: Not on file  Transportation Needs: Not on file  Physical Activity: Not on file  Stress: Not on file  Social Connections: Not on file  Intimate Partner Violence: Not on file     Allergies  Allergen Reactions  . Sulfa Antibiotics Other (See Comments)     Per patient, both parents allergic-so will not take  . Adhesive [Tape] Itching     Outpatient Medications Prior to Visit  Medication Sig Dispense Refill  . acetaminophen (TYLENOL) 500 MG tablet Take 1 tablet (500 mg total) by mouth every 6 (six) hours as needed. 30 tablet 0  . allopurinol (ZYLOPRIM) 300 MG tablet Take 150 mg by mouth daily.     Marland Kitchen ALPRAZolam (XANAX) 0.25 MG tablet Take 0.25 mg by mouth daily as needed for anxiety.     Marland Kitchen aspirin EC 81 MG EC tablet Take 1 tablet (81 mg total) by mouth daily.    Marland Kitchen atorvastatin (LIPITOR) 40 MG tablet Take 1 tablet (40 mg total) by mouth daily. 30 tablet 0  . AURYXIA 1 GM 210 MG(Fe) tablet Take 420 mg by mouth 3 (three) times daily with meals.     . B Complex-C-Folic Acid (DIALYVITE 657) 0.8 MG WAFR Take 1 tablet by mouth daily.   11  . carvedilol (COREG) 6.25 MG tablet Take 6.25 mg by mouth 2 (two) times daily.    . cinacalcet (SENSIPAR) 30 MG tablet Take 30 mg by mouth daily.    Marland Kitchen lactulose (CHRONULAC) 10 GM/15ML solution Take 20 g by mouth daily as needed for mild constipation.    Marland Kitchen losartan (COZAAR) 50 MG tablet Take 50 mg by mouth daily.    . ondansetron (ZOFRAN) 8 MG tablet Take 8 mg by mouth 2 (two) times daily as needed for nausea or vomiting.   0  . zolpidem (AMBIEN) 5 MG tablet Take 1 tablet (5 mg total) by mouth at bedtime as needed for sleep. 20 tablet 0   No facility-administered medications prior to visit.        Objective:   Physical Exam Vitals:   02/13/21 1329  BP: 104/68  Pulse: 77  Temp: 98.7 F (37.1 C)  TempSrc: Temporal  SpO2: 99%  Weight: 115 lb 12.8 oz (52.5 kg)  Height: 5\' 2"  (1.575 m)   Gen: Pleasant, well-nourished, in no distress,  normal affect  ENT: No lesions,  mouth clear,  Poor dentition, oropharynx clear, no postnasal drip  Neck: No JVD, no stridor  Lungs: No use of accessory muscles, no crackles or wheezing on normal respiration, no wheeze on forced expiration  Cardiovascular: RRR,  holosystolic blowing M, no S2.  Right upper extremity fistula, left upper extremity graft. No edema.   Abd: protuberant  Musculoskeletal: No deformities, no cyanosis or clubbing. L costal margin sore to touch  Neuro: alert, awake, non focal  Skin: Warm, no lesions or rash     Assessment & Plan:  Shortness of breath Multifactorial with impact of her  volume status, cardiorenal syndrome.  Also consider tobacco related lung disease, restrictive disease due to abdominal compliance.  She complains of left rib costal margin pain that appears to correlate with a protuberant abdomen.  Appears to be musculoskeletal based on exam.  Question whether she desaturates.  We will work to assess with walking oximetry.  Repeat her chest x-ray, perform pulmonary function testing.  She may benefit from bronchodilator therapy if she has significant COPD.  Chest x-ray today We will arrange for pulmonary function testing in next office visit We will arrange for a 6-minute walk to evaluate for low oxygen levels.  If your oxygen level does drop then we will obtain oxygen for you to use when you exert yourself. Follow with Dr. Lamonte Sakai next available with full pulmonary function testing on the same day  Baltazar Apo, MD, PhD 02/13/2021, 1:52 PM [facility] [phone] or if no answer before 7:00PM call [phone] For any issues after 7:00PM please call eLink [phone]

## 2021-02-13 NOTE — Assessment & Plan Note (Signed)
Multifactorial with impact of her volume status, cardiorenal syndrome.  Also consider tobacco related lung disease, restrictive disease due to abdominal compliance.  She complains of left rib costal margin pain that appears to correlate with a protuberant abdomen.  Appears to be musculoskeletal based on exam.  Question whether she desaturates.  We will work to assess with walking oximetry.  Repeat her chest x-ray, perform pulmonary function testing.  She may benefit from bronchodilator therapy if she has significant COPD.  Chest x-ray today We will arrange for pulmonary function testing in next office visit We will arrange for a 6-minute walk to evaluate for low oxygen levels.  If your oxygen level does drop then we will obtain oxygen for you to use when you exert yourself. Follow with Dr. Lamonte Sakai next available with full pulmonary function testing on the same day

## 2021-02-13 NOTE — Addendum Note (Signed)
Addended by: Gavin Potters R on: 02/13/2021 02:00 PM   Modules accepted: Orders

## 2021-02-13 NOTE — Patient Instructions (Signed)
Chest x-ray today We will arrange for pulmonary function testing in next office visit We will arrange for a 6-minute walk to evaluate for low oxygen levels.  If your oxygen level does drop then we will obtain oxygen for you to use when you exert yourself. Follow with Dr. Lamonte Sakai next available with full pulmonary function testing on the same day.

## 2021-02-14 DIAGNOSIS — N2581 Secondary hyperparathyroidism of renal origin: Secondary | ICD-10-CM | POA: Diagnosis not present

## 2021-02-14 DIAGNOSIS — Z992 Dependence on renal dialysis: Secondary | ICD-10-CM | POA: Diagnosis not present

## 2021-02-14 DIAGNOSIS — N186 End stage renal disease: Secondary | ICD-10-CM | POA: Diagnosis not present

## 2021-02-17 DIAGNOSIS — Z992 Dependence on renal dialysis: Secondary | ICD-10-CM | POA: Diagnosis not present

## 2021-02-17 DIAGNOSIS — N186 End stage renal disease: Secondary | ICD-10-CM | POA: Diagnosis not present

## 2021-02-17 DIAGNOSIS — N2581 Secondary hyperparathyroidism of renal origin: Secondary | ICD-10-CM | POA: Diagnosis not present

## 2021-02-19 DIAGNOSIS — Z992 Dependence on renal dialysis: Secondary | ICD-10-CM | POA: Diagnosis not present

## 2021-02-19 DIAGNOSIS — N186 End stage renal disease: Secondary | ICD-10-CM | POA: Diagnosis not present

## 2021-02-19 DIAGNOSIS — N2581 Secondary hyperparathyroidism of renal origin: Secondary | ICD-10-CM | POA: Diagnosis not present

## 2021-02-21 ENCOUNTER — Emergency Department (HOSPITAL_COMMUNITY): Payer: Medicare HMO

## 2021-02-21 ENCOUNTER — Encounter (HOSPITAL_COMMUNITY): Payer: Self-pay | Admitting: Family Medicine

## 2021-02-21 ENCOUNTER — Inpatient Hospital Stay (HOSPITAL_COMMUNITY)
Admission: EM | Admit: 2021-02-21 | Discharge: 2021-02-25 | DRG: 069 | Disposition: A | Discharge status: Home / Self Care | Payer: Medicare HMO | Attending: Internal Medicine | Admitting: Internal Medicine

## 2021-02-21 DIAGNOSIS — K219 Gastro-esophageal reflux disease without esophagitis: Secondary | ICD-10-CM | POA: Diagnosis present

## 2021-02-21 DIAGNOSIS — I2510 Atherosclerotic heart disease of native coronary artery without angina pectoris: Secondary | ICD-10-CM | POA: Diagnosis not present

## 2021-02-21 DIAGNOSIS — I1311 Hypertensive heart and chronic kidney disease without heart failure, with stage 5 chronic kidney disease, or end stage renal disease: Secondary | ICD-10-CM | POA: Diagnosis not present

## 2021-02-21 DIAGNOSIS — G459 Transient cerebral ischemic attack, unspecified: Secondary | ICD-10-CM | POA: Diagnosis not present

## 2021-02-21 DIAGNOSIS — Z86010 Personal history of colonic polyps: Secondary | ICD-10-CM

## 2021-02-21 DIAGNOSIS — R29810 Facial weakness: Secondary | ICD-10-CM | POA: Diagnosis present

## 2021-02-21 DIAGNOSIS — R4781 Slurred speech: Secondary | ICD-10-CM | POA: Diagnosis not present

## 2021-02-21 DIAGNOSIS — R29818 Other symptoms and signs involving the nervous system: Secondary | ICD-10-CM

## 2021-02-21 DIAGNOSIS — I6622 Occlusion and stenosis of left posterior cerebral artery: Secondary | ICD-10-CM | POA: Diagnosis not present

## 2021-02-21 DIAGNOSIS — N186 End stage renal disease: Secondary | ICD-10-CM | POA: Diagnosis not present

## 2021-02-21 DIAGNOSIS — I10 Essential (primary) hypertension: Secondary | ICD-10-CM | POA: Diagnosis not present

## 2021-02-21 DIAGNOSIS — Z8249 Family history of ischemic heart disease and other diseases of the circulatory system: Secondary | ICD-10-CM

## 2021-02-21 DIAGNOSIS — Z9049 Acquired absence of other specified parts of digestive tract: Secondary | ICD-10-CM

## 2021-02-21 DIAGNOSIS — I132 Hypertensive heart and chronic kidney disease with heart failure and with stage 5 chronic kidney disease, or end stage renal disease: Secondary | ICD-10-CM | POA: Diagnosis present

## 2021-02-21 DIAGNOSIS — Z940 Kidney transplant status: Secondary | ICD-10-CM | POA: Diagnosis not present

## 2021-02-21 DIAGNOSIS — F419 Anxiety disorder, unspecified: Secondary | ICD-10-CM | POA: Diagnosis present

## 2021-02-21 DIAGNOSIS — R9431 Abnormal electrocardiogram [ECG] [EKG]: Secondary | ICD-10-CM

## 2021-02-21 DIAGNOSIS — I5032 Chronic diastolic (congestive) heart failure: Secondary | ICD-10-CM | POA: Diagnosis present

## 2021-02-21 DIAGNOSIS — Z79899 Other long term (current) drug therapy: Secondary | ICD-10-CM

## 2021-02-21 DIAGNOSIS — Z981 Arthrodesis status: Secondary | ICD-10-CM | POA: Diagnosis not present

## 2021-02-21 DIAGNOSIS — R2681 Unsteadiness on feet: Secondary | ICD-10-CM | POA: Diagnosis present

## 2021-02-21 DIAGNOSIS — Z83438 Family history of other disorder of lipoprotein metabolism and other lipidemia: Secondary | ICD-10-CM

## 2021-02-21 DIAGNOSIS — Z8673 Personal history of transient ischemic attack (TIA), and cerebral infarction without residual deficits: Secondary | ICD-10-CM | POA: Diagnosis not present

## 2021-02-21 DIAGNOSIS — R2689 Other abnormalities of gait and mobility: Secondary | ICD-10-CM | POA: Diagnosis present

## 2021-02-21 DIAGNOSIS — Z800 Family history of malignant neoplasm of digestive organs: Secondary | ICD-10-CM

## 2021-02-21 DIAGNOSIS — N2581 Secondary hyperparathyroidism of renal origin: Secondary | ICD-10-CM | POA: Diagnosis present

## 2021-02-21 DIAGNOSIS — Z20822 Contact with and (suspected) exposure to covid-19: Secondary | ICD-10-CM | POA: Diagnosis present

## 2021-02-21 DIAGNOSIS — E8889 Other specified metabolic disorders: Secondary | ICD-10-CM | POA: Diagnosis present

## 2021-02-21 DIAGNOSIS — R29703 NIHSS score 3: Secondary | ICD-10-CM | POA: Diagnosis present

## 2021-02-21 DIAGNOSIS — E785 Hyperlipidemia, unspecified: Secondary | ICD-10-CM | POA: Diagnosis present

## 2021-02-21 DIAGNOSIS — F331 Major depressive disorder, recurrent, moderate: Secondary | ICD-10-CM | POA: Diagnosis not present

## 2021-02-21 DIAGNOSIS — F0390 Unspecified dementia without behavioral disturbance: Secondary | ICD-10-CM | POA: Diagnosis present

## 2021-02-21 DIAGNOSIS — R404 Transient alteration of awareness: Secondary | ICD-10-CM | POA: Diagnosis not present

## 2021-02-21 DIAGNOSIS — Z87891 Personal history of nicotine dependence: Secondary | ICD-10-CM

## 2021-02-21 DIAGNOSIS — R410 Disorientation, unspecified: Secondary | ICD-10-CM | POA: Diagnosis not present

## 2021-02-21 DIAGNOSIS — R531 Weakness: Secondary | ICD-10-CM | POA: Diagnosis not present

## 2021-02-21 DIAGNOSIS — D631 Anemia in chronic kidney disease: Secondary | ICD-10-CM | POA: Diagnosis not present

## 2021-02-21 DIAGNOSIS — E875 Hyperkalemia: Secondary | ICD-10-CM | POA: Diagnosis not present

## 2021-02-21 DIAGNOSIS — Z9841 Cataract extraction status, right eye: Secondary | ICD-10-CM

## 2021-02-21 DIAGNOSIS — Z882 Allergy status to sulfonamides status: Secondary | ICD-10-CM

## 2021-02-21 DIAGNOSIS — I639 Cerebral infarction, unspecified: Secondary | ICD-10-CM

## 2021-02-21 DIAGNOSIS — Z9842 Cataract extraction status, left eye: Secondary | ICD-10-CM

## 2021-02-21 DIAGNOSIS — Z992 Dependence on renal dialysis: Secondary | ICD-10-CM | POA: Diagnosis not present

## 2021-02-21 DIAGNOSIS — G629 Polyneuropathy, unspecified: Secondary | ICD-10-CM | POA: Diagnosis present

## 2021-02-21 DIAGNOSIS — Z7982 Long term (current) use of aspirin: Secondary | ICD-10-CM

## 2021-02-21 DIAGNOSIS — R414 Neurologic neglect syndrome: Secondary | ICD-10-CM | POA: Diagnosis not present

## 2021-02-21 DIAGNOSIS — Z823 Family history of stroke: Secondary | ICD-10-CM

## 2021-02-21 DIAGNOSIS — I6389 Other cerebral infarction: Secondary | ICD-10-CM | POA: Diagnosis not present

## 2021-02-21 DIAGNOSIS — G4489 Other headache syndrome: Secondary | ICD-10-CM | POA: Diagnosis not present

## 2021-02-21 HISTORY — DX: Transient cerebral ischemic attack, unspecified: G45.9

## 2021-02-21 LAB — CBC
HCT: 36.7 % (ref 36.0–46.0)
Hemoglobin: 11.4 g/dL — ABNORMAL LOW (ref 12.0–15.0)
MCH: 30.5 pg (ref 26.0–34.0)
MCHC: 31.1 g/dL (ref 30.0–36.0)
MCV: 98.1 fL (ref 80.0–100.0)
Platelets: 213 10*3/uL (ref 150–400)
RBC: 3.74 MIL/uL — ABNORMAL LOW (ref 3.87–5.11)
RDW: 16 % — ABNORMAL HIGH (ref 11.5–15.5)
WBC: 5.9 10*3/uL (ref 4.0–10.5)
nRBC: 0 % (ref 0.0–0.2)

## 2021-02-21 LAB — I-STAT CHEM 8, ED
BUN: 25 mg/dL — ABNORMAL HIGH (ref 8–23)
Calcium, Ion: 0.99 mmol/L — ABNORMAL LOW (ref 1.15–1.40)
Chloride: 100 mmol/L (ref 98–111)
Creatinine, Ser: 3.8 mg/dL — ABNORMAL HIGH (ref 0.44–1.00)
Glucose, Bld: 100 mg/dL — ABNORMAL HIGH (ref 70–99)
HCT: 36 % (ref 36.0–46.0)
Hemoglobin: 12.2 g/dL (ref 12.0–15.0)
Potassium: 4.1 mmol/L (ref 3.5–5.1)
Sodium: 142 mmol/L (ref 135–145)
TCO2: 35 mmol/L — ABNORMAL HIGH (ref 22–32)

## 2021-02-21 LAB — COMPREHENSIVE METABOLIC PANEL
ALT: 10 U/L (ref 0–44)
AST: 22 U/L (ref 15–41)
Albumin: 3.5 g/dL (ref 3.5–5.0)
Alkaline Phosphatase: 110 U/L (ref 38–126)
Anion gap: 11 (ref 5–15)
BUN: 19 mg/dL (ref 8–23)
CO2: 34 mmol/L — ABNORMAL HIGH (ref 22–32)
Calcium: 9.4 mg/dL (ref 8.9–10.3)
Chloride: 98 mmol/L (ref 98–111)
Creatinine, Ser: 3.73 mg/dL — ABNORMAL HIGH (ref 0.44–1.00)
GFR, Estimated: 12 mL/min — ABNORMAL LOW (ref >60)
Glucose, Bld: 100 mg/dL — ABNORMAL HIGH (ref 70–99)
Potassium: 4.2 mmol/L (ref 3.5–5.1)
Sodium: 143 mmol/L (ref 135–145)
Total Bilirubin: 0.8 mg/dL (ref 0.3–1.2)
Total Protein: 7.3 g/dL (ref 6.5–8.1)

## 2021-02-21 LAB — DIFFERENTIAL
Abs Immature Granulocytes: 0.02 10*3/uL (ref 0.00–0.07)
Basophils Absolute: 0 10*3/uL (ref 0.0–0.1)
Basophils Relative: 1 %
Eosinophils Absolute: 0.2 10*3/uL (ref 0.0–0.5)
Eosinophils Relative: 4 %
Immature Granulocytes: 0 %
Lymphocytes Relative: 25 %
Lymphs Abs: 1.5 10*3/uL (ref 0.7–4.0)
Monocytes Absolute: 0.9 10*3/uL (ref 0.1–1.0)
Monocytes Relative: 15 %
Neutro Abs: 3.3 10*3/uL (ref 1.7–7.7)
Neutrophils Relative %: 55 %

## 2021-02-21 LAB — ETHANOL: Alcohol, Ethyl (B): <10 mg/dL (ref <10)

## 2021-02-21 LAB — RESP PANEL BY RT-PCR (FLU A&B, COVID) ARPGX2
Influenza A by PCR: NEGATIVE
Influenza B by PCR: NEGATIVE
SARS Coronavirus 2 by RT PCR: NEGATIVE

## 2021-02-21 LAB — PROTIME-INR
INR: 1.2 (ref 0.8–1.2)
Prothrombin Time: 14.7 seconds (ref 11.4–15.2)

## 2021-02-21 LAB — APTT: aPTT: 29 seconds (ref 24–36)

## 2021-02-21 LAB — CBG MONITORING, ED: Glucose-Capillary: 84 mg/dL (ref 70–99)

## 2021-02-21 NOTE — ED Provider Notes (Incomplete)
[facility] EMERGENCY DEPARTMENT Provider Note   CSN: [id number] Arrival date & time: 02/21/21  2150     History Chief Complaint  Patient presents with  . Code Stroke    [name] is a 70 y.o. female presenting via EMS as code stroke from home.  EMS, patient completed dialysis today, she returned to home by 6 PM at her baseline.  8  PM she was noted to have sudden onset of confusion, slurred speech, left-sided weakness.    5 caveat secondary to acuity and altered mental status  The history is provided by the EMS personnel. The history is limited by the condition of the patient.       Past Medical History:  Diagnosis Date  . Adenomatous polyp of colon 10/2010, 2006, 2015  . Anemia in CKD (chronic kidney disease) 11/07/2012   s/p blood transfusion.   . Arthritis   . CAD (coronary artery disease)    "something like that"  . CHF (congestive heart failure) ([place])   . Constipation   . Depression with anxiety   . Diverticula, colon   . 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  Had some early rejection then stable function for years, then had slow decline of function and went back on hemodialysis in 2012.  Gets HD TTS schedule at [facility] on [facility] still using L forearm AVF.     Marland Kitchen GERD (gastroesophageal reflux disease)   . GI bleed 2017   felt to be ischemic colitis, last colo 2015  . Headache   . Hyperlipidemia   . Hypertension   . Neurologic gait dysfunction   . Neuromuscular disorder (HCC)    neuropathy hand and legs  . Osteoporosis   . Pneumonia   . 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 Pulmonary edema 12/2019  . Stroke [facility]) 11/2015   TIA  . Weight loss, unintentional     Patient Active Problem List   Diagnosis Date Noted  . NSTEMI (non-ST elevated myocardial infarction) ([place]) 05/14/2020  . Volume overload 05/13/2020   . Hypotension after procedure 03/16/2020  . Chest pain 03/16/2020  . Change in mental state 03/15/2020  . Ventricular tachycardia ([place]) 03/15/2020  . Seizure ([place]) 03/15/2020  . Unresponsive episode 03/15/2020  . Pulmonary edema 01/01/2020  . Complication of vascular access for dialysis 12/21/2019  . Breakdown (mechanical) of surgically created arteriovenous fistula, initial encounter ([place]) 12/18/2019  . Weakness 10/11/2019  . 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 Dependence on renal dialysis ([place]) 07/24/2019  . Iron deficiency anemia, unspecified 06/09/2019  . Age-related osteoporosis without current pathological fracture 04/17/2019  . 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 Coagulation defect, unspecified ([place]) 04/17/2019  . Diarrhea, unspecified 04/17/2019  . Encounter for screening for depression 04/17/2019  . Headache, unspecified 04/17/2019  . 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 Pain, unspecified 04/17/2019  . Polyp of colon 04/17/2019  . Primary generalized (osteo)arthritis 04/17/2019  . Pruritus, unspecified 04/17/2019  . Pure hypercholesterolemia, unspecified 04/17/2019  . Secondary hyperparathyroidism of renal origin ([place]) 04/17/2019  . Gastro-esophageal reflux disease without esophagitis 04/17/2019  . Essential (primary) hypertension 04/17/2019  . Hyperlipidemia, unspecified 04/17/2019  . Transient cerebral ischemic attack, unspecified 04/17/2019  . Hypoxemia 12/13/2018  . Atrial fibrillation ([place]) 03/23/2017  . Prolonged Q-T interval on ECG 02/24/2017  . Hypokalemia 02/24/2017  .  Acute ischemic stroke ([place]) 02/23/2017  . Malnutrition of moderate degree 12/24/2016  . Problem with dialysis access ([place]) 12/21/2016  . Acute ischemic colitis ([place]) 05/16/2016  . Colitis 05/15/2016  . Rectal bleeding 05/15/2016  . Neurologic abnormality 11/19/2015  . Hyperkalemia 11/19/2015  . Anxiety 11/19/2015  . Insomnia 11/19/2015  .  Gait instability   . Stroke-like symptom 11/16/2015  . Vestibular neuritis 11/16/2015  . Stroke (cerebrum) ([place]) 11/16/2015  . Dizziness 05/09/2015  . Ataxia 05/09/2015  . H/O: CVA (cerebrovascular accident) 05/09/2015  . Left facial numbness 05/09/2015  . Left leg numbness 05/09/2015  . Hyperlipidemia   . Acute on chronic diastolic heart failure ([place])   . Shortness of breath 04/01/2013  . HTN (hypertension) 04/01/2013  . Cholecystitis, acute 11/07/2012  . End-stage renal disease on hemodialysis ([place]) 11/07/2012  . Anemia in chronic kidney disease 11/07/2012    Past Surgical History:  Procedure Laterality Date  . AV FISTULA PLACEMENT     for dialysis  . AV FISTULA PLACEMENT Left 11/22/2015   Procedure: ARTERIOVENOUS (AV) FISTULA CREATION-LEFT BRACHIOCEPHALIC;  Surgeon: Serafina Mitchell, MD;  Location: [place];  Service: Vascular;  Laterality: Left;  . AV FISTULA PLACEMENT Right 03/15/2020   Procedure: INSERTION OF ARTERIOVENOUS (AV) GORE-TEX GRAFT ARM ( BRACHIAL AXILLARY );  Surgeon: Katha Cabal, MD;  Location: [facility];  Service: Vascular;  Laterality: Right;  . BACK SURGERY    . CERVICAL FUSION    . CHOLECYSTECTOMY  12/02/2012   Procedure: LAPAROSCOPIC CHOLECYSTECTOMY WITH INTRAOPERATIVE CHOLANGIOGRAM;  Surgeon: Edward Jolly, MD;  Location: [place];  Service: General;  Laterality: N/A;  . EYE SURGERY Bilateral    cataract surgery  . EYE SURGERY Left 2019   laser  . HEMATOMA EVACUATION Left 12/24/2016   Procedure: EVACUATION HEMATOMA LEFT UPPER ARM;  Surgeon: Waynetta Sandy, MD;  Location: [place];  Service: Vascular;  Laterality: Left;  . I & D EXTREMITY Left 12/31/2016   Procedure: IRRIGATION AND DEBRIDEMENT EXTREMITY;  Surgeon: Angelia Mould, MD;  Location: [place];  Service: Vascular;  Laterality: Left;  . INSERTION OF DIALYSIS CATHETER Right 12/24/2016   Procedure: INSERTION OF DIALYSIS CATHETER;  Surgeon: Waynetta Sandy, MD;  [place];   Service: Vascular;  Laterality: Right;  . INSERTION OF DIALYSIS CATHETER Right 02/04/2017   Procedure: INSERTION OF DIALYSIS CATHETER;  Surgeon: Waynetta Sandy, MD;  Location: [place];  Service: Vascular;  Laterality: Right;  . KIDNEY TRANSPLANT  1996  . PERIPHERAL VASCULAR CATHETERIZATION Left 10/23/2016   Procedure: Fistulagram;  Surgeon: Elam Dutch, MD;  Location: [place] CV LAB;  Service: Cardiovascular;  Laterality: Left;  . PERIPHERAL VASCULAR THROMBECTOMY Right 04/16/2020   Procedure: PERIPHERAL VASCULAR THROMBECTOMY;  Surgeon: Katha Cabal, MD;  Location: [place] CV LAB;  Service: Cardiovascular;  Laterality: Right;  . RESECTION OF ARTERIOVENOUS FISTULA ANEURYSM Left 11/22/2015   Procedure: RESECTION OF LEFT RADIOCEPHALIC FISTULA ANEURYSM ;  Surgeon: Serafina Mitchell, MD;  Location: [place];  Service: Vascular;  Laterality: Left;  . REVISON OF ARTERIOVENOUS FISTULA Left 12/22/2016   Procedure: REVISON OF LEFT ARTERIOVENOUS FISTULA;  Surgeon: Waynetta Sandy, MD;  Location: [place];  Service: Vascular;  Laterality: Left;  . REVISON OF ARTERIOVENOUS FISTULA Left 02/04/2017   Procedure: REVISON OF LEFT UPPER ARM ARTERIOVENOUS FISTULA;  Surgeon: Waynetta Sandy, MD;  Location: [place];  Service: Vascular;  Laterality: Left;  . UPPER EXTREMITY ANGIOGRAPHY Bilateral 09/19/2019   Procedure: UPPER  EXTREMITY ANGIOGRAPHY;  Surgeon: Katha Cabal, MD;  Location: [place] CV LAB;  Service: Cardiovascular;  Laterality: Bilateral;     OB History   No obstetric history on file.     Family History  Problem Relation Age of Onset  . Colon cancer Brother   . Cancer Brother   . Coronary artery disease Mother 17  . Hyperlipidemia Mother   . Hypertension Mother   . Stroke Maternal Aunt   . Esophageal cancer Neg Hx   . Stomach cancer Neg Hx   . Rectal cancer Neg Hx     Social History   Tobacco Use  . Smoking status: Former Smoker    Types:  Cigarettes    Quit date: 12/31/1991    Years since quitting: 29.1  . Smokeless tobacco: Never Used  Vaping Use  . Vaping Use: Never used  Substance Use Topics  . Alcohol use: No    Alcohol/week: 0.0 standard drinks  . Drug use: No    Home Medications Prior to Admission medications   Medication Sig Start Date End Date Taking? Authorizing Provider  acetaminophen (TYLENOL) 500 MG tablet Take 1 tablet (500 mg total) by mouth every 6 (six) hours as needed. 09/05/18   Law, Bea Graff, PA-C  allopurinol (ZYLOPRIM) 300 MG tablet Take 150 mg by mouth daily.     [provider]  ALPRAZolam Duanne Moron) 0.25 MG tablet Take 0.25 mg by mouth daily as needed for anxiety.     [provider]  aspirin EC 81 MG EC tablet Take 1 tablet (81 mg total) by mouth daily. 05/14/20   Black, Lezlie Octave, NP  atorvastatin (LIPITOR) 40 MG tablet Take 1 tablet (40 mg total) by mouth daily. 04/02/20 07/05/29  Amin, Ankit Chirag, MD  AURYXIA 1 GM 210 MG(Fe) tablet Take 420 mg by mouth 3 (three) times daily with meals.     [provider]  B Complex-C-Folic Acid (DIALYVITE 846) 0.8 MG WAFR Take 1 tablet by mouth daily.  10/25/18   [provider]  carvedilol (COREG) 6.25 MG tablet Take 6.25 mg by mouth 2 (two) times daily. 08/28/20   [provider]  cinacalcet (SENSIPAR) 30 MG tablet Take 30 mg by mouth daily. 01/04/20   [provider]  lactulose (CHRONULAC) 10 GM/15ML solution Take 20 g by mouth daily as needed for mild constipation.    [provider]  losartan (COZAAR) 50 MG tablet Take 50 mg by mouth daily. 09/25/20   [provider]  ondansetron (ZOFRAN) 8 MG tablet Take 8 mg by mouth 2 (two) times daily as needed for nausea or vomiting.     [provider]  zolpidem (AMBIEN) 5 MG tablet Take 1 tablet (5 mg total) by mouth at bedtime as needed for sleep. 07/27/20   Donne Hazel, MD    Allergies    Sulfa antibiotics and Adhesive [tape]  Review  of Systems   Review of Systems  Unable to perform ROS: Mental status change    Physical Exam Updated Vital Signs BP (!) 147/83   Pulse 69   Temp 97.6 F (36.4 C) (Oral)   Resp 19   SpO2 98%   Physical Exam Vitals and nursing note reviewed.  Constitutional:      Appearance: She is well-developed.     Comments: Chronically ill-appearing  HENT:     Head: Normocephalic and atraumatic.  Eyes:     Conjunctiva/sclera: Conjunctivae normal.  Cardiovascular:     Rate  and Rhythm: Normal rate and regular rhythm.  Pulmonary:     Effort: Pulmonary effort is normal.     Breath sounds: Normal breath sounds.  Abdominal:     Palpations: Abdomen is soft.  Musculoskeletal:     Right lower leg: Edema (Trace) present.     Left lower leg: Edema (Trace) present.  Skin:    General: Skin is warm.  Neurological:     Mental Status: She is alert.     Comments: Mental Status:  Alert, oriented, thought content appropriate, able to give a coherent history. Speech fluent without evidence of aphasia. Able to follow 2 step commands without difficulty.  Cranial Nerves: PERRL, normal sensation to face, no Motor:  Normal tone. 4/5 strength in left upper, 5/5 right upper, equal lower extremities bilaterally Sensory: grossly normal in all extremities.  Cerebellar: normal finger-to-nose with bilateral upper extremities Gait:deferred CV: distal pulses palpable throughout    Psychiatric:        Behavior: Behavior normal.     ED Results / Procedures / Treatments   Labs (all labs ordered are listed, but only abnormal results are displayed) Labs Reviewed  CBC - Abnormal; Notable for the following components:      Result Value   RBC 3.74 (*)    Hemoglobin 11.4 (*)    RDW 16.0 (*)    All other components within normal limits  COMPREHENSIVE METABOLIC PANEL - Abnormal; Notable for the following components:   CO2 34 (*)    Glucose, Bld 100 (*)    Creatinine, Ser 3.73 (*)    GFR, Estimated 12 (*)     All other components within normal limits  I-STAT CHEM 8, ED - Abnormal; Notable for the following components:   BUN 25 (*)    Creatinine, Ser 3.80 (*)    Glucose, Bld 100 (*)    Calcium, Ion 0.99 (*)    TCO2 35 (*)    All other components within normal limits  RESP PANEL BY RT-PCR (FLU A&B, COVID) ARPGX2  ETHANOL  PROTIME-INR  APTT  DIFFERENTIAL  RAPID URINE DRUG SCREEN, HOSP PERFORMED  URINALYSIS, ROUTINE W REFLEX MICROSCOPIC  CBG MONITORING, ED    EKG None  Radiology CT HEAD CODE STROKE WO CONTRAST  Result Date: 02/21/2021 CLINICAL DATA:  Code stroke.  Slurred speech EXAM: CT HEAD WITHOUT CONTRAST TECHNIQUE: Contiguous axial images were obtained from the base of the skull through the vertex without intravenous contrast. COMPARISON:  Head CT 05/13/2018 FINDINGS: Brain: There is no mass, hemorrhage or extra-axial collection. There is generalized atrophy without lobar predilection. There is hypoattenuation of the periventricular white matter, most commonly indicating chronic ischemic microangiopathy. Vascular: Atherosclerotic calcification of the vertebral and internal carotid arteries at the skull base. No abnormal hyperdensity of the major intracranial arteries or dural venous sinuses. Skull: The visualized skull base, calvarium and extracranial soft tissues are normal. Upper cervical spinal canal appears narrowed. Sinuses/Orbits: No fluid levels or advanced mucosal thickening of the visualized paranasal sinuses. No mastoid or middle ear effusion. The orbits are normal. ASPECTS [facility] Stroke Program Early CT Score) - Ganglionic level infarction (caudate, lentiform nuclei, internal capsule, insula, M1-M3 cortex): 7 - Supraganglionic infarction (M4-M6 cortex): 3 Total score (0-10 with 10 being normal): 10 IMPRESSION: 1. No acute intracranial abnormality. 2. ASPECTS is 10. 3. 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 These results were communicated to Dr. Kerney Elbe at 10:12 pm on  02/21/2021 by text page via the [facility] messaging system. Electronically Signed  By: Ulyses Jarred M.D.   On: 02/21/2021 22:12    Procedures Procedures {Remember to document critical care time when appropriate:1}  Medications Ordered in ED Medications - No data to display  ED Course  I have reviewed the triage vital signs and the nursing notes.  Pertinent labs & imaging results that were available during my care of the patient were reviewed by me and considered in my medical decision making (see chart for details).    MDM Rules/Calculators/A&P                          *** Patient states she has headache which has been occurring with her dialysis.  She treated with Tylenol last night.  She states she has been frequently getting headaches at dialysis and they continue when she arrives home.  She was having similar headache today with some nausea.  Denies any vision changes.  She also endorses some numbness to the right arm however on further discussion she states she always has some numbness sensation in her right arm when they access her fistula for hemodialysis.  She is not having any numbness currently.  Denies any weakness anywhere.   Final Clinical Impression(s) / ED Diagnoses Final diagnoses:  None    Rx / DC Orders ED Discharge Orders    None

## 2021-02-21 NOTE — ED Triage Notes (Signed)
Pt arrived via ems as a code stroke. Pt is from home and was last know well at 8:00 pm. Pt received dialysis today at 6:00 pm it is unknown if pt received full treatment. It is reported that pt has slurred speech, confusion, right sided weakness. Pt has a hx of stroke and hx of a brain bleed. Pt is orientated to person, time,place. Pt is disorientated to situation.

## 2021-02-21 NOTE — ED Provider Notes (Signed)
[facility] EMERGENCY DEPARTMENT Provider Note   CSN: [id number] Arrival date & time: 02/21/21  2150     History Chief Complaint  Patient presents with  . Code Stroke    [name] is a 70 y.o. female presenting via EMS as code stroke from home.  EMS, patient completed dialysis today, she returned to home by 6 PM at her baseline.  8  PM she was noted to have sudden onset of confusion, slurred speech, left-sided weakness.    5 caveat secondary to acuity and altered mental status  The history is provided by the EMS personnel. The history is limited by the condition of the patient.       Past Medical History:  Diagnosis Date  . Adenomatous polyp of colon 10/2010, 2006, 2015  . Anemia in CKD (chronic kidney disease) 11/07/2012   s/p blood transfusion.   . Arthritis   . CAD (coronary artery disease)    "something like that"  . CHF (congestive heart failure) ([place])   . Constipation   . Depression with anxiety   . Diverticula, colon   . 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  Had some early rejection then stable function for years, then had slow decline of function and went back on hemodialysis in 2012.  Gets HD TTS schedule at [facility] on [facility] still using L forearm AVF.     Marland Kitchen GERD (gastroesophageal reflux disease)   . GI bleed 2017   felt to be ischemic colitis, last colo 2015  . Headache   . Hyperlipidemia   . Hypertension   . Neurologic gait dysfunction   . Neuromuscular disorder (HCC)    neuropathy hand and legs  . Osteoporosis   . Pneumonia   . 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 Pulmonary edema 12/2019  . Stroke [facility]) 11/2015   TIA  . Weight loss, unintentional     Patient Active Problem List   Diagnosis Date Noted  . CVA (cerebral vascular accident) ([place]) 02/21/2021  . Prolonged QT interval 02/21/2021  .  NSTEMI (non-ST elevated myocardial infarction) ([place]) 05/14/2020  . Volume overload 05/13/2020  . Hypotension after procedure 03/16/2020  . Chest pain 03/16/2020  . Change in mental state 03/15/2020  . Ventricular tachycardia ([place]) 03/15/2020  . Seizure ([place]) 03/15/2020  . Unresponsive episode 03/15/2020  . Pulmonary edema 01/01/2020  . Complication of vascular access for dialysis 12/21/2019  . Breakdown (mechanical) of surgically created arteriovenous fistula, initial encounter ([place]) 12/18/2019  . Weakness 10/11/2019  . 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 Dependence on renal dialysis ([place]) 07/24/2019  . Iron deficiency anemia, unspecified 06/09/2019  . Age-related osteoporosis without current pathological fracture 04/17/2019  . 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 Coagulation defect, unspecified ([place]) 04/17/2019  . Diarrhea, unspecified 04/17/2019  . Encounter for screening for depression 04/17/2019  . Headache, unspecified 04/17/2019  . 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 Pain, unspecified 04/17/2019  . Polyp of colon 04/17/2019  . Primary generalized (osteo)arthritis 04/17/2019  . Pruritus, unspecified 04/17/2019  . Pure hypercholesterolemia, unspecified 04/17/2019  . Secondary hyperparathyroidism of renal origin ([place]) 04/17/2019  . Gastro-esophageal reflux disease without esophagitis 04/17/2019  . Essential (primary) hypertension 04/17/2019  . Hyperlipidemia, unspecified 04/17/2019  . Transient cerebral ischemic attack, unspecified 04/17/2019  . Hypoxemia 12/13/2018  . Atrial fibrillation (  [place]) 03/23/2017  . Prolonged Q-T interval on ECG 02/24/2017  . Hypokalemia 02/24/2017  . Acute ischemic stroke ([place]) 02/23/2017  . Malnutrition of moderate degree 12/24/2016  . Problem with dialysis access ([place]) 12/21/2016  . Acute ischemic colitis ([place]) 05/16/2016  . Colitis 05/15/2016  . Rectal bleeding 05/15/2016  . Neurologic  abnormality 11/19/2015  . Hyperkalemia 11/19/2015  . Anxiety 11/19/2015  . Insomnia 11/19/2015  . Gait instability   . Stroke-like symptom 11/16/2015  . Vestibular neuritis 11/16/2015  . Stroke (cerebrum) ([place]) 11/16/2015  . Dizziness 05/09/2015  . Ataxia 05/09/2015  . H/O: CVA (cerebrovascular accident) 05/09/2015  . Left facial numbness 05/09/2015  . Left leg numbness 05/09/2015  . Hyperlipidemia   . Acute on chronic diastolic heart failure ([place])   . Shortness of breath 04/01/2013  . HTN (hypertension) 04/01/2013  . Cholecystitis, acute 11/07/2012  . End-stage renal disease on hemodialysis ([place]) 11/07/2012  . Anemia in chronic kidney disease 11/07/2012    Past Surgical History:  Procedure Laterality Date  . AV FISTULA PLACEMENT     for dialysis  . AV FISTULA PLACEMENT Left 11/22/2015   Procedure: ARTERIOVENOUS (AV) FISTULA CREATION-LEFT BRACHIOCEPHALIC;  Surgeon: Serafina Mitchell, MD;  Location: [place];  Service: Vascular;  Laterality: Left;  . AV FISTULA PLACEMENT Right 03/15/2020   Procedure: INSERTION OF ARTERIOVENOUS (AV) GORE-TEX GRAFT ARM ( BRACHIAL AXILLARY );  Surgeon: Katha Cabal, MD;  Location: [facility];  Service: Vascular;  Laterality: Right;  . BACK SURGERY    . CERVICAL FUSION    . CHOLECYSTECTOMY  12/02/2012   Procedure: LAPAROSCOPIC CHOLECYSTECTOMY WITH INTRAOPERATIVE CHOLANGIOGRAM;  Surgeon: Edward Jolly, MD;  Location: [place];  Service: General;  Laterality: N/A;  . EYE SURGERY Bilateral    cataract surgery  . EYE SURGERY Left 2019   laser  . HEMATOMA EVACUATION Left 12/24/2016   Procedure: EVACUATION HEMATOMA LEFT UPPER ARM;  Surgeon: Waynetta Sandy, MD;  Location: [place];  Service: Vascular;  Laterality: Left;  . I & D EXTREMITY Left 12/31/2016   Procedure: IRRIGATION AND DEBRIDEMENT EXTREMITY;  Surgeon: Angelia Mould, MD;  Location: [place];  Service: Vascular;  Laterality: Left;  . INSERTION OF DIALYSIS CATHETER Right 12/24/2016    Procedure: INSERTION OF DIALYSIS CATHETER;  Surgeon: Waynetta Sandy, MD;  Location: [place];  Service: Vascular;  Laterality: Right;  . INSERTION OF DIALYSIS CATHETER Right 02/04/2017   Procedure: INSERTION OF DIALYSIS CATHETER;  Surgeon: Waynetta Sandy, MD;  Location: [place];  Service: Vascular;  Laterality: Right;  . KIDNEY TRANSPLANT  1996  . PERIPHERAL VASCULAR CATHETERIZATION Left 10/23/2016   Procedure: Fistulagram;  Surgeon: Elam Dutch, MD;  Location: [place] [facility];  Service: Cardiovascular;  Laterality: Left;  . PERIPHERAL VASCULAR THROMBECTOMY Right 04/16/2020   Procedure: PERIPHERAL VASCULAR THROMBECTOMY;  Surgeon: Katha Cabal, MD;  Location: [place] [facility];  Service: Cardiovascular;  Laterality: Right;  . RESECTION OF ARTERIOVENOUS FISTULA ANEURYSM Left 11/22/2015   Procedure: RESECTION OF LEFT RADIOCEPHALIC FISTULA ANEURYSM ;  Surgeon: Serafina Mitchell, MD;  Location: [place];  Service: Vascular;  Laterality: Left;  . REVISON OF ARTERIOVENOUS FISTULA Left 12/22/2016   Procedure: REVISON OF LEFT ARTERIOVENOUS FISTULA;  Surgeon: Waynetta Sandy, MD;  Location: [place];  Service: Vascular;  Laterality: Left;  . REVISON OF ARTERIOVENOUS FISTULA Left 02/04/2017   Procedure: REVISON OF LEFT UPPER ARM ARTERIOVENOUS FISTULA;  Surgeon: Waynetta Sandy, MD;  Location: [place];  Service: Vascular;  Laterality: Left;  . UPPER EXTREMITY ANGIOGRAPHY Bilateral 09/19/2019   Procedure: UPPER EXTREMITY ANGIOGRAPHY;  Surgeon: Katha Cabal, MD;  Location: [place] [facility];  Service: Cardiovascular;  Laterality: Bilateral;     OB History   No obstetric history on file.     Family History  Problem Relation Age of Onset  . Colon cancer Brother   . Cancer Brother   . Coronary artery disease Mother 69  . Hyperlipidemia Mother   . Hypertension Mother   . Stroke Maternal Aunt   . Esophageal cancer Neg Hx   . Stomach cancer Neg Hx   . Rectal  cancer Neg Hx     Social History   Tobacco Use  . Smoking status: Former Smoker    Types: Cigarettes    Quit date: 12/31/1991    Years since quitting: 29.1  . Smokeless tobacco: Never Used  Vaping Use  . Vaping Use: Never used  Substance Use Topics  . Alcohol use: No    Alcohol/week: 0.0 standard drinks  . Drug use: No    Home Medications Prior to Admission medications   Medication Sig Start Date End Date Taking? Authorizing Provider  acetaminophen (TYLENOL) 500 MG tablet Take 1 tablet (500 mg total) by mouth every 6 (six) hours as needed. 09/05/18   Law, Bea Graff, PA-C  allopurinol (ZYLOPRIM) 300 MG tablet Take 150 mg by mouth daily.     [provider]  ALPRAZolam Duanne Moron) 0.25 MG tablet Take 0.25 mg by mouth daily as needed for anxiety.     [provider]  aspirin EC 81 MG EC tablet Take 1 tablet (81 mg total) by mouth daily. 05/14/20   Black, Lezlie Octave, NP  atorvastatin (LIPITOR) 40 MG tablet Take 1 tablet (40 mg total) by mouth daily. 04/02/20 07/05/29  Amin, Ankit Chirag, MD  AURYXIA 1 GM 210 MG(Fe) tablet Take 420 mg by mouth 3 (three) times daily with meals.     [provider]  B Complex-C-Folic Acid (DIALYVITE 536) 0.8 MG WAFR Take 1 tablet by mouth daily.  10/25/18   [provider]  carvedilol (COREG) 6.25 MG tablet Take 6.25 mg by mouth 2 (two) times daily. 08/28/20   [provider]  cinacalcet (SENSIPAR) 30 MG tablet Take 30 mg by mouth daily. 01/04/20   [provider]  lactulose (CHRONULAC) 10 GM/15ML solution Take 20 g by mouth daily as needed for mild constipation.    [provider]  losartan (COZAAR) 50 MG tablet Take 50 mg by mouth daily. 09/25/20   [provider]  ondansetron (ZOFRAN) 8 MG tablet Take 8 mg by mouth 2 (two) times daily as needed for nausea or vomiting.     [provider]  zolpidem (AMBIEN) 5 MG tablet Take 1 tablet (5 mg total) by mouth at bedtime as needed for sleep.  07/27/20   Donne Hazel, MD    Allergies    Sulfa antibiotics and Adhesive [tape]  Review of Systems   Review of Systems  Unable to perform ROS: Mental status change    Physical Exam Updated Vital Signs BP (!) 147/83   Pulse 69   Temp 97.6 F (36.4 C) (Oral)   Resp 19   SpO2 98%   Physical Exam Vitals and nursing note reviewed.  Constitutional:      Appearance: She is well-developed.     Comments: Chronically ill-appearing  HENT:     Head: Normocephalic and atraumatic.  Eyes:     Conjunctiva/sclera: Conjunctivae normal.  Cardiovascular:     Rate and Rhythm: Normal rate and regular rhythm.  Pulmonary:     Effort: Pulmonary effort is normal.     Breath sounds: Normal breath sounds.  Abdominal:     Palpations: Abdomen is soft.  Musculoskeletal:     Right lower leg: Edema (Trace) present.     Left lower leg: Edema (Trace) present.  Skin:    General: Skin is warm.  Neurological:     Mental Status: She is alert.     Comments: Mental Status:  Alert, oriented, thought content appropriate, able to give a coherent history. Speech fluent without evidence of aphasia. Able to follow 2 step commands without difficulty.  Cranial Nerves: PERRL, normal sensation to face, possible left droop? Motor:  Normal tone. 4/5 strength in left upper, 5/5 right upper, equal lower extremities bilaterally Sensory: grossly normal in all extremities.  Cerebellar: normal finger-to-nose with bilateral upper extremities Gait:deferred CV: distal pulses palpable throughout    Psychiatric:        Behavior: Behavior normal.     ED Results / Procedures / Treatments   Labs (all labs ordered are listed, but only abnormal results are displayed) Labs Reviewed  CBC - Abnormal; Notable for the following components:      Result Value   RBC 3.74 (*)    Hemoglobin 11.4 (*)    RDW 16.0 (*)    All other components within normal limits  COMPREHENSIVE METABOLIC PANEL - Abnormal; Notable for the  following components:   CO2 34 (*)    Glucose, Bld 100 (*)    Creatinine, Ser 3.73 (*)    GFR, Estimated 12 (*)    All other components within normal limits  I-STAT CHEM 8, ED - Abnormal; Notable for the following components:   BUN 25 (*)    Creatinine, Ser 3.80 (*)    Glucose, Bld 100 (*)    Calcium, Ion 0.99 (*)    TCO2 35 (*)    All other components within normal limits  RESP PANEL BY RT-PCR (FLU A&B, COVID) ARPGX2  ETHANOL  PROTIME-INR  APTT  DIFFERENTIAL  RAPID URINE DRUG SCREEN, HOSP PERFORMED  URINALYSIS, ROUTINE W REFLEX MICROSCOPIC  CBG MONITORING, ED    EKG None  Radiology CT HEAD CODE STROKE WO CONTRAST  Result Date: 02/21/2021 CLINICAL DATA:  Code stroke.  Slurred speech EXAM: CT HEAD WITHOUT CONTRAST TECHNIQUE: Contiguous axial images were obtained from the base of the skull through the vertex without intravenous contrast. COMPARISON:  Head CT 05/13/2018 FINDINGS: Brain: There is no mass, hemorrhage or extra-axial collection. There is generalized atrophy without lobar predilection. There is hypoattenuation of the periventricular white matter, most commonly indicating chronic ischemic microangiopathy. Vascular: Atherosclerotic calcification of the vertebral and internal carotid arteries at the skull base. No abnormal hyperdensity of the major intracranial arteries or dural venous sinuses. Skull: The visualized skull base, calvarium and extracranial soft tissues are normal. Upper cervical spinal canal appears narrowed. Sinuses/Orbits: No fluid levels or advanced mucosal thickening of the visualized paranasal sinuses. No mastoid or middle ear effusion. The orbits are normal. ASPECTS [facility] Stroke Program Early CT Score) - Ganglionic level infarction (caudate, lentiform nuclei, internal capsule, insula, M1-M3 cortex): 7 - Supraganglionic infarction (M4-M6 cortex): 3 Total score (0-10 with 10 being normal): 10 IMPRESSION: 1. No acute intracranial abnormality. 2. ASPECTS is 10.  3. 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 These results were communicated to Dr. Randall Hiss  [place]zen at 10:12 pm on 02/21/2021 by text page via the [facility] messaging system. Electronically Signed   By: Ulyses Jarred M.D.   On: 02/21/2021 22:12    Procedures Procedures   Medications Ordered in ED Medications - No data to display  ED Course  I have reviewed the triage vital signs and the nursing notes.  Pertinent labs & imaging results that were available during my care of the patient were reviewed by me and considered in my medical decision making (see chart for details).    MDM Rules/Calculators/A&P                          Patient presenting via EMS as code stroke, symptoms began suddenly at 8 PM.  She fully completed her dialysis treatment today.  Came home with a headache which is not unusual for her.  Initially, patient appeared drowsy and confused though was able to answer most orientation questions.  CT scan was obtained and is negative.  Neurology, Dr. Cheral Marker, recommends admission for stroke work-up and MRI.  On reevaluation, patient seems much more alert and is answering questions appropriately. Patient states she has headache which has been occurring with her dialysis.  She treated with Tylenol last night.  She states she has been frequently getting headaches at dialysis and they continue when she arrives home.  She was having similar headache today with some nausea.  Denies any vision changes.  She also endorses some numbness to the right arm however on further discussion she states she always has some numbness sensation in her right arm when they access her fistula for hemodialysis.  She is not having any numbness currently.  Denies any weakness anywhere.  Patient will be admitted for stroke work-up. Final Clinical Impression(s) / ED Diagnoses Final diagnoses:  Acute focal neurological deficit    Rx / DC Orders ED Discharge Orders    None       Manford Sprong, [place] N,  PA-C 02/22/21 0005    Drenda Freeze, MD 02/24/21 1626

## 2021-02-21 NOTE — Consult Note (Signed)
Referring Physician: Dr. Darl Householder    Chief Complaint: Acute onset of left facial droop, confusion and slurred speech  HPI: [name] is an 70 y.o. female with multiple medical conditions including ESRD on HD presenting via EMS from home as a Code Stroke with acute onset of left facial droop, confusion and slurred speech. LKN was 8:00 PM. She received dialysis today and arrived back home at 6:00 PM without any deficits at that time. 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 She has an MRI from 2016 which documents chronic cerebral microhemorrhages.   Her PMHx includes several stroke risk factors: CAD, CHF, HLD, HTN, previous TIA and multiple old lacunes on prior imaging.  LSN: 8:00 PM tPA Given: No: Resolving/minimal symptoms and history of intracerebral hemorrhage.   Past Medical History:  Diagnosis Date  . Adenomatous polyp of colon 10/2010, 2006, 2015  . Anemia in CKD (chronic kidney disease) 11/07/2012   s/p blood transfusion.   . Arthritis   . CAD (coronary artery disease)    "something like that"  . CHF (congestive heart failure) ([place])   . Constipation   . Depression with anxiety   . Diverticula, colon   . 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  Had some early rejection then stable function for years, then had slow decline of function and went back on hemodialysis in 2012.  Gets HD TTS schedule at [facility] on [facility] still using L forearm AVF.     Marland Kitchen GERD (gastroesophageal reflux disease)   . GI bleed 2017   felt to be ischemic colitis, last colo 2015  . Headache   . Hyperlipidemia   . Hypertension   . Neurologic gait dysfunction   . Neuromuscular disorder (HCC)    neuropathy hand and legs  . Osteoporosis   . Pneumonia   . 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  Pulmonary edema 12/2019  . Stroke [facility]) 11/2015   TIA  . Weight loss, unintentional     Past Surgical History:  Procedure Laterality Date  . AV FISTULA PLACEMENT     for dialysis  . AV FISTULA PLACEMENT Left 11/22/2015   Procedure: ARTERIOVENOUS (AV) FISTULA CREATION-LEFT BRACHIOCEPHALIC;  Surgeon: Serafina Mitchell, MD;  Location: [place];  Service: Vascular;  Laterality: Left;  . AV FISTULA PLACEMENT Right 03/15/2020   Procedure: INSERTION OF ARTERIOVENOUS (AV) GORE-TEX GRAFT ARM ( BRACHIAL AXILLARY );  Surgeon: Katha Cabal, MD;  Location: [facility];  Service: Vascular;  Laterality: Right;  . BACK SURGERY    . CERVICAL FUSION    . CHOLECYSTECTOMY  12/02/2012   Procedure: LAPAROSCOPIC CHOLECYSTECTOMY WITH INTRAOPERATIVE CHOLANGIOGRAM;  Surgeon: Edward Jolly, MD;  Location: [place];  Service: General;  Laterality: N/A;  . EYE SURGERY Bilateral    cataract surgery  . EYE SURGERY Left 2019   laser  . HEMATOMA EVACUATION Left 12/24/2016   Procedure: EVACUATION HEMATOMA LEFT UPPER ARM;  Surgeon: Waynetta Sandy, MD;  Location: [place];  Service: Vascular;  Laterality: Left;  . I & D EXTREMITY Left 12/31/2016   Procedure: IRRIGATION AND DEBRIDEMENT EXTREMITY;  Surgeon: Angelia Mould, MD;  Location: [place];  Service: Vascular;  Laterality: Left;  . INSERTION OF DIALYSIS CATHETER Right 12/24/2016  Procedure: INSERTION OF DIALYSIS CATHETER;  Surgeon: Waynetta Sandy, MD;  Location: [place];  Service: Vascular;  Laterality: Right;  . INSERTION OF DIALYSIS CATHETER Right 02/04/2017   Procedure: INSERTION OF DIALYSIS CATHETER;  Surgeon: Waynetta Sandy, MD;  Location: [place];  Service: Vascular;  Laterality: Right;  . KIDNEY TRANSPLANT  1996  . PERIPHERAL VASCULAR CATHETERIZATION Left 10/23/2016   Procedure: Fistulagram;  Surgeon: Elam Dutch, MD;  Location: [place] [facility];  Service: Cardiovascular;  Laterality: Left;  . PERIPHERAL VASCULAR THROMBECTOMY  Right 04/16/2020   Procedure: PERIPHERAL VASCULAR THROMBECTOMY;  Surgeon: Katha Cabal, MD;  Location: [place] [facility];  Service: Cardiovascular;  Laterality: Right;  . RESECTION OF ARTERIOVENOUS FISTULA ANEURYSM Left 11/22/2015   Procedure: RESECTION OF LEFT RADIOCEPHALIC FISTULA ANEURYSM ;  Surgeon: Serafina Mitchell, MD;  Location: [place];  Service: Vascular;  Laterality: Left;  . REVISON OF ARTERIOVENOUS FISTULA Left 12/22/2016   Procedure: REVISON OF LEFT ARTERIOVENOUS FISTULA;  Surgeon: Waynetta Sandy, MD;  Location: [place];  Service: Vascular;  Laterality: Left;  . REVISON OF ARTERIOVENOUS FISTULA Left 02/04/2017   Procedure: REVISON OF LEFT UPPER ARM ARTERIOVENOUS FISTULA;  Surgeon: Waynetta Sandy, MD;  Location: [place];  Service: Vascular;  Laterality: Left;  . UPPER EXTREMITY ANGIOGRAPHY Bilateral 09/19/2019   Procedure: UPPER EXTREMITY ANGIOGRAPHY;  Surgeon: Katha Cabal, MD;  Location: [place] [facility];  Service: Cardiovascular;  Laterality: Bilateral;    Family History  Problem Relation Age of Onset  . Colon cancer Brother   . Cancer Brother   . Coronary artery disease Mother 39  . Hyperlipidemia Mother   . Hypertension Mother   . Stroke Maternal Aunt   . Esophageal cancer Neg Hx   . Stomach cancer Neg Hx   . Rectal cancer Neg Hx    Social History:  reports that she quit smoking about 29 years ago. Her smoking use included cigarettes. She has never used smokeless tobacco. She reports that she does not drink alcohol and does not use drugs.  Allergies:  Allergies  Allergen Reactions  . Sulfa Antibiotics Other (See Comments)    Per patient, both parents allergic-so will not take  . Adhesive [Tape] Itching    Home Medications:  No current facility-administered medications on file prior to encounter.   Current Outpatient Medications on File Prior to Encounter  Medication Sig Dispense Refill  . acetaminophen (TYLENOL) 500 MG tablet Take 1  tablet (500 mg total) by mouth every 6 (six) hours as needed. 30 tablet 0  . allopurinol (ZYLOPRIM) 300 MG tablet Take 150 mg by mouth daily.     Marland Kitchen ALPRAZolam (XANAX) 0.25 MG tablet Take 0.25 mg by mouth daily as needed for anxiety.     Marland Kitchen aspirin EC 81 MG EC tablet Take 1 tablet (81 mg total) by mouth daily.    Marland Kitchen atorvastatin (LIPITOR) 40 MG tablet Take 1 tablet (40 mg total) by mouth daily. 30 tablet 0  . AURYXIA 1 GM 210 MG(Fe) tablet Take 420 mg by mouth 3 (three) times daily with meals.     . B Complex-C-Folic Acid (DIALYVITE 009) 0.8 MG WAFR Take 1 tablet by mouth daily.   11  . carvedilol (COREG) 6.25 MG tablet Take 6.25 mg by mouth 2 (two) times daily.    . cinacalcet (SENSIPAR) 30 MG tablet Take 30 mg by mouth daily.    Marland Kitchen lactulose (CHRONULAC) 10 GM/15ML solution Take 20 g by mouth  daily as needed for mild constipation.    Marland Kitchen losartan (COZAAR) 50 MG tablet Take 50 mg by mouth daily.    . ondansetron (ZOFRAN) 8 MG tablet Take 8 mg by mouth 2 (two) times daily as needed for nausea or vomiting.   0  . zolpidem (AMBIEN) 5 MG tablet Take 1 tablet (5 mg total) by mouth at bedtime as needed for sleep. 20 tablet 0    ROS: As per HPI. Comprehensive ROS deferred due to acuity of presentation.  Physical Examination: Pulse 68.  HEENT: [place]/AT Lungs: Respirations unlabored Ext: No edema  Neurologic Examination: Mental Status: Awake but drowsy with decreased level of alertness and poor attention. Exhibits some confusion. Speech is with subtle dysarthria but is fluent with intact comprehension for some questions, but has difficulty with several commands in the context of confusion. Oriented to the day, month, city and state but not the year. No hemineglect noted.  Cranial Nerves: II:  Temporal visual fields intact without extinction to DSS. Blink to threat intact. PERRL.  III,IV, VI: No ptosis. EOMI. No nystagmus.  V,VII: Temp sensation equal bilaterally. Mild left facial droop.  VIII: Hearing  intact to voice IX,X: No hypophonia XI: Symmetric XII: Midline tongue extension  Motor: RUE 5/5 LUE 4/5 RLE 4/5 hip flexion, knee flexion, knee extension. 5/5 ADF.  LLE: 4/5 hip flexion, knee flexion, knee extension. 5/5 ADF.  Positive orbiting fingers test on the left.  Positive Barre on the left.  Sensory: Decreased FT sensation on the left. No extinction to DSS.  Deep Tendon Reflexes:  Bilateral brachioradialis and biceps 2+ Right patellar 2+, left patellar 1+ 0 achilles bilaterally Right toe downgoing, left toe equivocal Cerebellar: No ataxia with FNF bilaterally, but with slow, deliberate movements and more difficulty targeting her finger on the left.   Gait: Deferred  Results for orders placed or performed during the hospital encounter of 02/21/21 (from the past 48 hour(s))  Protime-INR     Status: None   Collection Time: 02/21/21  9:54 PM  Result Value Ref Range   Prothrombin Time 14.7 11.4 - 15.2 seconds   INR 1.2 0.8 - 1.2    Comment: (NOTE) INR goal varies based on device and disease states. Performed at [facility], [place] [address], [place], [place] [address]   APTT     Status: None   Collection Time: 02/21/21  9:54 PM  Result Value Ref Range   aPTT 29 24 - 36 seconds    Comment: Performed at [facility] [address], [place], [place] [address]  CBC     Status: Abnormal   Collection Time: 02/21/21  9:54 PM  Result Value Ref Range   WBC 5.9 4.0 - 10.5 K/uL   RBC 3.74 (L) 3.87 - 5.11 MIL/uL   Hemoglobin 11.4 (L) 12.0 - 15.0 g/dL   HCT 36.7 36.0 - 46.0 %   MCV 98.1 80.0 - 100.0 fL   MCH 30.5 26.0 - 34.0 pg   MCHC 31.1 30.0 - 36.0 g/dL   RDW 16.0 (H) 11.5 - 15.5 %   Platelets 213 150 - 400 K/uL   nRBC 0.0 0.0 - 0.2 %    Comment: Performed at [facility], [place] [address], [place], [place] [address]  Differential     Status: None   Collection Time: 02/21/21  9:54 PM  Result Value Ref Range   Neutrophils Relative % 55 %   Neutro Abs 3.3  1.7 -  7.7 K/uL   Lymphocytes Relative 25 %   Lymphs Abs 1.5 0.7 - 4.0 K/uL   Monocytes Relative 15 %   Monocytes Absolute 0.9 0.1 - 1.0 K/uL   Eosinophils Relative 4 %   Eosinophils Absolute 0.2 0.0 - 0.5 K/uL   Basophils Relative 1 %   Basophils Absolute 0.0 0.0 - 0.1 K/uL   Immature Granulocytes 0 %   Abs Immature Granulocytes 0.02 0.00 - 0.07 K/uL    Comment: Performed at [facility] [address] [place], [place] [address]  CBG monitoring, ED     Status: None   Collection Time: 02/21/21  9:55 PM  Result Value Ref Range   Glucose-Capillary 84 70 - 99 mg/dL    Comment: Glucose reference range applies only to samples taken after fasting for at least 8 hours.  I-stat chem 8, ED     Status: Abnormal   Collection Time: 02/21/21  9:57 PM  Result Value Ref Range   Sodium 142 135 - 145 mmol/L   Potassium 4.1 3.5 - 5.1 mmol/L   Chloride 100 98 - 111 mmol/L   BUN 25 (H) 8 - 23 mg/dL   Creatinine, Ser 3.80 (H) 0.44 - 1.00 mg/dL   Glucose, Bld 100 (H) 70 - 99 mg/dL    Comment: Glucose reference range applies only to samples taken after fasting for at least 8 hours.   Calcium, Ion 0.99 (L) 1.15 - 1.40 mmol/L   TCO2 35 (H) 22 - 32 mmol/L   Hemoglobin 12.2 12.0 - 15.0 g/dL   HCT 36.0 36.0 - 46.0 %   CT HEAD CODE STROKE WO CONTRAST  Result Date: 02/21/2021 CLINICAL DATA:  Code stroke.  Slurred speech EXAM: CT HEAD WITHOUT CONTRAST TECHNIQUE: Contiguous axial images were obtained from the base of the skull through the vertex without intravenous contrast. COMPARISON:  Head CT 05/13/2018 FINDINGS: Brain: There is no mass, hemorrhage or extra-axial collection. There is generalized atrophy without lobar predilection. There is hypoattenuation of the periventricular white matter, most commonly indicating chronic ischemic microangiopathy. Vascular: Atherosclerotic calcification of the vertebral and internal carotid arteries at the skull base. No abnormal hyperdensity of the major intracranial  arteries or dural venous sinuses. Skull: The visualized skull base, calvarium and extracranial soft tissues are normal. Upper cervical spinal canal appears narrowed. Sinuses/Orbits: No fluid levels or advanced mucosal thickening of the visualized paranasal sinuses. No mastoid or middle ear effusion. The orbits are normal. ASPECTS [facility] Stroke Program Early CT Score) - Ganglionic level infarction (caudate, lentiform nuclei, internal capsule, insula, M1-M3 cortex): 7 - Supraganglionic infarction (M4-M6 cortex): 3 Total score (0-10 with 10 being normal): 10 IMPRESSION: 1. No acute intracranial abnormality. 2. ASPECTS is 10. 3. 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 These results were communicated to Dr. Kerney Elbe at 10:12 pm on 02/21/2021 by text page via the [facility] messaging system. Electronically Signed   By: Ulyses Jarred M.D.   On: 02/21/2021 22:12    Assessment: 70 y.o. female presenting via EMS from home as a Code Stroke with acute onset of left facial droop, confusion and slurred speech. LKN was 8:00 PM. She received dialysis today and arrived back home at 6:00 PM without any deficits at that time. 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 She has an MRI from 2016 which documents chronic cerebral microhemorrhages. 1. Exam reveals patchy abnormalities  not referable to a single neurological localization.  2. CT head negative for acute abnormality. 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 3. MRI brain: No acute intracranial abnormality. Unchanged appearance of multiple chronic microhemorrhages and chronic small vessel disease.  4. MRA brain: Mild left P1-2 junction stenosis.  Otherwise normal MRA. 5. HgbA1c is normal. Fasting lipid panel with mildly elevated LDL at 117.  Stroke Risk Factors - CAD, CHF, HLD, HTN, previous TIA and multiple old lacunes on prior imaging.  Recommendations: 1. Carotid  ultrasound 2. PT consult, OT consult, Speech consult 3. TTE 4. Cardiac telemetry 5. Prophylactic therapy- Continue ASA and atorvastatin 6. Risk factor modification 7. Frequent neuro checks   @Electronically  signed: Dr. Kerney Elbe  02/21/2021, 10:23 PM

## 2021-02-21 NOTE — H&P (Signed)
History and Physical    [name] HUD:[id number] DOB: 12/19/1950 DOA: 02/21/2021  PCP: [name], MD   Patient coming from:   Home  Chief Complaint: Slurred speech, facial droop  HPI: [name] is a 70 y.o. female with medical history significant for HTN, ESRD on HD, GERD, CAD, HLD who presents as code stroke by EMS from home with complaint of confusion, slurred speech and abnormal gait.  He reports that she had a normal dialysis session this afternoon and when she returned home around 6 PM she was feeling fine.  Is at home with her elderly mother who she helps care for.  She states around 830 or 9 she noticed some slurred speech and some drooping of her left side of her face.  She also had faulty walking as she was off balance.  She states she did not fall.  She denies any chest pain or palpitations.  States she has not had any fever, cough, shortness of breath.  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  She did not notice any change in her swallowing.  She did not have any seizure activity or syncope.  Has history of smoking but quit approximately 30 years ago.  Denies alcohol use  ED Course: She was not a TPA candidate secondary to previous intracranial bleed.  Head CT did not show any acute abnormality.  Neurology was consulted and has seen patient.  Review of Systems:  General: Denies fever, chills, weight loss, night sweats.  Denies dizziness.  HENT: Denies head trauma, headache, denies change in hearing, tinnitus.  Denies nasal congestion or bleeding.  Denies sore throat, sores in mouth.  Denies difficulty swallowing Eyes: Denies blurry vision, pain in eye, drainage.  Denies discoloration of eyes. Neck: Denies pain.  Denies swelling.  Denies pain with movement. Cardiovascular: Denies chest pain, palpitations.  Denies edema.  Denies orthopnea Respiratory: Denies shortness of breath, cough.  Denies wheezing.  Denies sputum  production Gastrointestinal: Denies abdominal pain, swelling.  Denies nausea, vomiting, diarrhea.  Denies melena.  Denies hematemesis. Musculoskeletal: Denies limitation of movement.  Denies deformity or swelling.  Genitourinary: Denies pelvic pain.  Denies urinary frequency or hesitancy.  Denies dysuria.  Skin: Denies rash.  Denies petechiae, purpura, ecchymosis. Neurological: Reports weakness and numbness of right arm and weakness of right leg. Reports ataxia. Reports mild slurring of speech. Denies headache.  Denies syncope.  Denies seizure activity.  Denies visual change. Psychiatric: Denies depression, anxiety. Denies hallucinations.  Past Medical History:  Diagnosis Date  . Adenomatous polyp of colon 10/2010, 2006, 2015  . Anemia in CKD (chronic kidney disease) 11/07/2012   s/p blood transfusion.   . Arthritis   . CAD (coronary artery disease)    "something like that"  . CHF (congestive heart failure) ([place])   . Constipation   . Depression with anxiety   . Diverticula, colon   . 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  Had some early rejection then stable function for years, then had slow decline of function and went back on hemodialysis in 2012.  Gets HD TTS schedule at [facility] on [facility] still using L forearm AVF.     Marland Kitchen GERD (gastroesophageal reflux disease)   . GI bleed 2017   felt to be ischemic colitis, last colo 2015  . Headache   . Hyperlipidemia   . Hypertension   .  Neurologic gait dysfunction   . Neuromuscular disorder (HCC)    neuropathy hand and legs  . Osteoporosis   . Pneumonia   . 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 Pulmonary edema 12/2019  . Stroke [facility]) 11/2015   TIA  . Weight loss, unintentional     Past Surgical History:  Procedure Laterality Date  . AV FISTULA PLACEMENT     for dialysis  . AV FISTULA PLACEMENT Left 11/22/2015    Procedure: ARTERIOVENOUS (AV) FISTULA CREATION-LEFT BRACHIOCEPHALIC;  Surgeon: Serafina Mitchell, MD;  Location: [place];  Service: Vascular;  Laterality: Left;  . AV FISTULA PLACEMENT Right 03/15/2020   Procedure: INSERTION OF ARTERIOVENOUS (AV) GORE-TEX GRAFT ARM ( BRACHIAL AXILLARY );  Surgeon: Katha Cabal, MD;  Location: [facility];  Service: Vascular;  Laterality: Right;  . BACK SURGERY    . CERVICAL FUSION    . CHOLECYSTECTOMY  12/02/2012   Procedure: LAPAROSCOPIC CHOLECYSTECTOMY WITH INTRAOPERATIVE CHOLANGIOGRAM;  Surgeon: Edward Jolly, MD;  Location: [place];  Service: General;  Laterality: N/A;  . EYE SURGERY Bilateral    cataract surgery  . EYE SURGERY Left 2019   laser  . HEMATOMA EVACUATION Left 12/24/2016   Procedure: EVACUATION HEMATOMA LEFT UPPER ARM;  Surgeon: Waynetta Sandy, MD;  Location: [place];  Service: Vascular;  Laterality: Left;  . I & D EXTREMITY Left 12/31/2016   Procedure: IRRIGATION AND DEBRIDEMENT EXTREMITY;  Surgeon: Angelia Mould, MD;  Location: [place];  Service: Vascular;  Laterality: Left;  . INSERTION OF DIALYSIS CATHETER Right 12/24/2016   Procedure: INSERTION OF DIALYSIS CATHETER;  Surgeon: Waynetta Sandy, MD;  Location: [place];  Service: Vascular;  Laterality: Right;  . INSERTION OF DIALYSIS CATHETER Right 02/04/2017   Procedure: INSERTION OF DIALYSIS CATHETER;  Surgeon: Waynetta Sandy, MD;  Location: [place];  Service: Vascular;  Laterality: Right;  . KIDNEY TRANSPLANT  1996  . PERIPHERAL VASCULAR CATHETERIZATION Left 10/23/2016   Procedure: Fistulagram;  Surgeon: Elam Dutch, MD;  Location: [place] [facility];  Service: Cardiovascular;  Laterality: Left;  . PERIPHERAL VASCULAR THROMBECTOMY Right 04/16/2020   Procedure: PERIPHERAL VASCULAR THROMBECTOMY;  Surgeon: Katha Cabal, MD;  Location: [place] [facility];  Service: Cardiovascular;  Laterality: Right;  . RESECTION OF ARTERIOVENOUS FISTULA ANEURYSM Left  11/22/2015   Procedure: RESECTION OF LEFT RADIOCEPHALIC FISTULA ANEURYSM ;  Surgeon: Serafina Mitchell, MD;  Location: [place];  Service: Vascular;  Laterality: Left;  . REVISON OF ARTERIOVENOUS FISTULA Left 12/22/2016   Procedure: REVISON OF LEFT ARTERIOVENOUS FISTULA;  Surgeon: Waynetta Sandy, MD;  Location: [place];  Service: Vascular;  Laterality: Left;  . REVISON OF ARTERIOVENOUS FISTULA Left 02/04/2017   Procedure: REVISON OF LEFT UPPER ARM ARTERIOVENOUS FISTULA;  Surgeon: Waynetta Sandy, MD;  Location: [place];  Service: Vascular;  Laterality: Left;  . UPPER EXTREMITY ANGIOGRAPHY Bilateral 09/19/2019   Procedure: UPPER EXTREMITY ANGIOGRAPHY;  Surgeon: Katha Cabal, MD;  Location: [place] [facility];  Service: Cardiovascular;  Laterality: Bilateral;    Social History  reports that she quit smoking about 29 years ago. Her smoking use included cigarettes. She has never used smokeless tobacco. She reports that she does not drink alcohol and does not use drugs.  Allergies  Allergen Reactions  . Sulfa Antibiotics Other (See Comments)    Per patient, both parents allergic-so will not take  . Adhesive [Tape] Itching    Family  History  Problem Relation Age of Onset  . Colon cancer Brother   . Cancer Brother   . Coronary artery disease Mother 28  . Hyperlipidemia Mother   . Hypertension Mother   . Stroke Maternal Aunt   . Esophageal cancer Neg Hx   . Stomach cancer Neg Hx   . Rectal cancer Neg Hx      Prior to Admission medications   Medication Sig Start Date End Date Taking? Authorizing Provider  acetaminophen (TYLENOL) 500 MG tablet Take 1 tablet (500 mg total) by mouth every 6 (six) hours as needed. 09/05/18   Law, Bea Graff, PA-C  allopurinol (ZYLOPRIM) 300 MG tablet Take 150 mg by mouth daily.     [provider]  ALPRAZolam Duanne Moron) 0.25 MG tablet Take 0.25 mg by mouth daily as needed for anxiety.     [provider]  aspirin EC 81 MG EC  tablet Take 1 tablet (81 mg total) by mouth daily. 05/14/20   Black, Lezlie Octave, NP  atorvastatin (LIPITOR) 40 MG tablet Take 1 tablet (40 mg total) by mouth daily. 04/02/20 07/05/29  Amin, Ankit Chirag, MD  AURYXIA 1 GM 210 MG(Fe) tablet Take 420 mg by mouth 3 (three) times daily with meals.     [provider]  B Complex-C-Folic Acid (DIALYVITE 659) 0.8 MG WAFR Take 1 tablet by mouth daily.  10/25/18   [provider]  carvedilol (COREG) 6.25 MG tablet Take 6.25 mg by mouth 2 (two) times daily. 08/28/20   [provider]  cinacalcet (SENSIPAR) 30 MG tablet Take 30 mg by mouth daily. 01/04/20   [provider]  lactulose (CHRONULAC) 10 GM/15ML solution Take 20 g by mouth daily as needed for mild constipation.    [provider]  losartan (COZAAR) 50 MG tablet Take 50 mg by mouth daily. 09/25/20   [provider]  ondansetron (ZOFRAN) 8 MG tablet Take 8 mg by mouth 2 (two) times daily as needed for nausea or vomiting.     [provider]  zolpidem (AMBIEN) 5 MG tablet Take 1 tablet (5 mg total) by mouth at bedtime as needed for sleep. 07/27/20   Donne Hazel, MD    Physical Exam: Vitals:   02/21/21 2217 02/21/21 2220 02/21/21 2245 02/21/21 2315  BP:  (!) 114/92 (!) 148/69 (!) 147/83  Pulse: 68  73 69  Resp:  13 17 19   Temp:   97.6 F (36.4 C)   TempSrc:   Oral   SpO2:   98% 98%    Constitutional: NAD, calm, comfortable Vitals:   02/21/21 2217 02/21/21 2220 02/21/21 2245 02/21/21 2315  BP:  (!) 114/92 (!) 148/69 (!) 147/83  Pulse: 68  73 69  Resp:  13 17 19   Temp:   97.6 F (36.4 C)   TempSrc:   Oral   SpO2:   98% 98%   General: WDWN, Alert and oriented to self and place. Drowsy.  Eyes: EOMI, PERRL, conjunctivae normal.  Sclera nonicteric HENT:  [facility]/AT, external ears normal.  Nares patent without epistasis.  Mucous membranes are moist. Posterior pharynx clear of any exudate or lesions. Neck: Soft, normal range of motion,  supple, no masses, no thyromegaly  Trachea midline Respiratory: clear to auscultation bilaterally, no wheezing, no crackles. Normal respiratory effort. No accessory muscle use.  Cardiovascular: Regular rate and rhythm, no murmurs / rubs / gallops. No extremity edema. 1+ pedal pulses. No carotid bruits.  Abdomen: Soft, no tenderness, nondistended, no  rebound or guarding.  No masses palpated. Bowel sounds normoactive Musculoskeletal: FROM. no cyanosis. No joint deformity upper and lower extremities. Normal muscle tone. AV fistulas in upper arms bilaterally.  Skin: Warm, dry, intact no rashes, lesions, ulcers. No induration Neurologic: Mild droop of left face. Speech slow and mildly thickened.  Sensation intact, patella DTR +1 bilaterally. Strength 5/5 in all extremities.  Normal extension of tongue without deviation. Psychiatric: Normal judgment and insight.  Normal mood.    Labs on Admission: I have personally reviewed following labs and imaging studies  CBC: Recent Labs  Lab 02/21/21 2154 02/21/21 2157  WBC 5.9  --   NEUTROABS 3.3  --   HGB 11.4* 12.2  HCT 36.7 36.0  MCV 98.1  --   PLT 213  --     Basic Metabolic Panel: Recent Labs  Lab 02/21/21 2154 02/21/21 2157  NA 143 142  K 4.2 4.1  CL 98 100  CO2 34*  --   GLUCOSE 100* 100*  BUN 19 25*  CREATININE 3.73* 3.80*  CALCIUM 9.4  --     GFR: Estimated Creatinine Clearance: 10.9 mL/min (A) (by C-G formula based on SCr of 3.8 mg/dL (H)).  Liver Function Tests: Recent Labs  Lab 02/21/21 2154  AST 22  ALT 10  ALKPHOS 110  BILITOT 0.8  PROT 7.3  ALBUMIN 3.5    Urine analysis:    Component Value Date/Time   COLORURINE YELLOW 01/30/2011 1648   APPEARANCEUR CLOUDY (A) 01/30/2011 1648   LABSPEC 1.016 01/30/2011 1648   PHURINE 5.0 01/30/2011 1648   GLUCOSEU NEGATIVE 10/14/2009 0747   HGBUR SMALL (A) 01/30/2011 1648   BILIRUBINUR NEGATIVE 01/30/2011 1648   KETONESUR NEGATIVE 01/30/2011 1648   PROTEINUR 100 (A)  01/30/2011 1648   UROBILINOGEN 0.2 01/30/2011 1648   NITRITE NEGATIVE 01/30/2011 1648   LEUKOCYTESUR SMALL (A) 01/30/2011 1648    Radiological Exams on Admission: CT HEAD CODE STROKE WO CONTRAST  Result Date: 02/21/2021 CLINICAL DATA:  Code stroke.  Slurred speech EXAM: CT HEAD WITHOUT CONTRAST TECHNIQUE: Contiguous axial images were obtained from the base of the skull through the vertex without intravenous contrast. COMPARISON:  Head CT 05/13/2018 FINDINGS: Brain: There is no mass, hemorrhage or extra-axial collection. There is generalized atrophy without lobar predilection. There is hypoattenuation of the periventricular white matter, most commonly indicating chronic ischemic microangiopathy. Vascular: Atherosclerotic calcification of the vertebral and internal carotid arteries at the skull base. No abnormal hyperdensity of the major intracranial arteries or dural venous sinuses. Skull: The visualized skull base, calvarium and extracranial soft tissues are normal. Upper cervical spinal canal appears narrowed. Sinuses/Orbits: No fluid levels or advanced mucosal thickening of the visualized paranasal sinuses. No mastoid or middle ear effusion. The orbits are normal. ASPECTS [facility] Stroke Program Early CT Score) - Ganglionic level infarction (caudate, lentiform nuclei, internal capsule, insula, M1-M3 cortex): 7 - Supraganglionic infarction (M4-M6 cortex): 3 Total score (0-10 with 10 being normal): 10 IMPRESSION: 1. No acute intracranial abnormality. 2. ASPECTS is 10. 3. 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 These results were communicated to Dr. Kerney Elbe at 10:12 pm on 02/21/2021 by text page via the [facility] messaging system. Electronically Signed   By: Ulyses Jarred M.D.   On: 02/21/2021 22:12    EKG: Independently reviewed.  EKG shows normal sinus rhythm last week ST changes in the anterior lateral leads.  Prolonged QTc 509  Assessment/Plan Principal Problem:   CVA (cerebral vascular  accident) Ms. Fakhouri admitted on medical  telemetry floor for TIA/CVA.  Will obtain MRI of brain to rule out acute ischemic CVA.  Will evaluate carotids for large vessel occlusion/stenosis. Obtain echocardiogram to evaluate for PFO, wall motion and ejection fraction. Hypertension of 220/110 will be allowed for 24 hours per stroke protocol.  After which blood pressure will be slowly reduced to goal level. Antiplatelet therapy with aspirin daily. Continue statin therapy.  Check lipid panel.  Neurochecks per stroke protocol  Active Problems:   End-stage renal disease on hemodialysis  Chronic, on HD MWF    HTN (hypertension) Permissive HTN for 24 hours as above then resume home antihypertensives. Monitor BP    Hyperlipidemia Continue statin therapy. Check lipid panel    Gait instability Consult PT for evaluation. Fall precautions.     Prolonged QT interval Avoid medications which could further prolong QT interval    DVT prophylaxis: SCDs for DVT prophylaxis Code Status:   Full code  Family Communication:  Agnes and plan discussed with patient.  Patient verbalized understanding agrees with plan.  Further recommendation follow as clinically indicated Disposition Plan:   Patient is from:             home  Anticipated DC to:  Home  Anticipated DC date:  Anticipate 2 midnight stay in hospital to treat acute condition  Anticipated DC barriers: No barriers to discharge identified at this time  Consults called:  Neurology, Dr. Cheral Marker  Admission status:  inpatient   Yevonne Aline Chotiner MD [facility]  How to contact the [facility] Attending or Consulting provider [facility] or covering provider during after hours [place], for this patient?   1. Check the care team in [facility] and look for a) attending/consulting TRH provider listed and b) the [facility] team listed 2. Log into [email or web address] and use [place] universal password to access. If you do not have the password, please contact the hospital  operator. 3. Locate the [facility] provider you are looking for under [facility] and page to a number that you can be directly reached. 4. If you still have difficulty reaching the provider, please page the [facility] (Director on Call) for the Hospitalists listed on amion for assistance.  02/21/2021, 11:45 PM

## 2021-02-21 NOTE — Code Documentation (Signed)
Stroke Response Nurse Documentation Code Documentation  AARIAN CLEAVER is a 70 y.o. female arriving to [place] ED via [facility] on 3/11 with past medical hx of ESRD, TIA, HTN, Seizures. Code stroke was activated by EMS. Patient from home where she was LKW at 2000 and now complaining of headache and slurred speech. Pt had HD earlier today. On aspirin 81 mg daily. Stroke team at the bedside on patient arrival. Labs drawn and patient cleared for CT by EDP. Patient to CT with team. NIHSS 3, see documentation for details and code stroke times. Patient with bilateral leg weakness and left decreased sensation on exam. The following imaging was completed:  CT. Patient is not a candidate for tPA due to previous cerebral microhemorrhages per MRI. Bedside handoff with ED RN Jazmine.    [name]  Rapid Response RN

## 2021-02-22 ENCOUNTER — Encounter (HOSPITAL_COMMUNITY): Payer: Self-pay | Admitting: Family Medicine

## 2021-02-22 ENCOUNTER — Inpatient Hospital Stay (HOSPITAL_COMMUNITY): Payer: Medicare HMO

## 2021-02-22 ENCOUNTER — Ambulatory Visit (HOSPITAL_COMMUNITY): Payer: Medicare HMO

## 2021-02-22 ENCOUNTER — Other Ambulatory Visit: Payer: Self-pay

## 2021-02-22 DIAGNOSIS — I6389 Other cerebral infarction: Secondary | ICD-10-CM

## 2021-02-22 DIAGNOSIS — I639 Cerebral infarction, unspecified: Secondary | ICD-10-CM

## 2021-02-22 LAB — ECHOCARDIOGRAM COMPLETE
AR max vel: 2.43 cm2
AV Area VTI: 2.35 cm2
AV Area mean vel: 2.48 cm2
AV Mean grad: 5.9 mmHg
AV Peak grad: 12.6 mmHg
Ao pk vel: 1.78 m/s
Area-P 1/2: 4.21 cm2
MV M vel: 5.56 m/s
MV Peak grad: 123.7 mmHg
Radius: 0.4 cm
S' Lateral: 2.5 cm
Single Plane A2C EF: 63.5 %

## 2021-02-22 LAB — HEMOGLOBIN A1C
Hgb A1c MFr Bld: 5 % (ref 4.8–5.6)
Mean Plasma Glucose: 96.8 mg/dL

## 2021-02-22 LAB — LIPID PANEL
Cholesterol: 191 mg/dL (ref 0–200)
HDL: 57 mg/dL (ref >40)
LDL Cholesterol: 117 mg/dL — ABNORMAL HIGH (ref 0–99)
Total CHOL/HDL Ratio: 3.4 RATIO
Triglycerides: 87 mg/dL (ref <150)
VLDL: 17 mg/dL (ref 0–40)

## 2021-02-22 MED ORDER — CINACALCET HCL 30 MG PO TABS
30.0000 mg | ORAL_TABLET | Freq: Every day | ORAL | Status: DC
  Administered 2021-02-22 – 2021-02-25 (×3): 30 mg via ORAL
  Filled 2021-02-22 (×4): qty 1

## 2021-02-22 MED ORDER — ACETAMINOPHEN 325 MG PO TABS
650.0000 mg | ORAL_TABLET | ORAL | Status: DC | PRN
  Administered 2021-02-22 – 2021-02-25 (×3): 650 mg via ORAL
  Filled 2021-02-22 (×3): qty 2

## 2021-02-22 MED ORDER — RENA-VITE PO TABS
1.0000 | ORAL_TABLET | Freq: Every day | ORAL | Status: DC
  Administered 2021-02-22 – 2021-02-24 (×3): 1 via ORAL
  Filled 2021-02-22 (×4): qty 1

## 2021-02-22 MED ORDER — ASPIRIN EC 81 MG PO TBEC
81.0000 mg | DELAYED_RELEASE_TABLET | Freq: Every day | ORAL | Status: DC
  Administered 2021-02-22 – 2021-02-25 (×4): 81 mg via ORAL
  Filled 2021-02-22 (×5): qty 1

## 2021-02-22 MED ORDER — ATORVASTATIN CALCIUM 40 MG PO TABS
40.0000 mg | ORAL_TABLET | Freq: Every day | ORAL | Status: DC
  Administered 2021-02-22 – 2021-02-24 (×3): 40 mg via ORAL
  Filled 2021-02-22 (×3): qty 1

## 2021-02-22 MED ORDER — ACETAMINOPHEN 650 MG RE SUPP: 650.0000 mg | RECTAL | Status: DC | PRN

## 2021-02-22 MED ORDER — ALPRAZOLAM 0.25 MG PO TABS
0.2500 mg | ORAL_TABLET | Freq: Every day | ORAL | Status: DC | PRN
  Administered 2021-02-22 – 2021-02-24 (×3): 0.25 mg via ORAL
  Filled 2021-02-22 (×3): qty 1

## 2021-02-22 MED ORDER — STROKE: EARLY STAGES OF RECOVERY BOOK: Freq: Once | Status: DC

## 2021-02-22 MED ORDER — ACETAMINOPHEN 160 MG/5ML PO SOLN: 650.0000 mg | ORAL | Status: DC | PRN

## 2021-02-22 NOTE — ED Notes (Addendum)
physician's at bedside. Phone report called to [employer], all questions answered.

## 2021-02-22 NOTE — Evaluation (Signed)
Physical Therapy Evaluation Patient Details Name: [name] MRN: [id number] DOB: 1951/10/06 Today's Date: 02/22/2021   History of Present Illness  [name] is a 70 y/o female who presented to ED on 02/21/21 with slurred speech, L facial droop, confusion, and R sided weakness. MRI and CT negative for acute infarct, but showing possible spinal stenosis around C2 level. PMH includes ESRD on HD, previous TIAs, HTN, and seizures.    Clinical Impression  Pt received in ED stretcher/bed, cooperative and pleasant. No significant findings on neuro, strength, and sensation screen. Needing mod A for sit to supine to pull into upright position but suspect she could complete it herself with use of bed rails. Concerned for getting pt back into tall ED stretcher so did not ambulate with pt. Will need to see pt ambulate with RW for better idea of her mobility level. Pt reported multiple falls at home and unsure of how much support family can provide. Would benefit from continued PT to address strength, mobility, and balance. Left in ED stretcher/bed with all needs met, call bell within reach, and RN aware of status. Will continue to monitor acutely.    Follow Up Recommendations SNF;Supervision for mobility/OOB;Other (comment) (May be able to progress to home health, especially if family is able to provide more support)    Equipment Recommendations  3in1 (PT)    Recommendations for Other Services       Precautions / Restrictions Precautions Precautions: Fall Restrictions Weight Bearing Restrictions: No      Mobility  Bed Mobility Overal bed mobility: Needs Assistance Bed Mobility: Supine to Sit;Sit to Supine;Rolling Rolling: Min guard (Use of bed/stretcher rails)   Supine to sit: Mod assist;HOB elevated Sit to supine: Min guard   General bed mobility comments: Mod A to to bring trunk upright from sidelying with HOB elevated, no assist needed for sit to supine    Transfers                  General transfer comment: deferred  Ambulation/Gait             General Gait Details: deferred  Stairs            Wheelchair Mobility    Modified Rankin (Stroke Patients Only)       Balance Overall balance assessment: Needs assistance Sitting-balance support: Feet unsupported;No upper extremity supported Sitting balance-Leahy Scale: Fair                                       Pertinent Vitals/Pain Pain Assessment: Faces Faces Pain Scale: Hurts even more Pain Location: R posterior headache and L arm Pain Descriptors / Indicators: Headache;Throbbing;Discomfort Pain Intervention(s): Monitored during session    Home Living Family/patient expects to be discharged to:: Private residence Living Arrangements: Parent (mother has dementia) Available Help at Discharge: Family;Other (Comment);Available PRN/intermittently (son and wife live in [place], oldest son lives close but caregiver for wife with MS) Type of Home: House Home Access: Stairs to enter Entrance Stairs-Rails: Left Entrance Stairs-Number of Steps: 3 STE Home Layout: One level Home Equipment: Shower seat;Cane - single point;Walker - 2 wheels      Prior Function Level of Independence: Independent with assistive device(s)         Comments: Uses RW to get around, feels that RW is best for her for more stability, reports multiple falls at home     Hand Dominance  Dominant Hand: Right    Extremity/Trunk Assessment   Upper Extremity Assessment Upper Extremity Assessment: Generalized weakness;RUE deficits/detail;LUE deficits/detail RUE Sensation: WNL LUE Deficits / Details: Slightly weaker on L UE, but pt reporting pain from hitting her L arm when she fell LUE: Unable to fully assess due to pain LUE Sensation: WNL    Lower Extremity Assessment Lower Extremity Assessment: Generalized weakness;RLE deficits/detail;LLE deficits/detail RLE Sensation: WNL LLE Sensation:  WNL       Communication   Communication: No difficulties  Cognition Arousal/Alertness: Awake/alert Behavior During Therapy: WFL for tasks assessed/performed Overall Cognitive Status: No family/caregiver present to determine baseline cognitive functioning                                 General Comments: A&Ox4. Responds appropriately to questions, but unsure how close pt is to baseline. No family present to confirm. Unsure of what day it is and questioning her HD schedule.      General Comments General comments (skin integrity, edema, etc.): VSS on RA.    Exercises     Assessment/Plan    PT Assessment Patient needs continued PT services  PT Problem List Decreased strength;Decreased mobility;Decreased coordination;Decreased safety awareness;Decreased activity tolerance;Decreased balance;Decreased knowledge of use of DME       PT Treatment Interventions DME instruction;Therapeutic exercise;Gait training;Balance training;Stair training;Neuromuscular re-education;Therapeutic activities;Functional mobility training;Cognitive remediation;Patient/family education    PT Goals (Current goals can be found in the Care Plan section)  Acute Rehab PT Goals Patient Stated Goal: headache to go away PT Goal Formulation: With patient Time For Goal Achievement: 03/08/21 Potential to Achieve Goals: Good    Frequency Min 3X/week   Barriers to discharge        Co-evaluation               AM-PAC PT "6 Clicks" Mobility  Outcome Measure Help needed turning from your back to your side while in a flat bed without using bedrails?: A Little Help needed moving from lying on your back to sitting on the side of a flat bed without using bedrails?: A Lot Help needed moving to and from a bed to a chair (including a wheelchair)?: A Little Help needed standing up from a chair using your arms (e.g., wheelchair or bedside chair)?: A Little Help needed to walk in hospital room?: A  Lot Help needed climbing 3-5 steps with a railing? : A Lot 6 Click Score: 15    End of Session   Activity Tolerance: Patient tolerated treatment well Patient left: Other (comment) (ED stretcher/bed) Nurse Communication: Mobility status PT Visit Diagnosis: Unsteadiness on feet (R26.81);History of falling (Z91.81)    Time:  -      Charges:         Rosita Kea, SPT

## 2021-02-22 NOTE — ED Notes (Signed)
Phone report called to [facility] RN, all questions answered.  

## 2021-02-22 NOTE — Progress Notes (Signed)
  Echocardiogram 2D Echocardiogram has been performed.  [name] 02/22/2021, 10:54 AM

## 2021-02-22 NOTE — Progress Notes (Signed)
PROGRESS NOTE    [name]  KDX:[id number] DOB: 1951/02/06 DOA: 02/21/2021 PCP: [name], MD     Brief Narrative:  [name] is a 70 y.o. female with medical history significant for HTN, ESRD on HD, GERD, CAD, HLD who presents as code stroke by EMS from home with complaint of confusion, slurred speech and abnormal gait.  She reports that she had a normal dialysis session this afternoon and when she returned home around 6 PM she was feeling fine.  She lives at home with her elderly mother who she helps care for.  She states around 830 or 9pm, she noticed some slurred speech and some drooping of her left side of her face.  She also had faulty walking as she was off balance.  She states she did not fall.  She denies any chest pain or palpitations.  States she has not had any fever, cough, shortness of breath.  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  She did not notice any change in her swallowing.  She did not have any seizure activity or syncope.  Has history of smoking but quit approximately 30 years ago.  Denies alcohol use.  Patient was seen by neurology as a code stroke.  She underwent head CT as well as MRI which were negative for any acute changes.  New events last 24 hours / Subjective: She states that she is feeling better, symptoms have all resolved back to her baseline.  Assessment & Plan:   Principal Problem:   CVA (cerebral vascular accident) ([place]) Active Problems:   End-stage renal disease on hemodialysis ([place])   HTN (hypertension)   Hyperlipidemia   Gait instability   Prolonged QT interval   TIA -CT head without acute intracranial abnormality -MRI/MRA brain without acute intracranial abnormality, unchanged appearance of multiple chronic microhemorrhages and chronic small vessel disease -Carotid ultrasound negative -Echocardiogram pending -Neurology following -PT OT SLP pending -Continue aspirin, statin  ESRD -On dialysis  MWF, last dialysis session was 3/11  Hypertension -Allow permissive hypertension.  Coreg and Cozaar on hold for now  Hyperlipidemia -Continue statin   DVT prophylaxis:  SCD's Start: 02/22/21 0131  Code Status: Full code Family Communication: No family at bedside Disposition Plan:  Status is: Inpatient  Remains inpatient appropriate because:Ongoing diagnostic testing needed not appropriate for outpatient work up   Dispo: The patient is from: Home              Anticipated d/c is to: Home              Patient currently is not medically stable to d/c.  Await further stroke team recommendation today.  PT OT SLP Pending   Difficult to place patient No      Consultants:   Neurology     Antimicrobials:  Anti-infectives (From admission, onward)   None        Objective: Vitals:   02/22/21 0615 02/22/21 0630 02/22/21 0800 02/22/21 1052  BP: (!) 126/57 (!) 129/52 (!) 117/51 (!) 122/35  Pulse: 77 77 76 70  Resp: 17 20 11 13   Temp:   98 F (36.7 C) (!) 97.3 F (36.3 C)  TempSrc:    Oral  SpO2: 94% 91% 93% 98%   No intake or output data in the 24 hours ending 02/22/21 1113 There were no vitals filed for this visit.  Examination:  General exam: Appears calm and comfortable  Respiratory system: Clear to auscultation. Respiratory  effort normal. No respiratory distress. No conversational dyspnea.  Cardiovascular system: S1 & S2 heard, RRR. No murmurs. No pedal edema. Gastrointestinal system: Abdomen is nondistended, soft and nontender. Normal bowel sounds heard. Central nervous system: Alert and oriented. No focal neurological deficits. Speech clear.  Extremities: Symmetric in appearance  Skin: No rashes, lesions or ulcers on exposed skin  Psychiatry: Judgement and insight appear normal. Mood & affect appropriate.   Data Reviewed: I have personally reviewed following labs and imaging studies  CBC: Recent Labs  Lab 02/21/21 2154 02/21/21 2157  WBC 5.9  --    NEUTROABS 3.3  --   HGB 11.4* 12.2  HCT 36.7 36.0  MCV 98.1  --   PLT 213  --    Basic Metabolic Panel: Recent Labs  Lab 02/21/21 2154 02/21/21 2157  NA 143 142  K 4.2 4.1  CL 98 100  CO2 34*  --   GLUCOSE 100* 100*  BUN 19 25*  CREATININE 3.73* 3.80*  CALCIUM 9.4  --    GFR: Estimated Creatinine Clearance: 10.9 mL/min (A) (by C-G formula based on SCr of 3.8 mg/dL (H)). Liver Function Tests: Recent Labs  Lab 02/21/21 2154  AST 22  ALT 10  ALKPHOS 110  BILITOT 0.8  PROT 7.3  ALBUMIN 3.5   No results for input(s): LIPASE, AMYLASE in the last 168 hours. No results for input(s): AMMONIA in the last 168 hours. Coagulation Profile: Recent Labs  Lab 02/21/21 2154  INR 1.2   Cardiac Enzymes: No results for input(s): CKTOTAL, CKMB, CKMBINDEX, TROPONINI in the last 168 hours. BNP (last 3 results) No results for input(s): PROBNP in the last 8760 hours. HbA1C: Recent Labs    02/22/21 0249  HGBA1C 5.0   CBG: Recent Labs  Lab 02/21/21 2155  GLUCAP 84   Lipid Profile: Recent Labs    02/22/21 0249  CHOL 191  HDL 57  LDLCALC 117*  TRIG 87  CHOLHDL 3.4   Thyroid Function Tests: No results for input(s): TSH, T4TOTAL, FREET4, T3FREE, THYROIDAB in the last 72 hours. Anemia Panel: No results for input(s): VITAMINB12, FOLATE, FERRITIN, TIBC, IRON, RETICCTPCT in the last 72 hours. Sepsis Labs: No results for input(s): PROCALCITON, LATICACIDVEN in the last 168 hours.  Recent Results (from the past 240 hour(s))  Resp Panel by RT-PCR (Flu A&B, Covid) Nasopharyngeal Swab     Status: None   Collection Time: 02/21/21 10:51 PM   Specimen: Nasopharyngeal Swab; Nasopharyngeal(NP) swabs in vial transport medium  Result Value Ref Range Status   SARS Coronavirus 2 by RT PCR NEGATIVE NEGATIVE Final    Comment: (NOTE) SARS-CoV-2 target nucleic acids are NOT DETECTED.  The SARS-CoV-2 RNA is generally detectable in upper respiratory specimens during the acute phase of  infection. The lowest concentration of SARS-CoV-2 viral copies this assay can detect is 138 copies/mL. A negative result does not preclude SARS-Cov-2 infection and should not be used as the sole basis for treatment or other patient management decisions. A negative result may occur with  improper specimen collection/handling, submission of specimen other than nasopharyngeal swab, presence of viral mutation(s) within the areas targeted by this assay, and inadequate number of viral copies(<138 copies/mL). A negative result must be combined with clinical observations, patient history, and epidemiological information. The expected result is Negative.  Fact Sheet for Patients:  EntrepreneurPulse.com.au  Fact Sheet for Healthcare Providers:  IncredibleEmployment.be  This test is no t yet approved or cleared by the [place] FDA and  has been  authorized for detection and/or diagnosis of SARS-CoV-2 by FDA under an Emergency Use Authorization (EUA). This EUA will remain  in effect (meaning this test can be used) for the duration of the COVID-19 declaration under Section 564(b)(1) of the Act, 21 U.S.C.section 360bbb-3(b)(1), unless the authorization is terminated  or revoked sooner.       Influenza A by PCR NEGATIVE NEGATIVE Final   Influenza B by PCR NEGATIVE NEGATIVE Final    Comment: (NOTE) The Xpert Xpress SARS-CoV-2/FLU/RSV plus assay is intended as an aid in the diagnosis of influenza from Nasopharyngeal swab specimens and should not be used as a sole basis for treatment. Nasal washings and aspirates are unacceptable for Xpert Xpress SARS-CoV-2/FLU/RSV testing.  Fact Sheet for Patients: EntrepreneurPulse.com.au  Fact Sheet for Healthcare Providers: IncredibleEmployment.be  This test is not yet approved or cleared by the [place] FDA and has been authorized for detection and/or diagnosis of SARS-CoV-2  by FDA under an Emergency Use Authorization (EUA). This EUA will remain in effect (meaning this test can be used) for the duration of the COVID-19 declaration under Section 564(b)(1) of the Act, 21 U.S.C. section 360bbb-3(b)(1), unless the authorization is terminated or revoked.  Performed at [facility], [place] [address] [place], [place] [address]       Radiology Studies: MR ANGIO HEAD WO CONTRAST  Result Date: 02/22/2021 CLINICAL DATA:  Slurred speech and abnormal gait EXAM: MRI HEAD WITHOUT CONTRAST MRA HEAD WITHOUT CONTRAST TECHNIQUE: Multiplanar, multiecho pulse sequences of the brain and surrounding structures were obtained without intravenous contrast. Angiographic images of the head were obtained using MRA technique without contrast. COMPARISON:  None. FINDINGS: MRI HEAD FINDINGS Brain: No acute infarct, mass effect or extra-axial collection. Numerous chronic microhemorrhages supratentorially and infratentorially. Distribution is unchanged. Hyperintense T2-weighted signal is moderately widespread throughout the white matter. Generalized volume loss without a clear lobar predilection. The midline structures are normal. Vascular: Major flow voids are preserved. Skull and upper cervical spine: Normal calvarium and skull base. Visualized upper cervical spine and soft tissues are normal. Sinuses/Orbits:No paranasal sinus fluid levels or advanced mucosal thickening. No mastoid or middle ear effusion. Normal orbits. MRA HEAD FINDINGS POSTERIOR CIRCULATION: --Vertebral arteries: Normal --Inferior cerebellar arteries: Normal. --Basilar artery: Normal. --Superior cerebellar arteries: Normal. --Posterior cerebral arteries: Mild left P1-2 junction stenosis. ANTERIOR CIRCULATION: --Intracranial internal carotid arteries: Normal. --Anterior cerebral arteries (ACA): Normal. --Middle cerebral arteries (MCA): Normal. ANATOMIC VARIANTS: None IMPRESSION: 1. No acute intracranial abnormality. 2. Unchanged  appearance of multiple chronic microhemorrhages and chronic small vessel disease. 3. Mild left P1-2 junction stenosis.  Otherwise normal MRA. Electronically Signed   By: Ulyses Jarred M.D.   On: 02/22/2021 00:51   MR Brain Wo Contrast (neuro protocol)  Result Date: 02/22/2021 CLINICAL DATA:  Slurred speech and abnormal gait EXAM: MRI HEAD WITHOUT CONTRAST MRA HEAD WITHOUT CONTRAST TECHNIQUE: Multiplanar, multiecho pulse sequences of the brain and surrounding structures were obtained without intravenous contrast. Angiographic images of the head were obtained using MRA technique without contrast. COMPARISON:  None. FINDINGS: MRI HEAD FINDINGS Brain: No acute infarct, mass effect or extra-axial collection. Numerous chronic microhemorrhages supratentorially and infratentorially. Distribution is unchanged. Hyperintense T2-weighted signal is moderately widespread throughout the white matter. Generalized volume loss without a clear lobar predilection. The midline structures are normal. Vascular: Major flow voids are preserved. Skull and upper cervical spine: Normal calvarium and skull base. Visualized upper cervical spine and soft tissues are normal. Sinuses/Orbits:No paranasal sinus fluid levels or advanced mucosal thickening. No  mastoid or middle ear effusion. Normal orbits. MRA HEAD FINDINGS POSTERIOR CIRCULATION: --Vertebral arteries: Normal --Inferior cerebellar arteries: Normal. --Basilar artery: Normal. --Superior cerebellar arteries: Normal. --Posterior cerebral arteries: Mild left P1-2 junction stenosis. ANTERIOR CIRCULATION: --Intracranial internal carotid arteries: Normal. --Anterior cerebral arteries (ACA): Normal. --Middle cerebral arteries (MCA): Normal. ANATOMIC VARIANTS: None IMPRESSION: 1. No acute intracranial abnormality. 2. Unchanged appearance of multiple chronic microhemorrhages and chronic small vessel disease. 3. Mild left P1-2 junction stenosis.  Otherwise normal MRA. Electronically Signed    By: Ulyses Jarred M.D.   On: 02/22/2021 00:51   CT HEAD CODE STROKE WO CONTRAST  Result Date: 02/21/2021 CLINICAL DATA:  Code stroke.  Slurred speech EXAM: CT HEAD WITHOUT CONTRAST TECHNIQUE: Contiguous axial images were obtained from the base of the skull through the vertex without intravenous contrast. COMPARISON:  Head CT 05/13/2018 FINDINGS: Brain: There is no mass, hemorrhage or extra-axial collection. There is generalized atrophy without lobar predilection. There is hypoattenuation of the periventricular white matter, most commonly indicating chronic ischemic microangiopathy. Vascular: Atherosclerotic calcification of the vertebral and internal carotid arteries at the skull base. No abnormal hyperdensity of the major intracranial arteries or dural venous sinuses. Skull: The visualized skull base, calvarium and extracranial soft tissues are normal. Upper cervical spinal canal appears narrowed. Sinuses/Orbits: No fluid levels or advanced mucosal thickening of the visualized paranasal sinuses. No mastoid or middle ear effusion. The orbits are normal. ASPECTS [facility] Stroke Program Early CT Score) - Ganglionic level infarction (caudate, lentiform nuclei, internal capsule, insula, M1-M3 cortex): 7 - Supraganglionic infarction (M4-M6 cortex): 3 Total score (0-10 with 10 being normal): 10 IMPRESSION: 1. No acute intracranial abnormality. 2. ASPECTS is 10. 3. 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 These results were communicated to Dr. Kerney Elbe at 10:12 pm on 02/21/2021 by text page via the [facility] messaging system. Electronically Signed   By: Ulyses Jarred M.D.   On: 02/21/2021 22:12   VAS US CAROTID (at [facility] and [facility] only)  Result Date: 02/22/2021 Carotid Arterial Duplex Study Indications:       Speech disturbance, Weakness and irregular gait. Risk Factors:      Hypertension, hyperlipidemia, past history of smoking,                    coronary artery disease. Other Factors:     ESRD on dialysis. Comparison  Study:  Prior normal carotid duplex done 02/22/18 Performing Technologist: Sharion Dove RVS  Examination Guidelines: A complete evaluation includes B-mode imaging, spectral Doppler, color Doppler, and power Doppler as needed of all accessible portions of each vessel. Bilateral testing is considered an integral part of a complete examination. Limited examinations for reoccurring indications may be performed as noted.  Right Carotid Findings: +----------+--------+--------+--------+------------------+---------+           PSV cm/sEDV cm/sStenosisPlaque DescriptionComments  +----------+--------+--------+--------+------------------+---------+ CCA Prox  88      11                                          +----------+--------+--------+--------+------------------+---------+ CCA Distal75      6                                           +----------+--------+--------+--------+------------------+---------+ ICA Prox  55      8  heterogenous      Shadowing +----------+--------+--------+--------+------------------+---------+ ICA Distal70      13                                          +----------+--------+--------+--------+------------------+---------+ ECA       196     9               calcific                    +----------+--------+--------+--------+------------------+---------+ +----------+--------+-------+--------+-------------------+           PSV cm/sEDV cmsDescribeArm Pressure (mmHG) +----------+--------+-------+--------+-------------------+ [id number]                                        +----------+--------+-------+--------+-------------------+ +---------+--------+--+--------+--+ VertebralPSV cm/s95EDV cm/s20 +---------+--------+--+--------+--+  Left Carotid Findings: +----------+--------+--------+--------+------------------+------------------+           PSV cm/sEDV cm/sStenosisPlaque DescriptionComments            +----------+--------+--------+--------+------------------+------------------+ CCA Prox  103     8                                 intimal thickening +----------+--------+--------+--------+------------------+------------------+ CCA Distal69      8                                 intimal thickening +----------+--------+--------+--------+------------------+------------------+ ICA Prox  59      12              heterogenous                         +----------+--------+--------+--------+------------------+------------------+ ICA Distal60      15                                                   +----------+--------+--------+--------+------------------+------------------+ ECA       62      12                                                   +----------+--------+--------+--------+------------------+------------------+ +----------+--------+--------+--------+-------------------+           PSV cm/sEDV cm/sDescribeArm Pressure (mmHG) +----------+--------+--------+--------+-------------------+ Subclavian117                                         +----------+--------+--------+--------+-------------------+ +---------+--------+--+--------+-+ VertebralPSV cm/s44EDV cm/s5 +---------+--------+--+--------+-+   Summary: Right Carotid: The extracranial vessels were near-normal with only minimal wall                thickening or plaque. Left Carotid: The extracranial vessels were near-normal with only minimal wall               thickening or plaque. Vertebrals:  Bilateral vertebral arteries demonstrate antegrade flow. Subclavians: Normal flow hemodynamics were seen in bilateral  subclavian              arteries. *See table(s) above for measurements and observations.     Preliminary       Scheduled Meds: .  stroke: mapping our early stages of recovery book   Does not apply Once  . aspirin EC  81 mg Oral Daily  . atorvastatin  40 mg Oral Daily  . cinacalcet  30 mg Oral Q  breakfast  . multivitamin  1 tablet Oral QHS   Continuous Infusions:   LOS: 1 day      Time spent: 30 minutes   Dessa Phi, DO [facility] 02/22/2021, 11:13 AM   Available via Epic secure chat 7am-7pm After these hours, please refer to coverage provider listed on [email or web address]

## 2021-02-22 NOTE — Procedures (Signed)
Patient Name: MYKHIA DANISH  MRN: [id number]  Epilepsy Attending: Lora Havens  Referring Physician/Provider: Dr Lestine Mount Date: 02/22/2021 Duration: 27.38 mins  Patient history:  70 y.o. 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 EEG to evaluate for seizure  Level of alertness: Awake  AEDs during EEG study: None  Technical aspects: This EEG study was done with scalp electrodes positioned according to the 10-20 International system of electrode placement. Electrical activity was acquired at a sampling rate of 500Hz  and reviewed with a high frequency filter of 70Hz  and a low frequency filter of 1Hz . EEG data were recorded continuously and digitally stored.   Description: The posterior dominant rhythm consists of 8-9 Hz activity of moderate voltage (25-35 uV) seen predominantly in posterior head regions, symmetric and reactive to eye opening and eye closing. Hyperventilation and photic stimulation were not performed.     IMPRESSION: This study is within normal limits. No seizures or epileptiform discharges were seen throughout the recording.  Karem Farha Barbra Sarks

## 2021-02-22 NOTE — Progress Notes (Addendum)
STROKE TEAM PROGRESS NOTE   INTERVAL HISTORY She reports that she is feeling well today. When asked what brought her into the hospital she reports she does not know. She reports that she was out on her porch and then she does not remember. She reports she does not remember being brought to the hospital, she "woke up" in the ED. She reports that her mother does have dementia and that her sister has made comments about her actions that are similar to their mothers. She denies any prior history of strokes, TIA or seizures.  She does admit that she has some baseline memory Hospitalist service is primary.  Vitals:   02/22/21 0545 02/22/21 0615 02/22/21 0630 02/22/21 0800  BP: (!) 128/52 (!) 126/57 (!) 129/52 (!) 117/51  Pulse: 79 77 77 76  Resp: 12 17 20 11   Temp:    98 F (36.7 C)  TempSrc:      SpO2: 95% 94% 91% 93%   CBC:  Recent Labs  Lab 02/21/21 2154 02/21/21 2157  WBC 5.9  --   NEUTROABS 3.3  --   HGB 11.4* 12.2  HCT 36.7 36.0  MCV 98.1  --   PLT 213  --    Basic Metabolic Panel:  Recent Labs  Lab 02/21/21 2154 02/21/21 2157  NA 143 142  K 4.2 4.1  CL 98 100  CO2 34*  --   GLUCOSE 100* 100*  BUN 19 25*  CREATININE 3.73* 3.80*  CALCIUM 9.4  --    Lipid Panel:  Recent Labs  Lab 02/22/21 0249  CHOL 191  TRIG 87  HDL 57  CHOLHDL 3.4  VLDL 17  LDLCALC 117*   HgbA1c:  Recent Labs  Lab 02/22/21 0249  HGBA1C 5.0   Urine Drug Screen: No results for input(s): LABOPIA, COCAINSCRNUR, LABBENZ, AMPHETMU, THCU, LABBARB in the last 168 hours.  Alcohol Level  Recent Labs  Lab 02/21/21 2154  ETH <10    IMAGING past 24 hours MR ANGIO HEAD WO CONTRAST  Result Date: 02/22/2021 CLINICAL DATA:  Slurred speech and abnormal gait EXAM: MRI HEAD WITHOUT CONTRAST MRA HEAD WITHOUT CONTRAST TECHNIQUE: Multiplanar, multiecho pulse sequences of the brain and surrounding structures were obtained without intravenous contrast. Angiographic images of the head were obtained using  MRA technique without contrast. COMPARISON:  None. FINDINGS: MRI HEAD FINDINGS Brain: No acute infarct, mass effect or extra-axial collection. Numerous chronic microhemorrhages supratentorially and infratentorially. Distribution is unchanged. Hyperintense T2-weighted signal is moderately widespread throughout the white matter. Generalized volume loss without a clear lobar predilection. The midline structures are normal. Vascular: Major flow voids are preserved. Skull and upper cervical spine: Normal calvarium and skull base. Visualized upper cervical spine and soft tissues are normal. Sinuses/Orbits:No paranasal sinus fluid levels or advanced mucosal thickening. No mastoid or middle ear effusion. Normal orbits. MRA HEAD FINDINGS POSTERIOR CIRCULATION: --Vertebral arteries: Normal --Inferior cerebellar arteries: Normal. --Basilar artery: Normal. --Superior cerebellar arteries: Normal. --Posterior cerebral arteries: Mild left P1-2 junction stenosis. ANTERIOR CIRCULATION: --Intracranial internal carotid arteries: Normal. --Anterior cerebral arteries (ACA): Normal. --Middle cerebral arteries (MCA): Normal. ANATOMIC VARIANTS: None IMPRESSION: 1. No acute intracranial abnormality. 2. Unchanged appearance of multiple chronic microhemorrhages and chronic small vessel disease. 3. Mild left P1-2 junction stenosis.  Otherwise normal MRA. Electronically Signed   By: Ulyses Jarred M.D.   On: 02/22/2021 00:51   MR Brain Wo Contrast (neuro protocol)  Result Date: 02/22/2021 CLINICAL DATA:  Slurred speech and abnormal gait EXAM: MRI HEAD WITHOUT CONTRAST  MRA HEAD WITHOUT CONTRAST TECHNIQUE: Multiplanar, multiecho pulse sequences of the brain and surrounding structures were obtained without intravenous contrast. Angiographic images of the head were obtained using MRA technique without contrast. COMPARISON:  None. FINDINGS: MRI HEAD FINDINGS Brain: No acute infarct, mass effect or extra-axial collection. Numerous chronic  microhemorrhages supratentorially and infratentorially. Distribution is unchanged. Hyperintense T2-weighted signal is moderately widespread throughout the white matter. Generalized volume loss without a clear lobar predilection. The midline structures are normal. Vascular: Major flow voids are preserved. Skull and upper cervical spine: Normal calvarium and skull base. Visualized upper cervical spine and soft tissues are normal. Sinuses/Orbits:No paranasal sinus fluid levels or advanced mucosal thickening. No mastoid or middle ear effusion. Normal orbits. MRA HEAD FINDINGS POSTERIOR CIRCULATION: --Vertebral arteries: Normal --Inferior cerebellar arteries: Normal. --Basilar artery: Normal. --Superior cerebellar arteries: Normal. --Posterior cerebral arteries: Mild left P1-2 junction stenosis. ANTERIOR CIRCULATION: --Intracranial internal carotid arteries: Normal. --Anterior cerebral arteries (ACA): Normal. --Middle cerebral arteries (MCA): Normal. ANATOMIC VARIANTS: None IMPRESSION: 1. No acute intracranial abnormality. 2. Unchanged appearance of multiple chronic microhemorrhages and chronic small vessel disease. 3. Mild left P1-2 junction stenosis.  Otherwise normal MRA. Electronically Signed   By: Ulyses Jarred M.D.   On: 02/22/2021 00:51   CT HEAD CODE STROKE WO CONTRAST  Result Date: 02/21/2021 CLINICAL DATA:  Code stroke.  Slurred speech EXAM: CT HEAD WITHOUT CONTRAST TECHNIQUE: Contiguous axial images were obtained from the base of the skull through the vertex without intravenous contrast. COMPARISON:  Head CT 05/13/2018 FINDINGS: Brain: There is no mass, hemorrhage or extra-axial collection. There is generalized atrophy without lobar predilection. There is hypoattenuation of the periventricular white matter, most commonly indicating chronic ischemic microangiopathy. Vascular: Atherosclerotic calcification of the vertebral and internal carotid arteries at the skull base. No abnormal hyperdensity of the major  intracranial arteries or dural venous sinuses. Skull: The visualized skull base, calvarium and extracranial soft tissues are normal. Upper cervical spinal canal appears narrowed. Sinuses/Orbits: No fluid levels or advanced mucosal thickening of the visualized paranasal sinuses. No mastoid or middle ear effusion. The orbits are normal. ASPECTS [facility] Stroke Program Early CT Score) - Ganglionic level infarction (caudate, lentiform nuclei, internal capsule, insula, M1-M3 cortex): 7 - Supraganglionic infarction (M4-M6 cortex): 3 Total score (0-10 with 10 being normal): 10 IMPRESSION: 1. No acute intracranial abnormality. 2. ASPECTS is 10. 3. 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 These results were communicated to Dr. Kerney Elbe at 10:12 pm on 02/21/2021 by text page via the [facility] messaging system. Electronically Signed   By: Ulyses Jarred M.D.   On: 02/21/2021 22:12    PHYSICAL EXAM Blood pressure (!) 117/51, pulse 76, temperature 98 F (36.7 C), resp. rate 11, SpO2 93 %.  General: alert and awake, elderly african [place] female, no apparent distress  Lungs: Symmetrical Chest rise, no labored breathing  Cardio: Regular Rate and Rhythm  Abdomen: Soft, non-tender  Neuro: Alert, oriented, thought content appropriate.  Speech fluent without evidence of aphasia.  Able to follow all commands without difficulty.  Diminished attention, registration and recall.  Clock drawing 2/4.  Able to name only 3 animals which can walk on 4 legs.  Recall 0/3. Cranial Nerves: II:  Visual fields grossly normal, pupils equal, round, reactive to light and accommodation III,IV, VI: ptosis not present, extra-ocular motions intact bilaterally V,VII: smile symmetric, facial light touch sensation normal bilaterally VIII: hearing normal bilaterally IX,X: uvula rises symmetrically XI: bilateral shoulder shrug XII: midline tongue extension without atrophy or fasciculations  Motor: Right : Upper extremity    4+/5    Left:     Upper extremity   4+/5  Lower extremity   4+/5    Lower extremity   4+/5 Tone and bulk:normal tone throughout; no atrophy noted Sensory: light touch intact throughout, bilaterally Cerebellar: normal finger-to-nose,  normal heel-to-shin test Gait: deferred    ASSESSMENT/PLAN Ms. ZHARIA CONROW is a 70 y.o. female with history of multiple medical conditions including ESRD on HD presenting via EMS from home as a Code Stroke with acute onset of left facial droop, confusion and slurred speech. LKN was 8:00 PM. She received dialysis today and arrived back home at 6:00 PM without any deficits at that time. 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 She has an MRI from 2016 which documents chronic cerebral microhemorrhages.   Given her image results and history stroke appears less likely. She reports she has had some similar episodes in the past but this was the most severe. Will order EEG to monitor for seizure. During interview she was unable to draw a clock, recall 3 items, or name more than 3 animals. She does appear to have dementia, will need to have outpatient follow up for this once discharged.  Seizure vs TIA: Unchanged appearance of multiple chronic microhemorrhages and chronic small vessel disease on MRI without evidence of new stroke  Code Stroke CT head No acute intracranial abnormality. ASPECTS 10.     MRA - No acute intracranial abnormality. Unchanged appearance of multiple chronic microhemorrhages and chronic small vessel disease. Mild left P1-2 junction stenosis.  Otherwise normal MRA.  Carotid Doppler - Right and Left Carotid: The extracranial vessels were near-normal with only minimal wall thickening or plaque.  2D Echo- Ordered  LDL 117  HgbA1c 5.0  VTE prophylaxis - SCD's    Diet   Diet Heart Room service appropriate? Yes; Fluid consistency: Thin     aspirin 81  mg daily prior to admission, now on aspirin 81 mg daily.   Therapy recommendations:  SNF  Disposition:  Awaiting medical clearance  Hypertension  Home meds: Carvedilol, Losartan,  Stable . Long-term BP goal normotensive  Hyperlipidemia  Home meds:  Atorvastatin, resumed in hospital  LDL 117, goal < 70  Increase to 80 mg daily   High intensity statin Atorvastatin  Continue statin at discharge   Other Stroke Risk Factors  Advanced Age >/= 71   Former Cigarette smoker  Hx stroke/TIA  Coronary artery disease  Congestive heart failure  Other Active Problems  ESRD  Hospital day # 1  Fatima Sanger MD Resident  I have personally obtained history,examined this patient, reviewed notes, independently viewed imaging studies, participated in medical decision making and plan of care.ROS completed by me personally and pertinent positives fully documented  I have made any additions or clarifications directly to the above note. Agree with note above. . Patient presented with episode of confusion and memory difficulties.  She apparently had some slurred speech and facial droop as well which she does not remember.  MRI is negative for acute stroke.  Recommend we check EEG to look for seizure activity.  For echocardiogram and carotid ultrasound results.  And aggressive risk factor modification.  No family available at the bedside for discussion.  Greater than 50% time during this 35-minute visit was spent on counseling and coordination of care about her episode  of seizure versus TIA Antony Contras, MD Medical Director [facility] Pager: [phone] 02/22/2021 2:25 PM   To contact Stroke Continuity provider, please refer to [email or web address] After hours, contact General Neurology

## 2021-02-22 NOTE — Progress Notes (Signed)
PT Cancellation Note  Patient Details Name: [name] MRN: [id number] DOB: 02-Mar-1951   Cancelled Treatment:    Reason Eval/Treat Not Completed: Active bedrest order currently has active bedrest order specifying HOB 0-30 degrees. Discussing with attending MD, however will need clearance and bedrest order removed to start PT eval.    Ann Lions PT, DPT, PN1   Supplemental Physical Therapist [place]    Pager [phone] Acute Rehab Office [phone]

## 2021-02-22 NOTE — Progress Notes (Signed)
VASCULAR LAB    Carotid duplex has been performed.  See CV proc for preliminary results.   KANADY, CANDACE, RVT 02/22/2021, 10:25 AM

## 2021-02-22 NOTE — Progress Notes (Signed)
EEG complete - results pending 

## 2021-02-22 NOTE — ED Notes (Signed)
Patient transported to vascular. 

## 2021-02-22 NOTE — ED Notes (Signed)
Patient A/A/O, no distress. Remains on continuous cardiac and 02 sat monitoring, denies pain/doiscomfort.

## 2021-02-23 ENCOUNTER — Inpatient Hospital Stay (HOSPITAL_COMMUNITY): Payer: Medicare HMO

## 2021-02-23 DIAGNOSIS — G459 Transient cerebral ischemic attack, unspecified: Secondary | ICD-10-CM | POA: Diagnosis not present

## 2021-02-23 DIAGNOSIS — F331 Major depressive disorder, recurrent, moderate: Secondary | ICD-10-CM

## 2021-02-23 DIAGNOSIS — I6389 Other cerebral infarction: Secondary | ICD-10-CM

## 2021-02-23 LAB — ECHOCARDIOGRAM LIMITED
Height: 62 in
Weight: 1904 oz

## 2021-02-23 LAB — BASIC METABOLIC PANEL
Anion gap: 16 — ABNORMAL HIGH (ref 5–15)
BUN: 51 mg/dL — ABNORMAL HIGH (ref 8–23)
CO2: 24 mmol/L (ref 22–32)
Calcium: 9 mg/dL (ref 8.9–10.3)
Chloride: 95 mmol/L — ABNORMAL LOW (ref 98–111)
Creatinine, Ser: 6.52 mg/dL — ABNORMAL HIGH (ref 0.44–1.00)
GFR, Estimated: 6 mL/min — ABNORMAL LOW (ref >60)
Glucose, Bld: 75 mg/dL (ref 70–99)
Potassium: 5.8 mmol/L — ABNORMAL HIGH (ref 3.5–5.1)
Sodium: 135 mmol/L (ref 135–145)

## 2021-02-23 MED ORDER — PERFLUTREN LIPID MICROSPHERE
1.0000 mL | INTRAVENOUS | Status: AC | PRN
  Administered 2021-02-23: 2 mL via INTRAVENOUS
  Filled 2021-02-23: qty 10

## 2021-02-23 MED ORDER — ONDANSETRON HCL 4 MG/2ML IJ SOLN
4.0000 mg | Freq: Once | INTRAMUSCULAR | Status: AC
  Administered 2021-02-23: 4 mg via INTRAVENOUS
  Filled 2021-02-23: qty 2

## 2021-02-23 MED ORDER — SODIUM ZIRCONIUM CYCLOSILICATE 10 G PO PACK
10.0000 g | PACK | Freq: Once | ORAL | Status: AC
  Administered 2021-02-23: 10 g via ORAL
  Filled 2021-02-23: qty 1

## 2021-02-23 MED ORDER — BUSPIRONE HCL 10 MG PO TABS
10.0000 mg | ORAL_TABLET | Freq: Two times a day (BID) | ORAL | Status: DC
  Administered 2021-02-23 – 2021-02-25 (×5): 10 mg via ORAL
  Filled 2021-02-23 (×5): qty 1

## 2021-02-23 NOTE — Consult Note (Addendum)
[facility] Renal Consultation Note    Indication for Consultation:  Management of ESRD/hemodialysis; anemia, hypertension/volume and secondary hyperparathyroidism  HPI: [name] is a 70 y.o. female with ESRD on HD MWF, HTN, HLD, CAD, piror CVA. Now admitted following TIA.  Presented to ED from home Friday evening with left facial droop and slurred speech. Code stroke on admission. CT head, MRI negative for acute CVA. Neuro consulted/following.  Labs this am notable for mild hyperkalemia.  Dialysis MWF at [facility]. She is compliant with dialysis treatments. Last dialysis was Friday 3/11.   Seen and examined at bedside. She is alert, oriented. Feels better than when she arrived. Denies cp, sob, n/v, edema.   Past Medical History:  Diagnosis Date  . Adenomatous polyp of colon 10/2010, 2006, 2015  . Anemia in CKD (chronic kidney disease) 11/07/2012   s/p blood transfusion.   . Arthritis   . CAD (coronary artery disease)    "something like that"  . CHF (congestive heart failure) ([place])   . Constipation   . Depression with anxiety   . Diverticula, colon   . 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  Had some early rejection then stable function for years, then had slow decline of function and went back on hemodialysis in 2012.  Gets HD TTS schedule at [facility] on [facility] still using L forearm AVF.     Marland Kitchen GERD (gastroesophageal reflux disease)   . GI bleed 2017   felt to be ischemic colitis, last colo 2015  . Headache   . Hyperlipidemia   . Hypertension   . Neurologic gait dysfunction   . Neuromuscular disorder (HCC)    neuropathy hand and legs  . Osteoporosis   . Pneumonia   . 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 Pulmonary edema 12/2019  . Stroke [facility]) 11/2015   TIA  . Weight loss, unintentional    Past Surgical  History:  Procedure Laterality Date  . AV FISTULA PLACEMENT     for dialysis  . AV FISTULA PLACEMENT Left 11/22/2015   Procedure: ARTERIOVENOUS (AV) FISTULA CREATION-LEFT BRACHIOCEPHALIC;  Surgeon: Serafina Mitchell, MD;  Location: [place];  Service: Vascular;  Laterality: Left;  . AV FISTULA PLACEMENT Right 03/15/2020   Procedure: INSERTION OF ARTERIOVENOUS (AV) GORE-TEX GRAFT ARM ( BRACHIAL AXILLARY );  Surgeon: Katha Cabal, MD;  Location: [facility];  Service: Vascular;  Laterality: Right;  . BACK SURGERY    . CERVICAL FUSION    . CHOLECYSTECTOMY  12/02/2012   Procedure: LAPAROSCOPIC CHOLECYSTECTOMY WITH INTRAOPERATIVE CHOLANGIOGRAM;  Surgeon: Edward Jolly, MD;  Location: [place];  Service: General;  Laterality: N/A;  . EYE SURGERY Bilateral    cataract surgery  . EYE SURGERY Left 2019   laser  . HEMATOMA EVACUATION Left 12/24/2016   Procedure: EVACUATION HEMATOMA LEFT UPPER ARM;  Surgeon: Waynetta Sandy, MD;  Location: [place];  Service: Vascular;  Laterality: Left;  . I & D EXTREMITY Left 12/31/2016   Procedure: IRRIGATION AND DEBRIDEMENT EXTREMITY;  Surgeon: Angelia Mould, MD;  Location: [place];  Service: Vascular;  Laterality: Left;  . INSERTION OF DIALYSIS CATHETER Right 12/24/2016   Procedure: INSERTION OF DIALYSIS CATHETER;  Surgeon: Waynetta Sandy, MD;  Location: [place];  Service: Vascular;  Laterality: Right;  . INSERTION OF DIALYSIS CATHETER Right 02/04/2017  Procedure: INSERTION OF DIALYSIS CATHETER;  Surgeon: Waynetta Sandy, MD;  Location: [place];  Service: Vascular;  Laterality: Right;  . KIDNEY TRANSPLANT  1996  . PERIPHERAL VASCULAR CATHETERIZATION Left 10/23/2016   Procedure: Fistulagram;  Surgeon: Elam Dutch, MD;  Location: [place] CV LAB;  Service: Cardiovascular;  Laterality: Left;  . PERIPHERAL VASCULAR THROMBECTOMY Right 04/16/2020   Procedure: PERIPHERAL VASCULAR THROMBECTOMY;  Surgeon: Katha Cabal, MD;  Location:  [place] CV LAB;  Service: Cardiovascular;  Laterality: Right;  . RESECTION OF ARTERIOVENOUS FISTULA ANEURYSM Left 11/22/2015   Procedure: RESECTION OF LEFT RADIOCEPHALIC FISTULA ANEURYSM ;  Surgeon: Serafina Mitchell, MD;  Location: [place];  Service: Vascular;  Laterality: Left;  . REVISON OF ARTERIOVENOUS FISTULA Left 12/22/2016   Procedure: REVISON OF LEFT ARTERIOVENOUS FISTULA;  Surgeon: Waynetta Sandy, MD;  Location: [place];  Service: Vascular;  Laterality: Left;  . REVISON OF ARTERIOVENOUS FISTULA Left 02/04/2017   Procedure: REVISON OF LEFT UPPER ARM ARTERIOVENOUS FISTULA;  Surgeon: Waynetta Sandy, MD;  Location: [place];  Service: Vascular;  Laterality: Left;  . UPPER EXTREMITY ANGIOGRAPHY Bilateral 09/19/2019   Procedure: UPPER EXTREMITY ANGIOGRAPHY;  Surgeon: Katha Cabal, MD;  Location: [place] CV LAB;  Service: Cardiovascular;  Laterality: Bilateral;   Family History  Problem Relation Age of Onset  . Colon cancer Brother   . Cancer Brother   . Coronary artery disease Mother 32  . Hyperlipidemia Mother   . Hypertension Mother   . Stroke Maternal Aunt   . Esophageal cancer Neg Hx   . Stomach cancer Neg Hx   . Rectal cancer Neg Hx    Social History:  reports that she quit smoking about 29 years ago. Her smoking use included cigarettes. She has never used smokeless tobacco. She reports that she does not drink alcohol and does not use drugs. Allergies  Allergen Reactions  . Sulfa Antibiotics Other (See Comments)    Per patient, both parents allergic-so will not take  . Adhesive [Tape] Itching   Prior to Admission medications   Medication Sig Start Date End Date Taking? Authorizing Provider  acetaminophen (TYLENOL) 500 MG tablet Take 1 tablet (500 mg total) by mouth every 6 (six) hours as needed. 09/05/18  Yes Law, Bea Graff, PA-C  allopurinol (ZYLOPRIM) 300 MG tablet Take 150 mg by mouth daily.    Yes [provider]  ALPRAZolam (XANAX)  0.25 MG tablet Take 0.25 mg by mouth daily as needed for anxiety.    Yes [provider]  aspirin EC 81 MG EC tablet Take 1 tablet (81 mg total) by mouth daily. 05/14/20  Yes Black, Lezlie Octave, NP  atorvastatin (LIPITOR) 40 MG tablet Take 1 tablet (40 mg total) by mouth daily. 04/02/20 07/05/29 Yes Amin, Ankit Chirag, MD  AURYXIA 1 GM 210 MG(Fe) tablet Take 420 mg by mouth 3 (three) times daily with meals.    Yes [provider]  B Complex-C-Folic Acid (DIALYVITE 774) 0.8 MG WAFR Take 1 tablet by mouth daily.  10/25/18  Yes [provider]  carvedilol (COREG) 6.25 MG tablet Take 6.25 mg by mouth 2 (two) times daily. 08/28/20  Yes [provider]  cinacalcet (SENSIPAR) 30 MG tablet Take 30 mg by mouth daily. 01/04/20  Yes [provider]  lactulose (CHRONULAC) 10 GM/15ML solution Take 20 g by mouth daily as needed for mild constipation.   Yes [provider]  losartan (COZAAR) 50 MG tablet Take 50 mg  by mouth daily. 09/25/20  Yes [provider]  ondansetron (ZOFRAN) 8 MG tablet Take 8 mg by mouth 2 (two) times daily as needed for nausea or vomiting.    Yes [provider]  zolpidem (AMBIEN) 10 MG tablet Take 10 mg by mouth at bedtime as needed. 02/13/21  Yes [provider]   Current Facility-Administered Medications  Medication Dose Route Frequency Provider Last Rate Last Admin  .  stroke: mapping our early stages of recovery book   Does not apply Once Chotiner, Yevonne Aline, MD      . acetaminophen (TYLENOL) tablet 650 mg  650 mg Oral Q4H PRN Chotiner, Yevonne Aline, MD   650 mg at 02/22/21 1141   Or  . acetaminophen (TYLENOL) 160 MG/5ML solution 650 mg  650 mg Per Tube Q4H PRN Chotiner, Yevonne Aline, MD       Or  . acetaminophen (TYLENOL) suppository 650 mg  650 mg Rectal Q4H PRN Chotiner, Yevonne Aline, MD      . ALPRAZolam Duanne Moron) tablet 0.25 mg  0.25 mg Oral Daily PRN Chotiner, Yevonne Aline, MD   0.25 mg at 02/22/21 0439  . aspirin EC  tablet 81 mg  81 mg Oral Daily Chotiner, Yevonne Aline, MD   81 mg at 02/23/21 1024  . atorvastatin (LIPITOR) tablet 40 mg  40 mg Oral Daily Chotiner, Yevonne Aline, MD   40 mg at 02/23/21 1024  . cinacalcet (SENSIPAR) tablet 30 mg  30 mg Oral Q breakfast Chotiner, Yevonne Aline, MD   30 mg at 02/23/21 1024  . multivitamin (RENA-VIT) tablet 1 tablet  1 tablet Oral QHS Chotiner, Yevonne Aline, MD   1 tablet at 02/22/21 2155     ROS: As per HPI otherwise negative.  Physical Exam: Vitals:   02/22/21 1951 02/22/21 2257 02/23/21 0358 02/23/21 0755  BP: 124/67 (!) 122/46 (!) 108/55 (!) 103/57  Pulse: 67 72 74 71  Resp: 18 18 18 16   Temp: 97.9 F (36.6 C) 98.3 F (36.8 C) 98 F (36.7 C) 97.9 F (36.6 C)  TempSrc: Oral Oral Oral Oral  SpO2: 99% 100% 96% 95%  Weight:      Height:         General: Frail appearing, nad  Head: NCAT sclera not icteric Neck: Supple. No JVD appreciated  Lungs: Clear bilaterally, no rales, wheeze  Heart: RRR with S1 S2 Abdomen: soft non-tender  Lower extremities:without edema or ischemic changes, no open wounds  Neuro: A & O  X 3. Moves all extremities spontaneously. Psych:  Responds to questions appropriately with a normal affect. Dialysis Access: RUE AVG +bruit   Labs: Basic Metabolic Panel: Recent Labs  Lab 02/21/21 2154 02/21/21 2157 02/23/21 0406  NA 143 142 135  K 4.2 4.1 5.8*  CL 98 100 95*  CO2 34*  --  24  GLUCOSE 100* 100* 75  BUN 19 25* 51*  CREATININE 3.73* 3.80* 6.52*  CALCIUM 9.4  --  9.0   Liver Function Tests: Recent Labs  Lab 02/21/21 2154  AST 22  ALT 10  ALKPHOS 110  BILITOT 0.8  PROT 7.3  ALBUMIN 3.5   No results for input(s): LIPASE, AMYLASE in the last 168 hours. No results for input(s): AMMONIA in the last 168 hours. CBC: Recent Labs  Lab 02/21/21 2154 02/21/21 2157  WBC 5.9  --   NEUTROABS 3.3  --   HGB 11.4* 12.2  HCT 36.7 36.0  MCV 98.1  --   PLT 213  --  Cardiac Enzymes: No results for input(s): CKTOTAL,  CKMB, CKMBINDEX, TROPONINI in the last 168 hours. CBG: Recent Labs  Lab 02/21/21 2155  GLUCAP 84   Iron Studies: No results for input(s): IRON, TIBC, TRANSFERRIN, FERRITIN in the last 72 hours. Studies/Results: MR ANGIO HEAD WO CONTRAST  Result Date: 02/22/2021 CLINICAL DATA:  Slurred speech and abnormal gait EXAM: MRI HEAD WITHOUT CONTRAST MRA HEAD WITHOUT CONTRAST TECHNIQUE: Multiplanar, multiecho pulse sequences of the brain and surrounding structures were obtained without intravenous contrast. Angiographic images of the head were obtained using MRA technique without contrast. COMPARISON:  None. FINDINGS: MRI HEAD FINDINGS Brain: No acute infarct, mass effect or extra-axial collection. Numerous chronic microhemorrhages supratentorially and infratentorially. Distribution is unchanged. Hyperintense T2-weighted signal is moderately widespread throughout the white matter. Generalized volume loss without a clear lobar predilection. The midline structures are normal. Vascular: Major flow voids are preserved. Skull and upper cervical spine: Normal calvarium and skull base. Visualized upper cervical spine and soft tissues are normal. Sinuses/Orbits:No paranasal sinus fluid levels or advanced mucosal thickening. No mastoid or middle ear effusion. Normal orbits. MRA HEAD FINDINGS POSTERIOR CIRCULATION: --Vertebral arteries: Normal --Inferior cerebellar arteries: Normal. --Basilar artery: Normal. --Superior cerebellar arteries: Normal. --Posterior cerebral arteries: Mild left P1-2 junction stenosis. ANTERIOR CIRCULATION: --Intracranial internal carotid arteries: Normal. --Anterior cerebral arteries (ACA): Normal. --Middle cerebral arteries (MCA): Normal. ANATOMIC VARIANTS: None IMPRESSION: 1. No acute intracranial abnormality. 2. Unchanged appearance of multiple chronic microhemorrhages and chronic small vessel disease. 3. Mild left P1-2 junction stenosis.  Otherwise normal MRA. Electronically Signed   By: Ulyses Jarred M.D.   On: 02/22/2021 00:51   MR Brain Wo Contrast (neuro protocol)  Result Date: 02/22/2021 CLINICAL DATA:  Slurred speech and abnormal gait EXAM: MRI HEAD WITHOUT CONTRAST MRA HEAD WITHOUT CONTRAST TECHNIQUE: Multiplanar, multiecho pulse sequences of the brain and surrounding structures were obtained without intravenous contrast. Angiographic images of the head were obtained using MRA technique without contrast. COMPARISON:  None. FINDINGS: MRI HEAD FINDINGS Brain: No acute infarct, mass effect or extra-axial collection. Numerous chronic microhemorrhages supratentorially and infratentorially. Distribution is unchanged. Hyperintense T2-weighted signal is moderately widespread throughout the white matter. Generalized volume loss without a clear lobar predilection. The midline structures are normal. Vascular: Major flow voids are preserved. Skull and upper cervical spine: Normal calvarium and skull base. Visualized upper cervical spine and soft tissues are normal. Sinuses/Orbits:No paranasal sinus fluid levels or advanced mucosal thickening. No mastoid or middle ear effusion. Normal orbits. MRA HEAD FINDINGS POSTERIOR CIRCULATION: --Vertebral arteries: Normal --Inferior cerebellar arteries: Normal. --Basilar artery: Normal. --Superior cerebellar arteries: Normal. --Posterior cerebral arteries: Mild left P1-2 junction stenosis. ANTERIOR CIRCULATION: --Intracranial internal carotid arteries: Normal. --Anterior cerebral arteries (ACA): Normal. --Middle cerebral arteries (MCA): Normal. ANATOMIC VARIANTS: None IMPRESSION: 1. No acute intracranial abnormality. 2. Unchanged appearance of multiple chronic microhemorrhages and chronic small vessel disease. 3. Mild left P1-2 junction stenosis.  Otherwise normal MRA. Electronically Signed   By: Ulyses Jarred M.D.   On: 02/22/2021 00:51   EEG adult  Result Date: 02/22/2021 Lora Havens, MD     02/22/2021  5:23 PM Patient Name: LINSIE LUPO MRN: [id number]  Epilepsy Attending: Lora Havens Referring Physician/Provider: Dr Lestine Mount Date: 02/22/2021 Duration: 27.38 mins Patient history:  70 y.o. 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 EEG to evaluate for seizure Level of alertness: Awake AEDs during EEG study: None Technical aspects:  This EEG study was done with scalp electrodes positioned according to the 10-20 International system of electrode placement. Electrical activity was acquired at a sampling rate of 500Hz  and reviewed with a high frequency filter of 70Hz  and a low frequency filter of 1Hz . EEG data were recorded continuously and digitally stored. Description: The posterior dominant rhythm consists of 8-9 Hz activity of moderate voltage (25-35 uV) seen predominantly in posterior head regions, symmetric and reactive to eye opening and eye closing. Hyperventilation and photic stimulation were not performed.   IMPRESSION: This study is within normal limits. No seizures or epileptiform discharges were seen throughout the recording. Lora Havens   ECHOCARDIOGRAM COMPLETE  Result Date: 02/22/2021    ECHOCARDIOGRAM REPORT   Patient Name:   JANIECE SCOVILL Date of Exam: 02/22/2021 Medical Rec #:  [id number]         Height:       62.0 in Accession #:    [id number]        Weight:       115.8 lb Date of Birth:  1951/10/25         BSA:          1.516 m Patient Age:    64 years          BP:           117/51 mmHg Patient Gender: F                 HR:           74 bpm. Exam Location:  Inpatient Procedure: 2D Echo Indications:    stroke  History:        Patient has prior history of Echocardiogram examinations, most                 recent 05/14/2020. End stage renal disese and Stroke; Risk                 Factors:Hypertension and Dyslipidemia.  Sonographer:    Johny Chess Referring Phys: [id number] [place]  1. No LV thrombus  noted on this non-contrast study. Would recommend limited contrast study given anteroapical/apex hypokinesis. Grade 2 DD is present. Moderately elevated RVSP.  2. Left ventricular ejection fraction, by estimation, is 60 to 65%. The left ventricle has normal function. The left ventricle demonstrates regional wall motion abnormalities (see scoring diagram/findings for description). Left ventricular diastolic parameters are consistent with Grade II diastolic dysfunction (pseudonormalization). Elevated left atrial pressure.  3. Right ventricular systolic function is normal. The right ventricular size is moderately enlarged. There is moderately elevated pulmonary artery systolic pressure. The estimated right ventricular systolic pressure is 71.6 mmHg.  4. Left atrial size was moderately dilated.  5. The mitral valve is degenerative. Mild mitral valve regurgitation. No evidence of mitral stenosis.  6. Tricuspid valve regurgitation is moderate.  7. The aortic valve is tricuspid. There is mild calcification of the aortic valve. There is mild thickening of the aortic valve. Aortic valve regurgitation is not visualized. Mild aortic valve sclerosis is present, with no evidence of aortic valve stenosis.  8. The inferior vena cava is normal in size with greater than 50% respiratory variability, suggesting right atrial pressure of 3 mmHg. Comparison(s): No prior Echocardiogram. FINDINGS  Left Ventricle: Left ventricular ejection fraction, by estimation, is 60 to 65%. The left ventricle has normal function. The left ventricle demonstrates regional wall motion abnormalities. The left ventricular internal cavity size was normal in size. There is  no left ventricular hypertrophy. Left ventricular diastolic parameters are consistent with Grade II diastolic dysfunction (pseudonormalization). Elevated left atrial pressure.  LV Wall Scoring: The apical anterior segment and apex are hypokinetic. Right Ventricle: The right ventricular size  is moderately enlarged. No increase in right ventricular wall thickness. Right ventricular systolic function is normal. There is moderately elevated pulmonary artery systolic pressure. The tricuspid regurgitant  velocity is 3.85 m/s, and with an assumed right atrial pressure of 3 mmHg, the estimated right ventricular systolic pressure is 01.7 mmHg. Left Atrium: Left atrial size was moderately dilated. Right Atrium: Right atrial size was normal in size. Pericardium: Trivial pericardial effusion is present. Mitral Valve: The mitral valve is degenerative in appearance. Mild mitral annular calcification. Mild mitral valve regurgitation. No evidence of mitral valve stenosis. Tricuspid Valve: The tricuspid valve is grossly normal. Tricuspid valve regurgitation is moderate . No evidence of tricuspid stenosis. Aortic Valve: The aortic valve is tricuspid. There is mild calcification of the aortic valve. There is mild thickening of the aortic valve. Aortic valve regurgitation is not visualized. Mild aortic valve sclerosis is present, with no evidence of aortic valve stenosis. Aortic valve mean gradient measures 5.9 mmHg. Aortic valve peak gradient measures 12.6 mmHg. Aortic valve area, by VTI measures 2.35 cm. Pulmonic Valve: The pulmonic valve was grossly normal. Pulmonic valve regurgitation is not visualized. No evidence of pulmonic stenosis. Aorta: The aortic root and ascending aorta are structurally normal, with no evidence of dilitation. Venous: The inferior vena cava is normal in size with greater than 50% respiratory variability, suggesting right atrial pressure of 3 mmHg. IAS/Shunts: The atrial septum is grossly normal.  LEFT VENTRICLE PLAX 2D LVIDd:         3.90 cm     Diastology LVIDs:         2.50 cm     LV e' medial:    5.98 cm/s LV PW:         0.90 cm     LV E/e' medial:  27.8 LV IVS:        1.00 cm     LV e' lateral:   8.05 cm/s LVOT diam:     1.90 cm     LV E/e' lateral: 20.6 LV SV:         83 LV SV Index:    55 LVOT Area:     2.84 cm  LV Volumes (MOD) LV vol d, MOD A2C: 76.9 ml LV vol s, MOD A2C: 28.1 ml LV SV MOD A2C:     48.8 ml RIGHT VENTRICLE             IVC RV S prime:     11.00 cm/s  IVC diam: 1.70 cm TAPSE (M-mode): 2.2 cm LEFT ATRIUM             Index       RIGHT ATRIUM           Index LA diam:        3.80 cm 2.51 cm/m  RA Area:     17.60 cm LA Vol (A2C):   69.0 ml 45.53 ml/m RA Volume:   45.70 ml  30.15 ml/m LA Vol (A4C):   66.1 ml 43.62 ml/m LA Biplane Vol: 69.9 ml 46.12 ml/m  AORTIC VALVE AV Area (Vmax):    2.43 cm AV Area (Vmean):   2.48 cm AV Area (VTI):     2.35 cm AV Vmax:  177.69 cm/s AV Vmean:          113.109 cm/s AV VTI:            0.355 m AV Peak Grad:      12.6 mmHg AV Mean Grad:      5.9 mmHg LVOT Vmax:         152.00 cm/s LVOT Vmean:        98.800 cm/s LVOT VTI:          0.294 m LVOT/AV VTI ratio: 0.83  AORTA Ao Root diam: 3.00 cm Ao Asc diam:  3.80 cm MITRAL VALVE                 TRICUSPID VALVE MV Area (PHT): 4.21 cm      TR Peak grad:   59.3 mmHg MV Decel Time: 180 msec      TR Vmax:        385.00 cm/s MR Peak grad:    123.7 mmHg MR Mean grad:    96.0 mmHg   SHUNTS MR Vmax:         556.00 cm/s Systemic VTI:  0.29 m MR Vmean:        476.0 cm/s  Systemic Diam: 1.90 cm MR PISA:         1.01 cm MR PISA Eff ROA: 8 mm MR PISA Radius:  0.40 cm MV E velocity: 166.00 cm/s MV A velocity: 95.60 cm/s MV E/A ratio:  1.74 Eleonore Chiquito MD Electronically signed by Eleonore Chiquito MD Signature Date/Time: 02/22/2021/1:16:17 PM    Final    CT HEAD CODE STROKE WO CONTRAST  Result Date: 02/21/2021 CLINICAL DATA:  Code stroke.  Slurred speech EXAM: CT HEAD WITHOUT CONTRAST TECHNIQUE: Contiguous axial images were obtained from the base of the skull through the vertex without intravenous contrast. COMPARISON:  Head CT 05/13/2018 FINDINGS: Brain: There is no mass, hemorrhage or extra-axial collection. There is generalized atrophy without lobar predilection. There is hypoattenuation of the  periventricular white matter, most commonly indicating chronic ischemic microangiopathy. Vascular: Atherosclerotic calcification of the vertebral and internal carotid arteries at the skull base. No abnormal hyperdensity of the major intracranial arteries or dural venous sinuses. Skull: The visualized skull base, calvarium and extracranial soft tissues are normal. Upper cervical spinal canal appears narrowed. Sinuses/Orbits: No fluid levels or advanced mucosal thickening of the visualized paranasal sinuses. No mastoid or middle ear effusion. The orbits are normal. ASPECTS [facility] Stroke Program Early CT Score) - Ganglionic level infarction (caudate, lentiform nuclei, internal capsule, insula, M1-M3 cortex): 7 - Supraganglionic infarction (M4-M6 cortex): 3 Total score (0-10 with 10 being normal): 10 IMPRESSION: 1. No acute intracranial abnormality. 2. ASPECTS is 10. 3. 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 These results were communicated to Dr. Kerney Elbe at 10:12 pm on 02/21/2021 by text page via the [facility] messaging system. Electronically Signed   By: Ulyses Jarred M.D.   On: 02/21/2021 22:12   VAS US CAROTID (at [facility] and [facility] only)  Result Date: 02/22/2021 Carotid Arterial Duplex Study Indications:       Speech disturbance, Weakness and irregular gait. Risk Factors:      Hypertension, hyperlipidemia, past history of smoking,                    coronary artery disease. Other Factors:     ESRD on dialysis. Comparison Study:  Prior normal carotid duplex done 02/22/18 Performing Technologist: Sharion Dove RVS  Examination Guidelines: A complete evaluation includes B-mode  imaging, spectral Doppler, color Doppler, and power Doppler as needed of all accessible portions of each vessel. Bilateral testing is considered an integral part of a complete examination. Limited examinations for reoccurring indications may be performed as noted.  Right Carotid Findings:  +----------+--------+--------+--------+------------------+---------+           PSV cm/sEDV cm/sStenosisPlaque DescriptionComments  +----------+--------+--------+--------+------------------+---------+ CCA Prox  88      11                                          +----------+--------+--------+--------+------------------+---------+ CCA Distal75      6                                           +----------+--------+--------+--------+------------------+---------+ ICA Prox  55      8               heterogenous      Shadowing +----------+--------+--------+--------+------------------+---------+ ICA Distal70      13                                          +----------+--------+--------+--------+------------------+---------+ ECA       196     9               calcific                    +----------+--------+--------+--------+------------------+---------+ +----------+--------+-------+--------+-------------------+           PSV cm/sEDV cmsDescribeArm Pressure (mmHG) +----------+--------+-------+--------+-------------------+ [id number]                                        +----------+--------+-------+--------+-------------------+ +---------+--------+--+--------+--+ VertebralPSV cm/s95EDV cm/s20 +---------+--------+--+--------+--+  Left Carotid Findings: +----------+--------+--------+--------+------------------+------------------+           PSV cm/sEDV cm/sStenosisPlaque DescriptionComments           +----------+--------+--------+--------+------------------+------------------+ CCA Prox  103     8                                 intimal thickening +----------+--------+--------+--------+------------------+------------------+ CCA Distal69      8                                 intimal thickening +----------+--------+--------+--------+------------------+------------------+ ICA Prox  59      12              heterogenous                          +----------+--------+--------+--------+------------------+------------------+ ICA Distal60      15                                                   +----------+--------+--------+--------+------------------+------------------+ ECA       62      12                                                   +----------+--------+--------+--------+------------------+------------------+ +----------+--------+--------+--------+-------------------+  PSV cm/sEDV cm/sDescribeArm Pressure (mmHG) +----------+--------+--------+--------+-------------------+ Subclavian117                                         +----------+--------+--------+--------+-------------------+ +---------+--------+--+--------+-+ VertebralPSV cm/s44EDV cm/s5 +---------+--------+--+--------+-+   Summary: Right Carotid: The extracranial vessels were near-normal with only minimal wall                thickening or plaque. Left Carotid: The extracranial vessels were near-normal with only minimal wall               thickening or plaque. Vertebrals:  Bilateral vertebral arteries demonstrate antegrade flow. Subclavians: Normal flow hemodynamics were seen in bilateral subclavian              arteries. *See table(s) above for measurements and observations.  Electronically signed by Antony Contras MD on 02/22/2021 at 12:51:37 PM.    Final     Dialysis Orders:  [facility] MWF 4h 350/500 EDW 49kg 2K/2Ca UFP 2  AVG No heparin  Hectorol 2 TIW   Assessment/Plan: 1.  TIA - Hx prior CVA. No acute findings on MRI. EEG neg. SNF placement recommended.  Per neuro/primary  2.  ESRD -  HD MWF. Next HD Monday -1st shift in am.  K+ 5.8 - got Loklema 10 today.  3.  Hypertension/volume  -BP controlled. No gross volume on exam. UF to EDW as tolerated  4.  Anemia  - Hgb 12. No ESA needs  5.  Metabolic bone disease -  Continue VDRA, Sensipar,Auryixa binder.  6.  Nutrition - Renal diet with fluid restriction.   Lynnda Child PA-C [facility] 02/23/2021, 12:23 PM

## 2021-02-23 NOTE — NC FL2 (Signed)
[place] LEVEL OF CARE SCREENING TOOL     IDENTIFICATION  Patient Name: [name] Birthdate: [birth year] Sex: female Admission Date (Current Location): 02/21/2021  [facility] and [place] Number:  Herbalist and Address:  [facility] [place], [place] [address] [place], [place] [address]      Provider Number: [id number]  Attending Physician Name and Address:  Dessa Phi, DO  Relative Name and Phone Number:  Kathrynn Ducking, sister, [phone]    Current Level of Care: Hospital Recommended Level of Care: [place] Prior Approval Number:    Date Approved/Denied:   PASRR Number: [id number]  Discharge Plan: SNF    Current Diagnoses: Patient Active Problem List   Diagnosis Date Noted  . Major depressive disorder, recurrent episode, moderate ([place]) 02/23/2021  . TIA (transient ischemic attack) 02/21/2021  . Prolonged QT interval 02/21/2021  . NSTEMI (non-ST elevated myocardial infarction) ([place]) 05/14/2020  . Volume overload 05/13/2020  . Hypotension after procedure 03/16/2020  . Chest pain 03/16/2020  . Change in mental state 03/15/2020  . Ventricular tachycardia ([place]) 03/15/2020  . Seizure ([place]) 03/15/2020  . Unresponsive episode 03/15/2020  . Pulmonary edema 01/01/2020  . Complication of vascular access for dialysis 12/21/2019  . Breakdown (mechanical) of surgically created arteriovenous fistula, initial encounter ([place]) 12/18/2019  . Weakness 10/11/2019  . 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 Dependence on renal dialysis ([place]) 07/24/2019  . Iron deficiency anemia, unspecified 06/09/2019  . Age-related osteoporosis without current pathological fracture 04/17/2019  . 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 Coagulation defect, unspecified ([place]) 04/17/2019  . Diarrhea, unspecified 04/17/2019  . Encounter for screening for depression 04/17/2019  . Headache, unspecified  04/17/2019  . 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 Pain, unspecified 04/17/2019  . Polyp of colon 04/17/2019  . Primary generalized (osteo)arthritis 04/17/2019  . Pruritus, unspecified 04/17/2019  . Pure hypercholesterolemia, unspecified 04/17/2019  . Secondary hyperparathyroidism of renal origin ([place]) 04/17/2019  . Gastro-esophageal reflux disease without esophagitis 04/17/2019  . Essential (primary) hypertension 04/17/2019  . Hyperlipidemia, unspecified 04/17/2019  . Transient cerebral ischemic attack, unspecified 04/17/2019  . Hypoxemia 12/13/2018  . Atrial fibrillation ([place]) 03/23/2017  . Prolonged Q-T interval on ECG 02/24/2017  . Hypokalemia 02/24/2017  . Acute ischemic stroke ([place]) 02/23/2017  . Malnutrition of moderate degree 12/24/2016  . Problem with dialysis access ([place]) 12/21/2016  . Acute ischemic colitis ([place]) 05/16/2016  . Colitis 05/15/2016  . Rectal bleeding 05/15/2016  . Neurologic abnormality 11/19/2015  . Hyperkalemia 11/19/2015  . Anxiety 11/19/2015  . Insomnia 11/19/2015  . Gait instability   . Stroke-like symptom 11/16/2015  . Vestibular neuritis 11/16/2015  . Stroke (cerebrum) ([place]) 11/16/2015  . Dizziness 05/09/2015  . Ataxia 05/09/2015  . H/O: CVA (cerebrovascular accident) 05/09/2015  . Left facial numbness 05/09/2015  . Left leg numbness 05/09/2015  . Hyperlipidemia   . Acute on chronic diastolic heart failure ([place])   . Shortness of breath 04/01/2013  . HTN (hypertension) 04/01/2013  . Cholecystitis, acute 11/07/2012  . End-stage renal disease on hemodialysis ([place]) 11/07/2012  . Anemia in chronic kidney disease 11/07/2012    Orientation RESPIRATION BLADDER Height & Weight     Time,Self,Situation,Place  Normal Continent Weight: 119 lb (54 kg) Height:  5\' 2"  (157.5 cm)  BEHAVIORAL SYMPTOMS/MOOD NEUROLOGICAL BOWEL NUTRITION STATUS      Continent Diet (See DC summary)  AMBULATORY STATUS COMMUNICATION OF NEEDS Skin   Limited Assist  Verbally Normal                       Personal Care Assistance Level of Assistance  Bathing,Feeding,Dressing Bathing Assistance: Limited assistance Feeding assistance: Independent Dressing Assistance: Limited assistance     Functional Limitations Info  Sight,Hearing,Speech Sight Info: Adequate Hearing Info: Adequate Speech Info: Adequate    SPECIAL CARE FACTORS FREQUENCY  PT (By licensed PT),OT (By licensed OT)     PT Frequency: 5x week OT Frequency: 5x week            Contractures Contractures Info: Not present    Additional Factors Info  Code Status,Allergies Code Status Info: Full Allergies Info: Sulfa antibiotics, adhesive           Current Medications (02/23/2021):  This is the current hospital active medication list Current Facility-Administered Medications  Medication Dose Route Frequency Provider Last Rate Last Admin  .  stroke: mapping our early stages of recovery book   Does not apply Once Chotiner, Yevonne Aline, MD      . acetaminophen (TYLENOL) tablet 650 mg  650 mg Oral Q4H PRN Chotiner, Yevonne Aline, MD   650 mg at 02/22/21 1141   Or  . acetaminophen (TYLENOL) 160 MG/5ML solution 650 mg  650 mg Per Tube Q4H PRN Chotiner, Yevonne Aline, MD       Or  . acetaminophen (TYLENOL) suppository 650 mg  650 mg Rectal Q4H PRN Chotiner, Yevonne Aline, MD      . ALPRAZolam Duanne Moron) tablet 0.25 mg  0.25 mg Oral Daily PRN Chotiner, Yevonne Aline, MD   0.25 mg at 02/22/21 0439  . aspirin EC tablet 81 mg  81 mg Oral Daily Chotiner, Yevonne Aline, MD   81 mg at 02/23/21 1024  . atorvastatin (LIPITOR) tablet 40 mg  40 mg Oral Daily Chotiner, Yevonne Aline, MD   40 mg at 02/23/21 1024  . busPIRone (BUSPAR) tablet 10 mg  10 mg Oral BID Corena Pilgrim, MD   10 mg at 02/23/21 1439  . cinacalcet (SENSIPAR) tablet 30 mg  30 mg Oral Q breakfast Chotiner, Yevonne Aline, MD   30 mg at 02/23/21 1024  . multivitamin (RENA-VIT) tablet 1 tablet  1 tablet Oral QHS Chotiner, Yevonne Aline, MD   1 tablet at  02/22/21 2155  . perflutren lipid microspheres (DEFINITY) IV suspension  1-10 mL Intravenous PRN Dessa Phi, DO   2 mL at 02/23/21 1356     Discharge Medications: Please see discharge summary for a list of discharge medications.  Relevant Imaging Results:  Relevant Lab Results:   Additional Information SS#: [id number]; HD MWF at [facility], [place]

## 2021-02-23 NOTE — Progress Notes (Signed)
SLP Cancellation Note  Patient Details Name: [name] MRN: [id number] DOB: Mar 05, 1951   Cancelled treatment:       Reason Eval/Treat Not Completed: Patient at procedure or test/unavailable (Pt currently on bedside commode. SLP will follow up later as schedule allows.)  [name] I. Hardin Negus, [place], [place] Office number [phone] Pager [place] 02/23/2021, 4:37 PM

## 2021-02-23 NOTE — TOC Initial Note (Signed)
Transition of Care [facility]) - Initial/Assessment Note    Patient Details  Name: [name] MRN: [id number] Date of Birth: 09/25/1951  Transition of [facility]) CM/SW Contact:    Coralee Pesa, [place] Phone Number: 02/23/2021, 2:26 PM  Clinical Narrative:                  CSW met with pt at bedside and explained the SNF recommendation. Pt is agreeable and notes that she has been to [facility] before and would like to return if possible. She is agreeable to being faxed out further in [place] as a backup. Pt lives with her mother and her sister is available to help them both. Pt states that he goal is to get back home with her mother and sister. Pt has been fully covid vaccinated and boosted but states that she does not have her card and needs to get a new one.CSW will complete workup and faxout. SW will continue to follow for DC planning.  Expected Discharge Plan: Skilled Nursing Facility Barriers to Discharge: SNF Pending bed offer,Insurance Authorization,Continued Medical Work up   Patient Goals and CMS Choice Patient states their goals for this hospitalization and ongoing recovery are:: Pt states her goal is to be able to go back home. CMS Medicare.gov Compare Post Acute Care list provided to:: Patient Choice offered to / list presented to : Patient  Expected Discharge Plan and Services Expected Discharge Plan: [place] Acute Care Choice: [place] arrangements for the past 2 months: Single Family Home                                      Prior Living Arrangements/Services Living arrangements for the past 2 months: Single Family Home Lives with:: Parents Patient language and need for interpreter reviewed:: Yes Do you feel safe going back to the place where you live?: Yes      Need for Family Participation in Patient Care: Yes (Comment) Care giver support system in place?: Yes (comment)   Criminal Activity/Legal  Involvement Pertinent to Current Situation/Hospitalization: No - Comment as needed  Activities of Daily Living Home Assistive Devices/Equipment: Walker (specify type) ADL Screening (condition at time of admission) Patient's cognitive ability adequate to safely complete daily activities?: Yes Is the patient deaf or have difficulty hearing?: No Does the patient have difficulty seeing, even when wearing glasses/contacts?: Yes Does the patient have difficulty concentrating, remembering, or making decisions?: Yes Patient able to express need for assistance with ADLs?: Yes Does the patient have difficulty dressing or bathing?: No Independently performs ADLs?: Yes (appropriate for developmental age) Does the patient have difficulty walking or climbing stairs?: Yes Weakness of Legs: Both Weakness of Arms/Hands: Right  Permission Sought/Granted Permission sought to share information with : Family Supports Permission granted to share information with : Yes, Verbal Permission Granted  Share Information with NAME: Kathrynn Ducking     Permission granted to share info w Relationship: Sister  Permission granted to share info w Contact Information: [phone]  Emotional Assessment Appearance:: Appears stated age Attitude/Demeanor/Rapport: Engaged Affect (typically observed): Appropriate Orientation: : Oriented to Self,Oriented to Place,Oriented to  Time,Oriented to Situation Alcohol / Substance Use: Not Applicable Psych Involvement: No (comment)  Admission diagnosis:  CVA (cerebral vascular accident) ([place]) [I63.9] Acute focal neurological deficit [R29.818] Patient Active Problem List   Diagnosis Date Noted  .  Major depressive disorder, recurrent episode, moderate ([place]) 02/23/2021  . TIA (transient ischemic attack) 02/21/2021  . Prolonged QT interval 02/21/2021  . NSTEMI (non-ST elevated myocardial infarction) ([place]) 05/14/2020  . Volume overload 05/13/2020  . Hypotension after procedure  03/16/2020  . Chest pain 03/16/2020  . Change in mental state 03/15/2020  . Ventricular tachycardia ([place]) 03/15/2020  . Seizure ([place]) 03/15/2020  . Unresponsive episode 03/15/2020  . Pulmonary edema 01/01/2020  . Complication of vascular access for dialysis 12/21/2019  . Breakdown (mechanical) of surgically created arteriovenous fistula, initial encounter ([place]) 12/18/2019  . Weakness 10/11/2019  . 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 Dependence on renal dialysis ([place]) 07/24/2019  . Iron deficiency anemia, unspecified 06/09/2019  . Age-related osteoporosis without current pathological fracture 04/17/2019  . 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 Coagulation defect, unspecified ([place]) 04/17/2019  . Diarrhea, unspecified 04/17/2019  . Encounter for screening for depression 04/17/2019  . Headache, unspecified 04/17/2019  . 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 Pain, unspecified 04/17/2019  . Polyp of colon 04/17/2019  . Primary generalized (osteo)arthritis 04/17/2019  . Pruritus, unspecified 04/17/2019  . Pure hypercholesterolemia, unspecified 04/17/2019  . Secondary hyperparathyroidism of renal origin ([place]) 04/17/2019  . Gastro-esophageal reflux disease without esophagitis 04/17/2019  . Essential (primary) hypertension 04/17/2019  . Hyperlipidemia, unspecified 04/17/2019  . Transient cerebral ischemic attack, unspecified 04/17/2019  . Hypoxemia 12/13/2018  . Atrial fibrillation ([place]) 03/23/2017  . Prolonged Q-T interval on ECG 02/24/2017  . Hypokalemia 02/24/2017  . Acute ischemic stroke ([place]) 02/23/2017  . Malnutrition of moderate degree 12/24/2016  . Problem with dialysis access ([place]) 12/21/2016  . Acute ischemic colitis ([place]) 05/16/2016  . Colitis 05/15/2016  . Rectal bleeding 05/15/2016  . Neurologic abnormality 11/19/2015  . Hyperkalemia 11/19/2015  . Anxiety 11/19/2015  . Insomnia 11/19/2015  . Gait instability   . Stroke-like  symptom 11/16/2015  . Vestibular neuritis 11/16/2015  . Stroke (cerebrum) ([place]) 11/16/2015  . Dizziness 05/09/2015  . Ataxia 05/09/2015  . H/O: CVA (cerebrovascular accident) 05/09/2015  . Left facial numbness 05/09/2015  . Left leg numbness 05/09/2015  . Hyperlipidemia   . Acute on chronic diastolic heart failure ([place])   . Shortness of breath 04/01/2013  . HTN (hypertension) 04/01/2013  . Cholecystitis, acute 11/07/2012  . End-stage renal disease on hemodialysis ([place]) 11/07/2012  . Anemia in chronic kidney disease 11/07/2012   PCP:  [name], MD Pharmacy:   [facility] [address] [place] [address] Phone: [phone] Fax: [place], [place] AT [place] [address] Phone: [phone] Fax: [phone]  [facility] [place], [place] [address] [place] [address] Phone: [phone] Fax: [phone]     Social Determinants of Health (SDOH) Interventions    Readmission Risk Interventions No flowsheet data found.

## 2021-02-23 NOTE — Progress Notes (Signed)
  Echocardiogram 2D limited with Definity only has been performed.  [name] 02/23/2021, 1:51 PM

## 2021-02-23 NOTE — Consult Note (Signed)
[facility] Consult   Reason for Consult:  ''[name] has screened positive on the [place] Suicide Severity Rating Scale and/or has a precautions or involuntary commitment order.''   Referring Physician: Dessa Phi, DO Patient Identification: [name] MRN:  [id number] Principal Diagnosis: TIA (transient ischemic attack) Diagnosis:  Principal Problem:   TIA (transient ischemic attack) Active Problems:   End-stage renal disease on hemodialysis ([place])   HTN (hypertension)   Hyperlipidemia   Anxiety   Gait instability   Prolonged QT interval   Major depressive disorder, recurrent episode, moderate ([place])   Total Time spent with patient: 1 hour  Subjective:   [name] is a 70 y.o. female patient admitted with code stroke.  HPI:  70 y.o.femalewith medical history significant forHTN, ESRD on HD, GERD, CAD, HLD who presents as code stroke by EMS from home with complaint of confusion, slurred speech and abnormal gait.Psychiatric consult was requested after patient tested positive on [place] suicide severity rating scale. Today, patient is alert, oriented, awake, cooperative and calm. She reports history of depression many years ago for which she received treatment but has not been taking any anti-depressant for the past years. However, patient reports that she has been stressed out, overwhelmed and depressed about taking care of her aged mother with Dementia who is living with her. Also, reports being depressed due to her multiple medical issues and Stroke.Patient denies psychosis, delusions, mood swings, self harming thoughts, drug and alcohol abuse.  Past Psychiatric History: as above  Risk to Self:  denies Risk to Others:  denies Prior Inpatient Therapy:  none reported Prior Outpatient Therapy:  none  Past Medical History:  Past Medical History:  Diagnosis Date  . Adenomatous polyp of colon 10/2010, 2006, 2015  . Anemia in CKD (chronic  kidney disease) 11/07/2012   s/p blood transfusion.   . Arthritis   . CAD (coronary artery disease)    "something like that"  . CHF (congestive heart failure) ([place])   . Constipation   . Depression with anxiety   . Diverticula, colon   . 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  Had some early rejection then stable function for years, then had slow decline of function and went back on hemodialysis in 2012.  Gets HD TTS schedule at [facility] still using L forearm AVF.     Marland Kitchen GERD (gastroesophageal reflux disease)   . GI bleed 2017   felt to be ischemic colitis, last colo 2015  . Headache   . Hyperlipidemia   . Hypertension   . Neurologic gait dysfunction   . Neuromuscular disorder (HCC)    neuropathy hand and legs  . Osteoporosis   . Pneumonia   . 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 Pulmonary edema 12/2019  . Stroke [facility]) 11/2015   TIA  . Weight loss, unintentional     Past Surgical History:  Procedure Laterality Date  . AV FISTULA PLACEMENT     for dialysis  . AV FISTULA PLACEMENT Left 11/22/2015   Procedure: ARTERIOVENOUS (AV) FISTULA CREATION-LEFT BRACHIOCEPHALIC;  Surgeon: Serafina Mitchell, MD;  Location: [place];  Service: Vascular;  Laterality: Left;  . AV FISTULA PLACEMENT Right 03/15/2020   Procedure: INSERTION OF ARTERIOVENOUS (AV) GORE-TEX GRAFT ARM ( BRACHIAL AXILLARY );  Surgeon: Katha Cabal, MD;  Location: [facility];  Service: Vascular;  Laterality: Right;  . BACK SURGERY    . CERVICAL FUSION    . CHOLECYSTECTOMY  12/02/2012   Procedure: LAPAROSCOPIC CHOLECYSTECTOMY WITH INTRAOPERATIVE CHOLANGIOGRAM;  Surgeon: Edward Jolly, MD;  Location: [place];  Service: General;  Laterality: N/A;  . EYE SURGERY Bilateral    cataract surgery  . EYE SURGERY Left 2019   laser  . HEMATOMA EVACUATION Left 12/24/2016   Procedure: EVACUATION  HEMATOMA LEFT UPPER ARM;  Surgeon: Waynetta Sandy, MD;  Location: [place];  Service: Vascular;  Laterality: Left;  . I & D EXTREMITY Left 12/31/2016   Procedure: IRRIGATION AND DEBRIDEMENT EXTREMITY;  Surgeon: Angelia Mould, MD;  Location: [place];  Service: Vascular;  Laterality: Left;  . INSERTION OF DIALYSIS CATHETER Right 12/24/2016   Procedure: INSERTION OF DIALYSIS CATHETER;  Surgeon: Waynetta Sandy, MD;  Location: [place];  Service: Vascular;  Laterality: Right;  . INSERTION OF DIALYSIS CATHETER Right 02/04/2017   Procedure: INSERTION OF DIALYSIS CATHETER;  Surgeon: Waynetta Sandy, MD;  Location: [place];  Service: Vascular;  Laterality: Right;  . KIDNEY TRANSPLANT  1996  . PERIPHERAL VASCULAR CATHETERIZATION Left 10/23/2016   Procedure: Fistulagram;  Surgeon: Elam Dutch, MD;  Location: [place] [facility];  Service: Cardiovascular;  Laterality: Left;  . PERIPHERAL VASCULAR THROMBECTOMY Right 04/16/2020   Procedure: PERIPHERAL VASCULAR THROMBECTOMY;  Surgeon: Katha Cabal, MD;  Location: [place] [facility];  Service: Cardiovascular;  Laterality: Right;  . RESECTION OF ARTERIOVENOUS FISTULA ANEURYSM Left 11/22/2015   Procedure: RESECTION OF LEFT RADIOCEPHALIC FISTULA ANEURYSM ;  Surgeon: Serafina Mitchell, MD;  Location: [place];  Service: Vascular;  Laterality: Left;  . REVISON OF ARTERIOVENOUS FISTULA Left 12/22/2016   Procedure: REVISON OF LEFT ARTERIOVENOUS FISTULA;  Surgeon: Waynetta Sandy, MD;  Location: [place];  Service: Vascular;  Laterality: Left;  . REVISON OF ARTERIOVENOUS FISTULA Left 02/04/2017   Procedure: REVISON OF LEFT UPPER ARM ARTERIOVENOUS FISTULA;  Surgeon: Waynetta Sandy, MD;  Location: [place];  Service: Vascular;  Laterality: Left;  . UPPER EXTREMITY ANGIOGRAPHY Bilateral 09/19/2019   Procedure: UPPER EXTREMITY ANGIOGRAPHY;  Surgeon: Katha Cabal, MD;  Location: [place] [facility];  Service: Cardiovascular;   Laterality: Bilateral;   Family History:  Family History  Problem Relation Age of Onset  . Colon cancer Brother   . Cancer Brother   . Coronary artery disease Mother 26  . Hyperlipidemia Mother   . Hypertension Mother   . Stroke Maternal Aunt   . Esophageal cancer Neg Hx   . Stomach cancer Neg Hx   . Rectal cancer Neg Hx    Family Psychiatric  History:  Social History:  Social History   Substance and Sexual Activity  Alcohol Use No  . Alcohol/week: 0.0 standard drinks     Social History   Substance and Sexual Activity  Drug Use No    Social History   Socioeconomic History  . Marital status: Widowed    Spouse name: Not on file  . Number of children: 2  . Years of education: Not on file  . Highest education level: Not on file  Occupational History  . Not on file  Tobacco Use  . Smoking status: Former Smoker    Types: Cigarettes    Quit date: 12/31/1991    Years since quitting: 29.1  . Smokeless tobacco: Never Used  Vaping Use  . Vaping Use: Never used  Substance and Sexual Activity  .  Alcohol use: No    Alcohol/week: 0.0 standard drinks  . Drug use: No  . Sexual activity: Not Currently  Other Topics Concern  . Not on file  Social History Narrative   Living with her mother    Right handed   Caffeine: only decaf clears ("no brown sodas" or coffee d/t kidney disease)   Low potassium foods d/t kidney disease   Social Determinants of Health   Financial Resource Strain: Not on file  Food Insecurity: Not on file  Transportation Needs: Not on file  Physical Activity: Not on file  Stress: Not on file  Social Connections: Not on file   Additional Social History:    Allergies:   Allergies  Allergen Reactions  . Sulfa Antibiotics Other (See Comments)    Per patient, both parents allergic-so will not take  . Adhesive [Tape] Itching    Labs:  Results for orders placed or performed during the hospital encounter of 02/21/21 (from the past 48 hour(s))   Ethanol     Status: None   Collection Time: 02/21/21  9:54 PM  Result Value Ref Range   Alcohol, Ethyl (B) <10 <10 mg/dL    Comment: (NOTE) Lowest detectable limit for serum alcohol is 10 mg/dL.  For medical purposes only. Performed at [facility], [place] [address], [place], [place] [address]   Protime-INR     Status: None   Collection Time: 02/21/21  9:54 PM  Result Value Ref Range   Prothrombin Time 14.7 11.4 - 15.2 seconds   INR 1.2 0.8 - 1.2    Comment: (NOTE) INR goal varies based on device and disease states. Performed at [facility], [place] [address], [place], [place] [address]   APTT     Status: None   Collection Time: 02/21/21  9:54 PM  Result Value Ref Range   aPTT 29 24 - 36 seconds    Comment: Performed at [facility] [address] [place], [place] [address]  CBC     Status: Abnormal   Collection Time: 02/21/21  9:54 PM  Result Value Ref Range   WBC 5.9 4.0 - 10.5 K/uL   RBC 3.74 (L) 3.87 - 5.11 MIL/uL   Hemoglobin 11.4 (L) 12.0 - 15.0 g/dL   HCT 36.7 36.0 - 46.0 %   MCV 98.1 80.0 - 100.0 fL   MCH 30.5 26.0 - 34.0 pg   MCHC 31.1 30.0 - 36.0 g/dL   RDW 16.0 (H) 11.5 - 15.5 %   Platelets 213 150 - 400 K/uL   nRBC 0.0 0.0 - 0.2 %    Comment: Performed at [facility], [place] [address], [place], [place] [address]  Differential     Status: None   Collection Time: 02/21/21  9:54 PM  Result Value Ref Range   Neutrophils Relative % 55 %   Neutro Abs 3.3 1.7 - 7.7 K/uL   Lymphocytes Relative 25 %   Lymphs Abs 1.5 0.7 - 4.0 K/uL   Monocytes Relative 15 %   Monocytes Absolute 0.9 0.1 - 1.0 K/uL   Eosinophils Relative 4 %   Eosinophils Absolute 0.2 0.0 - 0.5 K/uL   Basophils Relative 1 %   Basophils Absolute 0.0 0.0 - 0.1 K/uL   Immature Granulocytes 0 %   Abs Immature Granulocytes 0.02 0.00 - 0.07 K/uL    Comment: Performed at [facility], [place] [address], [place], [place] [address]  Comprehensive metabolic panel  Status:  Abnormal   Collection Time: 02/21/21  9:54 PM  Result Value Ref Range   Sodium 143 135 - 145 mmol/L   Potassium 4.2 3.5 - 5.1 mmol/L   Chloride 98 98 - 111 mmol/L   CO2 34 (H) 22 - 32 mmol/L   Glucose, Bld 100 (H) 70 - 99 mg/dL    Comment: Glucose reference range applies only to samples taken after fasting for at least 8 hours.   BUN 19 8 - 23 mg/dL   Creatinine, Ser 3.73 (H) 0.44 - 1.00 mg/dL   Calcium 9.4 8.9 - 10.3 mg/dL   Total Protein 7.3 6.5 - 8.1 g/dL   Albumin 3.5 3.5 - 5.0 g/dL   AST 22 15 - 41 U/L   ALT 10 0 - 44 U/L   Alkaline Phosphatase 110 38 - 126 U/L   Total Bilirubin 0.8 0.3 - 1.2 mg/dL   GFR, Estimated 12 (L) >60 mL/min    Comment: (NOTE) Calculated using the CKD-EPI Creatinine Equation (2021)    Anion gap 11 5 - 15    Comment: Performed at [facility] [address], [place], [place] [id number]  CBG monitoring, ED     Status: None   Collection Time: 02/21/21  9:55 PM  Result Value Ref Range   Glucose-Capillary 84 70 - 99 mg/dL    Comment: Glucose reference range applies only to samples taken after fasting for at least 8 hours.  I-stat chem 8, ED     Status: Abnormal   Collection Time: 02/21/21  9:57 PM  Result Value Ref Range   Sodium 142 135 - 145 mmol/L   Potassium 4.1 3.5 - 5.1 mmol/L   Chloride 100 98 - 111 mmol/L   BUN 25 (H) 8 - 23 mg/dL   Creatinine, Ser 3.80 (H) 0.44 - 1.00 mg/dL   Glucose, Bld 100 (H) 70 - 99 mg/dL    Comment: Glucose reference range applies only to samples taken after fasting for at least 8 hours.   Calcium, Ion 0.99 (L) 1.15 - 1.40 mmol/L   TCO2 35 (H) 22 - 32 mmol/L   Hemoglobin 12.2 12.0 - 15.0 g/dL   HCT 36.0 36.0 - 46.0 %  Resp Panel by RT-PCR (Flu A&B, Covid) Nasopharyngeal Swab     Status: None   Collection Time: 02/21/21 10:51 PM   Specimen: Nasopharyngeal Swab; Nasopharyngeal(NP) swabs in vial transport medium  Result Value Ref Range   SARS Coronavirus 2 by RT PCR NEGATIVE NEGATIVE    Comment:  (NOTE) SARS-CoV-2 target nucleic acids are NOT DETECTED.  The SARS-CoV-2 RNA is generally detectable in upper respiratory specimens during the acute phase of infection. The lowest concentration of SARS-CoV-2 viral copies this assay can detect is 138 copies/mL. A negative result does not preclude SARS-Cov-2 infection and should not be used as the sole basis for treatment or other patient management decisions. A negative result may occur with  improper specimen collection/handling, submission of specimen other than nasopharyngeal swab, presence of viral mutation(s) within the areas targeted by this assay, and inadequate number of viral copies(<138 copies/mL). A negative result must be combined with clinical observations, patient history, and epidemiological information. The expected result is Negative.  Fact Sheet for Patients:  EntrepreneurPulse.com.au  Fact Sheet for Healthcare Providers:  IncredibleEmployment.be  This test is no t yet approved or cleared by the [place] FDA and  has been authorized for detection and/or diagnosis of SARS-CoV-2 by FDA under an Emergency Use  Authorization (EUA). This EUA will remain  in effect (meaning this test can be used) for the duration of the COVID-19 declaration under Section 564(b)(1) of the Act, 21 U.S.C.section 360bbb-3(b)(1), unless the authorization is terminated  or revoked sooner.       Influenza A by PCR NEGATIVE NEGATIVE   Influenza B by PCR NEGATIVE NEGATIVE    Comment: (NOTE) The Xpert Xpress SARS-CoV-2/FLU/RSV plus assay is intended as an aid in the diagnosis of influenza from Nasopharyngeal swab specimens and should not be used as a sole basis for treatment. Nasal washings and aspirates are unacceptable for Xpert Xpress SARS-CoV-2/FLU/RSV testing.  Fact Sheet for Patients: EntrepreneurPulse.com.au  Fact Sheet for Healthcare  Providers: IncredibleEmployment.be  This test is not yet approved or cleared by the [place] FDA and has been authorized for detection and/or diagnosis of SARS-CoV-2 by FDA under an Emergency Use Authorization (EUA). This EUA will remain in effect (meaning this test can be used) for the duration of the COVID-19 declaration under Section 564(b)(1) of the Act, 21 U.S.C. section 360bbb-3(b)(1), unless the authorization is terminated or revoked.  Performed at [facility], [place] [address], [place], [place] [address]   Hemoglobin A1c     Status: None   Collection Time: 02/22/21  2:49 AM  Result Value Ref Range   Hgb A1c MFr Bld 5.0 4.8 - 5.6 %    Comment: (NOTE) Pre diabetes:          5.7%-6.4%  Diabetes:              >6.4%  Glycemic control for   <7.0% adults with diabetes    Mean Plasma Glucose 96.8 mg/dL    Comment: Performed at [facility] [address], [place], [place] [address]  Lipid panel     Status: Abnormal   Collection Time: 02/22/21  2:49 AM  Result Value Ref Range   Cholesterol 191 0 - 200 mg/dL   Triglycerides 87 <150 mg/dL   HDL 57 >40 mg/dL   Total CHOL/HDL Ratio 3.4 RATIO   VLDL 17 0 - 40 mg/dL   LDL Cholesterol 117 (H) 0 - 99 mg/dL    Comment:        Total Cholesterol/HDL:CHD Risk Coronary Heart Disease Risk Table                     Men   Women  1/2 Average Risk   3.4   3.3  Average Risk       5.0   4.4  2 X Average Risk   9.6   7.1  3 X Average Risk  23.4   11.0        Use the calculated Patient Ratio above and the CHD Risk Table to determine the patient's CHD Risk.        ATP III CLASSIFICATION (LDL):  <100     mg/dL   Optimal  100-129  mg/dL   Near or Above                    Optimal  130-159  mg/dL   Borderline  160-189  mg/dL   High  >190     mg/dL   Very High Performed at [facility] [address], [place], [place] [address]   Basic metabolic panel     Status: Abnormal   Collection Time:  02/23/21  4:06 AM  Result Value Ref Range   Sodium 135  135 - 145 mmol/L   Potassium 5.8 (H) 3.5 - 5.1 mmol/L    Comment: SPECIMEN HEMOLYZED. HEMOLYSIS MAY AFFECT INTEGRITY OF RESULTS.   Chloride 95 (L) 98 - 111 mmol/L   CO2 24 22 - 32 mmol/L   Glucose, Bld 75 70 - 99 mg/dL    Comment: Glucose reference range applies only to samples taken after fasting for at least 8 hours.   BUN 51 (H) 8 - 23 mg/dL   Creatinine, Ser 6.52 (H) 0.44 - 1.00 mg/dL   Calcium 9.0 8.9 - 10.3 mg/dL   GFR, Estimated 6 (L) >60 mL/min    Comment: (NOTE) Calculated using the CKD-EPI Creatinine Equation (2021)    Anion gap 16 (H) 5 - 15    Comment: Performed at [facility] [address] [place], [place] [address]    Current Facility-Administered Medications  Medication Dose Route Frequency Provider Last Rate Last Admin  .  stroke: mapping our early stages of recovery book   Does not apply Once Chotiner, Yevonne Aline, MD      . acetaminophen (TYLENOL) tablet 650 mg  650 mg Oral Q4H PRN Chotiner, Yevonne Aline, MD   650 mg at 02/22/21 1141   Or  . acetaminophen (TYLENOL) 160 MG/5ML solution 650 mg  650 mg Per Tube Q4H PRN Chotiner, Yevonne Aline, MD       Or  . acetaminophen (TYLENOL) suppository 650 mg  650 mg Rectal Q4H PRN Chotiner, Yevonne Aline, MD      . ALPRAZolam Duanne Moron) tablet 0.25 mg  0.25 mg Oral Daily PRN Chotiner, Yevonne Aline, MD   0.25 mg at 02/22/21 0439  . aspirin EC tablet 81 mg  81 mg Oral Daily Chotiner, Yevonne Aline, MD   81 mg at 02/23/21 1024  . atorvastatin (LIPITOR) tablet 40 mg  40 mg Oral Daily Chotiner, Yevonne Aline, MD   40 mg at 02/23/21 1024  . busPIRone (BUSPAR) tablet 10 mg  10 mg Oral BID Sadarius Norman, MD      . cinacalcet (SENSIPAR) tablet 30 mg  30 mg Oral Q breakfast Chotiner, Yevonne Aline, MD   30 mg at 02/23/21 1024  . multivitamin (RENA-VIT) tablet 1 tablet  1 tablet Oral QHS Chotiner, Yevonne Aline, MD   1 tablet at 02/22/21 2155  . perflutren lipid microspheres (DEFINITY) IV suspension   1-10 mL Intravenous PRN Dessa Phi, DO   2 mL at 02/23/21 1356    Musculoskeletal: Strength & Muscle Tone: not tested Gait & Station: not tested Patient leans: N/A     Psychiatric Specialty Exam:  Presentation  General Appearance: Casual  Eye Contact:Good  Speech:Clear and Coherent  Speech Volume:Normal  Handedness:Right   Mood and Affect  Mood:Dysphoric  Affect:Constricted   Thought Process  Thought Processes:Coherent; Goal Directed  Descriptions of Associations:Intact  Orientation:Full (Time, Place and Person)  Thought Content:Abstract Reasoning  History of Schizophrenia/Schizoaffective disorder:No data recorded Duration of Psychotic Symptoms:No data recorded Hallucinations:Hallucinations: None  Ideas of Reference:None  Suicidal Thoughts:Suicidal Thoughts: No  Homicidal Thoughts:Homicidal Thoughts: No   Sensorium  Memory:Immediate Good  Judgment:Good  Insight:Good   Executive Functions  Concentration:Good  Attention Span:Good  [place] of Knowledge:Good  Language:Good   Psychomotor Activity  Psychomotor Activity:Psychomotor Activity: Psychomotor Retardation   Assets  Assets:Desire for Improvement   Sleep  Sleep:Sleep: Poor Number of Hours of Sleep: 4   Physical Exam: Physical Exam ROS Blood pressure (!) 124/55, pulse 72, temperature 98.1 F (36.7 C), temperature source  Oral, resp. rate 16, height 5\' 2"  (1.575 m), weight 54 kg, SpO2 90 %. Body mass index is 21.77 kg/m.  Treatment Plan Summary: 70 year old female with a very remote history of mental illness, multiple medical problem who was admitted for code stroke. Patient reports being depressed due to taking care of her aged mother with Dementia and her suffering from multiple medical issues. However, patient denies suicidal thoughts, psychosis, delusions and agreed to take medication to improved her symptoms.  Recommendations: -Consider Buspar 10 mg twice  daily for depression, please monitor QTc interval. -Consider referring patient to a Geriatric outpatient psychiatric clinic for medication management upon discharge  Disposition: No evidence of imminent risk to self or others at present.   Patient does not meet criteria for psychiatric inpatient admission. Supportive therapy provided about ongoing stressors. Psychiatric service signing out. Re-consult as needed  Corena Pilgrim, MD 02/23/2021 2:00 PM

## 2021-02-23 NOTE — Progress Notes (Signed)
PROGRESS NOTE    [name]  GXQ:[id number] DOB: [birth year] DOA: 02/21/2021 PCP: [name], MD     Brief Narrative:  Kathryn Wigger Hesteris a 70 y.o.femalewith medical history significant forHTN, ESRD on HD, GERD, CAD, HLD who presents as code stroke by EMS from home with complaint of confusion, slurred speech and abnormal gait.She reports that she had a normal dialysis session this afternoon and when she returned home around 6 PM she was feeling fine. She lives at home with her elderly mother who she helps care for. She states around 830 or 9pm, she noticed some slurred speech and some drooping of her left side of her face. She also had faulty walking as she was off balance. She states she did not fall. She denies any chest pain or palpitations. States she has not had any fever, cough, shortness of breath. 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 She did not notice any change in her swallowing. She did not have any seizure activity or syncope. Has history of smoking but quit approximately 30 years ago. Denies alcohol use.  Patient was seen by neurology as a code stroke.  She underwent head CT as well as MRI which were negative for any acute changes. Neurology was consulted.   New events last 24 hours / Subjective: Feeling well, symptoms have resolved and is back to baseline.  She is agreeable with PT recommendation for SNF placement.  She states that she lives at home with her elderly mother  Assessment & Plan:   Principal Problem:   TIA (transient ischemic attack) Active Problems:   End-stage renal disease on hemodialysis (HCC)   HTN (hypertension)   Hyperlipidemia   Anxiety   Gait instability   Prolonged QT interval   TIA -CT head without acute intracranial abnormality -MRI/MRA brain without acute intracranial abnormality, unchanged appearance of multiple chronic microhemorrhages and chronic small vessel disease -Carotid ultrasound  negative -Echocardiogram without LV thrombus. Will order limited contrast study to eval anteroapical/apex hypokinesis -EEG  Without seizures or epileptiform discharges seen  -Neurology following -PT rec SNF placement  -Continue aspirin, statin  ESRD -On dialysis MWF, last dialysis session was 3/11 -Nephrology consult   Hyperkalemia -Lokelma   Hypertension -Allow permissive hypertension.  Coreg and Cozaar on hold for now  Hyperlipidemia -Continue statin  Chronic diastolic HF -Stable     DVT prophylaxis:  SCD's Start: 02/22/21 0131  Code Status: Full Family Communication: None at bedside  Disposition Plan:  Status is: Inpatient  Remains inpatient appropriate because:Unsafe d/c plan   Dispo: The patient is from: Home              Anticipated d/c is to: SNF              Patient currently is medically stable to d/c. Await repeat echo. Need SNF placement.    Difficult to place patient No   Antimicrobials:  Anti-infectives (From admission, onward)   None        Objective: Vitals:   02/22/21 1951 02/22/21 2257 02/23/21 0358 02/23/21 0755  BP: 124/67 (!) 122/46 (!) 108/55 (!) 103/57  Pulse: 67 72 74 71  Resp: 18 18 18 16   Temp: 97.9 F (36.6 C) 98.3 F (36.8 C) 98 F (36.7 C) 97.9 F (36.6 C)  TempSrc: Oral Oral Oral Oral  SpO2: 99% 100% 96% 95%  Weight:      Height:        Intake/Output Summary (Last  24 hours) at 02/23/2021 1026 Last data filed at 02/22/2021 1300 Gross per 24 hour  Intake 120 ml  Output --  Net 120 ml   Filed Weights   02/22/21 1754  Weight: 54 kg    Examination:  General exam: Appears calm and comfortable  Respiratory system: Clear to auscultation. Respiratory effort normal. No respiratory distress. No conversational dyspnea.  Cardiovascular system: S1 & S2 heard, RRR. No murmurs. No pedal edema. Gastrointestinal system: Abdomen is nondistended, soft and nontender. Normal bowel sounds heard. Central nervous system: Alert  and oriented. No focal neurological deficits. Speech clear.  Extremities: Symmetric in appearance  Skin: No rashes, lesions or ulcers on exposed skin  Psychiatry: Judgement and insight appear normal. Mood & affect appropriate.   Data Reviewed: I have personally reviewed following labs and imaging studies  CBC: Recent Labs  Lab 02/21/21 2154 02/21/21 2157  WBC 5.9  --   NEUTROABS 3.3  --   HGB 11.4* 12.2  HCT 36.7 36.0  MCV 98.1  --   PLT 213  --    Basic Metabolic Panel: Recent Labs  Lab 02/21/21 2154 02/21/21 2157 02/23/21 0406  NA 143 142 135  K 4.2 4.1 5.8*  CL 98 100 95*  CO2 34*  --  24  GLUCOSE 100* 100* 75  BUN 19 25* 51*  CREATININE 3.73* 3.80* 6.52*  CALCIUM 9.4  --  9.0   GFR: Estimated Creatinine Clearance: 6.4 mL/min (A) (by C-G formula based on SCr of 6.52 mg/dL (H)). Liver Function Tests: Recent Labs  Lab 02/21/21 2154  AST 22  ALT 10  ALKPHOS 110  BILITOT 0.8  PROT 7.3  ALBUMIN 3.5   No results for input(s): LIPASE, AMYLASE in the last 168 hours. No results for input(s): AMMONIA in the last 168 hours. Coagulation Profile: Recent Labs  Lab 02/21/21 2154  INR 1.2   Cardiac Enzymes: No results for input(s): CKTOTAL, CKMB, CKMBINDEX, TROPONINI in the last 168 hours. BNP (last 3 results) No results for input(s): PROBNP in the last 8760 hours. HbA1C: Recent Labs    02/22/21 0249  HGBA1C 5.0   CBG: Recent Labs  Lab 02/21/21 2155  GLUCAP 84   Lipid Profile: Recent Labs    02/22/21 0249  CHOL 191  HDL 57  LDLCALC 117*  TRIG 87  CHOLHDL 3.4   Thyroid Function Tests: No results for input(s): TSH, T4TOTAL, FREET4, T3FREE, THYROIDAB in the last 72 hours. Anemia Panel: No results for input(s): VITAMINB12, FOLATE, FERRITIN, TIBC, IRON, RETICCTPCT in the last 72 hours. Sepsis Labs: No results for input(s): PROCALCITON, LATICACIDVEN in the last 168 hours.  Recent Results (from the past 240 hour(s))  Resp Panel by RT-PCR (Flu A&B,  Covid) Nasopharyngeal Swab     Status: None   Collection Time: 02/21/21 10:51 PM   Specimen: Nasopharyngeal Swab; Nasopharyngeal(NP) swabs in vial transport medium  Result Value Ref Range Status   SARS Coronavirus 2 by RT PCR NEGATIVE NEGATIVE Final    Comment: (NOTE) SARS-CoV-2 target nucleic acids are NOT DETECTED.  The SARS-CoV-2 RNA is generally detectable in upper respiratory specimens during the acute phase of infection. The lowest concentration of SARS-CoV-2 viral copies this assay can detect is 138 copies/mL. A negative result does not preclude SARS-Cov-2 infection and should not be used as the sole basis for treatment or other patient management decisions. A negative result may occur with  improper specimen collection/handling, submission of specimen other than nasopharyngeal swab, presence of viral mutation(s) within  the areas targeted by this assay, and inadequate number of viral copies(<138 copies/mL). A negative result must be combined with clinical observations, patient history, and epidemiological information. The expected result is Negative.  Fact Sheet for Patients:  EntrepreneurPulse.com.au  Fact Sheet for Healthcare Providers:  IncredibleEmployment.be  This test is no t yet approved or cleared by the [place] FDA and  has been authorized for detection and/or diagnosis of SARS-CoV-2 by FDA under an Emergency Use Authorization (EUA). This EUA will remain  in effect (meaning this test can be used) for the duration of the COVID-19 declaration under Section 564(b)(1) of the Act, 21 U.S.C.section 360bbb-3(b)(1), unless the authorization is terminated  or revoked sooner.       Influenza A by PCR NEGATIVE NEGATIVE Final   Influenza B by PCR NEGATIVE NEGATIVE Final    Comment: (NOTE) The Xpert Xpress SARS-CoV-2/FLU/RSV plus assay is intended as an aid in the diagnosis of influenza from Nasopharyngeal swab specimens and should  not be used as a sole basis for treatment. Nasal washings and aspirates are unacceptable for Xpert Xpress SARS-CoV-2/FLU/RSV testing.  Fact Sheet for Patients: EntrepreneurPulse.com.au  Fact Sheet for Healthcare Providers: IncredibleEmployment.be  This test is not yet approved or cleared by the [place] FDA and has been authorized for detection and/or diagnosis of SARS-CoV-2 by FDA under an Emergency Use Authorization (EUA). This EUA will remain in effect (meaning this test can be used) for the duration of the COVID-19 declaration under Section 564(b)(1) of the Act, 21 U.S.C. section 360bbb-3(b)(1), unless the authorization is terminated or revoked.  Performed at [facility], [place] [address], [place], [place] [address]       Radiology Studies: MR ANGIO HEAD WO CONTRAST  Result Date: 02/22/2021 CLINICAL DATA:  Slurred speech and abnormal gait EXAM: MRI HEAD WITHOUT CONTRAST MRA HEAD WITHOUT CONTRAST TECHNIQUE: Multiplanar, multiecho pulse sequences of the brain and surrounding structures were obtained without intravenous contrast. Angiographic images of the head were obtained using MRA technique without contrast. COMPARISON:  None. FINDINGS: MRI HEAD FINDINGS Brain: No acute infarct, mass effect or extra-axial collection. Numerous chronic microhemorrhages supratentorially and infratentorially. Distribution is unchanged. Hyperintense T2-weighted signal is moderately widespread throughout the white matter. Generalized volume loss without a clear lobar predilection. The midline structures are normal. Vascular: Major flow voids are preserved. Skull and upper cervical spine: Normal calvarium and skull base. Visualized upper cervical spine and soft tissues are normal. Sinuses/Orbits:No paranasal sinus fluid levels or advanced mucosal thickening. No mastoid or middle ear effusion. Normal orbits. MRA HEAD FINDINGS POSTERIOR CIRCULATION: --Vertebral  arteries: Normal --Inferior cerebellar arteries: Normal. --Basilar artery: Normal. --Superior cerebellar arteries: Normal. --Posterior cerebral arteries: Mild left P1-2 junction stenosis. ANTERIOR CIRCULATION: --Intracranial internal carotid arteries: Normal. --Anterior cerebral arteries (ACA): Normal. --Middle cerebral arteries (MCA): Normal. ANATOMIC VARIANTS: None IMPRESSION: 1. No acute intracranial abnormality. 2. Unchanged appearance of multiple chronic microhemorrhages and chronic small vessel disease. 3. Mild left P1-2 junction stenosis.  Otherwise normal MRA. Electronically Signed   By: Ulyses Jarred M.D.   On: 02/22/2021 00:51   MR Brain Wo Contrast (neuro protocol)  Result Date: 02/22/2021 CLINICAL DATA:  Slurred speech and abnormal gait EXAM: MRI HEAD WITHOUT CONTRAST MRA HEAD WITHOUT CONTRAST TECHNIQUE: Multiplanar, multiecho pulse sequences of the brain and surrounding structures were obtained without intravenous contrast. Angiographic images of the head were obtained using MRA technique without contrast. COMPARISON:  None. FINDINGS: MRI HEAD FINDINGS Brain: No acute infarct, mass effect or extra-axial collection. Numerous  chronic microhemorrhages supratentorially and infratentorially. Distribution is unchanged. Hyperintense T2-weighted signal is moderately widespread throughout the white matter. Generalized volume loss without a clear lobar predilection. The midline structures are normal. Vascular: Major flow voids are preserved. Skull and upper cervical spine: Normal calvarium and skull base. Visualized upper cervical spine and soft tissues are normal. Sinuses/Orbits:No paranasal sinus fluid levels or advanced mucosal thickening. No mastoid or middle ear effusion. Normal orbits. MRA HEAD FINDINGS POSTERIOR CIRCULATION: --Vertebral arteries: Normal --Inferior cerebellar arteries: Normal. --Basilar artery: Normal. --Superior cerebellar arteries: Normal. --Posterior cerebral arteries: Mild left P1-2  junction stenosis. ANTERIOR CIRCULATION: --Intracranial internal carotid arteries: Normal. --Anterior cerebral arteries (ACA): Normal. --Middle cerebral arteries (MCA): Normal. ANATOMIC VARIANTS: None IMPRESSION: 1. No acute intracranial abnormality. 2. Unchanged appearance of multiple chronic microhemorrhages and chronic small vessel disease. 3. Mild left P1-2 junction stenosis.  Otherwise normal MRA. Electronically Signed   By: Ulyses Jarred M.D.   On: 02/22/2021 00:51   EEG adult  Result Date: 02/22/2021 Lora Havens, MD     02/22/2021  5:23 PM Patient Name: SHTERNA LARAMEE MRN: [id number] Epilepsy Attending: Lora Havens Referring Physician/Provider: Dr Lestine Mount Date: 02/22/2021 Duration: 27.38 mins Patient history:  70 y.o. 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 EEG to evaluate for seizure Level of alertness: Awake AEDs during EEG study: None Technical aspects: This EEG study was done with scalp electrodes positioned according to the 10-20 International system of electrode placement. Electrical activity was acquired at a sampling rate of 500Hz  and reviewed with a high frequency filter of 70Hz  and a low frequency filter of 1Hz . EEG data were recorded continuously and digitally stored. Description: The posterior dominant rhythm consists of 8-9 Hz activity of moderate voltage (25-35 uV) seen predominantly in posterior head regions, symmetric and reactive to eye opening and eye closing. Hyperventilation and photic stimulation were not performed.   IMPRESSION: This study is within normal limits. No seizures or epileptiform discharges were seen throughout the recording. Lora Havens   ECHOCARDIOGRAM COMPLETE  Result Date: 02/22/2021    ECHOCARDIOGRAM REPORT   Patient Name:   ORNELLA CODERRE Date of Exam: 02/22/2021 Medical Rec #:  [id number]         Height:       62.0 in  Accession #:    [id number]        Weight:       115.8 lb Date of Birth:  [birth year]         BSA:          1.516 m Patient Age:    36 years          BP:           117/51 mmHg Patient Gender: F                 HR:           74 bpm. Exam Location:  Inpatient Procedure: 2D Echo Indications:    stroke  History:        Patient has prior history of Echocardiogram examinations, most                 recent 05/14/2020. End stage renal disese and Stroke; Risk                 Factors:Hypertension and Dyslipidemia.  Sonographer:    Johny Chess Referring Phys: [id number] PNTIRWE  S CHOTINER IMPRESSIONS  1. No LV thrombus noted on this non-contrast study. Would recommend limited contrast study given anteroapical/apex hypokinesis. Grade 2 DD is present. Moderately elevated RVSP.  2. Left ventricular ejection fraction, by estimation, is 60 to 65%. The left ventricle has normal function. The left ventricle demonstrates regional wall motion abnormalities (see scoring diagram/findings for description). Left ventricular diastolic parameters are consistent with Grade II diastolic dysfunction (pseudonormalization). Elevated left atrial pressure.  3. Right ventricular systolic function is normal. The right ventricular size is moderately enlarged. There is moderately elevated pulmonary artery systolic pressure. The estimated right ventricular systolic pressure is 16.3 mmHg.  4. Left atrial size was moderately dilated.  5. The mitral valve is degenerative. Mild mitral valve regurgitation. No evidence of mitral stenosis.  6. Tricuspid valve regurgitation is moderate.  7. The aortic valve is tricuspid. There is mild calcification of the aortic valve. There is mild thickening of the aortic valve. Aortic valve regurgitation is not visualized. Mild aortic valve sclerosis is present, with no evidence of aortic valve stenosis.  8. The inferior vena cava is normal in size with greater than 50% respiratory variability, suggesting right atrial  pressure of 3 mmHg. Comparison(s): No prior Echocardiogram. FINDINGS  Left Ventricle: Left ventricular ejection fraction, by estimation, is 60 to 65%. The left ventricle has normal function. The left ventricle demonstrates regional wall motion abnormalities. The left ventricular internal cavity size was normal in size. There is no left ventricular hypertrophy. Left ventricular diastolic parameters are consistent with Grade II diastolic dysfunction (pseudonormalization). Elevated left atrial pressure.  LV Wall Scoring: The apical anterior segment and apex are hypokinetic. Right Ventricle: The right ventricular size is moderately enlarged. No increase in right ventricular wall thickness. Right ventricular systolic function is normal. There is moderately elevated pulmonary artery systolic pressure. The tricuspid regurgitant  velocity is 3.85 m/s, and with an assumed right atrial pressure of 3 mmHg, the estimated right ventricular systolic pressure is 84.6 mmHg. Left Atrium: Left atrial size was moderately dilated. Right Atrium: Right atrial size was normal in size. Pericardium: Trivial pericardial effusion is present. Mitral Valve: The mitral valve is degenerative in appearance. Mild mitral annular calcification. Mild mitral valve regurgitation. No evidence of mitral valve stenosis. Tricuspid Valve: The tricuspid valve is grossly normal. Tricuspid valve regurgitation is moderate . No evidence of tricuspid stenosis. Aortic Valve: The aortic valve is tricuspid. There is mild calcification of the aortic valve. There is mild thickening of the aortic valve. Aortic valve regurgitation is not visualized. Mild aortic valve sclerosis is present, with no evidence of aortic valve stenosis. Aortic valve mean gradient measures 5.9 mmHg. Aortic valve peak gradient measures 12.6 mmHg. Aortic valve area, by VTI measures 2.35 cm. Pulmonic Valve: The pulmonic valve was grossly normal. Pulmonic valve regurgitation is not visualized. No  evidence of pulmonic stenosis. Aorta: The aortic root and ascending aorta are structurally normal, with no evidence of dilitation. Venous: The inferior vena cava is normal in size with greater than 50% respiratory variability, suggesting right atrial pressure of 3 mmHg. IAS/Shunts: The atrial septum is grossly normal.  LEFT VENTRICLE PLAX 2D LVIDd:         3.90 cm     Diastology LVIDs:         2.50 cm     LV e' medial:    5.98 cm/s LV PW:         0.90 cm     LV E/e' medial:  27.8 LV IVS:  1.00 cm     LV e' lateral:   8.05 cm/s LVOT diam:     1.90 cm     LV E/e' lateral: 20.6 LV SV:         83 LV SV Index:   55 LVOT Area:     2.84 cm  LV Volumes (MOD) LV vol d, MOD A2C: 76.9 ml LV vol s, MOD A2C: 28.1 ml LV SV MOD A2C:     48.8 ml RIGHT VENTRICLE             IVC RV S prime:     11.00 cm/s  IVC diam: 1.70 cm TAPSE (M-mode): 2.2 cm LEFT ATRIUM             Index       RIGHT ATRIUM           Index LA diam:        3.80 cm 2.51 cm/m  RA Area:     17.60 cm LA Vol (A2C):   69.0 ml 45.53 ml/m RA Volume:   45.70 ml  30.15 ml/m LA Vol (A4C):   66.1 ml 43.62 ml/m LA Biplane Vol: 69.9 ml 46.12 ml/m  AORTIC VALVE AV Area (Vmax):    2.43 cm AV Area (Vmean):   2.48 cm AV Area (VTI):     2.35 cm AV Vmax:           177.69 cm/s AV Vmean:          113.109 cm/s AV VTI:            0.355 m AV Peak Grad:      12.6 mmHg AV Mean Grad:      5.9 mmHg LVOT Vmax:         152.00 cm/s LVOT Vmean:        98.800 cm/s LVOT VTI:          0.294 m LVOT/AV VTI ratio: 0.83  AORTA Ao Root diam: 3.00 cm Ao Asc diam:  3.80 cm MITRAL VALVE                 TRICUSPID VALVE MV Area (PHT): 4.21 cm      TR Peak grad:   59.3 mmHg MV Decel Time: 180 msec      TR Vmax:        385.00 cm/s MR Peak grad:    123.7 mmHg MR Mean grad:    96.0 mmHg   SHUNTS MR Vmax:         556.00 cm/s Systemic VTI:  0.29 m MR Vmean:        476.0 cm/s  Systemic Diam: 1.90 cm MR PISA:         1.01 cm MR PISA Eff ROA: 8 mm MR PISA Radius:  0.40 cm MV E velocity: 166.00 cm/s  MV A velocity: 95.60 cm/s MV E/A ratio:  1.74 Eleonore Chiquito MD Electronically signed by Eleonore Chiquito MD Signature Date/Time: 02/22/2021/1:16:17 PM    Final    CT HEAD CODE STROKE WO CONTRAST  Result Date: 02/21/2021 CLINICAL DATA:  Code stroke.  Slurred speech EXAM: CT HEAD WITHOUT CONTRAST TECHNIQUE: Contiguous axial images were obtained from the base of the skull through the vertex without intravenous contrast. COMPARISON:  Head CT 05/13/2018 FINDINGS: Brain: There is no mass, hemorrhage or extra-axial collection. There is generalized atrophy without lobar predilection. There is hypoattenuation of the periventricular white matter, most commonly indicating chronic ischemic microangiopathy. Vascular: Atherosclerotic calcification of the vertebral  and internal carotid arteries at the skull base. No abnormal hyperdensity of the major intracranial arteries or dural venous sinuses. Skull: The visualized skull base, calvarium and extracranial soft tissues are normal. Upper cervical spinal canal appears narrowed. Sinuses/Orbits: No fluid levels or advanced mucosal thickening of the visualized paranasal sinuses. No mastoid or middle ear effusion. The orbits are normal. ASPECTS [facility] Stroke Program Early CT Score) - Ganglionic level infarction (caudate, lentiform nuclei, internal capsule, insula, M1-M3 cortex): 7 - Supraganglionic infarction (M4-M6 cortex): 3 Total score (0-10 with 10 being normal): 10 IMPRESSION: 1. No acute intracranial abnormality. 2. ASPECTS is 10. 3. 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 These results were communicated to Dr. Kerney Elbe at 10:12 pm on 02/21/2021 by text page via the [facility] messaging system. Electronically Signed   By: Ulyses Jarred M.D.   On: 02/21/2021 22:12   VAS US CAROTID (at [facility] and [facility] only)  Result Date: 02/22/2021 Carotid Arterial Duplex Study Indications:       Speech disturbance, Weakness and irregular gait. Risk Factors:      Hypertension, hyperlipidemia, past  history of smoking,                    coronary artery disease. Other Factors:     ESRD on dialysis. Comparison Study:  Prior normal carotid duplex done 02/22/18 Performing Technologist: Sharion Dove RVS  Examination Guidelines: A complete evaluation includes B-mode imaging, spectral Doppler, color Doppler, and power Doppler as needed of all accessible portions of each vessel. Bilateral testing is considered an integral part of a complete examination. Limited examinations for reoccurring indications may be performed as noted.  Right Carotid Findings: +----------+--------+--------+--------+------------------+---------+           PSV cm/sEDV cm/sStenosisPlaque DescriptionComments  +----------+--------+--------+--------+------------------+---------+ CCA Prox  88      11                                          +----------+--------+--------+--------+------------------+---------+ CCA Distal75      6                                           +----------+--------+--------+--------+------------------+---------+ ICA Prox  55      8               heterogenous      Shadowing +----------+--------+--------+--------+------------------+---------+ ICA Distal70      13                                          +----------+--------+--------+--------+------------------+---------+ ECA       196     9               calcific                    +----------+--------+--------+--------+------------------+---------+ +----------+--------+-------+--------+-------------------+           PSV cm/sEDV cmsDescribeArm Pressure (mmHG) +----------+--------+-------+--------+-------------------+ [id number]                                        +----------+--------+-------+--------+-------------------+ +---------+--------+--+--------+--+ VertebralPSV cm/s95EDV cm/s20 +---------+--------+--+--------+--+  Left Carotid Findings:  +----------+--------+--------+--------+------------------+------------------+           PSV cm/sEDV cm/sStenosisPlaque DescriptionComments           +----------+--------+--------+--------+------------------+------------------+ CCA Prox  103     8                                 intimal thickening +----------+--------+--------+--------+------------------+------------------+ CCA Distal69      8                                 intimal thickening +----------+--------+--------+--------+------------------+------------------+ ICA Prox  59      12              heterogenous                         +----------+--------+--------+--------+------------------+------------------+ ICA Distal60      15                                                   +----------+--------+--------+--------+------------------+------------------+ ECA       62      12                                                   +----------+--------+--------+--------+------------------+------------------+ +----------+--------+--------+--------+-------------------+           PSV cm/sEDV cm/sDescribeArm Pressure (mmHG) +----------+--------+--------+--------+-------------------+ Subclavian117                                         +----------+--------+--------+--------+-------------------+ +---------+--------+--+--------+-+ VertebralPSV cm/s44EDV cm/s5 +---------+--------+--+--------+-+   Summary: Right Carotid: The extracranial vessels were near-normal with only minimal wall                thickening or plaque. Left Carotid: The extracranial vessels were near-normal with only minimal wall               thickening or plaque. Vertebrals:  Bilateral vertebral arteries demonstrate antegrade flow. Subclavians: Normal flow hemodynamics were seen in bilateral subclavian              arteries. *See table(s) above for measurements and observations.  Electronically signed by Antony Contras MD on 02/22/2021 at 12:51:37  PM.    Final       Scheduled Meds: .  stroke: mapping our early stages of recovery book   Does not apply Once  . aspirin EC  81 mg Oral Daily  . atorvastatin  40 mg Oral Daily  . cinacalcet  30 mg Oral Q breakfast  . multivitamin  1 tablet Oral QHS   Continuous Infusions:   LOS: 2 days      Time spent: 25 minutes   Dessa Phi, DO [facility] 02/23/2021, 10:26 AM   Available via Epic secure chat 7am-7pm After these hours, please refer to coverage provider listed on [email or web address]

## 2021-02-23 NOTE — Progress Notes (Signed)
STROKE TEAM PROGRESS NOTE   INTERVAL HISTORY She states she is feeling better today.  She appears more oriented and with the mass appropriate questions.  EEG done yesterday was normal without any epileptiform activity.  Left-ventricular ejection fraction of 60 to 65%.  There was anteroapical/apex hypokinesis.  Vitals:   02/22/21 1951 02/22/21 2257 02/23/21 0358 02/23/21 0755  BP: 124/67 (!) 122/46 (!) 108/55 (!) 103/57  Pulse: 67 72 74 71  Resp: 18 18 18 16   Temp: 97.9 F (36.6 C) 98.3 F (36.8 C) 98 F (36.7 C) 97.9 F (36.6 C)  TempSrc: Oral Oral Oral Oral  SpO2: 99% 100% 96% 95%  Weight:      Height:       CBC:  Recent Labs  Lab 02/21/21 2154 02/21/21 2157  WBC 5.9  --   NEUTROABS 3.3  --   HGB 11.4* 12.2  HCT 36.7 36.0  MCV 98.1  --   PLT 213  --    Basic Metabolic Panel:  Recent Labs  Lab 02/21/21 2154 02/21/21 2157 02/23/21 0406  NA 143 142 135  K 4.2 4.1 5.8*  CL 98 100 95*  CO2 34*  --  24  GLUCOSE 100* 100* 75  BUN 19 25* 51*  CREATININE 3.73* 3.80* 6.52*  CALCIUM 9.4  --  9.0   Lipid Panel:  Recent Labs  Lab 02/22/21 0249  CHOL 191  TRIG 87  HDL 57  CHOLHDL 3.4  VLDL 17  LDLCALC 117*   HgbA1c:  Recent Labs  Lab 02/22/21 0249  HGBA1C 5.0   Urine Drug Screen: No results for input(s): LABOPIA, COCAINSCRNUR, LABBENZ, AMPHETMU, THCU, LABBARB in the last 168 hours.  Alcohol Level  Recent Labs  Lab 02/21/21 2154  ETH <10    IMAGING past 24 hours EEG adult  Result Date: 02/22/2021 Lora Havens, MD     02/22/2021  5:23 PM Patient Name: [name] MRN: [id number] Epilepsy Attending: Lora Havens Referring Physician/Provider: Dr Lestine Mount Date: 02/22/2021 Duration: 27.38 mins Patient history:  70 y.o. 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 EEG to evaluate for seizure Level of alertness: Awake AEDs during EEG  study: None Technical aspects: This EEG study was done with scalp electrodes positioned according to the 10-20 International system of electrode placement. Electrical activity was acquired at a sampling rate of 500Hz  and reviewed with a high frequency filter of 70Hz  and a low frequency filter of 1Hz . EEG data were recorded continuously and digitally stored. Description: The posterior dominant rhythm consists of 8-9 Hz activity of moderate voltage (25-35 uV) seen predominantly in posterior head regions, symmetric and reactive to eye opening and eye closing. Hyperventilation and photic stimulation were not performed.   IMPRESSION: This study is within normal limits. No seizures or epileptiform discharges were seen throughout the recording. [name]    PHYSICAL EXAM Blood pressure (!) 103/57, pulse 71, temperature 97.9 F (36.6 C), temperature source Oral, resp. rate 16, height 5\' 2"  (1.575 m), weight 54 kg, SpO2 95 %.  General: alert and awake, elderly african [place] female, no apparent distress  Lungs: Symmetrical Chest rise, no labored breathing  Cardio: Regular Rate and Rhythm  Abdomen: Soft, non-tender  Neuro: Awake alert, oriented, thought content appropriate.  Speech fluent without evidence of aphasia.  Able to follow all commands without difficulty.  Cranial Nerves: II:  Visual fields grossly normal,  pupils equal, round, reactive to light and accommodation III,IV, VI: ptosis not present, extra-ocular motions intact bilaterally V,VII: smile symmetric, facial light touch sensation normal bilaterally VIII: hearing normal bilaterally IX,X: uvula rises symmetrically XI: bilateral shoulder shrug XII: midline tongue extension without atrophy or fasciculations  Motor: Right : Upper extremity   4+/5    Left:     Upper extremity   4+/5  Lower extremity   4+/5    Lower extremity   4+/5 Tone and bulk:normal tone throughout; no atrophy noted Sensory: light touch intact throughout,  bilaterally Cerebellar: normal finger-to-nose,  normal heel-to-shin test Gait: deferred    ASSESSMENT/PLAN [name] is a 70 y.o. female with history of multiple medical conditions including ESRD on HD presenting via EMS from home as a Code Stroke with acute onset of left facial droop, confusion and slurred speech. LKN was 8:00 PM. She received dialysis today and arrived back home at 6:00 PM without any deficits at that time. 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 She has an MRI from 2016 which documents chronic cerebral microhemorrhages.   Given her image results and history stroke appears less likely. She reports she has had some similar episodes in the past but this was the most severe. Will order EEG to monitor for seizure. During interview she was unable to draw a clock, recall 3 items, or name more than 3 animals. She does appear to have dementia, will need to have outpatient follow up for this once discharged.  Seizure vs TIA: Unchanged appearance of multiple chronic microhemorrhages and chronic small vessel disease on MRI without evidence of new stroke  Code Stroke CT head No acute intracranial abnormality. ASPECTS 10.     MRA - No acute intracranial abnormality. Unchanged appearance of multiple chronic microhemorrhages and chronic small vessel disease. Mild left P1-2 junction stenosis.  Otherwise normal MRA.  Carotid Doppler - Right and Left Carotid: The extracranial vessels were near-normal with only minimal wall thickening or plaque.  2D Echo-ejection fraction 60 to 65%.  Anterior apical and apex hypokinesis.    LDL 117  HgbA1c 5.0  VTE prophylaxis - SCD's    Diet   Diet renal with fluid restriction Fluid restriction: 1200 mL Fluid; Room service appropriate? Yes; Fluid consistency: Thin    aspirin 81 mg daily prior to admission, now on aspirin 81 mg daily.   Therapy  recommendations:  SNF  Disposition:  Awaiting medical clearance  Hypertension  Home meds: Carvedilol, Losartan,  Stable . Long-term BP goal normotensive  Hyperlipidemia  Home meds:  Atorvastatin, resumed in hospital  LDL 117, goal < 70  Increase to 80 mg daily   High intensity statin Atorvastatin  Continue statin at discharge   Other Stroke Risk Factors  Advanced Age >/= 5   Former Cigarette smoker  Hx stroke/TIA  Coronary artery disease  Congestive heart failure  Other Active Problems  ESRD  Hospital day # 2   Exact etiology of the episode is unclear TIA versus seizure.  Continue aspirin 81 mg daily.  And aggressive risk factor modification.  No family available at the bedside for discussion.  Stroke team will sign off.  Kindly call for questions.  Greater than 50% time during this 25-minute visit was spent on counseling and coordination of care about her episode of seizure versus TIA.  Discussed with Dr. Melvenia Needles,  MD Medical Director [place] Pager: [phone] 02/23/2021 12:21 PM   To contact Stroke Continuity provider, please refer to [email or web address] After hours, contact General Neurology

## 2021-02-23 NOTE — Plan of Care (Signed)
  Problem: Education: Goal: Knowledge of secondary prevention will improve Outcome: Progressing   Problem: Education: Goal: Knowledge of disease or condition will improve Outcome: Progressing   

## 2021-02-24 LAB — CBC
HCT: 30.3 % — ABNORMAL LOW (ref 36.0–46.0)
Hemoglobin: 10.1 g/dL — ABNORMAL LOW (ref 12.0–15.0)
MCH: 31 pg (ref 26.0–34.0)
MCHC: 33.3 g/dL (ref 30.0–36.0)
MCV: 92.9 fL (ref 80.0–100.0)
Platelets: 201 10*3/uL (ref 150–400)
RBC: 3.26 MIL/uL — ABNORMAL LOW (ref 3.87–5.11)
RDW: 15.9 % — ABNORMAL HIGH (ref 11.5–15.5)
WBC: 5 10*3/uL (ref 4.0–10.5)
nRBC: 0 % (ref 0.0–0.2)

## 2021-02-24 LAB — BASIC METABOLIC PANEL
Anion gap: 16 — ABNORMAL HIGH (ref 5–15)
BUN: 68 mg/dL — ABNORMAL HIGH (ref 8–23)
CO2: 26 mmol/L (ref 22–32)
Calcium: 8.4 mg/dL — ABNORMAL LOW (ref 8.9–10.3)
Chloride: 95 mmol/L — ABNORMAL LOW (ref 98–111)
Creatinine, Ser: 8.69 mg/dL — ABNORMAL HIGH (ref 0.44–1.00)
GFR, Estimated: 5 mL/min — ABNORMAL LOW (ref >60)
Glucose, Bld: 91 mg/dL (ref 70–99)
Potassium: 5.3 mmol/L — ABNORMAL HIGH (ref 3.5–5.1)
Sodium: 137 mmol/L (ref 135–145)

## 2021-02-24 LAB — SARS CORONAVIRUS 2 (TAT 6-24 HRS): SARS Coronavirus 2: NEGATIVE

## 2021-02-24 MED ORDER — HEPARIN SODIUM (PORCINE) 1000 UNIT/ML DIALYSIS: 1000.0000 [IU] | INTRAMUSCULAR | Status: DC | PRN

## 2021-02-24 MED ORDER — ATORVASTATIN CALCIUM 80 MG PO TABS
80.0000 mg | ORAL_TABLET | Freq: Every day | ORAL | Status: DC
  Administered 2021-02-25: 80 mg via ORAL
  Filled 2021-02-24 (×2): qty 1

## 2021-02-24 MED ORDER — PENTAFLUOROPROP-TETRAFLUOROETH EX AERO: 1.0000 "application " | INHALATION_SPRAY | CUTANEOUS | Status: DC | PRN

## 2021-02-24 MED ORDER — SODIUM CHLORIDE 0.9 % IV SOLN: 100.0000 mL | INTRAVENOUS | Status: DC | PRN

## 2021-02-24 MED ORDER — ALTEPLASE 2 MG IJ SOLR: 2.0000 mg | Freq: Once | INTRAMUSCULAR | Status: DC | PRN

## 2021-02-24 MED ORDER — LIDOCAINE-PRILOCAINE 2.5-2.5 % EX CREA: 1.0000 "application " | TOPICAL_CREAM | CUTANEOUS | Status: DC | PRN

## 2021-02-24 MED ORDER — LIDOCAINE HCL (PF) 1 % IJ SOLN: 5.0000 mL | INTRAMUSCULAR | Status: DC | PRN

## 2021-02-24 NOTE — Plan of Care (Signed)
  Problem: Education: Goal: Knowledge of secondary prevention will improve Outcome: Progressing   Problem: Education: Goal: Knowledge of disease or condition will improve Outcome: Progressing   

## 2021-02-24 NOTE — Progress Notes (Signed)
Physical Therapy Treatment Patient Details Name: [name] MRN: [id number] DOB: 1951/07/28 Today's Date: 02/24/2021    History of Present Illness Pt is a 70 y/o female with PMH of HTN, ESRD on HD, CAD, HLD, presents as code stroke by EMS with confusion, slurred speech and abnormal gait. CT and MRI negative.    PT Comments    Pt with improved mobility today. Fatigues quickly and will need to pace herself with activity at home. Pt wants to return home and sister can provide support and is at the home from morning until evening. Updated recommendations to HHPT with assist for mobility.    Follow Up Recommendations  Home health PT;Supervision for mobility/OOB     Equipment Recommendations       Recommendations for Other Services       Precautions / Restrictions Precautions Precautions: Fall    Mobility  Bed Mobility Overal bed mobility: Modified Independent Bed Mobility: Supine to Sit;Sit to Supine     Supine to sit: HOB elevated;Modified independent (Device/Increase time) Sit to supine: Modified independent (Device/Increase time);HOB elevated        Transfers Overall transfer level: Needs assistance Equipment used: Rolling walker (2 wheeled) Transfers: Sit to/from [facility] Sit to Stand: Supervision         General transfer comment: supervision for safety  Ambulation/Gait Ambulation/Gait assistance: Min guard Gait Distance (Feet): 100 Feet Assistive device: Rolling walker (2 wheeled) Gait Pattern/deviations: Step-through pattern;Decreased stride length;Trunk flexed Gait velocity: decr Gait velocity interpretation: <1.31 ft/sec, indicative of household ambulator General Gait Details: Assist for safety and verbal cues to stay closer to the walker when turning. Fatigued quickly   Marine scientist [place] (Stroke Patients Only)       Balance Overall balance assessment: Needs  assistance Sitting-balance support: No upper extremity supported;Feet supported Sitting balance-Leahy Scale: Fair     Standing balance support: During functional activity;No upper extremity supported Standing balance-Leahy Scale: Fair                              Cognition Arousal/Alertness: Awake/alert Behavior During Therapy: WFL for tasks assessed/performed Overall Cognitive Status: No family/caregiver present to determine baseline cognitive functioning                                 General Comments: Pt oriented and following commands. Verbal cues for safety with mobility.      Exercises      General Comments        Pertinent Vitals/Pain Pain Assessment: No/denies pain    Home Living                      Prior Function            PT Goals (current goals can now be found in the care plan section) Acute Rehab PT Goals Patient Stated Goal: to get better Progress towards PT goals: Progressing toward goals    Frequency    Min 3X/week      PT Plan Discharge plan needs to be updated    Co-evaluation              AM-PAC PT "6 Clicks" Mobility   Outcome Measure  Help needed turning from your back to your side while in  a flat bed without using bedrails?: None Help needed moving from lying on your back to sitting on the side of a flat bed without using bedrails?: None Help needed moving to and from a bed to a chair (including a wheelchair)?: A Little Help needed standing up from a chair using your arms (e.g., wheelchair or bedside chair)?: A Little Help needed to walk in hospital room?: A Little Help needed climbing 3-5 steps with a railing? : A Little 6 Click Score: 20    End of Session Equipment Utilized During Treatment: Gait belt Activity Tolerance: Patient tolerated treatment well;Patient limited by fatigue Patient left: in bed;with call bell/phone within reach;with bed alarm set   PT Visit Diagnosis:  Unsteadiness on feet (R26.81);History of falling (Z91.81)     Time: 0923-3007 PT Time Calculation (min) (ACUTE ONLY): 14 min  Charges:  $Gait Training: 8-22 mins                     [place] Pager [phone] [place] 02/24/2021, 5:31 PM

## 2021-02-24 NOTE — TOC Progression Note (Addendum)
Transition of Care [facility]) - Progression Note    Patient Details  Name: MINYON BILLITER MRN: [id number] Date of Birth: Jul 20, 1951  Transition of Care [facility]) CM/SW [place], [place] Phone Number: 02/24/2021, 2:12 PM  Clinical Narrative:   CSW met with patient to discuss that her only bed offer was [facility]. Patient says that's the worst place and she refuses to go, asking if CSW can find somewhere else. CSW discussed difficulty in finding HD beds, but will try. CSW asked [facility], [facility], and [facility] to review referral if they could take any HD patients. CSW to follow.  UPDATE 5:04 PM: CSW received bed offer from [facility], but there is an open car insurance claim that has not been closed, so that will need to be closed before SNF can bill the Medicare. CSW met with patient to discuss, and she said she felt like she moved well enough with therapy to be able to go home. Patient said she'd rather go home than go to SNF. CSW discussed her care at home, and her sister comes over every day and stays most of the day. Patient has a walker, rollator, and cane at home. CSW asked about 3N1, and patient said she didn't think she needed it. Patient said she's had home health again, would be agreeable to that, and has used [facility] before. CSW sent a message to PT to ask about signing off for home. CSW to follow.    Expected Discharge Plan: [place] Barriers to Discharge: SNF Pending bed offer,Insurance Authorization,Continued Medical Work up  Expected Discharge Plan and Services Expected Discharge Plan: [place] Choice: [place] arrangements for the past 2 months: Single Family Home                                       Social Determinants of Health (SDOH) Interventions    Readmission Risk Interventions No flowsheet data found.

## 2021-02-24 NOTE — Progress Notes (Signed)
Height:       62.0 in Accession #:    [id number]        Weight:       119.0 lb Date of Birth:  [birth year]         BSA:          1.533 m Patient Age:    70 years          BP:           124/55 mmHg Patient Gender: F                 HR:           72 bpm. Exam Location:  Inpatient Procedure: Intracardiac Opacification Agent and Limited Echo Indications:    CVA  History:        Patient has prior history of Echocardiogram examinations, most                 recent 02/22/2021. CHF, CAD, Stroke; Risk Factors:Hypertension                 and Dyslipidemia. ESRD.  Sonographer:    Dustin Flock Referring Phys: [id number] Tahj Lindseth  Sonographer Comments: echo done 02/22/21 IMPRESSIONS  1. Limited TTE to exclude LV thrombus. Hypokinesis noted of the apical septum, apical anterior segment, and apex. No LV thrombus. Globally EF is preserved ~60%.  2. Left ventricular ejection fraction, by estimation, is 55 to 60%. The left ventricle has normal function. The left ventricle demonstrates regional wall motion abnormalities (see scoring diagram/findings for description). FINDINGS  Left Ventricle: Left ventricular ejection fraction, by estimation, is 55 to 60%. The left  ventricle has normal function. The left ventricle demonstrates regional wall motion abnormalities. Definity contrast agent was given IV to delineate the left ventricular endocardial borders.  LV Wall Scoring: The apical septal segment, apical anterior segment, and apex are hypokinetic. Eleonore Chiquito MD Electronically signed by Eleonore Chiquito MD Signature Date/Time: 02/23/2021/2:46:19 PM    Final       Scheduled Meds: .  stroke: mapping our early stages of recovery book   Does not apply Once  . aspirin EC  81 mg Oral Daily  . atorvastatin  40 mg Oral Daily  . busPIRone  10 mg Oral BID  . cinacalcet  30 mg Oral Q breakfast  . multivitamin  1 tablet Oral QHS   Continuous Infusions: . [START ON 02/25/2021] sodium chloride    . [START ON 02/25/2021] sodium chloride       LOS: 3 days      Time spent: 25 minutes   Dessa Phi, DO [facility] 02/24/2021, 12:05 PM   Available via Epic secure chat 7am-7pm After these hours, please refer to coverage provider listed on [email or web address]  PROGRESS NOTE    KEYONNA COMUNALE  CWC:[id number] DOB: 14-Apr-1951 DOA: 02/21/2021 PCP: [name], MD     Brief Narrative:  Jabria Loos Hesteris a 70 y.o.femalewith medical history significant forHTN, ESRD on HD, GERD, CAD, HLD who presents as code stroke by EMS from home with complaint of confusion, slurred speech and abnormal gait.She reports that she had a normal dialysis session this afternoon and when she returned home around 6 PM she was feeling fine. She lives at home with her elderly mother who she helps care for. She states around 830 or 9pm, she noticed some slurred speech and some drooping of her left side of her face. She also had faulty walking as she was off balance. She states she did not fall. She denies any chest pain or palpitations. States she has not had any fever, cough, shortness of breath. 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 She did not notice any change in her swallowing. She did not have any seizure activity or syncope. Has history of smoking but quit approximately 30 years ago. Denies alcohol use.  Patient was seen by neurology as a code stroke.  She underwent head CT as well as MRI which were negative for any acute changes. Neurology was consulted.   New events last 24 hours / Subjective: Patient seen in dialysis.  Complaining of some headaches.  She denies any chest pain or worsening shortness of breath.  I discussed patient's previous hospitalization in 2021 where she was seen by cardiology and recommended for further cardiac evaluation.  At that time, she declined work-up.  Today she is still in agreement that she does not want any cardiac work-up at this time.  Assessment & Plan:   Principal Problem:   TIA (transient ischemic attack) Active Problems:   End-stage renal disease on hemodialysis (HCC)   HTN (hypertension)   Hyperlipidemia   Anxiety   Gait instability   Prolonged QT interval   Major depressive disorder,  recurrent episode, moderate (HCC)   TIA -CT head without acute intracranial abnormality -MRI/MRA brain without acute intracranial abnormality, unchanged appearance of multiple chronic microhemorrhages and chronic small vessel disease -Carotid ultrasound negative -Echocardiogram without LV thrombus -EEG  Without seizures or epileptiform discharges seen  -Appreciate neurology -PT rec SNF placement  -Continue aspirin, statin  ESRD -On dialysis MWF -Appreciate nephrology  Hypertension -Allow permissive hypertension.  Coreg and Cozaar on hold for now.  Patient with marginal blood pressure during hemodialysis today  Hyperlipidemia -Continue statin  Chronic diastolic HF -Stable  -Echo showed hypokinesis noted of the apical septum, apical anterior segment, and apex. No LV thrombus.  This was seen in 2021.  At that time, cardiology recommended further work-up which patient declined.  Patient remains chest pain-free today.    DVT prophylaxis:  SCD's Start: 02/22/21 0131  Code Status: Full Family Communication: None at bedside  Disposition Plan:  Status is: Inpatient  Remains inpatient appropriate because:Unsafe d/c plan   Dispo: The patient is from: Home              Anticipated d/c is to: SNF              Patient currently is medically stable to d/c. Await SNF placement.  Repeat Covid test ordered today hopeful discharge 3/15 once insurance auth available   Difficult to place patient No   Antimicrobials:  Anti-infectives (From admission, onward)   None       Objective: Vitals:  Height:       62.0 in Accession #:    [id number]        Weight:       119.0 lb Date of Birth:  [birth year]         BSA:          1.533 m Patient Age:    70 years          BP:           124/55 mmHg Patient Gender: F                 HR:           72 bpm. Exam Location:  Inpatient Procedure: Intracardiac Opacification Agent and Limited Echo Indications:    CVA  History:        Patient has prior history of Echocardiogram examinations, most                 recent 02/22/2021. CHF, CAD, Stroke; Risk Factors:Hypertension                 and Dyslipidemia. ESRD.  Sonographer:    Dustin Flock Referring Phys: [id number] Tahj Lindseth  Sonographer Comments: echo done 02/22/21 IMPRESSIONS  1. Limited TTE to exclude LV thrombus. Hypokinesis noted of the apical septum, apical anterior segment, and apex. No LV thrombus. Globally EF is preserved ~60%.  2. Left ventricular ejection fraction, by estimation, is 55 to 60%. The left ventricle has normal function. The left ventricle demonstrates regional wall motion abnormalities (see scoring diagram/findings for description). FINDINGS  Left Ventricle: Left ventricular ejection fraction, by estimation, is 55 to 60%. The left  ventricle has normal function. The left ventricle demonstrates regional wall motion abnormalities. Definity contrast agent was given IV to delineate the left ventricular endocardial borders.  LV Wall Scoring: The apical septal segment, apical anterior segment, and apex are hypokinetic. Eleonore Chiquito MD Electronically signed by Eleonore Chiquito MD Signature Date/Time: 02/23/2021/2:46:19 PM    Final       Scheduled Meds: .  stroke: mapping our early stages of recovery book   Does not apply Once  . aspirin EC  81 mg Oral Daily  . atorvastatin  40 mg Oral Daily  . busPIRone  10 mg Oral BID  . cinacalcet  30 mg Oral Q breakfast  . multivitamin  1 tablet Oral QHS   Continuous Infusions: . [START ON 02/25/2021] sodium chloride    . [START ON 02/25/2021] sodium chloride       LOS: 3 days      Time spent: 25 minutes   Dessa Phi, DO [facility] 02/24/2021, 12:05 PM   Available via Epic secure chat 7am-7pm After these hours, please refer to coverage provider listed on [email or web address]  PROGRESS NOTE    KEYONNA COMUNALE  CWC:[id number] DOB: 14-Apr-1951 DOA: 02/21/2021 PCP: [name], MD     Brief Narrative:  Jabria Loos Hesteris a 70 y.o.femalewith medical history significant forHTN, ESRD on HD, GERD, CAD, HLD who presents as code stroke by EMS from home with complaint of confusion, slurred speech and abnormal gait.She reports that she had a normal dialysis session this afternoon and when she returned home around 6 PM she was feeling fine. She lives at home with her elderly mother who she helps care for. She states around 830 or 9pm, she noticed some slurred speech and some drooping of her left side of her face. She also had faulty walking as she was off balance. She states she did not fall. She denies any chest pain or palpitations. States she has not had any fever, cough, shortness of breath. 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 She did not notice any change in her swallowing. She did not have any seizure activity or syncope. Has history of smoking but quit approximately 30 years ago. Denies alcohol use.  Patient was seen by neurology as a code stroke.  She underwent head CT as well as MRI which were negative for any acute changes. Neurology was consulted.   New events last 24 hours / Subjective: Patient seen in dialysis.  Complaining of some headaches.  She denies any chest pain or worsening shortness of breath.  I discussed patient's previous hospitalization in 2021 where she was seen by cardiology and recommended for further cardiac evaluation.  At that time, she declined work-up.  Today she is still in agreement that she does not want any cardiac work-up at this time.  Assessment & Plan:   Principal Problem:   TIA (transient ischemic attack) Active Problems:   End-stage renal disease on hemodialysis (HCC)   HTN (hypertension)   Hyperlipidemia   Anxiety   Gait instability   Prolonged QT interval   Major depressive disorder,  recurrent episode, moderate (HCC)   TIA -CT head without acute intracranial abnormality -MRI/MRA brain without acute intracranial abnormality, unchanged appearance of multiple chronic microhemorrhages and chronic small vessel disease -Carotid ultrasound negative -Echocardiogram without LV thrombus -EEG  Without seizures or epileptiform discharges seen  -Appreciate neurology -PT rec SNF placement  -Continue aspirin, statin  ESRD -On dialysis MWF -Appreciate nephrology  Hypertension -Allow permissive hypertension.  Coreg and Cozaar on hold for now.  Patient with marginal blood pressure during hemodialysis today  Hyperlipidemia -Continue statin  Chronic diastolic HF -Stable  -Echo showed hypokinesis noted of the apical septum, apical anterior segment, and apex. No LV thrombus.  This was seen in 2021.  At that time, cardiology recommended further work-up which patient declined.  Patient remains chest pain-free today.    DVT prophylaxis:  SCD's Start: 02/22/21 0131  Code Status: Full Family Communication: None at bedside  Disposition Plan:  Status is: Inpatient  Remains inpatient appropriate because:Unsafe d/c plan   Dispo: The patient is from: Home              Anticipated d/c is to: SNF              Patient currently is medically stable to d/c. Await SNF placement.  Repeat Covid test ordered today hopeful discharge 3/15 once insurance auth available   Difficult to place patient No   Antimicrobials:  Anti-infectives (From admission, onward)   None       Objective: Vitals:

## 2021-02-24 NOTE — Progress Notes (Signed)
OT Cancellation Note  Patient Details Name: KATERA RYBKA MRN: [id number] DOB: November 02, 1951   Cancelled Treatment:    Reason Eval/Treat Not Completed: Patient at procedure or test/ unavailable- pt off unit at HD. Will follow and see as able.   Jolaine Artist, OT Acute Rehabilitation Services Pager [phone] Office [phone]   Delight Stare 02/24/2021, 8:29 AM

## 2021-02-24 NOTE — Evaluation (Addendum)
Occupational Therapy Evaluation Patient Details Name: [name] MRN: [id number] DOB: Jul 31, 1951 Today's Date: 02/24/2021    History of Present Illness Pt is a 70 y/o female with PMH of HTN, ESRD on HD, CAD, HLD, presents as code stroke by EMS with confusion, slurred speech and abnormal gait. CT and MRI negative.   Clinical Impression   Patient supine in bed and agreeable to OT evaluation.  Patient reports living with her mother, whom her sister takes care of.  Patient independent with ADLs, has assist for IADLs and uses RW most of the time for mobility. Admitted for above and limited by problem list below including impaired problem solving and awareness, decreased activity tolerance, generalized weakness and impaired balance. Patient currently demonstrates ability to complete transfers with min assist, bed mobility with supervision, and ADLs with up to min assist.  Noted limited eval due to patient requesting to eat lunch. She will benefit from further OT services while admitted and after dc to optimize independence, safety and return to PLOF with ADLs/mobility.  Recommend HHOT services if she has 24/7 support from family, but if not recommend SNF services.     Follow Up Recommendations  Home health OT;Supervision/Assistance - 24 hour;SNF (SNF if unable to have 24/7 support)    Equipment Recommendations  3 in 1 bedside commode    Recommendations for Other Services       Precautions / Restrictions Precautions Precautions: Fall Restrictions Weight Bearing Restrictions: No      Mobility Bed Mobility Overal bed mobility: Needs Assistance Bed Mobility: Supine to Sit;Sit to Supine     Supine to sit: Min guard;HOB elevated Sit to supine: Min guard   General bed mobility comments: min guard for safety, no physical assist required    Transfers Overall transfer level: Needs assistance   Transfers: Sit to/from Stand Sit to Stand: Min assist         General transfer  comment: min assist to steady coming to standing, side stepping to [facility] with hand held support    Balance Overall balance assessment: Needs assistance Sitting-balance support: No upper extremity supported;Feet supported Sitting balance-Leahy Scale: Fair     Standing balance support: During functional activity;Single extremity supported Standing balance-Leahy Scale: Fair Standing balance comment: preference to UE support                           ADL either performed or assessed with clinical judgement   ADL Overall ADL's : Needs assistance/impaired     Grooming: Set up;Sitting   Upper Body Bathing: Set up;Sitting   Lower Body Bathing: Minimal assistance;Sit to/from stand   Upper Body Dressing : Set up;Sitting   Lower Body Dressing: Minimal assistance;Sit to/from stand   Toilet Transfer: Magazine features editor Details (indicate cue type and reason): simulated side stepping towards HOB         Functional mobility during ADLs: Minimal assistance (HHA) General ADL Comments: patient limited by weakness, balance, activity tolerance     Vision   Additional Comments: appears [facility]     Perception     Praxis      Pertinent Vitals/Pain Pain Assessment: No/denies pain     Hand Dominance Right   Extremity/Trunk Assessment Upper Extremity Assessment Upper Extremity Assessment: Generalized weakness   Lower Extremity Assessment Lower Extremity Assessment: Defer to PT evaluation       Communication Communication Communication: No difficulties   Cognition Arousal/Alertness: Awake/alert Behavior During Therapy: [facility] for  tasks assessed/performed Overall Cognitive Status: No family/caregiver present to determine baseline cognitive functioning Area of Impairment: Following commands;Safety/judgement;Awareness;Problem solving                       Following Commands: Follows one step commands consistently;Follows one step commands with increased  time Safety/Judgement: Decreased awareness of safety;Decreased awareness of deficits Awareness: Emergent Problem Solving: Slow processing;Difficulty sequencing;Requires verbal cues General Comments: patient oriented x 4, follows simple commands and engages approrpaitely.  Question problem sovling and awareness, requiring verbal cueing for safety and seqnecning.   General Comments  VSS, limited eval as patient requests to eat lunch    Exercises     Shoulder Instructions      Home Living Family/patient expects to be discharged to:: Private residence Living Arrangements: Parent Available Help at Discharge: Family (mother) Type of Home: House Home Access: Stairs to enter Technical brewer of Steps: 3 STE Entrance Stairs-Rails: Left Home Layout: One level     Bathroom Shower/Tub: Tub/shower unit;Walk-in shower   Bathroom Toilet: Standard Bathroom Accessibility: Yes   Home Equipment: Shower seat;Cane - single point;Walker - 2 wheels   Additional Comments: pts sister takes care of her mother, pt reports her sister can provide her assist as needed as well      Prior Functioning/Environment Level of Independence: Independent with assistive device(s)        Comments: uses RW for mobility, some without; meals on wheels, independent ADLs        OT Problem List: Decreased strength;Decreased activity tolerance;Impaired balance (sitting and/or standing);Decreased cognition;Decreased safety awareness;Decreased knowledge of use of DME or AE;Decreased knowledge of precautions      OT Treatment/Interventions: Self-care/ADL training;Energy conservation;DME and/or AE instruction;Therapeutic activities;Cognitive remediation/compensation;Patient/family education;Balance training;Therapeutic exercise    OT Goals(Current goals can be found in the care plan section) Acute Rehab OT Goals Patient Stated Goal: to get better OT Goal Formulation: With patient Time For Goal Achievement:  03/10/21 Potential to Achieve Goals: Good  OT Frequency: Min 2X/week   Barriers to D/C:            Co-evaluation              AM-PAC OT "6 Clicks" Daily Activity     Outcome Measure Help from another person eating meals?: A Little Help from another person taking care of personal grooming?: A Little Help from another person toileting, which includes using toliet, bedpan, or urinal?: A Little Help from another person bathing (including washing, rinsing, drying)?: A Little Help from another person to put on and taking off regular upper body clothing?: A Little Help from another person to put on and taking off regular lower body clothing?: A Little 6 Click Score: 18   End of Session Nurse Communication: Mobility status  Activity Tolerance: Patient tolerated treatment well Patient left: in bed;with call bell/phone within reach;with bed alarm set  OT Visit Diagnosis: Other abnormalities of gait and mobility (R26.89);Muscle weakness (generalized) (M62.81);Other symptoms and signs involving cognitive function                Time: 1340-1357 OT Time Calculation (min): 17 min Charges:  OT General Charges $OT Visit: 1 Visit OT Evaluation $OT Eval Moderate Complexity: 1 Mod  Jolaine Artist, OT Acute Rehabilitation Services Pager [phone] Office [phone]   Delight Stare 02/24/2021, 4:00 PM

## 2021-02-24 NOTE — Progress Notes (Signed)
PT Cancellation Note  Patient Details Name: [name] MRN: [id number] DOB: September 12, 1951   Cancelled Treatment:    Reason Eval/Treat Not Completed: Patient at procedure or test/unavailable. Pt in HD. Will continue to follow.   Anderson 02/24/2021, 10:57 AM [place] Pager [phone] Office [phone]

## 2021-02-24 NOTE — Progress Notes (Signed)
[facility] ROUNDING NOTE   Subjective:    Brief history: 70 year old end-stage renal disease Monday Wednesday Friday, hypertension hyperlipidemia coronary disease prior CVA admitted with TIA symptoms.  Usual dialysis Monday Wednesday Friday [facility] last dialysis was 02/21/2021.  Blood pressure 01/08/1957 pulse 69 temperature 97.8 O2 sats 98% room air  Sodium 137 potassium 5.3 chloride 95 CO2 26 BUN 68 creatinine 8.69 glucose 91 calcium 8.4 hemoglobin 11.4  Aspirin 81 mg daily, Lipitor 40 mg daily BuSpar 10 mg twice daily cinacalcet 30 mg daily multivitamins 1 daily  Objective:  Vital signs in last 24 hours:  Temp:  [97.8 F (36.6 C)-98.3 F (36.8 C)] 97.8 F (36.6 C) (03/14 0345) Pulse Rate:  [69-78] 69 (03/14 0345) Resp:  [16-18] 18 (03/14 0345) BP: (103-131)/(37-80) 125/57 (03/14 0345) SpO2:  [90 %-100 %] 98 % (03/14 0345)  Weight change:  Filed Weights   02/22/21 1754  Weight: 54 kg    Intake/Output: I/O last 3 completed shifts: In: 358 [P.O.:358] Out: -    Intake/Output this shift:  No intake/output data recorded.  General: Frail appearing, nad  Head: NCAT sclera not icteric Neck: Supple. No JVD appreciated  Lungs: Clear bilaterally, no rales, wheeze  Heart: RRR with S1 S2 Abdomen: soft non-tender  Lower extremities:without edema or ischemic changes, no open wounds  Neuro: A & O  X 3. Moves all extremities spontaneously. Psych:  Responds to questions appropriately with a normal affect. Dialysis Access: RUE AVG +bruit   Basic Metabolic Panel: Recent Labs  Lab 02/21/21 2154 02/21/21 2157 02/23/21 0406 02/24/21 0229  NA 143 142 135 137  K 4.2 4.1 5.8* 5.3*  CL 98 100 95* 95*  CO2 34*  --  24 26  GLUCOSE 100* 100* 75 91  BUN 19 25* 51* 68*  CREATININE 3.73* 3.80* 6.52* 8.69*  CALCIUM 9.4  --  9.0 8.4*    Liver Function Tests: Recent Labs  Lab 02/21/21 2154  AST 22  ALT 10  ALKPHOS 110  BILITOT 0.8  PROT 7.3   ALBUMIN 3.5   No results for input(s): LIPASE, AMYLASE in the last 168 hours. No results for input(s): AMMONIA in the last 168 hours.  CBC: Recent Labs  Lab 02/21/21 2154 02/21/21 2157  WBC 5.9  --   NEUTROABS 3.3  --   HGB 11.4* 12.2  HCT 36.7 36.0  MCV 98.1  --   PLT 213  --     Cardiac Enzymes: No results for input(s): CKTOTAL, CKMB, CKMBINDEX, TROPONINI in the last 168 hours.  BNP: Invalid input(s): POCBNP  CBG: Recent Labs  Lab 02/21/21 2155  GLUCAP 84    Microbiology: Results for orders placed or performed during the hospital encounter of 02/21/21  Resp Panel by RT-PCR (Flu A&B, Covid) Nasopharyngeal Swab     Status: None   Collection Time: 02/21/21 10:51 PM   Specimen: Nasopharyngeal Swab; Nasopharyngeal(NP) swabs in vial transport medium  Result Value Ref Range Status   SARS Coronavirus 2 by RT PCR NEGATIVE NEGATIVE Final    Comment: (NOTE) SARS-CoV-2 target nucleic acids are NOT DETECTED.  The SARS-CoV-2 RNA is generally detectable in upper respiratory specimens during the acute phase of infection. The lowest concentration of SARS-CoV-2 viral copies this assay can detect is 138 copies/mL. A negative result does not preclude SARS-Cov-2 infection and should not be used as the sole basis for treatment or other patient management decisions. A negative result may occur with  improper specimen collection/handling,  submission of specimen other than nasopharyngeal swab, presence of viral mutation(s) within the areas targeted by this assay, and inadequate number of viral copies(<138 copies/mL). A negative result must be combined with clinical observations, patient history, and epidemiological information. The expected result is Negative.  Fact Sheet for Patients:  EntrepreneurPulse.com.au  Fact Sheet for Healthcare Providers:  IncredibleEmployment.be  This test is no t yet approved or cleared by the [place] FDA and   has been authorized for detection and/or diagnosis of SARS-CoV-2 by FDA under an Emergency Use Authorization (EUA). This EUA will remain  in effect (meaning this test can be used) for the duration of the COVID-19 declaration under Section 564(b)(1) of the Act, 21 U.S.C.section 360bbb-3(b)(1), unless the authorization is terminated  or revoked sooner.       Influenza A by PCR NEGATIVE NEGATIVE Final   Influenza B by PCR NEGATIVE NEGATIVE Final    Comment: (NOTE) The Xpert Xpress SARS-CoV-2/FLU/RSV plus assay is intended as an aid in the diagnosis of influenza from Nasopharyngeal swab specimens and should not be used as a sole basis for treatment. Nasal washings and aspirates are unacceptable for Xpert Xpress SARS-CoV-2/FLU/RSV testing.  Fact Sheet for Patients: EntrepreneurPulse.com.au  Fact Sheet for Healthcare Providers: IncredibleEmployment.be  This test is not yet approved or cleared by the [place] FDA and has been authorized for detection and/or diagnosis of SARS-CoV-2 by FDA under an Emergency Use Authorization (EUA). This EUA will remain in effect (meaning this test can be used) for the duration of the COVID-19 declaration under Section 564(b)(1) of the Act, 21 U.S.C. section 360bbb-3(b)(1), unless the authorization is terminated or revoked.  Performed at [facility], [place] [address], [place], [place] [address]     Coagulation Studies: Recent Labs    02/21/21 2154  LABPROT 14.7  INR 1.2    Urinalysis: No results for input(s): COLORURINE, LABSPEC, PHURINE, GLUCOSEU, HGBUR, BILIRUBINUR, KETONESUR, PROTEINUR, UROBILINOGEN, NITRITE, LEUKOCYTESUR in the last 72 hours.  Invalid input(s): APPERANCEUR    Imaging: EEG adult  Result Date: 02/22/2021 Lora Havens, MD     02/22/2021  5:23 PM Patient Name: ALLEX MADIA MRN: [id number] Epilepsy Attending: Lora Havens Referring Physician/Provider: Dr Lestine Mount Date: 02/22/2021 Duration: 27.38 mins Patient history:  70 y.o. 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 EEG to evaluate for seizure Level of alertness: Awake AEDs during EEG study: None Technical aspects: This EEG study was done with scalp electrodes positioned according to the 10-20 International system of electrode placement. Electrical activity was acquired at a sampling rate of 500Hz  and reviewed with a high frequency filter of 70Hz  and a low frequency filter of 1Hz . EEG data were recorded continuously and digitally stored. Description: The posterior dominant rhythm consists of 8-9 Hz activity of moderate voltage (25-35 uV) seen predominantly in posterior head regions, symmetric and reactive to eye opening and eye closing. Hyperventilation and photic stimulation were not performed.   IMPRESSION: This study is within normal limits. No seizures or epileptiform discharges were seen throughout the recording. Lora Havens   ECHOCARDIOGRAM COMPLETE  Result Date: 02/22/2021    ECHOCARDIOGRAM REPORT   Patient Name:   CHAUNTE HORNBECK Date of Exam: 02/22/2021 Medical Rec #:  [id number]         Height:       62.0 in Accession #:    [id number]  Weight:       115.8 lb Date of Birth:  Apr 28, 1951         BSA:          1.516 m Patient Age:    79 years          BP:           117/51 mmHg Patient Gender: F                 HR:           74 bpm. Exam Location:  Inpatient Procedure: 2D Echo Indications:    stroke  History:        Patient has prior history of Echocardiogram examinations, most                 recent 05/14/2020. End stage renal disese and Stroke; Risk                 Factors:Hypertension and Dyslipidemia.  Sonographer:    Johny Chess Referring Phys: [id number] [place]  1. No LV thrombus noted on this non-contrast study. Would recommend limited contrast study given  anteroapical/apex hypokinesis. Grade 2 DD is present. Moderately elevated RVSP.  2. Left ventricular ejection fraction, by estimation, is 60 to 65%. The left ventricle has normal function. The left ventricle demonstrates regional wall motion abnormalities (see scoring diagram/findings for description). Left ventricular diastolic parameters are consistent with Grade II diastolic dysfunction (pseudonormalization). Elevated left atrial pressure.  3. Right ventricular systolic function is normal. The right ventricular size is moderately enlarged. There is moderately elevated pulmonary artery systolic pressure. The estimated right ventricular systolic pressure is 53.2 mmHg.  4. Left atrial size was moderately dilated.  5. The mitral valve is degenerative. Mild mitral valve regurgitation. No evidence of mitral stenosis.  6. Tricuspid valve regurgitation is moderate.  7. The aortic valve is tricuspid. There is mild calcification of the aortic valve. There is mild thickening of the aortic valve. Aortic valve regurgitation is not visualized. Mild aortic valve sclerosis is present, with no evidence of aortic valve stenosis.  8. The inferior vena cava is normal in size with greater than 50% respiratory variability, suggesting right atrial pressure of 3 mmHg. Comparison(s): No prior Echocardiogram. FINDINGS  Left Ventricle: Left ventricular ejection fraction, by estimation, is 60 to 65%. The left ventricle has normal function. The left ventricle demonstrates regional wall motion abnormalities. The left ventricular internal cavity size was normal in size. There is no left ventricular hypertrophy. Left ventricular diastolic parameters are consistent with Grade II diastolic dysfunction (pseudonormalization). Elevated left atrial pressure.  LV Wall Scoring: The apical anterior segment and apex are hypokinetic. Right Ventricle: The right ventricular size is moderately enlarged. No increase in right ventricular wall thickness. Right  ventricular systolic function is normal. There is moderately elevated pulmonary artery systolic pressure. The tricuspid regurgitant  velocity is 3.85 m/s, and with an assumed right atrial pressure of 3 mmHg, the estimated right ventricular systolic pressure is 99.2 mmHg. Left Atrium: Left atrial size was moderately dilated. Right Atrium: Right atrial size was normal in size. Pericardium: Trivial pericardial effusion is present. Mitral Valve: The mitral valve is degenerative in appearance. Mild mitral annular calcification. Mild mitral valve regurgitation. No evidence of mitral valve stenosis. Tricuspid Valve: The tricuspid valve is grossly normal. Tricuspid valve regurgitation is moderate . No evidence of tricuspid stenosis. Aortic Valve: The aortic valve is tricuspid. There is mild calcification of the aortic valve. There is  mild thickening of the aortic valve. Aortic valve regurgitation is not visualized. Mild aortic valve sclerosis is present, with no evidence of aortic valve stenosis. Aortic valve mean gradient measures 5.9 mmHg. Aortic valve peak gradient measures 12.6 mmHg. Aortic valve area, by VTI measures 2.35 cm. Pulmonic Valve: The pulmonic valve was grossly normal. Pulmonic valve regurgitation is not visualized. No evidence of pulmonic stenosis. Aorta: The aortic root and ascending aorta are structurally normal, with no evidence of dilitation. Venous: The inferior vena cava is normal in size with greater than 50% respiratory variability, suggesting right atrial pressure of 3 mmHg. IAS/Shunts: The atrial septum is grossly normal.  LEFT VENTRICLE PLAX 2D LVIDd:         3.90 cm     Diastology LVIDs:         2.50 cm     LV e' medial:    5.98 cm/s LV PW:         0.90 cm     LV E/e' medial:  27.8 LV IVS:        1.00 cm     LV e' lateral:   8.05 cm/s LVOT diam:     1.90 cm     LV E/e' lateral: 20.6 LV SV:         83 LV SV Index:   55 LVOT Area:     2.84 cm  LV Volumes (MOD) LV vol d, MOD A2C: 76.9 ml LV vol  s, MOD A2C: 28.1 ml LV SV MOD A2C:     48.8 ml RIGHT VENTRICLE             IVC RV S prime:     11.00 cm/s  IVC diam: 1.70 cm TAPSE (M-mode): 2.2 cm LEFT ATRIUM             Index       RIGHT ATRIUM           Index LA diam:        3.80 cm 2.51 cm/m  RA Area:     17.60 cm LA Vol (A2C):   69.0 ml 45.53 ml/m RA Volume:   45.70 ml  30.15 ml/m LA Vol (A4C):   66.1 ml 43.62 ml/m LA Biplane Vol: 69.9 ml 46.12 ml/m  AORTIC VALVE AV Area (Vmax):    2.43 cm AV Area (Vmean):   2.48 cm AV Area (VTI):     2.35 cm AV Vmax:           177.69 cm/s AV Vmean:          113.109 cm/s AV VTI:            0.355 m AV Peak Grad:      12.6 mmHg AV Mean Grad:      5.9 mmHg LVOT Vmax:         152.00 cm/s LVOT Vmean:        98.800 cm/s LVOT VTI:          0.294 m LVOT/AV VTI ratio: 0.83  AORTA Ao Root diam: 3.00 cm Ao Asc diam:  3.80 cm MITRAL VALVE                 TRICUSPID VALVE MV Area (PHT): 4.21 cm      TR Peak grad:   59.3 mmHg MV Decel Time: 180 msec      TR Vmax:        385.00 cm/s MR Peak grad:    123.7 mmHg MR Mean  grad:    96.0 mmHg   SHUNTS MR Vmax:         556.00 cm/s Systemic VTI:  0.29 m MR Vmean:        476.0 cm/s  Systemic Diam: 1.90 cm MR PISA:         1.01 cm MR PISA Eff ROA: 8 mm MR PISA Radius:  0.40 cm MV E velocity: 166.00 cm/s MV A velocity: 95.60 cm/s MV E/A ratio:  1.74 Eleonore Chiquito MD Electronically signed by Eleonore Chiquito MD Signature Date/Time: 02/22/2021/1:16:17 PM    Final    VAS US CAROTID (at [facility] and [facility] only)  Result Date: 02/22/2021 Carotid Arterial Duplex Study Indications:       Speech disturbance, Weakness and irregular gait. Risk Factors:      Hypertension, hyperlipidemia, past history of smoking,                    coronary artery disease. Other Factors:     ESRD on dialysis. Comparison Study:  Prior normal carotid duplex done 02/22/18 Performing Technologist: Sharion Dove RVS  Examination Guidelines: A complete evaluation includes B-mode imaging, spectral Doppler, color Doppler, and power  Doppler as needed of all accessible portions of each vessel. Bilateral testing is considered an integral part of a complete examination. Limited examinations for reoccurring indications may be performed as noted.  Right Carotid Findings: +----------+--------+--------+--------+------------------+---------+           PSV cm/sEDV cm/sStenosisPlaque DescriptionComments  +----------+--------+--------+--------+------------------+---------+ CCA Prox  88      11                                          +----------+--------+--------+--------+------------------+---------+ CCA Distal75      6                                           +----------+--------+--------+--------+------------------+---------+ ICA Prox  55      8               heterogenous      Shadowing +----------+--------+--------+--------+------------------+---------+ ICA Distal70      13                                          +----------+--------+--------+--------+------------------+---------+ ECA       196     9               calcific                    +----------+--------+--------+--------+------------------+---------+ +----------+--------+-------+--------+-------------------+           PSV cm/sEDV cmsDescribeArm Pressure (mmHG) +----------+--------+-------+--------+-------------------+ [id number]                                        +----------+--------+-------+--------+-------------------+ +---------+--------+--+--------+--+ VertebralPSV cm/s95EDV cm/s20 +---------+--------+--+--------+--+  Left Carotid Findings: +----------+--------+--------+--------+------------------+------------------+           PSV cm/sEDV cm/sStenosisPlaque DescriptionComments           +----------+--------+--------+--------+------------------+------------------+ CCA Prox  103     8  intimal thickening  +----------+--------+--------+--------+------------------+------------------+ CCA Distal69      8                                 intimal thickening +----------+--------+--------+--------+------------------+------------------+ ICA Prox  59      12              heterogenous                         +----------+--------+--------+--------+------------------+------------------+ ICA Distal60      15                                                   +----------+--------+--------+--------+------------------+------------------+ ECA       62      12                                                   +----------+--------+--------+--------+------------------+------------------+ +----------+--------+--------+--------+-------------------+           PSV cm/sEDV cm/sDescribeArm Pressure (mmHG) +----------+--------+--------+--------+-------------------+ Subclavian117                                         +----------+--------+--------+--------+-------------------+ +---------+--------+--+--------+-+ VertebralPSV cm/s44EDV cm/s5 +---------+--------+--+--------+-+   Summary: Right Carotid: The extracranial vessels were near-normal with only minimal wall                thickening or plaque. Left Carotid: The extracranial vessels were near-normal with only minimal wall               thickening or plaque. Vertebrals:  Bilateral vertebral arteries demonstrate antegrade flow. Subclavians: Normal flow hemodynamics were seen in bilateral subclavian              arteries. *See table(s) above for measurements and observations.  Electronically signed by Antony Contras MD on 02/22/2021 at 12:51:37 PM.    Final    ECHOCARDIOGRAM LIMITED  Result Date: 02/23/2021    ECHOCARDIOGRAM LIMITED REPORT   Patient Name:   MARJO GROSVENOR Date of Exam: 02/23/2021 Medical Rec #:  [id number]         Height:       62.0 in Accession #:    [id number]        Weight:       119.0 lb Date of Birth:  [birth year]         BSA:           1.533 m Patient Age:    11 years          BP:           124/55 mmHg Patient Gender: F                 HR:           72 bpm. Exam Location:  Inpatient Procedure: Intracardiac Opacification Agent and Limited Echo Indications:    CVA  History:        Patient has prior history of Echocardiogram examinations, most  recent 02/22/2021. CHF, CAD, Stroke; Risk Factors:Hypertension                 and Dyslipidemia. ESRD.  Sonographer:    Dustin Flock Referring Phys: [id number] JENNIFER CHOI  Sonographer Comments: echo done 02/22/21 IMPRESSIONS  1. Limited TTE to exclude LV thrombus. Hypokinesis noted of the apical septum, apical anterior segment, and apex. No LV thrombus. Globally EF is preserved ~60%.  2. Left ventricular ejection fraction, by estimation, is 55 to 60%. The left ventricle has normal function. The left ventricle demonstrates regional wall motion abnormalities (see scoring diagram/findings for description). FINDINGS  Left Ventricle: Left ventricular ejection fraction, by estimation, is 55 to 60%. The left ventricle has normal function. The left ventricle demonstrates regional wall motion abnormalities. Definity contrast agent was given IV to delineate the left ventricular endocardial borders.  LV Wall Scoring: The apical septal segment, apical anterior segment, and apex are hypokinetic. Eleonore Chiquito MD Electronically signed by Eleonore Chiquito MD Signature Date/Time: 02/23/2021/2:46:19 PM    Final      Medications:    .  stroke: mapping our early stages of recovery book   Does not apply Once  . aspirin EC  81 mg Oral Daily  . atorvastatin  40 mg Oral Daily  . busPIRone  10 mg Oral BID  . cinacalcet  30 mg Oral Q breakfast  . multivitamin  1 tablet Oral QHS   acetaminophen **OR** acetaminophen (TYLENOL) oral liquid 160 mg/5 mL **OR** acetaminophen, ALPRAZolam  Assessment/ Plan:   Dialysis Orders:  [facility] MWF 4h 350/500 EDW 49kg 2K/2Ca UFP 2  AVG No heparin  Hectorol 2 TIW    Assessment/Plan: 1.  TIA - Hx prior CVA. No acute findings on MRI. EEG neg. SNF placement recommended.  Per neuro/primary  2.  ESRD -  HD MWF. Next HD Monday 02/24/2021 3.  Hypertension/volume  -BP controlled. No gross volume on exam. UF to EDW as tolerated  4.  Anemia  - Hgb 12. No ESA needs  5.  Metabolic bone disease -  Continue VDRA, Sensipar,Auryixa binder.  6.  Nutrition - Renal diet with fluid restriction.      LOS: [place] @TODAY @7 :18 AM

## 2021-02-25 ENCOUNTER — Other Ambulatory Visit: Payer: Self-pay | Admitting: Internal Medicine

## 2021-02-25 LAB — BASIC METABOLIC PANEL
Anion gap: 12 (ref 5–15)
BUN: 31 mg/dL — ABNORMAL HIGH (ref 8–23)
CO2: 27 mmol/L (ref 22–32)
Calcium: 8.7 mg/dL — ABNORMAL LOW (ref 8.9–10.3)
Chloride: 96 mmol/L — ABNORMAL LOW (ref 98–111)
Creatinine, Ser: 5.16 mg/dL — ABNORMAL HIGH (ref 0.44–1.00)
GFR, Estimated: 8 mL/min — ABNORMAL LOW (ref >60)
Glucose, Bld: 85 mg/dL (ref 70–99)
Potassium: 4.2 mmol/L (ref 3.5–5.1)
Sodium: 135 mmol/L (ref 135–145)

## 2021-02-25 MED ORDER — BUSPIRONE HCL 10 MG PO TABS: 10.0000 mg | ORAL_TABLET | Freq: Two times a day (BID) | ORAL | 0 refills | Status: AC

## 2021-02-25 MED ORDER — ATORVASTATIN CALCIUM 80 MG PO TABS: 80.0000 mg | ORAL_TABLET | Freq: Every day | ORAL | 0 refills | Status: DC

## 2021-02-25 MED ORDER — CHLORHEXIDINE GLUCONATE CLOTH 2 % EX PADS: 6.0000 | MEDICATED_PAD | Freq: Every day | CUTANEOUS | Status: DC

## 2021-02-25 NOTE — Discharge Summary (Signed)
Physician Discharge Summary  [name]:[id number] DOB: 02/21/1951 DOA: 02/21/2021  PCP: [name], MD  Admit date: 02/21/2021 Discharge date: 02/25/2021  Admitted From: Home Disposition:  Home  Recommendations for Outpatient Follow-up:  1. Follow up with PCP in 1 week 2. HD MWF  Discharge Condition: Stable CODE STATUS: Full  Diet recommendation: Renal   Brief/Interim Summary: Kathryn Pfefferkorn Hesteris a 70 y.o.femalewith medical history significant forHTN, ESRD on HD, GERD, CAD, HLD who presents as code stroke by EMS from home with complaint of confusion, slurred speech and abnormal gait.Shereports that she had a normal dialysis session this afternoon and when she returned home around 6 PM she was feeling fine.She livesat home with her elderly mother who she helps care for. She states around 830 or 9pm,she noticed some slurred speech and some drooping of her left side of her face. She also had faulty walking as she was off balance. She states she did not fall. She denies any chest pain or palpitations. States she has not had any fever, cough, shortness of breath. 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 She did not notice any change in her swallowing. She did not have any seizure activity or syncope. Has history of smoking but quit approximately 30 years ago. Denies alcohol use.Patient was seen by neurology as a code stroke. She underwent head CT as well as MRI which were negative for any acute changes. Neurology was consulted.   Patient was started on aspirin and statin.  She underwent further neurology work-up as below which were largely unremarkable.  She underwent hemodialysis as scheduled during hospitalization.   Discharge Diagnoses:  Principal Problem:   TIA (transient ischemic attack) Active Problems:   End-stage renal disease on hemodialysis (HCC)   HTN (hypertension)   Hyperlipidemia   Anxiety   Gait instability    Prolonged QT interval   Major depressive disorder, recurrent episode, moderate (HCC)   TIA -CT head without acute intracranial abnormality -MRI/MRA brain without acute intracranial abnormality, unchanged appearance of multiple chronic microhemorrhages and chronic small vessel disease -Carotid ultrasound negative -Echocardiogram without LV thrombus -EEG  Without seizures or epileptiform discharges seen  -Appreciate neurology -Continue aspirin, statin  ESRD -On dialysis MWF -Appreciate nephrology  Hypertension -Allow permissive hypertension.Resume Coreg and Cozaar  Hyperlipidemia -Continue statin  Chronic diastolic HF -Stable  -Echo showed hypokinesis noted of the apical septum, apical anterior segment, and apex. No LV thrombus.  This was seen in 2021.  At that time, cardiology recommended further work-up which patient declined.  Patient remains chest pain-free today.  Depression -Appreciate psychology.  Start BuSpar, consider referral to outpatient psychiatric clinic   Discharge Instructions  Discharge Instructions    Ambulatory referral to Neurology   Complete by: As directed    An appointment is requested in approximately: 6 weeks   Call MD for:  difficulty breathing, headache or visual disturbances   Complete by: As directed    Call MD for:  extreme fatigue   Complete by: As directed    Call MD for:  persistant dizziness or light-headedness   Complete by: As directed    Call MD for:  persistant nausea and vomiting   Complete by: As directed    Call MD for:  severe uncontrolled pain   Complete by: As directed    Call MD for:  temperature >100.4   Complete by: As directed    Discharge instructions   Complete by: As directed  You were cared for by a hospitalist during your hospital stay. If you have any questions about your discharge medications or the care you received while you were in the hospital after you are discharged, you can call the unit and ask to  speak with the hospitalist on call if the hospitalist that took care of you is not available. Once you are discharged, your primary care physician will handle any further medical issues. Please note that NO REFILLS for any discharge medications will be authorized once you are discharged, as it is imperative that you return to your primary care physician (or establish a relationship with a primary care physician if you do not have one) for your aftercare needs so that they can reassess your need for medications and monitor your lab values.   Increase activity slowly   Complete by: As directed      Allergies as of 02/25/2021      Reactions   Sulfa Antibiotics Other (See Comments)   Per patient, both parents allergic-so will not take   Adhesive [tape] Itching      Medication List    TAKE these medications   acetaminophen 500 MG tablet Commonly known as: TYLENOL Take 1 tablet (500 mg total) by mouth every 6 (six) hours as needed.   allopurinol 300 MG tablet Commonly known as: ZYLOPRIM Take 150 mg by mouth daily.   ALPRAZolam 0.25 MG tablet Commonly known as: XANAX Take 0.25 mg by mouth daily as needed for anxiety.   aspirin 81 MG EC tablet Take 1 tablet (81 mg total) by mouth daily.   atorvastatin 80 MG tablet Commonly known as: Lipitor Take 1 tablet (80 mg total) by mouth daily. What changed:   medication strength  how much to take   Auryxia 1 GM 210 MG(Fe) tablet Generic drug: ferric citrate Take 420 mg by mouth 3 (three) times daily with meals.   busPIRone 10 MG tablet Commonly known as: BUSPAR Take 1 tablet (10 mg total) by mouth 2 (two) times daily.   carvedilol 6.25 MG tablet Commonly known as: COREG Take 6.25 mg by mouth 2 (two) times daily.   cinacalcet 30 MG tablet Commonly known as: SENSIPAR Take 30 mg by mouth daily.   Dialyvite 800 0.8 MG Wafr Take 1 tablet by mouth daily.   lactulose 10 GM/15ML solution Commonly known as: CHRONULAC Take 20 g by  mouth daily as needed for mild constipation.   losartan 50 MG tablet Commonly known as: COZAAR Take 50 mg by mouth daily.   ondansetron 8 MG tablet Commonly known as: ZOFRAN Take 8 mg by mouth 2 (two) times daily as needed for nausea or vomiting.   zolpidem 10 MG tablet Commonly known as: AMBIEN Take 10 mg by mouth at bedtime as needed.            Durable Medical Equipment  (From admission, onward)         Start     Ordered   02/25/21 1023  DME 3-in-1  Once        02/25/21 1023          Follow-up Information    [name], MD Follow up.   Specialty: Internal Medicine Contact information: [address] [place] [address] [phone]        [facility]. Schedule an appointment as soon as possible for a visit in 6 week(s).   Contact information: [address]  [place] [id number] [phone]             Allergies  Allergen Reactions  . Sulfa Antibiotics Other (See Comments)    Per patient, both parents allergic-so will not take  . Adhesive [Tape] Itching    Consultations:  Neurology  Psychiatry  Nephrology    Procedures/Studies: DG Chest 2 View  Result Date: 02/14/2021 CLINICAL DATA:  Shortness of breath EXAM: CHEST - 2 VIEW COMPARISON:  12/16/2020 FINDINGS: Cardiomegaly. Mild pulmonary hyperinflation. Unchanged mild, diffuse interstitial pulmonary opacity. No new or focal airspace opacity. Disc degenerative disease of the thoracic spine. IMPRESSION: 1. Cardiomegaly. 2. Unchanged mild, diffuse interstitial pulmonary opacity, likely mild edema. No new or focal airspace opacity. Electronically Signed   By: Eddie Candle M.D.   On: 02/14/2021 14:08   MR ANGIO HEAD WO CONTRAST  Result Date: 02/22/2021 CLINICAL DATA:  Slurred speech and abnormal gait EXAM: MRI HEAD WITHOUT CONTRAST MRA HEAD WITHOUT CONTRAST TECHNIQUE: Multiplanar, multiecho pulse sequences of the brain and  surrounding structures were obtained without intravenous contrast. Angiographic images of the head were obtained using MRA technique without contrast. COMPARISON:  None. FINDINGS: MRI HEAD FINDINGS Brain: No acute infarct, mass effect or extra-axial collection. Numerous chronic microhemorrhages supratentorially and infratentorially. Distribution is unchanged. Hyperintense T2-weighted signal is moderately widespread throughout the white matter. Generalized volume loss without a clear lobar predilection. The midline structures are normal. Vascular: Major flow voids are preserved. Skull and upper cervical spine: Normal calvarium and skull base. Visualized upper cervical spine and soft tissues are normal. Sinuses/Orbits:No paranasal sinus fluid levels or advanced mucosal thickening. No mastoid or middle ear effusion. Normal orbits. MRA HEAD FINDINGS POSTERIOR CIRCULATION: --Vertebral arteries: Normal --Inferior cerebellar arteries: Normal. --Basilar artery: Normal. --Superior cerebellar arteries: Normal. --Posterior cerebral arteries: Mild left P1-2 junction stenosis. ANTERIOR CIRCULATION: --Intracranial internal carotid arteries: Normal. --Anterior cerebral arteries (ACA): Normal. --Middle cerebral arteries (MCA): Normal. ANATOMIC VARIANTS: None IMPRESSION: 1. No acute intracranial abnormality. 2. Unchanged appearance of multiple chronic microhemorrhages and chronic small vessel disease. 3. Mild left P1-2 junction stenosis.  Otherwise normal MRA. Electronically Signed   By: Ulyses Jarred M.D.   On: 02/22/2021 00:51   MR Brain Wo Contrast (neuro protocol)  Result Date: 02/22/2021 CLINICAL DATA:  Slurred speech and abnormal gait EXAM: MRI HEAD WITHOUT CONTRAST MRA HEAD WITHOUT CONTRAST TECHNIQUE: Multiplanar, multiecho pulse sequences of the brain and surrounding structures were obtained without intravenous contrast. Angiographic images of the head were obtained using MRA technique without contrast. COMPARISON:   None. FINDINGS: MRI HEAD FINDINGS Brain: No acute infarct, mass effect or extra-axial collection. Numerous chronic microhemorrhages supratentorially and infratentorially. Distribution is unchanged. Hyperintense T2-weighted signal is moderately widespread throughout the white matter. Generalized volume loss without a clear lobar predilection. The midline structures are normal. Vascular: Major flow voids are preserved. Skull and upper cervical spine: Normal calvarium and skull base. Visualized upper cervical spine and soft tissues are normal. Sinuses/Orbits:No paranasal sinus fluid levels or advanced mucosal thickening. No mastoid or middle ear effusion. Normal orbits. MRA HEAD FINDINGS POSTERIOR CIRCULATION: --Vertebral arteries: Normal --Inferior cerebellar arteries: Normal. --Basilar artery: Normal. --Superior cerebellar arteries: Normal. --Posterior cerebral arteries: Mild left P1-2 junction stenosis. ANTERIOR CIRCULATION: --Intracranial internal carotid arteries: Normal. --Anterior cerebral arteries (ACA): Normal. --Middle cerebral arteries (MCA): Normal. ANATOMIC VARIANTS: None IMPRESSION: 1. No acute intracranial abnormality. 2. Unchanged appearance of multiple chronic microhemorrhages and chronic small vessel disease. 3. Mild left P1-2 junction stenosis.  Otherwise normal MRA. Electronically Signed  By: Ulyses Jarred M.D.   On: 02/22/2021 00:51   EEG adult  Result Date: 02/22/2021 Kathryn Havens, MD     02/22/2021  5:23 PM Patient Name: KLEO DUNGEE MRN: [id number] Epilepsy Attending: Lora West Referring Physician/Provider: Dr Lestine Mount Date: 02/22/2021 Duration: 27.38 mins Patient history:  70 y.o. 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 EEG to evaluate for seizure Level of alertness: Awake AEDs during EEG study: None Technical aspects: This EEG study was done with scalp  electrodes positioned according to the 10-20 International system of electrode placement. Electrical activity was acquired at a sampling rate of 500Hz  and reviewed with a high frequency filter of 70Hz  and a low frequency filter of 1Hz . EEG data were recorded continuously and digitally stored. Description: The posterior dominant rhythm consists of 8-9 Hz activity of moderate voltage (25-35 uV) seen predominantly in posterior head regions, symmetric and reactive to eye opening and eye closing. Hyperventilation and photic stimulation were not performed.   IMPRESSION: This study is within normal limits. No seizures or epileptiform discharges were seen throughout the recording. [name]   ECHOCARDIOGRAM COMPLETE  Result Date: 02/22/2021    ECHOCARDIOGRAM REPORT   Patient Name:   [name] Date of Exam: 02/22/2021 Medical Rec #:  [id number]         Height:       62.0 in Accession #:    [id number]        Weight:       115.8 lb Date of Birth:  [birth year]         BSA:          1.516 m Patient Age:    62 years          BP:           117/51 mmHg Patient Gender: F                 HR:           74 bpm. Exam Location:  Inpatient Procedure: 2D Echo Indications:    stroke  History:        Patient has prior history of Echocardiogram examinations, most                 recent 05/14/2020. End stage renal disese and Stroke; Risk                 Factors:Hypertension and Dyslipidemia.  Sonographer:    Johny Chess Referring Phys: [id number] [place]  1. No LV thrombus noted on this non-contrast study. Would recommend limited contrast study given anteroapical/apex hypokinesis. Grade 2 DD is present. Moderately elevated RVSP.  2. Left ventricular ejection fraction, by estimation, is 60 to 65%. The left ventricle has normal function. The left ventricle demonstrates regional wall motion abnormalities (see scoring diagram/findings for description). Left ventricular diastolic parameters are consistent  with Grade II diastolic dysfunction (pseudonormalization). Elevated left atrial pressure.  3. Right ventricular systolic function is normal. The right ventricular size is moderately enlarged. There is moderately elevated pulmonary artery systolic pressure. The estimated right ventricular systolic pressure is 12.2 mmHg.  4. Left atrial size was moderately dilated.  5. The mitral valve is degenerative. Mild mitral valve regurgitation. No evidence of mitral stenosis.  6. Tricuspid valve regurgitation is moderate.  7. The aortic valve is tricuspid. There is mild calcification of the  aortic valve. There is mild thickening of the aortic valve. Aortic valve regurgitation is not visualized. Mild aortic valve sclerosis is present, with no evidence of aortic valve stenosis.  8. The inferior vena cava is normal in size with greater than 50% respiratory variability, suggesting right atrial pressure of 3 mmHg. Comparison(s): No prior Echocardiogram. FINDINGS  Left Ventricle: Left ventricular ejection fraction, by estimation, is 60 to 65%. The left ventricle has normal function. The left ventricle demonstrates regional wall motion abnormalities. The left ventricular internal cavity size was normal in size. There is no left ventricular hypertrophy. Left ventricular diastolic parameters are consistent with Grade II diastolic dysfunction (pseudonormalization). Elevated left atrial pressure.  LV Wall Scoring: The apical anterior segment and apex are hypokinetic. Right Ventricle: The right ventricular size is moderately enlarged. No increase in right ventricular wall thickness. Right ventricular systolic function is normal. There is moderately elevated pulmonary artery systolic pressure. The tricuspid regurgitant  velocity is 3.85 m/s, and with an assumed right atrial pressure of 3 mmHg, the estimated right ventricular systolic pressure is 23.3 mmHg. Left Atrium: Left atrial size was moderately dilated. Right Atrium: Right atrial size  was normal in size. Pericardium: Trivial pericardial effusion is present. Mitral Valve: The mitral valve is degenerative in appearance. Mild mitral annular calcification. Mild mitral valve regurgitation. No evidence of mitral valve stenosis. Tricuspid Valve: The tricuspid valve is grossly normal. Tricuspid valve regurgitation is moderate . No evidence of tricuspid stenosis. Aortic Valve: The aortic valve is tricuspid. There is mild calcification of the aortic valve. There is mild thickening of the aortic valve. Aortic valve regurgitation is not visualized. Mild aortic valve sclerosis is present, with no evidence of aortic valve stenosis. Aortic valve mean gradient measures 5.9 mmHg. Aortic valve peak gradient measures 12.6 mmHg. Aortic valve area, by VTI measures 2.35 cm. Pulmonic Valve: The pulmonic valve was grossly normal. Pulmonic valve regurgitation is not visualized. No evidence of pulmonic stenosis. Aorta: The aortic root and ascending aorta are structurally normal, with no evidence of dilitation. Venous: The inferior vena cava is normal in size with greater than 50% respiratory variability, suggesting right atrial pressure of 3 mmHg. IAS/Shunts: The atrial septum is grossly normal.  LEFT VENTRICLE PLAX 2D LVIDd:         3.90 cm     Diastology LVIDs:         2.50 cm     LV e' medial:    5.98 cm/s LV PW:         0.90 cm     LV E/e' medial:  27.8 LV IVS:        1.00 cm     LV e' lateral:   8.05 cm/s LVOT diam:     1.90 cm     LV E/e' lateral: 20.6 LV SV:         83 LV SV Index:   55 LVOT Area:     2.84 cm  LV Volumes (MOD) LV vol d, MOD A2C: 76.9 ml LV vol s, MOD A2C: 28.1 ml LV SV MOD A2C:     48.8 ml RIGHT VENTRICLE             IVC RV S prime:     11.00 cm/s  IVC diam: 1.70 cm TAPSE (M-mode): 2.2 cm LEFT ATRIUM             Index       RIGHT ATRIUM  Index LA diam:        3.80 cm 2.51 cm/m  RA Area:     17.60 cm LA Vol (A2C):   69.0 ml 45.53 ml/m RA Volume:   45.70 ml  30.15 ml/m LA Vol (A4C):    66.1 ml 43.62 ml/m LA Biplane Vol: 69.9 ml 46.12 ml/m  AORTIC VALVE AV Area (Vmax):    2.43 cm AV Area (Vmean):   2.48 cm AV Area (VTI):     2.35 cm AV Vmax:           177.69 cm/s AV Vmean:          113.109 cm/s AV VTI:            0.355 m AV Peak Grad:      12.6 mmHg AV Mean Grad:      5.9 mmHg LVOT Vmax:         152.00 cm/s LVOT Vmean:        98.800 cm/s LVOT VTI:          0.294 m LVOT/AV VTI ratio: 0.83  AORTA Ao Root diam: 3.00 cm Ao Asc diam:  3.80 cm MITRAL VALVE                 TRICUSPID VALVE MV Area (PHT): 4.21 cm      TR Peak grad:   59.3 mmHg MV Decel Time: 180 msec      TR Vmax:        385.00 cm/s MR Peak grad:    123.7 mmHg MR Mean grad:    96.0 mmHg   SHUNTS MR Vmax:         556.00 cm/s Systemic VTI:  0.29 m MR Vmean:        476.0 cm/s  Systemic Diam: 1.90 cm MR PISA:         1.01 cm MR PISA Eff ROA: 8 mm MR PISA Radius:  0.40 cm MV E velocity: 166.00 cm/s MV A velocity: 95.60 cm/s MV E/A ratio:  1.74 Eleonore Chiquito MD Electronically signed by Eleonore Chiquito MD Signature Date/Time: 02/22/2021/1:16:17 PM    Final    CT HEAD CODE STROKE WO CONTRAST  Result Date: 02/21/2021 CLINICAL DATA:  Code stroke.  Slurred speech EXAM: CT HEAD WITHOUT CONTRAST TECHNIQUE: Contiguous axial images were obtained from the base of the skull through the vertex without intravenous contrast. COMPARISON:  Head CT 05/13/2018 FINDINGS: Brain: There is no mass, hemorrhage or extra-axial collection. There is generalized atrophy without lobar predilection. There is hypoattenuation of the periventricular white matter, most commonly indicating chronic ischemic microangiopathy. Vascular: Atherosclerotic calcification of the vertebral and internal carotid arteries at the skull base. No abnormal hyperdensity of the major intracranial arteries or dural venous sinuses. Skull: The visualized skull base, calvarium and extracranial soft tissues are normal. Upper cervical spinal canal appears narrowed. Sinuses/Orbits: No fluid levels  or advanced mucosal thickening of the visualized paranasal sinuses. No mastoid or middle ear effusion. The orbits are normal. ASPECTS [facility] Stroke Program Early CT Score) - Ganglionic level infarction (caudate, lentiform nuclei, internal capsule, insula, M1-M3 cortex): 7 - Supraganglionic infarction (M4-M6 cortex): 3 Total score (0-10 with 10 being normal): 10 IMPRESSION: 1. No acute intracranial abnormality. 2. ASPECTS is 10. 3. 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 These results were communicated to Dr. Kerney Elbe at 10:12 pm on 02/21/2021 by text page via the [facility] messaging system. Electronically Signed   By: Ulyses Jarred M.D.   On: 02/21/2021 22:12  VAS US CAROTID (at [facility] and [facility] only)  Result Date: 02/22/2021 Carotid Arterial Duplex Study Indications:       Speech disturbance, Weakness and irregular gait. Risk Factors:      Hypertension, hyperlipidemia, past history of smoking,                    coronary artery disease. Other Factors:     ESRD on dialysis. Comparison Study:  Prior normal carotid duplex done 02/22/18 Performing Technologist: Sharion Dove RVS  Examination Guidelines: A complete evaluation includes B-mode imaging, spectral Doppler, color Doppler, and power Doppler as needed of all accessible portions of each vessel. Bilateral testing is considered an integral part of a complete examination. Limited examinations for reoccurring indications may be performed as noted.  Right Carotid Findings: +----------+--------+--------+--------+------------------+---------+           PSV cm/sEDV cm/sStenosisPlaque DescriptionComments  +----------+--------+--------+--------+------------------+---------+ CCA Prox  88      11                                          +----------+--------+--------+--------+------------------+---------+ CCA Distal75      6                                           +----------+--------+--------+--------+------------------+---------+ ICA Prox  55       8               heterogenous      Shadowing +----------+--------+--------+--------+------------------+---------+ ICA Distal70      13                                          +----------+--------+--------+--------+------------------+---------+ ECA       196     9               calcific                    +----------+--------+--------+--------+------------------+---------+ +----------+--------+-------+--------+-------------------+           PSV cm/sEDV cmsDescribeArm Pressure (mmHG) +----------+--------+-------+--------+-------------------+ [id number]                                        +----------+--------+-------+--------+-------------------+ +---------+--------+--+--------+--+ VertebralPSV cm/s95EDV cm/s20 +---------+--------+--+--------+--+  Left Carotid Findings: +----------+--------+--------+--------+------------------+------------------+           PSV cm/sEDV cm/sStenosisPlaque DescriptionComments           +----------+--------+--------+--------+------------------+------------------+ CCA Prox  103     8                                 intimal thickening +----------+--------+--------+--------+------------------+------------------+ CCA Distal69      8                                 intimal thickening +----------+--------+--------+--------+------------------+------------------+ ICA Prox  59      12              heterogenous                         +----------+--------+--------+--------+------------------+------------------+  ICA Distal60      15                                                   +----------+--------+--------+--------+------------------+------------------+ ECA       62      12                                                   +----------+--------+--------+--------+------------------+------------------+ +----------+--------+--------+--------+-------------------+           PSV cm/sEDV cm/sDescribeArm Pressure  (mmHG) +----------+--------+--------+--------+-------------------+ Subclavian117                                         +----------+--------+--------+--------+-------------------+ +---------+--------+--+--------+-+ VertebralPSV cm/s44EDV cm/s5 +---------+--------+--+--------+-+   Summary: Right Carotid: The extracranial vessels were near-normal with only minimal wall                thickening or plaque. Left Carotid: The extracranial vessels were near-normal with only minimal wall               thickening or plaque. Vertebrals:  Bilateral vertebral arteries demonstrate antegrade flow. Subclavians: Normal flow hemodynamics were seen in bilateral subclavian              arteries. *See table(s) above for measurements and observations.  Electronically signed by Antony Contras MD on 02/22/2021 at 12:51:37 PM.    Final    ECHOCARDIOGRAM LIMITED  Result Date: 02/23/2021    ECHOCARDIOGRAM LIMITED REPORT   Patient Name:   [name] Date of Exam: 02/23/2021 Medical Rec #:  [id number]         Height:       62.0 in Accession #:    [id number]        Weight:       119.0 lb Date of Birth:  1951-09-23         BSA:          1.533 m Patient Age:    44 years          BP:           124/55 mmHg Patient Gender: F                 HR:           72 bpm. Exam Location:  Inpatient Procedure: Intracardiac Opacification Agent and Limited Echo Indications:    CVA  History:        Patient has prior history of Echocardiogram examinations, most                 recent 02/22/2021. CHF, CAD, Stroke; Risk Factors:Hypertension                 and Dyslipidemia. ESRD.  Sonographer:    Dustin Flock Referring Phys: [id number] JENNIFER CHOI  Sonographer Comments: echo done 02/22/21 IMPRESSIONS  1. Limited TTE to exclude LV thrombus. Hypokinesis noted of the apical septum, apical anterior segment, and apex. No LV thrombus. Globally EF is preserved ~60%.  2. Left ventricular ejection fraction, by estimation, is 55 to 60%. The left  ventricle has normal  function. The left ventricle demonstrates regional wall motion abnormalities (see scoring diagram/findings for description). FINDINGS  Left Ventricle: Left ventricular ejection fraction, by estimation, is 55 to 60%. The left ventricle has normal function. The left ventricle demonstrates regional wall motion abnormalities. Definity contrast agent was given IV to delineate the left ventricular endocardial borders.  LV Wall Scoring: The apical septal segment, apical anterior segment, and apex are hypokinetic. Eleonore Chiquito MD Electronically signed by Eleonore Chiquito MD Signature Date/Time: 02/23/2021/2:46:19 PM    Final        Discharge Exam: Vitals:   02/25/21 0356 02/25/21 0806  BP: (!) 173/67 (!) 154/60  Pulse: 73 74  Resp: 17 20  Temp: (!) 97.5 F (36.4 C) 97.9 F (36.6 C)  SpO2: 99% 100%    General: Pt is alert, awake, not in acute distress Cardiovascular: RRR, S1/S2 +, no edema Respiratory: CTA bilaterally, no wheezing, no rhonchi, no respiratory distress, no conversational dyspnea  Abdominal: Soft, NT, ND, bowel sounds + Extremities: no edema, no cyanosis Psych: Normal mood and affect, stable judgement and insight     The results of significant diagnostics from this hospitalization (including imaging, microbiology, ancillary and laboratory) are listed below for reference.     Microbiology: Recent Results (from the past 240 hour(s))  Resp Panel by RT-PCR (Flu A&B, Covid) Nasopharyngeal Swab     Status: None   Collection Time: 02/21/21 10:51 PM   Specimen: Nasopharyngeal Swab; Nasopharyngeal(NP) swabs in vial transport medium  Result Value Ref Range Status   SARS Coronavirus 2 by RT PCR NEGATIVE NEGATIVE Final    Comment: (NOTE) SARS-CoV-2 target nucleic acids are NOT DETECTED.  The SARS-CoV-2 RNA is generally detectable in upper respiratory specimens during the acute phase of infection. The lowest concentration of SARS-CoV-2 viral copies this assay can  detect is 138 copies/mL. A negative result does not preclude SARS-Cov-2 infection and should not be used as the sole basis for treatment or other patient management decisions. A negative result may occur with  improper specimen collection/handling, submission of specimen other than nasopharyngeal swab, presence of viral mutation(s) within the areas targeted by this assay, and inadequate number of viral copies(<138 copies/mL). A negative result must be combined with clinical observations, patient history, and epidemiological information. The expected result is Negative.  Fact Sheet for Patients:  EntrepreneurPulse.com.au  Fact Sheet for Healthcare Providers:  IncredibleEmployment.be  This test is no t yet approved or cleared by the [place] FDA and  has been authorized for detection and/or diagnosis of SARS-CoV-2 by FDA under an Emergency Use Authorization (EUA). This EUA will remain  in effect (meaning this test can be used) for the duration of the COVID-19 declaration under Section 564(b)(1) of the Act, 21 U.S.C.section 360bbb-3(b)(1), unless the authorization is terminated  or revoked sooner.       Influenza A by PCR NEGATIVE NEGATIVE Final   Influenza B by PCR NEGATIVE NEGATIVE Final    Comment: (NOTE) The Xpert Xpress SARS-CoV-2/FLU/RSV plus assay is intended as an aid in the diagnosis of influenza from Nasopharyngeal swab specimens and should not be used as a sole basis for treatment. Nasal washings and aspirates are unacceptable for Xpert Xpress SARS-CoV-2/FLU/RSV testing.  Fact Sheet for Patients: EntrepreneurPulse.com.au  Fact Sheet for Healthcare Providers: IncredibleEmployment.be  This test is not yet approved or cleared by the [place] FDA and has been authorized for detection and/or diagnosis of SARS-CoV-2 by FDA under an Emergency Use Authorization (EUA). This EUA will remain in  effect (meaning this test can be used) for the duration of the COVID-19 declaration under Section 564(b)(1) of the Act, 21 U.S.C. section 360bbb-3(b)(1), unless the authorization is terminated or revoked.  Performed at [facility], [place] [address] [place], [place] [address]   SARS CORONAVIRUS 2 (TAT 6-24 HRS) Nasopharyngeal Nasopharyngeal Swab     Status: None   Collection Time: 02/24/21 11:20 AM   Specimen: Nasopharyngeal Swab  Result Value Ref Range Status   SARS Coronavirus 2 NEGATIVE NEGATIVE Final    Comment: (NOTE) SARS-CoV-2 target nucleic acids are NOT DETECTED.  The SARS-CoV-2 RNA is generally detectable in upper and lower respiratory specimens during the acute phase of infection. Negative results do not preclude SARS-CoV-2 infection, do not rule out co-infections with other pathogens, and should not be used as the sole basis for treatment or other patient management decisions. Negative results must be combined with clinical observations, patient history, and epidemiological information. The expected result is Negative.  Fact Sheet for Patients: SugarRoll.be  Fact Sheet for Healthcare Providers: https://www.woods-mathews.com/  This test is not yet approved or cleared by the [place] FDA and  has been authorized for detection and/or diagnosis of SARS-CoV-2 by FDA under an Emergency Use Authorization (EUA). This EUA will remain  in effect (meaning this test can be used) for the duration of the COVID-19 declaration under Se ction 564(b)(1) of the Act, 21 U.S.C. section 360bbb-3(b)(1), unless the authorization is terminated or revoked sooner.  Performed at [facility], [place] [address], [place], [place] [address]      Labs: BNP (last 3 results) Recent Labs    07/21/20 0950  BNP >9,163.8*   Basic Metabolic Panel: Recent Labs  Lab 02/21/21 2154 02/21/21 2157 02/23/21 0406 02/24/21 0229 02/25/21 0136   NA 143 142 135 137 135  K 4.2 4.1 5.8* 5.3* 4.2  CL 98 100 95* 95* 96*  CO2 34*  --  24 26 27   GLUCOSE 100* 100* 75 91 85  BUN 19 25* 51* 68* 31*  CREATININE 3.73* 3.80* 6.52* 8.69* 5.16*  CALCIUM 9.4  --  9.0 8.4* 8.7*   Liver Function Tests: Recent Labs  Lab 02/21/21 2154  AST 22  ALT 10  ALKPHOS 110  BILITOT 0.8  PROT 7.3  ALBUMIN 3.5   No results for input(s): LIPASE, AMYLASE in the last 168 hours. No results for input(s): AMMONIA in the last 168 hours. CBC: Recent Labs  Lab 02/21/21 2154 02/21/21 2157 02/24/21 0850  WBC 5.9  --  5.0  NEUTROABS 3.3  --   --   HGB 11.4* 12.2 10.1*  HCT 36.7 36.0 30.3*  MCV 98.1  --  92.9  PLT 213  --  201   Cardiac Enzymes: No results for input(s): CKTOTAL, CKMB, CKMBINDEX, TROPONINI in the last 168 hours. BNP: Invalid input(s): POCBNP CBG: Recent Labs  Lab 02/21/21 2155  GLUCAP 84   D-Dimer No results for input(s): DDIMER in the last 72 hours. Hgb A1c No results for input(s): HGBA1C in the last 72 hours. Lipid Profile No results for input(s): CHOL, HDL, LDLCALC, TRIG, CHOLHDL, LDLDIRECT in the last 72 hours. Thyroid function studies No results for input(s): TSH, T4TOTAL, T3FREE, THYROIDAB in the last 72 hours.  Invalid input(s): FREET3 Anemia work up No results for input(s): VITAMINB12, FOLATE, FERRITIN, TIBC, IRON, RETICCTPCT in the last 72 hours. Urinalysis    Component Value Date/Time   COLORURINE YELLOW 01/30/2011 1648   APPEARANCEUR CLOUDY (A) 01/30/2011  1648   LABSPEC 1.016 01/30/2011 1648   PHURINE 5.0 01/30/2011 1648   GLUCOSEU NEGATIVE 10/14/2009 0747   HGBUR SMALL (A) 01/30/2011 1648   BILIRUBINUR NEGATIVE 01/30/2011 1648   KETONESUR NEGATIVE 01/30/2011 1648   PROTEINUR 100 (A) 01/30/2011 1648   UROBILINOGEN 0.2 01/30/2011 1648   NITRITE NEGATIVE 01/30/2011 1648   LEUKOCYTESUR SMALL (A) 01/30/2011 1648   Sepsis Labs Invalid input(s): PROCALCITONIN,  WBC,  LACTICIDVEN Microbiology Recent  Results (from the past 240 hour(s))  Resp Panel by RT-PCR (Flu A&B, Covid) Nasopharyngeal Swab     Status: None   Collection Time: 02/21/21 10:51 PM   Specimen: Nasopharyngeal Swab; Nasopharyngeal(NP) swabs in vial transport medium  Result Value Ref Range Status   SARS Coronavirus 2 by RT PCR NEGATIVE NEGATIVE Final    Comment: (NOTE) SARS-CoV-2 target nucleic acids are NOT DETECTED.  The SARS-CoV-2 RNA is generally detectable in upper respiratory specimens during the acute phase of infection. The lowest concentration of SARS-CoV-2 viral copies this assay can detect is 138 copies/mL. A negative result does not preclude SARS-Cov-2 infection and should not be used as the sole basis for treatment or other patient management decisions. A negative result may occur with  improper specimen collection/handling, submission of specimen other than nasopharyngeal swab, presence of viral mutation(s) within the areas targeted by this assay, and inadequate number of viral copies(<138 copies/mL). A negative result must be combined with clinical observations, patient history, and epidemiological information. The expected result is Negative.  Fact Sheet for Patients:  EntrepreneurPulse.com.au  Fact Sheet for Healthcare Providers:  IncredibleEmployment.be  This test is no t yet approved or cleared by the [place] FDA and  has been authorized for detection and/or diagnosis of SARS-CoV-2 by FDA under an Emergency Use Authorization (EUA). This EUA will remain  in effect (meaning this test can be used) for the duration of the COVID-19 declaration under Section 564(b)(1) of the Act, 21 U.S.C.section 360bbb-3(b)(1), unless the authorization is terminated  or revoked sooner.       Influenza A by PCR NEGATIVE NEGATIVE Final   Influenza B by PCR NEGATIVE NEGATIVE Final    Comment: (NOTE) The Xpert Xpress SARS-CoV-2/FLU/RSV plus assay is intended as an aid in the  diagnosis of influenza from Nasopharyngeal swab specimens and should not be used as a sole basis for treatment. Nasal washings and aspirates are unacceptable for Xpert Xpress SARS-CoV-2/FLU/RSV testing.  Fact Sheet for Patients: EntrepreneurPulse.com.au  Fact Sheet for Healthcare Providers: IncredibleEmployment.be  This test is not yet approved or cleared by the [place] FDA and has been authorized for detection and/or diagnosis of SARS-CoV-2 by FDA under an Emergency Use Authorization (EUA). This EUA will remain in effect (meaning this test can be used) for the duration of the COVID-19 declaration under Section 564(b)(1) of the Act, 21 U.S.C. section 360bbb-3(b)(1), unless the authorization is terminated or revoked.  Performed at [facility], [place] [address] [place], [place] [address]   SARS CORONAVIRUS 2 (TAT 6-24 HRS) Nasopharyngeal Nasopharyngeal Swab     Status: None   Collection Time: 02/24/21 11:20 AM   Specimen: Nasopharyngeal Swab  Result Value Ref Range Status   SARS Coronavirus 2 NEGATIVE NEGATIVE Final    Comment: (NOTE) SARS-CoV-2 target nucleic acids are NOT DETECTED.  The SARS-CoV-2 RNA is generally detectable in upper and lower respiratory specimens during the acute phase of infection. Negative results do not preclude SARS-CoV-2 infection, do not rule out co-infections with other pathogens, and should not  be used as the sole basis for treatment or other patient management decisions. Negative results must be combined with clinical observations, patient history, and epidemiological information. The expected result is Negative.  Fact Sheet for Patients: SugarRoll.be  Fact Sheet for Healthcare Providers: https://www.woods-mathews.com/  This test is not yet approved or cleared by the [place] FDA and  has been authorized for detection and/or diagnosis of SARS-CoV-2  by FDA under an Emergency Use Authorization (EUA). This EUA will remain  in effect (meaning this test can be used) for the duration of the COVID-19 declaration under Se ction 564(b)(1) of the Act, 21 U.S.C. section 360bbb-3(b)(1), unless the authorization is terminated or revoked sooner.  Performed at [facility], [place] [address] [place], [place] [address]      Patient was seen and examined on the day of discharge and was found to be in stable condition. Time coordinating discharge: 25 minutes including assessment and coordination of care, as well as examination of the patient.   SIGNED:  Dessa Phi, DO [facility] 02/25/2021, 10:24 AM

## 2021-02-25 NOTE — TOC Transition Note (Signed)
Transition of Care [facility]) - CM/SW Discharge Note   Patient Details  Name: [name] MRN: [id number] Date of Birth: 04-08-1951  [facility] of [facility]) CM/SW Contact:  Marney Setting, [place] Work Phone Number: 02/25/2021, 10:55 AM   Clinical Narrative: MSW Student met with patient who decided she wanted 3in1 today which was then ordered through [facility]. Set up [place] through [facility]. Sister will pick her up. No more TOC needs.       Final next level of care: [place] Barriers to Discharge: No Barriers Identified   Patient Goals and CMS Choice Patient states their goals for this hospitalization and ongoing recovery are:: Pt states her goal is to be able to go back home. CMS Medicare.gov Compare Post Acute Care list provided to:: Patient Choice offered to / list presented to : Patient  Discharge Placement                Patient to be transferred to facility by: Sister Name of family member notified: Self Patient and family notified of of transfer: 02/25/21  Discharge Plan and Services     Post Acute Care Choice: [place]          DME Arranged: 3-N-1 DME Agency: [employer] Date DME Agency Contacted: 02/25/21   Representative spoke with at DME Agency: Freda Munro [place]: PT,OT [place]: [place] Date [place]: 02/25/21   Representative spoke with at [place]: [place] ([place]) Interventions     Readmission Risk Interventions No flowsheet data found.

## 2021-02-25 NOTE — Care Management Important Message (Signed)
Important Message  Patient Details  Name: [name] MRN: [id number] Date of Birth: 1951/08/28   Medicare Important Message Given:  Yes     Conley Pawling [name] 02/25/2021, 3:03 PM

## 2021-02-25 NOTE — Evaluation (Signed)
Speech Language Pathology Evaluation Patient Details Name: [name] MRN: [id number] DOB: 04-23-1951 Today's Date: 02/25/2021 Time: 9678-9381 SLP Time Calculation (min) (ACUTE ONLY): 14 min  Problem List:  Patient Active Problem List   Diagnosis Date Noted  . Major depressive disorder, recurrent episode, moderate ([place]) 02/23/2021  . TIA (transient ischemic attack) 02/21/2021  . Prolonged QT interval 02/21/2021  . NSTEMI (non-ST elevated myocardial infarction) ([place]) 05/14/2020  . Volume overload 05/13/2020  . Hypotension after procedure 03/16/2020  . Chest pain 03/16/2020  . Change in mental state 03/15/2020  . Ventricular tachycardia ([place]) 03/15/2020  . Seizure ([place]) 03/15/2020  . Unresponsive episode 03/15/2020  . Pulmonary edema 01/01/2020  . Complication of vascular access for dialysis 12/21/2019  . Breakdown (mechanical) of surgically created arteriovenous fistula, initial encounter ([place]) 12/18/2019  . Weakness 10/11/2019  . 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 Dependence on renal dialysis ([place]) 07/24/2019  . Iron deficiency anemia, unspecified 06/09/2019  . Age-related osteoporosis without current pathological fracture 04/17/2019  . 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 Coagulation defect, unspecified ([place]) 04/17/2019  . Diarrhea, unspecified 04/17/2019  . Encounter for screening for depression 04/17/2019  . Headache, unspecified 04/17/2019  . 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 Pain, unspecified 04/17/2019  . Polyp of colon 04/17/2019  . Primary generalized (osteo)arthritis 04/17/2019  . Pruritus, unspecified 04/17/2019  . Pure hypercholesterolemia, unspecified 04/17/2019  . Secondary hyperparathyroidism of renal origin ([place]) 04/17/2019  . Gastro-esophageal reflux disease without esophagitis 04/17/2019  . Essential (primary) hypertension 04/17/2019  . Hyperlipidemia, unspecified 04/17/2019  . Transient cerebral  ischemic attack, unspecified 04/17/2019  . Hypoxemia 12/13/2018  . Atrial fibrillation ([place]) 03/23/2017  . Prolonged Q-T interval on ECG 02/24/2017  . Hypokalemia 02/24/2017  . Acute ischemic stroke ([place]) 02/23/2017  . Malnutrition of moderate degree 12/24/2016  . Problem with dialysis access ([place]) 12/21/2016  . Acute ischemic colitis ([place]) 05/16/2016  . Colitis 05/15/2016  . Rectal bleeding 05/15/2016  . Neurologic abnormality 11/19/2015  . Hyperkalemia 11/19/2015  . Anxiety 11/19/2015  . Insomnia 11/19/2015  . Gait instability   . Stroke-like symptom 11/16/2015  . Vestibular neuritis 11/16/2015  . Stroke (cerebrum) ([place]) 11/16/2015  . Dizziness 05/09/2015  . Ataxia 05/09/2015  . H/O: CVA (cerebrovascular accident) 05/09/2015  . Left facial numbness 05/09/2015  . Left leg numbness 05/09/2015  . Hyperlipidemia   . Acute on chronic diastolic heart failure ([place])   . Shortness of breath 04/01/2013  . HTN (hypertension) 04/01/2013  . Cholecystitis, acute 11/07/2012  . End-stage renal disease on hemodialysis ([place]) 11/07/2012  . Anemia in chronic kidney disease 11/07/2012   Past Medical History:  Past Medical History:  Diagnosis Date  . Adenomatous polyp of colon 10/2010, 2006, 2015  . Anemia in CKD (chronic kidney disease) 11/07/2012   s/p blood transfusion.   . Arthritis   . CAD (coronary artery disease)    "something like that"  . CHF (congestive heart failure) ([place])   . Constipation   . Depression with anxiety   . Diverticula, colon   . 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  Had some early rejection then stable function for years, then had slow decline of function and went back on hemodialysis in 2012.  Gets HD TTS schedule at [facility] on [facility] still using L forearm AVF.     [place] GERD (gastroesophageal reflux disease)   .  GI bleed 2017   felt to be ischemic colitis, last colo  2015  . Headache   . Hyperlipidemia   . Hypertension   . Neurologic gait dysfunction   . Neuromuscular disorder (HCC)    neuropathy hand and legs  . Osteoporosis   . Pneumonia   . 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 Pulmonary edema 12/2019  . Stroke [facility]) 11/2015   TIA  . Weight loss, unintentional    Past Surgical History:  Past Surgical History:  Procedure Laterality Date  . AV FISTULA PLACEMENT     for dialysis  . AV FISTULA PLACEMENT Left 11/22/2015   Procedure: ARTERIOVENOUS (AV) FISTULA CREATION-LEFT BRACHIOCEPHALIC;  Surgeon: Serafina Mitchell, MD;  Location: [place];  Service: Vascular;  Laterality: Left;  . AV FISTULA PLACEMENT Right 03/15/2020   Procedure: INSERTION OF ARTERIOVENOUS (AV) GORE-TEX GRAFT ARM ( BRACHIAL AXILLARY );  Surgeon: Katha Cabal, MD;  Location: [facility];  Service: Vascular;  Laterality: Right;  . BACK SURGERY    . CERVICAL FUSION    . CHOLECYSTECTOMY  12/02/2012   Procedure: LAPAROSCOPIC CHOLECYSTECTOMY WITH INTRAOPERATIVE CHOLANGIOGRAM;  Surgeon: Edward Jolly, MD;  Location: [place];  Service: General;  Laterality: N/A;  . EYE SURGERY Bilateral    cataract surgery  . EYE SURGERY Left 2019   laser  . HEMATOMA EVACUATION Left 12/24/2016   Procedure: EVACUATION HEMATOMA LEFT UPPER ARM;  Surgeon: Waynetta Sandy, MD;  Location: [place];  Service: Vascular;  Laterality: Left;  . I & D EXTREMITY Left 12/31/2016   Procedure: IRRIGATION AND DEBRIDEMENT EXTREMITY;  Surgeon: Angelia Mould, MD;  Location: [place];  Service: Vascular;  Laterality: Left;  . INSERTION OF DIALYSIS CATHETER Right 12/24/2016   Procedure: INSERTION OF DIALYSIS CATHETER;  Surgeon: Waynetta Sandy, MD;  Location: [place];  Service: Vascular;  Laterality: Right;  . INSERTION OF DIALYSIS CATHETER Right 02/04/2017   Procedure: INSERTION OF DIALYSIS CATHETER;  Surgeon: Waynetta Sandy, MD;  Location: [place];  Service: Vascular;   Laterality: Right;  . KIDNEY TRANSPLANT  1996  . PERIPHERAL VASCULAR CATHETERIZATION Left 10/23/2016   Procedure: Fistulagram;  Surgeon: Elam Dutch, MD;  Location: [place] [facility];  Service: Cardiovascular;  Laterality: Left;  . PERIPHERAL VASCULAR THROMBECTOMY Right 04/16/2020   Procedure: PERIPHERAL VASCULAR THROMBECTOMY;  Surgeon: Katha Cabal, MD;  Location: [place] [facility];  Service: Cardiovascular;  Laterality: Right;  . RESECTION OF ARTERIOVENOUS FISTULA ANEURYSM Left 11/22/2015   Procedure: RESECTION OF LEFT RADIOCEPHALIC FISTULA ANEURYSM ;  Surgeon: Serafina Mitchell, MD;  Location: [place];  Service: Vascular;  Laterality: Left;  . REVISON OF ARTERIOVENOUS FISTULA Left 12/22/2016   Procedure: REVISON OF LEFT ARTERIOVENOUS FISTULA;  Surgeon: Waynetta Sandy, MD;  Location: [place];  Service: Vascular;  Laterality: Left;  . REVISON OF ARTERIOVENOUS FISTULA Left 02/04/2017   Procedure: REVISON OF LEFT UPPER ARM ARTERIOVENOUS FISTULA;  Surgeon: Waynetta Sandy, MD;  Location: [place];  Service: Vascular;  Laterality: Left;  . UPPER EXTREMITY ANGIOGRAPHY Bilateral 09/19/2019   Procedure: UPPER EXTREMITY ANGIOGRAPHY;  Surgeon: Katha Cabal, MD;  Location: [place] [facility];  Service: Cardiovascular;  Laterality: Bilateral;   HPI:  Pt is a 70 y/o female with PMH of HTN, ESRD on HD, CAD, HLD, presents as code stroke by EMS with confusion, slurred speech and abnormal gait. CT and MRI negative   Assessment / Plan /  Recommendation Clinical Impression  Pt presents with resolved issues with mentation and speech.  She is oriented x4; short-term recall, attention, and problem solving are [facility]. Speech is fluent and without dysarthria.  Expressive and receptive language are WNL. No SLP f/u is necessary. Our service will sign off.    SLP Assessment  SLP Recommendation/Assessment: Patient does not need any further Speech Lanaguage Pathology Services SLP Visit Diagnosis:  Cognitive communication deficit (R41.841)    Follow Up Recommendations  None    Frequency and Duration           SLP Evaluation Cognition  Overall Cognitive Status: No family/caregiver present to determine baseline cognitive functioning Arousal/Alertness: Awake/alert Orientation Level: Oriented X4 Attention: Selective Selective Attention: Appears intact Memory: Appears intact Problem Solving: Appears intact       Comprehension  Auditory Comprehension Overall Auditory Comprehension: Appears within functional limits for tasks assessed Visual Recognition/Discrimination Discrimination: Within Function Limits Reading Comprehension Reading Status: Not tested    Expression Expression Primary Mode of Expression: Verbal Verbal Expression Overall Verbal Expression: Appears within functional limits for tasks assessed Written Expression Dominant Hand: Right   Oral / Motor  Oral Motor/Sensory Function Overall Oral Motor/Sensory Function: Within functional limits Motor Speech Overall Motor Speech: Appears within functional limits for tasks assessed   GO                    Juan Quam Laurice 02/25/2021, 12:42 PM Amanda L. Tivis Ringer, [place] Office number [phone] Pager [phone]

## 2021-02-25 NOTE — Progress Notes (Signed)
Pt has been discharged from the unit via wheelchair. The pt has all belongings sent with them and the AVS has been given and received. Tele and IV has been removed and pt is sent in good spirits

## 2021-02-25 NOTE — Progress Notes (Signed)
[facility] ROUNDING NOTE   Subjective:    Brief history: 70 year old end-stage renal disease Monday Wednesday Friday, hypertension hyperlipidemia coronary disease prior CVA admitted with TIA symptoms.  Usual dialysis Monday Wednesday Friday [facility] kidney center.  Last dialysis was 02/24/2021 with 1 L removed  Blood pressure 173/67 pulse 75 temperature 97.5 O2 sats 99% room air  Sodium 135 potassium 4.2 chloride 96 CO2 27 BUN 31 creatinine 5.16 glucose 85 calcium 8.7 hemoglobin 10.1  Aspirin 81 mg daily, Lipitor 40 mg daily BuSpar 10 mg twice daily cinacalcet 30 mg daily multivitamins 1 daily  Objective:  Vital signs in last 24 hours:  Temp:  [97.5 F (36.4 C)-98.3 F (36.8 C)] 97.5 F (36.4 C) (03/15 0356) Pulse Rate:  [68-73] 73 (03/15 0356) Resp:  [11-18] 17 (03/15 0356) BP: (74-173)/(19-68) 173/67 (03/15 0356) SpO2:  [97 %-100 %] 99 % (03/15 0356) Weight:  [52 kg-53.3 kg] 52 kg (03/14 1206)  Weight change:  Filed Weights   02/22/21 1754 02/24/21 0815 02/24/21 1206  Weight: 54 kg 53.3 kg 52 kg    Intake/Output: I/O last 3 completed shifts: In: 240 [P.O.:240] Out: 1000 [Other:1000]   Intake/Output this shift:  No intake/output data recorded.  General: Frail appearing, nad  Head: NCAT sclera not icteric Neck: Supple. No JVD appreciated  Lungs: Clear bilaterally, no rales, wheeze  Heart: RRR with S1 S2 Abdomen: soft non-tender  Lower extremities:without edema or ischemic changes, no open wounds  Neuro: A & O  X 3. Moves all extremities spontaneously. Psych:  Responds to questions appropriately with a normal affect. Dialysis Access: RUE AVG +bruit   Basic Metabolic Panel: Recent Labs  Lab 02/21/21 2154 02/21/21 2157 02/23/21 0406 02/24/21 0229 02/25/21 0136  NA 143 142 135 137 135  K 4.2 4.1 5.8* 5.3* 4.2  CL 98 100 95* 95* 96*  CO2 34*  --  24 26 27   GLUCOSE 100* 100* 75 91 85  BUN 19 25* 51* 68* 31*  CREATININE 3.73* 3.80* 6.52*  8.69* 5.16*  CALCIUM 9.4  --  9.0 8.4* 8.7*    Liver Function Tests: Recent Labs  Lab 02/21/21 2154  AST 22  ALT 10  ALKPHOS 110  BILITOT 0.8  PROT 7.3  ALBUMIN 3.5   No results for input(s): LIPASE, AMYLASE in the last 168 hours. No results for input(s): AMMONIA in the last 168 hours.  CBC: Recent Labs  Lab 02/21/21 2154 02/21/21 2157 02/24/21 0850  WBC 5.9  --  5.0  NEUTROABS 3.3  --   --   HGB 11.4* 12.2 10.1*  HCT 36.7 36.0 30.3*  MCV 98.1  --  92.9  PLT 213  --  201    Cardiac Enzymes: No results for input(s): CKTOTAL, CKMB, CKMBINDEX, TROPONINI in the last 168 hours.  BNP: Invalid input(s): POCBNP  CBG: Recent Labs  Lab 02/21/21 2155  GLUCAP 84    Microbiology: Results for orders placed or performed during the hospital encounter of 02/21/21  Resp Panel by RT-PCR (Flu A&B, Covid) Nasopharyngeal Swab     Status: None   Collection Time: 02/21/21 10:51 PM   Specimen: Nasopharyngeal Swab; Nasopharyngeal(NP) swabs in vial transport medium  Result Value Ref Range Status   SARS Coronavirus 2 by RT PCR NEGATIVE NEGATIVE Final    Comment: (NOTE) SARS-CoV-2 target nucleic acids are NOT DETECTED.  The SARS-CoV-2 RNA is generally detectable in upper respiratory specimens during the acute phase of infection. The lowest concentration of SARS-CoV-2 viral  copies this assay can detect is 138 copies/mL. A negative result does not preclude SARS-Cov-2 infection and should not be used as the sole basis for treatment or other patient management decisions. A negative result may occur with  improper specimen collection/handling, submission of specimen other than nasopharyngeal swab, presence of viral mutation(s) within the areas targeted by this assay, and inadequate number of viral copies(<138 copies/mL). A negative result must be combined with clinical observations, patient history, and epidemiological information. The expected result is Negative.  Fact Sheet for  Patients:  EntrepreneurPulse.com.au  Fact Sheet for Healthcare Providers:  IncredibleEmployment.be  This test is no t yet approved or cleared by the [place] FDA and  has been authorized for detection and/or diagnosis of SARS-CoV-2 by FDA under an Emergency Use Authorization (EUA). This EUA will remain  in effect (meaning this test can be used) for the duration of the COVID-19 declaration under Section 564(b)(1) of the Act, 21 U.S.C.section 360bbb-3(b)(1), unless the authorization is terminated  or revoked sooner.       Influenza A by PCR NEGATIVE NEGATIVE Final   Influenza B by PCR NEGATIVE NEGATIVE Final    Comment: (NOTE) The Xpert Xpress SARS-CoV-2/FLU/RSV plus assay is intended as an aid in the diagnosis of influenza from Nasopharyngeal swab specimens and should not be used as a sole basis for treatment. Nasal washings and aspirates are unacceptable for Xpert Xpress SARS-CoV-2/FLU/RSV testing.  Fact Sheet for Patients: EntrepreneurPulse.com.au  Fact Sheet for Healthcare Providers: IncredibleEmployment.be  This test is not yet approved or cleared by the [place] FDA and has been authorized for detection and/or diagnosis of SARS-CoV-2 by FDA under an Emergency Use Authorization (EUA). This EUA will remain in effect (meaning this test can be used) for the duration of the COVID-19 declaration under Section 564(b)(1) of the Act, 21 U.S.C. section 360bbb-3(b)(1), unless the authorization is terminated or revoked.  Performed at [facility], [place] [address] [place], [place] [address]   SARS CORONAVIRUS 2 (TAT 6-24 HRS) Nasopharyngeal Nasopharyngeal Swab     Status: None   Collection Time: 02/24/21 11:20 AM   Specimen: Nasopharyngeal Swab  Result Value Ref Range Status   SARS Coronavirus 2 NEGATIVE NEGATIVE Final    Comment: (NOTE) SARS-CoV-2 target nucleic acids are NOT DETECTED.  The  SARS-CoV-2 RNA is generally detectable in upper and lower respiratory specimens during the acute phase of infection. Negative results do not preclude SARS-CoV-2 infection, do not rule out co-infections with other pathogens, and should not be used as the sole basis for treatment or other patient management decisions. Negative results must be combined with clinical observations, patient history, and epidemiological information. The expected result is Negative.  Fact Sheet for Patients: SugarRoll.be  Fact Sheet for Healthcare Providers: https://www.woods-mathews.com/  This test is not yet approved or cleared by the [place] FDA and  has been authorized for detection and/or diagnosis of SARS-CoV-2 by FDA under an Emergency Use Authorization (EUA). This EUA will remain  in effect (meaning this test can be used) for the duration of the COVID-19 declaration under Se ction 564(b)(1) of the Act, 21 U.S.C. section 360bbb-3(b)(1), unless the authorization is terminated or revoked sooner.  Performed at [facility], [place] [address], [place], [place] [address]     Coagulation Studies: No results for input(s): LABPROT, INR in the last 72 hours.  Urinalysis: No results for input(s): COLORURINE, LABSPEC, PHURINE, GLUCOSEU, HGBUR, BILIRUBINUR, KETONESUR, PROTEINUR, UROBILINOGEN, NITRITE, LEUKOCYTESUR in the last 72 hours.  Invalid input(s): APPERANCEUR    Imaging: ECHOCARDIOGRAM LIMITED  Result Date: 02/23/2021    ECHOCARDIOGRAM LIMITED REPORT   Patient Name:   KEYSHAWNA PROUSE Date of Exam: 02/23/2021 Medical Rec #:  [id number]         Height:       62.0 in Accession #:    [id number]        Weight:       119.0 lb Date of Birth:  1951/03/25         BSA:          1.533 m Patient Age:    29 years          BP:           124/55 mmHg Patient Gender: F                 HR:           72 bpm. Exam Location:  Inpatient Procedure: Intracardiac  Opacification Agent and Limited Echo Indications:    CVA  History:        Patient has prior history of Echocardiogram examinations, most                 recent 02/22/2021. CHF, CAD, Stroke; Risk Factors:Hypertension                 and Dyslipidemia. ESRD.  Sonographer:    Dustin Flock Referring Phys: [id number] JENNIFER CHOI  Sonographer Comments: echo done 02/22/21 IMPRESSIONS  1. Limited TTE to exclude LV thrombus. Hypokinesis noted of the apical septum, apical anterior segment, and apex. No LV thrombus. Globally EF is preserved ~60%.  2. Left ventricular ejection fraction, by estimation, is 55 to 60%. The left ventricle has normal function. The left ventricle demonstrates regional wall motion abnormalities (see scoring diagram/findings for description). FINDINGS  Left Ventricle: Left ventricular ejection fraction, by estimation, is 55 to 60%. The left ventricle has normal function. The left ventricle demonstrates regional wall motion abnormalities. Definity contrast agent was given IV to delineate the left ventricular endocardial borders.  LV Wall Scoring: The apical septal segment, apical anterior segment, and apex are hypokinetic. Eleonore Chiquito MD Electronically signed by Eleonore Chiquito MD Signature Date/Time: 02/23/2021/2:46:19 PM    Final      Medications:    .  stroke: mapping our early stages of recovery book   Does not apply Once  . aspirin EC  81 mg Oral Daily  . atorvastatin  80 mg Oral Daily  . busPIRone  10 mg Oral BID  . cinacalcet  30 mg Oral Q breakfast  . multivitamin  1 tablet Oral QHS   acetaminophen **OR** acetaminophen (TYLENOL) oral liquid 160 mg/5 mL **OR** acetaminophen, ALPRAZolam  Assessment/ Plan:   Dialysis Orders:  [facility] MWF 4h 350/500 EDW 49kg 2K/2Ca UFP 2  AVG No heparin  Hectorol 2 TIW   Assessment/Plan: 1.  TIA - Hx prior CVA. No acute findings on MRI. EEG neg. SNF placement recommended.  Per neuro/primary  2.  ESRD -  HD MWF.  Next dialysis treatment  02/26/2021 3.  Hypertension/volume  -BP controlled. No gross volume on exam. UF to EDW as tolerated  4.  Anemia  -hemoglobin stable no ESA needs at this time 5.  Metabolic bone disease -  Continue VDRA, Sensipar,Auryixa binder.  6.  Nutrition - Renal diet with fluid restriction.      LOS: [employer] @TODAY @7 :18 AM

## 2021-02-26 ENCOUNTER — Telehealth: Payer: Self-pay | Admitting: Nurse Practitioner

## 2021-02-26 DIAGNOSIS — N2581 Secondary hyperparathyroidism of renal origin: Secondary | ICD-10-CM | POA: Diagnosis not present

## 2021-02-26 DIAGNOSIS — N186 End stage renal disease: Secondary | ICD-10-CM | POA: Diagnosis not present

## 2021-02-26 DIAGNOSIS — Z992 Dependence on renal dialysis: Secondary | ICD-10-CM | POA: Diagnosis not present

## 2021-02-26 NOTE — Telephone Encounter (Signed)
Transition of care contact from inpatient facility  Date of Discharge: 02/25/2021 Date of Contact: 02/26/2021 Method of contact: Phone  Attempted to contact patient to discuss transition of care from inpatient admission. Patient did not answer the phone. Message was left on the patient's voicemail with call back number [phone].  

## 2021-02-27 ENCOUNTER — Telehealth: Payer: Self-pay | Admitting: Nurse Practitioner

## 2021-02-27 IMAGING — CT CT ABD-PELV W/ CM
2 of 5 series · 16 of 46 positions shown, 18 images · IV contrast (omnipaque)
Comparison: 05/15/2016 CT abdomen/pelvis.

CLINICAL DATA: Epigastric abdominal pain.

EXAM:
CT ABDOMEN AND PELVIS WITH CONTRAST
TECHNIQUE: Multidetector CT imaging of the abdomen and pelvis was performed
using the standard protocol following bolus administration of
intravenous contrast.
CONTRAST:  100mL OMNIPAQUE IOHEXOL 300 MG/ML  SOLN

[Series 3: abd/ pelvis 5.0 i30f 2 (person_name) · axial · 0.80mm/px · z∈[+661,+1006]mm · 13 of 79 slices shown, 15 images]
[im 5/79  soft-tissue]
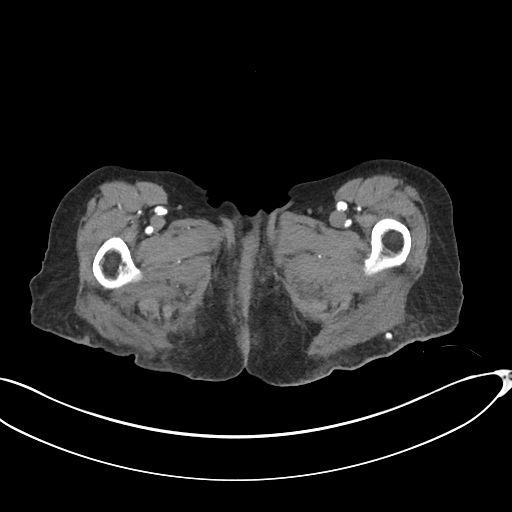
[im 5/79  bone]
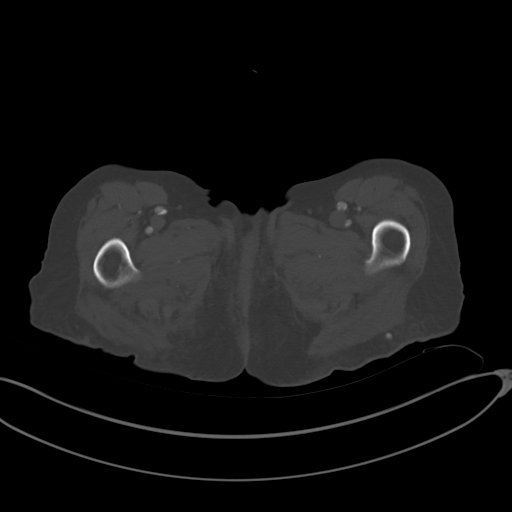
[im 13/79  soft-tissue]
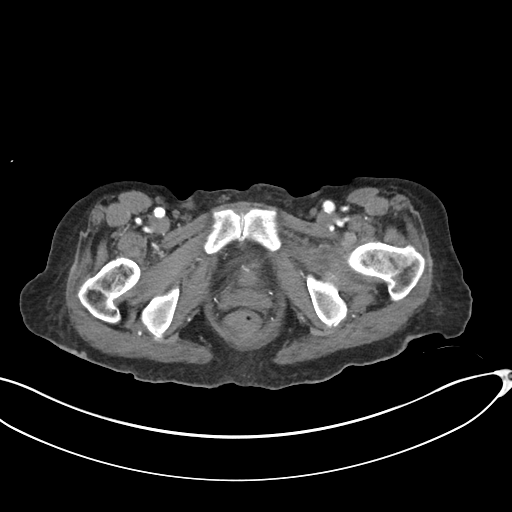
[im 17/79  soft-tissue]
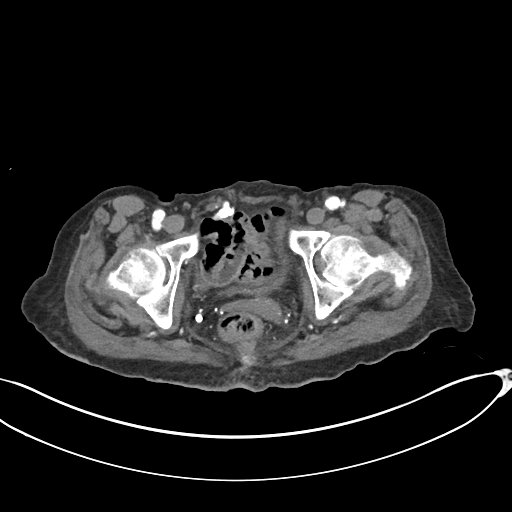
[im 21/79  soft-tissue]
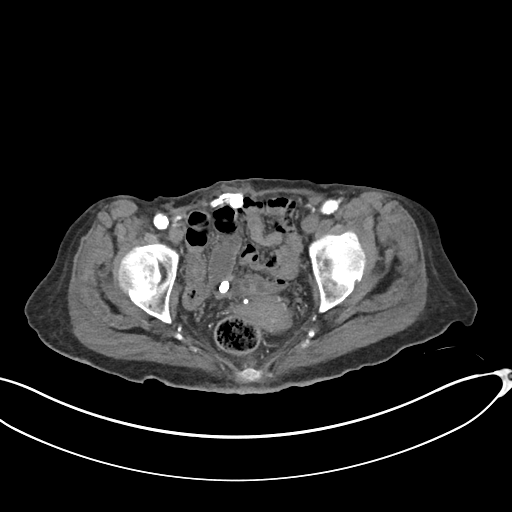
[im 29/79  soft-tissue]
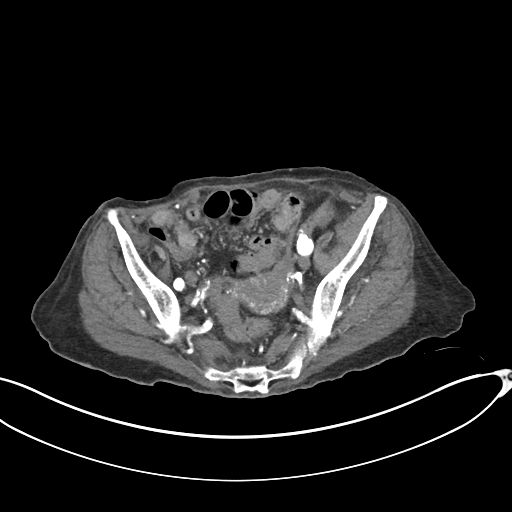
[im 33/79  soft-tissue]
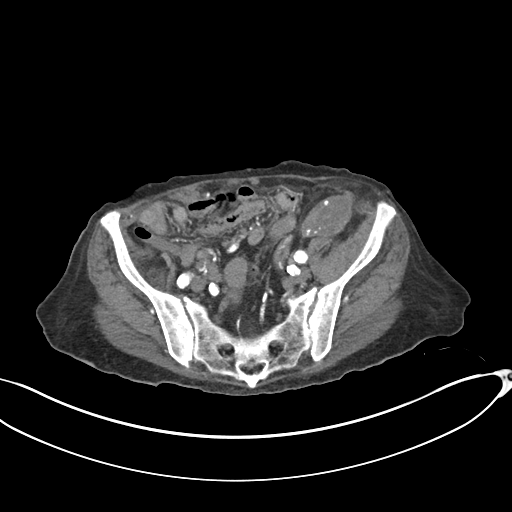
[im 42/79  soft-tissue]
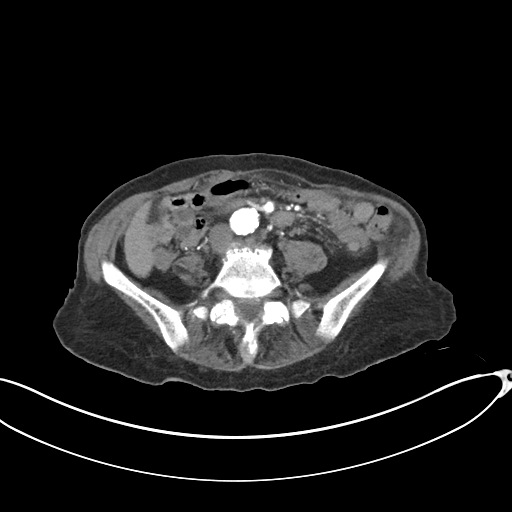
[im 46/79  soft-tissue]
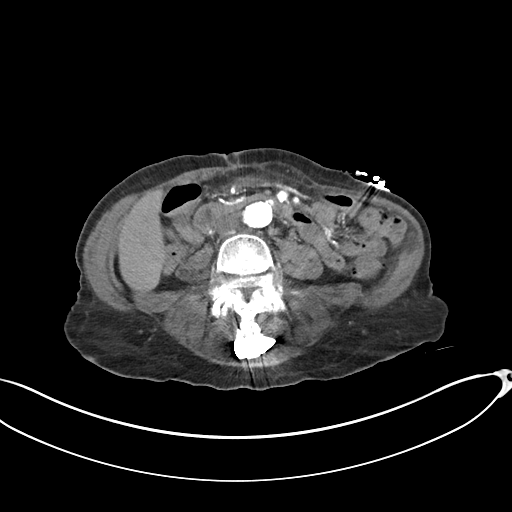
[im 50/79  soft-tissue]
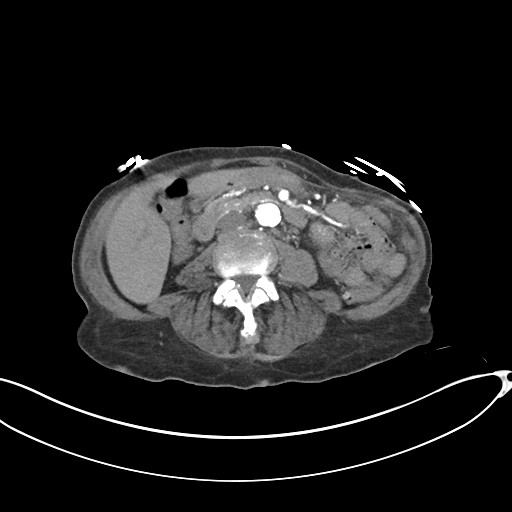
[im 50/79  bone]
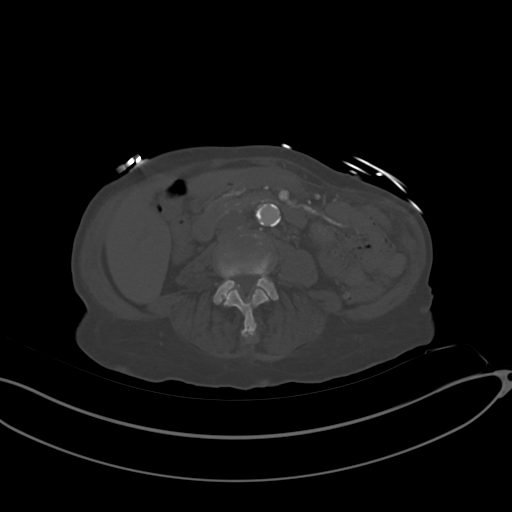
[im 58/79  soft-tissue]
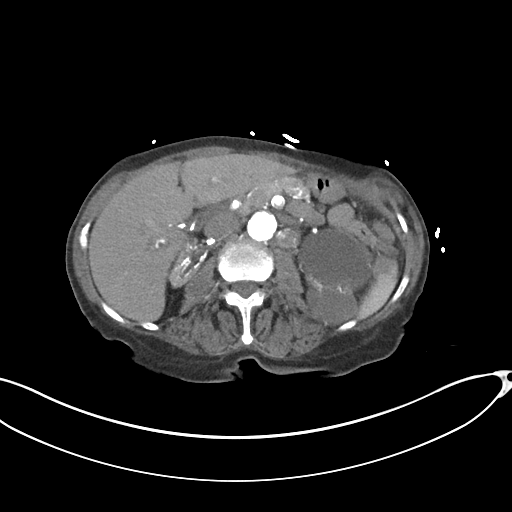
[im 62/79  soft-tissue]
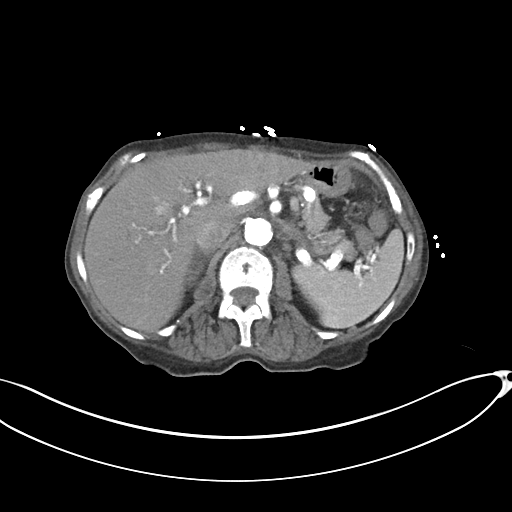
[im 66/79  soft-tissue]
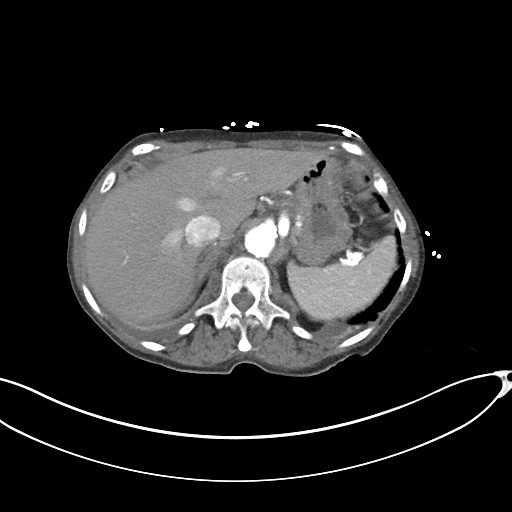
[im 74/79  soft-tissue]
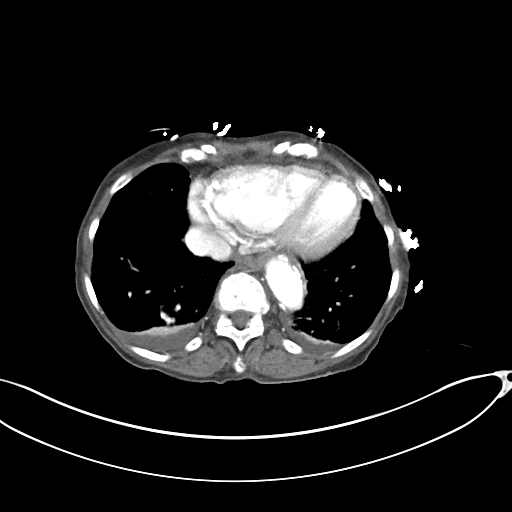

[Series 8: coronal soft tissue · coronal · 0.78mm/px · 3 of 78 slices shown]
[im 26/78  soft-tissue]
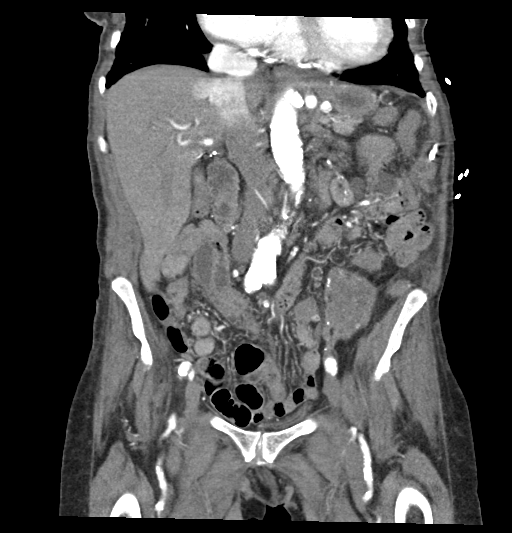
[im 35/78  soft-tissue]
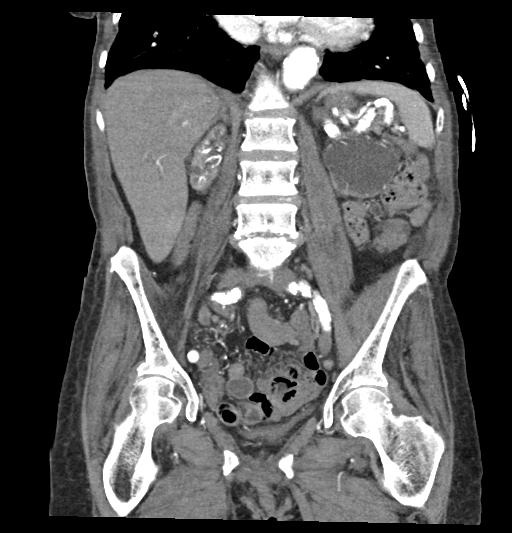
[im 43/78  soft-tissue]
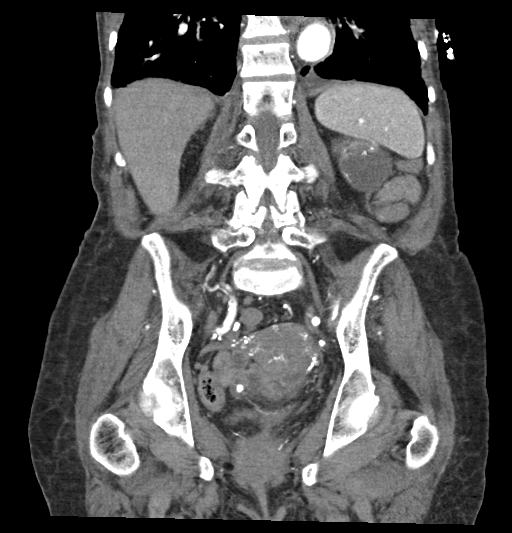

[16 of 46 positions shown; findings below may reference images not displayed]

FINDINGS: Lower chest: Interlobular septal thickening throughout both lung
bases. Cardiomegaly. Small dependent bilateral pleural effusions
with dependent bibasilar atelectasis. Coronary atherosclerosis.

Hepatobiliary: Normal liver size. No liver mass. Cholecystectomy. No
biliary ductal dilatation.

Pancreas: Normal, with no mass or duct dilation.

Spleen: Normal size. No mass.

Adrenals/Urinary Tract: Normal adrenals. Severe bilateral renal
atrophy. No hydronephrosis. Simple bilateral renal cysts, largest
5.0 cm in interpolar left kidney. Normal collapsed bladder.

Stomach/Bowel: Normal non-distended stomach. Normal caliber small
bowel with no small bowel wall thickening. Normal appendix. Mild
left colonic diverticulosis with no large bowel wall thickening or
acute pericolonic fat stranding.

Vascular/Lymphatic: Atherosclerotic nonaneurysmal abdominal aorta.
Contrast reflux into the IVC and hepatic veins. Patent portal,
splenic, hepatic and renal veins. No pathologically enlarged lymph
nodes in the abdomen or pelvis.

Reproductive: Stable mildly enlarged myomatous uterus. No adnexal
masses.

Other: No pneumoperitoneum. No ascites. Soft tissue density 5.0 x
2.7 cm structure in the anterolateral left pelvis (series 3/image
40) along the anterior margin of the left external iliac artery with
scattered internal coarse calcifications, unchanged since 05/14/2016
CT, most compatible with a chronic hematoma.

Musculoskeletal: No aggressive appearing focal osseous lesions.
Severe chronic erosive change at the L4-5 disc, not significantly
changed. Prosthesis between the L4 and L5 spinous processes. Renal
osteodystrophy.
IMPRESSION: 1. Cardiomegaly. Contrast reflux into the IVC and hepatic veins,
suggesting right heart failure. Small dependent bilateral pleural
effusions. Interlobular septal thickening throughout both lung
bases, suggesting mild cardiogenic pulmonary edema.
2. No acute abnormality in the abdomen or pelvis. No evidence of
bowel obstruction or acute bowel inflammation. Mild left colonic
diverticulosis, with no evidence of acute diverticulitis.
3. Stable suspected chronic hematoma in the anterolateral left
pelvis, see comments.
4. Stable mildly enlarged myomatous uterus.
5. Aortic Atherosclerosis ([id number]).

## 2021-02-27 NOTE — Telephone Encounter (Signed)
Transition of care contact from inpatient facility  Date of discharge: 02/25/2021 Date of contact: 02/27/2021 Method: Phone Spoke to: Patient  Patient contacted to discuss transition of care from recent inpatient hospitalization. Patient was admitted to [facility] from 03/11-03/15/2022 with discharge diagnosis of TIA  Medication changes were reviewed.  Patient will follow up with his/her outpatient HD unit on: 02/26/2021

## 2021-02-28 DIAGNOSIS — N186 End stage renal disease: Secondary | ICD-10-CM | POA: Diagnosis not present

## 2021-02-28 DIAGNOSIS — N2581 Secondary hyperparathyroidism of renal origin: Secondary | ICD-10-CM | POA: Diagnosis not present

## 2021-02-28 DIAGNOSIS — Z992 Dependence on renal dialysis: Secondary | ICD-10-CM | POA: Diagnosis not present

## 2021-03-01 DIAGNOSIS — M109 Gout, unspecified: Secondary | ICD-10-CM | POA: Diagnosis not present

## 2021-03-01 DIAGNOSIS — I4891 Unspecified atrial fibrillation: Secondary | ICD-10-CM | POA: Diagnosis not present

## 2021-03-01 DIAGNOSIS — D559 Anemia due to enzyme disorder, unspecified: Secondary | ICD-10-CM | POA: Diagnosis not present

## 2021-03-01 DIAGNOSIS — I214 Non-ST elevation (NSTEMI) myocardial infarction: Secondary | ICD-10-CM | POA: Diagnosis not present

## 2021-03-01 DIAGNOSIS — I132 Hypertensive heart and chronic kidney disease with heart failure and with stage 5 chronic kidney disease, or end stage renal disease: Secondary | ICD-10-CM | POA: Diagnosis not present

## 2021-03-01 DIAGNOSIS — I472 Ventricular tachycardia: Secondary | ICD-10-CM | POA: Diagnosis not present

## 2021-03-01 DIAGNOSIS — N186 End stage renal disease: Secondary | ICD-10-CM | POA: Diagnosis not present

## 2021-03-01 DIAGNOSIS — I5032 Chronic diastolic (congestive) heart failure: Secondary | ICD-10-CM | POA: Diagnosis not present

## 2021-03-01 DIAGNOSIS — I2510 Atherosclerotic heart disease of native coronary artery without angina pectoris: Secondary | ICD-10-CM | POA: Diagnosis not present

## 2021-03-03 DIAGNOSIS — Z992 Dependence on renal dialysis: Secondary | ICD-10-CM | POA: Diagnosis not present

## 2021-03-03 DIAGNOSIS — N186 End stage renal disease: Secondary | ICD-10-CM | POA: Diagnosis not present

## 2021-03-03 DIAGNOSIS — N2581 Secondary hyperparathyroidism of renal origin: Secondary | ICD-10-CM | POA: Diagnosis not present

## 2021-03-04 DIAGNOSIS — N186 End stage renal disease: Secondary | ICD-10-CM | POA: Diagnosis not present

## 2021-03-04 DIAGNOSIS — I4891 Unspecified atrial fibrillation: Secondary | ICD-10-CM | POA: Diagnosis not present

## 2021-03-04 DIAGNOSIS — I472 Ventricular tachycardia: Secondary | ICD-10-CM | POA: Diagnosis not present

## 2021-03-04 DIAGNOSIS — I5032 Chronic diastolic (congestive) heart failure: Secondary | ICD-10-CM | POA: Diagnosis not present

## 2021-03-04 DIAGNOSIS — D559 Anemia due to enzyme disorder, unspecified: Secondary | ICD-10-CM | POA: Diagnosis not present

## 2021-03-04 DIAGNOSIS — M109 Gout, unspecified: Secondary | ICD-10-CM | POA: Diagnosis not present

## 2021-03-04 DIAGNOSIS — I2510 Atherosclerotic heart disease of native coronary artery without angina pectoris: Secondary | ICD-10-CM | POA: Diagnosis not present

## 2021-03-04 DIAGNOSIS — I214 Non-ST elevation (NSTEMI) myocardial infarction: Secondary | ICD-10-CM | POA: Diagnosis not present

## 2021-03-04 DIAGNOSIS — I132 Hypertensive heart and chronic kidney disease with heart failure and with stage 5 chronic kidney disease, or end stage renal disease: Secondary | ICD-10-CM | POA: Diagnosis not present

## 2021-03-05 DIAGNOSIS — N186 End stage renal disease: Secondary | ICD-10-CM | POA: Diagnosis not present

## 2021-03-05 DIAGNOSIS — N2581 Secondary hyperparathyroidism of renal origin: Secondary | ICD-10-CM | POA: Diagnosis not present

## 2021-03-05 DIAGNOSIS — Z992 Dependence on renal dialysis: Secondary | ICD-10-CM | POA: Diagnosis not present

## 2021-03-07 DIAGNOSIS — N2581 Secondary hyperparathyroidism of renal origin: Secondary | ICD-10-CM | POA: Diagnosis not present

## 2021-03-07 DIAGNOSIS — N186 End stage renal disease: Secondary | ICD-10-CM | POA: Diagnosis not present

## 2021-03-07 DIAGNOSIS — Z992 Dependence on renal dialysis: Secondary | ICD-10-CM | POA: Diagnosis not present

## 2021-03-10 DIAGNOSIS — N186 End stage renal disease: Secondary | ICD-10-CM | POA: Diagnosis not present

## 2021-03-10 DIAGNOSIS — N2581 Secondary hyperparathyroidism of renal origin: Secondary | ICD-10-CM | POA: Diagnosis not present

## 2021-03-10 DIAGNOSIS — Z992 Dependence on renal dialysis: Secondary | ICD-10-CM | POA: Diagnosis not present

## 2021-03-11 DIAGNOSIS — I5032 Chronic diastolic (congestive) heart failure: Secondary | ICD-10-CM | POA: Diagnosis not present

## 2021-03-11 DIAGNOSIS — I214 Non-ST elevation (NSTEMI) myocardial infarction: Secondary | ICD-10-CM | POA: Diagnosis not present

## 2021-03-11 DIAGNOSIS — I120 Hypertensive chronic kidney disease with stage 5 chronic kidney disease or end stage renal disease: Secondary | ICD-10-CM | POA: Diagnosis not present

## 2021-03-11 DIAGNOSIS — I472 Ventricular tachycardia: Secondary | ICD-10-CM | POA: Diagnosis not present

## 2021-03-11 DIAGNOSIS — I10 Essential (primary) hypertension: Secondary | ICD-10-CM | POA: Diagnosis not present

## 2021-03-11 DIAGNOSIS — D559 Anemia due to enzyme disorder, unspecified: Secondary | ICD-10-CM | POA: Diagnosis not present

## 2021-03-11 DIAGNOSIS — M109 Gout, unspecified: Secondary | ICD-10-CM | POA: Diagnosis not present

## 2021-03-11 DIAGNOSIS — I2510 Atherosclerotic heart disease of native coronary artery without angina pectoris: Secondary | ICD-10-CM | POA: Diagnosis not present

## 2021-03-11 DIAGNOSIS — N186 End stage renal disease: Secondary | ICD-10-CM | POA: Diagnosis not present

## 2021-03-11 DIAGNOSIS — R1310 Dysphagia, unspecified: Secondary | ICD-10-CM | POA: Diagnosis not present

## 2021-03-11 DIAGNOSIS — F322 Major depressive disorder, single episode, severe without psychotic features: Secondary | ICD-10-CM | POA: Diagnosis not present

## 2021-03-11 DIAGNOSIS — G459 Transient cerebral ischemic attack, unspecified: Secondary | ICD-10-CM | POA: Diagnosis not present

## 2021-03-11 DIAGNOSIS — Z992 Dependence on renal dialysis: Secondary | ICD-10-CM | POA: Diagnosis not present

## 2021-03-11 DIAGNOSIS — I4891 Unspecified atrial fibrillation: Secondary | ICD-10-CM | POA: Diagnosis not present

## 2021-03-11 DIAGNOSIS — R413 Other amnesia: Secondary | ICD-10-CM | POA: Diagnosis not present

## 2021-03-11 DIAGNOSIS — I132 Hypertensive heart and chronic kidney disease with heart failure and with stage 5 chronic kidney disease, or end stage renal disease: Secondary | ICD-10-CM | POA: Diagnosis not present

## 2021-03-12 DIAGNOSIS — N2581 Secondary hyperparathyroidism of renal origin: Secondary | ICD-10-CM | POA: Diagnosis not present

## 2021-03-12 DIAGNOSIS — N186 End stage renal disease: Secondary | ICD-10-CM | POA: Diagnosis not present

## 2021-03-12 DIAGNOSIS — Z992 Dependence on renal dialysis: Secondary | ICD-10-CM | POA: Diagnosis not present

## 2021-03-13 DIAGNOSIS — N186 End stage renal disease: Secondary | ICD-10-CM | POA: Diagnosis not present

## 2021-03-13 DIAGNOSIS — Z992 Dependence on renal dialysis: Secondary | ICD-10-CM | POA: Diagnosis not present

## 2021-03-13 DIAGNOSIS — T8610 Unspecified complication of kidney transplant: Secondary | ICD-10-CM | POA: Diagnosis not present

## 2021-03-14 DIAGNOSIS — N2581 Secondary hyperparathyroidism of renal origin: Secondary | ICD-10-CM | POA: Diagnosis not present

## 2021-03-14 DIAGNOSIS — N186 End stage renal disease: Secondary | ICD-10-CM | POA: Diagnosis not present

## 2021-03-14 DIAGNOSIS — Z992 Dependence on renal dialysis: Secondary | ICD-10-CM | POA: Diagnosis not present

## 2021-03-17 DIAGNOSIS — N186 End stage renal disease: Secondary | ICD-10-CM | POA: Diagnosis not present

## 2021-03-17 DIAGNOSIS — Z992 Dependence on renal dialysis: Secondary | ICD-10-CM | POA: Diagnosis not present

## 2021-03-17 DIAGNOSIS — N2581 Secondary hyperparathyroidism of renal origin: Secondary | ICD-10-CM | POA: Diagnosis not present

## 2021-03-18 DIAGNOSIS — D559 Anemia due to enzyme disorder, unspecified: Secondary | ICD-10-CM | POA: Diagnosis not present

## 2021-03-18 DIAGNOSIS — I132 Hypertensive heart and chronic kidney disease with heart failure and with stage 5 chronic kidney disease, or end stage renal disease: Secondary | ICD-10-CM | POA: Diagnosis not present

## 2021-03-18 DIAGNOSIS — I472 Ventricular tachycardia: Secondary | ICD-10-CM | POA: Diagnosis not present

## 2021-03-18 DIAGNOSIS — M109 Gout, unspecified: Secondary | ICD-10-CM | POA: Diagnosis not present

## 2021-03-18 DIAGNOSIS — I4891 Unspecified atrial fibrillation: Secondary | ICD-10-CM | POA: Diagnosis not present

## 2021-03-18 DIAGNOSIS — I214 Non-ST elevation (NSTEMI) myocardial infarction: Secondary | ICD-10-CM | POA: Diagnosis not present

## 2021-03-18 DIAGNOSIS — N186 End stage renal disease: Secondary | ICD-10-CM | POA: Diagnosis not present

## 2021-03-18 DIAGNOSIS — I5032 Chronic diastolic (congestive) heart failure: Secondary | ICD-10-CM | POA: Diagnosis not present

## 2021-03-18 DIAGNOSIS — I2510 Atherosclerotic heart disease of native coronary artery without angina pectoris: Secondary | ICD-10-CM | POA: Diagnosis not present

## 2021-03-19 DIAGNOSIS — Z992 Dependence on renal dialysis: Secondary | ICD-10-CM | POA: Diagnosis not present

## 2021-03-19 DIAGNOSIS — N2581 Secondary hyperparathyroidism of renal origin: Secondary | ICD-10-CM | POA: Diagnosis not present

## 2021-03-19 DIAGNOSIS — N186 End stage renal disease: Secondary | ICD-10-CM | POA: Diagnosis not present

## 2021-03-20 ENCOUNTER — Ambulatory Visit: Payer: Medicare HMO | Admitting: Emergency Medicine

## 2021-03-21 DIAGNOSIS — Z992 Dependence on renal dialysis: Secondary | ICD-10-CM | POA: Diagnosis not present

## 2021-03-21 DIAGNOSIS — N2581 Secondary hyperparathyroidism of renal origin: Secondary | ICD-10-CM | POA: Diagnosis not present

## 2021-03-21 DIAGNOSIS — N186 End stage renal disease: Secondary | ICD-10-CM | POA: Diagnosis not present

## 2021-03-24 DIAGNOSIS — Z992 Dependence on renal dialysis: Secondary | ICD-10-CM | POA: Diagnosis not present

## 2021-03-24 DIAGNOSIS — N2581 Secondary hyperparathyroidism of renal origin: Secondary | ICD-10-CM | POA: Diagnosis not present

## 2021-03-24 DIAGNOSIS — N186 End stage renal disease: Secondary | ICD-10-CM | POA: Diagnosis not present

## 2021-03-26 DIAGNOSIS — N186 End stage renal disease: Secondary | ICD-10-CM | POA: Diagnosis not present

## 2021-03-26 DIAGNOSIS — N2581 Secondary hyperparathyroidism of renal origin: Secondary | ICD-10-CM | POA: Diagnosis not present

## 2021-03-26 DIAGNOSIS — Z992 Dependence on renal dialysis: Secondary | ICD-10-CM | POA: Diagnosis not present

## 2021-03-28 DIAGNOSIS — Z992 Dependence on renal dialysis: Secondary | ICD-10-CM | POA: Diagnosis not present

## 2021-03-28 DIAGNOSIS — N186 End stage renal disease: Secondary | ICD-10-CM | POA: Diagnosis not present

## 2021-03-28 DIAGNOSIS — N2581 Secondary hyperparathyroidism of renal origin: Secondary | ICD-10-CM | POA: Diagnosis not present

## 2021-03-31 DIAGNOSIS — N186 End stage renal disease: Secondary | ICD-10-CM | POA: Diagnosis not present

## 2021-03-31 DIAGNOSIS — N2581 Secondary hyperparathyroidism of renal origin: Secondary | ICD-10-CM | POA: Diagnosis not present

## 2021-03-31 DIAGNOSIS — Z992 Dependence on renal dialysis: Secondary | ICD-10-CM | POA: Diagnosis not present

## 2021-04-01 ENCOUNTER — Inpatient Hospital Stay: Payer: Self-pay | Admitting: Adult Health

## 2021-04-01 DIAGNOSIS — D559 Anemia due to enzyme disorder, unspecified: Secondary | ICD-10-CM | POA: Diagnosis not present

## 2021-04-01 DIAGNOSIS — I5032 Chronic diastolic (congestive) heart failure: Secondary | ICD-10-CM | POA: Diagnosis not present

## 2021-04-01 DIAGNOSIS — I2510 Atherosclerotic heart disease of native coronary artery without angina pectoris: Secondary | ICD-10-CM | POA: Diagnosis not present

## 2021-04-01 DIAGNOSIS — I4891 Unspecified atrial fibrillation: Secondary | ICD-10-CM | POA: Diagnosis not present

## 2021-04-01 DIAGNOSIS — I132 Hypertensive heart and chronic kidney disease with heart failure and with stage 5 chronic kidney disease, or end stage renal disease: Secondary | ICD-10-CM | POA: Diagnosis not present

## 2021-04-01 DIAGNOSIS — N186 End stage renal disease: Secondary | ICD-10-CM | POA: Diagnosis not present

## 2021-04-01 DIAGNOSIS — M109 Gout, unspecified: Secondary | ICD-10-CM | POA: Diagnosis not present

## 2021-04-01 DIAGNOSIS — I472 Ventricular tachycardia: Secondary | ICD-10-CM | POA: Diagnosis not present

## 2021-04-01 DIAGNOSIS — I214 Non-ST elevation (NSTEMI) myocardial infarction: Secondary | ICD-10-CM | POA: Diagnosis not present

## 2021-04-01 NOTE — Progress Notes (Deleted)
[facility] [address] [place]. [place] [address] [phone]       [place] [name] Date of Birth:  02-02-1951 Medical Record Number:  [id number]   Reason for Referral:  hospital stroke follow up    SUBJECTIVE:   CHIEF COMPLAINT:  No chief complaint on file.   HPI:   [name] is a 70 y.o. female with history of multiple medical conditions including ESRD on HDwho presented on 02/21/2021 via EMS from home as a Code Stroke with acute onset of left facial droop, confusion and slurred speech.  Personally reviewed hospitalization pertinent progress notes, lab work and imaging summary provided.  Evaluated by Dr. Leonie Man - per progress note, reported similar episodes in the past but this one most severe.  MRI negative for acute stroke with possible diagnosis seizure vs TIA.  Imaging showed unchanged appearance of multiple chronic microhemorrhages and chronic small vessel disease.  EEG negative for seizures or epileptiform discharges.  Recommended continuation of aspirin at discharge.  LDL 117 and advised to increase atorvastatin to 80 mg daily.  Other stroke risk factors include advanced age, former tobacco use, prior history of stroke, CAD and CHF.  Other active problems include likely baseline dementia and ESRD.  Evaluated by therapies and recommended [facility] PT/OT as well as 24/7 supervision and discharged home in stable condition on 02/25/2021.  Seizure vs TIA: Unchanged appearance of multiple chronic microhemorrhages and chronic small vessel disease on MRI without evidence of new stroke  Code Stroke CT head No acute intracranial abnormality. ASPECTS 10.     MRA - No acute intracranial abnormality. Unchanged appearance of multiple chronic microhemorrhages and chronic small vessel disease. Mild left P1-2 junction stenosis. Otherwise normal MRA.  Carotid Doppler - Right and Left Carotid: The extracranial vessels were near-normal  with only minimal wall thickening or plaque.  2D Echo-ejection fraction 60 to 65%.  Anterior apical and apex hypokinesis.    LDL 117  HgbA1c 5.0  VTE prophylaxis - SCD's  aspirin 81 mg daily prior to admission, now on aspirin 81 mg daily.   Therapy recommendations:   HH PT/OT  Disposition:  home        ROS:   14 system review of systems performed and negative with exception of ***  PMH:  Past Medical History:  Diagnosis Date  . Adenomatous polyp of colon 10/2010, 2006, 2015  . Anemia in CKD (chronic kidney disease) 11/07/2012   s/p blood transfusion.   . Arthritis   . CAD (coronary artery disease)    "something like that"  . CHF (congestive heart failure) ([place])   . Constipation   . Depression with anxiety   . Diverticula, colon   . 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  Had some early rejection then stable function for years, then had slow decline of function and went back on hemodialysis in 2012.  Gets HD TTS schedule at [facility] on [facility] still using L forearm AVF.     Marland Kitchen GERD (gastroesophageal reflux disease)   . GI bleed 2017   felt to be ischemic colitis, last colo 2015  . Headache   . Hyperlipidemia   . Hypertension   . Neurologic gait dysfunction   . Neuromuscular disorder (HCC)    neuropathy hand and legs  . Osteoporosis   . Pneumonia   . Pseudoaneurysm of surgical  AV fistula (HCC)    left upper arm  . Pulmonary edema 12/2019  . Stroke [facility]) 11/2015   TIA  . Weight loss, unintentional     PSH:  Past Surgical History:  Procedure Laterality Date  . AV FISTULA PLACEMENT     for dialysis  . AV FISTULA PLACEMENT Left 11/22/2015   Procedure: ARTERIOVENOUS (AV) FISTULA CREATION-LEFT BRACHIOCEPHALIC;  Surgeon: Serafina Mitchell, MD;  Location: [place];  Service: Vascular;  Laterality: Left;  . AV FISTULA PLACEMENT Right 03/15/2020   Procedure: INSERTION OF  ARTERIOVENOUS (AV) GORE-TEX GRAFT ARM ( BRACHIAL AXILLARY );  Surgeon: Katha Cabal, MD;  Location: [facility];  Service: Vascular;  Laterality: Right;  . BACK SURGERY    . CERVICAL FUSION    . CHOLECYSTECTOMY  12/02/2012   Procedure: LAPAROSCOPIC CHOLECYSTECTOMY WITH INTRAOPERATIVE CHOLANGIOGRAM;  Surgeon: Edward Jolly, MD;  Location: [place];  Service: General;  Laterality: N/A;  . EYE SURGERY Bilateral    cataract surgery  . EYE SURGERY Left 2019   laser  . HEMATOMA EVACUATION Left 12/24/2016   Procedure: EVACUATION HEMATOMA LEFT UPPER ARM;  Surgeon: Waynetta Sandy, MD;  Location: [place];  Service: Vascular;  Laterality: Left;  . I & D EXTREMITY Left 12/31/2016   Procedure: IRRIGATION AND DEBRIDEMENT EXTREMITY;  Surgeon: Angelia Mould, MD;  Location: [place];  Service: Vascular;  Laterality: Left;  . INSERTION OF DIALYSIS CATHETER Right 12/24/2016   Procedure: INSERTION OF DIALYSIS CATHETER;  Surgeon: Waynetta Sandy, MD;  Location: [place];  Service: Vascular;  Laterality: Right;  . INSERTION OF DIALYSIS CATHETER Right 02/04/2017   Procedure: INSERTION OF DIALYSIS CATHETER;  Surgeon: Waynetta Sandy, MD;  Location: [place];  Service: Vascular;  Laterality: Right;  . KIDNEY TRANSPLANT  1996  . PERIPHERAL VASCULAR CATHETERIZATION Left 10/23/2016   Procedure: Fistulagram;  Surgeon: Elam Dutch, MD;  Location: [place] [facility];  Service: Cardiovascular;  Laterality: Left;  . PERIPHERAL VASCULAR THROMBECTOMY Right 04/16/2020   Procedure: PERIPHERAL VASCULAR THROMBECTOMY;  Surgeon: Katha Cabal, MD;  Location: [facility];  Service: Cardiovascular;  Laterality: Right;  . RESECTION OF ARTERIOVENOUS FISTULA ANEURYSM Left 11/22/2015   Procedure: RESECTION OF LEFT RADIOCEPHALIC FISTULA ANEURYSM ;  Surgeon: Serafina Mitchell, MD;  Location: [place];  Service: Vascular;  Laterality: Left;  . REVISON OF ARTERIOVENOUS FISTULA Left 12/22/2016   Procedure:  REVISON OF LEFT ARTERIOVENOUS FISTULA;  Surgeon: Waynetta Sandy, MD;  Location: [place];  Service: Vascular;  Laterality: Left;  . REVISON OF ARTERIOVENOUS FISTULA Left 02/04/2017   Procedure: REVISON OF LEFT UPPER ARM ARTERIOVENOUS FISTULA;  Surgeon: Waynetta Sandy, MD;  Location: [place];  Service: Vascular;  Laterality: Left;  . UPPER EXTREMITY ANGIOGRAPHY Bilateral 09/19/2019   Procedure: UPPER EXTREMITY ANGIOGRAPHY;  Surgeon: Katha Cabal, MD;  Location: [place] [facility];  Service: Cardiovascular;  Laterality: Bilateral;    Social History:  Social History   Socioeconomic History  . Marital status: Widowed    Spouse name: Not on file  . Number of children: 2  . Years of education: Not on file  . Highest education level: Not on file  Occupational History  . Not on file  Tobacco Use  . Smoking status: Former Smoker    Types: Cigarettes    Quit date: 12/31/1991    Years since quitting: 29.2  . Smokeless tobacco: Never Used  Vaping Use  . Vaping Use: Never used  Substance and Sexual Activity  . Alcohol use: No    Alcohol/week: 0.0 standard drinks  . Drug use: No  . Sexual activity: Not Currently  Other Topics Concern  . Not on file  Social History Narrative   Living with her mother    Right handed   Caffeine: only decaf clears ("no brown sodas" or coffee d/t kidney disease)   Low potassium foods d/t kidney disease   Social Determinants of Health   Financial Resource Strain: Not on file  Food Insecurity: Not on file  Transportation Needs: Not on file  Physical Activity: Not on file  Stress: Not on file  Social Connections: Not on file  Intimate Partner Violence: Not on file    Family History:  Family History  Problem Relation Age of Onset  . Colon cancer Brother   . Cancer Brother   . Coronary artery disease Mother 69  . Hyperlipidemia Mother   . Hypertension Mother   . Stroke Maternal Aunt   . Esophageal cancer Neg Hx   . Stomach  cancer Neg Hx   . Rectal cancer Neg Hx     Medications:   Current Outpatient Medications on File Prior to Visit  Medication Sig Dispense Refill  . acetaminophen (TYLENOL) 500 MG tablet Take 1 tablet (500 mg total) by mouth every 6 (six) hours as needed. 30 tablet 0  . allopurinol (ZYLOPRIM) 300 MG tablet Take 150 mg by mouth daily.     Marland Kitchen ALPRAZolam (XANAX) 0.25 MG tablet Take 0.25 mg by mouth daily as needed for anxiety.     Marland Kitchen aspirin EC 81 MG EC tablet Take 1 tablet (81 mg total) by mouth daily.    Marland Kitchen atorvastatin (LIPITOR) 80 MG tablet Take 1 tablet (80 mg total) by mouth daily. 30 tablet 0  . atorvastatin (LIPITOR) 80 MG tablet TAKE 1 TABLET (80 MG TOTAL) BY MOUTH DAILY. 30 tablet 0  . AURYXIA 1 GM 210 MG(Fe) tablet Take 420 mg by mouth 3 (three) times daily with meals.     . B Complex-C-Folic Acid (DIALYVITE 675) 0.8 MG WAFR Take 1 tablet by mouth daily.   11  . busPIRone (BUSPAR) 10 MG tablet TAKE 1 TABLET (10 MG TOTAL) BY MOUTH TWO TIMES DAILY. 60 tablet 0  . carvedilol (COREG) 6.25 MG tablet Take 6.25 mg by mouth 2 (two) times daily.    . cinacalcet (SENSIPAR) 30 MG tablet Take 30 mg by mouth daily.    Marland Kitchen lactulose (CHRONULAC) 10 GM/15ML solution Take 20 g by mouth daily as needed for mild constipation.    Marland Kitchen losartan (COZAAR) 50 MG tablet Take 50 mg by mouth daily.    . ondansetron (ZOFRAN) 8 MG tablet Take 8 mg by mouth 2 (two) times daily as needed for nausea or vomiting.   0  . zolpidem (AMBIEN) 10 MG tablet Take 10 mg by mouth at bedtime as needed.     No current facility-administered medications on file prior to visit.    Allergies:   Allergies  Allergen Reactions  . Sulfa Antibiotics Other (See Comments)    Per patient, both parents allergic-so will not take  . Adhesive [Tape] Itching      OBJECTIVE:  Physical Exam  There were no vitals filed for this visit. There is no height or weight on file to calculate BMI. No exam data present  No flowsheet data found.    General: well developed, well nourished, seated, in no evident distress Head: head normocephalic  and atraumatic.   Neck: supple with no carotid or supraclavicular bruits Cardiovascular: regular rate and rhythm, no murmurs Musculoskeletal: no deformity Skin:  no rash/petichiae Vascular:  Normal pulses all extremities   Neurologic Exam Mental Status: Awake and fully alert. Oriented to place and time. Recent and remote memory intact. Attention span, concentration and fund of knowledge appropriate. Mood and affect appropriate.  Cranial Nerves: Fundoscopic exam reveals sharp disc margins. Pupils equal, briskly reactive to light. Extraocular movements full without nystagmus. Visual fields full to confrontation. Hearing intact. Facial sensation intact. Face, tongue, palate moves normally and symmetrically.  Motor: Normal bulk and tone. Normal strength in all tested extremity muscles Sensory.: intact to touch , pinprick , position and vibratory sensation.  Coordination: Rapid alternating movements normal in all extremities. Finger-to-nose and heel-to-shin performed accurately bilaterally. Gait and Station: Arises from chair without difficulty. Stance is normal. Gait demonstrates normal stride length and balance with ***. Tandem walk and heel toe ***.  Reflexes: 1+ and symmetric. Toes downgoing.     NIHSS  *** Modified Rankin  ***      ASSESSMENT: [name] is a 70 y.o. year old female presented with left facial droop, confusion and slurred speech on 02/21/2021 possibly in setting of TIA vs seizure with prior similar episodes and MRI negative for acute infarct. Vascular risk factors include hx of prior stroke, chronic cerebral microhemorrhages, HTN, HLD, advanced age, former tobacco use, CAD and CHF.      PLAN:  1. TIA : Residual deficit: ***. Continue aspirin 81 mg daily  and atorvastatin for secondary stroke prevention.  Discussed secondary stroke prevention measures and  importance of close PCP follow up for aggressive stroke risk factor management  2. HTN: BP goal <130/90.  Stable on *** per PCP 3. HLD: LDL goal <70. Recent LDL 117 - increased atorvastatin to 80 mg daily during recent stroke admission.  4.     Follow up in *** or call earlier if needed   CC:  [facility] provider: Dr. Leonie Man PCP: [name], MD    I spent *** minutes of face-to-face and non-face-to-face time with patient.  This included previsit chart review, lab review, study review, order entry, electronic health record documentation, patient education regarding recent stroke, residual deficits, importance of managing stroke risk factors and answered all questions to patient satisfaction     Frann Rider, [facility]  [facility] [address] [place], [place] [address]  Phone [phone] Fax [phone] Note: This document was prepared with digital dictation and possible smart phrase technology. Any transcriptional errors that result from this process are unintentional.

## 2021-04-02 DIAGNOSIS — Z992 Dependence on renal dialysis: Secondary | ICD-10-CM | POA: Diagnosis not present

## 2021-04-02 DIAGNOSIS — N186 End stage renal disease: Secondary | ICD-10-CM | POA: Diagnosis not present

## 2021-04-02 DIAGNOSIS — N2581 Secondary hyperparathyroidism of renal origin: Secondary | ICD-10-CM | POA: Diagnosis not present

## 2021-04-04 DIAGNOSIS — Z992 Dependence on renal dialysis: Secondary | ICD-10-CM | POA: Diagnosis not present

## 2021-04-04 DIAGNOSIS — N186 End stage renal disease: Secondary | ICD-10-CM | POA: Diagnosis not present

## 2021-04-04 DIAGNOSIS — N2581 Secondary hyperparathyroidism of renal origin: Secondary | ICD-10-CM | POA: Diagnosis not present

## 2021-04-06 ENCOUNTER — Other Ambulatory Visit: Payer: Self-pay

## 2021-04-06 ENCOUNTER — Emergency Department (HOSPITAL_COMMUNITY)
Admission: EM | Admit: 2021-04-06 | Discharge: 2021-04-07 | Disposition: A | Discharge status: Home / Self Care | Payer: Medicare HMO | Source: Home / Self Care | Attending: Emergency Medicine | Admitting: Emergency Medicine

## 2021-04-06 ENCOUNTER — Emergency Department (HOSPITAL_COMMUNITY): Payer: Medicare HMO

## 2021-04-06 ENCOUNTER — Emergency Department (HOSPITAL_COMMUNITY)
Admission: EM | Admit: 2021-04-06 | Discharge: 2021-04-06 | Disposition: A | Discharge status: Home / Self Care | Payer: Medicare HMO | Attending: Emergency Medicine | Admitting: Emergency Medicine

## 2021-04-06 ENCOUNTER — Encounter (HOSPITAL_COMMUNITY): Payer: Self-pay | Admitting: Emergency Medicine

## 2021-04-06 DIAGNOSIS — D631 Anemia in chronic kidney disease: Secondary | ICD-10-CM | POA: Diagnosis not present

## 2021-04-06 DIAGNOSIS — J811 Chronic pulmonary edema: Secondary | ICD-10-CM | POA: Diagnosis not present

## 2021-04-06 DIAGNOSIS — R0602 Shortness of breath: Secondary | ICD-10-CM

## 2021-04-06 DIAGNOSIS — I132 Hypertensive heart and chronic kidney disease with heart failure and with stage 5 chronic kidney disease, or end stage renal disease: Secondary | ICD-10-CM | POA: Insufficient documentation

## 2021-04-06 DIAGNOSIS — R079 Chest pain, unspecified: Secondary | ICD-10-CM | POA: Diagnosis not present

## 2021-04-06 DIAGNOSIS — Z79899 Other long term (current) drug therapy: Secondary | ICD-10-CM | POA: Insufficient documentation

## 2021-04-06 DIAGNOSIS — Z992 Dependence on renal dialysis: Secondary | ICD-10-CM | POA: Insufficient documentation

## 2021-04-06 DIAGNOSIS — R0902 Hypoxemia: Secondary | ICD-10-CM | POA: Diagnosis not present

## 2021-04-06 DIAGNOSIS — J810 Acute pulmonary edema: Secondary | ICD-10-CM | POA: Insufficient documentation

## 2021-04-06 DIAGNOSIS — N186 End stage renal disease: Secondary | ICD-10-CM | POA: Insufficient documentation

## 2021-04-06 DIAGNOSIS — R0789 Other chest pain: Secondary | ICD-10-CM | POA: Diagnosis not present

## 2021-04-06 DIAGNOSIS — I2510 Atherosclerotic heart disease of native coronary artery without angina pectoris: Secondary | ICD-10-CM | POA: Insufficient documentation

## 2021-04-06 DIAGNOSIS — I5033 Acute on chronic diastolic (congestive) heart failure: Secondary | ICD-10-CM | POA: Insufficient documentation

## 2021-04-06 DIAGNOSIS — Z87891 Personal history of nicotine dependence: Secondary | ICD-10-CM | POA: Insufficient documentation

## 2021-04-06 DIAGNOSIS — I499 Cardiac arrhythmia, unspecified: Secondary | ICD-10-CM | POA: Diagnosis not present

## 2021-04-06 DIAGNOSIS — Z940 Kidney transplant status: Secondary | ICD-10-CM | POA: Insufficient documentation

## 2021-04-06 DIAGNOSIS — I1311 Hypertensive heart and chronic kidney disease without heart failure, with stage 5 chronic kidney disease, or end stage renal disease: Secondary | ICD-10-CM | POA: Insufficient documentation

## 2021-04-06 DIAGNOSIS — Z20822 Contact with and (suspected) exposure to covid-19: Secondary | ICD-10-CM | POA: Insufficient documentation

## 2021-04-06 DIAGNOSIS — Z7982 Long term (current) use of aspirin: Secondary | ICD-10-CM | POA: Insufficient documentation

## 2021-04-06 DIAGNOSIS — R000 Tachycardia, unspecified: Secondary | ICD-10-CM | POA: Diagnosis not present

## 2021-04-06 DIAGNOSIS — M2550 Pain in unspecified joint: Secondary | ICD-10-CM | POA: Diagnosis not present

## 2021-04-06 DIAGNOSIS — Z7401 Bed confinement status: Secondary | ICD-10-CM | POA: Diagnosis not present

## 2021-04-06 DIAGNOSIS — R0600 Dyspnea, unspecified: Secondary | ICD-10-CM | POA: Diagnosis not present

## 2021-04-06 DIAGNOSIS — I10 Essential (primary) hypertension: Secondary | ICD-10-CM | POA: Diagnosis not present

## 2021-04-06 DIAGNOSIS — I517 Cardiomegaly: Secondary | ICD-10-CM | POA: Diagnosis not present

## 2021-04-06 LAB — CBC WITH DIFFERENTIAL/PLATELET
Abs Immature Granulocytes: 0.02 10*3/uL (ref 0.00–0.07)
Abs Immature Granulocytes: 0.02 10*3/uL (ref 0.00–0.07)
Basophils Absolute: 0 10*3/uL (ref 0.0–0.1)
Basophils Absolute: 0 10*3/uL (ref 0.0–0.1)
Basophils Relative: 1 %
Basophils Relative: 0 %
Eosinophils Absolute: 0.2 10*3/uL (ref 0.0–0.5)
Eosinophils Absolute: 0.1 10*3/uL (ref 0.0–0.5)
Eosinophils Relative: 4 %
Eosinophils Relative: 2 %
HCT: 34.5 % — ABNORMAL LOW (ref 36.0–46.0)
HCT: 32.7 % — ABNORMAL LOW (ref 36.0–46.0)
Hemoglobin: 10.9 g/dL — ABNORMAL LOW (ref 12.0–15.0)
Hemoglobin: 10.6 g/dL — ABNORMAL LOW (ref 12.0–15.0)
Immature Granulocytes: 0 %
Immature Granulocytes: 0 %
Lymphocytes Relative: 30 %
Lymphocytes Relative: 12 %
Lymphs Abs: 1.8 10*3/uL (ref 0.7–4.0)
Lymphs Abs: 1 10*3/uL (ref 0.7–4.0)
MCH: 29.9 pg (ref 26.0–34.0)
MCH: 30.3 pg (ref 26.0–34.0)
MCHC: 31.6 g/dL (ref 30.0–36.0)
MCHC: 32.4 g/dL (ref 30.0–36.0)
MCV: 94.5 fL (ref 80.0–100.0)
MCV: 93.4 fL (ref 80.0–100.0)
Monocytes Absolute: 0.7 10*3/uL (ref 0.1–1.0)
Monocytes Absolute: 0.8 10*3/uL (ref 0.1–1.0)
Monocytes Relative: 11 %
Monocytes Relative: 10 %
Neutro Abs: 3.3 10*3/uL (ref 1.7–7.7)
Neutro Abs: 6.1 10*3/uL (ref 1.7–7.7)
Neutrophils Relative %: 54 %
Neutrophils Relative %: 76 %
Platelets: 246 10*3/uL (ref 150–400)
Platelets: 249 10*3/uL (ref 150–400)
RBC: 3.65 MIL/uL — ABNORMAL LOW (ref 3.87–5.11)
RBC: 3.5 MIL/uL — ABNORMAL LOW (ref 3.87–5.11)
RDW: 16.4 % — ABNORMAL HIGH (ref 11.5–15.5)
RDW: 16.4 % — ABNORMAL HIGH (ref 11.5–15.5)
WBC: 6.1 10*3/uL (ref 4.0–10.5)
WBC: 8 10*3/uL (ref 4.0–10.5)
nRBC: 0 % (ref 0.0–0.2)
nRBC: 0 % (ref 0.0–0.2)

## 2021-04-06 LAB — BASIC METABOLIC PANEL
Anion gap: 16 — ABNORMAL HIGH (ref 5–15)
BUN: 45 mg/dL — ABNORMAL HIGH (ref 8–23)
CO2: 29 mmol/L (ref 22–32)
Calcium: 8.9 mg/dL (ref 8.9–10.3)
Chloride: 94 mmol/L — ABNORMAL LOW (ref 98–111)
Creatinine, Ser: 7.13 mg/dL — ABNORMAL HIGH (ref 0.44–1.00)
GFR, Estimated: 6 mL/min — ABNORMAL LOW (ref >60)
Glucose, Bld: 94 mg/dL (ref 70–99)
Potassium: 5.1 mmol/L (ref 3.5–5.1)
Sodium: 139 mmol/L (ref 135–145)

## 2021-04-06 LAB — COMPREHENSIVE METABOLIC PANEL
ALT: 8 U/L (ref 0–44)
AST: 21 U/L (ref 15–41)
Albumin: 3.2 g/dL — ABNORMAL LOW (ref 3.5–5.0)
Alkaline Phosphatase: 118 U/L (ref 38–126)
Anion gap: 17 — ABNORMAL HIGH (ref 5–15)
BUN: 40 mg/dL — ABNORMAL HIGH (ref 8–23)
CO2: 31 mmol/L (ref 22–32)
Calcium: 9.1 mg/dL (ref 8.9–10.3)
Chloride: 92 mmol/L — ABNORMAL LOW (ref 98–111)
Creatinine, Ser: 6.45 mg/dL — ABNORMAL HIGH (ref 0.44–1.00)
GFR, Estimated: 6 mL/min — ABNORMAL LOW (ref >60)
Glucose, Bld: 87 mg/dL (ref 70–99)
Potassium: 4.4 mmol/L (ref 3.5–5.1)
Sodium: 140 mmol/L (ref 135–145)
Total Bilirubin: 0.4 mg/dL (ref 0.3–1.2)
Total Protein: 7.4 g/dL (ref 6.5–8.1)

## 2021-04-06 LAB — TROPONIN I (HIGH SENSITIVITY)
Troponin I (High Sensitivity): 77 ng/L — ABNORMAL HIGH (ref <18)
Troponin I (High Sensitivity): 66 ng/L — ABNORMAL HIGH (ref <18)
Troponin I (High Sensitivity): 84 ng/L — ABNORMAL HIGH (ref <18)

## 2021-04-06 LAB — BRAIN NATRIURETIC PEPTIDE: B Natriuretic Peptide: 3215.6 pg/mL — ABNORMAL HIGH (ref 0.0–100.0)

## 2021-04-06 LAB — RESP PANEL BY RT-PCR (FLU A&B, COVID) ARPGX2
Influenza A by PCR: NEGATIVE
Influenza B by PCR: NEGATIVE
SARS Coronavirus 2 by RT PCR: NEGATIVE

## 2021-04-06 MED ORDER — NITROGLYCERIN 2 % TD OINT
1.0000 [in_us] | TOPICAL_OINTMENT | Freq: Once | TRANSDERMAL | Status: AC
  Administered 2021-04-06: 1 [in_us] via TOPICAL
  Filled 2021-04-06: qty 1

## 2021-04-06 MED ORDER — CHLORHEXIDINE GLUCONATE CLOTH 2 % EX PADS: 6.0000 | MEDICATED_PAD | Freq: Every day | CUTANEOUS | Status: DC

## 2021-04-06 NOTE — ED Notes (Signed)
Pt had a bm and vomiting episode on herself. Pt cleaned and changed at this time with sheets changed. Pt also pulled up in bed at this time. Denies any pain only c/o SOB

## 2021-04-06 NOTE — ED Notes (Signed)
Pt placed on bed pan for bowel movement

## 2021-04-06 NOTE — ED Notes (Signed)
Tried to get patient blood, but I didn't have any success. The Nurse was informed.

## 2021-04-06 NOTE — ED Provider Notes (Signed)
[facility] EMERGENCY DEPARTMENT Provider Note   CSN: [id number] Arrival date & time: 04/06/21  1141     History Chief Complaint  Patient presents with  . Shortness of Breath    [name] is a 70 y.o. female.  HPI 70 year old female presents with shortness of breath.  She was seen here last night and discharged this morning.  She states the shortness of breath has been going on for about 2 days.  Started after her Friday dialysis session.  She has not missed dialysis recently.  Feels like when she has had too much fluid.  No leg swelling.  Some chest pain yesterday but none today.  Has had some cough.  No fevers.   Past Medical History:  Diagnosis Date  . Adenomatous polyp of colon 10/2010, 2006, 2015  . Anemia in CKD (chronic kidney disease) 11/07/2012   s/p blood transfusion.   . Arthritis   . CAD (coronary artery disease)    "something like that"  . CHF (congestive heart failure) ([place])   . Constipation   . Depression with anxiety   . Diverticula, colon   . 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  Had some early rejection then stable function for years, then had slow decline of function and went back on hemodialysis in 2012.  Gets HD TTS schedule at [facility] on [facility] still using L forearm AVF.     Marland Kitchen GERD (gastroesophageal reflux disease)   . GI bleed 2017   felt to be ischemic colitis, last colo 2015  . Headache   . Hyperlipidemia   . Hypertension   . Neurologic gait dysfunction   . Neuromuscular disorder (HCC)    neuropathy hand and legs  . Osteoporosis   . Pneumonia   . 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 Pulmonary edema 12/2019  . Stroke [facility]) 11/2015   TIA  . Weight loss, unintentional     Patient Active Problem List   Diagnosis Date Noted  . Major depressive disorder, recurrent episode, moderate ([place])  02/23/2021  . TIA (transient ischemic attack) 02/21/2021  . Prolonged QT interval 02/21/2021  . NSTEMI (non-ST elevated myocardial infarction) ([place]) 05/14/2020  . Volume overload 05/13/2020  . Hypotension after procedure 03/16/2020  . Chest pain 03/16/2020  . Change in mental state 03/15/2020  . Ventricular tachycardia ([place]) 03/15/2020  . Seizure ([place]) 03/15/2020  . Unresponsive episode 03/15/2020  . Pulmonary edema 01/01/2020  . Complication of vascular access for dialysis 12/21/2019  . Breakdown (mechanical) of surgically created arteriovenous fistula, initial encounter ([place]) 12/18/2019  . Weakness 10/11/2019  . 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 Dependence on renal dialysis ([place]) 07/24/2019  . Iron deficiency anemia, unspecified 06/09/2019  . Age-related osteoporosis without current pathological fracture 04/17/2019  . 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 Coagulation defect, unspecified ([employer]) 04/17/2019  . Diarrhea, unspecified 04/17/2019  . Encounter for screening for depression 04/17/2019  . Headache, unspecified 04/17/2019  . 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 Pain, unspecified 04/17/2019  . Polyp of colon 04/17/2019  . Primary generalized (osteo)arthritis 04/17/2019  . Pruritus, unspecified 04/17/2019  . Pure hypercholesterolemia, unspecified 04/17/2019  . Secondary hyperparathyroidism of renal origin ([place]) 04/17/2019  . Gastro-esophageal reflux disease without esophagitis 04/17/2019  . Essential (primary) hypertension 04/17/2019  . Hyperlipidemia, unspecified 04/17/2019  . Transient  cerebral ischemic attack, unspecified 04/17/2019  . Hypoxemia 12/13/2018  . Atrial fibrillation ([place]) 03/23/2017  . Prolonged Q-T interval on ECG 02/24/2017  . Hypokalemia 02/24/2017  . Acute ischemic stroke ([place]) 02/23/2017  . Malnutrition of moderate degree 12/24/2016  . Problem with dialysis access ([place]) 12/21/2016  . Acute ischemic  colitis ([place]) 05/16/2016  . Colitis 05/15/2016  . Rectal bleeding 05/15/2016  . Neurologic abnormality 11/19/2015  . Hyperkalemia 11/19/2015  . Anxiety 11/19/2015  . Insomnia 11/19/2015  . Gait instability   . Stroke-like symptom 11/16/2015  . Vestibular neuritis 11/16/2015  . Stroke (cerebrum) ([place]) 11/16/2015  . Dizziness 05/09/2015  . Ataxia 05/09/2015  . H/O: CVA (cerebrovascular accident) 05/09/2015  . Left facial numbness 05/09/2015  . Left leg numbness 05/09/2015  . Hyperlipidemia   . Acute on chronic diastolic heart failure ([place])   . Shortness of breath 04/01/2013  . HTN (hypertension) 04/01/2013  . Cholecystitis, acute 11/07/2012  . End-stage renal disease on hemodialysis ([place]) 11/07/2012  . Anemia in chronic kidney disease 11/07/2012    Past Surgical History:  Procedure Laterality Date  . AV FISTULA PLACEMENT     for dialysis  . AV FISTULA PLACEMENT Left 11/22/2015   Procedure: ARTERIOVENOUS (AV) FISTULA CREATION-LEFT BRACHIOCEPHALIC;  Surgeon: Serafina Mitchell, MD;  Location: [facility];  Service: Vascular;  Laterality: Left;  . AV FISTULA PLACEMENT Right 03/15/2020   Procedure: INSERTION OF ARTERIOVENOUS (AV) GORE-TEX GRAFT ARM ( BRACHIAL AXILLARY );  Surgeon: Katha Cabal, MD;  Location: [facility];  Service: Vascular;  Laterality: Right;  . BACK SURGERY    . CERVICAL FUSION    . CHOLECYSTECTOMY  12/02/2012   Procedure: LAPAROSCOPIC CHOLECYSTECTOMY WITH INTRAOPERATIVE CHOLANGIOGRAM;  Surgeon: Edward Jolly, MD;  Location: [place];  Service: General;  Laterality: N/A;  . EYE SURGERY Bilateral    cataract surgery  . EYE SURGERY Left 2019   laser  . HEMATOMA EVACUATION Left 12/24/2016   Procedure: EVACUATION HEMATOMA LEFT UPPER ARM;  Surgeon: Waynetta Sandy, MD;  Location: [place];  Service: Vascular;  Laterality: Left;  . I & D EXTREMITY Left 12/31/2016   Procedure: IRRIGATION AND DEBRIDEMENT EXTREMITY;  Surgeon: Angelia Mould, MD;  Location: [facility];   Service: Vascular;  Laterality: Left;  . INSERTION OF DIALYSIS CATHETER Right 12/24/2016   Procedure: INSERTION OF DIALYSIS CATHETER;  Surgeon: Waynetta Sandy, MD;  Location: [facility];  Service: Vascular;  Laterality: Right;  . INSERTION OF DIALYSIS CATHETER Right 02/04/2017   Procedure: INSERTION OF DIALYSIS CATHETER;  Surgeon: Waynetta Sandy, MD;  Location: [place];  Service: Vascular;  Laterality: Right;  . KIDNEY TRANSPLANT  1996  . PERIPHERAL VASCULAR CATHETERIZATION Left 10/23/2016   Procedure: Fistulagram;  Surgeon: Elam Dutch, MD;  Location: [facility] CV LAB;  Service: Cardiovascular;  Laterality: Left;  . PERIPHERAL VASCULAR THROMBECTOMY Right 04/16/2020   Procedure: PERIPHERAL VASCULAR THROMBECTOMY;  Surgeon: Katha Cabal, MD;  Location: [facility] CV LAB;  Service: Cardiovascular;  Laterality: Right;  . RESECTION OF ARTERIOVENOUS FISTULA ANEURYSM Left 11/22/2015   Procedure: RESECTION OF LEFT RADIOCEPHALIC FISTULA ANEURYSM ;  Surgeon: Serafina Mitchell, MD;  Location: [facility];  Service: Vascular;  Laterality: Left;  . REVISON OF ARTERIOVENOUS FISTULA Left 12/22/2016   Procedure: REVISON OF LEFT ARTERIOVENOUS FISTULA;  Surgeon: Waynetta Sandy, MD;  Location: [place] [facility];  Service: Vascular;  Laterality: Left;  . REVISON OF ARTERIOVENOUS FISTULA Left 02/04/2017   Procedure: REVISON OF LEFT UPPER ARM  ARTERIOVENOUS FISTULA;  Surgeon: Waynetta Sandy, MD;  Location: [facility];  Service: Vascular;  Laterality: Left;  . UPPER EXTREMITY ANGIOGRAPHY Bilateral 09/19/2019   Procedure: UPPER EXTREMITY ANGIOGRAPHY;  Surgeon: Katha Cabal, MD;  Location: [place] CV LAB;  Service: Cardiovascular;  Laterality: Bilateral;     OB History   No obstetric history on file.     Family History  Problem Relation Age of Onset  . Colon cancer Brother   . Cancer Brother   . Coronary artery disease Mother 68  . Hyperlipidemia Mother   . Hypertension Mother   .  Stroke Maternal Aunt   . Esophageal cancer Neg Hx   . Stomach cancer Neg Hx   . Rectal cancer Neg Hx     Social History   Tobacco Use  . Smoking status: Former Smoker    Types: Cigarettes    Quit date: 12/31/1991    Years since quitting: 29.2  . Smokeless tobacco: Never Used  Vaping Use  . Vaping Use: Never used  Substance Use Topics  . Alcohol use: No    Alcohol/week: 0.0 standard drinks  . Drug use: No    Home Medications Prior to Admission medications   Medication Sig Start Date End Date Taking? Authorizing Provider  acetaminophen (TYLENOL) 500 MG tablet Take 1 tablet (500 mg total) by mouth every 6 (six) hours as needed. 09/05/18   Law, Bea Graff, PA-C  allopurinol (ZYLOPRIM) 300 MG tablet Take 150 mg by mouth daily.     [provider]  ALPRAZolam Duanne Moron) 0.25 MG tablet Take 0.25 mg by mouth daily as needed for anxiety.     [provider]  aspirin EC 81 MG EC tablet Take 1 tablet (81 mg total) by mouth daily. 05/14/20   Black, Lezlie Octave, NP  atorvastatin (LIPITOR) 80 MG tablet Take 1 tablet (80 mg total) by mouth daily. 02/25/21 03/27/21  Dessa Phi, DO  atorvastatin (LIPITOR) 80 MG tablet TAKE 1 TABLET (80 MG TOTAL) BY MOUTH DAILY. 02/25/21 02/25/22  Dessa Phi, DO  AURYXIA 1 GM 210 MG(Fe) tablet Take 420 mg by mouth 3 (three) times daily with meals.     [provider]  B Complex-C-Folic Acid (DIALYVITE 628) 0.8 MG WAFR Take 1 tablet by mouth daily.  10/25/18   [provider]  busPIRone (BUSPAR) 10 MG tablet TAKE 1 TABLET (10 MG TOTAL) BY MOUTH TWO TIMES DAILY. 02/25/21 02/25/22  Dessa Phi, DO  carvedilol (COREG) 6.25 MG tablet Take 6.25 mg by mouth 2 (two) times daily. 08/28/20   [provider]  cinacalcet (SENSIPAR) 30 MG tablet Take 30 mg by mouth daily. 01/04/20   [provider]  lactulose (CHRONULAC) 10 GM/15ML solution Take 20 g by mouth daily as needed for mild constipation.    [provider]   losartan (COZAAR) 50 MG tablet Take 50 mg by mouth daily. 09/25/20   [provider]  ondansetron (ZOFRAN) 8 MG tablet Take 8 mg by mouth 2 (two) times daily as needed for nausea or vomiting.     [provider]  zolpidem (AMBIEN) 10 MG tablet Take 10 mg by mouth at bedtime as needed. 02/13/21   [provider]    Allergies    Sulfa antibiotics and Adhesive [tape]  Review of Systems   Review of Systems  Constitutional: Negative for fever.  Respiratory: Positive for cough and shortness of breath.   Cardiovascular: Positive for chest pain. Negative for leg swelling.  All other systems reviewed and are negative.   Physical Exam Updated Vital Signs BP (!) 166/82   Pulse (!) 108   Temp 98 F (36.7 C) (Oral)   Resp 19   SpO2 95%   Physical Exam Vitals and nursing note reviewed.  Constitutional:      Appearance: She is well-developed.  HENT:     Head: Normocephalic and atraumatic.     Right Ear: External ear normal.     Left Ear: External ear normal.     Nose: Nose normal.  Eyes:     General:        Right eye: No discharge.        Left eye: No discharge.  Neck:     Vascular: JVD present.  Cardiovascular:     Rate and Rhythm: Normal rate and regular rhythm.     Heart sounds: Murmur heard.    Pulmonary:     Effort: Pulmonary effort is normal. No tachypnea or accessory muscle usage.     Breath sounds: Examination of the right-lower field reveals rales. Examination of the left-lower field reveals rales. Rales present.  Abdominal:     Palpations: Abdomen is soft.     Tenderness: There is no abdominal tenderness.  Musculoskeletal:     Right lower leg: No edema.     Left lower leg: No edema.  Skin:    General: Skin is warm and dry.  Neurological:     Mental Status: She is alert.  Psychiatric:        Mood and Affect: Mood is not anxious.     ED Results / Procedures / Treatments   Labs (all labs ordered are listed, but only abnormal results  are displayed) Labs Reviewed  BASIC METABOLIC PANEL - Abnormal; Notable for the following components:      Result Value   Chloride 94 (*)    BUN 45 (*)    Creatinine, Ser 7.13 (*)    GFR, Estimated 6 (*)    Anion gap 16 (*)    All other components within normal limits  CBC WITH DIFFERENTIAL/PLATELET - Abnormal; Notable for the following components:   RBC 3.50 (*)    Hemoglobin 10.6 (*)    HCT 32.7 (*)    RDW 16.4 (*)    All other components within normal limits  BRAIN NATRIURETIC PEPTIDE - Abnormal; Notable for the following components:   B Natriuretic Peptide 3,215.6 (*)    All other components within normal limits  TROPONIN I (HIGH SENSITIVITY) - Abnormal; Notable for the following components:   Troponin I (High Sensitivity) 84 (*)    All other components within normal limits  RESP PANEL BY RT-PCR (FLU A&B, COVID) [id number]    EKG EKG Interpretation  Date/Time:  Sunday April 06 2021 11:51:16 EDT Ventricular Rate:  91 PR Interval:  182 QRS Duration: 98 QT Interval:  400 QTC Calculation: 493 R Axis:   44 Text Interpretation: Sinus rhythm Probable left atrial enlargement Left ventricular hypertrophy Abnrm T, consider ischemia, anterolateral lds Confirmed by Sherwood Gambler [phone]) on 04/06/2021 12:02:04 PM   Radiology DG Chest 2 View  Result Date: 04/06/2021 CLINICAL DATA:  Chest pain and dyspnea beginning this morning. EXAM: CHEST - 2 VIEW COMPARISON:  Prior today FINDINGS: Moderate cardiomegaly remains stable. Diffuse interstitial infiltrates show mild improvement since prior study, consistent with decreased interstitial edema. No evidence of focal consolidation or pleural effusion. IMPRESSION: Mild improvement in diffuse interstitial infiltrates, consistent with decreased interstitial edema. Stable  cardiomegaly. Electronically Signed   By: Marlaine Hind M.D.   On: 04/06/2021 12:56   DG Chest 2 View  Result Date: 04/06/2021 CLINICAL DATA:  Chest pain EXAM: CHEST - 2 VIEW  COMPARISON:  02/13/2021 FINDINGS: Cardiomegaly. Mild interstitial pulmonary edema. No pleural effusion or pneumothorax. Calcific aortic atherosclerosis. IMPRESSION: Cardiomegaly and mild interstitial pulmonary edema. Electronically Signed   By: Ulyses Jarred M.D.   On: 04/06/2021 02:41    Procedures Procedures   Angiocath insertion Performed by: Ephraim Hamburger  Consent: Verbal consent obtained. Risks and benefits: risks, benefits and alternatives were discussed Time out: Immediately prior to procedure a "time out" was called to verify the correct patient, procedure, equipment, support staff and site/side marked as required.  Preparation: Patient was prepped and draped in the usual sterile fashion.  Vein Location: left EJ  Gauge: 20  Normal blood return and flush without difficulty Patient tolerance: Patient tolerated the procedure well with no immediate complications.    Medications Ordered in ED Medications - No data to display  ED Course  I have reviewed the triage vital signs and the nursing notes.  Pertinent labs & imaging results that were available during my care of the patient were reviewed by me and considered in my medical decision making (see chart for details).    MDM Rules/Calculators/A&P                          Patient is in no distress. Troponin is near baseline and likely from ESRD.  Chest x-ray shows some mild pulmonary edema.  O2 sats around 90-92%.  While she is nondistressed, I did discuss with nephrology, Dr.Schertz, who will dialyze tonight. She will then come back to the ER for discharge.   Final Clinical Impression(s) / ED Diagnoses Final diagnoses:  Acute pulmonary edema [facility])    Rx / DC Orders ED Discharge Orders    None       Sherwood Gambler, MD 04/06/21 1535

## 2021-04-06 NOTE — ED Triage Notes (Signed)
Patient BIB [facility] from home. Complaint of exertional shortness of breath. Was seen and assessed for same earlier today. VSS. NAD.

## 2021-04-06 NOTE — ED Provider Notes (Signed)
[facility] EMERGENCY DEPARTMENT Provider Note  CSN: [id number] Arrival date & time: 04/06/21 0144  Chief Complaint(s) No chief complaint on file.  HPI [name] is a 70 y.o. female with a past medical history listed below including ESRD on dialysis MWF here for shortness of breath that began last night and around 9 PM.  Patient reported chest tightness and shortness of breath that was gradual onset.  Improved slightly after taking her blood pressure medicine.  Initially patient's blood pressure with EMS was in the 180s.  On arrival here blood pressures were in the 140s.  Upon arrival she reported that her pain had improved significantly.  She reports being "gassy."  No abdominal pain.  No nausea or vomiting.  HPI  Past Medical History Past Medical History:  Diagnosis Date  . Adenomatous polyp of colon 10/2010, 2006, 2015  . Anemia in CKD (chronic kidney disease) 11/07/2012   s/p blood transfusion.   . Arthritis   . CAD (coronary artery disease)    "something like that"  . CHF (congestive heart failure) ([place])   . Constipation   . Depression with anxiety   . Diverticula, colon   . 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  Had some early rejection then stable function for years, then had slow decline of function and went back on hemodialysis in 2012.  Gets HD TTS schedule at [facility] on [facility] still using L forearm AVF.     Marland Kitchen GERD (gastroesophageal reflux disease)   . GI bleed 2017   felt to be ischemic colitis, last colo 2015  . Headache   . Hyperlipidemia   . Hypertension   . Neurologic gait dysfunction   . Neuromuscular disorder (HCC)    neuropathy hand and legs  . Osteoporosis   . Pneumonia   . 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 Pulmonary edema 12/2019  . Stroke [facility]) 11/2015   TIA  . Weight loss, unintentional    Patient  Active Problem List   Diagnosis Date Noted  . Major depressive disorder, recurrent episode, moderate ([place]) 02/23/2021  . TIA (transient ischemic attack) 02/21/2021  . Prolonged QT interval 02/21/2021  . NSTEMI (non-ST elevated myocardial infarction) ([place]) 05/14/2020  . Volume overload 05/13/2020  . Hypotension after procedure 03/16/2020  . Chest pain 03/16/2020  . Change in mental state 03/15/2020  . Ventricular tachycardia ([place]) 03/15/2020  . Seizure ([place]) 03/15/2020  . Unresponsive episode 03/15/2020  . Pulmonary edema 01/01/2020  . Complication of vascular access for dialysis 12/21/2019  . Breakdown (mechanical) of surgically created arteriovenous fistula, initial encounter ([place]) 12/18/2019  . Weakness 10/11/2019  . 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 Dependence on renal dialysis ([place]) 07/24/2019  . Iron deficiency anemia, unspecified 06/09/2019  . Age-related osteoporosis without current pathological fracture 04/17/2019  . 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 Coagulation defect, unspecified ([place]) 04/17/2019  . Diarrhea, unspecified 04/17/2019  . Encounter for screening for depression 04/17/2019  . Headache, unspecified 04/17/2019  . 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 Pain, unspecified 04/17/2019  . Polyp of colon 04/17/2019  . Primary generalized (osteo)arthritis 04/17/2019  . Pruritus, unspecified 04/17/2019  . Pure hypercholesterolemia, unspecified 04/17/2019  . Secondary hyperparathyroidism of renal origin ([place]) 04/17/2019  . Gastro-esophageal reflux disease without esophagitis 04/17/2019  . Essential (primary) hypertension 04/17/2019  .  Hyperlipidemia, unspecified 04/17/2019  . Transient cerebral ischemic attack, unspecified 04/17/2019  . Hypoxemia 12/13/2018  . Atrial fibrillation ([place]) 03/23/2017  . Prolonged Q-T interval on ECG 02/24/2017  . Hypokalemia 02/24/2017  . Acute ischemic stroke ([place]) 02/23/2017  .  Malnutrition of moderate degree 12/24/2016  . Problem with dialysis access ([place]) 12/21/2016  . Acute ischemic colitis ([place]) 05/16/2016  . Colitis 05/15/2016  . Rectal bleeding 05/15/2016  . Neurologic abnormality 11/19/2015  . Hyperkalemia 11/19/2015  . Anxiety 11/19/2015  . Insomnia 11/19/2015  . Gait instability   . Stroke-like symptom 11/16/2015  . Vestibular neuritis 11/16/2015  . Stroke (cerebrum) ([place]) 11/16/2015  . Dizziness 05/09/2015  . Ataxia 05/09/2015  . H/O: CVA (cerebrovascular accident) 05/09/2015  . Left facial numbness 05/09/2015  . Left leg numbness 05/09/2015  . Hyperlipidemia   . Acute on chronic diastolic heart failure ([place])   . Shortness of breath 04/01/2013  . HTN (hypertension) 04/01/2013  . Cholecystitis, acute 11/07/2012  . End-stage renal disease on hemodialysis ([place]) 11/07/2012  . Anemia in chronic kidney disease 11/07/2012   Home Medication(s) Prior to Admission medications   Medication Sig Start Date End Date Taking? Authorizing Provider  acetaminophen (TYLENOL) 500 MG tablet Take 1 tablet (500 mg total) by mouth every 6 (six) hours as needed. 09/05/18   Law, Bea Graff, PA-C  allopurinol (ZYLOPRIM) 300 MG tablet Take 150 mg by mouth daily.     [provider]  ALPRAZolam Duanne Moron) 0.25 MG tablet Take 0.25 mg by mouth daily as needed for anxiety.     [provider]  aspirin EC 81 MG EC tablet Take 1 tablet (81 mg total) by mouth daily. 05/14/20   Black, Lezlie Octave, NP  atorvastatin (LIPITOR) 80 MG tablet Take 1 tablet (80 mg total) by mouth daily. 02/25/21 03/27/21  Dessa Phi, DO  atorvastatin (LIPITOR) 80 MG tablet TAKE 1 TABLET (80 MG TOTAL) BY MOUTH DAILY. 02/25/21 02/25/22  Dessa Phi, DO  AURYXIA 1 GM 210 MG(Fe) tablet Take 420 mg by mouth 3 (three) times daily with meals.     [provider]  B Complex-C-Folic Acid (DIALYVITE 341) 0.8 MG WAFR Take 1 tablet by mouth daily.  10/25/18   [provider]  busPIRone  (BUSPAR) 10 MG tablet TAKE 1 TABLET (10 MG TOTAL) BY MOUTH TWO TIMES DAILY. 02/25/21 02/25/22  Dessa Phi, DO  carvedilol (COREG) 6.25 MG tablet Take 6.25 mg by mouth 2 (two) times daily. 08/28/20   [provider]  cinacalcet (SENSIPAR) 30 MG tablet Take 30 mg by mouth daily. 01/04/20   [provider]  lactulose (CHRONULAC) 10 GM/15ML solution Take 20 g by mouth daily as needed for mild constipation.    [provider]  losartan (COZAAR) 50 MG tablet Take 50 mg by mouth daily. 09/25/20   [provider]  ondansetron (ZOFRAN) 8 MG tablet Take 8 mg by mouth 2 (two) times daily as needed for nausea or vomiting.     [provider]  zolpidem (AMBIEN) 10 MG tablet Take 10 mg by mouth at bedtime as needed. 02/13/21   [provider]  Past Surgical History Past Surgical History:  Procedure Laterality Date  . AV FISTULA PLACEMENT     for dialysis  . AV FISTULA PLACEMENT Left 11/22/2015   Procedure: ARTERIOVENOUS (AV) FISTULA CREATION-LEFT BRACHIOCEPHALIC;  Surgeon: Serafina [place], MD;  Location: [facility];  Service: Vascular;  Laterality: Left;  . AV FISTULA PLACEMENT Right 03/15/2020   Procedure: INSERTION OF ARTERIOVENOUS (AV) GORE-TEX GRAFT ARM ( BRACHIAL AXILLARY );  Surgeon: Katha Cabal, MD;  Location: [facility];  Service: Vascular;  Laterality: Right;  . BACK SURGERY    . CERVICAL FUSION    . CHOLECYSTECTOMY  12/02/2012   Procedure: LAPAROSCOPIC CHOLECYSTECTOMY WITH INTRAOPERATIVE CHOLANGIOGRAM;  Surgeon: Edward Jolly, MD;  Location: [facility];  Service: General;  Laterality: N/A;  . EYE SURGERY Bilateral    cataract surgery  . EYE SURGERY Left 2019   laser  . HEMATOMA EVACUATION Left 12/24/2016   Procedure: EVACUATION HEMATOMA LEFT UPPER ARM;  Surgeon: Waynetta Sandy, MD;  Location: [facility];  Service:  Vascular;  Laterality: Left;  . I & D EXTREMITY Left 12/31/2016   Procedure: IRRIGATION AND DEBRIDEMENT EXTREMITY;  Surgeon: Angelia Mould, MD;  Location: [facility];  Service: Vascular;  Laterality: Left;  . INSERTION OF DIALYSIS CATHETER Right 12/24/2016   Procedure: INSERTION OF DIALYSIS CATHETER;  Surgeon: Waynetta Sandy, MD;  Location: [facility];  Service: Vascular;  Laterality: Right;  . INSERTION OF DIALYSIS CATHETER Right 02/04/2017   Procedure: INSERTION OF DIALYSIS CATHETER;  Surgeon: Waynetta Sandy, MD;  Location: [facility];  Service: Vascular;  Laterality: Right;  . KIDNEY TRANSPLANT  1996  . PERIPHERAL VASCULAR CATHETERIZATION Left 10/23/2016   Procedure: Fistulagram;  Surgeon: Elam Dutch, MD;  Location: [facility];  Service: Cardiovascular;  Laterality: Left;  . PERIPHERAL VASCULAR THROMBECTOMY Right 04/16/2020   Procedure: PERIPHERAL VASCULAR THROMBECTOMY;  Surgeon: Katha Cabal, MD;  Location: [facility];  Service: Cardiovascular;  Laterality: Right;  . RESECTION OF ARTERIOVENOUS FISTULA ANEURYSM Left 11/22/2015   Procedure: RESECTION OF LEFT RADIOCEPHALIC FISTULA ANEURYSM ;  Surgeon: Serafina [place], MD;  Location: [facility];  Service: Vascular;  Laterality: Left;  . REVISON OF ARTERIOVENOUS FISTULA Left 12/22/2016   Procedure: REVISON OF LEFT ARTERIOVENOUS FISTULA;  Surgeon: Waynetta Sandy, MD;  Location: [facility];  Service: Vascular;  Laterality: Left;  . REVISON OF ARTERIOVENOUS FISTULA Left 02/04/2017   Procedure: REVISON OF LEFT UPPER ARM ARTERIOVENOUS FISTULA;  Surgeon: Waynetta Sandy, MD;  Location: [facility];  Service: Vascular;  Laterality: Left;  . UPPER EXTREMITY ANGIOGRAPHY Bilateral 09/19/2019   Procedure: UPPER EXTREMITY ANGIOGRAPHY;  Surgeon: Katha Cabal, MD;  Location: [facility];  Service: Cardiovascular;  Laterality: Bilateral;   Family History Family History  Problem Relation Age of Onset  . Colon  cancer Brother   . Cancer Brother   . Coronary artery disease Mother 36  . Hyperlipidemia Mother   . Hypertension Mother   . Stroke Maternal Aunt   . Esophageal cancer Neg Hx   . Stomach cancer Neg Hx   . Rectal cancer Neg Hx     Social History Social History   Tobacco Use  . Smoking status: Former Smoker    Types: Cigarettes    Quit date: 12/31/1991    Years since quitting: 29.2  . Smokeless tobacco: Never Used  Vaping Use  . Vaping Use: Never used  Substance Use Topics  . Alcohol use: No  Alcohol/week: 0.0 standard drinks  . Drug use: No   Allergies Sulfa antibiotics and Adhesive [tape]  Review of Systems Review of Systems All other systems are reviewed and are negative for acute change except as noted in the HPI  Physical Exam Vital Signs  I have reviewed the triage vital signs BP (!) 142/71 (BP Location: Right Leg)   Pulse 85   Temp 97.9 F (36.6 C) (Oral)   Resp 19   Ht 5\' 2"  (1.575 m)   Wt 54 kg   SpO2 98%   BMI 21.77 kg/m   Physical Exam Vitals reviewed.  Constitutional:      General: She is not in acute distress.    Appearance: She is well-developed. She is not diaphoretic.  HENT:     Head: Normocephalic and atraumatic.     Nose: Nose normal.  Eyes:     General: No scleral icterus.       Right eye: No discharge.        Left eye: No discharge.     Conjunctiva/sclera: Conjunctivae normal.     Pupils: Pupils are equal, round, and reactive to light.  Cardiovascular:     Rate and Rhythm: Normal rate and regular rhythm.     Chest Wall: Thrill present.     Heart sounds: No murmur heard. No friction rub. No gallop.      Arteriovenous access: right and left arteriovenous access is present. Pulmonary:     Effort: Pulmonary effort is normal. No respiratory distress.     Breath sounds: Normal breath sounds. No stridor. No rales.  Abdominal:     General: There is no distension.     Palpations: Abdomen is soft.     Tenderness: There is no abdominal  tenderness.  Musculoskeletal:        General: No tenderness.     Cervical back: Normal range of motion and neck supple.  Skin:    General: Skin is warm and dry.     Findings: No erythema or rash.  Neurological:     Mental Status: She is alert and oriented to person, place, and time.     ED Results and Treatments Labs (all labs ordered are listed, but only abnormal results are displayed) Labs Reviewed  CBC WITH DIFFERENTIAL/PLATELET - Abnormal; Notable for the following components:      Result Value   RBC 3.65 (*)    Hemoglobin 10.9 (*)    HCT 34.5 (*)    RDW 16.4 (*)    All other components within normal limits  COMPREHENSIVE METABOLIC PANEL - Abnormal; Notable for the following components:   Chloride 92 (*)    BUN 40 (*)    Creatinine, Ser 6.45 (*)    Albumin 3.2 (*)    GFR, Estimated 6 (*)    Anion gap 17 (*)    All other components within normal limits  TROPONIN I (HIGH SENSITIVITY) - Abnormal; Notable for the following components:   Troponin I (High Sensitivity) 77 (*)    All other components within normal limits  TROPONIN I (HIGH SENSITIVITY) - Abnormal; Notable for the following components:   Troponin I (High Sensitivity) 66 (*)    All other components within normal limits  EKG  EKG Interpretation  Date/Time:  Sunday April 06 2021 01:53:43 EDT Ventricular Rate:  86 PR Interval:  188 QRS Duration: 92 QT Interval:  422 QTC Calculation: 505 R Axis:   90 Text Interpretation: Sinus rhythm Probable left atrial enlargement Borderline right axis deviation Probable left ventricular hypertrophy Nonspecific T abnrm, anterolateral leads Prolonged QT interval Confirmed by Addison Lank [phone]) on 04/06/2021 4:43:01 AM      Radiology DG Chest 2 View  Result Date: 04/06/2021 CLINICAL DATA:  Chest pain EXAM: CHEST - 2 VIEW COMPARISON:  02/13/2021  FINDINGS: Cardiomegaly. Mild interstitial pulmonary edema. No pleural effusion or pneumothorax. Calcific aortic atherosclerosis. IMPRESSION: Cardiomegaly and mild interstitial pulmonary edema. Electronically Signed   By: Ulyses Jarred M.D.   On: 04/06/2021 02:41    Pertinent labs & imaging results that were available during my care of the patient were reviewed by me and considered in my medical decision making (see chart for details).  Medications Ordered in ED Medications  nitroGLYCERIN (NITROGLYN) 2 % ointment 1 inch (1 inch Topical Given 04/06/21 0259)                                                                                                                                    Procedures Procedures  (including critical care time)  Medical Decision Making / ED Course I have reviewed the nursing notes for this encounter and the patient's prior records (if available in EHR or on provided paperwork).   SYBOL MORRE was evaluated in Emergency Department on 04/06/2021 for the symptoms described in the history of present illness. She was evaluated in the context of the global COVID-19 pandemic, which necessitated consideration that the patient might be at risk for infection with the SARS-CoV-2 virus that causes COVID-19. Institutional protocols and algorithms that pertain to the evaluation of patients at risk for COVID-19 are in a state of rapid change based on information released by regulatory bodies including the CDC and federal and state organizations. These policies and algorithms were followed during the patient's care in the ED.  Patient presented with chest tightness and shortness of breath improved with her blood pressure medicine.  Initially blood pressures were elevated with systolics in the 267T with EMS.  Trended down in route and patient's symptoms have improved significantly.  Symptoms likely related to heart strain from hypertension. EKG without acute ischemic changes or  evidence of pericarditis.  Initial troponin elevated but less than her previous baseline. Second troponin down trended. Low suspicion for ACS.  Chest x-ray notable for mild pulmonary edema, likely related to slight volume overload and hypertension.  Patient is satting well on room air in no respiratory distress. Scheduled for dialysis tomorrow.  Rest of the labs were reassuring.  Presentation not classic for aortic dissection or esophageal perforation.  Low suspicion for pulmonary embolism.  The patient would like to go home.  Recommended she keep her session for  dialysis and reports that she has the bus that comes and picks her up so she will not notice.     Final Clinical Impression(s) / ED Diagnoses Final diagnoses:  Chest pain  SOB (shortness of breath)   The patient appears reasonably screened and/or stabilized for discharge and I doubt any other medical condition or other [facility] requiring further screening, evaluation, or treatment in the ED at this time prior to discharge. Safe for discharge with strict return precautions.  Disposition: Discharge  Condition: Good  I have discussed the results, Dx and Tx plan with the patient/family who expressed understanding and agree(s) with the plan. Discharge instructions discussed at length. The patient/family was given strict return precautions who verbalized understanding of the instructions. No further questions at time of discharge.    ED Discharge Orders    None      Follow Up: [name], MD [address] [place] [address] [phone]  Call  if you have not been called about your appointment      This chart was dictated using voice recognition software.  Despite best efforts to proofread,  errors can occur which can change the documentation meaning.   Fatima Blank, MD 04/06/21 [phone]

## 2021-04-06 NOTE — ED Triage Notes (Addendum)
Pt BIB EMS from home. Woke up with SOB. States that she had sharp chest pain that was relieved after taking losartan. Pt states feeling chest tightness without pain & SOB that has improved upon arrival to [facility].   Went to her MWF dialysis yesterday as scheduled without issues.   EMS vitals: 92% RA, 100% 2L  HR 90 BP 186/100 RR 22

## 2021-04-06 NOTE — ED Notes (Signed)
Received verbal report from [place] at this time

## 2021-04-06 NOTE — Procedures (Signed)
Asked to see patient for HD.  She was in ED earlier today then released to home, now returns w/ recurrent SOB.  CXR shows vasc congestion and early edema. Pt on room air w/ SpO2 91- 94%.  Pt c/o SOB most of the day today. No fever, prod cough or pleuritic CP. Has some mid chest heaviness. On nasal O2 in ED.  Not in distress. Exam shows +jvd and bilat rales at the bases. Abd benign, LE's trace edema. Alert and Ox 3. Plan is for HD this evening upstairs. Have d/w ED MD, plan is for ED dialysis, patient will return to ED after dialysis for reassessment prior to dc home. She knows she should go to her regular HD on Monday (tomorrow) as well.    I was present at this dialysis session, have reviewed the session itself and made  appropriate changes Kelly Splinter MD [place] pager [phone]   04/06/2021, 9:45 PM

## 2021-04-06 NOTE — ED Notes (Signed)
Ptar Called [phone]

## 2021-04-06 NOTE — ED Notes (Signed)
Pt transported to dialysis at this time.

## 2021-04-07 ENCOUNTER — Inpatient Hospital Stay: Payer: Self-pay | Admitting: Adult Health

## 2021-04-07 NOTE — ED Notes (Signed)
Received verbal report from [place] at this time. 3hrs of tx and 2.5 liters removed tolerated well.

## 2021-04-07 NOTE — ED Notes (Signed)
Son arrived for transport home

## 2021-04-07 NOTE — ED Provider Notes (Signed)
4:09 AM Patient reassessed after dialysis.  She is on room air with oxygen saturations of 96%.  She states that she feels like "the Lord came down and made me a new person".  Has no complaints at present.  Is comfortable with discharge.  Nursing attempting to coordinate transport home.  Vitals:   04/07/21 0134 04/07/21 0204 04/07/21 0234 04/07/21 0400  BP: (!) 113/45 (!) 109/50 (!) 123/53 114/61  Pulse: 86   87  Resp: 17 18 15 20   Temp: 98.6 F (37 C)   98.5 F (36.9 C)  TempSrc:    Oral  SpO2: 98%   97%      Antonietta Breach, PA-C 04/07/21 0410    Veryl Speak, MD 04/07/21 [phone]

## 2021-04-07 NOTE — ED Notes (Signed)
Provider at bedside at this time

## 2021-04-07 NOTE — ED Notes (Signed)
Waiting on transport at this time.

## 2021-04-07 NOTE — ED Notes (Signed)
Pt attempting to reach someone to pick her up at this time for when she is d/c.

## 2021-04-08 DIAGNOSIS — I214 Non-ST elevation (NSTEMI) myocardial infarction: Secondary | ICD-10-CM | POA: Diagnosis not present

## 2021-04-08 DIAGNOSIS — N186 End stage renal disease: Secondary | ICD-10-CM | POA: Diagnosis not present

## 2021-04-08 DIAGNOSIS — I472 Ventricular tachycardia: Secondary | ICD-10-CM | POA: Diagnosis not present

## 2021-04-08 DIAGNOSIS — M109 Gout, unspecified: Secondary | ICD-10-CM | POA: Diagnosis not present

## 2021-04-08 DIAGNOSIS — I2510 Atherosclerotic heart disease of native coronary artery without angina pectoris: Secondary | ICD-10-CM | POA: Diagnosis not present

## 2021-04-08 DIAGNOSIS — D559 Anemia due to enzyme disorder, unspecified: Secondary | ICD-10-CM | POA: Diagnosis not present

## 2021-04-08 DIAGNOSIS — I4891 Unspecified atrial fibrillation: Secondary | ICD-10-CM | POA: Diagnosis not present

## 2021-04-08 DIAGNOSIS — I132 Hypertensive heart and chronic kidney disease with heart failure and with stage 5 chronic kidney disease, or end stage renal disease: Secondary | ICD-10-CM | POA: Diagnosis not present

## 2021-04-08 DIAGNOSIS — I5032 Chronic diastolic (congestive) heart failure: Secondary | ICD-10-CM | POA: Diagnosis not present

## 2021-04-09 DIAGNOSIS — N186 End stage renal disease: Secondary | ICD-10-CM | POA: Diagnosis not present

## 2021-04-09 DIAGNOSIS — Z992 Dependence on renal dialysis: Secondary | ICD-10-CM | POA: Diagnosis not present

## 2021-04-09 DIAGNOSIS — N2581 Secondary hyperparathyroidism of renal origin: Secondary | ICD-10-CM | POA: Diagnosis not present

## 2021-04-11 DIAGNOSIS — N186 End stage renal disease: Secondary | ICD-10-CM | POA: Diagnosis not present

## 2021-04-11 DIAGNOSIS — N2581 Secondary hyperparathyroidism of renal origin: Secondary | ICD-10-CM | POA: Diagnosis not present

## 2021-04-11 DIAGNOSIS — Z992 Dependence on renal dialysis: Secondary | ICD-10-CM | POA: Diagnosis not present

## 2021-04-12 DIAGNOSIS — Z992 Dependence on renal dialysis: Secondary | ICD-10-CM | POA: Diagnosis not present

## 2021-04-12 DIAGNOSIS — N186 End stage renal disease: Secondary | ICD-10-CM | POA: Diagnosis not present

## 2021-04-12 DIAGNOSIS — T8612 Kidney transplant failure: Secondary | ICD-10-CM | POA: Diagnosis not present

## 2021-04-14 DIAGNOSIS — N186 End stage renal disease: Secondary | ICD-10-CM | POA: Diagnosis not present

## 2021-04-14 DIAGNOSIS — N2581 Secondary hyperparathyroidism of renal origin: Secondary | ICD-10-CM | POA: Diagnosis not present

## 2021-04-14 DIAGNOSIS — Z992 Dependence on renal dialysis: Secondary | ICD-10-CM | POA: Diagnosis not present

## 2021-04-16 DIAGNOSIS — Z992 Dependence on renal dialysis: Secondary | ICD-10-CM | POA: Diagnosis not present

## 2021-04-16 DIAGNOSIS — N2581 Secondary hyperparathyroidism of renal origin: Secondary | ICD-10-CM | POA: Diagnosis not present

## 2021-04-16 DIAGNOSIS — N186 End stage renal disease: Secondary | ICD-10-CM | POA: Diagnosis not present

## 2021-04-17 ENCOUNTER — Inpatient Hospital Stay: Payer: Self-pay | Admitting: Adult Health

## 2021-04-17 ENCOUNTER — Telehealth: Payer: Self-pay | Admitting: Adult Health

## 2021-04-17 NOTE — Telephone Encounter (Signed)
Pt called cancelled appt for today due to no transportation

## 2021-04-17 NOTE — Telephone Encounter (Signed)
Noted  

## 2021-04-17 NOTE — Progress Notes (Deleted)
[facility] [address] [place]. [place] [address] [phone]       [place] [name] Date of Birth:  1950/12/18 Medical Record Number:  [id number]   Reason for Referral:  hospital stroke follow up    SUBJECTIVE:   CHIEF COMPLAINT:  No chief complaint on file.   HPI:   [name] is a 70 y.o. female with history of multiple medical conditions including ESRD on HDwho presented on 02/21/2021 via EMS from home as a Code Stroke with acute onset of left facial droop, confusion and slurred speech.  Personally reviewed hospitalization pertinent progress notes, lab work and imaging summary provided.  Evaluated by Dr. Leonie Man - per progress note, reported similar episodes in the past but this one most severe.  MRI negative for acute stroke with possible diagnosis seizure vs TIA.  Imaging showed unchanged appearance of multiple chronic microhemorrhages and chronic small vessel disease.  EEG negative for seizures or epileptiform discharges.  Recommended continuation of aspirin at discharge.  LDL 117 and advised to increase atorvastatin to 80 mg daily.  Other stroke risk factors include advanced age, former tobacco use, prior history of stroke, CAD and CHF.  Other active problems include likely baseline dementia and ESRD.  Evaluated by therapies and recommended [facility] PT/OT as well as 24/7 supervision and discharged home in stable condition on 02/25/2021.  Seizure vs TIA: Unchanged appearance of multiple chronic microhemorrhages and chronic small vessel disease on MRI without evidence of new stroke  Code Stroke CT head No acute intracranial abnormality. ASPECTS 10.     MRA - No acute intracranial abnormality. Unchanged appearance of multiple chronic microhemorrhages and chronic small vessel disease. Mild left P1-2 junction stenosis. Otherwise normal MRA.  Carotid Doppler - Right and Left Carotid: The extracranial vessels were near-normal  with only minimal wall thickening or plaque.  2D Echo-ejection fraction 60 to 65%.  Anterior apical and apex hypokinesis.    LDL 117  HgbA1c 5.0  VTE prophylaxis - SCD's  aspirin 81 mg daily prior to admission, now on aspirin 81 mg daily.   Therapy recommendations:   HH PT/OT  Disposition:  home        ROS:   14 system review of systems performed and negative with exception of ***  PMH:  Past Medical History:  Diagnosis Date  . Adenomatous polyp of colon 10/2010, 2006, 2015  . Anemia in CKD (chronic kidney disease) 11/07/2012   s/p blood transfusion.   . Arthritis   . CAD (coronary artery disease)    "something like that"  . CHF (congestive heart failure) ([place])   . Constipation   . Depression with anxiety   . Diverticula, colon   . 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  Had some early rejection then stable function for years, then had slow decline of function and went back on hemodialysis in 2012.  Gets HD TTS schedule at [facility] still using L forearm AVF.     Marland Kitchen GERD (gastroesophageal reflux disease)   . GI bleed 2017   felt to be ischemic colitis, last colo 2015  . Headache   . Hyperlipidemia   . Hypertension   . Neurologic gait dysfunction   . Neuromuscular disorder (HCC)    neuropathy hand and legs  . Osteoporosis   . Pneumonia   . Pseudoaneurysm of surgical  AV fistula (HCC)    left upper arm  . Pulmonary edema 12/2019  . Stroke [facility]) 11/2015   TIA  . Weight loss, unintentional     PSH:  Past Surgical History:  Procedure Laterality Date  . AV FISTULA PLACEMENT     for dialysis  . AV FISTULA PLACEMENT Left 11/22/2015   Procedure: ARTERIOVENOUS (AV) FISTULA CREATION-LEFT BRACHIOCEPHALIC;  Surgeon: Serafina Mitchell, MD;  Location: [facility];  Service: Vascular;  Laterality: Left;  . AV FISTULA PLACEMENT Right 03/15/2020   Procedure: INSERTION OF  ARTERIOVENOUS (AV) GORE-TEX GRAFT ARM ( BRACHIAL AXILLARY );  Surgeon: Katha Cabal, MD;  Location: [facility];  Service: Vascular;  Laterality: Right;  . BACK SURGERY    . CERVICAL FUSION    . CHOLECYSTECTOMY  12/02/2012   Procedure: LAPAROSCOPIC CHOLECYSTECTOMY WITH INTRAOPERATIVE CHOLANGIOGRAM;  Surgeon: Edward Jolly, MD;  Location: [place];  Service: General;  Laterality: N/A;  . EYE SURGERY Bilateral    cataract surgery  . EYE SURGERY Left 2019   laser  . HEMATOMA EVACUATION Left 12/24/2016   Procedure: EVACUATION HEMATOMA LEFT UPPER ARM;  Surgeon: Waynetta Sandy, MD;  Location: [place];  Service: Vascular;  Laterality: Left;  . I & D EXTREMITY Left 12/31/2016   Procedure: IRRIGATION AND DEBRIDEMENT EXTREMITY;  Surgeon: Angelia Mould, MD;  Location: [place];  Service: Vascular;  Laterality: Left;  . INSERTION OF DIALYSIS CATHETER Right 12/24/2016   Procedure: INSERTION OF DIALYSIS CATHETER;  Surgeon: Waynetta Sandy, MD;  Location: [place];  Service: Vascular;  Laterality: Right;  . INSERTION OF DIALYSIS CATHETER Right 02/04/2017   Procedure: INSERTION OF DIALYSIS CATHETER;  Surgeon: Waynetta Sandy, MD;  Location: [facility];  Service: Vascular;  Laterality: Right;  . KIDNEY TRANSPLANT  1996  . PERIPHERAL VASCULAR CATHETERIZATION Left 10/23/2016   Procedure: Fistulagram;  Surgeon: Elam Dutch, MD;  Location: [place] [facility];  Service: Cardiovascular;  Laterality: Left;  . PERIPHERAL VASCULAR THROMBECTOMY Right 04/16/2020   Procedure: PERIPHERAL VASCULAR THROMBECTOMY;  Surgeon: Katha Cabal, MD;  Location: [place] [facility];  Service: Cardiovascular;  Laterality: Right;  . RESECTION OF ARTERIOVENOUS FISTULA ANEURYSM Left 11/22/2015   Procedure: RESECTION OF LEFT RADIOCEPHALIC FISTULA ANEURYSM ;  Surgeon: Serafina Mitchell, MD;  Location: [place];  Service: Vascular;  Laterality: Left;  . REVISON OF ARTERIOVENOUS FISTULA Left 12/22/2016   Procedure:  REVISON OF LEFT ARTERIOVENOUS FISTULA;  Surgeon: Waynetta Sandy, MD;  Location: [place];  Service: Vascular;  Laterality: Left;  . REVISON OF ARTERIOVENOUS FISTULA Left 02/04/2017   Procedure: REVISON OF LEFT UPPER ARM ARTERIOVENOUS FISTULA;  Surgeon: Waynetta Sandy, MD;  Location: [place];  Service: Vascular;  Laterality: Left;  . UPPER EXTREMITY ANGIOGRAPHY Bilateral 09/19/2019   Procedure: UPPER EXTREMITY ANGIOGRAPHY;  Surgeon: Katha Cabal, MD;  Location: [place] [facility];  Service: Cardiovascular;  Laterality: Bilateral;    Social History:  Social History   Socioeconomic History  . Marital status: Widowed    Spouse name: Not on file  . Number of children: 2  . Years of education: Not on file  . Highest education level: Not on file  Occupational History  . Not on file  Tobacco Use  . Smoking status: Former Smoker    Types: Cigarettes    Quit date: 12/31/1991    Years since quitting: 29.3  . Smokeless tobacco: Never Used  Vaping Use  . Vaping Use: Never used  Substance and Sexual Activity  . Alcohol use: No    Alcohol/week: 0.0 standard drinks  . Drug use: No  . Sexual activity: Not Currently  Other Topics Concern  . Not on file  Social History Narrative   Living with her mother    Right handed   Caffeine: only decaf clears ("no brown sodas" or coffee d/t kidney disease)   Low potassium foods d/t kidney disease   Social Determinants of Health   Financial Resource Strain: Not on file  Food Insecurity: Not on file  Transportation Needs: Not on file  Physical Activity: Not on file  Stress: Not on file  Social Connections: Not on file  Intimate Partner Violence: Not on file    Family History:  Family History  Problem Relation Age of Onset  . Colon cancer Brother   . Cancer Brother   . Coronary artery disease Mother 70  . Hyperlipidemia Mother   . Hypertension Mother   . Stroke Maternal Aunt   . Esophageal cancer Neg Hx   . Stomach  cancer Neg Hx   . Rectal cancer Neg Hx     Medications:   Current Outpatient Medications on File Prior to Visit  Medication Sig Dispense Refill  . acetaminophen (TYLENOL) 500 MG tablet Take 1 tablet (500 mg total) by mouth every 6 (six) hours as needed. 30 tablet 0  . allopurinol (ZYLOPRIM) 300 MG tablet Take 150 mg by mouth daily.     Marland Kitchen ALPRAZolam (XANAX) 0.25 MG tablet Take 0.25 mg by mouth daily as needed for anxiety.     Marland Kitchen aspirin EC 81 MG EC tablet Take 1 tablet (81 mg total) by mouth daily.    Marland Kitchen atorvastatin (LIPITOR) 80 MG tablet Take 1 tablet (80 mg total) by mouth daily. 30 tablet 0  . atorvastatin (LIPITOR) 80 MG tablet TAKE 1 TABLET (80 MG TOTAL) BY MOUTH DAILY. 30 tablet 0  . AURYXIA 1 GM 210 MG(Fe) tablet Take 420 mg by mouth 3 (three) times daily with meals.     . B Complex-C-Folic Acid (DIALYVITE 096) 0.8 MG WAFR Take 1 tablet by mouth daily.   11  . busPIRone (BUSPAR) 10 MG tablet TAKE 1 TABLET (10 MG TOTAL) BY MOUTH TWO TIMES DAILY. 60 tablet 0  . carvedilol (COREG) 6.25 MG tablet Take 6.25 mg by mouth 2 (two) times daily.    . cinacalcet (SENSIPAR) 30 MG tablet Take 30 mg by mouth daily.    Marland Kitchen lactulose (CHRONULAC) 10 GM/15ML solution Take 20 g by mouth daily as needed for mild constipation.    Marland Kitchen losartan (COZAAR) 50 MG tablet Take 50 mg by mouth daily.    . ondansetron (ZOFRAN) 8 MG tablet Take 8 mg by mouth 2 (two) times daily as needed for nausea or vomiting.   0  . zolpidem (AMBIEN) 10 MG tablet Take 10 mg by mouth at bedtime as needed.     No current facility-administered medications on file prior to visit.    Allergies:   Allergies  Allergen Reactions  . Sulfa Antibiotics Other (See Comments)    Per patient, both parents allergic-so will not take  . Adhesive [Tape] Itching      OBJECTIVE:  Physical Exam  There were no vitals filed for this visit. There is no height or weight on file to calculate BMI. No exam data present  No flowsheet data found.    General: well developed, well nourished, seated, in no evident distress Head: head normocephalic  and atraumatic.   Neck: supple with no carotid or supraclavicular bruits Cardiovascular: regular rate and rhythm, no murmurs Musculoskeletal: no deformity Skin:  no rash/petichiae Vascular:  Normal pulses all extremities   Neurologic Exam Mental Status: Awake and fully alert. Oriented to place and time. Recent and remote memory intact. Attention span, concentration and fund of knowledge appropriate. Mood and affect appropriate.  Cranial Nerves: Fundoscopic exam reveals sharp disc margins. Pupils equal, briskly reactive to light. Extraocular movements full without nystagmus. Visual fields full to confrontation. Hearing intact. Facial sensation intact. Face, tongue, palate moves normally and symmetrically.  Motor: Normal bulk and tone. Normal strength in all tested extremity muscles Sensory.: intact to touch , pinprick , position and vibratory sensation.  Coordination: Rapid alternating movements normal in all extremities. Finger-to-nose and heel-to-shin performed accurately bilaterally. Gait and Station: Arises from chair without difficulty. Stance is normal. Gait demonstrates normal stride length and balance with ***. Tandem walk and heel toe ***.  Reflexes: 1+ and symmetric. Toes downgoing.     NIHSS  *** Modified Rankin  ***      ASSESSMENT: [name] is a 71 y.o. year old female presented with left facial droop, confusion and slurred speech on 02/21/2021 possibly in setting of TIA vs seizure with prior similar episodes and MRI negative for acute infarct. Vascular risk factors include hx of prior stroke, chronic cerebral microhemorrhages, HTN, HLD, advanced age, former tobacco use, CAD and CHF.      PLAN:  1. TIA : Residual deficit: ***. Continue aspirin 81 mg daily  and atorvastatin for secondary stroke prevention.  Discussed secondary stroke prevention measures and  importance of close PCP follow up for aggressive stroke risk factor management  2. HTN: BP goal <130/90.  Stable on *** per PCP 3. HLD: LDL goal <70. Recent LDL 117 - increased atorvastatin to 80 mg daily during recent stroke admission.  4.     Follow up in *** or call earlier if needed   CC:  [facility] provider: Dr. Leonie Man PCP: [name], MD    I spent *** minutes of face-to-face and non-face-to-face time with patient.  This included previsit chart review, lab review, study review, order entry, electronic health record documentation, patient education regarding recent stroke, residual deficits, importance of managing stroke risk factors and answered all questions to patient satisfaction     Frann Rider, [facility]  [facility] [address] [place], [place] [address]  Phone [phone] Fax [phone] Note: This document was prepared with digital dictation and possible smart phrase technology. Any transcriptional errors that result from this process are unintentional.

## 2021-04-18 DIAGNOSIS — N2581 Secondary hyperparathyroidism of renal origin: Secondary | ICD-10-CM | POA: Diagnosis not present

## 2021-04-18 DIAGNOSIS — R457 State of emotional shock and stress, unspecified: Secondary | ICD-10-CM | POA: Diagnosis not present

## 2021-04-18 DIAGNOSIS — Z992 Dependence on renal dialysis: Secondary | ICD-10-CM | POA: Diagnosis not present

## 2021-04-18 DIAGNOSIS — N186 End stage renal disease: Secondary | ICD-10-CM | POA: Diagnosis not present

## 2021-04-21 DIAGNOSIS — Z992 Dependence on renal dialysis: Secondary | ICD-10-CM | POA: Diagnosis not present

## 2021-04-21 DIAGNOSIS — N186 End stage renal disease: Secondary | ICD-10-CM | POA: Diagnosis not present

## 2021-04-21 DIAGNOSIS — N2581 Secondary hyperparathyroidism of renal origin: Secondary | ICD-10-CM | POA: Diagnosis not present

## 2021-04-23 DIAGNOSIS — N2581 Secondary hyperparathyroidism of renal origin: Secondary | ICD-10-CM | POA: Diagnosis not present

## 2021-04-23 DIAGNOSIS — N186 End stage renal disease: Secondary | ICD-10-CM | POA: Diagnosis not present

## 2021-04-23 DIAGNOSIS — Z992 Dependence on renal dialysis: Secondary | ICD-10-CM | POA: Diagnosis not present

## 2021-04-24 DIAGNOSIS — I4891 Unspecified atrial fibrillation: Secondary | ICD-10-CM | POA: Diagnosis not present

## 2021-04-24 DIAGNOSIS — M109 Gout, unspecified: Secondary | ICD-10-CM | POA: Diagnosis not present

## 2021-04-24 DIAGNOSIS — I472 Ventricular tachycardia: Secondary | ICD-10-CM | POA: Diagnosis not present

## 2021-04-24 DIAGNOSIS — D559 Anemia due to enzyme disorder, unspecified: Secondary | ICD-10-CM | POA: Diagnosis not present

## 2021-04-24 DIAGNOSIS — N186 End stage renal disease: Secondary | ICD-10-CM | POA: Diagnosis not present

## 2021-04-24 DIAGNOSIS — I5032 Chronic diastolic (congestive) heart failure: Secondary | ICD-10-CM | POA: Diagnosis not present

## 2021-04-24 DIAGNOSIS — I132 Hypertensive heart and chronic kidney disease with heart failure and with stage 5 chronic kidney disease, or end stage renal disease: Secondary | ICD-10-CM | POA: Diagnosis not present

## 2021-04-24 DIAGNOSIS — I2510 Atherosclerotic heart disease of native coronary artery without angina pectoris: Secondary | ICD-10-CM | POA: Diagnosis not present

## 2021-04-24 DIAGNOSIS — I214 Non-ST elevation (NSTEMI) myocardial infarction: Secondary | ICD-10-CM | POA: Diagnosis not present

## 2021-04-25 DIAGNOSIS — Z992 Dependence on renal dialysis: Secondary | ICD-10-CM | POA: Diagnosis not present

## 2021-04-25 DIAGNOSIS — N186 End stage renal disease: Secondary | ICD-10-CM | POA: Diagnosis not present

## 2021-04-25 DIAGNOSIS — N2581 Secondary hyperparathyroidism of renal origin: Secondary | ICD-10-CM | POA: Diagnosis not present

## 2021-04-26 ENCOUNTER — Encounter (HOSPITAL_COMMUNITY): Payer: Self-pay

## 2021-04-26 ENCOUNTER — Emergency Department (HOSPITAL_COMMUNITY): Payer: Medicare HMO

## 2021-04-26 ENCOUNTER — Emergency Department (HOSPITAL_COMMUNITY)
Admission: EM | Admit: 2021-04-26 | Discharge: 2021-04-26 | Disposition: A | Discharge status: Home / Self Care | Payer: Medicare HMO | Attending: Emergency Medicine | Admitting: Emergency Medicine

## 2021-04-26 DIAGNOSIS — R0602 Shortness of breath: Secondary | ICD-10-CM | POA: Diagnosis not present

## 2021-04-26 DIAGNOSIS — N186 End stage renal disease: Secondary | ICD-10-CM | POA: Diagnosis not present

## 2021-04-26 DIAGNOSIS — R059 Cough, unspecified: Secondary | ICD-10-CM | POA: Diagnosis not present

## 2021-04-26 DIAGNOSIS — I132 Hypertensive heart and chronic kidney disease with heart failure and with stage 5 chronic kidney disease, or end stage renal disease: Secondary | ICD-10-CM | POA: Diagnosis not present

## 2021-04-26 DIAGNOSIS — Z992 Dependence on renal dialysis: Secondary | ICD-10-CM | POA: Insufficient documentation

## 2021-04-26 DIAGNOSIS — Z87891 Personal history of nicotine dependence: Secondary | ICD-10-CM | POA: Insufficient documentation

## 2021-04-26 DIAGNOSIS — I5033 Acute on chronic diastolic (congestive) heart failure: Secondary | ICD-10-CM | POA: Diagnosis not present

## 2021-04-26 DIAGNOSIS — I2510 Atherosclerotic heart disease of native coronary artery without angina pectoris: Secondary | ICD-10-CM | POA: Diagnosis not present

## 2021-04-26 DIAGNOSIS — R0902 Hypoxemia: Secondary | ICD-10-CM | POA: Diagnosis not present

## 2021-04-26 DIAGNOSIS — Z7982 Long term (current) use of aspirin: Secondary | ICD-10-CM | POA: Insufficient documentation

## 2021-04-26 DIAGNOSIS — I517 Cardiomegaly: Secondary | ICD-10-CM | POA: Diagnosis not present

## 2021-04-26 LAB — BASIC METABOLIC PANEL
Anion gap: 13 (ref 5–15)
BUN: 24 mg/dL — ABNORMAL HIGH (ref 8–23)
CO2: 32 mmol/L (ref 22–32)
Calcium: 9.3 mg/dL (ref 8.9–10.3)
Chloride: 95 mmol/L — ABNORMAL LOW (ref 98–111)
Creatinine, Ser: 4.19 mg/dL — ABNORMAL HIGH (ref 0.44–1.00)
GFR, Estimated: 11 mL/min — ABNORMAL LOW (ref >60)
Glucose, Bld: 79 mg/dL (ref 70–99)
Potassium: 4.2 mmol/L (ref 3.5–5.1)
Sodium: 140 mmol/L (ref 135–145)

## 2021-04-26 LAB — CBC
HCT: 28.5 % — ABNORMAL LOW (ref 36.0–46.0)
Hemoglobin: 9.2 g/dL — ABNORMAL LOW (ref 12.0–15.0)
MCH: 30.9 pg (ref 26.0–34.0)
MCHC: 32.3 g/dL (ref 30.0–36.0)
MCV: 95.6 fL (ref 80.0–100.0)
Platelets: 184 10*3/uL (ref 150–400)
RBC: 2.98 MIL/uL — ABNORMAL LOW (ref 3.87–5.11)
RDW: 17 % — ABNORMAL HIGH (ref 11.5–15.5)
WBC: 4.8 10*3/uL (ref 4.0–10.5)
nRBC: 0 % (ref 0.0–0.2)

## 2021-04-26 MED ORDER — ALBUTEROL SULFATE HFA 108 (90 BASE) MCG/ACT IN AERS
2.0000 | INHALATION_SPRAY | RESPIRATORY_TRACT | Status: DC | PRN
  Administered 2021-04-26: 2 via RESPIRATORY_TRACT
  Filled 2021-04-26: qty 6.7

## 2021-04-26 MED ORDER — ONDANSETRON 4 MG PO TBDP: 4.0000 mg | ORAL_TABLET | Freq: Once | ORAL | Status: DC

## 2021-04-26 NOTE — ED Triage Notes (Signed)
Patient BIB [facility] from home, ESRD M/W/F, reports she was getting undressed tonight and started having a cough fit, reports her cough was productive and she got very short of breath.

## 2021-04-26 NOTE — ED Notes (Signed)
PT given happy meal.

## 2021-04-26 NOTE — ED Provider Notes (Signed)
[facility] EMERGENCY DEPARTMENT Provider Note  CSN: [id number] Arrival date & time: 04/26/21 0349  Chief Complaint(s) Shortness of Breath  HPI [name] is a 70 y.o. female with a past medical history listed below including ESRD on dialysis MWF who presents to the emergency department with shortness of breath after severe coughing spell.  She reports that she has been having productive cough for several days.  Tonight the coughing spell was severe and she became extremely short of breath.  This prompted the call to EMS.  Patient reports that her shortness of breath has resolved.  Reports that she went to dialysis yesterday and was at her dry weight.  Denied any associated chest pain.  Currently she is asymptomatic.  Denies any other physical complaints.  HPI  Past Medical History Past Medical History:  Diagnosis Date  . Adenomatous polyp of colon 10/2010, 2006, 2015  . Anemia in CKD (chronic kidney disease) 11/07/2012   s/p blood transfusion.   . Arthritis   . CAD (coronary artery disease)    "something like that"  . CHF (congestive heart failure) ([place])   . Constipation   . Depression with anxiety   . Diverticula, colon   . 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  Had some early rejection then stable function for years, then had slow decline of function and went back on hemodialysis in 2012.  Gets HD TTS schedule at [facility] on [facility] still using L forearm AVF.     Marland Kitchen GERD (gastroesophageal reflux disease)   . GI bleed 2017   felt to be ischemic colitis, last colo 2015  . Headache   . Hyperlipidemia   . Hypertension   . Neurologic gait dysfunction   . Neuromuscular disorder (HCC)    neuropathy hand and legs  . Osteoporosis   . Pneumonia   . 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 Pulmonary edema 12/2019  . Stroke [facility]) 11/2015    TIA  . Weight loss, unintentional    Patient Active Problem List   Diagnosis Date Noted  . Major depressive disorder, recurrent episode, moderate ([place]) 02/23/2021  . TIA (transient ischemic attack) 02/21/2021  . Prolonged QT interval 02/21/2021  . NSTEMI (non-ST elevated myocardial infarction) ([place]) 05/14/2020  . Volume overload 05/13/2020  . Hypotension after procedure 03/16/2020  . Chest pain 03/16/2020  . Change in mental state 03/15/2020  . Ventricular tachycardia ([place]) 03/15/2020  . Seizure ([place]) 03/15/2020  . Unresponsive episode 03/15/2020  . Pulmonary edema 01/01/2020  . Complication of vascular access for dialysis 12/21/2019  . Breakdown (mechanical) of surgically created arteriovenous fistula, initial encounter ([place]) 12/18/2019  . Weakness 10/11/2019  . 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 Dependence on renal dialysis ([place]) 07/24/2019  . Iron deficiency anemia, unspecified 06/09/2019  . Age-related osteoporosis without current pathological fracture 04/17/2019  . 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 Coagulation defect, unspecified ([place]) 04/17/2019  . Diarrhea, unspecified 04/17/2019  . Encounter for screening for depression 04/17/2019  . Headache, unspecified 04/17/2019  . 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 Pain, unspecified 04/17/2019  . Polyp of colon 04/17/2019  . Primary generalized (osteo)arthritis 04/17/2019  . Pruritus, unspecified 04/17/2019  . Pure hypercholesterolemia, unspecified 04/17/2019  . Secondary hyperparathyroidism of renal origin ([place]) 04/17/2019  . Gastro-esophageal reflux disease without esophagitis 04/17/2019  .  Essential (primary) hypertension 04/17/2019  . Hyperlipidemia, unspecified 04/17/2019  . Transient cerebral ischemic attack, unspecified 04/17/2019  . Hypoxemia 12/13/2018  . Atrial fibrillation ([place]) 03/23/2017  . Prolonged Q-T interval on ECG 02/24/2017  . Hypokalemia 02/24/2017  .  Acute ischemic stroke ([place]) 02/23/2017  . Malnutrition of moderate degree 12/24/2016  . Problem with dialysis access ([place]) 12/21/2016  . Acute ischemic colitis ([place]) 05/16/2016  . Colitis 05/15/2016  . Rectal bleeding 05/15/2016  . Neurologic abnormality 11/19/2015  . Hyperkalemia 11/19/2015  . Anxiety 11/19/2015  . Insomnia 11/19/2015  . Gait instability   . Stroke-like symptom 11/16/2015  . Vestibular neuritis 11/16/2015  . Stroke (cerebrum) ([place]) 11/16/2015  . Dizziness 05/09/2015  . Ataxia 05/09/2015  . H/O: CVA (cerebrovascular accident) 05/09/2015  . Left facial numbness 05/09/2015  . Left leg numbness 05/09/2015  . Hyperlipidemia   . Acute on chronic diastolic heart failure ([place])   . Shortness of breath 04/01/2013  . HTN (hypertension) 04/01/2013  . Cholecystitis, acute 11/07/2012  . End-stage renal disease on hemodialysis ([place]) 11/07/2012  . Anemia in chronic kidney disease 11/07/2012   Home Medication(s) Prior to Admission medications   Medication Sig Start Date End Date Taking? Authorizing Provider  acetaminophen (TYLENOL) 500 MG tablet Take 1 tablet (500 mg total) by mouth every 6 (six) hours as needed. 09/05/18   Law, Bea Graff, PA-C  allopurinol (ZYLOPRIM) 300 MG tablet Take 150 mg by mouth daily.     [provider]  ALPRAZolam Duanne Moron) 0.25 MG tablet Take 0.25 mg by mouth daily as needed for anxiety.     [provider]  aspirin EC 81 MG EC tablet Take 1 tablet (81 mg total) by mouth daily. 05/14/20   Black, Lezlie Octave, NP  atorvastatin (LIPITOR) 80 MG tablet Take 1 tablet (80 mg total) by mouth daily. 02/25/21 03/27/21  Dessa Phi, DO  atorvastatin (LIPITOR) 80 MG tablet TAKE 1 TABLET (80 MG TOTAL) BY MOUTH DAILY. 02/25/21 02/25/22  Dessa Phi, DO  AURYXIA 1 GM 210 MG(Fe) tablet Take 420 mg by mouth 3 (three) times daily with meals.     [provider]  B Complex-C-Folic Acid (DIALYVITE 686) 0.8 MG WAFR Take 1 tablet by mouth daily.   10/25/18   [provider]  busPIRone (BUSPAR) 10 MG tablet TAKE 1 TABLET (10 MG TOTAL) BY MOUTH TWO TIMES DAILY. 02/25/21 02/25/22  Dessa Phi, DO  carvedilol (COREG) 6.25 MG tablet Take 6.25 mg by mouth 2 (two) times daily. 08/28/20   [provider]  cinacalcet (SENSIPAR) 30 MG tablet Take 30 mg by mouth daily. 01/04/20   [provider]  lactulose (CHRONULAC) 10 GM/15ML solution Take 20 g by mouth daily as needed for mild constipation.    [provider]  losartan (COZAAR) 50 MG tablet Take 50 mg by mouth daily. 09/25/20   [provider]  ondansetron (ZOFRAN) 8 MG tablet Take 8 mg by mouth 2 (two) times daily as needed for nausea or vomiting.     [provider]  zolpidem (AMBIEN) 10 MG tablet Take 10 mg by mouth at bedtime as needed. 02/13/21   [provider]  Past Surgical History Past Surgical History:  Procedure Laterality Date  . AV FISTULA PLACEMENT     for dialysis  . AV FISTULA PLACEMENT Left 11/22/2015   Procedure: ARTERIOVENOUS (AV) FISTULA CREATION-LEFT BRACHIOCEPHALIC;  Surgeon: Serafina Mitchell, MD;  Location: [place];  Service: Vascular;  Laterality: Left;  . AV FISTULA PLACEMENT Right 03/15/2020   Procedure: INSERTION OF ARTERIOVENOUS (AV) GORE-TEX GRAFT ARM ( BRACHIAL AXILLARY );  Surgeon: Katha Cabal, MD;  Location: [facility];  Service: Vascular;  Laterality: Right;  . BACK SURGERY    . CERVICAL FUSION    . CHOLECYSTECTOMY  12/02/2012   Procedure: LAPAROSCOPIC CHOLECYSTECTOMY WITH INTRAOPERATIVE CHOLANGIOGRAM;  Surgeon: Edward Jolly, MD;  Location: [place];  Service: General;  Laterality: N/A;  . EYE SURGERY Bilateral    cataract surgery  . EYE SURGERY Left 2019   laser  . HEMATOMA EVACUATION Left 12/24/2016   Procedure: EVACUATION HEMATOMA LEFT UPPER ARM;  Surgeon: Waynetta Sandy, MD;  Location: [place];  Service: Vascular;  Laterality: Left;  . I & D EXTREMITY Left 12/31/2016   Procedure: IRRIGATION AND DEBRIDEMENT EXTREMITY;  Surgeon: Angelia Mould, MD;  Location: [place];  Service: Vascular;  Laterality: Left;  . INSERTION OF DIALYSIS CATHETER Right 12/24/2016   Procedure: INSERTION OF DIALYSIS CATHETER;  Surgeon: Waynetta Sandy, MD;  Location: [place];  Service: Vascular;  Laterality: Right;  . INSERTION OF DIALYSIS CATHETER Right 02/04/2017   Procedure: INSERTION OF DIALYSIS CATHETER;  Surgeon: Waynetta Sandy, MD;  Location: [place];  Service: Vascular;  Laterality: Right;  . KIDNEY TRANSPLANT  1996  . PERIPHERAL VASCULAR CATHETERIZATION Left 10/23/2016   Procedure: Fistulagram;  Surgeon: Elam Dutch, MD;  Location: [place] [facility];  Service: Cardiovascular;  Laterality: Left;  . PERIPHERAL VASCULAR THROMBECTOMY Right 04/16/2020   Procedure: PERIPHERAL VASCULAR THROMBECTOMY;  Surgeon: Katha Cabal, MD;  Location: [place] [facility];  Service: Cardiovascular;  Laterality: Right;  . RESECTION OF ARTERIOVENOUS FISTULA ANEURYSM Left 11/22/2015   Procedure: RESECTION OF LEFT RADIOCEPHALIC FISTULA ANEURYSM ;  Surgeon: Serafina Mitchell, MD;  Location: [place];  Service: Vascular;  Laterality: Left;  . REVISON OF ARTERIOVENOUS FISTULA Left 12/22/2016   Procedure: REVISON OF LEFT ARTERIOVENOUS FISTULA;  Surgeon: Waynetta Sandy, MD;  Location: [place];  Service: Vascular;  Laterality: Left;  . REVISON OF ARTERIOVENOUS FISTULA Left 02/04/2017   Procedure: REVISON OF LEFT UPPER ARM ARTERIOVENOUS FISTULA;  Surgeon: Waynetta Sandy, MD;  Location: [place];  Service: Vascular;  Laterality: Left;  . UPPER EXTREMITY ANGIOGRAPHY Bilateral 09/19/2019   Procedure: UPPER EXTREMITY ANGIOGRAPHY;  Surgeon: Katha Cabal, MD;  Location: [place] [facility];  Service: Cardiovascular;  Laterality: Bilateral;   Family History Family  History  Problem Relation Age of Onset  . Colon cancer Brother   . Cancer Brother   . Coronary artery disease Mother 6  . Hyperlipidemia Mother   . Hypertension Mother   . Stroke Maternal Aunt   . Esophageal cancer Neg Hx   . Stomach cancer Neg Hx   . Rectal cancer Neg Hx     Social History Social History   Tobacco Use  . Smoking status: Former Smoker    Types: Cigarettes    Quit date: 12/31/1991    Years since quitting: 29.3  . Smokeless tobacco: Never Used  Vaping Use  . Vaping Use: Never used  Substance Use Topics  . Alcohol use: No  Alcohol/week: 0.0 standard drinks  . Drug use: No   Allergies Sulfa antibiotics and Adhesive [tape]  Review of Systems Review of Systems All other systems are reviewed and are negative for acute change except as noted in the HPI  Physical Exam Vital Signs  I have reviewed the triage vital signs BP (!) 132/48   Pulse 75   Temp 97.7 F (36.5 C) (Oral)   Resp 18   SpO2 96%   Physical Exam Vitals reviewed.  Constitutional:      General: She is not in acute distress.    Appearance: She is well-developed. She is not diaphoretic.  HENT:     Head: Normocephalic and atraumatic.     Nose: Nose normal.  Eyes:     General: No scleral icterus.       Right eye: No discharge.        Left eye: No discharge.     Conjunctiva/sclera: Conjunctivae normal.     Pupils: Pupils are equal, round, and reactive to light.  Cardiovascular:     Rate and Rhythm: Normal rate and regular rhythm.     Heart sounds: No murmur heard. No friction rub. No gallop.      Arteriovenous access: right and left arteriovenous access is present. Pulmonary:     Effort: Pulmonary effort is normal. No respiratory distress.     Breath sounds: Normal breath sounds. No stridor. No rales.  Abdominal:     General: There is no distension.     Palpations: Abdomen is soft.     Tenderness: There is no abdominal tenderness.  Musculoskeletal:        General: No  tenderness.     Cervical back: Normal range of motion and neck supple.  Skin:    General: Skin is warm and dry.     Findings: No erythema or rash.  Neurological:     Mental Status: She is alert and oriented to person, place, and time.     ED Results and Treatments Labs (all labs ordered are listed, but only abnormal results are displayed) Labs Reviewed  CBC - Abnormal; Notable for the following components:      Result Value   RBC 2.98 (*)    Hemoglobin 9.2 (*)    HCT 28.5 (*)    RDW 17.0 (*)    All other components within normal limits  BASIC METABOLIC PANEL - Abnormal; Notable for the following components:   Chloride 95 (*)    BUN 24 (*)    Creatinine, Ser 4.19 (*)    GFR, Estimated 11 (*)    All other components within normal limits                                                                                                                         EKG  EKG Interpretation  Date/Time:    Ventricular Rate:    PR Interval:    QRS Duration:   QT Interval:  QTC Calculation:   R Axis:     Text Interpretation:        Radiology DG Chest 2 View  Result Date: 04/26/2021 CLINICAL DATA:  Shortness of breath EXAM: CHEST - 2 VIEW COMPARISON:  04/06/2021 FINDINGS: Cardiomegaly and hilar vascular fullness. Redemonstrated generalized interstitial prominence and hyperinflation. Aortic atherosclerosis and tortuosity. IMPRESSION: 1. Stable compared to prior. 2. Cardiomegaly and vascular congestion. 3. Hyperinflation. Electronically Signed   By: Monte Fantasia M.D.   On: 04/26/2021 04:58    Pertinent labs & imaging results that were available during my care of the patient were reviewed by me and considered in my medical decision making (see chart for details).  Medications Ordered in ED Medications  albuterol (VENTOLIN HFA) 108 (90 Base) MCG/ACT inhaler 2 puff (2 puffs Inhalation Given 04/26/21 0628)  ondansetron (ZOFRAN-ODT) disintegrating tablet 4 mg (has no administration in  time range)                                                                                                                                    Procedures Procedures  (including critical care time)  Medical Decision Making / ED Course I have reviewed the nursing notes for this encounter and the patient's prior records (if available in EHR or on provided paperwork).   BRYTTANI BLEW was evaluated in Emergency Department on 04/26/2021 for the symptoms described in the history of present illness. She was evaluated in the context of the global COVID-19 pandemic, which necessitated consideration that the patient might be at risk for infection with the SARS-CoV-2 virus that causes COVID-19. Institutional protocols and algorithms that pertain to the evaluation of patients at risk for COVID-19 are in a state of rapid change based on information released by regulatory bodies including the CDC and federal and state organizations. These policies and algorithms were followed during the patient's care in the ED.  Patient presented with shortness of breath after a coughing spell.  Now resolved.  She is asymptomatic. No respiratory distress.  Lungs clear to auscultation bilaterally. Good air movement throughout.  Initial blood pressure with systolics in the 73Z likely superfluous and related to cuff position.  I readjusted the cuff and patient's blood pressures remained with systolics in the 329J or above.  She was monitored for several hours and remained hemodynamically stable.  Labs grossly reassuring without leukocytosis.  Stable hemoglobin.  No significant electrolyte derangements.  Known ESRD.  Chest x-ray without evidence of pneumonia.  They show some vascular congestion without overt pulmonary edema.        Final Clinical Impression(s) / ED Diagnoses Final diagnoses:  Cough  Shortness of breath    The patient appears reasonably screened and/or stabilized for discharge and I doubt any  other medical condition or other [facility] requiring further screening, evaluation, or treatment in the ED at this time prior to discharge. Safe for discharge with strict return precautions.  Disposition:  Discharge  Condition: Good  I have discussed the results, Dx and Tx plan with the patient/family who expressed understanding and agree(s) with the plan. Discharge instructions discussed at length. The patient/family was given strict return precautions who verbalized understanding of the instructions. No further questions at time of discharge.    ED Discharge Orders    None      Follow Up: [name], MD [address] [place] [address] [phone]  Call  to schedule an appointment for close follow up     This chart was dictated using voice recognition software.  Despite best efforts to proofread,  errors can occur which can change the documentation meaning.   Fatima Blank, MD 04/26/21 [phone]

## 2021-04-28 DIAGNOSIS — Z992 Dependence on renal dialysis: Secondary | ICD-10-CM | POA: Diagnosis not present

## 2021-04-28 DIAGNOSIS — N2581 Secondary hyperparathyroidism of renal origin: Secondary | ICD-10-CM | POA: Diagnosis not present

## 2021-04-28 DIAGNOSIS — N186 End stage renal disease: Secondary | ICD-10-CM | POA: Diagnosis not present

## 2021-04-29 DIAGNOSIS — M109 Gout, unspecified: Secondary | ICD-10-CM | POA: Diagnosis not present

## 2021-04-29 DIAGNOSIS — I214 Non-ST elevation (NSTEMI) myocardial infarction: Secondary | ICD-10-CM | POA: Diagnosis not present

## 2021-04-29 DIAGNOSIS — I5032 Chronic diastolic (congestive) heart failure: Secondary | ICD-10-CM | POA: Diagnosis not present

## 2021-04-29 DIAGNOSIS — I2510 Atherosclerotic heart disease of native coronary artery without angina pectoris: Secondary | ICD-10-CM | POA: Diagnosis not present

## 2021-04-29 DIAGNOSIS — I472 Ventricular tachycardia: Secondary | ICD-10-CM | POA: Diagnosis not present

## 2021-04-29 DIAGNOSIS — D559 Anemia due to enzyme disorder, unspecified: Secondary | ICD-10-CM | POA: Diagnosis not present

## 2021-04-29 DIAGNOSIS — I132 Hypertensive heart and chronic kidney disease with heart failure and with stage 5 chronic kidney disease, or end stage renal disease: Secondary | ICD-10-CM | POA: Diagnosis not present

## 2021-04-29 DIAGNOSIS — I4891 Unspecified atrial fibrillation: Secondary | ICD-10-CM | POA: Diagnosis not present

## 2021-04-29 DIAGNOSIS — N186 End stage renal disease: Secondary | ICD-10-CM | POA: Diagnosis not present

## 2021-04-30 DIAGNOSIS — N2581 Secondary hyperparathyroidism of renal origin: Secondary | ICD-10-CM | POA: Diagnosis not present

## 2021-04-30 DIAGNOSIS — Z992 Dependence on renal dialysis: Secondary | ICD-10-CM | POA: Diagnosis not present

## 2021-04-30 DIAGNOSIS — N186 End stage renal disease: Secondary | ICD-10-CM | POA: Diagnosis not present

## 2021-05-02 DIAGNOSIS — N186 End stage renal disease: Secondary | ICD-10-CM | POA: Diagnosis not present

## 2021-05-02 DIAGNOSIS — Z992 Dependence on renal dialysis: Secondary | ICD-10-CM | POA: Diagnosis not present

## 2021-05-02 DIAGNOSIS — N2581 Secondary hyperparathyroidism of renal origin: Secondary | ICD-10-CM | POA: Diagnosis not present

## 2021-05-05 DIAGNOSIS — N2581 Secondary hyperparathyroidism of renal origin: Secondary | ICD-10-CM | POA: Diagnosis not present

## 2021-05-05 DIAGNOSIS — Z992 Dependence on renal dialysis: Secondary | ICD-10-CM | POA: Diagnosis not present

## 2021-05-05 DIAGNOSIS — N186 End stage renal disease: Secondary | ICD-10-CM | POA: Diagnosis not present

## 2021-05-07 DIAGNOSIS — N2581 Secondary hyperparathyroidism of renal origin: Secondary | ICD-10-CM | POA: Diagnosis not present

## 2021-05-07 DIAGNOSIS — Z992 Dependence on renal dialysis: Secondary | ICD-10-CM | POA: Diagnosis not present

## 2021-05-07 DIAGNOSIS — N186 End stage renal disease: Secondary | ICD-10-CM | POA: Diagnosis not present

## 2021-05-09 DIAGNOSIS — Z992 Dependence on renal dialysis: Secondary | ICD-10-CM | POA: Diagnosis not present

## 2021-05-09 DIAGNOSIS — N186 End stage renal disease: Secondary | ICD-10-CM | POA: Diagnosis not present

## 2021-05-09 DIAGNOSIS — N2581 Secondary hyperparathyroidism of renal origin: Secondary | ICD-10-CM | POA: Diagnosis not present

## 2021-05-12 DIAGNOSIS — N186 End stage renal disease: Secondary | ICD-10-CM | POA: Diagnosis not present

## 2021-05-12 DIAGNOSIS — N2581 Secondary hyperparathyroidism of renal origin: Secondary | ICD-10-CM | POA: Diagnosis not present

## 2021-05-12 DIAGNOSIS — Z992 Dependence on renal dialysis: Secondary | ICD-10-CM | POA: Diagnosis not present

## 2021-05-13 DIAGNOSIS — Z992 Dependence on renal dialysis: Secondary | ICD-10-CM | POA: Diagnosis not present

## 2021-05-13 DIAGNOSIS — N186 End stage renal disease: Secondary | ICD-10-CM | POA: Diagnosis not present

## 2021-05-13 DIAGNOSIS — T8612 Kidney transplant failure: Secondary | ICD-10-CM | POA: Diagnosis not present

## 2021-05-14 DIAGNOSIS — N186 End stage renal disease: Secondary | ICD-10-CM | POA: Diagnosis not present

## 2021-05-14 DIAGNOSIS — N2581 Secondary hyperparathyroidism of renal origin: Secondary | ICD-10-CM | POA: Diagnosis not present

## 2021-05-14 DIAGNOSIS — Z992 Dependence on renal dialysis: Secondary | ICD-10-CM | POA: Diagnosis not present

## 2021-05-16 DIAGNOSIS — N2581 Secondary hyperparathyroidism of renal origin: Secondary | ICD-10-CM | POA: Diagnosis not present

## 2021-05-16 DIAGNOSIS — N186 End stage renal disease: Secondary | ICD-10-CM | POA: Diagnosis not present

## 2021-05-16 DIAGNOSIS — Z992 Dependence on renal dialysis: Secondary | ICD-10-CM | POA: Diagnosis not present

## 2021-05-16 IMAGING — US US ABDOMEN LIMITED
1 series · 13 of 13 positions shown · non-contrast
Comparison: CT abdomen and pelvis July 21, 2020

CLINICAL DATA: Upper abdominal pain with nausea and vomiting

EXAM:
ULTRASOUND ABDOMEN LIMITED RIGHT UPPER QUADRANT

[Series 1: us abdomen limited · 13 of 13 slices shown]
[im 1/13]
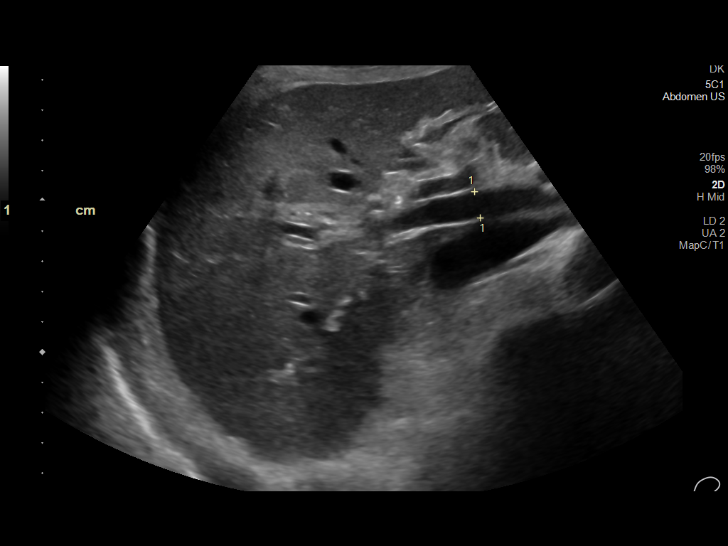
[im 2/13]
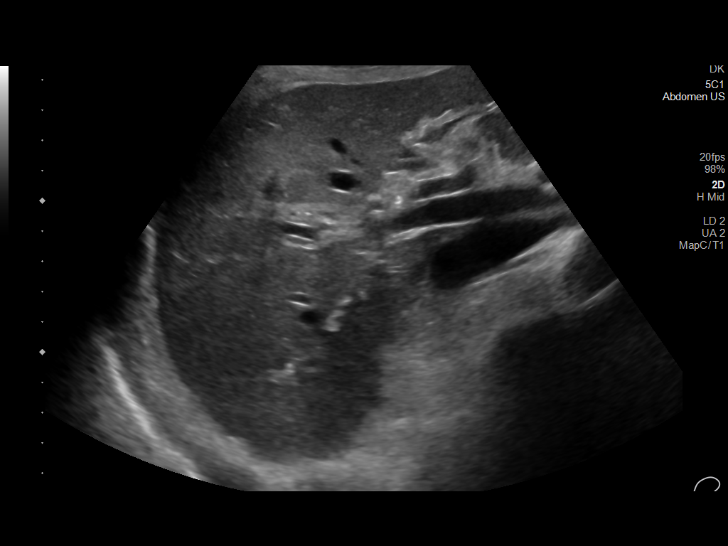
[im 3/13]
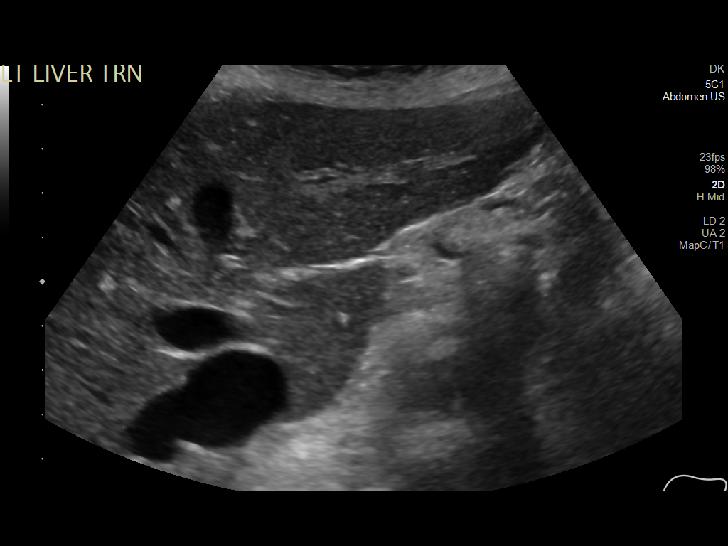
[im 4/13]
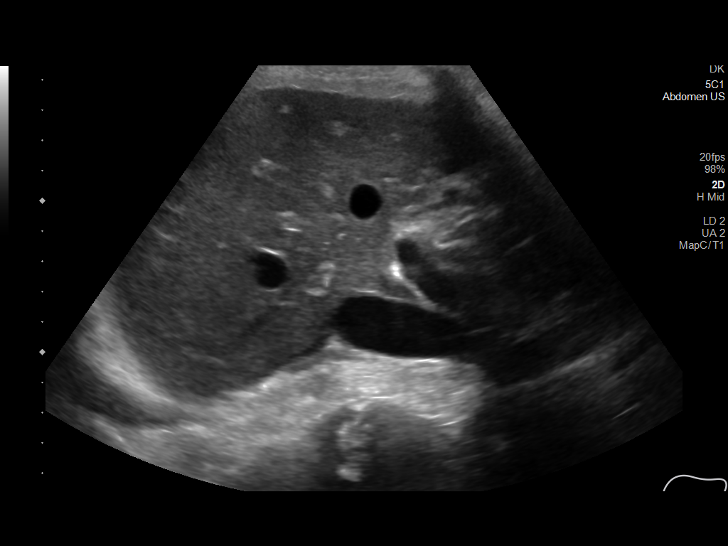
[im 5/13]
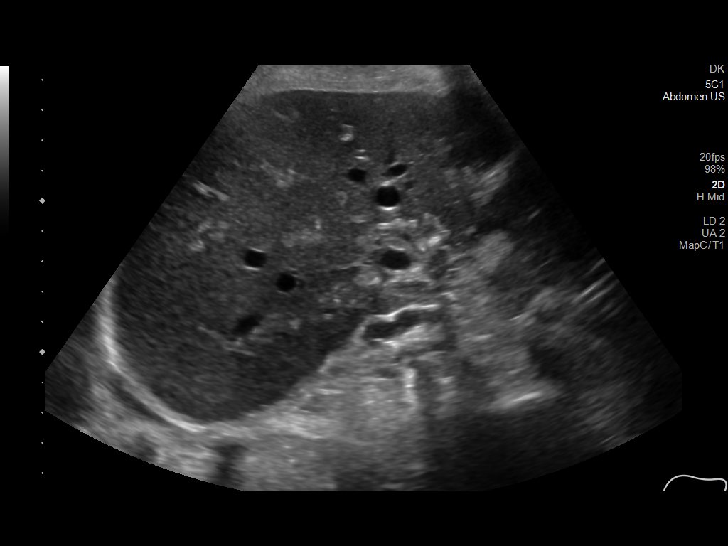
[im 6/13]
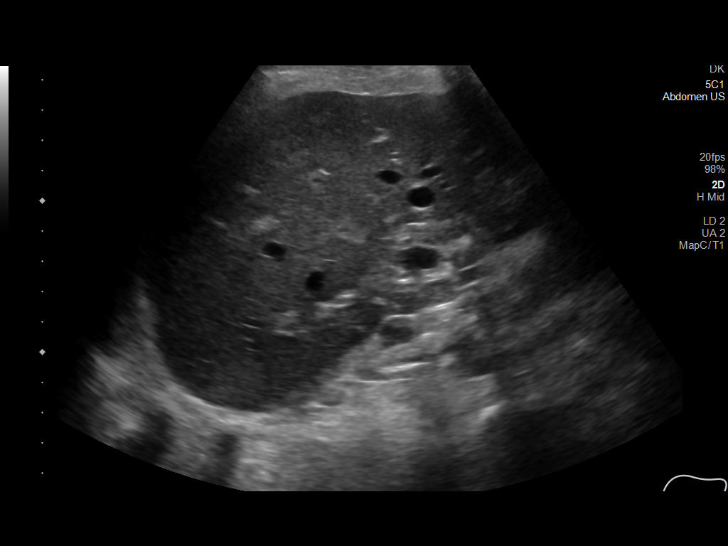
[im 7/13]
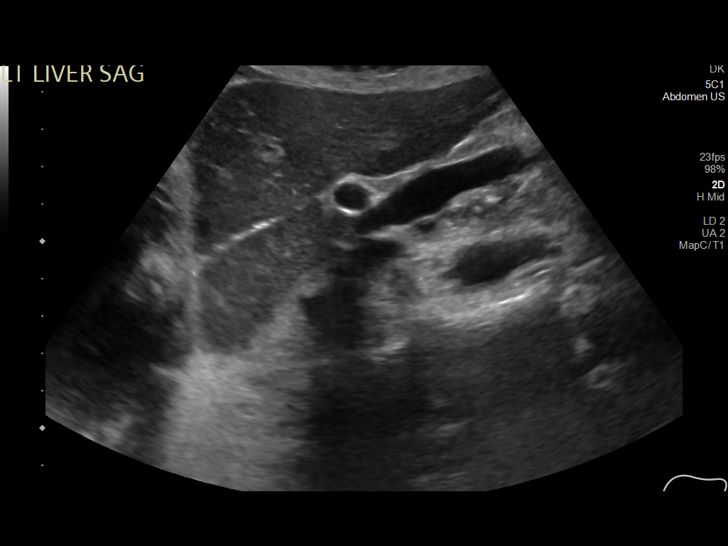
[im 8/13]
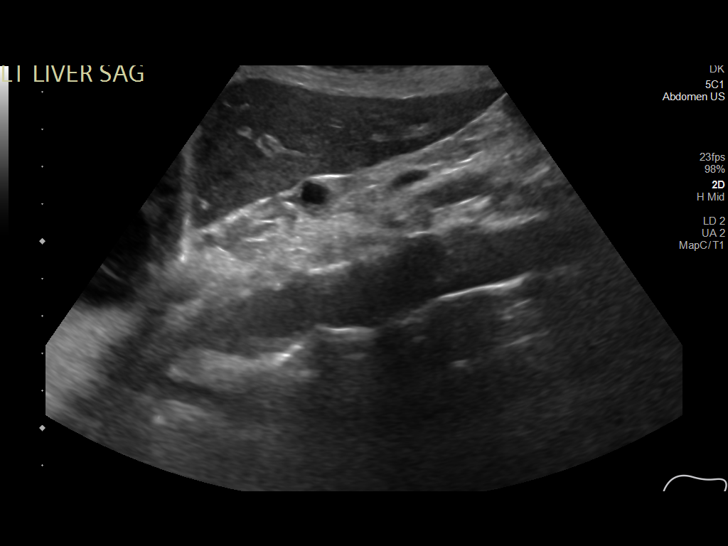
[im 9/13]
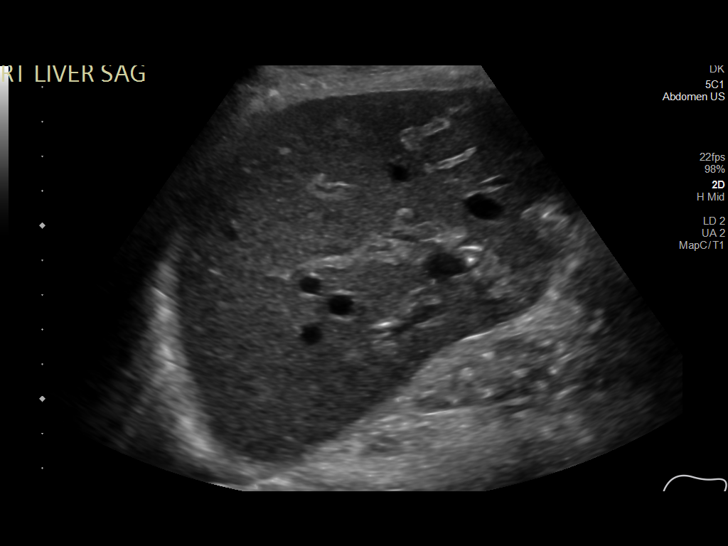
[im 10/13]
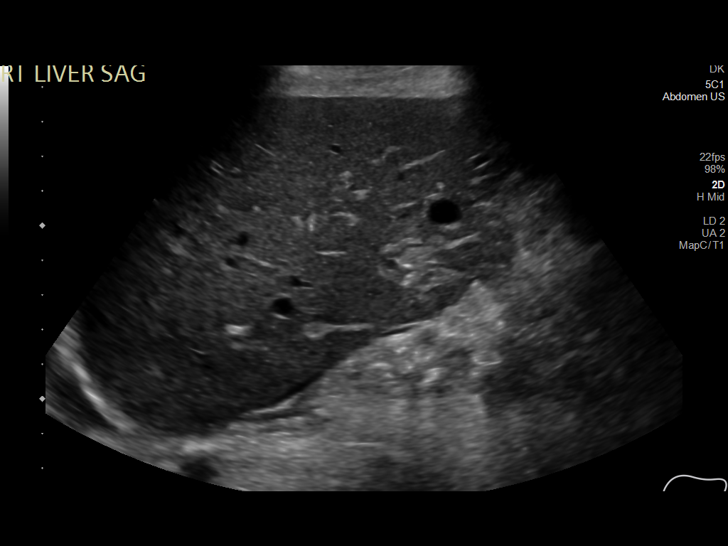
[im 11/13]
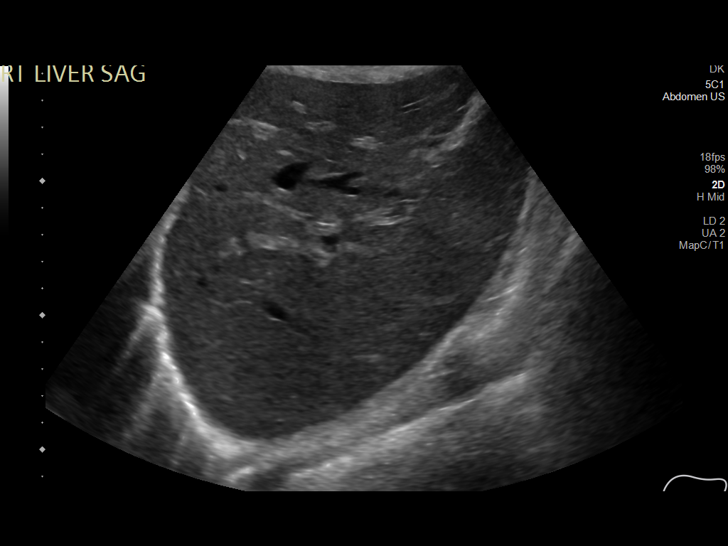
[im 12/13]
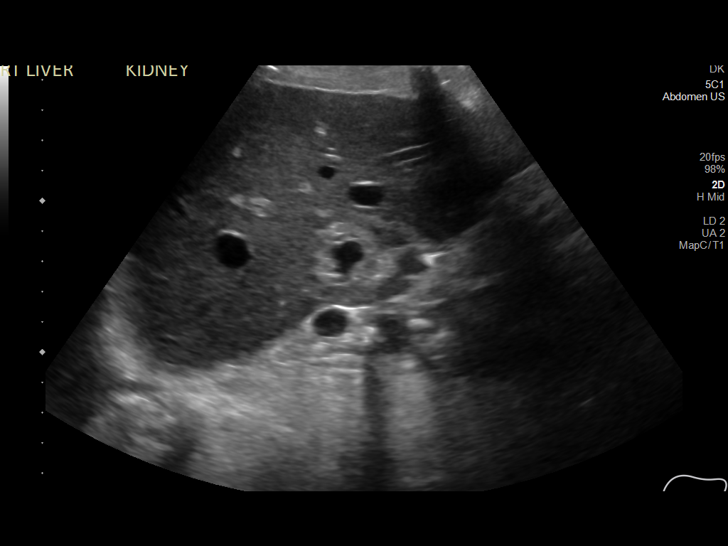
[im 13/13]
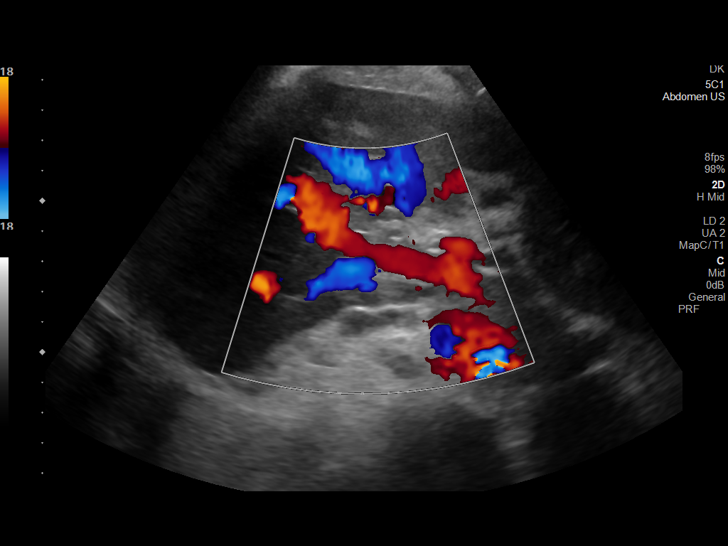

[13 of 13 positions shown; findings below may reference images not displayed]

FINDINGS: Gallbladder:

Surgically absent.

Common bile duct:

Diameter: 9 mm which may be within normal limits for post
cholecystectomy state. There is a degree of intrahepatic biliary
duct dilatation.

Liver:

No focal lesion identified. Within normal limits in parenchymal
echogenicity. Portal vein is patent on color Doppler imaging with
normal direction of blood flow towards the liver.

Other: None.
IMPRESSION: There is biliary duct dilatation which potentially may be due to
post cholecystectomy state. Gallbladder is absent. No focal liver
lesions evident.

## 2021-05-18 ENCOUNTER — Encounter (HOSPITAL_COMMUNITY): Payer: Self-pay | Admitting: Emergency Medicine

## 2021-05-18 ENCOUNTER — Emergency Department (HOSPITAL_COMMUNITY): Payer: Medicare HMO

## 2021-05-18 ENCOUNTER — Other Ambulatory Visit: Payer: Self-pay

## 2021-05-18 ENCOUNTER — Emergency Department (HOSPITAL_COMMUNITY)
Admission: EM | Admit: 2021-05-18 | Discharge: 2021-05-18 | Disposition: A | Discharge status: Home / Self Care | Payer: Medicare HMO | Attending: Emergency Medicine | Admitting: Emergency Medicine

## 2021-05-18 DIAGNOSIS — Z87891 Personal history of nicotine dependence: Secondary | ICD-10-CM | POA: Insufficient documentation

## 2021-05-18 DIAGNOSIS — N186 End stage renal disease: Secondary | ICD-10-CM | POA: Insufficient documentation

## 2021-05-18 DIAGNOSIS — Z992 Dependence on renal dialysis: Secondary | ICD-10-CM | POA: Diagnosis not present

## 2021-05-18 DIAGNOSIS — I517 Cardiomegaly: Secondary | ICD-10-CM | POA: Diagnosis not present

## 2021-05-18 DIAGNOSIS — Z79899 Other long term (current) drug therapy: Secondary | ICD-10-CM | POA: Diagnosis not present

## 2021-05-18 DIAGNOSIS — Z20822 Contact with and (suspected) exposure to covid-19: Secondary | ICD-10-CM | POA: Insufficient documentation

## 2021-05-18 DIAGNOSIS — I5033 Acute on chronic diastolic (congestive) heart failure: Secondary | ICD-10-CM | POA: Diagnosis not present

## 2021-05-18 DIAGNOSIS — Z7982 Long term (current) use of aspirin: Secondary | ICD-10-CM | POA: Insufficient documentation

## 2021-05-18 DIAGNOSIS — R0902 Hypoxemia: Secondary | ICD-10-CM | POA: Diagnosis not present

## 2021-05-18 DIAGNOSIS — I959 Hypotension, unspecified: Secondary | ICD-10-CM | POA: Diagnosis not present

## 2021-05-18 DIAGNOSIS — I132 Hypertensive heart and chronic kidney disease with heart failure and with stage 5 chronic kidney disease, or end stage renal disease: Secondary | ICD-10-CM | POA: Diagnosis not present

## 2021-05-18 DIAGNOSIS — I2510 Atherosclerotic heart disease of native coronary artery without angina pectoris: Secondary | ICD-10-CM | POA: Diagnosis not present

## 2021-05-18 DIAGNOSIS — J069 Acute upper respiratory infection, unspecified: Secondary | ICD-10-CM | POA: Diagnosis not present

## 2021-05-18 DIAGNOSIS — R0602 Shortness of breath: Secondary | ICD-10-CM | POA: Diagnosis not present

## 2021-05-18 DIAGNOSIS — R059 Cough, unspecified: Secondary | ICD-10-CM | POA: Diagnosis not present

## 2021-05-18 DIAGNOSIS — I10 Essential (primary) hypertension: Secondary | ICD-10-CM | POA: Diagnosis not present

## 2021-05-18 DIAGNOSIS — B9789 Other viral agents as the cause of diseases classified elsewhere: Secondary | ICD-10-CM | POA: Diagnosis not present

## 2021-05-18 LAB — COMPREHENSIVE METABOLIC PANEL
ALT: 10 U/L (ref 0–44)
AST: 20 U/L (ref 15–41)
Albumin: 3 g/dL — ABNORMAL LOW (ref 3.5–5.0)
Alkaline Phosphatase: 117 U/L (ref 38–126)
Anion gap: 13 (ref 5–15)
BUN: 39 mg/dL — ABNORMAL HIGH (ref 8–23)
CO2: 29 mmol/L (ref 22–32)
Calcium: 9.1 mg/dL (ref 8.9–10.3)
Chloride: 94 mmol/L — ABNORMAL LOW (ref 98–111)
Creatinine, Ser: 6.88 mg/dL — ABNORMAL HIGH (ref 0.44–1.00)
GFR, Estimated: 6 mL/min — ABNORMAL LOW (ref >60)
Glucose, Bld: 87 mg/dL (ref 70–99)
Potassium: 3.9 mmol/L (ref 3.5–5.1)
Sodium: 136 mmol/L (ref 135–145)
Total Bilirubin: 0.7 mg/dL (ref 0.3–1.2)
Total Protein: 6.7 g/dL (ref 6.5–8.1)

## 2021-05-18 LAB — CBC WITH DIFFERENTIAL/PLATELET
Abs Immature Granulocytes: 0.02 10*3/uL (ref 0.00–0.07)
Basophils Absolute: 0 10*3/uL (ref 0.0–0.1)
Basophils Relative: 1 %
Eosinophils Absolute: 0.3 10*3/uL (ref 0.0–0.5)
Eosinophils Relative: 5 %
HCT: 27.8 % — ABNORMAL LOW (ref 36.0–46.0)
Hemoglobin: 8.9 g/dL — ABNORMAL LOW (ref 12.0–15.0)
Immature Granulocytes: 0 %
Lymphocytes Relative: 23 %
Lymphs Abs: 1.2 10*3/uL (ref 0.7–4.0)
MCH: 30.7 pg (ref 26.0–34.0)
MCHC: 32 g/dL (ref 30.0–36.0)
MCV: 95.9 fL (ref 80.0–100.0)
Monocytes Absolute: 0.7 10*3/uL (ref 0.1–1.0)
Monocytes Relative: 15 %
Neutro Abs: 2.9 10*3/uL (ref 1.7–7.7)
Neutrophils Relative %: 56 %
Platelets: 217 10*3/uL (ref 150–400)
RBC: 2.9 MIL/uL — ABNORMAL LOW (ref 3.87–5.11)
RDW: 16.6 % — ABNORMAL HIGH (ref 11.5–15.5)
WBC: 5.1 10*3/uL (ref 4.0–10.5)
nRBC: 0 % (ref 0.0–0.2)

## 2021-05-18 LAB — RESP PANEL BY RT-PCR (FLU A&B, COVID) ARPGX2
Influenza A by PCR: NEGATIVE
Influenza B by PCR: NEGATIVE
SARS Coronavirus 2 by RT PCR: NEGATIVE

## 2021-05-18 LAB — TROPONIN I (HIGH SENSITIVITY)
Troponin I (High Sensitivity): 169 ng/L (ref <18)
Troponin I (High Sensitivity): 173 ng/L (ref <18)

## 2021-05-18 LAB — BRAIN NATRIURETIC PEPTIDE: B Natriuretic Peptide: 2937.5 pg/mL — ABNORMAL HIGH (ref 0.0–100.0)

## 2021-05-18 MED ORDER — BENZONATATE 100 MG PO CAPS: 100.0000 mg | ORAL_CAPSULE | Freq: Three times a day (TID) | ORAL | 0 refills | Status: DC

## 2021-05-18 MED ORDER — DOXYCYCLINE HYCLATE 100 MG PO CAPS
100.0000 mg | ORAL_CAPSULE | Freq: Two times a day (BID) | ORAL | 0 refills | Status: DC
Start: 2021-05-18 — End: 2021-08-06

## 2021-05-18 NOTE — ED Provider Notes (Signed)
Emergency Medicine Provider Triage Evaluation Note  [name] , a 70 y.o. female  was evaluated in triage.  Pt complains of ESRD (M W,F).  No missed treatments.  1 week of cough productive of clear sputum.  No treatments for this PTA.  No fever, chills or known sick contacts.   Review of Systems  Positive: Cough, SOB Negative: Fever, chills, nausea, vomiting  Physical Exam  BP 101/69 (BP Location: Left Arm)   Pulse 84   Temp 98.4 F (36.9 C)   Resp 16   SpO2 98%  Gen:   Awake, no distress   Resp:  Increased effort  MSK:   Moves extremities without difficulty  Other:  Wet cough; rales throughout on exam  Medical Decision Making  Medically screening exam initiated at 6:35 AM.  Appropriate orders placed.  [name] was informed that the remainder of the evaluation will be completed by another provider, this initial triage assessment does not replace that evaluation, and the importance of remaining in the ED until their evaluation is complete.  Cough and SOB concerning for fluid overload or CHF.  Work-up initiated.   [name], [name] 05/18/21 0321    Ripley Fraise, MD 05/19/21 [phone]

## 2021-05-18 NOTE — Discharge Instructions (Signed)
All the results in the ER are normal, labs and imaging.  We suspect that you might have a mild infection. Take the medications prescribed. Other possibility is that your cough is from LISINOPRIL, your BP medicine. Your pcp can discontinue it.   The workup in the ER is not complete, and is limited to screening for life threatening and emergent conditions only, so please see a primary care doctor for further evaluation.

## 2021-05-18 NOTE — ED Notes (Signed)
Pt provided w/ sprite and tolerated well

## 2021-05-18 NOTE — ED Notes (Signed)
Attempted to obtain 2nd trop. Unable to obtain sample. Will notify phlebotomy

## 2021-05-18 NOTE — ED Provider Notes (Addendum)
[facility] EMERGENCY DEPARTMENT Provider Note   CSN: [id number] Arrival date & time: 05/18/21  6834     History Chief Complaint  Patient presents with  . Cough    [name] is a 70 y.o. female.  HPI     70 year old comes in with chief complaint of cough. Pt has hx of CHF, CAD, ESRD on HD. She reports cough x 1 week.  Patient is having new phlegm, it is thick but clear.  She has no associated shortness of breath, chest pain.  Patient denies any orthopnea, PND-like symptoms.  She is up-to-date with her vaccination and her dialysis.    Past Medical History:  Diagnosis Date  . Adenomatous polyp of colon 10/2010, 2006, 2015  . Anemia in CKD (chronic kidney disease) 11/07/2012   s/p blood transfusion.   . Arthritis   . CAD (coronary artery disease)    "something like that"  . CHF (congestive heart failure) ([employer] [facility])   . Constipation   . Depression with anxiety   . Diverticula, colon   . 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  Had some early rejection then stable function for years, then had slow decline of function and went back on hemodialysis in 2012.  Gets HD TTS schedule at [facility] on [facility] still using L forearm AVF.     Marland Kitchen GERD (gastroesophageal reflux disease)   . GI bleed 2017   felt to be ischemic colitis, last colo 2015  . Headache   . Hyperlipidemia   . Hypertension   . Neurologic gait dysfunction   . Neuromuscular disorder (HCC)    neuropathy hand and legs  . Osteoporosis   . Pneumonia   . 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 Pulmonary edema 12/2019  . Stroke [facility]) 11/2015   TIA  . Weight loss, unintentional     Patient Active Problem List   Diagnosis Date Noted  . Major depressive disorder, recurrent episode, moderate ([place]) 02/23/2021  . TIA (transient ischemic attack) 02/21/2021  . Prolonged QT interval  02/21/2021  . NSTEMI (non-ST elevated myocardial infarction) ([place]) 05/14/2020  . Volume overload 05/13/2020  . Hypotension after procedure 03/16/2020  . Chest pain 03/16/2020  . Change in mental state 03/15/2020  . Ventricular tachycardia ([place]) 03/15/2020  . Seizure ([place]) 03/15/2020  . Unresponsive episode 03/15/2020  . Pulmonary edema 01/01/2020  . Complication of vascular access for dialysis 12/21/2019  . Breakdown (mechanical) of surgically created arteriovenous fistula, initial encounter ([place]) 12/18/2019  . Weakness 10/11/2019  . 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 Dependence on renal dialysis ([place]) 07/24/2019  . Iron deficiency anemia, unspecified 06/09/2019  . Age-related osteoporosis without current pathological fracture 04/17/2019  . 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 Coagulation defect, unspecified ([place]) 04/17/2019  . Diarrhea, unspecified 04/17/2019  . Encounter for screening for depression 04/17/2019  . Headache, unspecified 04/17/2019  . 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 Pain, unspecified 04/17/2019  . Polyp of colon 04/17/2019  . Primary generalized (osteo)arthritis 04/17/2019  . Pruritus, unspecified 04/17/2019  . Pure hypercholesterolemia, unspecified 04/17/2019  . Secondary hyperparathyroidism of renal origin ([place]) 04/17/2019  . Gastro-esophageal reflux disease without esophagitis 04/17/2019  . Essential (primary) hypertension 04/17/2019  . Hyperlipidemia, unspecified 04/17/2019  . Transient cerebral ischemic attack, unspecified 04/17/2019  . Hypoxemia 12/13/2018  . Atrial fibrillation (  [place]) 03/23/2017  . Prolonged Q-T interval on ECG 02/24/2017  . Hypokalemia 02/24/2017  . Acute ischemic stroke ([place]) 02/23/2017  . Malnutrition of moderate degree 12/24/2016  . Problem with dialysis access ([place]) 12/21/2016  . Acute ischemic colitis ([place]) 05/16/2016  . Colitis 05/15/2016  . Rectal bleeding 05/15/2016  .  Neurologic abnormality 11/19/2015  . Hyperkalemia 11/19/2015  . Anxiety 11/19/2015  . Insomnia 11/19/2015  . Gait instability   . Stroke-like symptom 11/16/2015  . Vestibular neuritis 11/16/2015  . Stroke (cerebrum) ([place]) 11/16/2015  . Dizziness 05/09/2015  . Ataxia 05/09/2015  . H/O: CVA (cerebrovascular accident) 05/09/2015  . Left facial numbness 05/09/2015  . Left leg numbness 05/09/2015  . Hyperlipidemia   . Acute on chronic diastolic heart failure ([place])   . Shortness of breath 04/01/2013  . HTN (hypertension) 04/01/2013  . Cholecystitis, acute 11/07/2012  . End-stage renal disease on hemodialysis ([place]) 11/07/2012  . Anemia in chronic kidney disease 11/07/2012    Past Surgical History:  Procedure Laterality Date  . AV FISTULA PLACEMENT     for dialysis  . AV FISTULA PLACEMENT Left 11/22/2015   Procedure: ARTERIOVENOUS (AV) FISTULA CREATION-LEFT BRACHIOCEPHALIC;  Surgeon: Serafina Mitchell, MD;  Location: [place];  Service: Vascular;  Laterality: Left;  . AV FISTULA PLACEMENT Right 03/15/2020   Procedure: INSERTION OF ARTERIOVENOUS (AV) GORE-TEX GRAFT ARM ( BRACHIAL AXILLARY );  Surgeon: Katha Cabal, MD;  Location: [facility];  Service: Vascular;  Laterality: Right;  . BACK SURGERY    . CERVICAL FUSION    . CHOLECYSTECTOMY  12/02/2012   Procedure: LAPAROSCOPIC CHOLECYSTECTOMY WITH INTRAOPERATIVE CHOLANGIOGRAM;  Surgeon: Edward Jolly, MD;  Location: [place];  Service: General;  Laterality: N/A;  . EYE SURGERY Bilateral    cataract surgery  . EYE SURGERY Left 2019   laser  . HEMATOMA EVACUATION Left 12/24/2016   Procedure: EVACUATION HEMATOMA LEFT UPPER ARM;  Surgeon: Waynetta Sandy, MD;  Location: [place];  Service: Vascular;  Laterality: Left;  . I & D EXTREMITY Left 12/31/2016   Procedure: IRRIGATION AND DEBRIDEMENT EXTREMITY;  Surgeon: Angelia Mould, MD;  Location: [place];  Service: Vascular;  Laterality: Left;  . INSERTION OF DIALYSIS CATHETER Right  12/24/2016   Procedure: INSERTION OF DIALYSIS CATHETER;  Surgeon: Waynetta Sandy, MD;  Location: [place];  Service: Vascular;  Laterality: Right;  . INSERTION OF DIALYSIS CATHETER Right 02/04/2017   Procedure: INSERTION OF DIALYSIS CATHETER;  Surgeon: Waynetta Sandy, MD;  Location: [place];  Service: Vascular;  Laterality: Right;  . KIDNEY TRANSPLANT  1996  . PERIPHERAL VASCULAR CATHETERIZATION Left 10/23/2016   Procedure: Fistulagram;  Surgeon: Elam Dutch, MD;  Location: [place] [facility];  Service: Cardiovascular;  Laterality: Left;  . PERIPHERAL VASCULAR THROMBECTOMY Right 04/16/2020   Procedure: PERIPHERAL VASCULAR THROMBECTOMY;  Surgeon: Katha Cabal, MD;  Location: [place] CV LAB;  Service: Cardiovascular;  Laterality: Right;  . RESECTION OF ARTERIOVENOUS FISTULA ANEURYSM Left 11/22/2015   Procedure: RESECTION OF LEFT RADIOCEPHALIC FISTULA ANEURYSM ;  Surgeon: Serafina Mitchell, MD;  Location: [place];  Service: Vascular;  Laterality: Left;  . REVISON OF ARTERIOVENOUS FISTULA Left 12/22/2016   Procedure: REVISON OF LEFT ARTERIOVENOUS FISTULA;  Surgeon: Waynetta Sandy, MD;  Location: [place];  Service: Vascular;  Laterality: Left;  . REVISON OF ARTERIOVENOUS FISTULA Left 02/04/2017   Procedure: REVISON OF LEFT UPPER ARM ARTERIOVENOUS FISTULA;  Surgeon: Waynetta Sandy, MD;  Location: [place];  Service: Vascular;  Laterality: Left;  . UPPER EXTREMITY ANGIOGRAPHY Bilateral 09/19/2019   Procedure: UPPER EXTREMITY ANGIOGRAPHY;  Surgeon: Katha Cabal, MD;  Location: [place] CV LAB;  Service: Cardiovascular;  Laterality: Bilateral;     OB History   No obstetric history on file.     Family History  Problem Relation Age of Onset  . Colon cancer Brother   . Cancer Brother   . Coronary artery disease Mother 17  . Hyperlipidemia Mother   . Hypertension Mother   . Stroke Maternal Aunt   . Esophageal cancer Neg Hx   . Stomach cancer Neg Hx    . Rectal cancer Neg Hx     Social History   Tobacco Use  . Smoking status: Former Smoker    Types: Cigarettes    Quit date: 12/31/1991    Years since quitting: 29.4  . Smokeless tobacco: Never Used  Vaping Use  . Vaping Use: Never used  Substance Use Topics  . Alcohol use: No    Alcohol/week: 0.0 standard drinks  . Drug use: No    Home Medications Prior to Admission medications   Medication Sig Start Date End Date Taking? Authorizing Provider  acetaminophen (TYLENOL) 500 MG tablet Take 1 tablet (500 mg total) by mouth every 6 (six) hours as needed. Patient taking differently: Take 500 mg by mouth every 6 (six) hours as needed for mild pain. 09/05/18  Yes Law, Bea Graff, PA-C  allopurinol (ZYLOPRIM) 300 MG tablet Take 150 mg by mouth daily.    Yes [provider]  ALPRAZolam (XANAX) 0.25 MG tablet Take 0.25 mg by mouth daily as needed for anxiety.    Yes [provider]  aspirin EC 81 MG EC tablet Take 1 tablet (81 mg total) by mouth daily. 05/14/20  Yes Black, Lezlie Octave, NP  atorvastatin (LIPITOR) 80 MG tablet TAKE 1 TABLET (80 MG TOTAL) BY MOUTH DAILY. Patient taking differently: Take 80 mg by mouth daily. 02/25/21 02/25/22 Yes Dessa Phi, DO  AURYXIA 1 GM 210 MG(Fe) tablet Take 420 mg by mouth 3 (three) times daily with meals.    Yes [provider]  B Complex-C-Folic Acid (DIALYVITE 416) 0.8 MG WAFR Take 1 tablet by mouth daily.  10/25/18  Yes [provider]  benzonatate (TESSALON) 100 MG capsule Take 1 capsule (100 mg total) by mouth every 8 (eight) hours. 05/18/21  Yes Judea Riches, MD  busPIRone (BUSPAR) 10 MG tablet TAKE 1 TABLET (10 MG TOTAL) BY MOUTH TWO TIMES DAILY. Patient taking differently: Take 10 mg by mouth 2 (two) times daily. 02/25/21 02/25/22 Yes Dessa Phi, DO  carvedilol (COREG) 6.25 MG tablet Take 6.25 mg by mouth 2 (two) times daily. 08/28/20  Yes [provider]  cinacalcet (SENSIPAR) 30 MG tablet Take 30 mg by  mouth daily. 01/04/20  Yes [provider]  doxycycline (VIBRAMYCIN) 100 MG capsule Take 1 capsule (100 mg total) by mouth 2 (two) times daily. 05/18/21  Yes Varney Biles, MD  lactulose (CHRONULAC) 10 GM/15ML solution Take 20 g by mouth daily as needed for mild constipation.   Yes [provider]  losartan (COZAAR) 50 MG tablet Take 50 mg by mouth daily. 09/25/20  Yes [provider]  ondansetron (ZOFRAN) 8 MG tablet Take 8 mg by mouth 2 (two) times daily as needed for nausea or vomiting.    Yes [provider]  zolpidem (AMBIEN) 10 MG tablet Take 10 mg by mouth at bedtime as needed for  sleep. 02/13/21  Yes [provider]  atorvastatin (LIPITOR) 80 MG tablet Take 1 tablet (80 mg total) by mouth daily. Patient not taking: Reported on 05/18/2021 02/25/21 03/27/21  Dessa Phi, DO    Allergies    Sulfa antibiotics and Adhesive [tape]  Review of Systems   Review of Systems  Constitutional: Positive for activity change.  Respiratory: Positive for cough. Negative for shortness of breath.   Cardiovascular: Negative for chest pain.  Gastrointestinal: Negative for nausea and vomiting.  All other systems reviewed and are negative.   Physical Exam Updated Vital Signs BP 95/83   Pulse 75   Temp 98.5 F (36.9 C) (Oral)   Resp 13   Ht 5\' 2"  (1.575 m)   Wt 52.2 kg   SpO2 97%   BMI 21.03 kg/m   Physical Exam Vitals and nursing note reviewed.  Constitutional:      Appearance: She is well-developed.  HENT:     Head: Normocephalic and atraumatic.  Cardiovascular:     Rate and Rhythm: Normal rate.  Pulmonary:     Effort: Pulmonary effort is normal.  Abdominal:     General: Bowel sounds are normal.  Musculoskeletal:     Cervical back: Normal range of motion and neck supple.  Skin:    General: Skin is warm and dry.  Neurological:     Mental Status: She is alert and oriented to person, place, and time.     ED Results / Procedures /  Treatments   Labs (all labs ordered are listed, but only abnormal results are displayed) Labs Reviewed  CBC WITH DIFFERENTIAL/PLATELET - Abnormal; Notable for the following components:      Result Value   RBC 2.90 (*)    Hemoglobin 8.9 (*)    HCT 27.8 (*)    RDW 16.6 (*)    All other components within normal limits  COMPREHENSIVE METABOLIC PANEL - Abnormal; Notable for the following components:   Chloride 94 (*)    BUN 39 (*)    Creatinine, Ser 6.88 (*)    Albumin 3.0 (*)    GFR, Estimated 6 (*)    All other components within normal limits  BRAIN NATRIURETIC PEPTIDE - Abnormal; Notable for the following components:   B Natriuretic Peptide 2,937.5 (*)    All other components within normal limits  TROPONIN I (HIGH SENSITIVITY) - Abnormal; Notable for the following components:   Troponin I (High Sensitivity) 169 (*)    All other components within normal limits  TROPONIN I (HIGH SENSITIVITY) - Abnormal; Notable for the following components:   Troponin I (High Sensitivity) 173 (*)    All other components within normal limits  RESP PANEL BY RT-PCR (FLU A&B, COVID) [id number]    EKG EKG Interpretation  Date/Time:  Sunday May 18 2021 06:38:59 EDT Ventricular Rate:  91 PR Interval:  178 QRS Duration: 84 QT Interval:  404 QTC Calculation: 496 R Axis:   74 Text Interpretation: Normal sinus rhythm Minimal voltage criteria for LVH, may be normal variant ( Cornell product ) Anterior infarct , age undetermined Abnormal ECG No acute changes No significant change since last tracing Confirmed by Varney Biles [id number]) on 05/18/2021 8:14:02 AM   Radiology DG Chest 2 View  Result Date: 05/18/2021 CLINICAL DATA:  70 year old female with shortness of breath and nonproductive cough. EXAM: CHEST - 2 VIEW COMPARISON:  Chest radiographs 04/26/2021 and earlier. FINDINGS: Stable cardiomegaly, tortuous and calcified thoracic aorta. Other mediastinal contours are within normal limits. Visualized  tracheal  air column is within normal limits. Stable large lung volumes. But bilateral reticulonodular opacity has regressed since April, and the residual is likely chronic. No pneumothorax or pleural effusion. No consolidation. Stable visualized osseous structures. Negative visible bowel gas pattern. IMPRESSION: 1. Chronic cardiomegaly, pulmonary hyperinflation, and suspected chronic interstitial disease. No acute cardiopulmonary abnormality. 2.  Aortic Atherosclerosis (ICD10-I70.0). Electronically Signed   By: Genevie Ann M.D.   On: 05/18/2021 07:23    Procedures Procedures   Medications Ordered in ED Medications - No data to display  ED Course  I have reviewed the triage vital signs and the nursing notes.  Pertinent labs & imaging results that were available during my care of the patient were reviewed by me and considered in my medical decision making (see chart for details).  Clinical Course as of 05/18/21 1505  Sun May 18, 2021  1505 Nurse informing that patient's BP was slightly soft at the time of discharge.  Her BP has been on the lower end, but she has been asymptomatic with it.  I will repeat EKG, ensure she is passing oral challenge and ambulating okay.  I reassessed her.  The low blood pressure is not secondary t worsening infection or ACS in her opinion.  She is not profoundly dehydrated or symptomatic from it.  COVID is negative.  I anticipate that she will be discharged [AN]    Clinical Course User Index [AN] Varney Biles, MD   MDM Rules/Calculators/A&P                          70 year old comes in a chief complaint of cough.  She denies any associated shortness of breath, chest pain.  She also does not have any orthopnea or PND.  Lungs are clear besides some bibasilar rales.  She is up-to-date with her hemodialysis.  History is not suggestive of any underlying infectious process, besides the fact that she has thicker, clear cough that is new.   I looked at her medications, she is on  lisinopril that could be contributing.  Other possibility includes walking pneumonia.  COVID-19 is also possible, we will send a test out for it as well.  She does not look volume overloaded, the cough also could be cardiac in nature.  We will get chest x-ray.  3:05 PM Results of the ER are reassuring.  Her high-sensitivity troponin is slightly elevated, but the delta is within the institutional cutoff for ruling out ischemia.  Clinical suspicion for an ischemia was quite low to begin with.  BNP is elevated, but it was accidentally sent out.  Patient has ESRD, and the elevated BNP secondary to it.  X-ray is clear.  Lung exam remains clear.  Had a long discussion with the patient.  She tells me her biggest concern was pneumonia.  She lives with her elderly mother and wants to make sure she does not pass anything to her.  It is possible she might have a walking pneumonia.  We will give her doxycycline, but I did discuss with her the need for her to follow-up with her PCP to ensure that if the cough does not improve, then we will get her lisinopril as the possible etiology.  Final Clinical Impression(s) / ED Diagnoses Final diagnoses:  Viral URI with cough    Rx / DC Orders ED Discharge Orders         Ordered    doxycycline (VIBRAMYCIN) 100 MG capsule  2  times daily        05/18/21 1346    benzonatate (TESSALON) 100 MG capsule  Every 8 hours        05/18/21 [place], Aayan Haskew, MD 05/18/21 Grub[place], Janautica Netzley, MD 05/18/21 1505

## 2021-05-18 NOTE — ED Notes (Signed)
Reviewed discharge instructions with patient. Follow-up care and medications reviewed. Patient verbalized understanding. Patient A&Ox4, VSS upon discharge. 

## 2021-05-18 NOTE — ED Notes (Signed)
Okay to DC pt per provider

## 2021-05-18 NOTE — ED Notes (Signed)
Unable to obtain blood specimens at this time. Will ask phlebotomy.

## 2021-05-18 NOTE — ED Triage Notes (Signed)
Pt reports cough X1week.  MWF - dialysis, has been complaint.   102/56 90pulse 18RR 97%RA

## 2021-05-18 NOTE — ED Notes (Signed)
Phlebotomy at bedside.

## 2021-05-19 DIAGNOSIS — Z992 Dependence on renal dialysis: Secondary | ICD-10-CM | POA: Diagnosis not present

## 2021-05-19 DIAGNOSIS — N2581 Secondary hyperparathyroidism of renal origin: Secondary | ICD-10-CM | POA: Diagnosis not present

## 2021-05-19 DIAGNOSIS — N186 End stage renal disease: Secondary | ICD-10-CM | POA: Diagnosis not present

## 2021-05-21 DIAGNOSIS — N186 End stage renal disease: Secondary | ICD-10-CM | POA: Diagnosis not present

## 2021-05-21 DIAGNOSIS — Z992 Dependence on renal dialysis: Secondary | ICD-10-CM | POA: Diagnosis not present

## 2021-05-21 DIAGNOSIS — N2581 Secondary hyperparathyroidism of renal origin: Secondary | ICD-10-CM | POA: Diagnosis not present

## 2021-05-23 DIAGNOSIS — N186 End stage renal disease: Secondary | ICD-10-CM | POA: Diagnosis not present

## 2021-05-23 DIAGNOSIS — Z992 Dependence on renal dialysis: Secondary | ICD-10-CM | POA: Diagnosis not present

## 2021-05-23 DIAGNOSIS — N2581 Secondary hyperparathyroidism of renal origin: Secondary | ICD-10-CM | POA: Diagnosis not present

## 2021-05-26 DIAGNOSIS — Z992 Dependence on renal dialysis: Secondary | ICD-10-CM | POA: Diagnosis not present

## 2021-05-26 DIAGNOSIS — N186 End stage renal disease: Secondary | ICD-10-CM | POA: Diagnosis not present

## 2021-05-26 DIAGNOSIS — N2581 Secondary hyperparathyroidism of renal origin: Secondary | ICD-10-CM | POA: Diagnosis not present

## 2021-05-28 ENCOUNTER — Telehealth: Payer: Self-pay | Admitting: Adult Health

## 2021-05-28 DIAGNOSIS — N2581 Secondary hyperparathyroidism of renal origin: Secondary | ICD-10-CM | POA: Diagnosis not present

## 2021-05-28 DIAGNOSIS — Z992 Dependence on renal dialysis: Secondary | ICD-10-CM | POA: Diagnosis not present

## 2021-05-28 DIAGNOSIS — N186 End stage renal disease: Secondary | ICD-10-CM | POA: Diagnosis not present

## 2021-05-28 NOTE — Telephone Encounter (Signed)
LVM for patient to call back to r/s appt due to Janett Billow being sick

## 2021-05-29 ENCOUNTER — Inpatient Hospital Stay: Payer: Self-pay | Admitting: Adult Health

## 2021-05-30 DIAGNOSIS — Z992 Dependence on renal dialysis: Secondary | ICD-10-CM | POA: Diagnosis not present

## 2021-05-30 DIAGNOSIS — N186 End stage renal disease: Secondary | ICD-10-CM | POA: Diagnosis not present

## 2021-05-30 DIAGNOSIS — N2581 Secondary hyperparathyroidism of renal origin: Secondary | ICD-10-CM | POA: Diagnosis not present

## 2021-06-02 DIAGNOSIS — Z992 Dependence on renal dialysis: Secondary | ICD-10-CM | POA: Diagnosis not present

## 2021-06-02 DIAGNOSIS — N186 End stage renal disease: Secondary | ICD-10-CM | POA: Diagnosis not present

## 2021-06-02 DIAGNOSIS — N2581 Secondary hyperparathyroidism of renal origin: Secondary | ICD-10-CM | POA: Diagnosis not present

## 2021-06-03 DIAGNOSIS — N186 End stage renal disease: Secondary | ICD-10-CM | POA: Diagnosis not present

## 2021-06-03 DIAGNOSIS — D631 Anemia in chronic kidney disease: Secondary | ICD-10-CM | POA: Diagnosis not present

## 2021-06-03 DIAGNOSIS — R059 Cough, unspecified: Secondary | ICD-10-CM | POA: Diagnosis not present

## 2021-06-04 DIAGNOSIS — Z992 Dependence on renal dialysis: Secondary | ICD-10-CM | POA: Diagnosis not present

## 2021-06-04 DIAGNOSIS — N2581 Secondary hyperparathyroidism of renal origin: Secondary | ICD-10-CM | POA: Diagnosis not present

## 2021-06-04 DIAGNOSIS — N186 End stage renal disease: Secondary | ICD-10-CM | POA: Diagnosis not present

## 2021-06-06 DIAGNOSIS — Z992 Dependence on renal dialysis: Secondary | ICD-10-CM | POA: Diagnosis not present

## 2021-06-06 DIAGNOSIS — N2581 Secondary hyperparathyroidism of renal origin: Secondary | ICD-10-CM | POA: Diagnosis not present

## 2021-06-06 DIAGNOSIS — N186 End stage renal disease: Secondary | ICD-10-CM | POA: Diagnosis not present

## 2021-06-09 DIAGNOSIS — N2581 Secondary hyperparathyroidism of renal origin: Secondary | ICD-10-CM | POA: Diagnosis not present

## 2021-06-09 DIAGNOSIS — N186 End stage renal disease: Secondary | ICD-10-CM | POA: Diagnosis not present

## 2021-06-09 DIAGNOSIS — Z992 Dependence on renal dialysis: Secondary | ICD-10-CM | POA: Diagnosis not present

## 2021-06-11 DIAGNOSIS — N2581 Secondary hyperparathyroidism of renal origin: Secondary | ICD-10-CM | POA: Diagnosis not present

## 2021-06-11 DIAGNOSIS — N186 End stage renal disease: Secondary | ICD-10-CM | POA: Diagnosis not present

## 2021-06-11 DIAGNOSIS — Z992 Dependence on renal dialysis: Secondary | ICD-10-CM | POA: Diagnosis not present

## 2021-06-12 DIAGNOSIS — Z992 Dependence on renal dialysis: Secondary | ICD-10-CM | POA: Diagnosis not present

## 2021-06-12 DIAGNOSIS — T8612 Kidney transplant failure: Secondary | ICD-10-CM | POA: Diagnosis not present

## 2021-06-12 DIAGNOSIS — N186 End stage renal disease: Secondary | ICD-10-CM | POA: Diagnosis not present

## 2021-06-13 DIAGNOSIS — N186 End stage renal disease: Secondary | ICD-10-CM | POA: Diagnosis not present

## 2021-06-13 DIAGNOSIS — Z992 Dependence on renal dialysis: Secondary | ICD-10-CM | POA: Diagnosis not present

## 2021-06-13 DIAGNOSIS — N2581 Secondary hyperparathyroidism of renal origin: Secondary | ICD-10-CM | POA: Diagnosis not present

## 2021-06-16 DIAGNOSIS — Z992 Dependence on renal dialysis: Secondary | ICD-10-CM | POA: Diagnosis not present

## 2021-06-16 DIAGNOSIS — N186 End stage renal disease: Secondary | ICD-10-CM | POA: Diagnosis not present

## 2021-06-16 DIAGNOSIS — N2581 Secondary hyperparathyroidism of renal origin: Secondary | ICD-10-CM | POA: Diagnosis not present

## 2021-06-18 DIAGNOSIS — Z992 Dependence on renal dialysis: Secondary | ICD-10-CM | POA: Diagnosis not present

## 2021-06-18 DIAGNOSIS — N186 End stage renal disease: Secondary | ICD-10-CM | POA: Diagnosis not present

## 2021-06-18 DIAGNOSIS — N2581 Secondary hyperparathyroidism of renal origin: Secondary | ICD-10-CM | POA: Diagnosis not present

## 2021-06-20 DIAGNOSIS — Z992 Dependence on renal dialysis: Secondary | ICD-10-CM | POA: Diagnosis not present

## 2021-06-20 DIAGNOSIS — N2581 Secondary hyperparathyroidism of renal origin: Secondary | ICD-10-CM | POA: Diagnosis not present

## 2021-06-20 DIAGNOSIS — N186 End stage renal disease: Secondary | ICD-10-CM | POA: Diagnosis not present

## 2021-06-23 DIAGNOSIS — N186 End stage renal disease: Secondary | ICD-10-CM | POA: Diagnosis not present

## 2021-06-23 DIAGNOSIS — N2581 Secondary hyperparathyroidism of renal origin: Secondary | ICD-10-CM | POA: Diagnosis not present

## 2021-06-23 DIAGNOSIS — Z992 Dependence on renal dialysis: Secondary | ICD-10-CM | POA: Diagnosis not present

## 2021-06-25 DIAGNOSIS — N186 End stage renal disease: Secondary | ICD-10-CM | POA: Diagnosis not present

## 2021-06-25 DIAGNOSIS — N2581 Secondary hyperparathyroidism of renal origin: Secondary | ICD-10-CM | POA: Diagnosis not present

## 2021-06-25 DIAGNOSIS — Z992 Dependence on renal dialysis: Secondary | ICD-10-CM | POA: Diagnosis not present

## 2021-06-27 DIAGNOSIS — N186 End stage renal disease: Secondary | ICD-10-CM | POA: Diagnosis not present

## 2021-06-27 DIAGNOSIS — N2581 Secondary hyperparathyroidism of renal origin: Secondary | ICD-10-CM | POA: Diagnosis not present

## 2021-06-27 DIAGNOSIS — Z992 Dependence on renal dialysis: Secondary | ICD-10-CM | POA: Diagnosis not present

## 2021-06-30 DIAGNOSIS — Z992 Dependence on renal dialysis: Secondary | ICD-10-CM | POA: Diagnosis not present

## 2021-06-30 DIAGNOSIS — N186 End stage renal disease: Secondary | ICD-10-CM | POA: Diagnosis not present

## 2021-06-30 DIAGNOSIS — N2581 Secondary hyperparathyroidism of renal origin: Secondary | ICD-10-CM | POA: Diagnosis not present

## 2021-07-02 DIAGNOSIS — Z992 Dependence on renal dialysis: Secondary | ICD-10-CM | POA: Diagnosis not present

## 2021-07-02 DIAGNOSIS — N186 End stage renal disease: Secondary | ICD-10-CM | POA: Diagnosis not present

## 2021-07-02 DIAGNOSIS — N2581 Secondary hyperparathyroidism of renal origin: Secondary | ICD-10-CM | POA: Diagnosis not present

## 2021-07-04 DIAGNOSIS — Z992 Dependence on renal dialysis: Secondary | ICD-10-CM | POA: Diagnosis not present

## 2021-07-04 DIAGNOSIS — N186 End stage renal disease: Secondary | ICD-10-CM | POA: Diagnosis not present

## 2021-07-04 DIAGNOSIS — N2581 Secondary hyperparathyroidism of renal origin: Secondary | ICD-10-CM | POA: Diagnosis not present

## 2021-07-07 ENCOUNTER — Other Ambulatory Visit: Payer: Self-pay

## 2021-07-07 ENCOUNTER — Emergency Department (HOSPITAL_COMMUNITY): Payer: Medicare HMO

## 2021-07-07 ENCOUNTER — Emergency Department (HOSPITAL_COMMUNITY)
Admission: EM | Admit: 2021-07-07 | Discharge: 2021-07-07 | Disposition: A | Discharge status: Home / Self Care | Payer: Medicare HMO | Attending: Student | Admitting: Student

## 2021-07-07 ENCOUNTER — Encounter (HOSPITAL_COMMUNITY): Payer: Self-pay

## 2021-07-07 DIAGNOSIS — R5383 Other fatigue: Secondary | ICD-10-CM | POA: Diagnosis not present

## 2021-07-07 DIAGNOSIS — R0602 Shortness of breath: Secondary | ICD-10-CM | POA: Diagnosis not present

## 2021-07-07 DIAGNOSIS — Z5321 Procedure and treatment not carried out due to patient leaving prior to being seen by health care provider: Secondary | ICD-10-CM | POA: Diagnosis not present

## 2021-07-07 DIAGNOSIS — R5381 Other malaise: Secondary | ICD-10-CM | POA: Diagnosis not present

## 2021-07-07 DIAGNOSIS — I517 Cardiomegaly: Secondary | ICD-10-CM | POA: Diagnosis not present

## 2021-07-07 DIAGNOSIS — Z992 Dependence on renal dialysis: Secondary | ICD-10-CM | POA: Diagnosis not present

## 2021-07-07 DIAGNOSIS — N2581 Secondary hyperparathyroidism of renal origin: Secondary | ICD-10-CM | POA: Diagnosis not present

## 2021-07-07 DIAGNOSIS — I499 Cardiac arrhythmia, unspecified: Secondary | ICD-10-CM | POA: Diagnosis not present

## 2021-07-07 DIAGNOSIS — N186 End stage renal disease: Secondary | ICD-10-CM | POA: Diagnosis not present

## 2021-07-07 NOTE — ED Triage Notes (Signed)
Patient arrived by [facility] from home with SOB episode that occurred after boiling food/water was left on stove. Patient reports that this has all resolved now and due for dialysis today.

## 2021-07-07 NOTE — ED Provider Notes (Signed)
Emergency Medicine Provider Triage Evaluation Note  [name] , a 69 y.o. female  was evaluated in triage.  Pt complains of shortness of breath.  Patient had acute onset shortness of breath this morning.  Improved when she rest.  Feels better now.  No fever, chest pain, cough.  Review of Systems  Positive: sob Negative: cp  Physical Exam  BP (!) 137/105 (BP Location: Left Leg)   Pulse 74   Temp 97.7 F (36.5 C)   Resp 15   SpO2 98%  Gen:   Awake, no distress   Resp:  Normal effort, clear lung sounds MSK:   Moves extremities without difficulty. No pitting edema   Medical Decision Making  Medically screening exam initiated at 9:41 AM.  Appropriate orders placed.  [name] was informed that the remainder of the evaluation will be completed by another provider, this initial triage assessment does not replace that evaluation, and the importance of remaining in the ED until their evaluation is complete.  Labs, cxr, ekg   Franchot Heidelberg, PA-C 07/07/21 0037    Daleen Bo, MD 07/08/21 [phone]

## 2021-07-07 NOTE — ED Notes (Signed)
Pt not responding for vitals  

## 2021-07-09 DIAGNOSIS — N186 End stage renal disease: Secondary | ICD-10-CM | POA: Diagnosis not present

## 2021-07-09 DIAGNOSIS — Z992 Dependence on renal dialysis: Secondary | ICD-10-CM | POA: Diagnosis not present

## 2021-07-09 DIAGNOSIS — N2581 Secondary hyperparathyroidism of renal origin: Secondary | ICD-10-CM | POA: Diagnosis not present

## 2021-07-11 DIAGNOSIS — N2581 Secondary hyperparathyroidism of renal origin: Secondary | ICD-10-CM | POA: Diagnosis not present

## 2021-07-11 DIAGNOSIS — N186 End stage renal disease: Secondary | ICD-10-CM | POA: Diagnosis not present

## 2021-07-11 DIAGNOSIS — Z992 Dependence on renal dialysis: Secondary | ICD-10-CM | POA: Diagnosis not present

## 2021-07-13 DIAGNOSIS — Z992 Dependence on renal dialysis: Secondary | ICD-10-CM | POA: Diagnosis not present

## 2021-07-13 DIAGNOSIS — N186 End stage renal disease: Secondary | ICD-10-CM | POA: Diagnosis not present

## 2021-07-13 DIAGNOSIS — T8612 Kidney transplant failure: Secondary | ICD-10-CM | POA: Diagnosis not present

## 2021-07-14 DIAGNOSIS — N2581 Secondary hyperparathyroidism of renal origin: Secondary | ICD-10-CM | POA: Diagnosis not present

## 2021-07-14 DIAGNOSIS — N186 End stage renal disease: Secondary | ICD-10-CM | POA: Diagnosis not present

## 2021-07-14 DIAGNOSIS — Z992 Dependence on renal dialysis: Secondary | ICD-10-CM | POA: Diagnosis not present

## 2021-07-15 ENCOUNTER — Inpatient Hospital Stay: Payer: Self-pay | Admitting: Adult Health

## 2021-07-16 DIAGNOSIS — Z992 Dependence on renal dialysis: Secondary | ICD-10-CM | POA: Diagnosis not present

## 2021-07-16 DIAGNOSIS — N186 End stage renal disease: Secondary | ICD-10-CM | POA: Diagnosis not present

## 2021-07-16 DIAGNOSIS — N2581 Secondary hyperparathyroidism of renal origin: Secondary | ICD-10-CM | POA: Diagnosis not present

## 2021-07-17 ENCOUNTER — Encounter: Payer: Self-pay | Admitting: Adult Health

## 2021-07-17 ENCOUNTER — Ambulatory Visit (INDEPENDENT_AMBULATORY_CARE_PROVIDER_SITE_OTHER): Payer: Medicare HMO | Admitting: Adult Health

## 2021-07-17 VITALS — BP 162/77 | HR 83 | Ht 62.0 in | Wt 108.0 lb

## 2021-07-17 DIAGNOSIS — R2689 Other abnormalities of gait and mobility: Secondary | ICD-10-CM

## 2021-07-17 DIAGNOSIS — G459 Transient cerebral ischemic attack, unspecified: Secondary | ICD-10-CM

## 2021-07-17 DIAGNOSIS — Z8673 Personal history of transient ischemic attack (TIA), and cerebral infarction without residual deficits: Secondary | ICD-10-CM

## 2021-07-17 NOTE — Progress Notes (Signed)
[facility] [address] [place]. [place] [address] [phone]       HOSPITAL FOLLOW UP NOTE  [name] Date of Birth:  Jan 27, 1951 Medical Record Number:  [id number]   Reason for Referral:  hospital stroke follow up    SUBJECTIVE:   CHIEF COMPLAINT:  Chief Complaint  Patient presents with   Follow-up    RM 3 alone Pt is well, has been having a lot of imbalance and slow gait, wekness     HPI:   [name] is a 70 y.o. female with history of multiple medical conditions including ESRD on HD, history of TIA/stroke, HTN, HLD, advanced age, former tobacco use, CAD and CHF who presented on 02/21/2021 via EMS from home as a Code Stroke with acute onset of left facial droop, confusion and slurred speech.  Personally reviewed hospitalization pertinent progress notes, lab work and imaging with summary provided.  Evaluated by Dr. Leonie Man for likely seizure vs TIA - per report, similar episodes in the past but this was more severe.  EEG unremarkable.  Stroke work-up negative for acute findings and showed unchanged appearance of multiple chronic microhemorrhages and chronic small vessel disease.  Carotid Dopplers unremarkable.  EF 60 to 65%.  LDL 117.  A1c 5.0.  Continue aspirin 81 mg daily and increase home dose atorvastatin to 80 mg daily. Evaluated by therapies who recommended [facility] PT/OT with 24-hour supervision/assistance for generalized weakness and imbalance. Eval by SLP -resolved issues with mentation and speech.  Psych eval - started buspar and consider referral to outpatient psychiatric clinic. D/c'd home on 02/25/2021.  Today, 07/17/2021, [name] is being seen for hospital follow-up unaccompanied.  She was brought to the office via [facility].  She was previously seen in office on 02/15/2020 for f/u regarding mild cog concerns and dizziness - stable at that time.  Stable from stroke standpoint since d/c without new or reoccurring stroke/TIA symptoms.  She does  complain of generalized weakness and gait impairment which has been going on for the past year.  She has since completed home health therapy but greatly limited ambulation and activity due to dyspnea with exertion.  Ambulates with Rollator walker.  Living with her mother and sister - sister assists with care as needed and cares for their mother with Alzheimer's. Cognition stable - greatly improved even prior to d/c. MMSE today 27/30.  Compliant and atorvastatin -denies side effects. She has had multiple ED evaluations since discharge for reoccurring complaints of dysphagia present related to multifactorial volume overload and pulmonary edema. She was seen by pulm 02/2021 but has not yet had f/u.  She continues to receive HD M/W/F.  No further concerns at this time.    ROS:   14 system review of systems performed and negative with exception of those listed in HPI  PMH:  Past Medical History:  Diagnosis Date   Adenomatous polyp of colon 10/2010, 2006, 2015   Anemia in CKD (chronic kidney disease) 11/07/2012   s/p blood transfusion.    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  Had some early rejection then stable function for years, then had slow decline of function and went back  on hemodialysis in 2012.  Gets HD TTS schedule at [facility] still using L forearm AVF.      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[facility])    left upper arm   Pulmonary edema  12/2019   Stroke ([place]) 11/2015   TIA   Weight loss, unintentional     PSH:  Past Surgical History:  Procedure Laterality Date   AV FISTULA PLACEMENT     for dialysis   AV FISTULA PLACEMENT Left 11/22/2015   Procedure: ARTERIOVENOUS (AV) FISTULA CREATION-LEFT BRACHIOCEPHALIC;  Surgeon: Serafina Mitchell, MD;  Location: [place];  Service: Vascular;  Laterality: Left;   AV FISTULA PLACEMENT Right 03/15/2020   Procedure: INSERTION OF ARTERIOVENOUS (AV) GORE-TEX GRAFT ARM ( BRACHIAL AXILLARY );  Surgeon: Katha Cabal, MD;  Location: [facility];  Service: Vascular;  Laterality: Right;   BACK SURGERY     CERVICAL FUSION     CHOLECYSTECTOMY  12/02/2012   Procedure: LAPAROSCOPIC CHOLECYSTECTOMY WITH INTRAOPERATIVE CHOLANGIOGRAM;  Surgeon: Edward Jolly, MD;  Location: [place];  Service: General;  Laterality: N/A;   EYE SURGERY Bilateral    cataract surgery   EYE SURGERY Left 2019   laser   HEMATOMA EVACUATION Left 12/24/2016   Procedure: EVACUATION HEMATOMA LEFT UPPER ARM;  Surgeon: Waynetta Sandy, MD;  Location: [place];  Service: Vascular;  Laterality: Left;   I & D EXTREMITY Left 12/31/2016   Procedure: IRRIGATION AND DEBRIDEMENT EXTREMITY;  Surgeon: Angelia Mould, MD;  Location: [place];  Service: Vascular;  Laterality: Left;   INSERTION OF DIALYSIS CATHETER Right 12/24/2016   Procedure: INSERTION OF DIALYSIS CATHETER;  Surgeon: Waynetta Sandy, MD;  Location: [place];  Service: Vascular;  Laterality: Right;   INSERTION OF DIALYSIS CATHETER Right 02/04/2017   Procedure: INSERTION OF DIALYSIS CATHETER;  Surgeon: Waynetta Sandy, MD;  Location: [place];  Service: Vascular;  Laterality: Right;   [place] Left 10/23/2016   Procedure: Fistulagram;  Surgeon: Elam Dutch, MD;  Location: [place] [facility];  Service: Cardiovascular;  Laterality: Left;   PERIPHERAL VASCULAR THROMBECTOMY Right 04/16/2020   Procedure:  PERIPHERAL VASCULAR THROMBECTOMY;  Surgeon: Katha Cabal, MD;  Location: [place] [facility];  Service: Cardiovascular;  Laterality: Right;   RESECTION OF ARTERIOVENOUS FISTULA ANEURYSM Left 11/22/2015   Procedure: RESECTION OF LEFT RADIOCEPHALIC FISTULA ANEURYSM ;  Surgeon: Serafina Mitchell, MD;  Location: [place] [facility];  Service: Vascular;  Laterality: Left;   REVISON OF ARTERIOVENOUS FISTULA Left 12/22/2016   Procedure: REVISON OF LEFT ARTERIOVENOUS FISTULA;  Surgeon: Waynetta Sandy, MD;  Location: [place] [facility];  Service: Vascular;  Laterality: Left;   REVISON OF ARTERIOVENOUS FISTULA Left 02/04/2017   Procedure: REVISON OF LEFT UPPER ARM ARTERIOVENOUS FISTULA;  Surgeon: Waynetta Sandy, MD;  Location: [place];  Service: Vascular;  Laterality: Left;   UPPER EXTREMITY ANGIOGRAPHY Bilateral 09/19/2019   Procedure: UPPER EXTREMITY ANGIOGRAPHY;  Surgeon: Katha Cabal, MD;  Location: [place] [facility];  Service: Cardiovascular;  Laterality: Bilateral;    Social History:  Social History  Socioeconomic History   Marital status: Widowed    Spouse name: Not on file   Number of children: 2   Years of education: Not on file   Highest education level: Not on file  Occupational History   Not on file  Tobacco Use   Smoking status: Former    Types: Cigarettes    Quit date: 12/31/1991    Years since quitting: 29.5   Smokeless tobacco: Never  Vaping Use   Vaping Use: Never used  Substance and Sexual Activity   Alcohol use: No    Alcohol/week: 0.0 standard drinks   Drug use: No   Sexual activity: Not Currently  Other Topics Concern   Not on file  Social History Narrative   Living with her mother    Right handed   Caffeine: only decaf clears ("no brown sodas" or coffee d/t kidney disease)   Low potassium foods d/t kidney disease   Social Determinants of Health   Financial Resource Strain: Not on file  Food Insecurity: Not on file  Transportation Needs: Not on file   Physical Activity: Not on file  Stress: Not on file  Social Connections: Not on file  Intimate Partner Violence: Not on file    Family History:  Family History  Problem Relation Age of Onset   Colon cancer Brother    Cancer Brother    Coronary artery disease Mother 71   Hyperlipidemia Mother    Hypertension Mother    Stroke Maternal Aunt    Esophageal cancer Neg Hx    Stomach cancer Neg Hx    Rectal cancer Neg Hx     Medications:   Current Outpatient Medications on File Prior to Visit  Medication Sig Dispense Refill   acetaminophen (TYLENOL) 500 MG tablet Take 1 tablet (500 mg total) by mouth every 6 (six) hours as needed. (Patient taking differently: Take 500 mg by mouth every 6 (six) hours as needed for mild pain.) 30 tablet 0   allopurinol (ZYLOPRIM) 300 MG tablet Take 150 mg by mouth daily.      ALPRAZolam (XANAX) 0.25 MG tablet Take 0.25 mg by mouth daily as needed for anxiety.      aspirin EC 81 MG EC tablet Take 1 tablet (81 mg total) by mouth daily.     atorvastatin (LIPITOR) 80 MG tablet TAKE 1 TABLET (80 MG TOTAL) BY MOUTH DAILY. (Patient taking differently: Take 80 mg by mouth daily.) 30 tablet 0   AURYXIA 1 GM 210 MG(Fe) tablet Take 420 mg by mouth 3 (three) times daily with meals.      busPIRone (BUSPAR) 10 MG tablet TAKE 1 TABLET (10 MG TOTAL) BY MOUTH TWO TIMES DAILY. (Patient taking differently: Take 10 mg by mouth 2 (two) times daily.) 60 tablet 0   cinacalcet (SENSIPAR) 30 MG tablet Take 30 mg by mouth daily.     doxycycline (VIBRAMYCIN) 100 MG capsule Take 1 capsule (100 mg total) by mouth 2 (two) times daily. 14 capsule 0   lactulose (CHRONULAC) 10 GM/15ML solution Take 20 g by mouth daily as needed for mild constipation.     losartan (COZAAR) 50 MG tablet Take 50 mg by mouth daily.     ondansetron (ZOFRAN) 8 MG tablet Take 8 mg by mouth 2 (two) times daily as needed for nausea or vomiting.   0   zolpidem (AMBIEN) 10 MG tablet Take 10 mg by mouth at bedtime as  needed for sleep.     No current facility-administered  medications on file prior to visit.    Allergies:   Allergies  Allergen Reactions   Sulfa Antibiotics Other (See Comments)    Per patient, both parents allergic-so will not take   Adhesive [Tape] Itching      OBJECTIVE:  Physical Exam  Vitals:   07/17/21 1337  BP: (!) 162/77  Pulse: 83  Weight: 108 lb (49 kg)  Height: 5\' 2"  (1.575 m)   Body mass index is 19.75 kg/m. No results found.  General: Frail very pleasant elderly African-American female, seated, in no evident distress Head: head normocephalic and atraumatic.   Neck: supple with no carotid or supraclavicular bruits Cardiovascular: regular rate and rhythm, no murmurs Musculoskeletal: no deformity   Neurologic Exam Mental Status: Awake and fully alert.  Fluent speech and language.  Oriented to place and time. Recent and remote memory intact. Attention span, concentration and fund of knowledge appropriate. Mood and affect appropriate. MMSE today 27/30 Cranial Nerves: Fundoscopic exam reveals sharp disc margins. Pupils equal, briskly reactive to light. Extraocular movements full without nystagmus. Visual fields full to confrontation. Hearing intact. Facial sensation intact. Face, tongue, palate moves normally and symmetrically.  Motor: generalized weakness and muscle atrophy  Sensory.: intact to touch , pinprick , position and vibratory sensation.  Coordination: Rapid alternating movements normal in all extremities. Finger-to-nose and heel-to-shin performed accurately bilaterally. Gait and Station: currently in w/c Reflexes: 1+ and symmetric. Toes downgoing.   MMSE - Mini Mental State Exam 07/17/2021 07/13/2018  Orientation to time 4 3  Orientation to Place 5 3  Registration 3 3  Attention/ Calculation 5 1  Recall 3 2  Language- name 2 objects 2 2  Language- repeat 1 1  Language- follow 3 step command 2 3  Language- read & follow direction 1 1  Write a  sentence 1 1  Copy design 0 1  Total score 27 21         ASSESSMENT: [name] is a 70 y.o. year old female with recent seizure vs TIA on 02/21/2021 after presenting with acute onset left facial droop, confusion and slurred speech with similar episodes in the past although less severe per patient. Vascular risk factors include HTN, HLD, hx of stroke/TIA, chronic microhemorrhages, advanced age, former tobacco use, CHF and ESRD on HD.      PLAN:  TIA vs seizure: No reoccurrence EEG negative for seizure activity History of stroke: Continue aspirin 81 mg daily  and atorvastatin 80 mg daily for secondary stroke prevention.  Discussed secondary stroke prevention measures and importance of close PCP follow up for aggressive stroke risk factor management. I have gone over the pathophysiology of stroke, warning signs and symptoms, risk factors and their management in some detail with instructions to go to the closest emergency room for symptoms of concern. HTN: BP goal <130/90.  Monitored by PCP/nephrology HLD: LDL goal <70. Recent LDL 117 -continue atorvastatin 80 mg daily.  Gait impairment/generalized weakness: Likely multifactorial in setting of deconditioning due to dyspnea with exertion -she was encouraged to schedule follow-up with pulmonology Dr. Lamonte Sakai as further recommended evaluation and follow-up not yet completed    Follow up in 6 months or call earlier if needed   CC:  [place] provider: Dr. Leonie Man PCP: [name], MD    I spent 46 minutes of face-to-face and non-face-to-face time with patient.  This included previsit chart review including review of recent hospitalization, lab review, study review, electronic health record documentation, patient education regarding recent TIA vs  seizure, history of prior strokes, secondary stroke prevention measures and importance of managing stroke risk factors, ambulation concerns and further discussion as above and answered all other  questions to patient satisfaction  Frann Rider, AGNP-BC  [facility] [address] [place], [place] [address]  Phone [phone] Fax [phone] Note: This document was prepared with digital dictation and possible smart phrase technology. Any transcriptional errors that result from this process are unintentional.

## 2021-07-17 NOTE — Patient Instructions (Signed)
Recommend you contact Dr. Lamonte Sakai at [facility]  [phone]  Continue aspirin 81 mg daily  and atorvastatin 80mg  daily  for secondary stroke prevention  Continue to follow up with PCP regarding cholesterol and blood pressure management  Maintain strict control of hypertension with blood pressure goal below 130/90 and cholesterol with LDL cholesterol (bad cholesterol) goal below 70 mg/dL.       Followup in the future with me in 6 months or call earlier if needed       Thank you for coming to see [place] at [facility]. I hope we have been able to provide you high quality care today.  You may receive a patient satisfaction survey over the next few weeks. We would appreciate your feedback and comments so that we may continue to improve ourselves and the health of our patients.

## 2021-07-17 NOTE — Progress Notes (Signed)
I agree with the above plan 

## 2021-07-18 DIAGNOSIS — N2581 Secondary hyperparathyroidism of renal origin: Secondary | ICD-10-CM | POA: Diagnosis not present

## 2021-07-18 DIAGNOSIS — N186 End stage renal disease: Secondary | ICD-10-CM | POA: Diagnosis not present

## 2021-07-18 DIAGNOSIS — Z992 Dependence on renal dialysis: Secondary | ICD-10-CM | POA: Diagnosis not present

## 2021-07-21 DIAGNOSIS — Z992 Dependence on renal dialysis: Secondary | ICD-10-CM | POA: Diagnosis not present

## 2021-07-21 DIAGNOSIS — N2581 Secondary hyperparathyroidism of renal origin: Secondary | ICD-10-CM | POA: Diagnosis not present

## 2021-07-21 DIAGNOSIS — N186 End stage renal disease: Secondary | ICD-10-CM | POA: Diagnosis not present

## 2021-07-23 DIAGNOSIS — Z992 Dependence on renal dialysis: Secondary | ICD-10-CM | POA: Diagnosis not present

## 2021-07-23 DIAGNOSIS — N186 End stage renal disease: Secondary | ICD-10-CM | POA: Diagnosis not present

## 2021-07-23 DIAGNOSIS — N2581 Secondary hyperparathyroidism of renal origin: Secondary | ICD-10-CM | POA: Diagnosis not present

## 2021-07-25 DIAGNOSIS — Z992 Dependence on renal dialysis: Secondary | ICD-10-CM | POA: Diagnosis not present

## 2021-07-25 DIAGNOSIS — N186 End stage renal disease: Secondary | ICD-10-CM | POA: Diagnosis not present

## 2021-07-25 DIAGNOSIS — N2581 Secondary hyperparathyroidism of renal origin: Secondary | ICD-10-CM | POA: Diagnosis not present

## 2021-07-25 IMAGING — CR DG CHEST 1V
1 series · 1 of 1 positions shown · non-contrast
Comparison: October 14, 2020

CLINICAL DATA: Chest pain and shortness of breath

EXAM:
CHEST  1 VIEW

[chest ap]
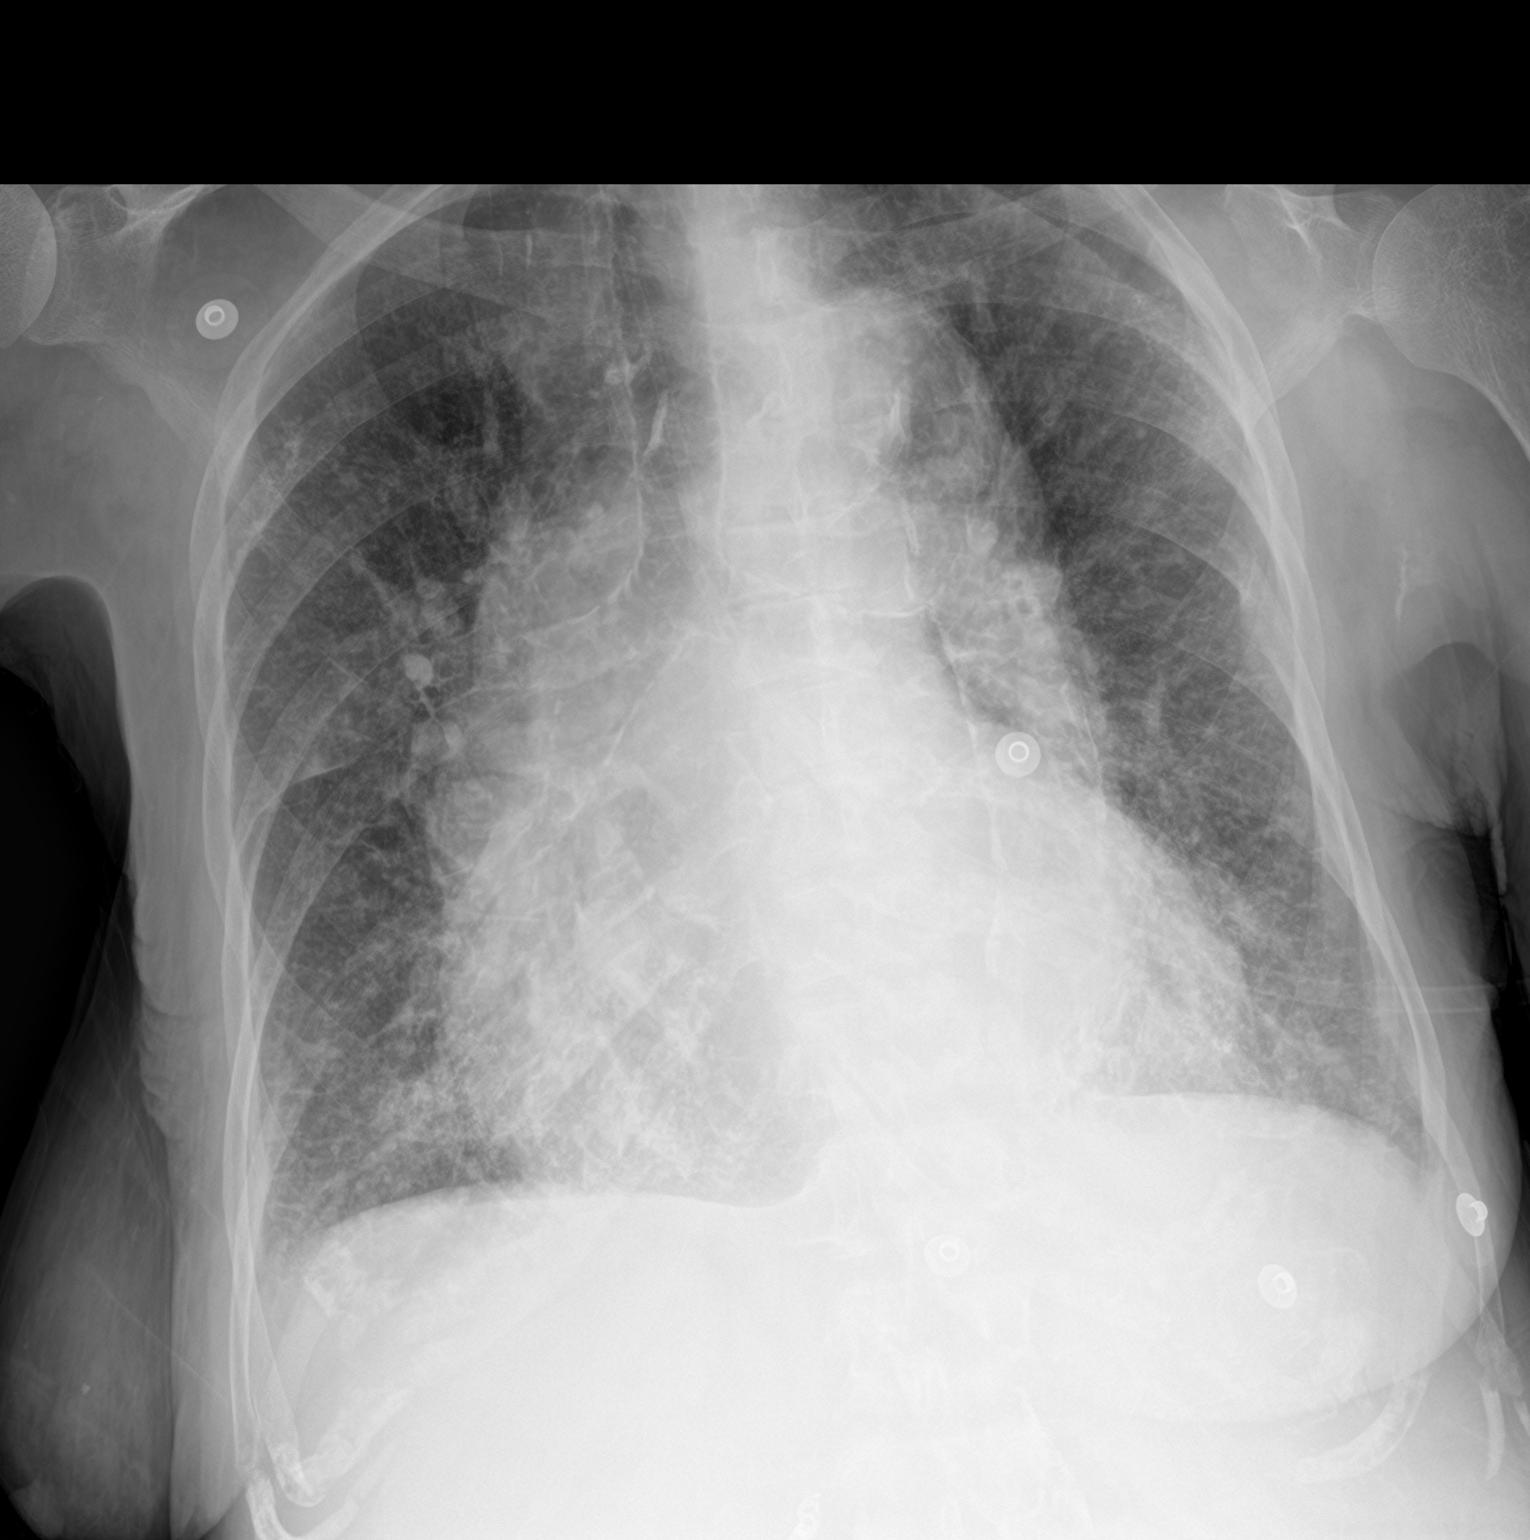

[1 of 1 positions shown; findings below may reference images not displayed]

FINDINGS: The heart size and mediastinal contours are mildly enlarged.
Increased interstitial markings are seen throughout both lungs. No
pleural effusion is seen. Aortic knob calcifications are seen. The
visualized skeletal structures are unremarkable.
IMPRESSION: Cardiomegaly and interstitial edema

## 2021-07-27 ENCOUNTER — Emergency Department (HOSPITAL_COMMUNITY)
Admission: EM | Admit: 2021-07-27 | Discharge: 2021-07-27 | Disposition: A | Discharge status: Home / Self Care | Payer: Medicare HMO | Attending: Emergency Medicine | Admitting: Emergency Medicine

## 2021-07-27 ENCOUNTER — Other Ambulatory Visit: Payer: Self-pay

## 2021-07-27 ENCOUNTER — Encounter (HOSPITAL_COMMUNITY): Payer: Self-pay

## 2021-07-27 ENCOUNTER — Emergency Department (HOSPITAL_COMMUNITY): Payer: Medicare HMO

## 2021-07-27 DIAGNOSIS — R0989 Other specified symptoms and signs involving the circulatory and respiratory systems: Secondary | ICD-10-CM | POA: Diagnosis not present

## 2021-07-27 DIAGNOSIS — S20211A Contusion of right front wall of thorax, initial encounter: Secondary | ICD-10-CM | POA: Insufficient documentation

## 2021-07-27 DIAGNOSIS — J811 Chronic pulmonary edema: Secondary | ICD-10-CM | POA: Diagnosis not present

## 2021-07-27 DIAGNOSIS — R0602 Shortness of breath: Secondary | ICD-10-CM | POA: Diagnosis not present

## 2021-07-27 DIAGNOSIS — S299XXA Unspecified injury of thorax, initial encounter: Secondary | ICD-10-CM | POA: Diagnosis present

## 2021-07-27 DIAGNOSIS — Z87891 Personal history of nicotine dependence: Secondary | ICD-10-CM | POA: Insufficient documentation

## 2021-07-27 DIAGNOSIS — X58XXXA Exposure to other specified factors, initial encounter: Secondary | ICD-10-CM | POA: Insufficient documentation

## 2021-07-27 DIAGNOSIS — R0789 Other chest pain: Secondary | ICD-10-CM | POA: Diagnosis not present

## 2021-07-27 DIAGNOSIS — I132 Hypertensive heart and chronic kidney disease with heart failure and with stage 5 chronic kidney disease, or end stage renal disease: Secondary | ICD-10-CM | POA: Insufficient documentation

## 2021-07-27 DIAGNOSIS — I5033 Acute on chronic diastolic (congestive) heart failure: Secondary | ICD-10-CM | POA: Insufficient documentation

## 2021-07-27 DIAGNOSIS — Z79899 Other long term (current) drug therapy: Secondary | ICD-10-CM | POA: Insufficient documentation

## 2021-07-27 DIAGNOSIS — Z7982 Long term (current) use of aspirin: Secondary | ICD-10-CM | POA: Diagnosis not present

## 2021-07-27 DIAGNOSIS — I517 Cardiomegaly: Secondary | ICD-10-CM | POA: Diagnosis not present

## 2021-07-27 DIAGNOSIS — R0981 Nasal congestion: Secondary | ICD-10-CM | POA: Diagnosis not present

## 2021-07-27 DIAGNOSIS — K30 Functional dyspepsia: Secondary | ICD-10-CM | POA: Diagnosis not present

## 2021-07-27 DIAGNOSIS — I2510 Atherosclerotic heart disease of native coronary artery without angina pectoris: Secondary | ICD-10-CM | POA: Insufficient documentation

## 2021-07-27 DIAGNOSIS — N186 End stage renal disease: Secondary | ICD-10-CM | POA: Insufficient documentation

## 2021-07-27 DIAGNOSIS — Z992 Dependence on renal dialysis: Secondary | ICD-10-CM | POA: Diagnosis not present

## 2021-07-27 DIAGNOSIS — I10 Essential (primary) hypertension: Secondary | ICD-10-CM | POA: Diagnosis not present

## 2021-07-27 DIAGNOSIS — Z20822 Contact with and (suspected) exposure to covid-19: Secondary | ICD-10-CM | POA: Diagnosis not present

## 2021-07-27 DIAGNOSIS — R0902 Hypoxemia: Secondary | ICD-10-CM | POA: Diagnosis not present

## 2021-07-27 LAB — COMPREHENSIVE METABOLIC PANEL
ALT: 11 U/L (ref 0–44)
AST: 23 U/L (ref 15–41)
Albumin: 3.4 g/dL — ABNORMAL LOW (ref 3.5–5.0)
Alkaline Phosphatase: 165 U/L — ABNORMAL HIGH (ref 38–126)
Anion gap: 15 (ref 5–15)
BUN: 37 mg/dL — ABNORMAL HIGH (ref 8–23)
CO2: 29 mmol/L (ref 22–32)
Calcium: 9.2 mg/dL (ref 8.9–10.3)
Chloride: 94 mmol/L — ABNORMAL LOW (ref 98–111)
Creatinine, Ser: 6.02 mg/dL — ABNORMAL HIGH (ref 0.44–1.00)
GFR, Estimated: 7 mL/min — ABNORMAL LOW (ref >60)
Glucose, Bld: 88 mg/dL (ref 70–99)
Potassium: 4.2 mmol/L (ref 3.5–5.1)
Sodium: 138 mmol/L (ref 135–145)
Total Bilirubin: 0.9 mg/dL (ref 0.3–1.2)
Total Protein: 6.7 g/dL (ref 6.5–8.1)

## 2021-07-27 LAB — CBC WITH DIFFERENTIAL/PLATELET
Abs Immature Granulocytes: 0.02 10*3/uL (ref 0.00–0.07)
Basophils Absolute: 0.1 10*3/uL (ref 0.0–0.1)
Basophils Relative: 1 %
Eosinophils Absolute: 0.3 10*3/uL (ref 0.0–0.5)
Eosinophils Relative: 6 %
HCT: 37.7 % (ref 36.0–46.0)
Hemoglobin: 12.5 g/dL (ref 12.0–15.0)
Immature Granulocytes: 0 %
Lymphocytes Relative: 25 %
Lymphs Abs: 1.4 10*3/uL (ref 0.7–4.0)
MCH: 30.9 pg (ref 26.0–34.0)
MCHC: 33.2 g/dL (ref 30.0–36.0)
MCV: 93.3 fL (ref 80.0–100.0)
Monocytes Absolute: 0.7 10*3/uL (ref 0.1–1.0)
Monocytes Relative: 12 %
Neutro Abs: 3.2 10*3/uL (ref 1.7–7.7)
Neutrophils Relative %: 56 %
Platelets: 195 10*3/uL (ref 150–400)
RBC: 4.04 MIL/uL (ref 3.87–5.11)
RDW: 15.7 % — ABNORMAL HIGH (ref 11.5–15.5)
WBC: 5.7 10*3/uL (ref 4.0–10.5)
nRBC: 0 % (ref 0.0–0.2)

## 2021-07-27 LAB — RESP PANEL BY RT-PCR (FLU A&B, COVID) ARPGX2
Influenza A by PCR: NEGATIVE
Influenza B by PCR: NEGATIVE
SARS Coronavirus 2 by RT PCR: NEGATIVE

## 2021-07-27 LAB — TROPONIN I (HIGH SENSITIVITY)
Troponin I (High Sensitivity): 24 ng/L — ABNORMAL HIGH (ref <18)
Troponin I (High Sensitivity): 26 ng/L — ABNORMAL HIGH (ref <18)

## 2021-07-27 MED ORDER — SUCRALFATE 1 G PO TABS: 1.0000 g | ORAL_TABLET | Freq: Three times a day (TID) | ORAL | 0 refills | Status: DC

## 2021-07-27 MED ORDER — SUCRALFATE 1 G PO TABS
1.0000 g | ORAL_TABLET | Freq: Once | ORAL | Status: AC
  Administered 2021-07-27: 1 g via ORAL
  Filled 2021-07-27: qty 1

## 2021-07-27 NOTE — ED Provider Notes (Signed)
[facility] EMERGENCY DEPARTMENT Provider Note   CSN: [id number] Arrival date & time: 07/27/21  4128     History No chief complaint on file.   [name] is a 70 y.o. female.  70 year old female history of ESRD on hemodialysis, Monday Wednesday Fridays.  Last dialysis session Friday.  Received full session.  Dialysis access right upper extremity.  She reports over the past few days she has been having right-sided rib pain.  Described as aching, worse with walking or twisting her torso.  Thinks she may have run into a desk/table a few days ago which initially provoked her symptoms.  Patient also reports intermittent congestion, runny nose.  No fevers or chills.  No nausea or vomiting.  She is tolerant oral intake with difficulty.  Is asking for something to eat.  No abdominal pain.  No change to bowel function.  No numbness or tingling.  No neurologic changes reported.  The history is provided by the patient. No language interpreter was used.      Past Medical History:  Diagnosis Date   Adenomatous polyp of colon 10/2010, 2006, 2015   Anemia in CKD (chronic kidney disease) 11/07/2012   s/p blood transfusion.    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  Had some early rejection then stable function for years, then had slow decline of function and went back on hemodialysis in 2012.  Gets HD TTS schedule at [facility] on [facility] still using L forearm AVF.      GERD (gastroesophageal reflux disease)    GI bleed 2017   felt to be ischemic colitis, last colo 2015   Headache    Hyperlipidemia    Hypertension    Neurologic gait dysfunction    Neuromuscular disorder (HCC)    neuropathy hand and legs    Osteoporosis    Pneumonia    Pseudoaneurysm of surgical AV fistula ([place])    left upper arm   Pulmonary edema 12/2019   Stroke ([place]) 11/2015   TIA   Weight loss, unintentional     Patient Active Problem List   Diagnosis Date Noted   Major depressive disorder, recurrent episode, moderate ([place]) 02/23/2021   TIA (transient ischemic attack) 02/21/2021   Prolonged QT interval 02/21/2021   NSTEMI (non-ST elevated myocardial infarction) ([place]) 05/14/2020   Volume overload 05/13/2020   Hypotension after procedure 03/16/2020   Chest pain 03/16/2020   Change in mental state 03/15/2020   Ventricular tachycardia ([place]) 03/15/2020   Seizure ([place]) 03/15/2020   Unresponsive episode 03/15/2020   Pulmonary edema 78/67/6720   Complication of vascular access for dialysis 12/21/2019   Breakdown (mechanical) of surgically created arteriovenous fistula, initial encounter ([place]) 12/18/2019   Weakness 10/11/2019   Aneurysm artery, 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   Dependence on renal dialysis ([place]) 07/24/2019   Iron deficiency anemia, unspecified 06/09/2019   Age-related osteoporosis without current pathological fracture 04/17/2019   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   Coagulation defect, unspecified ([place]) 04/17/2019   Diarrhea, unspecified 04/17/2019   Encounter for screening for depression 04/17/2019   Headache, unspecified 04/17/2019   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   Pain, unspecified 04/17/2019  Polyp of colon 04/17/2019   Primary generalized (osteo)arthritis 04/17/2019   Pruritus, unspecified 04/17/2019   Pure hypercholesterolemia, unspecified 04/17/2019   Secondary hyperparathyroidism of renal origin ([place]) 04/17/2019   Gastro-esophageal reflux disease without esophagitis 04/17/2019   Essential (primary) hypertension 04/17/2019   Hyperlipidemia, unspecified 04/17/2019   Transient cerebral ischemic attack, unspecified 04/17/2019   Hypoxemia 12/13/2018    Atrial fibrillation (HCC) 03/23/2017   Prolonged Q-T interval on ECG 02/24/2017   Hypokalemia 02/24/2017   Acute ischemic stroke ([place]) 02/23/2017   Malnutrition of moderate degree 12/24/2016   Problem with dialysis access ([place]) 12/21/2016   Acute ischemic colitis ([place]) 05/16/2016   Colitis 05/15/2016   Rectal bleeding 05/15/2016   Neurologic abnormality 11/19/2015   Hyperkalemia 11/19/2015   Anxiety 11/19/2015   Insomnia 11/19/2015   Gait instability    Stroke-like symptom 11/16/2015   Vestibular neuritis 11/16/2015   Stroke (cerebrum) ([place]) 11/16/2015   Dizziness 05/09/2015   Ataxia 05/09/2015   H/O: CVA (cerebrovascular accident) 05/09/2015   Left facial numbness 05/09/2015   Left leg numbness 05/09/2015   Hyperlipidemia    Acute on chronic diastolic heart failure ([place])    Shortness of breath 04/01/2013   HTN (hypertension) 04/01/2013   Cholecystitis, acute 11/07/2012   End-stage renal disease on hemodialysis ([place]) 11/07/2012   Anemia in chronic kidney disease 11/07/2012    Past Surgical History:  Procedure Laterality Date   AV FISTULA PLACEMENT     for dialysis   AV FISTULA PLACEMENT Left 11/22/2015   Procedure: ARTERIOVENOUS (AV) FISTULA CREATION-LEFT BRACHIOCEPHALIC;  Surgeon: Serafina Mitchell, MD;  Location: [place];  Service: Vascular;  Laterality: Left;   AV FISTULA PLACEMENT Right 03/15/2020   Procedure: INSERTION OF ARTERIOVENOUS (AV) GORE-TEX GRAFT ARM ( BRACHIAL AXILLARY );  Surgeon: Katha Cabal, MD;  Location: [facility];  Service: Vascular;  Laterality: Right;   BACK SURGERY     CERVICAL FUSION     CHOLECYSTECTOMY  12/02/2012   Procedure: LAPAROSCOPIC CHOLECYSTECTOMY WITH INTRAOPERATIVE CHOLANGIOGRAM;  Surgeon: Edward Jolly, MD;  Location: [place];  Service: General;  Laterality: N/A;   EYE SURGERY Bilateral    cataract surgery   EYE SURGERY Left 2019   laser   HEMATOMA EVACUATION Left 12/24/2016   Procedure: EVACUATION HEMATOMA LEFT UPPER ARM;   Surgeon: Waynetta Sandy, MD;  Location: [place];  Service: Vascular;  Laterality: Left;   I & D EXTREMITY Left 12/31/2016   Procedure: IRRIGATION AND DEBRIDEMENT EXTREMITY;  Surgeon: Angelia Mould, MD;  Location: [place];  Service: Vascular;  Laterality: Left;   INSERTION OF DIALYSIS CATHETER Right 12/24/2016   Procedure: INSERTION OF DIALYSIS CATHETER;  Surgeon: Waynetta Sandy, MD;  Location: [place];  Service: Vascular;  Laterality: Right;   INSERTION OF DIALYSIS CATHETER Right 02/04/2017   Procedure: INSERTION OF DIALYSIS CATHETER;  Surgeon: Waynetta Sandy, MD;  Location: [facility];  Service: Vascular;  Laterality: Right;   KIDNEY TRANSPLANT  1996   PERIPHERAL VASCULAR CATHETERIZATION Left 10/23/2016   Procedure: Fistulagram;  Surgeon: Elam Dutch, MD;  Location: [place] CV LAB;  Service: Cardiovascular;  Laterality: Left;   PERIPHERAL VASCULAR THROMBECTOMY Right 04/16/2020   Procedure: PERIPHERAL VASCULAR THROMBECTOMY;  Surgeon: Katha Cabal, MD;  Location: [place] [facility];  Service: Cardiovascular;  Laterality: Right;   RESECTION OF ARTERIOVENOUS FISTULA ANEURYSM Left 11/22/2015   Procedure: RESECTION OF LEFT RADIOCEPHALIC FISTULA ANEURYSM ;  Surgeon: Serafina Mitchell, MD;  Location: [place];  Service:  Vascular;  Laterality: Left;   REVISON OF ARTERIOVENOUS FISTULA Left 12/22/2016   Procedure: REVISON OF LEFT ARTERIOVENOUS FISTULA;  Surgeon: Waynetta Sandy, MD;  Location: [place];  Service: Vascular;  Laterality: Left;   REVISON OF ARTERIOVENOUS FISTULA Left 02/04/2017   Procedure: REVISON OF LEFT UPPER ARM ARTERIOVENOUS FISTULA;  Surgeon: Waynetta Sandy, MD;  Location: [place];  Service: Vascular;  Laterality: Left;   UPPER EXTREMITY ANGIOGRAPHY Bilateral 09/19/2019   Procedure: UPPER EXTREMITY ANGIOGRAPHY;  Surgeon: Katha Cabal, MD;  Location: [place] CV LAB;  Service: Cardiovascular;  Laterality: Bilateral;     OB History    No obstetric history on file.     Family History  Problem Relation Age of Onset   Colon cancer Brother    Cancer Brother    Coronary artery disease Mother 29   Hyperlipidemia Mother    Hypertension Mother    Stroke Maternal Aunt    Esophageal cancer Neg Hx    Stomach cancer Neg Hx    Rectal cancer Neg Hx     Social History   Tobacco Use   Smoking status: Former    Types: Cigarettes    Quit date: 12/31/1991    Years since quitting: 29.5   Smokeless tobacco: Never  Vaping Use   Vaping Use: Never used  Substance Use Topics   Alcohol use: No    Alcohol/week: 0.0 standard drinks   Drug use: No    Home Medications Prior to Admission medications   Medication Sig Start Date End Date Taking? Authorizing Provider  sucralfate (CARAFATE) 1 g tablet Take 1 tablet (1 g total) by mouth 4 (four) times daily -  with meals and at bedtime. 07/27/21  Yes Jeanell Sparrow, DO  acetaminophen (TYLENOL) 500 MG tablet Take 1 tablet (500 mg total) by mouth every 6 (six) hours as needed. Patient taking differently: Take 500 mg by mouth every 6 (six) hours as needed for mild pain. 09/05/18   Law, Bea Graff, PA-C  allopurinol (ZYLOPRIM) 300 MG tablet Take 150 mg by mouth daily.     [provider]  ALPRAZolam Duanne Moron) 0.25 MG tablet Take 0.25 mg by mouth daily as needed for anxiety.     [provider]  aspirin EC 81 MG EC tablet Take 1 tablet (81 mg total) by mouth daily. 05/14/20   Black, Lezlie Octave, NP  atorvastatin (LIPITOR) 80 MG tablet TAKE 1 TABLET (80 MG TOTAL) BY MOUTH DAILY. Patient taking differently: Take 80 mg by mouth daily. 02/25/21 02/25/22  Dessa Phi, DO  AURYXIA 1 GM 210 MG(Fe) tablet Take 420 mg by mouth 3 (three) times daily with meals.     [provider]  busPIRone (BUSPAR) 10 MG tablet TAKE 1 TABLET (10 MG TOTAL) BY MOUTH TWO TIMES DAILY. Patient taking differently: Take 10 mg by mouth 2 (two) times daily. 02/25/21 02/25/22  Dessa Phi, DO  cinacalcet  (SENSIPAR) 30 MG tablet Take 30 mg by mouth daily. 01/04/20   [provider]  doxycycline (VIBRAMYCIN) 100 MG capsule Take 1 capsule (100 mg total) by mouth 2 (two) times daily. 05/18/21   Varney Biles, MD  lactulose (CHRONULAC) 10 GM/15ML solution Take 20 g by mouth daily as needed for mild constipation.    [provider]  losartan (COZAAR) 50 MG tablet Take 50 mg by mouth daily. 09/25/20   [provider]  ondansetron (ZOFRAN) 8 MG tablet Take 8 mg by mouth 2 (two) times daily as  needed for nausea or vomiting.     [provider]  zolpidem (AMBIEN) 10 MG tablet Take 10 mg by mouth at bedtime as needed for sleep. 02/13/21   [provider]    Allergies    Sulfa antibiotics and Adhesive [tape]  Review of Systems   Review of Systems  Constitutional:  Negative for chills and fever.  HENT:  Positive for congestion. Negative for facial swelling and trouble swallowing.   Eyes:  Negative for photophobia and visual disturbance.  Respiratory:  Negative for cough and shortness of breath.   Cardiovascular:  Positive for chest pain. Negative for palpitations.  Gastrointestinal:  Negative for abdominal pain, nausea and vomiting.  Endocrine: Negative for polydipsia and polyuria.  Genitourinary:  Negative for difficulty urinating and hematuria.  Musculoskeletal:  Negative for gait problem and joint swelling.  Skin:  Negative for pallor and rash.  Neurological:  Negative for syncope and headaches.  Psychiatric/Behavioral:  Negative for agitation and confusion.    Physical Exam Updated Vital Signs BP (!) 138/50 (BP Location: Other (Comment))   Pulse 68   Temp 98.3 F (36.8 C) (Oral)   Resp 16   SpO2 100%   Physical Exam Vitals and nursing note reviewed.  Constitutional:      General: She is not in acute distress.    Appearance: Normal appearance.  HENT:     Head: Normocephalic and atraumatic.     Right Ear: External ear normal.     Left Ear:  External ear normal.     Nose: Nose normal.     Mouth/Throat:     Mouth: Mucous membranes are moist.  Eyes:     General: No scleral icterus.       Right eye: No discharge.        Left eye: No discharge.  Cardiovascular:     Rate and Rhythm: Normal rate and regular rhythm.     Pulses: Normal pulses.     Heart sounds: Normal heart sounds.  Pulmonary:     Effort: Pulmonary effort is normal. No respiratory distress.     Breath sounds: Normal breath sounds.  Chest:    Abdominal:     General: Abdomen is flat.     Tenderness: There is no abdominal tenderness.  Musculoskeletal:        General: Normal range of motion.       Arms:     Cervical back: Normal range of motion.     Right lower leg: No edema.     Left lower leg: No edema.  Skin:    General: Skin is warm and dry.     Capillary Refill: Capillary refill takes less than 2 seconds.  Neurological:     Mental Status: She is alert.  Psychiatric:        Mood and Affect: Mood normal.        Behavior: Behavior normal.    ED Results / Procedures / Treatments   Labs (all labs ordered are listed, but only abnormal results are displayed) Labs Reviewed  CBC WITH DIFFERENTIAL/PLATELET - Abnormal; Notable for the following components:      Result Value   RDW 15.7 (*)    All other components within normal limits  COMPREHENSIVE METABOLIC PANEL - Abnormal; Notable for the following components:   Chloride 94 (*)    BUN 37 (*)    Creatinine, Ser 6.02 (*)    Albumin 3.4 (*)    Alkaline Phosphatase 165 (*)  GFR, Estimated 7 (*)    All other components within normal limits  TROPONIN I (HIGH SENSITIVITY) - Abnormal; Notable for the following components:   Troponin I (High Sensitivity) 24 (*)    All other components within normal limits  TROPONIN I (HIGH SENSITIVITY) - Abnormal; Notable for the following components:   Troponin I (High Sensitivity) 26 (*)    All other components within normal limits  RESP PANEL BY RT-PCR (FLU A&B,  COVID) ARPGX2    EKG EKG Interpretation  Date/Time:  Sunday July 27 2021 16:28:49 EDT Ventricular Rate:  76 PR Interval:  180 QRS Duration: 78 QT Interval:  420 QTC Calculation: 472 R Axis:   86 Text Interpretation: Normal sinus rhythm Similar to prior tracing Confirmed by ,  ([id number]) on 07/27/2021 5:37:03 PM  Radiology DG Ribs Unilateral W/Chest Right  Result Date: 07/27/2021 CLINICAL DATA:  Rib pain. EXAM: RIGHT RIBS AND CHEST - 3+ VIEW COMPARISON:  July 07, 2021 FINDINGS: No pneumothorax. Stable cardiomegaly. The hila and mediastinum are unchanged. No acute infiltrate. Kerley B lines and mild prominence of the interstitium identified. No rib fractures are noted. IMPRESSION: 1. No rib fractures or pneumothorax. 2. Cardiomegaly and mild pulmonary edema. 3. No other changes. Electronically Signed   By: David  Williams III M.D.   On: 07/27/2021 10:00    Procedures Procedures   Medications Ordered in ED Medications  sucralfate (CARAFATE) tablet 1 g (1 g Oral Given 07/27/21 1637)    ED Course  I have reviewed the triage vital signs and the nursing notes.  Pertinent labs & imaging results that were available during my care of the patient were reviewed by me and considered in my medical decision making (see chart for details).    MDM Rules/Calculators/A&P                          69  year old female with concern for right-sided chest wall pain.  Nasal congestion.  Patient is well-appearing overall.  Vital signs reviewed and are stable.  Physical exam is reassuring.  Soft, nontender, nonperitoneal.  There is chest wall tenderness to palpation on the right side.  Serious etiology considered  ECG reviewed does not demonstrate evidence of acute ischemia.  No STEMI.  Troponin collected and is chronically elevated given ESRD.  No chest pain.Trop is lower than her typical baseline.  Labs otherwise are reassuring.  Potassium is stable.  X-ray is nonacute, no rib fractures or  pneumothorax.  Mild pulmonary edema, cardiomegaly.  No dyspnea.  No cough.   Patient requesting something to eat.  She was given food and reports that she is feeling much better.  Also given Carafate.  She reports that she was having some acid reflux after eating a [place] sandwich.  Patient with likely right-sided chest wall contusion secondary to trauma.  Imaging stable.  She is currently asymptomatic.  Requesting to go home at this time. Her COVID19 test is still pending at this time. Will callback if positive.   The patient improved significantly and was discharged in stable condition. Detailed discussions were had with the patient regarding current findings, and need for close f/u with PCP or on call doctor. The patient has been instructed to return immediately if the symptoms worsen in any way for re-evaluation. Patient verbalized understanding and is in agreement with current care plan. All questions answered prior to discharge.    Final Clinical Impression(s) / ED Diagnoses Final diagnoses:  Contusion of right  chest wall, initial encounter  Indigestion    Rx / DC Orders ED Discharge Orders          Ordered    sucralfate (CARAFATE) 1 g tablet  3 times daily with meals & bedtime        07/27/21 1732             Jeanell Sparrow, DO 07/27/21 1737

## 2021-07-27 NOTE — ED Triage Notes (Signed)
Patient arrived by [facility] with complaint of right rib pain x 2 days. States that her pain is worse with movement and inspiration. Last dialysis Friday and due tomorrow. Patient alert and oriented, denies trauma

## 2021-07-27 NOTE — ED Notes (Signed)
Lab called and requested CMP order to be put back in.  States they didn't have enough blood with first draw and just got more blood in lab and needed order placed.

## 2021-07-27 NOTE — ED Provider Notes (Signed)
Emergency Medicine Provider Triage Evaluation Note  [name] , a 70 y.o. female  was evaluated in triage.  Pt complains of gradual onset, constant, sharp, right rib pain for the past 2-3 days. Pt initially denies falling however states she may have at some point fallen out of the bed. She states she actually called EMS today however because she felt short of breath and like she needed oxygen. She last dialyzed Friday, has not missed any. She also mentions having a headache yesterday. None currently.   Review of Systems  Positive: + right rib pain/chest pain, SOB, headache Negative: - fevers, cough, nausea, vomiting  Physical Exam  BP (!) 192/111 (BP Location: Right Arm)   Pulse 74   Temp 97.9 F (36.6 C)   Resp 18   SpO2 98%  Gen:   Awake, no distress   Resp:  Normal effort  MSK:   Moves extremities without difficulty  Other:  Very mild right rib TTP  Medical Decision Making  Medically screening exam initiated at 9:07 AM.  Appropriate orders placed.  NECIA KAMM was informed that the remainder of the evaluation will be completed by another provider, this initial triage assessment does not replace that evaluation, and the importance of remaining in the ED until their evaluation is complete.  Labs and xray of R ribs obtained. Pt unsure if she fell or not.    Eustaquio Maize, PA-C 07/27/21 3845    Margette Fast, MD 07/27/21 [phone]

## 2021-07-27 NOTE — ED Notes (Signed)
Pt called sister, [name], to come pick her up. Sister on the way

## 2021-07-28 DIAGNOSIS — Z992 Dependence on renal dialysis: Secondary | ICD-10-CM | POA: Diagnosis not present

## 2021-07-28 DIAGNOSIS — N2581 Secondary hyperparathyroidism of renal origin: Secondary | ICD-10-CM | POA: Diagnosis not present

## 2021-07-28 DIAGNOSIS — N186 End stage renal disease: Secondary | ICD-10-CM | POA: Diagnosis not present

## 2021-07-30 DIAGNOSIS — N2581 Secondary hyperparathyroidism of renal origin: Secondary | ICD-10-CM | POA: Diagnosis not present

## 2021-07-30 DIAGNOSIS — Z992 Dependence on renal dialysis: Secondary | ICD-10-CM | POA: Diagnosis not present

## 2021-07-30 DIAGNOSIS — N186 End stage renal disease: Secondary | ICD-10-CM | POA: Diagnosis not present

## 2021-08-01 DIAGNOSIS — Z992 Dependence on renal dialysis: Secondary | ICD-10-CM | POA: Diagnosis not present

## 2021-08-01 DIAGNOSIS — N2581 Secondary hyperparathyroidism of renal origin: Secondary | ICD-10-CM | POA: Diagnosis not present

## 2021-08-01 DIAGNOSIS — N186 End stage renal disease: Secondary | ICD-10-CM | POA: Diagnosis not present

## 2021-08-04 DIAGNOSIS — N2581 Secondary hyperparathyroidism of renal origin: Secondary | ICD-10-CM | POA: Diagnosis not present

## 2021-08-04 DIAGNOSIS — Z992 Dependence on renal dialysis: Secondary | ICD-10-CM | POA: Diagnosis not present

## 2021-08-04 DIAGNOSIS — N186 End stage renal disease: Secondary | ICD-10-CM | POA: Diagnosis not present

## 2021-08-06 ENCOUNTER — Emergency Department (HOSPITAL_COMMUNITY): Payer: Medicare HMO

## 2021-08-06 ENCOUNTER — Other Ambulatory Visit: Payer: Self-pay

## 2021-08-06 ENCOUNTER — Encounter (HOSPITAL_COMMUNITY): Payer: Self-pay | Admitting: *Deleted

## 2021-08-06 ENCOUNTER — Inpatient Hospital Stay (HOSPITAL_COMMUNITY)
Admission: EM | Admit: 2021-08-06 | Discharge: 2021-08-21 | DRG: 391 | Disposition: A | Discharge status: Skilled Nursing Facility | Payer: Medicare HMO | Attending: Internal Medicine | Admitting: Internal Medicine

## 2021-08-06 DIAGNOSIS — Z823 Family history of stroke: Secondary | ICD-10-CM

## 2021-08-06 DIAGNOSIS — D631 Anemia in chronic kidney disease: Secondary | ICD-10-CM | POA: Diagnosis present

## 2021-08-06 DIAGNOSIS — J9601 Acute respiratory failure with hypoxia: Secondary | ICD-10-CM | POA: Diagnosis present

## 2021-08-06 DIAGNOSIS — E162 Hypoglycemia, unspecified: Secondary | ICD-10-CM | POA: Diagnosis not present

## 2021-08-06 DIAGNOSIS — K5740 Diverticulitis of both small and large intestine with perforation and abscess without bleeding: Secondary | ICD-10-CM | POA: Diagnosis not present

## 2021-08-06 DIAGNOSIS — R1084 Generalized abdominal pain: Secondary | ICD-10-CM | POA: Diagnosis not present

## 2021-08-06 DIAGNOSIS — I722 Aneurysm of renal artery: Secondary | ICD-10-CM | POA: Diagnosis not present

## 2021-08-06 DIAGNOSIS — K5720 Diverticulitis of large intestine with perforation and abscess without bleeding: Secondary | ICD-10-CM | POA: Diagnosis not present

## 2021-08-06 DIAGNOSIS — R109 Unspecified abdominal pain: Secondary | ICD-10-CM | POA: Diagnosis not present

## 2021-08-06 DIAGNOSIS — M6281 Muscle weakness (generalized): Secondary | ICD-10-CM | POA: Diagnosis not present

## 2021-08-06 DIAGNOSIS — I2510 Atherosclerotic heart disease of native coronary artery without angina pectoris: Secondary | ICD-10-CM | POA: Diagnosis present

## 2021-08-06 DIAGNOSIS — Z992 Dependence on renal dialysis: Secondary | ICD-10-CM | POA: Diagnosis not present

## 2021-08-06 DIAGNOSIS — I69354 Hemiplegia and hemiparesis following cerebral infarction affecting left non-dominant side: Secondary | ICD-10-CM | POA: Diagnosis not present

## 2021-08-06 DIAGNOSIS — Z7952 Long term (current) use of systemic steroids: Secondary | ICD-10-CM

## 2021-08-06 DIAGNOSIS — M810 Age-related osteoporosis without current pathological fracture: Secondary | ICD-10-CM | POA: Diagnosis present

## 2021-08-06 DIAGNOSIS — I69392 Facial weakness following cerebral infarction: Secondary | ICD-10-CM

## 2021-08-06 DIAGNOSIS — N186 End stage renal disease: Secondary | ICD-10-CM | POA: Diagnosis present

## 2021-08-06 DIAGNOSIS — Z888 Allergy status to other drugs, medicaments and biological substances status: Secondary | ICD-10-CM

## 2021-08-06 DIAGNOSIS — N2581 Secondary hyperparathyroidism of renal origin: Secondary | ICD-10-CM | POA: Diagnosis present

## 2021-08-06 DIAGNOSIS — I700 Atherosclerosis of aorta: Secondary | ICD-10-CM | POA: Diagnosis not present

## 2021-08-06 DIAGNOSIS — Z8249 Family history of ischemic heart disease and other diseases of the circulatory system: Secondary | ICD-10-CM

## 2021-08-06 DIAGNOSIS — R0902 Hypoxemia: Secondary | ICD-10-CM | POA: Diagnosis not present

## 2021-08-06 DIAGNOSIS — K659 Peritonitis, unspecified: Secondary | ICD-10-CM | POA: Diagnosis not present

## 2021-08-06 DIAGNOSIS — F419 Anxiety disorder, unspecified: Secondary | ICD-10-CM | POA: Diagnosis present

## 2021-08-06 DIAGNOSIS — E876 Hypokalemia: Secondary | ICD-10-CM | POA: Diagnosis not present

## 2021-08-06 DIAGNOSIS — I132 Hypertensive heart and chronic kidney disease with heart failure and with stage 5 chronic kidney disease, or end stage renal disease: Secondary | ICD-10-CM | POA: Diagnosis present

## 2021-08-06 DIAGNOSIS — I5032 Chronic diastolic (congestive) heart failure: Secondary | ICD-10-CM | POA: Diagnosis present

## 2021-08-06 DIAGNOSIS — R110 Nausea: Secondary | ICD-10-CM

## 2021-08-06 DIAGNOSIS — I10 Essential (primary) hypertension: Secondary | ICD-10-CM | POA: Diagnosis not present

## 2021-08-06 DIAGNOSIS — I252 Old myocardial infarction: Secondary | ICD-10-CM

## 2021-08-06 DIAGNOSIS — K5900 Constipation, unspecified: Secondary | ICD-10-CM | POA: Diagnosis not present

## 2021-08-06 DIAGNOSIS — K219 Gastro-esophageal reflux disease without esophagitis: Secondary | ICD-10-CM | POA: Diagnosis present

## 2021-08-06 DIAGNOSIS — I480 Paroxysmal atrial fibrillation: Secondary | ICD-10-CM | POA: Diagnosis not present

## 2021-08-06 DIAGNOSIS — J810 Acute pulmonary edema: Secondary | ICD-10-CM | POA: Diagnosis not present

## 2021-08-06 DIAGNOSIS — I4891 Unspecified atrial fibrillation: Secondary | ICD-10-CM | POA: Diagnosis present

## 2021-08-06 DIAGNOSIS — R609 Edema, unspecified: Secondary | ICD-10-CM | POA: Diagnosis not present

## 2021-08-06 DIAGNOSIS — K5792 Diverticulitis of intestine, part unspecified, without perforation or abscess without bleeding: Secondary | ICD-10-CM

## 2021-08-06 DIAGNOSIS — Z8673 Personal history of transient ischemic attack (TIA), and cerebral infarction without residual deficits: Secondary | ICD-10-CM

## 2021-08-06 DIAGNOSIS — E871 Hypo-osmolality and hyponatremia: Secondary | ICD-10-CM | POA: Diagnosis not present

## 2021-08-06 DIAGNOSIS — E43 Unspecified severe protein-calorie malnutrition: Secondary | ICD-10-CM | POA: Diagnosis not present

## 2021-08-06 DIAGNOSIS — N281 Cyst of kidney, acquired: Secondary | ICD-10-CM | POA: Diagnosis not present

## 2021-08-06 DIAGNOSIS — Z79899 Other long term (current) drug therapy: Secondary | ICD-10-CM

## 2021-08-06 DIAGNOSIS — D649 Anemia, unspecified: Secondary | ICD-10-CM | POA: Diagnosis not present

## 2021-08-06 DIAGNOSIS — Z800 Family history of malignant neoplasm of digestive organs: Secondary | ICD-10-CM

## 2021-08-06 DIAGNOSIS — I120 Hypertensive chronic kidney disease with stage 5 chronic kidney disease or end stage renal disease: Secondary | ICD-10-CM | POA: Diagnosis not present

## 2021-08-06 DIAGNOSIS — E785 Hyperlipidemia, unspecified: Secondary | ICD-10-CM | POA: Diagnosis present

## 2021-08-06 DIAGNOSIS — Z23 Encounter for immunization: Secondary | ICD-10-CM

## 2021-08-06 DIAGNOSIS — I517 Cardiomegaly: Secondary | ICD-10-CM | POA: Diagnosis not present

## 2021-08-06 DIAGNOSIS — Z7401 Bed confinement status: Secondary | ICD-10-CM | POA: Diagnosis not present

## 2021-08-06 DIAGNOSIS — R531 Weakness: Secondary | ICD-10-CM | POA: Diagnosis not present

## 2021-08-06 DIAGNOSIS — Z20822 Contact with and (suspected) exposure to covid-19: Secondary | ICD-10-CM | POA: Diagnosis present

## 2021-08-06 DIAGNOSIS — R197 Diarrhea, unspecified: Secondary | ICD-10-CM

## 2021-08-06 DIAGNOSIS — T8612 Kidney transplant failure: Secondary | ICD-10-CM | POA: Diagnosis not present

## 2021-08-06 DIAGNOSIS — F32A Depression, unspecified: Secondary | ICD-10-CM | POA: Diagnosis present

## 2021-08-06 DIAGNOSIS — E7800 Pure hypercholesterolemia, unspecified: Secondary | ICD-10-CM

## 2021-08-06 DIAGNOSIS — R262 Difficulty in walking, not elsewhere classified: Secondary | ICD-10-CM | POA: Diagnosis not present

## 2021-08-06 DIAGNOSIS — Z882 Allergy status to sulfonamides status: Secondary | ICD-10-CM

## 2021-08-06 DIAGNOSIS — J9811 Atelectasis: Secondary | ICD-10-CM | POA: Diagnosis not present

## 2021-08-06 DIAGNOSIS — J90 Pleural effusion, not elsewhere classified: Secondary | ICD-10-CM | POA: Diagnosis not present

## 2021-08-06 DIAGNOSIS — M898X9 Other specified disorders of bone, unspecified site: Secondary | ICD-10-CM | POA: Diagnosis present

## 2021-08-06 DIAGNOSIS — E875 Hyperkalemia: Secondary | ICD-10-CM | POA: Diagnosis not present

## 2021-08-06 DIAGNOSIS — J811 Chronic pulmonary edema: Secondary | ICD-10-CM | POA: Diagnosis not present

## 2021-08-06 DIAGNOSIS — Z87891 Personal history of nicotine dependence: Secondary | ICD-10-CM

## 2021-08-06 DIAGNOSIS — Z7982 Long term (current) use of aspirin: Secondary | ICD-10-CM

## 2021-08-06 DIAGNOSIS — N261 Atrophy of kidney (terminal): Secondary | ICD-10-CM | POA: Diagnosis not present

## 2021-08-06 HISTORY — DX: Diverticulitis of large intestine with perforation and abscess without bleeding: K57.20

## 2021-08-06 LAB — CBC WITH DIFFERENTIAL/PLATELET
Abs Immature Granulocytes: 0.01 10*3/uL (ref 0.00–0.07)
Basophils Absolute: 0 10*3/uL (ref 0.0–0.1)
Basophils Relative: 0 %
Eosinophils Absolute: 0.2 10*3/uL (ref 0.0–0.5)
Eosinophils Relative: 3 %
HCT: 40 % (ref 36.0–46.0)
Hemoglobin: 12.5 g/dL (ref 12.0–15.0)
Immature Granulocytes: 0 %
Lymphocytes Relative: 26 %
Lymphs Abs: 1.3 10*3/uL (ref 0.7–4.0)
MCH: 30.3 pg (ref 26.0–34.0)
MCHC: 31.3 g/dL (ref 30.0–36.0)
MCV: 97.1 fL (ref 80.0–100.0)
Monocytes Absolute: 0.5 10*3/uL (ref 0.1–1.0)
Monocytes Relative: 9 %
Neutro Abs: 3 10*3/uL (ref 1.7–7.7)
Neutrophils Relative %: 62 %
Platelets: 212 10*3/uL (ref 150–400)
RBC: 4.12 MIL/uL (ref 3.87–5.11)
RDW: 15.7 % — ABNORMAL HIGH (ref 11.5–15.5)
WBC: 4.9 10*3/uL (ref 4.0–10.5)
nRBC: 0 % (ref 0.0–0.2)

## 2021-08-06 LAB — COMPREHENSIVE METABOLIC PANEL
ALT: 11 U/L (ref 0–44)
AST: 20 U/L (ref 15–41)
Albumin: 3.1 g/dL — ABNORMAL LOW (ref 3.5–5.0)
Alkaline Phosphatase: 149 U/L — ABNORMAL HIGH (ref 38–126)
Anion gap: 17 — ABNORMAL HIGH (ref 5–15)
BUN: 36 mg/dL — ABNORMAL HIGH (ref 8–23)
CO2: 27 mmol/L (ref 22–32)
Calcium: 8.3 mg/dL — ABNORMAL LOW (ref 8.9–10.3)
Chloride: 94 mmol/L — ABNORMAL LOW (ref 98–111)
Creatinine, Ser: 6.23 mg/dL — ABNORMAL HIGH (ref 0.44–1.00)
GFR, Estimated: 7 mL/min — ABNORMAL LOW (ref >60)
Glucose, Bld: 90 mg/dL (ref 70–99)
Potassium: 4.3 mmol/L (ref 3.5–5.1)
Sodium: 138 mmol/L (ref 135–145)
Total Bilirubin: 1.1 mg/dL (ref 0.3–1.2)
Total Protein: 6.9 g/dL (ref 6.5–8.1)

## 2021-08-06 LAB — HIV ANTIBODY (ROUTINE TESTING W REFLEX): HIV Screen 4th Generation wRfx: NONREACTIVE

## 2021-08-06 LAB — LIPASE, BLOOD: Lipase: 40 U/L (ref 11–51)

## 2021-08-06 LAB — RESP PANEL BY RT-PCR (FLU A&B, COVID) ARPGX2
Influenza A by PCR: NEGATIVE
Influenza B by PCR: NEGATIVE
SARS Coronavirus 2 by RT PCR: NEGATIVE

## 2021-08-06 MED ORDER — ONDANSETRON HCL 4 MG PO TABS
4.0000 mg | ORAL_TABLET | Freq: Four times a day (QID) | ORAL | Status: DC | PRN
  Administered 2021-08-16 – 2021-08-18 (×2): 4 mg via ORAL
  Filled 2021-08-06 (×2): qty 1

## 2021-08-06 MED ORDER — ALBUTEROL SULFATE (2.5 MG/3ML) 0.083% IN NEBU: 2.5000 mg | INHALATION_SOLUTION | Freq: Four times a day (QID) | RESPIRATORY_TRACT | Status: DC | PRN

## 2021-08-06 MED ORDER — IOHEXOL 350 MG/ML SOLN
100.0000 mL | Freq: Once | INTRAVENOUS | Status: AC | PRN
  Administered 2021-08-06: 100 mL via INTRAVENOUS

## 2021-08-06 MED ORDER — SODIUM CHLORIDE 0.9 % IV SOLN: INTRAVENOUS | Status: DC

## 2021-08-06 MED ORDER — ACETAMINOPHEN 650 MG RE SUPP
650.0000 mg | Freq: Four times a day (QID) | RECTAL | Status: DC | PRN
  Filled 2021-08-06: qty 1

## 2021-08-06 MED ORDER — POLYVINYL ALCOHOL 1.4 % OP SOLN
1.0000 [drp] | Freq: Three times a day (TID) | OPHTHALMIC | Status: DC | PRN
  Filled 2021-08-06: qty 15

## 2021-08-06 MED ORDER — PANTOPRAZOLE SODIUM 40 MG IV SOLR
40.0000 mg | Freq: Once | INTRAVENOUS | Status: AC
  Administered 2021-08-06: 40 mg via INTRAVENOUS
  Filled 2021-08-06: qty 40

## 2021-08-06 MED ORDER — ASPIRIN EC 81 MG PO TBEC
81.0000 mg | DELAYED_RELEASE_TABLET | Freq: Every day | ORAL | Status: DC
  Administered 2021-08-07 – 2021-08-13 (×7): 81 mg via ORAL
  Filled 2021-08-06 (×7): qty 1

## 2021-08-06 MED ORDER — SODIUM CHLORIDE 0.9% FLUSH
3.0000 mL | Freq: Two times a day (BID) | INTRAVENOUS | Status: DC
  Administered 2021-08-06 – 2021-08-18 (×20): 3 mL via INTRAVENOUS

## 2021-08-06 MED ORDER — PANTOPRAZOLE SODIUM 40 MG IV SOLR: 40.0000 mg | INTRAVENOUS | Status: DC

## 2021-08-06 MED ORDER — ZOLPIDEM TARTRATE 5 MG PO TABS
5.0000 mg | ORAL_TABLET | Freq: Every day | ORAL | Status: DC
  Administered 2021-08-06 – 2021-08-20 (×15): 5 mg via ORAL
  Filled 2021-08-06 (×15): qty 1

## 2021-08-06 MED ORDER — PIPERACILLIN-TAZOBACTAM 3.375 G IVPB 30 MIN: 3.3750 g | Freq: Three times a day (TID) | INTRAVENOUS | Status: DC

## 2021-08-06 MED ORDER — PIPERACILLIN-TAZOBACTAM 3.375 G IVPB 30 MIN
3.3750 g | Freq: Once | INTRAVENOUS | Status: AC
  Administered 2021-08-06: 3.375 g via INTRAVENOUS
  Filled 2021-08-06: qty 50

## 2021-08-06 MED ORDER — MORPHINE SULFATE (PF) 2 MG/ML IV SOLN
2.0000 mg | INTRAVENOUS | Status: DC | PRN
  Administered 2021-08-06 – 2021-08-10 (×2): 2 mg via INTRAVENOUS
  Filled 2021-08-06 (×2): qty 1

## 2021-08-06 MED ORDER — HEPARIN SODIUM (PORCINE) 5000 UNIT/ML IJ SOLN
5000.0000 [IU] | Freq: Three times a day (TID) | INTRAMUSCULAR | Status: DC
  Administered 2021-08-06 – 2021-08-20 (×42): 5000 [IU] via SUBCUTANEOUS
  Filled 2021-08-06 (×43): qty 1

## 2021-08-06 MED ORDER — ALPRAZOLAM 0.25 MG PO TABS
0.2500 mg | ORAL_TABLET | Freq: Every day | ORAL | Status: DC | PRN
  Administered 2021-08-16 – 2021-08-21 (×4): 0.25 mg via ORAL
  Filled 2021-08-06 (×4): qty 1

## 2021-08-06 MED ORDER — ONDANSETRON HCL 4 MG/2ML IJ SOLN
4.0000 mg | Freq: Once | INTRAMUSCULAR | Status: AC
  Administered 2021-08-06: 4 mg via INTRAVENOUS
  Filled 2021-08-06: qty 2

## 2021-08-06 MED ORDER — PANTOPRAZOLE SODIUM 40 MG PO TBEC
40.0000 mg | DELAYED_RELEASE_TABLET | Freq: Every day | ORAL | Status: DC
  Administered 2021-08-07 – 2021-08-20 (×14): 40 mg via ORAL
  Filled 2021-08-06 (×14): qty 1

## 2021-08-06 MED ORDER — BUSPIRONE HCL 5 MG PO TABS: 10.0000 mg | ORAL_TABLET | Freq: Two times a day (BID) | ORAL | Status: DC | PRN

## 2021-08-06 MED ORDER — CINACALCET HCL 30 MG PO TABS
30.0000 mg | ORAL_TABLET | Freq: Every day | ORAL | Status: DC
  Administered 2021-08-08 – 2021-08-14 (×4): 30 mg via ORAL
  Filled 2021-08-06 (×10): qty 1

## 2021-08-06 MED ORDER — PIPERACILLIN-TAZOBACTAM IN DEX 2-0.25 GM/50ML IV SOLN
2.2500 g | Freq: Three times a day (TID) | INTRAVENOUS | Status: DC
  Administered 2021-08-06 – 2021-08-15 (×26): 2.25 g via INTRAVENOUS
  Filled 2021-08-06 (×30): qty 50

## 2021-08-06 MED ORDER — ACETAMINOPHEN 325 MG PO TABS
650.0000 mg | ORAL_TABLET | Freq: Four times a day (QID) | ORAL | Status: DC | PRN
  Administered 2021-08-06 – 2021-08-20 (×10): 650 mg via ORAL
  Filled 2021-08-06 (×10): qty 2

## 2021-08-06 MED ORDER — MORPHINE SULFATE (PF) 2 MG/ML IV SOLN
2.0000 mg | Freq: Once | INTRAVENOUS | Status: AC
  Administered 2021-08-06: 2 mg via INTRAVENOUS
  Filled 2021-08-06: qty 1

## 2021-08-06 MED ORDER — ATORVASTATIN CALCIUM 80 MG PO TABS
80.0000 mg | ORAL_TABLET | Freq: Every day | ORAL | Status: DC
  Administered 2021-08-07 – 2021-08-13 (×7): 80 mg via ORAL
  Filled 2021-08-06 (×7): qty 1

## 2021-08-06 MED ORDER — ONDANSETRON HCL 4 MG/2ML IJ SOLN
4.0000 mg | Freq: Four times a day (QID) | INTRAMUSCULAR | Status: DC | PRN
  Administered 2021-08-12: 4 mg via INTRAVENOUS
  Filled 2021-08-06 (×2): qty 2

## 2021-08-06 MED ORDER — ACETAMINOPHEN 500 MG PO TABS: 1000.0000 mg | ORAL_TABLET | Freq: Once | ORAL | Status: DC

## 2021-08-06 NOTE — ED Notes (Signed)
Per NT Sharyn Lull, patient was hypoxic in lobby before coming to room at 88% on RA. Patient was placed on 2 L O2 [place] and Spo2 improved to 95%.

## 2021-08-06 NOTE — ED Provider Notes (Signed)
Emergency Medicine Provider Triage Evaluation Note  [name] , a 70 y.o. female  was evaluated in triage.  Pt complains of abdominal pain that began this morning.  Also endorsing some nausea but has not any episodes of vomiting.  Last dialysis treatment was Monday, she is scheduled to have this at noon today, however felt too unwell to go.  Prior history of kidney transplant, does report swelling to her left abdomen is at baseline.  Last saw transplant physician on Monday.  Review of Systems  Positive: Abdominal pain, nausea Negative: Fever, vomiting, diarrhea  Physical Exam  There were no vitals taken for this visit. Gen:   Chronically ill appearing  Resp:  Normal effort  MSK:   Moves extremities without difficulty  Other:    Medical Decision Making  Medically screening exam initiated at 9:47 AM.  Appropriate orders placed.  RINI MOFFIT was informed that the remainder of the evaluation will be completed by another provider, this initial triage assessment does not replace that evaluation, and the importance of remaining in the ED until their evaluation is complete.  ESRD patient on Monday Wednesday Friday sessions, scheduled to be dialyzed today at noon presents here with abdominal pain that began this morning along with some nausea.  No vomiting, no fevers, no diarrhea.  Prior history of GERD on her chart, last encounter here on 14 August for right rib pain.  Prior history of QT prolongation, will hold off on antiemetic at this time as she is not actively vomiting.  Labs have been ordered.   Janeece Fitting, PA-C 08/06/21 7425    Daleen Bo, MD 08/06/21 [phone]

## 2021-08-06 NOTE — ED Provider Notes (Signed)
[facility] EMERGENCY DEPARTMENT Provider Note   CSN: [id number] Arrival date & time: 08/06/21  0240     History Chief Complaint  Patient presents with   Abdominal Pain    [name] is a 70 y.o. female.   Abdominal Pain Associated symptoms: diarrhea and nausea   Associated symptoms: no cough, no shortness of breath and no vomiting    Patient ihas a history of ESRD, CHF, and GERD coming in with abdominal pain. Patient said she felt nauseous and tried to make herself throw up but was unable to do so. This started last night. The pain in her belly is generalized and comes and goes. She has not eaten anything today because of the pain and she missed dialysis which she was supposed to have at 12 because she was in too much pain. She denies any bloody or black tarry stools. Endorsing some diarrhea that also started today. No shortness of breath, cough, or other respiratory symptoms. Has felt weak but is unsure if she has been feverish. Nobody else around her has been sick and nothing out of the ordinary happened before she started to feel ill.     Past Medical History:  Diagnosis Date   Adenomatous polyp of colon 10/2010, 2006, 2015   Anemia in CKD (chronic kidney disease) 11/07/2012   s/p blood transfusion.    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  Had some early rejection then stable function for years, then had slow decline of function and went back on hemodialysis in 2012.  Gets HD TTS schedule at [facility] on [facility] still using L forearm AVF.      GERD (gastroesophageal reflux disease)    GI bleed 2017   felt to be ischemic colitis, last colo 2015   Headache    Hyperlipidemia     Hypertension    Neurologic gait dysfunction    Neuromuscular disorder (HCC)    neuropathy hand and legs   Osteoporosis    Pneumonia    Pseudoaneurysm of surgical AV fistula ([place])    left upper arm   Pulmonary edema 12/2019   Stroke ([place]) 11/2015   TIA   Weight loss, unintentional     Patient Active Problem List   Diagnosis Date Noted   Major depressive disorder, recurrent episode, moderate ([place]) 02/23/2021   TIA (transient ischemic attack) 02/21/2021   Prolonged QT interval 02/21/2021   NSTEMI (non-ST elevated myocardial infarction) ([place]) 05/14/2020   Volume overload 05/13/2020   Hypotension after procedure 03/16/2020   Chest pain 03/16/2020   Change in mental state 03/15/2020   Ventricular tachycardia ([place]) 03/15/2020   Seizure ([place]) 03/15/2020   Unresponsive episode 03/15/2020   Pulmonary edema 97/35/3299   Complication of vascular access for dialysis 12/21/2019   Breakdown (mechanical) of surgically created arteriovenous fistula, initial encounter ([place]) 12/18/2019   Weakness 10/11/2019   Aneurysm artery, 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   Dependence on renal dialysis ([place]) 07/24/2019   Iron deficiency anemia, unspecified 06/09/2019   Age-related osteoporosis without current pathological fracture 04/17/2019   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   Coagulation defect, unspecified ([place]) 04/17/2019   Diarrhea, unspecified 04/17/2019  Encounter for screening for depression 04/17/2019   Headache, unspecified 04/17/2019   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   Pain, unspecified 04/17/2019   Polyp of colon 04/17/2019   Primary generalized (osteo)arthritis 04/17/2019   Pruritus, unspecified 04/17/2019   Pure hypercholesterolemia, unspecified 04/17/2019   Secondary hyperparathyroidism of renal origin ([place]) 04/17/2019   Gastro-esophageal reflux disease without esophagitis 04/17/2019   Essential (primary) hypertension 04/17/2019   Hyperlipidemia,  unspecified 04/17/2019   Transient cerebral ischemic attack, unspecified 04/17/2019   Hypoxemia 12/13/2018   Atrial fibrillation (HCC) 03/23/2017   Prolonged Q-T interval on ECG 02/24/2017   Hypokalemia 02/24/2017   Acute ischemic stroke ([place]) 02/23/2017   Malnutrition of moderate degree 12/24/2016   Problem with dialysis access ([place]) 12/21/2016   Acute ischemic colitis ([place]) 05/16/2016   Colitis 05/15/2016   Rectal bleeding 05/15/2016   Neurologic abnormality 11/19/2015   Hyperkalemia 11/19/2015   Anxiety 11/19/2015   Insomnia 11/19/2015   Gait instability    Stroke-like symptom 11/16/2015   Vestibular neuritis 11/16/2015   Stroke (cerebrum) ([place]) 11/16/2015   Dizziness 05/09/2015   Ataxia 05/09/2015   H/O: CVA (cerebrovascular accident) 05/09/2015   Left facial numbness 05/09/2015   Left leg numbness 05/09/2015   Hyperlipidemia    Acute on chronic diastolic heart failure ([place])    Shortness of breath 04/01/2013   HTN (hypertension) 04/01/2013   Cholecystitis, acute 11/07/2012   End-stage renal disease on hemodialysis ([place]) 11/07/2012   Anemia in chronic kidney disease 11/07/2012    Past Surgical History:  Procedure Laterality Date   AV FISTULA PLACEMENT     for dialysis   AV FISTULA PLACEMENT Left 11/22/2015   Procedure: ARTERIOVENOUS (AV) FISTULA CREATION-LEFT BRACHIOCEPHALIC;  Surgeon: Serafina Mitchell, MD;  Location: [facility];  Service: Vascular;  Laterality: Left;   AV FISTULA PLACEMENT Right 03/15/2020   Procedure: INSERTION OF ARTERIOVENOUS (AV) GORE-TEX GRAFT ARM ( BRACHIAL AXILLARY );  Surgeon: Katha Cabal, MD;  Location: [facility];  Service: Vascular;  Laterality: Right;   BACK SURGERY     CERVICAL FUSION     CHOLECYSTECTOMY  12/02/2012   Procedure: LAPAROSCOPIC CHOLECYSTECTOMY WITH INTRAOPERATIVE CHOLANGIOGRAM;  Surgeon: Edward Jolly, MD;  Location: [facility];  Service: General;  Laterality: N/A;   EYE SURGERY Bilateral    cataract surgery   EYE SURGERY  Left 2019   laser   HEMATOMA EVACUATION Left 12/24/2016   Procedure: EVACUATION HEMATOMA LEFT UPPER ARM;  Surgeon: Waynetta Sandy, MD;  Location: [place];  Service: Vascular;  Laterality: Left;   I & D EXTREMITY Left 12/31/2016   Procedure: IRRIGATION AND DEBRIDEMENT EXTREMITY;  Surgeon: Angelia Mould, MD;  Location: [facility];  Service: Vascular;  Laterality: Left;   INSERTION OF DIALYSIS CATHETER Right 12/24/2016   Procedure: INSERTION OF DIALYSIS CATHETER;  Surgeon: Waynetta Sandy, MD;  Location: [facility];  Service: Vascular;  Laterality: Right;   INSERTION OF DIALYSIS CATHETER Right 02/04/2017   Procedure: INSERTION OF DIALYSIS CATHETER;  Surgeon: Waynetta Sandy, MD;  Location: [facility];  Service: Vascular;  Laterality: Right;   KIDNEY TRANSPLANT  1996   PERIPHERAL VASCULAR CATHETERIZATION Left 10/23/2016   Procedure: Fistulagram;  Surgeon: Elam Dutch, MD;  Location: [facility];  Service: Cardiovascular;  Laterality: Left;   PERIPHERAL VASCULAR THROMBECTOMY Right 04/16/2020   Procedure: PERIPHERAL VASCULAR THROMBECTOMY;  Surgeon: Katha Cabal, MD;  Location: [place] [facility];  Service: Cardiovascular;  Laterality: Right;   RESECTION OF ARTERIOVENOUS FISTULA ANEURYSM  Left 11/22/2015   Procedure: RESECTION OF LEFT RADIOCEPHALIC FISTULA ANEURYSM ;  Surgeon: Serafina Mitchell, MD;  Location: [place];  Service: Vascular;  Laterality: Left;   REVISON OF ARTERIOVENOUS FISTULA Left 12/22/2016   Procedure: REVISON OF LEFT ARTERIOVENOUS FISTULA;  Surgeon: Waynetta Sandy, MD;  Location: [facility];  Service: Vascular;  Laterality: Left;   REVISON OF ARTERIOVENOUS FISTULA Left 02/04/2017   Procedure: REVISON OF LEFT UPPER ARM ARTERIOVENOUS FISTULA;  Surgeon: Waynetta Sandy, MD;  Location: [facility];  Service: Vascular;  Laterality: Left;   UPPER EXTREMITY ANGIOGRAPHY Bilateral 09/19/2019   Procedure: UPPER EXTREMITY ANGIOGRAPHY;  Surgeon: Katha Cabal, MD;  Location: [place] [facility];  Service: Cardiovascular;  Laterality: Bilateral;     OB History   No obstetric history on file.     Family History  Problem Relation Age of Onset   Colon cancer Brother    Cancer Brother    Coronary artery disease Mother 51   Hyperlipidemia Mother    Hypertension Mother    Stroke Maternal Aunt    Esophageal cancer Neg Hx    Stomach cancer Neg Hx    Rectal cancer Neg Hx     Social History   Tobacco Use   Smoking status: Former    Types: Cigarettes    Quit date: 12/31/1991    Years since quitting: 29.6   Smokeless tobacco: Never  Vaping Use   Vaping Use: Never used  Substance Use Topics   Alcohol use: No    Alcohol/week: 0.0 standard drinks   Drug use: No    Home Medications Prior to Admission medications   Medication Sig Start Date End Date Taking? Authorizing Provider  acetaminophen (TYLENOL) 500 MG tablet Take 1 tablet (500 mg total) by mouth every 6 (six) hours as needed. Patient taking differently: Take 500 mg by mouth every 6 (six) hours as needed for mild pain. 09/05/18   Law, Bea Graff, PA-C  allopurinol (ZYLOPRIM) 300 MG tablet Take 150 mg by mouth daily.     [provider]  ALPRAZolam Duanne Moron) 0.25 MG tablet Take 0.25 mg by mouth daily as needed for anxiety.     [provider]  aspirin EC 81 MG EC tablet Take 1 tablet (81 mg total) by mouth daily. 05/14/20   Black, Lezlie Octave, NP  atorvastatin (LIPITOR) 80 MG tablet TAKE 1 TABLET (80 MG TOTAL) BY MOUTH DAILY. Patient taking differently: Take 80 mg by mouth daily. 02/25/21 02/25/22  Dessa Phi, DO  AURYXIA 1 GM 210 MG(Fe) tablet Take 420 mg by mouth 3 (three) times daily with meals.     [provider]  busPIRone (BUSPAR) 10 MG tablet TAKE 1 TABLET (10 MG TOTAL) BY MOUTH TWO TIMES DAILY. Patient taking differently: Take 10 mg by mouth 2 (two) times daily. 02/25/21 02/25/22  Dessa Phi, DO  cinacalcet (SENSIPAR) 30 MG tablet Take 30 mg by mouth  daily. 01/04/20   [provider]  doxycycline (VIBRAMYCIN) 100 MG capsule Take 1 capsule (100 mg total) by mouth 2 (two) times daily. 05/18/21   Varney Biles, MD  lactulose (CHRONULAC) 10 GM/15ML solution Take 20 g by mouth daily as needed for mild constipation.    [provider]  losartan (COZAAR) 50 MG tablet Take 50 mg by mouth daily. 09/25/20   [provider]  ondansetron (ZOFRAN) 8 MG tablet Take 8 mg by mouth 2 (two) times daily as needed for nausea or vomiting.  [provider]  sucralfate (CARAFATE) 1 g tablet Take 1 tablet (1 g total) by mouth 4 (four) times daily -  with meals and at bedtime. 07/27/21   Jeanell Sparrow, DO  zolpidem (AMBIEN) 10 MG tablet Take 10 mg by mouth at bedtime as needed for sleep. 02/13/21   [provider]    Allergies    Sulfa antibiotics and Adhesive [tape]  Review of Systems   Review of Systems  Respiratory:  Negative for cough and shortness of breath.   Gastrointestinal:  Positive for abdominal pain, diarrhea and nausea. Negative for blood in stool and vomiting.  Neurological:  Positive for weakness.   Physical Exam Updated Vital Signs BP 129/75 (BP Location: Right Leg)   Pulse 97   Temp 98.2 F (36.8 C)   Resp 18   SpO2 93%   Physical Exam Constitutional:      Appearance: She is ill-appearing.  HENT:     Head: Normocephalic and atraumatic.  Cardiovascular:     Rate and Rhythm: Regular rhythm. Tachycardia present.  Pulmonary:     Effort: Pulmonary effort is normal.     Breath sounds: Normal breath sounds.  Abdominal:     General: Bowel sounds are normal. There is distension.     Palpations: Abdomen is soft.     Tenderness: There is generalized abdominal tenderness.  Musculoskeletal:     Right lower leg: No edema.     Left lower leg: No edema.  Skin:    General: Skin is warm and dry.  Neurological:     Mental Status: She is alert.    ED Results / Procedures / Treatments    Labs (all labs ordered are listed, but only abnormal results are displayed) Labs Reviewed  CBC WITH DIFFERENTIAL/PLATELET - Abnormal; Notable for the following components:      Result Value   RDW 15.7 (*)    All other components within normal limits  COMPREHENSIVE METABOLIC PANEL - Abnormal; Notable for the following components:   Chloride 94 (*)    BUN 36 (*)    Creatinine, Ser 6.23 (*)    Calcium 8.3 (*)    Albumin 3.1 (*)    Alkaline Phosphatase 149 (*)    GFR, Estimated 7 (*)    Anion gap 17 (*)    All other components within normal limits  RESP PANEL BY RT-PCR (FLU A&B, COVID) [id number]  LIPASE, BLOOD    EKG EKG Interpretation  Date/Time:  Wednesday August 06 2021 12:40:17 EDT Ventricular Rate:  100 PR Interval:  177 QRS Duration: 88 QT Interval:  371 QTC Calculation: 479 R Axis:   89 Text Interpretation: Sinus tachycardia Probable left atrial enlargement Borderline right axis deviation Nonspecific T abnrm, anterolateral leads Since last tracing rate faster Confirmed by Isla Pence [phone]) on 08/06/2021 12:48:49 PM  Radiology No results found.  Procedures Procedures   Medications Ordered in ED Medications  morphine 2 MG/ML injection 2 mg (has no administration in time range)  ondansetron (ZOFRAN) injection 4 mg (has no administration in time range)    ED Course  I have reviewed the triage vital signs and the nursing notes.  Pertinent labs & imaging results that were available during my care of the patient were reviewed by me and considered in my medical decision making (see chart for details).    MDM Rules/Calculators/A&P  Patient's pain improved after bowel movement. CMP grossly unremarkable, normal white count. COVID test ordered. CXR showed left bibasilar opacities that could represent atelectasis, aspiration, and pneumonia, however CTAP showed a perforation of the sigmoid colon due to diverticulitis. Surgery consult placed.  Will consult medicine for admission. Made patient NPO in case of required surgery. Zosyn started.   Final Clinical Impression(s) / ED Diagnoses Final diagnoses:  Generalized abdominal pain  Nausea  Diarrhea, unspecified type    Rx / DC Orders ED Discharge Orders     None        Scarlett Presto, MD 08/06/21 [place], Julie, MD 08/06/21 1535

## 2021-08-06 NOTE — Progress Notes (Signed)
Patient arrived to [place] room 14 Alert and oriented x4, O2 [place] 3L. Pain level 5/10 in stomach.  Bed in lowest position, call light in reach. Will continue to monitor patient

## 2021-08-06 NOTE — Consult Note (Signed)
[facility] Surgery Consult Note  [name] 04-Sep-1951  [id number].    Requesting MD: Ilene Qua Chief Complaint: Abdominal pain Reason for Consult: Sigmoid diverticulitis with microperforation  HPI:  Patient is a 70 year old female presented to the ED with complaints of abdominal pain.  Pain reportedly started this AM.  She is also having some nausea but no vomiting.  She is on dialysis and her last treatment was Monday and she is scheduled for dialysis this afternoon.  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  She has a history of congestive heart failure, CAD with an NSTEMI, hx CVA, hx hypertension,  hx depression with anxiety.  Work-up in the ED: Afebrile, vital signs are stable.  CMP shows chloride of 94, creatinine up to 6.23, lipase 40 LFTs are normal.  WBC 4.9, H/H12.5/40, platelets 212,000.  Respiratory panel was negative.  CT scan shows acute sigmoid diverticulitis with a few adjacent locules of extraluminal air compatible with microperforation, no organized fluid collection or abscess.  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  Chronic changes of renal osteodystrophy, chronic erosive changes centered in the L4-5 disc without evidence of progression from prior study.  Also an anterior lateral pelvis density measuring 4.9 x 2.4 cm compatible with chronic hematoma.  We are asked to see.   Review of Systems  Constitutional:  Positive for fever. Negative for weight loss.  HENT: Negative.    Eyes: Negative.   Respiratory:  Negative for cough, hemoptysis, sputum production, shortness of breath and wheezing.   Cardiovascular:  Positive for leg swelling (Occasional).  Gastrointestinal:  Positive for abdominal pain,  constipation (She has constipation then followed by some diarrhea.  She wears depends.), heartburn, nausea and vomiting. Negative for blood in stool.  Genitourinary:        She is not making any urine at this time.  Musculoskeletal:        She has some arthritis in her hands.  She also notes her abnormal left clavicle is from a prior clavicle fracture fracture.  Skin: Negative.   Neurological: Negative.        She has issues with balance and walks with a walker.  She currently lives with her 20 year old mother because of falls.  Endo/Heme/Allergies:  Bruises/bleeds easily.  Psychiatric/Behavioral: Negative.     Family History  Problem Relation Age of Onset   Colon cancer Brother    Cancer Brother    Coronary artery disease Mother 27   Hyperlipidemia Mother    Hypertension Mother    Stroke Maternal Aunt    Esophageal cancer Neg Hx    Stomach cancer Neg Hx    Rectal cancer Neg Hx     Past Medical History:  Diagnosis Date   Adenomatous polyp of colon 10/2010, 2006, 2015   Anemia in CKD (chronic kidney disease) 11/07/2012   s/p blood transfusion.    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  Had some early rejection then stable function for years, then had  slow decline of function and went back on hemodialysis in 2012.  Gets HD TTS schedule at [facility] still using L forearm AVF.      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HCC)    left  upper arm   Pulmonary edema 12/2019   Stroke (HCC) 11/2015   TIA   Weight loss, unintentional     Past Surgical History:  Procedure Laterality Date   AV FISTULA PLACEMENT     for dialysis   AV FISTULA PLACEMENT Left 11/22/2015   Procedure: ARTERIOVENOUS (AV) FISTULA CREATION-LEFT BRACHIOCEPHALIC;  Surgeon: Nada Libman, MD;  Location: [facility] OR;  Service: Vascular;  Laterality: Left;   AV FISTULA PLACEMENT Right 03/15/2020   Procedure: INSERTION OF ARTERIOVENOUS (AV) GORE-TEX GRAFT ARM ( BRACHIAL AXILLARY );  Surgeon: Renford Dills, MD;  Location: [facility] ORS;  Service: Vascular;  Laterality: Right;   BACK SURGERY     CERVICAL FUSION     CHOLECYSTECTOMY  12/02/2012   Procedure: LAPAROSCOPIC CHOLECYSTECTOMY WITH INTRAOPERATIVE CHOLANGIOGRAM;  Surgeon: Mariella Saa, MD;  Location: [facility] OR;  Service: General;  Laterality: N/A;   EYE SURGERY Bilateral    cataract surgery   EYE SURGERY Left 2019   laser   HEMATOMA EVACUATION Left 12/24/2016   Procedure: EVACUATION HEMATOMA LEFT UPPER ARM;  Surgeon: Maeola Harman, MD;  Location: [facility] OR;  Service: Vascular;  Laterality: Left;   I & D EXTREMITY Left 12/31/2016   Procedure: IRRIGATION AND DEBRIDEMENT EXTREMITY;  Surgeon: Chuck Hint, MD;  Location: [facility] OR;  Service: Vascular;  Laterality: Left;   INSERTION OF DIALYSIS CATHETER Right 12/24/2016   Procedure: INSERTION OF DIALYSIS CATHETER;  Surgeon: Maeola Harman, MD;  Location: [facility] OR;  Service: Vascular;  Laterality: Right;   INSERTION OF DIALYSIS CATHETER Right 02/04/2017   Procedure: INSERTION OF DIALYSIS CATHETER;  Surgeon: Maeola Harman, MD;  Location: [facility] OR;  Service: Vascular;  Laterality: Right;   KIDNEY TRANSPLANT  1996   PERIPHERAL VASCULAR CATHETERIZATION Left 10/23/2016   Procedure: Fistulagram;  Surgeon: Sherren Kerns, MD;  Location: [facility] INVASIVE CV LAB;  Service: Cardiovascular;  Laterality: Left;   PERIPHERAL VASCULAR THROMBECTOMY Right  04/16/2020   Procedure: PERIPHERAL VASCULAR THROMBECTOMY;  Surgeon: Renford Dills, MD;  Location: [facility] INVASIVE CV LAB;  Service: Cardiovascular;  Laterality: Right;   RESECTION OF ARTERIOVENOUS FISTULA ANEURYSM Left 11/22/2015   Procedure: RESECTION OF LEFT RADIOCEPHALIC FISTULA ANEURYSM ;  Surgeon: Nada Libman, MD;  Location: [facility] OR;  Service: Vascular;  Laterality: Left;   REVISON OF ARTERIOVENOUS FISTULA Left 12/22/2016   Procedure: REVISON OF LEFT ARTERIOVENOUS FISTULA;  Surgeon: Maeola Harman, MD;  Location: [facility] OR;  Service: Vascular;  Laterality: Left;   REVISON OF ARTERIOVENOUS FISTULA Left 02/04/2017   Procedure: REVISON OF LEFT UPPER ARM ARTERIOVENOUS FISTULA;  Surgeon: Maeola Harman, MD;  Location: [facility] OR;  Service: Vascular;  Laterality: Left;   UPPER EXTREMITY ANGIOGRAPHY Bilateral 09/19/2019   Procedure: UPPER EXTREMITY ANGIOGRAPHY;  Surgeon: Renford Dills, MD;  Location: [facility] INVASIVE CV LAB;  Service: Cardiovascular;  Laterality: Bilateral;    Social History:  reports that she quit smoking about 29 years ago. Her smoking use included cigarettes. She has never used smokeless tobacco. She reports that she does not drink alcohol and does not use drugs.  Tobacco: She says she is smoked heavily up until the time of her renal transplant 1996 EtOH: At least social use prior to her transplant she also quit this in 1996. 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  Allergies:  Allergies  Allergen Reactions   Sulfa Antibiotics Other (See Comments)    Per patient, both parents allergic-so will not take   Adhesive [Tape] Itching    Prior to Admission medications   Medication Sig Start Date End Date Taking? Authorizing Provider  acetaminophen (TYLENOL) 500 MG tablet Take 1 tablet (500 mg total) by mouth every 6 (six) hours as needed. Patient taking differently: Take 500 mg by mouth every 6 (six) hours as needed for mild pain.  09/05/18   Law, Waylan Boga, PA-C  allopurinol (ZYLOPRIM) 300 MG tablet Take 150 mg by mouth daily.     [provider]  ALPRAZolam Prudy Feeler) 0.25 MG tablet Take 0.25 mg by mouth daily as needed for anxiety.     [provider]  aspirin EC 81 MG EC tablet Take 1 tablet (81 mg total) by mouth daily. 05/14/20   Black, Lesle Chris, NP  atorvastatin (LIPITOR) 80 MG tablet TAKE 1 TABLET (80 MG TOTAL) BY MOUTH DAILY. Patient taking differently: Take 80 mg by mouth daily. 02/25/21 02/25/22  Noralee Stain, DO  AURYXIA 1 GM 210 MG(Fe) tablet Take 420 mg by mouth 3 (three) times daily with meals.     [provider]  busPIRone (BUSPAR) 10 MG tablet TAKE 1 TABLET (10 MG TOTAL) BY MOUTH TWO TIMES DAILY. Patient taking differently: Take 10 mg by mouth 2 (two) times daily. 02/25/21 02/25/22  Noralee Stain, DO  cinacalcet (SENSIPAR) 30 MG tablet Take 30 mg by mouth daily. 01/04/20   [provider]  doxycycline (VIBRAMYCIN) 100 MG capsule Take 1 capsule (100 mg total) by mouth 2 (two) times daily. 05/18/21   Derwood Kaplan, MD  lactulose (CHRONULAC) 10 GM/15ML solution Take 20 g by mouth daily as needed for mild constipation.    [provider]  losartan (COZAAR) 50 MG tablet Take 50 mg by mouth daily. 09/25/20   [provider]  ondansetron (ZOFRAN) 8 MG tablet Take 8 mg by mouth 2 (two) times daily as needed for nausea or vomiting.     [provider]  sucralfate (CARAFATE) 1 g tablet Take 1 tablet (1 g total) by mouth 4 (four) times daily -  with meals and at bedtime. 07/27/21   Sloan Leiter, DO  zolpidem (AMBIEN) 10 MG tablet Take 10 mg by mouth at bedtime as needed for sleep. 02/13/21   [provider]     Blood pressure (!) 124/58, pulse 95, temperature 98.2 F (36.8 C), resp. rate 18, SpO2 99 %. Physical Exam:  General: Elderly, frail-appearing African-American woman who is laying in bed in NAD she continues to have abdominal discomfort.  She is  pretty warm and oral temperature when I was in the room was 99.2. HEENT: head is normocephalic, atraumatic.  Bilateral arcus senilis sclera are noninjected.  Pupils are equal.  Ears and nose without any masses or lesions.  Mouth is pink and moist Heart: regular, rate, and rhythm.  Normal s1,s2. No obvious murmurs, gallops, or rubs noted.  Palpable radial  and pedal pulses bilaterally She has a abnormal left clavicle from prior fracture. Lungs: CTAB, no wheezes, rhonchi, or rales noted.  Respiratory effort nonlabored Abd: soft,, but rather diffusely tender, mildly distended, her tenderness right now seems more severe on the right than the left.  She has a midline surgical scar.  +BS, no masses, hernias, or organomegaly MS: all 4 extremities are symmetrical with no cyanosis, clubbing, or edema.  Left upper extremity AV fistula Skin: warm and dry with no masses, lesions, or rashes Neuro: Cranial nerves 2-12 grossly intact, sensation is normal throughout Psych: A&Ox3 with an appropriate affect.   Results for orders placed or performed during the hospital encounter of 08/06/21 (from the past 48 hour(s))  CBC with Differential     Status: Abnormal   Collection Time: 08/06/21  9:51 AM  Result Value Ref Range   WBC 4.9 4.0 - 10.5 K/uL   RBC 4.12 3.87 - 5.11 MIL/uL   Hemoglobin 12.5 12.0 - 15.0 g/dL   HCT 19.1 47.8 - 29.5 %   MCV 97.1 80.0 - 100.0 fL   MCH 30.3 26.0 - 34.0 pg   MCHC 31.3 30.0 - 36.0 g/dL   RDW 62.1 (H) 30.8 - 65.7 %   Platelets 212 150 - 400 K/uL   nRBC 0.0 0.0 - 0.2 %   Neutrophils Relative % 62 %   Neutro Abs 3.0 1.7 - 7.7 K/uL   Lymphocytes Relative 26 %   Lymphs Abs 1.3 0.7 - 4.0 K/uL   Monocytes Relative 9 %   Monocytes Absolute 0.5 0.1 - 1.0 K/uL   Eosinophils Relative 3 %   Eosinophils Absolute 0.2 0.0 - 0.5 K/uL   Basophils Relative 0 %   Basophils Absolute 0.0 0.0 - 0.1 K/uL   Immature Granulocytes 0 %   Abs Immature Granulocytes 0.01 0.00 - 0.07 K/uL    Comment:  Performed at [facility], [address], [place], [place] [address]  Comprehensive metabolic panel     Status: Abnormal   Collection Time: 08/06/21  9:51 AM  Result Value Ref Range   Sodium 138 135 - 145 mmol/L   Potassium 4.3 3.5 - 5.1 mmol/L   Chloride 94 (L) 98 - 111 mmol/L   CO2 27 22 - 32 mmol/L   Glucose, Bld 90 70 - 99 mg/dL    Comment: Glucose reference range applies only to samples taken after fasting for at least 8 hours.   BUN 36 (H) 8 - 23 mg/dL   Creatinine, Ser 2.95 (H) 0.44 - 1.00 mg/dL   Calcium 8.3 (L) 8.9 - 10.3 mg/dL   Total Protein 6.9 6.5 - 8.1 g/dL   Albumin 3.1 (L) 3.5 - 5.0 g/dL   AST 20 15 - 41 U/L   ALT 11 0 - 44 U/L   Alkaline Phosphatase 149 (H) 38 - 126 U/L   Total Bilirubin 1.1 0.3 - 1.2 mg/dL   GFR, Estimated 7 (L) >60 mL/min    Comment: (NOTE) Calculated using the CKD-EPI Creatinine Equation (2021)    Anion gap 17 (H) 5 - 15    Comment: Performed at [facility], [address], [place], [place] [address]  Lipase, blood     Status: None   Collection Time: 08/06/21  9:51 AM  Result Value Ref Range   Lipase 40 11 - 51 U/L    Comment: Performed at [facility], [address], [place], [place] [address]  Resp Panel by  RT-PCR (Flu A&B, Covid) Nasopharyngeal Swab     Status: None   Collection Time: 08/06/21 12:54 PM   Specimen: Nasopharyngeal Swab; Nasopharyngeal(NP) swabs in vial transport medium  Result Value Ref Range   SARS Coronavirus 2 by RT PCR NEGATIVE NEGATIVE    Comment: (NOTE) SARS-CoV-2 target nucleic acids are NOT DETECTED.  The SARS-CoV-2 RNA is generally detectable in upper respiratory specimens during the acute phase of infection. The lowest concentration of SARS-CoV-2 viral copies this assay can detect is 138 copies/mL. A negative result does not preclude SARS-Cov-2 infection and should not be used as the sole basis for treatment or other patient management decisions. A negative result may occur with  improper  specimen collection/handling, submission of specimen other than nasopharyngeal swab, presence of viral mutation(s) within the areas targeted by this assay, and inadequate number of viral copies(<138 copies/mL). A negative result must be combined with clinical observations, patient history, and epidemiological information. The expected result is Negative.  Fact Sheet for Patients:  BloggerCourse.com  Fact Sheet for Healthcare Providers:  SeriousBroker.it  This test is no t yet approved or cleared by the [place] FDA and  has been authorized for detection and/or diagnosis of SARS-CoV-2 by FDA under an Emergency Use Authorization (EUA). This EUA will remain  in effect (meaning this test can be used) for the duration of the COVID-19 declaration under Section 564(b)(1) of the Act, 21 U.S.C.section 360bbb-3(b)(1), unless the authorization is terminated  or revoked sooner.       Influenza A by PCR NEGATIVE NEGATIVE   Influenza B by PCR NEGATIVE NEGATIVE    Comment: (NOTE) The Xpert Xpress SARS-CoV-2/FLU/RSV plus assay is intended as an aid in the diagnosis of influenza from Nasopharyngeal swab specimens and should not be used as a sole basis for treatment. Nasal washings and aspirates are unacceptable for Xpert Xpress SARS-CoV-2/FLU/RSV testing.  Fact Sheet for Patients: BloggerCourse.com  Fact Sheet for Healthcare Providers: SeriousBroker.it  This test is not yet approved or cleared by the [place] FDA and has been authorized for detection and/or diagnosis of SARS-CoV-2 by FDA under an Emergency Use Authorization (EUA). This EUA will remain in effect (meaning this test can be used) for the duration of the COVID-19 declaration under Section 564(b)(1) of the Act, 21 U.S.C. section 360bbb-3(b)(1), unless the authorization is terminated or revoked.  Performed at [facility], [address] [place], [place] [address]    CT ABDOMEN PELVIS W CONTRAST  Result Date: 08/06/2021 CLINICAL DATA:  Nausea/vomiting Abdominal pain, acute, nonlocalized EXAM: CT ABDOMEN AND PELVIS WITH CONTRAST TECHNIQUE: Multidetector CT imaging of the abdomen and pelvis was performed using the standard protocol following bolus administration of intravenous contrast. CONTRAST:  OMNIPAQUE IOHEXOL 350 MG/ML SOLN COMPARISON:  07/21/2020 FINDINGS: Lower chest: Dependent bibasilar atelectasis. Cardiomegaly. Coronary artery calcification. Hepatobiliary: No focal liver abnormality is seen. Status post cholecystectomy. No biliary dilatation. Pancreas: Unremarkable. No pancreatic ductal dilatation or surrounding inflammatory changes. Spleen: Normal in size without focal abnormality. Adrenals/Urinary Tract: Unremarkable adrenal glands. Advanced bilateral renal atrophy. Left renal cyst has slightly increased in size from prior now measuring 6.4 cm (previously 5.6 cm). No hydronephrosis. Urinary bladder is decompressed, limiting its evaluation. Stomach/Bowel: Stomach is unremarkable. No dilated loops of bowel. There is a focally thickened short segment of sigmoid colon which contains multiple diverticula. Adjacent pericolonic fat stranding, free fluid, and numerous foci of extraluminal air (series 3, images 38-47). No organized fluid collection or abscess. There is mild long  segment thickening throughout the remaining colon. There are multiple thickened appearing loops of small bowel within the abdomen, likely a reactive enteritis. Vascular/Lymphatic: Advanced atherosclerotic calcifications throughout the aortoiliac axis. 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 No lymphadenopathy. Reproductive: Uterus and bilateral adnexa are unremarkable. Other: Small volume ascites. Chronic soft tissue density in the anterolateral  pelvis with scattered internal calcifications measuring approximately 4.9 x 2.4 cm, unchanged, and most compatible with a chronic hematoma. Musculoskeletal: Chronic changes of renal osteodystrophy. Chronic erosive changes centered at the L4-5 disc, without evidence of progression from prior. Unchanged grade 2 anterolisthesis of L4 on L5. No new or acute bony findings. IMPRESSION: 1. Acute sigmoid diverticulitis with a few adjacent locules of extraluminal air compatible with microperforation. No organized fluid collection or abscess. Surgical consultation recommended. 2. Multiple thickened appearing loops of large and small bowel within the abdomen, likely a reactive enterocolitis. 3. Small volume ascites. 4. 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 5. Chronic changes of renal osteodystrophy. Chronic erosive changes centered at the L4-5 disc, without evidence of progression from prior study. Aortic Atherosclerosis (ICD10-I70.0). These results were called by telephone at the time of interpretation on 08/06/2021 at 3:09 pm to provider Ilene Qua, MD, who verbally acknowledged these results. Electronically Signed   By: Duanne Guess D.O.   On: 08/06/2021 15:12   DG Chest Portable 1 View  Result Date: 08/06/2021 CLINICAL DATA:  abdominal pain EXAM: PORTABLE CHEST 1 VIEW COMPARISON:  07/27/2021. FINDINGS: Similar enlargement the cardiac silhouette. Pulmonary vascular congestion. Calcific atherosclerosis of the aorta. Left basilar opacities. No visible pleural effusions or pneumothorax. Right upper quadrant clips. Partially imaged cervical ACDF. IMPRESSION: 1. Cardiomegaly and pulmonary vascular congestion. 2. Left basilar opacities, which could represent atelectasis, aspiration, and/or pneumonia. Electronically Signed   By: Feliberto Harts M.D.   On: 08/06/2021 13:41      Assessment/Plan  Sigmoid diverticulitis with  microperforation End-stage renal disease on dialysis Monday Wednesday Friday  -Recommend keeping her on ice chips and sips.  Agree with IV Zosyn, at this point we will plan medical management with antibiotics and bowel rest for now.  If her symptoms become worse or she becomes clinically unstable she might require surgery at a later time.   FEN: N.p.o./ice chips/sips ID: Zosyn 8/24 >> day 1 DVT: SCDs/she can have DVT prophylactic anticoagulation from our standpoint.   Renal transplant 1996, resume dialysis 2012 CAD, Hx NSTEMI Hx CHF Hx CVA Hx hypertension Hx anxiety depression  Lakota Schweppe, [facility] Surgery 08/06/2021, 3:27 PM Please see Amion for pager number during day hours 7:00am-4:30pm

## 2021-08-06 NOTE — ED Notes (Signed)
Attempted to give reportx1 

## 2021-08-06 NOTE — H&P (Addendum)
History and Physical    [name] DTO:[id number] DOB: Nov 03, 1951 DOA: 08/06/2021  Referring MD/NP/PA: Scarlett Presto PCP: [name], MD  Patient coming from: Home  Chief Complaint: Abdominal pain  I have personally briefly reviewed patient's old medical records in [place]   HPI: [name] is a 70 y.o. 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  Pain was located above her bellybutton and felt like a knife stabbing her in her stomach.  She tried to eat a part of a sausage biscuit without improvement in symptoms.  Associated symptoms of nausea, but denies having any vomiting.  She has been having diarrhea intermittently and still has been able to pass flatus.  Denies having any blood in stools or recent fall.  ED Course: Upon admission into the emergency department patient was seen to be afebrile, pulse 93-116, respirations 16-20, and O2 saturations reported as low as 88% with improvement on 2 L nasal cannula oxygen.  Labs noted CBC within normal limits, potassium 4.3, BUN 36, creatinine 6.23. Chest x-ray noted cardiomegaly with pulmonary vascular congestion left basilar opacities concerning for possible atelectasis versus aspiration versus pneumonia.  Influenza and COVID-19 screening was negative.  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  General surgery was consulted.  Patient was given normal  saline IV fluids 100 mL/h, morphine, Zosyn,  Review of Systems  Constitutional:  Negative for fever and weight loss.  HENT:  Negative for congestion and nosebleeds.   Eyes:  Negative for photophobia and pain.  Respiratory:  Negative for cough and shortness of breath.   Cardiovascular:  Negative for chest pain and leg swelling.  Gastrointestinal:  Positive for abdominal pain, diarrhea and nausea. Negative for blood in stool.  Genitourinary:  Negative for dysuria and hematuria.  Musculoskeletal:  Positive for joint pain. Negative for falls.  Skin:  Negative for itching.  Neurological:  Positive for focal weakness (Chronic secondary to previous stroke). Negative for loss of consciousness.  Endo/Heme/Allergies:  Bruises/bleeds easily.  Psychiatric/Behavioral:  Negative for substance abuse.    Past Medical History:  Diagnosis Date   Adenomatous polyp of colon 10/2010, 2006, 2015   Anemia in CKD (chronic kidney disease) 11/07/2012   s/p blood transfusion.    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  Had some early rejection then stable function for years, then had slow decline of function and went back on hemodialysis in 2012.  Gets HD TTS schedule at [facility] still using L forearm AVF.      GERD (gastroesophageal reflux disease)    GI bleed 2017   felt to be ischemic colitis, last colo 2015   Headache    Hyperlipidemia    Hypertension  Neurologic gait dysfunction    Neuromuscular disorder (HCC)    neuropathy hand and legs   Osteoporosis    Pneumonia    Pseudoaneurysm of surgical AV fistula ([place])    left upper arm   Pulmonary edema 12/2019   Stroke ([place]) 11/2015   TIA   Weight loss, unintentional      Past Surgical History:  Procedure Laterality Date   AV FISTULA PLACEMENT     for dialysis   AV FISTULA PLACEMENT Left 11/22/2015   Procedure: ARTERIOVENOUS (AV) FISTULA CREATION-LEFT BRACHIOCEPHALIC;  Surgeon: Serafina Mitchell, MD;  Location: [place];  Service: Vascular;  Laterality: Left;   AV FISTULA PLACEMENT Right 03/15/2020   Procedure: INSERTION OF ARTERIOVENOUS (AV) GORE-TEX GRAFT ARM ( BRACHIAL AXILLARY );  Surgeon: Katha Cabal, MD;  Location: [facility];  Service: Vascular;  Laterality: Right;   [employer]  12/02/2012   Procedure: LAPAROSCOPIC CHOLECYSTECTOMY WITH INTRAOPERATIVE CHOLANGIOGRAM;  Surgeon: Edward Jolly, MD;  Location: [place];  Service: General;  Laterality: N/A;   EYE SURGERY Bilateral    cataract surgery   EYE SURGERY Left 2019   laser   HEMATOMA EVACUATION Left 12/24/2016   Procedure: EVACUATION HEMATOMA LEFT UPPER ARM;  Surgeon: Waynetta Sandy, MD;  Location: [place];  Service: Vascular;  Laterality: Left;   I & D EXTREMITY Left 12/31/2016   Procedure: IRRIGATION AND DEBRIDEMENT EXTREMITY;  Surgeon: Angelia Mould, MD;  Location: [facility];  Service: Vascular;  Laterality: Left;   INSERTION OF DIALYSIS CATHETER Right 12/24/2016   Procedure: INSERTION OF DIALYSIS CATHETER;  Surgeon: Waynetta Sandy, MD;  Location: [place];  Service: Vascular;  Laterality: Right;   INSERTION OF DIALYSIS CATHETER Right 02/04/2017   Procedure: INSERTION OF DIALYSIS CATHETER;  Surgeon: Waynetta Sandy, MD;  Location: [place];  Service: Vascular;  Laterality: Right;   [place] Left 10/23/2016   Procedure: Fistulagram;  Surgeon: Elam Dutch, MD;  Location: [place] [facility];  Service: Cardiovascular;  Laterality: Left;   PERIPHERAL VASCULAR THROMBECTOMY Right 04/16/2020   Procedure: PERIPHERAL VASCULAR THROMBECTOMY;  Surgeon: Katha Cabal, MD;   Location: [place] [facility];  Service: Cardiovascular;  Laterality: Right;   RESECTION OF ARTERIOVENOUS FISTULA ANEURYSM Left 11/22/2015   Procedure: RESECTION OF LEFT RADIOCEPHALIC FISTULA ANEURYSM ;  Surgeon: Serafina Mitchell, MD;  Location: [place];  Service: Vascular;  Laterality: Left;   REVISON OF ARTERIOVENOUS FISTULA Left 12/22/2016   Procedure: REVISON OF LEFT ARTERIOVENOUS FISTULA;  Surgeon: Waynetta Sandy, MD;  Location: [place];  Service: Vascular;  Laterality: Left;   REVISON OF ARTERIOVENOUS FISTULA Left 02/04/2017   Procedure: REVISON OF LEFT UPPER ARM ARTERIOVENOUS FISTULA;  Surgeon: Waynetta Sandy, MD;  Location: [place];  Service: Vascular;  Laterality: Left;   UPPER EXTREMITY ANGIOGRAPHY Bilateral 09/19/2019   Procedure: UPPER EXTREMITY ANGIOGRAPHY;  Surgeon: Katha Cabal, MD;  Location: [place] [facility];  Service: Cardiovascular;  Laterality: Bilateral;     reports that she quit smoking about 29 years ago. Her smoking use included cigarettes. She has never used smokeless tobacco. She reports that she does not drink alcohol and does not use drugs.  Allergies  Allergen Reactions   Sulfa Antibiotics Other (See Comments)    Per patient, both parents allergic-so will not take   Adhesive [Tape] Itching    Family History  Problem Relation Age of Onset   Colon cancer Brother    Cancer Brother    Coronary artery disease Mother 76   Hyperlipidemia Mother    Hypertension Mother    Stroke Maternal Aunt    Esophageal cancer Neg Hx    Stomach cancer Neg Hx    Rectal cancer Neg Hx     Prior to Admission medications   Medication Sig Start Date End Date Taking? Authorizing Provider  acetaminophen (TYLENOL) 500 MG tablet Take 1 tablet (500 mg total) by mouth every 6 (six) hours as needed. Patient taking differently: Take 500 mg by mouth every 6 (six) hours as needed for mild pain. 09/05/18   Law, Bea Graff, PA-C  allopurinol (ZYLOPRIM) 300 MG tablet Take  150 mg by mouth daily.     [provider]  ALPRAZolam Duanne Moron) 0.25 MG tablet Take 0.25 mg by mouth daily as needed for anxiety.     [provider]  aspirin EC 81 MG EC tablet Take 1 tablet (81 mg total) by mouth daily. 05/14/20   Black, Lezlie Octave, NP  atorvastatin (LIPITOR) 80 MG tablet TAKE 1 TABLET (80 MG TOTAL) BY MOUTH DAILY. Patient taking differently: Take 80 mg by mouth daily. 02/25/21 02/25/22  Dessa Phi, DO  AURYXIA 1 GM 210 MG(Fe) tablet Take 420 mg by mouth 3 (three) times daily with meals.     [provider]  busPIRone (BUSPAR) 10 MG tablet TAKE 1 TABLET (10 MG TOTAL) BY MOUTH TWO TIMES DAILY. Patient taking differently: Take 10 mg by mouth 2 (two) times daily. 02/25/21 02/25/22  Dessa Phi, DO  cinacalcet (SENSIPAR) 30 MG tablet Take 30 mg by mouth daily. 01/04/20   [provider]  doxycycline (VIBRAMYCIN) 100 MG capsule Take 1 capsule (100 mg total) by mouth 2 (two) times daily. 05/18/21   Varney Biles, MD  lactulose (CHRONULAC) 10 GM/15ML solution Take 20 g by mouth daily as needed for mild constipation.    [provider]  losartan (COZAAR) 50 MG tablet Take 50 mg by mouth daily. 09/25/20   [provider]  ondansetron (ZOFRAN) 8 MG tablet Take 8 mg by mouth 2 (two) times daily as needed for nausea or vomiting.     [provider]  sucralfate (CARAFATE) 1 g tablet Take 1 tablet (1 g total) by mouth 4 (four) times daily -  with meals and at bedtime. 07/27/21   Jeanell Sparrow, DO  zolpidem (AMBIEN) 10 MG tablet Take 10 mg by mouth at bedtime as needed for sleep. 02/13/21   [provider]    Physical Exam:  Constitutional: Chronically ill-appearing elderly female who appears to be in some discomfort Vitals:   08/06/21 1315 08/06/21 1330 08/06/21 1345 08/06/21 1500  BP: 132/60 (!) 141/63 (!) 133/56 (!) 124/58  Pulse: 93 100 93 95  Resp: (!) 22 20 17 18   Temp:      SpO2: 98% 98% 96% 99%   Eyes: PERRL,  lids and conjunctivae normal ENMT: Mucous membranes are dry. Posterior pharynx clear of any exudate or lesions.  Left-sided facial weakness. Neck: normal, supple, no masses, no thyromegaly Respiratory: clear to auscultation bilaterally, no wheezing, no crackles. Normal respiratory effort. No accessory muscle use.  Cardiovascular: Regular rate and rhythm, no murmurs / rubs / gallops. No extremity edema. 2+ pedal pulses. No carotid bruits.  Right upper extremity fistula in place with palpable thrill Abdomen: Tenderness to palpation of the lower abdomen.  Bowel sounds are appreciated  in all 4 quadrants. Musculoskeletal: no clubbing / cyanosis.  Deformity of the left clavicle from previous fracture Skin: no rashes, lesions, ulcers. No induration Neurologic: CN 2-12 grossly intact. Sensation intact, DTR normal. Strength 4/5 upper extremity.  Strength 5/5 in all other extremities Psychiatric: Normal judgment and insight. Alert and oriented x 3. Normal mood.     Labs on Admission: I have personally reviewed following labs and imaging studies  CBC: Recent Labs  Lab 08/06/21 0951  WBC 4.9  NEUTROABS 3.0  HGB 12.5  HCT 40.0  MCV 97.1  PLT 628   Basic Metabolic Panel: Recent Labs  Lab 08/06/21 0951  NA 138  K 4.3  CL 94*  CO2 27  GLUCOSE 90  BUN 36*  CREATININE 6.23*  CALCIUM 8.3*   GFR: CrCl cannot be calculated (Unknown ideal weight.). Liver Function Tests: Recent Labs  Lab 08/06/21 0951  AST 20  ALT 11  ALKPHOS 149*  BILITOT 1.1  PROT 6.9  ALBUMIN 3.1*   Recent Labs  Lab 08/06/21 0951  LIPASE 40   No results for input(s): AMMONIA in the last 168 hours. Coagulation Profile: No results for input(s): INR, PROTIME in the last 168 hours. Cardiac Enzymes: No results for input(s): CKTOTAL, CKMB, CKMBINDEX, TROPONINI in the last 168 hours. BNP (last 3 results) No results for input(s): PROBNP in the last 8760 hours. HbA1C: No results for input(s): HGBA1C in the last 72  hours. CBG: No results for input(s): GLUCAP in the last 168 hours. Lipid Profile: No results for input(s): CHOL, HDL, LDLCALC, TRIG, CHOLHDL, LDLDIRECT in the last 72 hours. Thyroid Function Tests: No results for input(s): TSH, T4TOTAL, FREET4, T3FREE, THYROIDAB in the last 72 hours. Anemia Panel: No results for input(s): VITAMINB12, FOLATE, FERRITIN, TIBC, IRON, RETICCTPCT in the last 72 hours. Urine analysis:    Component Value Date/Time   COLORURINE YELLOW 01/30/2011 1648   APPEARANCEUR CLOUDY (A) 01/30/2011 1648   LABSPEC 1.016 01/30/2011 1648   PHURINE 5.0 01/30/2011 1648   GLUCOSEU NEGATIVE 10/14/2009 0747   HGBUR SMALL (A) 01/30/2011 1648   BILIRUBINUR NEGATIVE 01/30/2011 1648   KETONESUR NEGATIVE 01/30/2011 1648   PROTEINUR 100 (A) 01/30/2011 1648   UROBILINOGEN 0.2 01/30/2011 1648   NITRITE NEGATIVE 01/30/2011 1648   LEUKOCYTESUR SMALL (A) 01/30/2011 1648   Sepsis Labs: Recent Results (from the past 240 hour(s))  Resp Panel by RT-PCR (Flu A&B, Covid) Nasopharyngeal Swab     Status: None   Collection Time: 07/27/21  4:33 PM   Specimen: Nasopharyngeal Swab; Nasopharyngeal(NP) swabs in vial transport medium  Result Value Ref Range Status   SARS Coronavirus 2 by RT PCR NEGATIVE NEGATIVE Final    Comment: (NOTE) SARS-CoV-2 target nucleic acids are NOT DETECTED.  The SARS-CoV-2 RNA is generally detectable in upper respiratory specimens during the acute phase of infection. The lowest concentration of SARS-CoV-2 viral copies this assay can detect is 138 copies/mL. A negative result does not preclude SARS-Cov-2 infection and should not be used as the sole basis for treatment or other patient management decisions. A negative result may occur with  improper specimen collection/handling, submission of specimen other than nasopharyngeal swab, presence of viral mutation(s) within the areas targeted by this assay, and inadequate number of viral copies(<138 copies/mL). A negative  result must be combined with clinical observations, patient history, and epidemiological information. The expected result is Negative.  Fact Sheet for Patients:  EntrepreneurPulse.com.au  Fact Sheet for Healthcare Providers:  IncredibleEmployment.be  This test is no t  yet approved or cleared by the [place] and  has been authorized for detection and/or diagnosis of SARS-CoV-2 by FDA under an Emergency Use Authorization (EUA). This EUA will remain  in effect (meaning this test can be used) for the duration of the COVID-19 declaration under Section 564(b)(1) of the Act, 21 U.S.C.section 360bbb-3(b)(1), unless the authorization is terminated  or revoked sooner.       Influenza A by PCR NEGATIVE NEGATIVE Final   Influenza B by PCR NEGATIVE NEGATIVE Final    Comment: (NOTE) The Xpert Xpress SARS-CoV-2/FLU/RSV plus assay is intended as an aid in the diagnosis of influenza from Nasopharyngeal swab specimens and should not be used as a sole basis for treatment. Nasal washings and aspirates are unacceptable for Xpert Xpress SARS-CoV-2/FLU/RSV testing.  Fact Sheet for Patients: EntrepreneurPulse.com.au  Fact Sheet for Healthcare Providers: IncredibleEmployment.be  This test is not yet approved or cleared by the [place] FDA and has been authorized for detection and/or diagnosis of SARS-CoV-2 by FDA under an Emergency Use Authorization (EUA). This EUA will remain in effect (meaning this test can be used) for the duration of the COVID-19 declaration under Section 564(b)(1) of the Act, 21 U.S.C. section 360bbb-3(b)(1), unless the authorization is terminated or revoked.  Performed at [facility], [place] [address] [place], [place] [address]   Resp Panel by RT-PCR (Flu A&B, Covid) Nasopharyngeal Swab     Status: None   Collection Time: 08/06/21 12:54 PM   Specimen: Nasopharyngeal Swab;  Nasopharyngeal(NP) swabs in vial transport medium  Result Value Ref Range Status   SARS Coronavirus 2 by RT PCR NEGATIVE NEGATIVE Final    Comment: (NOTE) SARS-CoV-2 target nucleic acids are NOT DETECTED.  The SARS-CoV-2 RNA is generally detectable in upper respiratory specimens during the acute phase of infection. The lowest concentration of SARS-CoV-2 viral copies this assay can detect is 138 copies/mL. A negative result does not preclude SARS-Cov-2 infection and should not be used as the sole basis for treatment or other patient management decisions. A negative result may occur with  improper specimen collection/handling, submission of specimen other than nasopharyngeal swab, presence of viral mutation(s) within the areas targeted by this assay, and inadequate number of viral copies(<138 copies/mL). A negative result must be combined with clinical observations, patient history, and epidemiological information. The expected result is Negative.  Fact Sheet for Patients:  EntrepreneurPulse.com.au  Fact Sheet for Healthcare Providers:  IncredibleEmployment.be  This test is no t yet approved or cleared by the [place] FDA and  has been authorized for detection and/or diagnosis of SARS-CoV-2 by FDA under an Emergency Use Authorization (EUA). This EUA will remain  in effect (meaning this test can be used) for the duration of the COVID-19 declaration under Section 564(b)(1) of the Act, 21 U.S.C.section 360bbb-3(b)(1), unless the authorization is terminated  or revoked sooner.       Influenza A by PCR NEGATIVE NEGATIVE Final   Influenza B by PCR NEGATIVE NEGATIVE Final    Comment: (NOTE) The Xpert Xpress SARS-CoV-2/FLU/RSV plus assay is intended as an aid in the diagnosis of influenza from Nasopharyngeal swab specimens and should not be used as a sole basis for treatment. Nasal washings and aspirates are unacceptable for Xpert Xpress  SARS-CoV-2/FLU/RSV testing.  Fact Sheet for Patients: EntrepreneurPulse.com.au  Fact Sheet for Healthcare Providers: IncredibleEmployment.be  This test is not yet approved or cleared by the [place] FDA and has been authorized for detection and/or diagnosis of SARS-CoV-2 by FDA  under an Emergency Use Authorization (EUA). This EUA will remain in effect (meaning this test can be used) for the duration of the COVID-19 declaration under Section 564(b)(1) of the Act, 21 U.S.C. section 360bbb-3(b)(1), unless the authorization is terminated or revoked.  Performed at [facility], [place] [address], [place], [place] [address]      Radiological Exams on Admission: CT ABDOMEN PELVIS W CONTRAST  Result Date: 08/06/2021 CLINICAL DATA:  Nausea/vomiting Abdominal pain, acute, nonlocalized EXAM: CT ABDOMEN AND PELVIS WITH CONTRAST TECHNIQUE: Multidetector CT imaging of the abdomen and pelvis was performed using the standard protocol following bolus administration of intravenous contrast. CONTRAST:  131mL OMNIPAQUE IOHEXOL 350 MG/ML SOLN COMPARISON:  07/21/2020 FINDINGS: Lower chest: Dependent bibasilar atelectasis. Cardiomegaly. Coronary artery calcification. Hepatobiliary: No focal liver abnormality is seen. Status post cholecystectomy. No biliary dilatation. Pancreas: Unremarkable. No pancreatic ductal dilatation or surrounding inflammatory changes. Spleen: Normal in size without focal abnormality. Adrenals/Urinary Tract: Unremarkable adrenal glands. Advanced bilateral renal atrophy. Left renal cyst has slightly increased in size from prior now measuring 6.4 cm (previously 5.6 cm). No hydronephrosis. Urinary bladder is decompressed, limiting its evaluation. Stomach/Bowel: Stomach is unremarkable. No dilated loops of bowel. There is a focally thickened short segment of sigmoid colon which contains multiple diverticula. Adjacent pericolonic fat stranding, free  fluid, and numerous foci of extraluminal air (series 3, images 38-47). No organized fluid collection or abscess. There is mild long segment thickening throughout the remaining colon. There are multiple thickened appearing loops of small bowel within the abdomen, likely a reactive enteritis. Vascular/Lymphatic: Advanced atherosclerotic calcifications throughout the aortoiliac axis. 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 No lymphadenopathy. Reproductive: Uterus and bilateral adnexa are unremarkable. Other: Small volume ascites. Chronic soft tissue density in the anterolateral pelvis with scattered internal calcifications measuring approximately 4.9 x 2.4 cm, unchanged, and most compatible with a chronic hematoma. Musculoskeletal: Chronic changes of renal osteodystrophy. Chronic erosive changes centered at the L4-5 disc, without evidence of progression from prior. Unchanged grade 2 anterolisthesis of L4 on L5. No new or acute bony findings. IMPRESSION: 1. Acute sigmoid diverticulitis with a few adjacent locules of extraluminal air compatible with microperforation. No organized fluid collection or abscess. Surgical consultation recommended. 2. Multiple thickened appearing loops of large and small bowel within the abdomen, likely a reactive enterocolitis. 3. Small volume ascites. 4. 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 5. Chronic changes of renal osteodystrophy. Chronic erosive changes centered at the L4-5 disc, without evidence of progression from prior study. Aortic Atherosclerosis (ICD10-I70.0). These results were called by telephone at the time of interpretation on 08/06/2021 at 3:09 pm to provider Scarlett Presto, MD, who verbally acknowledged these results. Electronically Signed   By: Davina Poke D.O.   On: 08/06/2021 15:12   DG Chest Portable 1  View  Result Date: 08/06/2021 CLINICAL DATA:  abdominal pain EXAM: PORTABLE CHEST 1 VIEW COMPARISON:  07/27/2021. FINDINGS: Similar enlargement the cardiac silhouette. Pulmonary vascular congestion. Calcific atherosclerosis of the aorta. Left basilar opacities. No visible pleural effusions or pneumothorax. Right upper quadrant clips. Partially imaged cervical ACDF. IMPRESSION: 1. Cardiomegaly and pulmonary vascular congestion. 2. Left basilar opacities, which could represent atelectasis, aspiration, and/or pneumonia. Electronically Signed   By: Margaretha Sheffield M.D.   On: 08/06/2021 13:41    EKG: Independently reviewed.  Sinus tachycardia 100 bpm    Assessment/Plan Sigmoid diverticulitis with microperforation: Acute.  Patient presents with complaints of pain which started this morning.  CT scan significant for sigmoid diverticulitis with signs of microperforation and ascites.  General surgery was formally consulted.  Patient started on biotics of [place] to a medical telemetry bed -NPO except for Sips of ice -Continue empiric antibiotics of Zosyn -Morphine IV as needed for pain -Appreciate general surgery consultative services we will follow-up for any further recommendation  Acute respiratory failure with hypoxia: Acute.  On admission patient was noted to be hypoxic down into the 88% for which she was started on 2 L nasal cannula oxygen.  Chest x-ray significant for cardiomegaly and pulmonary vascular congestion with left basilar opacities.  Possibly aspect of fluid overload given patient has not recently had dialysis. -Continue nasal cannula oxygen as needed to maintain O2 saturation greater than 92% -DuoNebs as needed  ESRD on HD: Patient normally dialyzes Monday, Wednesday, Friday.  Last dialyzed on 8/22.  Labs noted potassium 4.3, BUN 36, creatinine 6.23 -Continue Cinacalcet -HD per nephrology  History of CVA with residual deficit: Patient has residual left-sided facial weakness and  left upper extremity weakness and she relates to prior stroke in 2018. -Continue statin and aspirin  Paroxysmal atrial fibrillation: Patient currently in sinus rhythm.Not on anticoagulation due to history of bleeding  Essential hypertension: Blood pressures currently appear to be controlled.  Patient had not been taking losartan 50 mg daily. -Continue to monitor   Hyperlipidemia: Patient reported that she had not been taking atorvastatin 80 mg daily. 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

## 2021-08-06 NOTE — ED Triage Notes (Signed)
Pt arrived by [facility] from home. Reports mid abd pain this am with n/v. Dialysis pt, due for treatment today.

## 2021-08-07 DIAGNOSIS — K5720 Diverticulitis of large intestine with perforation and abscess without bleeding: Secondary | ICD-10-CM | POA: Diagnosis not present

## 2021-08-07 DIAGNOSIS — J9601 Acute respiratory failure with hypoxia: Secondary | ICD-10-CM | POA: Diagnosis not present

## 2021-08-07 LAB — CBC
HCT: 35.1 % — ABNORMAL LOW (ref 36.0–46.0)
Hemoglobin: 11.6 g/dL — ABNORMAL LOW (ref 12.0–15.0)
MCH: 30.7 pg (ref 26.0–34.0)
MCHC: 33 g/dL (ref 30.0–36.0)
MCV: 92.9 fL (ref 80.0–100.0)
Platelets: 216 10*3/uL (ref 150–400)
RBC: 3.78 MIL/uL — ABNORMAL LOW (ref 3.87–5.11)
RDW: 15.7 % — ABNORMAL HIGH (ref 11.5–15.5)
WBC: 15.7 10*3/uL — ABNORMAL HIGH (ref 4.0–10.5)
nRBC: 0 % (ref 0.0–0.2)

## 2021-08-07 LAB — RENAL FUNCTION PANEL
Albumin: 2.7 g/dL — ABNORMAL LOW (ref 3.5–5.0)
Anion gap: 16 — ABNORMAL HIGH (ref 5–15)
BUN: 45 mg/dL — ABNORMAL HIGH (ref 8–23)
CO2: 27 mmol/L (ref 22–32)
Calcium: 8.2 mg/dL — ABNORMAL LOW (ref 8.9–10.3)
Chloride: 92 mmol/L — ABNORMAL LOW (ref 98–111)
Creatinine, Ser: 7.24 mg/dL — ABNORMAL HIGH (ref 0.44–1.00)
GFR, Estimated: 6 mL/min — ABNORMAL LOW (ref >60)
Glucose, Bld: 70 mg/dL (ref 70–99)
Phosphorus: 6.6 mg/dL — ABNORMAL HIGH (ref 2.5–4.6)
Potassium: 4.9 mmol/L (ref 3.5–5.1)
Sodium: 135 mmol/L (ref 135–145)

## 2021-08-07 NOTE — Progress Notes (Signed)
PROGRESS NOTE   [name]  ZRA:[id number]    DOB: 10-Aug-1951    DOA: 08/06/2021  PCP: [name], MD   I have briefly reviewed patients previous medical records in [facility].  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  Admitted for acute sigmoid diverticulitis with microperforation.  General surgery consulted and nephrology consulted for dialysis needs.   Assessment & Plan:  Principal Problem:   Diverticulitis of colon with perforation Active Problems:   End-stage renal disease on hemodialysis ([place])   Hyperlipidemia   Atrial fibrillation (HCC)   Gastro-esophageal reflux disease without esophagitis   Acute respiratory failure with hypoxia ([place])   Acute sigmoid diverticulitis with microperforation: - CT abdomen 8/24: Acute sigmoid diverticulitis with microperforation and no organized fluid collection or abscess.  Likely reactive enterocolitis of small and large bowel. -General surgery consulted -Managing conservatively for now with bowel rest/NPO, IV fluids, IV Zosyn and pain management. - Per surgery, if symptoms worsen or clinically unstable then might require surgery at a later time  Acute respiratory failure with hypoxia - Oxygen saturations 88% in the ED and started on oxygen 2 L/min - Suspect due to atelectasis.  No clinical pneumonia or significant volume overload. - Saturating in the high 90s-100s on 2 L, attempt to wean to room air.  ESRD, failed renal transplant, on MWF HD - Follows with Dr. Hollie Salk, [facility] - Missed HD on 8/24.  Last HD on 8/22 - Nephrology consulted for HD needs.  Anemia of  ESRD - Stable.  History of CVA  - With residual left-sided facial and LUE weakness - Continue prior statins and aspirin  Paroxysmal atrial fibrillation - Currently in sinus rhythm clinically. - Not on anticoagulation due to history of bleeding.  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 - Outpatient follow-up.  There is no height or weight on file to calculate BMI.   DVT prophylaxis: heparin injection 5,000 Units Start: 08/06/21 2200     Code Status: Full Code Family Communication: None at bedside Disposition:  Status is: Inpatient  Remains inpatient appropriate because:Inpatient level of care appropriate due to severity of illness  Dispo: The patient is from: Home              Anticipated d/c is to: Home              Patient currently is not medically stable to d/c.   Difficult to place patient No        Consultants:   General surgery Nephrology  Procedures:   None  Antimicrobials:    Anti-infectives (From admission, onward)    Start     Dose/Rate Route Frequency Ordered Stop   08/06/21 2300  piperacillin-tazobactam (ZOSYN) IVPB 2.25 g        2.25 g 100 mL/hr over 30 Minutes Intravenous Every 8 hours 08/06/21 1611     08/06/21 2200  piperacillin-tazobactam (ZOSYN) IVPB 3.375 g  Status:  Discontinued  3.375 g 100 mL/hr over 30 Minutes Intravenous Every 8 hours 08/06/21 1606 08/06/21 1611   08/06/21 1530  piperacillin-tazobactam (ZOSYN) IVPB 3.375 g        3.375 g 100 mL/hr over 30 Minutes Intravenous  Once 08/06/21 1516 08/06/21 1632         Subjective:  States that her abdominal pain may be slightly better.  Localizes to left lower quadrant.  Indicates that this  started about 3 days prior to admission.  No BM, nausea or vomiting.  No dyspnea or chest pain  Objective:   Vitals:   08/07/21 0844 08/07/21 1305 08/07/21 1321 08/07/21 1441  BP: (!) 132/43 (!) 114/58 136/64   Pulse: 84 81 85   Resp: 19 18 14    Temp: 97.8 F (36.6 C) 98.3 F (36.8 C)  (!) 97.3 F (36.3 C)  TempSrc: Oral   Oral  SpO2: 98% 100%      General exam: Elderly female, small built and thinly nourished lying comfortably propped up in bed without distress.  Oral mucosa moist. Respiratory system: Clear to auscultation. Respiratory effort normal. Cardiovascular system: S1 & S2 heard, RRR.  No JVD. No pedal edema.  Systolic murmur best heard at apex 3/6. Gastrointestinal system: Abdomen is nondistended, soft and nontender. No organomegaly or masses felt. Normal bowel sounds heard. Central nervous system: Alert and oriented. No focal neurological deficits. Extremities: Symmetric 5 x 5 power.  Large left upper arm AVG Skin: No rashes, lesions or ulcers Psychiatry: Judgement and insight appear normal. Mood & affect appropriate.     Data Reviewed:   I have personally reviewed following labs and imaging studies   CBC: Recent Labs  Lab 08/06/21 0951 08/07/21 0131  WBC 4.9 15.7*  NEUTROABS 3.0  --   HGB 12.5 11.6*  HCT 40.0 35.1*  MCV 97.1 92.9  PLT 212 664    Basic Metabolic Panel: Recent Labs  Lab 08/06/21 0951 08/07/21 0131  NA 138 135  K 4.3 4.9  CL 94* 92*  CO2 27 27  GLUCOSE 90 70  BUN 36* 45*  CREATININE 6.23* 7.24*  CALCIUM 8.3* 8.2*  PHOS  --  6.6*    Liver Function Tests: Recent Labs  Lab 08/06/21 0951 08/07/21 0131  AST 20  --   ALT 11  --   ALKPHOS 149*  --   BILITOT 1.1  --   PROT 6.9  --   ALBUMIN 3.1* 2.7*    CBG: No results for input(s): GLUCAP in the last 168 hours.  Microbiology Studies:   Recent Results (from the past 240 hour(s))  Resp Panel by RT-PCR (Flu A&B, Covid) Nasopharyngeal Swab     Status: None   Collection  Time: 08/06/21 12:54 PM   Specimen: Nasopharyngeal Swab; Nasopharyngeal(NP) swabs in vial transport medium  Result Value Ref Range Status   SARS Coronavirus 2 by RT PCR NEGATIVE NEGATIVE Final    Comment: (NOTE) SARS-CoV-2 target nucleic acids are NOT DETECTED.  The SARS-CoV-2 RNA is generally detectable in upper respiratory specimens during the acute phase of infection. The lowest concentration of SARS-CoV-2 viral copies this assay can detect is 138 copies/mL. A negative result does not preclude SARS-Cov-2 infection and should not be used as the sole basis for treatment or other patient management decisions. A negative result may occur with  improper specimen collection/handling, submission of specimen other than nasopharyngeal swab, presence of viral mutation(s) within the areas targeted by this assay, and inadequate number of viral copies(<138 copies/mL).  A negative result must be combined with clinical observations, patient history, and epidemiological information. The expected result is Negative.  Fact Sheet for Patients:  EntrepreneurPulse.com.au  Fact Sheet for Healthcare Providers:  IncredibleEmployment.be  This test is no t yet approved or cleared by the [place] FDA and  has been authorized for detection and/or diagnosis of SARS-CoV-2 by FDA under an Emergency Use Authorization (EUA). This EUA will remain  in effect (meaning this test can be used) for the duration of the COVID-19 declaration under Section 564(b)(1) of the Act, 21 U.S.C.section 360bbb-3(b)(1), unless the authorization is terminated  or revoked sooner.       Influenza A by PCR NEGATIVE NEGATIVE Final   Influenza B by PCR NEGATIVE NEGATIVE Final    Comment: (NOTE) The Xpert Xpress SARS-CoV-2/FLU/RSV plus assay is intended as an aid in the diagnosis of influenza from Nasopharyngeal swab specimens and should not be used as a sole basis for treatment. Nasal washings  and aspirates are unacceptable for Xpert Xpress SARS-CoV-2/FLU/RSV testing.  Fact Sheet for Patients: EntrepreneurPulse.com.au  Fact Sheet for Healthcare Providers: IncredibleEmployment.be  This test is not yet approved or cleared by the [place] FDA and has been authorized for detection and/or diagnosis of SARS-CoV-2 by FDA under an Emergency Use Authorization (EUA). This EUA will remain in effect (meaning this test can be used) for the duration of the COVID-19 declaration under Section 564(b)(1) of the Act, 21 U.S.C. section 360bbb-3(b)(1), unless the authorization is terminated or revoked.  Performed at [facility], [place] [address] [place], [place] [address]      Radiology Studies:  CT ABDOMEN PELVIS W CONTRAST  Result Date: 08/06/2021 CLINICAL DATA:  Nausea/vomiting Abdominal pain, acute, nonlocalized EXAM: CT ABDOMEN AND PELVIS WITH CONTRAST TECHNIQUE: Multidetector CT imaging of the abdomen and pelvis was performed using the standard protocol following bolus administration of intravenous contrast. CONTRAST:  154mL OMNIPAQUE IOHEXOL 350 MG/ML SOLN COMPARISON:  07/21/2020 FINDINGS: Lower chest: Dependent bibasilar atelectasis. Cardiomegaly. Coronary artery calcification. Hepatobiliary: No focal liver abnormality is seen. Status post cholecystectomy. No biliary dilatation. Pancreas: Unremarkable. No pancreatic ductal dilatation or surrounding inflammatory changes. Spleen: Normal in size without focal abnormality. Adrenals/Urinary Tract: Unremarkable adrenal glands. Advanced bilateral renal atrophy. Left renal cyst has slightly increased in size from prior now measuring 6.4 cm (previously 5.6 cm). No hydronephrosis. Urinary bladder is decompressed, limiting its evaluation. Stomach/Bowel: Stomach is unremarkable. No dilated loops of bowel. There is a focally thickened short segment of sigmoid colon which contains multiple diverticula.  Adjacent pericolonic fat stranding, free fluid, and numerous foci of extraluminal air (series 3, images 38-47). No organized fluid collection or abscess. There is mild long segment thickening throughout the remaining colon. There are multiple thickened appearing loops of small bowel within the abdomen, likely a reactive enteritis. Vascular/Lymphatic: Advanced atherosclerotic calcifications throughout the aortoiliac axis. 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 No lymphadenopathy. Reproductive: Uterus and bilateral adnexa are unremarkable. Other: Small volume ascites. Chronic soft tissue density in the anterolateral pelvis with scattered internal calcifications measuring approximately 4.9 x 2.4 cm, unchanged, and most compatible with a chronic hematoma. Musculoskeletal: Chronic changes of renal osteodystrophy. Chronic erosive changes centered at the L4-5 disc, without evidence of progression from prior. Unchanged grade 2 anterolisthesis of L4 on L5. No new or acute bony findings. IMPRESSION: 1. Acute sigmoid diverticulitis with a few adjacent locules of extraluminal air compatible with microperforation. No organized fluid  collection or abscess. Surgical consultation recommended. 2. Multiple thickened appearing loops of large and small bowel within the abdomen, likely a reactive enterocolitis. 3. Small volume ascites. 4. 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 5. Chronic changes of renal osteodystrophy. Chronic erosive changes centered at the L4-5 disc, without evidence of progression from prior study. Aortic Atherosclerosis (ICD10-I70.0). These results were called by telephone at the time of interpretation on 08/06/2021 at 3:09 pm to provider Scarlett Presto, MD, who verbally acknowledged these results. Electronically Signed   By: Davina Poke D.O.   On:  08/06/2021 15:12   DG Chest Portable 1 View  Result Date: 08/06/2021 CLINICAL DATA:  abdominal pain EXAM: PORTABLE CHEST 1 VIEW COMPARISON:  07/27/2021. FINDINGS: Similar enlargement the cardiac silhouette. Pulmonary vascular congestion. Calcific atherosclerosis of the aorta. Left basilar opacities. No visible pleural effusions or pneumothorax. Right upper quadrant clips. Partially imaged cervical ACDF. IMPRESSION: 1. Cardiomegaly and pulmonary vascular congestion. 2. Left basilar opacities, which could represent atelectasis, aspiration, and/or pneumonia. Electronically Signed   By: Margaretha Sheffield M.D.   On: 08/06/2021 13:41     Scheduled Meds:    aspirin EC  81 mg Oral Daily   atorvastatin  80 mg Oral Daily   cinacalcet  30 mg Oral Q breakfast   heparin  5,000 Units Subcutaneous Q8H   pantoprazole  40 mg Oral Daily   sodium chloride flush  3 mL Intravenous Q12H   zolpidem  5 mg Oral QHS    Continuous Infusions:    piperacillin-tazobactam (ZOSYN)  IV 2.25 g (08/07/21 1435)     LOS: 1 day     Vernell Leep, MD, [place], [facility]. [facility]    To contact the attending provider between 7A-7P or the covering provider during after hours 7P-7A, please log into the web site [email or web address] and access using universal [place] password for that web site. If you do not have the password, please call the hospital operator.  08/07/2021, 5:01 PM

## 2021-08-07 NOTE — Progress Notes (Signed)
Subjective: States her pain is somewhat better today.  No nausea or vomiting.  No bowel function since admission.  ROS: See above, otherwise other systems negative  Objective: Vital signs in last 24 hours: Temp:  [97.8 F (36.6 C)-98.9 F (37.2 C)] 97.8 F (36.6 C) (08/25 0844) Pulse Rate:  [84-116] 84 (08/25 0844) Resp:  [16-22] 19 (08/25 0844) BP: (118-153)/(43-132) 132/43 (08/25 0844) SpO2:  [93 %-100 %] 98 % (08/25 0844)    Intake/Output from previous day: 08/24 0701 - 08/25 0700 In: 103 [I.V.:3; IV Piggyback:100] Out: -  Intake/Output this shift: No intake/output data recorded.  PE: Heart: regular Lungs: CTAB Abd: soft, still quite tender with voluntary guarding to palpation in LLQ, +BS, ND  Lab Results:  Recent Labs    08/06/21 0951 08/07/21 0131  WBC 4.9 15.7*  HGB 12.5 11.6*  HCT 40.0 35.1*  PLT 212 216   BMET Recent Labs    08/06/21 0951 08/07/21 0131  NA 138 135  K 4.3 4.9  CL 94* 92*  CO2 27 27  GLUCOSE 90 70  BUN 36* 45*  CREATININE 6.23* 7.24*  CALCIUM 8.3* 8.2*   PT/INR No results for input(s): LABPROT, INR in the last 72 hours. CMP     Component Value Date/Time   NA 135 08/07/2021 0131   NA 140 07/13/2018 0814   K 4.9 08/07/2021 0131   CL 92 (L) 08/07/2021 0131   CO2 27 08/07/2021 0131   GLUCOSE 70 08/07/2021 0131   BUN 45 (H) 08/07/2021 0131   BUN 45 (H) 07/13/2018 0814   CREATININE 7.24 (H) 08/07/2021 0131   CALCIUM 8.2 (L) 08/07/2021 0131   CALCIUM 9.6 08/03/2011 1005   PROT 6.9 08/06/2021 0951   PROT 6.9 07/13/2018 0814   ALBUMIN 2.7 (L) 08/07/2021 0131   ALBUMIN 4.1 07/13/2018 0814   AST 20 08/06/2021 0951   ALT 11 08/06/2021 0951   ALKPHOS 149 (H) 08/06/2021 0951   BILITOT 1.1 08/06/2021 0951   BILITOT 0.4 07/13/2018 0814   GFRNONAA 6 (L) 08/07/2021 0131   GFRAA 7 (L) 07/26/2020 0920   Lipase     Component Value Date/Time   LIPASE 40 08/06/2021 0951       Studies/Results: CT ABDOMEN PELVIS W  CONTRAST  Result Date: 08/06/2021 CLINICAL DATA:  Nausea/vomiting Abdominal pain, acute, nonlocalized EXAM: CT ABDOMEN AND PELVIS WITH CONTRAST TECHNIQUE: Multidetector CT imaging of the abdomen and pelvis was performed using the standard protocol following bolus administration of intravenous contrast. CONTRAST:  116mL OMNIPAQUE IOHEXOL 350 MG/ML SOLN COMPARISON:  07/21/2020 FINDINGS: Lower chest: Dependent bibasilar atelectasis. Cardiomegaly. Coronary artery calcification. Hepatobiliary: No focal liver abnormality is seen. Status post cholecystectomy. No biliary dilatation. Pancreas: Unremarkable. No pancreatic ductal dilatation or surrounding inflammatory changes. Spleen: Normal in size without focal abnormality. Adrenals/Urinary Tract: Unremarkable adrenal glands. Advanced bilateral renal atrophy. Left renal cyst has slightly increased in size from prior now measuring 6.4 cm (previously 5.6 cm). No hydronephrosis. Urinary bladder is decompressed, limiting its evaluation. Stomach/Bowel: Stomach is unremarkable. No dilated loops of bowel. There is a focally thickened short segment of sigmoid colon which contains multiple diverticula. Adjacent pericolonic fat stranding, free fluid, and numerous foci of extraluminal air (series 3, images 38-47). No organized fluid collection or abscess. There is mild long segment thickening throughout the remaining colon. There are multiple thickened appearing loops of small bowel within the abdomen, likely a reactive enteritis. Vascular/Lymphatic: Advanced atherosclerotic calcifications throughout the aortoiliac axis. 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 No lymphadenopathy. Reproductive: Uterus and bilateral adnexa are unremarkable. Other: Small volume ascites. Chronic soft tissue density in the anterolateral pelvis with scattered internal calcifications measuring approximately 4.9 x 2.4  cm, unchanged, and most compatible with a chronic hematoma. Musculoskeletal: Chronic changes of renal osteodystrophy. Chronic erosive changes centered at the L4-5 disc, without evidence of progression from prior. Unchanged grade 2 anterolisthesis of L4 on L5. No new or acute bony findings. IMPRESSION: 1. Acute sigmoid diverticulitis with a few adjacent locules of extraluminal air compatible with microperforation. No organized fluid collection or abscess. Surgical consultation recommended. 2. Multiple thickened appearing loops of large and small bowel within the abdomen, likely a reactive enterocolitis. 3. Small volume ascites. 4. 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 5. Chronic changes of renal osteodystrophy. Chronic erosive changes centered at the L4-5 disc, without evidence of progression from prior study. Aortic Atherosclerosis (ICD10-I70.0). These results were called by telephone at the time of interpretation on 08/06/2021 at 3:09 pm to provider Scarlett Presto, MD, who verbally acknowledged these results. Electronically Signed   By: Davina Poke D.O.   On: 08/06/2021 15:12   DG Chest Portable 1 View  Result Date: 08/06/2021 CLINICAL DATA:  abdominal pain EXAM: PORTABLE CHEST 1 VIEW COMPARISON:  07/27/2021. FINDINGS: Similar enlargement the cardiac silhouette. Pulmonary vascular congestion. Calcific atherosclerosis of the aorta. Left basilar opacities. No visible pleural effusions or pneumothorax. Right upper quadrant clips. Partially imaged cervical ACDF. IMPRESSION: 1. Cardiomegaly and pulmonary vascular congestion. 2. Left basilar opacities, which could represent atelectasis, aspiration, and/or pneumonia. Electronically Signed   By: Margaretha Sheffield M.D.   On: 08/06/2021 13:41    Anti-infectives: Anti-infectives (From admission, onward)    Start     Dose/Rate Route Frequency Ordered Stop   08/06/21 2300   piperacillin-tazobactam (ZOSYN) IVPB 2.25 g        2.25 g 100 mL/hr over 30 Minutes Intravenous Every 8 hours 08/06/21 1611     08/06/21 2200  piperacillin-tazobactam (ZOSYN) IVPB 3.375 g  Status:  Discontinued        3.375 g 100 mL/hr over 30 Minutes Intravenous Every 8 hours 08/06/21 1606 08/06/21 1611   08/06/21 1530  piperacillin-tazobactam (ZOSYN) IVPB 3.375 g        3.375 g 100 mL/hr over 30 Minutes Intravenous  Once 08/06/21 1516 08/06/21 1632        Assessment/Plan  Sigmoid diverticulitis with microperforation -cont abx therapy.  WBC increased from 4 to 14K today -cont bowel rest and NPO given how much pain still persists in LLQ along with increasing WBC. -cont to closely monitor, no acute surgical needs at this time -mobilize/pulm toilet  FEN - NPO/IVFs VTE - heparin ID - zosyn   LOS: 1 day    Henreitta Cea , [facility] 08/07/2021, 9:29 AM Please see Amion for pager number during day hours 7:00am-4:30pm or 7:00am -11:30am on weekends

## 2021-08-07 NOTE — Consult Note (Addendum)
[name] Renal Consultation Note  Indication for Consultation:  Management of ESRD/hemodialysis; anemia, hypertension/volume and secondary hyperparathyroidism  HPI: [name] is a 70 y.o. 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  CCS consulted currently plan for medical management on IV Zosyn bowel rest for now if symptoms "if symptoms worsen or clinically unstable might require surgery at later time" She is compliant with hemodialysis last dialysis was Monday 22nd missed yesterday secondary to symptoms/admit. Current labs K4.9 BUN 45 CR 7.24.  WBC 15.5 Hgb 11.6 Chest x-ray= centimeters/pulmonary venous congestion, left basilar opacities question atelectasis/aspiration or pneumonia O2 sat 98% on 2 L nasal cannula oxygen  Currently states abdominal pain improving with IV antibiotics, denies shortness of breath chest pain.  Reports little p.o. intake past week because abdominal pain      Past Medical History:  Diagnosis Date   Adenomatous polyp of colon 10/2010, 2006, 2015   Anemia in CKD (chronic kidney disease) 11/07/2012   s/p blood transfusion.    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  Had some early rejection then stable function for years, then had slow decline of function and went back on hemodialysis in 2012.  Gets HD TTS schedule at [facility] still using L forearm AVF.      GERD (gastroesophageal  reflux disease)    GI bleed 2017   felt to be ischemic colitis, last colo 2015   Headache    Hyperlipidemia    Hypertension    Neurologic gait dysfunction    Neuromuscular disorder (HCC)    neuropathy hand and legs   Osteoporosis    Pneumonia    Pseudoaneurysm of surgical AV fistula ([place])    left upper arm   Pulmonary edema 12/2019   Stroke ([place]) 11/2015   TIA   Weight loss, unintentional     Past Surgical History:  Procedure Laterality Date   AV FISTULA PLACEMENT     for dialysis   AV FISTULA PLACEMENT Left 11/22/2015   Procedure: ARTERIOVENOUS (AV) FISTULA CREATION-LEFT BRACHIOCEPHALIC;  Surgeon: Serafina Mitchell, MD;  Location: [place];  Service: Vascular;  Laterality: Left;   AV FISTULA PLACEMENT Right 03/15/2020   Procedure: INSERTION OF ARTERIOVENOUS (AV) GORE-TEX GRAFT ARM ( BRACHIAL AXILLARY );  Surgeon: Katha Cabal, MD;  Location: [facility];  Service: Vascular;  Laterality: Right;   BACK SURGERY     CERVICAL FUSION     CHOLECYSTECTOMY  12/02/2012   Procedure: LAPAROSCOPIC CHOLECYSTECTOMY WITH INTRAOPERATIVE CHOLANGIOGRAM;  Surgeon: Edward Jolly, MD;  Location: [place];  Service: General;  Laterality: N/A;   EYE SURGERY Bilateral    cataract surgery   EYE SURGERY Left 2019   laser   HEMATOMA EVACUATION Left 12/24/2016   Procedure: EVACUATION HEMATOMA LEFT UPPER ARM;  Surgeon: Waynetta Sandy, MD;  Location: [place];  Service: Vascular;  Laterality: Left;   I & D EXTREMITY Left 12/31/2016   Procedure: IRRIGATION  AND DEBRIDEMENT EXTREMITY;  Surgeon: Angelia Mould, MD;  Location: [place];  Service: Vascular;  Laterality: Left;   INSERTION OF DIALYSIS CATHETER Right 12/24/2016   Procedure: INSERTION OF DIALYSIS CATHETER;  Surgeon: Waynetta Sandy, MD;  Location: [place];  Service: Vascular;  Laterality: Right;   INSERTION OF DIALYSIS CATHETER Right 02/04/2017   Procedure: INSERTION OF DIALYSIS CATHETER;  Surgeon: Waynetta Sandy, MD;   Location: [place];  Service: Vascular;  Laterality: Right;   [place] Left 10/23/2016   Procedure: Fistulagram;  Surgeon: Elam Dutch, MD;  Location: [place] [facility];  Service: Cardiovascular;  Laterality: Left;   PERIPHERAL VASCULAR THROMBECTOMY Right 04/16/2020   Procedure: PERIPHERAL VASCULAR THROMBECTOMY;  Surgeon: Katha Cabal, MD;  Location: [place] [facility];  Service: Cardiovascular;  Laterality: Right;   RESECTION OF ARTERIOVENOUS FISTULA ANEURYSM Left 11/22/2015   Procedure: RESECTION OF LEFT RADIOCEPHALIC FISTULA ANEURYSM ;  Surgeon: Serafina Mitchell, MD;  Location: [place] [facility];  Service: Vascular;  Laterality: Left;   REVISON OF ARTERIOVENOUS FISTULA Left 12/22/2016   Procedure: REVISON OF LEFT ARTERIOVENOUS FISTULA;  Surgeon: Waynetta Sandy, MD;  Location: [place];  Service: Vascular;  Laterality: Left;   REVISON OF ARTERIOVENOUS FISTULA Left 02/04/2017   Procedure: REVISON OF LEFT UPPER ARM ARTERIOVENOUS FISTULA;  Surgeon: Waynetta Sandy, MD;  Location: [place];  Service: Vascular;  Laterality: Left;   UPPER EXTREMITY ANGIOGRAPHY Bilateral 09/19/2019   Procedure: UPPER EXTREMITY ANGIOGRAPHY;  Surgeon: Katha Cabal, MD;  Location: [place] [facility];  Service: Cardiovascular;  Laterality: Bilateral;      Family History  Problem Relation Age of Onset   Colon cancer Brother    Cancer Brother    Coronary artery disease Mother 58   Hyperlipidemia Mother    Hypertension Mother    Stroke Maternal Aunt    Esophageal cancer Neg Hx    Stomach cancer Neg Hx    Rectal cancer Neg Hx       reports that she quit smoking about 29 years ago. Her smoking use included cigarettes. She has never used smokeless tobacco. She reports that she does not drink alcohol and does not use drugs.   Allergies  Allergen Reactions   Sulfa Antibiotics Other (See Comments)    Per patient, both parents allergic-so will not take    Adhesive [Tape] Itching    Prior to Admission medications   Medication Sig Start Date End Date Taking? Authorizing Provider  acetaminophen (TYLENOL) 500 MG tablet Take 1 tablet (500 mg total) by mouth every 6 (six) hours as needed. Patient taking differently: Take 500 mg by mouth every 6 (six) hours as needed for mild pain. 09/05/18  Yes Law, Bea Graff, PA-C  albuterol (VENTOLIN HFA) 108 (90 Base) MCG/ACT inhaler Inhale 2 puffs into the lungs every 6 (six) hours as needed for wheezing or shortness of breath.   Yes [provider]  ALLOPURINOL PO Take 1 tablet by mouth daily as needed (for gout flares).   Yes [provider]  ALPRAZolam (XANAX) 0.25 MG tablet Take 0.25 mg by mouth daily as needed for anxiety.    Yes [provider]  ARTIFICIAL TEARS PF 0.1-0.3 % SOLN Place 1 drop into both eyes 3 (three) times daily as needed (for dryness).   Yes [provider]  aspirin EC 81 MG EC tablet Take 1 tablet (81 mg total) by mouth daily. 05/14/20  Yes  Black, Lezlie Octave, NP  AURYXIA 1 GM 210 MG(Fe) tablet Take 420 mg by mouth 3 (three) times daily with meals.    Yes [provider]  busPIRone (BUSPAR) 10 MG tablet TAKE 1 TABLET (10 MG TOTAL) BY MOUTH TWO TIMES DAILY. Patient taking differently: Take 10 mg by mouth 2 (two) times daily as needed (for anxiety). 02/25/21 02/25/22 Yes Dessa Phi, DO  cinacalcet (SENSIPAR) 30 MG tablet Take 30 mg by mouth daily. 01/04/20  Yes [provider]  lactulose (CHRONULAC) 10 GM/15ML solution Take 20 g by mouth daily as needed for mild constipation.   Yes [provider]  ondansetron (ZOFRAN) 8 MG tablet Take 8 mg by mouth 2 (two) times daily as needed for nausea or vomiting.    Yes [provider]  zolpidem (AMBIEN) 10 MG tablet Take 10 mg by mouth at bedtime. 02/13/21  Yes [provider]  atorvastatin (LIPITOR) 80 MG tablet TAKE 1 TABLET (80 MG TOTAL) BY MOUTH DAILY. Patient not taking:  Reported on 08/06/2021 02/25/21 02/25/22  Dessa Phi, DO  losartan (COZAAR) 50 MG tablet Take 50 mg by mouth daily. Patient not taking: Reported on 08/06/2021 09/25/20   [provider]  sucralfate (CARAFATE) 1 g tablet Take 1 tablet (1 g total) by mouth 4 (four) times daily -  with meals and at bedtime. Patient not taking: No sig reported 07/27/21   Jeanell Sparrow, DO      Results for orders placed or performed during the hospital encounter of 08/06/21 (from the past 48 hour(s))  CBC with Differential     Status: Abnormal   Collection Time: 08/06/21  9:51 AM  Result Value Ref Range   WBC 4.9 4.0 - 10.5 K/uL   RBC 4.12 3.87 - 5.11 MIL/uL   Hemoglobin 12.5 12.0 - 15.0 g/dL   HCT 40.0 36.0 - 46.0 %   MCV 97.1 80.0 - 100.0 fL   MCH 30.3 26.0 - 34.0 pg   MCHC 31.3 30.0 - 36.0 g/dL   RDW 15.7 (H) 11.5 - 15.5 %   Platelets 212 150 - 400 K/uL   nRBC 0.0 0.0 - 0.2 %   Neutrophils Relative % 62 %   Neutro Abs 3.0 1.7 - 7.7 K/uL   Lymphocytes Relative 26 %   Lymphs Abs 1.3 0.7 - 4.0 K/uL   Monocytes Relative 9 %   Monocytes Absolute 0.5 0.1 - 1.0 K/uL   Eosinophils Relative 3 %   Eosinophils Absolute 0.2 0.0 - 0.5 K/uL   Basophils Relative 0 %   Basophils Absolute 0.0 0.0 - 0.1 K/uL   Immature Granulocytes 0 %   Abs Immature Granulocytes 0.01 0.00 - 0.07 K/uL    Comment: Performed at [facility], [address] [place], [place] [address]  Comprehensive metabolic panel     Status: Abnormal   Collection Time: 08/06/21  9:51 AM  Result Value Ref Range   Sodium 138 135 - 145 mmol/L   Potassium 4.3 3.5 - 5.1 mmol/L   Chloride 94 (L) 98 - 111 mmol/L   CO2 27 22 - 32 mmol/L   Glucose, Bld 90 70 - 99 mg/dL    Comment: Glucose reference range applies only to samples taken after fasting for at least 8 hours.   BUN 36 (H) 8 - 23 mg/dL   Creatinine, Ser 6.23 (H) 0.44 - 1.00 mg/dL   Calcium 8.3 (L) 8.9 - 10.3 mg/dL   Total Protein 6.9 6.5 -  8.1 g/dL   Albumin 3.1 (L) 3.5 -  5.0 g/dL   AST 20 15 - 41 U/L   ALT 11 0 - 44 U/L   Alkaline Phosphatase 149 (H) 38 - 126 U/L   Total Bilirubin 1.1 0.3 - 1.2 mg/dL   GFR, Estimated 7 (L) >60 mL/min    Comment: (NOTE) Calculated using the CKD-EPI Creatinine Equation (2021)    Anion gap 17 (H) 5 - 15    Comment: Performed at [facility] [address], [place], [place] [address]  Lipase, blood     Status: None   Collection Time: 08/06/21  9:51 AM  Result Value Ref Range   Lipase 40 11 - 51 U/L    Comment: Performed at [facility], [place] [address] [place], [place] [address]  Resp Panel by RT-PCR (Flu A&B, Covid) Nasopharyngeal Swab     Status: None   Collection Time: 08/06/21 12:54 PM   Specimen: Nasopharyngeal Swab; Nasopharyngeal(NP) swabs in vial transport medium  Result Value Ref Range   SARS Coronavirus 2 by RT PCR NEGATIVE NEGATIVE    Comment: (NOTE) SARS-CoV-2 target nucleic acids are NOT DETECTED.  The SARS-CoV-2 RNA is generally detectable in upper respiratory specimens during the acute phase of infection. The lowest concentration of SARS-CoV-2 viral copies this assay can detect is 138 copies/mL. A negative result does not preclude SARS-Cov-2 infection and should not be used as the sole basis for treatment or other patient management decisions. A negative result may occur with  improper specimen collection/handling, submission of specimen other than nasopharyngeal swab, presence of viral mutation(s) within the areas targeted by this assay, and inadequate number of viral copies(<138 copies/mL). A negative result must be combined with clinical observations, patient history, and epidemiological information. The expected result is Negative.  Fact Sheet for Patients:  EntrepreneurPulse.com.au  Fact Sheet for Healthcare Providers:  IncredibleEmployment.be  This test is no t yet approved or cleared by the [place] FDA and  has been authorized for  detection and/or diagnosis of SARS-CoV-2 by FDA under an Emergency Use Authorization (EUA). This EUA will remain  in effect (meaning this test can be used) for the duration of the COVID-19 declaration under Section 564(b)(1) of the Act, 21 U.S.C.section 360bbb-3(b)(1), unless the authorization is terminated  or revoked sooner.       Influenza A by PCR NEGATIVE NEGATIVE   Influenza B by PCR NEGATIVE NEGATIVE    Comment: (NOTE) The Xpert Xpress SARS-CoV-2/FLU/RSV plus assay is intended as an aid in the diagnosis of influenza from Nasopharyngeal swab specimens and should not be used as a sole basis for treatment. Nasal washings and aspirates are unacceptable for Xpert Xpress SARS-CoV-2/FLU/RSV testing.  Fact Sheet for Patients: EntrepreneurPulse.com.au  Fact Sheet for Healthcare Providers: IncredibleEmployment.be  This test is not yet approved or cleared by the [place] FDA and has been authorized for detection and/or diagnosis of SARS-CoV-2 by FDA under an Emergency Use Authorization (EUA). This EUA will remain in effect (meaning this test can be used) for the duration of the COVID-19 declaration under Section 564(b)(1) of the Act, 21 U.S.C. section 360bbb-3(b)(1), unless the authorization is terminated or revoked.  Performed at [facility], [place] [address] [place], [place] [address]   HIV Antibody (routine testing w rflx)     Status: None   Collection Time: 08/06/21  6:10 PM  Result Value Ref Range   HIV Screen 4th Generation wRfx Non Reactive Non Reactive  Comment: Performed at [facility], [place] [address], [place], [place] [address]  CBC     Status: Abnormal   Collection Time: 08/07/21  1:31 AM  Result Value Ref Range   WBC 15.7 (H) 4.0 - 10.5 K/uL   RBC 3.78 (L) 3.87 - 5.11 MIL/uL   Hemoglobin 11.6 (L) 12.0 - 15.0 g/dL   HCT 35.1 (L) 36.0 - 46.0 %   MCV 92.9 80.0 - 100.0 fL   MCH 30.7 26.0 - 34.0 pg   MCHC 33.0  30.0 - 36.0 g/dL   RDW 15.7 (H) 11.5 - 15.5 %   Platelets 216 150 - 400 K/uL   nRBC 0.0 0.0 - 0.2 %    Comment: Performed at [facility], [place] [address], [place], [place] [address]  Renal function panel     Status: Abnormal   Collection Time: 08/07/21  1:31 AM  Result Value Ref Range   Sodium 135 135 - 145 mmol/L   Potassium 4.9 3.5 - 5.1 mmol/L   Chloride 92 (L) 98 - 111 mmol/L   CO2 27 22 - 32 mmol/L   Glucose, Bld 70 70 - 99 mg/dL    Comment: Glucose reference range applies only to samples taken after fasting for at least 8 hours.   BUN 45 (H) 8 - 23 mg/dL   Creatinine, Ser 7.24 (H) 0.44 - 1.00 mg/dL   Calcium 8.2 (L) 8.9 - 10.3 mg/dL   Phosphorus 6.6 (H) 2.5 - 4.6 mg/dL   Albumin 2.7 (L) 3.5 - 5.0 g/dL   GFR, Estimated 6 (L) >60 mL/min    Comment: (NOTE) Calculated using the CKD-EPI Creatinine Equation (2021)    Anion gap 16 (H) 5 - 15    Comment: Performed at [facility] [address] [place], [place] [address]    ROS: See HPI  Physical Exam: Vitals:   08/07/21 0440 08/07/21 0844  BP: 127/78 (!) 132/43  Pulse: 93 84  Resp: 17 19  Temp: 97.9 F (36.6 C) 97.8 F (36.6 C)  SpO2: 100% 98%     General: Thin chronically ill elderly female but pleasant and talkative , NAD HEENT=[place], PERRLA,  EOMI, nonicteric, MMM Neck: No JVD Heart: RRR no MRG Lungs: CTA nonlabored breathing Abdomen: NABS, soft, mildly tender epigastric periumbilical nondistended Extremities: No pedal edema Skin: Warm dry no overt rash or pedal ulcers Neuro: Alert O x3 ,moves all extremities to request, mild left upper extremity weakness prior stroke Dialysis Access: Positive bruit right upper arm AVG /has old left aneurysmal AV fistula  Dialysis Orders: Center: [facility] , MWF 4-hour 2K, 2 calcium bath, EDW 49.5  No heparin, RUA AVGG No ESA  ,hgb 11.2, 11.8/Mircera 60 7/18 Sensipar 30 Hectorol 4  Assessment/Plan Sigmoid diverticulitis with microperforation= on IV Zosyn and CTS  following currently bowel rest n.p.o. ESRD -normal HD MWF, last HD Monday 8/22, a.m. K4.9 O2 sat 98/100% on nasal cannula oxygen no excess volume on exam noted chest x-ray pulm vascular congestion  Hd today  Hypertension/volume  -BP stable was on losartan as an outpatient does not need currently Anemia of ESRD-Hgb 11.6 no ESA needed Metabolic bone disease -corrected calcium okay, phosphorus 6.6 phosphorus binder when taking p.o.'s Hectorol 4 mics on dialysis History of CVA-on statin and aspirin History of proximal A. Fib/sinus rhythm no anticoagulation secondary to bleed rate stable without meds To anxiety on Xanax and BuSpar per admit History of CAD= currently hemodynamically stable no angina  Ernest Haber, PA-C [facility] [phone] 08/07/2021, 9:26 AM

## 2021-08-08 DIAGNOSIS — J9601 Acute respiratory failure with hypoxia: Secondary | ICD-10-CM | POA: Diagnosis not present

## 2021-08-08 DIAGNOSIS — K5720 Diverticulitis of large intestine with perforation and abscess without bleeding: Secondary | ICD-10-CM | POA: Diagnosis not present

## 2021-08-08 LAB — RENAL FUNCTION PANEL
Albumin: 2.5 g/dL — ABNORMAL LOW (ref 3.5–5.0)
Anion gap: 13 (ref 5–15)
BUN: 17 mg/dL (ref 8–23)
CO2: 27 mmol/L (ref 22–32)
Calcium: 8.7 mg/dL — ABNORMAL LOW (ref 8.9–10.3)
Chloride: 96 mmol/L — ABNORMAL LOW (ref 98–111)
Creatinine, Ser: 3.79 mg/dL — ABNORMAL HIGH (ref 0.44–1.00)
GFR, Estimated: 12 mL/min — ABNORMAL LOW (ref >60)
Glucose, Bld: 57 mg/dL — ABNORMAL LOW (ref 70–99)
Phosphorus: 4.2 mg/dL (ref 2.5–4.6)
Potassium: 3.9 mmol/L (ref 3.5–5.1)
Sodium: 136 mmol/L (ref 135–145)

## 2021-08-08 LAB — GLUCOSE, CAPILLARY
Glucose-Capillary: 52 mg/dL — ABNORMAL LOW (ref 70–99)
Glucose-Capillary: 156 mg/dL — ABNORMAL HIGH (ref 70–99)
Glucose-Capillary: 80 mg/dL (ref 70–99)

## 2021-08-08 LAB — CBC
HCT: 34.4 % — ABNORMAL LOW (ref 36.0–46.0)
Hemoglobin: 11.3 g/dL — ABNORMAL LOW (ref 12.0–15.0)
MCH: 30.4 pg (ref 26.0–34.0)
MCHC: 32.8 g/dL (ref 30.0–36.0)
MCV: 92.5 fL (ref 80.0–100.0)
Platelets: 240 10*3/uL (ref 150–400)
RBC: 3.72 MIL/uL — ABNORMAL LOW (ref 3.87–5.11)
RDW: 15.6 % — ABNORMAL HIGH (ref 11.5–15.5)
WBC: 12.2 10*3/uL — ABNORMAL HIGH (ref 4.0–10.5)
nRBC: 0 % (ref 0.0–0.2)

## 2021-08-08 MED ORDER — DEXTROSE 50 % IV SOLN
INTRAVENOUS | Status: AC
  Filled 2021-08-08: qty 50

## 2021-08-08 MED ORDER — DEXTROSE 50 % IV SOLN
25.0000 g | INTRAVENOUS | Status: AC
  Administered 2021-08-08: 25 g via INTRAVENOUS

## 2021-08-08 NOTE — Progress Notes (Signed)
Subjective: Slightly more lethargic this am.  Did have a hypoglycemic incident recently that may be contributing.  She states her pain is much better today.  No n/v  ROS: See above, otherwise other systems negative  Objective: Vital signs in last 24 hours: Temp:  [97.3 F (36.3 C)-98.5 F (36.9 C)] 98.3 F (36.8 C) (08/26 0353) Pulse Rate:  [79-98] 97 (08/26 0353) Resp:  [14-19] 16 (08/26 0353) BP: (95-136)/(28-79) 113/28 (08/26 0353) SpO2:  [92 %-100 %] 100 % (08/26 0305) Weight:  [49.9 kg-62.5 kg] 49.9 kg (08/26 0350) Last BM Date: 08/07/21  Intake/Output from previous day: 08/25 0701 - 08/26 0700 In: 100.3 [IV Piggyback:100.3] Out: 500  Intake/Output this shift: No intake/output data recorded.  PE: Heart: regular Lungs: CTAB Abd: soft, still tender but less so in LLQ, improving, +BS, ND  Lab Results:  Recent Labs    08/07/21 0131 08/08/21 0453  WBC 15.7* 12.2*  HGB 11.6* 11.3*  HCT 35.1* 34.4*  PLT 216 240   BMET Recent Labs    08/07/21 0131 08/08/21 0453  NA 135 136  K 4.9 3.9  CL 92* 96*  CO2 27 27  GLUCOSE 70 57*  BUN 45* 17  CREATININE 7.24* 3.79*  CALCIUM 8.2* 8.7*   PT/INR No results for input(s): LABPROT, INR in the last 72 hours. CMP     Component Value Date/Time   NA 136 08/08/2021 0453   NA 140 07/13/2018 0814   K 3.9 08/08/2021 0453   CL 96 (L) 08/08/2021 0453   CO2 27 08/08/2021 0453   GLUCOSE 57 (L) 08/08/2021 0453   BUN 17 08/08/2021 0453   BUN 45 (H) 07/13/2018 0814   CREATININE 3.79 (H) 08/08/2021 0453   CALCIUM 8.7 (L) 08/08/2021 0453   CALCIUM 9.6 08/03/2011 1005   PROT 6.9 08/06/2021 0951   PROT 6.9 07/13/2018 0814   ALBUMIN 2.5 (L) 08/08/2021 0453   ALBUMIN 4.1 07/13/2018 0814   AST 20 08/06/2021 0951   ALT 11 08/06/2021 0951   ALKPHOS 149 (H) 08/06/2021 0951   BILITOT 1.1 08/06/2021 0951   BILITOT 0.4 07/13/2018 0814   GFRNONAA 12 (L) 08/08/2021 0453   GFRAA 7 (L) 07/26/2020 0920   Lipase      Component Value Date/Time   LIPASE 40 08/06/2021 0951       Studies/Results: CT ABDOMEN PELVIS W CONTRAST  Result Date: 08/06/2021 CLINICAL DATA:  Nausea/vomiting Abdominal pain, acute, nonlocalized EXAM: CT ABDOMEN AND PELVIS WITH CONTRAST TECHNIQUE: Multidetector CT imaging of the abdomen and pelvis was performed using the standard protocol following bolus administration of intravenous contrast. CONTRAST:  157mL OMNIPAQUE IOHEXOL 350 MG/ML SOLN COMPARISON:  07/21/2020 FINDINGS: Lower chest: Dependent bibasilar atelectasis. Cardiomegaly. Coronary artery calcification. Hepatobiliary: No focal liver abnormality is seen. Status post cholecystectomy. No biliary dilatation. Pancreas: Unremarkable. No pancreatic ductal dilatation or surrounding inflammatory changes. Spleen: Normal in size without focal abnormality. Adrenals/Urinary Tract: Unremarkable adrenal glands. Advanced bilateral renal atrophy. Left renal cyst has slightly increased in size from prior now measuring 6.4 cm (previously 5.6 cm). No hydronephrosis. Urinary bladder is decompressed, limiting its evaluation. Stomach/Bowel: Stomach is unremarkable. No dilated loops of bowel. There is a focally thickened short segment of sigmoid colon which contains multiple diverticula. Adjacent pericolonic fat stranding, free fluid, and numerous foci of extraluminal air (series 3, images 38-47). No organized fluid collection or abscess. There is mild long segment thickening throughout the remaining colon. There are multiple thickened appearing loops of  small bowel within the abdomen, likely a reactive enteritis. Vascular/Lymphatic: Advanced atherosclerotic calcifications throughout the aortoiliac axis. 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 No lymphadenopathy. Reproductive: Uterus and bilateral adnexa are unremarkable. Other: Small volume ascites. Chronic soft tissue  density in the anterolateral pelvis with scattered internal calcifications measuring approximately 4.9 x 2.4 cm, unchanged, and most compatible with a chronic hematoma. Musculoskeletal: Chronic changes of renal osteodystrophy. Chronic erosive changes centered at the L4-5 disc, without evidence of progression from prior. Unchanged grade 2 anterolisthesis of L4 on L5. No new or acute bony findings. IMPRESSION: 1. Acute sigmoid diverticulitis with a few adjacent locules of extraluminal air compatible with microperforation. No organized fluid collection or abscess. Surgical consultation recommended. 2. Multiple thickened appearing loops of large and small bowel within the abdomen, likely a reactive enterocolitis. 3. Small volume ascites. 4. 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 5. Chronic changes of renal osteodystrophy. Chronic erosive changes centered at the L4-5 disc, without evidence of progression from prior study. Aortic Atherosclerosis (ICD10-I70.0). These results were called by telephone at the time of interpretation on 08/06/2021 at 3:09 pm to provider Scarlett Presto, MD, who verbally acknowledged these results. Electronically Signed   By: Davina Poke D.O.   On: 08/06/2021 15:12   DG Chest Portable 1 View  Result Date: 08/06/2021 CLINICAL DATA:  abdominal pain EXAM: PORTABLE CHEST 1 VIEW COMPARISON:  07/27/2021. FINDINGS: Similar enlargement the cardiac silhouette. Pulmonary vascular congestion. Calcific atherosclerosis of the aorta. Left basilar opacities. No visible pleural effusions or pneumothorax. Right upper quadrant clips. Partially imaged cervical ACDF. IMPRESSION: 1. Cardiomegaly and pulmonary vascular congestion. 2. Left basilar opacities, which could represent atelectasis, aspiration, and/or pneumonia. Electronically Signed   By: Margaretha Sheffield M.D.   On: 08/06/2021 13:41    Anti-infectives: Anti-infectives (From  admission, onward)    Start     Dose/Rate Route Frequency Ordered Stop   08/06/21 2300  piperacillin-tazobactam (ZOSYN) IVPB 2.25 g        2.25 g 100 mL/hr over 30 Minutes Intravenous Every 8 hours 08/06/21 1611     08/06/21 2200  piperacillin-tazobactam (ZOSYN) IVPB 3.375 g  Status:  Discontinued        3.375 g 100 mL/hr over 30 Minutes Intravenous Every 8 hours 08/06/21 1606 08/06/21 1611   08/06/21 1530  piperacillin-tazobactam (ZOSYN) IVPB 3.375 g        3.375 g 100 mL/hr over 30 Minutes Intravenous  Once 08/06/21 1516 08/06/21 1632        Assessment/Plan  Sigmoid diverticulitis with microperforation -cont abx therapy.  WBC down to 12K today -will try CLD and see how she does -cont to closely monitor, no acute surgical needs at this time -mobilize/pulm toilet  FEN - CLD/IVFs VTE - heparin ID - zosyn   LOS: 2 days    Henreitta Cea , [facility] Surgery 08/08/2021, 8:17 AM Please see Amion for pager number during day hours 7:00am-4:30pm or 7:00am -11:30am on weekends

## 2021-08-08 NOTE — Care Management Important Message (Signed)
Important Message  Patient Details  Name: [name] MRN: [id number] Date of Birth: Nov 09, 1951   Medicare Important Message Given:  Yes     Marwan Lipe [name] 08/08/2021, 4:43 PM

## 2021-08-08 NOTE — Progress Notes (Signed)
Called son back to give an update on pt. Surgery continuing to treat conservatively. Diet upgraded to clears.

## 2021-08-08 NOTE — Progress Notes (Addendum)
PROGRESS NOTE   [name]  SHF:[id number]    DOB: 04-Oct-1951    DOA: 08/06/2021  PCP: [name], MD   I have briefly reviewed patients previous medical records in [facility].  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  Admitted for acute sigmoid diverticulitis with microperforation.  General surgery consulted and nephrology consulted for dialysis needs.   Assessment & Plan:  Principal Problem:   Diverticulitis of colon with perforation Active Problems:   End-stage renal disease on hemodialysis ([place])   Hyperlipidemia   Atrial fibrillation (HCC)   Gastro-esophageal reflux disease without esophagitis   Acute respiratory failure with hypoxia ([place])   Acute sigmoid diverticulitis with microperforation: - CT abdomen 8/24: Acute sigmoid diverticulitis with microperforation and no organized fluid collection or abscess.  Likely reactive enterocolitis of small and large bowel. -General surgery consulted - Managed conservatively with bowel rest/NPO, IV fluids, IV Zosyn and pain management.  Abdominal pain and WBC down.  Starting clear liquids and monitor. - Per surgery, if symptoms worsen or clinically unstable then might require surgery at a later time  Hypoglycemia: Blood glucose 57 on a.m. labs.  Treated with IV D50.  Likely related to NPO status.  Now on clear liquids.  Monitor CBGs for a day to ensure stability.  Treat per hypoglycemia protocol.  Acute respiratory failure with hypoxia - Oxygen saturations 88% in the ED and started on oxygen 2 L/min - Suspect due to atelectasis.  No clinical pneumonia or significant volume overload. -  Saturating in the high 90s-100s on 2 L, attempt to wean to room air.  ESRD, failed renal transplant, on MWF HD - Follows with Dr. Hollie Salk, [facility] - Missed HD on 8/24.  Last HD on 8/22 - Nephrology consulted for HD needs.  Had HD last night.  Anemia of ESRD - Stable.  History of CVA  - With residual left-sided facial and LUE weakness - Continue prior statins and aspirin  Paroxysmal atrial fibrillation - Currently in sinus rhythm clinically. - Not on anticoagulation due to history of bleeding.  Essential hypertension - Controlled off antihypertensives.  In fact soft blood pressures this morning.  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 - Outpatient follow-up.  Body mass index is 20.14 kg/m.   DVT prophylaxis: heparin injection 5,000 Units Start: 08/06/21 2200     Code Status: Full Code Family Communication: None at bedside.  Attempted to reach patient's son on the phone but it went straight to the voicemail which was full and unable to leave a message. Disposition:  Status is: Inpatient  Remains inpatient appropriate because:Inpatient level of care appropriate due to severity of illness  Dispo: The patient is from: Home              Anticipated d/c is to: Home              Patient currently is not medically stable to d/c.   Difficult to place patient No  Consultants:   General surgery Nephrology  Procedures:   None  Antimicrobials:    Anti-infectives (From admission, onward)    Start     Dose/Rate Route Frequency Ordered Stop   08/06/21 2300  piperacillin-tazobactam (ZOSYN) IVPB 2.25 g        2.25 g 100 mL/hr over 30 Minutes Intravenous Every 8 hours 08/06/21 1611     08/06/21 2200  piperacillin-tazobactam  (ZOSYN) IVPB 3.375 g  Status:  Discontinued        3.375 g 100 mL/hr over 30 Minutes Intravenous Every 8 hours 08/06/21 1606 08/06/21 1611   08/06/21 1530  piperacillin-tazobactam (ZOSYN) IVPB 3.375 g        3.375 g 100 mL/hr over 30 Minutes Intravenous  Once 08/06/21 1516 08/06/21 1632         Subjective:  States that she feels better.  Abdominal pain down to 3/10 in severity.  Passing gas.  No nausea or vomiting.  Objective:   Vitals:   08/08/21 0305 08/08/21 0350 08/08/21 0353 08/08/21 1052  BP: (!) 103/40  (!) 113/28 97/81  Pulse: 94  97 98  Resp:   16 16  Temp:   98.3 F (36.8 C) 97.7 F (36.5 C)  TempSrc:   Oral Oral  SpO2: 100%   91%  Weight: 62 kg 49.9 kg      General exam: Elderly female, small built and thinly nourished lying comfortably propped up in bed without distress.  Oral mucosa moist. Respiratory system: Clear to auscultation.  No increased work of breathing. Cardiovascular system: S1 & S2 heard, RRR.  No JVD. No pedal edema.  Systolic murmur best heard at apex 3/6. Gastrointestinal system: Abdomen is nondistended and soft.  Minimal left lower quadrant tenderness without rigidity, guarding or rebound.  Normal bowel sounds heard. Central nervous system: Alert and oriented. No focal neurological deficits. Extremities: Symmetric 5 x 5 power.  Large left upper arm AVG Skin: No rashes, lesions or ulcers Psychiatry: Judgement and insight appear normal. Mood & affect appropriate.     Data Reviewed:   I have personally reviewed following labs and imaging studies   CBC: Recent Labs  Lab 08/06/21 0951 08/07/21 0131 08/08/21 0453  WBC 4.9 15.7* 12.2*  NEUTROABS 3.0  --   --   HGB 12.5 11.6* 11.3*  HCT 40.0 35.1* 34.4*  MCV 97.1 92.9 92.5  PLT 212 216 161    Basic Metabolic Panel: Recent Labs  Lab 08/06/21 0951 08/07/21 0131 08/08/21 0453  NA 138 135 136  K 4.3 4.9 3.9  CL 94* 92* 96*  CO2 27 27 27   GLUCOSE 90 70 57*  BUN 36* 45* 17   CREATININE 6.23* 7.24* 3.79*  CALCIUM 8.3* 8.2* 8.7*  PHOS  --  6.6* 4.2    Liver Function Tests: Recent Labs  Lab 08/06/21 0951 08/07/21 0131 08/08/21 0453  AST 20  --   --   ALT 11  --   --   ALKPHOS 149*  --   --   BILITOT 1.1  --   --   PROT 6.9  --   --   ALBUMIN 3.1* 2.7* 2.5*    CBG: Recent Labs  Lab 08/08/21 0613 08/08/21 0642  GLUCAP 52* 156*    Microbiology Studies:   Recent Results (from the past 240 hour(s))  Resp Panel by RT-PCR (Flu A&B, Covid) Nasopharyngeal Swab     Status: None   Collection Time: 08/06/21 12:54 PM   Specimen: Nasopharyngeal Swab;  Nasopharyngeal(NP) swabs in vial transport medium  Result Value Ref Range Status   SARS Coronavirus 2 by RT PCR NEGATIVE NEGATIVE Final    Comment: (NOTE) SARS-CoV-2 target nucleic acids are NOT DETECTED.  The SARS-CoV-2 RNA is generally detectable in upper respiratory specimens during the acute phase of infection. The lowest concentration of SARS-CoV-2 viral copies this assay can detect is 138 copies/mL. A negative result does not preclude SARS-Cov-2 infection and should not be used as the sole basis for treatment or other patient management decisions. A negative result may occur with  improper specimen collection/handling, submission of specimen other than nasopharyngeal swab, presence of viral mutation(s) within the areas targeted by this assay, and inadequate number of viral copies(<138 copies/mL). A negative result must be combined with clinical observations, patient history, and epidemiological information. The expected result is Negative.  Fact Sheet for Patients:  EntrepreneurPulse.com.au  Fact Sheet for Healthcare Providers:  IncredibleEmployment.be  This test is no t yet approved or cleared by the [place] FDA and  has been authorized for detection and/or diagnosis of SARS-CoV-2 by FDA under an Emergency Use Authorization (EUA). This EUA will  remain  in effect (meaning this test can be used) for the duration of the COVID-19 declaration under Section 564(b)(1) of the Act, 21 U.S.C.section 360bbb-3(b)(1), unless the authorization is terminated  or revoked sooner.       Influenza A by PCR NEGATIVE NEGATIVE Final   Influenza B by PCR NEGATIVE NEGATIVE Final    Comment: (NOTE) The Xpert Xpress SARS-CoV-2/FLU/RSV plus assay is intended as an aid in the diagnosis of influenza from Nasopharyngeal swab specimens and should not be used as a sole basis for treatment. Nasal washings and aspirates are unacceptable for Xpert Xpress SARS-CoV-2/FLU/RSV testing.  Fact Sheet for Patients: EntrepreneurPulse.com.au  Fact Sheet for Healthcare Providers: IncredibleEmployment.be  This test is not yet approved or cleared by the [place] FDA and has been authorized for detection and/or diagnosis of SARS-CoV-2 by FDA under an Emergency Use Authorization (EUA). This EUA will remain in effect (meaning this test can be used) for the duration of the COVID-19 declaration under Section 564(b)(1) of the Act, 21 U.S.C. section 360bbb-3(b)(1), unless the authorization is terminated or revoked.  Performed at [facility], [place] [address] [place], [place] [address]      Radiology Studies:  No results found.   Scheduled Meds:    aspirin EC  81 mg Oral Daily   atorvastatin  80 mg Oral Daily   cinacalcet  30 mg Oral Q breakfast   heparin  5,000 Units Subcutaneous Q8H   pantoprazole  40 mg Oral Daily   sodium chloride flush  3 mL Intravenous Q12H   zolpidem  5 mg Oral QHS    Continuous Infusions:    piperacillin-tazobactam (ZOSYN)  IV 2.25 g (08/08/21 1400)     LOS: 2 days     Vernell Leep, MD, [place], [facility]. [facility]    To contact the attending provider between 7A-7P or the covering provider during after hours 7P-7A, please log into the web site [email or web address] and access using  universal [place] password for that web site. If you do not have the password, please call the hospital operator.  08/08/2021, 3:33 PM

## 2021-08-08 NOTE — Progress Notes (Signed)
[facility] Progress Note   Subjective:   Patient seen and examined at bedside.  Reports improvement in abdominal pain today.  Supposed to start clear liquid diet.  Denies CP, SOB, edema, and n/v/d.  Dialysis last night tolerated well. Hypoglycemic event overnight.   Objective Vitals:   08/08/21 0305 08/08/21 0350 08/08/21 0353 08/08/21 1052  BP: (!) 103/40  (!) 113/28 97/81  Pulse: 94  97 98  Resp:   16 16  Temp:   98.3 F (36.8 C) 97.7 F (36.5 C)  TempSrc:   Oral Oral  SpO2: 100%   91%  Weight: 62 kg 49.9 kg     Physical Exam General:WD, alert, thin female in NAD Heart:RRR, no mrg Lungs:CTAB, nml WOB on RA, nasal cannula below her neck Abdomen:soft, NTND Extremities:no LE edema Dialysis Access: RU AVF +b/t   Filed Weights   08/07/21 2352 08/08/21 0305 08/08/21 0350  Weight: 62.5 kg 62 kg 49.9 kg    Intake/Output Summary (Last 24 hours) at 08/08/2021 1345 Last data filed at 08/08/2021 0509 Gross per 24 hour  Intake 100.29 ml  Output 500 ml  Net -399.71 ml    Additional Objective Labs: Basic Metabolic Panel: Recent Labs  Lab 08/06/21 0951 08/07/21 0131 08/08/21 0453  NA 138 135 136  K 4.3 4.9 3.9  CL 94* 92* 96*  CO2 27 27 27   GLUCOSE 90 70 57*  BUN 36* 45* 17  CREATININE 6.23* 7.24* 3.79*  CALCIUM 8.3* 8.2* 8.7*  PHOS  --  6.6* 4.2   Liver Function Tests: Recent Labs  Lab 08/06/21 0951 08/07/21 0131 08/08/21 0453  AST 20  --   --   ALT 11  --   --   ALKPHOS 149*  --   --   BILITOT 1.1  --   --   PROT 6.9  --   --   ALBUMIN 3.1* 2.7* 2.5*   Recent Labs  Lab 08/06/21 0951  LIPASE 40   CBC: Recent Labs  Lab 08/06/21 0951 08/07/21 0131 08/08/21 0453  WBC 4.9 15.7* 12.2*  NEUTROABS 3.0  --   --   HGB 12.5 11.6* 11.3*  HCT 40.0 35.1* 34.4*  MCV 97.1 92.9 92.5  PLT 212 216 240   CBG: Recent Labs  Lab 08/08/21 0613 08/08/21 0642  GLUCAP 52* 156*   Studies/Results: CT ABDOMEN PELVIS W CONTRAST  Result Date:  08/06/2021 CLINICAL DATA:  Nausea/vomiting Abdominal pain, acute, nonlocalized EXAM: CT ABDOMEN AND PELVIS WITH CONTRAST TECHNIQUE: Multidetector CT imaging of the abdomen and pelvis was performed using the standard protocol following bolus administration of intravenous contrast. CONTRAST:  157mL OMNIPAQUE IOHEXOL 350 MG/ML SOLN COMPARISON:  07/21/2020 FINDINGS: Lower chest: Dependent bibasilar atelectasis. Cardiomegaly. Coronary artery calcification. Hepatobiliary: No focal liver abnormality is seen. Status post cholecystectomy. No biliary dilatation. Pancreas: Unremarkable. No pancreatic ductal dilatation or surrounding inflammatory changes. Spleen: Normal in size without focal abnormality. Adrenals/Urinary Tract: Unremarkable adrenal glands. Advanced bilateral renal atrophy. Left renal cyst has slightly increased in size from prior now measuring 6.4 cm (previously 5.6 cm). No hydronephrosis. Urinary bladder is decompressed, limiting its evaluation. Stomach/Bowel: Stomach is unremarkable. No dilated loops of bowel. There is a focally thickened short segment of sigmoid colon which contains multiple diverticula. Adjacent pericolonic fat stranding, free fluid, and numerous foci of extraluminal air (series 3, images 38-47). No organized fluid collection or abscess. There is mild long segment thickening throughout the remaining colon. There are multiple thickened appearing loops of small  bowel within the abdomen, likely a reactive enteritis. Vascular/Lymphatic: Advanced atherosclerotic calcifications throughout the aortoiliac axis. 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 No lymphadenopathy. Reproductive: Uterus and bilateral adnexa are unremarkable. Other: Small volume ascites. Chronic soft tissue density in the anterolateral pelvis with scattered internal calcifications measuring approximately 4.9 x 2.4 cm, unchanged, and most  compatible with a chronic hematoma. Musculoskeletal: Chronic changes of renal osteodystrophy. Chronic erosive changes centered at the L4-5 disc, without evidence of progression from prior. Unchanged grade 2 anterolisthesis of L4 on L5. No new or acute bony findings. IMPRESSION: 1. Acute sigmoid diverticulitis with a few adjacent locules of extraluminal air compatible with microperforation. No organized fluid collection or abscess. Surgical consultation recommended. 2. Multiple thickened appearing loops of large and small bowel within the abdomen, likely a reactive enterocolitis. 3. Small volume ascites. 4. 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 5. Chronic changes of renal osteodystrophy. Chronic erosive changes centered at the L4-5 disc, without evidence of progression from prior study. Aortic Atherosclerosis (ICD10-I70.0). These results were called by telephone at the time of interpretation on 08/06/2021 at 3:09 pm to provider Scarlett Presto, MD, who verbally acknowledged these results. Electronically Signed   By: Davina Poke D.O.   On: 08/06/2021 15:12    Medications:  piperacillin-tazobactam (ZOSYN)  IV 2.25 g (08/08/21 0509)    aspirin EC  81 mg Oral Daily   atorvastatin  80 mg Oral Daily   cinacalcet  30 mg Oral Q breakfast   heparin  5,000 Units Subcutaneous Q8H   pantoprazole  40 mg Oral Daily   sodium chloride flush  3 mL Intravenous Q12H   zolpidem  5 mg Oral QHS    Dialysis Orders: [facility] , MWF 4-hour 2K, 2 calcium bath, EDW 49.5  No heparin, RUA AVGG No ESA  ,hgb 11.2, 11.8/Mircera 60 7/18 Sensipar 30 Hectorol 4   Assessment/Plan Sigmoid diverticulitis with microperforation- on IV Zosyn and CCS following.  To start clear liquids today.  ESRD -normal HD MWF, HD yesterday off schedule due to staff shortage and increased patient load.  Plan for next HD on Monday.  Volume and labs stable, monitor over weekend.   Hypertension/volume - BP well controlled.  Continue home meds.  Does not appear volume overloaded. Per weights close to dry post HD. Anemia of ESRD-Hgb 11.3 no ESA needed Metabolic bone disease - corrected Ca and phos in goal.  Continue Hectorol.  Resume sensipar and binders once diet advanced.  History of CVA-on statin and aspirin History of proximal A. Fib/sinus rhythm no anticoagulation. Stable To anxiety on Xanax and BuSpar per admit History of CAD  Jen Mow, PA-C [place] 08/08/2021,1:45 PM  LOS: 2 days

## 2021-08-08 NOTE — Progress Notes (Signed)
Hypoglycemic Event  CBG: 52  Treatment: D50 50 mL (25 gm)  Symptoms: None  Follow-up CBG: [id number] CBG Result:156  Possible Reasons for Event: Other: NPO  Comments/MD notified: [place],NP notified    Benen Weida M Benicio Manna

## 2021-08-09 DIAGNOSIS — I480 Paroxysmal atrial fibrillation: Secondary | ICD-10-CM | POA: Diagnosis not present

## 2021-08-09 DIAGNOSIS — K5720 Diverticulitis of large intestine with perforation and abscess without bleeding: Secondary | ICD-10-CM | POA: Diagnosis not present

## 2021-08-09 DIAGNOSIS — K219 Gastro-esophageal reflux disease without esophagitis: Secondary | ICD-10-CM | POA: Diagnosis not present

## 2021-08-09 DIAGNOSIS — N186 End stage renal disease: Secondary | ICD-10-CM | POA: Diagnosis not present

## 2021-08-09 LAB — RENAL FUNCTION PANEL
Albumin: 2.4 g/dL — ABNORMAL LOW (ref 3.5–5.0)
Anion gap: 13 (ref 5–15)
BUN: 27 mg/dL — ABNORMAL HIGH (ref 8–23)
CO2: 26 mmol/L (ref 22–32)
Calcium: 7.4 mg/dL — ABNORMAL LOW (ref 8.9–10.3)
Chloride: 94 mmol/L — ABNORMAL LOW (ref 98–111)
Creatinine, Ser: 4.91 mg/dL — ABNORMAL HIGH (ref 0.44–1.00)
GFR, Estimated: 9 mL/min — ABNORMAL LOW (ref >60)
Glucose, Bld: 58 mg/dL — ABNORMAL LOW (ref 70–99)
Phosphorus: 2.8 mg/dL (ref 2.5–4.6)
Potassium: 3.9 mmol/L (ref 3.5–5.1)
Sodium: 133 mmol/L — ABNORMAL LOW (ref 135–145)

## 2021-08-09 LAB — CBC
HCT: 32.2 % — ABNORMAL LOW (ref 36.0–46.0)
Hemoglobin: 10.6 g/dL — ABNORMAL LOW (ref 12.0–15.0)
MCH: 30.3 pg (ref 26.0–34.0)
MCHC: 32.9 g/dL (ref 30.0–36.0)
MCV: 92 fL (ref 80.0–100.0)
Platelets: 231 10*3/uL (ref 150–400)
RBC: 3.5 MIL/uL — ABNORMAL LOW (ref 3.87–5.11)
RDW: 15.6 % — ABNORMAL HIGH (ref 11.5–15.5)
WBC: 9 10*3/uL (ref 4.0–10.5)
nRBC: 0 % (ref 0.0–0.2)

## 2021-08-09 LAB — GLUCOSE, CAPILLARY
Glucose-Capillary: 84 mg/dL (ref 70–99)
Glucose-Capillary: 140 mg/dL — ABNORMAL HIGH (ref 70–99)

## 2021-08-09 MED ORDER — DIPHENHYDRAMINE HCL 25 MG PO CAPS
25.0000 mg | ORAL_CAPSULE | Freq: Four times a day (QID) | ORAL | Status: DC | PRN
  Administered 2021-08-09: 25 mg via ORAL
  Filled 2021-08-09: qty 1

## 2021-08-09 MED ORDER — GLUCOSE 40 % PO GEL: 1.0000 | ORAL | Status: AC

## 2021-08-09 NOTE — Plan of Care (Signed)
?  Problem: Clinical Measurements: ?Goal: Ability to maintain clinical measurements within normal limits will improve ?Outcome: Progressing ?Goal: Will remain free from infection ?Outcome: Progressing ?Goal: Diagnostic test results will improve ?Outcome: Progressing ?  ?

## 2021-08-09 NOTE — Progress Notes (Signed)
   Subjective/Chief Complaint: Patient tolerating clear liquids Had several BM - one already this morning Less tender in LLQ WBC normal   Objective: Vital signs in last 24 hours: Temp:  [97.7 F (36.5 C)-98.7 F (37.1 C)] 98.7 F (37.1 C) (08/27 0343) Pulse Rate:  [80-98] 91 (08/27 0343) Resp:  [16-18] 18 (08/27 0343) BP: (97-138)/(46-81) 138/46 (08/27 0343) SpO2:  [91 %-100 %] 92 % (08/27 0343) Last BM Date: 08/08/21  Intake/Output from previous day: 08/26 0701 - 08/27 0700 In: 1363 [P.O.:1160; I.V.:3; IV Piggyback:200] Out: -  Intake/Output this shift: No intake/output data recorded.  General appearance: alert, cooperative, and no distress GI: soft, non-distended;  mild LLQ tenderness  Lab Results:  Recent Labs    08/08/21 0453 08/09/21 0204  WBC 12.2* 9.0  HGB 11.3* 10.6*  HCT 34.4* 32.2*  PLT 240 231   BMET Recent Labs    08/08/21 0453 08/09/21 0204  NA 136 133*  K 3.9 3.9  CL 96* 94*  CO2 27 26  GLUCOSE 57* 58*  BUN 17 27*  CREATININE 3.79* 4.91*  CALCIUM 8.7* 7.4*   PT/INR No results for input(s): LABPROT, INR in the last 72 hours. ABG No results for input(s): PHART, HCO3 in the last 72 hours.  Invalid input(s): PCO2, PO2  Studies/Results: No results found.  Anti-infectives: Anti-infectives (From admission, onward)    Start     Dose/Rate Route Frequency Ordered Stop   08/06/21 2300  piperacillin-tazobactam (ZOSYN) IVPB 2.25 g        2.25 g 100 mL/hr over 30 Minutes Intravenous Every 8 hours 08/06/21 1611     08/06/21 2200  piperacillin-tazobactam (ZOSYN) IVPB 3.375 g  Status:  Discontinued        3.375 g 100 mL/hr over 30 Minutes Intravenous Every 8 hours 08/06/21 1606 08/06/21 1611   08/06/21 1530  piperacillin-tazobactam (ZOSYN) IVPB 3.375 g        3.375 g 100 mL/hr over 30 Minutes Intravenous  Once 08/06/21 1516 08/06/21 1632       Assessment/Plan: Sigmoid diverticulitis with microperforation -cont abx therapy.  WBC  normal -advance to FLD -cont to closely monitor, no acute surgical needs at this time -mobilize/pulm toilet   FEN - FLD/IVFs VTE - heparin ID - zosyn - continue until pain almost resolved, then transition to PO abx  LOS: 3 days    [name] 08/09/2021

## 2021-08-09 NOTE — Progress Notes (Addendum)
Patient blood glucose from blood draw noted at 58 but was never inform by lab of this critical lab value.  Patient is asymptomatic and did not eat dinner per patient.  Gave 4 oz orange juice and recheck CBG=84.  Encourage patient to drink soda or juice if not eating.  On-call [facility] was made aware thru text paged.  Will endorse to day shift RN appropriately.

## 2021-08-09 NOTE — Progress Notes (Signed)
[facility] Progress Note   Subjective:   Patient seen and examined at bedside.  Continues to have abdominal pain but states it is a little better this AM.  Admits to multiple BM.  Denies n/v, CP, SOB, HA, fever and chills.  Tolerated clear liquids and about to try grits.   Objective Vitals:   08/09/21 0017 08/09/21 0343 08/09/21 0925 08/09/21 1105  BP: (!) 124/50 (!) 138/46 110/62 107/70  Pulse: 86 91 87 93  Resp:  18 16 17   Temp: 98.3 F (36.8 C) 98.7 F (37.1 C) 98.1 F (36.7 C) 98.1 F (36.7 C)  TempSrc: Oral Oral Oral Oral  SpO2: 97% 92% 95% 94%  Weight:       Physical Exam General:chronically ill appearing female in NAD Heart:RRR, no mrg Lungs:CTAB, nml WOB Abdomen:soft, ND, mildly tender Extremities:no LE edema Dialysis Access: RU AVF +b/t   Filed Weights   08/07/21 2352 08/08/21 0305 08/08/21 0350  Weight: 62.5 kg 62 kg 49.9 kg    Intake/Output Summary (Last 24 hours) at 08/09/2021 1314 Last data filed at 08/09/2021 1104 Gross per 24 hour  Intake 763 ml  Output --  Net 763 ml    Additional Objective Labs: Basic Metabolic Panel: Recent Labs  Lab 08/07/21 0131 08/08/21 0453 08/09/21 0204  NA 135 136 133*  K 4.9 3.9 3.9  CL 92* 96* 94*  CO2 27 27 26   GLUCOSE 70 57* 58*  BUN 45* 17 27*  CREATININE 7.24* 3.79* 4.91*  CALCIUM 8.2* 8.7* 7.4*  PHOS 6.6* 4.2 2.8   Liver Function Tests: Recent Labs  Lab 08/06/21 0951 08/07/21 0131 08/08/21 0453 08/09/21 0204  AST 20  --   --   --   ALT 11  --   --   --   ALKPHOS 149*  --   --   --   BILITOT 1.1  --   --   --   PROT 6.9  --   --   --   ALBUMIN 3.1* 2.7* 2.5* 2.4*   Recent Labs  Lab 08/06/21 0951  LIPASE 40   CBC: Recent Labs  Lab 08/06/21 0951 08/07/21 0131 08/08/21 0453 08/09/21 0204  WBC 4.9 15.7* 12.2* 9.0  NEUTROABS 3.0  --   --   --   HGB 12.5 11.6* 11.3* 10.6*  HCT 40.0 35.1* 34.4* 32.2*  MCV 97.1 92.9 92.5 92.0  PLT 212 216 240 231   Blood Culture     Component Value Date/Time   SDES BLOOD RIGHT HAND 11/19/2015 0937   SPECREQUEST  11/19/2015 0937    BOTTLES DRAWN AEROBIC AND ANAEROBIC 10CCS BLUE 5CCS RED   CULT NO GROWTH 5 DAYS 11/19/2015 0937   REPTSTATUS 11/24/2015 FINAL 11/19/2015 0937    CBG: Recent Labs  Lab 08/08/21 0613 08/08/21 0642 08/08/21 2040 08/09/21 0529  GLUCAP 52* 156* 80 84    Medications:  piperacillin-tazobactam (ZOSYN)  IV Stopped (08/09/21 0600)    aspirin EC  81 mg Oral Daily   atorvastatin  80 mg Oral Daily   cinacalcet  30 mg Oral Q breakfast   heparin  5,000 Units Subcutaneous Q8H   pantoprazole  40 mg Oral Daily   sodium chloride flush  3 mL Intravenous Q12H   zolpidem  5 mg Oral QHS    Dialysis Orders: [facility] , MWF 4-hour 2K, 2 calcium bath, EDW 49.5  No heparin, RUA AVGG No ESA  ,hgb 11.2, 11.8/Mircera 60 7/18 Sensipar 30 Hectorol 4  Assessment/Plan Sigmoid diverticulitis with microperforation- on IV Zosyn and CCS following.  Advanced to Full liquid diet. ESRD -normal HD MWF, HD off schedule 8/25 due to staff shortage and increased patient load.  Plan for next HD on Monday.  Volume and labs stable, monitor over weekend.  Hypertension/volume - BP well controlled.  Continue home meds.  Does not appear volume overloaded. Per weights close to dry post HD. Anemia of ESRD-Hgb 10.6 no ESA needed for now Metabolic bone disease - corrected Ca and phos in goal.  Continue Hectorol.  Resume sensipar and binders once diet advanced.  History of CVA-on statin and aspirin History of proximal A. Fib/sinus rhythm no anticoagulation. Stable To anxiety on Xanax and BuSpar per admit History of CAD   Jen Mow, PA-C [place] 08/09/2021,1:14 PM  LOS: 3 days

## 2021-08-09 NOTE — Progress Notes (Signed)
PROGRESS NOTE    SAHMYA WIDMARK  ZOX:[id number] DOB: 12-08-1951 DOA: 08/06/2021 PCP: Renford Dills, MD    Brief Narrative:  Mrs. Reil was admitted to the hospital with the working diagnosis of acute sigmoid diverticulitis with microperforation.   70 year old female past medical history for end-stage renal disease on hemodialysis, coronary artery disease, diastolic heart failure and CVA who presented with abdominal pain.  Severe sharp mid epigastric abdominal pain, associated with nausea and intermittent diarrhea.  On her initial physical examination blood pressure 132/60, heart rate 93, respiratory 22, oxygen saturation 96%, her lungs are clear to auscultation bilaterally, heart S1-S2, present, rhythmic, her abdomen was tender to palpation in the lower quadrants, no rebound or guarding, no lower extremity edema.  Sodium 138, potassium 4.3, chloride 94, bicarb 27, glucose 90, BUN 36, creatinine 6.23, white count 4.9, hemoglobin 12.5, hematocrit 40.0, platelets 212. SARS COVID-19 negative.  CT of the abdomen showed acute sigmoid diverticulitis with a few adjacent locules of extraluminal air compatible with microperforation.  No organized fluid collection or abscess.  Thickened appearing loops of large and small bowel, consistent with reactive enterocolitis.  Small volume ascites.  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  EKG 100 bpm, normal axis, normal intervals, sinus rhythm, poor R wave progression, no significant ST segment or T wave changes.  Patient was placed on supportive medical therapy including intravenous fluids and IV antibiotic therapy.  As needed analgesics and antiemetics.  Patient slowly improving, her diet has been advanced.  Assessment & Plan:   Principal Problem:   Diverticulitis of colon with perforation Active Problems:   End-stage renal  disease on hemodialysis (HCC)   Hyperlipidemia   Atrial fibrillation (HCC)   Gastro-esophageal reflux disease without esophagitis   Acute respiratory failure with hypoxia (HCC)  Acute sigmoid diverticulitis complicated with microperforation (no sepsis). P Patient with improvement of her abdominal pain but not yet back to baseline, continue to have pain and poor oral intake. Positive diarrhea.  Wbc is 9,0.   Continue supportive medical therapy with IV fluids, antiacids and as needed analgesics and antiemetics.  Antibiotic therapy with Zosyn. Diet advanced to full liquids.   2. Hypoglycemia fasting glucose this am 58, capillary 52, 156, 80 and 84. Continue to encourage po intake.  Continue with as needed D50, likely poor glycogen storage, will consult nutrition for recommendations Hold on dextrose infusion due to risk of volume overload.   3. ESRD on HD M-W-F, no signs of volume overload. (On admission had volume overload and acute hypoxemic respiratory failure, no resolved).  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 Follow as outpatient.    4. Atrial fibrillation paroxysmal reate control, not on anticoagulation   5. Dyslipidemia continue with atorvastatin   6. GERD. On pantoprazole.   7. Hx of CVA / HTN continue aspirin and atorvastatin, blood pressure control.   8. Depression/ anxiety. Continue with buspirone and alprazolam.   Patient continue to be at high risk for worsening diverticulitis.   Status is: Inpatient  Remains inpatient appropriate because:Inpatient level of care appropriate due to severity of illness  Dispo: The patient is from: Home              Anticipated d/c is to: Home              Patient currently is not medically stable  to d/c.   Difficult to place patient No   DVT prophylaxis: Heparin   Code Status:    full  Family Communication:   No family at the  bedside      Consultants:  Nephrology  Surgery    Antimicrobials:  Zosyn     Subjective: Patient with improvement of her symptoms but not yet back to baseline, continue to have abdominal pain and poor oral intake. Low glucose. Positive diarrhea.   Objective: Vitals:   08/09/21 0017 08/09/21 0343 08/09/21 0925 08/09/21 1105  BP: (!) 124/50 (!) 138/46 110/62 107/70  Pulse: 86 91 87 93  Resp:  18 16 17   Temp: 98.3 F (36.8 C) 98.7 F (37.1 C) 98.1 F (36.7 C) 98.1 F (36.7 C)  TempSrc: Oral Oral Oral Oral  SpO2: 97% 92% 95% 94%  Weight:        Intake/Output Summary (Last 24 hours) at 08/09/2021 1338 Last data filed at 08/09/2021 1104 Gross per 24 hour  Intake 763 ml  Output --  Net 763 ml   Filed Weights   08/07/21 2352 08/08/21 0305 08/08/21 0350  Weight: 62.5 kg 62 kg 49.9 kg    Examination:   General: Not in pain or dyspnea, deconditioned  Neurology: Awake and alert, non focal  E ENT: mild pallor, no icterus, oral mucosa moist Cardiovascular: No JVD. S1-S2 present, rhythmic, no gallops, rubs, or murmurs. No lower extremity edema. Pulmonary: positive breath sounds bilaterally, adequate air movement, no wheezing, rhonchi or rales. Gastrointestinal. Abdomen mild distended but not tender to superficial palpation Skin. No rashes Musculoskeletal: no joint deformities     Data Reviewed: I have personally reviewed following labs and imaging studies  CBC: Recent Labs  Lab 08/06/21 0951 08/07/21 0131 08/08/21 0453 08/09/21 0204  WBC 4.9 15.7* 12.2* 9.0  NEUTROABS 3.0  --   --   --   HGB 12.5 11.6* 11.3* 10.6*  HCT 40.0 35.1* 34.4* 32.2*  MCV 97.1 92.9 92.5 92.0  PLT 212 216 240 231   Basic Metabolic Panel: Recent Labs  Lab 08/06/21 0951 08/07/21 0131 08/08/21 0453 08/09/21 0204  NA 138 135 136 133*  K 4.3 4.9 3.9 3.9  CL 94* 92* 96* 94*  CO2 27 27 27 26   GLUCOSE 90 70 57* 58*  BUN 36* 45* 17 27*  CREATININE 6.23* 7.24* 3.79* 4.91*  CALCIUM  8.3* 8.2* 8.7* 7.4*  PHOS  --  6.6* 4.2 2.8   GFR: Estimated Creatinine Clearance: 8.4 mL/min (A) (by C-G formula based on SCr of 4.91 mg/dL (H)). Liver Function Tests: Recent Labs  Lab 08/06/21 0951 08/07/21 0131 08/08/21 0453 08/09/21 0204  AST 20  --   --   --   ALT 11  --   --   --   ALKPHOS 149*  --   --   --   BILITOT 1.1  --   --   --   PROT 6.9  --   --   --   ALBUMIN 3.1* 2.7* 2.5* 2.4*   Recent Labs  Lab 08/06/21 0951  LIPASE 40   No results for input(s): AMMONIA in the last 168 hours. Coagulation Profile: No results for input(s): INR, PROTIME in the last 168 hours. Cardiac Enzymes: No results for input(s): CKTOTAL, CKMB, CKMBINDEX, TROPONINI in the last 168 hours. BNP (last 3 results) No results for input(s): PROBNP in the last 8760 hours. HbA1C: No results for input(s): HGBA1C in the last 72 hours. CBG: Recent Labs  Lab 08/08/21 0613 08/08/21 0642 08/08/21 2040 08/09/21 0529  GLUCAP 52* 156* 80 84   Lipid Profile: No results for input(s): CHOL, HDL, LDLCALC, TRIG, CHOLHDL, LDLDIRECT in the last 72 hours. Thyroid Function Tests: No results for input(s): TSH, T4TOTAL, FREET4, T3FREE, THYROIDAB in the last 72 hours. Anemia Panel: No results for input(s): VITAMINB12, FOLATE, FERRITIN, TIBC, IRON, RETICCTPCT in the last 72 hours.    Radiology Studies: I have reviewed all of the imaging during this hospital visit personally     Scheduled Meds:  aspirin EC  81 mg Oral Daily   atorvastatin  80 mg Oral Daily   cinacalcet  30 mg Oral Q breakfast   heparin  5,000 Units Subcutaneous Q8H   pantoprazole  40 mg Oral Daily   sodium chloride flush  3 mL Intravenous Q12H   zolpidem  5 mg Oral QHS   Continuous Infusions:  piperacillin-tazobactam (ZOSYN)  IV Stopped (08/09/21 0600)     LOS: 3 days        Welcome Fults Annett Gula, MD

## 2021-08-10 ENCOUNTER — Inpatient Hospital Stay (HOSPITAL_COMMUNITY): Payer: Medicare HMO

## 2021-08-10 DIAGNOSIS — K5720 Diverticulitis of large intestine with perforation and abscess without bleeding: Secondary | ICD-10-CM | POA: Diagnosis not present

## 2021-08-10 DIAGNOSIS — K219 Gastro-esophageal reflux disease without esophagitis: Secondary | ICD-10-CM | POA: Diagnosis not present

## 2021-08-10 DIAGNOSIS — I480 Paroxysmal atrial fibrillation: Secondary | ICD-10-CM | POA: Diagnosis not present

## 2021-08-10 DIAGNOSIS — J9601 Acute respiratory failure with hypoxia: Secondary | ICD-10-CM | POA: Diagnosis not present

## 2021-08-10 LAB — CBC
HCT: 32.5 % — ABNORMAL LOW (ref 36.0–46.0)
Hemoglobin: 10.7 g/dL — ABNORMAL LOW (ref 12.0–15.0)
MCH: 30.1 pg (ref 26.0–34.0)
MCHC: 32.9 g/dL (ref 30.0–36.0)
MCV: 91.5 fL (ref 80.0–100.0)
Platelets: 217 10*3/uL (ref 150–400)
RBC: 3.55 MIL/uL — ABNORMAL LOW (ref 3.87–5.11)
RDW: 15.7 % — ABNORMAL HIGH (ref 11.5–15.5)
WBC: 11.6 10*3/uL — ABNORMAL HIGH (ref 4.0–10.5)
nRBC: 0 % (ref 0.0–0.2)

## 2021-08-10 LAB — BASIC METABOLIC PANEL
Anion gap: 14 (ref 5–15)
BUN: 35 mg/dL — ABNORMAL HIGH (ref 8–23)
CO2: 23 mmol/L (ref 22–32)
Calcium: 7.5 mg/dL — ABNORMAL LOW (ref 8.9–10.3)
Chloride: 94 mmol/L — ABNORMAL LOW (ref 98–111)
Creatinine, Ser: 6.05 mg/dL — ABNORMAL HIGH (ref 0.44–1.00)
GFR, Estimated: 7 mL/min — ABNORMAL LOW (ref >60)
Glucose, Bld: 112 mg/dL — ABNORMAL HIGH (ref 70–99)
Potassium: 3.7 mmol/L (ref 3.5–5.1)
Sodium: 131 mmol/L — ABNORMAL LOW (ref 135–145)

## 2021-08-10 MED ORDER — CHLORHEXIDINE GLUCONATE CLOTH 2 % EX PADS
6.0000 | MEDICATED_PAD | Freq: Every day | CUTANEOUS | Status: DC
  Administered 2021-08-11 – 2021-08-20 (×7): 6 via TOPICAL

## 2021-08-10 MED ORDER — IOHEXOL 9 MG/ML PO SOLN
500.0000 mL | ORAL | Status: AC
  Administered 2021-08-10 (×2): 500 mL via ORAL

## 2021-08-10 MED ORDER — IOHEXOL 350 MG/ML SOLN
75.0000 mL | Freq: Once | INTRAVENOUS | Status: AC | PRN
  Administered 2021-08-10: 75 mL via INTRAVENOUS

## 2021-08-10 NOTE — Progress Notes (Signed)
Radiology report on CT with new free intraperitoneal air indicating bowel perforation, possible small bowel.  Plan: Change to NPO Continue IV zosyn for antibiotic therapy. Informed surgery team about CT report.

## 2021-08-10 NOTE — Progress Notes (Signed)
   Subjective/Chief Complaint: Several episodes of diarrhea Still with some lower abdominal pain WBC increasing Dialysis MWF   Objective: Vital signs in last 24 hours: Temp:  [97.6 F (36.4 C)-98.2 F (36.8 C)] 98 F (36.7 C) (08/28 0423) Pulse Rate:  [88-104] 103 (08/28 0423) Resp:  [16-17] 17 (08/28 0423) BP: (107-150)/(35-70) 133/46 (08/28 0423) SpO2:  [94 %-100 %] 96 % (08/28 0423) Last BM Date: 08/09/21  Intake/Output from previous day: 08/27 0701 - 08/28 0700 In: 120 [P.O.:120] Out: -  Intake/Output this shift: No intake/output data recorded.  General appearance: alert, cooperative, and no distress GI: soft, tender in both lower quadrants L>R No palpable masses  Lab Results:  Recent Labs    08/09/21 0204 08/10/21 0042  WBC 9.0 11.6*  HGB 10.6* 10.7*  HCT 32.2* 32.5*  PLT 231 217   BMET Recent Labs    08/09/21 0204 08/10/21 0042  NA 133* 131*  K 3.9 3.7  CL 94* 94*  CO2 26 23  GLUCOSE 58* 112*  BUN 27* 35*  CREATININE 4.91* 6.05*  CALCIUM 7.4* 7.5*   PT/INR No results for input(s): LABPROT, INR in the last 72 hours. ABG No results for input(s): PHART, HCO3 in the last 72 hours.  Invalid input(s): PCO2, PO2  Studies/Results: No results found.  Anti-infectives: Anti-infectives (From admission, onward)    Start     Dose/Rate Route Frequency Ordered Stop   08/06/21 2300  piperacillin-tazobactam (ZOSYN) IVPB 2.25 g        2.25 g 100 mL/hr over 30 Minutes Intravenous Every 8 hours 08/06/21 1611     08/06/21 2200  piperacillin-tazobactam (ZOSYN) IVPB 3.375 g  Status:  Discontinued        3.375 g 100 mL/hr over 30 Minutes Intravenous Every 8 hours 08/06/21 1606 08/06/21 1611   08/06/21 1530  piperacillin-tazobactam (ZOSYN) IVPB 3.375 g        3.375 g 100 mL/hr over 30 Minutes Intravenous  Once 08/06/21 1516 08/06/21 1632       Assessment/Plan: Sigmoid diverticulitis with microperforation -cont abx therapy.  WBC increasing -CT scan  today to rule out abscess;  will obtain with IV contrast since patient is already on dialysis -cont to closely monitor, no acute surgical needs at this time -mobilize/pulm toilet   FEN - FLD/IVFs VTE - heparin ID - zosyn   LOS: 4 days    Maia Petties 08/10/2021

## 2021-08-10 NOTE — Progress Notes (Addendum)
[facility] Progress Note   Subjective:   Patient seen and examined at bedside.  Complains of abdominal bloating and multiple episodes of diarrhea.  Denies CP, SOB, edema, orthopnea, n/v, weakness and fatigue.   Objective Vitals:   08/09/21 2015 08/10/21 0016 08/10/21 0423 08/10/21 1155  BP: (!) 150/45 (!) 133/45 (!) 133/46 132/69  Pulse: 88 (!) 104 (!) 103 (!) 101  Resp: 17 17 17 16   Temp: 98.2 F (36.8 C) 97.6 F (36.4 C) 98 F (36.7 C) (!) 97.5 F (36.4 C)  TempSrc: Oral Oral Oral Oral  SpO2: 96% 94% 96% 99%  Weight:       Physical Exam General:chroncially ill appearing female in NAD Heart:RRR, no mrg Lungs:mostly CTAB, +crackles in LLL, nml WOB on RA Abdomen:+distended, +tenderness, +BS Extremities:no LE edema Dialysis Access: RU AVF +b/t   Filed Weights   08/07/21 2352 08/08/21 0305 08/08/21 0350  Weight: 62.5 kg 62 kg 49.9 kg    Intake/Output Summary (Last 24 hours) at 08/10/2021 1238 Last data filed at 08/10/2021 0830 Gross per 24 hour  Intake 350 ml  Output --  Net 350 ml    Additional Objective Labs: Basic Metabolic Panel: Recent Labs  Lab 08/07/21 0131 08/08/21 0453 08/09/21 0204 08/10/21 0042  NA 135 136 133* 131*  K 4.9 3.9 3.9 3.7  CL 92* 96* 94* 94*  CO2 27 27 26 23   GLUCOSE 70 57* 58* 112*  BUN 45* 17 27* 35*  CREATININE 7.24* 3.79* 4.91* 6.05*  CALCIUM 8.2* 8.7* 7.4* 7.5*  PHOS 6.6* 4.2 2.8  --    Liver Function Tests: Recent Labs  Lab 08/06/21 0951 08/07/21 0131 08/08/21 0453 08/09/21 0204  AST 20  --   --   --   ALT 11  --   --   --   ALKPHOS 149*  --   --   --   BILITOT 1.1  --   --   --   PROT 6.9  --   --   --   ALBUMIN 3.1* 2.7* 2.5* 2.4*   Recent Labs  Lab 08/06/21 0951  LIPASE 40   CBC: Recent Labs  Lab 08/06/21 0951 08/07/21 0131 08/08/21 0453 08/09/21 0204 08/10/21 0042  WBC 4.9 15.7* 12.2* 9.0 11.6*  NEUTROABS 3.0  --   --   --   --   HGB 12.5 11.6* 11.3* 10.6* 10.7*  HCT 40.0 35.1*  34.4* 32.2* 32.5*  MCV 97.1 92.9 92.5 92.0 91.5  PLT 212 216 240 231 217   Blood Culture    Component Value Date/Time   SDES BLOOD RIGHT HAND 11/19/2015 0937   SPECREQUEST  11/19/2015 0937    BOTTLES DRAWN AEROBIC AND ANAEROBIC 10CCS BLUE 5CCS RED   CULT NO GROWTH 5 DAYS 11/19/2015 0937   REPTSTATUS 11/24/2015 FINAL 11/19/2015 0937   CBG: Recent Labs  Lab 08/08/21 0613 08/08/21 0642 08/08/21 2040 08/09/21 0529 08/09/21 1547  GLUCAP 52* 156* 80 84 140*   Medications:  piperacillin-tazobactam (ZOSYN)  IV 2.25 g (08/10/21 0656)    aspirin EC  81 mg Oral Daily   atorvastatin  80 mg Oral Daily   cinacalcet  30 mg Oral Q breakfast   dextrose  1 Tube Oral STAT   heparin  5,000 Units Subcutaneous Q8H   pantoprazole  40 mg Oral Daily   sodium chloride flush  3 mL Intravenous Q12H   zolpidem  5 mg Oral QHS    Dialysis Orders: [facility] ,  MWF 4-hour 2K, 2 calcium bath, EDW 49.5  No heparin, RUA AVGG No ESA  ,hgb 11.2, 11.8/Mircera 60 7/18 Sensipar 30 Hectorol 4   Assessment/Plan Sigmoid diverticulitis with microperforation- on IV Zosyn and CCS following.  WBC trending up. ESRD -normal HD MWF, HD off schedule 8/25 due to staff shortage and increased patient load.  Plan for next HD tomorrow per regular schedule.   Hypertension/volume - BP well controlled.  Continue home meds.  Does not appear volume overloaded. Per weights close to dry post HD. Anemia of ESRD-Hgb 10.7 no ESA needed for now Metabolic bone disease - corrected Ca and phos in goal.  Continue Hectorol.  Resume sensipar and binders once diet advanced.  History of CVA-on statin and aspirin History of proximal A. Fib/sinus rhythm no anticoagulation. Stable To anxiety on Xanax and BuSpar per admit History of CAD   Jen Mow, PA-C [place] 08/10/2021,12:38 PM  LOS: 4 days

## 2021-08-10 NOTE — Progress Notes (Signed)
PROGRESS NOTE    [name]  HYQ:[id number] DOB: 1951-12-09 DOA: 08/06/2021 PCP: Renford Dills, MD    Brief Narrative:  [name] was admitted to the hospital with the working diagnosis of acute sigmoid diverticulitis with microperforation.    70 year old female past medical history for end-stage renal disease on hemodialysis, coronary artery disease, diastolic heart failure and CVA who presented with abdominal pain.  Severe sharp mid epigastric abdominal pain, associated with nausea and intermittent diarrhea.  On her initial physical examination blood pressure 132/60, heart rate 93, respiratory 22, oxygen saturation 96%, her lungs are clear to auscultation bilaterally, heart S1-S2, present, rhythmic, her abdomen was tender to palpation in the lower quadrants, no rebound or guarding, no lower extremity edema.   Sodium 138, potassium 4.3, chloride 94, bicarb 27, glucose 90, BUN 36, creatinine 6.23, white count 4.9, hemoglobin 12.5, hematocrit 40.0, platelets 212. SARS COVID-19 negative.   CT of the abdomen showed acute sigmoid diverticulitis with a few adjacent locules of extraluminal air compatible with microperforation.  No organized fluid collection or abscess.  Thickened appearing loops of large and small bowel, consistent with reactive enterocolitis.  Small volume ascites.  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   EKG 100 bpm, normal axis, normal intervals, sinus rhythm, poor R wave progression, no significant ST segment or T wave changes.   Patient was placed on supportive medical therapy including intravenous fluids and IV antibiotic therapy.  As needed analgesics and antiemetics.   Patient slowly improving, her diet has been advanced.  Because worsening leukocytosis abdominal pelvis CT was ordered.    Assessment & Plan:   Principal Problem:    Diverticulitis of colon with perforation Active Problems:   End-stage renal disease on hemodialysis (HCC)   Hyperlipidemia   Atrial fibrillation (HCC)   Gastro-esophageal reflux disease without esophagitis   Acute respiratory failure with hypoxia (HCC)   Acute sigmoid diverticulitis complicated with microperforation (no sepsis).  Continue to have loose stools, abdominal pain has been stable, her wbc was increased to 11.6   Plan to get CT abdomen and pelvis today with contrast.  Continue antibiotic therapy with IV Zosyn, as needed analgesics and antiacids.  Continue with full liquid diet.    2. Hypoglycemia  Fasting glucose this am up to 112, capillary 84 and 140, will continue with hypoglycemia protocol. Continue to encourage po intake.     3. ESRD on HD M-W-F, hyponatremia no signs of volume overload. (On admission had volume overload and acute hypoxemic respiratory failure, now resolved).  Anemia of chronic renal disease, stable hgb and hct Na is 131 with K at 3,7 and serum bicarbonate at 23.    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 Follow as outpatient.  Plan for renal replacement therapy in am.     4. Atrial fibrillation paroxysmal continue rate control. Patient not on anticoagulation    5. Dyslipidemia On atorvastatin    6. GERD. Continue with pantoprazole.    7. Hx of CVA / HTN On aspirin and atorvastatin.    8. Depression/ anxiety. On buspirone and alprazolam.    Patient continue to be at high risk for worsening diverticulitis.   Status is: Inpatient  Remains inpatient appropriate because:Inpatient level of care appropriate due to severity of illness  Dispo: The patient is from: Home              Anticipated d/c  is to: Home              Patient currently is not medically stable to d/c.   Difficult to place patient No   DVT prophylaxis: Heparin   Code Status:    full  Family Communication:   No family at the bedside      Consultants:  General surgery   Antimicrobials:  Zosyn     Subjective: Patient continue to have diarrhea, and abdominal pain, no nausea or vomiting, no chest pain or dyspnea.   Objective: Vitals:   08/09/21 2015 08/10/21 0016 08/10/21 0423 08/10/21 1155  BP: (!) 150/45 (!) 133/45 (!) 133/46 132/69  Pulse: 88 (!) 104 (!) 103 (!) 101  Resp: 17 17 17 16   Temp: 98.2 F (36.8 C) 97.6 F (36.4 C) 98 F (36.7 C) (!) 97.5 F (36.4 C)  TempSrc: Oral Oral Oral Oral  SpO2: 96% 94% 96% 99%  Weight:        Intake/Output Summary (Last 24 hours) at 08/10/2021 1222 Last data filed at 08/10/2021 0830 Gross per 24 hour  Intake 350 ml  Output --  Net 350 ml   Filed Weights   08/07/21 2352 08/08/21 0305 08/08/21 0350  Weight: 62.5 kg 62 kg 49.9 kg    Examination:   General: Not in pain or dyspnea, deconditioned  Neurology: Awake and alert, non focal  E ENT: mild pallor, no icterus, oral mucosa moist Cardiovascular: No JVD. S1-S2 present, rhythmic, no gallops, rubs, or murmurs. No lower extremity edema. Pulmonary: positive breath sounds bilaterally, adequate air movement, no wheezing, rhonchi or rales. Gastrointestinal. Abdomen mild distended but not tender to superficial palpation Skin. No rashes Musculoskeletal: no joint deformities     Data Reviewed: I have personally reviewed following labs and imaging studies  CBC: Recent Labs  Lab 08/06/21 0951 08/07/21 0131 08/08/21 0453 08/09/21 0204 08/10/21 0042  WBC 4.9 15.7* 12.2* 9.0 11.6*  NEUTROABS 3.0  --   --   --   --   HGB 12.5 11.6* 11.3* 10.6* 10.7*  HCT 40.0 35.1* 34.4* 32.2* 32.5*  MCV 97.1 92.9 92.5 92.0 91.5  PLT 212 216 240 231 217   Basic Metabolic Panel: Recent Labs  Lab 08/06/21 0951 08/07/21 0131 08/08/21 0453 08/09/21 0204 08/10/21 0042  NA 138 135 136 133* 131*  K 4.3 4.9 3.9 3.9 3.7  CL 94* 92* 96* 94* 94*  CO2 27 27 27 26 23   GLUCOSE 90 70 57* 58* 112*  BUN 36* 45* 17 27* 35*   CREATININE 6.23* 7.24* 3.79* 4.91* 6.05*  CALCIUM 8.3* 8.2* 8.7* 7.4* 7.5*  PHOS  --  6.6* 4.2 2.8  --    GFR: Estimated Creatinine Clearance: 6.8 mL/min (A) (by C-G formula based on SCr of 6.05 mg/dL (H)). Liver Function Tests: Recent Labs  Lab 08/06/21 0951 08/07/21 0131 08/08/21 0453 08/09/21 0204  AST 20  --   --   --   ALT 11  --   --   --   ALKPHOS 149*  --   --   --   BILITOT 1.1  --   --   --   PROT 6.9  --   --   --   ALBUMIN 3.1* 2.7* 2.5* 2.4*   Recent Labs  Lab 08/06/21 0951  LIPASE 40   No results for input(s): AMMONIA in the last 168 hours. Coagulation Profile: No results for input(s): INR, PROTIME in the last 168 hours. Cardiac Enzymes: No results  for input(s): CKTOTAL, CKMB, CKMBINDEX, TROPONINI in the last 168 hours. BNP (last 3 results) No results for input(s): PROBNP in the last 8760 hours. HbA1C: No results for input(s): HGBA1C in the last 72 hours. CBG: Recent Labs  Lab 08/08/21 0613 08/08/21 0642 08/08/21 2040 08/09/21 0529 08/09/21 1547  GLUCAP 52* 156* 80 84 140*   Lipid Profile: No results for input(s): CHOL, HDL, LDLCALC, TRIG, CHOLHDL, LDLDIRECT in the last 72 hours. Thyroid Function Tests: No results for input(s): TSH, T4TOTAL, FREET4, T3FREE, THYROIDAB in the last 72 hours. Anemia Panel: No results for input(s): VITAMINB12, FOLATE, FERRITIN, TIBC, IRON, RETICCTPCT in the last 72 hours.    Radiology Studies: I have reviewed all of the imaging during this hospital visit personally     Scheduled Meds:  aspirin EC  81 mg Oral Daily   atorvastatin  80 mg Oral Daily   cinacalcet  30 mg Oral Q breakfast   dextrose  1 Tube Oral STAT   heparin  5,000 Units Subcutaneous Q8H   iohexol  500 mL Oral Q1H   pantoprazole  40 mg Oral Daily   sodium chloride flush  3 mL Intravenous Q12H   zolpidem  5 mg Oral QHS   Continuous Infusions:  piperacillin-tazobactam (ZOSYN)  IV 2.25 g (08/10/21 0656)     LOS: 4 days         Eartha Vonbehren Annett Gula, MD

## 2021-08-10 NOTE — Progress Notes (Signed)
Patient ID: [name], female   DOB: 07-29-1951, 70 y.o.   MRN: [id number] CT A/P reviewed. It now shows a few dots of free air. There is some thickened small bowel on the L side. She denies abdominal pain but does have some L side tenderness on exam. No peritonitis. I think the SB thickening is reactive to the diverticulitis with microperf. NPO, Zosyn, and we will follow.  Georganna Skeans, MD, MPH, FACS Please use [email or web address] to contact on call provider

## 2021-08-11 DIAGNOSIS — K5720 Diverticulitis of large intestine with perforation and abscess without bleeding: Secondary | ICD-10-CM | POA: Diagnosis not present

## 2021-08-11 DIAGNOSIS — J9601 Acute respiratory failure with hypoxia: Secondary | ICD-10-CM | POA: Diagnosis not present

## 2021-08-11 DIAGNOSIS — N186 End stage renal disease: Secondary | ICD-10-CM | POA: Diagnosis not present

## 2021-08-11 DIAGNOSIS — I480 Paroxysmal atrial fibrillation: Secondary | ICD-10-CM | POA: Diagnosis not present

## 2021-08-11 LAB — BASIC METABOLIC PANEL
Anion gap: 17 — ABNORMAL HIGH (ref 5–15)
BUN: 41 mg/dL — ABNORMAL HIGH (ref 8–23)
CO2: 22 mmol/L (ref 22–32)
Calcium: 7.3 mg/dL — ABNORMAL LOW (ref 8.9–10.3)
Chloride: 88 mmol/L — ABNORMAL LOW (ref 98–111)
Creatinine, Ser: 6.86 mg/dL — ABNORMAL HIGH (ref 0.44–1.00)
GFR, Estimated: 6 mL/min — ABNORMAL LOW (ref >60)
Glucose, Bld: 76 mg/dL (ref 70–99)
Potassium: 3.7 mmol/L (ref 3.5–5.1)
Sodium: 127 mmol/L — ABNORMAL LOW (ref 135–145)

## 2021-08-11 LAB — CBC
HCT: 30.2 % — ABNORMAL LOW (ref 36.0–46.0)
Hemoglobin: 10.2 g/dL — ABNORMAL LOW (ref 12.0–15.0)
MCH: 30.1 pg (ref 26.0–34.0)
MCHC: 33.8 g/dL (ref 30.0–36.0)
MCV: 89.1 fL (ref 80.0–100.0)
Platelets: 249 10*3/uL (ref 150–400)
RBC: 3.39 MIL/uL — ABNORMAL LOW (ref 3.87–5.11)
RDW: 15.7 % — ABNORMAL HIGH (ref 11.5–15.5)
WBC: 8.2 10*3/uL (ref 4.0–10.5)
nRBC: 0 % (ref 0.0–0.2)

## 2021-08-11 MED ORDER — HYDROMORPHONE HCL 1 MG/ML IJ SOLN
1.0000 mg | INTRAMUSCULAR | Status: DC | PRN
  Administered 2021-08-11 – 2021-08-12 (×3): 1 mg via INTRAVENOUS
  Filled 2021-08-11 (×3): qty 1

## 2021-08-11 MED ORDER — ALBUMIN HUMAN 25 % IV SOLN
INTRAVENOUS | Status: AC
  Administered 2021-08-11: 50 g
  Filled 2021-08-11: qty 200

## 2021-08-11 NOTE — Progress Notes (Signed)
PROGRESS NOTE    [name]  QMV:[id number] DOB: [birth year] DOA: 08/06/2021 PCP: Renford Dills, MD    Brief Narrative:  [name] was admitted to the hospital with the working diagnosis of acute sigmoid diverticulitis with microperforation.    70 year old female past medical history for end-stage renal disease on hemodialysis, coronary artery disease, diastolic heart failure and CVA who presented with abdominal pain.  Severe sharp mid epigastric abdominal pain, associated with nausea and intermittent diarrhea.  On her initial physical examination blood pressure 132/60, heart rate 93, respiratory 22, oxygen saturation 96%, her lungs are clear to auscultation bilaterally, heart S1-S2, present, rhythmic, her abdomen was tender to palpation in the lower quadrants, no rebound or guarding, no lower extremity edema.   Sodium 138, potassium 4.3, chloride 94, bicarb 27, glucose 90, BUN 36, creatinine 6.23, white count 4.9, hemoglobin 12.5, hematocrit 40.0, platelets 212. SARS COVID-19 negative.   CT of the abdomen showed acute sigmoid diverticulitis with a few adjacent locules of extraluminal air compatible with microperforation.  No organized fluid collection or abscess.  Thickened appearing loops of large and small bowel, consistent with reactive enterocolitis.  Small volume ascites.  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   EKG 100 bpm, normal axis, normal intervals, sinus rhythm, poor R wave progression, no significant ST segment or T wave changes.   Patient was placed on supportive medical therapy including intravenous fluids and IV antibiotic therapy.  As needed analgesics and antiemetics.   Patient slowly improving, her diet has been advanced.   Because worsening leukocytosis abdominal pelvis CT was ordered. Resulted in free air in the peritoneum indicating  bowel perforation.   Clinically slowly improving, plan to continue supportive care including IV antibiotic therapy.   Assessment & Plan:   Principal Problem:   Diverticulitis of colon with perforation Active Problems:   End-stage renal disease on hemodialysis (HCC)   Hyperlipidemia   Atrial fibrillation (HCC)   Gastro-esophageal reflux disease without esophagitis   Acute respiratory failure with hypoxia (HCC)   Acute sigmoid diverticulitis complicated with microperforation (no sepsis).  CT abdomen and pelvis with new free air indicating bowel perforation, possible associated with bowel ischemia.  This am patient with improved abdominal pain but continue to have diarrhea. Wbc is 8,2 and she has been afebrile.    Plan to continue with antibiotic therapy with IV Zosyn. Pain control with hydromorphone. Antiacid therapy with pantoprazole. For now sips of clears and follow with surgery recommendations.    2. Hypoglycemia  Continue glucose cover and monitoring. Fasting glucose this am 76. On IV dextrose as needed per hypoglycemia protocol.   3. ESRD on HD M-W-F, hyponatremia no signs of volume overload. (On admission had volume overload and acute hypoxemic respiratory failure, now resolved).  Anemia of chronic renal disease, stable hgb and hct Left renal artery aneurysm, stable. Follow as outpatient.   Patient had HD today with good toleration, pre HD Na continue to be low at 127 with K at 3,7 and serum bicarbonate at 22.  Follow electrolytes in am.  Continue with sensipar for metabolic bone disease.    4. Atrial fibrillation paroxysmal remains well rate controlled, continue to hold on anticoagulation in the setting of acute bowel perforation.    5. Dyslipidemia Continue with atorvastatin    6. GERD. On pantoprazole.    7. Hx of CVA / HTN Continue with  aspirin and atorvastatin.  8. Depression/ anxiety. Continue with buspirone and alprazolam.    Patient continue to be at  high risk for worsening bowel perforation   Status is: Inpatient  Remains inpatient appropriate because:Inpatient level of care appropriate due to severity of illness  Dispo: The patient is from: Home              Anticipated d/c is to: Home              Patient currently is not medically stable to d/c.   Difficult to place patient No   DVT prophylaxis: Scd   Code Status:    full  Family Communication:   No family at the bedside      Consultants:  Surgery     Antimicrobials:  IV Zosyn    Subjective: Patient reports improvement in her abdominal pain, no nausea or vomiting, no dyspnea or chest pain. Today had HD with good toleration.   Objective: Vitals:   08/11/21 1000 08/11/21 1030 08/11/21 1100 08/11/21 1137  BP: (!) 95/56 (!) 116/56 (!) 122/52 (!) 117/47  Pulse: 86 87 89 83  Resp: 15 17  17   Temp:    (!) 97 F (36.1 C)  TempSrc:    Oral  SpO2:    99%  Weight:    51.2 kg    Intake/Output Summary (Last 24 hours) at 08/11/2021 1241 Last data filed at 08/11/2021 1126 Gross per 24 hour  Intake 290 ml  Output 1002 ml  Net -712 ml   Filed Weights   08/08/21 0350 08/11/21 0807 08/11/21 1137  Weight: 49.9 kg 52.2 kg 51.2 kg    Examination:   General: Not in pain or dyspnea, deconditioned  Neurology: Awake and alert, non focal  E ENT: mild pallor, no icterus, oral mucosa moist Cardiovascular: No JVD. S1-S2 present, rhythmic, no gallops, rubs, or murmurs. No lower extremity edema. Pulmonary: positive breath sounds bilaterally, adequate air movement, no wheezing, rhonchi or rales. Gastrointestinal. Abdomen mild distended but soft, non tender to superficial palpation,  Skin. No rashes Musculoskeletal: no joint deformities     Data Reviewed: I have personally reviewed following labs and imaging studies  CBC: Recent Labs  Lab 08/06/21 0951 08/07/21 0131 08/08/21 0453 08/09/21 0204 08/10/21 0042 08/11/21 0359  WBC 4.9 15.7* 12.2* 9.0 11.6* 8.2   NEUTROABS 3.0  --   --   --   --   --   HGB 12.5 11.6* 11.3* 10.6* 10.7* 10.2*  HCT 40.0 35.1* 34.4* 32.2* 32.5* 30.2*  MCV 97.1 92.9 92.5 92.0 91.5 89.1  PLT 212 216 240 231 217 249   Basic Metabolic Panel: Recent Labs  Lab 08/07/21 0131 08/08/21 0453 08/09/21 0204 08/10/21 0042 08/11/21 0359  NA 135 136 133* 131* 127*  K 4.9 3.9 3.9 3.7 3.7  CL 92* 96* 94* 94* 88*  CO2 27 27 26 23 22   GLUCOSE 70 57* 58* 112* 76  BUN 45* 17 27* 35* 41*  CREATININE 7.24* 3.79* 4.91* 6.05* 6.86*  CALCIUM 8.2* 8.7* 7.4* 7.5* 7.3*  PHOS 6.6* 4.2 2.8  --   --    GFR: Estimated Creatinine Clearance: 6 mL/min (A) (by C-G formula based on SCr of 6.86 mg/dL (H)). Liver Function Tests: Recent Labs  Lab 08/06/21 0951 08/07/21 0131 08/08/21 0453 08/09/21 0204  AST 20  --   --   --   ALT 11  --   --   --   ALKPHOS 149*  --   --   --  BILITOT 1.1  --   --   --   PROT 6.9  --   --   --   ALBUMIN 3.1* 2.7* 2.5* 2.4*   Recent Labs  Lab 08/06/21 0951  LIPASE 40   No results for input(s): AMMONIA in the last 168 hours. Coagulation Profile: No results for input(s): INR, PROTIME in the last 168 hours. Cardiac Enzymes: No results for input(s): CKTOTAL, CKMB, CKMBINDEX, TROPONINI in the last 168 hours. BNP (last 3 results) No results for input(s): PROBNP in the last 8760 hours. HbA1C: No results for input(s): HGBA1C in the last 72 hours. CBG: Recent Labs  Lab 08/08/21 0613 08/08/21 0642 08/08/21 2040 08/09/21 0529 08/09/21 1547  GLUCAP 52* 156* 80 84 140*   Lipid Profile: No results for input(s): CHOL, HDL, LDLCALC, TRIG, CHOLHDL, LDLDIRECT in the last 72 hours. Thyroid Function Tests: No results for input(s): TSH, T4TOTAL, FREET4, T3FREE, THYROIDAB in the last 72 hours. Anemia Panel: No results for input(s): VITAMINB12, FOLATE, FERRITIN, TIBC, IRON, RETICCTPCT in the last 72 hours.    Radiology Studies: I have reviewed all of the imaging during this hospital visit  personally     Scheduled Meds:  aspirin EC  81 mg Oral Daily   atorvastatin  80 mg Oral Daily   Chlorhexidine Gluconate Cloth  6 each Topical Q0600   cinacalcet  30 mg Oral Q breakfast   heparin  5,000 Units Subcutaneous Q8H   pantoprazole  40 mg Oral Daily   sodium chloride flush  3 mL Intravenous Q12H   zolpidem  5 mg Oral QHS   Continuous Infusions:  piperacillin-tazobactam (ZOSYN)  IV 2.25 g (08/11/21 0507)     LOS: 5 days        Dafina Suk Annett Gula, MD

## 2021-08-11 NOTE — Progress Notes (Addendum)
Subjective: CC: Seen in HD. Patient reports that the pain in her abdomen feels better today. It is mild in severity and located in the epigastrium and on left side of her abdomen. No n/v. Passing flatus. Liquid bm yesterday.   Objective: Vital signs in last 24 hours: Temp:  [97.5 F (36.4 C)-98.8 F (37.1 C)] 98.8 F (37.1 C) (08/29 0807) Pulse Rate:  [83-101] 86 (08/29 0930) Resp:  [13-26] 13 (08/29 0930) BP: (98-132)/(32-70) 109/50 (08/29 0930) SpO2:  [91 %-99 %] 99 % (08/29 0930) Weight:  [52.2 kg] 52.2 kg (08/29 0807) Last BM Date: 08/10/21  Intake/Output from previous day: 08/28 0701 - 08/29 0700 In: 640 [P.O.:590; IV Piggyback:50] Out: -  Intake/Output this shift: No intake/output data recorded.  PE: Gen:  Alert, NAD, pleasant Pulm:  Rate and effort normal Abd: Soft, ND, ttp over the left abdomen without peritonitis, +BS Skin: no rashes noted, warm and dry  Lab Results:  Recent Labs    08/10/21 0042 08/11/21 0359  WBC 11.6* 8.2  HGB 10.7* 10.2*  HCT 32.5* 30.2*  PLT 217 249   BMET Recent Labs    08/10/21 0042 08/11/21 0359  NA 131* 127*  K 3.7 3.7  CL 94* 88*  CO2 23 22  GLUCOSE 112* 76  BUN 35* 41*  CREATININE 6.05* 6.86*  CALCIUM 7.5* 7.3*   PT/INR No results for input(s): LABPROT, INR in the last 72 hours. CMP     Component Value Date/Time   NA 127 (L) 08/11/2021 0359   NA 140 07/13/2018 0814   K 3.7 08/11/2021 0359   CL 88 (L) 08/11/2021 0359   CO2 22 08/11/2021 0359   GLUCOSE 76 08/11/2021 0359   BUN 41 (H) 08/11/2021 0359   BUN 45 (H) 07/13/2018 0814   CREATININE 6.86 (H) 08/11/2021 0359   CALCIUM 7.3 (L) 08/11/2021 0359   CALCIUM 9.6 08/03/2011 1005   PROT 6.9 08/06/2021 0951   PROT 6.9 07/13/2018 0814   ALBUMIN 2.4 (L) 08/09/2021 0204   ALBUMIN 4.1 07/13/2018 0814   AST 20 08/06/2021 0951   ALT 11 08/06/2021 0951   ALKPHOS 149 (H) 08/06/2021 0951   BILITOT 1.1 08/06/2021 0951   BILITOT 0.4 07/13/2018 0814    GFRNONAA 6 (L) 08/11/2021 0359   GFRAA 7 (L) 07/26/2020 0920   Lipase     Component Value Date/Time   LIPASE 40 08/06/2021 0951    Studies/Results: CT ABDOMEN PELVIS W CONTRAST  Result Date: 08/10/2021 CLINICAL DATA:  Complication of diverticulitis suspected. EXAM: CT ABDOMEN AND PELVIS WITH CONTRAST TECHNIQUE: Multidetector CT imaging of the abdomen and pelvis was performed using the standard protocol following bolus administration of intravenous contrast. CONTRAST:  44mL OMNIPAQUE IOHEXOL 350 MG/ML SOLN COMPARISON:  CT abdomen dated 08/06/2021. FINDINGS: Lower chest: Patchy chronic micronodularity at the bilateral lung bases, incompletely imaged, not significantly changed for greater than 5 years indicating benignity. Small RIGHT pleural effusion with adjacent atelectasis. Hepatobiliary: No focal liver abnormality is seen. Status post cholecystectomy. Pancreas: Unremarkable. No pancreatic ductal dilatation or surrounding inflammatory changes. Spleen: Normal in size without focal abnormality. Adrenals/Urinary Tract: Atrophic kidneys. LEFT renal cyst. No acute findings. Bladder is decompressed. Stomach/Bowel: Abnormal-appearing small bowel within the LEFT abdomen (proximal jejunum), with amorphous/thickened walls and surrounding mesenteric inflammation. There is new free intraperitoneal air suggesting bowel perforation, likely source being this abnormal-appearing small bowel in the LEFT abdomen, suspect bowel ischemia. Previous study showed abnormal appearing sigmoid/descending colon suggesting acute diverticulitis.  This is not as clearly delineated on today's exam. The free air may, however, be the result of a ruptured diverticulitis and the abnormal-appearing small bowel in the LEFT abdomen may be reactive in nature. No extravasated oral contrast to confirm small-bowel perforation. No dilated large or small bowel loops are identified elsewhere in the abdomen and pelvis Vascular/Lymphatic: Aortic  atherosclerosis. Heavy atherosclerotic changes of the aortic branch vessels. No acute-appearing vascular abnormality. 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 No enlarged lymph nodes are seen. Reproductive: Uterus and bilateral adnexa are unremarkable. Other: Small amount of free fluid within the abdomen and pelvis. No circumscribed fluid collection, however, early abscess collection in the LEFT abdomen is difficult to exclude. Musculoskeletal: No acute-appearing osseous abnormality. 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 IMPRESSION: 1. New free intraperitoneal air indicating bowel perforation. 2. Abnormal-appearing small bowel within the LEFT abdomen (proximal jejunum), with amorphous/thickened walls and surrounding mesenteric inflammation. Findings are suspicious for small bowel perforation at this level, possibly indicating small bowel ischemia. Alternatively, this may represent reactive bowel wall thickening (see below). 3. Previous study showed abnormal appearing sigmoid/descending colon suggesting acute diverticulitis. This is not as clearly delineated on today's exam. The free air, however, may be the result of a ruptured diverticulitis with the abnormal-appearing small bowel in the LEFT abdomen being reactive in nature. 4. Scattered free fluid within the abdomen and pelvis. No circumscribed fluid collection, however, early abscess collection in the LEFT abdomen is difficult to exclude. 5. Small RIGHT pleural effusion with adjacent atelectasis. 6. 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 Aortic Atherosclerosis (ICD10-I70.0). Critical Value/emergent results were called by telephone at the time of interpretation on 08/10/2021 at 4:58 pm to provider Dr. Cathlean Sauer, who verbally acknowledged these results. Electronically Signed   By: Franki Cabot M.D.   On: 08/10/2021 17:00    Anti-infectives: Anti-infectives  (From admission, onward)    Start     Dose/Rate Route Frequency Ordered Stop   08/06/21 2300  piperacillin-tazobactam (ZOSYN) IVPB 2.25 g        2.25 g 100 mL/hr over 30 Minutes Intravenous Every 8 hours 08/06/21 1611     08/06/21 2200  piperacillin-tazobactam (ZOSYN) IVPB 3.375 g  Status:  Discontinued        3.375 g 100 mL/hr over 30 Minutes Intravenous Every 8 hours 08/06/21 1606 08/06/21 1611   08/06/21 1530  piperacillin-tazobactam (ZOSYN) IVPB 3.375 g        3.375 g 100 mL/hr over 30 Minutes Intravenous  Once 08/06/21 1516 08/06/21 1632        Assessment/Plan Sigmoid diverticulitis with microperforation - Repeat CT 8/28 w/ SB thickening likely reactive 2/2 continued inflammation of diverticulitis w/ microperf  - WBC normalized (11.6 > 8.2). Reports her pain has improved. Allow sips of clears - Repeat labs in AM - Hopefully will improve with conservative therapies.   FEN - sips of clears VTE - heparin subq  ID - Zosyn  ESRD on HD Hx of CVA Hx of HTN Hx of HLD Hx of A. Fib   LOS: 5 days    Jillyn Ledger , [facility] Surgery 08/11/2021, 9:52 AM Please see Amion for pager number during day hours 7:00am-4:30pm

## 2021-08-11 NOTE — Progress Notes (Signed)
Subjective: Seen on dialysis, abdominal pain making mild improvement had bowel movement last night ,feels hungry  Objective Vital signs in last 24 hours: Vitals:   08/11/21 0400 08/11/21 0807 08/11/21 0815 08/11/21 0830  BP: (!) 103/47 107/70 (!) 108/43 114/65  Pulse: 84 100 96 97  Resp: 17 17 18  (!) 26  Temp: 98.1 F (36.7 C) 98.8 F (37.1 C)    TempSrc: Oral Oral    SpO2: 94% 92%    Weight:  52.2 kg     Weight change:   Physical Exam: General: Alert thin chronically ill-appearing female on hemodialysis Heart: RRR no MRG Lungs: CTA, nonlabored breathing Abdomen: BS NABS, tenderness improved  slightly distended Extremities: No pedal edema Dialysis Access: Right upper arm AV Gore-Tex graft patent on HD  Dialysis Orders: [facility] , MWF 4-hour 2K, 2 calcium bath, EDW 49.5  No heparin, RUA AVGG No ESA  ,hgb 11.2, 11.8/Mircera 60 7/18 Sensipar 30 Hectorol 4   Problem/Plan: Sigmoid diverticulitis with microperforation- on IV Zosyn and CCS following.  Last WBC 8.2 yesterday a.m. lab pending ESRD -normal HD MWF, HD off schedule 8/25 due to staff shortage and increased patient load.  HD today on schedule  Hypertension/volume - BP low initially but in the 80s and back up to 103 will give albumin, decrease UF goal 2.5 to 1 L as she is has been n.p.o. since admission does not appear volume overloaded. Per weights close to dry post HD. Anemia of ESRD-Hgb 10.2 no ESA needed for now(last given Mircera 60 on 07/28/47) Metabolic bone disease - corrected Ca and phos in goal.  Continue Hectorol.  Resume sensipar and binders once diet advanced.  History of CVA-on statin and aspirin History of proximal A. Fib/sinus rhythm no anticoagulation. Stable To anxiety on Xanax and BuSpar per admit History of CAD   Ernest Haber, PA-C [facility] Beeper [phone] 08/11/2021,8:54 AM  LOS: 5 days   Labs: Basic Metabolic Panel: Recent Labs  Lab 08/07/21 0131 08/08/21 0453 08/09/21 0204  08/10/21 0042 08/11/21 0359  NA 135 136 133* 131* 127*  K 4.9 3.9 3.9 3.7 3.7  CL 92* 96* 94* 94* 88*  CO2 27 27 26 23 22   GLUCOSE 70 57* 58* 112* 76  BUN 45* 17 27* 35* 41*  CREATININE 7.24* 3.79* 4.91* 6.05* 6.86*  CALCIUM 8.2* 8.7* 7.4* 7.5* 7.3*  PHOS 6.6* 4.2 2.8  --   --    Liver Function Tests: Recent Labs  Lab 08/06/21 0951 08/07/21 0131 08/08/21 0453 08/09/21 0204  AST 20  --   --   --   ALT 11  --   --   --   ALKPHOS 149*  --   --   --   BILITOT 1.1  --   --   --   PROT 6.9  --   --   --   ALBUMIN 3.1* 2.7* 2.5* 2.4*   Recent Labs  Lab 08/06/21 0951  LIPASE 40   No results for input(s): AMMONIA in the last 168 hours. CBC: Recent Labs  Lab 08/06/21 0951 08/07/21 0131 08/08/21 0453 08/09/21 0204 08/10/21 0042 08/11/21 0359  WBC 4.9 15.7* 12.2* 9.0 11.6* 8.2  NEUTROABS 3.0  --   --   --   --   --   HGB 12.5 11.6* 11.3* 10.6* 10.7* 10.2*  HCT 40.0 35.1* 34.4* 32.2* 32.5* 30.2*  MCV 97.1 92.9 92.5 92.0 91.5 89.1  PLT 212 216 240 231 217 249  Cardiac Enzymes: No results for input(s): CKTOTAL, CKMB, CKMBINDEX, TROPONINI in the last 168 hours. CBG: Recent Labs  Lab 08/08/21 0613 08/08/21 0642 08/08/21 2040 08/09/21 0529 08/09/21 1547  GLUCAP 52* 156* 80 84 140*    Studies/Results: CT ABDOMEN PELVIS W CONTRAST  Result Date: 08/10/2021 CLINICAL DATA:  Complication of diverticulitis suspected. EXAM: CT ABDOMEN AND PELVIS WITH CONTRAST TECHNIQUE: Multidetector CT imaging of the abdomen and pelvis was performed using the standard protocol following bolus administration of intravenous contrast. CONTRAST:  21mL OMNIPAQUE IOHEXOL 350 MG/ML SOLN COMPARISON:  CT abdomen dated 08/06/2021. FINDINGS: Lower chest: Patchy chronic micronodularity at the bilateral lung bases, incompletely imaged, not significantly changed for greater than 5 years indicating benignity. Small RIGHT pleural effusion with adjacent atelectasis. Hepatobiliary: No focal liver abnormality  is seen. Status post cholecystectomy. Pancreas: Unremarkable. No pancreatic ductal dilatation or surrounding inflammatory changes. Spleen: Normal in size without focal abnormality. Adrenals/Urinary Tract: Atrophic kidneys. LEFT renal cyst. No acute findings. Bladder is decompressed. Stomach/Bowel: Abnormal-appearing small bowel within the LEFT abdomen (proximal jejunum), with amorphous/thickened walls and surrounding mesenteric inflammation. There is new free intraperitoneal air suggesting bowel perforation, likely source being this abnormal-appearing small bowel in the LEFT abdomen, suspect bowel ischemia. Previous study showed abnormal appearing sigmoid/descending colon suggesting acute diverticulitis. This is not as clearly delineated on today's exam. The free air may, however, be the result of a ruptured diverticulitis and the abnormal-appearing small bowel in the LEFT abdomen may be reactive in nature. No extravasated oral contrast to confirm small-bowel perforation. No dilated large or small bowel loops are identified elsewhere in the abdomen and pelvis Vascular/Lymphatic: Aortic atherosclerosis. Heavy atherosclerotic changes of the aortic branch vessels. No acute-appearing vascular abnormality. 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 No enlarged lymph nodes are seen. Reproductive: Uterus and bilateral adnexa are unremarkable. Other: Small amount of free fluid within the abdomen and pelvis. No circumscribed fluid collection, however, early abscess collection in the LEFT abdomen is difficult to exclude. Musculoskeletal: No acute-appearing osseous abnormality. 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 IMPRESSION: 1. New free intraperitoneal air indicating bowel perforation. 2. Abnormal-appearing small bowel within the LEFT abdomen (proximal jejunum), with amorphous/thickened walls and surrounding mesenteric inflammation. Findings are suspicious for  small bowel perforation at this level, possibly indicating small bowel ischemia. Alternatively, this may represent reactive bowel wall thickening (see below). 3. Previous study showed abnormal appearing sigmoid/descending colon suggesting acute diverticulitis. This is not as clearly delineated on today's exam. The free air, however, may be the result of a ruptured diverticulitis with the abnormal-appearing small bowel in the LEFT abdomen being reactive in nature. 4. Scattered free fluid within the abdomen and pelvis. No circumscribed fluid collection, however, early abscess collection in the LEFT abdomen is difficult to exclude. 5. Small RIGHT pleural effusion with adjacent atelectasis. 6. 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 Aortic Atherosclerosis (ICD10-I70.0). Critical Value/emergent results were called by telephone at the time of interpretation on 08/10/2021 at 4:58 pm to provider Dr. Cathlean Sauer, who verbally acknowledged these results. Electronically Signed   By: Franki Cabot M.D.   On: 08/10/2021 17:00   Medications:  albumin human     piperacillin-tazobactam (ZOSYN)  IV 2.25 g (08/11/21 0507)    aspirin EC  81 mg Oral Daily   atorvastatin  80 mg Oral Daily   Chlorhexidine Gluconate Cloth  6 each Topical Q0600   cinacalcet  30 mg Oral Q breakfast  heparin  5,000 Units Subcutaneous Q8H   pantoprazole  40 mg Oral Daily   sodium chloride flush  3 mL Intravenous Q12H   zolpidem  5 mg Oral QHS

## 2021-08-12 DIAGNOSIS — E43 Unspecified severe protein-calorie malnutrition: Secondary | ICD-10-CM | POA: Insufficient documentation

## 2021-08-12 DIAGNOSIS — K5720 Diverticulitis of large intestine with perforation and abscess without bleeding: Secondary | ICD-10-CM | POA: Diagnosis not present

## 2021-08-12 LAB — BASIC METABOLIC PANEL
Anion gap: 17 — ABNORMAL HIGH (ref 5–15)
BUN: 20 mg/dL (ref 8–23)
CO2: 21 mmol/L — ABNORMAL LOW (ref 22–32)
Calcium: 7.9 mg/dL — ABNORMAL LOW (ref 8.9–10.3)
Chloride: 95 mmol/L — ABNORMAL LOW (ref 98–111)
Creatinine, Ser: 4.4 mg/dL — ABNORMAL HIGH (ref 0.44–1.00)
GFR, Estimated: 10 mL/min — ABNORMAL LOW (ref >60)
Glucose, Bld: 81 mg/dL (ref 70–99)
Potassium: 4.1 mmol/L (ref 3.5–5.1)
Sodium: 133 mmol/L — ABNORMAL LOW (ref 135–145)

## 2021-08-12 LAB — CBC
HCT: 32.8 % — ABNORMAL LOW (ref 36.0–46.0)
Hemoglobin: 10.8 g/dL — ABNORMAL LOW (ref 12.0–15.0)
MCH: 30.4 pg (ref 26.0–34.0)
MCHC: 32.9 g/dL (ref 30.0–36.0)
MCV: 92.4 fL (ref 80.0–100.0)
Platelets: 245 10*3/uL (ref 150–400)
RBC: 3.55 MIL/uL — ABNORMAL LOW (ref 3.87–5.11)
RDW: 15.7 % — ABNORMAL HIGH (ref 11.5–15.5)
WBC: 9.3 10*3/uL (ref 4.0–10.5)
nRBC: 0 % (ref 0.0–0.2)

## 2021-08-12 MED ORDER — RENA-VITE PO TABS
1.0000 | ORAL_TABLET | Freq: Every day | ORAL | Status: DC
  Administered 2021-08-12: 1 via ORAL
  Filled 2021-08-12: qty 1

## 2021-08-12 NOTE — Progress Notes (Signed)
Subjective: Reports some abdominal pain this a.m. improved with pain pills but overall pain decreasing, said tolerated dialysis yesterday  Objective Vital signs in last 24 hours: Vitals:   08/11/21 1745 08/11/21 1932 08/12/21 0417 08/12/21 0841  BP: (!) 100/46 (!) 100/48 (!) 101/45 (!) 136/40  Pulse: 91 86 85 98  Resp: 18 20 20 17   Temp: 98 F (36.7 C) 98 F (36.7 C) 97.6 F (36.4 C) (!) 97.5 F (36.4 C)  TempSrc: Oral Oral Oral Oral  SpO2: 99% 96% 93% (!) 89%  Weight:       Weight change:   Physical Exam: General: Alert thin chronically ill-appearing female NAD Heart: RRR no MRG Lungs: CTA, nonlabored breathing Abdomen: BS NABS, tenderness improved  slightly distended Extremities: No pedal edema Dialysis Access: RUA  AVGG positive bruit   Dialysis Orders: [facility] , MWF 4-hour 2K, 2 calcium bath, EDW 49.5  No heparin, RUA AVGG No ESA  ,hgb 11.2, 11.8/Mircera 60 7/18 Sensipar 30 Hectorol 4   Problem/Plan: Sigmoid diverticulitis with microperforation- on IV Zosyn and CCS following.  WBC 9.3 this a.m ESRD -normal HD MWF, HD now on schedule next dialysis tomorrow HD,K4.1  Hypertension/volume -yesterday start of dialysis BP low initially 80s and back up to 103 with albumin  decrease d UF goal 2.5 to 1 L, total UF 1002 mL yesterday post weight 51.2 but bed weight, has been n.p.o. since admission does not appear volume overloaded.  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  Continue Hectorol.  Resume sensipar and binders once diet advanced.  History of CVA-on statin and aspirin History of proximal A. Fib/sinus rhythm no anticoagulation. Stable HO anxiety/depression = per admit on Xanax and BuSpar  History of CAD   Ernest Haber, PA-C [facility] Beeper [phone] 08/12/2021,9:31 AM  LOS: 6 days   Labs: Basic Metabolic Panel: Recent Labs  Lab 08/07/21 0131 08/08/21 0453 08/09/21 0204 08/10/21 0042 08/11/21 0359 08/12/21 0111  NA 135 136 133* 131* 127* 133*  K 4.9 3.9 3.9 3.7 3.7 4.1  CL 92* 96* 94* 94* 88* 95*  CO2 27 27 26 23 22  21*  GLUCOSE 70 57* 58* 112* 76 81  BUN 45* 17 27* 35* 41* 20  CREATININE 7.24* 3.79* 4.91* 6.05* 6.86* 4.40*  CALCIUM 8.2* 8.7* 7.4* 7.5* 7.3* 7.9*  PHOS 6.6* 4.2 2.8  --   --   --    Liver Function Tests: Recent Labs  Lab 08/06/21 0951 08/07/21 0131 08/08/21 0453 08/09/21 0204  AST 20  --   --   --   ALT 11  --   --   --   ALKPHOS 149*  --   --   --   BILITOT 1.1  --   --   --   PROT 6.9  --   --   --   ALBUMIN 3.1* 2.7* 2.5* 2.4*   Recent Labs  Lab 08/06/21 0951  LIPASE 40   No results for input(s): AMMONIA in the last 168 hours. CBC: Recent Labs  Lab 08/06/21 0951 08/07/21 0131 08/08/21 0453 08/09/21 0204 08/10/21 0042 08/11/21 0359 08/12/21 0111  WBC 4.9   < > 12.2* 9.0 11.6* 8.2 9.3  NEUTROABS 3.0  --   --   --   --   --   --  HGB 12.5   < > 11.3* 10.6* 10.7* 10.2* 10.8*  HCT 40.0   < > 34.4* 32.2* 32.5* 30.2* 32.8*  MCV 97.1   < > 92.5 92.0 91.5 89.1 92.4  PLT 212   < > 240 231 217 249 245   < > = values in this interval not displayed.   Cardiac Enzymes: No results for input(s): CKTOTAL, CKMB, CKMBINDEX, TROPONINI in the last 168 hours. CBG: Recent Labs  Lab 08/08/21 0613 08/08/21 0642 08/08/21 2040 08/09/21 0529 08/09/21 1547  GLUCAP 52* 156* 80 84 140*    Studies/Results: CT ABDOMEN PELVIS W CONTRAST  Result Date: 08/10/2021 CLINICAL DATA:  Complication of diverticulitis suspected. EXAM: CT ABDOMEN AND PELVIS WITH CONTRAST TECHNIQUE: Multidetector CT imaging of the abdomen and pelvis was performed using the standard protocol following bolus administration of intravenous contrast. CONTRAST:  27mL OMNIPAQUE  IOHEXOL 350 MG/ML SOLN COMPARISON:  CT abdomen dated 08/06/2021. FINDINGS: Lower chest: Patchy chronic micronodularity at the bilateral lung bases, incompletely imaged, not significantly changed for greater than 5 years indicating benignity. Small RIGHT pleural effusion with adjacent atelectasis. Hepatobiliary: No focal liver abnormality is seen. Status post cholecystectomy. Pancreas: Unremarkable. No pancreatic ductal dilatation or surrounding inflammatory changes. Spleen: Normal in size without focal abnormality. Adrenals/Urinary Tract: Atrophic kidneys. LEFT renal cyst. No acute findings. Bladder is decompressed. Stomach/Bowel: Abnormal-appearing small bowel within the LEFT abdomen (proximal jejunum), with amorphous/thickened walls and surrounding mesenteric inflammation. There is new free intraperitoneal air suggesting bowel perforation, likely source being this abnormal-appearing small bowel in the LEFT abdomen, suspect bowel ischemia. Previous study showed abnormal appearing sigmoid/descending colon suggesting acute diverticulitis. This is not as clearly delineated on today's exam. The free air may, however, be the result of a ruptured diverticulitis and the abnormal-appearing small bowel in the LEFT abdomen may be reactive in nature. No extravasated oral contrast to confirm small-bowel perforation. No dilated large or small bowel loops are identified elsewhere in the abdomen and pelvis Vascular/Lymphatic: Aortic atherosclerosis. Heavy atherosclerotic changes of the aortic branch vessels. No acute-appearing vascular abnormality. 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 No enlarged lymph nodes are seen. Reproductive: Uterus and bilateral adnexa are unremarkable. Other: Small amount of free fluid within the abdomen and pelvis. No circumscribed fluid collection, however, early abscess collection in the LEFT abdomen is difficult to exclude. Musculoskeletal: No acute-appearing osseous  abnormality. 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 IMPRESSION: 1. New free intraperitoneal air indicating bowel perforation. 2. Abnormal-appearing small bowel within the LEFT abdomen (proximal jejunum), with amorphous/thickened walls and surrounding mesenteric inflammation. Findings are suspicious for small bowel perforation at this level, possibly indicating small bowel ischemia. Alternatively, this may represent reactive bowel wall thickening (see below). 3. Previous study showed abnormal appearing sigmoid/descending colon suggesting acute diverticulitis. This is not as clearly delineated on today's exam. The free air, however, may be the result of a ruptured diverticulitis with the abnormal-appearing small bowel in the LEFT abdomen being reactive in nature. 4. Scattered free fluid within the abdomen and pelvis. No circumscribed fluid collection, however, early abscess collection in the LEFT abdomen is difficult to exclude. 5. Small RIGHT pleural effusion with adjacent atelectasis. 6. 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 Aortic Atherosclerosis (ICD10-I70.0). Critical Value/emergent results were called by telephone at the time of interpretation on 08/10/2021 at 4:58 pm to provider Dr. Cathlean Sauer, who verbally acknowledged these results. Electronically Signed   By: Roxy Horseman.D.  On: 08/10/2021 17:00   Medications:  piperacillin-tazobactam (ZOSYN)  IV 2.25 g (08/12/21 0422)    aspirin EC  81 mg Oral Daily   atorvastatin  80 mg Oral Daily   Chlorhexidine Gluconate Cloth  6 each Topical Q0600   cinacalcet  30 mg Oral Q breakfast   heparin  5,000 Units Subcutaneous Q8H   pantoprazole  40 mg Oral Daily   sodium chloride flush  3 mL Intravenous Q12H   zolpidem  5 mg Oral QHS

## 2021-08-12 NOTE — Plan of Care (Signed)
  Problem: Clinical Measurements: Goal: Will remain free from infection Outcome: Progressing Goal: Diagnostic test results will improve Outcome: Progressing   

## 2021-08-12 NOTE — Progress Notes (Signed)
Initial Nutrition Assessment  DOCUMENTATION CODES:   Severe malnutrition in context of chronic illness  INTERVENTION:   If unable to advance diet within 24-48 hours, recommend initiation of TPN  Recommend Dysphagia 3 (mechanical soft/easy to chew) diet once diet advanced given poor dentition  Recommend Ensure Enlive po BID, each supplement provides 350 kcal and 20 grams of protein, once diet advanced  Add Renal MVI daily   NUTRITION DIAGNOSIS:   Severe Malnutrition related to chronic illness as evidenced by severe fat depletion, severe muscle depletion, energy intake < 75% for > or equal to 1 month.  GOAL:   Patient will meet greater than or equal to 90% of their needs  MONITOR:   PO intake, Supplement acceptance, Labs, Weight trends  REASON FOR ASSESSMENT:   Consult Assessment of nutrition requirement/status  ASSESSMENT:   70 yo female admitted with sigmoid diverticulitis with microperforation PMH includes ESRD on iHD, HLD, GERD, stroke, CHF  Abdominal pain, +flatus, +BM. Pt reports she is tolerating sips of CL; reports she had a sip of liquid with her meds this AM and "did just fine."   Pt with very poor dentition. Pt reports has tooth pain as well making it difficult to chew, reports she had a "crown that popped off" right before she came in to hospital and she plans to have if fixed at some point  Pt reports she has had a poor appetite at home. Pt reports she is supposed to eat 3 meals per day but has only been eating 2. When asked specifically what she has been eating pt simply states "mashed potatoes." Pt also reports she takes protein supplements at home but could not specify which one. Pt reports she is supposed to take this 3 times a day as well but usually gets in "almost 2."  Pt reports she lives at home with her mother. Pt reports neither one of them cook but they receive Meals on Wheels. Pt also reports she ambulates using a walker.   Outpatient patient EDW  49.5 kg. Current wt 51.2 kg. Per chart review from previous admissions,  pt has hx of chronic poor appetite with chronic poor po, +wt loss  Labs: sodium 133 (L), phosphorus 2.8 Meds: sensipar,   NUTRITION - FOCUSED PHYSICAL EXAM:  Flowsheet Row Most Recent Value  Orbital Region Moderate depletion  Upper Arm Region Severe depletion  Thoracic and Lumbar Region Severe depletion  Buccal Region Moderate depletion  Temple Region Moderate depletion  Clavicle Bone Region Severe depletion  Clavicle and Acromion Bone Region Severe depletion  Scapular Bone Region Severe depletion  Dorsal Hand Moderate depletion  Patellar Region Severe depletion  Anterior Thigh Region Severe depletion  Posterior Calf Region Severe depletion  Edema (RD Assessment) None       Diet Order:   Diet Order             Diet NPO time specified Except for: [employer], Sips with Meds, Other (See Comments)  Diet effective now                   EDUCATION NEEDS:   Not appropriate for education at this time  Skin:  Skin Assessment: Reviewed RN Assessment  Last BM:  8/30  Height:   Ht Readings from Last 1 Encounters:  07/17/21 5\' 2"  (1.575 m)    Weight:   Wt Readings from Last 1 Encounters:  08/11/21 51.2 kg    BMI:  Body mass index is 20.65 kg/m.  Estimated  Nutritional Needs:   Kcal:  1600-1800 kcals  Protein:  75-90 g  Fluid:  1000 mL plus UOP    Kerman Passey MS, RDN, LDN, CNSC Registered Dietitian III Clinical Nutrition RD Pager and On-Call Pager Number Located in [place]

## 2021-08-12 NOTE — Progress Notes (Signed)
PROGRESS NOTE    [name]  EPP:[id number] DOB: 1951/01/10 DOA: 08/06/2021 PCP: Renford Dills, MD    Brief Narrative:  This 70 year old female with past medical history for end-stage renal disease on hemodialysis, coronary artery disease, diastolic heart failure and CVA who presented with severe abdominal pain associated with nausea and intermittent diarrhea. CT of the abdomen showed acute sigmoid diverticulitis with extraluminal air compatible with microperforation.  No organized fluid collection or abscess.  Patient started on supportive medical therapy including IV fluids and IV antibiotics.  General surgery was consulted.  Patient is slowly improving.  Because of worsening leukocytosis repeat CT was ordered which reported free air in the peritoneum indicating bowel perforation.  Patient is clinically improving,  plan to continue supportive care including IV antibiotics.  Assessment & Plan:   Principal Problem:   Diverticulitis of colon with perforation Active Problems:   End-stage renal disease on hemodialysis (HCC)   Hyperlipidemia   Atrial fibrillation (HCC)   Gastro-esophageal reflux disease without esophagitis   Acute respiratory failure with hypoxia (HCC)   Protein-calorie malnutrition, severe  Acute sigmoid diverticulitis complicated with microperforation: Patient presented with worsening abdominal pain, nausea and intermittent diarrhea. CT abdomen pelvis with new free air indicating bowel perforation possible associated with bowel ischemia. General surgery recommended conservative management. Plan is to continue IV antibiotics with IV Zosyn. Adequate pain control with Dilaudid, IV Zofran. General surgery recommended clear liquid diet.  Hopefully will improve with conservative therapy WBC improving.  End-stage renal disease on hemodialysis: Continue dialysis as per schedule. No signs of volume overload. Anemia of chronic disease:  hemoglobin is stable.  Atrial  fibrillation paroxysmal: Heart rate remained controlled,  Continue to hold anticoagulation in the setting of acute bowel perforation.  Dyslipidemia :  Continue atorvastatin  GERD:  Continue pantoprazole.  History of CVA : continue aspirin and statin  Hypertension: Continue home blood pressure medications.  Depression and anxiety : Continue buspirone and alprazolam  Hypoglycemia : continue hypoglycemic protocol.  DVT prophylaxis: SCDs Code Status:Full code. Family Communication: No family at bed side. Disposition Plan:   Status is: Inpatient  Remains inpatient appropriate because:Inpatient level of care appropriate due to severity of illness  Dispo: The patient is from: Home              Anticipated d/c is to: Home              Patient currently is not medically stable to d/c.   Difficult to place patient No   Consultants:  General surgery  Procedures:  Antimicrobials:   Anti-infectives (From admission, onward)    Start     Dose/Rate Route Frequency Ordered Stop   08/06/21 2300  piperacillin-tazobactam (ZOSYN) IVPB 2.25 g        2.25 g 100 mL/hr over 30 Minutes Intravenous Every 8 hours 08/06/21 1611     08/06/21 2200  piperacillin-tazobactam (ZOSYN) IVPB 3.375 g  Status:  Discontinued        3.375 g 100 mL/hr over 30 Minutes Intravenous Every 8 hours 08/06/21 1606 08/06/21 1611   08/06/21 1530  piperacillin-tazobactam (ZOSYN) IVPB 3.375 g        3.375 g 100 mL/hr over 30 Minutes Intravenous  Once 08/06/21 1516 08/06/21 1632       Subjective: Patient was seen and examined at bedside.  Overnight events noted.   She reports feeling better.  Denies any abdominal pain, Nausea and vomiting.  Objective: Vitals:   08/11/21 1745 08/11/21 1932 08/12/21  0417 08/12/21 0841  BP: (!) 100/46 (!) 100/48 (!) 101/45 (!) 136/40  Pulse: 91 86 85 98  Resp: 18 20 20 17   Temp: 98 F (36.7 C) 98 F (36.7 C) 97.6 F (36.4 C) (!) 97.5 F (36.4 C)  TempSrc: Oral Oral Oral  Oral  SpO2: 99% 96% 93% (!) 89%  Weight:        Intake/Output Summary (Last 24 hours) at 08/12/2021 1525 Last data filed at 08/11/2021 1700 Gross per 24 hour  Intake 100 ml  Output --  Net 100 ml   Filed Weights   08/08/21 0350 08/11/21 0807 08/11/21 1137  Weight: 49.9 kg 52.2 kg 51.2 kg    Examination:  General exam: Appears comfortable not in any acute distress. Respiratory system: Clear to auscultation. Respiratory effort normal. Cardiovascular system: S1 & S2 heard, RRR. No JVD, murmurs, rubs, gallops or clicks. No pedal edema. Gastrointestinal system: Abdomen is soft, nontender, nondistended.  BS+ Central nervous system: Alert and oriented x 3. No focal neurological deficits. Extremities: No edema, no cyanosis, no clubbing. Skin: No rashes, lesions or ulcers Psychiatry: Judgement and insight appear normal. Mood & affect appropriate.     Data Reviewed: I have personally reviewed following labs and imaging studies  CBC: Recent Labs  Lab 08/06/21 0951 08/07/21 0131 08/08/21 0453 08/09/21 0204 08/10/21 0042 08/11/21 0359 08/12/21 0111  WBC 4.9   < > 12.2* 9.0 11.6* 8.2 9.3  NEUTROABS 3.0  --   --   --   --   --   --   HGB 12.5   < > 11.3* 10.6* 10.7* 10.2* 10.8*  HCT 40.0   < > 34.4* 32.2* 32.5* 30.2* 32.8*  MCV 97.1   < > 92.5 92.0 91.5 89.1 92.4  PLT 212   < > 240 231 217 249 245   < > = values in this interval not displayed.   Basic Metabolic Panel: Recent Labs  Lab 08/07/21 0131 08/08/21 0453 08/09/21 0204 08/10/21 0042 08/11/21 0359 08/12/21 0111  NA 135 136 133* 131* 127* 133*  K 4.9 3.9 3.9 3.7 3.7 4.1  CL 92* 96* 94* 94* 88* 95*  CO2 27 27 26 23 22  21*  GLUCOSE 70 57* 58* 112* 76 81  BUN 45* 17 27* 35* 41* 20  CREATININE 7.24* 3.79* 4.91* 6.05* 6.86* 4.40*  CALCIUM 8.2* 8.7* 7.4* 7.5* 7.3* 7.9*  PHOS 6.6* 4.2 2.8  --   --   --    GFR: Estimated Creatinine Clearance: 9.4 mL/min (A) (by C-G formula based on SCr of 4.4 mg/dL (H)). Liver  Function Tests: Recent Labs  Lab 08/06/21 0951 08/07/21 0131 08/08/21 0453 08/09/21 0204  AST 20  --   --   --   ALT 11  --   --   --   ALKPHOS 149*  --   --   --   BILITOT 1.1  --   --   --   PROT 6.9  --   --   --   ALBUMIN 3.1* 2.7* 2.5* 2.4*   Recent Labs  Lab 08/06/21 0951  LIPASE 40   No results for input(s): AMMONIA in the last 168 hours. Coagulation Profile: No results for input(s): INR, PROTIME in the last 168 hours. Cardiac Enzymes: No results for input(s): CKTOTAL, CKMB, CKMBINDEX, TROPONINI in the last 168 hours. BNP (last 3 results) No results for input(s): PROBNP in the last 8760 hours. HbA1C: No results for input(s): HGBA1C in the last  72 hours. CBG: Recent Labs  Lab 08/08/21 0613 08/08/21 0642 08/08/21 2040 08/09/21 0529 08/09/21 1547  GLUCAP 52* 156* 80 84 140*   Lipid Profile: No results for input(s): CHOL, HDL, LDLCALC, TRIG, CHOLHDL, LDLDIRECT in the last 72 hours. Thyroid Function Tests: No results for input(s): TSH, T4TOTAL, FREET4, T3FREE, THYROIDAB in the last 72 hours. Anemia Panel: No results for input(s): VITAMINB12, FOLATE, FERRITIN, TIBC, IRON, RETICCTPCT in the last 72 hours. Sepsis Labs: No results for input(s): PROCALCITON, LATICACIDVEN in the last 168 hours.  Recent Results (from the past 240 hour(s))  Resp Panel by RT-PCR (Flu A&B, Covid) Nasopharyngeal Swab     Status: None   Collection Time: 08/06/21 12:54 PM   Specimen: Nasopharyngeal Swab; Nasopharyngeal(NP) swabs in vial transport medium  Result Value Ref Range Status   SARS Coronavirus 2 by RT PCR NEGATIVE NEGATIVE Final    Comment: (NOTE) SARS-CoV-2 target nucleic acids are NOT DETECTED.  The SARS-CoV-2 RNA is generally detectable in upper respiratory specimens during the acute phase of infection. The lowest concentration of SARS-CoV-2 viral copies this assay can detect is 138 copies/mL. A negative result does not preclude SARS-Cov-2 infection and should not be used  as the sole basis for treatment or other patient management decisions. A negative result may occur with  improper specimen collection/handling, submission of specimen other than nasopharyngeal swab, presence of viral mutation(s) within the areas targeted by this assay, and inadequate number of viral copies(<138 copies/mL). A negative result must be combined with clinical observations, patient history, and epidemiological information. The expected result is Negative.  Fact Sheet for Patients:  BloggerCourse.com  Fact Sheet for Healthcare Providers:  SeriousBroker.it  This test is no t yet approved or cleared by the [place] FDA and  has been authorized for detection and/or diagnosis of SARS-CoV-2 by FDA under an Emergency Use Authorization (EUA). This EUA will remain  in effect (meaning this test can be used) for the duration of the COVID-19 declaration under Section 564(b)(1) of the Act, 21 U.S.C.section 360bbb-3(b)(1), unless the authorization is terminated  or revoked sooner.       Influenza A by PCR NEGATIVE NEGATIVE Final   Influenza B by PCR NEGATIVE NEGATIVE Final    Comment: (NOTE) The Xpert Xpress SARS-CoV-2/FLU/RSV plus assay is intended as an aid in the diagnosis of influenza from Nasopharyngeal swab specimens and should not be used as a sole basis for treatment. Nasal washings and aspirates are unacceptable for Xpert Xpress SARS-CoV-2/FLU/RSV testing.  Fact Sheet for Patients: BloggerCourse.com  Fact Sheet for Healthcare Providers: SeriousBroker.it  This test is not yet approved or cleared by the [place] FDA and has been authorized for detection and/or diagnosis of SARS-CoV-2 by FDA under an Emergency Use Authorization (EUA). This EUA will remain in effect (meaning this test can be used) for the duration of the COVID-19 declaration under Section 564(b)(1)  of the Act, 21 U.S.C. section 360bbb-3(b)(1), unless the authorization is terminated or revoked.  Performed at [facility], [address], [place], [place] [address]     Radiology Studies: No results found.  Scheduled Meds:  aspirin EC  81 mg Oral Daily   atorvastatin  80 mg Oral Daily   Chlorhexidine Gluconate Cloth  6 each Topical Q0600   cinacalcet  30 mg Oral Q breakfast   heparin  5,000 Units Subcutaneous Q8H   multivitamin  1 tablet Oral QHS   pantoprazole  40 mg Oral Daily   sodium chloride flush  3 mL Intravenous Q12H   zolpidem  5 mg Oral QHS   Continuous Infusions:  piperacillin-tazobactam (ZOSYN)  IV 2.25 g (08/12/21 1330)     LOS: 6 days    Time spent: 35  mins    Chitara Clonch, MD [facility]   If 7PM-7AM, please contact night-coverage

## 2021-08-12 NOTE — Progress Notes (Signed)
Subjective: CC: Patient reports her LLQ abdominal pain is stable and unchanged from yesterday. She is having a "tummy ache" this morning and says this is different from the pain she was having before. Feels more like something isn't settling in her stomach right. She is taking in small sips of liquids with nausea. She is passing flatus. Liquid bm x 4 documented yesterday. Afebrile overnight. WBC wnl.   Objective: Vital signs in last 24 hours: Temp:  [97 F (36.1 C)-98 F (36.7 C)] 97.5 F (36.4 C) (08/30 0841) Pulse Rate:  [83-98] 98 (08/30 0841) Resp:  [15-20] 17 (08/30 0841) BP: (95-136)/(40-56) 136/40 (08/30 0841) SpO2:  [89 %-99 %] 89 % (08/30 0841) Weight:  [51.2 kg] 51.2 kg (08/29 1137) Last BM Date: 08/10/21  Intake/Output from previous day: 08/29 0701 - 08/30 0700 In: 200 [P.O.:100; IV Piggyback:100] Out: 1002  Intake/Output this shift: No intake/output data recorded.  PE: Gen:  Alert, NAD, pleasant Pulm:  Rate and effort normal Abd: Soft, ND, ttp over the lower abdomen greater on the left and without peritonitis, +BS Skin: no rashes noted, warm and dry  Lab Results:  Recent Labs    08/11/21 0359 08/12/21 0111  WBC 8.2 9.3  HGB 10.2* 10.8*  HCT 30.2* 32.8*  PLT 249 245   BMET Recent Labs    08/11/21 0359 08/12/21 0111  NA 127* 133*  K 3.7 4.1  CL 88* 95*  CO2 22 21*  GLUCOSE 76 81  BUN 41* 20  CREATININE 6.86* 4.40*  CALCIUM 7.3* 7.9*   PT/INR No results for input(s): LABPROT, INR in the last 72 hours. CMP     Component Value Date/Time   NA 133 (L) 08/12/2021 0111   NA 140 07/13/2018 0814   K 4.1 08/12/2021 0111   CL 95 (L) 08/12/2021 0111   CO2 21 (L) 08/12/2021 0111   GLUCOSE 81 08/12/2021 0111   BUN 20 08/12/2021 0111   BUN 45 (H) 07/13/2018 0814   CREATININE 4.40 (H) 08/12/2021 0111   CALCIUM 7.9 (L) 08/12/2021 0111   CALCIUM 9.6 08/03/2011 1005   PROT 6.9 08/06/2021 0951   PROT 6.9 07/13/2018 0814   ALBUMIN 2.4 (L)  08/09/2021 0204   ALBUMIN 4.1 07/13/2018 0814   AST 20 08/06/2021 0951   ALT 11 08/06/2021 0951   ALKPHOS 149 (H) 08/06/2021 0951   BILITOT 1.1 08/06/2021 0951   BILITOT 0.4 07/13/2018 0814   GFRNONAA 10 (L) 08/12/2021 0111   GFRAA 7 (L) 07/26/2020 0920   Lipase     Component Value Date/Time   LIPASE 40 08/06/2021 0951    Studies/Results: CT ABDOMEN PELVIS W CONTRAST  Result Date: 08/10/2021 CLINICAL DATA:  Complication of diverticulitis suspected. EXAM: CT ABDOMEN AND PELVIS WITH CONTRAST TECHNIQUE: Multidetector CT imaging of the abdomen and pelvis was performed using the standard protocol following bolus administration of intravenous contrast. CONTRAST:  53mL OMNIPAQUE IOHEXOL 350 MG/ML SOLN COMPARISON:  CT abdomen dated 08/06/2021. FINDINGS: Lower chest: Patchy chronic micronodularity at the bilateral lung bases, incompletely imaged, not significantly changed for greater than 5 years indicating benignity. Small RIGHT pleural effusion with adjacent atelectasis. Hepatobiliary: No focal liver abnormality is seen. Status post cholecystectomy. Pancreas: Unremarkable. No pancreatic ductal dilatation or surrounding inflammatory changes. Spleen: Normal in size without focal abnormality. Adrenals/Urinary Tract: Atrophic kidneys. LEFT renal cyst. No acute findings. Bladder is decompressed. Stomach/Bowel: Abnormal-appearing small bowel within the LEFT abdomen (proximal jejunum), with amorphous/thickened walls and surrounding mesenteric inflammation.  There is new free intraperitoneal air suggesting bowel perforation, likely source being this abnormal-appearing small bowel in the LEFT abdomen, suspect bowel ischemia. Previous study showed abnormal appearing sigmoid/descending colon suggesting acute diverticulitis. This is not as clearly delineated on today's exam. The free air may, however, be the result of a ruptured diverticulitis and the abnormal-appearing small bowel in the LEFT abdomen may be reactive  in nature. No extravasated oral contrast to confirm small-bowel perforation. No dilated large or small bowel loops are identified elsewhere in the abdomen and pelvis Vascular/Lymphatic: Aortic atherosclerosis. Heavy atherosclerotic changes of the aortic branch vessels. No acute-appearing vascular abnormality. 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 No enlarged lymph nodes are seen. Reproductive: Uterus and bilateral adnexa are unremarkable. Other: Small amount of free fluid within the abdomen and pelvis. No circumscribed fluid collection, however, early abscess collection in the LEFT abdomen is difficult to exclude. Musculoskeletal: No acute-appearing osseous abnormality. 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 IMPRESSION: 1. New free intraperitoneal air indicating bowel perforation. 2. Abnormal-appearing small bowel within the LEFT abdomen (proximal jejunum), with amorphous/thickened walls and surrounding mesenteric inflammation. Findings are suspicious for small bowel perforation at this level, possibly indicating small bowel ischemia. Alternatively, this may represent reactive bowel wall thickening (see below). 3. Previous study showed abnormal appearing sigmoid/descending colon suggesting acute diverticulitis. This is not as clearly delineated on today's exam. The free air, however, may be the result of a ruptured diverticulitis with the abnormal-appearing small bowel in the LEFT abdomen being reactive in nature. 4. Scattered free fluid within the abdomen and pelvis. No circumscribed fluid collection, however, early abscess collection in the LEFT abdomen is difficult to exclude. 5. Small RIGHT pleural effusion with adjacent atelectasis. 6. 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 Aortic Atherosclerosis (ICD10-I70.0). Critical Value/emergent results were called by telephone at the time of interpretation on  08/10/2021 at 4:58 pm to provider Dr. Cathlean Sauer, who verbally acknowledged these results. Electronically Signed   By: Franki Cabot M.D.   On: 08/10/2021 17:00    Anti-infectives: Anti-infectives (From admission, onward)    Start     Dose/Rate Route Frequency Ordered Stop   08/06/21 2300  piperacillin-tazobactam (ZOSYN) IVPB 2.25 g        2.25 g 100 mL/hr over 30 Minutes Intravenous Every 8 hours 08/06/21 1611     08/06/21 2200  piperacillin-tazobactam (ZOSYN) IVPB 3.375 g  Status:  Discontinued        3.375 g 100 mL/hr over 30 Minutes Intravenous Every 8 hours 08/06/21 1606 08/06/21 1611   08/06/21 1530  piperacillin-tazobactam (ZOSYN) IVPB 3.375 g        3.375 g 100 mL/hr over 30 Minutes Intravenous  Once 08/06/21 1516 08/06/21 1632        Assessment/Plan Sigmoid diverticulitis with microperforation - Repeat CT 8/28 w/ SB thickening likely reactive 2/2 continued inflammation of diverticulitis w/ microperf  - WBC normalized (11.6 > 8.2 > 9.3). Some nausea after cld. Keep on CLD today - Repeat labs in AM - Hopefully will improve with conservative therapies.    FEN - sips of clears VTE - heparin subq  ID - Zosyn   ESRD on HD Hx of CVA Hx of HTN Hx of HLD Hx of A. Fib   LOS: 6 days    Jillyn Ledger , [facility] 08/12/2021, 9:45 AM Please see Amion for pager number during day hours 7:00am-4:30pm

## 2021-08-13 DIAGNOSIS — N186 End stage renal disease: Secondary | ICD-10-CM | POA: Diagnosis not present

## 2021-08-13 DIAGNOSIS — T8612 Kidney transplant failure: Secondary | ICD-10-CM | POA: Diagnosis not present

## 2021-08-13 DIAGNOSIS — K5720 Diverticulitis of large intestine with perforation and abscess without bleeding: Secondary | ICD-10-CM | POA: Diagnosis not present

## 2021-08-13 DIAGNOSIS — Z992 Dependence on renal dialysis: Secondary | ICD-10-CM | POA: Diagnosis not present

## 2021-08-13 LAB — BASIC METABOLIC PANEL
Anion gap: 19 — ABNORMAL HIGH (ref 5–15)
BUN: 30 mg/dL — ABNORMAL HIGH (ref 8–23)
CO2: 20 mmol/L — ABNORMAL LOW (ref 22–32)
Calcium: 8.1 mg/dL — ABNORMAL LOW (ref 8.9–10.3)
Chloride: 92 mmol/L — ABNORMAL LOW (ref 98–111)
Creatinine, Ser: 5.65 mg/dL — ABNORMAL HIGH (ref 0.44–1.00)
GFR, Estimated: 8 mL/min — ABNORMAL LOW (ref >60)
Glucose, Bld: 103 mg/dL — ABNORMAL HIGH (ref 70–99)
Potassium: 4.4 mmol/L (ref 3.5–5.1)
Sodium: 131 mmol/L — ABNORMAL LOW (ref 135–145)

## 2021-08-13 LAB — PHOSPHORUS: Phosphorus: 4.5 mg/dL (ref 2.5–4.6)

## 2021-08-13 LAB — CBC
HCT: 32.6 % — ABNORMAL LOW (ref 36.0–46.0)
Hemoglobin: 10.5 g/dL — ABNORMAL LOW (ref 12.0–15.0)
MCH: 29.5 pg (ref 26.0–34.0)
MCHC: 32.2 g/dL (ref 30.0–36.0)
MCV: 91.6 fL (ref 80.0–100.0)
Platelets: 292 10*3/uL (ref 150–400)
RBC: 3.56 MIL/uL — ABNORMAL LOW (ref 3.87–5.11)
RDW: 15.9 % — ABNORMAL HIGH (ref 11.5–15.5)
WBC: 11.4 10*3/uL — ABNORMAL HIGH (ref 4.0–10.5)
nRBC: 0 % (ref 0.0–0.2)

## 2021-08-13 LAB — MAGNESIUM: Magnesium: 2.3 mg/dL (ref 1.7–2.4)

## 2021-08-13 MED ORDER — BOOST / RESOURCE BREEZE PO LIQD CUSTOM
1.0000 | Freq: Three times a day (TID) | ORAL | Status: DC
Start: 2021-08-13 — End: 2021-08-14
  Administered 2021-08-13 – 2021-08-14 (×2): 1 via ORAL

## 2021-08-13 NOTE — Hospital Course (Signed)
70 year old black female community dwelling 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 1.8 4.9X 2.4 cm lateral pelvis chronic hematoma  Renal consulted and continues to follow  General surgery consulted, recommended conservative management-labs normalized started on sips of clears  Last CT abdomen pelvis 8/28 =IMPRESSION: 1. New free intraperitoneal air indicating bowel perforation. 2. Abnormal-appearing small bowel within the LEFT abdomen (proximal jejunum), with amorphous/thickened walls and surrounding mesenteric inflammation. Findings are suspicious for small bowel perforation at this level, possibly indicating small bowel ischemia. Alternatively, this may represent reactive bowel wall thickening (see below). 3. Previous study showed abnormal appearing sigmoid/descending colon suggesting acute diverticulitis. This is not as clearly delineated on today's exam. The free air, however, may be the result of a ruptured diverticulitis with the abnormal-appearing small bowel in the LEFT abdomen being reactive in nature. 4. Scattered free fluid within the abdomen and pelvis. No circumscribed fluid collection, however, early abscess collection in the LEFT abdomen is difficult to exclude. 5. Small RIGHT pleural effusion with adjacent atelectasis. 6. 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

## 2021-08-13 NOTE — Progress Notes (Addendum)
PROGRESS NOTE   [name]  FVC:[id number] DOB: June 01, 1951 DOA: 08/06/2021 PCP: [name], MD  Brief Narrative:  70 year old black female community dwelling 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 1.8 4.9X 2.4 cm lateral pelvis chronic hematoma  Renal consulted and continues to follow  General surgery consulted, recommended conservative management-labs normalized started on sips of clears  Hospital-Problem based course  Acute sigmoid diverticulitis + microperforation Continuing Zosyn, clear liquid diet at this time Watch white count given slight rise today-defer imaging to surgery Pain control Dilaudid 1 mg every 4 as needed ESRD MWF/MGUS/renal transplant 1997 Left renal artery aneurysm Continue Sensipar 30 every morning hold renal diet until taking full diet Defer to renal dialysis management etc. Continue Auryxia when taking p.o. On admit was volume overloaded which is now completely resolved Paroxysmal A. fib prior VT DCCV 04/02/2020--not on anticoagulation secondary to bleeding Seemed rate controlled  Outpatient monitoring Anxiety Continue Xanax 0.25 daily as needed anxiety 5 x 3 cm lateral pelvic chronic hematoma Coincidently found--suppose this can be imaged in the outpatient setting not emergently   DVT prophylaxis: Subcu heparin Code Status: Full Family Communication: None present currently at the bedside Disposition:  Status is: Inpatient  Remains inpatient appropriate because:Hemodynamically unstable and Unsafe d/c plan  Dispo: The patient is from:  Home              Anticipated d/c is to:  Possible home with home health will need to be ambulated              Patient currently is not medically stable to d/c.   Difficult to place patient No       Consultants:  General surgery Renal  Procedures: n  Antimicrobials: Zosyn   Subjective: Doing fair tolerating liquid diet okay no pain no fever no nausea passing gas had a stool today  Objective: Vitals:   08/13/21 1100 08/13/21 1130 08/13/21 1216 08/13/21 1224  BP: 105/72 (!) 117/55 (!) 95/28 115/62  Pulse: 74 83 83 85  Resp:  16 17 17   Temp:  98 F (36.7 C) 98.3 F (36.8 C) 98.4 F (36.9 C)  TempSrc:  Oral Oral Oral  SpO2:  96% 97% 93%  Weight:  52.8 kg      Intake/Output Summary (Last 24 hours) at 08/13/2021 1455 Last data filed at 08/13/2021 1130 Gross per 24 hour  Intake --  Output 0 ml  Net 0 ml   Filed Weights   08/11/21 1137 08/13/21 0820 08/13/21 1130  Weight: 51.2 kg 52.8 kg 52.8 kg    Examination:  Alert coherent black female looking about stated age no distress EOMI NCAT arcus senilis present no icterus no pallor Neck soft supple S1-S2 no murmur no rub no gallop Abdomen soft no rebound she does have areas which are bleeding likely from her heparin injections Neurologically intact moving 4 limbs equally without deficit   Data Reviewed: personally reviewed   CBC    Component Value Date/Time   WBC 11.4 (H) 08/13/2021 0031   RBC 3.56 (L) 08/13/2021 0031   HGB 10.5 (L) 08/13/2021 0031   HGB 10.6 (L) 07/13/2018 0814   HGB  8.7 (L) 09/24/2011 1038   HCT 32.6 (L) 08/13/2021 0031   HCT 33.0 (L) 07/13/2018 0814   HCT 27.3 (L) 09/24/2011 1038   PLT 292 08/13/2021 0031   PLT 203 07/13/2018 0814   MCV 91.6 08/13/2021 0031   MCV 91 07/13/2018 0814   MCV 94.5 09/24/2011 1038   MCH 29.5 08/13/2021 0031   MCHC 32.2 08/13/2021 0031   RDW 15.9 (H) 08/13/2021 0031   RDW 16.9 (H) 07/13/2018 0814   RDW 20.1 (H) 09/24/2011 1038   LYMPHSABS 1.3 08/06/2021  0951   LYMPHSABS 1.1 09/24/2011 1038   MONOABS 0.5 08/06/2021 0951   MONOABS 0.6 09/24/2011 1038   EOSABS 0.2 08/06/2021 0951   EOSABS 0.1 09/24/2011 1038   BASOSABS 0.0 08/06/2021 0951   BASOSABS 0.0 09/24/2011 1038   CMP Latest Ref Rng & Units 08/13/2021 08/12/2021 08/11/2021  Glucose 70 - 99 mg/dL 103(H) 81 76  BUN 8 - 23 mg/dL 30(H) 20 41(H)  Creatinine 0.44 - 1.00 mg/dL 5.65(H) 4.40(H) 6.86(H)  Sodium 135 - 145 mmol/L 131(L) 133(L) 127(L)  Potassium 3.5 - 5.1 mmol/L 4.4 4.1 3.7  Chloride 98 - 111 mmol/L 92(L) 95(L) 88(L)  CO2 22 - 32 mmol/L 20(L) 21(L) 22  Calcium 8.9 - 10.3 mg/dL 8.1(L) 7.9(L) 7.3(L)  Total Protein 6.5 - 8.1 g/dL - - -  Total Bilirubin 0.3 - 1.2 mg/dL - - -  Alkaline Phos 38 - 126 U/L - - -  AST 15 - 41 U/L - - -  ALT 0 - 44 U/L - - -     Radiology Studies: No results found.   Scheduled Meds:  aspirin EC  81 mg Oral Daily   atorvastatin  80 mg Oral Daily   Chlorhexidine Gluconate Cloth  6 each Topical Q0600   cinacalcet  30 mg Oral Q breakfast   feeding supplement  1 Container Oral TID BM   heparin  5,000 Units Subcutaneous Q8H   multivitamin  1 tablet Oral QHS   pantoprazole  40 mg Oral Daily   sodium chloride flush  3 mL Intravenous Q12H   zolpidem  5 mg Oral QHS   Continuous Infusions:  piperacillin-tazobactam (ZOSYN)  IV 2.25 g (08/13/21 1410)     LOS: 7 days   Time spent: 65  Nita Sells, MD [facility] To contact the attending provider between 7A-7P or the covering provider during after hours 7P-7A, please log into the web site [email or web address] and access using universal [place] password for that web site. If you do not have the password, please call the hospital operator.  08/13/2021, 2:55 PM

## 2021-08-13 NOTE — Progress Notes (Signed)
Subjective: Seen during hemodialysis Abdominal pain is improved. She does have some gas cramp like discomfort. Passing flatus and liquid BM. No nausea or emesis today. She is hungry.   Objective: Vital signs in last 24 hours: Temp:  [97.4 F (36.3 C)-99 F (37.2 C)] 98.1 F (36.7 C) (08/31 0820) Pulse Rate:  [76-99] 82 (08/31 1030) Resp:  [17-18] 18 (08/31 0820) BP: (99-141)/(25-71) 141/25 (08/31 1030) SpO2:  [92 %-100 %] 95 % (08/31 0820) Weight:  [52.8 kg] 52.8 kg (08/31 0820) Last BM Date: 08/12/21  Intake/Output from previous day: No intake/output data recorded. Intake/Output this shift: No intake/output data recorded.  PE: Gen:  Alert, NAD, pleasant Pulm:  Rate and effort normal Abd: Soft, ND, very mild TTP diffusely, +BS Skin: no rashes noted, warm and dry  Lab Results:  Recent Labs    08/12/21 0111 08/13/21 0031  WBC 9.3 11.4*  HGB 10.8* 10.5*  HCT 32.8* 32.6*  PLT 245 292    BMET Recent Labs    08/12/21 0111 08/13/21 0031  NA 133* 131*  K 4.1 4.4  CL 95* 92*  CO2 21* 20*  GLUCOSE 81 103*  BUN 20 30*  CREATININE 4.40* 5.65*  CALCIUM 7.9* 8.1*    PT/INR No results for input(s): LABPROT, INR in the last 72 hours. CMP     Component Value Date/Time   NA 131 (L) 08/13/2021 0031   NA 140 07/13/2018 0814   K 4.4 08/13/2021 0031   CL 92 (L) 08/13/2021 0031   CO2 20 (L) 08/13/2021 0031   GLUCOSE 103 (H) 08/13/2021 0031   BUN 30 (H) 08/13/2021 0031   BUN 45 (H) 07/13/2018 0814   CREATININE 5.65 (H) 08/13/2021 0031   CALCIUM 8.1 (L) 08/13/2021 0031   CALCIUM 9.6 08/03/2011 1005   PROT 6.9 08/06/2021 0951   PROT 6.9 07/13/2018 0814   ALBUMIN 2.4 (L) 08/09/2021 0204   ALBUMIN 4.1 07/13/2018 0814   AST 20 08/06/2021 0951   ALT 11 08/06/2021 0951   ALKPHOS 149 (H) 08/06/2021 0951   BILITOT 1.1 08/06/2021 0951   BILITOT 0.4 07/13/2018 0814   GFRNONAA 8 (L) 08/13/2021 0031   GFRAA 7 (L) 07/26/2020 0920   Lipase     Component Value  Date/Time   LIPASE 40 08/06/2021 0951    Studies/Results: No results found.  Anti-infectives: Anti-infectives (From admission, onward)    Start     Dose/Rate Route Frequency Ordered Stop   08/06/21 2300  piperacillin-tazobactam (ZOSYN) IVPB 2.25 g        2.25 g 100 mL/hr over 30 Minutes Intravenous Every 8 hours 08/06/21 1611     08/06/21 2200  piperacillin-tazobactam (ZOSYN) IVPB 3.375 g  Status:  Discontinued        3.375 g 100 mL/hr over 30 Minutes Intravenous Every 8 hours 08/06/21 1606 08/06/21 1611   08/06/21 1530  piperacillin-tazobactam (ZOSYN) IVPB 3.375 g        3.375 g 100 mL/hr over 30 Minutes Intravenous  Once 08/06/21 1516 08/06/21 1632        Assessment/Plan Sigmoid diverticulitis with microperforation - Repeat CT 8/28 w/ SB thickening likely reactive 2/2 continued inflammation of diverticulitis w/ microperf  - WBC up to 11.4 this am (11.6 > 8.2 > 9.3 > 11.4). afebrile. No nausea today. Clear liquid diet - Repeat labs in AM - Hopefully will improve with conservative therapies.    FEN - clear liquid diet VTE - heparin subq  ID -  Zosyn   ESRD on HD Hx of CVA Hx of HTN Hx of HLD Hx of A. Fib   LOS: 7 days    Winferd Humphrey , [facility] 08/13/2021, 10:31 AM Please see Amion for pager number during day hours 7:00am-4:30pm

## 2021-08-13 NOTE — Progress Notes (Signed)
[facility] Progress Note   Subjective: Seen on HD. No C/Os.   Objective Vitals:   08/13/21 1100 08/13/21 1130 08/13/21 1216 08/13/21 1224  BP: 105/72 (!) 117/55 (!) 95/28 115/62  Pulse: 74 83 83 85  Resp:  16 17 17   Temp:  98 F (36.7 C) 98.3 F (36.8 C) 98.4 F (36.9 C)  TempSrc:  Oral Oral Oral  SpO2:  96% 97% 93%  Weight:  52.8 kg     Physical Exam General: Pleasant elderly female in NAD Heart: S1,S2 RRR Lungs: CTAB Abdomen: NABS, flat, NT Extremities: No LE edema Dialysis Access: R AVG cannulated    Additional Objective Labs: Basic Metabolic Panel: Recent Labs  Lab 08/08/21 0453 08/09/21 0204 08/10/21 0042 08/11/21 0359 08/12/21 0111 08/13/21 0031  NA 136 133*   < > 127* 133* 131*  K 3.9 3.9   < > 3.7 4.1 4.4  CL 96* 94*   < > 88* 95* 92*  CO2 27 26   < > 22 21* 20*  GLUCOSE 57* 58*   < > 76 81 103*  BUN 17 27*   < > 41* 20 30*  CREATININE 3.79* 4.91*   < > 6.86* 4.40* 5.65*  CALCIUM 8.7* 7.4*   < > 7.3* 7.9* 8.1*  PHOS 4.2 2.8  --   --   --  4.5   < > = values in this interval not displayed.   Liver Function Tests: Recent Labs  Lab 08/07/21 0131 08/08/21 0453 08/09/21 0204  ALBUMIN 2.7* 2.5* 2.4*   No results for input(s): LIPASE, AMYLASE in the last 168 hours. CBC: Recent Labs  Lab 08/09/21 0204 08/10/21 0042 08/11/21 0359 08/12/21 0111 08/13/21 0031  WBC 9.0 11.6* 8.2 9.3 11.4*  HGB 10.6* 10.7* 10.2* 10.8* 10.5*  HCT 32.2* 32.5* 30.2* 32.8* 32.6*  MCV 92.0 91.5 89.1 92.4 91.6  PLT 231 217 249 245 292   Blood Culture    Component Value Date/Time   SDES BLOOD RIGHT HAND 11/19/2015 0937   SPECREQUEST  11/19/2015 0937    BOTTLES DRAWN AEROBIC AND ANAEROBIC 10CCS BLUE 5CCS RED   CULT NO GROWTH 5 DAYS 11/19/2015 0937   REPTSTATUS 11/24/2015 FINAL 11/19/2015 0937    Cardiac Enzymes: No results for input(s): CKTOTAL, CKMB, CKMBINDEX, TROPONINI in the last 168 hours. CBG: Recent Labs  Lab 08/08/21 0613  08/08/21 0642 08/08/21 2040 08/09/21 0529 08/09/21 1547  GLUCAP 52* 156* 80 84 140*   Iron Studies: No results for input(s): IRON, TIBC, TRANSFERRIN, FERRITIN in the last 72 hours. @lablastinr3 @ Studies/Results: No results found. Medications:  piperacillin-tazobactam (ZOSYN)  IV 2.25 g (08/13/21 1950)    aspirin EC  81 mg Oral Daily   atorvastatin  80 mg Oral Daily   Chlorhexidine Gluconate Cloth  6 each Topical Q0600   cinacalcet  30 mg Oral Q breakfast   feeding supplement  1 Container Oral TID BM   heparin  5,000 Units Subcutaneous Q8H   multivitamin  1 tablet Oral QHS   pantoprazole  40 mg Oral Daily   sodium chloride flush  3 mL Intravenous Q12H   zolpidem  5 mg Oral QHS     Dialysis Orders: [facility] , MWF 4-hour 2K, 2 calcium bath, EDW 49.5  No heparin, RUA AVGG No ESA  ,hgb 11.2, 11.8/Mircera 60 7/18 Sensipar 30 Hectorol 4   Problem/Plan: Sigmoid diverticulitis with microperforation- on IV Zosyn and CCS following.  WBC 9.3 this a.m ESRD -normal HD MWF  Next HD 08/15/2021 Hypertension/volume -yesterday start of dialysis BP low initially 80s and back up to 103 with albumin  decrease d UF goal 2.5 to 1 L, total UF 1002 mL yesterday post weight 51.2 but bed weight, has been n.p.o. since admission does not appear volume overloaded.  Per exam, admission CXR-pulmonary vascular congestion/clinically no shortness of breath and stable, keep even tomorrow on dialysis try to obtain standing weights. 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  Continue Hectorol.  Resume sensipar and binders once diet advanced.  History of CVA-on statin and aspirin History of proximal A. Fib/sinus rhythm no anticoagulation. Stable HO anxiety/depression-per admit on Xanax and BuSpar  History of CAD   Siddarth Hsiung H. Jaydalyn Demattia NP-C 08/13/2021, 1:23 PM  [employer] [phone]

## 2021-08-14 DIAGNOSIS — K5720 Diverticulitis of large intestine with perforation and abscess without bleeding: Secondary | ICD-10-CM | POA: Diagnosis not present

## 2021-08-14 LAB — CBC
HCT: 31.3 % — ABNORMAL LOW (ref 36.0–46.0)
Hemoglobin: 10.6 g/dL — ABNORMAL LOW (ref 12.0–15.0)
MCH: 30.1 pg (ref 26.0–34.0)
MCHC: 33.9 g/dL (ref 30.0–36.0)
MCV: 88.9 fL (ref 80.0–100.0)
Platelets: 283 10*3/uL (ref 150–400)
RBC: 3.52 MIL/uL — ABNORMAL LOW (ref 3.87–5.11)
RDW: 15.7 % — ABNORMAL HIGH (ref 11.5–15.5)
WBC: 9.2 10*3/uL (ref 4.0–10.5)
nRBC: 0 % (ref 0.0–0.2)

## 2021-08-14 LAB — BASIC METABOLIC PANEL
Anion gap: 14 (ref 5–15)
BUN: 11 mg/dL (ref 8–23)
CO2: 24 mmol/L (ref 22–32)
Calcium: 8.3 mg/dL — ABNORMAL LOW (ref 8.9–10.3)
Chloride: 95 mmol/L — ABNORMAL LOW (ref 98–111)
Creatinine, Ser: 3.34 mg/dL — ABNORMAL HIGH (ref 0.44–1.00)
GFR, Estimated: 14 mL/min — ABNORMAL LOW (ref >60)
Glucose, Bld: 115 mg/dL — ABNORMAL HIGH (ref 70–99)
Potassium: 2.9 mmol/L — ABNORMAL LOW (ref 3.5–5.1)
Sodium: 133 mmol/L — ABNORMAL LOW (ref 135–145)

## 2021-08-14 MED ORDER — NEPRO/CARBSTEADY PO LIQD
237.0000 mL | Freq: Two times a day (BID) | ORAL | Status: DC
  Administered 2021-08-14 – 2021-08-20 (×12): 237 mL via ORAL

## 2021-08-14 MED ORDER — ENSURE ENLIVE PO LIQD
237.0000 mL | Freq: Two times a day (BID) | ORAL | Status: DC
  Administered 2021-08-14 (×2): 237 mL via ORAL

## 2021-08-14 MED ORDER — POTASSIUM CHLORIDE 10 MEQ/100ML IV SOLN
10.0000 meq | INTRAVENOUS | Status: AC
Start: 2021-08-14 — End: 2021-08-14
  Administered 2021-08-14 (×4): 10 meq via INTRAVENOUS
  Filled 2021-08-14 (×4): qty 100

## 2021-08-14 NOTE — Progress Notes (Signed)
Nutrition Follow-up  DOCUMENTATION CODES:   Severe malnutrition in context of chronic illness  INTERVENTION:   Nepro Shake po BID, each supplement provides 425 kcal and 19 grams protein  Continue Renal MVI daily  Recommend Dysphagia 3 (Mechanical Soft/Easy to chew) diet once advanced  NUTRITION DIAGNOSIS:   Severe Malnutrition related to chronic illness as evidenced by severe fat depletion, severe muscle depletion, energy intake < 75% for > or equal to 1 month.  Being addressed as diet being advanced, supplements  GOAL:   Patient will meet greater than or equal to 90% of their needs  Progressing  MONITOR:   PO intake, Supplement acceptance, Labs, Weight trends  REASON FOR ASSESSMENT:   Consult Assessment of nutrition requirement/status  ASSESSMENT:   70 yo female admitted with sigmoid diverticulitis with microperforation PMH includes ESRD on iHD, HLD, GERD, stroke, CHF  Tolerating CL, no N/V. +flatus, +BM, Some abdominal pain. Pt ate 30% at breakfast this AM  Current wt 52.8 kg; outpatient weight 49.5 kg  Labs: sodium 133 (L), potassium 2.9 (L), phosphorus 4.5 (wdl) Meds: potassium chloride, sensipar   Diet Order:   Diet Order             Diet full liquid Room service appropriate? Yes; Fluid consistency: Thin  Diet effective now                   EDUCATION NEEDS:   Not appropriate for education at this time  Skin:  Skin Assessment: Reviewed RN Assessment  Last BM:  9/1  Height:   Ht Readings from Last 1 Encounters:  07/17/21 5\' 2"  (1.575 m)    Weight:   Wt Readings from Last 1 Encounters:  08/13/21 52.8 kg    BMI:  Body mass index is 21.29 kg/m.  Estimated Nutritional Needs:   Kcal:  1600-1800 kcals  Protein:  75-90 g  Fluid:  1000 mL plus UOP   Kerman Passey MS, RDN, LDN, CNSC Registered Dietitian III Clinical Nutrition RD Pager and On-Call Pager Number Located in [place]

## 2021-08-14 NOTE — Progress Notes (Signed)
[facility] Progress Note   Subjective: Seen in room. Still C/O abdominal pain but having BMs. HD tomorrow on schedule.   Objective Vitals:   08/13/21 1536 08/13/21 2010 08/14/21 0431 08/14/21 0839  BP: (!) 112/31 119/65 (!) 113/45 (!) 134/58  Pulse: (!) 101 92 88 97  Resp: 18 17 17 16   Temp: (!) 97.5 F (36.4 C) 98.6 F (37 C) 97.8 F (36.6 C) 97.9 F (36.6 C)  TempSrc: Oral Oral  Oral  SpO2: 93% 100% 94% 99%  Weight:       Physical Exam General: Pleasant elderly female in NAD Heart: S1,S2 RRR Lungs: CTAB Abdomen: NABS, flat, NT Extremities: No LE edema Dialysis Access: R AVG +T/B    Additional Objective Labs: Basic Metabolic Panel: Recent Labs  Lab 08/08/21 0453 08/09/21 0204 08/10/21 0042 08/12/21 0111 08/13/21 0031 08/14/21 0352  NA 136 133*   < > 133* 131* 133*  K 3.9 3.9   < > 4.1 4.4 2.9*  CL 96* 94*   < > 95* 92* 95*  CO2 27 26   < > 21* 20* 24  GLUCOSE 57* 58*   < > 81 103* 115*  BUN 17 27*   < > 20 30* 11  CREATININE 3.79* 4.91*   < > 4.40* 5.65* 3.34*  CALCIUM 8.7* 7.4*   < > 7.9* 8.1* 8.3*  PHOS 4.2 2.8  --   --  4.5  --    < > = values in this interval not displayed.   Liver Function Tests: Recent Labs  Lab 08/08/21 0453 08/09/21 0204  ALBUMIN 2.5* 2.4*   No results for input(s): LIPASE, AMYLASE in the last 168 hours. CBC: Recent Labs  Lab 08/10/21 0042 08/11/21 0359 08/12/21 0111 08/13/21 0031 08/14/21 0352  WBC 11.6* 8.2 9.3 11.4* 9.2  HGB 10.7* 10.2* 10.8* 10.5* 10.6*  HCT 32.5* 30.2* 32.8* 32.6* 31.3*  MCV 91.5 89.1 92.4 91.6 88.9  PLT 217 249 245 292 283   Blood Culture    Component Value Date/Time   SDES BLOOD RIGHT HAND 11/19/2015 0937   SPECREQUEST  11/19/2015 0937    BOTTLES DRAWN AEROBIC AND ANAEROBIC 10CCS BLUE 5CCS RED   CULT NO GROWTH 5 DAYS 11/19/2015 0937   REPTSTATUS 11/24/2015 FINAL 11/19/2015 0937    Cardiac Enzymes: No results for input(s): CKTOTAL, CKMB, CKMBINDEX, TROPONINI in the  last 168 hours. CBG: Recent Labs  Lab 08/08/21 0613 08/08/21 0642 08/08/21 2040 08/09/21 0529 08/09/21 1547  GLUCAP 52* 156* 80 84 140*   Iron Studies: No results for input(s): IRON, TIBC, TRANSFERRIN, FERRITIN in the last 72 hours. @lablastinr3 @ Studies/Results: No results found. Medications:  piperacillin-tazobactam (ZOSYN)  IV 2.25 g (08/14/21 0551)   potassium chloride 10 mEq (08/14/21 1034)    Chlorhexidine Gluconate Cloth  6 each Topical Q0600   cinacalcet  30 mg Oral Q breakfast   feeding supplement  237 mL Oral BID BM   feeding supplement (NEPRO CARB STEADY)  237 mL Oral BID BM   heparin  5,000 Units Subcutaneous Q8H   pantoprazole  40 mg Oral Daily   sodium chloride flush  3 mL Intravenous Q12H   zolpidem  5 mg Oral QHS     Dialysis Orders: [facility] , MWF  4 hrs 350/600 49.5 kg  2.0K/2.0 Ca  -No heparin, RUA AVGG -No ESA  ,hgb 11.2, 11.8/Mircera 60 7/18 -Sensipar 30 mg PO TIW -Hectorol 4 mcg IV TIW   Problem/Plan: Sigmoid diverticulitis with microperforation-  on IV Zosyn and CCS following.  WBC 9.2 this a.m ESRD -normal HD MWF Next HD 08/15/2021 Hypertension/volume -HD 08/13/2021 Net UF 0. Post wt 52.8 kg. She is above OP EDW. Needs standing wts if possible UF as tolerated in HD tomorrow.  Anemia of ESRD-Hgb 10.6 no ESA needed for now (last given Mircera 60 on 8/33/38) Metabolic bone disease - corrected Ca and phos in goal.  Continue Hectorol.  Resume sensipar and binders once diet advanced.  History of CVA-on statin and aspirin History of proximal A. Fib/sinus rhythm no anticoagulation. Stable HO anxiety/depression-per admit on Xanax and BuSpar  History of CAD   Bill Mcvey H. Katey Barrie NP-C 08/14/2021, 11:42 AM  [employer] [phone]

## 2021-08-14 NOTE — Progress Notes (Addendum)
Subjective: CC: Patient reports that her pain has improved from yesterday. Having some mild lower abdominal cramping (L>R) intermittently. No PRN pain meds yesterday. No n/v. Tolerating cld. Passing flatus. BM today.   Objective: Vital signs in last 24 hours: Temp:  [97.5 F (36.4 C)-98.6 F (37 C)] 97.8 F (36.6 C) (09/01 0431) Pulse Rate:  [54-101] 88 (09/01 0431) Resp:  [16-18] 17 (09/01 0431) BP: (95-141)/(25-72) 113/45 (09/01 0431) SpO2:  [93 %-100 %] 94 % (09/01 0431) Weight:  [52.8 kg] 52.8 kg (08/31 1130) Last BM Date: 08/14/21  Intake/Output from previous day: No intake/output data recorded. Intake/Output this shift: No intake/output data recorded.  PE: Gen:  Alert, NAD, pleasant Pulm:  Rate and effort normal Abd: Soft, ND, very mild TTP in LLQ that is improved from my last exam, +BS Skin: no rashes noted, warm and dry  Lab Results:  Recent Labs    08/13/21 0031 08/14/21 0352  WBC 11.4* 9.2  HGB 10.5* 10.6*  HCT 32.6* 31.3*  PLT 292 283   BMET Recent Labs    08/13/21 0031 08/14/21 0352  NA 131* 133*  K 4.4 2.9*  CL 92* 95*  CO2 20* 24  GLUCOSE 103* 115*  BUN 30* 11  CREATININE 5.65* 3.34*  CALCIUM 8.1* 8.3*   PT/INR No results for input(s): LABPROT, INR in the last 72 hours. CMP     Component Value Date/Time   NA 133 (L) 08/14/2021 0352   NA 140 07/13/2018 0814   K 2.9 (L) 08/14/2021 0352   CL 95 (L) 08/14/2021 0352   CO2 24 08/14/2021 0352   GLUCOSE 115 (H) 08/14/2021 0352   BUN 11 08/14/2021 0352   BUN 45 (H) 07/13/2018 0814   CREATININE 3.34 (H) 08/14/2021 0352   CALCIUM 8.3 (L) 08/14/2021 0352   CALCIUM 9.6 08/03/2011 1005   PROT 6.9 08/06/2021 0951   PROT 6.9 07/13/2018 0814   ALBUMIN 2.4 (L) 08/09/2021 0204   ALBUMIN 4.1 07/13/2018 0814   AST 20 08/06/2021 0951   ALT 11 08/06/2021 0951   ALKPHOS 149 (H) 08/06/2021 0951   BILITOT 1.1 08/06/2021 0951   BILITOT 0.4 07/13/2018 0814   GFRNONAA 14 (L) 08/14/2021 0352    GFRAA 7 (L) 07/26/2020 0920   Lipase     Component Value Date/Time   LIPASE 40 08/06/2021 0951    Studies/Results: No results found.  Anti-infectives: Anti-infectives (From admission, onward)    Start     Dose/Rate Route Frequency Ordered Stop   08/06/21 2300  piperacillin-tazobactam (ZOSYN) IVPB 2.25 g        2.25 g 100 mL/hr over 30 Minutes Intravenous Every 8 hours 08/06/21 1611     08/06/21 2200  piperacillin-tazobactam (ZOSYN) IVPB 3.375 g  Status:  Discontinued        3.375 g 100 mL/hr over 30 Minutes Intravenous Every 8 hours 08/06/21 1606 08/06/21 1611   08/06/21 1530  piperacillin-tazobactam (ZOSYN) IVPB 3.375 g        3.375 g 100 mL/hr over 30 Minutes Intravenous  Once 08/06/21 1516 08/06/21 1632        Assessment/Plan Sigmoid diverticulitis with microperforation - Repeat CT 8/28 w/ SB thickening likely reactive 2/2 continued inflammation of diverticulitis w/ microperf  - WBC down/wnl. Tolerating cld w/o n/v. Having bowel function. Less tender on exam. Adv diet.  - Repeat labs in AM - Hopefully will improve with conservative therapies.    FEN - FLD + shakes  VTE -  heparin subq  ID - Zosyn   ESRD on HD Hx of CVA Hx of HTN Hx of HLD Hx of A. Fib   LOS: 8 days    Jillyn Ledger , [facility] 08/14/2021, 8:12 AM Please see Amion for pager number during day hours 7:00am-4:30pm

## 2021-08-14 NOTE — Plan of Care (Signed)
  Problem: Education: Goal: Knowledge of General Education information will improve Description: Including pain rating scale, medication(s)/side effects and non-pharmacologic comfort measures Outcome: Progressing   Problem: Clinical Measurements: Goal: Ability to maintain clinical measurements within normal limits will improve Outcome: Progressing Goal: Diagnostic test results will improve Outcome: Progressing Goal: Respiratory complications will improve Outcome: Completed/Met Goal: Cardiovascular complication will be avoided Outcome: Progressing

## 2021-08-14 NOTE — Progress Notes (Signed)
PROGRESS NOTE   [name]  EVO:[id number] DOB: [birth year] DOA: 08/06/2021 PCP: [name], MD  Brief Narrative:  70 year old black female community dwelling 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 Continue Auryxia when taking p.o. On admit was volume overloaded which is now completely resolved Paroxysmal A. fib prior VT DCCV 04/02/2020--not on anticoagulation secondary to bleeding Seemed rate controlled without intervention Outpatient monitoring Severe hypokalemia 4 runs K given 9/1, recheck labs in am Anxiety Continue Xanax 0.25 daily as needed anxiety 5 x 3 cm lateral pelvic chronic hematoma Coincidently found--suppose this can be imaged in the outpatient setting not emergently   DVT prophylaxis: Subcu heparin Code Status: Full Family Communication: None present currently at the bedside Disposition:  Status is: Inpatient  Remains inpatient appropriate because:Hemodynamically unstable and Unsafe d/c  plan  Dispo: The patient is from: Home              Anticipated d/c is to:  Possible home with home health will need to be ambulated              Patient currently is not medically stable to d/c.   Difficult to place patient No       Consultants:  General surgery Renal  Procedures: n  Antimicrobials: Zosyn   Subjective:  Tolerating diet, no cp Some diarr, no chills rigors  Objective: Vitals:   08/13/21 1536 08/13/21 2010 08/14/21 0431 08/14/21 0839  BP: (!) 112/31 119/65 (!) 113/45 (!) 134/58  Pulse: (!) 101 92 88 97  Resp: 18 17 17 16   Temp: (!) 97.5 F (36.4 C) 98.6 F (37 C) 97.8 F (36.6 C) 97.9 F (36.6 C)  TempSrc: Oral Oral  Oral  SpO2: 93% 100% 94% 99%  Weight:        Intake/Output Summary (Last 24 hours) at 08/14/2021 1321 Last data filed at 08/14/2021 1200 Gross per 24 hour  Intake 480 ml  Output --  Net 480 ml    Filed Weights   08/11/21 1137 08/13/21 0820 08/13/21 1130  Weight: 51.2 kg 52.8 kg 52.8 kg    Examination:  black female looking about stated age no distress  EOMI NCAT arcus senilis present no icterus no pallor Neck soft supple S1-S2 no murmur no rub no gallop Abdomen soft no rebound --some areas in LQ show mild bleed from heparin shots Neurologically intact moving 4 limbs equally without deficit   Data Reviewed: personally reviewed   CBC    Component Value Date/Time   WBC 9.2 08/14/2021 0352   RBC 3.52 (L) 08/14/2021 0352   HGB 10.6 (L) 08/14/2021 0352   HGB 10.6 (L) 07/13/2018 0814   HGB 8.7 (L) 09/24/2011  1038   HCT 31.3 (L) 08/14/2021 0352   HCT 33.0 (L) 07/13/2018 0814   HCT 27.3 (L) 09/24/2011 1038   PLT 283 08/14/2021 0352   PLT 203 07/13/2018 0814   MCV 88.9 08/14/2021 0352   MCV 91 07/13/2018 0814   MCV 94.5 09/24/2011 1038   MCH 30.1 08/14/2021 0352   MCHC 33.9 08/14/2021 0352   RDW 15.7 (H) 08/14/2021 0352   RDW 16.9 (H) 07/13/2018 0814   RDW 20.1 (H) 09/24/2011 1038   LYMPHSABS 1.3 08/06/2021 0951    LYMPHSABS 1.1 09/24/2011 1038   MONOABS 0.5 08/06/2021 0951   MONOABS 0.6 09/24/2011 1038   EOSABS 0.2 08/06/2021 0951   EOSABS 0.1 09/24/2011 1038   BASOSABS 0.0 08/06/2021 0951   BASOSABS 0.0 09/24/2011 1038   CMP Latest Ref Rng & Units 08/14/2021 08/13/2021 08/12/2021  Glucose 70 - 99 mg/dL 115(H) 103(H) 81  BUN 8 - 23 mg/dL 11 30(H) 20  Creatinine 0.44 - 1.00 mg/dL 3.34(H) 5.65(H) 4.40(H)  Sodium 135 - 145 mmol/L 133(L) 131(L) 133(L)  Potassium 3.5 - 5.1 mmol/L 2.9(L) 4.4 4.1  Chloride 98 - 111 mmol/L 95(L) 92(L) 95(L)  CO2 22 - 32 mmol/L 24 20(L) 21(L)  Calcium 8.9 - 10.3 mg/dL 8.3(L) 8.1(L) 7.9(L)  Total Protein 6.5 - 8.1 g/dL - - -  Total Bilirubin 0.3 - 1.2 mg/dL - - -  Alkaline Phos 38 - 126 U/L - - -  AST 15 - 41 U/L - - -  ALT 0 - 44 U/L - - -     Radiology Studies: No results found.   Scheduled Meds:  Chlorhexidine Gluconate Cloth  6 each Topical Q0600   cinacalcet  30 mg Oral Q breakfast   feeding supplement  237 mL Oral BID BM   feeding supplement (NEPRO CARB STEADY)  237 mL Oral BID BM   heparin  5,000 Units Subcutaneous Q8H   pantoprazole  40 mg Oral Daily   sodium chloride flush  3 mL Intravenous Q12H   zolpidem  5 mg Oral QHS   Continuous Infusions:  piperacillin-tazobactam (ZOSYN)  IV 2.25 g (08/14/21 0551)     LOS: 8 days   Time spent: [place], MD [facility] To contact the attending provider between 7A-7P or the covering provider during after hours 7P-7A, please log into the web site [email or web address] and access using universal [place] password for that web site. If you do not have the password, please call the hospital operator.  08/14/2021, 1:21 PM

## 2021-08-15 LAB — RENAL FUNCTION PANEL
Albumin: 2.3 g/dL — ABNORMAL LOW (ref 3.5–5.0)
Anion gap: 13 (ref 5–15)
BUN: 17 mg/dL (ref 8–23)
CO2: 21 mmol/L — ABNORMAL LOW (ref 22–32)
Calcium: 7.9 mg/dL — ABNORMAL LOW (ref 8.9–10.3)
Chloride: 97 mmol/L — ABNORMAL LOW (ref 98–111)
Creatinine, Ser: 4.59 mg/dL — ABNORMAL HIGH (ref 0.44–1.00)
GFR, Estimated: 10 mL/min — ABNORMAL LOW (ref >60)
Glucose, Bld: 105 mg/dL — ABNORMAL HIGH (ref 70–99)
Phosphorus: 2.2 mg/dL — ABNORMAL LOW (ref 2.5–4.6)
Potassium: 3.6 mmol/L (ref 3.5–5.1)
Sodium: 131 mmol/L — ABNORMAL LOW (ref 135–145)

## 2021-08-15 LAB — CBC
HCT: 31 % — ABNORMAL LOW (ref 36.0–46.0)
Hemoglobin: 10.4 g/dL — ABNORMAL LOW (ref 12.0–15.0)
MCH: 30.1 pg (ref 26.0–34.0)
MCHC: 33.5 g/dL (ref 30.0–36.0)
MCV: 89.6 fL (ref 80.0–100.0)
Platelets: 273 10*3/uL (ref 150–400)
RBC: 3.46 MIL/uL — ABNORMAL LOW (ref 3.87–5.11)
RDW: 15.8 % — ABNORMAL HIGH (ref 11.5–15.5)
WBC: 8.6 10*3/uL (ref 4.0–10.5)
nRBC: 0 % (ref 0.0–0.2)

## 2021-08-15 MED ORDER — CINACALCET HCL 30 MG PO TABS
30.0000 mg | ORAL_TABLET | ORAL | Status: DC
  Administered 2021-08-18: 30 mg via ORAL
  Filled 2021-08-15 (×2): qty 1

## 2021-08-15 MED ORDER — AMOXICILLIN-POT CLAVULANATE 875-125 MG PO TABS
1.0000 | ORAL_TABLET | Freq: Two times a day (BID) | ORAL | Status: DC
  Administered 2021-08-15: 1 via ORAL
  Filled 2021-08-15: qty 1

## 2021-08-15 MED ORDER — AMOXICILLIN-POT CLAVULANATE 500-125 MG PO TABS
1.0000 | ORAL_TABLET | Freq: Every day | ORAL | Status: DC
  Administered 2021-08-15 – 2021-08-17 (×3): 500 mg via ORAL
  Filled 2021-08-15 (×3): qty 1

## 2021-08-15 MED ORDER — DOXERCALCIFEROL 4 MCG/2ML IV SOLN
4.0000 ug | INTRAVENOUS | Status: DC
  Administered 2021-08-15 – 2021-08-20 (×3): 4 ug via INTRAVENOUS
  Filled 2021-08-15 (×4): qty 2

## 2021-08-15 NOTE — Progress Notes (Signed)
  [facility] Progress Note   Subjective:  Seen on HD - 1.5L net UF and tolerating. Denies CP or dyspnea, no abdominal pain at the moment.  Objective Vitals:   08/14/21 2013 08/14/21 2133 08/15/21 0521 08/15/21 0730  BP: (!) 102/36 (!) 109/46 (!) 113/48 (!) (P) 153/69  Pulse: (!) 55 92 96   Resp:  20 18   Temp: 98 F (36.7 C) 97.6 F (36.4 C) 98.2 F (36.8 C) (P) 97.7 F (36.5 C)  TempSrc: Oral Oral Oral (P) Oral  SpO2: (!) 85% 94% 96%   Weight:       Physical Exam General: Frail woman, NAD Heart: RRR; no murmur Lungs: CTA anteriorly Abdomen: soft Extremities: No LE edema Dialysis Access: R AVG + bruit  Additional Objective Labs: Basic Metabolic Panel: Recent Labs  Lab 08/09/21 0204 08/10/21 0042 08/13/21 0031 08/14/21 0352 08/15/21 0100  NA 133*   < > 131* 133* 131*  K 3.9   < > 4.4 2.9* 3.6  CL 94*   < > 92* 95* 97*  CO2 26   < > 20* 24 21*  GLUCOSE 58*   < > 103* 115* 105*  BUN 27*   < > 30* 11 17  CREATININE 4.91*   < > 5.65* 3.34* 4.59*  CALCIUM 7.4*   < > 8.1* 8.3* 7.9*  PHOS 2.8  --  4.5  --  2.2*   < > = values in this interval not displayed.   Liver Function Tests: Recent Labs  Lab 08/09/21 0204 08/15/21 0100  ALBUMIN 2.4* 2.3*   CBC: Recent Labs  Lab 08/11/21 0359 08/12/21 0111 08/13/21 0031 08/14/21 0352 08/15/21 0100  WBC 8.2 9.3 11.4* 9.2 8.6  HGB 10.2* 10.8* 10.5* 10.6* 10.4*  HCT 30.2* 32.8* 32.6* 31.3* 31.0*  MCV 89.1 92.4 91.6 88.9 89.6  PLT 249 245 292 283 273   Medications:  piperacillin-tazobactam (ZOSYN)  IV 2.25 g (08/15/21 0555)    Chlorhexidine Gluconate Cloth  6 each Topical Q0600   cinacalcet  30 mg Oral Q breakfast   feeding supplement  237 mL Oral BID BM   feeding supplement (NEPRO CARB STEADY)  237 mL Oral BID BM   heparin  5,000 Units Subcutaneous Q8H   pantoprazole  40 mg Oral Daily   sodium chloride flush  3 mL Intravenous Q12H   zolpidem  5 mg Oral QHS    Dialysis Orders: MWF at [facility] 4  hrs 350/600, EDW 49.5kg, 2K/2Ca, RUE AVG, no heparin - No ESA  ,hgb 11.2, 11.8/Mircera 60 7/18 - Sensipar 30 mg PO TIW - Hectorol 4 mcg IV TIW  Assessment/Plan: Sigmoid diverticulitis with microperforation- on IV Zosyn and CCS following.   ESRD: Continue HD per usual MWF schedule - HD today. Hypertension/volume: 1.5L UFG today, BP controlled. Anemia of ESRD: Hgb 10.4 - stable without ESA for now.  Metabolic bone disease: CorrCa ok, Phos low.  Binders on hold, restart Hectoral and adjust sensipar to MWF dosing. Hx CVA-on statin and aspirin Hx anxiety/depression-per admit on Xanax and BuSpar  History of CAD     Veneta Penton, PA-C 08/15/2021, 8:45 AM  [employer]

## 2021-08-15 NOTE — Progress Notes (Signed)
PHARMACY NOTE:  ANTIMICROBIAL RENAL DOSAGE ADJUSTMENT  Current antimicrobial regimen includes a mismatch between antimicrobial dosage and estimated renal function.  As per policy approved by the Pharmacy & Therapeutics and Medical Executive Committees, the antimicrobial dosage will be adjusted accordingly.  Current antimicrobial dosage:  amoxicillin/clavulanate 875-125mg  Q 12 hr  Indication: sigmoid diverticulitis with microperforation  Renal Function:  Estimated Creatinine Clearance: 9 mL/min (A) (by C-G formula based on SCr of 4.59 mg/dL (H)). [x]      On intermittent HD, scheduled: MWF []      On CRRT    Antimicrobial dosage has been changed to:  500-125mg  q24hr   Thank you for allowing pharmacy to be a part of this patient's care.  Benetta Spar, PharmD, BCPS, BCCP Clinical Pharmacist  Please check AMION for all [place] phone numbers After 10:00 PM, call [place] [phone]

## 2021-08-15 NOTE — Progress Notes (Signed)
PROGRESS NOTE   [name]  VZD:[id number] DOB: [birth year] DOA: 08/06/2021 PCP: [name], MD  Brief Narrative:  70 year old black female community dwelling 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 1.8 4.9X 2.4 cm lateral pelvis chronic hematoma  Renal consulted and continues to follow  General surgery consulted, recommended conservative management-labs normalized started on sips of clears  Hospital-Problem based course  Acute sigmoid diverticulitis + microperforation Zosyn-->Augmentin 9/2, advancing diet to regular as WBC remains low and no concern for intra-ab abscess Pain control Dilaudid 1 mg every 4 as needed She is poorly mobile and refusing to get OOB--with her prolonged immobility will ask PT to see ESRD MWF/MGUS/renal transplant 1997 Left renal artery aneurysm Continue Sensipar 30 every morning hold renal vit until taking full diet Defer to renal dialysis management etc. Continue Auryxia when taking p.o. On admit was volume overloaded which is now completely resolved Paroxysmal A. fib prior VT DCCV 04/02/2020--not on anticoagulation secondary to bleeding Seemed rate controlled without intervention Outpatient monitoring Severe hypokalemia 4 runs K given 9/1, labs have improved Anxiety Continue Xanax 0.25 daily as needed anxiety 5 x 3 cm lateral pelvic chronic hematoma Coincidently found--suppose this can be imaged in the outpatient setting not emergently   DVT prophylaxis: Subcu heparin Code Status: Full Family Communication: None present currently at the  bedside Disposition:  Status is: Inpatient  Remains inpatient appropriate because:Hemodynamically unstable and Unsafe d/c plan  Dispo: The patient is from: Home              Anticipated d/c is to:  Possible home with home health will need to be ambulated              Patient currently is not medically stable to d/c.   Difficult to place patient No       Consultants:  General surgery Renal  Procedures: n  Antimicrobials: Zosyn-->Augmentin   Subjective:  Seen on HD unit hungry --has not been OOB per RN report on floor and declines mobility "i'll get up"  Objective: Vitals:   08/15/21 0930 08/15/21 1000 08/15/21 1030 08/15/21 1100  BP: (!) 178/71 (!) 153/63 (!) (P) 143/68 (!) (P) 143/61  Pulse: 94 95 (P) 94 (P) 85  Resp: 15 17 (P) 18 (P) 18  Temp:      TempSrc:      SpO2:      Weight:        Intake/Output Summary (Last 24 hours) at 08/15/2021 1120 Last data filed at 08/14/2021 1300 Gross per 24 hour  Intake 360 ml  Output --  Net 360 ml    Filed Weights   08/13/21 0820 08/13/21 1130 08/15/21 0730  Weight: 52.8 kg 52.8 kg (P) 49.9 kg    Examination:  no distress  EOMI NCAT arcus senilis + no icterus no pallor Neck soft supple S1-S2 no murmur no rub no gallop Abdomen soft no rebound  slight distension Neurologically intact moving 4 limbs equally without deficit   Data Reviewed: personally reviewed   CBC    Component Value Date/Time   WBC 8.6 08/15/2021 0100   RBC 3.46 (L) 08/15/2021 0100   HGB 10.4 (L)  08/15/2021 0100   HGB 10.6 (L) 07/13/2018 0814   HGB 8.7 (L) 09/24/2011 1038   HCT 31.0 (L) 08/15/2021 0100   HCT 33.0 (L) 07/13/2018 0814   HCT 27.3 (L) 09/24/2011 1038   PLT 273 08/15/2021 0100   PLT 203 07/13/2018 0814   MCV 89.6 08/15/2021 0100   MCV 91 07/13/2018 0814   MCV 94.5 09/24/2011 1038   MCH 30.1 08/15/2021 0100   MCHC 33.5 08/15/2021 0100   RDW 15.8 (H) 08/15/2021 0100   RDW 16.9 (H) 07/13/2018 0814   RDW 20.1 (H) 09/24/2011  1038   LYMPHSABS 1.3 08/06/2021 0951   LYMPHSABS 1.1 09/24/2011 1038   MONOABS 0.5 08/06/2021 0951   MONOABS 0.6 09/24/2011 1038   EOSABS 0.2 08/06/2021 0951   EOSABS 0.1 09/24/2011 1038   BASOSABS 0.0 08/06/2021 0951   BASOSABS 0.0 09/24/2011 1038   CMP Latest Ref Rng & Units 08/15/2021 08/14/2021 08/13/2021  Glucose 70 - 99 mg/dL 105(H) 115(H) 103(H)  BUN 8 - 23 mg/dL 17 11 30(H)  Creatinine 0.44 - 1.00 mg/dL 4.59(H) 3.34(H) 5.65(H)  Sodium 135 - 145 mmol/L 131(L) 133(L) 131(L)  Potassium 3.5 - 5.1 mmol/L 3.6 2.9(L) 4.4  Chloride 98 - 111 mmol/L 97(L) 95(L) 92(L)  CO2 22 - 32 mmol/L 21(L) 24 20(L)  Calcium 8.9 - 10.3 mg/dL 7.9(L) 8.3(L) 8.1(L)  Total Protein 6.5 - 8.1 g/dL - - -  Total Bilirubin 0.3 - 1.2 mg/dL - - -  Alkaline Phos 38 - 126 U/L - - -  AST 15 - 41 U/L - - -  ALT 0 - 44 U/L - - -     Radiology Studies: No results found.   Scheduled Meds:  amoxicillin-clavulanate  1 tablet Oral Daily   Chlorhexidine Gluconate Cloth  6 each Topical Q0600   [START ON 08/18/2021] cinacalcet  30 mg Oral Once per day on Mon Wed Fri   doxercalciferol  4 mcg Intravenous Q M,W,F-HD   feeding supplement  237 mL Oral BID BM   feeding supplement (NEPRO CARB STEADY)  237 mL Oral BID BM   heparin  5,000 Units Subcutaneous Q8H   pantoprazole  40 mg Oral Daily   sodium chloride flush  3 mL Intravenous Q12H   zolpidem  5 mg Oral QHS   Continuous Infusions:     LOS: 9 days   Time spent: [place], MD [facility] To contact the attending provider between 7A-7P or the covering provider during after hours 7P-7A, please log into the web site [email or web address] and access using universal [place] password for that web site. If you do not have the password, please call the hospital operator.  08/15/2021, 11:20 AM

## 2021-08-15 NOTE — Progress Notes (Signed)
Physical Therapy Evaluation Patient Details Name: [name] MRN: [id number] DOB: [birth year] Today's Date: 08/15/2021   History of Present Illness  Pt is a 70yo female presenting to [facility] ED on 8/24 with complaints of abdominal pain; CT found acute sigmoid diverticulitis with colon perforation. PMH: ESRD on HD MWF, postrenal transplant 1996, afib, CAD, CHF, CVA with LUE weakness.   Clinical Impression  Pt presents with the impairments above and problems below. Required min assist for bed mobility, max assist for transfers. Pt very weak and with very flexed posture. Increased difficulty taking steps. Pt reports she was Mod I with use of rollator prior to admission. Recommending SNF-level therapies upon discharge to address current limitations. We will continue to work with her acutely to promote independence with functional mobility.     Follow Up Recommendations SNF    Equipment Recommendations  Rolling walker with 5" wheels;3in1 (PT);Wheelchair (measurements PT);Wheelchair cushion (measurements PT)    Recommendations for Other Services       Precautions / Restrictions Precautions Precautions: Fall Precaution Comments: Pt very weak. Restrictions Weight Bearing Restrictions: No      Mobility  Bed Mobility Overal bed mobility: Needs Assistance Bed Mobility: Supine to Sit     Supine to sit: Min assist     General bed mobility comments: Pt required min assist for trunk control and to scoot hips EOB to place feet on floor.    Transfers Overall transfer level: Needs assistance Equipment used: None Transfers: Sit to/from [facility] Sit to Stand: Max assist Stand pivot transfers: Max assist       General transfer comment: Pt completed sit to stand transfer x3 during brief change and cleanup. Required max assist for lift assist to acheive standing; bilateral knee blocking to prevent buckling. Very flexed posture and difficulty taking steps to chair. Pt  requested seated rest break following each stand. Max verbal cuing for hand placement.  Ambulation/Gait             General Gait Details: deferred  Stairs            Wheelchair Mobility    Modified Rankin (Stroke Patients Only)       Balance Overall balance assessment: Needs assistance Sitting-balance support: Feet supported;Single extremity supported Sitting balance-Leahy Scale: Poor     Standing balance support: Bilateral upper extremity supported Standing balance-Leahy Scale: Zero Standing balance comment: max A to perform standing                             Pertinent Vitals/Pain Pain Assessment: No/denies pain    Home Living Family/patient expects to be discharged to:: Private residence Living Arrangements: Parent (Mother) Available Help at Discharge: Family;Available PRN/intermittently (Sister) Type of Home: House Home Access: Stairs to enter Entrance Stairs-Rails: Left Entrance Stairs-Number of Steps: 3 Home Layout: One level Home Equipment: Shower seat;Cane - single point;Walker - 4 wheels Additional Comments: Pt's mother has dementia, sister cares for mother and pt reports sister sometimes cares for her as well.    Prior Function Level of Independence: Independent with assistive device(s)         Comments: Pt reports she uses rollator for mobility; independent w/ADLs.     Hand Dominance   Dominant Hand: Right    Extremity/Trunk Assessment   Upper Extremity Assessment Upper Extremity Assessment: Generalized weakness    Lower Extremity Assessment Lower Extremity Assessment: Generalized weakness    Cervical / Trunk Assessment Cervical /  Trunk Assessment: Kyphotic  Communication   Communication: No difficulties  Cognition Arousal/Alertness: Awake/alert Behavior During Therapy: WFL for tasks assessed/performed Overall Cognitive Status: No family/caregiver present to determine baseline cognitive functioning                                  General Comments: Pt orginally resistive to therapy but once agreeable to therapy reported that she "had an accident" so breif was changed secondary to [facility]; RN notified.      General Comments      Exercises     Assessment/Plan    PT Assessment Patient needs continued PT services  PT Problem List Decreased strength;Decreased range of motion;Decreased activity tolerance;Decreased balance;Decreased mobility;Decreased cognition;Decreased safety awareness;Decreased knowledge of precautions       PT Treatment Interventions DME instruction;Gait training;Stair training;Functional mobility training;Therapeutic activities;Therapeutic exercise;Balance training;Patient/family education    PT Goals (Current goals can be found in the Care Plan section)  Acute Rehab PT Goals Patient Stated Goal: To get stronger PT Goal Formulation: With patient Time For Goal Achievement: 08/29/21 Potential to Achieve Goals: Fair    Frequency Min 2X/week   Barriers to discharge Decreased caregiver support;Inaccessible home environment      Co-evaluation               AM-PAC PT "6 Clicks" Mobility  Outcome Measure Help needed turning from your back to your side while in a flat bed without using bedrails?: A Little Help needed moving from lying on your back to sitting on the side of a flat bed without using bedrails?: A Little Help needed moving to and from a bed to a chair (including a wheelchair)?: A Lot Help needed standing up from a chair using your arms (e.g., wheelchair or bedside chair)?: A Lot Help needed to walk in hospital room?: Total Help needed climbing 3-5 steps with a railing? : Total 6 Click Score: 12    End of Session Equipment Utilized During Treatment: Gait belt;Oxygen (1.5L O2 via [place]) Activity Tolerance: Patient limited by fatigue Patient left: in chair;with call bell/phone within reach;with chair alarm set Nurse Communication: Mobility  status;Need for lift equipment Charlaine Dalton) PT Visit Diagnosis: Muscle weakness (generalized) (M62.81);Unsteadiness on feet (R26.81);Other abnormalities of gait and mobility (R26.89)    Time: 9201-0071 PT Time Calculation (min) (ACUTE ONLY): 26 min   Charges:   PT Evaluation $PT Eval Moderate Complexity: 1 Mod PT Treatments $Therapeutic Activity: 8-22 mins        Dawayne Cirri, SPT Dawayne Cirri 08/15/2021, 4:29 PM

## 2021-08-15 NOTE — Progress Notes (Signed)
Subjective: CC: Seen in HD. Patient reports no abdominal pain, n/v. Tolerating fld. BM yesterday.   Objective: Vital signs in last 24 hours: Temp:  [97.6 F (36.4 C)-98.2 F (36.8 C)] (P) 97.7 F (36.5 C) (09/02 0730) Pulse Rate:  [55-103] 96 (09/02 0521) Resp:  [18-20] 18 (09/02 0521) BP: (102-114)/(36-72) (P) 153/69 (09/02 0730) SpO2:  [85 %-100 %] 96 % (09/02 0521) Last BM Date: 08/14/21  Intake/Output from previous day: 09/01 0701 - 09/02 0700 In: 600 [P.O.:600] Out: -  Intake/Output this shift: No intake/output data recorded.  PE: Gen:  Alert, NAD, pleasant Abd: Soft, ND, NT +BS Psych: A&Ox3  Skin: no rashes noted, warm and dry  Lab Results:  Recent Labs    08/14/21 0352 08/15/21 0100  WBC 9.2 8.6  HGB 10.6* 10.4*  HCT 31.3* 31.0*  PLT 283 273   BMET Recent Labs    08/14/21 0352 08/15/21 0100  NA 133* 131*  K 2.9* 3.6  CL 95* 97*  CO2 24 21*  GLUCOSE 115* 105*  BUN 11 17  CREATININE 3.34* 4.59*  CALCIUM 8.3* 7.9*   PT/INR No results for input(s): LABPROT, INR in the last 72 hours. CMP     Component Value Date/Time   NA 131 (L) 08/15/2021 0100   NA 140 07/13/2018 0814   K 3.6 08/15/2021 0100   CL 97 (L) 08/15/2021 0100   CO2 21 (L) 08/15/2021 0100   GLUCOSE 105 (H) 08/15/2021 0100   BUN 17 08/15/2021 0100   BUN 45 (H) 07/13/2018 0814   CREATININE 4.59 (H) 08/15/2021 0100   CALCIUM 7.9 (L) 08/15/2021 0100   CALCIUM 9.6 08/03/2011 1005   PROT 6.9 08/06/2021 0951   PROT 6.9 07/13/2018 0814   ALBUMIN 2.3 (L) 08/15/2021 0100   ALBUMIN 4.1 07/13/2018 0814   AST 20 08/06/2021 0951   ALT 11 08/06/2021 0951   ALKPHOS 149 (H) 08/06/2021 0951   BILITOT 1.1 08/06/2021 0951   BILITOT 0.4 07/13/2018 0814   GFRNONAA 10 (L) 08/15/2021 0100   GFRAA 7 (L) 07/26/2020 0920   Lipase     Component Value Date/Time   LIPASE 40 08/06/2021 0951    Studies/Results: No results found.  Anti-infectives: Anti-infectives (From admission,  onward)    Start     Dose/Rate Route Frequency Ordered Stop   08/06/21 2300  piperacillin-tazobactam (ZOSYN) IVPB 2.25 g        2.25 g 100 mL/hr over 30 Minutes Intravenous Every 8 hours 08/06/21 1611     08/06/21 2200  piperacillin-tazobactam (ZOSYN) IVPB 3.375 g  Status:  Discontinued        3.375 g 100 mL/hr over 30 Minutes Intravenous Every 8 hours 08/06/21 1606 08/06/21 1611   08/06/21 1530  piperacillin-tazobactam (ZOSYN) IVPB 3.375 g        3.375 g 100 mL/hr over 30 Minutes Intravenous  Once 08/06/21 1516 08/06/21 1632        Assessment/Plan Sigmoid diverticulitis with microperforation - Repeat CT 8/28 w/ SB thickening likely reactive 2/2 continued inflammation of diverticulitis w/ microperf  - WBC down/wnl. Tolerating fld w/o n/v. Having bowel function. NT on exam. Adv diet.  - Repeat labs in AM - Hopefully home in am if tolerates regular diet and continues to improve with conservative therapies.    FEN - Renal diet + shakes  VTE - heparin subq  ID - Zosyn   ESRD on HD Hx of CVA Hx of HTN Hx of HLD  Hx of A. Fib   LOS: 9 days    Jillyn Ledger , [facility] 08/15/2021, 8:48 AM Please see Amion for pager number during day hours 7:00am-4:30pm

## 2021-08-16 DIAGNOSIS — E785 Hyperlipidemia, unspecified: Secondary | ICD-10-CM

## 2021-08-16 DIAGNOSIS — E43 Unspecified severe protein-calorie malnutrition: Secondary | ICD-10-CM

## 2021-08-16 NOTE — Progress Notes (Signed)
   08/15/21 2102  Assess: MEWS Score  Temp 98.5 F (36.9 C)  BP 96/72  Pulse Rate (!) 101  SpO2 97 %  Assess: MEWS Score  MEWS Temp 0  MEWS Systolic 1  MEWS Pulse 1  MEWS RR 0  MEWS LOC 0  MEWS Score 2  MEWS Score Color Yellow  Assess: if the MEWS score is Yellow or Red  Were vital signs taken at a resting state? Yes  Focused Assessment No change from prior assessment  Early Detection of Sepsis Score *See Row Information* Low  MEWS guidelines implemented *See Row Information* No, vital signs rechecked  Treat  MEWS Interventions Other (Comment) (full set of VS  rechecked)  Pain Scale 0-10  Pain Score 0  Patients response to intervention Effective  Take Vital Signs  Increase Vital Sign Frequency   (VSS. MEWS green.)  Notify: Charge Nurse/RN  Name of Charge Nurse/RN Notified Josephine, RN  Date Charge Nurse/RN Notified 08/15/21  Time Charge Nurse/RN Notified 2120   Patient reassessed. VSS. No distress noted. Denies pain or discomfort. Repositioned and assisted with ADLs as needed. Will continue to monitor.

## 2021-08-16 NOTE — Progress Notes (Signed)
PROGRESS NOTE    GALE ANDREASSEN  ION:[id number] DOB: 10/30/1951 DOA: 08/06/2021 PCP: Renford Dills, MD    Brief Narrative:  Mrs. Harnois was admitted to the hospital with the working diagnosis of acute sigmoid diverticulitis with microperforation.    70 year old female past medical history for end-stage renal disease on hemodialysis, coronary artery disease, diastolic heart failure and CVA who presented with abdominal pain.  Severe sharp mid epigastric abdominal pain, associated with nausea and intermittent diarrhea.  On her initial physical examination blood pressure 132/60, heart rate 93, respiratory 22, oxygen saturation 96%, her lungs are clear to auscultation bilaterally, heart S1-S2, present, rhythmic, her abdomen was tender to palpation in the lower quadrants, no rebound or guarding, no lower extremity edema.   Sodium 138, potassium 4.3, chloride 94, bicarb 27, glucose 90, BUN 36, creatinine 6.23, white count 4.9, hemoglobin 12.5, hematocrit 40.0, platelets 212. SARS COVID-19 negative.   CT of the abdomen showed acute sigmoid diverticulitis with a few adjacent locules of extraluminal air compatible with microperforation.  No organized fluid collection or abscess.  Thickened appearing loops of large and small bowel, consistent with reactive enterocolitis.  Small volume ascites.  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   EKG 100 bpm, normal axis, normal intervals, sinus rhythm, poor R wave progression, no significant ST segment or T wave changes.   Patient was placed on supportive medical therapy including intravenous fluids and IV antibiotic therapy.  As needed analgesics and antiemetics.   Patient slowly improving, her diet has been advanced.   Because worsening leukocytosis abdominal pelvis CT was ordered. Resulted in free air in the peritoneum indicating  bowel perforation.    Clinically slowly improving, plan to continue supportive care including IV antibiotic therapy.    Patient very weak and deconditioned, pending transfer to SNF.   Assessment & Plan:   Principal Problem:   Diverticulitis of colon with perforation Active Problems:   End-stage renal disease on hemodialysis (HCC)   Hyperlipidemia   Atrial fibrillation (HCC)   Gastro-esophageal reflux disease without esophagitis   Acute respiratory failure with hypoxia (HCC)   Protein-calorie malnutrition, severe   Acute sigmoid diverticulitis complicated with microperforation (no sepsis).  Her abdominal pain continue to improve but not yet resolved, patient continue to be very weak and deconditioned No leukocytosis and she has remained afebrile.    Antibiotic therapy with oral Augmentin On hydromorphone for pain control.  Continue with pantoprazole. Tolerating well regular diet.  Patient will need SNF to continue her recovery.    2. Hypoglycemia/ severe protein calorie protein malnutrition.  Patient tolerating po well, her glucose fasting 105 with no further hypoglycemic episodes.    3. ESRD on HD M-W-F, hyponatremia no signs of volume overload. (On admission had volume overload and acute hypoxemic respiratory failure, now resolved).  Anemia of chronic renal disease, stable hgb and hct Left renal artery aneurysm, stable. Follow as outpatient.   Continue to follow up with nephrology for inpatient renal replacement therapy Sensipar for metabolic bone disease.    4. Atrial fibrillation paroxysmal  Stable and rate controlled, not on anticoagulation for now due to peritonitis.  Now off telemetry   5. Dyslipidemia On storvastatin    6. GERD. continue with pantoprazole.    7. Hx of CVA / HTN On aspirin and atorvastatin.    8. Depression/ anxiety. On buspirone and alprazolam    Status is: Inpatient  Remains inpatient appropriate  because:Inpatient level of care  appropriate due to severity of illness  Dispo: The patient is from: Home              Anticipated d/c is to: SNF              Patient currently is not medically stable to d/c.   Difficult to place patient No   DVT prophylaxis: Scd   Code Status:    full  Family Communication:   No family at the bedside      Nutrition Status: Nutrition Problem: Severe Malnutrition Etiology: chronic illness Signs/Symptoms: severe fat depletion, severe muscle depletion, energy intake < 75% for > or equal to 1 month Interventions: MVI, Refer to RD note for recommendations     Consultants:  Surgery  Nephrology     Antimicrobials:  Augmentin    Subjective: Patient with improving abdominal pain, no nausea or vomiting and tolerating regular diet, no dyspnea or chest pain. Continue to be very weak and deconditioned   Objective: Vitals:   08/15/21 1546 08/15/21 2102 08/15/21 2120 08/16/21 0316  BP: 113/60 96/72 (!) 128/49 (!) 115/48  Pulse: (!) 102 (!) 101 98 100  Resp: 17  18 18   Temp: 98.3 F (36.8 C) 98.5 F (36.9 C) 98.5 F (36.9 C) 98.9 F (37.2 C)  TempSrc: Oral Oral Oral Oral  SpO2:  97% 99% 100%  Weight:        Intake/Output Summary (Last 24 hours) at 08/16/2021 1339 Last data filed at 08/15/2021 2300 Gross per 24 hour  Intake 240 ml  Output 0 ml  Net 240 ml   Filed Weights   08/13/21 1130 08/15/21 0730 08/15/21 1115  Weight: 52.8 kg 49.9 kg 47.9 kg    Examination:   General: Not in pain or dyspnea, deconditioned  Neurology: Awake and alert, non focal  E ENT: no pallor, no icterus, oral mucosa moist Cardiovascular: No JVD. S1-S2 present, rhythmic, no gallops, rubs, or murmurs. No lower extremity edema. Pulmonary: positive breath sounds bilaterally, adequate air movement, no wheezing, rhonchi or rales. Gastrointestinal. Abdomen mild distended but not tender Skin. No rashes Musculoskeletal: no joint deformities     Data Reviewed: I have personally reviewed  following labs and imaging studies  CBC: Recent Labs  Lab 08/11/21 0359 08/12/21 0111 08/13/21 0031 08/14/21 0352 08/15/21 0100  WBC 8.2 9.3 11.4* 9.2 8.6  HGB 10.2* 10.8* 10.5* 10.6* 10.4*  HCT 30.2* 32.8* 32.6* 31.3* 31.0*  MCV 89.1 92.4 91.6 88.9 89.6  PLT 249 245 292 283 273   Basic Metabolic Panel: Recent Labs  Lab 08/11/21 0359 08/12/21 0111 08/13/21 0031 08/14/21 0352 08/15/21 0100  NA 127* 133* 131* 133* 131*  K 3.7 4.1 4.4 2.9* 3.6  CL 88* 95* 92* 95* 97*  CO2 22 21* 20* 24 21*  GLUCOSE 76 81 103* 115* 105*  BUN 41* 20 30* 11 17  CREATININE 6.86* 4.40* 5.65* 3.34* 4.59*  CALCIUM 7.3* 7.9* 8.1* 8.3* 7.9*  MG  --   --  2.3  --   --   PHOS  --   --  4.5  --  2.2*   GFR: Estimated Creatinine Clearance: 8.6 mL/min (A) (by C-G formula based on SCr of 4.59 mg/dL (H)). Liver Function Tests: Recent Labs  Lab 08/15/21 0100  ALBUMIN 2.3*   No results for input(s): LIPASE, AMYLASE in the last 168 hours. No results for input(s): AMMONIA in the last 168 hours. Coagulation Profile: No results for input(s): INR,  PROTIME in the last 168 hours. Cardiac Enzymes: No results for input(s): CKTOTAL, CKMB, CKMBINDEX, TROPONINI in the last 168 hours. BNP (last 3 results) No results for input(s): PROBNP in the last 8760 hours. HbA1C: No results for input(s): HGBA1C in the last 72 hours. CBG: Recent Labs  Lab 08/09/21 1547  GLUCAP 140*   Lipid Profile: No results for input(s): CHOL, HDL, LDLCALC, TRIG, CHOLHDL, LDLDIRECT in the last 72 hours. Thyroid Function Tests: No results for input(s): TSH, T4TOTAL, FREET4, T3FREE, THYROIDAB in the last 72 hours. Anemia Panel: No results for input(s): VITAMINB12, FOLATE, FERRITIN, TIBC, IRON, RETICCTPCT in the last 72 hours.    Radiology Studies: I have reviewed all of the imaging during this hospital visit personally     Scheduled Meds:  amoxicillin-clavulanate  1 tablet Oral Daily   Chlorhexidine Gluconate Cloth  6  each Topical Q0600   [START ON 08/18/2021] cinacalcet  30 mg Oral Once per day on Mon Wed Fri   doxercalciferol  4 mcg Intravenous Q M,W,F-HD   feeding supplement (NEPRO CARB STEADY)  237 mL Oral BID BM   heparin  5,000 Units Subcutaneous Q8H   pantoprazole  40 mg Oral Daily   sodium chloride flush  3 mL Intravenous Q12H   zolpidem  5 mg Oral QHS   Continuous Infusions:   LOS: 10 days        Mikaella Escalona Annett Gula, MD

## 2021-08-16 NOTE — Progress Notes (Addendum)
   08/15/21 2120  Assess: MEWS Score  Temp 98.5 F (36.9 C)  BP (!) 128/49  Pulse Rate 98  Resp 18  Level of Consciousness Alert  SpO2 99 %  O2 Device Room Air  Assess: MEWS Score  MEWS Temp 0  MEWS Systolic 0  MEWS Pulse 0  MEWS RR 0  MEWS LOC 0  MEWS Score 0  MEWS Score Color Green  Treat  Pain Scale 0-10  Pain Score 0

## 2021-08-16 NOTE — Progress Notes (Signed)
Patient ID: [name], female   DOB: August 18, 1951, 70 y.o.   MRN: [id number] [facility] Surgery Progress Note:   * No surgery found *  Subjective: Mental status is fairly clear about circumstances.  Complaints new pain in the LLQ after she coughed this morning. Objective: Vital signs in last 24 hours: Temp:  [97.5 F (36.4 C)-98.9 F (37.2 C)] 98.9 F (37.2 C) (09/03 0316) Pulse Rate:  [85-102] 100 (09/03 0316) Resp:  [15-18] 18 (09/03 0316) BP: (96-178)/(48-72) 115/48 (09/03 0316) SpO2:  [97 %-100 %] 100 % (09/03 0316) Weight:  [47.9 kg] 47.9 kg (09/02 1115)  Intake/Output from previous day: 09/02 0701 - 09/03 0700 In: 240 [P.O.:240] Out: 1500  Intake/Output this shift: No intake/output data recorded.  Physical Exam: Work of breathing is not labored.  Pain in LLQ but no guarding.    Lab Results:  Results for orders placed or performed during the hospital encounter of 08/06/21 (from the past 48 hour(s))  CBC     Status: Abnormal   Collection Time: 08/15/21  1:00 AM  Result Value Ref Range   WBC 8.6 4.0 - 10.5 K/uL   RBC 3.46 (L) 3.87 - 5.11 MIL/uL   Hemoglobin 10.4 (L) 12.0 - 15.0 g/dL   HCT 31.0 (L) 36.0 - 46.0 %   MCV 89.6 80.0 - 100.0 fL   MCH 30.1 26.0 - 34.0 pg   MCHC 33.5 30.0 - 36.0 g/dL   RDW 15.8 (H) 11.5 - 15.5 %   Platelets 273 150 - 400 K/uL   nRBC 0.0 0.0 - 0.2 %    Comment: Performed at [facility], [place] [address] [place], [place] [address]  Renal function panel     Status: Abnormal   Collection Time: 08/15/21  1:00 AM  Result Value Ref Range   Sodium 131 (L) 135 - 145 mmol/L   Potassium 3.6 3.5 - 5.1 mmol/L    Comment: DELTA CHECK NOTED   Chloride 97 (L) 98 - 111 mmol/L   CO2 21 (L) 22 - 32 mmol/L   Glucose, Bld 105 (H) 70 - 99 mg/dL    Comment: Glucose reference range applies only to samples taken after fasting for at least 8 hours.   BUN 17 8 - 23 mg/dL   Creatinine, Ser 4.59 (H) 0.44 - 1.00 mg/dL   Calcium 7.9 (L) 8.9 - 10.3  mg/dL   Phosphorus 2.2 (L) 2.5 - 4.6 mg/dL   Albumin 2.3 (L) 3.5 - 5.0 g/dL   GFR, Estimated 10 (L) >60 mL/min    Comment: (NOTE) Calculated using the CKD-EPI Creatinine Equation (2021)    Anion gap 13 5 - 15    Comment: Performed at [name] [address] [place], [place] [address]    Radiology/Results: No results found.  Anti-infectives: Anti-infectives (From admission, onward)    Start     Dose/Rate Route Frequency Ordered Stop   08/15/21 1015  amoxicillin-clavulanate (AUGMENTIN) 500-125 MG per tablet 500 mg        1 tablet Oral Daily 08/15/21 1008     08/15/21 1000  amoxicillin-clavulanate (AUGMENTIN) 875-125 MG per tablet 1 tablet  Status:  Discontinued        1 tablet Oral Every 12 hours 08/15/21 0947 08/15/21 1008   08/06/21 2300  piperacillin-tazobactam (ZOSYN) IVPB 2.25 g  Status:  Discontinued        2.25 g 100 mL/hr over 30 Minutes Intravenous Every 8 hours 08/06/21 1611 08/15/21 0947  08/06/21 2200  piperacillin-tazobactam (ZOSYN) IVPB 3.375 g  Status:  Discontinued        3.375 g 100 mL/hr over 30 Minutes Intravenous Every 8 hours 08/06/21 1606 08/06/21 1611   08/06/21 1530  piperacillin-tazobactam (ZOSYN) IVPB 3.375 g        3.375 g 100 mL/hr over 30 Minutes Intravenous  Once 08/06/21 1516 08/06/21 1632       Assessment/Plan: Problem List: Patient Active Problem List   Diagnosis Date Noted   Protein-calorie malnutrition, severe 08/12/2021   Diverticulitis of colon with perforation 08/06/2021   Acute respiratory failure with hypoxia ([place]) 08/06/2021   Major depressive disorder, recurrent episode, moderate ([place]) 02/23/2021   TIA (transient ischemic attack) 02/21/2021   Prolonged QT interval 02/21/2021   NSTEMI (non-ST elevated myocardial infarction) ([place]) 05/14/2020   Volume overload 05/13/2020   Hypotension after procedure 03/16/2020   Chest pain 03/16/2020   Change in mental state 03/15/2020   Ventricular tachycardia ([place]) 03/15/2020    Seizure ([place]) 03/15/2020   Unresponsive episode 03/15/2020   Pulmonary edema 20/25/4270   Complication of vascular access for dialysis 12/21/2019   Breakdown (mechanical) of surgically created arteriovenous fistula, initial encounter ([place]) 12/18/2019   Weakness 10/11/2019   Aneurysm artery, 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) 04/17/2019   Diarrhea, unspecified 04/17/2019   Encounter for screening for depression 04/17/2019   Headache, unspecified 04/17/2019   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   Pain, unspecified 04/17/2019   Polyp of colon 04/17/2019   Primary generalized (osteo)arthritis 04/17/2019   Pruritus, unspecified 04/17/2019   Pure hypercholesterolemia, unspecified 04/17/2019   Secondary hyperparathyroidism of renal origin ([place]) 04/17/2019   Gastro-esophageal reflux disease without esophagitis 04/17/2019   Essential (primary) hypertension 04/17/2019   Hyperlipidemia, unspecified 04/17/2019   Transient cerebral ischemic attack, unspecified 04/17/2019   Hypoxemia 12/13/2018   Atrial fibrillation ([place]) 03/23/2017   Prolonged Q-T interval on ECG 02/24/2017   Hypokalemia 02/24/2017   Acute ischemic stroke ([place]) 02/23/2017   Malnutrition of moderate degree 12/24/2016   Problem with dialysis access ([place]) 12/21/2016   Acute ischemic colitis ([place]) 05/16/2016   Colitis 05/15/2016   Rectal bleeding 05/15/2016   Neurologic abnormality 11/19/2015   Hyperkalemia 11/19/2015   Anxiety 11/19/2015   Insomnia 11/19/2015   Gait instability    Stroke-like symptom 11/16/2015   Vestibular neuritis 11/16/2015   Stroke (cerebrum) ([place]) 11/16/2015   Dizziness 05/09/2015   Ataxia 05/09/2015   H/O: CVA (cerebrovascular  accident) 05/09/2015   Left facial numbness 05/09/2015   Left leg numbness 05/09/2015   Hyperlipidemia    Acute on chronic diastolic heart failure ([place])    Shortness of breath 04/01/2013   HTN (hypertension) 04/01/2013   Cholecystitis, acute 11/07/2012   End-stage renal disease on hemodialysis ([place]) 11/07/2012   Anemia in chronic kidney disease 11/07/2012    Patient says that she hasn't spoken with social worker about placement.  Moreover, she has this new onset of pain.  If this persist then she may need reimaging.   * No surgery found *    LOS: 10 days   Matt B. Hassell Done, MD, [facility] [phone] to reach the surgeon on call.    08/16/2021 9:28 AM

## 2021-08-16 NOTE — Progress Notes (Signed)
  [facility] Progress Note   Subjective:  Seen in room - c/o severe abdominal pain after coughing this morning. No CP/dyspnea. Wants to speak to case manager - thinks will need SNF placement.  Objective Vitals:   08/15/21 1546 08/15/21 2102 08/15/21 2120 08/16/21 0316  BP: 113/60 96/72 (!) 128/49 (!) 115/48  Pulse: (!) 102 (!) 101 98 100  Resp: 17  18 18   Temp: 98.3 F (36.8 C) 98.5 F (36.9 C) 98.5 F (36.9 C) 98.9 F (37.2 C)  TempSrc: Oral Oral Oral Oral  SpO2:  97% 99% 100%  Weight:       Physical Exam General: Frail woman, NAD Heart: RRR; no murmur Lungs: CTA anteriorly Abdomen: soft Extremities: No LE edema Dialysis Access: R AVG + bruit  Additional Objective Labs: Basic Metabolic Panel: Recent Labs  Lab 08/13/21 0031 08/14/21 0352 08/15/21 0100  NA 131* 133* 131*  K 4.4 2.9* 3.6  CL 92* 95* 97*  CO2 20* 24 21*  GLUCOSE 103* 115* 105*  BUN 30* 11 17  CREATININE 5.65* 3.34* 4.59*  CALCIUM 8.1* 8.3* 7.9*  PHOS 4.5  --  2.2*   Liver Function Tests: Recent Labs  Lab 08/15/21 0100  ALBUMIN 2.3*   CBC: Recent Labs  Lab 08/11/21 0359 08/12/21 0111 08/13/21 0031 08/14/21 0352 08/15/21 0100  WBC 8.2 9.3 11.4* 9.2 8.6  HGB 10.2* 10.8* 10.5* 10.6* 10.4*  HCT 30.2* 32.8* 32.6* 31.3* 31.0*  MCV 89.1 92.4 91.6 88.9 89.6  PLT 249 245 292 283 273   Medications:   amoxicillin-clavulanate  1 tablet Oral Daily   Chlorhexidine Gluconate Cloth  6 each Topical Q0600   [START ON 08/18/2021] cinacalcet  30 mg Oral Once per day on Mon Wed Fri   doxercalciferol  4 mcg Intravenous Q M,W,F-HD   feeding supplement (NEPRO CARB STEADY)  237 mL Oral BID BM   heparin  5,000 Units Subcutaneous Q8H   pantoprazole  40 mg Oral Daily   sodium chloride flush  3 mL Intravenous Q12H   zolpidem  5 mg Oral QHS    Dialysis Orders: MWF at [facility] 4 hrs 350/600, EDW 49.5kg, 2K/2Ca, RUE AVG, no heparin - No ESA  ,hgb 11.2, 11.8/Mircera 60 7/18 - Sensipar 30 mg PO  TIW - Hectorol 4 mcg IV TIW   Assessment/Plan: Sigmoid diverticulitis with microperforation: Surgery following. Prev on IV Zosyn -> now PO Augmentin.  ESRD: Continue HD per usual MWF schedule - next 9/5. Hypertension/volume: No edema on exam, BP controlled. Anemia of ESRD: Hgb 10.4 - stable without ESA for now.  Metabolic bone disease: CorrCa ok, Phos low.  Binders on hold, restart Hectoral and adjust sensipar to MWF dosing. Hx CVA-on statin and aspirin Hx anxiety/depression-per admit on Xanax and BuSpar  History of CAD   Veneta Penton, PA-C 08/16/2021, 11:13 AM  [employer]

## 2021-08-17 LAB — SARS CORONAVIRUS 2 (TAT 6-24 HRS): SARS Coronavirus 2: NEGATIVE

## 2021-08-17 MED ORDER — FERRIC CITRATE 1 GM 210 MG(FE) PO TABS
420.0000 mg | ORAL_TABLET | Freq: Three times a day (TID) | ORAL | Status: DC
  Administered 2021-08-17 – 2021-08-20 (×7): 420 mg via ORAL
  Filled 2021-08-17 (×12): qty 2

## 2021-08-17 NOTE — Progress Notes (Signed)
  [facility] Progress Note   Subjective:  Seen in room - abdominal pain a little better. No CP or dyspnea, still pretty weak. SNF placement pending. Looks like cleared for discharge by surgery team. Tells me had some discolored vaginal discharge this morning - possibly yeast s/p antibiotics -> to discuss with hospitalist.  Objective Vitals:   08/16/21 1428 08/16/21 2054 08/17/21 0500 08/17/21 0900  BP: 98/65 (!) 98/40 (!) 111/42 100/70  Pulse: 98 74 99 100  Resp: 18 15 17    Temp: 97.6 F (36.4 C) 98.5 F (36.9 C) 98.9 F (37.2 C) 97.9 F (36.6 C)  TempSrc: Oral Oral Oral Oral  SpO2: 99% 100% 95% 96%  Weight:       Physical Exam General: Frail woman, NAD Heart: RRR; no murmur Lungs: CTA anteriorly Abdomen: soft Extremities: No LE edema Dialysis Access: R AVG + bruit  Additional Objective Labs: Basic Metabolic Panel: Recent Labs  Lab 08/13/21 0031 08/14/21 0352 08/15/21 0100  NA 131* 133* 131*  K 4.4 2.9* 3.6  CL 92* 95* 97*  CO2 20* 24 21*  GLUCOSE 103* 115* 105*  BUN 30* 11 17  CREATININE 5.65* 3.34* 4.59*  CALCIUM 8.1* 8.3* 7.9*  PHOS 4.5  --  2.2*   Liver Function Tests: Recent Labs  Lab 08/15/21 0100  ALBUMIN 2.3*   CBC: Recent Labs  Lab 08/11/21 0359 08/12/21 0111 08/13/21 0031 08/14/21 0352 08/15/21 0100  WBC 8.2 9.3 11.4* 9.2 8.6  HGB 10.2* 10.8* 10.5* 10.6* 10.4*  HCT 30.2* 32.8* 32.6* 31.3* 31.0*  MCV 89.1 92.4 91.6 88.9 89.6  PLT 249 245 292 283 273   Medications:   amoxicillin-clavulanate  1 tablet Oral Daily   Chlorhexidine Gluconate Cloth  6 each Topical Q0600   [START ON 08/18/2021] cinacalcet  30 mg Oral Once per day on Mon Wed Fri   doxercalciferol  4 mcg Intravenous Q M,W,F-HD   feeding supplement (NEPRO CARB STEADY)  237 mL Oral BID BM   heparin  5,000 Units Subcutaneous Q8H   pantoprazole  40 mg Oral Daily   sodium chloride flush  3 mL Intravenous Q12H   zolpidem  5 mg Oral QHS    Dialysis Orders: MWF at  [facility] 4 hrs 350/600, EDW 49.5kg, 2K/2Ca, RUE AVG, no heparin - No ESA, Hgb > 11 as OP - Sensipar 30 mg PO TIW - Hectorol 4 mcg IV TIW   Assessment/Plan: Sigmoid diverticulitis with microperforation: Surgery following. Prev on IV Zosyn -> now finishing course of PO Augmentin.  ESRD: Continue HD per usual MWF schedule - next 9/5. Hypertension/volume: No edema on exam, BP controlled. Anemia of ESRD: Hgb 10.4 - stable without ESA for now.  Metabolic bone disease: CorrCa ok, Phos low.  Binders on hold, restarted Hectoral and adjusted sensipar to MWF dosing. Hx CVA-on statin and aspirin Hx anxiety/depression-per admit on Xanax and BuSpar  History of CAD  Dispo: Possibly SNF    Veneta Penton, PA-C 08/17/2021, 11:28 AM  [facility]

## 2021-08-17 NOTE — Progress Notes (Signed)
PROGRESS NOTE   [name]  EGB:[id number] DOB: 22-Jan-1951 DOA: 08/06/2021 PCP: [name], MD  Brief Narrative:  70 year old black female community dwelling 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 1.8 4.9X 2.4 cm lateral pelvis chronic hematoma  Renal consulted and continues to follow  General surgery consulted, recommended conservative management-labs normalized started on sips of clears  General surgery signed off antibiotics were narrowed She is currently awaiting skilled placement  Hospital-Problem based course  Acute sigmoid diverticulitis + microperforation Zosyn-->Augmentin 9/2 and discontinued antibiotics on 9/4 continue regular diet, no concern for abscess surgery has signed off Tylenol unknown for pain Will need skilled facility placement ordering COVID ESRD MWF/MGUS/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 fib prior VT DCCV 04/02/2020--not on anticoagulation secondary to bleeding Seemed rate controlled without intervention Outpatient monitoring Severe hypokalemia Periodic labs in the next several days Anxiety Continue Xanax 0.25 daily as needed anxiety 5 x 3 cm lateral pelvic chronic hematoma Coincidently found--suppose this can be imaged in the outpatient setting not emergently   DVT prophylaxis: Subcu heparin Code Status: Full Family Communication: discussed with son ivah girardot, [phone] Disposition:  Status is:  Inpatient  Remains inpatient appropriate because:Hemodynamically unstable and Unsafe d/c plan  Dispo: The patient is from: Home              Anticipated d/c is to: SNF              Patient currently is not medically stable to d/c.   Difficult to place patient No    Consultants:  General surgery Renal  Procedures: n  Antimicrobials: Zosyn-->Augmentin   Subjective:  Working with occupational therapy on mobility and feeding No chest pain no fever Some reports of vag thrush  Objective: Vitals:   08/16/21 1428 08/16/21 2054 08/17/21 0500 08/17/21 0900  BP: 98/65 (!) 98/40 (!) 111/42 100/70  Pulse: 98 74 99 100  Resp: 18 15 17    Temp: 97.6 F (36.4 C) 98.5 F (36.9 C) 98.9 F (37.2 C) 97.9 F (36.6 C)  TempSrc: Oral Oral Oral Oral  SpO2: 99% 100% 95% 96%  Weight:       No intake or output data in the 24 hours ending 08/17/21 1229  Filed Weights   08/13/21 1130 08/15/21 0730 08/15/21 1115  Weight: 52.8 kg 49.9 kg 47.9 kg    Examination:  no distress  EOMI NCAT arcus senilis + no icterus no pallor Neck soft supple S1-S2 no murmur no rub no gallop Abdomen soft no rebound  slight distension Neurologically intact moving 4 limbs equally without deficit   Data Reviewed: personally reviewed   CBC    Component Value Date/Time   WBC 8.6 08/15/2021 0100   RBC 3.46 (L) 08/15/2021 0100   HGB 10.4 (L) 08/15/2021 0100   HGB 10.6 (L) 07/13/2018 0814   HGB 8.7 (L) 09/24/2011 1038   HCT 31.0 (L) 08/15/2021 0100   HCT 33.0 (L) 07/13/2018 0814   HCT 27.3 (L) 09/24/2011 1038   PLT 273 08/15/2021 0100   PLT 203 07/13/2018  0814   MCV 89.6 08/15/2021 0100   MCV 91 07/13/2018 0814   MCV 94.5 09/24/2011 1038   MCH 30.1 08/15/2021 0100   MCHC 33.5 08/15/2021 0100   RDW 15.8 (H) 08/15/2021 0100   RDW 16.9 (H) 07/13/2018 0814   RDW 20.1 (H) 09/24/2011 1038   LYMPHSABS 1.3 08/06/2021 0951   LYMPHSABS 1.1 09/24/2011 1038   MONOABS 0.5 08/06/2021 0951   MONOABS 0.6  09/24/2011 1038   EOSABS 0.2 08/06/2021 0951   EOSABS 0.1 09/24/2011 1038   BASOSABS 0.0 08/06/2021 0951   BASOSABS 0.0 09/24/2011 1038   CMP Latest Ref Rng & Units 08/15/2021 08/14/2021 08/13/2021  Glucose 70 - 99 mg/dL 105(H) 115(H) 103(H)  BUN 8 - 23 mg/dL 17 11 30(H)  Creatinine 0.44 - 1.00 mg/dL 4.59(H) 3.34(H) 5.65(H)  Sodium 135 - 145 mmol/L 131(L) 133(L) 131(L)  Potassium 3.5 - 5.1 mmol/L 3.6 2.9(L) 4.4  Chloride 98 - 111 mmol/L 97(L) 95(L) 92(L)  CO2 22 - 32 mmol/L 21(L) 24 20(L)  Calcium 8.9 - 10.3 mg/dL 7.9(L) 8.3(L) 8.1(L)  Total Protein 6.5 - 8.1 g/dL - - -  Total Bilirubin 0.3 - 1.2 mg/dL - - -  Alkaline Phos 38 - 126 U/L - - -  AST 15 - 41 U/L - - -  ALT 0 - 44 U/L - - -     Radiology Studies: No results found.   Scheduled Meds:  amoxicillin-clavulanate  1 tablet Oral Daily   Chlorhexidine Gluconate Cloth  6 each Topical Q0600   [START ON 08/18/2021] cinacalcet  30 mg Oral Once per day on Mon Wed Fri   doxercalciferol  4 mcg Intravenous Q M,W,F-HD   feeding supplement (NEPRO CARB STEADY)  237 mL Oral BID BM   heparin  5,000 Units Subcutaneous Q8H   pantoprazole  40 mg Oral Daily   sodium chloride flush  3 mL Intravenous Q12H   zolpidem  5 mg Oral QHS   Continuous Infusions:     LOS: 11 days   Time spent: Phelps, MD [facility] To contact the attending provider between 7A-7P or the covering provider during after hours 7P-7A, please log into the web site [email or web address] and access using universal [place] password for that web site. If you do not have the password, please call the hospital operator.  08/17/2021, 12:29 PM

## 2021-08-17 NOTE — Progress Notes (Signed)
Patient ID: [name], female   DOB: [birth year], 70 y.o.   MRN: [id number] [facility] Surgery Progress Note:   * No surgery found *  Subjective: Mental status is clear.  Complaints not complaining of abdominal pain like yesterday.  . Objective: Vital signs in last 24 hours: Temp:  [97.6 F (36.4 C)-98.9 F (37.2 C)] 97.9 F (36.6 C) (09/04 0900) Pulse Rate:  [74-100] 100 (09/04 0900) Resp:  [15-18] 17 (09/04 0500) BP: (98-111)/(40-70) 100/70 (09/04 0900) SpO2:  [95 %-100 %] 96 % (09/04 0900)  Intake/Output from previous day: No intake/output data recorded. Intake/Output this shift: No intake/output data recorded.  Physical Exam: Work of breathing is normal.  No abdominal pain.  She did have a small BM  Lab Results:  No results found for this or any previous visit (from the past 48 hour(s)).  Radiology/Results: No results found.  Anti-infectives: Anti-infectives (From admission, onward)    Start     Dose/Rate Route Frequency Ordered Stop   08/15/21 1015  amoxicillin-clavulanate (AUGMENTIN) 500-125 MG per tablet 500 mg        1 tablet Oral Daily 08/15/21 1008     08/15/21 1000  amoxicillin-clavulanate (AUGMENTIN) 875-125 MG per tablet 1 tablet  Status:  Discontinued        1 tablet Oral Every 12 hours 08/15/21 0947 08/15/21 1008   08/06/21 2300  piperacillin-tazobactam (ZOSYN) IVPB 2.25 g  Status:  Discontinued        2.25 g 100 mL/hr over 30 Minutes Intravenous Every 8 hours 08/06/21 1611 08/15/21 0947   08/06/21 2200  piperacillin-tazobactam (ZOSYN) IVPB 3.375 g  Status:  Discontinued        3.375 g 100 mL/hr over 30 Minutes Intravenous Every 8 hours 08/06/21 1606 08/06/21 1611   08/06/21 1530  piperacillin-tazobactam (ZOSYN) IVPB 3.375 g        3.375 g 100 mL/hr over 30 Minutes Intravenous  Once 08/06/21 1516 08/06/21 1632       Assessment/Plan: Problem List: Patient Active Problem List   Diagnosis Date Noted   Protein-calorie malnutrition, severe  08/12/2021   Diverticulitis of colon with perforation 08/06/2021   Acute respiratory failure with hypoxia ([place]) 08/06/2021   Major depressive disorder, recurrent episode, moderate ([place]) 02/23/2021   TIA (transient ischemic attack) 02/21/2021   Prolonged QT interval 02/21/2021   NSTEMI (non-ST elevated myocardial infarction) (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   Dependence on renal dialysis ([place]) 07/24/2019   Iron deficiency anemia, unspecified 06/09/2019   Age-related osteoporosis without current pathological fracture 04/17/2019   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   Coagulation defect, unspecified ([place]) 04/17/2019   Diarrhea, unspecified 04/17/2019   Encounter for screening for depression 04/17/2019   Headache, unspecified 04/17/2019   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   Pain, unspecified 04/17/2019   Polyp of colon 04/17/2019   Primary generalized (osteo)arthritis 04/17/2019   Pruritus, unspecified 04/17/2019   Pure hypercholesterolemia, unspecified 04/17/2019   Secondary hyperparathyroidism of renal origin ([place]) 04/17/2019   Gastro-esophageal reflux disease without esophagitis 04/17/2019   Essential (  primary) hypertension 04/17/2019   Hyperlipidemia, unspecified 04/17/2019   Transient cerebral ischemic attack, unspecified 04/17/2019   Hypoxemia 12/13/2018   Atrial fibrillation ([place]) 03/23/2017   Prolonged Q-T interval on ECG 02/24/2017   Hypokalemia 02/24/2017   Acute  ischemic stroke ([place]) 02/23/2017   Malnutrition of moderate degree 12/24/2016   Problem with dialysis access ([place]) 12/21/2016   Acute ischemic colitis ([place]) 05/16/2016   Colitis 05/15/2016   Rectal bleeding 05/15/2016   Neurologic abnormality 11/19/2015   Hyperkalemia 11/19/2015   Anxiety 11/19/2015   Insomnia 11/19/2015   Gait instability    Stroke-like symptom 11/16/2015   Vestibular neuritis 11/16/2015   Stroke (cerebrum) ([place]) 11/16/2015   Dizziness 05/09/2015   Ataxia 05/09/2015   H/O: CVA (cerebrovascular accident) 05/09/2015   Left facial numbness 05/09/2015   Left leg numbness 05/09/2015   Hyperlipidemia    Acute on chronic diastolic heart failure ([place])    Shortness of breath 04/01/2013   HTN (hypertension) 04/01/2013   Cholecystitis, acute 11/07/2012   End-stage renal disease on hemodialysis ([place]) 11/07/2012   Anemia in chronic kidney disease 11/07/2012    OK for discharge from surgery standpoint.   * No surgery found *    LOS: 11 days   Matt B. Hassell Done, MD, [facility] [phone] to reach the surgeon on call.    08/17/2021 9:49 AM

## 2021-08-17 NOTE — Evaluation (Signed)
Occupational Therapy Evaluation Patient Details Name: [name] MRN: [id number] DOB: 08-Sep-1951 Today's Date: 08/17/2021    History of Present Illness Pt is a 69yo female presenting to [facility] ED on 8/24 with complaints of abdominal pain; CT found acute sigmoid diverticulitis with colon perforation. PMH: ESRD on HD MWF, postrenal transplant 1996, afib, CAD, CHF, CVA with LUE weakness.   Clinical Impression   Pt admitted for concerns listed above. PTA pt reported that she was independent with all ADL's and functional mobility, using a rollator as needed. She lives with her mother who has Alzhiemer's disease and her sister comes over daily to take care of her. At this time, pt is very weak and unstable, requiring mod-max A for bed mobility and transfers, as well as max A +2 for all LB ADL's. Pt will benefit from SNF level therapies at this time, to maximize her return to independence. OT will follow acutely.     Follow Up Recommendations  SNF;Supervision/Assistance - 24 hour    Equipment Recommendations  Other (comment) (TBD at next venue of care)    Recommendations for Other Services       Precautions / Restrictions Precautions Precautions: Fall Precaution Comments: Pt very weak. Restrictions Weight Bearing Restrictions: No      Mobility Bed Mobility Overal bed mobility: Needs Assistance Bed Mobility: Supine to Sit;Sit to Supine     Supine to sit: Mod assist;HOB elevated Sit to supine: Max assist   General bed mobility comments: Pt requiring mod-max A for trunk and BLE managemnt.    Transfers Overall transfer level: Needs assistance Equipment used: 1 person hand held assist Transfers: Sit to/from [employer] Sit to Stand: Max assist Stand pivot transfers: Max assist       General transfer comment: Pt complete sit<>stands and pivot transfer via face to face HHA, max A to power up and to steady.    Balance Overall balance assessment: Needs  assistance Sitting-balance support: Feet supported;Single extremity supported Sitting balance-Leahy Scale: Poor     Standing balance support: Bilateral upper extremity supported Standing balance-Leahy Scale: Zero Standing balance comment: max A to perform standing                           ADL either performed or assessed with clinical judgement   ADL Overall ADL's : Needs assistance/impaired Eating/Feeding: Set up;Sitting   Grooming: Set up;Cueing for sequencing;Sitting   Upper Body Bathing: Minimal assistance;Sitting   Lower Body Bathing: Maximal assistance;+2 for physical assistance;+2 for safety/equipment;Sitting/lateral leans;Sit to/from stand   Upper Body Dressing : Minimal assistance;Sitting   Lower Body Dressing: Maximal assistance;+2 for physical assistance;+2 for safety/equipment;Sitting/lateral leans;Sit to/from stand   Toilet Transfer: Maximal assistance;Stand-pivot;Cueing for sequencing;Cueing for safety   Toileting- Clothing Manipulation and Hygiene: Maximal assistance;+2 for physical assistance;+2 for safety/equipment;Sitting/lateral lean;Sit to/from stand   Tub/ Shower Transfer: Maximal assistance;Stand-pivot   Functional mobility during ADLs: Maximal assistance General ADL Comments: Pt requiring max A +2 for all LB ADL's and functional mobility     Vision Baseline Vision/History: 1 Wears glasses Ability to See in Adequate Light: 0 Adequate Patient Visual Report: No change from baseline Vision Assessment?: No apparent visual deficits     Perception Perception Perception Tested?: No   Praxis Praxis Praxis tested?: Not tested    Pertinent Vitals/Pain Pain Assessment: No/denies pain     Hand Dominance Right   Extremity/Trunk Assessment Upper Extremity Assessment Upper Extremity Assessment: Generalized weakness   Lower  Extremity Assessment Lower Extremity Assessment: Defer to PT evaluation   Cervical / Trunk Assessment Cervical /  Trunk Assessment: Kyphotic   Communication Communication Communication: No difficulties   Cognition Arousal/Alertness: Awake/alert Behavior During Therapy: WFL for tasks assessed/performed Overall Cognitive Status: No family/caregiver present to determine baseline cognitive functioning                                 General Comments: Pt requiring multiple verbal cues to sequence tasks, following 1 step commands with increased time and cuing.   General Comments  VSS on RA    Exercises     Shoulder Instructions      Home Living Family/patient expects to be discharged to:: Private residence Living Arrangements: Parent Available Help at Discharge: Family;Available PRN/intermittently Type of Home: House Home Access: Stairs to enter [employer] of Steps: 3 Entrance Stairs-Rails: Left Home Layout: One level     Bathroom Shower/Tub: Chief Strategy Officer: Standard Bathroom Accessibility: Yes   Home Equipment: Shower seat;Cane - single point;Walker - 4 wheels   Additional Comments: Pt's mother has dementia, sister cares for mother and pt reports sister sometimes cares for her as well.      Prior Functioning/Environment Level of Independence: Independent with assistive device(s)        Comments: Pt reports she uses rollator for mobility; independent w/ADLs.        OT Problem List: Decreased strength;Decreased activity tolerance;Impaired balance (sitting and/or standing);Decreased coordination;Decreased safety awareness;Decreased knowledge of use of DME or AE      OT Treatment/Interventions: Self-care/ADL training;Therapeutic exercise;Energy conservation;DME and/or AE instruction;Therapeutic activities;Balance training;Patient/family education    OT Goals(Current goals can be found in the care plan section) Acute Rehab OT Goals Patient Stated Goal: To get stronger OT Goal Formulation: With patient Time For Goal Achievement:  08/31/21 Potential to Achieve Goals: Fair ADL Goals Pt Will Perform Lower Body Bathing: with mod assist;sitting/lateral leans;sit to/from stand Pt Will Perform Lower Body Dressing: with mod assist;sitting/lateral leans;sit to/from stand Pt Will Transfer to Toilet: with min assist;stand pivot transfer Pt Will Perform Toileting - Clothing Manipulation and hygiene: with mod assist;sitting/lateral leans;sit to/from stand Additional ADL Goal #1: Pt will tolerate sitting EOB for 5 mins with no external support, maintaining midline.  OT Frequency: Min 2X/week   Barriers to D/C:            Co-evaluation              AM-PAC OT "6 Clicks" Daily Activity     Outcome Measure Help from another person eating meals?: A Little Help from another person taking care of personal grooming?: A Little Help from another person toileting, which includes using toliet, bedpan, or urinal?: A Lot Help from another person bathing (including washing, rinsing, drying)?: A Lot Help from another person to put on and taking off regular upper body clothing?: A Little Help from another person to put on and taking off regular lower body clothing?: A Lot 6 Click Score: 15   End of Session Equipment Utilized During Treatment: Gait belt;Rolling walker Nurse Communication: Mobility status  Activity Tolerance: Patient tolerated treatment well Patient left: in bed;with call bell/phone within reach;with bed alarm set  OT Visit Diagnosis: Unsteadiness on feet (R26.81);Other abnormalities of gait and mobility (R26.89);Muscle weakness (generalized) (M62.81)                Time: 0865-7846 OT Time Calculation (min): 23  min Charges:  OT General Charges $OT Visit: 1 Visit OT Evaluation $OT Eval Moderate Complexity: 1 Mod OT Treatments $Self Care/Home Management : 8-22 mins  Yvette Roark H., OTR/L Acute Rehabilitation  Rolanda Campa Elane Bing Plume 08/17/2021, 2:33 PM

## 2021-08-18 LAB — CBC
HCT: 34.5 % — ABNORMAL LOW (ref 36.0–46.0)
Hemoglobin: 11 g/dL — ABNORMAL LOW (ref 12.0–15.0)
MCH: 29.4 pg (ref 26.0–34.0)
MCHC: 31.9 g/dL (ref 30.0–36.0)
MCV: 92.2 fL (ref 80.0–100.0)
Platelets: 296 10*3/uL (ref 150–400)
RBC: 3.74 MIL/uL — ABNORMAL LOW (ref 3.87–5.11)
RDW: 16 % — ABNORMAL HIGH (ref 11.5–15.5)
WBC: 10.4 10*3/uL (ref 4.0–10.5)
nRBC: 0 % (ref 0.0–0.2)

## 2021-08-18 LAB — BASIC METABOLIC PANEL
Anion gap: 13 (ref 5–15)
BUN: 47 mg/dL — ABNORMAL HIGH (ref 8–23)
CO2: 19 mmol/L — ABNORMAL LOW (ref 22–32)
Calcium: 9 mg/dL (ref 8.9–10.3)
Chloride: 100 mmol/L (ref 98–111)
Creatinine, Ser: 6.53 mg/dL — ABNORMAL HIGH (ref 0.44–1.00)
GFR, Estimated: 6 mL/min — ABNORMAL LOW (ref >60)
Glucose, Bld: 122 mg/dL — ABNORMAL HIGH (ref 70–99)
Potassium: 6.1 mmol/L — ABNORMAL HIGH (ref 3.5–5.1)
Sodium: 132 mmol/L — ABNORMAL LOW (ref 135–145)

## 2021-08-18 MED ORDER — PANTOPRAZOLE SODIUM 40 MG PO TBEC: 40.0000 mg | DELAYED_RELEASE_TABLET | Freq: Every day | ORAL | Status: DC

## 2021-08-18 MED ORDER — BUSPIRONE HCL 10 MG PO TABS: ORAL_TABLET | ORAL | 0 refills | Status: DC

## 2021-08-18 MED ORDER — ALPRAZOLAM 0.25 MG PO TABS: 0.2500 mg | ORAL_TABLET | Freq: Every day | ORAL | 0 refills | Status: DC | PRN

## 2021-08-18 NOTE — Progress Notes (Signed)
Physical Therapy Treatment Patient Details Name: [name] MRN: [id number] DOB: Mar 24, 1951 Today's Date: 08/18/2021    History of Present Illness Pt is a 70yo female presenting to [facility] ED on 8/24 with complaints of abdominal pain; CT found acute sigmoid diverticulitis with colon perforation. PMH: ESRD on HD MWF, postrenal transplant 1996, afib, CAD, CHF, CVA with LUE weakness.    PT Comments    Pt progressing towards goals. Was able to stand with max A this session, however, upon standing, had episode of bowel incontinence which limited further mobility. Was able to roll with min A for clean up. Max multimodal cues for sequencing this session. Current recommendations appropriate. Will continue to follow acutely.    Follow Up Recommendations  SNF     Equipment Recommendations  Rolling walker with 5" wheels;3in1 (PT);Wheelchair (measurements PT);Wheelchair cushion (measurements PT)    Recommendations for Other Services       Precautions / Restrictions Precautions Precautions: Fall Restrictions Weight Bearing Restrictions: No    Mobility  Bed Mobility Overal bed mobility: Needs Assistance Bed Mobility: Supine to Sit;Sit to Supine;Rolling Rolling: Min assist   Supine to sit: Max assist Sit to supine: Max assist   General bed mobility comments: Max A for trunk assist. Max multimodal cues for sequencing as she kept placing feet back up onto bed when attempting to sit up. Min A for rolling upon return to supine for clean up of BM.    Transfers Overall transfer level: Needs assistance Equipment used: 1 person hand held assist Transfers: Sit to/from Stand Sit to Stand: Max assist         General transfer comment: Max A for lift assist and steadying. Pt holding to PT arms for support. Pt with episode of bowel incontinence so further mobility limited.  Ambulation/Gait                 Stairs             Wheelchair Mobility    Modified Rankin (Stroke  Patients Only)       Balance Overall balance assessment: Needs assistance Sitting-balance support: Feet supported;Single extremity supported Sitting balance-Leahy Scale: Poor Sitting balance - Comments: Reliant on at least one UE support and min guard to min A for stability.   Standing balance support: Bilateral upper extremity supported Standing balance-Leahy Scale: Zero Standing balance comment: max A to perform standing                            Cognition Arousal/Alertness: Awake/alert Behavior During Therapy: WFL for tasks assessed/performed Overall Cognitive Status: No family/caregiver present to determine baseline cognitive functioning                                 General Comments: Difficulty sequencing noted and required max multimodal cues.      Exercises      General Comments        Pertinent Vitals/Pain Pain Assessment: No/denies pain    Home Living                      Prior Function            PT Goals (current goals can now be found in the care plan section) Acute Rehab PT Goals Patient Stated Goal: To get stronger PT Goal Formulation: With patient Time For Goal Achievement: 08/29/21 Potential  to Achieve Goals: Fair Progress towards PT goals: Progressing toward goals    Frequency    Min 2X/week      PT Plan Current plan remains appropriate    Co-evaluation              AM-PAC PT "6 Clicks" Mobility   Outcome Measure  Help needed turning from your back to your side while in a flat bed without using bedrails?: A Little Help needed moving from lying on your back to sitting on the side of a flat bed without using bedrails?: A Lot Help needed moving to and from a bed to a chair (including a wheelchair)?: A Lot Help needed standing up from a chair using your arms (e.g., wheelchair or bedside chair)?: A Lot Help needed to walk in hospital room?: Total Help needed climbing 3-5 steps with a railing? :  Total 6 Click Score: 11    End of Session Equipment Utilized During Treatment: Gait belt Activity Tolerance: Patient limited by fatigue Patient left: in bed;with call bell/phone within reach;with bed alarm set Nurse Communication: Mobility status PT Visit Diagnosis: Muscle weakness (generalized) (M62.81);Unsteadiness on feet (R26.81);Other abnormalities of gait and mobility (R26.89)     Time: 8315-1761 PT Time Calculation (min) (ACUTE ONLY): 20 min  Charges:  $Therapeutic Activity: 8-22 mins                     [name], DPT  Acute Rehabilitation Services  Pager: [phone] Office: [phone]    [name] 08/18/2021, 4:07 PM

## 2021-08-18 NOTE — NC FL2 (Addendum)
[place] LEVEL OF CARE SCREENING TOOL     IDENTIFICATION  Patient Name: [name] Birthdate: 09-03-1951 Sex: female Admission Date (Current Location): 08/06/2021  [facility] and [place] Number:  Herbalist and Address:  [facility], [place] [address] [place], [place] [address]      Provider Number: [id number]  Attending Physician Name and Address:  Nita Sells, MD  Relative Name and Phone Number:       Current Level of Care: Hospital Recommended Level of Care: [place] Prior Approval Number:    Date Approved/Denied:   PASRR Number: [id number]  Discharge Plan: SNF    Current Diagnoses: Patient Active Problem List   Diagnosis Date Noted   Protein-calorie malnutrition, severe 08/12/2021   Diverticulitis of colon with perforation 08/06/2021   Acute respiratory failure with hypoxia ([place]) 08/06/2021   Major depressive disorder, recurrent episode, moderate ([place]) 02/23/2021   TIA (transient ischemic attack) 02/21/2021   Prolonged QT interval 02/21/2021   NSTEMI (non-ST elevated myocardial infarction) ([place]) 05/14/2020   Volume overload 05/13/2020   Hypotension after procedure 03/16/2020   Chest pain 03/16/2020   Change in mental state 03/15/2020   Ventricular tachycardia ([place]) 03/15/2020   Seizure ([place]) 03/15/2020   Unresponsive episode 03/15/2020   Pulmonary edema 59/16/3846   Complication of vascular access for dialysis 12/21/2019   Breakdown (mechanical) of surgically created arteriovenous fistula, initial encounter ([place]) 12/18/2019   Weakness 10/11/2019   Aneurysm artery, 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   Dependence on renal dialysis ([place]) 07/24/2019   Iron deficiency anemia, unspecified 06/09/2019   Age-related osteoporosis without current pathological fracture 04/17/2019   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   Coagulation defect, unspecified ([place]) 04/17/2019    Diarrhea, unspecified 04/17/2019   Encounter for screening for depression 04/17/2019   Headache, unspecified 04/17/2019   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   Pain, unspecified 04/17/2019   Polyp of colon 04/17/2019   Primary generalized (osteo)arthritis 04/17/2019   Pruritus, unspecified 04/17/2019   Pure hypercholesterolemia, unspecified 04/17/2019   Secondary hyperparathyroidism of renal origin ([place]) 04/17/2019   Gastro-esophageal reflux disease without esophagitis 04/17/2019   Essential (primary) hypertension 04/17/2019   Hyperlipidemia, unspecified 04/17/2019   Transient cerebral ischemic attack, unspecified 04/17/2019   Hypoxemia 12/13/2018   Atrial fibrillation (HCC) 03/23/2017   Prolonged Q-T interval on ECG 02/24/2017   Hypokalemia 02/24/2017   Acute ischemic stroke ([place]) 02/23/2017   Malnutrition of moderate degree 12/24/2016   Problem with dialysis access ([name]) 12/21/2016   Acute ischemic colitis ([place]) 05/16/2016   Colitis 05/15/2016   Rectal bleeding 05/15/2016   Neurologic abnormality 11/19/2015   Hyperkalemia 11/19/2015   Anxiety 11/19/2015   Insomnia 11/19/2015   Gait instability    Stroke-like symptom 11/16/2015   Vestibular neuritis 11/16/2015   Stroke (cerebrum) ([place]) 11/16/2015   Dizziness 05/09/2015   Ataxia 05/09/2015   H/O: CVA (cerebrovascular accident) 05/09/2015   Left facial numbness 05/09/2015   Left leg numbness 05/09/2015   Hyperlipidemia    Acute on chronic diastolic heart failure ([place])    Shortness of breath 04/01/2013   HTN (hypertension) 04/01/2013   Cholecystitis, acute 11/07/2012   End-stage renal disease on hemodialysis ([place]) 11/07/2012   Anemia in chronic kidney disease 11/07/2012    Orientation RESPIRATION BLADDER Height & Weight     Self, Time, Situation, Place  Normal Continent Weight: 106 lb 4.2 oz (48.2 kg) Height:     BEHAVIORAL SYMPTOMS/MOOD NEUROLOGICAL BOWEL NUTRITION STATUS  Incontinent Diet (See dc summary)   AMBULATORY STATUS COMMUNICATION OF NEEDS Skin   Extensive Assist Verbally Normal                       Personal Care Assistance Level of Assistance  Bathing, Feeding, Dressing Bathing Assistance: Maximum assistance Feeding assistance: Limited assistance Dressing Assistance: Limited assistance     Functional Limitations Info             SPECIAL CARE FACTORS FREQUENCY  PT (By licensed PT), OT (By licensed OT)     PT Frequency: 5x/week OT Frequency: 5x/week            Contractures Contractures Info: Not present    Additional Factors Info  Code Status, Allergies, Psychotropic Code Status Info: Full Allergies Info: Sulfa Antibiotics, Adhesive (Tape) Psychotropic Info: Ambien         Current Medications (08/18/2021):  This is the current hospital active medication list Current Facility-Administered Medications  Medication Dose Route Frequency Provider Last Rate Last Admin   acetaminophen (TYLENOL) tablet 650 mg  650 mg Oral Q6H PRN Fuller Plan A, MD   650 mg at 08/18/21 0201   Or   acetaminophen (TYLENOL) suppository 650 mg  650 mg Rectal Q6H PRN Fuller Plan A, MD       albuterol (PROVENTIL) (2.5 MG/3ML) 0.083% nebulizer solution 2.5 mg  2.5 mg Nebulization Q6H PRN Norval Morton, MD       ALPRAZolam Duanne Moron) tablet 0.25 mg  0.25 mg Oral Daily PRN Fuller Plan A, MD   0.25 mg at 08/18/21 0201   busPIRone (BUSPAR) tablet 10 mg  10 mg Oral BID PRN Norval Morton, MD       Chlorhexidine Gluconate Cloth 2 % PADS 6 each  6 each Topical Q0600 Penninger, Lindsay, PA   6 each at 08/17/21 0503   cinacalcet (SENSIPAR) tablet 30 mg  30 mg Oral Once per day on Mon Wed Fri Stovall, Kathryn R, PA-C       doxercalciferol (HECTOROL) injection 4 mcg  4 mcg Intravenous Q M,W,F-HD Loren Racer, PA-C   4 mcg at 08/18/21 1004   feeding supplement (NEPRO CARB STEADY) liquid 237 mL  237 mL Oral BID BM Jillyn Ledger, PA-C   237 mL at 08/17/21 1342   ferric citrate  (AURYXIA) tablet 420 mg  420 mg Oral TID WC Nita Sells, MD   420 mg at 08/17/21 1725   heparin injection 5,000 Units  5,000 Units Subcutaneous Q8H Smith, Rondell A, MD   5,000 Units at 08/17/21 2117   ondansetron (ZOFRAN) tablet 4 mg  4 mg Oral Q6H PRN Fuller Plan A, MD   4 mg at 08/18/21 0739   Or   ondansetron (ZOFRAN) injection 4 mg  4 mg Intravenous Q6H PRN Fuller Plan A, MD   4 mg at 08/12/21 0116   pantoprazole (PROTONIX) EC tablet 40 mg  40 mg Oral Daily Smith, Rondell A, MD   40 mg at 08/17/21 0925   polyvinyl alcohol (LIQUIFILM TEARS) 1.4 % ophthalmic solution 1 drop  1 drop Both Eyes TID PRN Fuller Plan A, MD       sodium chloride flush (NS) 0.9 % injection 3 mL  3 mL Intravenous Q12H Smith, Rondell A, MD   3 mL at 08/17/21 2118   zolpidem (AMBIEN) tablet 5 mg  5 mg Oral QHS Smith, Rondell A, MD   5 mg at 08/17/21 2117  Discharge Medications: Please see discharge summary for a list of discharge medications.  Relevant Imaging Results:  Relevant Lab Results:   Additional Information SS#: [id number]; HD MWF 12pm at [facility]. [employer] 02/10/20, 03/09/20, 12/05/20.   Benard Halsted, LCSW

## 2021-08-18 NOTE — TOC Initial Note (Signed)
Transition of Care [facility]) - Initial/Assessment Note    Patient Details  Name: [name] MRN: [id number] Date of Birth: 19-Sep-1951  Transition of Care [facility]) CM/SW Contact:    Benard Halsted, [place] Phone Number: 08/18/2021, 3:02 PM  Clinical Narrative:                 CSW received consult for possible SNF placement at time of discharge. CSW spoke with patient. Patient reported that patient's family is currently unable to care for patient at their home given patient's current physical needs and fall risk. Patient expressed understanding of PT recommendation and is agreeable to SNF placement at time of discharge. CSW discussed insurance authorization process and will provide Medicare SNF ratings list. Patient has received 3 COVID vaccines. Patient expressed being hopeful for rehab and to feel better soon. CSW discussed barriers finding a SNF that will transport to dialysis. [facility] able to accept patient if no other offers available and patient would be agreeable to them as she has heard of that facility. Patient uses [facility] for dialysis transportation. CSW will check back tomorrow for other offers. CSW paged PT to see patient so insurance authorization can be started.   Skilled Nursing Rehab Facilities-   [email or web address] Ratings out of 5 possible    Name Address  Phone # [place] Inspection Overall  [facility] [address] [place] 5 1 4 4   [facility]  [address] [place], [place] [phone] 3 1 5 4   [facility] [place], [place] 3 1 1 1   [place] [facility], [place] 2 2 4 4   [facility] [address] [place] 3 1 2 1   [facility] [address] [place] 3 2 4 4   [facility] [address] [place] 5 1 2 2   [facility] [address], [place] 5 2 2 3   [facility] [place] 5 1 2 2   [facility] [phone], [place] [phone] 5 1 2 2   [facility] [address], [facility] [phone] 5 1 2 2   [place] [address] [place] 3 1 1 1      Expected Discharge Plan: Skilled Nursing Facility Barriers to Discharge: Insurance Authorization   Patient Goals and CMS Choice Patient states their goals for this hospitalization and ongoing recovery are:: Rehab CMS Medicare.gov Compare Post Acute Care list provided to:: Patient Choice offered to / list presented to : Patient  Expected Discharge Plan and Services Expected Discharge Plan: [place] In-house Referral: Clinical Social Work   Post Acute Care Choice: [place], Dialysis Living arrangements for the past 2 months: [place] Expected Discharge Date: 08/18/21                                    Prior Living Arrangements/Services Living arrangements for the past 2 months: Single Family Home Lives with:: Self Patient language and need for interpreter reviewed:: Yes        Need for Family Participation in Patient Care: Yes (Comment) Care giver support system in place?: No (comment)   Criminal Activity/Legal Involvement Pertinent to Current Situation/Hospitalization: No - Comment as needed  Activities of Daily Living      Permission Sought/Granted  Permission granted to share info w AGENCY: SNFs        Emotional Assessment Appearance:: Appears stated age Attitude/Demeanor/Rapport: Engaged Affect (typically observed): Accepting, Appropriate, Pleasant Orientation: : Oriented to Self, Oriented to Place, Oriented to  Time, Oriented to Situation Alcohol / Substance Use: Not Applicable Psych Involvement: No (comment)  Admission diagnosis:  Diverticulitis [K57.92] Nausea  [R11.0] Generalized abdominal pain [R10.84] Diverticulitis of colon with perforation [K57.20] Diarrhea, unspecified type [R19.7] Diverticulitis of large intestine with perforation, unspecified bleeding status [K57.20] Patient Active Problem List   Diagnosis Date Noted   Protein-calorie malnutrition, severe 08/12/2021   Diverticulitis of colon with perforation 08/06/2021   Acute respiratory failure with hypoxia ([place]) 08/06/2021   Major depressive disorder, recurrent episode, moderate (HCC) 02/23/2021   TIA (transient ischemic attack) 02/21/2021   Prolonged QT interval 02/21/2021   NSTEMI (non-ST elevated myocardial infarction) ([place]) 05/14/2020   Volume overload 05/13/2020   Hypotension after procedure 03/16/2020   Chest pain 03/16/2020   Change in mental state 03/15/2020   Ventricular tachycardia ([place]) 03/15/2020   Seizure ([place]) 03/15/2020   Unresponsive episode 03/15/2020   Pulmonary edema 36/14/4315   Complication of vascular access for dialysis 12/21/2019   Breakdown (mechanical) of surgically created arteriovenous fistula, initial encounter ([place]) 12/18/2019   Weakness 10/11/2019   Aneurysm artery, 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) 07/24/2019   Iron deficiency anemia, unspecified 06/09/2019   Age-related osteoporosis without current pathological fracture 04/17/2019   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   Coagulation defect, unspecified ([place]) 04/17/2019   Diarrhea, unspecified 04/17/2019   Encounter for screening for depression 04/17/2019   Headache, unspecified 04/17/2019   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   Pain, unspecified 04/17/2019   Polyp of colon 04/17/2019   Primary generalized (osteo)arthritis 04/17/2019   Pruritus, unspecified 04/17/2019   Pure hypercholesterolemia, unspecified 04/17/2019   Secondary hyperparathyroidism of renal origin ([place]) 04/17/2019   Gastro-esophageal reflux disease without  esophagitis 04/17/2019   Essential (primary) hypertension 04/17/2019   Hyperlipidemia, unspecified 04/17/2019   Transient cerebral ischemic attack, unspecified 04/17/2019   Hypoxemia 12/13/2018   Atrial fibrillation ([place]) 03/23/2017   Prolonged Q-T interval on ECG 02/24/2017   Hypokalemia 02/24/2017   Acute ischemic stroke ([place]) 02/23/2017   Malnutrition of moderate degree 12/24/2016   Problem with dialysis access ([place]) 12/21/2016   Acute ischemic colitis ([place]) 05/16/2016   Colitis 05/15/2016   Rectal bleeding 05/15/2016   Neurologic abnormality 11/19/2015   Hyperkalemia 11/19/2015   Anxiety 11/19/2015   Insomnia 11/19/2015   Gait instability    Stroke-like symptom 11/16/2015   Vestibular neuritis 11/16/2015   Stroke (cerebrum) ([place]) 11/16/2015   Dizziness 05/09/2015   Ataxia 05/09/2015   H/O: CVA (cerebrovascular accident) 05/09/2015   Left facial numbness 05/09/2015   Left leg numbness 05/09/2015   Hyperlipidemia    Acute on chronic diastolic heart failure (HCC)    Shortness of breath 04/01/2013   HTN (hypertension) 04/01/2013   Cholecystitis, acute 11/07/2012   End-stage renal disease on hemodialysis ([place]) 11/07/2012   Anemia in chronic kidney disease 11/07/2012   PCP:  [name], MD Pharmacy:   [facility] [address] [place] [address] [place] [address] Phone: [phone] Fax: [place], [place] [address] [place] [address] Phone: [phone] Fax: [phone]  [facility]  [name], [place] - [address] [employer], [place] [address] Phone: [phone] Fax: [phone]     Social Determinants of Health (SDOH) Interventions    Readmission Risk Interventions Readmission Risk Prevention Plan 08/18/2021  Transportation Screening Complete  Medication  Review Press photographer) Complete  PCP or Specialist appointment within 3-5 days of discharge Complete  HRI or Home Care Consult Complete  SW Recovery Care/Counseling Consult Complete  Palliative Care Screening Not [place] Complete  Some recent data might be hidden

## 2021-08-18 NOTE — Discharge Summary (Signed)
Physician Discharge Summary  [name] ZWC:[id number] DOB: April 06, 1951 DOA: 08/06/2021  PCP: [name], MD  Admit date: 08/06/2021 Discharge date: 08/18/2021  Time spent: 37 minutes  Recommendations for Outpatient Follow-up:  Requires adjustment of her EDW in outpatient setting as some hypotension with dialysis Get screening labs with CBC Chem-12 phosphorus in about 1 week at facility--was hyperkalemic on discharge possibly secondary to replacement Recommend outpatient consideration for colonoscopy Please consider outpatient lateral pelvis hematoma follow-up with scan if becomes symptomatic  Discharge Diagnoses:  MAIN problem for hospitalization   sigmoid diverticulitis with microperforation on admission without evidence of sepsis  Please see below for itemized issues addressed in [place]- refer to other progress notes for clarity if needed  Discharge Condition: Improved  Diet recommendation: Renal  Filed Weights   08/15/21 0730 08/15/21 1115 08/18/21 0801  Weight: 49.9 kg 47.9 kg 48.2 kg    History of present illness:  70 year old black female community dwelling 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 4.9X 2.4 cm lateral pelvis chronic hematoma   Renal consulted and continues to follow   General surgery consulted, recommended conservative management-labs normalized started on sips of clears   General surgery signed off antibiotics were narrowed She is currently awaiting skilled placement    Hospital Course:  Acute sigmoid diverticulitis +  microperforation Zosyn-->Augmentin 9/2 and discontinued antibiotics on 9/4 continue regular diet, no concern for abscess surgery has signed off Tylenol to be used for pain and try to avoid medications that have potential for encephalopathy especially as she is a renal placement Will need skilled facility placement when bed is available ESRD MWF/MGUS/renal transplant 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 fib prior VT DCCV 04/02/2020--not on anticoagulation secondary to bleeding Seemed rate controlled without intervention Outpatient monitoring Severe hypokalemia Resolved during hospitalization and was actually slightly hyperkalemic on discharge so this will be adjusted in the outpatient setting Anxiety Continue Xanax 0.25 daily as needed anxiety 5 x 3 cm lateral pelvic chronic hematoma Coincidently found--suppose this can be imaged in the outpatient setting not emergently  Consultations: Renal General surgery  Discharge Exam: Vitals:   08/18/21 1030 08/18/21 1100  BP: (!) 101/39 (!) 123/29  Pulse: (!) 104 (!) 102  Resp:    Temp:    SpO2:      Subj on day of d/c   Awake coherent no distress seen on HD unit  eating drinking  No cough no wheeze no nausea no vomiting  General Exam on discharge  EOMI NCAT no icterus no pallor no focal deficit no rales no rhonchi ROM intact moving 4 limbs equally Abdomen soft nontender no rebound no guarding No lower extremity edema Fistula on right side is being used, AV graft/fistula on the left side good thrill   Discharge Instructions   Discharge Instructions     Diet - low sodium heart healthy   Complete by: As directed    Increase  activity slowly   Complete by: As directed       Allergies as of  08/18/2021       Reactions   Sulfa Antibiotics Other (See Comments)   Per patient, both parents allergic-so will not take   Adhesive [tape] Itching        Medication List     STOP taking these medications    aspirin 81 MG EC tablet   losartan 50 MG tablet Commonly known as: COZAAR   sucralfate 1 g tablet Commonly known as: Carafate       TAKE these medications    acetaminophen 500 MG tablet Commonly known as: TYLENOL Take 1 tablet (500 mg total) by mouth every 6 (six) hours as needed. What changed: reasons to take this   albuterol 108 (90 Base) MCG/ACT inhaler Commonly known as: VENTOLIN HFA Inhale 2 puffs into the lungs every 6 (six) hours as needed for wheezing or shortness of breath.   ALLOPURINOL PO Take 1 tablet by mouth daily as needed (for gout flares).   ALPRAZolam 0.25 MG tablet Commonly known as: XANAX Take 1 tablet (0.25 mg total) by mouth daily as needed for anxiety.   Artificial Tears PF 0.1-0.3 % Soln Generic drug: Dextran 70-Hypromellose (PF) Place 1 drop into both eyes 3 (three) times daily as needed (for dryness).   atorvastatin 80 MG tablet Commonly known as: LIPITOR TAKE 1 TABLET (80 MG TOTAL) BY MOUTH DAILY.   Auryxia 1 GM 210 MG(Fe) tablet Generic drug: ferric citrate Take 420 mg by mouth 3 (three) times daily with meals.   busPIRone 10 MG tablet Commonly known as: BUSPAR TAKE 1 TABLET (10 MG TOTAL) BY MOUTH TWO TIMES DAILY. What changed:  how much to take how to take this when to take this reasons to take this   cinacalcet 30 MG tablet Commonly known as: SENSIPAR Take 30 mg by mouth daily.   lactulose 10 GM/15ML solution Commonly known as: CHRONULAC Take 20 g by mouth daily as needed for mild constipation.   ondansetron 8 MG tablet Commonly known as: ZOFRAN Take 8 mg by mouth 2 (two) times daily as needed for nausea or vomiting.   pantoprazole 40 MG tablet Commonly known as: PROTONIX Take 1 tablet (40 mg total) by  mouth daily.   zolpidem 10 MG tablet Commonly known as: AMBIEN Take 10 mg by mouth at bedtime.       Allergies  Allergen Reactions   Sulfa Antibiotics Other (See Comments)    Per patient, both parents allergic-so will not take   Adhesive [Tape] Itching    Follow-up Information     Cornett, Marcello Moores, MD Follow up on 09/09/2021.   Specialty: General Surgery Why: 1040am. Please arrive 30 minutes prior to your appointment for paperwork. Please bring a copy of your photo ID and insurance card. Contact information: [address] [place] [address] [phone]         [name], MD Follow up.   Specialty: Internal Medicine Contact information: [address]  [place] [address] [phone]                  The results of significant diagnostics from this hospitalization (including imaging, microbiology, ancillary and laboratory) are listed below for reference.    Significant Diagnostic Studies: DG Ribs Unilateral W/Chest Right  Result Date: 07/27/2021 CLINICAL DATA:  Rib pain. EXAM: RIGHT RIBS AND CHEST - 3+ VIEW COMPARISON:  July 07, 2021 FINDINGS: No  pneumothorax. Stable cardiomegaly. The hila and mediastinum are unchanged. No acute infiltrate. Kerley B lines and mild prominence of the interstitium identified. No rib fractures are noted. IMPRESSION: 1. No rib fractures or pneumothorax. 2. Cardiomegaly and mild pulmonary edema. 3. No other changes. Electronically Signed   By: Dorise Bullion III M.D.   On: 07/27/2021 10:00   CT ABDOMEN PELVIS W CONTRAST  Result Date: 08/10/2021 CLINICAL DATA:  Complication of diverticulitis suspected. EXAM: CT ABDOMEN AND PELVIS WITH CONTRAST TECHNIQUE: Multidetector CT imaging of the abdomen and pelvis was performed using the standard protocol following bolus administration of intravenous contrast. CONTRAST:  51mL OMNIPAQUE IOHEXOL 350 MG/ML SOLN COMPARISON:  CT abdomen dated 08/06/2021. FINDINGS:  Lower chest: Patchy chronic micronodularity at the bilateral lung bases, incompletely imaged, not significantly changed for greater than 5 years indicating benignity. Small RIGHT pleural effusion with adjacent atelectasis. Hepatobiliary: No focal liver abnormality is seen. Status post cholecystectomy. Pancreas: Unremarkable. No pancreatic ductal dilatation or surrounding inflammatory changes. Spleen: Normal in size without focal abnormality. Adrenals/Urinary Tract: Atrophic kidneys. LEFT renal cyst. No acute findings. Bladder is decompressed. Stomach/Bowel: Abnormal-appearing small bowel within the LEFT abdomen (proximal jejunum), with amorphous/thickened walls and surrounding mesenteric inflammation. There is new free intraperitoneal air suggesting bowel perforation, likely source being this abnormal-appearing small bowel in the LEFT abdomen, suspect bowel ischemia. Previous study showed abnormal appearing sigmoid/descending colon suggesting acute diverticulitis. This is not as clearly delineated on today's exam. The free air may, however, be the result of a ruptured diverticulitis and the abnormal-appearing small bowel in the LEFT abdomen may be reactive in nature. No extravasated oral contrast to confirm small-bowel perforation. No dilated large or small bowel loops are identified elsewhere in the abdomen and pelvis Vascular/Lymphatic: Aortic atherosclerosis. Heavy atherosclerotic changes of the aortic branch vessels. No acute-appearing vascular abnormality. 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 No enlarged lymph nodes are seen. Reproductive: Uterus and bilateral adnexa are unremarkable. Other: Small amount of free fluid within the abdomen and pelvis. No circumscribed fluid collection, however, early abscess collection in the LEFT abdomen is difficult to exclude. Musculoskeletal: No acute-appearing osseous abnormality. Previously described chronic changes of renal osteodystrophy  again noted. IMPRESSION: 1. New free intraperitoneal air indicating bowel perforation. 2. Abnormal-appearing small bowel within the LEFT abdomen (proximal jejunum), with amorphous/thickened walls and surrounding mesenteric inflammation. Findings are suspicious for small bowel perforation at this level, possibly indicating small bowel ischemia. Alternatively, this may represent reactive bowel wall thickening (see below). 3. Previous study showed abnormal appearing sigmoid/descending colon suggesting acute diverticulitis. This is not as clearly delineated on today's exam. The free air, however, may be the result of a ruptured diverticulitis with the abnormal-appearing small bowel in the LEFT abdomen being reactive in nature. 4. Scattered free fluid within the abdomen and pelvis. No circumscribed fluid collection, however, early abscess collection in the LEFT abdomen is difficult to exclude. 5. Small RIGHT pleural effusion with adjacent atelectasis. 6. 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 Aortic Atherosclerosis (ICD10-I70.0). Critical Value/emergent results were called by telephone at the time of interpretation on 08/10/2021 at 4:58 pm to provider Dr. Cathlean Sauer, who verbally acknowledged these results. Electronically Signed   By: Franki Cabot M.D.   On: 08/10/2021 17:00   CT ABDOMEN PELVIS W CONTRAST  Result Date: 08/06/2021 CLINICAL DATA:  Nausea/vomiting Abdominal pain, acute, nonlocalized EXAM: CT ABDOMEN AND PELVIS WITH CONTRAST TECHNIQUE: Multidetector CT imaging of the abdomen and pelvis was performed  using the standard protocol following bolus administration of intravenous contrast. CONTRAST:  171mL OMNIPAQUE IOHEXOL 350 MG/ML SOLN COMPARISON:  07/21/2020 FINDINGS: Lower chest: Dependent bibasilar atelectasis. Cardiomegaly. Coronary artery calcification. Hepatobiliary: No focal liver abnormality is seen. Status post cholecystectomy. No biliary dilatation. Pancreas:  Unremarkable. No pancreatic ductal dilatation or surrounding inflammatory changes. Spleen: Normal in size without focal abnormality. Adrenals/Urinary Tract: Unremarkable adrenal glands. Advanced bilateral renal atrophy. Left renal cyst has slightly increased in size from prior now measuring 6.4 cm (previously 5.6 cm). No hydronephrosis. Urinary bladder is decompressed, limiting its evaluation. Stomach/Bowel: Stomach is unremarkable. No dilated loops of bowel. There is a focally thickened short segment of sigmoid colon which contains multiple diverticula. Adjacent pericolonic fat stranding, free fluid, and numerous foci of extraluminal air (series 3, images 38-47). No organized fluid collection or abscess. There is mild long segment thickening throughout the remaining colon. There are multiple thickened appearing loops of small bowel within the abdomen, likely a reactive enteritis. Vascular/Lymphatic: Advanced atherosclerotic calcifications throughout the aortoiliac axis. 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 No lymphadenopathy. Reproductive: Uterus and bilateral adnexa are unremarkable. Other: Small volume ascites. Chronic soft tissue density in the anterolateral pelvis with scattered internal calcifications measuring approximately 4.9 x 2.4 cm, unchanged, and most compatible with a chronic hematoma. Musculoskeletal: Chronic changes of renal osteodystrophy. Chronic erosive changes centered at the L4-5 disc, without evidence of progression from prior. Unchanged grade 2 anterolisthesis of L4 on L5. No new or acute bony findings. IMPRESSION: 1. Acute sigmoid diverticulitis with a few adjacent locules of extraluminal air compatible with microperforation. No organized fluid collection or abscess. Surgical consultation recommended. 2. Multiple thickened appearing loops of large and small bowel within the abdomen, likely a  reactive enterocolitis. 3. Small volume ascites. 4. 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 5. Chronic changes of renal osteodystrophy. Chronic erosive changes centered at the L4-5 disc, without evidence of progression from prior study. Aortic Atherosclerosis (ICD10-I70.0). These results were called by telephone at the time of interpretation on 08/06/2021 at 3:09 pm to provider Scarlett Presto, MD, who verbally acknowledged these results. Electronically Signed   By: Davina Poke D.O.   On: 08/06/2021 15:12   DG Chest Portable 1 View  Result Date: 08/06/2021 CLINICAL DATA:  abdominal pain EXAM: PORTABLE CHEST 1 VIEW COMPARISON:  07/27/2021. FINDINGS: Similar enlargement the cardiac silhouette. Pulmonary vascular congestion. Calcific atherosclerosis of the aorta. Left basilar opacities. No visible pleural effusions or pneumothorax. Right upper quadrant clips. Partially imaged cervical ACDF. IMPRESSION: 1. Cardiomegaly and pulmonary vascular congestion. 2. Left basilar opacities, which could represent atelectasis, aspiration, and/or pneumonia. Electronically Signed   By: Margaretha Sheffield M.D.   On: 08/06/2021 13:41    Microbiology: Recent Results (from the past 240 hour(s))  SARS CORONAVIRUS 2 (TAT 6-24 HRS) Nasopharyngeal Nasopharyngeal Swab     Status: None   Collection Time: 08/17/21 12:35 PM   Specimen: Nasopharyngeal Swab  Result Value Ref Range Status   SARS Coronavirus 2 NEGATIVE NEGATIVE Final    Comment: (NOTE) SARS-CoV-2 target nucleic acids are NOT DETECTED.  The SARS-CoV-2 RNA is generally detectable in upper and lower respiratory specimens during the acute phase of infection. Negative results do not preclude SARS-CoV-2 infection, do not rule out co-infections with other pathogens, and should not be used as the sole basis for treatment or other patient management decisions. Negative results must be combined  with  clinical observations, patient history, and epidemiological information. The expected result is Negative.  Fact Sheet for Patients: SugarRoll.be  Fact Sheet for Healthcare Providers: https://www.woods-mathews.com/  This test is not yet approved or cleared by the [place] FDA and  has been authorized for detection and/or diagnosis of SARS-CoV-2 by FDA under an Emergency Use Authorization (EUA). This EUA will remain  in effect (meaning this test can be used) for the duration of the COVID-19 declaration under Se ction 564(b)(1) of the Act, 21 U.S.C. section 360bbb-3(b)(1), unless the authorization is terminated or revoked sooner.  Performed at [facility], [place] [address], [place], [place] [address]      Labs: Basic Metabolic Panel: Recent Labs  Lab 08/12/21 0111 08/13/21 0031 08/14/21 0352 08/15/21 0100 08/18/21 0053  NA 133* 131* 133* 131* 132*  K 4.1 4.4 2.9* 3.6 6.1*  CL 95* 92* 95* 97* 100  CO2 21* 20* 24 21* 19*  GLUCOSE 81 103* 115* 105* 122*  BUN 20 30* 11 17 47*  CREATININE 4.40* 5.65* 3.34* 4.59* 6.53*  CALCIUM 7.9* 8.1* 8.3* 7.9* 9.0  MG  --  2.3  --   --   --   PHOS  --  4.5  --  2.2*  --    Liver Function Tests: Recent Labs  Lab 08/15/21 0100  ALBUMIN 2.3*   No results for input(s): LIPASE, AMYLASE in the last 168 hours. No results for input(s): AMMONIA in the last 168 hours. CBC: Recent Labs  Lab 08/12/21 0111 08/13/21 0031 08/14/21 0352 08/15/21 0100 08/18/21 0813  WBC 9.3 11.4* 9.2 8.6 10.4  HGB 10.8* 10.5* 10.6* 10.4* 11.0*  HCT 32.8* 32.6* 31.3* 31.0* 34.5*  MCV 92.4 91.6 88.9 89.6 92.2  PLT 245 292 283 273 296   Cardiac Enzymes: No results for input(s): CKTOTAL, CKMB, CKMBINDEX, TROPONINI in the last 168 hours. BNP: BNP (last 3 results) Recent Labs    04/06/21 1209 05/18/21 0653  BNP 3,215.6* 2,937.5*    ProBNP (last 3 results) No results for input(s): PROBNP in the last  8760 hours.  CBG: No results for input(s): GLUCAP in the last 168 hours.     Signed:  Nita Sells MD   [facility] 08/18/2021, 11:35 AM

## 2021-08-18 NOTE — Progress Notes (Signed)
[facility] Progress Note   Subjective:    Seen and examined during HD-tolerating well so far. No complaints.   Objective Vitals:   08/18/21 0930 08/18/21 1000 08/18/21 1030 08/18/21 1100  BP: 98/83 (!) 74/25 (!) 101/39 (!) 123/29  Pulse: 93 (!) 55 (!) 104 (!) 102  Resp:      Temp:      TempSrc:      SpO2:      Weight:       Physical Exam General: Frail woman; NAD Heart: S1 and S2; No murmurs, gallops, or rubs Lungs: Clear throughout Abdomen: Soft and Non-tender Extremities: No edema BLLE Dialysis Access: R AVG (+) bruit/thrill   Filed Weights   08/15/21 0730 08/15/21 1115 08/18/21 0801  Weight: 49.9 kg 47.9 kg 48.2 kg    Intake/Output Summary (Last 24 hours) at 08/18/2021 1208 Last data filed at 08/18/2021 0200 Gross per 24 hour  Intake --  Output 1 ml  Net -1 ml    Additional Objective Labs: Basic Metabolic Panel: Recent Labs  Lab 08/13/21 0031 08/14/21 0352 08/15/21 0100 08/18/21 0053  NA 131* 133* 131* 132*  K 4.4 2.9* 3.6 6.1*  CL 92* 95* 97* 100  CO2 20* 24 21* 19*  GLUCOSE 103* 115* 105* 122*  BUN 30* 11 17 47*  CREATININE 5.65* 3.34* 4.59* 6.53*  CALCIUM 8.1* 8.3* 7.9* 9.0  PHOS 4.5  --  2.2*  --    Liver Function Tests: Recent Labs  Lab 08/15/21 0100  ALBUMIN 2.3*   No results for input(s): LIPASE, AMYLASE in the last 168 hours. CBC: Recent Labs  Lab 08/12/21 0111 08/13/21 0031 08/14/21 0352 08/15/21 0100 08/18/21 0813  WBC 9.3 11.4* 9.2 8.6 10.4  HGB 10.8* 10.5* 10.6* 10.4* 11.0*  HCT 32.8* 32.6* 31.3* 31.0* 34.5*  MCV 92.4 91.6 88.9 89.6 92.2  PLT 245 292 283 273 296   Blood Culture    Component Value Date/Time   SDES BLOOD RIGHT HAND 11/19/2015 0937   SPECREQUEST  11/19/2015 0937    BOTTLES DRAWN AEROBIC AND ANAEROBIC 10CCS BLUE 5CCS RED   CULT NO GROWTH 5 DAYS 11/19/2015 0937   REPTSTATUS 11/24/2015 FINAL 11/19/2015 0937    Cardiac Enzymes: No results for input(s): CKTOTAL, CKMB, CKMBINDEX, TROPONINI in  the last 168 hours. CBG: No results for input(s): GLUCAP in the last 168 hours. Iron Studies: No results for input(s): IRON, TIBC, TRANSFERRIN, FERRITIN in the last 72 hours. Lab Results  Component Value Date   INR 1.2 02/21/2021   INR 1.1 03/14/2020   INR 1.10 02/23/2017   Studies/Results: No results found.  Medications:   Chlorhexidine Gluconate Cloth  6 each Topical Q0600   cinacalcet  30 mg Oral Once per day on Mon Wed Fri   doxercalciferol  4 mcg Intravenous Q M,W,F-HD   feeding supplement (NEPRO CARB STEADY)  237 mL Oral BID BM   ferric citrate  420 mg Oral TID WC   heparin  5,000 Units Subcutaneous Q8H   pantoprazole  40 mg Oral Daily   sodium chloride flush  3 mL Intravenous Q12H   zolpidem  5 mg Oral QHS    Dialysis Orders: MWF at [facility] 4 hrs 350/600, EDW 49.5kg, 2K/2Ca, RUE AVG, no heparin - No ESA, Hgb > 11 as OP - Sensipar 30 mg PO TIW - Hectorol 4 mcg IV TIW  Assessment/Plan: Sigmoid diverticulitis with microperforation: Surgery following. Prev on IV Zosyn -> now finishing course of PO Augmentin.  ESRD:  Continue HD per usual MWF schedule - HD today Hypertension/volume: No edema on exam, BP controlled. 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  Metabolic bone disease: CorrCa ok, Phos low.  Binders on hold, restarted Hectoral and adjusted sensipar to MWF dosing. Hx CVA-on statin and aspirin Hx anxiety/depression-per admit on Xanax and BuSpar  History of CAD  Dispo: Possibly SNF  Kathryn Poet, NP [facility] 08/18/2021,12:08 PM  LOS: 12 days

## 2021-08-19 NOTE — Progress Notes (Signed)
[facility] Progress Note   Subjective:    Seen and examined patient at beside. No complaints at this time. Unable to reach [facility] during yesterday's HD d/t episodes of hypotension. Awaiting SNF placement-plan for HD 9/7 if patient is still here.  Objective Vitals:   08/18/21 1624 08/18/21 2011 08/19/21 0401 08/19/21 0815  BP: (!) 94/57 (!) 86/46 (!) 85/74 (!) 100/44  Pulse: 95 88 90 78  Resp: 18 18 18 14   Temp: 98.5 F (36.9 C) 98.5 F (36.9 C) 98.2 F (36.8 C) 97.9 F (36.6 C)  TempSrc: Oral Oral Oral Oral  SpO2: 94% 98% 100% 94%  Weight:       Physical Exam General: Frail woman; NAD Heart: S1 and S2; No murmurs, gallops, or rubs Lungs: Clear throughout Abdomen: Soft and Non-tender Extremities: No edema BLLE Dialysis Access: R AVG (+) bruit/thrill   Filed Weights   08/15/21 1115 08/18/21 0801 08/18/21 1130  Weight: 47.9 kg 48.2 kg 48 kg    Intake/Output Summary (Last 24 hours) at 08/19/2021 1015 Last data filed at 08/18/2021 1130 Gross per 24 hour  Intake --  Output -136 ml  Net 136 ml    Additional Objective Labs: Basic Metabolic Panel: Recent Labs  Lab 08/13/21 0031 08/14/21 0352 08/15/21 0100 08/18/21 0053  NA 131* 133* 131* 132*  K 4.4 2.9* 3.6 6.1*  CL 92* 95* 97* 100  CO2 20* 24 21* 19*  GLUCOSE 103* 115* 105* 122*  BUN 30* 11 17 47*  CREATININE 5.65* 3.34* 4.59* 6.53*  CALCIUM 8.1* 8.3* 7.9* 9.0  PHOS 4.5  --  2.2*  --    Liver Function Tests: Recent Labs  Lab 08/15/21 0100  ALBUMIN 2.3*   No results for input(s): LIPASE, AMYLASE in the last 168 hours. CBC: Recent Labs  Lab 08/13/21 0031 08/14/21 0352 08/15/21 0100 08/18/21 0813  WBC 11.4* 9.2 8.6 10.4  HGB 10.5* 10.6* 10.4* 11.0*  HCT 32.6* 31.3* 31.0* 34.5*  MCV 91.6 88.9 89.6 92.2  PLT 292 283 273 296   Blood Culture    Component Value Date/Time   SDES BLOOD RIGHT HAND 11/19/2015 0937   SPECREQUEST  11/19/2015 0937    BOTTLES DRAWN AEROBIC AND ANAEROBIC 10CCS  BLUE 5CCS RED   CULT NO GROWTH 5 DAYS 11/19/2015 0937   REPTSTATUS 11/24/2015 FINAL 11/19/2015 0937    Cardiac Enzymes: No results for input(s): CKTOTAL, CKMB, CKMBINDEX, TROPONINI in the last 168 hours. CBG: No results for input(s): GLUCAP in the last 168 hours. Iron Studies: No results for input(s): IRON, TIBC, TRANSFERRIN, FERRITIN in the last 72 hours. Lab Results  Component Value Date   INR 1.2 02/21/2021   INR 1.1 03/14/2020   INR 1.10 02/23/2017   Studies/Results: No results found.  Medications:   Chlorhexidine Gluconate Cloth  6 each Topical Q0600   cinacalcet  30 mg Oral Once per day on Mon Wed Fri   doxercalciferol  4 mcg Intravenous Q M,W,F-HD   feeding supplement (NEPRO CARB STEADY)  237 mL Oral BID BM   ferric citrate  420 mg Oral TID WC   heparin  5,000 Units Subcutaneous Q8H   pantoprazole  40 mg Oral Daily   sodium chloride flush  3 mL Intravenous Q12H   zolpidem  5 mg Oral QHS    Dialysis Orders[facility] hrs 350/600, EDW 49.5kg, 2K/2Ca, RUE AVG, no heparin - No ESA, Hgb > 11 as OP - Sensipar 30 mg PO TIW - Hectorol  4 mcg IV TIW  Assessment/Plan: Sigmoid diverticulitis with microperforation: Surgery following. Prev on IV Zosyn -> now finishing course of PO Augmentin.  ESRD: Continue HD per usual MWF schedule - unable to reach [facility] d/t episodes of hypotension yesterday, not on anti-hypertensives here. Monitor trends closely. May need to initiate Midodrine PRN with HD. Hypertension/volume: No edema on exam. 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  Metabolic bone disease: CorrCa ok, Phos low.  Binders on hold, restarted Hectoral and adjusted sensipar to MWF dosing. Hx CVA-on statin and aspirin Hx anxiety/depression-per admit on Xanax and BuSpar  History of CAD  Dispo: Awaiting SNF placement  Tobie Poet, NP [place] 08/19/2021,10:15 AM  LOS: 13  days

## 2021-08-19 NOTE — Discharge Summary (Signed)
Physician Discharge Summary  [name] OZH:[id number] DOB: January 04, 1951 DOA: 08/06/2021  PCP: [name], MD  Patient reviewed -well coherent in nad No changes to d/c summary from 9/5--is stable for d/c to SNF when bed available  Admit date: 08/06/2021 Discharge date: 08/19/2021  Time spent: 37 minutes  Recommendations for Outpatient Follow-up:  Requires adjustment of her EDW in outpatient setting as some hypotension with dialysis Get screening labs with CBC Chem-12 phosphorus in about 1 week at facility--was hyperkalemic on discharge possibly secondary to replacement Recommend outpatient consideration for colonoscopy Please consider outpatient lateral pelvis hematoma follow-up with scan if becomes symptomatic  Discharge Diagnoses:  MAIN problem for hospitalization   sigmoid diverticulitis with microperforation on admission without evidence of sepsis  Please see below for itemized issues addressed in [place]- refer to other progress notes for clarity if needed  Discharge Condition: Improved  Diet recommendation: Renal  Filed Weights   08/15/21 1115 08/18/21 0801 08/18/21 1130  Weight: 47.9 kg 48.2 kg 48 kg    History of present illness:  70 year old black female community dwelling 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 4.9X 2.4 cm lateral pelvis chronic hematoma   Renal consulted and continues to follow   General surgery consulted, recommended conservative management-labs normalized started on sips of clears   General surgery signed off antibiotics were  narrowed She is currently awaiting skilled placement    Hospital Course:  Acute sigmoid diverticulitis + microperforation Zosyn-->Augmentin 9/2 and discontinued antibiotics on 9/4 continue regular diet, no concern for abscess surgery has signed off Tylenol to be used for pain and try to avoid medications that have potential for encephalopathy especially as she is a renal placement Will need skilled facility placement when bed is available ESRD MWF/MGUS/renal transplant 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 fib prior VT DCCV 04/02/2020--not on anticoagulation secondary to bleeding Seemed rate controlled without intervention Outpatient monitoring Severe hypokalemia Resolved during hospitalization and was actually slightly hyperkalemic on discharge so this will be adjusted in the outpatient setting Anxiety Continue Xanax 0.25 daily as needed anxiety 5 x 3 cm lateral pelvic chronic hematoma Coincidently found--suppose this can be imaged in the outpatient setting not emergently  Consultations: Renal General surgery  Discharge Exam: Vitals:   08/19/21 0401 08/19/21 0815  BP: (!) 85/74 (!) 100/44  Pulse: 90 78  Resp: 18 14  Temp: 98.2 F (36.8 C) 97.9 F (36.6 C)  SpO2: 100% 94%    Subj on day of d/c   Awake coherent no distress seen on HD unit  eating drinking  No cough no wheeze no nausea no vomiting  General Exam on discharge  EOMI NCAT no icterus no pallor no focal deficit no rales no rhonchi ROM intact moving 4 limbs equally Abdomen soft nontender no rebound no guarding No lower extremity edema Fistula on right side is being used, AV graft/fistula on the left side good thrill  Discharge Instructions   Discharge Instructions     Diet  - low sodium heart healthy   Complete by: As directed    Increase activity slowly   Complete by: As directed       Allergies as of 08/19/2021       Reactions   Sulfa Antibiotics Other (See Comments)   Per patient, both parents allergic-so will not take   Adhesive [tape] Itching        Medication List     STOP taking these medications    aspirin 81 MG EC tablet   losartan 50 MG tablet Commonly known as: COZAAR   sucralfate 1 g tablet Commonly known as: Carafate       TAKE these medications    acetaminophen 500 MG tablet Commonly known as: TYLENOL Take 1 tablet (500 mg total) by mouth every 6 (six) hours as needed. What changed: reasons to take this   albuterol 108 (90 Base) MCG/ACT inhaler Commonly known as: VENTOLIN HFA Inhale 2 puffs into the lungs every 6 (six) hours as needed for wheezing or shortness of breath.   ALLOPURINOL PO Take 1 tablet by mouth daily as needed (for gout flares).   ALPRAZolam 0.25 MG tablet Commonly known as: XANAX Take 1 tablet (0.25 mg total) by mouth daily as needed for anxiety.   Artificial Tears PF 0.1-0.3 % Soln Generic drug: Dextran 70-Hypromellose (PF) Place 1 drop into both eyes 3 (three) times daily as needed (for dryness).   atorvastatin 80 MG tablet Commonly known as: LIPITOR TAKE 1 TABLET (80 MG TOTAL) BY MOUTH DAILY.   Auryxia 1 GM 210 MG(Fe) tablet Generic drug: ferric citrate Take 420 mg by mouth 3 (three) times daily with meals.   busPIRone 10 MG tablet Commonly known as: BUSPAR TAKE 1 TABLET (10 MG TOTAL) BY MOUTH TWO TIMES DAILY. What changed:  how much to take how to take this when to take this reasons to take this   cinacalcet 30 MG tablet Commonly known as: SENSIPAR Take 30 mg by mouth daily.   lactulose 10 GM/15ML solution Commonly known as: CHRONULAC Take 20 g by mouth daily as needed for mild constipation.   ondansetron 8 MG tablet Commonly known as: ZOFRAN Take 8 mg by mouth 2 (two)  times daily as needed for nausea or vomiting.   pantoprazole 40 MG tablet Commonly known as: PROTONIX Take 1 tablet (40 mg total) by mouth daily.   zolpidem 10 MG tablet Commonly known as: AMBIEN Take 10 mg by mouth at bedtime.       Allergies  Allergen Reactions   Sulfa Antibiotics Other (See Comments)    Per patient, both parents allergic-so will not take   Adhesive [Tape] Itching    Follow-up Information     Cornett, Marcello Moores, MD Follow up on 09/09/2021.   Specialty: General Surgery Why: 1040am. Please arrive 30 minutes prior to your appointment for paperwork. Please bring a copy of your photo ID and insurance card. Contact information: [address] [place] [address] [phone]         [name], MD Follow up.   Specialty: Internal Medicine Contact information: [address] [place] [address] [phone]                  The results of significant diagnostics from this hospitalization (including imaging, microbiology, ancillary and laboratory) are listed below for reference.    Significant Diagnostic Studies: DG Ribs Unilateral  W/Chest Right  Result Date: 07/27/2021 CLINICAL DATA:  Rib pain. EXAM: RIGHT RIBS AND CHEST - 3+ VIEW COMPARISON:  July 07, 2021 FINDINGS: No pneumothorax. Stable cardiomegaly. The hila and mediastinum are unchanged. No acute infiltrate. Kerley B lines and mild prominence of the interstitium identified. No rib fractures are noted. IMPRESSION: 1. No rib fractures or pneumothorax. 2. Cardiomegaly and mild pulmonary edema. 3. No other changes. Electronically Signed   By: Dorise Bullion III M.D.   On: 07/27/2021 10:00   CT ABDOMEN PELVIS W CONTRAST  Result Date: 08/10/2021 CLINICAL DATA:  Complication of diverticulitis suspected. EXAM: CT ABDOMEN AND PELVIS WITH CONTRAST TECHNIQUE: Multidetector CT imaging of the abdomen and pelvis was performed using the standard protocol following bolus  administration of intravenous contrast. CONTRAST:  25mL OMNIPAQUE IOHEXOL 350 MG/ML SOLN COMPARISON:  CT abdomen dated 08/06/2021. FINDINGS: Lower chest: Patchy chronic micronodularity at the bilateral lung bases, incompletely imaged, not significantly changed for greater than 5 years indicating benignity. Small RIGHT pleural effusion with adjacent atelectasis. Hepatobiliary: No focal liver abnormality is seen. Status post cholecystectomy. Pancreas: Unremarkable. No pancreatic ductal dilatation or surrounding inflammatory changes. Spleen: Normal in size without focal abnormality. Adrenals/Urinary Tract: Atrophic kidneys. LEFT renal cyst. No acute findings. Bladder is decompressed. Stomach/Bowel: Abnormal-appearing small bowel within the LEFT abdomen (proximal jejunum), with amorphous/thickened walls and surrounding mesenteric inflammation. There is new free intraperitoneal air suggesting bowel perforation, likely source being this abnormal-appearing small bowel in the LEFT abdomen, suspect bowel ischemia. Previous study showed abnormal appearing sigmoid/descending colon suggesting acute diverticulitis. This is not as clearly delineated on today's exam. The free air may, however, be the result of a ruptured diverticulitis and the abnormal-appearing small bowel in the LEFT abdomen may be reactive in nature. No extravasated oral contrast to confirm small-bowel perforation. No dilated large or small bowel loops are identified elsewhere in the abdomen and pelvis Vascular/Lymphatic: Aortic atherosclerosis. Heavy atherosclerotic changes of the aortic branch vessels. No acute-appearing vascular abnormality. 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 No enlarged lymph nodes are seen. Reproductive: Uterus and bilateral adnexa are unremarkable. Other: Small amount of free fluid within the abdomen and pelvis. No circumscribed fluid collection, however, early abscess collection in the LEFT abdomen is  difficult to exclude. Musculoskeletal: No acute-appearing osseous abnormality. 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 IMPRESSION: 1. New free intraperitoneal air indicating bowel perforation. 2. Abnormal-appearing small bowel within the LEFT abdomen (proximal jejunum), with amorphous/thickened walls and surrounding mesenteric inflammation. Findings are suspicious for small bowel perforation at this level, possibly indicating small bowel ischemia. Alternatively, this may represent reactive bowel wall thickening (see below). 3. Previous study showed abnormal appearing sigmoid/descending colon suggesting acute diverticulitis. This is not as clearly delineated on today's exam. The free air, however, may be the result of a ruptured diverticulitis with the abnormal-appearing small bowel in the LEFT abdomen being reactive in nature. 4. Scattered free fluid within the abdomen and pelvis. No circumscribed fluid collection, however, early abscess collection in the LEFT abdomen is difficult to exclude. 5. Small RIGHT pleural effusion with adjacent atelectasis. 6. 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 Aortic Atherosclerosis (ICD10-I70.0). Critical Value/emergent results were called by telephone at the time of interpretation on 08/10/2021 at 4:58 pm to provider Dr. Cathlean Sauer, who verbally acknowledged these results. Electronically Signed   By: Franki Cabot M.D.   On: 08/10/2021 17:00   CT ABDOMEN PELVIS W CONTRAST  Result Date: 08/06/2021  CLINICAL DATA:  Nausea/vomiting Abdominal pain, acute, nonlocalized EXAM: CT ABDOMEN AND PELVIS WITH CONTRAST TECHNIQUE: Multidetector CT imaging of the abdomen and pelvis was performed using the standard protocol following bolus administration of intravenous contrast. CONTRAST:  132mL OMNIPAQUE IOHEXOL 350 MG/ML SOLN COMPARISON:  07/21/2020 FINDINGS: Lower chest: Dependent bibasilar atelectasis. Cardiomegaly.  Coronary artery calcification. Hepatobiliary: No focal liver abnormality is seen. Status post cholecystectomy. No biliary dilatation. Pancreas: Unremarkable. No pancreatic ductal dilatation or surrounding inflammatory changes. Spleen: Normal in size without focal abnormality. Adrenals/Urinary Tract: Unremarkable adrenal glands. Advanced bilateral renal atrophy. Left renal cyst has slightly increased in size from prior now measuring 6.4 cm (previously 5.6 cm). No hydronephrosis. Urinary bladder is decompressed, limiting its evaluation. Stomach/Bowel: Stomach is unremarkable. No dilated loops of bowel. There is a focally thickened short segment of sigmoid colon which contains multiple diverticula. Adjacent pericolonic fat stranding, free fluid, and numerous foci of extraluminal air (series 3, images 38-47). No organized fluid collection or abscess. There is mild long segment thickening throughout the remaining colon. There are multiple thickened appearing loops of small bowel within the abdomen, likely a reactive enteritis. Vascular/Lymphatic: Advanced atherosclerotic calcifications throughout the aortoiliac axis. 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 No lymphadenopathy. Reproductive: Uterus and bilateral adnexa are unremarkable. Other: Small volume ascites. Chronic soft tissue density in the anterolateral pelvis with scattered internal calcifications measuring approximately 4.9 x 2.4 cm, unchanged, and most compatible with a chronic hematoma. Musculoskeletal: Chronic changes of renal osteodystrophy. Chronic erosive changes centered at the L4-5 disc, without evidence of progression from prior. Unchanged grade 2 anterolisthesis of L4 on L5. No new or acute bony findings. IMPRESSION: 1. Acute sigmoid diverticulitis with a few adjacent locules of extraluminal air compatible with microperforation. No organized fluid collection or  abscess. Surgical consultation recommended. 2. Multiple thickened appearing loops of large and small bowel within the abdomen, likely a reactive enterocolitis. 3. Small volume ascites. 4. 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 5. Chronic changes of renal osteodystrophy. Chronic erosive changes centered at the L4-5 disc, without evidence of progression from prior study. Aortic Atherosclerosis (ICD10-I70.0). These results were called by telephone at the time of interpretation on 08/06/2021 at 3:09 pm to provider Scarlett Presto, MD, who verbally acknowledged these results. Electronically Signed   By: Davina Poke D.O.   On: 08/06/2021 15:12   DG Chest Portable 1 View  Result Date: 08/06/2021 CLINICAL DATA:  abdominal pain EXAM: PORTABLE CHEST 1 VIEW COMPARISON:  07/27/2021. FINDINGS: Similar enlargement the cardiac silhouette. Pulmonary vascular congestion. Calcific atherosclerosis of the aorta. Left basilar opacities. No visible pleural effusions or pneumothorax. Right upper quadrant clips. Partially imaged cervical ACDF. IMPRESSION: 1. Cardiomegaly and pulmonary vascular congestion. 2. Left basilar opacities, which could represent atelectasis, aspiration, and/or pneumonia. Electronically Signed   By: Margaretha Sheffield M.D.   On: 08/06/2021 13:41    Microbiology: Recent Results (from the past 240 hour(s))  SARS CORONAVIRUS 2 (TAT 6-24 HRS) Nasopharyngeal Nasopharyngeal Swab     Status: None   Collection Time: 08/17/21 12:35 PM   Specimen: Nasopharyngeal Swab  Result Value Ref Range Status   SARS Coronavirus 2 NEGATIVE NEGATIVE Final    Comment: (NOTE) SARS-CoV-2 target nucleic acids are NOT DETECTED.  The SARS-CoV-2 RNA is generally detectable in upper and lower respiratory specimens during the acute phase of infection. Negative results do not preclude SARS-CoV-2 infection, do not rule  out co-infections with other pathogens,  and should not be used as the sole basis for treatment or other patient management decisions. Negative results must be combined with clinical observations, patient history, and epidemiological information. The expected result is Negative.  Fact Sheet for Patients: SugarRoll.be  Fact Sheet for Healthcare Providers: https://www.woods-mathews.com/  This test is not yet approved or cleared by the [place] FDA and  has been authorized for detection and/or diagnosis of SARS-CoV-2 by FDA under an Emergency Use Authorization (EUA). This EUA will remain  in effect (meaning this test can be used) for the duration of the COVID-19 declaration under Se ction 564(b)(1) of the Act, 21 U.S.C. section 360bbb-3(b)(1), unless the authorization is terminated or revoked sooner.  Performed at [facility], [place] [address], [place], [place] [address]      Labs: Basic Metabolic Panel: Recent Labs  Lab 08/13/21 0031 08/14/21 0352 08/15/21 0100 08/18/21 0053  NA 131* 133* 131* 132*  K 4.4 2.9* 3.6 6.1*  CL 92* 95* 97* 100  CO2 20* 24 21* 19*  GLUCOSE 103* 115* 105* 122*  BUN 30* 11 17 47*  CREATININE 5.65* 3.34* 4.59* 6.53*  CALCIUM 8.1* 8.3* 7.9* 9.0  MG 2.3  --   --   --   PHOS 4.5  --  2.2*  --     Liver Function Tests: Recent Labs  Lab 08/15/21 0100  ALBUMIN 2.3*    No results for input(s): LIPASE, AMYLASE in the last 168 hours. No results for input(s): AMMONIA in the last 168 hours. CBC: Recent Labs  Lab 08/13/21 0031 08/14/21 0352 08/15/21 0100 08/18/21 0813  WBC 11.4* 9.2 8.6 10.4  HGB 10.5* 10.6* 10.4* 11.0*  HCT 32.6* 31.3* 31.0* 34.5*  MCV 91.6 88.9 89.6 92.2  PLT 292 283 273 296    Cardiac Enzymes: No results for input(s): CKTOTAL, CKMB, CKMBINDEX, TROPONINI in the last 168 hours. BNP: BNP (last 3 results) Recent Labs    04/06/21 1209 05/18/21 0653  BNP 3,215.6* 2,937.5*     ProBNP (last 3 results) No  results for input(s): PROBNP in the last 8760 hours.  CBG: No results for input(s): GLUCAP in the last 168 hours.     Signed:  Nita Sells MD   [facility] 08/19/2021, 9:20 AM

## 2021-08-19 NOTE — Plan of Care (Signed)
  Problem: Education: Goal: Knowledge of General Education information will improve Description Including pain rating scale, medication(s)/side effects and non-pharmacologic comfort measures Outcome: Progressing   

## 2021-08-19 NOTE — TOC Progression Note (Signed)
Transition of Care [facility]) - Progression Note    Patient Details  Name: NECOLA BLUESTEIN MRN: [id number] Date of Birth: 1951/06/21  Transition of Care [facility]) CM/SW [place], LCSW Phone Number: 08/19/2021, 9:45 AM  Clinical Narrative:    Patient's only SNF offer is [facility]. CSW attempted to initiate insurance authorization through the [employer] portal but was notified that patient is not managed by [employer]. CSW requested [facility] begin insurance process with [employer] COVID test done 9/4.   Expected Discharge Plan: Skilled Nursing Facility Barriers to Discharge: Insurance Authorization  Expected Discharge Plan and Services Expected Discharge Plan: [place] In-house Referral: Clinical Social Work   Post Acute Care Choice: [place], Dialysis Living arrangements for the past 2 months: [place] Expected Discharge Date: 08/18/21                                     Social Determinants of Health (SDOH) Interventions    Readmission Risk Interventions Readmission Risk Prevention Plan 08/18/2021  Transportation Screening Complete  Medication Review Press photographer) Complete  PCP or Specialist appointment within 3-5 days of discharge Complete  HRI or Home Care Consult Complete  SW Recovery Care/Counseling Consult Complete  Palliative Care Screening Not [place] Complete  Some recent data might be hidden

## 2021-08-19 NOTE — Progress Notes (Signed)
   08/19/21 1826  What Happened  Was fall witnessed? No  Was patient injured? No  Patient found on floor  Found by Staff-comment (Nora-Nurse Tech)  Stated prior activity other (comment) (phone fell oout of hand-tried to grab)  Follow Up  MD notified Verlon Au, MD  Time MD notified 32  Family notified No - patient refusal  Time family notified  (patient said she would call them later)  Additional tests No  Simple treatment Other (comment) (none needed-no injures)  Progress note created (see row info) Yes  Adult Fall Risk Assessment  Risk Factor Category (scoring not indicated) Not Applicable  Age 70  Fall History: Fall within 6 months prior to admission 0  Elimination; Bowel and/or Urine Incontinence 2  Elimination; Bowel and/or Urine Urgency/Frequency 2  Medications: includes PCA/Opiates, Anti-convulsants, Anti-hypertensives, Diuretics, Hypnotics, Laxatives, Sedatives, and Psychotropics 3  Patient Care Equipment 1  Mobility-Assistance 2  Mobility-Gait 2  Mobility-Sensory Deficit 0  Altered awareness of immediate physical environment 0  Impulsiveness 0  Lack of understanding of one's physical/cognitive limitations 0  Total Score 14  Patient Fall Risk Level High fall risk  Adult Fall Risk Interventions  Required Bundle Interventions *See Row Information* High fall risk - low, moderate, and high requirements implemented  Additional Interventions Use of appropriate toileting equipment (bedpan, BSC, etc.)  Screening for Fall Injury Risk (To be completed on HIGH fall risk patients) - Assessing Need for Floor Mats  Risk For Fall Injury- Criteria for Floor Mats Previous fall this admission  Will Implement Floor Mats Yes (will place for this fall)  Vitals  BP 110/75  BP Location Right Arm  BP Method Automatic  Patient Position (if appropriate) Sitting  Pulse Rate 94  Pulse Rate Source Dinamap  Resp 16  Oxygen Therapy  SpO2 95 %  Pain Assessment  Pain Score 0  PAINAD (Pain  Assessment in Advanced Dementia)  Breathing 0  Negative Vocalization 0  Facial Expression 0  Body Language 0  Consolability 0  PAINAD Score 0  PCA/Epidural/Spinal Assessment  Respiratory Pattern Regular;Unlabored  Neurological  Neuro (WDL) WDL  Level of Consciousness Alert  Orientation Level Oriented X4  Glasgow Coma Scale  Eye Opening 4  Best Verbal Response (NON-intubated) 5  Best Motor Response 6  Glasgow Coma Scale Score 15  Musculoskeletal  Musculoskeletal (WDL) X  Assistive Device None  Generalized Weakness Yes  Weight Bearing Restrictions No  Integumentary  Integumentary (WDL) WDL  Skin Color Appropriate for ethnicity  Skin Condition Dry  Skin Integrity Other (Comment) (healed scab on top of left foot)  Skin Turgor Non-tenting

## 2021-08-20 LAB — RENAL FUNCTION PANEL
Albumin: 2.2 g/dL — ABNORMAL LOW (ref 3.5–5.0)
Anion gap: 13 (ref 5–15)
BUN: 46 mg/dL — ABNORMAL HIGH (ref 8–23)
CO2: 24 mmol/L (ref 22–32)
Calcium: 8.8 mg/dL — ABNORMAL LOW (ref 8.9–10.3)
Chloride: 94 mmol/L — ABNORMAL LOW (ref 98–111)
Creatinine, Ser: 5.74 mg/dL — ABNORMAL HIGH (ref 0.44–1.00)
GFR, Estimated: 7 mL/min — ABNORMAL LOW (ref >60)
Glucose, Bld: 118 mg/dL — ABNORMAL HIGH (ref 70–99)
Phosphorus: 2 mg/dL — ABNORMAL LOW (ref 2.5–4.6)
Potassium: 3.5 mmol/L (ref 3.5–5.1)
Sodium: 131 mmol/L — ABNORMAL LOW (ref 135–145)

## 2021-08-20 LAB — CBC
HCT: 29 % — ABNORMAL LOW (ref 36.0–46.0)
Hemoglobin: 9.9 g/dL — ABNORMAL LOW (ref 12.0–15.0)
MCH: 30.6 pg (ref 26.0–34.0)
MCHC: 34.1 g/dL (ref 30.0–36.0)
MCV: 89.5 fL (ref 80.0–100.0)
Platelets: 293 10*3/uL (ref 150–400)
RBC: 3.24 MIL/uL — ABNORMAL LOW (ref 3.87–5.11)
RDW: 16.2 % — ABNORMAL HIGH (ref 11.5–15.5)
WBC: 8.7 10*3/uL (ref 4.0–10.5)
nRBC: 0 % (ref 0.0–0.2)

## 2021-08-20 LAB — HEPATITIS B CORE ANTIBODY, TOTAL: Hep B Core Total Ab: NONREACTIVE

## 2021-08-20 LAB — HEPATITIS PANEL, ACUTE
HCV Ab: NONREACTIVE
Hep A IgM: NONREACTIVE
Hep B C IgM: NONREACTIVE
Hepatitis B Surface Ag: NONREACTIVE

## 2021-08-20 LAB — HEPATITIS B SURFACE ANTIGEN: Hepatitis B Surface Ag: NONREACTIVE

## 2021-08-20 LAB — HEPATITIS C ANTIBODY: HCV Ab: NONREACTIVE

## 2021-08-20 MED ORDER — SODIUM CHLORIDE 0.9 % IV SOLN: 100.0000 mL | INTRAVENOUS | Status: DC | PRN

## 2021-08-20 MED ORDER — SODIUM CHLORIDE 0.9 % IV SOLN
100.0000 mL | INTRAVENOUS | Status: DC | PRN
Start: 2021-08-20 — End: 2021-08-20

## 2021-08-20 MED ORDER — HEPARIN SODIUM (PORCINE) 1000 UNIT/ML DIALYSIS: 1000.0000 [IU] | INTRAMUSCULAR | Status: DC | PRN

## 2021-08-20 MED ORDER — LIDOCAINE HCL (PF) 1 % IJ SOLN: 5.0000 mL | INTRAMUSCULAR | Status: DC | PRN

## 2021-08-20 MED ORDER — LIDOCAINE-PRILOCAINE 2.5-2.5 % EX CREA: 1.0000 "application " | TOPICAL_CREAM | CUTANEOUS | Status: DC | PRN

## 2021-08-20 MED ORDER — PENTAFLUOROPROP-TETRAFLUOROETH EX AERO: 1.0000 "application " | INHALATION_SPRAY | CUTANEOUS | Status: DC | PRN

## 2021-08-20 MED ORDER — ALTEPLASE 2 MG IJ SOLR: 2.0000 mg | Freq: Once | INTRAMUSCULAR | Status: DC | PRN

## 2021-08-20 MED ORDER — PENTAFLUOROPROP-TETRAFLUOROETH EX AERO
1.0000 | INHALATION_SPRAY | CUTANEOUS | Status: DC | PRN
Start: 2021-08-20 — End: 2021-08-20

## 2021-08-20 MED ORDER — HEPATITIS B VAC RECOMBINANT 10 MCG/0.5ML IJ SUSP
2.0000 mL | Freq: Once | INTRAMUSCULAR | Status: AC
  Administered 2021-08-20: 2 mL via INTRAMUSCULAR
  Filled 2021-08-20: qty 2

## 2021-08-20 NOTE — Progress Notes (Signed)
Hep B vaccine given.  

## 2021-08-20 NOTE — Progress Notes (Signed)
Called report to [facility] [phone] spoke with nurse [facility] pt will be in room 124

## 2021-08-20 NOTE — Progress Notes (Addendum)
[facility] Progress Note   Subjective:    Seen and examined patient during HD. Of note, patient suffered a fall yesterday evening in her room. Currently denies pain and SOB. So far is tolerating UFG 3L.  Objective Vitals:   08/20/21 1030 08/20/21 1100 08/20/21 1130 08/20/21 1200  BP: (!) 117/56 (!) 94/55 110/73 105/70  Pulse: 100 96 98 96  Resp:      Temp:      TempSrc:      SpO2:      Weight:       Physical Exam General: Frail woman; NAD Heart: S1 and S2; No murmurs, gallops, or rubs Lungs: Clear throughout Abdomen: Soft and Non-tender Extremities: No edema BLLE Dialysis Access: R AVG (+) bruit/thrill   Filed Weights   08/18/21 0801 08/18/21 1130 08/20/21 0900  Weight: 48.2 kg 48 kg 49.9 kg   No intake or output data in the 24 hours ending 08/20/21 1353  Additional Objective Labs: Basic Metabolic Panel: Recent Labs  Lab 08/15/21 0100 08/18/21 0053 08/20/21 0800  NA 131* 132* 131*  K 3.6 6.1* 3.5  CL 97* 100 94*  CO2 21* 19* 24  GLUCOSE 105* 122* 118*  BUN 17 47* 46*  CREATININE 4.59* 6.53* 5.74*  CALCIUM 7.9* 9.0 8.8*  PHOS 2.2*  --  2.0*   Liver Function Tests: Recent Labs  Lab 08/15/21 0100 08/20/21 0800  ALBUMIN 2.3* 2.2*   No results for input(s): LIPASE, AMYLASE in the last 168 hours. CBC: Recent Labs  Lab 08/14/21 0352 08/15/21 0100 08/18/21 0813 08/20/21 0948  WBC 9.2 8.6 10.4 8.7  HGB 10.6* 10.4* 11.0* 9.9*  HCT 31.3* 31.0* 34.5* 29.0*  MCV 88.9 89.6 92.2 89.5  PLT 283 273 296 293   Blood Culture    Component Value Date/Time   SDES BLOOD RIGHT HAND 11/19/2015 0937   SPECREQUEST  11/19/2015 0937    BOTTLES DRAWN AEROBIC AND ANAEROBIC 10CCS BLUE 5CCS RED   CULT NO GROWTH 5 DAYS 11/19/2015 0937   REPTSTATUS 11/24/2015 FINAL 11/19/2015 0937    Cardiac Enzymes: No results for input(s): CKTOTAL, CKMB, CKMBINDEX, TROPONINI in the last 168 hours. CBG: No results for input(s): GLUCAP in the last 168 hours. Iron  Studies: No results for input(s): IRON, TIBC, TRANSFERRIN, FERRITIN in the last 72 hours. Lab Results  Component Value Date   INR 1.2 02/21/2021   INR 1.1 03/14/2020   INR 1.10 02/23/2017   Studies/Results: No results found.  Medications:  sodium chloride     sodium chloride      Chlorhexidine Gluconate Cloth  6 each Topical Q0600   cinacalcet  30 mg Oral Once per day on Mon Wed Fri   doxercalciferol  4 mcg Intravenous Q M,W,F-HD   feeding supplement (NEPRO CARB STEADY)  237 mL Oral BID BM   ferric citrate  420 mg Oral TID WC   heparin  5,000 Units Subcutaneous Q8H   pantoprazole  40 mg Oral Daily   sodium chloride flush  3 mL Intravenous Q12H   zolpidem  5 mg Oral QHS    Dialysis Orders: MWF at [facility] 4 hrs 350/600, EDW 49.5kg, 2K/2Ca, RUE AVG, no heparin - No ESA, Hgb > 11 as OP - Sensipar 30 mg PO TIW - Hectorol 4 mcg IV TIW  Assessment/Plan: Sigmoid diverticulitis with microperforation: Surgery following. Prev on IV Zosyn -> PO Augmentin course completed.  ESRD: Continue HD per usual MWF-on HD and currently tolerating, Bps soft but stable. Continue  to monitor trends closely. Hypertension/volume: No edema on exam. Bps currently soft, not on anti-hypertensives here-monitor closely Anemia of ESRD: Hgb dropped now 9.9-monitor closely, may need to start ESA.  Metabolic bone disease: CorrCa ok, Phos low.  Binders on hold, restarted Hectoral and adjusted sensipar to MWF dosing. Hx CVA-on statin and aspirin Hx anxiety/depression-per admit on Xanax and BuSpar  History of CAD  Dispo: Awaiting SNF placement  Tobie Poet, NP [facility] 08/20/2021,1:53 PM  LOS: 14 days

## 2021-08-20 NOTE — Progress Notes (Signed)
OT Cancellation Note  Patient Details Name: [name] MRN: [id number] DOB: 1950-12-20   Cancelled Treatment:    Reason Eval/Treat Not Completed: Patient at procedure or test/ unavailable. Patient at HD. Will f/u as able.  [name] [name] 08/20/2021, 2:25 PM

## 2021-08-20 NOTE — TOC Transition Note (Addendum)
Transition of Care [facility]) - CM/SW Discharge Note   Patient Details  Name: [name] MRN: [id number] Date of Birth: 29-Apr-1951  Transition of Care [facility] CM/SW Contact:  Emeterio Reeve, LCSW Phone Number: 08/20/2021, 1:56 PM   Clinical Narrative:     Patient will DC to: [facility] Anticipated DC date: 08/20/21 Family notified: sister Dance movement psychotherapist by: Corey Harold     Per MD patient ready for DC to [facility]. RN, patient, patient's family, and facility notified of DC. Discharge Summary and FL2 sent to facility. DC packet on chart. Insurance Josem Kaufmann has been received and pt is covid negative. Ambulance transport requested for patient.    Pts HD is MWF at 12pm. Pts sister [name] will update [facility] [place] that pt will require pick up drop off at [facility].   RN to call report to [phone]  CSW will sign off for now as social work intervention is no longer needed. Please consult [place] again if new needs arise.    Final next level of care: Skilled Nursing Facility Barriers to Discharge: Barriers Resolved   Patient Goals and CMS Choice Patient states their goals for this hospitalization and ongoing recovery are:: Rehab CMS Medicare.gov Compare Post Acute Care list provided to:: Patient Choice offered to / list presented to : Patient  Discharge Placement              Patient chooses bed at: [facility] Patient to be transferred to facility by: [facility] Name of family member notified: sister, [name] Patient and family notified of of transfer: 08/20/21  Discharge Plan and Services In-house Referral: Clinical Social Work   Post Acute Care Choice: [place], Dialysis                               Social Determinants of Health ([place]) Interventions     Readmission Risk Interventions Readmission Risk Prevention Plan 08/18/2021  Transportation Screening Complete  Medication Review Press photographer) Complete  PCP or Specialist appointment within 3-5 days of discharge  Complete  HRI or [place] Complete  SW Recovery Care/Counseling Consult Complete  [place] Complete  Some recent data might be hidden    Emeterio Reeve, LCSW Clinical Social Worker

## 2021-08-20 NOTE — Discharge Summary (Signed)
Physician Discharge Summary  [name] EXH:[id number] DOB: [birth year] DOA: 08/06/2021  PCP: [name], MD  Patient reviewed -well coherent in nad No changes to d/c summary from 9/5--is stable for d/c to SNF when bed available  Admit date: 08/06/2021 Discharge date: 08/20/2021  Time spent: 37 minutes  Recommendations for Outpatient Follow-up:  Requires adjustment of her EDW in outpatient setting as some hypotension with dialysis Get screening labs with CBC Chem-12 phosphorus in about 1 week at facility--was hyperkalemic on discharge possibly secondary to replacement Recommend outpatient consideration for colonoscopy Please consider outpatient lateral pelvis hematoma follow-up with scan if becomes symptomatic Needs a repeat Hepatitis panel in 1 week.   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 4.9X 2.4 cm lateral pelvis chronic hematoma   Renal consulted and continues to follow   General surgery consulted, recommended conservative management-labs normalized started on sips of clears    General surgery signed off antibiotics were narrowed She is currently awaiting skilled placement    Hospital Course:  Acute sigmoid diverticulitis + microperforation Zosyn-->Augmentin 9/2 and discontinued antibiotics on 9/4 continue regular diet, no concern for abscess surgery has signed off Tylenol to be used for pain and try to avoid medications that have potential for encephalopathy especially as she is a renal placement Will need skilled facility placement when bed is available ESRD MWF/MGUS/renal transplant 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 fib prior VT DCCV 04/02/2020--not on anticoagulation secondary to bleeding Seemed rate controlled without intervention Outpatient monitoring Severe hypokalemia Resolved during hospitalization and was actually slightly hyperkalemic on discharge so this will be adjusted in the outpatient setting Anxiety Continue Xanax 0.25 daily as needed anxiety 5 x 3 cm lateral pelvic chronic hematoma Coincidently found--suppose this can be imaged in the outpatient setting not emergently Potential Hepatitis exposure  Hepatitis panel negative here, recommended repeating in 1 week  Consultations: Renal General surgery  Discharge Exam: Vitals:   08/20/21 1130 08/20/21 1200  BP: 110/73 105/70  Pulse: 98 96  Resp:    Temp:    SpO2:      Subj on day of d/c   No acute complains.   General Exam on discharge  General: Appear in mild distress, no Rash; Oral Mucosa Clear, moist. no Abnormal Neck Mass Or lumps, Conjunctiva normal  Cardiovascular: S1 and S2 Present, no Murmur, Respiratory: good respiratory effort, Bilateral Air entry present and CTA, no Crackles, no wheezes Abdomen: Bowel Sound present, Soft and no  tenderness Extremities: no Pedal edema Neurology: alert and oriented to place, and person affect appropriate. no new focal deficit Gait not checked due to patient safety concerns     Discharge Instructions   Discharge Instructions     Diet - low sodium heart healthy   Complete by: As directed    Increase activity slowly   Complete by: As directed       Allergies as of 08/20/2021       Reactions   Sulfa Antibiotics Other (See Comments)   Per patient, both parents allergic-so will not take   Adhesive [tape] Itching        Medication List     STOP taking these medications    aspirin 81 MG EC tablet   losartan 50 MG tablet Commonly known as: COZAAR   sucralfate 1 g tablet Commonly known as: Carafate       TAKE these medications    acetaminophen 500 MG tablet Commonly known as: TYLENOL Take 1 tablet (500 mg total) by mouth every 6 (six) hours as needed. What changed: reasons to take this   albuterol 108 (90 Base) MCG/ACT inhaler Commonly known as: VENTOLIN HFA Inhale 2 puffs into the lungs every 6 (six) hours as needed for wheezing or shortness of breath.   ALLOPURINOL PO Take 1 tablet by mouth daily as needed (for gout flares).   ALPRAZolam 0.25 MG tablet Commonly known as: XANAX Take 1 tablet (0.25 mg total) by mouth daily as needed for anxiety.   Artificial Tears PF 0.1-0.3 % Soln Generic drug: Dextran 70-Hypromellose (PF) Place 1 drop into both eyes 3 (three) times daily as needed (for dryness).   atorvastatin 80 MG tablet Commonly known as: LIPITOR TAKE 1 TABLET (80 MG TOTAL) BY MOUTH DAILY.   Auryxia 1 GM 210 MG(Fe) tablet Generic drug: ferric citrate Take 420 mg by mouth 3 (three) times daily with meals.   busPIRone 10 MG tablet Commonly known as: BUSPAR TAKE 1 TABLET (10 MG TOTAL) BY MOUTH TWO TIMES DAILY. What changed:  how much to take how to take this when to take this reasons to take this   cinacalcet 30 MG tablet Commonly  known as: SENSIPAR Take 30 mg by mouth daily.   lactulose 10 GM/15ML solution Commonly known as: CHRONULAC Take 20 g by mouth daily as needed for mild constipation.   ondansetron 8 MG tablet Commonly known as: ZOFRAN Take 8 mg by mouth 2 (two) times daily as needed for nausea or vomiting.   pantoprazole 40 MG tablet Commonly known as: PROTONIX Take 1 tablet (40 mg total) by mouth daily.   zolpidem 10 MG tablet Commonly known as: AMBIEN Take 10 mg by mouth at bedtime.       Allergies  Allergen Reactions   Sulfa Antibiotics Other (See Comments)    Per patient, both parents allergic-so will not take   Adhesive [Tape] Itching    Contact information for follow-up providers     Erroll Luna, MD Follow up on 09/09/2021.   Specialty: General Surgery Why: 1040am. Please arrive 30 minutes prior to your appointment for paperwork. Please bring a copy of your photo ID and insurance card. Contact information: [address] [place] [address] [phone]         [name], MD Follow up.   Specialty: Internal Medicine Why: repeat hepatitis panel in 1 week. Contact information: [address] [place] [address] [phone]  Contact information for after-discharge care     Destination     [facility] Preferred SNF .   Service: Skilled Nursing Contact information: [address] [place] [phone]                      The results of significant diagnostics from this hospitalization (including imaging, microbiology, ancillary and laboratory) are listed below for reference.    Significant Diagnostic Studies: DG Ribs Unilateral W/Chest Right  Result Date: 07/27/2021 CLINICAL DATA:  Rib pain. EXAM: RIGHT RIBS AND CHEST - 3+ VIEW COMPARISON:  July 07, 2021 FINDINGS: No pneumothorax. Stable cardiomegaly. The hila and mediastinum are unchanged. No acute  infiltrate. Kerley B lines and mild prominence of the interstitium identified. No rib fractures are noted. IMPRESSION: 1. No rib fractures or pneumothorax. 2. Cardiomegaly and mild pulmonary edema. 3. No other changes. Electronically Signed   By: Dorise Bullion III M.D.   On: 07/27/2021 10:00   CT ABDOMEN PELVIS W CONTRAST  Result Date: 08/10/2021 CLINICAL DATA:  Complication of diverticulitis suspected. EXAM: CT ABDOMEN AND PELVIS WITH CONTRAST TECHNIQUE: Multidetector CT imaging of the abdomen and pelvis was performed using the standard protocol following bolus administration of intravenous contrast. CONTRAST:  4mL OMNIPAQUE IOHEXOL 350 MG/ML SOLN COMPARISON:  CT abdomen dated 08/06/2021. FINDINGS: Lower chest: Patchy chronic micronodularity at the bilateral lung bases, incompletely imaged, not significantly changed for greater than 5 years indicating benignity. Small RIGHT pleural effusion with adjacent atelectasis. Hepatobiliary: No focal liver abnormality is seen. Status post cholecystectomy. Pancreas: Unremarkable. No pancreatic ductal dilatation or surrounding inflammatory changes. Spleen: Normal in size without focal abnormality. Adrenals/Urinary Tract: Atrophic kidneys. LEFT renal cyst. No acute findings. Bladder is decompressed. Stomach/Bowel: Abnormal-appearing small bowel within the LEFT abdomen (proximal jejunum), with amorphous/thickened walls and surrounding mesenteric inflammation. There is new free intraperitoneal air suggesting bowel perforation, likely source being this abnormal-appearing small bowel in the LEFT abdomen, suspect bowel ischemia. Previous study showed abnormal appearing sigmoid/descending colon suggesting acute diverticulitis. This is not as clearly delineated on today's exam. The free air may, however, be the result of a ruptured diverticulitis and the abnormal-appearing small bowel in the LEFT abdomen may be reactive in nature. No extravasated oral contrast to confirm  small-bowel perforation. No dilated large or small bowel loops are identified elsewhere in the abdomen and pelvis Vascular/Lymphatic: Aortic atherosclerosis. Heavy atherosclerotic changes of the aortic branch vessels. No acute-appearing vascular abnormality. 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 No enlarged lymph nodes are seen. Reproductive: Uterus and bilateral adnexa are unremarkable. Other: Small amount of free fluid within the abdomen and pelvis. No circumscribed fluid collection, however, early abscess collection in the LEFT abdomen is difficult to exclude. Musculoskeletal: No acute-appearing osseous abnormality. 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 IMPRESSION: 1. New free intraperitoneal air indicating bowel perforation. 2. Abnormal-appearing small bowel within the LEFT abdomen (proximal jejunum), with amorphous/thickened walls and surrounding mesenteric inflammation. Findings are suspicious for small bowel perforation at this level, possibly indicating small bowel ischemia. Alternatively, this may represent reactive bowel wall thickening (see below). 3. Previous study showed abnormal appearing sigmoid/descending colon suggesting acute diverticulitis. This is not as clearly delineated on today's exam. The free air, however, may be the result of a ruptured diverticulitis with the abnormal-appearing small bowel in the LEFT abdomen being reactive in nature. 4. Scattered free fluid within the abdomen and pelvis. No circumscribed fluid collection, however, early  abscess collection in the LEFT abdomen is difficult to exclude. 5. Small RIGHT pleural effusion with adjacent atelectasis. 6. 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 Aortic Atherosclerosis (ICD10-I70.0). Critical Value/emergent results were called by telephone at the time of interpretation on 08/10/2021 at 4:58 pm to provider Dr. Cathlean Sauer, who verbally  acknowledged these results. Electronically Signed   By: Franki Cabot M.D.   On: 08/10/2021 17:00   CT ABDOMEN PELVIS W CONTRAST  Result Date: 08/06/2021 CLINICAL DATA:  Nausea/vomiting Abdominal pain, acute, nonlocalized EXAM: CT ABDOMEN AND PELVIS WITH CONTRAST TECHNIQUE: Multidetector CT imaging of the abdomen and pelvis was performed using the standard protocol following bolus administration of intravenous contrast. CONTRAST:  170mL OMNIPAQUE IOHEXOL 350 MG/ML SOLN COMPARISON:  07/21/2020 FINDINGS: Lower chest: Dependent bibasilar atelectasis. Cardiomegaly. Coronary artery calcification. Hepatobiliary: No focal liver abnormality is seen. Status post cholecystectomy. No biliary dilatation. Pancreas: Unremarkable. No pancreatic ductal dilatation or surrounding inflammatory changes. Spleen: Normal in size without focal abnormality. Adrenals/Urinary Tract: Unremarkable adrenal glands. Advanced bilateral renal atrophy. Left renal cyst has slightly increased in size from prior now measuring 6.4 cm (previously 5.6 cm). No hydronephrosis. Urinary bladder is decompressed, limiting its evaluation. Stomach/Bowel: Stomach is unremarkable. No dilated loops of bowel. There is a focally thickened short segment of sigmoid colon which contains multiple diverticula. Adjacent pericolonic fat stranding, free fluid, and numerous foci of extraluminal air (series 3, images 38-47). No organized fluid collection or abscess. There is mild long segment thickening throughout the remaining colon. There are multiple thickened appearing loops of small bowel within the abdomen, likely a reactive enteritis. Vascular/Lymphatic: Advanced atherosclerotic calcifications throughout the aortoiliac axis. 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 No lymphadenopathy. Reproductive: Uterus and bilateral adnexa are unremarkable. Other: Small volume ascites.  Chronic soft tissue density in the anterolateral pelvis with scattered internal calcifications measuring approximately 4.9 x 2.4 cm, unchanged, and most compatible with a chronic hematoma. Musculoskeletal: Chronic changes of renal osteodystrophy. Chronic erosive changes centered at the L4-5 disc, without evidence of progression from prior. Unchanged grade 2 anterolisthesis of L4 on L5. No new or acute bony findings. IMPRESSION: 1. Acute sigmoid diverticulitis with a few adjacent locules of extraluminal air compatible with microperforation. No organized fluid collection or abscess. Surgical consultation recommended. 2. Multiple thickened appearing loops of large and small bowel within the abdomen, likely a reactive enterocolitis. 3. Small volume ascites. 4. 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 5. Chronic changes of renal osteodystrophy. Chronic erosive changes centered at the L4-5 disc, without evidence of progression from prior study. Aortic Atherosclerosis (ICD10-I70.0). These results were called by telephone at the time of interpretation on 08/06/2021 at 3:09 pm to provider Scarlett Presto, MD, who verbally acknowledged these results. Electronically Signed   By: Davina Poke D.O.   On: 08/06/2021 15:12   DG Chest Portable 1 View  Result Date: 08/06/2021 CLINICAL DATA:  abdominal pain EXAM: PORTABLE CHEST 1 VIEW COMPARISON:  07/27/2021. FINDINGS: Similar enlargement the cardiac silhouette. Pulmonary vascular congestion. Calcific atherosclerosis of the aorta. Left basilar opacities. No visible pleural effusions or pneumothorax. Right upper quadrant clips. Partially imaged cervical ACDF. IMPRESSION: 1. Cardiomegaly and pulmonary vascular congestion. 2. Left basilar opacities, which could represent atelectasis, aspiration, and/or pneumonia. Electronically Signed   By: Margaretha Sheffield M.D.   On: 08/06/2021 13:41    Microbiology: Recent  Results (from the past  240 hour(s))  SARS CORONAVIRUS 2 (TAT 6-24 HRS) Nasopharyngeal Nasopharyngeal Swab     Status: None   Collection Time: 08/17/21 12:35 PM   Specimen: Nasopharyngeal Swab  Result Value Ref Range Status   SARS Coronavirus 2 NEGATIVE NEGATIVE Final    Comment: (NOTE) SARS-CoV-2 target nucleic acids are NOT DETECTED.  The SARS-CoV-2 RNA is generally detectable in upper and lower respiratory specimens during the acute phase of infection. Negative results do not preclude SARS-CoV-2 infection, do not rule out co-infections with other pathogens, and should not be used as the sole basis for treatment or other patient management decisions. Negative results must be combined with clinical observations, patient history, and epidemiological information. The expected result is Negative.  Fact Sheet for Patients: SugarRoll.be  Fact Sheet for Healthcare Providers: https://www.woods-mathews.com/  This test is not yet approved or cleared by the [place] FDA and  has been authorized for detection and/or diagnosis of SARS-CoV-2 by FDA under an Emergency Use Authorization (EUA). This EUA will remain  in effect (meaning this test can be used) for the duration of the COVID-19 declaration under Se ction 564(b)(1) of the Act, 21 U.S.C. section 360bbb-3(b)(1), unless the authorization is terminated or revoked sooner.  Performed at [facility], [place] [address], [place], [place] [address]      Labs: Basic Metabolic Panel: Recent Labs  Lab 08/14/21 0352 08/15/21 0100 08/18/21 0053 08/20/21 0800  NA 133* 131* 132* 131*  K 2.9* 3.6 6.1* 3.5  CL 95* 97* 100 94*  CO2 24 21* 19* 24  GLUCOSE 115* 105* 122* 118*  BUN 11 17 47* 46*  CREATININE 3.34* 4.59* 6.53* 5.74*  CALCIUM 8.3* 7.9* 9.0 8.8*  PHOS  --  2.2*  --  2.0*    Liver Function Tests: Recent Labs  Lab 08/15/21 0100 08/20/21 0800  ALBUMIN 2.3* 2.2*    No  results for input(s): LIPASE, AMYLASE in the last 168 hours. No results for input(s): AMMONIA in the last 168 hours. CBC: Recent Labs  Lab 08/14/21 0352 08/15/21 0100 08/18/21 0813 08/20/21 0948  WBC 9.2 8.6 10.4 8.7  HGB 10.6* 10.4* 11.0* 9.9*  HCT 31.3* 31.0* 34.5* 29.0*  MCV 88.9 89.6 92.2 89.5  PLT 283 273 296 293    Cardiac Enzymes: No results for input(s): CKTOTAL, CKMB, CKMBINDEX, TROPONINI in the last 168 hours. BNP: BNP (last 3 results) Recent Labs    04/06/21 1209 05/18/21 0653  BNP 3,215.6* 2,937.5*     ProBNP (last 3 results) No results for input(s): PROBNP in the last 8760 hours.  CBG: No results for input(s): GLUCAP in the last 168 hours.     Signed:  Berle Mull MD   [facility] 08/20/2021, 3:22 PM

## 2021-08-20 NOTE — Consult Note (Signed)
[facility] Inpatient Consult   08/20/2021  JAZILYN SIEGENTHALER [birth year] [id number]  Patient chart has been reviewed for extreme high risk score for unplanned readmissions.  Patient assessed for community [place] Management follow up needs.  Chart review reveals current disposition is for skilled nursing facility. No [facility] CM needs.  Netta Cedars, MSN, [facility] Liaison Nurse Mobile Phone [phone]  Toll free office [phone]

## 2021-08-21 DIAGNOSIS — T8612 Kidney transplant failure: Secondary | ICD-10-CM | POA: Diagnosis not present

## 2021-08-21 DIAGNOSIS — R531 Weakness: Secondary | ICD-10-CM | POA: Diagnosis not present

## 2021-08-21 DIAGNOSIS — R630 Anorexia: Secondary | ICD-10-CM | POA: Diagnosis not present

## 2021-08-21 DIAGNOSIS — J9811 Atelectasis: Secondary | ICD-10-CM | POA: Diagnosis not present

## 2021-08-21 DIAGNOSIS — M6281 Muscle weakness (generalized): Secondary | ICD-10-CM | POA: Diagnosis not present

## 2021-08-21 DIAGNOSIS — Z7401 Bed confinement status: Secondary | ICD-10-CM | POA: Diagnosis not present

## 2021-08-21 DIAGNOSIS — I10 Essential (primary) hypertension: Secondary | ICD-10-CM | POA: Diagnosis not present

## 2021-08-21 DIAGNOSIS — I5032 Chronic diastolic (congestive) heart failure: Secondary | ICD-10-CM | POA: Diagnosis not present

## 2021-08-21 DIAGNOSIS — J90 Pleural effusion, not elsewhere classified: Secondary | ICD-10-CM | POA: Diagnosis not present

## 2021-08-21 DIAGNOSIS — N2581 Secondary hyperparathyroidism of renal origin: Secondary | ICD-10-CM | POA: Diagnosis not present

## 2021-08-21 DIAGNOSIS — J9601 Acute respiratory failure with hypoxia: Secondary | ICD-10-CM | POA: Diagnosis not present

## 2021-08-21 DIAGNOSIS — Z992 Dependence on renal dialysis: Secondary | ICD-10-CM | POA: Diagnosis not present

## 2021-08-21 DIAGNOSIS — Z23 Encounter for immunization: Secondary | ICD-10-CM | POA: Diagnosis not present

## 2021-08-21 DIAGNOSIS — F419 Anxiety disorder, unspecified: Secondary | ICD-10-CM | POA: Diagnosis not present

## 2021-08-21 DIAGNOSIS — R262 Difficulty in walking, not elsewhere classified: Secondary | ICD-10-CM | POA: Diagnosis not present

## 2021-08-21 DIAGNOSIS — I2510 Atherosclerotic heart disease of native coronary artery without angina pectoris: Secondary | ICD-10-CM | POA: Diagnosis not present

## 2021-08-21 DIAGNOSIS — E43 Unspecified severe protein-calorie malnutrition: Secondary | ICD-10-CM | POA: Diagnosis not present

## 2021-08-21 DIAGNOSIS — J810 Acute pulmonary edema: Secondary | ICD-10-CM | POA: Diagnosis not present

## 2021-08-21 DIAGNOSIS — K5720 Diverticulitis of large intestine with perforation and abscess without bleeding: Secondary | ICD-10-CM | POA: Diagnosis not present

## 2021-08-21 DIAGNOSIS — I517 Cardiomegaly: Secondary | ICD-10-CM | POA: Diagnosis not present

## 2021-08-21 DIAGNOSIS — K5792 Diverticulitis of intestine, part unspecified, without perforation or abscess without bleeding: Secondary | ICD-10-CM | POA: Diagnosis present

## 2021-08-21 DIAGNOSIS — K5780 Diverticulitis of intestine, part unspecified, with perforation and abscess without bleeding: Secondary | ICD-10-CM | POA: Insufficient documentation

## 2021-08-21 DIAGNOSIS — I480 Paroxysmal atrial fibrillation: Secondary | ICD-10-CM | POA: Diagnosis not present

## 2021-08-21 DIAGNOSIS — N186 End stage renal disease: Secondary | ICD-10-CM | POA: Diagnosis not present

## 2021-08-21 LAB — HEPATITIS B SURFACE ANTIBODY, QUANTITATIVE: Hep B S AB Quant (Post): 454.9 m[IU]/mL (ref >9.9)

## 2021-08-21 NOTE — Progress Notes (Signed)
Attempt to call patient at listed number of [phone]. No answer at this time.   Mady Gemma, RN Dialysis Nurse Coordinator [phone]

## 2021-08-21 NOTE — Progress Notes (Signed)
Patient discharged to [facility] room 124 via [place].  Discharged packet given to [facility] staff and report was earlier given by day shift RN to [employer].

## 2021-08-22 DIAGNOSIS — N186 End stage renal disease: Secondary | ICD-10-CM | POA: Diagnosis not present

## 2021-08-22 DIAGNOSIS — Z992 Dependence on renal dialysis: Secondary | ICD-10-CM | POA: Diagnosis not present

## 2021-08-22 DIAGNOSIS — N2581 Secondary hyperparathyroidism of renal origin: Secondary | ICD-10-CM | POA: Diagnosis not present

## 2021-08-25 DIAGNOSIS — N186 End stage renal disease: Secondary | ICD-10-CM | POA: Diagnosis not present

## 2021-08-25 DIAGNOSIS — N2581 Secondary hyperparathyroidism of renal origin: Secondary | ICD-10-CM | POA: Diagnosis not present

## 2021-08-25 DIAGNOSIS — Z992 Dependence on renal dialysis: Secondary | ICD-10-CM | POA: Diagnosis not present

## 2021-08-27 ENCOUNTER — Telehealth (HOSPITAL_COMMUNITY): Payer: Self-pay | Admitting: *Deleted

## 2021-08-27 DIAGNOSIS — N186 End stage renal disease: Secondary | ICD-10-CM | POA: Diagnosis not present

## 2021-08-27 DIAGNOSIS — Z992 Dependence on renal dialysis: Secondary | ICD-10-CM | POA: Diagnosis not present

## 2021-08-27 DIAGNOSIS — N2581 Secondary hyperparathyroidism of renal origin: Secondary | ICD-10-CM | POA: Diagnosis not present

## 2021-08-27 NOTE — Progress Notes (Addendum)
Attempt to call patient call patient at [phone] as listed in the chart for service follow - up. No answer at this time. No message left.  Dorthey Sawyer, RN

## 2021-08-29 DIAGNOSIS — Z992 Dependence on renal dialysis: Secondary | ICD-10-CM | POA: Diagnosis not present

## 2021-08-29 DIAGNOSIS — N2581 Secondary hyperparathyroidism of renal origin: Secondary | ICD-10-CM | POA: Diagnosis not present

## 2021-08-29 DIAGNOSIS — N186 End stage renal disease: Secondary | ICD-10-CM | POA: Diagnosis not present

## 2021-09-01 DIAGNOSIS — N186 End stage renal disease: Secondary | ICD-10-CM | POA: Diagnosis not present

## 2021-09-01 DIAGNOSIS — N2581 Secondary hyperparathyroidism of renal origin: Secondary | ICD-10-CM | POA: Diagnosis not present

## 2021-09-01 DIAGNOSIS — Z992 Dependence on renal dialysis: Secondary | ICD-10-CM | POA: Diagnosis not present

## 2021-09-02 DIAGNOSIS — E43 Unspecified severe protein-calorie malnutrition: Secondary | ICD-10-CM | POA: Diagnosis not present

## 2021-09-02 DIAGNOSIS — R630 Anorexia: Secondary | ICD-10-CM | POA: Diagnosis not present

## 2021-09-03 DIAGNOSIS — Z992 Dependence on renal dialysis: Secondary | ICD-10-CM | POA: Diagnosis not present

## 2021-09-03 DIAGNOSIS — N2581 Secondary hyperparathyroidism of renal origin: Secondary | ICD-10-CM | POA: Diagnosis not present

## 2021-09-03 DIAGNOSIS — N186 End stage renal disease: Secondary | ICD-10-CM | POA: Diagnosis not present

## 2021-09-05 DIAGNOSIS — N186 End stage renal disease: Secondary | ICD-10-CM | POA: Diagnosis not present

## 2021-09-05 DIAGNOSIS — Z992 Dependence on renal dialysis: Secondary | ICD-10-CM | POA: Diagnosis not present

## 2021-09-05 DIAGNOSIS — N2581 Secondary hyperparathyroidism of renal origin: Secondary | ICD-10-CM | POA: Diagnosis not present

## 2021-09-08 ENCOUNTER — Encounter (HOSPITAL_COMMUNITY): Payer: Self-pay | Admitting: Radiology

## 2021-09-08 DIAGNOSIS — N186 End stage renal disease: Secondary | ICD-10-CM | POA: Diagnosis not present

## 2021-09-08 DIAGNOSIS — N2581 Secondary hyperparathyroidism of renal origin: Secondary | ICD-10-CM | POA: Diagnosis not present

## 2021-09-08 DIAGNOSIS — Z992 Dependence on renal dialysis: Secondary | ICD-10-CM | POA: Diagnosis not present

## 2021-09-10 DIAGNOSIS — N186 End stage renal disease: Secondary | ICD-10-CM | POA: Diagnosis not present

## 2021-09-10 DIAGNOSIS — N2581 Secondary hyperparathyroidism of renal origin: Secondary | ICD-10-CM | POA: Diagnosis not present

## 2021-09-10 DIAGNOSIS — Z992 Dependence on renal dialysis: Secondary | ICD-10-CM | POA: Diagnosis not present

## 2021-09-12 DIAGNOSIS — T8612 Kidney transplant failure: Secondary | ICD-10-CM | POA: Diagnosis not present

## 2021-09-12 DIAGNOSIS — Z992 Dependence on renal dialysis: Secondary | ICD-10-CM | POA: Diagnosis not present

## 2021-09-12 DIAGNOSIS — N2581 Secondary hyperparathyroidism of renal origin: Secondary | ICD-10-CM | POA: Diagnosis not present

## 2021-09-12 DIAGNOSIS — N186 End stage renal disease: Secondary | ICD-10-CM | POA: Diagnosis not present

## 2021-09-15 DIAGNOSIS — Z992 Dependence on renal dialysis: Secondary | ICD-10-CM | POA: Diagnosis not present

## 2021-09-15 DIAGNOSIS — N2581 Secondary hyperparathyroidism of renal origin: Secondary | ICD-10-CM | POA: Diagnosis not present

## 2021-09-15 DIAGNOSIS — N186 End stage renal disease: Secondary | ICD-10-CM | POA: Diagnosis not present

## 2021-09-17 DIAGNOSIS — N2581 Secondary hyperparathyroidism of renal origin: Secondary | ICD-10-CM | POA: Diagnosis not present

## 2021-09-17 DIAGNOSIS — N186 End stage renal disease: Secondary | ICD-10-CM | POA: Diagnosis not present

## 2021-09-17 DIAGNOSIS — Z992 Dependence on renal dialysis: Secondary | ICD-10-CM | POA: Diagnosis not present

## 2021-09-19 DIAGNOSIS — E43 Unspecified severe protein-calorie malnutrition: Secondary | ICD-10-CM | POA: Diagnosis not present

## 2021-09-19 DIAGNOSIS — M6281 Muscle weakness (generalized): Secondary | ICD-10-CM | POA: Diagnosis not present

## 2021-09-19 DIAGNOSIS — I2510 Atherosclerotic heart disease of native coronary artery without angina pectoris: Secondary | ICD-10-CM | POA: Diagnosis not present

## 2021-09-19 DIAGNOSIS — I10 Essential (primary) hypertension: Secondary | ICD-10-CM | POA: Diagnosis not present

## 2021-09-19 DIAGNOSIS — N2581 Secondary hyperparathyroidism of renal origin: Secondary | ICD-10-CM | POA: Diagnosis not present

## 2021-09-19 DIAGNOSIS — I517 Cardiomegaly: Secondary | ICD-10-CM | POA: Diagnosis not present

## 2021-09-19 DIAGNOSIS — N186 End stage renal disease: Secondary | ICD-10-CM | POA: Diagnosis not present

## 2021-09-19 DIAGNOSIS — Z992 Dependence on renal dialysis: Secondary | ICD-10-CM | POA: Diagnosis not present

## 2021-09-19 DIAGNOSIS — I5032 Chronic diastolic (congestive) heart failure: Secondary | ICD-10-CM | POA: Diagnosis not present

## 2021-09-19 DIAGNOSIS — R630 Anorexia: Secondary | ICD-10-CM | POA: Diagnosis not present

## 2021-09-19 DIAGNOSIS — I480 Paroxysmal atrial fibrillation: Secondary | ICD-10-CM | POA: Diagnosis not present

## 2021-09-22 DIAGNOSIS — N2581 Secondary hyperparathyroidism of renal origin: Secondary | ICD-10-CM | POA: Diagnosis not present

## 2021-09-22 DIAGNOSIS — Z992 Dependence on renal dialysis: Secondary | ICD-10-CM | POA: Diagnosis not present

## 2021-09-22 DIAGNOSIS — N186 End stage renal disease: Secondary | ICD-10-CM | POA: Diagnosis not present

## 2021-09-22 IMAGING — DX DG CHEST 2V
2 series · 2 of 2 positions shown · non-contrast
Comparison: 12/16/2020

CLINICAL DATA: Shortness of breath

EXAM:
CHEST - 2 VIEW

[chest pa]
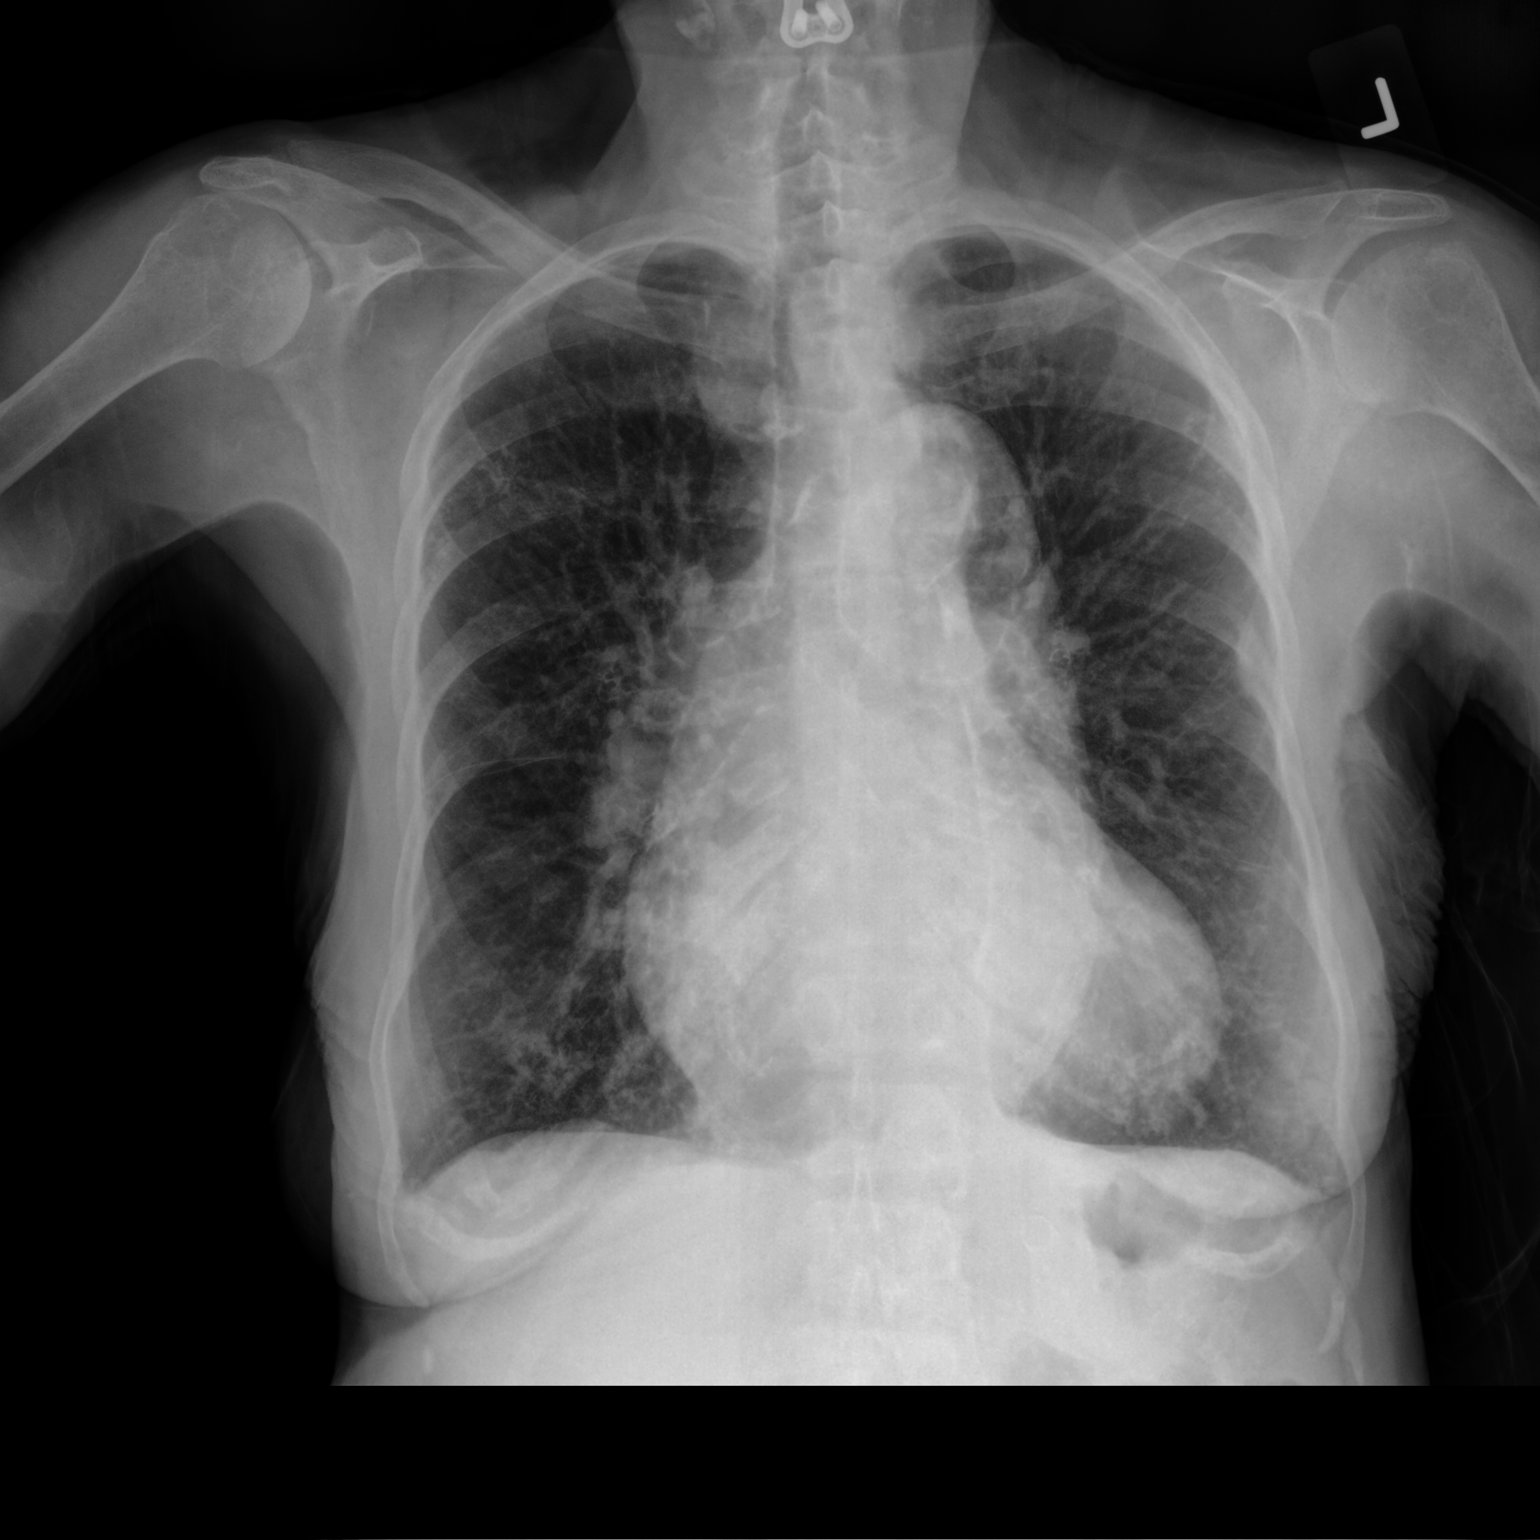

[chest lat]
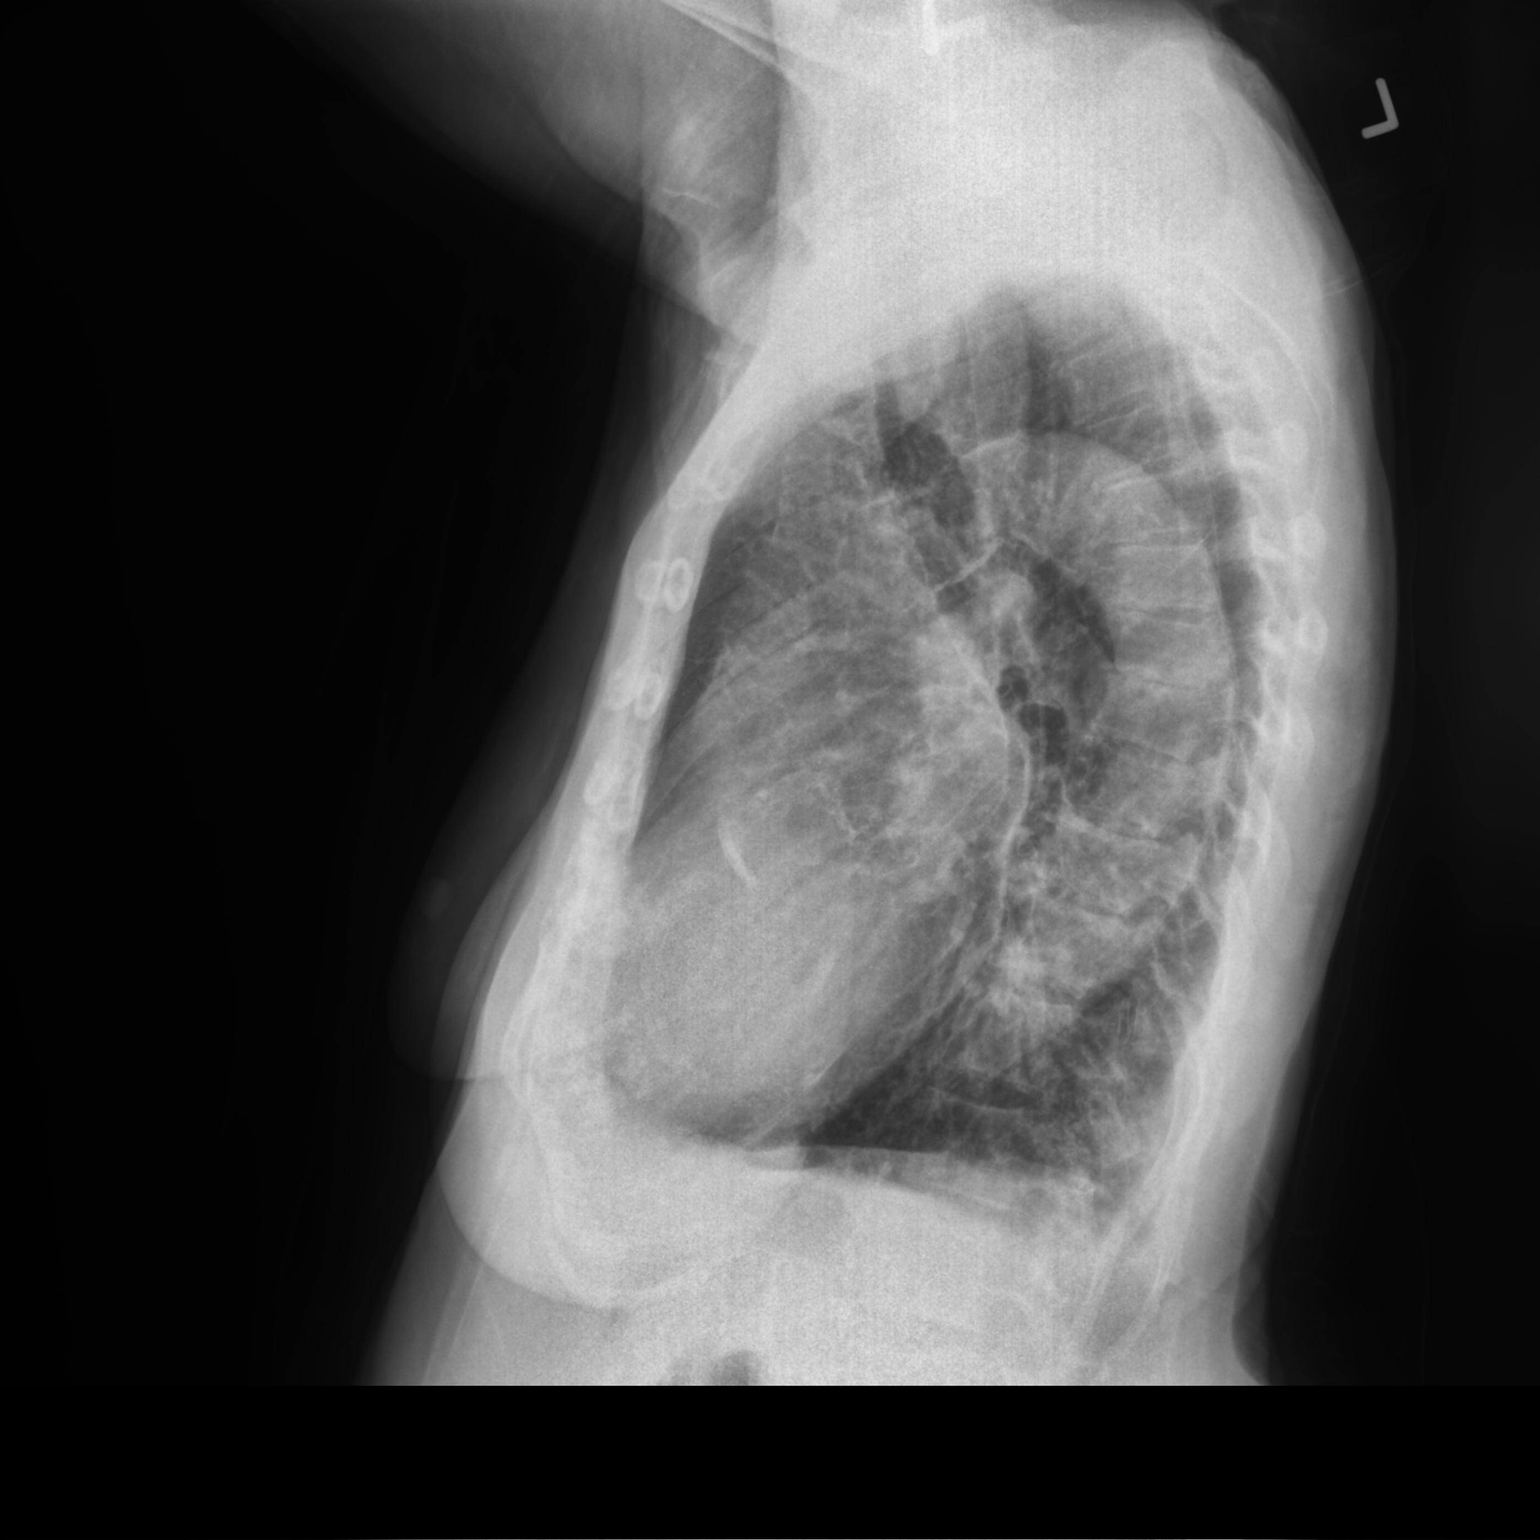

[2 of 2 positions shown; findings below may reference images not displayed]

FINDINGS: Cardiomegaly. Mild pulmonary hyperinflation. Unchanged mild, diffuse
interstitial pulmonary opacity. No new or focal airspace opacity.
Disc degenerative disease of the thoracic spine.
IMPRESSION: 1. Cardiomegaly.
2. Unchanged mild, diffuse interstitial pulmonary opacity, likely
mild edema. No new or focal airspace opacity.

## 2021-09-24 DIAGNOSIS — N186 End stage renal disease: Secondary | ICD-10-CM | POA: Diagnosis not present

## 2021-09-24 DIAGNOSIS — N2581 Secondary hyperparathyroidism of renal origin: Secondary | ICD-10-CM | POA: Diagnosis not present

## 2021-09-24 DIAGNOSIS — Z992 Dependence on renal dialysis: Secondary | ICD-10-CM | POA: Diagnosis not present

## 2021-09-25 DIAGNOSIS — J9601 Acute respiratory failure with hypoxia: Secondary | ICD-10-CM | POA: Diagnosis not present

## 2021-09-25 DIAGNOSIS — R262 Difficulty in walking, not elsewhere classified: Secondary | ICD-10-CM | POA: Diagnosis not present

## 2021-09-25 DIAGNOSIS — I5032 Chronic diastolic (congestive) heart failure: Secondary | ICD-10-CM | POA: Diagnosis not present

## 2021-09-25 DIAGNOSIS — M6281 Muscle weakness (generalized): Secondary | ICD-10-CM | POA: Diagnosis not present

## 2021-09-26 DIAGNOSIS — Z992 Dependence on renal dialysis: Secondary | ICD-10-CM | POA: Diagnosis not present

## 2021-09-26 DIAGNOSIS — N2581 Secondary hyperparathyroidism of renal origin: Secondary | ICD-10-CM | POA: Diagnosis not present

## 2021-09-26 DIAGNOSIS — N186 End stage renal disease: Secondary | ICD-10-CM | POA: Diagnosis not present

## 2021-09-27 DIAGNOSIS — K219 Gastro-esophageal reflux disease without esophagitis: Secondary | ICD-10-CM | POA: Diagnosis not present

## 2021-09-27 DIAGNOSIS — I722 Aneurysm of renal artery: Secondary | ICD-10-CM | POA: Diagnosis not present

## 2021-09-27 DIAGNOSIS — E785 Hyperlipidemia, unspecified: Secondary | ICD-10-CM | POA: Diagnosis not present

## 2021-09-27 DIAGNOSIS — I132 Hypertensive heart and chronic kidney disease with heart failure and with stage 5 chronic kidney disease, or end stage renal disease: Secondary | ICD-10-CM | POA: Diagnosis not present

## 2021-09-27 DIAGNOSIS — N186 End stage renal disease: Secondary | ICD-10-CM | POA: Diagnosis not present

## 2021-09-27 DIAGNOSIS — F419 Anxiety disorder, unspecified: Secondary | ICD-10-CM | POA: Diagnosis not present

## 2021-09-27 DIAGNOSIS — K5720 Diverticulitis of large intestine with perforation and abscess without bleeding: Secondary | ICD-10-CM | POA: Diagnosis not present

## 2021-09-27 DIAGNOSIS — I480 Paroxysmal atrial fibrillation: Secondary | ICD-10-CM | POA: Diagnosis not present

## 2021-09-27 DIAGNOSIS — I5032 Chronic diastolic (congestive) heart failure: Secondary | ICD-10-CM | POA: Diagnosis not present

## 2021-09-29 DIAGNOSIS — N2581 Secondary hyperparathyroidism of renal origin: Secondary | ICD-10-CM | POA: Diagnosis not present

## 2021-09-29 DIAGNOSIS — R0602 Shortness of breath: Secondary | ICD-10-CM | POA: Diagnosis not present

## 2021-09-29 DIAGNOSIS — N186 End stage renal disease: Secondary | ICD-10-CM | POA: Diagnosis not present

## 2021-09-29 DIAGNOSIS — Z992 Dependence on renal dialysis: Secondary | ICD-10-CM | POA: Diagnosis not present

## 2021-09-30 IMAGING — CT CT HEAD CODE STROKE
4 series · 16 of 47 positions shown, 18 images · non-contrast
Comparison: Head CT 05/13/2018

CLINICAL DATA: Code stroke.  Slurred speech

EXAM:
CT HEAD WITHOUT CONTRAST
TECHNIQUE: Contiguous axial images were obtained from the base of the skull
through the vertex without intravenous contrast.

[Series 3: head wo · axial · 0.45mm/px · z∈[+1210,+1320]mm · 7 of 30 slices shown, 9 images]
[im 4/30  brain]
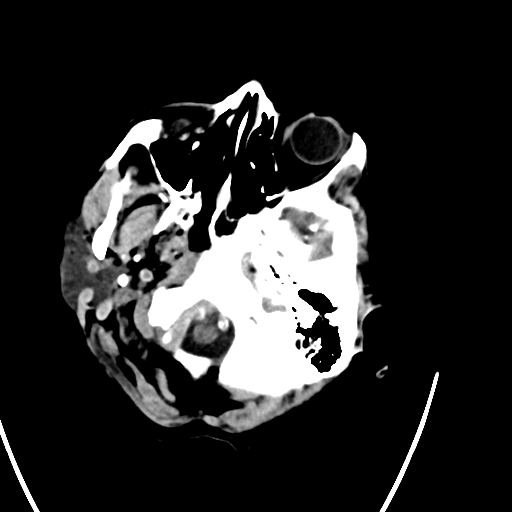
[im 4/30  bone]
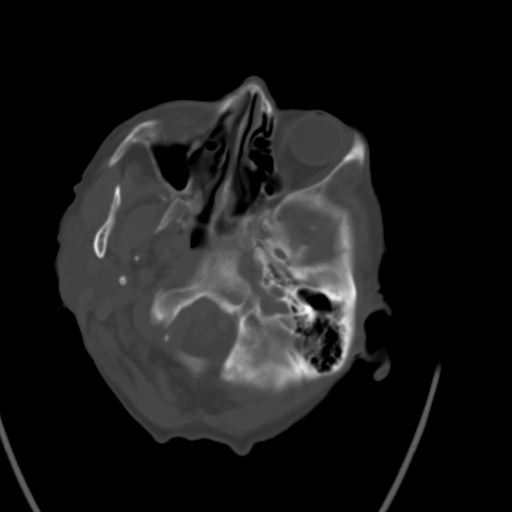
[im 8/30  brain]
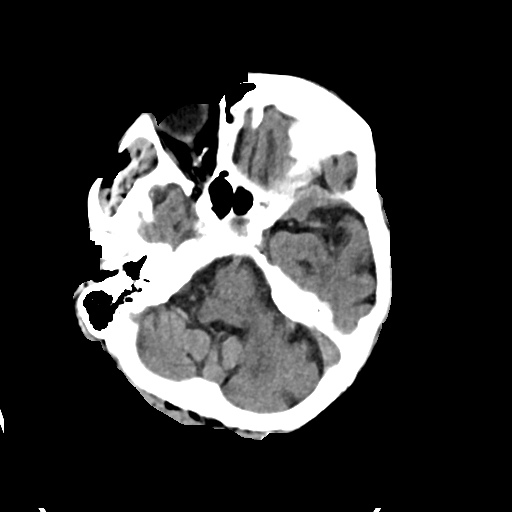
[im 11/30  brain]
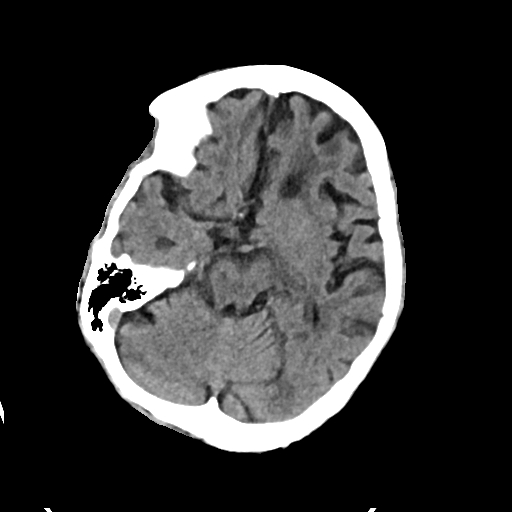
[im 15/30  brain]
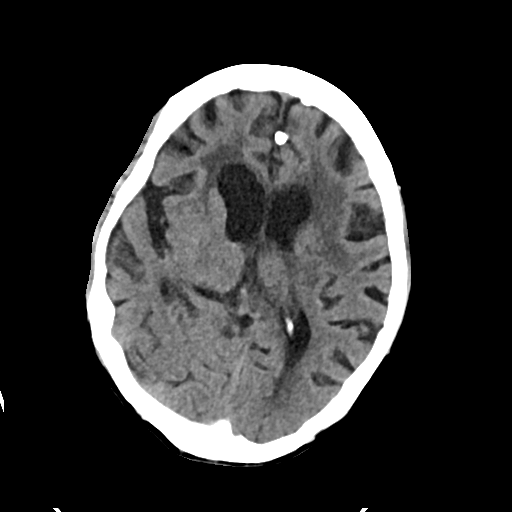
[im 19/30  brain]
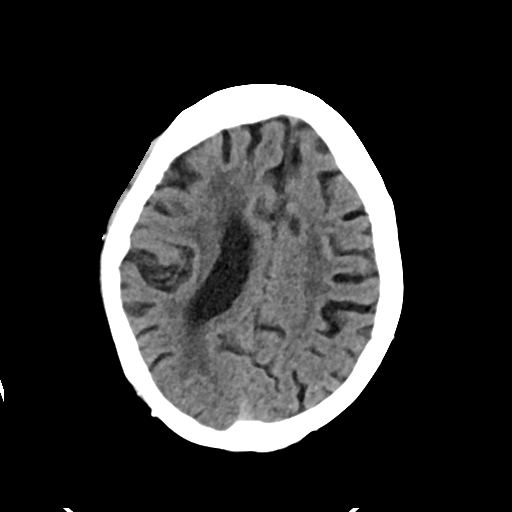
[im 19/30  bone]
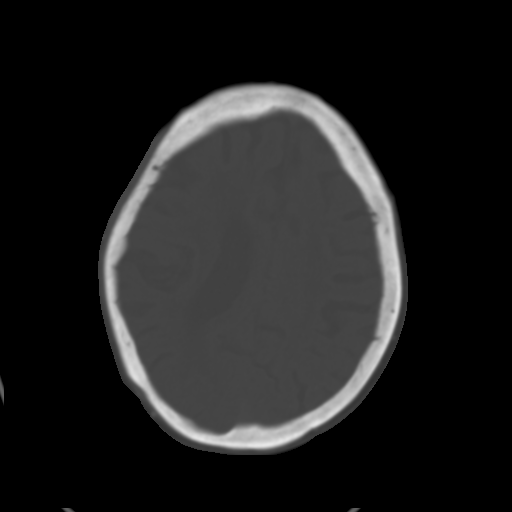
[im 22/30  brain]
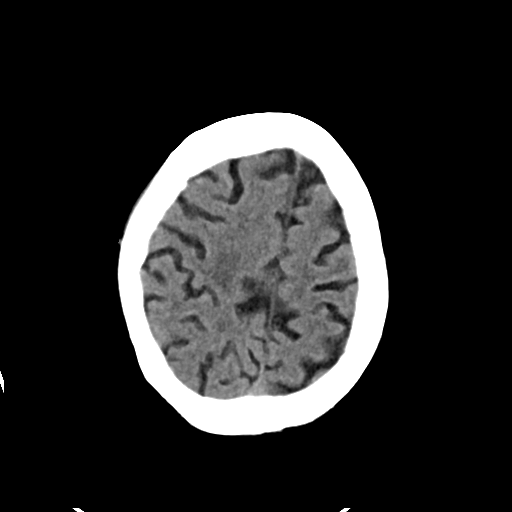
[im 26/30  brain]
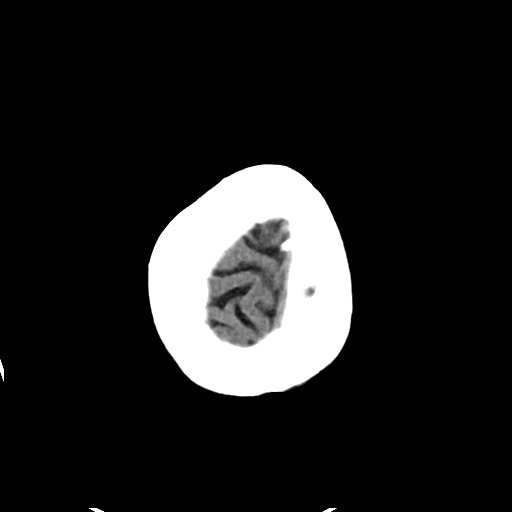

[Series 4: head bone · axial · 0.45mm/px · z∈[+1209,+1239]mm · 3 of 75 slices shown]
[im 8/75  bone]
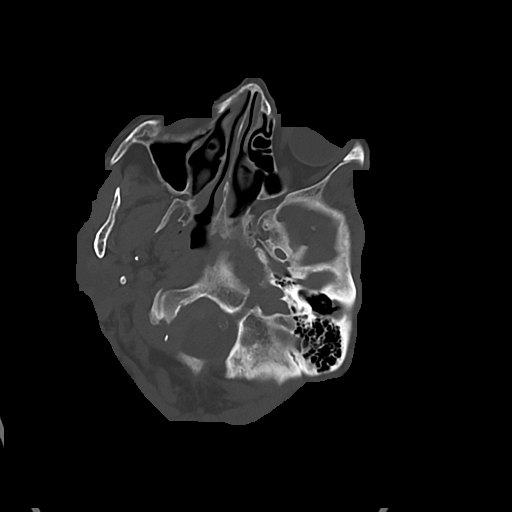
[im 15/75  bone]
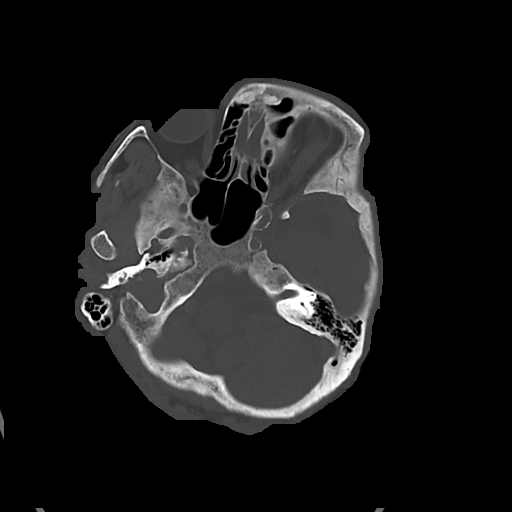
[im 23/75  bone]
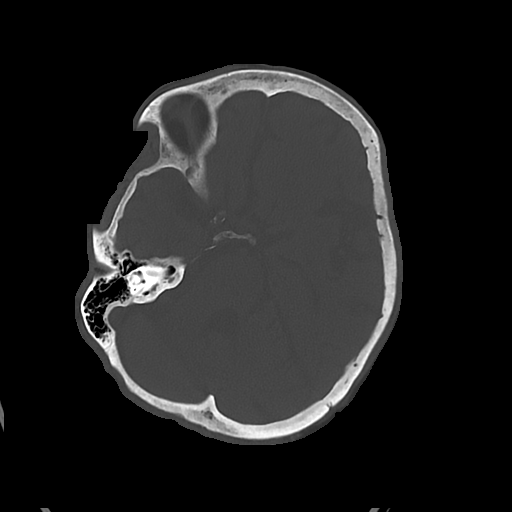

[Series 5: cor soft · coronal · 0.36mm/px · 3 of 72 slices shown]
[im 24/72  brain]
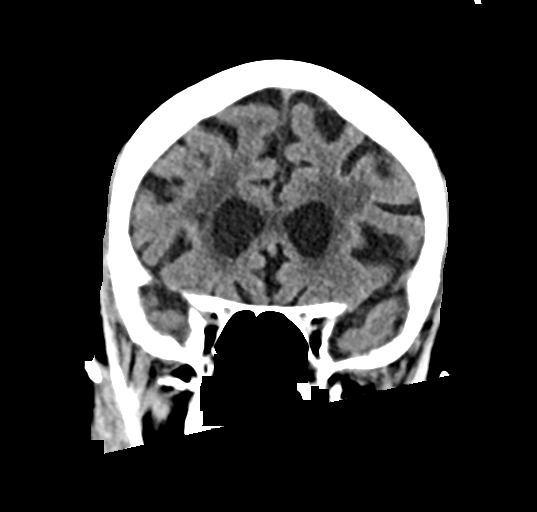
[im 32/72  brain]
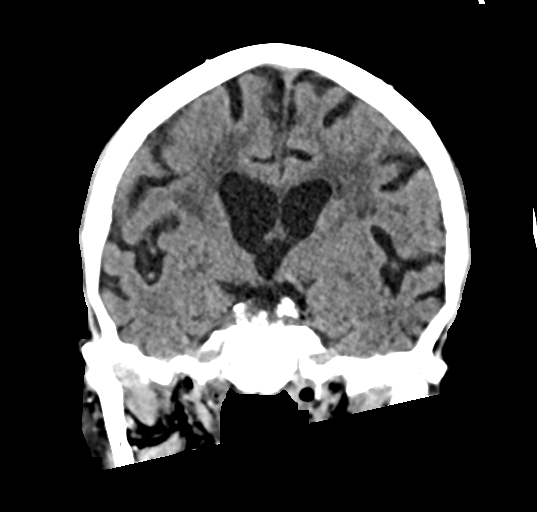
[im 40/72  brain]
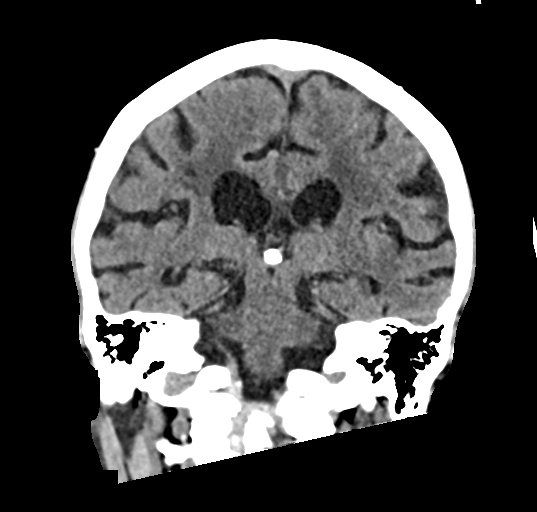

[Series 6: sag soft · sagittal · 0.36mm/px · 3 of 56 slices shown]
[im 22/56  brain]
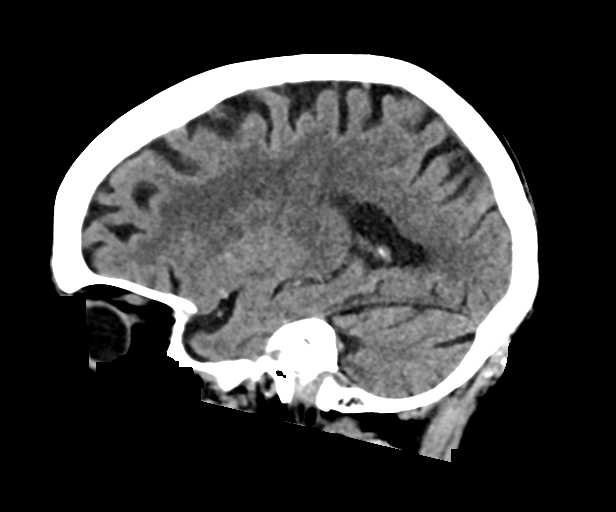
[im 27/56  brain]
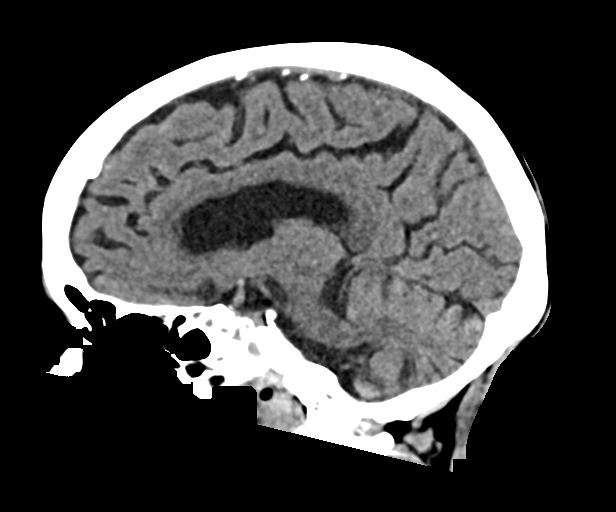
[im 32/56  brain]
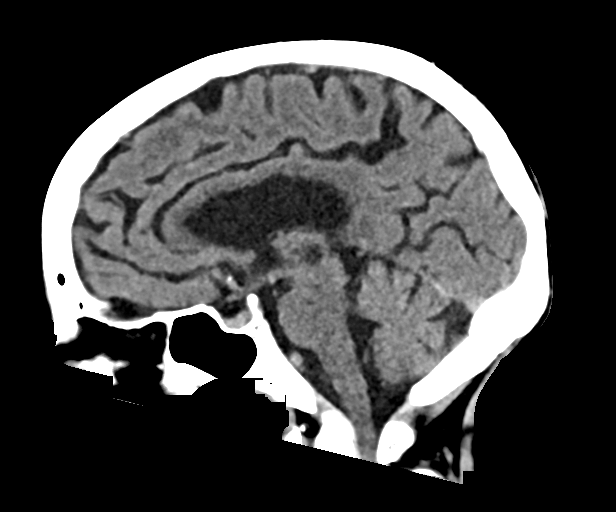

[16 of 47 positions shown; findings below may reference images not displayed]

FINDINGS: Brain: There is no mass, hemorrhage or extra-axial collection. There
is generalized atrophy without lobar predilection. There is
hypoattenuation of the periventricular white matter, most commonly
indicating chronic ischemic microangiopathy.

Vascular: Atherosclerotic calcification of the vertebral and
internal carotid arteries at the skull base. No abnormal
hyperdensity of the major intracranial arteries or dural venous
sinuses.

Skull: The visualized skull base, calvarium and extracranial soft
tissues are normal. Upper cervical spinal canal appears narrowed.

Sinuses/Orbits: No fluid levels or advanced mucosal thickening of
the visualized paranasal sinuses. No mastoid or middle ear effusion.
The orbits are normal.

ASPECTS (Alberta Stroke Program Early CT Score)

- Ganglionic level infarction (caudate, lentiform nuclei, internal
capsule, insula, M1-M3 cortex): 7

- Supraganglionic infarction (M4-M6 cortex): 3

Total score (0-10 with 10 being normal): 10
IMPRESSION: 1. No acute intracranial abnormality.
2. ASPECTS is 10.
3. Suspect C2 cervical spinal canal stenosis.

These results were communicated to Dr. Anohj Izhar at [DATE] on
02/21/2021 by text page via the [employer]ION messaging system.

## 2021-10-01 DIAGNOSIS — Z992 Dependence on renal dialysis: Secondary | ICD-10-CM | POA: Diagnosis not present

## 2021-10-01 DIAGNOSIS — N186 End stage renal disease: Secondary | ICD-10-CM | POA: Diagnosis not present

## 2021-10-01 DIAGNOSIS — N2581 Secondary hyperparathyroidism of renal origin: Secondary | ICD-10-CM | POA: Diagnosis not present

## 2021-10-01 IMAGING — MR MR MRA HEAD W/O CM
1 series · 19 of 48 positions shown · non-contrast
Comparison: None.

CLINICAL DATA: Slurred speech and abnormal gait

EXAM:
MRI HEAD WITHOUT CONTRAST
MRA HEAD WITHOUT CONTRAST
TECHNIQUE: Multiplanar, multiecho pulse sequences of the brain and surrounding
structures were obtained without intravenous contrast. Angiographic
images of the head were obtained using MRA technique without
contrast.

[Series 5: 3d cow · axial · 0.5mm · 0.41mm/px · z∈[-107,-21]mm · 19 of 188 slices shown]
[im 1/188]
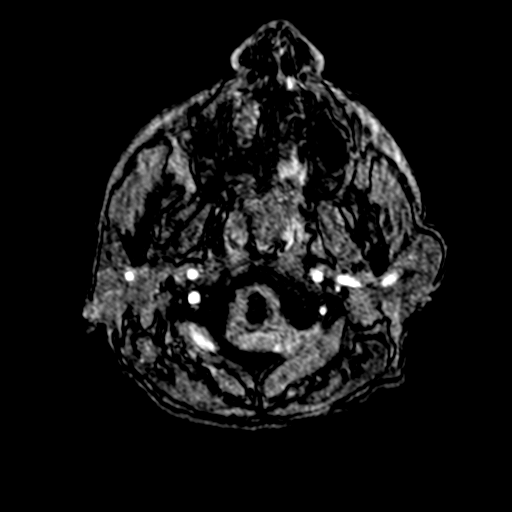
[im 4/188]
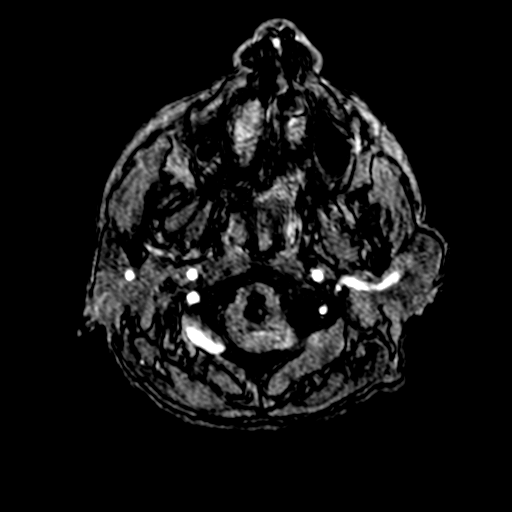
[im 8/188]
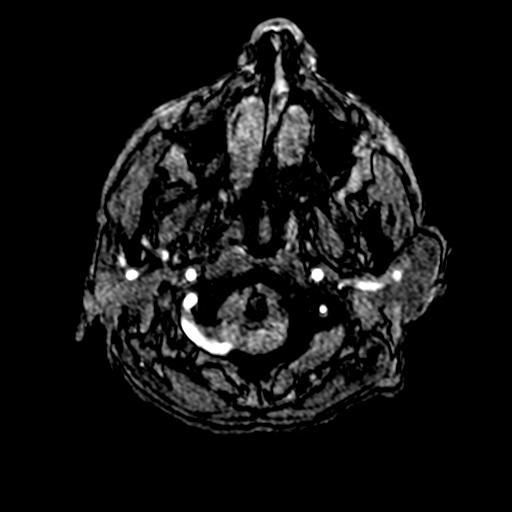
[im 12/188]
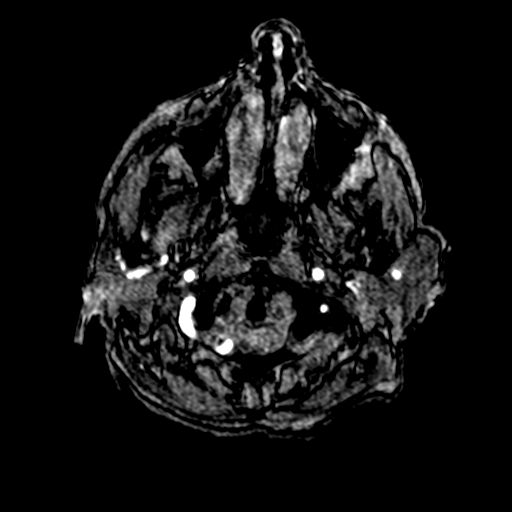
[im 16/188]
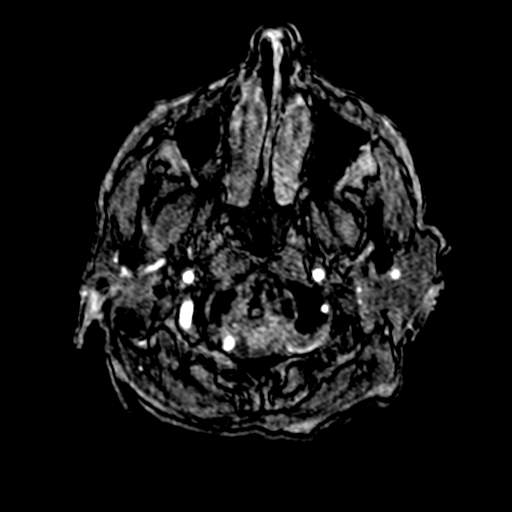
[im 20/188]
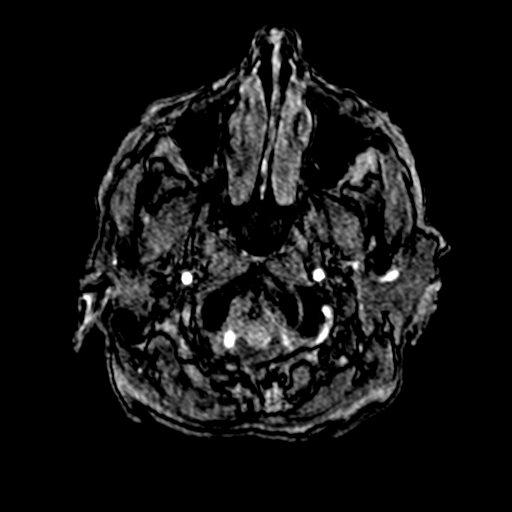
[im 24/188]
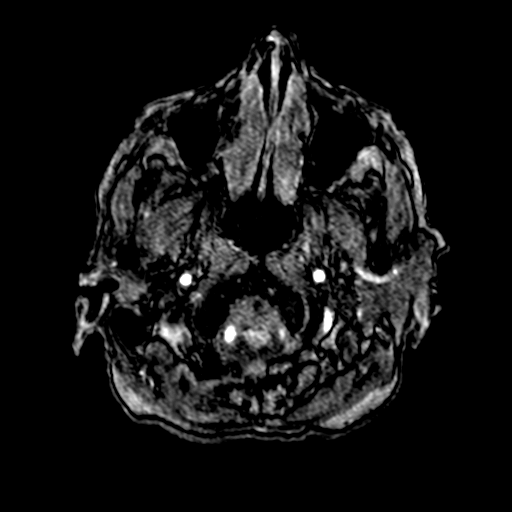
[im 28/188]
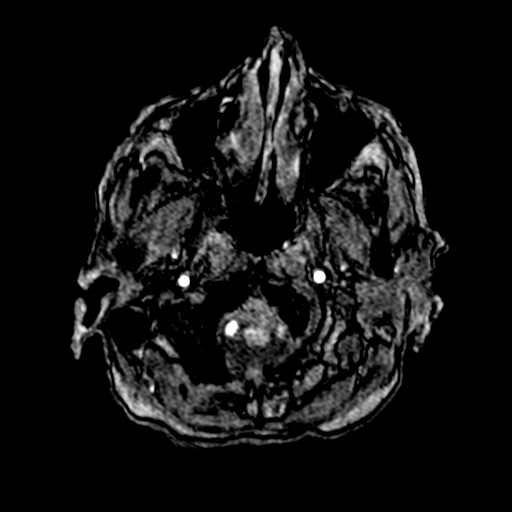
[im 32/188]
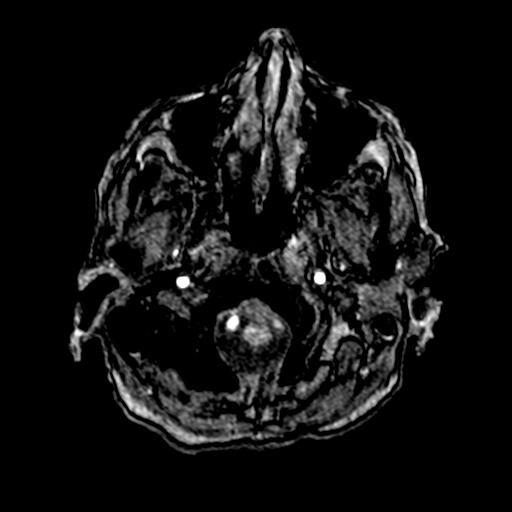
[im 36/188]
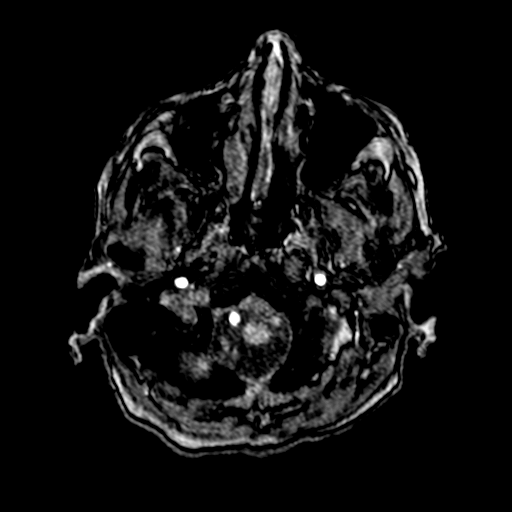
[im 40/188]
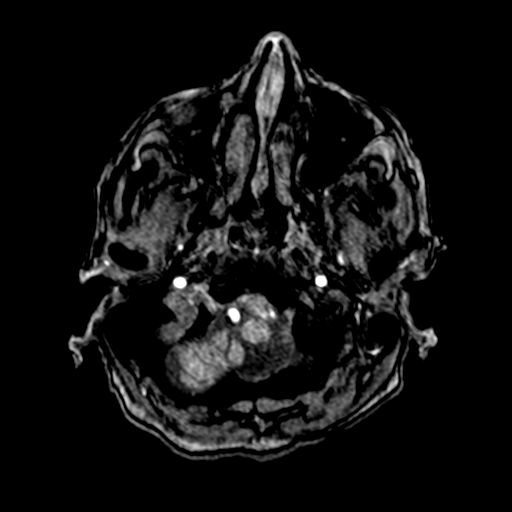
[im 60/188]
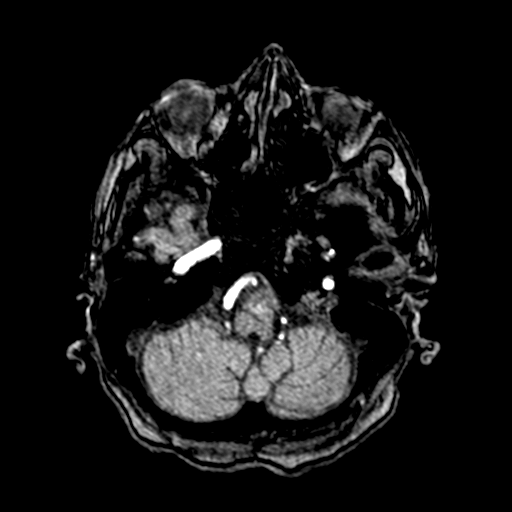
[im 84/188]
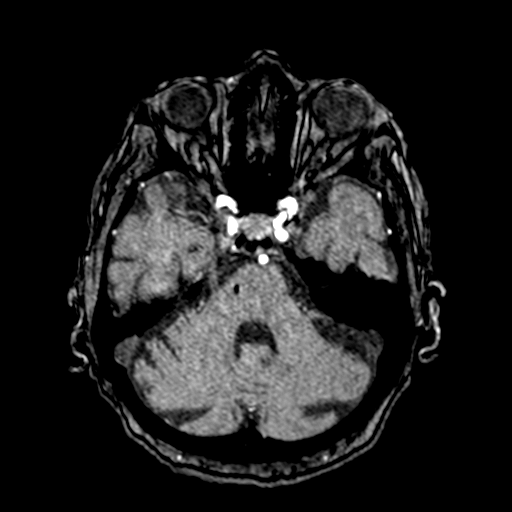
[im 96/188]
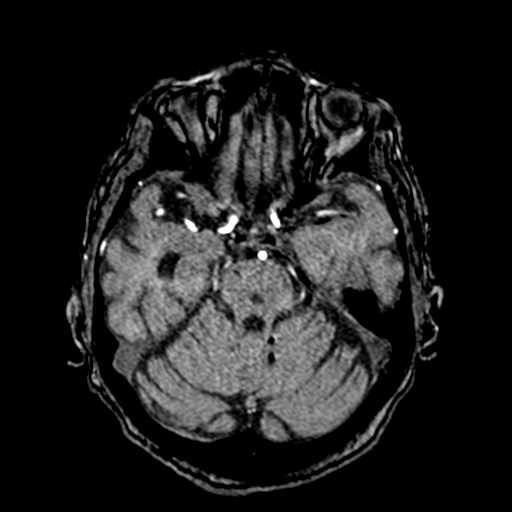
[im 108/188]
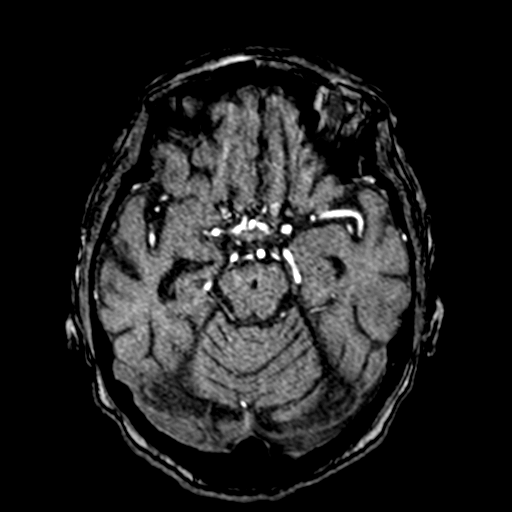
[im 132/188]
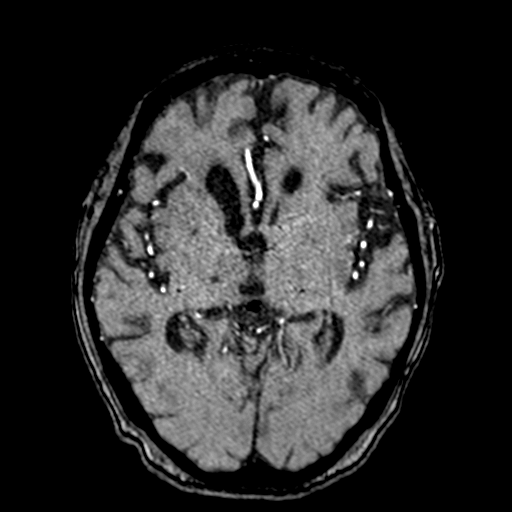
[im 156/188]
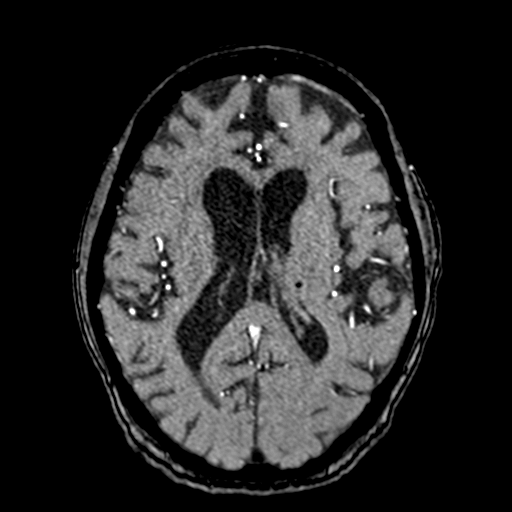
[im 160/188]
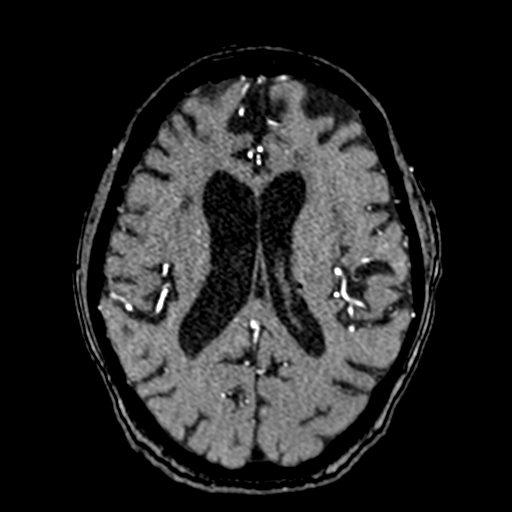
[im 180/188]
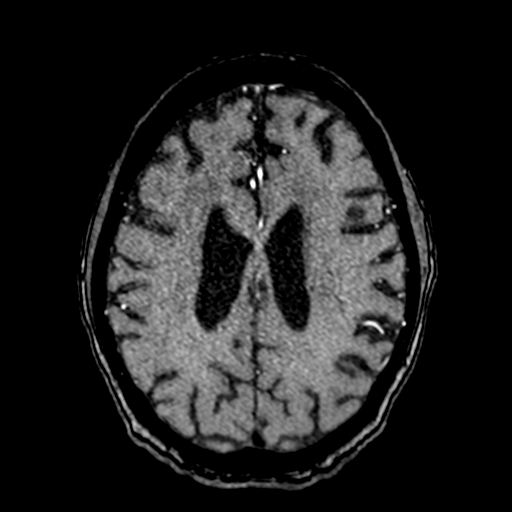

[19 of 48 positions shown; findings below may reference images not displayed]

FINDINGS: MRI HEAD FINDINGS

Brain: No acute infarct, mass effect or extra-axial collection.
Numerous chronic microhemorrhages supratentorially and
infratentorially. Distribution is unchanged. Hyperintense
T2-weighted signal is moderately widespread throughout the white
matter. Generalized volume loss without a clear lobar predilection.
The midline structures are normal.

Vascular: Major flow voids are preserved.

Skull and upper cervical spine: Normal calvarium and skull base.
Visualized upper cervical spine and soft tissues are normal.

Sinuses/Orbits:No paranasal sinus fluid levels or advanced mucosal
thickening. No mastoid or middle ear effusion. Normal orbits.

MRA HEAD FINDINGS

POSTERIOR CIRCULATION:

--Vertebral arteries: Normal

--Inferior cerebellar arteries: Normal.

--Basilar artery: Normal.

--Superior cerebellar arteries: Normal.

--Posterior cerebral arteries: Mild left P1-2 junction stenosis.

ANTERIOR CIRCULATION:

--Intracranial internal carotid arteries: Normal.

--Anterior cerebral arteries (ACA): Normal.

--Middle cerebral arteries (MCA): Normal.

ANATOMIC VARIANTS: None
IMPRESSION: 1. No acute intracranial abnormality.
2. Unchanged appearance of multiple chronic microhemorrhages and
chronic small vessel disease.
3. Mild left P1-2 junction stenosis.  Otherwise normal MRA.

## 2021-10-01 IMAGING — MR MR HEAD W/O CM
12 of 13 series · 44 of 48 positions shown · non-contrast
Comparison: None.

CLINICAL DATA: Slurred speech and abnormal gait

EXAM:
MRI HEAD WITHOUT CONTRAST
MRA HEAD WITHOUT CONTRAST
TECHNIQUE: Multiplanar, multiecho pulse sequences of the brain and surrounding
structures were obtained without intravenous contrast. Angiographic
images of the head were obtained using MRA technique without
contrast.

[Series 5: DWI · axial · 3.0mm · 0.88mm/px · z∈[-114,+20]mm · 8 of 96 slices shown (1 of 4)]
[im 1/96]
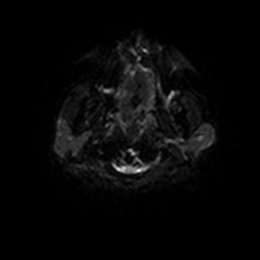
[im 14/96]
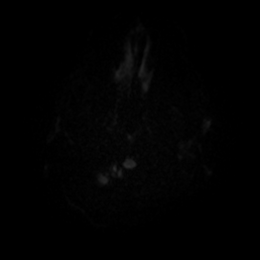
[im 28/96]
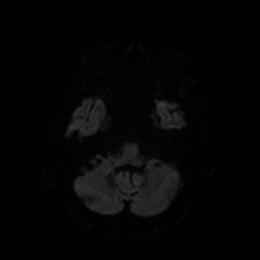
[im 41/96]
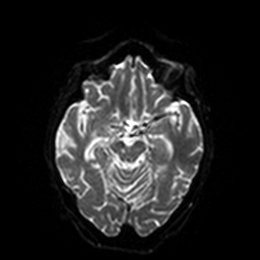
[im 55/96]
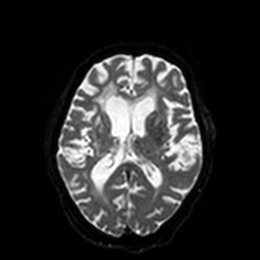
[im 68/96]
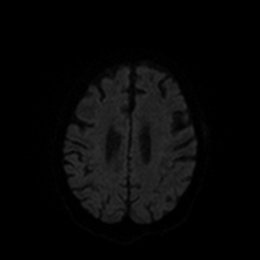
[im 82/96]
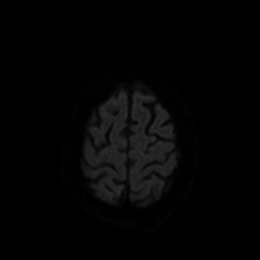
[im 96/96]
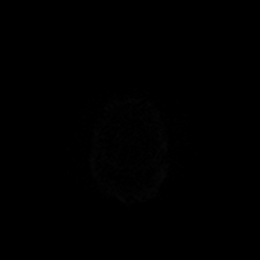

[Series 6: DWI · axial · 3.0mm · 0.88mm/px · z∈[-114,+20]mm · 4 of 48 slices shown (2 of 4)]
[im 1/48]
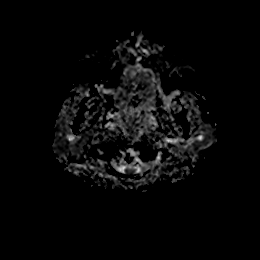
[im 16/48]
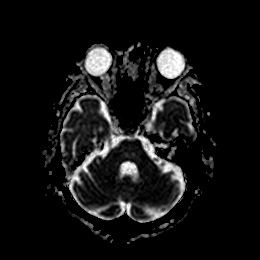
[im 32/48]
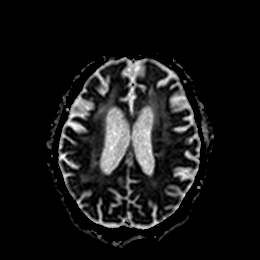
[im 48/48]
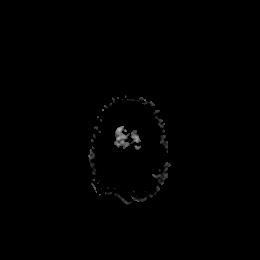

[Series 7: DWI · coronal · 4.0mm · 0.88mm/px · 5 of 66 slices shown (3 of 4)]
[im 1/66]
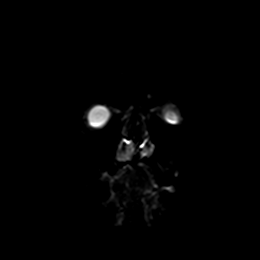
[im 17/66]
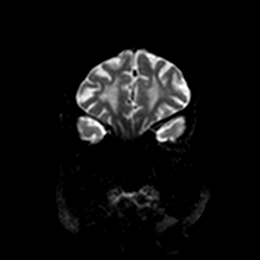
[im 33/66]
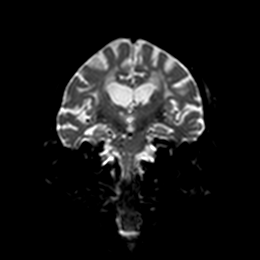
[im 49/66]
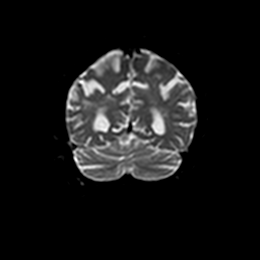
[im 66/66]
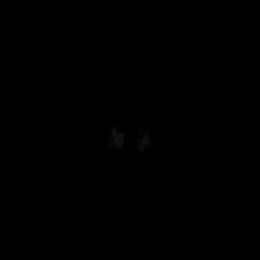

[Series 8: DWI · coronal · 4.0mm · 0.88mm/px · 3 of 33 slices shown (4 of 4)]
[im 1/33]
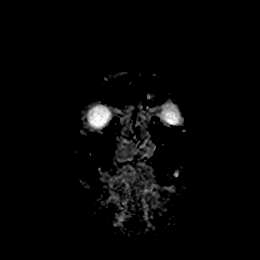
[im 17/33]
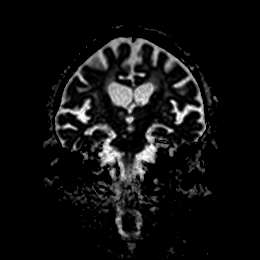
[im 33/33]
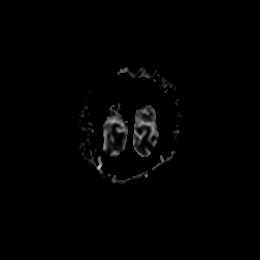

[Series 9: T1 · sagittal · 5.0mm · 0.72mm/px · 2 of 25 slices shown]
[im 1/25]
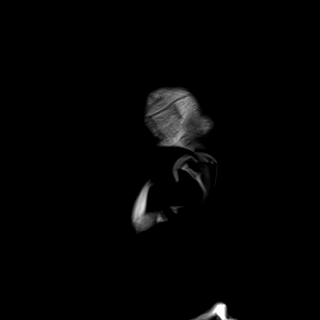
[im 25/25]
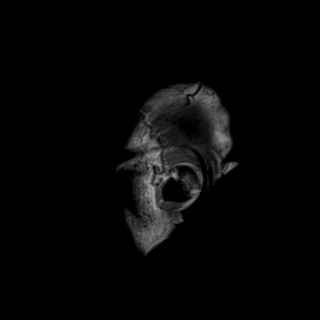

[Series 10: T2 · axial · 5.0mm · 0.72mm/px · z∈[-113,+24]mm · 2 of 25 slices shown (1 of 2)]
[im 1/25]
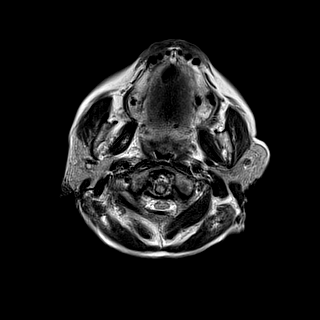
[im 25/25]
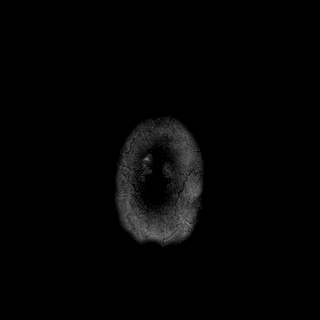

[Series 11: FLAIR · axial · 5.0mm · 0.90mm/px · z∈[-113,+24]mm · 2 of 25 slices shown]
[im 1/25]
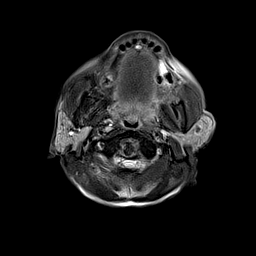
[im 25/25]
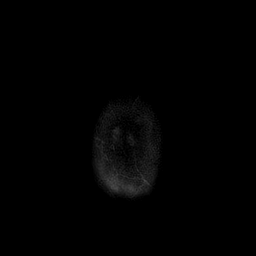

[Series 12: mag_images · axial · 3.0mm · 0.90mm/px · z∈[-120,+25]mm · 4 of 52 slices shown]
[im 1/52]
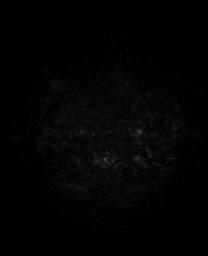
[im 18/52]
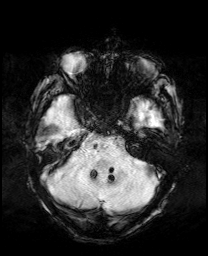
[im 35/52]
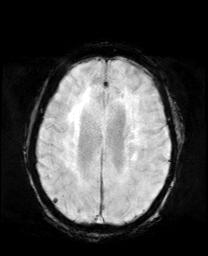
[im 52/52]
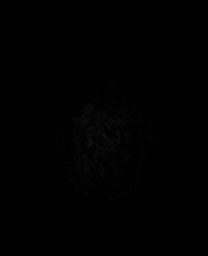

[Series 13: pha_images · axial · 3.0mm · 0.90mm/px · z∈[-120,+20]mm · 4 of 50 slices shown]
[im 1/50]
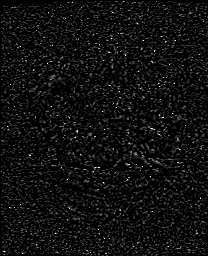
[im 17/50]
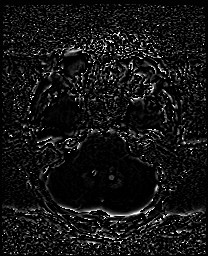
[im 33/50]
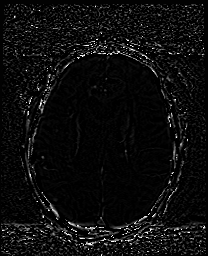
[im 50/50]
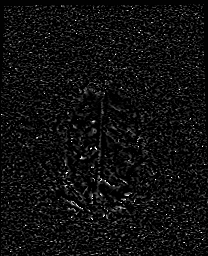

[Series 14: swi_images · axial · 3.0mm · 0.90mm/px · z∈[-120,+25]mm · 4 of 52 slices shown]
[im 1/52]
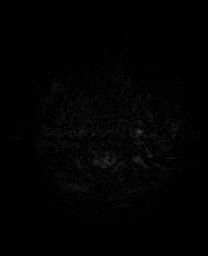
[im 18/52]
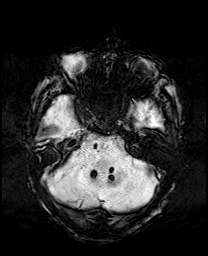
[im 35/52]
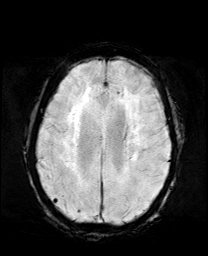
[im 52/52]
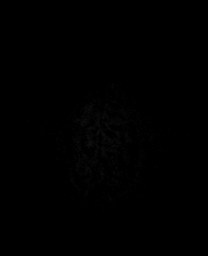

[Series 15: mip_images(sw) · axial · 24.0mm · 0.90mm/px · z∈[-110,+15]mm · 4 of 45 slices shown]
[im 1/45]
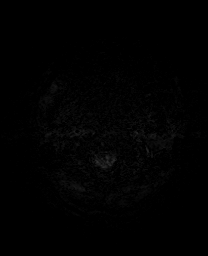
[im 15/45]
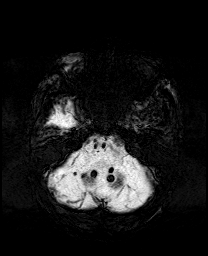
[im 30/45]
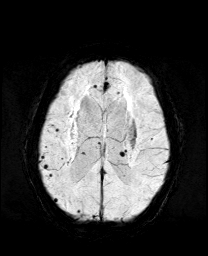
[im 45/45]
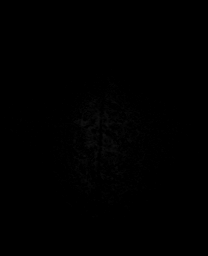

[Series 17: T2 · oblique · 5.0mm · 0.34mm/px · 2 of 29 slices shown (2 of 2)]
[im 1/29]
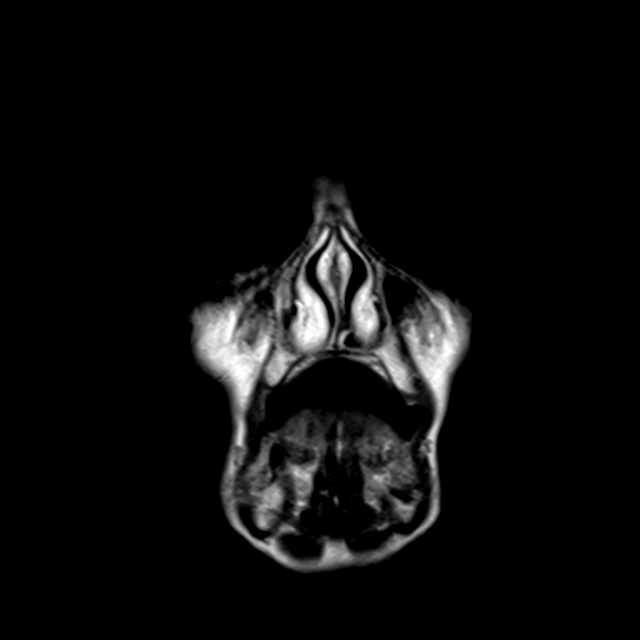
[im 29/29]
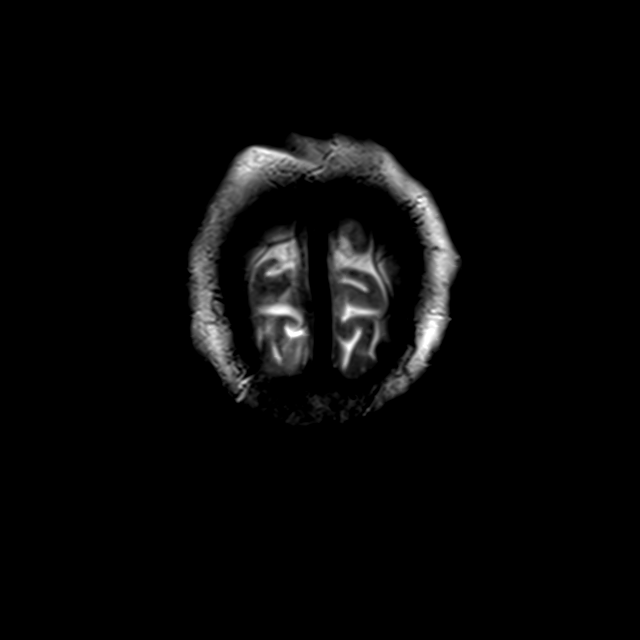

[44 of 48 positions shown; findings below may reference images not displayed]

FINDINGS: MRI HEAD FINDINGS

Brain: No acute infarct, mass effect or extra-axial collection.
Numerous chronic microhemorrhages supratentorially and
infratentorially. Distribution is unchanged. Hyperintense
T2-weighted signal is moderately widespread throughout the white
matter. Generalized volume loss without a clear lobar predilection.
The midline structures are normal.

Vascular: Major flow voids are preserved.

Skull and upper cervical spine: Normal calvarium and skull base.
Visualized upper cervical spine and soft tissues are normal.

Sinuses/Orbits:No paranasal sinus fluid levels or advanced mucosal
thickening. No mastoid or middle ear effusion. Normal orbits.

MRA HEAD FINDINGS

POSTERIOR CIRCULATION:

--Vertebral arteries: Normal

--Inferior cerebellar arteries: Normal.

--Basilar artery: Normal.

--Superior cerebellar arteries: Normal.

--Posterior cerebral arteries: Mild left P1-2 junction stenosis.

ANTERIOR CIRCULATION:

--Intracranial internal carotid arteries: Normal.

--Anterior cerebral arteries (ACA): Normal.

--Middle cerebral arteries (MCA): Normal.

ANATOMIC VARIANTS: None
IMPRESSION: 1. No acute intracranial abnormality.
2. Unchanged appearance of multiple chronic microhemorrhages and
chronic small vessel disease.
3. Mild left P1-2 junction stenosis.  Otherwise normal MRA.

## 2021-10-02 ENCOUNTER — Encounter (HOSPITAL_COMMUNITY): Payer: Self-pay

## 2021-10-02 ENCOUNTER — Emergency Department (HOSPITAL_COMMUNITY)
Admission: EM | Admit: 2021-10-02 | Discharge: 2021-10-02 | Disposition: A | Discharge status: Home / Self Care | Payer: Medicare HMO | Attending: Emergency Medicine | Admitting: Emergency Medicine

## 2021-10-02 ENCOUNTER — Other Ambulatory Visit: Payer: Self-pay

## 2021-10-02 ENCOUNTER — Emergency Department (HOSPITAL_COMMUNITY): Payer: Medicare HMO

## 2021-10-02 DIAGNOSIS — Z79899 Other long term (current) drug therapy: Secondary | ICD-10-CM | POA: Insufficient documentation

## 2021-10-02 DIAGNOSIS — R0602 Shortness of breath: Secondary | ICD-10-CM | POA: Insufficient documentation

## 2021-10-02 DIAGNOSIS — N186 End stage renal disease: Secondary | ICD-10-CM | POA: Diagnosis not present

## 2021-10-02 DIAGNOSIS — Z20822 Contact with and (suspected) exposure to covid-19: Secondary | ICD-10-CM | POA: Insufficient documentation

## 2021-10-02 DIAGNOSIS — I4891 Unspecified atrial fibrillation: Secondary | ICD-10-CM | POA: Diagnosis not present

## 2021-10-02 DIAGNOSIS — I509 Heart failure, unspecified: Secondary | ICD-10-CM | POA: Diagnosis not present

## 2021-10-02 DIAGNOSIS — F29 Unspecified psychosis not due to a substance or known physiological condition: Secondary | ICD-10-CM | POA: Diagnosis not present

## 2021-10-02 DIAGNOSIS — R059 Cough, unspecified: Secondary | ICD-10-CM | POA: Insufficient documentation

## 2021-10-02 DIAGNOSIS — Z87891 Personal history of nicotine dependence: Secondary | ICD-10-CM | POA: Diagnosis not present

## 2021-10-02 DIAGNOSIS — R5383 Other fatigue: Secondary | ICD-10-CM | POA: Diagnosis not present

## 2021-10-02 DIAGNOSIS — I2510 Atherosclerotic heart disease of native coronary artery without angina pectoris: Secondary | ICD-10-CM | POA: Insufficient documentation

## 2021-10-02 DIAGNOSIS — I132 Hypertensive heart and chronic kidney disease with heart failure and with stage 5 chronic kidney disease, or end stage renal disease: Secondary | ICD-10-CM | POA: Insufficient documentation

## 2021-10-02 DIAGNOSIS — Z992 Dependence on renal dialysis: Secondary | ICD-10-CM | POA: Diagnosis not present

## 2021-10-02 LAB — CBC WITH DIFFERENTIAL/PLATELET
Abs Immature Granulocytes: 0.02 10*3/uL (ref 0.00–0.07)
Basophils Absolute: 0 10*3/uL (ref 0.0–0.1)
Basophils Relative: 1 %
Eosinophils Absolute: 0.2 10*3/uL (ref 0.0–0.5)
Eosinophils Relative: 5 %
HCT: 32 % — ABNORMAL LOW (ref 36.0–46.0)
Hemoglobin: 9.9 g/dL — ABNORMAL LOW (ref 12.0–15.0)
Immature Granulocytes: 0 %
Lymphocytes Relative: 31 %
Lymphs Abs: 1.4 10*3/uL (ref 0.7–4.0)
MCH: 29.6 pg (ref 26.0–34.0)
MCHC: 30.9 g/dL (ref 30.0–36.0)
MCV: 95.8 fL (ref 80.0–100.0)
Monocytes Absolute: 0.6 10*3/uL (ref 0.1–1.0)
Monocytes Relative: 12 %
Neutro Abs: 2.3 10*3/uL (ref 1.7–7.7)
Neutrophils Relative %: 51 %
Platelets: 173 10*3/uL (ref 150–400)
RBC: 3.34 MIL/uL — ABNORMAL LOW (ref 3.87–5.11)
RDW: 18.6 % — ABNORMAL HIGH (ref 11.5–15.5)
WBC: 4.6 10*3/uL (ref 4.0–10.5)
nRBC: 0 % (ref 0.0–0.2)

## 2021-10-02 LAB — COMPREHENSIVE METABOLIC PANEL
ALT: 12 U/L (ref 0–44)
AST: 21 U/L (ref 15–41)
Albumin: 2.8 g/dL — ABNORMAL LOW (ref 3.5–5.0)
Alkaline Phosphatase: 129 U/L — ABNORMAL HIGH (ref 38–126)
Anion gap: 13 (ref 5–15)
BUN: 16 mg/dL (ref 8–23)
CO2: 30 mmol/L (ref 22–32)
Calcium: 8.5 mg/dL — ABNORMAL LOW (ref 8.9–10.3)
Chloride: 97 mmol/L — ABNORMAL LOW (ref 98–111)
Creatinine, Ser: 3.83 mg/dL — ABNORMAL HIGH (ref 0.44–1.00)
GFR, Estimated: 12 mL/min — ABNORMAL LOW (ref >60)
Glucose, Bld: 80 mg/dL (ref 70–99)
Potassium: 3.5 mmol/L (ref 3.5–5.1)
Sodium: 140 mmol/L (ref 135–145)
Total Bilirubin: 1 mg/dL (ref 0.3–1.2)
Total Protein: 6.8 g/dL (ref 6.5–8.1)

## 2021-10-02 LAB — BRAIN NATRIURETIC PEPTIDE: B Natriuretic Peptide: 4002.9 pg/mL — ABNORMAL HIGH (ref 0.0–100.0)

## 2021-10-02 LAB — RESP PANEL BY RT-PCR (FLU A&B, COVID) ARPGX2
Influenza A by PCR: NEGATIVE
Influenza B by PCR: NEGATIVE
SARS Coronavirus 2 by RT PCR: NEGATIVE

## 2021-10-02 NOTE — ED Provider Notes (Signed)
[facility] EMERGENCY DEPARTMENT Provider Note   CSN: [id number] Arrival date & time: 10/02/21  5732     History Chief Complaint  Patient presents with   Shortness of Breath    [name] is a 70 y.o. 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  Patient states that she was sleeping when she woke up feeling short of breath.  Shortness of breath is worse with any exertion and better with rest.  She reports associated cough characterizes productive with "creamy sputum." She denies fever, chills, abdominal pain, nausea, vomiting, diarrhea, leg swelling, leg pain, sore throat. She had a normal dialysis run yesterday.  Per EMS, they arrived on scene patient was complaining of shortness of breath but was oxygenating good on room air.  When they attempted to walker she was having mild tachypnea but did not become hypoxic.  No interventions were done prior to arrival in the emergency department.  The history is provided by the patient and the EMS personnel. No language interpreter was used.  Shortness of Breath     Past Medical History:  Diagnosis Date   Adenomatous polyp of colon 10/2010, 2006, 2015   Anemia in CKD (chronic kidney disease) 11/07/2012   s/p blood transfusion.    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  Had some early rejection then stable function for years, then had slow decline of function and went back on hemodialysis in 2012.  Gets HD TTS schedule at [facility] on [facility] still using L forearm AVF.       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[place])    left upper arm   Pulmonary edema 12/2019   Stroke ([place]) 11/2015   TIA   Weight loss, unintentional     Patient Active Problem List   Diagnosis Date Noted   Protein-calorie malnutrition, severe 08/12/2021   Diverticulitis of colon with perforation 08/06/2021   Acute respiratory failure with hypoxia ([place]) 08/06/2021   Major depressive disorder, recurrent episode, moderate ([place]) 02/23/2021   TIA (transient ischemic attack) 02/21/2021   Prolonged QT interval 02/21/2021   NSTEMI (non-ST elevated myocardial infarction) ([place]) 05/14/2020   Volume overload 05/13/2020   Hypotension after procedure 03/16/2020   Chest pain 03/16/2020   Change in mental state 03/15/2020   Ventricular tachycardia 03/15/2020   Seizure ([place]) 03/15/2020   Unresponsive episode 03/15/2020   Pulmonary edema 20/25/4270   Complication of vascular access for dialysis 12/21/2019   Breakdown (mechanical) of surgically created arteriovenous fistula, initial encounter ([place]) 12/18/2019   Weakness 10/11/2019   Aneurysm artery, 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   Dependence on renal dialysis ([place]) 07/24/2019   Iron deficiency anemia, unspecified 06/09/2019   Age-related  osteoporosis without current pathological fracture 04/17/2019   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   Coagulation defect, unspecified ([place]) 04/17/2019   Diarrhea, unspecified 04/17/2019   Encounter for screening for depression 04/17/2019   Headache, unspecified 04/17/2019   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   Pain, unspecified 04/17/2019   Polyp of colon 04/17/2019   Primary generalized (osteo)arthritis  04/17/2019   Pruritus, unspecified 04/17/2019   Pure hypercholesterolemia, unspecified 04/17/2019   Secondary hyperparathyroidism of renal origin ([place]) 04/17/2019   Gastro-esophageal reflux disease without esophagitis 04/17/2019   Essential (primary) hypertension 04/17/2019   Hyperlipidemia, unspecified 04/17/2019   Transient cerebral ischemic attack, unspecified 04/17/2019   Hypoxemia 12/13/2018   Atrial fibrillation (HCC) 03/23/2017   Prolonged Q-T interval on ECG 02/24/2017   Hypokalemia 02/24/2017   Acute ischemic stroke ([place]) 02/23/2017   Malnutrition of moderate degree 12/24/2016   Problem with dialysis access ([place]) 12/21/2016   Acute ischemic colitis ([place]) 05/16/2016   Colitis 05/15/2016   Rectal bleeding 05/15/2016   Neurologic abnormality 11/19/2015   Hyperkalemia 11/19/2015   Anxiety 11/19/2015   Insomnia 11/19/2015   Gait instability    Stroke-like symptom 11/16/2015   Vestibular neuritis 11/16/2015   Stroke (cerebrum) ([place]) 11/16/2015   Dizziness 05/09/2015   Ataxia 05/09/2015   H/O: CVA (cerebrovascular accident) 05/09/2015   Left facial numbness 05/09/2015   Left leg numbness 05/09/2015   Hyperlipidemia    Acute on chronic diastolic heart failure ([place])    Shortness of breath 04/01/2013   HTN (hypertension) 04/01/2013   Cholecystitis, acute 11/07/2012   End-stage renal disease on hemodialysis ([place]) 11/07/2012   Anemia in chronic kidney disease 11/07/2012    Past Surgical History:  Procedure Laterality Date   AV FISTULA PLACEMENT     for dialysis   AV FISTULA PLACEMENT Left 11/22/2015   Procedure: ARTERIOVENOUS (AV) FISTULA CREATION-LEFT BRACHIOCEPHALIC;  Surgeon: Serafina Mitchell, MD;  Location: [facility];  Service: Vascular;  Laterality: Left;   AV FISTULA PLACEMENT Right 03/15/2020   Procedure: INSERTION OF ARTERIOVENOUS (AV) GORE-TEX GRAFT ARM ( BRACHIAL AXILLARY );  Surgeon: Katha Cabal, MD;  Location: [facility];  Service: Vascular;  Laterality: Right;    BACK SURGERY     CERVICAL FUSION     CHOLECYSTECTOMY  12/02/2012   Procedure: LAPAROSCOPIC CHOLECYSTECTOMY WITH INTRAOPERATIVE CHOLANGIOGRAM;  Surgeon: Edward Jolly, MD;  Location: [place] [facility];  Service: General;  Laterality: N/A;   EYE SURGERY Bilateral    cataract surgery   EYE SURGERY Left 2019   laser   HEMATOMA EVACUATION Left 12/24/2016   Procedure: EVACUATION HEMATOMA LEFT UPPER ARM;  Surgeon: Waynetta Sandy, MD;  Location: [place];  Service: Vascular;  Laterality: Left;   I & D EXTREMITY Left 12/31/2016   Procedure: IRRIGATION AND DEBRIDEMENT EXTREMITY;  Surgeon: Angelia Mould, MD;  Location: [place];  Service: Vascular;  Laterality: Left;   INSERTION OF DIALYSIS CATHETER Right 12/24/2016   Procedure: INSERTION OF DIALYSIS CATHETER;  Surgeon: Waynetta Sandy, MD;  Location: [place];  Service: Vascular;  Laterality: Right;   INSERTION OF DIALYSIS CATHETER Right 02/04/2017   Procedure: INSERTION OF DIALYSIS CATHETER;  Surgeon: Waynetta Sandy, MD;  Location: [facility] OR;  Service: Vascular;  Laterality: Right;   KIDNEY TRANSPLANT  1996   PERIPHERAL VASCULAR CATHETERIZATION Left 10/23/2016   Procedure: Fistulagram;  Surgeon: Elam Dutch, MD;  Location: [place] [facility];  Service: Cardiovascular;  Laterality: Left;   PERIPHERAL VASCULAR THROMBECTOMY Right 04/16/2020  Procedure: PERIPHERAL VASCULAR THROMBECTOMY;  Surgeon: Katha Cabal, MD;  Location: [place] [facility];  Service: Cardiovascular;  Laterality: Right;   RESECTION OF ARTERIOVENOUS FISTULA ANEURYSM Left 11/22/2015   Procedure: RESECTION OF LEFT RADIOCEPHALIC FISTULA ANEURYSM ;  Surgeon: Serafina Mitchell, MD;  Location: [place] [facility];  Service: Vascular;  Laterality: Left;   REVISON OF ARTERIOVENOUS FISTULA Left 12/22/2016   Procedure: REVISON OF LEFT ARTERIOVENOUS FISTULA;  Surgeon: Waynetta Sandy, MD;  Location: [place];  Service: Vascular;  Laterality: Left;   REVISON OF ARTERIOVENOUS FISTULA  Left 02/04/2017   Procedure: REVISON OF LEFT UPPER ARM ARTERIOVENOUS FISTULA;  Surgeon: Waynetta Sandy, MD;  Location: [place];  Service: Vascular;  Laterality: Left;   UPPER EXTREMITY ANGIOGRAPHY Bilateral 09/19/2019   Procedure: UPPER EXTREMITY ANGIOGRAPHY;  Surgeon: Katha Cabal, MD;  Location: [place] [facility];  Service: Cardiovascular;  Laterality: Bilateral;     OB History   No obstetric history on file.     Family History  Problem Relation Age of Onset   Colon cancer Brother    Cancer Brother    Coronary artery disease Mother 11   Hyperlipidemia Mother    Hypertension Mother    Stroke Maternal Aunt    Esophageal cancer Neg Hx    Stomach cancer Neg Hx    Rectal cancer Neg Hx     Social History   Tobacco Use   Smoking status: Former    Types: Cigarettes    Quit date: 12/31/1991    Years since quitting: 29.7   Smokeless tobacco: Never  Vaping Use   Vaping Use: Never used  Substance Use Topics   Alcohol use: No    Alcohol/week: 0.0 standard drinks   Drug use: No    Home Medications Prior to Admission medications   Medication Sig Start Date End Date Taking? Authorizing Provider  acetaminophen (TYLENOL) 500 MG tablet Take 1 tablet (500 mg total) by mouth every 6 (six) hours as needed. Patient taking differently: Take 500 mg by mouth every 6 (six) hours as needed for mild pain. 09/05/18   Law, Bea Graff, PA-C  albuterol (VENTOLIN HFA) 108 (90 Base) MCG/ACT inhaler Inhale 2 puffs into the lungs every 6 (six) hours as needed for wheezing or shortness of breath.    [provider]  ALLOPURINOL PO Take 1 tablet by mouth daily as needed (for gout flares).    [provider]  ALPRAZolam Duanne Moron) 0.25 MG tablet Take 1 tablet (0.25 mg total) by mouth daily as needed for anxiety. 08/18/21   Nita Sells, MD  ARTIFICIAL TEARS PF 0.1-0.3 % SOLN Place 1 drop into both eyes 3 (three) times daily as needed (for dryness).    [provider]  atorvastatin (LIPITOR) 80 MG tablet TAKE 1 TABLET (80 MG TOTAL) BY MOUTH DAILY. Patient not taking: Reported on 08/06/2021 02/25/21 02/25/22  Dessa Phi, DO  AURYXIA 1 GM 210 MG(Fe) tablet Take 420 mg by mouth 3 (three) times daily with meals.     [provider]  busPIRone (BUSPAR) 10 MG tablet TAKE 1 TABLET (10 MG TOTAL) BY MOUTH TWO TIMES DAILY. 08/18/21 08/18/22  Nita Sells, MD  cinacalcet (SENSIPAR) 30 MG tablet Take 30 mg by mouth daily. 01/04/20   [provider]  lactulose (CHRONULAC) 10 GM/15ML solution Take 20 g by mouth daily as needed for mild constipation.    [provider]  ondansetron (ZOFRAN) 8 MG tablet Take 8 mg by mouth 2 (  two) times daily as needed for nausea or vomiting.     [provider]  pantoprazole (PROTONIX) 40 MG tablet Take 1 tablet (40 mg total) by mouth daily. 08/18/21   Nita Sells, MD  zolpidem (AMBIEN) 10 MG tablet Take 10 mg by mouth at bedtime. 02/13/21   [provider]    Allergies    Sulfa antibiotics and Adhesive [tape]  Review of Systems   Review of Systems  Respiratory:  Positive for shortness of breath.   All other systems reviewed and are negative.  Physical Exam Updated Vital Signs BP (!) 117/49   Pulse 83   Temp 97.6 F (36.4 C) (Oral)   Resp 17   Ht 5\' 2"  (1.575 m)   Wt 46.3 kg   SpO2 97%   BMI 18.66 kg/m   Physical Exam Vitals and nursing note reviewed.  Constitutional:      General: She is not in acute distress.    Appearance: Normal appearance.  HENT:     Head: Normocephalic and atraumatic.  Eyes:     General:        Right eye: No discharge.        Left eye: No discharge.  Cardiovascular:     Comments: Regular rate and rhythm.  S1/S2 are distinct without any evidence of murmur, rubs, or gallops.  Radial pulses are 2+ bilaterally.  Dorsalis pedis pulses are 2+ bilaterally.  No evidence of pedal edema. Pulmonary:     Comments: Clear to auscultation  bilaterally.  Normal effort.  No respiratory distress.  No evidence of wheezes, rales, or rhonchi heard throughout. Abdominal:     General: Abdomen is flat. Bowel sounds are normal. There is no distension.     Tenderness: There is no abdominal tenderness. There is no guarding or rebound.  Musculoskeletal:        General: Normal range of motion.     Cervical back: Neck supple.  Skin:    General: Skin is warm and dry.     Findings: No rash.     Comments: AV fistula in the left upper extremity.  Neurological:     General: No focal deficit present.     Mental Status: She is alert.  Psychiatric:        Mood and Affect: Mood normal.        Behavior: Behavior normal.    ED Results / Procedures / Treatments   Labs (all labs ordered are listed, but only abnormal results are displayed) Labs Reviewed  COMPREHENSIVE METABOLIC PANEL - Abnormal; Notable for the following components:      Result Value   Chloride 97 (*)    Creatinine, Ser 3.83 (*)    Calcium 8.5 (*)    Albumin 2.8 (*)    Alkaline Phosphatase 129 (*)    GFR, Estimated 12 (*)    All other components within normal limits  CBC WITH DIFFERENTIAL/PLATELET - Abnormal; Notable for the following components:   RBC 3.34 (*)    Hemoglobin 9.9 (*)    HCT 32.0 (*)    RDW 18.6 (*)    All other components within normal limits  BRAIN NATRIURETIC PEPTIDE - Abnormal; Notable for the following components:   B Natriuretic Peptide 4,002.9 (*)    All other components within normal limits  RESP PANEL BY RT-PCR (FLU A&B, COVID) [id number]    EKG None  Radiology DG Chest 2 View  Result Date: 10/02/2021 CLINICAL DATA:  Short of breath and fatigue EXAM:  CHEST - 2 VIEW COMPARISON:  08/06/2021 FINDINGS: Cardiac enlargement with mild vascular congestion. Negative for heart failure or edema. Atherosclerotic aortic arch. Negative for infiltrate or effusion. IMPRESSION: Cardiac enlargement with mild vascular congestion. Negative for edema.  Electronically Signed   By: Franchot Gallo M.D.   On: 10/02/2021 09:49    Procedures Procedures   Medications Ordered in ED Medications - No data to display  ED Course  I have reviewed the triage vital signs and the nursing notes.  Pertinent labs & imaging results that were available during my care of the patient were reviewed by me and considered in my medical decision making (see chart for details).  Clinical Course as of 10/02/21 1302  Thu Oct 02, 2021  0925 I discussed this case with my attending physician who cosigned this note including patient's presenting symptoms, physical exam, and planned diagnostics and interventions. Attending physician stated agreement with plan or made changes to plan which were implemented.   Attending physician assessed patient at bedside.   [CF]    Clinical Course User Index [CF] Hendricks Limes, PA-C   MDM Rules/Calculators/A&P                          TRISTON LISANTI is a 70 y.o. female with history of end-stage renal disease, atrial fibrillation, and heart failure who presents to the emergency department today for further evaluation of shortness of breath.  Clinically she does not appear volume overloaded.  Her presentation is not consistent with acute cardiac etiologies including ACS, CHF, or pericardial effusion.  Her presentation is not consistent with acute respiratory etiologies either including PE, pneumothorax, or infectious etiologies.  CBC reveals chronic ongoing anemia.  CMP shows elevated creatinine in the setting of chronic renal failure.  Potassium is normal.  COVID and influenza was negative. BNP was 4000.  Old records showed it has been chronically elevated in the 3000-4000. Given the clinical picture, I have a low suspicion of CHF at this time. Chest x-ray showed cardiomegaly without any signs of effusion or pulmonary edema. No hypoxia during ambulation.  Given the clinical scenario, believe she is safe to go home.  On  reevaluation, she is feeling better and asking to eat some food.  She has an appointment for hemodialysis tomorrow and I will have her follow-up then.  Strict return precautions are given.  I will all questions or concerns addressed.  She is safe for discharge.   Final Clinical Impression(s) / ED Diagnoses Final diagnoses:  Shortness of breath    Rx / DC Orders ED Discharge Orders     None        Cherrie Gauze 10/02/21 1302    Dorie Rank, MD 10/03/21 [phone]

## 2021-10-02 NOTE — ED Triage Notes (Signed)
Pt BIB [facility] coming from home c/o [facility]. Pt is A7O x4. On arrival of EMS, per EMS pt appeared to be fine in no apparent distress. Pt's oxygen saturation was 96% on RA no evidence of increased work of breathing. They did ambulate pt and stated work of breathing increased slightly but oxygen saturation stayed 96%. PT is a dialysis pt M,W,F.

## 2021-10-02 NOTE — Discharge Instructions (Addendum)
You were seen and evaluated in the emergency department today for evaluation of shortness of breath.  As we discussed, there does not appear to be any emergent causes of your shortness of breath at this time.  Would like for you to follow-up with your hemodialysis team tomorrow and your primary care doctor within the next week.  Please return to the emergency department if you experience worsening shortness of breath, loss of consciousness, coughing up blood, chest pain, or any other concerns you might have.

## 2021-10-02 NOTE — ED Notes (Signed)
Patient transported to X-ray 

## 2021-10-03 DIAGNOSIS — N186 End stage renal disease: Secondary | ICD-10-CM | POA: Diagnosis not present

## 2021-10-03 DIAGNOSIS — N2581 Secondary hyperparathyroidism of renal origin: Secondary | ICD-10-CM | POA: Diagnosis not present

## 2021-10-03 DIAGNOSIS — Z992 Dependence on renal dialysis: Secondary | ICD-10-CM | POA: Diagnosis not present

## 2021-10-04 DIAGNOSIS — K219 Gastro-esophageal reflux disease without esophagitis: Secondary | ICD-10-CM | POA: Diagnosis not present

## 2021-10-04 DIAGNOSIS — I480 Paroxysmal atrial fibrillation: Secondary | ICD-10-CM | POA: Diagnosis not present

## 2021-10-04 DIAGNOSIS — F419 Anxiety disorder, unspecified: Secondary | ICD-10-CM | POA: Diagnosis not present

## 2021-10-04 DIAGNOSIS — N186 End stage renal disease: Secondary | ICD-10-CM | POA: Diagnosis not present

## 2021-10-04 DIAGNOSIS — I132 Hypertensive heart and chronic kidney disease with heart failure and with stage 5 chronic kidney disease, or end stage renal disease: Secondary | ICD-10-CM | POA: Diagnosis not present

## 2021-10-04 DIAGNOSIS — I5032 Chronic diastolic (congestive) heart failure: Secondary | ICD-10-CM | POA: Diagnosis not present

## 2021-10-04 DIAGNOSIS — K5720 Diverticulitis of large intestine with perforation and abscess without bleeding: Secondary | ICD-10-CM | POA: Diagnosis not present

## 2021-10-04 DIAGNOSIS — I722 Aneurysm of renal artery: Secondary | ICD-10-CM | POA: Diagnosis not present

## 2021-10-04 DIAGNOSIS — E785 Hyperlipidemia, unspecified: Secondary | ICD-10-CM | POA: Diagnosis not present

## 2021-10-05 ENCOUNTER — Emergency Department (HOSPITAL_COMMUNITY)
Admission: EM | Admit: 2021-10-05 | Discharge: 2021-10-05 | Disposition: A | Discharge status: Home / Self Care | Payer: Medicare HMO | Attending: Emergency Medicine | Admitting: Emergency Medicine

## 2021-10-05 ENCOUNTER — Emergency Department (HOSPITAL_COMMUNITY): Payer: Medicare HMO

## 2021-10-05 ENCOUNTER — Encounter (HOSPITAL_COMMUNITY): Payer: Self-pay

## 2021-10-05 DIAGNOSIS — Z20822 Contact with and (suspected) exposure to covid-19: Secondary | ICD-10-CM | POA: Diagnosis not present

## 2021-10-05 DIAGNOSIS — I509 Heart failure, unspecified: Secondary | ICD-10-CM | POA: Diagnosis not present

## 2021-10-05 DIAGNOSIS — R197 Diarrhea, unspecified: Secondary | ICD-10-CM | POA: Diagnosis not present

## 2021-10-05 DIAGNOSIS — I120 Hypertensive chronic kidney disease with stage 5 chronic kidney disease or end stage renal disease: Secondary | ICD-10-CM | POA: Diagnosis not present

## 2021-10-05 DIAGNOSIS — R0600 Dyspnea, unspecified: Secondary | ICD-10-CM | POA: Diagnosis not present

## 2021-10-05 DIAGNOSIS — Z992 Dependence on renal dialysis: Secondary | ICD-10-CM | POA: Insufficient documentation

## 2021-10-05 DIAGNOSIS — I132 Hypertensive heart and chronic kidney disease with heart failure and with stage 5 chronic kidney disease, or end stage renal disease: Secondary | ICD-10-CM | POA: Diagnosis not present

## 2021-10-05 DIAGNOSIS — N186 End stage renal disease: Secondary | ICD-10-CM | POA: Diagnosis not present

## 2021-10-05 DIAGNOSIS — Z87891 Personal history of nicotine dependence: Secondary | ICD-10-CM | POA: Insufficient documentation

## 2021-10-05 DIAGNOSIS — R0602 Shortness of breath: Secondary | ICD-10-CM | POA: Diagnosis not present

## 2021-10-05 LAB — BASIC METABOLIC PANEL
Anion gap: 14 (ref 5–15)
BUN: 21 mg/dL (ref 8–23)
CO2: 30 mmol/L (ref 22–32)
Calcium: 8.5 mg/dL — ABNORMAL LOW (ref 8.9–10.3)
Chloride: 95 mmol/L — ABNORMAL LOW (ref 98–111)
Creatinine, Ser: 5.16 mg/dL — ABNORMAL HIGH (ref 0.44–1.00)
GFR, Estimated: 8 mL/min — ABNORMAL LOW (ref >60)
Glucose, Bld: 93 mg/dL (ref 70–99)
Potassium: 3.4 mmol/L — ABNORMAL LOW (ref 3.5–5.1)
Sodium: 139 mmol/L (ref 135–145)

## 2021-10-05 LAB — CBC
HCT: 30.1 % — ABNORMAL LOW (ref 36.0–46.0)
Hemoglobin: 9.5 g/dL — ABNORMAL LOW (ref 12.0–15.0)
MCH: 29.7 pg (ref 26.0–34.0)
MCHC: 31.6 g/dL (ref 30.0–36.0)
MCV: 94.1 fL (ref 80.0–100.0)
Platelets: 190 10*3/uL (ref 150–400)
RBC: 3.2 MIL/uL — ABNORMAL LOW (ref 3.87–5.11)
RDW: 18.9 % — ABNORMAL HIGH (ref 11.5–15.5)
WBC: 4.6 10*3/uL (ref 4.0–10.5)
nRBC: 0 % (ref 0.0–0.2)

## 2021-10-05 LAB — RESP PANEL BY RT-PCR (FLU A&B, COVID) ARPGX2
Influenza A by PCR: NEGATIVE
Influenza B by PCR: NEGATIVE
SARS Coronavirus 2 by RT PCR: NEGATIVE

## 2021-10-05 MED ORDER — LOPERAMIDE HCL 2 MG PO CAPS: 2.0000 mg | ORAL_CAPSULE | Freq: Once | ORAL | Status: DC

## 2021-10-05 NOTE — Discharge Instructions (Addendum)
Please keep appointment for dialysis tomorrow Return to the emergency department if you are having worsening or severe shortness of breath.

## 2021-10-05 NOTE — ED Notes (Signed)
Pt continuously is demanding for recliner.

## 2021-10-05 NOTE — ED Provider Notes (Addendum)
[facility] EMERGENCY DEPARTMENT Provider Note   CSN: [id number] Arrival date & time: 10/05/21  0549     History Chief Complaint  Patient presents with   Shortness of Breath    [name] is a 70 y.o. female.  HPI 71 year old female history of CHF, end-stage renal disease, on dialysis Monday Wednesday Friday reports last dialysis on Friday, presents today complaining of cough and dyspnea.  States she has had cough for several days.  She does not report having any productive cough or fever.  She states that while she was in the waiting room she began having some diarrhea and had multiple episodes of loose stool.  She denies blood or dark stool.  She states her "diverticulitis broke loose".  She reports some mild left lower quadrant discomfort.    Past Medical History:  Diagnosis Date   Adenomatous polyp of colon 10/2010, 2006, 2015   Anemia in CKD (chronic kidney disease) 11/07/2012   s/p blood transfusion.    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  Had some early rejection then stable function for years, then had slow decline of function and went back on hemodialysis in 2012.  Gets HD TTS schedule at [facility] on [facility] still using L forearm AVF.      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[place])    left upper arm   Pulmonary edema 12/2019   Stroke ([place]) 11/2015   TIA   Weight loss, unintentional     Patient  Active Problem List   Diagnosis Date Noted   Protein-calorie malnutrition, severe 08/12/2021   Diverticulitis of colon with perforation 08/06/2021   Acute respiratory failure with hypoxia ([place]) 08/06/2021   Major depressive disorder, recurrent episode, moderate ([place]) 02/23/2021   TIA (transient ischemic attack) 02/21/2021   Prolonged QT interval 02/21/2021   NSTEMI (non-ST elevated myocardial infarction) ([place]) 05/14/2020   Volume overload 05/13/2020   Hypotension after procedure 03/16/2020   Chest pain 03/16/2020   Change in mental state 03/15/2020   Ventricular tachycardia 03/15/2020   Seizure ([place]) 03/15/2020   Unresponsive episode 03/15/2020   Pulmonary edema 87/86/7672   Complication of vascular access for dialysis 12/21/2019   Breakdown (mechanical) of surgically created arteriovenous fistula, initial encounter ([place]) 12/18/2019   Weakness 10/11/2019   Aneurysm artery, 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   Dependence on renal dialysis ([place]) 07/24/2019   Iron deficiency anemia, unspecified 06/09/2019   Age-related osteoporosis without current pathological fracture 04/17/2019   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   Coagulation defect, unspecified ([place]) 04/17/2019   Diarrhea, unspecified 04/17/2019   Encounter for screening for depression 04/17/2019   Headache, unspecified 04/17/2019   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   Pain, unspecified 04/17/2019   Polyp of colon 04/17/2019   Primary generalized (osteo)arthritis 04/17/2019   Pruritus, unspecified 04/17/2019  Pure hypercholesterolemia, unspecified 04/17/2019   Secondary hyperparathyroidism of renal origin ([place]) 04/17/2019   Gastro-esophageal reflux disease without esophagitis 04/17/2019   Essential (primary) hypertension 04/17/2019   Hyperlipidemia, unspecified 04/17/2019   Transient cerebral ischemic attack, unspecified 04/17/2019   Hypoxemia 12/13/2018   Atrial fibrillation ([place]) 03/23/2017    Prolonged Q-T interval on ECG 02/24/2017   Hypokalemia 02/24/2017   Acute ischemic stroke ([place]) 02/23/2017   Malnutrition of moderate degree 12/24/2016   Problem with dialysis access ([place]) 12/21/2016   Acute ischemic colitis ([place]) 05/16/2016   Colitis 05/15/2016   Rectal bleeding 05/15/2016   Neurologic abnormality 11/19/2015   Hyperkalemia 11/19/2015   Anxiety 11/19/2015   Insomnia 11/19/2015   Gait instability    Stroke-like symptom 11/16/2015   Vestibular neuritis 11/16/2015   Stroke (cerebrum) ([place]) 11/16/2015   Dizziness 05/09/2015   Ataxia 05/09/2015   H/O: CVA (cerebrovascular accident) 05/09/2015   Left facial numbness 05/09/2015   Left leg numbness 05/09/2015   Hyperlipidemia    Acute on chronic diastolic heart failure ([place])    Shortness of breath 04/01/2013   HTN (hypertension) 04/01/2013   Cholecystitis, acute 11/07/2012   End-stage renal disease on hemodialysis ([place]) 11/07/2012   Anemia in chronic kidney disease 11/07/2012    Past Surgical History:  Procedure Laterality Date   AV FISTULA PLACEMENT     for dialysis   AV FISTULA PLACEMENT Left 11/22/2015   Procedure: ARTERIOVENOUS (AV) FISTULA CREATION-LEFT BRACHIOCEPHALIC;  Surgeon: Serafina Mitchell, MD;  Location: [place];  Service: Vascular;  Laterality: Left;   AV FISTULA PLACEMENT Right 03/15/2020   Procedure: INSERTION OF ARTERIOVENOUS (AV) GORE-TEX GRAFT ARM ( BRACHIAL AXILLARY );  Surgeon: Katha Cabal, MD;  Location: [facility];  Service: Vascular;  Laterality: Right;   BACK SURGERY     CERVICAL FUSION     CHOLECYSTECTOMY  12/02/2012   Procedure: LAPAROSCOPIC CHOLECYSTECTOMY WITH INTRAOPERATIVE CHOLANGIOGRAM;  Surgeon: Edward Jolly, MD;  Location: [place];  Service: General;  Laterality: N/A;   EYE SURGERY Bilateral    cataract surgery   EYE SURGERY Left 2019   laser   HEMATOMA EVACUATION Left 12/24/2016   Procedure: EVACUATION HEMATOMA LEFT UPPER ARM;  Surgeon: Waynetta Sandy, MD;   Location: [place];  Service: Vascular;  Laterality: Left;   I & D EXTREMITY Left 12/31/2016   Procedure: IRRIGATION AND DEBRIDEMENT EXTREMITY;  Surgeon: Angelia Mould, MD;  Location: [place];  Service: Vascular;  Laterality: Left;   INSERTION OF DIALYSIS CATHETER Right 12/24/2016   Procedure: INSERTION OF DIALYSIS CATHETER;  Surgeon: Waynetta Sandy, MD;  Location: [place];  Service: Vascular;  Laterality: Right;   INSERTION OF DIALYSIS CATHETER Right 02/04/2017   Procedure: INSERTION OF DIALYSIS CATHETER;  Surgeon: Waynetta Sandy, MD;  Location: [facility];  Service: Vascular;  Laterality: Right;   KIDNEY TRANSPLANT  1996   PERIPHERAL VASCULAR CATHETERIZATION Left 10/23/2016   Procedure: Fistulagram;  Surgeon: Elam Dutch, MD;  Location: [place] [facility];  Service: Cardiovascular;  Laterality: Left;   PERIPHERAL VASCULAR THROMBECTOMY Right 04/16/2020   Procedure: PERIPHERAL VASCULAR THROMBECTOMY;  Surgeon: Katha Cabal, MD;  Location: [place] [facility];  Service: Cardiovascular;  Laterality: Right;   RESECTION OF ARTERIOVENOUS FISTULA ANEURYSM Left 11/22/2015   Procedure: RESECTION OF LEFT RADIOCEPHALIC FISTULA ANEURYSM ;  Surgeon: Serafina Mitchell, MD;  Location: [place];  Service: Vascular;  Laterality: Left;   REVISON OF ARTERIOVENOUS FISTULA Left 12/22/2016   Procedure: REVISON OF  LEFT ARTERIOVENOUS FISTULA;  Surgeon: Waynetta Sandy, MD;  Location: [place];  Service: Vascular;  Laterality: Left;   REVISON OF ARTERIOVENOUS FISTULA Left 02/04/2017   Procedure: REVISON OF LEFT UPPER ARM ARTERIOVENOUS FISTULA;  Surgeon: Waynetta Sandy, MD;  Location: [place];  Service: Vascular;  Laterality: Left;   UPPER EXTREMITY ANGIOGRAPHY Bilateral 09/19/2019   Procedure: UPPER EXTREMITY ANGIOGRAPHY;  Surgeon: Katha Cabal, MD;  Location: [place] [facility];  Service: Cardiovascular;  Laterality: Bilateral;     OB History   No obstetric history on file.      Family History  Problem Relation Age of Onset   Colon cancer Brother    Cancer Brother    Coronary artery disease Mother 75   Hyperlipidemia Mother    Hypertension Mother    Stroke Maternal Aunt    Esophageal cancer Neg Hx    Stomach cancer Neg Hx    Rectal cancer Neg Hx     Social History   Tobacco Use   Smoking status: Former    Types: Cigarettes    Quit date: 12/31/1991    Years since quitting: 29.7   Smokeless tobacco: Never  Vaping Use   Vaping Use: Never used  Substance Use Topics   Alcohol use: No    Alcohol/week: 0.0 standard drinks   Drug use: No    Home Medications Prior to Admission medications   Medication Sig Start Date End Date Taking? Authorizing Provider  acetaminophen (TYLENOL) 500 MG tablet Take 1 tablet (500 mg total) by mouth every 6 (six) hours as needed. Patient taking differently: Take 500 mg by mouth every 6 (six) hours as needed for mild pain. 09/05/18   Law, Bea Graff, PA-C  albuterol (VENTOLIN HFA) 108 (90 Base) MCG/ACT inhaler Inhale 2 puffs into the lungs every 6 (six) hours as needed for wheezing or shortness of breath.    [provider]  ALLOPURINOL PO Take 1 tablet by mouth daily as needed (for gout flares).    [provider]  ALPRAZolam Duanne Moron) 0.25 MG tablet Take 1 tablet (0.25 mg total) by mouth daily as needed for anxiety. 08/18/21   Nita Sells, MD  ARTIFICIAL TEARS PF 0.1-0.3 % SOLN Place 1 drop into both eyes 3 (three) times daily as needed (for dryness).    [provider]  atorvastatin (LIPITOR) 80 MG tablet TAKE 1 TABLET (80 MG TOTAL) BY MOUTH DAILY. Patient not taking: Reported on 08/06/2021 02/25/21 02/25/22  Dessa Phi, DO  AURYXIA 1 GM 210 MG(Fe) tablet Take 420 mg by mouth 3 (three) times daily with meals.     [provider]  busPIRone (BUSPAR) 10 MG tablet TAKE 1 TABLET (10 MG TOTAL) BY MOUTH TWO TIMES DAILY. 08/18/21 08/18/22  Nita Sells, MD  cinacalcet (SENSIPAR) 30  MG tablet Take 30 mg by mouth daily. 01/04/20   [provider]  lactulose (CHRONULAC) 10 GM/15ML solution Take 20 g by mouth daily as needed for mild constipation.    [provider]  ondansetron (ZOFRAN) 8 MG tablet Take 8 mg by mouth 2 (two) times daily as needed for nausea or vomiting.     [provider]  pantoprazole (PROTONIX) 40 MG tablet Take 1 tablet (40 mg total) by mouth daily. 08/18/21   Nita Sells, MD  zolpidem (AMBIEN) 10 MG tablet Take 10 mg by mouth at bedtime. 02/13/21   [provider]    Allergies    Sulfa antibiotics and Adhesive [tape]  Review  of Systems   Review of Systems  Constitutional:  Negative for fatigue and fever.  HENT: Negative.    Eyes: Negative.   Respiratory:  Positive for cough and shortness of breath.   Cardiovascular: Negative.   Gastrointestinal: Negative.   Endocrine: Negative.   Genitourinary: Negative.   Musculoskeletal: Negative.   Allergic/Immunologic: Negative.   Neurological: Negative.   Hematological:        Anemia of chronic disease  Psychiatric/Behavioral: Negative.    All other systems reviewed and are negative.  Physical Exam Updated Vital Signs BP (!) 95/57   Pulse 69   Temp 97.9 F (36.6 C)   Resp 14   SpO2 95%   Physical Exam Vitals and nursing note reviewed.  Constitutional:      Appearance: She is well-developed.  HENT:     Head: Normocephalic.     Mouth/Throat:     Mouth: Mucous membranes are moist.  Eyes:     Pupils: Pupils are equal, round, and reactive to light.  Cardiovascular:     Rate and Rhythm: Normal rate and regular rhythm.  Pulmonary:     Effort: Pulmonary effort is normal.     Breath sounds: No decreased breath sounds.  Chest:     Chest wall: Deformity present. No mass.     Comments: Deformity from prior clavicle fracture Musculoskeletal:        General: Normal range of motion.     Cervical back: Normal range of motion.  Skin:    General: Skin is  warm.     Capillary Refill: Capillary refill takes less than 2 seconds.  Neurological:     General: No focal deficit present.     Mental Status: She is alert.  Psychiatric:        Mood and Affect: Mood normal.        Behavior: Behavior normal.    ED Results / Procedures / Treatments   Labs (all labs ordered are listed, but only abnormal results are displayed) Labs Reviewed  CBC - Abnormal; Notable for the following components:      Result Value   RBC 3.20 (*)    Hemoglobin 9.5 (*)    HCT 30.1 (*)    RDW 18.9 (*)    All other components within normal limits  BASIC METABOLIC PANEL - Abnormal; Notable for the following components:   Potassium 3.4 (*)    Chloride 95 (*)    Creatinine, Ser 5.16 (*)    Calcium 8.5 (*)    GFR, Estimated 8 (*)    All other components within normal limits  RESP PANEL BY RT-PCR (FLU A&B, COVID) [id number]    EKG EKG Interpretation  Date/Time:  Sunday October 05 2021 06:02:46 EDT Ventricular Rate:  73 PR Interval:  194 QRS Duration: 82 QT Interval:  468 QTC Calculation: 515 R Axis:   68 Text Interpretation: Normal sinus rhythm Nonspecific ST abnormality Prolonged QT Abnormal ECG Confirmed by Pattricia Boss [id number]) on 10/05/2021 11:16:11 AM  Radiology DG Chest 2 View  Result Date: 10/05/2021 CLINICAL DATA:  Short of breath. EXAM: CHEST - 2 VIEW COMPARISON:  10/02/2021 FINDINGS: Cardiac enlargement. Tortuous aorta with coronary trace interstitial thickening identified compatible with mild edema. No pleural effusion or airspace consolidation. Osseous structures are intact. IMPRESSION: Mild CHF. Electronically Signed   By: Kerby Moors M.D.   On: 10/05/2021 07:01    Procedures Procedures   Medications Ordered in ED Medications  loperamide (IMODIUM) capsule 2 mg (has no administration  in time range)    ED Course  I have reviewed the triage vital signs and the nursing notes.  Pertinent labs & imaging results that were available during my care  of the patient were reviewed by me and considered in my medical decision making (see chart for details).    MDM Rules/Calculators/A&P                         70 year old female on dialysis presents today with some dyspnea.  She shows some mild CHF on her chest x-Kordae Buonocore.  She has been compliant with her dialysis and is due for dialysis tomorrow.  She appears to be stable from that perspective and can be discharged. She had some diarrhea while here in the ED.  She requested Imodium was given a dose of that here.   Borderline hypotension noted here.  However, patient's pulses are good and she is awake and alert.  She has bilateral upper extremity access and the blood pressures have been taken in her left lower leg.  Therefore I doubt the accuracy of these.  Clinically, the patient appears normotensive and well Discussed above with patient.  We have discussed return precautions and need for follow-up she voiced understanding.  Patient with Final Clinical Impression(s) / ED Diagnoses Final diagnoses:  Dyspnea, unspecified type  Diarrhea, unspecified type  ESRD (end stage renal disease) [facility])    Rx / DC Orders ED Discharge Orders     None        Pattricia Boss, MD 10/05/21 1247    Pattricia Boss, MD 10/05/21 1250

## 2021-10-05 NOTE — ED Triage Notes (Signed)
Pt comes via EMS for SOB, EMS pt here on 2L despite her stat being 95% on RA and she wanted to be transported to the hospital to stay on o2. Pt is a dialysis pt MWF, has not missed any treatments

## 2021-10-05 NOTE — ED Notes (Signed)
Pt d/c home per MD order. Discharge summary reviewed with pt, pt verbalizes understanding. No s/s of acute distress noted at discharge. Off unit via [place], reports discharge ride home.

## 2021-10-06 DIAGNOSIS — N186 End stage renal disease: Secondary | ICD-10-CM | POA: Diagnosis not present

## 2021-10-06 DIAGNOSIS — Z992 Dependence on renal dialysis: Secondary | ICD-10-CM | POA: Diagnosis not present

## 2021-10-06 DIAGNOSIS — N2581 Secondary hyperparathyroidism of renal origin: Secondary | ICD-10-CM | POA: Diagnosis not present

## 2021-10-07 DIAGNOSIS — E785 Hyperlipidemia, unspecified: Secondary | ICD-10-CM | POA: Diagnosis not present

## 2021-10-07 DIAGNOSIS — I132 Hypertensive heart and chronic kidney disease with heart failure and with stage 5 chronic kidney disease, or end stage renal disease: Secondary | ICD-10-CM | POA: Diagnosis not present

## 2021-10-07 DIAGNOSIS — K219 Gastro-esophageal reflux disease without esophagitis: Secondary | ICD-10-CM | POA: Diagnosis not present

## 2021-10-07 DIAGNOSIS — I480 Paroxysmal atrial fibrillation: Secondary | ICD-10-CM | POA: Diagnosis not present

## 2021-10-07 DIAGNOSIS — I5032 Chronic diastolic (congestive) heart failure: Secondary | ICD-10-CM | POA: Diagnosis not present

## 2021-10-07 DIAGNOSIS — N186 End stage renal disease: Secondary | ICD-10-CM | POA: Diagnosis not present

## 2021-10-07 DIAGNOSIS — F419 Anxiety disorder, unspecified: Secondary | ICD-10-CM | POA: Diagnosis not present

## 2021-10-07 DIAGNOSIS — K5720 Diverticulitis of large intestine with perforation and abscess without bleeding: Secondary | ICD-10-CM | POA: Diagnosis not present

## 2021-10-07 DIAGNOSIS — I722 Aneurysm of renal artery: Secondary | ICD-10-CM | POA: Diagnosis not present

## 2021-10-08 DIAGNOSIS — N2581 Secondary hyperparathyroidism of renal origin: Secondary | ICD-10-CM | POA: Diagnosis not present

## 2021-10-08 DIAGNOSIS — Z992 Dependence on renal dialysis: Secondary | ICD-10-CM | POA: Diagnosis not present

## 2021-10-08 DIAGNOSIS — N186 End stage renal disease: Secondary | ICD-10-CM | POA: Diagnosis not present

## 2021-10-10 DIAGNOSIS — Z992 Dependence on renal dialysis: Secondary | ICD-10-CM | POA: Diagnosis not present

## 2021-10-10 DIAGNOSIS — N186 End stage renal disease: Secondary | ICD-10-CM | POA: Diagnosis not present

## 2021-10-10 DIAGNOSIS — N2581 Secondary hyperparathyroidism of renal origin: Secondary | ICD-10-CM | POA: Diagnosis not present

## 2021-10-13 ENCOUNTER — Other Ambulatory Visit: Payer: Self-pay

## 2021-10-13 ENCOUNTER — Emergency Department (HOSPITAL_COMMUNITY): Payer: Medicare HMO

## 2021-10-13 ENCOUNTER — Emergency Department (HOSPITAL_COMMUNITY)
Admission: EM | Admit: 2021-10-13 | Discharge: 2021-10-13 | Disposition: A | Discharge status: Home / Self Care | Payer: Medicare HMO | Attending: Emergency Medicine | Admitting: Emergency Medicine

## 2021-10-13 ENCOUNTER — Encounter (HOSPITAL_COMMUNITY): Payer: Self-pay | Admitting: Emergency Medicine

## 2021-10-13 DIAGNOSIS — I509 Heart failure, unspecified: Secondary | ICD-10-CM | POA: Diagnosis not present

## 2021-10-13 DIAGNOSIS — Z87891 Personal history of nicotine dependence: Secondary | ICD-10-CM | POA: Insufficient documentation

## 2021-10-13 DIAGNOSIS — R609 Edema, unspecified: Secondary | ICD-10-CM | POA: Diagnosis not present

## 2021-10-13 DIAGNOSIS — R0600 Dyspnea, unspecified: Secondary | ICD-10-CM

## 2021-10-13 DIAGNOSIS — N186 End stage renal disease: Secondary | ICD-10-CM | POA: Insufficient documentation

## 2021-10-13 DIAGNOSIS — R0602 Shortness of breath: Secondary | ICD-10-CM | POA: Diagnosis not present

## 2021-10-13 DIAGNOSIS — R0902 Hypoxemia: Secondary | ICD-10-CM | POA: Diagnosis not present

## 2021-10-13 DIAGNOSIS — I120 Hypertensive chronic kidney disease with stage 5 chronic kidney disease or end stage renal disease: Secondary | ICD-10-CM | POA: Diagnosis not present

## 2021-10-13 DIAGNOSIS — T8612 Kidney transplant failure: Secondary | ICD-10-CM | POA: Diagnosis not present

## 2021-10-13 DIAGNOSIS — M79672 Pain in left foot: Secondary | ICD-10-CM | POA: Diagnosis not present

## 2021-10-13 DIAGNOSIS — D631 Anemia in chronic kidney disease: Secondary | ICD-10-CM | POA: Insufficient documentation

## 2021-10-13 DIAGNOSIS — I959 Hypotension, unspecified: Secondary | ICD-10-CM | POA: Diagnosis not present

## 2021-10-13 DIAGNOSIS — I132 Hypertensive heart and chronic kidney disease with heart failure and with stage 5 chronic kidney disease, or end stage renal disease: Secondary | ICD-10-CM | POA: Diagnosis not present

## 2021-10-13 DIAGNOSIS — I517 Cardiomegaly: Secondary | ICD-10-CM | POA: Diagnosis not present

## 2021-10-13 DIAGNOSIS — Z992 Dependence on renal dialysis: Secondary | ICD-10-CM | POA: Diagnosis not present

## 2021-10-13 DIAGNOSIS — I2510 Atherosclerotic heart disease of native coronary artery without angina pectoris: Secondary | ICD-10-CM | POA: Insufficient documentation

## 2021-10-13 DIAGNOSIS — J90 Pleural effusion, not elsewhere classified: Secondary | ICD-10-CM | POA: Diagnosis not present

## 2021-10-13 DIAGNOSIS — N2581 Secondary hyperparathyroidism of renal origin: Secondary | ICD-10-CM | POA: Diagnosis not present

## 2021-10-13 LAB — CBC WITH DIFFERENTIAL/PLATELET
Abs Immature Granulocytes: 0.02 10*3/uL (ref 0.00–0.07)
Basophils Absolute: 0 10*3/uL (ref 0.0–0.1)
Basophils Relative: 1 %
Eosinophils Absolute: 0.2 10*3/uL (ref 0.0–0.5)
Eosinophils Relative: 3 %
HCT: 31.8 % — ABNORMAL LOW (ref 36.0–46.0)
Hemoglobin: 10.3 g/dL — ABNORMAL LOW (ref 12.0–15.0)
Immature Granulocytes: 0 %
Lymphocytes Relative: 30 %
Lymphs Abs: 1.7 10*3/uL (ref 0.7–4.0)
MCH: 30.5 pg (ref 26.0–34.0)
MCHC: 32.4 g/dL (ref 30.0–36.0)
MCV: 94.1 fL (ref 80.0–100.0)
Monocytes Absolute: 0.6 10*3/uL (ref 0.1–1.0)
Monocytes Relative: 10 %
Neutro Abs: 3.1 10*3/uL (ref 1.7–7.7)
Neutrophils Relative %: 56 %
Platelets: 167 10*3/uL (ref 150–400)
RBC: 3.38 MIL/uL — ABNORMAL LOW (ref 3.87–5.11)
RDW: 18.2 % — ABNORMAL HIGH (ref 11.5–15.5)
WBC: 5.5 10*3/uL (ref 4.0–10.5)
nRBC: 0 % (ref 0.0–0.2)

## 2021-10-13 LAB — LIPASE, BLOOD: Lipase: 50 U/L (ref 11–51)

## 2021-10-13 LAB — COMPREHENSIVE METABOLIC PANEL
ALT: 12 U/L (ref 0–44)
AST: 21 U/L (ref 15–41)
Albumin: 3 g/dL — ABNORMAL LOW (ref 3.5–5.0)
Alkaline Phosphatase: 126 U/L (ref 38–126)
Anion gap: 15 (ref 5–15)
BUN: 58 mg/dL — ABNORMAL HIGH (ref 8–23)
CO2: 28 mmol/L (ref 22–32)
Calcium: 8.9 mg/dL (ref 8.9–10.3)
Chloride: 95 mmol/L — ABNORMAL LOW (ref 98–111)
Creatinine, Ser: 6.29 mg/dL — ABNORMAL HIGH (ref 0.44–1.00)
GFR, Estimated: 7 mL/min — ABNORMAL LOW (ref >60)
Glucose, Bld: 88 mg/dL (ref 70–99)
Potassium: 4.3 mmol/L (ref 3.5–5.1)
Sodium: 138 mmol/L (ref 135–145)
Total Bilirubin: 1.1 mg/dL (ref 0.3–1.2)
Total Protein: 6.8 g/dL (ref 6.5–8.1)

## 2021-10-13 LAB — BRAIN NATRIURETIC PEPTIDE: B Natriuretic Peptide: 3948.4 pg/mL — ABNORMAL HIGH (ref 0.0–100.0)

## 2021-10-13 LAB — TROPONIN I (HIGH SENSITIVITY)
Troponin I (High Sensitivity): 21 ng/L — ABNORMAL HIGH (ref <18)
Troponin I (High Sensitivity): 21 ng/L — ABNORMAL HIGH (ref <18)

## 2021-10-13 MED ORDER — ACETAMINOPHEN 325 MG PO TABS
650.0000 mg | ORAL_TABLET | Freq: Once | ORAL | Status: AC
  Administered 2021-10-13: 650 mg via ORAL
  Filled 2021-10-13: qty 2

## 2021-10-13 NOTE — ED Triage Notes (Signed)
Patient arrived with EMS from home reports SOB this morning , O2 sat= 100% room air , hemodialysis q Mon/Wed/Fri . She did not miss any treatment .

## 2021-10-13 NOTE — ED Provider Notes (Signed)
[place] EMERGENCY DEPARTMENT Provider Note  CSN: [id number] Arrival date & time: 10/13/21 5462    History Chief Complaint  Patient presents with   Shortness of Breath    [name] is a 70 y.o. female with history of ESRD on HD MWF reports she began feeling SOB during the night. No chest pain, cough or fever. She has not had recent positive Covid test (initial triage said she was Covid+). She is also has had some L foot pain at the site of a boil that drained spontaneously about 2 weeks ago. No swelling or drainage currently, just caused her some pain during the night. She is due for dialysis today at noon.    Past Medical History:  Diagnosis Date   Adenomatous polyp of colon 10/2010, 2006, 2015   Anemia in CKD (chronic kidney disease) 11/07/2012   s/p blood transfusion.    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  Had some early rejection then stable function for years, then had slow decline of function and went back on hemodialysis in 2012.  Gets HD TTS schedule at [facility] on [facility] still using L forearm AVF.      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[place])    left upper arm   Pulmonary edema 12/2019   Stroke ([place]) 11/2015   TIA   Weight loss, unintentional     Past Surgical History:  Procedure Laterality Date   AV FISTULA PLACEMENT     for dialysis   AV FISTULA PLACEMENT Left 11/22/2015   Procedure: ARTERIOVENOUS (AV)  FISTULA CREATION-LEFT BRACHIOCEPHALIC;  Surgeon: Serafina Mitchell, MD;  Location: [place];  Service: Vascular;  Laterality: Left;   AV FISTULA PLACEMENT Right 03/15/2020   Procedure: INSERTION OF ARTERIOVENOUS (AV) GORE-TEX GRAFT ARM ( BRACHIAL AXILLARY );  Surgeon: Katha Cabal, MD;  Location: [facility];  Service: Vascular;  Laterality: Right;   BACK SURGERY     CERVICAL FUSION     CHOLECYSTECTOMY  12/02/2012   Procedure: LAPAROSCOPIC CHOLECYSTECTOMY WITH INTRAOPERATIVE CHOLANGIOGRAM;  Surgeon: Edward Jolly, MD;  Location: [place];  Service: General;  Laterality: N/A;   EYE SURGERY Bilateral    cataract surgery   EYE SURGERY Left 2019   laser   HEMATOMA EVACUATION Left 12/24/2016   Procedure: EVACUATION HEMATOMA LEFT UPPER ARM;  Surgeon: Waynetta Sandy, MD;  Location: [place];  Service: Vascular;  Laterality: Left;   I & D EXTREMITY Left 12/31/2016   Procedure: IRRIGATION AND DEBRIDEMENT EXTREMITY;  Surgeon: Angelia Mould, MD;  Location: [place];  Service: Vascular;  Laterality: Left;   INSERTION OF DIALYSIS CATHETER Right 12/24/2016   Procedure: INSERTION OF DIALYSIS CATHETER;  Surgeon: Waynetta Sandy, MD;  Location: [place];  Service: Vascular;  Laterality: Right;   INSERTION OF DIALYSIS CATHETER Right 02/04/2017  Procedure: INSERTION OF DIALYSIS CATHETER;  Surgeon: Waynetta Sandy, MD;  Location: [place];  Service: Vascular;  Laterality: Right;   [place] Left 10/23/2016   Procedure: Fistulagram;  Surgeon: Elam Dutch, MD;  Location: [place] [facility];  Service: Cardiovascular;  Laterality: Left;   PERIPHERAL VASCULAR THROMBECTOMY Right 04/16/2020   Procedure: PERIPHERAL VASCULAR THROMBECTOMY;  Surgeon: Katha Cabal, MD;  Location: [place] CV [facility];  Service: Cardiovascular;  Laterality: Right;   RESECTION OF ARTERIOVENOUS FISTULA ANEURYSM Left 11/22/2015   Procedure: RESECTION OF LEFT  RADIOCEPHALIC FISTULA ANEURYSM ;  Surgeon: Serafina Mitchell, MD;  Location: [place] [facility];  Service: Vascular;  Laterality: Left;   REVISON OF ARTERIOVENOUS FISTULA Left 12/22/2016   Procedure: REVISON OF LEFT ARTERIOVENOUS FISTULA;  Surgeon: Waynetta Sandy, MD;  Location: [place];  Service: Vascular;  Laterality: Left;   REVISON OF ARTERIOVENOUS FISTULA Left 02/04/2017   Procedure: REVISON OF LEFT UPPER ARM ARTERIOVENOUS FISTULA;  Surgeon: Waynetta Sandy, MD;  Location: [place];  Service: Vascular;  Laterality: Left;   UPPER EXTREMITY ANGIOGRAPHY Bilateral 09/19/2019   Procedure: UPPER EXTREMITY ANGIOGRAPHY;  Surgeon: Katha Cabal, MD;  Location: [place] [facility];  Service: Cardiovascular;  Laterality: Bilateral;    Family History  Problem Relation Age of Onset   Colon cancer Brother    Cancer Brother    Coronary artery disease Mother 59   Hyperlipidemia Mother    Hypertension Mother    Stroke Maternal Aunt    Esophageal cancer Neg Hx    Stomach cancer Neg Hx    Rectal cancer Neg Hx     Social History   Tobacco Use   Smoking status: Former    Types: Cigarettes    Quit date: 12/31/1991    Years since quitting: 29.8   Smokeless tobacco: Never  Vaping Use   Vaping Use: Never used  Substance Use Topics   Alcohol use: No    Alcohol/week: 0.0 standard drinks   Drug use: No     Home Medications Prior to Admission medications   Medication Sig Start Date End Date Taking? Authorizing Provider  acetaminophen (TYLENOL) 500 MG tablet Take 1 tablet (500 mg total) by mouth every 6 (six) hours as needed. Patient taking differently: Take 500 mg by mouth every 6 (six) hours as needed for mild pain. 09/05/18   Law, Bea Graff, PA-C  albuterol (VENTOLIN HFA) 108 (90 Base) MCG/ACT inhaler Inhale 2 puffs into the lungs every 6 (six) hours as needed for wheezing or shortness of breath.    [provider]  ALLOPURINOL PO Take 1 tablet by mouth daily as needed (for gout  flares).    [provider]  ALPRAZolam Duanne Moron) 0.25 MG tablet Take 1 tablet (0.25 mg total) by mouth daily as needed for anxiety. 08/18/21   Nita Sells, MD  ARTIFICIAL TEARS PF 0.1-0.3 % SOLN Place 1 drop into both eyes 3 (three) times daily as needed (for dryness).    [provider]  atorvastatin (LIPITOR) 80 MG tablet TAKE 1 TABLET (80 MG TOTAL) BY MOUTH DAILY. Patient not taking: Reported on 08/06/2021 02/25/21 02/25/22  Dessa Phi, DO  AURYXIA 1 GM 210 MG(Fe) tablet Take 420 mg by mouth 3 (three) times daily with meals.     [provider]  busPIRone (BUSPAR) 10 MG tablet TAKE 1 TABLET (10 MG TOTAL) BY MOUTH TWO TIMES DAILY. 08/18/21 08/18/22  Nita Sells, MD  cinacalcet (SENSIPAR) 30 MG tablet Take 30 mg by mouth daily. 01/04/20   [provider]  lactulose (CHRONULAC) 10 GM/15ML solution Take 20 g by mouth daily as needed for mild constipation.    [provider]  ondansetron (ZOFRAN) 8 MG tablet Take 8 mg by mouth 2 (two) times daily as needed for nausea or vomiting.     [provider]  pantoprazole (PROTONIX) 40 MG tablet Take 1 tablet (40 mg total) by mouth daily. 08/18/21   Nita Sells, MD  zolpidem (AMBIEN) 10 MG tablet Take 10 mg by mouth at bedtime. 02/13/21   [provider]     Allergies    Sulfa antibiotics and Adhesive [tape]   Review of Systems   Review of Systems A comprehensive review of systems was completed and negative except as noted in HPI.    Physical Exam BP (!) 117/101   Pulse 76   Temp 97.7 F (36.5 C) (Oral)   Resp (!) 21   Ht 5\' 2"  (1.575 m)   Wt 48 kg   SpO2 98%   BMI 19.35 kg/m   Physical Exam Vitals and nursing note reviewed.  Constitutional:      Appearance: Normal appearance.  HENT:     Head: Normocephalic and atraumatic.     Nose: Nose normal.     Mouth/Throat:     Mouth: Mucous membranes are moist.  Eyes:     Extraocular Movements: Extraocular  movements intact.     Conjunctiva/sclera: Conjunctivae normal.  Cardiovascular:     Rate and Rhythm: Normal rate.  Pulmonary:     Effort: Pulmonary effort is normal.     Breath sounds: Normal breath sounds.  Abdominal:     General: Abdomen is flat.     Palpations: Abdomen is soft.     Tenderness: There is no abdominal tenderness.  Musculoskeletal:        General: No swelling. Normal range of motion.     Cervical back: Neck supple.     Comments: Old dialysis fistula in RUE without thrill, current dialysis fistula in LUE with thrill, small area of healing abscess on L dorsal foot without signs of secondary infection  Skin:    General: Skin is warm and dry.  Neurological:     General: No focal deficit present.     Mental Status: She is alert.  Psychiatric:        Mood and Affect: Mood normal.     ED Results / Procedures / Treatments   Labs (all labs ordered are listed, but only abnormal results are displayed) Labs Reviewed  CBC WITH DIFFERENTIAL/PLATELET - Abnormal; Notable for the following components:      Result Value   RBC 3.38 (*)    Hemoglobin 10.3 (*)    HCT 31.8 (*)    RDW 18.2 (*)    All other components within normal limits  COMPREHENSIVE METABOLIC PANEL - Abnormal; Notable for the following components:   Chloride 95 (*)    BUN 58 (*)    Creatinine, Ser 6.29 (*)    Albumin 3.0 (*)    GFR, Estimated 7 (*)    All other components within normal limits  BRAIN NATRIURETIC PEPTIDE - Abnormal; Notable for the following components:   B Natriuretic Peptide 3,948.4 (*)    All other components within normal limits  TROPONIN I (HIGH SENSITIVITY) - Abnormal; Notable for the following components:   Troponin I (High Sensitivity) 21 (*)    All other  components within normal limits  LIPASE, BLOOD  TROPONIN I (HIGH SENSITIVITY)    EKG EKG Interpretation  Date/Time:  Monday October 13 2021 04:40:43 EDT Ventricular Rate:  83 PR Interval:  188 QRS Duration: 78 QT  Interval:  438 QTC Calculation: 514 R Axis:   80 Text Interpretation: Normal sinus rhythm Cannot rule out Anterior infarct , age undetermined Prolonged QT Abnormal ECG No significant change since last tracing Confirmed by Calvert Cantor [phone]) on 10/13/2021 7:13:16 AM  Radiology DG Chest 2 View  Result Date: 10/13/2021 CLINICAL DATA:  70 year old female with history of shortness of breath. EXAM: CHEST - 2 VIEW COMPARISON:  Chest x-ray 10/05/2021. FINDINGS: There is cephalization of the pulmonary vasculature and slight indistinctness of the interstitial markings suggestive of mild pulmonary edema. Small bilateral pleural effusions. No pneumothorax. Moderate cardiomegaly. The patient is rotated to the right on today's exam, resulting in distortion of the mediastinal contours and reduced diagnostic sensitivity and specificity for mediastinal pathology. Atherosclerotic calcifications in the thoracic aorta. IMPRESSION: 1. The appearance the chest suggests mild congestive heart failure, as above. 2. Aortic atherosclerosis. Electronically Signed   By: Vinnie Langton M.D.   On: 10/13/2021 05:45    Procedures Procedures  Medications Ordered in the ED Medications  acetaminophen (TYLENOL) tablet 650 mg (has no administration in time range)     MDM Rules/Calculators/A&P MDM Patient with ESRD here with mild SOB, no hypoxia, cough or fever. She had labs done in triage which were at baseline, no hyperkalemia. Her CXR with mild vascular congestion but no overt CHF. She will be given APAP for her residual foot pain. Advised to follow up with PCP for same. Advised to go to dialysis today as scheduled which should help with her SOB.   ED Course  I have reviewed the triage vital signs and the nursing notes.  Pertinent labs & imaging results that were available during my care of the patient were reviewed by me and considered in my medical decision making (see chart for details).     Final Clinical  Impression(s) / ED Diagnoses Final diagnoses:  ESRD on hemodialysis ([place])  Dyspnea, unspecified type  Left foot pain    Rx / DC Orders ED Discharge Orders     None        Truddie Hidden, MD 10/13/21 [phone]

## 2021-10-13 NOTE — Discharge Instructions (Addendum)
Please go to your regularly scheduled dialysis treatment later this morning.

## 2021-10-13 NOTE — ED Provider Notes (Signed)
Emergency Medicine Provider Triage Evaluation Note  [name] , a 70 y.o. female  was evaluated in triage.  Pt complains of ongoing shortness of breath throughout the evening.  Patient reports she is to have dialysis today - normally MWF.  Denies fever, chills.  Reports some chest pain.  Pt reports sleeping with pillows has not helped.  Review of Systems  Positive: SOB, abd distension Negative: Fever, chills  Physical Exam  BP (!) 150/78   Pulse 81   Temp 97.7 F (36.5 C) (Oral)   Resp 17   Ht 5\' 2"  (1.575 m)   Wt 48 kg   SpO2 96%   BMI 19.35 kg/m  Gen:   Awake, no distress   Resp:  Increased WOB MSK:   Moves extremities without difficulty, no peripheral edema Other:  Right arm fistula with palpable thrill, abd distension - soft and nontender, JVD noted  Medical Decision Making  Medically screening exam initiated at 4:48 AM.  Appropriate orders placed.  CARRYE GOLLER was informed that the remainder of the evaluation will be completed by another provider, this initial triage assessment does not replace that evaluation, and the importance of remaining in the ED until their evaluation is complete.  ESRD on dialysis with SOB.   Ladonna Vanorder, Jarrett Soho, PA-C 10/13/21 0451    Orpah Greek, MD 10/13/21 [phone]

## 2021-10-14 DIAGNOSIS — K219 Gastro-esophageal reflux disease without esophagitis: Secondary | ICD-10-CM | POA: Diagnosis not present

## 2021-10-14 DIAGNOSIS — F419 Anxiety disorder, unspecified: Secondary | ICD-10-CM | POA: Diagnosis not present

## 2021-10-14 DIAGNOSIS — I132 Hypertensive heart and chronic kidney disease with heart failure and with stage 5 chronic kidney disease, or end stage renal disease: Secondary | ICD-10-CM | POA: Diagnosis not present

## 2021-10-14 DIAGNOSIS — E785 Hyperlipidemia, unspecified: Secondary | ICD-10-CM | POA: Diagnosis not present

## 2021-10-14 DIAGNOSIS — I5032 Chronic diastolic (congestive) heart failure: Secondary | ICD-10-CM | POA: Diagnosis not present

## 2021-10-14 DIAGNOSIS — I722 Aneurysm of renal artery: Secondary | ICD-10-CM | POA: Diagnosis not present

## 2021-10-14 DIAGNOSIS — I480 Paroxysmal atrial fibrillation: Secondary | ICD-10-CM | POA: Diagnosis not present

## 2021-10-14 DIAGNOSIS — K5720 Diverticulitis of large intestine with perforation and abscess without bleeding: Secondary | ICD-10-CM | POA: Diagnosis not present

## 2021-10-14 DIAGNOSIS — N186 End stage renal disease: Secondary | ICD-10-CM | POA: Diagnosis not present

## 2021-10-15 DIAGNOSIS — N2581 Secondary hyperparathyroidism of renal origin: Secondary | ICD-10-CM | POA: Diagnosis not present

## 2021-10-15 DIAGNOSIS — N186 End stage renal disease: Secondary | ICD-10-CM | POA: Diagnosis not present

## 2021-10-15 DIAGNOSIS — Z992 Dependence on renal dialysis: Secondary | ICD-10-CM | POA: Diagnosis not present

## 2021-10-16 DIAGNOSIS — K219 Gastro-esophageal reflux disease without esophagitis: Secondary | ICD-10-CM | POA: Diagnosis not present

## 2021-10-16 DIAGNOSIS — I132 Hypertensive heart and chronic kidney disease with heart failure and with stage 5 chronic kidney disease, or end stage renal disease: Secondary | ICD-10-CM | POA: Diagnosis not present

## 2021-10-16 DIAGNOSIS — N186 End stage renal disease: Secondary | ICD-10-CM | POA: Diagnosis not present

## 2021-10-16 DIAGNOSIS — K5720 Diverticulitis of large intestine with perforation and abscess without bleeding: Secondary | ICD-10-CM | POA: Diagnosis not present

## 2021-10-16 DIAGNOSIS — F419 Anxiety disorder, unspecified: Secondary | ICD-10-CM | POA: Diagnosis not present

## 2021-10-16 DIAGNOSIS — I722 Aneurysm of renal artery: Secondary | ICD-10-CM | POA: Diagnosis not present

## 2021-10-16 DIAGNOSIS — I480 Paroxysmal atrial fibrillation: Secondary | ICD-10-CM | POA: Diagnosis not present

## 2021-10-16 DIAGNOSIS — E785 Hyperlipidemia, unspecified: Secondary | ICD-10-CM | POA: Diagnosis not present

## 2021-10-16 DIAGNOSIS — I5032 Chronic diastolic (congestive) heart failure: Secondary | ICD-10-CM | POA: Diagnosis not present

## 2021-10-17 ENCOUNTER — Emergency Department (HOSPITAL_COMMUNITY): Payer: Medicare HMO

## 2021-10-17 ENCOUNTER — Emergency Department (HOSPITAL_COMMUNITY)
Admission: EM | Admit: 2021-10-17 | Discharge: 2021-10-17 | Disposition: A | Discharge status: Home / Self Care | Payer: Medicare HMO | Attending: Emergency Medicine | Admitting: Emergency Medicine

## 2021-10-17 ENCOUNTER — Encounter: Payer: Self-pay | Admitting: Nurse Practitioner

## 2021-10-17 DIAGNOSIS — K5792 Diverticulitis of intestine, part unspecified, without perforation or abscess without bleeding: Secondary | ICD-10-CM | POA: Diagnosis not present

## 2021-10-17 DIAGNOSIS — K5732 Diverticulitis of large intestine without perforation or abscess without bleeding: Secondary | ICD-10-CM | POA: Insufficient documentation

## 2021-10-17 DIAGNOSIS — I959 Hypotension, unspecified: Secondary | ICD-10-CM | POA: Diagnosis not present

## 2021-10-17 DIAGNOSIS — Z87891 Personal history of nicotine dependence: Secondary | ICD-10-CM | POA: Insufficient documentation

## 2021-10-17 DIAGNOSIS — Z20822 Contact with and (suspected) exposure to covid-19: Secondary | ICD-10-CM | POA: Insufficient documentation

## 2021-10-17 DIAGNOSIS — I5033 Acute on chronic diastolic (congestive) heart failure: Secondary | ICD-10-CM | POA: Insufficient documentation

## 2021-10-17 DIAGNOSIS — R011 Cardiac murmur, unspecified: Secondary | ICD-10-CM | POA: Insufficient documentation

## 2021-10-17 DIAGNOSIS — I2510 Atherosclerotic heart disease of native coronary artery without angina pectoris: Secondary | ICD-10-CM | POA: Insufficient documentation

## 2021-10-17 DIAGNOSIS — R1084 Generalized abdominal pain: Secondary | ICD-10-CM | POA: Diagnosis not present

## 2021-10-17 DIAGNOSIS — G4489 Other headache syndrome: Secondary | ICD-10-CM | POA: Diagnosis not present

## 2021-10-17 DIAGNOSIS — Z992 Dependence on renal dialysis: Secondary | ICD-10-CM | POA: Insufficient documentation

## 2021-10-17 DIAGNOSIS — I10 Essential (primary) hypertension: Secondary | ICD-10-CM | POA: Diagnosis not present

## 2021-10-17 DIAGNOSIS — I700 Atherosclerosis of aorta: Secondary | ICD-10-CM | POA: Diagnosis not present

## 2021-10-17 DIAGNOSIS — I132 Hypertensive heart and chronic kidney disease with heart failure and with stage 5 chronic kidney disease, or end stage renal disease: Secondary | ICD-10-CM | POA: Insufficient documentation

## 2021-10-17 DIAGNOSIS — K5730 Diverticulosis of large intestine without perforation or abscess without bleeding: Secondary | ICD-10-CM | POA: Diagnosis not present

## 2021-10-17 DIAGNOSIS — N2581 Secondary hyperparathyroidism of renal origin: Secondary | ICD-10-CM | POA: Diagnosis not present

## 2021-10-17 DIAGNOSIS — R0902 Hypoxemia: Secondary | ICD-10-CM | POA: Diagnosis not present

## 2021-10-17 DIAGNOSIS — N186 End stage renal disease: Secondary | ICD-10-CM | POA: Insufficient documentation

## 2021-10-17 DIAGNOSIS — R1030 Lower abdominal pain, unspecified: Secondary | ICD-10-CM | POA: Diagnosis present

## 2021-10-17 LAB — CBC WITH DIFFERENTIAL/PLATELET
Abs Immature Granulocytes: 0.03 10*3/uL (ref 0.00–0.07)
Basophils Absolute: 0 10*3/uL (ref 0.0–0.1)
Basophils Relative: 1 %
Eosinophils Absolute: 0.1 10*3/uL (ref 0.0–0.5)
Eosinophils Relative: 1 %
HCT: 32.3 % — ABNORMAL LOW (ref 36.0–46.0)
Hemoglobin: 9.9 g/dL — ABNORMAL LOW (ref 12.0–15.0)
Immature Granulocytes: 0 %
Lymphocytes Relative: 14 %
Lymphs Abs: 1.2 10*3/uL (ref 0.7–4.0)
MCH: 30 pg (ref 26.0–34.0)
MCHC: 30.7 g/dL (ref 30.0–36.0)
MCV: 97.9 fL (ref 80.0–100.0)
Monocytes Absolute: 0.8 10*3/uL (ref 0.1–1.0)
Monocytes Relative: 10 %
Neutro Abs: 6.3 10*3/uL (ref 1.7–7.7)
Neutrophils Relative %: 74 %
Platelets: 188 10*3/uL (ref 150–400)
RBC: 3.3 MIL/uL — ABNORMAL LOW (ref 3.87–5.11)
RDW: 18.7 % — ABNORMAL HIGH (ref 11.5–15.5)
WBC: 8.4 10*3/uL (ref 4.0–10.5)
nRBC: 0 % (ref 0.0–0.2)

## 2021-10-17 LAB — RESP PANEL BY RT-PCR (FLU A&B, COVID) ARPGX2
Influenza A by PCR: NEGATIVE
Influenza B by PCR: NEGATIVE
SARS Coronavirus 2 by RT PCR: NEGATIVE

## 2021-10-17 LAB — COMPREHENSIVE METABOLIC PANEL
ALT: 11 U/L (ref 0–44)
AST: 26 U/L (ref 15–41)
Albumin: 3 g/dL — ABNORMAL LOW (ref 3.5–5.0)
Alkaline Phosphatase: 127 U/L — ABNORMAL HIGH (ref 38–126)
Anion gap: 15 (ref 5–15)
BUN: 31 mg/dL — ABNORMAL HIGH (ref 8–23)
CO2: 24 mmol/L (ref 22–32)
Calcium: 7.9 mg/dL — ABNORMAL LOW (ref 8.9–10.3)
Chloride: 99 mmol/L (ref 98–111)
Creatinine, Ser: 4.92 mg/dL — ABNORMAL HIGH (ref 0.44–1.00)
GFR, Estimated: 9 mL/min — ABNORMAL LOW (ref >60)
Glucose, Bld: 103 mg/dL — ABNORMAL HIGH (ref 70–99)
Potassium: 4.7 mmol/L (ref 3.5–5.1)
Sodium: 138 mmol/L (ref 135–145)
Total Bilirubin: 1.1 mg/dL (ref 0.3–1.2)
Total Protein: 6.8 g/dL (ref 6.5–8.1)

## 2021-10-17 LAB — LIPASE, BLOOD: Lipase: 49 U/L (ref 11–51)

## 2021-10-17 MED ORDER — IOHEXOL 350 MG/ML SOLN
80.0000 mL | Freq: Once | INTRAVENOUS | Status: AC | PRN
  Administered 2021-10-17: 80 mL via INTRAVENOUS

## 2021-10-17 MED ORDER — AMOXICILLIN-POT CLAVULANATE 500-125 MG PO TABS: 1.0000 | ORAL_TABLET | Freq: Two times a day (BID) | ORAL | 0 refills | Status: DC

## 2021-10-17 NOTE — Discharge Instructions (Addendum)
You have been diagnosed with diverticulitis, please take antibiotic as prescribed as treatment. Please follow up with your doctor for further care. Return if you have any concerns.

## 2021-10-17 NOTE — ED Triage Notes (Signed)
Pt c/o RLQ/LLQ abd pain. Nauseas this morning, abd pain w diarrhea this evening. No meds PTA. Hx diverticulitis, states it feels like normal flare up, advises that abd is more distended than normal. Hx ESRD dialysis MWF, bilateral fistulas, CHF, swelling noted in feet  VSS

## 2021-10-17 NOTE — ED Notes (Signed)
Patient Alert and oriented to baseline. Stable and ambulatory to baseline. Patient verbalized understanding of the discharge instructions.  Patient belongings were taken by the patient.   

## 2021-10-17 NOTE — ED Provider Notes (Signed)
[facility] EMERGENCY DEPARTMENT Provider Note   CSN: [id number] Arrival date & time: 10/17/21  0045     History Chief Complaint  Patient presents with   Abdominal Pain    [name] is a 70 y.o. female.  The history is provided by the patient and medical records. No language interpreter was used.  Abdominal Pain   70 year old female significant history of end-stage renal disease currently on Monday Wednesday Friday dialysis, CHF, CAD AD, diverticular disease Who presents for evaluation of abdominal pain.  Patient endorsed lower abdominal pain that started early this morning.  Pain states felt similar to prior diverticulitis that she had in the past.  Pain is crampy, nonradiating, moderate in severity, she did endorse nausea without any vomiting.  She has had some constipation for the past few days but having some loose stool this morning.  She denies any fever, chills, chest pain, trouble breathing.  Her last dialysis session was 2 days ago.    Past Medical History:  Diagnosis Date   Adenomatous polyp of colon 10/2010, 2006, 2015   Anemia in CKD (chronic kidney disease) 11/07/2012   s/p blood transfusion.    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  Had some early rejection then stable function for years, then had slow decline of function and went back on hemodialysis in 2012.  Gets HD TTS schedule at [facility] on [facility] still using L forearm AVF.      GERD (gastroesophageal reflux disease)    GI bleed 2017   felt to be ischemic colitis, last colo 2015   Headache    Hyperlipidemia    Hypertension    Neurologic gait dysfunction    Neuromuscular disorder (HCC)    neuropathy hand and  legs   Osteoporosis    Pneumonia    Pseudoaneurysm of surgical AV fistula ([place])    left upper arm   Pulmonary edema 12/2019   Stroke ([place]) 11/2015   TIA   Weight loss, unintentional     Patient Active Problem List   Diagnosis Date Noted   Protein-calorie malnutrition, severe 08/12/2021   Diverticulitis of colon with perforation 08/06/2021   Acute respiratory failure with hypoxia ([place]) 08/06/2021   Major depressive disorder, recurrent episode, moderate ([place]) 02/23/2021   TIA (transient ischemic attack) 02/21/2021   Prolonged QT interval 02/21/2021   NSTEMI (non-ST elevated myocardial infarction) ([place]) 05/14/2020   Volume overload 05/13/2020   Hypotension after procedure 03/16/2020   Chest pain 03/16/2020   Change in mental state 03/15/2020   Ventricular tachycardia 03/15/2020   Seizure ([place]) 03/15/2020   Unresponsive episode 03/15/2020   Pulmonary edema 23/53/6144   Complication of vascular access for dialysis 12/21/2019   Breakdown (mechanical) of surgically created arteriovenous fistula, initial encounter ([place]) 12/18/2019   Weakness 10/11/2019   Aneurysm artery, 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   Dependence on renal dialysis ([place]) 07/24/2019   Iron deficiency anemia, unspecified 06/09/2019   Age-related osteoporosis without current pathological fracture 04/17/2019   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   Coagulation defect, unspecified ([place]) 04/17/2019   Diarrhea, unspecified 04/17/2019   Encounter for screening for depression 04/17/2019  Headache, unspecified 04/17/2019   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   Pain, unspecified 04/17/2019   Polyp of colon 04/17/2019   Primary generalized (osteo)arthritis 04/17/2019   Pruritus, unspecified 04/17/2019   Pure hypercholesterolemia, unspecified 04/17/2019   Secondary hyperparathyroidism of renal origin ([place]) 04/17/2019   Gastro-esophageal reflux disease without esophagitis 04/17/2019   Essential  (primary) hypertension 04/17/2019   Hyperlipidemia, unspecified 04/17/2019   Transient cerebral ischemic attack, unspecified 04/17/2019   Hypoxemia 12/13/2018   Atrial fibrillation (HCC) 03/23/2017   Prolonged Q-T interval on ECG 02/24/2017   Hypokalemia 02/24/2017   Acute ischemic stroke ([place]) 02/23/2017   Malnutrition of moderate degree 12/24/2016   Problem with dialysis access ([place]) 12/21/2016   Acute ischemic colitis ([place]) 05/16/2016   Colitis 05/15/2016   Rectal bleeding 05/15/2016   Neurologic abnormality 11/19/2015   Hyperkalemia 11/19/2015   Anxiety 11/19/2015   Insomnia 11/19/2015   Gait instability    Stroke-like symptom 11/16/2015   Vestibular neuritis 11/16/2015   Stroke (cerebrum) ([place]) 11/16/2015   Dizziness 05/09/2015   Ataxia 05/09/2015   H/O: CVA (cerebrovascular accident) 05/09/2015   Left facial numbness 05/09/2015   Left leg numbness 05/09/2015   Hyperlipidemia    Acute on chronic diastolic heart failure ([place])    Shortness of breath 04/01/2013   HTN (hypertension) 04/01/2013   Cholecystitis, acute 11/07/2012   End-stage renal disease on hemodialysis ([place]) 11/07/2012   Anemia in chronic kidney disease 11/07/2012    Past Surgical History:  Procedure Laterality Date   AV FISTULA PLACEMENT     for dialysis   AV FISTULA PLACEMENT Left 11/22/2015   Procedure: ARTERIOVENOUS (AV) FISTULA CREATION-LEFT BRACHIOCEPHALIC;  Surgeon: Serafina Mitchell, MD;  Location: [facility];  Service: Vascular;  Laterality: Left;   AV FISTULA PLACEMENT Right 03/15/2020   Procedure: INSERTION OF ARTERIOVENOUS (AV) GORE-TEX GRAFT ARM ( BRACHIAL AXILLARY );  Surgeon: Katha Cabal, MD;  Location: [facility];  Service: Vascular;  Laterality: Right;   BACK SURGERY     CERVICAL FUSION     CHOLECYSTECTOMY  12/02/2012   Procedure: LAPAROSCOPIC CHOLECYSTECTOMY WITH INTRAOPERATIVE CHOLANGIOGRAM;  Surgeon: Edward Jolly, MD;  Location: [facility];  Service: General;  Laterality: N/A;   EYE  SURGERY Bilateral    cataract surgery   EYE SURGERY Left 2019   laser   HEMATOMA EVACUATION Left 12/24/2016   Procedure: EVACUATION HEMATOMA LEFT UPPER ARM;  Surgeon: Waynetta Sandy, MD;  Location: [facility];  Service: Vascular;  Laterality: Left;   I & D EXTREMITY Left 12/31/2016   Procedure: IRRIGATION AND DEBRIDEMENT EXTREMITY;  Surgeon: Angelia Mould, MD;  Location: [facility];  Service: Vascular;  Laterality: Left;   INSERTION OF DIALYSIS CATHETER Right 12/24/2016   Procedure: INSERTION OF DIALYSIS CATHETER;  Surgeon: Waynetta Sandy, MD;  Location: [facility];  Service: Vascular;  Laterality: Right;   INSERTION OF DIALYSIS CATHETER Right 02/04/2017   Procedure: INSERTION OF DIALYSIS CATHETER;  Surgeon: Waynetta Sandy, MD;  Location: [facility] OR;  Service: Vascular;  Laterality: Right;   KIDNEY TRANSPLANT  1996   PERIPHERAL VASCULAR CATHETERIZATION Left 10/23/2016   Procedure: Fistulagram;  Surgeon: Elam Dutch, MD;  Location: [place] [facility] LAB;  Service: Cardiovascular;  Laterality: Left;   PERIPHERAL VASCULAR THROMBECTOMY Right 04/16/2020   Procedure: PERIPHERAL VASCULAR THROMBECTOMY;  Surgeon: Katha Cabal, MD;  Location: [facility];  Service: Cardiovascular;  Laterality: Right;   RESECTION OF ARTERIOVENOUS FISTULA ANEURYSM Left 11/22/2015   Procedure: RESECTION OF LEFT  RADIOCEPHALIC FISTULA ANEURYSM ;  Surgeon: Serafina Mitchell, MD;  Location: [facility] OR;  Service: Vascular;  Laterality: Left;   REVISON OF ARTERIOVENOUS FISTULA Left 12/22/2016   Procedure: REVISON OF LEFT ARTERIOVENOUS FISTULA;  Surgeon: Waynetta Sandy, MD;  Location: [facility] [place];  Service: Vascular;  Laterality: Left;   REVISON OF ARTERIOVENOUS FISTULA Left 02/04/2017   Procedure: REVISON OF LEFT UPPER ARM ARTERIOVENOUS FISTULA;  Surgeon: Waynetta Sandy, MD;  Location: [place];  Service: Vascular;  Laterality: Left;   UPPER EXTREMITY ANGIOGRAPHY Bilateral 09/19/2019   Procedure:  UPPER EXTREMITY ANGIOGRAPHY;  Surgeon: Katha Cabal, MD;  Location: [facility] LAB;  Service: Cardiovascular;  Laterality: Bilateral;     OB History   No obstetric history on file.     Family History  Problem Relation Age of Onset   Colon cancer Brother    Cancer Brother    Coronary artery disease Mother 47   Hyperlipidemia Mother    Hypertension Mother    Stroke Maternal Aunt    Esophageal cancer Neg Hx    Stomach cancer Neg Hx    Rectal cancer Neg Hx     Social History   Tobacco Use   Smoking status: Former    Types: Cigarettes    Quit date: 12/31/1991    Years since quitting: 29.8   Smokeless tobacco: Never  Vaping Use   Vaping Use: Never used  Substance Use Topics   Alcohol use: No    Alcohol/week: 0.0 standard drinks   Drug use: No    Home Medications Prior to Admission medications   Medication Sig Start Date End Date Taking? Authorizing Provider  acetaminophen (TYLENOL) 500 MG tablet Take 1 tablet (500 mg total) by mouth every 6 (six) hours as needed. Patient taking differently: Take 500 mg by mouth every 6 (six) hours as needed for mild pain. 09/05/18   Law, Bea Graff, PA-C  albuterol (VENTOLIN HFA) 108 (90 Base) MCG/ACT inhaler Inhale 2 puffs into the lungs every 6 (six) hours as needed for wheezing or shortness of breath.    [provider]  ALLOPURINOL PO Take 1 tablet by mouth daily as needed (for gout flares).    [provider]  ALPRAZolam Duanne Moron) 0.25 MG tablet Take 1 tablet (0.25 mg total) by mouth daily as needed for anxiety. 08/18/21   Nita Sells, MD  ARTIFICIAL TEARS PF 0.1-0.3 % SOLN Place 1 drop into both eyes 3 (three) times daily as needed (for dryness).    [provider]  atorvastatin (LIPITOR) 80 MG tablet TAKE 1 TABLET (80 MG TOTAL) BY MOUTH DAILY. Patient not taking: Reported on 08/06/2021 02/25/21 02/25/22  Dessa Phi, DO  AURYXIA 1 GM 210 MG(Fe) tablet Take 420 mg by mouth 3 (three) times  daily with meals.     [provider]  busPIRone (BUSPAR) 10 MG tablet TAKE 1 TABLET (10 MG TOTAL) BY MOUTH TWO TIMES DAILY. 08/18/21 08/18/22  Nita Sells, MD  cinacalcet (SENSIPAR) 30 MG tablet Take 30 mg by mouth daily. 01/04/20   [provider]  lactulose (CHRONULAC) 10 GM/15ML solution Take 20 g by mouth daily as needed for mild constipation.    [provider]  ondansetron (ZOFRAN) 8 MG tablet Take 8 mg by mouth 2 (two) times daily as needed for nausea or vomiting.     [provider]  pantoprazole (PROTONIX) 40 MG tablet Take 1 tablet (40 mg total) by mouth daily. 08/18/21   Nita Sells, MD  zolpidem (AMBIEN) 10 MG tablet Take 10 mg by mouth at bedtime. 02/13/21   [provider]    Allergies    Sulfa antibiotics and Adhesive [tape]  Review of Systems   Review of Systems  Gastrointestinal:  Positive for abdominal pain.  All other systems reviewed and are negative.  Physical Exam Updated Vital Signs BP (!) 132/37 (BP Location: Right Leg)   Pulse 91   Temp 98 F (36.7 C) (Oral)   Resp 18   SpO2 92%   Physical Exam Vitals and nursing note reviewed.  Constitutional:      General: She is not in acute distress.    Appearance: She is well-developed.  HENT:     Head: Atraumatic.  Eyes:     Conjunctiva/sclera: Conjunctivae normal.  Cardiovascular:     Rate and Rhythm: Normal rate and regular rhythm.     Heart sounds: Murmur heard.  Pulmonary:     Effort: Pulmonary effort is normal.  Abdominal:     General: There is distension.     Tenderness: There is abdominal tenderness in the right lower quadrant and suprapubic area.  Musculoskeletal:     Cervical back: Neck supple.  Skin:    General: Skin is warm.     Findings: No rash.  Neurological:     Mental Status: She is alert.  Psychiatric:        Mood and Affect: Mood normal.    ED Results / Procedures / Treatments   Labs (all labs ordered are listed, but only  abnormal results are displayed) Labs Reviewed  COMPREHENSIVE METABOLIC PANEL - Abnormal; Notable for the following components:      Result Value   Glucose, Bld 103 (*)    BUN 31 (*)    Creatinine, Ser 4.92 (*)    Calcium 7.9 (*)    Albumin 3.0 (*)    Alkaline Phosphatase 127 (*)    GFR, Estimated 9 (*)    All other components within normal limits  CBC WITH DIFFERENTIAL/PLATELET - Abnormal; Notable for the following components:   RBC 3.30 (*)    Hemoglobin 9.9 (*)    HCT 32.3 (*)    RDW 18.7 (*)    All other components within normal limits  RESP PANEL BY RT-PCR (FLU A&B, COVID) ARPGX2  LIPASE, BLOOD  URINALYSIS, ROUTINE W REFLEX MICROSCOPIC    EKG None  Radiology CT ABDOMEN PELVIS W CONTRAST  Result Date: 10/17/2021 CLINICAL DATA:  Abdominal pain EXAM: CT ABDOMEN AND PELVIS WITH CONTRAST TECHNIQUE: Multidetector CT imaging of the abdomen and pelvis was performed using the standard protocol following bolus administration of intravenous contrast. CONTRAST:  55mL OMNIPAQUE IOHEXOL 350 MG/ML SOLN COMPARISON:  CT abdomen and pelvis 08/10/2021 FINDINGS: Lower chest: Trace bilateral pleural effusions. Stable chronic appearing nodular densities in the bilateral lower lungs. Cardiomegaly and coronary artery disease. Reflux of contrast noted into the hepatic veins. Hepatobiliary: Liver is normal in size and contour with no suspicious mass identified. Gallbladder is surgically absent. No biliary ductal dilatation appreciated. Pancreas: Unremarkable. No pancreatic ductal dilatation or surrounding inflammatory changes. Spleen: Normal in size without focal abnormality. Adrenals/Urinary Tract: Adrenal glands appears stable and within normal limits. Kidneys are markedly atrophic without definite hydronephrosis. 7 cm cyst of the left kidney. Urinary bladder is nondistended and not well evaluated. Stomach/Bowel: Small hiatal hernia. No evidence of bowel obstruction, free air or pneumatosis. Colonic  diverticulosis. There appears to be wall thickening of the sigmoid colon which may be acute or  chronic. Limited evaluation of bowel due to nondistention. No definite evidence of acute appendicitis, appendix not visualized. Vascular/Lymphatic: Severe atherosclerotic disease. 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 No bulky lymphadenopathy visualized. Reproductive: No suspicious adnexal mass visualized. Other: No frank ascites visualized. Stable amorphous density in the anterior right lower abdomen/pelvis. Musculoskeletal: Postsurgical changes and severe degenerative changes at L4-L5. Changes of renal osteodystrophy. IMPRESSION: 1. Colonic diverticulosis. Wall thickening of the sigmoid colon which appears similar to previous study may represent chronic or possibly mild acute on chronic diverticulitis. 2. Trace pleural effusions. 3. Numerous additional stable and chronic findings as described. Electronically Signed   By: Ofilia Neas M.D.   On: 10/17/2021 08:47    Procedures Procedures   Medications Ordered in ED Medications  iohexol (OMNIPAQUE) 350 MG/ML injection 80 mL (80 mLs Intravenous Contrast Given 10/17/21 0786)    ED Course  I have reviewed the triage vital signs and the nursing notes.  Pertinent labs & imaging results that were available during my care of the patient were reviewed by me and considered in my medical decision making (see chart for details).    MDM Rules/Calculators/A&P                           BP 111/77   Pulse 89   Temp 98 F (36.7 C) (Oral)   Resp 16   SpO2 99%   Final Clinical Impression(s) / ED Diagnoses Final diagnoses:  Diverticulitis    Rx / DC Orders ED Discharge Orders          Ordered    amoxicillin-clavulanate (AUGMENTIN) 500-125 MG tablet  2 times daily        10/17/21 0934           8:22 AM Patient with history of diverticular disease here with lower abdominal pain that started earlier this morning.  She does  have reproducible right lower quadrant tenderness on exam.  She will benefit from an abdominal pelvis CT scan for further assessment.  History is of end-stage renal disease currently on Monday Wednesday Friday dialysis.  She is without any respiratory complaint, labs at baseline.  Care discussed with Dr. Sherry Ruffing.   9:32 AM CT scan demonstrate evidence of wall thickening of the sigmoid colon which appears similar to previous study however may represents chronic or possibly mild acute on chronic diverticulitis.  Since patient's presentation is similar to prior diverticulitis, will treat with Augmentin at an appropriate renal dosing.   Domenic Moras, PA-C 10/17/21 7544    Tegeler, Gwenyth Allegra, MD 10/17/21 [phone]

## 2021-10-17 NOTE — Progress Notes (Signed)
   10/17/21 0810  Clinical Encounter Type  Visited With Patient  Visit Type Initial;Spiritual support  Referral From Other (Comment) (Chaplain rounding)  Consult/Referral To Chaplain   Chaplain rounding. I checked in with NT, Janett Billow, who was at the patient's bedside. This Chaplain provided listening presence as the patient spoke of being in distress because of her abdominal pain. Chaplain offered prayer. Visit ended short for the student PA to attend to the patient.This note was prepared by Jeanine Luz, M.Div..  For questions please contact by phone [phone].

## 2021-10-17 NOTE — ED Provider Notes (Signed)
Emergency Medicine Provider Triage Evaluation Note  [name] , a 70 y.o. female  was evaluated in triage.  Pt complains of abdominal pain.  Reports associated diarrhea.  Feels similar to prior diverticulitis.  ESRD on HD MWF.  Review of Systems  Positive: Abdominal pain, diarrhea Negative: Fever, chills  Physical Exam  There were no vitals taken for this visit. Gen:   Awake, no distress   Resp:  Normal effort  MSK:   Moves extremities without difficulty  Other:    Medical Decision Making  Medically screening exam initiated at 12:51 AM.  Appropriate orders placed.  [name] was informed that the remainder of the evaluation will be completed by another provider, this initial triage assessment does not replace that evaluation, and the importance of remaining in the ED until their evaluation is complete.  Abdominal pain   [name], PA-C 10/17/21 0051    Maudie Flakes, MD 10/17/21 [phone]

## 2021-10-20 ENCOUNTER — Emergency Department (HOSPITAL_COMMUNITY)
Admission: EM | Admit: 2021-10-20 | Discharge: 2021-10-20 | Disposition: A | Discharge status: Home / Self Care | Payer: Medicare HMO | Attending: Emergency Medicine | Admitting: Emergency Medicine

## 2021-10-20 DIAGNOSIS — N186 End stage renal disease: Secondary | ICD-10-CM | POA: Insufficient documentation

## 2021-10-20 DIAGNOSIS — I132 Hypertensive heart and chronic kidney disease with heart failure and with stage 5 chronic kidney disease, or end stage renal disease: Secondary | ICD-10-CM | POA: Insufficient documentation

## 2021-10-20 DIAGNOSIS — K529 Noninfective gastroenteritis and colitis, unspecified: Secondary | ICD-10-CM | POA: Insufficient documentation

## 2021-10-20 DIAGNOSIS — R1084 Generalized abdominal pain: Secondary | ICD-10-CM | POA: Diagnosis not present

## 2021-10-20 DIAGNOSIS — R1032 Left lower quadrant pain: Secondary | ICD-10-CM | POA: Diagnosis present

## 2021-10-20 DIAGNOSIS — I5033 Acute on chronic diastolic (congestive) heart failure: Secondary | ICD-10-CM | POA: Insufficient documentation

## 2021-10-20 DIAGNOSIS — K219 Gastro-esophageal reflux disease without esophagitis: Secondary | ICD-10-CM | POA: Diagnosis not present

## 2021-10-20 DIAGNOSIS — Z992 Dependence on renal dialysis: Secondary | ICD-10-CM | POA: Insufficient documentation

## 2021-10-20 DIAGNOSIS — R0902 Hypoxemia: Secondary | ICD-10-CM | POA: Diagnosis not present

## 2021-10-20 DIAGNOSIS — D631 Anemia in chronic kidney disease: Secondary | ICD-10-CM | POA: Diagnosis not present

## 2021-10-20 DIAGNOSIS — R457 State of emotional shock and stress, unspecified: Secondary | ICD-10-CM | POA: Diagnosis not present

## 2021-10-20 DIAGNOSIS — I2510 Atherosclerotic heart disease of native coronary artery without angina pectoris: Secondary | ICD-10-CM | POA: Diagnosis not present

## 2021-10-20 DIAGNOSIS — Z87891 Personal history of nicotine dependence: Secondary | ICD-10-CM | POA: Diagnosis not present

## 2021-10-20 DIAGNOSIS — R1030 Lower abdominal pain, unspecified: Secondary | ICD-10-CM | POA: Diagnosis not present

## 2021-10-20 DIAGNOSIS — R197 Diarrhea, unspecified: Secondary | ICD-10-CM | POA: Diagnosis not present

## 2021-10-20 LAB — CBC
HCT: 32 % — ABNORMAL LOW (ref 36.0–46.0)
Hemoglobin: 10 g/dL — ABNORMAL LOW (ref 12.0–15.0)
MCH: 30 pg (ref 26.0–34.0)
MCHC: 31.3 g/dL (ref 30.0–36.0)
MCV: 96.1 fL (ref 80.0–100.0)
Platelets: 238 10*3/uL (ref 150–400)
RBC: 3.33 MIL/uL — ABNORMAL LOW (ref 3.87–5.11)
RDW: 18.6 % — ABNORMAL HIGH (ref 11.5–15.5)
WBC: 5.2 10*3/uL (ref 4.0–10.5)
nRBC: 0 % (ref 0.0–0.2)

## 2021-10-20 LAB — COMPREHENSIVE METABOLIC PANEL
ALT: 13 U/L (ref 0–44)
AST: 22 U/L (ref 15–41)
Albumin: 2.9 g/dL — ABNORMAL LOW (ref 3.5–5.0)
Alkaline Phosphatase: 135 U/L — ABNORMAL HIGH (ref 38–126)
Anion gap: 15 (ref 5–15)
BUN: 43 mg/dL — ABNORMAL HIGH (ref 8–23)
CO2: 26 mmol/L (ref 22–32)
Calcium: 8.2 mg/dL — ABNORMAL LOW (ref 8.9–10.3)
Chloride: 98 mmol/L (ref 98–111)
Creatinine, Ser: 7.09 mg/dL — ABNORMAL HIGH (ref 0.44–1.00)
GFR, Estimated: 6 mL/min — ABNORMAL LOW (ref >60)
Glucose, Bld: 91 mg/dL (ref 70–99)
Potassium: 4.1 mmol/L (ref 3.5–5.1)
Sodium: 139 mmol/L (ref 135–145)
Total Bilirubin: 1.1 mg/dL (ref 0.3–1.2)
Total Protein: 6.9 g/dL (ref 6.5–8.1)

## 2021-10-20 LAB — LIPASE, BLOOD: Lipase: 36 U/L (ref 11–51)

## 2021-10-20 MED ORDER — AMOXICILLIN-POT CLAVULANATE 500-125 MG PO TABS
1.0000 | ORAL_TABLET | Freq: Two times a day (BID) | ORAL | Status: DC
  Administered 2021-10-20: 500 mg via ORAL
  Filled 2021-10-20: qty 1

## 2021-10-20 MED ORDER — ONDANSETRON HCL 4 MG/2ML IJ SOLN
4.0000 mg | Freq: Once | INTRAMUSCULAR | Status: AC
  Administered 2021-10-20: 4 mg via INTRAVENOUS
  Filled 2021-10-20: qty 2

## 2021-10-20 NOTE — ED Provider Notes (Signed)
[facility] EMERGENCY DEPARTMENT Provider Note   CSN: [id number] Arrival date & time: 10/20/21  0846     History Chief Complaint  Patient presents with   Diarrhea    [name] is a 70 y.o. female with a history of end-stage renal disease on dialysis Monday Wednesday Friday, coronary disease, failure to thrive, presenting to emergency department with persistent cramping abdominal pain and diarrhea.  The patient was seen in her ER 3 days ago on November 4 for similar symptoms.  She had a CT scan at that time showing colonic thickening consistent with possible infectious colitis or diverticulitis.  She was started on Augmentin 500-125 mg BID x 7 days (renal dosing), but reports that she only took a single dose of that medication the following day.  She says she missed all of her doses yesterday as well as this morning.  She cannot tell me why she missed her dosing, as she denies nausea or vomiting.  She denies fevers or chills.  She reports that she called EMS today because she had a very large bowel movement disorder self in the bed.  She denies frequent bowel movements, but reports that she has a small bowel movement every time she coughs or sneezes.  She lives at home with her mother.  She reports a persistent, knifelike, stabbing pain near her left lower quadrant.  This has been present for the past few days, feels somewhat better after her prior visit to the ED.  It tends to come and go.  It is currently mild to moderate intensity.  She claims she has never had this pain before.  Nothing makes it better or worse.  She does not make urine  She missed dialysis today because she came to the ER instead.  She has attended all of her prior dialysis sessions including on Friday she reports.  She denies shortness of breath or chest pain or pressure.  HPI     Past Medical History:  Diagnosis Date   Adenomatous polyp of colon 10/2010, 2006, 2015   Anemia in CKD (chronic  kidney disease) 11/07/2012   s/p blood transfusion.    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  Had some early rejection then stable function for years, then had slow decline of function and went back on hemodialysis in 2012.  Gets HD TTS schedule at [facility] on [facility] still using L forearm AVF.      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[place])    left upper arm   Pulmonary edema 12/2019   Stroke ([place]) 11/2015   TIA   Weight loss, unintentional     Patient Active Problem List   Diagnosis Date Noted   Protein-calorie malnutrition, severe 08/12/2021   Diverticulitis of colon with perforation 08/06/2021   Acute respiratory failure with hypoxia (  [place]) 08/06/2021   Major depressive disorder, recurrent episode, moderate ([place]) 02/23/2021   TIA (transient ischemic attack) 02/21/2021   Prolonged QT interval 02/21/2021   NSTEMI (non-ST elevated myocardial infarction) ([place]) 05/14/2020   Volume overload 05/13/2020   Hypotension after procedure 03/16/2020   Chest pain 03/16/2020   Change in mental state 03/15/2020   Ventricular tachycardia 03/15/2020   Seizure ([place]) 03/15/2020   Unresponsive episode 03/15/2020   Pulmonary edema 59/93/5701   Complication of vascular access for dialysis 12/21/2019   Breakdown (mechanical) of surgically created arteriovenous fistula, initial encounter ([place]) 12/18/2019   Weakness 10/11/2019   Aneurysm artery, 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   Dependence on renal dialysis ([place]) 07/24/2019   Iron deficiency anemia, unspecified 06/09/2019   Age-related osteoporosis without current pathological fracture 04/17/2019   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   Coagulation defect, unspecified ([place]) 04/17/2019   Diarrhea, unspecified 04/17/2019   Encounter for screening for depression 04/17/2019   Headache, unspecified 04/17/2019   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   Pain, unspecified 04/17/2019   Polyp of colon 04/17/2019   Primary generalized (osteo)arthritis 04/17/2019   Pruritus, unspecified 04/17/2019   Pure hypercholesterolemia, unspecified 04/17/2019   Secondary hyperparathyroidism of renal origin ([place]) 04/17/2019   Gastro-esophageal reflux disease without esophagitis 04/17/2019   Essential (primary) hypertension 04/17/2019   Hyperlipidemia, unspecified 04/17/2019   Transient cerebral ischemic attack, unspecified 04/17/2019   Hypoxemia 12/13/2018   Atrial fibrillation (HCC) 03/23/2017   Prolonged Q-T interval on ECG 02/24/2017   Hypokalemia 02/24/2017   Acute ischemic stroke ([place]) 02/23/2017   Malnutrition of moderate degree 12/24/2016   Problem with dialysis access ([place]) 12/21/2016   Acute ischemic colitis ([place]) 05/16/2016   Colitis 05/15/2016   Rectal bleeding 05/15/2016   Neurologic abnormality 11/19/2015   Hyperkalemia 11/19/2015   Anxiety 11/19/2015   Insomnia 11/19/2015   Gait instability    Stroke-like symptom 11/16/2015   Vestibular neuritis 11/16/2015   Stroke (cerebrum) ([place]) 11/16/2015   Dizziness 05/09/2015   Ataxia 05/09/2015   H/O: CVA (cerebrovascular accident) 05/09/2015   Left facial numbness 05/09/2015   Left leg numbness 05/09/2015   Hyperlipidemia    Acute on chronic diastolic heart failure ([place])    Shortness of breath 04/01/2013   HTN (hypertension) 04/01/2013   Cholecystitis, acute 11/07/2012   End-stage renal disease on hemodialysis ([place])  11/07/2012   Anemia in chronic kidney disease 11/07/2012    Past Surgical History:  Procedure Laterality Date   AV FISTULA PLACEMENT     for dialysis   AV FISTULA PLACEMENT Left 11/22/2015   Procedure: ARTERIOVENOUS (AV) FISTULA CREATION-LEFT BRACHIOCEPHALIC;  Surgeon: Serafina Mitchell, MD;  Location: [place];  Service: Vascular;  Laterality: Left;   AV FISTULA PLACEMENT Right 03/15/2020   Procedure: INSERTION OF ARTERIOVENOUS (AV) GORE-TEX GRAFT ARM ( BRACHIAL AXILLARY );  Surgeon: Katha Cabal, MD;  Location: [facility];  Service: Vascular;  Laterality: Right;   BACK SURGERY     CERVICAL FUSION     CHOLECYSTECTOMY  12/02/2012   Procedure: LAPAROSCOPIC CHOLECYSTECTOMY WITH INTRAOPERATIVE CHOLANGIOGRAM;  Surgeon: Edward Jolly, MD;  Location: [facility] OR;  Service: General;  Laterality: N/A;   EYE SURGERY Bilateral    cataract surgery   EYE SURGERY Left 2019   laser   HEMATOMA EVACUATION Left 12/24/2016   Procedure: EVACUATION HEMATOMA LEFT UPPER ARM;  Surgeon: Waynetta Sandy, MD;  [facility];  Service: Vascular;  Laterality: Left;   I & D EXTREMITY Left 12/31/2016  Procedure: IRRIGATION AND DEBRIDEMENT EXTREMITY;  Surgeon: Angelia Mould, MD;  Location: [place];  Service: Vascular;  Laterality: Left;   INSERTION OF DIALYSIS CATHETER Right 12/24/2016   Procedure: INSERTION OF DIALYSIS CATHETER;  Surgeon: Waynetta Sandy, MD;  Location: [place];  Service: Vascular;  Laterality: Right;   INSERTION OF DIALYSIS CATHETER Right 02/04/2017   Procedure: INSERTION OF DIALYSIS CATHETER;  Surgeon: Waynetta Sandy, MD;  Location: [place];  Service: Vascular;  Laterality: Right;   [place] Left 10/23/2016   Procedure: Fistulagram;  Surgeon: Elam Dutch, MD;  Location: [place] CV LAB;  Service: Cardiovascular;  Laterality: Left;   PERIPHERAL VASCULAR THROMBECTOMY Right 04/16/2020   Procedure: PERIPHERAL  VASCULAR THROMBECTOMY;  Surgeon: Katha Cabal, MD;  Location: [place] CV LAB;  Service: Cardiovascular;  Laterality: Right;   RESECTION OF ARTERIOVENOUS FISTULA ANEURYSM Left 11/22/2015   Procedure: RESECTION OF LEFT RADIOCEPHALIC FISTULA ANEURYSM ;  Surgeon: Serafina Mitchell, MD;  Location: [place] OR;  Service: Vascular;  Laterality: Left;   REVISON OF ARTERIOVENOUS FISTULA Left 12/22/2016   Procedure: REVISON OF LEFT ARTERIOVENOUS FISTULA;  Surgeon: Waynetta Sandy, MD;  Location: [place];  Service: Vascular;  Laterality: Left;   REVISON OF ARTERIOVENOUS FISTULA Left 02/04/2017   Procedure: REVISON OF LEFT UPPER ARM ARTERIOVENOUS FISTULA;  Surgeon: Waynetta Sandy, MD;  Location: [place];  Service: Vascular;  Laterality: Left;   UPPER EXTREMITY ANGIOGRAPHY Bilateral 09/19/2019   Procedure: UPPER EXTREMITY ANGIOGRAPHY;  Surgeon: Katha Cabal, MD;  Location: [place] CV LAB;  Service: Cardiovascular;  Laterality: Bilateral;     OB History   No obstetric history on file.     Family History  Problem Relation Age of Onset   Colon cancer Brother    Cancer Brother    Coronary artery disease Mother 45   Hyperlipidemia Mother    Hypertension Mother    Stroke Maternal Aunt    Esophageal cancer Neg Hx    Stomach cancer Neg Hx    Rectal cancer Neg Hx     Social History   Tobacco Use   Smoking status: Former    Types: Cigarettes    Quit date: 12/31/1991    Years since quitting: 29.8   Smokeless tobacco: Never  Vaping Use   Vaping Use: Never used  Substance Use Topics   Alcohol use: No    Alcohol/week: 0.0 standard drinks   Drug use: No    Home Medications Prior to Admission medications   Medication Sig Start Date End Date Taking? Authorizing Provider  acetaminophen (TYLENOL) 500 MG tablet Take 1 tablet (500 mg total) by mouth every 6 (six) hours as needed. Patient taking differently: Take 500 mg by mouth every 6 (six) hours as needed for mild pain.  09/05/18  Yes Law, Bea Graff, PA-C  ALPRAZolam Duanne Moron) 0.25 MG tablet Take 1 tablet (0.25 mg total) by mouth daily as needed for anxiety. 08/18/21  Yes Nita Sells, MD  amoxicillin-clavulanate (AUGMENTIN) 500-125 MG tablet Take 1 tablet (500 mg total) by mouth 2 (two) times daily. 10/17/21  Yes Domenic Moras, PA-C  atorvastatin (LIPITOR) 80 MG tablet TAKE 1 TABLET (80 MG TOTAL) BY MOUTH DAILY. 02/25/21 02/25/22 Yes Dessa Phi, DO  AURYXIA 1 GM 210 MG(Fe) tablet Take 420 mg by mouth 3 (three) times daily with meals.    Yes [provider]  busPIRone (BUSPAR) 10 MG tablet TAKE 1 TABLET (10 MG  TOTAL) BY MOUTH TWO TIMES DAILY. 08/18/21 08/18/22 Yes Nita Sells, MD  cinacalcet (SENSIPAR) 30 MG tablet Take 30 mg by mouth daily. 01/04/20  Yes [provider]  lactulose (CHRONULAC) 10 GM/15ML solution Take 20 g by mouth daily as needed for mild constipation.   Yes [provider]  ondansetron (ZOFRAN) 8 MG tablet Take 8 mg by mouth 2 (two) times daily as needed for nausea or vomiting.    Yes [provider]  pantoprazole (PROTONIX) 40 MG tablet Take 1 tablet (40 mg total) by mouth daily. 08/18/21  Yes Nita Sells, MD  zolpidem (AMBIEN) 10 MG tablet Take 10 mg by mouth at bedtime. 02/13/21  Yes [provider]  albuterol (VENTOLIN HFA) 108 (90 Base) MCG/ACT inhaler Inhale 2 puffs into the lungs every 6 (six) hours as needed for wheezing or shortness of breath.    [provider]    Allergies    Sulfa antibiotics and Adhesive [tape]  Review of Systems   Review of Systems  Constitutional:  Negative for chills and fever.  Eyes:  Negative for pain and visual disturbance.  Respiratory:  Negative for cough and shortness of breath.   Cardiovascular:  Negative for chest pain and palpitations.  Gastrointestinal:  Positive for abdominal pain and diarrhea. Negative for nausea and vomiting.  Musculoskeletal:  Negative for arthralgias and back  pain.  Skin:  Negative for color change and rash.  Neurological:  Negative for syncope and headaches.  All other systems reviewed and are negative.  Physical Exam Updated Vital Signs BP (!) 153/79   Pulse 87   Temp 98.1 F (36.7 C) (Oral)   Resp (!) 36   SpO2 97%   Physical Exam Constitutional:      General: She is not in acute distress.    Comments: Chronically ill appearing, thin  HENT:     Head: Normocephalic and atraumatic.  Eyes:     Conjunctiva/sclera: Conjunctivae normal.     Pupils: Pupils are equal, round, and reactive to light.  Cardiovascular:     Rate and Rhythm: Normal rate and regular rhythm.     Pulses: Normal pulses.  Pulmonary:     Effort: Pulmonary effort is normal. No respiratory distress.     Breath sounds: Normal breath sounds.  Abdominal:     General: There is no distension.     Tenderness: There is no abdominal tenderness. There is no guarding or rebound.  Musculoskeletal:     Comments: Fistula right arm (active), left arm (old)  Skin:    General: Skin is warm and dry.  Neurological:     General: No focal deficit present.     Mental Status: She is alert and oriented to person, place, and time. Mental status is at baseline.  Psychiatric:        Mood and Affect: Mood normal.        Behavior: Behavior normal.    ED Results / Procedures / Treatments   Labs (all labs ordered are listed, but only abnormal results are displayed) Labs Reviewed  COMPREHENSIVE METABOLIC PANEL - Abnormal; Notable for the following components:      Result Value   BUN 43 (*)    Creatinine, Ser 7.09 (*)    Calcium 8.2 (*)    Albumin 2.9 (*)    Alkaline Phosphatase 135 (*)    GFR, Estimated 6 (*)    All other components within normal limits  CBC - Abnormal; Notable for the following components:  RBC 3.33 (*)    Hemoglobin 10.0 (*)    HCT 32.0 (*)    RDW 18.6 (*)    All other components within normal limits  LIPASE, BLOOD  URINALYSIS, ROUTINE W REFLEX  MICROSCOPIC    EKG None  Radiology No results found.  Procedures Procedures   Medications Ordered in ED Medications  amoxicillin-clavulanate (AUGMENTIN) 500-125 MG per tablet 500 mg (500 mg Oral Given 10/20/21 1744)  ondansetron (ZOFRAN) injection 4 mg (4 mg Intravenous Given 10/20/21 1856)    ED Course  I have reviewed the triage vital signs and the nursing notes.  Pertinent labs & imaging results that were available during my care of the patient were reviewed by me and considered in my medical decision making (see chart for details).  Patient is here with persistent diarrhea, appears to have colitis or diverticulitis on her CT scan 4 days ago and has been not taking her antibiotics.  I did discuss with her the importance of taking antibiotics.  She is tolerating p.o., not having any vomiting issues, so it is still not clear to me why she did not take these medications, but she wants to do so now.  We will give her her next dose of Augmentin.  We will need to recheck her labs to assess her electrolytes and overall hydration status.  She has missed 1 dialysis session but does not present with evidence of volume overload.  She appears overall comfortable on exam, no guarding or rebound tenderness to suggest GI perforation.  She does not show signs of sepsis.  I personally reviewed her prior medical records including her recent ED visit.  I reviewed and interpreted her labs and work-up today, as noted below  Clinical Course as of 10/20/21 2202  Mon Oct 20, 2021  1857 WBC: 5.2 [MT]  1857 WBC downtrending from prior visit [MT]  1924 Labs at baseline, she does not make urine [MT]  2033 Patient was updated regarding her blood work.  Did not see acute indication for hospitalization.  I explained to her again that she needs to take her antibiotics for her infection will not resolve.  She verbalized understanding.  Okay for discharge. [MT]    Clinical Course User Index [MT] Hayk Divis, Carola Rhine, MD    Final Clinical Impression(s) / ED Diagnoses Final diagnoses:  Colitis    Rx / DC Orders ED Discharge Orders     None        Wyvonnia Dusky, MD 10/20/21 2202

## 2021-10-20 NOTE — Discharge Instructions (Addendum)
You were diagnosed on your last ER visit with inflammation in your bowels.  It is very very important that you take your antibiotic as prescribed to clear up this infection.  You told me you had not been taking her doses for the past few days.  We gave you your dose again in the ER today.  Your next dose of this Augmentin is tomorrow morning with breakfast.  You need to finish the full course of antibiotics, even if you are feeling better.  This medication is the only thing that will help fix your diarrhea  You also need to make sure that you attend your next scheduled dialysis session on Wednesday.

## 2021-10-20 NOTE — ED Notes (Signed)
Patients sister is going to come pick patient up from Emergency Department

## 2021-10-20 NOTE — ED Triage Notes (Signed)
eMS stated, she has had diarrhea for a week and was given an antibiotic for 3 days and continues to have diarrhea and unable to keep herself clean. Lives with her mother at home. Suppose to have dialysis today at noon.

## 2021-10-21 DIAGNOSIS — K5792 Diverticulitis of intestine, part unspecified, without perforation or abscess without bleeding: Secondary | ICD-10-CM | POA: Diagnosis not present

## 2021-10-21 DIAGNOSIS — H6120 Impacted cerumen, unspecified ear: Secondary | ICD-10-CM | POA: Diagnosis not present

## 2021-10-21 DIAGNOSIS — I10 Essential (primary) hypertension: Secondary | ICD-10-CM | POA: Diagnosis not present

## 2021-10-21 DIAGNOSIS — N186 End stage renal disease: Secondary | ICD-10-CM | POA: Diagnosis not present

## 2021-10-22 DIAGNOSIS — N2581 Secondary hyperparathyroidism of renal origin: Secondary | ICD-10-CM | POA: Diagnosis not present

## 2021-10-22 DIAGNOSIS — N186 End stage renal disease: Secondary | ICD-10-CM | POA: Diagnosis not present

## 2021-10-22 DIAGNOSIS — Z992 Dependence on renal dialysis: Secondary | ICD-10-CM | POA: Diagnosis not present

## 2021-10-23 DIAGNOSIS — K5720 Diverticulitis of large intestine with perforation and abscess without bleeding: Secondary | ICD-10-CM | POA: Diagnosis not present

## 2021-10-23 DIAGNOSIS — I722 Aneurysm of renal artery: Secondary | ICD-10-CM | POA: Diagnosis not present

## 2021-10-23 DIAGNOSIS — I5032 Chronic diastolic (congestive) heart failure: Secondary | ICD-10-CM | POA: Diagnosis not present

## 2021-10-23 DIAGNOSIS — N186 End stage renal disease: Secondary | ICD-10-CM | POA: Diagnosis not present

## 2021-10-23 DIAGNOSIS — I480 Paroxysmal atrial fibrillation: Secondary | ICD-10-CM | POA: Diagnosis not present

## 2021-10-23 DIAGNOSIS — F419 Anxiety disorder, unspecified: Secondary | ICD-10-CM | POA: Diagnosis not present

## 2021-10-23 DIAGNOSIS — K219 Gastro-esophageal reflux disease without esophagitis: Secondary | ICD-10-CM | POA: Diagnosis not present

## 2021-10-23 DIAGNOSIS — E785 Hyperlipidemia, unspecified: Secondary | ICD-10-CM | POA: Diagnosis not present

## 2021-10-23 DIAGNOSIS — I132 Hypertensive heart and chronic kidney disease with heart failure and with stage 5 chronic kidney disease, or end stage renal disease: Secondary | ICD-10-CM | POA: Diagnosis not present

## 2021-10-24 DIAGNOSIS — E785 Hyperlipidemia, unspecified: Secondary | ICD-10-CM | POA: Diagnosis not present

## 2021-10-24 DIAGNOSIS — K219 Gastro-esophageal reflux disease without esophagitis: Secondary | ICD-10-CM | POA: Diagnosis not present

## 2021-10-24 DIAGNOSIS — N186 End stage renal disease: Secondary | ICD-10-CM | POA: Diagnosis not present

## 2021-10-24 DIAGNOSIS — Z992 Dependence on renal dialysis: Secondary | ICD-10-CM | POA: Diagnosis not present

## 2021-10-24 DIAGNOSIS — I722 Aneurysm of renal artery: Secondary | ICD-10-CM | POA: Diagnosis not present

## 2021-10-24 DIAGNOSIS — I480 Paroxysmal atrial fibrillation: Secondary | ICD-10-CM | POA: Diagnosis not present

## 2021-10-24 DIAGNOSIS — F419 Anxiety disorder, unspecified: Secondary | ICD-10-CM | POA: Diagnosis not present

## 2021-10-24 DIAGNOSIS — N2581 Secondary hyperparathyroidism of renal origin: Secondary | ICD-10-CM | POA: Diagnosis not present

## 2021-10-24 DIAGNOSIS — I132 Hypertensive heart and chronic kidney disease with heart failure and with stage 5 chronic kidney disease, or end stage renal disease: Secondary | ICD-10-CM | POA: Diagnosis not present

## 2021-10-24 DIAGNOSIS — I5032 Chronic diastolic (congestive) heart failure: Secondary | ICD-10-CM | POA: Diagnosis not present

## 2021-10-24 DIAGNOSIS — K5720 Diverticulitis of large intestine with perforation and abscess without bleeding: Secondary | ICD-10-CM | POA: Diagnosis not present

## 2021-10-26 ENCOUNTER — Emergency Department (HOSPITAL_COMMUNITY)
Admission: EM | Admit: 2021-10-26 | Discharge: 2021-10-26 | Disposition: A | Discharge status: Home / Self Care | Payer: Medicare HMO | Attending: Emergency Medicine | Admitting: Emergency Medicine

## 2021-10-26 ENCOUNTER — Encounter (HOSPITAL_COMMUNITY): Payer: Self-pay

## 2021-10-26 ENCOUNTER — Emergency Department (HOSPITAL_COMMUNITY): Payer: Medicare HMO

## 2021-10-26 DIAGNOSIS — R234 Changes in skin texture: Secondary | ICD-10-CM | POA: Diagnosis not present

## 2021-10-26 DIAGNOSIS — N186 End stage renal disease: Secondary | ICD-10-CM | POA: Diagnosis not present

## 2021-10-26 DIAGNOSIS — Z87891 Personal history of nicotine dependence: Secondary | ICD-10-CM | POA: Insufficient documentation

## 2021-10-26 DIAGNOSIS — I5033 Acute on chronic diastolic (congestive) heart failure: Secondary | ICD-10-CM | POA: Diagnosis not present

## 2021-10-26 DIAGNOSIS — Z79899 Other long term (current) drug therapy: Secondary | ICD-10-CM | POA: Diagnosis not present

## 2021-10-26 DIAGNOSIS — Z992 Dependence on renal dialysis: Secondary | ICD-10-CM | POA: Insufficient documentation

## 2021-10-26 DIAGNOSIS — R0602 Shortness of breath: Secondary | ICD-10-CM | POA: Insufficient documentation

## 2021-10-26 DIAGNOSIS — I2510 Atherosclerotic heart disease of native coronary artery without angina pectoris: Secondary | ICD-10-CM | POA: Insufficient documentation

## 2021-10-26 DIAGNOSIS — H6122 Impacted cerumen, left ear: Secondary | ICD-10-CM | POA: Diagnosis not present

## 2021-10-26 DIAGNOSIS — I132 Hypertensive heart and chronic kidney disease with heart failure and with stage 5 chronic kidney disease, or end stage renal disease: Secondary | ICD-10-CM | POA: Diagnosis not present

## 2021-10-26 DIAGNOSIS — I517 Cardiomegaly: Secondary | ICD-10-CM | POA: Diagnosis not present

## 2021-10-26 DIAGNOSIS — J90 Pleural effusion, not elsewhere classified: Secondary | ICD-10-CM | POA: Diagnosis not present

## 2021-10-26 LAB — CBC
HCT: 31.1 % — ABNORMAL LOW (ref 36.0–46.0)
Hemoglobin: 9.7 g/dL — ABNORMAL LOW (ref 12.0–15.0)
MCH: 30.8 pg (ref 26.0–34.0)
MCHC: 31.2 g/dL (ref 30.0–36.0)
MCV: 98.7 fL (ref 80.0–100.0)
Platelets: 220 10*3/uL (ref 150–400)
RBC: 3.15 MIL/uL — ABNORMAL LOW (ref 3.87–5.11)
RDW: 18.7 % — ABNORMAL HIGH (ref 11.5–15.5)
WBC: 5.5 10*3/uL (ref 4.0–10.5)
nRBC: 0 % (ref 0.0–0.2)

## 2021-10-26 LAB — COMPREHENSIVE METABOLIC PANEL
ALT: 12 U/L (ref 0–44)
AST: 20 U/L (ref 15–41)
Albumin: 2.8 g/dL — ABNORMAL LOW (ref 3.5–5.0)
Alkaline Phosphatase: 124 U/L (ref 38–126)
Anion gap: 14 (ref 5–15)
BUN: 32 mg/dL — ABNORMAL HIGH (ref 8–23)
CO2: 28 mmol/L (ref 22–32)
Calcium: 7.4 mg/dL — ABNORMAL LOW (ref 8.9–10.3)
Chloride: 96 mmol/L — ABNORMAL LOW (ref 98–111)
Creatinine, Ser: 5.41 mg/dL — ABNORMAL HIGH (ref 0.44–1.00)
GFR, Estimated: 8 mL/min — ABNORMAL LOW (ref >60)
Glucose, Bld: 92 mg/dL (ref 70–99)
Potassium: 3.6 mmol/L (ref 3.5–5.1)
Sodium: 138 mmol/L (ref 135–145)
Total Bilirubin: 1 mg/dL (ref 0.3–1.2)
Total Protein: 6.8 g/dL (ref 6.5–8.1)

## 2021-10-26 LAB — LIPASE, BLOOD: Lipase: 41 U/L (ref 11–51)

## 2021-10-26 MED ORDER — CARBAMIDE PEROXIDE 6.5 % OT SOLN: 5.0000 [drp] | Freq: Two times a day (BID) | OTIC | 0 refills | Status: DC | PRN

## 2021-10-26 MED ORDER — ALBUTEROL SULFATE HFA 108 (90 BASE) MCG/ACT IN AERS
1.0000 | INHALATION_SPRAY | Freq: Once | RESPIRATORY_TRACT | Status: AC
  Administered 2021-10-26: 1 via RESPIRATORY_TRACT
  Filled 2021-10-26: qty 6.7

## 2021-10-26 MED ORDER — ALBUTEROL SULFATE HFA 108 (90 BASE) MCG/ACT IN AERS: 1.0000 | INHALATION_SPRAY | Freq: Four times a day (QID) | RESPIRATORY_TRACT | 0 refills | Status: DC | PRN

## 2021-10-26 NOTE — ED Provider Notes (Signed)
Emergency Medicine Provider Triage Evaluation Note  [name] , a 70 y.o. female  was evaluated in triage.  Pt complains of SOB that has been ongoing for "weeks". She states that she sometimes has trouble catching her breath; has wanted her PCP to put her on chronic oxygen. Hx of ESRD on MWF dialysis, but missed her Friday session due to being in the ED. No chest pain, fevers, vomiting. Has been compliant with her abx since last visit which she was prescribed for mild diverticulitis.  Review of Systems  Positive: SOB Negative: Vomiting, fevers, chest pain  Physical Exam  BP (!) 140/97   Pulse 91   Temp 97.7 F (36.5 C) (Oral)   Resp 14   SpO2 95%  Gen:   Awake, no distress   Resp:  Normal effort  MSK:   Moves extremities without difficulty  Other:  Chronically ill appearing  Medical Decision Making  Medically screening exam initiated at 5:30 AM.  Appropriate orders placed.  SUMMERLYN FICKEL was informed that the remainder of the evaluation will be completed by another provider, this initial triage assessment does not replace that evaluation, and the importance of remaining in the ED until their evaluation is complete.  SOB, chronic vs acute on chronic - plan for EKG, CXR, labs given ESRD with poor dialysis compliance.   Antonietta Breach, PA-C 10/26/21 0534    Merryl Hacker, MD 10/27/21 [phone]

## 2021-10-26 NOTE — ED Triage Notes (Signed)
Pt comes via [facility] for SOB that started last night, pt is a dialysis pt MWF, oxygen is 97% on RA, wanted to come to the hospital for oxygen.

## 2021-10-26 NOTE — ED Provider Notes (Signed)
[facility] EMERGENCY DEPARTMENT Provider Note   CSN: [id number] Arrival date & time: 10/26/21  0456     History Chief Complaint  Patient presents with  . Shortness of Breath    [name] is a 70 y.o. female.  This is a 70 y.o. female with significant medical history as below, including ESRD on HD MWF, AV fistula LEU, GERD who presents to the ED with complaint of dib. DIB has been ongoing for >3 mos, no acutely worsened. She says that she uses an albuterol inhaler periodically at home but has run out. She has no productive cough, no fevers or chills, no cp. She was recently started on Abx for diverticulitis and is still taking them, feels her abd pain is improving, no hematochezia. No emesis, she is tolerating PO normally. She also is concerned that her left ear has too much ear wax so she has been trying to clear it out with a bobby pin and that she might have a boil on her left foot.   The history is provided by the patient. No language interpreter was used.  Shortness of Breath Associated symptoms: no abdominal pain, no chest pain, no cough, no fever, no headaches, no rash and no vomiting       Past Medical History:  Diagnosis Date  . Adenomatous polyp of colon 10/2010, 2006, 2015  . Anemia in CKD (chronic kidney disease) 11/07/2012   s/p blood transfusion.   . Arthritis   . CAD (coronary artery disease)    "something like that"  . CHF (congestive heart failure) ([place])   . Constipation   . Depression with anxiety   . Diverticula, colon   . 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  Had some early rejection then stable function for years, then had slow decline of function and went back on hemodialysis in 2012.  Gets HD TTS schedule at [facility] on [facility] still using L forearm AVF.     Marland Kitchen GERD (gastroesophageal reflux disease)   . GI bleed 2017   felt to be  ischemic colitis, last colo 2015  . Headache   . Hyperlipidemia   . Hypertension   . Neurologic gait dysfunction   . Neuromuscular disorder (HCC)    neuropathy hand and legs  . Osteoporosis   . Pneumonia   . 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 Pulmonary edema 12/2019  . Stroke [facility]) 11/2015   TIA  . Weight loss, unintentional     Patient Active Problem List   Diagnosis Date Noted  . Protein-calorie malnutrition, severe 08/12/2021  . Diverticulitis of colon with perforation 08/06/2021  . Acute respiratory failure with hypoxia ([place]) 08/06/2021  . Major depressive disorder, recurrent episode, moderate ([place]) 02/23/2021  . TIA (transient ischemic attack) 02/21/2021  . Prolonged QT interval 02/21/2021  . NSTEMI (non-ST elevated myocardial infarction) ([place]) 05/14/2020  . Volume overload 05/13/2020  . Hypotension after procedure 03/16/2020  . Chest pain 03/16/2020  . Change in mental state 03/15/2020  . Ventricular tachycardia 03/15/2020  . Seizure ([place]) 03/15/2020  . Unresponsive episode 03/15/2020  . Pulmonary edema 01/01/2020  . Complication of vascular access for dialysis 12/21/2019  . Breakdown (mechanical) of surgically created arteriovenous fistula, initial encounter ([place]) 12/18/2019  . Weakness 10/11/2019  . 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 Dependence on renal dialysis ([place]) 07/24/2019  .  Iron deficiency anemia, unspecified 06/09/2019  . Age-related osteoporosis without current pathological fracture 04/17/2019  . 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 Coagulation defect, unspecified ([place]) 04/17/2019  . Diarrhea, unspecified 04/17/2019  . Encounter for screening for depression 04/17/2019  . Headache, unspecified 04/17/2019  . 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 Pain, unspecified 04/17/2019  . Polyp of colon 04/17/2019  . Primary generalized (osteo)arthritis 04/17/2019  . Pruritus, unspecified  04/17/2019  . Pure hypercholesterolemia, unspecified 04/17/2019  . Secondary hyperparathyroidism of renal origin ([place]) 04/17/2019  . Gastro-esophageal reflux disease without esophagitis 04/17/2019  . Essential (primary) hypertension 04/17/2019  . Hyperlipidemia, unspecified 04/17/2019  . Transient cerebral ischemic attack, unspecified 04/17/2019  . Hypoxemia 12/13/2018  . Atrial fibrillation ([place]) 03/23/2017  . Prolonged Q-T interval on ECG 02/24/2017  . Hypokalemia 02/24/2017  . Acute ischemic stroke ([place]) 02/23/2017  . Malnutrition of moderate degree 12/24/2016  . Problem with dialysis access ([place]) 12/21/2016  . Acute ischemic colitis ([place]) 05/16/2016  . Colitis 05/15/2016  . Rectal bleeding 05/15/2016  . Neurologic abnormality 11/19/2015  . Hyperkalemia 11/19/2015  . Anxiety 11/19/2015  . Insomnia 11/19/2015  . Gait instability   . Stroke-like symptom 11/16/2015  . Vestibular neuritis 11/16/2015  . Stroke (cerebrum) ([place]) 11/16/2015  . Dizziness 05/09/2015  . Ataxia 05/09/2015  . H/O: CVA (cerebrovascular accident) 05/09/2015  . Left facial numbness 05/09/2015  . Left leg numbness 05/09/2015  . Hyperlipidemia   . Acute on chronic diastolic heart failure ([place])   . Shortness of breath 04/01/2013  . HTN (hypertension) 04/01/2013  . Cholecystitis, acute 11/07/2012  . End-stage renal disease on hemodialysis ([place]) 11/07/2012  . Anemia in chronic kidney disease 11/07/2012    Past Surgical History:  Procedure Laterality Date  . AV FISTULA PLACEMENT     for dialysis  . AV FISTULA PLACEMENT Left 11/22/2015   Procedure: ARTERIOVENOUS (AV) FISTULA CREATION-LEFT BRACHIOCEPHALIC;  Surgeon: Serafina Mitchell, MD;  Location: [facility];  Service: Vascular;  Laterality: Left;  . AV FISTULA PLACEMENT Right 03/15/2020   Procedure: INSERTION OF ARTERIOVENOUS (AV) GORE-TEX GRAFT ARM ( BRACHIAL AXILLARY );  Surgeon: Katha Cabal, MD;  Location: [facility];  Service: Vascular;  Laterality: Right;   . BACK SURGERY    . CERVICAL FUSION    . CHOLECYSTECTOMY  12/02/2012   Procedure: LAPAROSCOPIC CHOLECYSTECTOMY WITH INTRAOPERATIVE CHOLANGIOGRAM;  Surgeon: Edward Jolly, MD;  Location: [facility];  Service: General;  Laterality: N/A;  . EYE SURGERY Bilateral    cataract surgery  . EYE SURGERY Left 2019   laser  . HEMATOMA EVACUATION Left 12/24/2016   Procedure: EVACUATION HEMATOMA LEFT UPPER ARM;  Surgeon: Waynetta Sandy, MD;  Location: [place];  Service: Vascular;  Laterality: Left;  . I & D EXTREMITY Left 12/31/2016   Procedure: IRRIGATION AND DEBRIDEMENT EXTREMITY;  Surgeon: Angelia Mould, MD;  Location: [facility];  Service: Vascular;  Laterality: Left;  . INSERTION OF DIALYSIS CATHETER Right 12/24/2016   Procedure: INSERTION OF DIALYSIS CATHETER;  Surgeon: Waynetta Sandy, MD;  Location: [facility];  Service: Vascular;  Laterality: Right;  . INSERTION OF DIALYSIS CATHETER Right 02/04/2017   Procedure: INSERTION OF DIALYSIS CATHETER;  Surgeon: Waynetta Sandy, MD;  Location: [facility];  Service: Vascular;  Laterality: Right;  . KIDNEY TRANSPLANT  1996  . PERIPHERAL VASCULAR CATHETERIZATION Left 10/23/2016   Procedure: Fistulagram;  Surgeon: Elam Dutch, MD;  Location: [facility] CV LAB;  Service: Cardiovascular;  Laterality: Left;  .  PERIPHERAL VASCULAR THROMBECTOMY Right 04/16/2020   Procedure: PERIPHERAL VASCULAR THROMBECTOMY;  Surgeon: Katha Cabal, MD;  Location: [facility] CV LAB;  Service: Cardiovascular;  Laterality: Right;  . RESECTION OF ARTERIOVENOUS FISTULA ANEURYSM Left 11/22/2015   Procedure: RESECTION OF LEFT RADIOCEPHALIC FISTULA ANEURYSM ;  Surgeon: Serafina Mitchell, MD;  Location: [facility];  Service: Vascular;  Laterality: Left;  . REVISON OF ARTERIOVENOUS FISTULA Left 12/22/2016   Procedure: REVISON OF LEFT ARTERIOVENOUS FISTULA;  Surgeon: Waynetta Sandy, MD;  Location: [facility];  Service: Vascular;  Laterality: Left;  . REVISON OF  ARTERIOVENOUS FISTULA Left 02/04/2017   Procedure: REVISON OF LEFT UPPER ARM ARTERIOVENOUS FISTULA;  Surgeon: Waynetta Sandy, MD;  Location: [place];  Service: Vascular;  Laterality: Left;  . UPPER EXTREMITY ANGIOGRAPHY Bilateral 09/19/2019   Procedure: UPPER EXTREMITY ANGIOGRAPHY;  Surgeon: Katha Cabal, MD;  Location: [facility] CV LAB;  Service: Cardiovascular;  Laterality: Bilateral;     OB History   No obstetric history on file.     Family History  Problem Relation Age of Onset  . Colon cancer Brother   . Cancer Brother   . Coronary artery disease Mother 5  . Hyperlipidemia Mother   . Hypertension Mother   . Stroke Maternal Aunt   . Esophageal cancer Neg Hx   . Stomach cancer Neg Hx   . Rectal cancer Neg Hx     Social History   Tobacco Use  . Smoking status: Former    Types: Cigarettes    Quit date: 12/31/1991    Years since quitting: 29.8  . Smokeless tobacco: Never  Vaping Use  . Vaping Use: Never used  Substance Use Topics  . Alcohol use: No    Alcohol/week: 0.0 standard drinks  . Drug use: No    Home Medications Prior to Admission medications   Medication Sig Start Date End Date Taking? Authorizing Provider  acetaminophen (TYLENOL) 500 MG tablet Take 1 tablet (500 mg total) by mouth every 6 (six) hours as needed. Patient taking differently: Take 500 mg by mouth every 6 (six) hours as needed for mild pain. 09/05/18  Yes Law, Bea Graff, PA-C  albuterol (VENTOLIN HFA) 108 (90 Base) MCG/ACT inhaler Inhale 1-2 puffs into the lungs every 6 (six) hours as needed for wheezing or shortness of breath. 10/26/21  Yes Jeanell Sparrow, DO  ALPRAZolam Duanne Moron) 0.25 MG tablet Take 1 tablet (0.25 mg total) by mouth daily as needed for anxiety. 08/18/21  Yes Nita Sells, MD  amoxicillin-clavulanate (AUGMENTIN) 500-125 MG tablet Take 1 tablet (500 mg total) by mouth 2 (two) times daily. 10/17/21  Yes Domenic Moras, PA-C  atorvastatin (LIPITOR) 80 MG tablet TAKE  1 TABLET (80 MG TOTAL) BY MOUTH DAILY. 02/25/21 02/25/22 Yes Dessa Phi, DO  busPIRone (BUSPAR) 10 MG tablet TAKE 1 TABLET (10 MG TOTAL) BY MOUTH TWO TIMES DAILY. Patient taking differently: Take 10 mg by mouth daily as needed (anxiety). 08/18/21 08/18/22 Yes Nita Sells, MD  carbamide peroxide (DEBROX) 6.5 % OTIC solution Place 5 drops into the left ear 2 (two) times daily as needed. 10/26/21  Yes Jeanell Sparrow, DO  cinacalcet (SENSIPAR) 30 MG tablet Take 30 mg by mouth daily as needed. 01/04/20  Yes [provider]  folic acid-vitamin b complex-vitamin c-selenium-zinc (DIALYVITE) 3 MG TABS tablet Take 1 tablet by mouth daily.   Yes [provider]  lactulose (CHRONULAC) 10 GM/15ML solution Take 20 g by mouth daily as needed for mild  constipation.   Yes [provider]  ondansetron (ZOFRAN) 8 MG tablet Take 8 mg by mouth 2 (two) times daily as needed for nausea or vomiting.    Yes [provider]  pantoprazole (PROTONIX) 40 MG tablet Take 1 tablet (40 mg total) by mouth daily. Patient taking differently: Take 40 mg by mouth daily as needed (indigestion). 08/18/21  Yes Nita Sells, MD  zolpidem (AMBIEN) 10 MG tablet Take 10 mg by mouth at bedtime as needed for sleep. 02/13/21  Yes [provider]  albuterol (VENTOLIN HFA) 108 (90 Base) MCG/ACT inhaler Inhale 2 puffs into the lungs every 6 (six) hours as needed for wheezing or shortness of breath.    [provider]    Allergies    Sulfa antibiotics and Adhesive [tape]  Review of Systems   Review of Systems  Constitutional:  Negative for chills and fever.  HENT:  Negative for facial swelling and trouble swallowing.   Eyes:  Negative for photophobia and visual disturbance.  Respiratory:  Positive for shortness of breath. Negative for cough.   Cardiovascular:  Negative for chest pain and palpitations.  Gastrointestinal:  Negative for abdominal pain, nausea and vomiting.   Endocrine: Negative for polydipsia and polyuria.  Genitourinary:  Negative for difficulty urinating and hematuria.  Musculoskeletal:  Negative for gait problem and joint swelling.  Skin:  Positive for wound. Negative for pallor and rash.  Neurological:  Negative for syncope and headaches.  Psychiatric/Behavioral:  Negative for agitation and confusion.    Physical Exam Updated Vital Signs BP (!) 143/59   Pulse 80   Temp 97.7 F (36.5 C) (Oral)   Resp (!) 24   SpO2 100%   Physical Exam Vitals and nursing note reviewed.  Constitutional:      General: She is not in acute distress.    Appearance: Normal appearance.  HENT:     Head: Normocephalic and atraumatic.     Right Ear: External ear normal.     Left Ear: External ear normal.     Ears:     Comments: Some cerumen, does not appear to be impacted, there is extensive scarring to the external canal    Nose: Nose normal.     Mouth/Throat:     Mouth: Mucous membranes are moist.  Eyes:     General: No scleral icterus.       Right eye: No discharge.        Left eye: No discharge.  Cardiovascular:     Rate and Rhythm: Normal rate and regular rhythm.     Pulses: Normal pulses.          Radial pulses are 2+ on the right side and 2+ on the left side.       Dorsalis pedis pulses are 2+ on the right side and 2+ on the left side.     Heart sounds: Normal heart sounds.     Comments: Lue fistula with +bruit  Pulmonary:     Effort: Pulmonary effort is normal. No respiratory distress.     Breath sounds: Normal breath sounds. No decreased breath sounds or wheezing.  Abdominal:     General: Abdomen is flat.     Tenderness: There is no abdominal tenderness.  Musculoskeletal:        General: Normal range of motion.     Cervical back: Normal range of motion.     Right lower leg: No edema.     Left lower leg: No edema.  Feet:  Skin:    General: Skin is warm and dry.     Capillary Refill: Capillary refill takes less than 2  seconds.  Neurological:     Mental Status: She is alert.  Psychiatric:        Mood and Affect: Mood normal.        Behavior: Behavior normal.    ED Results / Procedures / Treatments   Labs (all labs ordered are listed, but only abnormal results are displayed) Labs Reviewed  CBC - Abnormal; Notable for the following components:      Result Value   RBC 3.15 (*)    Hemoglobin 9.7 (*)    HCT 31.1 (*)    RDW 18.7 (*)    All other components within normal limits  COMPREHENSIVE METABOLIC PANEL - Abnormal; Notable for the following components:   Chloride 96 (*)    BUN 32 (*)    Creatinine, Ser 5.41 (*)    Calcium 7.4 (*)    Albumin 2.8 (*)    GFR, Estimated 8 (*)    All other components within normal limits  LIPASE, BLOOD    EKG EKG Interpretation  Date/Time:  Sunday October 26 2021 05:09:44 EST Ventricular Rate:  88 PR Interval:  188 QRS Duration: 88 QT Interval:  422 QTC Calculation: 510 R Axis:   88 Text Interpretation: Normal sinus rhythm Prolonged QT Abnormal ECG When compared with ECG of 10/13/2021, No significant change was found Confirmed by Delora Fuel ([id number]) on 10/26/2021 5:51:15 AM  Radiology DG Chest 2 View  Result Date: 10/26/2021 CLINICAL DATA:  70 year old female with history of shortness of breath since yesterday evening. EXAM: CHEST - 2 VIEW COMPARISON:  Chest x-ray 10/13/2021. FINDINGS: Lung volumes are normal. No consolidative airspace disease. Trace bilateral pleural effusions. No pneumothorax. There is cephalization of the pulmonary vasculature and slight indistinctness of the interstitial markings suggestive of mild pulmonary edema. Patchy areas of mild scarring are noted in the periphery of the upper lobes of the lungs bilaterally, similar to the prior study. Moderate cardiomegaly. The patient is rotated to the right on today's exam, resulting in distortion of the mediastinal contours and reduced diagnostic sensitivity and specificity for mediastinal  pathology. Atherosclerotic calcifications are noted in the thoracic aorta. Old healed fracture of the lateral aspect of the left seventh rib incidentally noted. Orthopedic fixation hardware in the cervical spine incidentally noted. IMPRESSION: 1. The appearance the chest suggests congestive heart failure, as above. 2. Aortic atherosclerosis. Electronically Signed   By: Vinnie Langton M.D.   On: 10/26/2021 06:01    Procedures Procedures   Medications Ordered in ED Medications  albuterol (VENTOLIN HFA) 108 (90 Base) MCG/ACT inhaler 1 puff (1 puff Inhalation Given 10/26/21 1230)    ED Course  I have reviewed the triage vital signs and the nursing notes.  Pertinent labs & imaging results that were available during my care of the patient were reviewed by me and considered in my medical decision making (see chart for details).    MDM Rules/Calculators/A&P                           CC: mx complaints   This patient complains of above; this involves an extensive number of treatment options and is a complaint that carries with it a high risk of complications and morbidity. Vital signs were reviewed. Serious etiologies considered.  Record review:  Previous records obtained and reviewed   Work  up as above, notable for:  Labs & imaging results that were available during my care of the patient were reviewed by me and considered in my medical decision making.   I ordered imaging studies which included CXR and I independently visualized and interpreted imaging which showed chronic changes, hf  Management: Pt given albuterol inhaler, peroxide for the left ear to remove cerumen  Reassessment:  Pt reports that she is feeling much better after being evaluated, she remains overall asymptomatic, she is not hypoxic. She has no conversational dyspnea. Advised her to not stick objects into her ear. Labs appear stable. She does not appear to be in overt CHF, she last got HD Friday. Advised her to keep  her HD appointment tomorrow and f/u with PCP.    The patient improved significantly and was discharged in stable condition. Detailed discussions were had with the patient regarding current findings, and need for close f/u with PCP or on call doctor. The patient has been instructed to return immediately if the symptoms worsen in any way for re-evaluation. Patient verbalized understanding and is in agreement with current care plan. All questions answered prior to discharge.           This chart was dictated using voice recognition software.  Despite best efforts to proofread,  errors can occur which can change the documentation meaning.  Final Clinical Impression(s) / ED Diagnoses Final diagnoses:  Dialysis patient ([place])  Excessive cerumen in ear canal, left    Rx / DC Orders ED Discharge Orders          Ordered    carbamide peroxide (DEBROX) 6.5 % OTIC solution  2 times daily PRN        10/26/21 1252    albuterol (VENTOLIN HFA) 108 (90 Base) MCG/ACT inhaler  Every 6 hours PRN        10/26/21 1252             Jeanell Sparrow, DO 10/26/21 1503

## 2021-10-26 NOTE — ED Notes (Signed)
Peroxide placed in ear at this time

## 2021-10-26 NOTE — Discharge Instructions (Addendum)
Please follow up with dialysis clinic on Monday for you HD session.

## 2021-10-27 ENCOUNTER — Observation Stay (HOSPITAL_COMMUNITY)
Admission: EM | Admit: 2021-10-27 | Discharge: 2021-10-28 | Disposition: A | Discharge status: Home / Self Care | Payer: Medicare HMO | Attending: Family Medicine | Admitting: Family Medicine

## 2021-10-27 ENCOUNTER — Emergency Department (HOSPITAL_COMMUNITY): Payer: Medicare HMO

## 2021-10-27 DIAGNOSIS — I517 Cardiomegaly: Secondary | ICD-10-CM | POA: Diagnosis not present

## 2021-10-27 DIAGNOSIS — Z79899 Other long term (current) drug therapy: Secondary | ICD-10-CM | POA: Insufficient documentation

## 2021-10-27 DIAGNOSIS — E785 Hyperlipidemia, unspecified: Secondary | ICD-10-CM | POA: Diagnosis not present

## 2021-10-27 DIAGNOSIS — I480 Paroxysmal atrial fibrillation: Secondary | ICD-10-CM | POA: Diagnosis not present

## 2021-10-27 DIAGNOSIS — I132 Hypertensive heart and chronic kidney disease with heart failure and with stage 5 chronic kidney disease, or end stage renal disease: Secondary | ICD-10-CM | POA: Insufficient documentation

## 2021-10-27 DIAGNOSIS — K219 Gastro-esophageal reflux disease without esophagitis: Secondary | ICD-10-CM | POA: Diagnosis not present

## 2021-10-27 DIAGNOSIS — I10 Essential (primary) hypertension: Secondary | ICD-10-CM | POA: Diagnosis present

## 2021-10-27 DIAGNOSIS — R0602 Shortness of breath: Principal | ICD-10-CM | POA: Insufficient documentation

## 2021-10-27 DIAGNOSIS — Z8673 Personal history of transient ischemic attack (TIA), and cerebral infarction without residual deficits: Secondary | ICD-10-CM

## 2021-10-27 DIAGNOSIS — Z87891 Personal history of nicotine dependence: Secondary | ICD-10-CM | POA: Diagnosis not present

## 2021-10-27 DIAGNOSIS — Z20822 Contact with and (suspected) exposure to covid-19: Secondary | ICD-10-CM | POA: Insufficient documentation

## 2021-10-27 DIAGNOSIS — N186 End stage renal disease: Secondary | ICD-10-CM | POA: Insufficient documentation

## 2021-10-27 DIAGNOSIS — Z992 Dependence on renal dialysis: Secondary | ICD-10-CM | POA: Insufficient documentation

## 2021-10-27 DIAGNOSIS — R0600 Dyspnea, unspecified: Secondary | ICD-10-CM | POA: Diagnosis not present

## 2021-10-27 DIAGNOSIS — R0902 Hypoxemia: Secondary | ICD-10-CM | POA: Diagnosis not present

## 2021-10-27 DIAGNOSIS — J90 Pleural effusion, not elsewhere classified: Secondary | ICD-10-CM | POA: Diagnosis not present

## 2021-10-27 DIAGNOSIS — K5720 Diverticulitis of large intestine with perforation and abscess without bleeding: Secondary | ICD-10-CM | POA: Diagnosis not present

## 2021-10-27 DIAGNOSIS — I5032 Chronic diastolic (congestive) heart failure: Secondary | ICD-10-CM | POA: Diagnosis not present

## 2021-10-27 DIAGNOSIS — N189 Chronic kidney disease, unspecified: Secondary | ICD-10-CM | POA: Diagnosis present

## 2021-10-27 DIAGNOSIS — I4891 Unspecified atrial fibrillation: Secondary | ICD-10-CM | POA: Diagnosis present

## 2021-10-27 DIAGNOSIS — R9431 Abnormal electrocardiogram [ECG] [EKG]: Secondary | ICD-10-CM | POA: Diagnosis present

## 2021-10-27 DIAGNOSIS — D631 Anemia in chronic kidney disease: Secondary | ICD-10-CM | POA: Diagnosis present

## 2021-10-27 DIAGNOSIS — F419 Anxiety disorder, unspecified: Secondary | ICD-10-CM | POA: Diagnosis not present

## 2021-10-27 DIAGNOSIS — I5030 Unspecified diastolic (congestive) heart failure: Secondary | ICD-10-CM | POA: Insufficient documentation

## 2021-10-27 DIAGNOSIS — R0601 Orthopnea: Secondary | ICD-10-CM | POA: Diagnosis not present

## 2021-10-27 DIAGNOSIS — I2510 Atherosclerotic heart disease of native coronary artery without angina pectoris: Secondary | ICD-10-CM | POA: Insufficient documentation

## 2021-10-27 DIAGNOSIS — R069 Unspecified abnormalities of breathing: Secondary | ICD-10-CM | POA: Diagnosis not present

## 2021-10-27 DIAGNOSIS — I722 Aneurysm of renal artery: Secondary | ICD-10-CM | POA: Diagnosis not present

## 2021-10-27 LAB — BASIC METABOLIC PANEL
Anion gap: 14 (ref 5–15)
BUN: 45 mg/dL — ABNORMAL HIGH (ref 8–23)
CO2: 28 mmol/L (ref 22–32)
Calcium: 7.9 mg/dL — ABNORMAL LOW (ref 8.9–10.3)
Chloride: 95 mmol/L — ABNORMAL LOW (ref 98–111)
Creatinine, Ser: 6.86 mg/dL — ABNORMAL HIGH (ref 0.44–1.00)
GFR, Estimated: 6 mL/min — ABNORMAL LOW (ref >60)
Glucose, Bld: 100 mg/dL — ABNORMAL HIGH (ref 70–99)
Potassium: 4 mmol/L (ref 3.5–5.1)
Sodium: 137 mmol/L (ref 135–145)

## 2021-10-27 LAB — RESPIRATORY PANEL BY PCR

## 2021-10-27 LAB — CBC
HCT: 31.5 % — ABNORMAL LOW (ref 36.0–46.0)
Hemoglobin: 10.1 g/dL — ABNORMAL LOW (ref 12.0–15.0)
MCH: 31 pg (ref 26.0–34.0)
MCHC: 32.1 g/dL (ref 30.0–36.0)
MCV: 96.6 fL (ref 80.0–100.0)
Platelets: 216 10*3/uL (ref 150–400)
RBC: 3.26 MIL/uL — ABNORMAL LOW (ref 3.87–5.11)
RDW: 18.3 % — ABNORMAL HIGH (ref 11.5–15.5)
WBC: 5.9 10*3/uL (ref 4.0–10.5)
nRBC: 0 % (ref 0.0–0.2)

## 2021-10-27 LAB — MAGNESIUM: Magnesium: 2.2 mg/dL (ref 1.7–2.4)

## 2021-10-27 LAB — RESP PANEL BY RT-PCR (FLU A&B, COVID) ARPGX2
Influenza A by PCR: NEGATIVE
Influenza B by PCR: NEGATIVE
SARS Coronavirus 2 by RT PCR: NEGATIVE

## 2021-10-27 LAB — BRAIN NATRIURETIC PEPTIDE: B Natriuretic Peptide: 3837.3 pg/mL — ABNORMAL HIGH (ref 0.0–100.0)

## 2021-10-27 MED ORDER — ATORVASTATIN CALCIUM 80 MG PO TABS
80.0000 mg | ORAL_TABLET | Freq: Every day | ORAL | Status: DC
  Administered 2021-10-28: 80 mg via ORAL
  Filled 2021-10-27: qty 1

## 2021-10-27 MED ORDER — LIDOCAINE HCL (PF) 1 % IJ SOLN: 5.0000 mL | INTRAMUSCULAR | Status: DC | PRN

## 2021-10-27 MED ORDER — SODIUM CHLORIDE 0.9 % IV SOLN: 100.0000 mL | INTRAVENOUS | Status: DC | PRN

## 2021-10-27 MED ORDER — CHLORHEXIDINE GLUCONATE CLOTH 2 % EX PADS
6.0000 | MEDICATED_PAD | Freq: Every day | CUTANEOUS | Status: DC
  Administered 2021-10-28: 6 via TOPICAL

## 2021-10-27 MED ORDER — LIDOCAINE-PRILOCAINE 2.5-2.5 % EX CREA
1.0000 "application " | TOPICAL_CREAM | CUTANEOUS | Status: DC | PRN
  Filled 2021-10-27: qty 5

## 2021-10-27 MED ORDER — HYDRALAZINE HCL 20 MG/ML IJ SOLN: 5.0000 mg | Freq: Four times a day (QID) | INTRAMUSCULAR | Status: DC | PRN

## 2021-10-27 MED ORDER — ALTEPLASE 2 MG IJ SOLR: 2.0000 mg | Freq: Once | INTRAMUSCULAR | Status: DC | PRN

## 2021-10-27 MED ORDER — ZOLPIDEM TARTRATE 5 MG PO TABS
5.0000 mg | ORAL_TABLET | Freq: Every evening | ORAL | Status: DC | PRN
  Administered 2021-10-27: 5 mg via ORAL
  Filled 2021-10-27: qty 1

## 2021-10-27 MED ORDER — SODIUM CHLORIDE 0.9% FLUSH: 3.0000 mL | INTRAVENOUS | Status: DC | PRN

## 2021-10-27 MED ORDER — SODIUM CHLORIDE 0.9 % IV SOLN: 250.0000 mL | INTRAVENOUS | Status: DC | PRN

## 2021-10-27 MED ORDER — SODIUM CHLORIDE 0.9% FLUSH
3.0000 mL | Freq: Two times a day (BID) | INTRAVENOUS | Status: DC
  Administered 2021-10-27 – 2021-10-28 (×3): 3 mL via INTRAVENOUS

## 2021-10-27 MED ORDER — SUCRALFATE 1 G PO TABS
1.0000 g | ORAL_TABLET | Freq: Every day | ORAL | Status: DC
  Administered 2021-10-28: 1 g via ORAL
  Filled 2021-10-27: qty 1

## 2021-10-27 MED ORDER — HEPARIN SODIUM (PORCINE) 5000 UNIT/ML IJ SOLN
5000.0000 [IU] | Freq: Three times a day (TID) | INTRAMUSCULAR | Status: DC
  Administered 2021-10-27 – 2021-10-28 (×3): 5000 [IU] via SUBCUTANEOUS
  Filled 2021-10-27 (×3): qty 1

## 2021-10-27 MED ORDER — CINACALCET HCL 30 MG PO TABS: 30.0000 mg | ORAL_TABLET | Freq: Every day | ORAL | Status: DC

## 2021-10-27 MED ORDER — BUSPIRONE HCL 5 MG PO TABS: 10.0000 mg | ORAL_TABLET | Freq: Two times a day (BID) | ORAL | Status: DC | PRN

## 2021-10-27 MED ORDER — ALPRAZOLAM 0.25 MG PO TABS: 0.2500 mg | ORAL_TABLET | Freq: Every day | ORAL | Status: DC | PRN

## 2021-10-27 MED ORDER — PANTOPRAZOLE SODIUM 40 MG PO TBEC
40.0000 mg | DELAYED_RELEASE_TABLET | Freq: Every day | ORAL | Status: DC
  Administered 2021-10-28: 40 mg via ORAL
  Filled 2021-10-27: qty 1

## 2021-10-27 MED ORDER — HEPARIN SODIUM (PORCINE) 1000 UNIT/ML DIALYSIS
1000.0000 [IU] | INTRAMUSCULAR | Status: DC | PRN
  Filled 2021-10-27: qty 1

## 2021-10-27 MED ORDER — ACETAMINOPHEN 650 MG RE SUPP: 650.0000 mg | Freq: Four times a day (QID) | RECTAL | Status: DC | PRN

## 2021-10-27 MED ORDER — ACETAMINOPHEN 325 MG PO TABS: 650.0000 mg | ORAL_TABLET | Freq: Four times a day (QID) | ORAL | Status: DC | PRN

## 2021-10-27 MED ORDER — GUAIFENESIN-DM 100-10 MG/5ML PO SYRP: 5.0000 mL | ORAL_SOLUTION | ORAL | Status: DC | PRN

## 2021-10-27 MED ORDER — PENTAFLUOROPROP-TETRAFLUOROETH EX AERO
1.0000 "application " | INHALATION_SPRAY | CUTANEOUS | Status: DC | PRN
  Filled 2021-10-27: qty 116

## 2021-10-27 MED ORDER — AMOXICILLIN-POT CLAVULANATE 500-125 MG PO TABS
1.0000 | ORAL_TABLET | Freq: Two times a day (BID) | ORAL | Status: DC
  Administered 2021-10-27 – 2021-10-28 (×2): 500 mg via ORAL
  Filled 2021-10-27 (×3): qty 1

## 2021-10-27 NOTE — H&P (Signed)
History and Physical    [name] FXO:[id number] DOB: 1951/05/22 DOA: 10/27/2021  PCP: [name], MD Consultants:  nephrology: Dr. Joelyn Oms, cardiology: [name]  Patient coming from:  Home - lives at home with her mother (she is 73)   Chief Complaint: worsening shortness of breath   HPI: [name] is a 70 y.o. 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  She states it started yesterday evening and felt like she couldn't get a deep breath in and needed more air. She came to ED yesterday for multiple complaints including shortness of breath and was sent home. She went back home and felt like she continued to be short of breath so came back to ED. She did not go to dialysis today. Her last full session of dialysis was Friday. She denies any fever/chills, but has had a dry cough.   She tells me she just wants to be in a safe place and feels safer when she is here being looked after. She does feel safe at home.   She denies any headaches, vision changes, chest pain, palpitations, stomach pain, N/V/D, leg swelling or weight gain. No orthopnea.  She states she makes no urine.   Has had chronic shortness of breath which she saw pulmonology in 02/2021 and appears she has not followed up and PFTs were not done. Does not appear changed much from this.    ED Course: vitals: Febrile, blood pressure 130/90, heart rate 88, respiratory rate 19, oxygen 98% on room air.  Pertinent labs: Hemoglobin 10.1, BUN 45, creatinine 6.86, Chest x-ray compared to yesterday shows slightly increased size of small bilateral pleural effusions and unchanged mild pulmonary interstitial edema. In ED nephrology was consulted and [facility] was asked to admit.  Review of Systems: As per HPI; otherwise review of  systems reviewed and negative.   Ambulatory Status:  Ambulates with walker     Past Medical History:  Diagnosis Date   Adenomatous polyp of colon 10/2010, 2006, 2015   Anemia in CKD (chronic kidney disease) 11/07/2012   s/p blood transfusion.    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  Had some early rejection then stable function for years, then had slow decline of function and went back on hemodialysis in 2012.  Gets HD TTS schedule at [facility] on [facility] still using L forearm AVF.      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[place])    left upper arm   Pulmonary edema 12/2019   Stroke ([place]) 11/2015   TIA   Weight loss, unintentional     Past Surgical History:  Procedure Laterality Date  AV FISTULA PLACEMENT     for dialysis   AV FISTULA PLACEMENT Left 11/22/2015   Procedure: ARTERIOVENOUS (AV) FISTULA CREATION-LEFT BRACHIOCEPHALIC;  Surgeon: Serafina Mitchell, MD;  Location: [place];  Service: Vascular;  Laterality: Left;   AV FISTULA PLACEMENT Right 03/15/2020   Procedure: INSERTION OF ARTERIOVENOUS (AV) GORE-TEX GRAFT ARM ( BRACHIAL AXILLARY );  Surgeon: Katha Cabal, MD;  Location: [facility];  Service: Vascular;  Laterality: Right;   BACK SURGERY     CERVICAL FUSION     CHOLECYSTECTOMY  12/02/2012   Procedure: LAPAROSCOPIC CHOLECYSTECTOMY WITH INTRAOPERATIVE CHOLANGIOGRAM;  Surgeon: Edward Jolly, MD;  Location: [place];  Service:  General;  Laterality: N/A;   EYE SURGERY Bilateral    cataract surgery   EYE SURGERY Left 2019   laser   HEMATOMA EVACUATION Left 12/24/2016   Procedure: EVACUATION HEMATOMA LEFT UPPER ARM;  Surgeon: Waynetta Sandy, MD;  Location: [place];  Service: Vascular;  Laterality: Left;   I & D EXTREMITY Left 12/31/2016   Procedure: IRRIGATION AND DEBRIDEMENT EXTREMITY;  Surgeon: Angelia Mould, MD;  Location: [place];  Service: Vascular;  Laterality: Left;   INSERTION OF DIALYSIS CATHETER Right 12/24/2016   Procedure: INSERTION OF DIALYSIS CATHETER;  Surgeon: Waynetta Sandy, MD;  Location: [place];  Service: Vascular;  Laterality: Right;   INSERTION OF DIALYSIS CATHETER Right 02/04/2017   Procedure: INSERTION OF DIALYSIS CATHETER;  Surgeon: Waynetta Sandy, MD;  Location: [place];  Service: Vascular;  Laterality: Right;   [place] Left 10/23/2016   Procedure: Fistulagram;  Surgeon: Elam Dutch, MD;  Location: [place] [facility];  Service: Cardiovascular;  Laterality: Left;   PERIPHERAL VASCULAR THROMBECTOMY Right 04/16/2020   Procedure: PERIPHERAL VASCULAR THROMBECTOMY;  Surgeon: Katha Cabal, MD;  Location: [place] [facility];  Service: Cardiovascular;  Laterality: Right;   RESECTION OF ARTERIOVENOUS FISTULA ANEURYSM Left 11/22/2015   Procedure: RESECTION OF LEFT RADIOCEPHALIC FISTULA ANEURYSM ;  Surgeon: Serafina Mitchell, MD;  Location: [place] [facility];  Service: Vascular;  Laterality: Left;   REVISON OF ARTERIOVENOUS FISTULA Left 12/22/2016   Procedure: REVISON OF LEFT ARTERIOVENOUS FISTULA;  Surgeon: Waynetta Sandy, MD;  Location: [place];  Service: Vascular;  Laterality: Left;   REVISON OF ARTERIOVENOUS FISTULA Left 02/04/2017   Procedure: REVISON OF LEFT UPPER ARM ARTERIOVENOUS FISTULA;  Surgeon: Waynetta Sandy, MD;  Location: [place];  Service: Vascular;  Laterality: Left;   UPPER EXTREMITY ANGIOGRAPHY  Bilateral 09/19/2019   Procedure: UPPER EXTREMITY ANGIOGRAPHY;  Surgeon: Katha Cabal, MD;  Location: [place] [facility];  Service: Cardiovascular;  Laterality: Bilateral;    Social History   Socioeconomic History   Marital status: Widowed    Spouse name: Not on file   Number of children: 2   Years of education: Not on file   Highest education level: Not on file  Occupational History   Not on file  Tobacco Use   Smoking status: Former    Types: Cigarettes    Quit date: 12/31/1991    Years since quitting: 29.8   Smokeless tobacco: Never  Vaping Use   Vaping Use: Never used  Substance and Sexual Activity   Alcohol use: No    Alcohol/week: 0.0 standard drinks   Drug use: No   Sexual activity: Not Currently  Other Topics Concern   Not on file  Social History Narrative   Living with her mother  Right handed   Caffeine: only decaf clears ("no brown sodas" or coffee d/t kidney disease)   Low potassium foods d/t kidney disease   Social Determinants of Health   Financial Resource Strain: Not on file  Food Insecurity: Not on file  Transportation Needs: Not on file  Physical Activity: Not on file  Stress: Not on file  Social Connections: Not on file  Intimate Partner Violence: Not on file    Allergies  Allergen Reactions   Sulfa Antibiotics Other (See Comments)    Per patient, both parents allergic-so will not take   Adhesive [Tape] Itching    Family History  Problem Relation Age of Onset   Colon cancer Brother    Cancer Brother    Coronary artery disease Mother 89   Hyperlipidemia Mother    Hypertension Mother    Stroke Maternal Aunt    Esophageal cancer Neg Hx    Stomach cancer Neg Hx    Rectal cancer Neg Hx     Prior to Admission medications   Medication Sig Start Date End Date Taking? Authorizing Provider  acetaminophen (TYLENOL) 500 MG tablet Take 1 tablet (500 mg total) by mouth every 6 (six) hours as needed. Patient taking differently: Take 500  mg by mouth every 6 (six) hours as needed for mild pain. 09/05/18   Law, Bea Graff, PA-C  albuterol (VENTOLIN HFA) 108 (90 Base) MCG/ACT inhaler Inhale 2 puffs into the lungs every 6 (six) hours as needed for wheezing or shortness of breath.    [provider]  albuterol (VENTOLIN HFA) 108 (90 Base) MCG/ACT inhaler Inhale 1-2 puffs into the lungs every 6 (six) hours as needed for wheezing or shortness of breath. 10/26/21   Jeanell Sparrow, DO  ALPRAZolam Duanne Moron) 0.25 MG tablet Take 1 tablet (0.25 mg total) by mouth daily as needed for anxiety. 08/18/21   Nita Sells, MD  amoxicillin-clavulanate (AUGMENTIN) 500-125 MG tablet Take 1 tablet (500 mg total) by mouth 2 (two) times daily. 10/17/21   Domenic Moras, PA-C  atorvastatin (LIPITOR) 80 MG tablet TAKE 1 TABLET (80 MG TOTAL) BY MOUTH DAILY. 02/25/21 02/25/22  Dessa Phi, DO  busPIRone (BUSPAR) 10 MG tablet TAKE 1 TABLET (10 MG TOTAL) BY MOUTH TWO TIMES DAILY. Patient taking differently: Take 10 mg by mouth daily as needed (anxiety). 08/18/21 08/18/22  Nita Sells, MD  carbamide peroxide (DEBROX) 6.5 % OTIC solution Place 5 drops into the left ear 2 (two) times daily as needed. 10/26/21   Jeanell Sparrow, DO  cinacalcet (SENSIPAR) 30 MG tablet Take 30 mg by mouth daily as needed. 01/04/20   [provider]  folic acid-vitamin b complex-vitamin c-selenium-zinc (DIALYVITE) 3 MG TABS tablet Take 1 tablet by mouth daily.    [provider]  lactulose (CHRONULAC) 10 GM/15ML solution Take 20 g by mouth daily as needed for mild constipation.    [provider]  ondansetron (ZOFRAN) 8 MG tablet Take 8 mg by mouth 2 (two) times daily as needed for nausea or vomiting.     [provider]  pantoprazole (PROTONIX) 40 MG tablet Take 1 tablet (40 mg total) by mouth daily. Patient taking differently: Take 40 mg by mouth daily as needed (indigestion). 08/18/21   Nita Sells, MD  zolpidem (AMBIEN) 10 MG  tablet Take 10 mg by mouth at bedtime as needed for sleep. 02/13/21   [provider]    Physical Exam: Vitals:   10/27/21 0743 10/27/21 1034 10/27/21 1338 10/27/21 1347  BP: 130/90 138/69 121/81 121/81  Pulse: 88 87 84 88  Resp: 19 19 19 18   Temp: 98.2 F (36.8 C)   (!) 97.3 F (36.3 C)  TempSrc:    Oral  SpO2: 98% 94% 100% 95%     General:  Appears calm and comfortable and is in NAD Eyes:  PERRL, EOMI, normal lids, iris ENT:  grossly normal hearing, lips & tongue, mmm; missing teeth on top  Neck:  no LAD, masses or thyromegaly; no carotid bruits Cardiovascular:  RRR, no m/r/g. No LE edema.  Respiratory:   CTA bilaterally with no wheezes/rales/rhonchi.  Normal respiratory effort. Abdomen:  soft, NT, ND, NABS Back:   normal alignment, no CVAT Skin:  no rash or induration seen on limited exam. Dialysis graft on RUE.  Musculoskeletal:  grossly normal tone BUE/BLE, good ROM, left clavicle protrudes abnormally  Lower extremity:  No LE edema.  Limited foot exam with no ulcerations.  2+ distal pulses. She has a scab on top of her left foot. No erythema or drainage.  Psychiatric:  grossly normal mood and affect, speech fluent and appropriate, AOx3 Neurologic:  CN 2-12 grossly intact, moves all extremities in coordinated fashion, sensation intact   Radiological Exams on Admission: Independently reviewed - see discussion in A/P where applicable  DG Chest 2 View  Result Date: 10/27/2021 CLINICAL DATA:  Shortness of breath EXAM: CHEST - 2 VIEW COMPARISON:  Chest radiograph 10/26/2021 FINDINGS: The heart is enlarged, unchanged. The mediastinum appears widened, similar to the study from 10/13/2021. There is calcified atherosclerotic plaque throughout the thoracic aorta, unchanged. There is no focal consolidation. There are small bilateral pleural effusions, slightly increased in size. Again seen is cephalization of the pulmonary vasculature suggesting mild pulmonary interstitial  edema. There is no pneumothorax. The bones are stable, including a remote fracture of the left seventh rib. IMPRESSION: 1. Slightly increased size of small bilateral pleural effusions and unchanged mild pulmonary interstitial edema. 2. Unchanged cardiomegaly and prominent mediastinum. Electronically Signed   By: Valetta Mole M.D.   On: 10/27/2021 08:02   DG Chest 2 View  Result Date: 10/26/2021 CLINICAL DATA:  70 year old female with history of shortness of breath since yesterday evening. EXAM: CHEST - 2 VIEW COMPARISON:  Chest x-ray 10/13/2021. FINDINGS: Lung volumes are normal. No consolidative airspace disease. Trace bilateral pleural effusions. No pneumothorax. There is cephalization of the pulmonary vasculature and slight indistinctness of the interstitial markings suggestive of mild pulmonary edema. Patchy areas of mild scarring are noted in the periphery of the upper lobes of the lungs bilaterally, similar to the prior study. Moderate cardiomegaly. The patient is rotated to the right on today's exam, resulting in distortion of the mediastinal contours and reduced diagnostic sensitivity and specificity for mediastinal pathology. Atherosclerotic calcifications are noted in the thoracic aorta. Old healed fracture of the lateral aspect of the left seventh rib incidentally noted. Orthopedic fixation hardware in the cervical spine incidentally noted. IMPRESSION: 1. The appearance the chest suggests congestive heart failure, as above. 2. Aortic atherosclerosis. Electronically Signed   By: Vinnie Langton M.D.   On: 10/26/2021 06:01    EKG: Independently reviewed.  NSR with rate 89; nonspecific ST changes with no evidence of acute ischemia. Prolonged qt, similar to previous ekg    Labs on Admission: I have personally reviewed the available labs and imaging studies at the time of the admission.  Pertinent labs:  Hemoglobin 10.1,  BUN 45,  creatinine 6.86,    Assessment/Plan Active Problems:  Dyspnea -70 year old female with complaints of dyspnea and missed dialysis session.  -followed by pulmonology and has not been back for follow up as she was seen for chronic dypsnea on exertion. Appears to be more at her baseline.  -CXR with what appears to be mild pulmonary edema -will check echo, last echo in 02/2021 with hypokinesis of the apical septum, apical anterior segment, and apex. EF preserved. Volume control per dialysis.  -bnp -not requiring oxygen, no tachycardia and exam with no significant findings to suggest chf exac/pe.   -has had a chronic cough, checking respiratory panel and robitussin prn.  -no emergent indication for dialysis, but with dyspnea, interstitial edema and missed session will admit to observation for observation and dialysis and work up per above -discussed she needs to f/u with pulmonology outpatient     End-stage renal disease on hemodialysis ([place]) -no emergent indication for dialysis  -nephrology consulted and aware she is here. Likely dialysis tomorrow.   Hx recent colitis/diverticulitis Continue antibiotics, one more day.     H/O: CVA (cerebrovascular accident) -continue lipitor, ? No ASA.     HTN (hypertension) Well controlled off medication Volume per dialysis     Anemia in chronic kidney disease Stable, at baseline and >10.     Hyperlipidemia Continue lipitor     Prolonged QT interval  Avoid qt prolong drugs -check magnesium -telemetry   Insomnia Ambien prn    There is no height or weight on file to calculate BMI.   Level of care: Telemetry Medical DVT prophylaxis:  heparin  Code Status:  Full - confirmed with patient Family Communication: None present Disposition Plan:  The patient is from: home  Anticipated d/c is to: home  Placed in observation as anticipate hospital stay to be less than two midnights. Requires work up for dyspnea and dialysis.   Patient is currently: stable  Consults called: nephrology: Dr. Jonnie Finner    Admission status:  observation   Dragon dictation used in completing this note.   Orma Flaming MD [facility]   How to contact the [facility] Attending or Consulting provider [place] or covering provider during after hours [place], for this patient?  Check the care team in [facility] and look for a) attending/consulting [facility] provider listed and b) the [facility] team listed Log into [email or web address] and use [place] universal password to access. If you do not have the password, please contact the hospital operator. Locate the [facility] provider you are looking for under [facility] and page to a number that you can be directly reached. If you still have difficulty reaching the provider, please page the [facility] (Director on Call) for the Hospitalists listed on amion for assistance.   10/27/2021, 2:25 PM

## 2021-10-27 NOTE — ED Triage Notes (Signed)
EMS stated, she is here for SOB , missed dialysis on Friday and is due today but wanted to come back here today.

## 2021-10-27 NOTE — ED Notes (Addendum)
Returned to monitor. Alert, NAD, calm, interactive, resps e/u, speaking in clear complete sentences. VSS. Speaking with med rec tech on phone. Denies pain, nausea, sob, or dizziness. Spoke with dialysis to confirm placement on schedule. Not scheduled at this time.

## 2021-10-27 NOTE — Progress Notes (Signed)
Pt has current patent dialysis raft on Right arm so not IVsticks. Pt has 2 old grafts on Left arm - able to find a vein between 2 of the old grafts with ultrasound - very close to the surface but could not palpate it. Had to use a 1" # 20  & no longer due to all her grafts.

## 2021-10-27 NOTE — ED Triage Notes (Signed)
Pt. Was discharged from here yesterday.

## 2021-10-27 NOTE — Progress Notes (Signed)
[facility] BRIEF NOTE  [name] [id number] 25-Aug-1951  Patient admitted to observation due to worsening shortness of breath over the last 2 days.  Seen and examined in the ED.  Reports she has not missed dialysis but found it was harding to "get a deep breath consecutively" over the last few days.  Also reports the anxiety of not being able to take a deep breath caused her symptoms to worsened.  Oxygenating well on RA, no edema, breath sounds decreased but no rhonchi, wheezing or crackles noted.  Pertinent labs include K 4.0, Ca 7.9, and Hgb 10.1.  CXR showed Slightly increased size of small bilateral pleural effusions and unchanged mild pulmonary interstitial edema.  Outpatient records show patient did not meet dry weight at last HD and left 1.4kg over.   OP HD orders: [place] MWF 4hrs 2K 2Ca 350/600 48kg UFP 2 Sensipar 30mg  qHD Hectorol 74mch qHD Mircera 31mcg q2wks -last 10/31  Plan for dialysis today per regular schedule.  UF goal to dry weight.    Jen Mow, PA-C [facility] Pager: [phone]

## 2021-10-27 NOTE — ED Provider Notes (Signed)
[facility] EMERGENCY DEPARTMENT Provider Note   CSN: [id number] Arrival date & time: 10/27/21  1941     History Chief Complaint  Patient presents with   Shortness of Breath    [name] is a 70 y.o. female.  Shortness of46 year old female with a medical history as detailed below presents for evaluation.  Patient with longstanding history of ESRD.  Patient reports breath.  Her last dialysis was on Friday.  She presented yesterday for evaluation of same complaint.  She was reassured and advised to follow-up and get dialysis as an outpatient this morning.  However, patient worsening dyspnea this morning.  She arrived to the ED by EMS for reevaluation.  She is scheduled for dialysis on Monday, Wednesday, and Friday.  She is known to Dr. Hollie Salk with [facility].  She reports that she is completely anuric.  The history is provided by the patient and medical records.  Shortness of Breath Severity:  Moderate Onset quality:  Gradual Duration:  3 days Timing:  Constant Progression:  Worsening Chronicity:  Recurrent     Past Medical History:  Diagnosis Date   Adenomatous polyp of colon 10/2010, 2006, 2015   Anemia in CKD (chronic kidney disease) 11/07/2012   s/p blood transfusion.    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  Had some early rejection then stable function for years, then had slow decline of function and went back on hemodialysis in 2012.  Gets HD TTS schedule at [facility] on [facility] still using L forearm AVF.      GERD (gastroesophageal reflux disease)    GI bleed 2017   felt to be ischemic colitis, last colo 2015   Headache    Hyperlipidemia    Hypertension     Neurologic gait dysfunction    Neuromuscular disorder (HCC)    neuropathy hand and legs   Osteoporosis    Pneumonia    Pseudoaneurysm of surgical AV fistula ([place])    left upper arm   Pulmonary edema 12/2019   Stroke ([place]) 11/2015   TIA   Weight loss, unintentional     Patient Active Problem List   Diagnosis Date Noted   Protein-calorie malnutrition, severe 08/12/2021   Diverticulitis of colon with perforation 08/06/2021   Acute respiratory failure with hypoxia ([place]) 08/06/2021   Major depressive disorder, recurrent episode, moderate ([place]) 02/23/2021   TIA (transient ischemic attack) 02/21/2021   Prolonged QT interval 02/21/2021   NSTEMI (non-ST elevated myocardial infarction) ([place]) 05/14/2020   Volume overload 05/13/2020   Hypotension after procedure 03/16/2020   Chest pain 03/16/2020   Change in mental state 03/15/2020   Ventricular tachycardia 03/15/2020   Seizure ([place]) 03/15/2020   Unresponsive episode 03/15/2020   Pulmonary edema 74/07/1447   Complication of vascular access for dialysis 12/21/2019   Breakdown (mechanical) of surgically created arteriovenous fistula, initial encounter ([place]) 12/18/2019   Weakness 10/11/2019   Aneurysm artery, 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   Dependence on renal dialysis ([place]) 07/24/2019   Iron deficiency anemia, unspecified 06/09/2019   Age-related osteoporosis without current pathological fracture 04/17/2019   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   Coagulation defect, unspecified ([place]) 04/17/2019  Diarrhea, unspecified 04/17/2019   Encounter for screening for depression 04/17/2019   Headache, unspecified 04/17/2019   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   Pain, unspecified 04/17/2019   Polyp of colon 04/17/2019   Primary generalized (osteo)arthritis 04/17/2019   Pruritus, unspecified 04/17/2019   Pure hypercholesterolemia, unspecified 04/17/2019   Secondary hyperparathyroidism of renal origin ([place])  04/17/2019   Gastro-esophageal reflux disease without esophagitis 04/17/2019   Essential (primary) hypertension 04/17/2019   Hyperlipidemia, unspecified 04/17/2019   Transient cerebral ischemic attack, unspecified 04/17/2019   Hypoxemia 12/13/2018   Atrial fibrillation (HCC) 03/23/2017   Prolonged Q-T interval on ECG 02/24/2017   Hypokalemia 02/24/2017   Acute ischemic stroke ([place]) 02/23/2017   Malnutrition of moderate degree 12/24/2016   Problem with dialysis access ([place]) 12/21/2016   Acute ischemic colitis ([place]) 05/16/2016   Colitis 05/15/2016   Rectal bleeding 05/15/2016   Neurologic abnormality 11/19/2015   Hyperkalemia 11/19/2015   Anxiety 11/19/2015   Insomnia 11/19/2015   Gait instability    Stroke-like symptom 11/16/2015   Vestibular neuritis 11/16/2015   Stroke (cerebrum) ([place]) 11/16/2015   Dizziness 05/09/2015   Ataxia 05/09/2015   H/O: CVA (cerebrovascular accident) 05/09/2015   Left facial numbness 05/09/2015   Left leg numbness 05/09/2015   Hyperlipidemia    Acute on chronic diastolic heart failure ([place])    Shortness of breath 04/01/2013   HTN (hypertension) 04/01/2013   Cholecystitis, acute 11/07/2012   End-stage renal disease on hemodialysis ([place]) 11/07/2012   Anemia in chronic kidney disease 11/07/2012    Past Surgical History:  Procedure Laterality Date   AV FISTULA PLACEMENT     for dialysis   AV FISTULA PLACEMENT Left 11/22/2015   Procedure: ARTERIOVENOUS (AV) FISTULA CREATION-LEFT BRACHIOCEPHALIC;  Surgeon: Serafina Mitchell, MD;  Location: [facility];  Service: Vascular;  Laterality: Left;   AV FISTULA PLACEMENT Right 03/15/2020   Procedure: INSERTION OF ARTERIOVENOUS (AV) GORE-TEX GRAFT ARM ( BRACHIAL AXILLARY );  Surgeon: Katha Cabal, MD;  Location: [facility];  Service: Vascular;  Laterality: Right;   BACK SURGERY     CERVICAL FUSION     CHOLECYSTECTOMY  12/02/2012   Procedure: LAPAROSCOPIC CHOLECYSTECTOMY WITH INTRAOPERATIVE CHOLANGIOGRAM;  Surgeon:  Edward Jolly, MD;  Location: [facility];  Service: General;  Laterality: N/A;   EYE SURGERY Bilateral    cataract surgery   EYE SURGERY Left 2019   laser   HEMATOMA EVACUATION Left 12/24/2016   Procedure: EVACUATION HEMATOMA LEFT UPPER ARM;  Surgeon: Waynetta Sandy, MD;  Location: [facility];  Service: Vascular;  Laterality: Left;   I & D EXTREMITY Left 12/31/2016   Procedure: IRRIGATION AND DEBRIDEMENT EXTREMITY;  Surgeon: Angelia Mould, MD;  Location: [facility];  Service: Vascular;  Laterality: Left;   INSERTION OF DIALYSIS CATHETER Right 12/24/2016   Procedure: INSERTION OF DIALYSIS CATHETER;  Surgeon: Waynetta Sandy, MD;  Location: [employer];  Service: Vascular;  Laterality: Right;   INSERTION OF DIALYSIS CATHETER Right 02/04/2017   Procedure: INSERTION OF DIALYSIS CATHETER;  Surgeon: Waynetta Sandy, MD;  Location: [facility];  Service: Vascular;  Laterality: Right;   KIDNEY TRANSPLANT  1996   PERIPHERAL VASCULAR CATHETERIZATION Left 10/23/2016   Procedure: Fistulagram;  Surgeon: Elam Dutch, MD;  Location: [facility];  Service: Cardiovascular;  Laterality: Left;   PERIPHERAL VASCULAR THROMBECTOMY Right 04/16/2020   Procedure: PERIPHERAL VASCULAR THROMBECTOMY;  Surgeon: Katha Cabal, MD;  Location: [facility];  Service: Cardiovascular;  Laterality: Right;  RESECTION OF ARTERIOVENOUS FISTULA ANEURYSM Left 11/22/2015   Procedure: RESECTION OF LEFT RADIOCEPHALIC FISTULA ANEURYSM ;  Surgeon: Serafina Mitchell, MD;  Location: [facility];  Service: Vascular;  Laterality: Left;   REVISON OF ARTERIOVENOUS FISTULA Left 12/22/2016   Procedure: REVISON OF LEFT ARTERIOVENOUS FISTULA;  Surgeon: Waynetta Sandy, MD;  Location: [facility];  Service: Vascular;  Laterality: Left;   REVISON OF ARTERIOVENOUS FISTULA Left 02/04/2017   Procedure: REVISON OF LEFT UPPER ARM ARTERIOVENOUS FISTULA;  Surgeon: Waynetta Sandy, MD;  Location: [facility];  Service: Vascular;   Laterality: Left;   UPPER EXTREMITY ANGIOGRAPHY Bilateral 09/19/2019   Procedure: UPPER EXTREMITY ANGIOGRAPHY;  Surgeon: Katha Cabal, MD;  Location: [facility];  Service: Cardiovascular;  Laterality: Bilateral;     OB History   No obstetric history on file.     Family History  Problem Relation Age of Onset   Colon cancer Brother    Cancer Brother    Coronary artery disease Mother 24   Hyperlipidemia Mother    Hypertension Mother    Stroke Maternal Aunt    Esophageal cancer Neg Hx    Stomach cancer Neg Hx    Rectal cancer Neg Hx     Social History   Tobacco Use   Smoking status: Former    Types: Cigarettes    Quit date: 12/31/1991    Years since quitting: 29.8   Smokeless tobacco: Never  Vaping Use   Vaping Use: Never used  Substance Use Topics   Alcohol use: No    Alcohol/week: 0.0 standard drinks   Drug use: No    Home Medications Prior to Admission medications   Medication Sig Start Date End Date Taking? Authorizing Provider  acetaminophen (TYLENOL) 500 MG tablet Take 1 tablet (500 mg total) by mouth every 6 (six) hours as needed. Patient taking differently: Take 500 mg by mouth every 6 (six) hours as needed for mild pain. 09/05/18   Law, Bea Graff, PA-C  albuterol (VENTOLIN HFA) 108 (90 Base) MCG/ACT inhaler Inhale 2 puffs into the lungs every 6 (six) hours as needed for wheezing or shortness of breath.    [provider]  albuterol (VENTOLIN HFA) 108 (90 Base) MCG/ACT inhaler Inhale 1-2 puffs into the lungs every 6 (six) hours as needed for wheezing or shortness of breath. 10/26/21   Jeanell Sparrow, DO  ALPRAZolam Duanne Moron) 0.25 MG tablet Take 1 tablet (0.25 mg total) by mouth daily as needed for anxiety. 08/18/21   Nita Sells, MD  amoxicillin-clavulanate (AUGMENTIN) 500-125 MG tablet Take 1 tablet (500 mg total) by mouth 2 (two) times daily. 10/17/21   Domenic Moras, PA-C  atorvastatin (LIPITOR) 80 MG tablet TAKE 1 TABLET (80 MG TOTAL)  BY MOUTH DAILY. 02/25/21 02/25/22  Dessa Phi, DO  busPIRone (BUSPAR) 10 MG tablet TAKE 1 TABLET (10 MG TOTAL) BY MOUTH TWO TIMES DAILY. Patient taking differently: Take 10 mg by mouth daily as needed (anxiety). 08/18/21 08/18/22  Nita Sells, MD  carbamide peroxide (DEBROX) 6.5 % OTIC solution Place 5 drops into the left ear 2 (two) times daily as needed. 10/26/21   Jeanell Sparrow, DO  cinacalcet (SENSIPAR) 30 MG tablet Take 30 mg by mouth daily as needed. 01/04/20   [provider]  folic acid-vitamin b complex-vitamin c-selenium-zinc (DIALYVITE) 3 MG TABS tablet Take 1 tablet by mouth daily.    [provider]  lactulose (CHRONULAC) 10 GM/15ML solution Take 20 g by mouth daily as needed  for mild constipation.    [provider]  ondansetron (ZOFRAN) 8 MG tablet Take 8 mg by mouth 2 (two) times daily as needed for nausea or vomiting.     [provider]  pantoprazole (PROTONIX) 40 MG tablet Take 1 tablet (40 mg total) by mouth daily. Patient taking differently: Take 40 mg by mouth daily as needed (indigestion). 08/18/21   Nita Sells, MD  zolpidem (AMBIEN) 10 MG tablet Take 10 mg by mouth at bedtime as needed for sleep. 02/13/21   [provider]    Allergies    Sulfa antibiotics and Adhesive [tape]  Review of Systems   Review of Systems  Respiratory:  Positive for shortness of breath.   All other systems reviewed and are negative.  Physical Exam Updated Vital Signs BP 138/69 (BP Location: Left Leg)   Pulse 87   Temp 98.2 F (36.8 C)   Resp 19   SpO2 94%   Physical Exam Vitals and nursing note reviewed.  Constitutional:      General: She is not in acute distress.    Appearance: Normal appearance. She is well-developed.  HENT:     Head: Normocephalic and atraumatic.  Eyes:     Conjunctiva/sclera: Conjunctivae normal.     Pupils: Pupils are equal, round, and reactive to light.  Cardiovascular:     Rate and Rhythm: Normal  rate and regular rhythm.     Heart sounds: Normal heart sounds.  Pulmonary:     Effort: Pulmonary effort is normal. No respiratory distress.     Breath sounds: Examination of the right-lower field reveals decreased breath sounds. Examination of the left-lower field reveals decreased breath sounds. Decreased breath sounds present.  Abdominal:     General: There is no distension.     Palpations: Abdomen is soft.     Tenderness: There is no abdominal tenderness.  Musculoskeletal:        General: No deformity. Normal range of motion.     Cervical back: Normal range of motion and neck supple.     Comments: Dialysis access in right upper extremity.  No surrounding erythema or sign of infection.  Skin:    General: Skin is warm and dry.  Neurological:     General: No focal deficit present.     Mental Status: She is alert and oriented to person, place, and time.    ED Results / Procedures / Treatments   Labs (all labs ordered are listed, but only abnormal results are displayed) Labs Reviewed  BASIC METABOLIC PANEL - Abnormal; Notable for the following components:      Result Value   Chloride 95 (*)    Glucose, Bld 100 (*)    BUN 45 (*)    Creatinine, Ser 6.86 (*)    Calcium 7.9 (*)    GFR, Estimated 6 (*)    All other components within normal limits  CBC - Abnormal; Notable for the following components:   RBC 3.26 (*)    Hemoglobin 10.1 (*)    HCT 31.5 (*)    RDW 18.3 (*)    All other components within normal limits    EKG EKG Interpretation  Date/Time:  Monday October 27 2021 07:25:01 EST Ventricular Rate:  89 PR Interval:  194 QRS Duration: 84 QT Interval:  430 QTC Calculation: 523 R Axis:   94 Text Interpretation: Normal sinus rhythm Rightward axis Cannot rule out Anterior infarct , age undetermined Prolonged QT Abnormal ECG Confirmed by Dene Gentry [phone])  on 10/27/2021 1:18:55 PM  Radiology DG Chest 2 View  Result Date: 10/27/2021 CLINICAL DATA:  Shortness of  breath EXAM: CHEST - 2 VIEW COMPARISON:  Chest radiograph 10/26/2021 FINDINGS: The heart is enlarged, unchanged. The mediastinum appears widened, similar to the study from 10/13/2021. There is calcified atherosclerotic plaque throughout the thoracic aorta, unchanged. There is no focal consolidation. There are small bilateral pleural effusions, slightly increased in size. Again seen is cephalization of the pulmonary vasculature suggesting mild pulmonary interstitial edema. There is no pneumothorax. The bones are stable, including a remote fracture of the left seventh rib. IMPRESSION: 1. Slightly increased size of small bilateral pleural effusions and unchanged mild pulmonary interstitial edema. 2. Unchanged cardiomegaly and prominent mediastinum. Electronically Signed   By: Valetta Mole M.D.   On: 10/27/2021 08:02   DG Chest 2 View  Result Date: 10/26/2021 CLINICAL DATA:  70 year old female with history of shortness of breath since yesterday evening. EXAM: CHEST - 2 VIEW COMPARISON:  Chest x-ray 10/13/2021. FINDINGS: Lung volumes are normal. No consolidative airspace disease. Trace bilateral pleural effusions. No pneumothorax. There is cephalization of the pulmonary vasculature and slight indistinctness of the interstitial markings suggestive of mild pulmonary edema. Patchy areas of mild scarring are noted in the periphery of the upper lobes of the lungs bilaterally, similar to the prior study. Moderate cardiomegaly. The patient is rotated to the right on today's exam, resulting in distortion of the mediastinal contours and reduced diagnostic sensitivity and specificity for mediastinal pathology. Atherosclerotic calcifications are noted in the thoracic aorta. Old healed fracture of the lateral aspect of the left seventh rib incidentally noted. Orthopedic fixation hardware in the cervical spine incidentally noted. IMPRESSION: 1. The appearance the chest suggests congestive heart failure, as above. 2. Aortic  atherosclerosis. Electronically Signed   By: Vinnie Langton M.D.   On: 10/26/2021 06:01    Procedures Procedures   Medications Ordered in ED Medications - No data to display  ED Course  I have reviewed the triage vital signs and the nursing notes.  Pertinent labs & imaging results that were available during my care of the patient were reviewed by me and considered in my medical decision making (see chart for details).    MDM Rules/Calculators/A&P                           MDM  MSE complete  KEWANA SANON was evaluated in Emergency Department on 10/27/2021 for the symptoms described in the history of present illness. She was evaluated in the context of the global COVID-19 pandemic, which necessitated consideration that the patient might be at risk for infection with the SARS-CoV-2 virus that causes COVID-19. Institutional protocols and algorithms that pertain to the evaluation of patients at risk for COVID-19 are in a state of rapid change based on information released by regulatory bodies including the CDC and federal and state organizations. These policies and algorithms were followed during the patient's care in the ED.   Patient with known history of ESRD on a Monday, Wednesday, Friday dialysis schedule.  She represents today after being seen yesterday with same complaint.  Patient is complaining of significant shortness of breath especially with exertion.  Her last dialysis session was on Friday.  She was unable to go to dialysis today.  Patient's exam and findings are reassuring. however, patient with continued complaint of dyspnea especially with exertion.  Patient's case discussed with nephrology - Dr. Jonnie Finner -with plan to  admit to medicine and will plan for inpatient dialysis.   Hospitalist service is aware of case and will evaluate for admission.     Final Clinical Impression(s) / ED Diagnoses Final diagnoses:  SOB (shortness of breath)    Rx / DC Orders ED  Discharge Orders     None        Valarie Merino, MD 10/27/21 1427

## 2021-10-28 DIAGNOSIS — R0602 Shortness of breath: Secondary | ICD-10-CM | POA: Diagnosis not present

## 2021-10-28 LAB — CBC
HCT: 30.7 % — ABNORMAL LOW (ref 36.0–46.0)
Hemoglobin: 9.9 g/dL — ABNORMAL LOW (ref 12.0–15.0)
MCH: 30.7 pg (ref 26.0–34.0)
MCHC: 32.2 g/dL (ref 30.0–36.0)
MCV: 95.3 fL (ref 80.0–100.0)
Platelets: 217 10*3/uL (ref 150–400)
RBC: 3.22 MIL/uL — ABNORMAL LOW (ref 3.87–5.11)
RDW: 17.9 % — ABNORMAL HIGH (ref 11.5–15.5)
WBC: 5.5 10*3/uL (ref 4.0–10.5)
nRBC: 0.4 % — ABNORMAL HIGH (ref 0.0–0.2)

## 2021-10-28 LAB — BASIC METABOLIC PANEL
Anion gap: 16 — ABNORMAL HIGH (ref 5–15)
BUN: 55 mg/dL — ABNORMAL HIGH (ref 8–23)
CO2: 25 mmol/L (ref 22–32)
Calcium: 7.7 mg/dL — ABNORMAL LOW (ref 8.9–10.3)
Chloride: 92 mmol/L — ABNORMAL LOW (ref 98–111)
Creatinine, Ser: 7.66 mg/dL — ABNORMAL HIGH (ref 0.44–1.00)
GFR, Estimated: 5 mL/min — ABNORMAL LOW (ref >60)
Glucose, Bld: 115 mg/dL — ABNORMAL HIGH (ref 70–99)
Potassium: 4.3 mmol/L (ref 3.5–5.1)
Sodium: 133 mmol/L — ABNORMAL LOW (ref 135–145)

## 2021-10-28 LAB — HEPATITIS B SURFACE ANTIGEN: Hepatitis B Surface Ag: NONREACTIVE

## 2021-10-28 LAB — HEPATITIS B SURFACE ANTIBODY,QUALITATIVE: Hep B S Ab: REACTIVE — AB

## 2021-10-28 NOTE — Progress Notes (Signed)
AVS given and explained to patient. Ride (sister) waiting in the front entrance.

## 2021-10-28 NOTE — Progress Notes (Signed)
Pt receives out-pt HD at [facility] on MWF. Spoke to Clancy, Therapist, sports at [place] to make clinic aware of pt's d/c today and to resume care tomorrow.  Melven Sartorius Renal Navigator [phone]

## 2021-10-28 NOTE — Plan of Care (Signed)

## 2021-10-28 NOTE — Care Plan (Signed)
[place] [id number] June 13, 1951   Patient seen and examined at bedside during dialysis.  Tolerating treatment well.  Reports she is breathing much better.  Feels like she can go home and go back to outpatient HD tomorrow.    If d/c home today may return to regular schedule OP HD tomorrow. If admitted to inpatient status will complete full consult.   Jen Mow, PA-C [facility] Pager: [phone]

## 2021-10-28 NOTE — Discharge Summary (Signed)
Physician Discharge Summary  [name] NKN:[id number] DOB: 03/19/1951 DOA: 10/27/2021  PCP: [name], MD  Admit date: 10/27/2021 Discharge date: 10/28/2021  Time spent: 26 minutes  Recommendations for Outpatient Follow-up:  Needs renal panel CBC in 3 to 4 days with dialysis, needs iron levels in about 1 as an outpatient and adjustment of meds Outpatient further management per PCP and renal  Discharge Diagnoses:  MAIN problem for hospitalization   Mild dyspnea secondary to volume overload from sub-optimal dialysis  Please see below for itemized issues addressed in [facility]- refer to other progress notes for clarity if needed  Discharge Condition: Improved  Diet recommendation: Renal  Filed Weights   10/28/21 0518 10/28/21 0727  Weight: 57.5 kg 50.2 kg    History of present illness:   70 year old black female ESRD MWF with renal transplant 96, CAD diastolic failure prior CVA with left upper extremity weakness Developed shortness of breath on, hospital over the past several days she is on Augmentin-it looks that she received menance than her usual dialysis on last session and missed dialysis 11/14 Chest x-ray on admission showed mild infiltrates and effusions Renal was consulted and patient received dialysis--he did not have any acute oxygen requirements Patient also is supposed to see pulmonology for work-up of her dyspnea get PFTs in addition to them in the outpatient setting  She was dialyzed, felt better and felt ready to go home on discharge on day after admission   Discharge Exam: Vitals:   10/28/21 1000 10/28/21 1030  BP: (!) 107/41 (!) 157/64  Pulse:    Resp: 18 18  Temp:    SpO2: 100% 100%    Subj on day of d/c   Patient seen on HD unit doing fair no fever no chills shortness of breath is better She is 100% on room air  General Exam on discharge   [place] no icterus no pallor neck CTA B no added sound rales rhonchi Soft Fistula in right  upper extremity Old fistula site left Neurologically intact  Discharge Instructions   Discharge Instructions     Diet - low sodium heart healthy   Complete by: As directed    Discharge instructions   Complete by: As directed    Complete your antibiotics as previously directed Follow-up for routine dialysis in the outpatient setting Get screening labs as an outpatient   Increase activity slowly   Complete by: As directed    No wound care   Complete by: As directed       Allergies as of 10/28/2021       Reactions   Sulfa Antibiotics Other (See Comments)   Per patient, both parents allergic-so will not take   Adhesive [tape] Itching        Medication List     STOP taking these medications    sucralfate 1 g tablet Commonly known as: CARAFATE       TAKE these medications    acetaminophen 500 MG tablet Commonly known as: TYLENOL Take 1 tablet (500 mg total) by mouth every 6 (six) hours as needed. What changed:  how much to take reasons to take this   albuterol 108 (90 Base) MCG/ACT inhaler Commonly known as: VENTOLIN HFA Inhale 1-2 puffs into the lungs every 6 (six) hours as needed for wheezing or shortness of breath.   allopurinol 100 MG tablet Commonly known as: ZYLOPRIM Take 100 mg by mouth daily as needed (gout).   ALPRAZolam 0.25 MG tablet Commonly known as: XANAX Take  1 tablet (0.25 mg total) by mouth daily as needed for anxiety.   amoxicillin-clavulanate 500-125 MG tablet Commonly known as: Augmentin Take 1 tablet (500 mg total) by mouth 2 (two) times daily. What changed: additional instructions   atorvastatin 80 MG tablet Commonly known as: LIPITOR TAKE 1 TABLET (80 MG TOTAL) BY MOUTH DAILY.   busPIRone 10 MG tablet Commonly known as: BUSPAR TAKE 1 TABLET (10 MG TOTAL) BY MOUTH TWO TIMES DAILY. What changed:  how much to take how to take this when to take this reasons to take this   carbamide peroxide 6.5 % OTIC solution Commonly  known as: DEBROX Place 5 drops into the left ear 2 (two) times daily as needed. What changed: reasons to take this   cinacalcet 30 MG tablet Commonly known as: SENSIPAR Take 30 mg by mouth daily.   ondansetron 8 MG tablet Commonly known as: ZOFRAN Take 8 mg by mouth 2 (two) times daily as needed for nausea or vomiting.   pantoprazole 40 MG tablet Commonly known as: PROTONIX Take 1 tablet (40 mg total) by mouth daily.   zolpidem 10 MG tablet Commonly known as: AMBIEN Take 10 mg by mouth at bedtime as needed for sleep.       Allergies  Allergen Reactions   Sulfa Antibiotics Other (See Comments)    Per patient, both parents allergic-so will not take   Adhesive [Tape] Itching      The results of significant diagnostics from this hospitalization (including imaging, microbiology, ancillary and laboratory) are listed below for reference.    Significant Diagnostic Studies: DG Chest 2 View  Result Date: 10/27/2021 CLINICAL DATA:  Shortness of breath EXAM: CHEST - 2 VIEW COMPARISON:  Chest radiograph 10/26/2021 FINDINGS: The heart is enlarged, unchanged. The mediastinum appears widened, similar to the study from 10/13/2021. There is calcified atherosclerotic plaque throughout the thoracic aorta, unchanged. There is no focal consolidation. There are small bilateral pleural effusions, slightly increased in size. Again seen is cephalization of the pulmonary vasculature suggesting mild pulmonary interstitial edema. There is no pneumothorax. The bones are stable, including a remote fracture of the left seventh rib. IMPRESSION: 1. Slightly increased size of small bilateral pleural effusions and unchanged mild pulmonary interstitial edema. 2. Unchanged cardiomegaly and prominent mediastinum. Electronically Signed   By: Valetta Mole M.D.   On: 10/27/2021 08:02   DG Chest 2 View  Result Date: 10/26/2021 CLINICAL DATA:  70 year old female with history of shortness of breath since yesterday  evening. EXAM: CHEST - 2 VIEW COMPARISON:  Chest x-ray 10/13/2021. FINDINGS: Lung volumes are normal. No consolidative airspace disease. Trace bilateral pleural effusions. No pneumothorax. There is cephalization of the pulmonary vasculature and slight indistinctness of the interstitial markings suggestive of mild pulmonary edema. Patchy areas of mild scarring are noted in the periphery of the upper lobes of the lungs bilaterally, similar to the prior study. Moderate cardiomegaly. The patient is rotated to the right on today's exam, resulting in distortion of the mediastinal contours and reduced diagnostic sensitivity and specificity for mediastinal pathology. Atherosclerotic calcifications are noted in the thoracic aorta. Old healed fracture of the lateral aspect of the left seventh rib incidentally noted. Orthopedic fixation hardware in the cervical spine incidentally noted. IMPRESSION: 1. The appearance the chest suggests congestive heart failure, as above. 2. Aortic atherosclerosis. Electronically Signed   By: Vinnie Langton M.D.   On: 10/26/2021 06:01   DG Chest 2 View  Result Date: 10/13/2021 CLINICAL DATA:  70 year old female with  history of shortness of breath. EXAM: CHEST - 2 VIEW COMPARISON:  Chest x-ray 10/05/2021. FINDINGS: There is cephalization of the pulmonary vasculature and slight indistinctness of the interstitial markings suggestive of mild pulmonary edema. Small bilateral pleural effusions. No pneumothorax. Moderate cardiomegaly. The patient is rotated to the right on today's exam, resulting in distortion of the mediastinal contours and reduced diagnostic sensitivity and specificity for mediastinal pathology. Atherosclerotic calcifications in the thoracic aorta. IMPRESSION: 1. The appearance the chest suggests mild congestive heart failure, as above. 2. Aortic atherosclerosis. Electronically Signed   By: Vinnie Langton M.D.   On: 10/13/2021 05:45   DG Chest 2 View  Result Date:  10/05/2021 CLINICAL DATA:  Short of breath. EXAM: CHEST - 2 VIEW COMPARISON:  10/02/2021 FINDINGS: Cardiac enlargement. Tortuous aorta with coronary trace interstitial thickening identified compatible with mild edema. No pleural effusion or airspace consolidation. Osseous structures are intact. IMPRESSION: Mild CHF. Electronically Signed   By: Kerby Moors M.D.   On: 10/05/2021 07:01   DG Chest 2 View  Result Date: 10/02/2021 CLINICAL DATA:  Short of breath and fatigue EXAM: CHEST - 2 VIEW COMPARISON:  08/06/2021 FINDINGS: Cardiac enlargement with mild vascular congestion. Negative for heart failure or edema. Atherosclerotic aortic arch. Negative for infiltrate or effusion. IMPRESSION: Cardiac enlargement with mild vascular congestion. Negative for edema. Electronically Signed   By: Franchot Gallo M.D.   On: 10/02/2021 09:49   CT ABDOMEN PELVIS W CONTRAST  Result Date: 10/17/2021 CLINICAL DATA:  Abdominal pain EXAM: CT ABDOMEN AND PELVIS WITH CONTRAST TECHNIQUE: Multidetector CT imaging of the abdomen and pelvis was performed using the standard protocol following bolus administration of intravenous contrast. CONTRAST:  42mL OMNIPAQUE IOHEXOL 350 MG/ML SOLN COMPARISON:  CT abdomen and pelvis 08/10/2021 FINDINGS: Lower chest: Trace bilateral pleural effusions. Stable chronic appearing nodular densities in the bilateral lower lungs. Cardiomegaly and coronary artery disease. Reflux of contrast noted into the hepatic veins. Hepatobiliary: Liver is normal in size and contour with no suspicious mass identified. Gallbladder is surgically absent. No biliary ductal dilatation appreciated. Pancreas: Unremarkable. No pancreatic ductal dilatation or surrounding inflammatory changes. Spleen: Normal in size without focal abnormality. Adrenals/Urinary Tract: Adrenal glands appears stable and within normal limits. Kidneys are markedly atrophic without definite hydronephrosis. 7 cm cyst of the left kidney. Urinary bladder  is nondistended and not well evaluated. Stomach/Bowel: Small hiatal hernia. No evidence of bowel obstruction, free air or pneumatosis. Colonic diverticulosis. There appears to be wall thickening of the sigmoid colon which may be acute or chronic. Limited evaluation of bowel due to nondistention. No definite evidence of acute appendicitis, appendix not visualized. Vascular/Lymphatic: Severe atherosclerotic disease. 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 No bulky lymphadenopathy visualized. Reproductive: No suspicious adnexal mass visualized. Other: No frank ascites visualized. Stable amorphous density in the anterior right lower abdomen/pelvis. Musculoskeletal: Postsurgical changes and severe degenerative changes at L4-L5. Changes of renal osteodystrophy. IMPRESSION: 1. Colonic diverticulosis. Wall thickening of the sigmoid colon which appears similar to previous study may represent chronic or possibly mild acute on chronic diverticulitis. 2. Trace pleural effusions. 3. Numerous additional stable and chronic findings as described. Electronically Signed   By: Ofilia Neas M.D.   On: 10/17/2021 08:47    Microbiology: Recent Results (from the past 240 hour(s))  Resp Panel by RT-PCR (Flu A&B, Covid) Nasopharyngeal Swab     Status: None   Collection Time: 10/27/21  1:55 PM   Specimen: Nasopharyngeal Swab; Nasopharyngeal(NP) swabs in vial  transport medium  Result Value Ref Range Status   SARS Coronavirus 2 by RT PCR NEGATIVE NEGATIVE Final    Comment: (NOTE) SARS-CoV-2 target nucleic acids are NOT DETECTED.  The SARS-CoV-2 RNA is generally detectable in upper respiratory specimens during the acute phase of infection. The lowest concentration of SARS-CoV-2 viral copies this assay can detect is 138 copies/mL. A negative result does not preclude SARS-Cov-2 infection and should not be used as the sole basis for treatment or other patient management decisions. A negative result may  occur with  improper specimen collection/handling, submission of specimen other than nasopharyngeal swab, presence of viral mutation(s) within the areas targeted by this assay, and inadequate number of viral copies(<138 copies/mL). A negative result must be combined with clinical observations, patient history, and epidemiological information. The expected result is Negative.  Fact Sheet for Patients:  EntrepreneurPulse.com.au  Fact Sheet for Healthcare Providers:  IncredibleEmployment.be  This test is no t yet approved or cleared by the [place] FDA and  has been authorized for detection and/or diagnosis of SARS-CoV-2 by FDA under an Emergency Use Authorization (EUA). This EUA will remain  in effect (meaning this test can be used) for the duration of the COVID-19 declaration under Section 564(b)(1) of the Act, 21 U.S.C.section 360bbb-3(b)(1), unless the authorization is terminated  or revoked sooner.       Influenza A by PCR NEGATIVE NEGATIVE Final   Influenza B by PCR NEGATIVE NEGATIVE Final    Comment: (NOTE) The Xpert Xpress SARS-CoV-2/FLU/RSV plus assay is intended as an aid in the diagnosis of influenza from Nasopharyngeal swab specimens and should not be used as a sole basis for treatment. Nasal washings and aspirates are unacceptable for Xpert Xpress SARS-CoV-2/FLU/RSV testing.  Fact Sheet for Patients: EntrepreneurPulse.com.au  Fact Sheet for Healthcare Providers: IncredibleEmployment.be  This test is not yet approved or cleared by the [place] FDA and has been authorized for detection and/or diagnosis of SARS-CoV-2 by FDA under an Emergency Use Authorization (EUA). This EUA will remain in effect (meaning this test can be used) for the duration of the COVID-19 declaration under Section 564(b)(1) of the Act, 21 U.S.C. section 360bbb-3(b)(1), unless the authorization is terminated  or revoked.  Performed at [facility], [place] [address] [place], [place] [address]   Respiratory (~20 pathogens) panel by PCR     Status: None   Collection Time: 10/27/21  1:55 PM   Specimen: Nasopharyngeal Swab; Respiratory  Result Value Ref Range Status   Adenovirus NOT DETECTED NOT DETECTED Final   Coronavirus 229E NOT DETECTED NOT DETECTED Final    Comment: (NOTE) The Coronavirus on the Respiratory Panel, DOES NOT test for the novel  Coronavirus (2019 nCoV)    Coronavirus HKU1 NOT DETECTED NOT DETECTED Final   Coronavirus NL63 NOT DETECTED NOT DETECTED Final   Coronavirus OC43 NOT DETECTED NOT DETECTED Final   Metapneumovirus NOT DETECTED NOT DETECTED Final   Rhinovirus / Enterovirus NOT DETECTED NOT DETECTED Final   Influenza A NOT DETECTED NOT DETECTED Final   Influenza B NOT DETECTED NOT DETECTED Final   Parainfluenza Virus 1 NOT DETECTED NOT DETECTED Final   Parainfluenza Virus 2 NOT DETECTED NOT DETECTED Final   Parainfluenza Virus 3 NOT DETECTED NOT DETECTED Final   Parainfluenza Virus 4 NOT DETECTED NOT DETECTED Final   Respiratory Syncytial Virus NOT DETECTED NOT DETECTED Final   Bordetella pertussis NOT DETECTED NOT DETECTED Final   Bordetella Parapertussis NOT DETECTED NOT DETECTED Final   Chlamydophila  pneumoniae NOT DETECTED NOT DETECTED Final   Mycoplasma pneumoniae NOT DETECTED NOT DETECTED Final    Comment: Performed at [facility], [place] [address] [place], [place] [address]     Labs: Basic Metabolic Panel: Recent Labs  Lab 10/26/21 0518 10/27/21 0741 10/27/21 1600 10/28/21 0328  NA 138 137  --  133*  K 3.6 4.0  --  4.3  CL 96* 95*  --  92*  CO2 28 28  --  25  GLUCOSE 92 100*  --  115*  BUN 32* 45*  --  55*  CREATININE 5.41* 6.86*  --  7.66*  CALCIUM 7.4* 7.9*  --  7.7*  MG  --   --  2.2  --    Liver Function Tests: Recent Labs  Lab 10/26/21 0518  AST 20  ALT 12  ALKPHOS 124  BILITOT 1.0  PROT 6.8  ALBUMIN 2.8*   Recent  Labs  Lab 10/26/21 0518  LIPASE 41   No results for input(s): AMMONIA in the last 168 hours. CBC: Recent Labs  Lab 10/26/21 0518 10/27/21 0741 10/28/21 0328  WBC 5.5 5.9 5.5  HGB 9.7* 10.1* 9.9*  HCT 31.1* 31.5* 30.7*  MCV 98.7 96.6 95.3  PLT 220 216 217   Cardiac Enzymes: No results for input(s): CKTOTAL, CKMB, CKMBINDEX, TROPONINI in the last 168 hours. BNP: BNP (last 3 results) Recent Labs    10/02/21 0958 10/13/21 0452 10/27/21 2120  BNP 4,002.9* 3,948.4* 3,837.3*    ProBNP (last 3 results) No results for input(s): PROBNP in the last 8760 hours.  CBG: No results for input(s): GLUCAP in the last 168 hours.     Signed:  Nita Sells MD   [facility] 10/28/2021, 10:47 AM

## 2021-10-29 DIAGNOSIS — N2581 Secondary hyperparathyroidism of renal origin: Secondary | ICD-10-CM | POA: Diagnosis not present

## 2021-10-29 DIAGNOSIS — Z992 Dependence on renal dialysis: Secondary | ICD-10-CM | POA: Diagnosis not present

## 2021-10-29 DIAGNOSIS — K219 Gastro-esophageal reflux disease without esophagitis: Secondary | ICD-10-CM | POA: Diagnosis not present

## 2021-10-29 DIAGNOSIS — K5720 Diverticulitis of large intestine with perforation and abscess without bleeding: Secondary | ICD-10-CM | POA: Diagnosis not present

## 2021-10-29 DIAGNOSIS — N186 End stage renal disease: Secondary | ICD-10-CM | POA: Diagnosis not present

## 2021-10-29 DIAGNOSIS — E785 Hyperlipidemia, unspecified: Secondary | ICD-10-CM | POA: Diagnosis not present

## 2021-10-29 DIAGNOSIS — I722 Aneurysm of renal artery: Secondary | ICD-10-CM | POA: Diagnosis not present

## 2021-10-29 DIAGNOSIS — I5032 Chronic diastolic (congestive) heart failure: Secondary | ICD-10-CM | POA: Diagnosis not present

## 2021-10-29 DIAGNOSIS — F419 Anxiety disorder, unspecified: Secondary | ICD-10-CM | POA: Diagnosis not present

## 2021-10-29 DIAGNOSIS — I132 Hypertensive heart and chronic kidney disease with heart failure and with stage 5 chronic kidney disease, or end stage renal disease: Secondary | ICD-10-CM | POA: Diagnosis not present

## 2021-10-29 DIAGNOSIS — I480 Paroxysmal atrial fibrillation: Secondary | ICD-10-CM | POA: Diagnosis not present

## 2021-10-29 LAB — HEPATITIS B SURFACE ANTIBODY, QUANTITATIVE: Hep B S AB Quant (Post): >1000 m[IU]/mL (ref >9.9)

## 2021-10-31 DIAGNOSIS — N186 End stage renal disease: Secondary | ICD-10-CM | POA: Diagnosis not present

## 2021-10-31 DIAGNOSIS — N2581 Secondary hyperparathyroidism of renal origin: Secondary | ICD-10-CM | POA: Diagnosis not present

## 2021-10-31 DIAGNOSIS — Z992 Dependence on renal dialysis: Secondary | ICD-10-CM | POA: Diagnosis not present

## 2021-11-03 DIAGNOSIS — Z992 Dependence on renal dialysis: Secondary | ICD-10-CM | POA: Diagnosis not present

## 2021-11-03 DIAGNOSIS — N2581 Secondary hyperparathyroidism of renal origin: Secondary | ICD-10-CM | POA: Diagnosis not present

## 2021-11-03 DIAGNOSIS — N186 End stage renal disease: Secondary | ICD-10-CM | POA: Diagnosis not present

## 2021-11-04 DIAGNOSIS — K5720 Diverticulitis of large intestine with perforation and abscess without bleeding: Secondary | ICD-10-CM | POA: Diagnosis not present

## 2021-11-04 DIAGNOSIS — I722 Aneurysm of renal artery: Secondary | ICD-10-CM | POA: Diagnosis not present

## 2021-11-04 DIAGNOSIS — I132 Hypertensive heart and chronic kidney disease with heart failure and with stage 5 chronic kidney disease, or end stage renal disease: Secondary | ICD-10-CM | POA: Diagnosis not present

## 2021-11-04 DIAGNOSIS — I480 Paroxysmal atrial fibrillation: Secondary | ICD-10-CM | POA: Diagnosis not present

## 2021-11-04 DIAGNOSIS — F419 Anxiety disorder, unspecified: Secondary | ICD-10-CM | POA: Diagnosis not present

## 2021-11-04 DIAGNOSIS — K219 Gastro-esophageal reflux disease without esophagitis: Secondary | ICD-10-CM | POA: Diagnosis not present

## 2021-11-04 DIAGNOSIS — N186 End stage renal disease: Secondary | ICD-10-CM | POA: Diagnosis not present

## 2021-11-04 DIAGNOSIS — I5032 Chronic diastolic (congestive) heart failure: Secondary | ICD-10-CM | POA: Diagnosis not present

## 2021-11-04 DIAGNOSIS — E785 Hyperlipidemia, unspecified: Secondary | ICD-10-CM | POA: Diagnosis not present

## 2021-11-05 DIAGNOSIS — N186 End stage renal disease: Secondary | ICD-10-CM | POA: Diagnosis not present

## 2021-11-05 DIAGNOSIS — N2581 Secondary hyperparathyroidism of renal origin: Secondary | ICD-10-CM | POA: Diagnosis not present

## 2021-11-05 DIAGNOSIS — Z992 Dependence on renal dialysis: Secondary | ICD-10-CM | POA: Diagnosis not present

## 2021-11-07 ENCOUNTER — Emergency Department (HOSPITAL_COMMUNITY)
Admission: EM | Admit: 2021-11-07 | Discharge: 2021-11-07 | Disposition: A | Discharge status: Home / Self Care | Payer: Medicare HMO | Source: Home / Self Care | Attending: Emergency Medicine | Admitting: Emergency Medicine

## 2021-11-07 ENCOUNTER — Encounter (HOSPITAL_COMMUNITY): Payer: Self-pay

## 2021-11-07 ENCOUNTER — Emergency Department (HOSPITAL_COMMUNITY): Payer: Medicare HMO

## 2021-11-07 ENCOUNTER — Inpatient Hospital Stay (HOSPITAL_COMMUNITY)
Admission: EM | Admit: 2021-11-07 | Discharge: 2021-11-11 | DRG: 291 | Disposition: A | Discharge status: Home Health | Payer: Medicare HMO | Attending: Family Medicine | Admitting: Family Medicine

## 2021-11-07 ENCOUNTER — Other Ambulatory Visit: Payer: Self-pay

## 2021-11-07 DIAGNOSIS — I700 Atherosclerosis of aorta: Secondary | ICD-10-CM | POA: Diagnosis not present

## 2021-11-07 DIAGNOSIS — N2889 Other specified disorders of kidney and ureter: Secondary | ICD-10-CM

## 2021-11-07 DIAGNOSIS — J9601 Acute respiratory failure with hypoxia: Secondary | ICD-10-CM | POA: Diagnosis present

## 2021-11-07 DIAGNOSIS — J90 Pleural effusion, not elsewhere classified: Secondary | ICD-10-CM | POA: Diagnosis not present

## 2021-11-07 DIAGNOSIS — Z681 Body mass index (BMI) 19 or less, adult: Secondary | ICD-10-CM

## 2021-11-07 DIAGNOSIS — I132 Hypertensive heart and chronic kidney disease with heart failure and with stage 5 chronic kidney disease, or end stage renal disease: Principal | ICD-10-CM | POA: Diagnosis present

## 2021-11-07 DIAGNOSIS — Z86010 Personal history of colonic polyps: Secondary | ICD-10-CM | POA: Diagnosis not present

## 2021-11-07 DIAGNOSIS — R64 Cachexia: Secondary | ICD-10-CM | POA: Diagnosis present

## 2021-11-07 DIAGNOSIS — I151 Hypertension secondary to other renal disorders: Secondary | ICD-10-CM

## 2021-11-07 DIAGNOSIS — I2510 Atherosclerotic heart disease of native coronary artery without angina pectoris: Secondary | ICD-10-CM | POA: Insufficient documentation

## 2021-11-07 DIAGNOSIS — E785 Hyperlipidemia, unspecified: Secondary | ICD-10-CM | POA: Diagnosis present

## 2021-11-07 DIAGNOSIS — R4182 Altered mental status, unspecified: Secondary | ICD-10-CM | POA: Insufficient documentation

## 2021-11-07 DIAGNOSIS — F419 Anxiety disorder, unspecified: Secondary | ICD-10-CM

## 2021-11-07 DIAGNOSIS — E8770 Fluid overload, unspecified: Secondary | ICD-10-CM | POA: Diagnosis present

## 2021-11-07 DIAGNOSIS — K219 Gastro-esophageal reflux disease without esophagitis: Secondary | ICD-10-CM | POA: Diagnosis present

## 2021-11-07 DIAGNOSIS — Y830 Surgical operation with transplant of whole organ as the cause of abnormal reaction of the patient, or of later complication, without mention of misadventure at the time of the procedure: Secondary | ICD-10-CM | POA: Diagnosis present

## 2021-11-07 DIAGNOSIS — N2581 Secondary hyperparathyroidism of renal origin: Secondary | ICD-10-CM | POA: Diagnosis present

## 2021-11-07 DIAGNOSIS — Z981 Arthrodesis status: Secondary | ICD-10-CM

## 2021-11-07 DIAGNOSIS — I5033 Acute on chronic diastolic (congestive) heart failure: Secondary | ICD-10-CM | POA: Diagnosis present

## 2021-11-07 DIAGNOSIS — N186 End stage renal disease: Secondary | ICD-10-CM

## 2021-11-07 DIAGNOSIS — L89151 Pressure ulcer of sacral region, stage 1: Secondary | ICD-10-CM | POA: Diagnosis present

## 2021-11-07 DIAGNOSIS — R9431 Abnormal electrocardiogram [ECG] [EKG]: Secondary | ICD-10-CM | POA: Diagnosis not present

## 2021-11-07 DIAGNOSIS — E8779 Other fluid overload: Secondary | ICD-10-CM | POA: Diagnosis not present

## 2021-11-07 DIAGNOSIS — Z8673 Personal history of transient ischemic attack (TIA), and cerebral infarction without residual deficits: Secondary | ICD-10-CM | POA: Diagnosis not present

## 2021-11-07 DIAGNOSIS — Z87891 Personal history of nicotine dependence: Secondary | ICD-10-CM | POA: Diagnosis not present

## 2021-11-07 DIAGNOSIS — R0602 Shortness of breath: Secondary | ICD-10-CM

## 2021-11-07 DIAGNOSIS — R531 Weakness: Secondary | ICD-10-CM | POA: Diagnosis not present

## 2021-11-07 DIAGNOSIS — Z20822 Contact with and (suspected) exposure to covid-19: Secondary | ICD-10-CM | POA: Diagnosis present

## 2021-11-07 DIAGNOSIS — R34 Anuria and oliguria: Secondary | ICD-10-CM | POA: Diagnosis present

## 2021-11-07 DIAGNOSIS — I517 Cardiomegaly: Secondary | ICD-10-CM | POA: Diagnosis not present

## 2021-11-07 DIAGNOSIS — I959 Hypotension, unspecified: Secondary | ICD-10-CM | POA: Diagnosis present

## 2021-11-07 DIAGNOSIS — D631 Anemia in chronic kidney disease: Secondary | ICD-10-CM | POA: Diagnosis present

## 2021-11-07 DIAGNOSIS — M898X9 Other specified disorders of bone, unspecified site: Secondary | ICD-10-CM | POA: Diagnosis present

## 2021-11-07 DIAGNOSIS — T424X5A Adverse effect of benzodiazepines, initial encounter: Secondary | ICD-10-CM | POA: Diagnosis not present

## 2021-11-07 DIAGNOSIS — N189 Chronic kidney disease, unspecified: Secondary | ICD-10-CM | POA: Diagnosis not present

## 2021-11-07 DIAGNOSIS — Z882 Allergy status to sulfonamides status: Secondary | ICD-10-CM

## 2021-11-07 DIAGNOSIS — T8612 Kidney transplant failure: Secondary | ICD-10-CM | POA: Diagnosis present

## 2021-11-07 DIAGNOSIS — R54 Age-related physical debility: Secondary | ICD-10-CM | POA: Diagnosis present

## 2021-11-07 DIAGNOSIS — Z79899 Other long term (current) drug therapy: Secondary | ICD-10-CM | POA: Diagnosis not present

## 2021-11-07 DIAGNOSIS — R0902 Hypoxemia: Secondary | ICD-10-CM | POA: Diagnosis present

## 2021-11-07 DIAGNOSIS — Z823 Family history of stroke: Secondary | ICD-10-CM

## 2021-11-07 DIAGNOSIS — I10 Essential (primary) hypertension: Secondary | ICD-10-CM | POA: Diagnosis present

## 2021-11-07 DIAGNOSIS — Z992 Dependence on renal dialysis: Secondary | ICD-10-CM

## 2021-11-07 DIAGNOSIS — Z91048 Other nonmedicinal substance allergy status: Secondary | ICD-10-CM

## 2021-11-07 DIAGNOSIS — N9489 Other specified conditions associated with female genital organs and menstrual cycle: Secondary | ICD-10-CM | POA: Insufficient documentation

## 2021-11-07 DIAGNOSIS — T887XXA Unspecified adverse effect of drug or medicament, initial encounter: Secondary | ICD-10-CM

## 2021-11-07 DIAGNOSIS — I120 Hypertensive chronic kidney disease with stage 5 chronic kidney disease or end stage renal disease: Secondary | ICD-10-CM | POA: Diagnosis not present

## 2021-11-07 DIAGNOSIS — Z8249 Family history of ischemic heart disease and other diseases of the circulatory system: Secondary | ICD-10-CM

## 2021-11-07 DIAGNOSIS — Z800 Family history of malignant neoplasm of digestive organs: Secondary | ICD-10-CM

## 2021-11-07 DIAGNOSIS — N250 Renal osteodystrophy: Secondary | ICD-10-CM | POA: Diagnosis not present

## 2021-11-07 DIAGNOSIS — J811 Chronic pulmonary edema: Secondary | ICD-10-CM | POA: Diagnosis not present

## 2021-11-07 LAB — COMPREHENSIVE METABOLIC PANEL
ALT: 16 U/L (ref 0–44)
AST: 37 U/L (ref 15–41)
Albumin: 2.7 g/dL — ABNORMAL LOW (ref 3.5–5.0)
Alkaline Phosphatase: 195 U/L — ABNORMAL HIGH (ref 38–126)
Anion gap: 18 — ABNORMAL HIGH (ref 5–15)
BUN: 50 mg/dL — ABNORMAL HIGH (ref 8–23)
CO2: 25 mmol/L (ref 22–32)
Calcium: 7.7 mg/dL — ABNORMAL LOW (ref 8.9–10.3)
Chloride: 96 mmol/L — ABNORMAL LOW (ref 98–111)
Creatinine, Ser: 5.97 mg/dL — ABNORMAL HIGH (ref 0.44–1.00)
GFR, Estimated: 7 mL/min — ABNORMAL LOW (ref >60)
Glucose, Bld: 111 mg/dL — ABNORMAL HIGH (ref 70–99)
Potassium: 4.6 mmol/L (ref 3.5–5.1)
Sodium: 139 mmol/L (ref 135–145)
Total Bilirubin: 1.2 mg/dL (ref 0.3–1.2)
Total Protein: 7.1 g/dL (ref 6.5–8.1)

## 2021-11-07 LAB — CBC WITH DIFFERENTIAL/PLATELET
Abs Immature Granulocytes: 0.02 10*3/uL (ref 0.00–0.07)
Abs Immature Granulocytes: 0.02 10*3/uL (ref 0.00–0.07)
Basophils Absolute: 0 10*3/uL (ref 0.0–0.1)
Basophils Absolute: 0 10*3/uL (ref 0.0–0.1)
Basophils Relative: 1 %
Basophils Relative: 1 %
Eosinophils Absolute: 0.1 10*3/uL (ref 0.0–0.5)
Eosinophils Absolute: 0 10*3/uL (ref 0.0–0.5)
Eosinophils Relative: 2 %
Eosinophils Relative: 1 %
HCT: 31.4 % — ABNORMAL LOW (ref 36.0–46.0)
HCT: 31.4 % — ABNORMAL LOW (ref 36.0–46.0)
Hemoglobin: 9.8 g/dL — ABNORMAL LOW (ref 12.0–15.0)
Hemoglobin: 9.7 g/dL — ABNORMAL LOW (ref 12.0–15.0)
Immature Granulocytes: 0 %
Immature Granulocytes: 0 %
Lymphocytes Relative: 27 %
Lymphocytes Relative: 19 %
Lymphs Abs: 1.3 10*3/uL (ref 0.7–4.0)
Lymphs Abs: 1.3 10*3/uL (ref 0.7–4.0)
MCH: 30.2 pg (ref 26.0–34.0)
MCH: 29.8 pg (ref 26.0–34.0)
MCHC: 31.2 g/dL (ref 30.0–36.0)
MCHC: 30.9 g/dL (ref 30.0–36.0)
MCV: 96.9 fL (ref 80.0–100.0)
MCV: 96.6 fL (ref 80.0–100.0)
Monocytes Absolute: 0.6 10*3/uL (ref 0.1–1.0)
Monocytes Absolute: 0.8 10*3/uL (ref 0.1–1.0)
Monocytes Relative: 12 %
Monocytes Relative: 12 %
Neutro Abs: 2.8 10*3/uL (ref 1.7–7.7)
Neutro Abs: 4.4 10*3/uL (ref 1.7–7.7)
Neutrophils Relative %: 58 %
Neutrophils Relative %: 67 %
Platelets: 266 10*3/uL (ref 150–400)
Platelets: 280 10*3/uL (ref 150–400)
RBC: 3.24 MIL/uL — ABNORMAL LOW (ref 3.87–5.11)
RBC: 3.25 MIL/uL — ABNORMAL LOW (ref 3.87–5.11)
RDW: 17.5 % — ABNORMAL HIGH (ref 11.5–15.5)
RDW: 17.7 % — ABNORMAL HIGH (ref 11.5–15.5)
WBC: 4.9 10*3/uL (ref 4.0–10.5)
WBC: 6.6 10*3/uL (ref 4.0–10.5)
nRBC: 0 % (ref 0.0–0.2)
nRBC: 0 % (ref 0.0–0.2)

## 2021-11-07 LAB — I-STAT CHEM 8, ED
BUN: 36 mg/dL — ABNORMAL HIGH (ref 8–23)
Calcium, Ion: 0.84 mmol/L — CL (ref 1.15–1.40)
Chloride: 99 mmol/L (ref 98–111)
Creatinine, Ser: 5.5 mg/dL — ABNORMAL HIGH (ref 0.44–1.00)
Glucose, Bld: 89 mg/dL (ref 70–99)
HCT: 32 % — ABNORMAL LOW (ref 36.0–46.0)
Hemoglobin: 10.9 g/dL — ABNORMAL LOW (ref 12.0–15.0)
Potassium: 3.9 mmol/L (ref 3.5–5.1)
Sodium: 138 mmol/L (ref 135–145)
TCO2: 29 mmol/L (ref 22–32)

## 2021-11-07 LAB — TROPONIN I (HIGH SENSITIVITY)
Troponin I (High Sensitivity): 33 ng/L — ABNORMAL HIGH (ref <18)
Troponin I (High Sensitivity): 28 ng/L — ABNORMAL HIGH (ref <18)

## 2021-11-07 LAB — MAGNESIUM: Magnesium: 1.7 mg/dL (ref 1.7–2.4)

## 2021-11-07 LAB — RESP PANEL BY RT-PCR (FLU A&B, COVID) ARPGX2
Influenza A by PCR: NEGATIVE
Influenza B by PCR: NEGATIVE
SARS Coronavirus 2 by RT PCR: NEGATIVE

## 2021-11-07 MED ORDER — CINACALCET HCL 30 MG PO TABS: 30.0000 mg | ORAL_TABLET | ORAL | Status: DC

## 2021-11-07 MED ORDER — DOXERCALCIFEROL 4 MCG/2ML IV SOLN
2.0000 ug | INTRAVENOUS | Status: DC
  Filled 2021-11-07: qty 2

## 2021-11-07 MED ORDER — ACETAMINOPHEN 325 MG PO TABS
650.0000 mg | ORAL_TABLET | Freq: Four times a day (QID) | ORAL | Status: DC | PRN
  Administered 2021-11-08: 650 mg via ORAL
  Filled 2021-11-07: qty 2

## 2021-11-07 MED ORDER — CINACALCET HCL 30 MG PO TABS
30.0000 mg | ORAL_TABLET | Freq: Every day | ORAL | Status: DC
  Administered 2021-11-08: 30 mg via ORAL
  Filled 2021-11-07: qty 1

## 2021-11-07 MED ORDER — MELATONIN 3 MG PO TABS
3.0000 mg | ORAL_TABLET | Freq: Every evening | ORAL | Status: DC | PRN
  Administered 2021-11-09 – 2021-11-10 (×2): 3 mg via ORAL
  Filled 2021-11-07 (×3): qty 1

## 2021-11-07 MED ORDER — ALPRAZOLAM 0.25 MG PO TABS
0.2500 mg | ORAL_TABLET | Freq: Every day | ORAL | Status: DC | PRN
  Administered 2021-11-08 – 2021-11-10 (×2): 0.25 mg via ORAL
  Filled 2021-11-07 (×3): qty 1

## 2021-11-07 MED ORDER — ACETAMINOPHEN 650 MG RE SUPP: 650.0000 mg | Freq: Four times a day (QID) | RECTAL | Status: DC | PRN

## 2021-11-07 MED ORDER — LOSARTAN POTASSIUM 50 MG PO TABS
50.0000 mg | ORAL_TABLET | Freq: Every day | ORAL | Status: DC
  Filled 2021-11-07: qty 1

## 2021-11-07 MED ORDER — PANTOPRAZOLE SODIUM 40 MG PO TBEC
40.0000 mg | DELAYED_RELEASE_TABLET | Freq: Every day | ORAL | Status: DC
  Administered 2021-11-08 – 2021-11-11 (×4): 40 mg via ORAL
  Filled 2021-11-07 (×4): qty 1

## 2021-11-07 MED ORDER — ALBUTEROL SULFATE HFA 108 (90 BASE) MCG/ACT IN AERS: 2.0000 | INHALATION_SPRAY | RESPIRATORY_TRACT | Status: DC | PRN

## 2021-11-07 MED ORDER — CHLORHEXIDINE GLUCONATE CLOTH 2 % EX PADS: 6.0000 | MEDICATED_PAD | Freq: Every day | CUTANEOUS | Status: DC

## 2021-11-07 MED ORDER — ATORVASTATIN CALCIUM 80 MG PO TABS
80.0000 mg | ORAL_TABLET | Freq: Every day | ORAL | Status: DC
  Administered 2021-11-08 – 2021-11-10 (×3): 80 mg via ORAL
  Filled 2021-11-07 (×3): qty 1

## 2021-11-07 NOTE — ED Triage Notes (Signed)
BIB [facility] after pt's mother called to report pt having increase SHOB x 2 days. Per EMS, pt was 89% on RA, pt placed on 3 L [place] and pt 02 sat brought back to 97%. PT is a MWF dialysis pt. PT has had increase called to EMS over the last few days.  BIL fistulas  HX: CHF, HTN, ESRD

## 2021-11-07 NOTE — ED Notes (Signed)
Reviewed discharge instructions with patient and son. Follow-up care reviewed. Patient and son verbalized understanding. Patient A&Ox4, VSS, and ambulatory with steady gait upon discharge.

## 2021-11-07 NOTE — ED Provider Notes (Addendum)
  Physical Exam  BP (!) 125/47   Pulse 97   Temp (!) 97.5 F (36.4 C) (Oral)   Resp 18   SpO2 94%   Physical Exam  ED Course/Procedures     .Critical Care Performed by: Varney Biles, MD Authorized by: Varney Biles, MD   Critical care provider statement:    Critical care time (minutes):  30   Critical care was necessary to treat or prevent imminent or life-threatening deterioration of the following conditions:  CNS failure or compromise   Critical care was time spent personally by me on the following activities:  Development of treatment plan with patient or surrogate, examination of patient, ordering and review of laboratory studies, ordering and review of radiographic studies, ordering and performing treatments and interventions, pulse oximetry, re-evaluation of patient's condition and review of old charts ARTERIAL BLOOD GAS  Date/Time: 11/07/2021 8:55 AM Performed by: Varney Biles, MD Authorized by: Varney Biles, MD   Consent:    Consent obtained:  Verbal   Consent given by:  Patient   Risks, benefits, and alternatives were discussed: yes     Risks discussed:  AV fistula Universal protocol:    Procedure explained and questions answered to patient or proxy's satisfaction: yes     Patient identity confirmed:  Arm band Anesthesia:    Anesthesia method:  None Procedure details:    Location:  L radial   Allen's test performed: no     Number of attempts:  1 Post-procedure details:    Dressing applied: yes     Bleeding control:  Hemostasis achieved   Circulation, movement, and sensation:  Unchanged   Procedure completion:  Tolerated Comments:     Arterial stick done primarily to get blood work completed  MDM   Allegedly: Increase DIB x 2 days. Pt's mother called 65 Pt is sleepy. No resp distress, but placed on oxygen for hypoxia. She is on sleep meds. She has hx of ESRD on HD.  Dr. Dayna Barker spoke with family. Apparently hx of anxiety attacks leading to her  calling 911 in the past.  Labs pending. Will need reassessment for depressed mentation - thought to be due to medications.    Varney Biles, MD 11/07/21 0740  8:53 AM Nursing staff informed us that patient failed attempts at blood draw by them and also the phlebotomist.  I reassessed the patient.  She is now more alert, responding appropriately.  Informs me that her dialysis are Monday-Wednesday-Friday, typically she gets that at noon.  She called 911 today because she just needed " fresh air".  At the moment she does not have any chest pain, shortness of breath.  She has poor vascular access.  We decided to get art stick to get her labs.  I have discontinued troponin and BNP, as I think they are inconsequential.  9:36 AM Discussed case with patient's son at 9 AM.  Informed that patient will be ready for discharge soon.  He needed some time to figure out pickup of the patient.  At 930, I left a message for him that patient's work-up is normal and patient is ready for discharge.   Varney Biles, MD 11/07/21 [phone]

## 2021-11-07 NOTE — H&P (Signed)
History and Physical    PLEASE NOTE THAT DRAGON DICTATION SOFTWARE WAS USED IN THE CONSTRUCTION OF THIS NOTE.   [name] YWV:[id number] DOB: 1951-09-06 DOA: 11/07/2021  PCP: [name], MD  Patient coming from: home   I have personally briefly reviewed patient's old medical records in [place]  Chief Complaint: Shortness of breath  HPI: [name] is a 70 y.o. female with medical history significant for end-stage renal disease on hemodialysis on Monday, Wednesday, Friday schedule, anemia of chronic kidney disease, chronic diastolic heart failure, hypertension, hyperlipidemia, chronically elevated troponin, who is admitted to [facility] on 11/07/2021 with acute on chronic diastolic heart failure after presenting from home to [facility] ED complaining of shortness of breath.   The patient reports 2 days of progressive shortness of breath associated with orthopnea, PND, and worsening of edema in the bilateral lower extremities.  She acknowledges a history of end-stage renal disease on hemodialysis for which she is on Monday, Wednesday, Friday schedule, with most recent completed hemodialysis session occurring on Monday, 11/03/2021.  She missed her scheduled hemodialysis session on Wednesday, 85/46/2703 due to complications relating to transportation issues, and noted her presenting shortness of breath started after this missed HD session.  She presented with this complaint to [facility] emergency department yesterday, 11/06/2021, which was felt to be on the basis of acute volume overload in the setting of aforementioned to missed hemodialysis session.  At that time, she was maintaining oxygen saturations in the high 90s on room air, and subsequently discharged home from the emergency department after arrangements were made for or a leak of hemodialysis session to occur today (Friday, 11/07/21).  However, the patient acknowledges that she also missed this hemodialysis  session today, in the setting of further progression of her shortness of breath presents back to [facility] emergency department for further evaluation and management thereof.  Denies any associated chest pain, palpitations, diaphoresis, nausea, vomiting, dizziness, presyncope, or syncope.  Not associate with any hemoptysis, new onset calf tenderness, new lower extremity erythema.  No recent trauma.  Not associate with any subjective fever, chills, rigors, or generalized myalgias.  Denies any chronic underlying pulmonary pathology, and confirms no baseline supplemental oxygen requirements.  She is anuric at baseline.   Medical history notable for chronic diastolic heart failure, with most recent echocardiogram occurring in March 2022 demonstrating LVEF 60 to 50%, grade 2 diastolic dysfunction, normal right ventricular systolic function, moderately dilated left atrium, mild mitral regurgitation, and moderate tricuspid regurgitation.  Chart review, she has a history of chronically elevated high-sensitivity troponin I, with prior values notable for the following: Most recent prior value 21 on 10/13/2021, 26 on 07/28/2019, and 173 on 05/18/2021.  She also has chronically elevated BNP associated with baseline range of [phone], with most recent prior BNP and to be 3837 on 10/27/2021.   ED Course:  Vital signs in the ED were notable for the following:  -Afebrile; heart rate 123; blood pressure 115/67 -133/66; respiratory rate 17-23; as reported to me, initial oxygen saturation noted to be in the high 80s on room air, with ensuing improvement to 99 to 100% on 4 L nasal cannula.  Labs were notable for the following: CMP notable for the following: Potassium 4.6, bicarbonate 25, anion gap 18.  CBC notable for white blood cell count 6600, hemoglobin 9.7 relative to most recent prior value 9.9 on 10/28/2021.  High-sensitivity troponin I 33.  BNP ordered, with result currently pending.  Screening  COVID-19/[place] PCR  checked in the ED today and found to be negative.  Imaging/EKG performed in the ED today notable for the following: EKG, in comparison to most recent prior from 10/28/2021 shows sinus tachycardia, heart rate 102, prolonged QTC of 510ms, T wave inversion in V1/V2, unchanged from most recent prior EKG, and no evidence of ST changes, including no evidence of ST elevation.  Chest x-ray shows cardiomegaly with interstitial and pulmonary edema in the absence of overt infiltrate, pleural effusion, or pneumothorax.  EDP: Discussed patient's case with the on-call nephrologist, who will consult and plans for hemodialysis in the morning (11/08/21).     Review of Systems: As per HPI otherwise 10 point review of systems negative.   Past Medical History:  Diagnosis Date   Adenomatous polyp of colon 10/2010, 2006, 2015   Anemia in CKD (chronic kidney disease) 11/07/2012   s/p blood transfusion.    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  Had some early rejection then stable function for years, then had slow decline of function and went back on hemodialysis in 2012.  Gets HD TTS schedule at [facility] still using L forearm AVF.      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[place])    left upper arm   Pulmonary edema 12/2019   Stroke ([place]) 11/2015   TIA   Weight loss, unintentional     Past Surgical History:  Procedure Laterality Date   AV FISTULA PLACEMENT      for dialysis   AV FISTULA PLACEMENT Left 11/22/2015   Procedure: ARTERIOVENOUS (AV) FISTULA CREATION-LEFT BRACHIOCEPHALIC;  Surgeon: Serafina Mitchell, MD;  Location: [place];  Service: Vascular;  Laterality: Left;   AV FISTULA PLACEMENT Right 03/15/2020   Procedure: INSERTION OF ARTERIOVENOUS (AV) GORE-TEX GRAFT ARM ( BRACHIAL AXILLARY );  Surgeon: Katha Cabal, MD;  Location: [facility];  Service: Vascular;  Laterality: Right;   BACK SURGERY     CERVICAL FUSION     CHOLECYSTECTOMY  12/02/2012   Procedure: LAPAROSCOPIC CHOLECYSTECTOMY WITH INTRAOPERATIVE CHOLANGIOGRAM;  Surgeon: Edward Jolly, MD;  Location: [place];  Service: General;  Laterality: N/A;   EYE SURGERY Bilateral    cataract surgery   EYE SURGERY Left 2019   laser   HEMATOMA EVACUATION Left 12/24/2016   Procedure: EVACUATION HEMATOMA LEFT UPPER ARM;  Surgeon: Waynetta Sandy, MD;  Location: [place];  Service: Vascular;  Laterality: Left;   I & D EXTREMITY Left 12/31/2016   Procedure: IRRIGATION AND DEBRIDEMENT EXTREMITY;  Surgeon: Angelia Mould, MD;  Location: [place];  Service: Vascular;  Laterality: Left;   INSERTION OF DIALYSIS CATHETER Right 12/24/2016   Procedure: INSERTION OF DIALYSIS CATHETER;  Surgeon: Waynetta Sandy, MD;  Location: [place];  Service: Vascular;  Laterality: Right;   INSERTION OF DIALYSIS CATHETER  Right 02/04/2017   Procedure: INSERTION OF DIALYSIS CATHETER;  Surgeon: Waynetta Sandy, MD;  Location: [place];  Service: Vascular;  Laterality: Right;   [place] Left 10/23/2016   Procedure: Fistulagram;  Surgeon: Elam Dutch, MD;  Location: [place] [facility];  Service: Cardiovascular;  Laterality: Left;   PERIPHERAL VASCULAR THROMBECTOMY Right 04/16/2020   Procedure: PERIPHERAL VASCULAR THROMBECTOMY;  Surgeon: Katha Cabal, MD;  Location: [place] [facility];  Service: Cardiovascular;  Laterality: Right;    RESECTION OF ARTERIOVENOUS FISTULA ANEURYSM Left 11/22/2015   Procedure: RESECTION OF LEFT RADIOCEPHALIC FISTULA ANEURYSM ;  Surgeon: Serafina Mitchell, MD;  Location: [place] [facility];  Service: Vascular;  Laterality: Left;   REVISON OF ARTERIOVENOUS FISTULA Left 12/22/2016   Procedure: REVISON OF LEFT ARTERIOVENOUS FISTULA;  Surgeon: Waynetta Sandy, MD;  Location: [place];  Service: Vascular;  Laterality: Left;   REVISON OF ARTERIOVENOUS FISTULA Left 02/04/2017   Procedure: REVISON OF LEFT UPPER ARM ARTERIOVENOUS FISTULA;  Surgeon: Waynetta Sandy, MD;  Location: [place];  Service: Vascular;  Laterality: Left;   UPPER EXTREMITY ANGIOGRAPHY Bilateral 09/19/2019   Procedure: UPPER EXTREMITY ANGIOGRAPHY;  Surgeon: Katha Cabal, MD;  Location: [place] [facility];  Service: Cardiovascular;  Laterality: Bilateral;    Social History:  reports that she quit smoking about 29 years ago. Her smoking use included cigarettes. She has never used smokeless tobacco. She reports that she does not drink alcohol and does not use drugs.   Allergies  Allergen Reactions   Sulfa Antibiotics Other (See Comments)    Per patient, both parents allergic-so will not take   Adhesive [Tape] Itching    Family History  Problem Relation Age of Onset   Colon cancer Brother    Cancer Brother    Coronary artery disease Mother 49   Hyperlipidemia Mother    Hypertension Mother    Stroke Maternal Aunt    Esophageal cancer Neg Hx    Stomach cancer Neg Hx    Rectal cancer Neg Hx     Family history reviewed and not pertinent    Prior to Admission medications   Medication Sig Start Date End Date Taking? Authorizing Provider  acetaminophen (TYLENOL) 500 MG tablet Take 1 tablet (500 mg total) by mouth every 6 (six) hours as needed. Patient taking differently: Take 1,000 mg by mouth every 6 (six) hours as needed for mild pain. 09/05/18   Law, Bea Graff, PA-C  albuterol (VENTOLIN HFA) 108 (90 Base) MCG/ACT inhaler  Inhale 1-2 puffs into the lungs every 6 (six) hours as needed for wheezing or shortness of breath. 10/26/21   Jeanell Sparrow, DO  allopurinol (ZYLOPRIM) 100 MG tablet Take 100 mg by mouth daily as needed (gout).    [provider]  allopurinol (ZYLOPRIM) 300 MG tablet Take by mouth. 11/03/21   [provider]  ALPRAZolam Duanne Moron) 0.25 MG tablet Take 1 tablet (0.25 mg total) by mouth daily as needed for anxiety. 08/18/21   Nita Sells, MD  amoxicillin-clavulanate (AUGMENTIN) 500-125 MG tablet Take 1 tablet (500 mg total) by mouth 2 (two) times daily. Patient taking differently: Take 1 tablet by mouth 2 (two) times daily. Start date: 10/17/21 10/17/21   Domenic Moras, PA-C  atorvastatin (LIPITOR) 80 MG tablet TAKE 1 TABLET (80 MG TOTAL) BY MOUTH DAILY. Patient taking differently: Take 80 mg by mouth daily. 02/25/21 02/25/22  Dessa Phi, DO  busPIRone (BUSPAR) 10 MG tablet  TAKE 1 TABLET (10 MG TOTAL) BY MOUTH TWO TIMES DAILY. Patient taking differently: Take 10 mg by mouth 2 (two) times daily as needed (anxiety). 08/18/21 08/18/22  Nita Sells, MD  carbamide peroxide (DEBROX) 6.5 % OTIC solution Place 5 drops into the left ear 2 (two) times daily as needed. Patient taking differently: Place 5 drops into the left ear 2 (two) times daily as needed (earwax). 10/26/21   Jeanell Sparrow, DO  cinacalcet (SENSIPAR) 30 MG tablet Take 30 mg by mouth daily. 01/04/20   [provider]  losartan (COZAAR) 50 MG tablet Take 50 mg by mouth daily. 11/03/21   [provider]  ondansetron (ZOFRAN) 8 MG tablet Take 8 mg by mouth 2 (two) times daily as needed for nausea or vomiting.     [provider]  pantoprazole (PROTONIX) 40 MG tablet Take 1 tablet (40 mg total) by mouth daily. 08/18/21   Nita Sells, MD  zolpidem (AMBIEN) 10 MG tablet Take 10 mg by mouth at bedtime as needed for sleep. 02/13/21   [provider]     Objective    Physical  Exam: Vitals:   11/07/21 2030 11/07/21 2100 11/07/21 2130 11/07/21 2200  BP: (!) 128/100 133/66 115/67 (!) 139/44  Pulse: (!) 103 (!) 103 100 (!) 102  Resp: (!) 23 17 19 20   SpO2: 100% 100% 100% 100%  Weight:      Height:        General: appears to be stated age; alert, oriented; mildly increased work of breathing noted Skin: warm, dry, no rash Head:  AT/[place] Mouth:  Oral mucosa membranes appear moist, normal dentition Neck: supple; trachea midline Heart:  RRR; did not appreciate any M/R/G Lungs: Bibasilar crackles noted, but otherwise CTAB;  did not appreciate any wheezes or rhonchi Abdomen: + BS; soft, ND, NT Vascular: 2+ pedal pulses b/l; 2+ radial pulses b/l Extremities: 1-2+ edema in the bilateral lower extremities;, no muscle wasting Neuro: strength and sensation intact in upper and lower extremities b/l  Labs on Admission: I have personally reviewed following labs and imaging studies  CBC: Recent Labs  Lab 11/07/21 0852 11/07/21 0927 11/07/21 2033  WBC 4.9  --  6.6  NEUTROABS 2.8  --  4.4  HGB 9.8* 10.9* 9.7*  HCT 31.4* 32.0* 31.4*  MCV 96.9  --  96.6  PLT 266  --  510   Basic Metabolic Panel: Recent Labs  Lab 11/07/21 0927 11/07/21 2033  NA 138 139  K 3.9 4.6  CL 99 96*  CO2  --  25  GLUCOSE 89 111*  BUN 36* 50*  CREATININE 5.50* 5.97*  CALCIUM  --  7.7*   GFR: Estimated Creatinine Clearance: 6.8 mL/min (A) (by C-G formula based on SCr of 5.97 mg/dL (H)). Liver Function Tests: Recent Labs  Lab 11/07/21 2033  AST 37  ALT 16  ALKPHOS 195*  BILITOT 1.2  PROT 7.1  ALBUMIN 2.7*   No results for input(s): LIPASE, AMYLASE in the last 168 hours. No results for input(s): AMMONIA in the last 168 hours. Coagulation Profile: No results for input(s): INR, PROTIME in the last 168 hours. Cardiac Enzymes: No results for input(s): CKTOTAL, CKMB, CKMBINDEX, TROPONINI in the last 168 hours. BNP (last 3 results) No results for input(s): PROBNP in the last  8760 hours. HbA1C: No results for input(s): HGBA1C in the last 72 hours. CBG: No results for input(s): GLUCAP in the last 168 hours. Lipid Profile: No results for input(s): CHOL,  HDL, LDLCALC, TRIG, CHOLHDL, LDLDIRECT in the last 72 hours. Thyroid Function Tests: No results for input(s): TSH, T4TOTAL, FREET4, T3FREE, THYROIDAB in the last 72 hours. Anemia Panel: No results for input(s): VITAMINB12, FOLATE, FERRITIN, TIBC, IRON, RETICCTPCT in the last 72 hours. Urine analysis:    Component Value Date/Time   COLORURINE YELLOW 01/30/2011 1648   APPEARANCEUR CLOUDY (A) 01/30/2011 1648   LABSPEC 1.016 01/30/2011 1648   PHURINE 5.0 01/30/2011 1648   GLUCOSEU NEGATIVE 10/14/2009 0747   HGBUR SMALL (A) 01/30/2011 1648   BILIRUBINUR NEGATIVE 01/30/2011 1648   KETONESUR NEGATIVE 01/30/2011 1648   PROTEINUR 100 (A) 01/30/2011 1648   UROBILINOGEN 0.2 01/30/2011 1648   NITRITE NEGATIVE 01/30/2011 1648   LEUKOCYTESUR SMALL (A) 01/30/2011 1648    Radiological Exams on Admission: DG Chest 2 View  Result Date: 11/07/2021 CLINICAL DATA:  70 year old female with history of shortness of breath and weakness. EXAM: CHEST - 2 VIEW COMPARISON:  Chest x-ray 10/27/2021. FINDINGS: There is cephalization of the pulmonary vasculature and slight indistinctness of the interstitial markings suggestive of mild pulmonary edema. Trace bilateral pleural effusions. No pneumothorax. Heart size is mildly enlarged. Upper mediastinal contours are remarkable for scratch the upper mediastinal contours are within normal limits. Atherosclerotic calcifications in the thoracic aorta. IMPRESSION: 1. Cardiomegaly with evidence of mild interstitial pulmonary edema and trace bilateral pleural effusions; imaging findings concerning for mild congestive heart failure. 2. Aortic atherosclerosis. Electronically Signed   By: Vinnie Langton M.D.   On: 11/07/2021 05:47   CT Head Wo Contrast  Result Date: 11/07/2021 CLINICAL DATA:   Mental status change EXAM: CT HEAD WITHOUT CONTRAST TECHNIQUE: Contiguous axial images were obtained from the base of the skull through the vertex without intravenous contrast. COMPARISON:  CT head 02/21/2021 FINDINGS: Brain: Moderate to advanced cortical atrophy most prominent in the frontal and temporal lobes. Mild ventricular enlargement consistent with atrophy. Moderate white matter hypodensity bilaterally. Negative for acute infarct, hemorrhage, mass Vascular: Atherosclerotic calcification. Negative for hyperdense vessel Skull: No focal skull lesion. Degenerative changes C1-2 with mild spinal stenosis Sinuses/Orbits: Mild mucosal edema right maxillary sinus. Remaining sinuses clear. Bilateral cataract extraction. Other: None IMPRESSION: Atrophy and chronic microvascular ischemic change. No acute abnormality. Electronically Signed   By: Franchot Gallo M.D.   On: 11/07/2021 08:26   DG Chest Portable 1 View  Result Date: 11/07/2021 CLINICAL DATA:  Initial evaluation for volume overload, missed dialysis. EXAM: PORTABLE CHEST 1 VIEW COMPARISON:  Prior radiograph from earlier the same day. FINDINGS: Marked cardiomegaly again seen. Mediastinal silhouette stable. Extensive aortic atherosclerosis. Lungs mildly hypoinflated. Persistent diffuse pulmonary interstitial edema, relatively similar as compared to prior radiograph performed earlier the same day. No visible pleural effusion on this single AP view of the chest. No new consolidative airspace disease. No pneumothorax. No new osseous abnormality. Remotely healed left-sided rib fracture noted. IMPRESSION: 1. Cardiomegaly with diffuse pulmonary interstitial edema, relatively similar as compared to prior radiograph performed earlier the same day. 2.  Aortic Atherosclerosis (ICD10-I70.0). Electronically Signed   By: Jeannine Boga M.D.   On: 11/07/2021 21:37     EKG: Independently reviewed, with result as described above.    Assessment/Plan   Principal  Problem:   Acute on chronic diastolic CHF (congestive heart failure) (HCC) Active Problems:   End-stage renal disease on hemodialysis (HCC)   SOB (shortness of breath)   HTN (hypertension)   Hyperlipidemia   Prolonged Q-T interval on ECG   Volume overload   Acute respiratory failure with hypoxia (  HCC)   GERD (gastroesophageal reflux disease)    #) Acute on chronic diastolic heart failure: dx of acute decompensation on the basis of 2 days of progressive shortness of breath associated with orthopnea, PND, worsening of edema bilateral lower extremities, new supplemental oxygen requirement ,with chest x-ray showing evidence of interstitial pulmonary edema, which appears to be on the basis of missing her 2 most recently scheduled hemodialysis sessions, particularly given that the symptoms started after missing the first of these 2 sessions. This is in the context of a known history of chronic diastolic heart failure, with most recent echocardiogram performed in March 2022 showing LVEF 60 to 65% as well as grade 2 diastolic dysfunction, as further detailed above.  I suspect that mildly elevated initial troponin is a consequence of underlying acutely decompensated heart failure as well as exacerbation of diminished renal clearance in the setting of multiple missed hemodialysis sessions as opposed to representing ACS causing presenting acute heart failure exacerbation, particularly in the absence of any recent CP, and with presenting EKG showing no evidence of acute ischemic changes. However, will continue to evaluate with further trending of troponin and close monitoring on tele.  Anuric at baseline.  Case discussed with on-call nephrology, who will consult, do not feel appear to be an indication for urgent overnight hemodialysis at this time , but rather are planning for hemodialysis in the morning.    Plan: monitor strict I's & O's and daily weights. Monitor on telemetry.  Repeat BMP in the morning.  Check mag. Nephrology consulted, with plan for HD in the AM. Trend trop. Close monitoring of ensuing blood pressure to assess for potential of optimization of afterload reduction in order to optimize cardiac output.         #) Acute hypoxic respiratory failure: in the context of no known baseline supplemental oxygen requirements, presenting O2 sat in the high 80s on room air, with ensuing improvement in the high 90s on 4 L nasal cannula.  Appears to be in the basis of acute on chronic diastolic heart failure stemming from multiple missed scheduled hemodialysis sessions over the last few days, as above. no known chronic underlying pulmonary conditions. Presenting CXR shows interstitial/pulmonary edema consistent with acutely decompensated heart failure, showing no evidence of infiltrate, effusion, or pneumothorax.  Evidence of suggest pneumonia, while COVID-19/influenza PCR are negative.  ACS is felt to be less likely at this time in the absence of any recent chest pain and in the and presenting EKG showing no evidence of acute ischemic process. Trending trop in setting of know chronic elevation. Clinically, presentation is less suggestive of acute PE at this time.   Plan: further evaluation and management of presenting acute on chronic diastolic heart failure, as above, including monitoring of continuous pulse oximetry with prn supplemental O2 to maintain O2 sats greater than or equal to 92%. monitor on telemetry.  Nephrology consulted, plan for HD in the morning . check CMP and CBC in the morning. Check serum Mg and Phos levels. Trending trop, as above.        #) Prolonged QTc interval: Presenting EKG reflects QTC of 514, which is slightly shorter than most recent prior of 523.  EKG on 10/28/2021.  Plan: Add on serum magnesium level.  Monitor on telemetry.  Repeat EKG ordered for the morning to further trend degree of QTC prolongation.  Attempt to refrain from medications associated with QTC  prolongation.  would consider prn Ativan as antiemetic choice if ensuing development of  nausea.      #) End-stage renal disease: On hemodialysis Monday, 0 Friday schedule, with patient missing 2 most recent scheduled HD sessions, as further detailed above, resulting in this evening's presentation involving acute volume overload associated with acute on chronic diastolic heart failure.,  Patient does not appear uremic at this time, and presenting labs are not consistent with hyperkalemia or anion gap metabolic acidosis.  On-call nephrology consulted, and are planning for hemodialysis session in the morning, feeling that there is no indication for urgent overnight hemodialysis at this time.  Plan: Nephrology consulted, with plan for HD in the morning.  Counseled patient on importance of improved compliance and attending her scheduled hemodialysis sessions.  Monitor strict I's and O's and daily weights.  Resume Sensipar.  Repeat CMP and serum magnesium level in the morning.  Check serum phosphorus level.      #) Anemia of chronic kidney disease: Associated baseline hemoglobin 9-10, with presenting hemoglobin consistent with this range.  No evidence of active bleed at this time.  Plan: Repeat CBC in the morning.       #) GERD: Documented history of such, on Protonix as an outpatient.  Plan: Continue home PPI.      #) Hyperlipidemia: documented h/o such. On high intensity atorvastatin as outpatient.    Plan: continue home statin.        #) Hypertension: With a secondary contribution from known end-stage renal disease.  Outpatient hypertensive regimen consists of losartan.  Systolic blood pressures in the ED had been normotensive thus far.  Plan: Close monitoring ensuing blood pressure via routine vital signs.  Resume home losartan.  Monitor strict I's and O's and daily weights.  Nephrology consulted, with plan for HD in the morning.     DVT prophylaxis: SCD's   Code  Status: Full code Family Communication: none Disposition Plan: Per Rounding Team Consults called: On-call nephrology consulted, as further detailed above;  Admission status: Observation; PCU  Warrants inpatient status on basis of    PLEASE NOTE THAT DRAGON DICTATION SOFTWARE WAS USED IN THE CONSTRUCTION OF THIS NOTE.   [place] DO [facility]  From [place]   11/07/2021, 10:24 PM

## 2021-11-07 NOTE — ED Notes (Addendum)
Patient's son transported pt home.

## 2021-11-07 NOTE — ED Provider Notes (Signed)
[facility] EMERGENCY DEPARTMENT Provider Note   CSN: [id number] Arrival date & time: 11/07/21  0507     History Chief Complaint  Patient presents with   Shortness of Breath    [name] is a 70 y.o. female.  70 year old female who presents emerged from today secondary to reports shortness of breath.  Patient is difficult to arouse.  I cannot get a history from her.  Her vital signs were within normal limits and she appeared well.  General dyspnea work-up initiated.  I contacted her sister who gave me the patient's son's phone number.  Both his sister and son expressed concern for the patient calling 911 over and over and over again for different reasons.  They state that she is goes to dialysis.  But she often gets anxiety attacks and feels like she is short of breath.  She also usually refuses EMS transport after they check her out and she calms down.  Son states that she takes Ambien against as advised secondary to difficulty sleeping.  Within this makes her groggy throughout the day.  He states the fact that she is sleepy now is probably from the Ambien and she also takes a benzodiazepine.  States that has been trying to get her placed but is not able to get that done yet.  Patient unable to offer much history secondary to being so sleepy.   Shortness of Breath     Past Medical History:  Diagnosis Date   Adenomatous polyp of colon 10/2010, 2006, 2015   Anemia in CKD (chronic kidney disease) 11/07/2012   s/p blood transfusion.    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  Had some early rejection then stable function for years, then had slow decline of function and went back on hemodialysis in  2012.  Gets HD TTS schedule at [facility] on [facility] still using L forearm AVF.      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[place])    left upper arm   Pulmonary edema 12/2019   Stroke ([place]) 11/2015   TIA   Weight loss, unintentional     Patient Active Problem List   Diagnosis Date Noted   Protein-calorie malnutrition, severe 08/12/2021   Diverticulitis of colon with perforation 08/06/2021   Acute respiratory failure with hypoxia ([place]) 08/06/2021   Major depressive disorder, recurrent episode, moderate ([place]) 02/23/2021   TIA (transient ischemic attack) 02/21/2021   Prolonged QT interval 02/21/2021   NSTEMI (non-ST elevated myocardial infarction) ([place]) 05/14/2020   Volume overload 05/13/2020   Hypotension after procedure 03/16/2020   Chest pain 03/16/2020   Change in mental state 03/15/2020   Ventricular tachycardia 03/15/2020   Seizure ([place]) 03/15/2020   Unresponsive episode 03/15/2020   Pulmonary edema 57/84/6962   Complication of vascular access for dialysis 12/21/2019   Breakdown (  mechanical) of surgically created arteriovenous fistula, initial encounter ([place]) 12/18/2019   Weakness 10/11/2019   Aneurysm artery, 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   Dependence on renal dialysis ([place]) 07/24/2019   Iron deficiency anemia, unspecified 06/09/2019   Age-related osteoporosis without current pathological fracture 04/17/2019   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   Coagulation defect, unspecified ([place]) 04/17/2019   Diarrhea, unspecified 04/17/2019   Encounter for screening for depression 04/17/2019   Headache, unspecified 04/17/2019   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   Pain,  unspecified 04/17/2019   Polyp of colon 04/17/2019   Primary generalized (osteo)arthritis 04/17/2019   Pruritus, unspecified 04/17/2019   Pure hypercholesterolemia, unspecified 04/17/2019   Secondary hyperparathyroidism of renal origin ([place]) 04/17/2019   Gastro-esophageal reflux disease without esophagitis 04/17/2019   Essential (primary) hypertension 04/17/2019   Hyperlipidemia, unspecified 04/17/2019   Transient cerebral ischemic attack, unspecified 04/17/2019   Hypoxemia 12/13/2018   Prolonged Q-T interval on ECG 02/24/2017   Hypokalemia 02/24/2017   Acute ischemic stroke ([place]) 02/23/2017   Malnutrition of moderate degree 12/24/2016   Problem with dialysis access ([place]) 12/21/2016   Acute ischemic colitis ([place]) 05/16/2016   Colitis 05/15/2016   Rectal bleeding 05/15/2016   Neurologic abnormality 11/19/2015   Hyperkalemia 11/19/2015   Anxiety 11/19/2015   Insomnia 11/19/2015   Gait instability    Stroke-like symptom 11/16/2015   Vestibular neuritis 11/16/2015   Stroke (cerebrum) ([place]) 11/16/2015   Dizziness 05/09/2015   Ataxia 05/09/2015   H/O: CVA (cerebrovascular accident) 05/09/2015   Left facial numbness 05/09/2015   Left leg numbness 05/09/2015   Hyperlipidemia    Acute on chronic diastolic heart failure ([place])    Shortness of breath 04/01/2013   HTN (hypertension) 04/01/2013   Cholecystitis, acute 11/07/2012   End-stage renal disease on hemodialysis ([place]) 11/07/2012   Anemia in chronic kidney disease 11/07/2012   Dyspnea 12/31/2011    Past Surgical History:  Procedure Laterality Date   AV FISTULA PLACEMENT     for dialysis   AV FISTULA PLACEMENT Left 11/22/2015   Procedure: ARTERIOVENOUS (AV) FISTULA CREATION-LEFT BRACHIOCEPHALIC;  Surgeon: Serafina Mitchell, MD;  Location: [place];  Service: Vascular;  Laterality: Left;   AV FISTULA PLACEMENT Right 03/15/2020   Procedure: INSERTION OF ARTERIOVENOUS (AV) GORE-TEX GRAFT ARM ( BRACHIAL AXILLARY );  Surgeon: Katha Cabal, MD;  Location: [facility];  Service: Vascular;  Laterality: Right;   BACK SURGERY     CERVICAL FUSION     CHOLECYSTECTOMY  12/02/2012   Procedure: LAPAROSCOPIC CHOLECYSTECTOMY WITH INTRAOPERATIVE CHOLANGIOGRAM;  Surgeon: Edward Jolly, MD;  Location: [place] [facility];  Service: General;  Laterality: N/A;   EYE SURGERY Bilateral    cataract surgery   EYE SURGERY Left 2019   laser   HEMATOMA EVACUATION Left 12/24/2016   Procedure: EVACUATION HEMATOMA LEFT UPPER ARM;  Surgeon: Waynetta Sandy, MD;  Location: [place];  Service: Vascular;  Laterality: Left;   I & D EXTREMITY Left 12/31/2016   Procedure: IRRIGATION AND DEBRIDEMENT EXTREMITY;  Surgeon: Angelia Mould, MD;  Location: [place];  Service: Vascular;  Laterality: Left;   INSERTION OF DIALYSIS CATHETER Right 12/24/2016   Procedure: INSERTION OF DIALYSIS CATHETER;  Surgeon: Waynetta Sandy, MD;  Location: [place];  Service: Vascular;  Laterality: Right;   INSERTION OF DIALYSIS CATHETER Right 02/04/2017   Procedure: INSERTION OF DIALYSIS CATHETER;  Surgeon: Waynetta Sandy, MD;  Location: [place];  Service: Vascular;  Laterality: Right;   [place]  PERIPHERAL VASCULAR CATHETERIZATION Left 10/23/2016   Procedure: Fistulagram;  Surgeon: Elam Dutch, MD;  Location: [place] [facility];  Service: Cardiovascular;  Laterality: Left;   PERIPHERAL VASCULAR THROMBECTOMY Right 04/16/2020   Procedure: PERIPHERAL VASCULAR THROMBECTOMY;  Surgeon: Katha Cabal, MD;  Location: [place] [facility];  Service: Cardiovascular;  Laterality: Right;   RESECTION OF ARTERIOVENOUS FISTULA ANEURYSM Left 11/22/2015   Procedure: RESECTION OF LEFT RADIOCEPHALIC FISTULA ANEURYSM ;  Surgeon: Serafina Mitchell, MD;  Location: [place] [facility];  Service: Vascular;  Laterality: Left;   REVISON OF ARTERIOVENOUS FISTULA Left 12/22/2016   Procedure: REVISON OF LEFT ARTERIOVENOUS FISTULA;  Surgeon: Waynetta Sandy, MD;  Location: [place];  Service: Vascular;  Laterality: Left;   REVISON OF ARTERIOVENOUS FISTULA Left 02/04/2017   Procedure: REVISON OF LEFT UPPER ARM ARTERIOVENOUS FISTULA;  Surgeon: Waynetta Sandy, MD;  Location: [place];  Service: Vascular;  Laterality: Left;   UPPER EXTREMITY ANGIOGRAPHY Bilateral 09/19/2019   Procedure: UPPER EXTREMITY ANGIOGRAPHY;  Surgeon: Katha Cabal, MD;  Location: [place] [facility];  Service: Cardiovascular;  Laterality: Bilateral;     OB History   No obstetric history on file.     Family History  Problem Relation Age of Onset   Colon cancer Brother    Cancer Brother    Coronary artery disease Mother 19   Hyperlipidemia Mother    Hypertension Mother    Stroke Maternal Aunt    Esophageal cancer Neg Hx    Stomach cancer Neg Hx    Rectal cancer Neg Hx     Social History   Tobacco Use   Smoking status: Former    Types: Cigarettes    Quit date: 12/31/1991    Years since quitting: 29.8   Smokeless tobacco: Never  Vaping Use   Vaping Use: Never used  Substance Use Topics   Alcohol use: No    Alcohol/week: 0.0 standard drinks   Drug use: No    Home Medications Prior to Admission medications   Medication Sig Start Date End Date Taking? Authorizing Provider  acetaminophen (TYLENOL) 500 MG tablet Take 1 tablet (500 mg total) by mouth every 6 (six) hours as needed. Patient taking differently: Take 1,000 mg by mouth every 6 (six) hours as needed for mild pain. 09/05/18   Law, Bea Graff, PA-C  albuterol (VENTOLIN HFA) 108 (90 Base) MCG/ACT inhaler Inhale 1-2 puffs into the lungs every 6 (six) hours as needed for wheezing or shortness of breath. 10/26/21   Jeanell Sparrow, DO  allopurinol (ZYLOPRIM) 100 MG tablet Take 100 mg by mouth daily as needed (gout).    [provider]  ALPRAZolam Duanne Moron) 0.25 MG tablet Take 1 tablet (0.25 mg total) by mouth daily as needed for anxiety. 08/18/21   Nita Sells, MD  amoxicillin-clavulanate (AUGMENTIN)  500-125 MG tablet Take 1 tablet (500 mg total) by mouth 2 (two) times daily. Patient taking differently: Take 1 tablet by mouth 2 (two) times daily. Start date: 10/17/21 10/17/21   Domenic Moras, PA-C  atorvastatin (LIPITOR) 80 MG tablet TAKE 1 TABLET (80 MG TOTAL) BY MOUTH DAILY. Patient taking differently: Take 80 mg by mouth daily. 02/25/21 02/25/22  Dessa Phi, DO  busPIRone (BUSPAR) 10 MG tablet TAKE 1 TABLET (10 MG TOTAL) BY MOUTH TWO TIMES DAILY. Patient taking differently: Take 10 mg by mouth 2 (two) times daily as needed (anxiety). 08/18/21 08/18/22  Nita Sells, MD  carbamide peroxide (DEBROX) 6.5 % OTIC solution Place 5  drops into the left ear 2 (two) times daily as needed. Patient taking differently: Place 5 drops into the left ear 2 (two) times daily as needed (earwax). 10/26/21   Jeanell Sparrow, DO  cinacalcet (SENSIPAR) 30 MG tablet Take 30 mg by mouth daily. 01/04/20   [provider]  ondansetron (ZOFRAN) 8 MG tablet Take 8 mg by mouth 2 (two) times daily as needed for nausea or vomiting.     [provider]  pantoprazole (PROTONIX) 40 MG tablet Take 1 tablet (40 mg total) by mouth daily. 08/18/21   Nita Sells, MD  zolpidem (AMBIEN) 10 MG tablet Take 10 mg by mouth at bedtime as needed for sleep. 02/13/21   [provider]    Allergies    Sulfa antibiotics and Adhesive [tape]  Review of Systems   Review of Systems  Unable to perform ROS: Mental status change  Respiratory:  Positive for shortness of breath.    Physical Exam Updated Vital Signs BP (!) 125/47   Pulse 97   Temp (!) 97.5 F (36.4 C) (Oral)   Resp 18   SpO2 94%   Physical Exam Vitals and nursing note reviewed.  Constitutional:      Appearance: She is well-developed.  HENT:     Head: Normocephalic and atraumatic.     Mouth/Throat:     Mouth: Mucous membranes are moist.     Pharynx: Oropharynx is clear.  Eyes:     Pupils: Pupils are equal, round, and reactive to  light.  Cardiovascular:     Rate and Rhythm: Normal rate and regular rhythm.  Pulmonary:     Effort: No respiratory distress.     Breath sounds: No stridor. No decreased breath sounds.  Chest:     Chest wall: No mass or tenderness.  Abdominal:     General: There is no distension.     Palpations: Abdomen is soft.  Musculoskeletal:        General: Normal range of motion.     Cervical back: Normal range of motion.  Skin:    General: Skin is warm and dry.  Neurological:     General: No focal deficit present.     Mental Status: She is alert.    ED Results / Procedures / Treatments   Labs (all labs ordered are listed, but only abnormal results are displayed) Labs Reviewed  CBC WITH DIFFERENTIAL/PLATELET  COMPREHENSIVE METABOLIC PANEL  BRAIN NATRIURETIC PEPTIDE  AMMONIA  TROPONIN I (HIGH SENSITIVITY)    EKG None  Radiology DG Chest 2 View  Result Date: 11/07/2021 CLINICAL DATA:  70 year old female with history of shortness of breath and weakness. EXAM: CHEST - 2 VIEW COMPARISON:  Chest x-ray 10/27/2021. FINDINGS: There is cephalization of the pulmonary vasculature and slight indistinctness of the interstitial markings suggestive of mild pulmonary edema. Trace bilateral pleural effusions. No pneumothorax. Heart size is mildly enlarged. Upper mediastinal contours are remarkable for scratch the upper mediastinal contours are within normal limits. Atherosclerotic calcifications in the thoracic aorta. IMPRESSION: 1. Cardiomegaly with evidence of mild interstitial pulmonary edema and trace bilateral pleural effusions; imaging findings concerning for mild congestive heart failure. 2. Aortic atherosclerosis. Electronically Signed   By: Vinnie Langton M.D.   On: 11/07/2021 05:47    Procedures Procedures   Medications Ordered in ED Medications  albuterol (VENTOLIN HFA) 108 (90 Base) MCG/ACT inhaler 2 puff (has no administration in time range)    ED Course  I have reviewed the  triage vital  signs and the nursing notes.  Pertinent labs & imaging results that were available during my care of the patient were reviewed by me and considered in my medical decision making (see chart for details).    MDM Rules/Calculators/A&P                         After speaking with the sister and son I think the patient still sleepy because of Ambien and other medications.  Her respiratory status is pretty reassuring.  Her work-up so far is negative.  Patient can likely be discharged home.  Transition of care team consulted for help with the patient placement.  Care transferred to oncoming provider pending rest of work-up the patient is to metabolize her Ambien and be discharged to the son's care.   Final Clinical Impression(s) / ED Diagnoses Final diagnoses:  None    Rx / DC Orders ED Discharge Orders     None        Marquita Lias, Corene Cornea, MD 11/09/21 0140

## 2021-11-07 NOTE — ED Triage Notes (Signed)
Per EMS pt has been having trouble breathing since early this am EMS was called to her house around 4 am with no transport, condition worsened throughout the day

## 2021-11-07 NOTE — ED Notes (Signed)
Provider at bedside

## 2021-11-07 NOTE — ED Provider Notes (Signed)
[facility] EMERGENCY DEPARTMENT Provider Note   CSN: [id number] Arrival date & time: 11/07/21  2012     History Chief Complaint  Patient presents with   Shortness of Breath    [name] is a 70 y.o. female.   Shortness of Breath Severity:  Severe Onset quality:  Gradual Duration:  1 day Timing:  Constant Progression:  Worsening Context comment:  Missed dialysis Relieved by:  Nothing Worsened by:  Nothing Ineffective treatments:  None tried Associated symptoms: no abdominal pain, no chest pain, no cough, no ear pain, no fever, no rash, no sore throat and no vomiting       Past Medical History:  Diagnosis Date   Adenomatous polyp of colon 10/2010, 2006, 2015   Anemia in CKD (chronic kidney disease) 11/07/2012   s/p blood transfusion.    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  Had some early rejection then stable function for years, then had slow decline of function and went back on hemodialysis in 2012.  Gets HD TTS schedule at [facility] on [facility] still using L forearm AVF.      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[place])    left upper arm   Pulmonary edema 12/2019   Stroke ([place]) 11/2015   TIA   Weight loss, unintentional     Patient Active Problem List   Diagnosis Date Noted   GERD (gastroesophageal reflux disease)    Protein-calorie malnutrition, severe 08/12/2021   Diverticulitis of colon  with perforation 08/06/2021   Acute respiratory failure with hypoxia ([place]) 08/06/2021   Major depressive disorder, recurrent episode, moderate ([place]) 02/23/2021   TIA (transient ischemic attack) 02/21/2021   Prolonged QT interval 02/21/2021   NSTEMI (non-ST elevated myocardial infarction) ([place]) 05/14/2020   Volume overload 05/13/2020   Hypotension after procedure 03/16/2020   Chest pain 03/16/2020   Change in mental state 03/15/2020   Ventricular tachycardia 03/15/2020   Seizure ([place]) 03/15/2020   Unresponsive episode 03/15/2020   Pulmonary edema 08/65/7846   Complication of vascular access for dialysis 12/21/2019   Breakdown (mechanical) of surgically created arteriovenous fistula, initial encounter ([place]) 12/18/2019   Weakness 10/11/2019   Aneurysm artery, 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   Dependence on renal dialysis ([place]) 07/24/2019   Iron deficiency anemia, unspecified 06/09/2019   Age-related osteoporosis without current pathological fracture 04/17/2019   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   Coagulation defect, unspecified ([place]) 04/17/2019   Diarrhea, unspecified 04/17/2019   Encounter for screening for depression 04/17/2019   Headache, unspecified 04/17/2019   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   Pain, unspecified 04/17/2019   Polyp of colon 04/17/2019   Primary generalized (osteo)arthritis 04/17/2019   Pruritus, unspecified 04/17/2019   Pure hypercholesterolemia, unspecified 04/17/2019   Secondary hyperparathyroidism of renal origin ([place]) 04/17/2019   Gastro-esophageal reflux disease  without esophagitis 04/17/2019   Essential (primary) hypertension 04/17/2019   Hyperlipidemia, unspecified 04/17/2019   Transient cerebral ischemic attack, unspecified 04/17/2019   Hypoxemia 12/13/2018   Prolonged Q-T interval on ECG 02/24/2017   Hypokalemia 02/24/2017   Acute ischemic stroke ([place]) 02/23/2017   Malnutrition of moderate degree 12/24/2016    Problem with dialysis access ([place]) 12/21/2016   Acute ischemic colitis ([place]) 05/16/2016   Colitis 05/15/2016   Rectal bleeding 05/15/2016   Neurologic abnormality 11/19/2015   Hyperkalemia 11/19/2015   Anxiety 11/19/2015   Insomnia 11/19/2015   Gait instability    Stroke-like symptom 11/16/2015   Vestibular neuritis 11/16/2015   Stroke (cerebrum) ([place]) 11/16/2015   Dizziness 05/09/2015   Ataxia 05/09/2015   H/O: CVA (cerebrovascular accident) 05/09/2015   Left facial numbness 05/09/2015   Left leg numbness 05/09/2015   Hyperlipidemia    Acute on chronic diastolic CHF (congestive heart failure) ([place])    SOB (shortness of breath) 04/01/2013   HTN (hypertension) 04/01/2013   Cholecystitis, acute 11/07/2012   End-stage renal disease on hemodialysis ([place]) 11/07/2012   Anemia in chronic kidney disease 11/07/2012   Dyspnea 12/31/2011    Past Surgical History:  Procedure Laterality Date   AV FISTULA PLACEMENT     for dialysis   AV FISTULA PLACEMENT Left 11/22/2015   Procedure: ARTERIOVENOUS (AV) FISTULA CREATION-LEFT BRACHIOCEPHALIC;  Surgeon: Serafina Mitchell, MD;  Location: [place];  Service: Vascular;  Laterality: Left;   AV FISTULA PLACEMENT Right 03/15/2020   Procedure: INSERTION OF ARTERIOVENOUS (AV) GORE-TEX GRAFT ARM ( BRACHIAL AXILLARY );  Surgeon: Katha Cabal, MD;  Location: [facility];  Service: Vascular;  Laterality: Right;   BACK SURGERY     CERVICAL FUSION     CHOLECYSTECTOMY  12/02/2012   Procedure: LAPAROSCOPIC CHOLECYSTECTOMY WITH INTRAOPERATIVE CHOLANGIOGRAM;  Surgeon: Edward Jolly, MD;  Location: [place];  Service: General;  Laterality: N/A;   EYE SURGERY Bilateral    cataract surgery   EYE SURGERY Left 2019   laser   HEMATOMA EVACUATION Left 12/24/2016   Procedure: EVACUATION HEMATOMA LEFT UPPER ARM;  Surgeon: Waynetta Sandy, MD;  Location: [place];  Service: Vascular;  Laterality: Left;   I & D EXTREMITY Left 12/31/2016   Procedure: IRRIGATION AND  DEBRIDEMENT EXTREMITY;  Surgeon: Angelia Mould, MD;  Location: [place];  Service: Vascular;  Laterality: Left;   INSERTION OF DIALYSIS CATHETER Right 12/24/2016   Procedure: INSERTION OF DIALYSIS CATHETER;  Surgeon: Waynetta Sandy, MD;  Location: [place];  Service: Vascular;  Laterality: Right;   INSERTION OF DIALYSIS CATHETER Right 02/04/2017   Procedure: INSERTION OF DIALYSIS CATHETER;  Surgeon: Waynetta Sandy, MD;  Location: [facility];  Service: Vascular;  Laterality: Right;   KIDNEY TRANSPLANT  1996   PERIPHERAL VASCULAR CATHETERIZATION Left 10/23/2016   Procedure: Fistulagram;  Surgeon: Elam Dutch, MD;  Location: [place] CV LAB;  Service: Cardiovascular;  Laterality: Left;   PERIPHERAL VASCULAR THROMBECTOMY Right 04/16/2020   Procedure: PERIPHERAL VASCULAR THROMBECTOMY;  Surgeon: Katha Cabal, MD;  Location: [place] CV LAB;  Service: Cardiovascular;  Laterality: Right;   RESECTION OF ARTERIOVENOUS FISTULA ANEURYSM Left 11/22/2015   Procedure: RESECTION OF LEFT RADIOCEPHALIC FISTULA ANEURYSM ;  Surgeon: Serafina Mitchell, MD;  Location: [place];  Service: Vascular;  Laterality: Left;   REVISON OF ARTERIOVENOUS FISTULA Left 12/22/2016   Procedure: REVISON OF LEFT ARTERIOVENOUS FISTULA;  Surgeon: Waynetta Sandy, MD;  Location: [place];  Service: Vascular;  Laterality: Left;   REVISON OF ARTERIOVENOUS FISTULA Left 02/04/2017   Procedure: REVISON OF LEFT UPPER ARM ARTERIOVENOUS FISTULA;  Surgeon: Waynetta Sandy, MD;  Location: [place];  Service: Vascular;  Laterality: Left;   UPPER EXTREMITY ANGIOGRAPHY Bilateral 09/19/2019   Procedure: UPPER EXTREMITY ANGIOGRAPHY;  Surgeon: Katha Cabal, MD;  Location: [place] CV LAB;  Service: Cardiovascular;  Laterality: Bilateral;     OB History   No obstetric history on file.     Family History  Problem Relation Age of Onset   Colon cancer Brother    Cancer Brother    Coronary artery disease  Mother 73   Hyperlipidemia Mother    Hypertension Mother    Stroke Maternal Aunt    Esophageal cancer Neg Hx    Stomach cancer Neg Hx    Rectal cancer Neg Hx     Social History   Tobacco Use   Smoking status: Former    Types: Cigarettes    Quit date: 12/31/1991    Years since quitting: 29.8   Smokeless tobacco: Never  Vaping Use   Vaping Use: Never used  Substance Use Topics   Alcohol use: No    Alcohol/week: 0.0 standard drinks   Drug use: No    Home Medications Prior to Admission medications   Medication Sig Start Date End Date Taking? Authorizing Provider  acetaminophen (TYLENOL) 500 MG tablet Take 1 tablet (500 mg total) by mouth every 6 (six) hours as needed. Patient taking differently: Take 1,000 mg by mouth every 6 (six) hours as needed for mild pain. 09/05/18   Law, Bea Graff, PA-C  albuterol (VENTOLIN HFA) 108 (90 Base) MCG/ACT inhaler Inhale 1-2 puffs into the lungs every 6 (six) hours as needed for wheezing or shortness of breath. 10/26/21   Jeanell Sparrow, DO  allopurinol (ZYLOPRIM) 100 MG tablet Take 100 mg by mouth daily as needed (gout).    [provider]  allopurinol (ZYLOPRIM) 300 MG tablet Take by mouth. 11/03/21   [provider]  ALPRAZolam Duanne Moron) 0.25 MG tablet Take 1 tablet (0.25 mg total) by mouth daily as needed for anxiety. 08/18/21   Nita Sells, MD  amoxicillin-clavulanate (AUGMENTIN) 500-125 MG tablet Take 1 tablet (500 mg total) by mouth 2 (two) times daily. Patient taking differently: Take 1 tablet by mouth 2 (two) times daily. Start date: 10/17/21 10/17/21   Domenic Moras, PA-C  atorvastatin (LIPITOR) 80 MG tablet TAKE 1 TABLET (80 MG TOTAL) BY MOUTH DAILY. Patient taking differently: Take 80 mg by mouth daily. 02/25/21 02/25/22  Dessa Phi, DO  busPIRone (BUSPAR) 10 MG tablet TAKE 1 TABLET (10 MG TOTAL) BY MOUTH TWO TIMES DAILY. Patient taking differently: Take 10 mg by mouth 2 (two) times daily as needed (anxiety).  08/18/21 08/18/22  Nita Sells, MD  carbamide peroxide (DEBROX) 6.5 % OTIC solution Place 5 drops into the left ear 2 (two) times daily as needed. Patient taking differently: Place 5 drops into the left ear 2 (two) times daily as needed (earwax). 10/26/21   Jeanell Sparrow, DO  cinacalcet (SENSIPAR) 30 MG tablet Take 30 mg by mouth daily. 01/04/20   [provider]  losartan (COZAAR) 50 MG tablet Take 50 mg by mouth daily. 11/03/21   [provider]  ondansetron (ZOFRAN) 8 MG tablet Take 8 mg by mouth 2 (two) times daily as needed for nausea or vomiting.     [provider]  pantoprazole (PROTONIX) 40 MG tablet Take 1 tablet (  40 mg total) by mouth daily. 08/18/21   Nita Sells, MD  zolpidem (AMBIEN) 10 MG tablet Take 10 mg by mouth at bedtime as needed for sleep. 02/13/21   [provider]    Allergies    Sulfa antibiotics and Adhesive [tape]  Review of Systems   Review of Systems  Constitutional:  Positive for fatigue. Negative for chills and fever.  HENT:  Negative for ear pain and sore throat.   Eyes:  Negative for pain and visual disturbance.  Respiratory:  Positive for shortness of breath. Negative for cough.   Cardiovascular:  Negative for chest pain and palpitations.  Gastrointestinal:  Negative for abdominal pain and vomiting.  Genitourinary:  Negative for dysuria and hematuria.  Musculoskeletal:  Negative for arthralgias and back pain.  Skin:  Negative for color change and rash.  Neurological:  Negative for seizures and syncope.  All other systems reviewed and are negative.  Physical Exam Updated Vital Signs BP (!) 109/26   Pulse (!) 103   Resp (!) 24   Ht 5\' 2"  (1.575 m)   Wt 54.4 kg   SpO2 100%   BMI 21.95 kg/m   Physical Exam Vitals and nursing note reviewed.  Constitutional:      General: She is not in acute distress.    Appearance: Normal appearance. She is well-developed.  HENT:     Head: Normocephalic and  atraumatic.     Right Ear: External ear normal.     Left Ear: External ear normal.     Nose: Nose normal. No congestion or rhinorrhea.     Mouth/Throat:     Mouth: Mucous membranes are moist.  Eyes:     Extraocular Movements: Extraocular movements intact.     Conjunctiva/sclera: Conjunctivae normal.     Pupils: Pupils are equal, round, and reactive to light.  Cardiovascular:     Rate and Rhythm: Regular rhythm. Tachycardia present.     Pulses: Normal pulses.     Heart sounds: No murmur heard. Pulmonary:     Effort: Pulmonary effort is normal. No respiratory distress.     Breath sounds: Rales present. No wheezing or rhonchi.  Abdominal:     General: Abdomen is flat. Bowel sounds are normal.     Palpations: Abdomen is soft.     Tenderness: There is no abdominal tenderness. There is no guarding or rebound.  Musculoskeletal:        General: No swelling, tenderness or deformity.     Cervical back: Normal range of motion and neck supple. No rigidity.  Skin:    General: Skin is warm and dry.     Capillary Refill: Capillary refill takes less than 2 seconds.  Neurological:     General: No focal deficit present.     Mental Status: She is alert and oriented to person, place, and time.  Psychiatric:        Mood and Affect: Mood normal.    ED Results / Procedures / Treatments   Labs (all labs ordered are listed, but only abnormal results are displayed) Labs Reviewed  COMPREHENSIVE METABOLIC PANEL - Abnormal; Notable for the following components:      Result Value   Chloride 96 (*)    Glucose, Bld 111 (*)    BUN 50 (*)    Creatinine, Ser 5.97 (*)    Calcium 7.7 (*)    Albumin 2.7 (*)    Alkaline Phosphatase 195 (*)    GFR, Estimated 7 (*)  Anion gap 18 (*)    All other components within normal limits  CBC WITH DIFFERENTIAL/PLATELET - Abnormal; Notable for the following components:   RBC 3.25 (*)    Hemoglobin 9.7 (*)    HCT 31.4 (*)    RDW 17.7 (*)    All other components  within normal limits  TROPONIN I (HIGH SENSITIVITY) - Abnormal; Notable for the following components:   Troponin I (High Sensitivity) 33 (*)    All other components within normal limits  TROPONIN I (HIGH SENSITIVITY) - Abnormal; Notable for the following components:   Troponin I (High Sensitivity) 28 (*)    All other components within normal limits  RESP PANEL BY RT-PCR (FLU A&B, COVID) ARPGX2  MAGNESIUM  BRAIN NATRIURETIC PEPTIDE  PHOSPHORUS  MAGNESIUM  COMPREHENSIVE METABOLIC PANEL  CBC    EKG EKG Interpretation  Date/Time:  Friday November 07 2021 20:18:11 EST Ventricular Rate:  102 PR Interval:  181 QRS Duration: 90 QT Interval:  394 QTC Calculation: 514 R Axis:   96 Text Interpretation: Sinus tachycardia Right axis deviation Low voltage, precordial leads Nonspecific T abnrm, anterolateral leads Prolonged QT interval Confirmed by Ripley Fraise [phone]) on 11/07/2021 11:35:57 PM  Radiology DG Chest 2 View  Result Date: 11/07/2021 CLINICAL DATA:  70 year old female with history of shortness of breath and weakness. EXAM: CHEST - 2 VIEW COMPARISON:  Chest x-ray 10/27/2021. FINDINGS: There is cephalization of the pulmonary vasculature and slight indistinctness of the interstitial markings suggestive of mild pulmonary edema. Trace bilateral pleural effusions. No pneumothorax. Heart size is mildly enlarged. Upper mediastinal contours are remarkable for scratch the upper mediastinal contours are within normal limits. Atherosclerotic calcifications in the thoracic aorta. IMPRESSION: 1. Cardiomegaly with evidence of mild interstitial pulmonary edema and trace bilateral pleural effusions; imaging findings concerning for mild congestive heart failure. 2. Aortic atherosclerosis. Electronically Signed   By: Vinnie Langton M.D.   On: 11/07/2021 05:47   CT Head Wo Contrast  Result Date: 11/07/2021 CLINICAL DATA:  Mental status change EXAM: CT HEAD WITHOUT CONTRAST TECHNIQUE: Contiguous  axial images were obtained from the base of the skull through the vertex without intravenous contrast. COMPARISON:  CT head 02/21/2021 FINDINGS: Brain: Moderate to advanced cortical atrophy most prominent in the frontal and temporal lobes. Mild ventricular enlargement consistent with atrophy. Moderate white matter hypodensity bilaterally. Negative for acute infarct, hemorrhage, mass Vascular: Atherosclerotic calcification. Negative for hyperdense vessel Skull: No focal skull lesion. Degenerative changes C1-2 with mild spinal stenosis Sinuses/Orbits: Mild mucosal edema right maxillary sinus. Remaining sinuses clear. Bilateral cataract extraction. Other: None IMPRESSION: Atrophy and chronic microvascular ischemic change. No acute abnormality. Electronically Signed   By: Franchot Gallo M.D.   On: 11/07/2021 08:26   DG Chest Portable 1 View  Result Date: 11/07/2021 CLINICAL DATA:  Initial evaluation for volume overload, missed dialysis. EXAM: PORTABLE CHEST 1 VIEW COMPARISON:  Prior radiograph from earlier the same day. FINDINGS: Marked cardiomegaly again seen. Mediastinal silhouette stable. Extensive aortic atherosclerosis. Lungs mildly hypoinflated. Persistent diffuse pulmonary interstitial edema, relatively similar as compared to prior radiograph performed earlier the same day. No visible pleural effusion on this single AP view of the chest. No new consolidative airspace disease. No pneumothorax. No new osseous abnormality. Remotely healed left-sided rib fracture noted. IMPRESSION: 1. Cardiomegaly with diffuse pulmonary interstitial edema, relatively similar as compared to prior radiograph performed earlier the same day. 2.  Aortic Atherosclerosis (ICD10-I70.0). Electronically Signed   By: Jeannine Boga M.D.   On: 11/07/2021 21:37  Procedures Procedures   Medications Ordered in ED Medications  acetaminophen (TYLENOL) tablet 650 mg (has no administration in time range)    Or  acetaminophen  (TYLENOL) suppository 650 mg (has no administration in time range)  melatonin tablet 3 mg (has no administration in time range)  ALPRAZolam (XANAX) tablet 0.25 mg (has no administration in time range)  atorvastatin (LIPITOR) tablet 80 mg (80 mg Oral Patient Refused/Not Given 11/07/21 2258)  cinacalcet (SENSIPAR) tablet 30 mg (has no administration in time range)  losartan (COZAAR) tablet 50 mg (has no administration in time range)  pantoprazole (PROTONIX) EC tablet 40 mg (has no administration in time range)    ED Course  I have reviewed the triage vital signs and the nursing notes.  Pertinent labs & imaging results that were available during my care of the patient were reviewed by me and considered in my medical decision making (see chart for details).    MDM Rules/Calculators/A&P                          70 year old female with history of end-stage renal disease on hemodialysis every Monday Wednesday Friday presents with progressive dyspnea in the setting of missed dialysis.  She is afebrile on arrival and hemodynamically stable.  She is requiring 4 L of O2.  Exam as above.  Clinically appears volume overloaded.  EKG showed no peaked T waves.  Chest x-ray showed cardiomegaly and diffuse interstitial edema.  Initial troponin 33, delta pending.  COVID and flu negative.  Potassium 4.6.  Patient reassessed.  Unable to wean O2.  Remains mildly tachypneic.  Discussed with renal team.  Agree with admission for dialysis in house.  Medicine team contacted.  Handoff given.  Final Clinical Impression(s) / ED Diagnoses Final diagnoses:  ESRD (end stage renal disease) ([place])  Hypoxia    Rx / DC Orders ED Discharge Orders     None        Idamae Lusher, MD 11/08/21 1304    Luna Fuse, MD 11/12/21 1429

## 2021-11-08 DIAGNOSIS — R0902 Hypoxemia: Secondary | ICD-10-CM | POA: Diagnosis present

## 2021-11-08 DIAGNOSIS — M898X9 Other specified disorders of bone, unspecified site: Secondary | ICD-10-CM | POA: Diagnosis present

## 2021-11-08 DIAGNOSIS — N2581 Secondary hyperparathyroidism of renal origin: Secondary | ICD-10-CM | POA: Diagnosis present

## 2021-11-08 DIAGNOSIS — Z79899 Other long term (current) drug therapy: Secondary | ICD-10-CM | POA: Diagnosis not present

## 2021-11-08 DIAGNOSIS — Z20822 Contact with and (suspected) exposure to covid-19: Secondary | ICD-10-CM | POA: Diagnosis present

## 2021-11-08 DIAGNOSIS — E785 Hyperlipidemia, unspecified: Secondary | ICD-10-CM | POA: Diagnosis present

## 2021-11-08 DIAGNOSIS — D631 Anemia in chronic kidney disease: Secondary | ICD-10-CM | POA: Diagnosis present

## 2021-11-08 DIAGNOSIS — Z87891 Personal history of nicotine dependence: Secondary | ICD-10-CM | POA: Diagnosis not present

## 2021-11-08 DIAGNOSIS — Z981 Arthrodesis status: Secondary | ICD-10-CM | POA: Diagnosis not present

## 2021-11-08 DIAGNOSIS — N186 End stage renal disease: Secondary | ICD-10-CM | POA: Diagnosis present

## 2021-11-08 DIAGNOSIS — K219 Gastro-esophageal reflux disease without esophagitis: Secondary | ICD-10-CM | POA: Diagnosis present

## 2021-11-08 DIAGNOSIS — Z86010 Personal history of colonic polyps: Secondary | ICD-10-CM | POA: Diagnosis not present

## 2021-11-08 DIAGNOSIS — L89151 Pressure ulcer of sacral region, stage 1: Secondary | ICD-10-CM | POA: Diagnosis present

## 2021-11-08 DIAGNOSIS — T8612 Kidney transplant failure: Secondary | ICD-10-CM | POA: Diagnosis present

## 2021-11-08 DIAGNOSIS — R54 Age-related physical debility: Secondary | ICD-10-CM | POA: Diagnosis present

## 2021-11-08 DIAGNOSIS — I5033 Acute on chronic diastolic (congestive) heart failure: Secondary | ICD-10-CM | POA: Diagnosis present

## 2021-11-08 DIAGNOSIS — I132 Hypertensive heart and chronic kidney disease with heart failure and with stage 5 chronic kidney disease, or end stage renal disease: Secondary | ICD-10-CM | POA: Diagnosis present

## 2021-11-08 DIAGNOSIS — Z681 Body mass index (BMI) 19 or less, adult: Secondary | ICD-10-CM | POA: Diagnosis not present

## 2021-11-08 DIAGNOSIS — J9601 Acute respiratory failure with hypoxia: Secondary | ICD-10-CM | POA: Diagnosis present

## 2021-11-08 DIAGNOSIS — Z8673 Personal history of transient ischemic attack (TIA), and cerebral infarction without residual deficits: Secondary | ICD-10-CM | POA: Diagnosis not present

## 2021-11-08 DIAGNOSIS — F419 Anxiety disorder, unspecified: Secondary | ICD-10-CM | POA: Diagnosis present

## 2021-11-08 DIAGNOSIS — R34 Anuria and oliguria: Secondary | ICD-10-CM | POA: Diagnosis present

## 2021-11-08 DIAGNOSIS — I959 Hypotension, unspecified: Secondary | ICD-10-CM | POA: Diagnosis present

## 2021-11-08 DIAGNOSIS — Z992 Dependence on renal dialysis: Secondary | ICD-10-CM | POA: Diagnosis not present

## 2021-11-08 DIAGNOSIS — R64 Cachexia: Secondary | ICD-10-CM | POA: Diagnosis present

## 2021-11-08 DIAGNOSIS — Y830 Surgical operation with transplant of whole organ as the cause of abnormal reaction of the patient, or of later complication, without mention of misadventure at the time of the procedure: Secondary | ICD-10-CM | POA: Diagnosis present

## 2021-11-08 LAB — CBC
HCT: 32 % — ABNORMAL LOW (ref 36.0–46.0)
Hemoglobin: 10.1 g/dL — ABNORMAL LOW (ref 12.0–15.0)
MCH: 30.2 pg (ref 26.0–34.0)
MCHC: 31.6 g/dL (ref 30.0–36.0)
MCV: 95.8 fL (ref 80.0–100.0)
Platelets: 275 10*3/uL (ref 150–400)
RBC: 3.34 MIL/uL — ABNORMAL LOW (ref 3.87–5.11)
RDW: 17.4 % — ABNORMAL HIGH (ref 11.5–15.5)
WBC: 6.4 10*3/uL (ref 4.0–10.5)
nRBC: 0 % (ref 0.0–0.2)

## 2021-11-08 LAB — COMPREHENSIVE METABOLIC PANEL
ALT: 15 U/L (ref 0–44)
AST: 32 U/L (ref 15–41)
Albumin: 2.7 g/dL — ABNORMAL LOW (ref 3.5–5.0)
Alkaline Phosphatase: 196 U/L — ABNORMAL HIGH (ref 38–126)
Anion gap: 15 (ref 5–15)
BUN: 53 mg/dL — ABNORMAL HIGH (ref 8–23)
CO2: 26 mmol/L (ref 22–32)
Calcium: 7.6 mg/dL — ABNORMAL LOW (ref 8.9–10.3)
Chloride: 97 mmol/L — ABNORMAL LOW (ref 98–111)
Creatinine, Ser: 6.14 mg/dL — ABNORMAL HIGH (ref 0.44–1.00)
GFR, Estimated: 7 mL/min — ABNORMAL LOW (ref >60)
Glucose, Bld: 106 mg/dL — ABNORMAL HIGH (ref 70–99)
Potassium: 4.4 mmol/L (ref 3.5–5.1)
Sodium: 138 mmol/L (ref 135–145)
Total Bilirubin: 0.8 mg/dL (ref 0.3–1.2)
Total Protein: 6.9 g/dL (ref 6.5–8.1)

## 2021-11-08 LAB — PHOSPHORUS: Phosphorus: 3.9 mg/dL (ref 2.5–4.6)

## 2021-11-08 LAB — TROPONIN I (HIGH SENSITIVITY): Troponin I (High Sensitivity): 32 ng/L — ABNORMAL HIGH (ref <18)

## 2021-11-08 LAB — MAGNESIUM: Magnesium: 2.2 mg/dL (ref 1.7–2.4)

## 2021-11-08 LAB — BRAIN NATRIURETIC PEPTIDE: B Natriuretic Peptide: >4500 pg/mL — ABNORMAL HIGH (ref 0.0–100.0)

## 2021-11-08 MED ORDER — CHLORHEXIDINE GLUCONATE CLOTH 2 % EX PADS
6.0000 | MEDICATED_PAD | Freq: Every day | CUTANEOUS | Status: DC
  Administered 2021-11-08 – 2021-11-11 (×4): 6 via TOPICAL

## 2021-11-08 MED ORDER — CINACALCET HCL 30 MG PO TABS
30.0000 mg | ORAL_TABLET | ORAL | Status: DC
  Administered 2021-11-08 – 2021-11-10 (×2): 30 mg via ORAL
  Filled 2021-11-08 (×2): qty 1

## 2021-11-08 MED ORDER — PROSOURCE PLUS PO LIQD
30.0000 mL | Freq: Two times a day (BID) | ORAL | Status: DC
  Administered 2021-11-08 – 2021-11-11 (×7): 30 mL via ORAL
  Filled 2021-11-08 (×8): qty 30

## 2021-11-08 MED ORDER — METOCLOPRAMIDE HCL 5 MG/ML IJ SOLN
5.0000 mg | Freq: Four times a day (QID) | INTRAMUSCULAR | Status: DC | PRN
  Administered 2021-11-08: 5 mg via INTRAVENOUS
  Filled 2021-11-08: qty 2

## 2021-11-08 MED ORDER — METOCLOPRAMIDE HCL 5 MG/ML IJ SOLN
5.0000 mg | Freq: Three times a day (TID) | INTRAMUSCULAR | Status: DC
Start: 2021-11-08 — End: 2021-11-09
  Administered 2021-11-08 – 2021-11-09 (×2): 5 mg via INTRAVENOUS
  Filled 2021-11-08 (×2): qty 2

## 2021-11-08 NOTE — Progress Notes (Signed)
PROGRESS NOTE    [name]  ZWC:[id number] DOB: 19-Feb-1951 DOA: 11/07/2021 PCP: [name], MD   Brief Narrative:  [name] is a 70 y.o. female with medical history significant for end-stage renal disease on hemodialysis on Monday, Wednesday, Friday schedule, anemia of chronic kidney disease, chronic diastolic heart failure, hypertension, hyperlipidemia, chronically elevated troponin, who is admitted to [facility] on 11/07/2021 with acute on chronic diastolic heart failure after presenting from home to [facility] ED complaining of shortness of breath.   Assessment & Plan:   Principal Problem:   Acute on chronic diastolic CHF (congestive heart failure) (HCC) Active Problems:   End-stage renal disease on hemodialysis (HCC)   SOB (shortness of breath)   HTN (hypertension)   Hyperlipidemia   Prolonged Q-T interval on ECG   Volume overload   Acute respiratory failure with hypoxia (HCC)   GERD (gastroesophageal reflux disease)  Acute hypoxic respiratory failure secondary to acute on chronic congestive heart failure with preserved ejection fraction/ESRD on HD: Per chart, patient has missed 2 sessions of dialysis but she cannot confirm that.  She appears better and feels better than yesterday.  Nephrology is consulted and the treatment will be dialysis which we will defer to nephrology.  She is only on 2 L and I hope we will be able to wean her off after dialysis.  History of prolonged QTC: QTC slightly shorter than her recent known QTC.  Continue to avoid QT prolonging medications and keep magnesium over 2 and potassium over 4.  Anemia of chronic disease: Stable.  GERD: Continue PPI.  Hyperlipidemia: Continue statin.  Essential hypertension: Blood pressure low here.  She is on losartan.  We will discontinue that and monitor.  She is asymptomatic.  Chronic anxiety: Continue as needed Xanax that she takes at home.  DVT prophylaxis: SCDs Start: 11/07/21 2223   Code  Status: Full Code  Family Communication:  None present at bedside.    Status is: Observation  The patient will require care spanning > 2 midnights and should be moved to inpatient because: Needs management of acute on chronic diastolic congestive heart failure.  Estimated body mass index is 20.85 kg/m as calculated from the following:   Height as of this encounter: 5\' 2"  (1.575 m).   Weight as of this encounter: 51.7 kg.  Pressure Injury 10/27/21 Coccyx Mid Stage 1 -  Intact skin with non-blanchable redness of a localized area usually over a bony prominence. (Active)  10/27/21 2200  Location: Coccyx  Location Orientation: Mid  Staging: Stage 1 -  Intact skin with non-blanchable redness of a localized area usually over a bony prominence.  Wound Description (Comments):   Present on Admission: Yes    Nutritional Assessment: Body mass index is 20.85 kg/m.Marland Kitchen Seen by dietician.  I agree with the assessment and plan as outlined below: Nutrition Status:        .  Skin Assessment: I have examined the patient's skin and I agree with the wound assessment as performed by the wound care RN as outlined below: Pressure Injury 10/27/21 Coccyx Mid Stage 1 -  Intact skin with non-blanchable redness of a localized area usually over a bony prominence. (Active)  10/27/21 2200  Location: Coccyx  Location Orientation: Mid  Staging: Stage 1 -  Intact skin with non-blanchable redness of a localized area usually over a bony prominence.  Wound Description (Comments):   Present on Admission: Yes    Consultants:  Nephrology  Procedures:  None  Antimicrobials:  Anti-infectives (From admission, onward)    None          Subjective: Patient seen and examined.  She was comfortable without any shortness of breath.  Alert and oriented.  Objective: Vitals:   11/08/21 0258 11/08/21 0313 11/08/21 0728 11/08/21 1126  BP: (!) 90/40 (!) 97/32 (!) 115/45 (!) 100/39  Pulse: 85  85 93  Resp: 14  15 14 16   Temp: 97.7 F (36.5 C)  97.9 F (36.6 C) 97.9 F (36.6 C)  TempSrc: Oral  Oral Oral  SpO2: 100%  100% 97%  Weight: 51.7 kg     Height:        Intake/Output Summary (Last 24 hours) at 11/08/2021 1210 Last data filed at 11/08/2021 1012 Gross per 24 hour  Intake 480 ml  Output --  Net 480 ml   Filed Weights   11/07/21 2018 11/08/21 0034 11/08/21 0258  Weight: 54.4 kg 51.7 kg 51.7 kg    Examination:  General exam: Appears calm and comfortable, appears older than stated age and cachectic. Respiratory system: Diminished breath sounds bilaterally. Respiratory effort normal. Cardiovascular system: S1 & S2 heard, RRR. No JVD, murmurs, rubs, gallops or clicks. No pedal edema. Gastrointestinal system: Abdomen is nondistended, soft and nontender. No organomegaly or masses felt. Normal bowel sounds heard. Central nervous system: Alert and oriented. No focal neurological deficits. Extremities: Symmetric 5 x 5 power. Skin: No rashes, lesions or ulcers   Data Reviewed: I have personally reviewed following labs and imaging studies  CBC: Recent Labs  Lab 11/07/21 0852 11/07/21 0927 11/07/21 2033 11/08/21 0042  WBC 4.9  --  6.6 6.4  NEUTROABS 2.8  --  4.4  --   HGB 9.8* 10.9* 9.7* 10.1*  HCT 31.4* 32.0* 31.4* 32.0*  MCV 96.9  --  96.6 95.8  PLT 266  --  280 620   Basic Metabolic Panel: Recent Labs  Lab 11/07/21 0927 11/07/21 2033 11/07/21 2234 11/08/21 0042  NA 138 139  --  138  K 3.9 4.6  --  4.4  CL 99 96*  --  97*  CO2  --  25  --  26  GLUCOSE 89 111*  --  106*  BUN 36* 50*  --  53*  CREATININE 5.50* 5.97*  --  6.14*  CALCIUM  --  7.7*  --  7.6*  MG  --   --  1.7 2.2  PHOS  --   --   --  3.9   GFR: Estimated Creatinine Clearance: 6.6 mL/min (A) (by C-G formula based on SCr of 6.14 mg/dL (H)). Liver Function Tests: Recent Labs  Lab 11/07/21 2033 11/08/21 0042  AST 37 32  ALT 16 15  ALKPHOS 195* 196*  BILITOT 1.2 0.8  PROT 7.1 6.9  ALBUMIN 2.7*  2.7*   No results for input(s): LIPASE, AMYLASE in the last 168 hours. No results for input(s): AMMONIA in the last 168 hours. Coagulation Profile: No results for input(s): INR, PROTIME in the last 168 hours. Cardiac Enzymes: No results for input(s): CKTOTAL, CKMB, CKMBINDEX, TROPONINI in the last 168 hours. BNP (last 3 results) No results for input(s): PROBNP in the last 8760 hours. HbA1C: No results for input(s): HGBA1C in the last 72 hours. CBG: No results for input(s): GLUCAP in the last 168 hours. Lipid Profile: No results for input(s): CHOL, HDL, LDLCALC, TRIG, CHOLHDL, LDLDIRECT in the last 72 hours. Thyroid Function Tests: No results for input(s): TSH, T4TOTAL, FREET4, T3FREE, THYROIDAB in  the last 72 hours. Anemia Panel: No results for input(s): VITAMINB12, FOLATE, FERRITIN, TIBC, IRON, RETICCTPCT in the last 72 hours. Sepsis Labs: No results for input(s): PROCALCITON, LATICACIDVEN in the last 168 hours.  Recent Results (from the past 240 hour(s))  Resp Panel by RT-PCR (Flu A&B, Covid) Nasopharyngeal Swab     Status: None   Collection Time: 11/07/21  8:21 PM   Specimen: Nasopharyngeal Swab; Nasopharyngeal(NP) swabs in vial transport medium  Result Value Ref Range Status   SARS Coronavirus 2 by RT PCR NEGATIVE NEGATIVE Final    Comment: (NOTE) SARS-CoV-2 target nucleic acids are NOT DETECTED.  The SARS-CoV-2 RNA is generally detectable in upper respiratory specimens during the acute phase of infection. The lowest concentration of SARS-CoV-2 viral copies this assay can detect is 138 copies/mL. A negative result does not preclude SARS-Cov-2 infection and should not be used as the sole basis for treatment or other patient management decisions. A negative result may occur with  improper specimen collection/handling, submission of specimen other than nasopharyngeal swab, presence of viral mutation(s) within the areas targeted by this assay, and inadequate number of  viral copies(<138 copies/mL). A negative result must be combined with clinical observations, patient history, and epidemiological information. The expected result is Negative.  Fact Sheet for Patients:  EntrepreneurPulse.com.au  Fact Sheet for Healthcare Providers:  IncredibleEmployment.be  This test is no t yet approved or cleared by the [place] FDA and  has been authorized for detection and/or diagnosis of SARS-CoV-2 by FDA under an Emergency Use Authorization (EUA). This EUA will remain  in effect (meaning this test can be used) for the duration of the COVID-19 declaration under Section 564(b)(1) of the Act, 21 U.S.C.section 360bbb-3(b)(1), unless the authorization is terminated  or revoked sooner.       Influenza A by PCR NEGATIVE NEGATIVE Final   Influenza B by PCR NEGATIVE NEGATIVE Final    Comment: (NOTE) The Xpert Xpress SARS-CoV-2/FLU/RSV plus assay is intended as an aid in the diagnosis of influenza from Nasopharyngeal swab specimens and should not be used as a sole basis for treatment. Nasal washings and aspirates are unacceptable for Xpert Xpress SARS-CoV-2/FLU/RSV testing.  Fact Sheet for Patients: EntrepreneurPulse.com.au  Fact Sheet for Healthcare Providers: IncredibleEmployment.be  This test is not yet approved or cleared by the [place] FDA and has been authorized for detection and/or diagnosis of SARS-CoV-2 by FDA under an Emergency Use Authorization (EUA). This EUA will remain in effect (meaning this test can be used) for the duration of the COVID-19 declaration under Section 564(b)(1) of the Act, 21 U.S.C. section 360bbb-3(b)(1), unless the authorization is terminated or revoked.  Performed at [facility], [place] [address], [place], [place] [address]       Radiology Studies: DG Chest 2 View  Result Date: 11/07/2021 CLINICAL DATA:  70 year old female with  history of shortness of breath and weakness. EXAM: CHEST - 2 VIEW COMPARISON:  Chest x-ray 10/27/2021. FINDINGS: There is cephalization of the pulmonary vasculature and slight indistinctness of the interstitial markings suggestive of mild pulmonary edema. Trace bilateral pleural effusions. No pneumothorax. Heart size is mildly enlarged. Upper mediastinal contours are remarkable for scratch the upper mediastinal contours are within normal limits. Atherosclerotic calcifications in the thoracic aorta. IMPRESSION: 1. Cardiomegaly with evidence of mild interstitial pulmonary edema and trace bilateral pleural effusions; imaging findings concerning for mild congestive heart failure. 2. Aortic atherosclerosis. Electronically Signed   By: Vinnie Langton M.D.   On: 11/07/2021 05:47  CT Head Wo Contrast  Result Date: 11/07/2021 CLINICAL DATA:  Mental status change EXAM: CT HEAD WITHOUT CONTRAST TECHNIQUE: Contiguous axial images were obtained from the base of the skull through the vertex without intravenous contrast. COMPARISON:  CT head 02/21/2021 FINDINGS: Brain: Moderate to advanced cortical atrophy most prominent in the frontal and temporal lobes. Mild ventricular enlargement consistent with atrophy. Moderate white matter hypodensity bilaterally. Negative for acute infarct, hemorrhage, mass Vascular: Atherosclerotic calcification. Negative for hyperdense vessel Skull: No focal skull lesion. Degenerative changes C1-2 with mild spinal stenosis Sinuses/Orbits: Mild mucosal edema right maxillary sinus. Remaining sinuses clear. Bilateral cataract extraction. Other: None IMPRESSION: Atrophy and chronic microvascular ischemic change. No acute abnormality. Electronically Signed   By: Franchot Gallo M.D.   On: 11/07/2021 08:26   DG Chest Portable 1 View  Result Date: 11/07/2021 CLINICAL DATA:  Initial evaluation for volume overload, missed dialysis. EXAM: PORTABLE CHEST 1 VIEW COMPARISON:  Prior radiograph from earlier  the same day. FINDINGS: Marked cardiomegaly again seen. Mediastinal silhouette stable. Extensive aortic atherosclerosis. Lungs mildly hypoinflated. Persistent diffuse pulmonary interstitial edema, relatively similar as compared to prior radiograph performed earlier the same day. No visible pleural effusion on this single AP view of the chest. No new consolidative airspace disease. No pneumothorax. No new osseous abnormality. Remotely healed left-sided rib fracture noted. IMPRESSION: 1. Cardiomegaly with diffuse pulmonary interstitial edema, relatively similar as compared to prior radiograph performed earlier the same day. 2.  Aortic Atherosclerosis (ICD10-I70.0). Electronically Signed   By: Jeannine Boga M.D.   On: 11/07/2021 21:37    Scheduled Meds:  atorvastatin  80 mg Oral QHS   Chlorhexidine Gluconate Cloth  6 each Topical Q0600   cinacalcet  30 mg Oral Q breakfast   losartan  50 mg Oral Daily   pantoprazole  40 mg Oral Daily   Continuous Infusions:   LOS: 0 days   Time spent: 35 minutes   Darliss Cheney, MD [facility]  11/08/2021, 12:10 PM  Please page via [facility] and do not message via secure chat for anything urgent. Secure chat can be used for anything non urgent.  How to contact the [facility] Attending or Consulting provider [place] or covering provider during after hours [place], for this patient?  Check the care team in [facility] and look for a) attending/consulting [facility] provider listed and b) the [facility] team listed. Page or secure chat [phone]. Log into [email or web address] and use [facility]'s universal password to access. If you do not have the password, please contact the hospital operator. Locate the [facility] provider you are looking for under [facility] and page to a number that you can be directly reached. If you still have difficulty reaching the provider, please page the [facility] (Director on Call) for the Hospitalists listed on amion for assistance.

## 2021-11-08 NOTE — Consult Note (Addendum)
[facility] Renal Consultation Note    Indication for Consultation:  Management of ESRD/hemodialysis; anemia, hypertension/volume and secondary hyperparathyroidism PCP: Dr. Carney Living Nephrologist: Dr. Hollie Salk  HPI: [name] is a 70 y.o. female with ESRD on hemodialysis MWF at [facility]. PMH: Failed KT, HFpEF, HTN, Anemia, SHPT. She has been seen in ED 10 times since 10/02/2021. Last HD 11/05/2021, ran full treatment, left 2.2 kg above OP EDW. She was seen in ED yesterday morning for SOB however ED provider felt episode was related to anxiety and she was discharged home. She returned to ED approximately 12 hours later with C/O SOB. Initial CXR in AM 12/07/2021 Cardiomegaly with evidence of mild interstitial pulmonary edema and trace bilateral pleural effusions; imaging findings concerning for mild congestive heart failure. Repeat CXR 16 hours later  showed cardiomegaly with diffuse pulmonary interstitial edema. She did not miss HD 11/07/2021, she was supposed to have HD today on Holiday Schedule. Repeat labs unremarkable for ESRD patient. She has been admitted per primary for acute hypoxic respiratory failure mostly like pulmonary edema. COVID, Respiratory panel negative. She is s/p a brief hosp earlier this month for the same thng-  this is her 4th trip to the ER this month  Seen in HD unit on HD. She is now on enteric precautions for diarrhea that she says she has had all week but did not mention yesterday when I saw her in ED. Attempting UFG 4.0 Liters. No issues.   Past Medical History:  Diagnosis Date   Adenomatous polyp of colon 10/2010, 2006, 2015   Anemia in CKD (chronic kidney disease) 11/07/2012   s/p blood transfusion.    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  Had some early rejection then stable function for years, then had slow decline of function and went back on hemodialysis in 2012.  Gets HD TTS schedule at [facility] on [facility] still using L forearm AVF.      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[place])    left upper arm   Pulmonary edema 12/2019   Stroke ([place]) 11/2015   TIA   Weight loss, unintentional    Past Surgical History:  Procedure Laterality Date   AV FISTULA PLACEMENT     for dialysis   AV FISTULA PLACEMENT Left 11/22/2015   Procedure: ARTERIOVENOUS (AV) FISTULA CREATION-LEFT BRACHIOCEPHALIC;  Surgeon: Serafina Mitchell, MD;  Location: [place];  Service: Vascular;  Laterality: Left;   AV FISTULA PLACEMENT Right 03/15/2020   Procedure: INSERTION OF ARTERIOVENOUS (AV) GORE-TEX GRAFT ARM ( BRACHIAL AXILLARY );  Surgeon: Katha Cabal, MD;  Location: [facility];  Service: Vascular;  Laterality: Right;   BACK SURGERY     CERVICAL FUSION     CHOLECYSTECTOMY  12/02/2012   Procedure: LAPAROSCOPIC CHOLECYSTECTOMY WITH INTRAOPERATIVE CHOLANGIOGRAM;  Surgeon:  Edward Jolly, MD;  Location: [place] [facility];  Service: General;  Laterality: N/A;   EYE SURGERY Bilateral    cataract surgery   EYE SURGERY Left 2019   laser   HEMATOMA EVACUATION Left 12/24/2016   Procedure: EVACUATION HEMATOMA LEFT UPPER ARM;  Surgeon: Waynetta Sandy, MD;  Location: [place];  Service: Vascular;  Laterality: Left;   I & D EXTREMITY Left 12/31/2016   Procedure: IRRIGATION AND DEBRIDEMENT EXTREMITY;  Surgeon: Angelia Mould, MD;  Location: [place];  Service: Vascular;  Laterality: Left;   INSERTION OF  DIALYSIS CATHETER Right 12/24/2016   Procedure: INSERTION OF DIALYSIS CATHETER;  Surgeon: Waynetta Sandy, MD;  Location: [place];  Service: Vascular;  Laterality: Right;   INSERTION OF DIALYSIS CATHETER Right 02/04/2017   Procedure: INSERTION OF DIALYSIS CATHETER;  Surgeon: Waynetta Sandy, MD;  Location: [place];  Service: Vascular;  Laterality: Right;   [place] Left 10/23/2016   Procedure: Fistulagram;  Surgeon: Elam Dutch, MD;  Location: [place] [facility];  Service: Cardiovascular;  Laterality: Left;   PERIPHERAL VASCULAR THROMBECTOMY Right 04/16/2020   Procedure: PERIPHERAL VASCULAR THROMBECTOMY;  Surgeon: Katha Cabal, MD;  Location: [place] [facility];  Service: Cardiovascular;  Laterality: Right;   RESECTION OF ARTERIOVENOUS FISTULA ANEURYSM Left 11/22/2015   Procedure: RESECTION OF LEFT RADIOCEPHALIC FISTULA ANEURYSM ;  Surgeon: Serafina Mitchell, MD;  Location: [place] [facility];  Service: Vascular;  Laterality: Left;   REVISON OF ARTERIOVENOUS FISTULA Left 12/22/2016   Procedure: REVISON OF LEFT ARTERIOVENOUS FISTULA;  Surgeon: Waynetta Sandy, MD;  Location: [place];  Service: Vascular;  Laterality: Left;   REVISON OF ARTERIOVENOUS FISTULA Left 02/04/2017   Procedure: REVISON OF LEFT UPPER ARM ARTERIOVENOUS FISTULA;  Surgeon: Waynetta Sandy, MD;  Location: [place];  Service: Vascular;  Laterality: Left;   UPPER EXTREMITY ANGIOGRAPHY Bilateral 09/19/2019   Procedure: UPPER EXTREMITY ANGIOGRAPHY;  Surgeon: Katha Cabal, MD;  Location: [place] [facility];  Service: Cardiovascular;  Laterality: Bilateral;   Family History  Problem Relation Age of Onset   Colon cancer Brother    Cancer Brother    Coronary artery disease Mother 7   Hyperlipidemia Mother    Hypertension Mother    Stroke Maternal Aunt    Esophageal cancer Neg Hx    Stomach cancer Neg Hx    Rectal cancer Neg Hx    Social History:   reports that she quit smoking about 29 years ago. Her smoking use included cigarettes. She has never used smokeless tobacco. She reports that she does not drink alcohol and does not use drugs. Allergies  Allergen Reactions   Sulfa Antibiotics Other (See Comments)    Per patient, both parents allergic-so will not take   Adhesive [Tape] Itching   Prior to Admission medications   Medication Sig Start Date End Date Taking? Authorizing Provider  acetaminophen (TYLENOL) 500 MG tablet Take 1 tablet (500 mg total) by mouth every 6 (six) hours as needed. Patient taking differently: Take 1,000 mg by mouth every 6 (six) hours as needed for mild pain. 09/05/18  Yes Law, Bea Graff, PA-C  allopurinol (ZYLOPRIM) 300 MG tablet Take 300 mg by mouth daily. 11/03/21  Yes [provider]  ALPRAZolam (XANAX) 0.25 MG tablet Take 1 tablet (0.25 mg total) by mouth daily as needed for anxiety. 08/18/21  Yes Nita Sells, MD  folic acid-vitamin b complex-vitamin c-selenium-zinc (DIALYVITE) 3 MG TABS  tablet Take 1 tablet by mouth daily.   Yes [provider]  losartan (COZAAR) 50 MG tablet Take 50 mg by mouth daily. 11/03/21  Yes [provider]  ondansetron (ZOFRAN) 8 MG tablet Take 8 mg by mouth 2 (two) times daily as needed for nausea or vomiting.    Yes [provider]  pantoprazole (PROTONIX) 40 MG tablet Take 1 tablet (40 mg total) by mouth daily. 08/18/21  Yes Nita Sells, MD  zolpidem (AMBIEN) 10 MG tablet Take 10 mg by mouth at bedtime as needed for sleep. 02/13/21  Yes [provider]  albuterol (VENTOLIN HFA) 108 (90 Base) MCG/ACT inhaler Inhale 1-2 puffs into the lungs every 6 (six) hours as needed for wheezing or shortness of breath. 10/26/21   Jeanell Sparrow, DO  atorvastatin (LIPITOR) 80 MG tablet TAKE 1 TABLET (80 MG TOTAL) BY MOUTH DAILY. Patient not taking: Reported on 11/08/2021 02/25/21 02/25/22  Dessa Phi, DO  busPIRone (BUSPAR) 10 MG tablet  TAKE 1 TABLET (10 MG TOTAL) BY MOUTH TWO TIMES DAILY. Patient not taking: Reported on 11/08/2021 08/18/21 08/18/22  Nita Sells, MD  cinacalcet (SENSIPAR) 30 MG tablet Take 30 mg by mouth 3 (three) times a week. Mon, Wed, Fri-- Given @ Dialysis 01/04/20   [provider]   Current Facility-Administered Medications  Medication Dose Route Frequency Provider Last Rate Last Admin   acetaminophen (TYLENOL) tablet 650 mg  650 mg Oral Q6H PRN Howerter, Justin B, DO       Or   acetaminophen (TYLENOL) suppository 650 mg  650 mg Rectal Q6H PRN Howerter, Justin B, DO       ALPRAZolam Duanne Moron) tablet 0.25 mg  0.25 mg Oral Daily PRN Howerter, Justin B, DO       atorvastatin (LIPITOR) tablet 80 mg  80 mg Oral QHS Howerter, Justin B, DO       Chlorhexidine Gluconate Cloth 2 % PADS 6 each  6 each Topical Q0600 Corliss Parish, MD   6 each at 11/08/21 0041   cinacalcet (SENSIPAR) tablet 30 mg  30 mg Oral Q breakfast Howerter, Justin B, DO   30 mg at 11/08/21 0958   melatonin tablet 3 mg  3 mg Oral QHS PRN Howerter, Justin B, DO       metoCLOPramide (REGLAN) injection 5 mg  5 mg Intravenous Q6H PRN Mansy, Jan A, MD   5 mg at 11/08/21 0204   pantoprazole (PROTONIX) EC tablet 40 mg  40 mg Oral Daily Howerter, Justin B, DO   40 mg at 11/08/21 0956   Labs: Basic Metabolic Panel: Recent Labs  Lab 11/07/21 0927 11/07/21 2033 11/08/21 0042  NA 138 139 138  K 3.9 4.6 4.4  CL 99 96* 97*  CO2  --  25 26  GLUCOSE 89 111* 106*  BUN 36* 50* 53*  CREATININE 5.50* 5.97* 6.14*  CALCIUM  --  7.7* 7.6*  PHOS  --   --  3.9   Liver Function Tests: Recent Labs  Lab 11/07/21 2033 11/08/21 0042  AST 37 32  ALT 16 15  ALKPHOS 195* 196*  BILITOT 1.2 0.8  PROT 7.1 6.9  ALBUMIN 2.7* 2.7*   No results for input(s): LIPASE, AMYLASE in the last 168 hours. No results for input(s): AMMONIA in the last 168 hours. CBC: Recent Labs  Lab 11/07/21 0852 11/07/21 0927 11/07/21 2033 11/08/21 0042  WBC  4.9  --  6.6 6.4  NEUTROABS 2.8  --  4.4  --  HGB 9.8* 10.9* 9.7* 10.1*  HCT 31.4* 32.0* 31.4* 32.0*  MCV 96.9  --  96.6 95.8  PLT 266  --  280 275   Cardiac Enzymes: No results for input(s): CKTOTAL, CKMB, CKMBINDEX, TROPONINI in the last 168 hours. CBG: No results for input(s): GLUCAP in the last 168 hours. Iron Studies: No results for input(s): IRON, TIBC, TRANSFERRIN, FERRITIN in the last 72 hours. Studies/Results: DG Chest 2 View  Result Date: 11/07/2021 CLINICAL DATA:  70 year old female with history of shortness of breath and weakness. EXAM: CHEST - 2 VIEW COMPARISON:  Chest x-ray 10/27/2021. FINDINGS: There is cephalization of the pulmonary vasculature and slight indistinctness of the interstitial markings suggestive of mild pulmonary edema. Trace bilateral pleural effusions. No pneumothorax. Heart size is mildly enlarged. Upper mediastinal contours are remarkable for scratch the upper mediastinal contours are within normal limits. Atherosclerotic calcifications in the thoracic aorta. IMPRESSION: 1. Cardiomegaly with evidence of mild interstitial pulmonary edema and trace bilateral pleural effusions; imaging findings concerning for mild congestive heart failure. 2. Aortic atherosclerosis. Electronically Signed   By: Vinnie Langton M.D.   On: 11/07/2021 05:47   CT Head Wo Contrast  Result Date: 11/07/2021 CLINICAL DATA:  Mental status change EXAM: CT HEAD WITHOUT CONTRAST TECHNIQUE: Contiguous axial images were obtained from the base of the skull through the vertex without intravenous contrast. COMPARISON:  CT head 02/21/2021 FINDINGS: Brain: Moderate to advanced cortical atrophy most prominent in the frontal and temporal lobes. Mild ventricular enlargement consistent with atrophy. Moderate white matter hypodensity bilaterally. Negative for acute infarct, hemorrhage, mass Vascular: Atherosclerotic calcification. Negative for hyperdense vessel Skull: No focal skull lesion. Degenerative  changes C1-2 with mild spinal stenosis Sinuses/Orbits: Mild mucosal edema right maxillary sinus. Remaining sinuses clear. Bilateral cataract extraction. Other: None IMPRESSION: Atrophy and chronic microvascular ischemic change. No acute abnormality. Electronically Signed   By: Franchot Gallo M.D.   On: 11/07/2021 08:26   DG Chest Portable 1 View  Result Date: 11/07/2021 CLINICAL DATA:  Initial evaluation for volume overload, missed dialysis. EXAM: PORTABLE CHEST 1 VIEW COMPARISON:  Prior radiograph from earlier the same day. FINDINGS: Marked cardiomegaly again seen. Mediastinal silhouette stable. Extensive aortic atherosclerosis. Lungs mildly hypoinflated. Persistent diffuse pulmonary interstitial edema, relatively similar as compared to prior radiograph performed earlier the same day. No visible pleural effusion on this single AP view of the chest. No new consolidative airspace disease. No pneumothorax. No new osseous abnormality. Remotely healed left-sided rib fracture noted. IMPRESSION: 1. Cardiomegaly with diffuse pulmonary interstitial edema, relatively similar as compared to prior radiograph performed earlier the same day. 2.  Aortic Atherosclerosis (ICD10-I70.0). Electronically Signed   By: Jeannine Boga M.D.   On: 11/07/2021 21:37    ROS: As per HPI otherwise negative.   Physical Exam: Vitals:   11/08/21 1244 11/08/21 1250 11/08/21 1330 11/08/21 1400  BP: (!) 113/41 130/64 127/61 (!) 122/42  Pulse: 84 82 83 79  Resp: 14 16 20 15   Temp:      TempSrc:      SpO2:      Weight:      Height:         General: Frail, chronically ill appearing female in no acute distress. Head: Normocephalic, atraumatic, sclera non-icteric, mucus membranes are moist Neck: Supple. JVD not elevated. Lungs: Bilateral breath sounds, decreased in bases otherwise CTAB. No WOB.  Heart: RRR with S1 S2.RRR. No M/R/G. SR PACS on monitor.  Abdomen:  M-S:  Strength and tone appear normal for age.  Lower  extremities: No LE Edema Neuro: Alert and oriented X 3. Moves all extremities spontaneously. Psych:  Responds to questions appropriately with a normal affect. Dialysis Access: L AVG Cannulated at present  Dialysis Orders: [facility] MWF 4 hrs 160NRe 350/600 47.5 kg 2.0 K/2.0 Ca UFP2 AVG -No heparin -Mircera 60 mcg IV q 2 weeks (last dose 10/29/2021) -Hectorol 2 mcg IV TIW -Cinacalcet 30 mg PO TIW  Assessment/Plan:  Acute hypoxic respiratory failure. Hasn't missed HD treatment, was on Holiday schedule. Repeat CXR last PM with diffuse pulmonary interstitial edema. HD today on her usual Holiday schedule. UF as tolerated. Lower EDW on discharge.   ESRD -  MWF. Holiday schedule this past week. Next HD 11/10/2021.  Hypertension/volume-BP variable now on soft side while on HD . Carvedilol 6.25 mg PO BID and Losartan 50 mg daily on OP med list. Home meds currently on hold.    Anemia  - HGB 10.1. ESA due 11/12/2021. Follow HGB.   Metabolic bone disease -   C Ca+ 8.6 PO4 at goal.No binders on OP med list. Continue VDRA, Cinacalcet.   Nutrition - Albumin low. Add protein supps.  Anxiety: Per primary  Rita H. Owens Shark, NP-C 11/08/2021, 2:11 PM  [employer] [phone]  Patient seen and examined, agree with above note with above modifications. Patient is somnolent but hypoxic.  A pattern of behavior that centers around being sob-  HD today on the holiday schedule and plan for next HD on Monday.  Also will adjust as needed HD related medications  Corliss Parish, MD 11/08/2021

## 2021-11-08 NOTE — Procedures (Signed)
Patient was seen on dialysis and the procedure was supervised.  BFR 350  Via AVG BP is  126/61.   Patient appears to be tolerating treatment well-  attempting 3 liters UF due to SOB  Lambert Keto A Blannie Shedlock 11/08/2021

## 2021-11-09 LAB — LACTIC ACID, PLASMA: Lactic Acid, Venous: 2.4 mmol/L (ref 0.5–1.9)

## 2021-11-09 MED ORDER — HEPARIN SODIUM (PORCINE) 5000 UNIT/ML IJ SOLN
5000.0000 [IU] | Freq: Three times a day (TID) | INTRAMUSCULAR | Status: DC
  Administered 2021-11-09 – 2021-11-11 (×7): 5000 [IU] via SUBCUTANEOUS
  Filled 2021-11-09 (×7): qty 1

## 2021-11-09 MED ORDER — ORAL CARE MOUTH RINSE
15.0000 mL | Freq: Two times a day (BID) | OROMUCOSAL | Status: DC
  Administered 2021-11-09 – 2021-11-10 (×2): 15 mL via OROMUCOSAL

## 2021-11-09 NOTE — Evaluation (Signed)
Physical Therapy Evaluation Patient Details Name: [name] MRN: [id number] DOB: 08/28/1951 Today's Date: 11/09/2021  History of Present Illness  Pt is a 70 y.o. female who presented 11/07/21 with SOB and acute on chronic diastolic heart failure. Per chart, she is s/p a brief hosp earlier this month for the same thing-  this is her 4th trip to the ER this month. PMH:  end-stage renal disease on HD MWF, anemia of chronic kidney disease, chronic diastolic heart failure, hypertension, hyperlipidemia, chronically elevated troponin   Clinical Impression  Pt presents with condition above and deficits mentioned below, see PT Problem List. PTA, she reports she was mod I for short household distance mobility with a rollator and living with her mother. Other PLOF and home info below obtained from prior entry 08/17/21 due to pt being very lethargic this date, only speaking a couple words at a time before falling asleep again. Mobility did help arouse her though this session. Pt displays deficits in gross strength, coordination, balance, and activity tolerance, placing her at high risk for falls. Pt is requiring mod-maxA to just perform squat pivot transfers at this time. Per chart, pt's mother has dementia, sister cares for mother and sister sometimes cares for pt as well. At this time, pt would benefit from short-term rehab at a SNF prior to returning home to maximize her independence and safety with all functional mobility. Will continue to follow acutely.     Recommendations for follow up therapy are one component of a multi-disciplinary discharge planning process, led by the attending physician.  Recommendations may be updated based on patient status, additional functional criteria and insurance authorization.  Follow Up Recommendations Skilled nursing-short term rehab (<3 hours/day)    Assistance Recommended at Discharge Frequent or constant Supervision/Assistance  Functional Status Assessment  Patient has had a recent decline in their functional status and demonstrates the ability to make significant improvements in function in a reasonable and predictable amount of time.  Equipment Recommendations  BSC/3in1;Rolling walker (2 wheels);Wheelchair (measurements PT);Wheelchair cushion (measurements PT);Hospital bed (pending progression)    Recommendations for Other Services       Precautions / Restrictions Precautions Precautions: Fall;Other (comment) Precaution Comments: monitor SpO2 and BP Restrictions Weight Bearing Restrictions: No      Mobility  Bed Mobility Overal bed mobility: Needs Assistance Bed Mobility: Supine to Sit;Sit to Supine;Rolling Rolling: Min guard   Supine to sit: Total assist;HOB elevated Sit to supine: Max assist;HOB elevated   General bed mobility comments: Pt able to roll to R using bed rail with min guard and extra time. TA to sit up EOB using bed pads as pt having difficulty staying awake to follow commands. MaxA to manage trunk and legs to return to sit.    Transfers Overall transfer level: Needs assistance Equipment used: 1 person hand held assist Transfers: Sit to/from Stand;Bed to chair/wheelchair/BSC Sit to Stand: Mod assist     Squat pivot transfers: Mod assist;Max assist     General transfer comment: Pt able to initiate power up to stand but requires modA to complete and steady. Mod-maxA to squat pivot bed <> commode with noted knee instability.    Ambulation/Gait               General Gait Details: Deferred this date  Stairs            Wheelchair Mobility    Modified Rankin (Stroke Patients Only)       Balance Overall balance assessment: Needs assistance Sitting-balance  support: Bilateral upper extremity supported;Feet supported Sitting balance-Leahy Scale: Poor Sitting balance - Comments: UE support and min guard-modA to prevent LOB, trunk sway noted with posterior and L LOB intermittently.   Standing  balance support: Bilateral upper extremity supported Standing balance-Leahy Scale: Poor Standing balance comment: ModA and UE support to stand briefly.                             Pertinent Vitals/Pain Pain Assessment: Faces Faces Pain Scale: No hurt Pain Intervention(s): Monitored during session    Home Living Family/patient expects to be discharged to:: Private residence Living Arrangements: Parent Available Help at Discharge: Family;Available PRN/intermittently Type of Home: House Home Access: Stairs to enter Entrance Stairs-Rails: Left Entrance Stairs-Number of Steps: 3   Home Layout: One level Home Equipment: Rollator (4 wheels);Shower seat;Cane - single point Additional Comments: Info obtained from entry 08/17/21 as pt very lethargic this date. Pt's mother has dementia, sister cares for mother and pt reports sister sometimes cares for her as well.    Prior Function Prior Level of Function : Patient poor historian/Family not available;Needs assist             Mobility Comments: Pt very lethargic having difficulty answering questions this date, reports she was mod I for bed mobility, transfers and short gait bouts with her rollator. Family not present to confirm. ADLs Comments: Pt very lethargic having difficulty answering questions this date, reports she was mod I bathing herself on her rollator and preparing her own meals. Family not present to confirm.     Hand Dominance        Extremity/Trunk Assessment   Upper Extremity Assessment Upper Extremity Assessment: Defer to OT evaluation    Lower Extremity Assessment Lower Extremity Assessment: Generalized weakness (difficult to assess due to lethargy)    Cervical / Trunk Assessment Cervical / Trunk Assessment: Normal  Communication   Communication: No difficulties  Cognition Arousal/Alertness: Lethargic Behavior During Therapy: WFL for tasks assessed/performed Overall Cognitive Status: No  family/caregiver present to determine baseline cognitive functioning                                 General Comments: Pt very lethargic, difficult to keep awake to say more than a couple words at a time, thus difficult to assess cognition. Pt requiring tactile and verbal cues to sequence all tasks. More awake at end of session after mobilizing though.        General Comments General comments (skin integrity, edema, etc.): BP soft starting supine in bed with SPB 80-90s and DBP 30-40s, as pt was transitioned into a more upright sitting position using bed controls her BP increased slightly and then after transferring to commode BP was 130s/80s    Exercises     Assessment/Plan    PT Assessment Patient needs continued PT services  PT Problem List Decreased strength;Decreased balance;Decreased activity tolerance;Decreased mobility;Decreased coordination;Decreased cognition;Cardiopulmonary status limiting activity       PT Treatment Interventions DME instruction;Stair training;Gait training;Functional mobility training;Therapeutic activities;Therapeutic exercise;Balance training;Neuromuscular re-education;Cognitive remediation;Patient/family education    PT Goals (Current goals can be found in the Care Plan section)  Acute Rehab PT Goals Patient Stated Goal: to eat breakfast PT Goal Formulation: With patient Time For Goal Achievement: 11/23/21 Potential to Achieve Goals: Fair    Frequency Min 2X/week   Barriers to discharge  Co-evaluation               AM-PAC PT "6 Clicks" Mobility  Outcome Measure Help needed turning from your back to your side while in a flat bed without using bedrails?: A Little Help needed moving from lying on your back to sitting on the side of a flat bed without using bedrails?: Total Help needed moving to and from a bed to a chair (including a wheelchair)?: A Lot Help needed standing up from a chair using your arms (e.g.,  wheelchair or bedside chair)?: A Lot Help needed to walk in hospital room?: Total Help needed climbing 3-5 steps with a railing? : Total 6 Click Score: 10    End of Session Equipment Utilized During Treatment: Oxygen Activity Tolerance: Patient limited by lethargy Patient left: in bed;with call bell/phone within reach;with bed alarm set (with bed in chair-like position) Nurse Communication: Mobility status;Other (comment) (BP) PT Visit Diagnosis: Unsteadiness on feet (R26.81);Other abnormalities of gait and mobility (R26.89);Muscle weakness (generalized) (M62.81);Difficulty in walking, not elsewhere classified (R26.2)    Time: 9937-1696 PT Time Calculation (min) (ACUTE ONLY): 25 min   Charges:   PT Evaluation $PT Eval Moderate Complexity: 1 Mod PT Treatments $Therapeutic Activity: 8-22 mins        Moishe Spice, PT, DPT Acute Rehabilitation Services  Pager: [phone] Office: [phone]   Orvan Falconer 11/09/2021, 9:02 AM

## 2021-11-09 NOTE — Progress Notes (Addendum)
PROGRESS NOTE    [name]  PPI:[id number] DOB: Jul 09, 1951 DOA: 11/07/2021 PCP: [name], MD   Brief Narrative:  [name] is a 70 y.o. female with medical history significant for end-stage renal disease on hemodialysis on Monday, Wednesday, Friday schedule, anemia of chronic kidney disease, chronic diastolic heart failure, hypertension, hyperlipidemia, chronically elevated troponin, who is admitted to [facility] on 11/07/2021 with acute on chronic diastolic heart failure after presenting from home to [facility] ED complaining of shortness of breath.   Assessment & Plan:   Principal Problem:   Acute on chronic diastolic CHF (congestive heart failure) (HCC) Active Problems:   End-stage renal disease on hemodialysis (HCC)   SOB (shortness of breath)   HTN (hypertension)   Hyperlipidemia   Prolonged Q-T interval on ECG   Volume overload   Acute respiratory failure with hypoxia (HCC)   GERD (gastroesophageal reflux disease)   Acute on chronic diastolic (congestive) heart failure (HCC)  Acute hypoxic respiratory failure secondary to acute on chronic congestive heart failure with preserved ejection fraction/ESRD on HD: Per chart, patient has missed 2 sessions of dialysis but she cannot confirm that.  Per nephro note, she did not miss any session and stated she was on holiday schedule.  She states that she feels better.  Denies any shortness of breath still on 2 L of oxygen.  RN advised to wean her down.  Still has crackles.  Nephrology on board for dialysis which is scheduled for tomorrow.  History of prolonged QTC: QTC slightly shorter than her recent known QTC.  Continue to avoid QT prolonging medications and keep magnesium over 2 and potassium over 4.  Anemia of chronic disease: Stable.  GERD: Continue PPI.  Hyperlipidemia: Continue statin.  Essential hypertension: Losartan on hold.  Blood pressure labile.  Per reports, her systolic was in the 06T when she was  laying and then she stood up with physical therapy, jumped to 130.  This is unbelievable, I suspect error in recordings.  Per RN, patient was slightly lethargic when blood pressure was low.  I saw her within 15 minutes after this and blood pressure was normal and patient was fully alert and oriented.  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  Chronic anxiety: Continue as needed Xanax that she takes at home.  DVT prophylaxis: SCDs Start: 11/07/21 2223, will start on heparin.   Code Status: Full Code  Family Communication:  None present at bedside.    Status is: Inpatient  Remains inpatient appropriate because: Labile blood pressure and needs SNF placement, TOC consulted.  Estimated body mass index is 19.11 kg/m as calculated from the following:   Height as of this encounter: 5\' 2"  (1.575 m).   Weight as of this encounter: 47.4 kg.  Pressure Injury 10/27/21 Coccyx Mid Stage 1 -  Intact skin with non-blanchable redness of a localized area usually over a bony prominence. (Active)  10/27/21 2200  Location: Coccyx  Location Orientation: Mid  Staging: Stage 1 -  Intact skin with non-blanchable redness of a localized area usually over a bony prominence.  Wound Description (Comments):   Present on Admission: Yes    Nutritional Assessment: Body mass index is 19.11 kg/m.Marland Kitchen Seen by dietician.  I agree with the assessment and plan as outlined below: Nutrition Status:  Skin Assessment: I have examined the patient's skin and I agree with the  wound assessment as performed by the wound care RN as outlined below: Pressure Injury 10/27/21 Coccyx Mid Stage 1 -  Intact skin with non-blanchable redness of a localized area usually over a bony prominence. (Active)  10/27/21 2200  Location: Coccyx  Location Orientation: Mid  Staging: Stage 1 -  Intact skin with non-blanchable  redness of a localized area usually over a bony prominence.  Wound Description (Comments):   Present on Admission: Yes    Consultants:  Nephrology  Procedures:  None  Antimicrobials:  Anti-infectives (From admission, onward)    None          Subjective: Seen and examined.  During my encounter, patient fully alert and oriented and she denied any complaint.  Objective: Vitals:   11/09/21 0832 11/09/21 0842 11/09/21 1126 11/09/21 1214  BP: (!) 94/42 (!) 133/100  (!) 104/42  Pulse: 78 83  80  Resp: 12 15 15 13   Temp:    98.6 F (37 C)  TempSrc:    Oral  SpO2: 100% 97%  100%  Weight:      Height:        Intake/Output Summary (Last 24 hours) at 11/09/2021 1253 Last data filed at 11/09/2021 0842 Gross per 24 hour  Intake 240 ml  Output 3500 ml  Net -3260 ml    Filed Weights   11/08/21 0258 11/08/21 1230 11/08/21 1630  Weight: 51.7 kg 51.1 kg 47.4 kg    Examination:  General exam: Appears calm and comfortable  Respiratory system: Bibasilar crackles. Respiratory effort normal. Cardiovascular system: S1 & S2 heard, RRR. No JVD, murmurs, rubs, gallops or clicks. No pedal edema. Gastrointestinal system: Abdomen is nondistended, soft and nontender. No organomegaly or masses felt. Normal bowel sounds heard. Central nervous system: Alert and oriented. No focal neurological deficits. Extremities: Symmetric 5 x 5 power. Skin: No rashes, lesions or ulcers.  Psychiatry: Judgement and insight appear poor  Data Reviewed: I have personally reviewed following labs and imaging studies  CBC: Recent Labs  Lab 11/07/21 0852 11/07/21 0927 11/07/21 2033 11/08/21 0042  WBC 4.9  --  6.6 6.4  NEUTROABS 2.8  --  4.4  --   HGB 9.8* 10.9* 9.7* 10.1*  HCT 31.4* 32.0* 31.4* 32.0*  MCV 96.9  --  96.6 95.8  PLT 266  --  280 147    Basic Metabolic Panel: Recent Labs  Lab 11/07/21 0927 11/07/21 2033 11/07/21 2234 11/08/21 0042  NA 138 139  --  138  K 3.9 4.6  --  4.4   CL 99 96*  --  97*  CO2  --  25  --  26  GLUCOSE 89 111*  --  106*  BUN 36* 50*  --  53*  CREATININE 5.50* 5.97*  --  6.14*  CALCIUM  --  7.7*  --  7.6*  MG  --   --  1.7 2.2  PHOS  --   --   --  3.9    GFR: Estimated Creatinine Clearance: 6.3 mL/min (A) (by C-G formula based on SCr of 6.14 mg/dL (H)). Liver Function Tests: Recent Labs  Lab 11/07/21 2033 11/08/21 0042  AST 37 32  ALT 16 15  ALKPHOS 195* 196*  BILITOT 1.2 0.8  PROT 7.1 6.9  ALBUMIN 2.7* 2.7*    No results for input(s): LIPASE, AMYLASE in the last 168 hours. No results for input(s): AMMONIA in the last 168 hours. Coagulation Profile: No results for input(s): INR, PROTIME in the last 168  hours. Cardiac Enzymes: No results for input(s): CKTOTAL, CKMB, CKMBINDEX, TROPONINI in the last 168 hours. BNP (last 3 results) No results for input(s): PROBNP in the last 8760 hours. HbA1C: No results for input(s): HGBA1C in the last 72 hours. CBG: No results for input(s): GLUCAP in the last 168 hours. Lipid Profile: No results for input(s): CHOL, HDL, LDLCALC, TRIG, CHOLHDL, LDLDIRECT in the last 72 hours. Thyroid Function Tests: No results for input(s): TSH, T4TOTAL, FREET4, T3FREE, THYROIDAB in the last 72 hours. Anemia Panel: No results for input(s): VITAMINB12, FOLATE, FERRITIN, TIBC, IRON, RETICCTPCT in the last 72 hours. Sepsis Labs: Recent Labs  Lab 11/09/21 0855  LATICACIDVEN 2.4*    Recent Results (from the past 240 hour(s))  Resp Panel by RT-PCR (Flu A&B, Covid) Nasopharyngeal Swab     Status: None   Collection Time: 11/07/21  8:21 PM   Specimen: Nasopharyngeal Swab; Nasopharyngeal(NP) swabs in vial transport medium  Result Value Ref Range Status   SARS Coronavirus 2 by RT PCR NEGATIVE NEGATIVE Final    Comment: (NOTE) SARS-CoV-2 target nucleic acids are NOT DETECTED.  The SARS-CoV-2 RNA is generally detectable in upper respiratory specimens during the acute phase of infection. The  lowest concentration of SARS-CoV-2 viral copies this assay can detect is 138 copies/mL. A negative result does not preclude SARS-Cov-2 infection and should not be used as the sole basis for treatment or other patient management decisions. A negative result may occur with  improper specimen collection/handling, submission of specimen other than nasopharyngeal swab, presence of viral mutation(s) within the areas targeted by this assay, and inadequate number of viral copies(<138 copies/mL). A negative result must be combined with clinical observations, patient history, and epidemiological information. The expected result is Negative.  Fact Sheet for Patients:  EntrepreneurPulse.com.au  Fact Sheet for Healthcare Providers:  IncredibleEmployment.be  This test is no t yet approved or cleared by the [place] FDA and  has been authorized for detection and/or diagnosis of SARS-CoV-2 by FDA under an Emergency Use Authorization (EUA). This EUA will remain  in effect (meaning this test can be used) for the duration of the COVID-19 declaration under Section 564(b)(1) of the Act, 21 U.S.C.section 360bbb-3(b)(1), unless the authorization is terminated  or revoked sooner.       Influenza A by PCR NEGATIVE NEGATIVE Final   Influenza B by PCR NEGATIVE NEGATIVE Final    Comment: (NOTE) The Xpert Xpress SARS-CoV-2/FLU/RSV plus assay is intended as an aid in the diagnosis of influenza from Nasopharyngeal swab specimens and should not be used as a sole basis for treatment. Nasal washings and aspirates are unacceptable for Xpert Xpress SARS-CoV-2/FLU/RSV testing.  Fact Sheet for Patients: EntrepreneurPulse.com.au  Fact Sheet for Healthcare Providers: IncredibleEmployment.be  This test is not yet approved or cleared by the [place] FDA and has been authorized for detection and/or diagnosis of SARS-CoV-2 by FDA under  an Emergency Use Authorization (EUA). This EUA will remain in effect (meaning this test can be used) for the duration of the COVID-19 declaration under Section 564(b)(1) of the Act, 21 U.S.C. section 360bbb-3(b)(1), unless the authorization is terminated or revoked.  Performed at [facility], [place] [address], [place], [place] [address]        Radiology Studies: DG Chest Portable 1 View  Result Date: 11/07/2021 CLINICAL DATA:  Initial evaluation for volume overload, missed dialysis. EXAM: PORTABLE CHEST 1 VIEW COMPARISON:  Prior radiograph from earlier the same day. FINDINGS: Marked cardiomegaly again seen. Mediastinal silhouette stable.  Extensive aortic atherosclerosis. Lungs mildly hypoinflated. Persistent diffuse pulmonary interstitial edema, relatively similar as compared to prior radiograph performed earlier the same day. No visible pleural effusion on this single AP view of the chest. No new consolidative airspace disease. No pneumothorax. No new osseous abnormality. Remotely healed left-sided rib fracture noted. IMPRESSION: 1. Cardiomegaly with diffuse pulmonary interstitial edema, relatively similar as compared to prior radiograph performed earlier the same day. 2.  Aortic Atherosclerosis (ICD10-I70.0). Electronically Signed   By: Jeannine Boga M.D.   On: 11/07/2021 21:37    Scheduled Meds:  (feeding supplement) PROSource Plus  30 mL Oral BID BM   atorvastatin  80 mg Oral QHS   Chlorhexidine Gluconate Cloth  6 each Topical Q0600   cinacalcet  30 mg Oral Q M,W,F-HD   metoCLOPramide (REGLAN) injection  5 mg Intravenous Q8H   pantoprazole  40 mg Oral Daily   Continuous Infusions:   LOS: 1 day   Time spent: 29 minutes   Darliss Cheney, MD [facility]  11/09/2021, 12:53 PM  Please page via [place] and do not message via secure chat for anything urgent. Secure chat can be used for anything non urgent.  How to contact the [facility] Attending or Consulting provider [place] or covering provider during after hours [place], for this patient?  Check the care team in [facility] and look for a) attending/consulting TR[facility] provider listed and b) the [facility] team listed. Page or secure chat [phone]. Log into [email or web address] and use [place]'s universal password to access. If you do not have the password, please contact the hospital operator. Locate the [facility] provider you are looking for under [facility] and page to a number that you can be directly reached. If you still have difficulty reaching the provider, please page the [facility] (Director on Call) for the Hospitalists listed on amion for assistance.

## 2021-11-09 NOTE — Plan of Care (Signed)

## 2021-11-09 NOTE — Progress Notes (Addendum)
[facility] Progress Note   Subjective: Seen in room. Needed a shake to wake her up. Rec'd Xanax last PM. Says she is just tired. Denies SOB. Net UF 4 liters with HD yesterday.      Objective Vitals:   11/09/21 0822 11/09/21 0827 11/09/21 0832 11/09/21 0842  BP: (!) 82/34 (!) 93/43 (!) 94/42 (!) 133/100  Pulse: 75 76 78 83  Resp: 12 13 12 15   Temp:      TempSrc:      SpO2: 100% 100% 100% 97%  Weight:      Height:       General: Frail, chronically ill appearing female in no acute distress. Lungs: Bilateral breath sounds, decreased in bases otherwise CTAB. No WOB.  Heart: RRR with S1 S2.RRR. No M/R/G. SR PACS on monitor.  Abdomen: NABS, flat. NT. Lower extremities: No LE Edema Dialysis Access: L AVG +T/B  Additional Objective Labs: Basic Metabolic Panel: Recent Labs  Lab 11/07/21 0927 11/07/21 2033 11/08/21 0042  NA 138 139 138  K 3.9 4.6 4.4  CL 99 96* 97*  CO2  --  25 26  GLUCOSE 89 111* 106*  BUN 36* 50* 53*  CREATININE 5.50* 5.97* 6.14*  CALCIUM  --  7.7* 7.6*  PHOS  --   --  3.9   Liver Function Tests: Recent Labs  Lab 11/07/21 2033 11/08/21 0042  AST 37 32  ALT 16 15  ALKPHOS 195* 196*  BILITOT 1.2 0.8  PROT 7.1 6.9  ALBUMIN 2.7* 2.7*   No results for input(s): LIPASE, AMYLASE in the last 168 hours. CBC: Recent Labs  Lab 11/07/21 0852 11/07/21 0927 11/07/21 2033 11/08/21 0042  WBC 4.9  --  6.6 6.4  NEUTROABS 2.8  --  4.4  --   HGB 9.8* 10.9* 9.7* 10.1*  HCT 31.4* 32.0* 31.4* 32.0*  MCV 96.9  --  96.6 95.8  PLT 266  --  280 275   Blood Culture    Component Value Date/Time   SDES BLOOD RIGHT HAND 11/19/2015 0937   SPECREQUEST  11/19/2015 0937    BOTTLES DRAWN AEROBIC AND ANAEROBIC 10CCS BLUE 5CCS RED   CULT NO GROWTH 5 DAYS 11/19/2015 0937   REPTSTATUS 11/24/2015 FINAL 11/19/2015 0937    Cardiac Enzymes: No results for input(s): CKTOTAL, CKMB, CKMBINDEX, TROPONINI in the last 168 hours. CBG: No results for input(s):  GLUCAP in the last 168 hours. Iron Studies: No results for input(s): IRON, TIBC, TRANSFERRIN, FERRITIN in the last 72 hours. @lablastinr3 @ Studies/Results: DG Chest Portable 1 View  Result Date: 11/07/2021 CLINICAL DATA:  Initial evaluation for volume overload, missed dialysis. EXAM: PORTABLE CHEST 1 VIEW COMPARISON:  Prior radiograph from earlier the same day. FINDINGS: Marked cardiomegaly again seen. Mediastinal silhouette stable. Extensive aortic atherosclerosis. Lungs mildly hypoinflated. Persistent diffuse pulmonary interstitial edema, relatively similar as compared to prior radiograph performed earlier the same day. No visible pleural effusion on this single AP view of the chest. No new consolidative airspace disease. No pneumothorax. No new osseous abnormality. Remotely healed left-sided rib fracture noted. IMPRESSION: 1. Cardiomegaly with diffuse pulmonary interstitial edema, relatively similar as compared to prior radiograph performed earlier the same day. 2.  Aortic Atherosclerosis (ICD10-I70.0). Electronically Signed   By: Jeannine Boga M.D.   On: 11/07/2021 21:37   Medications:   (feeding supplement) PROSource Plus  30 mL Oral BID BM   atorvastatin  80 mg Oral QHS   Chlorhexidine Gluconate Cloth  6 each Topical Q0600  cinacalcet  30 mg Oral Q M,W,F-HD   metoCLOPramide (REGLAN) injection  5 mg Intravenous Q8H   pantoprazole  40 mg Oral Daily     Dialysis Orders: [facility] MWF 4 hrs 160NRe 350/600 47.5 kg 2.0 K/2.0 Ca UFP2 AVG -No heparin -Mircera 60 mcg IV q 2 weeks (last dose 10/29/2021) -Hectorol 2 mcg IV TIW -Cinacalcet 30 mg PO TIW   Assessment/Plan:  Acute hypoxic respiratory failure. Hasn't missed HD treatment, was on Holiday schedule. Repeat CXR last PM with diffuse pulmonary interstitial edema. HD 11/08/2021 4 liters removed. SOB resolved with HD.   ESRD -  MWF. Holiday schedule this past week. Next HD 11/10/2021.  Hypertension/volume-BP variable now on soft side  while on HD . Carvedilol 6.25 mg PO BID and Losartan 50 mg daily on OP med list. Home meds currently on hold. Now slightly below EDW 47.4 kg. She has had multiple ED visits for SOB. BP is currently soft but will attempt to lower EDW in HD tomorrow and on discharge. Hopefully lower EDW will help keep her out of ED.   Anemia  - HGB 10.1. ESA due 11/12/2021. Follow HGB.   Metabolic bone disease -   C Ca+ 8.6 PO4 at goal.No binders on OP med list. Continue VDRA, Cinacalcet.   Nutrition - Albumin low. Add protein supps.  Anxiety: Per primary    Rita H. Brown NP-C 11/09/2021, 9:39 AM  [facility] [phone]  Patient seen and examined, agree with above note with above modifications. No new issues-  Will be due next for HD tomorrow-  I agree with holding B meds and challenging EDW in an effort to "fix" this issue of her needing to come to the ED so often-  encouraged her to utilize the HD staff for her concerns instead of going to ED Corliss Parish, MD 11/09/2021

## 2021-11-10 ENCOUNTER — Ambulatory Visit: Payer: Medicare HMO | Admitting: Nurse Practitioner

## 2021-11-10 DIAGNOSIS — I5033 Acute on chronic diastolic (congestive) heart failure: Secondary | ICD-10-CM | POA: Diagnosis not present

## 2021-11-10 MED ORDER — LIDOCAINE HCL (PF) 1 % IJ SOLN: 5.0000 mL | INTRAMUSCULAR | Status: DC | PRN

## 2021-11-10 MED ORDER — PENTAFLUOROPROP-TETRAFLUOROETH EX AERO: 1.0000 "application " | INHALATION_SPRAY | CUTANEOUS | Status: DC | PRN

## 2021-11-10 MED ORDER — HYDRALAZINE HCL 25 MG PO TABS: 25.0000 mg | ORAL_TABLET | Freq: Four times a day (QID) | ORAL | Status: DC | PRN

## 2021-11-10 MED ORDER — SODIUM CHLORIDE 0.9 % IV SOLN: 100.0000 mL | INTRAVENOUS | Status: DC | PRN

## 2021-11-10 MED ORDER — LIDOCAINE-PRILOCAINE 2.5-2.5 % EX CREA: 1.0000 "application " | TOPICAL_CREAM | CUTANEOUS | Status: DC | PRN

## 2021-11-10 MED ORDER — LOSARTAN POTASSIUM 50 MG PO TABS
50.0000 mg | ORAL_TABLET | Freq: Every day | ORAL | Status: DC
  Administered 2021-11-10: 16:00:00 50 mg via ORAL
  Filled 2021-11-10 (×2): qty 1

## 2021-11-10 NOTE — Evaluation (Signed)
Occupational Therapy Evaluation Patient Details Name: [name] MRN: [id number] DOB: 1951-02-09 Today's Date: 11/10/2021   History of Present Illness Pt is a 70 y.o. female who presented 11/07/21 with SOB and acute on chronic diastolic heart failure. Per chart, she is s/p a brief hosp earlier this month for the same thing-  this is her 4th trip to the ER this month. PMH:  end-stage renal disease on HD MWF, anemia of chronic kidney disease, chronic diastolic heart failure, hypertension, hyperlipidemia, chronically elevated troponin   Clinical Impression   PTA, pt lives with parent and reports Modified Independence with ADLs/mobility using Rollator and community/family assist for IADLs. Pt presents now with deficits in strength, endurance, sitting/standing balance and cognition. Pt currently requires Mod A for bed mobility, Min-Mod A for basic transfers using RW with poor tolerance to standing (premature sitting attempts throughout). Pt requires Min A for UB ADLs and up to Total A for LB ADLs. Pt verbalizes understanding of weakness and agreeable for rehab prior to return home. Rec SNF rehab to maximize independence and decrease fall risk with daily tasks.       Recommendations for follow up therapy are one component of a multi-disciplinary discharge planning process, led by the attending physician.  Recommendations may be updated based on patient status, additional functional criteria and insurance authorization.   Follow Up Recommendations  Skilled nursing-short term rehab (<3 hours/day)    Assistance Recommended at Discharge Frequent or constant Supervision/Assistance  Functional Status Assessment  Patient has had a recent decline in their functional status and demonstrates the ability to make significant improvements in function in a reasonable and predictable amount of time.  Equipment Recommendations  BSC/3in1    Recommendations for Other Services       Precautions /  Restrictions Precautions Precautions: Fall;Other (comment) Precaution Comments: monitor SpO2 and BP Restrictions Weight Bearing Restrictions: No      Mobility Bed Mobility Overal bed mobility: Needs Assistance Bed Mobility: Supine to Sit;Sit to Supine;Rolling     Supine to sit: Mod assist;HOB elevated Sit to supine: Min guard   General bed mobility comments: Mod A to lift trunk to EOB, cues to scoot hips forward, min guard to return to supine intermittently during ADLs sitting EOB    Transfers Overall transfer level: Needs assistance Equipment used: Rolling walker (2 wheels) Transfers: Sit to/from Stand Sit to Stand: Min assist           General transfer comment: Min A for 2-3 sit to stands at bedside for peri care. Pt requires Mod A for side step attempts EOB with premature sitting noted      Balance Overall balance assessment: Needs assistance Sitting-balance support: Bilateral upper extremity supported;Feet supported Sitting balance-Leahy Scale: Poor Sitting balance - Comments: UE support EOB, frequent L lateral "LOB" with pt intermittently laying down during ADLs sitting EOB   Standing balance support: Bilateral upper extremity supported Standing balance-Leahy Scale: Poor Standing balance comment: UE support + external assist                           ADL either performed or assessed with clinical judgement   ADL Overall ADL's : Needs assistance/impaired Eating/Feeding: Independent;Sitting   Grooming: Set up;Sitting;Applying deodorant Grooming Details (indicate cue type and reason): sitting EOB Upper Body Bathing: Minimal assistance;Sitting Upper Body Bathing Details (indicate cue type and reason): to bathe back Lower Body Bathing: Maximal assistance;Sit to/from stand   Upper Body Dressing :  Minimal assistance;Sitting Upper Body Dressing Details (indicate cue type and reason): to don clean hospital gown, assist to initiate Lower Body Dressing:  Total assistance;Sit to/from stand Lower Body Dressing Details (indicate cue type and reason): to don socks, pt requesting assist but reports typically able to do this at baseline. Toilet Transfer: Moderate assistance;Stand-pivot;Rolling walker (2 wheels)   Toileting- Clothing Manipulation and Hygiene: Total assistance;Sit to/from stand Toileting - Clothing Manipulation Details (indicate cue type and reason): for peri care after bowel incontinence. light assist to maintain standing balance; cues to maintain standing due to attempts for premature sitting       General ADL Comments: Pt with frequent L lateral "LOB" sittng EOB with ADLs, requires cues/assist to correct. Pt also with increased fatigue, decreased standing tolerance with attempts to premature sit while assisted with peri care     Vision Ability to See in Adequate Light: 0 Adequate Patient Visual Report: No change from baseline Vision Assessment?: No apparent visual deficits     Perception     Praxis      Pertinent Vitals/Pain Pain Assessment: Faces Faces Pain Scale: Hurts a little bit Pain Location: bottom with peri care Pain Descriptors / Indicators: Sore Pain Intervention(s): Monitored during session;Limited activity within patient's tolerance     Hand Dominance Right   Extremity/Trunk Assessment Upper Extremity Assessment Upper Extremity Assessment: Generalized weakness   Lower Extremity Assessment Lower Extremity Assessment: Defer to PT evaluation   Cervical / Trunk Assessment Cervical / Trunk Assessment: Normal   Communication Communication Communication: No difficulties   Cognition Arousal/Alertness: Awake/alert Behavior During Therapy: WFL for tasks assessed/performed;Flat affect Overall Cognitive Status: No family/caregiver present to determine baseline cognitive functioning                                 General Comments: able to answer reason for hospitalization with increased time,  varying PLOF answers - requires specific questions for clear answers; increased time to follow commands  and attend to tasks. benefits from safety cues. Able to recall OT's name during session and reports recall of overhearing convo about potential for rehab prior to going home     General Comments  HR 70s-80s, SpO2 WFL on RA - denies SOB but does endorse fatigue    Exercises     Shoulder Instructions      Home Living Family/patient expects to be discharged to:: Private residence Living Arrangements: Parent Available Help at Discharge: Family;Available PRN/intermittently Type of Home: House Home Access: Stairs to enter [employer] of Steps: 3 Entrance Stairs-Rails: Left Home Layout: One level     Bathroom Shower/Tub: Teacher, early years/pre: Standard Bathroom Accessibility: Yes   Home Equipment: Rollator (4 wheels);Shower seat;Cane - single point   Additional Comments: Pt reports her mother does not need physical assistance - unsure of accuracy.      Prior Functioning/Environment Prior Level of Function : Patient poor historian/Family not available;Needs assist             Mobility Comments: Pt reports mobility with Rollator use, denies any recent falls ADLs Comments: Reports mod I for ADLs, has meals on wheels assist for meals, unclear if she does laundry or other light cleaning tasks, reports use of SCAT for HD transport        OT Problem List: Decreased strength;Decreased activity tolerance;Impaired balance (sitting and/or standing);Decreased safety awareness;Decreased cognition      OT Treatment/Interventions: Self-care/ADL training;Therapeutic exercise;Energy  conservation;DME and/or AE instruction;Therapeutic activities;Patient/family education;Balance training    OT Goals(Current goals can be found in the care plan section) Acute Rehab OT Goals Patient Stated Goal: regain strength, get some rest OT Goal Formulation: With patient Time For  Goal Achievement: 11/24/21 Potential to Achieve Goals: Good  OT Frequency: Min 2X/week   Barriers to D/C:            Co-evaluation              AM-PAC OT "6 Clicks" Daily Activity     Outcome Measure Help from another person eating meals?: None Help from another person taking care of personal grooming?: A Little Help from another person toileting, which includes using toliet, bedpan, or urinal?: Total Help from another person bathing (including washing, rinsing, drying)?: A Lot Help from another person to put on and taking off regular upper body clothing?: A Little Help from another person to put on and taking off regular lower body clothing?: Total 6 Click Score: 14   End of Session Equipment Utilized During Treatment: Rolling walker (2 wheels)  Activity Tolerance: Patient limited by fatigue Patient left: in bed;with call bell/phone within reach;with bed alarm set  OT Visit Diagnosis: Unsteadiness on feet (R26.81);Other abnormalities of gait and mobility (R26.89);Muscle weakness (generalized) (M62.81);Other symptoms and signs involving cognitive function                Time: 2233-6122 OT Time Calculation (min): 17 min Charges:  OT General Charges $OT Visit: 1 Visit OT Evaluation $OT Eval Moderate Complexity: 1 Mod  Malachy Chamber, OTR/L Acute Rehab Services Office: [phone]   Layla Maw 11/10/2021, 7:29 AM

## 2021-11-10 NOTE — Progress Notes (Signed)
Pt scratching at LFA IV and stating it is very uncomfortable. Removed IV per her request. Did not replace as pt currently has no indication for PIV at this time. Will reassess as needed.

## 2021-11-10 NOTE — Progress Notes (Addendum)
  [facility] Progress Note   Subjective: Seen in room, up in chair, pleasant, no SOB  Objective Vitals:   11/10/21 1304 11/10/21 1314 11/10/21 1400 11/10/21 1539  BP: (!) 139/43 (!) 158/59 (!) 158/59 (!) 154/62  Pulse:   78 83  Resp: 12 12 18 18   Temp: 97.8 F (36.6 C) 97.8 F (36.6 C) 97.8 F (36.6 C) 97.8 F (36.6 C)  TempSrc:   Oral Oral  SpO2:  98%  100%  Weight:  47 kg    Height:       General: Frail, chronically ill appearing female in no acute distress. Lungs: clear bilat to bases Heart: RRR with S1 S2.RRR. No M/R/G. SR PACS on monitor.  Abdomen: NABS, flat. NT. Lower extremities: No LE Edema Dialysis Access: L AVG +T/B    OP HD: [place] [facility] MWF 4h    350/600   47.5kg    2/2 bath   P2  AVG   Hep none -Mircera 60 mcg IV q 2 weeks (last dose 10/29/2021) -Hectorol 2 mcg IV TIW -Cinacalcet 30 mg PO TIW   Assessment/Plan:  Acute hypoxic respiratory failure-  no missed HD. SOB resolved with HD x 2 and total UF 5.5 L.  Close to dry wt today.  Off O2 on room air, exam negative.   ESRD -  MWF HD. HD Wed.   HTN/volume- She has had multiple ED visits for SOB. Our records show she was on coreg as well as losartan, will resume coreg. Will attempt to lower EDW on discharge.   Anemia  - HGB 10.1. ESA due 11/12/2021. Follow HGB.   Metabolic bone disease -   C Ca+ 8.6 PO4 at goal.No binders on OP med list. Continue VDRA, Cinacalcet.   Nutrition - Albumin low. Add protein supps.  Anxiety -Per primary Dispo - awaiting SNF placement    Kelly Splinter, MD 11/10/2021, 4:30 PM     Basic Metabolic Panel: Recent Labs  Lab 11/07/21 0927 11/07/21 2033 11/08/21 0042  NA 138 139 138  K 3.9 4.6 4.4  CL 99 96* 97*  CO2  --  25 26  GLUCOSE 89 111* 106*  BUN 36* 50* 53*  CREATININE 5.50* 5.97* 6.14*  CALCIUM  --  7.7* 7.6*  PHOS  --   --  3.9    Medications:   (feeding supplement) PROSource Plus  30 mL Oral BID BM   atorvastatin  80 mg Oral QHS   Chlorhexidine  Gluconate Cloth  6 each Topical Q0600   cinacalcet  30 mg Oral Q M,W,F-HD   heparin  5,000 Units Subcutaneous Q8H   losartan  50 mg Oral Daily   mouth rinse  15 mL Mouth Rinse BID   pantoprazole  40 mg Oral Daily

## 2021-11-10 NOTE — Progress Notes (Signed)
Patient returned from dialysis. 2L fluid removed. VS are stable. Patient is alert and oriented and denies any complaints. Fuller Canada, RN

## 2021-11-10 NOTE — Progress Notes (Deleted)
11/10/2021 [name] [id number] 1951-04-22   CHIEF COMPLAINT:   HISTORY OF PRESENT ILLNESS:  [name]. Gannett is a 70 year old female with a significant past medical history of anxiety, depression, hypertension, hyperlipidemia, coronary artery disease, CHF, ESRD on HD, TIA/CVA   Brother with history of colon cancer  Colonoscopy 01/10/2014 by Dr. Olevia Perches: 1.  Sessile polyp ranging between 5 to 9 mm in size was found at the cecum, polypectomy was performed using snare cautery 2.  There was mild diverticulosis noted in the sigmoid colon 3.  No recurrence of rectal polyp  Flexible sigmoidoscopy 07/23/2011: No residual rectal polyp Moderate sigmoid diverticulosis  Colonoscopy 10/24/2010 by Dr. Fuller Plan: 1) Moderate diverticulosis in the sigmoid colon 2) 14 mm sessile polyp in the rectum  EGD 07/03/2005: 10 mm polyp removed from the gastric antrum 2 cm hiatal hernia depending on the polyp histology, follow up EGD in 2-3 years, bleeding possibly related to a low grade blood loss from a gastric polyp        Echo 02/23/2021: 1. Limited TTE to exclude LV thrombus. Hypokinesis noted of the apical septum, apical anterior segment, and apex. No LV thrombus. Globally EF is preserved ~60%. 2. Left ventricular ejection fraction, by estimation, is 55 to 60%. The left ventricle has normal function. The left ventricle demonstrates regional wall motion abnormalities (see scoring diagram/findings for description).   Past Medical History:  Diagnosis Date   Adenomatous polyp of colon 10/2010, 2006, 2015   Anemia in CKD (chronic kidney disease) 11/07/2012   s/p blood transfusion.    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  Had deceased donor kidney transplant in  1996.  Had some early rejection then stable function for years, then had slow decline of function and went back on hemodialysis in 2012.  Gets HD TTS schedule at [facility] still using L forearm AVF.      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[place])    left upper arm   Pulmonary edema 12/2019   Stroke ([place]) 11/2015   TIA   Weight loss, unintentional    Past Surgical History:  Procedure Laterality Date   AV FISTULA PLACEMENT     for dialysis   AV FISTULA PLACEMENT Left 11/22/2015   Procedure: ARTERIOVENOUS (AV) FISTULA CREATION-LEFT BRACHIOCEPHALIC;  Surgeon: Serafina Mitchell, MD;  Location: [place];  Service: Vascular;  Laterality: Left;   AV FISTULA PLACEMENT Right 03/15/2020   Procedure: INSERTION OF ARTERIOVENOUS (AV) GORE-TEX GRAFT ARM ( BRACHIAL AXILLARY );  Surgeon: Katha Cabal, MD;  Location: [facility];  Service: Vascular;  Laterality: Right;   BACK SURGERY     CERVICAL FUSION     CHOLECYSTECTOMY  12/02/2012   Procedure: LAPAROSCOPIC CHOLECYSTECTOMY WITH INTRAOPERATIVE CHOLANGIOGRAM;  Surgeon: Edward Jolly, MD;  Location: [place];  Service: General;  Laterality: N/A;   EYE SURGERY  Bilateral    cataract surgery   EYE SURGERY Left 2019   laser   HEMATOMA EVACUATION Left 12/24/2016   Procedure: EVACUATION HEMATOMA LEFT UPPER ARM;  Surgeon: Waynetta Sandy, MD;  Location: [place];  Service: Vascular;  Laterality: Left;   I & D EXTREMITY Left 12/31/2016   Procedure: IRRIGATION AND DEBRIDEMENT EXTREMITY;  Surgeon: Angelia Mould, MD;  Location: [place];  Service: Vascular;  Laterality: Left;   INSERTION OF DIALYSIS CATHETER Right 12/24/2016   Procedure: INSERTION OF DIALYSIS CATHETER;   Surgeon: Waynetta Sandy, MD;  Location: [place];  Service: Vascular;  Laterality: Right;   INSERTION OF DIALYSIS CATHETER Right 02/04/2017   Procedure: INSERTION OF DIALYSIS CATHETER;  Surgeon: Waynetta Sandy, MD;  Location: [place];  Service: Vascular;  Laterality: Right;   [place] Left 10/23/2016   Procedure: Fistulagram;  Surgeon: Elam Dutch, MD;  Location: [place] [facility];  Service: Cardiovascular;  Laterality: Left;   PERIPHERAL VASCULAR THROMBECTOMY Right 04/16/2020   Procedure: PERIPHERAL VASCULAR THROMBECTOMY;  Surgeon: Katha Cabal, MD;  Location: [place] [facility];  Service: Cardiovascular;  Laterality: Right;   RESECTION OF ARTERIOVENOUS FISTULA ANEURYSM Left 11/22/2015   Procedure: RESECTION OF LEFT RADIOCEPHALIC FISTULA ANEURYSM ;  Surgeon: Serafina Mitchell, MD;  Location: [place] [facility];  Service: Vascular;  Laterality: Left;   REVISON OF ARTERIOVENOUS FISTULA Left 12/22/2016   Procedure: REVISON OF LEFT ARTERIOVENOUS FISTULA;  Surgeon: Waynetta Sandy, MD;  Location: [place];  Service: Vascular;  Laterality: Left;   REVISON OF ARTERIOVENOUS FISTULA Left 02/04/2017   Procedure: REVISON OF LEFT UPPER ARM ARTERIOVENOUS FISTULA;  Surgeon: Waynetta Sandy, MD;  Location: [place];  Service: Vascular;  Laterality: Left;   UPPER EXTREMITY ANGIOGRAPHY Bilateral 09/19/2019   Procedure: UPPER EXTREMITY ANGIOGRAPHY;  Surgeon: Katha Cabal, MD;  Location: [place] [facility];  Service: Cardiovascular;  Laterality: Bilateral;   Social History:  Family History:    reports that she quit smoking about 29 years ago. Her smoking use included cigarettes. She has never used smokeless tobacco. She reports that she does not drink alcohol and does not use drugs. family history includes Cancer in her brother; Colon cancer in her brother; Coronary artery disease (age of onset: 56) in her mother; Hyperlipidemia in  her mother; Hypertension in her mother; Stroke in her maternal aunt. Allergies  Allergen Reactions   Sulfa Antibiotics Other (See Comments)    Per patient, both parents allergic-so will not take   Adhesive [Tape] Itching      Facility-Administered Encounter Medications as of 11/10/2021  Medication   (feeding supplement) PROSource Plus liquid 30 mL   acetaminophen (TYLENOL) tablet 650 mg   Or   acetaminophen (TYLENOL) suppository 650 mg   ALPRAZolam (XANAX) tablet 0.25 mg   atorvastatin (LIPITOR) tablet 80 mg   Chlorhexidine Gluconate Cloth 2 % PADS 6 each   cinacalcet (SENSIPAR) tablet 30 mg   heparin injection 5,000 Units   MEDLINE mouth rinse   melatonin tablet 3 mg   pantoprazole (PROTONIX) EC tablet 40 mg   Outpatient Encounter Medications as of 11/10/2021  Medication Sig   acetaminophen (TYLENOL) 500 MG tablet Take 1 tablet (500 mg total) by mouth every 6 (six) hours as needed. (Patient taking differently: Take 1,000 mg by mouth every 6 (six) hours as needed for mild pain.)   albuterol (VENTOLIN HFA) 108 (90 Base) MCG/ACT inhaler Inhale 1-2 puffs  into the lungs every 6 (six) hours as needed for wheezing or shortness of breath.   allopurinol (ZYLOPRIM) 300 MG tablet Take 300 mg by mouth daily.   ALPRAZolam (XANAX) 0.25 MG tablet Take 1 tablet (0.25 mg total) by mouth daily as needed for anxiety.   atorvastatin (LIPITOR) 80 MG tablet TAKE 1 TABLET (80 MG TOTAL) BY MOUTH DAILY. (Patient not taking: Reported on 11/08/2021)   busPIRone (BUSPAR) 10 MG tablet TAKE 1 TABLET (10 MG TOTAL) BY MOUTH TWO TIMES DAILY. (Patient not taking: Reported on 11/08/2021)   cinacalcet (SENSIPAR) 30 MG tablet Take 30 mg by mouth 3 (three) times a week. Mon, Wed, Fri-- Given @ Dialysis   folic acid-vitamin b complex-vitamin c-selenium-zinc (DIALYVITE) 3 MG TABS tablet Take 1 tablet by mouth daily.   losartan (COZAAR) 50 MG tablet Take 50 mg by mouth daily.   ondansetron (ZOFRAN) 8 MG tablet Take 8 mg  by mouth 2 (two) times daily as needed for nausea or vomiting.    pantoprazole (PROTONIX) 40 MG tablet Take 1 tablet (40 mg total) by mouth daily.   zolpidem (AMBIEN) 10 MG tablet Take 10 mg by mouth at bedtime as needed for sleep.     REVIEW OF SYSTEMS: All other systems reviewed and negative except where noted in the History of Present Illness.  Gen: Denies fever, sweats or chills. No weight loss.  CV: Denies chest pain, palpitations or edema. Resp: Denies cough, shortness of breath of hemoptysis.  GI: Denies heartburn, dysphagia, stomach or lower abdominal pain. No diarrhea or constipation.  GU : Denies urinary burning, blood in urine, increased urinary frequency or incontinence. MS: Denies joint pain, muscles aches or weakness. Derm: Denies rash, itchiness, skin lesions or unhealing ulcers. Psych: Denies depression, anxiety, memory loss, suicidal ideation and confusion. Heme: Denies bruising, bleeding. Neuro:  Denies headaches, dizziness or paresthesias. Endo:  Denies any problems with DM, thyroid or adrenal function.   PHYSICAL EXAM: There were no vitals taken for this visit. General: Well developed ... in no acute distress. Head: Normocephalic and atraumatic. Eyes:  Sclerae non-icteric, conjunctive pink. Ears: Normal auditory acuity. Mouth: Dentition intact. No ulcers or lesions.  Neck: Supple, no lymphadenopathy or thyromegaly.  Lungs: Clear bilaterally to auscultation without wheezes, crackles or rhonchi. Heart: Regular rate and rhythm. No murmur, rub or gallop appreciated.  Abdomen: Soft, nontender, non distended. No masses. No hepatosplenomegaly. Normoactive bowel sounds x 4 quadrants.  Rectal:  Musculoskeletal: Symmetrical with no gross deformities. Skin: Warm and dry. No rash or lesions on visible extremities. Extremities: No edema. Neurological: Alert oriented x 4, no focal deficits.  Psychological:  Alert and cooperative. Normal mood and affect.  ASSESSMENT AND  PLAN:    CC:  [name], MD

## 2021-11-10 NOTE — Progress Notes (Signed)
PROGRESS NOTE    [name]  GNF:[id number] DOB: 08-17-1951 DOA: 11/07/2021 PCP: [name], MD   Brief Narrative:  [name] is a 70 y.o. female with medical history significant for end-stage renal disease on hemodialysis on Monday, Wednesday, Friday schedule, anemia of chronic kidney disease, chronic diastolic heart failure, hypertension, hyperlipidemia, chronically elevated troponin, who is admitted to [facility] on 11/07/2021 with acute on chronic diastolic heart failure after presenting from home to [facility] ED complaining of shortness of breath.   Assessment & Plan:   Principal Problem:   Acute on chronic diastolic CHF (congestive heart failure) (HCC) Active Problems:   End-stage renal disease on hemodialysis (HCC)   SOB (shortness of breath)   HTN (hypertension)   Hyperlipidemia   Prolonged Q-T interval on ECG   Volume overload   Acute respiratory failure with hypoxia (HCC)   GERD (gastroesophageal reflux disease)   Acute on chronic diastolic (congestive) heart failure (HCC)  Acute hypoxic respiratory failure secondary to acute on chronic congestive heart failure with preserved ejection fraction/ESRD on HD: Per chart, patient has missed 2 sessions of dialysis but she cannot confirm that.  Per nephro note, she did not miss any session and stated she was on holiday schedule.  She states that she feels better.  She is now on room air.  Continue dialysis per nephrology.  History of prolonged QTC: QTC slightly shorter than her recent known QTC.  Continue to avoid QT prolonging medications and keep magnesium over 2 and potassium over 4.  Anemia of chronic disease: Stable.  GERD: Continue PPI.  Hyperlipidemia: Continue statin.  Essential hypertension: Losartan on hold.  Blood pressure labile.  Initially we were dealing with hypotension, then she became normotensive and now she is hypertensive.  I will resume her losartan and add hydralazine now.  Chronic  anxiety: Continue as needed Xanax that she takes at home.  DVT prophylaxis: heparin injection 5,000 Units Start: 11/09/21 1415 SCDs Start: 11/09/21 1321, will start on heparin.   Code Status: Full Code  Family Communication:  None present at bedside.    Status is: Inpatient  Remains inpatient appropriate because: Patient now medically stable, waiting for TOC to arrange SNF bed placement.  Estimated body mass index is 19.64 kg/m as calculated from the following:   Height as of this encounter: 5\' 2"  (1.575 m).   Weight as of this encounter: 48.7 kg.  Pressure Injury 10/27/21 Coccyx Mid Stage 1 -  Intact skin with non-blanchable redness of a localized area usually over a bony prominence. (Active)  10/27/21 2200  Location: Coccyx  Location Orientation: Mid  Staging: Stage 1 -  Intact skin with non-blanchable redness of a localized area usually over a bony prominence.  Wound Description (Comments):   Present on Admission: Yes    Nutritional Assessment: Body mass index is 19.64 kg/m.Marland Kitchen Seen by dietician.  I agree with the assessment and plan as outlined below: Nutrition Status:  Skin Assessment: I have examined the patient's skin and I agree with the wound assessment as performed by the wound care RN as outlined below: Pressure Injury 10/27/21 Coccyx Mid Stage 1 -  Intact skin with non-blanchable redness of a localized area usually over a bony prominence. (Active)  10/27/21 2200  Location: Coccyx  Location Orientation: Mid  Staging: Stage 1 -  Intact skin with non-blanchable redness of a localized area usually over a bony prominence.  Wound Description (Comments):   Present on Admission: Yes  Consultants:  Nephrology  Procedures:  None  Antimicrobials:  Anti-infectives (From admission, onward)    None          Subjective: Patient seen and examined.  She has no complaints.  She is fully alert and oriented.  Objective: Vitals:   11/10/21 1100 11/10/21 1130  11/10/21 1200 11/10/21 1230  BP: (!) 166/61 (!) 164/46 (!) 150/51 (!) 193/41  Pulse: 77 83 84 81  Resp: 13 13 17 17   Temp:      TempSrc:      SpO2:      Weight:      Height:        Intake/Output Summary (Last 24 hours) at 11/10/2021 1251 Last data filed at 11/09/2021 1800 Gross per 24 hour  Intake 340 ml  Output --  Net 340 ml    Filed Weights   11/08/21 1630 11/10/21 0300 11/10/21 0951  Weight: 47.4 kg 47.6 kg 48.7 kg    Examination:  General exam: Appears calm and comfortable  Respiratory system: Clear to auscultation. Respiratory effort normal. Cardiovascular system: S1 & S2 heard, RRR. No JVD, murmurs, rubs, gallops or clicks. No pedal edema. Gastrointestinal system: Abdomen is nondistended, soft and nontender. No organomegaly or masses felt. Normal bowel sounds heard. Central nervous system: Alert and oriented. No focal neurological deficits. Extremities: Symmetric 5 x 5 power. Skin: No rashes, lesions or ulcers.  Psychiatry: Judgement and insight appear normal. Mood & affect appropriate.   Data Reviewed: I have personally reviewed following labs and imaging studies  CBC: Recent Labs  Lab 11/07/21 0852 11/07/21 0927 11/07/21 2033 11/08/21 0042  WBC 4.9  --  6.6 6.4  NEUTROABS 2.8  --  4.4  --   HGB 9.8* 10.9* 9.7* 10.1*  HCT 31.4* 32.0* 31.4* 32.0*  MCV 96.9  --  96.6 95.8  PLT 266  --  280 751    Basic Metabolic Panel: Recent Labs  Lab 11/07/21 0927 11/07/21 2033 11/07/21 2234 11/08/21 0042  NA 138 139  --  138  K 3.9 4.6  --  4.4  CL 99 96*  --  97*  CO2  --  25  --  26  GLUCOSE 89 111*  --  106*  BUN 36* 50*  --  53*  CREATININE 5.50* 5.97*  --  6.14*  CALCIUM  --  7.7*  --  7.6*  MG  --   --  1.7 2.2  PHOS  --   --   --  3.9    GFR: Estimated Creatinine Clearance: 6.5 mL/min (A) (by C-G formula based on SCr of 6.14 mg/dL (H)). Liver Function Tests: Recent Labs  Lab 11/07/21 2033 11/08/21 0042  AST 37 32  ALT 16 15  ALKPHOS 195*  196*  BILITOT 1.2 0.8  PROT 7.1 6.9  ALBUMIN 2.7* 2.7*    No results for input(s): LIPASE, AMYLASE in the last 168 hours. No results for input(s): AMMONIA in the last 168 hours. Coagulation Profile: No results for input(s): INR, PROTIME in the last 168 hours. Cardiac Enzymes: No results for input(s): CKTOTAL, CKMB, CKMBINDEX, TROPONINI in the last 168 hours. BNP (last 3 results) No results for input(s): PROBNP in the last 8760 hours. HbA1C: No results for input(s): HGBA1C in the last 72 hours. CBG: No results for input(s): GLUCAP in the last 168 hours. Lipid Profile: No results for input(s): CHOL, HDL, LDLCALC, TRIG, CHOLHDL, LDLDIRECT in the last 72 hours. Thyroid Function Tests: No results for input(s): TSH,  T4TOTAL, FREET4, T3FREE, THYROIDAB in the last 72 hours. Anemia Panel: No results for input(s): VITAMINB12, FOLATE, FERRITIN, TIBC, IRON, RETICCTPCT in the last 72 hours. Sepsis Labs: Recent Labs  Lab 11/09/21 0855  LATICACIDVEN 2.4*     Recent Results (from the past 240 hour(s))  Resp Panel by RT-PCR (Flu A&B, Covid) Nasopharyngeal Swab     Status: None   Collection Time: 11/07/21  8:21 PM   Specimen: Nasopharyngeal Swab; Nasopharyngeal(NP) swabs in vial transport medium  Result Value Ref Range Status   SARS Coronavirus 2 by RT PCR NEGATIVE NEGATIVE Final    Comment: (NOTE) SARS-CoV-2 target nucleic acids are NOT DETECTED.  The SARS-CoV-2 RNA is generally detectable in upper respiratory specimens during the acute phase of infection. The lowest concentration of SARS-CoV-2 viral copies this assay can detect is 138 copies/mL. A negative result does not preclude SARS-Cov-2 infection and should not be used as the sole basis for treatment or other patient management decisions. A negative result may occur with  improper specimen collection/handling, submission of specimen other than nasopharyngeal swab, presence of viral mutation(s) within the areas targeted by this  assay, and inadequate number of viral copies(<138 copies/mL). A negative result must be combined with clinical observations, patient history, and epidemiological information. The expected result is Negative.  Fact Sheet for Patients:  EntrepreneurPulse.com.au  Fact Sheet for Healthcare Providers:  IncredibleEmployment.be  This test is no t yet approved or cleared by the [place] FDA and  has been authorized for detection and/or diagnosis of SARS-CoV-2 by FDA under an Emergency Use Authorization (EUA). This EUA will remain  in effect (meaning this test can be used) for the duration of the COVID-19 declaration under Section 564(b)(1) of the Act, 21 U.S.C.section 360bbb-3(b)(1), unless the authorization is terminated  or revoked sooner.       Influenza A by PCR NEGATIVE NEGATIVE Final   Influenza B by PCR NEGATIVE NEGATIVE Final    Comment: (NOTE) The Xpert Xpress SARS-CoV-2/FLU/RSV plus assay is intended as an aid in the diagnosis of influenza from Nasopharyngeal swab specimens and should not be used as a sole basis for treatment. Nasal washings and aspirates are unacceptable for Xpert Xpress SARS-CoV-2/FLU/RSV testing.  Fact Sheet for Patients: EntrepreneurPulse.com.au  Fact Sheet for Healthcare Providers: IncredibleEmployment.be  This test is not yet approved or cleared by the [place] FDA and has been authorized for detection and/or diagnosis of SARS-CoV-2 by FDA under an Emergency Use Authorization (EUA). This EUA will remain in effect (meaning this test can be used) for the duration of the COVID-19 declaration under Section 564(b)(1) of the Act, 21 U.S.C. section 360bbb-3(b)(1), unless the authorization is terminated or revoked.  Performed at [facility], [place] [address], [place], [place] [address]        Radiology Studies: No results found.  Scheduled Meds:  (feeding  supplement) PROSource Plus  30 mL Oral BID BM   atorvastatin  80 mg Oral QHS   Chlorhexidine Gluconate Cloth  6 each Topical Q0600   cinacalcet  30 mg Oral Q M,W,F-HD   heparin  5,000 Units Subcutaneous Q8H   mouth rinse  15 mL Mouth Rinse BID   pantoprazole  40 mg Oral Daily   Continuous Infusions:  sodium chloride     sodium chloride       LOS: 2 days   Time spent: 27 minutes   Darliss Cheney, MD [facility]  11/10/2021, 12:51 PM  Please page via [place] and do not  message via secure chat for anything urgent. Secure chat can be used for anything non urgent.  How to contact the [facility] Attending or Consulting provider [place] or covering provider during after hours [place], for this patient?  Check the care team in [facility] and look for a) attending/consulting TRH provider listed and b) the [facility] team listed. Page or secure chat [phone]. Log into [email or web address] and use [place]'s universal password to access. If you do not have the password, please contact the hospital operator. Locate the [facility] provider you are looking for under [facility] and page to a number that you can be directly reached. If you still have difficulty reaching the provider, please page the [facility] (Director on Call) for the Hospitalists listed on amion for assistance.

## 2021-11-10 NOTE — TOC Initial Note (Signed)
Transition of Care [facility]) - Initial/Assessment Note    Patient Details  Name: [name] MRN: [id number] Date of Birth: 1951/12/09  Transition of Care [facility]) CM/SW Contact:    Vinie Sill, LCSW Phone Number: 11/10/2021, 6:27 PM  Clinical Narrative:                  CSW met with patient at bedside. CSW introduced self and explained role. CSW discussed PT/OT recommendation of short term rehab at [facility]. Patient states she is agreeable to rehab.   Patient was admitted at [facility] 08/20/2021- and would be in co-pay status if she returned to rehab. Patient explained her son was working with DSS to try and get some help but she was not sure what that was or who he has been communicating with at [place]. CSW was given permission to call her son, Quillian Quince. CSW spoke with Quillian Quince, he confirmed he received LTC Medicaid application from [employer] @ [phone] [facility] DSS- he plans on applying for Medicaid. He states they can not afford co-pay and patient will have to discharge home. CSW requested to call back and update after meeting w/DSS worker and determine if FL2 is needed before patient leaves the hospital.  CSW will still send out referrals to SNF incase a SNF agrees to make arrangements with family. Including, SNF options are limited due to patient needs dialysis(since family is applying for medicaid)   Family understand patient is medially ready for discharge, per MD CSW will follow up family with tomorrow and finalize d/c plan- if unable to secure SNF that is willing to work with family  Thurmond Butts, MSW, LCSW Clinical Social Worker     Expected Discharge Plan: Skilled Nursing Facility Barriers to Discharge: Ship broker, SNF Pending bed offer (family unable to make co-pay payments for SNF)   Patient Goals and CMS Choice        Expected Discharge Plan and Services Expected Discharge Plan: [place] In-house Referral: Clinical Social  Work     Living arrangements for the past 2 months: Single Family Home                                      Prior Living Arrangements/Services Living arrangements for the past 2 months: Single Family Home Lives with:: Self (mother) Patient language and need for interpreter reviewed:: No        Need for Family Participation in Patient Care: Yes (Comment) Care giver support system in place?: Yes (comment)   Criminal Activity/Legal Involvement Pertinent to Current Situation/Hospitalization: No - Comment as needed  Activities of Daily Living Home Assistive Devices/Equipment: Environmental consultant (specify type) ADL Screening (condition at time of admission) Patient's cognitive ability adequate to safely complete daily activities?: Yes Is the patient deaf or have difficulty hearing?: No Does the patient have difficulty seeing, even when wearing glasses/contacts?: No Does the patient have difficulty concentrating, remembering, or making decisions?: No Patient able to express need for assistance with ADLs?: Yes Does the patient have difficulty dressing or bathing?: No Independently performs ADLs?: Yes (appropriate for developmental age) Does the patient have difficulty walking or climbing stairs?: Yes Weakness of Legs: Both Weakness of Arms/Hands: None  Permission Sought/Granted Permission sought to share information with : Family Supports Permission granted to share information with : Yes, Verbal Permission Granted  Share Information with NAME: Kathrynn Ducking  Permission granted to share  info w AGENCY: SNFs  Permission granted to share info w Relationship: son  Permission granted to share info w Contact Information: [phone]  Emotional Assessment Appearance:: Appears stated age Attitude/Demeanor/Rapport: Engaged Affect (typically observed): Accepting, Pleasant, Appropriate Orientation: : Oriented to Self, Oriented to Place, Oriented to  Time, Oriented to Situation Alcohol /  Substance Use: Not Applicable Psych Involvement: No (comment)  Admission diagnosis:  Hypoxia [R09.02] ESRD (end stage renal disease) ([place]) [N18.6] Volume overload [E87.70] Acute on chronic diastolic (congestive) heart failure (HCC) [I50.33] Patient Active Problem List   Diagnosis Date Noted   Acute on chronic diastolic (congestive) heart failure ([place]) 11/08/2021   GERD (gastroesophageal reflux disease)    Protein-calorie malnutrition, severe 08/12/2021   Diverticulitis of colon with perforation 08/06/2021   Acute respiratory failure with hypoxia ([place]) 08/06/2021   Major depressive disorder, recurrent episode, moderate (HCC) 02/23/2021   TIA (transient ischemic attack) 02/21/2021   Prolonged QT interval 02/21/2021   NSTEMI (non-ST elevated myocardial infarction) ([place]) 05/14/2020   Volume overload 05/13/2020   Hypotension after procedure 03/16/2020   Chest pain 03/16/2020   Change in mental state 03/15/2020   Ventricular tachycardia 03/15/2020   Seizure ([place]) 03/15/2020   Unresponsive episode 03/15/2020   Pulmonary edema 22/29/7989   Complication of vascular access for dialysis 12/21/2019   Breakdown (mechanical) of surgically created arteriovenous fistula, initial encounter ([place]) 12/18/2019   Weakness 10/11/2019   Aneurysm artery, 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   Dependence on renal dialysis ([place]) 07/24/2019   Iron deficiency anemia, unspecified 06/09/2019   Age-related osteoporosis without current pathological fracture 04/17/2019   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   Coagulation defect, unspecified ([place]) 04/17/2019   Diarrhea, unspecified 04/17/2019   Encounter for screening for depression 04/17/2019   Headache, unspecified 04/17/2019   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   Pain, unspecified 04/17/2019   Polyp of colon 04/17/2019   Primary generalized (osteo)arthritis 04/17/2019   Pruritus, unspecified 04/17/2019   Pure hypercholesterolemia,  unspecified 04/17/2019   Secondary hyperparathyroidism of renal origin ([place]) 04/17/2019   Gastro-esophageal reflux disease without esophagitis 04/17/2019   Essential (primary) hypertension 04/17/2019   Hyperlipidemia, unspecified 04/17/2019   Transient cerebral ischemic attack, unspecified 04/17/2019   Hypoxemia 12/13/2018   Prolonged Q-T interval on ECG 02/24/2017   Hypokalemia 02/24/2017   Acute ischemic stroke ([place]) 02/23/2017   Malnutrition of moderate degree 12/24/2016   Problem with dialysis access ([place]) 12/21/2016   Acute ischemic colitis ([place]) 05/16/2016   Colitis 05/15/2016   Rectal bleeding 05/15/2016   Neurologic abnormality 11/19/2015   Hyperkalemia 11/19/2015   Anxiety 11/19/2015   Insomnia 11/19/2015   Gait instability    Stroke-like symptom 11/16/2015   Vestibular neuritis 11/16/2015   Stroke (cerebrum) ([place]) 11/16/2015   Dizziness 05/09/2015   Ataxia 05/09/2015   H/O: CVA (cerebrovascular accident) 05/09/2015   Left facial numbness 05/09/2015   Left leg numbness 05/09/2015   Hyperlipidemia    Acute on chronic diastolic CHF (congestive heart failure) (HCC)    SOB (shortness of breath) 04/01/2013   HTN (hypertension) 04/01/2013   Cholecystitis, acute 11/07/2012   End-stage renal disease on hemodialysis ([place]) 11/07/2012   Anemia in chronic kidney disease 11/07/2012   Dyspnea 12/31/2011   PCP:  [name], MD Pharmacy:   [facility] [address], [place] [address] [place] [address] Phone: [phone] Fax: [place], [place] - [address] AT [place] [address]  [place] [address] Phone: [phone] Fax: [phone]     Social Determinants of Health (SDOH) Interventions    Readmission Risk Interventions Readmission Risk Prevention Plan 08/18/2021  Transportation Screening Complete  Medication Review Press photographer) Complete  PCP  or Specialist appointment within 3-5 days of discharge Complete  HRI or Home Care Consult Complete  SW Recovery Care/Counseling Consult Complete  Palliative Care Screening Not [place] Complete  Some recent data might be hidden

## 2021-11-11 DIAGNOSIS — I5033 Acute on chronic diastolic (congestive) heart failure: Secondary | ICD-10-CM | POA: Diagnosis not present

## 2021-11-11 NOTE — Discharge Summary (Signed)
Physician Discharge Summary  [name] WEX:[id number] DOB: 1951/08/04 DOA: 11/07/2021  PCP: [name], MD  Admit date: 11/07/2021 Discharge date: 11/11/2021 30 Day Unplanned Readmission Risk Score    Flowsheet Row ED to Hosp-Admission (Current) from 11/07/2021 in [facility] 4E CV SURGICAL PROGRESSIVE CARE  30 Day Unplanned Readmission Risk Score (%) 39.93 Filed at 11/11/2021 1200       This score is the patient's risk of an unplanned readmission within 30 days of being discharged (0 -100%). The score is based on dignosis, age, lab data, medications, orders, and past utilization.   Low:  0-14.9   Medium: 15-21.9   High: 22-29.9   Extreme: 30 and above          Admitted From: Home Disposition: Home  Recommendations for Outpatient Follow-up:  Follow up with PCP in 1-2 weeks Please obtain BMP/CBC in one week Follow-up with routine hemodialysis Please follow up with your PCP on the following pending results: Unresulted Labs (From admission, onward)     Start     Ordered   11/08/21 0842  C Difficile Quick Screen w PCR reflex  (C Difficile quick screen w PCR reflex panel )  Once, for 24 hours,   TIMED       References:    CDiff Information Tool   11/08/21 0841   Signed and Held  Hepatitis B surface antigen  (New Admission Hemo Labs (Hepatitis B))  Once,   R       Question:  Specimen collection method  Answer:  Lab=Lab collect   Signed and Held   Signed and Held  Hepatitis B surface antibody  (New Admission Hemo Labs (Hepatitis B))  Once,   R       Question:  Specimen collection method  Answer:  Lab=Lab collect   Signed and Held   Signed and Held  Hepatitis B surface antibody,quantitative  (New Admission Hemo Labs (Hepatitis B))  Once,   R       Question:  Specimen collection method  Answer:  Lab=Lab collect   Signed and Held              Home Health: Yes Equipment/Devices: None  Discharge Condition: Stable CODE STATUS: Full code Diet recommendation:  Low-sodium  Subjective: Seen and examined.  She has no complaints.  Brief/Interim Summary: [name] is a 70 y.o. female with medical history significant for end-stage renal disease on hemodialysis on Monday, Wednesday, Friday schedule, anemia of chronic kidney disease, chronic diastolic heart failure, hypertension, hyperlipidemia, chronically elevated troponin, who was admitted to [facility] on 11/07/2021 with acute on chronic diastolic heart failure after presenting from home to [facility] ED complaining of shortness of breath. patient has missed 2 sessions of dialysis but she cannot confirm that.  Per nephro note, she did not miss any session and stated she was on holiday schedule.  Nonetheless, she received her routine hemodialysis here.  Congestive heart failure, acute exacerbation has resolved and she is on room air now.  She was assessed by PT OT, they recommended SNF.  Unfortunately, patient has utilized 20 days of SNF stay during this calendar year and now based on her insurance, she has to pay $185 per day which family is unable to afford so patient is going to be discharged home today per son's request.   History of prolonged QTC: QTC slightly shorter than her recent known QTC.     Anemia of chronic disease: Stable.   GERD: Continue PPI.  Hyperlipidemia: Continue statin.   Essential hypertension: Patient's blood pressure remained fluctuating here.  She is being discharged on her home dose of Cozaar.   Chronic anxiety: Resume home Xanax.  Discharge Diagnoses:  Principal Problem:   Acute on chronic diastolic CHF (congestive heart failure) (HCC) Active Problems:   End-stage renal disease on hemodialysis (HCC)   HTN (hypertension)   Hyperlipidemia   Prolonged Q-T interval on ECG   Volume overload   Acute respiratory failure with hypoxia (HCC)   GERD (gastroesophageal reflux disease)   Acute on chronic diastolic (congestive) heart failure ([place])    Discharge  Instructions   Allergies as of 11/11/2021       Reactions   Sulfa Antibiotics Other (See Comments)   Per patient, both parents allergic-so will not take   Adhesive [tape] Itching        Medication List     STOP taking these medications    busPIRone 10 MG tablet Commonly known as: BUSPAR       TAKE these medications    acetaminophen 500 MG tablet Commonly known as: TYLENOL Take 1 tablet (500 mg total) by mouth every 6 (six) hours as needed. What changed:  how much to take reasons to take this   albuterol 108 (90 Base) MCG/ACT inhaler Commonly known as: VENTOLIN HFA Inhale 1-2 puffs into the lungs every 6 (six) hours as needed for wheezing or shortness of breath.   allopurinol 300 MG tablet Commonly known as: ZYLOPRIM Take 300 mg by mouth daily.   ALPRAZolam 0.25 MG tablet Commonly known as: XANAX Take 1 tablet (0.25 mg total) by mouth daily as needed for anxiety.   atorvastatin 80 MG tablet Commonly known as: LIPITOR TAKE 1 TABLET (80 MG TOTAL) BY MOUTH DAILY.   cinacalcet 30 MG tablet Commonly known as: SENSIPAR Take 30 mg by mouth 3 (three) times a week. Mon, Wed, Fri-- Given @ Dialysis   folic acid-vitamin b complex-vitamin c-selenium-zinc 3 MG Tabs tablet Take 1 tablet by mouth daily.   losartan 50 MG tablet Commonly known as: COZAAR Take 50 mg by mouth daily.   ondansetron 8 MG tablet Commonly known as: ZOFRAN Take 8 mg by mouth 2 (two) times daily as needed for nausea or vomiting.   pantoprazole 40 MG tablet Commonly known as: PROTONIX Take 1 tablet (40 mg total) by mouth daily.   zolpidem 10 MG tablet Commonly known as: AMBIEN Take 10 mg by mouth at bedtime as needed for sleep.        Follow-up Information     [name], MD Follow up in 1 week(s).   Specialty: Internal Medicine Contact information: [address] [place] [address] [phone]         Burnell Blanks, MD .   Specialty:  Cardiology Contact information: [address] [place] [address] [phone]                Allergies  Allergen Reactions   Sulfa Antibiotics Other (See Comments)    Per patient, both parents allergic-so will not take   Adhesive [Tape] Itching    Consultations: Nephrology   Procedures/Studies: DG Chest 2 View  Result Date: 11/07/2021 CLINICAL DATA:  70 year old female with history of shortness of breath and weakness. EXAM: CHEST - 2 VIEW COMPARISON:  Chest x-ray 10/27/2021. FINDINGS: There is cephalization of the pulmonary vasculature and slight indistinctness of the interstitial markings suggestive of mild pulmonary edema. Trace bilateral pleural effusions.  No pneumothorax. Heart size is mildly enlarged. Upper mediastinal contours are remarkable for scratch the upper mediastinal contours are within normal limits. Atherosclerotic calcifications in the thoracic aorta. IMPRESSION: 1. Cardiomegaly with evidence of mild interstitial pulmonary edema and trace bilateral pleural effusions; imaging findings concerning for mild congestive heart failure. 2. Aortic atherosclerosis. Electronically Signed   By: Vinnie Langton M.D.   On: 11/07/2021 05:47   DG Chest 2 View  Result Date: 10/27/2021 CLINICAL DATA:  Shortness of breath EXAM: CHEST - 2 VIEW COMPARISON:  Chest radiograph 10/26/2021 FINDINGS: The heart is enlarged, unchanged. The mediastinum appears widened, similar to the study from 10/13/2021. There is calcified atherosclerotic plaque throughout the thoracic aorta, unchanged. There is no focal consolidation. There are small bilateral pleural effusions, slightly increased in size. Again seen is cephalization of the pulmonary vasculature suggesting mild pulmonary interstitial edema. There is no pneumothorax. The bones are stable, including a remote fracture of the left seventh rib. IMPRESSION: 1. Slightly increased size of small bilateral pleural effusions and  unchanged mild pulmonary interstitial edema. 2. Unchanged cardiomegaly and prominent mediastinum. Electronically Signed   By: Valetta Mole M.D.   On: 10/27/2021 08:02   DG Chest 2 View  Result Date: 10/26/2021 CLINICAL DATA:  70 year old female with history of shortness of breath since yesterday evening. EXAM: CHEST - 2 VIEW COMPARISON:  Chest x-ray 10/13/2021. FINDINGS: Lung volumes are normal. No consolidative airspace disease. Trace bilateral pleural effusions. No pneumothorax. There is cephalization of the pulmonary vasculature and slight indistinctness of the interstitial markings suggestive of mild pulmonary edema. Patchy areas of mild scarring are noted in the periphery of the upper lobes of the lungs bilaterally, similar to the prior study. Moderate cardiomegaly. The patient is rotated to the right on today's exam, resulting in distortion of the mediastinal contours and reduced diagnostic sensitivity and specificity for mediastinal pathology. Atherosclerotic calcifications are noted in the thoracic aorta. Old healed fracture of the lateral aspect of the left seventh rib incidentally noted. Orthopedic fixation hardware in the cervical spine incidentally noted. IMPRESSION: 1. The appearance the chest suggests congestive heart failure, as above. 2. Aortic atherosclerosis. Electronically Signed   By: Vinnie Langton M.D.   On: 10/26/2021 06:01   DG Chest 2 View  Result Date: 10/13/2021 CLINICAL DATA:  70 year old female with history of shortness of breath. EXAM: CHEST - 2 VIEW COMPARISON:  Chest x-ray 10/05/2021. FINDINGS: There is cephalization of the pulmonary vasculature and slight indistinctness of the interstitial markings suggestive of mild pulmonary edema. Small bilateral pleural effusions. No pneumothorax. Moderate cardiomegaly. The patient is rotated to the right on today's exam, resulting in distortion of the mediastinal contours and reduced diagnostic sensitivity and specificity for  mediastinal pathology. Atherosclerotic calcifications in the thoracic aorta. IMPRESSION: 1. The appearance the chest suggests mild congestive heart failure, as above. 2. Aortic atherosclerosis. Electronically Signed   By: Vinnie Langton M.D.   On: 10/13/2021 05:45   CT Head Wo Contrast  Result Date: 11/07/2021 CLINICAL DATA:  Mental status change EXAM: CT HEAD WITHOUT CONTRAST TECHNIQUE: Contiguous axial images were obtained from the base of the skull through the vertex without intravenous contrast. COMPARISON:  CT head 02/21/2021 FINDINGS: Brain: Moderate to advanced cortical atrophy most prominent in the frontal and temporal lobes. Mild ventricular enlargement consistent with atrophy. Moderate white matter hypodensity bilaterally. Negative for acute infarct, hemorrhage, mass Vascular: Atherosclerotic calcification. Negative for hyperdense vessel Skull: No focal skull lesion. Degenerative changes C1-2 with mild spinal stenosis Sinuses/Orbits: Mild  mucosal edema right maxillary sinus. Remaining sinuses clear. Bilateral cataract extraction. Other: None IMPRESSION: Atrophy and chronic microvascular ischemic change. No acute abnormality. Electronically Signed   By: Franchot Gallo M.D.   On: 11/07/2021 08:26   CT ABDOMEN PELVIS W CONTRAST  Result Date: 10/17/2021 CLINICAL DATA:  Abdominal pain EXAM: CT ABDOMEN AND PELVIS WITH CONTRAST TECHNIQUE: Multidetector CT imaging of the abdomen and pelvis was performed using the standard protocol following bolus administration of intravenous contrast. CONTRAST:  62mL OMNIPAQUE IOHEXOL 350 MG/ML SOLN COMPARISON:  CT abdomen and pelvis 08/10/2021 FINDINGS: Lower chest: Trace bilateral pleural effusions. Stable chronic appearing nodular densities in the bilateral lower lungs. Cardiomegaly and coronary artery disease. Reflux of contrast noted into the hepatic veins. Hepatobiliary: Liver is normal in size and contour with no suspicious mass identified. Gallbladder is  surgically absent. No biliary ductal dilatation appreciated. Pancreas: Unremarkable. No pancreatic ductal dilatation or surrounding inflammatory changes. Spleen: Normal in size without focal abnormality. Adrenals/Urinary Tract: Adrenal glands appears stable and within normal limits. Kidneys are markedly atrophic without definite hydronephrosis. 7 cm cyst of the left kidney. Urinary bladder is nondistended and not well evaluated. Stomach/Bowel: Small hiatal hernia. No evidence of bowel obstruction, free air or pneumatosis. Colonic diverticulosis. There appears to be wall thickening of the sigmoid colon which may be acute or chronic. Limited evaluation of bowel due to nondistention. No definite evidence of acute appendicitis, appendix not visualized. Vascular/Lymphatic: Severe atherosclerotic disease. 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 No bulky lymphadenopathy visualized. Reproductive: No suspicious adnexal mass visualized. Other: No frank ascites visualized. Stable amorphous density in the anterior right lower abdomen/pelvis. Musculoskeletal: Postsurgical changes and severe degenerative changes at L4-L5. Changes of renal osteodystrophy. IMPRESSION: 1. Colonic diverticulosis. Wall thickening of the sigmoid colon which appears similar to previous study may represent chronic or possibly mild acute on chronic diverticulitis. 2. Trace pleural effusions. 3. Numerous additional stable and chronic findings as described. Electronically Signed   By: Ofilia Neas M.D.   On: 10/17/2021 08:47   DG Chest Portable 1 View  Result Date: 11/07/2021 CLINICAL DATA:  Initial evaluation for volume overload, missed dialysis. EXAM: PORTABLE CHEST 1 VIEW COMPARISON:  Prior radiograph from earlier the same day. FINDINGS: Marked cardiomegaly again seen. Mediastinal silhouette stable. Extensive aortic atherosclerosis. Lungs mildly hypoinflated. Persistent diffuse pulmonary interstitial edema, relatively  similar as compared to prior radiograph performed earlier the same day. No visible pleural effusion on this single AP view of the chest. No new consolidative airspace disease. No pneumothorax. No new osseous abnormality. Remotely healed left-sided rib fracture noted. IMPRESSION: 1. Cardiomegaly with diffuse pulmonary interstitial edema, relatively similar as compared to prior radiograph performed earlier the same day. 2.  Aortic Atherosclerosis (ICD10-I70.0). Electronically Signed   By: Jeannine Boga M.D.   On: 11/07/2021 21:37     Discharge Exam: Vitals:   11/11/21 0747 11/11/21 1253  BP: (!) 89/59 124/73  Pulse: 84 97  Resp: 14 16  Temp: 97.8 F (36.6 C) 97.6 F (36.4 C)  SpO2: 96% 98%   Vitals:   11/11/21 0306 11/11/21 0440 11/11/21 0747 11/11/21 1253  BP: (!) 89/51 (!) 92/46 (!) 89/59 124/73  Pulse:   84 97  Resp: 16 20 14 16   Temp: (!) 97.3 F (36.3 C)  97.8 F (36.6 C) 97.6 F (36.4 C)  TempSrc: Oral  Oral Oral  SpO2: 93%  96% 98%  Weight: 48.5 kg     Height:  General: Pt is alert, awake, not in acute distress Cardiovascular: RRR, S1/S2 +, no rubs, no gallops Respiratory: CTA bilaterally, no wheezing, no rhonchi Abdominal: Soft, NT, ND, bowel sounds + Extremities: no edema, no cyanosis    The results of significant diagnostics from this hospitalization (including imaging, microbiology, ancillary and laboratory) are listed below for reference.     Microbiology: Recent Results (from the past 240 hour(s))  Resp Panel by RT-PCR (Flu A&B, Covid) Nasopharyngeal Swab     Status: None   Collection Time: 11/07/21  8:21 PM   Specimen: Nasopharyngeal Swab; Nasopharyngeal(NP) swabs in vial transport medium  Result Value Ref Range Status   SARS Coronavirus 2 by RT PCR NEGATIVE NEGATIVE Final    Comment: (NOTE) SARS-CoV-2 target nucleic acids are NOT DETECTED.  The SARS-CoV-2 RNA is generally detectable in upper respiratory specimens during the acute phase of  infection. The lowest concentration of SARS-CoV-2 viral copies this assay can detect is 138 copies/mL. A negative result does not preclude SARS-Cov-2 infection and should not be used as the sole basis for treatment or other patient management decisions. A negative result may occur with  improper specimen collection/handling, submission of specimen other than nasopharyngeal swab, presence of viral mutation(s) within the areas targeted by this assay, and inadequate number of viral copies(<138 copies/mL). A negative result must be combined with clinical observations, patient history, and epidemiological information. The expected result is Negative.  Fact Sheet for Patients:  EntrepreneurPulse.com.au  Fact Sheet for Healthcare Providers:  IncredibleEmployment.be  This test is no t yet approved or cleared by the [place] FDA and  has been authorized for detection and/or diagnosis of SARS-CoV-2 by FDA under an Emergency Use Authorization (EUA). This EUA will remain  in effect (meaning this test can be used) for the duration of the COVID-19 declaration under Section 564(b)(1) of the Act, 21 U.S.C.section 360bbb-3(b)(1), unless the authorization is terminated  or revoked sooner.       Influenza A by PCR NEGATIVE NEGATIVE Final   Influenza B by PCR NEGATIVE NEGATIVE Final    Comment: (NOTE) The Xpert Xpress SARS-CoV-2/FLU/RSV plus assay is intended as an aid in the diagnosis of influenza from Nasopharyngeal swab specimens and should not be used as a sole basis for treatment. Nasal washings and aspirates are unacceptable for Xpert Xpress SARS-CoV-2/FLU/RSV testing.  Fact Sheet for Patients: EntrepreneurPulse.com.au  Fact Sheet for Healthcare Providers: IncredibleEmployment.be  This test is not yet approved or cleared by the [place] FDA and has been authorized for detection and/or diagnosis of SARS-CoV-2  by FDA under an Emergency Use Authorization (EUA). This EUA will remain in effect (meaning this test can be used) for the duration of the COVID-19 declaration under Section 564(b)(1) of the Act, 21 U.S.C. section 360bbb-3(b)(1), unless the authorization is terminated or revoked.  Performed at [facility], [place] [address] [place],  [place] [address]      Labs: BNP (last 3 results) Recent Labs    10/13/21 0452 10/27/21 2120 11/08/21 0042  BNP 3,948.4* 3,837.3* >9,702.6*   Basic Metabolic Panel: Recent Labs  Lab 11/07/21 0927 11/07/21 2033 11/07/21 2234 11/08/21 0042  NA 138 139  --  138  K 3.9 4.6  --  4.4  CL 99 96*  --  97*  CO2  --  25  --  26  GLUCOSE 89 111*  --  106*  BUN 36* 50*  --  53*  CREATININE 5.50* 5.97*  --  6.14*  CALCIUM  --  7.7*  --  7.6*  MG  --   --  1.7 2.2  PHOS  --   --   --  3.9   Liver Function Tests: Recent Labs  Lab 11/07/21 2033 11/08/21 0042  AST 37 32  ALT 16 15  ALKPHOS 195* 196*  BILITOT 1.2 0.8  PROT 7.1 6.9  ALBUMIN 2.7* 2.7*   No results for input(s): LIPASE, AMYLASE in the last 168 hours. No results for input(s): AMMONIA in the last 168 hours. CBC: Recent Labs  Lab 11/07/21 0852 11/07/21 0927 11/07/21 2033 11/08/21 0042  WBC 4.9  --  6.6 6.4  NEUTROABS 2.8  --  4.4  --   HGB 9.8* 10.9* 9.7* 10.1*  HCT 31.4* 32.0* 31.4* 32.0*  MCV 96.9  --  96.6 95.8  PLT 266  --  280 275   Cardiac Enzymes: No results for input(s): CKTOTAL, CKMB, CKMBINDEX, TROPONINI in the last 168 hours. BNP: Invalid input(s): POCBNP CBG: No results for input(s): GLUCAP in the last 168 hours. D-Dimer No results for input(s): DDIMER in the last 72 hours. Hgb A1c No results for input(s): HGBA1C in the last 72 hours. Lipid Profile No results for input(s): CHOL, HDL, LDLCALC, TRIG, CHOLHDL, LDLDIRECT in the last 72 hours. Thyroid function studies No results for input(s): TSH, T4TOTAL, T3FREE, THYROIDAB in the last 72  hours.  Invalid input(s): FREET3 Anemia work up No results for input(s): VITAMINB12, FOLATE, FERRITIN, TIBC, IRON, RETICCTPCT in the last 72 hours. Urinalysis    Component Value Date/Time   COLORURINE YELLOW 01/30/2011 1648   APPEARANCEUR CLOUDY (A) 01/30/2011 1648   LABSPEC 1.016 01/30/2011 1648   PHURINE 5.0 01/30/2011 1648   GLUCOSEU NEGATIVE 10/14/2009 0747   HGBUR SMALL (A) 01/30/2011 1648   BILIRUBINUR NEGATIVE 01/30/2011 1648   KETONESUR NEGATIVE 01/30/2011 1648   PROTEINUR 100 (A) 01/30/2011 1648   UROBILINOGEN 0.2 01/30/2011 1648   NITRITE NEGATIVE 01/30/2011 1648   LEUKOCYTESUR SMALL (A) 01/30/2011 1648   Sepsis Labs Invalid input(s): PROCALCITONIN,  WBC,  LACTICIDVEN Microbiology Recent Results (from the past 240 hour(s))  Resp Panel by RT-PCR (Flu A&B, Covid) Nasopharyngeal Swab     Status: None   Collection Time: 11/07/21  8:21 PM   Specimen: Nasopharyngeal Swab; Nasopharyngeal(NP) swabs in vial transport medium  Result Value Ref Range Status   SARS Coronavirus 2 by RT PCR NEGATIVE NEGATIVE Final    Comment: (NOTE) SARS-CoV-2 target nucleic acids are NOT DETECTED.  The SARS-CoV-2 RNA is generally detectable in upper respiratory specimens during the acute phase of infection. The lowest concentration of SARS-CoV-2 viral copies this assay can detect is 138 copies/mL. A negative result does not preclude SARS-Cov-2 infection and should not be used as the sole basis for treatment or other patient management decisions. A negative result may occur with  improper specimen collection/handling, submission of specimen other than nasopharyngeal swab, presence of viral mutation(s) within the areas targeted by this assay, and inadequate number of viral copies(<138 copies/mL). A negative result must be combined with clinical observations, patient history, and epidemiological information. The expected result is Negative.  Fact Sheet for Patients:   EntrepreneurPulse.com.au  Fact Sheet for Healthcare Providers:  IncredibleEmployment.be  This test is no t yet approved or cleared by the [place] FDA and  has been authorized for detection and/or diagnosis of SARS-CoV-2 by FDA under an Emergency Use Authorization (EUA). This EUA will remain  in effect (meaning this test can be used) for the duration of the  COVID-19 declaration under Section 564(b)(1) of the Act, 21 U.S.C.section 360bbb-3(b)(1), unless the authorization is terminated  or revoked sooner.       Influenza A by PCR NEGATIVE NEGATIVE Final   Influenza B by PCR NEGATIVE NEGATIVE Final    Comment: (NOTE) The Xpert Xpress SARS-CoV-2/FLU/RSV plus assay is intended as an aid in the diagnosis of influenza from Nasopharyngeal swab specimens and should not be used as a sole basis for treatment. Nasal washings and aspirates are unacceptable for Xpert Xpress SARS-CoV-2/FLU/RSV testing.  Fact Sheet for Patients: EntrepreneurPulse.com.au  Fact Sheet for Healthcare Providers: IncredibleEmployment.be  This test is not yet approved or cleared by the [place] FDA and has been authorized for detection and/or diagnosis of SARS-CoV-2 by FDA under an Emergency Use Authorization (EUA). This EUA will remain in effect (meaning this test can be used) for the duration of the COVID-19 declaration under Section 564(b)(1) of the Act, 21 U.S.C. section 360bbb-3(b)(1), unless the authorization is terminated or revoked.  Performed at [facility], [place] [address] [place], [place] [address]      Time coordinating discharge: Over 30 minutes  SIGNED:   Darliss Cheney, MD  [facility] 11/11/2021, 2:59 PM  If 7PM-7AM, please contact night-coverage [email or web address]

## 2021-11-11 NOTE — Progress Notes (Signed)
Physical Therapy Treatment Patient Details Name: [name] MRN: [id number] DOB: 04/06/1951 Today's Date: 11/11/2021   History of Present Illness Pt is a 70 y.o. female who presented 11/07/21 with SOB and acute on chronic diastolic heart failure. Per chart, she is s/p a brief hosp earlier this month for the same thing-  this is her 4th trip to the ER this month. PMH:  end-stage renal disease on HD MWF, anemia of chronic kidney disease, chronic diastolic heart failure, hypertension, hyperlipidemia, chronically elevated troponin    PT Comments    Pt progressing with mobility. This session focused on transfers and gait training. Pt limited by poor balance, generalized weakness, decreased activity tolerance, and decreased safety awareness. Pt was motivated to participate in PT today. Continue to recommend SNF after discharge to increase functional mobility and ensure a safe return home, as she does not have family available to help consistently. If pt were to return home, she would need frequent physical assist for bed mobility, transfers, and ambulation. Will continue to follow acutely to address short-term PT goals.    Recommendations for follow up therapy are one component of a multi-disciplinary discharge planning process, led by the attending physician.  Recommendations may be updated based on patient status, additional functional criteria and insurance authorization.  Follow Up Recommendations  Skilled nursing-short term rehab (<3 hours/day)     Assistance Recommended at Discharge Frequent or constant Supervision/Assistance  Equipment Recommendations  BSC/3in1  Recommendations for Other Services       Precautions / Restrictions Precautions Precautions: Fall;Other (comment) Precaution Comments: monitor BP Restrictions Weight Bearing Restrictions: No     Mobility  Bed Mobility Overal bed mobility: Needs Assistance Bed Mobility: Supine to Sit     Supine to sit: HOB  elevated;Min assist     General bed mobility comments: min A for trunk elevation; able to use rails to assist    Transfers Overall transfer level: Needs assistance   Transfers: Sit to/from Stand Sit to Stand: Min assist           General transfer comment: 3 sit to stands from EOB; min A for elevation and stability; max cues needed for eccentric control and to prevent sitting prematurely    Ambulation/Gait Ambulation/Gait assistance: Min assist Gait Distance (Feet): 28 Feet (+28, +20) Assistive device: Rolling walker (2 wheels) Gait Pattern/deviations: Decreased stride length;Step-through pattern Gait velocity: decreased     General Gait Details: ambulated in room with 2 rest breaks due to fatigue; pt having difficulties with RW management requiring max cues to stay inside walker; min A for stability, pt had several LOB requring assist to recover   Stairs             Wheelchair Mobility    Modified Rankin (Stroke Patients Only)       Balance Overall balance assessment: Needs assistance Sitting-balance support: Bilateral upper extremity supported;Feet supported Sitting balance-Leahy Scale: Fair Sitting balance - Comments: Pt able to sit EOB without physical assistance   Standing balance support: Bilateral upper extremity supported Standing balance-Leahy Scale: Poor Standing balance comment: BUE support and physical assistance needed for balance during functional activity                            Cognition Arousal/Alertness: Awake/alert Behavior During Therapy: WFL for tasks assessed/performed Overall Cognitive Status: No family/caregiver present to determine baseline cognitive functioning Area of Impairment: Following commands;Safety/judgement;Problem solving  Following Commands: Follows one step commands with increased time Safety/Judgement: Decreased awareness of safety   Problem Solving: Slow  processing;Difficulty sequencing;Requires verbal cues General Comments: max cues needed for RW management and to prevent sitting prematurely        Exercises      General Comments General comments (skin integrity, edema, etc.): Pre-therapy BP 110/34, Post-therapy BP 111/79, BP cuff on left LE; pt reported no dizziness      Pertinent Vitals/Pain Pain Assessment: No/denies pain    Home Living                          Prior Function            PT Goals (current goals can now be found in the care plan section) Acute Rehab PT Goals Patient Stated Goal: to get further rehab before returning home PT Goal Formulation: With patient Time For Goal Achievement: 11/23/21 Potential to Achieve Goals: Fair Progress towards PT goals: Progressing toward goals    Frequency    Min 3X/week      PT Plan Frequency needs to be updated    Co-evaluation              AM-PAC PT "6 Clicks" Mobility   Outcome Measure  Help needed turning from your back to your side while in a flat bed without using bedrails?: A Little Help needed moving from lying on your back to sitting on the side of a flat bed without using bedrails?: A Little Help needed moving to and from a bed to a chair (including a wheelchair)?: A Little Help needed standing up from a chair using your arms (e.g., wheelchair or bedside chair)?: A Little Help needed to walk in hospital room?: A Little Help needed climbing 3-5 steps with a railing? : A Lot 6 Click Score: 17    End of Session Equipment Utilized During Treatment: Gait belt Activity Tolerance: Patient tolerated treatment well Patient left: in chair;with chair alarm set;with call bell/phone within reach Nurse Communication: Mobility status PT Visit Diagnosis: Unsteadiness on feet (R26.81);Other abnormalities of gait and mobility (R26.89);Muscle weakness (generalized) (M62.81);Difficulty in walking, not elsewhere classified (R26.2)     Time:  1003-1030 PT Time Calculation (min) (ACUTE ONLY): 27 min  Charges:  $Gait Training: 8-22 mins $Therapeutic Activity: 8-22 mins                     Brandon Melnick, SPT    Brandon Melnick 11/11/2021, 3:30 PM

## 2021-11-11 NOTE — Progress Notes (Signed)
   11/11/21 0306  Vitals  Temp (!) 97.3 F (36.3 C)  Temp Source Oral  BP (!) 89/51  MAP (mmHg) (!) 64  BP Location Left Leg  BP Method Automatic  Patient Position (if appropriate) Lying  Pulse Rate Source Monitor  ECG Heart Rate 77  Resp 16  MEWS COLOR  MEWS Score Color Green  Oxygen Therapy  SpO2 93 %  O2 Device Room Air  MEWS Score  MEWS Temp 0  MEWS Systolic 1  MEWS Pulse 0  MEWS RR 0  MEWS LOC 0  MEWS Score 1   BPs soft. Upon pt assessment, pt is drowsy but easily arousable, and she has no complaints when asked. Skin warm and dry. Only available area for BPs is in lower legs as both arms with fistulas present, unsure of accuracy. HR NSR 70s. Will continue to closely monitor.

## 2021-11-11 NOTE — Progress Notes (Signed)
Advised by CSW that pt to d/c to home today. Pt receives out-pt HD at [facility] on MWF. Contacted clinic to make them aware of pt's dc today and pt to resume care tomorrow.   Melven Sartorius Renal Navigator [phone]

## 2021-11-11 NOTE — Progress Notes (Signed)
Patient given discharge instructions. Sister present.  Telemetry box removed, CCMD notified. Patient taken by wheelchair to vehicle by staff.  Daymon Larsen, RN

## 2021-11-11 NOTE — TOC Transition Note (Signed)
Transition of Care (TOC) - CM/SW Discharge Note Marvetta Gibbons RN, BSN Transitions of Care Unit 4E- RN Case Manager See Treatment Team for direct phone #    Patient Details  Name: FLORENDA WATT MRN: [id number] Date of Birth: [birth year]  Transition of Care [facility]) CM/SW Contact:  Dawayne Patricia, RN Phone Number: 11/11/2021, 3:50 PM   Clinical Narrative:    Per CSW pt and son have decided to return home w/ [facility] and not go to SNF as pt is in her copay days. Orders for HHPT/OT have been placed- pt stable for transition home today.  Call made to son - Quillian Quince, to discuss [place] needs- per son pt was active with [facility] prior to admit- would like to continue [facility] services with [place]. Quillian Quince also reports that patient has all needed DME at home- including wheelchair, RW and [facility]- no further DME needs noted at this time.   Per son he lives an hour away and states he can come to transport mom home if needed- no one else closer that can assist home. There is someone at the home that can let pt in the home. Will assist pt home with [facility] transport- per bedside RN pt able to get in/out of a car.   Call made to [facility] with [place]- confirmed pt is active with them for HHPT/OT/aide- [facility] will resume services on discharge.   Unit secretary to arrange transport with bedside RN to home.    Final next level of care: [place] Barriers to Discharge: Barriers Resolved   Patient Goals and CMS Choice Patient states their goals for this hospitalization and ongoing recovery are:: return home CMS Medicare.gov Compare Post Acute Care list provided to:: Patient Represenative (must comment) (son- Quillian Quince) Choice offered to / list presented to : Adult Children  Discharge Placement               Home w/ [facility]        Discharge Plan and Services In-house Referral: Clinical Social Work Discharge Planning Services: CM Consult Post Acute Care Choice: Home Health, [place]          DME Arranged: N/A DME Agency: NA       HH Arranged: PT, OT, Nurse's Aide [place] Agency: [place] Date [place]: 11/11/21 Time [place]: 0160 Representative spoke with at [place]: [place] ([place]) Interventions     Readmission Risk Interventions Readmission Risk Prevention Plan 11/11/2021 08/18/2021  Transportation Screening Complete Complete  Medication Review Press photographer) Complete Complete  PCP or Specialist appointment within 3-5 days of discharge Complete Complete  HRI or Home Care Consult Complete Complete  SW Recovery Care/Counseling Consult Complete Complete  Palliative Care Screening Not Applicable Not [place] Complete Complete  Some recent data might be hidden

## 2021-11-11 NOTE — Progress Notes (Signed)
PROGRESS NOTE    [name]  ELF:[id number] DOB: [birth year] DOA: 11/07/2021 PCP: [name], MD   Brief Narrative:  [name] is a 70 y.o. female with medical history significant for end-stage renal disease on hemodialysis on Monday, Wednesday, Friday schedule, anemia of chronic kidney disease, chronic diastolic heart failure, hypertension, hyperlipidemia, chronically elevated troponin, who is admitted to [facility] on 11/07/2021 with acute on chronic diastolic heart failure after presenting from home to [facility] ED complaining of shortness of breath.   Assessment & Plan:   Principal Problem:   Acute on chronic diastolic CHF (congestive heart failure) (HCC) Active Problems:   End-stage renal disease on hemodialysis (HCC)   SOB (shortness of breath)   HTN (hypertension)   Hyperlipidemia   Prolonged Q-T interval on ECG   Volume overload   Acute respiratory failure with hypoxia (HCC)   GERD (gastroesophageal reflux disease)   Acute on chronic diastolic (congestive) heart failure (HCC)  Acute hypoxic respiratory failure secondary to acute on chronic congestive heart failure with preserved ejection fraction/ESRD on HD: Per chart, patient has missed 2 sessions of dialysis but she cannot confirm that.  Per nephro note, she did not miss any session and stated she was on holiday schedule.  Nonetheless, she has been receiving her do dialysis sessions and she is feeling better now.  She is on dry weight according to nephrology.  She is medically ready for discharge, awaiting placement at SNF.  History of prolonged QTC: QTC slightly shorter than her recent known QTC.  Continue to avoid QT prolonging medications and keep magnesium over 2 and potassium over 4.  Anemia of chronic disease: Stable.  GERD: Continue PPI.  Hyperlipidemia: Continue statin.  Essential hypertension: Patient's blood pressure has been very fluctuating, initially she was low, losartan was held.  She  was having high blood pressure yesterday, losartan was resumed.  She is low again, discontinue losartan again.  Monitor closely.  Chronic anxiety: Continue as needed Xanax that she takes at home.  DVT prophylaxis: heparin injection 5,000 Units Start: 11/09/21 1415 SCDs Start: 11/09/21 1321, will start on heparin.   Code Status: Full Code  Family Communication:  None present at bedside.    Status is: Inpatient  Remains inpatient appropriate because: Patient now medically stable, waiting for TOC to arrange SNF bed placement.  Estimated body mass index is 19.56 kg/m as calculated from the following:   Height as of this encounter: 5\' 2"  (1.575 m).   Weight as of this encounter: 48.5 kg.  Pressure Injury 10/27/21 Coccyx Mid Stage 1 -  Intact skin with non-blanchable redness of a localized area usually over a bony prominence. (Active)  10/27/21 2200  Location: Coccyx  Location Orientation: Mid  Staging: Stage 1 -  Intact skin with non-blanchable redness of a localized area usually over a bony prominence.  Wound Description (Comments):   Present on Admission: Yes    Nutritional Assessment: Body mass index is 19.56 kg/m.Marland Kitchen Seen by dietician.  I agree with the assessment and plan as outlined below: Nutrition Status:  Skin Assessment: I have examined the patient's skin and I agree with the wound assessment as performed by the wound care RN as outlined below: Pressure Injury 10/27/21 Coccyx Mid Stage 1 -  Intact skin with non-blanchable redness of a localized area usually over a bony prominence. (Active)  10/27/21 2200  Location: Coccyx  Location Orientation: Mid  Staging: Stage 1 -  Intact skin with non-blanchable redness of a  localized area usually over a bony prominence.  Wound Description (Comments):   Present on Admission: Yes    Consultants:  Nephrology  Procedures:  None  Antimicrobials:  Anti-infectives (From admission, onward)    None           Subjective: Patient seen and examined.  She has no complaints.    Objective: Vitals:   11/11/21 0306 11/11/21 0440 11/11/21 0747 11/11/21 1253  BP: (!) 89/51 (!) 92/46 (!) 89/59 124/73  Pulse:   84 97  Resp: 16 20 14 16   Temp: (!) 97.3 F (36.3 C)  97.8 F (36.6 C) 97.6 F (36.4 C)  TempSrc: Oral  Oral Oral  SpO2: 93%  96% 98%  Weight: 48.5 kg     Height:        Intake/Output Summary (Last 24 hours) at 11/11/2021 1331 Last data filed at 11/11/2021 1253 Gross per 24 hour  Intake 420 ml  Output --  Net 420 ml    Filed Weights   11/10/21 0951 11/10/21 1314 11/11/21 0306  Weight: 48.7 kg 47 kg 48.5 kg    Examination:  General exam: Appears calm and comfortable  Respiratory system: Clear to auscultation. Respiratory effort normal. Cardiovascular system: S1 & S2 heard, RRR. No JVD, murmurs, rubs, gallops or clicks. No pedal edema. Gastrointestinal system: Abdomen is nondistended, soft and nontender. No organomegaly or masses felt. Normal bowel sounds heard. Central nervous system: Alert and oriented. No focal neurological deficits. Extremities: Symmetric 5 x 5 power. Skin: No rashes, lesions or ulcers.  Psychiatry: Judgement and insight appear normal. Mood & affect appropriate.   Data Reviewed: I have personally reviewed following labs and imaging studies  CBC: Recent Labs  Lab 11/07/21 0852 11/07/21 0927 11/07/21 2033 11/08/21 0042  WBC 4.9  --  6.6 6.4  NEUTROABS 2.8  --  4.4  --   HGB 9.8* 10.9* 9.7* 10.1*  HCT 31.4* 32.0* 31.4* 32.0*  MCV 96.9  --  96.6 95.8  PLT 266  --  280 106    Basic Metabolic Panel: Recent Labs  Lab 11/07/21 0927 11/07/21 2033 11/07/21 2234 11/08/21 0042  NA 138 139  --  138  K 3.9 4.6  --  4.4  CL 99 96*  --  97*  CO2  --  25  --  26  GLUCOSE 89 111*  --  106*  BUN 36* 50*  --  53*  CREATININE 5.50* 5.97*  --  6.14*  CALCIUM  --  7.7*  --  7.6*  MG  --   --  1.7 2.2  PHOS  --   --   --  3.9    GFR: Estimated  Creatinine Clearance: 6.4 mL/min (A) (by C-G formula based on SCr of 6.14 mg/dL (H)). Liver Function Tests: Recent Labs  Lab 11/07/21 2033 11/08/21 0042  AST 37 32  ALT 16 15  ALKPHOS 195* 196*  BILITOT 1.2 0.8  PROT 7.1 6.9  ALBUMIN 2.7* 2.7*    No results for input(s): LIPASE, AMYLASE in the last 168 hours. No results for input(s): AMMONIA in the last 168 hours. Coagulation Profile: No results for input(s): INR, PROTIME in the last 168 hours. Cardiac Enzymes: No results for input(s): CKTOTAL, CKMB, CKMBINDEX, TROPONINI in the last 168 hours. BNP (last 3 results) No results for input(s): PROBNP in the last 8760 hours. HbA1C: No results for input(s): HGBA1C in the last 72 hours. CBG: No results for input(s): GLUCAP in the last 168 hours.  Lipid Profile: No results for input(s): CHOL, HDL, LDLCALC, TRIG, CHOLHDL, LDLDIRECT in the last 72 hours. Thyroid Function Tests: No results for input(s): TSH, T4TOTAL, FREET4, T3FREE, THYROIDAB in the last 72 hours. Anemia Panel: No results for input(s): VITAMINB12, FOLATE, FERRITIN, TIBC, IRON, RETICCTPCT in the last 72 hours. Sepsis Labs: Recent Labs  Lab 11/09/21 0855  LATICACIDVEN 2.4*     Recent Results (from the past 240 hour(s))  Resp Panel by RT-PCR (Flu A&B, Covid) Nasopharyngeal Swab     Status: None   Collection Time: 11/07/21  8:21 PM   Specimen: Nasopharyngeal Swab; Nasopharyngeal(NP) swabs in vial transport medium  Result Value Ref Range Status   SARS Coronavirus 2 by RT PCR NEGATIVE NEGATIVE Final    Comment: (NOTE) SARS-CoV-2 target nucleic acids are NOT DETECTED.  The SARS-CoV-2 RNA is generally detectable in upper respiratory specimens during the acute phase of infection. The lowest concentration of SARS-CoV-2 viral copies this assay can detect is 138 copies/mL. A negative result does not preclude SARS-Cov-2 infection and should not be used as the sole basis for treatment or other patient management decisions.  A negative result may occur with  improper specimen collection/handling, submission of specimen other than nasopharyngeal swab, presence of viral mutation(s) within the areas targeted by this assay, and inadequate number of viral copies(<138 copies/mL). A negative result must be combined with clinical observations, patient history, and epidemiological information. The expected result is Negative.  Fact Sheet for Patients:  EntrepreneurPulse.com.au  Fact Sheet for Healthcare Providers:  IncredibleEmployment.be  This test is no t yet approved or cleared by the [place] FDA and  has been authorized for detection and/or diagnosis of SARS-CoV-2 by FDA under an Emergency Use Authorization (EUA). This EUA will remain  in effect (meaning this test can be used) for the duration of the COVID-19 declaration under Section 564(b)(1) of the Act, 21 U.S.C.section 360bbb-3(b)(1), unless the authorization is terminated  or revoked sooner.       Influenza A by PCR NEGATIVE NEGATIVE Final   Influenza B by PCR NEGATIVE NEGATIVE Final    Comment: (NOTE) The Xpert Xpress SARS-CoV-2/FLU/RSV plus assay is intended as an aid in the diagnosis of influenza from Nasopharyngeal swab specimens and should not be used as a sole basis for treatment. Nasal washings and aspirates are unacceptable for Xpert Xpress SARS-CoV-2/FLU/RSV testing.  Fact Sheet for Patients: EntrepreneurPulse.com.au  Fact Sheet for Healthcare Providers: IncredibleEmployment.be  This test is not yet approved or cleared by the [place] FDA and has been authorized for detection and/or diagnosis of SARS-CoV-2 by FDA under an Emergency Use Authorization (EUA). This EUA will remain in effect (meaning this test can be used) for the duration of the COVID-19 declaration under Section 564(b)(1) of the Act, 21 U.S.C. section 360bbb-3(b)(1), unless the authorization  is terminated or revoked.  Performed at [facility], [place] [address], [place],  [address]        Radiology Studies: No results found.  Scheduled Meds:  (feeding supplement) PROSource Plus  30 mL Oral BID BM   atorvastatin  80 mg Oral QHS   Chlorhexidine Gluconate Cloth  6 each Topical Q0600   cinacalcet  30 mg Oral Q M,W,F-HD   heparin  5,000 Units Subcutaneous Q8H   mouth rinse  15 mL Mouth Rinse BID   pantoprazole  40 mg Oral Daily   Continuous Infusions:    LOS: 3 days   Time spent: 25 minutes   Darliss Cheney, MD [facility]  11/11/2021, 1:31 PM  Please page via [place] and do not message via secure chat for anything urgent. Secure chat can be used for anything non urgent.  How to contact the [facility] Attending or Consulting provider [place] or covering provider during after hours [place], for this patient?  Check the care team in [facility] and look for a) attending/consulting TRH provider listed and b) the [facility] team listed. Page or secure chat [phone]. Log into [email or web address] and use [place] universal password to access. If you do not have the password, please contact the hospital operator. Locate the [facility] provider you are looking for under [facility] and page to a number that you can be directly reached. If you still have difficulty reaching the provider, please page the [facility] (Director on Call) for the Hospitalists listed on amion for assistance.

## 2021-11-11 NOTE — TOC Progression Note (Signed)
Transition of Care [facility]) - Progression Note    Patient Details  Name: LIAM BOSSMAN MRN: [id number] Date of Birth: [birth year]  Transition of Care [facility]) CM/SW [place], [place] Phone Number: 11/11/2021, 2:45 PM  Clinical Narrative:     CSW spoke with patient's son,Daniel- CSW advised [facility] was willing to work with patient/family and make arrangements for co-payments- family declined offer. Patient's son states preference is for patient to d/c home. He or another family member will pick up patient today if medically stable.  Updated MD and Dialysis Coordinator - patient's family wants her d/c home.   Thurmond Butts, MSW, LCSW Clinical Social Worker     Expected Discharge Plan: Skilled Nursing Facility Barriers to Discharge: Ship broker, SNF Pending bed offer (family unable to make co-pay payments for SNF)  Expected Discharge Plan and Services Expected Discharge Plan: [place] In-house Referral: Clinical Social Work     Living arrangements for the past 2 months: Single Family Home                                       Social Determinants of Health (SDOH) Interventions    Readmission Risk Interventions Readmission Risk Prevention Plan 08/18/2021  Transportation Screening Complete  Medication Review Press photographer) Complete  PCP or Specialist appointment within 3-5 days of discharge Complete  HRI or Home Care Consult Complete  SW Recovery Care/Counseling Consult Complete  [place] Complete  Some recent data might be hidden

## 2021-11-12 DIAGNOSIS — T8612 Kidney transplant failure: Secondary | ICD-10-CM | POA: Diagnosis not present

## 2021-11-12 DIAGNOSIS — Z992 Dependence on renal dialysis: Secondary | ICD-10-CM | POA: Diagnosis not present

## 2021-11-12 DIAGNOSIS — N186 End stage renal disease: Secondary | ICD-10-CM | POA: Diagnosis not present

## 2021-11-12 DIAGNOSIS — N2581 Secondary hyperparathyroidism of renal origin: Secondary | ICD-10-CM | POA: Diagnosis not present

## 2021-11-13 IMAGING — DX DG CHEST 2V
2 series · 2 of 2 positions shown · non-contrast
Comparison: Prior today

CLINICAL DATA: Chest pain and dyspnea beginning this morning.

EXAM:
CHEST - 2 VIEW

[chest lat]
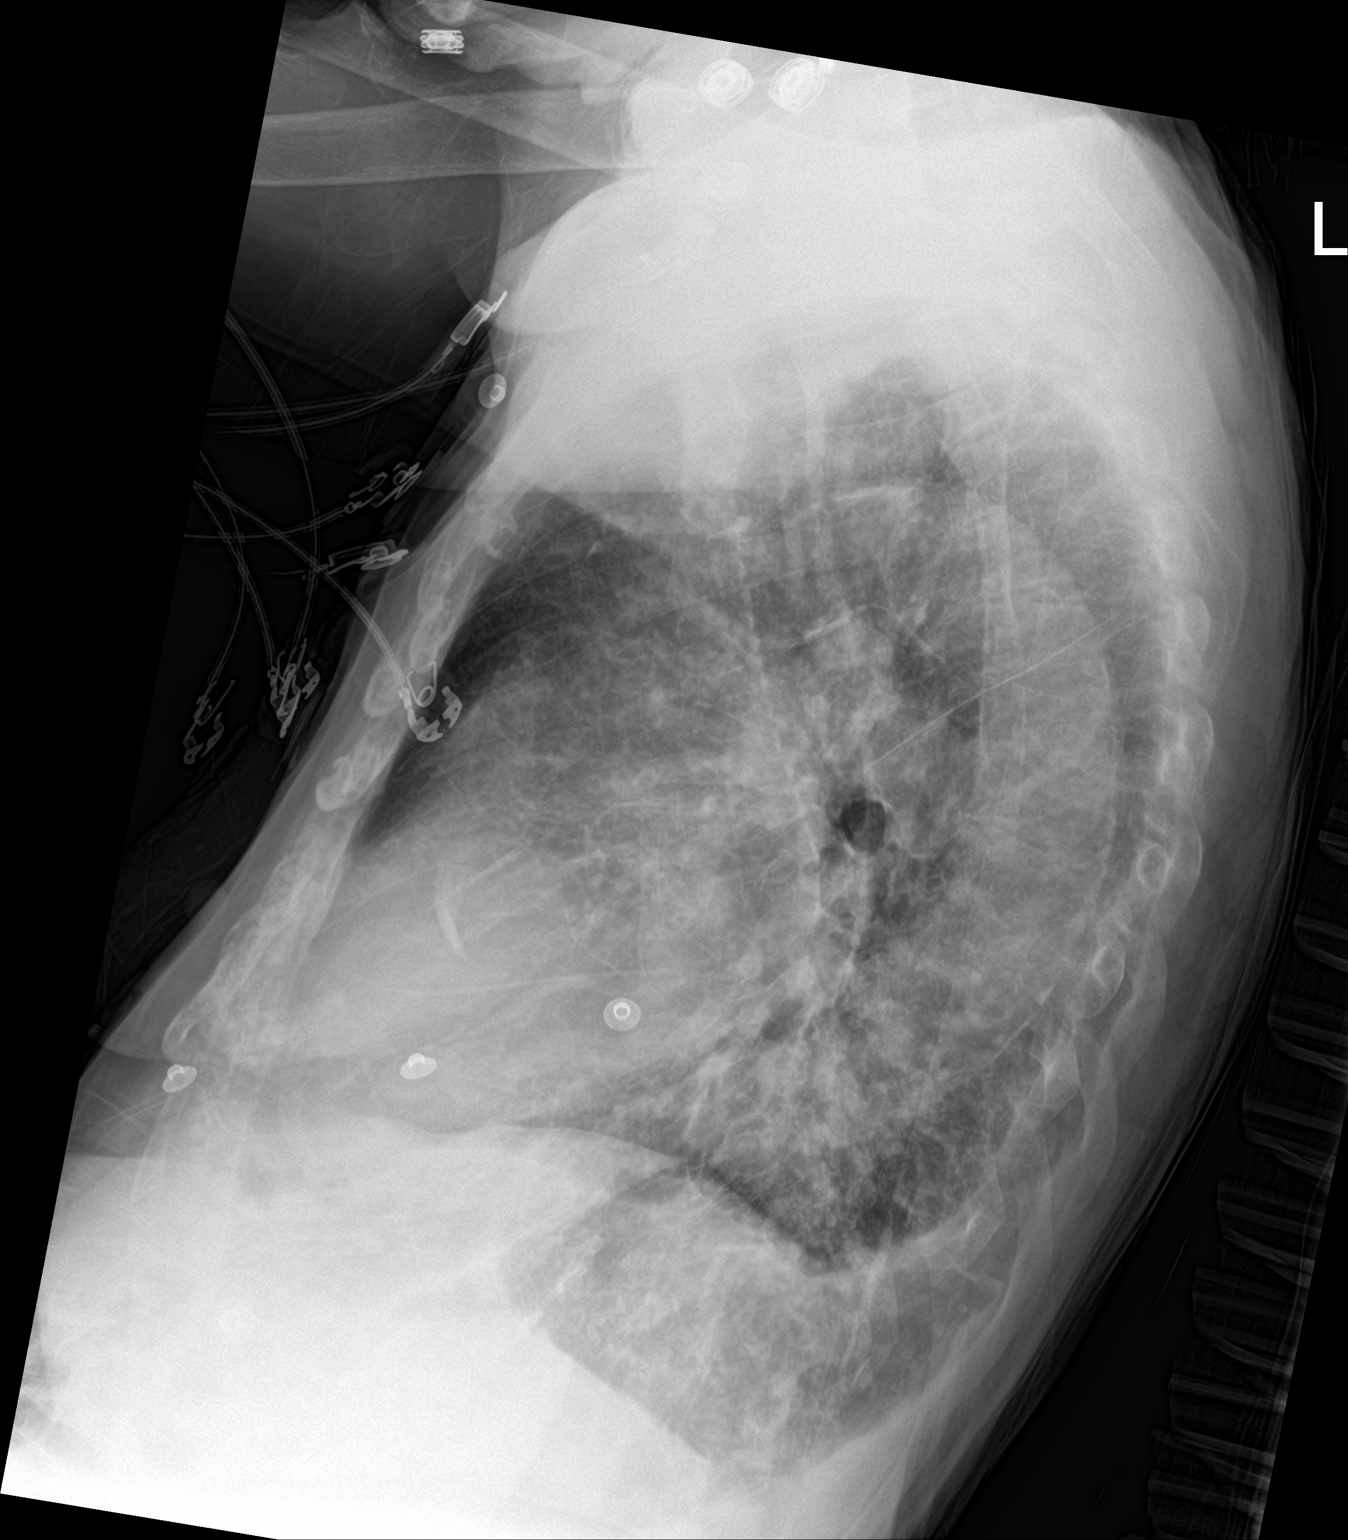

[chest ap]
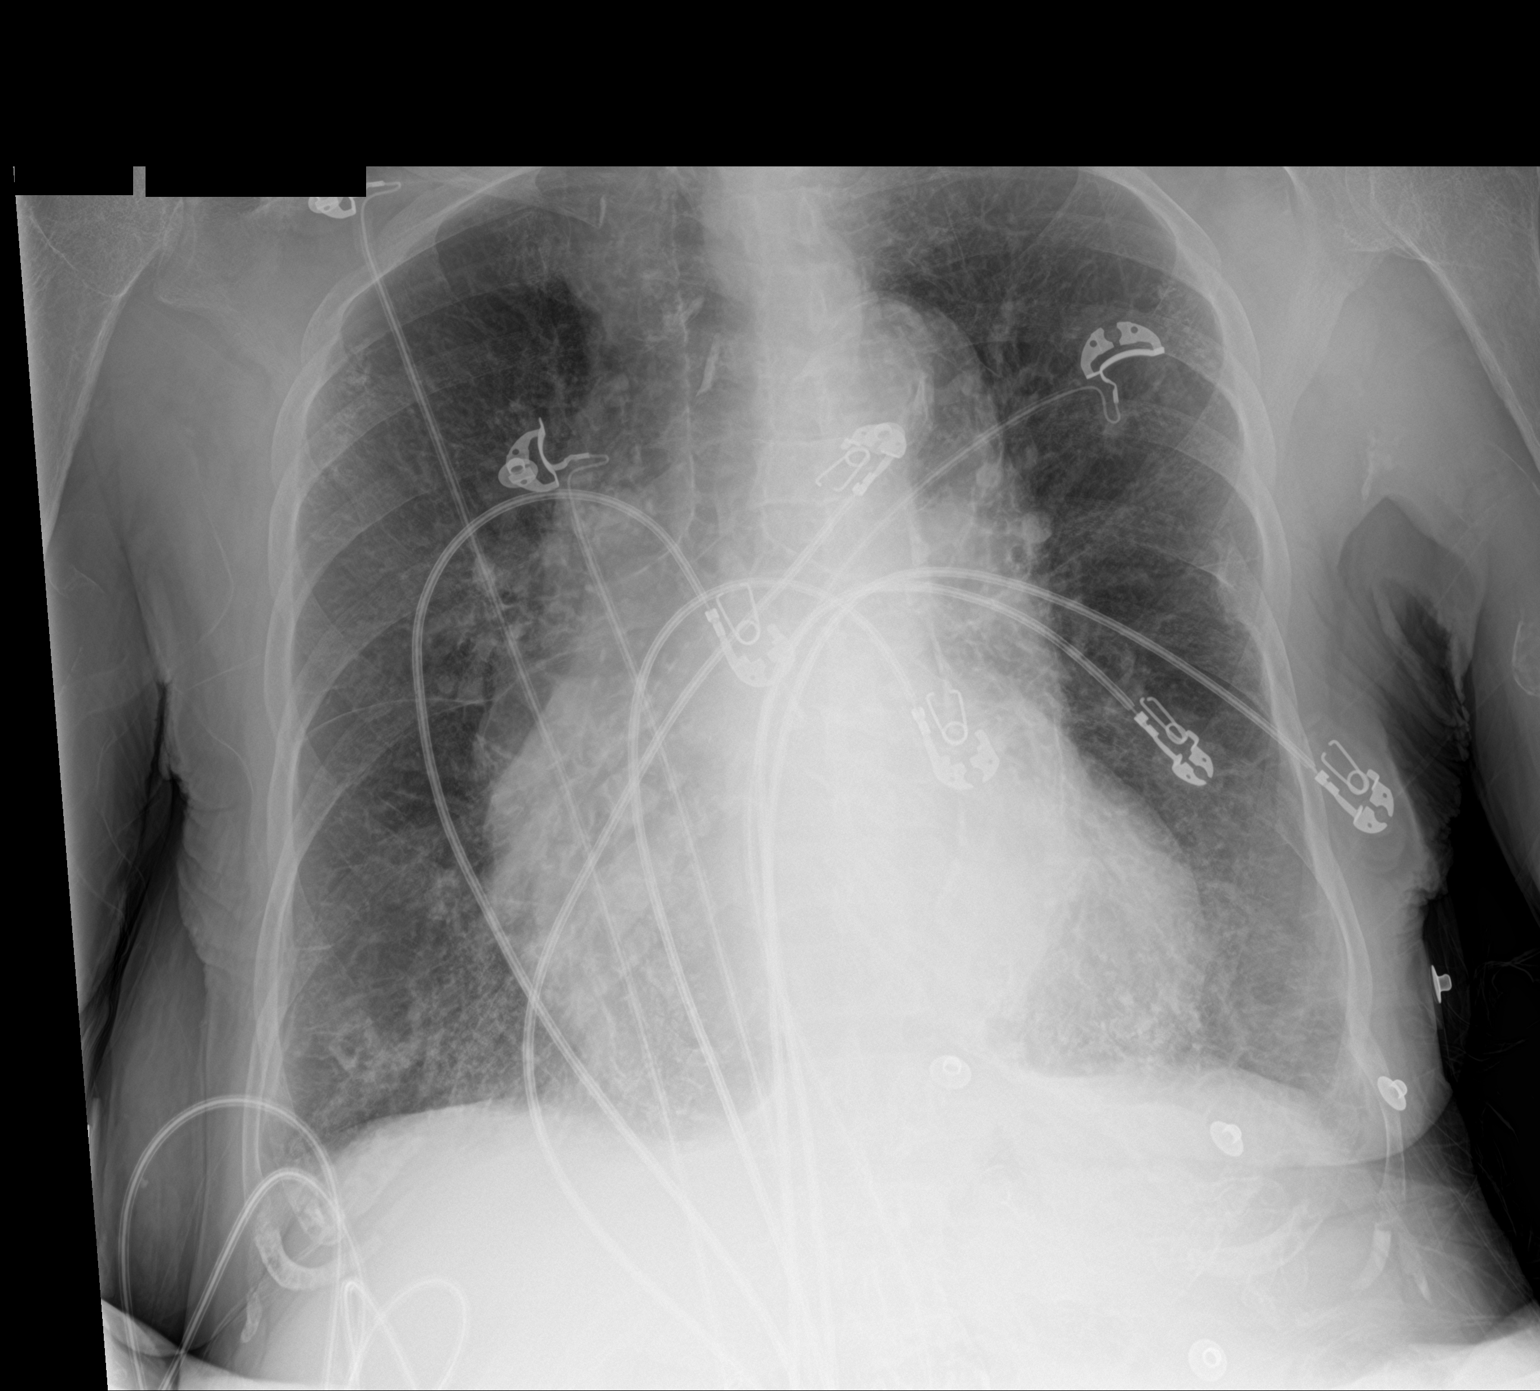

[2 of 2 positions shown; findings below may reference images not displayed]

FINDINGS: Moderate cardiomegaly remains stable. Diffuse interstitial
infiltrates show mild improvement since prior study, consistent with
decreased interstitial edema. No evidence of focal consolidation or
pleural effusion.
IMPRESSION: Mild improvement in diffuse interstitial infiltrates, consistent
with decreased interstitial edema.

Stable cardiomegaly.

## 2021-11-13 IMAGING — CR DG CHEST 2V
2 series · 2 of 2 positions shown · non-contrast
Comparison: 02/13/2021

CLINICAL DATA: Chest pain

EXAM:
CHEST - 2 VIEW

[chest lat]
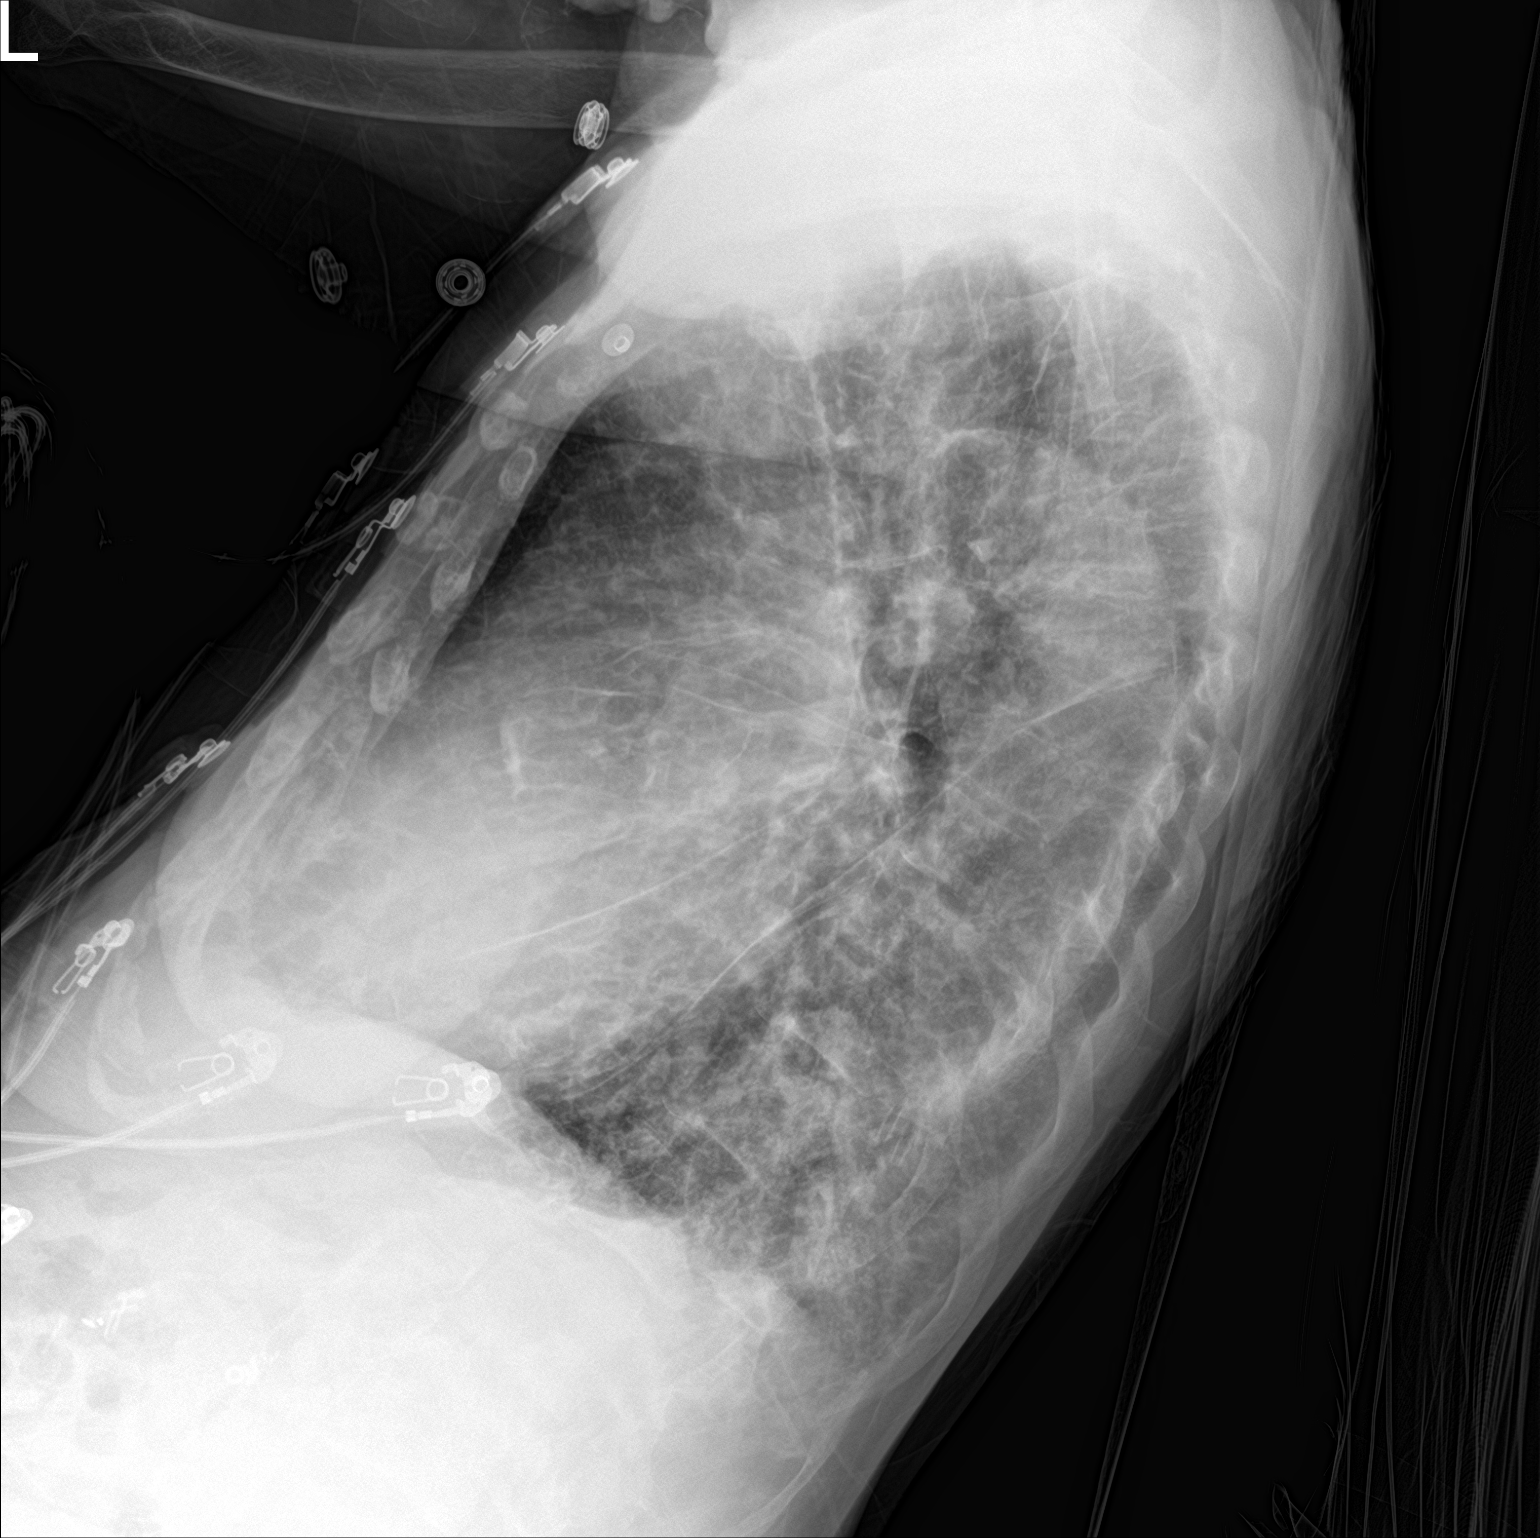

[chest ap]
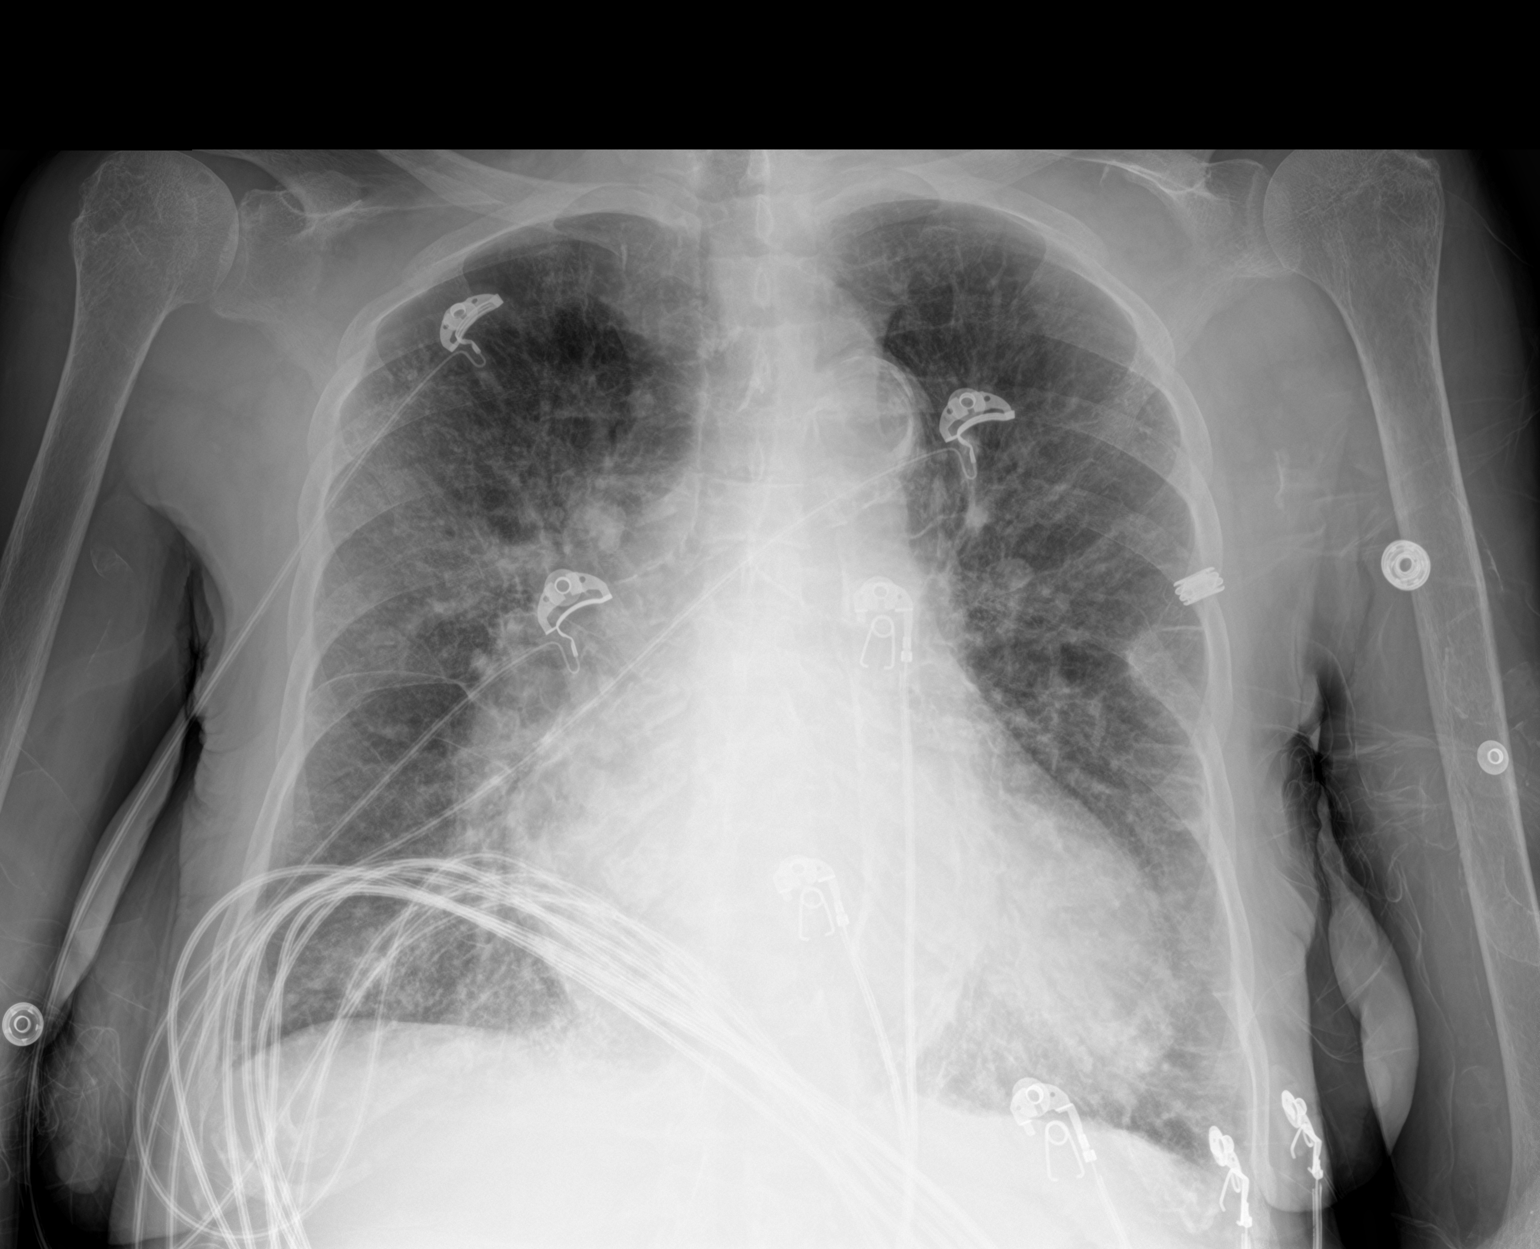

[2 of 2 positions shown; findings below may reference images not displayed]

FINDINGS: Cardiomegaly. Mild interstitial pulmonary edema. No pleural effusion
or pneumothorax. Calcific aortic atherosclerosis.
IMPRESSION: Cardiomegaly and mild interstitial pulmonary edema.

## 2021-11-14 ENCOUNTER — Encounter: Payer: Self-pay | Admitting: Nephrology

## 2021-11-14 DIAGNOSIS — N186 End stage renal disease: Secondary | ICD-10-CM | POA: Diagnosis not present

## 2021-11-14 DIAGNOSIS — Z992 Dependence on renal dialysis: Secondary | ICD-10-CM | POA: Diagnosis not present

## 2021-11-14 DIAGNOSIS — N2581 Secondary hyperparathyroidism of renal origin: Secondary | ICD-10-CM | POA: Diagnosis not present

## 2021-11-17 ENCOUNTER — Emergency Department (HOSPITAL_COMMUNITY)
Admission: EM | Admit: 2021-11-17 | Discharge: 2021-11-17 | Disposition: A | Discharge status: Home / Self Care | Payer: Medicare HMO | Attending: Emergency Medicine | Admitting: Emergency Medicine

## 2021-11-17 ENCOUNTER — Emergency Department (HOSPITAL_COMMUNITY): Payer: Medicare HMO

## 2021-11-17 DIAGNOSIS — R1084 Generalized abdominal pain: Secondary | ICD-10-CM | POA: Diagnosis not present

## 2021-11-17 DIAGNOSIS — K5730 Diverticulosis of large intestine without perforation or abscess without bleeding: Secondary | ICD-10-CM | POA: Diagnosis not present

## 2021-11-17 DIAGNOSIS — N281 Cyst of kidney, acquired: Secondary | ICD-10-CM | POA: Diagnosis not present

## 2021-11-17 DIAGNOSIS — I10 Essential (primary) hypertension: Secondary | ICD-10-CM | POA: Diagnosis not present

## 2021-11-17 DIAGNOSIS — Z992 Dependence on renal dialysis: Secondary | ICD-10-CM | POA: Insufficient documentation

## 2021-11-17 DIAGNOSIS — I132 Hypertensive heart and chronic kidney disease with heart failure and with stage 5 chronic kidney disease, or end stage renal disease: Secondary | ICD-10-CM | POA: Insufficient documentation

## 2021-11-17 DIAGNOSIS — R110 Nausea: Secondary | ICD-10-CM | POA: Diagnosis not present

## 2021-11-17 DIAGNOSIS — Z79899 Other long term (current) drug therapy: Secondary | ICD-10-CM | POA: Diagnosis not present

## 2021-11-17 DIAGNOSIS — I5033 Acute on chronic diastolic (congestive) heart failure: Secondary | ICD-10-CM | POA: Insufficient documentation

## 2021-11-17 DIAGNOSIS — D631 Anemia in chronic kidney disease: Secondary | ICD-10-CM | POA: Insufficient documentation

## 2021-11-17 DIAGNOSIS — R1030 Lower abdominal pain, unspecified: Secondary | ICD-10-CM | POA: Insufficient documentation

## 2021-11-17 DIAGNOSIS — N186 End stage renal disease: Secondary | ICD-10-CM | POA: Diagnosis not present

## 2021-11-17 DIAGNOSIS — I2510 Atherosclerotic heart disease of native coronary artery without angina pectoris: Secondary | ICD-10-CM | POA: Insufficient documentation

## 2021-11-17 DIAGNOSIS — R197 Diarrhea, unspecified: Secondary | ICD-10-CM

## 2021-11-17 DIAGNOSIS — K219 Gastro-esophageal reflux disease without esophagitis: Secondary | ICD-10-CM | POA: Diagnosis not present

## 2021-11-17 DIAGNOSIS — K449 Diaphragmatic hernia without obstruction or gangrene: Secondary | ICD-10-CM | POA: Diagnosis not present

## 2021-11-17 DIAGNOSIS — R0902 Hypoxemia: Secondary | ICD-10-CM | POA: Diagnosis not present

## 2021-11-17 DIAGNOSIS — Z87891 Personal history of nicotine dependence: Secondary | ICD-10-CM | POA: Diagnosis not present

## 2021-11-17 LAB — CBC
HCT: 31.4 % — ABNORMAL LOW (ref 36.0–46.0)
Hemoglobin: 10 g/dL — ABNORMAL LOW (ref 12.0–15.0)
MCH: 30.2 pg (ref 26.0–34.0)
MCHC: 31.8 g/dL (ref 30.0–36.0)
MCV: 94.9 fL (ref 80.0–100.0)
Platelets: 298 10*3/uL (ref 150–400)
RBC: 3.31 MIL/uL — ABNORMAL LOW (ref 3.87–5.11)
RDW: 16.7 % — ABNORMAL HIGH (ref 11.5–15.5)
WBC: 6.2 10*3/uL (ref 4.0–10.5)
nRBC: 0 % (ref 0.0–0.2)

## 2021-11-17 LAB — COMPREHENSIVE METABOLIC PANEL
ALT: 14 U/L (ref 0–44)
AST: 27 U/L (ref 15–41)
Albumin: 2.8 g/dL — ABNORMAL LOW (ref 3.5–5.0)
Alkaline Phosphatase: 174 U/L — ABNORMAL HIGH (ref 38–126)
Anion gap: 15 (ref 5–15)
BUN: 74 mg/dL — ABNORMAL HIGH (ref 8–23)
CO2: 25 mmol/L (ref 22–32)
Calcium: 7.5 mg/dL — ABNORMAL LOW (ref 8.9–10.3)
Chloride: 96 mmol/L — ABNORMAL LOW (ref 98–111)
Creatinine, Ser: 6.4 mg/dL — ABNORMAL HIGH (ref 0.44–1.00)
GFR, Estimated: 6 mL/min — ABNORMAL LOW (ref >60)
Glucose, Bld: 97 mg/dL (ref 70–99)
Potassium: 4.6 mmol/L (ref 3.5–5.1)
Sodium: 136 mmol/L (ref 135–145)
Total Bilirubin: 0.6 mg/dL (ref 0.3–1.2)
Total Protein: 7.2 g/dL (ref 6.5–8.1)

## 2021-11-17 LAB — LIPASE, BLOOD: Lipase: 58 U/L — ABNORMAL HIGH (ref 11–51)

## 2021-11-17 NOTE — ED Provider Notes (Signed)
[facility] EMERGENCY DEPARTMENT Provider Note   CSN: [id number] Arrival date & time: 11/17/21  3419     History Chief Complaint  Patient presents with   Abdominal Pain   Diarrhea    [name] is a 70 y.o. female.  HPI  70 year old female with past medical history of ESRD on HD, CAD, HTN, HLD, CVA presents the emergency department with an episode of diarrhea.  Patient states about a month ago she was diagnosed diverticulitis.  Took antibiotics and her symptoms resolved.  Patient felt in her usual state of health last night when she went to bed.  Woke up this morning with urgent episode of diarrhea.  The stool was nonbloody.  She is now complaining of some mild cramping in her lower abdomen.  No fever.  She endorses mild nausea without vomiting.  She is due for dialysis today.  No chest pain, shortness of breath or cough.  Past Medical History:  Diagnosis Date   Adenomatous polyp of colon 10/2010, 2006, 2015   Anemia in CKD (chronic kidney disease) 11/07/2012   s/p blood transfusion.    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  Had some early rejection then stable function for years, then had slow decline of function and went back on hemodialysis in 2012.  Gets HD TTS schedule at [facility] on [facility] still using L forearm AVF.      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[place])    left upper arm   Pulmonary edema 12/2019    Stroke ([place]) 11/2015   TIA   Weight loss, unintentional     Patient Active Problem List   Diagnosis Date Noted   Acute on chronic diastolic (congestive) heart failure ([place]) 11/08/2021   GERD (gastroesophageal reflux disease)    Protein-calorie malnutrition, severe 08/12/2021   Diverticulitis of colon with perforation 08/06/2021   Acute respiratory failure with hypoxia ([place]) 08/06/2021   Major depressive disorder, recurrent episode, moderate ([place]) 02/23/2021   TIA (transient ischemic attack) 02/21/2021   Prolonged QT interval 02/21/2021   NSTEMI (non-ST elevated myocardial infarction) ([place]) 05/14/2020   Volume overload 05/13/2020   Hypotension after procedure 03/16/2020   Chest pain 03/16/2020   Change in mental state 03/15/2020   Ventricular tachycardia 03/15/2020   Seizure ([place]) 03/15/2020   Unresponsive episode 03/15/2020   Pulmonary edema 37/90/2409   Complication of vascular access for dialysis 12/21/2019   Breakdown (mechanical) of surgically created arteriovenous fistula, initial encounter ([place]) 12/18/2019   Weakness 10/11/2019   Aneurysm artery, 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   Dependence on renal dialysis ([place]) 07/24/2019   Iron deficiency anemia, unspecified 06/09/2019   Age-related osteoporosis without current pathological fracture 04/17/2019   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   Coagulation defect, unspecified ([place]) 04/17/2019   Diarrhea, unspecified 04/17/2019  Encounter for screening for depression 04/17/2019   Headache, unspecified 04/17/2019   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   Pain, unspecified 04/17/2019   Polyp of colon 04/17/2019   Primary generalized (osteo)arthritis 04/17/2019   Pruritus, unspecified 04/17/2019   Pure hypercholesterolemia, unspecified 04/17/2019   Secondary hyperparathyroidism of renal origin ([place]) 04/17/2019   Gastro-esophageal reflux disease without esophagitis 04/17/2019   Essential (primary)  hypertension 04/17/2019   Hyperlipidemia, unspecified 04/17/2019   Transient cerebral ischemic attack, unspecified 04/17/2019   Hypoxemia 12/13/2018   Prolonged Q-T interval on ECG 02/24/2017   Hypokalemia 02/24/2017   Acute ischemic stroke ([place]) 02/23/2017   Malnutrition of moderate degree 12/24/2016   Problem with dialysis access ([place]) 12/21/2016   Acute ischemic colitis ([place]) 05/16/2016   Colitis 05/15/2016   Rectal bleeding 05/15/2016   Neurologic abnormality 11/19/2015   Hyperkalemia 11/19/2015   Anxiety 11/19/2015   Insomnia 11/19/2015   Gait instability    Stroke-like symptom 11/16/2015   Vestibular neuritis 11/16/2015   Stroke (cerebrum) ([place]) 11/16/2015   Dizziness 05/09/2015   Ataxia 05/09/2015   H/O: CVA (cerebrovascular accident) 05/09/2015   Left facial numbness 05/09/2015   Left leg numbness 05/09/2015   Hyperlipidemia    Acute on chronic diastolic CHF (congestive heart failure) ([place])    SOB (shortness of breath) 04/01/2013   HTN (hypertension) 04/01/2013   Cholecystitis, acute 11/07/2012   End-stage renal disease on hemodialysis ([place]) 11/07/2012   Anemia in chronic kidney disease 11/07/2012   Dyspnea 12/31/2011    Past Surgical History:  Procedure Laterality Date   AV FISTULA PLACEMENT     for dialysis   AV FISTULA PLACEMENT Left 11/22/2015   Procedure: ARTERIOVENOUS (AV) FISTULA CREATION-LEFT BRACHIOCEPHALIC;  Surgeon: Serafina Mitchell, MD;  Location: [place];  Service: Vascular;  Laterality: Left;   AV FISTULA PLACEMENT Right 03/15/2020   Procedure: INSERTION OF ARTERIOVENOUS (AV) GORE-TEX GRAFT ARM ( BRACHIAL AXILLARY );  Surgeon: Katha Cabal, MD;  Location: [facility];  Service: Vascular;  Laterality: Right;   BACK SURGERY     CERVICAL FUSION     CHOLECYSTECTOMY  12/02/2012   Procedure: LAPAROSCOPIC CHOLECYSTECTOMY WITH INTRAOPERATIVE CHOLANGIOGRAM;  Surgeon: Edward Jolly, MD;  Location: [place];  Service: General;  Laterality: N/A;   EYE SURGERY  Bilateral    cataract surgery   EYE SURGERY Left 2019   laser   HEMATOMA EVACUATION Left 12/24/2016   Procedure: EVACUATION HEMATOMA LEFT UPPER ARM;  Surgeon: Waynetta Sandy, MD;  Location: [place];  Service: Vascular;  Laterality: Left;   I & D EXTREMITY Left 12/31/2016   Procedure: IRRIGATION AND DEBRIDEMENT EXTREMITY;  Surgeon: Angelia Mould, MD;  Location: [place];  Service: Vascular;  Laterality: Left;   INSERTION OF DIALYSIS CATHETER Right 12/24/2016   Procedure: INSERTION OF DIALYSIS CATHETER;  Surgeon: Waynetta Sandy, MD;  Location: [place];  Service: Vascular;  Laterality: Right;   INSERTION OF DIALYSIS CATHETER Right 02/04/2017   Procedure: INSERTION OF DIALYSIS CATHETER;  Surgeon: Waynetta Sandy, MD;  Location: [facility];  Service: Vascular;  Laterality: Right;   KIDNEY TRANSPLANT  1996   PERIPHERAL VASCULAR CATHETERIZATION Left 10/23/2016   Procedure: Fistulagram;  Surgeon: Elam Dutch, MD;  Location: [place] CV LAB;  Service: Cardiovascular;  Laterality: Left;   PERIPHERAL VASCULAR THROMBECTOMY Right 04/16/2020   Procedure: PERIPHERAL VASCULAR THROMBECTOMY;  Surgeon: Katha Cabal, MD;  Location: [place] CV LAB;  Service: Cardiovascular;  Laterality: Right;   RESECTION OF ARTERIOVENOUS FISTULA  ANEURYSM Left 11/22/2015   Procedure: RESECTION OF LEFT RADIOCEPHALIC FISTULA ANEURYSM ;  Surgeon: Serafina Mitchell, MD;  Location: [place];  Service: Vascular;  Laterality: Left;   REVISON OF ARTERIOVENOUS FISTULA Left 12/22/2016   Procedure: REVISON OF LEFT ARTERIOVENOUS FISTULA;  Surgeon: Waynetta Sandy, MD;  Location: [place];  Service: Vascular;  Laterality: Left;   REVISON OF ARTERIOVENOUS FISTULA Left 02/04/2017   Procedure: REVISON OF LEFT UPPER ARM ARTERIOVENOUS FISTULA;  Surgeon: Waynetta Sandy, MD;  Location: [place];  Service: Vascular;  Laterality: Left;   UPPER EXTREMITY ANGIOGRAPHY Bilateral 09/19/2019   Procedure: UPPER  EXTREMITY ANGIOGRAPHY;  Surgeon: Katha Cabal, MD;  Location: [place] CV LAB;  Service: Cardiovascular;  Laterality: Bilateral;     OB History   No obstetric history on file.     Family History  Problem Relation Age of Onset   Colon cancer Brother    Cancer Brother    Coronary artery disease Mother 88   Hyperlipidemia Mother    Hypertension Mother    Stroke Maternal Aunt    Esophageal cancer Neg Hx    Stomach cancer Neg Hx    Rectal cancer Neg Hx     Social History   Tobacco Use   Smoking status: Former    Types: Cigarettes    Quit date: 12/31/1991    Years since quitting: 29.9   Smokeless tobacco: Never  Vaping Use   Vaping Use: Never used  Substance Use Topics   Alcohol use: No    Alcohol/week: 0.0 standard drinks   Drug use: No    Home Medications Prior to Admission medications   Medication Sig Start Date End Date Taking? Authorizing Provider  acetaminophen (TYLENOL) 500 MG tablet Take 1 tablet (500 mg total) by mouth every 6 (six) hours as needed. Patient taking differently: Take 1,000 mg by mouth every 6 (six) hours as needed for mild pain. 09/05/18   Law, Bea Graff, PA-C  albuterol (VENTOLIN HFA) 108 (90 Base) MCG/ACT inhaler Inhale 1-2 puffs into the lungs every 6 (six) hours as needed for wheezing or shortness of breath. 10/26/21   Jeanell Sparrow, DO  allopurinol (ZYLOPRIM) 300 MG tablet Take 300 mg by mouth daily. 11/03/21   [provider]  ALPRAZolam Duanne Moron) 0.25 MG tablet Take 1 tablet (0.25 mg total) by mouth daily as needed for anxiety. 08/18/21   Nita Sells, MD  atorvastatin (LIPITOR) 80 MG tablet TAKE 1 TABLET (80 MG TOTAL) BY MOUTH DAILY. Patient not taking: Reported on 11/08/2021 02/25/21 02/25/22  Dessa Phi, DO  cinacalcet (SENSIPAR) 30 MG tablet Take 30 mg by mouth 3 (three) times a week. Mon, Wed, Fri-- Given @ Dialysis 01/04/20   [provider]  folic acid-vitamin b complex-vitamin c-selenium-zinc  (DIALYVITE) 3 MG TABS tablet Take 1 tablet by mouth daily.    [provider]  losartan (COZAAR) 50 MG tablet Take 50 mg by mouth daily. 11/03/21   [provider]  ondansetron (ZOFRAN) 8 MG tablet Take 8 mg by mouth 2 (two) times daily as needed for nausea or vomiting.     [provider]  pantoprazole (PROTONIX) 40 MG tablet Take 1 tablet (40 mg total) by mouth daily. 08/18/21   Nita Sells, MD  zolpidem (AMBIEN) 10 MG tablet Take 10 mg by mouth at bedtime as needed for sleep. 02/13/21   [provider]    Allergies    Sulfa antibiotics and Adhesive [tape]  Review of Systems  Review of Systems  Constitutional:  Negative for chills and fever.  HENT:  Negative for congestion.   Eyes:  Negative for visual disturbance.  Respiratory:  Negative for shortness of breath.   Cardiovascular:  Negative for chest pain.  Gastrointestinal:  Positive for abdominal pain, diarrhea and nausea. Negative for anal bleeding, blood in stool and vomiting.  Genitourinary:  Negative for dysuria.  Skin:  Negative for rash.  Neurological:  Negative for headaches.   Physical Exam Updated Vital Signs BP 132/67   Pulse 93   Temp 98.3 F (36.8 C)   Resp (!) 24   Ht 5\' 1"  (6.384 m)   Wt 56.7 kg   SpO2 95%   BMI 23.62 kg/m   Physical Exam Vitals and nursing note reviewed.  Constitutional:      General: She is not in acute distress.    Appearance: Normal appearance.  HENT:     Head: Normocephalic.     Mouth/Throat:     Mouth: Mucous membranes are moist.  Cardiovascular:     Rate and Rhythm: Normal rate.  Pulmonary:     Effort: Pulmonary effort is normal. No respiratory distress.  Abdominal:     Palpations: Abdomen is soft.     Tenderness: There is abdominal tenderness in the periumbilical area. There is no guarding or rebound.  Skin:    General: Skin is warm.  Neurological:     Mental Status: She is alert and oriented to person, place, and time. Mental  status is at baseline.  Psychiatric:        Mood and Affect: Mood normal.    ED Results / Procedures / Treatments   Labs (all labs ordered are listed, but only abnormal results are displayed) Labs Reviewed  LIPASE, BLOOD - Abnormal; Notable for the following components:      Result Value   Lipase 58 (*)    All other components within normal limits  COMPREHENSIVE METABOLIC PANEL - Abnormal; Notable for the following components:   Chloride 96 (*)    BUN 74 (*)    Creatinine, Ser 6.40 (*)    Calcium 7.5 (*)    Albumin 2.8 (*)    Alkaline Phosphatase 174 (*)    GFR, Estimated 6 (*)    All other components within normal limits  CBC - Abnormal; Notable for the following components:   RBC 3.31 (*)    Hemoglobin 10.0 (*)    HCT 31.4 (*)    RDW 16.7 (*)    All other components within normal limits  URINALYSIS, ROUTINE W REFLEX MICROSCOPIC    EKG None  Radiology No results found.  Procedures Procedures   Medications Ordered in ED Medications - No data to display  ED Course  I have reviewed the triage vital signs and the nursing notes.  Pertinent labs & imaging results that were available during my care of the patient were reviewed by me and considered in my medical decision making (see chart for details).    MDM Rules/Calculators/A&P                           70 year old female presents emergency department after 1 episode of nonbloody diarrhea.  Vitals are stable on arrival.  Blood work is baseline for the patient, lipase is slightly elevated at 58.  However abdominal pain is lower, lower suspicion for pancreatitis.  CT of the abdomen pelvis shows no acute finding, low suspicion for acute diverticulitis.  No fever or white count.  Do not feel antibiotics is warranted at this time.  She otherwise has a reassuring exam, has been able to eat and drink.  Offers no new concerns or complaints.  Patient at this time appears safe and stable for discharge and will be treated as  an outpatient.  Discharge plan and strict return to ED precautions discussed, patient verbalizes understanding and agreement.  Final Clinical Impression(s) / ED Diagnoses Final diagnoses:  None    Rx / DC Orders ED Discharge Orders     None        Lorelle Gibbs, DO 11/17/21 1434

## 2021-11-17 NOTE — Discharge Instructions (Signed)
You have been seen and discharged from the emergency department.  Your blood work was baseline, your CAT scan showed no acute findings.  Continue with your hemodialysis.  Follow-up with your primary provider for reevaluation and further care. Take home medications as prescribed. If you have any worsening symptoms or further concerns for your health please return to an emergency department for further evaluation.

## 2021-11-17 NOTE — ED Triage Notes (Signed)
EMS stated, pt has diarrhea and was just dx with diverticulitis.Has stomach pain and was saying sometimes she has nausea.

## 2021-11-17 NOTE — ED Notes (Signed)
Took patient to the bathroom patient did well patient is now back in bed on the monitor with call bell in reach

## 2021-11-17 NOTE — ED Triage Notes (Signed)
Pt is scheduled for dialysis today.

## 2021-11-18 ENCOUNTER — Emergency Department (HOSPITAL_COMMUNITY)
Admission: EM | Admit: 2021-11-18 | Discharge: 2021-11-18 | Disposition: A | Discharge status: Home / Self Care | Payer: Medicare HMO | Attending: Emergency Medicine | Admitting: Emergency Medicine

## 2021-11-18 ENCOUNTER — Encounter (HOSPITAL_COMMUNITY): Payer: Self-pay | Admitting: Emergency Medicine

## 2021-11-18 ENCOUNTER — Ambulatory Visit: Payer: Medicare HMO | Admitting: Cardiovascular Disease

## 2021-11-18 ENCOUNTER — Other Ambulatory Visit: Payer: Self-pay

## 2021-11-18 ENCOUNTER — Emergency Department (HOSPITAL_COMMUNITY): Payer: Medicare HMO

## 2021-11-18 DIAGNOSIS — N186 End stage renal disease: Secondary | ICD-10-CM | POA: Diagnosis not present

## 2021-11-18 DIAGNOSIS — K219 Gastro-esophageal reflux disease without esophagitis: Secondary | ICD-10-CM | POA: Diagnosis not present

## 2021-11-18 DIAGNOSIS — R110 Nausea: Secondary | ICD-10-CM | POA: Insufficient documentation

## 2021-11-18 DIAGNOSIS — R0981 Nasal congestion: Secondary | ICD-10-CM | POA: Diagnosis not present

## 2021-11-18 DIAGNOSIS — I2510 Atherosclerotic heart disease of native coronary artery without angina pectoris: Secondary | ICD-10-CM | POA: Insufficient documentation

## 2021-11-18 DIAGNOSIS — J811 Chronic pulmonary edema: Secondary | ICD-10-CM | POA: Diagnosis not present

## 2021-11-18 DIAGNOSIS — R109 Unspecified abdominal pain: Secondary | ICD-10-CM | POA: Insufficient documentation

## 2021-11-18 DIAGNOSIS — R0602 Shortness of breath: Secondary | ICD-10-CM | POA: Insufficient documentation

## 2021-11-18 DIAGNOSIS — Z20822 Contact with and (suspected) exposure to covid-19: Secondary | ICD-10-CM | POA: Diagnosis not present

## 2021-11-18 DIAGNOSIS — I5033 Acute on chronic diastolic (congestive) heart failure: Secondary | ICD-10-CM | POA: Diagnosis not present

## 2021-11-18 DIAGNOSIS — D631 Anemia in chronic kidney disease: Secondary | ICD-10-CM | POA: Insufficient documentation

## 2021-11-18 DIAGNOSIS — R059 Cough, unspecified: Secondary | ICD-10-CM | POA: Diagnosis not present

## 2021-11-18 DIAGNOSIS — I10 Essential (primary) hypertension: Secondary | ICD-10-CM | POA: Diagnosis not present

## 2021-11-18 DIAGNOSIS — I517 Cardiomegaly: Secondary | ICD-10-CM | POA: Diagnosis not present

## 2021-11-18 DIAGNOSIS — I132 Hypertensive heart and chronic kidney disease with heart failure and with stage 5 chronic kidney disease, or end stage renal disease: Secondary | ICD-10-CM | POA: Diagnosis not present

## 2021-11-18 DIAGNOSIS — R0600 Dyspnea, unspecified: Secondary | ICD-10-CM | POA: Diagnosis not present

## 2021-11-18 DIAGNOSIS — Z992 Dependence on renal dialysis: Secondary | ICD-10-CM | POA: Diagnosis not present

## 2021-11-18 DIAGNOSIS — I120 Hypertensive chronic kidney disease with stage 5 chronic kidney disease or end stage renal disease: Secondary | ICD-10-CM | POA: Diagnosis not present

## 2021-11-18 LAB — I-STAT VENOUS BLOOD GAS, ED
Acid-Base Excess: 3 mmol/L — ABNORMAL HIGH (ref 0.0–2.0)
Bicarbonate: 23.8 mmol/L (ref 20.0–28.0)
Calcium, Ion: 0.71 mmol/L — CL (ref 1.15–1.40)
HCT: 37 % (ref 36.0–46.0)
Hemoglobin: 12.6 g/dL (ref 12.0–15.0)
O2 Saturation: 100 %
Potassium: 5.4 mmol/L — ABNORMAL HIGH (ref 3.5–5.1)
Sodium: 132 mmol/L — ABNORMAL LOW (ref 135–145)
TCO2: 25 mmol/L (ref 22–32)
pCO2, Ven: 25.7 mmHg — ABNORMAL LOW (ref 44.0–60.0)
pH, Ven: 7.575 — ABNORMAL HIGH (ref 7.250–7.430)
pO2, Ven: 204 mmHg — ABNORMAL HIGH (ref 32.0–45.0)

## 2021-11-18 LAB — CBC WITH DIFFERENTIAL/PLATELET
Abs Immature Granulocytes: 0.01 K/uL (ref 0.00–0.07)
Basophils Absolute: 0.1 K/uL (ref 0.0–0.1)
Basophils Relative: 1 %
Eosinophils Absolute: 0.1 K/uL (ref 0.0–0.5)
Eosinophils Relative: 1 %
HCT: 32.7 % — ABNORMAL LOW (ref 36.0–46.0)
Hemoglobin: 10.7 g/dL — ABNORMAL LOW (ref 12.0–15.0)
Immature Granulocytes: 0 %
Lymphocytes Relative: 25 %
Lymphs Abs: 1.4 K/uL (ref 0.7–4.0)
MCH: 30.9 pg (ref 26.0–34.0)
MCHC: 32.7 g/dL (ref 30.0–36.0)
MCV: 94.5 fL (ref 80.0–100.0)
Monocytes Absolute: 0.9 K/uL (ref 0.1–1.0)
Monocytes Relative: 16 %
Neutro Abs: 3.2 K/uL (ref 1.7–7.7)
Neutrophils Relative %: 57 %
Platelets: 311 K/uL (ref 150–400)
RBC: 3.46 MIL/uL — ABNORMAL LOW (ref 3.87–5.11)
RDW: 16.6 % — ABNORMAL HIGH (ref 11.5–15.5)
WBC: 5.5 K/uL (ref 4.0–10.5)
nRBC: 0 % (ref 0.0–0.2)

## 2021-11-18 LAB — COMPREHENSIVE METABOLIC PANEL WITH GFR
ALT: 15 U/L (ref 0–44)
AST: 29 U/L (ref 15–41)
Albumin: 2.9 g/dL — ABNORMAL LOW (ref 3.5–5.0)
Alkaline Phosphatase: 185 U/L — ABNORMAL HIGH (ref 38–126)
Anion gap: 19 — ABNORMAL HIGH (ref 5–15)
BUN: 88 mg/dL — ABNORMAL HIGH (ref 8–23)
CO2: 22 mmol/L (ref 22–32)
Calcium: 7.7 mg/dL — ABNORMAL LOW (ref 8.9–10.3)
Chloride: 94 mmol/L — ABNORMAL LOW (ref 98–111)
Creatinine, Ser: 7.76 mg/dL — ABNORMAL HIGH (ref 0.44–1.00)
GFR, Estimated: 5 mL/min — ABNORMAL LOW
Glucose, Bld: 97 mg/dL (ref 70–99)
Potassium: 4.9 mmol/L (ref 3.5–5.1)
Sodium: 135 mmol/L (ref 135–145)
Total Bilirubin: 1.1 mg/dL (ref 0.3–1.2)
Total Protein: 7.6 g/dL (ref 6.5–8.1)

## 2021-11-18 LAB — RESP PANEL BY RT-PCR (FLU A&B, COVID) ARPGX2
Influenza A by PCR: NEGATIVE
Influenza B by PCR: NEGATIVE
SARS Coronavirus 2 by RT PCR: NEGATIVE

## 2021-11-18 LAB — AMMONIA: Ammonia: 33 umol/L (ref 9–35)

## 2021-11-18 LAB — BRAIN NATRIURETIC PEPTIDE: B Natriuretic Peptide: >4500 pg/mL — ABNORMAL HIGH (ref 0.0–100.0)

## 2021-11-18 NOTE — ED Provider Notes (Signed)
Emergency Medicine Provider Triage Evaluation Note  [name] , a 70 y.o. female  was evaluated in triage.  Pt complains of dyspnea. Feeling poorly x 1 week. Reports congestion, cough productive of phlegm sputum, dyspnea & nausea. No fevers, but has been feeling warm. No alleviating/aggravating factors. Reports she missed dialysis. Seen in the ED yesterday for diarrhea.   Review of Systems  Positive: Cough, congestion, dyspnea, nausea Negative: Vomiting, chest pain, syncope.   Physical Exam  BP (!) 154/68 (BP Location: Left Leg)   Pulse (!) 103   Temp 98.3 F (36.8 C)   Resp 18   SpO2 96%  Gen:   Awake, no distress   Resp:  Normal effort  MSK:   Moves extremities without difficulty  Medical Decision Making  Medically screening exam initiated at 3:05 AM.  Appropriate orders placed.  [name] was informed that the remainder of the evaluation will be completed by another provider, this initial triage assessment does not replace that evaluation, and the importance of remaining in the ED until their evaluation is complete.  Dyspnea   Amaryllis Dyke, PA-C 11/18/21 [id number]    Merryl Hacker, MD 11/18/21 [phone]

## 2021-11-18 NOTE — Progress Notes (Signed)
  70 year old female with ESRD (chronic HD MWF [facility]), hypertension, depression, CHF, CAD remote CVA presents to the ER complaint diarrhea missed treatment today short of breath CXR =no active disease.  K5.4, Hgb 12.6  Currently ER status /not admitted she wants to leave after dialysis today plan for dialysis today and discharge if continues stable.   Ernest Haber, PA-C [facility] [phone] 11/18/2021,11:50 AM  LOS: 0 days

## 2021-11-18 NOTE — ED Notes (Signed)
Hemodialysis treatment consent obtained. Pt transferred off the unit to dialysis.

## 2021-11-18 NOTE — Progress Notes (Signed)
Contacted by Dr Joelyn Oms to see if pt can receive HD treatment today at pt's out-pt clinic. Spoke to [place], Therapist, sports, at [employer] [employer]. Clinic does not have any availability to complete treatment today but pt can receive regular treatment tomorrow (MWF 11:35 arrival with 11:55 chair time). MD made aware of this info.   Melven Sartorius Renal Navigator [phone]

## 2021-11-18 NOTE — ED Provider Notes (Signed)
[facility] EMERGENCY DEPARTMENT Provider Note   CSN: [id number] Arrival date & time: 11/18/21  0302     History Chief Complaint  Patient presents with   Shortness of Breath    [name] is a 70 y.o. female.   Shortness of Breath Associated symptoms: abdominal pain and cough   Associated symptoms: no chest pain, no ear pain, no fever, no headaches, no neck pain, no rash, no sore throat, no vomiting and no wheezing   Patient presents for shortness of breath.  This has been ongoing over the past week.  She also endorses congestion, productive cough, and nausea.  She has an extensive medical history, including ESRD.  Last HD session was on Friday.  Yesterday, she is seen in the ED for diarrhea.  She endorses continued diarrhea and similar lower abdominal pain.  Per chart review, she did undergo a CT scan of abdomen pelvis yesterday with no acute findings.     Past Medical History:  Diagnosis Date   Adenomatous polyp of colon 10/2010, 2006, 2015   Anemia in CKD (chronic kidney disease) 11/07/2012   s/p blood transfusion.    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  Had some early rejection then stable function for years, then had slow decline of function and went back on hemodialysis in 2012.  Gets HD TTS schedule at [facility] on [facility] still using L forearm AVF.      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[place])    left  upper arm   Pulmonary edema 12/2019   Stroke ([place]) 11/2015   TIA   Weight loss, unintentional     Patient Active Problem List   Diagnosis Date Noted   Acute on chronic diastolic (congestive) heart failure ([place]) 11/08/2021   GERD (gastroesophageal reflux disease)    Protein-calorie malnutrition, severe 08/12/2021   Diverticulitis of colon with perforation 08/06/2021   Acute respiratory failure with hypoxia ([place]) 08/06/2021   Major depressive disorder, recurrent episode, moderate ([place]) 02/23/2021   TIA (transient ischemic attack) 02/21/2021   Prolonged QT interval 02/21/2021   NSTEMI (non-ST elevated myocardial infarction) ([place]) 05/14/2020   Volume overload 05/13/2020   Hypotension after procedure 03/16/2020   Chest pain 03/16/2020   Change in mental state 03/15/2020   Ventricular tachycardia 03/15/2020   Seizure ([place]) 03/15/2020   Unresponsive episode 03/15/2020   Pulmonary edema 06/06/7627   Complication of vascular access for dialysis 12/21/2019   Breakdown (mechanical) of surgically created arteriovenous fistula, initial encounter ([place]) 12/18/2019   Weakness 10/11/2019   Aneurysm artery, 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   Dependence on renal dialysis ([place]) 07/24/2019   Iron deficiency anemia, unspecified 06/09/2019   Age-related osteoporosis without current pathological fracture 04/17/2019   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   Coagulation defect, unspecified ([place]) 04/17/2019  Diarrhea, unspecified 04/17/2019   Encounter for screening for depression 04/17/2019   Headache, unspecified 04/17/2019   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   Pain, unspecified 04/17/2019   Polyp of colon 04/17/2019   Primary generalized (osteo)arthritis 04/17/2019   Pruritus, unspecified 04/17/2019   Pure hypercholesterolemia, unspecified 04/17/2019   Secondary hyperparathyroidism of renal origin ([place]) 04/17/2019   Gastro-esophageal reflux disease without esophagitis  04/17/2019   Essential (primary) hypertension 04/17/2019   Hyperlipidemia, unspecified 04/17/2019   Transient cerebral ischemic attack, unspecified 04/17/2019   Hypoxemia 12/13/2018   Prolonged Q-T interval on ECG 02/24/2017   Hypokalemia 02/24/2017   Acute ischemic stroke ([place]) 02/23/2017   Malnutrition of moderate degree 12/24/2016   Problem with dialysis access ([place]) 12/21/2016   Acute ischemic colitis ([place]) 05/16/2016   Colitis 05/15/2016   Rectal bleeding 05/15/2016   Neurologic abnormality 11/19/2015   Hyperkalemia 11/19/2015   Anxiety 11/19/2015   Insomnia 11/19/2015   Gait instability    Stroke-like symptom 11/16/2015   Vestibular neuritis 11/16/2015   Stroke (cerebrum) ([place]) 11/16/2015   Dizziness 05/09/2015   Ataxia 05/09/2015   H/O: CVA (cerebrovascular accident) 05/09/2015   Left facial numbness 05/09/2015   Left leg numbness 05/09/2015   Hyperlipidemia    Acute on chronic diastolic CHF (congestive heart failure) ([place])    SOB (shortness of breath) 04/01/2013   HTN (hypertension) 04/01/2013   Cholecystitis, acute 11/07/2012   End-stage renal disease on hemodialysis ([place]) 11/07/2012   Anemia in chronic kidney disease 11/07/2012   Dyspnea 12/31/2011    Past Surgical History:  Procedure Laterality Date   AV FISTULA PLACEMENT     for dialysis   AV FISTULA PLACEMENT Left 11/22/2015   Procedure: ARTERIOVENOUS (AV) FISTULA CREATION-LEFT BRACHIOCEPHALIC;  Surgeon: Serafina Mitchell, MD;  Location: [place];  Service: Vascular;  Laterality: Left;   AV FISTULA PLACEMENT Right 03/15/2020   Procedure: INSERTION OF ARTERIOVENOUS (AV) GORE-TEX GRAFT ARM ( BRACHIAL AXILLARY );  Surgeon: Katha Cabal, MD;  Location: [facility];  Service: Vascular;  Laterality: Right;   BACK SURGERY     CERVICAL FUSION     CHOLECYSTECTOMY  12/02/2012   Procedure: LAPAROSCOPIC CHOLECYSTECTOMY WITH INTRAOPERATIVE CHOLANGIOGRAM;  Surgeon: Edward Jolly, MD;  Location: [place];  Service: General;   Laterality: N/A;   EYE SURGERY Bilateral    cataract surgery   EYE SURGERY Left 2019   laser   HEMATOMA EVACUATION Left 12/24/2016   Procedure: EVACUATION HEMATOMA LEFT UPPER ARM;  Surgeon: Waynetta Sandy, MD;  Location: [place];  Service: Vascular;  Laterality: Left;   I & D EXTREMITY Left 12/31/2016   Procedure: IRRIGATION AND DEBRIDEMENT EXTREMITY;  Surgeon: Angelia Mould, MD;  Location: [place];  Service: Vascular;  Laterality: Left;   INSERTION OF DIALYSIS CATHETER Right 12/24/2016   Procedure: INSERTION OF DIALYSIS CATHETER;  Surgeon: Waynetta Sandy, MD;  Location: [place];  Service: Vascular;  Laterality: Right;   INSERTION OF DIALYSIS CATHETER Right 02/04/2017   Procedure: INSERTION OF DIALYSIS CATHETER;  Surgeon: Waynetta Sandy, MD;  Location: [facility];  Service: Vascular;  Laterality: Right;   KIDNEY TRANSPLANT  1996   PERIPHERAL VASCULAR CATHETERIZATION Left 10/23/2016   Procedure: Fistulagram;  Surgeon: Elam Dutch, MD;  Location: [place] [facility];  Service: Cardiovascular;  Laterality: Left;   PERIPHERAL VASCULAR THROMBECTOMY Right 04/16/2020   Procedure: PERIPHERAL VASCULAR THROMBECTOMY;  Surgeon: Katha Cabal, MD;  Location: [place] CV [facility];  Service: Cardiovascular;  Laterality: Right;  RESECTION OF ARTERIOVENOUS FISTULA ANEURYSM Left 11/22/2015   Procedure: RESECTION OF LEFT RADIOCEPHALIC FISTULA ANEURYSM ;  Surgeon: Serafina Mitchell, MD;  Location: [place] OR;  Service: Vascular;  Laterality: Left;   REVISON OF ARTERIOVENOUS FISTULA Left 12/22/2016   Procedure: REVISON OF LEFT ARTERIOVENOUS FISTULA;  Surgeon: Waynetta Sandy, MD;  Location: [place];  Service: Vascular;  Laterality: Left;   REVISON OF ARTERIOVENOUS FISTULA Left 02/04/2017   Procedure: REVISON OF LEFT UPPER ARM ARTERIOVENOUS FISTULA;  Surgeon: Waynetta Sandy, MD;  Location: [place];  Service: Vascular;  Laterality: Left;   UPPER EXTREMITY ANGIOGRAPHY Bilateral  09/19/2019   Procedure: UPPER EXTREMITY ANGIOGRAPHY;  Surgeon: Katha Cabal, MD;  Location: [place] CV LAB;  Service: Cardiovascular;  Laterality: Bilateral;     OB History   No obstetric history on file.     Family History  Problem Relation Age of Onset   Colon cancer Brother    Cancer Brother    Coronary artery disease Mother 18   Hyperlipidemia Mother    Hypertension Mother    Stroke Maternal Aunt    Esophageal cancer Neg Hx    Stomach cancer Neg Hx    Rectal cancer Neg Hx     Social History   Tobacco Use   Smoking status: Former    Types: Cigarettes    Quit date: 12/31/1991    Years since quitting: 29.9   Smokeless tobacco: Never  Vaping Use   Vaping Use: Never used  Substance Use Topics   Alcohol use: No    Alcohol/week: 0.0 standard drinks   Drug use: No    Home Medications Prior to Admission medications   Medication Sig Start Date End Date Taking? Authorizing Provider  acetaminophen (TYLENOL) 500 MG tablet Take 1 tablet (500 mg total) by mouth every 6 (six) hours as needed. Patient taking differently: Take 1,000 mg by mouth every 6 (six) hours as needed for mild pain. 09/05/18   Law, Bea Graff, PA-C  albuterol (VENTOLIN HFA) 108 (90 Base) MCG/ACT inhaler Inhale 1-2 puffs into the lungs every 6 (six) hours as needed for wheezing or shortness of breath. 10/26/21   Jeanell Sparrow, DO  allopurinol (ZYLOPRIM) 300 MG tablet Take 300 mg by mouth daily. 11/03/21   [provider]  ALPRAZolam Duanne Moron) 0.25 MG tablet Take 1 tablet (0.25 mg total) by mouth daily as needed for anxiety. 08/18/21   Nita Sells, MD  atorvastatin (LIPITOR) 80 MG tablet TAKE 1 TABLET (80 MG TOTAL) BY MOUTH DAILY. Patient not taking: Reported on 11/08/2021 02/25/21 02/25/22  Dessa Phi, DO  cinacalcet (SENSIPAR) 30 MG tablet Take 30 mg by mouth 3 (three) times a week. Mon, Wed, Fri-- Given @ Dialysis 01/04/20   [provider]  folic acid-vitamin b  complex-vitamin c-selenium-zinc (DIALYVITE) 3 MG TABS tablet Take 1 tablet by mouth daily.    [provider]  losartan (COZAAR) 50 MG tablet Take 50 mg by mouth daily. 11/03/21   [provider]  ondansetron (ZOFRAN) 8 MG tablet Take 8 mg by mouth 2 (two) times daily as needed for nausea or vomiting.     [provider]  pantoprazole (PROTONIX) 40 MG tablet Take 1 tablet (40 mg total) by mouth daily. 08/18/21   Nita Sells, MD  zolpidem (AMBIEN) 10 MG tablet Take 10 mg by mouth at bedtime as needed for sleep. 02/13/21   [provider]    Allergies    Sulfa antibiotics and Adhesive [tape]  Review of Systems   Review of Systems  Constitutional:  Positive for fatigue. Negative for chills and fever.  HENT:  Negative for congestion, ear pain and sore throat.   Eyes:  Negative for pain and visual disturbance.  Respiratory:  Positive for cough and shortness of breath. Negative for choking, chest tightness and wheezing.   Cardiovascular:  Negative for chest pain, palpitations and leg swelling.  Gastrointestinal:  Positive for abdominal pain, diarrhea and nausea. Negative for vomiting.  Genitourinary:  Negative for flank pain and pelvic pain.       Anuric   Musculoskeletal:  Negative for arthralgias, back pain, joint swelling, myalgias and neck pain.  Skin:  Negative for color change and rash.  Neurological:  Negative for dizziness, seizures, syncope, weakness, light-headedness, numbness and headaches.  All other systems reviewed and are negative.  Physical Exam Updated Vital Signs BP (!) 129/48   Pulse 83   Temp 98.3 F (36.8 C)   Resp 14   SpO2 99%   Physical Exam Vitals and nursing note reviewed.  Constitutional:      General: She is not in acute distress.    Appearance: She is well-developed. She is ill-appearing (chronically). She is not toxic-appearing or diaphoretic.     Comments: somnolent  HENT:     Head: Normocephalic and  atraumatic.     Mouth/Throat:     Pharynx: Oropharynx is clear.  Eyes:     Extraocular Movements: Extraocular movements intact.     Conjunctiva/sclera: Conjunctivae normal.  Neck:     Vascular: JVD present.  Cardiovascular:     Rate and Rhythm: Normal rate and regular rhythm.     Heart sounds: No murmur heard. Pulmonary:     Effort: Pulmonary effort is normal. No tachypnea, accessory muscle usage or respiratory distress.     Breath sounds: Rales present. No decreased breath sounds, wheezing or rhonchi.  Chest:     Chest wall: No tenderness.  Abdominal:     Palpations: Abdomen is soft.     Tenderness: There is no abdominal tenderness.  Musculoskeletal:        General: No swelling.     Cervical back: Neck supple.     Right lower leg: No edema.     Left lower leg: No edema.  Skin:    General: Skin is warm and dry.     Capillary Refill: Capillary refill takes less than 2 seconds.     Coloration: Skin is not cyanotic or pale.  Neurological:     General: No focal deficit present.     Mental Status: She is alert and oriented to person, place, and time.     Cranial Nerves: No cranial nerve deficit.     Motor: No weakness.  Psychiatric:        Mood and Affect: Mood normal.    ED Results / Procedures / Treatments   Labs (all labs ordered are listed, but only abnormal results are displayed) Labs Reviewed  COMPREHENSIVE METABOLIC PANEL - Abnormal; Notable for the following components:      Result Value   Chloride 94 (*)    BUN 88 (*)    Creatinine, Ser 7.76 (*)    Calcium 7.7 (*)    Albumin 2.9 (*)    Alkaline Phosphatase 185 (*)    GFR, Estimated 5 (*)    Anion gap 19 (*)    All other components within normal limits  CBC WITH DIFFERENTIAL/PLATELET - Abnormal; Notable for the following components:  RBC 3.46 (*)    Hemoglobin 10.7 (*)    HCT 32.7 (*)    RDW 16.6 (*)    All other components within normal limits  BRAIN NATRIURETIC PEPTIDE - Abnormal; Notable for the  following components:   B Natriuretic Peptide >4,500.0 (*)    All other components within normal limits  I-STAT VENOUS BLOOD GAS, ED - Abnormal; Notable for the following components:   pH, Ven 7.575 (*)    pCO2, Ven 25.7 (*)    pO2, Ven 204.0 (*)    Acid-Base Excess 3.0 (*)    Sodium 132 (*)    Potassium 5.4 (*)    Calcium, Ion 0.71 (*)    All other components within normal limits  RESP PANEL BY RT-PCR (FLU A&B, COVID) [id number]  AMMONIA    EKG None  Radiology CT Abdomen Pelvis Wo Contrast  Result Date: 11/17/2021 CLINICAL DATA:  Abdominal pain, acute, nonlocalized lower abdominal pain, divertic in november resolved with abx, now with diarrhea, no fever, labs normal, abdomen benign, without IV due to ESRD EXAM: CT ABDOMEN AND PELVIS WITHOUT CONTRAST TECHNIQUE: Multidetector CT imaging of the abdomen and pelvis was performed following the standard protocol without IV contrast. COMPARISON:  CT 10/17/2021 FINDINGS: Lower chest: Patchy chronic micronodularity within the bilateral lung bases. No new consolidation within the included lung bases. No effusion. Cardiomegaly. No significant pericardial effusion. Hepatobiliary: Unenhanced liver without focal abnormality. Prior cholecystectomy. Pancreas: Grossly unremarkable. Spleen: Normal in size without focal abnormality. Adrenals/Urinary Tract: No focal abnormality of the adrenal glands. Chronic renal atrophy with left renal cyst. No hydronephrosis. Urinary bladder is decompressed, limiting its evaluation. Stomach/Bowel: Small hiatal hernia. Stomach appears within normal limits. No dilated loops of bowel to suggest obstruction. Colonic diverticulosis. No focal bowel wall thickening or inflammatory changes are evident. Evaluation of bowel pathology is somewhat limited in the absence of oral or intravenous contrast. Vascular/Lymphatic: Advanced atherosclerotic calcifications throughout the aortoiliac axis. 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 No lymphadenopathy is evident. Reproductive: Uterus and bilateral adnexa are unremarkable. Other: Trace perihepatic ascites. Chronic partially calcified hematoma at the anterolateral aspect of the left hemipelvis. Musculoskeletal: Chronic changes of renal osteodystrophy. Chronic degenerative and postsurgical changes at L4-5 where there is grade 2 anterolisthesis. No new or acute bony findings. IMPRESSION: 1. No acute abdominopelvic findings. 2. Colonic diverticulosis without evidence of acute diverticulitis. 3. Trace perihepatic ascites. 4. Additional chronic and/or incidental findings, as above. Aortic Atherosclerosis (ICD10-I70.0). Electronically Signed   By: Davina Poke D.O.   On: 11/17/2021 12:50   DG Chest 2 View  Result Date: 11/18/2021 CLINICAL DATA:  Dyspnea EXAM: CHEST - 2 VIEW COMPARISON:  11/07/2021 FINDINGS: Lungs are clear. No pneumothorax or pleural effusion. Mild cardiomegaly is stable. The thoracic aorta is tortuous and ectatic, but unchanged from prior examination. Pulmonary vascularity is normal. No acute bone abnormality. IMPRESSION: Stable cardiomegaly.  Resolved pulmonary edema. Electronically Signed   By: Fidela Salisbury M.D.   On: 11/18/2021 04:07    Procedures Procedures   Medications Ordered in ED Medications - No data to display  ED Course  I have reviewed the triage vital signs and the nursing notes.  Pertinent labs & imaging results that were available during my care of the patient were reviewed by me and considered in my medical decision making (see chart for details).    MDM Rules/Calculators/A&P  Patient is a 70 year old female with extensive medical history, presenting for shortness of breath.  She missed her dialysis session yesterday.  At present, he has been 4 days since she was last dialyzed.  On exam, SPO2 is normal on room air.  Breathing is unlabored.  No wheezes are appreciated on lung auscultation.  She is  somnolent, however, blood gas shows respiratory alkalosis.  Other laboratory work-up showed normal potassium, acute on chronic azotemia, and elevated anion gap.  I suspect that the anion gap is from the uremia.  Patient's BNP is elevated at > 4500.  I spoke with nephrology who agreed to schedule her for second shift dialysis here at the hospital.  While in the ED, patient stated that she would like to go home in order to free up the bed for someone else.  I did inform her that she would be able to get dialyzed today and she was agreeable to stay.  Patient was transported to dialysis units.  Patient will return to the ED for reassessment.    Final Clinical Impression(s) / ED Diagnoses Final diagnoses:  Shortness of breath    Rx / DC Orders ED Discharge Orders     None        Godfrey Pick, MD 11/18/21 1642

## 2021-11-18 NOTE — Discharge Instructions (Addendum)
You need to go to dialysis as scheduled.  See your nephrologist for follow-up  Return to ER if you have worse shortness of breath, chest pain, weakness

## 2021-11-18 NOTE — Progress Notes (Deleted)
No chief complaint on file.  History of Present Illness: 70 yo female with history of CAD (seen on chest CT, no prior cath), HTN, HLD, TIA, ESRD on HD, chronic diastolic CHF and anemia here today for cardiac follow up. She was hospitalized in April 2021 for placement of a RUE fistula and her post-surgical course was complicated by shock/hypotension and hyperkalemia resulting in sustained VT for which she was cardioverted to sinus. Echo at that time showed a new wall motion abnormality which was felt to be a possible stress cardiomyopathy in the setting of hypotension and recent surgery. She was subsequently admitted later that month with chest pain and SOB. Troponins were mildly elevated at that time to the 50s therefore an ischemic evaluation was deferred. Nuclear stress test March 2021 with no ischemia. Echo April 2021 with LVEF=50-55%, G1DD, distal anteroseptal and apical wall motion abnormality, moderate to severe TR. She was admitted to [facility] June 2021 with chest pain. Troponin elevated to 2900 but her chest pain resolved with fluid removal. She refused to consider a cardiac cath or cardiac CTA.   She is here today for follow up. The patient denies any chest pain, dyspnea, palpitations, lower extremity edema, orthopnea, PND, dizziness, near syncope or syncope.   Primary Care Physician: [name], MD  Past Medical History:  Diagnosis Date   Adenomatous polyp of colon 10/2010, 2006, 2015   Anemia in CKD (chronic kidney disease) 11/07/2012   s/p blood transfusion.    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  Had some early rejection then stable function for years, then had slow decline of function and went back on hemodialysis  in 2012.  Gets HD TTS schedule at [facility] on [facility] still using L forearm AVF.      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[place])    left upper arm   Pulmonary edema 12/2019   Stroke ([place]) 11/2015   TIA   Weight loss, unintentional     Past Surgical History:  Procedure Laterality Date   AV FISTULA PLACEMENT     for dialysis   AV FISTULA PLACEMENT Left 11/22/2015   Procedure: ARTERIOVENOUS (AV) FISTULA CREATION-LEFT BRACHIOCEPHALIC;  Surgeon: Serafina Mitchell, MD;  Location: [place];  Service: Vascular;  Laterality: Left;   AV FISTULA PLACEMENT Right 03/15/2020   Procedure: INSERTION OF ARTERIOVENOUS (AV) GORE-TEX GRAFT ARM ( BRACHIAL AXILLARY );  Surgeon: Katha Cabal, MD;  Location: [facility];  Service: Vascular;  Laterality: Right;   BACK SURGERY     CERVICAL FUSION     CHOLECYSTECTOMY  12/02/2012   Procedure: LAPAROSCOPIC CHOLECYSTECTOMY WITH INTRAOPERATIVE CHOLANGIOGRAM;  Surgeon: Edward Jolly, MD;  Location: [place];  Service: General;  Laterality: N/A;   EYE SURGERY Bilateral    cataract surgery   EYE SURGERY Left 2019   laser  HEMATOMA EVACUATION Left 12/24/2016   Procedure: EVACUATION HEMATOMA LEFT UPPER ARM;  Surgeon: Waynetta Sandy, MD;  Location: [place];  Service: Vascular;  Laterality: Left;   I & D EXTREMITY Left 12/31/2016   Procedure: IRRIGATION AND DEBRIDEMENT EXTREMITY;  Surgeon: Angelia Mould, MD;  Location: [place];  Service: Vascular;  Laterality: Left;   INSERTION OF DIALYSIS CATHETER Right 12/24/2016   Procedure: INSERTION OF DIALYSIS CATHETER;  Surgeon: Waynetta Sandy, MD;  Location: [place];  Service: Vascular;  Laterality: Right;   INSERTION OF DIALYSIS  CATHETER Right 02/04/2017   Procedure: INSERTION OF DIALYSIS CATHETER;  Surgeon: Waynetta Sandy, MD;  Location: [place];  Service: Vascular;  Laterality: Right;   [place] Left 10/23/2016   Procedure: Fistulagram;  Surgeon: Elam Dutch, MD;  Location: [place] [facility];  Service: Cardiovascular;  Laterality: Left;   PERIPHERAL VASCULAR THROMBECTOMY Right 04/16/2020   Procedure: PERIPHERAL VASCULAR THROMBECTOMY;  Surgeon: Katha Cabal, MD;  Location: [place] [facility];  Service: Cardiovascular;  Laterality: Right;   RESECTION OF ARTERIOVENOUS FISTULA ANEURYSM Left 11/22/2015   Procedure: RESECTION OF LEFT RADIOCEPHALIC FISTULA ANEURYSM ;  Surgeon: Serafina Mitchell, MD;  Location: [place] [facility];  Service: Vascular;  Laterality: Left;   REVISON OF ARTERIOVENOUS FISTULA Left 12/22/2016   Procedure: REVISON OF LEFT ARTERIOVENOUS FISTULA;  Surgeon: Waynetta Sandy, MD;  Location: [place];  Service: Vascular;  Laterality: Left;   REVISON OF ARTERIOVENOUS FISTULA Left 02/04/2017   Procedure: REVISON OF LEFT UPPER ARM ARTERIOVENOUS FISTULA;  Surgeon: Waynetta Sandy, MD;  Location: [place];  Service: Vascular;  Laterality: Left;   UPPER EXTREMITY ANGIOGRAPHY Bilateral 09/19/2019   Procedure: UPPER EXTREMITY ANGIOGRAPHY;  Surgeon: Katha Cabal, MD;  Location: [place] [facility];  Service: Cardiovascular;  Laterality: Bilateral;    Current Outpatient Medications  Medication Sig Dispense Refill   acetaminophen (TYLENOL) 500 MG tablet Take 1 tablet (500 mg total) by mouth every 6 (six) hours as needed. (Patient taking differently: Take 1,000 mg by mouth every 6 (six) hours as needed for mild pain.) 30 tablet 0   albuterol (VENTOLIN HFA) 108 (90 Base) MCG/ACT inhaler Inhale 1-2 puffs into the lungs every 6 (six) hours as needed for wheezing or shortness of breath. 6.7 g 0   allopurinol (ZYLOPRIM) 300 MG tablet Take 300 mg by  mouth daily.     ALPRAZolam (XANAX) 0.25 MG tablet Take 1 tablet (0.25 mg total) by mouth daily as needed for anxiety. 10 tablet 0   atorvastatin (LIPITOR) 80 MG tablet TAKE 1 TABLET (80 MG TOTAL) BY MOUTH DAILY. (Patient not taking: Reported on 11/08/2021) 30 tablet 0   cinacalcet (SENSIPAR) 30 MG tablet Take 30 mg by mouth 3 (three) times a week. Mon, Wed, Fri-- Given @ Dialysis     folic acid-vitamin b complex-vitamin c-selenium-zinc (DIALYVITE) 3 MG TABS tablet Take 1 tablet by mouth daily.     losartan (COZAAR) 50 MG tablet Take 50 mg by mouth daily.     ondansetron (ZOFRAN) 8 MG tablet Take 8 mg by mouth 2 (two) times daily as needed for nausea or vomiting.   0   pantoprazole (PROTONIX) 40 MG tablet Take 1 tablet (40 mg total) by mouth daily. 60 tablet    zolpidem (AMBIEN) 10 MG tablet Take 10 mg by mouth at bedtime as needed for sleep.     No current facility-administered medications for this visit.  Allergies  Allergen Reactions   Sulfa Antibiotics Other (See Comments)    Per patient, both parents allergic-so will not take   Adhesive [Tape] Itching    Social History   Socioeconomic History   Marital status: Widowed    Spouse name: Not on file   Number of children: 2   Years of education: Not on file   Highest education level: Not on file  Occupational History   Not on file  Tobacco Use   Smoking status: Former    Types: Cigarettes    Quit date: 12/31/1991    Years since quitting: 29.9   Smokeless tobacco: Never  Vaping Use   Vaping Use: Never used  Substance and Sexual Activity   Alcohol use: No    Alcohol/week: 0.0 standard drinks   Drug use: No   Sexual activity: Not Currently  Other Topics Concern   Not on file  Social History Narrative   Living with her mother    Right handed   Caffeine: only decaf clears ("no brown sodas" or coffee d/t kidney disease)   Low potassium foods d/t kidney disease   Social Determinants of Health   Financial Resource  Strain: Not on file  Food Insecurity: Not on file  Transportation Needs: Not on file  Physical Activity: Not on file  Stress: Not on file  Social Connections: Not on file  Intimate Partner Violence: Not on file    Family History  Problem Relation Age of Onset   Colon cancer Brother    Cancer Brother    Coronary artery disease Mother 67   Hyperlipidemia Mother    Hypertension Mother    Stroke Maternal Aunt    Esophageal cancer Neg Hx    Stomach cancer Neg Hx    Rectal cancer Neg Hx     Review of Systems:  As stated in the HPI and otherwise negative.   There were no vitals taken for this visit.  NO blood pressure reading due to inability to use arms for her BP (HD access). Unable to obtain pressure reading in her legs.   Physical Examination:  General: Well developed, well nourished, NAD  HEENT: OP clear, mucus membranes moist  SKIN: warm, dry. No rashes. Neuro: No focal deficits  Musculoskeletal: Muscle strength 5/5 all ext  Psychiatric: Mood and affect normal  Neck: No JVD, no carotid bruits, no thyromegaly, no lymphadenopathy.  Lungs:Clear bilaterally, no wheezes, rhonci, crackles Cardiovascular: Regular rate and rhythm. No murmurs, gallops or rubs. Abdomen:Soft. Bowel sounds present. Non-tender.  Extremities: No lower extremity edema. Pulses are 2 + in the bilateral DP/PT.  EKG:  EKG is not *** ordered today. The ekg ordered today demonstrates   Recent Labs: 11/08/2021: B Natriuretic Peptide >4,500.0; Magnesium 2.2 11/18/2021: ALT 15; BUN 88; Creatinine, Ser 7.76; Hemoglobin 12.6; Platelets 311; Potassium 5.4; Sodium 132   Lipid Panel    Component Value Date/Time   CHOL 191 02/22/2021 0249   TRIG 87 02/22/2021 0249   HDL 57 02/22/2021 0249   CHOLHDL 3.4 02/22/2021 0249   VLDL 17 02/22/2021 0249   LDLCALC 117 (H) 02/22/2021 0249     Wt Readings from Last 3 Encounters:  11/17/21 125 lb (56.7 kg)  11/11/21 106 lb 14.8 oz (48.5 kg)  11/07/21 104 lb (47.2 kg)      Assessment and Plan:   1. CAD without angina: Evidence of coronary artery disease on chest CT in December 2020. No ischemia on stress test in March 2021. Her probability  of CAD is high but she has no worrisome symptoms. Continue ASA, statin and beta blocker.    2. HTN: BP has been controlled.   Current medicines are reviewed at length with the patient today.  The patient does not have concerns regarding medicines.  The following changes have been made:  no change  Labs/ tests ordered today include:  No orders of the defined types were placed in this encounter.    Disposition:   F/U with me in 12 months   Signed, Lauree Chandler, MD 11/18/2021 8:43 AM    [facility] [address], [place], [place]  [address] Phone: [phone]; Fax: [phone]

## 2021-11-18 NOTE — ED Provider Notes (Signed)
  Physical Exam  BP (!) 113/54   Pulse 84   Temp 98 F (36.7 C) (Oral)   Resp 20   SpO2 92%   Physical Exam  ED Course/Procedures     Procedures  MDM  Patient came down from dialysis.  Patient was seen earlier for shortness of breath secondary to missing dialysis.  Patient is now back from dialysis and oxygen level is normal.  Lungs are clear now.  She states that she feels fine.  Stable for discharge   Drenda Freeze, MD 11/18/21 1733

## 2021-11-18 NOTE — ED Triage Notes (Signed)
Pt presents to ED BIB [facility]. Pt c/o SOB and cough for a few weeks. Pt reports feeling warm at home but no fever. Pt reports that she missed dialysis today.

## 2021-11-18 NOTE — Progress Notes (Signed)
Hemodialysis- Arrived from ed via stretcher. Consent confirmed. Patient is short of breath with exertion. Currently requests oxygen. Placed on 1L [place] for comfort. Sats remain >94%. All labs and orders reviewed.

## 2021-11-18 NOTE — Progress Notes (Signed)
Hemodialysis- Tolerated well without issue. UF goal 2L met. Report called to Adam in ED. Patient to be discharged post HD per renal PA.

## 2021-11-18 NOTE — ED Notes (Signed)
Changed patient brief patient is resting with call bell in reach

## 2021-11-20 ENCOUNTER — Emergency Department (HOSPITAL_COMMUNITY)
Admission: EM | Admit: 2021-11-20 | Discharge: 2021-11-20 | Disposition: A | Discharge status: Home / Self Care | Payer: Medicare HMO | Attending: Emergency Medicine | Admitting: Emergency Medicine

## 2021-11-20 ENCOUNTER — Encounter (HOSPITAL_COMMUNITY): Payer: Self-pay | Admitting: Emergency Medicine

## 2021-11-20 ENCOUNTER — Emergency Department (HOSPITAL_COMMUNITY): Payer: Medicare HMO

## 2021-11-20 DIAGNOSIS — N186 End stage renal disease: Secondary | ICD-10-CM | POA: Diagnosis not present

## 2021-11-20 DIAGNOSIS — Z79899 Other long term (current) drug therapy: Secondary | ICD-10-CM | POA: Insufficient documentation

## 2021-11-20 DIAGNOSIS — I132 Hypertensive heart and chronic kidney disease with heart failure and with stage 5 chronic kidney disease, or end stage renal disease: Secondary | ICD-10-CM | POA: Insufficient documentation

## 2021-11-20 DIAGNOSIS — Z940 Kidney transplant status: Secondary | ICD-10-CM | POA: Insufficient documentation

## 2021-11-20 DIAGNOSIS — E1122 Type 2 diabetes mellitus with diabetic chronic kidney disease: Secondary | ICD-10-CM | POA: Diagnosis not present

## 2021-11-20 DIAGNOSIS — R0602 Shortness of breath: Secondary | ICD-10-CM | POA: Insufficient documentation

## 2021-11-20 DIAGNOSIS — Z87891 Personal history of nicotine dependence: Secondary | ICD-10-CM | POA: Insufficient documentation

## 2021-11-20 DIAGNOSIS — I5033 Acute on chronic diastolic (congestive) heart failure: Secondary | ICD-10-CM | POA: Insufficient documentation

## 2021-11-20 DIAGNOSIS — I2510 Atherosclerotic heart disease of native coronary artery without angina pectoris: Secondary | ICD-10-CM | POA: Diagnosis not present

## 2021-11-20 DIAGNOSIS — I10 Essential (primary) hypertension: Secondary | ICD-10-CM | POA: Diagnosis not present

## 2021-11-20 LAB — CBC
HCT: 31 % — ABNORMAL LOW (ref 36.0–46.0)
Hemoglobin: 9.4 g/dL — ABNORMAL LOW (ref 12.0–15.0)
MCH: 29.5 pg (ref 26.0–34.0)
MCHC: 30.3 g/dL (ref 30.0–36.0)
MCV: 97.2 fL (ref 80.0–100.0)
Platelets: 284 10*3/uL (ref 150–400)
RBC: 3.19 MIL/uL — ABNORMAL LOW (ref 3.87–5.11)
RDW: 16.4 % — ABNORMAL HIGH (ref 11.5–15.5)
WBC: 7.6 10*3/uL (ref 4.0–10.5)
nRBC: 0 % (ref 0.0–0.2)

## 2021-11-20 LAB — BASIC METABOLIC PANEL
Anion gap: 15 (ref 5–15)
BUN: 59 mg/dL — ABNORMAL HIGH (ref 8–23)
CO2: 24 mmol/L (ref 22–32)
Calcium: 7.7 mg/dL — ABNORMAL LOW (ref 8.9–10.3)
Chloride: 97 mmol/L — ABNORMAL LOW (ref 98–111)
Creatinine, Ser: 6.22 mg/dL — ABNORMAL HIGH (ref 0.44–1.00)
GFR, Estimated: 7 mL/min — ABNORMAL LOW (ref >60)
Glucose, Bld: 74 mg/dL (ref 70–99)
Potassium: 3.6 mmol/L (ref 3.5–5.1)
Sodium: 136 mmol/L (ref 135–145)

## 2021-11-20 LAB — TROPONIN I (HIGH SENSITIVITY)
Troponin I (High Sensitivity): 43 ng/L — ABNORMAL HIGH (ref <18)
Troponin I (High Sensitivity): 36 ng/L — ABNORMAL HIGH (ref <18)

## 2021-11-20 NOTE — ED Provider Notes (Signed)
[facility] EMERGENCY DEPARTMENT Provider Note   CSN: [id number] Arrival date & time: 11/20/21  4174     History Chief Complaint  Patient presents with   Shortness of Breath    [name] is a 70 y.o. female.  Presented to the ER with concern for shortness of breath.  Patient reports that she was having some shortness of breath yesterday and when she woke up this morning felt more short of breath.  Contacted EMS, was reportedly placed on 2 L nasal cannula per EMS for comfort.  No hypoxia with EMS.  Since being in the emergency room, patient states that her shortness of breath is since resolved.  She denies any associated chest pain, denies any ongoing pain or difficulty breathing right now.  States that she does feel somewhat tired and fatigued.  Reports that she gets dialysis Monday Wednesday Friday, states that she got her session yesterday and is due to go back tomorrow.  In triage, patient also commented on left foot pain.  Pain ongoing for several months and reported to triage nurse that she wanted this evaluated.  She states that a few weeks ago she had a boil on her foot that has since resolved.  States that the foot is generally aching.  She denies any trauma.  No numbness or weakness to this extremity.  No redness.  No fever.  HPI     Past Medical History:  Diagnosis Date   Adenomatous polyp of colon 10/2010, 2006, 2015   Anemia in CKD (chronic kidney disease) 11/07/2012   s/p blood transfusion.    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  Had some early rejection then stable function for years, then had slow decline of function and went back on hemodialysis in 2012.  Gets HD TTS schedule  at [facility] on [facility] still using L forearm AVF.      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[place])    left upper arm   Pulmonary edema 12/2019   Stroke ([place]) 11/2015   TIA   Weight loss, unintentional     Patient Active Problem List   Diagnosis Date Noted   Acute on chronic diastolic (congestive) heart failure ([place]) 11/08/2021   GERD (gastroesophageal reflux disease)    Protein-calorie malnutrition, severe 08/12/2021   Diverticulitis of colon with perforation 08/06/2021   Acute respiratory failure with hypoxia ([place]) 08/06/2021   Major depressive disorder, recurrent episode, moderate ([place]) 02/23/2021   TIA (transient ischemic attack) 02/21/2021   Prolonged QT interval 02/21/2021   NSTEMI (non-ST elevated myocardial infarction) ([place]) 05/14/2020   Volume overload 05/13/2020   Hypotension after procedure 03/16/2020   Chest pain 03/16/2020   Change in mental state 03/15/2020   Ventricular tachycardia 03/15/2020   Seizure ([place]) 03/15/2020   Unresponsive episode 03/15/2020   Pulmonary edema  23/55/7322   Complication of vascular access for dialysis 12/21/2019   Breakdown (mechanical) of surgically created arteriovenous fistula, initial encounter ([place]) 12/18/2019   Weakness 10/11/2019   Aneurysm artery, 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   Dependence on renal dialysis ([place]) 07/24/2019   Iron deficiency anemia, unspecified 06/09/2019   Age-related osteoporosis without current pathological fracture 04/17/2019   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   Coagulation defect, unspecified ([place]) 04/17/2019   Diarrhea, unspecified 04/17/2019   Encounter for screening for depression 04/17/2019    Headache, unspecified 04/17/2019   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   Pain, unspecified 04/17/2019   Polyp of colon 04/17/2019   Primary generalized (osteo)arthritis 04/17/2019   Pruritus, unspecified 04/17/2019   Pure hypercholesterolemia, unspecified 04/17/2019   Secondary hyperparathyroidism of renal origin ([place]) 04/17/2019   Gastro-esophageal reflux disease without esophagitis 04/17/2019   Essential (primary) hypertension 04/17/2019   Hyperlipidemia, unspecified 04/17/2019   Transient cerebral ischemic attack, unspecified 04/17/2019   Hypoxemia 12/13/2018   Prolonged Q-T interval on ECG 02/24/2017   Hypokalemia 02/24/2017   Acute ischemic stroke ([place]) 02/23/2017   Malnutrition of moderate degree 12/24/2016   Problem with dialysis access ([place]) 12/21/2016   Acute ischemic colitis ([place]) 05/16/2016   Colitis 05/15/2016   Rectal bleeding 05/15/2016   Neurologic abnormality 11/19/2015   Hyperkalemia 11/19/2015   Anxiety 11/19/2015   Insomnia 11/19/2015   Gait instability    Stroke-like symptom 11/16/2015   Vestibular neuritis 11/16/2015   Stroke (cerebrum) ([place]) 11/16/2015   Dizziness 05/09/2015   Ataxia 05/09/2015   H/O: CVA (cerebrovascular accident) 05/09/2015   Left facial numbness 05/09/2015   Left leg numbness 05/09/2015   Hyperlipidemia    Acute on chronic diastolic CHF (congestive heart failure) ([place])    SOB (shortness of breath) 04/01/2013   HTN (hypertension) 04/01/2013   Cholecystitis, acute 11/07/2012   End-stage renal disease on hemodialysis ([place]) 11/07/2012   Anemia in chronic kidney disease 11/07/2012   Dyspnea 12/31/2011    Past Surgical History:  Procedure Laterality Date   AV FISTULA PLACEMENT     for dialysis   AV FISTULA PLACEMENT Left 11/22/2015   Procedure: ARTERIOVENOUS (AV) FISTULA CREATION-LEFT BRACHIOCEPHALIC;  Surgeon: Serafina Mitchell, MD;  Location: [place];  Service: Vascular;  Laterality: Left;   AV FISTULA PLACEMENT Right 03/15/2020    Procedure: INSERTION OF ARTERIOVENOUS (AV) GORE-TEX GRAFT ARM ( BRACHIAL AXILLARY );  Surgeon: Katha Cabal, MD;  Location: [facility];  Service: Vascular;  Laterality: Right;   BACK SURGERY     CERVICAL FUSION     CHOLECYSTECTOMY  12/02/2012   Procedure: LAPAROSCOPIC CHOLECYSTECTOMY WITH INTRAOPERATIVE CHOLANGIOGRAM;  Surgeon: Edward Jolly, MD;  Location: [place] [facility];  Service: General;  Laterality: N/A;   EYE SURGERY Bilateral    cataract surgery   EYE SURGERY Left 2019   laser   HEMATOMA EVACUATION Left 12/24/2016   Procedure: EVACUATION HEMATOMA LEFT UPPER ARM;  Surgeon: Waynetta Sandy, MD;  Location: [place];  Service: Vascular;  Laterality: Left;   I & D EXTREMITY Left 12/31/2016   Procedure: IRRIGATION AND DEBRIDEMENT EXTREMITY;  Surgeon: Angelia Mould, MD;  Location: [place];  Service: Vascular;  Laterality: Left;   INSERTION OF DIALYSIS CATHETER Right 12/24/2016   Procedure: INSERTION OF DIALYSIS CATHETER;  Surgeon: Waynetta Sandy, MD;  Location: [place];  Service: Vascular;  Laterality: Right;   INSERTION OF DIALYSIS CATHETER Right 02/04/2017   Procedure: INSERTION OF DIALYSIS CATHETER;  Surgeon: Waynetta Sandy, MD;  Location: [facility];  Service: Vascular;  Laterality: Right;   [place] Left 10/23/2016   Procedure: Fistulagram;  Surgeon: Elam Dutch, MD;  Location: [place] [facility];  Service: Cardiovascular;  Laterality: Left;   PERIPHERAL VASCULAR THROMBECTOMY Right 04/16/2020   Procedure: PERIPHERAL VASCULAR THROMBECTOMY;  Surgeon: Katha Cabal, MD;  Location: [place] [facility];  Service: Cardiovascular;  Laterality: Right;   RESECTION OF ARTERIOVENOUS FISTULA ANEURYSM Left 11/22/2015   Procedure: RESECTION OF LEFT RADIOCEPHALIC FISTULA ANEURYSM ;  Surgeon: Serafina Mitchell, MD;  Location: [place] [facility];  Service: Vascular;  Laterality: Left;   REVISON OF ARTERIOVENOUS FISTULA Left 12/22/2016    Procedure: REVISON OF LEFT ARTERIOVENOUS FISTULA;  Surgeon: Waynetta Sandy, MD;  Location: [place];  Service: Vascular;  Laterality: Left;   REVISON OF ARTERIOVENOUS FISTULA Left 02/04/2017   Procedure: REVISON OF LEFT UPPER ARM ARTERIOVENOUS FISTULA;  Surgeon: Waynetta Sandy, MD;  Location: [place];  Service: Vascular;  Laterality: Left;   UPPER EXTREMITY ANGIOGRAPHY Bilateral 09/19/2019   Procedure: UPPER EXTREMITY ANGIOGRAPHY;  Surgeon: Katha Cabal, MD;  Location: [place] [facility];  Service: Cardiovascular;  Laterality: Bilateral;     OB History   No obstetric history on file.     Family History  Problem Relation Age of Onset   Colon cancer Brother    Cancer Brother    Coronary artery disease Mother 60   Hyperlipidemia Mother    Hypertension Mother    Stroke Maternal Aunt    Esophageal cancer Neg Hx    Stomach cancer Neg Hx    Rectal cancer Neg Hx     Social History   Tobacco Use   Smoking status: Former    Types: Cigarettes    Quit date: 12/31/1991    Years since quitting: 29.9   Smokeless tobacco: Never  Vaping Use   Vaping Use: Never used  Substance Use Topics   Alcohol use: No    Alcohol/week: 0.0 standard drinks   Drug use: No    Home Medications Prior to Admission medications   Medication Sig Start Date End Date Taking? Authorizing Provider  acetaminophen (TYLENOL) 500 MG tablet Take 1 tablet (500 mg total) by mouth every 6 (six) hours as needed. Patient taking differently: Take 1,000 mg by mouth every 6 (six) hours as needed for mild pain. 09/05/18   Law, Bea Graff, PA-C  albuterol (VENTOLIN HFA) 108 (90 Base) MCG/ACT inhaler Inhale 1-2 puffs into the lungs every 6 (six) hours as needed for wheezing or shortness of breath. 10/26/21   Jeanell Sparrow, DO  allopurinol (ZYLOPRIM) 300 MG tablet Take 300 mg by mouth daily. 11/03/21   [provider]  ALPRAZolam Duanne Moron) 0.25 MG tablet Take 1 tablet (0.25 mg total) by mouth daily as  needed for anxiety. 08/18/21   Nita Sells, MD  atorvastatin (LIPITOR) 80 MG tablet TAKE 1 TABLET (80 MG TOTAL) BY MOUTH DAILY. Patient not taking: Reported on 11/08/2021 02/25/21 02/25/22  Dessa Phi, DO  cinacalcet (SENSIPAR) 30 MG tablet Take 30 mg by mouth 3 (three) times a week. Mon, Wed, Fri-- Given @ Dialysis 01/04/20   [provider]  folic acid-vitamin b complex-vitamin c-selenium-zinc (DIALYVITE) 3 MG TABS tablet Take 1 tablet by mouth daily.    [provider]  losartan (COZAAR) 50 MG tablet Take 50 mg by mouth daily. 11/03/21   [provider]  ondansetron (ZOFRAN) 8 MG tablet Take  8 mg by mouth 2 (two) times daily as needed for nausea or vomiting.     [provider]  pantoprazole (PROTONIX) 40 MG tablet Take 1 tablet (40 mg total) by mouth daily. 08/18/21   Nita Sells, MD  zolpidem (AMBIEN) 10 MG tablet Take 10 mg by mouth at bedtime as needed for sleep. 02/13/21   [provider]    Allergies    Sulfa antibiotics and Adhesive [tape]  Review of Systems   Review of Systems  Constitutional:  Negative for chills and fever.  HENT:  Negative for ear pain and sore throat.   Eyes:  Negative for pain and visual disturbance.  Respiratory:  Positive for shortness of breath. Negative for cough.   Cardiovascular:  Negative for chest pain and palpitations.  Gastrointestinal:  Negative for abdominal pain and vomiting.  Genitourinary:  Negative for dysuria and hematuria.  Musculoskeletal:  Negative for arthralgias and back pain.  Skin:  Negative for color change and rash.  Neurological:  Negative for seizures and syncope.  All other systems reviewed and are negative.  Physical Exam Updated Vital Signs BP (!) 143/69   Pulse 87   Temp 98.2 F (36.8 C)   Resp 16   SpO2 98%   Physical Exam Vitals and nursing note reviewed.  Constitutional:      General: She is not in acute distress.    Appearance: She is well-developed.   HENT:     Head: Normocephalic and atraumatic.  Eyes:     Conjunctiva/sclera: Conjunctivae normal.  Cardiovascular:     Rate and Rhythm: Normal rate and regular rhythm.     Heart sounds: No murmur heard. Pulmonary:     Effort: Pulmonary effort is normal. No respiratory distress.     Breath sounds: Normal breath sounds.  Abdominal:     Palpations: Abdomen is soft.     Tenderness: There is no abdominal tenderness.  Musculoskeletal:        General: No swelling.     Cervical back: Neck supple.     Comments: Left lower extremity: There is small healing wound approximately 1 cm in diameter over the dorsal aspect of the midfoot, no significant surrounding erythema, foot is warm and well-perfused, normal PT pulse, sensation and motor intact  Skin:    General: Skin is warm and dry.     Capillary Refill: Capillary refill takes less than 2 seconds.  Neurological:     Mental Status: She is alert.  Psychiatric:        Mood and Affect: Mood normal.    ED Results / Procedures / Treatments   Labs (all labs ordered are listed, but only abnormal results are displayed) Labs Reviewed  BASIC METABOLIC PANEL - Abnormal; Notable for the following components:      Result Value   Chloride 97 (*)    BUN 59 (*)    Creatinine, Ser 6.22 (*)    Calcium 7.7 (*)    GFR, Estimated 7 (*)    All other components within normal limits  CBC - Abnormal; Notable for the following components:   RBC 3.19 (*)    Hemoglobin 9.4 (*)    HCT 31.0 (*)    RDW 16.4 (*)    All other components within normal limits  TROPONIN I (HIGH SENSITIVITY) - Abnormal; Notable for the following components:   Troponin I (High Sensitivity) 43 (*)    All other components within normal limits  TROPONIN I (HIGH SENSITIVITY)  EKG None  Radiology DG Chest 2 View  Result Date: 11/20/2021 CLINICAL DATA:  Shortness of breath EXAM: CHEST - 2 VIEW COMPARISON:  Chest radiograph 11/18/2021 FINDINGS: The heart is enlarged, unchanged.  The mediastinal contours are stable. There is worsened vascular congestion without overt pulmonary edema. The pulmonary arteries are prominent suggesting pulmonary hypertension. A cluster of small nodular opacities in the right upper lobe are unchanged, chronic. There is no new focal consolidation. There is no pleural effusion or pneumothorax. The bones are stable. IMPRESSION: Slightly worsened vascular congestion without definite overt pulmonary edema since 11/18/2021. No focal consolidation or pleural effusion. Electronically Signed   By: Valetta Mole M.D.   On: 11/20/2021 07:59    Procedures Procedures   Medications Ordered in ED Medications - No data to display  ED Course  I have reviewed the triage vital signs and the nursing notes.  Pertinent labs & imaging results that were available during my care of the patient were reviewed by me and considered in my medical decision making (see chart for details).    MDM Rules/Calculators/A&P                           70 year old lady presents to ER with concern for shortness of breath.  ESRD.  Recent visit with hypoxia that resolved after getting dialysis.  Her basic labs are stable.  Chest x-ray with slightly worsened vascular congestion but no overt pulmonary edema.  She currently does not have any hypoxia.  Her basic lab work was stable.  Troponin slightly worse than prior, no chest pain and no EKG changes.  We will repeat troponin.  Regarding the secondary complaint of left foot pain, neurovascular intact, does have small healing wound.  No evidence of active infection at present, no trauma, recommend following up with her primary doctor about this.  While awaiting further observation, repeat troponin, signed out to Dr. Tomi Bamberger.   Final Clinical Impression(s) / ED Diagnoses Final diagnoses:  SOB (shortness of breath)  ESRD (end stage renal disease) ([place])    Rx / DC Orders ED Discharge Orders     None        Lucrezia Starch,  MD 11/20/21 1643

## 2021-11-20 NOTE — ED Notes (Signed)
Pt verbalizes understanding of discharge instructions. Opportunity for questions and answers were provided. Pt discharged from the ED.   ?

## 2021-11-20 NOTE — Discharge Instructions (Signed)
Continue your current medications.  Follow-up with your primary care doctor.

## 2021-11-20 NOTE — ED Triage Notes (Addendum)
Patient BIB [facility] from home for complaint of shortness of breath that she states started this morning. Placed on 2L for comfort PTA by EMS. Room air SpO2 100% in ED.History of dialysis and reports no missed treatments. Patient alert, oriented, and in no apparent distress at this time.  Patient also states her left foot has been hurting for several months and wants that evaluated.

## 2021-11-20 NOTE — ED Provider Notes (Signed)
Patient initially seen by Dr. Roslynn Amble.  Please see his note.  Plan was to follow-up on her laboratory tests.  Initial troponin was slightly elevated however patient does have history of elevated troponins and the second troponin was lower than the first.  Similar to previous values.  No signs of pneumonia.  No significant pulmonary edema.  Patient appears stable for discharge   Dorie Rank, MD 11/20/21 1836

## 2021-11-21 DIAGNOSIS — Z992 Dependence on renal dialysis: Secondary | ICD-10-CM | POA: Diagnosis not present

## 2021-11-21 DIAGNOSIS — N186 End stage renal disease: Secondary | ICD-10-CM | POA: Diagnosis not present

## 2021-11-21 DIAGNOSIS — N2581 Secondary hyperparathyroidism of renal origin: Secondary | ICD-10-CM | POA: Diagnosis not present

## 2021-11-24 DIAGNOSIS — Z992 Dependence on renal dialysis: Secondary | ICD-10-CM | POA: Diagnosis not present

## 2021-11-24 DIAGNOSIS — N186 End stage renal disease: Secondary | ICD-10-CM | POA: Diagnosis not present

## 2021-11-24 DIAGNOSIS — N2581 Secondary hyperparathyroidism of renal origin: Secondary | ICD-10-CM | POA: Diagnosis not present

## 2021-11-25 DIAGNOSIS — I722 Aneurysm of renal artery: Secondary | ICD-10-CM | POA: Diagnosis not present

## 2021-11-25 DIAGNOSIS — K219 Gastro-esophageal reflux disease without esophagitis: Secondary | ICD-10-CM | POA: Diagnosis not present

## 2021-11-25 DIAGNOSIS — N186 End stage renal disease: Secondary | ICD-10-CM | POA: Diagnosis not present

## 2021-11-25 DIAGNOSIS — I5032 Chronic diastolic (congestive) heart failure: Secondary | ICD-10-CM | POA: Diagnosis not present

## 2021-11-25 DIAGNOSIS — F419 Anxiety disorder, unspecified: Secondary | ICD-10-CM | POA: Diagnosis not present

## 2021-11-25 DIAGNOSIS — I480 Paroxysmal atrial fibrillation: Secondary | ICD-10-CM | POA: Diagnosis not present

## 2021-11-25 DIAGNOSIS — I132 Hypertensive heart and chronic kidney disease with heart failure and with stage 5 chronic kidney disease, or end stage renal disease: Secondary | ICD-10-CM | POA: Diagnosis not present

## 2021-11-25 DIAGNOSIS — E785 Hyperlipidemia, unspecified: Secondary | ICD-10-CM | POA: Diagnosis not present

## 2021-11-25 DIAGNOSIS — K5720 Diverticulitis of large intestine with perforation and abscess without bleeding: Secondary | ICD-10-CM | POA: Diagnosis not present

## 2021-11-26 DIAGNOSIS — F418 Other specified anxiety disorders: Secondary | ICD-10-CM | POA: Diagnosis not present

## 2021-11-26 DIAGNOSIS — Z992 Dependence on renal dialysis: Secondary | ICD-10-CM | POA: Diagnosis not present

## 2021-11-26 DIAGNOSIS — I132 Hypertensive heart and chronic kidney disease with heart failure and with stage 5 chronic kidney disease, or end stage renal disease: Secondary | ICD-10-CM | POA: Diagnosis not present

## 2021-11-26 DIAGNOSIS — K219 Gastro-esophageal reflux disease without esophagitis: Secondary | ICD-10-CM | POA: Diagnosis not present

## 2021-11-26 DIAGNOSIS — I722 Aneurysm of renal artery: Secondary | ICD-10-CM | POA: Diagnosis not present

## 2021-11-26 DIAGNOSIS — E43 Unspecified severe protein-calorie malnutrition: Secondary | ICD-10-CM | POA: Diagnosis not present

## 2021-11-26 DIAGNOSIS — N2581 Secondary hyperparathyroidism of renal origin: Secondary | ICD-10-CM | POA: Diagnosis not present

## 2021-11-26 DIAGNOSIS — I5033 Acute on chronic diastolic (congestive) heart failure: Secondary | ICD-10-CM | POA: Diagnosis not present

## 2021-11-26 DIAGNOSIS — D631 Anemia in chronic kidney disease: Secondary | ICD-10-CM | POA: Diagnosis not present

## 2021-11-26 DIAGNOSIS — E7800 Pure hypercholesterolemia, unspecified: Secondary | ICD-10-CM | POA: Diagnosis not present

## 2021-11-26 DIAGNOSIS — N186 End stage renal disease: Secondary | ICD-10-CM | POA: Diagnosis not present

## 2021-11-27 ENCOUNTER — Other Ambulatory Visit: Payer: Self-pay

## 2021-11-27 ENCOUNTER — Emergency Department (HOSPITAL_COMMUNITY)
Admission: EM | Admit: 2021-11-27 | Discharge: 2021-11-28 | Disposition: A | Discharge status: Home / Self Care | Payer: Medicare HMO | Attending: Emergency Medicine | Admitting: Emergency Medicine

## 2021-11-27 ENCOUNTER — Emergency Department (HOSPITAL_COMMUNITY): Payer: Medicare HMO

## 2021-11-27 ENCOUNTER — Encounter (HOSPITAL_COMMUNITY): Payer: Self-pay

## 2021-11-27 DIAGNOSIS — I132 Hypertensive heart and chronic kidney disease with heart failure and with stage 5 chronic kidney disease, or end stage renal disease: Secondary | ICD-10-CM | POA: Insufficient documentation

## 2021-11-27 DIAGNOSIS — L03116 Cellulitis of left lower limb: Secondary | ICD-10-CM | POA: Insufficient documentation

## 2021-11-27 DIAGNOSIS — Z992 Dependence on renal dialysis: Secondary | ICD-10-CM | POA: Diagnosis not present

## 2021-11-27 DIAGNOSIS — I2510 Atherosclerotic heart disease of native coronary artery without angina pectoris: Secondary | ICD-10-CM | POA: Diagnosis not present

## 2021-11-27 DIAGNOSIS — N186 End stage renal disease: Secondary | ICD-10-CM | POA: Insufficient documentation

## 2021-11-27 DIAGNOSIS — Z87891 Personal history of nicotine dependence: Secondary | ICD-10-CM | POA: Diagnosis not present

## 2021-11-27 DIAGNOSIS — M79671 Pain in right foot: Secondary | ICD-10-CM | POA: Diagnosis not present

## 2021-11-27 DIAGNOSIS — Z79899 Other long term (current) drug therapy: Secondary | ICD-10-CM | POA: Insufficient documentation

## 2021-11-27 DIAGNOSIS — I959 Hypotension, unspecified: Secondary | ICD-10-CM | POA: Diagnosis not present

## 2021-11-27 DIAGNOSIS — M7989 Other specified soft tissue disorders: Secondary | ICD-10-CM | POA: Diagnosis not present

## 2021-11-27 DIAGNOSIS — R609 Edema, unspecified: Secondary | ICD-10-CM | POA: Diagnosis not present

## 2021-11-27 DIAGNOSIS — I5033 Acute on chronic diastolic (congestive) heart failure: Secondary | ICD-10-CM | POA: Insufficient documentation

## 2021-11-27 DIAGNOSIS — M79672 Pain in left foot: Secondary | ICD-10-CM | POA: Diagnosis not present

## 2021-11-27 DIAGNOSIS — M79673 Pain in unspecified foot: Secondary | ICD-10-CM | POA: Diagnosis not present

## 2021-11-27 NOTE — ED Triage Notes (Signed)
Pt BIB [facility] for eval of blt foot pain/swelling. Reports x 1 day. GCS 15, A&Ox4.

## 2021-11-27 NOTE — ED Provider Notes (Signed)
Emergency Medicine Provider Triage Evaluation Note  [name] , a 70 y.o. female  was evaluated in triage.  Pt complains of chronic bilateral foot pain.  This has been ongoing for over a week per her report however today she states "I just had enough of it."  She denies any fevers.  She states she has not missed any dialysis sessions, is due to go tomorrow.  She wants emergency dialysis tonight to try and help with her feet swelling.  She notes she also has gout.  She denies any chest pain or shortness of breath.  Review of Systems  Positive:  Negative: See above  Physical Exam  BP (!) 141/103 (BP Location: Right Leg)    Pulse 100    Temp 98.6 F (37 C) (Oral)    Resp 14    Ht 5\' 1"  (1.549 m)    Wt 57 kg    SpO2 97%    BMI 23.74 kg/m  Gen:   Awake, no distress   Resp:  Normal effort  MSK:   Moves extremities without difficulty  Other:  Bilateral feet are slightly erythematous.  She has capillary refill under 2 seconds to bilateral feet.  Sensation intact to light touch to bilateral feet.  Mild edema bilateral.  Medical Decision Making  Medically screening exam initiated at 8:23 PM.  Appropriate orders placed.  [name] was informed that the remainder of the evaluation will be completed by another provider, this initial triage assessment does not replace that evaluation, and the importance of remaining in the ED until their evaluation is complete.  Will check foot x-rays.  As patient has not missed any dialysis sessions, is not complaining of shortness of breath, and has a history of gout.  She and I discussed that typically foot swelling is not an indication for emergent dialysis, and she has not missed any dialysis sessions making hyperkalemia less likely.  Note: Portions of this report may have been transcribed using voice recognition software. Every effort was made to ensure accuracy; however, inadvertent computerized transcription errors may be present.     Lorin Glass, Hershal Coria 11/27/21 2027    Wyvonnia Dusky, MD 11/27/21 2340

## 2021-11-28 DIAGNOSIS — N186 End stage renal disease: Secondary | ICD-10-CM | POA: Diagnosis not present

## 2021-11-28 DIAGNOSIS — N2581 Secondary hyperparathyroidism of renal origin: Secondary | ICD-10-CM | POA: Diagnosis not present

## 2021-11-28 DIAGNOSIS — Z992 Dependence on renal dialysis: Secondary | ICD-10-CM | POA: Diagnosis not present

## 2021-11-28 MED ORDER — DOXYCYCLINE HYCLATE 100 MG PO TABS
100.0000 mg | ORAL_TABLET | Freq: Once | ORAL | Status: AC
  Administered 2021-11-28: 100 mg via ORAL
  Filled 2021-11-28: qty 1

## 2021-11-28 MED ORDER — DOXYCYCLINE HYCLATE 100 MG PO CAPS: 100.0000 mg | ORAL_CAPSULE | Freq: Two times a day (BID) | ORAL | 0 refills | Status: DC

## 2021-11-28 NOTE — Discharge Instructions (Signed)
Be sure to go to dialysis later today as scheduled.  Get rechecked if you have new or concerning symptoms.

## 2021-11-28 NOTE — ED Provider Notes (Signed)
[facility] EMERGENCY DEPARTMENT Provider Note   CSN: [id number] Arrival date & time: 11/27/21  2007     History Chief Complaint  Patient presents with   Foot Pain    [name] is a 70 y.o. female.  The history is provided by the patient.  Foot Pain [name] is a 70 y.o. female who presents to the Emergency Department complaining of foot pain.  She presents to the emergency department for evaluation of acute on chronic bilateral foot pain.  Pain is worse on the left compared to right.  She does have a history of gout but states that this is different.  Pain is located on the top of her foot.  Pain is worse with weightbearing.  No associated fevers, chest pain, difficulty breathing.  She lives at home with her mother and ambulates with a walker.  She did get a cut to her foot sometime in the last week on the left side.     Past Medical History:  Diagnosis Date   Adenomatous polyp of colon 10/2010, 2006, 2015   Anemia in CKD (chronic kidney disease) 11/07/2012   s/p blood transfusion.    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  Had some early rejection then stable function for years, then had slow decline of function and went back on hemodialysis in 2012.  Gets HD TTS schedule at [facility] on [facility] still using L forearm AVF.      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[place])    left upper arm   Pulmonary  edema 12/2019   Stroke ([place]) 11/2015   TIA   Weight loss, unintentional     Patient Active Problem List   Diagnosis Date Noted   Acute on chronic diastolic (congestive) heart failure ([place]) 11/08/2021   GERD (gastroesophageal reflux disease)    Protein-calorie malnutrition, severe 08/12/2021   Diverticulitis of colon with perforation 08/06/2021   Acute respiratory failure with hypoxia ([place]) 08/06/2021   Major depressive disorder, recurrent episode, moderate ([place]) 02/23/2021   TIA (transient ischemic attack) 02/21/2021   Prolonged QT interval 02/21/2021   NSTEMI (non-ST elevated myocardial infarction) ([place]) 05/14/2020   Volume overload 05/13/2020   Hypotension after procedure 03/16/2020   Chest pain 03/16/2020   Change in mental state 03/15/2020   Ventricular tachycardia 03/15/2020   Seizure ([place]) 03/15/2020   Unresponsive episode 03/15/2020   Pulmonary edema 07/37/1062   Complication of vascular access for dialysis 12/21/2019   Breakdown (mechanical) of surgically created arteriovenous fistula, initial encounter ([place]) 12/18/2019   Weakness 10/11/2019   Aneurysm artery, 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   Dependence on renal dialysis ([place]) 07/24/2019   Iron deficiency anemia, unspecified 06/09/2019   Age-related osteoporosis without current pathological fracture 04/17/2019   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   Coagulation defect,  unspecified ([place]) 04/17/2019   Diarrhea, unspecified 04/17/2019   Encounter for screening for depression 04/17/2019   Headache, unspecified 04/17/2019   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   Pain, unspecified 04/17/2019   Polyp of colon 04/17/2019   Primary generalized (osteo)arthritis 04/17/2019   Pruritus, unspecified 04/17/2019   Pure hypercholesterolemia, unspecified 04/17/2019   Secondary hyperparathyroidism of renal origin ([place]) 04/17/2019   Gastro-esophageal reflux disease without esophagitis 04/17/2019   Essential  (primary) hypertension 04/17/2019   Hyperlipidemia, unspecified 04/17/2019   Transient cerebral ischemic attack, unspecified 04/17/2019   Hypoxemia 12/13/2018   Prolonged Q-T interval on ECG 02/24/2017   Hypokalemia 02/24/2017   Acute ischemic stroke ([place]) 02/23/2017   Malnutrition of moderate degree 12/24/2016   Problem with dialysis access ([place]) 12/21/2016   Acute ischemic colitis ([place]) 05/16/2016   Colitis 05/15/2016   Rectal bleeding 05/15/2016   Neurologic abnormality 11/19/2015   Hyperkalemia 11/19/2015   Anxiety 11/19/2015   Insomnia 11/19/2015   Gait instability    Stroke-like symptom 11/16/2015   Vestibular neuritis 11/16/2015   Stroke (cerebrum) ([place]) 11/16/2015   Dizziness 05/09/2015   Ataxia 05/09/2015   H/O: CVA (cerebrovascular accident) 05/09/2015   Left facial numbness 05/09/2015   Left leg numbness 05/09/2015   Hyperlipidemia    Acute on chronic diastolic CHF (congestive heart failure) ([place])    SOB (shortness of breath) 04/01/2013   HTN (hypertension) 04/01/2013   Cholecystitis, acute 11/07/2012   End-stage renal disease on hemodialysis ([place]) 11/07/2012   Anemia in chronic kidney disease 11/07/2012   Dyspnea 12/31/2011    Past Surgical History:  Procedure Laterality Date   AV FISTULA PLACEMENT     for dialysis   AV FISTULA PLACEMENT Left 11/22/2015   Procedure: ARTERIOVENOUS (AV) FISTULA CREATION-LEFT BRACHIOCEPHALIC;  Surgeon: Serafina Mitchell, MD;  Location: [place];  Service: Vascular;  Laterality: Left;   AV FISTULA PLACEMENT Right 03/15/2020   Procedure: INSERTION OF ARTERIOVENOUS (AV) GORE-TEX GRAFT ARM ( BRACHIAL AXILLARY );  Surgeon: Katha Cabal, MD;  Location: [facility];  Service: Vascular;  Laterality: Right;   BACK SURGERY     CERVICAL FUSION     CHOLECYSTECTOMY  12/02/2012   Procedure: LAPAROSCOPIC CHOLECYSTECTOMY WITH INTRAOPERATIVE CHOLANGIOGRAM;  Surgeon: Edward Jolly, MD;  Location: [place];  Service: General;  Laterality: N/A;    EYE SURGERY Bilateral    cataract surgery   EYE SURGERY Left 2019   laser   HEMATOMA EVACUATION Left 12/24/2016   Procedure: EVACUATION HEMATOMA LEFT UPPER ARM;  Surgeon: Waynetta Sandy, MD;  Location: [place];  Service: Vascular;  Laterality: Left;   I & D EXTREMITY Left 12/31/2016   Procedure: IRRIGATION AND DEBRIDEMENT EXTREMITY;  Surgeon: Angelia Mould, MD;  Location: [place];  Service: Vascular;  Laterality: Left;   INSERTION OF DIALYSIS CATHETER Right 12/24/2016   Procedure: INSERTION OF DIALYSIS CATHETER;  Surgeon: Waynetta Sandy, MD;  Location: [place];  Service: Vascular;  Laterality: Right;   INSERTION OF DIALYSIS CATHETER Right 02/04/2017   Procedure: INSERTION OF DIALYSIS CATHETER;  Surgeon: Waynetta Sandy, MD;  Location: [facility];  Service: Vascular;  Laterality: Right;   KIDNEY TRANSPLANT  1996   PERIPHERAL VASCULAR CATHETERIZATION Left 10/23/2016   Procedure: Fistulagram;  Surgeon: Elam Dutch, MD;  Location: [place] [facility];  Service: Cardiovascular;  Laterality: Left;   PERIPHERAL VASCULAR THROMBECTOMY Right 04/16/2020   Procedure: PERIPHERAL VASCULAR THROMBECTOMY;  Surgeon: Katha Cabal, MD;  Location: [place] [facility];  Service:  Cardiovascular;  Laterality: Right;   RESECTION OF ARTERIOVENOUS FISTULA ANEURYSM Left 11/22/2015   Procedure: RESECTION OF LEFT RADIOCEPHALIC FISTULA ANEURYSM ;  Surgeon: Serafina Mitchell, MD;  Location: [place] OR;  Service: Vascular;  Laterality: Left;   REVISON OF ARTERIOVENOUS FISTULA Left 12/22/2016   Procedure: REVISON OF LEFT ARTERIOVENOUS FISTULA;  Surgeon: Waynetta Sandy, MD;  Location: [place];  Service: Vascular;  Laterality: Left;   REVISON OF ARTERIOVENOUS FISTULA Left 02/04/2017   Procedure: REVISON OF LEFT UPPER ARM ARTERIOVENOUS FISTULA;  Surgeon: Waynetta Sandy, MD;  Location: [place];  Service: Vascular;  Laterality: Left;   UPPER EXTREMITY ANGIOGRAPHY Bilateral 09/19/2019   Procedure:  UPPER EXTREMITY ANGIOGRAPHY;  Surgeon: Katha Cabal, MD;  Location: [place] [facility];  Service: Cardiovascular;  Laterality: Bilateral;     OB History   No obstetric history on file.     Family History  Problem Relation Age of Onset   Colon cancer Brother    Cancer Brother    Coronary artery disease Mother 34   Hyperlipidemia Mother    Hypertension Mother    Stroke Maternal Aunt    Esophageal cancer Neg Hx    Stomach cancer Neg Hx    Rectal cancer Neg Hx     Social History   Tobacco Use   Smoking status: Former    Types: Cigarettes    Quit date: 12/31/1991    Years since quitting: 29.9   Smokeless tobacco: Never  Vaping Use   Vaping Use: Never used  Substance Use Topics   Alcohol use: No    Alcohol/week: 0.0 standard drinks   Drug use: No    Home Medications Prior to Admission medications   Medication Sig Start Date End Date Taking? Authorizing Provider  doxycycline (VIBRAMYCIN) 100 MG capsule Take 1 capsule (100 mg total) by mouth 2 (two) times daily. 11/28/21  Yes Quintella Reichert, MD  acetaminophen (TYLENOL) 500 MG tablet Take 1 tablet (500 mg total) by mouth every 6 (six) hours as needed. Patient taking differently: Take 1,000 mg by mouth every 6 (six) hours as needed for mild pain. 09/05/18   Law, Bea Graff, PA-C  albuterol (VENTOLIN HFA) 108 (90 Base) MCG/ACT inhaler Inhale 1-2 puffs into the lungs every 6 (six) hours as needed for wheezing or shortness of breath. 10/26/21   Jeanell Sparrow, DO  allopurinol (ZYLOPRIM) 300 MG tablet Take 300 mg by mouth daily. 11/03/21   [provider]  ALPRAZolam Duanne Moron) 0.25 MG tablet Take 1 tablet (0.25 mg total) by mouth daily as needed for anxiety. 08/18/21   Nita Sells, MD  atorvastatin (LIPITOR) 80 MG tablet TAKE 1 TABLET (80 MG TOTAL) BY MOUTH DAILY. Patient not taking: Reported on 11/08/2021 02/25/21 02/25/22  Dessa Phi, DO  cinacalcet (SENSIPAR) 30 MG tablet Take 30 mg by mouth 3 (three)  times a week. Mon, Wed, Fri-- Given @ Dialysis 01/04/20   [provider]  folic acid-vitamin b complex-vitamin c-selenium-zinc (DIALYVITE) 3 MG TABS tablet Take 1 tablet by mouth daily.    [provider]  losartan (COZAAR) 50 MG tablet Take 50 mg by mouth daily. 11/03/21   [provider]  ondansetron (ZOFRAN) 8 MG tablet Take 8 mg by mouth 2 (two) times daily as needed for nausea or vomiting.     [provider]  pantoprazole (PROTONIX) 40 MG tablet Take 1 tablet (40 mg total) by mouth daily. 08/18/21   Nita Sells, MD  zolpidem (AMBIEN) 10 MG  tablet Take 10 mg by mouth at bedtime as needed for sleep. 02/13/21   [provider]    Allergies    Sulfa antibiotics and Adhesive [tape]  Review of Systems   Review of Systems  All other systems reviewed and are negative.  Physical Exam Updated Vital Signs BP 102/88    Pulse (!) 130    Temp 98.3 F (36.8 C) (Oral)    Resp 14    Ht 5\' 1"  (1.549 m)    Wt 57 kg    SpO2 94%    BMI 23.74 kg/m   Physical Exam Vitals and nursing note reviewed.  Constitutional:      Appearance: She is well-developed.  HENT:     Head: Normocephalic and atraumatic.  Cardiovascular:     Rate and Rhythm: Normal rate and regular rhythm.     Heart sounds: No murmur heard. Pulmonary:     Effort: Pulmonary effort is normal. No respiratory distress.     Breath sounds: Normal breath sounds.  Abdominal:     Palpations: Abdomen is soft.     Tenderness: There is no abdominal tenderness. There is no guarding or rebound.  Musculoskeletal:     Comments: There are faint DP pulses bilaterally.  There are chronic venous stasis changes to the plantar surfaces of bilateral feet.  The left foot has mild to moderate soft tissue swelling.  There is a wound to the distal lateral aspect of the dorsal left foot with a clean and shallow base.  This is a small wound that is less than half a millimeter in diameter and length.  Surrounding  this wound is moderate erythema and edema.  Skin:    General: Skin is warm and dry.  Neurological:     Mental Status: She is alert and oriented to person, place, and time.  Psychiatric:        Behavior: Behavior normal.    ED Results / Procedures / Treatments   Labs (all labs ordered are listed, but only abnormal results are displayed) Labs Reviewed - No data to display  EKG None  Radiology DG Foot Complete Left  Result Date: 11/27/2021 CLINICAL DATA:  Chronic bilateral foot pain EXAM: LEFT FOOT - COMPLETE 3+ VIEW COMPARISON:  None. FINDINGS: No fracture or dislocation is seen. Mild degenerative changes of the 1st MTP joint. Mild soft tissue swelling along the dorsal forefoot. IMPRESSION: Negative. Electronically Signed   By: Julian Hy M.D.   On: 11/27/2021 21:26   DG Foot Complete Right  Result Date: 11/27/2021 CLINICAL DATA:  Chronic bilateral foot pain EXAM: RIGHT FOOT COMPLETE - 3+ VIEW COMPARISON:  None. FINDINGS: No fracture or dislocation is seen. Mild degenerative changes with hallux valgus deformity at the 1st MTP joint. Hammertoe deformity at the 2nd DIP joint. Mild dorsal soft tissue swelling along the forefoot. Vascular calcifications. IMPRESSION: Negative. Electronically Signed   By: Julian Hy M.D.   On: 11/27/2021 21:29    Procedures Procedures   Medications Ordered in ED Medications  doxycycline (VIBRA-TABS) tablet 100 mg (has no administration in time range)    ED Course  I have reviewed the triage vital signs and the nursing notes.  Pertinent labs & imaging results that were available during my care of the patient were reviewed by me and considered in my medical decision making (see chart for details).    MDM Rules/Calculators/A&P  Patient here for evaluation of bilateral foot pain, acute on chronic with left greater than right pain.  On examination she has soft tissue swelling that is isolated to the left foot,  predominantly the dorsal foot and near the location of a wound.  Examination is concerning for developing cellulitis.  She does not have a history of diabetes.  She has symmetric pulses bilaterally.  No evidence of purulence on examination.  Do not suspect she has a septic arthritis.  Will start on oral antibiotics with plan for continuing dialysis as scheduled.  Please note the patient did have isolated tachycardia on repeat vital sign check.  Unable to assess the patient at that time.  She did state that she used her home inhaler earlier during her ED stay and thought that that was why her heart rate was elevated.  She currently has no systemic symptoms on assessment.    Final Clinical Impression(s) / ED Diagnoses Final diagnoses:  Cellulitis of left lower extremity    Rx / DC Orders ED Discharge Orders          Ordered    doxycycline (VIBRAMYCIN) 100 MG capsule  2 times daily        11/28/21 0052             Quintella Reichert, MD 11/28/21 [phone]

## 2021-11-30 ENCOUNTER — Emergency Department (HOSPITAL_COMMUNITY): Payer: Medicare HMO

## 2021-11-30 ENCOUNTER — Emergency Department (HOSPITAL_COMMUNITY)
Admission: EM | Admit: 2021-11-30 | Discharge: 2021-12-01 | Disposition: A | Discharge status: Home / Self Care | Payer: Medicare HMO | Attending: Emergency Medicine | Admitting: Emergency Medicine

## 2021-11-30 ENCOUNTER — Other Ambulatory Visit: Payer: Self-pay

## 2021-11-30 ENCOUNTER — Encounter (HOSPITAL_COMMUNITY): Payer: Self-pay | Admitting: *Deleted

## 2021-11-30 DIAGNOSIS — Z87891 Personal history of nicotine dependence: Secondary | ICD-10-CM | POA: Diagnosis not present

## 2021-11-30 DIAGNOSIS — I2510 Atherosclerotic heart disease of native coronary artery without angina pectoris: Secondary | ICD-10-CM | POA: Insufficient documentation

## 2021-11-30 DIAGNOSIS — Z20822 Contact with and (suspected) exposure to covid-19: Secondary | ICD-10-CM | POA: Insufficient documentation

## 2021-11-30 DIAGNOSIS — N186 End stage renal disease: Secondary | ICD-10-CM | POA: Diagnosis not present

## 2021-11-30 DIAGNOSIS — I517 Cardiomegaly: Secondary | ICD-10-CM | POA: Diagnosis not present

## 2021-11-30 DIAGNOSIS — Z79899 Other long term (current) drug therapy: Secondary | ICD-10-CM | POA: Diagnosis not present

## 2021-11-30 DIAGNOSIS — R0789 Other chest pain: Secondary | ICD-10-CM | POA: Diagnosis not present

## 2021-11-30 DIAGNOSIS — I5033 Acute on chronic diastolic (congestive) heart failure: Secondary | ICD-10-CM | POA: Diagnosis not present

## 2021-11-30 DIAGNOSIS — R0602 Shortness of breath: Secondary | ICD-10-CM | POA: Diagnosis not present

## 2021-11-30 DIAGNOSIS — I132 Hypertensive heart and chronic kidney disease with heart failure and with stage 5 chronic kidney disease, or end stage renal disease: Secondary | ICD-10-CM | POA: Diagnosis not present

## 2021-11-30 DIAGNOSIS — R059 Cough, unspecified: Secondary | ICD-10-CM | POA: Diagnosis not present

## 2021-11-30 DIAGNOSIS — R140 Abdominal distension (gaseous): Secondary | ICD-10-CM | POA: Diagnosis not present

## 2021-11-30 DIAGNOSIS — J90 Pleural effusion, not elsewhere classified: Secondary | ICD-10-CM | POA: Diagnosis not present

## 2021-11-30 DIAGNOSIS — R0902 Hypoxemia: Secondary | ICD-10-CM | POA: Diagnosis not present

## 2021-11-30 DIAGNOSIS — R069 Unspecified abnormalities of breathing: Secondary | ICD-10-CM | POA: Diagnosis not present

## 2021-11-30 LAB — BASIC METABOLIC PANEL
Anion gap: 13 (ref 5–15)
BUN: 46 mg/dL — ABNORMAL HIGH (ref 8–23)
CO2: 26 mmol/L (ref 22–32)
Calcium: 8.1 mg/dL — ABNORMAL LOW (ref 8.9–10.3)
Chloride: 94 mmol/L — ABNORMAL LOW (ref 98–111)
Creatinine, Ser: 5.72 mg/dL — ABNORMAL HIGH (ref 0.44–1.00)
GFR, Estimated: 7 mL/min — ABNORMAL LOW (ref >60)
Glucose, Bld: 87 mg/dL (ref 70–99)
Potassium: 3.9 mmol/L (ref 3.5–5.1)
Sodium: 133 mmol/L — ABNORMAL LOW (ref 135–145)

## 2021-11-30 LAB — CBC WITH DIFFERENTIAL/PLATELET
Abs Immature Granulocytes: 0.02 10*3/uL (ref 0.00–0.07)
Basophils Absolute: 0 10*3/uL (ref 0.0–0.1)
Basophils Relative: 1 %
Eosinophils Absolute: 0.1 10*3/uL (ref 0.0–0.5)
Eosinophils Relative: 2 %
HCT: 30.3 % — ABNORMAL LOW (ref 36.0–46.0)
Hemoglobin: 9.7 g/dL — ABNORMAL LOW (ref 12.0–15.0)
Immature Granulocytes: 0 %
Lymphocytes Relative: 25 %
Lymphs Abs: 1.3 10*3/uL (ref 0.7–4.0)
MCH: 30.1 pg (ref 26.0–34.0)
MCHC: 32 g/dL (ref 30.0–36.0)
MCV: 94.1 fL (ref 80.0–100.0)
Monocytes Absolute: 0.7 10*3/uL (ref 0.1–1.0)
Monocytes Relative: 13 %
Neutro Abs: 3 10*3/uL (ref 1.7–7.7)
Neutrophils Relative %: 59 %
Platelets: 247 10*3/uL (ref 150–400)
RBC: 3.22 MIL/uL — ABNORMAL LOW (ref 3.87–5.11)
RDW: 16.3 % — ABNORMAL HIGH (ref 11.5–15.5)
WBC: 5.1 10*3/uL (ref 4.0–10.5)
nRBC: 0 % (ref 0.0–0.2)

## 2021-11-30 LAB — RESP PANEL BY RT-PCR (FLU A&B, COVID) ARPGX2
Influenza A by PCR: NEGATIVE
Influenza B by PCR: NEGATIVE
SARS Coronavirus 2 by RT PCR: NEGATIVE

## 2021-11-30 NOTE — ED Triage Notes (Signed)
From home via [facility] with c/o SOB started this morning when she woke up, saturations on RA 95%, scheduled for  dialysis tomorrow. Last was Friday. Abdominal distention. Lung sounds clear. 146/86 (leg), 95% ra, hr 95, rr 18. Alert and oriented.

## 2021-11-30 NOTE — ED Provider Notes (Signed)
Emergency Medicine Provider Triage Evaluation Note  [name] , a 70 y.o. female  was evaluated in triage.  Pt complains of SOB.  States lots of coughing recently, clear mucous.  No fever.  Due for dialysis in the AM, schedule is MWF.  No recent missed sessions.  Review of Systems  Positive: SOB, cough Negative: fever  Physical Exam  BP (!) 146/69    Pulse 95    Temp 98.4 F (36.9 C) (Oral)    Resp 16    SpO2 96%  Gen:   Awake, no distress   Resp:  Normal effort  MSK:   Moves extremities without difficulty  Other:    Medical Decision Making  Medically screening exam initiated at 10:21 PM.  Appropriate orders placed.  [name] was informed that the remainder of the evaluation will be completed by another provider, this initial triage assessment does not replace that evaluation, and the importance of remaining in the ED until their evaluation is complete.  Cough and SOB.  ESRD on HD.  Will check EKG, labs, CXR, covid/flu screen.   Larene Pickett, PA-C 11/30/21 2222    Lajean Saver, MD 11/30/21 2249

## 2021-11-30 NOTE — ED Triage Notes (Signed)
C/o sob when she woke up this morning, has had a cough, pain in the right ribs and weakness.

## 2021-12-01 ENCOUNTER — Encounter (HOSPITAL_COMMUNITY): Payer: Self-pay | Admitting: Emergency Medicine

## 2021-12-01 DIAGNOSIS — N186 End stage renal disease: Secondary | ICD-10-CM | POA: Diagnosis not present

## 2021-12-01 DIAGNOSIS — Z992 Dependence on renal dialysis: Secondary | ICD-10-CM | POA: Diagnosis not present

## 2021-12-01 DIAGNOSIS — N2581 Secondary hyperparathyroidism of renal origin: Secondary | ICD-10-CM | POA: Diagnosis not present

## 2021-12-01 LAB — TROPONIN I (HIGH SENSITIVITY): Troponin I (High Sensitivity): 35 ng/L — ABNORMAL HIGH (ref <18)

## 2021-12-01 MED ORDER — ALBUTEROL SULFATE HFA 108 (90 BASE) MCG/ACT IN AERS
2.0000 | INHALATION_SPRAY | Freq: Once | RESPIRATORY_TRACT | Status: AC
  Administered 2021-12-01: 09:00:00 2 via RESPIRATORY_TRACT
  Filled 2021-12-01: qty 6.7

## 2021-12-01 NOTE — ED Notes (Signed)
Dialysis scheduled for patient today according to her Sister.

## 2021-12-01 NOTE — ED Provider Notes (Signed)
[facility] EMERGENCY DEPARTMENT Provider Note   CSN: [id number] Arrival date & time: 11/30/21  2146     History Chief Complaint  Patient presents with   Shortness of Breath    [name] is a 70 y.o. female.   Shortness of Breath Associated symptoms: cough   Associated symptoms: no abdominal pain, no chest pain, no ear pain, no fever, no neck pain, no rash, no sore throat and no vomiting   70 year old female presenting for shortness of breath.  She states that she woke up with difficulty breathing.  She has history of ESRD.  She has HD M, W, and F.  She is scheduled to get her session today at noon.  Last session was on Friday.  She also has been prescribed albuterol breathing treatments twice daily.  She feels like she needs some albuterol right now.  She has had recent diarrhea that has improved.  She has also recently been seen for left foot pain.  She was prescribed doxycycline 4 days ago.  She states that she never picked up this prescription but her left foot pain has improved.  Patient lives at home with her 25 year old mother.  Patient's son is currently working on skilled nursing facility placement for the patient.  Patient is looking forward to this.    Past Medical History:  Diagnosis Date   Adenomatous polyp of colon 10/2010, 2006, 2015   Anemia in CKD (chronic kidney disease) 11/07/2012   s/p blood transfusion.    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  Had some early rejection then stable function for years, then had slow decline of function and went back on hemodialysis in 2012.  Gets HD TTS schedule at [facility] on [facility] still using L forearm AVF.      GERD  (gastroesophageal reflux disease)    GI bleed 2017   felt to be ischemic colitis, last colo 2015   Headache    Hyperlipidemia    Hypertension    Neurologic gait dysfunction    Neuromuscular disorder (HCC)    neuropathy hand and legs   Osteoporosis    Pneumonia    Pseudoaneurysm of surgical AV fistula ([place])    left upper arm   Pulmonary edema 12/2019   Stroke ([place]) 11/2015   TIA   Weight loss, unintentional     Patient Active Problem List   Diagnosis Date Noted   Acute on chronic diastolic (congestive) heart failure ([place]) 11/08/2021   GERD (gastroesophageal reflux disease)    Protein-calorie malnutrition, severe 08/12/2021   Diverticulitis of colon with perforation 08/06/2021   Acute respiratory failure with hypoxia ([place]) 08/06/2021   Major depressive disorder, recurrent episode, moderate ([place]) 02/23/2021   TIA (transient ischemic attack) 02/21/2021   Prolonged QT interval 02/21/2021   NSTEMI (non-ST elevated myocardial infarction) ([place]) 05/14/2020   Volume overload 05/13/2020   Hypotension after procedure 03/16/2020   Chest pain 03/16/2020   Change in mental state 03/15/2020   Ventricular tachycardia 03/15/2020   Seizure ([place]) 03/15/2020   Unresponsive episode 03/15/2020   Pulmonary edema 78/29/5621   Complication of vascular access for dialysis 12/21/2019   Breakdown (mechanical) of surgically  created arteriovenous fistula, initial encounter ([place]) 12/18/2019   Weakness 10/11/2019   Aneurysm artery, 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   Dependence on renal dialysis ([place]) 07/24/2019   Iron deficiency anemia, unspecified 06/09/2019   Age-related osteoporosis without current pathological fracture 04/17/2019   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   Coagulation defect, unspecified ([place]) 04/17/2019   Diarrhea, unspecified 04/17/2019   Encounter for screening for depression 04/17/2019   Headache, unspecified 04/17/2019   Kidney transplant failure  04/17/2019   Pain, unspecified 04/17/2019   Polyp of colon 04/17/2019   Primary generalized (osteo)arthritis 04/17/2019   Pruritus, unspecified 04/17/2019   Pure hypercholesterolemia, unspecified 04/17/2019   Secondary hyperparathyroidism of renal origin ([place]) 04/17/2019   Gastro-esophageal reflux disease without esophagitis 04/17/2019   Essential (primary) hypertension 04/17/2019   Hyperlipidemia, unspecified 04/17/2019   Transient cerebral ischemic attack, unspecified 04/17/2019   Hypoxemia 12/13/2018   Prolonged Q-T interval on ECG 02/24/2017   Hypokalemia 02/24/2017   Acute ischemic stroke ([place]) 02/23/2017   Malnutrition of moderate degree 12/24/2016   Problem with dialysis access ([place]) 12/21/2016   Acute ischemic colitis ([place]) 05/16/2016   Colitis 05/15/2016   Rectal bleeding 05/15/2016   Neurologic abnormality 11/19/2015   Hyperkalemia 11/19/2015   Anxiety 11/19/2015   Insomnia 11/19/2015   Gait instability    Stroke-like symptom 11/16/2015   Vestibular neuritis 11/16/2015   Stroke (cerebrum) ([place]) 11/16/2015   Dizziness 05/09/2015   Ataxia 05/09/2015   H/O: CVA (cerebrovascular accident) 05/09/2015   Left facial numbness 05/09/2015   Left leg numbness 05/09/2015   Hyperlipidemia    Acute on chronic diastolic CHF (congestive heart failure) ([place])    SOB (shortness of breath) 04/01/2013   HTN (hypertension) 04/01/2013   Cholecystitis, acute 11/07/2012   End-stage renal disease on hemodialysis ([place]) 11/07/2012   Anemia in chronic kidney disease 11/07/2012   Dyspnea 12/31/2011    Past Surgical History:  Procedure Laterality Date   AV FISTULA PLACEMENT     for dialysis   AV FISTULA PLACEMENT Left 11/22/2015   Procedure: ARTERIOVENOUS (AV) FISTULA CREATION-LEFT BRACHIOCEPHALIC;  Surgeon: Serafina Mitchell, MD;  Location: [place];  Service: Vascular;  Laterality: Left;   AV FISTULA PLACEMENT Right 03/15/2020   Procedure: INSERTION OF ARTERIOVENOUS (AV) GORE-TEX GRAFT ARM (  BRACHIAL AXILLARY );  Surgeon: Katha Cabal, MD;  Location: [facility];  Service: Vascular;  Laterality: Right;   BACK SURGERY     CERVICAL FUSION     CHOLECYSTECTOMY  12/02/2012   Procedure: LAPAROSCOPIC CHOLECYSTECTOMY WITH INTRAOPERATIVE CHOLANGIOGRAM;  Surgeon: Edward Jolly, MD;  Location: [place];  Service: General;  Laterality: N/A;   EYE SURGERY Bilateral    cataract surgery   EYE SURGERY Left 2019   laser   HEMATOMA EVACUATION Left 12/24/2016   Procedure: EVACUATION HEMATOMA LEFT UPPER ARM;  Surgeon: Waynetta Sandy, MD;  Location: [place];  Service: Vascular;  Laterality: Left;   I & D EXTREMITY Left 12/31/2016   Procedure: IRRIGATION AND DEBRIDEMENT EXTREMITY;  Surgeon: Angelia Mould, MD;  Location: [place];  Service: Vascular;  Laterality: Left;   INSERTION OF DIALYSIS CATHETER Right 12/24/2016   Procedure: INSERTION OF DIALYSIS CATHETER;  Surgeon: Waynetta Sandy, MD;  Location: [place];  Service: Vascular;  Laterality: Right;   INSERTION OF DIALYSIS CATHETER Right 02/04/2017   Procedure: INSERTION OF DIALYSIS CATHETER;  Surgeon: Waynetta Sandy, MD;  Location: [place];  Service: Vascular;  Laterality: Right;   [place]  PERIPHERAL VASCULAR CATHETERIZATION Left 10/23/2016   Procedure: Fistulagram;  Surgeon: Elam Dutch, MD;  Location: [place] [facility];  Service: Cardiovascular;  Laterality: Left;   PERIPHERAL VASCULAR THROMBECTOMY Right 04/16/2020   Procedure: PERIPHERAL VASCULAR THROMBECTOMY;  Surgeon: Katha Cabal, MD;  Location: [place] [facility];  Service: Cardiovascular;  Laterality: Right;   RESECTION OF ARTERIOVENOUS FISTULA ANEURYSM Left 11/22/2015   Procedure: RESECTION OF LEFT RADIOCEPHALIC FISTULA ANEURYSM ;  Surgeon: Serafina Mitchell, MD;  Location: [place] [facility];  Service: Vascular;  Laterality: Left;   REVISON OF ARTERIOVENOUS FISTULA Left 12/22/2016   Procedure: REVISON OF LEFT ARTERIOVENOUS FISTULA;  Surgeon:  Waynetta Sandy, MD;  Location: [place];  Service: Vascular;  Laterality: Left;   REVISON OF ARTERIOVENOUS FISTULA Left 02/04/2017   Procedure: REVISON OF LEFT UPPER ARM ARTERIOVENOUS FISTULA;  Surgeon: Waynetta Sandy, MD;  Location: [place];  Service: Vascular;  Laterality: Left;   UPPER EXTREMITY ANGIOGRAPHY Bilateral 09/19/2019   Procedure: UPPER EXTREMITY ANGIOGRAPHY;  Surgeon: Katha Cabal, MD;  Location: [place] [facility];  Service: Cardiovascular;  Laterality: Bilateral;     OB History   No obstetric history on file.     Family History  Problem Relation Age of Onset   Colon cancer Brother    Cancer Brother    Coronary artery disease Mother 33   Hyperlipidemia Mother    Hypertension Mother    Stroke Maternal Aunt    Esophageal cancer Neg Hx    Stomach cancer Neg Hx    Rectal cancer Neg Hx     Social History   Tobacco Use   Smoking status: Former    Types: Cigarettes    Quit date: 12/31/1991    Years since quitting: 29.9   Smokeless tobacco: Never  Vaping Use   Vaping Use: Never used  Substance Use Topics   Alcohol use: No    Alcohol/week: 0.0 standard drinks   Drug use: No    Home Medications Prior to Admission medications   Medication Sig Start Date End Date Taking? Authorizing Provider  acetaminophen (TYLENOL) 500 MG tablet Take 1 tablet (500 mg total) by mouth every 6 (six) hours as needed. Patient taking differently: Take 1,000 mg by mouth every 6 (six) hours as needed for mild pain. 09/05/18   Law, Bea Graff, PA-C  albuterol (VENTOLIN HFA) 108 (90 Base) MCG/ACT inhaler Inhale 1-2 puffs into the lungs every 6 (six) hours as needed for wheezing or shortness of breath. 10/26/21   Jeanell Sparrow, DO  allopurinol (ZYLOPRIM) 300 MG tablet Take 300 mg by mouth daily. 11/03/21   [provider]  ALPRAZolam Duanne Moron) 0.25 MG tablet Take 1 tablet (0.25 mg total) by mouth daily as needed for anxiety. 08/18/21   Nita Sells, MD   atorvastatin (LIPITOR) 80 MG tablet TAKE 1 TABLET (80 MG TOTAL) BY MOUTH DAILY. Patient not taking: Reported on 11/08/2021 02/25/21 02/25/22  Dessa Phi, DO  cinacalcet (SENSIPAR) 30 MG tablet Take 30 mg by mouth 3 (three) times a week. Mon, Wed, Fri-- Given @ Dialysis 01/04/20   [provider]  doxycycline (VIBRAMYCIN) 100 MG capsule Take 1 capsule (100 mg total) by mouth 2 (two) times daily. 11/28/21   Quintella Reichert, MD  folic acid-vitamin b complex-vitamin c-selenium-zinc (DIALYVITE) 3 MG TABS tablet Take 1 tablet by mouth daily.    [provider]  losartan (COZAAR) 50 MG tablet Take 50 mg by mouth daily. 11/03/21   [provider]  ondansetron (ZOFRAN) 8 MG tablet Take 8 mg by mouth 2 (two) times daily as needed for nausea or vomiting.     [provider]  pantoprazole (PROTONIX) 40 MG tablet Take 1 tablet (40 mg total) by mouth daily. 08/18/21   Nita Sells, MD  zolpidem (AMBIEN) 10 MG tablet Take 10 mg by mouth at bedtime as needed for sleep. 02/13/21   [provider]    Allergies    Sulfa antibiotics and Adhesive [tape]  Review of Systems   Review of Systems  Constitutional:  Negative for appetite change, chills and fever.  HENT:  Negative for congestion, ear pain and sore throat.   Eyes:  Negative for pain and visual disturbance.  Respiratory:  Positive for cough and shortness of breath. Negative for stridor.   Cardiovascular:  Negative for chest pain, palpitations and leg swelling.       R lower anterolateral rib pain  Gastrointestinal:  Positive for diarrhea (improving). Negative for abdominal pain, nausea and vomiting.  Genitourinary:  Negative for dysuria, flank pain, hematuria and pelvic pain.  Musculoskeletal:  Negative for arthralgias, back pain and neck pain.  Skin:  Negative for color change and rash.  Neurological:  Negative for dizziness, seizures, syncope, weakness, light-headedness and numbness.  All other  systems reviewed and are negative.  Physical Exam Updated Vital Signs BP 91/65    Pulse 89    Temp 98.4 F (36.9 C) (Oral)    Resp 16    SpO2 95%   Physical Exam Vitals and nursing note reviewed.  Constitutional:      General: She is not in acute distress.    Appearance: She is well-developed. She is ill-appearing (Chronically). She is not toxic-appearing or diaphoretic.  HENT:     Head: Normocephalic and atraumatic.     Mouth/Throat:     Mouth: Mucous membranes are moist.     Pharynx: Oropharynx is clear.  Eyes:     Extraocular Movements: Extraocular movements intact.     Conjunctiva/sclera: Conjunctivae normal.  Cardiovascular:     Rate and Rhythm: Normal rate and regular rhythm.     Heart sounds: No murmur heard. Pulmonary:     Effort: Pulmonary effort is normal. No tachypnea, accessory muscle usage or respiratory distress.     Breath sounds: Normal breath sounds. No transmitted upper airway sounds. No decreased breath sounds, wheezing, rhonchi or rales.  Chest:     Chest wall: Tenderness (mild, right lower anterolateral ribs) present.  Abdominal:     Palpations: Abdomen is soft.     Tenderness: There is no abdominal tenderness.  Musculoskeletal:        General: No swelling.     Cervical back: Normal range of motion and neck supple.     Right lower leg: No edema.     Left lower leg: No edema.  Skin:    General: Skin is warm and dry.     Capillary Refill: Capillary refill takes less than 2 seconds.  Neurological:     General: No focal deficit present.     Mental Status: She is alert and oriented to person, place, and time.  Psychiatric:        Mood and Affect: Mood normal.        Behavior: Behavior normal.    ED Results / Procedures / Treatments   Labs (all labs ordered are listed, but only abnormal results are displayed) Labs Reviewed  CBC WITH DIFFERENTIAL/PLATELET - Abnormal; Notable for the following components:  Result Value   RBC 3.22 (*)     Hemoglobin 9.7 (*)    HCT 30.3 (*)    RDW 16.3 (*)    All other components within normal limits  BASIC METABOLIC PANEL - Abnormal; Notable for the following components:   Sodium 133 (*)    Chloride 94 (*)    BUN 46 (*)    Creatinine, Ser 5.72 (*)    Calcium 8.1 (*)    GFR, Estimated 7 (*)    All other components within normal limits  TROPONIN I (HIGH SENSITIVITY) - Abnormal; Notable for the following components:   Troponin I (High Sensitivity) 35 (*)    All other components within normal limits  RESP PANEL BY RT-PCR (FLU A&B, COVID) [id number]    EKG EKG Interpretation  Date/Time:  Monday December 01 2021 09:05:17 EST Ventricular Rate:  90 PR Interval:  186 QRS Duration: 80 QT Interval:  428 QTC Calculation: 523 R Axis:   96 Text Interpretation: Normal sinus rhythm Rightward axis Prolonged QT Abnormal ECG Confirmed by Godfrey Pick [phone]) on 12/01/2021 9:24:33 AM  Radiology DG Chest 2 View  Result Date: 11/30/2021 CLINICAL DATA:  Shortness of breath with cough. EXAM: CHEST - 2 VIEW COMPARISON:  Chest x-ray 11/20/2021. FINDINGS: The heart is enlarged, unchanged. Aorta is tortuous with atherosclerotic calcifications, unchanged. There is no lung consolidation. There is blunting of the left costophrenic angle. There is some stable scarring/calcifications in the upper lungs. There is no pneumothorax. No acute fractures are seen. IMPRESSION: 1. Small left pleural effusion. 2. Stable cardiomegaly. Electronically Signed   By: Ronney Asters M.D.   On: 11/30/2021 22:55    Procedures Procedures   Medications Ordered in ED Medications  albuterol (VENTOLIN HFA) 108 (90 Base) MCG/ACT inhaler 2 puff (2 puffs Inhalation Given 12/01/21 3151)    ED Course  I have reviewed the triage vital signs and the nursing notes.  Pertinent labs & imaging results that were available during my care of the patient were reviewed by me and considered in my medical decision making (see chart for details).     MDM Rules/Calculators/A&P                         70 year old female with extensive medical history, presents for shortness of breath.  This is her fifth ED visit in the past 2 weeks.  She endorses shortness of breath that started this morning when waking up.  She continues to feel short of breath, although states it has improved slightly.  She is scheduled for hemodialysis today at noon.  Patient is well-known to the emergency department.  This is her fifth ED visit in the past 2 weeks.  Patient does appear to be in her normal state of health.  Breathing is not labored.  SPO2 is normal on room air.  She does not have any appreciable wheezing or diminished air movement on lung auscultation.  She does have some slight pursed lip breathing.  She states that she does do albuterol treatments twice daily.  Patient was given albuterol inhaler treatment in the ED.  EKG shows no changes from priors.  Patient's troponin is baseline.  On reassessment, patient continues to rest comfortably.  She is scheduled for dialysis today.  Typically she will be picked up at 11 AM for dialysis session that starts at noon.  Patient to be discharged at 10:30 AM in order to get to her scheduled dialysis session.  She  was advised to return the ED for any worsening symptoms.  She was discharged in stable condition.   Final Clinical Impression(s) / ED Diagnoses Final diagnoses:  SOB (shortness of breath)    Rx / DC Orders ED Discharge Orders     None        Godfrey Pick, MD 12/01/21 1028

## 2021-12-01 NOTE — ED Notes (Signed)
Patient discharge instructions reviewed with the patient. The patient verbalized understanding of instructions. Patient discharged. 

## 2021-12-03 ENCOUNTER — Emergency Department (HOSPITAL_COMMUNITY)
Admission: EM | Admit: 2021-12-03 | Discharge: 2021-12-03 | Disposition: A | Discharge status: Home / Self Care | Payer: Medicare HMO | Attending: Emergency Medicine | Admitting: Emergency Medicine

## 2021-12-03 DIAGNOSIS — R400 Somnolence: Secondary | ICD-10-CM

## 2021-12-03 DIAGNOSIS — Z992 Dependence on renal dialysis: Secondary | ICD-10-CM | POA: Diagnosis not present

## 2021-12-03 DIAGNOSIS — N2581 Secondary hyperparathyroidism of renal origin: Secondary | ICD-10-CM | POA: Diagnosis not present

## 2021-12-03 DIAGNOSIS — G9340 Encephalopathy, unspecified: Secondary | ICD-10-CM

## 2021-12-03 DIAGNOSIS — T50905A Adverse effect of unspecified drugs, medicaments and biological substances, initial encounter: Secondary | ICD-10-CM

## 2021-12-03 DIAGNOSIS — I10 Essential (primary) hypertension: Secondary | ICD-10-CM | POA: Diagnosis not present

## 2021-12-03 DIAGNOSIS — Z87891 Personal history of nicotine dependence: Secondary | ICD-10-CM | POA: Insufficient documentation

## 2021-12-03 DIAGNOSIS — Z79899 Other long term (current) drug therapy: Secondary | ICD-10-CM | POA: Diagnosis not present

## 2021-12-03 DIAGNOSIS — N186 End stage renal disease: Secondary | ICD-10-CM | POA: Insufficient documentation

## 2021-12-03 DIAGNOSIS — I5033 Acute on chronic diastolic (congestive) heart failure: Secondary | ICD-10-CM | POA: Diagnosis not present

## 2021-12-03 DIAGNOSIS — I132 Hypertensive heart and chronic kidney disease with heart failure and with stage 5 chronic kidney disease, or end stage renal disease: Secondary | ICD-10-CM | POA: Insufficient documentation

## 2021-12-03 DIAGNOSIS — R0902 Hypoxemia: Secondary | ICD-10-CM | POA: Diagnosis not present

## 2021-12-03 DIAGNOSIS — I2510 Atherosclerotic heart disease of native coronary artery without angina pectoris: Secondary | ICD-10-CM | POA: Insufficient documentation

## 2021-12-03 DIAGNOSIS — R5383 Other fatigue: Secondary | ICD-10-CM | POA: Diagnosis present

## 2021-12-03 DIAGNOSIS — T426X5A Adverse effect of other antiepileptic and sedative-hypnotic drugs, initial encounter: Secondary | ICD-10-CM | POA: Diagnosis not present

## 2021-12-03 LAB — BASIC METABOLIC PANEL
Anion gap: 17 — ABNORMAL HIGH (ref 5–15)
BUN: 38 mg/dL — ABNORMAL HIGH (ref 8–23)
CO2: 26 mmol/L (ref 22–32)
Calcium: 7.7 mg/dL — ABNORMAL LOW (ref 8.9–10.3)
Chloride: 97 mmol/L — ABNORMAL LOW (ref 98–111)
Creatinine, Ser: 5.54 mg/dL — ABNORMAL HIGH (ref 0.44–1.00)
GFR, Estimated: 8 mL/min — ABNORMAL LOW (ref >60)
Glucose, Bld: 109 mg/dL — ABNORMAL HIGH (ref 70–99)
Potassium: 3.7 mmol/L (ref 3.5–5.1)
Sodium: 140 mmol/L (ref 135–145)

## 2021-12-03 LAB — CBC WITH DIFFERENTIAL/PLATELET
Abs Immature Granulocytes: 0.02 10*3/uL (ref 0.00–0.07)
Basophils Absolute: 0 10*3/uL (ref 0.0–0.1)
Basophils Relative: 1 %
Eosinophils Absolute: 0.1 10*3/uL (ref 0.0–0.5)
Eosinophils Relative: 2 %
HCT: 36.3 % (ref 36.0–46.0)
Hemoglobin: 10.9 g/dL — ABNORMAL LOW (ref 12.0–15.0)
Immature Granulocytes: 0 %
Lymphocytes Relative: 27 %
Lymphs Abs: 1.3 10*3/uL (ref 0.7–4.0)
MCH: 30.2 pg (ref 26.0–34.0)
MCHC: 30 g/dL (ref 30.0–36.0)
MCV: 100.6 fL — ABNORMAL HIGH (ref 80.0–100.0)
Monocytes Absolute: 0.6 10*3/uL (ref 0.1–1.0)
Monocytes Relative: 13 %
Neutro Abs: 2.7 10*3/uL (ref 1.7–7.7)
Neutrophils Relative %: 57 %
Platelets: 227 10*3/uL (ref 150–400)
RBC: 3.61 MIL/uL — ABNORMAL LOW (ref 3.87–5.11)
RDW: 16.7 % — ABNORMAL HIGH (ref 11.5–15.5)
WBC: 4.8 10*3/uL (ref 4.0–10.5)
nRBC: 0 % (ref 0.0–0.2)

## 2021-12-03 IMAGING — CR DG CHEST 2V
2 series · 2 of 2 positions shown · non-contrast
Comparison: 04/06/2021

CLINICAL DATA: Shortness of breath

EXAM:
CHEST - 2 VIEW

[chest pa]
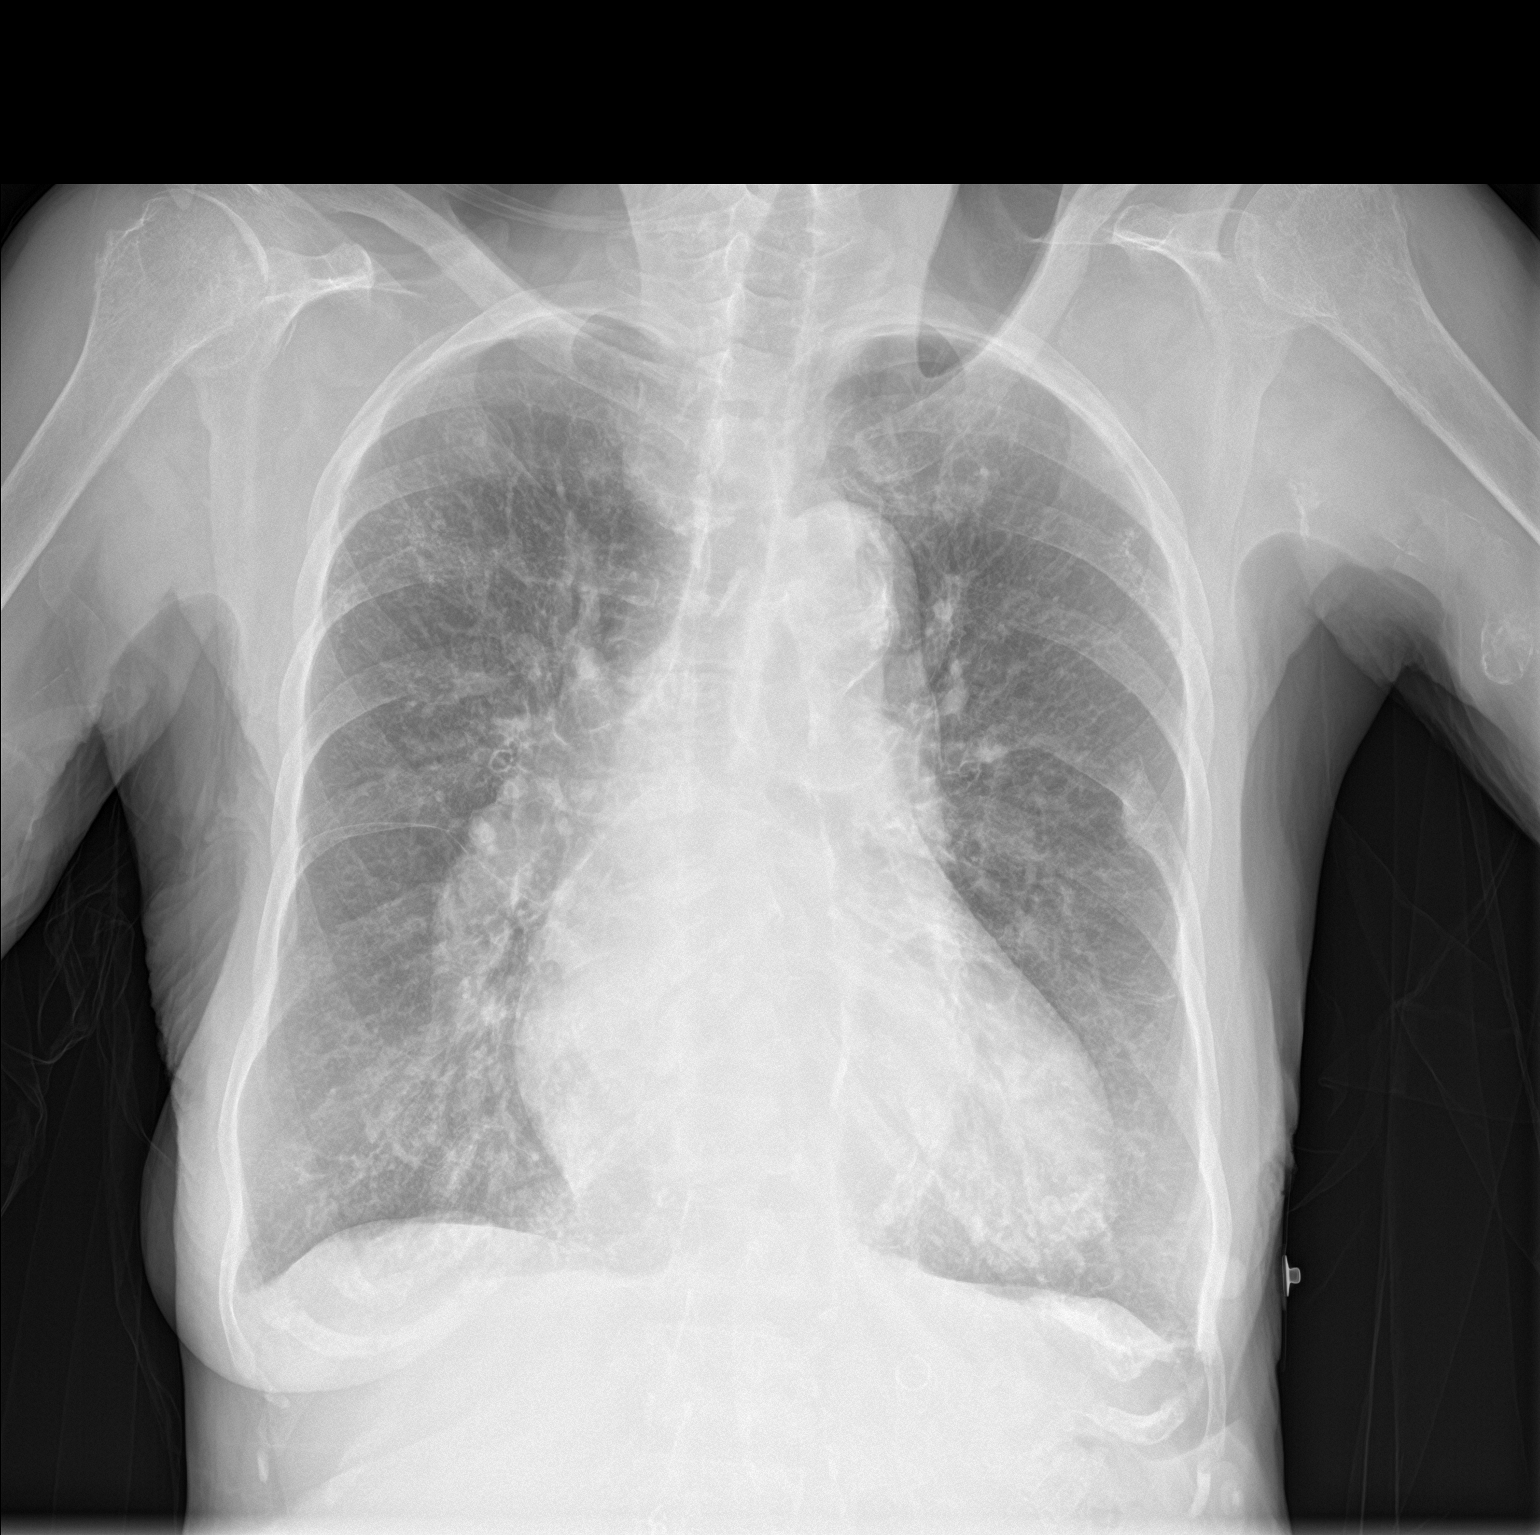

[chest lat]
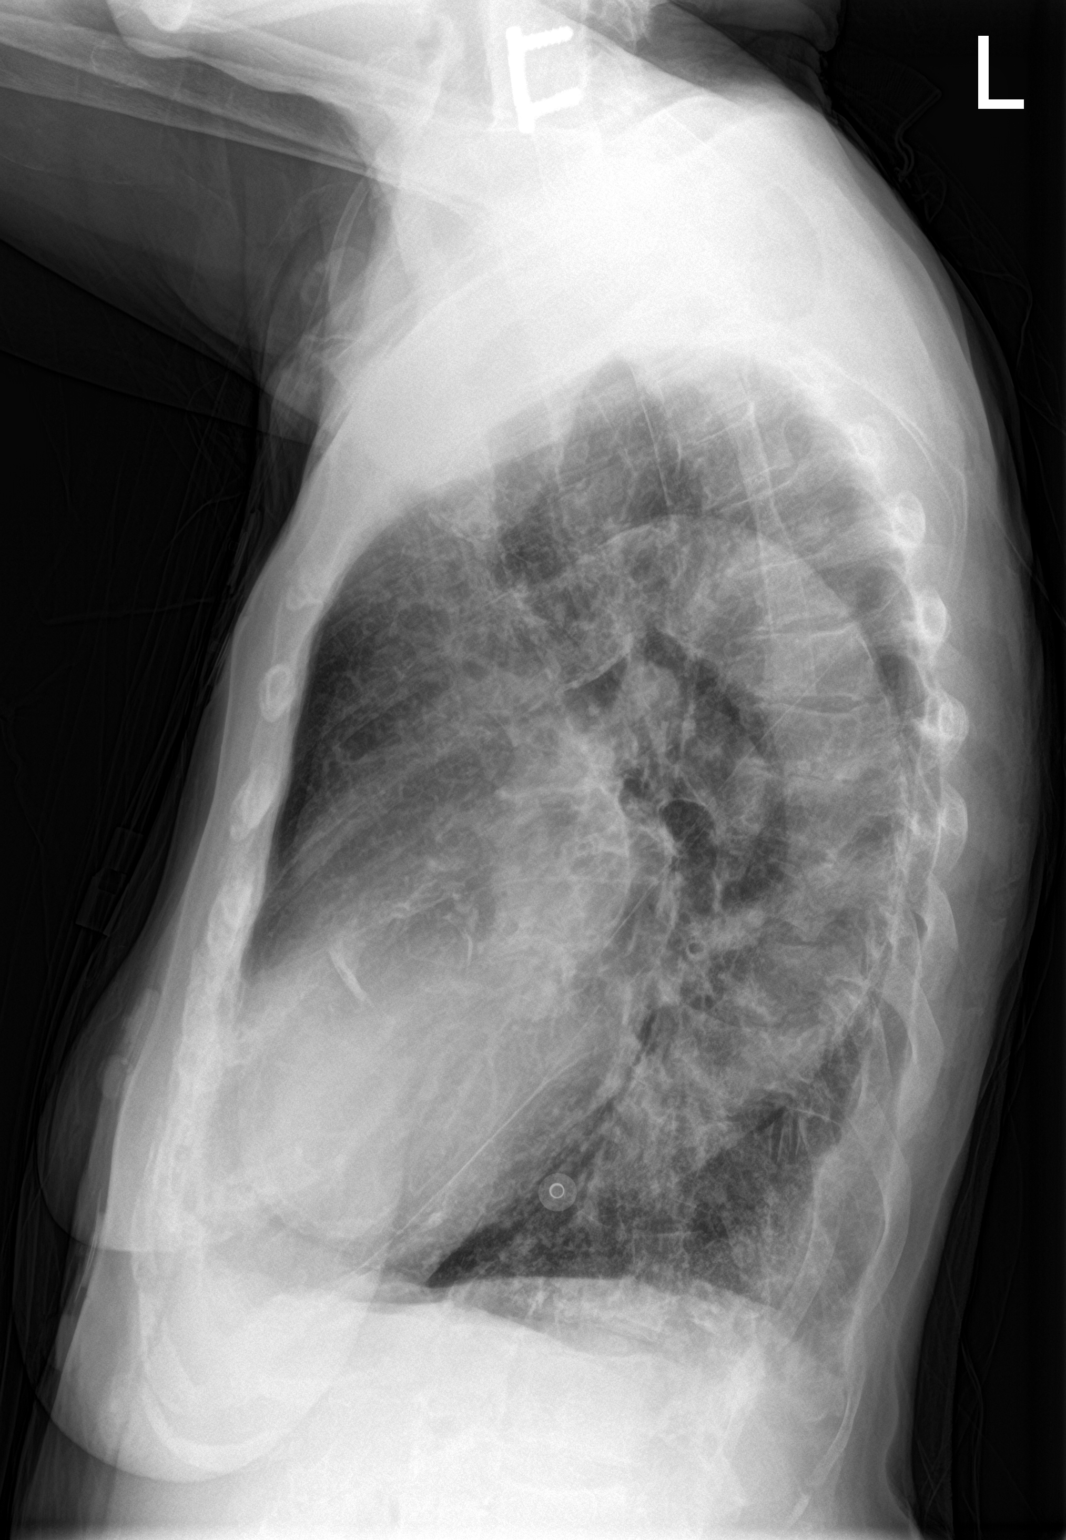

[2 of 2 positions shown; findings below may reference images not displayed]

FINDINGS: Cardiomegaly and hilar vascular fullness. Redemonstrated generalized
interstitial prominence and hyperinflation. Aortic atherosclerosis
and tortuosity.
IMPRESSION: 1. Stable compared to prior.
2. Cardiomegaly and vascular congestion.
3. Hyperinflation.

## 2021-12-03 NOTE — ED Triage Notes (Addendum)
EMS called out by pt daughter for cardiac arrest - when EMS arrived pt was just sleeping - pt did admit to taking two "sleeping pills" last night. Per EMS pt would wake up and answer questions then go back to sleep. Pt is a dialysis pt which is scheduled for today  EMS reports  12 lead unremarkable  Vs 170/93 Pulse 103 12 RR  96% RA  Per EMS pt is restricted on both extremities

## 2021-12-03 NOTE — Discharge Instructions (Addendum)
You are seen in the ER for altered mental status.  Your labs are reassuring.  It appears that the sleepiness was a result of taking the Ambien pills. Because he had dialysis history, the effects of the Ambien could be more pronounced.  We recommend that you get your dialysis today which will likely help reduce the effects of the medication. Someone should be with you all day today, and you should do everything possible to avoid falls.  Please avoid taking too much of the sleep medications.  Return to the ER if you start having any worsening of your symptoms or any new symptoms such as severe headaches, fevers, seizures.

## 2021-12-03 NOTE — ED Provider Notes (Signed)
[facility] EMERGENCY DEPARTMENT Provider Note   CSN: [id number] Arrival date & time: 12/03/21  8841     History Chief Complaint  Patient presents with   Fatigue    [name] is a 70 y.o. female.  HPI     70 year old female with history of CAD, CHF, ESRD on HD comes in with chief complaint of sleepiness and altered mental status.  Per EMS, call went in for cardiac arrest.  When they arrived they noted that patient was in deep sleep.  She was answering questions with noxious stimuli only and then falling asleep.  Patient allegedly reported to them that she took 2 sleeping pills.  I spoke with patient's sister and then her son over the phone.  The son's phone number is [phone].  We try to speak with patient's mother, who actually called 40.  She appears to be having some cognitive delay and was not able to provide meaningful history.  Patient was last seen normal last night.  Allegedly she was up all night, watching television.  Son reports that they have hid the sleep medications, unsure how she got to it.  Patient does respond to noxious stimuli, answer simple questions and follow simple commands.  She does indicate that she took a medication that starts with Z, and that she took the medications to relax her.  She denies any headaches, abdominal pain, chest pain, nausea, vomiting, diarrhea.  Does indicate having a cough.  Past Medical History:  Diagnosis Date   Adenomatous polyp of colon 10/2010, 2006, 2015   Anemia in CKD (chronic kidney disease) 11/07/2012   s/p blood transfusion.    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  Had some early rejection then stable function for years,  then had slow decline of function and went back on hemodialysis in 2012.  Gets HD TTS schedule at [facility] on [facility] still using L forearm AVF.      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[place])    left upper arm   Pulmonary edema 12/2019   Stroke ([place]) 11/2015   TIA   Weight loss, unintentional     Patient Active Problem List   Diagnosis Date Noted   Acute on chronic diastolic (congestive) heart failure ([place]) 11/08/2021   GERD (gastroesophageal reflux disease)    Protein-calorie malnutrition, severe 08/12/2021   Diverticulitis of colon with perforation 08/06/2021   Acute respiratory failure with hypoxia ([place]) 08/06/2021   Major depressive disorder, recurrent episode, moderate ([place]) 02/23/2021   TIA (transient ischemic attack) 02/21/2021   Prolonged QT interval 02/21/2021   NSTEMI (non-ST elevated myocardial infarction) ([place]) 05/14/2020   Volume overload 05/13/2020   Hypotension after procedure 03/16/2020   Chest pain 03/16/2020   Change in mental state 03/15/2020   Ventricular tachycardia 03/15/2020   Seizure ([place]) 03/15/2020  Unresponsive episode 03/15/2020   Pulmonary edema 42/35/3614   Complication of vascular access for dialysis 12/21/2019   Breakdown (mechanical) of surgically created arteriovenous fistula, initial encounter ([place]) 12/18/2019   Weakness 10/11/2019   Aneurysm artery, 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   Dependence on renal dialysis ([place]) 07/24/2019   Iron deficiency anemia, unspecified 06/09/2019   Age-related osteoporosis without current pathological fracture 04/17/2019   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   Coagulation defect, unspecified ([place]) 04/17/2019    Diarrhea, unspecified 04/17/2019   Encounter for screening for depression 04/17/2019   Headache, unspecified 04/17/2019   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   Pain, unspecified 04/17/2019   Polyp of colon 04/17/2019   Primary generalized (osteo)arthritis 04/17/2019   Pruritus, unspecified 04/17/2019   Pure hypercholesterolemia, unspecified 04/17/2019   Secondary hyperparathyroidism of renal origin ([place]) 04/17/2019   Gastro-esophageal reflux disease without esophagitis 04/17/2019   Essential (primary) hypertension 04/17/2019   Hyperlipidemia, unspecified 04/17/2019   Transient cerebral ischemic attack, unspecified 04/17/2019   Hypoxemia 12/13/2018   Prolonged Q-T interval on ECG 02/24/2017   Hypokalemia 02/24/2017   Acute ischemic stroke ([place]) 02/23/2017   Malnutrition of moderate degree 12/24/2016   Problem with dialysis access ([place]) 12/21/2016   Acute ischemic colitis ([place]) 05/16/2016   Colitis 05/15/2016   Rectal bleeding 05/15/2016   Neurologic abnormality 11/19/2015   Hyperkalemia 11/19/2015   Anxiety 11/19/2015   Insomnia 11/19/2015   Gait instability    Stroke-like symptom 11/16/2015   Vestibular neuritis 11/16/2015   Stroke (cerebrum) ([place]) 11/16/2015   Dizziness 05/09/2015   Ataxia 05/09/2015   H/O: CVA (cerebrovascular accident) 05/09/2015   Left facial numbness 05/09/2015   Left leg numbness 05/09/2015   Hyperlipidemia    Acute on chronic diastolic CHF (congestive heart failure) ([place])    SOB (shortness of breath) 04/01/2013   HTN (hypertension) 04/01/2013   Cholecystitis, acute 11/07/2012   End-stage renal disease on hemodialysis ([place]) 11/07/2012   Anemia in chronic kidney disease 11/07/2012   Dyspnea 12/31/2011    Past Surgical History:  Procedure Laterality Date   AV FISTULA PLACEMENT     for dialysis   AV FISTULA PLACEMENT Left 11/22/2015   Procedure: ARTERIOVENOUS (AV) FISTULA CREATION-LEFT BRACHIOCEPHALIC;  Surgeon: Serafina Mitchell, MD;  Location:  [place];  Service: Vascular;  Laterality: Left;   AV FISTULA PLACEMENT Right 03/15/2020   Procedure: INSERTION OF ARTERIOVENOUS (AV) GORE-TEX GRAFT ARM ( BRACHIAL AXILLARY );  Surgeon: Katha Cabal, MD;  Location: [facility];  Service: Vascular;  Laterality: Right;   BACK SURGERY     CERVICAL FUSION     CHOLECYSTECTOMY  12/02/2012   Procedure: LAPAROSCOPIC CHOLECYSTECTOMY WITH INTRAOPERATIVE CHOLANGIOGRAM;  Surgeon: Edward Jolly, MD;  Location: [place] OR;  Service: General;  Laterality: N/A;   EYE SURGERY Bilateral    cataract surgery   EYE SURGERY Left 2019   laser   HEMATOMA EVACUATION Left 12/24/2016   Procedure: EVACUATION HEMATOMA LEFT UPPER ARM;  Surgeon: Waynetta Sandy, MD;  Location: [place];  Service: Vascular;  Laterality: Left;   I & D EXTREMITY Left 12/31/2016   Procedure: IRRIGATION AND DEBRIDEMENT EXTREMITY;  Surgeon: Angelia Mould, MD;  Location: [place];  Service: Vascular;  Laterality: Left;   INSERTION OF DIALYSIS CATHETER Right 12/24/2016   Procedure: INSERTION OF DIALYSIS CATHETER;  Surgeon: Waynetta Sandy, MD;  Location: [facility] OR;  Service: Vascular;  Laterality: Right;   INSERTION OF DIALYSIS CATHETER Right 02/04/2017   Procedure: INSERTION OF DIALYSIS  CATHETER;  Surgeon: Waynetta Sandy, MD;  Location: [place];  Service: Vascular;  Laterality: Right;   [place] Left 10/23/2016   Procedure: Fistulagram;  Surgeon: Elam Dutch, MD;  Location: [place] CV LAB;  Service: Cardiovascular;  Laterality: Left;   PERIPHERAL VASCULAR THROMBECTOMY Right 04/16/2020   Procedure: PERIPHERAL VASCULAR THROMBECTOMY;  Surgeon: Katha Cabal, MD;  Location: [place] CV LAB;  Service: Cardiovascular;  Laterality: Right;   RESECTION OF ARTERIOVENOUS FISTULA ANEURYSM Left 11/22/2015   Procedure: RESECTION OF LEFT RADIOCEPHALIC FISTULA ANEURYSM ;  Surgeon: Serafina Mitchell, MD;  Location: [place] OR;   Service: Vascular;  Laterality: Left;   REVISON OF ARTERIOVENOUS FISTULA Left 12/22/2016   Procedure: REVISON OF LEFT ARTERIOVENOUS FISTULA;  Surgeon: Waynetta Sandy, MD;  Location: [place];  Service: Vascular;  Laterality: Left;   REVISON OF ARTERIOVENOUS FISTULA Left 02/04/2017   Procedure: REVISON OF LEFT UPPER ARM ARTERIOVENOUS FISTULA;  Surgeon: Waynetta Sandy, MD;  Location: [place];  Service: Vascular;  Laterality: Left;   UPPER EXTREMITY ANGIOGRAPHY Bilateral 09/19/2019   Procedure: UPPER EXTREMITY ANGIOGRAPHY;  Surgeon: Katha Cabal, MD;  Location: [place] CV LAB;  Service: Cardiovascular;  Laterality: Bilateral;     OB History   No obstetric history on file.     Family History  Problem Relation Age of Onset   Colon cancer Brother    Cancer Brother    Coronary artery disease Mother 58   Hyperlipidemia Mother    Hypertension Mother    Stroke Maternal Aunt    Esophageal cancer Neg Hx    Stomach cancer Neg Hx    Rectal cancer Neg Hx     Social History   Tobacco Use   Smoking status: Former    Types: Cigarettes    Quit date: 12/31/1991    Years since quitting: 29.9   Smokeless tobacco: Never  Vaping Use   Vaping Use: Never used  Substance Use Topics   Alcohol use: No    Alcohol/week: 0.0 standard drinks   Drug use: No    Home Medications Prior to Admission medications   Medication Sig Start Date End Date Taking? Authorizing Provider  acetaminophen (TYLENOL) 500 MG tablet Take 1 tablet (500 mg total) by mouth every 6 (six) hours as needed. Patient taking differently: Take 1,000 mg by mouth every 6 (six) hours as needed for mild pain. 09/05/18   Law, Bea Graff, PA-C  albuterol (VENTOLIN HFA) 108 (90 Base) MCG/ACT inhaler Inhale 1-2 puffs into the lungs every 6 (six) hours as needed for wheezing or shortness of breath. 10/26/21   Jeanell Sparrow, DO  allopurinol (ZYLOPRIM) 300 MG tablet Take 300 mg by mouth daily. 11/03/21   [provider]  ALPRAZolam Duanne Moron) 0.25 MG tablet Take 1 tablet (0.25 mg total) by mouth daily as needed for anxiety. 08/18/21   Nita Sells, MD  atorvastatin (LIPITOR) 80 MG tablet TAKE 1 TABLET (80 MG TOTAL) BY MOUTH DAILY. Patient not taking: Reported on 11/08/2021 02/25/21 02/25/22  Dessa Phi, DO  cinacalcet (SENSIPAR) 30 MG tablet Take 30 mg by mouth 3 (three) times a week. Mon, Wed, Fri-- Given @ Dialysis 01/04/20   [provider]  doxycycline (VIBRAMYCIN) 100 MG capsule Take 1 capsule (100 mg total) by mouth 2 (two) times daily. 11/28/21   Quintella Reichert, MD  folic acid-vitamin b complex-vitamin c-selenium-zinc (DIALYVITE) 3 MG TABS tablet Take 1 tablet by mouth  daily.    [provider]  losartan (COZAAR) 50 MG tablet Take 50 mg by mouth daily. 11/03/21   [provider]  ondansetron (ZOFRAN) 8 MG tablet Take 8 mg by mouth 2 (two) times daily as needed for nausea or vomiting.     [provider]  pantoprazole (PROTONIX) 40 MG tablet Take 1 tablet (40 mg total) by mouth daily. 08/18/21   Nita Sells, MD  zolpidem (AMBIEN) 10 MG tablet Take 10 mg by mouth at bedtime as needed for sleep. 02/13/21   [provider]    Allergies    Sulfa antibiotics and Adhesive [tape]  Review of Systems   Review of Systems  Unable to perform ROS: Mental status change  Constitutional:  Positive for activity change.  Cardiovascular:  Negative for chest pain.  Gastrointestinal:  Negative for abdominal pain.  Neurological:  Negative for headaches.   Physical Exam Updated Vital Signs BP 125/71 (BP Location: Left Leg)    Pulse 96    Temp 97.8 F (36.6 C) (Oral)    Resp 20    SpO2 93%   Physical Exam Vitals and nursing note reviewed.  Constitutional:      Appearance: She is well-developed.  HENT:     Head: Atraumatic.  Eyes:     Extraocular Movements: Extraocular movements intact.     Pupils: Pupils are equal, round, and reactive to light.      Comments: Pupils are 5 mm and dilated, reactive to light, equal  Cardiovascular:     Rate and Rhythm: Normal rate.  Pulmonary:     Effort: Pulmonary effort is normal.  Musculoskeletal:     Cervical back: Normal range of motion.  Skin:    General: Skin is warm and dry.  Neurological:     Mental Status: She is alert and oriented to person, place, and time.     Comments: Somnolent    ED Results / Procedures / Treatments   Labs (all labs ordered are listed, but only abnormal results are displayed) Labs Reviewed  BASIC METABOLIC PANEL - Abnormal; Notable for the following components:      Result Value   Chloride 97 (*)    Glucose, Bld 109 (*)    BUN 38 (*)    Creatinine, Ser 5.54 (*)    Calcium 7.7 (*)    GFR, Estimated 8 (*)    Anion gap 17 (*)    All other components within normal limits  CBC WITH DIFFERENTIAL/PLATELET - Abnormal; Notable for the following components:   RBC 3.61 (*)    Hemoglobin 10.9 (*)    MCV 100.6 (*)    RDW 16.7 (*)    All other components within normal limits    EKG None  Radiology No results found.  Procedures .Critical Care Performed by: Varney Biles, MD Authorized by: Varney Biles, MD   Critical care provider statement:    Critical care time (minutes):  40   Critical care was necessary to treat or prevent imminent or life-threatening deterioration of the following conditions:  CNS failure or compromise   Critical care was time spent personally by me on the following activities:  Development of treatment plan with patient or surrogate, discussions with consultants, evaluation of patient's response to treatment, examination of patient, ordering and review of laboratory studies, ordering and review of radiographic studies, ordering and performing treatments and interventions, pulse oximetry, re-evaluation of patient's condition and review of old charts   Medications Ordered in ED Medications -  No data to display  ED Course  I have  reviewed the triage vital signs and the nursing notes.  Pertinent labs & imaging results that were available during my care of the patient were reviewed by me and considered in my medical decision making (see chart for details).  Clinical Course as of 12/03/21 1106  Wed Dec 03, 2021  1103 Patient reassessed at 1045.  She sat up for [place].  Informed me that she was watching television last night with Vanetta Shawl.  She continues to admit that she took 2 sleep medications, she is unsure what time or where she found him.  She denies any headache, neck pain, chest pain, shortness of breath, abdominal pain.  Patient is oriented to self, location and time.  She is unsure how she got to the emergency room.  At this time, I feel like she is improved enough that we can discharge her.  We will try to see if we can ambulate her.  She is following simple commands and noted to be moving all 4 extremities.  [AN]    Clinical Course User Index [AN] Varney Biles, MD   MDM Rules/Calculators/A&P                         70 year old female comes in with chief complaint of altered mental status.  She has history of CAD, CHF, ESRD on HD. It appears that patient was last normal last night around 10 or 11 PM.  There is a concern that she could have taken sleep medications for history, which could be contributing to her encephalopathy.  Patient is responding after noxious stimuli only and falls asleep readily.  She however denies any headache, neck pain, falls and there is no signs of trauma above her clavicle.  Patient is moving all 4 extremities.  At this time, we will wait and see if the effects of medications wear off.  I have spoken with the family, it appears that she gets her hemodialysis at noon.  If she starts getting better by 11:00, then she will be discharged to     Final Clinical Impression(s) / ED Diagnoses Final diagnoses:  Adverse effect of drug, initial encounter  Somnolence  Encephalopathy     Rx / DC Orders ED Discharge Orders     None        Varney Biles, MD 12/03/21 1106

## 2021-12-03 NOTE — ED Notes (Signed)
Patient is alert. Patient tried to walk with 2 person assist, she stated she walks with a walker at home normally.

## 2021-12-05 DIAGNOSIS — N186 End stage renal disease: Secondary | ICD-10-CM | POA: Diagnosis not present

## 2021-12-05 DIAGNOSIS — Z992 Dependence on renal dialysis: Secondary | ICD-10-CM | POA: Diagnosis not present

## 2021-12-05 DIAGNOSIS — N2581 Secondary hyperparathyroidism of renal origin: Secondary | ICD-10-CM | POA: Diagnosis not present

## 2021-12-08 DIAGNOSIS — N2581 Secondary hyperparathyroidism of renal origin: Secondary | ICD-10-CM | POA: Diagnosis not present

## 2021-12-08 DIAGNOSIS — N186 End stage renal disease: Secondary | ICD-10-CM | POA: Diagnosis not present

## 2021-12-08 DIAGNOSIS — Z992 Dependence on renal dialysis: Secondary | ICD-10-CM | POA: Diagnosis not present

## 2021-12-09 DIAGNOSIS — N186 End stage renal disease: Secondary | ICD-10-CM | POA: Diagnosis not present

## 2021-12-09 DIAGNOSIS — E43 Unspecified severe protein-calorie malnutrition: Secondary | ICD-10-CM | POA: Diagnosis not present

## 2021-12-09 DIAGNOSIS — I722 Aneurysm of renal artery: Secondary | ICD-10-CM | POA: Diagnosis not present

## 2021-12-09 DIAGNOSIS — F418 Other specified anxiety disorders: Secondary | ICD-10-CM | POA: Diagnosis not present

## 2021-12-09 DIAGNOSIS — I132 Hypertensive heart and chronic kidney disease with heart failure and with stage 5 chronic kidney disease, or end stage renal disease: Secondary | ICD-10-CM | POA: Diagnosis not present

## 2021-12-09 DIAGNOSIS — D631 Anemia in chronic kidney disease: Secondary | ICD-10-CM | POA: Diagnosis not present

## 2021-12-09 DIAGNOSIS — E7800 Pure hypercholesterolemia, unspecified: Secondary | ICD-10-CM | POA: Diagnosis not present

## 2021-12-09 DIAGNOSIS — K219 Gastro-esophageal reflux disease without esophagitis: Secondary | ICD-10-CM | POA: Diagnosis not present

## 2021-12-09 DIAGNOSIS — I5033 Acute on chronic diastolic (congestive) heart failure: Secondary | ICD-10-CM | POA: Diagnosis not present

## 2021-12-10 DIAGNOSIS — Z992 Dependence on renal dialysis: Secondary | ICD-10-CM | POA: Diagnosis not present

## 2021-12-10 DIAGNOSIS — N186 End stage renal disease: Secondary | ICD-10-CM | POA: Diagnosis not present

## 2021-12-10 DIAGNOSIS — N2581 Secondary hyperparathyroidism of renal origin: Secondary | ICD-10-CM | POA: Diagnosis not present

## 2021-12-11 DIAGNOSIS — E7800 Pure hypercholesterolemia, unspecified: Secondary | ICD-10-CM | POA: Diagnosis not present

## 2021-12-11 DIAGNOSIS — I5033 Acute on chronic diastolic (congestive) heart failure: Secondary | ICD-10-CM | POA: Diagnosis not present

## 2021-12-11 DIAGNOSIS — K219 Gastro-esophageal reflux disease without esophagitis: Secondary | ICD-10-CM | POA: Diagnosis not present

## 2021-12-11 DIAGNOSIS — N186 End stage renal disease: Secondary | ICD-10-CM | POA: Diagnosis not present

## 2021-12-11 DIAGNOSIS — F418 Other specified anxiety disorders: Secondary | ICD-10-CM | POA: Diagnosis not present

## 2021-12-11 DIAGNOSIS — D631 Anemia in chronic kidney disease: Secondary | ICD-10-CM | POA: Diagnosis not present

## 2021-12-11 DIAGNOSIS — I722 Aneurysm of renal artery: Secondary | ICD-10-CM | POA: Diagnosis not present

## 2021-12-11 DIAGNOSIS — E43 Unspecified severe protein-calorie malnutrition: Secondary | ICD-10-CM | POA: Diagnosis not present

## 2021-12-11 DIAGNOSIS — I132 Hypertensive heart and chronic kidney disease with heart failure and with stage 5 chronic kidney disease, or end stage renal disease: Secondary | ICD-10-CM | POA: Diagnosis not present

## 2021-12-12 DIAGNOSIS — Z992 Dependence on renal dialysis: Secondary | ICD-10-CM | POA: Diagnosis not present

## 2021-12-12 DIAGNOSIS — N2581 Secondary hyperparathyroidism of renal origin: Secondary | ICD-10-CM | POA: Diagnosis not present

## 2021-12-12 DIAGNOSIS — N186 End stage renal disease: Secondary | ICD-10-CM | POA: Diagnosis not present

## 2021-12-13 DIAGNOSIS — N186 End stage renal disease: Secondary | ICD-10-CM | POA: Diagnosis not present

## 2021-12-13 DIAGNOSIS — T8612 Kidney transplant failure: Secondary | ICD-10-CM | POA: Diagnosis not present

## 2021-12-13 DIAGNOSIS — Z992 Dependence on renal dialysis: Secondary | ICD-10-CM | POA: Diagnosis not present

## 2021-12-15 ENCOUNTER — Emergency Department (HOSPITAL_COMMUNITY): Payer: Medicare Other

## 2021-12-15 ENCOUNTER — Emergency Department (HOSPITAL_COMMUNITY)
Admission: EM | Admit: 2021-12-15 | Discharge: 2021-12-15 | Disposition: A | Discharge status: Home / Self Care | Payer: Medicare Other | Attending: Emergency Medicine | Admitting: Emergency Medicine

## 2021-12-15 ENCOUNTER — Encounter (HOSPITAL_COMMUNITY): Payer: Self-pay | Admitting: *Deleted

## 2021-12-15 ENCOUNTER — Other Ambulatory Visit: Payer: Self-pay

## 2021-12-15 DIAGNOSIS — R059 Cough, unspecified: Secondary | ICD-10-CM | POA: Diagnosis not present

## 2021-12-15 DIAGNOSIS — N186 End stage renal disease: Secondary | ICD-10-CM | POA: Insufficient documentation

## 2021-12-15 DIAGNOSIS — R5383 Other fatigue: Secondary | ICD-10-CM | POA: Diagnosis not present

## 2021-12-15 DIAGNOSIS — Z79899 Other long term (current) drug therapy: Secondary | ICD-10-CM | POA: Diagnosis not present

## 2021-12-15 DIAGNOSIS — R069 Unspecified abnormalities of breathing: Secondary | ICD-10-CM | POA: Diagnosis not present

## 2021-12-15 DIAGNOSIS — I132 Hypertensive heart and chronic kidney disease with heart failure and with stage 5 chronic kidney disease, or end stage renal disease: Secondary | ICD-10-CM | POA: Insufficient documentation

## 2021-12-15 DIAGNOSIS — R0602 Shortness of breath: Secondary | ICD-10-CM | POA: Diagnosis not present

## 2021-12-15 DIAGNOSIS — D631 Anemia in chronic kidney disease: Secondary | ICD-10-CM | POA: Diagnosis not present

## 2021-12-15 DIAGNOSIS — I2510 Atherosclerotic heart disease of native coronary artery without angina pectoris: Secondary | ICD-10-CM | POA: Insufficient documentation

## 2021-12-15 DIAGNOSIS — Z992 Dependence on renal dialysis: Secondary | ICD-10-CM | POA: Diagnosis not present

## 2021-12-15 DIAGNOSIS — I509 Heart failure, unspecified: Secondary | ICD-10-CM | POA: Diagnosis not present

## 2021-12-15 DIAGNOSIS — L299 Pruritus, unspecified: Secondary | ICD-10-CM | POA: Diagnosis not present

## 2021-12-15 DIAGNOSIS — R52 Pain, unspecified: Secondary | ICD-10-CM | POA: Diagnosis not present

## 2021-12-15 DIAGNOSIS — R197 Diarrhea, unspecified: Secondary | ICD-10-CM | POA: Diagnosis not present

## 2021-12-15 DIAGNOSIS — N2581 Secondary hyperparathyroidism of renal origin: Secondary | ICD-10-CM | POA: Diagnosis not present

## 2021-12-15 LAB — BASIC METABOLIC PANEL
Anion gap: 16 — ABNORMAL HIGH (ref 5–15)
BUN: 68 mg/dL — ABNORMAL HIGH (ref 8–23)
CO2: 26 mmol/L (ref 22–32)
Calcium: 8.5 mg/dL — ABNORMAL LOW (ref 8.9–10.3)
Chloride: 94 mmol/L — ABNORMAL LOW (ref 98–111)
Creatinine, Ser: 6.22 mg/dL — ABNORMAL HIGH (ref 0.44–1.00)
GFR, Estimated: 7 mL/min — ABNORMAL LOW (ref >60)
Glucose, Bld: 84 mg/dL (ref 70–99)
Potassium: 4.3 mmol/L (ref 3.5–5.1)
Sodium: 136 mmol/L (ref 135–145)

## 2021-12-15 LAB — CBC WITH DIFFERENTIAL/PLATELET
Abs Immature Granulocytes: 0.01 10*3/uL (ref 0.00–0.07)
Basophils Absolute: 0 10*3/uL (ref 0.0–0.1)
Basophils Relative: 1 %
Eosinophils Absolute: 0.2 10*3/uL (ref 0.0–0.5)
Eosinophils Relative: 4 %
HCT: 34.8 % — ABNORMAL LOW (ref 36.0–46.0)
Hemoglobin: 11.2 g/dL — ABNORMAL LOW (ref 12.0–15.0)
Immature Granulocytes: 0 %
Lymphocytes Relative: 29 %
Lymphs Abs: 1.3 10*3/uL (ref 0.7–4.0)
MCH: 30.5 pg (ref 26.0–34.0)
MCHC: 32.2 g/dL (ref 30.0–36.0)
MCV: 94.8 fL (ref 80.0–100.0)
Monocytes Absolute: 0.5 10*3/uL (ref 0.1–1.0)
Monocytes Relative: 11 %
Neutro Abs: 2.6 10*3/uL (ref 1.7–7.7)
Neutrophils Relative %: 55 %
Platelets: 181 10*3/uL (ref 150–400)
RBC: 3.67 MIL/uL — ABNORMAL LOW (ref 3.87–5.11)
RDW: 17.6 % — ABNORMAL HIGH (ref 11.5–15.5)
WBC: 4.6 10*3/uL (ref 4.0–10.5)
nRBC: 0 % (ref 0.0–0.2)

## 2021-12-15 NOTE — ED Provider Notes (Signed)
[facility] EMERGENCY DEPARTMENT Provider Note   CSN: [id number] Arrival date & time: 12/15/21  0734     History  Chief Complaint  Patient presents with   Shortness of Breath    [name] is a 71 y.o. female.  Patient has a past medical history of end-stage renal disease on HD MWF, hypertension, hyperlipidemia, CHF, history of CVA, coronary artery disease  Patient presents the emergency department today for complaints of shortness of breath.  She states that she has been feeling short of breath for a while now.  She denies any change in the sensations.  She states that she was feeling more short of breath prior to being picked up by the EMS, however she feels back to her baseline at this point.  She states that she also says that she has had some bilateral foot swelling.  She denies any tenderness, numbness, tingling, decreased strength to her feet.  She is also a chronic dialysis patient and is an uric at baseline.  She has on a Monday Wednesday Friday schedule and is scheduled to have dialysis today.  She normally gets picked up at around 11 AM for dialysis.  The history is provided by the patient.  Shortness of Breath Associated symptoms: no abdominal pain, no chest pain, no cough, no ear pain, no fever, no rash, no sore throat and no vomiting       Home Medications Prior to Admission medications   Medication Sig Start Date End Date Taking? Authorizing Provider  acetaminophen (TYLENOL) 500 MG tablet Take 1 tablet (500 mg total) by mouth every 6 (six) hours as needed. Patient taking differently: Take 1,000 mg by mouth every 6 (six) hours as needed for mild pain. 09/05/18   Law, Bea Graff, PA-C  albuterol (VENTOLIN HFA) 108 (90 Base) MCG/ACT inhaler Inhale 1-2 puffs into the lungs every 6 (six) hours as needed for wheezing or shortness of breath. 10/26/21   Jeanell Sparrow, DO  allopurinol (ZYLOPRIM) 300 MG tablet Take 300 mg by mouth daily. 11/03/21    [provider]  ALPRAZolam Duanne Moron) 0.25 MG tablet Take 1 tablet (0.25 mg total) by mouth daily as needed for anxiety. 08/18/21   Nita Sells, MD  atorvastatin (LIPITOR) 80 MG tablet TAKE 1 TABLET (80 MG TOTAL) BY MOUTH DAILY. Patient not taking: Reported on 11/08/2021 02/25/21 02/25/22  Dessa Phi, DO  cinacalcet (SENSIPAR) 30 MG tablet Take 30 mg by mouth 3 (three) times a week. Mon, Wed, Fri-- Given @ Dialysis 01/04/20   [provider]  doxycycline (VIBRAMYCIN) 100 MG capsule Take 1 capsule (100 mg total) by mouth 2 (two) times daily. 11/28/21   Quintella Reichert, MD  folic acid-vitamin b complex-vitamin c-selenium-zinc (DIALYVITE) 3 MG TABS tablet Take 1 tablet by mouth daily.    [provider]  losartan (COZAAR) 50 MG tablet Take 50 mg by mouth daily. 11/03/21   [provider]  ondansetron (ZOFRAN) 8 MG tablet Take 8 mg by mouth 2 (two) times daily as needed for nausea or vomiting.     [provider]  pantoprazole (PROTONIX) 40 MG tablet Take 1 tablet (40 mg total) by mouth daily. 08/18/21   Nita Sells, MD  zolpidem (AMBIEN) 10 MG tablet Take 10 mg by mouth at bedtime as needed for sleep. 02/13/21   [provider]      Allergies    Sulfa antibiotics and Adhesive [tape]    Review of Systems   Review of  Systems  Constitutional:  Negative for chills and fever.  HENT:  Negative for ear pain and sore throat.   Eyes:  Negative for pain and visual disturbance.  Respiratory:  Positive for shortness of breath. Negative for cough.   Cardiovascular:  Positive for leg swelling. Negative for chest pain and palpitations.  Gastrointestinal:  Negative for abdominal pain and vomiting.  Genitourinary:  Negative for dysuria and hematuria.  Musculoskeletal:  Negative for arthralgias and back pain.  Skin:  Negative for color change and rash.  Neurological:  Negative for seizures and syncope.  All other systems reviewed and are  negative.  Physical Exam Updated Vital Signs BP (!) 162/90 (BP Location: Right Leg)    Pulse 81    Temp (!) 97.5 F (36.4 C) (Oral)    Resp 16    Ht 5\' 2"  (1.575 m)    Wt 61.2 kg    SpO2 95%    BMI 24.69 kg/m  Physical Exam Vitals and nursing note reviewed.  Constitutional:      General: She is not in acute distress.    Appearance: Normal appearance. She is not ill-appearing, toxic-appearing or diaphoretic.  HENT:     Head: Normocephalic and atraumatic.     Nose: No nasal deformity.     Mouth/Throat:     Lips: Pink. No lesions.     Mouth: No injury, lacerations, oral lesions or angioedema.     Pharynx: Uvula midline. No uvula swelling.  Eyes:     General: Gaze aligned appropriately. No scleral icterus.       Right eye: No discharge.        Left eye: No discharge.     Conjunctiva/sclera: Conjunctivae normal.     Right eye: Right conjunctiva is not injected. No exudate or hemorrhage.    Left eye: Left conjunctiva is not injected. No exudate or hemorrhage. Cardiovascular:     Rate and Rhythm: Normal rate and regular rhythm.     Pulses: Normal pulses.          Radial pulses are 2+ on the right side and 2+ on the left side.       Dorsalis pedis pulses are 2+ on the right side and 2+ on the left side.     Heart sounds: Normal heart sounds, S1 normal and S2 normal. Heart sounds not distant. No murmur heard.   No friction rub. No gallop. No S3 or S4 sounds.     Comments: Patient has AV graft in right upper extremity with positive thrill and bruit.  She has a nonworking AV fistula in the left upper extremity. Pulmonary:     Effort: Pulmonary effort is normal. No accessory muscle usage or respiratory distress.     Breath sounds: Normal breath sounds. No stridor. No wheezing, rhonchi or rales.     Comments: Lung sounds are clear to auscultation bilaterally.  She has normal effort noted.  No respiratory distress. Chest:     Chest wall: No tenderness.  Abdominal:     General: Abdomen is  flat. Bowel sounds are normal. There is no distension.     Palpations: Abdomen is soft. There is no mass or pulsatile mass.     Tenderness: There is no abdominal tenderness. There is no guarding or rebound.  Musculoskeletal:     Right lower leg: No edema.     Left lower leg: No edema.     Comments: No notable lower extremity edema.  Feet without erythema, swelling, or tenderness.  Skin:    General: Skin is warm and dry.     Coloration: Skin is not jaundiced or pale.     Findings: No bruising, erythema, lesion or rash.  Neurological:     General: No focal deficit present.     Mental Status: She is alert and oriented to person, place, and time.     GCS: GCS eye subscore is 4. GCS verbal subscore is 5. GCS motor subscore is 6.  Psychiatric:        Mood and Affect: Mood normal.        Behavior: Behavior normal. Behavior is cooperative.    ED Results / Procedures / Treatments   Labs (all labs ordered are listed, but only abnormal results are displayed) Labs Reviewed  BASIC METABOLIC PANEL - Abnormal; Notable for the following components:      Result Value   Chloride 94 (*)    BUN 68 (*)    Creatinine, Ser 6.22 (*)    Calcium 8.5 (*)    GFR, Estimated 7 (*)    Anion gap 16 (*)    All other components within normal limits  CBC WITH DIFFERENTIAL/PLATELET - Abnormal; Notable for the following components:   RBC 3.67 (*)    Hemoglobin 11.2 (*)    HCT 34.8 (*)    RDW 17.6 (*)    All other components within normal limits    EKG None  Radiology DG Chest 2 View  Result Date: 12/15/2021 CLINICAL DATA:  Shortness of breath EXAM: CHEST - 2 VIEW COMPARISON:  November 30, 2021 FINDINGS: The heart size is enlarged. The mediastinal contour is stable. The aorta is tortuous. There is mild diffuse increased pulmonary interstitium bilaterally. There is no focal pneumonia or pleural effusion. The visualized skeletal structures are unremarkable. IMPRESSION: Mild congestive heart failure.  Electronically Signed   By: Abelardo Diesel M.D.   On: 12/15/2021 08:46    Procedures Procedures    Medications Ordered in ED Medications - No data to display  ED Course/ Medical Decision Making/ A&P Clinical Course as of 12/15/21 1122  Mon Dec 15, 2021  7169 Chronic Anemia stable.  [GL]  0856 There is some mild increased interstitial edema compared to previous CXR [GL]  0857 EKG with NSR [GL]    Clinical Course User Index [GL] Sherre Poot Adora Fridge, PA-C                           Medical Decision Making Is a 71 year old female who presents to the emergency department with complaints of shortness of breath and bilateral feet swelling.  This is her seventh emergency department visit over the past month.  Each time, she is having similar complaints, and they seem to be resolved by the time she gets to the emergency department.  On arrival, she is afebrile, patient is hemodynamically stable, oxygenating at 98% on room air.  On exam, she does not appear hyper hypovolemic.  She does not have any respiratory distress with clear lung sounds.  Legs and feet without any notable edema, erythema, or tenderness.  It seems like shortness of breath has been an ongoing problem over the past several months and does not seem to have acutely worsened.    I ordered basic labs, EKG, and chest x-ray to evaluate need for emergent dialysis at this time.  I have low suspicion for an acute underlying pulmonary process given the chronicity of patient's symptoms.  Doubt heart failure  exasperation at this time given no signs of hypervolemia and noted respiratory distress.  Labs with stable baseline creat. CXR does reveal some pulmonary congestion that is worse than previous. Other labs are normal and unrevealing. EKG with no acute changes. I feel that patient's symptoms are likely due to mild pulmonary edema with element of anxiety. Patient will benefit from dialysis today, but there is no indication for emergent dialysis in  the ED. She is scheduled for today at 12 pm and should be able to make this. I feel patient is stable for discharge and I have discussed the plan in detail with the patient. She verbalizes understanding.  Problems Addressed: Shortness of breath: chronic illness or injury with exacerbation, progression, or side effects of treatment    Details: Chronic shortness of breath likely 2/2 ESRD, exasperated by need for HD today; resolved upon arrival  Amount and/or Complexity of Data Reviewed External Data Reviewed: labs, radiology, ECG and notes.    Details: Admitted on 11/25 for CHF exasperation. Has been seen in the ED seven times since admission. Baseline Creat is between 5-7. Labs: ordered. Decision-making details documented in ED Course. Radiology: ordered and independent interpretation performed. Decision-making details documented in ED Course. ECG/medicine tests: ordered and independent interpretation performed. Decision-making details documented in ED Course.    Details: NSR. QT prolongation noted.  Risk Prescription drug management. Parenteral controlled substances.     Final Clinical Impression(s) / ED Diagnoses Final diagnoses:  Shortness of breath    Rx / DC Orders ED Discharge Orders     None         Adolphus Birchwood, PA-C 12/15/21 1122    Davonna Belling, MD 12/15/21 [phone]

## 2021-12-15 NOTE — ED Triage Notes (Signed)
Patient presents to ed via [place] states she is due for dialysis today , patient is right arm restricted. Patient states she wants oxygen at home however her sats are great and she is in no distress. Able to speak and have a complete conversation.

## 2021-12-15 NOTE — ED Notes (Signed)
Patient states she is calling her sister to come and pick her up.

## 2021-12-15 NOTE — Discharge Instructions (Signed)
You were seen in the ED for shortness of breath. Your labs and imaging were reassuring. I think that your symptoms will start improving after your dialysis session today. Please attend this as scheduled. Return to the ED if you develop worsening symptoms.

## 2021-12-16 DIAGNOSIS — F418 Other specified anxiety disorders: Secondary | ICD-10-CM | POA: Diagnosis not present

## 2021-12-16 DIAGNOSIS — E7800 Pure hypercholesterolemia, unspecified: Secondary | ICD-10-CM | POA: Diagnosis not present

## 2021-12-16 DIAGNOSIS — N186 End stage renal disease: Secondary | ICD-10-CM | POA: Diagnosis not present

## 2021-12-16 DIAGNOSIS — I5033 Acute on chronic diastolic (congestive) heart failure: Secondary | ICD-10-CM | POA: Diagnosis not present

## 2021-12-16 DIAGNOSIS — D631 Anemia in chronic kidney disease: Secondary | ICD-10-CM | POA: Diagnosis not present

## 2021-12-16 DIAGNOSIS — K219 Gastro-esophageal reflux disease without esophagitis: Secondary | ICD-10-CM | POA: Diagnosis not present

## 2021-12-16 DIAGNOSIS — I132 Hypertensive heart and chronic kidney disease with heart failure and with stage 5 chronic kidney disease, or end stage renal disease: Secondary | ICD-10-CM | POA: Diagnosis not present

## 2021-12-16 DIAGNOSIS — E43 Unspecified severe protein-calorie malnutrition: Secondary | ICD-10-CM | POA: Diagnosis not present

## 2021-12-16 DIAGNOSIS — I722 Aneurysm of renal artery: Secondary | ICD-10-CM | POA: Diagnosis not present

## 2021-12-17 DIAGNOSIS — R52 Pain, unspecified: Secondary | ICD-10-CM | POA: Diagnosis not present

## 2021-12-17 DIAGNOSIS — L299 Pruritus, unspecified: Secondary | ICD-10-CM | POA: Diagnosis not present

## 2021-12-17 DIAGNOSIS — D631 Anemia in chronic kidney disease: Secondary | ICD-10-CM | POA: Diagnosis not present

## 2021-12-17 DIAGNOSIS — N186 End stage renal disease: Secondary | ICD-10-CM | POA: Diagnosis not present

## 2021-12-17 DIAGNOSIS — R197 Diarrhea, unspecified: Secondary | ICD-10-CM | POA: Diagnosis not present

## 2021-12-17 DIAGNOSIS — Z992 Dependence on renal dialysis: Secondary | ICD-10-CM | POA: Diagnosis not present

## 2021-12-17 DIAGNOSIS — N2581 Secondary hyperparathyroidism of renal origin: Secondary | ICD-10-CM | POA: Diagnosis not present

## 2021-12-19 DIAGNOSIS — N186 End stage renal disease: Secondary | ICD-10-CM | POA: Diagnosis not present

## 2021-12-19 DIAGNOSIS — R52 Pain, unspecified: Secondary | ICD-10-CM | POA: Diagnosis not present

## 2021-12-19 DIAGNOSIS — D631 Anemia in chronic kidney disease: Secondary | ICD-10-CM | POA: Diagnosis not present

## 2021-12-19 DIAGNOSIS — Z992 Dependence on renal dialysis: Secondary | ICD-10-CM | POA: Diagnosis not present

## 2021-12-19 DIAGNOSIS — L299 Pruritus, unspecified: Secondary | ICD-10-CM | POA: Diagnosis not present

## 2021-12-19 DIAGNOSIS — R197 Diarrhea, unspecified: Secondary | ICD-10-CM | POA: Diagnosis not present

## 2021-12-19 DIAGNOSIS — N2581 Secondary hyperparathyroidism of renal origin: Secondary | ICD-10-CM | POA: Diagnosis not present

## 2021-12-22 DIAGNOSIS — N2581 Secondary hyperparathyroidism of renal origin: Secondary | ICD-10-CM | POA: Diagnosis not present

## 2021-12-22 DIAGNOSIS — D631 Anemia in chronic kidney disease: Secondary | ICD-10-CM | POA: Diagnosis not present

## 2021-12-22 DIAGNOSIS — Z992 Dependence on renal dialysis: Secondary | ICD-10-CM | POA: Diagnosis not present

## 2021-12-22 DIAGNOSIS — R52 Pain, unspecified: Secondary | ICD-10-CM | POA: Diagnosis not present

## 2021-12-22 DIAGNOSIS — R197 Diarrhea, unspecified: Secondary | ICD-10-CM | POA: Diagnosis not present

## 2021-12-22 DIAGNOSIS — L299 Pruritus, unspecified: Secondary | ICD-10-CM | POA: Diagnosis not present

## 2021-12-22 DIAGNOSIS — N186 End stage renal disease: Secondary | ICD-10-CM | POA: Diagnosis not present

## 2021-12-23 DIAGNOSIS — K219 Gastro-esophageal reflux disease without esophagitis: Secondary | ICD-10-CM | POA: Diagnosis not present

## 2021-12-23 DIAGNOSIS — E7800 Pure hypercholesterolemia, unspecified: Secondary | ICD-10-CM | POA: Diagnosis not present

## 2021-12-23 DIAGNOSIS — D631 Anemia in chronic kidney disease: Secondary | ICD-10-CM | POA: Diagnosis not present

## 2021-12-23 DIAGNOSIS — N186 End stage renal disease: Secondary | ICD-10-CM | POA: Diagnosis not present

## 2021-12-23 DIAGNOSIS — E43 Unspecified severe protein-calorie malnutrition: Secondary | ICD-10-CM | POA: Diagnosis not present

## 2021-12-23 DIAGNOSIS — I722 Aneurysm of renal artery: Secondary | ICD-10-CM | POA: Diagnosis not present

## 2021-12-23 DIAGNOSIS — F418 Other specified anxiety disorders: Secondary | ICD-10-CM | POA: Diagnosis not present

## 2021-12-23 DIAGNOSIS — I5033 Acute on chronic diastolic (congestive) heart failure: Secondary | ICD-10-CM | POA: Diagnosis not present

## 2021-12-23 DIAGNOSIS — I132 Hypertensive heart and chronic kidney disease with heart failure and with stage 5 chronic kidney disease, or end stage renal disease: Secondary | ICD-10-CM | POA: Diagnosis not present

## 2021-12-24 DIAGNOSIS — R197 Diarrhea, unspecified: Secondary | ICD-10-CM | POA: Diagnosis not present

## 2021-12-24 DIAGNOSIS — N2581 Secondary hyperparathyroidism of renal origin: Secondary | ICD-10-CM | POA: Diagnosis not present

## 2021-12-24 DIAGNOSIS — L299 Pruritus, unspecified: Secondary | ICD-10-CM | POA: Diagnosis not present

## 2021-12-24 DIAGNOSIS — R52 Pain, unspecified: Secondary | ICD-10-CM | POA: Diagnosis not present

## 2021-12-24 DIAGNOSIS — N186 End stage renal disease: Secondary | ICD-10-CM | POA: Diagnosis not present

## 2021-12-24 DIAGNOSIS — D631 Anemia in chronic kidney disease: Secondary | ICD-10-CM | POA: Diagnosis not present

## 2021-12-24 DIAGNOSIS — Z992 Dependence on renal dialysis: Secondary | ICD-10-CM | POA: Diagnosis not present

## 2021-12-25 ENCOUNTER — Encounter (HOSPITAL_COMMUNITY): Payer: Self-pay

## 2021-12-25 ENCOUNTER — Emergency Department (HOSPITAL_COMMUNITY)
Admission: EM | Admit: 2021-12-25 | Discharge: 2021-12-25 | Disposition: A | Discharge status: Home / Self Care | Payer: Medicare Other | Attending: Emergency Medicine | Admitting: Emergency Medicine

## 2021-12-25 ENCOUNTER — Other Ambulatory Visit: Payer: Self-pay

## 2021-12-25 DIAGNOSIS — M10072 Idiopathic gout, left ankle and foot: Secondary | ICD-10-CM | POA: Diagnosis not present

## 2021-12-25 DIAGNOSIS — Z79899 Other long term (current) drug therapy: Secondary | ICD-10-CM | POA: Insufficient documentation

## 2021-12-25 DIAGNOSIS — I10 Essential (primary) hypertension: Secondary | ICD-10-CM | POA: Diagnosis not present

## 2021-12-25 DIAGNOSIS — N186 End stage renal disease: Secondary | ICD-10-CM | POA: Insufficient documentation

## 2021-12-25 DIAGNOSIS — I132 Hypertensive heart and chronic kidney disease with heart failure and with stage 5 chronic kidney disease, or end stage renal disease: Secondary | ICD-10-CM | POA: Insufficient documentation

## 2021-12-25 DIAGNOSIS — I2510 Atherosclerotic heart disease of native coronary artery without angina pectoris: Secondary | ICD-10-CM | POA: Insufficient documentation

## 2021-12-25 DIAGNOSIS — Z992 Dependence on renal dialysis: Secondary | ICD-10-CM | POA: Diagnosis not present

## 2021-12-25 DIAGNOSIS — R0602 Shortness of breath: Secondary | ICD-10-CM | POA: Insufficient documentation

## 2021-12-25 DIAGNOSIS — F419 Anxiety disorder, unspecified: Secondary | ICD-10-CM | POA: Diagnosis not present

## 2021-12-25 DIAGNOSIS — I509 Heart failure, unspecified: Secondary | ICD-10-CM | POA: Insufficient documentation

## 2021-12-25 DIAGNOSIS — R457 State of emotional shock and stress, unspecified: Secondary | ICD-10-CM | POA: Diagnosis not present

## 2021-12-25 LAB — CBC WITH DIFFERENTIAL/PLATELET
Abs Immature Granulocytes: 0.01 10*3/uL (ref 0.00–0.07)
Basophils Absolute: 0 10*3/uL (ref 0.0–0.1)
Basophils Relative: 1 %
Eosinophils Absolute: 0.3 10*3/uL (ref 0.0–0.5)
Eosinophils Relative: 8 %
HCT: 36.3 % (ref 36.0–46.0)
Hemoglobin: 11 g/dL — ABNORMAL LOW (ref 12.0–15.0)
Immature Granulocytes: 0 %
Lymphocytes Relative: 34 %
Lymphs Abs: 1.6 10*3/uL (ref 0.7–4.0)
MCH: 29.3 pg (ref 26.0–34.0)
MCHC: 30.3 g/dL (ref 30.0–36.0)
MCV: 96.8 fL (ref 80.0–100.0)
Monocytes Absolute: 0.6 10*3/uL (ref 0.1–1.0)
Monocytes Relative: 13 %
Neutro Abs: 2 10*3/uL (ref 1.7–7.7)
Neutrophils Relative %: 44 %
Platelets: 206 10*3/uL (ref 150–400)
RBC: 3.75 MIL/uL — ABNORMAL LOW (ref 3.87–5.11)
RDW: 18.2 % — ABNORMAL HIGH (ref 11.5–15.5)
WBC: 4.5 10*3/uL (ref 4.0–10.5)
nRBC: 0 % (ref 0.0–0.2)

## 2021-12-25 LAB — BASIC METABOLIC PANEL
Anion gap: 15 (ref 5–15)
BUN: 18 mg/dL (ref 8–23)
CO2: 27 mmol/L (ref 22–32)
Calcium: 8.4 mg/dL — ABNORMAL LOW (ref 8.9–10.3)
Chloride: 95 mmol/L — ABNORMAL LOW (ref 98–111)
Creatinine, Ser: 3.58 mg/dL — ABNORMAL HIGH (ref 0.44–1.00)
GFR, Estimated: 13 mL/min — ABNORMAL LOW (ref >60)
Glucose, Bld: 76 mg/dL (ref 70–99)
Potassium: 4.3 mmol/L (ref 3.5–5.1)
Sodium: 137 mmol/L (ref 135–145)

## 2021-12-25 IMAGING — CR DG CHEST 2V
2 series · 2 of 2 positions shown · non-contrast
Comparison: Chest radiographs 04/26/2021 and earlier.

CLINICAL DATA: 69-year-old female with shortness of breath and
nonproductive cough.

EXAM:
CHEST - 2 VIEW

[chest lat]
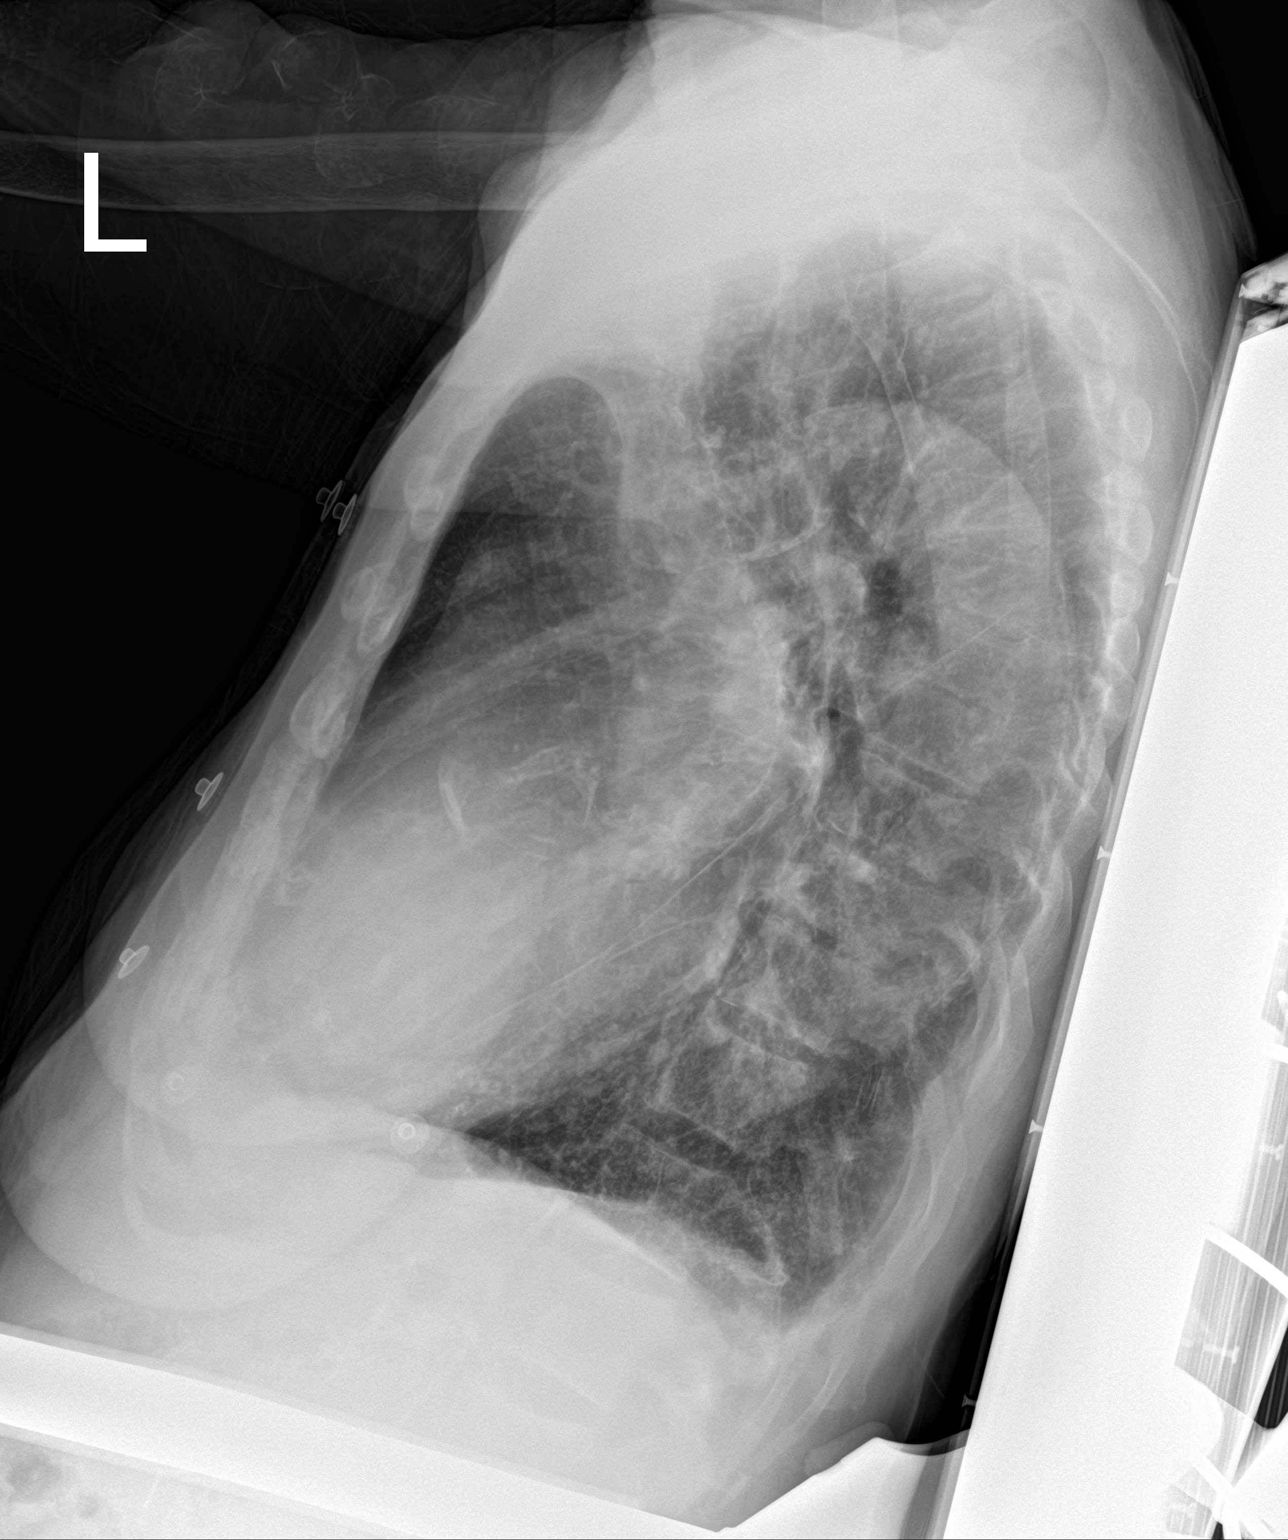

[chest ap]
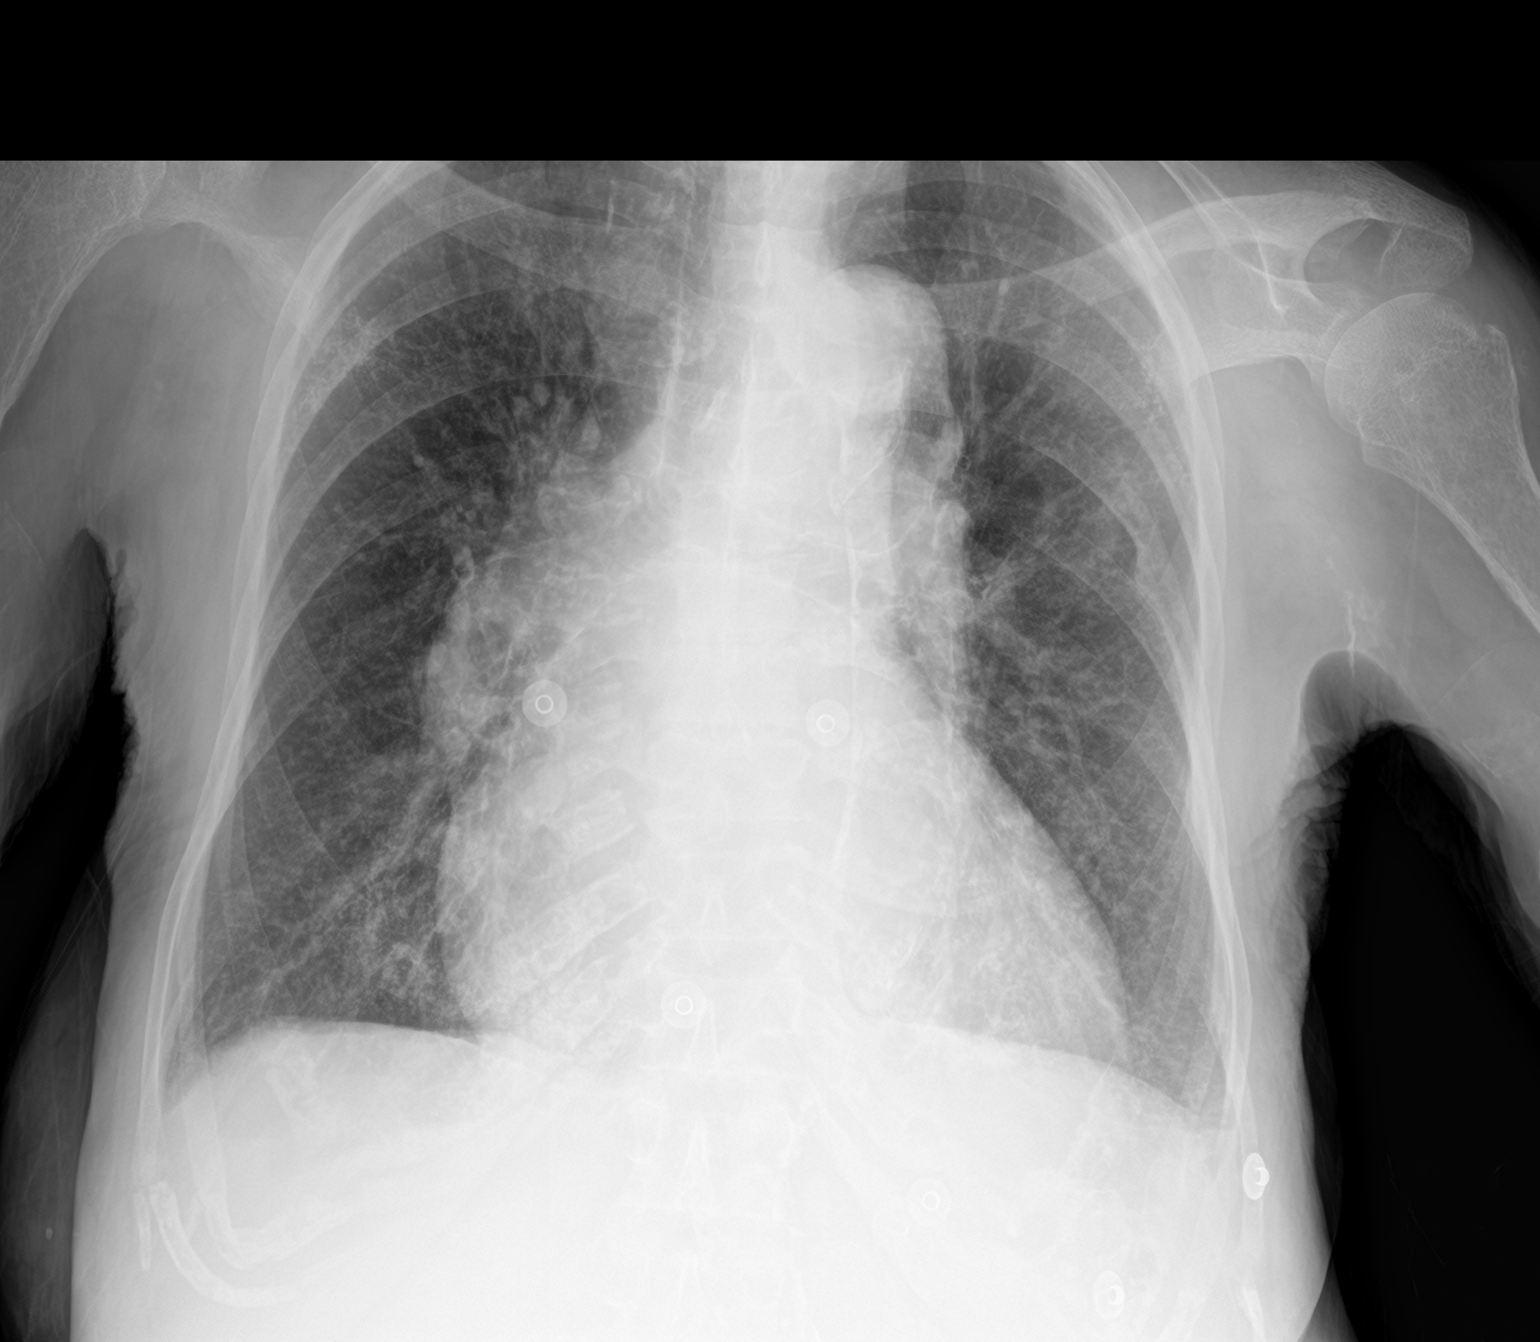

[2 of 2 positions shown; findings below may reference images not displayed]

FINDINGS: Stable cardiomegaly, tortuous and calcified thoracic aorta. Other
mediastinal contours are within normal limits. Visualized tracheal
air column is within normal limits. Stable large lung volumes. But
bilateral reticulonodular opacity has regressed since [place], and the
residual is likely chronic. No pneumothorax or pleural effusion. No
consolidation. Stable visualized osseous structures. Negative
visible bowel gas pattern.
IMPRESSION: 1. Chronic cardiomegaly, pulmonary hyperinflation, and suspected
chronic interstitial disease. No acute cardiopulmonary abnormality.
2.  Aortic Atherosclerosis ([id number]).

## 2021-12-25 NOTE — ED Triage Notes (Signed)
Pt BIB [facility] from home c/o of anxiety and SHOB. Pt states she hasn't been able to sleep all night. Pt denies any pain. Pt laying in the bed with normal work of breathing appearing to be in no type of distress.

## 2021-12-25 NOTE — ED Provider Notes (Signed)
[facility] EMERGENCY DEPARTMENT Provider Note   CSN: [id number] Arrival date & time: 12/25/21  0748     History  Chief Complaint  Patient presents with   Anxiety    [name] is a 71 y.o. female with past medical history significant for ESRD on hemodialysis MWF, hypertension, hyperlipidemia, CHF, CAD, history of CVA presents to the ED for evaluation of shortness of breath.  Patient states that this started last night and she believes it is related to her anxiety.  She states her anxiety has been getting worse and probably needs to follow-up with her primary care doctor.  On evaluation, she is requesting oxygen supplementation with 100% O2 saturations with no increased work of breathing or effort.  She also complains of a gout flareup in her left foot.  She denies chest pain, headache, vision changes, abdominal pain, nausea, vomiting, diarrhea.  No recent illness or long travel.   Anxiety Associated symptoms include shortness of breath. Pertinent negatives include no abdominal pain and no headaches.      Home Medications Prior to Admission medications   Medication Sig Start Date End Date Taking? Authorizing Provider  acetaminophen (TYLENOL) 500 MG tablet Take 1 tablet (500 mg total) by mouth every 6 (six) hours as needed. Patient taking differently: Take 1,000 mg by mouth every 6 (six) hours as needed for mild pain. 09/05/18   Law, Bea Graff, PA-C  albuterol (VENTOLIN HFA) 108 (90 Base) MCG/ACT inhaler Inhale 1-2 puffs into the lungs every 6 (six) hours as needed for wheezing or shortness of breath. 10/26/21   Jeanell Sparrow, DO  allopurinol (ZYLOPRIM) 300 MG tablet Take 300 mg by mouth daily. 11/03/21   [provider]  ALPRAZolam Duanne Moron) 0.25 MG tablet Take 1 tablet (0.25 mg total) by mouth daily as needed for anxiety. 08/18/21   Nita Sells, MD  atorvastatin (LIPITOR) 80 MG tablet TAKE 1 TABLET (80 MG TOTAL) BY MOUTH DAILY. Patient not  taking: Reported on 11/08/2021 02/25/21 02/25/22  Dessa Phi, DO  cinacalcet (SENSIPAR) 30 MG tablet Take 30 mg by mouth 3 (three) times a week. Mon, Wed, Fri-- Given @ Dialysis 01/04/20   [provider]  doxycycline (VIBRAMYCIN) 100 MG capsule Take 1 capsule (100 mg total) by mouth 2 (two) times daily. 11/28/21   Quintella Reichert, MD  folic acid-vitamin b complex-vitamin c-selenium-zinc (DIALYVITE) 3 MG TABS tablet Take 1 tablet by mouth daily.    [provider]  losartan (COZAAR) 50 MG tablet Take 50 mg by mouth daily. 11/03/21   [provider]  ondansetron (ZOFRAN) 8 MG tablet Take 8 mg by mouth 2 (two) times daily as needed for nausea or vomiting.     [provider]  pantoprazole (PROTONIX) 40 MG tablet Take 1 tablet (40 mg total) by mouth daily. 08/18/21   Nita Sells, MD  zolpidem (AMBIEN) 10 MG tablet Take 10 mg by mouth at bedtime as needed for sleep. 02/13/21   [provider]      Allergies    Sulfa antibiotics and Adhesive [tape]    Review of Systems   Review of Systems  Constitutional:  Negative for fever.  HENT: Negative.    Eyes: Negative.   Respiratory:  Positive for shortness of breath.   Cardiovascular: Negative.   Gastrointestinal:  Negative for abdominal pain and vomiting.  Endocrine: Negative.   Genitourinary: Negative.   Musculoskeletal: Negative.   Skin:  Negative for rash.  Neurological:  Negative for headaches.  All other systems reviewed and are negative.  Physical Exam Updated Vital Signs BP 113/64 (BP Location: Left Leg)    Pulse 80    Temp 97.8 F (36.6 C) (Oral)    Resp 14    Ht 5\' 2"  (1.575 m)    Wt 54.4 kg    SpO2 100%    BMI 21.95 kg/m  Physical Exam Vitals and nursing note reviewed.  Constitutional:      General: She is not in acute distress.    Appearance: She is not ill-appearing.     Comments: Chronically ill-appearing, with a working AV graft in the right upper extremity and a nonworking  AV graft in the left upper extremity  HENT:     Head: Atraumatic.  Eyes:     Conjunctiva/sclera: Conjunctivae normal.  Cardiovascular:     Rate and Rhythm: Normal rate and regular rhythm.     Pulses:          Dorsalis pedis pulses are 2+ on the right side and 1+ on the left side.     Heart sounds: No murmur heard.    Comments: Lung clear to ausculation bilaterally. No tachypnea, no accessory muscle use, no acute distress, no increased work of breathing, no decrease in air movement  Pulmonary:     Effort: Pulmonary effort is normal. No respiratory distress.     Breath sounds: Normal breath sounds.  Abdominal:     General: Abdomen is flat. There is no distension.     Palpations: Abdomen is soft.     Tenderness: There is no abdominal tenderness.  Musculoskeletal:        General: Normal range of motion.     Cervical back: Normal range of motion.  Feet:     Comments: Left foot is dark red, scaling.  There is +1 DP pulses and +2 PT pulses bilaterally. Skin:    General: Skin is warm and dry.     Capillary Refill: Capillary refill takes less than 2 seconds.  Neurological:     General: No focal deficit present.     Mental Status: She is alert.  Psychiatric:        Mood and Affect: Mood normal.    ED Results / Procedures / Treatments   Labs (all labs ordered are listed, but only abnormal results are displayed) Labs Reviewed  BASIC METABOLIC PANEL  CBC WITH DIFFERENTIAL/PLATELET    EKG EKG Interpretation  Date/Time:  Thursday December 25 2021 07:58:24 EST Ventricular Rate:  78 PR Interval:  187 QRS Duration: 99 QT Interval:  485 QTC Calculation: 553 R Axis:   102 Text Interpretation: Sinus rhythm Right axis deviation Consider left ventricular hypertrophy Prolonged QT interval similar to Dec 15 2021 Confirmed by Sherwood Gambler [phone]) on 12/25/2021 8:01:34 AM  Radiology No results found.  Procedures Procedures    Medications Ordered in ED Medications - No data to  display  ED Course/ Medical Decision Making/ A&P Clinical Course as of 12/25/21 1121  Thu Dec 25, 2021  1101 CBC with Differential(!) CBC unremarkable, without signs of infection or anemia. [EC]  1108 BMP at patient's baseline [EC]    Clinical Course User Index [EC] Tonye Pearson, PA-C                           Medical Decision Making JANYRA BARILLAS is a 71 y.o. female with past medical history significant for ESRD on hemodialysis MWF, hypertension, hyperlipidemia, CHF,  CAD, history of CVA presents to the ED for evaluation of shortness of breath.  Patient states that this started last night and she believes it is related to her anxiety.  She states her anxiety has been getting worse and probably needs to follow-up with her primary care doctor.  On evaluation, she is requesting oxygen supplementation with 100% O2 saturations with no increased work of breathing or effort.  She also complains of a gout flareup in her left foot.  She denies chest pain, headache, vision changes, abdominal pain, nausea, vomiting, diarrhea.  No recent illness or long travel. Patient has been seen 8 times over the last month for similar complaints.  Each time symptoms appear to have resolved upon arrival.  Today, her vitals are stable, she does not appear dehydrated or fluid overloaded.  Pulmonary exam was normal.  She appears in no acute respiratory distress. This patient presents to the ED for concern of shortness of breath, this involves an extensive number of treatment options, and is a complaint that carries with it a high risk of complications and morbidity.  The emergent differential diagnosis for shortness of breath includes, but is not limited to, Pulmonary edema, bronchoconstriction, Pneumonia, Pulmonary embolism, Pneumotherax/ Hemothorax, Dysrythmia, ACS.     Additional history obtained:  External records from outside source obtained and reviewed including recent labs and imaging. Details: Patient seen  in ED most recently on 12/15/2021 with similar complaint.  Work-up was normal.  No medication indicated at this time   Lab Tests:  I Ordered, reviewed, and interpreted labs.  The pertinent results are detailed in the ED course above.   Imaging Studies ordered:  No imaging indicated at this time   Cardiac Monitoring:  The patient was maintained on a cardiac monitor.  I personally viewed and interpreted the cardiac monitored which showed an underlying rhythm of: NSR EKG normal   Dispostion:  After consideration of the diagnostic results and the patients response to treatment feel that the patent would benefit from discharge with outpatient follow-up. 1.  Atypical chest pain, anxiety-work-up was overall very unremarkable.  She was asymptomatic at time of examination without any treatment.  Patient admits that she feels like this is her anxiety and that she needs to follow-up with her primary care doctor.  I did basic labs given her history of ESRD, however these returned normal at her baseline.  I discussed with patient and her need to follow-up with her primary care doctor to discuss possible medications for anxiety.  Strict return precautions discussed.  All questions asked and answered.  Discharged home in stable condition. 2.  Acute gout flareup, left foot: Patient has been taking allopurinol for gout but she has a flare of right now.  Foot is tender, erythematous.  Given her ESRD, I do not feel that prescribing NSAIDs here in the ED is a reasonable option.  Again, believe she should talk with her primary care doctor to discuss management of gout.  At this point, there is no emergent need to keep patient here for gout treatment. Patient understanding and amenable to plan.  Discharged home in good condition. Final Clinical Impression(s) / ED Diagnoses Final diagnoses:  Anxiety  Acute idiopathic gout of left foot    Rx / DC Orders ED Discharge Orders     None         Tonye Pearson, PA-C 12/25/21 1126    Sherwood Gambler, MD 12/26/21 [phone]

## 2021-12-25 NOTE — Discharge Instructions (Addendum)
Your symptoms today are likely due to anxiety.  You seem to be having these more frequently over the last several weeks, and we discussed that you really need to follow-up with your primary care provider to potentially get on any medications.  Your labs looked normal today as well as your EKG.  Given your reassuring work-up and exam, feel that you are safe to discharge home.  Continue going to your dialysis appointments, and return if you develop worsening chest pain or shortness of breath.   Please schedule that appointment with your primary care doctor soon as possible.  Additionally, you mentioned that you are having a flareup of your gout despite taking your allopurinol.  Given your renal disease, I do not feel comfortable giving you an NSAID here in the ED.  Again this is a great question for your primary care doctor on what you can use to help manage your symptoms of your gout flare.

## 2021-12-26 DIAGNOSIS — L299 Pruritus, unspecified: Secondary | ICD-10-CM | POA: Diagnosis not present

## 2021-12-26 DIAGNOSIS — N186 End stage renal disease: Secondary | ICD-10-CM | POA: Diagnosis not present

## 2021-12-26 DIAGNOSIS — D631 Anemia in chronic kidney disease: Secondary | ICD-10-CM | POA: Diagnosis not present

## 2021-12-26 DIAGNOSIS — Z992 Dependence on renal dialysis: Secondary | ICD-10-CM | POA: Diagnosis not present

## 2021-12-26 DIAGNOSIS — R197 Diarrhea, unspecified: Secondary | ICD-10-CM | POA: Diagnosis not present

## 2021-12-26 DIAGNOSIS — R52 Pain, unspecified: Secondary | ICD-10-CM | POA: Diagnosis not present

## 2021-12-26 DIAGNOSIS — N2581 Secondary hyperparathyroidism of renal origin: Secondary | ICD-10-CM | POA: Diagnosis not present

## 2021-12-28 ENCOUNTER — Emergency Department (HOSPITAL_COMMUNITY): Payer: Medicare Other

## 2021-12-28 ENCOUNTER — Other Ambulatory Visit: Payer: Self-pay

## 2021-12-28 ENCOUNTER — Emergency Department (HOSPITAL_COMMUNITY)
Admission: EM | Admit: 2021-12-28 | Discharge: 2021-12-28 | Disposition: A | Discharge status: Home / Self Care | Payer: Medicare Other | Attending: Emergency Medicine | Admitting: Emergency Medicine

## 2021-12-28 ENCOUNTER — Encounter (HOSPITAL_COMMUNITY): Payer: Self-pay | Admitting: *Deleted

## 2021-12-28 DIAGNOSIS — R079 Chest pain, unspecified: Secondary | ICD-10-CM

## 2021-12-28 DIAGNOSIS — R1011 Right upper quadrant pain: Secondary | ICD-10-CM | POA: Diagnosis not present

## 2021-12-28 DIAGNOSIS — I132 Hypertensive heart and chronic kidney disease with heart failure and with stage 5 chronic kidney disease, or end stage renal disease: Secondary | ICD-10-CM | POA: Diagnosis not present

## 2021-12-28 DIAGNOSIS — Z992 Dependence on renal dialysis: Secondary | ICD-10-CM | POA: Insufficient documentation

## 2021-12-28 DIAGNOSIS — I2510 Atherosclerotic heart disease of native coronary artery without angina pectoris: Secondary | ICD-10-CM | POA: Diagnosis not present

## 2021-12-28 DIAGNOSIS — R0789 Other chest pain: Secondary | ICD-10-CM | POA: Diagnosis not present

## 2021-12-28 DIAGNOSIS — N186 End stage renal disease: Secondary | ICD-10-CM | POA: Diagnosis not present

## 2021-12-28 DIAGNOSIS — R1084 Generalized abdominal pain: Secondary | ICD-10-CM

## 2021-12-28 DIAGNOSIS — I509 Heart failure, unspecified: Secondary | ICD-10-CM | POA: Insufficient documentation

## 2021-12-28 DIAGNOSIS — I517 Cardiomegaly: Secondary | ICD-10-CM | POA: Diagnosis not present

## 2021-12-28 LAB — CBC
HCT: 34.9 % — ABNORMAL LOW (ref 36.0–46.0)
Hemoglobin: 11.2 g/dL — ABNORMAL LOW (ref 12.0–15.0)
MCH: 30.2 pg (ref 26.0–34.0)
MCHC: 32.1 g/dL (ref 30.0–36.0)
MCV: 94.1 fL (ref 80.0–100.0)
Platelets: 166 10*3/uL (ref 150–400)
RBC: 3.71 MIL/uL — ABNORMAL LOW (ref 3.87–5.11)
RDW: 17.9 % — ABNORMAL HIGH (ref 11.5–15.5)
WBC: 5.5 10*3/uL (ref 4.0–10.5)
nRBC: 0 % (ref 0.0–0.2)

## 2021-12-28 LAB — COMPREHENSIVE METABOLIC PANEL
ALT: 10 U/L (ref 0–44)
AST: 21 U/L (ref 15–41)
Albumin: 3.1 g/dL — ABNORMAL LOW (ref 3.5–5.0)
Alkaline Phosphatase: 199 U/L — ABNORMAL HIGH (ref 38–126)
Anion gap: 16 — ABNORMAL HIGH (ref 5–15)
BUN: 54 mg/dL — ABNORMAL HIGH (ref 8–23)
CO2: 28 mmol/L (ref 22–32)
Calcium: 8.1 mg/dL — ABNORMAL LOW (ref 8.9–10.3)
Chloride: 97 mmol/L — ABNORMAL LOW (ref 98–111)
Creatinine, Ser: 5.63 mg/dL — ABNORMAL HIGH (ref 0.44–1.00)
GFR, Estimated: 8 mL/min — ABNORMAL LOW (ref >60)
Glucose, Bld: 83 mg/dL (ref 70–99)
Potassium: 4.3 mmol/L (ref 3.5–5.1)
Sodium: 141 mmol/L (ref 135–145)
Total Bilirubin: 0.9 mg/dL (ref 0.3–1.2)
Total Protein: 7.1 g/dL (ref 6.5–8.1)

## 2021-12-28 LAB — TROPONIN I (HIGH SENSITIVITY)
Troponin I (High Sensitivity): 22 ng/L — ABNORMAL HIGH (ref <18)
Troponin I (High Sensitivity): 24 ng/L — ABNORMAL HIGH (ref <18)

## 2021-12-28 LAB — LIPASE, BLOOD: Lipase: 53 U/L — ABNORMAL HIGH (ref 11–51)

## 2021-12-28 MED ORDER — ONDANSETRON 4 MG PO TBDP
4.0000 mg | ORAL_TABLET | Freq: Once | ORAL | Status: AC
  Administered 2021-12-28: 4 mg via ORAL
  Filled 2021-12-28: qty 1

## 2021-12-28 NOTE — ED Triage Notes (Signed)
Pt arrives from home via [facility], per report, the pt has had Lower abdominal pain for 12 hours, central chest pain that started this morning, non radiating, 85hr, rr 97% RA, 158/74.

## 2021-12-28 NOTE — Discharge Instructions (Signed)
All your blood work looks okay today.  Your x-ray does not show any signs of fluid.  Will be important for you to go to dialysis tomorrow.  Continue to take your medication.  If you have any worsening of your symptoms return to the emergency room.

## 2021-12-28 NOTE — ED Provider Notes (Signed)
[facility] EMERGENCY DEPARTMENT Provider Note   CSN: [id number] Arrival date & time: 12/28/21  0932     History  Chief Complaint  Patient presents with   Abdominal Pain    [name] is a 71 y.o. female.  Patient is a 71 year old female with a history of anxiety depression, end-stage renal disease on dialysis Monday Wednesday Friday, CHF, hypertension, CAD who is presenting today with several complaints.  She reported she had abdominal pain yesterday and did have some vomiting at home but this morning after she had already gotten up she started feeling very short of breath.  She reports she took 2 aspirin but then had an episode of emesis and called 911 because she could not catch her breath.  Patient reports that since EMS had arrived she started to feel better.  She had a bowel movement and reports her abdominal pain has improved some.  She does not use inhalers at home and reports she was having some chest pain at home but denies any chest pain currently.  It does not radiate into her arms.  It feels like pain she has had in the past.  She has not had recent cough, congestion or fever.  She did have a boil on her left foot but she reports it is healing and on the mend.  Last full dialysis was on Friday  The history is provided by the patient and the EMS personnel.  Abdominal Pain     Home Medications Prior to Admission medications   Medication Sig Start Date End Date Taking? Authorizing Provider  acetaminophen (TYLENOL) 500 MG tablet Take 1 tablet (500 mg total) by mouth every 6 (six) hours as needed. Patient taking differently: Take 1,000 mg by mouth every 6 (six) hours as needed for mild pain. 09/05/18   Law, Bea Graff, PA-C  albuterol (VENTOLIN HFA) 108 (90 Base) MCG/ACT inhaler Inhale 1-2 puffs into the lungs every 6 (six) hours as needed for wheezing or shortness of breath. 10/26/21   Jeanell Sparrow, DO  allopurinol (ZYLOPRIM) 300 MG tablet Take 300 mg  by mouth daily. 11/03/21   [provider]  ALPRAZolam Duanne Moron) 0.25 MG tablet Take 1 tablet (0.25 mg total) by mouth daily as needed for anxiety. 08/18/21   Nita Sells, MD  atorvastatin (LIPITOR) 80 MG tablet TAKE 1 TABLET (80 MG TOTAL) BY MOUTH DAILY. Patient not taking: Reported on 11/08/2021 02/25/21 02/25/22  Dessa Phi, DO  cinacalcet (SENSIPAR) 30 MG tablet Take 30 mg by mouth 3 (three) times a week. Mon, Wed, Fri-- Given @ Dialysis 01/04/20   [provider]  doxycycline (VIBRAMYCIN) 100 MG capsule Take 1 capsule (100 mg total) by mouth 2 (two) times daily. 11/28/21   Quintella Reichert, MD  folic acid-vitamin b complex-vitamin c-selenium-zinc (DIALYVITE) 3 MG TABS tablet Take 1 tablet by mouth daily.    [provider]  losartan (COZAAR) 50 MG tablet Take 50 mg by mouth daily. 11/03/21   [provider]  ondansetron (ZOFRAN) 8 MG tablet Take 8 mg by mouth 2 (two) times daily as needed for nausea or vomiting.     [provider]  pantoprazole (PROTONIX) 40 MG tablet Take 1 tablet (40 mg total) by mouth daily. 08/18/21   Nita Sells, MD  zolpidem (AMBIEN) 10 MG tablet Take 10 mg by mouth at bedtime as needed for sleep. 02/13/21   [provider]      Allergies    Sulfa antibiotics and  Adhesive [tape]    Review of Systems   Review of Systems  Gastrointestinal:  Positive for abdominal pain.   Physical Exam Updated Vital Signs BP (!) 161/79 (BP Location: Left Leg)    Pulse 88    Temp (!) 97.5 F (36.4 C) (Oral)    Resp 17    SpO2 100%  Physical Exam Vitals and nursing note reviewed.  Constitutional:      General: She is not in acute distress.    Appearance: She is well-developed.     Comments: Chronically ill-appearing female  HENT:     Head: Normocephalic and atraumatic.  Eyes:     Pupils: Pupils are equal, round, and reactive to light.  Cardiovascular:     Rate and Rhythm: Normal rate and regular rhythm.      Pulses: Normal pulses.     Heart sounds: Normal heart sounds. No murmur heard.   No friction rub.  Pulmonary:     Effort: Pulmonary effort is normal.     Breath sounds: Examination of the right-lower field reveals rales. Examination of the left-lower field reveals rales. Rales present. No wheezing.  Abdominal:     General: Abdomen is flat. Bowel sounds are normal. There is no distension.     Palpations: Abdomen is soft. There is hepatomegaly.     Tenderness: There is abdominal tenderness in the right upper quadrant. There is no guarding or rebound.     Comments: Patient has hepatomegaly with mild tenderness on exam otherwise abdomen is soft and nontender  Musculoskeletal:        General: No tenderness. Normal range of motion.     Cervical back: Normal range of motion and neck supple.     Comments: No notable edema in the lower extremities.  Healing abscess of the left foot with some improving erythema per the patient  Skin:    General: Skin is warm and dry.     Coloration: Skin is pale.     Findings: No rash.  Neurological:     Mental Status: She is alert and oriented to person, place, and time. Mental status is at baseline.     Cranial Nerves: No cranial nerve deficit.  Psychiatric:        Mood and Affect: Mood normal.        Behavior: Behavior normal.    ED Results / Procedures / Treatments   Labs (all labs ordered are listed, but only abnormal results are displayed) Labs Reviewed  LIPASE, BLOOD - Abnormal; Notable for the following components:      Result Value   Lipase 53 (*)    All other components within normal limits  COMPREHENSIVE METABOLIC PANEL - Abnormal; Notable for the following components:   Chloride 97 (*)    BUN 54 (*)    Creatinine, Ser 5.63 (*)    Calcium 8.1 (*)    Albumin 3.1 (*)    Alkaline Phosphatase 199 (*)    GFR, Estimated 8 (*)    Anion gap 16 (*)    All other components within normal limits  CBC - Abnormal; Notable for the following components:    RBC 3.71 (*)    Hemoglobin 11.2 (*)    HCT 34.9 (*)    RDW 17.9 (*)    All other components within normal limits  TROPONIN I (HIGH SENSITIVITY) - Abnormal; Notable for the following components:   Troponin I (High Sensitivity) 22 (*)    All other components within normal limits  URINALYSIS, ROUTINE W REFLEX MICROSCOPIC  TROPONIN I (HIGH SENSITIVITY)    EKG EKG Interpretation  Date/Time:  Sunday December 28 2021 07:22:08 EST Ventricular Rate:  85 PR Interval:  190 QRS Duration: 80 QT Interval:  438 QTC Calculation: 521 R Axis:   70 Text Interpretation: Normal sinus rhythm Nonspecific ST abnormality Prolonged QT No significant change since last tracing When compared with ECG of 25-Dec-2021 07:58, PREVIOUS ECG IS PRESENT Confirmed by Blanchie Dessert [phone]) on 12/28/2021 9:59:21 AM  Radiology DG Chest 2 View  Result Date: 12/28/2021 CLINICAL DATA:  Chest pain EXAM: CHEST - 2 VIEW COMPARISON:  Thirteen days ago FINDINGS: Cardiomegaly and aortic tortuosity. Stable generalized interstitial coarsening. There is no edema, consolidation, effusion, or pneumothorax. Large lung volumes. IMPRESSION: 1. No acute finding when compared to prior. 2. Cardiomegaly and chronic lung disease. Electronically Signed   By: Jorje Guild M.D.   On: 12/28/2021 07:56    Procedures Procedures    Medications Ordered in ED Medications  ondansetron (ZOFRAN-ODT) disintegrating tablet 4 mg (has no administration in time range)    ED Course/ Medical Decision Making/ A&P                           Medical Decision Making  Patient is a 71 year old elderly female with multiple medical problems presenting today with several complaints 1 of shortness of breath which she reports is now resolved since she has come to the emergency room, some intermittent chest pain this morning which is nonspecific in nature started at rest and stopped at rest.  And some abdominal discomfort.  She reports she did have abdominal  discomfort yesterday and had vomiting today but now reports she feels better.  She does have Zofran at home which she does use when she feels nauseated but did not use it today.  On exam she does have some hepatomegaly with some mild tenderness but no other acute abdominal findings.  She has had prior cholecystectomy.  She is seen in the emergency room fairly frequently and has recurrent CTs of her abdomen and pelvis for done since August and at one point did have some diverticulitis but her last CT done on 5 December showed no acute findings.  She does not have any peritoneal signs today on exam and reports she starting to feel better and requesting something to eat.  Patient last had dialysis on Friday a full course.  She does not appear significantly fluid overloaded today.  She does have some mild rales in her bases but is satting 100% with normal respiratory rate.  She is due for dialysis tomorrow.  I independently evaluated and interpreted patient's chest x-ray today and she has persistent cardiomegaly but no significant pleural effusions or edema.  Cardiomegaly seems to be stable when compared to prior chest x-rays.  Low suspicion for tamponade pathology today.  I independently evaluated patient's labs in today patient's lipase is 53 which is what it has been similar to prior tests.  CMP with findings consistent with end-stage renal disease with BUN of 54 and creatinine of 5 but normal potassium of 4.3 and only minimal anion gap of 16 suspect gap is related to uremia consistent with end-stage renal disease.  CBC today with stable hemoglobin of 11 no normal white count.  I independently interpreted patient's EKG which shows no acute changes today.  She is well-appearing on exam.  She will need a delta troponin given symptoms started prior to  arrival today.  Initial troponin was 22 which based on prior troponins is better than her baseline.  Patient will be p.o. challenged.  While she is sitting in bed her  oxygen saturation was 100%.  Low suspicion for PE today.  12:28 PM On evaluation for reassessment patient is sleeping comfortably in the room satting 92% while sleeping.  When awakened she reports she feels better.  She has eaten here and has had no additional abdominal pain or vomiting.  Delta troponin is still reassuring at 24 and no significant change.  Still better than her baseline.  Patient would like to go home.  She will have dialysis tomorrow.  She does have multiple comorbidities however at this time she appears to be at her baseline and stable for discharge.  She has no social barriers preventing her discharge.        Final Clinical Impression(s) / ED Diagnoses Final diagnoses:  Generalized abdominal pain  Nonspecific chest pain    Rx / DC Orders ED Discharge Orders     None         Blanchie Dessert, MD 12/28/21 1238

## 2021-12-28 NOTE — ED Triage Notes (Addendum)
Pt says that she was having some abdominal pain, had a bowel movement just prior to EMS arrival and she feels somewhat better. She is having central chest pain, reports may be related to gas since she was using the bathroom and cp is resolved.

## 2021-12-29 DIAGNOSIS — R197 Diarrhea, unspecified: Secondary | ICD-10-CM | POA: Diagnosis not present

## 2021-12-29 DIAGNOSIS — D631 Anemia in chronic kidney disease: Secondary | ICD-10-CM | POA: Diagnosis not present

## 2021-12-29 DIAGNOSIS — N186 End stage renal disease: Secondary | ICD-10-CM | POA: Diagnosis not present

## 2021-12-29 DIAGNOSIS — R52 Pain, unspecified: Secondary | ICD-10-CM | POA: Diagnosis not present

## 2021-12-29 DIAGNOSIS — L299 Pruritus, unspecified: Secondary | ICD-10-CM | POA: Diagnosis not present

## 2021-12-29 DIAGNOSIS — Z992 Dependence on renal dialysis: Secondary | ICD-10-CM | POA: Diagnosis not present

## 2021-12-29 DIAGNOSIS — N2581 Secondary hyperparathyroidism of renal origin: Secondary | ICD-10-CM | POA: Diagnosis not present

## 2021-12-31 DIAGNOSIS — R197 Diarrhea, unspecified: Secondary | ICD-10-CM | POA: Diagnosis not present

## 2021-12-31 DIAGNOSIS — R52 Pain, unspecified: Secondary | ICD-10-CM | POA: Diagnosis not present

## 2021-12-31 DIAGNOSIS — D631 Anemia in chronic kidney disease: Secondary | ICD-10-CM | POA: Diagnosis not present

## 2021-12-31 DIAGNOSIS — N186 End stage renal disease: Secondary | ICD-10-CM | POA: Diagnosis not present

## 2021-12-31 DIAGNOSIS — Z992 Dependence on renal dialysis: Secondary | ICD-10-CM | POA: Diagnosis not present

## 2021-12-31 DIAGNOSIS — L299 Pruritus, unspecified: Secondary | ICD-10-CM | POA: Diagnosis not present

## 2021-12-31 DIAGNOSIS — N2581 Secondary hyperparathyroidism of renal origin: Secondary | ICD-10-CM | POA: Diagnosis not present

## 2022-01-02 DIAGNOSIS — N186 End stage renal disease: Secondary | ICD-10-CM | POA: Diagnosis not present

## 2022-01-02 DIAGNOSIS — Z992 Dependence on renal dialysis: Secondary | ICD-10-CM | POA: Diagnosis not present

## 2022-01-02 DIAGNOSIS — D631 Anemia in chronic kidney disease: Secondary | ICD-10-CM | POA: Diagnosis not present

## 2022-01-02 DIAGNOSIS — R197 Diarrhea, unspecified: Secondary | ICD-10-CM | POA: Diagnosis not present

## 2022-01-02 DIAGNOSIS — N2581 Secondary hyperparathyroidism of renal origin: Secondary | ICD-10-CM | POA: Diagnosis not present

## 2022-01-02 DIAGNOSIS — R52 Pain, unspecified: Secondary | ICD-10-CM | POA: Diagnosis not present

## 2022-01-02 DIAGNOSIS — L299 Pruritus, unspecified: Secondary | ICD-10-CM | POA: Diagnosis not present

## 2022-01-05 DIAGNOSIS — Z992 Dependence on renal dialysis: Secondary | ICD-10-CM | POA: Diagnosis not present

## 2022-01-05 DIAGNOSIS — R52 Pain, unspecified: Secondary | ICD-10-CM | POA: Diagnosis not present

## 2022-01-05 DIAGNOSIS — L299 Pruritus, unspecified: Secondary | ICD-10-CM | POA: Diagnosis not present

## 2022-01-05 DIAGNOSIS — R197 Diarrhea, unspecified: Secondary | ICD-10-CM | POA: Diagnosis not present

## 2022-01-05 DIAGNOSIS — N2581 Secondary hyperparathyroidism of renal origin: Secondary | ICD-10-CM | POA: Diagnosis not present

## 2022-01-05 DIAGNOSIS — N186 End stage renal disease: Secondary | ICD-10-CM | POA: Diagnosis not present

## 2022-01-05 DIAGNOSIS — D631 Anemia in chronic kidney disease: Secondary | ICD-10-CM | POA: Diagnosis not present

## 2022-01-07 DIAGNOSIS — R52 Pain, unspecified: Secondary | ICD-10-CM | POA: Diagnosis not present

## 2022-01-07 DIAGNOSIS — D631 Anemia in chronic kidney disease: Secondary | ICD-10-CM | POA: Diagnosis not present

## 2022-01-07 DIAGNOSIS — R197 Diarrhea, unspecified: Secondary | ICD-10-CM | POA: Diagnosis not present

## 2022-01-07 DIAGNOSIS — N2581 Secondary hyperparathyroidism of renal origin: Secondary | ICD-10-CM | POA: Diagnosis not present

## 2022-01-07 DIAGNOSIS — L299 Pruritus, unspecified: Secondary | ICD-10-CM | POA: Diagnosis not present

## 2022-01-07 DIAGNOSIS — N186 End stage renal disease: Secondary | ICD-10-CM | POA: Diagnosis not present

## 2022-01-07 DIAGNOSIS — Z992 Dependence on renal dialysis: Secondary | ICD-10-CM | POA: Diagnosis not present

## 2022-01-09 ENCOUNTER — Emergency Department (HOSPITAL_COMMUNITY)
Admission: EM | Admit: 2022-01-09 | Discharge: 2022-01-09 | Disposition: A | Discharge status: Home / Self Care | Payer: Medicare Other | Attending: Emergency Medicine | Admitting: Emergency Medicine

## 2022-01-09 ENCOUNTER — Encounter (HOSPITAL_COMMUNITY): Payer: Self-pay | Admitting: *Deleted

## 2022-01-09 ENCOUNTER — Other Ambulatory Visit: Payer: Self-pay

## 2022-01-09 ENCOUNTER — Emergency Department (HOSPITAL_COMMUNITY): Payer: Medicare Other

## 2022-01-09 DIAGNOSIS — R0602 Shortness of breath: Secondary | ICD-10-CM | POA: Diagnosis not present

## 2022-01-09 DIAGNOSIS — F419 Anxiety disorder, unspecified: Secondary | ICD-10-CM | POA: Insufficient documentation

## 2022-01-09 DIAGNOSIS — I120 Hypertensive chronic kidney disease with stage 5 chronic kidney disease or end stage renal disease: Secondary | ICD-10-CM | POA: Diagnosis not present

## 2022-01-09 DIAGNOSIS — Z992 Dependence on renal dialysis: Secondary | ICD-10-CM | POA: Diagnosis not present

## 2022-01-09 DIAGNOSIS — N2581 Secondary hyperparathyroidism of renal origin: Secondary | ICD-10-CM | POA: Diagnosis not present

## 2022-01-09 DIAGNOSIS — R52 Pain, unspecified: Secondary | ICD-10-CM | POA: Diagnosis not present

## 2022-01-09 DIAGNOSIS — L299 Pruritus, unspecified: Secondary | ICD-10-CM | POA: Diagnosis not present

## 2022-01-09 DIAGNOSIS — R0601 Orthopnea: Secondary | ICD-10-CM | POA: Insufficient documentation

## 2022-01-09 DIAGNOSIS — I959 Hypotension, unspecified: Secondary | ICD-10-CM | POA: Diagnosis not present

## 2022-01-09 DIAGNOSIS — D631 Anemia in chronic kidney disease: Secondary | ICD-10-CM | POA: Diagnosis not present

## 2022-01-09 DIAGNOSIS — Z79899 Other long term (current) drug therapy: Secondary | ICD-10-CM | POA: Diagnosis not present

## 2022-01-09 DIAGNOSIS — I517 Cardiomegaly: Secondary | ICD-10-CM | POA: Diagnosis not present

## 2022-01-09 DIAGNOSIS — R197 Diarrhea, unspecified: Secondary | ICD-10-CM | POA: Diagnosis not present

## 2022-01-09 DIAGNOSIS — N186 End stage renal disease: Secondary | ICD-10-CM | POA: Insufficient documentation

## 2022-01-09 LAB — BASIC METABOLIC PANEL
Anion gap: 13 (ref 5–15)
BUN: 55 mg/dL — ABNORMAL HIGH (ref 8–23)
CO2: 29 mmol/L (ref 22–32)
Calcium: 8.7 mg/dL — ABNORMAL LOW (ref 8.9–10.3)
Chloride: 97 mmol/L — ABNORMAL LOW (ref 98–111)
Creatinine, Ser: 5.24 mg/dL — ABNORMAL HIGH (ref 0.44–1.00)
GFR, Estimated: 8 mL/min — ABNORMAL LOW (ref >60)
Glucose, Bld: 109 mg/dL — ABNORMAL HIGH (ref 70–99)
Potassium: 4.2 mmol/L (ref 3.5–5.1)
Sodium: 139 mmol/L (ref 135–145)

## 2022-01-09 LAB — CBC
HCT: 35.4 % — ABNORMAL LOW (ref 36.0–46.0)
Hemoglobin: 11.2 g/dL — ABNORMAL LOW (ref 12.0–15.0)
MCH: 29.7 pg (ref 26.0–34.0)
MCHC: 31.6 g/dL (ref 30.0–36.0)
MCV: 93.9 fL (ref 80.0–100.0)
Platelets: 179 10*3/uL (ref 150–400)
RBC: 3.77 MIL/uL — ABNORMAL LOW (ref 3.87–5.11)
RDW: 18.1 % — ABNORMAL HIGH (ref 11.5–15.5)
WBC: 4.7 10*3/uL (ref 4.0–10.5)
nRBC: 0 % (ref 0.0–0.2)

## 2022-01-09 NOTE — ED Triage Notes (Signed)
Pt says that she became short of breath tonight when she went to lay down. Also c/o abdominal pain, thinks it may be her diverticulitis. Last dialysis on Wednesday.

## 2022-01-09 NOTE — ED Provider Notes (Signed)
[facility] EMERGENCY DEPARTMENT Provider Note   CSN: [id number] Arrival date & time: 01/09/22  0418     History  Chief Complaint  Patient presents with   Shortness of Breath    [name] is a 71 y.o. female.  The history is provided by the patient and medical records.  Shortness of Breath [name] is a 71 y.o. female who presents to the Emergency Department complaining of sob.  She presents to the ED for evaluation of sob that started when she went to lay down to sleep.  Lives with her mother.  She reports some associated anxiety.  At time of ED assessment she reports tired.  She states she does not sleep well.    No fever.  Has occasional cough - ongoing issue.  No chest pain.  Has chronic wound to left foot (months).  Dialyzes MWF, last session Wednesday - received full session.      Home Medications Prior to Admission medications   Medication Sig Start Date End Date Taking? Authorizing Provider  acetaminophen (TYLENOL) 500 MG tablet Take 1 tablet (500 mg total) by mouth every 6 (six) hours as needed. Patient taking differently: Take 1,000 mg by mouth every 6 (six) hours as needed for mild pain. 09/05/18   Law, Bea Graff, PA-C  albuterol (VENTOLIN HFA) 108 (90 Base) MCG/ACT inhaler Inhale 1-2 puffs into the lungs every 6 (six) hours as needed for wheezing or shortness of breath. 10/26/21   Jeanell Sparrow, DO  allopurinol (ZYLOPRIM) 300 MG tablet Take 300 mg by mouth daily. 11/03/21   [provider]  ALPRAZolam Duanne Moron) 0.25 MG tablet Take 1 tablet (0.25 mg total) by mouth daily as needed for anxiety. 08/18/21   Nita Sells, MD  atorvastatin (LIPITOR) 80 MG tablet TAKE 1 TABLET (80 MG TOTAL) BY MOUTH DAILY. Patient not taking: Reported on 11/08/2021 02/25/21 02/25/22  Dessa Phi, DO  cinacalcet (SENSIPAR) 30 MG tablet Take 30 mg by mouth 3 (three) times a week. Mon, Wed, Fri-- Given @ Dialysis 01/04/20   [provider]   doxycycline (VIBRAMYCIN) 100 MG capsule Take 1 capsule (100 mg total) by mouth 2 (two) times daily. 11/28/21   Quintella Reichert, MD  folic acid-vitamin b complex-vitamin c-selenium-zinc (DIALYVITE) 3 MG TABS tablet Take 1 tablet by mouth daily.    [provider]  losartan (COZAAR) 50 MG tablet Take 50 mg by mouth daily. 11/03/21   [provider]  ondansetron (ZOFRAN) 8 MG tablet Take 8 mg by mouth 2 (two) times daily as needed for nausea or vomiting.     [provider]  pantoprazole (PROTONIX) 40 MG tablet Take 1 tablet (40 mg total) by mouth daily. 08/18/21   Nita Sells, MD  zolpidem (AMBIEN) 10 MG tablet Take 10 mg by mouth at bedtime as needed for sleep. 02/13/21   [provider]      Allergies    Sulfa antibiotics and Adhesive [tape]    Review of Systems   Review of Systems  Respiratory:  Positive for shortness of breath.   All other systems reviewed and are negative.  Physical Exam Updated Vital Signs BP (!) 141/81 (BP Location: Left Leg)    Pulse 84    Temp (!) 97.5 F (36.4 C) (Oral)    Resp (!) 22    SpO2 95%  Physical Exam Vitals and nursing note reviewed.  Constitutional:      Appearance: She is well-developed.  HENT:  Head: Normocephalic and atraumatic.  Cardiovascular:     Rate and Rhythm: Normal rate and regular rhythm.     Heart sounds: No murmur heard. Pulmonary:     Effort: Pulmonary effort is normal. No respiratory distress.     Breath sounds: Normal breath sounds.  Abdominal:     Palpations: Abdomen is soft.     Tenderness: There is no abdominal tenderness. There is no guarding or rebound.  Musculoskeletal:        General: No swelling or tenderness.     Comments: 1+ DP pulses bilaterally. Trace edema to BLE.  Chronic venous stasis changes to bilateral feet.  There is a chronic appearing wound to the lateral aspect of the left dorsal foot, approx 1.5 cm in diameter.    Fistula in the RUE with palpable thrill.     Skin:    General: Skin is warm and dry.  Neurological:     Mental Status: She is alert and oriented to person, place, and time.  Psychiatric:        Behavior: Behavior normal.    ED Results / Procedures / Treatments   Labs (all labs ordered are listed, but only abnormal results are displayed) Labs Reviewed  CBC - Abnormal; Notable for the following components:      Result Value   RBC 3.77 (*)    Hemoglobin 11.2 (*)    HCT 35.4 (*)    RDW 18.1 (*)    All other components within normal limits  BASIC METABOLIC PANEL - Abnormal; Notable for the following components:   Chloride 97 (*)    Glucose, Bld 109 (*)    BUN 55 (*)    Creatinine, Ser 5.24 (*)    Calcium 8.7 (*)    GFR, Estimated 8 (*)    All other components within normal limits    EKG EKG Interpretation  Date/Time:  Friday January 09 2022 04:20:13 EST Ventricular Rate:  87 PR Interval:  180 QRS Duration: 80 QT Interval:  414 QTC Calculation: 498 R Axis:   88 Text Interpretation: Normal sinus rhythm Nonspecific ST abnormality Prolonged QT Abnormal ECG When compared with ECG of 28-Dec-2021 07:22, No significant change since last tracing Confirmed by Quintella Reichert [phone]) on 01/09/2022 5:12:36 AM  Radiology DG Chest 2 View  Result Date: 01/09/2022 CLINICAL DATA:  Shortness of breath EXAM: CHEST - 2 VIEW COMPARISON:  12/28/2021 FINDINGS: Cardiomegaly and aortic tortuosity with diffuse aortic calcification. Congested appearance of vessels diffusely. No visible effusion. Lung volumes are chronically large. Prominent calcification at the aortic annulus and throughout the arch and descending segment. IMPRESSION: Cardiomegaly and vascular congestion. Electronically Signed   By: Jorje Guild M.D.   On: 01/09/2022 05:27    Procedures Procedures    Medications Ordered in ED Medications - No data to display  ED Course/ Medical Decision Making/ A&P                           Medical Decision Making Amount and/or  Complexity of Data Reviewed Labs: ordered. Radiology: ordered.   Patient with history of ESRD on dialysis, hypertension here for evaluation of shortness of breath when she lays down, she thinks it may be due to having a stuffy room or anxiety.  She is euvolemic on examination.  She is comfortable with no respiratory distress and good air movement bilaterally.  She is able to lay supine in the stretcher without difficulty.  Chest x-ray personally  reviewed, demonstrates cardiomegaly and vascular congestion, appears improved when compared to prior.  BMP without significant electrolyte abnormality, stable renal function.  CBC with stable anemia.  Current clinical picture is not consistent with volume overload, PE, pneumonia, acute CHF.  She is scheduled for dialysis later today and states that she plans to go there.  Feel she is stable for discharge home to dialysis later today.       Final Clinical Impression(s) / ED Diagnoses Final diagnoses:  Orthopnea  ESRD on dialysis [facility])    Rx / DC Orders ED Discharge Orders     None         Quintella Reichert, MD 01/09/22 [phone]

## 2022-01-09 NOTE — ED Triage Notes (Signed)
Pt arrives via [facility] from home. Per report, she c/o Shortness of breath when she lays down. Last Dialysis Wednesday. 96% RA, 117/93, hr 84. Denies fever.

## 2022-01-09 NOTE — ED Notes (Signed)
Patient given discharge instructions. Questions were answered. Patient verbalized understanding of discharge instructions and care at home.  Patient family called for transportation and updated regarding discharge.

## 2022-01-09 NOTE — Discharge Instructions (Addendum)
Go to your dialysis session today.  Please follow up with your family doctor in the next week - call for appointment.  Get rechecked sooner if you have new or concerning symptoms.

## 2022-01-10 ENCOUNTER — Emergency Department (HOSPITAL_COMMUNITY): Payer: Medicare Other

## 2022-01-10 ENCOUNTER — Other Ambulatory Visit: Payer: Self-pay

## 2022-01-10 ENCOUNTER — Emergency Department (HOSPITAL_COMMUNITY)
Admission: EM | Admit: 2022-01-10 | Discharge: 2022-01-10 | Disposition: A | Discharge status: Home / Self Care | Payer: Medicare Other | Attending: Emergency Medicine | Admitting: Emergency Medicine

## 2022-01-10 DIAGNOSIS — Z79899 Other long term (current) drug therapy: Secondary | ICD-10-CM | POA: Insufficient documentation

## 2022-01-10 DIAGNOSIS — R0682 Tachypnea, not elsewhere classified: Secondary | ICD-10-CM | POA: Diagnosis not present

## 2022-01-10 DIAGNOSIS — L03116 Cellulitis of left lower limb: Secondary | ICD-10-CM | POA: Diagnosis not present

## 2022-01-10 DIAGNOSIS — L03119 Cellulitis of unspecified part of limb: Secondary | ICD-10-CM

## 2022-01-10 DIAGNOSIS — I10 Essential (primary) hypertension: Secondary | ICD-10-CM | POA: Diagnosis not present

## 2022-01-10 DIAGNOSIS — S91302A Unspecified open wound, left foot, initial encounter: Secondary | ICD-10-CM | POA: Diagnosis not present

## 2022-01-10 DIAGNOSIS — I959 Hypotension, unspecified: Secondary | ICD-10-CM | POA: Diagnosis not present

## 2022-01-10 DIAGNOSIS — M79672 Pain in left foot: Secondary | ICD-10-CM | POA: Diagnosis not present

## 2022-01-10 DIAGNOSIS — R0602 Shortness of breath: Secondary | ICD-10-CM | POA: Diagnosis not present

## 2022-01-10 MED ORDER — CEPHALEXIN 500 MG PO CAPS: 500.0000 mg | ORAL_CAPSULE | Freq: Two times a day (BID) | ORAL | 0 refills | Status: AC

## 2022-01-10 NOTE — ED Notes (Signed)
Patient transported to X-ray 

## 2022-01-10 NOTE — Discharge Instructions (Addendum)
Take antibiotics as prescribed and complete the full course.  Recheck with your doctor.  Return to ER for worsening or concerning symptoms.

## 2022-01-10 NOTE — ED Triage Notes (Signed)
C/O shortness of breathe and left foot pain. Patient endorsed HD yesterday.

## 2022-01-10 NOTE — ED Provider Notes (Signed)
[facility] EMERGENCY DEPARTMENT Provider Note   CSN: [id number] Arrival date & time: 01/10/22  1607     History  Chief Complaint  Patient presents with   Shortness of Breath    [name] is a 71 y.o. female.  71 year old female brought in by EMS with complaint of shortness of breath and pain in her left foot.  Patient states that she attended dialysis yesterday, did complete a full session.  Today she was taking a bath and got short of breath.  Symptoms have since resolved.  Patient seen in this ER yesterday for same.  In regards to her left foot pain, states that this has been ongoing, had a boil on her foot a few years ago and has now had pain in this foot for the past month.  Denies injury to the foot, has not been seen by her provider for this problem. States she has a history of gout, wasn't sure if contributing to her pain today.       Home Medications Prior to Admission medications   Medication Sig Start Date End Date Taking? Authorizing Provider  cephALEXin (KEFLEX) 500 MG capsule Take 1 capsule (500 mg total) by mouth 2 (two) times daily for 7 days. 01/10/22 01/17/22 Yes Tacy Learn, PA-C  acetaminophen (TYLENOL) 500 MG tablet Take 1 tablet (500 mg total) by mouth every 6 (six) hours as needed. Patient taking differently: Take 1,000 mg by mouth every 6 (six) hours as needed for mild pain. 09/05/18   Law, Bea Graff, PA-C  albuterol (VENTOLIN HFA) 108 (90 Base) MCG/ACT inhaler Inhale 1-2 puffs into the lungs every 6 (six) hours as needed for wheezing or shortness of breath. 10/26/21   Jeanell Sparrow, DO  allopurinol (ZYLOPRIM) 300 MG tablet Take 300 mg by mouth daily. 11/03/21   [provider]  ALPRAZolam Duanne Moron) 0.25 MG tablet Take 1 tablet (0.25 mg total) by mouth daily as needed for anxiety. 08/18/21   Nita Sells, MD  atorvastatin (LIPITOR) 80 MG tablet TAKE 1 TABLET (80 MG TOTAL) BY MOUTH DAILY. Patient not taking: Reported on  11/08/2021 02/25/21 02/25/22  Dessa Phi, DO  cinacalcet (SENSIPAR) 30 MG tablet Take 30 mg by mouth 3 (three) times a week. Mon, Wed, Fri-- Given @ Dialysis 01/04/20   [provider]  doxycycline (VIBRAMYCIN) 100 MG capsule Take 1 capsule (100 mg total) by mouth 2 (two) times daily. 11/28/21   Quintella Reichert, MD  folic acid-vitamin b complex-vitamin c-selenium-zinc (DIALYVITE) 3 MG TABS tablet Take 1 tablet by mouth daily.    [provider]  losartan (COZAAR) 50 MG tablet Take 50 mg by mouth daily. 11/03/21   [provider]  ondansetron (ZOFRAN) 8 MG tablet Take 8 mg by mouth 2 (two) times daily as needed for nausea or vomiting.     [provider]  pantoprazole (PROTONIX) 40 MG tablet Take 1 tablet (40 mg total) by mouth daily. 08/18/21   Nita Sells, MD  zolpidem (AMBIEN) 10 MG tablet Take 10 mg by mouth at bedtime as needed for sleep. 02/13/21   [provider]      Allergies    Sulfa antibiotics and Adhesive [tape]    Review of Systems   Review of Systems  Constitutional:  Negative for fever.  Respiratory:  Positive for shortness of breath.   Cardiovascular:  Negative for chest pain.  Musculoskeletal:  Positive for arthralgias.  Skin:  Positive for color change and wound.  Allergic/Immunologic: Positive for immunocompromised state.  Neurological:  Negative for weakness and numbness.   Physical Exam Updated Vital Signs BP (!) 153/86    Pulse 96    Temp (!) 97.5 F (36.4 C) (Oral)    Resp (!) 26    Ht 5\' 2"  (1.575 m)    Wt 55 kg    SpO2 99%    BMI 22.18 kg/m  Physical Exam Vitals and nursing note reviewed.  Constitutional:      General: She is not in acute distress.    Appearance: She is well-developed. She is not diaphoretic.  HENT:     Head: Normocephalic and atraumatic.  Cardiovascular:     Rate and Rhythm: Normal rate and regular rhythm.  Pulmonary:     Effort: Pulmonary effort is normal.     Breath sounds: Normal  breath sounds. No decreased breath sounds.  Musculoskeletal:     Right lower leg: No tenderness. No edema.     Left lower leg: No tenderness. No edema.     Comments: Redness to bilateral feet without warmth. Sensation intact. Left foot generally tender. Wound to dorsum of left foot, scab removed and photos obtained, no drainage  Skin:    General: Skin is warm and dry.  Neurological:     Mental Status: She is alert and oriented to person, place, and time.  Psychiatric:        Behavior: Behavior normal.           ED Results / Procedures / Treatments   Labs (all labs ordered are listed, but only abnormal results are displayed) Labs Reviewed - No data to display  EKG EKG Interpretation  Date/Time:  Saturday January 10 2022 10:16:16 EST Ventricular Rate:  76 PR Interval:  188 QRS Duration: 100 QT Interval:  491 QTC Calculation: 553 R Axis:   97 Text Interpretation: Sinus rhythm Right axis deviation Consider left ventricular hypertrophy Nonspecific T abnrm, anterolateral leads NO SIGNIFICANT CHANGE SINCE LAST TRACING YESTERDAY Confirmed by Lennice Sites [phone]) on 01/10/2022 10:18:54 AM  Radiology DG Chest 2 View  Result Date: 01/09/2022 CLINICAL DATA:  Shortness of breath EXAM: CHEST - 2 VIEW COMPARISON:  12/28/2021 FINDINGS: Cardiomegaly and aortic tortuosity with diffuse aortic calcification. Congested appearance of vessels diffusely. No visible effusion. Lung volumes are chronically large. Prominent calcification at the aortic annulus and throughout the arch and descending segment. IMPRESSION: Cardiomegaly and vascular congestion. Electronically Signed   By: Jorje Guild M.D.   On: 01/09/2022 05:27   DG Foot Complete Left  Result Date: 01/10/2022 CLINICAL DATA:  71 year old female with history of left foot pain. Chronic open wound anterior to the mid metatarsals. EXAM: LEFT FOOT - COMPLETE 3+ VIEW COMPARISON:  Left foot radiograph 11/27/2021. FINDINGS: There is mild  irregularity of the soft tissues of the dorsal aspect of the foot overlying the mid and proximal metatarsals. No destructive osseous lesions are noted in the underlying bones. Old healing fracture of the distal third of the third proximal phalanx. No acute displaced fracture, subluxation or dislocation is noted. Severe joint space narrowing, subchondral sclerosis, subchondral cyst formation and osteophyte formation are noted in the first MTP joint. IMPRESSION: 1. No destructive osseous lesions to suggest osteomyelitis. 2. Chronic degenerative and posttraumatic changes, as above. No new acute osseous findings are noted. Electronically Signed   By: Vinnie Langton M.D.   On: 01/10/2022 11:08    Procedures Procedures    Medications Ordered in ED Medications - No data to display  ED Course/ Medical Decision Making/ A&P                           Medical Decision Making Amount and/or Complexity of Data Reviewed Radiology: ordered.  Risk Prescription drug management.   71 year old female with complaint of shortness of breath which has since resolved, is a reoccurring problem for this dialysis patient who did complete a full dialysis session yesterday after her ER work-up yesterday morning.  She also notes that she has had a wound to her left foot that has been an ongoing problem with pain for the past month now.  On exam is found to have mild redness to the foot, not particularly warm to the touch.  X-rays negative for osteomyelitis or acute bony changes.  Plan is to treat with Keflex and have patient recheck with her PCP.        Final Clinical Impression(s) / ED Diagnoses Final diagnoses:  Left foot pain  Cellulitis of foot    Rx / DC Orders ED Discharge Orders          Ordered    cephALEXin (KEFLEX) 500 MG capsule  2 times daily        01/10/22 1125              Tacy Learn, PA-C 01/10/22 1222    Curatolo, Adam, DO 01/10/22 1500

## 2022-01-11 ENCOUNTER — Other Ambulatory Visit: Payer: Self-pay

## 2022-01-11 ENCOUNTER — Emergency Department (HOSPITAL_COMMUNITY): Payer: Medicare Other

## 2022-01-11 ENCOUNTER — Encounter (HOSPITAL_COMMUNITY): Payer: Self-pay

## 2022-01-11 ENCOUNTER — Emergency Department (HOSPITAL_COMMUNITY)
Admission: EM | Admit: 2022-01-11 | Discharge: 2022-01-11 | Disposition: A | Discharge status: Home / Self Care | Payer: Medicare Other | Attending: Emergency Medicine | Admitting: Emergency Medicine

## 2022-01-11 DIAGNOSIS — M79673 Pain in unspecified foot: Secondary | ICD-10-CM | POA: Diagnosis not present

## 2022-01-11 DIAGNOSIS — Z79899 Other long term (current) drug therapy: Secondary | ICD-10-CM | POA: Diagnosis not present

## 2022-01-11 DIAGNOSIS — J439 Emphysema, unspecified: Secondary | ICD-10-CM | POA: Diagnosis not present

## 2022-01-11 DIAGNOSIS — R0602 Shortness of breath: Secondary | ICD-10-CM | POA: Insufficient documentation

## 2022-01-11 DIAGNOSIS — I10 Essential (primary) hypertension: Secondary | ICD-10-CM | POA: Diagnosis not present

## 2022-01-11 DIAGNOSIS — R0989 Other specified symptoms and signs involving the circulatory and respiratory systems: Secondary | ICD-10-CM | POA: Diagnosis not present

## 2022-01-11 MED ORDER — IPRATROPIUM-ALBUTEROL 0.5-2.5 (3) MG/3ML IN SOLN
3.0000 mL | Freq: Once | RESPIRATORY_TRACT | Status: AC
  Administered 2022-01-11: 3 mL via RESPIRATORY_TRACT
  Filled 2022-01-11: qty 3

## 2022-01-11 MED ORDER — ALBUTEROL SULFATE HFA 108 (90 BASE) MCG/ACT IN AERS
1.0000 | INHALATION_SPRAY | Freq: Four times a day (QID) | RESPIRATORY_TRACT | 0 refills | Status: DC | PRN
Start: 2022-01-11 — End: 2023-03-04

## 2022-01-11 NOTE — ED Provider Notes (Signed)
[facility] EMERGENCY DEPARTMENT Provider Note   CSN: [id number] Arrival date & time: 01/11/22  0256     History  Chief Complaint  Patient presents with   Shortness of Breath   Foot Pain    [name] is a 71 y.o. female.  71 year old female with a past medical history of anxiety, possibly asthma and on dialysis that presents emerged from today with recurrent shortness of breath.  She states that when she wakes up in [employer] she feels short of breath so she comes emergency room.  Her vital signs were normal with EMS.  She feels better at this time.  She also states that her foot still hurts even though she has not started antibiotics from since 24 hours ago.  No fevers.   Shortness of Breath Foot Pain Associated symptoms include shortness of breath.      Home Medications Prior to Admission medications   Medication Sig Start Date End Date Taking? Authorizing Provider  albuterol (VENTOLIN HFA) 108 (90 Base) MCG/ACT inhaler Inhale 1-2 puffs into the lungs every 6 (six) hours as needed for wheezing or shortness of breath. 01/11/22  Yes Ahmaad Neidhardt, Corene Cornea, MD  acetaminophen (TYLENOL) 500 MG tablet Take 1 tablet (500 mg total) by mouth every 6 (six) hours as needed. Patient taking differently: Take 1,000 mg by mouth every 6 (six) hours as needed for mild pain. 09/05/18   Law, Bea Graff, PA-C  allopurinol (ZYLOPRIM) 300 MG tablet Take 300 mg by mouth daily. 11/03/21   [provider]  ALPRAZolam Duanne Moron) 0.25 MG tablet Take 1 tablet (0.25 mg total) by mouth daily as needed for anxiety. 08/18/21   Nita Sells, MD  cephALEXin (KEFLEX) 500 MG capsule Take 1 capsule (500 mg total) by mouth 2 (two) times daily for 7 days. 01/10/22 01/17/22  Tacy Learn, PA-C  cinacalcet (SENSIPAR) 30 MG tablet Take 30 mg by mouth 3 (three) times a week. Mon, Wed, Fri-- Given @ Dialysis 01/04/20   [provider]  doxycycline (VIBRAMYCIN) 100 MG capsule Take  1 capsule (100 mg total) by mouth 2 (two) times daily. 11/28/21   Quintella Reichert, MD  folic acid-vitamin b complex-vitamin c-selenium-zinc (DIALYVITE) 3 MG TABS tablet Take 1 tablet by mouth daily.    [provider]  losartan (COZAAR) 50 MG tablet Take 50 mg by mouth daily. 11/03/21   [provider]  ondansetron (ZOFRAN) 8 MG tablet Take 8 mg by mouth 2 (two) times daily as needed for nausea or vomiting.     [provider]  pantoprazole (PROTONIX) 40 MG tablet Take 1 tablet (40 mg total) by mouth daily. 08/18/21   Nita Sells, MD  zolpidem (AMBIEN) 10 MG tablet Take 10 mg by mouth at bedtime as needed for sleep. 02/13/21   [provider]      Allergies    Sulfa antibiotics and Adhesive [tape]    Review of Systems   Review of Systems  Respiratory:  Positive for shortness of breath.    Physical Exam Updated Vital Signs BP (!) 144/65    Pulse 88    Temp 98.2 F (36.8 C) (Oral)    Resp 16    Ht 5\' 2"  (1.575 m)    Wt 55 kg    SpO2 95%    BMI 22.18 kg/m  Physical Exam Vitals and nursing note reviewed.  Constitutional:      Appearance: She is well-developed.  HENT:     Head: Normocephalic  and atraumatic.  Cardiovascular:     Rate and Rhythm: Normal rate and regular rhythm.  Pulmonary:     Effort: Pulmonary effort is normal. No respiratory distress.     Breath sounds: No stridor.  Chest:     Chest wall: No mass or tenderness.  Abdominal:     General: There is no distension.     Palpations: Abdomen is soft.  Musculoskeletal:        General: Normal range of motion.     Cervical back: Normal range of motion.  Skin:    General: Skin is warm and dry.  Neurological:     General: No focal deficit present.     Mental Status: She is alert.    ED Results / Procedures / Treatments   Labs (all labs ordered are listed, but only abnormal results are displayed) Labs Reviewed - No data to display  EKG EKG Interpretation  Date/Time:  Sunday  January 11 2022 04:26:21 EST Ventricular Rate:  79 PR Interval:  191 QRS Duration: 100 QT Interval:  446 QTC Calculation: 512 R Axis:   81 Text Interpretation: Sinus rhythm Borderline right axis deviation Prolonged QT interval Confirmed by Merrily Pew [phone]) on 01/11/2022 4:46:45 AM  Radiology DG Chest Portable 1 View  Result Date: 01/11/2022 CLINICAL DATA:  Shortness of breath. EXAM: PORTABLE CHEST 1 VIEW COMPARISON:  Chest PA Lat recently 01/09/2022. FINDINGS: The heart is moderately enlarged. The aorta is tortuous and calcified with aortic ectasia, stable mediastinum allowing for patient rotation to the right. There are chronically prominent perihilar vessels. No acute edema is seen. No pleural effusion is evident. There are chronic interstitial changes in the upper lung fields, chronic pulmonary emphysema without focal infiltrate. Osteopenia. IMPRESSION: Cardiomegaly with chronic perihilar vascular congestion. No acute edema findings. COPD and chronic change. Aortic atherosclerosis. Stable chest. Electronically Signed   By: Telford Nab M.D.   On: 01/11/2022 04:41   DG Foot Complete Left  Result Date: 01/10/2022 CLINICAL DATA:  71 year old female with history of left foot pain. Chronic open wound anterior to the mid metatarsals. EXAM: LEFT FOOT - COMPLETE 3+ VIEW COMPARISON:  Left foot radiograph 11/27/2021. FINDINGS: There is mild irregularity of the soft tissues of the dorsal aspect of the foot overlying the mid and proximal metatarsals. No destructive osseous lesions are noted in the underlying bones. Old healing fracture of the distal third of the third proximal phalanx. No acute displaced fracture, subluxation or dislocation is noted. Severe joint space narrowing, subchondral sclerosis, subchondral cyst formation and osteophyte formation are noted in the first MTP joint. IMPRESSION: 1. No destructive osseous lesions to suggest osteomyelitis. 2. Chronic degenerative and posttraumatic  changes, as above. No new acute osseous findings are noted. Electronically Signed   By: Vinnie Langton M.D.   On: 01/10/2022 11:08    Procedures Procedures    Medications Ordered in ED Medications  ipratropium-albuterol (DUONEB) 0.5-2.5 (3) MG/3ML nebulizer solution 3 mL (3 mLs Nebulization Given 01/11/22 0425)    ED Course/ Medical Decision Making/ A&P                           Medical Decision Making Amount and/or Complexity of Data Reviewed Radiology: ordered. ECG/medicine tests: ordered.  Risk Prescription drug management.   Patient is certainly at high risk for significant abnormality however she has clear lungs no wheezing or rales.  She has no lower extremity edema.  She has no fever.  She  has no ox requirement she has no tachypnea she has no cough at this time.  She could be having anxiety versus sleep apnea versus a multitude of other possibilities however due to the multiple negative work-ups over the last few months I do not feel the need to look into it so extensively when she appears normal at this time.  Per her request we will refill her albuterol.  Otherwise chest x-ray and EKG just to ensure there is no significant change otherwise will likely be discharged. Ginger ale provided.   Final Clinical Impression(s) / ED Diagnoses Final diagnoses:  Shortness of breath    Rx / DC Orders ED Discharge Orders          Ordered    albuterol (VENTOLIN HFA) 108 (90 Base) MCG/ACT inhaler  Every 6 hours PRN        01/11/22 0323              Amberleigh Gerken, Corene Cornea, MD 01/11/22 [phone]

## 2022-01-11 NOTE — ED Triage Notes (Signed)
Pt presents to the ED from home via [facility] with complaints of SOB, left foot pain. Pt states she has SOB a lot in the mornings, she is out of her inhalers. Pt on RA at home. Pt has ulcer to left foot, wants a creme for it.

## 2022-01-12 DIAGNOSIS — D631 Anemia in chronic kidney disease: Secondary | ICD-10-CM | POA: Diagnosis not present

## 2022-01-12 DIAGNOSIS — N2581 Secondary hyperparathyroidism of renal origin: Secondary | ICD-10-CM | POA: Diagnosis not present

## 2022-01-12 DIAGNOSIS — R197 Diarrhea, unspecified: Secondary | ICD-10-CM | POA: Diagnosis not present

## 2022-01-12 DIAGNOSIS — R52 Pain, unspecified: Secondary | ICD-10-CM | POA: Diagnosis not present

## 2022-01-12 DIAGNOSIS — N186 End stage renal disease: Secondary | ICD-10-CM | POA: Diagnosis not present

## 2022-01-12 DIAGNOSIS — Z992 Dependence on renal dialysis: Secondary | ICD-10-CM | POA: Diagnosis not present

## 2022-01-12 DIAGNOSIS — L299 Pruritus, unspecified: Secondary | ICD-10-CM | POA: Diagnosis not present

## 2022-01-13 ENCOUNTER — Ambulatory Visit: Payer: Medicare Other | Admitting: Cardiovascular Disease

## 2022-01-13 ENCOUNTER — Other Ambulatory Visit: Payer: Self-pay

## 2022-01-13 ENCOUNTER — Encounter: Payer: Self-pay | Admitting: Cardiovascular Disease

## 2022-01-13 VITALS — BP 118/64 | HR 88 | Ht 62.0 in | Wt 109.4 lb

## 2022-01-13 DIAGNOSIS — I2510 Atherosclerotic heart disease of native coronary artery without angina pectoris: Secondary | ICD-10-CM

## 2022-01-13 DIAGNOSIS — N186 End stage renal disease: Secondary | ICD-10-CM | POA: Diagnosis not present

## 2022-01-13 DIAGNOSIS — T8612 Kidney transplant failure: Secondary | ICD-10-CM | POA: Diagnosis not present

## 2022-01-13 DIAGNOSIS — Z992 Dependence on renal dialysis: Secondary | ICD-10-CM | POA: Diagnosis not present

## 2022-01-13 DIAGNOSIS — I10 Essential (primary) hypertension: Secondary | ICD-10-CM | POA: Diagnosis not present

## 2022-01-13 MED ORDER — ATORVASTATIN CALCIUM 40 MG PO TABS: 40.0000 mg | ORAL_TABLET | Freq: Every day | ORAL | 3 refills | Status: DC

## 2022-01-13 NOTE — Patient Instructions (Signed)
Medication Instructions:  1) Start Aspirin 81 mg daily   2) Start Atorvastatin 40 mg daily   *If you need a refill on your cardiac medications before your next appointment, please call your pharmacy*   Lab Work: None ordered   If you have labs (blood work) drawn today and your tests are completely normal, you will receive your results only by: [place] (if you have MyChart) OR A paper copy in the mail If you have any lab test that is abnormal or we need to change your treatment, we will call you to review the results.   Testing/Procedures: None ordered    Follow-Up: At [facility], you and your health needs are our priority.  As part of our continuing mission to provide you with exceptional heart care, we have created designated Provider Care Teams.  These Care Teams include your primary Cardiologist (physician) and Advanced Practice Providers (APPs -  Physician Assistants and Nurse Practitioners) who all work together to provide you with the care you need, when you need it.  We recommend signing up for the patient portal called "MyChart".  Sign up information is provided on this After Visit Summary.  MyChart is used to connect with patients for Virtual Visits (Telemedicine).  Patients are able to view lab/test results, encounter notes, upcoming appointments, etc.  Non-urgent messages can be sent to your provider as well.   To learn more about what you can do with MyChart, go to [email or web address].    Your next appointment:   12 month(s)  The format for your next appointment:   In Person  Provider:   Lauree Chandler, MD     Other Instructions None

## 2022-01-13 NOTE — Progress Notes (Signed)
Chief Complaint  Patient presents with   Follow-up    CAD   History of Present Illness: 71 yo female with history of CAD (seen on chest CT, no prior cath), HTN, HLD, TIA, ESRD on HD, chronic diastolic CHF and anemia here today for cardiac follow up. She was hospitalized in April 2021 for placement of a RUE fistula, though post-surgical course was complicated by shock/hypotension and hyperkalemia resulting in sustained VT for which she was cardioverted to sinus. Echo at that time showed a new wall motion abnormality which was felt to be a possible stress cardiomyopathy in the setting of hypotension and recent surgery. She was subsequently admitted later that month with chest pain and SOB. Troponins were mildly elevated at that time to the 50s. Nuclear stress test March 2021 with no ischemia. Echo April 2021 with LVEF=50-55%, G1DD, distal anteroseptal and apical wall motion abnormality, moderate to severe TR. She was admitted to [facility] June 2021 with chest pain. Troponin elevated to 2900 but her chest pain resolved with fluid removal. She refused to consider a cardiac cath or cardiac CTA. Possible TIA in March 2022. Head MRI negative for stroke. Echo March 2022 with LVEF=60-65% with apical hypokinesis, no LV thrombus with contrast, mild MR.   She is here today for follow up. The patient denies any chest pain, dyspnea, palpitations, lower extremity edema, orthopnea, PND, dizziness, near syncope or syncope. She has daily dyspnea which seems to improve after dialysis.   Primary Care Physician: [name], MD  Past Medical History:  Diagnosis Date   Adenomatous polyp of colon 10/2010, 2006, 2015   Anemia in CKD (chronic kidney disease) 11/07/2012   s/p blood transfusion.    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  Had some early rejection then stable function for years, then had slow decline of function and went back on hemodialysis in 2012.  Gets HD TTS schedule at [facility] on [facility] still using L forearm AVF.      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[place])    left upper arm   Pulmonary edema 12/2019   Stroke ([place]) 11/2015   TIA   Weight loss, unintentional     Past Surgical History:  Procedure Laterality Date   AV FISTULA PLACEMENT     for dialysis   AV FISTULA PLACEMENT Left 11/22/2015   Procedure: ARTERIOVENOUS (AV) FISTULA CREATION-LEFT BRACHIOCEPHALIC;  Surgeon: Serafina Mitchell, MD;  Location: [place];  Service: Vascular;  Laterality: Left;   AV FISTULA PLACEMENT Right 03/15/2020   Procedure: INSERTION OF ARTERIOVENOUS (AV) GORE-TEX GRAFT ARM ( BRACHIAL AXILLARY );  Surgeon: Katha Cabal, MD;  Location: [facility];  Service: Vascular;  Laterality: Right;   BACK SURGERY     CERVICAL FUSION     CHOLECYSTECTOMY  12/02/2012   Procedure: LAPAROSCOPIC CHOLECYSTECTOMY WITH  INTRAOPERATIVE CHOLANGIOGRAM;  Surgeon: Edward Jolly, MD;  Location: [place];  Service: General;  Laterality: N/A;   EYE SURGERY Bilateral    cataract surgery   EYE SURGERY Left 2019   laser   HEMATOMA EVACUATION Left 12/24/2016   Procedure: EVACUATION HEMATOMA LEFT UPPER ARM;  Surgeon: Waynetta Sandy, MD;  Location: [place];  Service: Vascular;  Laterality: Left;   I & D EXTREMITY Left 12/31/2016   Procedure: IRRIGATION AND DEBRIDEMENT EXTREMITY;  Surgeon: Angelia Mould, MD;  Location: [place];  Service: Vascular;  Laterality: Left;   INSERTION OF  DIALYSIS CATHETER Right 12/24/2016   Procedure: INSERTION OF DIALYSIS CATHETER;  Surgeon: Waynetta Sandy, MD;  Location: [place];  Service: Vascular;  Laterality: Right;   INSERTION OF DIALYSIS CATHETER Right 02/04/2017   Procedure: INSERTION OF DIALYSIS CATHETER;  Surgeon: Waynetta Sandy, MD;  Location: [place];  Service: Vascular;  Laterality: Right;   [place] Left 10/23/2016   Procedure: Fistulagram;  Surgeon: Elam Dutch, MD;  Location: [place] [facility];  Service: Cardiovascular;  Laterality: Left;   PERIPHERAL VASCULAR THROMBECTOMY Right 04/16/2020   Procedure: PERIPHERAL VASCULAR THROMBECTOMY;  Surgeon: Katha Cabal, MD;  Location: [place] [facility];  Service: Cardiovascular;  Laterality: Right;   RESECTION OF ARTERIOVENOUS FISTULA ANEURYSM Left 11/22/2015   Procedure: RESECTION OF LEFT RADIOCEPHALIC FISTULA ANEURYSM ;  Surgeon: Serafina Mitchell, MD;  Location: [place] [facility];  Service: Vascular;  Laterality: Left;   REVISON OF ARTERIOVENOUS FISTULA Left 12/22/2016   Procedure: REVISON OF LEFT ARTERIOVENOUS FISTULA;  Surgeon: Waynetta Sandy, MD;  Location: [place];  Service: Vascular;  Laterality: Left;   REVISON OF ARTERIOVENOUS FISTULA Left 02/04/2017   Procedure: REVISON OF LEFT UPPER ARM ARTERIOVENOUS FISTULA;  Surgeon: Waynetta Sandy, MD;  Location: [place];  Service: Vascular;  Laterality: Left;   UPPER EXTREMITY ANGIOGRAPHY Bilateral 09/19/2019   Procedure: UPPER EXTREMITY ANGIOGRAPHY;  Surgeon: Katha Cabal, MD;  Location: [place] [facility];  Service: Cardiovascular;  Laterality: Bilateral;    Current Outpatient Medications  Medication Sig Dispense Refill   acetaminophen (TYLENOL) 500 MG tablet Take 1 tablet (500 mg total) by mouth every 6 (six) hours as needed. 30 tablet 0   albuterol (VENTOLIN HFA) 108 (90 Base) MCG/ACT inhaler Inhale 1-2 puffs into the lungs every 6 (six) hours as needed  for wheezing or shortness of breath. 1 each 0   ALPRAZolam (XANAX) 0.25 MG tablet Take 1 tablet (0.25 mg total) by mouth daily as needed for anxiety. 10 tablet 0   aspirin EC 81 MG tablet Take 81 mg by mouth daily.     atorvastatin (LIPITOR) 40 MG tablet Take 1 tablet (40 mg total) by mouth daily. 90 tablet 3   cephALEXin (KEFLEX) 500 MG capsule Take 1 capsule (500 mg total) by mouth 2 (two) times daily for 7 days. 14 capsule 0   cinacalcet (SENSIPAR) 30 MG tablet Take 30 mg by mouth 3 (three) times a week. Mon, Wed, Fri-- Given @ Dialysis     folic acid-vitamin b complex-vitamin c-selenium-zinc (DIALYVITE) 3 MG TABS tablet Take 1 tablet by mouth daily.     ondansetron (ZOFRAN) 8 MG tablet Take 8 mg by mouth 2 (two) times daily as needed for nausea or vomiting.   0   pantoprazole (PROTONIX) 40 MG tablet Take 1 tablet (40 mg total) by mouth daily. 60 tablet    allopurinol (  ZYLOPRIM) 300 MG tablet Take 300 mg by mouth daily. (Patient not taking: Reported on 01/13/2022)     doxycycline (VIBRAMYCIN) 100 MG capsule Take 1 capsule (100 mg total) by mouth 2 (two) times daily. (Patient not taking: Reported on 01/13/2022) 20 capsule 0   losartan (COZAAR) 50 MG tablet Take 50 mg by mouth daily. (Patient not taking: Reported on 01/13/2022)     zolpidem (AMBIEN) 10 MG tablet Take 10 mg by mouth at bedtime as needed for sleep. (Patient not taking: Reported on 01/13/2022)     No current facility-administered medications for this visit.    Allergies  Allergen Reactions   Sulfa Antibiotics Other (See Comments)    Per patient, both parents allergic-so will not take   Adhesive [Tape] Itching    Social History   Socioeconomic History   Marital status: Widowed    Spouse name: Not on file   Number of children: 2   Years of education: Not on file   Highest education level: Not on file  Occupational History   Not on file  Tobacco Use   Smoking status: Former    Types: Cigarettes    Quit date: 12/31/1991     Years since quitting: 30.0   Smokeless tobacco: Never  Vaping Use   Vaping Use: Never used  Substance and Sexual Activity   Alcohol use: No    Alcohol/week: 0.0 standard drinks   Drug use: No   Sexual activity: Not Currently  Other Topics Concern   Not on file  Social History Narrative   Living with her mother    Right handed   Caffeine: only decaf clears ("no brown sodas" or coffee d/t kidney disease)   Low potassium foods d/t kidney disease   Social Determinants of Health   Financial Resource Strain: Not on file  Food Insecurity: Not on file  Transportation Needs: Not on file  Physical Activity: Not on file  Stress: Not on file  Social Connections: Not on file  Intimate Partner Violence: Not on file    Family History  Problem Relation Age of Onset   Colon cancer Brother    Cancer Brother    Coronary artery disease Mother 18   Hyperlipidemia Mother    Hypertension Mother    Stroke Maternal Aunt    Esophageal cancer Neg Hx    Stomach cancer Neg Hx    Rectal cancer Neg Hx     Review of Systems:  As stated in the HPI and otherwise negative.   BP 118/64    Pulse 88    Ht 5\' 2"  (1.575 m)    Wt 109 lb 6.4 oz (49.6 kg)    SpO2 96%    BMI 20.01 kg/m   NO blood pressure reading due to inability to use arms for her BP (HD access). Unable to obtain pressure reading in her legs.   Physical Examination:  General: Well developed, well nourished, NAD  HEENT: OP clear, mucus membranes moist  SKIN: warm, dry. No rashes. Neuro: No focal deficits  Musculoskeletal: Muscle strength 5/5 all ext  Psychiatric: Mood and affect normal  Neck: No JVD, no carotid bruits, no thyromegaly, no lymphadenopathy.  Lungs:Clear bilaterally, no wheezes, rhonci, crackles Cardiovascular: Regular rate and rhythm. Soft systolic murmur.  Abdomen:Soft. Bowel sounds present. Non-tender.  Extremities: No lower extremity edema. Pulses are 2 + in the bilateral DP/PT.  EKG:  EKG is not  ordered  today. The ekg ordered today demonstrates   Echo March 2022:  1. No LV thrombus noted on this non-contrast study. Would recommend  limited contrast study given anteroapical/apex hypokinesis. Grade 2 DD is  present. Moderately elevated RVSP.   2. Left ventricular ejection fraction, by estimation, is 60 to 65%. The  left ventricle has normal function. The left ventricle demonstrates  regional wall motion abnormalities (see scoring diagram/findings for  description). Left ventricular diastolic  parameters are consistent with Grade II diastolic dysfunction  (pseudonormalization). Elevated left atrial pressure.   3. Right ventricular systolic function is normal. The right ventricular  size is moderately enlarged. There is moderately elevated pulmonary artery  systolic pressure. The estimated right ventricular systolic pressure is  37.1 mmHg.   4. Left atrial size was moderately dilated.   5. The mitral valve is degenerative. Mild mitral valve regurgitation. No  evidence of mitral stenosis.   6. Tricuspid valve regurgitation is moderate.   7. The aortic valve is tricuspid. There is mild calcification of the  aortic valve. There is mild thickening of the aortic valve. Aortic valve  regurgitation is not visualized. Mild aortic valve sclerosis is present,  with no evidence of aortic valve  stenosis.   8. The inferior vena cava is normal in size with greater than 50%  respiratory variability, suggesting right atrial pressure of 3 mmHg.   Recent Labs: 11/08/2021: Magnesium 2.2 11/18/2021: B Natriuretic Peptide >4,500.0 12/28/2021: ALT 10 01/09/2022: BUN 55; Creatinine, Ser 5.24; Hemoglobin 11.2; Platelets 179; Potassium 4.2; Sodium 139   Lipid Panel    Component Value Date/Time   CHOL 191 02/22/2021 0249   TRIG 87 02/22/2021 0249   HDL 57 02/22/2021 0249   CHOLHDL 3.4 02/22/2021 0249   VLDL 17 02/22/2021 0249   LDLCALC 117 (H) 02/22/2021 0249     Wt Readings from Last 3 Encounters:   01/13/22 109 lb 6.4 oz (49.6 kg)  01/11/22 121 lb 4.1 oz (55 kg)  01/10/22 121 lb 4.1 oz (55 kg)     Assessment and Plan:   1. CAD without angina: Evidence of coronary artery disease on chest CT in December 2020. No ischemia on stress test in March 2021. NO chest pain. Given her dyspnea, we have discussed a cardiac cath in the past but she has refused. Her probability of CAD is high. She has been off of ASA and statin. Her beta blocker was also stopped. I will restart ASA 81 mg daily and atorvastatin 40 mg po daily.     2. HTN: BP is well controlled.   Current medicines are reviewed at length with the patient today.  The patient does not have concerns regarding medicines.  The following changes have been made:  no change  Labs/ tests ordered today include:  No orders of the defined types were placed in this encounter.    Disposition:   F/U with me in 12 months   Signed, Lauree Chandler, MD 01/13/2022 11:24 AM    [facility] [address], [place], [place]  [address] Phone: [phone]; Fax: [phone]

## 2022-01-14 DIAGNOSIS — E875 Hyperkalemia: Secondary | ICD-10-CM | POA: Diagnosis not present

## 2022-01-14 DIAGNOSIS — L299 Pruritus, unspecified: Secondary | ICD-10-CM | POA: Diagnosis not present

## 2022-01-14 DIAGNOSIS — D631 Anemia in chronic kidney disease: Secondary | ICD-10-CM | POA: Diagnosis not present

## 2022-01-14 DIAGNOSIS — Z992 Dependence on renal dialysis: Secondary | ICD-10-CM | POA: Diagnosis not present

## 2022-01-14 DIAGNOSIS — N186 End stage renal disease: Secondary | ICD-10-CM | POA: Diagnosis not present

## 2022-01-14 DIAGNOSIS — N2581 Secondary hyperparathyroidism of renal origin: Secondary | ICD-10-CM | POA: Diagnosis not present

## 2022-01-14 DIAGNOSIS — R52 Pain, unspecified: Secondary | ICD-10-CM | POA: Diagnosis not present

## 2022-01-14 DIAGNOSIS — R197 Diarrhea, unspecified: Secondary | ICD-10-CM | POA: Diagnosis not present

## 2022-01-16 DIAGNOSIS — R197 Diarrhea, unspecified: Secondary | ICD-10-CM | POA: Diagnosis not present

## 2022-01-16 DIAGNOSIS — L299 Pruritus, unspecified: Secondary | ICD-10-CM | POA: Diagnosis not present

## 2022-01-16 DIAGNOSIS — E875 Hyperkalemia: Secondary | ICD-10-CM | POA: Diagnosis not present

## 2022-01-16 DIAGNOSIS — D631 Anemia in chronic kidney disease: Secondary | ICD-10-CM | POA: Diagnosis not present

## 2022-01-16 DIAGNOSIS — N2581 Secondary hyperparathyroidism of renal origin: Secondary | ICD-10-CM | POA: Diagnosis not present

## 2022-01-16 DIAGNOSIS — N186 End stage renal disease: Secondary | ICD-10-CM | POA: Diagnosis not present

## 2022-01-16 DIAGNOSIS — R52 Pain, unspecified: Secondary | ICD-10-CM | POA: Diagnosis not present

## 2022-01-16 DIAGNOSIS — Z992 Dependence on renal dialysis: Secondary | ICD-10-CM | POA: Diagnosis not present

## 2022-01-19 DIAGNOSIS — N186 End stage renal disease: Secondary | ICD-10-CM | POA: Diagnosis not present

## 2022-01-19 DIAGNOSIS — R52 Pain, unspecified: Secondary | ICD-10-CM | POA: Diagnosis not present

## 2022-01-19 DIAGNOSIS — Z992 Dependence on renal dialysis: Secondary | ICD-10-CM | POA: Diagnosis not present

## 2022-01-19 DIAGNOSIS — R197 Diarrhea, unspecified: Secondary | ICD-10-CM | POA: Diagnosis not present

## 2022-01-19 DIAGNOSIS — L299 Pruritus, unspecified: Secondary | ICD-10-CM | POA: Diagnosis not present

## 2022-01-19 DIAGNOSIS — E875 Hyperkalemia: Secondary | ICD-10-CM | POA: Diagnosis not present

## 2022-01-19 DIAGNOSIS — N2581 Secondary hyperparathyroidism of renal origin: Secondary | ICD-10-CM | POA: Diagnosis not present

## 2022-01-19 DIAGNOSIS — D631 Anemia in chronic kidney disease: Secondary | ICD-10-CM | POA: Diagnosis not present

## 2022-01-20 DIAGNOSIS — I2510 Atherosclerotic heart disease of native coronary artery without angina pectoris: Secondary | ICD-10-CM | POA: Diagnosis not present

## 2022-01-20 DIAGNOSIS — D631 Anemia in chronic kidney disease: Secondary | ICD-10-CM | POA: Diagnosis not present

## 2022-01-20 DIAGNOSIS — E7800 Pure hypercholesterolemia, unspecified: Secondary | ICD-10-CM | POA: Diagnosis not present

## 2022-01-20 DIAGNOSIS — I10 Essential (primary) hypertension: Secondary | ICD-10-CM | POA: Diagnosis not present

## 2022-01-21 ENCOUNTER — Emergency Department (HOSPITAL_COMMUNITY): Payer: Medicare Other

## 2022-01-21 ENCOUNTER — Other Ambulatory Visit: Payer: Self-pay

## 2022-01-21 ENCOUNTER — Encounter (HOSPITAL_COMMUNITY): Payer: Self-pay

## 2022-01-21 ENCOUNTER — Emergency Department (HOSPITAL_COMMUNITY)
Admission: EM | Admit: 2022-01-21 | Discharge: 2022-01-21 | Disposition: A | Discharge status: Home / Self Care | Payer: Medicare Other | Attending: Emergency Medicine | Admitting: Emergency Medicine

## 2022-01-21 DIAGNOSIS — R0602 Shortness of breath: Secondary | ICD-10-CM | POA: Diagnosis not present

## 2022-01-21 DIAGNOSIS — Z7982 Long term (current) use of aspirin: Secondary | ICD-10-CM | POA: Diagnosis not present

## 2022-01-21 DIAGNOSIS — Z992 Dependence on renal dialysis: Secondary | ICD-10-CM | POA: Diagnosis not present

## 2022-01-21 DIAGNOSIS — I517 Cardiomegaly: Secondary | ICD-10-CM | POA: Diagnosis not present

## 2022-01-21 DIAGNOSIS — R2243 Localized swelling, mass and lump, lower limb, bilateral: Secondary | ICD-10-CM | POA: Diagnosis not present

## 2022-01-21 DIAGNOSIS — N186 End stage renal disease: Secondary | ICD-10-CM | POA: Diagnosis not present

## 2022-01-21 DIAGNOSIS — R0789 Other chest pain: Secondary | ICD-10-CM | POA: Insufficient documentation

## 2022-01-21 DIAGNOSIS — I10 Essential (primary) hypertension: Secondary | ICD-10-CM | POA: Diagnosis not present

## 2022-01-21 DIAGNOSIS — R9431 Abnormal electrocardiogram [ECG] [EKG]: Secondary | ICD-10-CM | POA: Diagnosis not present

## 2022-01-21 DIAGNOSIS — I959 Hypotension, unspecified: Secondary | ICD-10-CM | POA: Diagnosis not present

## 2022-01-21 LAB — CBC WITH DIFFERENTIAL/PLATELET
Abs Immature Granulocytes: 0.02 10*3/uL (ref 0.00–0.07)
Basophils Absolute: 0 10*3/uL (ref 0.0–0.1)
Basophils Relative: 1 %
Eosinophils Absolute: 0.3 10*3/uL (ref 0.0–0.5)
Eosinophils Relative: 6 %
HCT: 33.2 % — ABNORMAL LOW (ref 36.0–46.0)
Hemoglobin: 10.9 g/dL — ABNORMAL LOW (ref 12.0–15.0)
Immature Granulocytes: 0 %
Lymphocytes Relative: 30 %
Lymphs Abs: 1.7 10*3/uL (ref 0.7–4.0)
MCH: 30.6 pg (ref 26.0–34.0)
MCHC: 32.8 g/dL (ref 30.0–36.0)
MCV: 93.3 fL (ref 80.0–100.0)
Monocytes Absolute: 0.6 10*3/uL (ref 0.1–1.0)
Monocytes Relative: 10 %
Neutro Abs: 3.1 10*3/uL (ref 1.7–7.7)
Neutrophils Relative %: 53 %
Platelets: 184 10*3/uL (ref 150–400)
RBC: 3.56 MIL/uL — ABNORMAL LOW (ref 3.87–5.11)
RDW: 18.2 % — ABNORMAL HIGH (ref 11.5–15.5)
WBC: 5.8 10*3/uL (ref 4.0–10.5)
nRBC: 0 % (ref 0.0–0.2)

## 2022-01-21 LAB — BASIC METABOLIC PANEL
Anion gap: 14 (ref 5–15)
BUN: 50 mg/dL — ABNORMAL HIGH (ref 8–23)
CO2: 31 mmol/L (ref 22–32)
Calcium: 8.8 mg/dL — ABNORMAL LOW (ref 8.9–10.3)
Chloride: 94 mmol/L — ABNORMAL LOW (ref 98–111)
Creatinine, Ser: 6 mg/dL — ABNORMAL HIGH (ref 0.44–1.00)
GFR, Estimated: 7 mL/min — ABNORMAL LOW (ref >60)
Glucose, Bld: 83 mg/dL (ref 70–99)
Potassium: 4.4 mmol/L (ref 3.5–5.1)
Sodium: 139 mmol/L (ref 135–145)

## 2022-01-21 LAB — TROPONIN I (HIGH SENSITIVITY): Troponin I (High Sensitivity): 19 ng/L — ABNORMAL HIGH (ref <18)

## 2022-01-21 NOTE — Progress Notes (Signed)
Contacted by ED provider regarding request for pt to receive HD at clinic tomorrow off-schedule. Contacted [facility] and spoke to Wayne, [place] who states that he cannot provide appt. Hospital staff or pt will need to call the clinic in the am to see if pt can be worked in. Spoke to [place], ED provider. Pt will d/c to home today. Navigator will f/u with the clinic in the am to see if they can work pt in and contact pt at home with that appt time if there is availability. Provider states that pt has transportation to appt by sister if clinic has availability.   Melven Sartorius Renal Navigator [phone]

## 2022-01-21 NOTE — Discharge Instructions (Addendum)
You are seen in the ER today for your shortness of breath.  Your physical exam and blood work were reassuring.  You do need to go to dialysis tomorrow to make up for the session you missed today.  Please call them first thing tomorrow to secure a seat.  Return to the ER with any new severe symptoms.

## 2022-01-21 NOTE — ED Provider Notes (Signed)
[facility] EMERGENCY DEPARTMENT Provider Note   CSN: [id number] Arrival date & time: 01/21/22  5621     History  Chief Complaint  Patient presents with   Shortness of Breath    [name] is a 71 y.o. female who presents with concern for shortness of breath.  States she usually shortness of breath leading up to the days that she requires dialysis.  She is on hemodialysis 3 times a week Monday/Wednesday/Friday.  She states she was post have dialysis today at noon but around 5:00 this morning was feeling "too short of breath to wait".  She also states that she "likes it better how we do it here" then her experience at her outpatient dialysis center. She states she always feels this way before her dialysis sessions. She does endorse some chest heaviness.    Patient is well-appearing this time, completely asymptomatic.  She is not on any oxygen at home.  I personally reviewed her medical records.  In addition to her ESRD she has history of hyperlipidemia, ischemic colitis. She is not anticoagulated.  HPI    Home Medications Prior to Admission medications   Medication Sig Start Date End Date Taking? Authorizing Provider  acetaminophen (TYLENOL) 500 MG tablet Take 1 tablet (500 mg total) by mouth every 6 (six) hours as needed. 09/05/18   Law, Bea Graff, PA-C  albuterol (VENTOLIN HFA) 108 (90 Base) MCG/ACT inhaler Inhale 1-2 puffs into the lungs every 6 (six) hours as needed for wheezing or shortness of breath. 01/11/22   Mesner, Corene Cornea, MD  allopurinol (ZYLOPRIM) 300 MG tablet Take 300 mg by mouth daily. Patient not taking: Reported on 01/13/2022 11/03/21   [provider]  ALPRAZolam Duanne Moron) 0.25 MG tablet Take 1 tablet (0.25 mg total) by mouth daily as needed for anxiety. 08/18/21   Nita Sells, MD  aspirin EC 81 MG tablet Take 81 mg by mouth daily.    [provider]  atorvastatin (LIPITOR) 40 MG tablet Take 1 tablet (40 mg total) by mouth  daily. 01/13/22   Burnell Blanks, MD  cinacalcet (SENSIPAR) 30 MG tablet Take 30 mg by mouth 3 (three) times a week. Mon, Wed, Fri-- Given @ Dialysis 01/04/20   [provider]  doxycycline (VIBRAMYCIN) 100 MG capsule Take 1 capsule (100 mg total) by mouth 2 (two) times daily. Patient not taking: Reported on 01/13/2022 11/28/21   Quintella Reichert, MD  folic acid-vitamin b complex-vitamin c-selenium-zinc (DIALYVITE) 3 MG TABS tablet Take 1 tablet by mouth daily.    [provider]  losartan (COZAAR) 50 MG tablet Take 50 mg by mouth daily. Patient not taking: Reported on 01/13/2022 11/03/21   [provider]  ondansetron (ZOFRAN) 8 MG tablet Take 8 mg by mouth 2 (two) times daily as needed for nausea or vomiting.     [provider]  pantoprazole (PROTONIX) 40 MG tablet Take 1 tablet (40 mg total) by mouth daily. 08/18/21   Nita Sells, MD  zolpidem (AMBIEN) 10 MG tablet Take 10 mg by mouth at bedtime as needed for sleep. Patient not taking: Reported on 01/13/2022 02/13/21   [provider]      Allergies    Sulfa antibiotics and Adhesive [tape]    Review of Systems   Review of Systems  Constitutional:  Positive for fatigue. Negative for activity change, appetite change, chills, diaphoresis and fever.  HENT: Negative.    Eyes: Negative.   Respiratory: Negative.    Cardiovascular:  Positive for chest pain.  Gastrointestinal: Negative.   Genitourinary: Negative.   Musculoskeletal: Negative.   Skin: Negative.   Neurological:  Positive for weakness. Negative for dizziness, syncope, facial asymmetry, light-headedness and headaches.   Physical Exam Updated Vital Signs BP (!) 111/97    Pulse 83    Temp (!) 97.5 F (36.4 C) (Oral)    Resp (!) 23    Ht 5\' 2"  (1.575 m)    Wt 49.4 kg    SpO2 100%    BMI 19.94 kg/m  Physical Exam Vitals and nursing note reviewed.  Constitutional:      Appearance: She is not ill-appearing or toxic-appearing.   HENT:     Head: Normocephalic and atraumatic.     Mouth/Throat:     Mouth: Mucous membranes are moist.     Pharynx: No oropharyngeal exudate or posterior oropharyngeal erythema.  Eyes:     General:        Right eye: No discharge.        Left eye: No discharge.     Extraocular Movements: Extraocular movements intact.     Conjunctiva/sclera: Conjunctivae normal.     Pupils: Pupils are equal, round, and reactive to light.  Cardiovascular:     Rate and Rhythm: Normal rate and regular rhythm.     Pulses:          Dorsalis pedis pulses are 1+ on the right side and 1+ on the left side.  Pulmonary:     Effort: Pulmonary effort is normal. No respiratory distress.     Breath sounds: Normal breath sounds. No wheezing or rales.  Chest:     Chest wall: No mass, tenderness or edema.  Abdominal:     General: Bowel sounds are normal. There is no distension.     Palpations: Abdomen is soft.     Tenderness: There is no abdominal tenderness.  Musculoskeletal:        General: No deformity.     Cervical back: Neck supple.     Right lower leg: 1+ Edema present.     Left lower leg: 1+ Edema present.       Feet:  Skin:    General: Skin is warm and dry.  Neurological:     Mental Status: She is alert. Mental status is at baseline.  Psychiatric:        Mood and Affect: Mood normal.    ED Results / Procedures / Treatments   Labs (all labs ordered are listed, but only abnormal results are displayed) Labs Reviewed  CBC WITH DIFFERENTIAL/PLATELET - Abnormal; Notable for the following components:      Result Value   RBC 3.56 (*)    Hemoglobin 10.9 (*)    HCT 33.2 (*)    RDW 18.2 (*)    All other components within normal limits  BASIC METABOLIC PANEL - Abnormal; Notable for the following components:   Chloride 94 (*)    BUN 50 (*)    Creatinine, Ser 6.00 (*)    Calcium 8.8 (*)    GFR, Estimated 7 (*)    All other components within normal limits  TROPONIN I (HIGH SENSITIVITY) - Abnormal;  Notable for the following components:   Troponin I (High Sensitivity) 19 (*)    All other components within normal limits  TROPONIN I (HIGH SENSITIVITY)    EKG None  Radiology DG Chest 2 View  Result Date: 01/21/2022 CLINICAL DATA:  Shortness of breath EXAM: CHEST - 2 VIEW COMPARISON:  01/11/2022 FINDINGS: Cardiomegaly. Thickened central vessels with cephalized flow. Patchy reticular opacity correlating with scattered microlithiasis and scar-like appearance on 2020 CT. Large lung volumes with diaphragm flattening. There is no edema, consolidation, effusion, or pneumothorax. IMPRESSION: 1. Chronic cardiomegaly and vascular congestion. 2. Pulmonary scarring. Electronically Signed   By: Jorje Guild M.D.   On: 01/21/2022 05:38    Procedures Procedures    Medications Ordered in ED Medications - No data to display  ED Course/ Medical Decision Making/ A&P Clinical Course as of 01/21/22 1919  Wed Jan 21, 2022  1706 Case discussed with nephrologist Dr. Burnett Sheng who feels patient's clinical presentation and laboratory studies are safe for her to be discharged home for outpatient dialysis hopefully tomorrow.  Case also discussed with renal navigator Olivia Mackie who is agreeable to call the patient's dialysis center first thing in the morning once they open to secure her a chair to be dialyzed tomorrow.  Patient has confirmed that she has transport to and from dialysis with her sister who can drive her.  I appreciate their collaboration in the care of this patient. [RS]    Clinical Course User Index [RS] Jissel Slavens, Gypsy Balsam, PA-C                           Medical Decision Making 71 year old patient who is hemodialysis dependent who presents today with shortness of breath consistent with her symptoms immediately prior to dialysis.  She presented here rather than her dialysis center as she did not want to wait for her appointment there.  Hypertensive on intake of medicine otherwise normal.   Cardiopulmonary exam is normal, abdominal exam is benign.  Patient does have mild bilateral lower extremity edema with symmetric 1+ pedal pulses.   Amount and/or Complexity of Data Reviewed Labs:     Details: CBC with mild anemia with hemoglobin of 10.9 near her baseline.  BMP with evidence of ESRD but without electrolyte derangement.  Troponin 19 near patient's baseline.  She remains asymptomatic at this time. Radiology:     Details: Chest x-ray negative for acute cardiopulmonary disease. ECG/medicine tests:     Details: EKG with sinus rhythm without STEMI.  No peaked T's. Discussion of management or test interpretation with external provider(s): Consult to Dr. Jonnie Finner, nephrologist above as well as the renal coordinator.  Plan to discharge patient home given overall very reassuring clinical picture without electrolyte or laboratory derangements.  She may be dialyzed tomorrow at her outpatient setting.  Linnaea voiced understanding of her medical evaluation and treatment plan.  Her questions was answered to her expressed satisfaction.  Return precautions were given.  Patient is well-appearing, stable, and was discharged in good condition.  This chart was dictated using voice recognition software, Dragon. Despite the best efforts of this provider to proofread and correct errors, errors may still occur which can change documentation meaning.    Final Clinical Impression(s) / ED Diagnoses Final diagnoses:  SOB (shortness of breath)    Rx / DC Orders ED Discharge Orders     None         Aura Dials 01/21/22 1919    Davonna Belling, MD 01/21/22 2328

## 2022-01-21 NOTE — ED Notes (Signed)
RN updated [name], sister for pt to be picked up

## 2022-01-21 NOTE — ED Provider Triage Note (Signed)
Emergency Medicine Provider Triage Evaluation Note  [name] , a 71 y.o. female  was evaluated in triage.  Pt complains of SOB.  States that she is due for dialysis today and she likes how we do it here.  She states that she becomes more short of breath with walking.  Denies fever.  Review of Systems  Positive: SOB Negative: Fever, chills  Physical Exam  BP (!) 137/91 (BP Location: Right Leg)    Pulse 85    Temp 97.9 F (36.6 C) (Oral)    Resp 14    SpO2 98%  Gen:   Awake, no distress   Resp:  Normal effort  MSK:   Moves extremities without difficulty  Other:    Medical Decision Making  Medically screening exam initiated at 5:15 AM.  Appropriate orders placed.  [name] was informed that the remainder of the evaluation will be completed by another provider, this initial triage assessment does not replace that evaluation, and the importance of remaining in the ED until their evaluation is complete.  sob   [name], PA-C 01/21/22 [phone]

## 2022-01-21 NOTE — ED Triage Notes (Signed)
Pt presents to the ED from home via [facility] with complaints of SOB. Pt normally goes to HD at 12 on M/W/F, called ems this am for SOB because she couldn't wait. Pt states she likes how we do her dialysis here and not at her designated place. Denies fever.

## 2022-01-22 ENCOUNTER — Emergency Department (HOSPITAL_COMMUNITY): Payer: Medicare Other

## 2022-01-22 ENCOUNTER — Observation Stay (HOSPITAL_COMMUNITY)
Admission: EM | Admit: 2022-01-22 | Discharge: 2022-01-24 | Disposition: A | Discharge status: Home / Self Care | Payer: Medicare Other | Attending: Internal Medicine | Admitting: Internal Medicine

## 2022-01-22 ENCOUNTER — Encounter (HOSPITAL_COMMUNITY): Payer: Self-pay | Admitting: *Deleted

## 2022-01-22 ENCOUNTER — Ambulatory Visit: Payer: Medicare HMO | Admitting: Adult Health

## 2022-01-22 DIAGNOSIS — E875 Hyperkalemia: Secondary | ICD-10-CM | POA: Insufficient documentation

## 2022-01-22 DIAGNOSIS — Z87891 Personal history of nicotine dependence: Secondary | ICD-10-CM | POA: Insufficient documentation

## 2022-01-22 DIAGNOSIS — Z7982 Long term (current) use of aspirin: Secondary | ICD-10-CM | POA: Diagnosis not present

## 2022-01-22 DIAGNOSIS — R1084 Generalized abdominal pain: Secondary | ICD-10-CM | POA: Diagnosis not present

## 2022-01-22 DIAGNOSIS — R059 Cough, unspecified: Secondary | ICD-10-CM | POA: Diagnosis not present

## 2022-01-22 DIAGNOSIS — Z79899 Other long term (current) drug therapy: Secondary | ICD-10-CM | POA: Diagnosis not present

## 2022-01-22 DIAGNOSIS — I5032 Chronic diastolic (congestive) heart failure: Secondary | ICD-10-CM | POA: Diagnosis not present

## 2022-01-22 DIAGNOSIS — Z992 Dependence on renal dialysis: Secondary | ICD-10-CM | POA: Diagnosis not present

## 2022-01-22 DIAGNOSIS — N186 End stage renal disease: Secondary | ICD-10-CM | POA: Insufficient documentation

## 2022-01-22 DIAGNOSIS — I2510 Atherosclerotic heart disease of native coronary artery without angina pectoris: Secondary | ICD-10-CM | POA: Insufficient documentation

## 2022-01-22 DIAGNOSIS — R1030 Lower abdominal pain, unspecified: Secondary | ICD-10-CM | POA: Diagnosis not present

## 2022-01-22 DIAGNOSIS — R1032 Left lower quadrant pain: Secondary | ICD-10-CM | POA: Diagnosis not present

## 2022-01-22 DIAGNOSIS — Z20822 Contact with and (suspected) exposure to covid-19: Secondary | ICD-10-CM | POA: Insufficient documentation

## 2022-01-22 DIAGNOSIS — R109 Unspecified abdominal pain: Secondary | ICD-10-CM

## 2022-01-22 DIAGNOSIS — R0602 Shortness of breath: Secondary | ICD-10-CM | POA: Diagnosis not present

## 2022-01-22 DIAGNOSIS — K5792 Diverticulitis of intestine, part unspecified, without perforation or abscess without bleeding: Secondary | ICD-10-CM | POA: Diagnosis not present

## 2022-01-22 DIAGNOSIS — R079 Chest pain, unspecified: Secondary | ICD-10-CM | POA: Diagnosis not present

## 2022-01-22 DIAGNOSIS — R531 Weakness: Secondary | ICD-10-CM | POA: Diagnosis not present

## 2022-01-22 DIAGNOSIS — I110 Hypertensive heart disease with heart failure: Secondary | ICD-10-CM | POA: Insufficient documentation

## 2022-01-22 DIAGNOSIS — R110 Nausea: Secondary | ICD-10-CM | POA: Diagnosis not present

## 2022-01-22 LAB — CBC
HCT: 34.5 % — ABNORMAL LOW (ref 36.0–46.0)
Hemoglobin: 11.2 g/dL — ABNORMAL LOW (ref 12.0–15.0)
MCH: 30.4 pg (ref 26.0–34.0)
MCHC: 32.5 g/dL (ref 30.0–36.0)
MCV: 93.8 fL (ref 80.0–100.0)
Platelets: 187 10*3/uL (ref 150–400)
RBC: 3.68 MIL/uL — ABNORMAL LOW (ref 3.87–5.11)
RDW: 18.2 % — ABNORMAL HIGH (ref 11.5–15.5)
WBC: 6.4 10*3/uL (ref 4.0–10.5)
nRBC: 0 % (ref 0.0–0.2)

## 2022-01-22 LAB — I-STAT CHEM 8, ED
BUN: 67 mg/dL — ABNORMAL HIGH (ref 8–23)
BUN: 78 mg/dL — ABNORMAL HIGH (ref 8–23)
Calcium, Ion: 0.98 mmol/L — ABNORMAL LOW (ref 1.15–1.40)
Calcium, Ion: 0.94 mmol/L — ABNORMAL LOW (ref 1.15–1.40)
Chloride: 96 mmol/L — ABNORMAL LOW (ref 98–111)
Chloride: 97 mmol/L — ABNORMAL LOW (ref 98–111)
Creatinine, Ser: 8.6 mg/dL — ABNORMAL HIGH (ref 0.44–1.00)
Creatinine, Ser: 8.4 mg/dL — ABNORMAL HIGH (ref 0.44–1.00)
Glucose, Bld: 84 mg/dL (ref 70–99)
Glucose, Bld: 88 mg/dL (ref 70–99)
HCT: 35 % — ABNORMAL LOW (ref 36.0–46.0)
HCT: 41 % (ref 36.0–46.0)
Hemoglobin: 11.9 g/dL — ABNORMAL LOW (ref 12.0–15.0)
Hemoglobin: 13.9 g/dL (ref 12.0–15.0)
Potassium: 5.4 mmol/L — ABNORMAL HIGH (ref 3.5–5.1)
Potassium: 5.8 mmol/L — ABNORMAL HIGH (ref 3.5–5.1)
Sodium: 135 mmol/L (ref 135–145)
Sodium: 134 mmol/L — ABNORMAL LOW (ref 135–145)
TCO2: 28 mmol/L (ref 22–32)
TCO2: 29 mmol/L (ref 22–32)

## 2022-01-22 LAB — COMPREHENSIVE METABOLIC PANEL
ALT: 14 U/L (ref 0–44)
AST: 27 U/L (ref 15–41)
Albumin: 3.2 g/dL — ABNORMAL LOW (ref 3.5–5.0)
Alkaline Phosphatase: 173 U/L — ABNORMAL HIGH (ref 38–126)
Anion gap: 18 — ABNORMAL HIGH (ref 5–15)
BUN: 71 mg/dL — ABNORMAL HIGH (ref 8–23)
CO2: 25 mmol/L (ref 22–32)
Calcium: 9 mg/dL (ref 8.9–10.3)
Chloride: 94 mmol/L — ABNORMAL LOW (ref 98–111)
Creatinine, Ser: 7.7 mg/dL — ABNORMAL HIGH (ref 0.44–1.00)
GFR, Estimated: 5 mL/min — ABNORMAL LOW (ref >60)
Glucose, Bld: 85 mg/dL (ref 70–99)
Potassium: 5.5 mmol/L — ABNORMAL HIGH (ref 3.5–5.1)
Sodium: 137 mmol/L (ref 135–145)
Total Bilirubin: 0.7 mg/dL (ref 0.3–1.2)
Total Protein: 7.4 g/dL (ref 6.5–8.1)

## 2022-01-22 LAB — MAGNESIUM: Magnesium: 2.4 mg/dL (ref 1.7–2.4)

## 2022-01-22 LAB — RESP PANEL BY RT-PCR (FLU A&B, COVID) ARPGX2
Influenza A by PCR: NEGATIVE
Influenza B by PCR: NEGATIVE
SARS Coronavirus 2 by RT PCR: NEGATIVE

## 2022-01-22 LAB — TROPONIN I (HIGH SENSITIVITY)
Troponin I (High Sensitivity): 23 ng/L — ABNORMAL HIGH (ref <18)
Troponin I (High Sensitivity): 24 ng/L — ABNORMAL HIGH (ref <18)

## 2022-01-22 LAB — LIPASE, BLOOD: Lipase: 53 U/L — ABNORMAL HIGH (ref 11–51)

## 2022-01-22 LAB — POTASSIUM: Potassium: 7.2 mmol/L (ref 3.5–5.1)

## 2022-01-22 MED ORDER — HYDROCODONE-ACETAMINOPHEN 5-325 MG PO TABS
1.0000 | ORAL_TABLET | ORAL | Status: DC | PRN
  Administered 2022-01-23 – 2022-01-24 (×2): 1 via ORAL
  Filled 2022-01-22 (×2): qty 1

## 2022-01-22 MED ORDER — ALPRAZOLAM 0.5 MG PO TABS: 0.2500 mg | ORAL_TABLET | Freq: Every day | ORAL | Status: DC | PRN

## 2022-01-22 MED ORDER — PANTOPRAZOLE SODIUM 40 MG PO TBEC
40.0000 mg | DELAYED_RELEASE_TABLET | Freq: Every day | ORAL | Status: DC
Start: 2022-01-22 — End: 2022-01-24
  Administered 2022-01-22 – 2022-01-24 (×3): 40 mg via ORAL
  Filled 2022-01-22 (×3): qty 1

## 2022-01-22 MED ORDER — ZOLPIDEM TARTRATE 5 MG PO TABS
5.0000 mg | ORAL_TABLET | Freq: Every evening | ORAL | Status: DC | PRN
  Administered 2022-01-22 – 2022-01-23 (×2): 5 mg via ORAL
  Filled 2022-01-22 (×2): qty 1

## 2022-01-22 MED ORDER — SODIUM BICARBONATE 8.4 % IV SOLN
50.0000 meq | Freq: Once | INTRAVENOUS | Status: AC
  Administered 2022-01-22: 50 meq via INTRAVENOUS
  Filled 2022-01-22: qty 50

## 2022-01-22 MED ORDER — ALBUTEROL SULFATE (2.5 MG/3ML) 0.083% IN NEBU
10.0000 mg | INHALATION_SOLUTION | Freq: Once | RESPIRATORY_TRACT | Status: AC
  Administered 2022-01-22: 10 mg via RESPIRATORY_TRACT
  Filled 2022-01-22: qty 12

## 2022-01-22 MED ORDER — SODIUM POLYSTYRENE SULFONATE 15 GM/60ML PO SUSP
30.0000 g | Freq: Once | ORAL | Status: AC
  Administered 2022-01-22: 30 g via ORAL
  Filled 2022-01-22: qty 120

## 2022-01-22 MED ORDER — ACETAMINOPHEN 500 MG PO TABS: 500.0000 mg | ORAL_TABLET | Freq: Four times a day (QID) | ORAL | Status: DC | PRN

## 2022-01-22 MED ORDER — ATORVASTATIN CALCIUM 40 MG PO TABS
40.0000 mg | ORAL_TABLET | Freq: Every day | ORAL | Status: DC
  Administered 2022-01-22 – 2022-01-24 (×3): 40 mg via ORAL
  Filled 2022-01-22 (×3): qty 1

## 2022-01-22 MED ORDER — CINACALCET HCL 30 MG PO TABS
30.0000 mg | ORAL_TABLET | ORAL | Status: DC
  Administered 2022-01-23: 30 mg via ORAL
  Filled 2022-01-22: qty 1

## 2022-01-22 MED ORDER — ENSURE ENLIVE PO LIQD
237.0000 mL | Freq: Two times a day (BID) | ORAL | Status: DC
  Administered 2022-01-22 – 2022-01-24 (×3): 237 mL via ORAL
  Filled 2022-01-22: qty 237

## 2022-01-22 MED ORDER — SODIUM ZIRCONIUM CYCLOSILICATE 10 G PO PACK
10.0000 g | PACK | Freq: Once | ORAL | Status: AC
Start: 2022-01-22 — End: 2022-01-22
  Administered 2022-01-22: 10 g via ORAL
  Filled 2022-01-22: qty 1

## 2022-01-22 MED ORDER — DEXTROSE 50 % IV SOLN
1.0000 | Freq: Once | INTRAVENOUS | Status: AC
  Administered 2022-01-22: 50 mL via INTRAVENOUS
  Filled 2022-01-22: qty 50

## 2022-01-22 MED ORDER — ASPIRIN EC 81 MG PO TBEC
81.0000 mg | DELAYED_RELEASE_TABLET | Freq: Every day | ORAL | Status: DC
  Administered 2022-01-22 – 2022-01-23 (×2): 81 mg via ORAL
  Filled 2022-01-22 (×2): qty 1

## 2022-01-22 MED ORDER — ALBUTEROL SULFATE (2.5 MG/3ML) 0.083% IN NEBU: 3.0000 mL | INHALATION_SOLUTION | Freq: Four times a day (QID) | RESPIRATORY_TRACT | Status: DC | PRN

## 2022-01-22 MED ORDER — ALLOPURINOL 300 MG PO TABS
300.0000 mg | ORAL_TABLET | Freq: Every day | ORAL | Status: DC
  Administered 2022-01-22 – 2022-01-24 (×3): 300 mg via ORAL
  Filled 2022-01-22 (×4): qty 1

## 2022-01-22 MED ORDER — AMOXICILLIN-POT CLAVULANATE 250-125 MG PO TABS
1.0000 | ORAL_TABLET | Freq: Every day | ORAL | Status: DC
  Administered 2022-01-22 – 2022-01-23 (×2): 1 via ORAL
  Filled 2022-01-22 (×3): qty 1

## 2022-01-22 MED ORDER — RISAQUAD PO CAPS
2.0000 | ORAL_CAPSULE | Freq: Three times a day (TID) | ORAL | Status: DC
  Administered 2022-01-22 – 2022-01-24 (×5): 2 via ORAL
  Filled 2022-01-22 (×8): qty 2

## 2022-01-22 MED ORDER — INSULIN ASPART 100 UNIT/ML IV SOLN
5.0000 [IU] | Freq: Once | INTRAVENOUS | Status: AC
  Administered 2022-01-22: 5 [IU] via INTRAVENOUS

## 2022-01-22 MED ORDER — METOPROLOL TARTRATE 12.5 MG HALF TABLET
12.5000 mg | ORAL_TABLET | Freq: Two times a day (BID) | ORAL | Status: DC
  Administered 2022-01-22: 12.5 mg via ORAL
  Filled 2022-01-22 (×3): qty 1

## 2022-01-22 MED ORDER — CHLORHEXIDINE GLUCONATE CLOTH 2 % EX PADS
6.0000 | MEDICATED_PAD | Freq: Every day | CUTANEOUS | Status: DC
  Administered 2022-01-23 – 2022-01-24 (×2): 6 via TOPICAL

## 2022-01-22 MED ORDER — HEPARIN SODIUM (PORCINE) 5000 UNIT/ML IJ SOLN
5000.0000 [IU] | Freq: Two times a day (BID) | INTRAMUSCULAR | Status: DC
  Administered 2022-01-22 – 2022-01-24 (×5): 5000 [IU] via SUBCUTANEOUS
  Filled 2022-01-22 (×5): qty 1

## 2022-01-22 MED ORDER — CALCIUM GLUCONATE-NACL 1-0.675 GM/50ML-% IV SOLN
1.0000 g | Freq: Once | INTRAVENOUS | Status: AC
  Administered 2022-01-22: 1000 mg via INTRAVENOUS
  Filled 2022-01-22: qty 50

## 2022-01-22 NOTE — H&P (Signed)
History and Physical    DASHEA MCMULLAN SWN:[id number] DOB: May 17, 1951 DOA: 01/22/2022  PCP: [name], MD (Confirm with patient/family/NH records and if not entered, this has to be entered at [facility] point of entry) Patient coming from: Home  I have personally briefly reviewed patient's old medical records in [place]  Chief Complaint: Abdominal pain, diarrhea  HPI: [name] is a 71 y.o. female with medical history significant of ESRD on HD, MWF, chronic diastolic CHF, HTN, HLD, anemia of chronic disease, presented with worsening of abdominal pain and diarrhea.  Patient started to have a cramping-like left lower quadrant abdominal pain since yesterday morning, intermittent.  She came to ED yesterday morning, asking for dialysis, and was reassured and sent home.  She denies any chest pain, no cough no fever or chills.  As she was in the ED whole day yesterday, she missed yesterday's dialysis.  Overnight, she had a loose bowel movement nonbloody, and she woke up this morning with worsening of cramp-like abdominal pain same location, then she passed another loose bowel movement in the ED.  She had diverticulitis last year and she felt it was the exact symptoms she had diverticulitis and last time.  ED Course: Afebrile, no tachycardia noted hypotension.  WBC 6.2, BUN 67, creatinine 8.6, K5.4.  Nephrology contacted and 1 dose of Lokelma ordered by ED.  Nephrology will arrange dialysis tonight versus tomorrow morning.  Review of Systems: As per HPI otherwise 14 point review of systems negative.    Past Medical History:  Diagnosis Date   Adenomatous polyp of colon 10/2010, 2006, 2015   Anemia in CKD (chronic kidney disease) 11/07/2012   s/p blood transfusion.    Arthritis    CAD (coronary artery disease)    "something like that"   CHF (congestive heart failure) (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  Had some early rejection then stable function for years, then had slow decline of function and went back on hemodialysis in 2012.  Gets HD TTS schedule at [facility] on [facility] still using L forearm AVF.      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[place])    left upper arm   Pulmonary edema 12/2019   Stroke ([place]) 11/2015   TIA   Weight loss, unintentional     Past Surgical History:  Procedure Laterality Date   AV FISTULA PLACEMENT     for dialysis   AV FISTULA PLACEMENT Left 11/22/2015   Procedure: ARTERIOVENOUS (AV) FISTULA CREATION-LEFT BRACHIOCEPHALIC;  Surgeon: Serafina Mitchell, MD;  Location: [place];  Service: Vascular;  Laterality: Left;   AV FISTULA PLACEMENT Right 03/15/2020   Procedure: INSERTION OF ARTERIOVENOUS (AV) GORE-TEX GRAFT ARM ( BRACHIAL AXILLARY );  Surgeon: Katha Cabal, MD;  Location: [facility];  Service: Vascular;  Laterality: Right;   BACK SURGERY     CERVICAL FUSION     CHOLECYSTECTOMY  12/02/2012   Procedure: LAPAROSCOPIC CHOLECYSTECTOMY  WITH INTRAOPERATIVE CHOLANGIOGRAM;  Surgeon: Edward Jolly, MD;  Location: [place];  Service: General;  Laterality: N/A;   EYE SURGERY Bilateral    cataract surgery   EYE SURGERY Left 2019   laser   HEMATOMA EVACUATION Left 12/24/2016   Procedure: EVACUATION HEMATOMA LEFT UPPER ARM;  Surgeon: Waynetta Sandy, MD;  Location: [place];  Service: Vascular;  Laterality: Left;   I & D EXTREMITY Left 12/31/2016   Procedure: IRRIGATION AND DEBRIDEMENT EXTREMITY;  Surgeon: Angelia Mould, MD;  Location: [place];  Service: Vascular;  Laterality: Left;    INSERTION OF DIALYSIS CATHETER Right 12/24/2016   Procedure: INSERTION OF DIALYSIS CATHETER;  Surgeon: Waynetta Sandy, MD;  Location: [place];  Service: Vascular;  Laterality: Right;   INSERTION OF DIALYSIS CATHETER Right 02/04/2017   Procedure: INSERTION OF DIALYSIS CATHETER;  Surgeon: Waynetta Sandy, MD;  Location: [place];  Service: Vascular;  Laterality: Right;   [place] Left 10/23/2016   Procedure: Fistulagram;  Surgeon: Elam Dutch, MD;  Location: [place] [facility];  Service: Cardiovascular;  Laterality: Left;   PERIPHERAL VASCULAR THROMBECTOMY Right 04/16/2020   Procedure: PERIPHERAL VASCULAR THROMBECTOMY;  Surgeon: Katha Cabal, MD;  Location: [place] [facility];  Service: Cardiovascular;  Laterality: Right;   RESECTION OF ARTERIOVENOUS FISTULA ANEURYSM Left 11/22/2015   Procedure: RESECTION OF LEFT RADIOCEPHALIC FISTULA ANEURYSM ;  Surgeon: Serafina Mitchell, MD;  Location: [place] [facility];  Service: Vascular;  Laterality: Left;   REVISON OF ARTERIOVENOUS FISTULA Left 12/22/2016   Procedure: REVISON OF LEFT ARTERIOVENOUS FISTULA;  Surgeon: Waynetta Sandy, MD;  Location: [place];  Service: Vascular;  Laterality: Left;   REVISON OF ARTERIOVENOUS FISTULA Left 02/04/2017   Procedure: REVISON OF LEFT UPPER ARM ARTERIOVENOUS FISTULA;  Surgeon: Waynetta Sandy, MD;  Location: [place];  Service: Vascular;  Laterality: Left;   UPPER EXTREMITY ANGIOGRAPHY Bilateral 09/19/2019   Procedure: UPPER EXTREMITY ANGIOGRAPHY;  Surgeon: Katha Cabal, MD;  Location: [place] [facility];  Service: Cardiovascular;  Laterality: Bilateral;     reports that she quit smoking about 30 years ago. Her smoking use included cigarettes. She has never used smokeless tobacco. She reports that she does not drink alcohol and does not use drugs.  Allergies  Allergen Reactions   Sulfa Antibiotics Other (See Comments)    Per patient, both  parents allergic-so will not take   Adhesive [Tape] Itching    Family History  Problem Relation Age of Onset   Colon cancer Brother    Cancer Brother    Coronary artery disease Mother 72   Hyperlipidemia Mother    Hypertension Mother    Stroke Maternal Aunt    Esophageal cancer Neg Hx    Stomach cancer Neg Hx    Rectal cancer Neg Hx      Prior to Admission medications   Medication Sig Start Date End Date Taking? Authorizing Provider  ALPRAZolam (XANAX) 0.25 MG tablet Take 1 tablet (0.25 mg total) by mouth daily as needed for anxiety. 08/18/21  Yes Nita Sells, MD  aspirin EC 81 MG tablet Take 81 mg by mouth at bedtime.   Yes [provider]  acetaminophen (TYLENOL) 500 MG tablet Take 1 tablet (500 mg total) by mouth every 6 (six) hours as needed. 09/05/18   Law, Bea Graff, PA-C  albuterol (VENTOLIN HFA) 108 (90 Base) MCG/ACT inhaler Inhale 1-2 puffs into the lungs every 6 (six)  hours as needed for wheezing or shortness of breath. 01/11/22   Mesner, Corene Cornea, MD  allopurinol (ZYLOPRIM) 300 MG tablet Take 300 mg by mouth daily. 11/03/21   [provider]  atorvastatin (LIPITOR) 40 MG tablet Take 1 tablet (40 mg total) by mouth daily. 01/13/22   Burnell Blanks, MD  cinacalcet (SENSIPAR) 30 MG tablet Take 30 mg by mouth 3 (three) times a week. Mon, Wed, Fri-- Given @ Dialysis 01/04/20   [provider]  folic acid-vitamin b complex-vitamin c-selenium-zinc (DIALYVITE) 3 MG TABS tablet Take 1 tablet by mouth daily.    [provider]  losartan (COZAAR) 50 MG tablet Take 50 mg by mouth daily. 11/03/21   [provider]  ondansetron (ZOFRAN) 8 MG tablet Take 8 mg by mouth 2 (two) times daily as needed for nausea or vomiting.     [provider]  pantoprazole (PROTONIX) 40 MG tablet Take 1 tablet (40 mg total) by mouth daily. 08/18/21   Nita Sells, MD  zolpidem (AMBIEN) 10 MG tablet Take 10 mg by mouth at bedtime as needed  for sleep. 02/13/21   [provider]    Physical Exam: Vitals:   01/22/22 1130 01/22/22 1319 01/22/22 1320 01/22/22 1325  BP: 107/72  123/78 123/78  Pulse: 95 (!) 102 100 94  Resp: 16 17 16 20   Temp: 97.6 F (36.4 C)     TempSrc: Oral     SpO2: 100% 97% 95% 99%    Constitutional: NAD, calm, comfortable Vitals:   01/22/22 1130 01/22/22 1319 01/22/22 1320 01/22/22 1325  BP: 107/72  123/78 123/78  Pulse: 95 (!) 102 100 94  Resp: 16 17 16 20   Temp: 97.6 F (36.4 C)     TempSrc: Oral     SpO2: 100% 97% 95% 99%   Eyes: PERRL, lids and conjunctivae normal ENMT: Mucous membranes are moist. Posterior pharynx clear of any exudate or lesions.Normal dentition.  Neck: normal, supple, no masses, no thyromegaly Respiratory: clear to auscultation bilaterally, no wheezing, no crackles. Normal respiratory effort. No accessory muscle use.  Cardiovascular: Regular rate and rhythm, no murmurs / rubs / gallops. No extremity edema. 2+ pedal pulses. No carotid bruits.  Abdomen: mild tenderness on LLQ, no rebound, no guarding, no masses palpated. No hepatosplenomegaly. Bowel sounds positive.  Musculoskeletal: no clubbing / cyanosis. No joint deformity upper and lower extremities. Good ROM, no contractures. Normal muscle tone.  Skin: no rashes, lesions, ulcers. No induration Neurologic: CN 2-12 grossly intact. Sensation intact, DTR normal. Strength 5/5 in all 4.  Psychiatric: Normal judgment and insight. Alert and oriented x 3. Normal mood.     Labs on Admission: I have personally reviewed following labs and imaging studies  CBC: Recent Labs  Lab 01/21/22 0522 01/22/22 0815 01/22/22 0818 01/22/22 1356  WBC 5.8  --  6.4  --   NEUTROABS 3.1  --   --   --   HGB 10.9* 11.9* 11.2* 13.9  HCT 33.2* 35.0* 34.5* 41.0  MCV 93.3  --  93.8  --   PLT 184  --  187  --    Basic Metabolic Panel: Recent Labs  Lab 01/21/22 0522 01/22/22 0815 01/22/22 0818 01/22/22 1356  NA 139 135 137 134*   K 4.4 5.4* 5.5* 5.8*  CL 94* 96* 94* 97*  CO2 31  --  25  --   GLUCOSE 83 84 85 88  BUN 50* 67* 71* 78*  CREATININE 6.00* 8.60* 7.70* 8.40*  CALCIUM 8.8*  --  9.0  --    GFR: Estimated Creatinine Clearance: 4.8 mL/min (A) (by C-G formula based on SCr of 8.4 mg/dL (H)). Liver Function Tests: Recent Labs  Lab 01/22/22 0818  AST 27  ALT 14  ALKPHOS 173*  BILITOT 0.7  PROT 7.4  ALBUMIN 3.2*   Recent Labs  Lab 01/22/22 0818  LIPASE 53*   No results for input(s): AMMONIA in the last 168 hours. Coagulation Profile: No results for input(s): INR, PROTIME in the last 168 hours. Cardiac Enzymes: No results for input(s): CKTOTAL, CKMB, CKMBINDEX, TROPONINI in the last 168 hours. BNP (last 3 results) No results for input(s): PROBNP in the last 8760 hours. HbA1C: No results for input(s): HGBA1C in the last 72 hours. CBG: No results for input(s): GLUCAP in the last 168 hours. Lipid Profile: No results for input(s): CHOL, HDL, LDLCALC, TRIG, CHOLHDL, LDLDIRECT in the last 72 hours. Thyroid Function Tests: No results for input(s): TSH, T4TOTAL, FREET4, T3FREE, THYROIDAB in the last 72 hours. Anemia Panel: No results for input(s): VITAMINB12, FOLATE, FERRITIN, TIBC, IRON, RETICCTPCT in the last 72 hours. Urine analysis:    Component Value Date/Time   COLORURINE YELLOW 01/30/2011 1648   APPEARANCEUR CLOUDY (A) 01/30/2011 1648   LABSPEC 1.016 01/30/2011 1648   PHURINE 5.0 01/30/2011 1648   GLUCOSEU NEGATIVE 10/14/2009 0747   HGBUR SMALL (A) 01/30/2011 1648   BILIRUBINUR NEGATIVE 01/30/2011 1648   KETONESUR NEGATIVE 01/30/2011 1648   PROTEINUR 100 (A) 01/30/2011 1648   UROBILINOGEN 0.2 01/30/2011 1648   NITRITE NEGATIVE 01/30/2011 1648   LEUKOCYTESUR SMALL (A) 01/30/2011 1648    Radiological Exams on Admission: DG Chest 2 View  Result Date: 01/21/2022 CLINICAL DATA:  Shortness of breath EXAM: CHEST - 2 VIEW COMPARISON:  01/11/2022 FINDINGS: Cardiomegaly. Thickened central  vessels with cephalized flow. Patchy reticular opacity correlating with scattered microlithiasis and scar-like appearance on 2020 CT. Large lung volumes with diaphragm flattening. There is no edema, consolidation, effusion, or pneumothorax. IMPRESSION: 1. Chronic cardiomegaly and vascular congestion. 2. Pulmonary scarring. Electronically Signed   By: Jorje Guild M.D.   On: 01/21/2022 05:38   DG CHEST PORT 1 VIEW  Result Date: 01/22/2022 CLINICAL DATA:  Cough, shortness of breath, and weakness. Nausea and abdominal pain. EXAM: PORTABLE CHEST 1 VIEW COMPARISON:  Chest x-ray from yesterday. FINDINGS: Unchanged cardiomegaly. Similar chronic pulmonary vascular congestion and mild interstitial thickening, most apparent at the left lung base. No focal consolidation, pleural effusion, or pneumothorax. No acute osseous abnormality. IMPRESSION: 1. Chronic cardiomegaly and mild pulmonary interstitial edema. Electronically Signed   By: Titus Dubin M.D.   On: 01/22/2022 13:43   DG Abdomen Acute W/Chest  Result Date: 01/22/2022 CLINICAL DATA:  Chest and abdominal pain EXAM: DG ABDOMEN ACUTE WITH 1 VIEW CHEST COMPARISON:  Radiograph dated December 19, 2012 FINDINGS: Nonobstructive bowel gas pattern. In aortoiliac vascular calcifications. Visualized lung bases demonstrate bibasilar atelectasis and cardiomegaly. No acute osseous abnormality. IMPRESSION: Nonobstructive bowel gas pattern. Electronically Signed   By: Yetta Glassman M.D.   On: 01/22/2022 09:03    EKG: Independently reviewed.  Sinus, borderline QTc punctation.  Frequent PVCs.  Assessment/Plan Principal Problem:   Diverticulitis  (please populate well all problems here in Problem List. (For example, if patient is on BP meds at home and you resume or decide to hold them, it is a problem that needs to be her. Same for CAD, COPD, HLD and so on)  Acute abdominal pain -Clinically suspect recurrent diverticulitis,  nonsevere, no fever no leukocytosis.   Given the likelihood of developing into full-blown diverticulitis, will treat short course with Augmentin empirically.  Discussed with patient and she agreed.  Given the localized symptoms and rather benign clinical course, decided not to pursue CT abdomen.  Hyperkalemia -From missed dialysis, treated with Lokelma x1, HD tonight versus tomorrow morning as per nephrology. -Recheck potassium level tonight.  Frequent PVCs -Check magnesium level, TSH. -Telemetry monitoring, start low-dose of beta-blocker.  Chronic diastolic CHF -Appears to be mildly overloaded but no hypoxia no hypotension, HD tonight versus tomorrow.  ESRD on HD -HD as above, continue Sensipar  Moderate protein calorie malnutrition -Start protein supplements, outpatient nutrition follow-up.  DVT prophylaxis: Heparin subcu Code Status: Full code Family Communication: None at bedside Disposition Plan: Expect less than 2 midnight hospital stay Consults called: Nephrology Admission status: Telemetry observation   Lequita Halt MD [facility] Pager [phone]  01/22/2022, 3:01 PM

## 2022-01-22 NOTE — Progress Notes (Signed)
Cross-coverage note:   Notified that potassium has increased to 7.2.   EKG obtained, QRS has not widened. Appreciate nephrology consultants, discussed with Dr. Justin Mend, plan to give temporizing medications now, continue close monitoring.

## 2022-01-22 NOTE — Progress Notes (Signed)
[facility] BRIEF PROGRESS NOTE  EVALIE HARGRAVES 1951/02/09 [id number]  71 year old ESRD patient on HD MWF at [place] presents to the ED due to abdominal pain and diarrhea.  Seen in the ED yesterday due to shortness of breath and increased volume. Patient typically compliant with prescribed dialysis regimen.  Completed full HD on 01/19/22 and missed yesterday due to being in ED. Seen and examined in the ED.  Admits to shortness of breath most days around this same.  CXR noted mild interstitial edema. Oxygenating 100% on RA with normal work of breathing.  Pertinent labs include K 5.8, BUN 78, SCr 8.4.  OP HD orders: [place] TTS 4hrs 350/600 46.5kg 2K 2Ca UFP2 AVG Sensipar 30mg  qHD Hect 4 Venofer 50 qwk  Plan: No acute indications for HD today.  Lokelma ordered.  Plan for HD tomorrow per regular schedule due to high patient census/acuity.   Jen Mow, PA-C [facility] Pager: [phone]

## 2022-01-22 NOTE — ED Provider Notes (Addendum)
[facility] EMERGENCY DEPARTMENT Provider Note   CSN: [id number] Arrival date & time: 01/22/22  0734     History  Chief Complaint  Patient presents with   Abdominal Pain    [name] is a 71 y.o. female. With past medical history of end-stage renal disease on Monday Wednesday Friday dialysis, hypertension, hyperlipidemia, congestive heart failure, diverticulitis who presents to the emergency department with abdominal pain.  Patient states that this morning she began having nonbloody diarrhea as well as some left lower quadrant abdominal pain.  She states that she was supposed to go to dialysis today however because of her abdominal pain she presents to the emergency department.  She has had associated nausea without vomiting.  She endorses ongoing shortness of breath which she has had for 1 week now.  She was seen for this yesterday.  She was supposed to go to dialysis this morning.  She missed her Wednesday dialysis.  She denies any chest pain, fevers.   Abdominal Pain Associated symptoms: diarrhea and shortness of breath   Associated symptoms: no chest pain, no fever, no nausea and no vomiting       Home Medications Prior to Admission medications   Medication Sig Start Date End Date Taking? Authorizing Provider  acetaminophen (TYLENOL) 500 MG tablet Take 1 tablet (500 mg total) by mouth every 6 (six) hours as needed. 09/05/18   Law, Bea Graff, PA-C  albuterol (VENTOLIN HFA) 108 (90 Base) MCG/ACT inhaler Inhale 1-2 puffs into the lungs every 6 (six) hours as needed for wheezing or shortness of breath. 01/11/22   Mesner, Corene Cornea, MD  allopurinol (ZYLOPRIM) 300 MG tablet Take 300 mg by mouth daily. Patient not taking: Reported on 01/13/2022 11/03/21   [provider]  ALPRAZolam Duanne Moron) 0.25 MG tablet Take 1 tablet (0.25 mg total) by mouth daily as needed for anxiety. 08/18/21   Nita Sells, MD  aspirin EC 81 MG tablet Take 81 mg by mouth daily.     [provider]  atorvastatin (LIPITOR) 40 MG tablet Take 1 tablet (40 mg total) by mouth daily. 01/13/22   Burnell Blanks, MD  cinacalcet (SENSIPAR) 30 MG tablet Take 30 mg by mouth 3 (three) times a week. Mon, Wed, Fri-- Given @ Dialysis 01/04/20   [provider]  doxycycline (VIBRAMYCIN) 100 MG capsule Take 1 capsule (100 mg total) by mouth 2 (two) times daily. Patient not taking: Reported on 01/13/2022 11/28/21   Quintella Reichert, MD  folic acid-vitamin b complex-vitamin c-selenium-zinc (DIALYVITE) 3 MG TABS tablet Take 1 tablet by mouth daily.    [provider]  losartan (COZAAR) 50 MG tablet Take 50 mg by mouth daily. Patient not taking: Reported on 01/13/2022 11/03/21   [provider]  ondansetron (ZOFRAN) 8 MG tablet Take 8 mg by mouth 2 (two) times daily as needed for nausea or vomiting.     [provider]  pantoprazole (PROTONIX) 40 MG tablet Take 1 tablet (40 mg total) by mouth daily. 08/18/21   Nita Sells, MD  zolpidem (AMBIEN) 10 MG tablet Take 10 mg by mouth at bedtime as needed for sleep. Patient not taking: Reported on 01/13/2022 02/13/21   [provider]      Allergies    Sulfa antibiotics and Adhesive [tape]    Review of Systems   Review of Systems  Constitutional:  Negative for fever.  Respiratory:  Positive for shortness of breath.   Cardiovascular:  Negative for chest pain and  palpitations.  Gastrointestinal:  Positive for abdominal pain and diarrhea. Negative for nausea and vomiting.  All other systems reviewed and are negative.  Physical Exam Updated Vital Signs BP 107/72 (BP Location: Left Arm)    Pulse 95    Temp 97.6 F (36.4 C) (Oral)    Resp 16    SpO2 100%  Physical Exam Vitals and nursing note reviewed.  Constitutional:      General: She is not in acute distress.    Appearance: Normal appearance. She is well-developed. She is not ill-appearing or toxic-appearing.  HENT:     Head:  Normocephalic and atraumatic.     Mouth/Throat:     Mouth: Mucous membranes are moist.     Pharynx: Oropharynx is clear.  Eyes:     General: No scleral icterus.    Extraocular Movements: Extraocular movements intact.     Pupils: Pupils are equal, round, and reactive to light.  Cardiovascular:     Rate and Rhythm: Normal rate and regular rhythm.     Heart sounds: Murmur heard.  Pulmonary:     Effort: Pulmonary effort is normal.     Breath sounds: Normal breath sounds.  Abdominal:     General: Abdomen is protuberant. Bowel sounds are normal. There is no distension.     Palpations: Abdomen is soft.     Tenderness: There is abdominal tenderness in the left lower quadrant. There is no right CVA tenderness, left CVA tenderness, guarding or rebound. Negative signs include Rovsing's sign and McBurney's sign.  Skin:    General: Skin is warm and dry.     Capillary Refill: Capillary refill takes less than 2 seconds.  Neurological:     General: No focal deficit present.     Mental Status: She is alert and oriented to person, place, and time.  Psychiatric:        Mood and Affect: Mood normal.        Behavior: Behavior normal.    ED Results / Procedures / Treatments   Labs (all labs ordered are listed, but only abnormal results are displayed) Labs Reviewed  LIPASE, BLOOD - Abnormal; Notable for the following components:      Result Value   Lipase 53 (*)    All other components within normal limits  COMPREHENSIVE METABOLIC PANEL - Abnormal; Notable for the following components:   Potassium 5.5 (*)    Chloride 94 (*)    BUN 71 (*)    Creatinine, Ser 7.70 (*)    Albumin 3.2 (*)    Alkaline Phosphatase 173 (*)    GFR, Estimated 5 (*)    Anion gap 18 (*)    All other components within normal limits  CBC - Abnormal; Notable for the following components:   RBC 3.68 (*)    Hemoglobin 11.2 (*)    HCT 34.5 (*)    RDW 18.2 (*)    All other components within normal limits  I-STAT CHEM 8,  ED - Abnormal; Notable for the following components:   Potassium 5.4 (*)    Chloride 96 (*)    BUN 67 (*)    Creatinine, Ser 8.60 (*)    Calcium, Ion 0.98 (*)    Hemoglobin 11.9 (*)    HCT 35.0 (*)    All other components within normal limits  TROPONIN I (HIGH SENSITIVITY) - Abnormal; Notable for the following components:   Troponin I (High Sensitivity) 23 (*)    All other components within normal limits  URINALYSIS, ROUTINE W REFLEX MICROSCOPIC  TROPONIN I (HIGH SENSITIVITY)    EKG EKG Interpretation  Date/Time:  Thursday January 22 2022 13:26:42 EST Ventricular Rate:  101 PR Interval:  186 QRS Duration: 107 QT Interval:  375 QTC Calculation: 487 R Axis:   96 Text Interpretation: Sinus tachycardia Ventricular premature complex Right axis deviation Nonspecific T abnormalities, lateral leads Borderline prolonged QT interval When compared to prior, similar appearance. No STEMI Confirmed by Antony Blackbird [phone]) on 01/22/2022 2:20:46 PM  Radiology DG Chest 2 View  Result Date: 01/21/2022 CLINICAL DATA:  Shortness of breath EXAM: CHEST - 2 VIEW COMPARISON:  01/11/2022 FINDINGS: Cardiomegaly. Thickened central vessels with cephalized flow. Patchy reticular opacity correlating with scattered microlithiasis and scar-like appearance on 2020 CT. Large lung volumes with diaphragm flattening. There is no edema, consolidation, effusion, or pneumothorax. IMPRESSION: 1. Chronic cardiomegaly and vascular congestion. 2. Pulmonary scarring. Electronically Signed   By: Jorje Guild M.D.   On: 01/21/2022 05:38   DG Abdomen Acute W/Chest  Result Date: 01/22/2022 CLINICAL DATA:  Chest and abdominal pain EXAM: DG ABDOMEN ACUTE WITH 1 VIEW CHEST COMPARISON:  Radiograph dated December 19, 2012 FINDINGS: Nonobstructive bowel gas pattern. In aortoiliac vascular calcifications. Visualized lung bases demonstrate bibasilar atelectasis and cardiomegaly. No acute osseous abnormality. IMPRESSION: Nonobstructive  bowel gas pattern. Electronically Signed   By: Yetta Glassman M.D.   On: 01/22/2022 09:03    Procedures Procedures    Medications Ordered in ED Medications  sodium zirconium cyclosilicate (LOKELMA) packet 10 g (has no administration in time range)    ED Course/ Medical Decision Making/ A&P                           Medical Decision Making Amount and/or Complexity of Data Reviewed Radiology: ordered.  Risk Prescription drug management. Decision regarding hospitalization.   Patient presents to the ED with complaints of abdominal pain. This involves an extensive number of treatment options, and is a complaint that carries with it a high risk of complications and morbidity.   Additional history obtained:  Additional history obtained from: n/a External records from outside source obtained and reviewed including: previous ED visits   EKG: EKG: sinus tachycardia.   Cardiac Monitoring: The patient was maintained on a cardiac monitor.  I personally viewed and interpreted the cardiac monitored which showed an underlying rhythm of: sinus rhythm   Lab Results: I personally ordered, reviewed, and interpreted labs. Pertinent results include: Most recent I-Stat: K+ 5.8, Creatinine 8.4 Lipase 53 Troponin 19, 23, delta +4   Imaging Studies ordered:  I ordered imaging studies which included x-ray.  I independently reviewed & interpreted imaging & am in agreement with radiology impression. Imaging shows: Chest X-ray with cardiomegaly, vascular congestion  Abdomen acute X-ray: non-obstructive bowel gas pattern   Medications  I ordered medication including, Lokelma for hyperkalemia  Reevaluation of the patient after medication shows that patient  n/a  Tests Considered: CT A/P without contrast   Critical Interventions: Lokelma for hyperkalemia   Consultations: I requested consultation with the neprhology team, Leron Croak, PA-C,  and discussed lab and imaging findings as well as  pertinent plan - they recommend: medicine admission for dialysis tomorrow   Spoke with Dr. Roosevelt Locks, hospitalist, at 1430 who accepts patient for admission at this time.   ED Course: 71 year old female who presents to the emergency department with complaint of abdominal pain.  Her abdominal exam is without peritoneal signs.  There is  no evidence of an acute abdomen.  She is well-appearing.  Given her work-up so far I have low suspicion for acute hepatobiliary disease including acute cholecystitis or cholangitis.  Lipase is 53 however doubt acute pancreatitis given her clinical presentation.  Symptoms are not consistent with peptic ulcer disease.  Chest x-ray without evidence of pneumonia, labs without evidence of hepatitis.  And uric, doubt UTI or pyelonephritis.  Would be very atypical for acute appendicitis.  Symptoms not consistent with vascular catastrophe, bowel obstruction, viscus perforation. She does have a history of diverticulosis and complains of left lower quadrant pain that is similar to diverticulitis pain.  However patient is afebrile, no leukocytosis on exam.  She believes that it is similar to her diverticulosis symptoms because she had an episode of diarrhea.  Question whether her diarrhea is related to her electrolyte abnormalities related to missed dialysis.  We will monitor her clinically.  If she has change in clinical status will consider CT imaging.  Regarding her dialysis, last dialysis was Monday.  She received full course of dialysis.  She missed dialysis on Wednesday.  She was here in the emergency department with complaint of shortness of breath.  She was discharged with instructions to follow-up for dialysis today, however she had abdominal pain this morning around 5 AM and so presented to the emergency department and missed her dialysis appointment.  Yesterday she was not having electrolyte abnormality, however today she has hyperkalemia up to 5.8, worsening creatinine to 8.4.   She does appear to have some fluid overload on exam and on chest x-ray.  Concerned that she will not be able to follow-up tomorrow for dialysis for her typical Friday.  Discussed with Delton Coombes with nephrology who states they will be able to dialyze her today however she can be admitted to medicine and have dialysis tomorrow.  Spoke with Dr. Roosevelt Locks, hospitalist who accept the patient for admission.  I have given her Lokelma in the meantime to temporize her potassium.  We will continue to monitor her, she will be admitted to medicine.  After consideration of the diagnostic results and the patients response to treatment, I feel that the patent would benefit from admission. The patient has been appropriately medically screened and/or stabilized in the ED. I have low suspicion for any other emergent medical condition which would require further screening, evaluation or treatment in the ED or require inpatient management. Final Clinical Impression(s) / ED Diagnoses Final diagnoses:  Abdominal pain    Rx / DC Orders ED Discharge Orders     None         Mickie Hillier, PA-C 01/22/22 1441    Mickie Hillier, PA-C 01/22/22 1443    Tegeler, Gwenyth Allegra, MD 01/22/22 1520

## 2022-01-22 NOTE — ED Notes (Signed)
Pt had episode of diarrhea while waiting. Patient requests immodium, PA in triage to speak with patient.

## 2022-01-22 NOTE — ED Triage Notes (Addendum)
Pt arrived by [facility] from home for lower abd pain since 0500 that woke her up. Dialysis patient that is due for treatment today, last treatment was on Friday. Patient has nausea, no vomiting or diarrhea.

## 2022-01-22 NOTE — ED Notes (Signed)
Dialysis pt cant produce urine per pt

## 2022-01-22 NOTE — Progress Notes (Signed)
Contacted [facility] this am per ED staff request from pt's ED visit yesterday. [place] is unable to provide treatment to pt today but can provide pt's regular scheduled treatment tomorrow. Update provided to nephrology MD and PA this am.   Melven Sartorius Renal Navigator [phone]

## 2022-01-22 NOTE — ED Provider Triage Note (Signed)
Emergency Medicine Provider Triage Evaluation Note  [name] , a 71 y.o. female  was evaluated in triage.  Pt complains of CP/SOB CP started this morning around 5am. Sternal non radiating.   Seems more SOB x1 day. Dialysis MWF last dialysis was ?Friday last week.  Review of Systems  Positive: CP, SOB Negative: Fever   Physical Exam  BP (!) 159/95 (BP Location: Right Leg)    Pulse 95    Temp (!) 97.5 F (36.4 C) (Oral)    Resp 17    SpO2 (!) 72%  Gen:   Awake, no distress   Resp:  Normal effort  MSK:   Moves extremities without difficulty   Medical Decision Making  Medically screening exam initiated at 8:04 AM.  Appropriate orders placed.  [name] was informed that the remainder of the evaluation will be completed by another provider, this initial triage assessment does not replace that evaluation, and the importance of remaining in the ED until their evaluation is complete.  Concern for hyperkalemia. Will obtain EKG, istat, other labs    [name], [place] 01/22/22 [phone]

## 2022-01-23 ENCOUNTER — Observation Stay (HOSPITAL_COMMUNITY): Payer: Medicare Other

## 2022-01-23 ENCOUNTER — Other Ambulatory Visit: Payer: Self-pay

## 2022-01-23 DIAGNOSIS — K5792 Diverticulitis of intestine, part unspecified, without perforation or abscess without bleeding: Secondary | ICD-10-CM | POA: Diagnosis not present

## 2022-01-23 DIAGNOSIS — W19XXXA Unspecified fall, initial encounter: Secondary | ICD-10-CM | POA: Insufficient documentation

## 2022-01-23 DIAGNOSIS — Y92009 Unspecified place in unspecified non-institutional (private) residence as the place of occurrence of the external cause: Secondary | ICD-10-CM | POA: Insufficient documentation

## 2022-01-23 DIAGNOSIS — R109 Unspecified abdominal pain: Secondary | ICD-10-CM | POA: Diagnosis not present

## 2022-01-23 DIAGNOSIS — I5032 Chronic diastolic (congestive) heart failure: Secondary | ICD-10-CM | POA: Diagnosis present

## 2022-01-23 LAB — CBC
HCT: 34.1 % — ABNORMAL LOW (ref 36.0–46.0)
HCT: 37.9 % (ref 36.0–46.0)
Hemoglobin: 11.2 g/dL — ABNORMAL LOW (ref 12.0–15.0)
Hemoglobin: 12 g/dL (ref 12.0–15.0)
MCH: 30.4 pg (ref 26.0–34.0)
MCH: 29.6 pg (ref 26.0–34.0)
MCHC: 32.8 g/dL (ref 30.0–36.0)
MCHC: 31.7 g/dL (ref 30.0–36.0)
MCV: 92.7 fL (ref 80.0–100.0)
MCV: 93.6 fL (ref 80.0–100.0)
Platelets: 188 10*3/uL (ref 150–400)
Platelets: 195 10*3/uL (ref 150–400)
RBC: 3.68 MIL/uL — ABNORMAL LOW (ref 3.87–5.11)
RBC: 4.05 MIL/uL (ref 3.87–5.11)
RDW: 18.2 % — ABNORMAL HIGH (ref 11.5–15.5)
RDW: 18.5 % — ABNORMAL HIGH (ref 11.5–15.5)
WBC: 5.7 10*3/uL (ref 4.0–10.5)
WBC: 5.4 10*3/uL (ref 4.0–10.5)
nRBC: 0 % (ref 0.0–0.2)
nRBC: 0 % (ref 0.0–0.2)

## 2022-01-23 LAB — POTASSIUM
Potassium: 5.4 mmol/L — ABNORMAL HIGH (ref 3.5–5.1)
Potassium: 3.9 mmol/L (ref 3.5–5.1)
Potassium: 4.2 mmol/L (ref 3.5–5.1)
Potassium: 3.9 mmol/L (ref 3.5–5.1)

## 2022-01-23 LAB — BASIC METABOLIC PANEL
Anion gap: 19 — ABNORMAL HIGH (ref 5–15)
BUN: 81 mg/dL — ABNORMAL HIGH (ref 8–23)
CO2: 24 mmol/L (ref 22–32)
Calcium: 8.9 mg/dL (ref 8.9–10.3)
Chloride: 95 mmol/L — ABNORMAL LOW (ref 98–111)
Creatinine, Ser: 8.55 mg/dL — ABNORMAL HIGH (ref 0.44–1.00)
GFR, Estimated: 5 mL/min — ABNORMAL LOW (ref >60)
Glucose, Bld: 73 mg/dL (ref 70–99)
Potassium: 4.6 mmol/L (ref 3.5–5.1)
Sodium: 138 mmol/L (ref 135–145)

## 2022-01-23 LAB — HEPATITIS B SURFACE ANTIGEN: Hepatitis B Surface Ag: NONREACTIVE

## 2022-01-23 LAB — HEPATITIS B SURFACE ANTIBODY,QUALITATIVE: Hep B S Ab: REACTIVE — AB

## 2022-01-23 LAB — CBG MONITORING, ED: Glucose-Capillary: 117 mg/dL — ABNORMAL HIGH (ref 70–99)

## 2022-01-23 MED ORDER — LIDOCAINE HCL (PF) 1 % IJ SOLN: 5.0000 mL | INTRAMUSCULAR | Status: DC | PRN

## 2022-01-23 MED ORDER — PIPERACILLIN-TAZOBACTAM IN DEX 2-0.25 GM/50ML IV SOLN
2.2500 g | Freq: Three times a day (TID) | INTRAVENOUS | Status: DC
  Filled 2022-01-23 (×2): qty 50

## 2022-01-23 MED ORDER — ALTEPLASE 2 MG IJ SOLR: 2.0000 mg | Freq: Once | INTRAMUSCULAR | Status: DC | PRN

## 2022-01-23 MED ORDER — SODIUM CHLORIDE 0.9 % IV SOLN: 100.0000 mL | INTRAVENOUS | Status: DC | PRN

## 2022-01-23 MED ORDER — HEPARIN SODIUM (PORCINE) 1000 UNIT/ML DIALYSIS: 1000.0000 [IU] | INTRAMUSCULAR | Status: DC | PRN

## 2022-01-23 MED ORDER — PIPERACILLIN-TAZOBACTAM 3.375 G IVPB
3.3750 g | Freq: Three times a day (TID) | INTRAVENOUS | Status: DC
Start: 2022-01-23 — End: 2022-01-23

## 2022-01-23 MED ORDER — PENTAFLUOROPROP-TETRAFLUOROETH EX AERO: 1.0000 "application " | INHALATION_SPRAY | CUTANEOUS | Status: DC | PRN

## 2022-01-23 MED ORDER — PIPERACILLIN-TAZOBACTAM IN DEX 2-0.25 GM/50ML IV SOLN
2.2500 g | Freq: Three times a day (TID) | INTRAVENOUS | Status: DC
  Administered 2022-01-23 – 2022-01-24 (×3): 2.25 g via INTRAVENOUS
  Filled 2022-01-23 (×3): qty 50

## 2022-01-23 MED ORDER — LIDOCAINE-PRILOCAINE 2.5-2.5 % EX CREA: 1.0000 "application " | TOPICAL_CREAM | CUTANEOUS | Status: DC | PRN

## 2022-01-23 MED ORDER — IOHEXOL 300 MG/ML  SOLN
100.0000 mL | Freq: Once | INTRAMUSCULAR | Status: AC | PRN
  Administered 2022-01-23: 100 mL via INTRAVENOUS

## 2022-01-23 NOTE — Assessment & Plan Note (Signed)
Patient appears euvolemic. Monitor volume status carefully.

## 2022-01-23 NOTE — Progress Notes (Addendum)
PHARMACY NOTE:  ANTIMICROBIAL RENAL DOSAGE ADJUSTMENT  Current antimicrobial regimen includes a mismatch between antimicrobial dosage and estimated renal function.  As per policy approved by the Pharmacy & Therapeutics and Medical Executive Committees, the antimicrobial dosage will be adjusted accordingly.  Current antimicrobial dosage:  Zosyn 3.375gm IV q8 hr. Augmentin started earlier today.  Indication: Presumed diverticulitis  Renal Function:  Estimated Creatinine Clearance: 4.7 mL/min (A) (by C-G formula based on SCr of 8.55 mg/dL (H)). [x]      On intermittent HD, scheduled: MWF []      On CRRT    Antimicrobial dosage has been changed to:  Zosyn 2.25gm IV q8hr  Additional comments: DC Augmentin while on Zosyn.   Thank you for allowing pharmacy to be a part of this patient's care.  Lorenso Courier Junior, [facility] 01/23/2022 5:57 PM

## 2022-01-23 NOTE — Assessment & Plan Note (Signed)
Due to missing HD on 01/21/2022. Lokelma given. Will monitor after HD for correction.

## 2022-01-23 NOTE — Procedures (Signed)
° °  I was present at this dialysis session, have reviewed the session itself and made  appropriate changes Kelly Splinter MD [facility] pager [phone]   01/23/2022, 4:20 PM

## 2022-01-23 NOTE — Progress Notes (Signed)
Pharmacy Antibiotic Note  [name] is a 71 y.o. female with ESRD ON HD MWF and hx of diverticulitis with perforation who was admitted on 01/22/2022 with worsening abdominal pain and diarrhea.  Pharmacy has been consulted for Zosyn dosing.  WBC 5.4, afebrile;   Plan: Zosyn 2.25 gm IV Q 8 hrs Monitor WBC, temp, clinical course  Weight: 49.4 kg (108 lb 14.5 oz)  Temp (24hrs), Avg:97.7 F (36.5 C), Min:97.3 F (36.3 C), Max:98 F (36.7 C)  Recent Labs  Lab 01/21/22 0522 01/22/22 0815 01/22/22 0818 01/22/22 1356 01/23/22 0236 01/23/22 1512  WBC 5.8  --  6.4  --  5.7 5.4  CREATININE 6.00* 8.60* 7.70* 8.40* 8.55*  --     Estimated Creatinine Clearance: 4.7 mL/min (A) (by C-G formula based on SCr of 8.55 mg/dL (H)).    Allergies  Allergen Reactions   Sulfa Antibiotics Other (See Comments)    Per patient, both parents allergic-so will not take   Adhesive [Tape] Itching    Antimicrobials this admission: Zosyn 2/10 >>  Microbiology results: 2/9 COVID, flu A, flu B: negative 2/10 HBV sAb: positive; HBV S Ag: negative  Thank you for allowing pharmacy to be a part of this patients care.  Gillermina Hu, PharmD, BCPS, [facility] Clinical Pharmacist 01/23/2022 6:05 PM

## 2022-01-23 NOTE — ED Notes (Signed)
Dialysis called at [phone], no answer

## 2022-01-23 NOTE — Assessment & Plan Note (Signed)
Pt with pain in left lower quadrant, no fever, no leukocytosis. Will place the patient on zosyn and check CT abdomen and pelvis.

## 2022-01-23 NOTE — Progress Notes (Signed)
PROGRESS NOTE  Kathryn ROLOFF EXB:[id number] DOB: [birth year] DOA: 01/22/2022 PCP: Renford Dills, MD  Brief History   [name] is a 71 y.o. female with medical history significant of ESRD on HD, MWF, chronic diastolic CHF, HTN, HLD, anemia of chronic disease, presented with worsening of abdominal pain and diarrhea.   Patient started to have a cramping-like left lower quadrant abdominal pain since yesterday morning, intermittent.  She came to ED yesterday morning, asking for dialysis, and was reassured and sent home.  She denies any chest pain, no cough no fever or chills.  As she was in the ED whole day yesterday, she missed yesterday's dialysis.  Overnight, she had a loose bowel movement nonbloody, and she woke up this morning with worsening of cramp-like abdominal pain same location, then she passed another loose bowel movement in the ED.  She had diverticulitis last year and she felt it was the exact symptoms she had diverticulitis and last time.   ED Course: Afebrile, no tachycardia noted hypotension.  WBC 6.2, BUN 67, creatinine 8.6, K5.4.   Nephrology contacted and 1 dose of Lokelma ordered by ED. The patient was taken to dialysis today. She will be started on Zosyn. Will check CT abdomen and pelvis as this patient has a history of perforation with diverticulitis.   Consultants  Nephrology  Procedures  None  Antibiotics   Anti-infectives (From admission, onward)    Start     Dose/Rate Route Frequency Ordered Stop   01/23/22 2200  piperacillin-tazobactam (ZOSYN) IVPB 3.375 g        3.375 g 12.5 mL/hr over 240 Minutes Intravenous Every 8 hours 01/23/22 1748     01/22/22 1500  amoxicillin-clavulanate (AUGMENTIN) 250-125 MG per tablet 1 tablet        1 tablet Oral Daily 01/22/22 1459        Subjective  The patient is resting on the gurney, but she is now complaing of pain that is more periumbilical.  Objective   Vitals:  Vitals:   01/23/22 1130 01/23/22 1535  BP:  (!) 112/56 98/60  Pulse: 81 92  Resp: 17 20  Temp:  98 F (36.7 C)  SpO2: 90% 92%    Exam:  Constitutional:  The patient is awake, alert, and oriented x 3. No acute distress. Respiratory:  No increased work of breathing. No wheezes, rales, or rhonchi No tactile fremitus Cardiovascular:  Regular rate and rhythm No murmurs, ectopy, or gallups. No lateral PMI. No thrills. Abdomen:  Abdomen is soft,  non-distended Positive for periumbilical pain No hernias, masses, or organomegaly Normoactive bowel sounds.  Musculoskeletal:  No cyanosis, clubbing, or edema Skin:  No rashes, lesions, ulcers palpation of skin: no induration or nodules Neurologic:  CN 2-12 intact Sensation all 4 extremities intact Psychiatric:  Mental status Mood, affect appropriate Orientation to person, place, time  judgment and insight appear intact  I have personally reviewed the following:   Today's Data  vitals  Lab Data  BMP Potassium CBC  Micro Data  Will collect blood cultures x 2 as the patient will be started on IV Zosyn.  Imaging  CT abdomen and pelvis with contrast is pending.  Cardiology Data  EKG  Other Data    Scheduled Meds:  acidophilus  2 capsule Oral TID   allopurinol  300 mg Oral Daily   amoxicillin-clavulanate  1 tablet Oral Daily   aspirin EC  81 mg Oral QHS   atorvastatin  40 mg Oral Daily  Chlorhexidine Gluconate Cloth  6 each Topical Q0600   cinacalcet  30 mg Oral Once per day on Mon Wed Fri   feeding supplement  237 mL Oral BID BM   heparin  5,000 Units Subcutaneous Q12H   metoprolol tartrate  12.5 mg Oral BID   pantoprazole  40 mg Oral Daily   Continuous Infusions:  Principal Problem:   Diverticulitis Active Problems:   End-stage renal disease on hemodialysis (HCC)   Hyperkalemia   Chronic diastolic CHF (congestive heart failure) (HCC)   LOS: 0 days    A & P  Assessment and Plan: * Diverticulitis- (present on admission) Pt with pain in left  lower quadrant, no fever, no leukocytosis. Will place the patient on zosyn and check CT abdomen and pelvis.   Chronic diastolic CHF (congestive heart failure) (HCC)- (present on admission) Patient appears euvolemic. Monitor volume status carefully.  Hyperkalemia- (present on admission) Due to missing HD on 01/21/2022. Lokelma given. Will monitor after HD for correction.  End-stage renal disease on hemodialysis [facility]) Nephrology consulted. The patient was taken for HD today as she missed her dialysis on 01/21/2022. I appreciate their assistance.   I have seen and examined this patient myself. I have spent 32 minutes in her evaluation and care.  DVT prophylaxis: Hepaarin Code Status: Full Code Family Communication: Daughter at bedside Disposition Plan: Anticipate discharge to home.    Senica Crall, DO [facility] Direct contact: see [email or web address]  7PM-7AM contact night coverage as above 01/23/2022, 5:55 PM  LOS: 0 days

## 2022-01-23 NOTE — Assessment & Plan Note (Deleted)
Present on admission due to missing dialysis. Monitor for correction after HD.

## 2022-01-23 NOTE — Assessment & Plan Note (Signed)
Nephrology consulted. The patient was taken for HD today as she missed her dialysis on 01/21/2022. I appreciate their assistance.

## 2022-01-23 NOTE — Consult Note (Signed)
[facility] Renal Consultation Note    Indication for Consultation:  Management of ESRD/hemodialysis; anemia, hypertension/volume and secondary hyperparathyroidism   HPI: [name] is a 71 y.o. female  with ESRD on  HD MWF at [place] presented to the ED due to abdominal pain and diarrhea.  Seen in the ED yesterday due to shortness of breath and increased volume. Patient typically compliant with prescribed dialysis regimen.  Completed full HD on 01/19/22 and missed yesterday due to being in ED. Seen and examined in the ED.  Admits to shortness of breath most days around this same.  CXR noted mild interstitial edema. Oxygenating 100% on RA with normal work of breathing.  Pertinent labs include K 5.8, BUN 78, SCr 8.4. Admitted under observation for suspected diverticulitis. Hyperkalemia noted last night with K  7.2, temporizing meds given. Urgent dialysis this am.   Past Medical History:  Diagnosis Date   Adenomatous polyp of colon 10/2010, 2006, 2015   Anemia in CKD (chronic kidney disease) 11/07/2012   s/p blood transfusion.    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  Had some early rejection then stable function for years, then had slow decline of function and went back on hemodialysis in 2012.  Gets HD TTS schedule at [facility] on [facility] still using L forearm AVF.      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[place])    left upper arm    Pulmonary edema 12/2019   Stroke ([place]) 11/2015   TIA   Weight loss, unintentional    Past Surgical History:  Procedure Laterality Date   AV FISTULA PLACEMENT     for dialysis   AV FISTULA PLACEMENT Left 11/22/2015   Procedure: ARTERIOVENOUS (AV) FISTULA CREATION-LEFT BRACHIOCEPHALIC;  Surgeon: Serafina Mitchell, MD;  Location: [place];  Service: Vascular;  Laterality: Left;   AV FISTULA PLACEMENT Right 03/15/2020   Procedure: INSERTION OF ARTERIOVENOUS (AV) GORE-TEX GRAFT ARM ( BRACHIAL AXILLARY );  Surgeon: Katha Cabal, MD;  Location: [facility];  Service: Vascular;  Laterality: Right;   BACK SURGERY     CERVICAL FUSION     CHOLECYSTECTOMY  12/02/2012   Procedure: LAPAROSCOPIC CHOLECYSTECTOMY WITH INTRAOPERATIVE CHOLANGIOGRAM;  Surgeon: Edward Jolly, MD;  Location: [place];  Service: General;  Laterality: N/A;   EYE SURGERY Bilateral    cataract surgery   EYE SURGERY Left 2019   laser   HEMATOMA EVACUATION Left 12/24/2016   Procedure: EVACUATION HEMATOMA LEFT UPPER ARM;  Surgeon: Waynetta Sandy, MD;  Location: [place];  Service: Vascular;  Laterality: Left;   I & D EXTREMITY Left 12/31/2016   Procedure: IRRIGATION AND DEBRIDEMENT EXTREMITY;  Surgeon: Angelia Mould, MD;  Location: [place];  Service: Vascular;  Laterality: Left;   INSERTION OF DIALYSIS CATHETER Right 12/24/2016   Procedure:  INSERTION OF DIALYSIS CATHETER;  Surgeon: Waynetta Sandy, MD;  Location: [place];  Service: Vascular;  Laterality: Right;   INSERTION OF DIALYSIS CATHETER Right 02/04/2017   Procedure: INSERTION OF DIALYSIS CATHETER;  Surgeon: Waynetta Sandy, MD;  Location: [place];  Service: Vascular;  Laterality: Right;   [place] Left 10/23/2016   Procedure: Fistulagram;  Surgeon: Elam Dutch, MD;  Location: [place] [facility];  Service: Cardiovascular;  Laterality: Left;   PERIPHERAL VASCULAR THROMBECTOMY Right 04/16/2020    Procedure: PERIPHERAL VASCULAR THROMBECTOMY;  Surgeon: Katha Cabal, MD;  Location: [place] [facility];  Service: Cardiovascular;  Laterality: Right;   RESECTION OF ARTERIOVENOUS FISTULA ANEURYSM Left 11/22/2015   Procedure: RESECTION OF LEFT RADIOCEPHALIC FISTULA ANEURYSM ;  Surgeon: Serafina Mitchell, MD;  Location: [place] [facility];  Service: Vascular;  Laterality: Left;   REVISON OF ARTERIOVENOUS FISTULA Left 12/22/2016   Procedure: REVISON OF LEFT ARTERIOVENOUS FISTULA;  Surgeon: Waynetta Sandy, MD;  Location: [place];  Service: Vascular;  Laterality: Left;   REVISON OF ARTERIOVENOUS FISTULA Left 02/04/2017   Procedure: REVISON OF LEFT UPPER ARM ARTERIOVENOUS FISTULA;  Surgeon: Waynetta Sandy, MD;  Location: [place];  Service: Vascular;  Laterality: Left;   UPPER EXTREMITY ANGIOGRAPHY Bilateral 09/19/2019   Procedure: UPPER EXTREMITY ANGIOGRAPHY;  Surgeon: Katha Cabal, MD;  Location: [place] [facility];  Service: Cardiovascular;  Laterality: Bilateral;   Family History  Problem Relation Age of Onset   Colon cancer Brother    Cancer Brother    Coronary artery disease Mother 16   Hyperlipidemia Mother    Hypertension Mother    Stroke Maternal Aunt    Esophageal cancer Neg Hx    Stomach cancer Neg Hx    Rectal cancer Neg Hx    Social History:  reports that she quit smoking about 30 years ago. Her smoking use included cigarettes. She has never used smokeless tobacco. She reports that she does not drink alcohol and does not use drugs. Allergies  Allergen Reactions   Sulfa Antibiotics Other (See Comments)    Per patient, both parents allergic-so will not take   Adhesive [Tape] Itching   Prior to Admission medications   Medication Sig Start Date End Date Taking? Authorizing Provider  ALPRAZolam (XANAX) 0.25 MG tablet Take 1 tablet (0.25 mg total) by mouth daily as needed for anxiety. 08/18/21  Yes Nita Sells, MD  aspirin EC 81 MG tablet Take 81 mg by mouth at  bedtime.   Yes [provider]  acetaminophen (TYLENOL) 500 MG tablet Take 1 tablet (500 mg total) by mouth every 6 (six) hours as needed. 09/05/18   Law, Bea Graff, PA-C  albuterol (VENTOLIN HFA) 108 (90 Base) MCG/ACT inhaler Inhale 1-2 puffs into the lungs every 6 (six) hours as needed for wheezing or shortness of breath. 01/11/22   Mesner, Corene Cornea, MD  allopurinol (ZYLOPRIM) 300 MG tablet Take 300 mg by mouth daily. 11/03/21   [provider]  atorvastatin (LIPITOR) 40 MG tablet Take 1 tablet (40 mg total) by mouth daily. 01/13/22   Burnell Blanks, MD  cinacalcet (SENSIPAR) 30 MG tablet Take 30 mg by mouth 3 (three) times a week. Mon, Wed, Fri-- Given @ Dialysis 01/04/20   [provider]  folic acid-vitamin b complex-vitamin c-selenium-zinc (DIALYVITE) 3 MG TABS tablet Take 1 tablet by mouth daily.    [provider]  losartan (COZAAR) 50 MG tablet Take  50 mg by mouth daily. 11/03/21   [provider]  ondansetron (ZOFRAN) 8 MG tablet Take 8 mg by mouth 2 (two) times daily as needed for nausea or vomiting.     [provider]  pantoprazole (PROTONIX) 40 MG tablet Take 1 tablet (40 mg total) by mouth daily. 08/18/21   Nita Sells, MD  zolpidem (AMBIEN) 10 MG tablet Take 10 mg by mouth at bedtime as needed for sleep. 02/13/21   [provider]   Current Facility-Administered Medications  Medication Dose Route Frequency Provider Last Rate Last Admin   0.9 %  sodium chloride infusion  100 mL Intravenous PRN Penninger, Ria Comment, PA       0.9 %  sodium chloride infusion  100 mL Intravenous PRN Penninger, Ria Comment, PA       acetaminophen (TYLENOL) tablet 500 mg  500 mg Oral Q6H PRN Wynetta Fines T, MD       acidophilus (RISAQUAD) capsule 2 capsule  2 capsule Oral TID Wynetta Fines T, MD   2 capsule at 01/22/22 2223   albuterol (PROVENTIL) (2.5 MG/3ML) 0.083% nebulizer solution 3 mL  3 mL Inhalation Q6H PRN Wynetta Fines T, MD        allopurinol (ZYLOPRIM) tablet 300 mg  300 mg Oral Daily Wynetta Fines T, MD   300 mg at 01/22/22 1752   ALPRAZolam Duanne Moron) tablet 0.25 mg  0.25 mg Oral Daily PRN Wynetta Fines T, MD       alteplase (CATHFLO ACTIVASE) injection 2 mg  2 mg Intracatheter Once PRN Penninger, Ria Comment, PA       amoxicillin-clavulanate (AUGMENTIN) 250-125 MG per tablet 1 tablet  1 tablet Oral Daily Wynetta Fines T, MD   1 tablet at 01/22/22 1753   aspirin EC tablet 81 mg  81 mg Oral QHS Lequita Halt, MD   81 mg at 01/22/22 2221   atorvastatin (LIPITOR) tablet 40 mg  40 mg Oral Daily Wynetta Fines T, MD   40 mg at 01/22/22 1752   Chlorhexidine Gluconate Cloth 2 % PADS 6 each  6 each Topical Q0600 Penninger, Ria Comment, PA       cinacalcet (SENSIPAR) tablet 30 mg  30 mg Oral Once per day on Mon Wed Fri Zhang, Ping T, MD       feeding supplement (ENSURE ENLIVE / ENSURE PLUS) liquid 237 mL  237 mL Oral BID BM Wynetta Fines T, MD   237 mL at 01/22/22 1750   heparin injection 1,000 Units  1,000 Units Dialysis PRN Penninger, Ria Comment, PA       heparin injection 5,000 Units  5,000 Units Subcutaneous Q12H Wynetta Fines T, MD   5,000 Units at 01/22/22 2221   HYDROcodone-acetaminophen (NORCO/VICODIN) 5-325 MG per tablet 1-2 tablet  1-2 tablet Oral Q4H PRN Wynetta Fines T, MD       lidocaine (PF) (XYLOCAINE) 1 % injection 5 mL  5 mL Intradermal PRN Penninger, Ria Comment, PA       lidocaine-prilocaine (EMLA) cream 1 application  1 application Topical PRN Penninger, Ria Comment, PA       metoprolol tartrate (LOPRESSOR) tablet 12.5 mg  12.5 mg Oral BID Wynetta Fines T, MD   12.5 mg at 01/22/22 1750   pantoprazole (PROTONIX) EC tablet 40 mg  40 mg Oral Daily Wynetta Fines T, MD   40 mg at 01/22/22 1751   pentafluoroprop-tetrafluoroeth (GEBAUERS) aerosol 1 application  1 application Topical PRN Penninger, Ria Comment, PA       zolpidem (AMBIEN) tablet 5  mg  5 mg Oral QHS PRN Wynetta Fines T, MD   5 mg at 01/22/22 1957     ROS: As per HPI otherwise negative.  Physical  Exam: Vitals:   01/23/22 1000 01/23/22 1030 01/23/22 1100 01/23/22 1130  BP: (!) 129/41 (!) 156/56 98/66 (!) 112/56  Pulse: 84 84 81 81  Resp: 14 18 15 17   Temp:      TempSrc:      SpO2: 97% 100% 98% 90%  Weight:         General: Appears comfortable, in no distress  Head: NCAT sclera not icteric MMM Neck: Supple. No JVD appreciated  Lungs: Clear bilaterally without wheezes, rales, or rhonchi. Normal WOB  Heart: RRR, no murmur, rub, or gallop  Abdomen: soft non-tender, bowel sounds normal, no masses  Lower extremities:without edema or ischemic changes, no open wounds  Neuro: A & O X 3. Moves all extremities spontaneously. Psych:  Responds to questions appropriately with a normal affect. Dialysis Access: L AVG in use   Labs: Basic Metabolic Panel: Recent Labs  Lab 01/21/22 0522 01/22/22 0815 01/22/22 0818 01/22/22 1356 01/22/22 2201 01/23/22 0236 01/23/22 0826  NA 139   < > 137 134*  --  138  --   K 4.4   < > 5.5* 5.8* 7.2* 4.6 5.4*  CL 94*   < > 94* 97*  --  95*  --   CO2 31  --  25  --   --  24  --   GLUCOSE 83   < > 85 88  --  73  --   BUN 50*   < > 71* 78*  --  81*  --   CREATININE 6.00*   < > 7.70* 8.40*  --  8.55*  --   CALCIUM 8.8*  --  9.0  --   --  8.9  --    < > = values in this interval not displayed.   Liver Function Tests: Recent Labs  Lab 01/22/22 0818  AST 27  ALT 14  ALKPHOS 173*  BILITOT 0.7  PROT 7.4  ALBUMIN 3.2*   Recent Labs  Lab 01/22/22 0818  LIPASE 53*   No results for input(s): AMMONIA in the last 168 hours. CBC: Recent Labs  Lab 01/21/22 0522 01/22/22 0815 01/22/22 0818 01/22/22 1356 01/23/22 0236  WBC 5.8  --  6.4  --  5.7  NEUTROABS 3.1  --   --   --   --   HGB 10.9*   < > 11.2* 13.9 11.2*  HCT 33.2*   < > 34.5* 41.0 34.1*  MCV 93.3  --  93.8  --  92.7  PLT 184  --  187  --  188   < > = values in this interval not displayed.   Cardiac Enzymes: No results for input(s): CKTOTAL, CKMB, CKMBINDEX, TROPONINI in the last  168 hours. CBG: Recent Labs  Lab 01/23/22 0004  GLUCAP 117*   Iron Studies: No results for input(s): IRON, TIBC, TRANSFERRIN, FERRITIN in the last 72 hours. Studies/Results: DG CHEST PORT 1 VIEW  Result Date: 01/22/2022 CLINICAL DATA:  Cough, shortness of breath, and weakness. Nausea and abdominal pain. EXAM: PORTABLE CHEST 1 VIEW COMPARISON:  Chest x-ray from yesterday. FINDINGS: Unchanged cardiomegaly. Similar chronic pulmonary vascular congestion and mild interstitial thickening, most apparent at the left lung base. No focal consolidation, pleural effusion, or pneumothorax. No acute osseous abnormality. IMPRESSION: 1. Chronic cardiomegaly and mild pulmonary interstitial edema.  Electronically Signed   By: Titus Dubin M.D.   On: 01/22/2022 13:43   DG Abdomen Acute W/Chest  Result Date: 01/22/2022 CLINICAL DATA:  Chest and abdominal pain EXAM: DG ABDOMEN ACUTE WITH 1 VIEW CHEST COMPARISON:  Radiograph dated December 19, 2012 FINDINGS: Nonobstructive bowel gas pattern. In aortoiliac vascular calcifications. Visualized lung bases demonstrate bibasilar atelectasis and cardiomegaly. No acute osseous abnormality. IMPRESSION: Nonobstructive bowel gas pattern. Electronically Signed   By: Yetta Glassman M.D.   On: 01/22/2022 09:03    OP HD orders: [place] TTS 4hrs 350/600 46.5kg 2K 2Ca UFP2 AVG Sensipar 30mg  qHD Hect 4 Venofer 50 qwk    Assessment/Plan:  ESRD/Hyperkalemia  - HD TTS. Missed usual HD d/t hospital admission. Dialysis off schedule today to correct hyperkalemia. Back on schedule tomorrow.  Abdomnal pain -suspected diverticulitis -per primary   Hypertension/volume  - Blood pressure controlled. No gross volume on exam   Anemia  - Hgb stable. No ESA needs.   Metabolic bone disease -  Ca acceptable. Continue home binders when eating. /Hectorol with HD.   Nutrition -  On liquid diet. Needs renal appropriate liquids (low potassium)   Lynnda Child PA-C [facility] 01/23/2022, 11:51 AM

## 2022-01-24 DIAGNOSIS — K5792 Diverticulitis of intestine, part unspecified, without perforation or abscess without bleeding: Secondary | ICD-10-CM | POA: Diagnosis not present

## 2022-01-24 LAB — POTASSIUM
Potassium: 4.5 mmol/L (ref 3.5–5.1)
Potassium: 4.5 mmol/L (ref 3.5–5.1)

## 2022-01-24 LAB — HEPATITIS B SURFACE ANTIBODY, QUANTITATIVE: Hep B S AB Quant (Post): >1000 m[IU]/mL (ref >9.9)

## 2022-01-24 MED ORDER — DOCUSATE SODIUM 100 MG PO CAPS: 200.0000 mg | ORAL_CAPSULE | Freq: Once | ORAL | Status: DC

## 2022-01-24 MED ORDER — RISAQUAD PO CAPS: 2.0000 | ORAL_CAPSULE | Freq: Three times a day (TID) | ORAL | 0 refills | Status: DC

## 2022-01-24 MED ORDER — POLYETHYLENE GLYCOL 3350 17 G PO PACK: 17.0000 g | PACK | Freq: Two times a day (BID) | ORAL | Status: DC

## 2022-01-24 MED ORDER — POLYETHYLENE GLYCOL 3350 17 G PO PACK: 17.0000 g | PACK | Freq: Two times a day (BID) | ORAL | 0 refills | Status: DC

## 2022-01-24 MED ORDER — BISACODYL 10 MG RE SUPP: 10.0000 mg | Freq: Once | RECTAL | Status: DC

## 2022-01-24 MED ORDER — ENSURE ENLIVE PO LIQD: 237.0000 mL | Freq: Two times a day (BID) | ORAL | 12 refills | Status: DC

## 2022-01-24 MED ORDER — CHLORHEXIDINE GLUCONATE CLOTH 2 % EX PADS
6.0000 | MEDICATED_PAD | Freq: Every day | CUTANEOUS | Status: DC
  Administered 2022-01-24: 6 via TOPICAL

## 2022-01-24 MED ORDER — METOPROLOL TARTRATE 25 MG PO TABS: 12.5000 mg | ORAL_TABLET | Freq: Two times a day (BID) | ORAL | 0 refills | Status: DC

## 2022-01-24 NOTE — Progress Notes (Signed)
Patient dialyzes on MWF schedule. No dialysis needs today.   Lynnda Child PA-C [facility] 01/24/2022,10:40 AM

## 2022-01-24 NOTE — Care Management Obs Status (Signed)
[place] NOTIFICATION   Patient Details  Name: [name] MRN: [id number] Date of Birth: Nov 18, 1951   Medicare Observation Status Notification Given:  Yes  Given via phone permission given to sign, attached to DC instructions for print out  Verdell Carmine, RN 01/24/2022, 11:54 AM

## 2022-01-24 NOTE — Progress Notes (Signed)
Nsg Discharge Note  Admit Date:  01/22/2022 Discharge date: 01/24/2022   [name] to be D/C'd Home per MD order.  AVS completed.    Discharge Medication: Allergies as of 01/24/2022       Reactions   Sulfa Antibiotics Other (See Comments)   Per patient, both parents allergic-so will not take   Adhesive [tape] Itching        Medication List     STOP taking these medications    losartan 50 MG tablet Commonly known as: COZAAR       TAKE these medications    acetaminophen 500 MG tablet Commonly known as: TYLENOL Take 1 tablet (500 mg total) by mouth every 6 (six) hours as needed.   acidophilus Caps capsule Take 2 capsules by mouth 3 (three) times daily.   albuterol 108 (90 Base) MCG/ACT inhaler Commonly known as: VENTOLIN HFA Inhale 1-2 puffs into the lungs every 6 (six) hours as needed for wheezing or shortness of breath.   allopurinol 300 MG tablet Commonly known as: ZYLOPRIM Take 150 mg by mouth daily.   ALPRAZolam 0.25 MG tablet Commonly known as: XANAX Take 1 tablet (0.25 mg total) by mouth daily as needed for anxiety.   aspirin EC 81 MG tablet Take 81 mg by mouth at bedtime.   atorvastatin 40 MG tablet Commonly known as: LIPITOR Take 1 tablet (40 mg total) by mouth daily.   cinacalcet 30 MG tablet Commonly known as: SENSIPAR Take 30 mg by mouth 3 (three) times a week. Mon, Wed, Fri-- Given @ Dialysis   feeding supplement Liqd Take 237 mLs by mouth 2 (two) times daily between meals.   folic acid-vitamin b complex-vitamin c-selenium-zinc 3 MG Tabs tablet Take 1 tablet by mouth daily.   metoprolol tartrate 25 MG tablet Commonly known as: LOPRESSOR Take 0.5 tablets (12.5 mg total) by mouth 2 (two) times daily.   ondansetron 8 MG tablet Commonly known as: ZOFRAN Take 8 mg by mouth 2 (two) times daily as needed for nausea or vomiting.   pantoprazole 40 MG tablet Commonly known as: PROTONIX Take 1 tablet (40 mg total) by mouth daily.    polyethylene glycol 17 g packet Commonly known as: MIRALAX / GLYCOLAX Take 17 g by mouth 2 (two) times daily.        Discharge Assessment: Vitals:   01/24/22 0313 01/24/22 0825  BP: (!) 102/49 (!) 96/42  Pulse: 93 88  Resp: 18 16  Temp: 98 F (36.7 C) 97.9 F (36.6 C)  SpO2: 99% 96%   Skin clean, dry and intact without evidence of skin break down, no evidence of skin tears noted. IV catheter discontinued intact. Site without signs and symptoms of complications - no redness or edema noted at insertion site, patient denies c/o pain - only slight tenderness at site.  Dressing with slight pressure applied.  D/c Instructions-Education: Discharge instructions given to patient/family with verbalized understanding. D/c education completed with patient/family including follow up instructions, medication list, d/c activities limitations if indicated, with other d/c instructions as indicated by MD - patient able to verbalize understanding, all questions fully answered. Patient instructed to return to ED, call 911, or call MD for any changes in condition.  Patient escorted via [place], and D/C home via private auto.  Hiram Comber, RN 01/24/2022 12:23 PM

## 2022-01-24 NOTE — Discharge Instructions (Signed)
Medicare Outpatient Observation Notice   Patient name:  [name] Patient number:  [id number]                                                                                                                                                                       [facility] a hospital outpatient receiving observation services. You are not an inpatient because:    diverticulitis   You require hospital care for evaluation and/or treatment.  It is expected you will need hospital care for less than a total of two days.                                                                                                                                                                         Being an outpatient may affect what you pay in a hospital:   When youre a hospital outpatient, your observation stay is covered under Medicare Part B.   For Part B services, you generally pay:   A copayment for each outpatient hospital service you get. Part B copayments may vary by type of service.   20% of the Medicare-approved amount for most doctor services, after the Part B deductible.   Observation services may affect coverage and payment of your care after you leave the hospital:     If you need skilled nursing facility (SNF) care after you leave the hospital, Medicare Part A will only cover SNF care if youve had a 3-day minimum, medically necessary, inpatient hospital stay for a related illness or injury. An inpatient hospital stay begins the day the hospital admits you as an inpatient based on a doctors order and doesnt include the day youre discharged.   If you have Medicaid, a Medicare Advantage plan or other health plan, Medicaid or the plan may have different rules for SNF coverage after you leave the hospital. Check with Medicaid or your plan.   NOTE: Medicare Part A generally doesnt cover outpatient hospital services, like  an observation stay. However, Part A will generally cover medically  necessary inpatient services if the hospital admits you as an inpatient based on a doctors order. In most cases, youll pay a one-time deductible for all of your inpatient hospital services for the first 60 days youre in a hospital.                                                                                                                                                                      If you have any questions about your observation services, ask the hospital staff member giving you this notice or the doctor providing your hospital care. You can also ask to speak with someone from the hospitals utilization or discharge planning department.   You can also call 1-800-MEDICARE (1-313-028-8713).  TTY users should call [phone].   Form CMS 42683-MHDQ   Expiration 12/13/2021 [employer]MB APPROVAL [id number]          Your costs for medications:     Generally, prescription and over-the-counter drugs, including self-administered drugs, you get in a hospital outpatient setting (like an emergency department) arent covered by Part B. Self- administered drugs are drugs youd normally take on your own. For safety reasons, many hospitals dont allow you to take medications brought from home. If you have a Medicare prescription drug plan (Part D), your plan may help you pay for these drugs. Youll likely need to pay out-of- pocket for these drugs and submit a claim to your drug plan for a refund. Contact your drug plan for more information.                                                                                                                                                                        If youre enrolled in a Medicare Advantage plan (like an HMO or PPO) or other Medicare health plan (Part C), your costs and coverage may be different. Check with your plan to find out about coverage for  outpatient observation services.    If youre a Chief of Staff through your  state Medicaid program, you cant be billed for Part A or Part B deductibles, coinsurance, and copayments.                                                                                                                                                                      Additional Information (Optional):                                                                                                                                                                                Please sign below to show you received and understand this notice.                                         Date: 01/24/22 / Time:11:53 AM   CMS does not discriminate in its programs and activities. To request this publication in alternative format, please call: 1-800-MEDICARE or email:[email or web address].   Form CMS 02637-CHYI   Expiration 12/13/2021 [employer] APPROVAL [id number]      Patient   Add No image attached Trace Slow Corrupt Edit Data Change Template Print On

## 2022-01-24 NOTE — Discharge Summary (Signed)
Physician Discharge Summary   Patient: [name] MRN: [id number] DOB: August 02, 1951  Admit date:     01/22/2022  Discharge date: 01/24/22  Discharge Physician: Fran Lowes   PCP: Renford Dills, MD   Recommendations at discharge:    Discharge to home after patient has a BM. Follow up with PCP in 7-10 days. Keep dialysis appointments.  Discharge Diagnoses: Principal Problem:   Diverticulitis Active Problems:   End-stage renal disease on hemodialysis (HCC)   Hyperkalemia   Chronic diastolic CHF (congestive heart failure) (HCC) Constipation Resolved Problems:   * No resolved hospital problems. *   Hospital Course: [name] is a 71 y.o. female with medical history significant of ESRD on HD, MWF, chronic diastolic CHF, HTN, HLD, anemia of chronic disease, presented with worsening of abdominal pain and diarrhea.   Patient started to have a cramping-like left lower quadrant abdominal pain since yesterday morning, intermittent.  She came to ED yesterday morning, asking for dialysis, and was reassured and sent home.  She denies any chest pain, no cough no fever or chills.  As she was in the ED whole day yesterday, she missed yesterday's dialysis.  Overnight, she had a loose bowel movement nonbloody, and she woke up this morning with worsening of cramp-like abdominal pain same location, then she passed another loose bowel movement in the ED.  She had diverticulitis last year and she felt it was the exact symptoms she had diverticulitis and last time.   ED Course: Afebrile, no tachycardia noted hypotension.  WBC 6.2, BUN 67, creatinine 8.6, K5.4.   Nephrology contacted and 1 dose of Lokelma ordered by ED. The patient was taken to dialysis today. She was started on Zosyn and CT abdomen and pelvis was performed. It demonstrated no evidence of diverticulitis, although she does have diverticulosis. This morning the patient is complaining of abdominal cramping. She states that she thinks  that she is constipated. She has been given docusate sodium and a bisacodyl suppository. Zosyn has been stopped.   She will be discharged to home after she has a BM. She has instructions to take miralax twice daily.  Assessment and Plan: * Diverticulitis- (present on admission) Pt with pain in left lower quadrant, no fever, no leukocytosis. Will place the patient on zosyn and check CT abdomen and pelvis.   Chronic diastolic CHF (congestive heart failure) (HCC)- (present on admission) Patient appears euvolemic. Monitor volume status carefully.  Hyperkalemia- (present on admission) Due to missing HD on 01/21/2022. Lokelma given. Will monitor after HD for correction.  End-stage renal disease on hemodialysis [facility]) Nephrology consulted. The patient was taken for HD today as she missed her dialysis on 01/21/2022. I appreciate their assistance.  Constipation: Likely due to ESRD. Patient has received dulcolax and bisacodyl suppository. She will be discharged to home after she has a BM. She has instructions to take miralax daily.  BMI is 18.43.  Consultants: Nephrology Procedures performed: HD  Disposition: Home Diet recommendation:  Discharge Diet Orders (From admission, onward)     Start     Ordered   01/24/22 0000  Diet - low sodium heart healthy        01/24/22 1025           Regular diet  DISCHARGE MEDICATION: Allergies as of 01/24/2022       Reactions   Sulfa Antibiotics Other (See Comments)   Per patient, both parents allergic-so will not take   Adhesive [tape] Itching  Medication List     STOP taking these medications    losartan 50 MG tablet Commonly known as: COZAAR       TAKE these medications    acetaminophen 500 MG tablet Commonly known as: TYLENOL Take 1 tablet (500 mg total) by mouth every 6 (six) hours as needed.   acidophilus Caps capsule Take 2 capsules by mouth 3 (three) times daily.   albuterol 108 (90 Base) MCG/ACT inhaler Commonly  known as: VENTOLIN HFA Inhale 1-2 puffs into the lungs every 6 (six) hours as needed for wheezing or shortness of breath.   allopurinol 300 MG tablet Commonly known as: ZYLOPRIM Take 300 mg by mouth daily.   ALPRAZolam 0.25 MG tablet Commonly known as: XANAX Take 1 tablet (0.25 mg total) by mouth daily as needed for anxiety.   aspirin EC 81 MG tablet Take 81 mg by mouth at bedtime.   atorvastatin 40 MG tablet Commonly known as: LIPITOR Take 1 tablet (40 mg total) by mouth daily.   cinacalcet 30 MG tablet Commonly known as: SENSIPAR Take 30 mg by mouth 3 (three) times a week. Mon, Wed, Fri-- Given @ Dialysis   feeding supplement Liqd Take 237 mLs by mouth 2 (two) times daily between meals.   folic acid-vitamin b complex-vitamin c-selenium-zinc 3 MG Tabs tablet Take 1 tablet by mouth daily.   metoprolol tartrate 25 MG tablet Commonly known as: LOPRESSOR Take 0.5 tablets (12.5 mg total) by mouth 2 (two) times daily.   ondansetron 8 MG tablet Commonly known as: ZOFRAN Take 8 mg by mouth 2 (two) times daily as needed for nausea or vomiting.   pantoprazole 40 MG tablet Commonly known as: PROTONIX Take 1 tablet (40 mg total) by mouth daily.   polyethylene glycol 17 g packet Commonly known as: MIRALAX / GLYCOLAX Take 17 g by mouth 2 (two) times daily.   zolpidem 10 MG tablet Commonly known as: AMBIEN Take 10 mg by mouth at bedtime as needed for sleep.        Discharge Exam: Filed Weights   01/23/22 0848 01/23/22 1157 01/24/22 0500  Weight: 49.4 kg 46.4 kg 45.7 kg   Exam:  Constitutional:  The patient is awake, alert, and oriented x 3. No acute distress. Respiratory:  No increased work of breathing. No wheezes, rales, or rhonchi No tactile fremitus Cardiovascular:  Regular rate and rhythm No murmurs, ectopy, or gallups. No lateral PMI. No thrills. Abdomen:  Abdomen is soft, non-tender, non-distended No hernias, masses, or organomegaly Normoactive bowel  sounds.  Musculoskeletal:  No cyanosis, clubbing, or edema Skin:  No rashes, lesions, ulcers palpation of skin: no induration or nodules Neurologic:  CN 2-12 intact Sensation all 4 extremities intact Psychiatric:  Mental status Mood, affect appropriate Orientation to person, place, time  judgment and insight appear intact   Condition at discharge: fair  The results of significant diagnostics from this hospitalization (including imaging, microbiology, ancillary and laboratory) are listed below for reference.   Imaging Studies: DG Chest 2 View  Result Date: 01/21/2022 CLINICAL DATA:  Shortness of breath EXAM: CHEST - 2 VIEW COMPARISON:  01/11/2022 FINDINGS: Cardiomegaly. Thickened central vessels with cephalized flow. Patchy reticular opacity correlating with scattered microlithiasis and scar-like appearance on 2020 CT. Large lung volumes with diaphragm flattening. There is no edema, consolidation, effusion, or pneumothorax. IMPRESSION: 1. Chronic cardiomegaly and vascular congestion. 2. Pulmonary scarring. Electronically Signed   By: Tiburcio Pea M.D.   On: 01/21/2022 05:38   DG Chest 2 View  Result Date: 01/09/2022 CLINICAL DATA:  Shortness of breath EXAM: CHEST - 2 VIEW COMPARISON:  12/28/2021 FINDINGS: Cardiomegaly and aortic tortuosity with diffuse aortic calcification. Congested appearance of vessels diffusely. No visible effusion. Lung volumes are chronically large. Prominent calcification at the aortic annulus and throughout the arch and descending segment. IMPRESSION: Cardiomegaly and vascular congestion. Electronically Signed   By: Tiburcio Pea M.D.   On: 01/09/2022 05:27   DG Chest 2 View  Result Date: 12/28/2021 CLINICAL DATA:  Chest pain EXAM: CHEST - 2 VIEW COMPARISON:  Thirteen days ago FINDINGS: Cardiomegaly and aortic tortuosity. Stable generalized interstitial coarsening. There is no edema, consolidation, effusion, or pneumothorax. Large lung volumes. IMPRESSION:  1. No acute finding when compared to prior. 2. Cardiomegaly and chronic lung disease. Electronically Signed   By: Tiburcio Pea M.D.   On: 12/28/2021 07:56   CT ABDOMEN PELVIS W CONTRAST  Result Date: 01/23/2022 CLINICAL DATA:  Abdominal pain, known diverticulitis EXAM: CT ABDOMEN AND PELVIS WITH CONTRAST TECHNIQUE: Multidetector CT imaging of the abdomen and pelvis was performed using the standard protocol following bolus administration of intravenous contrast. RADIATION DOSE REDUCTION: This exam was performed according to the departmental dose-optimization program which includes automated exposure control, adjustment of the mA and/or kV according to patient size and/or use of iterative reconstruction technique. CONTRAST:  OMNIPAQUE IOHEXOL 300 MG/ML  SOLN COMPARISON:  11/17/2021 FINDINGS: Lower chest: Sequela of prior aspirated barium at the lung bases (series 5/image 10). Cardiomegaly. Hepatobiliary: Liver is within normal limits. Status post cholecystectomy. No intrahepatic or extrahepatic ductal dilatation. Pancreas: Within normal limits. Spleen: Within normal limits. Adrenals/Urinary Tract: Adrenal glands are within normal limits. Bilateral renal atrophy with dominant 6.3 cm left renal cyst (series 4/image 29), previously 5.6 cm. No hydronephrosis. Bladder is decompressed. Stomach/Bowel: Stomach is within normal limits. No evidence of bowel obstruction. Appendix is not discretely visualized. Sigmoid diverticulosis, without evidence of diverticulitis. Vascular/Lymphatic: No evidence of abdominal aortic aneurysm. Atherosclerotic calcifications of the abdominal aorta and branch vessels. 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 No suspicious abdominopelvic lymphadenopathy. Reproductive: Uterus and right ovary are within normal limits. Stable prominence of the left ovary without discrete mass. Other: No abdominopelvic ascites. Musculoskeletal: Degenerative changes at L4-5 with  associated postsurgical changes at this level. IMPRESSION: Sigmoid diverticulosis, without evidence of diverticulitis. Additional stable ancillary findings as above. Electronically Signed   By: Charline Bills M.D.   On: 01/23/2022 22:53   DG CHEST PORT 1 VIEW  Result Date: 01/22/2022 CLINICAL DATA:  Cough, shortness of breath, and weakness. Nausea and abdominal pain. EXAM: PORTABLE CHEST 1 VIEW COMPARISON:  Chest x-ray from yesterday. FINDINGS: Unchanged cardiomegaly. Similar chronic pulmonary vascular congestion and mild interstitial thickening, most apparent at the left lung base. No focal consolidation, pleural effusion, or pneumothorax. No acute osseous abnormality. IMPRESSION: 1. Chronic cardiomegaly and mild pulmonary interstitial edema. Electronically Signed   By: Obie Dredge M.D.   On: 01/22/2022 13:43   DG Chest Portable 1 View  Result Date: 01/11/2022 CLINICAL DATA:  Shortness of breath. EXAM: PORTABLE CHEST 1 VIEW COMPARISON:  Chest PA Lat recently 01/09/2022. FINDINGS: The heart is moderately enlarged. The aorta is tortuous and calcified with aortic ectasia, stable mediastinum allowing for patient rotation to the right. There are chronically prominent perihilar vessels. No acute edema is seen. No pleural effusion is evident. There are chronic interstitial changes in the upper lung fields, chronic pulmonary emphysema without focal infiltrate. Osteopenia. IMPRESSION: Cardiomegaly with chronic perihilar vascular  congestion. No acute edema findings. COPD and chronic change. Aortic atherosclerosis. Stable chest. Electronically Signed   By: Almira Bar M.D.   On: 01/11/2022 04:41   DG Abdomen Acute W/Chest  Result Date: 01/22/2022 CLINICAL DATA:  Chest and abdominal pain EXAM: DG ABDOMEN ACUTE WITH 1 VIEW CHEST COMPARISON:  Radiograph dated December 19, 2012 FINDINGS: Nonobstructive bowel gas pattern. In aortoiliac vascular calcifications. Visualized lung bases demonstrate bibasilar  atelectasis and cardiomegaly. No acute osseous abnormality. IMPRESSION: Nonobstructive bowel gas pattern. Electronically Signed   By: Allegra Lai M.D.   On: 01/22/2022 09:03   DG Foot Complete Left  Result Date: 01/10/2022 CLINICAL DATA:  71 year old female with history of left foot pain. Chronic open wound anterior to the mid metatarsals. EXAM: LEFT FOOT - COMPLETE 3+ VIEW COMPARISON:  Left foot radiograph 11/27/2021. FINDINGS: There is mild irregularity of the soft tissues of the dorsal aspect of the foot overlying the mid and proximal metatarsals. No destructive osseous lesions are noted in the underlying bones. Old healing fracture of the distal third of the third proximal phalanx. No acute displaced fracture, subluxation or dislocation is noted. Severe joint space narrowing, subchondral sclerosis, subchondral cyst formation and osteophyte formation are noted in the first MTP joint. IMPRESSION: 1. No destructive osseous lesions to suggest osteomyelitis. 2. Chronic degenerative and posttraumatic changes, as above. No new acute osseous findings are noted. Electronically Signed   By: Trudie Reed M.D.   On: 01/10/2022 11:08    Microbiology: Results for orders placed or performed during the hospital encounter of 01/22/22  Resp Panel by RT-PCR (Flu A&B, Covid) Nasopharyngeal Swab     Status: None   Collection Time: 01/22/22  8:47 PM   Specimen: Nasopharyngeal Swab; Nasopharyngeal(NP) swabs in vial transport medium  Result Value Ref Range Status   SARS Coronavirus 2 by RT PCR NEGATIVE NEGATIVE Final    Comment: (NOTE) SARS-CoV-2 target nucleic acids are NOT DETECTED.  The SARS-CoV-2 RNA is generally detectable in upper respiratory specimens during the acute phase of infection. The lowest concentration of SARS-CoV-2 viral copies this assay can detect is 138 copies/mL. A negative result does not preclude SARS-Cov-2 infection and should not be used as the sole basis for treatment or other  patient management decisions. A negative result may occur with  improper specimen collection/handling, submission of specimen other than nasopharyngeal swab, presence of viral mutation(s) within the areas targeted by this assay, and inadequate number of viral copies(<138 copies/mL). A negative result must be combined with clinical observations, patient history, and epidemiological information. The expected result is Negative.  Fact Sheet for Patients:  BloggerCourse.com  Fact Sheet for Healthcare Providers:  SeriousBroker.it  This test is no t yet approved or cleared by the [place] FDA and  has been authorized for detection and/or diagnosis of SARS-CoV-2 by FDA under an Emergency Use Authorization (EUA). This EUA will remain  in effect (meaning this test can be used) for the duration of the COVID-19 declaration under Section 564(b)(1) of the Act, 21 U.S.C.section 360bbb-3(b)(1), unless the authorization is terminated  or revoked sooner.       Influenza A by PCR NEGATIVE NEGATIVE Final   Influenza B by PCR NEGATIVE NEGATIVE Final    Comment: (NOTE) The Xpert Xpress SARS-CoV-2/FLU/RSV plus assay is intended as an aid in the diagnosis of influenza from Nasopharyngeal swab specimens and should not be used as a sole basis for treatment. Nasal washings and aspirates are unacceptable for Xpert Xpress SARS-CoV-2/FLU/RSV testing.  Fact  Sheet for Patients: BloggerCourse.com  Fact Sheet for Healthcare Providers: SeriousBroker.it  This test is not yet approved or cleared by the [place] FDA and has been authorized for detection and/or diagnosis of SARS-CoV-2 by FDA under an Emergency Use Authorization (EUA). This EUA will remain in effect (meaning this test can be used) for the duration of the COVID-19 declaration under Section 564(b)(1) of the Act, 21 U.S.C. section  360bbb-3(b)(1), unless the authorization is terminated or revoked.  Performed at [facility], [address], [place], [place] [address]     Labs: CBC: Recent Labs  Lab 01/21/22 0522 01/22/22 0815 01/22/22 0818 01/22/22 1356 01/23/22 0236 01/23/22 1512  WBC 5.8  --  6.4  --  5.7 5.4  NEUTROABS 3.1  --   --   --   --   --   HGB 10.9* 11.9* 11.2* 13.9 11.2* 12.0  HCT 33.2* 35.0* 34.5* 41.0 34.1* 37.9  MCV 93.3  --  93.8  --  92.7 93.6  PLT 184  --  187  --  188 195   Basic Metabolic Panel: Recent Labs  Lab 01/21/22 0522 01/22/22 0815 01/22/22 0818 01/22/22 1356 01/22/22 2201 01/23/22 0236 01/23/22 0826 01/23/22 1512 01/23/22 1853 01/23/22 2323 01/24/22 0707  NA 139 135 137 134*  --  138  --   --   --   --   --   K 4.4 5.4* 5.5* 5.8* 7.2* 4.6 5.4* 3.9 4.2 3.9 4.5  CL 94* 96* 94* 97*  --  95*  --   --   --   --   --   CO2 31  --  25  --   --  24  --   --   --   --   --   GLUCOSE 83 84 85 88  --  73  --   --   --   --   --   BUN 50* 67* 71* 78*  --  81*  --   --   --   --   --   CREATININE 6.00* 8.60* 7.70* 8.40*  --  8.55*  --   --   --   --   --   CALCIUM 8.8*  --  9.0  --   --  8.9  --   --   --   --   --   MG  --   --   --   --  2.4  --   --   --   --   --   --    Liver Function Tests: Recent Labs  Lab 01/22/22 0818  AST 27  ALT 14  ALKPHOS 173*  BILITOT 0.7  PROT 7.4  ALBUMIN 3.2*   CBG: Recent Labs  Lab 01/23/22 0004  GLUCAP 117*    Discharge time spent: greater than 30 minutes.  Signed: Fredia Chittenden, DO [facility] 01/24/2022

## 2022-01-26 ENCOUNTER — Telehealth (HOSPITAL_COMMUNITY): Payer: Self-pay | Admitting: Nephrology

## 2022-01-26 DIAGNOSIS — L299 Pruritus, unspecified: Secondary | ICD-10-CM | POA: Diagnosis not present

## 2022-01-26 DIAGNOSIS — R197 Diarrhea, unspecified: Secondary | ICD-10-CM | POA: Diagnosis not present

## 2022-01-26 DIAGNOSIS — N186 End stage renal disease: Secondary | ICD-10-CM | POA: Diagnosis not present

## 2022-01-26 DIAGNOSIS — D631 Anemia in chronic kidney disease: Secondary | ICD-10-CM | POA: Diagnosis not present

## 2022-01-26 DIAGNOSIS — N2581 Secondary hyperparathyroidism of renal origin: Secondary | ICD-10-CM | POA: Diagnosis not present

## 2022-01-26 DIAGNOSIS — R52 Pain, unspecified: Secondary | ICD-10-CM | POA: Diagnosis not present

## 2022-01-26 DIAGNOSIS — E875 Hyperkalemia: Secondary | ICD-10-CM | POA: Diagnosis not present

## 2022-01-26 DIAGNOSIS — Z992 Dependence on renal dialysis: Secondary | ICD-10-CM | POA: Diagnosis not present

## 2022-01-26 NOTE — Telephone Encounter (Signed)
Transition of care contact from inpatient facility  Date of discharge: 01/24/2022 Date of contact: 01/26/2022 Method: Phone Spoke to: Patient's family   Patient contacted to discuss transition of care from recent inpatient hospitalization. Patient was admitted to [facility] from 01/22/22 - 01/24/2022 with discharge diagnosis of diverticulitis.  Medication changes were reviewed. Doesn't look like any changes.  Patient will follow up with his/her outpatient HD unit on: today.  Patient's family member short on discussion - says she is doing well and hung up the phone. Will try to touch base with her at dialysis today.  Veneta Penton, PA-C [employer] Pager [phone]

## 2022-01-28 DIAGNOSIS — E875 Hyperkalemia: Secondary | ICD-10-CM | POA: Diagnosis not present

## 2022-01-28 DIAGNOSIS — N186 End stage renal disease: Secondary | ICD-10-CM | POA: Diagnosis not present

## 2022-01-28 DIAGNOSIS — Z992 Dependence on renal dialysis: Secondary | ICD-10-CM | POA: Diagnosis not present

## 2022-01-28 DIAGNOSIS — L299 Pruritus, unspecified: Secondary | ICD-10-CM | POA: Diagnosis not present

## 2022-01-28 DIAGNOSIS — R197 Diarrhea, unspecified: Secondary | ICD-10-CM | POA: Diagnosis not present

## 2022-01-28 DIAGNOSIS — D631 Anemia in chronic kidney disease: Secondary | ICD-10-CM | POA: Diagnosis not present

## 2022-01-28 DIAGNOSIS — N2581 Secondary hyperparathyroidism of renal origin: Secondary | ICD-10-CM | POA: Diagnosis not present

## 2022-01-28 DIAGNOSIS — R52 Pain, unspecified: Secondary | ICD-10-CM | POA: Diagnosis not present

## 2022-01-30 DIAGNOSIS — Z992 Dependence on renal dialysis: Secondary | ICD-10-CM | POA: Diagnosis not present

## 2022-01-30 DIAGNOSIS — E875 Hyperkalemia: Secondary | ICD-10-CM | POA: Diagnosis not present

## 2022-01-30 DIAGNOSIS — N186 End stage renal disease: Secondary | ICD-10-CM | POA: Diagnosis not present

## 2022-01-30 DIAGNOSIS — R197 Diarrhea, unspecified: Secondary | ICD-10-CM | POA: Diagnosis not present

## 2022-01-30 DIAGNOSIS — N2581 Secondary hyperparathyroidism of renal origin: Secondary | ICD-10-CM | POA: Diagnosis not present

## 2022-01-30 DIAGNOSIS — R52 Pain, unspecified: Secondary | ICD-10-CM | POA: Diagnosis not present

## 2022-01-30 DIAGNOSIS — L299 Pruritus, unspecified: Secondary | ICD-10-CM | POA: Diagnosis not present

## 2022-01-30 DIAGNOSIS — D631 Anemia in chronic kidney disease: Secondary | ICD-10-CM | POA: Diagnosis not present

## 2022-02-02 ENCOUNTER — Emergency Department (HOSPITAL_COMMUNITY): Payer: Medicare Other

## 2022-02-02 ENCOUNTER — Other Ambulatory Visit: Payer: Self-pay

## 2022-02-02 ENCOUNTER — Emergency Department (HOSPITAL_COMMUNITY)
Admission: EM | Admit: 2022-02-02 | Discharge: 2022-02-02 | Disposition: A | Discharge status: Home / Self Care | Payer: Medicare Other | Attending: Emergency Medicine | Admitting: Emergency Medicine

## 2022-02-02 ENCOUNTER — Encounter (HOSPITAL_COMMUNITY): Payer: Self-pay | Admitting: *Deleted

## 2022-02-02 DIAGNOSIS — N186 End stage renal disease: Secondary | ICD-10-CM | POA: Insufficient documentation

## 2022-02-02 DIAGNOSIS — R059 Cough, unspecified: Secondary | ICD-10-CM | POA: Diagnosis not present

## 2022-02-02 DIAGNOSIS — E875 Hyperkalemia: Secondary | ICD-10-CM | POA: Diagnosis not present

## 2022-02-02 DIAGNOSIS — I509 Heart failure, unspecified: Secondary | ICD-10-CM | POA: Diagnosis not present

## 2022-02-02 DIAGNOSIS — Z992 Dependence on renal dialysis: Secondary | ICD-10-CM | POA: Insufficient documentation

## 2022-02-02 DIAGNOSIS — Z79899 Other long term (current) drug therapy: Secondary | ICD-10-CM | POA: Insufficient documentation

## 2022-02-02 DIAGNOSIS — R197 Diarrhea, unspecified: Secondary | ICD-10-CM | POA: Diagnosis not present

## 2022-02-02 DIAGNOSIS — R0602 Shortness of breath: Secondary | ICD-10-CM | POA: Diagnosis not present

## 2022-02-02 DIAGNOSIS — R52 Pain, unspecified: Secondary | ICD-10-CM | POA: Diagnosis not present

## 2022-02-02 DIAGNOSIS — D631 Anemia in chronic kidney disease: Secondary | ICD-10-CM | POA: Diagnosis not present

## 2022-02-02 DIAGNOSIS — Z7982 Long term (current) use of aspirin: Secondary | ICD-10-CM | POA: Insufficient documentation

## 2022-02-02 DIAGNOSIS — N2581 Secondary hyperparathyroidism of renal origin: Secondary | ICD-10-CM | POA: Diagnosis not present

## 2022-02-02 DIAGNOSIS — R0902 Hypoxemia: Secondary | ICD-10-CM | POA: Diagnosis not present

## 2022-02-02 DIAGNOSIS — L299 Pruritus, unspecified: Secondary | ICD-10-CM | POA: Diagnosis not present

## 2022-02-02 LAB — CBC WITH DIFFERENTIAL/PLATELET
Abs Immature Granulocytes: 0.05 10*3/uL (ref 0.00–0.07)
Basophils Absolute: 0.1 10*3/uL (ref 0.0–0.1)
Basophils Relative: 1 %
Eosinophils Absolute: 0.1 10*3/uL (ref 0.0–0.5)
Eosinophils Relative: 2 %
HCT: 35.7 % — ABNORMAL LOW (ref 36.0–46.0)
Hemoglobin: 11.3 g/dL — ABNORMAL LOW (ref 12.0–15.0)
Immature Granulocytes: 1 %
Lymphocytes Relative: 23 %
Lymphs Abs: 1.9 10*3/uL (ref 0.7–4.0)
MCH: 30.3 pg (ref 26.0–34.0)
MCHC: 31.7 g/dL (ref 30.0–36.0)
MCV: 95.7 fL (ref 80.0–100.0)
Monocytes Absolute: 1.1 10*3/uL — ABNORMAL HIGH (ref 0.1–1.0)
Monocytes Relative: 13 %
Neutro Abs: 5.2 10*3/uL (ref 1.7–7.7)
Neutrophils Relative %: 60 %
Platelets: 278 10*3/uL (ref 150–400)
RBC: 3.73 MIL/uL — ABNORMAL LOW (ref 3.87–5.11)
RDW: 18.9 % — ABNORMAL HIGH (ref 11.5–15.5)
WBC: 8.4 10*3/uL (ref 4.0–10.5)
nRBC: 0.6 % — ABNORMAL HIGH (ref 0.0–0.2)

## 2022-02-02 LAB — I-STAT ARTERIAL BLOOD GAS, ED
Acid-Base Excess: 2 mmol/L (ref 0.0–2.0)
Bicarbonate: 24.2 mmol/L (ref 20.0–28.0)
Calcium, Ion: 1.03 mmol/L — ABNORMAL LOW (ref 1.15–1.40)
HCT: 35 % — ABNORMAL LOW (ref 36.0–46.0)
Hemoglobin: 11.9 g/dL — ABNORMAL LOW (ref 12.0–15.0)
O2 Saturation: 95 %
Patient temperature: 97.9
Potassium: 4.6 mmol/L (ref 3.5–5.1)
Sodium: 137 mmol/L (ref 135–145)
TCO2: 25 mmol/L (ref 22–32)
pCO2 arterial: 28.1 mmHg — ABNORMAL LOW (ref 32–48)
pH, Arterial: 7.542 — ABNORMAL HIGH (ref 7.35–7.45)
pO2, Arterial: 64 mmHg — ABNORMAL LOW (ref 83–108)

## 2022-02-02 LAB — BASIC METABOLIC PANEL
Anion gap: 19 — ABNORMAL HIGH (ref 5–15)
BUN: 53 mg/dL — ABNORMAL HIGH (ref 8–23)
CO2: 24 mmol/L (ref 22–32)
Calcium: 9.1 mg/dL (ref 8.9–10.3)
Chloride: 94 mmol/L — ABNORMAL LOW (ref 98–111)
Creatinine, Ser: 6.89 mg/dL — ABNORMAL HIGH (ref 0.44–1.00)
GFR, Estimated: 6 mL/min — ABNORMAL LOW (ref >60)
Glucose, Bld: 106 mg/dL — ABNORMAL HIGH (ref 70–99)
Potassium: 4.7 mmol/L (ref 3.5–5.1)
Sodium: 137 mmol/L (ref 135–145)

## 2022-02-02 LAB — BRAIN NATRIURETIC PEPTIDE: B Natriuretic Peptide: >4500 pg/mL — ABNORMAL HIGH (ref 0.0–100.0)

## 2022-02-02 NOTE — ED Provider Notes (Signed)
[place] EMERGENCY DEPARTMENT Provider Note   CSN: [id number] Arrival date & time: 02/02/22  0056     History  Chief Complaint  Patient presents with   Shortness of Breath    [name] is a 71 y.o. female with past maced to go history of end-stage renal disease last dialysis on Friday.  Patient complains of 1 week of productive cough.  She is feeling somewhat short of breath.  She denies orthopnea.  She denies fever or chills.  Patient denies chest pain   Shortness of Breath     Home Medications Prior to Admission medications   Medication Sig Start Date End Date Taking? Authorizing Provider  acetaminophen (TYLENOL) 500 MG tablet Take 1 tablet (500 mg total) by mouth every 6 (six) hours as needed. 09/05/18   Law, Bea Graff, PA-C  acidophilus (RISAQUAD) CAPS capsule Take 2 capsules by mouth 3 (three) times daily. 01/24/22   Swayze, Ava, DO  albuterol (VENTOLIN HFA) 108 (90 Base) MCG/ACT inhaler Inhale 1-2 puffs into the lungs every 6 (six) hours as needed for wheezing or shortness of breath. 01/11/22   Mesner, Corene Cornea, MD  allopurinol (ZYLOPRIM) 300 MG tablet Take 150 mg by mouth daily. 11/03/21   [provider]  ALPRAZolam Duanne Moron) 0.25 MG tablet Take 1 tablet (0.25 mg total) by mouth daily as needed for anxiety. 08/18/21   Nita Sells, MD  aspirin EC 81 MG tablet Take 81 mg by mouth at bedtime.    [provider]  atorvastatin (LIPITOR) 40 MG tablet Take 1 tablet (40 mg total) by mouth daily. 01/13/22   Burnell Blanks, MD  cinacalcet (SENSIPAR) 30 MG tablet Take 30 mg by mouth 3 (three) times a week. Mon, Wed, Fri-- Given @ Dialysis 01/04/20   [provider]  feeding supplement (ENSURE ENLIVE / ENSURE PLUS) LIQD Take 237 mLs by mouth 2 (two) times daily between meals. 01/24/22   Swayze, Ava, DO  folic acid-vitamin b complex-vitamin c-selenium-zinc (DIALYVITE) 3 MG TABS tablet Take 1 tablet by mouth daily.     [provider]  metoprolol tartrate (LOPRESSOR) 25 MG tablet Take 0.5 tablets (12.5 mg total) by mouth 2 (two) times daily. 01/24/22   Swayze, Ava, DO  ondansetron (ZOFRAN) 8 MG tablet Take 8 mg by mouth 2 (two) times daily as needed for nausea or vomiting.     [provider]  pantoprazole (PROTONIX) 40 MG tablet Take 1 tablet (40 mg total) by mouth daily. 08/18/21   Nita Sells, MD  polyethylene glycol (MIRALAX / GLYCOLAX) 17 g packet Take 17 g by mouth 2 (two) times daily. 01/24/22   Swayze, Ava, DO      Allergies    Sulfa antibiotics and Adhesive [tape]    Review of Systems   Review of Systems  Respiratory:  Positive for shortness of breath.    Physical Exam Updated Vital Signs BP (!) 168/69 (BP Location: Left Leg)    Pulse 83    Temp 97.9 F (36.6 C)    Resp 16    SpO2 93%  Physical Exam  ED Results / Procedures / Treatments   Labs (all labs ordered are listed, but only abnormal results are displayed) Labs Reviewed - No data to display  EKG None  Radiology No results found.  Procedures Procedures    Medications Ordered in ED Medications - No data to display  ED Course/ Medical Decision Making/ A&P Clinical Course as of 02/02/22 [phone]  [facility]  Feb 02, 2022  0205 EKG shows sinus arrhythmia at a rate of 86 with prolonged QT interval [AH]  0328 Brain natriuretic peptide(!) Bnp at baseline [AH]  0328 I-Stat arterial blood gas, ED(!) Ph shows resp alkalosis likely due to "comfort oxygen" placed by nursing staff [AH]  0328 CBC with Differential(!) [AH]  0712 Basic metabolic panel(!) Cbc and BMP without acute findings [AH]  0328 DG Chest 2 View I visualized the 2 v cxr - no acute findings [AH]  0432 Cbc and BMP without acute findings [AH]    Clinical Course User Index [AH] Margarita Mail, PA-C                           Medical Decision Making Patient here for productive cough. 25 visits to the RD in 6 mos. Differential diagnosis for emergent  cause of cough includes but is not limited to upper respiratory infection, lower respiratory infection, allergies, asthma, irritants, foreign body, medications such as ACE inhibitors, reflux, asthma, CHF, lung cancer, interstitial lung disease, psychiatric causes, postnasal drip and postinfectious bronchospasm.  Patient work up is reassuring. Patient has OP dialysis today.  NO evidence of CAP or volume overload.  No hypoxia. Safe for discharge   Amount and/or Complexity of Data Reviewed Labs: ordered. Decision-making details documented in ED Course. Radiology: ordered and independent interpretation performed. Decision-making details documented in ED Course.     Final Clinical Impression(s) / ED Diagnoses Final diagnoses:  None    Rx / DC Orders ED Discharge Orders     None         Margarita Mail, PA-C 02/02/22 0446    Quintella Reichert, MD 02/02/22 [phone]

## 2022-02-02 NOTE — Discharge Instructions (Signed)
Your work up shows no acute findings.  Please go to your scheduled dialysis today

## 2022-02-02 NOTE — ED Triage Notes (Signed)
Pt arrived by [facility] for sob. Initially denied any pain, however during triage, started c/o pain between her shoulder blades.Dialysis pt, MWF, last treatment on Friday. 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

## 2022-02-04 DIAGNOSIS — N2581 Secondary hyperparathyroidism of renal origin: Secondary | ICD-10-CM | POA: Diagnosis not present

## 2022-02-04 DIAGNOSIS — D631 Anemia in chronic kidney disease: Secondary | ICD-10-CM | POA: Diagnosis not present

## 2022-02-04 DIAGNOSIS — L299 Pruritus, unspecified: Secondary | ICD-10-CM | POA: Diagnosis not present

## 2022-02-04 DIAGNOSIS — R197 Diarrhea, unspecified: Secondary | ICD-10-CM | POA: Diagnosis not present

## 2022-02-04 DIAGNOSIS — Z992 Dependence on renal dialysis: Secondary | ICD-10-CM | POA: Diagnosis not present

## 2022-02-04 DIAGNOSIS — N186 End stage renal disease: Secondary | ICD-10-CM | POA: Diagnosis not present

## 2022-02-04 DIAGNOSIS — E875 Hyperkalemia: Secondary | ICD-10-CM | POA: Diagnosis not present

## 2022-02-04 DIAGNOSIS — R52 Pain, unspecified: Secondary | ICD-10-CM | POA: Diagnosis not present

## 2022-02-06 DIAGNOSIS — R197 Diarrhea, unspecified: Secondary | ICD-10-CM | POA: Diagnosis not present

## 2022-02-06 DIAGNOSIS — L299 Pruritus, unspecified: Secondary | ICD-10-CM | POA: Diagnosis not present

## 2022-02-06 DIAGNOSIS — E875 Hyperkalemia: Secondary | ICD-10-CM | POA: Diagnosis not present

## 2022-02-06 DIAGNOSIS — Z992 Dependence on renal dialysis: Secondary | ICD-10-CM | POA: Diagnosis not present

## 2022-02-06 DIAGNOSIS — D631 Anemia in chronic kidney disease: Secondary | ICD-10-CM | POA: Diagnosis not present

## 2022-02-06 DIAGNOSIS — R52 Pain, unspecified: Secondary | ICD-10-CM | POA: Diagnosis not present

## 2022-02-06 DIAGNOSIS — N186 End stage renal disease: Secondary | ICD-10-CM | POA: Diagnosis not present

## 2022-02-06 DIAGNOSIS — N2581 Secondary hyperparathyroidism of renal origin: Secondary | ICD-10-CM | POA: Diagnosis not present

## 2022-02-08 ENCOUNTER — Encounter (HOSPITAL_COMMUNITY): Payer: Self-pay

## 2022-02-08 ENCOUNTER — Emergency Department (HOSPITAL_COMMUNITY)
Admission: EM | Admit: 2022-02-08 | Discharge: 2022-02-08 | Disposition: A | Discharge status: Home / Self Care | Payer: Medicare Other | Attending: Emergency Medicine | Admitting: Emergency Medicine

## 2022-02-08 ENCOUNTER — Other Ambulatory Visit: Payer: Self-pay

## 2022-02-08 ENCOUNTER — Emergency Department (HOSPITAL_COMMUNITY): Payer: Medicare Other

## 2022-02-08 DIAGNOSIS — R0609 Other forms of dyspnea: Secondary | ICD-10-CM | POA: Diagnosis not present

## 2022-02-08 DIAGNOSIS — N186 End stage renal disease: Secondary | ICD-10-CM | POA: Diagnosis not present

## 2022-02-08 DIAGNOSIS — Z79899 Other long term (current) drug therapy: Secondary | ICD-10-CM | POA: Diagnosis not present

## 2022-02-08 DIAGNOSIS — F419 Anxiety disorder, unspecified: Secondary | ICD-10-CM | POA: Diagnosis not present

## 2022-02-08 DIAGNOSIS — Z7982 Long term (current) use of aspirin: Secondary | ICD-10-CM | POA: Insufficient documentation

## 2022-02-08 DIAGNOSIS — D649 Anemia, unspecified: Secondary | ICD-10-CM | POA: Diagnosis not present

## 2022-02-08 DIAGNOSIS — Z992 Dependence on renal dialysis: Secondary | ICD-10-CM | POA: Diagnosis not present

## 2022-02-08 DIAGNOSIS — L97529 Non-pressure chronic ulcer of other part of left foot with unspecified severity: Secondary | ICD-10-CM | POA: Diagnosis not present

## 2022-02-08 DIAGNOSIS — I509 Heart failure, unspecified: Secondary | ICD-10-CM | POA: Diagnosis not present

## 2022-02-08 DIAGNOSIS — I132 Hypertensive heart and chronic kidney disease with heart failure and with stage 5 chronic kidney disease, or end stage renal disease: Secondary | ICD-10-CM | POA: Insufficient documentation

## 2022-02-08 DIAGNOSIS — R0602 Shortness of breath: Secondary | ICD-10-CM | POA: Diagnosis not present

## 2022-02-08 DIAGNOSIS — I700 Atherosclerosis of aorta: Secondary | ICD-10-CM | POA: Diagnosis not present

## 2022-02-08 DIAGNOSIS — I517 Cardiomegaly: Secondary | ICD-10-CM | POA: Diagnosis not present

## 2022-02-08 DIAGNOSIS — R21 Rash and other nonspecific skin eruption: Secondary | ICD-10-CM | POA: Insufficient documentation

## 2022-02-08 LAB — BASIC METABOLIC PANEL
Anion gap: 16 — ABNORMAL HIGH (ref 5–15)
BUN: 36 mg/dL — ABNORMAL HIGH (ref 8–23)
CO2: 28 mmol/L (ref 22–32)
Calcium: 8.9 mg/dL (ref 8.9–10.3)
Chloride: 96 mmol/L — ABNORMAL LOW (ref 98–111)
Creatinine, Ser: 5.26 mg/dL — ABNORMAL HIGH (ref 0.44–1.00)
GFR, Estimated: 8 mL/min — ABNORMAL LOW (ref >60)
Glucose, Bld: 128 mg/dL — ABNORMAL HIGH (ref 70–99)
Potassium: 4 mmol/L (ref 3.5–5.1)
Sodium: 140 mmol/L (ref 135–145)

## 2022-02-08 LAB — CBC WITH DIFFERENTIAL/PLATELET
Abs Immature Granulocytes: 0.02 10*3/uL (ref 0.00–0.07)
Basophils Absolute: 0 10*3/uL (ref 0.0–0.1)
Basophils Relative: 1 %
Eosinophils Absolute: 0.4 10*3/uL (ref 0.0–0.5)
Eosinophils Relative: 8 %
HCT: 37.3 % (ref 36.0–46.0)
Hemoglobin: 11.9 g/dL — ABNORMAL LOW (ref 12.0–15.0)
Immature Granulocytes: 0 %
Lymphocytes Relative: 27 %
Lymphs Abs: 1.5 10*3/uL (ref 0.7–4.0)
MCH: 31.4 pg (ref 26.0–34.0)
MCHC: 31.9 g/dL (ref 30.0–36.0)
MCV: 98.4 fL (ref 80.0–100.0)
Monocytes Absolute: 0.8 10*3/uL (ref 0.1–1.0)
Monocytes Relative: 14 %
Neutro Abs: 2.9 10*3/uL (ref 1.7–7.7)
Neutrophils Relative %: 50 %
Platelets: 156 10*3/uL (ref 150–400)
RBC: 3.79 MIL/uL — ABNORMAL LOW (ref 3.87–5.11)
RDW: 21 % — ABNORMAL HIGH (ref 11.5–15.5)
WBC: 5.7 10*3/uL (ref 4.0–10.5)
nRBC: 0 % (ref 0.0–0.2)

## 2022-02-08 NOTE — ED Provider Notes (Signed)
[facility] EMERGENCY DEPARTMENT Provider Note   CSN: [id number] Arrival date & time: 02/08/22  0554     History  Chief Complaint  Patient presents with   Shortness of Breath    [name] is a 71 y.o. female.  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  Pt indicates that she woke up around 5 am with shortness of breath. No associated CP. No n/v/f/c. Pt denies any bleeding.  She is feeling a lot better. Denies any wheezing. Unsure what made her feel better.  Home Medications Prior to Admission medications   Medication Sig Start Date End Date Taking? Authorizing Provider  acetaminophen (TYLENOL) 500 MG tablet Take 1 tablet (500 mg total) by mouth every 6 (six) hours as needed. Patient taking differently: Take 500 mg by mouth every 6 (six) hours as needed for moderate pain or headache. 09/05/18   Law, Bea Graff, PA-C  acidophilus (RISAQUAD) CAPS capsule Take 2 capsules by mouth 3 (three) times daily. 01/24/22   Swayze, Ava, DO  albuterol (VENTOLIN HFA) 108 (90 Base) MCG/ACT inhaler Inhale 1-2 puffs into the lungs every 6 (six) hours as needed for wheezing or shortness of breath. 01/11/22   Mesner, Corene Cornea, MD  allopurinol (ZYLOPRIM) 300 MG tablet Take 150 mg by mouth daily. 11/03/21   [provider]  ALPRAZolam Duanne Moron) 0.25 MG tablet Take 1 tablet (0.25 mg total) by mouth daily as needed for anxiety. 08/18/21   Nita Sells, MD  aspirin EC 81 MG tablet Take 81 mg by mouth at bedtime.    [provider]  atorvastatin (LIPITOR) 40 MG tablet Take 1 tablet (40 mg total) by mouth daily. 01/13/22   Burnell Blanks, MD  cinacalcet (SENSIPAR) 30 MG tablet Take 30 mg by mouth 3 (three) times a week. Mon, Wed, Fri-- Given @ Dialysis 01/04/20   [provider]  feeding supplement (ENSURE ENLIVE / ENSURE PLUS) LIQD Take 237 mLs by mouth 2  (two) times daily between meals. 01/24/22   Swayze, Ava, DO  folic acid-vitamin b complex-vitamin c-selenium-zinc (DIALYVITE) 3 MG TABS tablet Take 1 tablet by mouth daily.    [provider]  metoprolol tartrate (LOPRESSOR) 25 MG tablet Take 0.5 tablets (12.5 mg total) by mouth 2 (two) times daily. 01/24/22   Swayze, Ava, DO  ondansetron (ZOFRAN) 8 MG tablet Take 8 mg by mouth 2 (two) times daily as needed for nausea or vomiting.     [provider]  pantoprazole (PROTONIX) 40 MG tablet Take 1 tablet (40 mg total) by mouth daily. 08/18/21   Nita Sells, MD  polyethylene glycol (MIRALAX / GLYCOLAX) 17 g packet Take 17 g by mouth 2 (two) times daily. 01/24/22   Swayze, Ava, DO      Allergies    Sulfa antibiotics and Adhesive [tape]    Review of Systems   Review of Systems  Constitutional:  Positive for activity change.  Respiratory:  Positive for shortness of breath.   All other systems reviewed and are negative.  Physical Exam Updated Vital Signs BP (!) 111/93    Pulse 81    Temp 97.9 F (36.6 C) (Oral)    Resp (!) 29    Ht 5\' 2"  (1.575 m)    Wt 49.4 kg    SpO2 97%    BMI 19.94 kg/m  Physical Exam Vitals and nursing note  reviewed.  Constitutional:      Appearance: She is well-developed.  HENT:     Head: Atraumatic.  Cardiovascular:     Rate and Rhythm: Normal rate.  Pulmonary:     Effort: Pulmonary effort is normal.  Musculoskeletal:     Cervical back: Neck supple.     Right lower leg: No edema.     Left lower leg: No edema.  Skin:    General: Skin is warm and dry.     Findings: Rash present.     Comments: Bilateral lower extremity erythema, no warmth. There is an ulcer on the L foot. No signs of abscess  Neurological:     Mental Status: She is alert and oriented to person, place, and time.    ED Results / Procedures / Treatments   Labs (all labs ordered are listed, but only abnormal results are displayed) Labs Reviewed  BASIC METABOLIC PANEL -  Abnormal; Notable for the following components:      Result Value   Chloride 96 (*)    Glucose, Bld 128 (*)    BUN 36 (*)    Creatinine, Ser 5.26 (*)    GFR, Estimated 8 (*)    Anion gap 16 (*)    All other components within normal limits  CBC WITH DIFFERENTIAL/PLATELET - Abnormal; Notable for the following components:   RBC 3.79 (*)    Hemoglobin 11.9 (*)    RDW 21.0 (*)    All other components within normal limits    EKG EKG Interpretation  Date/Time:  Sunday February 08 2022 05:58:59 EST Ventricular Rate:  77 PR Interval:  191 QRS Duration: 104 QT Interval:  445 QTC Calculation: 504 R Axis:   86 Text Interpretation: Sinus rhythm Borderline right axis deviation RSR' in V1 or V2, probably normal variant Borderline T abnormalities, anterior leads Prolonged QT interval Confirmed by Quintella Reichert [phone]) on 02/08/2022 6:20:20 AM  Radiology DG Chest Port 1 View  Result Date: 02/08/2022 CLINICAL DATA:  71 year old female with history of shortness of breath. EXAM: PORTABLE CHEST 1 VIEW COMPARISON:  Chest x-ray 02/02/2022. FINDINGS: Lung volumes are normal. No consolidative airspace disease. No pleural effusions. No pneumothorax. No evidence of pulmonary edema. Heart size is moderately enlarged. Upper mediastinal contours are within normal limits. Atherosclerotic calcifications are noted in the thoracic aorta. Orthopedic fixation hardware in the cervical spine incidentally noted. IMPRESSION: 1. No radiographic evidence of acute cardiopulmonary disease. 2. Moderate cardiomegaly, similar to the prior examination. 3. Aortic atherosclerosis. Electronically Signed   By: Vinnie Langton M.D.   On: 02/08/2022 06:21    Procedures Procedures    Medications Ordered in ED Medications - No data to display  ED Course/ Medical Decision Making/ A&P Clinical Course as of 02/08/22 6734  Sun Feb 08, 2022  0924 DG Chest [place] 1 View X-ray visualized independently for evaluation of pulmonary  edema, pleural effusion -and it is negative for it. [AN]  0924 Hemoglobin(!): 11.9 Baseline anemia. [AN]  1937 Basic metabolic panel(!) No concerning hyperkalemia or uremia [AN]    Clinical Course User Index [AN] Varney Biles, MD                           Medical Decision Making Amount and/or Complexity of Data Reviewed Labs: ordered.   This patient presents to the ED with chief complaint(s) of shortness of with pertinent past medical history of ESRD on hemodialysis, CHF, CAD and stroke.  which further  complicates the presenting complaint. The complaint involves an extensive differential diagnosis and treatment options and also carries with it a high risk of complications and morbidity.  Patient also has history of some anxiety and she indicates that she is up-to-date with her hemodialysis.  The differential diagnosis includes  -CHF exacerbation/orthopnea, volume overload, pulmonary edema, tachydysrhythmia, severe anemia.  The initial plan is to get CBC and basic metabolic profile with chest x-ray and EKG. patient appears comfortable at this time.  There is no respiratory distress, tachypnea or hypoxia.  She is noted to have bilateral feet erythema.  Indicates that her pain in the leg has been present for weeks and she is seeing a podiatrist on 2-28.  She has a small ulcer to the left foot, that is not draining anything.  No clinical concerns for sepsis or deep space infection of the foot that needs emergent assessment.  No concerns for limb ischemia -but given her vasculopathy and prior history of heavy smoking, she might need vascular evaluation as an outpatient.  Will defer evaluation and management of the foot to the outpatient team as that is not her primary concern nor is there any emergent finding on our visit.   Additional history obtained: Additional history obtained from family  -spoke with patient's sister, who indicates that patient frequently gets "panicky", and that patient  has not been ill recently Records reviewed previous admission documents      Final Clinical Impression(s) / ED Diagnoses Final diagnoses:  Other form of dyspnea  Anxiety    Rx / DC Orders ED Discharge Orders     None         Varney Biles, MD 02/08/22 [phone]

## 2022-02-08 NOTE — Discharge Instructions (Addendum)
X-ray shows no evidence of volume overload.  Continue with your dialysis as planned tomorrow.  Continue with your podiatry follow-up as planned for 28th.

## 2022-02-08 NOTE — ED Triage Notes (Signed)
Pt bib [facility] from home c/o sob that started when she woke up around 5 am. RR even and unlabored, lung sounds clear per ems, O2 97% RA. Dialysis MWF. BP 145/66, HR 78.

## 2022-02-09 DIAGNOSIS — R197 Diarrhea, unspecified: Secondary | ICD-10-CM | POA: Diagnosis not present

## 2022-02-09 DIAGNOSIS — N186 End stage renal disease: Secondary | ICD-10-CM | POA: Diagnosis not present

## 2022-02-09 DIAGNOSIS — D631 Anemia in chronic kidney disease: Secondary | ICD-10-CM | POA: Diagnosis not present

## 2022-02-09 DIAGNOSIS — Z992 Dependence on renal dialysis: Secondary | ICD-10-CM | POA: Diagnosis not present

## 2022-02-09 DIAGNOSIS — N2581 Secondary hyperparathyroidism of renal origin: Secondary | ICD-10-CM | POA: Diagnosis not present

## 2022-02-09 DIAGNOSIS — R52 Pain, unspecified: Secondary | ICD-10-CM | POA: Diagnosis not present

## 2022-02-09 DIAGNOSIS — L299 Pruritus, unspecified: Secondary | ICD-10-CM | POA: Diagnosis not present

## 2022-02-09 DIAGNOSIS — E875 Hyperkalemia: Secondary | ICD-10-CM | POA: Diagnosis not present

## 2022-02-10 ENCOUNTER — Ambulatory Visit: Payer: Medicare Other | Admitting: Podiatry

## 2022-02-10 DIAGNOSIS — Z992 Dependence on renal dialysis: Secondary | ICD-10-CM | POA: Diagnosis not present

## 2022-02-10 DIAGNOSIS — N186 End stage renal disease: Secondary | ICD-10-CM | POA: Diagnosis not present

## 2022-02-10 DIAGNOSIS — T8612 Kidney transplant failure: Secondary | ICD-10-CM | POA: Diagnosis not present

## 2022-02-11 DIAGNOSIS — R52 Pain, unspecified: Secondary | ICD-10-CM | POA: Diagnosis not present

## 2022-02-11 DIAGNOSIS — R197 Diarrhea, unspecified: Secondary | ICD-10-CM | POA: Diagnosis not present

## 2022-02-11 DIAGNOSIS — T782XXA Anaphylactic shock, unspecified, initial encounter: Secondary | ICD-10-CM | POA: Diagnosis not present

## 2022-02-11 DIAGNOSIS — D631 Anemia in chronic kidney disease: Secondary | ICD-10-CM | POA: Diagnosis not present

## 2022-02-11 DIAGNOSIS — N2581 Secondary hyperparathyroidism of renal origin: Secondary | ICD-10-CM | POA: Diagnosis not present

## 2022-02-11 DIAGNOSIS — Z992 Dependence on renal dialysis: Secondary | ICD-10-CM | POA: Diagnosis not present

## 2022-02-11 DIAGNOSIS — L299 Pruritus, unspecified: Secondary | ICD-10-CM | POA: Diagnosis not present

## 2022-02-11 DIAGNOSIS — N186 End stage renal disease: Secondary | ICD-10-CM | POA: Diagnosis not present

## 2022-02-13 DIAGNOSIS — L299 Pruritus, unspecified: Secondary | ICD-10-CM | POA: Diagnosis not present

## 2022-02-13 DIAGNOSIS — N186 End stage renal disease: Secondary | ICD-10-CM | POA: Diagnosis not present

## 2022-02-13 DIAGNOSIS — R52 Pain, unspecified: Secondary | ICD-10-CM | POA: Diagnosis not present

## 2022-02-13 DIAGNOSIS — Z992 Dependence on renal dialysis: Secondary | ICD-10-CM | POA: Diagnosis not present

## 2022-02-13 DIAGNOSIS — D631 Anemia in chronic kidney disease: Secondary | ICD-10-CM | POA: Diagnosis not present

## 2022-02-13 DIAGNOSIS — N2581 Secondary hyperparathyroidism of renal origin: Secondary | ICD-10-CM | POA: Diagnosis not present

## 2022-02-13 DIAGNOSIS — T782XXA Anaphylactic shock, unspecified, initial encounter: Secondary | ICD-10-CM | POA: Diagnosis not present

## 2022-02-13 DIAGNOSIS — R197 Diarrhea, unspecified: Secondary | ICD-10-CM | POA: Diagnosis not present

## 2022-02-13 IMAGING — DX DG CHEST 2V
2 series · 2 of 2 positions shown · non-contrast
Comparison: 05/18/2021

CLINICAL DATA: Short of breath

EXAM:
CHEST - 2 VIEW

[w chest pa]
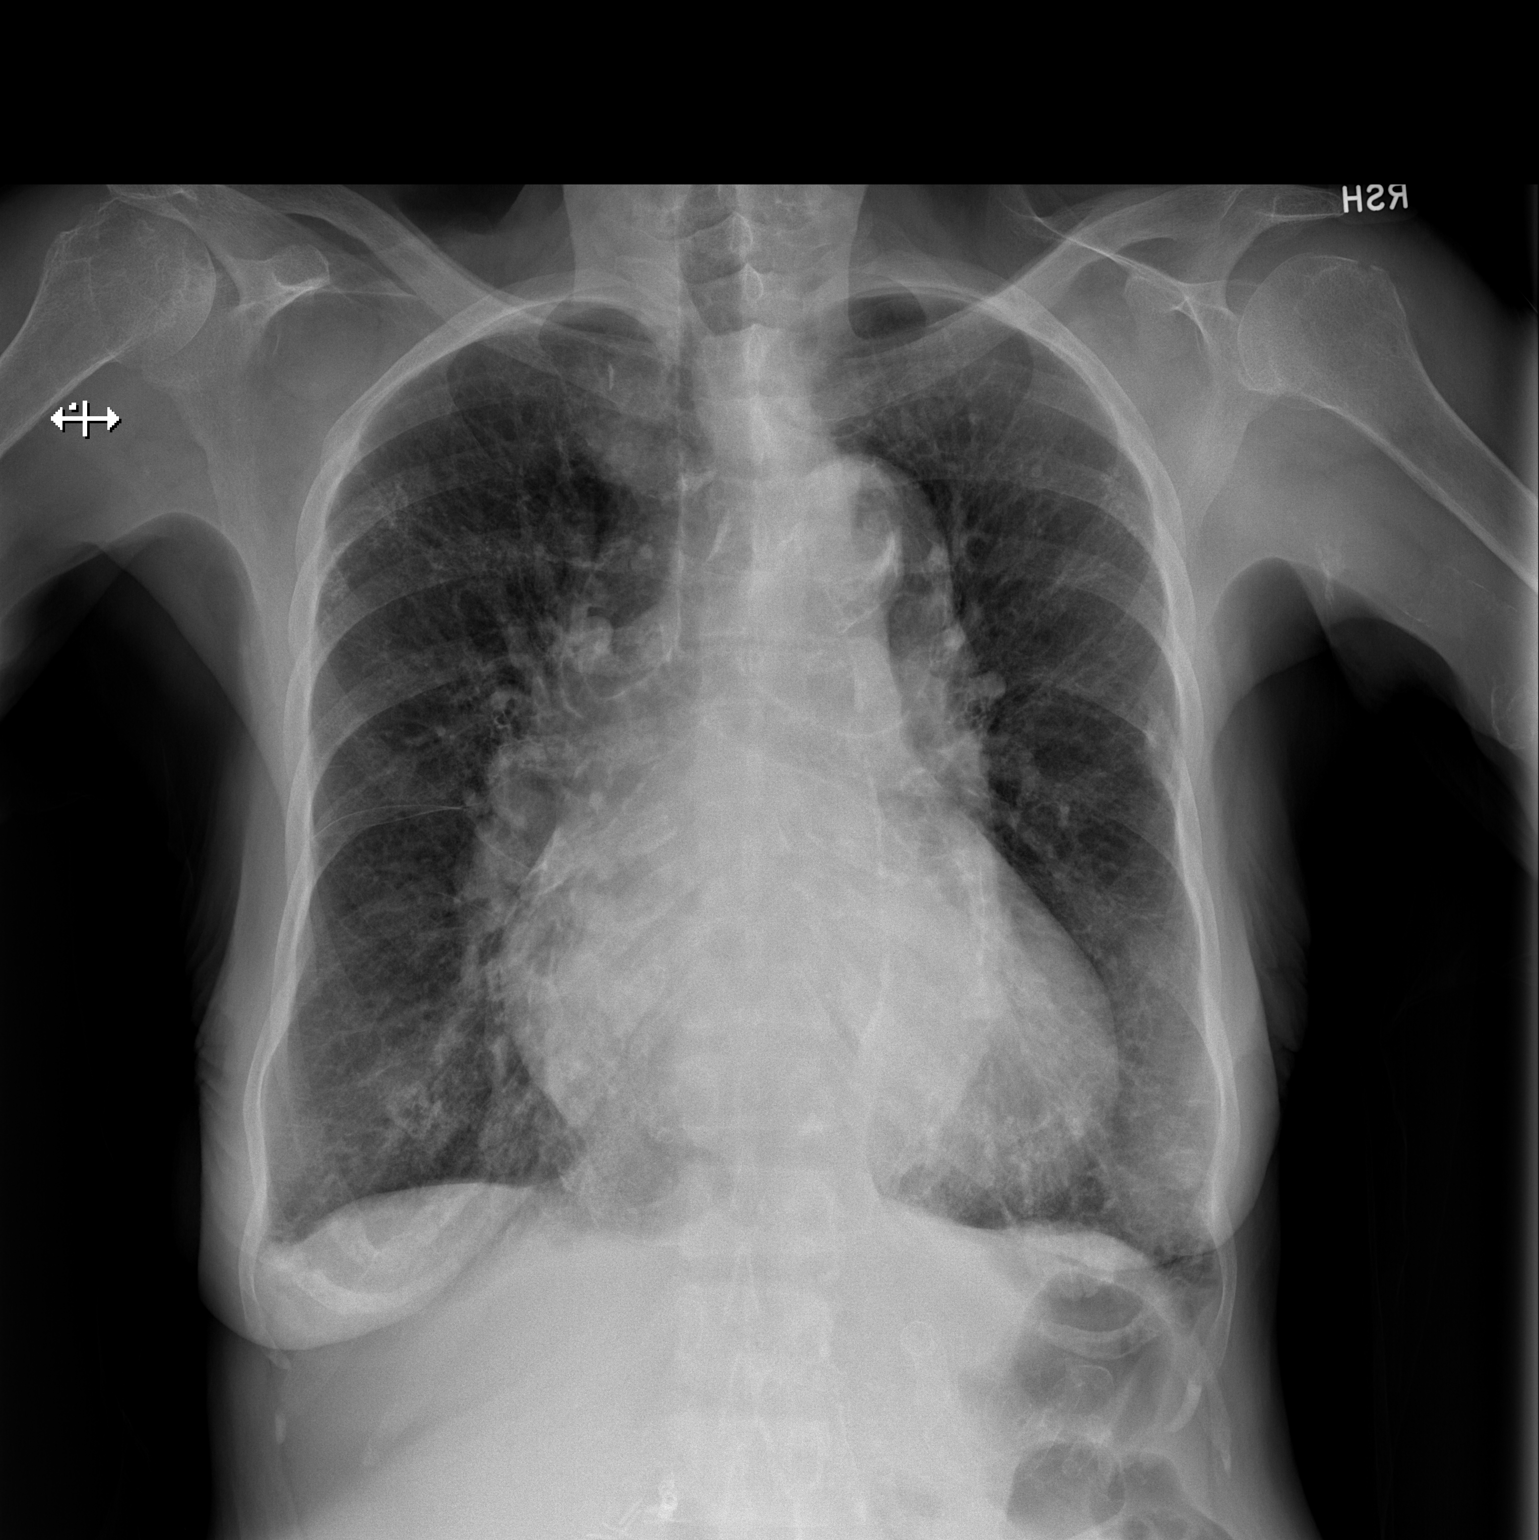

[w chest lat]
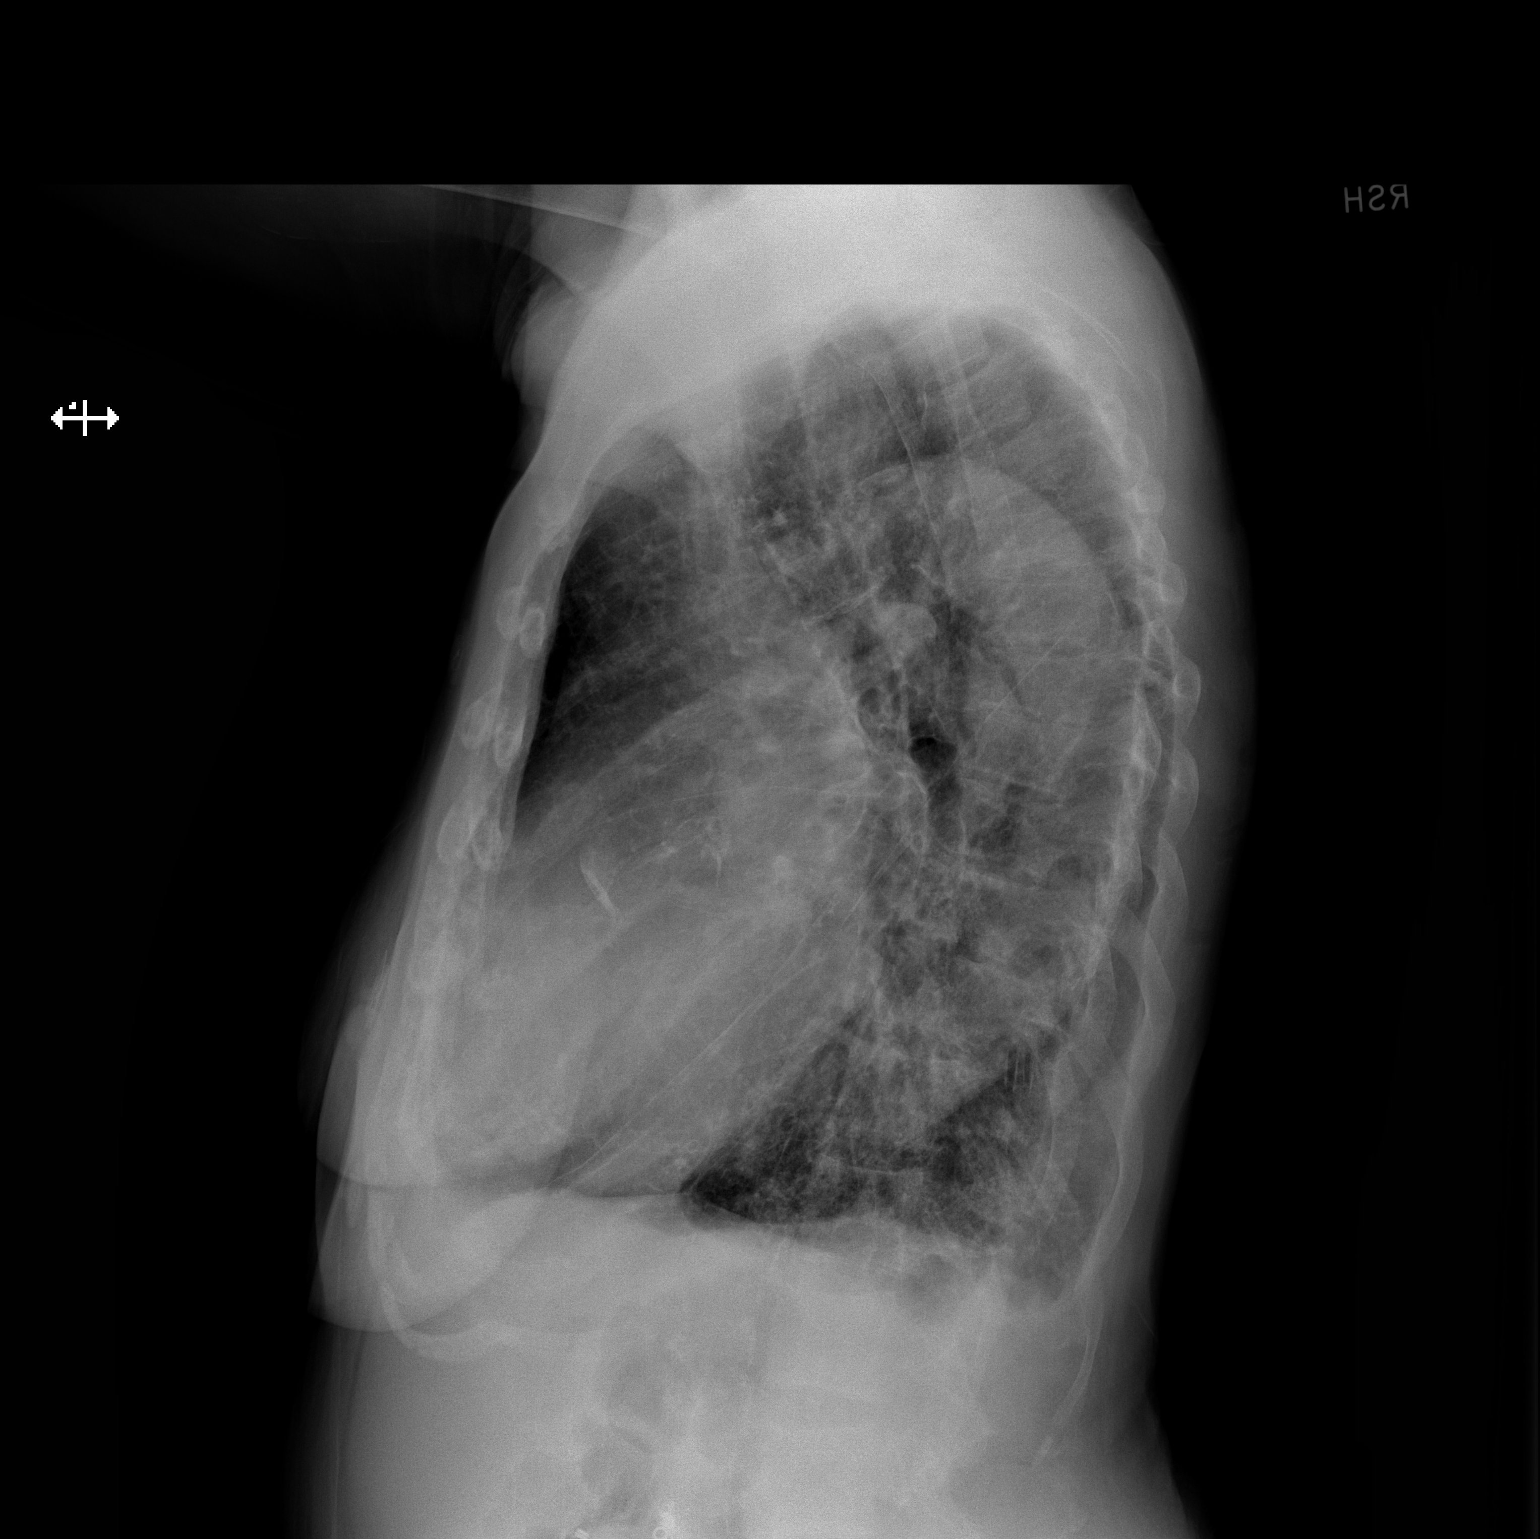

[2 of 2 positions shown; findings below may reference images not displayed]

FINDINGS: Cardiomegaly. Aortic atherosclerosis. There is hyperinflation of the
lungs compatible with COPD. Chronic scarring in the lungs. No acute
confluent opacities or effusions. No acute bony abnormality.
IMPRESSION: COPD/chronic changes.

Cardiomegaly, aortic atherosclerosis.

No acute findings.

## 2022-02-16 DIAGNOSIS — L299 Pruritus, unspecified: Secondary | ICD-10-CM | POA: Diagnosis not present

## 2022-02-16 DIAGNOSIS — T782XXA Anaphylactic shock, unspecified, initial encounter: Secondary | ICD-10-CM | POA: Diagnosis not present

## 2022-02-16 DIAGNOSIS — R197 Diarrhea, unspecified: Secondary | ICD-10-CM | POA: Diagnosis not present

## 2022-02-16 DIAGNOSIS — N186 End stage renal disease: Secondary | ICD-10-CM | POA: Diagnosis not present

## 2022-02-16 DIAGNOSIS — D631 Anemia in chronic kidney disease: Secondary | ICD-10-CM | POA: Diagnosis not present

## 2022-02-16 DIAGNOSIS — Z992 Dependence on renal dialysis: Secondary | ICD-10-CM | POA: Diagnosis not present

## 2022-02-16 DIAGNOSIS — N2581 Secondary hyperparathyroidism of renal origin: Secondary | ICD-10-CM | POA: Diagnosis not present

## 2022-02-16 DIAGNOSIS — R52 Pain, unspecified: Secondary | ICD-10-CM | POA: Diagnosis not present

## 2022-02-18 DIAGNOSIS — L299 Pruritus, unspecified: Secondary | ICD-10-CM | POA: Diagnosis not present

## 2022-02-18 DIAGNOSIS — R52 Pain, unspecified: Secondary | ICD-10-CM | POA: Diagnosis not present

## 2022-02-18 DIAGNOSIS — Z992 Dependence on renal dialysis: Secondary | ICD-10-CM | POA: Diagnosis not present

## 2022-02-18 DIAGNOSIS — T782XXA Anaphylactic shock, unspecified, initial encounter: Secondary | ICD-10-CM | POA: Diagnosis not present

## 2022-02-18 DIAGNOSIS — N2581 Secondary hyperparathyroidism of renal origin: Secondary | ICD-10-CM | POA: Diagnosis not present

## 2022-02-18 DIAGNOSIS — N186 End stage renal disease: Secondary | ICD-10-CM | POA: Diagnosis not present

## 2022-02-18 DIAGNOSIS — R197 Diarrhea, unspecified: Secondary | ICD-10-CM | POA: Diagnosis not present

## 2022-02-18 DIAGNOSIS — D631 Anemia in chronic kidney disease: Secondary | ICD-10-CM | POA: Diagnosis not present

## 2022-02-19 ENCOUNTER — Ambulatory Visit: Payer: Medicare Other | Admitting: Podiatry

## 2022-02-19 ENCOUNTER — Encounter: Payer: Self-pay | Admitting: Podiatry

## 2022-02-19 ENCOUNTER — Other Ambulatory Visit: Payer: Self-pay

## 2022-02-19 DIAGNOSIS — R1310 Dysphagia, unspecified: Secondary | ICD-10-CM | POA: Insufficient documentation

## 2022-02-19 DIAGNOSIS — R0989 Other specified symptoms and signs involving the circulatory and respiratory systems: Secondary | ICD-10-CM | POA: Diagnosis not present

## 2022-02-19 DIAGNOSIS — L97521 Non-pressure chronic ulcer of other part of left foot limited to breakdown of skin: Secondary | ICD-10-CM | POA: Diagnosis not present

## 2022-02-19 DIAGNOSIS — G3184 Mild cognitive impairment, so stated: Secondary | ICD-10-CM | POA: Insufficient documentation

## 2022-02-19 DIAGNOSIS — D229 Melanocytic nevi, unspecified: Secondary | ICD-10-CM

## 2022-02-19 DIAGNOSIS — I770 Arteriovenous fistula, acquired: Secondary | ICD-10-CM | POA: Insufficient documentation

## 2022-02-19 DIAGNOSIS — D631 Anemia in chronic kidney disease: Secondary | ICD-10-CM | POA: Insufficient documentation

## 2022-02-19 DIAGNOSIS — I132 Hypertensive heart and chronic kidney disease with heart failure and with stage 5 chronic kidney disease, or end stage renal disease: Secondary | ICD-10-CM | POA: Insufficient documentation

## 2022-02-19 DIAGNOSIS — R1115 Cyclical vomiting syndrome unrelated to migraine: Secondary | ICD-10-CM | POA: Insufficient documentation

## 2022-02-19 DIAGNOSIS — F329 Major depressive disorder, single episode, unspecified: Secondary | ICD-10-CM | POA: Insufficient documentation

## 2022-02-19 NOTE — Progress Notes (Signed)
Subjective:  Patient ID: [name], female    DOB: 1951-04-30,  MRN: [id number] HPI Chief Complaint  Patient presents with   Foot Ulcer    Dorsal forefoot left - wound x 1 year, has been on antibiotics before, but not currently, on dialysis, feet red and swollen   New Patient (Initial Visit)    71 y.o. female presents with the above complaint.   ROS: Denies fever chills nausea vomiting muscle aches pains calf pain back pain chest pain shortness of breath.  Past Medical History:  Diagnosis Date   Adenomatous polyp of colon 10/2010, 2006, 2015   Anemia in CKD (chronic kidney disease) 11/07/2012   s/p blood transfusion.    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  Had some early rejection then stable function for years, then had slow decline of function and went back on hemodialysis in 2012.  Gets HD TTS schedule at [facility] on [facility] still using L forearm AVF.      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HCC)    left upper arm   Pulmonary edema 12/2019   Stroke (HCC) 11/2015   TIA   Weight loss, unintentional    Past Surgical History:  Procedure Laterality Date   AV FISTULA PLACEMENT     for dialysis   AV FISTULA PLACEMENT Left 11/22/2015   Procedure: ARTERIOVENOUS (AV) FISTULA CREATION-LEFT BRACHIOCEPHALIC;  Surgeon: Nada Libman, MD;  Location: [facility] OR;  Service: Vascular;  Laterality: Left;   AV FISTULA PLACEMENT Right 03/15/2020    Procedure: INSERTION OF ARTERIOVENOUS (AV) GORE-TEX GRAFT ARM ( BRACHIAL AXILLARY );  Surgeon: Renford Dills, MD;  Location: [facility] ORS;  Service: Vascular;  Laterality: Right;   BACK SURGERY     CERVICAL FUSION     CHOLECYSTECTOMY  12/02/2012   Procedure: LAPAROSCOPIC CHOLECYSTECTOMY WITH INTRAOPERATIVE CHOLANGIOGRAM;  Surgeon: Mariella Saa, MD;  Location: [facility] OR;  Service: General;  Laterality: N/A;   EYE SURGERY Bilateral    cataract surgery   EYE SURGERY Left 2019   laser   HEMATOMA EVACUATION Left 12/24/2016   Procedure: EVACUATION HEMATOMA LEFT UPPER ARM;  Surgeon: Maeola Harman, MD;  Location: [facility] OR;  Service: Vascular;  Laterality: Left;   I & D EXTREMITY Left 12/31/2016   Procedure: IRRIGATION AND DEBRIDEMENT EXTREMITY;  Surgeon: Chuck Hint, MD;  Location: [facility] OR;  Service: Vascular;  Laterality: Left;   INSERTION OF DIALYSIS CATHETER Right 12/24/2016   Procedure: INSERTION OF DIALYSIS CATHETER;  Surgeon: Maeola Harman, MD;  Location: [facility];  Service: Vascular;  Laterality: Right;   INSERTION OF DIALYSIS CATHETER Right 02/04/2017   Procedure: INSERTION OF DIALYSIS CATHETER;  Surgeon: Maeola Harman, MD;  Location: [facility] OR;  Service: Vascular;  Laterality: Right;   KIDNEY TRANSPLANT  1996   PERIPHERAL VASCULAR CATHETERIZATION  Left 10/23/2016   Procedure: Fistulagram;  Surgeon: Sherren Kerns, MD;  Location: [facility] INVASIVE CV LAB;  Service: Cardiovascular;  Laterality: Left;   PERIPHERAL VASCULAR THROMBECTOMY Right 04/16/2020   Procedure: PERIPHERAL VASCULAR THROMBECTOMY;  Surgeon: Renford Dills, MD;  Location: [facility] INVASIVE CV LAB;  Service: Cardiovascular;  Laterality: Right;   RESECTION OF ARTERIOVENOUS FISTULA ANEURYSM Left 11/22/2015   Procedure: RESECTION OF LEFT RADIOCEPHALIC FISTULA ANEURYSM ;  Surgeon: Nada Libman, MD;  Location: [facility] OR;  Service: Vascular;  Laterality: Left;   REVISON OF ARTERIOVENOUS FISTULA Left 12/22/2016    Procedure: REVISON OF LEFT ARTERIOVENOUS FISTULA;  Surgeon: Maeola Harman, MD;  Location: [facility] OR;  Service: Vascular;  Laterality: Left;   REVISON OF ARTERIOVENOUS FISTULA Left 02/04/2017   Procedure: REVISON OF LEFT UPPER ARM ARTERIOVENOUS FISTULA;  Surgeon: Maeola Harman, MD;  [facility];  Service: Vascular;  Laterality: Left;   UPPER EXTREMITY ANGIOGRAPHY Bilateral 09/19/2019   Procedure: UPPER EXTREMITY ANGIOGRAPHY;  Surgeon: Renford Dills, MD;  Location: [facility] INVASIVE CV LAB;  Service: Cardiovascular;  Laterality: Bilateral;    Current Outpatient Medications:    acetaminophen (TYLENOL) 500 MG tablet, Take 1 tablet (500 mg total) by mouth every 6 (six) hours as needed. (Patient taking differently: Take 500 mg by mouth every 6 (six) hours as needed for moderate pain or headache.), Disp: 30 tablet, Rfl: 0   acidophilus (RISAQUAD) CAPS capsule, Take 2 capsules by mouth 3 (three) times daily., Disp: 180 capsule, Rfl: 0   albuterol (VENTOLIN HFA) 108 (90 Base) MCG/ACT inhaler, Inhale 1-2 puffs into the lungs every 6 (six) hours as needed for wheezing or shortness of breath., Disp: 1 each, Rfl: 0   allopurinol (ZYLOPRIM) 300 MG tablet, Take 150 mg by mouth daily., Disp: , Rfl:    ALPRAZolam (XANAX) 0.25 MG tablet, Take 1 tablet (0.25 mg total) by mouth daily as needed for anxiety., Disp: 10 tablet, Rfl: 0   aspirin EC 81 MG tablet, Take 81 mg by mouth at bedtime., Disp: , Rfl:    atorvastatin (LIPITOR) 40 MG tablet, Take 1 tablet (40 mg total) by mouth daily., Disp: 90 tablet, Rfl: 3   B Complex-C-Folic Acid (DIALYVITE 800) 0.8 MG WAFR, Take 1 tablet by mouth daily., Disp: , Rfl:    cinacalcet (SENSIPAR) 30 MG tablet, Take 30 mg by mouth 3 (three) times a week. Mon, Wed, Fri-- Given @ Dialysis, Disp: , Rfl:    feeding supplement (ENSURE ENLIVE / ENSURE PLUS) LIQD, Take 237 mLs by mouth 2 (two) times daily between meals., Disp: 237 mL, Rfl: 12   folic acid-vitamin b  complex-vitamin c-selenium-zinc (DIALYVITE) 3 MG TABS tablet, Take 1 tablet by mouth daily., Disp: , Rfl:    metoprolol tartrate (LOPRESSOR) 25 MG tablet, Take 0.5 tablets (12.5 mg total) by mouth 2 (two) times daily., Disp: 60 tablet, Rfl: 0   ondansetron (ZOFRAN) 8 MG tablet, Take 8 mg by mouth 2 (two) times daily as needed for nausea or vomiting. , Disp: , Rfl: 0   pantoprazole (PROTONIX) 40 MG tablet, Take 1 tablet (40 mg total) by mouth daily., Disp: 60 tablet, Rfl:    polyethylene glycol (MIRALAX / GLYCOLAX) 17 g packet, Take 17 g by mouth 2 (two) times daily., Disp: 14 each, Rfl: 0   zolpidem (AMBIEN) 10 MG tablet, Take 10 mg by mouth at bedtime as needed., Disp: , Rfl:   Allergies  Allergen Reactions   Sulfa Antibiotics Other (See Comments)  Per patient, both parents allergic-so will not take   Adhesive [Tape] Itching   Review of Systems Objective:  There were no vitals filed for this visit.  General: Well developed, nourished, in no acute distress, alert and oriented x3   Dermatological: Skin is warm, dry and supple bilateral. Nails x 10 are well maintained; remaining integument appears unremarkable at this time. There are no open sores, no preulcerative lesions, no rash or signs of infection present.  There is a chronic ulceration measures about a centimeter in diameter to the dorsal lateral aspect of the left foot with some mild surrounding erythematous rim.  There is a gray fibrous material within the lesion and the lesion is exquisitely tender.  Currently does not appear to be cellulitic though both feet are extremely erythematous.  Vascular: Dorsalis Pedis artery and Posterior Tibial artery pedal pulses are 0/4 bilateral with delayed capillary fill time. Pedal hair growth present. No varicosities and no lower extremity edema present bilateral.  Both feet are erythematous and cold to the touch.  Neruologic: Grossly intact via light touch bilateral. Vibratory intact via tuning  fork bilateral. Protective threshold with Semmes Wienstein monofilament intact to all pedal sites bilateral. Patellar and Achilles deep tendon reflexes 2+ bilateral. No Babinski or clonus noted bilateral.   Musculoskeletal: No gross boney pedal deformities bilateral. No pain, crepitus, or limitation noted with foot and ankle range of motion bilateral. Muscular strength 5/5 in all groups tested bilateral.  Gait: Unassisted, Nonantalgic.    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  She also has a new melanocytic lesion to the lateral aspect of the right leg.  This is approximately out by the head of the fibula.  Plan: Refer her for evaluation and treatment of vascular on [employer].  Also referred her to dermatology [facility].     [name], [place]

## 2022-02-20 DIAGNOSIS — D631 Anemia in chronic kidney disease: Secondary | ICD-10-CM | POA: Diagnosis not present

## 2022-02-20 DIAGNOSIS — Z992 Dependence on renal dialysis: Secondary | ICD-10-CM | POA: Diagnosis not present

## 2022-02-20 DIAGNOSIS — L299 Pruritus, unspecified: Secondary | ICD-10-CM | POA: Diagnosis not present

## 2022-02-20 DIAGNOSIS — N2581 Secondary hyperparathyroidism of renal origin: Secondary | ICD-10-CM | POA: Diagnosis not present

## 2022-02-20 DIAGNOSIS — R197 Diarrhea, unspecified: Secondary | ICD-10-CM | POA: Diagnosis not present

## 2022-02-20 DIAGNOSIS — T782XXA Anaphylactic shock, unspecified, initial encounter: Secondary | ICD-10-CM | POA: Diagnosis not present

## 2022-02-20 DIAGNOSIS — R52 Pain, unspecified: Secondary | ICD-10-CM | POA: Diagnosis not present

## 2022-02-20 DIAGNOSIS — N186 End stage renal disease: Secondary | ICD-10-CM | POA: Diagnosis not present

## 2022-02-23 DIAGNOSIS — N186 End stage renal disease: Secondary | ICD-10-CM | POA: Diagnosis not present

## 2022-02-23 DIAGNOSIS — Z992 Dependence on renal dialysis: Secondary | ICD-10-CM | POA: Diagnosis not present

## 2022-02-23 DIAGNOSIS — T782XXA Anaphylactic shock, unspecified, initial encounter: Secondary | ICD-10-CM | POA: Diagnosis not present

## 2022-02-23 DIAGNOSIS — R197 Diarrhea, unspecified: Secondary | ICD-10-CM | POA: Diagnosis not present

## 2022-02-23 DIAGNOSIS — R52 Pain, unspecified: Secondary | ICD-10-CM | POA: Diagnosis not present

## 2022-02-23 DIAGNOSIS — L299 Pruritus, unspecified: Secondary | ICD-10-CM | POA: Diagnosis not present

## 2022-02-23 DIAGNOSIS — D631 Anemia in chronic kidney disease: Secondary | ICD-10-CM | POA: Diagnosis not present

## 2022-02-23 DIAGNOSIS — N2581 Secondary hyperparathyroidism of renal origin: Secondary | ICD-10-CM | POA: Diagnosis not present

## 2022-02-24 DIAGNOSIS — R52 Pain, unspecified: Secondary | ICD-10-CM | POA: Diagnosis not present

## 2022-02-24 DIAGNOSIS — D631 Anemia in chronic kidney disease: Secondary | ICD-10-CM | POA: Diagnosis not present

## 2022-02-24 DIAGNOSIS — R197 Diarrhea, unspecified: Secondary | ICD-10-CM | POA: Diagnosis not present

## 2022-02-24 DIAGNOSIS — L299 Pruritus, unspecified: Secondary | ICD-10-CM | POA: Diagnosis not present

## 2022-02-24 DIAGNOSIS — T782XXA Anaphylactic shock, unspecified, initial encounter: Secondary | ICD-10-CM | POA: Diagnosis not present

## 2022-02-24 DIAGNOSIS — N2581 Secondary hyperparathyroidism of renal origin: Secondary | ICD-10-CM | POA: Diagnosis not present

## 2022-02-24 DIAGNOSIS — N186 End stage renal disease: Secondary | ICD-10-CM | POA: Diagnosis not present

## 2022-02-24 DIAGNOSIS — Z992 Dependence on renal dialysis: Secondary | ICD-10-CM | POA: Diagnosis not present

## 2022-02-25 DIAGNOSIS — N186 End stage renal disease: Secondary | ICD-10-CM | POA: Diagnosis not present

## 2022-02-25 DIAGNOSIS — Z992 Dependence on renal dialysis: Secondary | ICD-10-CM | POA: Diagnosis not present

## 2022-02-25 DIAGNOSIS — D631 Anemia in chronic kidney disease: Secondary | ICD-10-CM | POA: Diagnosis not present

## 2022-02-25 DIAGNOSIS — L299 Pruritus, unspecified: Secondary | ICD-10-CM | POA: Diagnosis not present

## 2022-02-25 DIAGNOSIS — T782XXA Anaphylactic shock, unspecified, initial encounter: Secondary | ICD-10-CM | POA: Diagnosis not present

## 2022-02-25 DIAGNOSIS — R197 Diarrhea, unspecified: Secondary | ICD-10-CM | POA: Diagnosis not present

## 2022-02-25 DIAGNOSIS — N2581 Secondary hyperparathyroidism of renal origin: Secondary | ICD-10-CM | POA: Diagnosis not present

## 2022-02-25 DIAGNOSIS — R52 Pain, unspecified: Secondary | ICD-10-CM | POA: Diagnosis not present

## 2022-02-27 DIAGNOSIS — N186 End stage renal disease: Secondary | ICD-10-CM | POA: Diagnosis not present

## 2022-02-27 DIAGNOSIS — Z992 Dependence on renal dialysis: Secondary | ICD-10-CM | POA: Diagnosis not present

## 2022-02-27 DIAGNOSIS — D631 Anemia in chronic kidney disease: Secondary | ICD-10-CM | POA: Diagnosis not present

## 2022-02-27 DIAGNOSIS — N2581 Secondary hyperparathyroidism of renal origin: Secondary | ICD-10-CM | POA: Diagnosis not present

## 2022-02-27 DIAGNOSIS — T782XXA Anaphylactic shock, unspecified, initial encounter: Secondary | ICD-10-CM | POA: Diagnosis not present

## 2022-02-27 DIAGNOSIS — R52 Pain, unspecified: Secondary | ICD-10-CM | POA: Diagnosis not present

## 2022-02-27 DIAGNOSIS — R197 Diarrhea, unspecified: Secondary | ICD-10-CM | POA: Diagnosis not present

## 2022-02-27 DIAGNOSIS — L299 Pruritus, unspecified: Secondary | ICD-10-CM | POA: Diagnosis not present

## 2022-03-02 DIAGNOSIS — D631 Anemia in chronic kidney disease: Secondary | ICD-10-CM | POA: Diagnosis not present

## 2022-03-02 DIAGNOSIS — R52 Pain, unspecified: Secondary | ICD-10-CM | POA: Diagnosis not present

## 2022-03-02 DIAGNOSIS — L299 Pruritus, unspecified: Secondary | ICD-10-CM | POA: Diagnosis not present

## 2022-03-02 DIAGNOSIS — Z992 Dependence on renal dialysis: Secondary | ICD-10-CM | POA: Diagnosis not present

## 2022-03-02 DIAGNOSIS — R197 Diarrhea, unspecified: Secondary | ICD-10-CM | POA: Diagnosis not present

## 2022-03-02 DIAGNOSIS — N2581 Secondary hyperparathyroidism of renal origin: Secondary | ICD-10-CM | POA: Diagnosis not present

## 2022-03-02 DIAGNOSIS — T782XXA Anaphylactic shock, unspecified, initial encounter: Secondary | ICD-10-CM | POA: Diagnosis not present

## 2022-03-02 DIAGNOSIS — N186 End stage renal disease: Secondary | ICD-10-CM | POA: Diagnosis not present

## 2022-03-04 DIAGNOSIS — Z992 Dependence on renal dialysis: Secondary | ICD-10-CM | POA: Diagnosis not present

## 2022-03-04 DIAGNOSIS — N186 End stage renal disease: Secondary | ICD-10-CM | POA: Diagnosis not present

## 2022-03-04 DIAGNOSIS — D631 Anemia in chronic kidney disease: Secondary | ICD-10-CM | POA: Diagnosis not present

## 2022-03-04 DIAGNOSIS — R52 Pain, unspecified: Secondary | ICD-10-CM | POA: Diagnosis not present

## 2022-03-04 DIAGNOSIS — L299 Pruritus, unspecified: Secondary | ICD-10-CM | POA: Diagnosis not present

## 2022-03-04 DIAGNOSIS — R197 Diarrhea, unspecified: Secondary | ICD-10-CM | POA: Diagnosis not present

## 2022-03-04 DIAGNOSIS — T782XXA Anaphylactic shock, unspecified, initial encounter: Secondary | ICD-10-CM | POA: Diagnosis not present

## 2022-03-04 DIAGNOSIS — N2581 Secondary hyperparathyroidism of renal origin: Secondary | ICD-10-CM | POA: Diagnosis not present

## 2022-03-05 IMAGING — CR DG RIBS W/ CHEST 3+V*R*
4 series · 4 of 4 positions shown · non-contrast
Comparison: July 07, 2021

CLINICAL DATA: Rib pain.

EXAM:
RIGHT RIBS AND CHEST - 3+ VIEW

[chest pa]
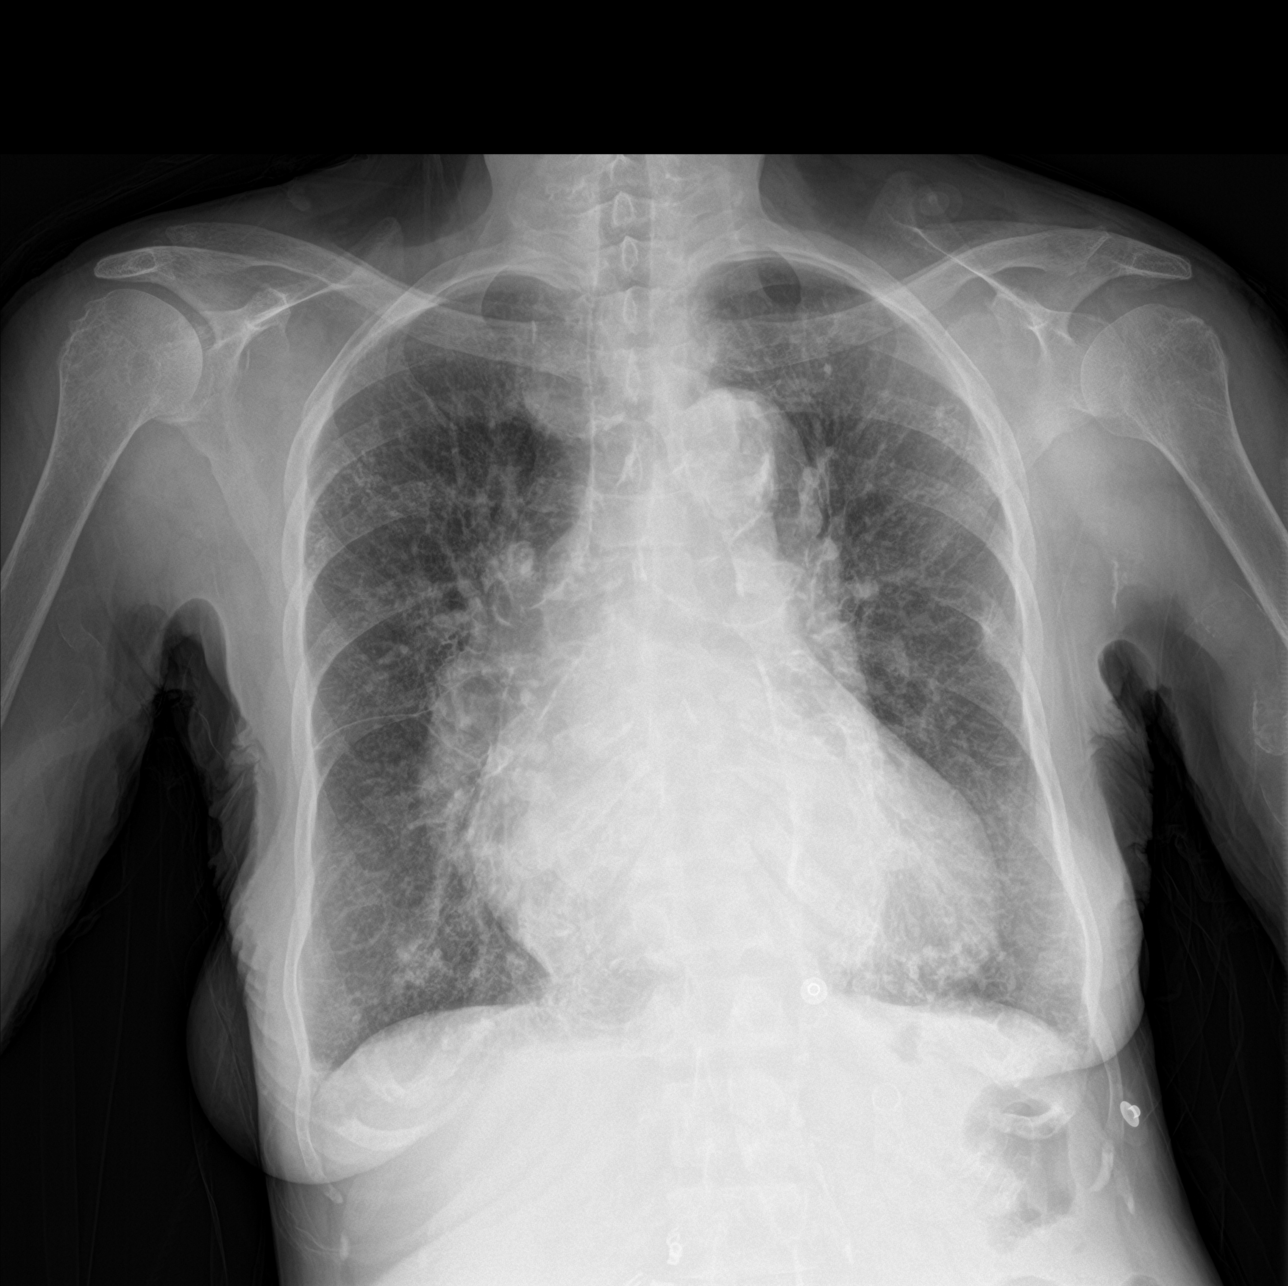

[rib pa]
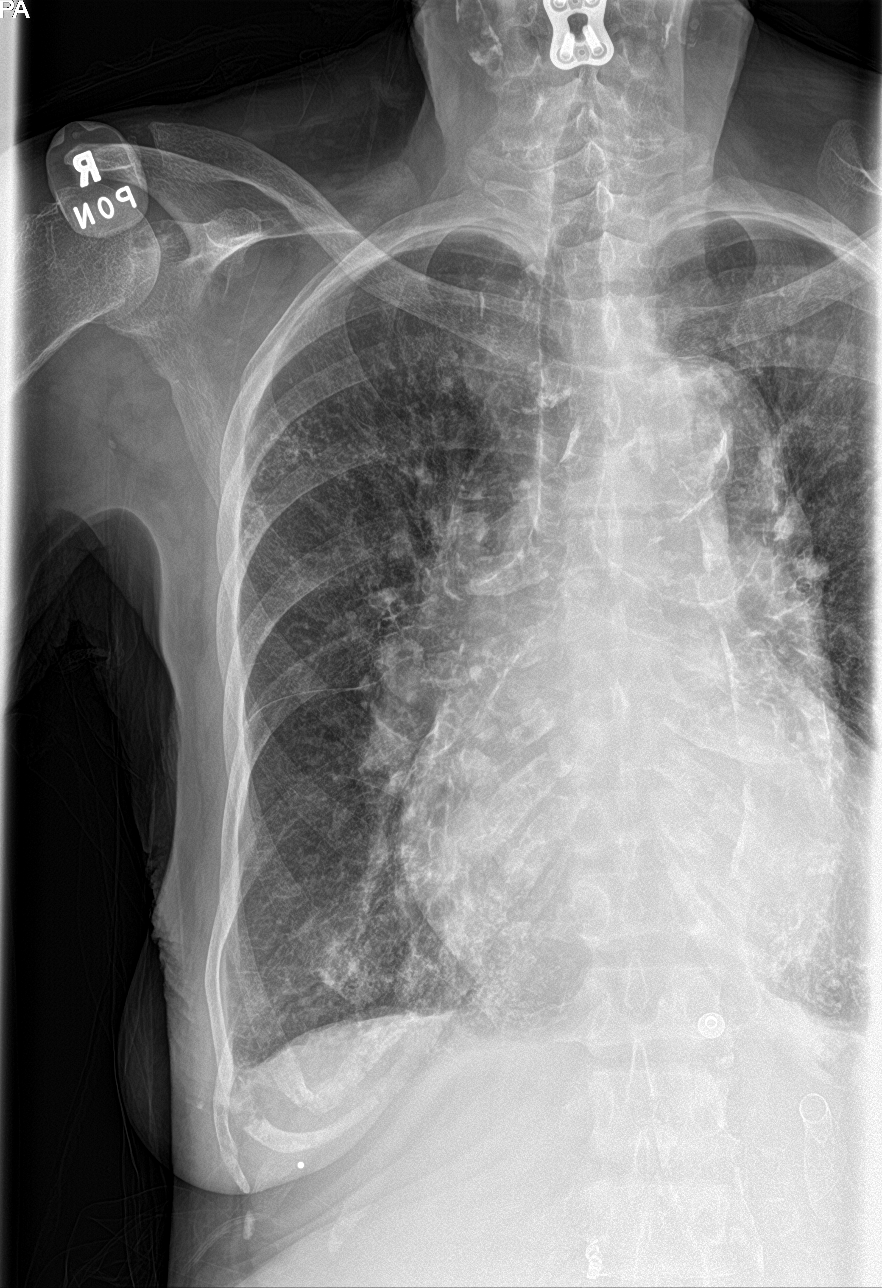

[rib pa obl (1 of 2)]
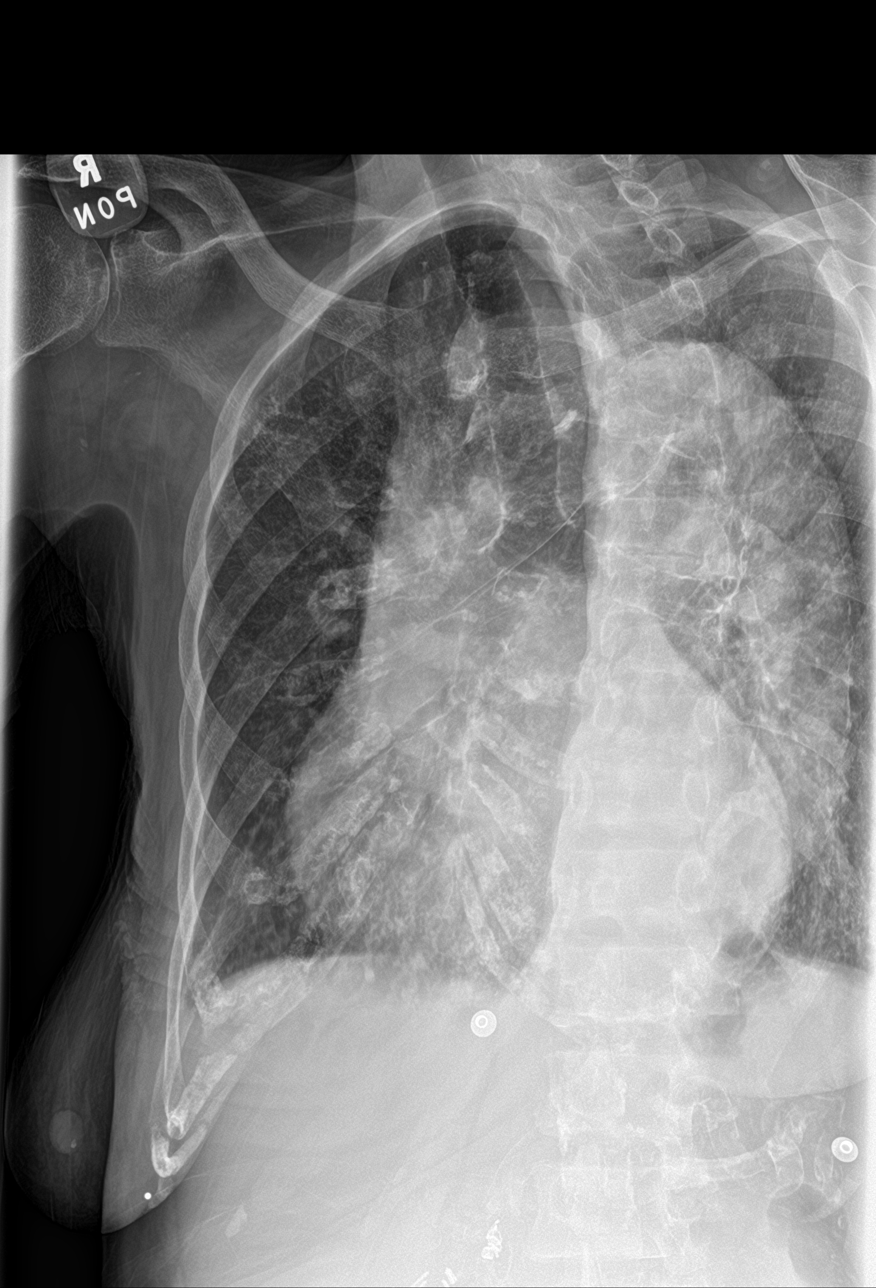

[rib pa obl (2 of 2)]
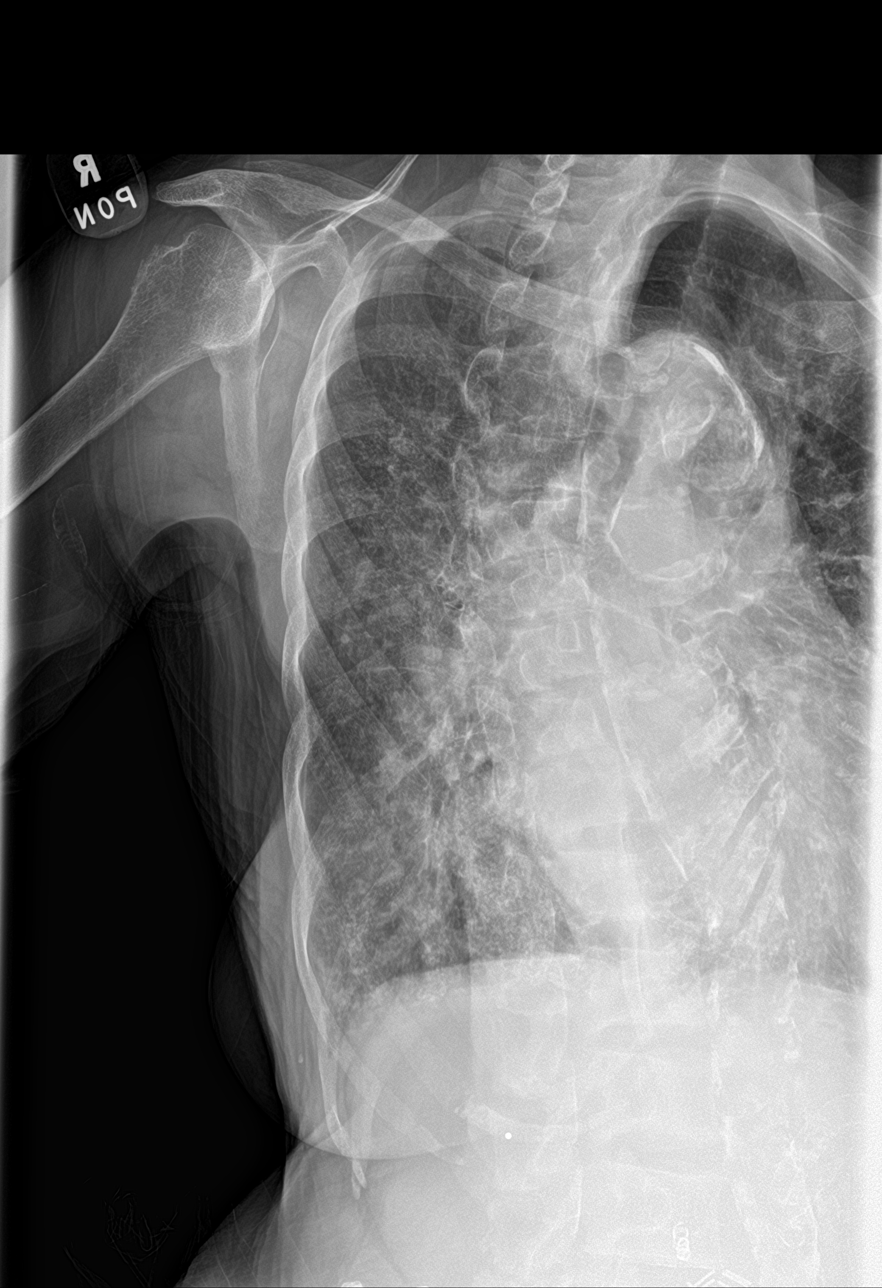

[4 of 4 positions shown; findings below may reference images not displayed]

FINDINGS: No pneumothorax. Stable cardiomegaly. The hila and mediastinum are
unchanged. No acute infiltrate. Kerley B lines and mild prominence
of the interstitium identified.

No rib fractures are noted.
IMPRESSION: 1. No rib fractures or pneumothorax.
2. Cardiomegaly and mild pulmonary edema.
3. No other changes.

## 2022-03-06 DIAGNOSIS — Z992 Dependence on renal dialysis: Secondary | ICD-10-CM | POA: Diagnosis not present

## 2022-03-06 DIAGNOSIS — N2581 Secondary hyperparathyroidism of renal origin: Secondary | ICD-10-CM | POA: Diagnosis not present

## 2022-03-06 DIAGNOSIS — N186 End stage renal disease: Secondary | ICD-10-CM | POA: Diagnosis not present

## 2022-03-06 DIAGNOSIS — D631 Anemia in chronic kidney disease: Secondary | ICD-10-CM | POA: Diagnosis not present

## 2022-03-06 DIAGNOSIS — R197 Diarrhea, unspecified: Secondary | ICD-10-CM | POA: Diagnosis not present

## 2022-03-06 DIAGNOSIS — L299 Pruritus, unspecified: Secondary | ICD-10-CM | POA: Diagnosis not present

## 2022-03-06 DIAGNOSIS — T782XXA Anaphylactic shock, unspecified, initial encounter: Secondary | ICD-10-CM | POA: Diagnosis not present

## 2022-03-06 DIAGNOSIS — R52 Pain, unspecified: Secondary | ICD-10-CM | POA: Diagnosis not present

## 2022-03-09 DIAGNOSIS — N2581 Secondary hyperparathyroidism of renal origin: Secondary | ICD-10-CM | POA: Diagnosis not present

## 2022-03-09 DIAGNOSIS — R197 Diarrhea, unspecified: Secondary | ICD-10-CM | POA: Diagnosis not present

## 2022-03-09 DIAGNOSIS — R52 Pain, unspecified: Secondary | ICD-10-CM | POA: Diagnosis not present

## 2022-03-09 DIAGNOSIS — Z992 Dependence on renal dialysis: Secondary | ICD-10-CM | POA: Diagnosis not present

## 2022-03-09 DIAGNOSIS — D631 Anemia in chronic kidney disease: Secondary | ICD-10-CM | POA: Diagnosis not present

## 2022-03-09 DIAGNOSIS — L299 Pruritus, unspecified: Secondary | ICD-10-CM | POA: Diagnosis not present

## 2022-03-09 DIAGNOSIS — N186 End stage renal disease: Secondary | ICD-10-CM | POA: Diagnosis not present

## 2022-03-09 DIAGNOSIS — T782XXA Anaphylactic shock, unspecified, initial encounter: Secondary | ICD-10-CM | POA: Diagnosis not present

## 2022-03-11 DIAGNOSIS — L299 Pruritus, unspecified: Secondary | ICD-10-CM | POA: Diagnosis not present

## 2022-03-11 DIAGNOSIS — N186 End stage renal disease: Secondary | ICD-10-CM | POA: Diagnosis not present

## 2022-03-11 DIAGNOSIS — D631 Anemia in chronic kidney disease: Secondary | ICD-10-CM | POA: Diagnosis not present

## 2022-03-11 DIAGNOSIS — N2581 Secondary hyperparathyroidism of renal origin: Secondary | ICD-10-CM | POA: Diagnosis not present

## 2022-03-11 DIAGNOSIS — R197 Diarrhea, unspecified: Secondary | ICD-10-CM | POA: Diagnosis not present

## 2022-03-11 DIAGNOSIS — Z992 Dependence on renal dialysis: Secondary | ICD-10-CM | POA: Diagnosis not present

## 2022-03-11 DIAGNOSIS — T782XXA Anaphylactic shock, unspecified, initial encounter: Secondary | ICD-10-CM | POA: Diagnosis not present

## 2022-03-11 DIAGNOSIS — R52 Pain, unspecified: Secondary | ICD-10-CM | POA: Diagnosis not present

## 2022-03-13 ENCOUNTER — Other Ambulatory Visit: Payer: Self-pay | Admitting: *Deleted

## 2022-03-13 DIAGNOSIS — L299 Pruritus, unspecified: Secondary | ICD-10-CM | POA: Diagnosis not present

## 2022-03-13 DIAGNOSIS — Z992 Dependence on renal dialysis: Secondary | ICD-10-CM | POA: Diagnosis not present

## 2022-03-13 DIAGNOSIS — N2581 Secondary hyperparathyroidism of renal origin: Secondary | ICD-10-CM | POA: Diagnosis not present

## 2022-03-13 DIAGNOSIS — R52 Pain, unspecified: Secondary | ICD-10-CM | POA: Diagnosis not present

## 2022-03-13 DIAGNOSIS — L98499 Non-pressure chronic ulcer of skin of other sites with unspecified severity: Secondary | ICD-10-CM

## 2022-03-13 DIAGNOSIS — T782XXA Anaphylactic shock, unspecified, initial encounter: Secondary | ICD-10-CM | POA: Diagnosis not present

## 2022-03-13 DIAGNOSIS — R197 Diarrhea, unspecified: Secondary | ICD-10-CM | POA: Diagnosis not present

## 2022-03-13 DIAGNOSIS — T8612 Kidney transplant failure: Secondary | ICD-10-CM | POA: Diagnosis not present

## 2022-03-13 DIAGNOSIS — N186 End stage renal disease: Secondary | ICD-10-CM | POA: Diagnosis not present

## 2022-03-13 DIAGNOSIS — D631 Anemia in chronic kidney disease: Secondary | ICD-10-CM | POA: Diagnosis not present

## 2022-03-15 IMAGING — DX DG CHEST 1V PORT
1 series · 1 of 1 positions shown · non-contrast
Comparison: 07/27/2021.

CLINICAL DATA: abdominal pain

EXAM:
PORTABLE CHEST 1 VIEW

[chest]
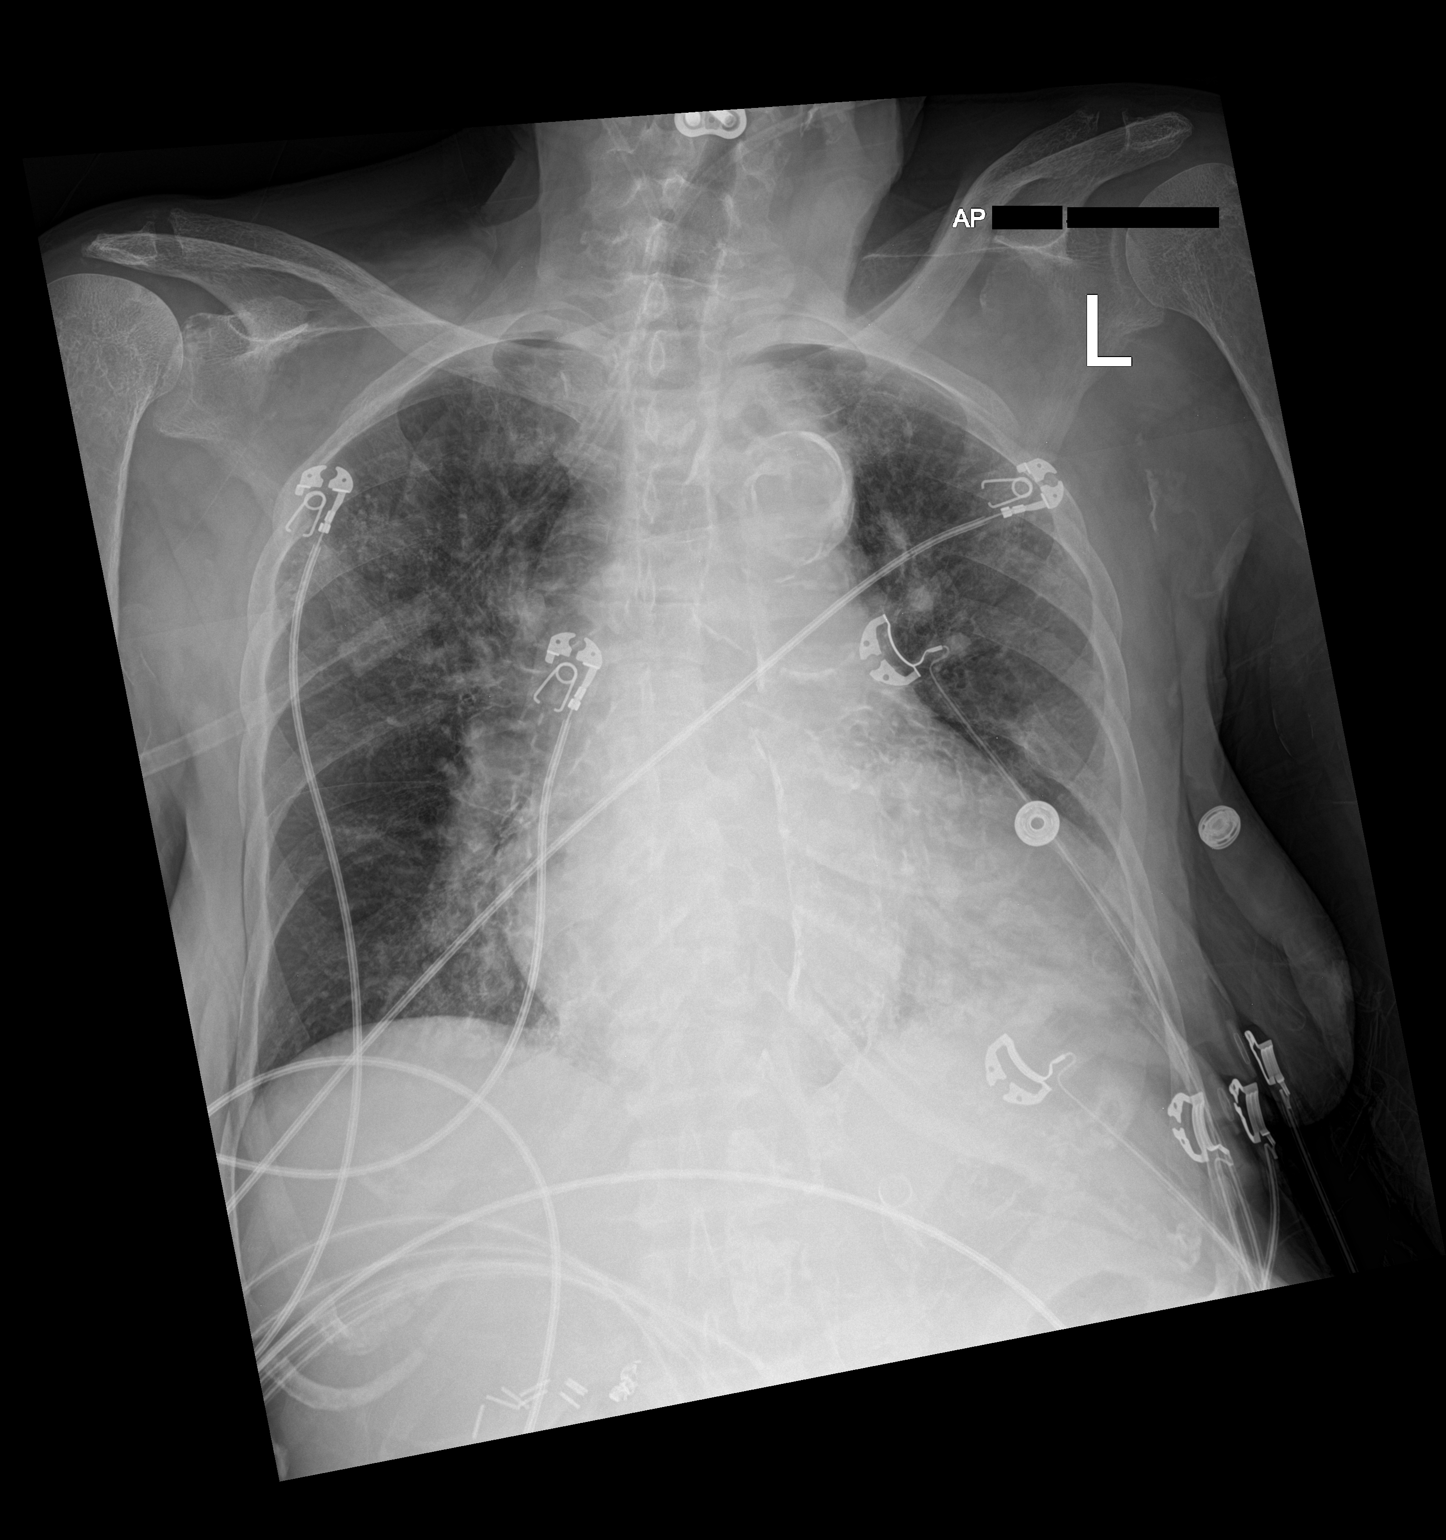

[1 of 1 positions shown; findings below may reference images not displayed]

FINDINGS: Similar enlargement the cardiac silhouette. Pulmonary vascular
congestion. Calcific atherosclerosis of the aorta. Left basilar
opacities. No visible pleural effusions or pneumothorax. Right upper
quadrant clips. Partially imaged cervical ACDF.
IMPRESSION: 1. Cardiomegaly and pulmonary vascular congestion.
2. Left basilar opacities, which could represent atelectasis,
aspiration, and/or pneumonia.

## 2022-03-15 IMAGING — CT CT ABD-PELV W/ CM
2 of 6 series · 14 of 46 positions shown, 16 images · IV contrast (APPLIED)
Comparison: 07/21/2020

CLINICAL DATA: Nausea/vomiting Abdominal pain, acute, nonlocalized

EXAM:
CT ABDOMEN AND PELVIS WITH CONTRAST
TECHNIQUE: Multidetector CT imaging of the abdomen and pelvis was performed
using the standard protocol following bolus administration of
intravenous contrast.
CONTRAST:  100mL OMNIPAQUE IOHEXOL 350 MG/ML SOLN

[Series 3: abd/ pelvis 5.0 i30f 2 · axial · 0.67mm/px · z∈[-682,-372]mm · 11 of 76 slices shown, 13 images]
[im 7/76  soft-tissue]
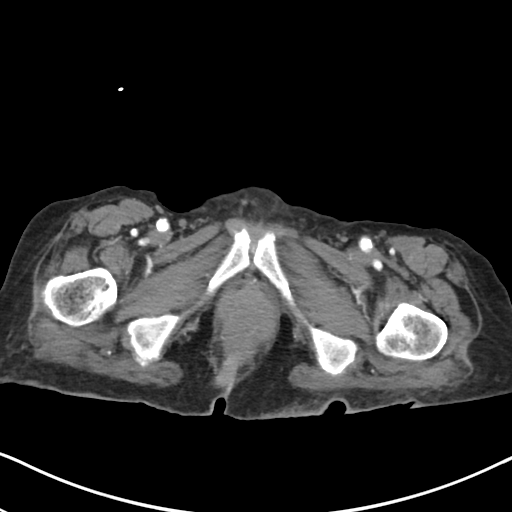
[im 7/76  bone]
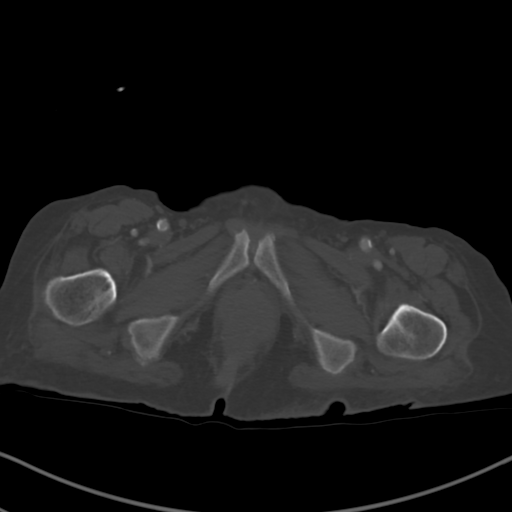
[im 13/76  soft-tissue]
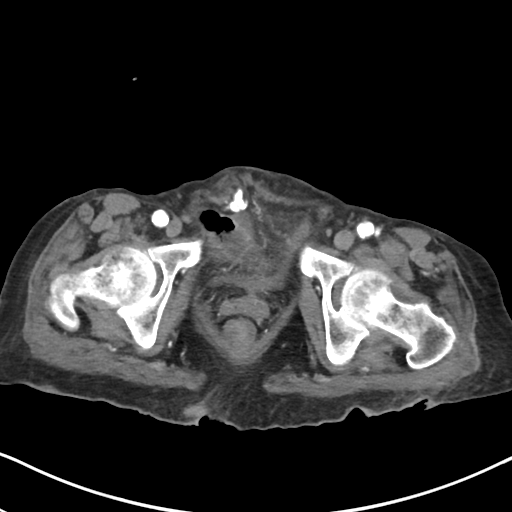
[im 19/76  soft-tissue]
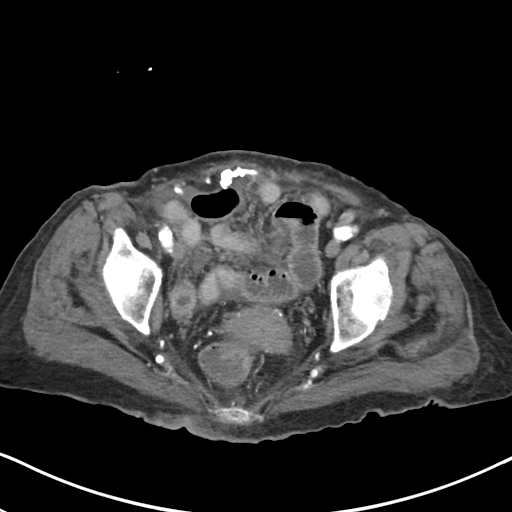
[im 26/76  soft-tissue]
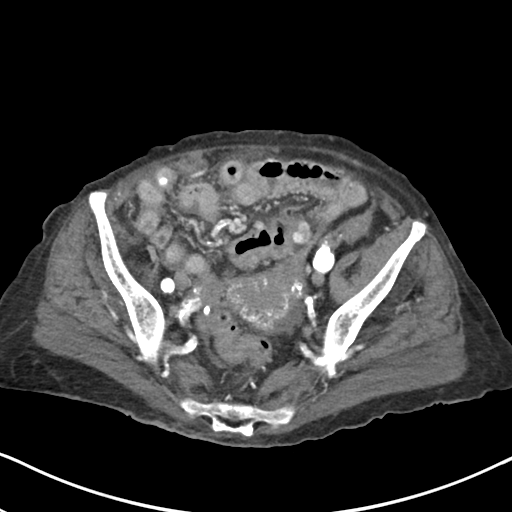
[im 32/76  soft-tissue]
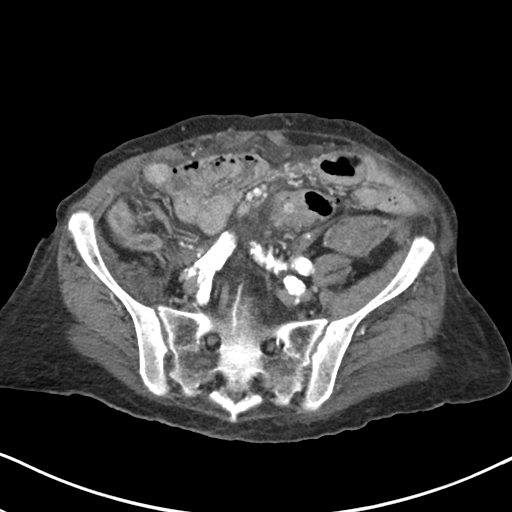
[im 38/76  soft-tissue]
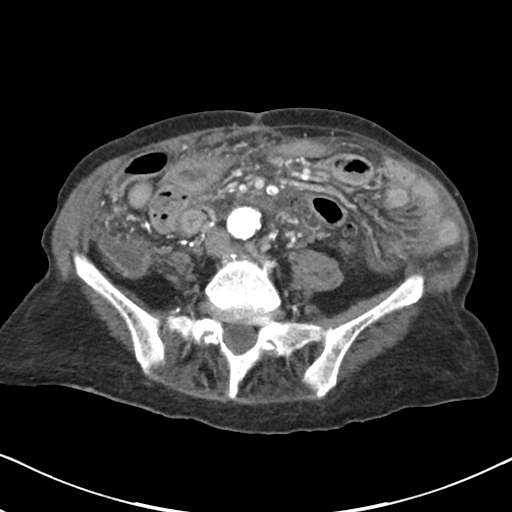
[im 44/76  soft-tissue]
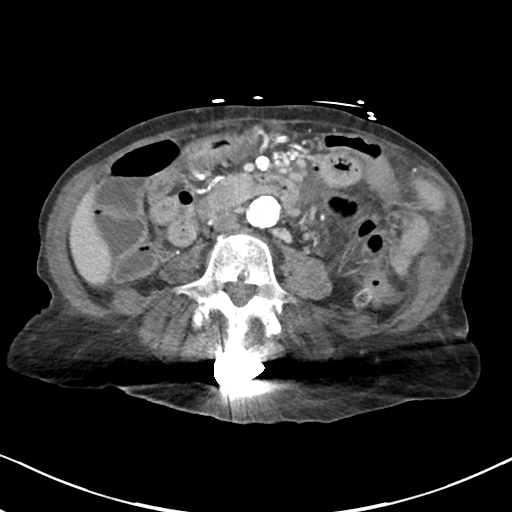
[im 51/76  soft-tissue]
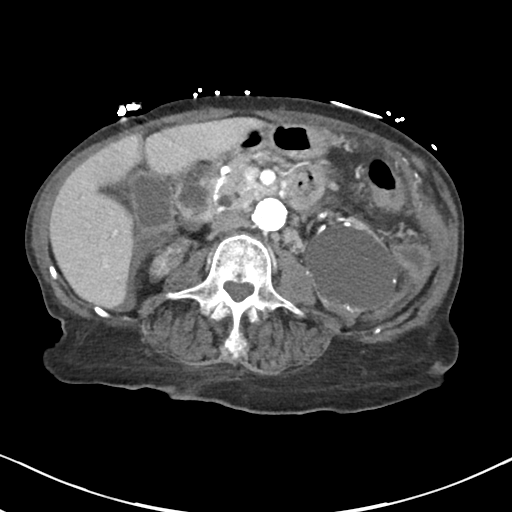
[im 57/76  soft-tissue]
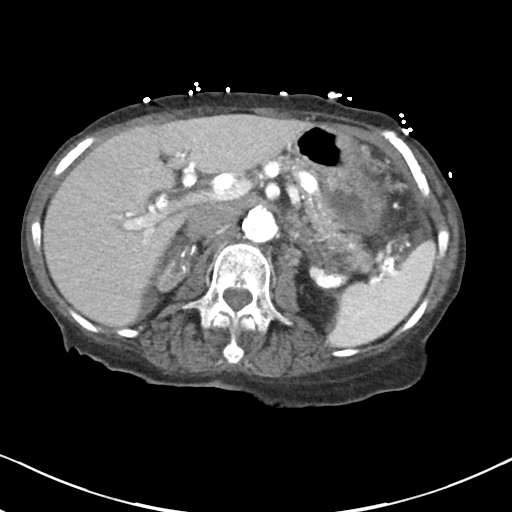
[im 57/76  bone]
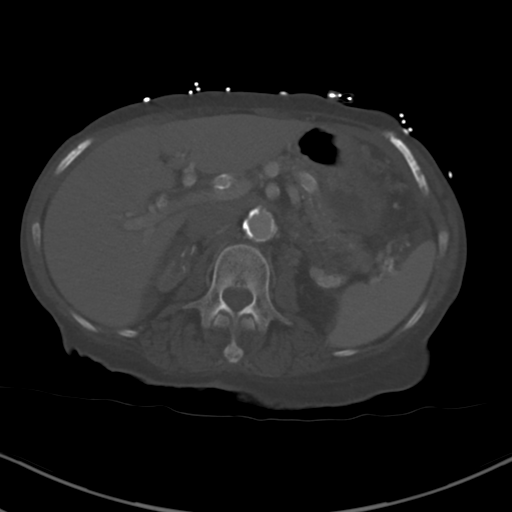
[im 63/76  soft-tissue]
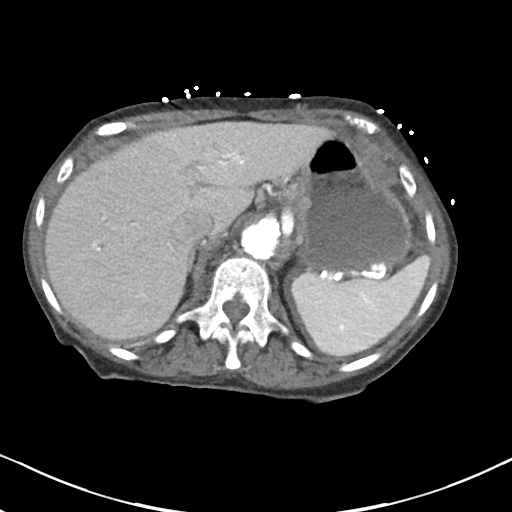
[im 69/76  soft-tissue]
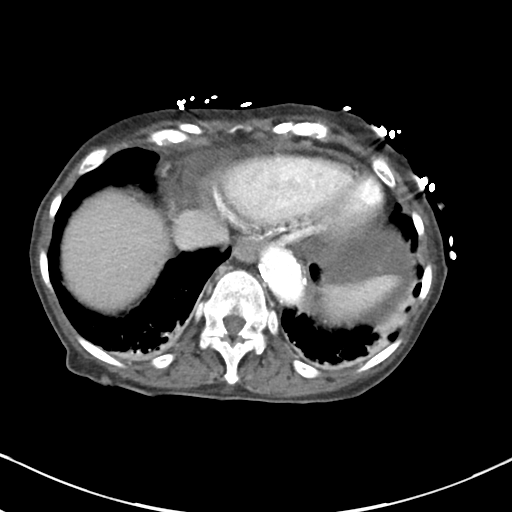

[Series 8: coronal soft tissue · coronal · 0.74mm/px · 3 of 80 slices shown]
[im 27/80  soft-tissue]
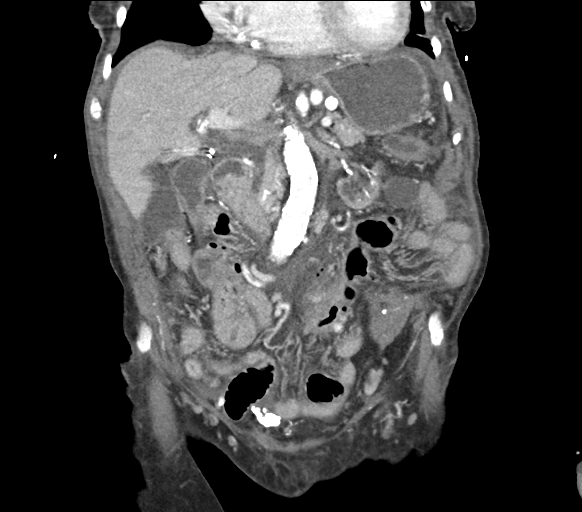
[im 36/80  soft-tissue]
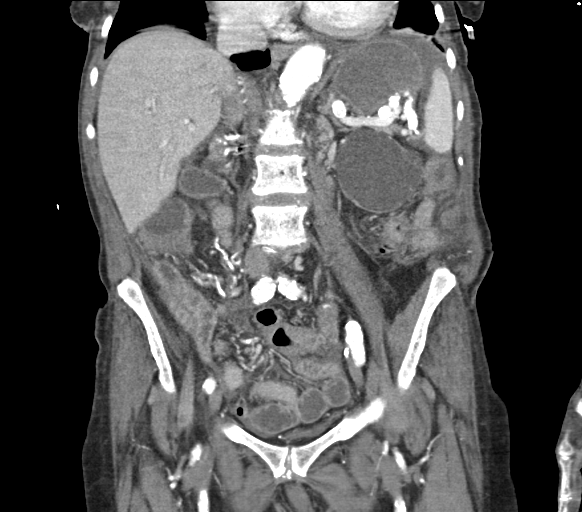
[im 44/80  soft-tissue]
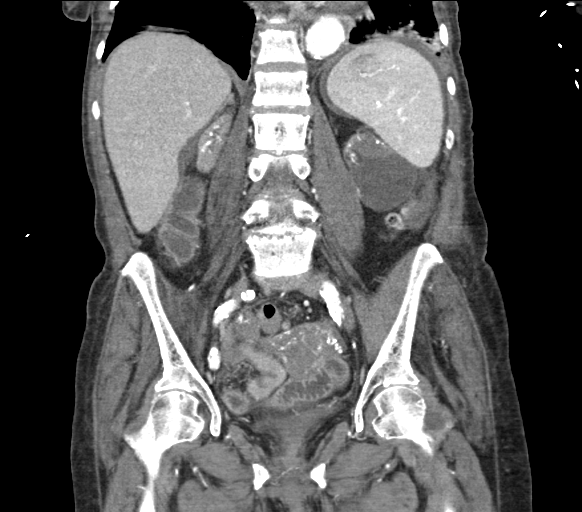

[14 of 46 positions shown; findings below may reference images not displayed]

FINDINGS: Lower chest: Dependent bibasilar atelectasis. Cardiomegaly. Coronary
artery calcification.

Hepatobiliary: No focal liver abnormality is seen. Status post
cholecystectomy. No biliary dilatation.

Pancreas: Unremarkable. No pancreatic ductal dilatation or
surrounding inflammatory changes.

Spleen: Normal in size without focal abnormality.

Adrenals/Urinary Tract: Unremarkable adrenal glands. Advanced
bilateral renal atrophy. Left renal cyst has slightly increased in
size from prior now measuring 6.4 cm (previously 5.6 cm). No
hydronephrosis. Urinary bladder is decompressed, limiting its
evaluation.

Stomach/Bowel: Stomach is unremarkable. No dilated loops of bowel.
There is a focally thickened short segment of sigmoid colon which
contains multiple diverticula. Adjacent pericolonic fat stranding,
free fluid, and numerous foci of extraluminal air (series 3, images
38-47). No organized fluid collection or abscess. There is mild long
segment thickening throughout the remaining colon. There are
multiple thickened appearing loops of small bowel within the
abdomen, likely a reactive enteritis.

Vascular/Lymphatic: Advanced atherosclerotic calcifications
throughout the aortoiliac axis. Thrombosed peripherally calcified
proximal left renal artery aneurysm measuring 1.8 cm in diameter
(series 3, image 22), unchanged. No lymphadenopathy.

Reproductive: Uterus and bilateral adnexa are unremarkable.

Other: Small volume ascites. Chronic soft tissue density in the
anterolateral pelvis with scattered internal calcifications
measuring approximately 4.9 x 2.4 cm, unchanged, and most compatible
with a chronic hematoma.

Musculoskeletal: Chronic changes of renal osteodystrophy. Chronic
erosive changes centered at the L4-5 disc, without evidence of
progression from prior. Unchanged grade 2 anterolisthesis of L4 on
L5. No new or acute bony findings.
IMPRESSION: 1. Acute sigmoid diverticulitis with a few adjacent locules of
extraluminal air compatible with microperforation. No organized
fluid collection or abscess. Surgical consultation recommended.
2. Multiple thickened appearing loops of large and small bowel
within the abdomen, likely a reactive enterocolitis.
3. Small volume ascites.
4. Thrombosed peripherally calcified proximal left renal artery
aneurysm measuring 1.8 cm in diameter, unchanged.
5. Chronic changes of renal osteodystrophy. Chronic erosive changes
centered at the L4-5 disc, without evidence of progression from
prior study.

Aortic Atherosclerosis ([id number].O).

These results were called by telephone at the time of interpretation
on 08/06/2021 at [DATE] to provider Surure, Abie Jone, who verbally
acknowledged these results.

## 2022-03-16 DIAGNOSIS — D631 Anemia in chronic kidney disease: Secondary | ICD-10-CM | POA: Diagnosis not present

## 2022-03-16 DIAGNOSIS — Z992 Dependence on renal dialysis: Secondary | ICD-10-CM | POA: Diagnosis not present

## 2022-03-16 DIAGNOSIS — N186 End stage renal disease: Secondary | ICD-10-CM | POA: Diagnosis not present

## 2022-03-16 DIAGNOSIS — R197 Diarrhea, unspecified: Secondary | ICD-10-CM | POA: Diagnosis not present

## 2022-03-16 DIAGNOSIS — N2581 Secondary hyperparathyroidism of renal origin: Secondary | ICD-10-CM | POA: Diagnosis not present

## 2022-03-16 DIAGNOSIS — L299 Pruritus, unspecified: Secondary | ICD-10-CM | POA: Diagnosis not present

## 2022-03-16 DIAGNOSIS — R52 Pain, unspecified: Secondary | ICD-10-CM | POA: Diagnosis not present

## 2022-03-17 ENCOUNTER — Encounter: Payer: Self-pay | Admitting: Vascular Surgery

## 2022-03-17 ENCOUNTER — Ambulatory Visit (INDEPENDENT_AMBULATORY_CARE_PROVIDER_SITE_OTHER): Payer: Medicare Other | Admitting: Vascular Surgery

## 2022-03-17 ENCOUNTER — Ambulatory Visit (HOSPITAL_COMMUNITY)
Admission: RE | Admit: 2022-03-17 | Discharge: 2022-03-17 | Disposition: A | Discharge status: Home / Self Care | Payer: Medicare Other | Source: Ambulatory Visit | Attending: Family Medicine | Admitting: Family Medicine

## 2022-03-17 DIAGNOSIS — I70245 Atherosclerosis of native arteries of left leg with ulceration of other part of foot: Secondary | ICD-10-CM | POA: Insufficient documentation

## 2022-03-17 DIAGNOSIS — L98499 Non-pressure chronic ulcer of skin of other sites with unspecified severity: Secondary | ICD-10-CM | POA: Diagnosis not present

## 2022-03-17 HISTORY — DX: Atherosclerosis of native arteries of left leg with ulceration of other part of foot: I70.245

## 2022-03-17 NOTE — Progress Notes (Signed)
? ? ?Patient name: [name] MRN: [id number] DOB: October 27, 1951 Sex: female ? ?REASON FOR CONSULT: Non-healing wound left foot ? ?HPI: ?[name] is a 71 y.o. female, with history of end-stage renal disease on hemodialysis Monday Wednesday Friday, hypertension, hyperlipidemia, CVA that presents for evaluation of nonhealing wound to the left foot.  She is here with family and states this wound has been present since September 2022.  They have been applying Vaseline to the wound with minimal improvement.  She is ambulatory with a walker.  States she lives with her mother.  States she does not smoke.  Not a diabetic.  No previous lower extremity interventions. ? ?Past Medical History:  ?Diagnosis Date  ? Adenomatous polyp of colon 10/2010, 2006, 2015  ? Anemia in CKD (chronic kidney disease) 11/07/2012  ? s/p blood transfusion.   ? Arthritis   ? CAD (coronary artery disease)   ? "something like that"  ? CHF (congestive heart failure) ([place])   ? Constipation   ? Depression with anxiety   ? Diverticula, colon   ? ESRD (end stage renal disease) ([place]) 11/07/2012  ? 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  Had some early rejection then stable function for years, then had slow decline of function and went back on hemodialysis in 2012.  Gets HD TTS schedule at [facility] on [facility] still using L forearm AVF.     ? GERD (gastroesophageal reflux disease)   ? GI bleed 2017  ? felt to be ischemic colitis, last colo 2015  ? Headache   ? Hyperlipidemia   ? Hypertension   ? Neurologic gait dysfunction   ? Neuromuscular disorder ([place])   ? neuropathy hand and legs  ? Osteoporosis   ? Pneumonia   ? Pseudoaneurysm of surgical AV fistula ([place])   ? left upper arm  ? Pulmonary edema 12/2019  ? Stroke [facility]) 11/2015  ? TIA  ? Weight loss, unintentional   ? ? ?Past Surgical History:  ?Procedure Laterality Date  ? AV FISTULA PLACEMENT    ? for dialysis  ? AV FISTULA PLACEMENT Left 11/22/2015   ? Procedure: ARTERIOVENOUS (AV) FISTULA CREATION-LEFT BRACHIOCEPHALIC;  Surgeon: Serafina Mitchell, MD;  Location: [place];  Service: Vascular;  Laterality: Left;  ? AV FISTULA PLACEMENT Right 03/15/2020  ? Procedure: INSERTION OF ARTERIOVENOUS (AV) GORE-TEX GRAFT ARM ( BRACHIAL AXILLARY );  Surgeon: Katha Cabal, MD;  Location: [facility];  Service: Vascular;  Laterality: Right;  ? BACK SURGERY    ? CERVICAL FUSION    ? CHOLECYSTECTOMY  12/02/2012  ? Procedure: LAPAROSCOPIC CHOLECYSTECTOMY WITH INTRAOPERATIVE CHOLANGIOGRAM;  Surgeon: Edward Jolly, MD;  [facility];  Service: General;  Laterality: N/A;  ? EYE SURGERY Bilateral   ? cataract surgery  ? EYE SURGERY Left 2019  ? laser  ? HEMATOMA EVACUATION Left 12/24/2016  ? Procedure: EVACUATION HEMATOMA LEFT UPPER ARM;  Surgeon: Waynetta Sandy, MD;  Location: [place];  Service: Vascular;  Laterality: Left;  ? I & D EXTREMITY Left 12/31/2016  ? Procedure: IRRIGATION AND DEBRIDEMENT EXTREMITY;  Surgeon: Angelia Mould, MD;  Location: [place];  Service: Vascular;  Laterality: Left;  ? INSERTION OF DIALYSIS CATHETER Right 12/24/2016  ? Procedure: INSERTION OF DIALYSIS CATHETER;  Surgeon: Waynetta Sandy, MD;  Location: [place];  Service: Vascular;  Laterality: Right;  ? INSERTION OF DIALYSIS CATHETER Right 02/04/2017  ? Procedure: INSERTION OF DIALYSIS CATHETER;  Surgeon: Waynetta Sandy, MD;  Location: [place];  Service: Vascular;  Laterality: Right;  ? KIDNEY TRANSPLANT  1996  ? PERIPHERAL VASCULAR CATHETERIZATION Left 10/23/2016  ? Procedure: Fistulagram;  Surgeon: Elam Dutch, MD;  Location: [place] CV LAB;  Service: Cardiovascular;  Laterality: Left;  ? PERIPHERAL VASCULAR THROMBECTOMY Right 04/16/2020  ? Procedure: PERIPHERAL VASCULAR THROMBECTOMY;  Surgeon: Katha Cabal, MD;  Location: [place] CV LAB;  Service: Cardiovascular;  Laterality: Right;  ? RESECTION OF ARTERIOVENOUS FISTULA ANEURYSM Left 11/22/2015  ?  Procedure: RESECTION OF LEFT RADIOCEPHALIC FISTULA ANEURYSM ;  Surgeon: Serafina Mitchell, MD;  Location: [place];  Service: Vascular;  Laterality: Left;  ? REVISON OF ARTERIOVENOUS FISTULA Left 12/22/2016  ? Procedure: REVISON OF LEFT ARTERIOVENOUS FISTULA;  Surgeon: Waynetta Sandy, MD;  Location: [place];  Service: Vascular;  Laterality: Left;  ? REVISON OF ARTERIOVENOUS FISTULA Left 02/04/2017  ? Procedure: REVISON OF LEFT UPPER ARM ARTERIOVENOUS FISTULA;  Surgeon: Waynetta Sandy, MD;  Location: [place];  Service: Vascular;  Laterality: Left;  ? UPPER EXTREMITY ANGIOGRAPHY Bilateral 09/19/2019  ? Procedure: UPPER EXTREMITY ANGIOGRAPHY;  Surgeon: Katha Cabal, MD;  Location: [place] CV LAB;  Service: Cardiovascular;  Laterality: Bilateral;  ? ? ?Family History  ?Problem Relation Age of Onset  ? Colon cancer Brother   ? Cancer Brother   ? Coronary artery disease Mother 74  ? Hyperlipidemia Mother   ? Hypertension Mother   ? Stroke Maternal Aunt   ? Esophageal cancer Neg Hx   ? Stomach cancer Neg Hx   ? Rectal cancer Neg Hx   ? ? ?SOCIAL HISTORY: ?Social History  ? ?Socioeconomic History  ? Marital status: Widowed  ?  Spouse name: Not on file  ? Number of children: 2  ? Years of education: Not on file  ? Highest education level: Not on file  ?Occupational History  ? Not on file  ?Tobacco Use  ? Smoking status: Former  ?  Types: Cigarettes  ?  Quit date: 12/31/1991  ?  Years since quitting: 30.2  ? Smokeless tobacco: Never  ?Vaping Use  ? Vaping Use: Never used  ?Substance and Sexual Activity  ? Alcohol use: No  ?  Alcohol/week: 0.0 standard drinks  ? Drug use: No  ? Sexual activity: Not Currently  ?Other Topics Concern  ? Not on file  ?Social History Narrative  ? Living with her mother   ? Right handed  ? Caffeine: only decaf clears ("no brown sodas" or coffee d/t kidney disease)  ? Low potassium foods d/t kidney disease  ? ?Social Determinants of Health  ? ?Financial Resource Strain: Not on file   ?Food Insecurity: Not on file  ?Transportation Needs: Not on file  ?Physical Activity: Not on file  ?Stress: Not on file  ?Social Connections: Not on file  ?Intimate Partner Violence: Not on file  ? ? ?Allergies  ?Allergen Reactions  ? Sulfa Antibiotics Other (See Comments)  ?  Per patient, both parents allergic-so will not take  ? Adhesive [Tape] Itching  ? ? ?Current Outpatient Medications  ?Medication Sig Dispense Refill  ? acetaminophen (TYLENOL) 500 MG tablet Take 1 tablet (500 mg total) by mouth every 6 (six) hours as needed. (Patient taking differently: Take 500 mg by mouth every 6 (six) hours as needed for moderate pain or headache.) 30 tablet 0  ? acidophilus (RISAQUAD) CAPS capsule Take 2 capsules by mouth 3 (three) times daily. 180 capsule  0  ? albuterol (VENTOLIN HFA) 108 (90 Base) MCG/ACT inhaler Inhale 1-2 puffs into the lungs every 6 (six) hours as needed for wheezing or shortness of breath. 1 each 0  ? allopurinol (ZYLOPRIM) 300 MG tablet Take 150 mg by mouth daily.    ? ALPRAZolam (XANAX) 0.25 MG tablet Take 1 tablet (0.25 mg total) by mouth daily as needed for anxiety. 10 tablet 0  ? aspirin EC 81 MG tablet Take 81 mg by mouth at bedtime.    ? atorvastatin (LIPITOR) 40 MG tablet Take 1 tablet (40 mg total) by mouth daily. 90 tablet 3  ? B Complex-C-Folic Acid (DIALYVITE 295) 0.8 MG WAFR Take 1 tablet by mouth daily.    ? cinacalcet (SENSIPAR) 30 MG tablet Take 30 mg by mouth 3 (three) times a week. Mon, Wed, Fri-- Given @ Dialysis    ? feeding supplement (ENSURE ENLIVE / ENSURE PLUS) LIQD Take 237 mLs by mouth 2 (two) times daily between meals. 621 mL 12  ? folic acid-vitamin b complex-vitamin c-selenium-zinc (DIALYVITE) 3 MG TABS tablet Take 1 tablet by mouth daily.    ? metoprolol tartrate (LOPRESSOR) 25 MG tablet Take 0.5 tablets (12.5 mg total) by mouth 2 (two) times daily. 60 tablet 0  ? ondansetron (ZOFRAN) 8 MG tablet Take 8 mg by mouth 2 (two) times daily as needed for nausea or vomiting.    0  ? pantoprazole (PROTONIX) 40 MG tablet Take 1 tablet (40 mg total) by mouth daily. 60 tablet   ? polyethylene glycol (MIRALAX / GLYCOLAX) 17 g packet Take 17 g by mouth 2 (two) times daily. 14 each

## 2022-03-18 ENCOUNTER — Other Ambulatory Visit: Payer: Self-pay

## 2022-03-18 DIAGNOSIS — R52 Pain, unspecified: Secondary | ICD-10-CM | POA: Diagnosis not present

## 2022-03-18 DIAGNOSIS — Z992 Dependence on renal dialysis: Secondary | ICD-10-CM | POA: Diagnosis not present

## 2022-03-18 DIAGNOSIS — D631 Anemia in chronic kidney disease: Secondary | ICD-10-CM | POA: Diagnosis not present

## 2022-03-18 DIAGNOSIS — N2581 Secondary hyperparathyroidism of renal origin: Secondary | ICD-10-CM | POA: Diagnosis not present

## 2022-03-18 DIAGNOSIS — N186 End stage renal disease: Secondary | ICD-10-CM | POA: Diagnosis not present

## 2022-03-18 DIAGNOSIS — L299 Pruritus, unspecified: Secondary | ICD-10-CM | POA: Diagnosis not present

## 2022-03-18 DIAGNOSIS — R197 Diarrhea, unspecified: Secondary | ICD-10-CM | POA: Diagnosis not present

## 2022-03-19 IMAGING — CT CT ABD-PELV W/ CM
2 of 4 series · 15 of 46 positions shown, 17 images · IV contrast (Omni 300)
Comparison: CT abdomen dated 08/06/2021.

CLINICAL DATA: Complication of diverticulitis suspected.

EXAM:
CT ABDOMEN AND PELVIS WITH CONTRAST
TECHNIQUE: Multidetector CT imaging of the abdomen and pelvis was performed
using the standard protocol following bolus administration of
intravenous contrast.
CONTRAST:  75mL OMNIPAQUE IOHEXOL 350 MG/ML SOLN

[Series 3: a/p w/ 5mm · axial · 0.70mm/px · z∈[+13,+343]mm · 12 of 77 slices shown, 14 images]
[im 7/77  soft-tissue]
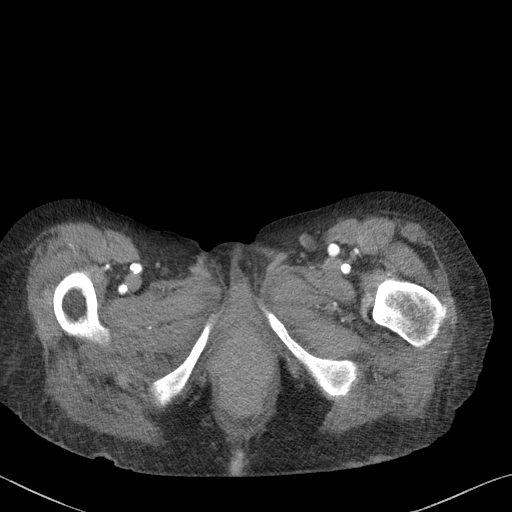
[im 7/77  bone]
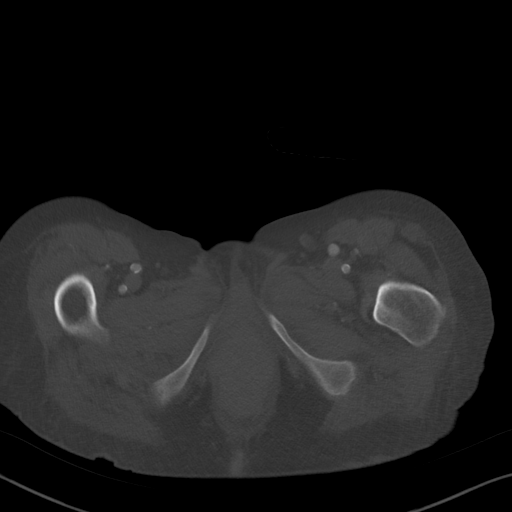
[im 13/77  soft-tissue]
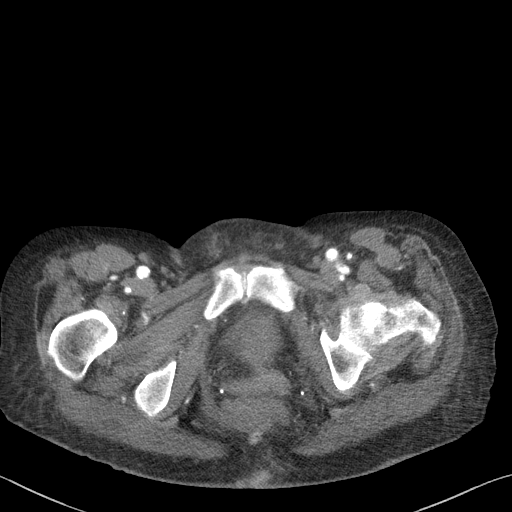
[im 19/77  soft-tissue]
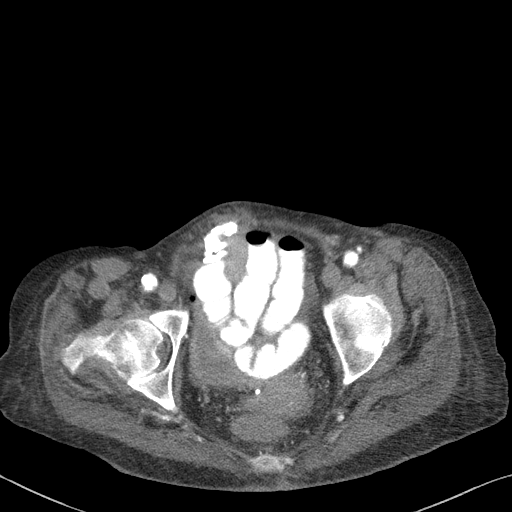
[im 25/77  soft-tissue]
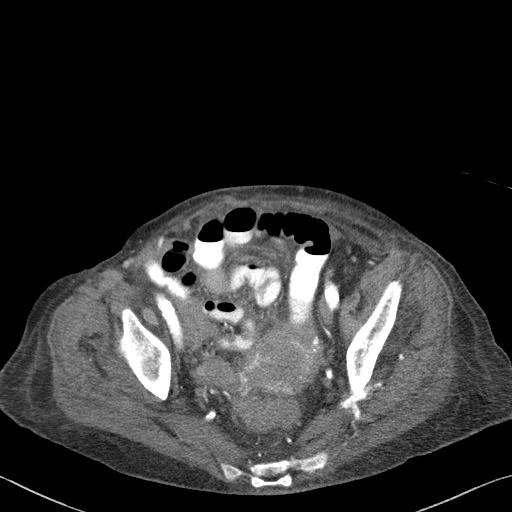
[im 31/77  soft-tissue]
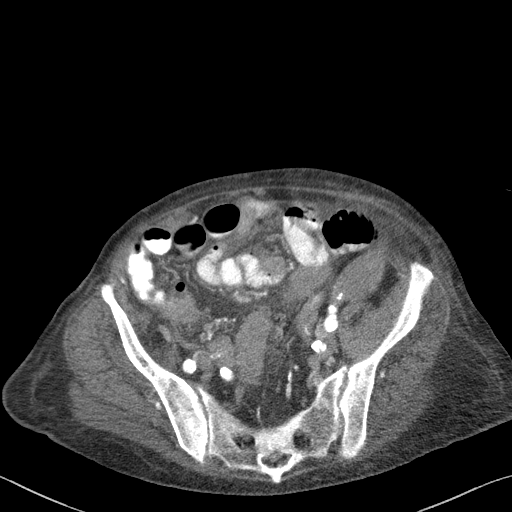
[im 37/77  soft-tissue]
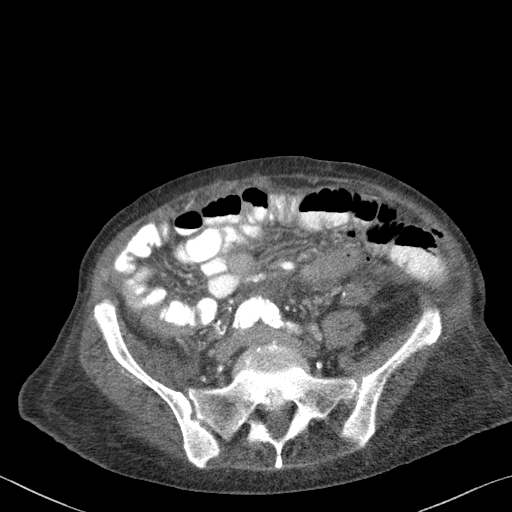
[im 43/77  soft-tissue]
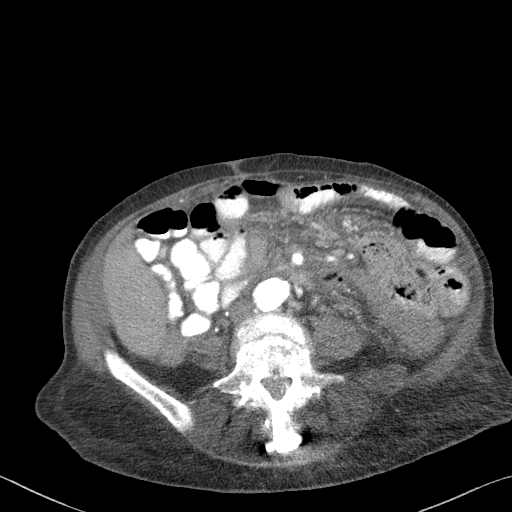
[im 49/77  soft-tissue]
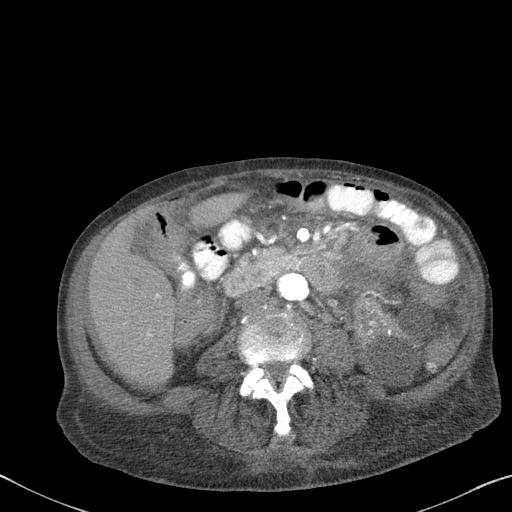
[im 55/77  soft-tissue]
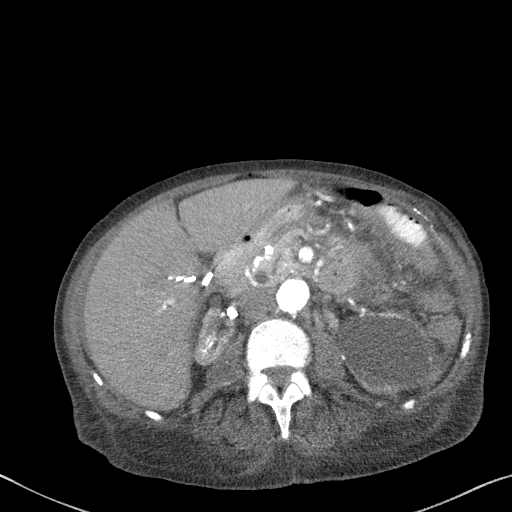
[im 55/77  bone]
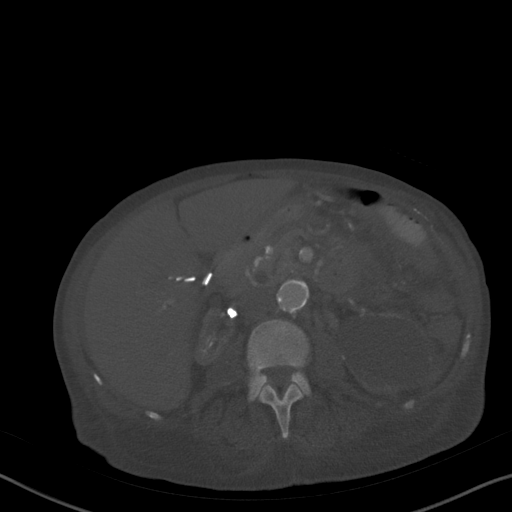
[im 61/77  soft-tissue]
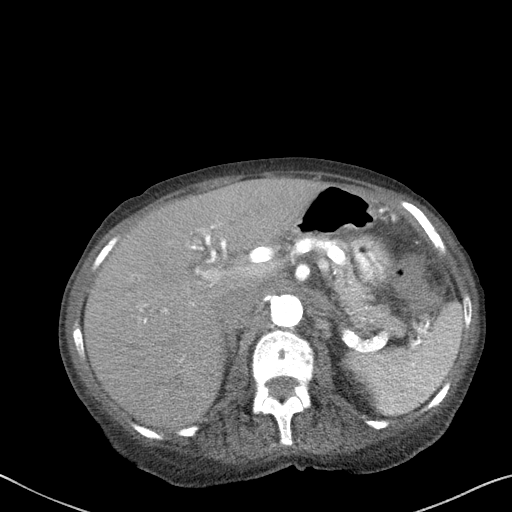
[im 67/77  soft-tissue]
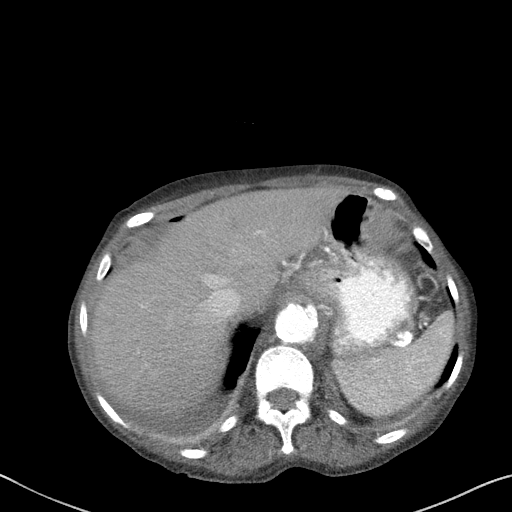
[im 73/77  soft-tissue]
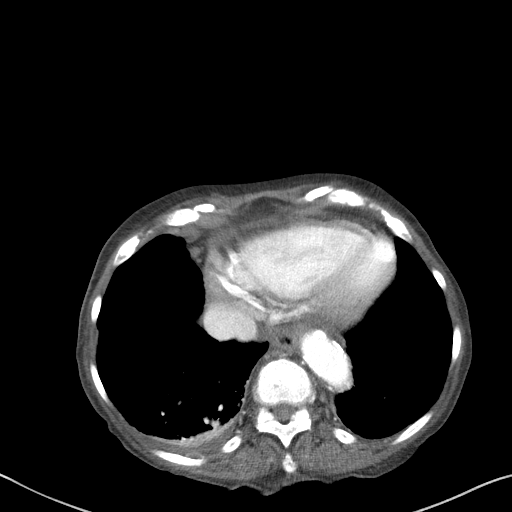

[Series 6: a/p w/ cor · coronal · 0.73mm/px · 3 of 121 slices shown]
[im 41/121  soft-tissue]
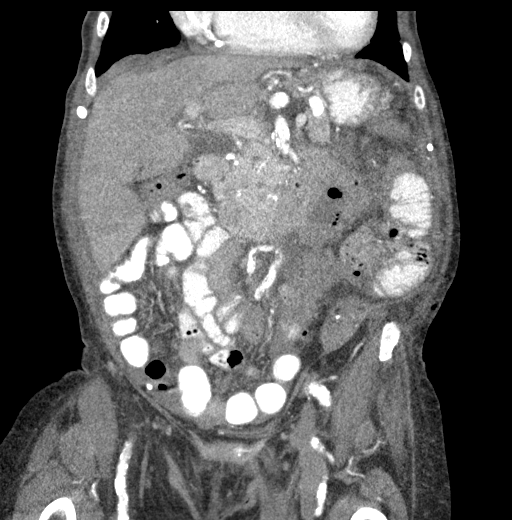
[im 54/121  soft-tissue]
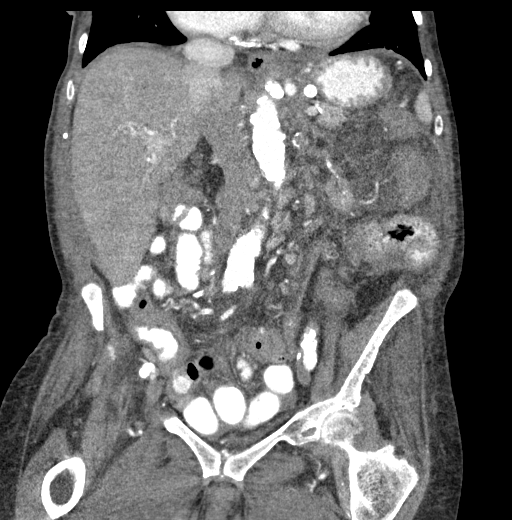
[im 67/121  soft-tissue]
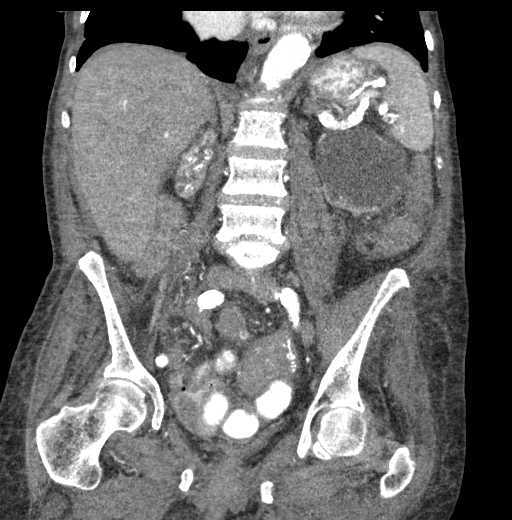

[15 of 46 positions shown; findings below may reference images not displayed]

FINDINGS: Lower chest: Patchy chronic micronodularity at the bilateral lung
bases, incompletely imaged, not significantly changed for greater
than 5 years indicating benignity. Small RIGHT pleural effusion with
adjacent atelectasis.

Hepatobiliary: No focal liver abnormality is seen. Status post
cholecystectomy.

Pancreas: Unremarkable. No pancreatic ductal dilatation or
surrounding inflammatory changes.

Spleen: Normal in size without focal abnormality.

Adrenals/Urinary Tract: Atrophic kidneys. LEFT renal cyst. No acute
findings. Bladder is decompressed.

Stomach/Bowel: Abnormal-appearing small bowel within the LEFT
abdomen (proximal jejunum), with amorphous/thickened walls and
surrounding mesenteric inflammation. There is new free
intraperitoneal air suggesting bowel perforation, likely source
being this abnormal-appearing small bowel in the LEFT abdomen,
suspect bowel ischemia.

Previous study showed abnormal appearing sigmoid/descending colon
suggesting acute diverticulitis. This is not as clearly delineated
on today's exam. The free air may, however, be the result of a
ruptured diverticulitis and the abnormal-appearing small bowel in
the LEFT abdomen may be reactive in nature.

No extravasated oral contrast to confirm small-bowel perforation.

No dilated large or small bowel loops are identified elsewhere in
the abdomen and pelvis

Vascular/Lymphatic: Aortic atherosclerosis. Heavy atherosclerotic
changes of the aortic branch vessels. No acute-appearing vascular
abnormality. LEFT renal artery aneurysm is again seen, measuring
cm diameter.

No enlarged lymph nodes are seen.

Reproductive: Uterus and bilateral adnexa are unremarkable.

Other: Small amount of free fluid within the abdomen and pelvis. No
circumscribed fluid collection, however, early abscess collection in
the LEFT abdomen is difficult to exclude.

Musculoskeletal: No acute-appearing osseous abnormality. Previously
described chronic changes of renal osteodystrophy again noted.
IMPRESSION: 1. New free intraperitoneal air indicating bowel perforation.
2. Abnormal-appearing small bowel within the LEFT abdomen (proximal
jejunum), with amorphous/thickened walls and surrounding mesenteric
inflammation. Findings are suspicious for small bowel perforation at
this level, possibly indicating small bowel ischemia. Alternatively,
this may represent reactive bowel wall thickening (see below).
3. Previous study showed abnormal appearing sigmoid/descending colon
suggesting acute diverticulitis. This is not as clearly delineated
on today's exam. The free air, however, may be the result of a
ruptured diverticulitis with the abnormal-appearing small bowel in
the LEFT abdomen being reactive in nature.
4. Scattered free fluid within the abdomen and pelvis. No
circumscribed fluid collection, however, early abscess collection in
the LEFT abdomen is difficult to exclude.
5. Small RIGHT pleural effusion with adjacent atelectasis.
6. LEFT renal artery aneurysm, measuring 1.6 cm diameter, as
previously described.

Aortic Atherosclerosis ([id number]).

Critical Value/emergent results were called by telephone at the time
of interpretation on 08/10/2021 at [DATE] to provider Dr. Kayy,
who verbally acknowledged these results.

## 2022-03-20 DIAGNOSIS — D631 Anemia in chronic kidney disease: Secondary | ICD-10-CM | POA: Diagnosis not present

## 2022-03-20 DIAGNOSIS — R197 Diarrhea, unspecified: Secondary | ICD-10-CM | POA: Diagnosis not present

## 2022-03-20 DIAGNOSIS — N186 End stage renal disease: Secondary | ICD-10-CM | POA: Diagnosis not present

## 2022-03-20 DIAGNOSIS — R52 Pain, unspecified: Secondary | ICD-10-CM | POA: Diagnosis not present

## 2022-03-20 DIAGNOSIS — N2581 Secondary hyperparathyroidism of renal origin: Secondary | ICD-10-CM | POA: Diagnosis not present

## 2022-03-20 DIAGNOSIS — L299 Pruritus, unspecified: Secondary | ICD-10-CM | POA: Diagnosis not present

## 2022-03-20 DIAGNOSIS — Z992 Dependence on renal dialysis: Secondary | ICD-10-CM | POA: Diagnosis not present

## 2022-03-23 DIAGNOSIS — N2581 Secondary hyperparathyroidism of renal origin: Secondary | ICD-10-CM | POA: Diagnosis not present

## 2022-03-23 DIAGNOSIS — D631 Anemia in chronic kidney disease: Secondary | ICD-10-CM | POA: Diagnosis not present

## 2022-03-23 DIAGNOSIS — L299 Pruritus, unspecified: Secondary | ICD-10-CM | POA: Diagnosis not present

## 2022-03-23 DIAGNOSIS — N186 End stage renal disease: Secondary | ICD-10-CM | POA: Diagnosis not present

## 2022-03-23 DIAGNOSIS — Z992 Dependence on renal dialysis: Secondary | ICD-10-CM | POA: Diagnosis not present

## 2022-03-23 DIAGNOSIS — R52 Pain, unspecified: Secondary | ICD-10-CM | POA: Diagnosis not present

## 2022-03-23 DIAGNOSIS — R197 Diarrhea, unspecified: Secondary | ICD-10-CM | POA: Diagnosis not present

## 2022-03-25 DIAGNOSIS — N2581 Secondary hyperparathyroidism of renal origin: Secondary | ICD-10-CM | POA: Diagnosis not present

## 2022-03-25 DIAGNOSIS — Z992 Dependence on renal dialysis: Secondary | ICD-10-CM | POA: Diagnosis not present

## 2022-03-25 DIAGNOSIS — R52 Pain, unspecified: Secondary | ICD-10-CM | POA: Diagnosis not present

## 2022-03-25 DIAGNOSIS — D631 Anemia in chronic kidney disease: Secondary | ICD-10-CM | POA: Diagnosis not present

## 2022-03-25 DIAGNOSIS — R197 Diarrhea, unspecified: Secondary | ICD-10-CM | POA: Diagnosis not present

## 2022-03-25 DIAGNOSIS — L299 Pruritus, unspecified: Secondary | ICD-10-CM | POA: Diagnosis not present

## 2022-03-25 DIAGNOSIS — N186 End stage renal disease: Secondary | ICD-10-CM | POA: Diagnosis not present

## 2022-03-26 ENCOUNTER — Other Ambulatory Visit: Payer: Self-pay

## 2022-03-26 ENCOUNTER — Encounter (HOSPITAL_COMMUNITY)
Admission: RE | Disposition: A | Discharge status: Home / Self Care | Payer: Self-pay | Source: Home / Self Care | Attending: Vascular Surgery

## 2022-03-26 ENCOUNTER — Ambulatory Visit (HOSPITAL_COMMUNITY)
Admission: RE | Admit: 2022-03-26 | Discharge: 2022-03-26 | Disposition: A | Discharge status: Home / Self Care | Payer: Medicare Other | Attending: Vascular Surgery | Admitting: Vascular Surgery

## 2022-03-26 DIAGNOSIS — E785 Hyperlipidemia, unspecified: Secondary | ICD-10-CM | POA: Insufficient documentation

## 2022-03-26 DIAGNOSIS — I509 Heart failure, unspecified: Secondary | ICD-10-CM | POA: Insufficient documentation

## 2022-03-26 DIAGNOSIS — I132 Hypertensive heart and chronic kidney disease with heart failure and with stage 5 chronic kidney disease, or end stage renal disease: Secondary | ICD-10-CM | POA: Diagnosis not present

## 2022-03-26 DIAGNOSIS — Z8673 Personal history of transient ischemic attack (TIA), and cerebral infarction without residual deficits: Secondary | ICD-10-CM | POA: Diagnosis not present

## 2022-03-26 DIAGNOSIS — Z992 Dependence on renal dialysis: Secondary | ICD-10-CM | POA: Insufficient documentation

## 2022-03-26 DIAGNOSIS — Z87891 Personal history of nicotine dependence: Secondary | ICD-10-CM | POA: Diagnosis not present

## 2022-03-26 DIAGNOSIS — I70249 Atherosclerosis of native arteries of left leg with ulceration of unspecified site: Secondary | ICD-10-CM | POA: Diagnosis not present

## 2022-03-26 DIAGNOSIS — N186 End stage renal disease: Secondary | ICD-10-CM | POA: Insufficient documentation

## 2022-03-26 DIAGNOSIS — I70245 Atherosclerosis of native arteries of left leg with ulceration of other part of foot: Secondary | ICD-10-CM | POA: Insufficient documentation

## 2022-03-26 HISTORY — PX: ABDOMINAL AORTOGRAM W/LOWER EXTREMITY: CATH118223

## 2022-03-26 HISTORY — PX: PERIPHERAL VASCULAR INTERVENTION: CATH118257

## 2022-03-26 LAB — POCT I-STAT, CHEM 8
BUN: 30 mg/dL — ABNORMAL HIGH (ref 8–23)
Calcium, Ion: 1.06 mmol/L — ABNORMAL LOW (ref 1.15–1.40)
Chloride: 90 mmol/L — ABNORMAL LOW (ref 98–111)
Creatinine, Ser: 4.2 mg/dL — ABNORMAL HIGH (ref 0.44–1.00)
Glucose, Bld: 82 mg/dL (ref 70–99)
HCT: 40 % (ref 36.0–46.0)
Hemoglobin: 13.6 g/dL (ref 12.0–15.0)
Potassium: 4.4 mmol/L (ref 3.5–5.1)
Sodium: 135 mmol/L (ref 135–145)
TCO2: 35 mmol/L — ABNORMAL HIGH (ref 22–32)

## 2022-03-26 LAB — POCT ACTIVATED CLOTTING TIME
Activated Clotting Time: 203 seconds
Activated Clotting Time: 185 seconds

## 2022-03-26 SURGERY — ABDOMINAL AORTOGRAM W/LOWER EXTREMITY
Anesthesia: LOCAL

## 2022-03-26 MED ORDER — LIDOCAINE HCL (PF) 1 % IJ SOLN
INTRAMUSCULAR | Status: AC
  Filled 2022-03-26: qty 30

## 2022-03-26 MED ORDER — HYDRALAZINE HCL 20 MG/ML IJ SOLN: 5.0000 mg | INTRAMUSCULAR | Status: DC | PRN

## 2022-03-26 MED ORDER — CLOPIDOGREL BISULFATE 300 MG PO TABS
ORAL_TABLET | ORAL | Status: DC | PRN
Start: 2022-03-26 — End: 2022-03-27
  Administered 2022-03-26: 300 mg via ORAL

## 2022-03-26 MED ORDER — NITROGLYCERIN 1 MG/10 ML FOR IR/CATH LAB
INTRA_ARTERIAL | Status: DC | PRN
  Administered 2022-03-26 (×2): 200 ug

## 2022-03-26 MED ORDER — IODIXANOL 320 MG/ML IV SOLN
INTRAVENOUS | Status: DC | PRN
  Administered 2022-03-26: 145 mL

## 2022-03-26 MED ORDER — SODIUM CHLORIDE 0.9% FLUSH: 3.0000 mL | INTRAVENOUS | Status: DC | PRN

## 2022-03-26 MED ORDER — HEPARIN (PORCINE) IN NACL 1000-0.9 UT/500ML-% IV SOLN
INTRAVENOUS | Status: AC
  Filled 2022-03-26: qty 500

## 2022-03-26 MED ORDER — SODIUM CHLORIDE 0.9% FLUSH: 3.0000 mL | Freq: Two times a day (BID) | INTRAVENOUS | Status: DC

## 2022-03-26 MED ORDER — CLOPIDOGREL BISULFATE 75 MG PO TABS: 75.0000 mg | ORAL_TABLET | Freq: Every day | ORAL | Status: DC

## 2022-03-26 MED ORDER — LABETALOL HCL 5 MG/ML IV SOLN: 10.0000 mg | INTRAVENOUS | Status: DC | PRN

## 2022-03-26 MED ORDER — HEPARIN SODIUM (PORCINE) 1000 UNIT/ML IJ SOLN
INTRAMUSCULAR | Status: AC
  Filled 2022-03-26: qty 10

## 2022-03-26 MED ORDER — CLOPIDOGREL BISULFATE 75 MG PO TABS: 300.0000 mg | ORAL_TABLET | Freq: Once | ORAL | Status: DC

## 2022-03-26 MED ORDER — CLOPIDOGREL BISULFATE 75 MG PO TABS: 75.0000 mg | ORAL_TABLET | Freq: Every day | ORAL | 11 refills | Status: DC

## 2022-03-26 MED ORDER — NITROGLYCERIN 1 MG/10 ML FOR IR/CATH LAB
INTRA_ARTERIAL | Status: AC
  Filled 2022-03-26: qty 10

## 2022-03-26 MED ORDER — HEPARIN SODIUM (PORCINE) 1000 UNIT/ML IJ SOLN
INTRAMUSCULAR | Status: DC | PRN
  Administered 2022-03-26: 5000 [IU] via INTRAVENOUS

## 2022-03-26 MED ORDER — HEPARIN (PORCINE) IN NACL 1000-0.9 UT/500ML-% IV SOLN
INTRAVENOUS | Status: DC | PRN
  Administered 2022-03-26 (×2): 500 mL

## 2022-03-26 MED ORDER — FENTANYL CITRATE (PF) 100 MCG/2ML IJ SOLN
INTRAMUSCULAR | Status: DC | PRN
  Administered 2022-03-26 (×2): 25 ug via INTRAVENOUS

## 2022-03-26 MED ORDER — FENTANYL CITRATE (PF) 100 MCG/2ML IJ SOLN
INTRAMUSCULAR | Status: AC
  Filled 2022-03-26: qty 2

## 2022-03-26 MED ORDER — SODIUM CHLORIDE 0.9 % IV SOLN
250.0000 mL | INTRAVENOUS | Status: DC | PRN
Start: 2022-03-26 — End: 2022-03-27

## 2022-03-26 MED ORDER — SODIUM CHLORIDE 0.9 % IV SOLN: 250.0000 mL | INTRAVENOUS | Status: DC | PRN

## 2022-03-26 MED ORDER — MIDAZOLAM HCL 2 MG/2ML IJ SOLN
INTRAMUSCULAR | Status: DC | PRN
  Administered 2022-03-26: 1 mg via INTRAVENOUS

## 2022-03-26 MED ORDER — ONDANSETRON HCL 4 MG/2ML IJ SOLN
4.0000 mg | Freq: Four times a day (QID) | INTRAMUSCULAR | Status: DC | PRN
Start: 2022-03-26 — End: 2022-03-27

## 2022-03-26 MED ORDER — LIDOCAINE HCL (PF) 1 % IJ SOLN
INTRAMUSCULAR | Status: DC | PRN
  Administered 2022-03-26: 12 mL

## 2022-03-26 MED ORDER — MIDAZOLAM HCL 2 MG/2ML IJ SOLN
INTRAMUSCULAR | Status: AC
  Filled 2022-03-26: qty 2

## 2022-03-26 MED ORDER — ACETAMINOPHEN 325 MG PO TABS: 650.0000 mg | ORAL_TABLET | ORAL | Status: DC | PRN

## 2022-03-26 MED ORDER — CLOPIDOGREL BISULFATE 300 MG PO TABS
ORAL_TABLET | ORAL | Status: AC
  Filled 2022-03-26: qty 1

## 2022-03-26 SURGICAL SUPPLY — 28 items
BALL STERLING OTW 2.5X150X150 (BALLOONS) ×3
BALLN MUSTANG 5.0X40 135 (BALLOONS) ×3
BALLN STERLING OTW 2.5X150X150 (BALLOONS) ×2
BALLN STERLING OTW 2X220X150 (BALLOONS) ×3
BALLN STERLING OTW 3X20X150 (BALLOONS) ×3
BALLOON MUSTANG 5.0X40 135 (BALLOONS) IMPLANT
BALLOON STERLING OTW 2X220X150 (BALLOONS) IMPLANT
BALLOON STERLING OTW 3X20X150 (BALLOONS) IMPLANT
BALLOON STRLNG OTW 2.5X150X150 (BALLOONS) IMPLANT
CATH CXI SUPP 2.6F 150 ANG (CATHETERS) ×1 IMPLANT
CATH NAVICROSS ANG 65CM (CATHETERS) IMPLANT
CATH OMNI FLUSH 5F 65CM (CATHETERS) ×1 IMPLANT
CATH QUICKCROSS SUPP .035X90CM (MICROCATHETER) ×1 IMPLANT
CATHETER NAVICROSS ANG 65CM (CATHETERS) ×3
GLIDEWIRE ADV .035X260CM (WIRE) ×1 IMPLANT
GUIDEWIRE ANGLED .035X150CM (WIRE) ×1 IMPLANT
KIT ENCORE 26 ADVANTAGE (KITS) ×1 IMPLANT
KIT MICROPUNCTURE NIT STIFF (SHEATH) ×1 IMPLANT
KIT PV (KITS) ×3 IMPLANT
SHEATH PINNACLE 5F 10CM (SHEATH) ×1 IMPLANT
SHEATH PINNACLE ST 6F 45CM (SHEATH) ×1 IMPLANT
SHEATH PROBE COVER 6X72 (BAG) ×1 IMPLANT
STENT ELUVIA 6X40X130 (Permanent Stent) ×1 IMPLANT
SYR MEDRAD MARK V 150ML (SYRINGE) ×1 IMPLANT
TRANSDUCER W/STOPCOCK (MISCELLANEOUS) ×3 IMPLANT
TRAY PV CATH (CUSTOM PROCEDURE TRAY) ×3 IMPLANT
WIRE BENTSON .035X145CM (WIRE) ×1 IMPLANT
WIRE G V18X300CM (WIRE) ×1 IMPLANT

## 2022-03-26 NOTE — Progress Notes (Signed)
ACT 185 prepare for sheath pull  ?

## 2022-03-26 NOTE — Discharge Instructions (Signed)
Good results after intervention on left leg today.  Stent placed in the superficial femoral artery in the left leg and balloon used to stretch open the peroneal artery below the knee.  Will need new prescription for Plavix sent to your pharmacy please take 75 mg Plavix, 81 mg aspirin and statin daily.  We will arrange follow-up in 1 month in the office.  Let us know if you have any problems.  Hopefully we have improved your blood flow to give you a chance of wound healing. ?

## 2022-03-26 NOTE — H&P (Signed)
History and Physical Interval Note: ? ?03/26/2022 ?12:00 PM ? ?[name]  has presented today for surgery, with the diagnosis of critical limb ischemia of left lower extremity with ulceration of foot.  The various methods of treatment have been discussed with the patient and family. After consideration of risks, benefits and other options for treatment, the patient has consented to  Procedure(s): ?ABDOMINAL AORTOGRAM W/LOWER EXTREMITY (N/A) as a surgical intervention.  The patient's history has been reviewed, patient examined, no change in status, stable for surgery.  I have reviewed the patient's chart and labs.  Questions were answered to the patient's satisfaction.   ? ? ?Marty Heck ? ?Patient name: [name]       MRN: [id number]        DOB: 1951-08-25          Sex: female ?  ?REASON FOR CONSULT: Non-healing wound left foot ?  ?HPI: ?[name] is a 71 y.o. female, with history of end-stage renal disease on hemodialysis Monday Wednesday Friday, hypertension, hyperlipidemia, CVA that presents for evaluation of nonhealing wound to the left foot.  She is here with family and states this wound has been present since September 2022.  They have been applying Vaseline to the wound with minimal improvement.  She is ambulatory with a walker.  States she lives with her mother.  States she does not smoke.  Not a diabetic.  No previous lower extremity interventions. ?  ?    ?Past Medical History:  ?Diagnosis Date  ? Adenomatous polyp of colon 10/2010, 2006, 2015  ? Anemia in CKD (chronic kidney disease) 11/07/2012  ?  s/p blood transfusion.   ? Arthritis    ? CAD (coronary artery disease)    ?  "something like that"  ? CHF (congestive heart failure) ([place])    ? Constipation    ? Depression with anxiety    ? Diverticula, colon    ? ESRD (end stage renal disease) ([place]) 11/07/2012  ?  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  Had some early rejection then stable  function for years, then had slow decline of function and went back on hemodialysis in 2012.  Gets HD TTS schedule at [facility] still using L forearm AVF.     ? GERD (gastroesophageal reflux disease)    ? GI bleed 2017  ?  felt to be ischemic colitis, last colo 2015  ? Headache    ? Hyperlipidemia    ? Hypertension    ? Neurologic gait dysfunction    ? Neuromuscular disorder ([place])    ?  neuropathy hand and legs  ? Osteoporosis    ? Pneumonia    ? Pseudoaneurysm of surgical AV fistula ([place])    ?  left upper arm  ? Pulmonary edema 12/2019  ? Stroke [facility]) 11/2015  ?  TIA  ? Weight loss, unintentional    ?  ?  ?     ?Past Surgical History:  ?Procedure Laterality Date  ? AV FISTULA PLACEMENT      ?  for dialysis  ? AV FISTULA PLACEMENT Left 11/22/2015  ?  Procedure: ARTERIOVENOUS (AV) FISTULA CREATION-LEFT BRACHIOCEPHALIC;  Surgeon: Serafina Mitchell, MD;  Location: [place];  Service: Vascular;  Laterality: Left;  ? AV FISTULA PLACEMENT Right 03/15/2020  ?  Procedure: INSERTION OF ARTERIOVENOUS (AV) GORE-TEX GRAFT ARM ( BRACHIAL AXILLARY );  Surgeon: Katha Cabal, MD;  Location: [facility];  Service: Vascular;  Laterality: Right;  ? BACK SURGERY      ? CERVICAL FUSION      ? CHOLECYSTECTOMY   12/02/2012  ?  Procedure: LAPAROSCOPIC CHOLECYSTECTOMY WITH INTRAOPERATIVE CHOLANGIOGRAM;  Surgeon: Edward Jolly, MD;  [facility];  Service: General;  Laterality: N/A;  ? EYE SURGERY Bilateral    ?  cataract surgery  ? EYE SURGERY Left 2019  ?  laser  ? HEMATOMA EVACUATION Left 12/24/2016  ?  Procedure: EVACUATION HEMATOMA LEFT UPPER ARM;  Surgeon: Waynetta Sandy, MD;  Location: [place];  Service: Vascular;  Laterality: Left;  ? I & D EXTREMITY Left 12/31/2016  ?  Procedure: IRRIGATION AND DEBRIDEMENT EXTREMITY;  Surgeon: Angelia Mould, MD;  Location: [place];  Service: Vascular;  Laterality: Left;  ? INSERTION OF DIALYSIS CATHETER Right 12/24/2016  ?  Procedure: INSERTION OF DIALYSIS CATHETER;   Surgeon: Waynetta Sandy, MD;  Location: [place];  Service: Vascular;  Laterality: Right;  ? INSERTION OF DIALYSIS CATHETER Right 02/04/2017  ?  Procedure: INSERTION OF DIALYSIS CATHETER;  Surgeon: Waynetta Sandy, MD;  Location: [place];  Service: Vascular;  Laterality: Right;  ? KIDNEY TRANSPLANT   1996  ? PERIPHERAL VASCULAR CATHETERIZATION Left 10/23/2016  ?  Procedure: Fistulagram;  Surgeon: Elam Dutch, MD;  Location: [place] CV LAB;  Service: Cardiovascular;  Laterality: Left;  ? PERIPHERAL VASCULAR THROMBECTOMY Right 04/16/2020  ?  Procedure: PERIPHERAL VASCULAR THROMBECTOMY;  Surgeon: Katha Cabal, MD;  Location: [place] CV LAB;  Service: Cardiovascular;  Laterality: Right;  ? RESECTION OF ARTERIOVENOUS FISTULA ANEURYSM Left 11/22/2015  ?  Procedure: RESECTION OF LEFT RADIOCEPHALIC FISTULA ANEURYSM ;  Surgeon: Serafina Mitchell, MD;  Location: [place];  Service: Vascular;  Laterality: Left;  ? REVISON OF ARTERIOVENOUS FISTULA Left 12/22/2016  ?  Procedure: REVISON OF LEFT ARTERIOVENOUS FISTULA;  Surgeon: Waynetta Sandy, MD;  Location: [place];  Service: Vascular;  Laterality: Left;  ? REVISON OF ARTERIOVENOUS FISTULA Left 02/04/2017  ?  Procedure: REVISON OF LEFT UPPER ARM ARTERIOVENOUS FISTULA;  Surgeon: Waynetta Sandy, MD;  Location: [place];  Service: Vascular;  Laterality: Left;  ? UPPER EXTREMITY ANGIOGRAPHY Bilateral 09/19/2019  ?  Procedure: UPPER EXTREMITY ANGIOGRAPHY;  Surgeon: Katha Cabal, MD;  Location: [place] CV LAB;  Service: Cardiovascular;  Laterality: Bilateral;  ?  ?  ?     ?Family History  ?Problem Relation Age of Onset  ? Colon cancer Brother    ? Cancer Brother    ? Coronary artery disease Mother 26  ? Hyperlipidemia Mother    ? Hypertension Mother    ? Stroke Maternal Aunt    ? Esophageal cancer Neg Hx    ? Stomach cancer Neg Hx    ? Rectal cancer Neg Hx    ?  ?  ?SOCIAL HISTORY: ?Social History  ?  ?     ?Socioeconomic History  ?  Marital status: Widowed  ?    Spouse name: Not on file  ? Number of children: 2  ? Years of education: Not on file  ? Highest education level: Not on file  ?Occupational History  ? Not on file  ?Tobacco Use  ? Smoking status: Former  ?    Types: Cigarettes  ?    Quit date: 12/31/1991  ?    Years since quitting: 30.2  ? Smokeless tobacco: Never  ?Vaping Use  ? Vaping  Use: Never used  ?Substance and Sexual Activity  ? Alcohol use: No  ?    Alcohol/week: 0.0 standard drinks  ? Drug use: No  ? Sexual activity: Not Currently  ?Other Topics Concern  ? Not on file  ?Social History Narrative  ?  Living with her mother   ?  Right handed  ?  Caffeine: only decaf clears ("no brown sodas" or coffee d/t kidney disease)  ?  Low potassium foods d/t kidney disease  ?  ?Social Determinants of Health  ?  ?Financial Resource Strain: Not on file  ?Food Insecurity: Not on file  ?Transportation Needs: Not on file  ?Physical Activity: Not on file  ?Stress: Not on file  ?Social Connections: Not on file  ?Intimate Partner Violence: Not on file  ?  ?  ?     ?Allergies  ?Allergen Reactions  ? Sulfa Antibiotics Other (See Comments)  ?    Per patient, both parents allergic-so will not take  ? Adhesive [Tape] Itching  ?  ?  ?      ?Current Outpatient Medications  ?Medication Sig Dispense Refill  ? acetaminophen (TYLENOL) 500 MG tablet Take 1 tablet (500 mg total) by mouth every 6 (six) hours as needed. (Patient taking differently: Take 500 mg by mouth every 6 (six) hours as needed for moderate pain or headache.) 30 tablet 0  ? acidophilus (RISAQUAD) CAPS capsule Take 2 capsules by mouth 3 (three) times daily. 180 capsule 0  ? albuterol (VENTOLIN HFA) 108 (90 Base) MCG/ACT inhaler Inhale 1-2 puffs into the lungs every 6 (six) hours as needed for wheezing or shortness of breath. 1 each 0  ? allopurinol (ZYLOPRIM) 300 MG tablet Take 150 mg by mouth daily.      ? ALPRAZolam (XANAX) 0.25 MG tablet Take 1 tablet (0.25 mg total) by mouth daily as needed  for anxiety. 10 tablet 0  ? aspirin EC 81 MG tablet Take 81 mg by mouth at bedtime.      ? atorvastatin (LIPITOR) 40 MG tablet Take 1 tablet (40 mg total) by mouth daily. 90 tablet 3  ? B Complex-C-Folic

## 2022-03-26 NOTE — Op Note (Signed)
? ? ?Patient name: [name] MRN: [id number] DOB: [birth year] Sex: female ? ?03/26/2022 ?Pre-operative Diagnosis: Critical limb ischemia of the left lower extremity with tissue loss ?Post-operative diagnosis:  Same ?Surgeon:  Marty Heck, MD ?Procedure Performed: ?1.  Ultrasound-guided access right common femoral artery ?2.  Aortogram with catheter selection of aorta ?3.  Left lower extremity arteriogram with selection of third order branches ?4.  Left distal SFA/above-knee popliteal artery angioplasty with stent placement (6 mm x 40 mm drug-coated Eluvia postdilated with a 5 mm Mustang) ?5.  Left peroneal angioplasty (2.0 mm x 220 mm Sterling, 2.5 x 220 mm Sterling, and 3.0 mm x 20 mm Sterling) ?6.  76 minutes of monitored moderate conscious sedation time ? ?Indications: Patient is a 71 year old female with end-stage renal disease that was seen with left lower extremity nonhealing foot wound.  She presents today for aortogram, lower extremity arteriogram, and possible intervention after risks benefits discussed. ? ?Findings:  ? ?Aortogram showed heavily calcified arteries with no flow-limiting stenosis in the aortoiliac segment.  The left renal artery was not visualized.   ? ?Left lower extremity arteriogram showed a patent common femoral with a very small and diseased profunda.  The SFA was diffusely diseased and calcified and there was a high-grade 70 to 80% stenosis at Hunter's canal in the distal SFA/above-knee popliteal artery.  Dominant runoff was in the peroneal artery.  This had multiple areas of calcified chronic total occlusion and the artery was small.  The posterior tibial artery occluded in the mid calf with no runoff into the foot.  The anterior tibial had long segment chronic total occlusion including second occlusion at the ankle.  ? ?The distal left SFA/above-knee popliteal artery was stented with a 6 mm x 40 mm drug-coated Eluvia postdilated with a 5 mm Mustang.  The peroneal was  crossed antegrade and then angioplastied initially with a 2 mm Sterling throughout the length of the vessel.  There was a heavily calcified napkin ring stenosis >90% in the proximal peroneal that I then treated with a 3 mm x 20 mm Sterling with much better results.  I then angioplastied the rest of the peroneal with a long 2.5 mm Sterling.  Inline flow down the left lower extremity through the peroneal now. ?  ?Procedure:  The patient was identified in the holding area and taken to room 8.  The patient was then placed supine on the table and prepped and draped in the usual sterile fashion.  A time out was called.  Ultrasound was used to evaluate the right common femoral artery.  It was patent .  A digital ultrasound image was acquired.  A micropuncture needle was used to access the right common femoral artery under ultrasound guidance.  An 018 wire was advanced without resistance and a micropuncture sheath was placed.  The 018 wire was removed and a benson wire was placed.  The micropuncture sheath was exchanged for a 5 french sheath.  An omniflush catheter was advanced over the wire to the level of L-1.  An abdominal angiogram was obtained.  Next, using the omniflush catheter and a benson wire, the aortic bifurcation was crossed and the catheter was placed into theleft external iliac artery and left runoff was obtained.  After evaluating images, elected for intervention of the left lower extremity.  A Glidewire advantage was then used to get down the left SFA and I put a long 6 [place] Ansell sheath in the right groin over the aortic  bifurcation.  Patient was given 100 units/kg IV heparin.  I then used a quick cross with my Glidewire advantage to cross the distal SFA above-knee popliteal stenosis and this was stented with a 6 mm x 40 mm Eluvia postdilated with a 5 mm Mustang.  I then exchanged for a V18 wire with a CXI catheter and was able to get down the peroneal that was diseased with multiple segments of chronic  total occlusion got all the way down to the ankle.  I then angioplastied the entire vessel with a 2 mm x 220 mm Sterling.  There was a very tight high-grade stenosis in the proximal vessel that did not expand with a 2 mm balloon, so I used a 3 mm x 20 mm Sterling for more radial force with much better results.  I then treated the entire peroneal with a 2.5 mm Sterling.  Excellent results.  Nitro was given.  In-line flow down the left peroneal now.  Wires and catheters removed.  Taken to holding for sheath removal. ? ?Plan: Patient will need aspirin Plavix for dual antiplatelet therapy.  Already on statin.  We will arrange follow-up in 1 month. ? ? ?Marty Heck, MD ?[facility] ?Office: [phone] ? ? ?

## 2022-03-26 NOTE — Progress Notes (Signed)
Per Dr. Carlis Abbott, ok to shorten bedrest to 3.5 hours and ambulate patient at 2100. Bedrest began at 1730. Will continue to monitor.  ?

## 2022-03-26 NOTE — Progress Notes (Signed)
Site area: Right groin a 6 french  arterial sheath wasz removed by Moishe Spice [facility]IS  ? ?Site Prior to Removal:  Level 0 ? ?Pressure Applied For 30 MINUTES   ? ?Bedrest Beginning at 1730 pm ? ?Manual:   Yes.   ? ?Patient Status During Pull:  stable ? ?Post Pull Groin Site:  Level 0 ? ?Post Pull Instructions Given:  Yes.   ? ?Post Pull Pulses Present:  Yes.   ? ?Dressing Applied:  Yes.   ? ?Comments:    ?

## 2022-03-26 NOTE — Progress Notes (Signed)
ACT 203 repeat in 30 minutes ?

## 2022-03-27 ENCOUNTER — Encounter (HOSPITAL_COMMUNITY): Payer: Self-pay | Admitting: Vascular Surgery

## 2022-03-27 DIAGNOSIS — L299 Pruritus, unspecified: Secondary | ICD-10-CM | POA: Diagnosis not present

## 2022-03-27 DIAGNOSIS — R197 Diarrhea, unspecified: Secondary | ICD-10-CM | POA: Diagnosis not present

## 2022-03-27 DIAGNOSIS — Z992 Dependence on renal dialysis: Secondary | ICD-10-CM | POA: Diagnosis not present

## 2022-03-27 DIAGNOSIS — N186 End stage renal disease: Secondary | ICD-10-CM | POA: Diagnosis not present

## 2022-03-27 DIAGNOSIS — R52 Pain, unspecified: Secondary | ICD-10-CM | POA: Diagnosis not present

## 2022-03-27 DIAGNOSIS — N2581 Secondary hyperparathyroidism of renal origin: Secondary | ICD-10-CM | POA: Diagnosis not present

## 2022-03-27 DIAGNOSIS — D631 Anemia in chronic kidney disease: Secondary | ICD-10-CM | POA: Diagnosis not present

## 2022-03-27 LAB — POCT ACTIVATED CLOTTING TIME
Activated Clotting Time: 239 seconds
Activated Clotting Time: 221 seconds

## 2022-03-30 DIAGNOSIS — D631 Anemia in chronic kidney disease: Secondary | ICD-10-CM | POA: Diagnosis not present

## 2022-03-30 DIAGNOSIS — Z992 Dependence on renal dialysis: Secondary | ICD-10-CM | POA: Diagnosis not present

## 2022-03-30 DIAGNOSIS — N186 End stage renal disease: Secondary | ICD-10-CM | POA: Diagnosis not present

## 2022-03-30 DIAGNOSIS — L299 Pruritus, unspecified: Secondary | ICD-10-CM | POA: Diagnosis not present

## 2022-03-30 DIAGNOSIS — R197 Diarrhea, unspecified: Secondary | ICD-10-CM | POA: Diagnosis not present

## 2022-03-30 DIAGNOSIS — R52 Pain, unspecified: Secondary | ICD-10-CM | POA: Diagnosis not present

## 2022-03-30 DIAGNOSIS — N2581 Secondary hyperparathyroidism of renal origin: Secondary | ICD-10-CM | POA: Diagnosis not present

## 2022-04-01 DIAGNOSIS — D631 Anemia in chronic kidney disease: Secondary | ICD-10-CM | POA: Diagnosis not present

## 2022-04-01 DIAGNOSIS — Z992 Dependence on renal dialysis: Secondary | ICD-10-CM | POA: Diagnosis not present

## 2022-04-01 DIAGNOSIS — L299 Pruritus, unspecified: Secondary | ICD-10-CM | POA: Diagnosis not present

## 2022-04-01 DIAGNOSIS — N186 End stage renal disease: Secondary | ICD-10-CM | POA: Diagnosis not present

## 2022-04-01 DIAGNOSIS — N2581 Secondary hyperparathyroidism of renal origin: Secondary | ICD-10-CM | POA: Diagnosis not present

## 2022-04-01 DIAGNOSIS — R52 Pain, unspecified: Secondary | ICD-10-CM | POA: Diagnosis not present

## 2022-04-01 DIAGNOSIS — R197 Diarrhea, unspecified: Secondary | ICD-10-CM | POA: Diagnosis not present

## 2022-04-03 DIAGNOSIS — R52 Pain, unspecified: Secondary | ICD-10-CM | POA: Diagnosis not present

## 2022-04-03 DIAGNOSIS — R197 Diarrhea, unspecified: Secondary | ICD-10-CM | POA: Diagnosis not present

## 2022-04-03 DIAGNOSIS — N186 End stage renal disease: Secondary | ICD-10-CM | POA: Diagnosis not present

## 2022-04-03 DIAGNOSIS — N2581 Secondary hyperparathyroidism of renal origin: Secondary | ICD-10-CM | POA: Diagnosis not present

## 2022-04-03 DIAGNOSIS — D631 Anemia in chronic kidney disease: Secondary | ICD-10-CM | POA: Diagnosis not present

## 2022-04-03 DIAGNOSIS — L299 Pruritus, unspecified: Secondary | ICD-10-CM | POA: Diagnosis not present

## 2022-04-03 DIAGNOSIS — Z992 Dependence on renal dialysis: Secondary | ICD-10-CM | POA: Diagnosis not present

## 2022-04-06 DIAGNOSIS — L299 Pruritus, unspecified: Secondary | ICD-10-CM | POA: Diagnosis not present

## 2022-04-06 DIAGNOSIS — R52 Pain, unspecified: Secondary | ICD-10-CM | POA: Diagnosis not present

## 2022-04-06 DIAGNOSIS — Z992 Dependence on renal dialysis: Secondary | ICD-10-CM | POA: Diagnosis not present

## 2022-04-06 DIAGNOSIS — R197 Diarrhea, unspecified: Secondary | ICD-10-CM | POA: Diagnosis not present

## 2022-04-06 DIAGNOSIS — N186 End stage renal disease: Secondary | ICD-10-CM | POA: Diagnosis not present

## 2022-04-06 DIAGNOSIS — N2581 Secondary hyperparathyroidism of renal origin: Secondary | ICD-10-CM | POA: Diagnosis not present

## 2022-04-06 DIAGNOSIS — D631 Anemia in chronic kidney disease: Secondary | ICD-10-CM | POA: Diagnosis not present

## 2022-04-08 DIAGNOSIS — N2581 Secondary hyperparathyroidism of renal origin: Secondary | ICD-10-CM | POA: Diagnosis not present

## 2022-04-08 DIAGNOSIS — R52 Pain, unspecified: Secondary | ICD-10-CM | POA: Diagnosis not present

## 2022-04-08 DIAGNOSIS — D631 Anemia in chronic kidney disease: Secondary | ICD-10-CM | POA: Diagnosis not present

## 2022-04-08 DIAGNOSIS — L299 Pruritus, unspecified: Secondary | ICD-10-CM | POA: Diagnosis not present

## 2022-04-08 DIAGNOSIS — N186 End stage renal disease: Secondary | ICD-10-CM | POA: Diagnosis not present

## 2022-04-08 DIAGNOSIS — R197 Diarrhea, unspecified: Secondary | ICD-10-CM | POA: Diagnosis not present

## 2022-04-08 DIAGNOSIS — Z992 Dependence on renal dialysis: Secondary | ICD-10-CM | POA: Diagnosis not present

## 2022-04-10 DIAGNOSIS — N186 End stage renal disease: Secondary | ICD-10-CM | POA: Diagnosis not present

## 2022-04-10 DIAGNOSIS — N2581 Secondary hyperparathyroidism of renal origin: Secondary | ICD-10-CM | POA: Diagnosis not present

## 2022-04-10 DIAGNOSIS — Z992 Dependence on renal dialysis: Secondary | ICD-10-CM | POA: Diagnosis not present

## 2022-04-10 DIAGNOSIS — D631 Anemia in chronic kidney disease: Secondary | ICD-10-CM | POA: Diagnosis not present

## 2022-04-10 DIAGNOSIS — R197 Diarrhea, unspecified: Secondary | ICD-10-CM | POA: Diagnosis not present

## 2022-04-10 DIAGNOSIS — L299 Pruritus, unspecified: Secondary | ICD-10-CM | POA: Diagnosis not present

## 2022-04-10 DIAGNOSIS — R52 Pain, unspecified: Secondary | ICD-10-CM | POA: Diagnosis not present

## 2022-04-12 DIAGNOSIS — T8612 Kidney transplant failure: Secondary | ICD-10-CM | POA: Diagnosis not present

## 2022-04-12 DIAGNOSIS — N186 End stage renal disease: Secondary | ICD-10-CM | POA: Diagnosis not present

## 2022-04-12 DIAGNOSIS — Z992 Dependence on renal dialysis: Secondary | ICD-10-CM | POA: Diagnosis not present

## 2022-04-13 DIAGNOSIS — L299 Pruritus, unspecified: Secondary | ICD-10-CM | POA: Diagnosis not present

## 2022-04-13 DIAGNOSIS — R52 Pain, unspecified: Secondary | ICD-10-CM | POA: Diagnosis not present

## 2022-04-13 DIAGNOSIS — N186 End stage renal disease: Secondary | ICD-10-CM | POA: Diagnosis not present

## 2022-04-13 DIAGNOSIS — Z992 Dependence on renal dialysis: Secondary | ICD-10-CM | POA: Diagnosis not present

## 2022-04-13 DIAGNOSIS — N2581 Secondary hyperparathyroidism of renal origin: Secondary | ICD-10-CM | POA: Diagnosis not present

## 2022-04-14 ENCOUNTER — Other Ambulatory Visit: Payer: Self-pay

## 2022-04-14 DIAGNOSIS — I70245 Atherosclerosis of native arteries of left leg with ulceration of other part of foot: Secondary | ICD-10-CM

## 2022-04-15 DIAGNOSIS — R52 Pain, unspecified: Secondary | ICD-10-CM | POA: Diagnosis not present

## 2022-04-15 DIAGNOSIS — N186 End stage renal disease: Secondary | ICD-10-CM | POA: Diagnosis not present

## 2022-04-15 DIAGNOSIS — N2581 Secondary hyperparathyroidism of renal origin: Secondary | ICD-10-CM | POA: Diagnosis not present

## 2022-04-15 DIAGNOSIS — Z992 Dependence on renal dialysis: Secondary | ICD-10-CM | POA: Diagnosis not present

## 2022-04-15 DIAGNOSIS — L299 Pruritus, unspecified: Secondary | ICD-10-CM | POA: Diagnosis not present

## 2022-04-17 DIAGNOSIS — L299 Pruritus, unspecified: Secondary | ICD-10-CM | POA: Diagnosis not present

## 2022-04-17 DIAGNOSIS — N2581 Secondary hyperparathyroidism of renal origin: Secondary | ICD-10-CM | POA: Diagnosis not present

## 2022-04-17 DIAGNOSIS — Z992 Dependence on renal dialysis: Secondary | ICD-10-CM | POA: Diagnosis not present

## 2022-04-17 DIAGNOSIS — N186 End stage renal disease: Secondary | ICD-10-CM | POA: Diagnosis not present

## 2022-04-17 DIAGNOSIS — R52 Pain, unspecified: Secondary | ICD-10-CM | POA: Diagnosis not present

## 2022-04-20 DIAGNOSIS — R52 Pain, unspecified: Secondary | ICD-10-CM | POA: Diagnosis not present

## 2022-04-20 DIAGNOSIS — N186 End stage renal disease: Secondary | ICD-10-CM | POA: Diagnosis not present

## 2022-04-20 DIAGNOSIS — L299 Pruritus, unspecified: Secondary | ICD-10-CM | POA: Diagnosis not present

## 2022-04-20 DIAGNOSIS — N2581 Secondary hyperparathyroidism of renal origin: Secondary | ICD-10-CM | POA: Diagnosis not present

## 2022-04-20 DIAGNOSIS — Z992 Dependence on renal dialysis: Secondary | ICD-10-CM | POA: Diagnosis not present

## 2022-04-22 DIAGNOSIS — L299 Pruritus, unspecified: Secondary | ICD-10-CM | POA: Diagnosis not present

## 2022-04-22 DIAGNOSIS — R52 Pain, unspecified: Secondary | ICD-10-CM | POA: Diagnosis not present

## 2022-04-22 DIAGNOSIS — N2581 Secondary hyperparathyroidism of renal origin: Secondary | ICD-10-CM | POA: Diagnosis not present

## 2022-04-22 DIAGNOSIS — Z992 Dependence on renal dialysis: Secondary | ICD-10-CM | POA: Diagnosis not present

## 2022-04-22 DIAGNOSIS — N186 End stage renal disease: Secondary | ICD-10-CM | POA: Diagnosis not present

## 2022-04-24 DIAGNOSIS — R52 Pain, unspecified: Secondary | ICD-10-CM | POA: Diagnosis not present

## 2022-04-24 DIAGNOSIS — N2581 Secondary hyperparathyroidism of renal origin: Secondary | ICD-10-CM | POA: Diagnosis not present

## 2022-04-24 DIAGNOSIS — Z992 Dependence on renal dialysis: Secondary | ICD-10-CM | POA: Diagnosis not present

## 2022-04-24 DIAGNOSIS — N186 End stage renal disease: Secondary | ICD-10-CM | POA: Diagnosis not present

## 2022-04-24 DIAGNOSIS — L299 Pruritus, unspecified: Secondary | ICD-10-CM | POA: Diagnosis not present

## 2022-04-26 ENCOUNTER — Inpatient Hospital Stay (HOSPITAL_COMMUNITY): Payer: Medicare Other

## 2022-04-26 ENCOUNTER — Encounter (HOSPITAL_COMMUNITY): Payer: Self-pay | Admitting: Emergency Medicine

## 2022-04-26 ENCOUNTER — Inpatient Hospital Stay (HOSPITAL_COMMUNITY)
Admission: EM | Admit: 2022-04-26 | Discharge: 2022-05-04 | DRG: 252 | Disposition: A | Discharge status: Skilled Nursing Facility | Payer: Medicare Other | Attending: Internal Medicine | Admitting: Internal Medicine

## 2022-04-26 ENCOUNTER — Emergency Department (HOSPITAL_COMMUNITY): Payer: Medicare Other

## 2022-04-26 DIAGNOSIS — J9601 Acute respiratory failure with hypoxia: Secondary | ICD-10-CM | POA: Diagnosis not present

## 2022-04-26 DIAGNOSIS — M810 Age-related osteoporosis without current pathological fracture: Secondary | ICD-10-CM | POA: Diagnosis not present

## 2022-04-26 DIAGNOSIS — I5032 Chronic diastolic (congestive) heart failure: Secondary | ICD-10-CM | POA: Diagnosis present

## 2022-04-26 DIAGNOSIS — Z9049 Acquired absence of other specified parts of digestive tract: Secondary | ICD-10-CM | POA: Diagnosis not present

## 2022-04-26 DIAGNOSIS — A4102 Sepsis due to Methicillin resistant Staphylococcus aureus: Secondary | ICD-10-CM | POA: Diagnosis not present

## 2022-04-26 DIAGNOSIS — Z86010 Personal history of colonic polyps: Secondary | ICD-10-CM

## 2022-04-26 DIAGNOSIS — N189 Chronic kidney disease, unspecified: Secondary | ICD-10-CM | POA: Diagnosis present

## 2022-04-26 DIAGNOSIS — Z79899 Other long term (current) drug therapy: Secondary | ICD-10-CM

## 2022-04-26 DIAGNOSIS — Z981 Arthrodesis status: Secondary | ICD-10-CM

## 2022-04-26 DIAGNOSIS — J90 Pleural effusion, not elsewhere classified: Secondary | ICD-10-CM | POA: Diagnosis not present

## 2022-04-26 DIAGNOSIS — A4101 Sepsis due to Methicillin susceptible Staphylococcus aureus: Secondary | ICD-10-CM | POA: Diagnosis not present

## 2022-04-26 DIAGNOSIS — E785 Hyperlipidemia, unspecified: Secondary | ICD-10-CM | POA: Diagnosis not present

## 2022-04-26 DIAGNOSIS — B9561 Methicillin susceptible Staphylococcus aureus infection as the cause of diseases classified elsewhere: Secondary | ICD-10-CM

## 2022-04-26 DIAGNOSIS — I132 Hypertensive heart and chronic kidney disease with heart failure and with stage 5 chronic kidney disease, or end stage renal disease: Secondary | ICD-10-CM | POA: Diagnosis present

## 2022-04-26 DIAGNOSIS — I959 Hypotension, unspecified: Secondary | ICD-10-CM | POA: Diagnosis not present

## 2022-04-26 DIAGNOSIS — Z823 Family history of stroke: Secondary | ICD-10-CM

## 2022-04-26 DIAGNOSIS — R7881 Bacteremia: Secondary | ICD-10-CM | POA: Diagnosis not present

## 2022-04-26 DIAGNOSIS — Y832 Surgical operation with anastomosis, bypass or graft as the cause of abnormal reaction of the patient, or of later complication, without mention of misadventure at the time of the procedure: Secondary | ICD-10-CM | POA: Diagnosis present

## 2022-04-26 DIAGNOSIS — K219 Gastro-esophageal reflux disease without esophagitis: Secondary | ICD-10-CM | POA: Diagnosis not present

## 2022-04-26 DIAGNOSIS — Z20822 Contact with and (suspected) exposure to covid-19: Secondary | ICD-10-CM | POA: Diagnosis present

## 2022-04-26 DIAGNOSIS — I7121 Aneurysm of the ascending aorta, without rupture: Secondary | ICD-10-CM | POA: Diagnosis present

## 2022-04-26 DIAGNOSIS — N186 End stage renal disease: Secondary | ICD-10-CM | POA: Diagnosis not present

## 2022-04-26 DIAGNOSIS — Z91048 Other nonmedicinal substance allergy status: Secondary | ICD-10-CM

## 2022-04-26 DIAGNOSIS — J9811 Atelectasis: Secondary | ICD-10-CM | POA: Diagnosis not present

## 2022-04-26 DIAGNOSIS — J189 Pneumonia, unspecified organism: Secondary | ICD-10-CM | POA: Diagnosis not present

## 2022-04-26 DIAGNOSIS — R0902 Hypoxemia: Principal | ICD-10-CM

## 2022-04-26 DIAGNOSIS — Z940 Kidney transplant status: Secondary | ICD-10-CM

## 2022-04-26 DIAGNOSIS — Z992 Dependence on renal dialysis: Secondary | ICD-10-CM | POA: Diagnosis not present

## 2022-04-26 DIAGNOSIS — R000 Tachycardia, unspecified: Secondary | ICD-10-CM | POA: Diagnosis not present

## 2022-04-26 DIAGNOSIS — I2510 Atherosclerotic heart disease of native coronary artery without angina pectoris: Secondary | ICD-10-CM | POA: Diagnosis not present

## 2022-04-26 DIAGNOSIS — A419 Sepsis, unspecified organism: Secondary | ICD-10-CM | POA: Diagnosis not present

## 2022-04-26 DIAGNOSIS — Z8673 Personal history of transient ischemic attack (TIA), and cerebral infarction without residual deficits: Secondary | ICD-10-CM

## 2022-04-26 DIAGNOSIS — I10 Essential (primary) hypertension: Secondary | ICD-10-CM | POA: Diagnosis not present

## 2022-04-26 DIAGNOSIS — T827XXD Infection and inflammatory reaction due to other cardiac and vascular devices, implants and grafts, subsequent encounter: Secondary | ICD-10-CM | POA: Diagnosis not present

## 2022-04-26 DIAGNOSIS — Z882 Allergy status to sulfonamides status: Secondary | ICD-10-CM

## 2022-04-26 DIAGNOSIS — Z7902 Long term (current) use of antithrombotics/antiplatelets: Secondary | ICD-10-CM

## 2022-04-26 DIAGNOSIS — R1111 Vomiting without nausea: Secondary | ICD-10-CM | POA: Diagnosis not present

## 2022-04-26 DIAGNOSIS — R6521 Severe sepsis with septic shock: Secondary | ICD-10-CM | POA: Diagnosis not present

## 2022-04-26 DIAGNOSIS — T827XXA Infection and inflammatory reaction due to other cardiac and vascular devices, implants and grafts, initial encounter: Principal | ICD-10-CM

## 2022-04-26 DIAGNOSIS — I120 Hypertensive chronic kidney disease with stage 5 chronic kidney disease or end stage renal disease: Secondary | ICD-10-CM | POA: Diagnosis not present

## 2022-04-26 DIAGNOSIS — I252 Old myocardial infarction: Secondary | ICD-10-CM | POA: Diagnosis not present

## 2022-04-26 DIAGNOSIS — Z7982 Long term (current) use of aspirin: Secondary | ICD-10-CM

## 2022-04-26 DIAGNOSIS — Z87891 Personal history of nicotine dependence: Secondary | ICD-10-CM

## 2022-04-26 DIAGNOSIS — N185 Chronic kidney disease, stage 5: Secondary | ICD-10-CM

## 2022-04-26 DIAGNOSIS — Z8249 Family history of ischemic heart disease and other diseases of the circulatory system: Secondary | ICD-10-CM

## 2022-04-26 DIAGNOSIS — D631 Anemia in chronic kidney disease: Secondary | ICD-10-CM | POA: Diagnosis present

## 2022-04-26 DIAGNOSIS — N250 Renal osteodystrophy: Secondary | ICD-10-CM | POA: Diagnosis not present

## 2022-04-26 DIAGNOSIS — J811 Chronic pulmonary edema: Secondary | ICD-10-CM | POA: Diagnosis not present

## 2022-04-26 DIAGNOSIS — T82838A Hemorrhage of vascular prosthetic devices, implants and grafts, initial encounter: Secondary | ICD-10-CM | POA: Diagnosis not present

## 2022-04-26 DIAGNOSIS — R0602 Shortness of breath: Secondary | ICD-10-CM | POA: Diagnosis not present

## 2022-04-26 DIAGNOSIS — I700 Atherosclerosis of aorta: Secondary | ICD-10-CM | POA: Diagnosis not present

## 2022-04-26 HISTORY — DX: Acute on chronic diastolic (congestive) heart failure: I50.33

## 2022-04-26 LAB — CBC WITH DIFFERENTIAL/PLATELET
Abs Immature Granulocytes: 0.06 10*3/uL (ref 0.00–0.07)
Basophils Absolute: 0 10*3/uL (ref 0.0–0.1)
Basophils Relative: 0 %
Eosinophils Absolute: 0 10*3/uL (ref 0.0–0.5)
Eosinophils Relative: 0 %
HCT: 31.1 % — ABNORMAL LOW (ref 36.0–46.0)
Hemoglobin: 10.5 g/dL — ABNORMAL LOW (ref 12.0–15.0)
Immature Granulocytes: 1 %
Lymphocytes Relative: 4 %
Lymphs Abs: 0.5 10*3/uL — ABNORMAL LOW (ref 0.7–4.0)
MCH: 32.7 pg (ref 26.0–34.0)
MCHC: 33.8 g/dL (ref 30.0–36.0)
MCV: 96.9 fL (ref 80.0–100.0)
Monocytes Absolute: 0.9 10*3/uL (ref 0.1–1.0)
Monocytes Relative: 8 %
Neutro Abs: 9.8 10*3/uL — ABNORMAL HIGH (ref 1.7–7.7)
Neutrophils Relative %: 87 %
Platelets: 184 10*3/uL (ref 150–400)
RBC: 3.21 MIL/uL — ABNORMAL LOW (ref 3.87–5.11)
RDW: 15.8 % — ABNORMAL HIGH (ref 11.5–15.5)
WBC: 11.3 10*3/uL — ABNORMAL HIGH (ref 4.0–10.5)
nRBC: 0 % (ref 0.0–0.2)

## 2022-04-26 LAB — I-STAT CHEM 8, ED
BUN: 35 mg/dL — ABNORMAL HIGH (ref 8–23)
Calcium, Ion: 0.99 mmol/L — ABNORMAL LOW (ref 1.15–1.40)
Chloride: 95 mmol/L — ABNORMAL LOW (ref 98–111)
Creatinine, Ser: 6.3 mg/dL — ABNORMAL HIGH (ref 0.44–1.00)
Glucose, Bld: 125 mg/dL — ABNORMAL HIGH (ref 70–99)
HCT: 33 % — ABNORMAL LOW (ref 36.0–46.0)
Hemoglobin: 11.2 g/dL — ABNORMAL LOW (ref 12.0–15.0)
Potassium: 4.2 mmol/L (ref 3.5–5.1)
Sodium: 132 mmol/L — ABNORMAL LOW (ref 135–145)
TCO2: 25 mmol/L (ref 22–32)

## 2022-04-26 LAB — COMPREHENSIVE METABOLIC PANEL
ALT: 16 U/L (ref 0–44)
AST: 27 U/L (ref 15–41)
Albumin: 2.9 g/dL — ABNORMAL LOW (ref 3.5–5.0)
Alkaline Phosphatase: 121 U/L (ref 38–126)
Anion gap: 13 (ref 5–15)
BUN: 39 mg/dL — ABNORMAL HIGH (ref 8–23)
CO2: 26 mmol/L (ref 22–32)
Calcium: 8.9 mg/dL (ref 8.9–10.3)
Chloride: 95 mmol/L — ABNORMAL LOW (ref 98–111)
Creatinine, Ser: 5.92 mg/dL — ABNORMAL HIGH (ref 0.44–1.00)
GFR, Estimated: 7 mL/min — ABNORMAL LOW (ref >60)
Glucose, Bld: 125 mg/dL — ABNORMAL HIGH (ref 70–99)
Potassium: 4.2 mmol/L (ref 3.5–5.1)
Sodium: 134 mmol/L — ABNORMAL LOW (ref 135–145)
Total Bilirubin: 1.3 mg/dL — ABNORMAL HIGH (ref 0.3–1.2)
Total Protein: 6.8 g/dL (ref 6.5–8.1)

## 2022-04-26 LAB — APTT: aPTT: 34 seconds (ref 24–36)

## 2022-04-26 LAB — CBG MONITORING, ED: Glucose-Capillary: 114 mg/dL — ABNORMAL HIGH (ref 70–99)

## 2022-04-26 LAB — RESP PANEL BY RT-PCR (FLU A&B, COVID) ARPGX2
Influenza A by PCR: NEGATIVE
Influenza B by PCR: NEGATIVE
SARS Coronavirus 2 by RT PCR: NEGATIVE

## 2022-04-26 LAB — LACTIC ACID, PLASMA: Lactic Acid, Venous: 1.5 mmol/L (ref 0.5–1.9)

## 2022-04-26 MED ORDER — LACTATED RINGERS IV SOLN: INTRAVENOUS | Status: DC

## 2022-04-26 MED ORDER — ACETAMINOPHEN 325 MG PO TABS
650.0000 mg | ORAL_TABLET | Freq: Once | ORAL | Status: DC
  Filled 2022-04-26: qty 2

## 2022-04-26 MED ORDER — ACETAMINOPHEN 650 MG RE SUPP
650.0000 mg | Freq: Once | RECTAL | Status: AC
  Administered 2022-04-26: 650 mg via RECTAL
  Filled 2022-04-26: qty 1

## 2022-04-26 MED ORDER — SODIUM CHLORIDE 0.9 % IV SOLN: 1.0000 g | INTRAVENOUS | Status: DC

## 2022-04-26 MED ORDER — SODIUM CHLORIDE 0.9 % IV SOLN: 2.0000 g | Freq: Once | INTRAVENOUS | Status: DC

## 2022-04-26 MED ORDER — SODIUM CHLORIDE 0.9 % IV SOLN
2.0000 g | Freq: Once | INTRAVENOUS | Status: AC
  Administered 2022-04-26: 2 g via INTRAVENOUS
  Filled 2022-04-26: qty 12.5

## 2022-04-26 MED ORDER — VANCOMYCIN HCL 500 MG/100ML IV SOLN: 500.0000 mg | INTRAVENOUS | Status: DC

## 2022-04-26 MED ORDER — VANCOMYCIN HCL IN DEXTROSE 1-5 GM/200ML-% IV SOLN
1000.0000 mg | Freq: Once | INTRAVENOUS | Status: AC
  Administered 2022-04-26: 1000 mg via INTRAVENOUS
  Filled 2022-04-26: qty 200

## 2022-04-26 MED ORDER — SODIUM CHLORIDE 0.9 % IV BOLUS (SEPSIS)
500.0000 mL | Freq: Once | INTRAVENOUS | Status: AC
  Administered 2022-04-26: 500 mL via INTRAVENOUS

## 2022-04-26 MED ORDER — SODIUM CHLORIDE 0.9 % IV SOLN
1.0000 g | INTRAVENOUS | Status: DC
  Filled 2022-04-26: qty 10

## 2022-04-26 NOTE — ED Notes (Signed)
Admitting provider bedside, unwrapped right arm at access and found an open wound that was slowly oozing blood.  Provider documented and rewrapped arm.   ?

## 2022-04-26 NOTE — Progress Notes (Signed)
Pharmacy Antibiotic Note ? ?[name] is a 71 y.o. female admitted on 04/26/2022 with pneumonia.  Pharmacy has been consulted for cefepime dosing. ? ?Of note, patient has a h/o ESRD on T/Th/Sat HD. Last HD was yesterday  ? ?WBC mildly elevated  ? ?Plan: ?-Cefepime 2 gm IV now followed by cefepime 1 gm IV Q 24 hours  ?-F/u need for vancomycin maintenance dose ?-Monitor CBC, renal fx, cultures and clinical progress ? ?  ? ?Temp (24hrs), Avg:101.6 ?F (38.7 ?C), Min:101.6 ?F (38.7 ?C), Max:101.6 ?F (38.7 ?C) ? ?No results for input(s): WBC, CREATININE, LATICACIDVEN, VANCOTROUGH, VANCOPEAK, VANCORANDOM, GENTTROUGH, GENTPEAK, GENTRANDOM, TOBRATROUGH, TOBRAPEAK, TOBRARND, AMIKACINPEAK, AMIKACINTROU, AMIKACIN in the last 168 hours.  ?CrCl cannot be calculated (Patient's most recent lab result is older than the maximum 21 days allowed.).   ? ?Allergies  ?Allergen Reactions  ? Sulfa Antibiotics Other (See Comments)  ?  Per patient, both parents allergic-so will not take  ? Adhesive [Tape] Itching  ? ? ?Antimicrobials this admission: ?Cefepime 5/14 >>  ?Vancomycin 5/14 >>  ? ?Dose adjustments this admission: ? ? ?Microbiology results: ?5/14 BCx:  ? ? ?Thank you for allowing pharmacy to be a part of this patient?s care. ? ?Albertina Parr, PharmD., BCCCP ?Clinical Pharmacist ?Please refer to [facility] for unit-specific pharmacist  ? ?

## 2022-04-26 NOTE — ED Notes (Signed)
RN removed IV that was placed by EMS in right forearm. ?

## 2022-04-26 NOTE — Assessment & Plan Note (Signed)
Euvolemic. On HD. Holding BP meds due to borderline low BP. ?

## 2022-04-26 NOTE — Assessment & Plan Note (Addendum)
Pt's sister called and told ED RN that pt has had pus draining from right AVF site. Sister reported pt had "bug bite" on AVF site and it has been draining pus and blood.  Continue with IV cefepime and vanco. May need vascular surgery to evaluate AVF site. May need alternate HD site if nephrology thinks primary AVF site is infected. Defer to nephrology. ? ? ? ? ? ? ?

## 2022-04-26 NOTE — H&P (Signed)
?History and Physical  ? ? ?[name] TXM:[id number] DOB: September 18, 1951 DOA: 04/26/2022 ? ?DOS: the patient was seen and examined on 04/26/2022 ? ?PCP: [name], MD  ? ?Patient coming from: Home ? ?I have personally briefly reviewed patient's old medical records in [facility] ? ?CC: SOB, hypoxia ?HPI: ?71 year old African-American female history of end-stage renal disease, chronic diastolic heart failure, anemia chronic kidney disease, prevascular disease status post recent angioplasty of the left lower leg with stenting who presents to the ER today with chief complaint of shortness of breath.  Patient unable to give any history due to excessive somnolence.  Reportedly patient told her family she has been short of breath.  EMS was called.  Room air saturations were 88%.  Placed on 2 L of oxygen. ? ?Patient reportedly had dialysis on Saturday. ? ?Patient's sister called up to the ER and talk to the ER nurse.  She relayed to the ER nurse that the patient had a "bug bite" on the AV fistula on her right upper arm.  This was bleeding and draining pus according to the family. ? ?Patient febrile to one 1.6 in the ER ? ?Work-up in the ER: ?Lactic acid normal 1.5 ? ?Sodium 134, BUN of 39, creatinine 5.9, potassium 4.2 ? ?White count 11.3, hemoglobin 10.5, platelets 184 ? ? ?Chest x-ray is read as cardiomegaly and chronic central vascular congestion.  No airspace disease. ? ?Due to patient's continued hypoxia, fever [facility] hospitalist contacted for admission.  ? ?ED Course: febrile in ER 101.3, hypoxia.  CXR negative for pneumonia. ? ?Review of Systems:  ?Review of Systems  ?Unable to perform ROS: Mental status change  ? ?Past Medical History:  ?Diagnosis Date  ? Acute ischemic stroke ([place]) 02/23/2017  ? Acute on chronic diastolic CHF (congestive heart failure) ([place])   ? Adenomatous polyp of colon 10/2010, 2006, 2015  ? Anemia in CKD (chronic kidney disease) 11/07/2012  ? s/p blood transfusion.   ? Arthritis   ?  CAD (coronary artery disease)   ? "something like that"  ? CHF (congestive heart failure) ([place])   ? Constipation   ? Critical limb ischemia of left lower extremity with ulceration of foot ([place]) 03/17/2022  ? Depression with anxiety   ? Diverticula, colon   ? Diverticulitis of colon with perforation 08/06/2021  ? ESRD (end stage renal disease) ([place]) 11/07/2012  ? 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  Had some early rejection then stable function for years, then had slow decline of function and went back on hemodialysis in 2012.  Gets HD TTS schedule at [facility] still using L forearm AVF.     ? GERD (gastroesophageal reflux disease)   ? GI bleed 2017  ? felt to be ischemic colitis, last colo 2015  ? Headache   ? Hyperlipidemia   ? Hypertension   ? Neurologic gait dysfunction   ? Neuromuscular disorder ([place])   ? neuropathy hand and legs  ? NSTEMI (non-ST elevated myocardial infarction) ([place]) 05/14/2020  ? Osteoporosis   ? Pneumonia   ? Pseudoaneurysm of surgical AV fistula ([place])   ? left upper arm  ? Pulmonary edema 12/2019  ? Stroke [facility]) 11/2015  ? TIA  ? TIA (transient ischemic attack) 02/21/2021  ? Weight loss, unintentional   ? ? ?Past Surgical History:  ?Procedure Laterality Date  ? ABDOMINAL AORTOGRAM W/LOWER EXTREMITY N/A 03/26/2022  ? Procedure: ABDOMINAL AORTOGRAM W/LOWER EXTREMITY;  Surgeon: Marty Heck, MD;  Location: [place] [facility];  Service: Cardiovascular;  Laterality: N/A;  Left  ? AV FISTULA PLACEMENT    ? for dialysis  ? AV FISTULA PLACEMENT Left 11/22/2015  ? Procedure: ARTERIOVENOUS (AV) FISTULA CREATION-LEFT BRACHIOCEPHALIC;  Surgeon: Serafina Mitchell, MD;  Location: [place];  Service: Vascular;  Laterality: Left;  ? AV FISTULA PLACEMENT Right 03/15/2020  ? Procedure: INSERTION OF ARTERIOVENOUS (AV) GORE-TEX GRAFT ARM ( BRACHIAL AXILLARY );  Surgeon: Katha Cabal, MD;  Location: [facility] ORS;  Service: Vascular;  Laterality: Right;  ? BACK  SURGERY    ? CERVICAL FUSION    ? CHOLECYSTECTOMY  12/02/2012  ? Procedure: LAPAROSCOPIC CHOLECYSTECTOMY WITH INTRAOPERATIVE CHOLANGIOGRAM;  Surgeon: Edward Jolly, MD;  Location: [facility];  Service: General;  Laterality: N/A;  ? EYE SURGERY Bilateral   ? cataract surgery  ? EYE SURGERY Left 2019  ? laser  ? HEMATOMA EVACUATION Left 12/24/2016  ? Procedure: EVACUATION HEMATOMA LEFT UPPER ARM;  Surgeon: Waynetta Sandy, MD;  Location: [place];  Service: Vascular;  Laterality: Left;  ? I & D EXTREMITY Left 12/31/2016  ? Procedure: IRRIGATION AND DEBRIDEMENT EXTREMITY;  Surgeon: Angelia Mould, MD;  Location: [place];  Service: Vascular;  Laterality: Left;  ? INSERTION OF DIALYSIS CATHETER Right 12/24/2016  ? Procedure: INSERTION OF DIALYSIS CATHETER;  Surgeon: Waynetta Sandy, MD;  Location: [place];  Service: Vascular;  Laterality: Right;  ? INSERTION OF DIALYSIS CATHETER Right 02/04/2017  ? Procedure: INSERTION OF DIALYSIS CATHETER;  Surgeon: Waynetta Sandy, MD;  Location: [place];  Service: Vascular;  Laterality: Right;  ? KIDNEY TRANSPLANT  1996  ? PERIPHERAL VASCULAR CATHETERIZATION Left 10/23/2016  ? Procedure: Fistulagram;  Surgeon: Elam Dutch, MD;  Location: [place] CV [facility];  Service: Cardiovascular;  Laterality: Left;  ? PERIPHERAL VASCULAR INTERVENTION  03/26/2022  ? Procedure: PERIPHERAL VASCULAR INTERVENTION;  Surgeon: Marty Heck, MD;  Location: [place] CV LAB;  Service: Cardiovascular;;  left sfa  ? PERIPHERAL VASCULAR THROMBECTOMY Right 04/16/2020  ? Procedure: PERIPHERAL VASCULAR THROMBECTOMY;  Surgeon: Katha Cabal, MD;  Location: [place] CV [facility];  Service: Cardiovascular;  Laterality: Right;  ? RESECTION OF ARTERIOVENOUS FISTULA ANEURYSM Left 11/22/2015  ? Procedure: RESECTION OF LEFT RADIOCEPHALIC FISTULA ANEURYSM ;  Surgeon: Serafina Mitchell, MD;  Location: [place];  Service: Vascular;  Laterality: Left;  ? REVISON OF ARTERIOVENOUS FISTULA Left  12/22/2016  ? Procedure: REVISON OF LEFT ARTERIOVENOUS FISTULA;  Surgeon: Waynetta Sandy, MD;  Location: [place];  Service: Vascular;  Laterality: Left;  ? REVISON OF ARTERIOVENOUS FISTULA Left 02/04/2017  ? Procedure: REVISON OF LEFT UPPER ARM ARTERIOVENOUS FISTULA;  Surgeon: Waynetta Sandy, MD;  Location: [place];  Service: Vascular;  Laterality: Left;  ? UPPER EXTREMITY ANGIOGRAPHY Bilateral 09/19/2019  ? Procedure: UPPER EXTREMITY ANGIOGRAPHY;  Surgeon: Katha Cabal, MD;  Location: [place] [facility];  Service: Cardiovascular;  Laterality: Bilateral;  ? ? ? reports that she quit smoking about 30 years ago. Her smoking use included cigarettes. She has never used smokeless tobacco. She reports that she does not drink alcohol and does not use drugs. ? ?Allergies  ?Allergen Reactions  ? Sulfa Antibiotics Other (See Comments)  ?  Per patient, both parents allergic-so will not take  ? Adhesive [Tape] Itching  ? ? ?Family History  ?Problem Relation Age of Onset  ? Colon cancer Brother   ? Cancer Brother   ?  Coronary artery disease Mother 43  ? Hyperlipidemia Mother   ? Hypertension Mother   ? Stroke Maternal Aunt   ? Esophageal cancer Neg Hx   ? Stomach cancer Neg Hx   ? Rectal cancer Neg Hx   ? ? ?Prior to Admission medications   ?Medication Sig Start Date End Date Taking? Authorizing Provider  ?acetaminophen (TYLENOL) 500 MG tablet Take 1 tablet (500 mg total) by mouth every 6 (six) hours as needed. ?Patient taking differently: Take 500 mg by mouth every 6 (six) hours as needed for moderate pain or headache. 09/05/18   Frederica Kuster, PA-C  ?acidophilus (RISAQUAD) CAPS capsule Take 2 capsules by mouth 3 (three) times daily. ?Patient not taking: Reported on 03/26/2022 01/24/22   Swayze, Ava, DO  ?albuterol (VENTOLIN HFA) 108 (90 Base) MCG/ACT inhaler Inhale 1-2 puffs into the lungs every 6 (six) hours as needed for wheezing or shortness of breath. 01/11/22   Mesner, Corene Cornea, MD  ?allopurinol (ZYLOPRIM)  300 MG tablet Take 150 mg by mouth daily. ?Patient not taking: Reported on 03/26/2022 11/03/21   [provider]  ?ALPRAZolam Duanne Moron) 0.25 MG tablet Take 1 tablet (0.25 mg total) by mouth daily

## 2022-04-26 NOTE — Assessment & Plan Note (Signed)
Admit to inpatient bed. CT chest to rule out PNA as the cause of her hypoxia. Does not appear to be volume overloaded. ?

## 2022-04-26 NOTE — Assessment & Plan Note (Signed)
BP borderline low in ER. Will hold BP meds for now. ?

## 2022-04-26 NOTE — ED Triage Notes (Signed)
Per EMS, family called out to home for SOB (Pt denies).  She was 88% RA placed on 2L improved to 99%.  Pt c/o weakness "all day."  Pt gets dialysis on Tues, Thur and Sat, having gotten a full treatment on Saturday.   ? ?90/64 after 300LR ?CBG 115 ?22G R forearm ?Dialysis access in both arms, unsure of what one she uses.  PB taken on thigh.   ? ?22 R Forearm. ? ?

## 2022-04-26 NOTE — ED Notes (Signed)
Provider aware of antibiotics started w/o 2nd set of blood cultures.   ?

## 2022-04-26 NOTE — Assessment & Plan Note (Signed)
Stable

## 2022-04-26 NOTE — Assessment & Plan Note (Signed)
Had HD on T, Th, Sat. Will need nephrology consult. ?

## 2022-04-26 NOTE — ED Notes (Signed)
Pt's sister called and informed RN that pt has a wound (bug bite) on her right dialysis access.  Dialysis did use the access but "went above it."  Sister states that pt was getting puss out of wound yesterday. ?

## 2022-04-26 NOTE — Progress Notes (Signed)
Pharmacy Antibiotic Note ? ?[name] is a 71 y.o. female admitted on 04/26/2022 with pneumonia.  Pharmacy has been consulted for cefepime and vancomycin dosing. ? ?Of note, patient has a h/o ESRD on T/Th/Sat HD. Last HD was yesterday  ? ?WBC mildly elevated  ? ?Plan: ?-Cefepime 2 gm IV now followed by cefepime 1 gm IV Q 24 hours  ?-Vancomycin '500mg'$  QHD  ?-Monitor CBC, renal fx, cultures and clinical progress ? ?  ? ?Temp (24hrs), Avg:100.1 ?F (37.8 ?C), Min:98.5 ?F (36.9 ?C), Max:101.6 ?F (38.7 ?C) ? ?Recent Labs  ?Lab 04/26/22 ?2040 04/26/22 ?2054  ?WBC 11.3*  --   ?CREATININE 5.92* 6.30*  ?LATICACIDVEN 1.5  --   ?  ?CrCl cannot be calculated (Unknown ideal weight.).   ? ?Allergies  ?Allergen Reactions  ? Sulfa Antibiotics Other (See Comments)  ?  Per patient, both parents allergic-so will not take  ? Adhesive [Tape] Itching  ? ? ?Antimicrobials this admission: ?Cefepime 5/14 >>  ?Vancomycin 5/14 >>  ? ?Dose adjustments this admission: ? ? ?Microbiology results: ?5/14 BCx:  ? ? ?[name], PharmD ?Clinical Pharmacist ?04/26/2022 11:14 PM ?Please check AMION for all [place] numbers ? ? ?

## 2022-04-26 NOTE — ED Provider Notes (Signed)
?[facility] ?Provider Note ? ? ?CSN: [id number] ?Arrival date & time: 04/26/22  1942 ? ?  ? ?History ? ?Chief Complaint  ?Patient presents with  ? Shortness of Breath  ? Weakness  ? ? ?[name] is a 71 y.o. female history of ESRD on HD (last HD was yesterday), here presenting with fever and weakness and shortness of breath.  Patient states that she woke up today and had chills.  She states that she had cough as well. She felt very short of breath so EMS was called.  Patient was noted to be hypoxic to 88% on room air and was put on 2 L nasal cannula.  Patient is not on oxygen at home at baseline.  Patient uses right arm fistula for dialysis ? ?The history is provided by the patient and the EMS personnel.  ? ?  ? ?Home Medications ?Prior to Admission medications   ?Medication Sig Start Date End Date Taking? Authorizing Provider  ?acetaminophen (TYLENOL) 500 MG tablet Take 1 tablet (500 mg total) by mouth every 6 (six) hours as needed. ?Patient taking differently: Take 500 mg by mouth every 6 (six) hours as needed for moderate pain or headache. 09/05/18   Frederica Kuster, PA-C  ?acidophilus (RISAQUAD) CAPS capsule Take 2 capsules by mouth 3 (three) times daily. ?Patient not taking: Reported on 03/26/2022 01/24/22   Swayze, Ava, DO  ?albuterol (VENTOLIN HFA) 108 (90 Base) MCG/ACT inhaler Inhale 1-2 puffs into the lungs every 6 (six) hours as needed for wheezing or shortness of breath. 01/11/22   Mesner, Corene Cornea, MD  ?allopurinol (ZYLOPRIM) 300 MG tablet Take 150 mg by mouth daily. ?Patient not taking: Reported on 03/26/2022 11/03/21   [provider]  ?ALPRAZolam Duanne Moron) 0.25 MG tablet Take 1 tablet (0.25 mg total) by mouth daily as needed for anxiety. ?Patient not taking: Reported on 03/26/2022 08/18/21   Nita Sells, MD  ?aspirin EC 81 MG tablet Take 81 mg by mouth at bedtime.    [provider]  ?atorvastatin (LIPITOR) 40 MG tablet Take 1 tablet (40 mg  total) by mouth daily. ?Patient not taking: Reported on 03/26/2022 01/13/22   Burnell Blanks, MD  ?atorvastatin (LIPITOR) 80 MG tablet Take 80 mg by mouth daily. ?Patient not taking: Reported on 03/26/2022 03/25/22   [provider]  ?AURYXIA 1 GM 210 MG(Fe) tablet Take 420 mg by mouth 3 (three) times daily. ?Patient not taking: Reported on 03/26/2022 03/11/22   [provider]  ?B Complex-C-Folic Acid (DIALYVITE 834) 0.8 MG WAFR Take 1 tablet by mouth daily. ?Patient not taking: Reported on 03/26/2022 01/28/22   [provider]  ?carvedilol (COREG) 6.25 MG tablet Take 6.25 mg by mouth 2 (two) times daily. ?Patient not taking: Reported on 03/26/2022 03/25/22   [provider]  ?clopidogrel (PLAVIX) 75 MG tablet Take 1 tablet (75 mg total) by mouth daily. 03/26/22 03/26/23  Marty Heck, MD  ?escitalopram (LEXAPRO) 10 MG tablet Take 10 mg by mouth daily. ?Patient not taking: Reported on 03/26/2022 03/25/22   [provider]  ?feeding supplement (ENSURE ENLIVE / ENSURE PLUS) LIQD Take 237 mLs by mouth 2 (two) times daily between meals. ?Patient not taking: Reported on 03/26/2022 01/24/22   Swayze, Ava, DO  ?losartan (COZAAR) 50 MG tablet Take 50 mg by mouth daily. 03/25/22   [provider]  ?metoprolol tartrate (LOPRESSOR) 25 MG tablet Take 0.5 tablets (12.5 mg total) by mouth 2 (two) times daily.  01/24/22   Swayze, Ava, DO  ?ondansetron (ZOFRAN) 8 MG tablet Take 8 mg by mouth 2 (two) times daily as needed for nausea or vomiting.  ?Patient not taking: Reported on 03/26/2022    [provider]  ?pantoprazole (PROTONIX) 40 MG tablet Take 1 tablet (40 mg total) by mouth daily. ?Patient not taking: Reported on 03/26/2022 08/18/21   Nita Sells, MD  ?polyethylene glycol (MIRALAX / GLYCOLAX) 17 g packet Take 17 g by mouth 2 (two) times daily. ?Patient not taking: Reported on 03/26/2022 01/24/22   Swayze, Ava, DO  ?sevelamer (RENAGEL) 800 MG tablet Take 1,600  mg by mouth 3 (three) times daily. ?Patient not taking: Reported on 03/26/2022 03/24/22   [provider]  ?zolpidem (AMBIEN) 10 MG tablet Take 10 mg by mouth at bedtime as needed for sleep. 02/10/22   [provider]  ?   ? ?Allergies    ?Sulfa antibiotics and Adhesive [tape]   ? ?Review of Systems   ?Review of Systems  ?Respiratory:  Positive for shortness of breath.   ?Neurological:  Positive for weakness.  ?All other systems reviewed and are negative. ? ?Physical Exam ?Updated Vital Signs ?BP (!) 98/44   Pulse 80   Temp (!) 101.6 ?F (38.7 ?C) (Rectal)   Resp 13   SpO2 96%  ?Physical Exam ?Vitals and nursing note reviewed.  ?Constitutional:   ?   Comments: Chronically ill, weak all over  ?HENT:  ?   Head: Normocephalic.  ?   Mouth/Throat:  ?   Pharynx: Oropharynx is clear.  ?Eyes:  ?   Extraocular Movements: Extraocular movements intact.  ?   Pupils: Pupils are equal, round, and reactive to light.  ?Cardiovascular:  ?   Rate and Rhythm: Normal rate and regular rhythm.  ?Pulmonary:  ?   Comments: Tachypneic, crackles bilateral bases ?Musculoskeletal:     ?   General: Normal range of motion.  ?   Cervical back: Normal range of motion and neck supple.  ?   Comments: Patient has dialysis fistula some bilateral arms  ?Skin: ?   General: Skin is warm.  ?   Capillary Refill: Capillary refill takes less than 2 seconds.  ?Neurological:  ?   Comments: Confused, moving all extremities  ?Psychiatric:     ?   Mood and Affect: Mood normal.     ?   Behavior: Behavior normal.  ? ? ?ED Results / Procedures / Treatments   ?Labs ?(all labs ordered are listed, but only abnormal results are displayed) ?Labs Reviewed  ?CBC WITH DIFFERENTIAL/PLATELET - Abnormal; Notable for the following components:  ?    Result Value  ? WBC 11.3 (*)   ? RBC 3.21 (*)   ? Hemoglobin 10.5 (*)   ? HCT 31.1 (*)   ? RDW 15.8 (*)   ? Neutro Abs 9.8 (*)   ? Lymphs Abs 0.5 (*)   ? All other components within normal limits  ?I-STAT CHEM 8, ED  - Abnormal; Notable for the following components:  ? Sodium 132 (*)   ? Chloride 95 (*)   ? BUN 35 (*)   ? Creatinine, Ser 6.30 (*)   ? Glucose, Bld 125 (*)   ? Calcium, Ion 0.99 (*)   ? Hemoglobin 11.2 (*)   ? HCT 33.0 (*)   ? All other components within normal limits  ?RESP PANEL BY RT-PCR (FLU A&B, COVID) [id number]  ?CULTURE, BLOOD (ROUTINE X 2)  ?CULTURE, BLOOD (ROUTINE X 2)  ?  APTT  ?LACTIC ACID, PLASMA  ?LACTIC ACID, PLASMA  ?COMPREHENSIVE METABOLIC PANEL  ? ? ?EKG ?None ? ?Radiology ?DG Chest Port 1 View ? ?Result Date: 04/26/2022 ?CLINICAL DATA:  Short of breath, hypoxia EXAM: PORTABLE CHEST 1 VIEW COMPARISON:  02/08/2022 FINDINGS: Single frontal view of the chest demonstrate stable marked enlargement of the cardiac silhouette. Stable ectasia and atherosclerosis of the thoracic aorta. Chronic central vascular congestion without acute airspace disease, effusion, or pneumothorax. No acute bony abnormality. IMPRESSION: 1. Chronic cardiomegaly and central vascular congestion. No acute airspace disease. Electronically Signed   By: Randa Ngo M.D.   On: 04/26/2022 20:19   ? ?Procedures ?Procedures  ? ? ?Angiocath insertion ?Performed by: Wandra Arthurs ? ?Consent: Verbal consent obtained. ?Risks and benefits: risks, benefits and alternatives were discussed ?Time out: Immediately prior to procedure a "time out" was called to verify the correct patient, procedure, equipment, support staff and site/side marked as required. ? ?Preparation: Patient was prepped and draped in the usual sterile fashion. ? ?Vein Location: R EJ ? ?Ultrasound Guided ? ?Gauge: 20 long  ? ?Normal blood return and flush without difficulty ?Patient tolerance: Patient tolerated the procedure well with no immediate complications. ? ? ? ?Medications Ordered in ED ?Medications  ?lactated ringers infusion (has no administration in time range)  ?sodium chloride 0.9 % bolus 500 mL (500 mLs Intravenous New Bag/Given 04/26/22 2057)  ?vancomycin (VANCOCIN)  IVPB 1000 mg/200 mL premix (1,000 mg Intravenous New Bag/Given 04/26/22 2100)  ?ceFEPIme (MAXIPIME) 2 g in sodium chloride 0.9 % 100 mL IVPB (has no administration in time range)  ?ceFEPIme (MAXIPIME) 1 g in

## 2022-04-26 NOTE — Subjective & Objective (Signed)
CC: SOB, hypoxia ?HPI: ?71 year old African-American female history of end-stage renal disease, chronic diastolic heart failure, anemia chronic kidney disease, prevascular disease status post recent angioplasty of the left lower leg with stenting who presents to the ER today with chief complaint of shortness of breath.  Patient unable to give any history due to excessive somnolence.  Reportedly patient told her family she has been short of breath.  EMS was called.  Room air saturations were 88%.  Placed on 2 L of oxygen. ? ?Patient reportedly had dialysis on Saturday. ? ?Patient's sister called up to the ER and talk to the ER nurse.  She relayed to the ER nurse that the patient had a "bug bite" on the AV fistula on her right upper arm.  This was bleeding and draining pus according to the family. ? ?Patient febrile to one 1.6 in the ER ? ?Work-up in the ER: ?Lactic acid normal 1.5 ? ?Sodium 134, BUN of 39, creatinine 5.9, potassium 4.2 ? ?White count 11.3, hemoglobin 10.5, platelets 184 ? ? ?Chest x-ray is read as cardiomegaly and chronic central vascular congestion.  No airspace disease. ? ?Due to patient's continued hypoxia, fever [facility] hospitalist contacted for admission. ?

## 2022-04-27 ENCOUNTER — Inpatient Hospital Stay (HOSPITAL_COMMUNITY): Payer: Medicare Other | Admitting: Certified Registered"

## 2022-04-27 ENCOUNTER — Other Ambulatory Visit: Payer: Self-pay

## 2022-04-27 ENCOUNTER — Encounter (HOSPITAL_COMMUNITY): Payer: Self-pay | Admitting: Internal Medicine

## 2022-04-27 ENCOUNTER — Encounter (HOSPITAL_COMMUNITY)
Admission: EM | Disposition: A | Discharge status: Skilled Nursing Facility | Payer: Self-pay | Source: Home / Self Care | Attending: Internal Medicine

## 2022-04-27 ENCOUNTER — Inpatient Hospital Stay (HOSPITAL_COMMUNITY): Payer: Medicare Other

## 2022-04-27 DIAGNOSIS — A419 Sepsis, unspecified organism: Secondary | ICD-10-CM | POA: Diagnosis not present

## 2022-04-27 DIAGNOSIS — J9601 Acute respiratory failure with hypoxia: Secondary | ICD-10-CM

## 2022-04-27 DIAGNOSIS — I5032 Chronic diastolic (congestive) heart failure: Secondary | ICD-10-CM

## 2022-04-27 DIAGNOSIS — Z992 Dependence on renal dialysis: Secondary | ICD-10-CM

## 2022-04-27 DIAGNOSIS — I132 Hypertensive heart and chronic kidney disease with heart failure and with stage 5 chronic kidney disease, or end stage renal disease: Secondary | ICD-10-CM

## 2022-04-27 DIAGNOSIS — I2510 Atherosclerotic heart disease of native coronary artery without angina pectoris: Secondary | ICD-10-CM

## 2022-04-27 DIAGNOSIS — N186 End stage renal disease: Secondary | ICD-10-CM

## 2022-04-27 DIAGNOSIS — B9561 Methicillin susceptible Staphylococcus aureus infection as the cause of diseases classified elsewhere: Secondary | ICD-10-CM

## 2022-04-27 DIAGNOSIS — T82838A Hemorrhage of vascular prosthetic devices, implants and grafts, initial encounter: Secondary | ICD-10-CM

## 2022-04-27 DIAGNOSIS — T82898A Other specified complication of vascular prosthetic devices, implants and grafts, initial encounter: Secondary | ICD-10-CM

## 2022-04-27 DIAGNOSIS — R7881 Bacteremia: Secondary | ICD-10-CM

## 2022-04-27 DIAGNOSIS — T827XXA Infection and inflammatory reaction due to other cardiac and vascular devices, implants and grafts, initial encounter: Secondary | ICD-10-CM | POA: Diagnosis not present

## 2022-04-27 HISTORY — PX: AVGG REMOVAL: SHX5153

## 2022-04-27 LAB — BLOOD CULTURE ID PANEL (REFLEXED) - BCID2

## 2022-04-27 LAB — COMPREHENSIVE METABOLIC PANEL
ALT: 20 U/L (ref 0–44)
ALT: 63 U/L — ABNORMAL HIGH (ref 0–44)
AST: 40 U/L (ref 15–41)
AST: 144 U/L — ABNORMAL HIGH (ref 15–41)
Albumin: 2.9 g/dL — ABNORMAL LOW (ref 3.5–5.0)
Albumin: 3.1 g/dL — ABNORMAL LOW (ref 3.5–5.0)
Alkaline Phosphatase: 101 U/L (ref 38–126)
Alkaline Phosphatase: 189 U/L — ABNORMAL HIGH (ref 38–126)
Anion gap: 17 — ABNORMAL HIGH (ref 5–15)
Anion gap: 18 — ABNORMAL HIGH (ref 5–15)
BUN: 44 mg/dL — ABNORMAL HIGH (ref 8–23)
BUN: 46 mg/dL — ABNORMAL HIGH (ref 8–23)
CO2: 24 mmol/L (ref 22–32)
CO2: 21 mmol/L — ABNORMAL LOW (ref 22–32)
Calcium: 8.9 mg/dL (ref 8.9–10.3)
Calcium: 8.3 mg/dL — ABNORMAL LOW (ref 8.9–10.3)
Chloride: 94 mmol/L — ABNORMAL LOW (ref 98–111)
Chloride: 96 mmol/L — ABNORMAL LOW (ref 98–111)
Creatinine, Ser: 6.36 mg/dL — ABNORMAL HIGH (ref 0.44–1.00)
Creatinine, Ser: 6.4 mg/dL — ABNORMAL HIGH (ref 0.44–1.00)
GFR, Estimated: 7 mL/min — ABNORMAL LOW (ref >60)
GFR, Estimated: 6 mL/min — ABNORMAL LOW (ref >60)
Glucose, Bld: 80 mg/dL (ref 70–99)
Glucose, Bld: 204 mg/dL — ABNORMAL HIGH (ref 70–99)
Potassium: 5 mmol/L (ref 3.5–5.1)
Potassium: 4.8 mmol/L (ref 3.5–5.1)
Sodium: 135 mmol/L (ref 135–145)
Sodium: 135 mmol/L (ref 135–145)
Total Bilirubin: 1.1 mg/dL (ref 0.3–1.2)
Total Bilirubin: 1.2 mg/dL (ref 0.3–1.2)
Total Protein: 6.9 g/dL (ref 6.5–8.1)
Total Protein: 6.4 g/dL — ABNORMAL LOW (ref 6.5–8.1)

## 2022-04-27 LAB — CBC
HCT: 33 % — ABNORMAL LOW (ref 36.0–46.0)
Hemoglobin: 10.9 g/dL — ABNORMAL LOW (ref 12.0–15.0)
MCH: 32.2 pg (ref 26.0–34.0)
MCHC: 33 g/dL (ref 30.0–36.0)
MCV: 97.3 fL (ref 80.0–100.0)
Platelets: 189 10*3/uL (ref 150–400)
RBC: 3.39 MIL/uL — ABNORMAL LOW (ref 3.87–5.11)
RDW: 15.9 % — ABNORMAL HIGH (ref 11.5–15.5)
WBC: 20.3 10*3/uL — ABNORMAL HIGH (ref 4.0–10.5)
nRBC: 0 % (ref 0.0–0.2)

## 2022-04-27 LAB — CBC WITH DIFFERENTIAL/PLATELET
Abs Immature Granulocytes: 0.06 10*3/uL (ref 0.00–0.07)
Basophils Absolute: 0.1 10*3/uL (ref 0.0–0.1)
Basophils Relative: 1 %
Eosinophils Absolute: 0.1 10*3/uL (ref 0.0–0.5)
Eosinophils Relative: 1 %
HCT: 36.6 % (ref 36.0–46.0)
Hemoglobin: 11.9 g/dL — ABNORMAL LOW (ref 12.0–15.0)
Immature Granulocytes: 1 %
Lymphocytes Relative: 9 %
Lymphs Abs: 0.8 10*3/uL (ref 0.7–4.0)
MCH: 31.6 pg (ref 26.0–34.0)
MCHC: 32.5 g/dL (ref 30.0–36.0)
MCV: 97.3 fL (ref 80.0–100.0)
Monocytes Absolute: 1.3 10*3/uL — ABNORMAL HIGH (ref 0.1–1.0)
Monocytes Relative: 14 %
Neutro Abs: 6.7 10*3/uL (ref 1.7–7.7)
Neutrophils Relative %: 74 %
Platelets: 167 10*3/uL (ref 150–400)
RBC: 3.76 MIL/uL — ABNORMAL LOW (ref 3.87–5.11)
RDW: 15.9 % — ABNORMAL HIGH (ref 11.5–15.5)
WBC: 9 10*3/uL (ref 4.0–10.5)
nRBC: 0 % (ref 0.0–0.2)

## 2022-04-27 LAB — MAGNESIUM
Magnesium: 2.1 mg/dL (ref 1.7–2.4)
Magnesium: 2.2 mg/dL (ref 1.7–2.4)

## 2022-04-27 LAB — GLUCOSE, CAPILLARY
Glucose-Capillary: 73 mg/dL (ref 70–99)
Glucose-Capillary: 72 mg/dL (ref 70–99)
Glucose-Capillary: 69 mg/dL — ABNORMAL LOW (ref 70–99)
Glucose-Capillary: 77 mg/dL (ref 70–99)
Glucose-Capillary: 120 mg/dL — ABNORMAL HIGH (ref 70–99)
Glucose-Capillary: 65 mg/dL — ABNORMAL LOW (ref 70–99)
Glucose-Capillary: 179 mg/dL — ABNORMAL HIGH (ref 70–99)

## 2022-04-27 LAB — POCT I-STAT 7, (LYTES, BLD GAS, ICA,H+H)
Acid-base deficit: 6 mmol/L — ABNORMAL HIGH (ref 0.0–2.0)
Acid-base deficit: 10 mmol/L — ABNORMAL HIGH (ref 0.0–2.0)
Acid-base deficit: 6 mmol/L — ABNORMAL HIGH (ref 0.0–2.0)
Acid-base deficit: 2 mmol/L (ref 0.0–2.0)
Bicarbonate: 20.6 mmol/L (ref 20.0–28.0)
Bicarbonate: 17.1 mmol/L — ABNORMAL LOW (ref 20.0–28.0)
Bicarbonate: 22 mmol/L (ref 20.0–28.0)
Bicarbonate: 23.9 mmol/L (ref 20.0–28.0)
Calcium, Ion: 1.13 mmol/L — ABNORMAL LOW (ref 1.15–1.40)
Calcium, Ion: 1.04 mmol/L — ABNORMAL LOW (ref 1.15–1.40)
Calcium, Ion: 1.04 mmol/L — ABNORMAL LOW (ref 1.15–1.40)
Calcium, Ion: 1.05 mmol/L — ABNORMAL LOW (ref 1.15–1.40)
HCT: 35 % — ABNORMAL LOW (ref 36.0–46.0)
HCT: 33 % — ABNORMAL LOW (ref 36.0–46.0)
HCT: 33 % — ABNORMAL LOW (ref 36.0–46.0)
HCT: 35 % — ABNORMAL LOW (ref 36.0–46.0)
Hemoglobin: 11.9 g/dL — ABNORMAL LOW (ref 12.0–15.0)
Hemoglobin: 11.2 g/dL — ABNORMAL LOW (ref 12.0–15.0)
Hemoglobin: 11.2 g/dL — ABNORMAL LOW (ref 12.0–15.0)
Hemoglobin: 11.9 g/dL — ABNORMAL LOW (ref 12.0–15.0)
O2 Saturation: 99 %
O2 Saturation: 89 %
O2 Saturation: 100 %
O2 Saturation: 100 %
Patient temperature: 36.2
Patient temperature: 36.5
Patient temperature: 98.6
Potassium: 4.6 mmol/L (ref 3.5–5.1)
Potassium: 4.3 mmol/L (ref 3.5–5.1)
Potassium: 3.9 mmol/L (ref 3.5–5.1)
Potassium: 4.7 mmol/L (ref 3.5–5.1)
Sodium: 133 mmol/L — ABNORMAL LOW (ref 135–145)
Sodium: 135 mmol/L (ref 135–145)
Sodium: 136 mmol/L (ref 135–145)
Sodium: 134 mmol/L — ABNORMAL LOW (ref 135–145)
TCO2: 22 mmol/L (ref 22–32)
TCO2: 18 mmol/L — ABNORMAL LOW (ref 22–32)
TCO2: 24 mmol/L (ref 22–32)
TCO2: 25 mmol/L (ref 22–32)
pCO2 arterial: 43.7 mmHg (ref 32–48)
pCO2 arterial: 41.7 mmHg (ref 32–48)
pCO2 arterial: 52 mmHg — ABNORMAL HIGH (ref 32–48)
pCO2 arterial: 42.2 mmHg (ref 32–48)
pH, Arterial: 7.277 — ABNORMAL LOW (ref 7.35–7.45)
pH, Arterial: 7.222 — ABNORMAL LOW (ref 7.35–7.45)
pH, Arterial: 7.232 — ABNORMAL LOW (ref 7.35–7.45)
pH, Arterial: 7.361 (ref 7.35–7.45)
pO2, Arterial: 142 mmHg — ABNORMAL HIGH (ref 83–108)
pO2, Arterial: 68 mmHg — ABNORMAL LOW (ref 83–108)
pO2, Arterial: 328 mmHg — ABNORMAL HIGH (ref 83–108)
pO2, Arterial: 424 mmHg — ABNORMAL HIGH (ref 83–108)

## 2022-04-27 LAB — PROCALCITONIN: Procalcitonin: 18.89 ng/mL

## 2022-04-27 LAB — HEPATITIS B SURFACE ANTIBODY,QUALITATIVE: Hep B S Ab: REACTIVE — AB

## 2022-04-27 LAB — HEPATITIS B SURFACE ANTIGEN: Hepatitis B Surface Ag: NONREACTIVE

## 2022-04-27 LAB — SURGICAL PCR SCREEN
MRSA, PCR: POSITIVE — AB
Staphylococcus aureus: POSITIVE — AB

## 2022-04-27 LAB — LACTIC ACID, PLASMA: Lactic Acid, Venous: 1.3 mmol/L (ref 0.5–1.9)

## 2022-04-27 SURGERY — REMOVAL OF ARTERIOVENOUS GORETEX GRAFT (AVGG)
Anesthesia: General | Site: Arm Upper | Laterality: Right

## 2022-04-27 MED ORDER — HEPARIN SODIUM (PORCINE) 5000 UNIT/ML IJ SOLN
5000.0000 [IU] | Freq: Three times a day (TID) | INTRAMUSCULAR | Status: DC
  Administered 2022-04-27 – 2022-05-04 (×20): 5000 [IU] via SUBCUTANEOUS
  Filled 2022-04-27 (×19): qty 1

## 2022-04-27 MED ORDER — CLOPIDOGREL BISULFATE 75 MG PO TABS
75.0000 mg | ORAL_TABLET | Freq: Every day | ORAL | Status: DC
  Administered 2022-04-28 – 2022-05-04 (×7): 75 mg via ORAL
  Filled 2022-04-27 (×7): qty 1

## 2022-04-27 MED ORDER — ETOMIDATE 2 MG/ML IV SOLN
INTRAVENOUS | Status: DC | PRN
  Administered 2022-04-27: 10 mg via INTRAVENOUS

## 2022-04-27 MED ORDER — ROCURONIUM BROMIDE 10 MG/ML (PF) SYRINGE
PREFILLED_SYRINGE | INTRAVENOUS | Status: DC | PRN
  Administered 2022-04-27: 40 mg via INTRAVENOUS

## 2022-04-27 MED ORDER — CHLORHEXIDINE GLUCONATE 0.12% ORAL RINSE (MEDLINE KIT)
15.0000 mL | Freq: Two times a day (BID) | OROMUCOSAL | Status: DC
  Administered 2022-04-27 – 2022-04-28 (×2): 15 mL via OROMUCOSAL

## 2022-04-27 MED ORDER — HEPARIN 6000 UNIT IRRIGATION SOLUTION
Status: DC | PRN
  Administered 2022-04-27: 1

## 2022-04-27 MED ORDER — FENTANYL CITRATE PF 50 MCG/ML IJ SOSY
25.0000 ug | PREFILLED_SYRINGE | INTRAMUSCULAR | Status: DC | PRN
  Administered 2022-04-27 (×2): 25 ug via INTRAVENOUS
  Filled 2022-04-27 (×2): qty 1

## 2022-04-27 MED ORDER — LACTATED RINGERS IV SOLN: INTRAVENOUS | Status: AC

## 2022-04-27 MED ORDER — CHLORHEXIDINE GLUCONATE 0.12 % MT SOLN
15.0000 mL | Freq: Once | OROMUCOSAL | Status: AC
  Administered 2022-04-27: 15 mL via OROMUCOSAL

## 2022-04-27 MED ORDER — DEXTROSE 50 % IV SOLN
INTRAVENOUS | Status: AC
  Filled 2022-04-27: qty 50

## 2022-04-27 MED ORDER — CLOTRIMAZOLE 2 % VA CREA
1.0000 | TOPICAL_CREAM | Freq: Every day | VAGINAL | Status: AC
  Administered 2022-04-27 – 2022-04-28 (×3): 1 via VAGINAL
  Filled 2022-04-27 (×2): qty 21

## 2022-04-27 MED ORDER — SODIUM CHLORIDE 0.9% FLUSH: 10.0000 mL | INTRAVENOUS | Status: DC | PRN

## 2022-04-27 MED ORDER — ALBUTEROL SULFATE (2.5 MG/3ML) 0.083% IN NEBU: 2.5000 mg | INHALATION_SOLUTION | RESPIRATORY_TRACT | Status: DC | PRN

## 2022-04-27 MED ORDER — NOREPINEPHRINE 4 MG/250ML-% IV SOLN
INTRAVENOUS | Status: DC | PRN
  Administered 2022-04-27: 5 ug/min via INTRAVENOUS

## 2022-04-27 MED ORDER — FENTANYL CITRATE (PF) 100 MCG/2ML IJ SOLN: 25.0000 ug | INTRAMUSCULAR | Status: DC | PRN

## 2022-04-27 MED ORDER — ACETAMINOPHEN 650 MG RE SUPP: 650.0000 mg | Freq: Four times a day (QID) | RECTAL | Status: DC | PRN

## 2022-04-27 MED ORDER — DOCUSATE SODIUM 50 MG/5ML PO LIQD
100.0000 mg | Freq: Two times a day (BID) | ORAL | Status: DC
  Administered 2022-04-28: 100 mg
  Filled 2022-04-27: qty 10

## 2022-04-27 MED ORDER — CHLORHEXIDINE GLUCONATE CLOTH 2 % EX PADS: 6.0000 | MEDICATED_PAD | Freq: Every day | CUTANEOUS | Status: DC

## 2022-04-27 MED ORDER — SODIUM ZIRCONIUM CYCLOSILICATE 10 G PO PACK: 10.0000 g | PACK | Freq: Three times a day (TID) | ORAL | Status: DC

## 2022-04-27 MED ORDER — PROPOFOL 1000 MG/100ML IV EMUL: 5.0000 ug/kg/min | INTRAVENOUS | Status: DC

## 2022-04-27 MED ORDER — PROPOFOL 10 MG/ML IV BOLUS
INTRAVENOUS | Status: DC | PRN
  Administered 2022-04-27: 5 ug/kg/min via INTRAVENOUS

## 2022-04-27 MED ORDER — NOREPINEPHRINE 4 MG/250ML-% IV SOLN
0.0000 ug/min | INTRAVENOUS | Status: DC
  Administered 2022-04-27: 5 ug/min via INTRAVENOUS
  Administered 2022-04-27: 9 ug/min via INTRAVENOUS
  Administered 2022-04-29: 4 ug/min via INTRAVENOUS
  Filled 2022-04-27 (×3): qty 250

## 2022-04-27 MED ORDER — LIDOCAINE-EPINEPHRINE (PF) 1 %-1:200000 IJ SOLN
INTRAMUSCULAR | Status: DC | PRN
  Administered 2022-04-27: 15 mL

## 2022-04-27 MED ORDER — PROPOFOL 1000 MG/100ML IV EMUL
5.0000 ug/kg/min | INTRAVENOUS | Status: DC
  Administered 2022-04-28: 20 ug/kg/min via INTRAVENOUS
  Filled 2022-04-27: qty 100

## 2022-04-27 MED ORDER — MIDAZOLAM HCL 2 MG/2ML IJ SOLN
INTRAMUSCULAR | Status: AC
  Filled 2022-04-27: qty 2

## 2022-04-27 MED ORDER — VASOPRESSIN 20 UNIT/ML IV SOLN
INTRAVENOUS | Status: AC
  Filled 2022-04-27: qty 1

## 2022-04-27 MED ORDER — ALBUMIN HUMAN 5 % IV SOLN: INTRAVENOUS | Status: DC | PRN

## 2022-04-27 MED ORDER — ONDANSETRON HCL 4 MG/2ML IJ SOLN
INTRAMUSCULAR | Status: DC | PRN
  Administered 2022-04-27: 4 mg via INTRAVENOUS

## 2022-04-27 MED ORDER — CEFAZOLIN SODIUM-DEXTROSE 2-3 GM-%(50ML) IV SOLR
INTRAVENOUS | Status: DC | PRN
  Administered 2022-04-27: 2 g via INTRAVENOUS

## 2022-04-27 MED ORDER — SODIUM CHLORIDE 0.9 % IV SOLN: INTRAVENOUS | Status: DC | PRN

## 2022-04-27 MED ORDER — SODIUM CHLORIDE 0.9 % IV SOLN: INTRAVENOUS | Status: DC

## 2022-04-27 MED ORDER — LIDOCAINE 2% (20 MG/ML) 5 ML SYRINGE
INTRAMUSCULAR | Status: DC | PRN
  Administered 2022-04-27: 40 mg via INTRAVENOUS

## 2022-04-27 MED ORDER — CEFAZOLIN SODIUM-DEXTROSE 1-4 GM/50ML-% IV SOLN
1.0000 g | INTRAVENOUS | Status: AC
  Administered 2022-04-27 – 2022-04-29 (×3): 1 g via INTRAVENOUS
  Filled 2022-04-27 (×6): qty 50

## 2022-04-27 MED ORDER — PROPOFOL 10 MG/ML IV BOLUS
INTRAVENOUS | Status: AC
  Filled 2022-04-27: qty 20

## 2022-04-27 MED ORDER — 0.9 % SODIUM CHLORIDE (POUR BTL) OPTIME
TOPICAL | Status: DC | PRN
  Administered 2022-04-27: 1000 mL

## 2022-04-27 MED ORDER — MUPIROCIN 2 % EX OINT
1.0000 "application " | TOPICAL_OINTMENT | Freq: Two times a day (BID) | CUTANEOUS | Status: AC
  Administered 2022-04-27 – 2022-05-01 (×9): 1 via NASAL
  Filled 2022-04-27 (×2): qty 22

## 2022-04-27 MED ORDER — EPINEPHRINE 1 MG/10ML IJ SOSY
PREFILLED_SYRINGE | INTRAMUSCULAR | Status: DC | PRN
  Administered 2022-04-27: 10 ug via INTRAVENOUS

## 2022-04-27 MED ORDER — ORAL CARE MOUTH RINSE
15.0000 mL | OROMUCOSAL | Status: DC
  Administered 2022-04-27 – 2022-04-28 (×6): 15 mL via OROMUCOSAL

## 2022-04-27 MED ORDER — LIDOCAINE-EPINEPHRINE (PF) 1 %-1:200000 IJ SOLN
INTRAMUSCULAR | Status: AC
  Filled 2022-04-27: qty 30

## 2022-04-27 MED ORDER — HEPARIN 6000 UNIT IRRIGATION SOLUTION
Status: AC
  Filled 2022-04-27: qty 500

## 2022-04-27 MED ORDER — POLYETHYLENE GLYCOL 3350 17 G PO PACK: 17.0000 g | PACK | Freq: Every day | ORAL | Status: DC

## 2022-04-27 MED ORDER — HEPARIN SODIUM (PORCINE) 1000 UNIT/ML IJ SOLN
INTRAMUSCULAR | Status: DC | PRN
  Administered 2022-04-27: 5000 [IU] via INTRAVENOUS

## 2022-04-27 MED ORDER — SODIUM BICARBONATE 8.4 % IV SOLN
INTRAVENOUS | Status: DC | PRN
  Administered 2022-04-27: 50 meq via INTRAVENOUS

## 2022-04-27 MED ORDER — DEXTROSE 50 % IV SOLN
12.5000 g | Freq: Once | INTRAVENOUS | Status: AC
Start: 2022-04-27 — End: 2022-04-27
  Administered 2022-04-27: 12.5 g via INTRAVENOUS

## 2022-04-27 MED ORDER — CHLORHEXIDINE GLUCONATE CLOTH 2 % EX PADS
6.0000 | MEDICATED_PAD | Freq: Every day | CUTANEOUS | Status: AC
  Administered 2022-04-28 – 2022-05-02 (×5): 6 via TOPICAL

## 2022-04-27 MED ORDER — PHENYLEPHRINE HCL-NACL 20-0.9 MG/250ML-% IV SOLN
INTRAVENOUS | Status: DC | PRN
  Administered 2022-04-27: 30 ug/min via INTRAVENOUS

## 2022-04-27 MED ORDER — NOREPINEPHRINE BITARTRATE 1 MG/ML IV SOLN
INTRAVENOUS | Status: DC | PRN
  Administered 2022-04-27: 1 mL via INTRAVENOUS

## 2022-04-27 MED ORDER — FENTANYL CITRATE (PF) 250 MCG/5ML IJ SOLN
INTRAMUSCULAR | Status: DC | PRN
  Administered 2022-04-27: 25 ug via INTRAVENOUS

## 2022-04-27 MED ORDER — PHENYLEPHRINE 80 MCG/ML (10ML) SYRINGE FOR IV PUSH (FOR BLOOD PRESSURE SUPPORT)
PREFILLED_SYRINGE | INTRAVENOUS | Status: DC | PRN
  Administered 2022-04-27: 80 ug via INTRAVENOUS
  Administered 2022-04-27: 160 ug via INTRAVENOUS
  Administered 2022-04-27: 400 ug via INTRAVENOUS
  Administered 2022-04-27: 240 ug via INTRAVENOUS
  Administered 2022-04-27: 80 ug via INTRAVENOUS
  Administered 2022-04-27 (×2): 160 ug via INTRAVENOUS

## 2022-04-27 MED ORDER — DEXTROSE 50 % IV SOLN
INTRAVENOUS | Status: AC
Start: 2022-04-27 — End: 2022-04-27
  Administered 2022-04-27: 50 mL
  Filled 2022-04-27: qty 50

## 2022-04-27 MED ORDER — SODIUM CHLORIDE 0.9% FLUSH
10.0000 mL | Freq: Two times a day (BID) | INTRAVENOUS | Status: DC
  Administered 2022-04-27: 20 mL
  Administered 2022-04-28 (×2): 10 mL
  Administered 2022-04-29: 40 mL
  Administered 2022-04-29 – 2022-05-02 (×6): 10 mL
  Administered 2022-05-02: 20 mL
  Administered 2022-05-03 (×2): 10 mL
  Administered 2022-05-04: 30 mL

## 2022-04-27 MED ORDER — DEXAMETHASONE SODIUM PHOSPHATE 10 MG/ML IJ SOLN
INTRAMUSCULAR | Status: DC | PRN
  Administered 2022-04-27: 10 mg via INTRAVENOUS

## 2022-04-27 MED ORDER — ACETAMINOPHEN 325 MG PO TABS: 650.0000 mg | ORAL_TABLET | Freq: Four times a day (QID) | ORAL | Status: DC | PRN

## 2022-04-27 MED ORDER — VASOPRESSIN 20 UNIT/ML IV SOLN
INTRAVENOUS | Status: DC | PRN
  Administered 2022-04-27 (×2): 2 [IU] via INTRAVENOUS
  Administered 2022-04-27: 5 [IU] via INTRAVENOUS
  Administered 2022-04-27: 4 [IU] via INTRAVENOUS
  Administered 2022-04-27: 2 [IU] via INTRAVENOUS

## 2022-04-27 MED ORDER — ORAL CARE MOUTH RINSE: 15.0000 mL | Freq: Once | OROMUCOSAL | Status: AC

## 2022-04-27 MED ORDER — FENTANYL CITRATE (PF) 250 MCG/5ML IJ SOLN
INTRAMUSCULAR | Status: AC
  Filled 2022-04-27: qty 5

## 2022-04-27 SURGICAL SUPPLY — 49 items
ADH SKN CLS APL DERMABOND .7 (GAUZE/BANDAGES/DRESSINGS) ×1
ADH SKN CLS LQ APL DERMABOND (GAUZE/BANDAGES/DRESSINGS) ×1
BAG COUNTER SPONGE SURGICOUNT (BAG) ×2 IMPLANT
BAG SPNG CNTER NS LX DISP (BAG) ×1
BANDAGE ESMARK 6X9 LF (GAUZE/BANDAGES/DRESSINGS) IMPLANT
BNDG CMPR 9X6 STRL LF SNTH (GAUZE/BANDAGES/DRESSINGS) ×1
BNDG ESMARK 6X9 LF (GAUZE/BANDAGES/DRESSINGS) ×2
BNDG GAUZE ELAST 4 BULKY (GAUZE/BANDAGES/DRESSINGS) ×1 IMPLANT
CANISTER SUCT 3000ML PPV (MISCELLANEOUS) ×2 IMPLANT
CANNULA VESSEL 3MM 2 BLNT TIP (CANNULA) IMPLANT
CATH EMB 3FR 40CM (CATHETERS) ×1 IMPLANT
CLIP VESOCCLUDE MED 6/CT (CLIP) ×2 IMPLANT
CLIP VESOCCLUDE SM WIDE 6/CT (CLIP) ×2 IMPLANT
CUFF TOURN SGL QUICK 18 NS (TOURNIQUET CUFF) ×1 IMPLANT
DECANTER SPIKE VIAL GLASS SM (MISCELLANEOUS) IMPLANT
DERMABOND ADHESIVE PROPEN (GAUZE/BANDAGES/DRESSINGS) ×1
DERMABOND ADVANCED (GAUZE/BANDAGES/DRESSINGS) ×1
DERMABOND ADVANCED .7 DNX12 (GAUZE/BANDAGES/DRESSINGS) ×1 IMPLANT
DERMABOND ADVANCED .7 DNX6 (GAUZE/BANDAGES/DRESSINGS) IMPLANT
ELECT REM PT RETURN 9FT ADLT (ELECTROSURGICAL) ×2
ELECTRODE REM PT RTRN 9FT ADLT (ELECTROSURGICAL) ×1 IMPLANT
GAUZE SPONGE 4X4 12PLY STRL (GAUZE/BANDAGES/DRESSINGS) ×1 IMPLANT
GLOVE BIO SURGEON STRL SZ7.5 (GLOVE) ×2 IMPLANT
GLOVE BIOGEL PI IND STRL 8 (GLOVE) ×1 IMPLANT
GLOVE BIOGEL PI INDICATOR 8 (GLOVE) ×1
GLOVE SURG POLY ORTHO LF SZ7.5 (GLOVE) IMPLANT
GLOVE SURG UNDER LTX SZ8 (GLOVE) ×2 IMPLANT
GOWN STRL REUS W/ TWL LRG LVL3 (GOWN DISPOSABLE) ×3 IMPLANT
GOWN STRL REUS W/TWL LRG LVL3 (GOWN DISPOSABLE) ×6
GRAFT PROPATEN STD WALL 6X10 (Vascular Products) ×1 IMPLANT
KIT BASIN OR (CUSTOM PROCEDURE TRAY) ×2 IMPLANT
KIT TURNOVER KIT B (KITS) ×2 IMPLANT
NDL HYPO 25GX1X1/2 BEV (NEEDLE) ×1 IMPLANT
NEEDLE HYPO 25GX1X1/2 BEV (NEEDLE) ×2 IMPLANT
NS IRRIG 1000ML POUR BTL (IV SOLUTION) ×2 IMPLANT
PACK CV ACCESS (CUSTOM PROCEDURE TRAY) ×2 IMPLANT
PAD ARMBOARD 7.5X6 YLW CONV (MISCELLANEOUS) ×4 IMPLANT
PADDING CAST ABS 4INX4YD NS (CAST SUPPLIES) ×1
PADDING CAST ABS COTTON 4X4 ST (CAST SUPPLIES) IMPLANT
SPONGE SURGIFOAM ABS GEL 100 (HEMOSTASIS) IMPLANT
STOPCOCK 4 WAY LG BORE MALE ST (IV SETS) ×1 IMPLANT
SUT ETHILON 3 0 PS 1 (SUTURE) ×1 IMPLANT
SUT MNCRL AB 4-0 PS2 18 (SUTURE) IMPLANT
SUT PROLENE 6 0 BV (SUTURE) ×3 IMPLANT
SUT VIC AB 3-0 SH 27 (SUTURE) ×2
SUT VIC AB 3-0 SH 27X BRD (SUTURE) ×1 IMPLANT
TOWEL GREEN STERILE (TOWEL DISPOSABLE) ×2 IMPLANT
UNDERPAD 30X36 HEAVY ABSORB (UNDERPADS AND DIAPERS) ×2 IMPLANT
WATER STERILE IRR 1000ML POUR (IV SOLUTION) ×2 IMPLANT

## 2022-04-27 NOTE — Anesthesia Procedure Notes (Signed)
Arterial Line Insertion ?Start/End5/15/2023 1:45 PM, 04/27/2022 1:47 PM ?Performed by: Wilburn Cornelia, CRNA, CRNA ? Patient location: OR. ?Preanesthetic checklist: patient identified, IV checked, site marked, risks and benefits discussed, surgical consent, monitors and equipment checked, pre-op evaluation, timeout performed and anesthesia consent ?Left, radial was placed ?Catheter size: 20 G ?Hand hygiene performed  and maximum sterile barriers used  ? ?Attempts: 1 ?Procedure performed without using ultrasound guided technique. ?Following insertion, dressing applied. ?Patient tolerated the procedure well with no immediate complications. ? ? ?

## 2022-04-27 NOTE — Progress Notes (Signed)
eLink Physician-Brief Progress Note ?Patient Name: [name] ?DOB: 1951/04/09 ?MRN: [id number] ? ? ?Date of Service ? 04/27/2022  ?HPI/Events of Note ? 70/F with ESRD on HD, admitted to the ICU post fistula revision and excision of right degenerated graft.  She remains intubated and comfortable on propofol gtt.   ?eICU Interventions ? Add low dose fentanyl prn.  ?Titrate propofol to lowest dose for possible SBT in the morning.  ? ? ? ?Intervention Category ?Intermediate Interventions: Other: ? ?[name] ?04/27/2022, 8:09 PM ?

## 2022-04-27 NOTE — Consult Note (Signed)
? ?NAME:  [name], MRN:  [id number], DOB:  [birth year], LOS: 1 ?ADMISSION DATE:  04/26/2022, CONSULTATION DATE:  04/27/22 ?REFERRING MD:  Vascular, CHIEF COMPLAINT:  Hypotension  ? ?History of Present Illness:  ?[name] is a 71 y.o. F with PMH significant for ESRD on HD, CHF, CAD, HL, HTN who presented to the ED on 5/14 with purulent drainage from the AV fistula on the RUE.  She reported a "bug bite" on the site.   She was admitted to the hospitalist service and started on Cefepime and Vancomycin and taken to the OR for I&D on 5/15.  Peri-operatively pt's blood pressure dropped and she was started on Levophed and PCCM consulted  ? ?Pertinent  Medical History  ? has a past medical history of Acute ischemic stroke ([place]) (02/23/2017), Acute on chronic diastolic CHF (congestive heart failure) ([place]), Adenomatous polyp of colon (10/2010, 2006, 2015), Anemia in CKD (chronic kidney disease) (11/07/2012), Arthritis, CAD (coronary artery disease), CHF (congestive heart failure) ([place]), Constipation, Critical limb ischemia of left lower extremity with ulceration of foot ([place]) (03/17/2022), Depression with anxiety, Diverticula, colon, Diverticulitis of colon with perforation (08/06/2021), ESRD (end stage renal disease) ([place]) (11/07/2012), GERD (gastroesophageal reflux disease), GI bleed (2017), Headache, Hyperlipidemia, Hypertension, Neurologic gait dysfunction, Neuromuscular disorder ([place]), NSTEMI (non-ST elevated myocardial infarction) ([place]) (05/14/2020), Osteoporosis, Pneumonia, Pseudoaneurysm of surgical AV fistula ([place]), Pulmonary edema (12/2019), Stroke ([place]) (11/2015), TIA (transient ischemic attack) (02/21/2021), and Weight loss, unintentional. ? ? ?Significant Hospital Events: ?Including procedures, antibiotic start and stop dates in addition to other pertinent events   ?5/14 admitted with RUE AV graft infection, started on Vanc/Cefepime ?5/15 Taken to OR for graft revision, hypotensive and started on Levophed,  PCCM consulted, intubated ? ?Interim History / Subjective:  ? ? ?Objective   ?Blood pressure (!) 85/45, pulse (!) 51, temperature 98.1 ?F (36.7 ?C), temperature source Oral, resp. rate 16, SpO2 (!) 85 %. ?   ?   ? ?Intake/Output Summary (Last 24 hours) at 04/27/2022 1508 ?Last data filed at 04/27/2022 1442 ?Gross per 24 hour  ?Intake 550 ml  ?Output 200 ml  ?Net 350 ml  ? ?There were no vitals filed for this visit. ? ?General:  thin, elderly F, intubated and sedated ?HEENT: MM pink/moist, ETT in place, pupils equal, sclera anicteric  ?Neuro: examined on propofol and fentanyl, RASS -4 ?CV: s1s2 rrr, no m/r/g ?PULM:  Clear ?GI: soft, bsx4 active  ?Extremities: warm/dry, no edema  ?Skin: no rashes or lesions ? ? ?Resolved Hospital Problem list   ? ? ?Assessment & Plan:  ?Shock, septic vs vasoplegic in the setting of AV graft infection, staph bacteremia and anesthesia for graft excision and revision ?On Levophed now.  Wean down as tolerated ?Broaden antibiotics to Vanco, cefepime pending final cultures and sensitivities ?Recheck lactic acid ? ?Acute respiratory failure with hypoxia ?Remains on the ventilator postprocedure ?Check ABG, chest x-ray ?Continue vent support for now.  Assess for weaning in the morning ? ?End-stage renal disease ?Nephrology is on board.  IR has been consulted for temporary catheter tomorrow to resume dialysis. ? ?Anemia of chronic renal disease ?Monitor CBC ? ?Hypertension, diastolic CHF ?Hold outpatient medications ? ?Best Practice (right click and "Reselect all SmartList Selections" daily)  ? ?Diet/type: NPO ?DVT prophylaxis: prophylactic heparin  ?GI prophylaxis: PPI ?Lines: Central line ?Foley:  Yes, and it is still needed ?Code Status:  full code ?Last date of multidisciplinary goals of care discussion '[]'$  [name] was called and updated on telephone ? ?  Critical care time:   ? ?The patient is critically ill with multiple organ system failure and requires high complexity decision making  for assessment and support, frequent evaluation and titration of therapies, advanced monitoring, review of radiographic studies and interpretation of complex data.  ? ?Critical Care Time devoted to patient care services, exclusive of separately billable procedures, described in this note is 35 minutes.  ? ?Marshell Garfinkel MD ?[facility] ?See Amion for pager ? ?If no response to pager , please call [phone] until 7pm ?After 7:00 pm call Elink  [phone] ?04/27/2022, 6:27 PM  ? ? ? ?

## 2022-04-27 NOTE — Progress Notes (Signed)
New Admission Note:  ? ?Arrival Method: Stretcher ?Mental Orientation: alet x 4 ?Telemetry: yes ?Assessment: Completed ?Skin:see flowsheet ?IV: yes ?Pain: no ?Tubes: none ?Safety Measures: Safety Fall Prevention Plan has been discussed ?Admission: Completed ?[facility] Orientation: Patient has been orientated to the room, unit and staff.  ?Family:none at bedside ? ?Orders have been reviewed and implemented. Will continue to monitor the patient. Call light has been placed within reach and bed alarm has been activated.  ? ?Rockie Neighbours BSN, RN ?Phone number: [phone]  ?

## 2022-04-27 NOTE — Consult Note (Signed)
?Renal Service ?Consult Note ?[facility] ? ?[name] ?04/27/2022 ?Kathryn Blazing, MD ?Requesting Physician: Dr. Raelyn Mora ? ?Reason for Consult: ESRD pt w/ drainage from R arm AV graft ?HPI: The patient is a 71 y.o. year-old w/ hx of ESRD on HD, anemia, HTN, hx CVA, GIB and PAD came to ED for SOB last night 5/14.  Family noted her real issue was there was a "bug bite" on her R arm AV graft that had been draining blood and/or pus. In ED temp 101.6, BP's wnl, LA wnl. Creat 5.9k K 4.2.  Pt rec'd IV abx cefepime/ vanc and some LR IVF"s. Asked to see for esrd.  ? ?Pt seen in room. Pt lives w/ her mother, her mother helps her out. She takes [employer] bus MWF to HD. Walks w/ a walker. No home O2. Started HD in 2012.  ? ?ROS - denies CP, no joint pain, no HA, no blurry vision, no rash, no diarrhea, no nausea/ vomiting ? ? ?Past Medical History  ?Past Medical History:  ?Diagnosis Date  ? Acute ischemic stroke ([place]) 02/23/2017  ? Acute on chronic diastolic CHF (congestive heart failure) ([place])   ? Adenomatous polyp of colon 10/2010, 2006, 2015  ? Anemia in CKD (chronic kidney disease) 11/07/2012  ? s/p blood transfusion.   ? Arthritis   ? CAD (coronary artery disease)   ? "something like that"  ? CHF (congestive heart failure) ([place])   ? Constipation   ? Critical limb ischemia of left lower extremity with ulceration of foot ([place]) 03/17/2022  ? Depression with anxiety   ? Diverticula, colon   ? Diverticulitis of colon with perforation 08/06/2021  ? ESRD (end stage renal disease) ([place]) 11/07/2012  ? 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  Had some early rejection then stable function for years, then had slow decline of function and went back on hemodialysis in 2012.  Gets HD TTS schedule at [facility] on [facility] still using L forearm AVF.     ? GERD (gastroesophageal reflux disease)   ? GI bleed 2017  ? felt to be ischemic colitis, last colo 2015  ? Headache   ?  Hyperlipidemia   ? Hypertension   ? Neurologic gait dysfunction   ? Neuromuscular disorder ([place])   ? neuropathy hand and legs  ? NSTEMI (non-ST elevated myocardial infarction) ([place]) 05/14/2020  ? Osteoporosis   ? Pneumonia   ? Pseudoaneurysm of surgical AV fistula ([place])   ? left upper arm  ? Pulmonary edema 12/2019  ? Stroke [facility]) 11/2015  ? TIA  ? TIA (transient ischemic attack) 02/21/2021  ? Weight loss, unintentional   ? ?Past Surgical History  ?Past Surgical History:  ?Procedure Laterality Date  ? ABDOMINAL AORTOGRAM W/LOWER EXTREMITY N/A 03/26/2022  ? Procedure: ABDOMINAL AORTOGRAM W/LOWER EXTREMITY;  Surgeon: Marty Heck, MD;  Location: [facility];  Service: Cardiovascular;  Laterality: N/A;  Left  ? AV FISTULA PLACEMENT    ? for dialysis  ? AV FISTULA PLACEMENT Left 11/22/2015  ? Procedure: ARTERIOVENOUS (AV) FISTULA CREATION-LEFT BRACHIOCEPHALIC;  Surgeon: Serafina Mitchell, MD;  Location: [facility];  Service: Vascular;  Laterality: Left;  ? AV FISTULA PLACEMENT Right 03/15/2020  ? Procedure: INSERTION OF ARTERIOVENOUS (AV) GORE-TEX GRAFT ARM ( BRACHIAL AXILLARY );  Surgeon: Katha Cabal, MD;  Location: [facility] OR[facility];  Service: Vascular;  Laterality: Right;  ? BACK SURGERY    ? CERVICAL FUSION    ?  CHOLECYSTECTOMY  12/02/2012  ? Procedure: LAPAROSCOPIC CHOLECYSTECTOMY WITH INTRAOPERATIVE CHOLANGIOGRAM;  Surgeon: Edward Jolly, MD;  Location: [facility];  Service: General;  Laterality: N/A;  ? EYE SURGERY Bilateral   ? cataract surgery  ? EYE SURGERY Left 2019  ? laser  ? HEMATOMA EVACUATION Left 12/24/2016  ? Procedure: EVACUATION HEMATOMA LEFT UPPER ARM;  Surgeon: Waynetta Sandy, MD;  Location: [facility];  Service: Vascular;  Laterality: Left;  ? I & D EXTREMITY Left 12/31/2016  ? Procedure: IRRIGATION AND DEBRIDEMENT EXTREMITY;  Surgeon: Angelia Mould, MD;  Location: [facility];  Service: Vascular;  Laterality: Left;  ? INSERTION OF DIALYSIS CATHETER Right 12/24/2016  ? Procedure: INSERTION OF  DIALYSIS CATHETER;  Surgeon: Waynetta Sandy, MD;  Location: [facility];  Service: Vascular;  Laterality: Right;  ? INSERTION OF DIALYSIS CATHETER Right 02/04/2017  ? Procedure: INSERTION OF DIALYSIS CATHETER;  Surgeon: Waynetta Sandy, MD;  Location: [facility];  Service: Vascular;  Laterality: Right;  ? KIDNEY TRANSPLANT  1996  ? PERIPHERAL VASCULAR CATHETERIZATION Left 10/23/2016  ? Procedure: Fistulagram;  Surgeon: Elam Dutch, MD;  Location: [facility];  Service: Cardiovascular;  Laterality: Left;  ? PERIPHERAL VASCULAR INTERVENTION  03/26/2022  ? Procedure: PERIPHERAL VASCULAR INTERVENTION;  Surgeon: Marty Heck, MD;  Location: [facility];  Service: Cardiovascular;;  left sfa  ? PERIPHERAL VASCULAR THROMBECTOMY Right 04/16/2020  ? Procedure: PERIPHERAL VASCULAR THROMBECTOMY;  Surgeon: Katha Cabal, MD;  Location: [facility];  Service: Cardiovascular;  Laterality: Right;  ? RESECTION OF ARTERIOVENOUS FISTULA ANEURYSM Left 11/22/2015  ? Procedure: RESECTION OF LEFT RADIOCEPHALIC FISTULA ANEURYSM ;  Surgeon: Serafina Mitchell, MD;  Location: [facility];  Service: Vascular;  Laterality: Left;  ? REVISON OF ARTERIOVENOUS FISTULA Left 12/22/2016  ? Procedure: REVISON OF LEFT ARTERIOVENOUS FISTULA;  Surgeon: Waynetta Sandy, MD;  Location: [facility];  Service: Vascular;  Laterality: Left;  ? REVISON OF ARTERIOVENOUS FISTULA Left 02/04/2017  ? Procedure: REVISON OF LEFT UPPER ARM ARTERIOVENOUS FISTULA;  Surgeon: Waynetta Sandy, MD;  Location: [facility];  Service: Vascular;  Laterality: Left;  ? UPPER EXTREMITY ANGIOGRAPHY Bilateral 09/19/2019  ? Procedure: UPPER EXTREMITY ANGIOGRAPHY;  Surgeon: Katha Cabal, MD;  Location: [facility];  Service: Cardiovascular;  Laterality: Bilateral;  ? ?Family History  ?Family History  ?Problem Relation Age of Onset  ? Colon cancer Brother   ? Cancer Brother   ? Coronary artery disease Mother 22  ? Hyperlipidemia Mother    ? Hypertension Mother   ? Stroke Maternal Aunt   ? Esophageal cancer Neg Hx   ? Stomach cancer Neg Hx   ? Rectal cancer Neg Hx   ? ?Social History  reports that she quit smoking about 30 years ago. Her smoking use included cigarettes. She has never used smokeless tobacco. She reports that she does not drink alcohol and does not use drugs. ?Allergies  ?Allergies  ?Allergen Reactions  ? Sulfa Antibiotics Other (See Comments)  ?  Per patient, both parents allergic-so will not take  ? Adhesive [Tape] Itching  ? ?Home medications ?Prior to Admission medications   ?Medication Sig Start Date End Date Taking? Authorizing Provider  ?acetaminophen (TYLENOL) 500 MG tablet Take 1 tablet (500 mg total) by mouth every 6 (six) hours as needed. ?Patient taking differently: Take 500 mg by mouth every 6 (six) hours as needed for moderate pain or headache. 09/05/18   Frederica Kuster, PA-C  ?  acidophilus (RISAQUAD) CAPS capsule Take 2 capsules by mouth 3 (three) times daily. ?Patient not taking: Reported on 03/26/2022 01/24/22   Swayze, Ava, DO  ?albuterol (VENTOLIN HFA) 108 (90 Base) MCG/ACT inhaler Inhale 1-2 puffs into the lungs every 6 (six) hours as needed for wheezing or shortness of breath. 01/11/22   Mesner, Corene Cornea, MD  ?allopurinol (ZYLOPRIM) 300 MG tablet Take 150 mg by mouth daily. ?Patient not taking: Reported on 03/26/2022 11/03/21   [provider]  ?ALPRAZolam Duanne Moron) 0.25 MG tablet Take 1 tablet (0.25 mg total) by mouth daily as needed for anxiety. ?Patient not taking: Reported on 03/26/2022 08/18/21   Nita Sells, MD  ?aspirin EC 81 MG tablet Take 81 mg by mouth at bedtime.    [provider]  ?atorvastatin (LIPITOR) 40 MG tablet Take 1 tablet (40 mg total) by mouth daily. ?Patient not taking: Reported on 03/26/2022 01/13/22   Burnell Blanks, MD  ?atorvastatin (LIPITOR) 80 MG tablet Take 80 mg by mouth daily. ?Patient not taking: Reported on 03/26/2022 03/25/22   [provider]   ?AURYXIA 1 GM 210 MG(Fe) tablet Take 420 mg by mouth 3 (three) times daily. ?Patient not taking: Reported on 03/26/2022 03/11/22   [provider]  ?B Complex-C-Folic Acid (DIALYVITE 675) 0.8 MG WAFR Tak

## 2022-04-27 NOTE — Progress Notes (Signed)
PHARMACY - PHYSICIAN COMMUNICATION ?CRITICAL VALUE ALERT - BLOOD CULTURE IDENTIFICATION (BCID) ? ?[name] is an 72 y.o. female who presented to [facility] on 04/26/2022 with a chief complaint of infected AV Graft ? ?Assessment:  2/2 bottles with GPC, BCID detected S.aureus, no resistance ? ?Name of physician (or Provider) Contacted: [facility] auto-consult and Dr. Sloan Leiter ? ?Current antibiotics: Vanc/Cefepime ? ?Changes to prescribed antibiotics recommended:  ?Recommendations accepted by provider ?De-escalate to cefazolin monotherapy per ID recommendations ? ?Results for orders placed or performed during the hospital encounter of 04/26/22  ?Blood Culture ID Panel (Reflexed) (Collected: 04/26/2022  8:40 PM)  ?Result Value Ref Range  ? Enterococcus faecalis NOT DETECTED NOT DETECTED  ? Enterococcus Faecium NOT DETECTED NOT DETECTED  ? Listeria monocytogenes NOT DETECTED NOT DETECTED  ? Staphylococcus species DETECTED (A) NOT DETECTED  ? Staphylococcus aureus (BCID) DETECTED (A) NOT DETECTED  ? Staphylococcus epidermidis NOT DETECTED NOT DETECTED  ? Staphylococcus lugdunensis NOT DETECTED NOT DETECTED  ? Streptococcus species NOT DETECTED NOT DETECTED  ? Streptococcus agalactiae NOT DETECTED NOT DETECTED  ? Streptococcus pneumoniae NOT DETECTED NOT DETECTED  ? Streptococcus pyogenes NOT DETECTED NOT DETECTED  ? A.calcoaceticus-baumannii NOT DETECTED NOT DETECTED  ? Bacteroides fragilis NOT DETECTED NOT DETECTED  ? Enterobacterales NOT DETECTED NOT DETECTED  ? Enterobacter cloacae complex NOT DETECTED NOT DETECTED  ? Escherichia coli NOT DETECTED NOT DETECTED  ? Klebsiella aerogenes NOT DETECTED NOT DETECTED  ? Klebsiella oxytoca NOT DETECTED NOT DETECTED  ? Klebsiella pneumoniae NOT DETECTED NOT DETECTED  ? Proteus species NOT DETECTED NOT DETECTED  ? Salmonella species NOT DETECTED NOT DETECTED  ? Serratia marcescens NOT DETECTED NOT DETECTED  ? Haemophilus influenzae NOT DETECTED NOT DETECTED  ? Neisseria  meningitidis NOT DETECTED NOT DETECTED  ? Pseudomonas aeruginosa NOT DETECTED NOT DETECTED  ? Stenotrophomonas maltophilia NOT DETECTED NOT DETECTED  ? Candida albicans NOT DETECTED NOT DETECTED  ? Candida auris NOT DETECTED NOT DETECTED  ? Candida glabrata NOT DETECTED NOT DETECTED  ? Candida krusei NOT DETECTED NOT DETECTED  ? Candida parapsilosis NOT DETECTED NOT DETECTED  ? Candida tropicalis NOT DETECTED NOT DETECTED  ? Cryptococcus neoformans/gattii NOT DETECTED NOT DETECTED  ? Meth resistant mecA/C and MREJ NOT DETECTED NOT DETECTED  ? ?Lestine Box, PharmD ?[id number] Infectious Diseases Pharmacy Resident ?  ?Please check [email or web address] for unit-specific pharmacy phone numbers  ? ?

## 2022-04-27 NOTE — Progress Notes (Signed)
Patient transported from PACU to room 3G54 without complications.  ?

## 2022-04-27 NOTE — Progress Notes (Addendum)
?  Transition of Care (TOC) Screening Note ? ? ?Patient Details  ?Name: [name] ?Date of Birth: 07-26-1951 ? ? ?Transition of Care (TOC) CM/SW Contact:    ?Tom-Johnson, Renea Ee, RN ?Phone Number: ?04/27/2022, 1:37 PM ? ?Patient is admitted for Acute Resporatory failure with Hypoxia and found to have drainage from R arm AV graft from a bug bite per patient's family.  ?From home with mother. Has supporting family.  ?Uses [facility] transportation for appointments. Scheduled for dialysis on MWF.  ?PCP is [name], MD and uses [address] [facility] [address]. ? ?Transition of Care Department [facility]) has reviewed patient and no TOC needs or recommendations have been identified at this time. TOC will continue to monitor patient advancement through interdisciplinary progression rounds. If new patient transition needs arise, please place a TOC consult. ?  ?

## 2022-04-27 NOTE — Consult Note (Addendum)
[facility] [place] ?CONSULT NOTE ?VASCULAR SURGERY ASSESSMENT & PLAN:  ? ?WOUND OVERLYING RIGHT AVG: This patient has a wound over her left AVG which is oozing.  I have recommended exploration in the operating room given the risk for bleeding.  I have explained to the patient and also to her son who is power of attorney that there is a small chance that she would require removal of the graft the graft is infected.  If at all possible we will try to salvage the graft. ? ?Gae Gallop, MD ?11:56 AM ? ? ?MRN : [id number] ? ?Reason for Consult: right UE bleeding with possible infection ?Referring Physician: Nephrology ? ?History of Present Illness: 71 y/o female with long standing history of ESRD and multiple access surgery.  She was brought to the ED with chief complaint of shortness of breath.  She states she thinks she was bitten by something over the distal access site and has venous appearing bleeding with ulcer.  Her family states she had pus in the wound.  Her Tm at admission was 101.3 with WBC of 11.3.   ? She had the right UE AV graft placed by Dr. Delana Meyer at [facility] 03/15/20.  Her last HD was Friday and they used the graft above the affected area at that time.  We have been ask to exam and treat the area of concern.  She has been started on IV antibiotics.  Blood cultures have been ordered.  Chest x ray was negative for pneumonia. ?  ? ? ?Current Facility-Administered Medications  ?Medication Dose Route Frequency Provider Last Rate Last Admin  ? ceFEPIme (MAXIPIME) 1 g in sodium chloride 0.9 % 100 mL IVPB  1 g Intravenous Q24H Mancheril, Darnell Level, RPH      ? clotrimazole (GYNE-LOTRIMIN 3) 2 % vaginal cream 1 Applicatorful  1 Applicatorful Vaginal QHS Kristopher Oppenheim, DO   1 Applicatorful at 35/46/56 0149  ? lactated ringers infusion   Intravenous Continuous Drenda Freeze, MD 150 mL/hr at 04/27/22 0705 New Bag at 04/27/22 0705  ? [START ON 04/28/2022] vancomycin (VANCOREADY) IVPB 500 mg/100  mL  500 mg Intravenous Q T,Th,Sa-HD Rudisill, Jodean Lima, RPH      ? ? ?Pt meds include: ?Statin :Yes ?Betablocker: Yes ?ASA: Yes ?Other anticoagulants/antiplatelets: Plavix ? ?Past Medical History:  ?Diagnosis Date  ? Acute ischemic stroke ([place]) 02/23/2017  ? Acute on chronic diastolic CHF (congestive heart failure) ([place])   ? Adenomatous polyp of colon 10/2010, 2006, 2015  ? Anemia in CKD (chronic kidney disease) 11/07/2012  ? s/p blood transfusion.   ? Arthritis   ? CAD (coronary artery disease)   ? "something like that"  ? CHF (congestive heart failure) ([place])   ? Constipation   ? Critical limb ischemia of left lower extremity with ulceration of foot ([place]) 03/17/2022  ? Depression with anxiety   ? Diverticula, colon   ? Diverticulitis of colon with perforation 08/06/2021  ? ESRD (end stage renal disease) ([place]) 11/07/2012  ? 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  Had some early rejection then stable function for years, then had slow decline of function and went back on hemodialysis in 2012.  Gets HD TTS schedule at [facility] on [facility] still using L forearm AVF.     ? GERD (gastroesophageal reflux disease)   ? GI bleed 2017  ? felt to be ischemic colitis, last colo 2015  ? Headache   ?  Hyperlipidemia   ? Hypertension   ? Neurologic gait dysfunction   ? Neuromuscular disorder ([place])   ? neuropathy hand and legs  ? NSTEMI (non-ST elevated myocardial infarction) ([place]) 05/14/2020  ? Osteoporosis   ? Pneumonia   ? Pseudoaneurysm of surgical AV fistula ([place])   ? left upper arm  ? Pulmonary edema 12/2019  ? Stroke [facility]) 11/2015  ? TIA  ? TIA (transient ischemic attack) 02/21/2021  ? Weight loss, unintentional   ? ? ?Past Surgical History:  ?Procedure Laterality Date  ? ABDOMINAL AORTOGRAM W/LOWER EXTREMITY N/A 03/26/2022  ? Procedure: ABDOMINAL AORTOGRAM W/LOWER EXTREMITY;  Surgeon: Marty Heck, MD;  Location: [place] [facility];  Service: Cardiovascular;  Laterality: N/A;  Left  ?  AV FISTULA PLACEMENT    ? for dialysis  ? AV FISTULA PLACEMENT Left 11/22/2015  ? Procedure: ARTERIOVENOUS (AV) FISTULA CREATION-LEFT BRACHIOCEPHALIC;  Surgeon: Serafina Mitchell, MD;  Location: [place];  Service: Vascular;  Laterality: Left;  ? AV FISTULA PLACEMENT Right 03/15/2020  ? Procedure: INSERTION OF ARTERIOVENOUS (AV) GORE-TEX GRAFT ARM ( BRACHIAL AXILLARY );  Surgeon: Katha Cabal, MD;  Location: [facility];  Service: Vascular;  Laterality: Right;  ? BACK SURGERY    ? CERVICAL FUSION    ? CHOLECYSTECTOMY  12/02/2012  ? Procedure: LAPAROSCOPIC CHOLECYSTECTOMY WITH INTRAOPERATIVE CHOLANGIOGRAM;  Surgeon: Edward Jolly, MD;  Location: [facility];  Service: General;  Laterality: N/A;  ? EYE SURGERY Bilateral   ? cataract surgery  ? EYE SURGERY Left 2019  ? laser  ? HEMATOMA EVACUATION Left 12/24/2016  ? Procedure: EVACUATION HEMATOMA LEFT UPPER ARM;  Surgeon: Waynetta Sandy, MD;  Location: [place];  Service: Vascular;  Laterality: Left;  ? I & D EXTREMITY Left 12/31/2016  ? Procedure: IRRIGATION AND DEBRIDEMENT EXTREMITY;  Surgeon: Angelia Mould, MD;  Location: [place];  Service: Vascular;  Laterality: Left;  ? INSERTION OF DIALYSIS CATHETER Right 12/24/2016  ? Procedure: INSERTION OF DIALYSIS CATHETER;  Surgeon: Waynetta Sandy, MD;  Location: [place];  Service: Vascular;  Laterality: Right;  ? INSERTION OF DIALYSIS CATHETER Right 02/04/2017  ? Procedure: INSERTION OF DIALYSIS CATHETER;  Surgeon: Waynetta Sandy, MD;  Location: [place];  Service: Vascular;  Laterality: Right;  ? KIDNEY TRANSPLANT  1996  ? PERIPHERAL VASCULAR CATHETERIZATION Left 10/23/2016  ? Procedure: Fistulagram;  Surgeon: Elam Dutch, MD;  Location: [place] [facility];  Service: Cardiovascular;  Laterality: Left;  ? PERIPHERAL VASCULAR INTERVENTION  03/26/2022  ? Procedure: PERIPHERAL VASCULAR INTERVENTION;  Surgeon: Marty Heck, MD;  Location: [place] [facility];  Service: Cardiovascular;;  left sfa  ?  PERIPHERAL VASCULAR THROMBECTOMY Right 04/16/2020  ? Procedure: PERIPHERAL VASCULAR THROMBECTOMY;  Surgeon: Katha Cabal, MD;  Location: [place] [facility];  Service: Cardiovascular;  Laterality: Right;  ? RESECTION OF ARTERIOVENOUS FISTULA ANEURYSM Left 11/22/2015  ? Procedure: RESECTION OF LEFT RADIOCEPHALIC FISTULA ANEURYSM ;  Surgeon: Serafina Mitchell, MD;  Location: [place];  Service: Vascular;  Laterality: Left;  ? REVISON OF ARTERIOVENOUS FISTULA Left 12/22/2016  ? Procedure: REVISON OF LEFT ARTERIOVENOUS FISTULA;  Surgeon: Waynetta Sandy, MD;  Location: [place];  Service: Vascular;  Laterality: Left;  ? REVISON OF ARTERIOVENOUS FISTULA Left 02/04/2017  ? Procedure: REVISON OF LEFT UPPER ARM ARTERIOVENOUS FISTULA;  Surgeon: Waynetta Sandy, MD;  Location: [place];  Service: Vascular;  Laterality: Left;  ? UPPER EXTREMITY ANGIOGRAPHY Bilateral 09/19/2019  ? Procedure: UPPER EXTREMITY  ANGIOGRAPHY;  Surgeon: Katha Cabal, MD;  Location: [place] [facility];  Service: Cardiovascular;  Laterality: Bilateral;  ? ? ?Social History ?Social History  ? ?Tobacco Use  ? Smoking status: Former  ?  Types: Cigarettes  ?  Quit date: 12/31/1991  ?  Years since quitting: 30.3  ? Smokeless tobacco: Never  ?Vaping Use  ? Vaping Use: Never used  ?Substance Use Topics  ? Alcohol use: No  ?  Alcohol/week: 0.0 standard drinks  ? Drug use: No  ? ? ?Family History ?Family History  ?Problem Relation Age of Onset  ? Colon cancer Brother   ? Cancer Brother   ? Coronary artery disease Mother 85  ? Hyperlipidemia Mother   ? Hypertension Mother   ? Stroke Maternal Aunt   ? Esophageal cancer Neg Hx   ? Stomach cancer Neg Hx   ? Rectal cancer Neg Hx   ? ? ?Allergies  ?Allergen Reactions  ? Sulfa Antibiotics Other (See Comments)  ?  Per patient, both parents allergic-so will not take  ? Adhesive [Tape] Itching  ? ? ? ?REVIEW OF SYSTEMS ? ?General: '[ ]'$  Weight loss, '[ ]'$  Fever, '[ ]'$  chills ?Neurologic: '[ ]'$  Dizziness, '[ ]'$   Blackouts, '[ ]'$  Seizure ?'[ ]'$  Stroke, '[ ]'$  "Mini stroke", '[ ]'$  Slurred speech, '[ ]'$  Temporary blindness; '[ ]'$  weakness in arms or legs, '[ ]'$  Hoarseness '[ ]'$  Dysphagia ?Cardiac: '[ ]'$  Chest pain/pressure, '[ ]'$  [place]

## 2022-04-27 NOTE — Progress Notes (Incomplete)
Attending note: ?I have seen and examined the patient. History, labs and imaging reviewed. ? ?71 year old with end-stage renal disease on hemodialysis, CHF, coronary artery disease, hypertension, hyperlipidemia presenting with infected AV fistula with purulent drainage.  Taken to the OR for I&D on 5/15.  She developed shock and had a central line placed, started on pressors.  PCCM called for management ? ?Blood pressure (!) 126/39, pulse (!) 103, temperature 97.7 ?F (36.5 ?C), resp. rate (!) 26, height '5\' 2"'$  (1.575 m), SpO2 100 %. ?Gen:      No acute distress ?HEENT:  EOMI, sclera anicteric ?Neck:     No masses; no thyromegaly ?Lungs:    Clear to auscultation bilaterally; normal respiratory effort*** ?CV:         Regular rate and rhythm; no murmurs ?Abd:      + bowel sounds; soft, non-tender; no palpable masses, no distension ?Ext:    No edema; adequate peripheral perfusion ?Skin:      Warm and dry; no rash ?Neuro: alert and oriented x 3 ?Psych: normal mood and affect  ? ?Labs/Imaging personally reviewed, significant for ?BUN/creatinine 44/6.36, hepatitis panel is normal ?WBC 9.8, hemoglobin 11.9, platelets 167 ?Chest x-ray with cardiomegaly, mild vascular congestion ? ?Assessment/plan: ?Septic shock secondary to infected AV fistula ?S/p I&D ? ? ? ?The patient is critically ill with multiple organ systems failure and requires high complexity decision making for assessment and support, frequent evaluation and titration of therapies, application of advanced monitoring technologies and extensive interpretation of multiple databases.  ?Critical care time - 35 mins. This represents my time independent of the NPs time taking care of the pt. ? ?Marshell Garfinkel MD ?[facility] ?04/27/2022, 5:27 PM  ?

## 2022-04-27 NOTE — Consult Note (Signed)
?   ? ? ? ? ?[facility] Infect[facility]   ? ?Date of Admission:  04/26/2022    ? ?Reason for Consult: mssa bacteremia     ?Referring Provider: Lenice Llamas, autochamp ? ? ?Lines:  ?5/15-c right ij and left ij central line ?5/15-c right ue pfte graft ? ?rue UE avg removed 5/15 ? ?Abx: ?5/15-c cefazolin      ? ? ?Assessment: ?71 yo female esrd with iHD via right UE avf (placed 03/2020 at [place]) admitted with dyspnea in setting sepsis and cellulitis/abscess over the avg and mssa bacteremia ? ?She is s/p I&D of the focal infected area with excision of the graft which appeared "degenerative" without gross purulence. A new PTFE graft was placed ? ?I would treat this as infected graft. And with new ptfe graft placement, would treat for 6 weeks iv and then suppressive abx ? ?Will need repeat bcx and tte as well ? ?Dyspnea but nothing in terms of pna. Suspect sepsis induced. Will monitor for PE ? ?Noted stable ascending aortic aneurysm. She is on betablocker outpatient ? ?She is currently intubated post-op. Will review sites of metastasis focus of infection, although her sister who is closed to patient reports patient without complaint outside of the right ue ? ?Plan: ?Change abx to cefazolin ?Tte ?Repeat bcx ?The right and left IJ are placed along with the pfte graft within 24hours bacteremia, if persistent bacteremia will have to consider involvement if negative other focus of infection ?Discussed with primary team ?  ? ?I spent 80 minute reviewing data/chart, and coordinating care and >50% direct face to face time providing counseling/discussing diagnostics/treatment plan with patient ? ? ? ?------------------------------------------------ ?Principal Problem: ?  Acute respiratory failure with hypoxia ([place]) ?Active Problems: ?  End-stage renal disease on hemodialysis ([place]) ?  HTN (hypertension) ?  Chronic diastolic CHF (congestive heart failure) ([place]) ?  Anemia of chronic renal failure ?  Dialysis AV fistula  infection, initial encounter ([place]) ? ? ? ?HPI: [name] is a 71 y.o. female with esrd, HFpEF, hx cva, hx gib,ascending aortic aneurysm, pvd s/p LLE stent/angioplasty, admitted 5/15 for acute sob found to have mssa bacteremia and relative hypoxemic respiratory distress and sepsis physiology/fever ? ?Patient brought in by ems.  ? ?She reported "bug bite" at the avf site with bleeding and purulence output of several days. She had dialysis 2 days prior to admission away from the site of the purulence ? ?In the ed she was febrile to 101 with mild leukocytosis ?Ct chest revealed stable ascending aortic aneurysm enlarged heart silhouette but no pericardial effusion, no parenchymal lung disease. ?Bcx returns with mssa ?She was started on bsAbx ? ?Vascular surgery was consulted and she was taken to or for I&D. I reviewed operative note. No gross purulence found. Old avf degenerative and excised. A new pfte graft was placed ? ?She denies focal joint/back pain, headache, visual changes, other skin rash ? ?She is currently intubated post-op ? ?I talked to her sister who is bedside. Reports patient hasn't been complaining of other issue, just pain/redness/swelling/pus off the right ue for less than a week. Friday (3 days prior to admission) noted in dialysis center, and the HD center was accessing proximal to the affected area. ? ?Patient had right LE stent/angioplasty 03/2022 outside of [facility]. No complaint in that area. No other prosthetics. No pacemaker or cardiac device ? ? ?Family History  ?Problem Relation Age of Onset  ? Colon cancer Brother   ?  Cancer Brother   ? Coronary artery disease Mother 82  ? Hyperlipidemia Mother   ? Hypertension Mother   ? Stroke Maternal Aunt   ? Esophageal cancer Neg Hx   ? Stomach cancer Neg Hx   ? Rectal cancer Neg Hx   ? ? ?Social History  ? ?Tobacco Use  ? Smoking status: Former  ?  Types: Cigarettes  ?  Quit date: 12/31/1991  ?  Years since quitting: 30.3  ? Smokeless tobacco:  Never  ?Vaping Use  ? Vaping Use: Never used  ?Substance Use Topics  ? Alcohol use: No  ?  Alcohol/week: 0.0 standard drinks  ? Drug use: No  ? ? ?Allergies  ?Allergen Reactions  ? Sulfa Antibiotics Other (See Comments)  ?  Per patient, both parents allergic-so will not take  ? Adhesive [Tape] Itching  ? ? ?Review of Systems: ?ROS ?All Other ROS was negative, except mentioned above ? ? ?Past Medical History:  ?Diagnosis Date  ? Acute ischemic stroke ([place]) 02/23/2017  ? Acute on chronic diastolic CHF (congestive heart failure) ([place])   ? Adenomatous polyp of colon 10/2010, 2006, 2015  ? Anemia in CKD (chronic kidney disease) 11/07/2012  ? s/p blood transfusion.   ? Arthritis   ? CAD (coronary artery disease)   ? "something like that"  ? CHF (congestive heart failure) ([place])   ? Constipation   ? Critical limb ischemia of left lower extremity with ulceration of foot ([place]) 03/17/2022  ? Depression with anxiety   ? Diverticula, colon   ? Diverticulitis of colon with perforation 08/06/2021  ? ESRD (end stage renal disease) ([place]) 11/07/2012  ? 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  Had some early rejection then stable function for years, then had slow decline of function and went back on hemodialysis in 2012.  Gets HD TTS schedule at [facility] still using L forearm AVF.     ? GERD (gastroesophageal reflux disease)   ? GI bleed 2017  ? felt to be ischemic colitis, last colo 2015  ? Headache   ? Hyperlipidemia   ? Hypertension   ? Neurologic gait dysfunction   ? Neuromuscular disorder ([place])   ? neuropathy hand and legs  ? NSTEMI (non-ST elevated myocardial infarction) ([place]) 05/14/2020  ? Osteoporosis   ? Pneumonia   ? Pseudoaneurysm of surgical AV fistula ([place])   ? left upper arm  ? Pulmonary edema 12/2019  ? Stroke [facility]) 11/2015  ? TIA  ? TIA (transient ischemic attack) 02/21/2021  ? Weight loss, unintentional   ? ? ? ? ? ?Scheduled Meds: ? [START ON 04/28/2022] Chlorhexidine  Gluconate Cloth  6 each Topical Q0600  ? clopidogrel  75 mg Oral Daily  ? clotrimazole  1 Applicatorful Vaginal QHS  ? docusate  100 mg Per Tube BID  ? heparin  5,000 Units Subcutaneous Q8H  ? mupirocin ointment  1 application. Nasal BID  ? polyethylene glycol  17 g Per Tube Daily  ? sodium zirconium cyclosilicate  10 g Oral TID  ? ?Continuous Infusions: ?  ceFAZolin (ANCEF) IV    ? lactated ringers    ? norepinephrine (LEVOPHED) Adult infusion 9 mcg/min (04/27/22 1741)  ? ?PRN Meds:.acetaminophen **OR** acetaminophen, albuterol ? ? ?OBJECTIVE: ?Blood pressure (!) 123/32, pulse 93, temperature 97.7 ?F (36.5 ?C), resp. rate (!) 27, height '5\' 2"'$  (1.575 m), SpO2 100 %. ? ?Physical Exam ?General/constitutional: intubated, sedated ?HEENT: Normocephalic, PER, Conj  Clear, EOMI, Oropharynx clear ?Neck supple ?CV: rrr no mrg ?Lungs: clear on vent ?Abd: Soft, Nontender ?Ext: no edema ?Skin: dressing over rue graft site c/d/I; reviewed picture from admission of the carbuncle/cellulitic area ?Neuro: nonfocal ?MSK: no peripheral joint swelling/tenderness/warmth ? ? ?Right and left ij present -- no erythema/bleeding ? ? ? ?Lab Results ?Lab Results  ?Component Value Date  ? WBC 9.0 04/27/2022  ? HGB 11.2 (L) 04/27/2022  ? HCT 33.0 (L) 04/27/2022  ? MCV 97.3 04/27/2022  ? PLT 167 04/27/2022  ?  ?Lab Results  ?Component Value Date  ? CREATININE 6.36 (H) 04/27/2022  ? BUN 44 (H) 04/27/2022  ? NA 136 04/27/2022  ? K 3.9 04/27/2022  ? CL 94 (L) 04/27/2022  ? CO2 24 04/27/2022  ?  ?Lab Results  ?Component Value Date  ? ALT 20 04/27/2022  ? AST 40 04/27/2022  ? ALKPHOS 101 04/27/2022  ? BILITOT 1.1 04/27/2022  ?  ? ? ?Microbiology: ?Recent Results (from the past 240 hour(s))  ?Blood Culture (routine x 2)     Status: None (Preliminary result)  ? Collection Time: 04/26/22  8:40 PM  ? Specimen: BLOOD  ?Result Value Ref Range Status  ? Specimen Description BLOOD  Final  ? Special Requests   Final  ?  RIGHT EJ BOTTLES DRAWN AEROBIC AND  ANAEROBIC Blood Culture adequate volume  ? Culture  Setup Time   Final  ?  GRAM POSITIVE COCCI ?IN BOTH AEROBIC AND ANAEROBIC BOTTLES ?CRITICAL RESULT CALLED TO, READ BACK BY AND VERIFIED WITH: PHARMD ALEX [phone]

## 2022-04-27 NOTE — Anesthesia Postprocedure Evaluation (Addendum)
Anesthesia Post Note ? ?Patient: [name] ? ?Procedure(s) Performed: REVISION OF RIGHT ARTERIOVENOUS GRAFT AND EXCISION OF RIGHT DEGENERATED GRAFT (AVG) (Right: Arm Upper) ? ?  ? ?Patient location during evaluation: PACU ?Anesthesia Type: General ?Level of consciousness: sedated ?Pain management: pain level controlled ?Vital Signs Assessment: post-procedure vital signs reviewed and stable ?Respiratory status: patient remains intubated per anesthesia plan ?Cardiovascular status: stable and tachycardic ?Postop Assessment: no apparent nausea or vomiting ?Anesthetic complications: yes ?Comments: Patient remained intubated post-operatively for respiratory distress, continued hypotension, and acidosis.  MDA gave report to ICU team assuming care of patient including patient history and events in the OR.  CVL placed in PACU for access and continued need for vasoactive infusions. ? ? ?No notable events documented. ? ?Last Vitals:  ?Vitals:  ? 04/27/22 1700 04/27/22 1710  ?BP:  (!) 126/39  ?Pulse:  (!) 103  ?Resp:  (!) 26  ?Temp:    ?SpO2: 100% 100%  ?  ?Last Pain:  ?Vitals:  ? 04/27/22 0912  ?TempSrc: Oral  ?PainSc:   ? ? ?  ?  ?  ?  ?  ?  ? ?[place] ? ? ? ? ?

## 2022-04-27 NOTE — Transfer of Care (Signed)
Immediate Anesthesia Transfer of Care Note ? ?Patient: [name] ? ?Procedure(s) Performed: REVISION OF RIGHT ARTERIOVENOUS GRAFT AND EXCISION OF RIGHT DEGENERATED GRAFT (AVG) (Right: Arm Upper) ? ?Patient Location: PACU ? ?Anesthesia Type:General ? ?Level of Consciousness: sedated, unresponsive and Patient remains intubated per anesthesia plan ? ?Airway & Oxygen Therapy: Patient remains intubated per anesthesia plan and Patient placed on Ventilator (see vital sign flow sheet for setting) ? ?Post-op Assessment: Report given to RN and Post -op Vital signs reviewed and stable ? ?Post vital signs: Reviewed and stable ? ?Last Vitals:  ?Vitals Value Taken Time  ?BP 68/36 04/27/22 1557  ?Temp 36.5 ?C 04/27/22 1555  ?Pulse 119 04/27/22 1555  ?Resp 26 04/27/22 1609  ?SpO2 100 % 04/27/22 1555  ?Vitals shown include unvalidated device data. ? ?Last Pain:  ?Vitals:  ? 04/27/22 0912  ?TempSrc: Oral  ?PainSc:   ?   ? ?  ?Arterial line pressure SBP 120s, LE BP cuff not reading well.  ?Complications: No notable events documented. ?

## 2022-04-27 NOTE — Plan of Care (Signed)
?  Problem: Clinical Measurements: ?Goal: Ability to maintain clinical measurements within normal limits will improve ?Outcome: Progressing ?Goal: Will remain free from infection ?Outcome: Progressing ?Goal: Diagnostic test results will improve ?Outcome: Progressing ?Goal: Respiratory complications will improve ?Outcome: Progressing ?Goal: Cardiovascular complication will be avoided ?Outcome: Progressing ?  ?Problem: Coping: ?Goal: Level of anxiety will decrease ?Outcome: Progressing ?  ?Problem: Elimination: ?Goal: Will not experience complications related to urinary retention ?Outcome: Not Applicable ?  ?Problem: Pain Managment: ?Goal: General experience of comfort will improve ?Outcome: Progressing ?  ?Problem: Safety: ?Goal: Ability to remain free from injury will improve ?Outcome: Progressing ?  ?Problem: Skin Integrity: ?Goal: Risk for impaired skin integrity will decrease ?Outcome: Progressing ?  ?Problem: Activity: ?Goal: Ability to tolerate increased activity will improve ?Outcome: Progressing ?  ?Problem: Respiratory: ?Goal: Ability to maintain a clear airway and adequate ventilation will improve ?Outcome: Progressing ?  ?Problem: Role Relationship: ?Goal: Method of communication will improve ?Outcome: Progressing ?  ?

## 2022-04-27 NOTE — Progress Notes (Signed)
?PROGRESS NOTE ? ? ? ?[name]  OJJ:[id number] DOB: 06-Dec-1951 DOA: 04/26/2022 ?PCP: [name], MD  ? ? ?Brief Narrative:  ?71 year old with history of ESRD on hemodialysis Monday Wednesday Friday, chronic diastolic heart failure, chronic anemia, severe peripheral vascular disease status post multiple stenting and interventions brought to ER with lethargy.  Reported pus draining from right AV fistula.  Febrile in the emergency room.  Lactic acid normal.  Potassium 4.2.  Blood cultures collected and antibiotics were started. ? ? ?Assessment & Plan: ?  ?Dialysis AV fistula infection/malfunctioning: ?Suspect infected AV fistula.  Discussed with vascular and nephrology.  To operating room today for exploration, removal or repair.  Continue antibiotics pending final cultures.  May need temporary HD catheter in the meantime. ?Nephrology following for dialysis need. ? ?Acute respiratory failure with hypoxemia: Currently on room air.  Ruled out. ? ?Anemia of chronic disease: Stable. ? ?Hypertension: Chronic.  On midodrine. ? ? ?DVT prophylaxis: heparin injection 5,000 Units Start: 04/27/22 1400 ?SCDs Start: 04/27/22 1037 ? ? ?Code Status: Full code ?Family Communication: None ?Disposition Plan: Status is: Inpatient ?Remains inpatient appropriate because: Inpatient procedures planned ?  ? ? ?Consultants:  ?Vascular surgery ?Nephrology ? ?Procedures:  ?None ? ?Antimicrobials:  ?Vancomycin and cefepime 5/14--- ? ? ?Subjective: ?Patient was seen and examined.  She tells me she is lethargic.  Denies any other complaints.  Hungry.  Discussed with nephrology at the bedside and vascular surgery inpatient had decided to go to surgery. ?Patient was wondering whether we can remove her ugly looking nonfunctional access from her left arm. ? ?Objective: ?Vitals:  ? 04/27/22 0400 04/27/22 0415 04/27/22 0459 04/27/22 0912  ?BP: 134/60 (!) 111/48 (!) 89/51 (!) 85/45  ?Pulse: 83 81 76 (!) 51  ?Resp: (!) '22 16 17 16  '$ ?Temp:  98 ?F  (36.7 ?C) 98.3 ?F (36.8 ?C) 98.1 ?F (36.7 ?C)  ?TempSrc:  Oral  Oral  ?SpO2: 97% 97% 97% (!) 85%  ? ? ?Intake/Output Summary (Last 24 hours) at 04/27/2022 1336 ?Last data filed at 04/27/2022 0900 ?Gross per 24 hour  ?Intake 0 ml  ?Output --  ?Net 0 ml  ? ?There were no vitals filed for this visit. ? ?Examination: ? ?General exam: Chronically sick looking.  Frail and debilitated. ?She is alert and awake as well as oriented but pretty sleepy and looks tired. ?Respiratory system: Clear to auscultation. Respiratory effort normal.  No added sounds. ?Cardiovascular system: S1 & S2 heard, RRR.  No edema. ?Gastrointestinal system: Abdomen is nondistended, soft and nontender. No organomegaly or masses felt. Normal bowel sounds heard. ?Left arm AV graft, nonfunctional. ?Right arm AV fistula with dark drainage, pictures in the chart. ? ? ? ?Data Reviewed: I have personally reviewed following labs and imaging studies ? ?CBC: ?Recent Labs  ?Lab 04/26/22 ?2040 04/26/22 ?2054 04/27/22 ?1105  ?WBC 11.3*  --  9.0  ?NEUTROABS 9.8*  --  6.7  ?HGB 10.5* 11.2* 11.9*  ?HCT 31.1* 33.0* 36.6  ?MCV 96.9  --  97.3  ?PLT 184  --  167  ? ?Basic Metabolic Panel: ?Recent Labs  ?Lab 04/26/22 ?2040 04/26/22 ?2054 04/27/22 ?1105  ?NA 134* 132* 135  ?K 4.2 4.2 5.0  ?CL 95* 95* 94*  ?CO2 26  --  24  ?GLUCOSE 125* 125* 80  ?BUN 39* 35* 44*  ?CREATININE 5.92* 6.30* 6.36*  ?CALCIUM 8.9  --  8.9  ?MG  --   --  2.1  ? ?GFR: ?CrCl cannot be calculated (Unknown  ideal weight.). ?Liver Function Tests: ?Recent Labs  ?Lab 04/26/22 ?2040 04/27/22 ?1105  ?AST 27 40  ?ALT 16 20  ?ALKPHOS 121 101  ?BILITOT 1.3* 1.1  ?PROT 6.8 6.9  ?ALBUMIN 2.9* 2.9*  ? ?No results for input(s): LIPASE, AMYLASE in the last 168 hours. ?No results for input(s): AMMONIA in the last 168 hours. ?Coagulation Profile: ?No results for input(s): INR, PROTIME in the last 168 hours. ?Cardiac Enzymes: ?No results for input(s): CKTOTAL, CKMB, CKMBINDEX, TROPONINI in the last 168 hours. ?BNP (last  3 results) ?No results for input(s): PROBNP in the last 8760 hours. ?HbA1C: ?No results for input(s): HGBA1C in the last 72 hours. ?CBG: ?Recent Labs  ?Lab 04/26/22 ?2334 04/27/22 ?0759 04/27/22 ?1025 04/27/22 ?1210 04/27/22 ?1228  ?GLUCAP 114* 73 72 69* 77  ? ?Lipid Profile: ?No results for input(s): CHOL, HDL, LDLCALC, TRIG, CHOLHDL, LDLDIRECT in the last 72 hours. ?Thyroid Function Tests: ?No results for input(s): TSH, T4TOTAL, FREET4, T3FREE, THYROIDAB in the last 72 hours. ?Anemia Panel: ?No results for input(s): VITAMINB12, FOLATE, FERRITIN, TIBC, IRON, RETICCTPCT in the last 72 hours. ?Sepsis Labs: ?Recent Labs  ?Lab 04/26/22 ?2040 04/26/22 ?2330  ?LATICACIDVEN 1.5 1.3  ? ? ?Recent Results (from the past 240 hour(s))  ?Blood Culture (routine x 2)     Status: None (Preliminary result)  ? Collection Time: 04/26/22  8:40 PM  ? Specimen: BLOOD  ?Result Value Ref Range Status  ? Specimen Description BLOOD  Final  ? Special Requests   Final  ?  RIGHT EJ BOTTLES DRAWN AEROBIC AND ANAEROBIC Blood Culture adequate volume  ? Culture  Setup Time   Final  ?  GRAM POSITIVE COCCI ?IN BOTH AEROBIC AND ANAEROBIC BOTTLES ?CRITICAL RESULT CALLED TO, READ BACK BY AND VERIFIED WITH: PHARMD ALEX 134 [id number] FCP ?Performed at [facility], [place] [address] [place], [place] [address] ?  ? Culture GRAM POSITIVE COCCI  Final  ? Report Status PENDING  Incomplete  ?Blood Culture ID Panel (Reflexed)     Status: Abnormal  ? Collection Time: 04/26/22  8:40 PM  ?Result Value Ref Range Status  ? Enterococcus faecalis NOT DETECTED NOT DETECTED Final  ? Enterococcus Faecium NOT DETECTED NOT DETECTED Final  ? Listeria monocytogenes NOT DETECTED NOT DETECTED Final  ? Staphylococcus species DETECTED (A) NOT DETECTED Final  ?  Comment: CRITICAL RESULT CALLED TO, READ BACK BY AND VERIFIED WITH: ?PHARMD ALEX L [id number] FCP ?  ? Staphylococcus aureus (BCID) DETECTED (A) NOT DETECTED Final  ?  Comment: CRITICAL RESULT CALLED TO, READ BACK BY  AND VERIFIED WITH: ?PHARMD ALEX L [id number] FCP ?  ? Staphylococcus epidermidis NOT DETECTED NOT DETECTED Final  ? Staphylococcus lugdunensis NOT DETECTED NOT DETECTED Final  ? Streptococcus species NOT DETECTED NOT DETECTED Final  ? Streptococcus agalactiae NOT DETECTED NOT DETECTED Final  ? Streptococcus pneumoniae NOT DETECTED NOT DETECTED Final  ? Streptococcus pyogenes NOT DETECTED NOT DETECTED Final  ? A.calcoaceticus-baumannii NOT DETECTED NOT DETECTED Final  ? Bacteroides fragilis NOT DETECTED NOT DETECTED Final  ? Enterobacterales NOT DETECTED NOT DETECTED Final  ? Enterobacter cloacae complex NOT DETECTED NOT DETECTED Final  ? Escherichia coli NOT DETECTED NOT DETECTED Final  ? Klebsiella aerogenes NOT DETECTED NOT DETECTED Final  ? Klebsiella oxytoca NOT DETECTED NOT DETECTED Final  ? Klebsiella pneumoniae NOT DETECTED NOT DETECTED Final  ? Proteus species NOT DETECTED NOT DETECTED Final  ? Salmonella species NOT DETECTED NOT DETECTED Final  ? Serratia marcescens  NOT DETECTED NOT DETECTED Final  ? Haemophilus influenzae NOT DETECTED NOT DETECTED Final  ? Neisseria meningitidis NOT DETECTED NOT DETECTED Final  ? Pseudomonas aeruginosa NOT DETECTED NOT DETECTED Final  ? Stenotrophomonas maltophilia NOT DETECTED NOT DETECTED Final  ? Candida albicans NOT DETECTED NOT DETECTED Final  ? Candida auris NOT DETECTED NOT DETECTED Final  ? Candida glabrata NOT DETECTED NOT DETECTED Final  ? Candida krusei NOT DETECTED NOT DETECTED Final  ? Candida parapsilosis NOT DETECTED NOT DETECTED Final  ? Candida tropicalis NOT DETECTED NOT DETECTED Final  ? Cryptococcus neoformans/gattii NOT DETECTED NOT DETECTED Final  ? Meth resistant mecA/C and MREJ NOT DETECTED NOT DETECTED Final  ?  Comment: Performed at [facility] [address], [place], [place] [address]  ?Resp Panel by RT-PCR (Flu A&B, Covid) Nasopharyngeal Swab     Status: None  ? Collection Time: 04/26/22  9:35 PM  ? Specimen: Nasopharyngeal Swab;  Nasopharyngeal(NP) swabs in vial transport medium  ?Result Value Ref Range Status  ? SARS Coronavirus 2 by RT PCR NEGATIVE NEGATIVE Final  ?  Comment: (NOTE) ?SARS-CoV-2 target nucleic acids are NOT DETECT

## 2022-04-27 NOTE — Anesthesia Procedure Notes (Signed)
Procedure Name: Intubation ?Date/Time: 04/27/2022 1:23 PM ?Performed by: Vonna Drafts, CRNA ?Pre-anesthesia Checklist: Patient identified, Emergency Drugs available, Suction available and Patient being monitored ?Patient Re-evaluated:Patient Re-evaluated prior to induction ?Oxygen Delivery Method: Circle system utilized ?Preoxygenation: Pre-oxygenation with 100% oxygen ?Induction Type: IV induction ?Ventilation: Mask ventilation without difficulty ?Laryngoscope Size: Mac and 3 ?Grade View: Grade I ?Tube type: Oral ?Tube size: 7.0 mm ?Number of attempts: 1 ?Airway Equipment and Method: Stylet and Oral airway ?Placement Confirmation: ETT inserted through vocal cords under direct vision, positive ETCO2 and breath sounds checked- equal and bilateral ?Secured at: 21 cm ?Tube secured with: Tape ?Dental Injury: Teeth and Oropharynx as per pre-operative assessment  ? ? ? ? ?

## 2022-04-27 NOTE — Anesthesia Preprocedure Evaluation (Addendum)
Anesthesia Evaluation  ?Patient identified by MRN, date of birth, ID band ?Patient awake ? ? ? ?Reviewed: ?Allergy & Precautions, NPO status , Patient's Chart, lab work & pertinent test results ? ?History of Anesthesia Complications ?Negative for: history of anesthetic complications ? ?Airway ?Mallampati: II ? ?TM Distance: >3 FB ?Neck ROM: Full ? ? ? Dental ? ?(+) Dental Advisory Given, Poor Dentition ?  ?Pulmonary ?neg pulmonary ROS, former smoker,  ?  ?Pulmonary exam normal ?breath sounds clear to auscultation ? ? ? ? ? ? Cardiovascular ?hypertension, + CAD, + Past MI, + Peripheral Vascular Disease and +CHF  ?Normal cardiovascular exam ?Rhythm:Regular Rate:Normal ? ? ?  ?Neuro/Psych ? Headaches, Seizures -,  Anxiety Depression TIACVA   ? GI/Hepatic ?Neg liver ROS, GERD  ,  ?Endo/Other  ?negative endocrine ROS ? Renal/GU ?Dialysis and ESRFRenal diseaseDialysis AV fistula infection  ?negative genitourinary ?  ?Musculoskeletal ? ?(+) Arthritis ,  ? Abdominal ?  ?Peds ? Hematology ? ?(+) Blood dyscrasia, anemia ,   ?Anesthesia Other Findings ?Day of surgery medications reviewed with patient. ? Reproductive/Obstetrics ?negative OB ROS ? ?  ? ? ? ? ? ? ? ? ? ? ? ? ? ?  ?  ? ? ? ? ? ? ? ?Anesthesia Physical ?Anesthesia Plan ? ?ASA: 4 ? ?Anesthesia Plan: General  ? ?Post-op Pain Management: Minimal or no pain anticipated  ? ?Induction:  ? ?PONV Risk Score and Plan: 3 and Treatment may vary due to age or medical condition, Ondansetron and Dexamethasone ? ?Airway Management Planned: Oral ETT ? ?Additional Equipment: None ? ?Intra-op Plan:  ? ?Post-operative Plan: Extubation in OR ? ?Informed Consent: I have reviewed the patients History and Physical, chart, labs and discussed the procedure including the risks, benefits and alternatives for the proposed anesthesia with the patient or authorized representative who has indicated his/her understanding and acceptance.  ? ? ? ?Dental advisory  given ? ?Plan Discussed with: CRNA ? ?Anesthesia Plan Comments:   ? ? ? ? ? ?Anesthesia Quick Evaluation ? ?

## 2022-04-27 NOTE — Anesthesia Procedure Notes (Signed)
Central Venous Catheter Insertion ?Performed by: Santa Lighter, MD, anesthesiologist ?Start/End5/15/2023 4:10 PM, 04/27/2022 4:20 PM ?Preanesthetic checklist: patient identified, IV checked, site marked, risks and benefits discussed, surgical consent, monitors and equipment checked, pre-op evaluation, timeout performed and anesthesia consent ?Position: Trendelenburg ?Lidocaine 1% used for infiltration and patient sedated ?Hand hygiene performed , maximum sterile barriers used  and Seldinger technique used ?Catheter size: 8 Fr ?Total catheter length 16. ?Central line was placed.Double lumen ?Procedure performed using ultrasound guided technique. ?Ultrasound Notes:anatomy identified, needle tip was noted to be adjacent to the nerve/plexus identified, no ultrasound evidence of intravascular and/or intraneural injection and image(s) printed for medical record ?Attempts: 1 ?Following insertion, line sutured, dressing applied and Biopatch. ?Post procedure assessment: blood return through all ports, free fluid flow and no air ? ?Patient tolerated the procedure well with no immediate complications. ? ? ? ? ?

## 2022-04-27 NOTE — Op Note (Signed)
? ? ?  NAME: [name]    MRN: [id number] ?DOB: 14-Aug-1951    DATE OF OPERATION: 04/27/2022 ? ?PREOP DIAGNOSIS:   ? ?Possible infected right arm graft ? ?POSTOP DIAGNOSIS:   ? ?Possible infected right arm graft ? ?PROCEDURE:  ?  ?Excision of segment of right arm AV graft with revision (interposition 6 mm PTFE graft) ? ?SURGEON: Judeth Cornfield. Scot Dock, MD ? ?ASSIST: Arlee Muslim PA ? ?ANESTHESIA: General ? ?EBL: Minimal ? ?INDICATIONS:  ? ? [name] is a 71 y.o. female who had a right upper arm AV graft placed at [facility] on 03/15/20.  She was admitted with shortness of breath.  She was noted to have a wound over her graft and vascular surgery was consulted.  This had been bleeding intermittently.  I recommended exploration in the operating room. ? ?FINDINGS:  ? ?The area of concern was degenerative.  I excised this area and bypass around this area.  There was no gross purulence.  I did send a segment of the graft for culture. ? ?TECHNIQUE:  ? ?The patient was taken to the operating room and received a general anesthetic.  Of note anesthesia placed a radial arterial line as she was having problems with pressure and also saturation.  Pressure stabilized and we proceeded with surgery.  I made an elliptical incision over the area where the skin had broken down.  The dissection was carried down to the graft and there was arterial bleeding once this was exposed.  The graft appeared significantly degenerative in this area.  The patient was then heparinized.  A tourniquet was placed on the upper arm.  Once the heparin had circulated the tourniquet was inflated to 250 mmHg.  Under tourniquet control I could identify the graft and I felt this was significantly degenerative and this could not be repaired primarily and that this area would have to be excised. ? ?I dissected the graft out enough to clamp proximally and distally and then released the tourniquet.  I then made a separate incision  over the arterial aspect of the graft proximally in the venous aspect of the graft distally so that I could tunnel around this area.  A 6 mm PTFE graft was tunneled between the 2 incisions lateral to the old graft.  It each of the graft was clamped and spatulated and the new segment of graft was spatulated and sewn into into the graft using continuous 6-0 Prolene suture.  At the completion there was a good thrill in the graft.  I then excised the degenerative area.  The heparin was partially reversed with protamine. ? ?Once hemostasis was obtained, the area where excised the graft was closed with interrupted 3 oh nylons.  The 2 incisions over the revision were closed with a deep layer 3-0 Vicryl and the skin closed with 4-0 Monocryl.  Dermabond was applied.  Given the patient was having problems with blood pressure she will be transferred to the intensive care unit for further management.  Dr. Gifford Shave has discussed this with critical care medicine.  I have discussed this with Dr. Sloan Leiter ? ?Given the complexity of the case a first assistant was necessary in order to expedient the procedure and safely perform the technical aspects of the operation. ? ?[name], MD, FACS ?[facility] ? ?DATE OF DICTATION:   04/27/2022 ? ?

## 2022-04-28 ENCOUNTER — Encounter (HOSPITAL_COMMUNITY): Payer: Self-pay | Admitting: Vascular Surgery

## 2022-04-28 ENCOUNTER — Encounter (HOSPITAL_COMMUNITY): Payer: Medicare Other

## 2022-04-28 ENCOUNTER — Inpatient Hospital Stay (HOSPITAL_COMMUNITY): Payer: Medicare Other

## 2022-04-28 ENCOUNTER — Encounter: Payer: Medicare Other | Admitting: Vascular Surgery

## 2022-04-28 DIAGNOSIS — J9601 Acute respiratory failure with hypoxia: Secondary | ICD-10-CM | POA: Diagnosis not present

## 2022-04-28 DIAGNOSIS — R7881 Bacteremia: Secondary | ICD-10-CM | POA: Diagnosis not present

## 2022-04-28 DIAGNOSIS — T827XXD Infection and inflammatory reaction due to other cardiac and vascular devices, implants and grafts, subsequent encounter: Secondary | ICD-10-CM

## 2022-04-28 DIAGNOSIS — A419 Sepsis, unspecified organism: Secondary | ICD-10-CM | POA: Diagnosis not present

## 2022-04-28 DIAGNOSIS — T827XXA Infection and inflammatory reaction due to other cardiac and vascular devices, implants and grafts, initial encounter: Secondary | ICD-10-CM | POA: Diagnosis not present

## 2022-04-28 DIAGNOSIS — B9561 Methicillin susceptible Staphylococcus aureus infection as the cause of diseases classified elsewhere: Secondary | ICD-10-CM | POA: Diagnosis not present

## 2022-04-28 LAB — ECHOCARDIOGRAM LIMITED
AV Peak grad: 13.7 mmHg
Ao pk vel: 1.85 m/s
Calc EF: 49.4 %
Height: 62 in
MV M vel: 5.73 m/s
MV Peak grad: 131.3 mmHg
P 1/2 time: 585 msec
Radius: 0.4 cm
S' Lateral: 3.68 cm
Single Plane A2C EF: 45.2 %
Single Plane A4C EF: 53.2 %
Weight: 1615.53 oz

## 2022-04-28 LAB — CBC
HCT: 31.5 % — ABNORMAL LOW (ref 36.0–46.0)
Hemoglobin: 10.3 g/dL — ABNORMAL LOW (ref 12.0–15.0)
MCH: 32 pg (ref 26.0–34.0)
MCHC: 32.7 g/dL (ref 30.0–36.0)
MCV: 97.8 fL (ref 80.0–100.0)
Platelets: 207 10*3/uL (ref 150–400)
RBC: 3.22 MIL/uL — ABNORMAL LOW (ref 3.87–5.11)
RDW: 15.9 % — ABNORMAL HIGH (ref 11.5–15.5)
WBC: 16.1 10*3/uL — ABNORMAL HIGH (ref 4.0–10.5)
nRBC: 0 % (ref 0.0–0.2)

## 2022-04-28 LAB — GLUCOSE, CAPILLARY
Glucose-Capillary: 189 mg/dL — ABNORMAL HIGH (ref 70–99)
Glucose-Capillary: 114 mg/dL — ABNORMAL HIGH (ref 70–99)

## 2022-04-28 LAB — BASIC METABOLIC PANEL
Anion gap: 15 (ref 5–15)
BUN: 50 mg/dL — ABNORMAL HIGH (ref 8–23)
CO2: 22 mmol/L (ref 22–32)
Calcium: 8.6 mg/dL — ABNORMAL LOW (ref 8.9–10.3)
Chloride: 97 mmol/L — ABNORMAL LOW (ref 98–111)
Creatinine, Ser: 6.73 mg/dL — ABNORMAL HIGH (ref 0.44–1.00)
GFR, Estimated: 6 mL/min — ABNORMAL LOW (ref >60)
Glucose, Bld: 166 mg/dL — ABNORMAL HIGH (ref 70–99)
Potassium: 4.7 mmol/L (ref 3.5–5.1)
Sodium: 134 mmol/L — ABNORMAL LOW (ref 135–145)

## 2022-04-28 LAB — PROCALCITONIN: Procalcitonin: 23.81 ng/mL

## 2022-04-28 LAB — TRIGLYCERIDES: Triglycerides: 92 mg/dL (ref <150)

## 2022-04-28 LAB — LACTIC ACID, PLASMA: Lactic Acid, Venous: 1.6 mmol/L (ref 0.5–1.9)

## 2022-04-28 LAB — HEPATITIS B SURFACE ANTIBODY, QUANTITATIVE: Hep B S AB Quant (Post): >1000 m[IU]/mL (ref >9.9)

## 2022-04-28 MED ORDER — ORAL CARE MOUTH RINSE
15.0000 mL | Freq: Two times a day (BID) | OROMUCOSAL | Status: DC
  Administered 2022-04-28 – 2022-05-04 (×13): 15 mL via OROMUCOSAL

## 2022-04-28 NOTE — Procedures (Signed)
Extubation Procedure Note ? ?Patient Details:   ?Name: [name] ?DOB: 12/06/1951 ?MRN: [id number] ?  ?Airway Documentation:  ?  ?Vent end date: 04/28/22 Vent end time: 1140  ? ?Evaluation ? O2 sats: stable throughout ?Complications: No apparent complications ?Patient did tolerate procedure well. ?Bilateral Breath Sounds: Clear ?  ?Yes ? ?Patient was extubated to a 4L [place] without any complications, dyspnea or stridor noted. Positive cuff leak prior to extubation.  ? ?Claretta Fraise ?04/28/2022, 11:40 AM ? ?

## 2022-04-28 NOTE — Progress Notes (Signed)
? ?NAME:  [name], MRN:  [id number], DOB:  1951-05-20, LOS: 2 ?ADMISSION DATE:  04/26/2022, CONSULTATION DATE:  04/28/22 ?REFERRING MD:  Vascular, CHIEF COMPLAINT:  Hypotension  ? ?History of Present Illness:  ?Shenel Gutterman is a 71 y.o. F with PMH significant for ESRD on HD, CHF, CAD, HL, HTN who presented to the ED on 5/14 with purulent drainage from the AV fistula on the RUE.  She reported a "bug bite" on the site.   She was admitted to the hospitalist service and started on Cefepime and Vancomycin and taken to the OR for I&D on 5/15.  Peri-operatively pt's blood pressure dropped and she was started on Levophed and PCCM consulted  ? ?Pertinent  Medical History  ? has a past medical history of Acute ischemic stroke (HCC) (02/23/2017), Acute on chronic diastolic CHF (congestive heart failure) (HCC), Adenomatous polyp of colon (10/2010, 2006, 2015), Anemia in CKD (chronic kidney disease) (11/07/2012), Arthritis, CAD (coronary artery disease), CHF (congestive heart failure) (HCC), Constipation, Critical limb ischemia of left lower extremity with ulceration of foot (HCC) (03/17/2022), Depression with anxiety, Diverticula, colon, Diverticulitis of colon with perforation (08/06/2021), ESRD (end stage renal disease) (HCC) (11/07/2012), GERD (gastroesophageal reflux disease), GI bleed (2017), Headache, Hyperlipidemia, Hypertension, Neurologic gait dysfunction, Neuromuscular disorder (HCC), NSTEMI (non-ST elevated myocardial infarction) (HCC) (05/14/2020), Osteoporosis, Pneumonia, Pseudoaneurysm of surgical AV fistula (HCC), Pulmonary edema (12/2019), Stroke (HCC) (11/2015), TIA (transient ischemic attack) (02/21/2021), and Weight loss, unintentional. ? ? ?Significant Hospital Events: ?Including procedures, antibiotic start and stop dates in addition to other pertinent events   ?5/14 admitted with RUE AV graft infection, started on Vanc/Cefepime ?5/15 Taken to OR for graft revision, hypotensive and started on Levophed,  PCCM consulted, intubated ?5/16 Extubated, on low dose levophed ? ?Interim History / Subjective:  ?Improving overnight, extubated today and only on Levophed ? ?Objective   ?Blood pressure (!) 97/43, pulse 62, temperature 97.7 ?F (36.5 ?C), temperature source Axillary, resp. rate (!) 27, height 5\' 2"  (1.575 m), weight 45.8 kg, SpO2 100 %. ?   ?Vent Mode: PRVC ?FiO2 (%):  [40 %-100 %] 40 % ?Set Rate:  [20 bmp-26 bmp] 26 bmp ?Vt Set:  [400 mL] 400 mL ?PEEP:  [5 cmH20] 5 cmH20 ?Plateau Pressure:  [14 cmH20-16 cmH20] 15 cmH20  ? ?Intake/Output Summary (Last 24 hours) at 04/28/2022 0838 ?Last data filed at 04/28/2022 0700 ?Gross per 24 hour  ?Intake 2070.52 ml  ?Output 200 ml  ?Net 1870.52 ml  ? ? ?Filed Weights  ? 04/27/22 2015 04/28/22 0500  ?Weight: 51.3 kg 45.8 kg  ? ?General:  ill-appearing F, intubated in no distress ?HEENT: MM pink/moist, sclera anicteric ?Neuro: awake and alert, following commands ?CV: s1s2 rrr, no m/r/g ?PULM:  mechanical breath sounds bilaterally with equal chest rise and no distress ?GI: soft, bsx4 active  ?Extremities: warm/dry, no peripheral edema, RUE fistula site without drainage or bleeding ?Skin: no rashes or lesions ? ? ?Resolved Hospital Problem list   ? ? ?Assessment & Plan:  ?Shock, septic vs vasoplegic in the setting of AV graft infection, staph bacteremia and anesthesia for graft excision and revision ?-decreasing pressor requirement today, ?-continue Vanc/cefepime pending final culture results ? ?Acute respiratory failure with hypoxia ?-extubated successfully to [place] ? ?End-stage renal disease ?-Nephrology is on board. Per [facility] can use new AVG for iHD toda ? ?Anemia of chronic renal disease ?Monitor CBC ? ?Hypertension, diastolic CHF ?Hold outpatient medications ? ?Best Practice (right click and "Reselect all SmartList Selections"  daily)  ? ?Diet/type: Regular consistency (see orders) ?DVT prophylaxis: prophylactic heparin  ?GI prophylaxis: PPI ?Lines: Central line ?Foley:   removal ordered  ?Code Status:  full code ?Last date of multidisciplinary goals of care discussion []  Pt's son updated at the bedside  ? ?Critical care time:  35 minutes  ? ?CRITICAL CARE ?Performed by: Darcella Gasman Sanaiyah Kirchhoff ? ? ?Total critical care time: 35 minutes ? ?Critical care time was exclusive of separately billable procedures and treating other patients. ? ?Critical care was necessary to treat or prevent imminent or life-threatening deterioration. ? ?Critical care was time spent personally by me on the following activities: development of treatment plan with patient and/or surrogate as well as nursing, discussions with consultants, evaluation of patient's response to treatment, examination of patient, obtaining history from patient or surrogate, ordering and performing treatments and interventions, ordering and review of laboratory studies, ordering and review of radiographic studies, pulse oximetry and re-evaluation of patient's condition. ? ?Darcella Gasman Suzanne Garbers, PA-C ?[facility] ?See Amion for pager ?If no response to pager , please call [phone] until 7pm ?After 7:00 pm call Elink  [phone] ? ? ? ?

## 2022-04-28 NOTE — Progress Notes (Addendum)
Initial Nutrition Assessment ? ?DOCUMENTATION CODES:  ? ?Severe malnutrition in context of chronic illness ? ?INTERVENTION:  ? ?Recommend Dysphagia 3 diet (given poor dentition) with no restrictions at this time given severe malnutrition, inadequate oral intake, wt loss as evidenced by EDW trending down ? ?Ensure Enlive po BID, each supplement provides 350 kcal and 20 grams of protein. ? ?Add Renal MVI daily ? ?NUTRITION DIAGNOSIS:  ? ?Severe Malnutrition related to chronic illness (long standing ESRD on HD- hx renal transplant, CHF) as evidenced by severe muscle depletion, severe fat depletion. ? ?GOAL:  ? ?Patient will meet greater than or equal to 90% of their needs ? ?MONITOR:  ? ?PO intake, Supplement acceptance, Labs, Weight trends, Skin ? ?REASON FOR ASSESSMENT:  ? ?Rounds ?  ? ?ASSESSMENT:  ? ?71 yo female admitted with  RUE AV graft infection requiring surgical intervention, shock. PMH includes ESRD on HD-hx of kidney transplant in 1996 with return to HD in 2012, CHF, PVD, stroke, malnutrition and unintentional wt loss, diverticulitis ? ?5/14 Admitted ?5/15 Revision of Right Upper Arm AV Graft (bypassed area around the segment of graft that had to be removed due to degeneration-graft can be cannulated above incisions with new segment of graft ok for cannulation in 4 weeks. ?5/16 Extubated, diet advanced to CL ? ?Pt alert and wake on visit today, eating popsicle/italian ice. Per report, pt was up all night and reports being a "night owl" and ps sleeps a lot throughout the day.  ? ?Tolerating CL diet well this AM. Discussed diet advancement with PCCM and received order to advance diet; recommend Dysphagia 3 at this time, pt with poor dentition ? ?Pt's son and daughter in law at bedside; family reports pt with fair intake at home. Pt has poor appetite and has to be encouraged to eat. On dialysis days, pt eats breakfast but then does to HD at 11 and is there until 5pm, and is usually too tired/weak to eat  dinner. Pt eats better on non-HD days but again has poor intake, needs reminding and encouragement. Pt reports receiving Ensure and Protein Modular at iHD. Pt also has Ensure at home but does not drink every day.  ? ?Outpatient ED in August of 2022 was 49.5 kg; current wt 49.6 kg. However, noted current outpatient EDW 45 kg indicating weight loss over the last 9 months or so. Pt met criteria for severe malnutrition in August of 2022 and continues to meet criteria for the same.  ? ?Pt with worsening physical function at baseline. Not ambulating much.  ? ?Noted pt with Dialyvite daily as outpatient-son reports pt has been taking; plan to oral Rena-Vite while in patient. Pt takes Auryxia 420 mg TID and Renvela TID but states not taking these either.  ? ?Labs: sodium 134 (L), no phosphorus ?Meds: reviewed ? ?NUTRITION - FOCUSED PHYSICAL EXAM: ? ?Flowsheet Row Most Recent Value  ?Orbital Region Moderate depletion  ?Upper Arm Region Severe depletion  ?Thoracic and Lumbar Region Severe depletion  ?Buccal Region Unable to assess  ?Temple Region Severe depletion  ?Clavicle Bone Region Severe depletion  ?Clavicle and Acromion Bone Region Severe depletion  ?Scapular Bone Region Severe depletion  ?Dorsal Hand Unable to assess  ?Patellar Region Severe depletion  ?Anterior Thigh Region Severe depletion  ?Posterior Calf Region Severe depletion  ?Edema (RD Assessment) Mild  [RUE only]  ? ?  ? ? ?Diet Order:   ?Diet Order   ? ?       ?  DIET  DYS 3 Room service appropriate? Yes; Fluid consistency: Thin  Diet effective now       ?  ? ?  ?  ? ?  ? ? ?EDUCATION NEEDS:  ? ?Education needs have been addressed ? ?Skin:  Skin Assessment: Skin Integrity Issues: ?Skin Integrity Issues:: Incisions ?Incisions: AV graft revision, stiches in place ? ?Last BM:  5/17 ? ?Height:  ? ?Ht Readings from Last 1 Encounters:  ?04/27/22 5' 2"  (1.575 m)  ? ? ?Weight:  ? ?Wt Readings from Last 1 Encounters:  ?04/28/22 49.6 kg  ? ? ? ?BMI:  Body mass index is  20 kg/m?. ? ?Estimated Nutritional Needs:  ? ?Kcal:  1575-1775 kcals ? ?Protein:  70-80g ? ?Fluid:  1000 mL plus UOP ? ? ?Kerman Passey MS, RDN, LDN, CNSC ?Registered Dietitian III ?Clinical Nutrition ?RD Pager and On-Call Pager Number Located in [place]  ? ? ?

## 2022-04-28 NOTE — Progress Notes (Signed)
remove 1151ms net fluid.  goal 1 to 2 liters.  hypotension when trying to remove more.  pre bp 107/48 post bp 91/67  pre weight 51.0kg post weight 49.5kg bed scales .  2 bandages ro rua avg no bleeding dressing cd.  very tender for patient when pulling needles and holding pressure. ?

## 2022-04-28 NOTE — Progress Notes (Signed)
[facility] ?Progress Note ? ?Subjective: pt went to OR last night, no purulent material noted. Had area of degenerated AVG material which was resected and a new segment of AVG material was inserted. Pt on vent this am but off pressors.  Blood cx's + for staph aureus.  ? ?Vitals:  ? 04/28/22 0600 04/28/22 0700 04/28/22 0800 04/28/22 0805  ?BP: (!) 111/45 (!) 118/41  (!) 97/43  ?Pulse: 62 70  62  ?Resp: (!) 26 (!) 26  (!) 27  ?Temp:   97.7 ?F (36.5 ?C)   ?TempSrc:   Axillary   ?SpO2: 100% 100%    ?Weight:      ?Height:      ? ? ?Exam: ?on vent ,alert and awake and responsive ? no jvd ? throat ett in place ? Chest cta bilat and lat ? Cor reg no RG ? Abd soft ntnd no ascites ?  Ext no LE edema ?  Neuro on vent  ?  RUA AVG+bruit ?  ?  ? Home meds include - zyloprim 150, xanax, asa, lipitor, auryxia 2 ac, coreg 6.25 bid, plavix, lexapro, ensure bid, losartan 50, lopressor 12.5 bid, protonix, sevelamer 2 ac, prns/ vits/ supps ?  ?CXR - chronic vasc congestion  ?  ?  ? OP HD: MWF [place] ? 4h  350/600  45kg   2/2 bath  P2  AVG RUA  Hep none ?- last HD 5/12, 48.5 > 46.2kg ?- last Hb 10.8 on 5/08 ?- mircera 50 q4, last 4/21, due 5/19 ?- hectorol 2 ug IV tiw ?- sensipar 30 tiw po ?  ?  ?Assessment/ Plan: ?R arm draining wound - sp AVG revision by VVS yesterday 5/15 (small segment resected due to degeneration and new segment AVG replaced). Per VVS can stick above the wound for HD today.  ?Staph sepsis - +blood cx's, ID consulting. Getting IV abx.  ?ESRD - on HD MWF.  HD today off schedule in ICU.  Will attempt to use AVG as above.  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 Cont IV vdra and po sensipar and binders.  ?HTN/ volume - hypotensive periop yesterday and  was started on vasopressors, weaning down today. Holding home antiHTNsives. No vol excess, at dry wt.  ? ? ? ? ?Rob Doctor, hospital ?04/28/2022, 8:57 AM ? ? ?Recent Labs  ?Lab 04/27/22 ?1105 04/27/22 ?1352 04/27/22 ?1958  04/28/22 ?0410  ?HGB 11.9*   < > 10.9* 10.3*  ?ALBUMIN 2.9*  --  3.1*  --   ?CALCIUM 8.9  --  8.3* 8.6*  ?CREATININE 6.36*  --  6.40* 6.73*  ?K 5.0   < > 4.8 4.7  ? < > = values in this interval not displayed.  ? ?Inpatient medications: ? chlorhexidine gluconate (MEDLINE KIT)  15 mL Mouth Rinse BID  ? Chlorhexidine Gluconate Cloth  6 each Topical Q0600  ? clopidogrel  75 mg Oral Daily  ? clotrimazole  1 Applicatorful Vaginal QHS  ? docusate  100 mg Per Tube BID  ? heparin  5,000 Units Subcutaneous Q8H  ? mouth rinse  15 mL Mouth Rinse 10 times per day  ? mupirocin ointment  1 application. Nasal BID  ? polyethylene glycol  17 g Per Tube Daily  ? sodium chloride flush  10-40 mL Intracatheter Q12H  ? ? sodium chloride 10 mL/hr at 04/28/22 0700  ?  ceFAZolin (ANCEF) IV Stopped (04/27/22 2351)  ?  norepinephrine (LEVOPHED) Adult infusion 4 mcg/min (04/28/22 0700)  ? propofol (DIPRIVAN) infusion 20 mcg/kg/min (04/28/22 0700)  ? ?sodium chloride, acetaminophen **OR** acetaminophen, albuterol, fentaNYL (SUBLIMAZE) injection, sodium chloride flush ? ? ? ? ? ? ?

## 2022-04-28 NOTE — Progress Notes (Signed)
Echocardiogram ?2D Echocardiogram has been performed. ? ?[name]  [name] ?04/28/2022, 10:26 AM ?

## 2022-04-28 NOTE — Progress Notes (Signed)
Inpatient Diabetes Program Recommendations ? ?AACE/ADA: New Consensus Statement on Inpatient Glycemic Control (2015) ? ?Target Ranges:  Prepandial:   less than 140 mg/dL ?     Peak postprandial:   less than 180 mg/dL (1-2 hours) ?     Critically ill patients:  140 - 180 mg/dL  ? ?Lab Results  ?Component Value Date  ? GLUCAP 189 (H) 04/28/2022  ? HGBA1C 5.0 02/22/2021  ? ? ?Review of Glycemic Control ? Latest Reference Range & Units 04/27/22 15:57 04/27/22 16:19 04/27/22 19:42 04/28/22 01:56  ?Glucose-Capillary 70 - 99 mg/dL 65 (L) 120 (H) 179 (H) 189 (H)  ?(L): Data is abnormally low ?(H): Data is abnormally high ?Diabetes history: none ?Outpatient Diabetes medications: none ?Current orders for Inpatient glycemic control: none ?Decadron 10 mg x 1 ?Inpatient Diabetes Program Recommendations:   ? ?Noted mild hypoglycemia while intraop. Received steroids. May want to follow CBGs Q4H.  ? ?Thanks, ?Bronson Curb, MSN, RNC-OB ?Diabetes Coordinator ?[phone] (8a-5p) ? ? ? ?

## 2022-04-28 NOTE — Progress Notes (Signed)
?   ? ? ? ? ?[facility] ? ?Date of Admission:  04/26/2022    ? ?Abx: ?5/15-c cefazolin ? ?ASSESSMENT: ?71 yo female esrd on iHD rue avf (also old nonfunctional lue avf) here with RUE graft site infection/mssa bsi ? ?5/16 repeat bcx in progress ?5/15 graft/tissue cx gpc in clusters ?5/14 bcx mssa ? ?She has recent rle stent without concerning pain/metastatic focus of infection there. She denies localizing pain else where ? ?5/16 tte no concern for valve involvement ? 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 ? ?PLAN: ?Continue cefazolin ?F/u repeat blood cx ?Discussed with primary team ? ? ?I spent more than 35 minute reviewing data/chart, and coordinating care and >50% direct face to face time providing counseling/discussing diagnostics/treatment plan with patient ? ? ?Principal Problem: ?  Acute respiratory failure with hypoxia ([place]) ?Active Problems: ?  End-stage renal disease on hemodialysis ([place]) ?  HTN (hypertension) ?  Chronic diastolic CHF (congestive heart failure) ([place]) ?  Anemia of chronic renal failure ?  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[place]) ?  Sepsis ([place]) ?  MSSA bacteremia ? ? ?Allergies  ?Allergen Reactions  ? Sulfa Antibiotics Other (See Comments)  ?  Per patient, both parents allergic-so will not take  ? Adhesive [Tape] Itching  ? ? ?Scheduled Meds: ? Chlorhexidine Gluconate Cloth  6 each Topical Q0600  ? clopidogrel  75 mg Oral Daily  ? clotrimazole  1 Applicatorful Vaginal QHS  ? docusate  100 mg Per Tube BID  ? heparin  5,000 Units Subcutaneous Q8H  ? mouth rinse  15 mL Mouth Rinse BID  ? mupirocin ointment  1 application. Nasal BID  ? polyethylene glycol  17 g Per Tube Daily  ? sodium chloride flush  10-40 mL Intracatheter Q12H  ? ?Continuous Infusions: ? sodium chloride 10 mL/hr at 04/28/22 1400  ?  ceFAZolin  (ANCEF) IV Stopped (04/27/22 2351)  ? norepinephrine (LEVOPHED) Adult infusion 2 mcg/min (04/28/22 1400)  ? ?PRN Meds:.sodium chloride, acetaminophen **OR** acetaminophen, albuterol, fentaNYL (SUBLIMAZE) injection, sodium chloride flush ? ? ?SUBJECTIVE: ?Doing well ?Extubated ?Pain in [place] area ?No other focal pain ?No n/v/diarrhea ? ? ?Review of Systems: ?ROS ?All other ROS was negative, except mentioned above ? ? ? ? ?OBJECTIVE: ?Vitals:  ? 04/28/22 1545 04/28/22 1600 04/28/22 1615 04/28/22 1630  ?BP: (!) 1'00/47 99/60 91/68 '$ 99/64  ?Pulse: 65 66 63 71  ?Resp: '12 18 12 18  '$ ?Temp:      ?TempSrc:      ?SpO2:      ?Weight:      ?Height:      ? ?Body mass index is 20.56 kg/m?. ? ?Physical Exam ? ?General/constitutional: no distress, pleasant ?HEENT: Normocephalic, PER, Conj Clear, EOMI, Oropharynx clear ?Neck supple ?CV: rrr no mrg ?Lungs: clear to auscultation, normal respiratory effort ?Abd: Soft, Nontender ?Ext/skin; rue graft site incision intact no cellulitis change and no purulence; lue old avf site nontender; RLE nontender  ?Neuro: nonfocal ? ?Lab Results ?Lab Results  ?Component Value Date  ? WBC 16.1 (H) 04/28/2022  ? HGB 10.3 (L) 04/28/2022  ? HCT 31.5 (L) 04/28/2022  ? MCV 97.8 04/28/2022  ? PLT 207 04/28/2022  ?  ?Lab Results  ?Component Value Date  ? CREATININE 6.73 (H) 04/28/2022  ? BUN 50 (H) 04/28/2022  ? NA 134 (  L) 04/28/2022  ? K 4.7 04/28/2022  ? CL 97 (L) 04/28/2022  ? CO2 22 04/28/2022  ?  ?Lab Results  ?Component Value Date  ? ALT 63 (H) 04/27/2022  ? AST 144 (H) 04/27/2022  ? ALKPHOS 189 (H) 04/27/2022  ? BILITOT 1.2 04/27/2022  ?  ? ? ?Microbiology: ?Recent Results (from the past 240 hour(s))  ?Blood Culture (routine x 2)     Status: Abnormal (Preliminary result)  ? Collection Time: 04/26/22  8:40 PM  ? Specimen: BLOOD  ?Result Value Ref Range Status  ? Specimen Description BLOOD  Final  ? Special Requests   Final  ?  RIGHT EJ BOTTLES DRAWN AEROBIC AND ANAEROBIC Blood Culture adequate volume  ?  Culture  Setup Time   Final  ?  GRAM POSITIVE COCCI ?IN BOTH AEROBIC AND ANAEROBIC BOTTLES ?CRITICAL RESULT CALLED TO, READ BACK BY AND VERIFIED WITH: PHARMD ALEX 134 [id number] FCP ?  ? Culture (A)  Final  ?  STAPHYLOCOCCUS AUREUS ?SUSCEPTIBILITIES TO FOLLOW ?Performed at [facility], [place] [address], [place], [place] [address] ?  ? Report Status PENDING  Incomplete  ?Blood Culture ID Panel (Reflexed)     Status: Abnormal  ? Collection Time: 04/26/22  8:40 PM  ?Result Value Ref Range Status  ? Enterococcus faecalis NOT DETECTED NOT DETECTED Final  ? Enterococcus Faecium NOT DETECTED NOT DETECTED Final  ? Listeria monocytogenes NOT DETECTED NOT DETECTED Final  ? Staphylococcus species DETECTED (A) NOT DETECTED Final  ?  Comment: CRITICAL RESULT CALLED TO, READ BACK BY AND VERIFIED WITH: ?PHARMD ALEX L [id number] FCP ?  ? Staphylococcus aureus (BCID) DETECTED (A) NOT DETECTED Final  ?  Comment: CRITICAL RESULT CALLED TO, READ BACK BY AND VERIFIED WITH: ?PHARMD ALEX L [id number] FCP ?  ? Staphylococcus epidermidis NOT DETECTED NOT DETECTED Final  ? Staphylococcus lugdunensis NOT DETECTED NOT DETECTED Final  ? Streptococcus species NOT DETECTED NOT DETECTED Final  ? Streptococcus agalactiae NOT DETECTED NOT DETECTED Final  ? Streptococcus pneumoniae NOT DETECTED NOT DETECTED Final  ? Streptococcus pyogenes NOT DETECTED NOT DETECTED Final  ? A.calcoaceticus-baumannii NOT DETECTED NOT DETECTED Final  ? Bacteroides fragilis NOT DETECTED NOT DETECTED Final  ? Enterobacterales NOT DETECTED NOT DETECTED Final  ? Enterobacter cloacae complex NOT DETECTED NOT DETECTED Final  ? Escherichia coli NOT DETECTED NOT DETECTED Final  ? Klebsiella aerogenes NOT DETECTED NOT DETECTED Final  ? Klebsiella oxytoca NOT DETECTED NOT DETECTED Final  ? Klebsiella pneumoniae NOT DETECTED NOT DETECTED Final  ? Proteus species NOT DETECTED NOT DETECTED Final  ? Salmonella species NOT DETECTED NOT DETECTED Final  ? Serratia marcescens NOT  DETECTED NOT DETECTED Final  ? Haemophilus influenzae NOT DETECTED NOT DETECTED Final  ? Neisseria meningitidis NOT DETECTED NOT DETECTED Final  ? Pseudomonas aeruginosa NOT DETECTED NOT DETECTED Final  ? Stenotrophomonas maltophilia NOT DETECTED NOT DETECTED Final  ? Candida albicans NOT DETECTED NOT DETECTED Final  ? Candida auris NOT DETECTED NOT DETECTED Final  ? Candida glabrata NOT DETECTED NOT DETECTED Final  ? Candida krusei NOT DETECTED NOT DETECTED Final  ? Candida parapsilosis NOT DETECTED NOT DETECTED Final  ? Candida tropicalis NOT DETECTED NOT DETECTED Final  ? Cryptococcus neoformans/gattii NOT DETECTED NOT DETECTED Final  ? Meth resistant mecA/C and MREJ NOT DETECTED NOT DETECTED Final  ?  Comment: Performed at [facility], [address] [place], [place] [address]  ?Resp Panel by RT-PCR (Flu A&B, Covid) Nasopharyngeal  Swab     Status: None  ? Collection Time: 04/26/22  9:35 PM  ? Specimen: Nasopharyngeal Swab; Nasopharyngeal(NP) swabs in vial transport medium  ?Result Value Ref Range Status  ? SARS Coronavirus 2 by RT PCR NEGATIVE NEGATIVE Final  ?  Comment: (NOTE) ?SARS-CoV-2 target nucleic acids are NOT DETECTED. ? ?The SARS-CoV-2 RNA is generally detectable in upper respiratory ?specimens during the acute phase of infection. The lowest ?concentration of SARS-CoV-2 viral copies this assay can detect is ?138 copies/mL. A negative result does not preclude SARS-Cov-2 ?infection and should not be used as the sole basis for treatment or ?other patient management decisions. A negative result may occur with  ?improper specimen collection/handling, submission of specimen other ?than nasopharyngeal swab, presence of viral mutation(s) within the ?areas targeted by this assay, and inadequate number of viral ?copies(<138 copies/mL). A negative result must be combined with ?clinical observations, patient history, and epidemiological ?information. The expected result is Negative. ? ?Fact Sheet for  Patients:  ?EntrepreneurPulse.com.au ? ?Fact Sheet for Healthcare Providers:  ?IncredibleEmployment.be ? ?This test is no t yet approved or cleared by the [place] FDA and  ?has

## 2022-04-28 NOTE — Progress Notes (Addendum)
?Progress Note ? ?VASCULAR SURGERY ASSESSMENT & PLAN:  ? ?POD 1 S/P REVISION RIGHT UPPER ARM AV GRAFT: I revised her right upper arm graft by bypassing around the segment of graft that had to be removed because of degeneration.  The graft can be cannulated above the incisions.  The new segment of graft can be cannulated in 4 weeks.  She will need her sutures out in 2 weeks.  ? ?ID: Gram stain no white blood cells.  Rare gram-positive cocci.  The patient is on intravenous Ancef. ? ?Gae Gallop, MD ?9:45 AM ? ? ?04/28/2022 ?6:51 AM ?1 Day Post-Op ? ?Subjective:  intubated-awake ? ?afebrile ? ?Vitals:  ? 04/28/22 0500 04/28/22 0600  ?BP: (!) 114/40 (!) 111/45  ?Pulse: 63 62  ?Resp: (!) 26 (!) 26  ?Temp:    ?SpO2: 100% 100%  ? ? ?Physical Exam: ?Incisions:  scant bloody drainage around nylon sutures; otherwise looks fine ?Extremities:  +thrill in graft ? ? ?CBC ?   ?Component Value Date/Time  ? WBC 16.1 (H) 04/28/2022 0410  ? RBC 3.22 (L) 04/28/2022 0410  ? HGB 10.3 (L) 04/28/2022 0410  ? HGB 10.6 (L) 07/13/2018 9735  ? HGB 8.7 (L) 09/24/2011 1038  ? HCT 31.5 (L) 04/28/2022 0410  ? HCT 33.0 (L) 07/13/2018 3299  ? HCT 27.3 (L) 09/24/2011 1038  ? PLT 207 04/28/2022 0410  ? PLT 203 07/13/2018 0814  ? MCV 97.8 04/28/2022 0410  ? MCV 91 07/13/2018 0814  ? MCV 94.5 09/24/2011 1038  ? MCH 32.0 04/28/2022 0410  ? MCHC 32.7 04/28/2022 0410  ? RDW 15.9 (H) 04/28/2022 0410  ? RDW 16.9 (H) 07/13/2018 2426  ? RDW 20.1 (H) 09/24/2011 1038  ? LYMPHSABS 0.8 04/27/2022 1105  ? LYMPHSABS 1.1 09/24/2011 1038  ? MONOABS 1.3 (H) 04/27/2022 1105  ? MONOABS 0.6 09/24/2011 1038  ? EOSABS 0.1 04/27/2022 1105  ? EOSABS 0.1 09/24/2011 1038  ? BASOSABS 0.1 04/27/2022 1105  ? BASOSABS 0.0 09/24/2011 1038  ? ? ?BMET ?   ?Component Value Date/Time  ? NA 134 (L) 04/28/2022 0410  ? NA 140 07/13/2018 0814  ? K 4.7 04/28/2022 0410  ? CL 97 (L) 04/28/2022 0410  ? CO2 22 04/28/2022 0410  ? GLUCOSE 166 (H) 04/28/2022 0410  ? BUN 50 (H) 04/28/2022 0410  ?  BUN 45 (H) 07/13/2018 8341  ? CREATININE 6.73 (H) 04/28/2022 0410  ? CALCIUM 8.6 (L) 04/28/2022 0410  ? CALCIUM 9.6 08/03/2011 1005  ? GFRNONAA 6 (L) 04/28/2022 0410  ? GFRAA 7 (L) 07/26/2020 0920  ? ? ?INR ?   ?Component Value Date/Time  ? INR 1.2 02/21/2021 2154  ? ? ? ?Intake/Output Summary (Last 24 hours) at 04/28/2022 0651 ?Last data filed at 04/28/2022 0500 ?Gross per 24 hour  ?Intake 2002.03 ml  ?Output 200 ml  ?Net 1802.03 ml  ? ?Component 1 d ago  ?Specimen Description WOUND   ?Special Requests AV GRAFT PT ON ANCEF   ?Gram Stain NO WBC SEEN  ?RARE GRAM POSITIVE COCCI IN PAIRS  ?Performed at [facility], [place] [address], [place], [place] [address]   ?Culture PENDING   ?Report Status PENDING   ? ? ?Assessment/Plan:  71 y.o. female is s/p:  ?Excision of segment of right arm AV graft with revision (interposition 6 mm PTFE graft) ?   ?1 Day Post-Op ? ? ?-felt the area of concern was degenerative and no gross purulence was noted.  Gram stain with rare GPC  in pairs but await final sensitivities.  ?-+thrill in graft and incision looks fine with nylon sutures in place. Per Dr. Scot Dock, ok to stick above the incisions. ? ? ?Leontine Locket, PA-C ?[facility] ?[phone] ?04/28/2022 ?6:51 AM ? ?I have interviewed the patient and examined the patient. I agree with the findings by the PA. ? ?Gae Gallop, MD ? ? ?

## 2022-04-29 LAB — RENAL FUNCTION PANEL
Albumin: 2.7 g/dL — ABNORMAL LOW (ref 3.5–5.0)
Anion gap: 12 (ref 5–15)
BUN: 30 mg/dL — ABNORMAL HIGH (ref 8–23)
CO2: 26 mmol/L (ref 22–32)
Calcium: 8.7 mg/dL — ABNORMAL LOW (ref 8.9–10.3)
Chloride: 99 mmol/L (ref 98–111)
Creatinine, Ser: 4.76 mg/dL — ABNORMAL HIGH (ref 0.44–1.00)
GFR, Estimated: 9 mL/min — ABNORMAL LOW (ref >60)
Glucose, Bld: 110 mg/dL — ABNORMAL HIGH (ref 70–99)
Phosphorus: 3.9 mg/dL (ref 2.5–4.6)
Potassium: 3.4 mmol/L — ABNORMAL LOW (ref 3.5–5.1)
Sodium: 137 mmol/L (ref 135–145)

## 2022-04-29 LAB — CBC
HCT: 25.9 % — ABNORMAL LOW (ref 36.0–46.0)
Hemoglobin: 8.7 g/dL — ABNORMAL LOW (ref 12.0–15.0)
MCH: 32.5 pg (ref 26.0–34.0)
MCHC: 33.6 g/dL (ref 30.0–36.0)
MCV: 96.6 fL (ref 80.0–100.0)
Platelets: 147 10*3/uL — ABNORMAL LOW (ref 150–400)
RBC: 2.68 MIL/uL — ABNORMAL LOW (ref 3.87–5.11)
RDW: 15.9 % — ABNORMAL HIGH (ref 11.5–15.5)
WBC: 7.3 10*3/uL (ref 4.0–10.5)
nRBC: 0 % (ref 0.0–0.2)

## 2022-04-29 LAB — CULTURE, BLOOD (ROUTINE X 2)

## 2022-04-29 LAB — LACTIC ACID, PLASMA: Lactic Acid, Venous: 1.3 mmol/L (ref 0.5–1.9)

## 2022-04-29 LAB — PROCALCITONIN: Procalcitonin: 24.01 ng/mL

## 2022-04-29 MED ORDER — ENSURE ENLIVE PO LIQD
237.0000 mL | Freq: Two times a day (BID) | ORAL | Status: DC
  Administered 2022-04-29 – 2022-05-04 (×9): 237 mL via ORAL

## 2022-04-29 MED ORDER — RENA-VITE PO TABS
1.0000 | ORAL_TABLET | Freq: Every day | ORAL | Status: DC
  Administered 2022-04-29 – 2022-05-03 (×5): 1 via ORAL
  Filled 2022-04-29 (×5): qty 1

## 2022-04-29 NOTE — Progress Notes (Addendum)
? ?  VASCULAR SURGERY ASSESSMENT & PLAN:  ? ?POD 1 S/P REVISION RIGHT UPPER ARM AV GRAFT: I revised her right upper arm graft by bypassing around the segment of graft that had to be removed because of degeneration.  The graft was used successfully yesterday.  The new segment of graft can be cannulated in 4 weeks.  She will need her sutures out in 2 weeks.  I have arranged follow-up. ?  ?ID: The culture grew rare Staph aureus. The patient is on intravenous Ancef pending sensitivity results ?  ?Vascular surgery will be available as needed. ? ?SUBJECTIVE:  ? ?No complaints this morning. ? ?PHYSICAL EXAM:  ? ?Vitals:  ? 04/29/22 0400 04/29/22 0500 04/29/22 0600 04/29/22 0756  ?BP: (!) 113/46 (!) 115/43 101/81   ?Pulse:      ?Resp: '19 16 13   '$ ?Temp:    99 ?F (37.2 ?C)  ?TempSrc:    Oral  ?SpO2: 100%     ?Weight:      ?Height:      ? ?Her right upper arm graft has a good thrill. ?Her incisions look fine. ? ?LABS:  ? ?Lab Results  ?Component Value Date  ? WBC 7.3 04/29/2022  ? HGB 8.7 (L) 04/29/2022  ? HCT 25.9 (L) 04/29/2022  ? MCV 96.6 04/29/2022  ? PLT 147 (L) 04/29/2022  ? ?CBG (last 3)  ?Recent Labs  ?  04/27/22 ?1942 04/28/22 ?0156 04/28/22 ?2003  ?GLUCAP 179* 189* 114*  ? ? ?PROBLEM LIST:   ? ?Principal Problem: ?  Acute respiratory failure with hypoxia ([place]) ?Active Problems: ?  End-stage renal disease on hemodialysis ([place]) ?  HTN (hypertension) ?  Chronic diastolic CHF (congestive heart failure) ([place]) ?  Anemia of chronic renal failure ?  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[place]) ?  Sepsis ([place]) ?  MSSA bacteremia ? ? ?CURRENT MEDS:  ? ? Chlorhexidine Gluconate Cloth  6 each Topical Q0600  ? clopidogrel  75 mg Oral Daily  ? docusate  100 mg Per Tube BID  ? heparin  5,000 Units Subcutaneous Q8H  ? mouth rinse  15 mL Mouth Rinse BID  ? mupirocin ointment  1 application. Nasal BID  ? polyethylene glycol  17 g Per Tube Daily  ? sodium chloride flush  10-40 mL Intracatheter Q12H  ? ? ?[name] ?Office:  [phone] ?04/29/2022 ? ?

## 2022-04-29 NOTE — Progress Notes (Signed)
[facility] ?Progress Note ? ?Subjective: had HD yesterday using the AVG w/o difficulty. On low dose levo this am. Pt groggy but Ox 3.  ? ?Vitals:  ? 04/29/22 0600 04/29/22 0700 04/29/22 0756 04/29/22 0800  ?BP: 101/81 (!) 110/39  (!) 109/45  ?Pulse:      ?Resp: '13 13  12  '$ ?Temp:   99 ?F (37.2 ?C)   ?TempSrc:   Oral   ?SpO2:    100%  ?Weight:      ?Height:      ? ? ?Exam: ? alert, nad  ? no jvd ? Chest cta bilat ? Cor reg no RG ? Abd soft ntnd no ascites ?  Ext no LE edema ?  Lethargic , NF, ox3 ?  RUA AVG+bruit ?  ?  ? Home meds include - zyloprim 150, xanax, asa, lipitor, auryxia 2 ac, coreg 6.25 bid, plavix, lexapro, ensure bid, losartan 50, lopressor 12.5 bid, protonix, sevelamer 2 ac, prns/ vits/ supps ?  ?CXR - chronic vasc congestion  ?  ?  ? OP HD: MWF [place] ? 4h  350/600  45kg   2/2 bath  P2  AVG RUA  Hep none ?- last HD 5/12, 48.5 > 46.2kg ?- last Hb 10.8 on 5/08 ?- mircera 50 q4, last 4/21, due 5/19 ?- hectorol 2 ug IV tiw ?- sensipar 30 tiw po ?  ?  ?Assessment/ Plan: ?R arm draining wound - 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 Per VVS okay to stick above the wound. ?Staph sepsis - +blood cx's, ID consulting. Getting IV abx.  ?ESRD - on HD MWF.  Had HD yest off schedule, plan HD again today hopefully. We were able to use AVG yest w/o difficulty.  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 Cont IV vdra and po sensipar and binders.  ?HTN/ volume - hypotensive on levo gtt, alert. Holding home BP meds. Lower vol w/ HD today as is possible.  ? ? ? ? ?Rob Doctor, hospital ?04/29/2022, 10:46 AM ? ? ?Recent Labs  ?Lab 04/27/22 ?1105 04/27/22 ?1352 04/27/22 ?1958 04/28/22 ?0410 04/29/22 ?0449  ?HGB 11.9*   < > 10.9* 10.3* 8.7*  ?ALBUMIN 2.9*  --  3.1*  --   --   ?CALCIUM 8.9  --  8.3* 8.6*  --   ?CREATININE 6.36*  --  6.40* 6.73*  --   ?K 5.0   < > 4.8 4.7  --   ? < > = values in this interval not  displayed.  ? ? ?Inpatient medications: ? Chlorhexidine Gluconate Cloth  6 each Topical Q0600  ? clopidogrel  75 mg Oral Daily  ? heparin  5,000 Units Subcutaneous Q8H  ? mouth rinse  15 mL Mouth Rinse BID  ? multivitamin  1 tablet Oral QHS  ? mupirocin ointment  1 application. Nasal BID  ? sodium chloride flush  10-40 mL Intracatheter Q12H  ? ? sodium chloride 10 mL/hr at 04/28/22 2000  ?  ceFAZolin (ANCEF) IV 1 g (04/28/22 2140)  ? norepinephrine (LEVOPHED) Adult infusion 4 mcg/min (04/29/22 0239)  ? ?sodium chloride, acetaminophen **OR** acetaminophen, albuterol, fentaNYL (SUBLIMAZE) injection, sodium chloride flush ? ? ? ? ? ? ?

## 2022-04-29 NOTE — Evaluation (Signed)
Physical Therapy Evaluation ?Patient Details ?Name: [name] ?MRN: [id number] ?DOB: 03-Oct-1951 ?Today's Date: 04/29/2022 ? ?History of Present Illness ? Pt is a 71 y.o. 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 S/p RUE AVG excision with revision 5/15. Course complicated by hypotension perioperatively; ETT 5/15-5/16. Workup for septic shock secondary to MRSA bacteremia. PMH includes ESRD (HD MWF), CKD, CHF, HTN, HLD. ?  ?Clinical Impression ? Pt presents with condition above and deficits mentioned below, see PT Problem List. Pt is a questionable historian, but majority of information obtained about PLOF/home set-up is consistent with prior entries. Pt reports she was living with her mother, who has dementia and cannot assist the pt, in a 1-level house with a ramped entrance. Pt's sister assists the mother, but pt has limited assistance available from the sister, pt's son, and an aide that comes MWF to prepare pt for HD. At baseline, pt reports being mod I for short household distances with her rollator, denying any recent falls. Currently, pt is requiring mod-maxA for bed mobility and maxA for multiple sit <> stand transfers. She was unable to take a step today either. Pt demonstrating gross generalized weakness, balance deficits, and decreased activity tolerance that place her at high risk for falls. Secondary to her need for extensive physical assistance, significant functional decline from her PLOF, and limited support available at home, recommending pt receive rehab at a SNF. Pt reports interest in a long-term nursing care facility. Will continue to follow acutely.   ?   ? ?Recommendations for follow up therapy are one component of a multi-disciplinary discharge planning process, led by the attending physician.  Recommendations may be updated based on patient status, additional functional criteria and insurance authorization. ? ?Follow Up  Recommendations Skilled nursing-short term rehab (<3 hours/day) ? ?  ?Assistance Recommended at Discharge Frequent or constant Supervision/Assistance  ?Patient can return home with the following ? Two people to help with walking and/or transfers;A lot of help with bathing/dressing/bathroom;Assistance with cooking/housework;Direct supervision/assist for medications management;Direct supervision/assist for financial management;Assist for transportation;Help with stairs or ramp for entrance ? ?  ?Equipment Recommendations None recommended by PT  ?Recommendations for Other Services ? OT consult  ?  ?Functional Status Assessment Patient has had a recent decline in their functional status and demonstrates the ability to make significant improvements in function in a reasonable and predictable amount of time.  ? ?  ?Precautions / Restrictions Precautions ?Precautions: Fall ?Restrictions ?Weight Bearing Restrictions: No  ? ?  ? ?Mobility ? Bed Mobility ?Overal bed mobility: Needs Assistance ?Bed Mobility: Supine to Sit, Sit to Supine, Rolling ?Rolling: Mod assist ?  ?Supine to sit: Mod assist, HOB elevated ?Sit to supine: Max assist ?  ?General bed mobility comments: Pt needing tactile cues and assistance to initiate all tasks. ModA to roll with UE support to pull each direction for pericare. ModA to manage legs off EOB and ascend trunk with supine > sit. MaxA to manage trunk and legs to supine. ?  ? ?Transfers ?Overall transfer level: Needs assistance ?Equipment used: Rolling walker (2 wheels), 1 person hand held assist ?Transfers: Sit to/from Stand ?Sit to Stand: Max assist ?  ?  ?  ?  ?  ?General transfer comment: Pt with poor initiation when cued, needing maxA to initiate sit <> stand from EOB > 3x with UEs on PT or RW. Pt with flexed knees and trunk, needing verbal and tactile cues to correct with extra time ?  ? ?  Ambulation/Gait ?  ?  ?  ?  ?  ?  ?  ?General Gait Details: attempted, but pt unable to initiate x1 step  even with maxA to shift weight and maintain upright posture ? ?Stairs ?  ?  ?  ?  ?  ? ?Wheelchair Mobility ?  ? ?Modified Rankin (Stroke Patients Only) ?  ? ?  ? ?Balance Overall balance assessment: Needs assistance ?Sitting-balance support: Bilateral upper extremity supported, Feet supported ?Sitting balance-Leahy Scale: Poor ?Sitting balance - Comments: Pt with posterior lean, needing min-modA to sit statically EOB ?Postural control: Posterior lean ?Standing balance support: Bilateral upper extremity supported, Reliant on assistive device for balance ?Standing balance-Leahy Scale: Poor ?Standing balance comment: Mod-maxA and bil UE support to stand >3x at EOB, cuing pt to extend knees and trunk with momentary success. ?  ?  ?  ?  ?  ?  ?  ?  ?  ?  ?  ?   ? ? ? ?Pertinent Vitals/Pain Pain Assessment ?Pain Assessment: Faces ?Faces Pain Scale: Hurts a little bit ?Pain Location: generalized with mobility ?Pain Descriptors / Indicators: Grimacing ?Pain Intervention(s): Limited activity within patient's tolerance, Monitored during session, Repositioned  ? ? ?Home Living Family/patient expects to be discharged to:: Private residence ?Living Arrangements: Parent (mother who has dementia) ?Available Help at Discharge: Family;Available PRN/intermittently;Personal care attendant ?Type of Home: House ?Home Access: Ramped entrance ?  ?  ?  ?Home Layout: One level ?Home Equipment: Rollator (4 wheels);Shower seat;Cane - single point;Grab bars - toilet;Grab bars - tub/shower;BSC/3in1;Wheelchair - manual;Rolling Walker (2 wheels) ?Additional Comments: Aide comes MWF to get pt cleaned and ready and out for SCAT to pick up for HD sessions  ?  ?Prior Function Prior Level of Function : Patient poor historian/Family not available;Needs assist ?  ?  ?  ?  ?  ?  ?Mobility Comments: Pt reports short household mobility with Rollator use, intermittently SPC, denies any recent falls. Has been using w/c more recently per pt ?ADLs Comments:  Aide comes MWF to get pt cleaned and ready for SCAT to HD. Pt reports she has meals on wheels. ?  ? ? ?Hand Dominance  ?   ? ?  ?Extremity/Trunk Assessment  ? Upper Extremity Assessment ?Upper Extremity Assessment: Defer to OT evaluation ?  ? ?Lower Extremity Assessment ?Lower Extremity Assessment: Generalized weakness ?  ? ?Cervical / Trunk Assessment ?Cervical / Trunk Assessment: Kyphotic  ?Communication  ? Communication: No difficulties  ?Cognition Arousal/Alertness: Awake/alert ?Behavior During Therapy: [facility] for tasks assessed/performed ?Overall Cognitive Status: No family/caregiver present to determine baseline cognitive functioning ?  ?  ?  ?  ?  ?  ?  ?  ?  ?  ?  ?  ?  ?  ?  ?  ?General Comments: Pt with hx of cognitive deficits with prior PT Eval with prior hospitalization November 2022. Pt appears oriented but perseverates on her bowel movement (aware she had one but unaware of subsequent incontinence during session) or her son. Needs redirecting and cues to maintain her safety. memory deficits noted, seeming to have forgotten repeated cues to direct her to sit EOB ?  ?  ? ?  ?General Comments General comments (skin integrity, edema, etc.): VSS on RA ? ?  ?Exercises    ? ?Assessment/Plan  ?  ?PT Assessment Patient needs continued PT services  ?PT Problem List Decreased strength;Decreased range of motion;Decreased activity tolerance;Decreased balance;Decreased mobility;Decreased coordination;Decreased cognition;Decreased safety awareness;Cardiopulmonary status limiting activity ? ?   ?  ?  PT Treatment Interventions DME instruction;Gait training;Functional mobility training;Therapeutic activities;Therapeutic exercise;Balance training;Neuromuscular re-education;Cognitive remediation;Patient/family education   ? ?PT Goals (Current goals can be found in the Care Plan section)  ?Acute Rehab PT Goals ?Patient Stated Goal: to go into a nursing home ?PT Goal Formulation: With patient ?Time For Goal Achievement:  05/13/22 ?Potential to Achieve Goals: Fair ? ?  ?Frequency Min 2X/week ?  ? ? ?Co-evaluation   ?  ?  ?  ?  ? ? ?  ?AM-PAC PT "6 Clicks" Mobility  ?Outcome Measure Help needed turning from your back to your side while in a

## 2022-04-29 NOTE — Progress Notes (Signed)
Pt receives out-pt HD at [facility] on MWF. Pt arrives at 11:40 for 11:55 chair time. Will assist as needed.   Kaleeah Gingerich Renal Navigator [phone] 

## 2022-04-29 NOTE — Progress Notes (Signed)
? ?NAME:  [name], MRN:  [id number], DOB:  December 04, 1951, LOS: 3 ?ADMISSION DATE:  04/26/2022, CONSULTATION DATE:  04/29/22 ?REFERRING MD:  Vascular, CHIEF COMPLAINT:  Hypotension  ? ?History of Present Illness:  ?[name] is a 71 y.o. F with PMH significant for ESRD on HD, CHF, CAD, HL, HTN who presented to the ED on 5/14 with purulent drainage from the AV fistula on the RUE.  She reported a "bug bite" on the site.   She was admitted to the hospitalist service and started on Cefepime and Vancomycin and taken to the OR for I&D on 5/15.  Peri-operatively pt's blood pressure dropped and she was started on Levophed and PCCM consulted  ? ?Pertinent  Medical History  ? has a past medical history of Acute ischemic stroke ([place]) (02/23/2017), Acute on chronic diastolic CHF (congestive heart failure) ([place]), Adenomatous polyp of colon (10/2010, 2006, 2015), Anemia in CKD (chronic kidney disease) (11/07/2012), Arthritis, CAD (coronary artery disease), CHF (congestive heart failure) ([place]), Constipation, Critical limb ischemia of left lower extremity with ulceration of foot ([place]) (03/17/2022), Depression with anxiety, Diverticula, colon, Diverticulitis of colon with perforation (08/06/2021), ESRD (end stage renal disease) ([place]) (11/07/2012), GERD (gastroesophageal reflux disease), GI bleed (2017), Headache, Hyperlipidemia, Hypertension, Neurologic gait dysfunction, Neuromuscular disorder ([place]), NSTEMI (non-ST elevated myocardial infarction) ([place]) (05/14/2020), Osteoporosis, Pneumonia, Pseudoaneurysm of surgical AV fistula ([place]), Pulmonary edema (12/2019), Stroke ([place]) (11/2015), TIA (transient ischemic attack) (02/21/2021), and Weight loss, unintentional. ? ? ?Significant Hospital Events: ?Including procedures, antibiotic start and stop dates in addition to other pertinent events   ?5/14 admitted with RUE AV graft infection, started on Vanc/Cefepime ?5/15 Taken to OR for graft revision, hypotensive and started on Levophed,  PCCM consulted, intubated ?5/16 Extubated, on low dose levophed ? ?Interim History / Subjective:  ?On 4 mcg levo. MAP 66, but SBP 120s. DBP low.  ?No complaints ?Sleepy: but supposedly is a "night owl" and was up all night.  ? ?Objective   ?Blood pressure (!) 109/45, pulse 67, temperature 99 ?F (37.2 ?C), temperature source Oral, resp. rate 12, height '5\' 2"'$  (1.575 m), weight 49.6 kg, SpO2 100 %. ?   ?Vent Mode: CPAP;PSV ?FiO2 (%):  [40 %] 40 % ?PEEP:  [5 cmH20] 5 cmH20 ?Pressure Support:  [10 cmH20] 10 cmH20  ? ?Intake/Output Summary (Last 24 hours) at 04/29/2022 5732 ?Last data filed at 04/29/2022 0600 ?Gross per 24 hour  ?Intake 516.29 ml  ?Output 1195 ml  ?Net -678.71 ml  ? ? ?Filed Weights  ? 04/28/22 0500 04/28/22 1516 04/28/22 1835  ?Weight: 45.8 kg 51 kg 49.6 kg  ? ?General:  elderly female in NAD ?HEENT: [place]/AT, PERRL ?Neuro: Sleepy but easily arouses to verbal, following commands.  ?CV: RRR no MRG  LUE thrill + ?PULM:  Clear bilateral breath sounds ?GI: soft, bsx4 active  ?Extremities: warm/dry. No drainage from RUE fistula. Sutures in place.  ?Skin: Grossly intact.  ? ?Echo with LVEF 50-55%. Mod HK of the LV, entire inferolateral wall. No obvious vegetation. RV, RA moderately dilated. Tricuspid regurgitation moderate to severe. Mild to mod MR. ? ?Resolved Hospital Problem list   ? ? ?Assessment & Plan:  ?Shock, septic vs vasoplegic in the setting of AV graft infection, staph bacteremia and anesthesia for graft excision and revision. ?MSSA bacteremia: TTE with no clear vegetation. Although there is TR and MR.  ?- wean levo for SBP goal 90 mmHg and unchanged clinical status.  ?- can use midodrine if necessary ?- Cefazolin per ID.  ?-  If rpt cultures clear quickly can possibly defer TEE. Defer to ID.  ?- Needs sutures out in 2 weeks. F/u arranged by VVS who are signing off today.  ? ?Acute respiratory failure with hypoxia ?- extubated successfully to [place] ?- DC supplemental oxygen.  ? ?End-stage renal disease ?-  Appreciate nephrology. For HD today. ?- OK to use new fistula on left forearm per VVS ? ?Anemia of chronic renal disease ?- Monitor CBC ? ?Hypertension, diastolic CHF ?- Hold outpatient medications for now ? ?Best Practice (right click and "Reselect all SmartList Selections" daily)  ? ?Diet/type: Regular consistency (see orders) ?DVT prophylaxis: prophylactic heparin  ?GI prophylaxis: PPI ?Lines: Central line ?Foley:  removal ordered  ?Code Status:  full code ?Last date of multidisciplinary goals of care discussion '[]'$   ? ?Critical care time:  38 minutes  ? ? ?Georgann Housekeeper, AGACNP-BC ?[facility] ? ?See Amion for personal pager ?PCCM on call pager [phone] until 7pm. ?Please call Elink 7p-7a. [phone] ? ?04/29/2022 9:23 AM ? ? ? ? ? ? ?

## 2022-04-29 NOTE — TOC Initial Note (Addendum)
Transition of Care (TOC) - Initial/Assessment Note  ? ? ?Patient Details  ?Name: [name] ?MRN: [id number] ?Date of Birth: [birth year] ? ?Transition of Care (TOC) CM/SW Contact:    ?Milas Gain, LCSWA ?Phone Number: ?04/29/2022, 5:28 PM ? ?Clinical Narrative:                 ? ?CSW received consult for possible SNF placement at time of discharge. CSW spoke with patient at bedside regarding PT recommendation of SNF placement at time of discharge. Patient reports she comes from home with her mother.Patient expressed understanding of PT recommendation and is agreeable to SNF placement at time of discharge. Patient gave CSW permission to fax out initial referral near the [place] area. CSW discussed insurance authorization process with patient. Patient has received the COVID vaccines. Patient reports she receives HD at [facility] on MWF. Pt arrives at 11:40 for 11:55 chair time.Patient expressed being hopeful for rehab and to feel better soon. No further questions reported at this time. CSW to continue to follow and assist with discharge planning needs.  ? ?Expected Discharge Plan: [place] ?Barriers to Discharge: Continued Medical Work up ? ? ?Patient Goals and CMS Choice ?Patient states their goals for this hospitalization and ongoing recovery are:: SNF ?CMS Medicare.gov Compare Post Acute Care list provided to:: Patient ?Choice offered to / list presented to : Patient ? ?Expected Discharge Plan and Services ?Expected Discharge Plan: [place] ?In-house Referral: Clinical Social Work ?  ?  ?  ?                ?  ?  ?  ?  ?  ?  ?  ?  ?  ?  ? ?Prior Living Arrangements/Services ?  ?Lives with:: Parents (Lives with mother) ?Patient language and need for interpreter reviewed:: Yes ?Do you feel safe going back to the place where you live?: No   SNF  ?Need for Family Participation in Patient Care: Yes (Comment) ?Care giver support system in place?: Yes (comment) ?  ?Criminal  Activity/Legal Involvement Pertinent to Current Situation/Hospitalization: No - Comment as needed ? ?Activities of Daily Living ?Home Assistive Devices/Equipment: None ?ADL Screening (condition at time of admission) ?Patient's cognitive ability adequate to safely complete daily activities?: Yes ?Is the patient deaf or have difficulty hearing?: No ?Does the patient have difficulty seeing, even when wearing glasses/contacts?: No ?Does the patient have difficulty concentrating, remembering, or making decisions?: No ?Patient able to express need for assistance with ADLs?: Yes ?Does the patient have difficulty dressing or bathing?: Yes ?Independently performs ADLs?: No ?Does the patient have difficulty walking or climbing stairs?: Yes ?Weakness of Legs: Both ?Weakness of Arms/Hands: None ? ?Permission Sought/Granted ?Permission sought to share information with : Case Manager, Family Supports, Customer service manager ?Permission granted to share information with : Yes, Verbal Permission Granted ? Share Information with NAME: Quillian Quince ? Permission granted to share info w AGENCY: SNF ? Permission granted to share info w Relationship: son ? Permission granted to share info w Contact Information: Quillian Quince [phone] ? ?Emotional Assessment ?Appearance:: Appears stated age ?Attitude/Demeanor/Rapport: Gracious ?Affect (typically observed): Calm ?Orientation: : Oriented to Self, Oriented to Place, Oriented to  Time, Oriented to Situation ?Alcohol / Substance Use: Not Applicable ?Psych Involvement: No (comment) ? ?Admission diagnosis:  Hypoxia [R09.02] ?Acute respiratory failure with hypoxia ([place]) [J96.01] ?Sepsis, due to unspecified organism, unspecified whether acute organ dysfunction present ([place]) [A41.9] ?Patient Active Problem List  ? Diagnosis Date Noted  ?  Sepsis ([place])   ? MSSA bacteremia   ? 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[place]) 04/26/2022  ? Arteriovenous fistula, acquired ([place]) 02/19/2022  ? Cyclical vomiting syndrome  02/19/2022  ? Dysphagia 02/19/2022  ? Anemia of chronic renal failure 02/19/2022  ? 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 Mild cognitive impairment 02/19/2022  ? Major depression, single episode 02/19/2022  ? Chronic diastolic CHF (congestive heart failure) ([place]) 01/23/2022  ? Fall at home, initial encounter 01/23/2022  ? Acute on chronic diastolic (congestive) heart failure ([place]) 11/08/2021  ? GERD (gastroesophageal reflux disease)   ? Other disorders of calcium metabolism 10/17/2021  ? Protein-calorie malnutrition, severe 08/12/2021  ? Acute respiratory failure with hypoxia ([place]) 08/06/2021  ? Major depressive disorder, recurrent episode, moderate ([place]) 02/23/2021  ? Prolonged QT interval 02/21/2021  ? Allergy, unspecified, initial encounter 08/08/2020  ? Ventricular tachycardia ([place]) 03/15/2020  ? Seizure ([place]) 03/15/2020  ? Complication of vascular access for dialysis 12/21/2019  ? Breakdown (mechanical) of surgically created arteriovenous fistula, initial encounter ([place]) 12/18/2019  ? Weakness 10/11/2019  ? 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 Dependence on renal dialysis ([place]) 07/24/2019  ? Iron deficiency anemia, unspecified 06/09/2019  ? Age-related osteoporosis without current pathological fracture 04/17/2019  ? 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  ? Coagulation defect, unspecified ([place]) 04/17/2019  ? 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 Primary generalized (osteo)arthritis 04/17/2019  ? Pure hypercholesterolemia, unspecified 04/17/2019  ? Secondary hyperparathyroidism of renal origin ([place]) 04/17/2019  ? Gastro-esophageal reflux disease without esophagitis 04/17/2019  ? Essential (primary) hypertension 04/17/2019  ? Hyperlipidemia, unspecified 04/17/2019  ? Transient cerebral ischemic attack, unspecified 04/17/2019  ? Prolonged Q-T interval on ECG  02/24/2017  ? Malnutrition of moderate degree 12/24/2016  ? Problem with dialysis access ([place]) 12/21/2016  ? Neurologic abnormality 11/19/2015  ? Anxiety 11/19/2015  ? Insomnia 11/19/2015  ? Gait instability   ? Dizziness 05/09/2015  ? Ataxia 05/09/2015  ? H/O: CVA (cerebrovascular accident) 05/09/2015  ? Left facial numbness 05/09/2015  ? Left leg numbness 05/09/2015  ? Hyperlipidemia   ? SOB (shortness of breath) 04/01/2013  ? HTN (hypertension) 04/01/2013  ? End-stage renal disease on hemodialysis ([place]) 11/07/2012  ? Dyspnea 12/31/2011  ? ?PCP:  [name], MD ?Pharmacy:   ?[place], [place] ?[place] [address] ?Phone: [phone] Fax: [phone] ? ?[place], [address] ?[place] ?[place] [address] ?Phone: [phone] Fax: [phone] ? ?[facility] - [name], [place] - [address] ?[address] ?[address], [address] ?[place] [address] ?Phone: [phone] Fax: [phone] ? ? ? ? ?Social Determinants of Health (SDOH) Interventions ?  ? ?Readmission Risk Interventions ? ?  11/11/2021  ?  3:49 PM 08/18/2021  ?  3:01 PM  ?Readmission Risk Prevention Plan  ?Transportation Screening Complete Complete  ?Medication Review Press photographer) Complete Complete  ?PCP or Specialist appointment within 3-5 days of discharge Complete Complete  ?[place] or Home Care Consult Complete Complete  ?SW Recovery Care/Counseling Consult Complete Complete  ?Palliative Care Screening Not Applicable Not Applicable  ?Skilled Nursing Facility Complete Complete  ? ? ? ?

## 2022-04-30 DIAGNOSIS — B9561 Methicillin susceptible Staphylococcus aureus infection as the cause of diseases classified elsewhere: Secondary | ICD-10-CM | POA: Diagnosis not present

## 2022-04-30 DIAGNOSIS — T827XXD Infection and inflammatory reaction due to other cardiac and vascular devices, implants and grafts, subsequent encounter: Secondary | ICD-10-CM | POA: Diagnosis not present

## 2022-04-30 DIAGNOSIS — R6521 Severe sepsis with septic shock: Secondary | ICD-10-CM

## 2022-04-30 DIAGNOSIS — A4101 Sepsis due to Methicillin susceptible Staphylococcus aureus: Secondary | ICD-10-CM | POA: Diagnosis not present

## 2022-04-30 DIAGNOSIS — J9601 Acute respiratory failure with hypoxia: Secondary | ICD-10-CM | POA: Diagnosis not present

## 2022-04-30 DIAGNOSIS — R7881 Bacteremia: Secondary | ICD-10-CM

## 2022-04-30 DIAGNOSIS — N186 End stage renal disease: Secondary | ICD-10-CM | POA: Diagnosis not present

## 2022-04-30 DIAGNOSIS — N189 Chronic kidney disease, unspecified: Secondary | ICD-10-CM

## 2022-04-30 MED ORDER — CEFAZOLIN SODIUM-DEXTROSE 2-4 GM/100ML-% IV SOLN
2.0000 g | INTRAVENOUS | Status: DC
  Administered 2022-05-01: 2 g via INTRAVENOUS
  Filled 2022-04-30 (×3): qty 100

## 2022-04-30 MED ORDER — MIDODRINE HCL 5 MG PO TABS
5.0000 mg | ORAL_TABLET | Freq: Three times a day (TID) | ORAL | Status: DC
  Administered 2022-04-30 – 2022-05-04 (×13): 5 mg via ORAL
  Filled 2022-04-30 (×12): qty 1

## 2022-04-30 MED ORDER — GERHARDT'S BUTT CREAM: TOPICAL_CREAM | CUTANEOUS | Status: DC | PRN

## 2022-04-30 NOTE — Progress Notes (Signed)
VAST consulted to obtain IV access in order to discontinue central line. Patient with a current fistula in her right arm and old fistula in her left arm. Assessed left arm utilizing ultrasound; no appropriate vessels visualized for PIV placement. RN notified.

## 2022-04-30 NOTE — Progress Notes (Signed)
PHARMACY CONSULT NOTE FOR:  Outpatient IV Therapy Plan   Indication: MSSA bacteremia/AV graft infection  Regimen: Cefazolin 2 gm IV Q MWF w/HD  End date: 05/26/22  No formal OPAT will be done for HD patients. Cefazolin will be administered at HD center.   Thank you for allowing pharmacy to be a part of this patient's care.  Jimmy Footman, PharmD, BCPS, BCIDP Infectious Diseases Clinical Pharmacist Phone: [phone] 04/30/2022, 3:58 PM

## 2022-04-30 NOTE — Evaluation (Signed)
Occupational Therapy Evaluation Patient Details Name: [name] MRN: [id number] DOB: [birth year] Today's Date: 04/30/2022   History of Present Illness Pt is a 71 y.o. 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 S/p RUE AVG excision with revision 5/15. Course complicated by hypotension perioperatively; ETT 5/15-5/16. Workup for septic shock secondary to MRSA bacteremia. PMH includes ESRD (HD MWF), CKD, CHF, HTN, HLD.   Clinical Impression   PTA patient reports using rollator for mobility in home, having assist from aide for bathing/dressing on MWF but able to complete on her own on the opposite days.  She reports using meals on wheels and not completing any other meal prep, [facility] provides transportation.  Pt was admitted for above and is limited by decreased activity tolerance, generalized weakness, impaired balance, and impaired cognition.  Pt is oriented, follows simple commands with increased time but demonstrates poor awareness, safety, recall and problem solving.  She requires min-total assist for ADLs, mod assist for bed mobility, and min-min guard assist for sitting EOB.  Patient will benefit from further OT services while admitted and after dc at SNF level to optimize independence, safety and tolerance for ADLs and mobility. Will follow acutely.      Recommendations for follow up therapy are one component of a multi-disciplinary discharge planning process, led by the attending physician.  Recommendations may be updated based on patient status, additional functional criteria and insurance authorization.   Follow Up Recommendations  Skilled nursing-short term rehab (<3 hours/day)    Assistance Recommended at Discharge Frequent or constant Supervision/Assistance  Patient can return home with the following Two people to help with walking and/or transfers;A lot of help with bathing/dressing/bathroom;Assistance with  cooking/housework;Direct supervision/assist for medications management;Direct supervision/assist for financial management;Assist for transportation;Help with stairs or ramp for entrance    Functional Status Assessment  Patient has had a recent decline in their functional status and demonstrates the ability to make significant improvements in function in a reasonable and predictable amount of time.  Equipment Recommendations  Other (comment) (defer)    Recommendations for Other Services       Precautions / Restrictions Precautions Precautions: Fall Restrictions Weight Bearing Restrictions: No      Mobility Bed Mobility Overal bed mobility: Needs Assistance Bed Mobility: Rolling, Sidelying to Sit, Sit to Sidelying Rolling: Mod assist Sidelying to sit: Mod assist, HOB elevated     Sit to sidelying: Mod assist General bed mobility comments: cueing for technique and sequencing, mod assist for trunk support to ascend and increased time    Transfers                          Balance Overall balance assessment: Needs assistance Sitting-balance support: Bilateral upper extremity supported, Feet supported Sitting balance-Leahy Scale: Poor Sitting balance - Comments: preference to R lateral lean at EOB, cueing to find center.  At best min guard but briefly due to incontinece                                   ADL either performed or assessed with clinical judgement   ADL Overall ADL's : Needs assistance/impaired     Grooming: Min guard;Sitting           Upper Body Dressing : Moderate assistance;Bed level Upper Body Dressing Details (indicate cue type and reason): change gown after soiled bed Lower Body Dressing:  Total assistance;Bed level;Sitting/lateral leans Lower Body Dressing Details (indicate cue type and reason): require assist for socks   Toilet Transfer Details (indicate cue type and reason): unable Toileting- Clothing Manipulation and  Hygiene: Total assistance;+2 for physical assistance;+2 for safety/equipment;Bed level Toileting - Clothing Manipulation Details (indicate cue type and reason): unaware of soiled in BM, 2nd bowel movement incontinent during session but aware.  Requires total assist to clean up     Functional mobility during ADLs: Maximal assistance;Cueing for safety;Cueing for sequencing       Vision   Vision Assessment?: No apparent visual deficits     Perception     Praxis      Pertinent Vitals/Pain Pain Assessment Pain Assessment: Faces Faces Pain Scale: No hurt Pain Intervention(s): Monitored during session     Hand Dominance Right   Extremity/Trunk Assessment Upper Extremity Assessment Upper Extremity Assessment: Generalized weakness   Lower Extremity Assessment Lower Extremity Assessment: Defer to PT evaluation   Cervical / Trunk Assessment Cervical / Trunk Assessment: Kyphotic   Communication Communication Communication: No difficulties   Cognition Arousal/Alertness: Awake/alert Behavior During Therapy: WFL for tasks assessed/performed Overall Cognitive Status: No family/caregiver present to determine baseline cognitive functioning                                 General Comments: pt follows 1 step commands and is oriented.  She has poor awareness, problem solving and recall during session.     General Comments  VSS on RA    Exercises     Shoulder Instructions      Home Living Family/patient expects to be discharged to:: Private residence Living Arrangements: Parent (mother who has dementia) Available Help at Discharge: Family;Available PRN/intermittently;Personal care attendant Type of Home: House Home Access: Ramped entrance     Home Layout: One level     Bathroom Shower/Tub: Sponge bathes at baseline   Bathroom Toilet: [place]: Rollator (4 wheels);Shower seat;Cane - single point;Grab bars - toilet;Grab bars -  tub/shower;BSC/3in1;Wheelchair - Publishing copy (2 wheels)   Additional Comments: Aide comes MWF to get pt cleaned and ready and out for [facility] to pick up for HD sessions      Prior Functioning/Environment Prior Level of Function : Patient poor historian/Family not available;Needs assist             Mobility Comments: Pt reports short household mobility with Rollator use, intermittently SPC, denies any recent falls. Has been using w/c more recently per pt ADLs Comments: reports aide assists with bathing/dressing MWF, but able to basin bathe/dress/toilet on her own on the other days.  Uses meals on wheels.        OT Problem List: Decreased strength;Impaired balance (sitting and/or standing);Decreased activity tolerance;Decreased coordination;Decreased cognition;Decreased safety awareness;Decreased knowledge of use of DME or AE;Decreased knowledge of precautions      OT Treatment/Interventions: Self-care/ADL training;Therapeutic exercise;DME and/or AE instruction;Therapeutic activities;Patient/family education;Balance training    OT Goals(Current goals can be found in the care plan section) Acute Rehab OT Goals Patient Stated Goal: get stronger OT Goal Formulation: With patient Time For Goal Achievement: 05/14/22 Potential to Achieve Goals: Fair  OT Frequency: Min 2X/week    Co-evaluation              AM-PAC OT "6 Clicks" Daily Activity     Outcome Measure Help from another person eating meals?: A Little Help from another person taking care  of personal grooming?: A Little Help from another person toileting, which includes using toliet, bedpan, or urinal?: Total Help from another person bathing (including washing, rinsing, drying)?: A Lot Help from another person to put on and taking off regular upper body clothing?: A Lot Help from another person to put on and taking off regular lower body clothing?: Total 6 Click Score: 12   End of Session Equipment Utilized During  Treatment: Oxygen Nurse Communication: Mobility status  Activity Tolerance: Patient tolerated treatment well Patient left: in bed;with call bell/phone within reach;with bed alarm set  OT Visit Diagnosis: Other abnormalities of gait and mobility (R26.89);Muscle weakness (generalized) (M62.81);Other symptoms and signs involving cognitive function                Time: 1018-1040 OT Time Calculation (min): 22 min Charges:  OT General Charges $OT Visit: 1 Visit OT Evaluation $OT Eval Moderate Complexity: 1 Mod  Jolaine Artist, OT Acute Rehabilitation Services Pager [phone] Office [phone]   Delight Stare 04/30/2022, 10:51 AM

## 2022-04-30 NOTE — Progress Notes (Signed)
PROGRESS NOTE    [name]  NTI:[id number] DOB: 10/04/1951 DOA: 04/26/2022 PCP: [name], MD   Chief Complaint  Patient presents with   Shortness of Breath   Weakness    Brief Narrative:  [name] is a 71 y.o. F with PMH significant for ESRD on HD, CHF, CAD, HL, HTN who presented to the ED on 5/14 with purulent drainage from the AV fistula on the RUE.  She reported a "bug bite" on the site.   She was admitted to the hospitalist service and started on Cefepime and Vancomycin and taken to the OR for I&D on 5/15.  Peri-operatively pt's blood pressure dropped and she was started on Levophed and admitted to ICU.  5/14 admitted with RUE AV graft infection, started on Vanc/Cefepime 5/15 Taken to OR for graft revision, hypotensive and started on Levophed, PCCM consulted, intubated 5/16 Extubated, on low dose levophed 5/18 transferred to [facility] service.    Assessment & Plan:   Principal Problem:   Acute respiratory failure with hypoxia (HCC) Active Problems:   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HCC)   End-stage renal disease on hemodialysis (HCC)   HTN (hypertension)   Chronic diastolic CHF (congestive heart failure) (HCC)   Anemia of chronic renal failure   Sepsis ([place])   MSSA bacteremia   Septic shock s in the setting of AV graft infection, staph bacteremia  -Present on admission -Requiring pressor support initially, currently weaned off pressor support.  -Off Levophed, currently on midodrine, blood pressure is acceptable, continue with current dose  AV graft infection, MSSA bacteremia -Status post AVG revision by VVS on 5/15 (small segment resected due to degeneration and new segment AVG replaced). -Per VVS okay to stick above the wound.   - TTE with no clear vegetation. Although there is TR and MR.  -Antibiotic management per ID, continue with IV Ancef.  Acute respiratory failure with hypoxia - extubated successfully to [place] - DC supplemental oxygen.  -Encouraged  use incentive spirometry and flutter valve   End-stage renal disease - Appreciate nephrology. For HD today. - OK to use new fistula on left forearm per VVS   Anemia of chronic renal disease - Monitor CBC, transfuse as needed.   Hypertension, diastolic CHF -Volume management with dialysis  Difficult IV access -Unable to establish peripheral IV, so we will continue with left IJ for now    DVT prophylaxis: Heparin Code Status: Full Family Communication: None at bedside Disposition:   Status is: Inpatient    Consultants:  PCCM VVS Renal    Subjective: \No significant events overnight as discussed with staff, she denies any complaints.  Objective: Vitals:   04/30/22 0900 04/30/22 1000 04/30/22 1100 04/30/22 1224  BP: (!) 143/67 (!) 124/48 (!) 120/58 (!) 130/58  Pulse: 76 73 73 73  Resp: '12 12 17 18  '$ Temp:    98.1 F (36.7 C)  TempSrc:    Oral  SpO2:    91%  Weight:    53 kg  Height:        Intake/Output Summary (Last 24 hours) at 04/30/2022 1332 Last data filed at 04/30/2022 1000 Gross per 24 hour  Intake 384.94 ml  Output --  Net 384.94 ml   Filed Weights   04/28/22 1835 04/30/22 0500 04/30/22 1224  Weight: 49.6 kg 49.6 kg 53 kg    Examination:  Awake Alert, Oriented X 3, frail, thin appearing, deconditioned Symmetrical Chest wall movement, Good air movement bilaterally, CTAB RRR,No Gallops,Rubs or new Murmurs, No  Parasternal Heave +ve B.Sounds, Abd Soft, No tenderness, No rebound - guarding or rigidity. No Cyanosis, Clubbing or edema, No new Rash or bruise      Data Reviewed: I have personally reviewed following labs and imaging studies  CBC: Recent Labs  Lab 04/26/22 2040 04/26/22 2054 04/27/22 1105 04/27/22 1352 04/27/22 1604 04/27/22 1941 04/27/22 1958 04/28/22 0410 04/29/22 0449  WBC 11.3*  --  9.0  --   --   --  20.3* 16.1* 7.3  NEUTROABS 9.8*  --  6.7  --   --   --   --   --   --   HGB 10.5*   < > 11.9*   < > 11.2* 11.9* 10.9*  10.3* 8.7*  HCT 31.1*   < > 36.6   < > 33.0* 35.0* 33.0* 31.5* 25.9*  MCV 96.9  --  97.3  --   --   --  97.3 97.8 96.6  PLT 184  --  167  --   --   --  189 207 147*   < > = values in this interval not displayed.    Basic Metabolic Panel: Recent Labs  Lab 04/26/22 2040 04/26/22 2054 04/27/22 1105 04/27/22 1352 04/27/22 1604 04/27/22 1941 04/27/22 1958 04/28/22 0410 04/29/22 1618  NA 134* 132* 135   < > 136 134* 135 134* 137  K 4.2 4.2 5.0   < > 3.9 4.7 4.8 4.7 3.4*  CL 95* 95* 94*  --   --   --  96* 97* 99  CO2 26  --  24  --   --   --  21* 22 26  GLUCOSE 125* 125* 80  --   --   --  204* 166* 110*  BUN 39* 35* 44*  --   --   --  46* 50* 30*  CREATININE 5.92* 6.30* 6.36*  --   --   --  6.40* 6.73* 4.76*  CALCIUM 8.9  --  8.9  --   --   --  8.3* 8.6* 8.7*  MG  --   --  2.1  --   --   --  2.2  --   --   PHOS  --   --   --   --   --   --   --   --  3.9   < > = values in this interval not displayed.    GFR: Estimated Creatinine Clearance: 8.6 mL/min (A) (by C-G formula based on SCr of 4.76 mg/dL (H)).  Liver Function Tests: Recent Labs  Lab 04/26/22 2040 04/27/22 1105 04/27/22 1958 04/29/22 1618  AST 27 40 144*  --   ALT 16 20 63*  --   ALKPHOS 121 101 189*  --   BILITOT 1.3* 1.1 1.2  --   PROT 6.8 6.9 6.4*  --   ALBUMIN 2.9* 2.9* 3.1* 2.7*    CBG: Recent Labs  Lab 04/27/22 1557 04/27/22 1619 04/27/22 1942 04/28/22 0156 04/28/22 2003  GLUCAP 65* 120* 179* 189* 114*     Recent Results (from the past 240 hour(s))  Blood Culture (routine x 2)     Status: Abnormal   Collection Time: 04/26/22  8:40 PM   Specimen: BLOOD  Result Value Ref Range Status   Specimen Description BLOOD  Final   Special Requests   Final    RIGHT EJ BOTTLES DRAWN AEROBIC AND ANAEROBIC Blood Culture adequate volume   Culture  Setup Time  Final    GRAM POSITIVE COCCI IN BOTH AEROBIC AND ANAEROBIC BOTTLES CRITICAL RESULT CALLED TO, READ BACK BY AND VERIFIED WITH: PHARMD ALEX 134  [id number] FCP Performed at [facility], [place] [address] [place], [place] [address]    Culture STAPHYLOCOCCUS AUREUS (A)  Final   Report Status 04/29/2022 FINAL  Final   Organism ID, Bacteria STAPHYLOCOCCUS AUREUS  Final      Susceptibility   Staphylococcus aureus - MIC*    CIPROFLOXACIN <=0.5 SENSITIVE Sensitive     ERYTHROMYCIN <=0.25 SENSITIVE Sensitive     GENTAMICIN <=0.5 SENSITIVE Sensitive     OXACILLIN 0.5 SENSITIVE Sensitive     TETRACYCLINE <=1 SENSITIVE Sensitive     VANCOMYCIN 1 SENSITIVE Sensitive     TRIMETH/SULFA <=10 SENSITIVE Sensitive     CLINDAMYCIN <=0.25 SENSITIVE Sensitive     RIFAMPIN <=0.5 SENSITIVE Sensitive     Inducible Clindamycin NEGATIVE Sensitive     * STAPHYLOCOCCUS AUREUS  Blood Culture ID Panel (Reflexed)     Status: Abnormal   Collection Time: 04/26/22  8:40 PM  Result Value Ref Range Status   Enterococcus faecalis NOT DETECTED NOT DETECTED Final   Enterococcus Faecium NOT DETECTED NOT DETECTED Final   Listeria monocytogenes NOT DETECTED NOT DETECTED Final   Staphylococcus species DETECTED (A) NOT DETECTED Final    Comment: CRITICAL RESULT CALLED TO, READ BACK BY AND VERIFIED WITH: PHARMD ALEX L 1234 [id number] FCP    Staphylococcus aureus (BCID) DETECTED (A) NOT DETECTED Final    Comment: CRITICAL RESULT CALLED TO, READ BACK BY AND VERIFIED WITH: PHARMD ALEX L 1234 [id number] FCP    Staphylococcus epidermidis NOT DETECTED NOT DETECTED Final   Staphylococcus lugdunensis NOT DETECTED NOT DETECTED Final   Streptococcus species NOT DETECTED NOT DETECTED Final   Streptococcus agalactiae NOT DETECTED NOT DETECTED Final   Streptococcus pneumoniae NOT DETECTED NOT DETECTED Final   Streptococcus pyogenes NOT DETECTED NOT DETECTED Final   A.calcoaceticus-baumannii NOT DETECTED NOT DETECTED Final   Bacteroides fragilis NOT DETECTED NOT DETECTED Final   Enterobacterales NOT DETECTED NOT DETECTED Final   Enterobacter cloacae complex NOT DETECTED NOT  DETECTED Final   Escherichia coli NOT DETECTED NOT DETECTED Final   Klebsiella aerogenes NOT DETECTED NOT DETECTED Final   Klebsiella oxytoca NOT DETECTED NOT DETECTED Final   Klebsiella pneumoniae NOT DETECTED NOT DETECTED Final   Proteus species NOT DETECTED NOT DETECTED Final   Salmonella species NOT DETECTED NOT DETECTED Final   Serratia marcescens NOT DETECTED NOT DETECTED Final   Haemophilus influenzae NOT DETECTED NOT DETECTED Final   Neisseria meningitidis NOT DETECTED NOT DETECTED Final   Pseudomonas aeruginosa NOT DETECTED NOT DETECTED Final   Stenotrophomonas maltophilia NOT DETECTED NOT DETECTED Final   Candida albicans NOT DETECTED NOT DETECTED Final   Candida auris NOT DETECTED NOT DETECTED Final   Candida glabrata NOT DETECTED NOT DETECTED Final   Candida krusei NOT DETECTED NOT DETECTED Final   Candida parapsilosis NOT DETECTED NOT DETECTED Final   Candida tropicalis NOT DETECTED NOT DETECTED Final   Cryptococcus neoformans/gattii NOT DETECTED NOT DETECTED Final   Meth resistant mecA/C and MREJ NOT DETECTED NOT DETECTED Final    Comment: Performed at [facility], [address] [place], [place] [address]  Resp Panel by RT-PCR (Flu A&B, Covid) Nasopharyngeal Swab     Status: None   Collection Time: 04/26/22  9:35 PM   Specimen: Nasopharyngeal Swab; Nasopharyngeal(NP) swabs in vial transport medium  Result Value Ref  Range Status   SARS Coronavirus 2 by RT PCR NEGATIVE NEGATIVE Final    Comment: (NOTE) SARS-CoV-2 target nucleic acids are NOT DETECTED.  The SARS-CoV-2 RNA is generally detectable in upper respiratory specimens during the acute phase of infection. The lowest concentration of SARS-CoV-2 viral copies this assay can detect is 138 copies/mL. A negative result does not preclude SARS-Cov-2 infection and should not be used as the sole basis for treatment or other patient management decisions. A negative result may occur with  improper specimen  collection/handling, submission of specimen other than nasopharyngeal swab, presence of viral mutation(s) within the areas targeted by this assay, and inadequate number of viral copies(<138 copies/mL). A negative result must be combined with clinical observations, patient history, and epidemiological information. The expected result is Negative.  Fact Sheet for Patients:  EntrepreneurPulse.com.au  Fact Sheet for Healthcare Providers:  IncredibleEmployment.be  This test is no t yet approved or cleared by the [place] FDA and  has been authorized for detection and/or diagnosis of SARS-CoV-2 by FDA under an Emergency Use Authorization (EUA). This EUA will remain  in effect (meaning this test can be used) for the duration of the COVID-19 declaration under Section 564(b)(1) of the Act, 21 U.S.C.section 360bbb-3(b)(1), unless the authorization is terminated  or revoked sooner.       Influenza A by PCR NEGATIVE NEGATIVE Final   Influenza B by PCR NEGATIVE NEGATIVE Final    Comment: (NOTE) The Xpert Xpress SARS-CoV-2/FLU/RSV plus assay is intended as an aid in the diagnosis of influenza from Nasopharyngeal swab specimens and should not be used as a sole basis for treatment. Nasal washings and aspirates are unacceptable for Xpert Xpress SARS-CoV-2/FLU/RSV testing.  Fact Sheet for Patients: EntrepreneurPulse.com.au  Fact Sheet for Healthcare Providers: IncredibleEmployment.be  This test is not yet approved or cleared by the [place] FDA and has been authorized for detection and/or diagnosis of SARS-CoV-2 by FDA under an Emergency Use Authorization (EUA). This EUA will remain in effect (meaning this test can be used) for the duration of the COVID-19 declaration under Section 564(b)(1) of the Act, 21 U.S.C. section 360bbb-3(b)(1), unless the authorization is terminated or revoked.  Performed at [facility], [place] [address] [place], [place] [address]   Surgical pcr screen     Status: Abnormal   Collection Time: 04/27/22 12:19 PM   Specimen: Nasal Mucosa; Nasal Swab  Result Value Ref Range Status   MRSA, PCR POSITIVE (A) NEGATIVE Final    Comment: RESULT CALLED TO, READ BACK BY AND VERIFIED WITH: RN N.GAUNTLETT ON [id number] AT 1425 BY E.PARRISH    Staphylococcus aureus POSITIVE (A) NEGATIVE Final    Comment: (NOTE) The Xpert SA Assay (FDA approved for NASAL specimens in patients 11 years of age and older), is one component of a comprehensive surveillance program. It is not intended to diagnose infection nor to guide or monitor treatment. Performed at [facility], [place] [address] [place], [place] [address]   Aerobic/Anaerobic Culture w Gram Stain (surgical/deep wound)     Status: None (Preliminary result)   Collection Time: 04/27/22  2:47 PM   Specimen: Other Source; Body Fluid  Result Value Ref Range Status   Specimen Description WOUND  Final   Special Requests AV GRAFT PT ON ANCEF  Final   Gram Stain   Final    NO WBC SEEN RARE GRAM POSITIVE COCCI IN PAIRS Performed at [facility], [address], [place], [place] [address]  Culture   Final    RARE STAPHYLOCOCCUS AUREUS NO ANAEROBES ISOLATED; CULTURE IN PROGRESS FOR 5 DAYS    Report Status PENDING  Incomplete   Organism ID, Bacteria STAPHYLOCOCCUS AUREUS  Final      Susceptibility   Staphylococcus aureus - MIC*    CIPROFLOXACIN <=0.5 SENSITIVE Sensitive     ERYTHROMYCIN <=0.25 SENSITIVE Sensitive     GENTAMICIN <=0.5 SENSITIVE Sensitive     OXACILLIN <=0.25 SENSITIVE Sensitive     TETRACYCLINE <=1 SENSITIVE Sensitive     VANCOMYCIN 1 SENSITIVE Sensitive     TRIMETH/SULFA <=10 SENSITIVE Sensitive     CLINDAMYCIN <=0.25 SENSITIVE Sensitive     RIFAMPIN <=0.5 SENSITIVE Sensitive     Inducible Clindamycin NEGATIVE Sensitive     * RARE STAPHYLOCOCCUS AUREUS  Culture, blood (Routine X 2) w Reflex  to ID Panel     Status: None (Preliminary result)   Collection Time: 04/28/22  8:31 AM   Specimen: BLOOD LEFT FOREARM  Result Value Ref Range Status   Specimen Description BLOOD LEFT FOREARM  Final   Special Requests   Final    BOTTLES DRAWN AEROBIC AND ANAEROBIC Blood Culture adequate volume   Culture   Final    NO GROWTH 2 DAYS Performed at [facility], [address], [place], [place] [address]    Report Status PENDING  Incomplete  Culture, blood (Routine X 2) w Reflex to ID Panel     Status: None (Preliminary result)   Collection Time: 04/28/22  8:31 AM   Specimen: BLOOD  Result Value Ref Range Status   Specimen Description BLOOD LEFT ANTECUBITAL  Final   Special Requests IN PEDIATRIC BOTTLE Blood Culture adequate volume  Final   Culture   Final    NO GROWTH 2 DAYS Performed at [facility], [place] [address] [place], [place] [address]    Report Status PENDING  Incomplete         Radiology Studies: No results found.      Scheduled Meds:  Chlorhexidine Gluconate Cloth  6 each Topical Q0600   clopidogrel  75 mg Oral Daily   feeding supplement  237 mL Oral BID BM   heparin  5,000 Units Subcutaneous Q8H   mouth rinse  15 mL Mouth Rinse BID   midodrine  5 mg Oral TID WC   multivitamin  1 tablet Oral QHS   mupirocin ointment  1 application. Nasal BID   sodium chloride flush  10-40 mL Intracatheter Q12H   Continuous Infusions:  sodium chloride Stopped (04/30/22 0857)    ceFAZolin (ANCEF) IV 100 mL/hr at 04/30/22 1000   norepinephrine (LEVOPHED) Adult infusion Stopped (04/30/22 0721)     LOS: 4 days       Phillips Climes, MD [facility]   To contact the attending provider between 7A-7P or the covering provider during after hours 7P-7A, please log into the web site [email or web address] and access using universal [place] password for that web site. If you do not have the password, please call the hospital operator.  04/30/2022, 1:32 PM

## 2022-04-30 NOTE — Progress Notes (Signed)
[facility] Infect[facility]  Date of Admission:  04/26/2022     Abx: 5/15-c cefazolin  ASSESSMENT: 71 yo female esrd on iHD rue avf (also old nonfunctional lue avf) here with RUE graft site infection/mssa bsi  5/16 repeat bcx negative 5/15 graft/tissue cx staph aureus 5/14 bcx mssa  She has recent rle stent without concerning pain/metastatic focus of infection there. She denies localizing pain else where  5/16 tte no concern for valve involvement  If repeat bcx clears quickly could defer tee as plan for duration of abx would be similar to endocarditis course with tail oral chronic suppression in setting of new graft placement in infected area   ----------- 5/18 assessment Clinically well Repeat bcx remains negative  Would treat with 4 weeks iv cefazolin then chronically suppress with daily cephalexin  -cefazolin can be given 2 gram post dialysis tiw on dialysis days -duration of cefazolin 4 weeks from 5/16 to 6/13 -on 6/13 she can start cephalexin 500 mg PO nightly  HD center to do Labs weekly while on IV antibiotics: _x_ CBC with differential __ BMP _x_ CMP _x_ CRP __ ESR __ Vancomycin trough __ CK   Fax weekly labs to [phone]  Clinic Follow Up Appt: 6/06 @ 945 with Dr Gale Journey  @  [facility] [address], [place], 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 Nasal BID   sodium chloride flush  10-40 mL Intracatheter Q12H   Continuous Infusions:  sodium chloride Stopped (04/30/22 0857)    ceFAZolin (ANCEF) IV 100 mL/hr at 04/30/22 1000   PRN Meds:.sodium chloride, acetaminophen **OR** acetaminophen, albuterol, fentaNYL (SUBLIMAZE) injection, Gerhardt's butt cream, sodium chloride flush   SUBJECTIVE: Feels well, in icu awaiting floor bed Afebrile No n/v/diarrhea    Review of Systems: ROS All other ROS was negative, except mentioned above     OBJECTIVE: Vitals:   04/30/22 0900 04/30/22 1000 04/30/22 1100 04/30/22 1224  BP: (!) 143/67 (!) 124/48 (!) 120/58 (!) 130/58  Pulse: 76 73 73 73  Resp: 12 12 17 18   Temp:    98.1 F (36.7 C)  TempSrc:    Oral  SpO2:    91%  Weight:    53 kg  Height:       Body mass index is 21.37 kg/m.  Physical Exam  General/constitutional:  no distress, pleasant HEENT: Normocephalic, PER, Conj Clear, EOMI, Oropharynx clear Neck supple CV: rrr no mrg Lungs: clear to auscultation, normal respiratory effort Abd: Soft, Nontender Ext/skin; rue graft site incision intact no cellulitis change and no purulence; lue old avf site nontender; RLE nontender  Neuro: nonfocal  Lab Results Lab Results  Component Value Date   WBC 7.3 04/29/2022   HGB 8.7 (L) 04/29/2022   HCT 25.9 (L) 04/29/2022   MCV 96.6 04/29/2022   PLT 147 (L) 04/29/2022    Lab Results  Component Value Date   CREATININE 4.76 (H) 04/29/2022   BUN 30 (H) 04/29/2022   NA 137 04/29/2022   K 3.4 (L) 04/29/2022   CL 99  04/29/2022   CO2 26 04/29/2022    Lab Results  Component Value Date   ALT 63 (H) 04/27/2022   AST 144 (H) 04/27/2022   ALKPHOS 189 (H) 04/27/2022   BILITOT 1.2 04/27/2022      Microbiology: Recent Results (from the past 240 hour(s))  Blood Culture (routine x 2)     Status: Abnormal   Collection Time: 04/26/22  8:40 PM   Specimen: BLOOD  Result Value Ref Range Status   Specimen Description BLOOD  Final   Special Requests   Final    RIGHT EJ BOTTLES DRAWN AEROBIC AND ANAEROBIC Blood Culture adequate volume   Culture  Setup Time   Final    GRAM POSITIVE COCCI IN BOTH AEROBIC AND ANAEROBIC BOTTLES CRITICAL RESULT CALLED TO, READ BACK BY AND VERIFIED WITH: PHARMD ALEX [id number] FCP Performed at [facility], [address] [place], [place] [address]    Culture STAPHYLOCOCCUS AUREUS (A)  Final   Report Status 04/29/2022 FINAL  Final   Organism ID, Bacteria STAPHYLOCOCCUS AUREUS  Final      Susceptibility   Staphylococcus aureus - MIC*    CIPROFLOXACIN <=0.5 SENSITIVE Sensitive     ERYTHROMYCIN <=0.25 SENSITIVE Sensitive     GENTAMICIN <=0.5 SENSITIVE Sensitive     OXACILLIN 0.5 SENSITIVE Sensitive     TETRACYCLINE <=1 SENSITIVE Sensitive     VANCOMYCIN 1 SENSITIVE Sensitive     TRIMETH/SULFA <=10 SENSITIVE Sensitive     CLINDAMYCIN <=0.25 SENSITIVE Sensitive     RIFAMPIN <=0.5 SENSITIVE Sensitive     Inducible Clindamycin NEGATIVE Sensitive     * STAPHYLOCOCCUS AUREUS  Blood Culture ID Panel (Reflexed)     Status: Abnormal   Collection Time: 04/26/22  8:40 PM  Result Value Ref Range Status   Enterococcus faecalis NOT DETECTED NOT DETECTED Final   Enterococcus Faecium NOT DETECTED NOT DETECTED Final   Listeria monocytogenes NOT DETECTED NOT DETECTED Final   Staphylococcus species DETECTED (A) NOT DETECTED Final    Comment: CRITICAL RESULT CALLED TO, READ BACK BY AND VERIFIED WITH: PHARMD ALEX L [id number] FCP    Staphylococcus aureus (BCID) DETECTED (A) NOT  DETECTED Final    Comment: CRITICAL RESULT CALLED TO, READ BACK BY AND VERIFIED WITH: PHARMD ALEX L [id number] FCP    Staphylococcus epidermidis NOT DETECTED NOT DETECTED Final   Staphylococcus lugdunensis NOT DETECTED NOT DETECTED Final   Streptococcus species NOT DETECTED NOT DETECTED Final   Streptococcus agalactiae NOT DETECTED NOT DETECTED Final   Streptococcus pneumoniae NOT DETECTED NOT DETECTED Final   Streptococcus pyogenes NOT DETECTED NOT DETECTED Final   A.calcoaceticus-baumannii NOT DETECTED NOT DETECTED Final   Bacteroides fragilis NOT DETECTED NOT DETECTED Final   Enterobacterales NOT DETECTED NOT DETECTED Final  Enterobacter cloacae complex NOT DETECTED NOT DETECTED Final   Escherichia coli NOT DETECTED NOT DETECTED Final   Klebsiella aerogenes NOT DETECTED NOT DETECTED Final   Klebsiella oxytoca NOT DETECTED NOT DETECTED Final   Klebsiella pneumoniae NOT DETECTED NOT DETECTED Final   Proteus species NOT DETECTED NOT DETECTED Final   Salmonella species NOT DETECTED NOT DETECTED Final   Serratia marcescens NOT DETECTED NOT DETECTED Final   Haemophilus influenzae NOT DETECTED NOT DETECTED Final   Neisseria meningitidis NOT DETECTED NOT DETECTED Final   Pseudomonas aeruginosa NOT DETECTED NOT DETECTED Final   Stenotrophomonas maltophilia NOT DETECTED NOT DETECTED Final   Candida albicans NOT DETECTED NOT DETECTED Final   Candida auris NOT DETECTED NOT DETECTED Final   Candida glabrata NOT DETECTED NOT DETECTED Final   Candida krusei NOT DETECTED NOT DETECTED Final   Candida parapsilosis NOT DETECTED NOT DETECTED Final   Candida tropicalis NOT DETECTED NOT DETECTED Final   Cryptococcus neoformans/gattii NOT DETECTED NOT DETECTED Final   Meth resistant mecA/C and MREJ NOT DETECTED NOT DETECTED Final    Comment: Performed at [facility], [place] [address] [place], [place] [address]  Resp Panel by RT-PCR (Flu A&B, Covid) Nasopharyngeal Swab     Status: None    Collection Time: 04/26/22  9:35 PM   Specimen: Nasopharyngeal Swab; Nasopharyngeal(NP) swabs in vial transport medium  Result Value Ref Range Status   SARS Coronavirus 2 by RT PCR NEGATIVE NEGATIVE Final    Comment: (NOTE) SARS-CoV-2 target nucleic acids are NOT DETECTED.  The SARS-CoV-2 RNA is generally detectable in upper respiratory specimens during the acute phase of infection. The lowest concentration of SARS-CoV-2 viral copies this assay can detect is 138 copies/mL. A negative result does not preclude SARS-Cov-2 infection and should not be used as the sole basis for treatment or other patient management decisions. A negative result may occur with  improper specimen collection/handling, submission of specimen other than nasopharyngeal swab, presence of viral mutation(s) within the areas targeted by this assay, and inadequate number of viral copies(<138 copies/mL). A negative result must be combined with clinical observations, patient history, and epidemiological information. The expected result is Negative.  Fact Sheet for Patients:  EntrepreneurPulse.com.au  Fact Sheet for Healthcare Providers:  IncredibleEmployment.be  This test is no t yet approved or cleared by the [place] FDA and  has been authorized for detection and/or diagnosis of SARS-CoV-2 by FDA under an Emergency Use Authorization (EUA). This EUA will remain  in effect (meaning this test can be used) for the duration of the COVID-19 declaration under Section 564(b)(1) of the Act, 21 U.S.C.section 360bbb-3(b)(1), unless the authorization is terminated  or revoked sooner.       Influenza A by PCR NEGATIVE NEGATIVE Final   Influenza B by PCR NEGATIVE NEGATIVE Final    Comment: (NOTE) The Xpert Xpress SARS-CoV-2/FLU/RSV plus assay is intended as an aid in the diagnosis of influenza from Nasopharyngeal swab specimens and should not be used as a sole basis for treatment.  Nasal washings and aspirates are unacceptable for Xpert Xpress SARS-CoV-2/FLU/RSV testing.  Fact Sheet for Patients: EntrepreneurPulse.com.au  Fact Sheet for Healthcare Providers: IncredibleEmployment.be  This test is not yet approved or cleared by the [place] FDA and has been authorized for detection and/or diagnosis of SARS-CoV-2 by FDA under an Emergency Use Authorization (EUA). This EUA will remain in effect (meaning this test can be used) for the duration of the COVID-19 declaration under Section 564(b)(1) of the Act,  21 U.S.C. section 360bbb-3(b)(1), unless the authorization is terminated or revoked.  Performed at [facility], [place] [address] [place], [place] [address]   Surgical pcr screen     Status: Abnormal   Collection Time: 04/27/22 12:19 PM   Specimen: Nasal Mucosa; Nasal Swab  Result Value Ref Range Status   MRSA, PCR POSITIVE (A) NEGATIVE Final    Comment: RESULT CALLED TO, READ BACK BY AND VERIFIED WITH: RN N.GAUNTLETT ON [id number] AT 1425 BY E.PARRISH    Staphylococcus aureus POSITIVE (A) NEGATIVE Final    Comment: (NOTE) The Xpert SA Assay (FDA approved for NASAL specimens in patients 68 years of age and older), is one component of a comprehensive surveillance program. It is not intended to diagnose infection nor to guide or monitor treatment. Performed at [facility], [place] [address] [place], [place] [address]   Aerobic/Anaerobic Culture w Gram Stain (surgical/deep wound)     Status: None (Preliminary result)   Collection Time: 04/27/22  2:47 PM   Specimen: Other Source; Body Fluid  Result Value Ref Range Status   Specimen Description WOUND  Final   Special Requests AV GRAFT PT ON ANCEF  Final   Gram Stain   Final    NO WBC SEEN RARE GRAM POSITIVE COCCI IN PAIRS Performed at [facility], [address], [place], [place] [address]    Culture   Final    RARE STAPHYLOCOCCUS AUREUS NO  ANAEROBES ISOLATED; CULTURE IN PROGRESS FOR 5 DAYS    Report Status PENDING  Incomplete   Organism ID, Bacteria STAPHYLOCOCCUS AUREUS  Final      Susceptibility   Staphylococcus aureus - MIC*    CIPROFLOXACIN <=0.5 SENSITIVE Sensitive     ERYTHROMYCIN <=0.25 SENSITIVE Sensitive     GENTAMICIN <=0.5 SENSITIVE Sensitive     OXACILLIN <=0.25 SENSITIVE Sensitive     TETRACYCLINE <=1 SENSITIVE Sensitive     VANCOMYCIN 1 SENSITIVE Sensitive     TRIMETH/SULFA <=10 SENSITIVE Sensitive     CLINDAMYCIN <=0.25 SENSITIVE Sensitive     RIFAMPIN <=0.5 SENSITIVE Sensitive     Inducible Clindamycin NEGATIVE Sensitive     * RARE STAPHYLOCOCCUS AUREUS  Culture, blood (Routine X 2) w Reflex to ID Panel     Status: None (Preliminary result)   Collection Time: 04/28/22  8:31 AM   Specimen: BLOOD LEFT FOREARM  Result Value Ref Range Status   Specimen Description BLOOD LEFT FOREARM  Final   Special Requests   Final    BOTTLES DRAWN AEROBIC AND ANAEROBIC Blood Culture adequate volume   Culture   Final    NO GROWTH 2 DAYS Performed at [facility], [address] [place], [place] [address]    Report Status PENDING  Incomplete  Culture, blood (Routine X 2) w Reflex to ID Panel     Status: None (Preliminary result)   Collection Time: 04/28/22  8:31 AM   Specimen: BLOOD  Result Value Ref Range Status   Specimen Description BLOOD LEFT ANTECUBITAL  Final   Special Requests IN PEDIATRIC BOTTLE Blood Culture adequate volume  Final   Culture   Final    NO GROWTH 2 DAYS Performed at [facility], [place] [address] [place], [place] [address]    Report Status PENDING  Incomplete     Serology:   Imaging: If present, new imagings (plain films, ct scans, and mri) have been personally visualized and interpreted; radiology reports have been reviewed.  Decision making incorporated into the Impression / Recommendations.    5/14 chest ct without contrast 1. Trace right pleural effusion. 2. Minimal  dependent atelectasis within the lower lobes. No acute airspace disease. 3. Stable cardiomegaly without significant pericardial effusion. 4. 4.1 cm ascending thoracic aortic aneurysm, stable. Recommend annual imaging followup by CTA or MRA. This recommendation follows 2010 ACCF/AHA/AATS/ACR/ASA/SCA/SCAI/SIR/STS/SVM Guidelines for the Diagnosis and Management of Patients with Thoracic Aortic Disease. Circulation. 2010; 121: C179-G102. Aortic aneurysm NOS (ICD10-I71.9) 5.  Aortic Atherosclerosis     5/15 cxr 1. Stable cardiomegaly with mild pulmonary vascular congestion. 2. No focal consolidation or large pleural effusion. 3. Left IJ access central line with distal tip at the junction of the SVC and innominate vein.    Jabier Mutton, [facility] [place] [phone] pager    04/30/2022, 3:10 PM

## 2022-04-30 NOTE — Progress Notes (Signed)
Notified Elgergawy MD of inability to obtain peripheral IV. Will keep central line until other access obtained or until patient is discharged.

## 2022-04-30 NOTE — Plan of Care (Signed)
  Problem: Education: Goal: Knowledge of General Education information will improve Description: Including pain rating scale, medication(s)/side effects and non-pharmacologic comfort measures Outcome: Progressing   Problem: Clinical Measurements: Goal: Respiratory complications will improve Outcome: Progressing   

## 2022-04-30 NOTE — Progress Notes (Signed)
[facility] Progress Note  Subjective: did not get HD overnight due to staffing issues. Pt seen in ICU bed. Going to floor. Off pressors.   Vitals:   04/30/22 0830 04/30/22 0900 04/30/22 1000 04/30/22 1100  BP: (!) 164/80 (!) 143/67 (!) 124/48 (!) 120/58  Pulse: 75 76 73 73  Resp: '12 12 12 17  '$ Temp:      TempSrc:      SpO2:      Weight:      Height:        Exam:  alert, nad, nasal O2  no jvd  Chest cta bilat  Cor reg no RG  Abd soft ntnd no ascites   Ext no LE edema   Lethargic , NF, ox3   RUA AVG+bruit      Home meds include - zyloprim 150, xanax, asa, lipitor, auryxia 2 ac, coreg 6.25 bid, plavix, lexapro, ensure bid, losartan 50, lopressor 12.5 bid, protonix, sevelamer 2 ac, prns/ vits/ supps   CXR - chronic vasc congestion       OP HD: [facility]  4h  350/600  45kg   2/2 bath  P2  AVG RUA  Hep none - last HD 5/12, 48.5 > 46.2kg - last Hb 10.8 on 5/08 - mircera 50 q4, last 4/21, due 5/19 - hectorol 2 ug IV tiw - sensipar 30 tiw po     Assessment/ Plan: R arm draining wound - 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 Per VVS okay to stick above the wound. MSSA sepsis - +blood cx's, rare + graft/tissue for MSSA as well. On IV Ancef. ID consulting.  ESRD - on HD MWF.  Had HD here Tuesday. Did not get HD yest, postponed to today. Plan HD again tomorrow to get back on schedule then.  Anemia esrd - Hb down 8-10 range, due to esa on 5/19, have ordered darbe 60 ug q Friday w/ HD while here.  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 Cont IV vdra and po sensipar and binders.  BP/ volume - low BP's required pressors in ICU, now is better. Holding home BP meds. Vol up 4 kg, UF 2-3 kg w/ HD today.      Rob Kinsey Cowsert 04/30/2022, 11:48 AM   Recent Labs  Lab 04/27/22 1958 04/28/22 0410 04/29/22 0449 04/29/22 1618  HGB 10.9* 10.3* 8.7*  --   ALBUMIN 3.1*  --   --  2.7*  CALCIUM 8.3* 8.6*  --  8.7*  PHOS  --    --   --  3.9  CREATININE 6.40* 6.73*  --  4.76*  K 4.8 4.7  --  3.4*    Inpatient medications:  Chlorhexidine Gluconate Cloth  6 each Topical Q0600   clopidogrel  75 mg Oral Daily   feeding supplement  237 mL Oral BID BM   heparin  5,000 Units Subcutaneous Q8H   mouth rinse  15 mL Mouth Rinse BID   midodrine  5 mg Oral TID WC   multivitamin  1 tablet Oral QHS   mupirocin ointment  1 application. Nasal BID   sodium chloride flush  10-40 mL Intracatheter Q12H    sodium chloride Stopped (04/30/22 0857)    ceFAZolin (ANCEF) IV 100 mL/hr at 04/30/22 1000   norepinephrine (LEVOPHED) Adult infusion Stopped (04/30/22 0721)   sodium chloride, acetaminophen **OR** acetaminophen, albuterol, fentaNYL (SUBLIMAZE) injection, Gerhardt's butt cream, sodium chloride flush

## 2022-05-01 DIAGNOSIS — Z992 Dependence on renal dialysis: Secondary | ICD-10-CM | POA: Diagnosis not present

## 2022-05-01 DIAGNOSIS — N186 End stage renal disease: Secondary | ICD-10-CM | POA: Diagnosis not present

## 2022-05-01 DIAGNOSIS — J9601 Acute respiratory failure with hypoxia: Secondary | ICD-10-CM | POA: Diagnosis not present

## 2022-05-01 DIAGNOSIS — R7881 Bacteremia: Secondary | ICD-10-CM | POA: Diagnosis not present

## 2022-05-01 LAB — CBC
HCT: 25.2 % — ABNORMAL LOW (ref 36.0–46.0)
Hemoglobin: 8.6 g/dL — ABNORMAL LOW (ref 12.0–15.0)
MCH: 32.8 pg (ref 26.0–34.0)
MCHC: 34.1 g/dL (ref 30.0–36.0)
MCV: 96.2 fL (ref 80.0–100.0)
Platelets: 154 10*3/uL (ref 150–400)
RBC: 2.62 MIL/uL — ABNORMAL LOW (ref 3.87–5.11)
RDW: 15.8 % — ABNORMAL HIGH (ref 11.5–15.5)
WBC: 6.4 10*3/uL (ref 4.0–10.5)
nRBC: 0 % (ref 0.0–0.2)

## 2022-05-01 LAB — BASIC METABOLIC PANEL
Anion gap: 10 (ref 5–15)
BUN: 18 mg/dL (ref 8–23)
CO2: 28 mmol/L (ref 22–32)
Calcium: 9.3 mg/dL (ref 8.9–10.3)
Chloride: 97 mmol/L — ABNORMAL LOW (ref 98–111)
Creatinine, Ser: 3.31 mg/dL — ABNORMAL HIGH (ref 0.44–1.00)
GFR, Estimated: 14 mL/min — ABNORMAL LOW (ref >60)
Glucose, Bld: 99 mg/dL (ref 70–99)
Potassium: 3.4 mmol/L — ABNORMAL LOW (ref 3.5–5.1)
Sodium: 135 mmol/L (ref 135–145)

## 2022-05-01 MED ORDER — DARBEPOETIN ALFA 60 MCG/0.3ML IJ SOSY: 60.0000 ug | PREFILLED_SYRINGE | INTRAMUSCULAR | Status: DC

## 2022-05-01 MED ORDER — DOXERCALCIFEROL 4 MCG/2ML IV SOLN
2.0000 ug | INTRAVENOUS | Status: DC
  Administered 2022-05-04: 2 ug via INTRAVENOUS
  Filled 2022-05-01 (×2): qty 2

## 2022-05-01 MED ORDER — CINACALCET HCL 30 MG PO TABS: 30.0000 mg | ORAL_TABLET | ORAL | Status: DC

## 2022-05-01 MED ORDER — FERRIC CITRATE 1 GM 210 MG(FE) PO TABS
420.0000 mg | ORAL_TABLET | Freq: Three times a day (TID) | ORAL | Status: DC
  Administered 2022-05-01 – 2022-05-04 (×8): 420 mg via ORAL
  Filled 2022-05-01 (×8): qty 2

## 2022-05-01 NOTE — Care Management Important Message (Signed)
Important Message  Patient Details  Name: [name] MRN: [id number] Date of Birth: Mar 12, 1951   Medicare Important Message Given:  Yes     Kahlil Cowans [name] 05/01/2022, 3:35 PM

## 2022-05-01 NOTE — Progress Notes (Signed)
PROGRESS NOTE    [name]  ZCH:[id number] DOB: [birth year] DOA: 04/26/2022 PCP: [name], MD   Chief Complaint  Patient presents with   Shortness of Breath   Weakness    Brief Narrative:  [name] is a 71 y.o. F with PMH significant for ESRD on HD, CHF, CAD, HL, HTN who presented to the ED on 5/14 with purulent drainage from the AV fistula on the RUE.  She reported a "bug bite" on the site.   She was admitted to the hospitalist service and started on Cefepime and Vancomycin and taken to the OR for I&D on 5/15.  Peri-operatively pt's blood pressure dropped and she was started on Levophed and admitted to ICU.  5/14 admitted with RUE AV graft infection, started on Vanc/Cefepime 5/15 Taken to OR for graft revision, hypotensive and started on Levophed, PCCM consulted, intubated 5/16 Extubated, on low dose levophed 5/18 transferred to [facility] service.    Assessment & Plan:   Principal Problem:   Acute respiratory failure with hypoxia (HCC) Active Problems:   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HCC)   End-stage renal disease on hemodialysis (HCC)   HTN (hypertension)   Chronic diastolic CHF (congestive heart failure) (HCC)   Anemia of chronic renal failure   Sepsis ([place])   MSSA bacteremia   Septic shock s in the setting of AV graft infection, staph bacteremia  -Present on admission -Requiring pressor support initially, currently weaned off pressor support.  -Off Levophed, currently on midodrine, blood pressure is acceptable, continue with current dose  AV graft infection, MSSA bacteremia -Status post AVG revision by VVS on 5/15 (small segment resected due to degeneration and new segment AVG replaced). -Per VVS okay to stick above the wound.   - TTE with no clear vegetation. Although there is TR and MR.  -Antibiotic management per ID, continue with IV Ancef.  Duration for treatment is 4 weeks from 5/16 to 6/13 and on 6/13 she can be transitioned to oral cephalexin 500 mg  p.o. nightly  Acute respiratory failure with hypoxia - extubated successfully to [place] - DC supplemental oxygen.  -Encouraged use incentive spirometry and flutter valve   End-stage renal disease - Appreciate nephrology. For HD today. - OK to use new fistula on left forearm per VVS   Anemia of chronic renal disease - Monitor CBC, transfuse as needed.   Hypertension, diastolic CHF -Volume management with dialysis  Difficult IV access -Unable to establish peripheral IV, so we will continue with left IJ for now  Deconditioning/weakness -PT/OT consulted, recommendation for SNF placement    DVT prophylaxis: Heparin Code Status: Full Family Communication: None at bedside Disposition: will need SNF placment  Status is: Inpatient    Consultants:  PCCM VVS Renal    Subjective:  No significant events overnight, she denies any complaints today, but she reports generalized weakness and fatigue  Objective: Vitals:   05/01/22 0301 05/01/22 0514 05/01/22 0731 05/01/22 1140  BP: (!) 123/56 (!) 122/53 (!) 154/75 (!) 119/54  Pulse: 72 82 78 74  Resp: '13 18 12 17  '$ Temp: 98.4 F (36.9 C) 98.4 F (36.9 C) 98 F (36.7 C) 98 F (36.7 C)  TempSrc: Oral Oral Oral Oral  SpO2: 96% 98% 97% 93%  Weight:  (S) 48.3 kg    Height:  '5\' 2"'$  (1.575 m)      Intake/Output Summary (Last 24 hours) at 05/01/2022 1255 Last data filed at 05/01/2022 0514 Gross per 24 hour  Intake 417 ml  Output  1003 ml  Net -586 ml   Filed Weights   04/30/22 1224 04/30/22 1516 05/01/22 0514  Weight: 53 kg 51.8 kg (S) 48.3 kg    Examination:  Awake Alert, Oriented X 3, No new F.N deficits, Normal affect Symmetrical Chest wall movement, Good air movement bilaterally, CTAB RRR,No Gallops,Rubs or new Murmurs, No Parasternal Heave +ve B.Sounds, Abd Soft, No tenderness, No rebound - guarding or rigidity. No Cyanosis, Clubbing or edema, No new Rash or bruise       Data Reviewed: I have personally reviewed  following labs and imaging studies  CBC: Recent Labs  Lab 04/26/22 2040 04/26/22 2054 04/27/22 1105 04/27/22 1352 04/27/22 1941 04/27/22 1958 04/28/22 0410 04/29/22 0449 05/01/22 0415  WBC 11.3*  --  9.0  --   --  20.3* 16.1* 7.3 6.4  NEUTROABS 9.8*  --  6.7  --   --   --   --   --   --   HGB 10.5*   < > 11.9*   < > 11.9* 10.9* 10.3* 8.7* 8.6*  HCT 31.1*   < > 36.6   < > 35.0* 33.0* 31.5* 25.9* 25.2*  MCV 96.9  --  97.3  --   --  97.3 97.8 96.6 96.2  PLT 184  --  167  --   --  189 207 147* 154   < > = values in this interval not displayed.    Basic Metabolic Panel: Recent Labs  Lab 04/27/22 1105 04/27/22 1352 04/27/22 1941 04/27/22 1958 04/28/22 0410 04/29/22 1618 05/01/22 0415  NA 135   < > 134* 135 134* 137 135  K 5.0   < > 4.7 4.8 4.7 3.4* 3.4*  CL 94*  --   --  96* 97* 99 97*  CO2 24  --   --  21* '22 26 28  '$ GLUCOSE 80  --   --  204* 166* 110* 99  BUN 44*  --   --  46* 50* 30* 18  CREATININE 6.36*  --   --  6.40* 6.73* 4.76* 3.31*  CALCIUM 8.9  --   --  8.3* 8.6* 8.7* 9.3  MG 2.1  --   --  2.2  --   --   --   PHOS  --   --   --   --   --  3.9  --    < > = values in this interval not displayed.    GFR: Estimated Creatinine Clearance: 11.9 mL/min (A) (by C-G formula based on SCr of 3.31 mg/dL (H)).  Liver Function Tests: Recent Labs  Lab 04/26/22 2040 04/27/22 1105 04/27/22 1958 04/29/22 1618  AST 27 40 144*  --   ALT 16 20 63*  --   ALKPHOS 121 101 189*  --   BILITOT 1.3* 1.1 1.2  --   PROT 6.8 6.9 6.4*  --   ALBUMIN 2.9* 2.9* 3.1* 2.7*    CBG: Recent Labs  Lab 04/27/22 1557 04/27/22 1619 04/27/22 1942 04/28/22 0156 04/28/22 2003  GLUCAP 65* 120* 179* 189* 114*     Recent Results (from the past 240 hour(s))  Blood Culture (routine x 2)     Status: Abnormal   Collection Time: 04/26/22  8:40 PM   Specimen: BLOOD  Result Value Ref Range Status   Specimen Description BLOOD  Final   Special Requests   Final    RIGHT EJ BOTTLES DRAWN  AEROBIC AND ANAEROBIC Blood Culture adequate volume  Culture  Setup Time   Final    GRAM POSITIVE COCCI IN BOTH AEROBIC AND ANAEROBIC BOTTLES CRITICAL RESULT CALLED TO, READ BACK BY AND VERIFIED WITH: PHARMD ALEX [phone] [id number] FCP Performed at [facility], [place] [address] [place], [place] [address]    Culture STAPHYLOCOCCUS AUREUS (A)  Final   Report Status 04/29/2022 FINAL  Final   Organism ID, Bacteria STAPHYLOCOCCUS AUREUS  Final      Susceptibility   Staphylococcus aureus - MIC*    CIPROFLOXACIN <=0.5 SENSITIVE Sensitive     ERYTHROMYCIN <=0.25 SENSITIVE Sensitive     GENTAMICIN <=0.5 SENSITIVE Sensitive     OXACILLIN 0.5 SENSITIVE Sensitive     TETRACYCLINE <=1 SENSITIVE Sensitive     VANCOMYCIN 1 SENSITIVE Sensitive     TRIMETH/SULFA <=10 SENSITIVE Sensitive     CLINDAMYCIN <=0.25 SENSITIVE Sensitive     RIFAMPIN <=0.5 SENSITIVE Sensitive     Inducible Clindamycin NEGATIVE Sensitive     * STAPHYLOCOCCUS AUREUS  Blood Culture ID Panel (Reflexed)     Status: Abnormal   Collection Time: 04/26/22  8:40 PM  Result Value Ref Range Status   Enterococcus faecalis NOT DETECTED NOT DETECTED Final   Enterococcus Faecium NOT DETECTED NOT DETECTED Final   Listeria monocytogenes NOT DETECTED NOT DETECTED Final   Staphylococcus species DETECTED (A) NOT DETECTED Final    Comment: CRITICAL RESULT CALLED TO, READ BACK BY AND VERIFIED WITH: PHARMD ALEX L [id number] FCP    Staphylococcus aureus (BCID) DETECTED (A) NOT DETECTED Final    Comment: CRITICAL RESULT CALLED TO, READ BACK BY AND VERIFIED WITH: PHARMD ALEX L [id number] FCP    Staphylococcus epidermidis NOT DETECTED NOT DETECTED Final   Staphylococcus lugdunensis NOT DETECTED NOT DETECTED Final   Streptococcus species NOT DETECTED NOT DETECTED Final   Streptococcus agalactiae NOT DETECTED NOT DETECTED Final   Streptococcus pneumoniae NOT DETECTED NOT DETECTED Final   Streptococcus pyogenes NOT DETECTED NOT DETECTED Final    A.calcoaceticus-baumannii NOT DETECTED NOT DETECTED Final   Bacteroides fragilis NOT DETECTED NOT DETECTED Final   Enterobacterales NOT DETECTED NOT DETECTED Final   Enterobacter cloacae complex NOT DETECTED NOT DETECTED Final   Escherichia coli NOT DETECTED NOT DETECTED Final   Klebsiella aerogenes NOT DETECTED NOT DETECTED Final   Klebsiella oxytoca NOT DETECTED NOT DETECTED Final   Klebsiella pneumoniae NOT DETECTED NOT DETECTED Final   Proteus species NOT DETECTED NOT DETECTED Final   Salmonella species NOT DETECTED NOT DETECTED Final   Serratia marcescens NOT DETECTED NOT DETECTED Final   Haemophilus influenzae NOT DETECTED NOT DETECTED Final   Neisseria meningitidis NOT DETECTED NOT DETECTED Final   Pseudomonas aeruginosa NOT DETECTED NOT DETECTED Final   Stenotrophomonas maltophilia NOT DETECTED NOT DETECTED Final   Candida albicans NOT DETECTED NOT DETECTED Final   Candida auris NOT DETECTED NOT DETECTED Final   Candida glabrata NOT DETECTED NOT DETECTED Final   Candida krusei NOT DETECTED NOT DETECTED Final   Candida parapsilosis NOT DETECTED NOT DETECTED Final   Candida tropicalis NOT DETECTED NOT DETECTED Final   Cryptococcus neoformans/gattii NOT DETECTED NOT DETECTED Final   Meth resistant mecA/C and MREJ NOT DETECTED NOT DETECTED Final    Comment: Performed at [facility], [address] [place], [place] [address]  Resp Panel by RT-PCR (Flu A&B, Covid) Nasopharyngeal Swab     Status: None   Collection Time: 04/26/22  9:35 PM   Specimen: Nasopharyngeal Swab; Nasopharyngeal(NP) swabs in vial transport  medium  Result Value Ref Range Status   SARS Coronavirus 2 by RT PCR NEGATIVE NEGATIVE Final    Comment: (NOTE) SARS-CoV-2 target nucleic acids are NOT DETECTED.  The SARS-CoV-2 RNA is generally detectable in upper respiratory specimens during the acute phase of infection. The lowest concentration of SARS-CoV-2 viral copies this assay can detect is 138 copies/mL. A  negative result does not preclude SARS-Cov-2 infection and should not be used as the sole basis for treatment or other patient management decisions. A negative result may occur with  improper specimen collection/handling, submission of specimen other than nasopharyngeal swab, presence of viral mutation(s) within the areas targeted by this assay, and inadequate number of viral copies(<138 copies/mL). A negative result must be combined with clinical observations, patient history, and epidemiological information. The expected result is Negative.  Fact Sheet for Patients:  EntrepreneurPulse.com.au  Fact Sheet for Healthcare Providers:  IncredibleEmployment.be  This test is no t yet approved or cleared by the [place] FDA and  has been authorized for detection and/or diagnosis of SARS-CoV-2 by FDA under an Emergency Use Authorization (EUA). This EUA will remain  in effect (meaning this test can be used) for the duration of the COVID-19 declaration under Section 564(b)(1) of the Act, 21 U.S.C.section 360bbb-3(b)(1), unless the authorization is terminated  or revoked sooner.       Influenza A by PCR NEGATIVE NEGATIVE Final   Influenza B by PCR NEGATIVE NEGATIVE Final    Comment: (NOTE) The Xpert Xpress SARS-CoV-2/FLU/RSV plus assay is intended as an aid in the diagnosis of influenza from Nasopharyngeal swab specimens and should not be used as a sole basis for treatment. Nasal washings and aspirates are unacceptable for Xpert Xpress SARS-CoV-2/FLU/RSV testing.  Fact Sheet for Patients: EntrepreneurPulse.com.au  Fact Sheet for Healthcare Providers: IncredibleEmployment.be  This test is not yet approved or cleared by the [place] FDA and has been authorized for detection and/or diagnosis of SARS-CoV-2 by FDA under an Emergency Use Authorization (EUA). This EUA will remain in effect (meaning this test can  be used) for the duration of the COVID-19 declaration under Section 564(b)(1) of the Act, 21 U.S.C. section 360bbb-3(b)(1), unless the authorization is terminated or revoked.  Performed at [facility], [place] [address], [place], [place] [address]   Surgical pcr screen     Status: Abnormal   Collection Time: 04/27/22 12:19 PM   Specimen: Nasal Mucosa; Nasal Swab  Result Value Ref Range Status   MRSA, PCR POSITIVE (A) NEGATIVE Final    Comment: RESULT CALLED TO, READ BACK BY AND VERIFIED WITH: RN N.GAUNTLETT ON [id number] AT 1425 BY E.PARRISH    Staphylococcus aureus POSITIVE (A) NEGATIVE Final    Comment: (NOTE) The Xpert SA Assay (FDA approved for NASAL specimens in patients 56 years of age and older), is one component of a comprehensive surveillance program. It is not intended to diagnose infection nor to guide or monitor treatment. Performed at [facility], [place] [address] [place], [place] [address]   Aerobic/Anaerobic Culture w Gram Stain (surgical/deep wound)     Status: None (Preliminary result)   Collection Time: 04/27/22  2:47 PM   Specimen: Other Source; Body Fluid  Result Value Ref Range Status   Specimen Description WOUND  Final   Special Requests AV GRAFT PT ON ANCEF  Final   Gram Stain   Final    NO WBC SEEN RARE GRAM POSITIVE COCCI IN PAIRS Performed at [facility], [address] [place], [place] [address]    Culture   Final    RARE STAPHYLOCOCCUS AUREUS NO ANAEROBES ISOLATED; CULTURE IN PROGRESS FOR 5 DAYS    Report Status PENDING  Incomplete   Organism ID, Bacteria STAPHYLOCOCCUS AUREUS  Final      Susceptibility   Staphylococcus aureus - MIC*    CIPROFLOXACIN <=0.5 SENSITIVE Sensitive     ERYTHROMYCIN <=0.25 SENSITIVE Sensitive     GENTAMICIN <=0.5 SENSITIVE Sensitive     OXACILLIN <=0.25 SENSITIVE Sensitive     TETRACYCLINE <=1 SENSITIVE Sensitive     VANCOMYCIN 1 SENSITIVE Sensitive     TRIMETH/SULFA <=10 SENSITIVE Sensitive      CLINDAMYCIN <=0.25 SENSITIVE Sensitive     RIFAMPIN <=0.5 SENSITIVE Sensitive     Inducible Clindamycin NEGATIVE Sensitive     * RARE STAPHYLOCOCCUS AUREUS  Culture, blood (Routine X 2) w Reflex to ID Panel     Status: None (Preliminary result)   Collection Time: 04/28/22  8:31 AM   Specimen: BLOOD LEFT FOREARM  Result Value Ref Range Status   Specimen Description BLOOD LEFT FOREARM  Final   Special Requests   Final    BOTTLES DRAWN AEROBIC AND ANAEROBIC Blood Culture adequate volume   Culture   Final    NO GROWTH 3 DAYS Performed at [facility], [address], [place], [place] [address]    Report Status PENDING  Incomplete  Culture, blood (Routine X 2) w Reflex to ID Panel     Status: None (Preliminary result)   Collection Time: 04/28/22  8:31 AM   Specimen: BLOOD  Result Value Ref Range Status   Specimen Description BLOOD LEFT ANTECUBITAL  Final   Special Requests IN PEDIATRIC BOTTLE Blood Culture adequate volume  Final   Culture   Final    NO GROWTH 3 DAYS Performed at [facility], [place] [address], [place], [place] [address]    Report Status PENDING  Incomplete         Radiology Studies: No results found.      Scheduled Meds:  Chlorhexidine Gluconate Cloth  6 each Topical Q0600   clopidogrel  75 mg Oral Daily   feeding supplement  237 mL Oral BID BM   heparin  5,000 Units Subcutaneous Q8H   mouth rinse  15 mL Mouth Rinse BID   midodrine  5 mg Oral TID WC   multivitamin  1 tablet Oral QHS   mupirocin ointment  1 application. Nasal BID   sodium chloride flush  10-40 mL Intracatheter Q12H   Continuous Infusions:  sodium chloride Stopped (04/30/22 0857)    ceFAZolin (ANCEF) IV       LOS: 5 days       Phillips Climes, MD [facility]   To contact the attending provider between 7A-7P or the covering provider during after hours 7P-7A, please log into the web site [email or web address] and access using universal [place] password for that  web site. If you do not have the password, please call the hospital operator.  05/01/2022, 12:55 PM

## 2022-05-01 NOTE — Progress Notes (Signed)
Communicated with pharmacy on [address] pertaining to Cefazolin unavailability. Fridge checked; medication unavailable. Abx d/c 00:00 5/19.

## 2022-05-01 NOTE — Plan of Care (Signed)
  Problem: Health Behavior/Discharge Planning: Goal: Ability to manage health-related needs will improve Outcome: Progressing   Problem: Clinical Measurements: Goal: Will remain free from infection Outcome: Progressing Goal: Respiratory complications will improve Outcome: Progressing Goal: Cardiovascular complication will be avoided Outcome: Progressing   

## 2022-05-01 NOTE — Progress Notes (Addendum)
[facility] Progress Note  Subjective: pt had HD last night. Feeling better today.   Vitals:   05/01/22 1327 05/01/22 1335 05/01/22 1400 05/01/22 1430  BP: (!) 126/53 (!) 133/54 (!) 118/52 (!) 108/31  Pulse: 75 76 73 83  Resp: '12 12 10 15  '$ Temp: 98.2 F (36.8 C)     TempSrc: Oral     SpO2: 93%     Weight: 52.9 kg     Height:        Exam:  alert, nad, nasal O2  no jvd  Chest cta bilat  Cor reg no RG  Abd soft ntnd no ascites   Ext no LE edema   Lethargic , NF, ox3   RUA AVG+bruit      Home meds include - zyloprim 150, xanax, asa, lipitor, auryxia 2 ac, coreg 6.25 bid, plavix, lexapro, ensure bid, losartan 50, lopressor 12.5 bid, protonix, sevelamer 2 ac, prns/ vits/ supps   CXR - chronic vasc congestion       OP HD: [facility]  4h  350/600  45kg   2/2 bath  P2  AVG RUA  Hep none - last HD 5/12, 48.5 > 46.2kg - last Hb 10.8 on 5/08 - mircera 50 q4, last 4/21, due 5/19 - hectorol 2 ug IV tiw - sensipar 30 tiw po     Assessment/ Plan: R arm draining wound - 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 AVG is patent and okay to use; stick above the wound per VVS. MSSA sepsis - +blood cx's, rare + graft/tissue for MSSA as well. On IV Ancef. F/u blood cx's are negative. Per ID.  ESRD - on HD MWF. Had HD here off sched this week on tues/ Thursday, next HD today to get back on schedule.  BP/ volume - low BP's required pressors in ICU, shock resolved. Holding home BP meds and on midodrine 2 tid which is new. Up 3kg now, better. UFG today 2-3 L.  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 Cont IV vdra and po sensipar and binders.      Rob Lyam Provencio 05/01/2022, 2:44 PM   Recent Labs  Lab 04/27/22 1958 04/28/22 0410 04/29/22 0449 04/29/22 1618 05/01/22 0415  HGB 10.9*   < > 8.7*  --  8.6*  ALBUMIN 3.1*  --   --   2.7*  --   CALCIUM 8.3*   < >  --  8.7* 9.3  PHOS  --   --   --  3.9  --   CREATININE 6.40*   < >  --  4.76* 3.31*  K 4.8   < >  --  3.4* 3.4*   < > = values in this interval not displayed.   Inpatient medications:  Chlorhexidine Gluconate Cloth  6 each Topical Q0600   [START ON 05/04/2022] cinacalcet  30 mg Oral Q M,W,F-HD   clopidogrel  75 mg Oral Daily   [START ON 05/08/2022] darbepoetin (ARANESP) injection - DIALYSIS  60 mcg Intravenous Q Fri-HD   [START ON 05/04/2022] doxercalciferol  2 mcg Intravenous Q M,W,F-HD   feeding supplement  237 mL Oral BID BM   ferric citrate  420 mg Oral TID WC   heparin  5,000 Units Subcutaneous Q8H   mouth rinse  15 mL Mouth Rinse BID  midodrine  5 mg Oral TID WC   multivitamin  1 tablet Oral QHS   mupirocin ointment  1 application. Nasal BID   sodium chloride flush  10-40 mL Intracatheter Q12H    sodium chloride Stopped (04/30/22 0857)    ceFAZolin (ANCEF) IV     sodium chloride, acetaminophen **OR** acetaminophen, albuterol, fentaNYL (SUBLIMAZE) injection, Gerhardt's butt cream, sodium chloride flush

## 2022-05-01 NOTE — TOC Progression Note (Addendum)
Transition of Care [facility]) - Progression Note    Patient Details  Name: LAKENDRA HELLING MRN: [id number] Date of Birth: 1951/06/07  Transition of Care [facility]) CM/SW [place], [place] Phone Number: 05/01/2022, 3:49 PM  Clinical Narrative:    CSW spoke with pt [name] who shared that pt has been to [facility] in the past and pt would go back. CSW followed up with Juliann Pulse at [facility] for pt bed availability. Juliann Pulse has agreed to accept pt over the weekend if Josem Kaufmann is approved. CSW started auth ref# [id number]  Pt son contacted CSW asking if pt can go to [facility] instead bc [facility] was too far from family. CSW contacted [employer], no answer. CSW sent a text to admissions about bed availability and will follow up with pt son.   Expected Discharge Plan: [place] Barriers to Discharge: Continued Medical Work up  Expected Discharge Plan and Services Expected Discharge Plan: [place] In-house Referral: Clinical Social Work                                             Social Determinants of Health (SDOH) Interventions    Readmission Risk Interventions    11/11/2021    3:49 PM 08/18/2021    3:01 PM  Readmission Risk Prevention Plan  Transportation Screening Complete Complete  Medication Review (RN Care Manager) Complete Complete  PCP or Specialist appointment within 3-5 days of discharge Complete Complete  HRI or Home Care Consult Complete Complete  SW Recovery Care/Counseling Consult Complete Complete  Palliative Care Screening Not Applicable Not Applicable  Skilled Nursing Facility Complete Complete

## 2022-05-02 DIAGNOSIS — T827XXD Infection and inflammatory reaction due to other cardiac and vascular devices, implants and grafts, subsequent encounter: Secondary | ICD-10-CM | POA: Diagnosis not present

## 2022-05-02 DIAGNOSIS — N186 End stage renal disease: Secondary | ICD-10-CM | POA: Diagnosis not present

## 2022-05-02 DIAGNOSIS — D631 Anemia in chronic kidney disease: Secondary | ICD-10-CM | POA: Diagnosis not present

## 2022-05-02 DIAGNOSIS — N185 Chronic kidney disease, stage 5: Secondary | ICD-10-CM | POA: Diagnosis not present

## 2022-05-02 LAB — BASIC METABOLIC PANEL
Anion gap: 11 (ref 5–15)
BUN: 12 mg/dL (ref 8–23)
CO2: 27 mmol/L (ref 22–32)
Calcium: 9.3 mg/dL (ref 8.9–10.3)
Chloride: 94 mmol/L — ABNORMAL LOW (ref 98–111)
Creatinine, Ser: 2.76 mg/dL — ABNORMAL HIGH (ref 0.44–1.00)
GFR, Estimated: 18 mL/min — ABNORMAL LOW (ref >60)
Glucose, Bld: 91 mg/dL (ref 70–99)
Potassium: 3.5 mmol/L (ref 3.5–5.1)
Sodium: 132 mmol/L — ABNORMAL LOW (ref 135–145)

## 2022-05-02 LAB — CBC
HCT: 25.8 % — ABNORMAL LOW (ref 36.0–46.0)
Hemoglobin: 8.3 g/dL — ABNORMAL LOW (ref 12.0–15.0)
MCH: 31.2 pg (ref 26.0–34.0)
MCHC: 32.2 g/dL (ref 30.0–36.0)
MCV: 97 fL (ref 80.0–100.0)
Platelets: 181 10*3/uL (ref 150–400)
RBC: 2.66 MIL/uL — ABNORMAL LOW (ref 3.87–5.11)
RDW: 15.7 % — ABNORMAL HIGH (ref 11.5–15.5)
WBC: 7.3 10*3/uL (ref 4.0–10.5)
nRBC: 0 % (ref 0.0–0.2)

## 2022-05-02 LAB — AEROBIC/ANAEROBIC CULTURE W GRAM STAIN (SURGICAL/DEEP WOUND): Gram Stain: NONE SEEN

## 2022-05-02 MED ORDER — CHLORHEXIDINE GLUCONATE CLOTH 2 % EX PADS
6.0000 | MEDICATED_PAD | Freq: Every day | CUTANEOUS | Status: DC
  Administered 2022-05-02 – 2022-05-04 (×3): 6 via TOPICAL

## 2022-05-02 NOTE — Progress Notes (Signed)
[facility] Progress Note  Subjective: pt had HD yest again w/ 2.5 L off .   Vitals:   05/02/22 0340 05/02/22 0342 05/02/22 0731 05/02/22 0741  BP:  (!) 90/40  (!) 113/40  Pulse: 79   81  Resp: 16   10  Temp:  98 F (36.7 C) (!) 97.4 F (36.3 C) (!) 97.4 F (36.3 C)  TempSrc:  Oral Oral Oral  SpO2: 94%   95%  Weight:  50.1 kg    Height:        Exam:  alert, nad, nasal O2  no jvd  Chest cta bilat  Cor reg no RG  Abd soft ntnd no ascites   Ext no LE edema, UE edema is resolved for the most part   Lethargic , NF, ox3   RUA AVG+bruit      Home meds include - zyloprim 150, xanax, asa, lipitor, auryxia 2 ac, coreg 6.25 bid, plavix, lexapro, ensure bid, losartan 50, lopressor 12.5 bid, protonix, sevelamer 2 ac, prns/ vits/ supps   CXR - chronic vasc congestion       OP HD[facility]  4h  350/600  45kg   2/2 bath  P2  AVG RUA  Hep none - last HD 5/12, 48.5 > 46.2kg - last Hb 10.8 on 5/08 - mircera 50 q4, last 4/21, due 5/19 - hectorol 2 ug IV tiw - sensipar 30 tiw po     Assessment/ Plan: R arm draining wound - 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 AVG is patent and okay to use; stick above the wound per VVS. MSSA sepsis - +blood cx's, rare + graft/tissue for MSSA as well. On IV Ancef. F/u blood cx's are negative. Per ID.  ESRD - on HD MWF. Had HD Tu-Th-Fri this week here. Next HD Monday.  BP/ volume - low BP's required pressors in ICU, shock resolved. Holding home BP meds and on midodrine 5 tid which is new. Wt's still up but doubt accuracy, looks euvolemic today, edema improved.  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  BMD ckd - CCa and phos are in range. Cont IV vdra and po sensipar and binders.      [name] 05/02/2022, 9:05 AM   Recent Labs  Lab 04/27/22 1958 04/28/22 0410 04/29/22 1618 05/01/22 0415  05/02/22 0408  HGB 10.9*   < >  --  8.6* 8.3*  ALBUMIN 3.1*  --  2.7*  --   --   CALCIUM 8.3*   < > 8.7* 9.3 9.3  PHOS  --   --  3.9  --   --   CREATININE 6.40*   < > 4.76* 3.31* 2.76*  K 4.8   < > 3.4* 3.4* 3.5   < > = values in this interval not displayed.    Inpatient medications:  [START ON 05/04/2022] cinacalcet  30 mg Oral Q M,W,F-HD   clopidogrel  75 mg Oral Daily   [START ON 05/08/2022] darbepoetin (ARANESP) injection - DIALYSIS  60 mcg Intravenous Q Fri-HD   [START ON 05/04/2022] doxercalciferol  2 mcg Intravenous Q M,W,F-HD   feeding supplement  237 mL Oral BID BM   ferric citrate  420 mg Oral TID WC   heparin  5,000 Units Subcutaneous Q8H   mouth rinse  15 mL Mouth Rinse BID  midodrine  5 mg Oral TID WC   multivitamin  1 tablet Oral QHS   mupirocin ointment  1 application. Nasal BID   sodium chloride flush  10-40 mL Intracatheter Q12H    sodium chloride Stopped (05/01/22 2312)    ceFAZolin (ANCEF) IV Stopped (05/01/22 2255)   sodium chloride, acetaminophen **OR** acetaminophen, albuterol, fentaNYL (SUBLIMAZE) injection, Gerhardt's butt cream, sodium chloride flush

## 2022-05-02 NOTE — TOC Progression Note (Signed)
Transition of Care [facility]) - Progression Note    Patient Details  Name: [name] MRN: [id number] Date of Birth: Sep 27, 1951  Transition of Care [facility]) CM/SW [place], LCSW Phone Number:[phone] 05/02/2022, 3:24 PM  Clinical Narrative:     CSW was alerted that [facility] has made a bed offer to pt. Pt's authorization is still pending. Pt's facility will need to be changed.  TOC team will continue to assist with discharge planning needs.   Expected Discharge Plan: [place] Barriers to Discharge: Continued Medical Work up  Expected Discharge Plan and Services Expected Discharge Plan: [place] In-house Referral: Clinical Social Work                                             Social Determinants of Health (SDOH) Interventions    Readmission Risk Interventions    11/11/2021    3:49 PM 08/18/2021    3:01 PM  Readmission Risk Prevention Plan  Transportation Screening Complete Complete  Medication Review (RN Care Manager) Complete Complete  PCP or Specialist appointment within 3-5 days of discharge Complete Complete  HRI or Home Care Consult Complete Complete  SW Recovery Care/Counseling Consult Complete Complete  Palliative Care Screening Not Applicable Not Applicable  Skilled Nursing Facility Complete Complete

## 2022-05-02 NOTE — Plan of Care (Signed)

## 2022-05-02 NOTE — Progress Notes (Signed)
PROGRESS NOTE    [name]  ZOX:[id number] DOB: [birth year] DOA: 04/26/2022 PCP: [name], MD   Chief Complaint  Patient presents with   Shortness of Breath   Weakness    Brief Narrative:  [name] is a 71 y.o. F with PMH significant for ESRD on HD, CHF, CAD, HL, HTN who presented to the ED on 5/14 with purulent drainage from the AV fistula on the RUE.  She reported a "bug bite" on the site.   She was admitted to the hospitalist service and started on Cefepime and Vancomycin and taken to the OR for I&D on 5/15.  Peri-operatively pt's blood pressure dropped and she was started on Levophed and admitted to ICU.  5/14 admitted with RUE AV graft infection, started on Vanc/Cefepime 5/15 Taken to OR for graft revision, hypotensive and started on Levophed, PCCM consulted, intubated 5/16 Extubated, on low dose levophed 5/18 transferred to [facility] service.    Assessment & Plan:   Principal Problem:   Acute respiratory failure with hypoxia (HCC) Active Problems:   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HCC)   End-stage renal disease on hemodialysis (HCC)   HTN (hypertension)   Chronic diastolic CHF (congestive heart failure) (HCC)   Anemia of chronic renal failure   Sepsis ([place])   MSSA bacteremia   Septic shock s in the setting of AV graft infection, staph bacteremia  -Present on admission -Requiring pressor support initially, currently weaned off pressor support.  -Blood pressure is stable on midodrine.  AV graft infection, MSSA bacteremia -Status post AVG revision by VVS on 5/15 (small segment resected due to degeneration and new segment AVG replaced). -Per VVS okay to stick above the wound.   - TTE with no clear vegetation. Although there is TR and MR.  -Antibiotic management per ID, continue with IV Ancef.  Duration for treatment is 4 weeks from 5/16 to 6/13 and on 6/13 she can be transitioned to oral cephalexin 500 mg p.o. nightly  Acute respiratory failure with hypoxia -  extubated successfully to [place] - DC supplemental oxygen.  -Encouraged use incentive spirometry and flutter valve   End-stage renal disease -Management per nephrology ,she was dialyzed yesterday to go on Monday Wednesday Friday, her outpatient dialysis schedule. - OK to use new fistula on left forearm per VVS   Anemia of chronic renal disease - Monitor CBC, transfuse as needed.   Hypertension, diastolic CHF -Volume management with dialysis  Difficult IV access -Unable to establish peripheral IV, so we will continue with left IJ for now  Deconditioning/weakness -PT/OT consulted, recommendation for SNF placement    DVT prophylaxis: Heparin Code Status: Full Family Communication: None at bedside Disposition: will need SNF placment  Status is: Inpatient Patient is medically stable for discharge once SNF bed is available.    Consultants:  PCCM VVS Renal    Subjective:  No significant events overnight, she denies any complaints.  Objective: Vitals:   05/02/22 0340 05/02/22 0342 05/02/22 0731 05/02/22 0741  BP:  (!) 90/40  (!) 113/40  Pulse: 79   81  Resp: 16   10  Temp:  98 F (36.7 C) (!) 97.4 F (36.3 C) (!) 97.4 F (36.3 C)  TempSrc:  Oral Oral Oral  SpO2: 94%   95%  Weight:  50.1 kg    Height:        Intake/Output Summary (Last 24 hours) at 05/02/2022 1148 Last data filed at 05/02/2022 0840 Gross per 24 hour  Intake 942.92 ml  Output  2500 ml  Net -1557.08 ml   Filed Weights   05/01/22 1327 05/01/22 1707 05/02/22 0342  Weight: 52.9 kg 50.4 kg 50.1 kg    Examination:  Awake Alert, pleasant, no apparent distress. Symmetrical Chest wall movement, Good air movement bilaterally, CTAB RRR,No Gallops,Rubs or new Murmurs, No Parasternal Heave +ve B.Sounds, Abd Soft, No tenderness, No rebound - guarding or rigidity. No Cyanosis, Clubbing or edema, No new Rash or bruise        Data Reviewed: I have personally reviewed following labs and imaging  studies  CBC: Recent Labs  Lab 04/26/22 2040 04/26/22 2054 04/27/22 1105 04/27/22 1352 04/27/22 1958 04/28/22 0410 04/29/22 0449 05/01/22 0415 05/02/22 0408  WBC 11.3*  --  9.0  --  20.3* 16.1* 7.3 6.4 7.3  NEUTROABS 9.8*  --  6.7  --   --   --   --   --   --   HGB 10.5*   < > 11.9*   < > 10.9* 10.3* 8.7* 8.6* 8.3*  HCT 31.1*   < > 36.6   < > 33.0* 31.5* 25.9* 25.2* 25.8*  MCV 96.9  --  97.3  --  97.3 97.8 96.6 96.2 97.0  PLT 184  --  167  --  189 207 147* 154 181   < > = values in this interval not displayed.    Basic Metabolic Panel: Recent Labs  Lab 04/27/22 1105 04/27/22 1352 04/27/22 1958 04/28/22 0410 04/29/22 1618 05/01/22 0415 05/02/22 0408  NA 135   < > 135 134* 137 135 132*  K 5.0   < > 4.8 4.7 3.4* 3.4* 3.5  CL 94*  --  96* 97* 99 97* 94*  CO2 24  --  21* '22 26 28 27  '$ GLUCOSE 80  --  204* 166* 110* 99 91  BUN 44*  --  46* 50* 30* 18 12  CREATININE 6.36*  --  6.40* 6.73* 4.76* 3.31* 2.76*  CALCIUM 8.9  --  8.3* 8.6* 8.7* 9.3 9.3  MG 2.1  --  2.2  --   --   --   --   PHOS  --   --   --   --  3.9  --   --    < > = values in this interval not displayed.    GFR: Estimated Creatinine Clearance: 14.8 mL/min (A) (by C-G formula based on SCr of 2.76 mg/dL (H)).  Liver Function Tests: Recent Labs  Lab 04/26/22 2040 04/27/22 1105 04/27/22 1958 04/29/22 1618  AST 27 40 144*  --   ALT 16 20 63*  --   ALKPHOS 121 101 189*  --   BILITOT 1.3* 1.1 1.2  --   PROT 6.8 6.9 6.4*  --   ALBUMIN 2.9* 2.9* 3.1* 2.7*    CBG: Recent Labs  Lab 04/27/22 1557 04/27/22 1619 04/27/22 1942 04/28/22 0156 04/28/22 2003  GLUCAP 65* 120* 179* 189* 114*     Recent Results (from the past 240 hour(s))  Blood Culture (routine x 2)     Status: Abnormal   Collection Time: 04/26/22  8:40 PM   Specimen: BLOOD  Result Value Ref Range Status   Specimen Description BLOOD  Final   Special Requests   Final    RIGHT EJ BOTTLES DRAWN AEROBIC AND ANAEROBIC Blood Culture  adequate volume   Culture  Setup Time   Final    GRAM POSITIVE COCCI IN BOTH AEROBIC AND ANAEROBIC BOTTLES CRITICAL RESULT CALLED  TO, READ BACK BY AND VERIFIED WITH: PHARMD ALEX [id number] FCP Performed at [facility], [place] [address], [place], [place] [address]    Culture STAPHYLOCOCCUS AUREUS (A)  Final   Report Status 04/29/2022 FINAL  Final   Organism ID, Bacteria STAPHYLOCOCCUS AUREUS  Final      Susceptibility   Staphylococcus aureus - MIC*    CIPROFLOXACIN <=0.5 SENSITIVE Sensitive     ERYTHROMYCIN <=0.25 SENSITIVE Sensitive     GENTAMICIN <=0.5 SENSITIVE Sensitive     OXACILLIN 0.5 SENSITIVE Sensitive     TETRACYCLINE <=1 SENSITIVE Sensitive     VANCOMYCIN 1 SENSITIVE Sensitive     TRIMETH/SULFA <=10 SENSITIVE Sensitive     CLINDAMYCIN <=0.25 SENSITIVE Sensitive     RIFAMPIN <=0.5 SENSITIVE Sensitive     Inducible Clindamycin NEGATIVE Sensitive     * STAPHYLOCOCCUS AUREUS  Blood Culture ID Panel (Reflexed)     Status: Abnormal   Collection Time: 04/26/22  8:40 PM  Result Value Ref Range Status   Enterococcus faecalis NOT DETECTED NOT DETECTED Final   Enterococcus Faecium NOT DETECTED NOT DETECTED Final   Listeria monocytogenes NOT DETECTED NOT DETECTED Final   Staphylococcus species DETECTED (A) NOT DETECTED Final    Comment: CRITICAL RESULT CALLED TO, READ BACK BY AND VERIFIED WITH: PHARMD ALEX L [id number] FCP    Staphylococcus aureus (BCID) DETECTED (A) NOT DETECTED Final    Comment: CRITICAL RESULT CALLED TO, READ BACK BY AND VERIFIED WITH: PHARMD ALEX L [id number] FCP    Staphylococcus epidermidis NOT DETECTED NOT DETECTED Final   Staphylococcus lugdunensis NOT DETECTED NOT DETECTED Final   Streptococcus species NOT DETECTED NOT DETECTED Final   Streptococcus agalactiae NOT DETECTED NOT DETECTED Final   Streptococcus pneumoniae NOT DETECTED NOT DETECTED Final   Streptococcus pyogenes NOT DETECTED NOT DETECTED Final   A.calcoaceticus-baumannii NOT  DETECTED NOT DETECTED Final   Bacteroides fragilis NOT DETECTED NOT DETECTED Final   Enterobacterales NOT DETECTED NOT DETECTED Final   Enterobacter cloacae complex NOT DETECTED NOT DETECTED Final   Escherichia coli NOT DETECTED NOT DETECTED Final   Klebsiella aerogenes NOT DETECTED NOT DETECTED Final   Klebsiella oxytoca NOT DETECTED NOT DETECTED Final   Klebsiella pneumoniae NOT DETECTED NOT DETECTED Final   Proteus species NOT DETECTED NOT DETECTED Final   Salmonella species NOT DETECTED NOT DETECTED Final   Serratia marcescens NOT DETECTED NOT DETECTED Final   Haemophilus influenzae NOT DETECTED NOT DETECTED Final   Neisseria meningitidis NOT DETECTED NOT DETECTED Final   Pseudomonas aeruginosa NOT DETECTED NOT DETECTED Final   Stenotrophomonas maltophilia NOT DETECTED NOT DETECTED Final   Candida albicans NOT DETECTED NOT DETECTED Final   Candida auris NOT DETECTED NOT DETECTED Final   Candida glabrata NOT DETECTED NOT DETECTED Final   Candida krusei NOT DETECTED NOT DETECTED Final   Candida parapsilosis NOT DETECTED NOT DETECTED Final   Candida tropicalis NOT DETECTED NOT DETECTED Final   Cryptococcus neoformans/gattii NOT DETECTED NOT DETECTED Final   Meth resistant mecA/C and MREJ NOT DETECTED NOT DETECTED Final    Comment: Performed at [facility], [address] [place], [place] [address]  Resp Panel by RT-PCR (Flu A&B, Covid) Nasopharyngeal Swab     Status: None   Collection Time: 04/26/22  9:35 PM   Specimen: Nasopharyngeal Swab; Nasopharyngeal(NP) swabs in vial transport medium  Result Value Ref Range Status   SARS Coronavirus 2 by RT PCR NEGATIVE NEGATIVE Final    Comment: (  NOTE) SARS-CoV-2 target nucleic acids are NOT DETECTED.  The SARS-CoV-2 RNA is generally detectable in upper respiratory specimens during the acute phase of infection. The lowest concentration of SARS-CoV-2 viral copies this assay can detect is 138 copies/mL. A negative result does not  preclude SARS-Cov-2 infection and should not be used as the sole basis for treatment or other patient management decisions. A negative result may occur with  improper specimen collection/handling, submission of specimen other than nasopharyngeal swab, presence of viral mutation(s) within the areas targeted by this assay, and inadequate number of viral copies(<138 copies/mL). A negative result must be combined with clinical observations, patient history, and epidemiological information. The expected result is Negative.  Fact Sheet for Patients:  EntrepreneurPulse.com.au  Fact Sheet for Healthcare Providers:  IncredibleEmployment.be  This test is no t yet approved or cleared by the [place] FDA and  has been authorized for detection and/or diagnosis of SARS-CoV-2 by FDA under an Emergency Use Authorization (EUA). This EUA will remain  in effect (meaning this test can be used) for the duration of the COVID-19 declaration under Section 564(b)(1) of the Act, 21 U.S.C.section 360bbb-3(b)(1), unless the authorization is terminated  or revoked sooner.       Influenza A by PCR NEGATIVE NEGATIVE Final   Influenza B by PCR NEGATIVE NEGATIVE Final    Comment: (NOTE) The Xpert Xpress SARS-CoV-2/FLU/RSV plus assay is intended as an aid in the diagnosis of influenza from Nasopharyngeal swab specimens and should not be used as a sole basis for treatment. Nasal washings and aspirates are unacceptable for Xpert Xpress SARS-CoV-2/FLU/RSV testing.  Fact Sheet for Patients: EntrepreneurPulse.com.au  Fact Sheet for Healthcare Providers: IncredibleEmployment.be  This test is not yet approved or cleared by the [place] FDA and has been authorized for detection and/or diagnosis of SARS-CoV-2 by FDA under an Emergency Use Authorization (EUA). This EUA will remain in effect (meaning this test can be used) for the  duration of the COVID-19 declaration under Section 564(b)(1) of the Act, 21 U.S.C. section 360bbb-3(b)(1), unless the authorization is terminated or revoked.  Performed at [facility], [place] [address], [place], [place] [address]   Surgical pcr screen     Status: Abnormal   Collection Time: 04/27/22 12:19 PM   Specimen: Nasal Mucosa; Nasal Swab  Result Value Ref Range Status   MRSA, PCR POSITIVE (A) NEGATIVE Final    Comment: RESULT CALLED TO, READ BACK BY AND VERIFIED WITH: RN N.GAUNTLETT ON [id number] AT 1425 BY E.PARRISH    Staphylococcus aureus POSITIVE (A) NEGATIVE Final    Comment: (NOTE) The Xpert SA Assay (FDA approved for NASAL specimens in patients 10 years of age and older), is one component of a comprehensive surveillance program. It is not intended to diagnose infection nor to guide or monitor treatment. Performed at [facility], [place] [address], [place], [place] [address]   Aerobic/Anaerobic Culture w Gram Stain (surgical/deep wound)     Status: None (Preliminary result)   Collection Time: 04/27/22  2:47 PM   Specimen: Other Source; Body Fluid  Result Value Ref Range Status   Specimen Description WOUND  Final   Special Requests AV GRAFT PT ON ANCEF  Final   Gram Stain   Final    NO WBC SEEN RARE GRAM POSITIVE COCCI IN PAIRS Performed at [facility], [address] [place], [place] [address]    Culture   Final    RARE STAPHYLOCOCCUS AUREUS NO ANAEROBES ISOLATED; CULTURE  IN PROGRESS FOR 5 DAYS    Report Status PENDING  Incomplete   Organism ID, Bacteria STAPHYLOCOCCUS AUREUS  Final      Susceptibility   Staphylococcus aureus - MIC*    CIPROFLOXACIN <=0.5 SENSITIVE Sensitive     ERYTHROMYCIN <=0.25 SENSITIVE Sensitive     GENTAMICIN <=0.5 SENSITIVE Sensitive     OXACILLIN <=0.25 SENSITIVE Sensitive     TETRACYCLINE <=1 SENSITIVE Sensitive     VANCOMYCIN 1 SENSITIVE Sensitive     TRIMETH/SULFA <=10 SENSITIVE Sensitive     CLINDAMYCIN  <=0.25 SENSITIVE Sensitive     RIFAMPIN <=0.5 SENSITIVE Sensitive     Inducible Clindamycin NEGATIVE Sensitive     * RARE STAPHYLOCOCCUS AUREUS  Culture, blood (Routine X 2) w Reflex to ID Panel     Status: None (Preliminary result)   Collection Time: 04/28/22  8:31 AM   Specimen: BLOOD LEFT FOREARM  Result Value Ref Range Status   Specimen Description BLOOD LEFT FOREARM  Final   Special Requests   Final    BOTTLES DRAWN AEROBIC AND ANAEROBIC Blood Culture adequate volume   Culture   Final    NO GROWTH 4 DAYS Performed at [facility], [address] [place], [place] [address]    Report Status PENDING  Incomplete  Culture, blood (Routine X 2) w Reflex to ID Panel     Status: None (Preliminary result)   Collection Time: 04/28/22  8:31 AM   Specimen: BLOOD  Result Value Ref Range Status   Specimen Description BLOOD LEFT ANTECUBITAL  Final   Special Requests IN PEDIATRIC BOTTLE Blood Culture adequate volume  Final   Culture   Final    NO GROWTH 4 DAYS Performed at [facility], [place] [address] [place], [place] [address]    Report Status PENDING  Incomplete         Radiology Studies: No results found.      Scheduled Meds:  [START ON 05/04/2022] cinacalcet  30 mg Oral Q M,W,F-HD   clopidogrel  75 mg Oral Daily   [START ON 05/08/2022] darbepoetin (ARANESP) injection - DIALYSIS  60 mcg Intravenous Q Fri-HD   [START ON 05/04/2022] doxercalciferol  2 mcg Intravenous Q M,W,F-HD   feeding supplement  237 mL Oral BID BM   ferric citrate  420 mg Oral TID WC   heparin  5,000 Units Subcutaneous Q8H   mouth rinse  15 mL Mouth Rinse BID   midodrine  5 mg Oral TID WC   multivitamin  1 tablet Oral QHS   sodium chloride flush  10-40 mL Intracatheter Q12H   Continuous Infusions:  sodium chloride Stopped (05/01/22 2312)    ceFAZolin (ANCEF) IV Stopped (05/01/22 2255)     LOS: 6 days       Kathryn Climes, MD [facility]   To contact the attending  provider between 7A-7P or the covering provider during after hours 7P-7A, please log into the web site [email or web address] and access using universal [place] password for that web site. If you do not have the password, please call the hospital operator.  05/02/2022, 11:48 AM

## 2022-05-03 DIAGNOSIS — A419 Sepsis, unspecified organism: Secondary | ICD-10-CM | POA: Diagnosis not present

## 2022-05-03 DIAGNOSIS — N186 End stage renal disease: Secondary | ICD-10-CM | POA: Diagnosis not present

## 2022-05-03 DIAGNOSIS — T827XXD Infection and inflammatory reaction due to other cardiac and vascular devices, implants and grafts, subsequent encounter: Secondary | ICD-10-CM | POA: Diagnosis not present

## 2022-05-03 DIAGNOSIS — Z992 Dependence on renal dialysis: Secondary | ICD-10-CM | POA: Diagnosis not present

## 2022-05-03 LAB — CULTURE, BLOOD (ROUTINE X 2)
Culture: NO GROWTH
Culture: NO GROWTH
Special Requests: ADEQUATE
Special Requests: ADEQUATE

## 2022-05-03 LAB — CBC
HCT: 25.9 % — ABNORMAL LOW (ref 36.0–46.0)
Hemoglobin: 8.8 g/dL — ABNORMAL LOW (ref 12.0–15.0)
MCH: 32.2 pg (ref 26.0–34.0)
MCHC: 34 g/dL (ref 30.0–36.0)
MCV: 94.9 fL (ref 80.0–100.0)
Platelets: 218 10*3/uL (ref 150–400)
RBC: 2.73 MIL/uL — ABNORMAL LOW (ref 3.87–5.11)
RDW: 16.7 % — ABNORMAL HIGH (ref 11.5–15.5)
WBC: 8.5 10*3/uL (ref 4.0–10.5)
nRBC: 0 % (ref 0.0–0.2)

## 2022-05-03 LAB — RENAL FUNCTION PANEL
Albumin: 2.5 g/dL — ABNORMAL LOW (ref 3.5–5.0)
Anion gap: 12 (ref 5–15)
BUN: 33 mg/dL — ABNORMAL HIGH (ref 8–23)
CO2: 24 mmol/L (ref 22–32)
Calcium: 9.7 mg/dL (ref 8.9–10.3)
Chloride: 93 mmol/L — ABNORMAL LOW (ref 98–111)
Creatinine, Ser: 5.11 mg/dL — ABNORMAL HIGH (ref 0.44–1.00)
GFR, Estimated: 9 mL/min — ABNORMAL LOW (ref >60)
Glucose, Bld: 117 mg/dL — ABNORMAL HIGH (ref 70–99)
Phosphorus: 3 mg/dL (ref 2.5–4.6)
Potassium: 4.2 mmol/L (ref 3.5–5.1)
Sodium: 129 mmol/L — ABNORMAL LOW (ref 135–145)

## 2022-05-03 MED ORDER — CHLORHEXIDINE GLUCONATE CLOTH 2 % EX PADS
6.0000 | MEDICATED_PAD | Freq: Every day | CUTANEOUS | Status: DC
  Administered 2022-05-03 – 2022-05-04 (×2): 6 via TOPICAL

## 2022-05-03 NOTE — TOC Progression Note (Signed)
Transition of Care [facility]) - Progression Note    Patient Details  Name: [name] MRN: [id number] Date of Birth: 1951-07-19  Transition of Care [facility]) CM/SW [place], LCSW Phone Number:[phone] 05/03/2022, 1:44 PM  Clinical Narrative:     Pt's authorization still pending.  TOC team will continue to assist with discharge planning needs. >t Expected Discharge Plan: [place] Barriers to Discharge: Continued Medical Work up  Expected Discharge Plan and Services Expected Discharge Plan: [place] In-house Referral: Clinical Social Work                                             Social Determinants of Health (SDOH) Interventions    Readmission Risk Interventions    11/11/2021    3:49 PM 08/18/2021    3:01 PM  Readmission Risk Prevention Plan  Transportation Screening Complete Complete  Medication Review (RN Care Manager) Complete Complete  PCP or Specialist appointment within 3-5 days of discharge Complete Complete  HRI or Home Care Consult Complete Complete  SW Recovery Care/Counseling Consult Complete Complete  Palliative Care Screening Not Applicable Not Applicable  Skilled Nursing Facility Complete Complete

## 2022-05-03 NOTE — Progress Notes (Addendum)
[facility] Progress Note   Subjective:   Seen in room this AM. Very pleasant, says she is enjoying her breakfast. Denies SOB, CP, dizziness, abdominal pain and nausea.   Objective Vitals:   05/02/22 0741 05/02/22 1930 05/03/22 0343 05/03/22 0737  BP: (!) 113/40 108/82 (!) 113/50 (!) 114/47  Pulse: 81 79 93   Resp: '10 15 15 15  '$ Temp: (!) 97.4 F (36.3 C) 97.8 F (36.6 C) (!) 97.5 F (36.4 C) 97.8 F (36.6 C)  TempSrc: Oral Oral Oral Oral  SpO2: 95% 91% 94%   Weight:   51.2 kg   Height:       Physical Exam General: Alert female in NAD Heart: RRR, no murmurs, rubs or gallops Lungs: CTA bilaterally without wheezing, rhonchi or rales Abdomen: Soft, non-distended, +BS Extremities: No edema b/l upper or lower extremities Dialysis Access:  RUE AVG + bruit  Additional Objective Labs: Basic Metabolic Panel: Recent Labs  Lab 04/29/22 1618 05/01/22 0415 05/02/22 0408  NA 137 135 132*  K 3.4* 3.4* 3.5  CL 99 97* 94*  CO2 '26 28 27  '$ GLUCOSE 110* 99 91  BUN 30* 18 12  CREATININE 4.76* 3.31* 2.76*  CALCIUM 8.7* 9.3 9.3  PHOS 3.9  --   --    Liver Function Tests: Recent Labs  Lab 04/26/22 2040 04/27/22 1105 04/27/22 1958 04/29/22 1618  AST 27 40 144*  --   ALT 16 20 63*  --   ALKPHOS 121 101 189*  --   BILITOT 1.3* 1.1 1.2  --   PROT 6.8 6.9 6.4*  --   ALBUMIN 2.9* 2.9* 3.1* 2.7*   No results for input(s): LIPASE, AMYLASE in the last 168 hours. CBC: Recent Labs  Lab 04/26/22 2040 04/26/22 2054 04/27/22 1105 04/27/22 1352 04/27/22 1958 04/28/22 0410 04/29/22 0449 05/01/22 0415 05/02/22 0408  WBC 11.3*  --  9.0  --  20.3* 16.1* 7.3 6.4 7.3  NEUTROABS 9.8*  --  6.7  --   --   --   --   --   --   HGB 10.5*   < > 11.9*   < > 10.9* 10.3* 8.7* 8.6* 8.3*  HCT 31.1*   < > 36.6   < > 33.0* 31.5* 25.9* 25.2* 25.8*  MCV 96.9  --  97.3  --  97.3 97.8 96.6 96.2 97.0  PLT 184  --  167  --  189 207 147* 154 181   < > = values in this interval not  displayed.   Blood Culture    Component Value Date/Time   SDES BLOOD LEFT FOREARM 04/28/2022 0831   SDES BLOOD LEFT ANTECUBITAL 04/28/2022 0831   SPECREQUEST  04/28/2022 0831    BOTTLES DRAWN AEROBIC AND ANAEROBIC Blood Culture adequate volume   SPECREQUEST IN PEDIATRIC BOTTLE Blood Culture adequate volume 04/28/2022 0831   CULT  04/28/2022 0831    NO GROWTH 5 DAYS Performed at [facility], [place] [address] [place], [place] [address]    CULT  04/28/2022 0831    NO GROWTH 5 DAYS Performed at [facility] [address], [place], [place] [address]    REPTSTATUS 05/03/2022 FINAL 04/28/2022 0831   REPTSTATUS 05/03/2022 FINAL 04/28/2022 0831    Cardiac Enzymes: No results for input(s): CKTOTAL, CKMB, CKMBINDEX, TROPONINI in the last 168 hours. CBG: Recent Labs  Lab 04/27/22 1557 04/27/22 1619 04/27/22 1942 04/28/22 0156 04/28/22 2003  GLUCAP 65* 120* 179* 189* 114*  Iron Studies: No results for input(s): IRON, TIBC, TRANSFERRIN, FERRITIN in the last 72 hours. '@lablastinr3'$ @ Studies/Results: No results found. Medications:  sodium chloride Stopped (05/01/22 2312)    ceFAZolin (ANCEF) IV Stopped (05/01/22 2255)    Chlorhexidine Gluconate Cloth  6 each Topical Daily   [START ON 05/04/2022] cinacalcet  30 mg Oral Q M,W,F-HD   clopidogrel  75 mg Oral Daily   [START ON 05/08/2022] darbepoetin (ARANESP) injection - DIALYSIS  60 mcg Intravenous Q Fri-HD   [START ON 05/04/2022] doxercalciferol  2 mcg Intravenous Q M,W,F-HD   feeding supplement  237 mL Oral BID BM   ferric citrate  420 mg Oral TID WC   heparin  5,000 Units Subcutaneous Q8H   mouth rinse  15 mL Mouth Rinse BID   midodrine  5 mg Oral TID WC   multivitamin  1 tablet Oral QHS   sodium chloride flush  10-40 mL Intracatheter Q12H    Dialysis Orders: [facility]  4h  350/600  45kg   2/2 bath  P2  AVG RUA  Hep none - last HD 5/12, 48.5 > 46.2kg - last Hb 10.8 on 5/08 - mircera 50 q4, last 4/21, due  5/19 - hectorol 2 ug IV tiw - sensipar 30 tiw po  Assessment/Plan: R arm draining wound - 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 AVG is patent and okay to use; stick above the wound per VVS. MSSA sepsis - +blood cx's, rare + graft/tissue for MSSA as well. F/u blood cx's are negative.  Per ID treatment is 4 weeks from 5/16 to 6/13, and on 6/13 she can be transitioned to oral cephalexin 500 mg p.o. for chronic suppression in setting of new graft material placement in infected area. See also ID note.  ESRD - on HD MWF. Was off schedule last week. Next HD Monday.  BP/ volume - low BP's required pressors in ICU, shock resolved. Holding home BP meds and on midodrine 5 tid which is new. Wt's still up but doubt accuracy, looks euvolemic today, edema improved.  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  BMD ckd - CCa and phos are in range. Cont IV vdra and po sensipar and binders.     Anice Paganini, PA-C 05/03/2022, 9:55 AM  [facility] Pager: [phone]  Pt seen, examined and agree w assess/plan as above with additions as indicated.  [facility] 05/03/2022, 9:01 PM

## 2022-05-03 NOTE — Plan of Care (Signed)
  Problem: Clinical Measurements: Goal: Ability to maintain clinical measurements within normal limits will improve Outcome: Progressing   Problem: Health Behavior/Discharge Planning: Goal: Ability to manage health-related needs will improve Outcome: Progressing   Problem: Education: Goal: Knowledge of General Education information will improve Description: Including pain rating scale, medication(s)/side effects and non-pharmacologic comfort measures Outcome: Progressing   Problem: Clinical Measurements: Goal: Will remain free from infection Outcome: Progressing

## 2022-05-03 NOTE — Progress Notes (Signed)
PROGRESS NOTE    AUSTRALIA DROLL  QQI:[id number] DOB: 05-21-1951 DOA: 04/26/2022 PCP: [name], MD   Chief Complaint  Patient presents with   Shortness of Breath   Weakness    Brief Narrative:  [name] is a 71 y.o. F with PMH significant for ESRD on HD, CHF, CAD, HL, HTN who presented to the ED on 5/14 with purulent drainage from the AV fistula on the RUE.  She reported a "bug bite" on the site.   She was admitted to the hospitalist service and started on Cefepime and Vancomycin and taken to the OR for I&D on 5/15.  Peri-operatively pt's blood pressure dropped and she was started on Levophed and admitted to ICU.  5/14 admitted with RUE AV graft infection, started on Vanc/Cefepime 5/15 Taken to OR for graft revision, hypotensive and started on Levophed, PCCM consulted, intubated 5/16 Extubated, on low dose levophed 5/18 transferred to [facility] service.    Assessment & Plan:   Principal Problem:   Acute respiratory failure with hypoxia (HCC) Active Problems:   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HCC)   End-stage renal disease on hemodialysis (HCC)   HTN (hypertension)   Chronic diastolic CHF (congestive heart failure) (HCC)   Anemia of chronic renal failure   Sepsis ([place])   MSSA bacteremia   Septic shock s in the setting of AV graft infection, staph bacteremia  -Present on admission -Requiring pressor support initially, currently weaned off pressor support.  -Blood pressure is stable on midodrine.  AV graft infection, MSSA bacteremia -Status post AVG revision by VVS on 5/15 (small segment resected due to degeneration and new segment AVG replaced). -Per VVS okay to stick above the wound.   - TTE with no clear vegetation. Although there is TR and MR.  -Antibiotic management per ID, continue with IV Ancef.  Duration for treatment is 4 weeks from 5/16 to 6/13 and on 6/13 she can be transitioned to oral cephalexin 500 mg p.o. nightly  Acute respiratory failure with hypoxia -  extubated successfully to [place] - DC supplemental oxygen.  -Encouraged use incentive spirometry and flutter valve   End-stage renal disease -Management per nephrology ,she was dialyzed yesterday to go on Monday Wednesday Friday, her outpatient dialysis schedule. - OK to use new fistula on left forearm per VVS   Anemia of chronic renal disease - Monitor CBC, transfuse as needed.   Hypertension, diastolic CHF -Volume management with dialysis  Difficult IV access -Unable to establish peripheral IV, so we will continue with left IJ for now  Deconditioning/weakness -PT/OT consulted, recommendation for SNF placement    DVT prophylaxis: Heparin Code Status: Full Family Communication: None at bedside Disposition: will need SNF placment  Status is: Inpatient Patient is medically stable for discharge once SNF bed is available.    Consultants:  PCCM VVS Renal    Subjective:  No significant events overnight, she denies any complaints.  Objective: Vitals:   05/02/22 1930 05/03/22 0343 05/03/22 0737 05/03/22 0800  BP: 108/82 (!) 113/50 (!) 114/47 (!) 123/44  Pulse: 79 93  74  Resp: '15 15 15   '$ Temp: 97.8 F (36.6 C) (!) 97.5 F (36.4 C) 97.8 F (36.6 C)   TempSrc: Oral Oral Oral   SpO2: 91% 94%  91%  Weight:  51.2 kg    Height:        Intake/Output Summary (Last 24 hours) at 05/03/2022 1248 Last data filed at 05/03/2022 1042 Gross per 24 hour  Intake 797 ml  Output  0 ml  Net 797 ml   Filed Weights   05/01/22 1707 05/02/22 0342 05/03/22 0343  Weight: 50.4 kg 50.1 kg 51.2 kg    Examination:  Awake Alert, pleasant, no apparent distress. Symmetrical Chest wall movement, Good air movement bilaterally, CTAB RRR,No Gallops,Rubs or new Murmurs, No Parasternal Heave +ve B.Sounds, Abd Soft, No tenderness, No rebound - guarding or rigidity. No Cyanosis, Clubbing or edema, No new Rash or bruise         Data Reviewed: I have personally reviewed following labs and  imaging studies  CBC: Recent Labs  Lab 04/26/22 2040 04/26/22 2054 04/27/22 1105 04/27/22 1352 04/27/22 1958 04/28/22 0410 04/29/22 0449 05/01/22 0415 05/02/22 0408  WBC 11.3*  --  9.0  --  20.3* 16.1* 7.3 6.4 7.3  NEUTROABS 9.8*  --  6.7  --   --   --   --   --   --   HGB 10.5*   < > 11.9*   < > 10.9* 10.3* 8.7* 8.6* 8.3*  HCT 31.1*   < > 36.6   < > 33.0* 31.5* 25.9* 25.2* 25.8*  MCV 96.9  --  97.3  --  97.3 97.8 96.6 96.2 97.0  PLT 184  --  167  --  189 207 147* 154 181   < > = values in this interval not displayed.    Basic Metabolic Panel: Recent Labs  Lab 04/27/22 1105 04/27/22 1352 04/27/22 1958 04/28/22 0410 04/29/22 1618 05/01/22 0415 05/02/22 0408  NA 135   < > 135 134* 137 135 132*  K 5.0   < > 4.8 4.7 3.4* 3.4* 3.5  CL 94*  --  96* 97* 99 97* 94*  CO2 24  --  21* '22 26 28 27  '$ GLUCOSE 80  --  204* 166* 110* 99 91  BUN 44*  --  46* 50* 30* 18 12  CREATININE 6.36*  --  6.40* 6.73* 4.76* 3.31* 2.76*  CALCIUM 8.9  --  8.3* 8.6* 8.7* 9.3 9.3  MG 2.1  --  2.2  --   --   --   --   PHOS  --   --   --   --  3.9  --   --    < > = values in this interval not displayed.    GFR: Estimated Creatinine Clearance: 14.8 mL/min (A) (by C-G formula based on SCr of 2.76 mg/dL (H)).  Liver Function Tests: Recent Labs  Lab 04/26/22 2040 04/27/22 1105 04/27/22 1958 04/29/22 1618  AST 27 40 144*  --   ALT 16 20 63*  --   ALKPHOS 121 101 189*  --   BILITOT 1.3* 1.1 1.2  --   PROT 6.8 6.9 6.4*  --   ALBUMIN 2.9* 2.9* 3.1* 2.7*    CBG: Recent Labs  Lab 04/27/22 1557 04/27/22 1619 04/27/22 1942 04/28/22 0156 04/28/22 2003  GLUCAP 65* 120* 179* 189* 114*     Recent Results (from the past 240 hour(s))  Blood Culture (routine x 2)     Status: Abnormal   Collection Time: 04/26/22  8:40 PM   Specimen: BLOOD  Result Value Ref Range Status   Specimen Description BLOOD  Final   Special Requests   Final    RIGHT EJ BOTTLES DRAWN AEROBIC AND ANAEROBIC Blood  Culture adequate volume   Culture  Setup Time   Final    GRAM POSITIVE COCCI IN BOTH AEROBIC AND ANAEROBIC BOTTLES CRITICAL RESULT  CALLED TO, READ BACK BY AND VERIFIED WITH: PHARMD ALEX 134 [id number] FCP Performed at [facility], [place] [address] [place], [place] [address]    Culture STAPHYLOCOCCUS AUREUS (A)  Final   Report Status 04/29/2022 FINAL  Final   Organism ID, Bacteria STAPHYLOCOCCUS AUREUS  Final      Susceptibility   Staphylococcus aureus - MIC*    CIPROFLOXACIN <=0.5 SENSITIVE Sensitive     ERYTHROMYCIN <=0.25 SENSITIVE Sensitive     GENTAMICIN <=0.5 SENSITIVE Sensitive     OXACILLIN 0.5 SENSITIVE Sensitive     TETRACYCLINE <=1 SENSITIVE Sensitive     VANCOMYCIN 1 SENSITIVE Sensitive     TRIMETH/SULFA <=10 SENSITIVE Sensitive     CLINDAMYCIN <=0.25 SENSITIVE Sensitive     RIFAMPIN <=0.5 SENSITIVE Sensitive     Inducible Clindamycin NEGATIVE Sensitive     * STAPHYLOCOCCUS AUREUS  Blood Culture ID Panel (Reflexed)     Status: Abnormal   Collection Time: 04/26/22  8:40 PM  Result Value Ref Range Status   Enterococcus faecalis NOT DETECTED NOT DETECTED Final   Enterococcus Faecium NOT DETECTED NOT DETECTED Final   Listeria monocytogenes NOT DETECTED NOT DETECTED Final   Staphylococcus species DETECTED (A) NOT DETECTED Final    Comment: CRITICAL RESULT CALLED TO, READ BACK BY AND VERIFIED WITH: PHARMD ALEX L [id number] [id number] FCP    Staphylococcus aureus (BCID) DETECTED (A) NOT DETECTED Final    Comment: CRITICAL RESULT CALLED TO, READ BACK BY AND VERIFIED WITH: PHARMD ALEX L [id number] [id number] FCP    Staphylococcus epidermidis NOT DETECTED NOT DETECTED Final   Staphylococcus lugdunensis NOT DETECTED NOT DETECTED Final   Streptococcus species NOT DETECTED NOT DETECTED Final   Streptococcus agalactiae NOT DETECTED NOT DETECTED Final   Streptococcus pneumoniae NOT DETECTED NOT DETECTED Final   Streptococcus pyogenes NOT DETECTED NOT DETECTED Final   A.calcoaceticus-baumannii  NOT DETECTED NOT DETECTED Final   Bacteroides fragilis NOT DETECTED NOT DETECTED Final   Enterobacterales NOT DETECTED NOT DETECTED Final   Enterobacter cloacae complex NOT DETECTED NOT DETECTED Final   Escherichia coli NOT DETECTED NOT DETECTED Final   Klebsiella aerogenes NOT DETECTED NOT DETECTED Final   Klebsiella oxytoca NOT DETECTED NOT DETECTED Final   Klebsiella pneumoniae NOT DETECTED NOT DETECTED Final   Proteus species NOT DETECTED NOT DETECTED Final   Salmonella species NOT DETECTED NOT DETECTED Final   Serratia marcescens NOT DETECTED NOT DETECTED Final   Haemophilus influenzae NOT DETECTED NOT DETECTED Final   Neisseria meningitidis NOT DETECTED NOT DETECTED Final   Pseudomonas aeruginosa NOT DETECTED NOT DETECTED Final   Stenotrophomonas maltophilia NOT DETECTED NOT DETECTED Final   Candida albicans NOT DETECTED NOT DETECTED Final   Candida auris NOT DETECTED NOT DETECTED Final   Candida glabrata NOT DETECTED NOT DETECTED Final   Candida krusei NOT DETECTED NOT DETECTED Final   Candida parapsilosis NOT DETECTED NOT DETECTED Final   Candida tropicalis NOT DETECTED NOT DETECTED Final   Cryptococcus neoformans/gattii NOT DETECTED NOT DETECTED Final   Meth resistant mecA/C and MREJ NOT DETECTED NOT DETECTED Final    Comment: Performed at [facility], [address] [place], [place] [address]  Resp Panel by RT-PCR (Flu A&B, Covid) Nasopharyngeal Swab     Status: None   Collection Time: 04/26/22  9:35 PM   Specimen: Nasopharyngeal Swab; Nasopharyngeal(NP) swabs in vial transport medium  Result Value Ref Range Status   SARS Coronavirus 2 by RT PCR NEGATIVE NEGATIVE Final  Comment: (NOTE) SARS-CoV-2 target nucleic acids are NOT DETECTED.  The SARS-CoV-2 RNA is generally detectable in upper respiratory specimens during the acute phase of infection. The lowest concentration of SARS-CoV-2 viral copies this assay can detect is 138 copies/mL. A negative result does not  preclude SARS-Cov-2 infection and should not be used as the sole basis for treatment or other patient management decisions. A negative result may occur with  improper specimen collection/handling, submission of specimen other than nasopharyngeal swab, presence of viral mutation(s) within the areas targeted by this assay, and inadequate number of viral copies(<138 copies/mL). A negative result must be combined with clinical observations, patient history, and epidemiological information. The expected result is Negative.  Fact Sheet for Patients:  EntrepreneurPulse.com.au  Fact Sheet for Healthcare Providers:  IncredibleEmployment.be  This test is no t yet approved or cleared by the [place] FDA and  has been authorized for detection and/or diagnosis of SARS-CoV-2 by FDA under an Emergency Use Authorization (EUA). This EUA will remain  in effect (meaning this test can be used) for the duration of the COVID-19 declaration under Section 564(b)(1) of the Act, 21 U.S.C.section 360bbb-3(b)(1), unless the authorization is terminated  or revoked sooner.       Influenza A by PCR NEGATIVE NEGATIVE Final   Influenza B by PCR NEGATIVE NEGATIVE Final    Comment: (NOTE) The Xpert Xpress SARS-CoV-2/FLU/RSV plus assay is intended as an aid in the diagnosis of influenza from Nasopharyngeal swab specimens and should not be used as a sole basis for treatment. Nasal washings and aspirates are unacceptable for Xpert Xpress SARS-CoV-2/FLU/RSV testing.  Fact Sheet for Patients: EntrepreneurPulse.com.au  Fact Sheet for Healthcare Providers: IncredibleEmployment.be  This test is not yet approved or cleared by the [place] FDA and has been authorized for detection and/or diagnosis of SARS-CoV-2 by FDA under an Emergency Use Authorization (EUA). This EUA will remain in effect (meaning this test can be used) for the  duration of the COVID-19 declaration under Section 564(b)(1) of the Act, 21 U.S.C. section 360bbb-3(b)(1), unless the authorization is terminated or revoked.  Performed at [facility], [place] [address] [place], [place] [address]   Surgical pcr screen     Status: Abnormal   Collection Time: 04/27/22 12:19 PM   Specimen: Nasal Mucosa; Nasal Swab  Result Value Ref Range Status   MRSA, PCR POSITIVE (A) NEGATIVE Final    Comment: RESULT CALLED TO, READ BACK BY AND VERIFIED WITH: RN N.GAUNTLETT ON [id number] AT 1425 BY E.PARRISH    Staphylococcus aureus POSITIVE (A) NEGATIVE Final    Comment: (NOTE) The Xpert SA Assay (FDA approved for NASAL specimens in patients 21 years of age and older), is one component of a comprehensive surveillance program. It is not intended to diagnose infection nor to guide or monitor treatment. Performed at [facility], [place] [address] [place], [place] [address]   Aerobic/Anaerobic Culture w Gram Stain (surgical/deep wound)     Status: None   Collection Time: 04/27/22  2:47 PM   Specimen: Other Source; Body Fluid  Result Value Ref Range Status   Specimen Description WOUND  Final   Special Requests AV GRAFT PT ON ANCEF  Final   Gram Stain NO WBC SEEN RARE GRAM POSITIVE COCCI IN PAIRS   Final   Culture   Final    RARE STAPHYLOCOCCUS AUREUS NO ANAEROBES ISOLATED Performed at [facility], [address], [place],  [address]    Report Status 05/02/2022  FINAL  Final   Organism ID, Bacteria STAPHYLOCOCCUS AUREUS  Final      Susceptibility   Staphylococcus aureus - MIC*    CIPROFLOXACIN <=0.5 SENSITIVE Sensitive     ERYTHROMYCIN <=0.25 SENSITIVE Sensitive     GENTAMICIN <=0.5 SENSITIVE Sensitive     OXACILLIN <=0.25 SENSITIVE Sensitive     TETRACYCLINE <=1 SENSITIVE Sensitive     VANCOMYCIN 1 SENSITIVE Sensitive     TRIMETH/SULFA <=10 SENSITIVE Sensitive     CLINDAMYCIN <=0.25 SENSITIVE Sensitive     RIFAMPIN <=0.5 SENSITIVE  Sensitive     Inducible Clindamycin NEGATIVE Sensitive     * RARE STAPHYLOCOCCUS AUREUS  Culture, blood (Routine X 2) w Reflex to ID Panel     Status: None   Collection Time: 04/28/22  8:31 AM   Specimen: BLOOD LEFT FOREARM  Result Value Ref Range Status   Specimen Description BLOOD LEFT FOREARM  Final   Special Requests   Final    BOTTLES DRAWN AEROBIC AND ANAEROBIC Blood Culture adequate volume   Culture   Final    NO GROWTH 5 DAYS Performed at [facility], [address], [place], [place] [address]    Report Status 05/03/2022 FINAL  Final  Culture, blood (Routine X 2) w Reflex to ID Panel     Status: None   Collection Time: 04/28/22  8:31 AM   Specimen: BLOOD  Result Value Ref Range Status   Specimen Description BLOOD LEFT ANTECUBITAL  Final   Special Requests IN PEDIATRIC BOTTLE Blood Culture adequate volume  Final   Culture   Final    NO GROWTH 5 DAYS Performed at [facility], [place] [address], [place], [place] [address]    Report Status 05/03/2022 FINAL  Final         Radiology Studies: No results found.      Scheduled Meds:  Chlorhexidine Gluconate Cloth  6 each Topical Daily   Chlorhexidine Gluconate Cloth  6 each Topical Q0600   [START ON 05/04/2022] cinacalcet  30 mg Oral Q M,W,F-HD   clopidogrel  75 mg Oral Daily   [START ON 05/08/2022] darbepoetin (ARANESP) injection - DIALYSIS  60 mcg Intravenous Q Fri-HD   [START ON 05/04/2022] doxercalciferol  2 mcg Intravenous Q M,W,F-HD   feeding supplement  237 mL Oral BID BM   ferric citrate  420 mg Oral TID WC   heparin  5,000 Units Subcutaneous Q8H   mouth rinse  15 mL Mouth Rinse BID   midodrine  5 mg Oral TID WC   multivitamin  1 tablet Oral QHS   sodium chloride flush  10-40 mL Intracatheter Q12H   Continuous Infusions:  sodium chloride Stopped (05/01/22 2312)    ceFAZolin (ANCEF) IV Stopped (05/01/22 2255)     LOS: 7 days       Phillips Climes, MD [facility]   To contact  the attending provider between 7A-7P or the covering provider during after hours 7P-7A, please log into the web site [email or web address] and access using universal [place] password for that web site. If you do not have the password, please call the hospital operator.  05/03/2022, 12:48 PM

## 2022-05-03 NOTE — Plan of Care (Signed)
  Problem: Education: Goal: Knowledge of General Education information will improve Description: Including pain rating scale, medication(s)/side effects and non-pharmacologic comfort measures Outcome: Progressing   Problem: Health Behavior/Discharge Planning: Goal: Ability to manage health-related needs will improve Outcome: Progressing   Problem: Clinical Measurements: Goal: Ability to maintain clinical measurements within normal limits will improve Outcome: Progressing Goal: Will remain free from infection Outcome: Progressing Goal: Diagnostic test results will improve Outcome: Progressing Goal: Respiratory complications will improve Outcome: Progressing Goal: Cardiovascular complication will be avoided Outcome: Progressing   Problem: Activity: Goal: Risk for activity intolerance will decrease Outcome: Progressing   Problem: Nutrition: Goal: Adequate nutrition will be maintained Outcome: Progressing   Problem: Coping: Goal: Level of anxiety will decrease Outcome: Progressing   Problem: Elimination: Goal: Will not experience complications related to bowel motility Outcome: Progressing   Problem: Pain Managment: Goal: General experience of comfort will improve Outcome: Progressing   Problem: Safety: Goal: Ability to remain free from injury will improve Outcome: Progressing   Problem: Skin Integrity: Goal: Risk for impaired skin integrity will decrease Outcome: Progressing   Problem: Activity: Goal: Ability to tolerate increased activity will improve Outcome: Progressing   Problem: Respiratory: Goal: Ability to maintain a clear airway and adequate ventilation will improve Outcome: Progressing   Problem: Role Relationship: Goal: Method of communication will improve Outcome: Progressing

## 2022-05-03 NOTE — TOC Progression Note (Signed)
Transition of Care [facility]) - Progression Note    Patient Details  Name: [name] MRN: [id number] Date of Birth: September 17, 1951  Transition of Care [facility]) CM/SW [place], LCSW Phone Number:[phone] 05/03/2022, 9:12 AM  Clinical Narrative:     Pt's authorization is still pending.  TOC team will continue to assist with discharge planning needs. Expected Discharge Plan: [place] Barriers to Discharge: Continued Medical Work up  Expected Discharge Plan and Services Expected Discharge Plan: [place] In-house Referral: Clinical Social Work                                             Social Determinants of Health (SDOH) Interventions    Readmission Risk Interventions    11/11/2021    3:49 PM 08/18/2021    3:01 PM  Readmission Risk Prevention Plan  Transportation Screening Complete Complete  Medication Review (RN Care Manager) Complete Complete  PCP or Specialist appointment within 3-5 days of discharge Complete Complete  HRI or Home Care Consult Complete Complete  SW Recovery Care/Counseling Consult Complete Complete  Palliative Care Screening Not Applicable Not Applicable  Skilled Nursing Facility Complete Complete

## 2022-05-04 DIAGNOSIS — J9601 Acute respiratory failure with hypoxia: Secondary | ICD-10-CM | POA: Diagnosis not present

## 2022-05-04 DIAGNOSIS — I10 Essential (primary) hypertension: Secondary | ICD-10-CM | POA: Diagnosis not present

## 2022-05-04 DIAGNOSIS — N186 End stage renal disease: Secondary | ICD-10-CM | POA: Diagnosis not present

## 2022-05-04 DIAGNOSIS — R7881 Bacteremia: Secondary | ICD-10-CM | POA: Diagnosis not present

## 2022-05-04 LAB — BASIC METABOLIC PANEL
Anion gap: 12 (ref 5–15)
BUN: 37 mg/dL — ABNORMAL HIGH (ref 8–23)
CO2: 24 mmol/L (ref 22–32)
Calcium: 9.9 mg/dL (ref 8.9–10.3)
Chloride: 95 mmol/L — ABNORMAL LOW (ref 98–111)
Creatinine, Ser: 5.54 mg/dL — ABNORMAL HIGH (ref 0.44–1.00)
GFR, Estimated: 8 mL/min — ABNORMAL LOW (ref >60)
Glucose, Bld: 92 mg/dL (ref 70–99)
Potassium: 4.4 mmol/L (ref 3.5–5.1)
Sodium: 131 mmol/L — ABNORMAL LOW (ref 135–145)

## 2022-05-04 LAB — CBC
HCT: 26.3 % — ABNORMAL LOW (ref 36.0–46.0)
Hemoglobin: 8.9 g/dL — ABNORMAL LOW (ref 12.0–15.0)
MCH: 32.2 pg (ref 26.0–34.0)
MCHC: 33.8 g/dL (ref 30.0–36.0)
MCV: 95.3 fL (ref 80.0–100.0)
Platelets: 218 10*3/uL (ref 150–400)
RBC: 2.76 MIL/uL — ABNORMAL LOW (ref 3.87–5.11)
RDW: 16.6 % — ABNORMAL HIGH (ref 11.5–15.5)
WBC: 8.5 10*3/uL (ref 4.0–10.5)
nRBC: 0 % (ref 0.0–0.2)

## 2022-05-04 MED ORDER — CEFAZOLIN SODIUM-DEXTROSE 2-4 GM/100ML-% IV SOLN: 2.0000 g | INTRAVENOUS | Status: AC

## 2022-05-04 MED ORDER — MIDODRINE HCL 5 MG PO TABS
5.0000 mg | ORAL_TABLET | Freq: Three times a day (TID) | ORAL | Status: DC
Start: 2022-05-04 — End: 2022-05-22

## 2022-05-04 MED ORDER — ALBUTEROL SULFATE (2.5 MG/3ML) 0.083% IN NEBU: 2.5000 mg | INHALATION_SOLUTION | RESPIRATORY_TRACT | 12 refills | Status: DC | PRN

## 2022-05-04 MED ORDER — POLYETHYLENE GLYCOL 3350 17 G PO PACK: 17.0000 g | PACK | Freq: Every day | ORAL | Status: DC | PRN

## 2022-05-04 MED ORDER — CEPHALEXIN 500 MG PO CAPS: 500.0000 mg | ORAL_CAPSULE | Freq: Every evening | ORAL | 0 refills | Status: DC

## 2022-05-04 MED ORDER — DIPHENHYDRAMINE HCL 25 MG PO CAPS
25.0000 mg | ORAL_CAPSULE | Freq: Once | ORAL | Status: AC
  Administered 2022-05-04: 25 mg via ORAL
  Filled 2022-05-04: qty 1

## 2022-05-04 NOTE — Care Management Important Message (Signed)
Important Message  Patient Details  Name: [name] MRN: [id number] Date of Birth: [birth year]   Medicare Important Message Given:  Yes     Shelda Altes 05/04/2022, 7:43 AM

## 2022-05-04 NOTE — Progress Notes (Signed)
Pt left IJ CL removed per order and protocol. Clean dry intact sterile dsg applied to site. Catheter tip intact and no discharge noted. VSS. Will continue to closely monitor. Delia Heady RN   05/04/22 1458  Vitals  Temp 98.3 F (36.8 C)  Temp Source Axillary  BP (!) 130/49  MAP (mmHg) 71  BP Location Right Leg  BP Method Automatic  Patient Position (if appropriate) Lying  Pulse Rate 63  ECG Heart Rate 79  Resp 15  MEWS COLOR  MEWS Score Color Green  Oxygen Therapy  SpO2 100 %  O2 Device Nasal Cannula  O2 Flow Rate (L/min) 2 L/min  MEWS Score  MEWS Temp 0  MEWS Systolic 0  MEWS Pulse 0  MEWS RR 0  MEWS LOC 0  MEWS Score 0

## 2022-05-04 NOTE — Discharge Summary (Signed)
Physician Discharge Summary  NILZA EAKER ZOX:[id number] DOB: 08-24-1951 DOA: 04/26/2022  PCP: [name], MD  Admit date: 04/26/2022 Discharge date: 05/04/2022  Admitted From: Home Disposition:  SNF  Recommendations for Outpatient Follow-up:  1- Patient to follow-up with ID, appointment scheduled for 6/6 at 9:45 AM, please ensure patient will keep the follow-up appointment and arrange for transport 2-patient to receive IV antibiotics IV Ancef after hemodialysis, test already arranged by nephrology team 3-please start Keflex 500 mg oral every afternoon on 05/26/2022, for which by then she is already done with her IV antibiotics     ID recommendations  Would treat with 4 weeks iv cefazolin then chronically suppress with daily cephalexin   -cefazolin can be given 2 gram post dialysis tiw on dialysis days -duration of cefazolin 4 weeks from 5/16 to 6/13 -on 6/13 she can start cephalexin 500 mg PO nightly   HD center to do Labs weekly while on IV antibiotics: _x_ CBC with differential __ BMP _x_ CMP _x_ CRP __ ESR __ Vancomycin trough __ CK   Fax weekly labs to [phone]   Clinic Follow Up Appt: 6/06 @ 945 with Dr Gale Journey @ [facility] [place], [place], [place] [address] Phone: [phone]    Discharge Condition:Stable CODE STATUS:FULL Diet recommendation: Renal  Brief/Interim Summary: Mckala Pantaleon is a 71 y.o. F with PMH significant for ESRD on HD, CHF, CAD, HL, HTN who presented to the ED on 5/14 with purulent drainage from the AV fistula on the RUE.  She reported a "bug bite" on the site.   She was admitted to the hospitalist service and started on Cefepime and Vancomycin and taken to the OR for I&D on 5/15.  Peri-operatively pt's blood pressure dropped and she was started on Levophed and admitted to ICU.   5/14 admitted with RUE AV graft infection, started on Vanc/Cefepime 5/15 Taken to OR for graft revision, hypotensive and started on  Levophed, PCCM consulted, intubated 5/16 Extubated, on low dose levophed 5/18 transferred to [facility] service.     Septic shock s in the setting of AV graft infection, staph bacteremia  -Present on admission -Admitted to ICU as requiring pressor support initially, currently weaned off pressor support.  -Blood pressure is stable on midodrine.  He will be discharged on midodrine, will hold antihypertensive medications at time of discharge, this can be resumed gradually as needed   AV graft infection, MSSA bacteremia -Status post AVG revision by [facility] on 5/15 (small segment resected due to degeneration and new segment AVG replaced). -Per VVS okay to stick above the wound.   - TTE with no clear vegetation. Although there is TR and MR.  -Antibiotic management per ID, continue with IV Ancef.  Duration for treatment is 4 weeks from 5/16 to 6/13 and on 6/13 she can be transitioned to oral cephalexin 500 mg p.o. nightly   Acute respiratory failure with hypoxia - extubated successfully to [place] -Encouraged use incentive spirometry and flutter valve   End-stage renal disease -Management per nephrology ,she was dialyzed yesterday to go on Monday Wednesday Friday, her outpatient dialysis schedule. - OK to use new fistula on left forearm per VVS   Anemia of chronic renal disease - Monitor CBC, transfuse as needed.   Hypertension, diastolic CHF -Volume management with dialysis -Blood pressure is acceptable currently on midodrine, antihypertensive medication has been discontinued   Difficult IV access -Unable to establish peripheral IV, her left IJ central line has been kept, it  will be discontinued before discharge   Deconditioning/weakness -PT/OT consulted, recommendation for SNF placement     Discharge Diagnoses:  Principal Problem:   Acute respiratory failure with hypoxia ([place]) Active Problems:   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[place])   End-stage renal disease on hemodialysis ([place])   HTN  (hypertension)   Chronic diastolic CHF (congestive heart failure) ([place])   Anemia of chronic renal failure   Sepsis ([place])   MSSA bacteremia    Discharge Instructions  Discharge Instructions     Diet - low sodium heart healthy   Complete by: As directed    Discharge instructions   Complete by: As directed    -cefazolin can be given 2 gram post dialysis tiw on dialysis days -duration of cefazolin 4 weeks from 5/16 to 6/13 -on 6/13 she can start cephalexin 500 mg PO nightly   HD center to do Labs weekly while on IV antibiotics: _x_ CBC with differential __ BMP _x_ CMP _x_ CRP __ ESR __ Vancomycin trough __ CK     Fax weekly labs to [phone]   Clinic Follow Up Appt: 6/06 @ 945 with Dr Gale Journey   @   [facility] [place], [place], [place] [address] Phone: [phone]      Follow with Primary MD [name], MD /SNF physician  Get CBC, CMP,  checked  by Primary MD next visit.    Activity: As tolerated with Full fall precautions use walker/cane & assistance as needed   Disposition SNF   Diet: Renal  On your next visit with your primary care physician please Get Medicines reviewed and adjusted.   Please request your Prim.MD to go over all Hospital Tests and Procedure/Radiological results at the follow up, please get all Hospital records sent to your Prim MD by signing hospital release before you go home.   If you experience worsening of your admission symptoms, develop shortness of breath, life threatening emergency, suicidal or homicidal thoughts you must seek medical attention immediately by calling 911 or calling your MD immediately  if symptoms less severe.  You Must read complete instructions/literature along with all the possible adverse reactions/side effects for all the Medicines you take and that have been prescribed to you. Take any new Medicines after you have completely understood and accpet all the possible adverse reactions/side  effects.   Do not drive, operating heavy machinery, perform activities at heights, swimming or participation in water activities or provide baby sitting services if your were admitted for syncope or siezures until you have seen by Primary MD or a Neurologist and advised to do so again.  Do not drive when taking Pain medications.    Do not take more than prescribed Pain, Sleep and Anxiety Medications  Special Instructions: If you have smoked or chewed Tobacco  in the last 2 yrs please stop smoking, stop any regular Alcohol  and or any Recreational drug use.  Wear Seat belts while driving.   Please note  You were cared for by a hospitalist during your hospital stay. If you have any questions about your discharge medications or the care you received while you were in the hospital after you are discharged, you can call the unit and asked to speak with the hospitalist on call if the hospitalist that took care of you is not available. Once you are discharged, your primary care physician will handle any further medical issues. Please note that NO REFILLS for any discharge medications will be authorized once you  are discharged, as it is imperative that you return to your primary care physician (or establish a relationship with a primary care physician if you do not have one) for your aftercare needs so that they can reassess your need for medications and monitor your lab values.   Increase activity slowly   Complete by: As directed    No wound care   Complete by: As directed       Allergies as of 05/04/2022       Reactions   Sulfa Antibiotics Other (See Comments)   Per patient, both parents allergic-so will not take   Adhesive [tape] Itching        Medication List     STOP taking these medications    aspirin EC 81 MG tablet   carvedilol 6.25 MG tablet Commonly known as: COREG   losartan 50 MG tablet Commonly known as: COZAAR   metoprolol tartrate 25 MG tablet Commonly known as:  LOPRESSOR       TAKE these medications    acetaminophen 500 MG tablet Commonly known as: TYLENOL Take 1 tablet (500 mg total) by mouth every 6 (six) hours as needed. What changed: reasons to take this   albuterol 108 (90 Base) MCG/ACT inhaler Commonly known as: VENTOLIN HFA Inhale 1-2 puffs into the lungs every 6 (six) hours as needed for wheezing or shortness of breath. What changed: Another medication with the same name was added. Make sure you understand how and when to take each.   albuterol (2.5 MG/3ML) 0.083% nebulizer solution Commonly known as: PROVENTIL Take 3 mLs (2.5 mg total) by nebulization every 4 (four) hours as needed for wheezing. What changed: You were already taking a medication with the same name, and this prescription was added. Make sure you understand how and when to take each.   atorvastatin 80 MG tablet Commonly known as: LIPITOR Take 80 mg by mouth daily.   ceFAZolin 2-4 GM/100ML-% IVPB Commonly known as: ANCEF Inject 100 mLs (2 g total) into the vein every Monday, Wednesday, and Friday at 8 PM for 22 days. Patient will receive this after her HD at HD center.   cephALEXin 500 MG capsule Commonly known as: KEFLEX Take 1 capsule (500 mg total) by mouth every evening. Start on 05/26/2022, continue with seen by ID. Start taking on: May 26, 2022   clopidogrel 75 MG tablet Commonly known as: Plavix Take 1 tablet (75 mg total) by mouth daily.   Dialyvite 800 0.8 MG Wafr Take 1 tablet by mouth daily.   escitalopram 10 MG tablet Commonly known as: LEXAPRO Take 10 mg by mouth daily.   feeding supplement Liqd Take 237 mLs by mouth 2 (two) times daily between meals.   folic acid-vitamin b complex-vitamin c-selenium-zinc 3 MG Tabs tablet Take 1 tablet by mouth daily.   loperamide 2 MG capsule Commonly known as: IMODIUM Take 2 mg by mouth daily as needed for diarrhea or loose stools.   midodrine 5 MG tablet Commonly known as: PROAMATINE Take 1  tablet (5 mg total) by mouth 3 (three) times daily with meals.   ondansetron 8 MG tablet Commonly known as: ZOFRAN Take 8 mg by mouth 2 (two) times daily as needed for nausea or vomiting.   pantoprazole 40 MG tablet Commonly known as: PROTONIX Take 1 tablet (40 mg total) by mouth daily.   polyethylene glycol 17 g packet Commonly known as: MIRALAX / GLYCOLAX Take 17 g by mouth daily as needed for mild constipation.  sevelamer 800 MG tablet Commonly known as: RENAGEL Take 1,600 mg by mouth 3 (three) times daily with meals.   zolpidem 10 MG tablet Commonly known as: AMBIEN Take 10 mg by mouth at bedtime as needed for sleep.        Follow-up Information     Jabier Mutton, MD Follow up on 05/19/2022.   Specialty: Infectious Diseases Why: at 9:45 am Contact information: [address] [place] [address] [phone]                Allergies  Allergen Reactions   Sulfa Antibiotics Other (See Comments)    Per patient, both parents allergic-so will not take   Adhesive [Tape] Itching    Consultations: ID Renal  Vascular surgery   Procedures/Studies: CT CHEST WO CONTRAST  Result Date: 04/26/2022 CLINICAL DATA:  Pneumonia, short of breath, hypoxia EXAM: CT CHEST WITHOUT CONTRAST TECHNIQUE: Multidetector CT imaging of the chest was performed following the standard protocol without IV contrast. RADIATION DOSE REDUCTION: This exam was performed according to the departmental dose-optimization program which includes automated exposure control, adjustment of the mA and/or kV according to patient size and/or use of iterative reconstruction technique. COMPARISON:  11/20/2019 FINDINGS: Cardiovascular: Unenhanced imaging of the heart demonstrates cardiomegaly without significant pericardial effusion. 4.1 cm ascending thoracic aortic aneurysm is unchanged. Evaluation of the vascular lumen is limited without IV contrast. There is extensive atherosclerosis of the aorta  and coronary vasculature. Mediastinum/Nodes: No enlarged mediastinal or axillary lymph nodes. Thyroid gland, trachea, and esophagus demonstrate no significant findings. Lungs/Pleura: Trace right pleural effusion. Hypoventilatory changes are seen at the lung bases, right greater than left. Chronic areas of subpleural scarring are seen bilaterally. Likely aspirated barium within the dependent aspects of the lower lobes, right greater than left, stable. No pneumothorax. Central airways are patent. Upper Abdomen: No acute abnormality.  Severe atherosclerosis. Musculoskeletal: Diffuse changes of renal osteodystrophy. No acute or destructive bony lesions. Patient is cachectic. Reconstructed images demonstrate no additional findings. IMPRESSION: 1. Trace right pleural effusion. 2. Minimal dependent atelectasis within the lower lobes. No acute airspace disease. 3. Stable cardiomegaly without significant pericardial effusion. 4. 4.1 cm ascending thoracic aortic aneurysm, stable. Recommend annual imaging followup by CTA or MRA. This recommendation follows 2010 ACCF/AHA/AATS/ACR/ASA/SCA/SCAI/SIR/STS/SVM Guidelines for the Diagnosis and Management of Patients with Thoracic Aortic Disease. Circulation. 2010; 121: N027-O536. Aortic aneurysm NOS (ICD10-I71.9) 5.  Aortic Atherosclerosis (ICD10-I70.0). Electronically Signed   By: Randa Ngo M.D.   On: 04/26/2022 23:21   DG CHEST PORT 1 VIEW  Result Date: 04/27/2022 CLINICAL DATA:  Central line placement. EXAM: PORTABLE CHEST 1 VIEW COMPARISON:  Radiographs dated Apr 26, 2022 FINDINGS: The heart is enlarged. Atherosclerotic calcification of the aortic arch. Mild pulmonary vascular congestion. Lungs are clear without evidence of focal consolidation or large pleural effusion. Endotracheal tube with distal tip approximately 5 cm above the carina. Left IJ access central line with distal tip at the junction of the SVC and innominate vein. IMPRESSION: 1. Stable cardiomegaly with  mild pulmonary vascular congestion. 2. No focal consolidation or large pleural effusion. 3. Left IJ access central line with distal tip at the junction of the SVC and innominate vein. Electronically Signed   By: Keane Police D.O.   On: 04/27/2022 16:50   DG Chest Port 1 View  Result Date: 04/26/2022 CLINICAL DATA:  Short of breath, hypoxia EXAM: PORTABLE CHEST 1 VIEW COMPARISON:  02/08/2022 FINDINGS: Single frontal view of the chest demonstrate stable marked  enlargement of the cardiac silhouette. Stable ectasia and atherosclerosis of the thoracic aorta. Chronic central vascular congestion without acute airspace disease, effusion, or pneumothorax. No acute bony abnormality. IMPRESSION: 1. Chronic cardiomegaly and central vascular congestion. No acute airspace disease. Electronically Signed   By: Randa Ngo M.D.   On: 04/26/2022 20:19   ECHOCARDIOGRAM LIMITED  Result Date: 04/28/2022    ECHOCARDIOGRAM LIMITED REPORT   Patient Name:   GABRILLE KILBRIDE Date of Exam: 04/28/2022 Medical Rec #:  [id number]         Height:       62.0 in Accession #:    [id number]        Weight:       101.0 lb Date of Birth:  1951-02-25         BSA:          1.430 m Patient Age:    52 years          BP:           106/31 mmHg Patient Gender: F                 HR:           64 bpm. Exam Location:  Inpatient Procedure: Cardiac Doppler, Color Doppler and Limited Echo Indications:    Bacteremia  History:        Patient has prior history of Echocardiogram examinations, most                 recent 02/22/2021. CHF; Risk Factors:Hypertension.  Sonographer:    Joette Catching RCS Referring Phys: [id number] [place]  1. No left ventricular thrombus is seen. Left ventricular ejection fraction, by estimation, is 50 to 55%. The left ventricle has low normal function. The left ventricle demonstrates regional wall motion abnormalities (see scoring diagram/findings for description). There is moderate hypokinesis of the left ventricular,  entire inferolateral wall.  2. The right ventricular size is moderately enlarged. There is severely elevated pulmonary artery systolic pressure.  3. Left atrial size was moderately dilated.  4. Right atrial size was moderately dilated.  5. Mild to moderate mitral valve regurgitation. No evidence of mitral stenosis.  6. Tricuspid valve regurgitation is moderate to severe.  7. The aortic valve is tricuspid. There is moderate calcification of the aortic valve. There is moderate thickening of the aortic valve. Aortic valve regurgitation is trivial. Aortic valve sclerosis/calcification is present, without any evidence of aortic stenosis.  8. The inferior vena cava is dilated in size with <50% respiratory variability, suggesting right atrial pressure of 15 mmHg. Conclusion(s)/Recommendation(s): No evidence of valvular vegetations on this transthoracic echocardiogram. Consider a transesophageal echocardiogram to exclude infective endocarditis if clinically indicated. FINDINGS  Left Ventricle: No left ventricular thrombus is seen. Left ventricular ejection fraction, by estimation, is 50 to 55%. The left ventricle has low normal function. The left ventricle demonstrates regional wall motion abnormalities. Moderate hypokinesis of the left ventricular, entire inferolateral wall. The left ventricular internal cavity size was normal in size. There is no left ventricular hypertrophy. Right Ventricle: The right ventricular size is moderately enlarged. There is severely elevated pulmonary artery systolic pressure. The tricuspid regurgitant velocity is 3.69 m/s, and with an assumed right atrial pressure of 15 mmHg, the estimated right ventricular systolic pressure is 82.5 mmHg. Left Atrium: Left atrial size was moderately dilated. Right Atrium: Right atrial size was moderately dilated. Pericardium: There is no evidence of pericardial effusion. Mitral Valve: Mild to moderate mitral  annular calcification. Mild to moderate mitral valve  regurgitation, with centrally-directed jet. No evidence of mitral valve stenosis. Tricuspid Valve: The tricuspid valve is normal in structure. Tricuspid valve regurgitation is moderate to severe. Aortic Valve: The aortic valve is tricuspid. There is moderate calcification of the aortic valve. There is moderate thickening of the aortic valve. Aortic valve regurgitation is trivial. Aortic regurgitation PHT measures 585 msec. Aortic valve sclerosis/calcification is present, without any evidence of aortic stenosis. Aortic valve peak gradient measures 13.7 mmHg. Pulmonic Valve: The pulmonic valve was not well visualized. Pulmonic valve regurgitation is not visualized. Venous: The inferior vena cava is dilated in size with less than 50% respiratory variability, suggesting right atrial pressure of 15 mmHg. The inferior vena cava and the hepatic vein show a pattern of systolic flow reversal, suggestive of tricuspid regurgitation. IAS/Shunts: No atrial level shunt detected by color flow Doppler. LEFT VENTRICLE PLAX 2D LVIDd:         4.97 cm LVIDs:         3.68 cm LV PW:         0.80 cm LV IVS:        0.70 cm  LV Volumes (MOD) LV vol d, MOD A2C: 126.0 ml LV vol d, MOD A4C: 76.1 ml LV vol s, MOD A2C: 69.1 ml LV vol s, MOD A4C: 35.6 ml LV SV MOD A2C:     56.9 ml LV SV MOD A4C:     76.1 ml LV SV MOD BP:      50.1 ml RIGHT VENTRICLE TAPSE (M-mode): 2.3 cm AORTIC VALVE AV Vmax:      185.00 cm/s AV Peak Grad: 13.7 mmHg LVOT Vmax:    113.00 cm/s AI PHT:       585 msec MR Peak grad:   131.3 mmHg   TRICUSPID VALVE MR Vmax:        573.00 cm/s  TR Peak grad:   54.5 mmHg MR PISA:        1.01 cm     TR Vmax:        369.00 cm/s MR PISA Radius: 0.40 cm Mihai Croitoru MD Electronically signed by Sanda Klein MD Signature Date/Time: 04/28/2022/12:56:08 PM    Final       Subjective:  No significant events overnight, she denies any complaints Discharge Exam: Vitals:   05/04/22 1130 05/04/22 1222  BP: (!) 113/33 (!) 111/40  Pulse:  76 78  Resp: 14 16  Temp: 97.6 F (36.4 C) 98.1 F (36.7 C)  SpO2: 100% 99%   Vitals:   05/04/22 1100 05/04/22 1110 05/04/22 1130 05/04/22 1222  BP: (!) 112/31 (!) 112/43 (!) 113/33 (!) 111/40  Pulse:  77 76 78  Resp: 14 14 14 16   Temp:   97.6 F (36.4 C) 98.1 F (36.7 C)  TempSrc:   Oral Oral  SpO2:   100% 99%  Weight:   48.2 kg   Height:        General: Pt is alert, awake, not in acute distress, frail Cardiovascular: RRR, S1/S2 +, no rubs, no gallops Respiratory: CTA bilaterally, no wheezing, no rhonchi Abdominal: Soft, NT, ND, bowel sounds + Extremities: no edema, no cyanosis    The results of significant diagnostics from this hospitalization (including imaging, microbiology, ancillary and laboratory) are listed below for reference.     Microbiology: Recent Results (from the past 240 hour(s))  Blood Culture (routine x 2)     Status: Abnormal   Collection Time: 04/26/22  8:40  PM   Specimen: BLOOD  Result Value Ref Range Status   Specimen Description BLOOD  Final   Special Requests   Final    RIGHT EJ BOTTLES DRAWN AEROBIC AND ANAEROBIC Blood Culture adequate volume   Culture  Setup Time   Final    GRAM POSITIVE COCCI IN BOTH AEROBIC AND ANAEROBIC BOTTLES CRITICAL RESULT CALLED TO, READ BACK BY AND VERIFIED WITH: PHARMD ALEX 134 [id number] FCP Performed at [facility], [name] [address], [place], [place] [address]    Culture STAPHYLOCOCCUS AUREUS (A)  Final   Report Status 04/29/2022 FINAL  Final   Organism ID, Bacteria STAPHYLOCOCCUS AUREUS  Final      Susceptibility   Staphylococcus aureus - MIC*    CIPROFLOXACIN <=0.5 SENSITIVE Sensitive     ERYTHROMYCIN <=0.25 SENSITIVE Sensitive     GENTAMICIN <=0.5 SENSITIVE Sensitive     OXACILLIN 0.5 SENSITIVE Sensitive     TETRACYCLINE <=1 SENSITIVE Sensitive     VANCOMYCIN 1 SENSITIVE Sensitive     TRIMETH/SULFA <=10 SENSITIVE Sensitive     CLINDAMYCIN <=0.25 SENSITIVE Sensitive     RIFAMPIN <=0.5 SENSITIVE  Sensitive     Inducible Clindamycin NEGATIVE Sensitive     * STAPHYLOCOCCUS AUREUS  Blood Culture ID Panel (Reflexed)     Status: Abnormal   Collection Time: 04/26/22  8:40 PM  Result Value Ref Range Status   Enterococcus faecalis NOT DETECTED NOT DETECTED Final   Enterococcus Faecium NOT DETECTED NOT DETECTED Final   Listeria monocytogenes NOT DETECTED NOT DETECTED Final   Staphylococcus species DETECTED (A) NOT DETECTED Final    Comment: CRITICAL RESULT CALLED TO, READ BACK BY AND VERIFIED WITH: PHARMD ALEX L [id number] FCP    Staphylococcus aureus (BCID) DETECTED (A) NOT DETECTED Final    Comment: CRITICAL RESULT CALLED TO, READ BACK BY AND VERIFIED WITH: PHARMD ALEX L [id number] FCP    Staphylococcus epidermidis NOT DETECTED NOT DETECTED Final   Staphylococcus lugdunensis NOT DETECTED NOT DETECTED Final   Streptococcus species NOT DETECTED NOT DETECTED Final   Streptococcus agalactiae NOT DETECTED NOT DETECTED Final   Streptococcus pneumoniae NOT DETECTED NOT DETECTED Final   Streptococcus pyogenes NOT DETECTED NOT DETECTED Final   A.calcoaceticus-baumannii NOT DETECTED NOT DETECTED Final   Bacteroides fragilis NOT DETECTED NOT DETECTED Final   Enterobacterales NOT DETECTED NOT DETECTED Final   Enterobacter cloacae complex NOT DETECTED NOT DETECTED Final   Escherichia coli NOT DETECTED NOT DETECTED Final   Klebsiella aerogenes NOT DETECTED NOT DETECTED Final   Klebsiella oxytoca NOT DETECTED NOT DETECTED Final   Klebsiella pneumoniae NOT DETECTED NOT DETECTED Final   Proteus species NOT DETECTED NOT DETECTED Final   Salmonella species NOT DETECTED NOT DETECTED Final   Serratia marcescens NOT DETECTED NOT DETECTED Final   Haemophilus influenzae NOT DETECTED NOT DETECTED Final   Neisseria meningitidis NOT DETECTED NOT DETECTED Final   Pseudomonas aeruginosa NOT DETECTED NOT DETECTED Final   Stenotrophomonas maltophilia NOT DETECTED NOT DETECTED Final   Candida albicans NOT  DETECTED NOT DETECTED Final   Candida auris NOT DETECTED NOT DETECTED Final   Candida glabrata NOT DETECTED NOT DETECTED Final   Candida krusei NOT DETECTED NOT DETECTED Final   Candida parapsilosis NOT DETECTED NOT DETECTED Final   Candida tropicalis NOT DETECTED NOT DETECTED Final   Cryptococcus neoformans/gattii NOT DETECTED NOT DETECTED Final   Meth resistant mecA/C and MREJ NOT DETECTED NOT DETECTED Final    Comment: Performed at [facility]  Lab, [address] [place], [place] [address]  Resp Panel by RT-PCR (Flu A&B, Covid) Nasopharyngeal Swab     Status: None   Collection Time: 04/26/22  9:35 PM   Specimen: Nasopharyngeal Swab; Nasopharyngeal(NP) swabs in vial transport medium  Result Value Ref Range Status   SARS Coronavirus 2 by RT PCR NEGATIVE NEGATIVE Final    Comment: (NOTE) SARS-CoV-2 target nucleic acids are NOT DETECTED.  The SARS-CoV-2 RNA is generally detectable in upper respiratory specimens during the acute phase of infection. The lowest concentration of SARS-CoV-2 viral copies this assay can detect is 138 copies/mL. A negative result does not preclude SARS-Cov-2 infection and should not be used as the sole basis for treatment or other patient management decisions. A negative result may occur with  improper specimen collection/handling, submission of specimen other than nasopharyngeal swab, presence of viral mutation(s) within the areas targeted by this assay, and inadequate number of viral copies(<138 copies/mL). A negative result must be combined with clinical observations, patient history, and epidemiological information. The expected result is Negative.  Fact Sheet for Patients:  EntrepreneurPulse.com.au  Fact Sheet for Healthcare Providers:  IncredibleEmployment.be  This test is no t yet approved or cleared by the [place] FDA and  has been authorized for detection and/or diagnosis of SARS-CoV-2 by FDA under an  Emergency Use Authorization (EUA). This EUA will remain  in effect (meaning this test can be used) for the duration of the COVID-19 declaration under Section 564(b)(1) of the Act, 21 U.S.C.section 360bbb-3(b)(1), unless the authorization is terminated  or revoked sooner.       Influenza A by PCR NEGATIVE NEGATIVE Final   Influenza B by PCR NEGATIVE NEGATIVE Final    Comment: (NOTE) The Xpert Xpress SARS-CoV-2/FLU/RSV plus assay is intended as an aid in the diagnosis of influenza from Nasopharyngeal swab specimens and should not be used as a sole basis for treatment. Nasal washings and aspirates are unacceptable for Xpert Xpress SARS-CoV-2/FLU/RSV testing.  Fact Sheet for Patients: EntrepreneurPulse.com.au  Fact Sheet for Healthcare Providers: IncredibleEmployment.be  This test is not yet approved or cleared by the [place] FDA and has been authorized for detection and/or diagnosis of SARS-CoV-2 by FDA under an Emergency Use Authorization (EUA). This EUA will remain in effect (meaning this test can be used) for the duration of the COVID-19 declaration under Section 564(b)(1) of the Act, 21 U.S.C. section 360bbb-3(b)(1), unless the authorization is terminated or revoked.  Performed at [facility], [place] [address], [place], [place] [address]   Surgical pcr screen     Status: Abnormal   Collection Time: 04/27/22 12:19 PM   Specimen: Nasal Mucosa; Nasal Swab  Result Value Ref Range Status   MRSA, PCR POSITIVE (A) NEGATIVE Final    Comment: RESULT CALLED TO, READ BACK BY AND VERIFIED WITH: RN N.GAUNTLETT ON [id number] AT 1425 BY E.PARRISH    Staphylococcus aureus POSITIVE (A) NEGATIVE Final    Comment: (NOTE) The Xpert SA Assay (FDA approved for NASAL specimens in patients 50 years of age and older), is one component of a comprehensive surveillance program. It is not intended to diagnose infection nor to guide or monitor  treatment. Performed at [facility], [place] [address], [place], [place] [address]   Aerobic/Anaerobic Culture w Gram Stain (surgical/deep wound)     Status: None   Collection Time: 04/27/22  2:47 PM   Specimen: Other Source; Body Fluid  Result Value Ref Range Status   Specimen Description WOUND  Final   Special Requests AV GRAFT PT ON ANCEF  Final   Gram Stain NO WBC SEEN RARE GRAM POSITIVE COCCI IN PAIRS   Final   Culture   Final    RARE STAPHYLOCOCCUS AUREUS NO ANAEROBES ISOLATED Performed at [facility], [address] [place], [place] [address]    Report Status 05/02/2022 FINAL  Final   Organism ID, Bacteria STAPHYLOCOCCUS AUREUS  Final      Susceptibility   Staphylococcus aureus - MIC*    CIPROFLOXACIN <=0.5 SENSITIVE Sensitive     ERYTHROMYCIN <=0.25 SENSITIVE Sensitive     GENTAMICIN <=0.5 SENSITIVE Sensitive     OXACILLIN <=0.25 SENSITIVE Sensitive     TETRACYCLINE <=1 SENSITIVE Sensitive     VANCOMYCIN 1 SENSITIVE Sensitive     TRIMETH/SULFA <=10 SENSITIVE Sensitive     CLINDAMYCIN <=0.25 SENSITIVE Sensitive     RIFAMPIN <=0.5 SENSITIVE Sensitive     Inducible Clindamycin NEGATIVE Sensitive     * RARE STAPHYLOCOCCUS AUREUS  Culture, blood (Routine X 2) w Reflex to ID Panel     Status: None   Collection Time: 04/28/22  8:31 AM   Specimen: BLOOD LEFT FOREARM  Result Value Ref Range Status   Specimen Description BLOOD LEFT FOREARM  Final   Special Requests   Final    BOTTLES DRAWN AEROBIC AND ANAEROBIC Blood Culture adequate volume   Culture   Final    NO GROWTH 5 DAYS Performed at [facility], [address], [name], [place] [address]    Report Status 05/03/2022 FINAL  Final  Culture, blood (Routine X 2) w Reflex to ID Panel     Status: None   Collection Time: 04/28/22  8:31 AM   Specimen: BLOOD  Result Value Ref Range Status   Specimen Description BLOOD LEFT ANTECUBITAL  Final   Special Requests IN PEDIATRIC BOTTLE Blood Culture adequate  volume  Final   Culture   Final    NO GROWTH 5 DAYS Performed at [facility], [place] [address] [place], [place] [address]    Report Status 05/03/2022 FINAL  Final     Labs: BNP (last 3 results) Recent Labs    11/08/21 0042 11/18/21 0800 02/02/22 0152  BNP >4,500.0* >4,500.0* >7,014.1*   Basic Metabolic Panel: Recent Labs  Lab 04/27/22 1958 04/28/22 0410 04/29/22 1618 05/01/22 0415 05/02/22 0408 05/03/22 2035 05/04/22 0443  NA 135   < > 137 135 132* 129* 131*  K 4.8   < > 3.4* 3.4* 3.5 4.2 4.4  CL 96*   < > 99 97* 94* 93* 95*  CO2 21*   < > 26 28 27 24 24   GLUCOSE 204*   < > 110* 99 91 117* 92  BUN 46*   < > 30* 18 12 33* 37*  CREATININE 6.40*   < > 4.76* 3.31* 2.76* 5.11* 5.54*  CALCIUM 8.3*   < > 8.7* 9.3 9.3 9.7 9.9  MG 2.2  --   --   --   --   --   --   PHOS  --   --  3.9  --   --  3.0  --    < > = values in this interval not displayed.   Liver Function Tests: Recent Labs  Lab 04/27/22 1958 04/29/22 1618 05/03/22 2035  AST 144*  --   --   ALT 63*  --   --   ALKPHOS 189*  --   --  BILITOT 1.2  --   --   PROT 6.4*  --   --   ALBUMIN 3.1* 2.7* 2.5*   No results for input(s): LIPASE, AMYLASE in the last 168 hours. No results for input(s): AMMONIA in the last 168 hours. CBC: Recent Labs  Lab 04/29/22 0449 05/01/22 0415 05/02/22 0408 05/03/22 2035 05/04/22 0443  WBC 7.3 6.4 7.3 8.5 8.5  HGB 8.7* 8.6* 8.3* 8.8* 8.9*  HCT 25.9* 25.2* 25.8* 25.9* 26.3*  MCV 96.6 96.2 97.0 94.9 95.3  PLT 147* 154 181 218 218   Cardiac Enzymes: No results for input(s): CKTOTAL, CKMB, CKMBINDEX, TROPONINI in the last 168 hours. BNP: Invalid input(s): POCBNP CBG: Recent Labs  Lab 04/27/22 1557 04/27/22 1619 04/27/22 1942 04/28/22 0156 04/28/22 2003  GLUCAP 65* 120* 179* 189* 114*   D-Dimer No results for input(s): DDIMER in the last 72 hours. Hgb A1c No results for input(s): HGBA1C in the last 72 hours. Lipid Profile No results for input(s): CHOL,  HDL, LDLCALC, TRIG, CHOLHDL, LDLDIRECT in the last 72 hours. Thyroid function studies No results for input(s): TSH, T4TOTAL, T3FREE, THYROIDAB in the last 72 hours.  Invalid input(s): FREET3 Anemia work up No results for input(s): VITAMINB12, FOLATE, FERRITIN, TIBC, IRON, RETICCTPCT in the last 72 hours. Urinalysis    Component Value Date/Time   COLORURINE YELLOW 01/30/2011 1648   APPEARANCEUR CLOUDY (A) 01/30/2011 1648   LABSPEC 1.016 01/30/2011 1648   PHURINE 5.0 01/30/2011 1648   GLUCOSEU NEGATIVE 10/14/2009 0747   HGBUR SMALL (A) 01/30/2011 1648   BILIRUBINUR NEGATIVE 01/30/2011 1648   KETONESUR NEGATIVE 01/30/2011 1648   PROTEINUR 100 (A) 01/30/2011 1648   UROBILINOGEN 0.2 01/30/2011 1648   NITRITE NEGATIVE 01/30/2011 1648   LEUKOCYTESUR SMALL (A) 01/30/2011 1648   Sepsis Labs Invalid input(s): PROCALCITONIN,  WBC,  LACTICIDVEN Microbiology Recent Results (from the past 240 hour(s))  Blood Culture (routine x 2)     Status: Abnormal   Collection Time: 04/26/22  8:40 PM   Specimen: BLOOD  Result Value Ref Range Status   Specimen Description BLOOD  Final   Special Requests   Final    RIGHT EJ BOTTLES DRAWN AEROBIC AND ANAEROBIC Blood Culture adequate volume   Culture  Setup Time   Final    GRAM POSITIVE COCCI IN BOTH AEROBIC AND ANAEROBIC BOTTLES CRITICAL RESULT CALLED TO, READ BACK BY AND VERIFIED WITH: PHARMD ALEX 134 [id number] FCP Performed at [facility], [place] [address], [place], [place] [address]    Culture STAPHYLOCOCCUS AUREUS (A)  Final   Report Status 04/29/2022 FINAL  Final   Organism ID, Bacteria STAPHYLOCOCCUS AUREUS  Final      Susceptibility   Staphylococcus aureus - MIC*    CIPROFLOXACIN <=0.5 SENSITIVE Sensitive     ERYTHROMYCIN <=0.25 SENSITIVE Sensitive     GENTAMICIN <=0.5 SENSITIVE Sensitive     OXACILLIN 0.5 SENSITIVE Sensitive     TETRACYCLINE <=1 SENSITIVE Sensitive     VANCOMYCIN 1 SENSITIVE Sensitive     TRIMETH/SULFA <=10 SENSITIVE  Sensitive     CLINDAMYCIN <=0.25 SENSITIVE Sensitive     RIFAMPIN <=0.5 SENSITIVE Sensitive     Inducible Clindamycin NEGATIVE Sensitive     * STAPHYLOCOCCUS AUREUS  Blood Culture ID Panel (Reflexed)     Status: Abnormal   Collection Time: 04/26/22  8:40 PM  Result Value Ref Range Status   Enterococcus faecalis NOT DETECTED NOT DETECTED Final   Enterococcus Faecium NOT DETECTED NOT DETECTED Final   Listeria  monocytogenes NOT DETECTED NOT DETECTED Final   Staphylococcus species DETECTED (A) NOT DETECTED Final    Comment: CRITICAL RESULT CALLED TO, READ BACK BY AND VERIFIED WITH: PHARMD ALEX L [id number] [id number] FCP    Staphylococcus aureus (BCID) DETECTED (A) NOT DETECTED Final    Comment: CRITICAL RESULT CALLED TO, READ BACK BY AND VERIFIED WITH: PHARMD ALEX L [phone] [id number] FCP    Staphylococcus epidermidis NOT DETECTED NOT DETECTED Final   Staphylococcus lugdunensis NOT DETECTED NOT DETECTED Final   Streptococcus species NOT DETECTED NOT DETECTED Final   Streptococcus agalactiae NOT DETECTED NOT DETECTED Final   Streptococcus pneumoniae NOT DETECTED NOT DETECTED Final   Streptococcus pyogenes NOT DETECTED NOT DETECTED Final   A.calcoaceticus-baumannii NOT DETECTED NOT DETECTED Final   Bacteroides fragilis NOT DETECTED NOT DETECTED Final   Enterobacterales NOT DETECTED NOT DETECTED Final   Enterobacter cloacae complex NOT DETECTED NOT DETECTED Final   Escherichia coli NOT DETECTED NOT DETECTED Final   Klebsiella aerogenes NOT DETECTED NOT DETECTED Final   Klebsiella oxytoca NOT DETECTED NOT DETECTED Final   Klebsiella pneumoniae NOT DETECTED NOT DETECTED Final   Proteus species NOT DETECTED NOT DETECTED Final   Salmonella species NOT DETECTED NOT DETECTED Final   Serratia marcescens NOT DETECTED NOT DETECTED Final   Haemophilus influenzae NOT DETECTED NOT DETECTED Final   Neisseria meningitidis NOT DETECTED NOT DETECTED Final   Pseudomonas aeruginosa NOT DETECTED NOT DETECTED Final    Stenotrophomonas maltophilia NOT DETECTED NOT DETECTED Final   Candida albicans NOT DETECTED NOT DETECTED Final   Candida auris NOT DETECTED NOT DETECTED Final   Candida glabrata NOT DETECTED NOT DETECTED Final   Candida krusei NOT DETECTED NOT DETECTED Final   Candida parapsilosis NOT DETECTED NOT DETECTED Final   Candida tropicalis NOT DETECTED NOT DETECTED Final   Cryptococcus neoformans/gattii NOT DETECTED NOT DETECTED Final   Meth resistant mecA/C and MREJ NOT DETECTED NOT DETECTED Final    Comment: Performed at [facility], [address] [place], [place] [address]  Resp Panel by RT-PCR (Flu A&B, Covid) Nasopharyngeal Swab     Status: None   Collection Time: 04/26/22  9:35 PM   Specimen: Nasopharyngeal Swab; Nasopharyngeal(NP) swabs in vial transport medium  Result Value Ref Range Status   SARS Coronavirus 2 by RT PCR NEGATIVE NEGATIVE Final    Comment: (NOTE) SARS-CoV-2 target nucleic acids are NOT DETECTED.  The SARS-CoV-2 RNA is generally detectable in upper respiratory specimens during the acute phase of infection. The lowest concentration of SARS-CoV-2 viral copies this assay can detect is 138 copies/mL. A negative result does not preclude SARS-Cov-2 infection and should not be used as the sole basis for treatment or other patient management decisions. A negative result may occur with  improper specimen collection/handling, submission of specimen other than nasopharyngeal swab, presence of viral mutation(s) within the areas targeted by this assay, and inadequate number of viral copies(<138 copies/mL). A negative result must be combined with clinical observations, patient history, and epidemiological information. The expected result is Negative.  Fact Sheet for Patients:  EntrepreneurPulse.com.au  Fact Sheet for Healthcare Providers:  IncredibleEmployment.be  This test is no t yet approved or cleared by the [place] FDA  and  has been authorized for detection and/or diagnosis of SARS-CoV-2 by FDA under an Emergency Use Authorization (EUA). This EUA will remain  in effect (meaning this test can be used) for the duration of the COVID-19 declaration under Section 564(b)(1) of the Act, 21 U.S.C.section 360bbb-3(b)(1),  unless the authorization is terminated  or revoked sooner.       Influenza A by PCR NEGATIVE NEGATIVE Final   Influenza B by PCR NEGATIVE NEGATIVE Final    Comment: (NOTE) The Xpert Xpress SARS-CoV-2/FLU/RSV plus assay is intended as an aid in the diagnosis of influenza from Nasopharyngeal swab specimens and should not be used as a sole basis for treatment. Nasal washings and aspirates are unacceptable for Xpert Xpress SARS-CoV-2/FLU/RSV testing.  Fact Sheet for Patients: EntrepreneurPulse.com.au  Fact Sheet for Healthcare Providers: IncredibleEmployment.be  This test is not yet approved or cleared by the [place] FDA and has been authorized for detection and/or diagnosis of SARS-CoV-2 by FDA under an Emergency Use Authorization (EUA). This EUA will remain in effect (meaning this test can be used) for the duration of the COVID-19 declaration under Section 564(b)(1) of the Act, 21 U.S.C. section 360bbb-3(b)(1), unless the authorization is terminated or revoked.  Performed at [facility], [place] [address], [place], [place] [address]   Surgical pcr screen     Status: Abnormal   Collection Time: 04/27/22 12:19 PM   Specimen: Nasal Mucosa; Nasal Swab  Result Value Ref Range Status   MRSA, PCR POSITIVE (A) NEGATIVE Final    Comment: RESULT CALLED TO, READ BACK BY AND VERIFIED WITH: RN N.GAUNTLETT ON [id number] AT 1425 BY E.PARRISH    Staphylococcus aureus POSITIVE (A) NEGATIVE Final    Comment: (NOTE) The Xpert SA Assay (FDA approved for NASAL specimens in patients 35 years of age and older), is one component of a  comprehensive surveillance program. It is not intended to diagnose infection nor to guide or monitor treatment. Performed at [facility], [place] [address] [place], [place] [address]   Aerobic/Anaerobic Culture w Gram Stain (surgical/deep wound)     Status: None   Collection Time: 04/27/22  2:47 PM   Specimen: Other Source; Body Fluid  Result Value Ref Range Status   Specimen Description WOUND  Final   Special Requests AV GRAFT PT ON ANCEF  Final   Gram Stain NO WBC SEEN RARE GRAM POSITIVE COCCI IN PAIRS   Final   Culture   Final    RARE STAPHYLOCOCCUS AUREUS NO ANAEROBES ISOLATED Performed at [facility], [address], [place], [place] [address]    Report Status 05/02/2022 FINAL  Final   Organism ID, Bacteria STAPHYLOCOCCUS AUREUS  Final      Susceptibility   Staphylococcus aureus - MIC*    CIPROFLOXACIN <=0.5 SENSITIVE Sensitive     ERYTHROMYCIN <=0.25 SENSITIVE Sensitive     GENTAMICIN <=0.5 SENSITIVE Sensitive     OXACILLIN <=0.25 SENSITIVE Sensitive     TETRACYCLINE <=1 SENSITIVE Sensitive     VANCOMYCIN 1 SENSITIVE Sensitive     TRIMETH/SULFA <=10 SENSITIVE Sensitive     CLINDAMYCIN <=0.25 SENSITIVE Sensitive     RIFAMPIN <=0.5 SENSITIVE Sensitive     Inducible Clindamycin NEGATIVE Sensitive     * RARE STAPHYLOCOCCUS AUREUS  Culture, blood (Routine X 2) w Reflex to ID Panel     Status: None   Collection Time: 04/28/22  8:31 AM   Specimen: BLOOD LEFT FOREARM  Result Value Ref Range Status   Specimen Description BLOOD LEFT FOREARM  Final   Special Requests   Final    BOTTLES DRAWN AEROBIC AND ANAEROBIC Blood Culture adequate volume   Culture   Final    NO GROWTH 5 DAYS Performed at [facility], [address] [place], [place] [address]    Report Status 05/03/2022 FINAL  Final  Culture, blood (Routine X 2) w Reflex to ID Panel     Status: None   Collection Time: 04/28/22  8:31 AM   Specimen: BLOOD  Result Value Ref Range Status   Specimen  Description BLOOD LEFT ANTECUBITAL  Final   Special Requests IN PEDIATRIC BOTTLE Blood Culture adequate volume  Final   Culture   Final    NO GROWTH 5 DAYS Performed at [facility], [place] [address] [place], [place] [address]    Report Status 05/03/2022 FINAL  Final     Time coordinating discharge: Over 30 minutes  SIGNED:   Phillips Climes, MD  [facility] 05/04/2022, 2:18 PM Pager   If 7PM-7AM, please contact night-coverage [email or web address] Password [email or web address]

## 2022-05-04 NOTE — Progress Notes (Signed)
Pt transported off unit to dialysis. P. Amo Jarious Lyon RN 

## 2022-05-04 NOTE — Progress Notes (Signed)
Pt back to  unit from dialysis. Francis Gaines Floyd Lusignan RN   05/04/22 1222  Vitals  Temp 98.1 F (36.7 C)  Temp Source Oral  BP (!) 111/40  MAP (mmHg) (!) 62  BP Location Right Leg  BP Method Automatic  Patient Position (if appropriate) Lying  Pulse Rate 78  Pulse Rate Source Monitor  ECG Heart Rate 77  Resp 16  Level of Consciousness  Level of Consciousness Alert  MEWS COLOR  MEWS Score Color Green  Oxygen Therapy  SpO2 99 %  O2 Device Room Air  MEWS Score  MEWS Temp 0  MEWS Systolic 0  MEWS Pulse 0  MEWS RR 0  MEWS LOC 0  MEWS Score 0

## 2022-05-04 NOTE — Discharge Instructions (Addendum)
-  cefazolin can be given 2 gram post dialysis tiw on dialysis days -duration of cefazolin 4 weeks from 5/16 to 6/13 -on 6/13 she can start cephalexin 500 mg PO nightly   HD center to do Labs weekly while on IV antibiotics: _x_ CBC with differential __ BMP _x_ CMP _x_ CRP __ ESR __ Vancomycin trough __ CK     Fax weekly labs to [phone]   Clinic Follow Up Appt: 6/06 @ 945 with Dr Gale Journey   @   [facility] [place], [place], [place] [address] Phone: [phone]      Follow with Primary MD [name], MD /SNF physician  Get CBC, CMP,  checked  by Primary MD next visit.    Activity: As tolerated with Full fall precautions use walker/cane & assistance as needed   Disposition SNF   Diet: Renal  On your next visit with your primary care physician please Get Medicines reviewed and adjusted.   Please request your Prim.MD to go over all Hospital Tests and Procedure/Radiological results at the follow up, please get all Hospital records sent to your Prim MD by signing hospital release before you go home.   If you experience worsening of your admission symptoms, develop shortness of breath, life threatening emergency, suicidal or homicidal thoughts you must seek medical attention immediately by calling 911 or calling your MD immediately  if symptoms less severe.  You Must read complete instructions/literature along with all the possible adverse reactions/side effects for all the Medicines you take and that have been prescribed to you. Take any new Medicines after you have completely understood and accpet all the possible adverse reactions/side effects.   Do not drive, operating heavy machinery, perform activities at heights, swimming or participation in water activities or provide baby sitting services if your were admitted for syncope or siezures until you have seen by Primary MD or a Neurologist and advised to do so again.  Do not drive when taking Pain  medications.    Do not take more than prescribed Pain, Sleep and Anxiety Medications  Special Instructions: If you have smoked or chewed Tobacco  in the last 2 yrs please stop smoking, stop any regular Alcohol  and or any Recreational drug use.  Wear Seat belts while driving.   Please note  You were cared for by a hospitalist during your hospital stay. If you have any questions about your discharge medications or the care you received while you were in the hospital after you are discharged, you can call the unit and asked to speak with the hospitalist on call if the hospitalist that took care of you is not available. Once you are discharged, your primary care physician will handle any further medical issues. Please note that NO REFILLS for any discharge medications will be authorized once you are discharged, as it is imperative that you return to your primary care physician (or establish a relationship with a primary care physician if you do not have one) for your aftercare needs so that they can reassess your need for medications and monitor your lab values.

## 2022-05-04 NOTE — Progress Notes (Signed)
[facility] Progress Note   Subjective:   Seen on HD this am. No new c/o.   Objective Vitals:   05/04/22 0800 05/04/22 0830 05/04/22 0900 05/04/22 0930  BP: (!) 119/55 (!) 124/40 102/81 (!) 118/22  Pulse: 84     Resp: '15 16 16 16  '$ Temp:      TempSrc:      SpO2:      Weight:      Height:       Physical Exam General: Alert female in NAD Heart: RRR, no murmurs, rubs or gallops Lungs: CTA bilaterally without wheezing, rhonchi or rales Abdomen: Soft, non-distended, +BS Extremities: No edema b/l upper or lower extremities Dialysis Access:  RUE AVG is accessed   Dialysis Orders: [facility]WF [facility]  4h  350/600  45kg   2/2 bath  P2  AVG RUA  Hep none - last HD 5/12, 48.5 > 46.2kg - last Hb 10.8 on 5/08 - mircera 50 q4, last 4/21, due 5/19 - hectorol 2 ug IV tiw - sensipar 30 tiw po  Assessment/Plan: R arm draining wound - 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  MSSA sepsis/ AVG infection - +blood cx's, rare + graft/tissue for MSSA as well. No gross infection per OP note. F/u blood cx's are negative. Per ID treatment plan is 4 weeks IV Ancef from 5/16 > 6/13. On 6/13 she can be transitioned to oral cephalexin 500 mg p.o. for chronic suppression in setting of new graft material placement in infected area. See also ID note.  ESRD - on HD MWF. HD today. AVG is patent s/p revision and okay to use per VVS. We have been sticking above the wound per VVS rec's w/o issue while here. BP/ volume - low BP's required pressors in ICU, shock resolved. Holding home BP meds and on midodrine 5 tid which is new. Wt's still up but doubt accuracy. Looks euvolemic today, edema resolved.  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 Cont vdra, sensipar and binders Dispo - stable for d/c from renal standpoint, have d/w pmd     Kelly Splinter,  MD 05/04/2022, 9:47 AM   Recent Labs  Lab 04/29/22 1618 05/01/22 0415 05/03/22 2035 05/04/22 0443  HGB  --    < > 8.8* 8.9*  ALBUMIN 2.7*  --  2.5*  --   CALCIUM 8.7*   < > 9.7 9.9  PHOS 3.9  --  3.0  --   CREATININE 4.76*   < > 5.11* 5.54*  K 3.4*   < > 4.2 4.4   < > = values in this interval not displayed.   Inpatient medications:  Chlorhexidine Gluconate Cloth  6 each Topical Daily   Chlorhexidine Gluconate Cloth  6 each Topical Q0600   cinacalcet  30 mg Oral Q M,W,F-HD   clopidogrel  75 mg Oral Daily   [START ON 05/08/2022] darbepoetin (ARANESP) injection - DIALYSIS  60 mcg Intravenous Q Fri-HD   doxercalciferol  2 mcg Intravenous Q M,W,F-HD   feeding supplement  237 mL Oral BID BM   ferric citrate  420 mg Oral TID WC   heparin  5,000 Units Subcutaneous Q8H   mouth rinse  15 mL Mouth Rinse BID   midodrine  5 mg Oral TID WC   multivitamin  1  tablet Oral QHS   sodium chloride flush  10-40 mL Intracatheter Q12H    sodium chloride Stopped (05/01/22 2312)    ceFAZolin (ANCEF) IV Stopped (05/01/22 2255)   sodium chloride, acetaminophen **OR** acetaminophen, albuterol, fentaNYL (SUBLIMAZE) injection, Gerhardt's butt cream, sodium chloride flush

## 2022-05-04 NOTE — [place] FL2 (Signed)
[place] MEDICAID FL2 LEVEL OF CARE SCREENING TOOL     IDENTIFICATION  Patient Name: [name] Birthdate: 01-10-1951 Sex: female Admission Date (Current Location): 04/26/2022  [facility] and [place] Number:  Producer, television/film/video and Address:  [facility] [place], [address] [place], [place] [address]      Provider Number: [id number]  Attending Physician Name and Address:  Starleen Arms, MD  Relative Name and Phone Number:  Reuel Boom [phone]    Current Level of Care: Hospital Recommended Level of Care: Skilled Nursing Facility Prior Approval Number:    Date Approved/Denied:   PASRR Number: [id number]  Discharge Plan: SNF    Current Diagnoses: Patient Active Problem List   Diagnosis Date Noted   Sepsis [facility])    MSSA bacteremia    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HCC) 04/26/2022   Arteriovenous fistula, acquired (HCC) 02/19/2022   Cyclical vomiting syndrome 02/19/2022   Dysphagia 02/19/2022   Anemia of chronic renal failure 02/19/2022   Hypertensive heart and chronic kidney disease with heart failure and with stage 5 chronic kidney disease, or end stage renal disease (HCC) 02/19/2022   Mild cognitive impairment 02/19/2022   Major depression, single episode 02/19/2022   Chronic diastolic CHF (congestive heart failure) (HCC) 01/23/2022   Fall at home, initial encounter 01/23/2022   Acute on chronic diastolic (congestive) heart failure (HCC) 11/08/2021   GERD (gastroesophageal reflux disease)    Other disorders of calcium metabolism 10/17/2021   Protein-calorie malnutrition, severe 08/12/2021   Acute respiratory failure with hypoxia (HCC) 08/06/2021   Major depressive disorder, recurrent episode, moderate (HCC) 02/23/2021   Prolonged QT interval 02/21/2021   Allergy, unspecified, initial encounter 08/08/2020   Ventricular tachycardia (HCC) 03/15/2020   Seizure (HCC) 03/15/2020   Complication of vascular access for dialysis 12/21/2019    Breakdown (mechanical) of surgically created arteriovenous fistula, initial encounter (HCC) 12/18/2019   Weakness 10/11/2019   Aneurysm artery, 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   Dependence on renal dialysis (HCC) 07/24/2019   Iron deficiency anemia, unspecified 06/09/2019   Age-related osteoporosis without current pathological fracture 04/17/2019   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   Coagulation defect, unspecified (HCC) 04/17/2019   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   Primary generalized (osteo)arthritis 04/17/2019   Pure hypercholesterolemia, unspecified 04/17/2019   Secondary hyperparathyroidism of renal origin (HCC) 04/17/2019   Gastro-esophageal reflux disease without esophagitis 04/17/2019   Essential (primary) hypertension 04/17/2019   Hyperlipidemia, unspecified 04/17/2019   Transient cerebral ischemic attack, unspecified 04/17/2019   Prolonged Q-T interval on ECG 02/24/2017   Malnutrition of moderate degree 12/24/2016   Problem with dialysis access (HCC) 12/21/2016   Neurologic abnormality 11/19/2015   Anxiety 11/19/2015   Insomnia 11/19/2015   Gait instability    Dizziness 05/09/2015   Ataxia 05/09/2015   H/O: CVA (cerebrovascular accident) 05/09/2015   Left facial numbness 05/09/2015   Left leg numbness 05/09/2015   Hyperlipidemia    SOB (shortness of breath) 04/01/2013   HTN (hypertension) 04/01/2013   End-stage renal disease on hemodialysis (HCC) 11/07/2012   Dyspnea 12/31/2011    Orientation RESPIRATION BLADDER Height & Weight     Self, Time, Situation, Place  O2 (Nasal Cannula 2 liters)   Weight: 106 lb 4.2 oz (48.2 kg) Height:  5\' 2"  (157.5 cm)  BEHAVIORAL SYMPTOMS/MOOD NEUROLOGICAL BOWEL NUTRITION STATUS      Incontinent Diet (Please see discharge summary)  AMBULATORY STATUS COMMUNICATION OF NEEDS Skin   Extensive Assist Verbally Other (Comment) (Appropriate for  ethnicity,dry,scratch marks,foot,bilateral,Incision  closed,arm,Right,clean,dry,intact,sutures)                       Personal Care Assistance Level of Assistance  Bathing, Feeding, Dressing Bathing Assistance: Maximum assistance Feeding assistance: Independent (able to feed self,mechanical soft) Dressing Assistance: Maximum assistance     Functional Limitations Info  Sight, Hearing, Speech Sight Info: Adequate (WDL) Hearing Info: Adequate Speech Info: Adequate    SPECIAL CARE FACTORS FREQUENCY  PT (By licensed PT), OT (By licensed OT)     PT Frequency: 5x min weekly OT Frequency: 5x min weekly            Contractures Contractures Info: Not present    Additional Factors Info  Code Status, Allergies Code Status Info: FULL Allergies Info: Sulfa Antibiotics,Adhesive (tape)           Current Medications (05/04/2022):  This is the current hospital active medication list Current Facility-Administered Medications  Medication Dose Route Frequency Provider Last Rate Last Admin   0.9 %  sodium chloride infusion   Intravenous PRN Charlott Holler, MD   Stopped at 05/01/22 2312   acetaminophen (TYLENOL) tablet 650 mg  650 mg Oral Q6H PRN Chuck Hint, MD       Or   acetaminophen (TYLENOL) suppository 650 mg  650 mg Rectal Q6H PRN Chuck Hint, MD       albuterol (PROVENTIL) (2.5 MG/3ML) 0.083% nebulizer solution 2.5 mg  2.5 mg Nebulization Q2H PRN Chuck Hint, MD       ceFAZolin (ANCEF) IVPB 2g/100 mL premix  2 g Intravenous Q M,W,F-2000 Vu, Trung T, MD   Stopped at 05/01/22 2255   Chlorhexidine Gluconate Cloth 2 % PADS 6 each  6 each Topical Daily Elgergawy, Leana Roe, MD   6 each at 05/04/22 1242   Chlorhexidine Gluconate Cloth 2 % PADS 6 each  6 each Topical Q0600 Oretha Milch, PA-C   6 each at 05/04/22 3086   cinacalcet (SENSIPAR) tablet 30 mg  30 mg Oral Q M,W,F-HD Delano Metz, MD       clopidogrel (PLAVIX) tablet 75 mg  75 mg Oral Daily Chuck Hint, MD   75 mg at  05/04/22 1242   [START ON 05/08/2022] Darbepoetin Alfa (ARANESP) injection 60 mcg  60 mcg Intravenous Q Fri-HD Delano Metz, MD       doxercalciferol (HECTOROL) injection 2 mcg  2 mcg Intravenous Q M,W,F-HD Delano Metz, MD   2 mcg at 05/04/22 1023   feeding supplement (ENSURE ENLIVE / ENSURE PLUS) liquid 237 mL  237 mL Oral BID BM Charlott Holler, MD   237 mL at 05/04/22 1243   fentaNYL (SUBLIMAZE) injection 25 mcg  25 mcg Intravenous Q2H PRN Larinda Buttery, MD   25 mcg at 04/27/22 2344   ferric citrate (AURYXIA) tablet 420 mg  420 mg Oral TID WC Delano Metz, MD   420 mg at 05/04/22 1242   Gerhardt's butt cream   Topical PRN Elgergawy, Leana Roe, MD       heparin injection 5,000 Units  5,000 Units Subcutaneous Q8H Chuck Hint, MD   5,000 Units at 05/04/22 0540   MEDLINE mouth rinse  15 mL Mouth Rinse BID Charlott Holler, MD   15 mL at 05/04/22 1243   midodrine (PROAMATINE) tablet 5 mg  5 mg Oral TID WC Duayne Cal, NP   5 mg at 05/04/22 1242   multivitamin (RENA-VIT) tablet 1 tablet  1 tablet Oral QHS Charlott Holler, MD   1 tablet at 05/03/22 2222   sodium chloride flush (NS) 0.9 % injection 10-40 mL  10-40 mL Intracatheter Q12H Larinda Buttery, MD   30 mL at 05/04/22 1243   sodium chloride flush (NS) 0.9 % injection 10-40 mL  10-40 mL Intracatheter PRN Larinda Buttery, MD         Discharge Medications: Please see discharge summary for a list of discharge medications.  Relevant Imaging Results:  Relevant Lab Results:   Additional Information [id number] at [facility] on MWF. Pt arrives at 11:40 am for 11:55 am chair time.  Avier Jech Wynn Banker, LCSWA

## 2022-05-04 NOTE — Progress Notes (Signed)
PROGRESS NOTE    [name]  JJK:[id number] DOB: [birth year] DOA: 04/26/2022 PCP: [name], MD   Chief Complaint  Patient presents with   Shortness of Breath   Weakness    Brief Narrative:  [name] is a 71 y.o. F with PMH significant for ESRD on HD, CHF, CAD, HL, HTN who presented to the ED on 5/14 with purulent drainage from the AV fistula on the RUE.  She reported a "bug bite" on the site.   She was admitted to the hospitalist service and started on Cefepime and Vancomycin and taken to the OR for I&D on 5/15.  Peri-operatively pt's blood pressure dropped and she was started on Levophed and admitted to ICU.  5/14 admitted with RUE AV graft infection, started on Vanc/Cefepime 5/15 Taken to OR for graft revision, hypotensive and started on Levophed, PCCM consulted, intubated 5/16 Extubated, on low dose levophed 5/18 transferred to [facility] service.    Assessment & Plan:   Principal Problem:   Acute respiratory failure with hypoxia (HCC) Active Problems:   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HCC)   End-stage renal disease on hemodialysis (HCC)   HTN (hypertension)   Chronic diastolic CHF (congestive heart failure) (HCC)   Anemia of chronic renal failure   Sepsis ([place])   MSSA bacteremia   Septic shock s in the setting of AV graft infection, staph bacteremia  -Present on admission -Requiring pressor support initially, currently weaned off pressor support.  -Blood pressure is stable on midodrine.  AV graft infection, MSSA bacteremia -Status post AVG revision by VVS on 5/15 (small segment resected due to degeneration and new segment AVG replaced). -Per VVS okay to stick above the wound.   - TTE with no clear vegetation. Although there is TR and MR.  -Antibiotic management per ID, continue with IV Ancef.  Duration for treatment is 4 weeks from 5/16 to 6/13 and on 6/13 she can be transitioned to oral cephalexin 500 mg p.o. nightly  Acute respiratory failure with hypoxia -  extubated successfully to [place] - DC supplemental oxygen.  -Encouraged use incentive spirometry and flutter valve   End-stage renal disease -Management per nephrology ,she was dialyzed yesterday to go on Monday Wednesday Friday, her outpatient dialysis schedule. - OK to use new fistula on left forearm per VVS   Anemia of chronic renal disease - Monitor CBC, transfuse as needed.   Hypertension, diastolic CHF -Volume management with dialysis  Difficult IV access -Unable to establish peripheral IV, so we will continue with left IJ for now  Deconditioning/weakness -PT/OT consulted, recommendation for SNF placement    DVT prophylaxis: Heparin Code Status: Full Family Communication: None at bedside Disposition: will need SNF placment  Status is: Inpatient Patient is medically stable for discharge once SNF bed is available.    Consultants:  PCCM VVS Renal    Subjective:  No significant events overnight, she denies any complaints.  Objective: Vitals:   05/04/22 1030 05/04/22 1100 05/04/22 1110 05/04/22 1130  BP: (!) 107/42 (!) 112/31 (!) 112/43 (!) 113/33  Pulse:   77 76  Resp: '17 14 14 14  '$ Temp:    97.6 F (36.4 C)  TempSrc:    Oral  SpO2:    100%  Weight:    48.2 kg  Height:        Intake/Output Summary (Last 24 hours) at 05/04/2022 1158 Last data filed at 05/04/2022 1130 Gross per 24 hour  Intake --  Output 2000 ml  Net -2000 ml  Filed Weights   05/04/22 0509 05/04/22 0740 05/04/22 1130  Weight: 51.8 kg 51.2 kg 48.2 kg    Examination:  Awake Alert, pleasant, no apparent distress. Symmetrical Chest wall movement, Good air movement bilaterally, CTAB RRR,No Gallops,Rubs or new Murmurs, No Parasternal Heave +ve B.Sounds, Abd Soft, No tenderness, No rebound - guarding or rigidity. No Cyanosis, Clubbing or edema, No new Rash or bruise          Data Reviewed: I have personally reviewed following labs and imaging studies  CBC: Recent Labs  Lab  04/29/22 0449 05/01/22 0415 05/02/22 0408 05/03/22 2035 05/04/22 0443  WBC 7.3 6.4 7.3 8.5 8.5  HGB 8.7* 8.6* 8.3* 8.8* 8.9*  HCT 25.9* 25.2* 25.8* 25.9* 26.3*  MCV 96.6 96.2 97.0 94.9 95.3  PLT 147* 154 181 218 454    Basic Metabolic Panel: Recent Labs  Lab 04/27/22 1958 04/28/22 0410 04/29/22 1618 05/01/22 0415 05/02/22 0408 05/03/22 2035 05/04/22 0443  NA 135   < > 137 135 132* 129* 131*  K 4.8   < > 3.4* 3.4* 3.5 4.2 4.4  CL 96*   < > 99 97* 94* 93* 95*  CO2 21*   < > '26 28 27 24 24  '$ GLUCOSE 204*   < > 110* 99 91 117* 92  BUN 46*   < > 30* 18 12 33* 37*  CREATININE 6.40*   < > 4.76* 3.31* 2.76* 5.11* 5.54*  CALCIUM 8.3*   < > 8.7* 9.3 9.3 9.7 9.9  MG 2.2  --   --   --   --   --   --   PHOS  --   --  3.9  --   --  3.0  --    < > = values in this interval not displayed.    GFR: Estimated Creatinine Clearance: 7.1 mL/min (A) (by C-G formula based on SCr of 5.54 mg/dL (H)).  Liver Function Tests: Recent Labs  Lab 04/27/22 1958 04/29/22 1618 05/03/22 2035  AST 144*  --   --   ALT 63*  --   --   ALKPHOS 189*  --   --   BILITOT 1.2  --   --   PROT 6.4*  --   --   ALBUMIN 3.1* 2.7* 2.5*    CBG: Recent Labs  Lab 04/27/22 1557 04/27/22 1619 04/27/22 1942 04/28/22 0156 04/28/22 2003  GLUCAP 65* 120* 179* 189* 114*     Recent Results (from the past 240 hour(s))  Blood Culture (routine x 2)     Status: Abnormal   Collection Time: 04/26/22  8:40 PM   Specimen: BLOOD  Result Value Ref Range Status   Specimen Description BLOOD  Final   Special Requests   Final    RIGHT EJ BOTTLES DRAWN AEROBIC AND ANAEROBIC Blood Culture adequate volume   Culture  Setup Time   Final    GRAM POSITIVE COCCI IN BOTH AEROBIC AND ANAEROBIC BOTTLES CRITICAL RESULT CALLED TO, READ BACK BY AND VERIFIED WITH: PHARMD ALEX 134 [id number] FCP Performed at [facility], [place] [address] [place], [place] [address]    Culture STAPHYLOCOCCUS AUREUS (A)  Final   Report Status  04/29/2022 FINAL  Final   Organism ID, Bacteria STAPHYLOCOCCUS AUREUS  Final      Susceptibility   Staphylococcus aureus - MIC*    CIPROFLOXACIN <=0.5 SENSITIVE Sensitive     ERYTHROMYCIN <=0.25 SENSITIVE Sensitive     GENTAMICIN <=0.5 SENSITIVE Sensitive  OXACILLIN 0.5 SENSITIVE Sensitive     TETRACYCLINE <=1 SENSITIVE Sensitive     VANCOMYCIN 1 SENSITIVE Sensitive     TRIMETH/SULFA <=10 SENSITIVE Sensitive     CLINDAMYCIN <=0.25 SENSITIVE Sensitive     RIFAMPIN <=0.5 SENSITIVE Sensitive     Inducible Clindamycin NEGATIVE Sensitive     * STAPHYLOCOCCUS AUREUS  Blood Culture ID Panel (Reflexed)     Status: Abnormal   Collection Time: 04/26/22  8:40 PM  Result Value Ref Range Status   Enterococcus faecalis NOT DETECTED NOT DETECTED Final   Enterococcus Faecium NOT DETECTED NOT DETECTED Final   Listeria monocytogenes NOT DETECTED NOT DETECTED Final   Staphylococcus species DETECTED (A) NOT DETECTED Final    Comment: CRITICAL RESULT CALLED TO, READ BACK BY AND VERIFIED WITH: PHARMD ALEX L [id number] FCP    Staphylococcus aureus (BCID) DETECTED (A) NOT DETECTED Final    Comment: CRITICAL RESULT CALLED TO, READ BACK BY AND VERIFIED WITH: PHARMD ALEX L [id number] FCP    Staphylococcus epidermidis NOT DETECTED NOT DETECTED Final   Staphylococcus lugdunensis NOT DETECTED NOT DETECTED Final   Streptococcus species NOT DETECTED NOT DETECTED Final   Streptococcus agalactiae NOT DETECTED NOT DETECTED Final   Streptococcus pneumoniae NOT DETECTED NOT DETECTED Final   Streptococcus pyogenes NOT DETECTED NOT DETECTED Final   A.calcoaceticus-baumannii NOT DETECTED NOT DETECTED Final   Bacteroides fragilis NOT DETECTED NOT DETECTED Final   Enterobacterales NOT DETECTED NOT DETECTED Final   Enterobacter cloacae complex NOT DETECTED NOT DETECTED Final   Escherichia coli NOT DETECTED NOT DETECTED Final   Klebsiella aerogenes NOT DETECTED NOT DETECTED Final   Klebsiella oxytoca NOT DETECTED  NOT DETECTED Final   Klebsiella pneumoniae NOT DETECTED NOT DETECTED Final   Proteus species NOT DETECTED NOT DETECTED Final   Salmonella species NOT DETECTED NOT DETECTED Final   Serratia marcescens NOT DETECTED NOT DETECTED Final   Haemophilus influenzae NOT DETECTED NOT DETECTED Final   Neisseria meningitidis NOT DETECTED NOT DETECTED Final   Pseudomonas aeruginosa NOT DETECTED NOT DETECTED Final   Stenotrophomonas maltophilia NOT DETECTED NOT DETECTED Final   Candida albicans NOT DETECTED NOT DETECTED Final   Candida auris NOT DETECTED NOT DETECTED Final   Candida glabrata NOT DETECTED NOT DETECTED Final   Candida krusei NOT DETECTED NOT DETECTED Final   Candida parapsilosis NOT DETECTED NOT DETECTED Final   Candida tropicalis NOT DETECTED NOT DETECTED Final   Cryptococcus neoformans/gattii NOT DETECTED NOT DETECTED Final   Meth resistant mecA/C and MREJ NOT DETECTED NOT DETECTED Final    Comment: Performed at [facility], [address] [place], [place] [address]  Resp Panel by RT-PCR (Flu A&B, Covid) Nasopharyngeal Swab     Status: None   Collection Time: 04/26/22  9:35 PM   Specimen: Nasopharyngeal Swab; Nasopharyngeal(NP) swabs in vial transport medium  Result Value Ref Range Status   SARS Coronavirus 2 by RT PCR NEGATIVE NEGATIVE Final    Comment: (NOTE) SARS-CoV-2 target nucleic acids are NOT DETECTED.  The SARS-CoV-2 RNA is generally detectable in upper respiratory specimens during the acute phase of infection. The lowest concentration of SARS-CoV-2 viral copies this assay can detect is 138 copies/mL. A negative result does not preclude SARS-Cov-2 infection and should not be used as the sole basis for treatment or other patient management decisions. A negative result may occur with  improper specimen collection/handling, submission of specimen other than nasopharyngeal swab, presence of viral mutation(s) within the areas targeted by this  assay, and inadequate  number of viral copies(<138 copies/mL). A negative result must be combined with clinical observations, patient history, and epidemiological information. The expected result is Negative.  Fact Sheet for Patients:  EntrepreneurPulse.com.au  Fact Sheet for Healthcare Providers:  IncredibleEmployment.be  This test is no t yet approved or cleared by the [place] FDA and  has been authorized for detection and/or diagnosis of SARS-CoV-2 by FDA under an Emergency Use Authorization (EUA). This EUA will remain  in effect (meaning this test can be used) for the duration of the COVID-19 declaration under Section 564(b)(1) of the Act, 21 U.S.C.section 360bbb-3(b)(1), unless the authorization is terminated  or revoked sooner.       Influenza A by PCR NEGATIVE NEGATIVE Final   Influenza B by PCR NEGATIVE NEGATIVE Final    Comment: (NOTE) The Xpert Xpress SARS-CoV-2/FLU/RSV plus assay is intended as an aid in the diagnosis of influenza from Nasopharyngeal swab specimens and should not be used as a sole basis for treatment. Nasal washings and aspirates are unacceptable for Xpert Xpress SARS-CoV-2/FLU/RSV testing.  Fact Sheet for Patients: EntrepreneurPulse.com.au  Fact Sheet for Healthcare Providers: IncredibleEmployment.be  This test is not yet approved or cleared by the [place] FDA and has been authorized for detection and/or diagnosis of SARS-CoV-2 by FDA under an Emergency Use Authorization (EUA). This EUA will remain in effect (meaning this test can be used) for the duration of the COVID-19 declaration under Section 564(b)(1) of the Act, 21 U.S.C. section 360bbb-3(b)(1), unless the authorization is terminated or revoked.  Performed at [facility], [place] [address], [place], [place] [address]   Surgical pcr screen     Status: Abnormal   Collection Time: 04/27/22 12:19 PM   Specimen: Nasal Mucosa;  Nasal Swab  Result Value Ref Range Status   MRSA, PCR POSITIVE (A) NEGATIVE Final    Comment: RESULT CALLED TO, READ BACK BY AND VERIFIED WITH: RN N.GAUNTLETT ON [id number] AT 1425 BY E.PARRISH    Staphylococcus aureus POSITIVE (A) NEGATIVE Final    Comment: (NOTE) The Xpert SA Assay (FDA approved for NASAL specimens in patients 37 years of age and older), is one component of a comprehensive surveillance program. It is not intended to diagnose infection nor to guide or monitor treatment. Performed at [facility], [place] [address] [place], [place] [address]   Aerobic/Anaerobic Culture w Gram Stain (surgical/deep wound)     Status: None   Collection Time: 04/27/22  2:47 PM   Specimen: Other Source; Body Fluid  Result Value Ref Range Status   Specimen Description WOUND  Final   Special Requests AV GRAFT PT ON ANCEF  Final   Gram Stain NO WBC SEEN RARE GRAM POSITIVE COCCI IN PAIRS   Final   Culture   Final    RARE STAPHYLOCOCCUS AUREUS NO ANAEROBES ISOLATED Performed at [facility], [address], [place], [place] [address]    Report Status 05/02/2022 FINAL  Final   Organism ID, Bacteria STAPHYLOCOCCUS AUREUS  Final      Susceptibility   Staphylococcus aureus - MIC*    CIPROFLOXACIN <=0.5 SENSITIVE Sensitive     ERYTHROMYCIN <=0.25 SENSITIVE Sensitive     GENTAMICIN <=0.5 SENSITIVE Sensitive     OXACILLIN <=0.25 SENSITIVE Sensitive     TETRACYCLINE <=1 SENSITIVE Sensitive     VANCOMYCIN 1 SENSITIVE Sensitive     TRIMETH/SULFA <=10 SENSITIVE Sensitive     CLINDAMYCIN <=0.25 SENSITIVE Sensitive  RIFAMPIN <=0.5 SENSITIVE Sensitive     Inducible Clindamycin NEGATIVE Sensitive     * RARE STAPHYLOCOCCUS AUREUS  Culture, blood (Routine X 2) w Reflex to ID Panel     Status: None   Collection Time: 04/28/22  8:31 AM   Specimen: BLOOD LEFT FOREARM  Result Value Ref Range Status   Specimen Description BLOOD LEFT FOREARM  Final   Special Requests   Final    BOTTLES  DRAWN AEROBIC AND ANAEROBIC Blood Culture adequate volume   Culture   Final    NO GROWTH 5 DAYS Performed at [facility], [place] [address] [place], [place] [address]    Report Status 05/03/2022 FINAL  Final  Culture, blood (Routine X 2) w Reflex to ID Panel     Status: None   Collection Time: 04/28/22  8:31 AM   Specimen: BLOOD  Result Value Ref Range Status   Specimen Description BLOOD LEFT ANTECUBITAL  Final   Special Requests IN PEDIATRIC BOTTLE Blood Culture adequate volume  Final   Culture   Final    NO GROWTH 5 DAYS Performed at [facility], [place] [address] [place],  [address]    Report Status 05/03/2022 FINAL  Final         Radiology Studies: No results found.      Scheduled Meds:  Chlorhexidine Gluconate Cloth  6 each Topical Daily   Chlorhexidine Gluconate Cloth  6 each Topical Q0600   cinacalcet  30 mg Oral Q M,W,F-HD   clopidogrel  75 mg Oral Daily   [START ON 05/08/2022] darbepoetin (ARANESP) injection - DIALYSIS  60 mcg Intravenous Q Fri-HD   doxercalciferol  2 mcg Intravenous Q M,W,F-HD   feeding supplement  237 mL Oral BID BM   ferric citrate  420 mg Oral TID WC   heparin  5,000 Units Subcutaneous Q8H   mouth rinse  15 mL Mouth Rinse BID   midodrine  5 mg Oral TID WC   multivitamin  1 tablet Oral QHS   sodium chloride flush  10-40 mL Intracatheter Q12H   Continuous Infusions:  sodium chloride Stopped (05/01/22 2312)    ceFAZolin (ANCEF) IV Stopped (05/01/22 2255)     LOS: 8 days       Phillips Climes, MD [facility]   To contact the attending provider between 7A-7P or the covering provider during after hours 7P-7A, please log into the web site [email or web address] and access using universal [place] password for that web site. If you do not have the password, please call the hospital operator.  05/04/2022, 11:58 AM

## 2022-05-04 NOTE — TOC Transition Note (Addendum)
Transition of Care [facility]) - CM/SW Discharge Note   Patient Details  Name: [name] MRN: [id number] Date of Birth: February 19, 1951  Transition of Care [facility]) CM/SW Contact:  Tresa Endo Phone Number: 05/04/2022, 1:27 PM   Clinical Narrative:    Patient will DC to: [name] Anticipated DC date: 05/04/2022 Family notified: Pt son Transport by: Corey Harold   Per MD patient ready for DC to [facility] room 109. RN to call report prior to discharge ([phone]). RN, patient, patient's family, and facility notified of DC. Discharge Summary and FL2 sent to facility. DC packet on chart. Ambulance transport requested for patient.   CSW will sign off for now as social work intervention is no longer needed. Please consult [place] again if new needs arise.       Barriers to Discharge: Continued Medical Work up   Patient Goals and CMS Choice Patient states their goals for this hospitalization and ongoing recovery are:: SNF CMS Medicare.gov Compare Post Acute Care list provided to:: Patient Choice offered to / list presented to : Patient  Discharge Placement                       Discharge Plan and Services In-house Referral: Clinical Social Work                                   Social Determinants of Health ([place]) Interventions     Readmission Risk Interventions    11/11/2021    3:49 PM 08/18/2021    3:01 PM  Readmission Risk Prevention Plan  Transportation Screening Complete Complete  Medication Review [employer] photographer) Complete Complete  PCP or Specialist appointment within 3-5 days of discharge Complete Complete  HRI or Home Care Consult Complete Complete  SW Recovery Care/Counseling Consult Complete Complete  Palliative Care Screening Not Applicable Not Applicable  Skilled Nursing Facility Complete Complete

## 2022-05-04 NOTE — Progress Notes (Signed)
Pt discharge education and instructions completed. Pt discharged to [facility] and [facility] picked up to transport pt to disposition. Report called off to nurse LeToya at the facility and pt transported off unit via stretcher with belongings to the side. Delia Heady RN

## 2022-05-04 NOTE — Progress Notes (Signed)
D/C order noted. Contacted [facility] to advise clinic of pt's d/c today and that pt will resume care on Wednesday. Clinic also advised that pt to admit to [facility] at d/c. Clinic advised that nephrology staff should provide orders to clinic for iv abx with HD at d/c.   Melven Sartorius Renal Navigator [phone]

## 2022-05-06 ENCOUNTER — Emergency Department (HOSPITAL_COMMUNITY): Payer: Medicare Other

## 2022-05-06 ENCOUNTER — Inpatient Hospital Stay (HOSPITAL_COMMUNITY)
Admission: EM | Admit: 2022-05-06 | Discharge: 2022-05-22 | DRG: 280 | Disposition: A | Discharge status: Skilled Nursing Facility | Payer: Medicare Other | Source: Skilled Nursing Facility | Attending: Student | Admitting: Student

## 2022-05-06 ENCOUNTER — Other Ambulatory Visit: Payer: Self-pay

## 2022-05-06 ENCOUNTER — Encounter (HOSPITAL_COMMUNITY): Payer: Self-pay

## 2022-05-06 DIAGNOSIS — I272 Pulmonary hypertension, unspecified: Secondary | ICD-10-CM | POA: Diagnosis present

## 2022-05-06 DIAGNOSIS — Z83438 Family history of other disorder of lipoprotein metabolism and other lipidemia: Secondary | ICD-10-CM

## 2022-05-06 DIAGNOSIS — I959 Hypotension, unspecified: Secondary | ICD-10-CM | POA: Diagnosis not present

## 2022-05-06 DIAGNOSIS — N186 End stage renal disease: Secondary | ICD-10-CM | POA: Diagnosis not present

## 2022-05-06 DIAGNOSIS — E875 Hyperkalemia: Secondary | ICD-10-CM | POA: Diagnosis not present

## 2022-05-06 DIAGNOSIS — Y712 Prosthetic and other implants, materials and accessory cardiovascular devices associated with adverse incidents: Secondary | ICD-10-CM | POA: Diagnosis present

## 2022-05-06 DIAGNOSIS — Z87891 Personal history of nicotine dependence: Secondary | ICD-10-CM

## 2022-05-06 DIAGNOSIS — D631 Anemia in chronic kidney disease: Secondary | ICD-10-CM | POA: Diagnosis present

## 2022-05-06 DIAGNOSIS — T82838A Hemorrhage of vascular prosthetic devices, implants and grafts, initial encounter: Secondary | ICD-10-CM | POA: Diagnosis not present

## 2022-05-06 DIAGNOSIS — I252 Old myocardial infarction: Secondary | ICD-10-CM

## 2022-05-06 DIAGNOSIS — L24A2 Irritant contact dermatitis due to fecal, urinary or dual incontinence: Secondary | ICD-10-CM | POA: Diagnosis not present

## 2022-05-06 DIAGNOSIS — Z66 Do not resuscitate: Secondary | ICD-10-CM | POA: Diagnosis present

## 2022-05-06 DIAGNOSIS — Z992 Dependence on renal dialysis: Secondary | ICD-10-CM

## 2022-05-06 DIAGNOSIS — A0472 Enterocolitis due to Clostridium difficile, not specified as recurrent: Secondary | ICD-10-CM | POA: Diagnosis present

## 2022-05-06 DIAGNOSIS — A419 Sepsis, unspecified organism: Principal | ICD-10-CM

## 2022-05-06 DIAGNOSIS — E872 Acidosis, unspecified: Secondary | ICD-10-CM | POA: Diagnosis present

## 2022-05-06 DIAGNOSIS — J9601 Acute respiratory failure with hypoxia: Secondary | ICD-10-CM | POA: Diagnosis not present

## 2022-05-06 DIAGNOSIS — D72829 Elevated white blood cell count, unspecified: Secondary | ICD-10-CM | POA: Diagnosis not present

## 2022-05-06 DIAGNOSIS — J811 Chronic pulmonary edema: Secondary | ICD-10-CM | POA: Diagnosis not present

## 2022-05-06 DIAGNOSIS — I5033 Acute on chronic diastolic (congestive) heart failure: Secondary | ICD-10-CM | POA: Diagnosis present

## 2022-05-06 DIAGNOSIS — S0990XA Unspecified injury of head, initial encounter: Secondary | ICD-10-CM | POA: Diagnosis not present

## 2022-05-06 DIAGNOSIS — I5032 Chronic diastolic (congestive) heart failure: Secondary | ICD-10-CM

## 2022-05-06 DIAGNOSIS — Z8673 Personal history of transient ischemic attack (TIA), and cerebral infarction without residual deficits: Secondary | ICD-10-CM

## 2022-05-06 DIAGNOSIS — M1990 Unspecified osteoarthritis, unspecified site: Secondary | ICD-10-CM | POA: Diagnosis present

## 2022-05-06 DIAGNOSIS — E440 Moderate protein-calorie malnutrition: Secondary | ICD-10-CM | POA: Diagnosis present

## 2022-05-06 DIAGNOSIS — I132 Hypertensive heart and chronic kidney disease with heart failure and with stage 5 chronic kidney disease, or end stage renal disease: Secondary | ICD-10-CM | POA: Diagnosis present

## 2022-05-06 DIAGNOSIS — T8130XA Disruption of wound, unspecified, initial encounter: Secondary | ICD-10-CM | POA: Diagnosis not present

## 2022-05-06 DIAGNOSIS — R279 Unspecified lack of coordination: Secondary | ICD-10-CM | POA: Diagnosis not present

## 2022-05-06 DIAGNOSIS — Z940 Kidney transplant status: Secondary | ICD-10-CM | POA: Diagnosis not present

## 2022-05-06 DIAGNOSIS — I214 Non-ST elevation (NSTEMI) myocardial infarction: Principal | ICD-10-CM | POA: Diagnosis present

## 2022-05-06 DIAGNOSIS — M810 Age-related osteoporosis without current pathological fracture: Secondary | ICD-10-CM | POA: Diagnosis present

## 2022-05-06 DIAGNOSIS — R41841 Cognitive communication deficit: Secondary | ICD-10-CM | POA: Diagnosis not present

## 2022-05-06 DIAGNOSIS — M6281 Muscle weakness (generalized): Secondary | ICD-10-CM | POA: Diagnosis not present

## 2022-05-06 DIAGNOSIS — R0602 Shortness of breath: Secondary | ICD-10-CM | POA: Diagnosis not present

## 2022-05-06 DIAGNOSIS — R5381 Other malaise: Secondary | ICD-10-CM

## 2022-05-06 DIAGNOSIS — G4700 Insomnia, unspecified: Secondary | ICD-10-CM | POA: Diagnosis not present

## 2022-05-06 DIAGNOSIS — I509 Heart failure, unspecified: Secondary | ICD-10-CM | POA: Diagnosis not present

## 2022-05-06 DIAGNOSIS — Z981 Arthrodesis status: Secondary | ICD-10-CM

## 2022-05-06 DIAGNOSIS — L309 Dermatitis, unspecified: Secondary | ICD-10-CM

## 2022-05-06 DIAGNOSIS — E8770 Fluid overload, unspecified: Secondary | ICD-10-CM | POA: Diagnosis not present

## 2022-05-06 DIAGNOSIS — I70202 Unspecified atherosclerosis of native arteries of extremities, left leg: Secondary | ICD-10-CM | POA: Diagnosis present

## 2022-05-06 DIAGNOSIS — N185 Chronic kidney disease, stage 5: Secondary | ICD-10-CM | POA: Diagnosis not present

## 2022-05-06 DIAGNOSIS — T8131XA Disruption of external operation (surgical) wound, not elsewhere classified, initial encounter: Secondary | ICD-10-CM | POA: Diagnosis not present

## 2022-05-06 DIAGNOSIS — G319 Degenerative disease of nervous system, unspecified: Secondary | ICD-10-CM | POA: Diagnosis not present

## 2022-05-06 DIAGNOSIS — I10 Essential (primary) hypertension: Secondary | ICD-10-CM | POA: Diagnosis not present

## 2022-05-06 DIAGNOSIS — I5022 Chronic systolic (congestive) heart failure: Secondary | ICD-10-CM | POA: Diagnosis not present

## 2022-05-06 DIAGNOSIS — E785 Hyperlipidemia, unspecified: Secondary | ICD-10-CM | POA: Diagnosis not present

## 2022-05-06 DIAGNOSIS — Z823 Family history of stroke: Secondary | ICD-10-CM

## 2022-05-06 DIAGNOSIS — T8579XA Infection and inflammatory reaction due to other internal prosthetic devices, implants and grafts, initial encounter: Secondary | ICD-10-CM | POA: Diagnosis present

## 2022-05-06 DIAGNOSIS — Z7902 Long term (current) use of antithrombotics/antiplatelets: Secondary | ICD-10-CM

## 2022-05-06 DIAGNOSIS — T82898A Other specified complication of vascular prosthetic devices, implants and grafts, initial encounter: Secondary | ICD-10-CM | POA: Diagnosis not present

## 2022-05-06 DIAGNOSIS — Z1152 Encounter for screening for COVID-19: Secondary | ICD-10-CM | POA: Diagnosis not present

## 2022-05-06 DIAGNOSIS — F03918 Unspecified dementia, unspecified severity, with other behavioral disturbance: Secondary | ICD-10-CM | POA: Diagnosis present

## 2022-05-06 DIAGNOSIS — F32A Depression, unspecified: Secondary | ICD-10-CM | POA: Diagnosis present

## 2022-05-06 DIAGNOSIS — K219 Gastro-esophageal reflux disease without esophagitis: Secondary | ICD-10-CM | POA: Diagnosis present

## 2022-05-06 DIAGNOSIS — R64 Cachexia: Secondary | ICD-10-CM | POA: Diagnosis not present

## 2022-05-06 DIAGNOSIS — Z681 Body mass index (BMI) 19 or less, adult: Secondary | ICD-10-CM

## 2022-05-06 DIAGNOSIS — B9561 Methicillin susceptible Staphylococcus aureus infection as the cause of diseases classified elsewhere: Secondary | ICD-10-CM | POA: Diagnosis not present

## 2022-05-06 DIAGNOSIS — R197 Diarrhea, unspecified: Secondary | ICD-10-CM | POA: Diagnosis present

## 2022-05-06 DIAGNOSIS — L7632 Postprocedural hematoma of skin and subcutaneous tissue following other procedure: Secondary | ICD-10-CM | POA: Diagnosis not present

## 2022-05-06 DIAGNOSIS — T827XXA Infection and inflammatory reaction due to other cardiac and vascular devices, implants and grafts, initial encounter: Secondary | ICD-10-CM | POA: Diagnosis present

## 2022-05-06 DIAGNOSIS — I25110 Atherosclerotic heart disease of native coronary artery with unstable angina pectoris: Secondary | ICD-10-CM | POA: Diagnosis present

## 2022-05-06 DIAGNOSIS — Z8249 Family history of ischemic heart disease and other diseases of the circulatory system: Secondary | ICD-10-CM

## 2022-05-06 DIAGNOSIS — Z9981 Dependence on supplemental oxygen: Secondary | ICD-10-CM

## 2022-05-06 DIAGNOSIS — T827XXD Infection and inflammatory reaction due to other cardiac and vascular devices, implants and grafts, subsequent encounter: Secondary | ICD-10-CM | POA: Diagnosis not present

## 2022-05-06 DIAGNOSIS — R079 Chest pain, unspecified: Secondary | ICD-10-CM | POA: Diagnosis not present

## 2022-05-06 DIAGNOSIS — I120 Hypertensive chronic kidney disease with stage 5 chronic kidney disease or end stage renal disease: Secondary | ICD-10-CM | POA: Diagnosis not present

## 2022-05-06 DIAGNOSIS — R262 Difficulty in walking, not elsewhere classified: Secondary | ICD-10-CM | POA: Diagnosis not present

## 2022-05-06 DIAGNOSIS — R7881 Bacteremia: Secondary | ICD-10-CM | POA: Diagnosis present

## 2022-05-06 DIAGNOSIS — J9621 Acute and chronic respiratory failure with hypoxia: Secondary | ICD-10-CM | POA: Diagnosis present

## 2022-05-06 DIAGNOSIS — Z86010 Personal history of colonic polyps: Secondary | ICD-10-CM

## 2022-05-06 DIAGNOSIS — I2510 Atherosclerotic heart disease of native coronary artery without angina pectoris: Secondary | ICD-10-CM | POA: Diagnosis not present

## 2022-05-06 DIAGNOSIS — N289 Disorder of kidney and ureter, unspecified: Secondary | ICD-10-CM | POA: Diagnosis not present

## 2022-05-06 DIAGNOSIS — Z882 Allergy status to sulfonamides status: Secondary | ICD-10-CM

## 2022-05-06 DIAGNOSIS — R109 Unspecified abdominal pain: Secondary | ICD-10-CM | POA: Diagnosis not present

## 2022-05-06 DIAGNOSIS — N250 Renal osteodystrophy: Secondary | ICD-10-CM | POA: Diagnosis not present

## 2022-05-06 DIAGNOSIS — Z79899 Other long term (current) drug therapy: Secondary | ICD-10-CM

## 2022-05-06 DIAGNOSIS — R069 Unspecified abnormalities of breathing: Secondary | ICD-10-CM | POA: Diagnosis not present

## 2022-05-06 DIAGNOSIS — F419 Anxiety disorder, unspecified: Secondary | ICD-10-CM | POA: Diagnosis present

## 2022-05-06 DIAGNOSIS — Z800 Family history of malignant neoplasm of digestive organs: Secondary | ICD-10-CM

## 2022-05-06 DIAGNOSIS — T8612 Kidney transplant failure: Secondary | ICD-10-CM | POA: Diagnosis not present

## 2022-05-06 LAB — CBC WITH DIFFERENTIAL/PLATELET
Abs Immature Granulocytes: 0.25 10*3/uL — ABNORMAL HIGH (ref 0.00–0.07)
Basophils Absolute: 0 10*3/uL (ref 0.0–0.1)
Basophils Relative: 0 %
Eosinophils Absolute: 0.1 10*3/uL (ref 0.0–0.5)
Eosinophils Relative: 1 %
HCT: 28.9 % — ABNORMAL LOW (ref 36.0–46.0)
Hemoglobin: 9.2 g/dL — ABNORMAL LOW (ref 12.0–15.0)
Immature Granulocytes: 2 %
Lymphocytes Relative: 6 %
Lymphs Abs: 1 10*3/uL (ref 0.7–4.0)
MCH: 32.4 pg (ref 26.0–34.0)
MCHC: 31.8 g/dL (ref 30.0–36.0)
MCV: 101.8 fL — ABNORMAL HIGH (ref 80.0–100.0)
Monocytes Absolute: 1.4 10*3/uL — ABNORMAL HIGH (ref 0.1–1.0)
Monocytes Relative: 9 %
Neutro Abs: 12.6 10*3/uL — ABNORMAL HIGH (ref 1.7–7.7)
Neutrophils Relative %: 82 %
Platelets: 217 10*3/uL (ref 150–400)
RBC: 2.84 MIL/uL — ABNORMAL LOW (ref 3.87–5.11)
RDW: 17.9 % — ABNORMAL HIGH (ref 11.5–15.5)
WBC: 15.4 10*3/uL — ABNORMAL HIGH (ref 4.0–10.5)
nRBC: 0 % (ref 0.0–0.2)

## 2022-05-06 LAB — COMPREHENSIVE METABOLIC PANEL
ALT: 5 U/L (ref 0–44)
AST: 31 U/L (ref 15–41)
Albumin: 3.2 g/dL — ABNORMAL LOW (ref 3.5–5.0)
Alkaline Phosphatase: 105 U/L (ref 38–126)
Anion gap: 15 (ref 5–15)
BUN: 36 mg/dL — ABNORMAL HIGH (ref 8–23)
CO2: 23 mmol/L (ref 22–32)
Calcium: 10.6 mg/dL — ABNORMAL HIGH (ref 8.9–10.3)
Chloride: 97 mmol/L — ABNORMAL LOW (ref 98–111)
Creatinine, Ser: 4.92 mg/dL — ABNORMAL HIGH (ref 0.44–1.00)
GFR, Estimated: 9 mL/min — ABNORMAL LOW (ref >60)
Glucose, Bld: 140 mg/dL — ABNORMAL HIGH (ref 70–99)
Potassium: 5.3 mmol/L — ABNORMAL HIGH (ref 3.5–5.1)
Sodium: 135 mmol/L (ref 135–145)
Total Bilirubin: 1 mg/dL (ref 0.3–1.2)
Total Protein: 7.3 g/dL (ref 6.5–8.1)

## 2022-05-06 LAB — BRAIN NATRIURETIC PEPTIDE: B Natriuretic Peptide: >4500 pg/mL — ABNORMAL HIGH (ref 0.0–100.0)

## 2022-05-06 LAB — TROPONIN I (HIGH SENSITIVITY)
Troponin I (High Sensitivity): 272 ng/L (ref <18)
Troponin I (High Sensitivity): 816 ng/L (ref <18)
Troponin I (High Sensitivity): 1008 ng/L (ref <18)

## 2022-05-06 LAB — RESP PANEL BY RT-PCR (FLU A&B, COVID) ARPGX2
Influenza A by PCR: NEGATIVE
Influenza B by PCR: NEGATIVE
SARS Coronavirus 2 by RT PCR: NEGATIVE

## 2022-05-06 LAB — LACTIC ACID, PLASMA
Lactic Acid, Venous: 3.5 mmol/L (ref 0.5–1.9)
Lactic Acid, Venous: 1.7 mmol/L (ref 0.5–1.9)

## 2022-05-06 LAB — PROTIME-INR
INR: 1.2 (ref 0.8–1.2)
Prothrombin Time: 14.9 seconds (ref 11.4–15.2)

## 2022-05-06 LAB — HEPARIN LEVEL (UNFRACTIONATED)

## 2022-05-06 MED ORDER — ACETAMINOPHEN 325 MG PO TABS
650.0000 mg | ORAL_TABLET | Freq: Four times a day (QID) | ORAL | Status: DC | PRN
  Administered 2022-05-12 – 2022-05-18 (×4): 650 mg via ORAL
  Filled 2022-05-06 (×4): qty 2

## 2022-05-06 MED ORDER — CAMPHOR-MENTHOL 0.5-0.5 % EX LOTN: 1.0000 "application " | TOPICAL_LOTION | Freq: Three times a day (TID) | CUTANEOUS | Status: DC | PRN

## 2022-05-06 MED ORDER — DOCUSATE SODIUM 283 MG RE ENEM: 1.0000 | ENEMA | RECTAL | Status: DC | PRN

## 2022-05-06 MED ORDER — ATORVASTATIN CALCIUM 80 MG PO TABS
80.0000 mg | ORAL_TABLET | Freq: Every day | ORAL | Status: DC
  Administered 2022-05-07 – 2022-05-22 (×16): 80 mg via ORAL
  Filled 2022-05-06 (×16): qty 1
  Filled 2022-05-06: qty 2

## 2022-05-06 MED ORDER — MIDODRINE HCL 5 MG PO TABS
5.0000 mg | ORAL_TABLET | Freq: Three times a day (TID) | ORAL | Status: DC
  Administered 2022-05-07 – 2022-05-16 (×25): 5 mg via ORAL
  Filled 2022-05-06 (×25): qty 1

## 2022-05-06 MED ORDER — ONDANSETRON HCL 4 MG/2ML IJ SOLN
4.0000 mg | Freq: Four times a day (QID) | INTRAMUSCULAR | Status: DC | PRN
  Administered 2022-05-09: 4 mg via INTRAVENOUS
  Filled 2022-05-06: qty 2

## 2022-05-06 MED ORDER — LIDOCAINE HCL (PF) 1 % IJ SOLN: 5.0000 mL | INTRAMUSCULAR | Status: DC | PRN

## 2022-05-06 MED ORDER — PENTAFLUOROPROP-TETRAFLUOROETH EX AERO: 1.0000 "application " | INHALATION_SPRAY | CUTANEOUS | Status: DC | PRN

## 2022-05-06 MED ORDER — CALCIUM CARBONATE ANTACID 1250 MG/5ML PO SUSP: 500.0000 mg | Freq: Four times a day (QID) | ORAL | Status: DC | PRN

## 2022-05-06 MED ORDER — CHLORHEXIDINE GLUCONATE CLOTH 2 % EX PADS
6.0000 | MEDICATED_PAD | Freq: Every day | CUTANEOUS | Status: DC
  Administered 2022-05-07 – 2022-05-12 (×3): 6 via TOPICAL

## 2022-05-06 MED ORDER — SODIUM CHLORIDE 0.9 % IV SOLN: 100.0000 mL | INTRAVENOUS | Status: DC | PRN

## 2022-05-06 MED ORDER — CINACALCET HCL 30 MG PO TABS
30.0000 mg | ORAL_TABLET | ORAL | Status: DC
  Administered 2022-05-11 – 2022-05-13 (×2): 30 mg via ORAL
  Filled 2022-05-06 (×5): qty 1

## 2022-05-06 MED ORDER — ALBUTEROL SULFATE (2.5 MG/3ML) 0.083% IN NEBU: 2.5000 mg | INHALATION_SOLUTION | Freq: Four times a day (QID) | RESPIRATORY_TRACT | Status: DC | PRN

## 2022-05-06 MED ORDER — LACTATED RINGERS IV SOLN: INTRAVENOUS | Status: DC

## 2022-05-06 MED ORDER — HEPARIN BOLUS VIA INFUSION
2850.0000 [IU] | Freq: Once | INTRAVENOUS | Status: DC
  Filled 2022-05-06: qty 2850

## 2022-05-06 MED ORDER — SORBITOL 70 % SOLN: 30.0000 mL | Status: DC | PRN

## 2022-05-06 MED ORDER — HYDRALAZINE HCL 20 MG/ML IJ SOLN: 5.0000 mg | INTRAMUSCULAR | Status: DC | PRN

## 2022-05-06 MED ORDER — SODIUM CHLORIDE 0.9% FLUSH
3.0000 mL | Freq: Two times a day (BID) | INTRAVENOUS | Status: DC
  Administered 2022-05-06 – 2022-05-20 (×26): 3 mL via INTRAVENOUS

## 2022-05-06 MED ORDER — HEPARIN BOLUS VIA INFUSION
550.0000 [IU] | Freq: Once | INTRAVENOUS | Status: DC
  Filled 2022-05-06: qty 550

## 2022-05-06 MED ORDER — VANCOMYCIN HCL IN DEXTROSE 1-5 GM/200ML-% IV SOLN
1000.0000 mg | Freq: Once | INTRAVENOUS | Status: AC
Start: 2022-05-06 — End: 2022-05-06
  Administered 2022-05-06: 1000 mg via INTRAVENOUS
  Filled 2022-05-06: qty 200

## 2022-05-06 MED ORDER — B COMPLEX-C PO TABS
1.0000 | ORAL_TABLET | Freq: Every day | ORAL | Status: DC
  Administered 2022-05-07 – 2022-05-22 (×15): 1 via ORAL
  Filled 2022-05-06 (×17): qty 1

## 2022-05-06 MED ORDER — ENSURE ENLIVE PO LIQD
237.0000 mL | Freq: Two times a day (BID) | ORAL | Status: DC
  Administered 2022-05-07 – 2022-05-22 (×23): 237 mL via ORAL
  Filled 2022-05-06: qty 237

## 2022-05-06 MED ORDER — DIALYVITE 800 0.8 MG PO WAFR: 1.0000 | WAFER | Freq: Every day | ORAL | Status: DC

## 2022-05-06 MED ORDER — LIDOCAINE-PRILOCAINE 2.5-2.5 % EX CREA: 1.0000 "application " | TOPICAL_CREAM | CUTANEOUS | Status: DC | PRN

## 2022-05-06 MED ORDER — NEPRO/CARBSTEADY PO LIQD
237.0000 mL | Freq: Three times a day (TID) | ORAL | Status: DC | PRN
  Administered 2022-05-09 – 2022-05-10 (×2): 237 mL via ORAL

## 2022-05-06 MED ORDER — CLOPIDOGREL BISULFATE 75 MG PO TABS
75.0000 mg | ORAL_TABLET | Freq: Every day | ORAL | Status: DC
  Administered 2022-05-07 – 2022-05-22 (×16): 75 mg via ORAL
  Filled 2022-05-06 (×17): qty 1

## 2022-05-06 MED ORDER — HEPARIN SODIUM (PORCINE) 5000 UNIT/ML IJ SOLN: 5000.0000 [IU] | Freq: Three times a day (TID) | INTRAMUSCULAR | Status: DC

## 2022-05-06 MED ORDER — HYDROXYZINE HCL 25 MG PO TABS
25.0000 mg | ORAL_TABLET | Freq: Three times a day (TID) | ORAL | Status: DC | PRN
  Administered 2022-05-07: 25 mg via ORAL
  Filled 2022-05-06: qty 1

## 2022-05-06 MED ORDER — FOLIC ACID 1 MG PO TABS
1.0000 mg | ORAL_TABLET | Freq: Every day | ORAL | Status: DC
  Administered 2022-05-07 – 2022-05-22 (×16): 1 mg via ORAL
  Filled 2022-05-06 (×17): qty 1

## 2022-05-06 MED ORDER — CEFEPIME HCL 2 G IV SOLR
2.0000 g | INTRAVENOUS | Status: DC
  Administered 2022-05-06: 2 g via INTRAVENOUS
  Filled 2022-05-06: qty 12.5

## 2022-05-06 MED ORDER — HEPARIN SODIUM (PORCINE) 1000 UNIT/ML DIALYSIS: 1000.0000 [IU] | INTRAMUSCULAR | Status: DC | PRN

## 2022-05-06 MED ORDER — ACETAMINOPHEN 650 MG RE SUPP: 650.0000 mg | Freq: Four times a day (QID) | RECTAL | Status: DC | PRN

## 2022-05-06 MED ORDER — METOPROLOL TARTRATE 12.5 MG HALF TABLET
12.5000 mg | ORAL_TABLET | Freq: Two times a day (BID) | ORAL | Status: DC
  Administered 2022-05-07 – 2022-05-16 (×7): 12.5 mg via ORAL
  Filled 2022-05-06 (×15): qty 1

## 2022-05-06 MED ORDER — PANTOPRAZOLE SODIUM 40 MG PO TBEC
40.0000 mg | DELAYED_RELEASE_TABLET | Freq: Every day | ORAL | Status: DC
  Administered 2022-05-07: 40 mg via ORAL
  Filled 2022-05-06 (×2): qty 1

## 2022-05-06 MED ORDER — ONDANSETRON HCL 4 MG PO TABS: 4.0000 mg | ORAL_TABLET | Freq: Four times a day (QID) | ORAL | Status: DC | PRN

## 2022-05-06 MED ORDER — ESCITALOPRAM OXALATE 10 MG PO TABS
10.0000 mg | ORAL_TABLET | Freq: Every day | ORAL | Status: DC
  Administered 2022-05-07 – 2022-05-22 (×16): 10 mg via ORAL
  Filled 2022-05-06 (×17): qty 1

## 2022-05-06 MED ORDER — SODIUM CHLORIDE 0.9 % IV SOLN
2.0000 g | Freq: Once | INTRAVENOUS | Status: AC
  Administered 2022-05-06: 2 g via INTRAVENOUS
  Filled 2022-05-06: qty 12.5

## 2022-05-06 MED ORDER — ALBUTEROL SULFATE HFA 108 (90 BASE) MCG/ACT IN AERS: 1.0000 | INHALATION_SPRAY | Freq: Four times a day (QID) | RESPIRATORY_TRACT | Status: DC | PRN

## 2022-05-06 MED ORDER — VANCOMYCIN HCL 500 MG/100ML IV SOLN
500.0000 mg | INTRAVENOUS | Status: DC
  Administered 2022-05-06: 500 mg via INTRAVENOUS
  Filled 2022-05-06: qty 100

## 2022-05-06 MED ORDER — SEVELAMER CARBONATE 800 MG PO TABS
1600.0000 mg | ORAL_TABLET | Freq: Three times a day (TID) | ORAL | Status: DC
  Administered 2022-05-06 – 2022-05-12 (×10): 1600 mg via ORAL
  Filled 2022-05-06 (×14): qty 2

## 2022-05-06 MED ORDER — ZOLPIDEM TARTRATE 5 MG PO TABS
5.0000 mg | ORAL_TABLET | Freq: Every evening | ORAL | Status: DC | PRN
  Administered 2022-05-06 – 2022-05-22 (×7): 5 mg via ORAL
  Filled 2022-05-06 (×8): qty 1

## 2022-05-06 NOTE — ED Notes (Signed)
RN cleaned pt and placed brief on

## 2022-05-06 NOTE — Progress Notes (Addendum)
Nephrology Follow-Up Consult note   Assessment/Recommendations: [name] is a/an 71 y.o. female with a past medical history significant for esrd, admitted for hypoxia and concern for infection.      Dialysis Orders: [facility]  4h  350/600  45kg   2/2 bath  P2  AVG RUA  Hep none - last HD 5/12, 48.5 > 46.2kg - last Hb 10.8 on 5/08 - mircera 50 q4 - hectorol 2 ug IV tiw - sensipar 30 tiw po    Acute hypoxic respiratory failure: Likely pulmonary edema.  We will plan for dialysis today or this evening as able based on staffing.  Only on 2 L at this time. Leukocytosis/lactic acidosis: Concerning for infection.  Management per primary team.  ER starting antibiotics.  Recent MSSA sepsis MSSA sepsis/ AVG infection -plan was to continue IV cefazolin until 6/13 with plans for oral cephalosporin thereafter for suppression ESRD - on HD MWF.  AVG required removal vision but still functional.  Plan for dialysis today BP/ volume - has had problems with low blood pressures using midodrine.  Some volume excess today; will stop the IV fluids that are ordered given she is volume overloaded and hypoxic.  Plans for dialysis as above Anemia esrd -hemoglobin 9.2.  Has received ESA recently.  Consider further ESA based on progress BMD ckd - CCa and phos in range. Hypercalcemia will use low Ca bath. Hold hectorol. Continue home sensipar    Recommendations conveyed to primary service.    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  She did require pressors at that time.  She also was intubated.  She had part of her graft removed and was treated with antibiotics prior to discharge.  She last had dialysis on 5/22.  Was due for dialysis today.  She was brought back to the  hospital today from her SNF because of worsening shortness of breath.  She required high levels of supplemental oxygen initially but now down to 2 L.  In the emergency department has demonstrated elevated BNP, elevated troponin, potassium 5.3, lactic acid 3.5, wbc count of 15.4, chest x-ray with pulmonary edema.  I evaluated patient she was lying flat and stated she needed to have a bowel movement but had no other complaints.   Medications:  Current Facility-Administered Medications  Medication Dose Route Frequency Provider Last Rate Last Admin   ceFEPIme (MAXIPIME) 2 g in sodium chloride 0.9 % 100 mL IVPB  2 g Intravenous Once Sofia, Leslie K, PA-C       lactated ringers infusion   Intravenous Continuous Sofia, Leslie K, PA-C       vancomycin (VANCOCIN) IVPB 1000 mg/200 mL premix  1,000 mg Intravenous Once Sofia, Leslie K, [place]       Current Outpatient Medications  Medication Sig Dispense Refill   acetaminophen (TYLENOL) 500 MG tablet Take 1 tablet (500 mg total) by mouth every 6 (six) hours as needed. (Patient taking differently: Take 500 mg by mouth every 6 (six) hours as needed for moderate pain or headache.) 30 tablet 0   albuterol (PROVENTIL) (2.5 MG/3ML) 0.083% nebulizer solution Take 3 mLs (2.5 mg total) by nebulization every 4 (four) hours as needed for wheezing. 75 mL 12   albuterol (VENTOLIN HFA) 108 (90 Base) MCG/ACT inhaler Inhale 1-2 puffs into the lungs every 6 (six) hours  as needed for wheezing or shortness of breath. 1 each 0   atorvastatin (LIPITOR) 80 MG tablet Take 80 mg by mouth daily.     B Complex-C-Folic Acid (DIALYVITE 892) 0.8 MG WAFR Take 1 tablet by mouth daily.     ceFAZolin (ANCEF) 2-4 GM/100ML-% IVPB Inject 100 mLs (2 g total) into the vein every Monday, Wednesday, and Friday at 8 PM for 22 days. Patient will receive this after her HD at HD center. 1 each    [START ON 05/26/2022] cephALEXin (KEFLEX) 500 MG capsule Take 1 capsule (500 mg total) by mouth every evening.  Start on 05/26/2022, continue with seen by ID. 30 capsule 0   clopidogrel (PLAVIX) 75 MG tablet Take 1 tablet (75 mg total) by mouth daily. 30 tablet 11   escitalopram (LEXAPRO) 10 MG tablet Take 10 mg by mouth daily.     feeding supplement (ENSURE ENLIVE / ENSURE PLUS) LIQD Take 237 mLs by mouth 2 (two) times daily between meals. 119 mL 12   folic acid-vitamin b complex-vitamin c-selenium-zinc (DIALYVITE) 3 MG TABS tablet Take 1 tablet by mouth daily.     loperamide (IMODIUM) 2 MG capsule Take 2 mg by mouth daily as needed for diarrhea or loose stools.     midodrine (PROAMATINE) 5 MG tablet Take 1 tablet (5 mg total) by mouth 3 (three) times daily with meals.     ondansetron (ZOFRAN) 8 MG tablet Take 8 mg by mouth 2 (two) times daily as needed for nausea or vomiting.  0   pantoprazole (PROTONIX) 40 MG tablet Take 1 tablet (40 mg total) by mouth daily. 60 tablet    polyethylene glycol (MIRALAX / GLYCOLAX) 17 g packet Take 17 g by mouth daily as needed for mild constipation.     sevelamer (RENAGEL) 800 MG tablet Take 1,600 mg by mouth 3 (three) times daily with meals.     zolpidem (AMBIEN) 10 MG tablet Take 10 mg by mouth at bedtime as needed for sleep.        Review of Systems: 10 systems reviewed and negative except per interval history/subjective  Physical Exam: Vitals:   05/06/22 1100 05/06/22 1130  BP: 128/61 (!) 114/42  Pulse: 81 79  Resp: 16 16  Temp:    SpO2: 99% 99%   No intake/output data recorded. No intake or output data in the 24 hours ending 05/06/22 1156 Constitutional: Chronically ill-appearing, lying in bed, no distress ENMT: ears and nose without scars or lesions, MMM CV: normal rate, no edema Respiratory: Bilateral chest rise, mild increased work of breathing lying flat in bed Gastrointestinal: soft, non-tender, no palpable masses or hernias Skin: no visible lesions or rashes Psych: Awake and alert, not oriented to place or time or situation   Test Results I  personally reviewed new and old clinical labs and radiology tests Lab Results  Component Value Date   NA 135 05/06/2022   K 5.3 (H) 05/06/2022   CL 97 (L) 05/06/2022   CO2 23 05/06/2022   BUN 36 (H) 05/06/2022   CREATININE 4.92 (H) 05/06/2022   CALCIUM 10.6 (H) 05/06/2022   ALBUMIN 3.2 (L) 05/06/2022   PHOS 3.0 05/03/2022    CBC Recent Labs  Lab 05/03/22 2035 05/04/22 0443 05/06/22 0700  WBC 8.5 8.5 15.4*  NEUTROABS  --   --  12.6*  HGB 8.8* 8.9* 9.2*  HCT 25.9* 26.3* 28.9*  MCV 94.9 95.3 101.8*  PLT 218 218 217

## 2022-05-06 NOTE — ED Notes (Signed)
Pt moved to dialysis.

## 2022-05-06 NOTE — Progress Notes (Signed)
Pharmacy Antibiotic Note  [name] is a 71 y.o. female admitted on 05/06/2022 with cellulitis.  Pharmacy has been consulted for vancomycin and cefepime dosing.  Plan: Vancomycin '1000mg'$  IV x 1, followed by  Vancomycin '500mg'$  IV qHD MWF for a total of 7 days Cefepime 2g IV x 1, followed by  Cefepime 2g IV qHD MWF for a total of 7 days F/u HD plans - plan for HD today, adjust schedule as needed Monitor daily WBC, temp, and clinical s/sx of infection F/u cultures and de-escalate antibiotics as able.     Temp (24hrs), Avg:97.2 F (36.2 C), Min:96.9 F (36.1 C), Max:97.4 F (36.3 C)  Recent Labs  Lab 05/01/22 0415 05/02/22 0408 05/03/22 2035 05/04/22 0443 05/06/22 0700 05/06/22 1000  WBC 6.4 7.3 8.5 8.5 15.4*  --   CREATININE 3.31* 2.76* 5.11* 5.54* 4.92*  --   LATICACIDVEN  --   --   --   --   --  3.5*    Estimated Creatinine Clearance: 8 mL/min (A) (by C-G formula based on SCr of 4.92 mg/dL (H)).    Allergies  Allergen Reactions   Sulfa Antibiotics Other (See Comments)    Per patient, both parents allergic-so will not take   Adhesive [Tape] Itching    Antimicrobials this admission: (PTA: cefazolin 2g qHD 5/16 >> 5/23) Cefepime 5/24 >>  Vancomycin 5/24 >>   Dose adjustments this admission: N/A  Microbiology results: 5/24 BCx: pending  Thank you for allowing pharmacy to be a part of this patient's care.  Kaleen Mask 05/06/2022 11:58 AM

## 2022-05-06 NOTE — Consult Note (Addendum)
Cardiology Consultation:   Patient ID: [name] MRN: [id number]; DOB: 27-May-1951  Admit date: 05/06/2022 Date of Consult: 05/06/2022  PCP:  [name], MD   [facility] Providers Cardiologist:  Lauree Chandler, MD   Patient Profile:   [name] is a 71 y.o. female with a hx of ESRD on HD, CHF, CAD, HLD, HTN with recent AV fistula infection who is being seen 05/06/2022 for the evaluation of CHF and elevated troponin at the request of Dr. Lorin Mercy.  History of Present Illness:   [name] has a history of CAD seen on CT chest, no prior heart catheterization.  She has a history of chronic diastolic heart failure, anemia, TIA, and ESRD on HD.  She was hospitalized April 2021 for placement of R UE fistula with postop course complicated by shock, hypotension, hyperkalemia resulting in sustained VT requiring cardioversion to sinus.  Echocardiogram at that time showed new wall motion abnormality felt to be possible stress cardiomyopathy in the setting of hypotension and recent surgery.  LVEF 50 to 63%, grade 1 diastolic dysfunction, moderate to severe TR.  Nuclear stress test March 2021 with no ischemia.  She was admitted June 2021 with chest pain and troponin elevation to 2900.  Chest pain resolved with fluid removal after dialysis.  She refused to consider a cardiac cath or cardiac CTA.  She had a possible TIA in March 2022.  MRI brain negative for stroke.  Echocardiogram March 2022 with LVEF 60 to 65% with apical hypokinesis and no LV thrombus.  She has mild MR.  She was last seen in clinic by [name] 01/13/2022.  She reported dyspnea but she continued to refuse cardiac catheterization.  He felt her probability of CAD was high.  He restarted aspirin 81 mg and 40 mg Lipitor.  She was recently hospitalized 04/26/2022 - 05/04/2022 for AV fistula infection.  On admission she reported a bug bite at her site.  She was taken to the OR for I&D and graft revision on 04/27/2022.  She  became hypotensive requiring Levophed and ultimately intubation.  She was extubated the following day and remained on pressors.  She was treated for septic shock with IV antibiotics, ID was consulted.  HD managed by nephrology and she was discharged with plans to complete her regularly scheduled dialysis session.  She was discharged to SNF with permission to use her AV fistula.  ASA was discontinued and plavix started at discharge.   Unfortunately she was brought back to the ER 05/06/2022 with shortness of breath and acute on chronic respiratory failure.  She is typically on 2 L nasal cannula and was requiring 12 L nonrebreather.  She had increased work of breathing.  Work-up in the ER significant for: Lactic acidosis 3.5 --> 1.7 sCr 4.92 HS troponin 272 --> 816 Hb 9.2 WBC 15.4 K 5.3 BNP > 4500 Blood cultures pending  Cardiology consulted for heart failure and elevated troponin.  I was able to see the patient in dialysis.  She does report chest pain that started last evening.  Chest pain was a pressure in the middle of her chest without radiation.  She was very nauseous and diaphoretic.  She tried to induce vomiting but was unsuccessful.  Chest pain continued to wax and wane throughout the evening.  This morning she became acutely short of breath.  She is not currently having chest pain during dialysis.  She is feeling much better.  We had a discussion regarding her potential CAD and cardiac  enzyme elevation.  She is now agreeable to heart catheterization.  Past Medical History:  Diagnosis Date   Acute ischemic stroke ([place]) 02/23/2017   Acute on chronic diastolic CHF (congestive heart failure) ([place])    Adenomatous polyp of colon 10/2010, 2006, 2015   Anemia in CKD (chronic kidney disease) 11/07/2012   s/p blood transfusion.    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  Had some early rejection then stable function for years, then had slow decline of function and went back on hemodialysis in 2012.  Gets HD TTS schedule at [facility] on [facility] still using L forearm AVF.      GERD (gastroesophageal reflux disease)    GI bleed 2017   felt to be ischemic colitis, last colo 2015   Hyperlipidemia    Hypertension    Neurologic gait dysfunction    Neuromuscular disorder (HCC)    neuropathy hand and legs   NSTEMI (non-ST elevated myocardial infarction) ([place]) 05/14/2020   Osteoporosis    Pneumonia    Pseudoaneurysm of surgical AV fistula (HCC)    left upper arm    Past Surgical History:  Procedure Laterality Date   ABDOMINAL AORTOGRAM W/LOWER EXTREMITY N/A 03/26/2022   Procedure: ABDOMINAL AORTOGRAM W/LOWER EXTREMITY;  Surgeon: Marty Heck, MD;  Location: [place] [facility];  Service: Cardiovascular;  Laterality: N/A;  Left   AV FISTULA PLACEMENT     for dialysis   AV FISTULA PLACEMENT Left 11/22/2015   Procedure: ARTERIOVENOUS (AV) FISTULA CREATION-LEFT BRACHIOCEPHALIC;  Surgeon: Serafina Mitchell, MD;  Location: [place] OR;  Service: Vascular;  Laterality: Left;   AV FISTULA PLACEMENT Right 03/15/2020   Procedure: INSERTION OF ARTERIOVENOUS (AV) GORE-TEX GRAFT ARM ( BRACHIAL AXILLARY );  Surgeon: Katha Cabal, MD;  Location: [facility];  Service: Vascular;  Laterality: Right;   [place] Right 04/27/2022   Procedure: REVISION OF RIGHT ARTERIOVENOUS GRAFT AND EXCISION OF RIGHT DEGENERATED GRAFT (AVG);  Surgeon: Angelia Mould, MD;  Location: [place];  Service: Vascular;  Laterality: Right;   BACK SURGERY     CERVICAL FUSION     CHOLECYSTECTOMY  12/02/2012   Procedure: LAPAROSCOPIC CHOLECYSTECTOMY WITH INTRAOPERATIVE CHOLANGIOGRAM;   Surgeon: Edward Jolly, MD;  Location: [place];  Service: General;  Laterality: N/A;   EYE SURGERY Bilateral    cataract surgery   EYE SURGERY Left 2019   laser   HEMATOMA EVACUATION Left 12/24/2016   Procedure: EVACUATION HEMATOMA LEFT UPPER ARM;  Surgeon: Waynetta Sandy, MD;  Location: [place];  Service: Vascular;  Laterality: Left;   I & D EXTREMITY Left 12/31/2016   Procedure: IRRIGATION AND DEBRIDEMENT EXTREMITY;  Surgeon: Angelia Mould, MD;  Location: [place];  Service: Vascular;  Laterality: Left;   INSERTION OF DIALYSIS CATHETER Right 12/24/2016   Procedure: INSERTION OF DIALYSIS CATHETER;  Surgeon: Waynetta Sandy, MD;  Location: [place];  Service: Vascular;  Laterality: Right;   INSERTION OF DIALYSIS CATHETER Right 02/04/2017   Procedure: INSERTION OF DIALYSIS CATHETER;  Surgeon: Waynetta Sandy, MD;  Location: [place];  Service: Vascular;  Laterality: Right;   KIDNEY TRANSPLANT  1996   PERIPHERAL VASCULAR CATHETERIZATION Left 10/23/2016   Procedure: Fistulagram;  Surgeon: Elam Dutch, MD;  Location: [place] [facility];  Service: Cardiovascular;  Laterality: Left;   PERIPHERAL VASCULAR INTERVENTION  03/26/2022   Procedure: PERIPHERAL VASCULAR INTERVENTION;  Surgeon: Marty Heck, MD;  Location: [place] [facility];  Service: Cardiovascular;;  left sfa   PERIPHERAL VASCULAR THROMBECTOMY Right 04/16/2020   Procedure: PERIPHERAL VASCULAR THROMBECTOMY;  Surgeon: Katha Cabal, MD;  Location: [place] [facility];  Service: Cardiovascular;  Laterality: Right;   RESECTION OF ARTERIOVENOUS FISTULA ANEURYSM Left 11/22/2015   Procedure: RESECTION OF LEFT RADIOCEPHALIC FISTULA ANEURYSM ;  Surgeon: Serafina Mitchell, MD;  Location: [place] [facility];  Service: Vascular;  Laterality: Left;   REVISON OF ARTERIOVENOUS FISTULA Left 12/22/2016   Procedure: REVISON OF LEFT ARTERIOVENOUS FISTULA;  Surgeon: Waynetta Sandy, MD;  Location: [place];  Service: Vascular;   Laterality: Left;   REVISON OF ARTERIOVENOUS FISTULA Left 02/04/2017   Procedure: REVISON OF LEFT UPPER ARM ARTERIOVENOUS FISTULA;  Surgeon: Waynetta Sandy, MD;  Location: [place];  Service: Vascular;  Laterality: Left;   UPPER EXTREMITY ANGIOGRAPHY Bilateral 09/19/2019   Procedure: UPPER EXTREMITY ANGIOGRAPHY;  Surgeon: Katha Cabal, MD;  Location: [place] [facility];  Service: Cardiovascular;  Laterality: Bilateral;     Home Medications:  Prior to Admission medications   Medication Sig Start Date End Date Taking? Authorizing Provider  acetaminophen (TYLENOL) 500 MG tablet Take 1 tablet (500 mg total) by mouth every 6 (six) hours as needed. Patient taking differently: Take 500 mg by mouth every 6 (six) hours as needed (pain). 09/05/18  Yes Law, Raven M, PA-C  albuterol (PROVENTIL) (2.5 MG/3ML) 0.083% nebulizer solution Take 3 mLs (2.5 mg total) by nebulization every 4 (four) hours as needed for wheezing. Patient taking differently: Take 2.5 mg by nebulization every 6 (six) hours as needed for wheezing. 05/04/22  Yes Elgergawy, Silver Huguenin, MD  albuterol (VENTOLIN HFA) 108 (90 Base) MCG/ACT inhaler Inhale 1-2 puffs into the lungs every 6 (six) hours as needed for wheezing or shortness of breath. Patient taking differently: Inhale 2 puffs into the lungs every 6 (six) hours as needed for shortness of breath. 01/11/22  Yes Mesner, Corene Cornea, MD  atorvastatin (LIPITOR) 80 MG tablet Take 80 mg by mouth at bedtime. 03/25/22  Yes [provider]  B Complex-C-Folic Acid (DIALYVITE 742) 0.8 MG WAFR Take 1 tablet by mouth every morning. 01/28/22  Yes [provider]  clopidogrel (PLAVIX) 75 MG tablet Take 1 tablet (75 mg total) by mouth daily. Patient taking differently: Take 75 mg by mouth at bedtime. 03/26/22 03/26/23 Yes Marty Heck, MD  escitalopram (LEXAPRO) 10 MG tablet Take 10 mg by mouth every morning. 03/25/22  Yes [provider]  loperamide (IMODIUM) 2 MG  capsule Take 2 mg by mouth daily as needed for diarrhea or loose stools.   Yes [provider]  midodrine (PROAMATINE) 5 MG tablet Take 1 tablet (5 mg total) by mouth 3 (three) times daily with meals. 05/04/22  Yes Elgergawy, Silver Huguenin, MD  Nutritional Supplements (NEPRO) LIQD Take 237 mLs by mouth 2 (two) times daily between meals.   Yes [provider]  ondansetron (ZOFRAN) 8 MG tablet Take 8 mg by mouth every 12 (twelve) hours as needed for nausea or vomiting.   Yes [provider]  OVER THE COUNTER MEDICATION Take 1 tablet by mouth every morning. Folic acid - vitamin B complex -  vitamin C - selenium - zinc 3 mg   Yes [provider]  pantoprazole (PROTONIX) 40 MG tablet Take 1 tablet (40 mg total) by mouth daily. 08/18/21  Yes Nita Sells, MD  polyethylene glycol (MIRALAX / GLYCOLAX) 17 g packet Take 17 g by mouth daily as needed for mild constipation. 05/04/22  Yes Elgergawy, Silver Huguenin, MD  sevelamer (RENAGEL) 800 MG tablet Take 1,600 mg by mouth 3 (three) times daily with meals. 03/24/22  Yes [provider]  ceFAZolin (ANCEF) 2-4 GM/100ML-% IVPB Inject 100 mLs (2 g total) into the vein every Monday, Wednesday, and Friday at 8 PM for 22 days. Patient will receive this after her HD at HD center. Patient not taking: Reported on 05/06/2022 05/04/22 05/26/22  Elgergawy, Silver Huguenin, MD  cephALEXin (KEFLEX) 500 MG capsule Take 1 capsule (500 mg total) by mouth every evening. Start on 05/26/2022, continue with seen by ID. Patient not taking: Reported on 05/06/2022 05/26/22 06/25/22  Elgergawy, Silver Huguenin, MD  feeding supplement (ENSURE ENLIVE / ENSURE PLUS) LIQD Take 237 mLs by mouth 2 (two) times daily between meals. Patient not taking: Reported on 05/06/2022 01/24/22   Swayze, Ava, DO  zolpidem (AMBIEN) 10 MG tablet Take 10 mg by mouth at bedtime. Patient not taking: Reported on 05/06/2022 02/10/22   [provider]    Inpatient Medications: Scheduled  Meds:  [START ON 05/07/2022] atorvastatin  80 mg Oral Daily   [START ON 05/07/2022] Chlorhexidine Gluconate Cloth  6 each Topical Q0600   [START ON 05/08/2022] cinacalcet  30 mg Oral Q M,W,F-HD   [START ON 05/07/2022] clopidogrel  75 mg Oral Daily   [START ON 05/07/2022] Dialyvite 800  1 tablet Oral Daily   [START ON 05/07/2022] escitalopram  10 mg Oral Daily   feeding supplement  237 mL Oral BID BM   heparin  2,850 Units Intravenous Once   heparin  550 Units Intravenous Once   midodrine  5 mg Oral TID WC   [START ON 05/07/2022] pantoprazole  40 mg Oral Daily   sevelamer carbonate  1,600 mg Oral TID WC   sodium chloride flush  3 mL Intravenous Q12H   Continuous Infusions:  sodium chloride     sodium chloride     ceFEPime (MAXIPIME) IV     vancomycin     PRN Meds: sodium chloride, sodium chloride, acetaminophen **OR** acetaminophen, albuterol, calcium carbonate (dosed in mg elemental calcium), camphor-menthol **AND** hydrOXYzine, docusate sodium, feeding supplement (NEPRO CARB STEADY), heparin, hydrALAZINE, lidocaine (PF), lidocaine-prilocaine, ondansetron **OR** ondansetron (ZOFRAN) IV, pentafluoroprop-tetrafluoroeth, sorbitol, zolpidem  Allergies:    Allergies  Allergen Reactions   Sulfa Antibiotics Other (See Comments)    Per patient, both parents allergic-so will not take   Adhesive [Tape] Itching    Social History:   Social History   Socioeconomic History   Marital status: Widowed    Spouse name: Not on file   Number of children: 2   Years of education: Not on file   Highest education level: Not on file  Occupational History   Not on file  Tobacco Use   Smoking status: Former    Types: Cigarettes    Quit date: 12/31/1991    Years since quitting: 30.3   Smokeless tobacco: Never  Vaping Use   Vaping Use: Never used  Substance and Sexual Activity   Alcohol use: No    Alcohol/week: 0.0 standard drinks   Drug use: No   Sexual activity: Not Currently  Other Topics  Concern   Not on file  Social History Narrative   Living with her mother    Right handed   Caffeine: only decaf clears ("no brown sodas" or coffee d/t kidney disease)   Low potassium foods d/t kidney disease   Social Determinants of Health   Financial Resource Strain: Not on file  Food Insecurity: Not on file  Transportation Needs: Not on file  Physical Activity: Not on file  Stress: Not on file  Social Connections: Not on file  Intimate Partner Violence: Not on file    Family History:    Family History  Problem Relation Age of Onset   Colon cancer Brother    Cancer Brother    Coronary artery disease Mother 52   Hyperlipidemia Mother    Hypertension Mother    Stroke Maternal Aunt    Esophageal cancer Neg Hx    Stomach cancer Neg Hx    Rectal cancer Neg Hx      ROS:  Please see the history of present illness.   All other ROS reviewed and negative.     Physical Exam/Data:   Vitals:   05/06/22 1548 05/06/22 1600 05/06/22 1617 05/06/22 1630  BP: (!) 160/32  (!) 136/38 (!) 120/49  Pulse: 77 74 72   Resp: '18 18 18 17  '$ Temp: 97.6 F (36.4 C)     TempSrc: Oral     SpO2: 100% 100% 99%    No intake or output data in the 24 hours ending 05/06/22 1658    05/04/2022   11:30 AM 05/04/2022    7:40 AM 05/04/2022    5:09 AM  Last 3 Weights  Weight (lbs) 106 lb 4.2 oz 112 lb 14 oz 114 lb 3.2 oz  Weight (kg) 48.2 kg 51.2 kg 51.8 kg     There is no height or weight on file to calculate BMI.  General: Resting in dialysis HEENT: normal Neck: + JVD Vascular: No carotid bruits; Distal pulses 2+ bilaterally Cardiac:  normal S1, S2; RRR; no murmur  Lungs:  clear to auscultation bilaterally, no wheezing, rhonchi or rales  Abd: soft, nontender, no hepatomegaly  Ext: no edema Musculoskeletal:  No deformities, BUE and BLE strength normal and equal Skin: warm and dry  Neuro:  CNs 2-12 intact, no focal abnormalities noted Psych:  Normal affect   EKG:  The EKG was personally  reviewed and demonstrates:  sinus rhythm HR 93 prolonged PR Telemetry:  Telemetry was personally reviewed and demonstrates:  sinus rhythm HR 80s  Relevant CV Studies:  Echo 04/2022:  1. No left ventricular thrombus is seen. Left ventricular ejection  fraction, by estimation, is 50 to 55%. The left ventricle has low normal  function. The left ventricle demonstrates regional wall motion  abnormalities (see scoring diagram/findings for  description). There is moderate hypokinesis of the left ventricular,  entire inferolateral wall.   2. The right ventricular size is moderately enlarged. There is severely  elevated pulmonary artery systolic pressure.   3. Left atrial size was moderately dilated.   4. Right atrial size was moderately dilated.   5. Mild to moderate mitral valve regurgitation. No evidence of mitral  stenosis.   6. Tricuspid valve regurgitation is moderate to severe.   7. The aortic valve is tricuspid. There is moderate calcification of the  aortic valve. There is moderate thickening of the aortic valve. Aortic  valve regurgitation is trivial. Aortic valve sclerosis/calcification is  present, without any evidence of  aortic stenosis.  8. The inferior vena cava is dilated in size with <50% respiratory  variability, suggesting right atrial pressure of 15 mmHg.   Laboratory Data:  High Sensitivity Troponin:   Recent Labs  Lab 05/06/22 1000 05/06/22 1250  TROPONINIHS 272* 816*     Chemistry Recent Labs  Lab 05/03/22 2035 05/04/22 0443 05/06/22 0700  NA 129* 131* 135  K 4.2 4.4 5.3*  CL 93* 95* 97*  CO2 '24 24 23  '$ GLUCOSE 117* 92 140*  BUN 33* 37* 36*  CREATININE 5.11* 5.54* 4.92*  CALCIUM 9.7 9.9 10.6*  GFRNONAA 9* 8* 9*  ANIONGAP '12 12 15    '$ Recent Labs  Lab 05/03/22 2035 05/06/22 0700  PROT  --  7.3  ALBUMIN 2.5* 3.2*  AST  --  31  ALT  --  5  ALKPHOS  --  105  BILITOT  --  1.0   Lipids No results for input(s): CHOL, TRIG, HDL, LABVLDL, LDLCALC,  CHOLHDL in the last 168 hours.  Hematology Recent Labs  Lab 05/03/22 2035 05/04/22 0443 05/06/22 0700  WBC 8.5 8.5 15.4*  RBC 2.73* 2.76* 2.84*  HGB 8.8* 8.9* 9.2*  HCT 25.9* 26.3* 28.9*  MCV 94.9 95.3 101.8*  MCH 32.2 32.2 32.4  MCHC 34.0 33.8 31.8  RDW 16.7* 16.6* 17.9*  PLT 218 218 217   Thyroid No results for input(s): TSH, FREET4 in the last 168 hours.  BNP Recent Labs  Lab 05/06/22 1000  BNP >4,500.0*    DDimer No results for input(s): DDIMER in the last 168 hours.   Radiology/Studies:  DG Chest Port 1 View  Result Date: 05/06/2022 CLINICAL DATA:  Shortness of breath EXAM: PORTABLE CHEST 1 VIEW COMPARISON:  Nine days ago FINDINGS: Cardiomegaly and vascular pedicle widening with diffuse interstitial coarsening including Kerley lines. No visible effusion or pneumothorax. Artifact from EKG leads. IMPRESSION: CHF pattern. Electronically Signed   By: Jorje Guild M.D.   On: 05/06/2022 07:11     Assessment and Plan:   Chronic diastolic heart failure ESRD on HD Volume management per HD Echocardiogram 02/22/2021 with LVEF 60 to 65% and grade 2 diastolic dysfunction.  Moderate TR.  History of possible stress cardiomyopathy.   NSTEMI CAD Hyperlipidemia with LDL goal < 70 HS troponin 272 --> 816 EKG with difficult baseline, appears sinus She does report chest pain since last evening that progressed to dyspnea with diaphoresis and nausea. She feels much better with onset of HD. May represent angina in a patient with known CAD on CT chest. Given recent infection, will treat medically for now with 48 hr heparin. Pt on midodrine at baseline, but hypertensive here. Will start 12.5 mg metoprolol BID.  Increase lipitor to 80 mg Repeat lipid panel tomorrow morning.    Hx of TIA PTA: plavix - continue   Risk Assessment/Risk Scores:     TIMI Risk Score for Unstable Angina or Non-ST Elevation MI:   The patient's TIMI risk score is 4, which indicates a 20% risk of all  cause mortality, new or recurrent myocardial infarction or need for urgent revascularization in the next 14 days.          For questions or updates, please contact [place] Please consult [email or web address] for contact info under    Signed, Ledora Bottcher, PA  05/06/2022 4:58 PM  Personally seen and examined. Agree with above.  Elevated troponin from 200-800.  Lactic acidosis.  Currently getting hemodialysis.  Recent AV fistula infection requiring I&D.  I think  at this point it is very reasonable to give heparin IV for the next 48 hours.  Back in June 2021 she had a chest pain and troponin elevation of 2900.  At that time, chest pain resolved after hemodialysis.  Previously had refused cardiac catheterization or CT.  She seems to be amenable at this point to catheterization potentially however I do not feel she is a good candidate at this time especially with recent infection, white count elevation, DNR status.    Candee Furbish, MD

## 2022-05-06 NOTE — ED Notes (Addendum)
Dialysis RN called this RN. This RN giving report--dialysis requested to confirm with admit MD that pt is stable enough to go to dialysis due to lab work. RN send secure message awaiting response. Pt remains alert and responsive.

## 2022-05-06 NOTE — ED Provider Notes (Signed)
[facility] DEPARTMENT Provider Note   CSN: [id number] Arrival date & time:        History  Chief Complaint  Patient presents with  . Shortness of Breath    [name] is a 71 y.o. female.  EMS called to facility due to pt being hypoxic.  Pt due for dialysis today.  EMS reported pulse ox low.  Pt placed on NRB at 12 liters.  Pt discharged from hospital on 5/22 after being diagnosed with sepsis and an infected right AV fistula.  Pt is on IV antibiotics.  Ancef is suppose to be given on MWF at dialysis. Pt last dosage was Monday.   The history is provided by the patient. No language interpreter was used.  Shortness of Breath Severity:  Moderate Onset quality:  Sudden Duration:  1 hour Timing:  Constant Progression:  Improving Chronicity:  New Relieved by:  Nothing Worsened by:  Nothing Ineffective treatments:  Oxygen Associated symptoms: no abdominal pain, no chest pain and no fever  dosage was Monday.       Home Medications Prior to Admission medications   Medication Sig Start Date End Date Taking? Authorizing Provider  acetaminophen (TYLENOL) 500 MG tablet Take 1 tablet (500 mg total) by mouth every 6 (six) hours as needed. Patient taking differently: Take 500 mg by mouth every 6 (six) hours as needed for moderate pain or headache. 09/05/18   Law, Bea Graff, PA-C  albuterol (PROVENTIL) (2.5 MG/3ML) 0.083% nebulizer solution Take 3 mLs (2.5 mg total) by nebulization every 4 (four) hours as needed for wheezing. 05/04/22   Elgergawy, Silver Huguenin, MD  albuterol (VENTOLIN HFA) 108 (90 Base) MCG/ACT inhaler Inhale 1-2 puffs into the lungs every 6 (six) hours as needed for wheezing or shortness of breath. 01/11/22   Mesner, Corene Cornea, MD  atorvastatin (LIPITOR) 80 MG tablet Take 80 mg by mouth daily. 03/25/22   [provider]  B Complex-C-Folic Acid (DIALYVITE 295) 0.8 MG WAFR Take 1 tablet by mouth daily. 01/28/22   [provider]   ceFAZolin (ANCEF) 2-4 GM/100ML-% IVPB Inject 100 mLs (2 g total) into the vein every Monday, Wednesday, and Friday at 8 PM for 22 days. Patient will receive this after her HD at HD center. 05/04/22 05/26/22  Elgergawy, Silver Huguenin, MD  cephALEXin (KEFLEX) 500 MG capsule Take 1 capsule (500 mg total) by mouth every evening. Start on 05/26/2022, continue with seen by ID. 05/26/22 06/25/22  Elgergawy, Silver Huguenin, MD  clopidogrel (PLAVIX) 75 MG tablet Take 1 tablet (75 mg total) by mouth daily. 03/26/22 03/26/23  Marty Heck, MD  escitalopram (LEXAPRO) 10 MG tablet Take 10 mg by mouth daily. 03/25/22   [provider]  feeding supplement (ENSURE ENLIVE / ENSURE PLUS) LIQD Take 237 mLs by mouth 2 (two) times daily between meals. 01/24/22   Swayze, Ava, DO  folic acid-vitamin b complex-vitamin c-selenium-zinc (DIALYVITE) 3 MG TABS tablet Take 1 tablet by mouth daily.    [provider]  loperamide (IMODIUM) 2 MG capsule Take 2 mg by mouth daily as needed for diarrhea or loose stools.    [provider]  midodrine (PROAMATINE) 5 MG tablet Take 1 tablet (5 mg total) by mouth 3 (three) times daily with meals. 05/04/22   Elgergawy, Silver Huguenin, MD  ondansetron (ZOFRAN) 8 MG tablet Take 8 mg by mouth 2 (two) times daily as needed for nausea or vomiting.    [provider]  pantoprazole ([place])  40 MG tablet Take 1 tablet (40 mg total) by mouth daily. 08/18/21   Nita Sells, MD  polyethylene glycol (MIRALAX / GLYCOLAX) 17 g packet Take 17 g by mouth daily as needed for mild constipation. 05/04/22   Elgergawy, Silver Huguenin, MD  sevelamer (RENAGEL) 800 MG tablet Take 1,600 mg by mouth 3 (three) times daily with meals. 03/24/22   [provider]  zolpidem (AMBIEN) 10 MG tablet Take 10 mg by mouth at bedtime as needed for sleep. 02/10/22   [provider]      Allergies    Sulfa antibiotics and Adhesive [tape]    Review of Systems   Review of Systems   Constitutional:  Negative for fever.  HENT:  Negative for congestion.   Respiratory:  Positive for shortness of breath.   Cardiovascular:  Negative for chest pain.  Gastrointestinal:  Negative for abdominal pain.  Neurological:  Positive for weakness.  All other systems reviewed and are negative.  Physical Exam Updated Vital Signs BP (!) 133/49   Pulse 80   Temp (!) 96.9 F (36.1 C) (Axillary)   Resp 13   SpO2 100%  Physical Exam Vitals and nursing note reviewed.  Constitutional:      Appearance: She is well-developed.  HENT:     Head: Normocephalic.  Cardiovascular:     Rate and Rhythm: Normal rate and regular rhythm.  Pulmonary:     Effort: Pulmonary effort is normal.     Breath sounds: No decreased breath sounds.  Abdominal:     General: There is no distension.     Palpations: Abdomen is soft.  Musculoskeletal:        General: Normal range of motion.     Cervical back: Normal range of motion.  Skin:    General: Skin is warm.  Neurological:     General: No focal deficit present.     Mental Status: She is alert.  Psychiatric:        Mood and Affect: Mood normal.    ED Results / Procedures / Treatments   Labs (all labs ordered are listed, but only abnormal results are displayed) Labs Reviewed  CBC WITH DIFFERENTIAL/PLATELET - Abnormal; Notable for the following components:      Result Value   WBC 15.4 (*)    RBC 2.84 (*)    Hemoglobin 9.2 (*)    HCT 28.9 (*)    MCV 101.8 (*)    RDW 17.9 (*)    Neutro Abs 12.6 (*)    Monocytes Absolute 1.4 (*)    Abs Immature Granulocytes 0.25 (*)    All other components within normal limits  COMPREHENSIVE METABOLIC PANEL - Abnormal; Notable for the following components:   Potassium 5.3 (*)    Chloride 97 (*)    Glucose, Bld 140 (*)    BUN 36 (*)    Creatinine, Ser 4.92 (*)    Calcium 10.6 (*)    Albumin 3.2 (*)    GFR, Estimated 9 (*)    All other components within normal limits  LACTIC ACID, PLASMA - Abnormal;  Notable for the following components:   Lactic Acid, Venous 3.5 (*)    All other components within normal limits  BRAIN NATRIURETIC PEPTIDE - Abnormal; Notable for the following components:   B Natriuretic Peptide >4,500.0 (*)    All other components within normal limits  TROPONIN I (HIGH SENSITIVITY) - Abnormal; Notable for the following components:   Troponin I (High Sensitivity) 272 (*)  All other components within normal limits  RESP PANEL BY RT-PCR (FLU A&B, COVID) ARPGX2  CULTURE, BLOOD (ROUTINE X 2)  CULTURE, BLOOD (ROUTINE X 2)  PROTIME-INR  LACTIC ACID, PLASMA  TROPONIN I (HIGH SENSITIVITY)    EKG EKG Interpretation  Date/Time:  Wednesday May 06 2022 06:38:59 EDT Ventricular Rate:  93 PR Interval:  209 QRS Duration: 100 QT Interval:  371 QTC Calculation: 462 R Axis:   95 Text Interpretation: Sinus rhythm Borderline prolonged PR interval Right axis deviation Confirmed by Godfrey Pick ([id number]) on 05/06/2022 10:33:30 AM  Radiology DG Chest Port 1 View  Result Date: 05/06/2022 CLINICAL DATA:  Shortness of breath EXAM: PORTABLE CHEST 1 VIEW COMPARISON:  Nine days ago FINDINGS: Cardiomegaly and vascular pedicle widening with diffuse interstitial coarsening including Kerley lines. No visible effusion or pneumothorax. Artifact from EKG leads. IMPRESSION: CHF pattern. Electronically Signed   By: Jorje Guild M.D.   On: 05/06/2022 07:11    Procedures Procedures    Medications Ordered in ED Medications - No data to display  ED Course/ Medical Decision Making/ A&P                           Medical Decision Making Amount and/or Complexity of Data Reviewed Independent Historian: EMS    Details: EMS reports low 02 sats.  Pt improved with oxygen External Data Reviewed: notes.    Details: Hospitalist, Nephrology, Infectious disease and Vascular notes reviewed. Labs: ordered. Decision-making details documented in ED Course.    Details: Labs ordered reviewed adn interpreted  Pt has an elevated wbc count.  Potassium is elevated at 5.3.  Pt has an elevated lactic acid of 3.5 Radiology: ordered and independent interpretation performed. Decision-making details documented in ED Course.    Details: Chest xray shows CHF pattern ECG/medicine tests: ordered and independent interpretation performed. Decision-making details documented in ED Course. Discussion of management or test interpretation with external provider(s): Dr. Osborne Casco Nephrology will set pt up for dialysis today Hospitaltis consulted for admission   Risk Prescription drug management.            Final Clinical Impression(s) / ED Diagnoses Final diagnoses:  Sepsis, due to unspecified organism, unspecified whether acute organ dysfunction present [facility])  Stage 5 chronic kidney disease on chronic dialysis [facility])  Hyperkalemia    Rx / DC Orders ED Discharge Orders     None         Sidney Ace 05/06/22 1513    Godfrey Pick, MD 05/09/22 [phone]

## 2022-05-06 NOTE — ED Triage Notes (Signed)
Pt BIB [facility] from [facility] c/o SOB. Pt typically on 2L [place]. However, EMS placed pt on 12L NRB d/t pt using accessory muscles. Pt placed back on 2L in ED with O2 sats 96%. Pt A&Ox4.

## 2022-05-06 NOTE — ED Notes (Addendum)
RN spoke with MD. Pt has 3 fistulas, one R forearm, one L forearm and one LUA. RN unsure if able to start IV and give medications in arms that have fistulas. MD stated will look into it and possibly needing EJ IV. RN will hold meds at this time until confirmation from MD

## 2022-05-06 NOTE — Progress Notes (Signed)
ANTICOAGULATION CONSULT NOTE - Initial Consult  Pharmacy Consult for heparin Indication: chest pain/ACS  Allergies  Allergen Reactions   Sulfa Antibiotics Other (See Comments)    Per patient, both parents allergic-so will not take   Adhesive [Tape] Itching    Patient Measurements:   Heparin Dosing Weight: 48.2 kg  Vital Signs: Temp: 97.4 F (36.3 C) (05/24 0937) Temp Source: Rectal (05/24 0937) BP: 138/57 (05/24 1300) Pulse Rate: 77 (05/24 1315)  Labs: Recent Labs    05/03/22 2035 05/04/22 0443 05/06/22 0700 05/06/22 1000 05/06/22 1250  HGB 8.8* 8.9* 9.2*  --   --   HCT 25.9* 26.3* 28.9*  --   --   PLT 218 218 217  --   --   LABPROT  --   --   --  14.9  --   INR  --   --   --  1.2  --   CREATININE 5.11* 5.54* 4.92*  --   --   TROPONINIHS  --   --   --  272* 816*    Estimated Creatinine Clearance: 8 mL/min (A) (by C-G formula based on SCr of 4.92 mg/dL (H)).   Medical History: Past Medical History:  Diagnosis Date   Acute ischemic stroke ([place]) 02/23/2017   Acute on chronic diastolic CHF (congestive heart failure) ([place])    Adenomatous polyp of colon 10/2010, 2006, 2015   Anemia in CKD (chronic kidney disease) 11/07/2012   s/p blood transfusion.    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  Had some early rejection then stable function for years, then had slow decline of function and went back on hemodialysis in 2012.  Gets HD TTS schedule at [facility] still using L forearm AVF.      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 Patient is HD MWF as baseline. No AC PTA. Pharmacy consulted to dose heparin for possible ACS.  Troponin 816, BNP > 4,5000, Hgb 9.2, plts 217. CBC stable.   Goal of Therapy:  Heparin level 0.3-0.7 units/ml Monitor platelets by anticoagulation protocol: Yes   Plan:  Give 2850 units bolus x 1 Start heparin infusion at 550 units/hr Check anti-Xa level in 8 hours and daily while on heparin Continue to monitor H&H and platelets  Anderson Malta A Ronella Plunk 05/06/2022,2:42 PM

## 2022-05-06 NOTE — ED Notes (Signed)
Pt had BM , RN cleaned pt up and placed new brief

## 2022-05-06 NOTE — H&P (Signed)
History and Physical    Patient: [name]:[id number] DOB: 11-06-1951 DOA: 05/06/2022 DOS: the patient was seen and examined on 05/06/2022 PCP: [name], MD  Patient coming from: SNF - [facility]; NOK: [place] [facility], [phone]   Chief Complaint: SOB  HPI: [name] is a 71 y.o. female with medical history significant of CVA; chronic diastolic CHF; CAD; ESRD on MWF HD; HTN; HLD; and PVD presenting with SOB.  She was last hospitalized from 5/14-23 with septic shock due to staph bacteremia from an AV graft infection.  Patient reports that she was dialyzed last on Monday but there was concern that she needed extra fluid taken off.  She has been increasingly SOB since.  Placed on NRB O2 -> 2L.  No fevers.  No apparent infection.    ER Course:  Discharged 2 days ago due to AV graft infection.  On Ancef MWF at HD but today didn't make it.  She was somnolent, O2 in low 80s.  Placed on NRB O2, tapered here to 2L.  Has EJ on the R.  K+ elevated, lactate elevated.  Nephrology will do HD.  Needs more antibiotics.       Review of Systems: As mentioned in the history of present illness. All other systems reviewed and are negative. Past Medical History:  Diagnosis Date   Acute ischemic stroke ([place]) 02/23/2017   Acute on chronic diastolic CHF (congestive heart failure) ([place])    Adenomatous polyp of colon 10/2010, 2006, 2015   Anemia in CKD (chronic kidney disease) 11/07/2012   s/p blood transfusion.    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  Had some early rejection then stable function for years, then had slow decline of function and  went back on hemodialysis in 2012.  Gets HD TTS schedule at [facility] on [facility] still using L forearm AVF.      GERD (gastroesophageal reflux disease)    GI bleed 2017   felt to be ischemic colitis, last colo 2015   Hyperlipidemia    Hypertension    Neurologic gait dysfunction    Neuromuscular disorder (HCC)    neuropathy hand and legs   NSTEMI (non-ST elevated myocardial infarction) ([place]) 05/14/2020   Osteoporosis    Pneumonia    Pseudoaneurysm of surgical AV fistula (HCC)    left upper arm   Past Surgical History:  Procedure Laterality Date   ABDOMINAL AORTOGRAM W/LOWER EXTREMITY N/A 03/26/2022   Procedure: ABDOMINAL AORTOGRAM W/LOWER EXTREMITY;  Surgeon: Marty Heck, MD;  Location: [place] [facility];  Service: Cardiovascular;  Laterality: N/A;  Left   AV FISTULA PLACEMENT     for dialysis   AV FISTULA PLACEMENT Left 11/22/2015   Procedure: ARTERIOVENOUS (AV) FISTULA CREATION-LEFT BRACHIOCEPHALIC;  Surgeon: Serafina Mitchell, MD;  Location: [place];  Service: Vascular;  Laterality: Left;   AV FISTULA PLACEMENT Right 03/15/2020   Procedure: INSERTION OF ARTERIOVENOUS (AV) GORE-TEX GRAFT ARM ( BRACHIAL AXILLARY );  Surgeon: Katha Cabal, MD;  Location: [facility];  Service: Vascular;  Laterality: Right;   [place] Right 04/27/2022   Procedure: REVISION OF RIGHT ARTERIOVENOUS GRAFT  AND EXCISION OF RIGHT DEGENERATED GRAFT (AVG);  Surgeon: Angelia Mould, MD;  Location: [place];  Service: Vascular;  Laterality: Right;   BACK SURGERY     CERVICAL FUSION     CHOLECYSTECTOMY  12/02/2012   Procedure: LAPAROSCOPIC CHOLECYSTECTOMY WITH INTRAOPERATIVE CHOLANGIOGRAM;  Surgeon: Edward Jolly, MD;  Location: [place];  Service: General;  Laterality: N/A;   EYE SURGERY Bilateral    cataract surgery   EYE SURGERY Left 2019   laser   HEMATOMA EVACUATION Left 12/24/2016   Procedure: EVACUATION HEMATOMA LEFT UPPER ARM;  Surgeon: Waynetta Sandy, MD;  Location: [place];   Service: Vascular;  Laterality: Left;   I & D EXTREMITY Left 12/31/2016   Procedure: IRRIGATION AND DEBRIDEMENT EXTREMITY;  Surgeon: Angelia Mould, MD;  Location: [place];  Service: Vascular;  Laterality: Left;   INSERTION OF DIALYSIS CATHETER Right 12/24/2016   Procedure: INSERTION OF DIALYSIS CATHETER;  Surgeon: Waynetta Sandy, MD;  Location: [place];  Service: Vascular;  Laterality: Right;   INSERTION OF DIALYSIS CATHETER Right 02/04/2017   Procedure: INSERTION OF DIALYSIS CATHETER;  Surgeon: Waynetta Sandy, MD;  Location: [place];  Service: Vascular;  Laterality: Right;   [place] Left 10/23/2016   Procedure: Fistulagram;  Surgeon: Elam Dutch, MD;  Location: [place] [facility];  Service: Cardiovascular;  Laterality: Left;   PERIPHERAL VASCULAR INTERVENTION  03/26/2022   Procedure: PERIPHERAL VASCULAR INTERVENTION;  Surgeon: Marty Heck, MD;  Location: [place] [facility];  Service: Cardiovascular;;  left sfa   PERIPHERAL VASCULAR THROMBECTOMY Right 04/16/2020   Procedure: PERIPHERAL VASCULAR THROMBECTOMY;  Surgeon: Katha Cabal, MD;  Location: [facility];  Service: Cardiovascular;  Laterality: Right;   RESECTION OF ARTERIOVENOUS FISTULA ANEURYSM Left 11/22/2015   Procedure: RESECTION OF LEFT RADIOCEPHALIC FISTULA ANEURYSM ;  Surgeon: Serafina Mitchell, MD;  Location: [facility];  Service: Vascular;  Laterality: Left;   REVISON OF ARTERIOVENOUS FISTULA Left 12/22/2016   Procedure: REVISON OF LEFT ARTERIOVENOUS FISTULA;  Surgeon: Waynetta Sandy, MD;  Location: [place];  Service: Vascular;  Laterality: Left;   REVISON OF ARTERIOVENOUS FISTULA Left 02/04/2017   Procedure: REVISON OF LEFT UPPER ARM ARTERIOVENOUS FISTULA;  Surgeon: Waynetta Sandy, MD;  Location: [place];  Service: Vascular;  Laterality: Left;   UPPER EXTREMITY ANGIOGRAPHY Bilateral 09/19/2019   Procedure: UPPER EXTREMITY  ANGIOGRAPHY;  Surgeon: Katha Cabal, MD;  Location: [place] [facility];  Service: Cardiovascular;  Laterality: Bilateral;   Social History:  reports that she quit smoking about 30 years ago. Her smoking use included cigarettes. She has never used smokeless tobacco. She reports that she does not drink alcohol and does not use drugs.  Allergies  Allergen Reactions   Sulfa Antibiotics Other (See Comments)    Per patient, both parents allergic-so will not take   Adhesive [Tape] Itching    Family History  Problem Relation Age of Onset   Colon cancer Brother    Cancer Brother    Coronary artery disease Mother 7   Hyperlipidemia Mother    Hypertension Mother    Stroke Maternal Aunt    Esophageal cancer Neg Hx    Stomach cancer Neg Hx    Rectal cancer Neg Hx     Prior to Admission medications   Medication Sig Start Date End Date Taking? Authorizing Provider  acetaminophen (TYLENOL) 500 MG tablet Take 1 tablet (500 mg total) by  mouth every 6 (six) hours as needed. Patient taking differently: Take 500 mg by mouth every 6 (six) hours as needed for moderate pain or headache. 09/05/18   Law, Bea Graff, PA-C  albuterol (PROVENTIL) (2.5 MG/3ML) 0.083% nebulizer solution Take 3 mLs (2.5 mg total) by nebulization every 4 (four) hours as needed for wheezing. 05/04/22   Elgergawy, Silver Huguenin, MD  albuterol (VENTOLIN HFA) 108 (90 Base) MCG/ACT inhaler Inhale 1-2 puffs into the lungs every 6 (six) hours as needed for wheezing or shortness of breath. 01/11/22   Mesner, Corene Cornea, MD  atorvastatin (LIPITOR) 80 MG tablet Take 80 mg by mouth daily. 03/25/22   [provider]  B Complex-C-Folic Acid (DIALYVITE 814) 0.8 MG WAFR Take 1 tablet by mouth daily. 01/28/22   [provider]  ceFAZolin (ANCEF) 2-4 GM/100ML-% IVPB Inject 100 mLs (2 g total) into the vein every Monday, Wednesday, and Friday at 8 PM for 22 days. Patient will receive this after her HD at HD center. 05/04/22 05/26/22   Elgergawy, Silver Huguenin, MD  cephALEXin (KEFLEX) 500 MG capsule Take 1 capsule (500 mg total) by mouth every evening. Start on 05/26/2022, continue with seen by ID. 05/26/22 06/25/22  Elgergawy, Silver Huguenin, MD  clopidogrel (PLAVIX) 75 MG tablet Take 1 tablet (75 mg total) by mouth daily. 03/26/22 03/26/23  Marty Heck, MD  escitalopram (LEXAPRO) 10 MG tablet Take 10 mg by mouth daily. 03/25/22   [provider]  feeding supplement (ENSURE ENLIVE / ENSURE PLUS) LIQD Take 237 mLs by mouth 2 (two) times daily between meals. 01/24/22   Swayze, Ava, DO  folic acid-vitamin b complex-vitamin c-selenium-zinc (DIALYVITE) 3 MG TABS tablet Take 1 tablet by mouth daily.    [provider]  loperamide (IMODIUM) 2 MG capsule Take 2 mg by mouth daily as needed for diarrhea or loose stools.    [provider]  midodrine (PROAMATINE) 5 MG tablet Take 1 tablet (5 mg total) by mouth 3 (three) times daily with meals. 05/04/22   Elgergawy, Silver Huguenin, MD  ondansetron (ZOFRAN) 8 MG tablet Take 8 mg by mouth 2 (two) times daily as needed for nausea or vomiting.    [provider]  pantoprazole (PROTONIX) 40 MG tablet Take 1 tablet (40 mg total) by mouth daily. 08/18/21   Nita Sells, MD  polyethylene glycol (MIRALAX / GLYCOLAX) 17 g packet Take 17 g by mouth daily as needed for mild constipation. 05/04/22   Elgergawy, Silver Huguenin, MD  sevelamer (RENAGEL) 800 MG tablet Take 1,600 mg by mouth 3 (three) times daily with meals. 03/24/22   [provider]  zolpidem (AMBIEN) 10 MG tablet Take 10 mg by mouth at bedtime as needed for sleep. 02/10/22   [provider]    Physical Exam: Vitals:   05/06/22 1800 05/06/22 1830 05/06/22 1900 05/06/22 1930  BP: (!) 136/52 (!) 131/37 (!) 119/46 (!) 111/54  Pulse:      Resp: 15     Temp:      TempSrc:      SpO2:       General:  Appears calm and comfortable and is in NAD; frail Eyes:  PERRL, EOMI, normal lids, iris ENT:  grossly  normal hearing, lips & tongue, mmm Neck:  no LAD, masses or thyromegaly Cardiovascular:  RRR, no m/r/g. 2+ LE edema.  Respiratory:   CTA bilaterally with no wheezes/rales/rhonchi.  Normal respiratory effort. Abdomen:  soft, NT, ND Skin:  AV fistula with pressure dressing on  RUE Musculoskeletal:  grossly normal tone BUE/BLE, good ROM, no bony abnormality Psychiatric:  blunted mood and affect, speech fluent and appropriate, AOx3 Neurologic:  CN 2-12 grossly intact, moves all extremities in coordinated fashion   Radiological Exams on Admission: Independently reviewed - see discussion in A/P where applicable  DG Chest Port 1 View  Result Date: 05/06/2022 CLINICAL DATA:  Shortness of breath EXAM: PORTABLE CHEST 1 VIEW COMPARISON:  Nine days ago FINDINGS: Cardiomegaly and vascular pedicle widening with diffuse interstitial coarsening including Kerley lines. No visible effusion or pneumothorax. Artifact from EKG leads. IMPRESSION: CHF pattern. Electronically Signed   By: Jorje Guild M.D.   On: 05/06/2022 07:11    EKG: Independently reviewed.  NSR with rate 93; nonspecific ST changes with no evidence of acute ischemia   Labs on Admission: I have personally reviewed the available labs and imaging studies at the time of the admission.  Pertinent labs:    K+ 5.3 Glucose 140 BUN 36/Creatinine 4.92/GFR 9 Albumin 3.2 BNP >4500 HS troponin 272, 816 Lactate 3.5, 1.7 WBC 15.4 Hgb 9.2 Lactate 1.2 COVID/flu negative   Assessment and Plan: Principal Problem:   Hypervolemia associated with renal insufficiency Active Problems:   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HCC)   End-stage renal disease on hemodialysis (HCC)   Diarrhea   NSTEMI (non-ST elevated myocardial infarction) (HCC)   Acute on chronic diastolic (congestive) heart failure (HCC)   DNR (do not resuscitate)    Volume overload in an ESRD on HD patient -Markedly elevated BNP -Suspect symptoms are related to volume overload in the  setting of ESRD + CHF -She is likely to benefit from serial HD, may need a new EDW -Patient on chronic MWF HD -Nephrology prn order set utilized -Nephrology is aware that patient will need HD today -Continue Sevelamer, Dialyvite  NSTEMI -No report of CP -However, elevated troponin on admission with markedly positive delta -Will start heparin drip -Cardiology consulted -Volume overload may be related to renal condition and/or to ischemic cardiomyopathy -Continue Plavix  Recent AV graft infection, staph bacteremia  -Admitted to ICU with pressor support initially -She was discharged home on Ancef through 6/13 -> Keflex 500 mg daily -Antibiotic coverage was broadened to Cefepime and Vanc in the ER -ID consulted to advise about abx -Lactic acidosis has resolved, low suspicion currently for active infection  Diarrhea -Patient with vague history about diarrhea -She reports daily frequent stools but was unable to quantify or to say how long this has been going on -Given recent antibiotics, C diff was ordered  Hypotension -Continue midodrine  HLD -Continue Lipitor   Deconditioning/weakness -PT/OT consulted -Recommendation for SNF placement following last hospitalization  DNR -I have discussed code status with the patient and she would not desire resuscitation and would prefer to die a natural death should that situation arise; she says that she has recently discussed this with her sons. -She will need a gold out of facility DNR form at the time of discharge      Advance Care Planning:   Code Status: DNR   Consults: Nephrology; Cardiology; ID; PT/OT; TOC team  DVT Prophylaxis: Heparin drip  Family Communication: None present; I spoke with her sister by telephone on the evening of admission  Severity of Illness: The appropriate patient status for this patient is INPATIENT. Inpatient status is judged to be reasonable and necessary in order to provide the required intensity of  service to ensure the patient's safety. The patient's presenting symptoms, physical exam findings, and initial radiographic  and laboratory data in the context of their chronic comorbidities is felt to place them at high risk for further clinical deterioration. Furthermore, it is not anticipated that the patient will be medically stable for discharge from the hospital within 2 midnights of admission.   * I certify that at the point of admission it is my clinical judgment that the patient will require inpatient hospital care spanning beyond 2 midnights from the point of admission due to high intensity of service, high risk for further deterioration and high frequency of surveillance required.*  Author: Karmen Bongo, MD 05/06/2022 7:39 PM  For on call review [email or web address].

## 2022-05-06 NOTE — ED Notes (Signed)
Approved to go to dialysis per MD

## 2022-05-06 NOTE — Sepsis Progress Note (Signed)
Sepsis protocol monitored by eLink 

## 2022-05-07 ENCOUNTER — Other Ambulatory Visit (HOSPITAL_COMMUNITY): Payer: Self-pay

## 2022-05-07 DIAGNOSIS — Z992 Dependence on renal dialysis: Secondary | ICD-10-CM

## 2022-05-07 DIAGNOSIS — N186 End stage renal disease: Secondary | ICD-10-CM | POA: Diagnosis not present

## 2022-05-07 DIAGNOSIS — I214 Non-ST elevation (NSTEMI) myocardial infarction: Secondary | ICD-10-CM

## 2022-05-07 DIAGNOSIS — N289 Disorder of kidney and ureter, unspecified: Secondary | ICD-10-CM | POA: Diagnosis not present

## 2022-05-07 DIAGNOSIS — A0472 Enterocolitis due to Clostridium difficile, not specified as recurrent: Secondary | ICD-10-CM | POA: Diagnosis not present

## 2022-05-07 DIAGNOSIS — E8770 Fluid overload, unspecified: Secondary | ICD-10-CM | POA: Diagnosis not present

## 2022-05-07 DIAGNOSIS — D72829 Elevated white blood cell count, unspecified: Secondary | ICD-10-CM

## 2022-05-07 DIAGNOSIS — I5033 Acute on chronic diastolic (congestive) heart failure: Secondary | ICD-10-CM

## 2022-05-07 DIAGNOSIS — R5381 Other malaise: Secondary | ICD-10-CM

## 2022-05-07 LAB — CBC
HCT: 27.1 % — ABNORMAL LOW (ref 36.0–46.0)
Hemoglobin: 9.1 g/dL — ABNORMAL LOW (ref 12.0–15.0)
MCH: 33 pg (ref 26.0–34.0)
MCHC: 33.6 g/dL (ref 30.0–36.0)
MCV: 98.2 fL (ref 80.0–100.0)
Platelets: 211 10*3/uL (ref 150–400)
RBC: 2.76 MIL/uL — ABNORMAL LOW (ref 3.87–5.11)
RDW: 18.2 % — ABNORMAL HIGH (ref 11.5–15.5)
WBC: 9.1 10*3/uL (ref 4.0–10.5)
nRBC: 0 % (ref 0.0–0.2)

## 2022-05-07 LAB — BASIC METABOLIC PANEL
Anion gap: 13 (ref 5–15)
BUN: 9 mg/dL (ref 8–23)
CO2: 25 mmol/L (ref 22–32)
Calcium: 9.1 mg/dL (ref 8.9–10.3)
Chloride: 94 mmol/L — ABNORMAL LOW (ref 98–111)
Creatinine, Ser: 2.16 mg/dL — ABNORMAL HIGH (ref 0.44–1.00)
GFR, Estimated: 24 mL/min — ABNORMAL LOW (ref >60)
Glucose, Bld: 99 mg/dL (ref 70–99)
Potassium: 4.2 mmol/L (ref 3.5–5.1)
Sodium: 132 mmol/L — ABNORMAL LOW (ref 135–145)

## 2022-05-07 LAB — HEPARIN LEVEL (UNFRACTIONATED)
Heparin Unfractionated: <0.1 IU/mL — ABNORMAL LOW (ref 0.30–0.70)
Heparin Unfractionated: <0.1 IU/mL — ABNORMAL LOW (ref 0.30–0.70)

## 2022-05-07 LAB — C DIFFICILE QUICK SCREEN W PCR REFLEX
C Diff antigen: POSITIVE — AB
C Diff toxin: NEGATIVE

## 2022-05-07 LAB — CLOSTRIDIUM DIFFICILE BY PCR, REFLEXED: Toxigenic C. Difficile by PCR: POSITIVE — AB

## 2022-05-07 LAB — TROPONIN I (HIGH SENSITIVITY): Troponin I (High Sensitivity): 995 ng/L (ref <18)

## 2022-05-07 MED ORDER — FAMOTIDINE 20 MG PO TABS
20.0000 mg | ORAL_TABLET | Freq: Every day | ORAL | Status: DC
  Administered 2022-05-08 – 2022-05-22 (×15): 20 mg via ORAL
  Filled 2022-05-07 (×15): qty 1

## 2022-05-07 MED ORDER — HEPARIN BOLUS VIA INFUSION
1400.0000 [IU] | Freq: Once | INTRAVENOUS | Status: AC
  Administered 2022-05-07: 1400 [IU] via INTRAVENOUS
  Filled 2022-05-07: qty 1400

## 2022-05-07 MED ORDER — CEFAZOLIN SODIUM-DEXTROSE 1-4 GM/50ML-% IV SOLN: 1.0000 g | INTRAVENOUS | Status: DC

## 2022-05-07 MED ORDER — CEFAZOLIN SODIUM-DEXTROSE 2-4 GM/100ML-% IV SOLN
2.0000 g | INTRAVENOUS | Status: DC
  Administered 2022-05-08 – 2022-05-14 (×3): 2 g via INTRAVENOUS
  Filled 2022-05-07 (×2): qty 100

## 2022-05-07 MED ORDER — VANCOMYCIN HCL 125 MG PO CAPS
125.0000 mg | ORAL_CAPSULE | Freq: Four times a day (QID) | ORAL | Status: AC
  Administered 2022-05-07 – 2022-05-16 (×35): 125 mg via ORAL
  Filled 2022-05-07 (×45): qty 1

## 2022-05-07 MED ORDER — HEPARIN BOLUS VIA INFUSION
2850.0000 [IU] | Freq: Once | INTRAVENOUS | Status: AC
  Administered 2022-05-07: 2850 [IU] via INTRAVENOUS
  Filled 2022-05-07: qty 2850

## 2022-05-07 MED ORDER — HEPARIN (PORCINE) 25000 UT/250ML-% IV SOLN
950.0000 [IU]/h | INTRAVENOUS | Status: AC
  Administered 2022-05-07: 550 [IU]/h via INTRAVENOUS
  Administered 2022-05-08: 850 [IU]/h via INTRAVENOUS
  Filled 2022-05-07 (×2): qty 250

## 2022-05-07 MED ORDER — CHLORHEXIDINE GLUCONATE CLOTH 2 % EX PADS
6.0000 | MEDICATED_PAD | Freq: Every day | CUTANEOUS | Status: DC
  Administered 2022-05-07 – 2022-05-12 (×4): 6 via TOPICAL

## 2022-05-07 MED ORDER — VANCOMYCIN 50 MG/ML ORAL SOLUTION
125.0000 mg | Freq: Two times a day (BID) | ORAL | Status: DC
  Filled 2022-05-07: qty 2.5

## 2022-05-07 NOTE — Progress Notes (Signed)
Pt arrived to the unit per stretcher from HD unit. VS assessed. Skin checked with another RN. Pt is noted to have HD access on bilateral hands with restricted extremity arm band in place but pt is found to have a 22 G peripheral IV access on R FA upon arrival. This RN recorded the IV access to LDA but did not insert it. Pt also have a R EJ IV access. Orders obtained to draw labs from pts feet. Pt oriented on how to call for assistance, safety procedures and unit protocol. Pt verbalized understanding. Will continue to monitor pt.

## 2022-05-07 NOTE — Progress Notes (Signed)
Pt receives out-pt HD at [facility] on MWF. Pt arrives at 11:45 for 12:00 chair time. Will assist as needed.   Melven Sartorius Renal Navigator [phone]

## 2022-05-07 NOTE — Progress Notes (Addendum)
Progress Note  Patient Name: [name] Date of Encounter: 05/07/2022  [facility] Cardiologist: Lauree Chandler, MD   Subjective   Patient denies any chest pain. Reports that her breathing has been improving with dialysis. Patient usually wears a nasal cannula with 2 L oxygen at home and is back to her baseline.   Inpatient Medications    Scheduled Meds:  atorvastatin  80 mg Oral Daily   B-complex with vitamin C  1 tablet Oral Daily   And   folic acid  1 mg Oral Daily   Chlorhexidine Gluconate Cloth  6 each Topical Q0600   Chlorhexidine Gluconate Cloth  6 each Topical Q0600   [START ON 05/08/2022] cinacalcet  30 mg Oral Q M,W,F-HD   clopidogrel  75 mg Oral Daily   escitalopram  10 mg Oral Daily   [START ON 05/08/2022] famotidine  20 mg Oral Daily   feeding supplement  237 mL Oral BID BM   metoprolol tartrate  12.5 mg Oral BID   midodrine  5 mg Oral TID WC   sevelamer carbonate  1,600 mg Oral TID WC   sodium chloride flush  3 mL Intravenous Q12H   vancomycin  125 mg Oral QID   Continuous Infusions:  [START ON 05/08/2022]  ceFAZolin (ANCEF) IV     heparin 550 Units/hr (05/07/22 0022)   PRN Meds: acetaminophen **OR** acetaminophen, albuterol, calcium carbonate (dosed in mg elemental calcium), camphor-menthol **AND** hydrOXYzine, docusate sodium, feeding supplement (NEPRO CARB STEADY), hydrALAZINE, ondansetron **OR** ondansetron (ZOFRAN) IV, sorbitol, zolpidem   Vital Signs    Vitals:   05/06/22 2035 05/06/22 2200 05/07/22 0606 05/07/22 1028  BP: (!) 118/56  (!) 108/46 (!) 105/49  Pulse:   97 80  Resp:   17 17  Temp: 97.8 F (36.6 C)  97.6 F (36.4 C) 98.4 F (36.9 C)  TempSrc: Oral   Oral  SpO2:   100% 92%  Weight:  52.6 kg 53.6 kg   Height:  '5\' 2"'$  (1.575 m)      Intake/Output Summary (Last 24 hours) at 05/07/2022 1104 Last data filed at 05/07/2022 0600 Gross per 24 hour  Intake 737.51 ml  Output 2000 ml  Net -1262.49 ml      05/07/2022     6:06 AM 05/06/2022   10:00 PM  Last 3 Weights  Weight (lbs) 118 lb 2.7 oz 115 lb 15.4 oz  Weight (kg) 53.6 kg 52.6 kg      Telemetry    Sinus rhythm, sinus tachycardia. HR in the 90s-110s - Personally Reviewed  ECG    No new tracings today - Personally Reviewed  Physical Exam   GEN: Frail elderly female, in no acute distress. Resting comfortably in the bed wearing [place] with 2 L supplemental oxygen  Neck: No JVD Cardiac: RRR, tachycardic. no murmurs, rubs, or gallops.  Respiratory: Clear to auscultation bilaterally. GI: Soft, nontender, non-distended  MS: No edema; No deformity. Neuro:  Nonfocal  Psych: Normal affect   Labs    High Sensitivity Troponin:   Recent Labs  Lab 05/06/22 1000 05/06/22 1250 05/06/22 2224 05/07/22 0059  TROPONINIHS 272* 816* 1,008* 995*     Chemistry Recent Labs  Lab 05/03/22 2035 05/04/22 0443 05/06/22 0700 05/07/22 0059  NA 129* 131* 135 132*  K 4.2 4.4 5.3* 4.2  CL 93* 95* 97* 94*  CO2 '24 24 23 25  '$ GLUCOSE 117* 92 140* 99  BUN 33* 37* 36* 9  CREATININE 5.11* 5.54* 4.92* 2.16*  CALCIUM 9.7 9.9 10.6* 9.1  PROT  --   --  7.3  --   ALBUMIN 2.5*  --  3.2*  --   AST  --   --  31  --   ALT  --   --  5  --   ALKPHOS  --   --  105  --   BILITOT  --   --  1.0  --   GFRNONAA 9* 8* 9* 24*  ANIONGAP '12 12 15 13    '$ Lipids No results for input(s): CHOL, TRIG, HDL, LABVLDL, LDLCALC, CHOLHDL in the last 168 hours.  Hematology Recent Labs  Lab 05/04/22 0443 05/06/22 0700 05/07/22 0059  WBC 8.5 15.4* 9.1  RBC 2.76* 2.84* 2.76*  HGB 8.9* 9.2* 9.1*  HCT 26.3* 28.9* 27.1*  MCV 95.3 101.8* 98.2  MCH 32.2 32.4 33.0  MCHC 33.8 31.8 33.6  RDW 16.6* 17.9* 18.2*  PLT 218 217 211   Thyroid No results for input(s): TSH, FREET4 in the last 168 hours.  BNP Recent Labs  Lab 05/06/22 1000  BNP >4,500.0*    DDimer No results for input(s): DDIMER in the last 168 hours.   Radiology    DG Chest Port 1 View  Result Date: 05/06/2022 CLINICAL  DATA:  Shortness of breath EXAM: PORTABLE CHEST 1 VIEW COMPARISON:  Nine days ago FINDINGS: Cardiomegaly and vascular pedicle widening with diffuse interstitial coarsening including Kerley lines. No visible effusion or pneumothorax. Artifact from EKG leads. IMPRESSION: CHF pattern. Electronically Signed   By: Jorje Guild M.D.   On: 05/06/2022 07:11    Cardiac Studies   Echocardiogram 04/28/2022   1. No left ventricular thrombus is seen. Left ventricular ejection  fraction, by estimation, is 50 to 55%. The left ventricle has low normal  function. The left ventricle demonstrates regional wall motion  abnormalities (see scoring diagram/findings for  description). There is moderate hypokinesis of the left ventricular,  entire inferolateral wall.   2. The right ventricular size is moderately enlarged. There is severely  elevated pulmonary artery systolic pressure.   3. Left atrial size was moderately dilated.   4. Right atrial size was moderately dilated.   5. Mild to moderate mitral valve regurgitation. No evidence of mitral  stenosis.   6. Tricuspid valve regurgitation is moderate to severe.   7. The aortic valve is tricuspid. There is moderate calcification of the  aortic valve. There is moderate thickening of the aortic valve. Aortic  valve regurgitation is trivial. Aortic valve sclerosis/calcification is  present, without any evidence of  aortic stenosis.   8. The inferior vena cava is dilated in size with <50% respiratory  variability, suggesting right atrial pressure of 15 mmHg.   Conclusion(s)/Recommendation(s): No evidence of valvular vegetations on  this transthoracic echocardiogram. Consider a transesophageal  echocardiogram to exclude infective endocarditis if clinically indicated.   Patient Profile     71 y.o. female with a history of ESRD on HD, CHF, CAD (on CT chest, no prior heart catheterizations), HLD, HTN, recent AV fistula infection who was seen on 5/24 for the  evaluation of CHF and elevated troponin   Assessment & Plan    Acute on Chronic diastolic heart failure  Volume overload in an ESRD on HD patient  - Most recent echocardiogram from 04/28/22 showed EF 50-55%, severely elevated pulmonary artery systolic pressure, moderately dilated LA/RA - Patient presented complaining of increased oxygen requirements, chest pain  - BNP elevated to >4500 (patient has had multiple  BNP's >4500 in the past year)  - CXR showed a CHF pattern-- cardiomegaly and vasxcular pedicle widening with diffuse interstitial coarsening including Kerley lines  - Volume will be managed with HD-- patient was seen by cardiology during dialysis yesterday and she reported her symptoms were improving  - Continue metoprolol 12.5 mg BID-- this will also help control patient's HR as she was mildly tachycardic overnight    NSTEMI  CAD  HLD with LDL goal <70  - In addition to SOB, patient also complained of chest pain-- described as pressure in the middle of her chest, associated with nausea and diaphoresis. Was chest pain free during dialysis  - hsTn 272>>816>>1008>>995 - Historically, patient has been opposed to cardiac catheterization. Although she is now agreeable, patient is not a good cath candidate due to recent infection, c.diff, DNR status   - Started on IV heparin, continue for a full 48 hours  - Continue statin, metoprolol   ESRD on HD  - Patient receives HD MWF  - Managed per primary/nephrology  - On midodrine for hypotension-- BP is stable   Otherwise, per primary - History of TIA  - Recent AV graft infection, staph bacteremia  - C. Diff, diarrhea   - Deconditioning, weakness  - DNR       For questions or updates, please contact [place] Please consult [email or web address] for contact info under        Signed, Candee Furbish, MD  05/07/2022, 11:04 AM    Personally seen and examined. Agree with above.  Feels better after dialysis yesterday.  Volume improved.   Symptoms improved.  Troponin peaked at 1000.  We will continue with IV heparin for a full 48-hour duration.  Continue with statin and metoprolol.  I do not think that she is a good cardiac catheterization candidate given numerous factors including recent fistula infection, C. difficile, DNR status.  Thankfully, symptoms are improving. Elevated troponin does portend worsened prognosis overall.  Candee Furbish, MD

## 2022-05-07 NOTE — Progress Notes (Signed)
OT Cancellation Note  Patient Details Name: EARNESTINE SHIPP MRN: [id number] DOB: 08-10-1951   Cancelled Treatment:    Reason Eval/Treat Not Completed: Patient's level of consciousness.  Unable to maintain LOA to participate with eval this date.  Continue efforts as appropriate.    Lamar Meter D Shellie Rogoff 05/07/2022, 2:00 PM 05/07/2022  RP, OTR/L  Acute Rehabilitation Services  Office:  [phone]

## 2022-05-07 NOTE — Consult Note (Signed)
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 Admitted with [facility] and diarrhea.  #Hx of MSSA bacteremia #RAVG revision on 5/15 # C diff diarrhea #Leukocytosis -Using RAVG for IHD. LUE acess is non functional. Currently on vancomycin  and cefepime. -Cdiff returned  + Ag and PCR. Would attribute leukocytosis to cdiff infection->start PO vancomycin.  Recommendations:  -D/C cefepime and vancomycin IV -Re-start cefazolin(EOT 6/13) -Start Vancomycin '125mg'$  PO qid x 10 days then '125mg'$  PO bid until 7 days following completion of IV antibiotics -Pt will need suppressive therapy as she has new graft placement at infected RUE site (doxycyline '100mg'$  PO bid, since she had c diff of cefazolin) -Follow blood Cx -Appt with Dr. Gale Journey on 6/6 Microbiology:   Antibiotics: Cefazolin 5/16-6/13 Vancomycin and cefepime 5/23-5/24 Cultures: Blood  Cx from Prior hospitalization: 5/16 repeat bcx negative 5/15 graft/tissue cx staph aureus 5/14 bcx mssa   HPI: [name] is a 71 y.o. female with Hcx of CVA, HF, CAD, HTN, HLD,  PVD, ESRD on iHD wit RUE AVF placed 03/2020 c/b MSSA bacteremia 2/2 RUE graft infection SP graft removal(5/15) and new graft placement at infected site planned on treatment with cefazolin x 4 weeks and  oral suppression with keflex. She was admitted with NSTEMI, volume overload. She had been having increase SHOB and diarrhea. On arrival O2 in low 80s, wbc 15K. Cdiff returned positive for antigen and PCR.    Review of Systems: ROS  Past Medical History:  Diagnosis Date   Acute ischemic stroke ([place]) 02/23/2017   Acute on chronic diastolic CHF (congestive heart failure) ([place])    Adenomatous polyp of colon 10/2010, 2006, 2015   Anemia in CKD (chronic kidney disease) 11/07/2012   s/p blood transfusion.    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  Had some early rejection then stable function for years, then had slow decline of function and went back on hemodialysis in 2012.  Gets HD TTS schedule at [facility] still using L forearm AVF.      GERD (gastroesophageal reflux disease)    GI bleed 2017   felt to be ischemic colitis, last colo 2015   Hyperlipidemia    Hypertension    Neurologic gait dysfunction    Neuromuscular disorder (HCC)    neuropathy hand and legs   NSTEMI (non-ST elevated myocardial infarction) ([place]) 05/14/2020  Osteoporosis    Pneumonia    Pseudoaneurysm of surgical AV fistula (HCC)    left upper arm    Social History   Tobacco Use   Smoking status: Former    Types: Cigarettes    Quit date: 12/31/1991    Years since quitting: 30.3   Smokeless tobacco: Never  Vaping Use   Vaping Use: Never used  Substance Use Topics   Alcohol use: No    Alcohol/week: 0.0 standard drinks   Drug use: No    Family History  Problem Relation Age of Onset   Colon cancer Brother    Cancer Brother    Coronary artery disease Mother 82    Hyperlipidemia Mother    Hypertension Mother    Stroke Maternal Aunt    Esophageal cancer Neg Hx    Stomach cancer Neg Hx    Rectal cancer Neg Hx    Scheduled Meds:  atorvastatin  80 mg Oral Daily   B-complex with vitamin C  1 tablet Oral Daily   And   folic acid  1 mg Oral Daily   Chlorhexidine Gluconate Cloth  6 each Topical Q0600   Chlorhexidine Gluconate Cloth  6 each Topical Q0600   [START ON 05/08/2022] cinacalcet  30 mg Oral Q M,W,F-HD   clopidogrel  75 mg Oral Daily   escitalopram  10 mg Oral Daily   [START ON 05/08/2022] famotidine  20 mg Oral Daily   feeding supplement  237 mL Oral BID BM   heparin  1,400 Units Intravenous Once   metoprolol tartrate  12.5 mg Oral BID   midodrine  5 mg Oral TID WC   sevelamer carbonate  1,600 mg Oral TID WC   sodium chloride flush  3 mL Intravenous Q12H   vancomycin  125 mg Oral QID   Continuous Infusions:  [START ON 05/08/2022]  ceFAZolin (ANCEF) IV     heparin 550 Units/hr (05/07/22 0022)   PRN Meds:.acetaminophen **OR** acetaminophen, albuterol, calcium carbonate (dosed in mg elemental calcium), camphor-menthol **AND** hydrOXYzine, docusate sodium, feeding supplement (NEPRO CARB STEADY), hydrALAZINE, ondansetron **OR** ondansetron (ZOFRAN) IV, sorbitol, zolpidem Allergies  Allergen Reactions   Sulfa Antibiotics Other (See Comments)    Per patient, both parents allergic-so will not take   Adhesive [Tape] Itching    OBJECTIVE: Blood pressure (!) 105/49, pulse 80, temperature 98.4 F (36.9 C), temperature source Oral, resp. rate 17, height '5\' 2"'$  (1.575 m), weight 53.6 kg, SpO2 92 %.  Physical Exam Constitutional:      Appearance: Normal appearance.  HENT:     Head: Normocephalic and atraumatic.     Right Ear: Tympanic membrane normal.     Left Ear: Tympanic membrane normal.     Nose: Nose normal.     Mouth/Throat:     Mouth: Mucous membranes are moist.  Eyes:     Extraocular Movements: Extraocular movements intact.      Conjunctiva/sclera: Conjunctivae normal.     Pupils: Pupils are equal, round, and reactive to light.  Cardiovascular:     Rate and Rhythm: Normal rate and regular rhythm.     Heart sounds: No murmur heard.   No friction rub. No gallop.  Pulmonary:     Effort: Pulmonary effort is normal.     Breath sounds: Normal breath sounds.  Abdominal:     General: Abdomen is flat.     Palpations: Abdomen is soft.  Musculoskeletal:        General: Normal range of motion.  Comments: Left UE fistula RUE graft-non tender to palpation  Skin:    General: Skin is warm and dry.  Neurological:     General: No focal deficit present.     Mental Status: She is alert and oriented to person, place, and time.  Psychiatric:        Mood and Affect: Mood normal.    Lab Results Lab Results  Component Value Date   WBC 9.1 05/07/2022   HGB 9.1 (L) 05/07/2022   HCT 27.1 (L) 05/07/2022   MCV 98.2 05/07/2022   PLT 211 05/07/2022    Lab Results  Component Value Date   CREATININE 2.16 (H) 05/07/2022   BUN 9 05/07/2022   NA 132 (L) 05/07/2022   K 4.2 05/07/2022   CL 94 (L) 05/07/2022   CO2 25 05/07/2022    Lab Results  Component Value Date   ALT 5 05/06/2022   AST 31 05/06/2022   ALKPHOS 105 05/06/2022   BILITOT 1.0 05/06/2022       Laurice Record, [place] for Infectious Disease [facility] 05/07/2022, 11:25 AM

## 2022-05-07 NOTE — Assessment & Plan Note (Addendum)
Deemed to be not Geron Mulford good interventional candidate by cardiology Cardiology recommended medical management and palliative care consult. Patient is DNR/DNI.  May benefit from palliative follow-up outpatient She is currently chest pain-free.

## 2022-05-07 NOTE — Assessment & Plan Note (Addendum)
Diarrhea seems to have improved Continue oral vancomycin 125 mg 4 times daily for a total of 14 days from 05/06/2022 followed by twice daily through 06/02/2022.

## 2022-05-07 NOTE — Progress Notes (Signed)
ANTICOAGULATION CONSULT NOTE  Pharmacy Consult:  Heparin Indication: chest pain/ACS  Allergies  Allergen Reactions   Sulfa Antibiotics Other (See Comments)    Per patient, both parents allergic-so will not take   Adhesive [Tape] Itching    Patient Measurements: Height: '5\' 2"'$  (157.5 cm) Weight: 53.6 kg (118 lb 2.7 oz) IBW/kg (Calculated) : 50.1 Heparin Dosing Weight: 48 kg  Vital Signs: Temp: 98.4 F (36.9 C) (05/25 1028) Temp Source: Oral (05/25 1028) BP: 105/49 (05/25 1028) Pulse Rate: 80 (05/25 1028)  Labs: Recent Labs    05/06/22 0700 05/06/22 1000 05/06/22 1000 05/06/22 1250 05/06/22 2224 05/07/22 0059 05/07/22 0933  HGB 9.2*  --   --   --   --  9.1*  --   HCT 28.9*  --   --   --   --  27.1*  --   PLT 217  --   --   --   --  211  --   LABPROT  --  14.9  --   --   --   --   --   INR  --  1.2  --   --   --   --   --   HEPARINUNFRC  --   --   --   --  QUESTIONABLE RESULTS, RECOMMEND RECOLLECT TO VERIFY  --  <0.10*  CREATININE 4.92*  --   --   --   --  2.16*  --   TROPONINIHS  --  272*   < > 816* 1,008* 995*  --    < > = values in this interval not displayed.    Estimated Creatinine Clearance: 18.9 mL/min (A) (by C-G formula based on SCr of 2.16 mg/dL (H)).    Assessment: 71 yo female here after recent admit for AV graft infection. Patient is HD MWF as baseline. Pharmacy consulted to dose heparin for possible ACS.  Heparin level is undetectable.  No interruption or issue with heparin infusion since it was started per discussion with RN.  No bleeding reported.  Goal of Therapy:  Heparin level 0.3-0.7 units/ml Monitor platelets by anticoagulation protocol: Yes   Plan:  Heparin 1400 units bolus, then Increase heparin infusion to 700 units/hr Check 8 hr heparin level Daily heparin level and CBC  [name] D. Mina Marble, PharmD, BCPS, [place] 05/07/2022, 11:08 AM

## 2022-05-07 NOTE — Progress Notes (Signed)
ANTICOAGULATION CONSULT NOTE  Pharmacy Consult:  Heparin Indication: chest pain/ACS  Allergies  Allergen Reactions   Sulfa Antibiotics Other (See Comments)    Per patient, both parents allergic-so will not take   Adhesive [Tape] Itching    Patient Measurements: Height: '5\' 2"'$  (157.5 cm) Weight: 53.6 kg (118 lb 2.7 oz) IBW/kg (Calculated) : 50.1 Heparin Dosing Weight: 48 kg  Vital Signs: Temp: 97.6 F (36.4 C) (05/25 2126) Temp Source: Oral (05/25 1618) BP: 89/73 (05/25 2126) Pulse Rate: 77 (05/25 2126)  Labs: Recent Labs    05/06/22 0700 05/06/22 1000 05/06/22 1000 05/06/22 1250 05/06/22 2224 05/07/22 0059 05/07/22 0933 05/07/22 2004  HGB 9.2*  --   --   --   --  9.1*  --   --   HCT 28.9*  --   --   --   --  27.1*  --   --   PLT 217  --   --   --   --  211  --   --   LABPROT  --  14.9  --   --   --   --   --   --   INR  --  1.2  --   --   --   --   --   --   HEPARINUNFRC  --   --   --   --  QUESTIONABLE RESULTS, RECOMMEND RECOLLECT TO VERIFY  --  <0.10* <0.10*  CREATININE 4.92*  --   --   --   --  2.16*  --   --   TROPONINIHS  --  272*   < > 816* 1,008* 995*  --   --    < > = values in this interval not displayed.     Estimated Creatinine Clearance: 18.9 mL/min (A) (by C-G formula based on SCr of 2.16 mg/dL (H)).    Assessment: 71 yo female here after recent admit for AV graft infection. Patient is HD MWF as baseline. Pharmacy consulted to dose heparin for possible ACS.  Heparin level is undetectable.  No interruption or issue with heparin infusion since it was started per discussion with RN.  No bleeding reported.  Goal of Therapy:  Heparin level 0.3-0.7 units/ml Monitor platelets by anticoagulation protocol: Yes   Plan:  Heparin 1400 units bolus, then Increase heparin infusion to 850 units/hr Check 8 hr heparin level Daily heparin level and CBC  Thank you for allowing pharmacy to be a part of this patient's care.  Donnald Garre, PharmD Clinical  Pharmacist  Please check AMION for all [facility] numbers After 10:00 PM, call [place] [phone]

## 2022-05-07 NOTE — Assessment & Plan Note (Addendum)
Plan for Ancef post HD MWF until 6/13, then oral doxy 100 mg BID indefinitely  S/p exploration of L arm graft and evacuation of hematoma on 6/2 for wound dehiscence  Per vascular surgery, bacitracin and Band-Aid, and outpatient follow-up.   Vascular surgery to arrange outpatient follow-up

## 2022-05-07 NOTE — Assessment & Plan Note (Addendum)
Continue therapy

## 2022-05-07 NOTE — Progress Notes (Addendum)
[facility] Progress Note   Subjective:   Patient seen and examined at bedside.  Breathing better today.  States she is hungry, has not had breakfast yet.  Denies CP, SOB, abdominal pain and v/d.  Admits to nausea which she says is due to taking all her medication without breakfast.   Objective Vitals:   05/06/22 2030 05/06/22 2035 05/06/22 2200 05/07/22 0606  BP: (!) 120/58 (!) 118/56  (!) 108/46  Pulse:    97  Resp:    17  Temp:  97.8 F (36.6 C)  97.6 F (36.4 C)  TempSrc:  Oral    SpO2:    100%  Weight:   52.6 kg 53.6 kg  Height:   '5\' 2"'$  (1.575 m)    Physical Exam General:chronically ill appearing, frail female in NAD Heart:RRR, no mrg Lungs:CTAB, nml WOB on RA (Alexander is under her chin) Abdomen:soft, NTND Extremities:no LE edema Dialysis Access: RU AVG +b/t   Filed Weights   05/06/22 2200 05/07/22 0606  Weight: 52.6 kg 53.6 kg    Intake/Output Summary (Last 24 hours) at 05/07/2022 0931 Last data filed at 05/07/2022 0600 Gross per 24 hour  Intake 737.51 ml  Output 2000 ml  Net -1262.49 ml    Additional Objective Labs: Basic Metabolic Panel: Recent Labs  Lab 05/03/22 2035 05/04/22 0443 05/06/22 0700 05/07/22 0059  NA 129* 131* 135 132*  K 4.2 4.4 5.3* 4.2  CL 93* 95* 97* 94*  CO2 '24 24 23 25  '$ GLUCOSE 117* 92 140* 99  BUN 33* 37* 36* 9  CREATININE 5.11* 5.54* 4.92* 2.16*  CALCIUM 9.7 9.9 10.6* 9.1  PHOS 3.0  --   --   --    Liver Function Tests: Recent Labs  Lab 05/03/22 2035 05/06/22 0700  AST  --  31  ALT  --  5  ALKPHOS  --  105  BILITOT  --  1.0  PROT  --  7.3  ALBUMIN 2.5* 3.2*    CBC: Recent Labs  Lab 05/02/22 0408 05/03/22 2035 05/04/22 0443 05/06/22 0700 05/07/22 0059  WBC 7.3 8.5 8.5 15.4* 9.1  NEUTROABS  --   --   --  12.6*  --   HGB 8.3* 8.8* 8.9* 9.2* 9.1*  HCT 25.8* 25.9* 26.3* 28.9* 27.1*  MCV 97.0 94.9 95.3 101.8* 98.2  PLT 181 218 218 217 211   Blood Culture    Component Value Date/Time   SDES BLOOD  SITE NOT SPECIFIED 05/06/2022 1005   SPECREQUEST  05/06/2022 1005    BOTTLES DRAWN AEROBIC AND ANAEROBIC Blood Culture adequate volume   CULT  05/06/2022 1005    NO GROWTH < 12 HOURS Performed at [facility], [address] [place], [place] [address]    REPTSTATUS PENDING 05/06/2022 1005    Studies/Results: DG Chest Port 1 View  Result Date: 05/06/2022 CLINICAL DATA:  Shortness of breath EXAM: PORTABLE CHEST 1 VIEW COMPARISON:  Nine days ago FINDINGS: Cardiomegaly and vascular pedicle widening with diffuse interstitial coarsening including Kerley lines. No visible effusion or pneumothorax. Artifact from EKG leads. IMPRESSION: CHF pattern. Electronically Signed   By: Jorje Guild M.D.   On: 05/06/2022 07:11    Medications:  ceFEPime (MAXIPIME) IV 2 g (05/06/22 2253)   heparin 550 Units/hr (05/07/22 0022)   vancomycin Stopped (05/06/22 2033)    atorvastatin  80 mg Oral Daily   B-complex with vitamin C  1 tablet Oral Daily   And   folic  acid  1 mg Oral Daily   Chlorhexidine Gluconate Cloth  6 each Topical Q0600   [START ON 05/08/2022] cinacalcet  30 mg Oral Q M,W,F-HD   clopidogrel  75 mg Oral Daily   escitalopram  10 mg Oral Daily   feeding supplement  237 mL Oral BID BM   metoprolol tartrate  12.5 mg Oral BID   midodrine  5 mg Oral TID WC   pantoprazole  40 mg Oral Daily   sevelamer carbonate  1,600 mg Oral TID WC   sodium chloride flush  3 mL Intravenous Q12H    Dialysis Orders: [facility]  4h  350/600  45kg   2/2 bath  P2  AVG RUA  Hep none - last HD 5/12, 48.5 > 46.2kg - last Hb 10.8 on 5/08 - mircera 50 q4 - hectorol 2 ug IV tiw - sensipar 30 tiw po    Assessment/Plan: Acute hypoxic respiratory failure: CXR with likely pulmonary edema. HD yesterday with net UF 2L.  Breathing improved today. Currently on RA.  Leukocytosis/lactic acidosis: Improved. BC with NGTD. C Diff + - per PMD NSTEMI - troponin elevated. cardiology following. No CP this AM.  Recent  MSSA sepsis/ AVG infection -plan was to continue IV cefazolin until 6/13 with plans for oral cephalosporin thereafter for suppression ESRD - on HD MWF.  AVG required resection during last admit but still functional.  Plan for dialysis tomorrow per regular schedule. BP/ volume - has had problems with low blood pressures using midodrine.  Does not appear volume overloaded but if weights correct not close to EDW.  Max UF with HD tomorrow.  Anemia esrd -hemoglobin 9.1.  Has received ESA recently.  Consider further ESA based on progress BMD ckd - CCa and phos in range. Hypercalcemia on admit, will use low Ca bath. Hold hectorol. Continue home sensipar Nutrition - renal diet w/fluid restrictions. Alb 3.2 - continue supplements.  Jen Mow, PA-C [facility] 05/07/2022,9:31 AM  LOS: 1 day

## 2022-05-07 NOTE — Progress Notes (Signed)
Progress Note    [name]   CHE:[id number]  DOB: December 02, 1951  DOA: 05/06/2022     1 PCP: Kathryn Carol, MD  Initial CC: SOB, CP  Hospital Course: [name] is a 71 yo female with PMH dCHF, ESRD on HD, CVA, CAD, HLD, HTN, GERD who presented with shortness of breath.  She was recently hospitalized from 04/26/2022 until 05/04/2022 with septic shock due to staph bacteremia from infected AV graft.  She was discharged on Ancef to complete course followed by chronic oral suppression. On admission she was found to be somnolent, hypoxic, and volume overloaded. Further work-up revealed elevated troponin which further uptrended consistent with NSTEMI.  She was evaluated by cardiology and also started on a heparin drip on admission.  She was not considered a good interventional candidate.  Interval History:  Sleeping when seen but arouses easily; answers questions and follows commands then goes back to sleep.  Denied any chest pain.  Breathing appears improved and comfortable today.  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 Plan for Ancef post HD MWF until 6/13 then oral doxy 100 mg BID (due to CDI while on Ancef) - continue Ancef  - appreciate ID evaluation after re-admission  -Follow-up repeat blood cultures obtained on 05/06/2022  NSTEMI (non-ST elevated myocardial infarction) ([place]) - SOB and CP on admission; trop elevated with peak at 1,008 - continue heparin drip for 48 hrs per cardiology rec's - continue statin and BB - per cardiology, not currently an interventional candidate due to multiple medical issues currently   C. difficile diarrhea - s/p recent diarrhea; C diff Ag positive, toxin negative, PCR positive - started on oral Vanc - per ID continue oral vancomycin 125 mg QID x 10 days then 125 mg BID for 7 days after completion of IV abx  End-stage renal disease on hemodialysis  (HCC) - Continue chronic midodrine.  BP stable - Nephrology following, continue inpatient dialysis per nephrology  Physical deconditioning - Follow-up PT evaluation - Recommended for SNF last hospitalization  Acute on chronic diastolic (congestive) heart failure ([place]) - Volume removal planned with HD - CXR on admission consistent with volume overload   Old records reviewed in assessment of this patient  Antimicrobials: Ancef >> current  Oral vancomycin 05/07/2022 >> current  DVT prophylaxis:  Heparin drip    Code Status:   Code Status: DNR  Disposition Plan:  SNF Status is: Inpt  Objective: Blood pressure (!) 105/49, pulse 80, temperature 98.4 F (36.9 C), temperature source Oral, resp. rate 17, height '5\' 2"'$  (1.575 m), weight 53.6 kg, SpO2 92 %.  Examination:  Physical Exam Constitutional:      General: She is not in acute distress. HENT:     Head: Normocephalic and atraumatic.     Mouth/Throat:     Mouth: Mucous membranes are moist.  Eyes:     Extraocular Movements: Extraocular movements intact.  Cardiovascular:     Rate and Rhythm: Normal rate and regular rhythm.  Pulmonary:     Effort: Pulmonary effort is normal.     Breath sounds: Normal breath sounds.  Abdominal:     General: Bowel sounds are normal. There is no distension.     Palpations: Abdomen is soft.     Tenderness: There is no abdominal tenderness.  Musculoskeletal:        General: Normal range of motion.     Cervical back: Normal range of  motion and neck supple.  Skin:    General: Skin is warm and dry.  Neurological:     General: No focal deficit present.     Mental Status: She is alert.  Psychiatric:        Mood and Affect: Mood normal.     Consultants:  ID Nephrology Cardiology  Procedures:    Data Reviewed: Results for orders placed or performed during the hospital encounter of 05/06/22 (from the past 24 hour(s))  Heparin level (unfractionated)     Status: None   Collection Time:  05/06/22 10:24 PM  Result Value Ref Range   Heparin Unfractionated  0.30 - 0.70 IU/mL    QUESTIONABLE RESULTS, RECOMMEND RECOLLECT TO VERIFY  Troponin I (High Sensitivity)     Status: Abnormal   Collection Time: 05/06/22 10:24 PM  Result Value Ref Range   Troponin I (High Sensitivity) 1,008 (HH) <18 ng/L  C Difficile Quick Screen w PCR reflex     Status: Abnormal   Collection Time: 05/07/22 12:58 AM   Specimen: STOOL  Result Value Ref Range   C Diff antigen POSITIVE (A) NEGATIVE   C Diff toxin NEGATIVE NEGATIVE   C Diff interpretation Results are indeterminate. See PCR results.   C. Diff by PCR, Reflexed     Status: Abnormal   Collection Time: 05/07/22 12:58 AM  Result Value Ref Range   Toxigenic C. Difficile by PCR POSITIVE (A) NEGATIVE  Basic metabolic panel     Status: Abnormal   Collection Time: 05/07/22 12:59 AM  Result Value Ref Range   Sodium 132 (L) 135 - 145 mmol/L   Potassium 4.2 3.5 - 5.1 mmol/L   Chloride 94 (L) 98 - 111 mmol/L   CO2 25 22 - 32 mmol/L   Glucose, Bld 99 70 - 99 mg/dL   BUN 9 8 - 23 mg/dL   Creatinine, Ser 2.16 (H) 0.44 - 1.00 mg/dL   Calcium 9.1 8.9 - 10.3 mg/dL   GFR, Estimated 24 (L) >60 mL/min   Anion gap 13 5 - 15  Troponin I (High Sensitivity)     Status: Abnormal   Collection Time: 05/07/22 12:59 AM  Result Value Ref Range   Troponin I (High Sensitivity) 995 (HH) <18 ng/L  CBC     Status: Abnormal   Collection Time: 05/07/22 12:59 AM  Result Value Ref Range   WBC 9.1 4.0 - 10.5 K/uL   RBC 2.76 (L) 3.87 - 5.11 MIL/uL   Hemoglobin 9.1 (L) 12.0 - 15.0 g/dL   HCT 27.1 (L) 36.0 - 46.0 %   MCV 98.2 80.0 - 100.0 fL   MCH 33.0 26.0 - 34.0 pg   MCHC 33.6 30.0 - 36.0 g/dL   RDW 18.2 (H) 11.5 - 15.5 %   Platelets 211 150 - 400 K/uL   nRBC 0.0 0.0 - 0.2 %  Heparin level (unfractionated)     Status: Abnormal   Collection Time: 05/07/22  9:33 AM  Result Value Ref Range   Heparin Unfractionated <0.10 (L) 0.30 - 0.70 IU/mL    I have Reviewed  nursing notes, Vitals, and Lab results since pt's last encounter. Pertinent lab results : see above I have ordered test including BMP, CBC, Mg I have reviewed the last note from staff over past 24 hours I have discussed pt's care plan and test results with nursing staff, case manager   LOS: 1 day   Dwyane Dee, MD [facility] 05/07/2022, 1:14 PM

## 2022-05-07 NOTE — Progress Notes (Signed)
ID Pharmacy Note   Spoke with Dr. Leanora Cover. Patient copay for course of PO Vancomycin is $222.00. Instead at discharge, patient can be switched to Dificid 200 mg BID X 5 days then 200 mg every other day X 20 days. We will apply for patient assistance through [employer] and patient should have this medication mailed to her house for free.   Plan At discharge, Dificid 200 mg BID X 5 days then every other day for 20 days Medication will be mailed to patient home   Jimmy Footman, PharmD, [place], [place] [facility] Clinical Pharmacist Phone: [phone] 05/07/2022 1:54 PM

## 2022-05-07 NOTE — Hospital Course (Addendum)
Ms. Maiolo is a 71 yo female with PMH dCHF, ESRD on HD, CVA, CAD, HLD, HTN, GERD  and recent hospitalization from 5/14-5/22 with septic shock due to staph bacteremia from infected AV graft for which she was discharged on IV Ancef returning with shortness of breath and admitted with non-STEMI and fluid overload.  She was started on IV heparin.  Cardiology consulted.  She was deemed to be not a good interventional candidate.  Cardiology recommended medical management and palliative care consult.   Hospital stay stay complicated by wound dehiscence adjacent to L arm graft, now s/p exploration of L arm graft and evacuation of hematoma by vascular on 6/2.  She was on cefepime and vancomycin for MSSA bacteremia and left arm graft wound.  However, she developed C. Difficile, and antibiotics de-escalated to Ancef.  She was started on oral vancomycin for C. difficile on 05/06/2022.  ID recommended oral vancomycin for a total of 14 days followed by twice daily for 7 days after completion of IV Ancef which would be on 05/26/2022.  She is discharged on IV Ancef with dialysis through 05/26/2022.  She will be on p.o. doxycycline 100 mg twice daily after completing IV Ancef for suppressive therapy.  She is more or less stabilized for discharge to SNF as of 05/20/2022.

## 2022-05-07 NOTE — Assessment & Plan Note (Addendum)
Per nephrology 

## 2022-05-07 NOTE — Progress Notes (Signed)
ANTICOAGULATION CONSULT NOTE - Follow Up Consult  Pharmacy Consult for heparin Indication:  NSTEMI  Labs: Recent Labs    05/04/22 0443 05/06/22 0700 05/06/22 1000 05/06/22 1250 05/06/22 2224  HGB 8.9* 9.2*  --   --   --   HCT 26.3* 28.9*  --   --   --   PLT 218 217  --   --   --   LABPROT  --   --  14.9  --   --   INR  --   --  1.2  --   --   HEPARINUNFRC  --   --   --   --  QUESTIONABLE RESULTS, RECOMMEND RECOLLECT TO VERIFY  CREATININE 5.54* 4.92*  --   --   --   TROPONINIHS  --   --  272* 816* 1,008*    Assessment/Plan:  71yo female was to start medical management of NSTEMI with plan for heparin x48h >> went to HD prior to UFH starting and not started in HD.  Wynona Neat, PharmD, BCPS  05/07/2022,12:14 AM

## 2022-05-07 NOTE — Assessment & Plan Note (Addendum)
No cardiopulmonary symptoms this morning.  Fluid management with dialysis

## 2022-05-07 NOTE — TOC Benefit Eligibility Note (Addendum)
Patient Teacher, English as a foreign language completed.    The patient is currently admitted and upon discharge could be taking Vancomycin 250 mg capsules.  The current 24 day co-pay is, $222.00 due to a $150.00 deductible.   The patient is currently admitted and upon discharge could be taking Dificid 200 mg tablets.  The current 20 day co-pay is, $1,511.36   The patient is insured through [place], [place] Patient Advocate Specialist [place] Patient Advocate Team Direct Number: [phone]  Fax: [phone]

## 2022-05-07 NOTE — Progress Notes (Signed)
PT Cancellation Note  Patient Details Name: [name] MRN: [id number] DOB: 05/16/1951   Cancelled Treatment:     Attempted x 2 to see pt for PT evaluation. Pt lethargic and did not open eyes. Pt unable to maintain conversation with therapist to provide any subjective history other than stating "lots of bowel movements" and proceeded to fall back asleep despite max verbal and tactile stimuli. Will attempt again as able.     [place] PT, DPT  05/07/2022, 1:32 PM

## 2022-05-08 DIAGNOSIS — A0472 Enterocolitis due to Clostridium difficile, not specified as recurrent: Secondary | ICD-10-CM | POA: Diagnosis not present

## 2022-05-08 DIAGNOSIS — I214 Non-ST elevation (NSTEMI) myocardial infarction: Secondary | ICD-10-CM | POA: Diagnosis not present

## 2022-05-08 LAB — BASIC METABOLIC PANEL
Anion gap: 13 (ref 5–15)
BUN: 21 mg/dL (ref 8–23)
CO2: 23 mmol/L (ref 22–32)
Calcium: 10 mg/dL (ref 8.9–10.3)
Chloride: 95 mmol/L — ABNORMAL LOW (ref 98–111)
Creatinine, Ser: 3.81 mg/dL — ABNORMAL HIGH (ref 0.44–1.00)
GFR, Estimated: 12 mL/min — ABNORMAL LOW (ref >60)
Glucose, Bld: 81 mg/dL (ref 70–99)
Potassium: 4.1 mmol/L (ref 3.5–5.1)
Sodium: 131 mmol/L — ABNORMAL LOW (ref 135–145)

## 2022-05-08 LAB — MAGNESIUM: Magnesium: 2.3 mg/dL (ref 1.7–2.4)

## 2022-05-08 LAB — CBC WITH DIFFERENTIAL/PLATELET
Abs Immature Granulocytes: 0.05 10*3/uL (ref 0.00–0.07)
Basophils Absolute: 0.1 10*3/uL (ref 0.0–0.1)
Basophils Relative: 1 %
Eosinophils Absolute: 0.5 10*3/uL (ref 0.0–0.5)
Eosinophils Relative: 6 %
HCT: 27.7 % — ABNORMAL LOW (ref 36.0–46.0)
Hemoglobin: 9.2 g/dL — ABNORMAL LOW (ref 12.0–15.0)
Immature Granulocytes: 1 %
Lymphocytes Relative: 28 %
Lymphs Abs: 2.1 10*3/uL (ref 0.7–4.0)
MCH: 32.6 pg (ref 26.0–34.0)
MCHC: 33.2 g/dL (ref 30.0–36.0)
MCV: 98.2 fL (ref 80.0–100.0)
Monocytes Absolute: 1.6 10*3/uL — ABNORMAL HIGH (ref 0.1–1.0)
Monocytes Relative: 21 %
Neutro Abs: 3.3 10*3/uL (ref 1.7–7.7)
Neutrophils Relative %: 43 %
Platelets: 216 10*3/uL (ref 150–400)
RBC: 2.82 MIL/uL — ABNORMAL LOW (ref 3.87–5.11)
RDW: 18.4 % — ABNORMAL HIGH (ref 11.5–15.5)
WBC: 7.6 10*3/uL (ref 4.0–10.5)
nRBC: 0.3 % — ABNORMAL HIGH (ref 0.0–0.2)

## 2022-05-08 LAB — RENAL FUNCTION PANEL
Albumin: 2.8 g/dL — ABNORMAL LOW (ref 3.5–5.0)
Anion gap: 17 — ABNORMAL HIGH (ref 5–15)
BUN: 28 mg/dL — ABNORMAL HIGH (ref 8–23)
CO2: 20 mmol/L — ABNORMAL LOW (ref 22–32)
Calcium: 10.1 mg/dL (ref 8.9–10.3)
Chloride: 95 mmol/L — ABNORMAL LOW (ref 98–111)
Creatinine, Ser: 4.54 mg/dL — ABNORMAL HIGH (ref 0.44–1.00)
GFR, Estimated: 10 mL/min — ABNORMAL LOW (ref >60)
Glucose, Bld: 75 mg/dL (ref 70–99)
Phosphorus: 3.7 mg/dL (ref 2.5–4.6)
Potassium: 4.1 mmol/L (ref 3.5–5.1)
Sodium: 132 mmol/L — ABNORMAL LOW (ref 135–145)

## 2022-05-08 LAB — HEPARIN LEVEL (UNFRACTIONATED): Heparin Unfractionated: 0.26 IU/mL — ABNORMAL LOW (ref 0.30–0.70)

## 2022-05-08 MED ORDER — DEXTROMETHORPHAN POLISTIREX ER 30 MG/5ML PO SUER: 15.0000 mg | Freq: Two times a day (BID) | ORAL | Status: DC | PRN

## 2022-05-08 MED ORDER — HEPARIN SODIUM (PORCINE) 5000 UNIT/ML IJ SOLN
5000.0000 [IU] | Freq: Three times a day (TID) | INTRAMUSCULAR | Status: DC
  Administered 2022-05-09 – 2022-05-22 (×37): 5000 [IU] via SUBCUTANEOUS
  Filled 2022-05-08 (×38): qty 1

## 2022-05-08 NOTE — Progress Notes (Signed)
ANTICOAGULATION CONSULT NOTE  Pharmacy Consult:  Heparin Indication: chest pain/ACS  Allergies  Allergen Reactions   Sulfa Antibiotics Other (See Comments)    Per patient, both parents allergic-so will not take   Adhesive [Tape] Itching    Patient Measurements: Height: '5\' 2"'$  (157.5 cm) Weight: 53.6 kg (118 lb 2.7 oz) IBW/kg (Calculated) : 50.1 Heparin Dosing Weight: 48 kg  Vital Signs: Temp: 97.6 F (36.4 C) (05/25 2126) BP: 89/73 (05/25 2126) Pulse Rate: 77 (05/25 2126)  Labs: Recent Labs    05/06/22 0700 05/06/22 0700 05/06/22 1000 05/06/22 1000 05/06/22 1250 05/06/22 2224 05/07/22 0059 05/07/22 0933 05/07/22 2004 05/08/22 0340  HGB 9.2*  --   --   --   --   --  9.1*  --   --  9.2*  HCT 28.9*  --   --   --   --   --  27.1*  --   --  27.7*  PLT 217  --   --   --   --   --  211  --   --  216  LABPROT  --   --  14.9  --   --   --   --   --   --   --   INR  --   --  1.2  --   --   --   --   --   --   --   HEPARINUNFRC  --    < >  --   --   --  QUESTIONABLE RESULTS, RECOMMEND RECOLLECT TO VERIFY  --  <0.10* <0.10* 0.26*  CREATININE 4.92*  --   --   --   --   --  2.16*  --   --  3.81*  TROPONINIHS  --   --  272*   < > 816* 1,008* 995*  --   --   --    < > = values in this interval not displayed.     Estimated Creatinine Clearance: 10.7 mL/min (A) (by C-G formula based on SCr of 3.81 mg/dL (H)).  Assessment: 71 yo female here after recent admit for AV graft infection. Patient is HD MWF as baseline. Pharmacy consulted to dose heparin for possible ACS.  Heparin level below goal: 0.26.  No interruption or issue with heparin infusion per discussion with RN. CBC stable, No bleeding reported.  Goal of Therapy:  Heparin level 0.3-0.7 units/ml Monitor platelets by anticoagulation protocol: Yes   Plan:  Increase heparin infusion to 950 units/hr Check 8 hr heparin level Daily heparin level and CBC  Georga Bora, PharmD Clinical Pharmacist 05/08/2022 6:09 AM Please  check AMION for all [place] numbers

## 2022-05-08 NOTE — Progress Notes (Signed)
Progress Note    [name]   QQP:[id number]  DOB: 11-04-1951  DOA: 05/06/2022     2 PCP: [name], MD  Initial CC: SOB, CP  Hospital Course: [name] is a 71 yo female with PMH dCHF, ESRD on HD, CVA, CAD, HLD, HTN, GERD who presented with shortness of breath.  She was recently hospitalized from 04/26/2022 until 05/04/2022 with septic shock due to staph bacteremia from infected AV graft.  She was discharged on Ancef to complete course followed by chronic oral suppression. On admission she was found to be somnolent, hypoxic, and volume overloaded. Further work-up revealed elevated troponin which further uptrended consistent with NSTEMI.  She was evaluated by cardiology and also started on a heparin drip on admission.  She was not considered a good interventional candidate.  Interval History:  More awake and alert today.  Resting in bed comfortable still with ongoing cough.  Otherwise states some mild improvement in her diarrhea.  Assessment and Plan: 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[place]) - hospitalized 5/14 - 5/22; AV graft infection, MSSA bacteremia; evaluated by ID. Plan for Ancef post HD MWF until 6/13 then oral doxy 100 mg BID (due to CDI while on Ancef) - continue Ancef  - appreciate ID evaluation after re-admission  -Follow-up repeat blood cultures obtained on 05/06/2022: NGTD x 2 days  NSTEMI (non-ST elevated myocardial infarction) ([place]) - SOB and CP on admission; trop elevated with peak at 1,008 - continue heparin drip for 48 hrs per cardiology rec's - continue statin and BB - per cardiology, not currently an interventional candidate due to multiple medical issues currently  -Plavix monotherapy okay per cardiology  C. difficile diarrhea - s/p recent diarrhea; C diff Ag positive, toxin negative, PCR positive - started on oral Vanc - per ID continue oral vancomycin 125 mg QID x 10 days then 125 mg BID for 7 days after completion of IV abx  End-stage renal disease on  hemodialysis ([name]) - Continue chronic midodrine.  BP stable - Nephrology following, continue inpatient dialysis per nephrology  Physical deconditioning - Planning for discharge back to [facility] at discharge.  Will need insurance auth  Acute on chronic diastolic (congestive) heart failure (HCC) - Volume removal planned with HD - CXR on admission consistent with volume overload   Old records reviewed in assessment of this patient  Antimicrobials: Ancef >> current  Oral vancomycin 05/07/2022 >> current  DVT prophylaxis:  heparin injection 5,000 Units Start: 05/09/22 0600Heparin drip    Code Status:   Code Status: DNR  Disposition Plan:  SNF Status is: Inpt  Objective: Blood pressure (!) 127/51, pulse 70, temperature 97.9 F (36.6 C), temperature source Oral, resp. rate 14, height '5\' 2"'$  (1.575 m), weight 52.2 kg, SpO2 95 %.  Examination:  Physical Exam Constitutional:      General: She is not in acute distress. HENT:     Head: Normocephalic and atraumatic.     Mouth/Throat:     Mouth: Mucous membranes are moist.  Eyes:     Extraocular Movements: Extraocular movements intact.  Cardiovascular:     Rate and Rhythm: Normal rate and regular rhythm.  Pulmonary:     Effort: Pulmonary effort is normal.     Breath sounds: Normal breath sounds.  Abdominal:     General: Bowel sounds are normal. There is no distension.     Palpations: Abdomen is soft.     Tenderness: There is no abdominal tenderness.  Musculoskeletal:  General: Normal range of motion.     Cervical back: Normal range of motion and neck supple.  Skin:    General: Skin is warm and dry.  Neurological:     General: No focal deficit present.     Mental Status: She is alert.  Psychiatric:        Mood and Affect: Mood normal.     Consultants:  ID Nephrology Cardiology  Procedures:    Data Reviewed: Results for orders placed or performed during the hospital encounter of 05/06/22 (from the past 24  hour(s))  Heparin level (unfractionated)     Status: Abnormal   Collection Time: 05/07/22  8:04 PM  Result Value Ref Range   Heparin Unfractionated <0.10 (L) 0.30 - 0.70 IU/mL  Heparin level (unfractionated)     Status: Abnormal   Collection Time: 05/08/22  3:40 AM  Result Value Ref Range   Heparin Unfractionated 0.26 (L) 0.30 - 0.70 IU/mL  Basic metabolic panel     Status: Abnormal   Collection Time: 05/08/22  3:40 AM  Result Value Ref Range   Sodium 131 (L) 135 - 145 mmol/L   Potassium 4.1 3.5 - 5.1 mmol/L   Chloride 95 (L) 98 - 111 mmol/L   CO2 23 22 - 32 mmol/L   Glucose, Bld 81 70 - 99 mg/dL   BUN 21 8 - 23 mg/dL   Creatinine, Ser 3.81 (H) 0.44 - 1.00 mg/dL   Calcium 10.0 8.9 - 10.3 mg/dL   GFR, Estimated 12 (L) >60 mL/min   Anion gap 13 5 - 15  CBC with Differential/Platelet     Status: Abnormal   Collection Time: 05/08/22  3:40 AM  Result Value Ref Range   WBC 7.6 4.0 - 10.5 K/uL   RBC 2.82 (L) 3.87 - 5.11 MIL/uL   Hemoglobin 9.2 (L) 12.0 - 15.0 g/dL   HCT 27.7 (L) 36.0 - 46.0 %   MCV 98.2 80.0 - 100.0 fL   MCH 32.6 26.0 - 34.0 pg   MCHC 33.2 30.0 - 36.0 g/dL   RDW 18.4 (H) 11.5 - 15.5 %   Platelets 216 150 - 400 K/uL   nRBC 0.3 (H) 0.0 - 0.2 %   Neutrophils Relative % 43 %   Neutro Abs 3.3 1.7 - 7.7 K/uL   Lymphocytes Relative 28 %   Lymphs Abs 2.1 0.7 - 4.0 K/uL   Monocytes Relative 21 %   Monocytes Absolute 1.6 (H) 0.1 - 1.0 K/uL   Eosinophils Relative 6 %   Eosinophils Absolute 0.5 0.0 - 0.5 K/uL   Basophils Relative 1 %   Basophils Absolute 0.1 0.0 - 0.1 K/uL   Immature Granulocytes 1 %   Abs Immature Granulocytes 0.05 0.00 - 0.07 K/uL  Magnesium     Status: None   Collection Time: 05/08/22  3:40 AM  Result Value Ref Range   Magnesium 2.3 1.7 - 2.4 mg/dL    I have Reviewed nursing notes, Vitals, and Lab results since pt's last encounter. Pertinent lab results : see above I have ordered test including BMP, CBC, Mg I have reviewed the last note from  staff over past 24 hours I have discussed pt's care plan and test results with nursing staff, case manager   LOS: 2 days   Dwyane Dee, MD [facility] 05/08/2022, 5:31 PM

## 2022-05-08 NOTE — Evaluation (Signed)
Occupational Therapy Evaluation Patient Details Name: [name] MRN: [id number] DOB: 05-31-1951 Today's Date: 05/08/2022   History of Present Illness Pt is a 71 y.o. female admitted 05/06/22 with SOB and infection. 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 S/p RUE AVG excision with revision 5/15. Course complicated by hypotension perioperatively; ETT 5/15-5/16. Workup for septic shock secondary to MRSA bacteremia. PMH includes ESRD (HD MWF), CKD, CHF, HTN, HLD.   Clinical Impression   Patient with recent admission on 04/26/22 and had been at a SNF for 2 days prior to this admission. Patient poor historian therefore information gathered is from previous admission. Currently, patient is presenting with fatigue, lethargy, poor cognition and activity tolerance. Patient is max to total A for bed mobility and ADLs, and unable to attempt transfers without +2 assist. OT recommending return to SNF once medically stable, OT will continue to follow acutely.      Recommendations for follow up therapy are one component of a multi-disciplinary discharge planning process, led by the attending physician.  Recommendations may be updated based on patient status, additional functional criteria and insurance authorization.   Follow Up Recommendations  Skilled nursing-short term rehab (<3 hours/day)    Assistance Recommended at Discharge Frequent or constant Supervision/Assistance  Patient can return home with the following Two people to help with walking and/or transfers;A lot of help with bathing/dressing/bathroom;Assistance with cooking/housework;Direct supervision/assist for medications management;Direct supervision/assist for financial management;Assist for transportation;Help with stairs or ramp for entrance;Assistance with feeding    Functional Status Assessment  Patient has had a recent decline in their functional status and demonstrates the  ability to make significant improvements in function in a reasonable and predictable amount of time.  Equipment Recommendations  Other (comment) (defer to next venue)    Recommendations for Other Services       Precautions / Restrictions Precautions Precautions: Fall Restrictions Weight Bearing Restrictions: No      Mobility Bed Mobility Overal bed mobility: Needs Assistance Bed Mobility: Rolling, Sidelying to Sit, Sit to Sidelying Rolling: Mod assist   Supine to sit: Max assist, Total assist Sit to supine: Max assist, Total assist   General bed mobility comments: cues to sequence, mod A to bring legs off of bed, max to total to come into sitting and to return to supine, significant pushing and posterior lean, benefitting from cues to hug therapist to engage core    Transfers Overall transfer level: Needs assistance                 General transfer comment: unable to complete due to significant posterior lean and soiled self upon sitting EOB      Balance Overall balance assessment: Needs assistance Sitting-balance support: Bilateral upper extremity supported, Feet supported Sitting balance-Leahy Scale: Poor   Postural control: Posterior lean     Standing balance comment: unable to complete this date                           ADL either performed or assessed with clinical judgement   ADL Overall ADL's : Needs assistance/impaired Eating/Feeding: Bed level;Minimal assistance   Grooming: Min guard;Bed level   Upper Body Bathing: Moderate assistance;Bed level   Lower Body Bathing: Moderate assistance;Maximal assistance;Bed level   Upper Body Dressing : Moderate assistance;Bed level Upper Body Dressing Details (indicate cue type and reason): change gown after soiled bed Lower Body Dressing: Total assistance;Bed level;Sitting/lateral leans Lower  Body Dressing Details (indicate cue type and reason): require assist for socks   Toilet Transfer  Details (indicate cue type and reason): unable Toileting- Clothing Manipulation and Hygiene: Total assistance;+2 for physical assistance;+2 for safety/equipment;Bed level Toileting - Clothing Manipulation Details (indicate cue type and reason): total to clean up after BM, but aware she had soiled herself     Functional mobility during ADLs: Maximal assistance;Cueing for safety;Cueing for sequencing General ADL Comments: Patient presenting with lethargy, poor activity tolerance and decreased cognition     Vision   Vision Assessment?: No apparent visual deficits     Perception     Praxis      Pertinent Vitals/Pain Pain Assessment Pain Assessment: Faces Faces Pain Scale: Hurts a little bit Pain Location: generalized with mobility Pain Descriptors / Indicators: Grimacing Pain Intervention(s): Limited activity within patient's tolerance, Monitored during session, Repositioned     Hand Dominance Right   Extremity/Trunk Assessment Upper Extremity Assessment Upper Extremity Assessment: Generalized weakness   Lower Extremity Assessment Lower Extremity Assessment: Defer to PT evaluation   Cervical / Trunk Assessment Cervical / Trunk Assessment: Kyphotic   Communication Communication Communication: No difficulties   Cognition Arousal/Alertness: Lethargic Behavior During Therapy: WFL for tasks assessed/performed Overall Cognitive Status: No family/caregiver present to determine baseline cognitive functioning                                 General Comments: patient unable to open eyes for longer than a few seconds but would answer basic questions 75% of the time, knows she is in the hospital and that she had a bowel movement, otherwise did not follow commands and requiring multi-modal cues to attempt basic bed mobility     General Comments       Exercises     Shoulder Instructions      Home Living Family/patient expects to be discharged to:: Private  residence Living Arrangements: Parent (mother who has dementia) Available Help at Discharge: Family;Available PRN/intermittently;Personal care attendant Type of Home: House Home Access: Ramped entrance     Home Layout: One level     Bathroom Shower/Tub: Sponge bathes at baseline   Bathroom Toilet: Standard Bathroom Accessibility: Yes   Home Equipment: Rollator (4 wheels);Shower seat;Cane - single point;Grab bars - toilet;Grab bars - tub/shower;BSC/3in1;Wheelchair - manual;Rolling Walker (2 wheels)   Additional Comments: Aide comes MWF to get pt cleaned and ready and out for SCAT to pick up for HD sessions (info pulled through from 5/17 admission due to patient's level alertness)      Prior Functioning/Environment Prior Level of Function : Patient poor historian/Family not available;Needs assist             Mobility Comments: Pt reports short household mobility with Rollator use, intermittently SPC, denies any recent falls. Has been using w/c more recently per pt ADLs Comments: reports aide assists with bathing/dressing MWF, but able to basin bathe/dress/toilet on her own on the other days.  Uses meals on wheels.        OT Problem List: Decreased strength;Impaired balance (sitting and/or standing);Decreased activity tolerance;Decreased coordination;Decreased cognition;Decreased safety awareness;Decreased knowledge of use of DME or AE;Decreased knowledge of precautions      OT Treatment/Interventions: Self-care/ADL training;Therapeutic exercise;DME and/or AE instruction;Therapeutic activities;Patient/family education;Balance training    OT Goals(Current goals can be found in the care plan section) Acute Rehab OT Goals Patient Stated Goal: unable to state OT Goal Formulation: Patient unable to participate  in goal setting Time For Goal Achievement: 05/21/22 Potential to Achieve Goals: Fair ADL Goals Pt Will Perform Lower Body Bathing: with min assist;sitting/lateral  leans;with adaptive equipment Pt Will Perform Lower Body Dressing: with min assist;with adaptive equipment;sitting/lateral leans Pt Will Transfer to Toilet: with mod assist;stand pivot transfer;bedside commode Additional ADL Goal #1: Patient will be able to sit EOB for 3-5 minutes with min gaurd assist to be able to complete ADLs. Additional ADL Goal #2: Patient will demonstrate increased alert affect to follow commands consistently in OT session without cues for reorientation.  OT Frequency: Min 2X/week    Co-evaluation              AM-PAC OT "6 Clicks" Daily Activity     Outcome Measure Help from another person eating meals?: A Little Help from another person taking care of personal grooming?: A Little Help from another person toileting, which includes using toliet, bedpan, or urinal?: Total Help from another person bathing (including washing, rinsing, drying)?: A Lot Help from another person to put on and taking off regular upper body clothing?: A Lot Help from another person to put on and taking off regular lower body clothing?: Total 6 Click Score: 12   End of Session Nurse Communication: Mobility status  Activity Tolerance: Patient limited by lethargy;Patient limited by fatigue Patient left: in bed;with call bell/phone within reach;with bed alarm set  OT Visit Diagnosis: Other abnormalities of gait and mobility (R26.89);Muscle weakness (generalized) (M62.81);Other symptoms and signs involving cognitive function                Time: 7829-5621 OT Time Calculation (min): 20 min Charges:  OT General Charges $OT Visit: 1 Visit OT Evaluation $OT Eval Moderate Complexity: 1 Mod  Pollyann Glen E. Saliah Crisp, OTR/L Acute Rehabilitation Services [phone]   Cherlyn Cushing 05/08/2022, 10:13 AM

## 2022-05-08 NOTE — Progress Notes (Signed)
   05/08/22 0613  Vitals  Temp 98.1 F (36.7 C)  Temp Source Oral  BP (!) 86/31  MAP (mmHg) (!) 48  BP Method Automatic  Pulse Rate 91  Pulse Rate Source Monitor  MEWS COLOR  MEWS Score Color Green  Oxygen Therapy  SpO2 97 %  MEWS Score  MEWS Temp 0  MEWS Systolic 1  MEWS Pulse 0  MEWS RR 0  MEWS LOC 0  MEWS Score 1  Provider Notification  Provider Name/Title Jamison Neighbor  Date Provider Notified 05/08/22  Time Provider Notified [phone]  Method of Notification  (secure chat)  Notification Reason Other (Comment) (low BP)  Provider response Other (Comment) (verbal orders to give midodrine)

## 2022-05-08 NOTE — Evaluation (Signed)
Physical Therapy Evaluation Patient Details Name: [name] MRN: [id number] DOB: [birth year] Today's Date: 05/08/2022  History of Present Illness  Pt is a 71 y.o. female admitted 05/06/22 with SOB and infection. 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 S/p RUE AVG excision with revision 5/15. Course complicated by hypotension perioperatively; ETT 5/15-5/16. Workup for septic shock secondary to MRSA bacteremia. PMH includes ESRD (HD MWF), CKD, CHF, HTN, HLD.   Clinical Impression  Patient received in bed, lunch try set up, not eating. States she does not want to eat. Patient is agreeable to PT session. Not oriented. She requires multiple attempts to get seated at edge of bed with mod assist, mod A to stand with walker briefly. Limited engagement in session. Requiring multiple cues but does not respond. She will continue to benefit from skilled PT to improve independence and safety with mobility.         Recommendations for follow up therapy are one component of a multi-disciplinary discharge planning process, led by the attending physician.  Recommendations may be updated based on patient status, additional functional criteria and insurance authorization.  Follow Up Recommendations Skilled nursing-short term rehab (<3 hours/day)    Assistance Recommended at Discharge Frequent or constant Supervision/Assistance  Patient can return home with the following  A lot of help with bathing/dressing/bathroom;Assistance with cooking/housework;Direct supervision/assist for medications management;Direct supervision/assist for financial management;Assist for transportation;Help with stairs or ramp for entrance;A lot of help with walking and/or transfers    Equipment Recommendations None recommended by PT  Recommendations for Other Services       Functional Status Assessment Patient has had a recent decline in their functional status and  demonstrates the ability to make significant improvements in function in a reasonable and predictable amount of time.     Precautions / Restrictions Precautions Precautions: Fall Restrictions Weight Bearing Restrictions: No      Mobility  Bed Mobility Overal bed mobility: Needs Assistance Bed Mobility: Supine to Sit, Sit to Supine     Supine to sit: Mod assist Sit to supine: Independent   General bed mobility comments: patient with poor initiation. States "okay" when asked to get up to edge of bed but does not move. Required mod assist to get sitting on edge of bed and will spontaneously lie back down multiple times.    Transfers Overall transfer level: Needs assistance Equipment used: Rolling walker (2 wheels) Transfers: Sit to/from Stand Sit to Stand: Mod assist           General transfer comment: mod assist to get standing, then spontaneously sat back donw.    Ambulation/Gait               General Gait Details: unable to ambulate prior to  sitting back down despite cues  Stairs            Wheelchair Mobility    Modified Rankin (Stroke Patients Only)       Balance Overall balance assessment: Needs assistance Sitting-balance support: Feet supported Sitting balance-Leahy Scale: Fair     Standing balance support: Bilateral upper extremity supported, During functional activity, Reliant on assistive device for balance Standing balance-Leahy Scale: Poor Standing balance comment: stood for brief time this session                             Pertinent Vitals/Pain Pain Assessment Pain Assessment: No/denies pain    Home  Living Family/patient expects to be discharged to:: Skilled nursing facility   Available Help at Discharge: Family;Available PRN/intermittently;Personal care attendant Type of Home: House Home Access: Ramped entrance       Home Layout: One level Home Equipment: Rollator (4 wheels);Shower seat;Cane - single  point;Grab bars - toilet;Grab bars - tub/shower;BSC/3in1;Wheelchair - manual;Rolling Walker (2 wheels) Additional Comments: Aide comes MWF to get pt cleaned and ready and out for SCAT to pick up for HD sessions (info pulled through from 5/17 admission due to patient's level alertness)    Prior Function Prior Level of Function : Patient poor historian/Family not available;Needs assist             Mobility Comments: Pt reports short household mobility with Rollator use, intermittently SPC, denies any recent falls. Has been using w/c more recently per pt ADLs Comments: reports aide assists with bathing/dressing MWF, but able to basin bathe/dress/toilet on her own on the other days.  Uses meals on wheels.     Hand Dominance   Dominant Hand: Right    Extremity/Trunk Assessment   Upper Extremity Assessment Upper Extremity Assessment: Generalized weakness    Lower Extremity Assessment Lower Extremity Assessment: Generalized weakness       Communication   Communication: No difficulties  Cognition Arousal/Alertness: Awake/alert Behavior During Therapy: WFL for tasks assessed/performed Overall Cognitive Status: No family/caregiver present to determine baseline cognitive functioning                                 General Comments: patient is alert this pm, unable to answer questions. Difficulty following direction        General Comments      Exercises     Assessment/Plan    PT Assessment Patient needs continued PT services  PT Problem List Decreased strength;Decreased range of motion;Decreased activity tolerance;Decreased balance;Decreased mobility;Decreased coordination;Decreased cognition;Decreased safety awareness;Cardiopulmonary status limiting activity;Decreased knowledge of use of DME       PT Treatment Interventions DME instruction;Gait training;Functional mobility training;Therapeutic activities;Therapeutic exercise;Balance training;Neuromuscular  re-education;Cognitive remediation;Patient/family education    PT Goals (Current goals can be found in the Care Plan section)  Acute Rehab PT Goals Patient Stated Goal: to return to nursing home Time For Goal Achievement: 05/22/22 Potential to Achieve Goals: Good    Frequency Min 2X/week     Co-evaluation               AM-PAC PT "6 Clicks" Mobility  Outcome Measure Help needed turning from your back to your side while in a flat bed without using bedrails?: A Lot Help needed moving from lying on your back to sitting on the side of a flat bed without using bedrails?: A Lot Help needed moving to and from a bed to a chair (including a wheelchair)?: A Lot Help needed standing up from a chair using your arms (e.g., wheelchair or bedside chair)?: A Lot Help needed to walk in hospital room?: Total Help needed climbing 3-5 steps with a railing? : Total 6 Click Score: 10    End of Session   Activity Tolerance: Patient limited by fatigue;Other (comment) (limited by cognition) Patient left: in bed;with call bell/phone within reach;with bed alarm set Nurse Communication: Mobility status PT Visit Diagnosis: Unsteadiness on feet (R26.81);Other abnormalities of gait and mobility (R26.89);Muscle weakness (generalized) (M62.81);Difficulty in walking, not elsewhere classified (R26.2)    Time: 1430-1440 PT Time Calculation (min) (ACUTE ONLY): 10 min   Charges:  PT Evaluation $PT Eval Moderate Complexity: 1 Mod          Tameisha Covell, PT, GCS 05/08/22,2:46 PM

## 2022-05-08 NOTE — Progress Notes (Addendum)
[facility] Progress Note   Subjective: Seen in room lying flat on back, eyes closed.  No C/Os. Denies SOB/CP. HD later today.   Objective Vitals:   05/07/22 1618 05/07/22 2126 05/08/22 0613 05/08/22 0820  BP: (!) 102/33 (!) 89/73 (!) 86/31 (!) 121/58  Pulse: 88 77 91 86  Resp: 17 17    Temp: 98.4 F (36.9 C) 97.6 F (36.4 C) 98.1 F (36.7 C)   TempSrc: Oral  Oral   SpO2: 98% 96% 97%   Weight:      Height:       Physical Exam General: Frail, chronically ill appearing older female in NAD Heart: S5.K8 1/6 systolic M. No JVD Lungs: CTAB Abdomen: flat, NABS Extremities: No LE edema.  Dialysis Access: R AVG + T/B, large drsg covering. 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   Liver Function Tests: Recent Labs  Lab 05/03/22 2035 05/06/22 0700  AST  --  31  ALT  --  5  ALKPHOS  --  105  BILITOT  --  1.0  PROT  --  7.3  ALBUMIN 2.5* 3.2*   No results for input(s): LIPASE, AMYLASE in the last 168 hours. CBC: Recent Labs  Lab 05/03/22 2035 05/04/22 0443 05/06/22 0700 05/07/22 0059 05/08/22 0340  WBC 8.5 8.5 15.4* 9.1 7.6  NEUTROABS  --   --  12.6*  --  3.3  HGB 8.8* 8.9* 9.2* 9.1* 9.2*  HCT 25.9* 26.3* 28.9* 27.1* 27.7*  MCV 94.9 95.3 101.8* 98.2 98.2  PLT 218 218 217 211 216   Blood Culture    Component Value Date/Time   SDES BLOOD SITE NOT SPECIFIED 05/06/2022 1005   SPECREQUEST  05/06/2022 1005    BOTTLES DRAWN AEROBIC AND ANAEROBIC Blood Culture adequate volume   CULT  05/06/2022 1005     NO GROWTH 2 DAYS Performed at [facility] [address] [place], [place] [address]    REPTSTATUS PENDING 05/06/2022 1005    Cardiac Enzymes: No results for input(s): CKTOTAL, CKMB, CKMBINDEX, TROPONINI in the last 168 hours. CBG: No results for input(s): GLUCAP in the last 168 hours. Iron Studies: No results for input(s): IRON, TIBC, TRANSFERRIN, FERRITIN in the last 72 hours. '@lablastinr3'$ @ Studies/Results: No results found. Medications:   ceFAZolin (ANCEF) IV     heparin 950 Units/hr (05/08/22 0620)    atorvastatin  80 mg Oral Daily   B-complex with vitamin C  1 tablet Oral Daily   And   folic acid  1 mg Oral Daily   Chlorhexidine Gluconate Cloth  6 each Topical Q0600   Chlorhexidine Gluconate Cloth  6 each Topical Q0600   cinacalcet  30 mg Oral Q M,W,F-HD   clopidogrel  75 mg Oral  Daily   escitalopram  10 mg Oral Daily   famotidine  20 mg Oral Daily   feeding supplement  237 mL Oral BID BM   metoprolol tartrate  12.5 mg Oral BID   midodrine  5 mg Oral TID WC   sevelamer carbonate  1,600 mg Oral TID WC   sodium chloride flush  3 mL Intravenous Q12H   [START ON 05/17/2022] vancomycin  125 mg Oral Q12H   vancomycin  125 mg Oral QID     Dialysis Orders: [facility]  4h  350/600  45kg   2/2 bath  P2  AVG RUA  Hep none - last HD 5/12, 48.5 > 46.2kg - last Hb 10.8 on 5/08 - mircera 50 mcg IV q 4 weeks - hectorol 2 ug IV tiw - sensipar 30 tiw po    Assessment/Plan: Acute hypoxic respiratory failure: CXR with likely pulmonary edema. HD Wednesday with net UF 2L.  Breathing improved. Currently on RA.  Leukocytosis/lactic acidosis: Improved. BC with NGTD. C Diff + - per PMD NSTEMI - troponin elevated. cardiology following. No CP this AM.  Recent MSSA sepsis/ AVG infection -plan was to continue IV cefazolin until 6/13 with plans for oral cephalosporin thereafter for suppression ESRD - on HD MWF.  AVG required resection during last admit but still functional.  Plan  for dialysis today per regular schedule. BP/ volume - has had problems with low blood pressures using midodrine.  Does not appear volume overloaded but if weights correct not close to EDW.  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  Has received ESA recently.  Consider further ESA based on progress BMD ckd - C Ca 10.6 today.  Hypercalcemia on admit, will use low Ca bath. No recent PO4. Added to today's labs. Hold hectorol. Continue home sensipar Nutrition - renal diet w/fluid restrictions. Alb 3.2 - continue supplements.    Rita H. Brown NP-C 05/08/2022, 9:36 AM  [facility] [phone]  Patient seen and examined, agree with above note with above modifications. Fairly stable-  pleasant dementia-  seems to be improving from a c diff standpoint  plan for routine HD today -  will eventually need discharge to SNF Corliss Parish, MD 05/08/2022

## 2022-05-08 NOTE — Progress Notes (Addendum)
Progress Note  Patient Name: [name] Date of Encounter: 05/08/2022  Primary Cardiologist: Lauree Chandler, MD  Subjective   Denies any CP, SOB, Resting comfortably.  Inpatient Medications    Scheduled Meds:  atorvastatin  80 mg Oral Daily   B-complex with vitamin C  1 tablet Oral Daily   And   folic acid  1 mg Oral Daily   Chlorhexidine Gluconate Cloth  6 each Topical Q0600   Chlorhexidine Gluconate Cloth  6 each Topical Q0600   cinacalcet  30 mg Oral Q M,W,F-HD   clopidogrel  75 mg Oral Daily   escitalopram  10 mg Oral Daily   famotidine  20 mg Oral Daily   feeding supplement  237 mL Oral BID BM   metoprolol tartrate  12.5 mg Oral BID   midodrine  5 mg Oral TID WC   sevelamer carbonate  1,600 mg Oral TID WC   sodium chloride flush  3 mL Intravenous Q12H   [START ON 05/17/2022] vancomycin  125 mg Oral Q12H   vancomycin  125 mg Oral QID   Continuous Infusions:   ceFAZolin (ANCEF) IV     heparin 950 Units/hr (05/08/22 0620)   PRN Meds: acetaminophen **OR** acetaminophen, albuterol, calcium carbonate (dosed in mg elemental calcium), camphor-menthol **AND** hydrOXYzine, docusate sodium, feeding supplement (NEPRO CARB STEADY), hydrALAZINE, ondansetron **OR** ondansetron (ZOFRAN) IV, sorbitol, zolpidem   Vital Signs    Vitals:   05/07/22 1618 05/07/22 2126 05/08/22 0613 05/08/22 0820  BP: (!) 102/33 (!) 89/73 (!) 86/31 (!) 121/58  Pulse: 88 77 91 86  Resp: 17 17    Temp: 98.4 F (36.9 C) 97.6 F (36.4 C) 98.1 F (36.7 C)   TempSrc: Oral  Oral   SpO2: 98% 96% 97%   Weight:      Height:        Intake/Output Summary (Last 24 hours) at 05/08/2022 0837 Last data filed at 05/08/2022 5009 Gross per 24 hour  Intake 837.62 ml  Output --  Net 837.62 ml      05/07/2022    6:06 AM 05/06/2022   10:00 PM  Last 3 Weights  Weight (lbs) 118 lb 2.7 oz 115 lb 15.4 oz  Weight (kg) 53.6 kg 52.6 kg     Telemetry    NSR - Personally Reviewed   Physical Exam    GEN: No acute distress. Frail appearing. HEENT: Normocephalic, atraumatic, sclera non-icteric. Neck: No JVD or bruits. Cardiac: RRR soft SEM without rubs or gallops.  Respiratory: Clear to auscultation bilaterally. Breathing is unlabored. GI: Soft, nontender, non-distended, BS +x 4. MS: no deformity. Extremities: No clubbing or cyanosis. No edema. Distal pedal pulses are 2+ and equal bilaterally. Neuro:  AAOx3. Follows commands. Psych:  Responds to questions appropriately with a normal affect.  Labs    High Sensitivity Troponin:   Recent Labs  Lab 05/06/22 1000 05/06/22 1250 05/06/22 2224 05/07/22 0059  TROPONINIHS 272* 816* 1,008* 995*      Cardiac EnzymesNo results for input(s): TROPONINI in the last 168 hours. No results for input(s): TROPIPOC in the last 168 hours.   Chemistry Recent Labs  Lab 05/03/22 2035 05/04/22 0443 05/06/22 0700 05/07/22 0059 05/08/22 0340  NA 129*   < > 135 132* 131*  K 4.2   < > 5.3* 4.2 4.1  CL 93*   < > 97* 94* 95*  CO2 24   < > '23 25 23  '$ GLUCOSE 117*   < > 140* 99 81  BUN 33*   < > 36* 9 21  CREATININE 5.11*   < > 4.92* 2.16* 3.81*  CALCIUM 9.7   < > 10.6* 9.1 10.0  PROT  --   --  7.3  --   --   ALBUMIN 2.5*  --  3.2*  --   --   AST  --   --  31  --   --   ALT  --   --  5  --   --   ALKPHOS  --   --  105  --   --   BILITOT  --   --  1.0  --   --   GFRNONAA 9*   < > 9* 24* 12*  ANIONGAP 12   < > '15 13 13   '$ < > = values in this interval not displayed.     Hematology Recent Labs  Lab 05/06/22 0700 05/07/22 0059 05/08/22 0340  WBC 15.4* 9.1 7.6  RBC 2.84* 2.76* 2.82*  HGB 9.2* 9.1* 9.2*  HCT 28.9* 27.1* 27.7*  MCV 101.8* 98.2 98.2  MCH 32.4 33.0 32.6  MCHC 31.8 33.6 33.2  RDW 17.9* 18.2* 18.4*  PLT 217 211 216    BNP Recent Labs  Lab 05/06/22 1000  BNP >4,500.0*     DDimer No results for input(s): DDIMER in the last 168 hours.   Radiology    No results found.  Cardiac Studies   Limited echo 04/28/22    1. No left ventricular thrombus is seen. Left ventricular ejection  fraction, by estimation, is 50 to 55%. The left ventricle has low normal  function. The left ventricle demonstrates regional wall motion  abnormalities (see scoring diagram/findings for  description). There is moderate hypokinesis of the left ventricular,  entire inferolateral wall.   2. The right ventricular size is moderately enlarged. There is severely  elevated pulmonary artery systolic pressure.   3. Left atrial size was moderately dilated.   4. Right atrial size was moderately dilated.   5. Mild to moderate mitral valve regurgitation. No evidence of mitral  stenosis.   6. Tricuspid valve regurgitation is moderate to severe.   7. The aortic valve is tricuspid. There is moderate calcification of the  aortic valve. There is moderate thickening of the aortic valve. Aortic  valve regurgitation is trivial. Aortic valve sclerosis/calcification is  present, without any evidence of  aortic stenosis.   8. The inferior vena cava is dilated in size with <50% respiratory  variability, suggesting right atrial pressure of 15 mmHg.   Conclusion(s)/Recommendation(s): No evidence of valvular vegetations on  this transthoracic echocardiogram. Consider a transesophageal  echocardiogram to exclude infective endocarditis if clinically indicated.   Patient Profile     71 y.o. female with ESRD on HD, CHF, CAD (on CT chest, no prior heart catheterizations), chronic diastolic CHF, anemia, TIA, ventricular tachycardia, HLD, HTN, PAD s/p LE intervention by [facility] 03/2022, chronic respiratory failure on home O2, recent AV fistula infection. Complex history noted. Prior nuc 02/2020 negative for ischemia. During hospitalization 03/2020, had RUE fistula placed c/b shock, hypotension, hyperkalemia, VT requiring conversion to NSR -> new WMA felt 2/2 stress cardiomyopathy, Had CP 05/2020 with chest pain/NSTEMI troponin 2900, sx improved with fluid removal  with HD, patient refused cath. Had TIA 02/2021. At last OV with [name] 12/2021, pt was felt to have high likelihood of underlying CAD but declined cath. In 03/2022, She underwent LE angiogram s/p LE stenting by vascular surgery at  which time Plavix was started in addition to ASA. Recently hospitalized 5/14-04/14/2022 with AV fistula infection with staph bacteremia requiring I&D and graft revision, c/b septic shock requiring Levophed, intubation. ASA discontinued. Readmitted 05/06/22 with SOB and acute on chronic respiratory failure requiring NRB. On admission she was found to be somnolent, hypoxic, and volume overloaded. Cardiology consulted for CP/elevated troponin. Also found to have C diff diarrhea.  Assessment & Plan    1. Acute on chronic respiratory failure with concomitant acute on chronic diastolic CHF, ESRD on HD, severe pulm HTN by echo 04/28/22 - volume overload managed with HD in ESRD patient - weaned to RA  2. NSTEMI/presumed underlying CAD, HLD goal LDL <70 -hsTroponin peaked at 1008 - given recurrent elevated troponin events there is a high suspicion of underlying CAD though per prior notes, patient is felt to be poor candidate for pursuit of cardiac cath this admission with active infection issues with graft problems, C diff colitis, and DNR status - ? Consider re-adding ASA to Plavix - Dr. Marlou Porch recommended 48 hours of heparin - was ordered 5/24 but first dosing appears to have been 5/25 @ 0022, have amended order so pharmacy is aware of 48 hours of total heparin then change to DVT ppx - ? Repeat limited echo - will review with MD whether recent study will suffice- recently assessed 5/16 with EF 50-55% wit moderate HK of entire inf/lat wall, moderate RVE, severe pulmonary HTN, moderate-severe TR - low dose BB as tolerated, hypotension otherwise precludes aggressive med rx - statin on board - not a good candidate for cardiac rehab with multiple comorbidities active  3. H/o VT -  metoprolol held last PM presumably due to BP - will discuss dosing with MD  4. PAD - on Plavix in setting of LE stenting 03/2022 per vascular surgery, ASA discontinued last admission earlier this month  Otherwise, per primary: - recent AV graft infection, staph bacteremia -> ID also on board - h/o TIA - C diff diarrhea - deconditioning, weakness - chronic anemia - DNR  For questions or updates, please contact [facility] Please consult [email or web address] for contact info under Cardiology/STEMI.  Signed, Charlie Pitter, PA-C 05/08/2022, 8:37 AM    Personally seen and examined. Agree with above.  I am comfortable with her being on Plavix monotherapy.  No other changes from cardiac perspective.  Complete full 48 hours of IV heparin.  We will go ahead and sign off.  Please let us know if we can be of further assistance.  Candee Furbish, MD

## 2022-05-08 NOTE — TOC Initial Note (Signed)
Transition of Care [facility]) - Initial/Assessment Note    Patient Details  Name: [name] MRN: [id number] Date of Birth: 1951/06/23  Transition of Care [facility]) CM/SW Contact:    Emeterio Reeve, LCSW Phone Number: 05/08/2022, 2:26 PM  Clinical Narrative:                  Pt is from [facility] for SNF. Pt is able to return to complete rehab. Pts insurance Josem Kaufmann will need to be started in [place].   Expected Discharge Plan: Skilled Nursing Facility Barriers to Discharge: Continued Medical Work up, Ship broker   Patient Goals and CMS Choice Patient states their goals for this hospitalization and ongoing recovery are:: return to SNF      Expected Discharge Plan and Services Expected Discharge Plan: [place]       Living arrangements for the past 2 months: [place]                                      Prior Living Arrangements/Services Living arrangements for the past 2 months: [place] Lives with:: Adult Children Patient language and need for interpreter reviewed:: Yes Do you feel safe going back to the place where you live?: Yes      Need for Family Participation in Patient Care: Yes (Comment) Care giver support system in place?: Yes (comment)   Criminal Activity/Legal Involvement Pertinent to Current Situation/Hospitalization: No - Comment as needed  Activities of Daily Living Home Assistive Devices/Equipment: Wheelchair, Environmental consultant (specify type), Shower chair with back ADL Screening (condition at time of admission) Patient's cognitive ability adequate to safely complete daily activities?: No Is the patient deaf or have difficulty hearing?: No Does the patient have difficulty seeing, even when wearing glasses/contacts?: No Does the patient have difficulty concentrating, remembering, or making decisions?: Yes Patient able to express need for assistance with ADLs?: Yes Does the patient have difficulty  dressing or bathing?: Yes Independently performs ADLs?: No Communication: Independent Dressing (OT): Needs assistance Is this a change from baseline?: Pre-admission baseline Grooming: Needs assistance Is this a change from baseline?: Pre-admission baseline Feeding: Independent Bathing: Needs assistance Is this a change from baseline?: Pre-admission baseline Toileting: Needs assistance Is this a change from baseline?: Pre-admission baseline In/Out Bed: Needs assistance Is this a change from baseline?: Pre-admission baseline Walks in Home: Dependent Is this a change from baseline?: Pre-admission baseline Does the patient have difficulty walking or climbing stairs?: Yes Weakness of Legs: Both Weakness of Arms/Hands: None  Permission Sought/Granted Permission sought to share information with : Family Supports, Chartered certified accountant granted to share information with : Yes, Verbal Permission Granted     Permission granted to share info w AGENCY: SNF        Emotional Assessment Appearance:: Appears stated age Attitude/Demeanor/Rapport: Engaged Affect (typically observed): Appropriate Orientation: : Oriented to Self, Oriented to Place, Oriented to  Time, Oriented to Situation Alcohol / Substance Use: Not Applicable Psych Involvement: No (comment)  Admission diagnosis:  Hyperkalemia [E87.5] Stage 5 chronic kidney disease on chronic dialysis ([place]) [N18.6, Z99.2] Hypervolemia associated with renal insufficiency [E87.70, N28.9] Sepsis, due to unspecified organism, unspecified whether acute organ dysfunction present [facility]) [A41.9] Patient Active Problem List   Diagnosis Date Noted   Physical deconditioning 05/07/2022   Hypervolemia associated with renal insufficiency 05/06/2022   DNR (do not resuscitate) 05/06/2022   Sepsis ([place])  MSSA bacteremia    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[place]) 04/26/2022   Arteriovenous fistula, acquired ([place]) 26/20/3559   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   Dysphagia 02/19/2022   Anemia of chronic renal failure 02/19/2022   Hypertensive heart and chronic kidney disease with heart failure and with stage 5 chronic kidney disease, or end stage renal disease ([place]) 02/19/2022   Mild cognitive impairment 02/19/2022   Major depression, single episode 02/19/2022   Chronic diastolic CHF (congestive heart failure) ([place]) 01/23/2022   Fall at home, initial encounter 01/23/2022   Acute on chronic diastolic (congestive) heart failure ([place]) 11/08/2021   GERD (gastroesophageal reflux disease)    Other disorders of calcium metabolism 10/17/2021   Protein-calorie malnutrition, severe 08/12/2021   Acute respiratory failure with hypoxia ([place]) 08/06/2021   Major depressive disorder, recurrent episode, moderate (HCC) 02/23/2021   Prolonged QT interval 02/21/2021   Allergy, unspecified, initial encounter 08/08/2020   NSTEMI (non-ST elevated myocardial infarction) ([place]) 05/14/2020   Ventricular tachycardia ([place]) 03/15/2020   Seizure ([place]) 74/16/3845   Complication of vascular access for dialysis 12/21/2019   Breakdown (mechanical) of surgically created arteriovenous fistula, initial encounter ([place]) 12/18/2019   Weakness 10/11/2019   Aneurysm artery, 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 difficile diarrhea 04/17/2019   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   Primary generalized (osteo)arthritis 04/17/2019   Pure hypercholesterolemia, unspecified 04/17/2019   Secondary hyperparathyroidism of renal origin ([place]) 04/17/2019   Gastro-esophageal reflux disease without esophagitis 04/17/2019   Essential (primary) hypertension 04/17/2019   Hyperlipidemia,  unspecified 04/17/2019   Transient cerebral ischemic attack, unspecified 04/17/2019   Prolonged Q-T interval on ECG 02/24/2017   Malnutrition of moderate degree 12/24/2016   Problem with dialysis access ([place]) 12/21/2016   Neurologic abnormality 11/19/2015   Anxiety 11/19/2015   Insomnia 11/19/2015   Gait instability    Dizziness 05/09/2015   Ataxia 05/09/2015   H/O: CVA (cerebrovascular accident) 05/09/2015   Left facial numbness 05/09/2015   Left leg numbness 05/09/2015   Hyperlipidemia    SOB (shortness of breath) 04/01/2013   HTN (hypertension) 04/01/2013   End-stage renal disease on hemodialysis ([place]) 11/07/2012   Dyspnea 12/31/2011   PCP:  [name], MD Pharmacy:   [facility] [address] [place] [address] Phone: [phone] Fax: [phone]     Social Determinants of Health (SDOH) Interventions    Readmission Risk Interventions    11/11/2021    3:49 PM 08/18/2021    3:01 PM  Readmission Risk Prevention Plan  Transportation Screening Complete Complete  Medication Review ([place]) Complete Complete  PCP or Specialist appointment within 3-5 days of discharge Complete Complete  HRI or [place] Complete Complete  SW Recovery Care/Counseling Consult Complete Complete  Palliative Care Screening Not Applicable Not [place] Complete Complete   Emeterio Reeve, [place] Clinical Social Worker

## 2022-05-08 NOTE — Progress Notes (Signed)
PT Cancellation Note  Patient Details Name: [name] MRN: [id number] DOB: [birth year]   Cancelled Treatment:    Reason Eval/Treat Not Completed: Patient declined, no reason specified. Patient lethargic, does not open eyes. States she does not want to mobilize at this time. Will re-attempt later as time allows.    Sharalee Witman 05/08/2022, 12:24 PM

## 2022-05-08 NOTE — Progress Notes (Signed)
   05/07/22 2126  Vitals  Temp 97.6 F (36.4 C)  BP (!) 89/73  MAP (mmHg) 80  BP Location Left Leg  BP Method Automatic  Patient Position (if appropriate) Lying  Pulse Rate 77  Resp 17  MEWS COLOR  MEWS Score Color Green  Oxygen Therapy  SpO2 96 %  O2 Device Room Air  MEWS Score  MEWS Temp 0  MEWS Systolic 1  MEWS Pulse 0  MEWS RR 0  MEWS LOC 0  MEWS Score 1  Provider Notification  Provider Name/Title Jamison Neighbor  Date Provider Notified 05/07/22  Time Provider Notified 2230  Method of Notification  (secure chat)  Notification Reason Other (Comment) (low BP)  Provider response No new orders

## 2022-05-09 DIAGNOSIS — A0472 Enterocolitis due to Clostridium difficile, not specified as recurrent: Secondary | ICD-10-CM | POA: Diagnosis not present

## 2022-05-09 LAB — BASIC METABOLIC PANEL
Anion gap: 13 (ref 5–15)
BUN: 6 mg/dL — ABNORMAL LOW (ref 8–23)
CO2: 26 mmol/L (ref 22–32)
Calcium: 9.3 mg/dL (ref 8.9–10.3)
Chloride: 96 mmol/L — ABNORMAL LOW (ref 98–111)
Creatinine, Ser: 2.22 mg/dL — ABNORMAL HIGH (ref 0.44–1.00)
GFR, Estimated: 23 mL/min — ABNORMAL LOW (ref >60)
Glucose, Bld: 103 mg/dL — ABNORMAL HIGH (ref 70–99)
Potassium: 2.9 mmol/L — ABNORMAL LOW (ref 3.5–5.1)
Sodium: 135 mmol/L (ref 135–145)

## 2022-05-09 LAB — CBC WITH DIFFERENTIAL/PLATELET
Abs Immature Granulocytes: 0.06 10*3/uL (ref 0.00–0.07)
Basophils Absolute: 0.1 10*3/uL (ref 0.0–0.1)
Basophils Relative: 1 %
Eosinophils Absolute: 0.4 10*3/uL (ref 0.0–0.5)
Eosinophils Relative: 4 %
HCT: 29.9 % — ABNORMAL LOW (ref 36.0–46.0)
Hemoglobin: 10.2 g/dL — ABNORMAL LOW (ref 12.0–15.0)
Immature Granulocytes: 1 %
Lymphocytes Relative: 17 %
Lymphs Abs: 1.6 10*3/uL (ref 0.7–4.0)
MCH: 32.9 pg (ref 26.0–34.0)
MCHC: 34.1 g/dL (ref 30.0–36.0)
MCV: 96.5 fL (ref 80.0–100.0)
Monocytes Absolute: 1.8 10*3/uL — ABNORMAL HIGH (ref 0.1–1.0)
Monocytes Relative: 20 %
Neutro Abs: 5.2 10*3/uL (ref 1.7–7.7)
Neutrophils Relative %: 57 %
Platelets: 217 10*3/uL (ref 150–400)
RBC: 3.1 MIL/uL — ABNORMAL LOW (ref 3.87–5.11)
RDW: 18.3 % — ABNORMAL HIGH (ref 11.5–15.5)
WBC: 9.1 10*3/uL (ref 4.0–10.5)
nRBC: 0.2 % (ref 0.0–0.2)

## 2022-05-09 LAB — MAGNESIUM: Magnesium: 2.1 mg/dL (ref 1.7–2.4)

## 2022-05-09 MED ORDER — POTASSIUM CHLORIDE 10 MEQ/100ML IV SOLN
10.0000 meq | INTRAVENOUS | Status: AC
  Administered 2022-05-09 (×2): 10 meq via INTRAVENOUS
  Filled 2022-05-09 (×2): qty 100

## 2022-05-09 MED ORDER — GERHARDT'S BUTT CREAM
TOPICAL_CREAM | Freq: Two times a day (BID) | CUTANEOUS | Status: DC
  Filled 2022-05-09: qty 1

## 2022-05-09 MED ORDER — POTASSIUM CHLORIDE 20 MEQ PO PACK
40.0000 meq | PACK | Freq: Once | ORAL | Status: AC
  Administered 2022-05-09: 40 meq via ORAL
  Filled 2022-05-09: qty 2

## 2022-05-09 NOTE — Progress Notes (Signed)
Progress Note    [name]   QJF:[id number]  DOB: 1951-02-22  DOA: 05/06/2022     3 PCP: Kathryn Carol, MD  Initial CC: SOB, CP  Hospital Course: [name] is a 71 yo female with PMH dCHF, ESRD on HD, CVA, CAD, HLD, HTN, GERD who presented with shortness of breath.  She was recently hospitalized from 04/26/2022 until 05/04/2022 with septic shock due to staph bacteremia from infected AV graft.  She was discharged on Ancef to complete course followed by chronic oral suppression. On admission she was found to be somnolent, hypoxic, and volume overloaded. Further work-up revealed elevated troponin which further uptrended consistent with NSTEMI.  She was evaluated by cardiology and also started on a heparin drip on admission.  She was not considered a good interventional candidate.  Interval History:  No events overnight.  Still large amount of diarrhea and rectal tube to be placed today. Until diarrhea improves, she will remain hospitalized as high risk for readmission otherwise.  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 Plan for Ancef post HD MWF until 6/13 then oral doxy 100 mg BID (due to CDI while on Ancef) - continue Ancef  - appreciate ID evaluation after re-admission  -Follow-up repeat blood cultures obtained on 05/06/2022: NGTD x 3 days  NSTEMI (non-ST elevated myocardial infarction) ([place]) - SOB and CP on admission; trop elevated with peak at 1,008 - s/p 48 hr of heparin drip completed - continue statin and BB - per cardiology, not currently an interventional candidate due to multiple medical issues currently  -Plavix monotherapy okay per cardiology  C. difficile diarrhea - s/p recent diarrhea; C diff Ag positive, toxin negative, PCR positive - started on oral Vanc - per ID continue oral vancomycin 125 mg QID x 10 days then 125 mg BID for 7 days after completion of IV abx -  still large amount of diarrhea; rectal tube to be placed 5/27; awaiting improvement in diarrhea before being considered stable to discharge  End-stage renal disease on hemodialysis ([place]) - Continue chronic midodrine.  BP stable - Nephrology following, continue inpatient dialysis per nephrology  Physical deconditioning - Planning for discharge back to [facility] at discharge.  Will need insurance auth  Acute on chronic diastolic (congestive) heart failure (HCC) - Volume removal planned with HD - CXR on admission consistent with volume overload   Old records reviewed in assessment of this patient  Antimicrobials: Ancef >> current  Oral vancomycin 05/07/2022 >> current  DVT prophylaxis:  heparin injection 5,000 Units Start: 05/09/22 0600   Code Status:   Code Status: DNR  Disposition Plan:  SNF Status is: Inpt  Objective: Blood pressure (!) 132/52, pulse 95, temperature 98.1 F (36.7 C), temperature source Oral, resp. rate 18, height '5\' 2"'$  (1.575 m), weight 50.7 kg, SpO2 96 %.  Examination:  Physical Exam Constitutional:      General: She is not in acute distress. HENT:     Head: Normocephalic and atraumatic.     Mouth/Throat:     Mouth: Mucous membranes are moist.  Eyes:     Extraocular Movements: Extraocular movements intact.  Cardiovascular:     Rate and Rhythm: Normal rate and regular rhythm.  Pulmonary:     Effort: Pulmonary effort is normal.     Breath sounds: Normal breath sounds.  Abdominal:     General: Bowel sounds are normal. There is no distension.  Palpations: Abdomen is soft.     Tenderness: There is no abdominal tenderness.  Musculoskeletal:        General: Normal range of motion.     Cervical back: Normal range of motion and neck supple.  Skin:    General: Skin is warm and dry.  Neurological:     General: No focal deficit present.     Mental Status: She is alert.  Psychiatric:        Mood and Affect: Mood normal.     Consultants:   ID Nephrology Cardiology  Procedures:    Data Reviewed: Results for orders placed or performed during the hospital encounter of 05/06/22 (from the past 24 hour(s))  Renal function panel     Status: Abnormal   Collection Time: 05/08/22  5:00 PM  Result Value Ref Range   Sodium 132 (L) 135 - 145 mmol/L   Potassium 4.1 3.5 - 5.1 mmol/L   Chloride 95 (L) 98 - 111 mmol/L   CO2 20 (L) 22 - 32 mmol/L   Glucose, Bld 75 70 - 99 mg/dL   BUN 28 (H) 8 - 23 mg/dL   Creatinine, Ser 4.54 (H) 0.44 - 1.00 mg/dL   Calcium 10.1 8.9 - 10.3 mg/dL   Phosphorus 3.7 2.5 - 4.6 mg/dL   Albumin 2.8 (L) 3.5 - 5.0 g/dL   GFR, Estimated 10 (L) >60 mL/min   Anion gap 17 (H) 5 - 15  Basic metabolic panel     Status: Abnormal   Collection Time: 05/09/22 12:46 AM  Result Value Ref Range   Sodium 135 135 - 145 mmol/L   Potassium 2.9 (L) 3.5 - 5.1 mmol/L   Chloride 96 (L) 98 - 111 mmol/L   CO2 26 22 - 32 mmol/L   Glucose, Bld 103 (H) 70 - 99 mg/dL   BUN 6 (L) 8 - 23 mg/dL   Creatinine, Ser 2.22 (H) 0.44 - 1.00 mg/dL   Calcium 9.3 8.9 - 10.3 mg/dL   GFR, Estimated 23 (L) >60 mL/min   Anion gap 13 5 - 15  CBC with Differential/Platelet     Status: Abnormal   Collection Time: 05/09/22 12:46 AM  Result Value Ref Range   WBC 9.1 4.0 - 10.5 K/uL   RBC 3.10 (L) 3.87 - 5.11 MIL/uL   Hemoglobin 10.2 (L) 12.0 - 15.0 g/dL   HCT 29.9 (L) 36.0 - 46.0 %   MCV 96.5 80.0 - 100.0 fL   MCH 32.9 26.0 - 34.0 pg   MCHC 34.1 30.0 - 36.0 g/dL   RDW 18.3 (H) 11.5 - 15.5 %   Platelets 217 150 - 400 K/uL   nRBC 0.2 0.0 - 0.2 %   Neutrophils Relative % 57 %   Neutro Abs 5.2 1.7 - 7.7 K/uL   Lymphocytes Relative 17 %   Lymphs Abs 1.6 0.7 - 4.0 K/uL   Monocytes Relative 20 %   Monocytes Absolute 1.8 (H) 0.1 - 1.0 K/uL   Eosinophils Relative 4 %   Eosinophils Absolute 0.4 0.0 - 0.5 K/uL   Basophils Relative 1 %   Basophils Absolute 0.1 0.0 - 0.1 K/uL   Immature Granulocytes 1 %   Abs Immature Granulocytes 0.06 0.00 -  0.07 K/uL  Magnesium     Status: None   Collection Time: 05/09/22 12:46 AM  Result Value Ref Range   Magnesium 2.1 1.7 - 2.4 mg/dL    I have Reviewed nursing notes, Vitals, and Lab results since pt's last  encounter. Pertinent lab results : see above I have ordered test including BMP, CBC, Mg I have reviewed the last note from staff over past 24 hours I have discussed pt's care plan and test results with nursing staff, case manager   LOS: 3 days   Dwyane Dee, MD [facility] 05/09/2022, 3:07 PM

## 2022-05-09 NOTE — Progress Notes (Signed)
[facility] Progress Note   Subjective: Seen in room. C/O nausea, says she can't eat. Still having frequent liquid stools. On oral vanc for C Diff. Use AVG successfully in HD after much encouragement. Net UF 1661 in HD yesterday. Still very much above OP EDW.   Objective Vitals:   05/08/22 2100 05/09/22 0416 05/09/22 0939 05/09/22 0943  BP: (!) 105/44 112/60 (!) 115/46 (!) 132/52  Pulse: 82 81 (!) 120 95  Resp: '16 16 16 18  '$ Temp: 97.6 F (36.4 C) 98.1 F (36.7 C) 98.1 F (36.7 C)   TempSrc: Oral Oral Oral   SpO2:  (!) 89% (!) 78% 96%  Weight:      Height:       Physical Exam General: Frail, chronically ill appearing older female in NAD Heart: W9.N9 1/6 systolic M. No JVD Lungs: CTAB Abdomen: flat, NABS Extremities: No LE edema.  Dialysis Access: R AVG + T/B, large drsg covering. Scabs over lower portion of AVG. Leave open to air to facilitate healing.  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   Liver Function Tests: Recent Labs  Lab 05/03/22 2035 05/06/22 0700 05/08/22 1700  AST  --  31  --   ALT  --  5  --   ALKPHOS  --  105  --   BILITOT  --  1.0  --   PROT  --  7.3  --   ALBUMIN 2.5* 3.2* 2.8*   No results for input(s): LIPASE, AMYLASE in the last 168 hours. CBC: Recent Labs  Lab 05/04/22 0443 05/06/22 0700 05/07/22 0059 05/08/22 0340 05/09/22 0046  WBC 8.5 15.4* 9.1 7.6 9.1  NEUTROABS  --  12.6*  --  3.3 5.2  HGB 8.9* 9.2* 9.1* 9.2* 10.2*  HCT 26.3* 28.9* 27.1* 27.7* 29.9*  MCV  95.3 101.8* 98.2 98.2 96.5  PLT 218 217 211 216 217   Blood Culture    Component Value Date/Time   SDES BLOOD SITE NOT SPECIFIED 05/06/2022 1005   SPECREQUEST  05/06/2022 1005    BOTTLES DRAWN AEROBIC AND ANAEROBIC Blood Culture adequate volume   CULT  05/06/2022 1005    NO GROWTH 3 DAYS Performed at [facility], [place] [address] [place], [place] [address]    REPTSTATUS PENDING 05/06/2022 1005    Cardiac Enzymes: No results for input(s): CKTOTAL, CKMB, CKMBINDEX, TROPONINI in the last 168 hours. CBG: No results for input(s): GLUCAP in the last 168 hours. Iron Studies: No results for input(s): IRON, TIBC, TRANSFERRIN, FERRITIN in the last 72 hours. '@lablastinr3'$ @ Studies/Results: No results found. Medications:   ceFAZolin (ANCEF) IV 2 g (05/08/22 2248)    atorvastatin  80 mg Oral Daily   B-complex with vitamin C  1 tablet  Oral Daily   And   folic acid  1 mg Oral Daily   Chlorhexidine Gluconate Cloth  6 each Topical Q0600   Chlorhexidine Gluconate Cloth  6 each Topical Q0600   cinacalcet  30 mg Oral Q M,W,F-HD   clopidogrel  75 mg Oral Daily   escitalopram  10 mg Oral Daily   famotidine  20 mg Oral Daily   feeding supplement  237 mL Oral BID BM   Gerhardt's butt cream   Topical BID   heparin injection (subcutaneous)  5,000 Units Subcutaneous Q8H   metoprolol tartrate  12.5 mg Oral BID   midodrine  5 mg Oral TID WC   sevelamer carbonate  1,600 mg Oral TID WC   sodium chloride flush  3 mL Intravenous Q12H   [START ON 05/17/2022] vancomycin  125 mg Oral Q12H   vancomycin  125 mg Oral QID     Dialysis Orders: MWF [facility]  4h  350/600  45kg   2/2 bath  P2 R AVG  --Hep none - mircera 50 mcg IV q 4 weeks - hectorol 2 ug IV tiw - sensipar 30 tiw po    Assessment/Plan: Acute hypoxic respiratory failure: CXR with likely pulmonary edema. HD Wednesday with net UF 2L.  Breathing improved. Currently on RA.  Leukocytosis/lactic acidosis: Improved. BC with NGTD. C Diff + -  per PMD NSTEMI - troponin elevated. cardiology following. No CP this AM.  Recent MSSA sepsis/ AVG infection -plan was to continue IV cefazolin until 6/13 with plans for oral cephalosporin thereafter for suppression ESRD - on HD MWF.  AVG required resection during last admit but still functional. Next HD 05/11/2022.  BP/ volume - has had problems with low blood pressures using midodrine.  Does not appear volume overloaded but if weights correct not close to EDW. Net UF 1661 05/26, post wt 50.7 kg. Continue to lower volume as tolerated.  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  Follow HGB.  BMD ckd - C Ca 10.3 today.  Hypercalcemia on admit, will use low Ca bath. Po4 at goal. Hold hectorol. Continue home sensipar Nutrition - renal diet w/fluid restrictions. Alb 3.2 - continue supplements.  Yetunde Leis H. Ely Spragg NP-C 05/09/2022, 10:29 AM  [employer] [phone]

## 2022-05-10 DIAGNOSIS — T827XXD Infection and inflammatory reaction due to other cardiac and vascular devices, implants and grafts, subsequent encounter: Secondary | ICD-10-CM | POA: Diagnosis not present

## 2022-05-10 DIAGNOSIS — L309 Dermatitis, unspecified: Secondary | ICD-10-CM | POA: Diagnosis not present

## 2022-05-10 DIAGNOSIS — A0472 Enterocolitis due to Clostridium difficile, not specified as recurrent: Secondary | ICD-10-CM | POA: Diagnosis not present

## 2022-05-10 LAB — CBC WITH DIFFERENTIAL/PLATELET
Abs Immature Granulocytes: 0.04 10*3/uL (ref 0.00–0.07)
Basophils Absolute: 0.1 10*3/uL (ref 0.0–0.1)
Basophils Relative: 1 %
Eosinophils Absolute: 0.4 10*3/uL (ref 0.0–0.5)
Eosinophils Relative: 4 %
HCT: 31.5 % — ABNORMAL LOW (ref 36.0–46.0)
Hemoglobin: 10.2 g/dL — ABNORMAL LOW (ref 12.0–15.0)
Immature Granulocytes: 0 %
Lymphocytes Relative: 21 %
Lymphs Abs: 2 10*3/uL (ref 0.7–4.0)
MCH: 32.8 pg (ref 26.0–34.0)
MCHC: 32.4 g/dL (ref 30.0–36.0)
MCV: 101.3 fL — ABNORMAL HIGH (ref 80.0–100.0)
Monocytes Absolute: 1.7 10*3/uL — ABNORMAL HIGH (ref 0.1–1.0)
Monocytes Relative: 18 %
Neutro Abs: 5.4 10*3/uL (ref 1.7–7.7)
Neutrophils Relative %: 56 %
Platelets: 242 10*3/uL (ref 150–400)
RBC: 3.11 MIL/uL — ABNORMAL LOW (ref 3.87–5.11)
RDW: 19 % — ABNORMAL HIGH (ref 11.5–15.5)
WBC: 9.7 10*3/uL (ref 4.0–10.5)
nRBC: 0 % (ref 0.0–0.2)

## 2022-05-10 LAB — MAGNESIUM: Magnesium: 2.5 mg/dL — ABNORMAL HIGH (ref 1.7–2.4)

## 2022-05-10 LAB — BASIC METABOLIC PANEL
Anion gap: 13 (ref 5–15)
BUN: 15 mg/dL (ref 8–23)
CO2: 21 mmol/L — ABNORMAL LOW (ref 22–32)
Calcium: 10.1 mg/dL (ref 8.9–10.3)
Chloride: 101 mmol/L (ref 98–111)
Creatinine, Ser: 3.94 mg/dL — ABNORMAL HIGH (ref 0.44–1.00)
GFR, Estimated: 12 mL/min — ABNORMAL LOW (ref >60)
Glucose, Bld: 99 mg/dL (ref 70–99)
Potassium: 4.6 mmol/L (ref 3.5–5.1)
Sodium: 135 mmol/L (ref 135–145)

## 2022-05-10 MED ORDER — ZINC OXIDE 40 % EX OINT
TOPICAL_OINTMENT | CUTANEOUS | Status: AC
  Administered 2022-05-11 – 2022-05-20 (×4): 1 via TOPICAL
  Filled 2022-05-10 (×6): qty 57

## 2022-05-10 MED ORDER — SODIUM CHLORIDE 0.9 % IV BOLUS
500.0000 mL | Freq: Once | INTRAVENOUS | Status: AC
  Administered 2022-05-10: 500 mL via INTRAVENOUS

## 2022-05-10 MED ORDER — ZINC OXIDE 40 % EX OINT
TOPICAL_OINTMENT | CUTANEOUS | Status: DC | PRN
  Filled 2022-05-10: qty 57

## 2022-05-10 NOTE — Consult Note (Signed)
[facility] Nurse Consult Note: Reason for Consult:Patient with irritant contact dermatitis due to frequent loose stool (C. Diff diarrhea). Flexiseal has been used.  Wound type:irritant contact dermatitis  ICD-10 CM Codes for Irritant Dermatitis  L24A2 - Due to fecal, urinary or dual incontinence  Pressure Injury POA: N/A Measurement:diffuse area at buttocks, medial and lateral thighs, perineal area Wound ZOX:WRUE, moist, painful Drainage (amount, consistency, odor) small serous Periwound: intact Dressing procedure/placement/frequency: I will discontinue the Gerhart's Butt Cream in favor of Desitin ointment during this acute phase. If the stooling ceases, the Gerhart's Butt Cream is an excellent recovery preparation. Also I will add an air mattress (mattress replacement with low air loss feature) and pressure redistribution heel boots for her use in bed. A pressure redistribution chair cushion will be used while OOB in chair. Turning and repositioning is already in place, but time in the supine position is to be minimized.   The thorough assessment and implementation of this POC today by Bedside RN M. Royce Macadamia is appreciated.   [place] nursing team will not follow, but will remain available to this patient, the nursing and medical teams.  Please re-consult if needed. Thanks, Maudie Flakes, MSN, RN, [place], Arther Abbott  Pager# [phone]

## 2022-05-10 NOTE — Progress Notes (Signed)
Progress Note    [name]   YCX:[id number]  DOB: 1951/01/01  DOA: 05/06/2022     4 PCP: Kathryn Carol, MD  Initial CC: SOB, CP  Hospital Course: [name] is a 71 yo female with PMH dCHF, ESRD on HD, CVA, CAD, HLD, HTN, GERD who presented with shortness of breath.  She was recently hospitalized from 04/26/2022 until 05/04/2022 with septic shock due to staph bacteremia from infected AV graft.  She was discharged on Ancef to complete course followed by chronic oral suppression. On admission she was found to be somnolent, hypoxic, and volume overloaded. Further work-up revealed elevated troponin which further uptrended consistent with NSTEMI.  She was evaluated by cardiology and also started on a heparin drip on admission.  She was not considered a good interventional candidate.  Interval History:  No events overnight.  Continues to have irritation on buttocks from ongoing diarrhea.   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 Plan for Ancef post HD MWF until 6/13 then oral doxy 100 mg BID (due to CDI while on Ancef) - continue Ancef  - appreciate ID evaluation after re-admission  -Follow-up repeat blood cultures obtained on 05/06/2022: NGTD x 3 days  NSTEMI (non-ST elevated myocardial infarction) ([place]) - SOB and CP on admission; trop elevated with peak at 1,008 - s/p 48 hr of heparin drip completed - continue statin and BB - per cardiology, not currently an interventional candidate due to multiple medical issues currently  -Plavix monotherapy okay per cardiology  C. difficile diarrhea - s/p recent diarrhea; C diff Ag positive, toxin negative, PCR positive - started on oral Vanc - per ID continue oral vancomycin 125 mg QID x 10 days then 125 mg BID for 7 days after completion of IV abx - still large amount of diarrhea; rectal tube to be placed 5/27; awaiting improvement in diarrhea  before being considered stable to discharge  End-stage renal disease on hemodialysis ([place]) - Continue chronic midodrine.  BP stable - Nephrology following, continue inpatient dialysis per nephrology  Dermatitis - skin breakdown from ongoing diarrhea - WOC consult appreciated - did not respond much to Gerhardt's cream, now on desitin trial today  Physical deconditioning - Planning for discharge back to [facility] at discharge.  Will need insurance auth  Acute on chronic diastolic (congestive) heart failure (HCC) - Volume removal planned with HD - CXR on admission consistent with volume overload   Old records reviewed in assessment of this patient  Antimicrobials: Ancef >> current  Oral vancomycin 05/07/2022 >> current  DVT prophylaxis:  heparin injection 5,000 Units Start: 05/09/22 0600   Code Status:   Code Status: DNR  Disposition Plan:  SNF Status is: Inpt  Objective: Blood pressure (!) 109/50, pulse 77, temperature 98.2 F (36.8 C), temperature source Oral, resp. rate 16, height '5\' 2"'$  (1.575 m), weight 50.7 kg, SpO2 92 %.  Examination:  Physical Exam Constitutional:      General: She is not in acute distress. HENT:     Head: Normocephalic and atraumatic.     Mouth/Throat:     Mouth: Mucous membranes are moist.  Eyes:     Extraocular Movements: Extraocular movements intact.  Cardiovascular:     Rate and Rhythm: Normal rate and regular rhythm.  Pulmonary:     Effort: Pulmonary effort is normal.     Breath sounds: Normal breath sounds.  Abdominal:     General:  Bowel sounds are normal. There is no distension.     Palpations: Abdomen is soft.     Tenderness: There is no abdominal tenderness.  Musculoskeletal:        General: Normal range of motion.     Cervical back: Normal range of motion and neck supple.  Skin:    General: Skin is warm and dry.  Neurological:     General: No focal deficit present.     Mental Status: She is alert.  Psychiatric:         Mood and Affect: Mood normal.     Consultants:  ID Nephrology Cardiology  Procedures:    Data Reviewed: Results for orders placed or performed during the hospital encounter of 05/06/22 (from the past 24 hour(s))  Basic metabolic panel     Status: Abnormal   Collection Time: 05/10/22 12:37 AM  Result Value Ref Range   Sodium 135 135 - 145 mmol/L   Potassium 4.6 3.5 - 5.1 mmol/L   Chloride 101 98 - 111 mmol/L   CO2 21 (L) 22 - 32 mmol/L   Glucose, Bld 99 70 - 99 mg/dL   BUN 15 8 - 23 mg/dL   Creatinine, Ser 3.94 (H) 0.44 - 1.00 mg/dL   Calcium 10.1 8.9 - 10.3 mg/dL   GFR, Estimated 12 (L) >60 mL/min   Anion gap 13 5 - 15  CBC with Differential/Platelet     Status: Abnormal   Collection Time: 05/10/22 12:37 AM  Result Value Ref Range   WBC 9.7 4.0 - 10.5 K/uL   RBC 3.11 (L) 3.87 - 5.11 MIL/uL   Hemoglobin 10.2 (L) 12.0 - 15.0 g/dL   HCT 31.5 (L) 36.0 - 46.0 %   MCV 101.3 (H) 80.0 - 100.0 fL   MCH 32.8 26.0 - 34.0 pg   MCHC 32.4 30.0 - 36.0 g/dL   RDW 19.0 (H) 11.5 - 15.5 %   Platelets 242 150 - 400 K/uL   nRBC 0.0 0.0 - 0.2 %   Neutrophils Relative % 56 %   Neutro Abs 5.4 1.7 - 7.7 K/uL   Lymphocytes Relative 21 %   Lymphs Abs 2.0 0.7 - 4.0 K/uL   Monocytes Relative 18 %   Monocytes Absolute 1.7 (H) 0.1 - 1.0 K/uL   Eosinophils Relative 4 %   Eosinophils Absolute 0.4 0.0 - 0.5 K/uL   Basophils Relative 1 %   Basophils Absolute 0.1 0.0 - 0.1 K/uL   Immature Granulocytes 0 %   Abs Immature Granulocytes 0.04 0.00 - 0.07 K/uL  Magnesium     Status: Abnormal   Collection Time: 05/10/22 12:37 AM  Result Value Ref Range   Magnesium 2.5 (H) 1.7 - 2.4 mg/dL    I have Reviewed nursing notes, Vitals, and Lab results since pt's last encounter. Pertinent lab results : see above I have ordered test including BMP, CBC, Mg I have reviewed the last note from staff over past 24 hours I have discussed pt's care plan and test results with nursing staff, case manager   LOS: 4  days   Dwyane Dee, MD [facility] 05/10/2022, 1:04 PM

## 2022-05-10 NOTE — Assessment & Plan Note (Addendum)
Skin breakdown from ongoing diarrhea.  Diarrhea seems to be resolving. Continue Desitin cream

## 2022-05-10 NOTE — Progress Notes (Signed)
[facility] Progress Note   Subjective: Seen in room. Buttocks excoriated from diarrhea. Says she is breathing OK, hasn't eaten this AM. Order wound care.     Objective Vitals:   05/09/22 1708 05/09/22 2136 05/10/22 0717 05/10/22 0758  BP: (!) 83/43 (!) 84/38 (!) 109/50   Pulse: 76 77 (!) 128 77  Resp: '18 18 16   '$ Temp: 98.3 F (36.8 C) 98.2 F (36.8 C) 98.2 F (36.8 C)   TempSrc: Oral Oral Oral   SpO2: 98% 95% (!) 77% 92%  Weight:      Height:       Physical Exam General: Frail, chronically ill appearing older female in NAD Skin: buttocks excoriated from frequent stools Heart: G4.W1 1/6 systolic M. No JVD Lungs: CTAB Abdomen: flat, NABS Extremities: No LE edema.  Dialysis Access: R AVG + T/B, large drsg covering. Scabs over lower portion of AVG. Leave open to air to facilitate healing.  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   Liver Function Tests: Recent Labs  Lab 05/03/22 2035 05/06/22 0700 05/08/22 1700  AST  --  31  --   ALT  --  5  --   ALKPHOS  --  105  --   BILITOT  --  1.0  --   PROT  --  7.3  --   ALBUMIN 2.5* 3.2* 2.8*   No results for input(s): LIPASE, AMYLASE in the last 168 hours. CBC: Recent Labs  Lab 05/06/22 0700 05/07/22 0059 05/08/22 0340 05/09/22 0046 05/10/22 0037  WBC 15.4* 9.1 7.6 9.1 9.7  NEUTROABS 12.6*  --  3.3 5.2 5.4  HGB 9.2* 9.1* 9.2* 10.2* 10.2*  HCT 28.9* 27.1* 27.7* 29.9* 31.5*  MCV 101.8* 98.2 98.2 96.5 101.3*  PLT 217 211 216 217 242   Blood  Culture    Component Value Date/Time   SDES BLOOD SITE NOT SPECIFIED 05/06/2022 1005   SPECREQUEST  05/06/2022 1005    BOTTLES DRAWN AEROBIC AND ANAEROBIC Blood Culture adequate volume   CULT  05/06/2022 1005    NO GROWTH 3 DAYS Performed at [facility], [place] [address], [place], [place] [address]    REPTSTATUS PENDING 05/06/2022 1005    Cardiac Enzymes: No results for input(s): CKTOTAL, CKMB, CKMBINDEX, TROPONINI in the last 168 hours. CBG: No results for input(s): GLUCAP in the last 168 hours. Iron Studies: No results for input(s): IRON, TIBC, TRANSFERRIN, FERRITIN in the last 72 hours. '@lablastinr3'$ @ Studies/Results: No results found. Medications:   ceFAZolin (ANCEF) IV 2 g (05/08/22 2248)    atorvastatin  80 mg Oral Daily   B-complex with vitamin C  1 tablet Oral Daily   And   folic acid  1 mg Oral Daily  Chlorhexidine Gluconate Cloth  6 each Topical Q0600   Chlorhexidine Gluconate Cloth  6 each Topical Q0600   cinacalcet  30 mg Oral Q M,W,F-HD   clopidogrel  75 mg Oral Daily   escitalopram  10 mg Oral Daily   famotidine  20 mg Oral Daily   feeding supplement  237 mL Oral BID BM   Gerhardt's butt cream   Topical BID   heparin injection (subcutaneous)  5,000 Units Subcutaneous Q8H   metoprolol tartrate  12.5 mg Oral BID   midodrine  5 mg Oral TID WC   sevelamer carbonate  1,600 mg Oral TID WC   sodium chloride flush  3 mL Intravenous Q12H   [START ON 05/17/2022] vancomycin  125 mg Oral Q12H   vancomycin  125 mg Oral QID     Dialysis Orders: MWF [facility]  4h  350/600  45kg   2/2 bath  P2 R AVG  --Hep none - mircera 50 mcg IV q 4 weeks - hectorol 2 ug IV tiw - sensipar 30 tiw po    Assessment/Plan: Acute hypoxic respiratory failure: CXR with likely pulmonary edema. HD Wednesday with net UF 2L.  Breathing improved. Currently on RA.  Leukocytosis/lactic acidosis: Improved. BC with NGTD. C Diff + - per PMD NSTEMI - troponin elevated. cardiology following. No  CP this AM.  Recent MSSA sepsis/ AVG infection -plan was to continue IV cefazolin until 6/13 with plans for oral cephalosporin thereafter for suppression ESRD - on HD MWF.  AVG required resection during last admit but still functional. Next HD 05/11/2022.  BP/ volume - has had problems with low blood pressures using midodrine.  Does not appear volume overloaded but if weights correct not close to EDW. Net UF 1661 05/26, post wt 50.7 kg. Continue to lower volume as tolerated.  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  Follow HGB.  BMD ckd - C Ca 10.3 today.  Hypercalcemia on admit, will use low Ca bath. Po4 at goal. Hold hectorol. Continue home sensipar Nutrition - renal diet w/fluid restrictions. Alb 3.2 - continue supplements Excoriation of buttocks: Looks extremely painful Wound care consult  [place] H. Makyi Ledo NP-C 05/10/2022, 9:22 AM  [employer] [phone]

## 2022-05-11 DIAGNOSIS — N186 End stage renal disease: Secondary | ICD-10-CM | POA: Diagnosis not present

## 2022-05-11 DIAGNOSIS — Z992 Dependence on renal dialysis: Secondary | ICD-10-CM | POA: Diagnosis not present

## 2022-05-11 DIAGNOSIS — A0472 Enterocolitis due to Clostridium difficile, not specified as recurrent: Secondary | ICD-10-CM | POA: Diagnosis not present

## 2022-05-11 LAB — CBC WITH DIFFERENTIAL/PLATELET
Abs Immature Granulocytes: 0.04 10*3/uL (ref 0.00–0.07)
Basophils Absolute: 0.1 10*3/uL (ref 0.0–0.1)
Basophils Relative: 1 %
Eosinophils Absolute: 0.5 10*3/uL (ref 0.0–0.5)
Eosinophils Relative: 5 %
HCT: 32.2 % — ABNORMAL LOW (ref 36.0–46.0)
Hemoglobin: 10.1 g/dL — ABNORMAL LOW (ref 12.0–15.0)
Immature Granulocytes: 0 %
Lymphocytes Relative: 25 %
Lymphs Abs: 2.6 10*3/uL (ref 0.7–4.0)
MCH: 32.3 pg (ref 26.0–34.0)
MCHC: 31.4 g/dL (ref 30.0–36.0)
MCV: 102.9 fL — ABNORMAL HIGH (ref 80.0–100.0)
Monocytes Absolute: 1.8 10*3/uL — ABNORMAL HIGH (ref 0.1–1.0)
Monocytes Relative: 17 %
Neutro Abs: 5.2 10*3/uL (ref 1.7–7.7)
Neutrophils Relative %: 52 %
Platelets: 251 10*3/uL (ref 150–400)
RBC: 3.13 MIL/uL — ABNORMAL LOW (ref 3.87–5.11)
RDW: 19.2 % — ABNORMAL HIGH (ref 11.5–15.5)
WBC: 10.2 10*3/uL (ref 4.0–10.5)
nRBC: 0.5 % — ABNORMAL HIGH (ref 0.0–0.2)

## 2022-05-11 LAB — CULTURE, BLOOD (ROUTINE X 2)
Culture: NO GROWTH
Culture: NO GROWTH
Special Requests: ADEQUATE

## 2022-05-11 LAB — BASIC METABOLIC PANEL
Anion gap: 12 (ref 5–15)
BUN: 24 mg/dL — ABNORMAL HIGH (ref 8–23)
CO2: 21 mmol/L — ABNORMAL LOW (ref 22–32)
Calcium: 10.1 mg/dL (ref 8.9–10.3)
Chloride: 102 mmol/L (ref 98–111)
Creatinine, Ser: 5.45 mg/dL — ABNORMAL HIGH (ref 0.44–1.00)
GFR, Estimated: 8 mL/min — ABNORMAL LOW (ref >60)
Glucose, Bld: 117 mg/dL — ABNORMAL HIGH (ref 70–99)
Potassium: 4.6 mmol/L (ref 3.5–5.1)
Sodium: 135 mmol/L (ref 135–145)

## 2022-05-11 LAB — MAGNESIUM: Magnesium: 2.5 mg/dL — ABNORMAL HIGH (ref 1.7–2.4)

## 2022-05-11 IMAGING — DX DG CHEST 2V
2 series · 2 of 2 positions shown · non-contrast
Comparison: 08/06/2021

CLINICAL DATA: Short of breath and fatigue

EXAM:
CHEST - 2 VIEW

[x chest ap]
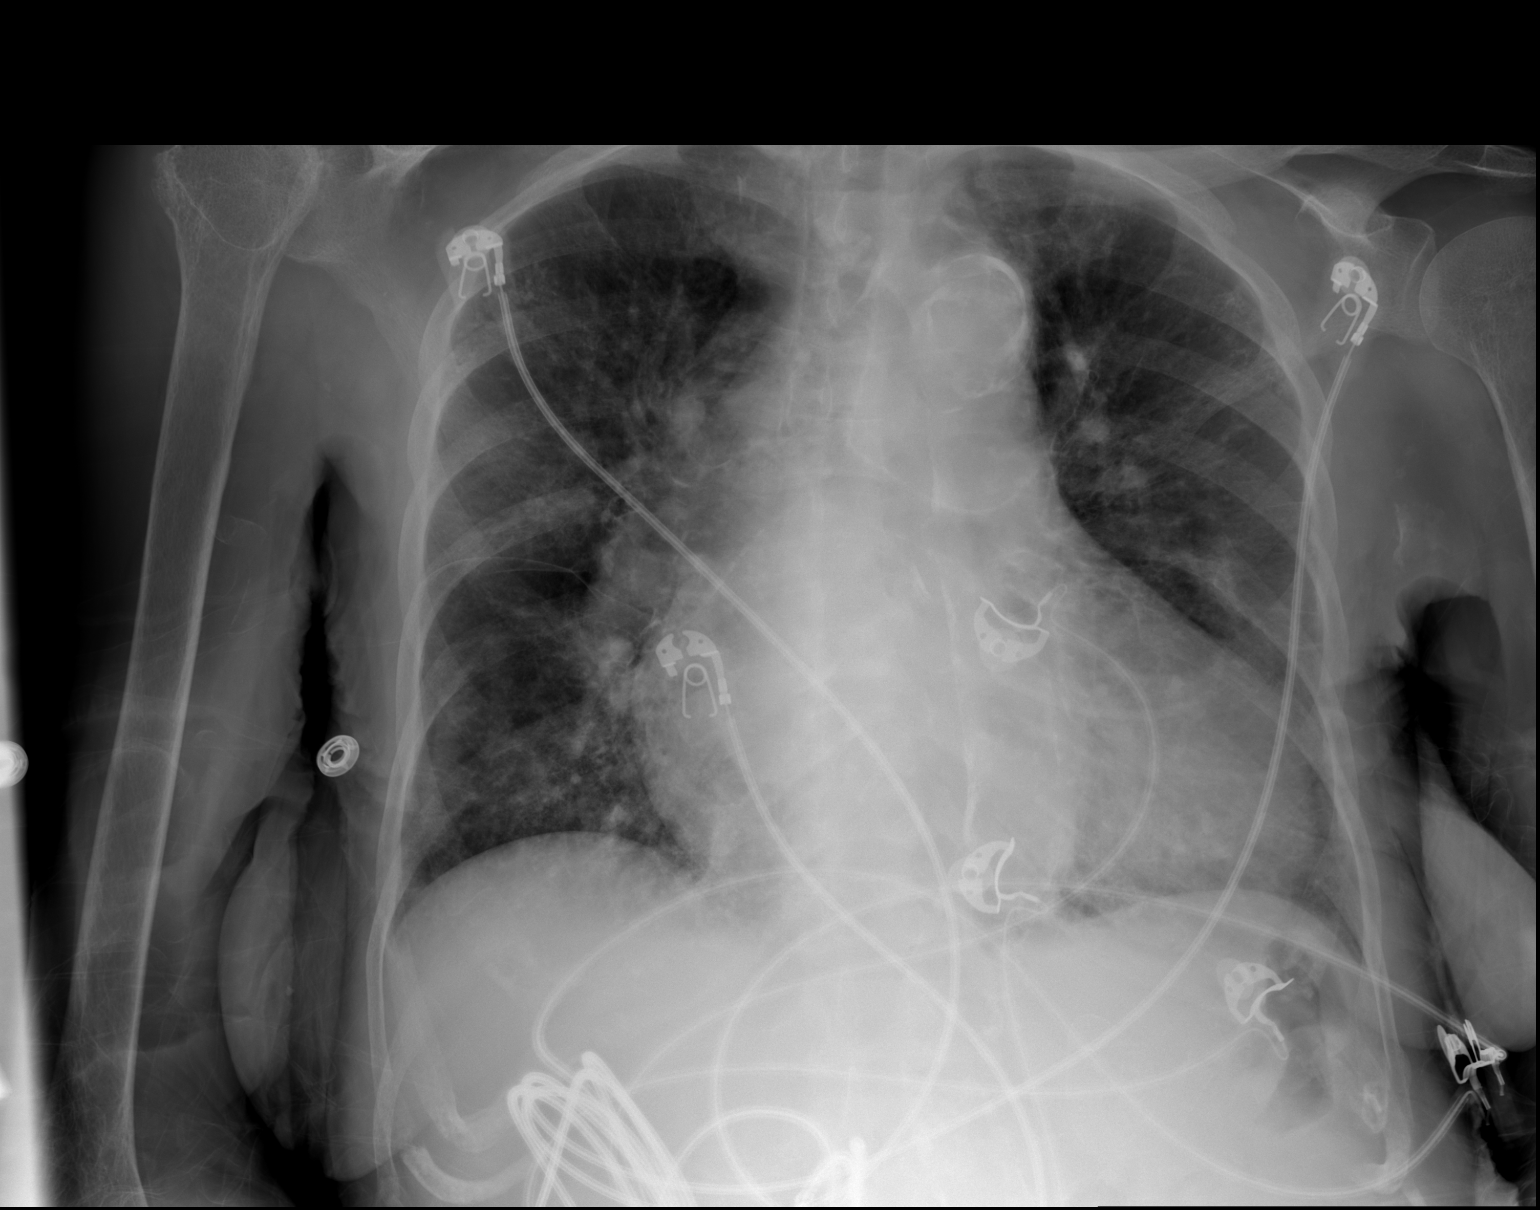

[w chest lat]
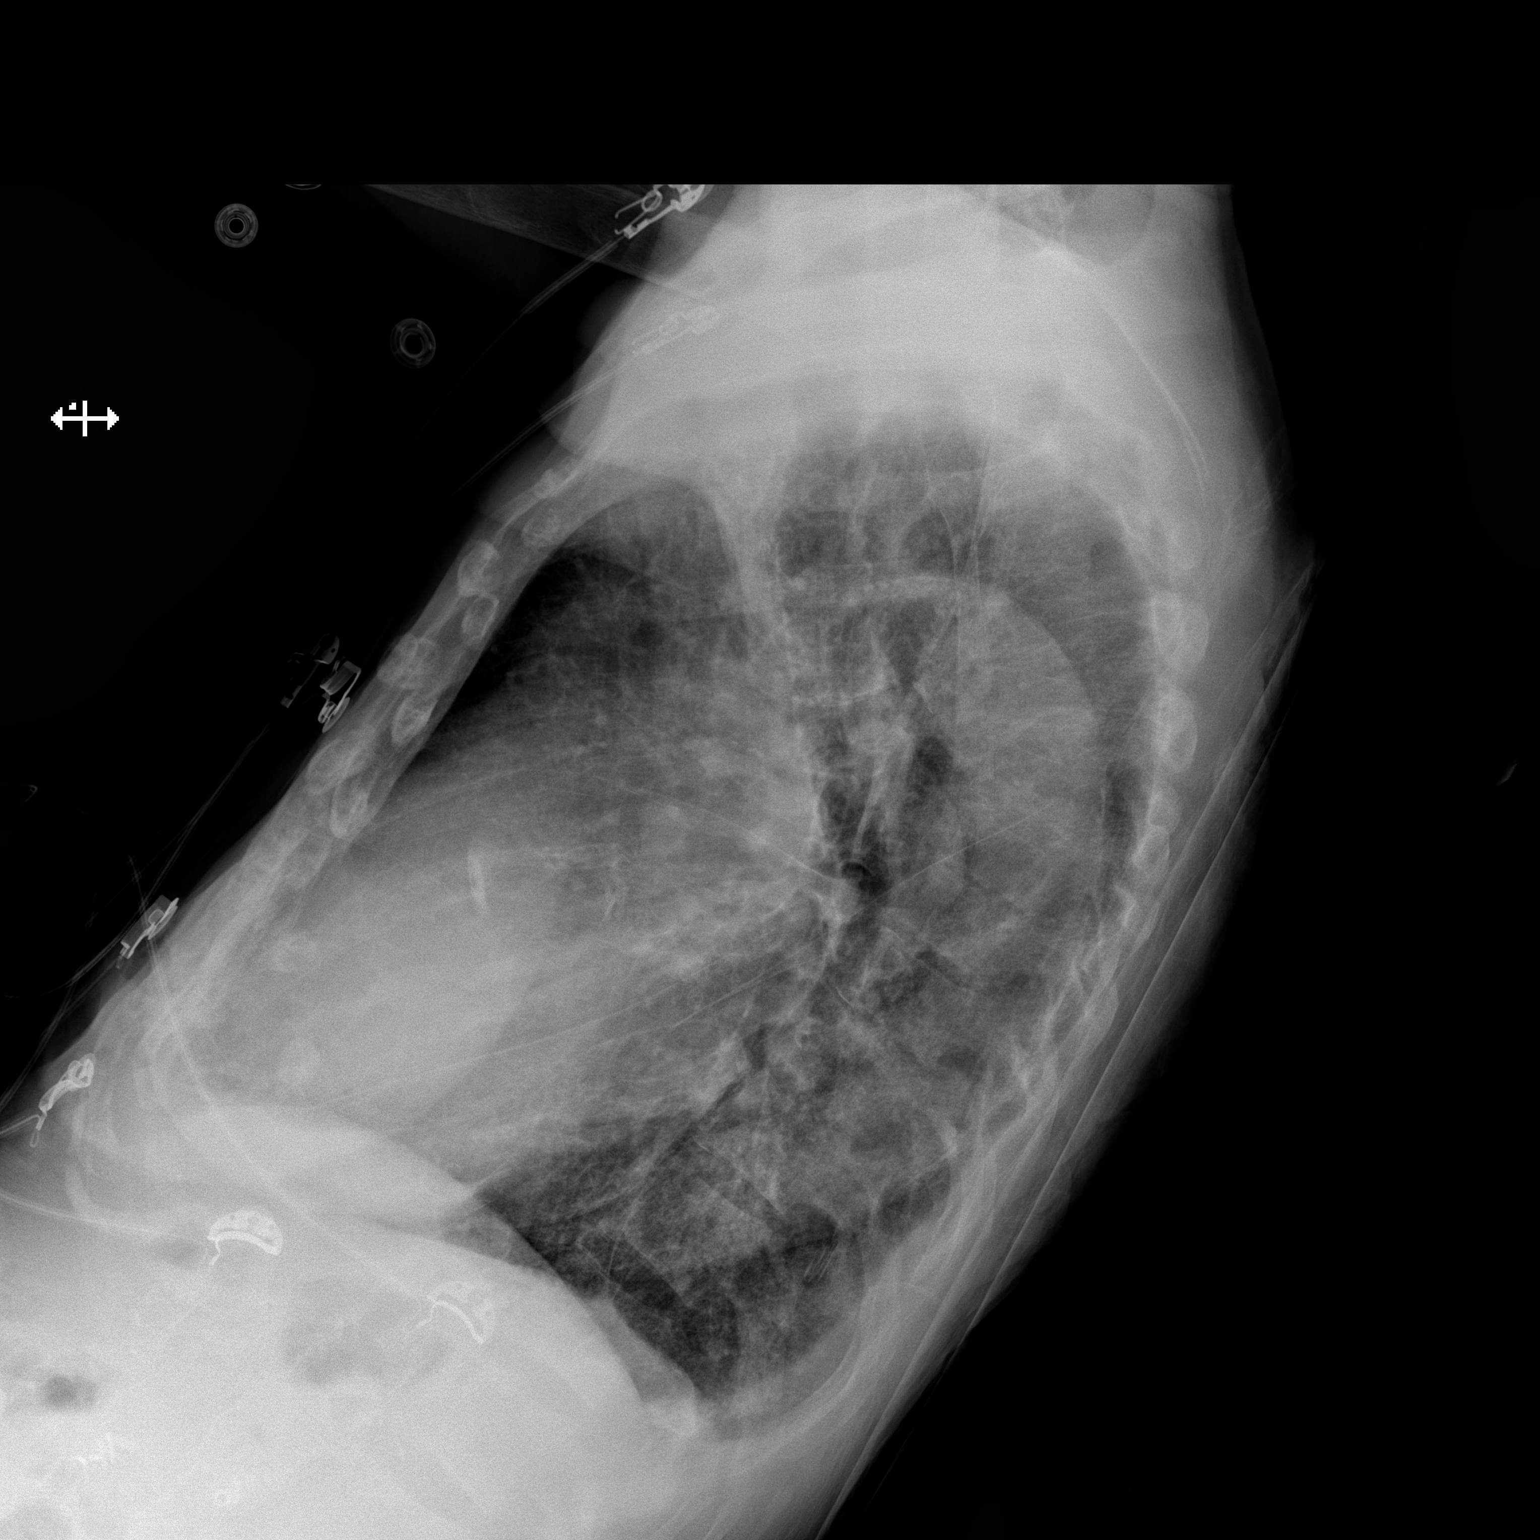

[2 of 2 positions shown; findings below may reference images not displayed]

FINDINGS: Cardiac enlargement with mild vascular congestion. Negative for
heart failure or edema. Atherosclerotic aortic arch. Negative for
infiltrate or effusion.
IMPRESSION: Cardiac enlargement with mild vascular congestion. Negative for
edema.

## 2022-05-11 MED ORDER — SODIUM CHLORIDE 0.9 % IV SOLN: 100.0000 mL | INTRAVENOUS | Status: DC | PRN

## 2022-05-11 MED ORDER — PENTAFLUOROPROP-TETRAFLUOROETH EX AERO: 1.0000 "application " | INHALATION_SPRAY | CUTANEOUS | Status: DC | PRN

## 2022-05-11 MED ORDER — LIDOCAINE-PRILOCAINE 2.5-2.5 % EX CREA: 1.0000 "application " | TOPICAL_CREAM | CUTANEOUS | Status: DC | PRN

## 2022-05-11 MED ORDER — LIDOCAINE HCL (PF) 1 % IJ SOLN: 5.0000 mL | INTRAMUSCULAR | Status: DC | PRN

## 2022-05-11 NOTE — Progress Notes (Addendum)
Subjective: Seen in room complains of diarrhea but aware she has rectal tube, for HD today  Objective Vital signs in last 24 hours: Vitals:   05/10/22 2013 05/10/22 2228 05/11/22 0500 05/11/22 0839  BP: (!) 116/45 112/64 (!) 100/37 103/78  Pulse: 69 69 72 70  Resp: '16  16 17  '$ Temp: 98.3 F (36.8 C)  98.1 F (36.7 C) 98 F (36.7 C)  TempSrc: Oral  Oral Oral  SpO2: 96%  93% 95%  Weight:   54.4 kg   Height:       Weight change:   Physical Exam: General: Thin, frail chronically ill elderly female NAD Heart: RRR, 1/6 SEM no rub Lungs: CTA bilaterally anteriorly nonlabored breathing Abdomen: NABS, soft, ND NT has rectal tube Extremities: No pedal edema Dialysis Access: Right upper arm AV GG positive bruit, scabbing healing wound lower portion, old left upper arm AV fistula with aneurysmal positive bruit   Dialysis Orders: MWF [facility]  4h  350/600  45kg   2/2 bath  P2 R AVG  --Hep none - mircera 50 mcg IV q 4 weeks - hectorol 2 ug IV tiw - sensipar 30 tiw po   Problem/Plan:  Acute hypoxic respiratory failure: CXR CW CHF , status post HD symptomatic improving  C Diff + - per PMD NSTEMI - troponin elevated. cardiology following. per cardiology not currently interventional candidate due to multiple medical issues/Plavix monotherapy okay No CP this AM.  Recent MSSA sepsis/ AVG infection -plan continue IV cefazolin until 6/13 with plans for oral Doxy 100 mg twice daily (due to CDI while on Ancef )thereafter for suppression/ ID reeval noted per admit ESRD - on HD MWF.  AVG required resection during last admit but still functional. Next HD today 05/11/2022.  BP/ volume -currently does not appear volume overloaded, has had problems with low blood pressures using midodrine.   wts Questionable  because of bed weights /if weights correct not close to EDW. Net UF 1661 05/26, post wt 50.7 kg. Continue to lower volume as tolerated.  Anemia esrd -Hgb 10.1 has received ESA recently.  Follow HGB.   BMD ckd - C Ca 11 today.  Hypercalcemia on admit, will use low Ca bath.  Last po4 at goal. Hold hectorol. Continue home sensipar Nutrition -2.8 ALB renal diet w/fluid restrictions.  continue supplements Excoriation of buttocks: Rx per wound care consult Dementia/debility= at baseline and at [facility] for SNF   Ernest Haber, PA-C [place] [phone] 05/11/2022,11:34 AM  LOS: 5 days   Labs: Basic Metabolic Panel: Recent Labs  Lab 05/08/22 1700 05/09/22 0046 05/10/22 0037 05/11/22 0032  NA 132* 135 135 135  K 4.1 2.9* 4.6 4.6  CL 95* 96* 101 102  CO2 20* 26 21* 21*  GLUCOSE 75 103* 99 117*  BUN 28* 6* 15 24*  CREATININE 4.54* 2.22* 3.94* 5.45*  CALCIUM 10.1 9.3 10.1 10.1  PHOS 3.7  --   --   --    Liver Function Tests: Recent Labs  Lab 05/06/22 0700 05/08/22 1700  AST 31  --   ALT 5  --   ALKPHOS 105  --   BILITOT 1.0  --   PROT 7.3  --   ALBUMIN 3.2* 2.8*   No results for input(s): LIPASE, AMYLASE in the last 168 hours. No results for input(s): AMMONIA in the last 168 hours. CBC: Recent Labs  Lab 05/07/22 0059 05/08/22 0340 05/09/22 0046 05/10/22 0037 05/11/22 0032  WBC 9.1 7.6  9.1 9.7 10.2  NEUTROABS  --  3.3 5.2 5.4 5.2  HGB 9.1* 9.2* 10.2* 10.2* 10.1*  HCT 27.1* 27.7* 29.9* 31.5* 32.2*  MCV 98.2 98.2 96.5 101.3* 102.9*  PLT 211 216 217 242 251   Cardiac Enzymes: No results for input(s): CKTOTAL, CKMB, CKMBINDEX, TROPONINI in the last 168 hours. CBG: No results for input(s): GLUCAP in the last 168 hours.  Studies/Results: No results found. Medications:   ceFAZolin (ANCEF) IV 2 g (05/08/22 2248)    atorvastatin  80 mg Oral Daily   B-complex with vitamin C  1 tablet Oral Daily   And   folic acid  1 mg Oral Daily   Chlorhexidine Gluconate Cloth  6 each Topical Q0600   Chlorhexidine Gluconate Cloth  6 each Topical Q0600   cinacalcet  30 mg Oral Q M,W,F-HD   clopidogrel  75 mg Oral Daily   escitalopram  10 mg Oral  Daily   famotidine  20 mg Oral Daily   feeding supplement  237 mL Oral BID BM   heparin injection (subcutaneous)  5,000 Units Subcutaneous Q8H   liver oil-zinc oxide   Topical Q4H   metoprolol tartrate  12.5 mg Oral BID   midodrine  5 mg Oral TID WC   sevelamer carbonate  1,600 mg Oral TID WC   sodium chloride flush  3 mL Intravenous Q12H   [START ON 05/17/2022] vancomycin  125 mg Oral Q12H   vancomycin  125 mg Oral QID

## 2022-05-11 NOTE — Progress Notes (Signed)
Progress Note    [name]   KGY:[id number]  DOB: Oct 24, 1951  DOA: 05/06/2022     5 PCP: [name], MD  Initial CC: SOB, CP  Hospital Course: [name] is a 71 yo female with PMH dCHF, ESRD on HD, CVA, CAD, HLD, HTN, GERD who presented with shortness of breath.  She was recently hospitalized from 04/26/2022 until 05/04/2022 with septic shock due to staph bacteremia from infected AV graft.  She was discharged on Ancef to complete course followed by chronic oral suppression. On admission she was found to be somnolent, hypoxic, and volume overloaded. Further work-up revealed elevated troponin which further uptrended consistent with NSTEMI.  She was evaluated by cardiology and also started on a heparin drip on admission.  She was not considered a good interventional candidate.  Interval History:  No events overnight.  Still having liquid stool noted in rectal tube.  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 Plan for Ancef post HD MWF until 6/13 then oral doxy 100 mg BID (due to CDI while on Ancef) - continue Ancef  - appreciate ID evaluation after re-admission  -Follow-up repeat blood cultures obtained on 05/06/2022: NGTD x 3 days  NSTEMI (non-ST elevated myocardial infarction) ([place]) - SOB and CP on admission; trop elevated with peak at 1,008 - s/p 48 hr of heparin drip completed - continue statin and BB - per cardiology, not currently an interventional candidate due to multiple medical issues currently  -Plavix monotherapy okay per cardiology  C. difficile diarrhea - s/p recent diarrhea; C diff Ag positive, toxin negative, PCR positive - started on oral Vanc - per ID continue oral vancomycin 125 mg QID x 10 days then 125 mg BID for 7 days after completion of IV abx - still large amount of diarrhea; rectal tube to be placed 5/27; awaiting improvement in diarrhea before being  considered stable to discharge  End-stage renal disease on hemodialysis ([place]) - Continue chronic midodrine.  BP stable - Nephrology following, continue inpatient dialysis per nephrology  Dermatitis - skin breakdown from ongoing diarrhea - WOC consult appreciated - did not respond much to Gerhardt's cream, now on desitin trial today  Physical deconditioning - Planning for discharge back to [facility] at discharge.  Will need insurance auth  Acute on chronic diastolic (congestive) heart failure (HCC) - Volume removal planned with HD - CXR on admission consistent with volume overload   Old records reviewed in assessment of this patient  Antimicrobials: Ancef >> current  Oral vancomycin 05/07/2022 >> current  DVT prophylaxis:  heparin injection 5,000 Units Start: 05/09/22 0600   Code Status:   Code Status: DNR  Disposition Plan:  SNF Status is: Inpt  Objective: Blood pressure 103/78, pulse 70, temperature 98 F (36.7 C), temperature source Oral, resp. rate 17, height '5\' 2"'$  (1.575 m), weight 54.4 kg, SpO2 95 %.  Examination:  Physical Exam Constitutional:      General: She is not in acute distress. HENT:     Head: Normocephalic and atraumatic.     Mouth/Throat:     Mouth: Mucous membranes are moist.  Eyes:     Extraocular Movements: Extraocular movements intact.  Cardiovascular:     Rate and Rhythm: Normal rate and regular rhythm.  Pulmonary:     Effort: Pulmonary effort is normal.     Breath sounds: Normal breath sounds.  Abdominal:     General: Bowel sounds are  normal. There is no distension.     Palpations: Abdomen is soft.     Tenderness: There is no abdominal tenderness.  Musculoskeletal:     Cervical back: Normal range of motion and neck supple.     Comments: Left arm fistula appreciated with good thrill.  Old right fistula noted with dressing in place  Skin:    General: Skin is warm and dry.  Neurological:     General: No focal deficit present.      Mental Status: She is alert.  Psychiatric:        Mood and Affect: Mood normal.     Consultants:  ID Nephrology Cardiology  Procedures:    Data Reviewed: Results for orders placed or performed during the hospital encounter of 05/06/22 (from the past 24 hour(s))  Basic metabolic panel     Status: Abnormal   Collection Time: 05/11/22 12:32 AM  Result Value Ref Range   Sodium 135 135 - 145 mmol/L   Potassium 4.6 3.5 - 5.1 mmol/L   Chloride 102 98 - 111 mmol/L   CO2 21 (L) 22 - 32 mmol/L   Glucose, Bld 117 (H) 70 - 99 mg/dL   BUN 24 (H) 8 - 23 mg/dL   Creatinine, Ser 5.45 (H) 0.44 - 1.00 mg/dL   Calcium 10.1 8.9 - 10.3 mg/dL   GFR, Estimated 8 (L) >60 mL/min   Anion gap 12 5 - 15  CBC with Differential/Platelet     Status: Abnormal   Collection Time: 05/11/22 12:32 AM  Result Value Ref Range   WBC 10.2 4.0 - 10.5 K/uL   RBC 3.13 (L) 3.87 - 5.11 MIL/uL   Hemoglobin 10.1 (L) 12.0 - 15.0 g/dL   HCT 32.2 (L) 36.0 - 46.0 %   MCV 102.9 (H) 80.0 - 100.0 fL   MCH 32.3 26.0 - 34.0 pg   MCHC 31.4 30.0 - 36.0 g/dL   RDW 19.2 (H) 11.5 - 15.5 %   Platelets 251 150 - 400 K/uL   nRBC 0.5 (H) 0.0 - 0.2 %   Neutrophils Relative % 52 %   Neutro Abs 5.2 1.7 - 7.7 K/uL   Lymphocytes Relative 25 %   Lymphs Abs 2.6 0.7 - 4.0 K/uL   Monocytes Relative 17 %   Monocytes Absolute 1.8 (H) 0.1 - 1.0 K/uL   Eosinophils Relative 5 %   Eosinophils Absolute 0.5 0.0 - 0.5 K/uL   Basophils Relative 1 %   Basophils Absolute 0.1 0.0 - 0.1 K/uL   Immature Granulocytes 0 %   Abs Immature Granulocytes 0.04 0.00 - 0.07 K/uL  Magnesium     Status: Abnormal   Collection Time: 05/11/22 12:32 AM  Result Value Ref Range   Magnesium 2.5 (H) 1.7 - 2.4 mg/dL    I have Reviewed nursing notes, Vitals, and Lab results since pt's last encounter. Pertinent lab results : see above I have ordered test including BMP, CBC, Mg I have reviewed the last note from staff over past 24 hours I have discussed pt's care plan  and test results with nursing staff, case manager   LOS: 5 days   Dwyane Dee, MD [facility] 05/11/2022, 12:55 PM

## 2022-05-11 NOTE — Progress Notes (Signed)
removed 864ms net fluid unable to remove more fluid due to hypotension as low as 54 systolic. system clotted with 32 minutes left 125 ml blood loss    pre bp 75/34 post bp 87/54 pre weight 58.06kg post weight 56.5kg.  2 bandages to rua avf no bleeding dressing cdi.  gave ancef and sensipar as ordered.

## 2022-05-11 NOTE — Progress Notes (Signed)
Occupational Therapy Treatment Patient Details Name: [name] MRN: [id number] DOB: Apr 15, 1951 Today's Date: 05/11/2022   History of present illness Pt is a 71 y.o. female admitted 05/06/22 with SOB and infection. 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 S/p RUE AVG excision with revision 5/15. Course complicated by hypotension perioperatively; ETT 5/15-5/16. Workup for septic shock secondary to MRSA bacteremia. PMH includes ESRD (HD MWF), CKD, CHF, HTN, HLD.   OT comments  Patient continues to make incremental progress towards goals in skilled OT session. Patient's session encompassed bed mobility, self feeding, and basic ADL tasks. Patient asleep upon arrival though wanting to eat breakfast, with OT providing frequent repositioning throughout session as patient kept leaning toward L bed rail. OT providing frequent cues to reorient to task as patient would nod off with a bolus in her mouth on multiple instances and min-mod A in order to complete self-feeding (RN informed at end of session that supervision with meals would be beneficial due to safety and risk of aspiration). Patient remains oriented to self and place only, often re-greeting OT when OT would move to R side of the bed to promote better positioning. OT continues to recommend SNF level rehab at discharge. OT will continue to follow acutely.     Recommendations for follow up therapy are one component of a multi-disciplinary discharge planning process, led by the attending physician.  Recommendations may be updated based on patient status, additional functional criteria and insurance authorization.    Follow Up Recommendations  Skilled nursing-short term rehab (<3 hours/day)    Assistance Recommended at Discharge Frequent or constant Supervision/Assistance  Patient can return home with the following  Two people to help with walking and/or transfers;A lot of help with  bathing/dressing/bathroom;Assistance with cooking/housework;Direct supervision/assist for medications management;Direct supervision/assist for financial management;Assist for transportation;Help with stairs or ramp for entrance;Assistance with feeding   Equipment Recommendations  Other (comment) (defer to next venue)    Recommendations for Other Services      Precautions / Restrictions Precautions Precautions: Fall Restrictions Weight Bearing Restrictions: No       Mobility Bed Mobility Overal bed mobility: Needs Assistance Bed Mobility: Rolling Rolling: Mod assist         General bed mobility comments: patient leaning to L side upon entry, frequent need for OT to intervene to reposition when eating to consume safelty, mod A to adjust hips at end of session    Transfers                   General transfer comment: patient declining due to fatigue     Balance     Sitting balance-Leahy Scale: Poor Sitting balance - Comments: poor balance at trunk noted in supported seated position                                   ADL either performed or assessed with clinical judgement   ADL Overall ADL's : Needs assistance/impaired Eating/Feeding: Bed level;Minimal assistance;Moderate assistance Eating/Feeding Details (indicate cue type and reason): assistance and cues needed for safe eating, frequent repositioning to sit upright as patient would often fall to the L side bed rail Grooming: Bed level;Set up;Wash/dry face;Wash/dry [employer], [employer]  mobility during ADLs: Maximal assistance;Cueing for safety;Cueing for sequencing General ADL Comments: Patient making incremental progress, remains lethargic, would arouse with cues and reorient to task    Extremity/Trunk Assessment              Vision       Perception     Praxis      Cognition Arousal/Alertness: Lethargic Behavior During Therapy: WFL for tasks  assessed/performed Overall Cognitive Status: No family/caregiver present to determine baseline cognitive functioning                                 General Comments: patient remains oriented to self and place only, max cues to remain alert to eat breakfast        Exercises      Shoulder Instructions       General Comments      Pertinent Vitals/ Pain       Pain Assessment Pain Assessment: Faces Faces Pain Scale: Hurts a little bit Pain Location: generalized Pain Descriptors / Indicators: Grimacing Pain Intervention(s): Limited activity within patient's tolerance, Monitored during session, Repositioned  Home Living                                          Prior Functioning/Environment              Frequency  Min 2X/week        Progress Toward Goals  OT Goals(current goals can now be found in the care plan section)  Progress towards OT goals: Progressing toward goals  Acute Rehab OT Goals Patient Stated Goal: to get some sleep OT Goal Formulation: Patient unable to participate in goal setting Time For Goal Achievement: 05/21/22 Potential to Achieve Goals: Fair  Plan Discharge plan remains appropriate    Co-evaluation                 AM-PAC OT "6 Clicks" Daily Activity     Outcome Measure   Help from another person eating meals?: A Little Help from another person taking care of personal grooming?: A Little Help from another person toileting, which includes using toliet, bedpan, or urinal?: Total Help from another person bathing (including washing, rinsing, drying)?: A Lot Help from another person to put on and taking off regular upper body clothing?: A Lot Help from another person to put on and taking off regular lower body clothing?: Total 6 Click Score: 12    End of Session    OT Visit Diagnosis: Other abnormalities of gait and mobility (R26.89);Muscle weakness (generalized) (M62.81);Other symptoms and signs  involving cognitive function   Activity Tolerance Patient limited by lethargy;Patient limited by fatigue   Patient Left in bed;with call bell/phone within reach;with bed alarm set   Nurse Communication Mobility status        Time: 1610-9604 OT Time Calculation (min): 20 min  Charges: OT General Charges $OT Visit: 1 Visit OT Treatments $Self Care/Home Management : 8-22 mins  Pollyann Glen E. Keymiah Lyles, OTR/L Acute Rehabilitation Services [phone]   Cherlyn Cushing 05/11/2022, 10:24 AM

## 2022-05-12 DIAGNOSIS — A0472 Enterocolitis due to Clostridium difficile, not specified as recurrent: Secondary | ICD-10-CM | POA: Diagnosis not present

## 2022-05-12 LAB — CBC WITH DIFFERENTIAL/PLATELET
Abs Immature Granulocytes: 0.03 10*3/uL (ref 0.00–0.07)
Basophils Absolute: 0.1 10*3/uL (ref 0.0–0.1)
Basophils Relative: 1 %
Eosinophils Absolute: 0.5 10*3/uL (ref 0.0–0.5)
Eosinophils Relative: 5 %
HCT: 29.1 % — ABNORMAL LOW (ref 36.0–46.0)
Hemoglobin: 9.7 g/dL — ABNORMAL LOW (ref 12.0–15.0)
Immature Granulocytes: 0 %
Lymphocytes Relative: 22 %
Lymphs Abs: 2.1 10*3/uL (ref 0.7–4.0)
MCH: 33.2 pg (ref 26.0–34.0)
MCHC: 33.3 g/dL (ref 30.0–36.0)
MCV: 99.7 fL (ref 80.0–100.0)
Monocytes Absolute: 1.3 10*3/uL — ABNORMAL HIGH (ref 0.1–1.0)
Monocytes Relative: 13 %
Neutro Abs: 5.7 10*3/uL (ref 1.7–7.7)
Neutrophils Relative %: 59 %
Platelets: 248 10*3/uL (ref 150–400)
RBC: 2.92 MIL/uL — ABNORMAL LOW (ref 3.87–5.11)
RDW: 19.7 % — ABNORMAL HIGH (ref 11.5–15.5)
WBC: 9.7 10*3/uL (ref 4.0–10.5)
nRBC: 0.4 % — ABNORMAL HIGH (ref 0.0–0.2)

## 2022-05-12 LAB — RENAL FUNCTION PANEL
Albumin: 2.7 g/dL — ABNORMAL LOW (ref 3.5–5.0)
Anion gap: 12 (ref 5–15)
BUN: 11 mg/dL (ref 8–23)
CO2: 25 mmol/L (ref 22–32)
Calcium: 9.2 mg/dL (ref 8.9–10.3)
Chloride: 98 mmol/L (ref 98–111)
Creatinine, Ser: 3.58 mg/dL — ABNORMAL HIGH (ref 0.44–1.00)
GFR, Estimated: 13 mL/min — ABNORMAL LOW (ref >60)
Glucose, Bld: 93 mg/dL (ref 70–99)
Phosphorus: 2.2 mg/dL — ABNORMAL LOW (ref 2.5–4.6)
Potassium: 3.7 mmol/L (ref 3.5–5.1)
Sodium: 135 mmol/L (ref 135–145)

## 2022-05-12 LAB — MAGNESIUM: Magnesium: 2.1 mg/dL (ref 1.7–2.4)

## 2022-05-12 MED ORDER — CHLORHEXIDINE GLUCONATE CLOTH 2 % EX PADS
6.0000 | MEDICATED_PAD | Freq: Every day | CUTANEOUS | Status: DC
  Administered 2022-05-13 – 2022-05-20 (×5): 6 via TOPICAL

## 2022-05-12 NOTE — Progress Notes (Signed)
Physical Therapy Treatment Patient Details Name: [name] MRN: [id number] DOB: 03/23/1951 Today's Date: 05/12/2022   History of Present Illness Pt is a 71 y.o. female admitted 05/06/22 with SOB and infection. 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 S/p RUE AVG excision with revision 5/15. Course complicated by hypotension perioperatively; ETT 5/15-5/16. Workup for septic shock secondary to MRSA bacteremia. PMH includes ESRD (HD MWF), CKD, CHF, HTN, HLD.    PT Comments    Pt received supine and agreeable to session with fair progress. Pt able to come to sitting EOB with mod assist to elevate trunk. Pt continues to demonstrate poor sitting balance with tendency for abruptly lying back down requiring assist to come back to sitting. Pt able to come to standing with RW with mod assist, pt with continue posterior lean with ability to correct with heavy cues and step pivot to recliner. Pt agreeable to time up in chair at end of session. Pt continues to benefit from skilled PT services to progress toward functional mobility goals.    Recommendations for follow up therapy are one component of a multi-disciplinary discharge planning process, led by the attending physician.  Recommendations may be updated based on patient status, additional functional criteria and insurance authorization.  Follow Up Recommendations  Skilled nursing-short term rehab (<3 hours/day)     Assistance Recommended at Discharge Frequent or constant Supervision/Assistance  Patient can return home with the following A lot of help with bathing/dressing/bathroom;Assistance with cooking/housework;Direct supervision/assist for medications management;Direct supervision/assist for financial management;Assist for transportation;Help with stairs or ramp for entrance;A lot of help with walking and/or transfers   Equipment Recommendations  None recommended by PT     Recommendations for Other Services OT consult     Precautions / Restrictions Precautions Precautions: Fall Restrictions Weight Bearing Restrictions: No     Mobility  Bed Mobility Overal bed mobility: Needs Assistance Bed Mobility: Supine to Sit   Sidelying to sit: Mod assist, HOB elevated       General bed mobility comments: patient leaning to L side upon entry, mod a to elevate trunk, pt with stong posterior push/lean    Transfers Overall transfer level: Needs assistance Equipment used: Rolling walker (2 wheels) Transfers: Sit to/from Stand, Bed to chair/wheelchair/BSC Sit to Stand: Mod assist   Step pivot transfers: Mod assist       General transfer comment: mod a for RW management to step pivot to recliner, strong push/lean posterior on stadning but able to correct with cues.    Ambulation/Gait               General Gait Details: unable to ambulate prior to  sitting back down despite cues   Stairs             Wheelchair Mobility    Modified Rankin (Stroke Patients Only)       Balance Overall balance assessment: Needs assistance Sitting-balance support: Feet supported Sitting balance-Leahy Scale: Poor Sitting balance - Comments: poor balance at trunk noted in supported seated position Postural control: Posterior lean Standing balance support: Bilateral upper extremity supported, During functional activity, Reliant on assistive device for balance Standing balance-Leahy Scale: Poor Standing balance comment: stood for brief time this session                            Cognition Arousal/Alertness: Lethargic Behavior During Therapy: WFL for tasks assessed/performed Overall Cognitive Status: No  family/caregiver present to determine baseline cognitive functioning                                          Exercises      General Comments        Pertinent Vitals/Pain Pain Assessment Pain Assessment:  Faces Faces Pain Scale: Hurts a little bit Pain Location: bottom Pain Descriptors / Indicators: Grimacing, Discomfort Pain Intervention(s): Limited activity within patient's tolerance, Monitored during session, Repositioned    Home Living                          Prior Function            PT Goals (current goals can now be found in the care plan section) Acute Rehab PT Goals Patient Stated Goal: to return to nursing home PT Goal Formulation: With patient Time For Goal Achievement: 05/22/22    Frequency    Min 2X/week      PT Plan      Co-evaluation              AM-PAC PT "6 Clicks" Mobility   Outcome Measure  Help needed turning from your back to your side while in a flat bed without using bedrails?: A Lot Help needed moving from lying on your back to sitting on the side of a flat bed without using bedrails?: A Lot Help needed moving to and from a bed to a chair (including a wheelchair)?: A Lot Help needed standing up from a chair using your arms (e.g., wheelchair or bedside chair)?: A Lot Help needed to walk in hospital room?: Total Help needed climbing 3-5 steps with a railing? : Total 6 Click Score: 10    End of Session   Activity Tolerance: Patient limited by fatigue;Other (comment) (limited by cognition) Patient left: with call bell/phone within reach;in chair;with chair alarm set Nurse Communication: Mobility status PT Visit Diagnosis: Unsteadiness on feet (R26.81);Other abnormalities of gait and mobility (R26.89);Muscle weakness (generalized) (M62.81);Difficulty in walking, not elsewhere classified (R26.2)     Time: 5176-1607 PT Time Calculation (min) (ACUTE ONLY): 20 min  Charges:  $Therapeutic Activity: 8-22 mins                     Thirza Pellicano R. PTA Acute Rehabilitation Services Office: [phone]    Catalina Antigua 05/12/2022, 2:25 PM

## 2022-05-12 NOTE — Progress Notes (Signed)
Progress Note    [name]   OYD:[id number]  DOB: [birth year]  DOA: 05/06/2022     6 PCP: [name], MD  Initial CC: SOB, CP  Hospital Course: [name] is a 70 yo female with PMH dCHF, ESRD on HD, CVA, CAD, HLD, HTN, GERD who presented with shortness of breath.  She was recently hospitalized from 04/26/2022 until 05/04/2022 with septic shock due to staph bacteremia from infected AV graft.  She was discharged on Ancef to complete course followed by chronic oral suppression. On admission she was found to be somnolent, hypoxic, and volume overloaded. Further work-up revealed elevated troponin which further uptrended consistent with NSTEMI.  She was evaluated by cardiology and also started on a heparin drip on admission.  She was not considered a good interventional candidate.  Interval History:  No events overnight.  Still having liquid stool noted in rectal tube. Encouraged her to continue eating well. Denies any abdominal pain/discomfort.   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 Plan for Ancef post HD MWF until 6/13 then oral doxy 100 mg BID (due to CDI while on Ancef) - continue Ancef  - appreciate ID evaluation after re-admission  -Follow-up repeat blood cultures obtained on 05/06/2022: NGTD x 3 days  NSTEMI (non-ST elevated myocardial infarction) ([place]) - SOB and CP on admission; trop elevated with peak at 1,008 - s/p 48 hr of heparin drip completed - continue statin and BB - per cardiology, not currently an interventional candidate due to multiple medical issues currently  -Plavix monotherapy okay per cardiology  C. difficile diarrhea - s/p recent diarrhea; C diff Ag positive, toxin negative, PCR positive - started on oral Vanc - per ID continue oral vancomycin 125 mg QID x 10 days then 125 mg BID for 7 days after completion of IV abx - still large amount of diarrhea;  rectal tube to be placed 5/27; awaiting improvement in diarrhea before being considered stable to discharge  End-stage renal disease on hemodialysis ([place]) - Continue chronic midodrine.  BP stable - Nephrology following, continue inpatient dialysis per nephrology  Dermatitis - skin breakdown from ongoing diarrhea - WOC consult appreciated - did not respond much to Gerhardt's cream, now on desitin trial today  Physical deconditioning - Planning for discharge back to [facility] at discharge.  Will need insurance auth  Acute on chronic diastolic (congestive) heart failure (HCC) - Volume removal planned with HD - CXR on admission consistent with volume overload   Old records reviewed in assessment of this patient  Antimicrobials: Ancef >> current  Oral vancomycin 05/07/2022 >> current  DVT prophylaxis:  heparin injection 5,000 Units Start: 05/09/22 0600   Code Status:   Code Status: DNR  Disposition Plan:  SNF Status is: Inpt  Objective: Blood pressure (!) 92/27, pulse 67, temperature (!) 97.4 F (36.3 C), temperature source Oral, resp. rate 18, height '5\' 2"'$  (1.575 m), weight 79.4 kg, SpO2 100 %.  Examination:  Physical Exam Constitutional:      General: She is not in acute distress. HENT:     Head: Normocephalic and atraumatic.     Mouth/Throat:     Mouth: Mucous membranes are moist.  Eyes:     Extraocular Movements: Extraocular movements intact.  Cardiovascular:     Rate and Rhythm: Normal rate and regular rhythm.  Pulmonary:     Effort: Pulmonary effort is normal.     Breath sounds:  Normal breath sounds.  Abdominal:     General: Bowel sounds are normal. There is no distension.     Palpations: Abdomen is soft.     Tenderness: There is no abdominal tenderness.  Musculoskeletal:     Cervical back: Normal range of motion and neck supple.     Comments: Left arm fistula appreciated with good thrill.  Old right fistula noted with dressing in place  Skin:     General: Skin is warm and dry.  Neurological:     General: No focal deficit present.     Mental Status: She is alert.  Psychiatric:        Mood and Affect: Mood normal.     Consultants:  ID Nephrology Cardiology  Procedures:    Data Reviewed: Results for orders placed or performed during the hospital encounter of 05/06/22 (from the past 24 hour(s))  CBC with Differential/Platelet     Status: Abnormal   Collection Time: 05/12/22 12:22 AM  Result Value Ref Range   WBC 9.7 4.0 - 10.5 K/uL   RBC 2.92 (L) 3.87 - 5.11 MIL/uL   Hemoglobin 9.7 (L) 12.0 - 15.0 g/dL   HCT 29.1 (L) 36.0 - 46.0 %   MCV 99.7 80.0 - 100.0 fL   MCH 33.2 26.0 - 34.0 pg   MCHC 33.3 30.0 - 36.0 g/dL   RDW 19.7 (H) 11.5 - 15.5 %   Platelets 248 150 - 400 K/uL   nRBC 0.4 (H) 0.0 - 0.2 %   Neutrophils Relative % 59 %   Neutro Abs 5.7 1.7 - 7.7 K/uL   Lymphocytes Relative 22 %   Lymphs Abs 2.1 0.7 - 4.0 K/uL   Monocytes Relative 13 %   Monocytes Absolute 1.3 (H) 0.1 - 1.0 K/uL   Eosinophils Relative 5 %   Eosinophils Absolute 0.5 0.0 - 0.5 K/uL   Basophils Relative 1 %   Basophils Absolute 0.1 0.0 - 0.1 K/uL   Immature Granulocytes 0 %   Abs Immature Granulocytes 0.03 0.00 - 0.07 K/uL  Magnesium     Status: None   Collection Time: 05/12/22 12:22 AM  Result Value Ref Range   Magnesium 2.1 1.7 - 2.4 mg/dL  Renal function panel     Status: Abnormal   Collection Time: 05/12/22 12:22 AM  Result Value Ref Range   Sodium 135 135 - 145 mmol/L   Potassium 3.7 3.5 - 5.1 mmol/L   Chloride 98 98 - 111 mmol/L   CO2 25 22 - 32 mmol/L   Glucose, Bld 93 70 - 99 mg/dL   BUN 11 8 - 23 mg/dL   Creatinine, Ser 3.58 (H) 0.44 - 1.00 mg/dL   Calcium 9.2 8.9 - 10.3 mg/dL   Phosphorus 2.2 (L) 2.5 - 4.6 mg/dL   Albumin 2.7 (L) 3.5 - 5.0 g/dL   GFR, Estimated 13 (L) >60 mL/min   Anion gap 12 5 - 15    I have Reviewed nursing notes, Vitals, and Lab results since pt's last encounter. Pertinent lab results : see above I  have ordered test including BMP, CBC, Mg I have reviewed the last note from staff over past 24 hours I have discussed pt's care plan and test results with nursing staff, case manager   LOS: 6 days   Dwyane Dee, MD [facility] 05/12/2022, 3:23 PM

## 2022-05-12 NOTE — Progress Notes (Signed)
Subjective: Seen in room said tolerated dialysis yesterday only had 843 mL UF.  Only complaint today is continued to have diarrhea but has rectal tube in place.  Denies shortness of breath or chest pain or abdominal pain.  Tolerating use of vascular graft that was infected  Objective Vital signs in last 24 hours: Vitals:   05/12/22 0500 05/12/22 0603 05/12/22 0624 05/12/22 0807  BP:  (!) 72/56 (!) 95/34 (!) 92/27  Pulse:  72  67  Resp:  16  18  Temp:  97.9 F (36.6 C)  (!) 97.4 F (36.3 C)  TempSrc:  Oral  Oral  SpO2:  100%  100%  Weight: 79.4 kg     Height:       Weight change: 3.629 kg Physical Exam: General: Thin, frail chronically ill elderly female NAD Heart: RRR, 1/6 SEM no rub Lungs: CTA bilaterally  nonlabored breathing Abdomen: NABS, soft, ND NT has rectal tube Extremities: No pedal edema Dialysis Access: Right upper arm AV GG positive bruit, scabbing healing wound lower portion, old left upper arm AV fistula with aneurysmal positive bruit     Dialysis Orders: [facility]  4h  350/600  45kg   2/2 bath  P2 R AVG  --Hep none - mircera 50 mcg IV q 4 weeks - hectorol 2 ug IV tiw - sensipar 30 tiw po   Problem/Plan:   Acute hypoxic respiratory failure: CXR CW CHF , status post HD symptomatic improving  C Diff + - per PMD NSTEMI - troponin elevated. cardiology following. per cardiology not currently interventional candidate due to multiple medical issues/Plavix monotherapy okay No CP this AM.  Recent MSSA sepsis/ AVG infection -plan continue IV cefazolin until 6/13 with plans for oral Doxy 100 mg twice daily (due to CDI while on Ancef )thereafter for suppression/ ID reeval noted per admit ESRD - on HD MWF.  AVG required resection during last admit but still functional. Next HD tomorrow 5/31 BP/ volume -currently does not appear volume overloaded, has had problems with low blood pressures using midodrine.   wts Questionable  because of bed weights /if weights correct not  close to EDW.. Continue to lower volume as tolerated.  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  Follow HGB.  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  continue supplements Excoriation of buttocks: Rx per wound care consult Dementia/debility= at baseline and at [facility] for SNF  Ernest Haber, PA-C [place] [phone] 05/12/2022,3:05 PM  LOS: 6 days   Labs: Basic Metabolic Panel: Recent Labs  Lab 05/08/22 1700 05/09/22 0046 05/10/22 0037 05/11/22 0032 05/12/22 0022  NA 132*   < > 135 135 135  K 4.1   < > 4.6 4.6 3.7  CL 95*   < > 101 102 98  CO2 20*   < > 21* 21* 25  GLUCOSE 75   < > 99 117* 93  BUN 28*   < > 15 24* 11  CREATININE 4.54*   < > 3.94* 5.45* 3.58*  CALCIUM 10.1   < > 10.1 10.1 9.2  PHOS 3.7  --   --   --  2.2*   < > = values in this interval not displayed.   Liver Function Tests: Recent Labs  Lab 05/06/22 0700 05/08/22 1700 05/12/22 0022  AST  31  --   --   ALT 5  --   --   ALKPHOS 105  --   --   BILITOT 1.0  --   --   PROT 7.3  --   --   ALBUMIN 3.2* 2.8* 2.7*   No results for input(s): LIPASE, AMYLASE in the last 168 hours. No results for input(s): AMMONIA in the last 168 hours. CBC: Recent Labs  Lab 05/08/22 0340 05/09/22 0046 05/10/22 0037 05/11/22 0032 05/12/22 0022  WBC 7.6 9.1 9.7 10.2 9.7  NEUTROABS 3.3 5.2 5.4 5.2 5.7  HGB 9.2* 10.2* 10.2* 10.1* 9.7*  HCT 27.7* 29.9* 31.5* 32.2* 29.1*  MCV 98.2 96.5 101.3* 102.9* 99.7  PLT 216 217 242 251 248   Cardiac Enzymes: No results for input(s): CKTOTAL, CKMB, CKMBINDEX, TROPONINI in the last 168 hours. CBG: No results for input(s): GLUCAP in the last 168 hours.  Studies/Results: No results found. Medications:   ceFAZolin (ANCEF) IV 2 g (05/11/22 1610)    atorvastatin  80 mg Oral Daily   B-complex  with vitamin C  1 tablet Oral Daily   And   folic acid  1 mg Oral Daily   [START ON 05/13/2022] Chlorhexidine Gluconate Cloth  6 each Topical Q0600   cinacalcet  30 mg Oral Q M,W,F-HD   clopidogrel  75 mg Oral Daily   escitalopram  10 mg Oral Daily   famotidine  20 mg Oral Daily   feeding supplement  237 mL Oral BID BM   heparin injection (subcutaneous)  5,000 Units Subcutaneous Q8H   liver oil-zinc oxide   Topical Q4H   metoprolol tartrate  12.5 mg Oral BID   midodrine  5 mg Oral TID WC   sevelamer carbonate  1,600 mg Oral TID WC   sodium chloride flush  3 mL Intravenous Q12H   [START ON 05/17/2022] vancomycin  125 mg Oral Q12H   vancomycin  125 mg Oral QID

## 2022-05-13 DIAGNOSIS — T8612 Kidney transplant failure: Secondary | ICD-10-CM | POA: Diagnosis not present

## 2022-05-13 DIAGNOSIS — I214 Non-ST elevation (NSTEMI) myocardial infarction: Secondary | ICD-10-CM | POA: Diagnosis not present

## 2022-05-13 DIAGNOSIS — N186 End stage renal disease: Secondary | ICD-10-CM | POA: Diagnosis not present

## 2022-05-13 DIAGNOSIS — Z992 Dependence on renal dialysis: Secondary | ICD-10-CM | POA: Diagnosis not present

## 2022-05-13 LAB — RENAL FUNCTION PANEL
Albumin: 3 g/dL — ABNORMAL LOW (ref 3.5–5.0)
Anion gap: 13 (ref 5–15)
BUN: 23 mg/dL (ref 8–23)
CO2: 23 mmol/L (ref 22–32)
Calcium: 9.6 mg/dL (ref 8.9–10.3)
Chloride: 99 mmol/L (ref 98–111)
Creatinine, Ser: 5.05 mg/dL — ABNORMAL HIGH (ref 0.44–1.00)
GFR, Estimated: 9 mL/min — ABNORMAL LOW (ref >60)
Glucose, Bld: 89 mg/dL (ref 70–99)
Phosphorus: 2.6 mg/dL (ref 2.5–4.6)
Potassium: 4.5 mmol/L (ref 3.5–5.1)
Sodium: 135 mmol/L (ref 135–145)

## 2022-05-13 LAB — CBC WITH DIFFERENTIAL/PLATELET
Abs Immature Granulocytes: 0.04 10*3/uL (ref 0.00–0.07)
Basophils Absolute: 0.1 10*3/uL (ref 0.0–0.1)
Basophils Relative: 1 %
Eosinophils Absolute: 0.4 10*3/uL (ref 0.0–0.5)
Eosinophils Relative: 5 %
HCT: 34.5 % — ABNORMAL LOW (ref 36.0–46.0)
Hemoglobin: 11.1 g/dL — ABNORMAL LOW (ref 12.0–15.0)
Immature Granulocytes: 1 %
Lymphocytes Relative: 27 %
Lymphs Abs: 2.1 10*3/uL (ref 0.7–4.0)
MCH: 32.6 pg (ref 26.0–34.0)
MCHC: 32.2 g/dL (ref 30.0–36.0)
MCV: 101.2 fL — ABNORMAL HIGH (ref 80.0–100.0)
Monocytes Absolute: 1.2 10*3/uL — ABNORMAL HIGH (ref 0.1–1.0)
Monocytes Relative: 15 %
Neutro Abs: 4.1 10*3/uL (ref 1.7–7.7)
Neutrophils Relative %: 51 %
Platelets: 227 10*3/uL (ref 150–400)
RBC: 3.41 MIL/uL — ABNORMAL LOW (ref 3.87–5.11)
RDW: 20 % — ABNORMAL HIGH (ref 11.5–15.5)
WBC: 7.9 10*3/uL (ref 4.0–10.5)
nRBC: 0.3 % — ABNORMAL HIGH (ref 0.0–0.2)

## 2022-05-13 LAB — MAGNESIUM: Magnesium: 2.3 mg/dL (ref 1.7–2.4)

## 2022-05-13 NOTE — Progress Notes (Signed)
Subjective: Seen in room no complaints for dialysis later today continues to require rectal tube for diarrhea with C. difficile  Objective Vital signs in last 24 hours: Vitals:   05/12/22 2136 05/12/22 2140 05/13/22 0555 05/13/22 0926  BP: (!) 101/42 (!) 98/42 116/71 (!) 108/29  Pulse: 69 68 66 73  Resp:   18 18  Temp: 98.1 F (36.7 C)  97.8 F (36.6 C) 98.5 F (36.9 C)  TempSrc: Oral  Oral Oral  SpO2: 100%  100% 99%  Weight:      Height:       Weight change:   Physical Exam: General: Alert, pleasant ,frail chronically ill elderly female NAD Heart: RRR, 1/6 SEM no rub Lungs: CTA bilaterally  nonlabored breathing Abdomen: NABS, soft, ND NT has rectal tube Extremities: No pedal edema Dialysis Access: Right upper arm AV GG positive bruit, scabbing healing wound lower portion, 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 cardiology following. per cardiology not currently interventional candidate due to multiple medical issues/Plavix monotherapy okay No CP this AM.  Recent MSSA sepsis/ AVG infection -plan continue IV cefazolin until 6/13 with plans for oral Doxy 100 mg twice daily (due to CDI while on Ancef )thereafter for suppression/ ID reeval noted per admit ESRD - on HD MWF.  AVG required resection during last admit but still functional. Next today on schedule BP/ volume -currently does not appear volume overloaded, has had problems with low blood pressures using midodrine.   wts Questionable  because of bed weights /if weights correct not close to EDW.. Continue to lower volume as  tolerated.  Anemia esrd -Hgb 10.1> 9.7 > 11.1 this a.m. has received ESA recently.  Follow HGB.  Will hold ESA if continues current level BMD ckd - C Ca high, phosphorus 2.6 Hold ing hectorol with ^ ca //continue home sensipar /binder =Renvela 1600 mg dc with low phosphorus recently follow trends Nutrition 3.0 ALB renal diet w/fluid restrictions.  continue supplements Excoriation of buttocks: Rx per wound care consult Dementia/debility= at baseline and at [facility] for SNF   Ernest Haber, PA-C [place] [phone] 05/13/2022,10:18 AM  LOS: 7 days   Labs: Basic Metabolic Panel: Recent Labs  Lab 05/08/22 1700 05/09/22 0046 05/11/22 0032 05/12/22 0022 05/13/22 0322  NA 132*   < > 135 135 135  K 4.1   < > 4.6 3.7 4.5  CL 95*   < > 102 98 99  CO2 20*   < > 21* 25 23  GLUCOSE 75   < > 117* 93 89  BUN 28*   < > 24* 11 23  CREATININE 4.54*   < > 5.45* 3.58* 5.05*  CALCIUM 10.1   < > 10.1 9.2 9.6  PHOS 3.7  --   --  2.2* 2.6   < > = values in this interval not displayed.   Liver Function Tests: Recent Labs  Lab 05/08/22 1700 05/12/22 0022 05/13/22 0322  ALBUMIN 2.8* 2.7* 3.0*  No results for input(s): LIPASE, AMYLASE in the last 168 hours. No results for input(s): AMMONIA in the last 168 hours. CBC: Recent Labs  Lab 05/09/22 0046 05/10/22 0037 05/11/22 0032 05/12/22 0022 05/13/22 0322  WBC 9.1 9.7 10.2 9.7 7.9  NEUTROABS 5.2 5.4 5.2 5.7 4.1  HGB 10.2* 10.2* 10.1* 9.7* 11.1*  HCT 29.9* 31.5* 32.2* 29.1* 34.5*  MCV 96.5 101.3* 102.9* 99.7 101.2*  PLT 217 242 251 248 227   Cardiac Enzymes: No results for input(s): CKTOTAL, CKMB, CKMBINDEX, TROPONINI in the last 168 hours. CBG: No results for input(s): GLUCAP in the last 168 hours.  Studies/Results: No results found. Medications:   ceFAZolin (ANCEF) IV 2 g (05/11/22 1610)    atorvastatin  80 mg Oral Daily   B-complex with vitamin C  1 tablet Oral Daily   And   folic acid  1 mg Oral  Daily   Chlorhexidine Gluconate Cloth  6 each Topical Q0600   cinacalcet  30 mg Oral Q M,W,F-HD   clopidogrel  75 mg Oral Daily   escitalopram  10 mg Oral Daily   famotidine  20 mg Oral Daily   feeding supplement  237 mL Oral BID BM   heparin injection (subcutaneous)  5,000 Units Subcutaneous Q8H   liver oil-zinc oxide   Topical Q4H   metoprolol tartrate  12.5 mg Oral BID   midodrine  5 mg Oral TID WC   sodium chloride flush  3 mL Intravenous Q12H   [START ON 05/17/2022] vancomycin  125 mg Oral Q12H   vancomycin  125 mg Oral QID

## 2022-05-13 NOTE — Progress Notes (Deleted)
POST OPERATIVE OFFICE NOTE    CC:  F/u for surgery  HPI:  This is a 71 y.o. female who has recent history of wound over her left AV grafts s/p Excision of segment of right arm AV graft with revision (interposition 6 mm PTFE graft) on 04/27/22  by Dr. Scot Dock.  Infectious disease was consulted for MSSA bacteremia.  She was placed on suppressive abx for 6 weeks.  Pt returns today for follow up.  Pt states ***   Allergies  Allergen Reactions   Sulfa Antibiotics Other (See Comments)    Per patient, both parents allergic-so will not take   Adhesive [Tape] Itching    No current facility-administered medications for this visit.   No current outpatient medications on file.   Facility-Administered Medications Ordered in Other Visits  Medication Dose Route Frequency Provider Last Rate Last Admin   acetaminophen (TYLENOL) tablet 650 mg  650 mg Oral Q6H PRN Karmen Bongo, MD   650 mg at 05/12/22 0037   Or   acetaminophen (TYLENOL) suppository 650 mg  650 mg Rectal Q6H PRN Karmen Bongo, MD       albuterol (PROVENTIL) (2.5 MG/3ML) 0.083% nebulizer solution 2.5 mg  2.5 mg Nebulization Q6H PRN Karmen Bongo, MD       atorvastatin (LIPITOR) tablet 80 mg  80 mg Oral Daily Karmen Bongo, MD   80 mg at 05/13/22 0830   B-complex with vitamin C tablet 1 tablet  1 tablet Oral Daily Karmen Bongo, MD   1 tablet at 87/56/43 3295   And   folic acid (FOLVITE) tablet 1 mg  1 mg Oral Daily Karmen Bongo, MD   1 mg at 05/13/22 0830   camphor-menthol (SARNA) lotion 1 application.  1 application. Topical Q8H PRN Karmen Bongo, MD       And   hydrOXYzine (ATARAX) tablet 25 mg  25 mg Oral Q8H PRN Karmen Bongo, MD   25 mg at 05/07/22 0108   ceFAZolin (ANCEF) IVPB 2g/100 mL premix  2 g Intravenous Q M,W,F-1800 Laurice Record, MD 200 mL/hr at 05/11/22 1610 2 g at 05/11/22 1610   Chlorhexidine Gluconate Cloth 2 % PADS 6 each  6 each Topical Q0600 Ernest Haber, PA-C   6 each at 05/13/22 1884    cinacalcet (SENSIPAR) tablet 30 mg  30 mg Oral Q M,W,F-HD Karmen Bongo, MD   30 mg at 05/11/22 1321   clopidogrel (PLAVIX) tablet 75 mg  75 mg Oral Daily Karmen Bongo, MD   75 mg at 05/13/22 0830   dextromethorphan (DELSYM) 30 MG/5ML liquid 15 mg  15 mg Oral BID PRN Dwyane Dee, MD       docusate sodium (ENEMEEZ) enema 283 mg  1 enema Rectal PRN Karmen Bongo, MD       escitalopram Loma Sousa) tablet 10 mg  10 mg Oral Daily Karmen Bongo, MD   10 mg at 05/13/22 0830   famotidine (PEPCID) tablet 20 mg  20 mg Oral Daily Dwyane Dee, MD   20 mg at 05/13/22 0830   feeding supplement (ENSURE ENLIVE / ENSURE PLUS) liquid 237 mL  237 mL Oral BID BM Karmen Bongo, MD   237 mL at 05/13/22 1660   feeding supplement (NEPRO CARB STEADY) liquid 237 mL  237 mL Oral TID PRN Karmen Bongo, MD   237 mL at 05/10/22 1106   heparin injection 5,000 Units  5,000 Units Subcutaneous Q8H Johnnette Gourd D, RPH   5,000 Units at 05/13/22 6301   hydrALAZINE (APRESOLINE)  injection 5 mg  5 mg Intravenous Q4H PRN Karmen Bongo, MD       liver oil-zinc oxide (DESITIN) 40 % ointment   Topical Q4H Dwyane Dee, MD   Given at 05/13/22 0820   metoprolol tartrate (LOPRESSOR) tablet 12.5 mg  12.5 mg Oral BID Ledora Bottcher, PA   12.5 mg at 05/11/22 1017   midodrine (PROAMATINE) tablet 5 mg  5 mg Oral TID WC Karmen Bongo, MD   5 mg at 05/13/22 0830   ondansetron (ZOFRAN) tablet 4 mg  4 mg Oral Q6H PRN Karmen Bongo, MD       Or   ondansetron [facility]) injection 4 mg  4 mg Intravenous Q6H PRN Karmen Bongo, MD   4 mg at 05/09/22 1036   sodium chloride flush (NS) 0.9 % injection 3 mL  3 mL Intravenous Lillia Mountain, MD   3 mL at 05/13/22 0820   sorbitol 70 % solution 30 mL  30 mL Oral PRN Karmen Bongo, MD       [START ON 05/17/2022] vancomycin (VANCOCIN) 50 mg/mL oral solution SOLN 125 mg  125 mg Oral Q12H Laurice Record, MD       vancomycin (VANCOCIN) capsule 125 mg  125 mg Oral QID Laurice Record, MD    125 mg at 05/13/22 0830   zolpidem (AMBIEN) tablet 5 mg  5 mg Oral QHS PRN Karmen Bongo, MD   5 mg at 05/12/22 0037     ROS:  See HPI  Physical Exam:  ***  Incision:  *** Extremities:  *** Neuro: *** Abdomen:  ***    Assessment/Plan:  This is a 71 y.o. female who is s/p: ***  -***   Roxy Horseman PA-C [facility] [phone]   Clinic MD:  Donzetta Matters

## 2022-05-13 NOTE — Progress Notes (Signed)
Mobility Specialist Progress Note:   05/13/22 1030  Mobility  Activity Transferred from bed to chair  Level of Assistance Minimal assist, patient does 75% or more  Assistive Device Front wheel walker  Distance Ambulated (ft) 2 ft  Activity Response Tolerated well  $Mobility charge 1 Mobility   Pt eager for mobility session. Required minG for bed mobility. Pt displaying some impulsivity with transfer, educated to wait for MS to assist for safety. Pt completed ~5 STS to complete pericare, having to sit multiple times d/t fatigue. Flexiseal noted to be leaking, RN notified. Pt left in chair with all needs met, chair alarm on.   Nelta Numbers Acute Rehab Secure Chat or Office Phone: [phone]

## 2022-05-13 NOTE — TOC Progression Note (Signed)
Transition of Care [facility]) - Progression Note    Patient Details  Name: [name] MRN: [id number] Date of Birth: 1951/09/23  Transition of Care [facility]) CM/SW Contact  Reece Agar, [place] Phone Number: 05/13/2022, 2:17 PM  Clinical Narrative:    Pt not medically stable for DC, pt will DC back to [facility] when medically stable. Josem Kaufmann has been started, pending.    Expected Discharge Plan: [place] Barriers to Discharge: Continued Medical Work up, Ship broker  Expected Discharge Plan and Services Expected Discharge Plan: [place]       Living arrangements for the past 2 months: [place] Determinants of Health (SDOH) Interventions    Readmission Risk Interventions    11/11/2021    3:49 PM 08/18/2021    3:01 PM  Readmission Risk Prevention Plan  Transportation Screening Complete Complete  Medication Review (RN Care Manager) Complete Complete  PCP or Specialist appointment within 3-5 days of discharge Complete Complete  HRI or Home Care Consult Complete Complete  SW Recovery Care/Counseling Consult Complete Complete  Palliative Care Screening Not Applicable Not Applicable  Skilled Nursing Facility Complete Complete

## 2022-05-13 NOTE — Progress Notes (Signed)
PROGRESS NOTE    [name]  WUJ:[id number] DOB: 1951/07/02 DOA: 05/06/2022 PCP: Renford Dills, MD  Chief Complaint  Patient presents with   Shortness of Breath    Brief Narrative:  [name] is Kathryn Mulford 71 yo West with PMH dCHF, ESRD on HD, CVA, CAD, HLD, HTN, GERD who presented with shortness of breath.  She was recently hospitalized from 04/26/2022 until 05/04/2022 with septic shock due to staph bacteremia from infected AV graft.  She was discharged on Ancef to complete course followed by chronic oral suppression. On admission she was found to be somnolent, hypoxic, and volume overloaded. Further work-up revealed elevated troponin which further uptrended consistent with NSTEMI.  She was evaluated by cardiology and also started on Dawt Reeb heparin drip on admission.  She was not considered Amritpal Shropshire good interventional candidate.    Assessment & Plan:   Principal Problem:   NSTEMI (non-ST elevated myocardial infarction) (HCC) Active Problems:   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HCC)   C. difficile diarrhea   End-stage renal disease on hemodialysis (HCC)   Dermatitis   Acute on chronic diastolic (congestive) heart failure (HCC)   Physical deconditioning   Assessment and Plan: * NSTEMI (non-ST elevated myocardial infarction) (HCC) - SOB and CP on admission; trop elevated with peak at 1,008 - s/p 48 hr of heparin drip completed - continue statin and BB - per cardiology, not currently an interventional candidate due to multiple medical issues currently  -Plavix monotherapy okay per cardiology  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HCC) - hospitalized 5/14 - 5/22; AV graft infection, MSSA bacteremia; evaluated by ID. Plan for Ancef post HD MWF until 6/13 then oral doxy 100 mg BID (due to CDI while on Ancef) - continue Ancef  - appreciate ID evaluation after re-admission  - Follow-up repeat blood cultures obtained on 05/06/2022: NGTD x 5 days  C. difficile diarrhea - s/p recent diarrhea; C diff Ag positive,  toxin negative, PCR positive - started on oral Vanc - per ID (5/25) continue oral vancomycin 125 mg QID x 10 days then 125 mg BID for 7 days after completion of IV abx - still large amount of diarrhea; rectal tube to be placed 5/27-31; awaiting further improvement in diarrhea before being considered stable to discharge  End-stage renal disease on hemodialysis (HCC) - Continue chronic midodrine.  BP stable - Nephrology following, continue inpatient dialysis per nephrology  Dermatitis - skin breakdown from ongoing diarrhea - WOC consult appreciated - did not respond much to Gerhardt's cream, now on desitin trial   Acute on chronic diastolic (congestive) heart failure (HCC) - Volume removal planned with HD - CXR on admission consistent with volume overload  Physical deconditioning - Planning for discharge back to [facility] at discharge.  Will need insurance auth      DVT prophylaxis: heparin Code Status: DNR Family Communication: none at bedsdie Disposition:   Status is: Inpatient Remains inpatient appropriate because: continued diarrhea   Consultants:  ID nephrology  Procedures:  none  Antimicrobials:  Anti-infectives (From admission, onward)    Start     Dose/Rate Route Frequency Ordered Stop   05/17/22 1000  vancomycin (VANCOCIN) 50 mg/mL oral solution SOLN 125 mg        125 mg Oral Every 12 hours 05/07/22 1314 06/03/22 0959   05/08/22 1800  ceFAZolin (ANCEF) IVPB 2g/100 mL premix        2 g 200 mL/hr over 30 Minutes Intravenous Every M-W-F (1800) 05/07/22 1016 05/26/22 2359   05/08/22 1200  ceFAZolin (ANCEF) IVPB 1 g/50 mL premix  Status:  Discontinued        1 g 100 mL/hr over 30 Minutes Intravenous Every M-W-F (Hemodialysis) 05/07/22 0935 05/07/22 1016   05/07/22 1030  vancomycin (VANCOCIN) capsule 125 mg        125 mg Oral 4 times daily 05/07/22 0935 05/17/22 0959   05/06/22 1800  vancomycin (VANCOREADY) IVPB 500 mg/100 mL  Status:  Discontinued         500 mg 100 mL/hr over 60 Minutes Intravenous Every M-W-F (Hemodialysis) 05/06/22 1218 05/07/22 0935   05/06/22 1800  ceFEPIme (MAXIPIME) 2 g in sodium chloride 0.9 % 100 mL IVPB  Status:  Discontinued        2 g 200 mL/hr over 30 Minutes Intravenous Every M-W-F (Hemodialysis) 05/06/22 1218 05/07/22 0935   05/06/22 1145  ceFEPIme (MAXIPIME) 2 g in sodium chloride 0.9 % 100 mL IVPB        2 g 200 mL/hr over 30 Minutes Intravenous  Once 05/06/22 1143 05/06/22 1324   05/06/22 1145  vancomycin (VANCOCIN) IVPB 1000 mg/200 mL premix        1,000 mg 200 mL/hr over 60 Minutes Intravenous  Once 05/06/22 1143 05/06/22 1436       Subjective: No new complaints  Objective: Vitals:   05/13/22 1710 05/13/22 1715 05/13/22 1725 05/13/22 1730  BP:      Pulse: 69 67    Resp: 15 13 15 12   Temp:      TempSrc:      SpO2: 100% 100%    Weight:      Height:        Intake/Output Summary (Last 24 hours) at 05/13/2022 1747 Last data filed at 05/13/2022 1300 Gross per 24 hour  Intake 800 ml  Output 100 ml  Net 700 ml   Filed Weights   05/11/22 1301 05/11/22 1632 05/12/22 0500  Weight: 58.1 kg 58.5 kg 79.4 kg    Examination:  General exam: Appears calm and comfortable  Respiratory system: unlabored Cardiovascular system: RRR Gastrointestinal system: Abdomen is nondistended, soft and nontender. Copious stool in rectal tube pouch. Central nervous system: Alert and oriented. No focal neurological deficits. Extremities: no LEE   Data Reviewed: I have personally reviewed following labs and imaging studies  CBC: Recent Labs  Lab 05/09/22 0046 05/10/22 0037 05/11/22 0032 05/12/22 0022 05/13/22 0322  WBC 9.1 9.7 10.2 9.7 7.9  NEUTROABS 5.2 5.4 5.2 5.7 4.1  HGB 10.2* 10.2* 10.1* 9.7* 11.1*  HCT 29.9* 31.5* 32.2* 29.1* 34.5*  MCV 96.5 101.3* 102.9* 99.7 101.2*  PLT 217 242 251 248 227    Basic Metabolic Panel: Recent Labs  Lab 05/08/22 1700 05/09/22 0046 05/10/22 0037 05/11/22 0032  05/12/22 0022 05/13/22 0322  NA 132* 135 135 135 135 135  K 4.1 2.9* 4.6 4.6 3.7 4.5  CL 95* 96* 101 102 98 99  CO2 20* 26 21* 21* 25 23  GLUCOSE 75 103* 99 117* 93 89  BUN 28* 6* 15 24* 11 23  CREATININE 4.54* 2.22* 3.94* 5.45* 3.58* 5.05*  CALCIUM 10.1 9.3 10.1 10.1 9.2 9.6  MG  --  2.1 2.5* 2.5* 2.1 2.3  PHOS 3.7  --   --   --  2.2* 2.6    GFR: Estimated Creatinine Clearance: 10 mL/min (Gjon Letarte) (by C-G formula based on SCr of 5.05 mg/dL (H)).  Liver Function Tests: Recent Labs  Lab 05/08/22 1700 05/12/22 0022 05/13/22 0322  ALBUMIN 2.8*  2.7* 3.0*    CBG: No results for input(s): GLUCAP in the last 168 hours.   Recent Results (from the past 240 hour(s))  Resp Panel by RT-PCR (Flu Jhovanny Guinta&B, Covid) Nasopharyngeal Swab     Status: None   Collection Time: 05/06/22  7:05 AM   Specimen: Nasopharyngeal Swab; Nasopharyngeal(NP) swabs in vial transport medium  Result Value Ref Range Status   SARS Coronavirus 2 by RT PCR NEGATIVE NEGATIVE Final    Comment: (NOTE) SARS-CoV-2 target nucleic acids are NOT DETECTED.  The SARS-CoV-2 RNA is generally detectable in upper respiratory specimens during the acute phase of infection. The lowest concentration of SARS-CoV-2 viral copies this assay can detect is 138 copies/mL. Lore Polka negative result does not preclude SARS-Cov-2 infection and should not be used as the sole basis for treatment or other patient management decisions. Kazuko Clemence negative result may occur with  improper specimen collection/handling, submission of specimen other than nasopharyngeal swab, presence of viral mutation(s) within the areas targeted by this assay, and inadequate number of viral copies(<138 copies/mL). Jerad Dunlap negative result must be combined with clinical observations, patient history, and epidemiological information. The expected result is Negative.  Fact Sheet for Patients:  BloggerCourse.com  Fact Sheet for Healthcare Providers:   SeriousBroker.it  This test is no t yet approved or cleared by the [place] FDA and  has been authorized for detection and/or diagnosis of SARS-CoV-2 by FDA under an Emergency Use Authorization (EUA). This EUA will remain  in effect (meaning this test can be used) for the duration of the COVID-19 declaration under Section 564(b)(1) of the Act, 21 U.S.C.section 360bbb-3(b)(1), unless the authorization is terminated  or revoked sooner.       Influenza Jarron Curley by PCR NEGATIVE NEGATIVE Final   Influenza B by PCR NEGATIVE NEGATIVE Final    Comment: (NOTE) The Xpert Xpress SARS-CoV-2/FLU/RSV plus assay is intended as an aid in the diagnosis of influenza from Nasopharyngeal swab specimens and should not be used as Nakeda Lebron sole basis for treatment. Nasal washings and aspirates are unacceptable for Xpert Xpress SARS-CoV-2/FLU/RSV testing.  Fact Sheet for Patients: BloggerCourse.com  Fact Sheet for Healthcare Providers: SeriousBroker.it  This test is not yet approved or cleared by the [place] FDA and has been authorized for detection and/or diagnosis of SARS-CoV-2 by FDA under an Emergency Use Authorization (EUA). This EUA will remain in effect (meaning this test can be used) for the duration of the COVID-19 declaration under Section 564(b)(1) of the Act, 21 U.S.C. section 360bbb-3(b)(1), unless the authorization is terminated or revoked.  Performed at [facility], [address], [place], [place] [address]   Blood Culture (routine x 2)     Status: None   Collection Time: 05/06/22 10:00 AM   Specimen: BLOOD  Result Value Ref Range Status   Specimen Description BLOOD SITE NOT SPECIFIED  Final   Special Requests   Final    BOTTLES DRAWN AEROBIC AND ANAEROBIC Blood Culture results may not be optimal due to an inadequate volume of blood received in culture bottles   Culture   Final    NO GROWTH 5  DAYS Performed at [facility], [address], [place], [place] [address]    Report Status 05/11/2022 FINAL  Final  Blood Culture (routine x 2)     Status: None   Collection Time: 05/06/22 10:05 AM   Specimen: BLOOD  Result Value Ref Range Status   Specimen Description BLOOD SITE NOT SPECIFIED  Final  Special Requests   Final    BOTTLES DRAWN AEROBIC AND ANAEROBIC Blood Culture adequate volume   Culture   Final    NO GROWTH 5 DAYS Performed at [facility], [address], [place], [place] [address]    Report Status 05/11/2022 FINAL  Final  C Difficile Quick Screen w PCR reflex     Status: Abnormal   Collection Time: 05/07/22 12:58 AM   Specimen: STOOL  Result Value Ref Range Status   C Diff antigen POSITIVE (Cornie Mccomber) NEGATIVE Final   C Diff toxin NEGATIVE NEGATIVE Final   C Diff interpretation Results are indeterminate. See PCR results.  Final    Comment: Performed at [facility], [address] [place], [place] [address]  C. Diff by PCR, Reflexed     Status: Abnormal   Collection Time: 05/07/22 12:58 AM  Result Value Ref Range Status   Toxigenic C. Difficile by PCR POSITIVE (Gurnie Duris) NEGATIVE Final    Comment: Positive for toxigenic C. difficile with little to no toxin production. Only treat if clinical presentation suggests symptomatic illness. Performed at [facility], [address] [place], [place] [address]          Radiology Studies: No results found.      Scheduled Meds:  atorvastatin  80 mg Oral Daily   B-complex with vitamin C  1 tablet Oral Daily   And   folic acid  1 mg Oral Daily   Chlorhexidine Gluconate Cloth  6 each Topical Q0600   cinacalcet  30 mg Oral Q M,W,F-HD   clopidogrel  75 mg Oral Daily   escitalopram  10 mg Oral Daily   famotidine  20 mg Oral Daily   feeding supplement  237 mL Oral BID BM   heparin injection (subcutaneous)  5,000 Units Subcutaneous Q8H   liver oil-zinc oxide   Topical Q4H   metoprolol tartrate   12.5 mg Oral BID   midodrine  5 mg Oral TID WC   sodium chloride flush  3 mL Intravenous Q12H   [START ON 05/17/2022] vancomycin  125 mg Oral Q12H   vancomycin  125 mg Oral QID   Continuous Infusions:   ceFAZolin (ANCEF) IV 2 g (05/11/22 1610)     LOS: 7 days    Time spent: over 30 min    Lacretia Nicks, MD [facility]   To contact the attending provider between 7A-7P or the covering provider during after hours 7P-7A, please log into the web site [email or web address] and access using universal [place] password for that web site. If you do not have the password, please call the hospital operator.  05/13/2022, 5:47 PM

## 2022-05-13 NOTE — Progress Notes (Signed)
Pt picked up for dialysis, report given to dialysis RN. No distress noted.

## 2022-05-13 NOTE — Progress Notes (Signed)
OT Cancellation Note  Patient Details Name: [name] MRN: [id number] DOB: 06/12/1951   Cancelled Treatment:    Reason Eval/Treat Not Completed: Fatigue/lethargy limiting ability to participate Patient just assisted back to bed with nursing and too fatigued to participate. OT will follow back as time permits.   Corinne Ports E. Virlee Stroschein, OTR/L Acute Rehabilitation Services [phone] [place] 05/13/2022, 2:10 PM

## 2022-05-14 ENCOUNTER — Inpatient Hospital Stay (HOSPITAL_COMMUNITY): Payer: Medicare Other

## 2022-05-14 DIAGNOSIS — I214 Non-ST elevation (NSTEMI) myocardial infarction: Secondary | ICD-10-CM | POA: Diagnosis not present

## 2022-05-14 LAB — TROPONIN I (HIGH SENSITIVITY)
Troponin I (High Sensitivity): 495 ng/L (ref <18)
Troponin I (High Sensitivity): 808 ng/L (ref <18)

## 2022-05-14 LAB — CBC WITH DIFFERENTIAL/PLATELET
Abs Immature Granulocytes: 0.03 10*3/uL (ref 0.00–0.07)
Basophils Absolute: 0.1 10*3/uL (ref 0.0–0.1)
Basophils Relative: 1 %
Eosinophils Absolute: 0.4 10*3/uL (ref 0.0–0.5)
Eosinophils Relative: 5 %
HCT: 30.5 % — ABNORMAL LOW (ref 36.0–46.0)
Hemoglobin: 10.2 g/dL — ABNORMAL LOW (ref 12.0–15.0)
Immature Granulocytes: 0 %
Lymphocytes Relative: 22 %
Lymphs Abs: 1.6 10*3/uL (ref 0.7–4.0)
MCH: 33.6 pg (ref 26.0–34.0)
MCHC: 33.4 g/dL (ref 30.0–36.0)
MCV: 100.3 fL — ABNORMAL HIGH (ref 80.0–100.0)
Monocytes Absolute: 0.9 10*3/uL (ref 0.1–1.0)
Monocytes Relative: 11 %
Neutro Abs: 4.6 10*3/uL (ref 1.7–7.7)
Neutrophils Relative %: 61 %
Platelets: 218 10*3/uL (ref 150–400)
RBC: 3.04 MIL/uL — ABNORMAL LOW (ref 3.87–5.11)
RDW: 20.4 % — ABNORMAL HIGH (ref 11.5–15.5)
WBC: 7.6 10*3/uL (ref 4.0–10.5)
nRBC: 0 % (ref 0.0–0.2)

## 2022-05-14 LAB — RENAL FUNCTION PANEL
Albumin: 3 g/dL — ABNORMAL LOW (ref 3.5–5.0)
Anion gap: 11 (ref 5–15)
BUN: 10 mg/dL (ref 8–23)
CO2: 28 mmol/L (ref 22–32)
Calcium: 9.6 mg/dL (ref 8.9–10.3)
Chloride: 95 mmol/L — ABNORMAL LOW (ref 98–111)
Creatinine, Ser: 3.09 mg/dL — ABNORMAL HIGH (ref 0.44–1.00)
GFR, Estimated: 16 mL/min — ABNORMAL LOW (ref >60)
Glucose, Bld: 88 mg/dL (ref 70–99)
Phosphorus: 1.9 mg/dL — ABNORMAL LOW (ref 2.5–4.6)
Potassium: 4.1 mmol/L (ref 3.5–5.1)
Sodium: 134 mmol/L — ABNORMAL LOW (ref 135–145)

## 2022-05-14 LAB — MAGNESIUM: Magnesium: 2.1 mg/dL (ref 1.7–2.4)

## 2022-05-14 IMAGING — DX DG CHEST 2V
2 series · 2 of 2 positions shown · non-contrast
Comparison: 10/02/2021

CLINICAL DATA: Short of breath.

EXAM:
CHEST - 2 VIEW

[chest lat]
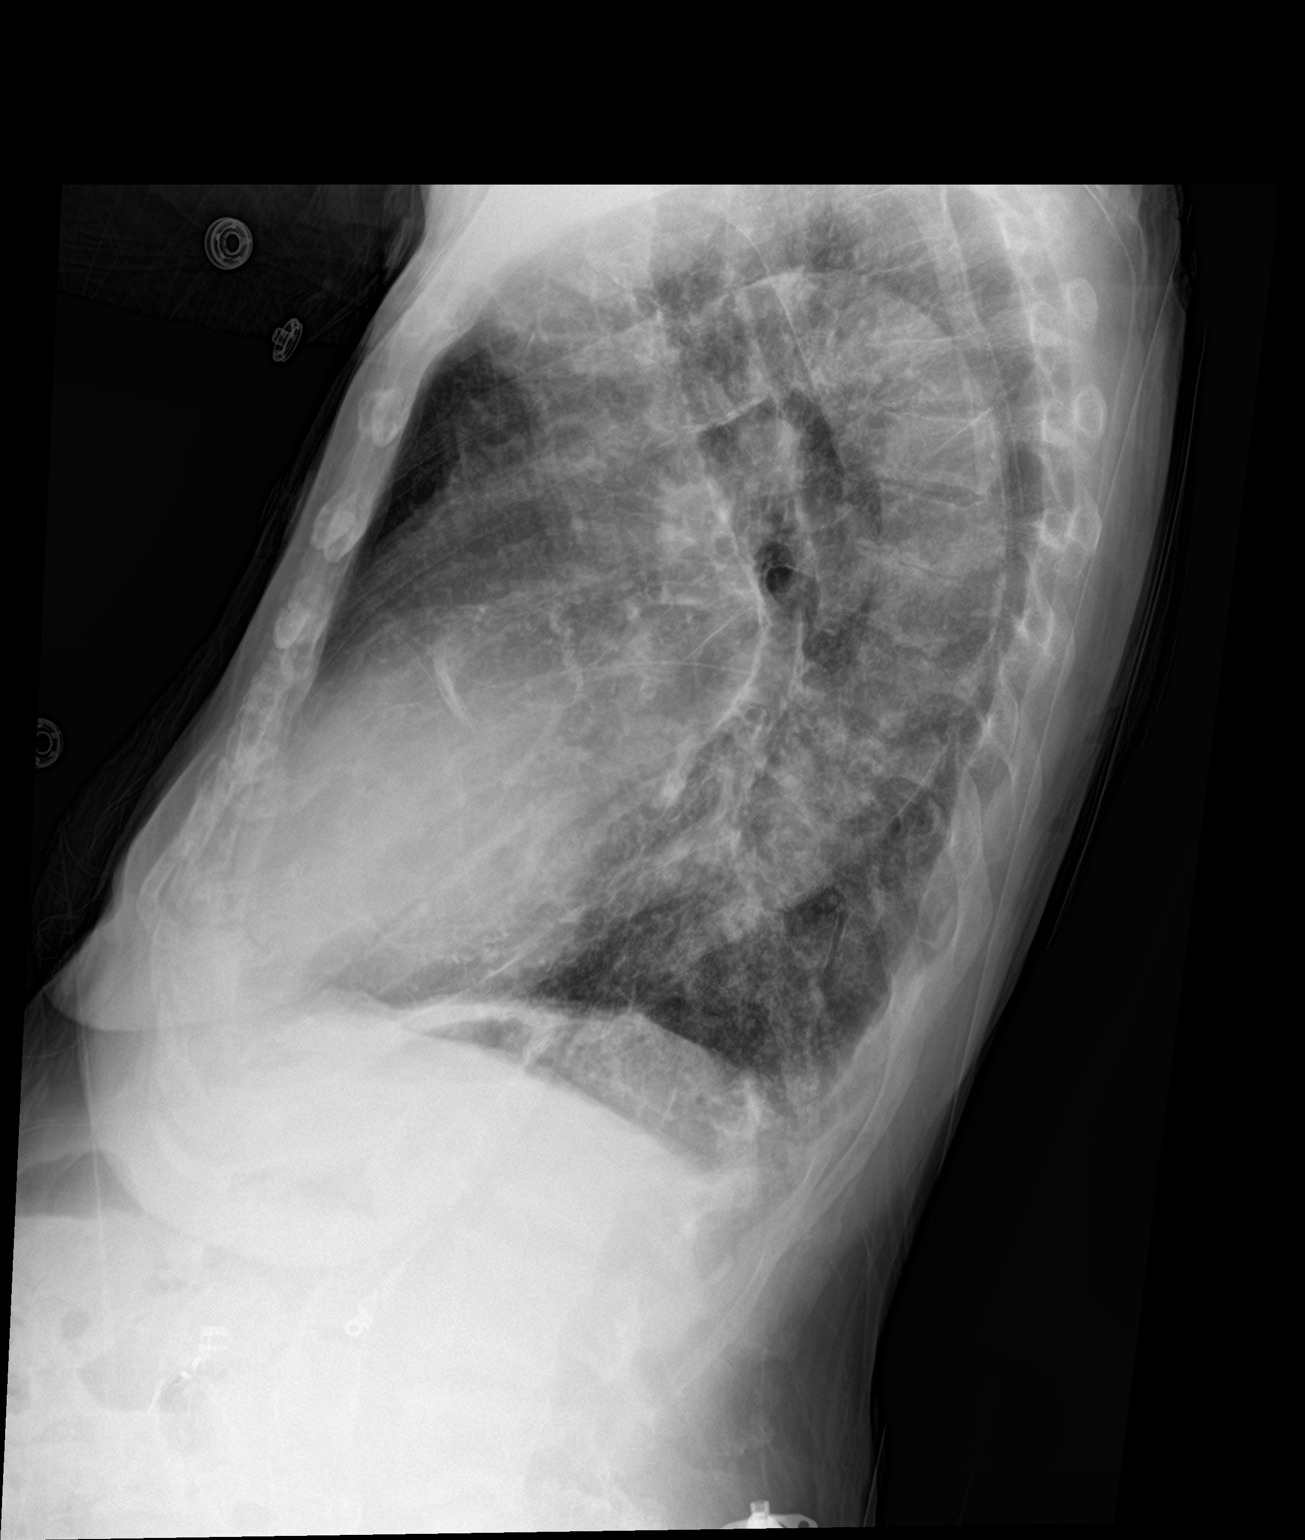

[chest ap]
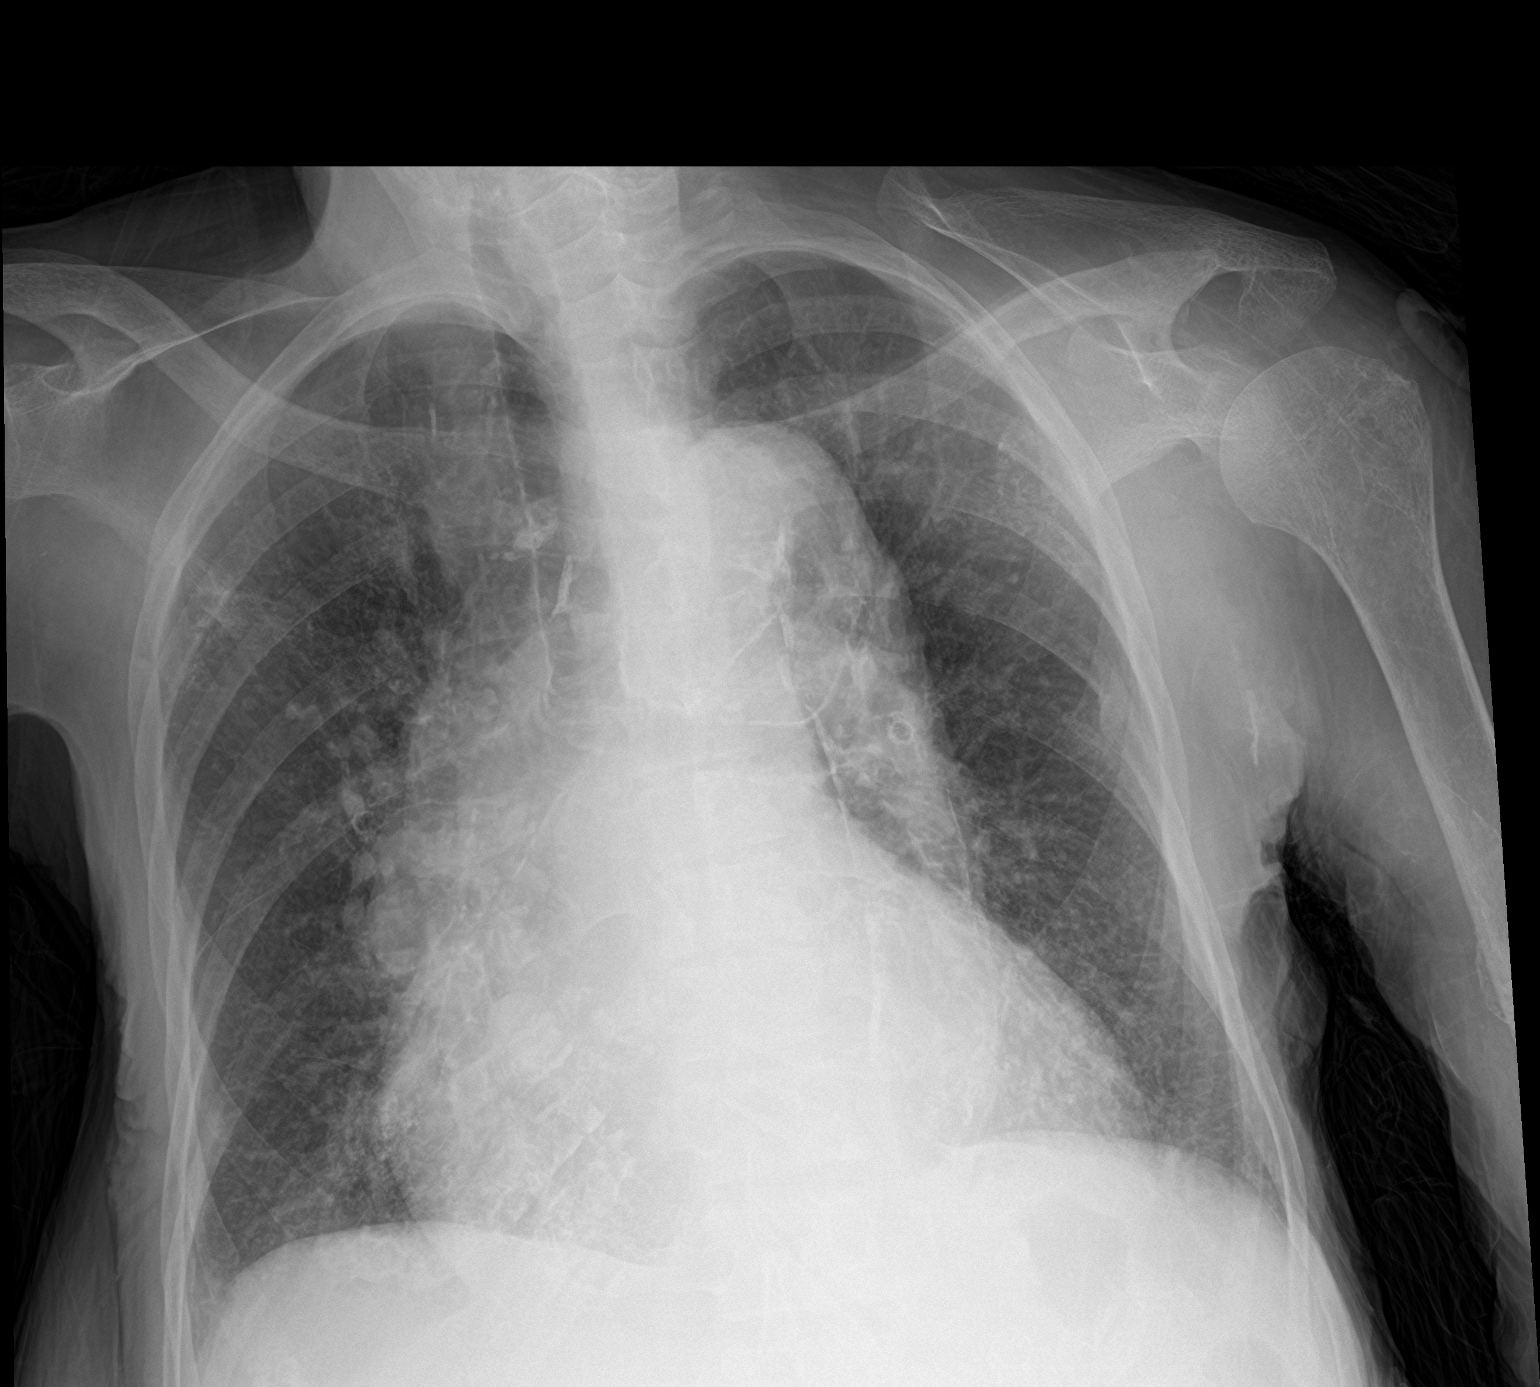

[2 of 2 positions shown; findings below may reference images not displayed]

FINDINGS: Cardiac enlargement. Tortuous aorta with coronary trace interstitial
thickening identified compatible with mild edema. No pleural
effusion or airspace consolidation. Osseous structures are intact.
IMPRESSION: Mild CHF.

## 2022-05-14 MED ORDER — K PHOS MONO-SOD PHOS DI & MONO 155-852-130 MG PO TABS
500.0000 mg | ORAL_TABLET | Freq: Two times a day (BID) | ORAL | Status: AC
Start: 2022-05-14 — End: 2022-05-14
  Administered 2022-05-14 (×2): 500 mg via ORAL
  Filled 2022-05-14 (×2): qty 2

## 2022-05-14 MED ORDER — CHLORHEXIDINE GLUCONATE CLOTH 2 % EX PADS
6.0000 | MEDICATED_PAD | Freq: Every day | CUTANEOUS | Status: DC
  Administered 2022-05-15 – 2022-05-21 (×6): 6 via TOPICAL

## 2022-05-14 NOTE — TOC Transition Note (Incomplete)
Transition of Care [facility]) - CM/SW Discharge Note   Patient Details  Name: ROLENE ANDRADES MRN: [id number] Date of Birth: [birth year]  Transition of Care [facility]) CM/SW Contact:  Tresa Endo Phone Number: 05/14/2022, 9:51 AM   Clinical Narrative:    Patient will DC to: [name] Anticipated DC date: 05/14/2022 Family notified: Pt Son Transport by: Corey Harold   Per MD patient ready for DC to [facility] RN to call report prior to discharge [phone]). RN, patient, patient's family, and facility notified of DC. Discharge Summary and FL2 sent to facility. DC packet on chart. Ambulance transport requested for patient.   CSW will sign off for now as social work intervention is no longer needed. Please consult [place] again if new needs arise.       Barriers to Discharge: Continued Medical Work up, Ship broker   Patient Goals and CMS Choice Patient states their goals for this hospitalization and ongoing recovery are:: return to SNF      Discharge Placement                       Discharge Plan and Services                                     Social Determinants of Health (SDOH) Interventions     Readmission Risk Interventions    11/11/2021    3:49 PM 08/18/2021    3:01 PM  Readmission Risk Prevention Plan  Transportation Screening Complete Complete  Medication Review Press photographer) Complete Complete  PCP or Specialist appointment within 3-5 days of discharge Complete Complete  HRI or Home Care Consult Complete Complete  SW Recovery Care/Counseling Consult Complete Complete  Palliative Care Screening Not Applicable Not Applicable  Skilled Nursing Facility Complete Complete

## 2022-05-14 NOTE — Progress Notes (Signed)
PROGRESS NOTE    [name]  KYH:[id number] DOB: 01-Dec-1951 DOA: 05/06/2022 PCP: Renford Dills, MD  Chief Complaint  Patient presents with   Shortness of Breath    Brief Narrative:  Ms. Frome is [name] 71 yo female with PMH dCHF, ESRD on HD, CVA, CAD, HLD, HTN, GERD who presented with shortness of breath.  She was recently hospitalized from 04/26/2022 until 05/04/2022 with septic shock due to staph bacteremia from infected AV graft.  She was discharged on Ancef to complete course followed by chronic oral suppression. On admission she was found to be somnolent, hypoxic, and volume overloaded. Further work-up revealed elevated troponin which further uptrended consistent with NSTEMI.  She was evaluated by cardiology and also started on Kameran Lallier heparin drip on admission.  She was not considered Lala Been good interventional candidate.    Assessment & Plan:   Principal Problem:   NSTEMI (non-ST elevated myocardial infarction) (HCC) Active Problems:   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HCC)   C. difficile diarrhea   End-stage renal disease on hemodialysis (HCC)   Dermatitis   Acute on chronic diastolic (congestive) heart failure (HCC)   Physical deconditioning   Assessment and Plan: * NSTEMI (non-ST elevated myocardial infarction) (HCC) - SOB and CP on admission; trop elevated with peak at 1,008 - s/p 48 hr of heparin drip completed - overnight 5/31-6/1 she c/o SOB/chest discomfort, like her chest was exploding (sx lasted about 30 min to 1 hour, now resolved).  EKG without acute ST-T wave changes, troponins rising.  Will discuss again with cardiology.  Notably had hypotension recorded x2 last night. - continue statin and BB - per cardiology, not currently an interventional candidate due to multiple medical issues currently  -Plavix monotherapy okay per cardiology  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HCC) - hospitalized 5/14 - 5/22; AV graft infection, MSSA bacteremia; evaluated by ID. Plan for Ancef post HD MWF  until 6/13 then oral doxy 100 mg BID (due to CDI while on Ancef) - continue Ancef  - appreciate ID evaluation after re-admission  - Follow-up repeat blood cultures obtained on 05/06/2022: NGTD x 5 days  C. difficile diarrhea - s/p recent diarrhea; C diff Ag positive, toxin negative, PCR positive - started on oral Vanc - per ID (5/25) continue oral vancomycin 125 mg QID x 10 days then 125 mg BID for 7 days after completion of IV abx - diarrhea improving, follow  End-stage renal disease on hemodialysis (HCC) - Continue chronic midodrine.  BP stable - Nephrology following, continue inpatient dialysis per nephrology  Dermatitis - skin breakdown from ongoing diarrhea - WOC consult appreciated - did not respond much to Gerhardt's cream, now on desitin trial   Acute on chronic diastolic (congestive) heart failure (HCC) - Volume removal planned with HD - CXR on admission consistent with volume overload  Physical deconditioning - Planning for discharge back to [facility] at discharge.  Will need insurance auth      DVT prophylaxis: heparin Code Status: DNR Family Communication: none at bedsdie Disposition:   Status is: Inpatient Remains inpatient appropriate because: continued diarrhea   Consultants:  ID Nephrology cardiology  Procedures:  none  Antimicrobials:  Anti-infectives (From admission, onward)    Start     Dose/Rate Route Frequency Ordered Stop   05/17/22 1000  vancomycin (VANCOCIN) 50 mg/mL oral solution SOLN 125 mg        125 mg Oral Every 12 hours 05/07/22 1314 06/03/22 0959   05/08/22 1800  ceFAZolin (ANCEF) IVPB 2g/100 mL premix  2 g 200 mL/hr over 30 Minutes Intravenous Every M-W-F (1800) 05/07/22 1016 05/26/22 2359   05/08/22 1200  ceFAZolin (ANCEF) IVPB 1 g/50 mL premix  Status:  Discontinued        1 g 100 mL/hr over 30 Minutes Intravenous Every M-W-F (Hemodialysis) 05/07/22 0935 05/07/22 1016   05/07/22 1030  vancomycin (VANCOCIN) capsule  125 mg        125 mg Oral 4 times daily 05/07/22 0935 05/17/22 0959   05/06/22 1800  vancomycin (VANCOREADY) IVPB 500 mg/100 mL  Status:  Discontinued        500 mg 100 mL/hr over 60 Minutes Intravenous Every M-W-F (Hemodialysis) 05/06/22 1218 05/07/22 0935   05/06/22 1800  ceFEPIme (MAXIPIME) 2 g in sodium chloride 0.9 % 100 mL IVPB  Status:  Discontinued        2 g 200 mL/hr over 30 Minutes Intravenous Every M-W-F (Hemodialysis) 05/06/22 1218 05/07/22 0935   05/06/22 1145  ceFEPIme (MAXIPIME) 2 g in sodium chloride 0.9 % 100 mL IVPB        2 g 200 mL/hr over 30 Minutes Intravenous  Once 05/06/22 1143 05/06/22 1324   05/06/22 1145  vancomycin (VANCOCIN) IVPB 1000 mg/200 mL premix        1,000 mg 200 mL/hr over 60 Minutes Intravenous  Once 05/06/22 1143 05/06/22 1436       Subjective: C/o chest pain and Sob last night   Objective: Vitals:   05/14/22 0846 05/14/22 1148 05/14/22 1258 05/14/22 1316  BP: (!) 88/73 (!) 166/57 122/65 122/88  Pulse: 85 83 76 76  Resp: 18     Temp: 98.3 F (36.8 C)     TempSrc: Oral     SpO2: 97%     Weight:      Height:        Intake/Output Summary (Last 24 hours) at 05/14/2022 1421 Last data filed at 05/14/2022 1035 Gross per 24 hour  Intake --  Output 702 ml  Net -702 ml   Filed Weights   05/11/22 1632 05/12/22 0500 05/14/22 0551  Weight: 58.5 kg 79.4 kg 79.8 kg    Examination:  General: No acute distress. Cardiovascular: RRR Lungs: unlabored Abdomen: Soft, nontender, nondistended  Neurological: Alert and oriented 3. Moves all extremities 4 . Cranial nerves II through XII grossly intact. Extremities: No clubbing or cyanosis. No edema.   Data Reviewed: I have personally reviewed following labs and imaging studies  CBC: Recent Labs  Lab 05/10/22 0037 05/11/22 0032 05/12/22 0022 05/13/22 0322 05/14/22 0559  WBC 9.7 10.2 9.7 7.9 7.6  NEUTROABS 5.4 5.2 5.7 4.1 4.6  HGB 10.2* 10.1* 9.7* 11.1* 10.2*  HCT 31.5* 32.2* 29.1* 34.5*  30.5*  MCV 101.3* 102.9* 99.7 101.2* 100.3*  PLT 242 251 248 227 218    Basic Metabolic Panel: Recent Labs  Lab 05/08/22 1700 05/09/22 0046 05/10/22 0037 05/11/22 0032 05/12/22 0022 05/13/22 0322 05/14/22 0559  NA 132*   < > 135 135 135 135 134*  K 4.1   < > 4.6 4.6 3.7 4.5 4.1  CL 95*   < > 101 102 98 99 95*  CO2 20*   < > 21* 21* 25 23 28   GLUCOSE 75   < > 99 117* 93 89 88  BUN 28*   < > 15 24* 11 23 10   CREATININE 4.54*   < > 3.94* 5.45* 3.58* 5.05* 3.09*  CALCIUM 10.1   < > 10.1 10.1 9.2 9.6 9.6  MG  --    < > 2.5* 2.5* 2.1 2.3 2.1  PHOS 3.7  --   --   --  2.2* 2.6 1.9*   < > = values in this interval not displayed.    GFR: Estimated Creatinine Clearance: 16.3 mL/min (Khaiden Segreto) (by C-G formula based on SCr of 3.09 mg/dL (H)).  Liver Function Tests: Recent Labs  Lab 05/08/22 1700 05/12/22 0022 05/13/22 0322 05/14/22 0559  ALBUMIN 2.8* 2.7* 3.0* 3.0*    CBG: No results for input(s): GLUCAP in the last 168 hours.   Recent Results (from the past 240 hour(s))  Resp Panel by RT-PCR (Flu Shamecka Hocutt&B, Covid) Nasopharyngeal Swab     Status: None   Collection Time: 05/06/22  7:05 AM   Specimen: Nasopharyngeal Swab; Nasopharyngeal(NP) swabs in vial transport medium  Result Value Ref Range Status   SARS Coronavirus 2 by RT PCR NEGATIVE NEGATIVE Final    Comment: (NOTE) SARS-CoV-2 target nucleic acids are NOT DETECTED.  The SARS-CoV-2 RNA is generally detectable in upper respiratory specimens during the acute phase of infection. The lowest concentration of SARS-CoV-2 viral copies this assay can detect is 138 copies/mL. Jandel Patriarca negative result does not preclude SARS-Cov-2 infection and should not be used as the sole basis for treatment or other patient management decisions. Cynia Abruzzo negative result may occur with  improper specimen collection/handling, submission of specimen other than nasopharyngeal swab, presence of viral mutation(s) within the areas targeted by this assay, and inadequate  number of viral copies(<138 copies/mL). Marvell Tamer negative result must be combined with clinical observations, patient history, and epidemiological information. The expected result is Negative.  Fact Sheet for Patients:  BloggerCourse.com  Fact Sheet for Healthcare Providers:  SeriousBroker.it  This test is no t yet approved or cleared by the [place] FDA and  has been authorized for detection and/or diagnosis of SARS-CoV-2 by FDA under an Emergency Use Authorization (EUA). This EUA will remain  in effect (meaning this test can be used) for the duration of the COVID-19 declaration under Section 564(b)(1) of the Act, 21 U.S.C.section 360bbb-3(b)(1), unless the authorization is terminated  or revoked sooner.       Influenza Camylle Whicker by PCR NEGATIVE NEGATIVE Final   Influenza B by PCR NEGATIVE NEGATIVE Final    Comment: (NOTE) The Xpert Xpress SARS-CoV-2/FLU/RSV plus assay is intended as an aid in the diagnosis of influenza from Nasopharyngeal swab specimens and should not be used as Saivon Prowse sole basis for treatment. Nasal washings and aspirates are unacceptable for Xpert Xpress SARS-CoV-2/FLU/RSV testing.  Fact Sheet for Patients: BloggerCourse.com  Fact Sheet for Healthcare Providers: SeriousBroker.it  This test is not yet approved or cleared by the [place] FDA and has been authorized for detection and/or diagnosis of SARS-CoV-2 by FDA under an Emergency Use Authorization (EUA). This EUA will remain in effect (meaning this test can be used) for the duration of the COVID-19 declaration under Section 564(b)(1) of the Act, 21 U.S.C. section 360bbb-3(b)(1), unless the authorization is terminated or revoked.  Performed at [facility], [address], [place], [place] [address]   Blood Culture (routine x 2)     Status: None   Collection Time: 05/06/22 10:00 AM   Specimen: BLOOD   Result Value Ref Range Status   Specimen Description BLOOD SITE NOT SPECIFIED  Final   Special Requests   Final    BOTTLES DRAWN AEROBIC AND ANAEROBIC Blood Culture results may not be optimal due to an inadequate volume of blood received  in culture bottles   Culture   Final    NO GROWTH 5 DAYS Performed at [facility], [address], [place], [place] [address]    Report Status 05/11/2022 FINAL  Final  Blood Culture (routine x 2)     Status: None   Collection Time: 05/06/22 10:05 AM   Specimen: BLOOD  Result Value Ref Range Status   Specimen Description BLOOD SITE NOT SPECIFIED  Final   Special Requests   Final    BOTTLES DRAWN AEROBIC AND ANAEROBIC Blood Culture adequate volume   Culture   Final    NO GROWTH 5 DAYS Performed at [facility], [address] [place], [place] [address]    Report Status 05/11/2022 FINAL  Final  C Difficile Quick Screen w PCR reflex     Status: Abnormal   Collection Time: 05/07/22 12:58 AM   Specimen: STOOL  Result Value Ref Range Status   C Diff antigen POSITIVE (Jobie Popp) NEGATIVE Final   C Diff toxin NEGATIVE NEGATIVE Final   C Diff interpretation Results are indeterminate. See PCR results.  Final    Comment: Performed at [facility], [address] [place], [place] [address]  C. Diff by PCR, Reflexed     Status: Abnormal   Collection Time: 05/07/22 12:58 AM  Result Value Ref Range Status   Toxigenic C. Difficile by PCR POSITIVE (Seven Dollens) NEGATIVE Final    Comment: Positive for toxigenic C. difficile with little to no toxin production. Only treat if clinical presentation suggests symptomatic illness. Performed at [facility], [address], [place], [place] [address]          Radiology Studies: DG CHEST PORT 1 VIEW  Result Date: 05/14/2022 CLINICAL DATA:  Shortness of breath Chest pain EXAM: PORTABLE CHEST - 1 VIEW COMPARISON:  05/06/2022 FINDINGS: Unchanged cardiomegaly. Interval improvement of pulmonary vascular  congestion. Diffusely calcified thoracic aorta. The lungs are clear. IMPRESSION: 1. Unchanged cardiomegaly. 2. Interval improvement of pulmonary vascular congestion. Electronically Signed   By: Acquanetta Belling M.D.   On: 05/14/2022 11:13        Scheduled Meds:  atorvastatin  80 mg Oral Daily   B-complex with vitamin C  1 tablet Oral Daily   And   folic acid  1 mg Oral Daily   Chlorhexidine Gluconate Cloth  6 each Topical Q0600   Chlorhexidine Gluconate Cloth  6 each Topical Q0600   clopidogrel  75 mg Oral Daily   escitalopram  10 mg Oral Daily   famotidine  20 mg Oral Daily   feeding supplement  237 mL Oral BID BM   heparin injection (subcutaneous)  5,000 Units Subcutaneous Q8H   liver oil-zinc oxide   Topical Q4H   metoprolol tartrate  12.5 mg Oral BID   midodrine  5 mg Oral TID WC   phosphorus  500 mg Oral BID   sodium chloride flush  3 mL Intravenous Q12H   [START ON 05/17/2022] vancomycin  125 mg Oral Q12H   vancomycin  125 mg Oral QID   Continuous Infusions:   ceFAZolin (ANCEF) IV 2 g (05/14/22 0008)     LOS: 8 days    Time spent: over 30 min    Lacretia Nicks, MD [facility]   To contact the attending provider between 7A-7P or the covering provider during after hours 7P-7A, please log into the web site [email or web address] and access using universal [place] password for that web site. If  you do not have the password, please call the hospital operator.  05/14/2022, 2:21 PM

## 2022-05-14 NOTE — [place] FL2 (Signed)
[place] MEDICAID FL2 LEVEL OF CARE SCREENING TOOL     IDENTIFICATION  Patient Name: [name] Birthdate: 11-30-1951 Sex: female Admission Date (Current Location): 05/06/2022  [facility] and [place] Number:  Producer, television/film/video and Address:  [facility] [place], [address] [place], [place] [address]      Provider Number: [id number]  Attending Physician Name and Address:  Zigmund Daniel., *  Relative Name and Phone Number:  Reuel Boom [phone]    Current Level of Care: Hospital Recommended Level of Care: Skilled Nursing Facility Prior Approval Number:    Date Approved/Denied:   PASRR Number: [id number]  Discharge Plan: SNF    Current Diagnoses: Patient Active Problem List   Diagnosis Date Noted   Dermatitis 05/10/2022   Physical deconditioning 05/07/2022   Hypervolemia associated with renal insufficiency 05/06/2022   DNR (do not resuscitate) 05/06/2022   Sepsis (HCC)    MSSA bacteremia    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HCC) 04/26/2022   Arteriovenous fistula, acquired (HCC) 02/19/2022   Cyclical vomiting syndrome 02/19/2022   Dysphagia 02/19/2022   Anemia of chronic renal failure 02/19/2022   Hypertensive heart and chronic kidney disease with heart failure and with stage 5 chronic kidney disease, or end stage renal disease (HCC) 02/19/2022   Mild cognitive impairment 02/19/2022   Major depression, single episode 02/19/2022   Chronic diastolic CHF (congestive heart failure) (HCC) 01/23/2022   Fall at home, initial encounter 01/23/2022   Acute on chronic diastolic (congestive) heart failure (HCC) 11/08/2021   GERD (gastroesophageal reflux disease)    Other disorders of calcium metabolism 10/17/2021   Protein-calorie malnutrition, severe 08/12/2021   Acute respiratory failure with hypoxia (HCC) 08/06/2021   Major depressive disorder, recurrent episode, moderate (HCC) 02/23/2021   Prolonged QT interval 02/21/2021   Allergy,  unspecified, initial encounter 08/08/2020   NSTEMI (non-ST elevated myocardial infarction) (HCC) 05/14/2020   Ventricular tachycardia (HCC) 03/15/2020   Seizure (HCC) 03/15/2020   Complication of vascular access for dialysis 12/21/2019   Breakdown (mechanical) of surgically created arteriovenous fistula, initial encounter (HCC) 12/18/2019   Weakness 10/11/2019   Aneurysm artery, 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 difficile diarrhea 04/17/2019   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   Primary generalized (osteo)arthritis 04/17/2019   Pure hypercholesterolemia, unspecified 04/17/2019   Secondary hyperparathyroidism of renal origin (HCC) 04/17/2019   Gastro-esophageal reflux disease without esophagitis 04/17/2019   Essential (primary) hypertension 04/17/2019   Hyperlipidemia, unspecified 04/17/2019   Transient cerebral ischemic attack, unspecified 04/17/2019   Prolonged Q-T interval on ECG 02/24/2017   Malnutrition of moderate degree 12/24/2016   Problem with dialysis access (HCC) 12/21/2016   Neurologic abnormality 11/19/2015   Anxiety 11/19/2015   Insomnia 11/19/2015   Gait instability    Dizziness 05/09/2015   Ataxia 05/09/2015   H/O: CVA (cerebrovascular accident) 05/09/2015   Left facial numbness 05/09/2015   Left leg numbness 05/09/2015   Hyperlipidemia    SOB (shortness of breath) 04/01/2013   HTN (hypertension) 04/01/2013   End-stage renal disease on hemodialysis (HCC) 11/07/2012   Dyspnea 12/31/2011    Orientation RESPIRATION BLADDER Height & Weight     Self, Time, Situation, Place  Normal   Weight: 176 lb (79.8 kg) Height:  5\' 2"  (157.5 cm)  BEHAVIORAL SYMPTOMS/MOOD NEUROLOGICAL  BOWEL NUTRITION STATUS      Continent Diet (See DC Summary)  AMBULATORY STATUS COMMUNICATION OF NEEDS Skin   Extensive Assist Verbally                         Personal Care Assistance Level of Assistance  Bathing, Feeding, Dressing Bathing Assistance: Maximum assistance Feeding assistance: Independent Dressing Assistance: Maximum assistance     Functional Limitations Info  Speech, Hearing, Sight Sight Info: Adequate Hearing Info: Adequate Speech Info: Adequate    SPECIAL CARE FACTORS FREQUENCY  PT (By licensed PT), OT (By licensed OT)     PT Frequency: 5x a week OT Frequency: 5x a week            Contractures Contractures Info: Not present    Additional Factors Info  Code Status, Allergies Code Status Info: DNR Allergies Info: Sulfa Antibiotics, Adhesive (tape)           Current Medications (05/14/2022):  This is the current hospital active medication list Current Facility-Administered Medications  Medication Dose Route Frequency Provider Last Rate Last Admin   acetaminophen (TYLENOL) tablet 650 mg  650 mg Oral Q6H PRN Jonah Blue, MD   650 mg at 05/12/22 0037   Or   acetaminophen (TYLENOL) suppository 650 mg  650 mg Rectal Q6H PRN Jonah Blue, MD       albuterol (PROVENTIL) (2.5 MG/3ML) 0.083% nebulizer solution 2.5 mg  2.5 mg Nebulization Q6H PRN Jonah Blue, MD       atorvastatin (LIPITOR) tablet 80 mg  80 mg Oral Daily Jonah Blue, MD   80 mg at 05/13/22 0830   B-complex with vitamin C tablet 1 tablet  1 tablet Oral Daily Jonah Blue, MD   1 tablet at 05/13/22 0830   And   folic acid (FOLVITE) tablet 1 mg  1 mg Oral Daily Jonah Blue, MD   1 mg at 05/13/22 0830   camphor-menthol (SARNA) lotion 1 application.  1 application. Topical Q8H PRN Jonah Blue, MD       And   hydrOXYzine (ATARAX) tablet 25 mg  25 mg Oral Q8H PRN Jonah Blue, MD   25 mg at 05/07/22 0108   ceFAZolin (ANCEF) IVPB 2g/100 mL premix  2 g Intravenous Q  M,W,F-1800 Danelle Earthly, MD 200 mL/hr at 05/14/22 0008 2 g at 05/14/22 0008   Chlorhexidine Gluconate Cloth 2 % PADS 6 each  6 each Topical Q0600 Lenny Pastel, PA-C   6 each at 05/14/22 0549   Chlorhexidine Gluconate Cloth 2 % PADS 6 each  6 each Topical Q0600 Lenny Pastel, PA-C       clopidogrel (PLAVIX) tablet 75 mg  75 mg Oral Daily Jonah Blue, MD   75 mg at 05/13/22 0830   dextromethorphan (DELSYM) 30 MG/5ML liquid 15 mg  15 mg Oral BID PRN Lewie Chamber, MD       docusate sodium (ENEMEEZ) enema 283 mg  1 enema Rectal PRN Jonah Blue, MD       escitalopram Judye Bos) tablet 10 mg  10 mg Oral Daily Jonah Blue, MD   10 mg at 05/13/22 0830   famotidine (PEPCID) tablet 20 mg  20 mg Oral Daily Lewie Chamber, MD   20 mg at 05/13/22 0830   feeding supplement (ENSURE ENLIVE / ENSURE PLUS) liquid 237 mL  237 mL Oral BID BM Jonah Blue, MD   237 mL at 05/13/22 1401   feeding supplement (NEPRO CARB  STEADY) liquid 237 mL  237 mL Oral TID PRN Jonah Blue, MD   237 mL at 05/10/22 1106   heparin injection 5,000 Units  5,000 Units Subcutaneous Q8H Gerilyn Nestle, RPH   5,000 Units at 05/14/22 0547   hydrALAZINE (APRESOLINE) injection 5 mg  5 mg Intravenous Q4H PRN Jonah Blue, MD       liver oil-zinc oxide (DESITIN) 40 % ointment   Topical Q4H Lewie Chamber, MD   Given at 05/14/22 [phone]   metoprolol tartrate (LOPRESSOR) tablet 12.5 mg  12.5 mg Oral BID Marcelino Duster, PA   12.5 mg at 05/11/22 1017   midodrine (PROAMATINE) tablet 5 mg  5 mg Oral TID WC Jonah Blue, MD   5 mg at 05/13/22 1224   ondansetron (ZOFRAN) tablet 4 mg  4 mg Oral Q6H PRN Jonah Blue, MD       Or   ondansetron [facility]) injection 4 mg  4 mg Intravenous Q6H PRN Jonah Blue, MD   4 mg at 05/09/22 1036   phosphorus (K PHOS NEUTRAL) tablet 500 mg  500 mg Oral BID Lenny Pastel, PA-C       sodium chloride flush (NS) 0.9 % injection 3 mL  3 mL Intravenous Steva Colder, MD   3 mL at  05/14/22 0013   sorbitol 70 % solution 30 mL  30 mL Oral PRN Jonah Blue, MD       [START ON 05/17/2022] vancomycin (VANCOCIN) 50 mg/mL oral solution SOLN 125 mg  125 mg Oral Q12H Danelle Earthly, MD       vancomycin (VANCOCIN) capsule 125 mg  125 mg Oral QID Danelle Earthly, MD   125 mg at 05/14/22 0003   zolpidem (AMBIEN) tablet 5 mg  5 mg Oral QHS PRN Jonah Blue, MD   5 mg at 05/14/22 0003     Discharge Medications: Please see discharge summary for a list of discharge medications.  Relevant Imaging Results:  Relevant Lab Results:   Additional Information [id number] at [facility] on MWF. Pt arrives at 11:40 am for 11:55 am chair time.  Daekwon Beswick Wynn Banker, LCSWA

## 2022-05-14 NOTE — Progress Notes (Signed)
Subjective: Eating breakfast in room, no current complaints, dialysis yesterday 700 cc UF, said tolerated but did not have chest discomfort after, noted lowish BP asymptomatic.  Stool frequency has subsided greatly .  Objective Vital signs in last 24 hours: Vitals:   05/14/22 0401 05/14/22 0417 05/14/22 0551 05/14/22 0846  BP: (!) 81/61 (!) 114/53  (!) 88/73  Pulse: (!) 104 98  85  Resp: '19 18  18  '$ Temp: 97.8 F (36.6 C)   98.3 F (36.8 C)  TempSrc: Oral   Oral  SpO2: 100%   97%  Weight:   79.8 kg   Height:       Weight change:   Physical Exam: General: Alert elderly female chronically ill pleasant, NAD Heart: RRR no MRG Lungs: CTA, unlabored breathing Abdomen: NABS, soft NTND Extremities: No pedal edema, right upper arm AV Gore-Tex graft at wound, healing nicely and improving Dialysis Access: RUA AVGG + bruit, (scabbing area healing) 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  Now appearing euvolemic /700 cc yesterday UF on hemo-BP drop tomorrow keep even on HD.  Bed weights no help at all with noted 20 kg gain 58.5 kg = 5/29 to  79.4 kg = 5/30 to  79.8 =6/01  C Diff + - per PMD has required rectal tube because of copious diarrhea/improving plan for admit NSTEMI - troponin elevated. cardiology following. per cardiology not currently interventional candidate due to multiple medical issues/Plavix monotherapy okay No CP this AM.  Recent MSSA sepsis/ AVG infection -plan continue IV cefazolin until 6/13 with plans for oral Doxy 100 mg twice daily (due to CDI while on Ancef )thereafter for suppression/ ID reeval noted per admit ESRD - on HD MWF.  AVG required resection during last admit but still functional.  Dialysis  tomorrow on schedule 6/02 BP/ volume -currently does not appear volume overloaded, has had problems with low blood pressures using midodrine.   wts Questionable  as above keep even on dialysis tomorrow if here Anemia esrd -Hgb 10.1> 9.7 > 11.1 > 10.2 this a.m. no ESA this admission was getting every 4 weeks as OP   Follow HGB.  Will hold ESA unless she drops again tomorrow BMD ckd - C Ca high, phosphorus 2.6 yesterday and 1.9 this a.m. hold ing hectorol with ^ ca //yesterday stopped Renvela with low phosphorus we will give p.o. supplement today/ also hold Sensipar (review op PTH recent=96<186<726) follow-up lab trend Nutrition 3.0 ALB renal diet w/fluid restrictions.  continue supplements Excoriation of buttocks: Rx per wound care consult Dementia/debility= at baseline and at [facility] for SNF  Ernest Haber, PA-C [place] [phone] 05/14/2022,9:19 AM  LOS: 8 days   Labs: Basic Metabolic Panel: Recent Labs  Lab 05/12/22 0022 05/13/22 0322 05/14/22 0559  NA 135 135 134*  K 3.7 4.5 4.1  CL 98 99 95*  CO2 '25 23 28  '$ GLUCOSE 93 89 88  BUN '11 23 10  '$ CREATININE 3.58* 5.05* 3.09*  CALCIUM 9.2 9.6 9.6  PHOS 2.2* 2.6 1.9*   Liver Function Tests: Recent Labs  Lab 05/12/22 0022 05/13/22 0322 05/14/22 0559  ALBUMIN 2.7* 3.0* 3.0*   No results for input(s): LIPASE, AMYLASE in the last 168 hours. No results for input(s): AMMONIA in the last 168 hours. CBC: Recent Labs  Lab 05/10/22 0037 05/11/22 0032 05/12/22 0022 05/13/22 0322 05/14/22 0559  WBC 9.7 10.2 9.7 7.9 7.6  NEUTROABS 5.4 5.2 5.7 4.1 4.6  HGB 10.2* 10.1* 9.7* 11.1* 10.2*  HCT 31.5* 32.2* 29.1* 34.5* 30.5*  MCV 101.3* 102.9* 99.7 101.2* 100.3*  PLT 242 251 248 227 218   Cardiac Enzymes: No results for input(s): CKTOTAL, CKMB, CKMBINDEX, TROPONINI in the last 168 hours. CBG: No results for input(s): GLUCAP in the last 168 hours.  Studies/Results: No results found. Medications:    ceFAZolin (ANCEF) IV 2 g (05/14/22 0008)    atorvastatin  80 mg Oral Daily   B-complex with vitamin C  1 tablet Oral Daily   And   folic acid  1 mg Oral Daily   Chlorhexidine Gluconate Cloth  6 each Topical Q0600   cinacalcet  30 mg Oral Q M,W,F-HD   clopidogrel  75 mg Oral Daily   escitalopram  10 mg Oral Daily   famotidine  20 mg Oral Daily   feeding supplement  237 mL Oral BID BM   heparin injection (subcutaneous)  5,000 Units Subcutaneous Q8H   liver oil-zinc oxide   Topical Q4H   metoprolol tartrate  12.5 mg Oral BID   midodrine  5 mg Oral TID WC   sodium chloride flush  3 mL Intravenous Q12H   [START ON 05/17/2022] vancomycin  125 mg Oral Q12H   vancomycin  125 mg Oral QID

## 2022-05-14 NOTE — Progress Notes (Signed)
Physical Therapy Treatment Patient Details Name: [name] MRN: [id number] DOB: 09-29-1951 Today's Date: 05/14/2022   History of Present Illness Pt is a 71 y.o. female admitted 05/06/22 with SOB and infection. 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 S/p RUE AVG excision with revision 5/15. Course complicated by hypotension perioperatively; ETT 5/15-5/16. Workup for septic shock secondary to MRSA bacteremia. PMH includes ESRD (HD MWF), CKD, CHF, HTN, HLD.    PT Comments    Pt received sidelying sleeping but easily woken and agreeable to session. Pt apologizing for and stating tiredness throughout session as well as abruptly coming to sit from standing throughout. Session limited secondary to pt requesting back to bed and pt stated fatigue. Pt able to come to sitting and maintain sitting balance with BUE on chair in front of pt for extended time this session with min assist. Pt able to come to standing with mod down to min assist x5 with pt abruptly sitting each time despite cues to continue standing with longest standing trial ~30 seconds. Pt continues to benefit from skilled PT services to progress toward functional mobility goals.    Recommendations for follow up therapy are one component of a multi-disciplinary discharge planning process, led by the attending physician.  Recommendations may be updated based on patient status, additional functional criteria and insurance authorization.  Follow Up Recommendations  Skilled nursing-short term rehab (<3 hours/day)     Assistance Recommended at Discharge Frequent or constant Supervision/Assistance  Patient can return home with the following A lot of help with bathing/dressing/bathroom;Assistance with cooking/housework;Direct supervision/assist for medications management;Direct supervision/assist for financial management;Assist for transportation;Help with stairs or ramp for entrance;A  lot of help with walking and/or transfers   Equipment Recommendations  None recommended by PT    Recommendations for Other Services OT consult     Precautions / Restrictions Precautions Precautions: Fall Restrictions Weight Bearing Restrictions: No     Mobility  Bed Mobility Overal bed mobility: Needs Assistance Bed Mobility: Supine to Sit, Sit to Supine Rolling: Min assist   Supine to sit: Mod assist Sit to supine: Min guard   General bed mobility comments: mod a to elevate trunk, pt with stong posterior push/lean    Transfers Overall transfer level: Needs assistance Equipment used: Rolling walker (2 wheels) Transfers: Sit to/from Stand Sit to Stand: Mod assist, Min assist           General transfer comment: mod down to min a to come to standing with RW with practice x5 during session    Ambulation/Gait               General Gait Details: unable to ambulate prior to sitting back down despite cues   Stairs             Wheelchair Mobility    Modified Rankin (Stroke Patients Only)       Balance Overall balance assessment: Needs assistance Sitting-balance support: Feet supported Sitting balance-Leahy Scale: Poor Sitting balance - Comments: pt lateral lean R to L and anterior/posterior Postural control: Posterior lean Standing balance support: Bilateral upper extremity supported Standing balance-Leahy Scale: Poor Standing balance comment: stood for brief time this session, contineus to sit abruptly                            Cognition Arousal/Alertness: Lethargic Behavior During Therapy: WFL for tasks assessed/performed Overall Cognitive Status: No family/caregiver present to  determine baseline cognitive functioning                                 General Comments: pt stating and apologizing for being tired        Exercises Other Exercises Other Exercises: standing marching x20 alternating ea side     General Comments General comments (skin integrity, edema, etc.): VSS on RA      Pertinent Vitals/Pain Pain Assessment Pain Assessment: Faces Faces Pain Scale: Hurts a little bit Pain Location: bottom Pain Descriptors / Indicators: Grimacing, Discomfort Pain Intervention(s): Limited activity within patient's tolerance, Monitored during session    Home Living                          Prior Function            PT Goals (current goals can now be found in the care plan section) Acute Rehab PT Goals PT Goal Formulation: With patient Time For Goal Achievement: 05/22/22    Frequency    Min 2X/week      PT Plan      Co-evaluation              AM-PAC PT "6 Clicks" Mobility   Outcome Measure  Help needed turning from your back to your side while in a flat bed without using bedrails?: A Lot Help needed moving from lying on your back to sitting on the side of a flat bed without using bedrails?: A Lot Help needed moving to and from a bed to a chair (including a wheelchair)?: A Lot Help needed standing up from a chair using your arms (e.g., wheelchair or bedside chair)?: A Lot Help needed to walk in hospital room?: Total Help needed climbing 3-5 steps with a railing? : Total 6 Click Score: 10    End of Session   Activity Tolerance: Patient limited by fatigue;Other (comment) (limited by cognition) Patient left: in bed;with call bell/phone within reach;Other (comment) (with OT present) Nurse Communication: Mobility status PT Visit Diagnosis: Unsteadiness on feet (R26.81);Other abnormalities of gait and mobility (R26.89);Muscle weakness (generalized) (M62.81);Difficulty in walking, not elsewhere classified (R26.2)     Time: 1308-6578 PT Time Calculation (min) (ACUTE ONLY): 19 min  Charges:  $Therapeutic Activity: 8-22 mins                     Diamond Martucci R. PTA Acute Rehabilitation Services Office: [phone]    Catalina Antigua 05/14/2022, 1:52 PM

## 2022-05-14 NOTE — Progress Notes (Signed)
Asked to weigh in again on management of coronary artery disease.  She had angina last night, associated with shortness of breath, lasting for roughly 45 minutes.  ECG performed during pain did not show acute ST segment or T wave changes, but cardiac troponin increased slightly from 500-800.  She was able to walk this morning with physical therapy without chest discomfort.  Roughly 15 minutes after laying back in bed she had a single brief episode of nonsustained VT for 9 beats.  Currently she is completely asymptomatic, but feels very tired.  She denies dyspnea or chest discomfort.  She is oriented and aware of the seriousness of her medical condition.  She appears cachectic.  She does not have any respiratory difficulty lying fully supine in bed (she appears to be euvolemic from a left heart failure point of view), but has very prominent jugular venous distention and prominent V waves due to known tricuspid insufficiency.  Good thrill and bruit over her right forearm fistula, large old fistula in the left arm area.  The diagnosis of coronary artery disease is not in doubt, based on the extent of coronary artery calcification on non-- dedicated imaging studies and the wealth of problems with peripheral arterial disease as well as multiple risk factors.  In fact, the suspicion is that she has multivessel coronary artery disease as a cause for her diastolic heart failure.  For example, the noncontrast chest CT performed on 04/26/2022 shows severe calcification of all 3 coronary arteries (the entire trajectory of the right coronary artery looks like a full jacket of calcium) as well as circumferential calcification of the entire thoracic aorta.  She has previously declined coronary angiography and did so again during our discussion today.  Moreover, if we were to perform coronary angiography is highly likely that we would find extensive multivessel CAD that would not be amenable to percutaneous  revascularization, but rather would require surgical revascularization (for which she is by no means a candidate) or medical therapy (which we are attempting currently.  Recovering from AV graft infection and Staph aureus bacteremia.  Still has issues with C. difficile infection, hypotension requiring midodrine (which limits the use of antianginal medications).  It is very likely that she has additional myocardium that is ischemic and at risk.  It is also likely that she will continue to have episodes of angina pectoris with "piecemeal" non-STEMI for weeks to come, which may be triggered by periods of severe hypotension or volume overload.  The best option remains chronic treatment with antiplatelet therapy (clopidogrel), beta-blocker in the highest dose tolerated by blood pressure, high-dose statin.  Blood pressure will not allow addition of long-acting nitrates, but she can be administered sublingual nitroglycerin as needed.  No room for calcium channel blockers and renal function precludes use of Ranexa.  She reaffirms desire for DNR status.  Her prognosis is poor.  Palliative care evaluation is appropriate.  Sanda Klein, MD, [facility] [phone] office [phone] pager

## 2022-05-14 NOTE — Progress Notes (Signed)
Occupational Therapy Treatment Patient Details Name: [name] MRN: [id number] DOB: [birth year] Today's Date: 05/14/2022   History of present illness Pt is a 71 y.o. female admitted 05/06/22 with SOB and infection. 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 S/p RUE AVG excision with revision 5/15. Course complicated by hypotension perioperatively; ETT 5/15-5/16. Workup for septic shock secondary to MRSA bacteremia. PMH includes ESRD (HD MWF), CKD, CHF, HTN, HLD.   OT comments  Pt reported they required to use BSC and already had Bm. Pt was able to complete upine to sitting with moderate assistance with min guard to min assist while sitting at EOB due to decrease in sitting balance. Pt required pivot transfer with mod assist to [facility], total assist for hygiene in standing and mod assist with FW to side step to bed. Pt needs required skill Occupational Therapy to maximize independence.    Recommendations for follow up therapy are one component of a multi-disciplinary discharge planning process, led by the attending physician.  Recommendations may be updated based on patient status, additional functional criteria and insurance authorization.    Follow Up Recommendations  Skilled nursing-short term rehab (<3 hours/day)    Assistance Recommended at Discharge Frequent or constant Supervision/Assistance  Patient can return home with the following  Two people to help with walking and/or transfers;A lot of help with bathing/dressing/bathroom;Assistance with cooking/housework;Direct supervision/assist for medications management;Direct supervision/assist for financial management;Assist for transportation;Help with stairs or ramp for entrance;Assistance with feeding   Equipment Recommendations  Other (comment) (TBD)    Recommendations for Other Services      Precautions / Restrictions Precautions Precautions: Fall Restrictions Weight  Bearing Restrictions: No       Mobility Bed Mobility Overal bed mobility: Needs Assistance Bed Mobility: Supine to Sit, Sit to Supine Rolling: Min assist   Supine to sit: Mod assist Sit to supine: Min guard        Transfers Overall transfer level: Needs assistance Equipment used: Rolling walker (2 wheels) Transfers: Sit to/from Stand Sit to Stand: Mod assist Stand pivot transfers: Mod assist               Balance Overall balance assessment: Needs assistance Sitting-balance support: Feet supported Sitting balance-Leahy Scale: Poor Sitting balance - Comments: pt lateral lean R to L and anterior/posterior   Standing balance support: Bilateral upper extremity supported Standing balance-Leahy Scale: Poor                             ADL either performed or assessed with clinical judgement   ADL Overall ADL's : Needs assistance/impaired Eating/Feeding: Bed level;Minimal assistance;Moderate assistance   Grooming: Wash/dry hands;Wash/dry face;Set up;Bed level   Upper Body Bathing: Moderate assistance;Sitting   Lower Body Bathing: Sit to/from stand;Total assistance   Upper Body Dressing : Moderate assistance;Sitting   Lower Body Dressing: Total assistance   Toilet Transfer: Moderate assistance;BSC/3in1;Rolling walker (2 wheels)   Toileting- Clothing Manipulation and Hygiene: Total assistance              Extremity/Trunk Assessment Upper Extremity Assessment Upper Extremity Assessment: Generalized weakness   Lower Extremity Assessment Lower Extremity Assessment: Generalized weakness        Vision   Vision Assessment?: No apparent visual deficits   Perception     Praxis      Cognition Arousal/Alertness: Lethargic Behavior During Therapy: WFL for tasks assessed/performed Overall Cognitive Status: No family/caregiver present to  determine baseline cognitive functioning                                 General Comments: pt  reported how they need to get onto the buss in session        Exercises      Shoulder Instructions       General Comments      Pertinent Vitals/ Pain       Pain Assessment Pain Assessment: Faces Faces Pain Scale: Hurts a little bit Pain Location: bottom Pain Descriptors / Indicators: Grimacing, Discomfort Pain Intervention(s): Limited activity within patient's tolerance, Monitored during session, Repositioned  Home Living                                          Prior Functioning/Environment              Frequency  Min 2X/week        Progress Toward Goals  OT Goals(current goals can now be found in the care plan section)  Progress towards OT goals: Progressing toward goals  Acute Rehab OT Goals OT Goal Formulation: Patient unable to participate in goal setting Time For Goal Achievement: 05/21/22 Potential to Achieve Goals: Fair ADL Goals Pt Will Perform Grooming: with supervision;sitting Pt Will Perform Lower Body Bathing: with min assist;sitting/lateral leans;with adaptive equipment Pt Will Perform Upper Body Dressing: sitting;with supervision Pt Will Perform Lower Body Dressing: with min assist;with adaptive equipment;sitting/lateral leans Pt Will Transfer to Toilet: with mod assist;stand pivot transfer;bedside commode Pt Will Perform Toileting - Clothing Manipulation and hygiene: with mod assist;sit to/from stand;sitting/lateral leans Additional ADL Goal #1: Patient will be able to sit EOB for 3-5 minutes with min gaurd assist to be able to complete ADLs. Additional ADL Goal #2: Patient will demonstrate increased alert affect to follow commands consistently in OT session without cues for reorientation.  Plan Discharge plan remains appropriate    Co-evaluation                 AM-PAC OT "6 Clicks" Daily Activity     Outcome Measure   Help from another person eating meals?: A Little Help from another person taking care of  personal grooming?: A Little Help from another person toileting, which includes using toliet, bedpan, or urinal?: Total Help from another person bathing (including washing, rinsing, drying)?: A Lot Help from another person to put on and taking off regular upper body clothing?: A Lot Help from another person to put on and taking off regular lower body clothing?: Total 6 Click Score: 12    End of Session Equipment Utilized During Treatment: Rolling walker (2 wheels);Gait belt  OT Visit Diagnosis: Other abnormalities of gait and mobility (R26.89);Muscle weakness (generalized) (M62.81);Other symptoms and signs involving cognitive function   Activity Tolerance Patient limited by fatigue   Patient Left in bed;with call bell/phone within reach;with nursing/sitter in room   Nurse Communication Mobility status        Time: 5102-5852 OT Time Calculation (min): 31 min  Charges: OT General Charges $OT Visit: 1 Visit OT Treatments $Self Care/Home Management : 23-37 mins  Joeseph Amor OTR/L  Acute Rehab Services  [phone] office number [phone] pager number   Joeseph Amor 05/14/2022, 1:09 PM

## 2022-05-15 ENCOUNTER — Encounter (HOSPITAL_COMMUNITY)
Admission: EM | Disposition: A | Discharge status: Skilled Nursing Facility | Payer: Self-pay | Source: Skilled Nursing Facility | Attending: Family Medicine

## 2022-05-15 ENCOUNTER — Other Ambulatory Visit: Payer: Self-pay

## 2022-05-15 ENCOUNTER — Inpatient Hospital Stay (HOSPITAL_COMMUNITY): Payer: Medicare Other | Admitting: Anesthesiology

## 2022-05-15 ENCOUNTER — Encounter (HOSPITAL_COMMUNITY): Payer: Self-pay | Admitting: Internal Medicine

## 2022-05-15 DIAGNOSIS — I252 Old myocardial infarction: Secondary | ICD-10-CM

## 2022-05-15 DIAGNOSIS — I132 Hypertensive heart and chronic kidney disease with heart failure and with stage 5 chronic kidney disease, or end stage renal disease: Secondary | ICD-10-CM

## 2022-05-15 DIAGNOSIS — N186 End stage renal disease: Secondary | ICD-10-CM

## 2022-05-15 DIAGNOSIS — L7632 Postprocedural hematoma of skin and subcutaneous tissue following other procedure: Secondary | ICD-10-CM

## 2022-05-15 DIAGNOSIS — I5032 Chronic diastolic (congestive) heart failure: Secondary | ICD-10-CM

## 2022-05-15 DIAGNOSIS — I2510 Atherosclerotic heart disease of native coronary artery without angina pectoris: Secondary | ICD-10-CM

## 2022-05-15 DIAGNOSIS — T82590A Other mechanical complication of surgically created arteriovenous fistula, initial encounter: Secondary | ICD-10-CM

## 2022-05-15 DIAGNOSIS — N185 Chronic kidney disease, stage 5: Secondary | ICD-10-CM

## 2022-05-15 DIAGNOSIS — I214 Non-ST elevation (NSTEMI) myocardial infarction: Secondary | ICD-10-CM | POA: Diagnosis not present

## 2022-05-15 DIAGNOSIS — T82898A Other specified complication of vascular prosthetic devices, implants and grafts, initial encounter: Secondary | ICD-10-CM

## 2022-05-15 DIAGNOSIS — Z992 Dependence on renal dialysis: Secondary | ICD-10-CM

## 2022-05-15 DIAGNOSIS — T8131XA Disruption of external operation (surgical) wound, not elsewhere classified, initial encounter: Secondary | ICD-10-CM

## 2022-05-15 HISTORY — PX: AV FISTULA PLACEMENT: SHX1204

## 2022-05-15 LAB — CBC WITH DIFFERENTIAL/PLATELET
Abs Immature Granulocytes: 0.04 10*3/uL (ref 0.00–0.07)
Basophils Absolute: 0.1 10*3/uL (ref 0.0–0.1)
Basophils Relative: 1 %
Eosinophils Absolute: 0.5 10*3/uL (ref 0.0–0.5)
Eosinophils Relative: 8 %
HCT: 29.2 % — ABNORMAL LOW (ref 36.0–46.0)
Hemoglobin: 9.3 g/dL — ABNORMAL LOW (ref 12.0–15.0)
Immature Granulocytes: 1 %
Lymphocytes Relative: 27 %
Lymphs Abs: 1.6 10*3/uL (ref 0.7–4.0)
MCH: 32.2 pg (ref 26.0–34.0)
MCHC: 31.8 g/dL (ref 30.0–36.0)
MCV: 101 fL — ABNORMAL HIGH (ref 80.0–100.0)
Monocytes Absolute: 0.9 10*3/uL (ref 0.1–1.0)
Monocytes Relative: 15 %
Neutro Abs: 3 10*3/uL (ref 1.7–7.7)
Neutrophils Relative %: 48 %
Platelets: 214 10*3/uL (ref 150–400)
RBC: 2.89 MIL/uL — ABNORMAL LOW (ref 3.87–5.11)
RDW: 20.3 % — ABNORMAL HIGH (ref 11.5–15.5)
WBC: 6.1 10*3/uL (ref 4.0–10.5)
nRBC: 0 % (ref 0.0–0.2)

## 2022-05-15 LAB — RENAL FUNCTION PANEL
Albumin: 3 g/dL — ABNORMAL LOW (ref 3.5–5.0)
Anion gap: 14 (ref 5–15)
BUN: 21 mg/dL (ref 8–23)
CO2: 24 mmol/L (ref 22–32)
Calcium: 9.4 mg/dL (ref 8.9–10.3)
Chloride: 97 mmol/L — ABNORMAL LOW (ref 98–111)
Creatinine, Ser: 4.75 mg/dL — ABNORMAL HIGH (ref 0.44–1.00)
GFR, Estimated: 9 mL/min — ABNORMAL LOW (ref >60)
Glucose, Bld: 88 mg/dL (ref 70–99)
Phosphorus: 2.8 mg/dL (ref 2.5–4.6)
Potassium: 4.5 mmol/L (ref 3.5–5.1)
Sodium: 135 mmol/L (ref 135–145)

## 2022-05-15 LAB — MAGNESIUM: Magnesium: 2.1 mg/dL (ref 1.7–2.4)

## 2022-05-15 SURGERY — INSERTION OF ARTERIOVENOUS (AV) GORE-TEX GRAFT ARM
Anesthesia: General | Site: Arm Upper | Laterality: Right

## 2022-05-15 MED ORDER — CEFAZOLIN SODIUM-DEXTROSE 1-4 GM/50ML-% IV SOLN
1.0000 g | Freq: Once | INTRAVENOUS | Status: AC
  Administered 2022-05-15: 1 g via INTRAVENOUS

## 2022-05-15 MED ORDER — FENTANYL CITRATE (PF) 250 MCG/5ML IJ SOLN
INTRAMUSCULAR | Status: DC | PRN
  Administered 2022-05-15 (×2): 50 ug via INTRAVENOUS

## 2022-05-15 MED ORDER — STERILE WATER FOR IRRIGATION IR SOLN
Status: DC | PRN
  Administered 2022-05-15: 1000 mL

## 2022-05-15 MED ORDER — CHLORHEXIDINE GLUCONATE 0.12 % MT SOLN: 15.0000 mL | Freq: Once | OROMUCOSAL | Status: AC

## 2022-05-15 MED ORDER — CEFAZOLIN SODIUM-DEXTROSE 1-4 GM/50ML-% IV SOLN
INTRAVENOUS | Status: AC
  Filled 2022-05-15: qty 50

## 2022-05-15 MED ORDER — ALTEPLASE 2 MG IJ SOLR: 2.0000 mg | Freq: Once | INTRAMUSCULAR | Status: DC | PRN

## 2022-05-15 MED ORDER — EPHEDRINE 5 MG/ML INJ
INTRAVENOUS | Status: AC
  Filled 2022-05-15: qty 10

## 2022-05-15 MED ORDER — CHLORHEXIDINE GLUCONATE 0.12 % MT SOLN
OROMUCOSAL | Status: AC
  Administered 2022-05-15: 15 mL via OROMUCOSAL
  Filled 2022-05-15: qty 15

## 2022-05-15 MED ORDER — OXYCODONE-ACETAMINOPHEN 5-325 MG PO TABS: 1.0000 | ORAL_TABLET | ORAL | Status: DC | PRN

## 2022-05-15 MED ORDER — ORAL CARE MOUTH RINSE: 15.0000 mL | Freq: Once | OROMUCOSAL | Status: AC

## 2022-05-15 MED ORDER — LIDOCAINE 2% (20 MG/ML) 5 ML SYRINGE
INTRAMUSCULAR | Status: AC
  Filled 2022-05-15: qty 5

## 2022-05-15 MED ORDER — LIDOCAINE 2% (20 MG/ML) 5 ML SYRINGE
INTRAMUSCULAR | Status: DC | PRN
  Administered 2022-05-15: 50 mg via INTRAVENOUS

## 2022-05-15 MED ORDER — ANTICOAGULANT SODIUM CITRATE 4% (200MG/5ML) IV SOLN: 5.0000 mL | Status: DC | PRN

## 2022-05-15 MED ORDER — DEXAMETHASONE SODIUM PHOSPHATE 10 MG/ML IJ SOLN
INTRAMUSCULAR | Status: DC | PRN
  Administered 2022-05-15: 5 mg via INTRAVENOUS

## 2022-05-15 MED ORDER — PHENYLEPHRINE HCL-NACL 20-0.9 MG/250ML-% IV SOLN
INTRAVENOUS | Status: DC | PRN
  Administered 2022-05-15: 50 ug/min via INTRAVENOUS

## 2022-05-15 MED ORDER — ONDANSETRON HCL 4 MG/2ML IJ SOLN
INTRAMUSCULAR | Status: DC | PRN
  Administered 2022-05-15: 4 mg via INTRAVENOUS

## 2022-05-15 MED ORDER — PENTAFLUOROPROP-TETRAFLUOROETH EX AERO
1.0000 | INHALATION_SPRAY | CUTANEOUS | Status: DC | PRN
Start: 2022-05-15 — End: 2022-05-15

## 2022-05-15 MED ORDER — FENTANYL CITRATE (PF) 250 MCG/5ML IJ SOLN
INTRAMUSCULAR | Status: AC
  Filled 2022-05-15: qty 5

## 2022-05-15 MED ORDER — PHENYLEPHRINE 80 MCG/ML (10ML) SYRINGE FOR IV PUSH (FOR BLOOD PRESSURE SUPPORT)
PREFILLED_SYRINGE | INTRAVENOUS | Status: AC
Start: 2022-05-15 — End: ?
  Filled 2022-05-15: qty 20

## 2022-05-15 MED ORDER — SUCCINYLCHOLINE CHLORIDE 200 MG/10ML IV SOSY
PREFILLED_SYRINGE | INTRAVENOUS | Status: DC | PRN
  Administered 2022-05-15: 80 mg via INTRAVENOUS

## 2022-05-15 MED ORDER — PHENYLEPHRINE 80 MCG/ML (10ML) SYRINGE FOR IV PUSH (FOR BLOOD PRESSURE SUPPORT)
PREFILLED_SYRINGE | INTRAVENOUS | Status: DC | PRN
  Administered 2022-05-15: 80 ug via INTRAVENOUS

## 2022-05-15 MED ORDER — LIDOCAINE HCL (PF) 1 % IJ SOLN: 5.0000 mL | INTRAMUSCULAR | Status: DC | PRN

## 2022-05-15 MED ORDER — SUCCINYLCHOLINE CHLORIDE 200 MG/10ML IV SOSY
PREFILLED_SYRINGE | INTRAVENOUS | Status: AC
  Filled 2022-05-15: qty 10

## 2022-05-15 MED ORDER — VANCOMYCIN HCL 125 MG PO CAPS
125.0000 mg | ORAL_CAPSULE | Freq: Two times a day (BID) | ORAL | Status: DC
Start: 2022-05-17 — End: 2022-05-18
  Administered 2022-05-17 – 2022-05-18 (×3): 125 mg via ORAL
  Filled 2022-05-15 (×4): qty 1

## 2022-05-15 MED ORDER — FENTANYL CITRATE (PF) 100 MCG/2ML IJ SOLN: 25.0000 ug | INTRAMUSCULAR | Status: DC | PRN

## 2022-05-15 MED ORDER — PROPOFOL 10 MG/ML IV BOLUS
INTRAVENOUS | Status: AC
  Filled 2022-05-15: qty 20

## 2022-05-15 MED ORDER — HEPARIN 6000 UNIT IRRIGATION SOLUTION
Status: DC | PRN
  Administered 2022-05-15: 1

## 2022-05-15 MED ORDER — SODIUM CHLORIDE 0.9 % IV SOLN: INTRAVENOUS | Status: DC

## 2022-05-15 MED ORDER — HEPARIN SODIUM (PORCINE) 1000 UNIT/ML DIALYSIS
1000.0000 [IU] | INTRAMUSCULAR | Status: DC | PRN
Start: 2022-05-15 — End: 2022-05-15

## 2022-05-15 MED ORDER — EPHEDRINE SULFATE-NACL 50-0.9 MG/10ML-% IV SOSY
PREFILLED_SYRINGE | INTRAVENOUS | Status: DC | PRN
  Administered 2022-05-15: 10 mg via INTRAVENOUS

## 2022-05-15 MED ORDER — PROPOFOL 10 MG/ML IV BOLUS
INTRAVENOUS | Status: DC | PRN
  Administered 2022-05-15: 70 mg via INTRAVENOUS

## 2022-05-15 MED ORDER — LIDOCAINE-PRILOCAINE 2.5-2.5 % EX CREA: 1.0000 "application " | TOPICAL_CREAM | CUTANEOUS | Status: DC | PRN

## 2022-05-15 MED ORDER — DARBEPOETIN ALFA 40 MCG/0.4ML IJ SOSY
40.0000 ug | PREFILLED_SYRINGE | Freq: Once | INTRAMUSCULAR | Status: AC
  Administered 2022-05-16: 40 ug via INTRAVENOUS

## 2022-05-15 MED ORDER — CEFAZOLIN SODIUM-DEXTROSE 1-4 GM/50ML-% IV SOLN
1.0000 g | INTRAVENOUS | Status: DC
  Administered 2022-05-15 – 2022-05-17 (×3): 1 g via INTRAVENOUS
  Filled 2022-05-15 (×5): qty 50

## 2022-05-15 MED ORDER — 0.9 % SODIUM CHLORIDE (POUR BTL) OPTIME
TOPICAL | Status: DC | PRN
  Administered 2022-05-15: 1000 mL

## 2022-05-15 SURGICAL SUPPLY — 43 items
ADH SKN CLS APL DERMABOND .7 (GAUZE/BANDAGES/DRESSINGS) ×1
ARMBAND PINK RESTRICT EXTREMIT (MISCELLANEOUS) ×4 IMPLANT
BAG COUNTER SPONGE SURGICOUNT (BAG) ×2 IMPLANT
BAG SPNG CNTER NS LX DISP (BAG) ×1
CANISTER SUCT 3000ML PPV (MISCELLANEOUS) ×2 IMPLANT
CANNULA VESSEL 3MM 2 BLNT TIP (CANNULA) ×2 IMPLANT
CLIP VESOCCLUDE MED 6/CT (CLIP) ×2 IMPLANT
CLIP VESOCCLUDE SM WIDE 6/CT (CLIP) ×2 IMPLANT
DECANTER SPIKE VIAL GLASS SM (MISCELLANEOUS) ×2 IMPLANT
DERMABOND ADVANCED (GAUZE/BANDAGES/DRESSINGS) ×1
DERMABOND ADVANCED .7 DNX12 (GAUZE/BANDAGES/DRESSINGS) ×1 IMPLANT
DRSG TEGADERM 4X4.75 (GAUZE/BANDAGES/DRESSINGS) ×1 IMPLANT
ELECT REM PT RETURN 9FT ADLT (ELECTROSURGICAL) ×2
ELECTRODE REM PT RTRN 9FT ADLT (ELECTROSURGICAL) ×1 IMPLANT
GAUZE SPONGE 2X2 8PLY STRL LF (GAUZE/BANDAGES/DRESSINGS) IMPLANT
GLOVE BIO SURGEON STRL SZ7 (GLOVE) ×1 IMPLANT
GLOVE BIO SURGEON STRL SZ7.5 (GLOVE) ×2 IMPLANT
GLOVE BIOGEL PI IND STRL 7.0 (GLOVE) IMPLANT
GLOVE BIOGEL PI IND STRL 7.5 (GLOVE) IMPLANT
GLOVE BIOGEL PI IND STRL 8 (GLOVE) ×1 IMPLANT
GLOVE BIOGEL PI INDICATOR 7.0 (GLOVE) ×1
GLOVE BIOGEL PI INDICATOR 7.5 (GLOVE) ×1
GLOVE BIOGEL PI INDICATOR 8 (GLOVE) ×1
GLOVE ECLIPSE 7.0 STRL STRAW (GLOVE) ×1 IMPLANT
GLOVE SURG POLY ORTHO LF SZ7.5 (GLOVE) ×1 IMPLANT
GLOVE SURG UNDER LTX SZ8 (GLOVE) ×2 IMPLANT
GOWN STRL REUS W/ TWL LRG LVL3 (GOWN DISPOSABLE) ×3 IMPLANT
GOWN STRL REUS W/TWL LRG LVL3 (GOWN DISPOSABLE) ×8
KIT BASIN OR (CUSTOM PROCEDURE TRAY) ×2 IMPLANT
KIT TURNOVER KIT B (KITS) ×2 IMPLANT
NS IRRIG 1000ML POUR BTL (IV SOLUTION) ×2 IMPLANT
PACK CV ACCESS (CUSTOM PROCEDURE TRAY) ×2 IMPLANT
PAD ARMBOARD 7.5X6 YLW CONV (MISCELLANEOUS) ×4 IMPLANT
SPONGE GAUZE 2X2 8PLY STRL LF (GAUZE/BANDAGES/DRESSINGS) ×1 IMPLANT
SPONGE GAUZE 2X2 STER 10/PKG (GAUZE/BANDAGES/DRESSINGS) ×1
SUT ETHILON 3 0 PS 1 (SUTURE) ×1 IMPLANT
SUT MNCRL AB 4-0 PS2 18 (SUTURE) ×3 IMPLANT
SUT PROLENE 6 0 BV (SUTURE) ×3 IMPLANT
SUT VIC AB 3-0 SH 27 (SUTURE) ×2
SUT VIC AB 3-0 SH 27X BRD (SUTURE) ×2 IMPLANT
TOWEL GREEN STERILE (TOWEL DISPOSABLE) ×2 IMPLANT
UNDERPAD 30X36 HEAVY ABSORB (UNDERPADS AND DIAPERS) ×2 IMPLANT
WATER STERILE IRR 1000ML POUR (IV SOLUTION) ×2 IMPLANT

## 2022-05-15 NOTE — Anesthesia Preprocedure Evaluation (Addendum)
Anesthesia Evaluation  Patient identified by MRN, date of birth, ID band Patient awake    Reviewed: Allergy & Precautions, NPO status , Patient's Chart, lab work & pertinent test results, reviewed documented beta blocker date and time , Unable to perform ROS - Chart review only  History of Anesthesia Complications Negative for: history of anesthetic complications  Airway Mallampati: II  TM Distance: >3 FB Neck ROM: Full    Dental  (+) Dental Advisory Given, Poor Dentition, Missing, Chipped   Pulmonary shortness of breath, pneumonia, former smoker,    Pulmonary exam normal breath sounds clear to auscultation       Cardiovascular hypertension, pulmonary hypertension+ CAD, + Past MI, + Peripheral Vascular Disease and +CHF  Normal cardiovascular exam Rhythm:Regular Rate:Normal  Echo 04/2022 1. No left ventricular thrombus is seen. Left ventricular ejection fraction, by estimation, is 50 to 55%. The left ventricle has low normal function. The left ventricle demonstrates regional wall motion abnormalities (see scoring diagram/findings for description). There is moderate hypokinesis of the left ventricular, entire inferolateral wall.  2. The right ventricular size is moderately enlarged. There is severely elevated pulmonary artery systolic pressure.  3. Left atrial size was moderately dilated.  4. Right atrial size was moderately dilated.  5. Mild to moderate mitral valve regurgitation. No evidence of mitral stenosis.  6. Tricuspid valve regurgitation is moderate to severe.  7. The aortic valve is tricuspid. There is moderate calcification of the  aortic valve. There is moderate thickening of the aortic valve. Aortic valve regurgitation is trivial. Aortic valve sclerosis/calcification is present, without any evidence of aortic stenosis.  8. The inferior vena cava is dilated in size with <50% respiratory variability, suggesting right  atrial pressure of 15 mmHg.    Neuro/Psych  Headaches, Seizures -,  PSYCHIATRIC DISORDERS Anxiety Depression TIACVA    GI/Hepatic Neg liver ROS, GERD  ,  Endo/Other  negative endocrine ROS  Renal/GU Dialysis and ESRFRenal diseaseDialysis AV fistula infection  negative genitourinary   Musculoskeletal  (+) Arthritis ,   Abdominal   Peds  Hematology  (+) Blood dyscrasia, anemia ,   Anesthesia Other Findings Day of surgery medications reviewed with patient.  Reproductive/Obstetrics negative OB ROS                            Anesthesia Physical  Anesthesia Plan  ASA: 4 and emergent  Anesthesia Plan: General   Post-op Pain Management: Minimal or no pain anticipated   Induction: Intravenous, Rapid sequence and Cricoid pressure planned  PONV Risk Score and Plan: 3 and Treatment may vary due to age or medical condition, Ondansetron and Dexamethasone  Airway Management Planned: Oral ETT  Additional Equipment: None  Intra-op Plan:   Post-operative Plan: Extubation in OR  Informed Consent: I have reviewed the patients History and Physical, chart, labs and discussed the procedure including the risks, benefits and alternatives for the proposed anesthesia with the patient or authorized representative who has indicated his/her understanding and acceptance.   Patient has DNR.  Discussed DNR with patient and Suspend DNR.   Dental advisory given  Plan Discussed with: CRNA  Anesthesia Plan Comments:        Anesthesia Quick Evaluation

## 2022-05-15 NOTE — Transfer of Care (Signed)
Immediate Anesthesia Transfer of Care Note  Patient: [name]  Procedure(s) Performed: EXPLORATION  OF  RIGHT ARM ARTERIOVENOUS GORE-TEX GRAFT (Right: Arm Upper)  Patient Location: PACU  Anesthesia Type:General  Level of Consciousness: awake, alert  and oriented  Airway & Oxygen Therapy: Patient Spontanous Breathing  Post-op Assessment: Report given to RN and Post -op Vital signs reviewed and stable  Post vital signs: Reviewed and stable  Last Vitals:  Vitals Value Taken Time  BP 98/43 05/15/22 1517  Temp    Pulse 84 05/15/22 1522  Resp 7 05/15/22 1522  SpO2 92 % 05/15/22 1522  Vitals shown include unvalidated device data.  Last Pain:  Vitals:   05/15/22 1323  TempSrc: Oral  PainSc:          Complications: No notable events documented.

## 2022-05-15 NOTE — Op Note (Signed)
    NAME: [name]    MRN: [id number] DOB: 21-Nov-1951    DATE OF OPERATION: 05/15/2022  PREOP DIAGNOSIS:    Wound dehiscence adjacent to left arm graft  POSTOP DIAGNOSIS:    Same  PROCEDURE:    Exploration of left arm graft and evacuation of hematoma  SURGEON: Judeth Cornfield. Scot Dock, MD  ASSIST: Luisa Dago, PA  ANESTHESIA: General  EBL: Minimal  INDICATIONS:    [name] is a 71 y.o. female who had revision of a right arm graft after she developed bleeding from a degenerative segment.  I bypassed around the segment and remove the degenerative segment.  This wound was closed with interrupted nylons.  Today her dressing was removed and this wound has separated with underlying hematoma.  She was taken to the operating room for exploration.  FINDINGS:   The hematoma was evacuated.  The graft did not appear to be exposed.  TECHNIQUE:   The patient was taken to the operating room and received a general anesthetic.  The right arm was prepped and draped in usual sterile fashion.  I removed the remaining nylon sutures from the wound that had dehisced.  I then washed out the hematoma.  There was no active bleeding.  There was no exposed graft.  I closed the superior aspect of the incision with 3 interrupted 3 oh nylons to protect the graft.  The distal aspect was packed with a 2 x 2 soaked in normal saline.  A sterile dressing was applied.  The patient tolerated the procedure well was transferred to recovery room in stable condition.  All needle and sponge counts were correct.  [name], MD, FACS [facility]  DATE OF DICTATION:   05/15/2022

## 2022-05-15 NOTE — Plan of Care (Signed)
  Problem: Clinical Measurements: Goal: Will remain free from infection Outcome: Not Progressing   Problem: Clinical Measurements: Goal: Respiratory complications will improve Outcome: Not Progressing   Problem: Clinical Measurements: Goal: Cardiovascular complication will be avoided Outcome: Not Progressing   Problem: Nutrition: Goal: Adequate nutrition will be maintained Outcome: Not Progressing   Problem: Elimination: Goal: Will not experience complications related to bowel motility Outcome: Not Progressing   Problem: Skin Integrity: Goal: Risk for impaired skin integrity will decrease Outcome: Not Progressing

## 2022-05-15 NOTE — Progress Notes (Signed)
Subjective:  Seen in HD unit. Upon arrival to the unit found to have new area of ulceration, oozing to distal portion of RUE AV graft.    Objective Vital signs in last 24 hours: Vitals:   05/14/22 2049 05/15/22 0603 05/15/22 0759 05/15/22 0952  BP: (!) 98/49 (!) 97/50 (!) 105/54 (!) 97/55  Pulse: 77 82 75 75  Resp: '16 16 17 16  '$ Temp: 98.4 F (36.9 C) 98 F (36.7 C) 97.6 F (36.4 C) (!) 96 F (35.6 C)  TempSrc: Oral Oral Oral   SpO2: 100% 100% 98% 98%  Weight:      Height:        Physical Exam: General: Elderly woman, chronically ill appearing  Heart: RRR no MRG Lungs: CTA, unlabored breathing Abdomen: soft non tender.  Extremities: No sig LE edema  Dialysis Access: RUE AVG with clean incisions to upper portion; distal portion with new ulceration, oozing    OP Dialysis Orders: [facility]  4h  350/600  45kg   2/2 bath  P2 R AVG  --Hep none - mircera 50 mcg IV q 4 weeks - hectorol 2 ug IV tiw - sensipar 30 tiw po   Assessment/Plan:   ESRD - HD MWF.  Unable to dialyze today. See #2 AVG malfunction. S/p revision of R AV graft 5/15. Dr. Scot Dock. Today presents with new area of ulceration. Vascular surgery has been consulted. Appears will need further revision in OR.  Acute hypoxic respiratory failure: Improved with HD. Appears euvolemic. On RA.  NSTEMI - t cardiology following --not currently interventional candidate due to comorbidities.  Recent MSSA sepsis/ AVG infection -plan continue IV cefazolin until 6/13 with plans for oral Doxy 100 mg twice daily (due to CDI while on Ancef )thereafter for suppression.  BP/ volume - BP/volume ok. Occasional hypotension on HD.  Anemia - Hgb 9.3. Resume ESA with next HD.  BMD ckd - Corrected Ca elevated. Holding VDRA/Sensipar here. Phos low. No binder for now   C Diff + - on PO Vanc - per primary  Nutrition. Renal diet w/fluid restrictions.  continue supplements Dementia.   Lynnda Child PA-C [facility] 05/15/2022,12:22 PM   Labs: Basic Metabolic Panel: Recent Labs  Lab 05/13/22 0322 05/14/22 0559 05/15/22 0653  NA 135 134* 135  K 4.5 4.1 4.5  CL 99 95* 97*  CO2 '23 28 24  '$ GLUCOSE 89 88 88  BUN '23 10 21  '$ CREATININE 5.05* 3.09* 4.75*  CALCIUM 9.6 9.6 9.4  PHOS 2.6 1.9* 2.8    Liver Function Tests: Recent Labs  Lab 05/13/22 0322 05/14/22 0559 05/15/22 0653  ALBUMIN 3.0* 3.0* 3.0*    No results for input(s): LIPASE, AMYLASE in the last 168 hours. No results for input(s): AMMONIA in the last 168 hours. CBC: Recent Labs  Lab 05/11/22 0032 05/12/22 0022 05/13/22 0322 05/14/22 0559 05/15/22 0653  WBC 10.2 9.7 7.9 7.6 6.1  NEUTROABS 5.2 5.7 4.1 4.6 3.0  HGB 10.1* 9.7* 11.1* 10.2* 9.3*  HCT 32.2* 29.1* 34.5* 30.5* 29.2*  MCV 102.9* 99.7 101.2* 100.3* 101.0*  PLT 251 248 227 218 214    Cardiac Enzymes: No results for input(s): CKTOTAL, CKMB, CKMBINDEX, TROPONINI in the last 168 hours. CBG: No results for input(s): GLUCAP in the last 168 hours.  Studies/Results: DG CHEST PORT 1 VIEW  Result Date: 05/14/2022 CLINICAL DATA:  Shortness of breath Chest pain EXAM: PORTABLE CHEST - 1 VIEW COMPARISON:  05/06/2022 FINDINGS: Unchanged cardiomegaly. Interval improvement of  pulmonary vascular congestion. Diffusely calcified thoracic aorta. The lungs are clear. IMPRESSION: 1. Unchanged cardiomegaly. 2. Interval improvement of pulmonary vascular congestion. Electronically Signed   By: Miachel Roux M.D.   On: 05/14/2022 11:13   Medications:  anticoagulant sodium citrate      ceFAZolin (ANCEF) IV 2 g (05/14/22 0008)    atorvastatin  80 mg Oral Daily   B-complex with vitamin C  1 tablet Oral Daily   And   folic acid  1 mg Oral Daily   Chlorhexidine Gluconate Cloth  6 each Topical Q0600   Chlorhexidine Gluconate Cloth  6 each Topical Q0600   clopidogrel  75 mg Oral Daily   escitalopram  10 mg Oral Daily   famotidine  20 mg Oral Daily   feeding supplement  237 mL  Oral BID BM   heparin injection (subcutaneous)  5,000 Units Subcutaneous Q8H   liver oil-zinc oxide   Topical Q4H   metoprolol tartrate  12.5 mg Oral BID   midodrine  5 mg Oral TID WC   sodium chloride flush  3 mL Intravenous Q12H   [START ON 05/17/2022] vancomycin  125 mg Oral Q12H   vancomycin  125 mg Oral QID

## 2022-05-15 NOTE — Consult Note (Addendum)
Hospital Consult  VASCULAR SURGERY ASSESSMENT & PLAN:   DEHISCENCE OF INCISION RIGHT AV GRAFT: This patient had a right upper arm graft placed in April 2021.  She had presented with a possible infected segment of her graft and on 04/27/2022 underwent excision of the segment which was degenerative and an interposition 6 mm PTFE graft was placed around this area.  After the revision her graft was being used successfully using an old segment of graft.  Today she was noted to have a dehiscence of one of her incisions.  I have recommended exploration in the operating room.  Hopefully we can close the wound over the graft and salvage the graft.  I have explained however that there is a chance that we would have to remove the graft if it is infected or significantly degenerative in which case she would also need placement of a catheter.  All of her questions were answered and she is agreeable to proceed.  Gae Gallop, MD 1:14 PM   Reason for Consult:  ulceration and oozing of right AV graft Requesting Physician:  Despina Pole MRN #:  [id number]  History of Present Illness: This is a 71 y.o. female with ESRD who recently underwent excision of right arm AV graft with revision using interposition PTFE graft by Dr. Scot Dock on 04/27/22. Today during HD she was noted to have blood on her right arm dressings and when the dressings were removed to initiate dialysis she had lage ulceration with oozing present. Her RN states that this was not present prior to today. She dialyzed without issues on Wednesday 5/32 using her right AV graft. Vascular surgery was therefore consulted for evaluation. Patient has no other HD access at this time. She has an old non functioning left AV fistula.   Past Medical History:  Diagnosis Date   Acute ischemic stroke ([place]) 02/23/2017   Acute on chronic diastolic CHF (congestive heart failure) ([place])    Adenomatous polyp of colon 10/2010, 2006, 2015   Anemia in CKD (chronic  kidney disease) 11/07/2012   s/p blood transfusion.    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  Had some early rejection then stable function for years, then had slow decline of function and went back on hemodialysis in 2012.  Gets HD TTS schedule at [facility] on [address] still using L forearm AVF.      GERD (gastroesophageal reflux disease)    GI bleed 2017   felt to be ischemic colitis, last colo 2015   Hyperlipidemia    Hypertension    Neurologic gait dysfunction    Neuromuscular disorder (HCC)    neuropathy hand and legs   NSTEMI (non-ST elevated myocardial infarction) ([place]) 05/14/2020   Osteoporosis    Pneumonia    Pseudoaneurysm of surgical AV fistula (HCC)    left upper arm    Past Surgical History:  Procedure Laterality Date   ABDOMINAL AORTOGRAM W/LOWER EXTREMITY N/A 03/26/2022   Procedure: ABDOMINAL AORTOGRAM W/LOWER EXTREMITY;  Surgeon: Marty Heck, MD;  Location: [place] [facility];  Service: Cardiovascular;  Laterality: N/A;  Left   AV FISTULA PLACEMENT     for  dialysis   AV FISTULA PLACEMENT Left 11/22/2015   Procedure: ARTERIOVENOUS (AV) FISTULA CREATION-LEFT BRACHIOCEPHALIC;  Surgeon: Serafina Mitchell, MD;  Location: [place];  Service: Vascular;  Laterality: Left;   AV FISTULA PLACEMENT Right 03/15/2020   Procedure: INSERTION OF ARTERIOVENOUS (AV) GORE-TEX GRAFT ARM ( BRACHIAL AXILLARY );  Surgeon: Katha Cabal, MD;  Location: [facility];  Service: Vascular;  Laterality: Right;   [place] Right 04/27/2022   Procedure: REVISION OF RIGHT ARTERIOVENOUS GRAFT AND EXCISION OF RIGHT DEGENERATED GRAFT (AVG);  Surgeon: Angelia Mould, MD;  Location: [place];  Service: Vascular;  Laterality: Right;   BACK SURGERY     CERVICAL FUSION     CHOLECYSTECTOMY  12/02/2012   Procedure: LAPAROSCOPIC CHOLECYSTECTOMY WITH INTRAOPERATIVE CHOLANGIOGRAM;  Surgeon: Edward Jolly, MD;  Location: [place];  Service: General;  Laterality: N/A;   EYE SURGERY Bilateral    cataract surgery   EYE SURGERY Left 2019   laser   HEMATOMA EVACUATION Left 12/24/2016   Procedure: EVACUATION HEMATOMA LEFT UPPER ARM;  Surgeon: Waynetta Sandy, MD;  Location: [place];  Service: Vascular;  Laterality: Left;   I & D EXTREMITY Left 12/31/2016   Procedure: IRRIGATION AND DEBRIDEMENT EXTREMITY;  Surgeon: Angelia Mould, MD;  Location: [place];  Service: Vascular;  Laterality: Left;   INSERTION OF DIALYSIS CATHETER Right 12/24/2016   Procedure: INSERTION OF DIALYSIS CATHETER;  Surgeon: Waynetta Sandy, MD;  Location: [place];  Service: Vascular;  Laterality: Right;   INSERTION OF DIALYSIS CATHETER Right 02/04/2017   Procedure: INSERTION OF DIALYSIS CATHETER;  Surgeon: Waynetta Sandy, MD;  Location: [place];  Service: Vascular;  Laterality: Right;   [place] Left 10/23/2016   Procedure: Fistulagram;  Surgeon: Elam Dutch, MD;  Location: [place] [facility];  Service: Cardiovascular;  Laterality: Left;   PERIPHERAL VASCULAR INTERVENTION  03/26/2022   Procedure: PERIPHERAL VASCULAR INTERVENTION;  Surgeon: Marty Heck, MD;  Location: [place] [facility];  Service: Cardiovascular;;  left sfa   PERIPHERAL VASCULAR THROMBECTOMY Right 04/16/2020   Procedure: PERIPHERAL VASCULAR THROMBECTOMY;  Surgeon: Katha Cabal, MD;  Location: [place] [facility];  Service: Cardiovascular;  Laterality: Right;   RESECTION OF ARTERIOVENOUS FISTULA ANEURYSM Left 11/22/2015   Procedure: RESECTION OF LEFT RADIOCEPHALIC FISTULA ANEURYSM ;  Surgeon: Serafina Mitchell, MD;  Location: [place]  OR;  Service: Vascular;  Laterality: Left;   REVISON OF ARTERIOVENOUS FISTULA Left 12/22/2016   Procedure: REVISON OF LEFT ARTERIOVENOUS FISTULA;  Surgeon: Waynetta Sandy, MD;  Location: [place];  Service: Vascular;  Laterality: Left;   REVISON OF ARTERIOVENOUS FISTULA Left 02/04/2017   Procedure: REVISON OF LEFT UPPER ARM ARTERIOVENOUS FISTULA;  Surgeon: Waynetta Sandy, MD;  Location: [place];  Service: Vascular;  Laterality: Left;   UPPER EXTREMITY ANGIOGRAPHY Bilateral 09/19/2019   Procedure: UPPER EXTREMITY ANGIOGRAPHY;  Surgeon: Katha Cabal, MD;  Location: [place] [facility];  Service: Cardiovascular;  Laterality: Bilateral;    Allergies  Allergen Reactions   Sulfa Antibiotics Other (See Comments)    Per patient, both parents allergic-so will not take   Adhesive [Tape] Itching    Prior to Admission medications   Medication Sig Start Date End Date Taking? Authorizing Provider  acetaminophen (TYLENOL) 500 MG tablet Take 1 tablet (500 mg total) by mouth every 6 (six) hours as needed. Patient taking differently: Take 500 mg by mouth every 6 (six) hours  as needed (pain). 09/05/18  Yes Law, [place] M, PA-C  albuterol (PROVENTIL) (2.5 MG/3ML) 0.083% nebulizer solution Take 3 mLs (2.5 mg total) by nebulization every 4 (four) hours as needed for wheezing. Patient taking differently: Take 2.5 mg by nebulization every 6 (six) hours as needed for wheezing. 05/04/22  Yes Elgergawy, Silver Huguenin, MD  albuterol (VENTOLIN HFA) 108 (90 Base) MCG/ACT inhaler Inhale 1-2 puffs into the lungs every 6 (six) hours as needed for wheezing or shortness of breath. Patient taking differently: Inhale 2 puffs into the lungs every 6 (six) hours as needed for shortness of breath. 01/11/22  Yes Mesner, Corene Cornea, MD  atorvastatin (LIPITOR) 80 MG tablet Take 80 mg by mouth at bedtime. 03/25/22  Yes [provider]  B Complex-C-Folic Acid (DIALYVITE 128) 0.8 MG WAFR Take 1 tablet by mouth every  morning. 01/28/22  Yes [provider]  clopidogrel (PLAVIX) 75 MG tablet Take 1 tablet (75 mg total) by mouth daily. Patient taking differently: Take 75 mg by mouth at bedtime. 03/26/22 03/26/23 Yes Marty Heck, MD  escitalopram (LEXAPRO) 10 MG tablet Take 10 mg by mouth every morning. 03/25/22  Yes [provider]  loperamide (IMODIUM) 2 MG capsule Take 2 mg by mouth daily as needed for diarrhea or loose stools.   Yes [provider]  midodrine (PROAMATINE) 5 MG tablet Take 1 tablet (5 mg total) by mouth 3 (three) times daily with meals. 05/04/22  Yes Elgergawy, Silver Huguenin, MD  Nutritional Supplements (NEPRO) LIQD Take 237 mLs by mouth 2 (two) times daily between meals.   Yes [provider]  ondansetron (ZOFRAN) 8 MG tablet Take 8 mg by mouth every 12 (twelve) hours as needed for nausea or vomiting.   Yes [provider]  OVER THE COUNTER MEDICATION Take 1 tablet by mouth every morning. Folic acid - vitamin B complex - vitamin C - selenium - zinc 3 mg   Yes [provider]  pantoprazole (PROTONIX) 40 MG tablet Take 1 tablet (40 mg total) by mouth daily. 08/18/21  Yes Nita Sells, MD  polyethylene glycol (MIRALAX / GLYCOLAX) 17 g packet Take 17 g by mouth daily as needed for mild constipation. 05/04/22  Yes Elgergawy, Silver Huguenin, MD  sevelamer (RENAGEL) 800 MG tablet Take 1,600 mg by mouth 3 (three) times daily with meals. 03/24/22  Yes [provider]  ceFAZolin (ANCEF) 2-4 GM/100ML-% IVPB Inject 100 mLs (2 g total) into the vein every Monday, Wednesday, and Friday at 8 PM for 22 days. Patient will receive this after her HD at HD center. Patient not taking: Reported on 05/06/2022 05/04/22 05/26/22  Elgergawy, Silver Huguenin, MD  cephALEXin (KEFLEX) 500 MG capsule Take 1 capsule (500 mg total) by mouth every evening. Start on 05/26/2022, continue with seen by ID. Patient not taking: Reported on 05/06/2022 05/26/22 06/25/22  Elgergawy, Silver Huguenin,  MD  feeding supplement (ENSURE ENLIVE / ENSURE PLUS) LIQD Take 237 mLs by mouth 2 (two) times daily between meals. Patient not taking: Reported on 05/06/2022 01/24/22   Swayze, Ava, DO  zolpidem (AMBIEN) 10 MG tablet Take 10 mg by mouth at bedtime. Patient not taking: Reported on 05/06/2022 02/10/22   [provider]    Social History   Socioeconomic History   Marital status: Widowed    Spouse name: Not on file   Number of children: 2   Years of education: Not on file   Highest education level: Not on file  Occupational History  Not on file  Tobacco Use   Smoking status: Former    Types: Cigarettes    Quit date: 12/31/1991    Years since quitting: 30.3   Smokeless tobacco: Never  Vaping Use   Vaping Use: Never used  Substance and Sexual Activity   Alcohol use: No    Alcohol/week: 0.0 standard drinks   Drug use: No   Sexual activity: Not Currently  Other Topics Concern   Not on file  Social History Narrative   Living with her mother    Right handed   Caffeine: only decaf clears ("no brown sodas" or coffee d/t kidney disease)   Low potassium foods d/t kidney disease   Social Determinants of Health   Financial Resource Strain: Not on file  Food Insecurity: Not on file  Transportation Needs: Not on file  Physical Activity: Not on file  Stress: Not on file  Social Connections: Not on file  Intimate Partner Violence: Not on file     Family History  Problem Relation Age of Onset   Colon cancer Brother    Cancer Brother    Coronary artery disease Mother 45   Hyperlipidemia Mother    Hypertension Mother    Stroke Maternal Aunt    Esophageal cancer Neg Hx    Stomach cancer Neg Hx    Rectal cancer Neg Hx     ROS: Otherwise negative unless mentioned in HPI  Physical Examination  Vitals:   05/15/22 0759 05/15/22 0952  BP: (!) 105/54 (!) 97/55  Pulse: 75 75  Resp: 17 16  Temp: 97.6 F (36.4 C) (!) 96 F (35.6 C)  SpO2: 98% 98%   Body mass index is  32.19 kg/m.  General:  WDWN in NAD Gait: Not observed HENT: WNL, normocephalic Pulmonary: normal non-labored breathing Cardiac: regular, without  Murmurs Vascular Exam/Pulses: 2+ right radial pulse, Right hand warm and well perfused Extremities: Right AV graft incision dehiscence as below with some clot present. No active bleeding   Musculoskeletal: no muscle wasting or atrophy  Neurologic: A&O X 3;  No focal weakness or paresthesias are detected; speech is fluent/normal Psychiatric:  The pt has Normal affect. Lymph:  Unremarkable  CBC    Component Value Date/Time   WBC 6.1 05/15/2022 0653   RBC 2.89 (L) 05/15/2022 0653   HGB 9.3 (L) 05/15/2022 0653   HGB 10.6 (L) 07/13/2018 0814   HGB 8.7 (L) 09/24/2011 1038   HCT 29.2 (L) 05/15/2022 0653   HCT 33.0 (L) 07/13/2018 0814   HCT 27.3 (L) 09/24/2011 1038   PLT 214 05/15/2022 0653   PLT 203 07/13/2018 0814   MCV 101.0 (H) 05/15/2022 0653   MCV 91 07/13/2018 0814   MCV 94.5 09/24/2011 1038   MCH 32.2 05/15/2022 0653   MCHC 31.8 05/15/2022 0653   RDW 20.3 (H) 05/15/2022 0653   RDW 16.9 (H) 07/13/2018 0814   RDW 20.1 (H) 09/24/2011 1038   LYMPHSABS 1.6 05/15/2022 0653   LYMPHSABS 1.1 09/24/2011 1038   MONOABS 0.9 05/15/2022 0653   MONOABS 0.6 09/24/2011 1038   EOSABS 0.5 05/15/2022 0653   EOSABS 0.1 09/24/2011 1038   BASOSABS 0.1 05/15/2022 0653   BASOSABS 0.0 09/24/2011 1038    BMET    Component Value Date/Time   NA 135 05/15/2022 0653   NA 140 07/13/2018 0814   K 4.5 05/15/2022 0653   CL 97 (L) 05/15/2022 0653   CO2 24 05/15/2022 0653   GLUCOSE 88 05/15/2022 0653   BUN  21 05/15/2022 0653   BUN 45 (H) 07/13/2018 0814   CREATININE 4.75 (H) 05/15/2022 0653   CALCIUM 9.4 05/15/2022 0653   CALCIUM 9.6 08/03/2011 1005   GFRNONAA 9 (L) 05/15/2022 0653   GFRAA 7 (L) 07/26/2020 0920    COAGS: Lab Results  Component Value Date   INR 1.2 05/06/2022   INR 1.2 02/21/2021   INR 1.1 03/14/2020     Non-Invasive  Vascular Imaging:   none  ASSESSMENT/PLAN: This is a 71 y.o. female with ESRD with recently revised right AV graft on 04/27/22 by Dr. Scot Dock now with new ulceration overlying repair incision. Recommendation is for exploration in the OR. She will need to be NPO. Her son is her POA who will need contacted for consent. She has no other HD access so TDC may need placed in OR as well. HD can be rearranged for this evening or tomorrow morning   Karoline Caldwell PA-C [facility] [phone] 05/15/2022  11:58 AM

## 2022-05-15 NOTE — Progress Notes (Signed)
Patient was seen  by Larina Earthly concerning the wound on the graft.  The patient is being transferred back to her room on [facility] to await surgery. Patient has remained stable without complaints during her time on the dialysis unit.

## 2022-05-15 NOTE — Progress Notes (Signed)
Patient presented for treatment on the dialysis unit.  The patient had a dressing with dried blood that was covering a wound in the fistula.  There were stitches around the wound and one stitch attached to the dressing.  Treatment was not initiated Provider assessed at bedside.  Awaiting new orders.

## 2022-05-15 NOTE — Anesthesia Postprocedure Evaluation (Signed)
Anesthesia Post Note  Patient: [name]  Procedure(s) Performed: EXPLORATION  OF  RIGHT ARM ARTERIOVENOUS GORE-TEX GRAFT (Right: Arm Upper)     Patient location during evaluation: PACU Anesthesia Type: General Level of consciousness: sedated and patient cooperative Pain management: pain level controlled Vital Signs Assessment: post-procedure vital signs reviewed and stable Respiratory status: spontaneous breathing Cardiovascular status: stable Anesthetic complications: no   No notable events documented.  Last Vitals:  Vitals:   05/15/22 1528 05/15/22 1622  BP: (!) 94/38 (!) 94/41  Pulse: 79 78  Resp: 15 17  Temp: (!) 36.3 C 36.7 C  SpO2: 96% 92%    Last Pain:  Vitals:   05/15/22 1528  TempSrc:   PainSc: 0-No pain                 Nolon Nations

## 2022-05-15 NOTE — TOC Progression Note (Signed)
Transition of Care [facility]) - Progression Note    Patient Details  Name: [name] MRN: [id number] Date of Birth: 03/01/1951  Transition of Care [facility]) CM/SW [place], [place] Phone Number: 05/15/2022, 5:21 PM  Clinical Narrative:    CSW spoke with pt son to provide update on pt DC and DC plan. CSW will continue to follow.   Expected Discharge Plan: [place] Barriers to Discharge: Continued Medical Work up, Ship broker  Expected Discharge Plan and Services Expected Discharge Plan: [place]       Living arrangements for the past 2 months: [place] Determinants of Health (SDOH) Interventions    Readmission Risk Interventions    11/11/2021    3:49 PM 08/18/2021    3:01 PM  Readmission Risk Prevention Plan  Transportation Screening Complete Complete  Medication Review (RN Care Manager) Complete Complete  PCP or Specialist appointment within 3-5 days of discharge Complete Complete  HRI or Home Care Consult Complete Complete  SW Recovery Care/Counseling Consult Complete Complete  Palliative Care Screening Not Applicable Not Applicable  Skilled Nursing Facility Complete Complete

## 2022-05-15 NOTE — Anesthesia Procedure Notes (Signed)
Procedure Name: Intubation Date/Time: 05/15/2022 2:15 PM Performed by: Lance Coon, CRNA Pre-anesthesia Checklist: Patient identified, Emergency Drugs available, Suction available, Patient being monitored and Timeout performed Patient Re-evaluated:Patient Re-evaluated prior to induction Oxygen Delivery Method: Circle system utilized Preoxygenation: Pre-oxygenation with 100% oxygen Induction Type: IV induction, Rapid sequence and Cricoid Pressure applied Laryngoscope Size: Miller and 3 Grade View: Grade I Tube type: Oral Tube size: 7.0 mm Number of attempts: 1 Airway Equipment and Method: Stylet Placement Confirmation: ETT inserted through vocal cords under direct vision, positive ETCO2 and breath sounds checked- equal and bilateral Secured at: 20 cm Tube secured with: Tape Dental Injury: Teeth and Oropharynx as per pre-operative assessment

## 2022-05-15 NOTE — Progress Notes (Signed)
PROGRESS NOTE    [name]  NWG:[id number] DOB: 07/16/1951 DOA: 05/06/2022 PCP: Kathryn Dills, MD  Chief Complaint  Patient presents with   Shortness of Breath    Brief Narrative:  [name] is [name] 71 yo female with PMH dCHF, ESRD on HD, CVA, CAD, HLD, HTN, GERD who presented with shortness of breath.  She was recently hospitalized from 04/26/2022 until 05/04/2022 with septic shock due to staph bacteremia from infected AV graft.  She was discharged on Ancef to complete course followed by chronic oral suppression. On admission she was found to be somnolent, hypoxic, and volume overloaded. Further work-up revealed elevated troponin which further uptrended consistent with NSTEMI.  She was evaluated by cardiology and also started on [name] heparin drip on admission.  She was not considered [name] good interventional candidate.    Assessment & Plan:   Principal Problem:   NSTEMI (non-ST elevated myocardial infarction) (HCC) Active Problems:   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HCC)   C. difficile diarrhea   End-stage renal disease on hemodialysis (HCC)   Dermatitis   Acute on chronic diastolic (congestive) heart failure (HCC)   Physical deconditioning   Assessment and Plan: * NSTEMI (non-ST elevated myocardial infarction) (HCC) - SOB and CP on admission; trop elevated with peak at 1,008 - s/p 48 hr of heparin drip completed - overnight 5/31-6/1 she c/o SOB/chest discomfort, like her chest was exploding (sx lasted about 30 min to 1 hour, now resolved).  EKG without acute ST-T wave changes, troponins elevated/rising again.  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  She's not candidate for surgical revascularization.  Currently attempting medical therapy.  Hypotension limits use of antianginal meds.  She likely has additional myocardium ischemic and at risk and will continue to have episodes of angina pectoris with "piecemeal"  NSTEMI '"for weeks to come".  Recommending treatment with plavix, highest tolerated beta blocker by BP, high dose statin.  Can use sublingual nitro prn, no long acting nitrates due to BP.  Recommended consideration of palliative care c/s. - continue statin and BB - per cardiology, not currently an interventional candidate due to multiple medical issues currently  -Plavix monotherapy okay per cardiology  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HCC) - hospitalized 5/14 - 5/22; AV graft infection, MSSA bacteremia; evaluated by ID. Plan for Ancef post HD MWF until 6/13 then oral doxy 100 mg BID (due to CDI while on Ancef) - continue Ancef  - appreciate ID evaluation after re-admission  - Follow-up repeat blood cultures obtained on 05/06/2022: NGTD x 5 days  C. difficile diarrhea - s/p recent diarrhea; C diff Ag positive, toxin negative, PCR positive - started on oral Vanc - per ID (5/25) continue oral vancomycin 125 mg QID x 10 days then 125 mg BID for 7 days after completion of IV abx - diarrhea improving, follow  End-stage renal disease on hemodialysis (HCC) - Continue chronic midodrine.  BP stable - Nephrology following, continue inpatient dialysis per nephrology - dehiscenceof incision R AV graft noted 6/2 -> to OR today with vascular  Dermatitis - skin breakdown from ongoing diarrhea - WOC consult appreciated - did not respond much to [name], now on desitin trial   Acute on chronic diastolic (congestive) heart failure (HCC) - Volume removal planned with HD - CXR on admission consistent with volume overload  Physical deconditioning - Planning for discharge back to [name] at discharge.  Will need insurance auth  DVT prophylaxis: heparin Code Status: DNR Family Communication: none at bedsdie Disposition:   Status is: Inpatient Remains inpatient appropriate because: continued diarrhea   Consultants:  ID Nephrology cardiology  Procedures:  none  Antimicrobials:   Anti-infectives (From admission, onward)    Start     Dose/Rate Route Frequency Ordered Stop   05/17/22 1000  [MAR Hold]  vancomycin (VANCOCIN) 50 mg/mL oral solution SOLN 125 mg        (MAR Hold since Fri 05/15/2022 at 1318.Hold Reason: Transfer to [facility] area)   125 mg Oral Every 12 hours 05/07/22 1314 06/03/22 0959   05/15/22 1345  ceFAZolin (ANCEF) IVPB 1 g/50 mL premix        1 g 100 mL/hr over 30 Minutes Intravenous  Once 05/15/22 1343 05/15/22 1405   05/15/22 1338  ceFAZolin (ANCEF) 1-4 GM/50ML-% IVPB       Note to Pharmacy: [name] E: cabinet override      05/15/22 1338 05/15/22 1423   05/08/22 1800  [MAR Hold]  ceFAZolin (ANCEF) IVPB 2g/100 mL premix        (MAR Hold since Fri 05/15/2022 at 1318.Hold Reason: Transfer to [facility] area)   2 g 200 mL/hr over 30 Minutes Intravenous Every M-W-F (1800) 05/07/22 1016 05/26/22 2359   05/08/22 1200  ceFAZolin (ANCEF) IVPB 1 g/50 mL premix  Status:  Discontinued        1 g 100 mL/hr over 30 Minutes Intravenous Every M-W-F (Hemodialysis) 05/07/22 0935 05/07/22 1016   05/07/22 1030  [MAR Hold]  vancomycin (VANCOCIN) capsule 125 mg        (MAR Hold since Fri 05/15/2022 at 1318.Hold Reason: Transfer to [facility] area)   125 mg Oral 4 times daily 05/07/22 0935 05/17/22 0959   05/06/22 1800  vancomycin (VANCOREADY) IVPB 500 mg/100 mL  Status:  Discontinued        500 mg 100 mL/hr over 60 Minutes Intravenous Every M-W-F (Hemodialysis) 05/06/22 1218 05/07/22 0935   05/06/22 1800  ceFEPIme (MAXIPIME) 2 g in sodium chloride 0.9 % 100 mL IVPB  Status:  Discontinued        2 g 200 mL/hr over 30 Minutes Intravenous Every M-W-F (Hemodialysis) 05/06/22 1218 05/07/22 0935   05/06/22 1145  ceFEPIme (MAXIPIME) 2 g in sodium chloride 0.9 % 100 mL IVPB        2 g 200 mL/hr over 30 Minutes Intravenous  Once 05/06/22 1143 05/06/22 1324   05/06/22 1145  vancomycin (VANCOCIN) IVPB 1000 mg/200 mL premix        1,000 mg 200 mL/hr over 60 Minutes  Intravenous  Once 05/06/22 1143 05/06/22 1436       Subjective: No CP, no SOB Seen in dialysis  Objective: Vitals:   05/15/22 0603 05/15/22 0759 05/15/22 0952 05/15/22 1323  BP: (!) 97/50 (!) 105/54 (!) 97/55 (!) 114/41  Pulse: 82 75 75 70  Resp: 16 17 16 16   Temp: 98 F (36.7 C) 97.6 F (36.4 C) (!) 96 F (35.6 C) 97.7 F (36.5 C)  TempSrc: Oral Oral  Oral  SpO2: 100% 98% 98% 100%  Weight:    79.8 kg  Height:    5\' 2"  (1.575 m)    Intake/Output Summary (Last 24 hours) at 05/15/2022 1511 Last data filed at 05/14/2022 1951 Gross per 24 hour  Intake 340 ml  Output 1 ml  Net 339 ml   Filed Weights   05/12/22 0500 05/14/22 0551 05/15/22 1323  Weight: 79.4  kg 79.8 kg 79.8 kg    Examination:  General: No acute distress. Cardiovascular: RRR Lungs: unlabored Abdomen: Soft, nontender, nondistended Neurological: Alert and oriented 3. Moves all extremities 4 . Cranial nerves II through XII grossly intact.  Data Reviewed: I have personally reviewed following labs and imaging studies  CBC: Recent Labs  Lab 05/11/22 0032 05/12/22 0022 05/13/22 0322 05/14/22 0559 05/15/22 0653  WBC 10.2 9.7 7.9 7.6 6.1  NEUTROABS 5.2 5.7 4.1 4.6 3.0  HGB 10.1* 9.7* 11.1* 10.2* 9.3*  HCT 32.2* 29.1* 34.5* 30.5* 29.2*  MCV 102.9* 99.7 101.2* 100.3* 101.0*  PLT 251 248 227 218 214    Basic Metabolic Panel: Recent Labs  Lab 05/08/22 1700 05/09/22 0046 05/11/22 0032 05/12/22 0022 05/13/22 0322 05/14/22 0559 05/15/22 0653  NA 132*   < > 135 135 135 134* 135  K 4.1   < > 4.6 3.7 4.5 4.1 4.5  CL 95*   < > 102 98 99 95* 97*  CO2 20*   < > 21* 25 23 28 24   GLUCOSE 75   < > 117* 93 89 88 88  BUN 28*   < > 24* 11 23 10 21   CREATININE 4.54*   < > 5.45* 3.58* 5.05* 3.09* 4.75*  CALCIUM 10.1   < > 10.1 9.2 9.6 9.6 9.4  MG  --    < > 2.5* 2.1 2.3 2.1 2.1  PHOS 3.7  --   --  2.2* 2.6 1.9* 2.8   < > = values in this interval not displayed.    GFR: Estimated Creatinine Clearance:  10.6 mL/min (Davidmichael Zarazua) (by C-G formula based on SCr of 4.75 mg/dL (H)).  Liver Function Tests: Recent Labs  Lab 05/08/22 1700 05/12/22 0022 05/13/22 0322 05/14/22 0559 05/15/22 0653  ALBUMIN 2.8* 2.7* 3.0* 3.0* 3.0*    CBG: No results for input(s): GLUCAP in the last 168 hours.   Recent Results (from the past 240 hour(s))  Resp Panel by RT-PCR (Flu Elida Harbin&B, Covid) Nasopharyngeal Swab     Status: None   Collection Time: 05/06/22  7:05 AM   Specimen: Nasopharyngeal Swab; Nasopharyngeal(NP) swabs in vial transport medium  Result Value Ref Range Status   SARS Coronavirus 2 by RT PCR NEGATIVE NEGATIVE Final    Comment: (NOTE) SARS-CoV-2 target nucleic acids are NOT DETECTED.  The SARS-CoV-2 RNA is generally detectable in upper respiratory specimens during the acute phase of infection. The lowest concentration of SARS-CoV-2 viral copies this assay can detect is 138 copies/mL. Kele Barthelemy negative result does not preclude SARS-Cov-2 infection and should not be used as the sole basis for treatment or other patient management decisions. Tran Randle negative result may occur with  improper specimen collection/handling, submission of specimen other than nasopharyngeal swab, presence of viral mutation(s) within the areas targeted by this assay, and inadequate number of viral copies(<138 copies/mL). Ashling Roane negative result must be combined with clinical observations, patient history, and epidemiological information. The expected result is Negative.  Fact Sheet for Patients:  BloggerCourse.com  Fact Sheet for Healthcare Providers:  SeriousBroker.it  This test is no t yet approved or cleared by the [place] FDA and  has been authorized for detection and/or diagnosis of SARS-CoV-2 by FDA under an Emergency Use Authorization (EUA). This EUA will remain  in effect (meaning this test can be used) for the duration of the COVID-19 declaration under Section 564(b)(1) of  the Act, 21 U.S.C.section 360bbb-3(b)(1), unless the authorization is terminated  or revoked sooner.  Influenza Lonnetta Kniskern by PCR NEGATIVE NEGATIVE Final   Influenza B by PCR NEGATIVE NEGATIVE Final    Comment: (NOTE) The Xpert Xpress SARS-CoV-2/FLU/RSV plus assay is intended as an aid in the diagnosis of influenza from Nasopharyngeal swab specimens and should not be used as Forestine Macho sole basis for treatment. Nasal washings and aspirates are unacceptable for Xpert Xpress SARS-CoV-2/FLU/RSV testing.  Fact Sheet for Patients: BloggerCourse.com  Fact Sheet for Healthcare Providers: SeriousBroker.it  This test is not yet approved or cleared by the [place] FDA and has been authorized for detection and/or diagnosis of SARS-CoV-2 by FDA under an Emergency Use Authorization (EUA). This EUA will remain in effect (meaning this test can be used) for the duration of the COVID-19 declaration under Section 564(b)(1) of the Act, 21 U.S.C. section 360bbb-3(b)(1), unless the authorization is terminated or revoked.  Performed at [facility], [address] [place], [place] [address]   Blood Culture (routine x 2)     Status: None   Collection Time: 05/06/22 10:00 AM   Specimen: BLOOD  Result Value Ref Range Status   Specimen Description BLOOD SITE NOT SPECIFIED  Final   Special Requests   Final    BOTTLES DRAWN AEROBIC AND ANAEROBIC Blood Culture results may not be optimal due to an inadequate volume of blood received in culture bottles   Culture   Final    NO GROWTH 5 DAYS Performed at [facility], [address], [place], [place] [address]    Report Status 05/11/2022 FINAL  Final  Blood Culture (routine x 2)     Status: None   Collection Time: 05/06/22 10:05 AM   Specimen: BLOOD  Result Value Ref Range Status   Specimen Description BLOOD SITE NOT SPECIFIED  Final   Special Requests   Final    BOTTLES DRAWN AEROBIC AND  ANAEROBIC Blood Culture adequate volume   Culture   Final    NO GROWTH 5 DAYS Performed at [facility], [address], [place], [place] [address]    Report Status 05/11/2022 FINAL  Final  C Difficile Quick Screen w PCR reflex     Status: Abnormal   Collection Time: 05/07/22 12:58 AM   Specimen: STOOL  Result Value Ref Range Status   C Diff antigen POSITIVE (Brace Welte) NEGATIVE Final   C Diff toxin NEGATIVE NEGATIVE Final   C Diff interpretation Results are indeterminate. See PCR results.  Final    Comment: Performed at [facility], [address] [place], [place] [address]  C. Diff by PCR, Reflexed     Status: Abnormal   Collection Time: 05/07/22 12:58 AM  Result Value Ref Range Status   Toxigenic C. Difficile by PCR POSITIVE (Statia Burdick) NEGATIVE Final    Comment: Positive for toxigenic C. difficile with little to no toxin production. Only treat if clinical presentation suggests symptomatic illness. Performed at [facility], [address] [place], [place] [address]          Radiology Studies: DG CHEST PORT 1 VIEW  Result Date: 05/14/2022 CLINICAL DATA:  Shortness of breath Chest pain EXAM: PORTABLE CHEST - 1 VIEW COMPARISON:  05/06/2022 FINDINGS: Unchanged cardiomegaly. Interval improvement of pulmonary vascular congestion. Diffusely calcified thoracic aorta. The lungs are clear. IMPRESSION: 1. Unchanged cardiomegaly. 2. Interval improvement of pulmonary vascular congestion. Electronically Signed   By: Acquanetta Belling M.D.   On: 05/14/2022 11:13        Scheduled Meds:  [ZHY  Hold] atorvastatin  80 mg Oral Daily   [MAR Hold] B-complex with vitamin C  1 tablet Oral Daily   And   [MAR Hold] folic acid  1 mg Oral Daily   [MAR Hold] Chlorhexidine Gluconate Cloth  6 each Topical Q0600   [MAR Hold] Chlorhexidine Gluconate Cloth  6 each Topical Q0600   [MAR Hold] clopidogrel  75 mg Oral Daily   [MAR Hold] escitalopram  10 mg Oral Daily   [MAR Hold] famotidine  20 mg Oral  Daily   [MAR Hold] feeding supplement  237 mL Oral BID BM   [MAR Hold] heparin injection (subcutaneous)  5,000 Units Subcutaneous Q8H   [MAR Hold] liver oil-zinc oxide   Topical Q4H   [MAR Hold] metoprolol tartrate  12.5 mg Oral BID   [MAR Hold] midodrine  5 mg Oral TID WC   [MAR Hold] sodium chloride flush  3 mL Intravenous Q12H   [MAR Hold] vancomycin  125 mg Oral Q12H   [MAR Hold] vancomycin  125 mg Oral QID   Continuous Infusions:  sodium chloride 10 mL/hr at 05/15/22 1341   [MAR Hold]  ceFAZolin (ANCEF) IV 2 g (05/14/22 0008)     LOS: 9 days    Time spent: over 30 min    Lacretia Nicks, MD [facility]   To contact the attending provider between 7A-7P or the covering provider during after hours 7P-7A, please log into the web site [email or web address] and access using universal [place] password for that web site. If you do not have the password, please call the hospital operator.  05/15/2022, 3:11 PM

## 2022-05-15 NOTE — Progress Notes (Signed)
Pt didn't get HD today due to access issue. We will change cefazolin to 1g IV q24 with the same stop date until access is established.  Onnie Boer, PharmD, BCIDP, AAHIVP, CPP Infectious Disease Pharmacist 05/15/2022 4:41 PM

## 2022-05-16 ENCOUNTER — Encounter (HOSPITAL_COMMUNITY): Payer: Self-pay | Admitting: Vascular Surgery

## 2022-05-16 DIAGNOSIS — I214 Non-ST elevation (NSTEMI) myocardial infarction: Secondary | ICD-10-CM | POA: Diagnosis not present

## 2022-05-16 LAB — RENAL FUNCTION PANEL
Albumin: 3 g/dL — ABNORMAL LOW (ref 3.5–5.0)
Anion gap: 16 — ABNORMAL HIGH (ref 5–15)
BUN: 30 mg/dL — ABNORMAL HIGH (ref 8–23)
CO2: 21 mmol/L — ABNORMAL LOW (ref 22–32)
Calcium: 9.6 mg/dL (ref 8.9–10.3)
Chloride: 96 mmol/L — ABNORMAL LOW (ref 98–111)
Creatinine, Ser: 5.63 mg/dL — ABNORMAL HIGH (ref 0.44–1.00)
GFR, Estimated: 8 mL/min — ABNORMAL LOW (ref >60)
Glucose, Bld: 144 mg/dL — ABNORMAL HIGH (ref 70–99)
Phosphorus: 4.9 mg/dL — ABNORMAL HIGH (ref 2.5–4.6)
Potassium: 5.5 mmol/L — ABNORMAL HIGH (ref 3.5–5.1)
Sodium: 133 mmol/L — ABNORMAL LOW (ref 135–145)

## 2022-05-16 LAB — CBC WITH DIFFERENTIAL/PLATELET
Abs Immature Granulocytes: 0.04 10*3/uL (ref 0.00–0.07)
Basophils Absolute: 0 10*3/uL (ref 0.0–0.1)
Basophils Relative: 0 %
Eosinophils Absolute: 0 10*3/uL (ref 0.0–0.5)
Eosinophils Relative: 0 %
HCT: 29.9 % — ABNORMAL LOW (ref 36.0–46.0)
Hemoglobin: 9.5 g/dL — ABNORMAL LOW (ref 12.0–15.0)
Immature Granulocytes: 0 %
Lymphocytes Relative: 4 %
Lymphs Abs: 0.6 10*3/uL — ABNORMAL LOW (ref 0.7–4.0)
MCH: 32.9 pg (ref 26.0–34.0)
MCHC: 31.8 g/dL (ref 30.0–36.0)
MCV: 103.5 fL — ABNORMAL HIGH (ref 80.0–100.0)
Monocytes Absolute: 0.6 10*3/uL (ref 0.1–1.0)
Monocytes Relative: 5 %
Neutro Abs: 11.4 10*3/uL — ABNORMAL HIGH (ref 1.7–7.7)
Neutrophils Relative %: 91 %
Platelets: 226 10*3/uL (ref 150–400)
RBC: 2.89 MIL/uL — ABNORMAL LOW (ref 3.87–5.11)
RDW: 20.1 % — ABNORMAL HIGH (ref 11.5–15.5)
WBC: 12.6 10*3/uL — ABNORMAL HIGH (ref 4.0–10.5)
nRBC: 0.2 % (ref 0.0–0.2)

## 2022-05-16 LAB — MAGNESIUM: Magnesium: 2.2 mg/dL (ref 1.7–2.4)

## 2022-05-16 MED ORDER — SODIUM CHLORIDE 0.9 % IV BOLUS
250.0000 mL | Freq: Once | INTRAVENOUS | Status: AC
  Administered 2022-05-16: 250 mL via INTRAVENOUS

## 2022-05-16 MED ORDER — MIDODRINE HCL 5 MG PO TABS
10.0000 mg | ORAL_TABLET | Freq: Three times a day (TID) | ORAL | Status: DC
  Administered 2022-05-16 – 2022-05-22 (×18): 10 mg via ORAL
  Filled 2022-05-16 (×17): qty 2

## 2022-05-16 MED ORDER — DARBEPOETIN ALFA 200 MCG/0.4ML IJ SOSY
PREFILLED_SYRINGE | INTRAMUSCULAR | Status: AC
  Filled 2022-05-16: qty 0.4

## 2022-05-16 NOTE — Assessment & Plan Note (Addendum)
Remains hypotensive but improved. -Continue midodrine

## 2022-05-16 NOTE — Progress Notes (Addendum)
PROGRESS NOTE    [name]  VQQ:[id number] DOB: [birth year] DOA: 05/06/2022 PCP: Renford Dills, MD  Chief Complaint  Patient presents with   Shortness of Breath    Brief Narrative:  [name] is [name] 71 yo female with PMH dCHF, ESRD on HD, CVA, CAD, HLD, HTN, GERD who presented with shortness of breath.  She was recently hospitalized from 04/26/2022 until 05/04/2022 with septic shock due to staph bacteremia from infected AV graft.  She was discharged on Ancef to complete course followed by chronic oral suppression. On admission she was found to be somnolent, hypoxic, and volume overloaded. Further work-up revealed elevated troponin which further uptrended consistent with NSTEMI.  She was evaluated by cardiology and also started on [place] heparin drip on admission.  She was not considered Alois Mincer good interventional candidate.    Assessment & Plan:   Principal Problem:   NSTEMI (non-ST elevated myocardial infarction) (HCC) Active Problems:   Hypotension   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HCC)   C. difficile diarrhea   End-stage renal disease on hemodialysis (HCC)   Dermatitis   Acute on chronic diastolic (congestive) heart failure (HCC)   Physical deconditioning   Assessment and Plan: * NSTEMI (non-ST elevated myocardial infarction) (HCC) - SOB and CP on admission; trop elevated with peak at 1,008 - s/p 48 hr of heparin drip completed - overnight 5/31-6/1 she c/o SOB/chest discomfort, like her chest was exploding (sx lasted about 30 min to 1 hour, now resolved).  EKG without acute ST-T wave changes, troponins elevated/rising again.  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  She's not candidate for surgical revascularization.  Currently attempting medical therapy.  Hypotension limits use of antianginal meds.  She likely has additional myocardium ischemic and at risk and will continue to have episodes of angina pectoris  with "piecemeal" NSTEMI '"for weeks to come".  Recommending treatment with plavix, highest tolerated beta blocker by BP, high dose statin.  Can use sublingual nitro prn, no long acting nitrates due to BP.  Recommended consideration of palliative care c/s. - continue statin and BB (hold BB for BP) - per cardiology, not currently an interventional candidate due to multiple medical issues currently  -Plavix monotherapy okay per cardiology  Hypotension Noted overnight (as low as 54/43 around 2333 on 6/2 and 65/55 during dialysis) and now during dialysis Getting BP from leg cuff, unclear how accurate - she's asymptomatic Afebrile, white count is up mildly today, possibly reactive after surgery Stop metoprolol (looks like this was given today for the first time in [name] few days - was being held previously) Bolus and follow IVF   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HCC) - hospitalized 5/14 - 5/22; AV graft infection, MSSA bacteremia; evaluated by ID. Plan for Ancef post HD MWF until 6/13 then oral doxy 100 mg BID (due to CDI while on Ancef) - continue Ancef  - appreciate ID evaluation after re-admission  - Follow-up repeat blood cultures obtained on 05/06/2022: NGTD x 5 days  C. difficile diarrhea - s/p recent diarrhea; C diff Ag positive, toxin negative, PCR positive - started on oral Vanc - per ID (5/25) continue oral vancomycin 125 mg QID x 10 days then 125 mg BID for 7 days after completion of IV abx - diarrhea improving, follow  End-stage renal disease on hemodialysis (HCC) - Continue chronic midodrine.  BP stable - Nephrology following, continue inpatient dialysis per nephrology - dehiscence of incision R AV graft noted  6/2  - s/p exploration of left arm graft and evacuation of hematoma 6/2  Dermatitis - skin breakdown from ongoing diarrhea - WOC consult appreciated - did not respond much to Gerhardt's cream, now on desitin trial   Acute on chronic diastolic (congestive) heart failure (HCC) -  Volume removal planned with HD - CXR on admission consistent with volume overload  Physical deconditioning - Planning for discharge back to [facility] at discharge.  Will need insurance auth      DVT prophylaxis: heparin Code Status: DNR Family Communication: none at bedsdie Disposition:   Status is: Inpatient Remains inpatient appropriate because: continued diarrhea   Consultants:  ID Nephrology cardiology  Procedures:  none  Antimicrobials:  Anti-infectives (From admission, onward)    Start     Dose/Rate Route Frequency Ordered Stop   05/17/22 1000  vancomycin (VANCOCIN) 50 mg/mL oral solution SOLN 125 mg  Status:  Discontinued        125 mg Oral Every 12 hours 05/07/22 1314 05/15/22 1913   05/17/22 1000  vancomycin (VANCOCIN) capsule 125 mg        125 mg Oral Every 12 hours 05/15/22 1913 06/03/22 0959   05/15/22 1800  ceFAZolin (ANCEF) IVPB 1 g/50 mL premix        1 g 100 mL/hr over 30 Minutes Intravenous Every 24 hours 05/15/22 1639 05/27/22 1759   05/15/22 1345  ceFAZolin (ANCEF) IVPB 1 g/50 mL premix        1 g 100 mL/hr over 30 Minutes Intravenous  Once 05/15/22 1343 05/15/22 1405   05/15/22 1338  ceFAZolin (ANCEF) 1-4 GM/50ML-% IVPB       Note to Pharmacy: [name] E: cabinet override      05/15/22 1338 05/15/22 1423   05/08/22 1800  ceFAZolin (ANCEF) IVPB 2g/100 mL premix  Status:  Discontinued        2 g 200 mL/hr over 30 Minutes Intravenous Every M-W-F (1800) 05/07/22 1016 05/15/22 1639   05/08/22 1200  ceFAZolin (ANCEF) IVPB 1 g/50 mL premix  Status:  Discontinued        1 g 100 mL/hr over 30 Minutes Intravenous Every M-W-F (Hemodialysis) 05/07/22 0935 05/07/22 1016   05/07/22 1030  vancomycin (VANCOCIN) capsule 125 mg        125 mg Oral 4 times daily 05/07/22 0935 05/17/22 0959   05/06/22 1800  vancomycin (VANCOREADY) IVPB 500 mg/100 mL  Status:  Discontinued        500 mg 100 mL/hr over 60 Minutes Intravenous Every M-W-F (Hemodialysis) 05/06/22  1218 05/07/22 0935   05/06/22 1800  ceFEPIme (MAXIPIME) 2 g in sodium chloride 0.9 % 100 mL IVPB  Status:  Discontinued        2 g 200 mL/hr over 30 Minutes Intravenous Every M-W-F (Hemodialysis) 05/06/22 1218 05/07/22 0935   05/06/22 1145  ceFEPIme (MAXIPIME) 2 g in sodium chloride 0.9 % 100 mL IVPB        2 g 200 mL/hr over 30 Minutes Intravenous  Once 05/06/22 1143 05/06/22 1324   05/06/22 1145  vancomycin (VANCOCIN) IVPB 1000 mg/200 mL premix        1,000 mg 200 mL/hr over 60 Minutes Intravenous  Once 05/06/22 1143 05/06/22 1436       Subjective: No complaints  Objective: Vitals:   05/16/22 1450 05/16/22 1520 05/16/22 1550 05/16/22 1623  BP: (!) 85/33 (!) 94/41 (!) 95/35 (!) 65/55  Pulse:    71  Resp: 11 12 13  15  Temp:      TempSrc:      SpO2:    98%  Weight:      Height:        Intake/Output Summary (Last 24 hours) at 05/16/2022 1702 Last data filed at 05/16/2022 0640 Gross per 24 hour  Intake 967.5 ml  Output --  Net 967.5 ml   Filed Weights   05/14/22 0551 05/15/22 1323  Weight: 79.8 kg 79.8 kg    Examination:  General: No acute distress. Cardiovascular: RRR Lungs: unlabored Abdomen: Soft, nontender, nondistended Neurological: Alert and oriented 3. Moves all extremities 4 with equal strength. Cranial nerves II through XII grossly intact. Extremities: dressing to rue  Data Reviewed: I have personally reviewed following labs and imaging studies  CBC: Recent Labs  Lab 05/12/22 0022 05/13/22 0322 05/14/22 0559 05/15/22 0653 05/16/22 0121  WBC 9.7 7.9 7.6 6.1 12.6*  NEUTROABS 5.7 4.1 4.6 3.0 11.4*  HGB 9.7* 11.1* 10.2* 9.3* 9.5*  HCT 29.1* 34.5* 30.5* 29.2* 29.9*  MCV 99.7 101.2* 100.3* 101.0* 103.5*  PLT 248 227 218 214 226    Basic Metabolic Panel: Recent Labs  Lab 05/12/22 0022 05/13/22 0322 05/14/22 0559 05/15/22 0653 05/16/22 0121  NA 135 135 134* 135 133*  K 3.7 4.5 4.1 4.5 5.5*  CL 98 99 95* 97* 96*  CO2 25 23 28 24  21*  GLUCOSE  93 89 88 88 144*  BUN 11 23 10 21  30*  CREATININE 3.58* 5.05* 3.09* 4.75* 5.63*  CALCIUM 9.2 9.6 9.6 9.4 9.6  MG 2.1 2.3 2.1 2.1 2.2  PHOS 2.2* 2.6 1.9* 2.8 4.9*    GFR: Estimated Creatinine Clearance: 9 mL/min ([name]) (by C-G formula based on SCr of 5.63 mg/dL (H)).  Liver Function Tests: Recent Labs  Lab 05/12/22 0022 05/13/22 0322 05/14/22 0559 05/15/22 0653 05/16/22 0121  ALBUMIN 2.7* 3.0* 3.0* 3.0* 3.0*    CBG: No results for input(s): GLUCAP in the last 168 hours.   Recent Results (from the past 240 hour(s))  C Difficile Quick Screen w PCR reflex     Status: Abnormal   Collection Time: 05/07/22 12:58 AM   Specimen: STOOL  Result Value Ref Range Status   C Diff antigen POSITIVE ([name]) NEGATIVE Final   C Diff toxin NEGATIVE NEGATIVE Final   C Diff interpretation Results are indeterminate. See PCR results.  Final    Comment: Performed at [facility], [address], [place], [place] [address]  C. Diff by PCR, Reflexed     Status: Abnormal   Collection Time: 05/07/22 12:58 AM  Result Value Ref Range Status   Toxigenic C. Difficile by PCR POSITIVE ([name]) NEGATIVE Final    Comment: Positive for toxigenic C. difficile with little to no toxin production. Only treat if clinical presentation suggests symptomatic illness. Performed at [facility], [address], [place], [place] [address]          Radiology Studies: No results found.      Scheduled Meds:  atorvastatin  80 mg Oral Daily   B-complex with vitamin C  1 tablet Oral Daily   And   folic acid  1 mg Oral Daily   Chlorhexidine Gluconate Cloth  6 each Topical Q0600   Chlorhexidine Gluconate Cloth  6 each Topical Q0600   clopidogrel  75 mg Oral Daily   escitalopram  10 mg Oral Daily   famotidine  20 mg Oral Daily   feeding supplement  237 mL Oral BID  BM   heparin injection (subcutaneous)  5,000 Units Subcutaneous Q8H   liver oil-zinc oxide   Topical Q4H   midodrine  10 mg Oral TID WC   sodium  chloride flush  3 mL Intravenous Q12H   vancomycin  125 mg Oral QID   [START ON 05/17/2022] vancomycin  125 mg Oral Q12H   Continuous Infusions:   ceFAZolin (ANCEF) IV Stopped (05/15/22 1824)   sodium chloride       LOS: 10 days    Time spent: over 30 min    Lacretia Nicks, MD [facility]   To contact the attending provider between 7A-7P or the covering provider during after hours 7P-7A, please log into the web site [email or web address] and access using universal [place] password for that web site. If you do not have the password, please call the hospital operator.  05/16/2022, 5:02 PM

## 2022-05-16 NOTE — Progress Notes (Signed)
Subjective:  Went to OR yesterday for repair of dehisced graft wound/hematoma. Seen in room, alert. No new complaints. HD today.   Objective Vital signs in last 24 hours: Vitals:   05/15/22 2333 05/16/22 0145 05/16/22 0526 05/16/22 0904  BP: (!) 54/34 (!) 74/44 (!) 90/30 (!) 99/46  Pulse: 66 77 73 79  Resp: '18 17 17 18  '$ Temp: 98 F (36.7 C) 98.3 F (36.8 C) 97.7 F (36.5 C) 97.8 F (36.6 C)  TempSrc: Oral Oral Oral Oral  SpO2: 93% 95% 98% 90%  Weight:      Height:        Physical Exam: General: Elderly woman, chronically ill appearing  Heart: RRR no MRG Lungs: CTA, unlabored breathing Abdomen: soft non tender.  Extremities: No sig LE edema  Dialysis Access: RUE AVG with clean incisions to upper portion;    OP Dialysis Orders: MWF [facility]  4h  350/600  45kg   2/2 bath  P2 R AVG  --Hep none - mircera 50 mcg IV q 4 weeks - hectorol 2 ug IV tiw - sensipar 30 tiw po   Assessment/Plan:   ESRD - HD MWF. Missed HD yesterday d/t #2. Plan for HD today  AVG S/p revision of R AV graft 5/15. Wound dehiscence with hematoma to lower portion of graft on 6/2. --repaired in OR by Dr. Scot Dock.  Acute hypoxic respiratory failure: Improved with HD. Appears euvolemic. On RA.  NSTEMI - cardiology following --not currently interventional candidate due to comorbidities.  Recent MSSA sepsis/ AVG infection -plan continue IV cefazolin until 6/13 with plans for oral Doxy 100 mg twice daily (due to CDI while on Ancef )thereafter for suppression.  BP/ volume - BP/volume ok. Occasional hypotension on HD.  Anemia - Hgb 9.3. Resume ESA with next HD.  BMD ckd - Corrected Ca elevated. Holding VDRA/Sensipar here. Phos low. No binder for now   C Diff + - on PO Vanc - per primary  Nutrition. Renal diet w/fluid restrictions.  continue supplements Dementia.   Lynnda Child PA-C [facility] 05/16/2022,10:18 AM   Labs: Basic Metabolic Panel: Recent Labs  Lab 05/14/22 0559  05/15/22 0653 05/16/22 0121  NA 134* 135 133*  K 4.1 4.5 5.5*  CL 95* 97* 96*  CO2 28 24 21*  GLUCOSE 88 88 144*  BUN 10 21 30*  CREATININE 3.09* 4.75* 5.63*  CALCIUM 9.6 9.4 9.6  PHOS 1.9* 2.8 4.9*    Liver Function Tests: Recent Labs  Lab 05/14/22 0559 05/15/22 0653 05/16/22 0121  ALBUMIN 3.0* 3.0* 3.0*    No results for input(s): LIPASE, AMYLASE in the last 168 hours. No results for input(s): AMMONIA in the last 168 hours. CBC: Recent Labs  Lab 05/12/22 0022 05/13/22 0322 05/14/22 0559 05/15/22 0653 05/16/22 0121  WBC 9.7 7.9 7.6 6.1 12.6*  NEUTROABS 5.7 4.1 4.6 3.0 11.4*  HGB 9.7* 11.1* 10.2* 9.3* 9.5*  HCT 29.1* 34.5* 30.5* 29.2* 29.9*  MCV 99.7 101.2* 100.3* 101.0* 103.5*  PLT 248 227 218 214 226    Cardiac Enzymes: No results for input(s): CKTOTAL, CKMB, CKMBINDEX, TROPONINI in the last 168 hours. CBG: No results for input(s): GLUCAP in the last 168 hours.  Studies/Results: DG CHEST PORT 1 VIEW  Result Date: 05/14/2022 CLINICAL DATA:  Shortness of breath Chest pain EXAM: PORTABLE CHEST - 1 VIEW COMPARISON:  05/06/2022 FINDINGS: Unchanged cardiomegaly. Interval improvement of pulmonary vascular congestion. Diffusely calcified thoracic aorta. The lungs are clear. IMPRESSION: 1. Unchanged cardiomegaly. 2.  Interval improvement of pulmonary vascular congestion. Electronically Signed   By: Miachel Roux M.D.   On: 05/14/2022 11:13   Medications:   ceFAZolin (ANCEF) IV Stopped (05/15/22 1824)    atorvastatin  80 mg Oral Daily   B-complex with vitamin C  1 tablet Oral Daily   And   folic acid  1 mg Oral Daily   Chlorhexidine Gluconate Cloth  6 each Topical Q0600   Chlorhexidine Gluconate Cloth  6 each Topical Q0600   clopidogrel  75 mg Oral Daily   darbepoetin (ARANESP) injection - DIALYSIS  40 mcg Intravenous Once in dialysis   escitalopram  10 mg Oral Daily   famotidine  20 mg Oral Daily   feeding supplement  237 mL Oral BID BM   heparin injection  (subcutaneous)  5,000 Units Subcutaneous Q8H   liver oil-zinc oxide   Topical Q4H   metoprolol tartrate  12.5 mg Oral BID   midodrine  5 mg Oral TID WC   sodium chloride flush  3 mL Intravenous Q12H   vancomycin  125 mg Oral QID   [START ON 05/17/2022] vancomycin  125 mg Oral Q12H

## 2022-05-16 NOTE — Progress Notes (Signed)
   VASCULAR SURGERY ASSESSMENT & PLAN:   S/P REVISION OF RIGHT AV GRAFT: She underwent revision of her right arm graft as she was having bleeding from the degenerative segment of the graft in the midportion of the upper arm.  This was on 04/27/2022.  I bypassed around this area and this is intact.  However the area where the graft was removed dehisced.  I partially closed this yesterday and the remaining portion is being packed.  The graft still has a good thrill.  Trying to avoid the need for a catheter as she has evidence of central venous issues given the extensive collaterals around the left shoulder.  She will need meticulous wound care.  ID: She is on intravenous vancomycin and Ancef.  There was no gross purulence at the site of the wound dehiscence although there is some erythema adjacent to the graft.  Ideally I think it would be best to continue intravenous antibiotics for another week.  Blood cultures so far are negative.  SUBJECTIVE:   No specific complaints this morning.  PHYSICAL EXAM:   Vitals:   05/15/22 2133 05/15/22 2333 05/16/22 0145 05/16/22 0526  BP: (!) 121/44 (!) 54/34 (!) 74/44 (!) 90/30  Pulse: (!) 112 66 77 73  Resp:  '18 17 17  '$ Temp: 98 F (36.7 C) 98 F (36.7 C) 98.3 F (36.8 C) 97.7 F (36.5 C)  TempSrc: Oral Oral Oral Oral  SpO2: 92% 93% 95% 98%  Weight:      Height:       I redressed her wound which has moderate drainage.  The graft does not appear to be exposed.  The graft still has a good thrill.  LABS:   Lab Results  Component Value Date   WBC 12.6 (H) 05/16/2022   HGB 9.5 (L) 05/16/2022   HCT 29.9 (L) 05/16/2022   MCV 103.5 (H) 05/16/2022   PLT 226 05/16/2022   PROBLEM LIST:    Principal Problem:   NSTEMI (non-ST elevated myocardial infarction) ([place]) Active Problems:   End-stage renal disease on hemodialysis (HCC)   C. difficile diarrhea   Acute on chronic diastolic (congestive) heart failure (HCC)   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HCC)    Physical deconditioning   Dermatitis   CURRENT MEDS:    atorvastatin  80 mg Oral Daily   B-complex with vitamin C  1 tablet Oral Daily   And   folic acid  1 mg Oral Daily   Chlorhexidine Gluconate Cloth  6 each Topical Q0600   Chlorhexidine Gluconate Cloth  6 each Topical Q0600   clopidogrel  75 mg Oral Daily   darbepoetin (ARANESP) injection - DIALYSIS  40 mcg Intravenous Once in dialysis   escitalopram  10 mg Oral Daily   famotidine  20 mg Oral Daily   feeding supplement  237 mL Oral BID BM   heparin injection (subcutaneous)  5,000 Units Subcutaneous Q8H   liver oil-zinc oxide   Topical Q4H   metoprolol tartrate  12.5 mg Oral BID   midodrine  5 mg Oral TID WC   sodium chloride flush  3 mL Intravenous Q12H   vancomycin  125 mg Oral QID   [START ON 05/17/2022] vancomycin  125 mg Oral Q12H    [name] Office: [phone] 05/16/2022

## 2022-05-16 NOTE — Progress Notes (Signed)
Patient received from HD. SBP still low at 87, new order was noted to do Bolus. Patient was alert and oriented during transfer. Will endorse and continue to monitor.

## 2022-05-17 DIAGNOSIS — I214 Non-ST elevation (NSTEMI) myocardial infarction: Secondary | ICD-10-CM | POA: Diagnosis not present

## 2022-05-17 LAB — CBC WITH DIFFERENTIAL/PLATELET
Abs Immature Granulocytes: 0.02 10*3/uL (ref 0.00–0.07)
Basophils Absolute: 0.1 10*3/uL (ref 0.0–0.1)
Basophils Relative: 1 %
Eosinophils Absolute: 0.1 10*3/uL (ref 0.0–0.5)
Eosinophils Relative: 2 %
HCT: 29 % — ABNORMAL LOW (ref 36.0–46.0)
Hemoglobin: 9.5 g/dL — ABNORMAL LOW (ref 12.0–15.0)
Immature Granulocytes: 0 %
Lymphocytes Relative: 17 %
Lymphs Abs: 1.1 10*3/uL (ref 0.7–4.0)
MCH: 33.2 pg (ref 26.0–34.0)
MCHC: 32.8 g/dL (ref 30.0–36.0)
MCV: 101.4 fL — ABNORMAL HIGH (ref 80.0–100.0)
Monocytes Absolute: 1.2 10*3/uL — ABNORMAL HIGH (ref 0.1–1.0)
Monocytes Relative: 18 %
Neutro Abs: 4 10*3/uL (ref 1.7–7.7)
Neutrophils Relative %: 62 %
Platelets: 171 10*3/uL (ref 150–400)
RBC: 2.86 MIL/uL — ABNORMAL LOW (ref 3.87–5.11)
RDW: 20.5 % — ABNORMAL HIGH (ref 11.5–15.5)
WBC: 6.5 10*3/uL (ref 4.0–10.5)
nRBC: 0.3 % — ABNORMAL HIGH (ref 0.0–0.2)

## 2022-05-17 LAB — COMPREHENSIVE METABOLIC PANEL
ALT: <5 U/L (ref 0–44)
AST: 26 U/L (ref 15–41)
Albumin: 3 g/dL — ABNORMAL LOW (ref 3.5–5.0)
Alkaline Phosphatase: 89 U/L (ref 38–126)
Anion gap: 13 (ref 5–15)
BUN: 15 mg/dL (ref 8–23)
CO2: 26 mmol/L (ref 22–32)
Calcium: 9.3 mg/dL (ref 8.9–10.3)
Chloride: 93 mmol/L — ABNORMAL LOW (ref 98–111)
Creatinine, Ser: 3.11 mg/dL — ABNORMAL HIGH (ref 0.44–1.00)
GFR, Estimated: 15 mL/min — ABNORMAL LOW (ref >60)
Glucose, Bld: 98 mg/dL (ref 70–99)
Potassium: 3.6 mmol/L (ref 3.5–5.1)
Sodium: 132 mmol/L — ABNORMAL LOW (ref 135–145)
Total Bilirubin: 0.5 mg/dL (ref 0.3–1.2)
Total Protein: 6.7 g/dL (ref 6.5–8.1)

## 2022-05-17 LAB — MAGNESIUM: Magnesium: 1.8 mg/dL (ref 1.7–2.4)

## 2022-05-17 LAB — PHOSPHORUS: Phosphorus: 2.7 mg/dL (ref 2.5–4.6)

## 2022-05-17 MED ORDER — CINACALCET HCL 30 MG PO TABS
30.0000 mg | ORAL_TABLET | ORAL | Status: DC
Start: 2022-05-18 — End: 2022-05-19
  Administered 2022-05-18: 30 mg via ORAL
  Filled 2022-05-17: qty 1

## 2022-05-17 NOTE — Plan of Care (Signed)
  Problem: Nutrition: Goal: Adequate nutrition will be maintained Outcome: Progressing   Problem: Pain Managment: Goal: General experience of comfort will improve Outcome: Progressing   Problem: Safety: Goal: Ability to remain free from injury will improve Outcome: Progressing   

## 2022-05-17 NOTE — Progress Notes (Addendum)
Subjective:  Used graft on HD yesterday; HD complicated by hypotension -received midodrine and small fluid bolus. Appears comfortable in bed. No complaints this am.    Objective Vital signs in last 24 hours: Vitals:   05/16/22 1953 05/16/22 2124 05/17/22 0521 05/17/22 0756  BP: (!) 88/22 (!) 93/46 (!) 75/49 (!) 111/36  Pulse: 93 84 100 98  Resp:   16 18  Temp: 98.5 F (36.9 C) 98.8 F (37.1 C) 98.9 F (37.2 C) 98.7 F (37.1 C)  TempSrc: Oral Oral Oral Oral  SpO2: 96% 93% 92% 100%  Weight:      Height:        Physical Exam: General: Elderly woman, chronically ill appearing  Heart: RRR no MRG Lungs: CTA, unlabored breathing Abdomen: soft non tender.  Extremities: No LE edema Dialysis Access: RUE AVG +bruit; lower portion dressed    OP Dialysis Orders: [facility]WF [facility]  4h  350/600  45kg   2/2 bath  P2 R AVG  --Hep none - mircera 50 mcg IV q 4 weeks - hectorol 2 ug IV tiw - sensipar 30 tiw po   Assessment/Plan:   ESRD - HD MWF. Back on schedule. Next HD 6/5. AVG S/p revision of R AV graft 5/15. Wound dehiscence with hematoma to lower portion of graft on 6/2. --repaired in OR by Dr. Scot Dock. Graft remains functional.  Acute hypoxic respiratory failure: Resolved with HD. On RA  NSTEMI - cardiology following --not currently interventional candidate due to comorbidities. Medical management per primary  Recent MSSA sepsis/ AVG infection -plan continue IV cefazolin until 6/13 with plans for oral Doxy 100 mg twice daily (due to CDI while on Ancef )thereafter for suppression.  BP/ volume - Volume ok. Occasional hypotension on HD. Midodrine 10 TID has been added.  Anemia - Hgb 9.5. Received Aranesp 40 on 6/3.  BMD ckd - Corrected Ca elevated. Holding VDRA. Continue Sensipar. Phos at goal. No binders.  C Diff + - on PO Vanc - per primary  Nutrition. Renal diet w/fluid restrictions.  continue supplements Dementia.   Lynnda Child PA-C [facility] 05/17/2022,9:12  AM   Labs: Basic Metabolic Panel: Recent Labs  Lab 05/15/22 0653 05/16/22 0121 05/17/22 0201  NA 135 133* 132*  K 4.5 5.5* 3.6  CL 97* 96* 93*  CO2 24 21* 26  GLUCOSE 88 144* 98  BUN 21 30* 15  CREATININE 4.75* 5.63* 3.11*  CALCIUM 9.4 9.6 9.3  PHOS 2.8 4.9* 2.7    Liver Function Tests: Recent Labs  Lab 05/15/22 0653 05/16/22 0121 05/17/22 0201  AST  --   --  26  ALT  --   --  <5  ALKPHOS  --   --  89  BILITOT  --   --  0.5  PROT  --   --  6.7  ALBUMIN 3.0* 3.0* 3.0*    No results for input(s): LIPASE, AMYLASE in the last 168 hours. No results for input(s): AMMONIA in the last 168 hours. CBC: Recent Labs  Lab 05/13/22 0322 05/14/22 0559 05/15/22 0653 05/16/22 0121 05/17/22 0201  WBC 7.9 7.6 6.1 12.6* 6.5  NEUTROABS 4.1 4.6 3.0 11.4* 4.0  HGB 11.1* 10.2* 9.3* 9.5* 9.5*  HCT 34.5* 30.5* 29.2* 29.9* 29.0*  MCV 101.2* 100.3* 101.0* 103.5* 101.4*  PLT 227 218 214 226 171    Cardiac Enzymes: No results for input(s): CKTOTAL, CKMB, CKMBINDEX, TROPONINI in the last 168 hours. CBG: No results for input(s): GLUCAP in the last  168 hours.  Studies/Results: No results found. Medications:   ceFAZolin (ANCEF) IV 1 g (05/16/22 1817)    atorvastatin  80 mg Oral Daily   B-complex with vitamin C  1 tablet Oral Daily   And   folic acid  1 mg Oral Daily   Chlorhexidine Gluconate Cloth  6 each Topical Q0600   Chlorhexidine Gluconate Cloth  6 each Topical Q0600   clopidogrel  75 mg Oral Daily   escitalopram  10 mg Oral Daily   famotidine  20 mg Oral Daily   feeding supplement  237 mL Oral BID BM   heparin injection (subcutaneous)  5,000 Units Subcutaneous Q8H   liver oil-zinc oxide   Topical Q4H   midodrine  10 mg Oral TID WC   sodium chloride flush  3 mL Intravenous Q12H   vancomycin  125 mg Oral QID   vancomycin  125 mg Oral Q12H

## 2022-05-17 NOTE — Progress Notes (Signed)
   VASCULAR SURGERY ASSESSMENT & PLAN:   S/P REVISION OF RIGHT AV GRAFT: The wound appears to be healing adequately.  Continue meticulous wound care.  The graft worked well yesterday for dialysis.  ID: She is on intravenous vancomycin and Ancef.  There was no gross purulence at the site of the wound dehiscence although there is some erythema adjacent to the graft.  Ideally I think it would be best to continue intravenous antibiotics for another week.  Blood cultures so far are negative.   SUBJECTIVE:   No complaints this morning.  PHYSICAL EXAM:   Vitals:   05/16/22 1807 05/16/22 1953 05/16/22 2124 05/17/22 0521  BP: (!) 87/36 (!) 88/22 (!) 93/46 (!) 75/49  Pulse: 72 93 84 100  Resp:    16  Temp: 98.7 F (37.1 C) 98.5 F (36.9 C) 98.8 F (37.1 C) 98.9 F (37.2 C)  TempSrc: Oral Oral Oral Oral  SpO2: 90% 96% 93% 92%  Weight:      Height:       I changed her dressing this morning.  There was minimal drainage. Good thrill in right upper arm graft.  LABS:   Lab Results  Component Value Date   WBC 6.5 05/17/2022   HGB 9.5 (L) 05/17/2022   HCT 29.0 (L) 05/17/2022   MCV 101.4 (H) 05/17/2022   PLT 171 05/17/2022   Lab Results  Component Value Date   CREATININE 3.11 (H) 05/17/2022   Lab Results  Component Value Date   INR 1.2 05/06/2022    PROBLEM LIST:    Principal Problem:   NSTEMI (non-ST elevated myocardial infarction) [facility]) Active Problems:   End-stage renal disease on hemodialysis (HCC)   C. difficile diarrhea   Hypotension   Acute on chronic diastolic (congestive) heart failure (HCC)   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HCC)   Physical deconditioning   Dermatitis   CURRENT MEDS:    atorvastatin  80 mg Oral Daily   B-complex with vitamin C  1 tablet Oral Daily   And   folic acid  1 mg Oral Daily   Chlorhexidine Gluconate Cloth  6 each Topical Q0600   Chlorhexidine Gluconate Cloth  6 each Topical Q0600   clopidogrel  75 mg Oral Daily   escitalopram  10 mg  Oral Daily   famotidine  20 mg Oral Daily   feeding supplement  237 mL Oral BID BM   heparin injection (subcutaneous)  5,000 Units Subcutaneous Q8H   liver oil-zinc oxide   Topical Q4H   midodrine  10 mg Oral TID WC   sodium chloride flush  3 mL Intravenous Q12H   vancomycin  125 mg Oral QID   vancomycin  125 mg Oral Q12H    [name] Office: [phone] 05/17/2022

## 2022-05-17 NOTE — Progress Notes (Signed)
PROGRESS NOTE    [name]  YNW:[id number] DOB: [birth year] DOA: 05/06/2022 PCP: Renford Dills, MD  Chief Complaint  Patient presents with   Shortness of Breath    Brief Narrative:  Ms. Harkey is Kile Kabler 71 yo female with PMH dCHF, ESRD on HD, CVA, CAD, HLD, HTN, GERD who presented with shortness of breath.  She was recently hospitalized from 04/26/2022 until 05/04/2022 with septic shock due to staph bacteremia from infected AV graft.  She was discharged on Ancef to complete course followed by chronic oral suppression. On admission she was found to be somnolent, hypoxic, and volume overloaded. Further work-up revealed elevated troponin which further uptrended consistent with NSTEMI.  She was evaluated by cardiology and also started on Patrick Sohm heparin drip on admission.  She was not considered [facility] [place] good interventional candidate.    Assessment & Plan:   Principal Problem:   NSTEMI (non-ST elevated myocardial infarction) (HCC) Active Problems:   Hypotension   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HCC)   C. difficile diarrhea   End-stage renal disease on hemodialysis (HCC)   Dermatitis   Acute on chronic diastolic (congestive) heart failure (HCC)   Physical deconditioning   Assessment and Plan: * NSTEMI (non-ST elevated myocardial infarction) (HCC) - SOB and CP on admission; trop elevated with peak at 1,008 - s/p 48 hr of heparin drip completed - overnight 5/31-6/1 she c/o SOB/chest discomfort, like her chest was exploding (sx lasted about 30 min to 1 hour, now resolved).  EKG without acute ST-T wave changes, troponins elevated/rising again.  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  She's not candidate for surgical revascularization.  Currently attempting medical therapy.  Hypotension limits use of antianginal meds.  She likely has additional myocardium ischemic and at risk and will continue to have episodes of angina pectoris  with "piecemeal" NSTEMI '"for weeks to come".  Recommending treatment with plavix, highest tolerated beta blocker by BP, high dose statin.  Can use sublingual nitro prn, no long acting nitrates due to BP.  Recommended consideration of palliative care c/s. - continue statin and BB (hold BB for BP) - per cardiology, not currently an interventional candidate due to multiple medical issues currently  -Plavix monotherapy okay per cardiology  Hypotension Seems improved today, but as low as 75 systolic overnight Getting BP from leg cuff, unclear how accurate - she's asymptomatic Afebrile, white count is up mildly today, possibly reactive after surgery Stop metoprolol Midodrine increased to 10 mg   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HCC) - hospitalized 5/14 - 5/22; AV graft infection, MSSA bacteremia; evaluated by ID. Plan for Ancef post HD MWF until 6/13 then oral doxy 100 mg BID (due to CDI while on Ancef) - continue Ancef  - appreciate ID evaluation after re-admission  - Follow-up repeat blood cultures obtained on 05/06/2022: NGTD x 5 days  C. difficile diarrhea - s/p recent diarrhea; C diff Ag positive, toxin negative, PCR positive - started on oral Vanc - per ID (5/25) continue oral vancomycin 125 mg QID x 10 days then 125 mg BID for 7 days after completion of IV abx - diarrhea improving, follow  End-stage renal disease on hemodialysis (HCC) - Continue chronic midodrine.  BP stable - Nephrology following, continue inpatient dialysis per nephrology - dehiscence of incision R AV graft noted 6/2  - s/p exploration of left arm graft and evacuation of hematoma 6/2  Dermatitis - skin breakdown from ongoing diarrhea - WOC consult appreciated -  did not respond much to Gerhardt's cream, now on desitin trial   Acute on chronic diastolic (congestive) heart failure (HCC) - Volume removal planned with HD - CXR on admission consistent with volume overload  Physical deconditioning - Planning for  dis[facility] back to [facility] at discharge.  Will need insurance auth      DVT prophylaxis: heparin Code Status: DNR Family Communication: none at bedsdie Disposition:   Status is: Inpatient Remains inpatient appropriate because: continued diarrhea   Consultants:  ID Nephrology cardiology  Procedures:  none  Antimicrobials:  Anti-infectives (From admission, onward)    Start     Dose/Rate Route Frequency Ordered Stop   05/17/22 1000  vancomycin (VANCOCIN) 50 mg/mL oral solution SOLN 125 mg  Status:  Discontinued        125 mg Oral Every 12 hours 05/07/22 1314 05/15/22 1913   05/17/22 1000  vancomycin (VANCOCIN) capsule 125 mg        125 mg Oral Every 12 hours 05/15/22 1913 06/03/22 0959   05/15/22 1800  ceFAZolin (ANCEF) IVPB 1 g/50 mL premix        1 g 100 mL/hr over 30 Minutes Intravenous Every 24 hours 05/15/22 1639 05/27/22 1759   05/15/22 1345  ceFAZolin (ANCEF) IVPB 1 g/50 mL premix        1 g 100 mL/hr over 30 Minutes Intravenous  Once 05/15/22 1343 05/15/22 1405   05/15/22 1338  ceFAZolin (ANCEF) 1-4 GM/50ML-% IVPB       Note to Pharmacy: [name] E: cabinet override      05/15/22 1338 05/15/22 1423   05/08/22 1800  ceFAZolin (ANCEF) IVPB 2g/100 mL premix  Status:  Discontinued        2 g 200 mL/hr over 30 Minutes Intravenous Every M-W-F (1800) 05/07/22 1016 05/15/22 1639   05/08/22 1200  ceFAZolin (ANCEF) IVPB 1 g/50 mL premix  Status:  Discontinued        1 g 100 mL/hr over 30 Minutes Intravenous Every M-W-F (Hemodialysis) 05/07/22 0935 05/07/22 1016   05/07/22 1030  vancomycin (VANCOCIN) capsule 125 mg        125 mg Oral 4 times daily 05/07/22 0935 05/17/22 0959   05/06/22 1800  vancomycin (VANCOREADY) IVPB 500 mg/100 mL  Status:  Discontinued        500 mg 100 mL/hr over 60 Minutes Intravenous Every M-W-F (Hemodialysis) 05/06/22 1218 05/07/22 0935   05/06/22 1800  ceFEPIme (MAXIPIME) 2 g in sodium chloride 0.9 % 100 mL IVPB  Status:  Discontinued         2 g 200 mL/hr over 30 Minutes Intravenous Every M-W-F (Hemodialysis) 05/06/22 1218 05/07/22 0935   05/06/22 1145  ceFEPIme (MAXIPIME) 2 g in sodium chloride 0.9 % 100 mL IVPB        2 g 200 mL/hr over 30 Minutes Intravenous  Once 05/06/22 1143 05/06/22 1324   05/06/22 1145  vancomycin (VANCOCIN) IVPB 1000 mg/200 mL premix        1,000 mg 200 mL/hr over 60 Minutes Intravenous  Once 05/06/22 1143 05/06/22 1436       Subjective: No new complaints  Objective: Vitals:   05/16/22 2124 05/17/22 0521 05/17/22 0756 05/17/22 1400  BP: (!) 93/46 (!) 75/49 (!) 111/36 (!) 112/39  Pulse: 84 100 98 81  Resp:  16 18 20   Temp: 98.8 F (37.1 C) 98.9 F (37.2 C) 98.7 F (37.1 C) 98.3 F (36.8 C)  TempSrc: Oral Oral Oral Oral  SpO2:  93% 92% 100% 96%  Weight:      Height:        Intake/Output Summary (Last 24 hours) at 05/17/2022 1539 Last data filed at 05/17/2022 1300 Gross per 24 hour  Intake 290 ml  Output 0 ml  Net 290 ml   Filed Weights   05/14/22 0551 05/15/22 1323  Weight: 79.8 kg 79.8 kg    Examination:  General: No acute distress. Lungs: unlabored Neurological: Alert and oriented 3. Moves all extremities 4 with equal strength. Cranial nerves II through XII grossly intact. Extremities: No clubbing or cyanosis. No edema.  Data Reviewed: I have personally reviewed following labs and imaging studies  CBC: Recent Labs  Lab 05/13/22 0322 05/14/22 0559 05/15/22 0653 05/16/22 0121 05/17/22 0201  WBC 7.9 7.6 6.1 12.6* 6.5  NEUTROABS 4.1 4.6 3.0 11.4* 4.0  HGB 11.1* 10.2* 9.3* 9.5* 9.5*  HCT 34.5* 30.5* 29.2* 29.9* 29.0*  MCV 101.2* 100.3* 101.0* 103.5* 101.4*  PLT 227 218 214 226 171    Basic Metabolic Panel: Recent Labs  Lab 05/13/22 0322 05/14/22 0559 05/15/22 0653 05/16/22 0121 05/17/22 0201  NA 135 134* 135 133* 132*  K 4.5 4.1 4.5 5.5* 3.6  CL 99 95* 97* 96* 93*  CO2 23 28 24  21* 26  GLUCOSE 89 88 88 144* 98  BUN 23 10 21  30* 15  CREATININE 5.05*  3.09* 4.75* 5.63* 3.11*  CALCIUM 9.6 9.6 9.4 9.6 9.3  MG 2.3 2.1 2.1 2.2 1.8  PHOS 2.6 1.9* 2.8 4.9* 2.7    GFR: Estimated Creatinine Clearance: 16.2 mL/min (Teena Mangus) (by C-G formula based on SCr of 3.11 mg/dL (H)).  Liver Function Tests: Recent Labs  Lab 05/13/22 0322 05/14/22 0559 05/15/22 0653 05/16/22 0121 05/17/22 0201  AST  --   --   --   --  26  ALT  --   --   --   --  <5  ALKPHOS  --   --   --   --  89  BILITOT  --   --   --   --  0.5  PROT  --   --   --   --  6.7  ALBUMIN 3.0* 3.0* 3.0* 3.0* 3.0*    CBG: No results for input(s): GLUCAP in the last 168 hours.   No results found for this or any previous visit (from the past 240 hour(s)).        Radiology Studies: No results found.      Scheduled Meds:  atorvastatin  80 mg Oral Daily   B-complex with vitamin C  1 tablet Oral Daily   And   folic acid  1 mg Oral Daily   Chlorhexidine Gluconate Cloth  6 each Topical Q0600   Chlorhexidine Gluconate Cloth  6 each Topical Q0600   [START ON 05/18/2022] cinacalcet  30 mg Oral Q M,W,F-1800   clopidogrel  75 mg Oral Daily   escitalopram  10 mg Oral Daily   famotidine  20 mg Oral Daily   feeding supplement  237 mL Oral BID BM   heparin injection (subcutaneous)  5,000 Units Subcutaneous Q8H   liver oil-zinc oxide   Topical Q4H   midodrine  10 mg Oral TID WC   sodium chloride flush  3 mL Intravenous Q12H   vancomycin  125 mg Oral Q12H   Continuous Infusions:   ceFAZolin (ANCEF) IV 1 g (05/16/22 1817)     LOS: 11 days    Time spent: over  30 min    Lacretia Nicks, MD [facility]   To contact the attending provider between 7A-7P or the covering provider during after hours 7P-7A, please log into the web site [email or web address] and access using universal [place] password for that web site. If you do not have the password, please call the hospital operator.  05/17/2022, 3:39 PM

## 2022-05-18 ENCOUNTER — Inpatient Hospital Stay (HOSPITAL_COMMUNITY): Payer: Medicare Other

## 2022-05-18 DIAGNOSIS — I214 Non-ST elevation (NSTEMI) myocardial infarction: Secondary | ICD-10-CM | POA: Diagnosis not present

## 2022-05-18 LAB — CBC WITH DIFFERENTIAL/PLATELET
Abs Immature Granulocytes: 0.03 10*3/uL (ref 0.00–0.07)
Basophils Absolute: 0 10*3/uL (ref 0.0–0.1)
Basophils Relative: 1 %
Eosinophils Absolute: 0.1 10*3/uL (ref 0.0–0.5)
Eosinophils Relative: 1 %
HCT: 29.9 % — ABNORMAL LOW (ref 36.0–46.0)
Hemoglobin: 10 g/dL — ABNORMAL LOW (ref 12.0–15.0)
Immature Granulocytes: 0 %
Lymphocytes Relative: 19 %
Lymphs Abs: 1.4 10*3/uL (ref 0.7–4.0)
MCH: 33.9 pg (ref 26.0–34.0)
MCHC: 33.4 g/dL (ref 30.0–36.0)
MCV: 101.4 fL — ABNORMAL HIGH (ref 80.0–100.0)
Monocytes Absolute: 1.5 10*3/uL — ABNORMAL HIGH (ref 0.1–1.0)
Monocytes Relative: 21 %
Neutro Abs: 4.3 10*3/uL (ref 1.7–7.7)
Neutrophils Relative %: 58 %
Platelets: 182 10*3/uL (ref 150–400)
RBC: 2.95 MIL/uL — ABNORMAL LOW (ref 3.87–5.11)
RDW: 20.3 % — ABNORMAL HIGH (ref 11.5–15.5)
WBC: 7.3 10*3/uL (ref 4.0–10.5)
nRBC: 0 % (ref 0.0–0.2)

## 2022-05-18 LAB — PHOSPHORUS: Phosphorus: 3.1 mg/dL (ref 2.5–4.6)

## 2022-05-18 LAB — COMPREHENSIVE METABOLIC PANEL
ALT: <5 U/L (ref 0–44)
AST: 22 U/L (ref 15–41)
Albumin: 2.9 g/dL — ABNORMAL LOW (ref 3.5–5.0)
Alkaline Phosphatase: 77 U/L (ref 38–126)
Anion gap: 15 (ref 5–15)
BUN: 27 mg/dL — ABNORMAL HIGH (ref 8–23)
CO2: 24 mmol/L (ref 22–32)
Calcium: 9.8 mg/dL (ref 8.9–10.3)
Chloride: 96 mmol/L — ABNORMAL LOW (ref 98–111)
Creatinine, Ser: 5.12 mg/dL — ABNORMAL HIGH (ref 0.44–1.00)
GFR, Estimated: 8 mL/min — ABNORMAL LOW (ref >60)
Glucose, Bld: 96 mg/dL (ref 70–99)
Potassium: 4 mmol/L (ref 3.5–5.1)
Sodium: 135 mmol/L (ref 135–145)
Total Bilirubin: 0.8 mg/dL (ref 0.3–1.2)
Total Protein: 6.9 g/dL (ref 6.5–8.1)

## 2022-05-18 LAB — MAGNESIUM: Magnesium: 1.9 mg/dL (ref 1.7–2.4)

## 2022-05-18 MED ORDER — VANCOMYCIN HCL 125 MG PO CAPS
125.0000 mg | ORAL_CAPSULE | Freq: Four times a day (QID) | ORAL | Status: AC
  Administered 2022-05-18 – 2022-05-21 (×11): 125 mg via ORAL
  Filled 2022-05-18 (×16): qty 1

## 2022-05-18 MED ORDER — ALTEPLASE 2 MG IJ SOLR: 2.0000 mg | Freq: Once | INTRAMUSCULAR | Status: DC | PRN

## 2022-05-18 MED ORDER — SODIUM CHLORIDE 0.9 % IV BOLUS
250.0000 mL | Freq: Once | INTRAVENOUS | Status: AC
  Administered 2022-05-18: 250 mL via INTRAVENOUS

## 2022-05-18 MED ORDER — LIDOCAINE-PRILOCAINE 2.5-2.5 % EX CREA: 1.0000 "application " | TOPICAL_CREAM | CUTANEOUS | Status: DC | PRN

## 2022-05-18 MED ORDER — LIDOCAINE HCL (PF) 1 % IJ SOLN: 5.0000 mL | INTRAMUSCULAR | Status: DC | PRN

## 2022-05-18 MED ORDER — ANTICOAGULANT SODIUM CITRATE 4% (200MG/5ML) IV SOLN: 5.0000 mL | Status: DC | PRN

## 2022-05-18 MED ORDER — PENTAFLUOROPROP-TETRAFLUOROETH EX AERO: 1.0000 "application " | INHALATION_SPRAY | CUTANEOUS | Status: DC | PRN

## 2022-05-18 MED ORDER — HEPARIN SODIUM (PORCINE) 1000 UNIT/ML DIALYSIS: 1000.0000 [IU] | INTRAMUSCULAR | Status: DC | PRN

## 2022-05-18 MED ORDER — VANCOMYCIN HCL 125 MG PO CAPS
125.0000 mg | ORAL_CAPSULE | Freq: Two times a day (BID) | ORAL | Status: DC
  Administered 2022-05-22: 125 mg via ORAL
  Filled 2022-05-18 (×2): qty 1

## 2022-05-18 NOTE — Progress Notes (Addendum)
Report given to HD RN at this time. Made RN aware of pt's recent hypotension and that nephrology is aware of most recent blood pressures.

## 2022-05-18 NOTE — Progress Notes (Signed)
   VASCULAR SURGERY ASSESSMENT & PLAN:   S/P REVISION RIGHT AV GRAFT: The wound continues to improve.  There is minimal drainage.  Her graft has been working for dialysis.  Continue dressing changes with a moist 2 x 2.  She will need continuous meticulous wound care.  When she is ready for discharge we can follow this as an outpatient and the dressing changes can be done by home health.   SUBJECTIVE:   No complaints this morning.  PHYSICAL EXAM:   Vitals:   05/17/22 1646 05/17/22 2113 05/18/22 0500 05/18/22 0553  BP: (!) 109/39 (!) 88/43  (!) 83/49  Pulse: 82 80  79  Resp: '18 17  16  '$ Temp: 99.1 F (37.3 C) 98.6 F (37 C)  98 F (36.7 C)  TempSrc: Oral Oral  Oral  SpO2: 95% 91%  96%  Weight:   81.6 kg   Height:       Her wound looks good and takes minimal packing.  Her graft has a good thrill.  LABS:   Lab Results  Component Value Date   WBC 6.5 05/17/2022   HGB 9.5 (L) 05/17/2022   HCT 29.0 (L) 05/17/2022   MCV 101.4 (H) 05/17/2022   PLT 171 05/17/2022   Lab Results  Component Value Date   CREATININE 3.11 (H) 05/17/2022   Lab Results  Component Value Date   INR 1.2 05/06/2022    PROBLEM LIST:    Principal Problem:   NSTEMI (non-ST elevated myocardial infarction) [facility]) Active Problems:   End-stage renal disease on hemodialysis (HCC)   C. difficile diarrhea   Hypotension   Acute on chronic diastolic (congestive) heart failure (HCC)   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HCC)   Physical deconditioning   Dermatitis   CURRENT MEDS:    atorvastatin  80 mg Oral Daily   B-complex with vitamin C  1 tablet Oral Daily   And   folic acid  1 mg Oral Daily   Chlorhexidine Gluconate Cloth  6 each Topical Q0600   Chlorhexidine Gluconate Cloth  6 each Topical Q0600   cinacalcet  30 mg Oral Q M,W,F-1800   clopidogrel  75 mg Oral Daily   escitalopram  10 mg Oral Daily   famotidine  20 mg Oral Daily   feeding supplement  237 mL Oral BID BM   heparin injection (subcutaneous)   5,000 Units Subcutaneous Q8H   liver oil-zinc oxide   Topical Q4H   midodrine  10 mg Oral TID WC   sodium chloride flush  3 mL Intravenous Q12H   vancomycin  125 mg Oral Q12H    [name] Office: [phone] 05/18/2022

## 2022-05-18 NOTE — Progress Notes (Signed)
Subjective: Seen in room, no current chest pain or shortness of breath, asking for breakfast,, for dialysis today on schedule.  Noted BPs in 80s asymptomatic, IV fluids being given, keep even on HD, midodrine 10 mg 3 times daily started over the weekend.  Was on 5 mg 3 times daily at OP  Objective Vital signs in last 24 hours: Vitals:   05/17/22 2113 05/18/22 0500 05/18/22 0553 05/18/22 0751  BP: (!) 88/43  (!) 83/49 (!) 86/47  Pulse: 80  79 82  Resp: '17  16 16  '$ Temp: 98.6 F (37 C)  98 F (36.7 C) 98 F (36.7 C)  TempSrc: Oral  Oral Oral  SpO2: 91%  96% 99%  Weight:  81.6 kg    Height:       Weight change:   Physical Exam: General: Alert elderly female chronically ill pleasant, NAD Heart: RRR no MRG Lungs: CTA, unlabored breathing Abdomen: NABS, soft NTND Extremities: No pedal edema Dialysis Access: RUA AVGG + bruit, with dry clean dressing.  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  Now appearing euvolemic low blood pressure requiring IV fluid this morning midodrine 10 mg 3 times daily started over weekend.  Noted last week was having copious diarrhea with procedure for recurrent rectal tube now resolved, may be playing catch up with loss of volume./Versus cardiac issue ESRD - on HD MWF.  Is on schedule.  HD today K4.0  C Diff + - per PMD /earlier in admit required rectal tube because of copious diarrhea/diarrhea resolved.  Plan for admit NSTEMI -  cardiology f evaluated= not interventional candidate due to multiple medical issues/Plavix/med rx  Recent admit MSSA sepsis/ AVG infection -AVG still functional.  Noted healing per Dr. Scot Dock this a.m. plan continue IV cefazolin until 6/13 with plans for oral  Doxy 100 mg twice daily (due to CDI while on Ancef )thereafter for suppression/ ID reeval noted per admit BP/ volume -see #1 , as OP also  had problems with low blood pressures using midodrine 10 mg daily.   BED  wts Questionable   Anemia esrd -Hgb 10.0 this a.m. received Aranesp 40 on 6/03  BMD ckd - C Ca high, phos'@goal'$  hold ing hectorol with ^ ca /stopped Renvela with low phos prior wk required p.o.  P supplement/ also since last week held Sensipar (review op PTH recent=96<186<726) follow-up lab trend Nutrition 2.9 ALB renal diet w/fluid  Dementia/debility= at baseline and at [facility] for SNF   Ernest Haber, PA-C [place] [phone] 05/18/2022,9:12 AM  LOS: 12 days   Labs: Basic Metabolic Panel: Recent Labs  Lab 05/16/22 0121 05/17/22 0201 05/18/22 0712  NA 133* 132* 135  K 5.5* 3.6 4.0  CL 96* 93* 96*  CO2 21* 26 24  GLUCOSE 144* 98 96  BUN 30* 15 27*  CREATININE 5.63* 3.11* 5.12*  CALCIUM 9.6 9.3 9.8  PHOS 4.9* 2.7 3.1   Liver Function Tests: Recent Labs  Lab 05/16/22 0121 05/17/22 0201 05/18/22 0712  AST  --  26 22  ALT  --  <5 <5  ALKPHOS  --  89 77  BILITOT  --  0.5 0.8  PROT  --  6.7 6.9  ALBUMIN 3.0* 3.0* 2.9*   No results for input(s): LIPASE, AMYLASE in the last 168 hours. No results for input(s): AMMONIA in the last 168 hours. CBC: Recent Labs  Lab 05/14/22 0559 05/15/22 0653 05/16/22 0121 05/17/22 0201 05/18/22 0712  WBC 7.6 6.1 12.6* 6.5 7.3  NEUTROABS 4.6 3.0 11.4* 4.0 4.3  HGB 10.2* 9.3* 9.5* 9.5* 10.0*  HCT 30.5* 29.2* 29.9* 29.0* 29.9*  MCV 100.3* 101.0* 103.5* 101.4* 101.4*  PLT 218 214 226 171 182   Cardiac Enzymes: No results for input(s): CKTOTAL, CKMB, CKMBINDEX, TROPONINI in the last 168 hours. CBG: No results for input(s): GLUCAP in the last 168 hours.  Studies/Results: No results found. Medications:   ceFAZolin (ANCEF) IV 1 g (05/17/22 1828)    atorvastatin  80 mg Oral Daily   B-complex with  vitamin C  1 tablet Oral Daily   And   folic acid  1 mg Oral Daily   Chlorhexidine Gluconate Cloth  6 each Topical Q0600   Chlorhexidine Gluconate Cloth  6 each Topical Q0600   cinacalcet  30 mg Oral Q M,W,F-1800   clopidogrel  75 mg Oral Daily   escitalopram  10 mg Oral Daily   famotidine  20 mg Oral Daily   feeding supplement  237 mL Oral BID BM   heparin injection (subcutaneous)  5,000 Units Subcutaneous Q8H   liver oil-zinc oxide   Topical Q4H   midodrine  10 mg Oral TID WC   sodium chloride flush  3 mL Intravenous Q12H   vancomycin  125 mg Oral Q12H

## 2022-05-18 NOTE — Progress Notes (Signed)
Pt was moving

## 2022-05-18 NOTE — Progress Notes (Signed)
Pt transported to Dialysis via bed. Pt awake alert and stable at baseline.

## 2022-05-18 NOTE — Progress Notes (Signed)
PROGRESS NOTE    [name]  ZOX:[id number] DOB: October 14, 1951 DOA: 05/06/2022 PCP: Renford Dills, MD  Chief Complaint  Patient presents with   Shortness of Breath    Brief Narrative:  [name] is [name] 71 yo female with PMH dCHF, ESRD on HD, CVA, CAD, HLD, HTN, GERD who presented with shortness of breath.  She was recently hospitalized from 04/26/2022 until 05/04/2022 with septic shock due to staph bacteremia from infected AV graft.  She was discharged on Ancef to complete course followed by chronic oral suppression. On admission she was found to be somnolent, hypoxic, and volume overloaded. Further work-up revealed elevated troponin which further uptrended consistent with NSTEMI.  She was evaluated by cardiology and also started on [name] heparin drip on admission.  She was not considered [name] good interventional candidate.    Assessment & Plan:   Principal Problem:   NSTEMI (non-ST elevated myocardial infarction) (HCC) Active Problems:   Hypotension   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HCC)   C. difficile diarrhea   End-stage renal disease on hemodialysis (HCC)   Dermatitis   Acute on chronic diastolic (congestive) heart failure (HCC)   Physical deconditioning   Assessment and Plan: * NSTEMI (non-ST elevated myocardial infarction) (HCC) - SOB and CP on admission; trop elevated with peak at 1,008 - s/p 48 hr of heparin drip completed - overnight 5/31-6/1 she c/o SOB/chest discomfort, like her chest was exploding (sx lasted about 30 min to 1 hour, now resolved).  EKG without acute ST-T wave changes, troponins elevated/rising again.  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  She's not candidate for surgical revascularization.  Currently attempting medical therapy.  Hypotension limits use of antianginal meds.  She likely has additional myocardium ischemic and at risk and will continue to have episodes of angina pectoris  with "piecemeal" NSTEMI '"for weeks to come".  Recommending treatment with plavix, highest tolerated beta blocker by BP, high dose statin.  Can use sublingual nitro prn, no long acting nitrates due to BP.  Recommended consideration of palliative care c/s. - continue statin and BB (hold BB for BP) - per cardiology, not currently an interventional candidate due to multiple medical issues currently  -Plavix monotherapy okay per cardiology  Hypotension SBP in 80's this AM, 250 cc bolus given Continue midodrine 10 mg TID (increased dose) Getting BP from leg cuff, unclear how accurate - she's generally seemed asymptomatic Stop metoprolol   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HCC) - hospitalized 5/14 - 5/22; AV graft infection, MSSA bacteremia; evaluated by ID. Plan for Ancef post HD MWF until 6/13 then oral doxy 100 mg BID (due to CDI while on Ancef) - continue Ancef  - appreciate ID evaluation after re-admission  - Follow-up repeat blood cultures obtained on 05/06/2022: NGTD x 5 days - s/p exploration of L arm graft and evacuation of hematoma on 6/2 for wound dehiscence adjacent to L arm graft - Dr. Edilia West recommending dressing changes with moist 2x2.  C. difficile diarrhea - s/p recent diarrhea; C diff Ag positive, toxin negative, PCR positive - started on oral Vanc - per ID (5/25) continue oral vancomycin 125 mg QID x 10 days then 125 mg BID for 7 days after completion of IV abx - more diarrhea recently with mild abdominal discomfort --- will review abx with ID  End-stage renal disease on hemodialysis (HCC) - Continue chronic midodrine.  BP stable - Nephrology following, continue inpatient dialysis per nephrology - dehiscence  of incision R AV graft noted 6/2  - s/p exploration of left arm graft and evacuation of hematoma 6/2  Dermatitis - skin breakdown from ongoing diarrhea - WOC consult appreciated - did not respond much to Gerhardt's cream, now on desitin trial   Acute on chronic diastolic  (congestive) heart failure (HCC) - Volume removal planned with HD - CXR on admission consistent with volume overload  Physical deconditioning - Planning for discharge back to [facility] at discharge.  Will need insurance auth      DVT prophylaxis: heparin Code Status: DNR Family Communication: none at bedsdie Disposition:   Status is: Inpatient Remains inpatient appropriate because: continued diarrhea   Consultants:  ID Nephrology cardiology  Procedures:  none  Antimicrobials:  Anti-infectives (From admission, onward)    Start     Dose/Rate Route Frequency Ordered Stop   05/17/22 1000  vancomycin (VANCOCIN) 50 mg/mL oral solution SOLN 125 mg  Status:  Discontinued        125 mg Oral Every 12 hours 05/07/22 1314 05/15/22 1913   05/17/22 1000  vancomycin (VANCOCIN) capsule 125 mg        125 mg Oral Every 12 hours 05/15/22 1913 06/03/22 0959   05/15/22 1800  ceFAZolin (ANCEF) IVPB 1 g/50 mL premix        1 g 100 mL/hr over 30 Minutes Intravenous Every 24 hours 05/15/22 1639 05/27/22 1759   05/15/22 1345  ceFAZolin (ANCEF) IVPB 1 g/50 mL premix        1 g 100 mL/hr over 30 Minutes Intravenous  Once 05/15/22 1343 05/15/22 1405   05/15/22 1338  ceFAZolin (ANCEF) 1-4 GM/50ML-% IVPB       Note to Pharmacy: [name] E: cabinet override      05/15/22 1338 05/15/22 1423   05/08/22 1800  ceFAZolin (ANCEF) IVPB 2g/100 mL premix  Status:  Discontinued        2 g 200 mL/hr over 30 Minutes Intravenous Every M-W-F (1800) 05/07/22 1016 05/15/22 1639   05/08/22 1200  ceFAZolin (ANCEF) IVPB 1 g/50 mL premix  Status:  Discontinued        1 g 100 mL/hr over 30 Minutes Intravenous Every M-W-F (Hemodialysis) 05/07/22 0935 05/07/22 1016   05/07/22 1030  vancomycin (VANCOCIN) capsule 125 mg        125 mg Oral 4 times daily 05/07/22 0935 05/17/22 0959   05/06/22 1800  vancomycin (VANCOREADY) IVPB 500 mg/100 mL  Status:  Discontinued        500 mg 100 mL/hr over 60 Minutes Intravenous  Every M-W-F (Hemodialysis) 05/06/22 1218 05/07/22 0935   05/06/22 1800  ceFEPIme (MAXIPIME) 2 g in sodium chloride 0.9 % 100 mL IVPB  Status:  Discontinued        2 g 200 mL/hr over 30 Minutes Intravenous Every M-W-F (Hemodialysis) 05/06/22 1218 05/07/22 0935   05/06/22 1145  ceFEPIme (MAXIPIME) 2 g in sodium chloride 0.9 % 100 mL IVPB        2 g 200 mL/hr over 30 Minutes Intravenous  Once 05/06/22 1143 05/06/22 1324   05/06/22 1145  vancomycin (VANCOCIN) IVPB 1000 mg/200 mL premix        1,000 mg 200 mL/hr over 60 Minutes Intravenous  Once 05/06/22 1143 05/06/22 1436       Subjective: C/o diarrhea and abdominal discomfort   Objective: Vitals:   05/18/22 0500 05/18/22 0553 05/18/22 0751 05/18/22 1043  BP:  (!) 83/49 (!) 86/47 (!) 98/43  Pulse:  79 82 77  Resp:  16 16   Temp:  98 F (36.7 C) 98 F (36.7 C)   TempSrc:  Oral Oral   SpO2:  96% 99%   Weight: 81.6 kg     Height:        Intake/Output Summary (Last 24 hours) at 05/18/2022 1428 Last data filed at 05/18/2022 0818 Gross per 24 hour  Intake 250 ml  Output --  Net 250 ml   Filed Weights   05/15/22 1323 05/18/22 0500  Weight: 79.8 kg 81.6 kg    Examination:  General: No acute distress. Cardiovascular: RRR Lungs: unlabored Abdomen: mild abdominal discomfort Neurological: Alert and oriented 3. Moves all extremities 4 with equal strength. Cranial nerves II through XII grossly intact. Extremities: No clubbing or cyanosis. No edema.   Data Reviewed: I have personally reviewed following labs and imaging studies  CBC: Recent Labs  Lab 05/14/22 0559 05/15/22 0653 05/16/22 0121 05/17/22 0201 05/18/22 0712  WBC 7.6 6.1 12.6* 6.5 7.3  NEUTROABS 4.6 3.0 11.4* 4.0 4.3  HGB 10.2* 9.3* 9.5* 9.5* 10.0*  HCT 30.5* 29.2* 29.9* 29.0* 29.9*  MCV 100.3* 101.0* 103.5* 101.4* 101.4*  PLT 218 214 226 171 182    Basic Metabolic Panel: Recent Labs  Lab 05/14/22 0559 05/15/22 0653 05/16/22 0121 05/17/22 0201  05/18/22 0712  NA 134* 135 133* 132* 135  K 4.1 4.5 5.5* 3.6 4.0  CL 95* 97* 96* 93* 96*  CO2 28 24 21* 26 24  GLUCOSE 88 88 144* 98 96  BUN 10 21 30* 15 27*  CREATININE 3.09* 4.75* 5.63* 3.11* 5.12*  CALCIUM 9.6 9.4 9.6 9.3 9.8  MG 2.1 2.1 2.2 1.8 1.9  PHOS 1.9* 2.8 4.9* 2.7 3.1    GFR: Estimated Creatinine Clearance: 10 mL/min (Coleson Kant) (by C-G formula based on SCr of 5.12 mg/dL (H)).  Liver Function Tests: Recent Labs  Lab 05/14/22 0559 05/15/22 0653 05/16/22 0121 05/17/22 0201 05/18/22 0712  AST  --   --   --  26 22  ALT  --   --   --  <5 <5  ALKPHOS  --   --   --  89 77  BILITOT  --   --   --  0.5 0.8  PROT  --   --   --  6.7 6.9  ALBUMIN 3.0* 3.0* 3.0* 3.0* 2.9*    CBG: No results for input(s): GLUCAP in the last 168 hours.   No results found for this or any previous visit (from the past 240 hour(s)).        Radiology Studies: DG Abd 1 View  Result Date: 05/18/2022 CLINICAL DATA:  71 year old female with abdominal pain. EXAM: ABDOMEN - 1 VIEW COMPARISON:  CT abdomen pelvis from 01/23/2022 FINDINGS: The bowel gas pattern is normal. Extensive atherosclerotic calcifications. Surgical clips in the right upper quadrant. IMPRESSION: 1. Nonobstructive bowel gas pattern. 2.  Aortic Atherosclerosis (ICD10-I70.0). Electronically Signed   By: Marliss Coots M.D.   On: 05/18/2022 11:47        Scheduled Meds:  atorvastatin  80 mg Oral Daily   B-complex with vitamin C  1 tablet Oral Daily   And   folic acid  1 mg Oral Daily   Chlorhexidine Gluconate Cloth  6 each Topical Q0600   Chlorhexidine Gluconate Cloth  6 each Topical Q0600   cinacalcet  30 mg Oral Q M,W,F-1800   clopidogrel  75 mg Oral Daily   escitalopram  10 mg  Oral Daily   famotidine  20 mg Oral Daily   feeding supplement  237 mL Oral BID BM   heparin injection (subcutaneous)  5,000 Units Subcutaneous Q8H   liver oil-zinc oxide   Topical Q4H   midodrine  10 mg Oral TID WC   sodium chloride flush  3 mL  Intravenous Q12H   vancomycin  125 mg Oral Q12H   Continuous Infusions:   ceFAZolin (ANCEF) IV 1 g (05/17/22 1828)     LOS: 12 days    Time spent: over 30 min    Lacretia Nicks, MD [facility]   To contact the attending provider between 7A-7P or the covering provider during after hours 7P-7A, please log into the web site [email or web address] and access using universal [place] password for that web site. If you do not have the password, please call the hospital operator.  05/18/2022, 2:28 PM

## 2022-05-19 ENCOUNTER — Inpatient Hospital Stay: Payer: Medicare Other | Admitting: Internal Medicine

## 2022-05-19 DIAGNOSIS — I214 Non-ST elevation (NSTEMI) myocardial infarction: Secondary | ICD-10-CM | POA: Diagnosis not present

## 2022-05-19 LAB — TYPE AND SCREEN
ABO/RH(D): O POS
Antibody Screen: NEGATIVE
Donor AG Type: NEGATIVE
Donor AG Type: NEGATIVE
Unit division: 0
Unit division: 0

## 2022-05-19 LAB — COMPREHENSIVE METABOLIC PANEL
ALT: <5 U/L (ref 0–44)
AST: 22 U/L (ref 15–41)
Albumin: 2.8 g/dL — ABNORMAL LOW (ref 3.5–5.0)
Alkaline Phosphatase: 84 U/L (ref 38–126)
Anion gap: 16 — ABNORMAL HIGH (ref 5–15)
BUN: 11 mg/dL (ref 8–23)
CO2: 27 mmol/L (ref 22–32)
Calcium: 9.5 mg/dL (ref 8.9–10.3)
Chloride: 94 mmol/L — ABNORMAL LOW (ref 98–111)
Creatinine, Ser: 2.92 mg/dL — ABNORMAL HIGH (ref 0.44–1.00)
GFR, Estimated: 17 mL/min — ABNORMAL LOW (ref >60)
Glucose, Bld: 133 mg/dL — ABNORMAL HIGH (ref 70–99)
Potassium: 3.5 mmol/L (ref 3.5–5.1)
Sodium: 137 mmol/L (ref 135–145)
Total Bilirubin: 0.6 mg/dL (ref 0.3–1.2)
Total Protein: 6.6 g/dL (ref 6.5–8.1)

## 2022-05-19 LAB — BPAM RBC
Blood Product Expiration Date: 202306112359
Blood Product Expiration Date: 202306112359
Unit Type and Rh: 5100
Unit Type and Rh: 5100

## 2022-05-19 LAB — CBC WITH DIFFERENTIAL/PLATELET
Abs Immature Granulocytes: 0.02 10*3/uL (ref 0.00–0.07)
Basophils Absolute: 0.1 10*3/uL (ref 0.0–0.1)
Basophils Relative: 1 %
Eosinophils Absolute: 0.2 10*3/uL (ref 0.0–0.5)
Eosinophils Relative: 3 %
HCT: 29 % — ABNORMAL LOW (ref 36.0–46.0)
Hemoglobin: 9.4 g/dL — ABNORMAL LOW (ref 12.0–15.0)
Immature Granulocytes: 0 %
Lymphocytes Relative: 15 %
Lymphs Abs: 1.1 10*3/uL (ref 0.7–4.0)
MCH: 33.3 pg (ref 26.0–34.0)
MCHC: 32.4 g/dL (ref 30.0–36.0)
MCV: 102.8 fL — ABNORMAL HIGH (ref 80.0–100.0)
Monocytes Absolute: 1.3 10*3/uL — ABNORMAL HIGH (ref 0.1–1.0)
Monocytes Relative: 19 %
Neutro Abs: 4.3 10*3/uL (ref 1.7–7.7)
Neutrophils Relative %: 62 %
Platelets: 183 10*3/uL (ref 150–400)
RBC: 2.82 MIL/uL — ABNORMAL LOW (ref 3.87–5.11)
RDW: 20.2 % — ABNORMAL HIGH (ref 11.5–15.5)
WBC: 6.9 10*3/uL (ref 4.0–10.5)
nRBC: 0 % (ref 0.0–0.2)

## 2022-05-19 LAB — MAGNESIUM: Magnesium: 1.7 mg/dL (ref 1.7–2.4)

## 2022-05-19 LAB — PHOSPHORUS: Phosphorus: 1.9 mg/dL — ABNORMAL LOW (ref 2.5–4.6)

## 2022-05-19 MED ORDER — K PHOS MONO-SOD PHOS DI & MONO 155-852-130 MG PO TABS
500.0000 mg | ORAL_TABLET | Freq: Two times a day (BID) | ORAL | Status: AC
  Administered 2022-05-19 – 2022-05-20 (×4): 500 mg via ORAL
  Filled 2022-05-19 (×4): qty 2

## 2022-05-19 MED ORDER — CEFAZOLIN SODIUM-DEXTROSE 2-4 GM/100ML-% IV SOLN: 2.0000 g | INTRAVENOUS | Status: DC

## 2022-05-19 MED ORDER — CEFAZOLIN SODIUM-DEXTROSE 2-4 GM/100ML-% IV SOLN
2.0000 g | INTRAVENOUS | Status: DC
  Administered 2022-05-20 – 2022-05-22 (×2): 2 g via INTRAVENOUS
  Filled 2022-05-19 (×2): qty 100

## 2022-05-19 NOTE — Progress Notes (Signed)
PROGRESS NOTE    [name]  LKG:[id number] DOB: [birth year] DOA: 05/06/2022 PCP: Renford Dills, MD  Chief Complaint  Patient presents with   Shortness of Breath    Brief Narrative:  [name] is [name] 71 yo female with PMH dCHF, ESRD on HD, CVA, CAD, HLD, HTN, GERD who presented with shortness of breath.  She was recently hospitalized from 04/26/2022 until 05/04/2022 with septic shock due to staph bacteremia from infected AV graft.  She was discharged on Ancef to complete course followed by chronic oral suppression.  On admission she was found to be somnolent, hypoxic, and volume overloaded.  Further work-up revealed elevated troponin which further uptrended consistent with NSTEMI.  She was evaluated by cardiology and also started on [name] heparin drip on admission.  She was not considered [name] good interventional candidate.  Cardiology recommended medical management.  She had recurrent chest pain during her admission with recurrent bump in trop and cardiology again recommended medical management and palliative care.  Stay complicated by wound dehiscence adjacent to L arm graft, now s/p exploration of L arm graft and evacuation of hematoma by vascular on 6/2.   Hospital stay also complicated by hypotension, now improved.  Her diarrhea has improved.  I think she'll be ok to discharge in next 24 hours or so, once she has authorization.    Assessment & Plan:   Principal Problem:   NSTEMI (non-ST elevated myocardial infarction) (HCC) Active Problems:   Hypotension   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HCC)   C. difficile diarrhea   End-stage renal disease on hemodialysis (HCC)   Dermatitis   Acute on chronic diastolic (congestive) heart failure (HCC)   Physical deconditioning   Assessment and Plan: * NSTEMI (non-ST elevated myocardial infarction) (HCC) - SOB and CP on admission; trop elevated with peak at 1,008 - s/p 48 hr of heparin drip completed - overnight 5/31-6/1 she c/o SOB/chest  discomfort, like her chest was exploding (sx lasted about 30 min to 1 hour, now resolved).  EKG without acute ST-T wave changes, troponins elevated/rising again.  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  She's not candidate for surgical revascularization.  Currently attempting medical therapy.  Hypotension limits use of antianginal meds.  She likely has additional myocardium ischemic and at risk and will continue to have episodes of angina pectoris with "piecemeal" NSTEMI '"for weeks to come".  Recommending treatment with plavix, highest tolerated beta blocker by BP, high dose statin.  Can use sublingual nitro prn, no long acting nitrates due to BP.  Recommended consideration of palliative care c/s - will recommend palliative follow outpatient. - continue statin and BB (hold BB for BP) - per cardiology, not currently an interventional candidate due to multiple medical issues currently  -Plavix monotherapy okay per cardiology  Hypotension SBP in 80's this AM, 250 cc bolus given Continue midodrine 10 mg TID (increased dose) Getting BP from leg cuff, unclear how accurate - she's generally seemed asymptomatic Stop metoprolol.  Resolved at this time - could consider resuming metop, but had pretty impressive hypotension [name] few days ago with this.  Will hold for now.   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HCC) - hospitalized 5/14 - 5/22; AV graft infection, MSSA bacteremia; evaluated by ID. Plan for Ancef post HD MWF until 6/13 (was receiving daily here, lost IV access, will transition back to after dialysis dosing) then oral doxy 100 mg BID indefinitely (due to CDI while on Ancef) -  continue Ancef  - appreciate ID evaluation after re-admission  - Follow-up repeat blood cultures obtained on 05/06/2022: NGTD x 5 days - s/p exploration of L arm graft and evacuation of hematoma on 6/2 for wound dehiscence adjacent to L arm graft - Dr. Edilia Bo  recommending dressing changes with moist 2x2.  C. difficile diarrhea - s/p recent diarrhea; C diff Ag positive, toxin negative, PCR positive - started on oral Vanc - per ID (5/25) continue oral vancomycin 125 mg QID x 14 days (duration extended on 6/5 after discussion with ID due to increased diarrhea and more abdominal discomfort) then 125 mg BID for 7 days after completion of IV abx - discussed with ID on 6/5 due to increased diarrhea and abdominal discomfort after transition to twice daily vanc -> they recommended extending QID course of oral vanc to 14 days (as above)  End-stage renal disease on hemodialysis (HCC) - Continue chronic midodrine.  BP stable - Nephrology following, continue inpatient dialysis per nephrology - dehiscence of incision R AV graft noted 6/2  - s/p exploration of left arm graft and evacuation of hematoma 6/2  Dermatitis - skin breakdown from ongoing diarrhea - WOC consult appreciated - did not respond much to Gerhardt's cream, now on desitin trial   Acute on chronic diastolic (congestive) heart failure (HCC) - Volume removal planned with HD - CXR on admission consistent with volume overload  Physical deconditioning - Planning for discharge back to [facility] at discharge.  Will need insurance auth      DVT prophylaxis: heparin Code Status: DNR Family Communication: none at bedsdie Disposition:   Status is: Inpatient Remains inpatient appropriate because: continued diarrhea   Consultants:  ID Nephrology cardiology  Procedures:  none  Antimicrobials:  Anti-infectives (From admission, onward)    Start     Dose/Rate Route Frequency Ordered Stop   05/22/22 1000  vancomycin (VANCOCIN) capsule 125 mg       See Hyperspace for full Linked Orders Report.   125 mg Oral 2 times daily 05/18/22 1444 06/04/22 0959   05/20/22 2000  ceFAZolin (ANCEF) IVPB 2g/100 mL premix  Status:  Discontinued        2 g 200 mL/hr over 30 Minutes Intravenous Every  M-W-F (2000) 05/19/22 1742 05/19/22 1743   05/20/22 1200  ceFAZolin (ANCEF) IVPB 2g/100 mL premix        2 g 200 mL/hr over 30 Minutes Intravenous Every M-W-F (Hemodialysis) 05/19/22 1743 05/26/22 2359   05/18/22 1530  vancomycin (VANCOCIN) capsule 125 mg       See Hyperspace for full Linked Orders Report.   125 mg Oral 4 times daily 05/18/22 1444 05/22/22 0959   05/17/22 1000  vancomycin (VANCOCIN) 50 mg/mL oral solution SOLN 125 mg  Status:  Discontinued        125 mg Oral Every 12 hours 05/07/22 1314 05/15/22 1913   05/17/22 1000  vancomycin (VANCOCIN) capsule 125 mg  Status:  Discontinued        125 mg Oral Every 12 hours 05/15/22 1913 05/18/22 1444   05/15/22 1800  ceFAZolin (ANCEF) IVPB 1 g/50 mL premix  Status:  Discontinued        1 g 100 mL/hr over 30 Minutes Intravenous Every 24 hours 05/15/22 1639 05/19/22 1742   05/15/22 1345  ceFAZolin (ANCEF) IVPB 1 g/50 mL premix        1 g 100 mL/hr over 30 Minutes Intravenous  Once 05/15/22 1343 05/15/22 1405   05/15/22 1338  ceFAZolin (ANCEF) 1-4 GM/50ML-% IVPB       Note to Pharmacy: [name] E: cabinet override      05/15/22 1338 05/15/22 1423   05/08/22 1800  ceFAZolin (ANCEF) IVPB 2g/100 mL premix  Status:  Discontinued        2 g 200 mL/hr over 30 Minutes Intravenous Every M-W-F (1800) 05/07/22 1016 05/15/22 1639   05/08/22 1200  ceFAZolin (ANCEF) IVPB 1 g/50 mL premix  Status:  Discontinued        1 g 100 mL/hr over 30 Minutes Intravenous Every M-W-F (Hemodialysis) 05/07/22 0935 05/07/22 1016   05/07/22 1030  vancomycin (VANCOCIN) capsule 125 mg        125 mg Oral 4 times daily 05/07/22 0935 05/17/22 0959   05/06/22 1800  vancomycin (VANCOREADY) IVPB 500 mg/100 mL  Status:  Discontinued        500 mg 100 mL/hr over 60 Minutes Intravenous Every M-W-F (Hemodialysis) 05/06/22 1218 05/07/22 0935   05/06/22 1800  ceFEPIme (MAXIPIME) 2 g in sodium chloride 0.9 % 100 mL IVPB  Status:  Discontinued        2 g 200 mL/hr over 30  Minutes Intravenous Every M-W-F (Hemodialysis) 05/06/22 1218 05/07/22 0935   05/06/22 1145  ceFEPIme (MAXIPIME) 2 g in sodium chloride 0.9 % 100 mL IVPB        2 g 200 mL/hr over 30 Minutes Intravenous  Once 05/06/22 1143 05/06/22 1324   05/06/22 1145  vancomycin (VANCOCIN) IVPB 1000 mg/200 mL premix        1,000 mg 200 mL/hr over 60 Minutes Intravenous  Once 05/06/22 1143 05/06/22 1436       Subjective: Feeling ebtter  Objective: Vitals:   05/18/22 2116 05/19/22 0500 05/19/22 1008 05/19/22 1452  BP: 99/87  (!) 146/41 (!) 145/44  Pulse: 67  80 70  Resp: 15  16 17   Temp: 97.7 F (36.5 C)  98 F (36.7 C) 98 F (36.7 C)  TempSrc: Oral  Oral Oral  SpO2: 90%   99%  Weight:  79.8 kg    Height:        Intake/Output Summary (Last 24 hours) at 05/19/2022 1835 Last data filed at 05/19/2022 1453 Gross per 24 hour  Intake 180 ml  Output 1 ml  Net 179 ml   Filed Weights   05/18/22 0500 05/19/22 0500  Weight: 81.6 kg 79.8 kg    Examination:  General: No acute distress. Sitting up on edge of bed with therapy Lungs: unlabored Neurological: Alert and oriented 3. Moves all extremities 4. Cranial nerves II through XII grossly intact. Extremities: No clubbing or cyanosis. No edema.   Data Reviewed: I have personally reviewed following labs and imaging studies  CBC: Recent Labs  Lab 05/15/22 0653 05/16/22 0121 05/17/22 0201 05/18/22 0712 05/19/22 0101  WBC 6.1 12.6* 6.5 7.3 6.9  NEUTROABS 3.0 11.4* 4.0 4.3 4.3  HGB 9.3* 9.5* 9.5* 10.0* 9.4*  HCT 29.2* 29.9* 29.0* 29.9* 29.0*  MCV 101.0* 103.5* 101.4* 101.4* 102.8*  PLT 214 226 171 182 183    Basic Metabolic Panel: Recent Labs  Lab 05/15/22 0653 05/16/22 0121 05/17/22 0201 05/18/22 0712 05/19/22 0101  NA 135 133* 132* 135 137  K 4.5 5.5* 3.6 4.0 3.5  CL 97* 96* 93* 96* 94*  CO2 24 21* 26 24 27   GLUCOSE 88 144* 98 96 133*  BUN 21 30* 15 27* 11  CREATININE 4.75* 5.63* 3.11* 5.12* 2.92*  CALCIUM 9.4 9.6 9.3  9.8  9.5  MG 2.1 2.2 1.8 1.9 1.7  PHOS 2.8 4.9* 2.7 3.1 1.9*    GFR: Estimated Creatinine Clearance: 17.3 mL/min (Samaria Anes) (by C-G formula based on SCr of 2.92 mg/dL (H)).  Liver Function Tests: Recent Labs  Lab 05/15/22 0653 05/16/22 0121 05/17/22 0201 05/18/22 0712 05/19/22 0101  AST  --   --  26 22 22   ALT  --   --  <5 <5 <5  ALKPHOS  --   --  89 77 84  BILITOT  --   --  0.5 0.8 0.6  PROT  --   --  6.7 6.9 6.6  ALBUMIN 3.0* 3.0* 3.0* 2.9* 2.8*    CBG: No results for input(s): GLUCAP in the last 168 hours.   No results found for this or any previous visit (from the past 240 hour(s)).        Radiology Studies: DG Abd 1 View  Result Date: 05/18/2022 CLINICAL DATA:  71 year old female with abdominal pain. EXAM: ABDOMEN - 1 VIEW COMPARISON:  CT abdomen pelvis from 01/23/2022 FINDINGS: The bowel gas pattern is normal. Extensive atherosclerotic calcifications. Surgical clips in the right upper quadrant. IMPRESSION: 1. Nonobstructive bowel gas pattern. 2.  Aortic Atherosclerosis (ICD10-I70.0). Electronically Signed   By: Marliss Coots M.D.   On: 05/18/2022 11:47        Scheduled Meds:  atorvastatin  80 mg Oral Daily   B-complex with vitamin C  1 tablet Oral Daily   And   folic acid  1 mg Oral Daily   Chlorhexidine Gluconate Cloth  6 each Topical Q0600   Chlorhexidine Gluconate Cloth  6 each Topical Q0600   clopidogrel  75 mg Oral Daily   escitalopram  10 mg Oral Daily   famotidine  20 mg Oral Daily   feeding supplement  237 mL Oral BID BM   heparin injection (subcutaneous)  5,000 Units Subcutaneous Q8H   liver oil-zinc oxide   Topical Q4H   midodrine  10 mg Oral TID WC   phosphorus  500 mg Oral BID   sodium chloride flush  3 mL Intravenous Q12H   vancomycin  125 mg Oral QID   Followed by   Melene Muller ON 05/22/2022] vancomycin  125 mg Oral BID   Continuous Infusions:  [START ON 05/20/2022]  ceFAZolin (ANCEF) IV       LOS: 13 days    Time spent: over 30 min    Lacretia Nicks, MD [facility]   To contact the attending provider between 7A-7P or the covering provider during after hours 7P-7A, please log into the web site [email or web address] and access using universal [place] password for that web site. If you do not have the password, please call the hospital operator.  05/19/2022, 6:35 PM

## 2022-05-19 NOTE — Progress Notes (Addendum)
  Progress Note    05/19/2022 7:46 AM 4 Days Post-Op  Subjective:  No complaints this morning   Vitals:   05/18/22 1831 05/18/22 2116  BP: (!) 115/37 99/87  Pulse: 66 67  Resp: 15 15  Temp:  97.7 F (36.5 C)  SpO2: 100% 90%   Physical Exam: Lungs:  non labored Incisions:  R arm wound bed healthy appearing, no purulence Neurologic: A&O  CBC    Component Value Date/Time   WBC 6.9 05/19/2022 0101   RBC 2.82 (L) 05/19/2022 0101   HGB 9.4 (L) 05/19/2022 0101   HGB 10.6 (L) 07/13/2018 0814   HGB 8.7 (L) 09/24/2011 1038   HCT 29.0 (L) 05/19/2022 0101   HCT 33.0 (L) 07/13/2018 0814   HCT 27.3 (L) 09/24/2011 1038   PLT 183 05/19/2022 0101   PLT 203 07/13/2018 0814   MCV 102.8 (H) 05/19/2022 0101   MCV 91 07/13/2018 0814   MCV 94.5 09/24/2011 1038   MCH 33.3 05/19/2022 0101   MCHC 32.4 05/19/2022 0101   RDW 20.2 (H) 05/19/2022 0101   RDW 16.9 (H) 07/13/2018 0814   RDW 20.1 (H) 09/24/2011 1038   LYMPHSABS 1.1 05/19/2022 0101   LYMPHSABS 1.1 09/24/2011 1038   MONOABS 1.3 (H) 05/19/2022 0101   MONOABS 0.6 09/24/2011 1038   EOSABS 0.2 05/19/2022 0101   EOSABS 0.1 09/24/2011 1038   BASOSABS 0.1 05/19/2022 0101   BASOSABS 0.0 09/24/2011 1038    BMET    Component Value Date/Time   NA 137 05/19/2022 0101   NA 140 07/13/2018 0814   K 3.5 05/19/2022 0101   CL 94 (L) 05/19/2022 0101   CO2 27 05/19/2022 0101   GLUCOSE 133 (H) 05/19/2022 0101   BUN 11 05/19/2022 0101   BUN 45 (H) 07/13/2018 0814   CREATININE 2.92 (H) 05/19/2022 0101   CALCIUM 9.5 05/19/2022 0101   CALCIUM 9.6 08/03/2011 1005   GFRNONAA 17 (L) 05/19/2022 0101   GFRAA 7 (L) 07/26/2020 0920    INR    Component Value Date/Time   INR 1.2 05/06/2022 1000     Intake/Output Summary (Last 24 hours) at 05/19/2022 0746 Last data filed at 05/18/2022 0818 Gross per 24 hour  Intake 10 ml  Output --  Net 10 ml     Assessment/Plan:  71 y.o. female is s/p wound dehiscence R arm 4 Days Post-Op   Healthy  appearing wound bed of R arm wound; continue current wound care Ok for discharge from vascular standpoint when [facility] arranged for wound care    Dagoberto Ligas, PA-C [facility] [phone] 05/19/2022 7:46 AM  I have interviewed the patient and examined the patient. I agree with the findings by the PA. Continue meticulous wound care.   Gae Gallop, MD

## 2022-05-19 NOTE — Progress Notes (Signed)
Occupational Therapy Treatment Patient Details Name: [name] MRN: [id number] DOB: May 17, 1951 Today's Date: 05/19/2022   History of present illness Pt is a 71 y.o. female admitted 05/06/22 with SOB and infection. 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 S/p RUE AVG excision with revision 5/15. Course complicated by hypotension perioperatively; ETT 5/15-5/16. Workup for septic shock secondary to MRSA bacteremia. 6/2 pt s/p exploration of LUE graft. PMH includes ESRD (HD MWF), CKD, CHF, HTN, HLD.   OT comments  Pt presented in bed reporting feeling very fatigued but willing to attempt to complete as much as they could at this time. Pt was able to complete oral care and hair care while sitting at EOB post set up and min-mod assist to be able to remain at midline while completion of activity. Pt then completed supine to sitting with min guard and rolling side to side with supervision to min guard for hygiene as had loose BM.  Pt currently with functional limitations due to the deficits listed below (see OT Problem List).  Pt will benefit from skilled OT to increase their safety and independence with ADL and functional mobility for ADL to facilitate discharge to venue listed below.     Recommendations for follow up therapy are one component of a multi-disciplinary discharge planning process, led by the attending physician.  Recommendations may be updated based on patient status, additional functional criteria and insurance authorization.    Follow Up Recommendations  Skilled nursing-short term rehab (<3 hours/day)    Assistance Recommended at Discharge Frequent or constant Supervision/Assistance  Patient can return home with the following  Two people to help with walking and/or transfers;A lot of help with bathing/dressing/bathroom;Assistance with cooking/housework;Direct supervision/assist for medications management;Direct  supervision/assist for financial management;Assist for transportation;Help with stairs or ramp for entrance;Assistance with feeding   Equipment Recommendations  Other (comment) (TBD)    Recommendations for Other Services      Precautions / Restrictions Precautions Precautions: Fall Precaution Comments: bp Restrictions Weight Bearing Restrictions: No       Mobility Bed Mobility Overal bed mobility: Needs Assistance Bed Mobility: Rolling, Supine to Sit, Sit to Supine Rolling: Min guard   Supine to sit: Min assist, HOB elevated Sit to supine: Min guard        Transfers                   General transfer comment: Pt at this time fatigued following EOB activity     Balance Overall balance assessment: Needs assistance Sitting-balance support: Feet supported, Bilateral upper extremity supported, Single extremity supported Sitting balance-Leahy Scale: Poor Sitting balance - Comments: Pt lateral lean while completion of ADLS while sitting at bed side required min-mod assit Postural control: Right lateral lean, Left lateral lean, Posterior lean                                 ADL either performed or assessed with clinical judgement   ADL Overall ADL's : Needs assistance/impaired Eating/Feeding: Minimal assistance;Moderate assistance;Bed level   Grooming: Oral care;Minimal assistance;Cueing for safety;Cueing for sequencing;Sitting;Brushing hair                                      Extremity/Trunk Assessment Upper Extremity Assessment Upper Extremity Assessment: LUE deficits/detail LUE Deficits / Details: decrease in  AROM in gravity plane and noted decrease in use but at bed level agreed to attempt to use LUE Coordination: decreased gross motor   Lower Extremity Assessment Lower Extremity Assessment: Defer to PT evaluation        Vision       Perception     Praxis      Cognition Arousal/Alertness: Awake/alert Behavior  During Therapy: WFL for tasks assessed/performed Overall Cognitive Status: No family/caregiver present to determine baseline cognitive functioning                                 General Comments: Pt noted to have decrease in orientation to time        Exercises      Shoulder Instructions       General Comments      Pertinent Vitals/ Pain       Pain Assessment Pain Assessment: Faces Faces Pain Scale: Hurts a little bit Pain Location: bottom Pain Descriptors / Indicators: Discomfort Pain Intervention(s): Limited activity within patient's tolerance, Monitored during session, Repositioned  Home Living                                          Prior Functioning/Environment              Frequency  Min 2X/week        Progress Toward Goals  OT Goals(current goals can now be found in the care plan section)  Progress towards OT goals: Progressing toward goals  Acute Rehab OT Goals Patient Stated Goal: to be able to keep working OT Goal Formulation: Patient unable to participate in goal setting Time For Goal Achievement: 06/02/22 Potential to Achieve Goals: Fair ADL Goals Pt Will Perform Grooming: with supervision;sitting Pt Will Perform Lower Body Bathing: with min assist;sitting/lateral leans;with adaptive equipment Pt Will Perform Upper Body Dressing: sitting;with supervision Pt Will Perform Lower Body Dressing: with min assist;with adaptive equipment;sitting/lateral leans Pt Will Transfer to Toilet: with mod assist;stand pivot transfer;bedside commode Pt Will Perform Toileting - Clothing Manipulation and hygiene: with mod assist;sit to/from stand;sitting/lateral leans Additional ADL Goal #1: Pt will be able to sit EOB for 10-15 mins to increase in sitting tolerance for ADLS Additional ADL Goal #2: Patient will demonstrate increased alert affect to follow commands consistently in OT session without cues for reorientation.  Plan  Discharge plan remains appropriate    Co-evaluation                 AM-PAC OT "6 Clicks" Daily Activity     Outcome Measure   Help from another person eating meals?: A Little Help from another person taking care of personal grooming?: A Little Help from another person toileting, which includes using toliet, bedpan, or urinal?: Total Help from another person bathing (including washing, rinsing, drying)?: A Lot Help from another person to put on and taking off regular upper body clothing?: A Lot Help from another person to put on and taking off regular lower body clothing?: Total 6 Click Score: 12    End of Session Equipment Utilized During Treatment: Gait belt  OT Visit Diagnosis: Other abnormalities of gait and mobility (R26.89);Muscle weakness (generalized) (M62.81);Other symptoms and signs involving cognitive function   Activity Tolerance Patient limited by fatigue   Patient Left in bed;with call bell/phone within reach  Nurse Communication          Time: 3009-2330 OT Time Calculation (min): 31 min  Charges: OT General Charges $OT Visit: 1 Visit OT Treatments $Self Care/Home Management : 23-37 mins  Joeseph Amor OTR/L  Acute Rehab Services  [phone] office number [phone] pager number   Joeseph Amor 05/19/2022, 12:45 PM

## 2022-05-19 NOTE — Progress Notes (Addendum)
Subjective: Seen in room, no complaints, no shortness of breath or chest pain she thinks her diarrhea is resolving, said tolerated dialysis yesterday no UF  Objective Vital signs in last 24 hours: Vitals:   05/18/22 1802 05/18/22 1831 05/18/22 2116 05/19/22 0500  BP: (!) 111/97 (!) 115/37 99/87   Pulse: 67 66 67   Resp: '14 15 15   '$ Temp:   97.7 F (36.5 C)   TempSrc:   Oral   SpO2: 96% 100% 90%   Weight:    79.8 kg  Height:       Weight change: -1.814 kg  Physical Exam: General: Alert elderly female chronically ill pleasant, NAD Heart: RRR no MRG Lungs: CTA, unlabored breathing Abdomen: NABS, soft NTND Extremities: No pedal edema Dialysis Access: RUA AVGG + bruit, with dry clean dressing.  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  Now appearing euvolemic , 6/05 yesterday a.m. low blood pressure requiring IV fluid , no UF HD yesterday 6/05 /midodrine 10 mg 3 times daily started over weekend.  Noted last week was having copious diarrhea with procedure for recurrent rectal tube now resolved, may be playing catch up with loss of volume./Versus cardiac issue ESRD = HD MWF on schedule, K3.5 this a.m.  C Diff + - per PMD /earlier in admit required rectal tube because of copious diarrhea/diarrhea resolving per patient.  Plan for admit NSTEMI -  cardiology has evaluated= not interventional candidate due to multiple medical issues/Plavix/med rx.  This a.m. no chest pain or shortness of breath Recent admit MSSA sepsis/ AVG infection -AVG still functional.  Noted healing per Dr. Scot Dock 05/18/22. plan continue IV cefazolin until 6/13 with plans for oral Doxy 100 mg twice daily (due to CDI while on Ancef )thereafter for  suppression/ ID reeval noted per admit BP/ volume -see #1 , as OP also  had problems with low blood pressures using midodrine 10 mg daily.   BED  wts Questionable   Anemia esrd -Hgb 9.4 this a.m. received Aranesp 40 on 6/03  BMD ckd - C Ca high holding vitamin D. phos low at 1.9  will replace with supplement ,,not on binder /hold Sensipar with low phosphorus and (review op PTH recent=96<186<726) follow-up lab trend Nutrition 2.8 ALB will change to regular diet with fluid restrictions with her low phosphorus and low potassium continue fluid restrictions , on protein supplement Dementia/debility= at baseline and at [facility] for SNF  Ernest Haber, PA-C [place] [phone] 05/19/2022,8:31 AM  LOS: 13 days   Labs: Basic Metabolic Panel: Recent Labs  Lab 05/17/22 0201 05/18/22 0712 05/19/22 0101  NA 132* 135 137  K 3.6 4.0 3.5  CL 93* 96* 94*  CO2 '26 24 27  '$ GLUCOSE 98 96 133*  BUN 15 27* 11  CREATININE 3.11* 5.12* 2.92*  CALCIUM 9.3 9.8 9.5  PHOS 2.7 3.1 1.9*   Liver Function Tests: Recent Labs  Lab 05/17/22 0201 05/18/22 0712 05/19/22 0101  AST '26 22 22  '$ ALT <5 <5 <5  ALKPHOS 89 77 84  BILITOT 0.5 0.8 0.6  PROT 6.7 6.9 6.6  ALBUMIN  3.0* 2.9* 2.8*   No results for input(s): LIPASE, AMYLASE in the last 168 hours. No results for input(s): AMMONIA in the last 168 hours. CBC: Recent Labs  Lab 05/15/22 0653 05/16/22 0121 05/17/22 0201 05/18/22 0712 05/19/22 0101  WBC 6.1 12.6* 6.5 7.3 6.9  NEUTROABS 3.0 11.4* 4.0 4.3 4.3  HGB 9.3* 9.5* 9.5* 10.0* 9.4*  HCT 29.2* 29.9* 29.0* 29.9* 29.0*  MCV 101.0* 103.5* 101.4* 101.4* 102.8*  PLT 214 226 171 182 183   Cardiac Enzymes: No results for input(s): CKTOTAL, CKMB, CKMBINDEX, TROPONINI in the last 168 hours. CBG: No results for input(s): GLUCAP in the last 168 hours.  Studies/Results: DG Abd 1 View  Result Date: 05/18/2022 CLINICAL DATA:  71 year old female with abdominal pain. EXAM: ABDOMEN -  1 VIEW COMPARISON:  CT abdomen pelvis from 01/23/2022 FINDINGS: The bowel gas pattern is normal. Extensive atherosclerotic calcifications. Surgical clips in the right upper quadrant. IMPRESSION: 1. Nonobstructive bowel gas pattern. 2.  Aortic Atherosclerosis (ICD10-I70.0). Electronically Signed   By: Ruthann Cancer M.D.   On: 05/18/2022 11:47   Medications:   ceFAZolin (ANCEF) IV 1 g (05/17/22 1828)    atorvastatin  80 mg Oral Daily   B-complex with vitamin C  1 tablet Oral Daily   And   folic acid  1 mg Oral Daily   Chlorhexidine Gluconate Cloth  6 each Topical Q0600   Chlorhexidine Gluconate Cloth  6 each Topical Q0600   clopidogrel  75 mg Oral Daily   escitalopram  10 mg Oral Daily   famotidine  20 mg Oral Daily   feeding supplement  237 mL Oral BID BM   heparin injection (subcutaneous)  5,000 Units Subcutaneous Q8H   liver oil-zinc oxide   Topical Q4H   midodrine  10 mg Oral TID WC   sodium chloride flush  3 mL Intravenous Q12H   vancomycin  125 mg Oral QID   Followed by   Derrill Memo ON 05/22/2022] vancomycin  125 mg Oral BID

## 2022-05-19 NOTE — Progress Notes (Signed)
ID brief note    Patient has outpatient clinic id follow up today but currently still in hospital with nstemi/diarrhea cdiff    49 YF with ESRD on iHD RUE AVF with recent admission 5/14-18 for MSSA bacteremia 2/2 RUE graft infection SP revision of R AVG and excision of RAVG on 5/15 Cx+ MSSA discharge on cefazolin x 4 weeks followed by PO suppression. Admitted 5/24 with [facility] and diarrhea.   #Hx of MSSA bacteremia #RAVG revision on 5/15 # C diff diarrhea #Leukocytosis -Using RAVG for IHD. LUE acess is non functional. Currently on vancomycin  and cefepime. -Cdiff returned  + Ag and PCR. Would attribute leukocytosis to cdiff infection->started PO vancomycin.      Recommendations:  -continue cefazolin(EOT 6/13) until 6/13 -on 6/13 transition to doxycycline 100 mg po bid indefinitely -finish treatment dose 10 days Vancomycin '125mg'$  PO qid then transition to '125mg'$  PO bid until 7 days following completion of IV antibiotics -rescheduled id clinic appointment to 7/7 @ 230pm

## 2022-05-20 ENCOUNTER — Inpatient Hospital Stay (HOSPITAL_COMMUNITY): Payer: Medicare Other

## 2022-05-20 DIAGNOSIS — R5381 Other malaise: Secondary | ICD-10-CM

## 2022-05-20 DIAGNOSIS — I959 Hypotension, unspecified: Secondary | ICD-10-CM

## 2022-05-20 DIAGNOSIS — I5033 Acute on chronic diastolic (congestive) heart failure: Secondary | ICD-10-CM | POA: Diagnosis not present

## 2022-05-20 DIAGNOSIS — I214 Non-ST elevation (NSTEMI) myocardial infarction: Secondary | ICD-10-CM | POA: Diagnosis not present

## 2022-05-20 DIAGNOSIS — E440 Moderate protein-calorie malnutrition: Secondary | ICD-10-CM

## 2022-05-20 DIAGNOSIS — B9561 Methicillin susceptible Staphylococcus aureus infection as the cause of diseases classified elsewhere: Secondary | ICD-10-CM

## 2022-05-20 DIAGNOSIS — R7881 Bacteremia: Secondary | ICD-10-CM

## 2022-05-20 DIAGNOSIS — A0472 Enterocolitis due to Clostridium difficile, not specified as recurrent: Secondary | ICD-10-CM | POA: Diagnosis not present

## 2022-05-20 LAB — COMPREHENSIVE METABOLIC PANEL
ALT: <5 U/L (ref 0–44)
AST: 19 U/L (ref 15–41)
Albumin: 2.9 g/dL — ABNORMAL LOW (ref 3.5–5.0)
Alkaline Phosphatase: 93 U/L (ref 38–126)
Anion gap: 12 (ref 5–15)
BUN: 23 mg/dL (ref 8–23)
CO2: 27 mmol/L (ref 22–32)
Calcium: 9.4 mg/dL (ref 8.9–10.3)
Chloride: 100 mmol/L (ref 98–111)
Creatinine, Ser: 4.45 mg/dL — ABNORMAL HIGH (ref 0.44–1.00)
GFR, Estimated: 10 mL/min — ABNORMAL LOW (ref >60)
Glucose, Bld: 124 mg/dL — ABNORMAL HIGH (ref 70–99)
Potassium: 3.9 mmol/L (ref 3.5–5.1)
Sodium: 139 mmol/L (ref 135–145)
Total Bilirubin: 0.6 mg/dL (ref 0.3–1.2)
Total Protein: 6.8 g/dL (ref 6.5–8.1)

## 2022-05-20 LAB — CBC WITH DIFFERENTIAL/PLATELET
Abs Immature Granulocytes: 0.05 10*3/uL (ref 0.00–0.07)
Basophils Absolute: 0.1 10*3/uL (ref 0.0–0.1)
Basophils Relative: 1 %
Eosinophils Absolute: 0.2 10*3/uL (ref 0.0–0.5)
Eosinophils Relative: 3 %
HCT: 28.9 % — ABNORMAL LOW (ref 36.0–46.0)
Hemoglobin: 9.4 g/dL — ABNORMAL LOW (ref 12.0–15.0)
Immature Granulocytes: 1 %
Lymphocytes Relative: 19 %
Lymphs Abs: 1.6 10*3/uL (ref 0.7–4.0)
MCH: 33.5 pg (ref 26.0–34.0)
MCHC: 32.5 g/dL (ref 30.0–36.0)
MCV: 102.8 fL — ABNORMAL HIGH (ref 80.0–100.0)
Monocytes Absolute: 1.6 10*3/uL — ABNORMAL HIGH (ref 0.1–1.0)
Monocytes Relative: 19 %
Neutro Abs: 5 10*3/uL (ref 1.7–7.7)
Neutrophils Relative %: 57 %
Platelets: 244 10*3/uL (ref 150–400)
RBC: 2.81 MIL/uL — ABNORMAL LOW (ref 3.87–5.11)
RDW: 20.8 % — ABNORMAL HIGH (ref 11.5–15.5)
WBC: 8.5 10*3/uL (ref 4.0–10.5)
nRBC: 0 % (ref 0.0–0.2)

## 2022-05-20 LAB — MAGNESIUM: Magnesium: 1.8 mg/dL (ref 1.7–2.4)

## 2022-05-20 LAB — PHOSPHORUS: Phosphorus: 1.6 mg/dL — ABNORMAL LOW (ref 2.5–4.6)

## 2022-05-20 MED ORDER — MIDODRINE HCL 5 MG PO TABS
ORAL_TABLET | ORAL | Status: AC
  Filled 2022-05-20: qty 2

## 2022-05-20 NOTE — Progress Notes (Signed)
Consult to IV  team about patient [name] done.Per  IV team patient will need a central line,James Olena Heckle informed.

## 2022-05-20 NOTE — Assessment & Plan Note (Addendum)
Nutrition Status: Body mass index is 18.15 kg/m. Multivitamin and Nepro Could benefit from dietitian follow-up at SNF.

## 2022-05-20 NOTE — Progress Notes (Signed)
    OVERNIGHT PROGRESS REPORT  Possible unwitnessed fall.(Remote coverage notified by staff)  Notified by RN for patient found on the floor at 2230,stated she was trying to go to the bathroom.  Patient stated she hit her head to the side table,her vital  signs 97.4 71,20,129/32, left leg,100 RA. No obvious or stated injuries.   Due to possible unwitnessed fall and stating patient (may have) hit her head coupled with blood thinner use, CT is completed.   ==================================== Imaging:  "FINDINGS: Brain: No acute territorial infarction, hemorrhage or intracranial mass. Advanced atrophy. Advanced chronic small vessel ischemic changes of the white matter. Stable ventricle size   Vascular: No hyperdense vessels. Vertebral and carotid vascular calcification.   Skull: Normal. Negative for fracture or focal lesion.   Sinuses/Orbits: No acute finding.   Other: Increased atlantal axial interval up to 7 mm but appears chronic as compared with November CT. This could be secondary to inflammatory arthropathy given inflammatory changes at the tip of the dens.   IMPRESSION: 1. No CT evidence for acute intracranial abnormality. 2. Atrophy and chronic small vessel ischemic changes of the white matter     Electronically Signed   By: Donavan Foil M.D.   On: 05/20/2022 01:40"  --------------------------------------------------------  No further at this time.  Gershon Cull MSNA MSN ACNPC-AG Acute Care Nurse Practitioner [place]

## 2022-05-20 NOTE — TOC Progression Note (Signed)
Transition of Care [facility]) - Progression Note    Patient Details  Name: [name] MRN: [id number] Date of Birth: 07/01/1951  [facility] of [facility] CM/SW [place], [place] Phone Number: 05/20/2022, 11:25 AM  Clinical Narrative:    CSW was advised by MD that pt is medically optimized and insurance auth can be started. Pt is not managed by Everlene Balls, requested [employer] start auth. Insurance will need updated PT/OT notes, PT signed on to see pt, currently in HD. TOC will continue to follow for DC planning.   Expected Discharge Plan: [place] Barriers to Discharge: Continued Medical Work up, Ship broker  Expected Discharge Plan and Services Expected Discharge Plan: [place]       Living arrangements for the past 2 months: [place] Determinants of Health (SDOH) Interventions    Readmission Risk Interventions    11/11/2021    3:49 PM 08/18/2021    3:01 PM  Readmission Risk Prevention Plan  Transportation Screening Complete Complete  Medication Review (RN Care Manager) Complete Complete  PCP or Specialist appointment within 3-5 days of discharge Complete Complete  HRI or Home Care Consult Complete Complete  SW Recovery Care/Counseling Consult Complete Complete  Palliative Care Screening Not Applicable Not Applicable  Skilled Nursing Facility Complete Complete

## 2022-05-20 NOTE — Progress Notes (Signed)
Subjective: Seen in room, noted events last night= fall with negative CT scan of the head/patient trying to go to bathroom independently.  For dialysis today.  Currently no complaints eating breakfast and tolerating .reports solid stool now  Objective Vital signs in last 24 hours: Vitals:   05/19/22 2300 05/20/22 0300 05/20/22 0500 05/20/22 0754  BP: (!) 81/30 (!) 119/53  (!) 98/59  Pulse:  82  82  Resp:  17  16  Temp:  97.7 F (36.5 C)  98.1 F (36.7 C)  TempSrc:  Oral  Oral  SpO2:  97%  100%  Weight:   80.7 kg   Height:       Weight change: 0.907 kg  Physical Exam: General: Alert elderly female chronically ill pleasant, NAD Heart: RRR no MRG Lungs: CTA, unlabored breathing Abdomen: NABS, soft NTND Extremities: No pedal edema Dialysis Access: RUA AVGG + bruit, with dry clean dressing.  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 Now appearing euvolemic , 6/05 AM low blood pressure requiring IV fluid , no UF HD  6/05 /midodrine 10 mg TID started over weekend.  Prior week copious diarrhea requiring  rectal tube now resolved, may be playing catch up with loss of volume./Versus cardiac issue ESRD = HD MWF on schedule, K3.9 this a.m.  C Diff + - per PMD /earlier in admit required rectal tube/diarrhea resolved per patient.  Plans for admit NSTEMI -  cardiology has evaluated= not interventional candidate due to multiple medical issues/Plavix/med rx.  This a.m. no chest pain or shortness of breath Recent admit MSSA sepsis/ AVG infection -AVG still functional.  Noted healing per Dr. Scot Dock //ID NOTES 6/06 = continue IV cefazolin until 6/13 with plans for oral Doxy 100 mg twice daily indefinitely BP/ volume -see #1 , as OP also  had  problems with low blood pressures using midodrine 10 mg tid   BED  wts Questionable   Anemia esrd -Hgb 9.4 this a.m. received Aranesp 40 on 6/03  BMD ckd - C Ca high holding vitamin D. phos low at 1.6<1.9  replacing  with supplement ,,not on binder /hold Sensipar with low phosphorus (review op PTH recent=96<186<726) follow-up lab trend Nutrition 2.9 ALB /Diet changed to reg.diet with fluid restrictions with her low phosphorus and low potassium  , on protein supplement Dementia/debility= at baseline and at [facility] for SNF   Ernest Haber, PA-C [place] [phone] 05/20/2022,8:50 AM  LOS: 14 days   Labs: Basic Metabolic Panel: Recent Labs  Lab 05/18/22 0712 05/19/22 0101 05/20/22 0307  NA 135 137 139  K 4.0 3.5 3.9  CL 96* 94* 100  CO2 '24 27 27  '$ GLUCOSE 96 133* 124*  BUN 27* 11 23  CREATININE 5.12* 2.92* 4.45*  CALCIUM 9.8 9.5 9.4  PHOS 3.1 1.9* 1.6*   Liver Function Tests: Recent Labs  Lab 05/18/22 0712 05/19/22 0101 05/20/22 0307  AST '22 22 19  '$ ALT <5 <5 <5  ALKPHOS 77 84 93  BILITOT 0.8 0.6 0.6  PROT 6.9 6.6 6.8  ALBUMIN 2.9* 2.8* 2.9*   No results for input(s): LIPASE, AMYLASE  in the last 168 hours. No results for input(s): AMMONIA in the last 168 hours. CBC: Recent Labs  Lab 05/16/22 0121 05/17/22 0201 05/18/22 0712 05/19/22 0101 05/20/22 0307  WBC 12.6* 6.5 7.3 6.9 8.5  NEUTROABS 11.4* 4.0 4.3 4.3 5.0  HGB 9.5* 9.5* 10.0* 9.4* 9.4*  HCT 29.9* 29.0* 29.9* 29.0* 28.9*  MCV 103.5* 101.4* 101.4* 102.8* 102.8*  PLT 226 171 182 183 244   Cardiac Enzymes: No results for input(s): CKTOTAL, CKMB, CKMBINDEX, TROPONINI in the last 168 hours. CBG: No results for input(s): GLUCAP in the last 168 hours.  Medications:   ceFAZolin (ANCEF) IV      atorvastatin  80 mg Oral Daily   B-complex with vitamin C  1 tablet Oral Daily   And   folic acid  1 mg Oral Daily   Chlorhexidine Gluconate Cloth  6 each Topical Q0600   clopidogrel  75  mg Oral Daily   escitalopram  10 mg Oral Daily   famotidine  20 mg Oral Daily   feeding supplement  237 mL Oral BID BM   heparin injection (subcutaneous)  5,000 Units Subcutaneous Q8H   liver oil-zinc oxide   Topical Q4H   midodrine  10 mg Oral TID WC   phosphorus  500 mg Oral BID   sodium chloride flush  3 mL Intravenous Q12H   vancomycin  125 mg Oral QID   Followed by   Derrill Memo ON 05/22/2022] vancomycin  125 mg Oral BID

## 2022-05-20 NOTE — Procedures (Signed)
Patient seen on Hemodialysis. BP (!) 112/44   Pulse 86   Temp 98.1 F (36.7 C) (Oral)   Resp 16   Ht '5\' 2"'$  (1.575 m)   Wt 45 kg   SpO2 100%   BMI 18.15 kg/m   QB 400, UF goal 0.6 (keeping even) Tolerating treatment without complaints at this time.   Elmarie Shiley MD [facility]. Office # [phone] Pager # [phone] 9:54 AM

## 2022-05-20 NOTE — Progress Notes (Signed)
PT Cancellation Note  Patient Details Name: [name] MRN: [id number] DOB: 11-Jun-1951   Cancelled Treatment:    Reason Eval/Treat Not Completed: Patient at procedure or test/unavailable, pt off unit at HD, Will check back as schedule allows to continue with PT POC.    Betsey Holiday Haeli Gerlich 05/20/2022, 1:47 PM

## 2022-05-20 NOTE — Progress Notes (Signed)
Patient was observed on the floor  in her room at 2230 on 6/6 2023, with no injury noted,patient stated 'I was trying to go to the bathroom to have a large bowel movement,patient stated 'I hit my head to the side table,patient assessed, alert and oriented x 4,vital signs obtained,neuro checks, she was able to move her upper and lower extremities, denies pain,patient was assisted to the chair,bathed and transfer to bed, Gershon Cull, was notified at 2254 and order for CT scan,Family  [place] Mole informed at 2330.

## 2022-05-20 NOTE — Progress Notes (Signed)
Received consult for IV placement. Per RN, pt has BUE restriction. Due to this and renal history, recommend considering central line. Secure chat sent to RN Alexandra A.

## 2022-05-20 NOTE — Progress Notes (Signed)
PROGRESS NOTE  [name] NFA:[id number] DOB: Oct 25, 1951   PCP: Renford Dills, MD  Patient is from: SNF  DOA: 05/06/2022 LOS: 14  Chief complaints Chief Complaint  Patient presents with   Shortness of Breath     Brief Narrative / Interim history: [name] is a 71 yo female with PMH dCHF, ESRD on HD, CVA, CAD, HLD, HTN, GERD  and recent hospitalization from 5/14-5/22 with septic shock due to staph bacteremia from infected AV graft for which she was discharged on IV Ancef returning with shortness of breath and admitted with non-STEMI and fluid overload.  She was started on IV heparin.  Cardiology consulted.  She was deemed to be not a good interventional candidate.  Cardiology recommended medical management and palliative care consult.   Hospital stay stay complicated by wound dehiscence adjacent to L arm graft, now s/p exploration of L arm graft and evacuation of hematoma by vascular on 6/2.  She is also diagnosed with C. difficile for which she is on vancomycin.  She is more or less stabilized for discharge to SNF as of 05/20/2022.   Subjective: Seen and examined earlier this morning while she was on dialysis.  She was found down on the floor last night.  It seems she was trying to go to the bathroom.  She reported hitting her head against the side of the table.  No LOC or focal neuro symptoms.  CT head was negative.  She states she had a bowel accident earlier this morning while on dialysis.  Otherwise, no complaints.  She denies pain.   Objective: Vitals:   05/20/22 1339 05/20/22 1342 05/20/22 1433 05/20/22 1623  BP: (!) 93/38   (!) 111/34  Pulse:  77  82  Resp: 16 19 19 18   Temp:  (!) 97.4 F (36.3 C)  (!) 97.5 F (36.4 C)  TempSrc:  Oral  Oral  SpO2: 100% 92%  94%  Weight:  45 kg    Height:        Examination:  GENERAL: Appears frail. HEENT: MMM.  Vision and hearing grossly intact.  NECK: Supple.  No apparent JVD.  RESP:  No IWOB.  Fair aeration  bilaterally. CVS:  RRR. Heart sounds normal.  ABD/GI/GU: BS+. Abd soft, NTND.  MSK/EXT:  Moves extremities. No apparent deformity. No edema.  SKIN: no apparent skin lesion or wound NEURO: Awake, alert and oriented appropriately.  No apparent focal neuro deficit. PSYCH: Calm. Normal affect.   Procedures:  6/2-left arm graft exploration and evacuation of hematoma  Microbiology summarized: 5/25-C. difficile positive. 5/24-blood cultures NGTD.  Assessment and Plan: * NSTEMI Deemed to be not a good interventional candidate by cardiology Cardiology recommended medical management and palliative care consult. Patient is DNR/DNI.  May benefit from palliative follow-up outpatient She is currently chest pain-free.  Acute on chronic diastolic CHF No cardiopulmonary symptoms this morning.  Fluid management with dialysis  Vascular graft infection  and MSSA bacteremia Plan for Ancef post HD MWF until 6/13, then oral doxy 100 mg BID indefinitely  S/p exploration of L arm graft and evacuation of hematoma on 6/2 for wound dehiscence   Hypotension Remains hypotensive but improved. -Continue midodrine   C. difficile diarrhea Per discussion between previous attending and ID, plan is p.o. vancomycin 4 times daily for 14 days followed by twice daily given ongoing diarrhea.  End-stage renal disease on hemodialysis [facility]) Per nephrology  Dermatitis - skin breakdown from ongoing diarrhea - WOC consult appreciated - did not  respond much to Gerhardt's cream, now on desitin trial   Physical deconditioning Continue therapy  Malnutrition of moderate degree Nutrition Status: Body mass index is 18.15 kg/m.       Consult dietitian    DVT prophylaxis:  heparin injection 5,000 Units Start: 05/09/22 0600  Code Status: DNR/DNI Family Communication: Patient and/or RN. Available if any question.  Level of care: Telemetry Medical Status is: Inpatient Remains inpatient appropriate because: Safe  disposition/SNF   Final disposition: SNF Consultants:  Plastic surgery Nephrology Palliative medicine  Sch Meds:  Scheduled Meds:  atorvastatin  80 mg Oral Daily   B-complex with vitamin C  1 tablet Oral Daily   And   folic acid  1 mg Oral Daily   Chlorhexidine Gluconate Cloth  6 each Topical Q0600   clopidogrel  75 mg Oral Daily   escitalopram  10 mg Oral Daily   famotidine  20 mg Oral Daily   feeding supplement  237 mL Oral BID BM   heparin injection (subcutaneous)  5,000 Units Subcutaneous Q8H   midodrine       midodrine  10 mg Oral TID WC   phosphorus  500 mg Oral BID   sodium chloride flush  3 mL Intravenous Q12H   vancomycin  125 mg Oral QID   Followed by   Melene Muller ON 05/22/2022] vancomycin  125 mg Oral BID   Continuous Infusions:   ceFAZolin (ANCEF) IV Stopped (05/20/22 1602)   PRN Meds:.acetaminophen **OR** acetaminophen, albuterol, camphor-menthol **AND** hydrOXYzine, dextromethorphan, docusate sodium, feeding supplement (NEPRO CARB STEADY), ondansetron **OR** ondansetron (ZOFRAN) IV, sorbitol, zolpidem  Antimicrobials: Anti-infectives (From admission, onward)    Start     Dose/Rate Route Frequency Ordered Stop   05/22/22 1000  vancomycin (VANCOCIN) capsule 125 mg       See Hyperspace for full Linked Orders Report.   125 mg Oral 2 times daily 05/18/22 1444 06/04/22 0959   05/20/22 2000  ceFAZolin (ANCEF) IVPB 2g/100 mL premix  Status:  Discontinued        2 g 200 mL/hr over 30 Minutes Intravenous Every M-W-F (2000) 05/19/22 1742 05/19/22 1743   05/20/22 1200  ceFAZolin (ANCEF) IVPB 2g/100 mL premix        2 g 200 mL/hr over 30 Minutes Intravenous Every M-W-F (Hemodialysis) 05/19/22 1743 05/26/22 2359   05/18/22 1530  vancomycin (VANCOCIN) capsule 125 mg       See Hyperspace for full Linked Orders Report.   125 mg Oral 4 times daily 05/18/22 1444 05/22/22 0959   05/17/22 1000  vancomycin (VANCOCIN) 50 mg/mL oral solution SOLN 125 mg  Status:  Discontinued         125 mg Oral Every 12 hours 05/07/22 1314 05/15/22 1913   05/17/22 1000  vancomycin (VANCOCIN) capsule 125 mg  Status:  Discontinued        125 mg Oral Every 12 hours 05/15/22 1913 05/18/22 1444   05/15/22 1800  ceFAZolin (ANCEF) IVPB 1 g/50 mL premix  Status:  Discontinued        1 g 100 mL/hr over 30 Minutes Intravenous Every 24 hours 05/15/22 1639 05/19/22 1742   05/15/22 1345  ceFAZolin (ANCEF) IVPB 1 g/50 mL premix        1 g 100 mL/hr over 30 Minutes Intravenous  Once 05/15/22 1343 05/15/22 1405   05/15/22 1338  ceFAZolin (ANCEF) 1-4 GM/50ML-% IVPB       Note to Pharmacy: [name] E: cabinet override  05/15/22 1338 05/15/22 1423   05/08/22 1800  ceFAZolin (ANCEF) IVPB 2g/100 mL premix  Status:  Discontinued        2 g 200 mL/hr over 30 Minutes Intravenous Every M-W-F (1800) 05/07/22 1016 05/15/22 1639   05/08/22 1200  ceFAZolin (ANCEF) IVPB 1 g/50 mL premix  Status:  Discontinued        1 g 100 mL/hr over 30 Minutes Intravenous Every M-W-F (Hemodialysis) 05/07/22 0935 05/07/22 1016   05/07/22 1030  vancomycin (VANCOCIN) capsule 125 mg        125 mg Oral 4 times daily 05/07/22 0935 05/17/22 0959   05/06/22 1800  vancomycin (VANCOREADY) IVPB 500 mg/100 mL  Status:  Discontinued        500 mg 100 mL/hr over 60 Minutes Intravenous Every M-W-F (Hemodialysis) 05/06/22 1218 05/07/22 0935   05/06/22 1800  ceFEPIme (MAXIPIME) 2 g in sodium chloride 0.9 % 100 mL IVPB  Status:  Discontinued        2 g 200 mL/hr over 30 Minutes Intravenous Every M-W-F (Hemodialysis) 05/06/22 1218 05/07/22 0935   05/06/22 1145  ceFEPIme (MAXIPIME) 2 g in sodium chloride 0.9 % 100 mL IVPB        2 g 200 mL/hr over 30 Minutes Intravenous  Once 05/06/22 1143 05/06/22 1324   05/06/22 1145  vancomycin (VANCOCIN) IVPB 1000 mg/200 mL premix        1,000 mg 200 mL/hr over 60 Minutes Intravenous  Once 05/06/22 1143 05/06/22 1436        I have personally reviewed the following labs and  images: CBC: Recent Labs  Lab 05/16/22 0121 05/17/22 0201 05/18/22 0712 05/19/22 0101 05/20/22 0307  WBC 12.6* 6.5 7.3 6.9 8.5  NEUTROABS 11.4* 4.0 4.3 4.3 5.0  HGB 9.5* 9.5* 10.0* 9.4* 9.4*  HCT 29.9* 29.0* 29.9* 29.0* 28.9*  MCV 103.5* 101.4* 101.4* 102.8* 102.8*  PLT 226 171 182 183 244   BMP &GFR Recent Labs  Lab 05/16/22 0121 05/17/22 0201 05/18/22 0712 05/19/22 0101 05/20/22 0307  NA 133* 132* 135 137 139  K 5.5* 3.6 4.0 3.5 3.9  CL 96* 93* 96* 94* 100  CO2 21* 26 24 27 27   GLUCOSE 144* 98 96 133* 124*  BUN 30* 15 27* 11 23  CREATININE 5.63* 3.11* 5.12* 2.92* 4.45*  CALCIUM 9.6 9.3 9.8 9.5 9.4  MG 2.2 1.8 1.9 1.7 1.8  PHOS 4.9* 2.7 3.1 1.9* 1.6*   Estimated Creatinine Clearance: 8.2 mL/min (A) (by C-G formula based on SCr of 4.45 mg/dL (H)). Liver & Pancreas: Recent Labs  Lab 05/16/22 0121 05/17/22 0201 05/18/22 0712 05/19/22 0101 05/20/22 0307  AST  --  26 22 22 19   ALT  --  <5 <5 <5 <5  ALKPHOS  --  89 77 84 93  BILITOT  --  0.5 0.8 0.6 0.6  PROT  --  6.7 6.9 6.6 6.8  ALBUMIN 3.0* 3.0* 2.9* 2.8* 2.9*   No results for input(s): LIPASE, AMYLASE in the last 168 hours. No results for input(s): AMMONIA in the last 168 hours. Diabetic: No results for input(s): HGBA1C in the last 72 hours. No results for input(s): GLUCAP in the last 168 hours. Cardiac Enzymes: No results for input(s): CKTOTAL, CKMB, CKMBINDEX, TROPONINI in the last 168 hours. No results for input(s): PROBNP in the last 8760 hours. Coagulation Profile: No results for input(s): INR, PROTIME in the last 168 hours. Thyroid Function Tests: No results for input(s): TSH, T4TOTAL, FREET4, T3FREE, THYROIDAB  in the last 72 hours. Lipid Profile: No results for input(s): CHOL, HDL, LDLCALC, TRIG, CHOLHDL, LDLDIRECT in the last 72 hours. Anemia Panel: No results for input(s): VITAMINB12, FOLATE, FERRITIN, TIBC, IRON, RETICCTPCT in the last 72 hours. Urine analysis:    Component Value Date/Time    COLORURINE YELLOW 01/30/2011 1648   APPEARANCEUR CLOUDY (A) 01/30/2011 1648   LABSPEC 1.016 01/30/2011 1648   PHURINE 5.0 01/30/2011 1648   GLUCOSEU NEGATIVE 10/14/2009 0747   HGBUR SMALL (A) 01/30/2011 1648   BILIRUBINUR NEGATIVE 01/30/2011 1648   KETONESUR NEGATIVE 01/30/2011 1648   PROTEINUR 100 (A) 01/30/2011 1648   UROBILINOGEN 0.2 01/30/2011 1648   NITRITE NEGATIVE 01/30/2011 1648   LEUKOCYTESUR SMALL (A) 01/30/2011 1648   Sepsis Labs: Invalid input(s): PROCALCITONIN, LACTICIDVEN  Microbiology: No results found for this or any previous visit (from the past 240 hour(s)).  Radiology Studies: CT HEAD WO CONTRAST ( )  Result Date: 05/20/2022 CLINICAL DATA:  Head trauma EXAM: CT HEAD WITHOUT CONTRAST TECHNIQUE: Contiguous axial images were obtained from the base of the skull through the vertex without intravenous contrast. RADIATION DOSE REDUCTION: This exam was performed according to the departmental dose-optimization program which includes automated exposure control, adjustment of the mA and/or kV according to patient size and/or use of iterative reconstruction technique. COMPARISON:  CT brain 11/07/2021 FINDINGS: Brain: No acute territorial infarction, hemorrhage or intracranial mass. Advanced atrophy. Advanced chronic small vessel ischemic changes of the white matter. Stable ventricle size Vascular: No hyperdense vessels. Vertebral and carotid vascular calcification. Skull: Normal. Negative for fracture or focal lesion. Sinuses/Orbits: No acute finding. Other: Increased atlantal axial interval up to 7 mm but appears chronic as compared with November CT. This could be secondary to inflammatory arthropathy given inflammatory changes at the tip of the dens. IMPRESSION: 1. No CT evidence for acute intracranial abnormality. 2. Atrophy and chronic small vessel ischemic changes of the white matter Electronically Signed   By: Jasmine Pang M.D.   On: 05/20/2022 01:40      Berkley Cronkright T.  Gerrell Tabet [facility]  If 7PM-7AM, please contact night-coverage [email or web address] 05/20/2022, 5:45 PM

## 2022-05-20 NOTE — Progress Notes (Signed)
   VASCULAR SURGERY ASSESSMENT & PLAN:   END-STAGE RENAL DISEASE: Her graft continues to have a good thrill and is being accessed above the wound.  The wound is healing well and when she is ready for discharge we can arrange for home health to do her dressing changes.  If it continues to improve she may just need some bacitracin and a Band-Aid over the small open area.  She will need her sutures out in 2 weeks.   SUBJECTIVE:   No complaints this morning.  She did reportedly fall.  PHYSICAL EXAM:   Vitals:   05/19/22 2300 05/20/22 0300 05/20/22 0500 05/20/22 0754  BP: (!) 81/30 (!) 119/53  (!) 98/59  Pulse:  82  82  Resp:  17  16  Temp:  97.7 F (36.5 C)  98.1 F (36.7 C)  TempSrc:  Oral  Oral  SpO2:  97%  100%  Weight:   80.7 kg   Height:       Her graft has a palpable thrill. The wound has minimal drainage.  LABS:   Lab Results  Component Value Date   WBC 8.5 05/20/2022   HGB 9.4 (L) 05/20/2022   HCT 28.9 (L) 05/20/2022   MCV 102.8 (H) 05/20/2022   PLT 244 05/20/2022   PROBLEM LIST:    Principal Problem:   NSTEMI (non-ST elevated myocardial infarction) ([place]) Active Problems:   End-stage renal disease on hemodialysis (HCC)   C. difficile diarrhea   Hypotension   Acute on chronic diastolic (congestive) heart failure (HCC)   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HCC)   Physical deconditioning   Dermatitis   CURRENT MEDS:    atorvastatin  80 mg Oral Daily   B-complex with vitamin C  1 tablet Oral Daily   And   folic acid  1 mg Oral Daily   Chlorhexidine Gluconate Cloth  6 each Topical Q0600   clopidogrel  75 mg Oral Daily   escitalopram  10 mg Oral Daily   famotidine  20 mg Oral Daily   feeding supplement  237 mL Oral BID BM   heparin injection (subcutaneous)  5,000 Units Subcutaneous Q8H   liver oil-zinc oxide   Topical Q4H   midodrine  10 mg Oral TID WC   phosphorus  500 mg Oral BID   sodium chloride flush  3 mL Intravenous Q12H   vancomycin  125 mg Oral QID    Followed by   Derrill Memo ON 05/22/2022] vancomycin  125 mg Oral BID    [name] Office: [phone] 05/20/2022

## 2022-05-21 DIAGNOSIS — A0472 Enterocolitis due to Clostridium difficile, not specified as recurrent: Secondary | ICD-10-CM | POA: Diagnosis not present

## 2022-05-21 DIAGNOSIS — I5033 Acute on chronic diastolic (congestive) heart failure: Secondary | ICD-10-CM | POA: Diagnosis not present

## 2022-05-21 DIAGNOSIS — I214 Non-ST elevation (NSTEMI) myocardial infarction: Secondary | ICD-10-CM | POA: Diagnosis not present

## 2022-05-21 DIAGNOSIS — G4700 Insomnia, unspecified: Secondary | ICD-10-CM

## 2022-05-21 DIAGNOSIS — I959 Hypotension, unspecified: Secondary | ICD-10-CM | POA: Diagnosis not present

## 2022-05-21 MED ORDER — CHLORHEXIDINE GLUCONATE CLOTH 2 % EX PADS
6.0000 | MEDICATED_PAD | Freq: Every day | CUTANEOUS | Status: DC
  Administered 2022-05-21 – 2022-05-22 (×2): 6 via TOPICAL

## 2022-05-21 MED ORDER — MIDODRINE HCL 10 MG PO TABS: 10.0000 mg | ORAL_TABLET | Freq: Three times a day (TID) | ORAL | Status: DC

## 2022-05-21 MED ORDER — ZOLPIDEM TARTRATE 5 MG PO TABS: 5.0000 mg | ORAL_TABLET | Freq: Every evening | ORAL | 0 refills | Status: DC | PRN

## 2022-05-21 MED ORDER — VANCOMYCIN HCL 125 MG PO CAPS
ORAL_CAPSULE | ORAL | 0 refills | Status: AC
Start: 2022-05-21 — End: 2022-06-02

## 2022-05-21 MED ORDER — HYDROXYZINE HCL 25 MG PO TABS: 25.0000 mg | ORAL_TABLET | Freq: Three times a day (TID) | ORAL | 0 refills | Status: DC | PRN

## 2022-05-21 MED ORDER — DOXYCYCLINE HYCLATE 100 MG PO CAPS
100.0000 mg | ORAL_CAPSULE | Freq: Two times a day (BID) | ORAL | 0 refills | Status: DC
Start: 2022-05-27 — End: 2022-07-02

## 2022-05-21 NOTE — Progress Notes (Signed)
   05/21/22 0940  Clinical Encounter Type  Visited With Patient  Visit Type Initial;Spiritual support;Social support  Referral From Chaplain  Consult/Referral To [employer] visited with patient during rounding on the floor. Patient had called, just before I stopped in, and needed several physical needs addressed so I was not able to visit. I dismissed myself and will stop by again if time allows.   Danice Goltz [facility]  [phone]

## 2022-05-21 NOTE — Progress Notes (Signed)
Physical Therapy Treatment Patient Details Name: [name] MRN: [id number] DOB: 1951/05/20 Today's Date: 05/21/2022   History of Present Illness Pt is a 71 y.o. female admitted 05/06/22 with SOB and infection. 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 S/p RUE AVG excision with revision 5/15. Course complicated by hypotension perioperatively; ETT 5/15-5/16. Workup for septic shock secondary to MRSA bacteremia. 6/2 pt s/p exploration of LUE graft. PMH includes ESRD (HD MWF), CKD, CHF, HTN, HLD.    PT Comments    Pt received in supine, bed pad soiled and pt had just finished eating breakfast. Pt agreeable to therapy session with encouragement, and following 1-step cues with repetition and increased time. Pt frequently distracted and needs reinforcement throughout session to stay on task and reminders for goal of transfer OOB to chair and progressing standing activity. Pt with frequent loose stools, RN/MD notified. BP taken on RLE initially soft but improved after activity and transfer to recliner. Pt tolerated >30 mins total of activity this date, including seated/standing balance tasks. Pt continues to benefit from PT services to progress toward functional mobility goals.   Recommendations for follow up therapy are one component of a multi-disciplinary discharge planning process, led by the attending physician.  Recommendations may be updated based on patient status, additional functional criteria and insurance authorization.  Follow Up Recommendations  Skilled nursing-short term rehab (<3 hours/day)     Assistance Recommended at Discharge Frequent or constant Supervision/Assistance  Patient can return home with the following A lot of help with bathing/dressing/bathroom;Assistance with cooking/housework;Direct supervision/assist for medications management;Direct supervision/assist for financial management;Assist for transportation;Help  with stairs or ramp for entrance;Two people to help with walking and/or transfers   Equipment Recommendations  None recommended by PT    Recommendations for Other Services OT consult     Precautions / Restrictions Precautions Precautions: Fall Precaution Comments: Enteric with bowel incont, BP Restrictions Weight Bearing Restrictions: No     Mobility  Bed Mobility Overal bed mobility: Needs Assistance Bed Mobility: Rolling, Supine to Sit Rolling: Min guard Sidelying to sit: Mod assist, HOB elevated       General bed mobility comments: modA to elevate trunk and advance hips to EOB, pt able to perform second rep from semi-sidelying with minA and cues after impulsively return to S/L    Transfers Overall transfer level: Needs assistance Equipment used: Ambulation equipment used Transfers: Sit to/from Stand, Bed to chair/wheelchair/BSC Sit to Stand: Mod assist, From elevated surface, Min assist           General transfer comment: pt needing heavy minA for standing from elevated air bed and modA to stand from recliner with air cushion to [employer]; x2 reps from EOB<>Stedy, x1 rep from elevated Stedy seat and x5 reps from recliner (some partial stands due to fatigue at end) Transfer via Lift Equipment: Stedy  Ambulation/Gait             Pre-gait activities: pt able to minimally lift foot from [place] but unable to safely step pivot to chair due to fatigue/mild lightheadedness and frequent loose stools         Balance Overall balance assessment: Needs assistance Sitting-balance support: Feet supported, Bilateral upper extremity supported, Single extremity supported Sitting balance-Leahy Scale: Poor Sitting balance - Comments: Pt tending to lean to R side due to desire to return to supine, seeming decreased recall of soiled bed and plan for standing peri-care; variable min guard to minA for seated  balance at EOB on air bed. Postural control: Right lateral lean,  Posterior lean Standing balance support: Bilateral upper extremity supported Standing balance-Leahy Scale: Poor Standing balance comment: stood for brief time this session in [place] with +1, pt continues to sit abruptly but able to stand longer for peri-care/placement of Stedy flaps with +2 min/modA at hips for improved hip extension, up to 2 minutes at a time                            Cognition Arousal/Alertness:  (initially lethargic in supine but more alert once EOB and with standing) Behavior During Therapy: WFL for tasks assessed/performed, Impulsive Overall Cognitive Status: No family/caregiver present to determine baseline cognitive functioning            General Comments: A&O to month/year and day, not to day of week or time of day, pt following 1-step commands well but impulsive at times (to return to supine after initially sitting up) and c/o tiredness but will continue efforts with encouragement. Pt appreciative of youtube music channel on with popular hits from [place] during session.        Exercises Other Exercises Other Exercises: supine BLE AROM: heel slides x10 reps Other Exercises: STS x 8 reps for BLE strengthening (with breaks intermittently due to incontinence) Other Exercises: seated BLE AROM: LAQ x 5 reps    General Comments General comments (skin integrity, edema, etc.): Pt with frequent loose yellow watery stools, RN/MD notified. supine BP 116/43 HR 90; BP 114/55 HR 88 sitting up in recliner      Pertinent Vitals/Pain Pain Assessment Pain Assessment: Faces Faces Pain Scale: Hurts a little bit Pain Location: bottom Pain Descriptors / Indicators: Discomfort Pain Intervention(s): Monitored during session, Repositioned           PT Goals (current goals can now be found in the care plan section) Acute Rehab PT Goals Patient Stated Goal: to return to nursing home PT Goal Formulation: With patient Time For Goal Achievement: 05/22/22 Progress  towards PT goals: Progressing toward goals    Frequency    Min 2X/week      PT Plan Current plan remains appropriate       AM-PAC PT "6 Clicks" Mobility   Outcome Measure  Help needed turning from your back to your side while in a flat bed without using bedrails?: A Little Help needed moving from lying on your back to sitting on the side of a flat bed without using bedrails?: A Lot Help needed moving to and from a bed to a chair (including a wheelchair)?: Total Help needed standing up from a chair using your arms (e.g., wheelchair or bedside chair)?: A Lot Help needed to walk in hospital room?: Total Help needed climbing 3-5 steps with a railing? : Total 6 Click Score: 10    End of Session Equipment Utilized During Treatment: Gait belt Activity Tolerance: Patient tolerated treatment well;Treatment limited secondary to medical complications (Comment) (loose stools limiting standing tolerance) Patient left: in chair;with call bell/phone within reach;with chair alarm set Nurse Communication: Mobility status;Other (comment) (freq loose stools, may benefit from rectal tube?) PT Visit Diagnosis: Unsteadiness on feet (R26.81);Other abnormalities of gait and mobility (R26.89);Muscle weakness (generalized) (M62.81);Difficulty in walking, not elsewhere classified (R26.2)     Time: 4854-6270 PT Time Calculation (min) (ACUTE ONLY): 40 min  Charges:  $Therapeutic Exercise: 8-22 mins $Therapeutic Activity: 23-37 mins  Houston Siren., PTA Acute Rehabilitation Services Secure Chat Preferred 9a-5:30pm Office: [place] 05/21/2022, 11:59 AM

## 2022-05-21 NOTE — Assessment & Plan Note (Signed)
Decreased Ambien to 5 mg daily as needed

## 2022-05-21 NOTE — Progress Notes (Signed)
Subjective: Seen in room awoken from sleep no complaints said tolerated dialysis yesterday  Objective Vital signs in last 24 hours: Vitals:   05/20/22 1623 05/20/22 2325 05/21/22 0451 05/21/22 0842  BP: (!) 111/34 (!) 114/52 (!) 109/96 (!) 106/44  Pulse: 82 98 (!) 104 (!) 102  Resp: '18 18 18 18  '$ Temp: (!) 97.5 F (36.4 C)  97.9 F (36.6 C) 98.1 F (36.7 C)  TempSrc: Oral  Oral Oral  SpO2: 94% 95% 93% 100%  Weight:      Height:       Weight change: -35.7 kg  Physical Exam: General: Alert elderly female chronically ill pleasant, NAD Heart: RRR no MRG Lungs: CTA, unlabored breathing Abdomen: NABS, soft NTND Extremities: No pedal edema Dialysis Access: RUA AVGG + bruit, with dry clean dressing.  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 Now appearing euvolemic , 6/05 AM low blood pressure requiring IV fluid , no UF HD  6/05 /or 6/07 kept even, HD midodrine 10 mg TID started over weekend.  Prior week copious diarrhea requiring  rectal tube now resolved, may be playing catch up with loss of volume./Versus cardiac issue ESRD = HD MWF on schedule, next HD tomorrow 6/09 C Diff + - per PMD /earlier in admit required rectal tube/diarrhea resolved per patient.  Plans for admit NSTEMI -  cardiology has evaluated= not interventional candidate due to multiple medical issues/Plavix/med rx.  This a.m. no chest pain or shortness of breath Recent admit MSSA sepsis/ AVG infection -AVG still functional.  Noted healing per Dr. Scot Dock //ID NOTES 6/06 = continue IV cefazolin until 6/13 with plans for oral Doxy 100 mg twice daily indefinitely BP/ volume -see #1 , as OP also  had problems with low blood pressures using midodrine 10 mg tid   BED  wts  Questionable this a.m. stable 106/44 her baseline Anemia esrd -Hgb 9.4 =06/07 received Aranesp 40 on 6/03 will increase to 60 BMD ckd - C Ca high holding vitamin D. phos low at 1.6<1.9  replacing  with supplement ,,not on binder /hold Sensipar with low phosphorus (review op PTH recent=96<186<726) follow-up lab trend Nutrition 2.9 ALB /Diet changed to reg.diet with fluid restrictions with her low phosphorus and low potassium  , on protein supplement Dementia/debility= at baseline and at [facility] for SNF   Ernest Haber, PA-C [place] [phone] 05/21/2022,9:49 AM  LOS: 15 days   Labs: Basic Metabolic Panel: Recent Labs  Lab 05/18/22 0712 05/19/22 0101 05/20/22 0307  NA 135 137 139  K 4.0 3.5 3.9  CL 96* 94* 100  CO2 '24 27 27  '$ GLUCOSE 96 133* 124*  BUN 27* 11 23  CREATININE 5.12* 2.92* 4.45*  CALCIUM 9.8 9.5 9.4  PHOS 3.1 1.9* 1.6*   Liver Function Tests: Recent Labs  Lab 05/18/22 0712 05/19/22 0101 05/20/22 0307  AST '22 22 19  '$ ALT <5 <5 <5  ALKPHOS 77 84 93  BILITOT 0.8 0.6 0.6  PROT 6.9 6.6 6.8  ALBUMIN 2.9* 2.8* 2.9*   No results for input(s): "LIPASE", "AMYLASE" in the last 168 hours. No results for input(s): "  AMMONIA" in the last 168 hours. CBC: Recent Labs  Lab 05/16/22 0121 05/17/22 0201 05/18/22 0712 05/19/22 0101 05/20/22 0307  WBC 12.6* 6.5 7.3 6.9 8.5  NEUTROABS 11.4* 4.0 4.3 4.3 5.0  HGB 9.5* 9.5* 10.0* 9.4* 9.4*  HCT 29.9* 29.0* 29.9* 29.0* 28.9*  MCV 103.5* 101.4* 101.4* 102.8* 102.8*  PLT 226 171 182 183 244   Cardiac Enzymes: No results for input(s): "CKTOTAL", "CKMB", "CKMBINDEX", "TROPONINI" in the last 168 hours. CBG: No results for input(s): "GLUCAP" in the last 168 hours.  Studies/Results: CT HEAD WO CONTRAST (5MM)  Result Date: 05/20/2022 CLINICAL DATA:  Head trauma EXAM: CT HEAD WITHOUT CONTRAST TECHNIQUE: Contiguous axial images were obtained from the base of the skull through the vertex without  intravenous contrast. RADIATION DOSE REDUCTION: This exam was performed according to the departmental dose-optimization program which includes automated exposure control, adjustment of the mA and/or kV according to patient size and/or use of iterative reconstruction technique. COMPARISON:  CT brain 11/07/2021 FINDINGS: Brain: No acute territorial infarction, hemorrhage or intracranial mass. Advanced atrophy. Advanced chronic small vessel ischemic changes of the white matter. Stable ventricle size Vascular: No hyperdense vessels. Vertebral and carotid vascular calcification. Skull: Normal. Negative for fracture or focal lesion. Sinuses/Orbits: No acute finding. Other: Increased atlantal axial interval up to 7 mm but appears chronic as compared with November CT. This could be secondary to inflammatory arthropathy given inflammatory changes at the tip of the dens. IMPRESSION: 1. No CT evidence for acute intracranial abnormality. 2. Atrophy and chronic small vessel ischemic changes of the white matter Electronically Signed   By: Donavan Foil M.D.   On: 05/20/2022 01:40   Medications:   ceFAZolin (ANCEF) IV Stopped (05/20/22 1602)    atorvastatin  80 mg Oral Daily   B-complex with vitamin C  1 tablet Oral Daily   And   folic acid  1 mg Oral Daily   Chlorhexidine Gluconate Cloth  6 each Topical Q0600   clopidogrel  75 mg Oral Daily   escitalopram  10 mg Oral Daily   famotidine  20 mg Oral Daily   feeding supplement  237 mL Oral BID BM   heparin injection (subcutaneous)  5,000 Units Subcutaneous Q8H   midodrine  10 mg Oral TID WC   sodium chloride flush  3 mL Intravenous Q12H   vancomycin  125 mg Oral QID   Followed by   Derrill Memo ON 05/22/2022] vancomycin  125 mg Oral BID

## 2022-05-21 NOTE — Discharge Summary (Signed)
Physician Discharge Summary  [name] KXF:[id number] DOB: 06-04-1951 DOA: 05/06/2022  PCP: Kathryn Carol, MD  Admit date: 05/06/2022 Discharge date: 05/21/2022 Admitted From: SNF Disposition: SNF Recommendations for Outpatient Follow-up:  Follow-up with ID and vascular surgery per their recommendation. Check CBC and BMP in about a week Please follow up on the following pending results: None   Discharge Condition: Stable CODE STATUS: DNR/DNI   Hospital course [name] is a 71 yo female with PMH dCHF, ESRD on HD, CVA, CAD, HLD, HTN, GERD  and recent hospitalization from 5/14-5/22 with septic shock due to staph bacteremia from infected AV graft for which she was discharged on IV Ancef returning with shortness of breath and admitted with non-STEMI and fluid overload.  She was started on IV heparin.  Cardiology consulted.  She was deemed to be not a good interventional candidate.  Cardiology recommended medical management and palliative care consult.   Hospital stay stay complicated by wound dehiscence adjacent to L arm graft, now s/p exploration of L arm graft and evacuation of hematoma by vascular on 6/2.  She was on cefepime and vancomycin for MSSA bacteremia and left arm graft wound.  However, she developed C. Difficile, and antibiotics de-escalated to Ancef.  She was started on oral vancomycin for C. difficile on 05/06/2022.  ID recommended oral vancomycin for a total of 14 days followed by twice daily for 7 days after completion of IV Ancef which would be on 05/26/2022.  She is discharged on IV Ancef with dialysis through 05/26/2022.  She will be on p.o. doxycycline 100 mg twice daily after completing IV Ancef for suppressive therapy.  She is more or less stabilized for discharge to SNF as of 05/20/2022.   See individual problem list below for more on hospital course.  Problems addressed during this hospitalization Problem  NSTEMI  Acute on chronic diastolic CHF  Vascular graft  infection  and MSSA bacteremia  Hypotension  C. Difficile Diarrhea  End-Stage Renal Disease On Hemodialysis (Hcc)  Dermatitis  Physical Deconditioning  Malnutrition of moderate degree  Insomnia    Assessment and Plan: * NSTEMI Deemed to be not a good interventional candidate by cardiology Cardiology recommended medical management and palliative care consult. Patient is DNR/DNI.  May benefit from palliative follow-up outpatient She is currently chest pain-free.  Acute on chronic diastolic CHF No cardiopulmonary symptoms this morning.  Fluid management with dialysis  Vascular graft infection  and MSSA bacteremia Plan for Ancef post HD MWF until 6/13, then oral doxy 100 mg BID indefinitely  S/p exploration of L arm graft and evacuation of hematoma on 6/2 for wound dehiscence  Per vascular surgery, bacitracin and Band-Aid, and outpatient follow-up.   Vascular surgery to arrange outpatient follow-up  Hypotension Remains hypotensive but improved. -Continue midodrine   C. difficile diarrhea Diarrhea seems to have resolved.  Continue oral vancomycin 125 mg 4 times daily for a total of 14 days from 05/06/2022 followed by twice daily through 06/02/2022.  End-stage renal disease on hemodialysis [facility]) Per nephrology  Dermatitis Skin breakdown from ongoing diarrhea.  Diarrhea seems to be resolving. Continue Desitin cream  Physical deconditioning Continue therapy  Malnutrition of moderate degree Nutrition Status: Body mass index is 18.15 kg/m. Multivitamin and Nepro Could benefit from dietitian follow-up at SNF.  Insomnia Decreased Ambien to 5 mg daily as needed   Pressure skin injury: POA Pressure Injury 10/27/21 Coccyx Mid Stage 1 -  Intact skin with non-blanchable redness of a localized area usually  over a bony prominence. (Active)  10/27/21 2200  Location: Coccyx  Location Orientation: Mid  Staging: Stage 1 -  Intact skin with non-blanchable redness of a localized area  usually over a bony prominence.  Wound Description (Comments):   Present on Admission: Yes    Vital signs Vitals:   05/20/22 1623 05/20/22 2325 05/21/22 0451 05/21/22 0842  BP: (!) 111/34 (!) 114/52 (!) 109/96 (!) 106/44  Pulse: 82 98 (!) 104 (!) 102  Temp: (!) 97.5 F (36.4 C)  97.9 F (36.6 C) 98.1 F (36.7 C)  Resp: '18 18 18 18  '$ Height:      Weight:      SpO2: 94% 95% 93% 100%  TempSrc: Oral  Oral Oral  BMI (Calculated):         Discharge exam  GENERAL: Appears frail.  No distress. HEENT: MMM.  Vision and hearing grossly intact.  NECK: Supple.  No apparent JVD.  RESP:  No IWOB.  Fair aeration bilaterally. CVS:  RRR. Heart sounds normal.  ABD/GI/GU: BS+. Abd soft, NTND.  MSK/EXT:  Moves extremities.  Significant muscle mass and subcu fat loss. SKIN: no apparent skin lesion or wound NEURO: Sleepy but wakes to voice.  Fairly oriented.  No apparent focal neuro deficit. PSYCH: Calm. Normal affect.   Discharge Instructions  Allergies as of 05/21/2022       Reactions   Sulfa Antibiotics Other (See Comments)   Per patient, both parents allergic-so will not take   Adhesive [tape] Itching        Medication List     STOP taking these medications    cephALEXin 500 MG capsule Commonly known as: KEFLEX   loperamide 2 MG capsule Commonly known as: IMODIUM   polyethylene glycol 17 g packet Commonly known as: MIRALAX / GLYCOLAX       TAKE these medications    acetaminophen 500 MG tablet Commonly known as: TYLENOL Take 1 tablet (500 mg total) by mouth every 6 (six) hours as needed. What changed: reasons to take this   albuterol 108 (90 Base) MCG/ACT inhaler Commonly known as: VENTOLIN HFA Inhale 1-2 puffs into the lungs every 6 (six) hours as needed for wheezing or shortness of breath. What changed:  how much to take reasons to take this   albuterol (2.5 MG/3ML) 0.083% nebulizer solution Commonly known as: PROVENTIL Take 3 mLs (2.5 mg total) by  nebulization every 4 (four) hours as needed for wheezing. What changed: when to take this   atorvastatin 80 MG tablet Commonly known as: LIPITOR Take 80 mg by mouth at bedtime.   ceFAZolin 2-4 GM/100ML-% IVPB Commonly known as: ANCEF Inject 100 mLs (2 g total) into the vein every Monday, Wednesday, and Friday at 8 PM for 22 days. Patient will receive this after her HD at HD center.   clopidogrel 75 MG tablet Commonly known as: Plavix Take 1 tablet (75 mg total) by mouth daily. What changed: when to take this   Dialyvite 800 0.8 MG Wafr Take 1 tablet by mouth every morning.   doxycycline 100 MG capsule Commonly known as: VIBRAMYCIN Take 1 capsule (100 mg total) by mouth 2 (two) times daily. Start taking on: May 27, 2022   escitalopram 10 MG tablet Commonly known as: LEXAPRO Take 10 mg by mouth every morning.   hydrOXYzine 25 MG tablet Commonly known as: ATARAX Take 1 tablet (25 mg total) by mouth every 8 (eight) hours as needed for itching.   midodrine 10 MG tablet  Commonly known as: PROAMATINE Take 1 tablet (10 mg total) by mouth 3 (three) times daily with meals. What changed:  medication strength how much to take   Nepro Liqd Take 237 mLs by mouth 2 (two) times daily between meals. What changed: Another medication with the same name was removed. Continue taking this medication, and follow the directions you see here.   ondansetron 8 MG tablet Commonly known as: ZOFRAN Take 8 mg by mouth every 12 (twelve) hours as needed for nausea or vomiting.   OVER THE COUNTER MEDICATION Take 1 tablet by mouth every morning. Folic acid - vitamin B complex - vitamin C - selenium - zinc 3 mg   pantoprazole 40 MG tablet Commonly known as: PROTONIX Take 1 tablet (40 mg total) by mouth daily.   sevelamer 800 MG tablet Commonly known as: RENAGEL Take 1,600 mg by mouth 3 (three) times daily with meals.   vancomycin 125 MG capsule Commonly known as: VANCOCIN Take 1 capsule (125  mg total) by mouth 4 (four) times daily for 2 days, THEN 1 capsule (125 mg total) in the morning and at bedtime for 10 days. Start taking on: May 21, 2022   zolpidem 5 MG tablet Commonly known as: AMBIEN Take 1 tablet (5 mg total) by mouth at bedtime as needed for up to 10 days for sleep (Insomnia). What changed:  medication strength how much to take when to take this reasons to take this        Consultations: Vascular surgery Nephrology Infectious disease  Procedures/Studies: 6/2-left arm graft exploration and evacuation of hematoma   CT HEAD WO CONTRAST (5MM)  Result Date: 05/20/2022 CLINICAL DATA:  Head trauma EXAM: CT HEAD WITHOUT CONTRAST TECHNIQUE: Contiguous axial images were obtained from the base of the skull through the vertex without intravenous contrast. RADIATION DOSE REDUCTION: This exam was performed according to the departmental dose-optimization program which includes automated exposure control, adjustment of the mA and/or kV according to patient size and/or use of iterative reconstruction technique. COMPARISON:  CT brain 11/07/2021 FINDINGS: Brain: No acute territorial infarction, hemorrhage or intracranial mass. Advanced atrophy. Advanced chronic small vessel ischemic changes of the white matter. Stable ventricle size Vascular: No hyperdense vessels. Vertebral and carotid vascular calcification. Skull: Normal. Negative for fracture or focal lesion. Sinuses/Orbits: No acute finding. Other: Increased atlantal axial interval up to 7 mm but appears chronic as compared with November CT. This could be secondary to inflammatory arthropathy given inflammatory changes at the tip of the dens. IMPRESSION: 1. No CT evidence for acute intracranial abnormality. 2. Atrophy and chronic small vessel ischemic changes of the white matter Electronically Signed   By: Donavan Foil M.D.   On: 05/20/2022 01:40   DG Abd 1 View  Result Date: 05/18/2022 CLINICAL DATA:  71 year old female with  abdominal pain. EXAM: ABDOMEN - 1 VIEW COMPARISON:  CT abdomen pelvis from 01/23/2022 FINDINGS: The bowel gas pattern is normal. Extensive atherosclerotic calcifications. Surgical clips in the right upper quadrant. IMPRESSION: 1. Nonobstructive bowel gas pattern. 2.  Aortic Atherosclerosis (ICD10-I70.0). Electronically Signed   By: Ruthann Cancer M.D.   On: 05/18/2022 11:47   DG CHEST PORT 1 VIEW  Result Date: 05/14/2022 CLINICAL DATA:  Shortness of breath Chest pain EXAM: PORTABLE CHEST - 1 VIEW COMPARISON:  05/06/2022 FINDINGS: Unchanged cardiomegaly. Interval improvement of pulmonary vascular congestion. Diffusely calcified thoracic aorta. The lungs are clear. IMPRESSION: 1. Unchanged cardiomegaly. 2. Interval improvement of pulmonary vascular congestion. Electronically Signed   By: Sharen Heck  Mir M.D.   On: 05/14/2022 11:13   DG Chest Port 1 View  Result Date: 05/06/2022 CLINICAL DATA:  Shortness of breath EXAM: PORTABLE CHEST 1 VIEW COMPARISON:  Nine days ago FINDINGS: Cardiomegaly and vascular pedicle widening with diffuse interstitial coarsening including Kerley lines. No visible effusion or pneumothorax. Artifact from EKG leads. IMPRESSION: CHF pattern. Electronically Signed   By: Jorje Guild M.D.   On: 05/06/2022 07:11   ECHOCARDIOGRAM LIMITED  Result Date: 04/28/2022    ECHOCARDIOGRAM LIMITED REPORT   Patient Name:   SIANA PANAMENO Date of Exam: 04/28/2022 Medical Rec #:  [id number]         Height:       62.0 in Accession #:    [id number]        Weight:       101.0 lb Date of Birth:  04-24-1951         BSA:          1.430 m Patient Age:    71 years          BP:           106/31 mmHg Patient Gender: F                 HR:           64 bpm. Exam Location:  Inpatient Procedure: Cardiac Doppler, Color Doppler and Limited Echo Indications:    Bacteremia  History:        Patient has prior history of Echocardiogram examinations, most                 recent 02/22/2021. CHF; Risk Factors:Hypertension.   Sonographer:    Joette Catching RCS Referring Phys: [id number] Kemp  1. No left ventricular thrombus is seen. Left ventricular ejection fraction, by estimation, is 50 to 55%. The left ventricle has low normal function. The left ventricle demonstrates regional wall motion abnormalities (see scoring diagram/findings for description). There is moderate hypokinesis of the left ventricular, entire inferolateral wall.  2. The right ventricular size is moderately enlarged. There is severely elevated pulmonary artery systolic pressure.  3. Left atrial size was moderately dilated.  4. Right atrial size was moderately dilated.  5. Mild to moderate mitral valve regurgitation. No evidence of mitral stenosis.  6. Tricuspid valve regurgitation is moderate to severe.  7. The aortic valve is tricuspid. There is moderate calcification of the aortic valve. There is moderate thickening of the aortic valve. Aortic valve regurgitation is trivial. Aortic valve sclerosis/calcification is present, without any evidence of aortic stenosis.  8. The inferior vena cava is dilated in size with <50% respiratory variability, suggesting right atrial pressure of 15 mmHg. Conclusion(s)/Recommendation(s): No evidence of valvular vegetations on this transthoracic echocardiogram. Consider a transesophageal echocardiogram to exclude infective endocarditis if clinically indicated. FINDINGS  Left Ventricle: No left ventricular thrombus is seen. Left ventricular ejection fraction, by estimation, is 50 to 55%. The left ventricle has low normal function. The left ventricle demonstrates regional wall motion abnormalities. Moderate hypokinesis of the left ventricular, entire inferolateral wall. The left ventricular internal cavity size was normal in size. There is no left ventricular hypertrophy. Right Ventricle: The right ventricular size is moderately enlarged. There is severely elevated pulmonary artery systolic pressure. The tricuspid  regurgitant velocity is 3.69 m/s, and with an assumed right atrial pressure of 15 mmHg, the estimated right ventricular systolic pressure is 10.6 mmHg. Left Atrium: Left atrial size was moderately dilated. Right Atrium:  Right atrial size was moderately dilated. Pericardium: There is no evidence of pericardial effusion. Mitral Valve: Mild to moderate mitral annular calcification. Mild to moderate mitral valve regurgitation, with centrally-directed jet. No evidence of mitral valve stenosis. Tricuspid Valve: The tricuspid valve is normal in structure. Tricuspid valve regurgitation is moderate to severe. Aortic Valve: The aortic valve is tricuspid. There is moderate calcification of the aortic valve. There is moderate thickening of the aortic valve. Aortic valve regurgitation is trivial. Aortic regurgitation PHT measures 585 msec. Aortic valve sclerosis/calcification is present, without any evidence of aortic stenosis. Aortic valve peak gradient measures 13.7 mmHg. Pulmonic Valve: The pulmonic valve was not well visualized. Pulmonic valve regurgitation is not visualized. Venous: The inferior vena cava is dilated in size with less than 50% respiratory variability, suggesting right atrial pressure of 15 mmHg. The inferior vena cava and the hepatic vein show a pattern of systolic flow reversal, suggestive of tricuspid regurgitation. IAS/Shunts: No atrial level shunt detected by color flow Doppler. LEFT VENTRICLE PLAX 2D LVIDd:         4.97 cm LVIDs:         3.68 cm LV PW:         0.80 cm LV IVS:        0.70 cm  LV Volumes (MOD) LV vol d, MOD A2C: 126.0 ml LV vol d, MOD A4C: 76.1 ml LV vol s, MOD A2C: 69.1 ml LV vol s, MOD A4C: 35.6 ml LV SV MOD A2C:     56.9 ml LV SV MOD A4C:     76.1 ml LV SV MOD BP:      50.1 ml RIGHT VENTRICLE TAPSE (M-mode): 2.3 cm AORTIC VALVE AV Vmax:      185.00 cm/s AV Peak Grad: 13.7 mmHg LVOT Vmax:    113.00 cm/s AI PHT:       585 msec MR Peak grad:   131.3 mmHg   TRICUSPID VALVE MR Vmax:         573.00 cm/s  TR Peak grad:   54.5 mmHg MR PISA:        1.01 cm     TR Vmax:        369.00 cm/s MR PISA Radius: 0.40 cm Dani Gobble Croitoru MD Electronically signed by Sanda Klein MD Signature Date/Time: 04/28/2022/12:56:08 PM    Final    DG CHEST PORT 1 VIEW  Result Date: 04/27/2022 CLINICAL DATA:  Central line placement. EXAM: PORTABLE CHEST 1 VIEW COMPARISON:  Radiographs dated Apr 26, 2022 FINDINGS: The heart is enlarged. Atherosclerotic calcification of the aortic arch. Mild pulmonary vascular congestion. Lungs are clear without evidence of focal consolidation or large pleural effusion. Endotracheal tube with distal tip approximately 5 cm above the carina. Left IJ access central line with distal tip at the junction of the SVC and innominate vein. IMPRESSION: 1. Stable cardiomegaly with mild pulmonary vascular congestion. 2. No focal consolidation or large pleural effusion. 3. Left IJ access central line with distal tip at the junction of the SVC and innominate vein. Electronically Signed   By: Keane Police D.O.   On: 04/27/2022 16:50   CT CHEST WO CONTRAST  Result Date: 04/26/2022 CLINICAL DATA:  Pneumonia, short of breath, hypoxia EXAM: CT CHEST WITHOUT CONTRAST TECHNIQUE: Multidetector CT imaging of the chest was performed following the standard protocol without IV contrast. RADIATION DOSE REDUCTION: This exam was performed according to the departmental dose-optimization program which includes automated exposure control, adjustment of the mA and/or kV according to  patient size and/or use of iterative reconstruction technique. COMPARISON:  11/20/2019 FINDINGS: Cardiovascular: Unenhanced imaging of the heart demonstrates cardiomegaly without significant pericardial effusion. 4.1 cm ascending thoracic aortic aneurysm is unchanged. Evaluation of the vascular lumen is limited without IV contrast. There is extensive atherosclerosis of the aorta and coronary vasculature. Mediastinum/Nodes: No enlarged  mediastinal or axillary lymph nodes. Thyroid gland, trachea, and esophagus demonstrate no significant findings. Lungs/Pleura: Trace right pleural effusion. Hypoventilatory changes are seen at the lung bases, right greater than left. Chronic areas of subpleural scarring are seen bilaterally. Likely aspirated barium within the dependent aspects of the lower lobes, right greater than left, stable. No pneumothorax. Central airways are patent. Upper Abdomen: No acute abnormality.  Severe atherosclerosis. Musculoskeletal: Diffuse changes of renal osteodystrophy. No acute or destructive bony lesions. Patient is cachectic. Reconstructed images demonstrate no additional findings. IMPRESSION: 1. Trace right pleural effusion. 2. Minimal dependent atelectasis within the lower lobes. No acute airspace disease. 3. Stable cardiomegaly without significant pericardial effusion. 4. 4.1 cm ascending thoracic aortic aneurysm, stable. Recommend annual imaging followup by CTA or MRA. This recommendation follows 2010 ACCF/AHA/AATS/ACR/ASA/SCA/SCAI/SIR/STS/SVM Guidelines for the Diagnosis and Management of Patients with Thoracic Aortic Disease. Circulation. 2010; 121: O841-Y606. Aortic aneurysm NOS (ICD10-I71.9) 5.  Aortic Atherosclerosis (ICD10-I70.0). Electronically Signed   By: Randa Ngo M.D.   On: 04/26/2022 23:21   DG Chest Port 1 View  Result Date: 04/26/2022 CLINICAL DATA:  Short of breath, hypoxia EXAM: PORTABLE CHEST 1 VIEW COMPARISON:  02/08/2022 FINDINGS: Single frontal view of the chest demonstrate stable marked enlargement of the cardiac silhouette. Stable ectasia and atherosclerosis of the thoracic aorta. Chronic central vascular congestion without acute airspace disease, effusion, or pneumothorax. No acute bony abnormality. IMPRESSION: 1. Chronic cardiomegaly and central vascular congestion. No acute airspace disease. Electronically Signed   By: Randa Ngo M.D.   On: 04/26/2022 20:19       The results of  significant diagnostics from this hospitalization (including imaging, microbiology, ancillary and laboratory) are listed below for reference.     Microbiology: No results found for this or any previous visit (from the past 240 hour(s)).   Labs:  CBC: Recent Labs  Lab 05/16/22 0121 05/17/22 0201 05/18/22 0712 05/19/22 0101 05/20/22 0307  WBC 12.6* 6.5 7.3 6.9 8.5  NEUTROABS 11.4* 4.0 4.3 4.3 5.0  HGB 9.5* 9.5* 10.0* 9.4* 9.4*  HCT 29.9* 29.0* 29.9* 29.0* 28.9*  MCV 103.5* 101.4* 101.4* 102.8* 102.8*  PLT 226 171 182 183 244   BMP &GFR Recent Labs  Lab 05/16/22 0121 05/17/22 0201 05/18/22 0712 05/19/22 0101 05/20/22 0307  NA 133* 132* 135 137 139  K 5.5* 3.6 4.0 3.5 3.9  CL 96* 93* 96* 94* 100  CO2 21* '26 24 27 27  '$ GLUCOSE 144* 98 96 133* 124*  BUN 30* 15 27* 11 23  CREATININE 5.63* 3.11* 5.12* 2.92* 4.45*  CALCIUM 9.6 9.3 9.8 9.5 9.4  MG 2.2 1.8 1.9 1.7 1.8  PHOS 4.9* 2.7 3.1 1.9* 1.6*   Estimated Creatinine Clearance: 8.2 mL/min (A) (by C-G formula based on SCr of 4.45 mg/dL (H)). Liver & Pancreas: Recent Labs  Lab 05/16/22 0121 05/17/22 0201 05/18/22 0712 05/19/22 0101 05/20/22 0307  AST  --  '26 22 22 19  '$ ALT  --  <5 <5 <5 <5  ALKPHOS  --  89 77 84 93  BILITOT  --  0.5 0.8 0.6 0.6  PROT  --  6.7 6.9 6.6 6.8  ALBUMIN 3.0* 3.0* 2.9*  2.8* 2.9*   No results for input(s): "LIPASE", "AMYLASE" in the last 168 hours. No results for input(s): "AMMONIA" in the last 168 hours. Diabetic: No results for input(s): "HGBA1C" in the last 72 hours. No results for input(s): "GLUCAP" in the last 168 hours. Cardiac Enzymes: No results for input(s): "CKTOTAL", "CKMB", "CKMBINDEX", "TROPONINI" in the last 168 hours. No results for input(s): "PROBNP" in the last 8760 hours. Coagulation Profile: No results for input(s): "INR", "PROTIME" in the last 168 hours. Thyroid Function Tests: No results for input(s): "TSH", "T4TOTAL", "FREET4", "T3FREE", "THYROIDAB" in the last 72  hours. Lipid Profile: No results for input(s): "CHOL", "HDL", "LDLCALC", "TRIG", "CHOLHDL", "LDLDIRECT" in the last 72 hours. Anemia Panel: No results for input(s): "VITAMINB12", "FOLATE", "FERRITIN", "TIBC", "IRON", "RETICCTPCT" in the last 72 hours. Urine analysis:    Component Value Date/Time   COLORURINE YELLOW 01/30/2011 1648   APPEARANCEUR CLOUDY (A) 01/30/2011 1648   LABSPEC 1.016 01/30/2011 1648   PHURINE 5.0 01/30/2011 1648   GLUCOSEU NEGATIVE 10/14/2009 0747   HGBUR SMALL (A) 01/30/2011 1648   BILIRUBINUR NEGATIVE 01/30/2011 1648   KETONESUR NEGATIVE 01/30/2011 1648   PROTEINUR 100 (A) 01/30/2011 1648   UROBILINOGEN 0.2 01/30/2011 1648   NITRITE NEGATIVE 01/30/2011 1648   LEUKOCYTESUR SMALL (A) 01/30/2011 1648   Sepsis Labs: Invalid input(s): "PROCALCITONIN", "LACTICIDVEN"   Time coordinating discharge: 45 minutes  SIGNED:  Mercy Riding, MD  [facility] 05/21/2022, 12:13 PM

## 2022-05-21 NOTE — Progress Notes (Signed)
Physical Therapy Treatment Patient Details Name: [name] MRN: [id number] DOB: 08/11/1951 Today's Date: 05/21/2022   History of Present Illness Pt is a 71 y.o. female admitted 05/06/22 with SOB and infection. 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 S/p RUE AVG excision with revision 5/15. Course complicated by hypotension perioperatively; ETT 5/15-5/16. Workup for septic shock secondary to MRSA bacteremia. 6/2 pt s/p exploration of LUE graft. PMH includes ESRD (HD MWF), CKD, CHF, HTN, HLD.    PT Comments    PTA called back into room to assist NT to mobilize pt safely back to bed, RN unavailable at time of session. Pt able to perform sit<>stand x4 trials and static standing 1-2 mins in [place] with +1-2 assist for safety with max encouragement. She was dependent for peri-care after using BSC. Pt mildly confused, oriented to time/self but not location or situation intermittently. Pt agreeable to sit up in bed and eat dinner at end of session, needed assist to prepare tray/beverage and cut meat for pt safety as she was not able to do this on her own. She continues to benefit from PT services to progress toward functional mobility goals.   Recommendations for follow up therapy are one component of a multi-disciplinary discharge planning process, led by the attending physician.  Recommendations may be updated based on patient status, additional functional criteria and insurance authorization.  Follow Up Recommendations  Skilled nursing-short term rehab (<3 hours/day)     Assistance Recommended at Discharge Frequent or constant Supervision/Assistance  Patient can return home with the following A lot of help with bathing/dressing/bathroom;Assistance with cooking/housework;Direct supervision/assist for medications management;Direct supervision/assist for financial management;Assist for transportation;Help with stairs or ramp for  entrance;Two people to help with walking and/or transfers   Equipment Recommendations  None recommended by PT    Recommendations for Other Services OT consult     Precautions / Restrictions Precautions Precautions: Fall Precaution Comments: Enteric with bowel incont, BP Restrictions Weight Bearing Restrictions: No     Mobility  Bed Mobility Overal bed mobility: Needs Assistance Bed Mobility: Rolling, Sit to Sidelying Rolling: Min guard Sidelying to sit: Mod assist, HOB elevated     Sit to sidelying: Min assist General bed mobility comments: cues for body mechanics, pt able to assist with lifting BLE over side of bed.    Transfers Overall transfer level: Needs assistance Equipment used: Ambulation equipment used Transfers: Sit to/from Stand, Bed to chair/wheelchair/BSC Sit to Stand: Mod assist, From elevated surface           General transfer comment: modA to stand from recliner with air cushion to [place]; totalA from [employer] and BSC>air bed via [place] and pt able to stand x4 reps (x2 from stedy flaps and x2 from lower surfaces but needs modA for full hip extension each time as she initiates well but tends to posterior lean/trunk flexion in stance. Transfer via Lift Equipment: Stedy  Ambulation/Gait   Pre-gait activities: pt too fatigued to march/take steps after AM session and multiple STS/peri-care         Balance Overall balance assessment: Needs assistance Sitting-balance support: Feet supported, Bilateral upper extremity supported, Single extremity supported Sitting balance-Leahy Scale: Poor Sitting balance - Comments: Pt tending to lean to R side due to desire to return to supine, seeming decreased recall of soiled bed and plan for standing peri-care; variable min guard to minA for seated balance at EOB on air bed. Postural control: Right lateral lean, Posterior  lean Standing balance support: Bilateral upper extremity supported Standing balance-Leahy  Scale: Poor Standing balance comment: reliant on +1-2 external assist and BUE support for 1-2 minutes standing at a time.                            Cognition Arousal/Alertness: Awake/alert (sleeping upon PTA arrival but awoken with time then remained alert for eating lunch once back in bed) Behavior During Therapy: WFL for tasks assessed/performed, Impulsive Overall Cognitive Status: No family/caregiver present to determine baseline cognitive functioning                                 General Comments: Pt following 1-step commands well but impulsive at times, she is intermittently confused and not oriented to hospital mid-way through session, asking therapist something about pt's mother although pt's mother likely no longer living (pt adult children are her contacts on chart)        Exercises Other Exercises Other Exercises: STS x 4 reps Other Exercises: static standing in [place] 1-2 minutes ea x4 trials Other Exercises: seated BLE AROM: LAQ x 5 reps    General Comments General comments (skin integrity, edema, etc.): BP 115/76 reclined in chair prior mobility, pt reports no lightheadedness with standing trials; SpO2 and HR WFL.      Pertinent Vitals/Pain Pain Assessment Pain Assessment: Faces Faces Pain Scale: Hurts a little bit Pain Location: bottom Pain Descriptors / Indicators: Discomfort Pain Intervention(s): Monitored during session, Repositioned           PT Goals (current goals can now be found in the care plan section) Acute Rehab PT Goals Patient Stated Goal: to return to nursing home PT Goal Formulation: With patient Time For Goal Achievement: 05/22/22 Progress towards PT goals: Progressing toward goals    Frequency    Min 2X/week      PT Plan Current plan remains appropriate       AM-PAC PT "6 Clicks" Mobility   Outcome Measure  Help needed turning from your back to your side while in a flat bed without using bedrails?: A  Little Help needed moving from lying on your back to sitting on the side of a flat bed without using bedrails?: A Lot Help needed moving to and from a bed to a chair (including a wheelchair)?: Total Help needed standing up from a chair using your arms (e.g., wheelchair or bedside chair)?: A Lot Help needed to walk in hospital room?: Total Help needed climbing 3-5 steps with a railing? : Total 6 Click Score: 10    End of Session Equipment Utilized During Treatment: Gait belt Activity Tolerance: Patient tolerated treatment well;Patient limited by fatigue (mildly fatigued after mobility earlier in day) Patient left: in bed;with bed alarm set;with call bell/phone within reach (pt sitting up in chair posture in air bed to eat lunch) Nurse Communication: Mobility status;Other (comment) (freq loose stools, may benefit from rectal tube?) PT Visit Diagnosis: Unsteadiness on feet (R26.81);Other abnormalities of gait and mobility (R26.89);Muscle weakness (generalized) (M62.81);Difficulty in walking, not elsewhere classified (R26.2)     Time: 4098-1191 PT Time Calculation (min) (ACUTE ONLY): 32 min  Charges:  $Therapeutic Exercise: 8-22 mins $Therapeutic Activity: 8-22 mins                     Mairi Stagliano P., PTA Acute Rehabilitation Services Secure Chat Preferred 9a-5:30pm Office: [phone]  Dalaya Suppa M Medea Deines 05/21/2022, 2:18 PM

## 2022-05-21 NOTE — Progress Notes (Signed)
   VASCULAR SURGERY ASSESSMENT & PLAN:   END-STAGE RENAL DISEASE: The wound is taking minimal packing.  At this point I think she just needs some bacitracin and a Band-Aid over this area.  Her graft is still working well.  We will follow this as an outpatient.   SUBJECTIVE:   No complaints this morning.  PHYSICAL EXAM:   Vitals:   05/20/22 1433 05/20/22 1623 05/20/22 2325 05/21/22 0451  BP:  (!) 111/34 (!) 114/52 (!) 109/96  Pulse:  82 98 (!) 104  Resp: '19 18 18 18  '$ Temp:  (!) 97.5 F (36.4 C)  97.9 F (36.6 C)  TempSrc:  Oral  Oral  SpO2:  94% 95% 93%  Weight:      Height:       Her wound takes minimal packing.  LABS:   Lab Results  Component Value Date   WBC 8.5 05/20/2022   HGB 9.4 (L) 05/20/2022   HCT 28.9 (L) 05/20/2022   MCV 102.8 (H) 05/20/2022   PLT 244 05/20/2022   Lab Results  Component Value Date   CREATININE 4.45 (H) 05/20/2022   Lab Results  Component Value Date   INR 1.2 05/06/2022    PROBLEM LIST:    Principal Problem:   NSTEMI Active Problems:   End-stage renal disease on hemodialysis (HCC)   Malnutrition of moderate degree   C. difficile diarrhea   Hypotension   Acute on chronic diastolic CHF   Vascular graft infection  and MSSA bacteremia   MSSA bacteremia   Physical deconditioning   Dermatitis   CURRENT MEDS:    atorvastatin  80 mg Oral Daily   B-complex with vitamin C  1 tablet Oral Daily   And   folic acid  1 mg Oral Daily   Chlorhexidine Gluconate Cloth  6 each Topical Q0600   clopidogrel  75 mg Oral Daily   escitalopram  10 mg Oral Daily   famotidine  20 mg Oral Daily   feeding supplement  237 mL Oral BID BM   heparin injection (subcutaneous)  5,000 Units Subcutaneous Q8H   midodrine  10 mg Oral TID WC   sodium chloride flush  3 mL Intravenous Q12H   vancomycin  125 mg Oral QID   Followed by   Derrill Memo ON 05/22/2022] vancomycin  125 mg Oral BID    [name] Office: [phone] 05/21/2022

## 2022-05-21 NOTE — TOC Progression Note (Signed)
Transition of Care [facility]) - Progression Note    Patient Details  Name: [name] MRN: [id number] Date of Birth: Oct 31, 1951  Transition of Care [facility]) CM/SW [place], [place] Phone Number: 05/21/2022, 12:37 PM  Clinical Narrative:    CSW sent clinicals to [facility] to update insurance authorization. Still pending at this time. CSW left VM to update family. TOC will continue to follow for DC needs.   Expected Discharge Plan: [place] Barriers to Discharge: Continued Medical Work up, Ship broker  Expected Discharge Plan and Services Expected Discharge Plan: [place]       Living arrangements for the past 2 months: [place] Expected Discharge Date: 05/21/22                                     Social Determinants of Health (SDOH) Interventions    Readmission Risk Interventions    11/11/2021    3:49 PM 08/18/2021    3:01 PM  Readmission Risk Prevention Plan  Transportation Screening Complete Complete  Medication Review Press photographer) Complete Complete  PCP or Specialist appointment within 3-5 days of discharge Complete Complete  HRI or Home Care Consult Complete Complete  SW Recovery Care/Counseling Consult Complete Complete  Palliative Care Screening Not Applicable Not Applicable  Skilled Nursing Facility Complete Complete

## 2022-05-22 DIAGNOSIS — R197 Diarrhea, unspecified: Secondary | ICD-10-CM | POA: Diagnosis not present

## 2022-05-22 DIAGNOSIS — J9601 Acute respiratory failure with hypoxia: Secondary | ICD-10-CM | POA: Diagnosis not present

## 2022-05-22 DIAGNOSIS — R52 Pain, unspecified: Secondary | ICD-10-CM | POA: Diagnosis not present

## 2022-05-22 DIAGNOSIS — R5381 Other malaise: Secondary | ICD-10-CM | POA: Diagnosis not present

## 2022-05-22 DIAGNOSIS — I2510 Atherosclerotic heart disease of native coronary artery without angina pectoris: Secondary | ICD-10-CM | POA: Diagnosis not present

## 2022-05-22 DIAGNOSIS — A0472 Enterocolitis due to Clostridium difficile, not specified as recurrent: Secondary | ICD-10-CM | POA: Diagnosis not present

## 2022-05-22 DIAGNOSIS — E440 Moderate protein-calorie malnutrition: Secondary | ICD-10-CM | POA: Diagnosis not present

## 2022-05-22 DIAGNOSIS — R7309 Other abnormal glucose: Secondary | ICD-10-CM | POA: Diagnosis not present

## 2022-05-22 DIAGNOSIS — R262 Difficulty in walking, not elsewhere classified: Secondary | ICD-10-CM | POA: Diagnosis not present

## 2022-05-22 DIAGNOSIS — I5022 Chronic systolic (congestive) heart failure: Secondary | ICD-10-CM | POA: Diagnosis not present

## 2022-05-22 DIAGNOSIS — A4901 Methicillin susceptible Staphylococcus aureus infection, unspecified site: Secondary | ICD-10-CM | POA: Diagnosis not present

## 2022-05-22 DIAGNOSIS — R41841 Cognitive communication deficit: Secondary | ICD-10-CM | POA: Diagnosis not present

## 2022-05-22 DIAGNOSIS — D631 Anemia in chronic kidney disease: Secondary | ICD-10-CM | POA: Diagnosis not present

## 2022-05-22 DIAGNOSIS — I5033 Acute on chronic diastolic (congestive) heart failure: Secondary | ICD-10-CM | POA: Diagnosis not present

## 2022-05-22 DIAGNOSIS — K219 Gastro-esophageal reflux disease without esophagitis: Secondary | ICD-10-CM | POA: Diagnosis not present

## 2022-05-22 DIAGNOSIS — T8612 Kidney transplant failure: Secondary | ICD-10-CM | POA: Diagnosis not present

## 2022-05-22 DIAGNOSIS — L299 Pruritus, unspecified: Secondary | ICD-10-CM | POA: Diagnosis not present

## 2022-05-22 DIAGNOSIS — I120 Hypertensive chronic kidney disease with stage 5 chronic kidney disease or end stage renal disease: Secondary | ICD-10-CM | POA: Diagnosis not present

## 2022-05-22 DIAGNOSIS — M6281 Muscle weakness (generalized): Secondary | ICD-10-CM | POA: Diagnosis not present

## 2022-05-22 DIAGNOSIS — G4700 Insomnia, unspecified: Secondary | ICD-10-CM | POA: Diagnosis not present

## 2022-05-22 DIAGNOSIS — R7881 Bacteremia: Secondary | ICD-10-CM | POA: Diagnosis not present

## 2022-05-22 DIAGNOSIS — I10 Essential (primary) hypertension: Secondary | ICD-10-CM | POA: Diagnosis not present

## 2022-05-22 DIAGNOSIS — N250 Renal osteodystrophy: Secondary | ICD-10-CM | POA: Diagnosis not present

## 2022-05-22 DIAGNOSIS — E785 Hyperlipidemia, unspecified: Secondary | ICD-10-CM | POA: Diagnosis not present

## 2022-05-22 DIAGNOSIS — I214 Non-ST elevation (NSTEMI) myocardial infarction: Secondary | ICD-10-CM | POA: Diagnosis not present

## 2022-05-22 DIAGNOSIS — R279 Unspecified lack of coordination: Secondary | ICD-10-CM | POA: Diagnosis not present

## 2022-05-22 DIAGNOSIS — N2581 Secondary hyperparathyroidism of renal origin: Secondary | ICD-10-CM | POA: Diagnosis not present

## 2022-05-22 DIAGNOSIS — B9561 Methicillin susceptible Staphylococcus aureus infection as the cause of diseases classified elsewhere: Secondary | ICD-10-CM | POA: Diagnosis not present

## 2022-05-22 DIAGNOSIS — I959 Hypotension, unspecified: Secondary | ICD-10-CM | POA: Diagnosis not present

## 2022-05-22 DIAGNOSIS — Z992 Dependence on renal dialysis: Secondary | ICD-10-CM | POA: Diagnosis not present

## 2022-05-22 DIAGNOSIS — N186 End stage renal disease: Secondary | ICD-10-CM | POA: Diagnosis not present

## 2022-05-22 DIAGNOSIS — I509 Heart failure, unspecified: Secondary | ICD-10-CM | POA: Diagnosis not present

## 2022-05-22 LAB — CBC
HCT: 29.9 % — ABNORMAL LOW (ref 36.0–46.0)
Hemoglobin: 9.6 g/dL — ABNORMAL LOW (ref 12.0–15.0)
MCH: 33.2 pg (ref 26.0–34.0)
MCHC: 32.1 g/dL (ref 30.0–36.0)
MCV: 103.5 fL — ABNORMAL HIGH (ref 80.0–100.0)
Platelets: 277 10*3/uL (ref 150–400)
RBC: 2.89 MIL/uL — ABNORMAL LOW (ref 3.87–5.11)
RDW: 21 % — ABNORMAL HIGH (ref 11.5–15.5)
WBC: 7.2 10*3/uL (ref 4.0–10.5)
nRBC: 0.6 % — ABNORMAL HIGH (ref 0.0–0.2)

## 2022-05-22 LAB — RENAL FUNCTION PANEL
Albumin: 3 g/dL — ABNORMAL LOW (ref 3.5–5.0)
Anion gap: 15 (ref 5–15)
BUN: 30 mg/dL — ABNORMAL HIGH (ref 8–23)
CO2: 25 mmol/L (ref 22–32)
Calcium: 10.1 mg/dL (ref 8.9–10.3)
Chloride: 98 mmol/L (ref 98–111)
Creatinine, Ser: 4.87 mg/dL — ABNORMAL HIGH (ref 0.44–1.00)
GFR, Estimated: 9 mL/min — ABNORMAL LOW (ref >60)
Glucose, Bld: 99 mg/dL (ref 70–99)
Phosphorus: 3 mg/dL (ref 2.5–4.6)
Potassium: 5.1 mmol/L (ref 3.5–5.1)
Sodium: 138 mmol/L (ref 135–145)

## 2022-05-22 IMAGING — CR DG CHEST 2V
2 series · 2 of 2 positions shown · non-contrast
Comparison: Chest x-ray 10/05/2021.

CLINICAL DATA: 70-year-old female with history of shortness of
breath.

EXAM:
CHEST - 2 VIEW

[chest lat]
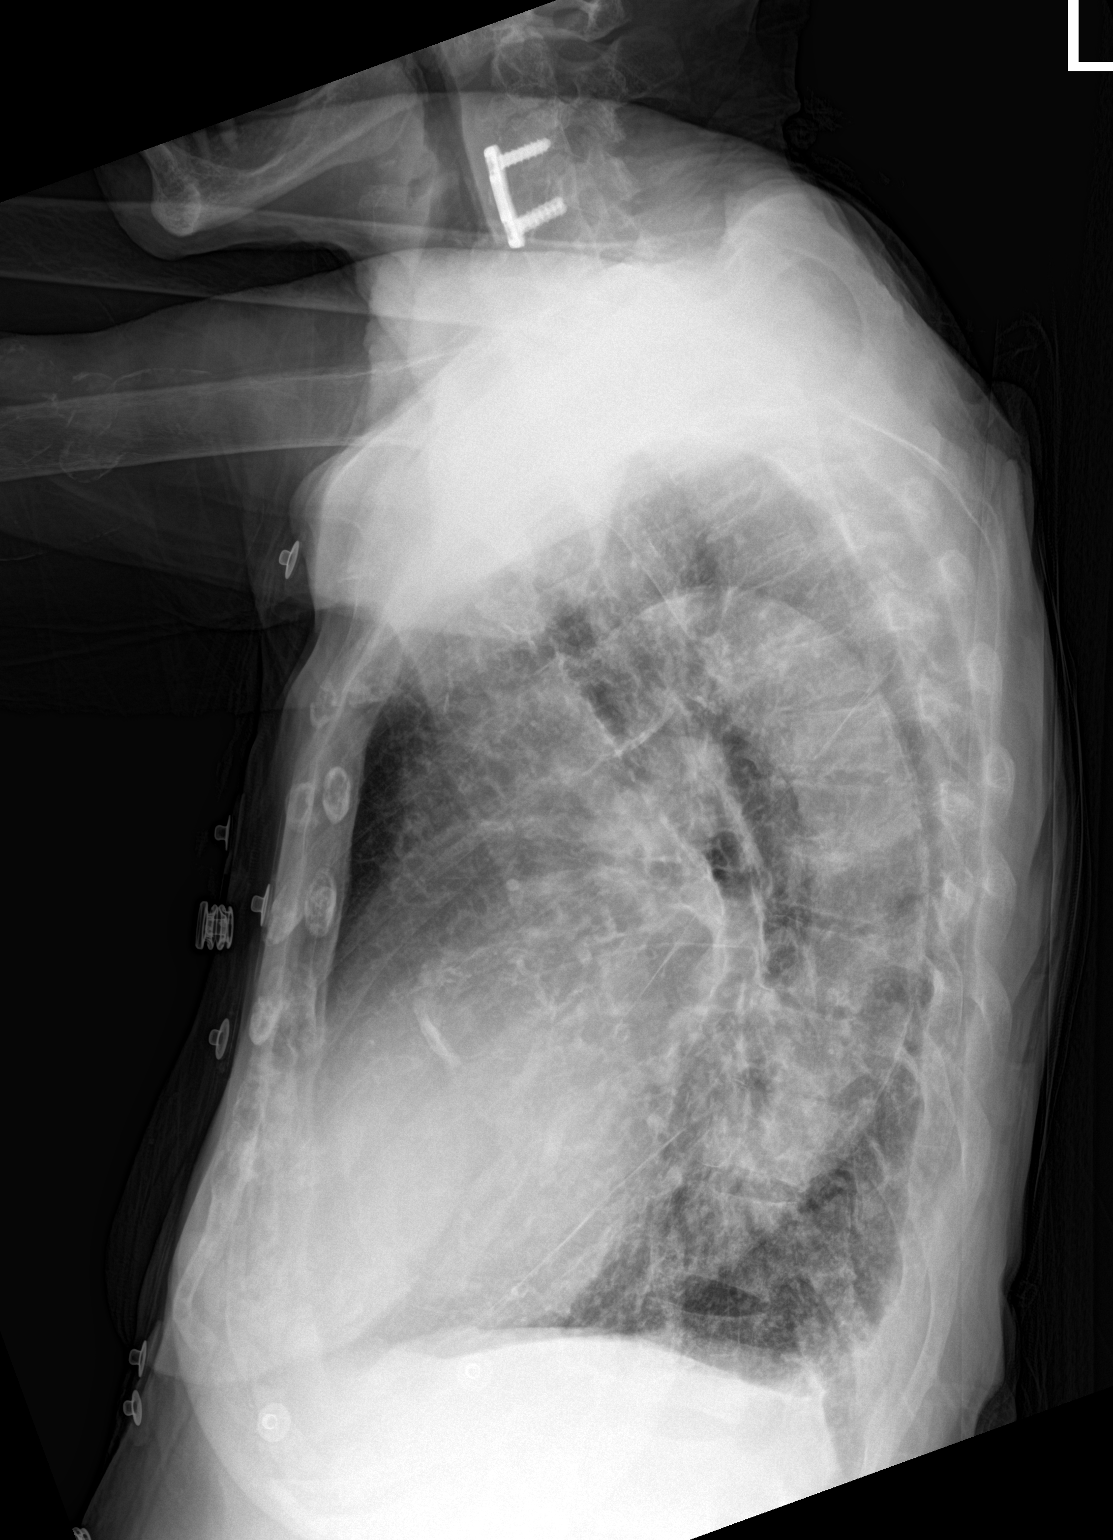

[chest ap]
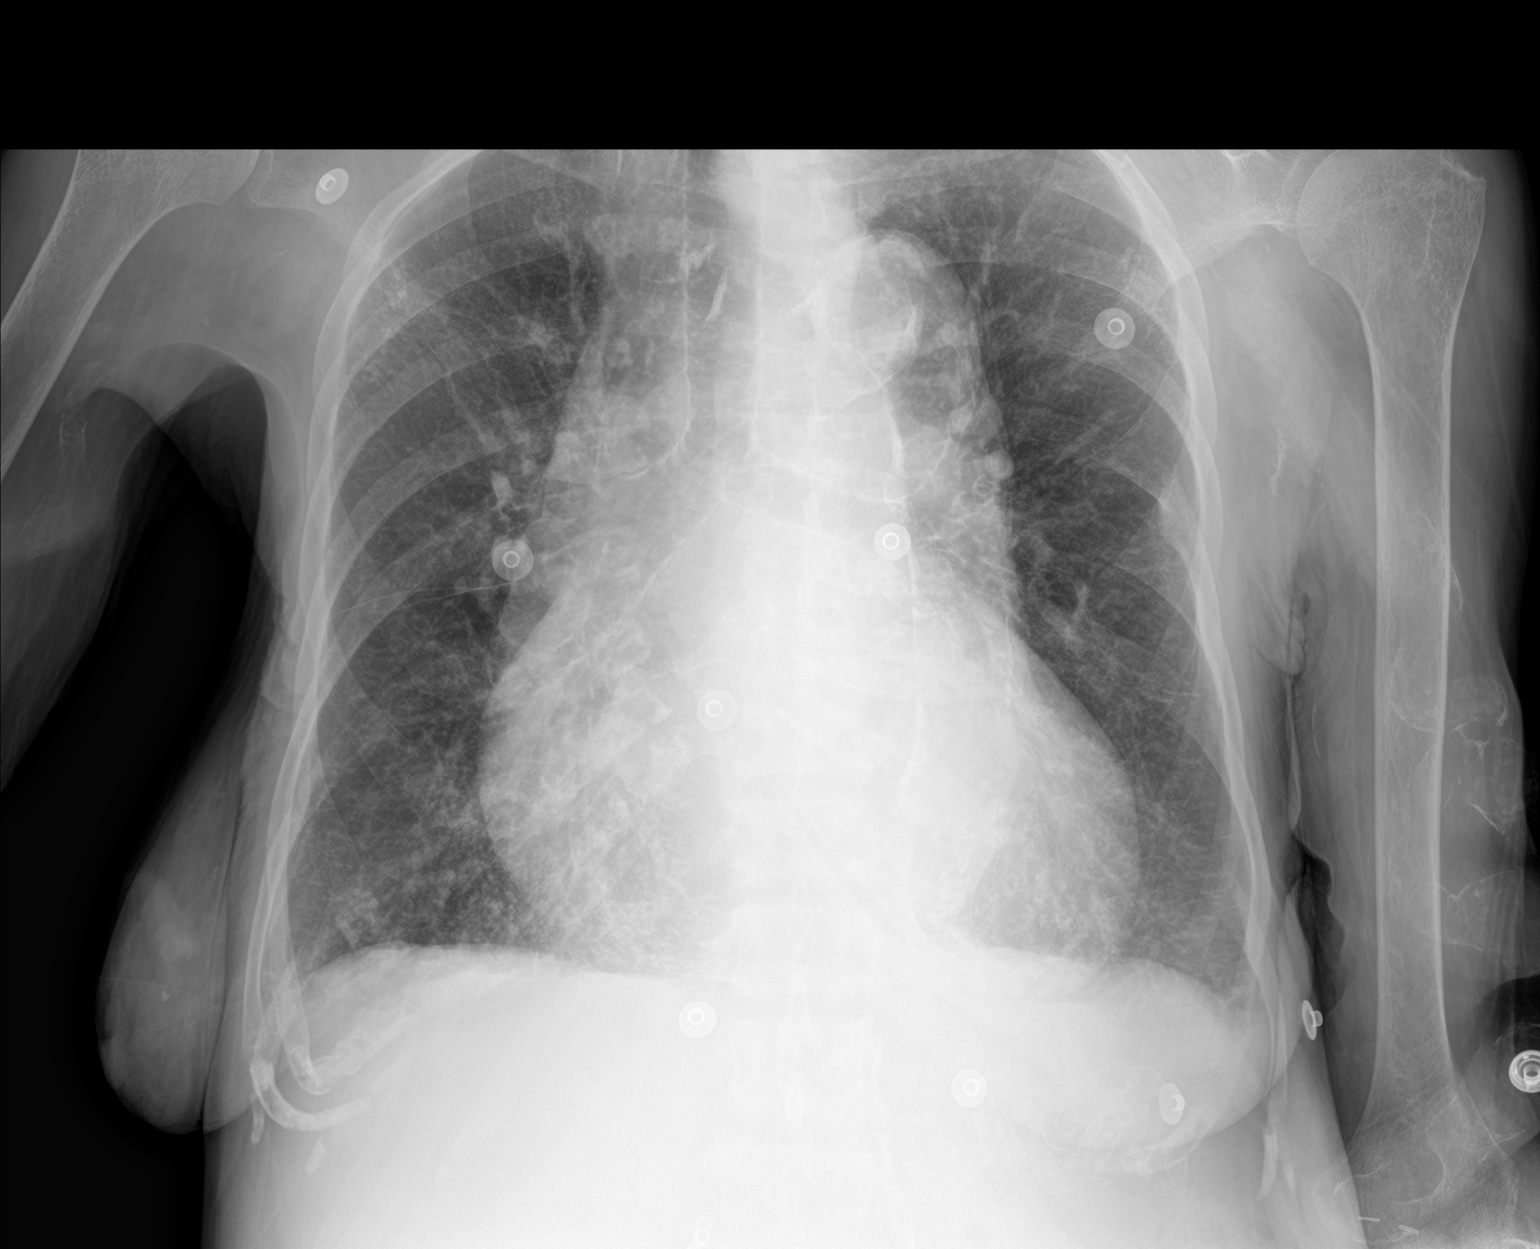

[2 of 2 positions shown; findings below may reference images not displayed]

FINDINGS: There is cephalization of the pulmonary vasculature and slight
indistinctness of the interstitial markings suggestive of mild
pulmonary edema. Small bilateral pleural effusions. No pneumothorax.
Moderate cardiomegaly. The patient is rotated to the right on
today's exam, resulting in distortion of the mediastinal contours
and reduced diagnostic sensitivity and specificity for mediastinal
pathology. Atherosclerotic calcifications in the thoracic aorta.
IMPRESSION: 1. The appearance the chest suggests mild congestive heart failure,
as above.
2. Aortic atherosclerosis.

## 2022-05-22 MED ORDER — ANTICOAGULANT SODIUM CITRATE 4% (200MG/5ML) IV SOLN: 5.0000 mL | Status: DC | PRN

## 2022-05-22 MED ORDER — LIDOCAINE-PRILOCAINE 2.5-2.5 % EX CREA: 1.0000 "application " | TOPICAL_CREAM | CUTANEOUS | Status: DC | PRN

## 2022-05-22 MED ORDER — HEPARIN SODIUM (PORCINE) 1000 UNIT/ML DIALYSIS: 1000.0000 [IU] | INTRAMUSCULAR | Status: DC | PRN

## 2022-05-22 MED ORDER — LIDOCAINE HCL (PF) 1 % IJ SOLN: 5.0000 mL | INTRAMUSCULAR | Status: DC | PRN

## 2022-05-22 MED ORDER — PENTAFLUOROPROP-TETRAFLUOROETH EX AERO
1.0000 | INHALATION_SPRAY | CUTANEOUS | Status: DC | PRN
Start: 2022-05-22 — End: 2022-05-22

## 2022-05-22 MED ORDER — ALTEPLASE 2 MG IJ SOLR
2.0000 mg | Freq: Once | INTRAMUSCULAR | Status: DC | PRN
Start: 2022-05-22 — End: 2022-05-22

## 2022-05-22 NOTE — Plan of Care (Signed)

## 2022-05-22 NOTE — TOC Transition Note (Signed)
Transition of Care [facility]) - CM/SW Discharge Note   Patient Details  Name: [name] MRN: [id number] Date of Birth: December 29, 1950  Transition of Care [facility]) CM/SW Contact:  Coralee Pesa, [place] Phone Number: 05/22/2022, 1:03 PM   Clinical Narrative:    Pt to be transported to [facility] via [place]. Nurse to call report to [phone]   Final next level of care: [place] Barriers to Discharge: Barriers Resolved   Patient Goals and CMS Choice Patient states their goals for this hospitalization and ongoing recovery are:: return to SNF      Discharge Placement              Patient chooses bed at:  [facility]) Patient to be transferred to facility by: [place] Name of family member notified: [name] Patient and family notified of of transfer: 05/22/22  Discharge Plan and Services                                     Social Determinants of Health (SDOH) Interventions     Readmission Risk Interventions    11/11/2021    3:49 PM 08/18/2021    3:01 PM  Readmission Risk Prevention Plan  Transportation Screening Complete Complete  Medication Review ([place]) Complete Complete  PCP or Specialist appointment within 3-5 days of discharge Complete Complete  HRI or Home Care Consult Complete Complete  SW Recovery Care/Counseling Consult Complete Complete  Palliative Care Screening Not Applicable Not Applicable  Skilled Nursing Facility Complete Complete

## 2022-05-22 NOTE — Discharge Summary (Signed)
Physician Discharge Summary  [name]:[id number] DOB: September 07, 1951 DOA: 05/06/2022  PCP: [name], MD  Admit date: 05/06/2022 Discharge date: 05/22/2022 Admitted From: SNF Disposition: SNF Recommendations for Outpatient Follow-up:  Follow-up with ID and vascular surgery per their recommendation. Check CBC and BMP in about a week Please follow up on the following pending results: None   Discharge Condition: Stable CODE STATUS: DNR/DNI   Hospital course [name] is a 71 yo female with PMH dCHF, ESRD on HD, CVA, CAD, HLD, HTN, GERD  and recent hospitalization from 5/14-5/22 with septic shock due to staph bacteremia from infected AV graft for which she was discharged on IV Ancef returning with shortness of breath and admitted with non-STEMI and fluid overload.  She was started on IV heparin.  Cardiology consulted.  She was deemed to be not a good interventional candidate.  Cardiology recommended medical management and palliative care consult.   Hospital stay stay complicated by wound dehiscence adjacent to L arm graft, now s/p exploration of L arm graft and evacuation of hematoma by vascular on 6/2.  She was on cefepime and vancomycin for MSSA bacteremia and left arm graft wound.  However, she developed C. Difficile, and antibiotics de-escalated to Ancef.  She was started on oral vancomycin for C. difficile on 05/06/2022.  ID recommended oral vancomycin for a total of 14 days followed by twice daily for 7 days after completion of IV Ancef which would be on 05/26/2022.  She is discharged on IV Ancef with dialysis through 05/26/2022.  She will be on p.o. doxycycline 100 mg twice daily after completing IV Ancef for suppressive therapy.  She is more or less stabilized for discharge to SNF as of 05/20/2022.   See individual problem list below for more on hospital course.  Problems addressed during this hospitalization Problem  NSTEMI  Acute on chronic diastolic CHF  Vascular graft  infection  and MSSA bacteremia  Hypotension  C. Difficile Diarrhea  End-Stage Renal Disease On Hemodialysis (Hcc)  Dermatitis  Physical Deconditioning  Malnutrition of moderate degree  Insomnia    Assessment and Plan: * NSTEMI Deemed to be not a good interventional candidate by cardiology Cardiology recommended medical management and palliative care consult. Patient is DNR/DNI.  May benefit from palliative follow-up outpatient She is currently chest pain-free.  Acute on chronic diastolic CHF No cardiopulmonary symptoms this morning.  Fluid management with dialysis  Vascular graft infection  and MSSA bacteremia Plan for Ancef post HD MWF until 6/13, then oral doxy 100 mg BID indefinitely  S/p exploration of L arm graft and evacuation of hematoma on 6/2 for wound dehiscence  Per vascular surgery, bacitracin and Band-Aid, and outpatient follow-up.   Vascular surgery to arrange outpatient follow-up  Hypotension Remains hypotensive but improved. -Continue midodrine   C. difficile diarrhea Diarrhea seems to have improved Continue oral vancomycin 125 mg 4 times daily for a total of 14 days from 05/06/2022 followed by twice daily through 06/02/2022.  End-stage renal disease on hemodialysis [facility]) Per nephrology  Dermatitis Skin breakdown from ongoing diarrhea.  Diarrhea seems to be resolving. Continue Desitin cream  Physical deconditioning Continue therapy  Malnutrition of moderate degree Nutrition Status: Body mass index is 18.15 kg/m. Multivitamin and Nepro Could benefit from dietitian follow-up at SNF.  Insomnia Decreased Ambien to 5 mg daily as needed   Pressure skin injury: POA Pressure Injury 10/27/21 Coccyx Mid Stage 1 -  Intact skin with non-blanchable redness of a localized area usually over  a bony prominence. (Active)  10/27/21 2200  Location: Coccyx  Location Orientation: Mid  Staging: Stage 1 -  Intact skin with non-blanchable redness of a localized area  usually over a bony prominence.  Wound Description (Comments):   Present on Admission: Yes    Vital signs Vitals:   05/22/22 0920 05/22/22 0930 05/22/22 1000 05/22/22 1030  BP: (!) 120/48 (!) 120/46 (!) 120/46 (!) 121/43  Pulse: 79 79 81 75  Temp:      Resp: '14 13 14 14  '$ Height:      Weight:      SpO2: 97%  98%   TempSrc:      BMI (Calculated):         Discharge exam  GENERAL: Appears frail.  No distress. HEENT: MMM.  Vision and hearing grossly intact.  NECK: Supple.  No apparent JVD.  RESP:  No IWOB.  Fair aeration bilaterally. CVS:  RRR. Heart sounds normal.  ABD/GI/GU: BS+. Abd soft, NTND.  MSK/EXT:  Moves extremities.  Significant muscle mass and subcu fat loss. SKIN: no apparent skin lesion or wound NEURO: Sleepy but wakes to voice.  Fairly oriented.  No apparent focal neuro deficit. PSYCH: Calm. Normal affect.   Discharge Instructions  Allergies as of 05/22/2022       Reactions   Sulfa Antibiotics Other (See Comments)   Per patient, both parents allergic-so will not take   Adhesive [tape] Itching        Medication List     STOP taking these medications    cephALEXin 500 MG capsule Commonly known as: KEFLEX   loperamide 2 MG capsule Commonly known as: IMODIUM   polyethylene glycol 17 g packet Commonly known as: MIRALAX / GLYCOLAX       TAKE these medications    acetaminophen 500 MG tablet Commonly known as: TYLENOL Take 1 tablet (500 mg total) by mouth every 6 (six) hours as needed. What changed: reasons to take this   albuterol 108 (90 Base) MCG/ACT inhaler Commonly known as: VENTOLIN HFA Inhale 1-2 puffs into the lungs every 6 (six) hours as needed for wheezing or shortness of breath. What changed:  how much to take reasons to take this   albuterol (2.5 MG/3ML) 0.083% nebulizer solution Commonly known as: PROVENTIL Take 3 mLs (2.5 mg total) by nebulization every 4 (four) hours as needed for wheezing. What changed: when to take this    atorvastatin 80 MG tablet Commonly known as: LIPITOR Take 80 mg by mouth at bedtime.   ceFAZolin 2-4 GM/100ML-% IVPB Commonly known as: ANCEF Inject 100 mLs (2 g total) into the vein every Monday, Wednesday, and Friday at 8 PM for 22 days. Patient will receive this after her HD at HD center.   clopidogrel 75 MG tablet Commonly known as: Plavix Take 1 tablet (75 mg total) by mouth daily. What changed: when to take this   Dialyvite 800 0.8 MG Wafr Take 1 tablet by mouth every morning.   doxycycline 100 MG capsule Commonly known as: VIBRAMYCIN Take 1 capsule (100 mg total) by mouth 2 (two) times daily. Start taking on: May 27, 2022   escitalopram 10 MG tablet Commonly known as: LEXAPRO Take 10 mg by mouth every morning.   hydrOXYzine 25 MG tablet Commonly known as: ATARAX Take 1 tablet (25 mg total) by mouth every 8 (eight) hours as needed for itching.   midodrine 10 MG tablet Commonly known as: PROAMATINE Take 1 tablet (10 mg total) by mouth 3 (  three) times daily with meals. What changed:  medication strength how much to take   Nepro Liqd Take 237 mLs by mouth 2 (two) times daily between meals. What changed: Another medication with the same name was removed. Continue taking this medication, and follow the directions you see here.   ondansetron 8 MG tablet Commonly known as: ZOFRAN Take 8 mg by mouth every 12 (twelve) hours as needed for nausea or vomiting.   OVER THE COUNTER MEDICATION Take 1 tablet by mouth every morning. Folic acid - vitamin B complex - vitamin C - selenium - zinc 3 mg   pantoprazole 40 MG tablet Commonly known as: PROTONIX Take 1 tablet (40 mg total) by mouth daily.   sevelamer 800 MG tablet Commonly known as: RENAGEL Take 1,600 mg by mouth 3 (three) times daily with meals.   vancomycin 125 MG capsule Commonly known as: VANCOCIN Take 1 capsule (125 mg total) by mouth 4 (four) times daily for 2 days, THEN 1 capsule (125 mg total) in the  morning and at bedtime for 10 days. Start taking on: May 21, 2022   zolpidem 5 MG tablet Commonly known as: AMBIEN Take 1 tablet (5 mg total) by mouth at bedtime as needed for up to 10 days for sleep (Insomnia). What changed:  medication strength how much to take when to take this reasons to take this         Consultations: Vascular surgery Nephrology Infectious disease  Procedures/Studies: 6/2-left arm graft exploration and evacuation of hematoma   CT HEAD WO CONTRAST (5MM)  Result Date: 05/20/2022 CLINICAL DATA:  Head trauma EXAM: CT HEAD WITHOUT CONTRAST TECHNIQUE: Contiguous axial images were obtained from the base of the skull through the vertex without intravenous contrast. RADIATION DOSE REDUCTION: This exam was performed according to the departmental dose-optimization program which includes automated exposure control, adjustment of the mA and/or kV according to patient size and/or use of iterative reconstruction technique. COMPARISON:  CT brain 11/07/2021 FINDINGS: Brain: No acute territorial infarction, hemorrhage or intracranial mass. Advanced atrophy. Advanced chronic small vessel ischemic changes of the white matter. Stable ventricle size Vascular: No hyperdense vessels. Vertebral and carotid vascular calcification. Skull: Normal. Negative for fracture or focal lesion. Sinuses/Orbits: No acute finding. Other: Increased atlantal axial interval up to 7 mm but appears chronic as compared with November CT. This could be secondary to inflammatory arthropathy given inflammatory changes at the tip of the dens. IMPRESSION: 1. No CT evidence for acute intracranial abnormality. 2. Atrophy and chronic small vessel ischemic changes of the white matter Electronically Signed   By: Donavan Foil M.D.   On: 05/20/2022 01:40   DG Abd 1 View  Result Date: 05/18/2022 CLINICAL DATA:  71 year old female with abdominal pain. EXAM: ABDOMEN - 1 VIEW COMPARISON:  CT abdomen pelvis from 01/23/2022  FINDINGS: The bowel gas pattern is normal. Extensive atherosclerotic calcifications. Surgical clips in the right upper quadrant. IMPRESSION: 1. Nonobstructive bowel gas pattern. 2.  Aortic Atherosclerosis (ICD10-I70.0). Electronically Signed   By: Ruthann Cancer M.D.   On: 05/18/2022 11:47   DG CHEST PORT 1 VIEW  Result Date: 05/14/2022 CLINICAL DATA:  Shortness of breath Chest pain EXAM: PORTABLE CHEST - 1 VIEW COMPARISON:  05/06/2022 FINDINGS: Unchanged cardiomegaly. Interval improvement of pulmonary vascular congestion. Diffusely calcified thoracic aorta. The lungs are clear. IMPRESSION: 1. Unchanged cardiomegaly. 2. Interval improvement of pulmonary vascular congestion. Electronically Signed   By: Miachel Roux M.D.   On: 05/14/2022 11:13   DG Chest  Port 1 View  Result Date: 05/06/2022 CLINICAL DATA:  Shortness of breath EXAM: PORTABLE CHEST 1 VIEW COMPARISON:  Nine days ago FINDINGS: Cardiomegaly and vascular pedicle widening with diffuse interstitial coarsening including Kerley lines. No visible effusion or pneumothorax. Artifact from EKG leads. IMPRESSION: CHF pattern. Electronically Signed   By: Jorje Guild M.D.   On: 05/06/2022 07:11   ECHOCARDIOGRAM LIMITED  Result Date: 04/28/2022    ECHOCARDIOGRAM LIMITED REPORT   Patient Name:   JERRE DIGUGLIELMO Date of Exam: 04/28/2022 Medical Rec #:  [id number]         Height:       62.0 in Accession #:    [id number]        Weight:       101.0 lb Date of Birth:  Nov 06, 1951         BSA:          1.430 m Patient Age:    89 years          BP:           106/31 mmHg Patient Gender: F                 HR:           64 bpm. Exam Location:  Inpatient Procedure: Cardiac Doppler, Color Doppler and Limited Echo Indications:    Bacteremia  History:        Patient has prior history of Echocardiogram examinations, most                 recent 02/22/2021. CHF; Risk Factors:Hypertension.  Sonographer:    Joette Catching RCS Referring Phys: [id number] Coin  1.  No left ventricular thrombus is seen. Left ventricular ejection fraction, by estimation, is 50 to 55%. The left ventricle has low normal function. The left ventricle demonstrates regional wall motion abnormalities (see scoring diagram/findings for description). There is moderate hypokinesis of the left ventricular, entire inferolateral wall.  2. The right ventricular size is moderately enlarged. There is severely elevated pulmonary artery systolic pressure.  3. Left atrial size was moderately dilated.  4. Right atrial size was moderately dilated.  5. Mild to moderate mitral valve regurgitation. No evidence of mitral stenosis.  6. Tricuspid valve regurgitation is moderate to severe.  7. The aortic valve is tricuspid. There is moderate calcification of the aortic valve. There is moderate thickening of the aortic valve. Aortic valve regurgitation is trivial. Aortic valve sclerosis/calcification is present, without any evidence of aortic stenosis.  8. The inferior vena cava is dilated in size with <50% respiratory variability, suggesting right atrial pressure of 15 mmHg. Conclusion(s)/Recommendation(s): No evidence of valvular vegetations on this transthoracic echocardiogram. Consider a transesophageal echocardiogram to exclude infective endocarditis if clinically indicated. FINDINGS  Left Ventricle: No left ventricular thrombus is seen. Left ventricular ejection fraction, by estimation, is 50 to 55%. The left ventricle has low normal function. The left ventricle demonstrates regional wall motion abnormalities. Moderate hypokinesis of the left ventricular, entire inferolateral wall. The left ventricular internal cavity size was normal in size. There is no left ventricular hypertrophy. Right Ventricle: The right ventricular size is moderately enlarged. There is severely elevated pulmonary artery systolic pressure. The tricuspid regurgitant velocity is 3.69 m/s, and with an assumed right atrial pressure of 15 mmHg, the  estimated right ventricular systolic pressure is 10.6 mmHg. Left Atrium: Left atrial size was moderately dilated. Right Atrium: Right atrial size was moderately dilated. Pericardium: There is no evidence  of pericardial effusion. Mitral Valve: Mild to moderate mitral annular calcification. Mild to moderate mitral valve regurgitation, with centrally-directed jet. No evidence of mitral valve stenosis. Tricuspid Valve: The tricuspid valve is normal in structure. Tricuspid valve regurgitation is moderate to severe. Aortic Valve: The aortic valve is tricuspid. There is moderate calcification of the aortic valve. There is moderate thickening of the aortic valve. Aortic valve regurgitation is trivial. Aortic regurgitation PHT measures 585 msec. Aortic valve sclerosis/calcification is present, without any evidence of aortic stenosis. Aortic valve peak gradient measures 13.7 mmHg. Pulmonic Valve: The pulmonic valve was not well visualized. Pulmonic valve regurgitation is not visualized. Venous: The inferior vena cava is dilated in size with less than 50% respiratory variability, suggesting right atrial pressure of 15 mmHg. The inferior vena cava and the hepatic vein show a pattern of systolic flow reversal, suggestive of tricuspid regurgitation. IAS/Shunts: No atrial level shunt detected by color flow Doppler. LEFT VENTRICLE PLAX 2D LVIDd:         4.97 cm LVIDs:         3.68 cm LV PW:         0.80 cm LV IVS:        0.70 cm  LV Volumes (MOD) LV vol d, MOD A2C: 126.0 ml LV vol d, MOD A4C: 76.1 ml LV vol s, MOD A2C: 69.1 ml LV vol s, MOD A4C: 35.6 ml LV SV MOD A2C:     56.9 ml LV SV MOD A4C:     76.1 ml LV SV MOD BP:      50.1 ml RIGHT VENTRICLE TAPSE (M-mode): 2.3 cm AORTIC VALVE AV Vmax:      185.00 cm/s AV Peak Grad: 13.7 mmHg LVOT Vmax:    113.00 cm/s AI PHT:       585 msec MR Peak grad:   131.3 mmHg   TRICUSPID VALVE MR Vmax:        573.00 cm/s  TR Peak grad:   54.5 mmHg MR PISA:        1.01 cm     TR Vmax:        369.00  cm/s MR PISA Radius: 0.40 cm Dani Gobble Croitoru MD Electronically signed by Sanda Klein MD Signature Date/Time: 04/28/2022/12:56:08 PM    Final    DG CHEST PORT 1 VIEW  Result Date: 04/27/2022 CLINICAL DATA:  Central line placement. EXAM: PORTABLE CHEST 1 VIEW COMPARISON:  Radiographs dated Apr 26, 2022 FINDINGS: The heart is enlarged. Atherosclerotic calcification of the aortic arch. Mild pulmonary vascular congestion. Lungs are clear without evidence of focal consolidation or large pleural effusion. Endotracheal tube with distal tip approximately 5 cm above the carina. Left IJ access central line with distal tip at the junction of the SVC and innominate vein. IMPRESSION: 1. Stable cardiomegaly with mild pulmonary vascular congestion. 2. No focal consolidation or large pleural effusion. 3. Left IJ access central line with distal tip at the junction of the SVC and innominate vein. Electronically Signed   By: Keane Police D.O.   On: 04/27/2022 16:50   CT CHEST WO CONTRAST  Result Date: 04/26/2022 CLINICAL DATA:  Pneumonia, short of breath, hypoxia EXAM: CT CHEST WITHOUT CONTRAST TECHNIQUE: Multidetector CT imaging of the chest was performed following the standard protocol without IV contrast. RADIATION DOSE REDUCTION: This exam was performed according to the departmental dose-optimization program which includes automated exposure control, adjustment of the mA and/or kV according to patient size and/or use of iterative reconstruction technique. COMPARISON:  11/20/2019  FINDINGS: Cardiovascular: Unenhanced imaging of the heart demonstrates cardiomegaly without significant pericardial effusion. 4.1 cm ascending thoracic aortic aneurysm is unchanged. Evaluation of the vascular lumen is limited without IV contrast. There is extensive atherosclerosis of the aorta and coronary vasculature. Mediastinum/Nodes: No enlarged mediastinal or axillary lymph nodes. Thyroid gland, trachea, and esophagus demonstrate no  significant findings. Lungs/Pleura: Trace right pleural effusion. Hypoventilatory changes are seen at the lung bases, right greater than left. Chronic areas of subpleural scarring are seen bilaterally. Likely aspirated barium within the dependent aspects of the lower lobes, right greater than left, stable. No pneumothorax. Central airways are patent. Upper Abdomen: No acute abnormality.  Severe atherosclerosis. Musculoskeletal: Diffuse changes of renal osteodystrophy. No acute or destructive bony lesions. Patient is cachectic. Reconstructed images demonstrate no additional findings. IMPRESSION: 1. Trace right pleural effusion. 2. Minimal dependent atelectasis within the lower lobes. No acute airspace disease. 3. Stable cardiomegaly without significant pericardial effusion. 4. 4.1 cm ascending thoracic aortic aneurysm, stable. Recommend annual imaging followup by CTA or MRA. This recommendation follows 2010 ACCF/AHA/AATS/ACR/ASA/SCA/SCAI/SIR/STS/SVM Guidelines for the Diagnosis and Management of Patients with Thoracic Aortic Disease. Circulation. 2010; 121: W413-K440. Aortic aneurysm NOS (ICD10-I71.9) 5.  Aortic Atherosclerosis (ICD10-I70.0). Electronically Signed   By: Randa Ngo M.D.   On: 04/26/2022 23:21   DG Chest Port 1 View  Result Date: 04/26/2022 CLINICAL DATA:  Short of breath, hypoxia EXAM: PORTABLE CHEST 1 VIEW COMPARISON:  02/08/2022 FINDINGS: Single frontal view of the chest demonstrate stable marked enlargement of the cardiac silhouette. Stable ectasia and atherosclerosis of the thoracic aorta. Chronic central vascular congestion without acute airspace disease, effusion, or pneumothorax. No acute bony abnormality. IMPRESSION: 1. Chronic cardiomegaly and central vascular congestion. No acute airspace disease. Electronically Signed   By: Randa Ngo M.D.   On: 04/26/2022 20:19       The results of significant diagnostics from this hospitalization (including imaging, microbiology,  ancillary and laboratory) are listed below for reference.     Microbiology: No results found for this or any previous visit (from the past 240 hour(s)).   Labs:  CBC: Recent Labs  Lab 05/16/22 0121 05/17/22 0201 05/18/22 0712 05/19/22 0101 05/20/22 0307 05/22/22 0715  WBC 12.6* 6.5 7.3 6.9 8.5 7.2  NEUTROABS 11.4* 4.0 4.3 4.3 5.0  --   HGB 9.5* 9.5* 10.0* 9.4* 9.4* 9.6*  HCT 29.9* 29.0* 29.9* 29.0* 28.9* 29.9*  MCV 103.5* 101.4* 101.4* 102.8* 102.8* 103.5*  PLT 226 171 182 183 244 277   BMP &GFR Recent Labs  Lab 05/16/22 0121 05/17/22 0201 05/18/22 0712 05/19/22 0101 05/20/22 0307 05/22/22 0714  NA 133* 132* 135 137 139 138  K 5.5* 3.6 4.0 3.5 3.9 5.1  CL 96* 93* 96* 94* 100 98  CO2 21* '26 24 27 27 25  '$ GLUCOSE 144* 98 96 133* 124* 99  BUN 30* 15 27* 11 23 30*  CREATININE 5.63* 3.11* 5.12* 2.92* 4.45* 4.87*  CALCIUM 9.6 9.3 9.8 9.5 9.4 10.1  MG 2.2 1.8 1.9 1.7 1.8  --   PHOS 4.9* 2.7 3.1 1.9* 1.6* 3.0   Estimated Creatinine Clearance: 7.5 mL/min (A) (by C-G formula based on SCr of 4.87 mg/dL (H)). Liver & Pancreas: Recent Labs  Lab 05/17/22 0201 05/18/22 0712 05/19/22 0101 05/20/22 0307 05/22/22 0714  AST '26 22 22 19  '$ --   ALT <5 <5 <5 <5  --   ALKPHOS 89 77 84 93  --   BILITOT 0.5 0.8 0.6 0.6  --  PROT 6.7 6.9 6.6 6.8  --   ALBUMIN 3.0* 2.9* 2.8* 2.9* 3.0*   No results for input(s): "LIPASE", "AMYLASE" in the last 168 hours. No results for input(s): "AMMONIA" in the last 168 hours. Diabetic: No results for input(s): "HGBA1C" in the last 72 hours. No results for input(s): "GLUCAP" in the last 168 hours. Cardiac Enzymes: No results for input(s): "CKTOTAL", "CKMB", "CKMBINDEX", "TROPONINI" in the last 168 hours. No results for input(s): "PROBNP" in the last 8760 hours. Coagulation Profile: No results for input(s): "INR", "PROTIME" in the last 168 hours. Thyroid Function Tests: No results for input(s): "TSH", "T4TOTAL", "FREET4", "T3FREE",  "THYROIDAB" in the last 72 hours. Lipid Profile: No results for input(s): "CHOL", "HDL", "LDLCALC", "TRIG", "CHOLHDL", "LDLDIRECT" in the last 72 hours. Anemia Panel: No results for input(s): "VITAMINB12", "FOLATE", "FERRITIN", "TIBC", "IRON", "RETICCTPCT" in the last 72 hours. Urine analysis:    Component Value Date/Time   COLORURINE YELLOW 01/30/2011 1648   APPEARANCEUR CLOUDY (A) 01/30/2011 1648   LABSPEC 1.016 01/30/2011 1648   PHURINE 5.0 01/30/2011 1648   GLUCOSEU NEGATIVE 10/14/2009 0747   HGBUR SMALL (A) 01/30/2011 1648   BILIRUBINUR NEGATIVE 01/30/2011 1648   KETONESUR NEGATIVE 01/30/2011 1648   PROTEINUR 100 (A) 01/30/2011 1648   UROBILINOGEN 0.2 01/30/2011 1648   NITRITE NEGATIVE 01/30/2011 1648   LEUKOCYTESUR SMALL (A) 01/30/2011 1648   Sepsis Labs: Invalid input(s): "PROCALCITONIN", "LACTICIDVEN"   Time coordinating discharge: 45 minutes  SIGNED:  Mercy Riding, MD  [facility] 05/22/2022, 11:07 AM

## 2022-05-22 NOTE — Procedures (Signed)
Patient seen on Hemodialysis. BP (!) 120/48   Pulse 79   Temp 97.6 F (36.4 C) (Oral)   Resp 14   Ht '5\' 2"'$  (1.575 m)   Wt 45 kg   SpO2 97%   BMI 18.15 kg/m   QB 400, UF goal 515m Tolerating treatment without complaints at this time. Discharge to skilled nursing facility anticipated, awaiting insurance confirmation/approval  JElmarie ShileyMD [facility] Office # [phone] # [phone] AM

## 2022-05-25 DIAGNOSIS — N186 End stage renal disease: Secondary | ICD-10-CM | POA: Diagnosis not present

## 2022-05-25 DIAGNOSIS — Z992 Dependence on renal dialysis: Secondary | ICD-10-CM | POA: Diagnosis not present

## 2022-05-25 DIAGNOSIS — A4901 Methicillin susceptible Staphylococcus aureus infection, unspecified site: Secondary | ICD-10-CM | POA: Diagnosis not present

## 2022-05-25 DIAGNOSIS — N2581 Secondary hyperparathyroidism of renal origin: Secondary | ICD-10-CM | POA: Diagnosis not present

## 2022-05-25 DIAGNOSIS — D631 Anemia in chronic kidney disease: Secondary | ICD-10-CM | POA: Diagnosis not present

## 2022-05-25 DIAGNOSIS — R52 Pain, unspecified: Secondary | ICD-10-CM | POA: Diagnosis not present

## 2022-05-26 IMAGING — CT CT ABD-PELV W/ CM
2 of 5 series · 16 of 46 positions shown, 18 images · IV contrast (Omni 300)
Comparison: CT abdomen and pelvis 08/10/2021

CLINICAL DATA: Abdominal pain

EXAM:
CT ABDOMEN AND PELVIS WITH CONTRAST
TECHNIQUE: Multidetector CT imaging of the abdomen and pelvis was performed
using the standard protocol following bolus administration of
intravenous contrast.
CONTRAST:  80mL OMNIPAQUE IOHEXOL 350 MG/ML SOLN

[Series 3: a/p w/ 5mm · axial · 0.70mm/px · z∈[+840,+1180]mm · 13 of 78 slices shown, 15 images]
[im 5/78  soft-tissue]
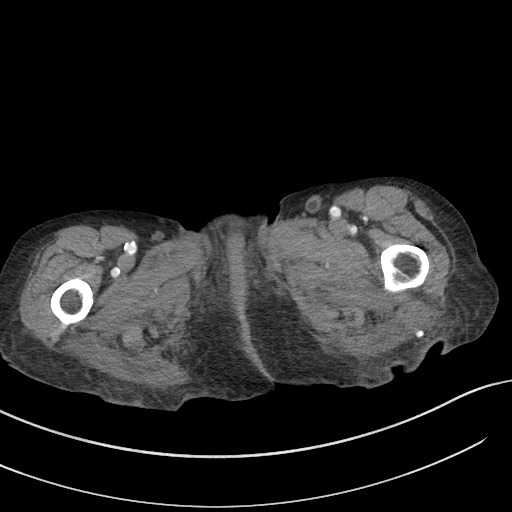
[im 5/78  bone]
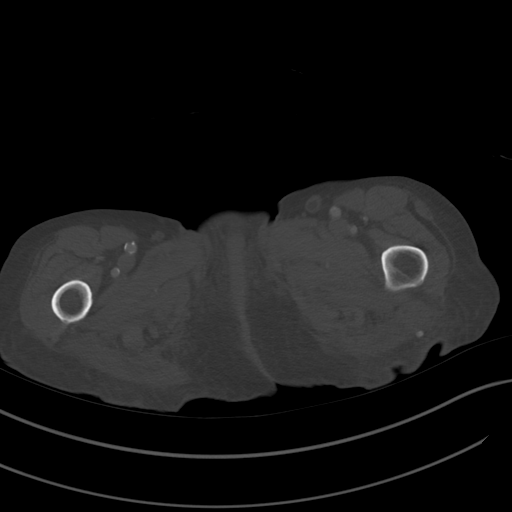
[im 13/78  soft-tissue]
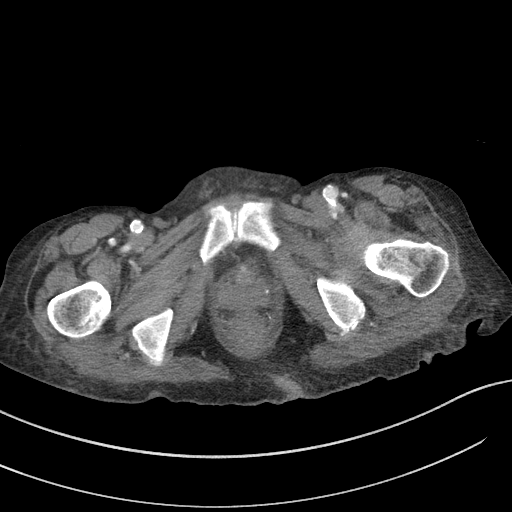
[im 17/78  soft-tissue]
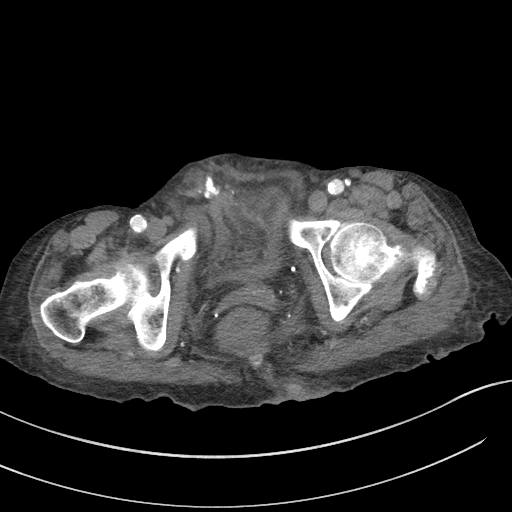
[im 21/78  soft-tissue]
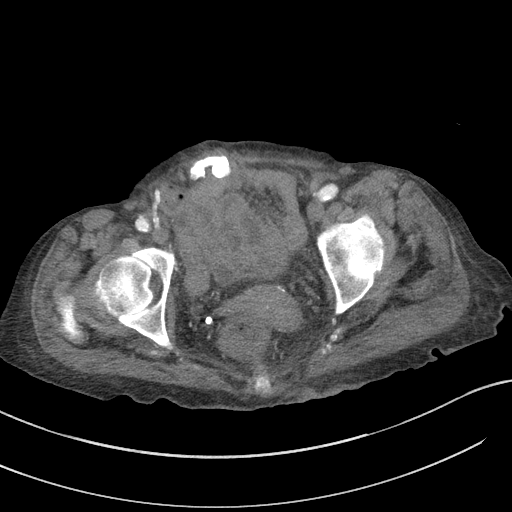
[im 29/78  soft-tissue]
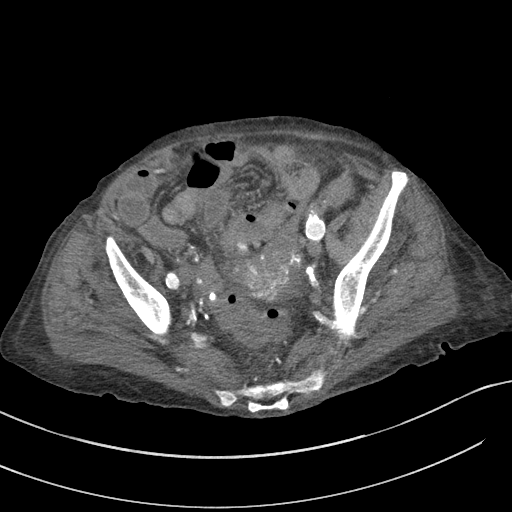
[im 33/78  soft-tissue]
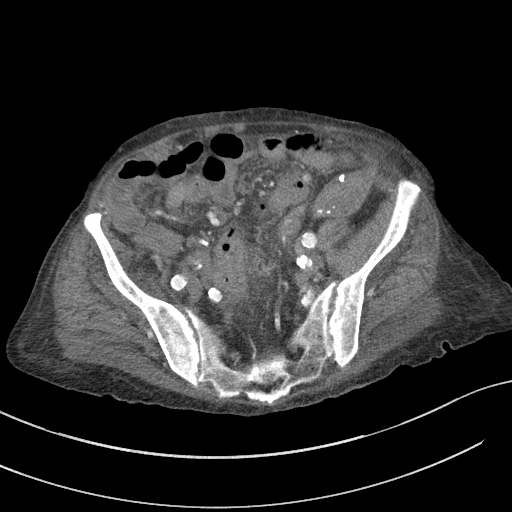
[im 41/78  soft-tissue]
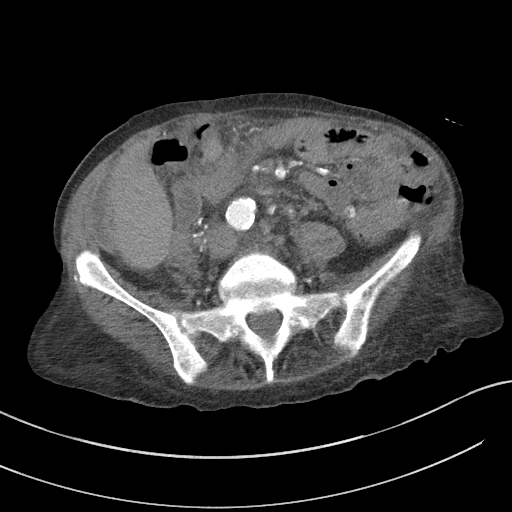
[im 45/78  soft-tissue]
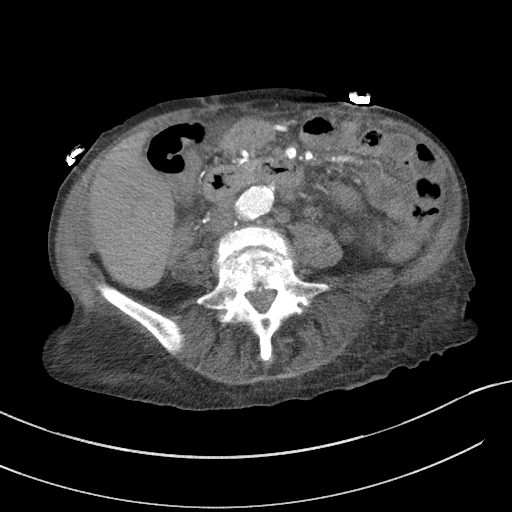
[im 49/78  soft-tissue]
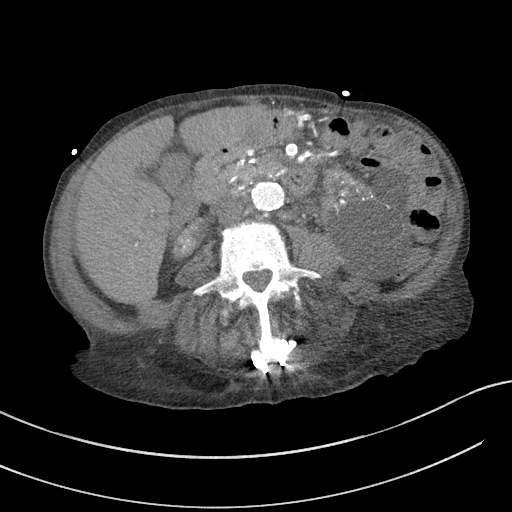
[im 49/78  bone]
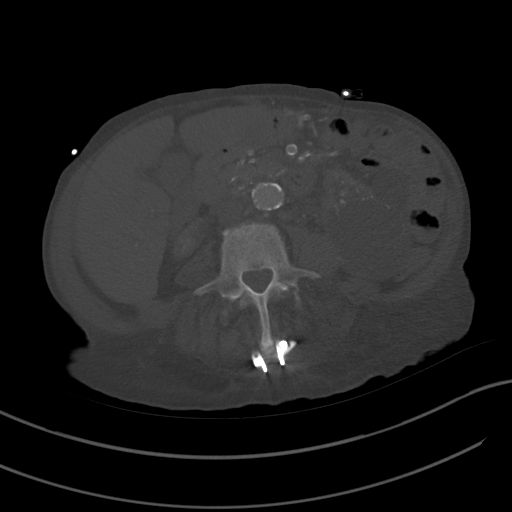
[im 57/78  soft-tissue]
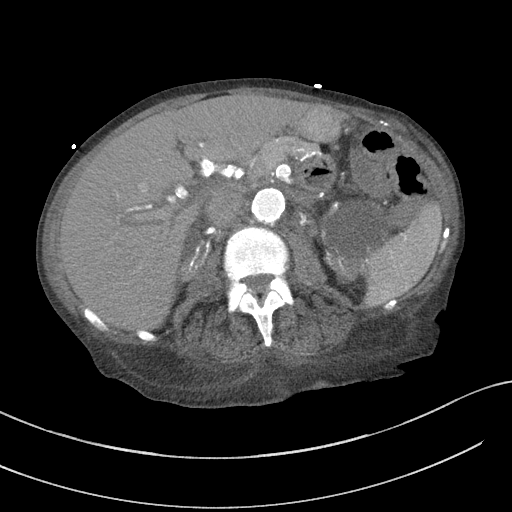
[im 61/78  soft-tissue]
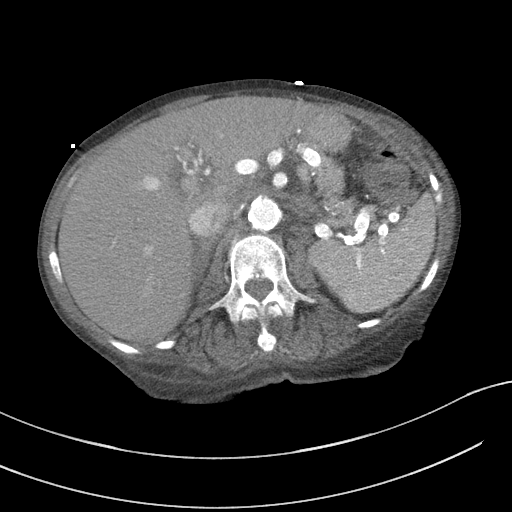
[im 65/78  soft-tissue]
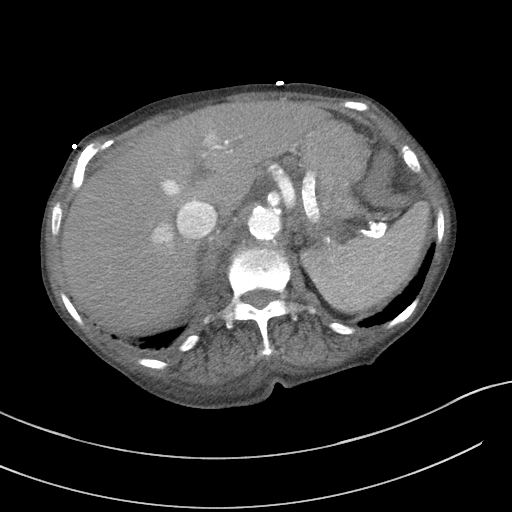
[im 73/78  soft-tissue]
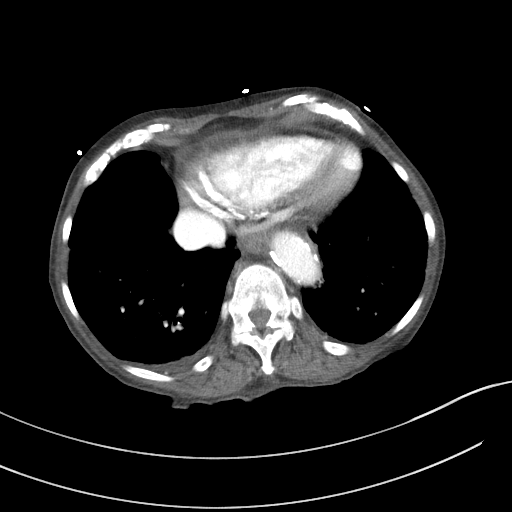

[Series 6: a/p w/ cor · coronal · 0.69mm/px · 3 of 118 slices shown]
[im 40/118  soft-tissue]
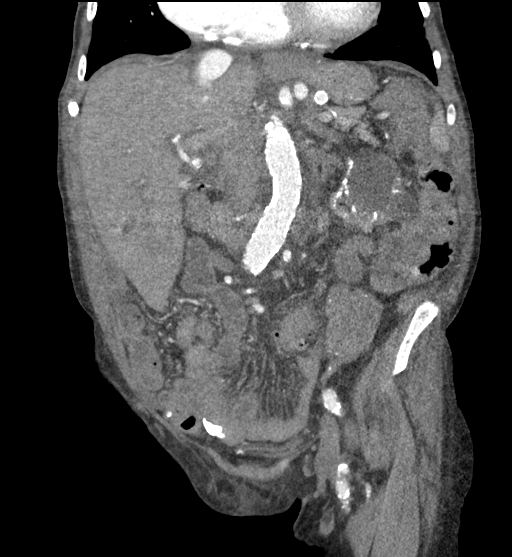
[im 53/118  soft-tissue]
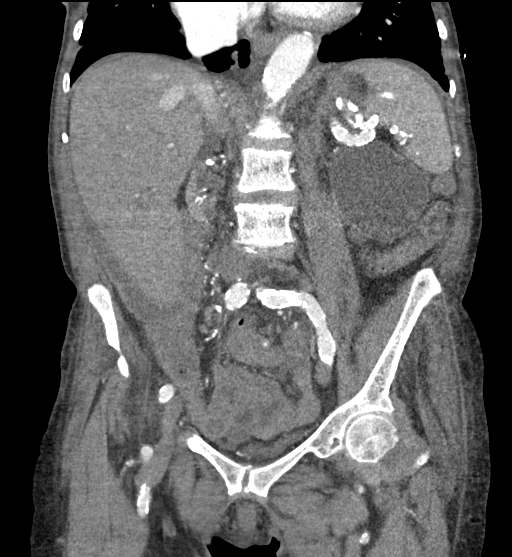
[im 66/118  soft-tissue]
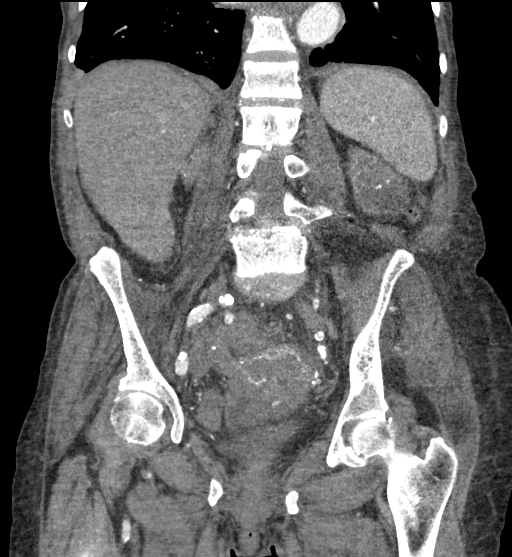

[16 of 46 positions shown; findings below may reference images not displayed]

FINDINGS: Lower chest: Trace bilateral pleural effusions. Stable chronic
appearing nodular densities in the bilateral lower lungs.
Cardiomegaly and coronary artery disease. Reflux of contrast noted
into the hepatic veins.

Hepatobiliary: Liver is normal in size and contour with no
suspicious mass identified. Gallbladder is surgically absent. No
biliary ductal dilatation appreciated.

Pancreas: Unremarkable. No pancreatic ductal dilatation or
surrounding inflammatory changes.

Spleen: Normal in size without focal abnormality.

Adrenals/Urinary Tract: Adrenal glands appears stable and within
normal limits. Kidneys are markedly atrophic without definite
hydronephrosis. 7 cm cyst of the left kidney. Urinary bladder is
nondistended and not well evaluated.

Stomach/Bowel: Small hiatal hernia. No evidence of bowel
obstruction, free air or pneumatosis. Colonic diverticulosis. There
appears to be wall thickening of the sigmoid colon which may be
acute or chronic. Limited evaluation of bowel due to nondistention.
No definite evidence of acute appendicitis, appendix not visualized.

Vascular/Lymphatic: Severe atherosclerotic disease. 1.6 cm aneurysm
of the proximal left renal artery is unchanged. No bulky
lymphadenopathy visualized.

Reproductive: No suspicious adnexal mass visualized.

Other: No frank ascites visualized. Stable amorphous density in the
anterior right lower abdomen/pelvis.

Musculoskeletal: Postsurgical changes and severe degenerative
changes at L4-L5. Changes of renal osteodystrophy.
IMPRESSION: 1. Colonic diverticulosis. Wall thickening of the sigmoid colon
which appears similar to previous study may represent chronic or
possibly mild acute on chronic diverticulitis.
2. Trace pleural effusions.
3. Numerous additional stable and chronic findings as described.

## 2022-05-27 DIAGNOSIS — R52 Pain, unspecified: Secondary | ICD-10-CM | POA: Diagnosis not present

## 2022-05-27 DIAGNOSIS — N186 End stage renal disease: Secondary | ICD-10-CM | POA: Diagnosis not present

## 2022-05-27 DIAGNOSIS — D631 Anemia in chronic kidney disease: Secondary | ICD-10-CM | POA: Diagnosis not present

## 2022-05-27 DIAGNOSIS — Z992 Dependence on renal dialysis: Secondary | ICD-10-CM | POA: Diagnosis not present

## 2022-05-27 DIAGNOSIS — N2581 Secondary hyperparathyroidism of renal origin: Secondary | ICD-10-CM | POA: Diagnosis not present

## 2022-05-27 DIAGNOSIS — A4901 Methicillin susceptible Staphylococcus aureus infection, unspecified site: Secondary | ICD-10-CM | POA: Diagnosis not present

## 2022-05-29 DIAGNOSIS — N2581 Secondary hyperparathyroidism of renal origin: Secondary | ICD-10-CM | POA: Diagnosis not present

## 2022-05-29 DIAGNOSIS — R52 Pain, unspecified: Secondary | ICD-10-CM | POA: Diagnosis not present

## 2022-05-29 DIAGNOSIS — A4901 Methicillin susceptible Staphylococcus aureus infection, unspecified site: Secondary | ICD-10-CM | POA: Diagnosis not present

## 2022-05-29 DIAGNOSIS — N186 End stage renal disease: Secondary | ICD-10-CM | POA: Diagnosis not present

## 2022-05-29 DIAGNOSIS — Z992 Dependence on renal dialysis: Secondary | ICD-10-CM | POA: Diagnosis not present

## 2022-05-29 DIAGNOSIS — D631 Anemia in chronic kidney disease: Secondary | ICD-10-CM | POA: Diagnosis not present

## 2022-06-01 DIAGNOSIS — A4901 Methicillin susceptible Staphylococcus aureus infection, unspecified site: Secondary | ICD-10-CM | POA: Diagnosis not present

## 2022-06-01 DIAGNOSIS — N2581 Secondary hyperparathyroidism of renal origin: Secondary | ICD-10-CM | POA: Diagnosis not present

## 2022-06-01 DIAGNOSIS — Z992 Dependence on renal dialysis: Secondary | ICD-10-CM | POA: Diagnosis not present

## 2022-06-01 DIAGNOSIS — N186 End stage renal disease: Secondary | ICD-10-CM | POA: Diagnosis not present

## 2022-06-01 DIAGNOSIS — R52 Pain, unspecified: Secondary | ICD-10-CM | POA: Diagnosis not present

## 2022-06-01 DIAGNOSIS — D631 Anemia in chronic kidney disease: Secondary | ICD-10-CM | POA: Diagnosis not present

## 2022-06-03 DIAGNOSIS — I214 Non-ST elevation (NSTEMI) myocardial infarction: Secondary | ICD-10-CM | POA: Diagnosis not present

## 2022-06-03 DIAGNOSIS — D631 Anemia in chronic kidney disease: Secondary | ICD-10-CM | POA: Diagnosis not present

## 2022-06-03 DIAGNOSIS — N186 End stage renal disease: Secondary | ICD-10-CM | POA: Diagnosis not present

## 2022-06-03 DIAGNOSIS — Z992 Dependence on renal dialysis: Secondary | ICD-10-CM | POA: Diagnosis not present

## 2022-06-03 DIAGNOSIS — I509 Heart failure, unspecified: Secondary | ICD-10-CM | POA: Diagnosis not present

## 2022-06-03 DIAGNOSIS — R7881 Bacteremia: Secondary | ICD-10-CM | POA: Diagnosis not present

## 2022-06-03 DIAGNOSIS — N2581 Secondary hyperparathyroidism of renal origin: Secondary | ICD-10-CM | POA: Diagnosis not present

## 2022-06-03 DIAGNOSIS — R52 Pain, unspecified: Secondary | ICD-10-CM | POA: Diagnosis not present

## 2022-06-03 DIAGNOSIS — B9561 Methicillin susceptible Staphylococcus aureus infection as the cause of diseases classified elsewhere: Secondary | ICD-10-CM | POA: Diagnosis not present

## 2022-06-03 DIAGNOSIS — A4901 Methicillin susceptible Staphylococcus aureus infection, unspecified site: Secondary | ICD-10-CM | POA: Diagnosis not present

## 2022-06-04 IMAGING — CR DG CHEST 2V
2 series · 2 of 2 positions shown · non-contrast
Comparison: Chest x-ray 10/13/2021.

CLINICAL DATA: 70-year-old female with history of shortness of
breath since yesterday evening.

EXAM:
CHEST - 2 VIEW

[chest pa]
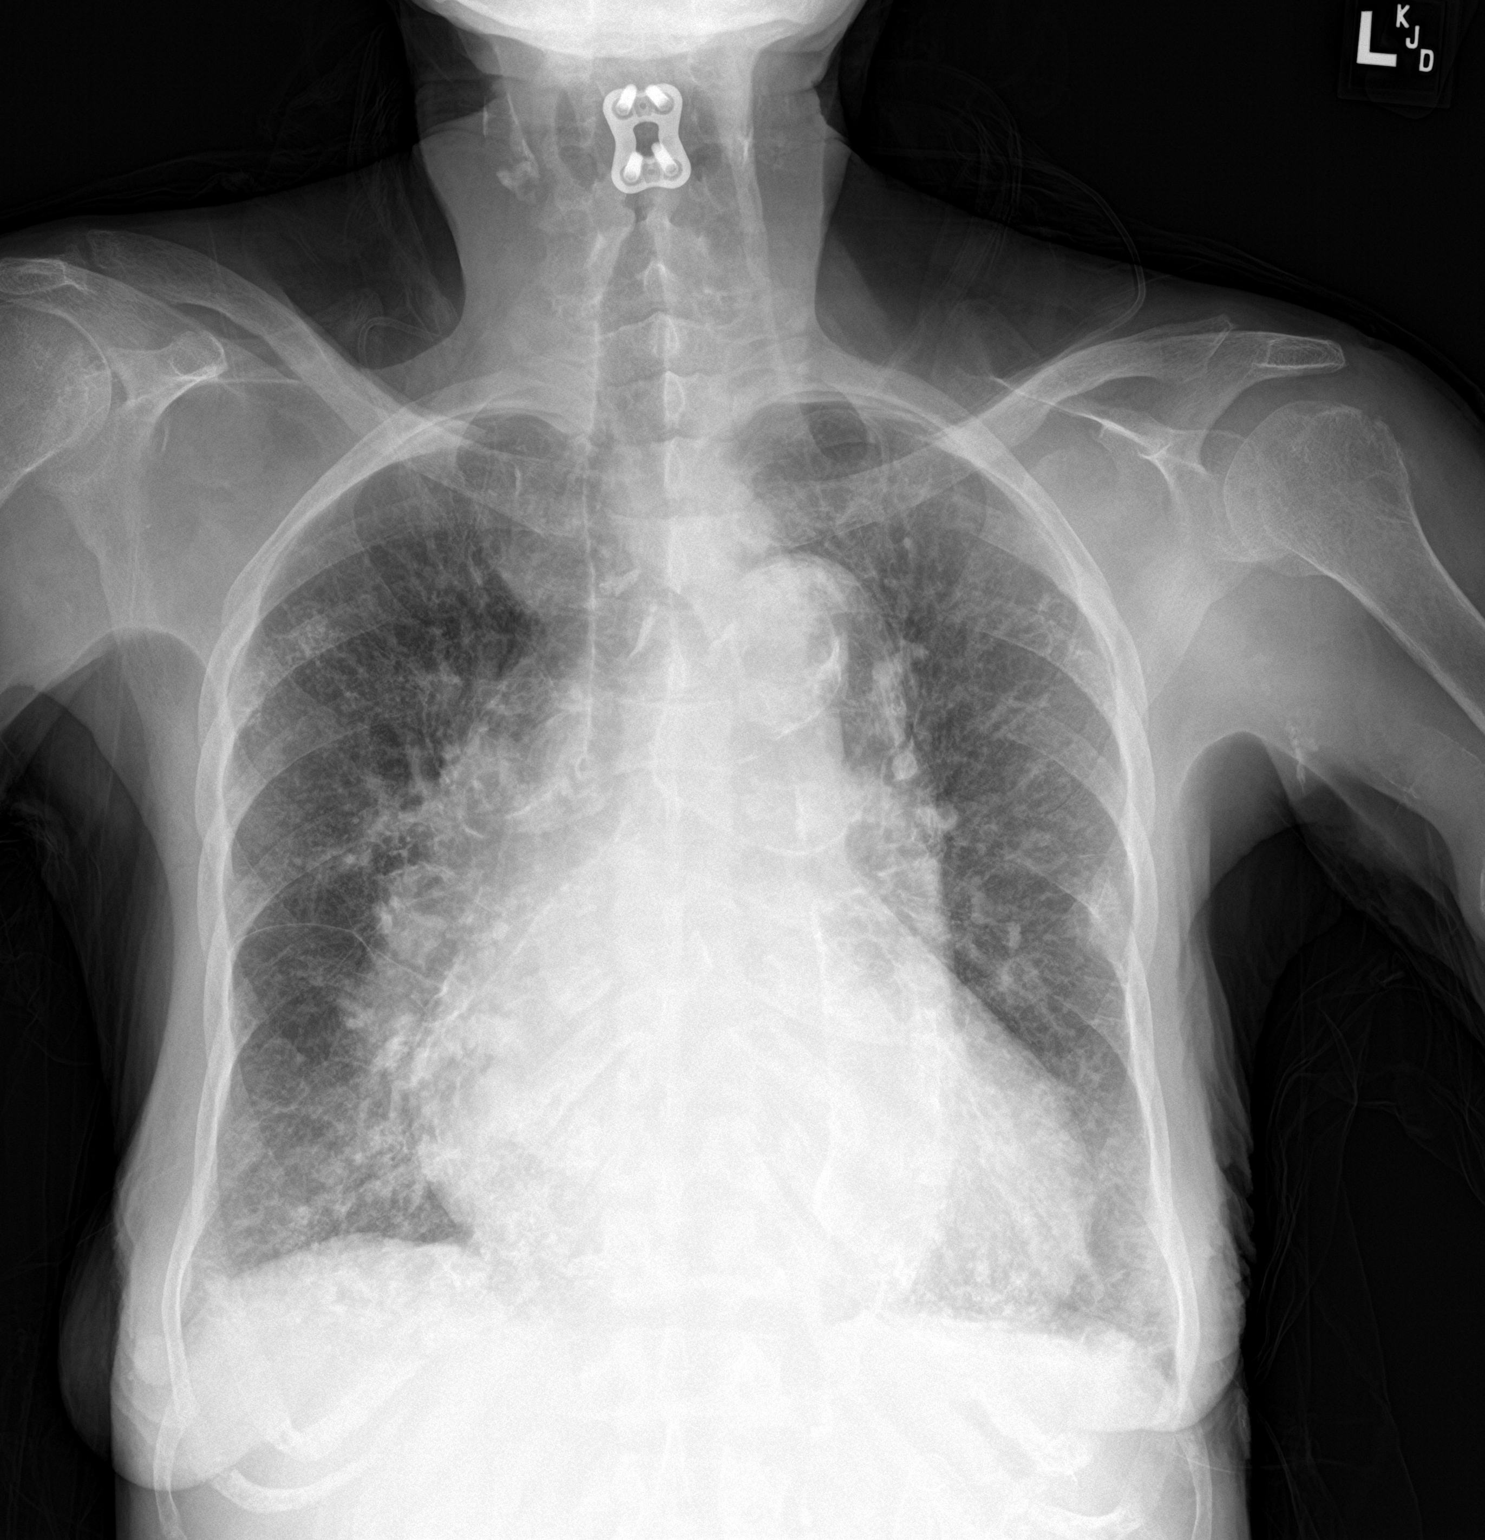

[chest lat]
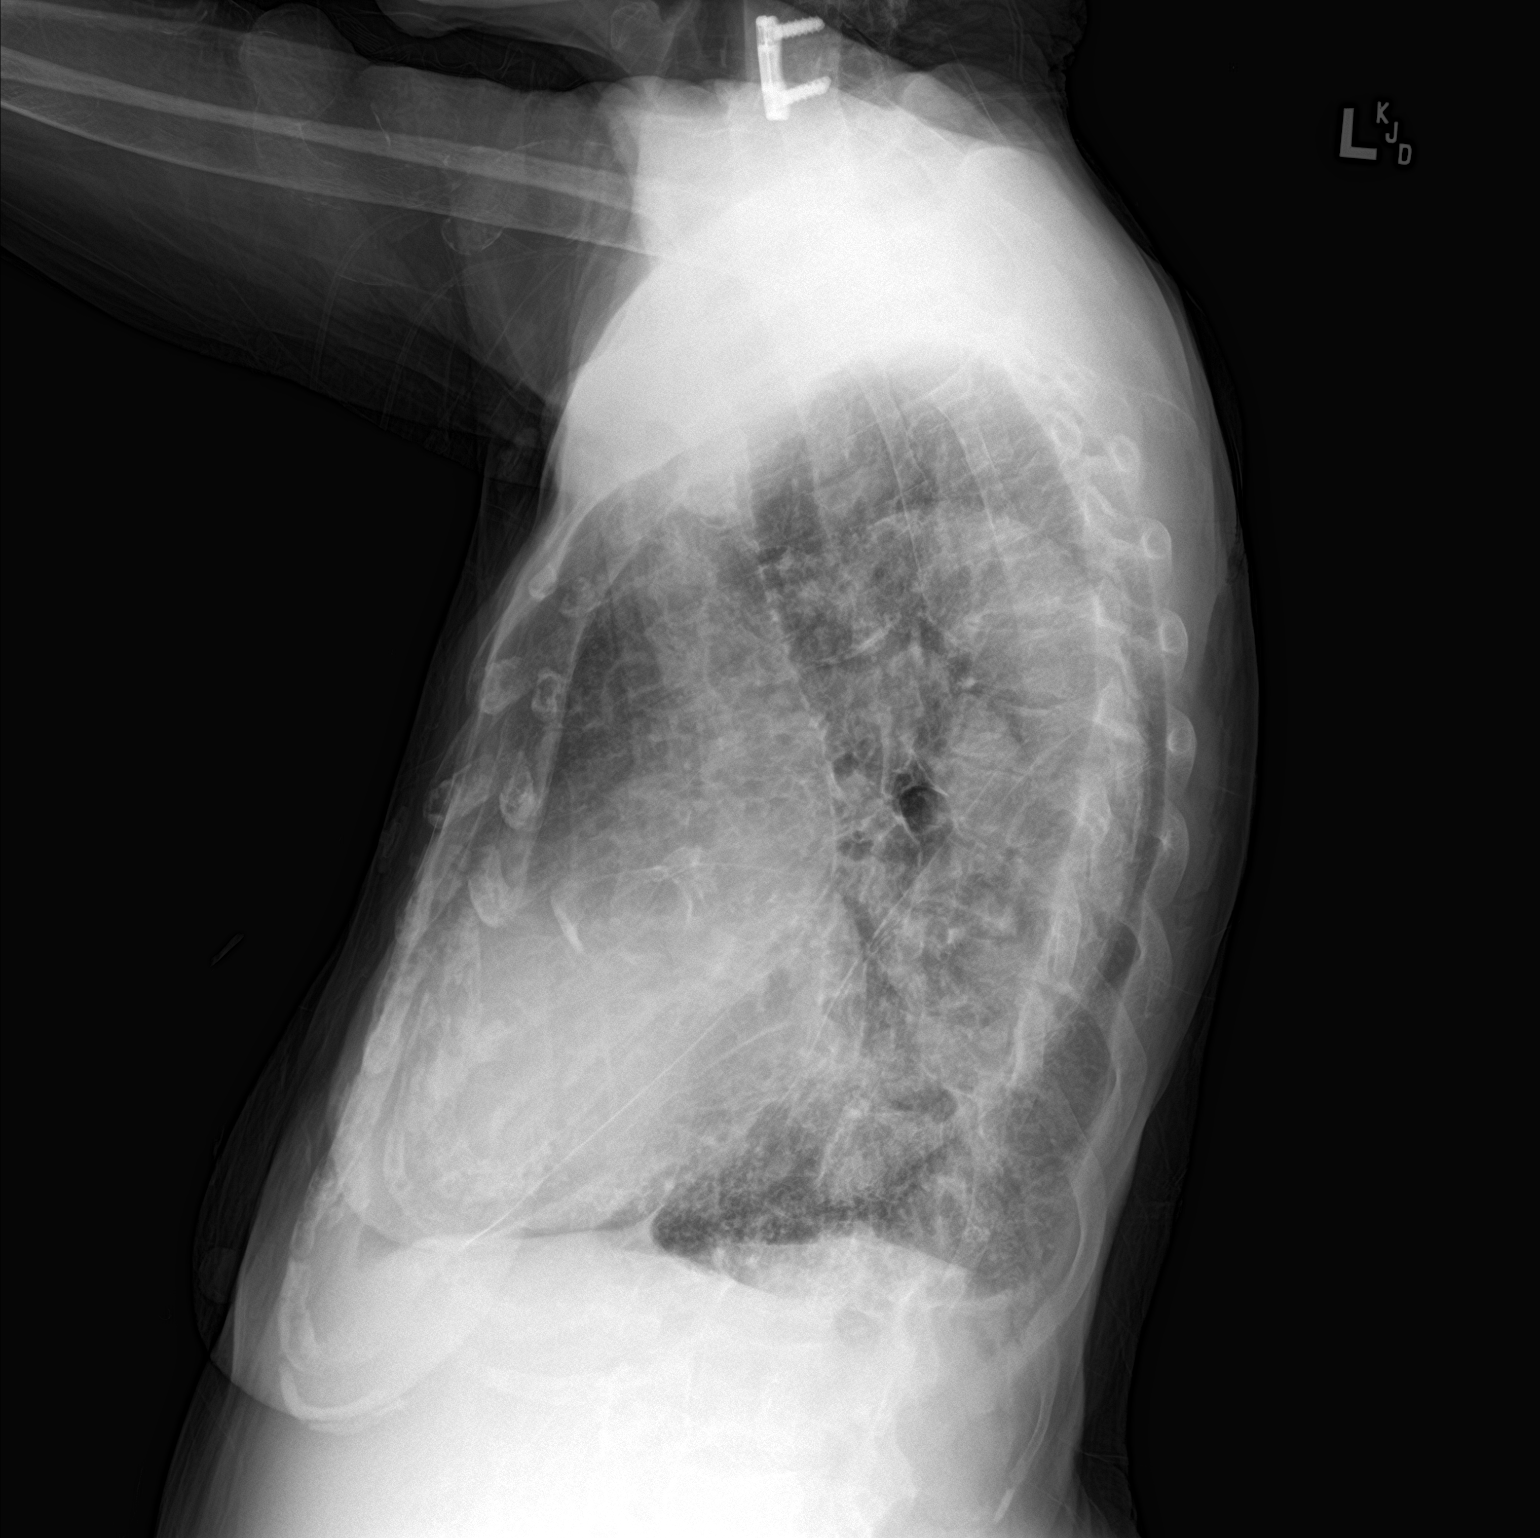

[2 of 2 positions shown; findings below may reference images not displayed]

FINDINGS: Lung volumes are normal. No consolidative airspace disease. Trace
bilateral pleural effusions. No pneumothorax. There is cephalization
of the pulmonary vasculature and slight indistinctness of the
interstitial markings suggestive of mild pulmonary edema. Patchy
areas of mild scarring are noted in the periphery of the upper lobes
of the lungs bilaterally, similar to the prior study. Moderate
cardiomegaly. The patient is rotated to the right on today's exam,
resulting in distortion of the mediastinal contours and reduced
diagnostic sensitivity and specificity for mediastinal pathology.
Atherosclerotic calcifications are noted in the thoracic aorta. Old
healed fracture of the lateral aspect of the left seventh rib
incidentally noted. Orthopedic fixation hardware in the cervical
spine incidentally noted.
IMPRESSION: 1. The appearance the chest suggests congestive heart failure, as
above.
2. Aortic atherosclerosis.

## 2022-06-05 DIAGNOSIS — Z992 Dependence on renal dialysis: Secondary | ICD-10-CM | POA: Diagnosis not present

## 2022-06-05 DIAGNOSIS — N186 End stage renal disease: Secondary | ICD-10-CM | POA: Diagnosis not present

## 2022-06-05 DIAGNOSIS — R52 Pain, unspecified: Secondary | ICD-10-CM | POA: Diagnosis not present

## 2022-06-05 DIAGNOSIS — A4901 Methicillin susceptible Staphylococcus aureus infection, unspecified site: Secondary | ICD-10-CM | POA: Diagnosis not present

## 2022-06-05 DIAGNOSIS — N2581 Secondary hyperparathyroidism of renal origin: Secondary | ICD-10-CM | POA: Diagnosis not present

## 2022-06-05 DIAGNOSIS — D631 Anemia in chronic kidney disease: Secondary | ICD-10-CM | POA: Diagnosis not present

## 2022-06-05 IMAGING — DX DG CHEST 2V
2 series · 2 of 2 positions shown · non-contrast
Comparison: Chest radiograph 10/26/2021

CLINICAL DATA: Shortness of breath

EXAM:
CHEST - 2 VIEW

[chest lat]
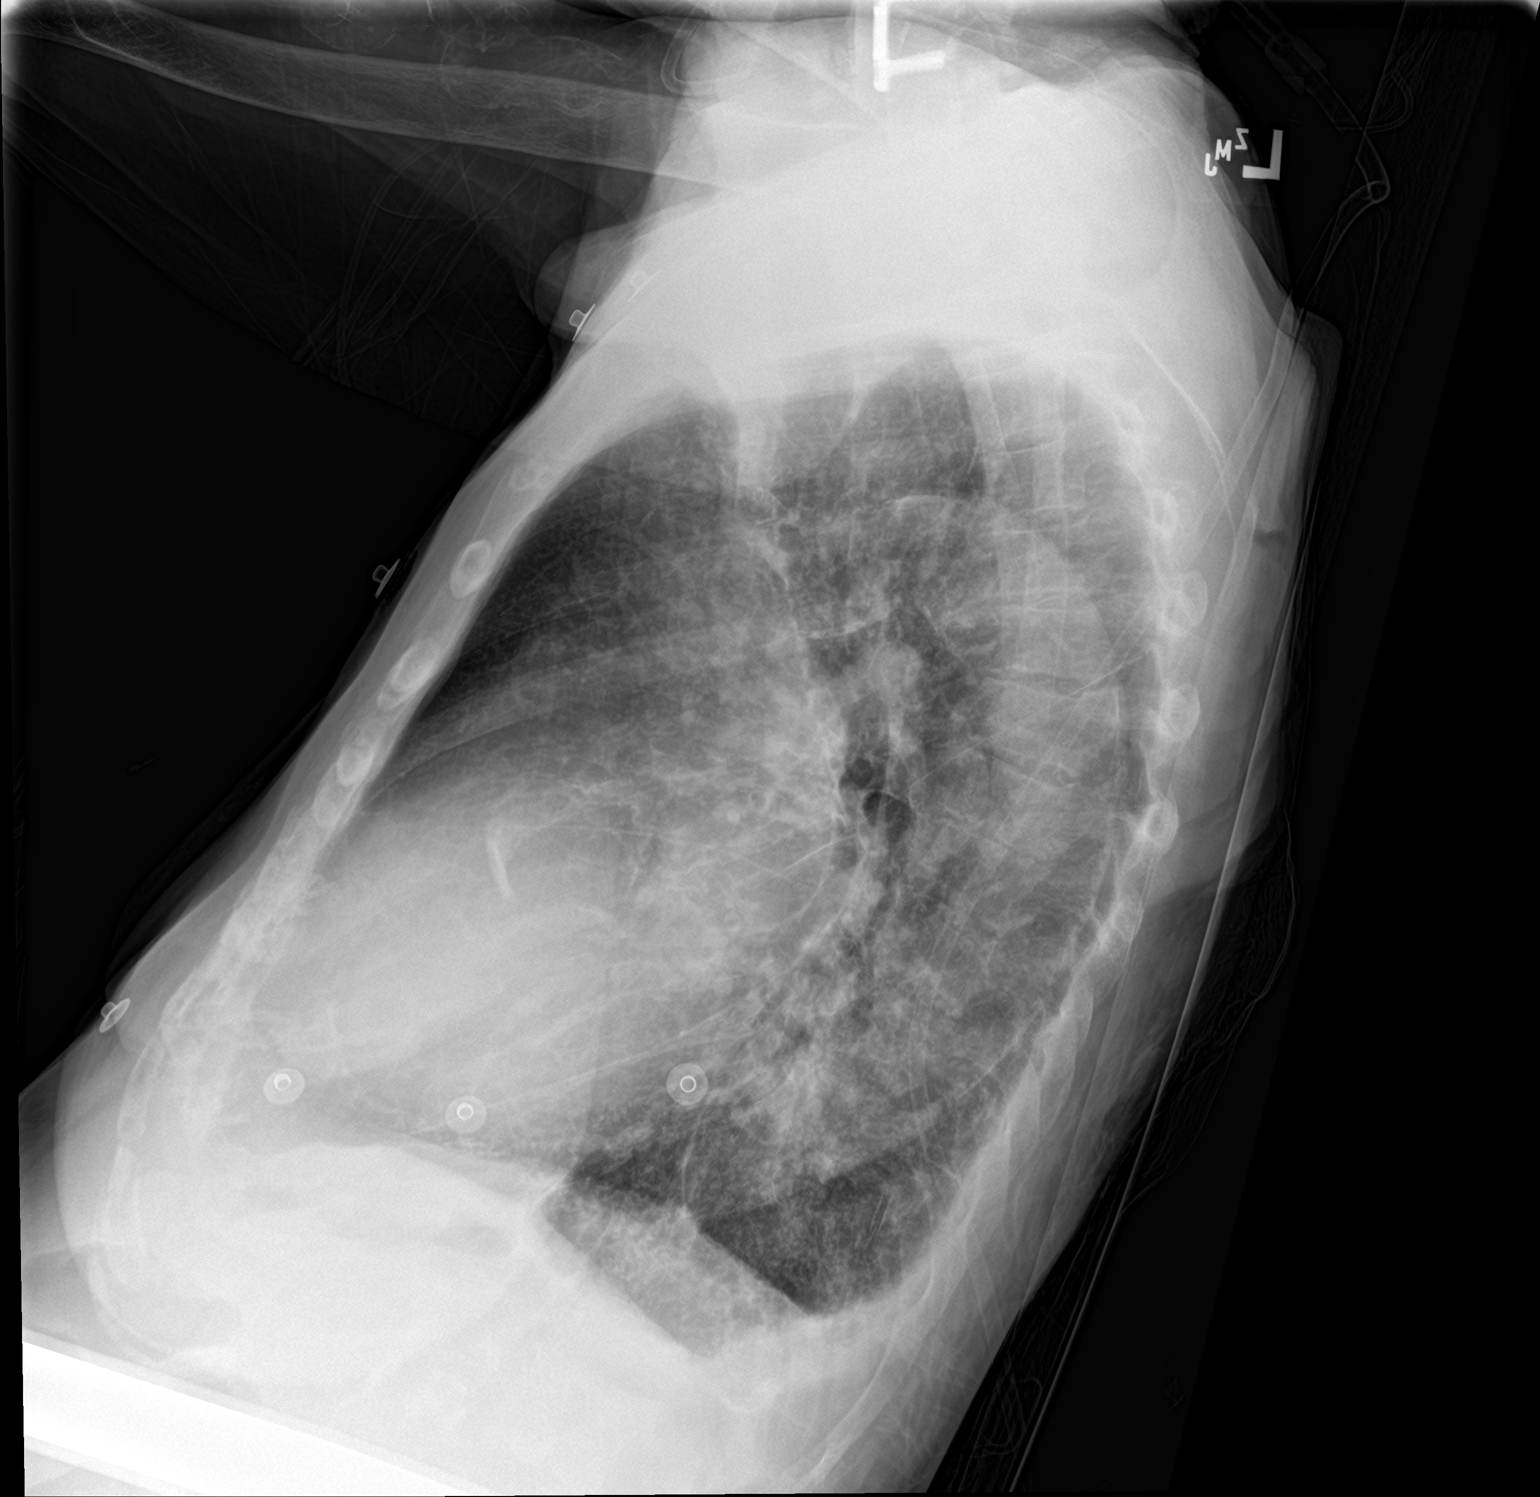

[chest ap]
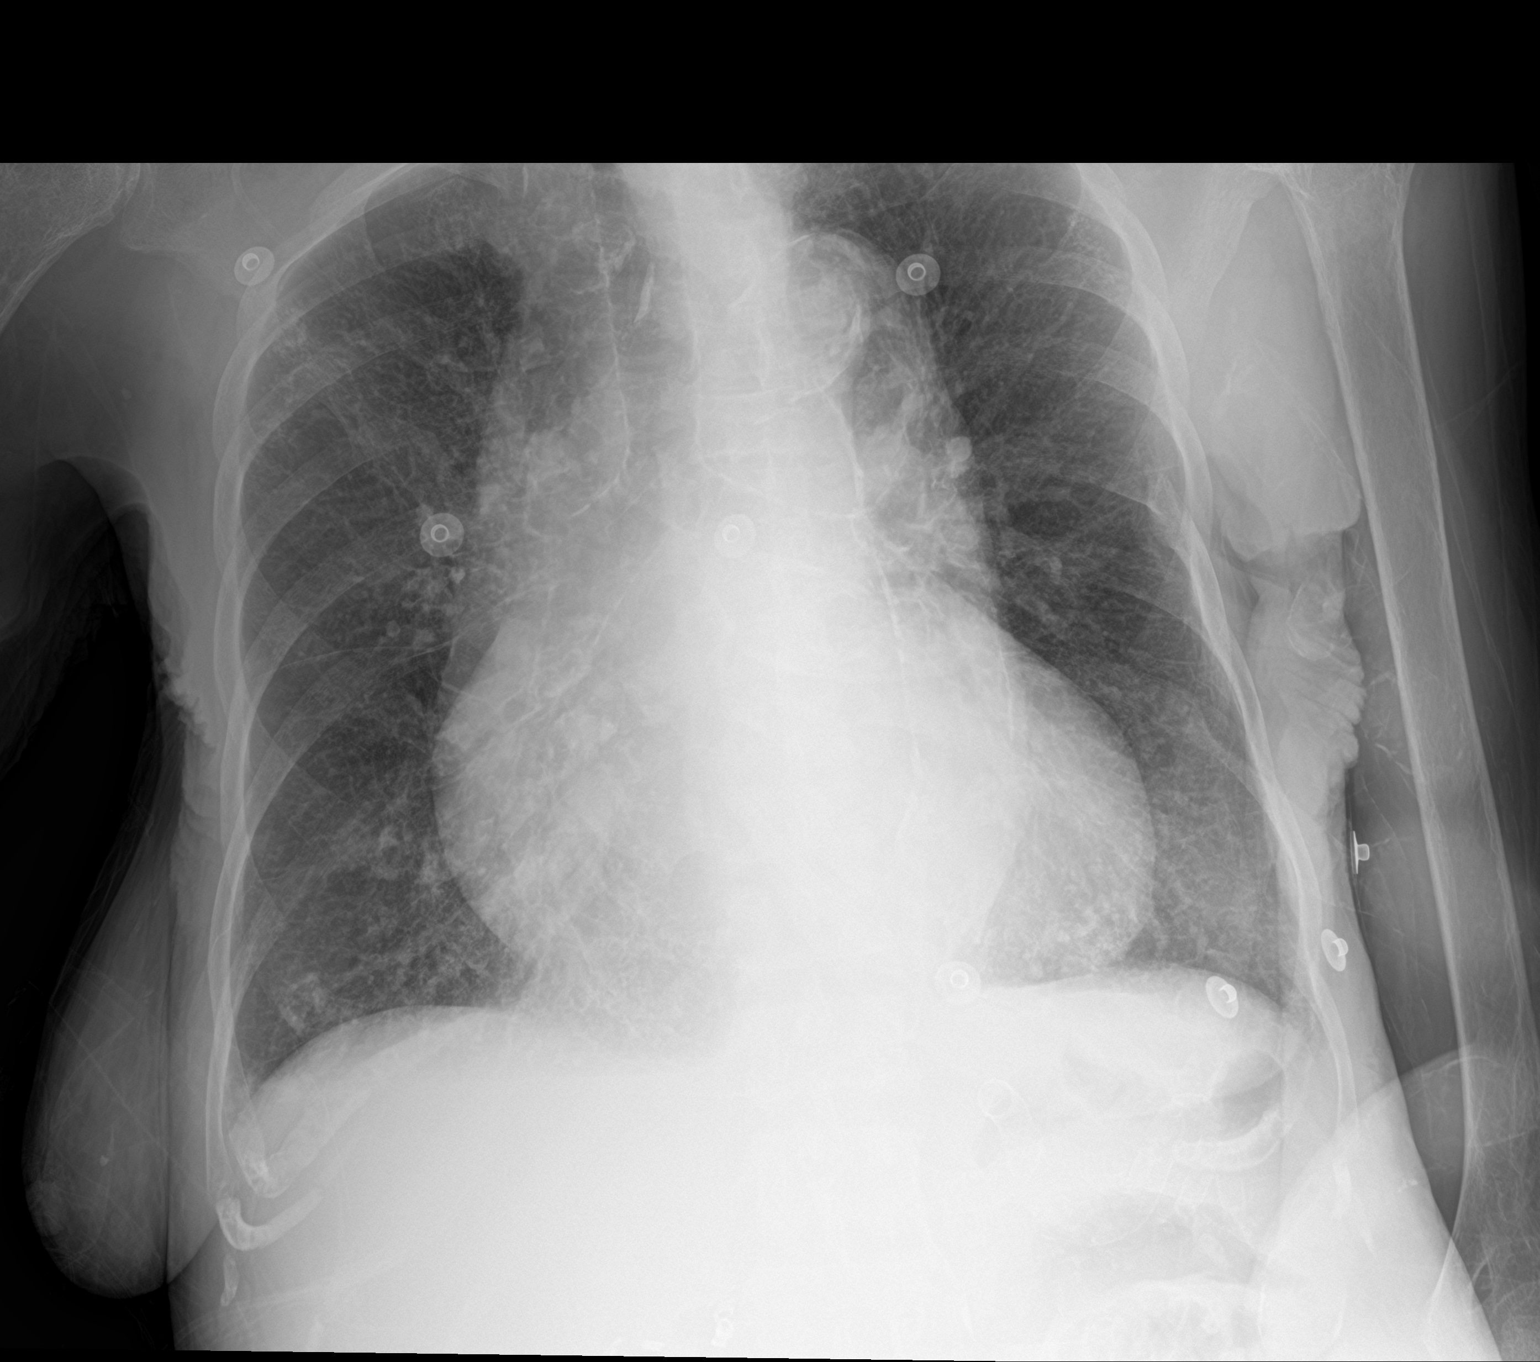

[2 of 2 positions shown; findings below may reference images not displayed]

FINDINGS: The heart is enlarged, unchanged. The mediastinum appears widened,
similar to the study from 10/13/2021. There is calcified
atherosclerotic plaque throughout the thoracic aorta, unchanged.

There is no focal consolidation. There are small bilateral pleural
effusions, slightly increased in size. Again seen is cephalization
of the pulmonary vasculature suggesting mild pulmonary interstitial
edema. There is no pneumothorax.

The bones are stable, including a remote fracture of the left
seventh rib.
IMPRESSION: 1. Slightly increased size of small bilateral pleural effusions and
unchanged mild pulmonary interstitial edema.
2. Unchanged cardiomegaly and prominent mediastinum.

## 2022-06-08 DIAGNOSIS — N186 End stage renal disease: Secondary | ICD-10-CM | POA: Diagnosis not present

## 2022-06-08 DIAGNOSIS — N2581 Secondary hyperparathyroidism of renal origin: Secondary | ICD-10-CM | POA: Diagnosis not present

## 2022-06-08 DIAGNOSIS — Z992 Dependence on renal dialysis: Secondary | ICD-10-CM | POA: Diagnosis not present

## 2022-06-08 DIAGNOSIS — R52 Pain, unspecified: Secondary | ICD-10-CM | POA: Diagnosis not present

## 2022-06-08 DIAGNOSIS — A4901 Methicillin susceptible Staphylococcus aureus infection, unspecified site: Secondary | ICD-10-CM | POA: Diagnosis not present

## 2022-06-08 DIAGNOSIS — D631 Anemia in chronic kidney disease: Secondary | ICD-10-CM | POA: Diagnosis not present

## 2022-06-08 NOTE — Progress Notes (Unsigned)
    Postoperative Access Visit   History of Present Illness   [name] is a 71 y.o. year old female who presents for postoperative follow-up for: Exploration of left arm graft and evacuation of hematoma By Dr. Scot Dock on 05/15/22. Prior to this she also had to have a excision of segment of right arm AV graft with revision (interposition 6 mm PTFE graft) on 04/27/22 by Dr. Scot Dock. This was due to wound present overlying graft with concerns of infection. The patient's wounds are healed.  The patient notes no steal symptoms.  She is a resident at [facility].   She also is recently s/p Angiogram on 03/26/22 by Dr. Carlis Abbott. This was for CLI with non healing wounds. She had Left SFA/ above knee popliteal artery angioplasty and stent placement as well as left peroneal angioplasty. She was noted to only have single vessel peroneal runoff. She says her left leg is doing great. She denies any pain or new wounds. She does not ambulate much. She presents in wheel chair today.   She currently dialyzes on MWF at the [facility] location  Physical Examination   Vitals:   06/11/22 0927  BP: 139/75  Pulse: 86  Resp: 14  Temp: 98.2 F (36.8 C)  TempSrc: Temporal  SpO2: 98%  Height: '5\' 2"'$  (1.575 m)   Body mass index is 18.15 kg/m.  General:  Thin, very pleasant female in no acute distress Lungs:   Non labored Chest:   regular right arm Incision is well healed, 2+ radial pulse, hand grip is 5/5, sensation in digits is intact, palpable thrill, bruit can be auscultated   Extremities: 2+ femoral pulses bilaterally, no palpable distal pulses. Small superficial dry eschar on dorsum of left foot and left 3rd toe  Medical Decision Making   [name] is a 71 y.o. year old female who presents s/p Exploration of left arm graft and evacuation of hematoma By Dr. Scot Dock on 05/15/22. Prior to this she also had to have a excision of segment of right arm AV graft with revision (interposition 6 mm PTFE  graft) on 04/27/22 by Dr. Scot Dock. This was due to wound present overlying graft with concerns of infection.  He incision is now healed. Patent is without signs or symptoms of steal syndrome. Fistula is working well. She has not followed up since her lower extremity Angiogram secondary to hospitalizations. Her current symptoms are improved. I will arrange for LLE arterial duplex and ABIs over then next 4-6 weeks to evaluate her left leg perfusion.   Kathryn Caldwell, PA-C [facility] Office: [phone]  Clinic MD: Scot Dock

## 2022-06-10 DIAGNOSIS — R52 Pain, unspecified: Secondary | ICD-10-CM | POA: Diagnosis not present

## 2022-06-10 DIAGNOSIS — N186 End stage renal disease: Secondary | ICD-10-CM | POA: Diagnosis not present

## 2022-06-10 DIAGNOSIS — A4901 Methicillin susceptible Staphylococcus aureus infection, unspecified site: Secondary | ICD-10-CM | POA: Diagnosis not present

## 2022-06-10 DIAGNOSIS — D631 Anemia in chronic kidney disease: Secondary | ICD-10-CM | POA: Diagnosis not present

## 2022-06-10 DIAGNOSIS — Z992 Dependence on renal dialysis: Secondary | ICD-10-CM | POA: Diagnosis not present

## 2022-06-10 DIAGNOSIS — N2581 Secondary hyperparathyroidism of renal origin: Secondary | ICD-10-CM | POA: Diagnosis not present

## 2022-06-11 ENCOUNTER — Ambulatory Visit (INDEPENDENT_AMBULATORY_CARE_PROVIDER_SITE_OTHER): Payer: Medicare Other | Admitting: Physician Assistant

## 2022-06-11 VITALS — BP 139/75 | HR 86 | Temp 98.2°F | Resp 14 | Ht 62.0 in

## 2022-06-11 DIAGNOSIS — Z992 Dependence on renal dialysis: Secondary | ICD-10-CM

## 2022-06-11 DIAGNOSIS — N186 End stage renal disease: Secondary | ICD-10-CM

## 2022-06-12 DIAGNOSIS — N186 End stage renal disease: Secondary | ICD-10-CM | POA: Diagnosis not present

## 2022-06-12 DIAGNOSIS — D631 Anemia in chronic kidney disease: Secondary | ICD-10-CM | POA: Diagnosis not present

## 2022-06-12 DIAGNOSIS — A4901 Methicillin susceptible Staphylococcus aureus infection, unspecified site: Secondary | ICD-10-CM | POA: Diagnosis not present

## 2022-06-12 DIAGNOSIS — R52 Pain, unspecified: Secondary | ICD-10-CM | POA: Diagnosis not present

## 2022-06-12 DIAGNOSIS — T8612 Kidney transplant failure: Secondary | ICD-10-CM | POA: Diagnosis not present

## 2022-06-12 DIAGNOSIS — N2581 Secondary hyperparathyroidism of renal origin: Secondary | ICD-10-CM | POA: Diagnosis not present

## 2022-06-12 DIAGNOSIS — Z992 Dependence on renal dialysis: Secondary | ICD-10-CM | POA: Diagnosis not present

## 2022-06-15 DIAGNOSIS — N186 End stage renal disease: Secondary | ICD-10-CM | POA: Diagnosis not present

## 2022-06-15 DIAGNOSIS — Z992 Dependence on renal dialysis: Secondary | ICD-10-CM | POA: Diagnosis not present

## 2022-06-15 DIAGNOSIS — R197 Diarrhea, unspecified: Secondary | ICD-10-CM | POA: Diagnosis not present

## 2022-06-15 DIAGNOSIS — N2581 Secondary hyperparathyroidism of renal origin: Secondary | ICD-10-CM | POA: Diagnosis not present

## 2022-06-15 DIAGNOSIS — D631 Anemia in chronic kidney disease: Secondary | ICD-10-CM | POA: Diagnosis not present

## 2022-06-15 DIAGNOSIS — L299 Pruritus, unspecified: Secondary | ICD-10-CM | POA: Diagnosis not present

## 2022-06-16 DIAGNOSIS — B9561 Methicillin susceptible Staphylococcus aureus infection as the cause of diseases classified elsewhere: Secondary | ICD-10-CM | POA: Diagnosis not present

## 2022-06-16 DIAGNOSIS — I509 Heart failure, unspecified: Secondary | ICD-10-CM | POA: Diagnosis not present

## 2022-06-16 DIAGNOSIS — R7881 Bacteremia: Secondary | ICD-10-CM | POA: Diagnosis not present

## 2022-06-16 DIAGNOSIS — I214 Non-ST elevation (NSTEMI) myocardial infarction: Secondary | ICD-10-CM | POA: Diagnosis not present

## 2022-06-16 IMAGING — DX DG CHEST 1V PORT
1 series · 1 of 1 positions shown · non-contrast
Comparison: Prior radiograph from earlier the same day.

CLINICAL DATA: Initial evaluation for volume overload, missed
dialysis.

EXAM:
PORTABLE CHEST 1 VIEW

[chest]
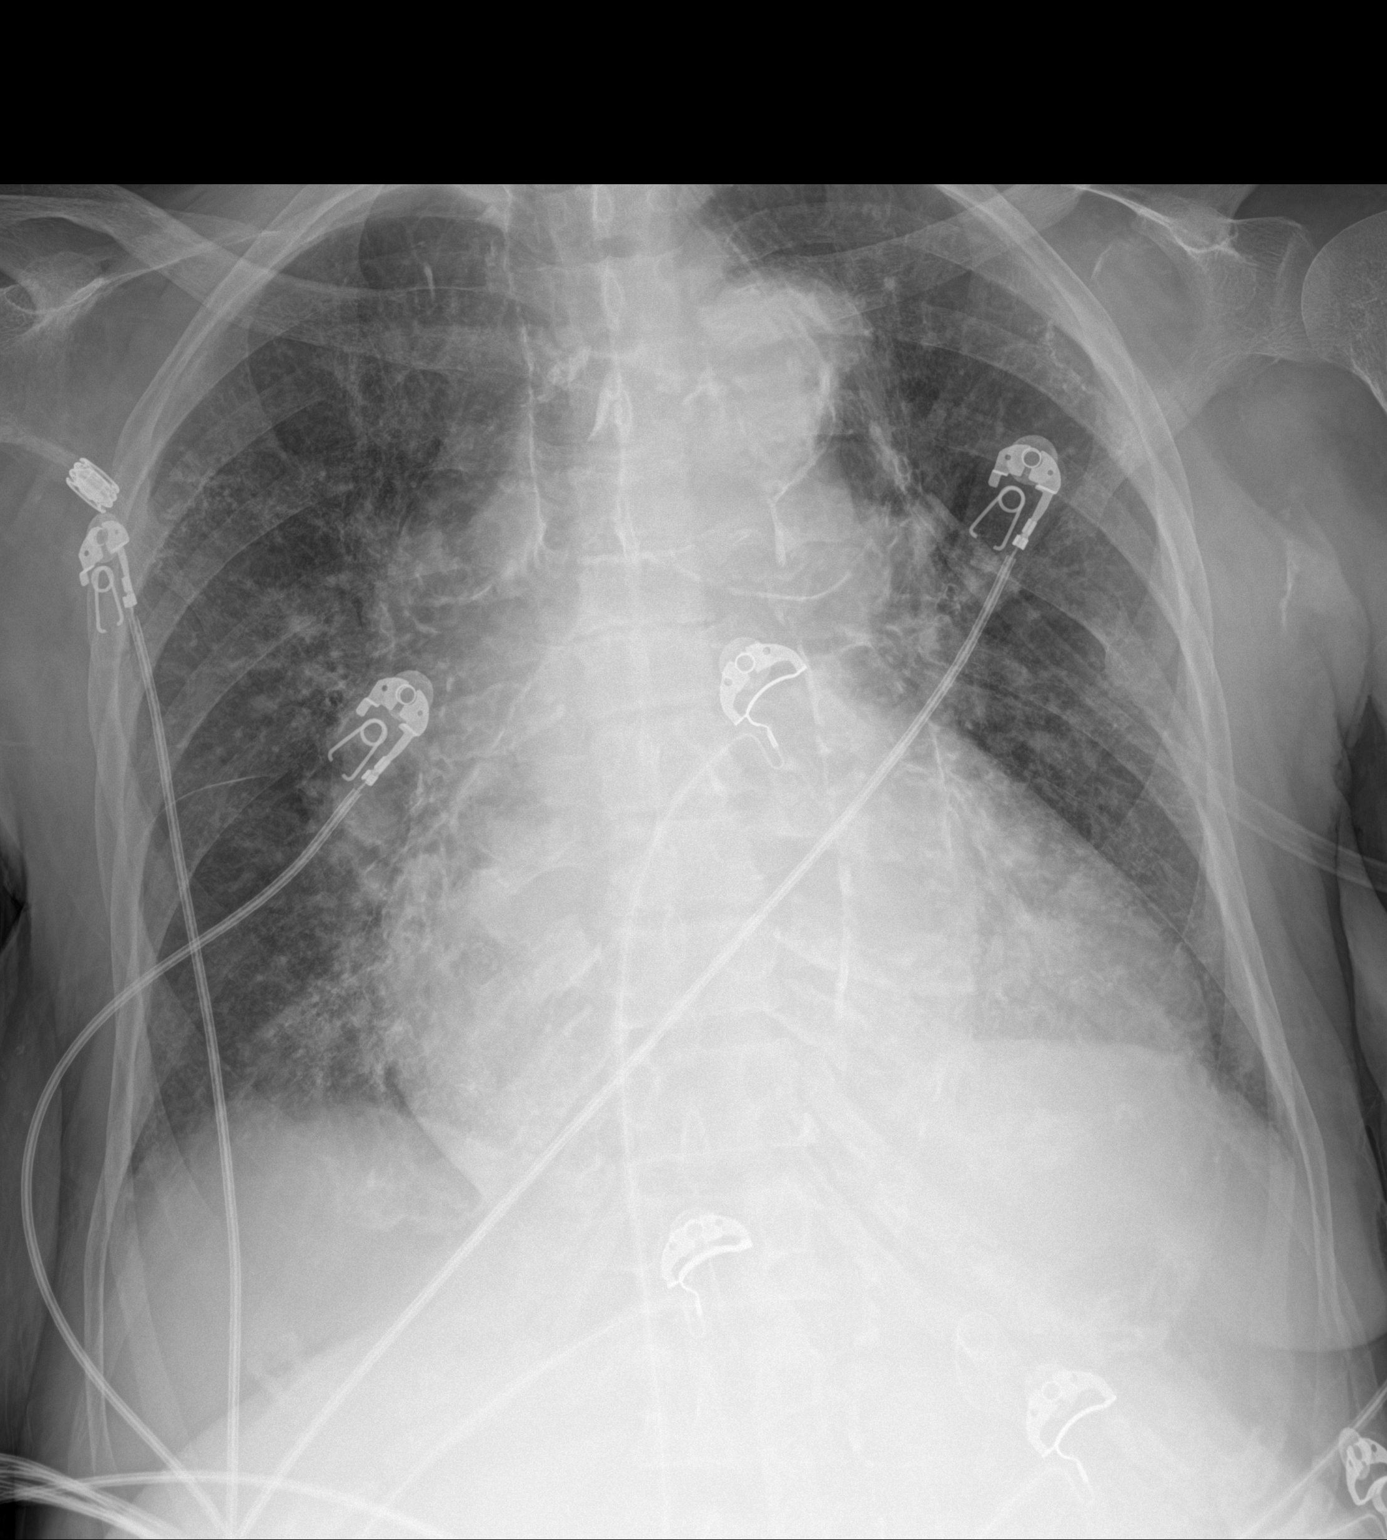

[1 of 1 positions shown; findings below may reference images not displayed]

FINDINGS: Marked cardiomegaly again seen. Mediastinal silhouette stable.
Extensive aortic atherosclerosis.

Lungs mildly hypoinflated. Persistent diffuse pulmonary interstitial
edema, relatively similar as compared to prior radiograph performed
earlier the same day. No visible pleural effusion on this single AP
view of the chest. No new consolidative airspace disease. No
pneumothorax.

No new osseous abnormality. Remotely healed left-sided rib fracture
noted.
IMPRESSION: 1. Cardiomegaly with diffuse pulmonary interstitial edema,
relatively similar as compared to prior radiograph performed earlier
the same day.
2.  Aortic Atherosclerosis ([id number]).

## 2022-06-16 IMAGING — CT CT HEAD W/O CM
4 of 5 series · 16 of 47 positions shown, 18 images · non-contrast
Comparison: CT head 02/21/2021

CLINICAL DATA: Mental status change

EXAM:
CT HEAD WITHOUT CONTRAST
TECHNIQUE: Contiguous axial images were obtained from the base of the skull
through the vertex without intravenous contrast.

[Series 2: head without · axial · non-contrast · 0.49mm/px · z∈[-179,-79]mm · 5 of 32 slices shown, 7 images]
[im 6/32  brain]
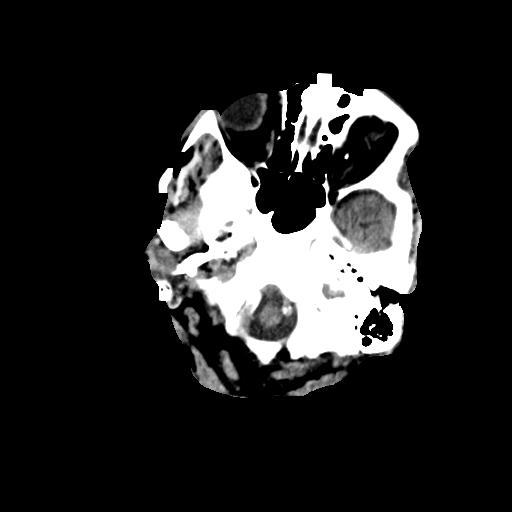
[im 6/32  bone]
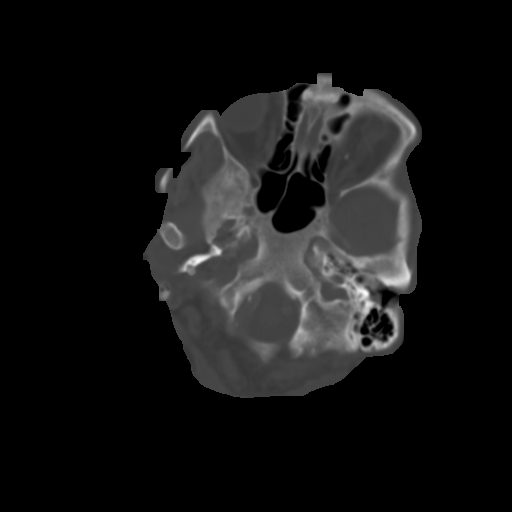
[im 11/32  brain]
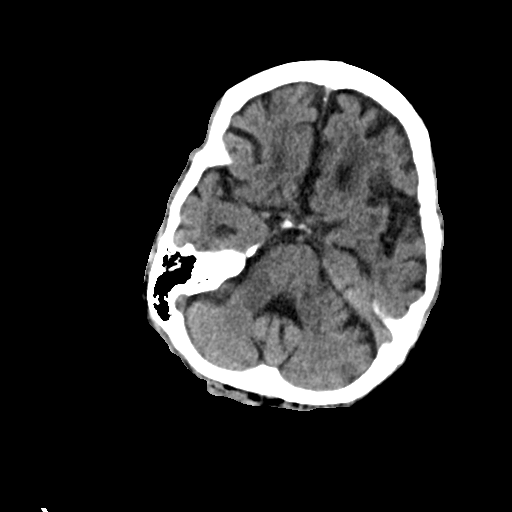
[im 16/32  brain]
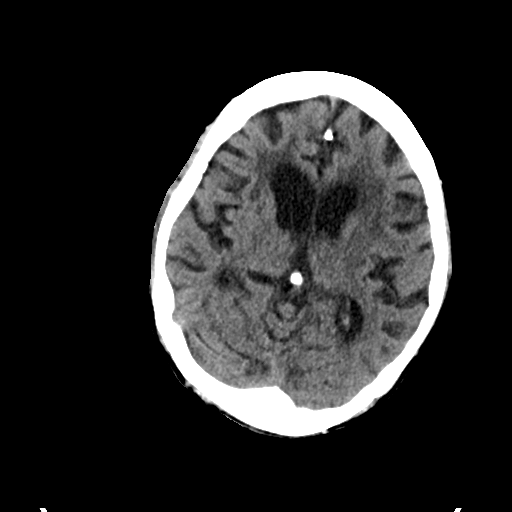
[im 21/32  brain]
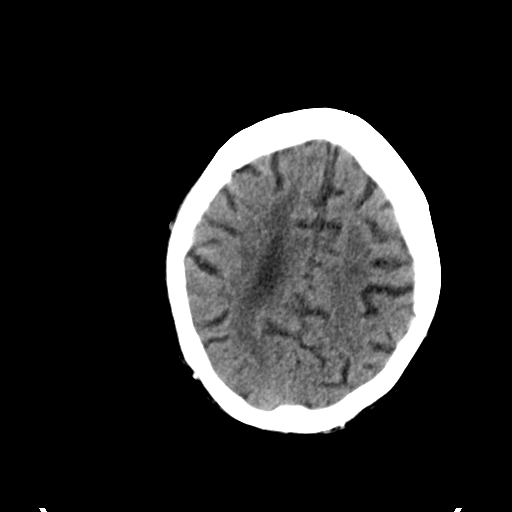
[im 26/32  brain]
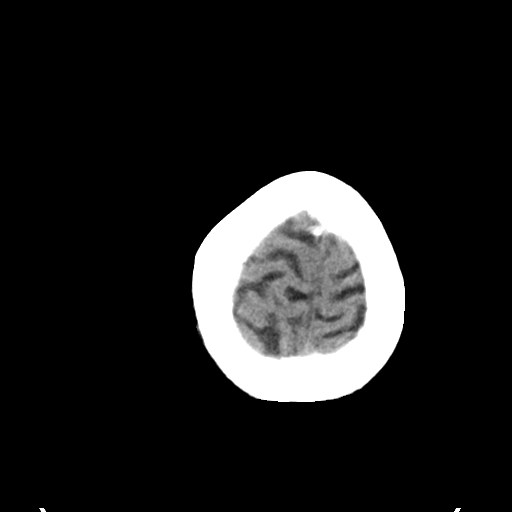
[im 26/32  bone]
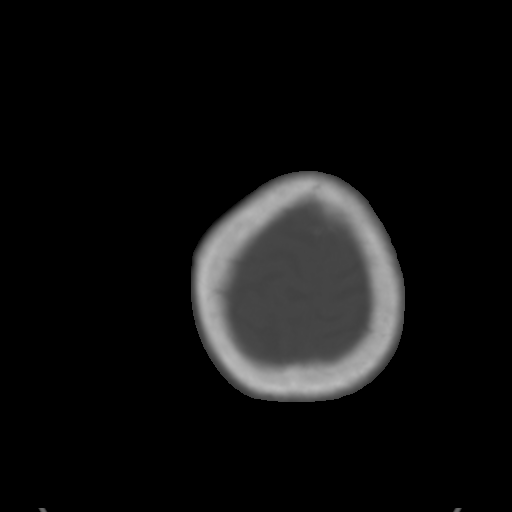

[Series 4: head without cor · coronal · non-contrast · 0.34mm/px · 3 of 67 slices shown]
[im 23/67  brain]
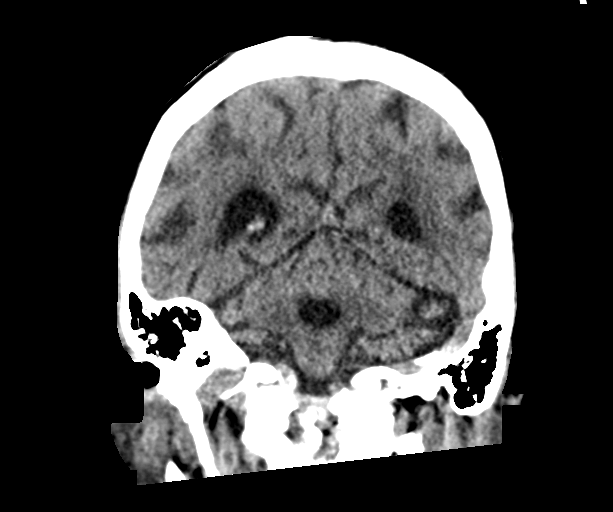
[im 30/67  brain]
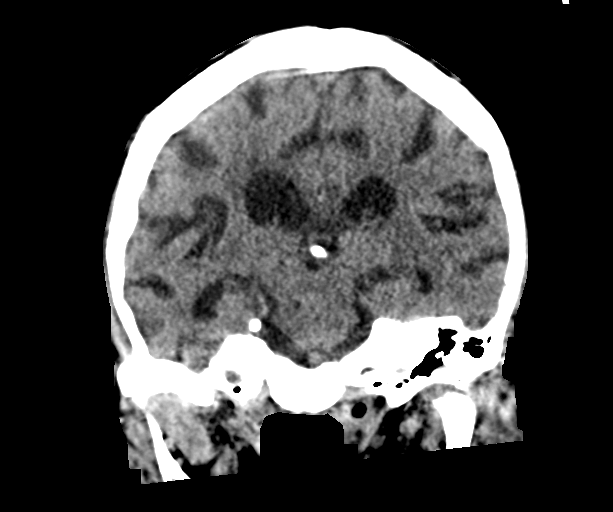
[im 37/67  brain]
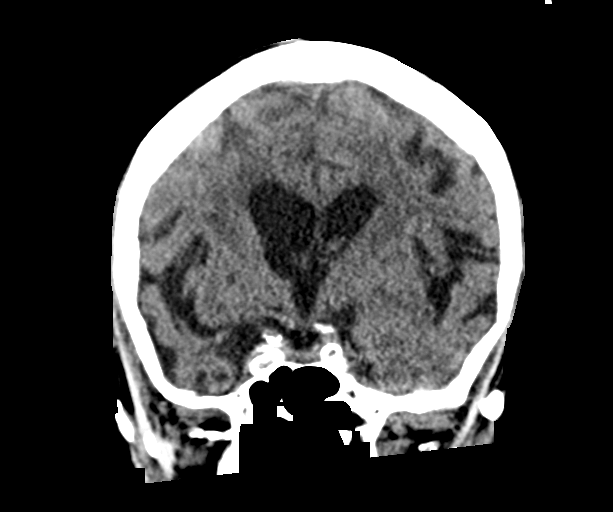

[Series 5: head without sag · sagittal · non-contrast · 0.33mm/px · 3 of 67 slices shown]
[im 24/67  brain]
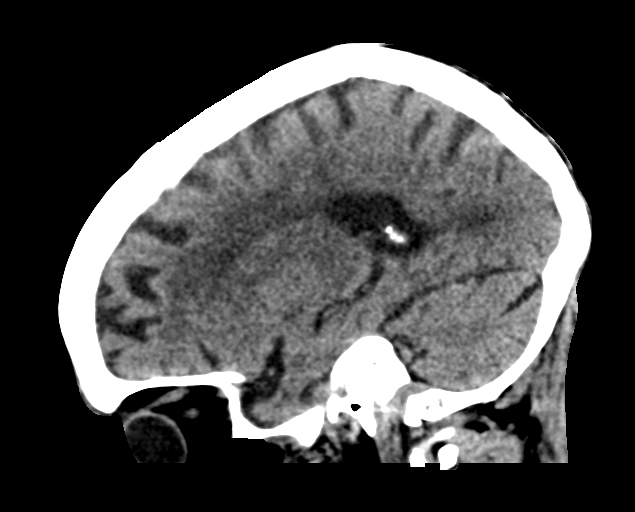
[im 34/67  brain]
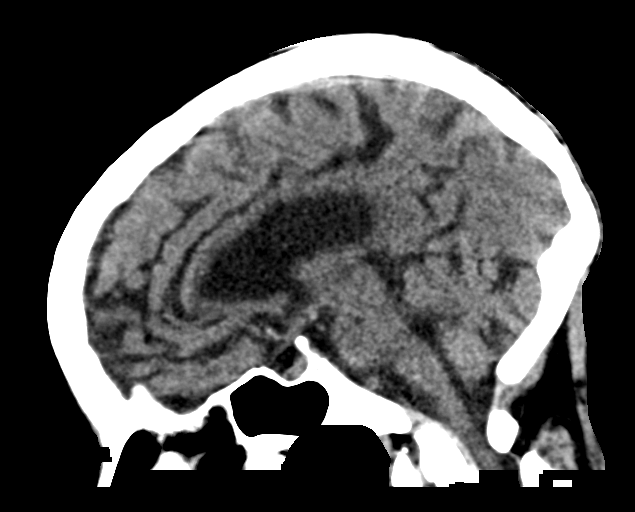
[im 44/67  brain]
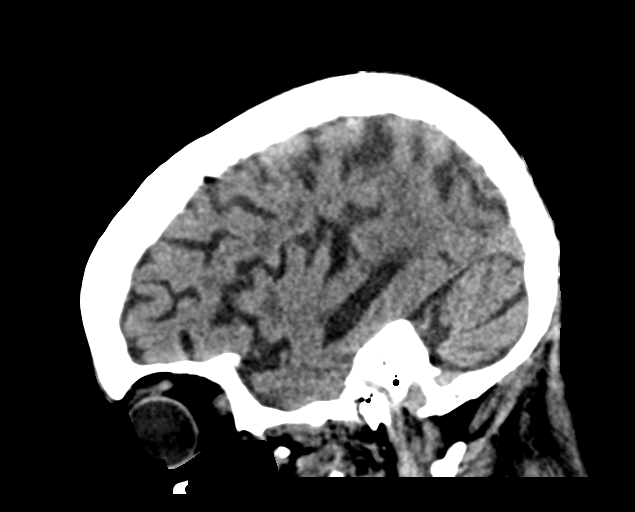

[Series 6: head without ax · axial · non-contrast · 0.35mm/px · z∈[-174,-61]mm · 5 of 35 slices shown]
[im 6/35  brain]
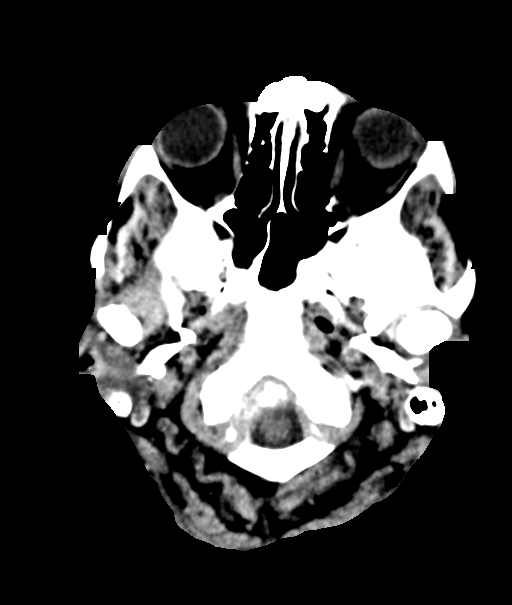
[im 12/35  brain]
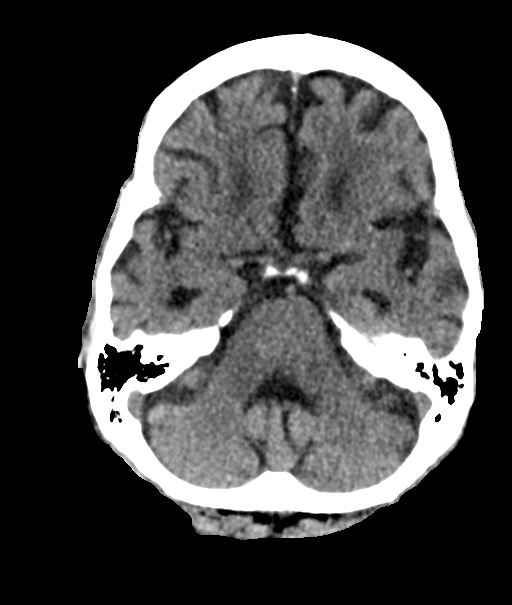
[im 18/35  brain]
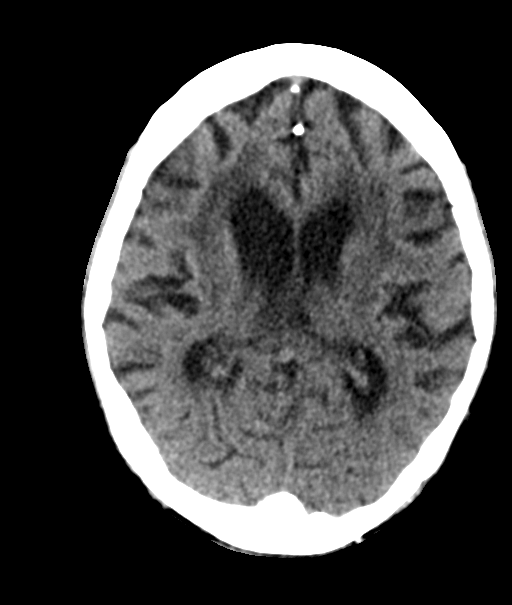
[im 23/35  brain]
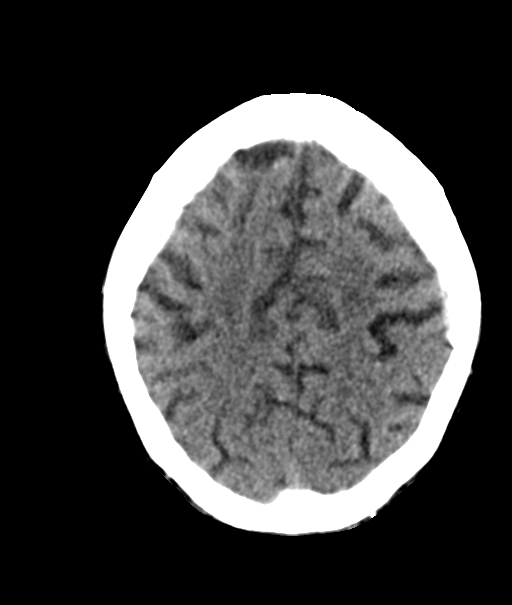
[im 29/35  brain]
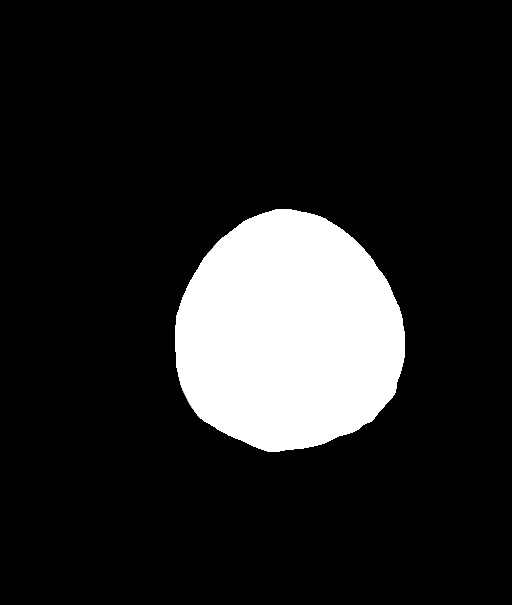

[16 of 47 positions shown; findings below may reference images not displayed]

FINDINGS: Brain: Moderate to advanced cortical atrophy most prominent in the
frontal and temporal lobes. Mild ventricular enlargement consistent
with atrophy. Moderate white matter hypodensity bilaterally.

Negative for acute infarct, hemorrhage, mass

Vascular: Atherosclerotic calcification. Negative for hyperdense
vessel

Skull: No focal skull lesion. Degenerative changes C1-2 with mild
spinal stenosis

Sinuses/Orbits: Mild mucosal edema right maxillary sinus. Remaining
sinuses clear. Bilateral cataract extraction.

Other: None
IMPRESSION: Atrophy and chronic microvascular ischemic change. No acute
abnormality.

## 2022-06-17 DIAGNOSIS — Z992 Dependence on renal dialysis: Secondary | ICD-10-CM | POA: Diagnosis not present

## 2022-06-17 DIAGNOSIS — R197 Diarrhea, unspecified: Secondary | ICD-10-CM | POA: Diagnosis not present

## 2022-06-17 DIAGNOSIS — D631 Anemia in chronic kidney disease: Secondary | ICD-10-CM | POA: Diagnosis not present

## 2022-06-17 DIAGNOSIS — N2581 Secondary hyperparathyroidism of renal origin: Secondary | ICD-10-CM | POA: Diagnosis not present

## 2022-06-17 DIAGNOSIS — N186 End stage renal disease: Secondary | ICD-10-CM | POA: Diagnosis not present

## 2022-06-17 DIAGNOSIS — L299 Pruritus, unspecified: Secondary | ICD-10-CM | POA: Diagnosis not present

## 2022-06-19 ENCOUNTER — Inpatient Hospital Stay: Payer: Medicare Other | Admitting: Internal Medicine

## 2022-06-19 DIAGNOSIS — Z992 Dependence on renal dialysis: Secondary | ICD-10-CM | POA: Diagnosis not present

## 2022-06-19 DIAGNOSIS — N186 End stage renal disease: Secondary | ICD-10-CM | POA: Diagnosis not present

## 2022-06-19 DIAGNOSIS — R197 Diarrhea, unspecified: Secondary | ICD-10-CM | POA: Diagnosis not present

## 2022-06-19 DIAGNOSIS — D631 Anemia in chronic kidney disease: Secondary | ICD-10-CM | POA: Diagnosis not present

## 2022-06-19 DIAGNOSIS — N2581 Secondary hyperparathyroidism of renal origin: Secondary | ICD-10-CM | POA: Diagnosis not present

## 2022-06-19 DIAGNOSIS — L299 Pruritus, unspecified: Secondary | ICD-10-CM | POA: Diagnosis not present

## 2022-06-21 ENCOUNTER — Emergency Department (HOSPITAL_COMMUNITY)
Admission: EM | Admit: 2022-06-21 | Discharge: 2022-06-22 | Disposition: A | Discharge status: Home / Self Care | Payer: Medicare Other | Attending: Emergency Medicine | Admitting: Emergency Medicine

## 2022-06-21 DIAGNOSIS — I5030 Unspecified diastolic (congestive) heart failure: Secondary | ICD-10-CM | POA: Diagnosis not present

## 2022-06-21 DIAGNOSIS — W1830XA Fall on same level, unspecified, initial encounter: Secondary | ICD-10-CM | POA: Diagnosis not present

## 2022-06-21 DIAGNOSIS — Z79899 Other long term (current) drug therapy: Secondary | ICD-10-CM | POA: Diagnosis not present

## 2022-06-21 DIAGNOSIS — Z7902 Long term (current) use of antithrombotics/antiplatelets: Secondary | ICD-10-CM | POA: Insufficient documentation

## 2022-06-21 DIAGNOSIS — N186 End stage renal disease: Secondary | ICD-10-CM | POA: Diagnosis not present

## 2022-06-21 DIAGNOSIS — M79603 Pain in arm, unspecified: Secondary | ICD-10-CM | POA: Diagnosis not present

## 2022-06-21 DIAGNOSIS — R0902 Hypoxemia: Secondary | ICD-10-CM | POA: Diagnosis not present

## 2022-06-21 DIAGNOSIS — S40011A Contusion of right shoulder, initial encounter: Secondary | ICD-10-CM | POA: Insufficient documentation

## 2022-06-21 DIAGNOSIS — W19XXXA Unspecified fall, initial encounter: Secondary | ICD-10-CM

## 2022-06-21 DIAGNOSIS — I2510 Atherosclerotic heart disease of native coronary artery without angina pectoris: Secondary | ICD-10-CM | POA: Diagnosis not present

## 2022-06-21 DIAGNOSIS — I10 Essential (primary) hypertension: Secondary | ICD-10-CM | POA: Diagnosis not present

## 2022-06-21 DIAGNOSIS — S0990XA Unspecified injury of head, initial encounter: Secondary | ICD-10-CM | POA: Diagnosis not present

## 2022-06-21 DIAGNOSIS — I132 Hypertensive heart and chronic kidney disease with heart failure and with stage 5 chronic kidney disease, or end stage renal disease: Secondary | ICD-10-CM | POA: Diagnosis not present

## 2022-06-21 DIAGNOSIS — S4991XA Unspecified injury of right shoulder and upper arm, initial encounter: Secondary | ICD-10-CM | POA: Diagnosis present

## 2022-06-21 DIAGNOSIS — Z043 Encounter for examination and observation following other accident: Secondary | ICD-10-CM | POA: Diagnosis not present

## 2022-06-21 DIAGNOSIS — R0602 Shortness of breath: Secondary | ICD-10-CM | POA: Diagnosis not present

## 2022-06-21 DIAGNOSIS — Z992 Dependence on renal dialysis: Secondary | ICD-10-CM | POA: Diagnosis not present

## 2022-06-21 DIAGNOSIS — I6782 Cerebral ischemia: Secondary | ICD-10-CM | POA: Diagnosis not present

## 2022-06-21 DIAGNOSIS — G319 Degenerative disease of nervous system, unspecified: Secondary | ICD-10-CM | POA: Diagnosis not present

## 2022-06-22 ENCOUNTER — Emergency Department (HOSPITAL_COMMUNITY): Payer: Medicare Other

## 2022-06-22 ENCOUNTER — Encounter (HOSPITAL_COMMUNITY): Payer: Self-pay | Admitting: Emergency Medicine

## 2022-06-22 ENCOUNTER — Other Ambulatory Visit: Payer: Self-pay

## 2022-06-22 DIAGNOSIS — S0990XA Unspecified injury of head, initial encounter: Secondary | ICD-10-CM | POA: Diagnosis not present

## 2022-06-22 DIAGNOSIS — Z992 Dependence on renal dialysis: Secondary | ICD-10-CM | POA: Diagnosis not present

## 2022-06-22 DIAGNOSIS — D631 Anemia in chronic kidney disease: Secondary | ICD-10-CM | POA: Diagnosis not present

## 2022-06-22 DIAGNOSIS — Z043 Encounter for examination and observation following other accident: Secondary | ICD-10-CM | POA: Diagnosis not present

## 2022-06-22 DIAGNOSIS — I6782 Cerebral ischemia: Secondary | ICD-10-CM | POA: Diagnosis not present

## 2022-06-22 DIAGNOSIS — N2581 Secondary hyperparathyroidism of renal origin: Secondary | ICD-10-CM | POA: Diagnosis not present

## 2022-06-22 DIAGNOSIS — R197 Diarrhea, unspecified: Secondary | ICD-10-CM | POA: Diagnosis not present

## 2022-06-22 DIAGNOSIS — G319 Degenerative disease of nervous system, unspecified: Secondary | ICD-10-CM | POA: Diagnosis not present

## 2022-06-22 DIAGNOSIS — L299 Pruritus, unspecified: Secondary | ICD-10-CM | POA: Diagnosis not present

## 2022-06-22 DIAGNOSIS — N186 End stage renal disease: Secondary | ICD-10-CM | POA: Diagnosis not present

## 2022-06-22 NOTE — ED Notes (Signed)
Patient transported to CT 

## 2022-06-22 NOTE — ED Notes (Signed)
MD Roxanne Mins aware of HR in the low 100's.

## 2022-06-22 NOTE — ED Provider Triage Note (Signed)
Emergency Medicine Provider Triage Evaluation Note  [name] , a 71 y.o. female  was evaluated in triage.  Pt complains of fall.  She was at home when her feet got tangled up in her walker causing her to fall onto right arm.  Denies head injury or LOC.  Has pain in right elbow and upper arm.  Denies any other injuries or areas of pain.  Review of Systems  Positive: Arm pain Negative: fever  Physical Exam  There were no vitals taken for this visit.  Gen:   Awake, no distress   Resp:  Normal effort  MSK:   Moves extremities without difficulty  Other:  Tender along right elbow and upper arm, fistula present RUE with bandage in place  Medical Decision Making  Medically screening exam initiated at 12:01 AM.  Appropriate orders placed.  [name] was informed that the remainder of the evaluation will be completed by another provider, this initial triage assessment does not replace that evaluation, and the importance of remaining in the ED until their evaluation is complete.  Right arm pain s/p fall.  X-rays ordered.

## 2022-06-22 NOTE — ED Notes (Signed)
Patient verbalizes understanding of discharge instructions. Opportunity for questioning and answers were provided. Armband removed by staff, pt discharged from ED via [facility] to go home.    

## 2022-06-22 NOTE — ED Provider Notes (Signed)
[facility] EMERGENCY DEPARTMENT Provider Note   CSN: [id number] Arrival date & time: 06/21/22  2357     History  Chief Complaint  Patient presents with   Fall / Arm Pain     [name] is a 71 y.o. female.  The history is provided by the patient.  She has history of hypertension, hyperlipidemia, end-stage renal disease on hemodialysis, stroke, coronary artery disease, diastolic heart failure and comes in following a fall.  She states that she landed on her right shoulder, denies other injury.  She denies hitting her head, but she does take clopidogrel.  She denies loss of consciousness.   Home Medications Prior to Admission medications   Medication Sig Start Date End Date Taking? Authorizing Provider  acetaminophen (TYLENOL) 500 MG tablet Take 1 tablet (500 mg total) by mouth every 6 (six) hours as needed. Patient taking differently: Take 500 mg by mouth every 6 (six) hours as needed (pain). 09/05/18   Law, Bea Graff, PA-C  albuterol (PROVENTIL) (2.5 MG/3ML) 0.083% nebulizer solution Take 3 mLs (2.5 mg total) by nebulization every 4 (four) hours as needed for wheezing. Patient taking differently: Take 2.5 mg by nebulization every 6 (six) hours as needed for wheezing. 05/04/22   Elgergawy, Silver Huguenin, MD  albuterol (VENTOLIN HFA) 108 (90 Base) MCG/ACT inhaler Inhale 1-2 puffs into the lungs every 6 (six) hours as needed for wheezing or shortness of breath. Patient taking differently: Inhale 2 puffs into the lungs every 6 (six) hours as needed for shortness of breath. 01/11/22   Mesner, Corene Cornea, MD  atorvastatin (LIPITOR) 80 MG tablet Take 80 mg by mouth at bedtime. 03/25/22   [provider]  B Complex-C-Folic Acid (DIALYVITE 235) 0.8 MG WAFR Take 1 tablet by mouth every morning. 01/28/22   [provider]  clopidogrel (PLAVIX) 75 MG tablet Take 1 tablet (75 mg total) by mouth daily. Patient taking differently: Take 75 mg by mouth at bedtime. 03/26/22  03/26/23  Marty Heck, MD  doxycycline (VIBRAMYCIN) 100 MG capsule Take 1 capsule (100 mg total) by mouth 2 (two) times daily. 05/27/22   Mercy Riding, MD  escitalopram (LEXAPRO) 10 MG tablet Take 10 mg by mouth every morning. 03/25/22   [provider]  hydrOXYzine (ATARAX) 25 MG tablet Take 1 tablet (25 mg total) by mouth every 8 (eight) hours as needed for itching. 05/21/22   Mercy Riding, MD  midodrine (PROAMATINE) 10 MG tablet Take 1 tablet (10 mg total) by mouth 3 (three) times daily with meals. 05/21/22   Mercy Riding, MD  Nutritional Supplements (NEPRO) LIQD Take 237 mLs by mouth 2 (two) times daily between meals.    [provider]  ondansetron (ZOFRAN) 8 MG tablet Take 8 mg by mouth every 12 (twelve) hours as needed for nausea or vomiting.    [provider]  OVER THE COUNTER MEDICATION Take 1 tablet by mouth every morning. Folic acid - vitamin B complex - vitamin C - selenium - zinc 3 mg    [provider]  pantoprazole (PROTONIX) 40 MG tablet Take 1 tablet (40 mg total) by mouth daily. 08/18/21   Nita Sells, MD  sevelamer (RENAGEL) 800 MG tablet Take 1,600 mg by mouth 3 (three) times daily with meals. 03/24/22   [provider]  zolpidem (AMBIEN) 5 MG tablet Take 1 tablet (5 mg total) by mouth at bedtime as needed for up to 10 days for sleep (Insomnia). 05/21/22 05/31/22  Mercy Riding, MD      Allergies    Sulfa antibiotics and Adhesive [tape]    Review of Systems   Review of Systems  All other systems reviewed and are negative.   Physical Exam Updated Vital Signs BP (!) 153/69   Pulse (!) 108   Resp 19   SpO2 100%  Physical Exam Vitals and nursing note reviewed.   71 year old female, resting comfortably and in no acute distress. Vital signs are significant for elevated heart rate and blood pressure. Oxygen saturation is 100%, which is normal. Head is normocephalic and atraumatic. PERRLA, EOMI. Oropharynx is  clear. Neck is nontender and supple without adenopathy or JVD. Back is nontender and there is no CVA tenderness. Lungs are clear without rales, wheezes, or rhonchi. Chest is nontender. Heart has regular rate and rhythm with 2/6 systolic flow murmur. Abdomen is soft, flat, nontender without masses or hepatosplenomegaly and peristalsis is normoactive. Extremities have no cyanosis or edema, full range of motion is present.  AV fistulas are present in both arms with thrills present. Skin is warm and dry without rash. Neurologic: Mental status is normal, cranial nerves are intact, moves all extremities equally.  ED Results / Procedures / Treatments    Radiology CT Head Wo Contrast  Result Date: 06/22/2022 CLINICAL DATA:  71 year old female with history of minor head trauma. EXAM: CT HEAD WITHOUT CONTRAST TECHNIQUE: Contiguous axial images were obtained from the base of the skull through the vertex without intravenous contrast. RADIATION DOSE REDUCTION: This exam was performed according to the departmental dose-optimization program which includes automated exposure control, adjustment of the mA and/or kV according to patient size and/or use of iterative reconstruction technique. COMPARISON:  Head CT 05/20/2022. FINDINGS: Brain: Moderate cerebral and mild cerebellar atrophy. Patchy and confluent areas of decreased attenuation are noted throughout the deep and periventricular white matter of the cerebral hemispheres bilaterally, compatible with chronic microvascular ischemic disease. No evidence of acute infarction, hemorrhage, hydrocephalus, extra-axial collection or mass lesion/mass effect. Vascular: No hyperdense vessel or unexpected calcification. Skull: Normal. Negative for fracture or focal lesion. Sinuses/Orbits: No acute finding. Other: None. IMPRESSION: 1. No acute intracranial abnormalities. 2. Moderate cerebral and mild cerebellar atrophy with extensive chronic microvascular ischemic changes in  the cerebral white matter, as above. Electronically Signed   By: Vinnie Langton M.D.   On: 06/22/2022 06:06   DG Elbow Complete Right  Result Date: 06/22/2022 CLINICAL DATA:  Fall at home. EXAM: RIGHT ELBOW - COMPLETE 3+ VIEW; RIGHT HUMERUS - 2+ VIEW COMPARISON:  None. FINDINGS: There is no evidence of fracture, dislocation, or joint effusion. Mild degenerative changes are present about the elbow. Extensive vascular calcifications are noted in the soft tissues. IMPRESSION: No acute fracture or dislocation. Electronically Signed   By: Brett Fairy M.D.   On: 06/22/2022 00:37   DG Humerus Right  Result Date: 06/22/2022 CLINICAL DATA:  Fall at home. EXAM: RIGHT ELBOW - COMPLETE 3+ VIEW; RIGHT HUMERUS - 2+ VIEW COMPARISON:  None. FINDINGS: There is no evidence of fracture, dislocation, or joint effusion. Mild degenerative changes are present about the elbow. Extensive vascular calcifications are noted in the soft tissues. IMPRESSION: No acute fracture or dislocation. Electronically Signed   By: Brett Fairy M.D.   On: 06/22/2022 00:37    Procedures Procedures  Cardiac monitor shows sinus tachycardia, per my interpretation.  Medications Ordered in ED Medications - No data to display  ED Course/ Medical Decision Making/ A&P  Medical Decision Making Amount and/or Complexity of Data Reviewed Radiology: ordered.   Fall with injury to right shoulder but benign exam.  X-rays of the right humerus and elbow showed no fracture.  I have independently viewed the images, and agree with radiologist's interpretation.  Because of use of antiplatelet agents, I have ordered a CT of the head to rule out intracranial injury.  Old records are reviewed, and she has no relevant past visits.  CT scan shows no intracranial injury.  I have independently viewed the images, and agree with radiologist's interpretation.  She is discharged with instructions to apply ice, use over-the-counter  acetaminophen as needed for pain.  Final Clinical Impression(s) / ED Diagnoses Final diagnoses:  Fall, initial encounter  Contusion of right shoulder, initial encounter  End-stage renal disease on hemodialysis [facility])    Rx / DC Orders ED Discharge Orders     None         Delora Fuel, MD 27/74/12 [phone]

## 2022-06-22 NOTE — Discharge Instructions (Signed)
Apply ice for 30 minutes at a time, 4 times a day.  You may take acetaminophen as needed for pain.  Make sure to go for your dialysis sessions as scheduled.

## 2022-06-22 NOTE — ED Notes (Signed)
[facility] Called 

## 2022-06-22 NOTE — ED Triage Notes (Signed)
Patient arrived with [facility] from home tripped and fell forward while using her walker this evening , denies LOC/alert and oriented , reports pain at right upper arm /no deformity .

## 2022-06-22 NOTE — ED Notes (Signed)
Pt desat to upper 80's while sleeping. Pt placed on 2 L . O2 sat now 96%.

## 2022-06-23 DIAGNOSIS — I252 Old myocardial infarction: Secondary | ICD-10-CM | POA: Diagnosis not present

## 2022-06-23 DIAGNOSIS — F32A Depression, unspecified: Secondary | ICD-10-CM | POA: Diagnosis not present

## 2022-06-23 DIAGNOSIS — I2510 Atherosclerotic heart disease of native coronary artery without angina pectoris: Secondary | ICD-10-CM | POA: Diagnosis not present

## 2022-06-23 DIAGNOSIS — I132 Hypertensive heart and chronic kidney disease with heart failure and with stage 5 chronic kidney disease, or end stage renal disease: Secondary | ICD-10-CM | POA: Diagnosis not present

## 2022-06-23 DIAGNOSIS — Z992 Dependence on renal dialysis: Secondary | ICD-10-CM | POA: Diagnosis not present

## 2022-06-23 DIAGNOSIS — Z9181 History of falling: Secondary | ICD-10-CM | POA: Diagnosis not present

## 2022-06-23 DIAGNOSIS — Z7902 Long term (current) use of antithrombotics/antiplatelets: Secondary | ICD-10-CM | POA: Diagnosis not present

## 2022-06-23 DIAGNOSIS — N186 End stage renal disease: Secondary | ICD-10-CM | POA: Diagnosis not present

## 2022-06-23 DIAGNOSIS — I509 Heart failure, unspecified: Secondary | ICD-10-CM | POA: Diagnosis not present

## 2022-06-23 DIAGNOSIS — Z8673 Personal history of transient ischemic attack (TIA), and cerebral infarction without residual deficits: Secondary | ICD-10-CM | POA: Diagnosis not present

## 2022-06-24 DIAGNOSIS — Z992 Dependence on renal dialysis: Secondary | ICD-10-CM | POA: Diagnosis not present

## 2022-06-24 DIAGNOSIS — L299 Pruritus, unspecified: Secondary | ICD-10-CM | POA: Diagnosis not present

## 2022-06-24 DIAGNOSIS — R197 Diarrhea, unspecified: Secondary | ICD-10-CM | POA: Diagnosis not present

## 2022-06-24 DIAGNOSIS — D631 Anemia in chronic kidney disease: Secondary | ICD-10-CM | POA: Diagnosis not present

## 2022-06-24 DIAGNOSIS — N186 End stage renal disease: Secondary | ICD-10-CM | POA: Diagnosis not present

## 2022-06-24 DIAGNOSIS — N2581 Secondary hyperparathyroidism of renal origin: Secondary | ICD-10-CM | POA: Diagnosis not present

## 2022-06-25 ENCOUNTER — Telehealth: Payer: Self-pay

## 2022-06-25 ENCOUNTER — Inpatient Hospital Stay: Payer: Medicare Other | Admitting: Internal Medicine

## 2022-06-25 NOTE — Telephone Encounter (Signed)
Called patient to see if she would be able to make it to today's appointment, no answer. Left HIPAA compliant voicemail requesting callback.   Laquesha Holcomb D Nassir Neidert, RN  

## 2022-06-25 NOTE — Telephone Encounter (Signed)
Patient's returned call. State it is best to contact patient's son POA [name] at [phone] as he can also coordinate transportation for appointment as well. Sister will text him to let him know our office will be contacting him so he will answer the phone.  [name]

## 2022-06-25 NOTE — Telephone Encounter (Signed)
Spoke with patient's son and rescheduled for 07/02/22.  Beryle Flock, RN

## 2022-06-26 DIAGNOSIS — N186 End stage renal disease: Secondary | ICD-10-CM | POA: Diagnosis not present

## 2022-06-26 DIAGNOSIS — Z992 Dependence on renal dialysis: Secondary | ICD-10-CM | POA: Diagnosis not present

## 2022-06-26 DIAGNOSIS — N2581 Secondary hyperparathyroidism of renal origin: Secondary | ICD-10-CM | POA: Diagnosis not present

## 2022-06-26 DIAGNOSIS — D631 Anemia in chronic kidney disease: Secondary | ICD-10-CM | POA: Diagnosis not present

## 2022-06-26 DIAGNOSIS — L299 Pruritus, unspecified: Secondary | ICD-10-CM | POA: Diagnosis not present

## 2022-06-26 DIAGNOSIS — R197 Diarrhea, unspecified: Secondary | ICD-10-CM | POA: Diagnosis not present

## 2022-06-27 IMAGING — DX DG CHEST 2V
2 series · 2 of 2 positions shown · non-contrast
Comparison: 11/07/2021

CLINICAL DATA: Dyspnea

EXAM:
CHEST - 2 VIEW

[chest lat]
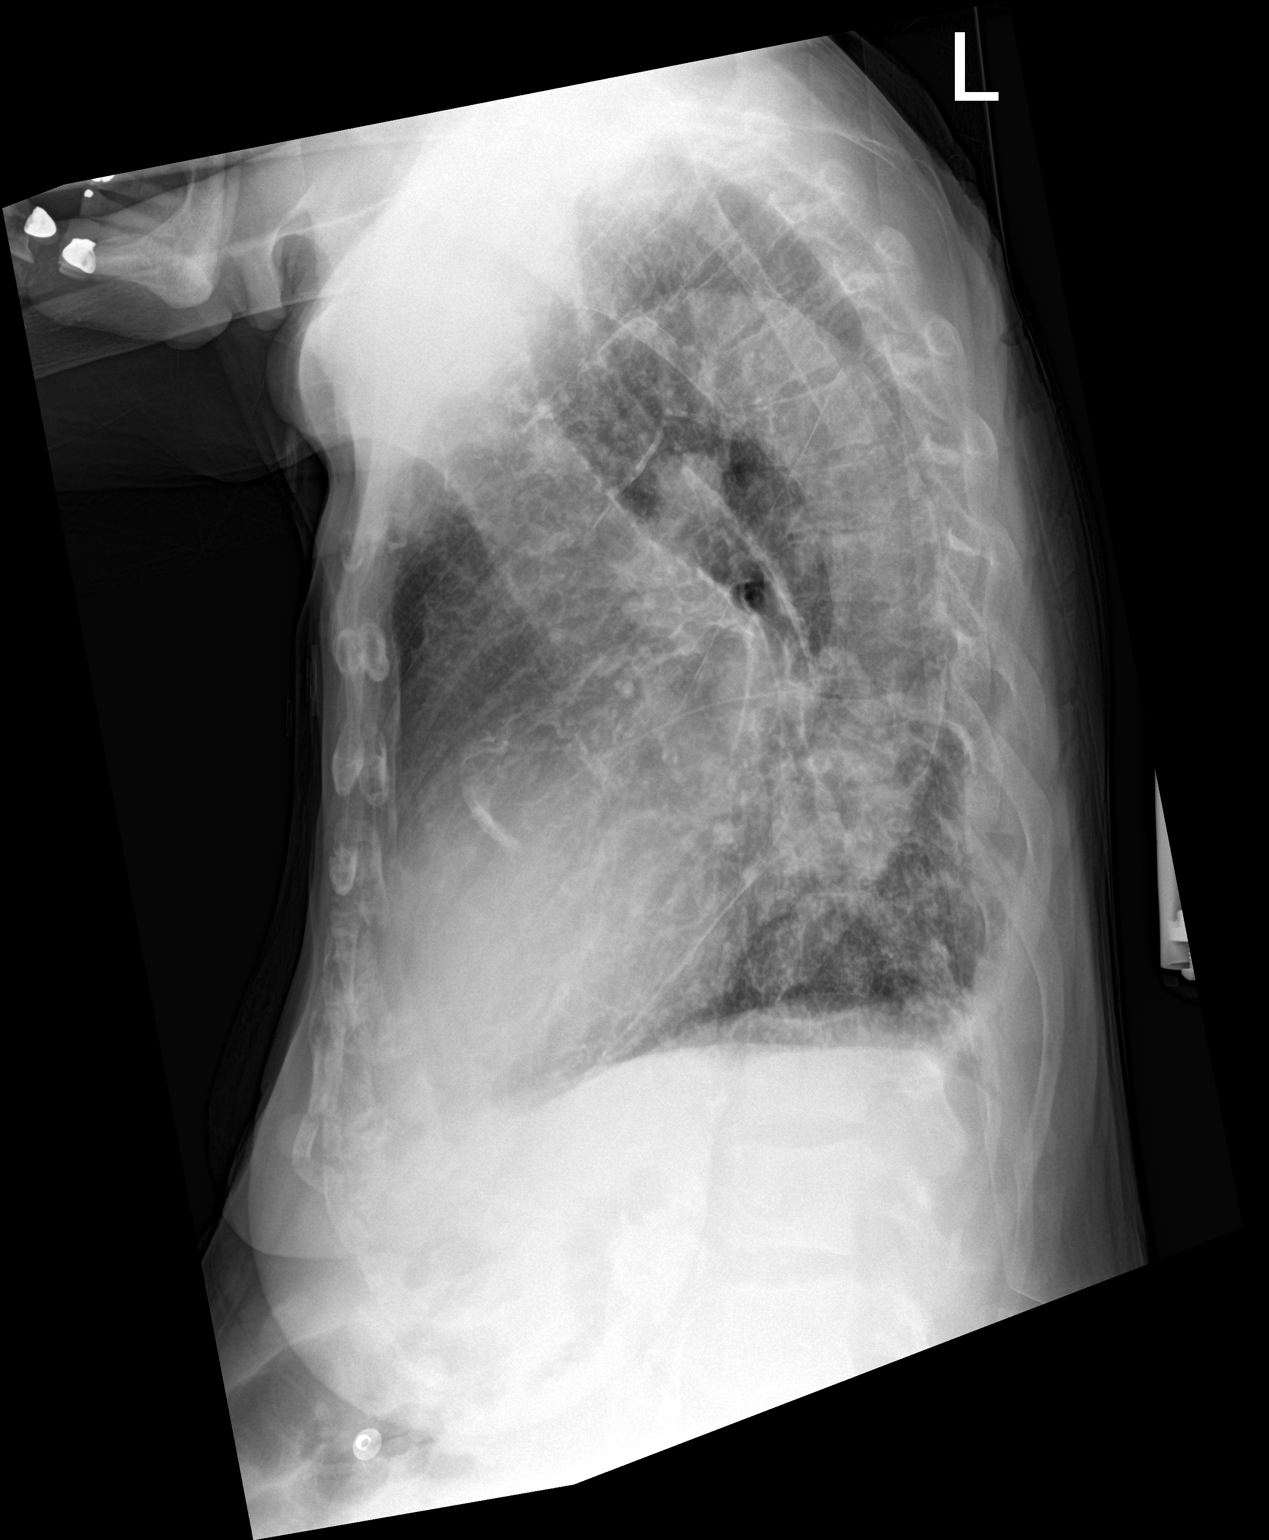

[chest ap]
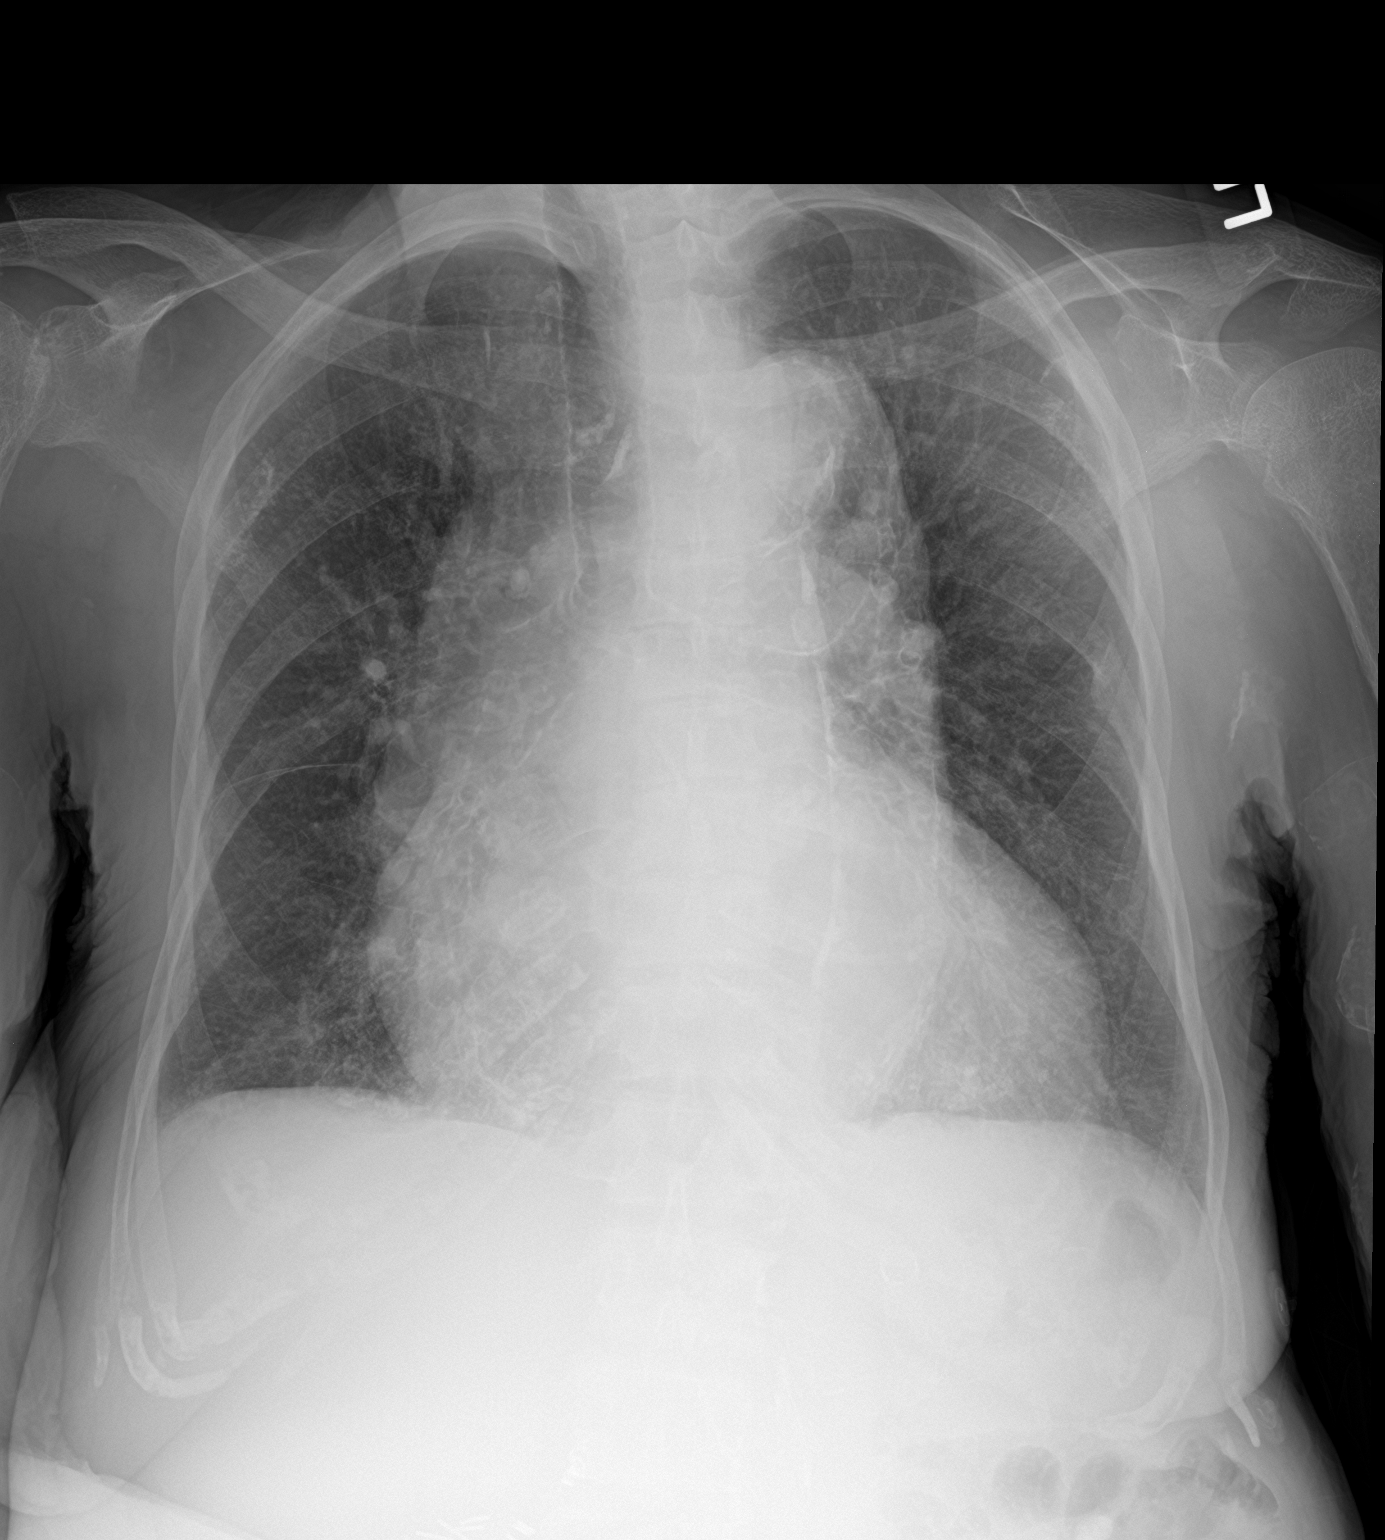

[2 of 2 positions shown; findings below may reference images not displayed]

FINDINGS: Lungs are clear. No pneumothorax or pleural effusion. Mild
cardiomegaly is stable. The thoracic aorta is tortuous and ectatic,
but unchanged from prior examination. Pulmonary vascularity is
normal. No acute bone abnormality.
IMPRESSION: Stable cardiomegaly.  Resolved pulmonary edema.

## 2022-06-29 DIAGNOSIS — Z992 Dependence on renal dialysis: Secondary | ICD-10-CM | POA: Diagnosis not present

## 2022-06-29 DIAGNOSIS — N2581 Secondary hyperparathyroidism of renal origin: Secondary | ICD-10-CM | POA: Diagnosis not present

## 2022-06-29 DIAGNOSIS — D631 Anemia in chronic kidney disease: Secondary | ICD-10-CM | POA: Diagnosis not present

## 2022-06-29 DIAGNOSIS — L299 Pruritus, unspecified: Secondary | ICD-10-CM | POA: Diagnosis not present

## 2022-06-29 DIAGNOSIS — R197 Diarrhea, unspecified: Secondary | ICD-10-CM | POA: Diagnosis not present

## 2022-06-29 DIAGNOSIS — N186 End stage renal disease: Secondary | ICD-10-CM | POA: Diagnosis not present

## 2022-06-29 IMAGING — CR DG CHEST 2V
2 series · 2 of 2 positions shown · non-contrast
Comparison: Chest radiograph 11/18/2021

CLINICAL DATA: Shortness of breath

EXAM:
CHEST - 2 VIEW

[chest pa]
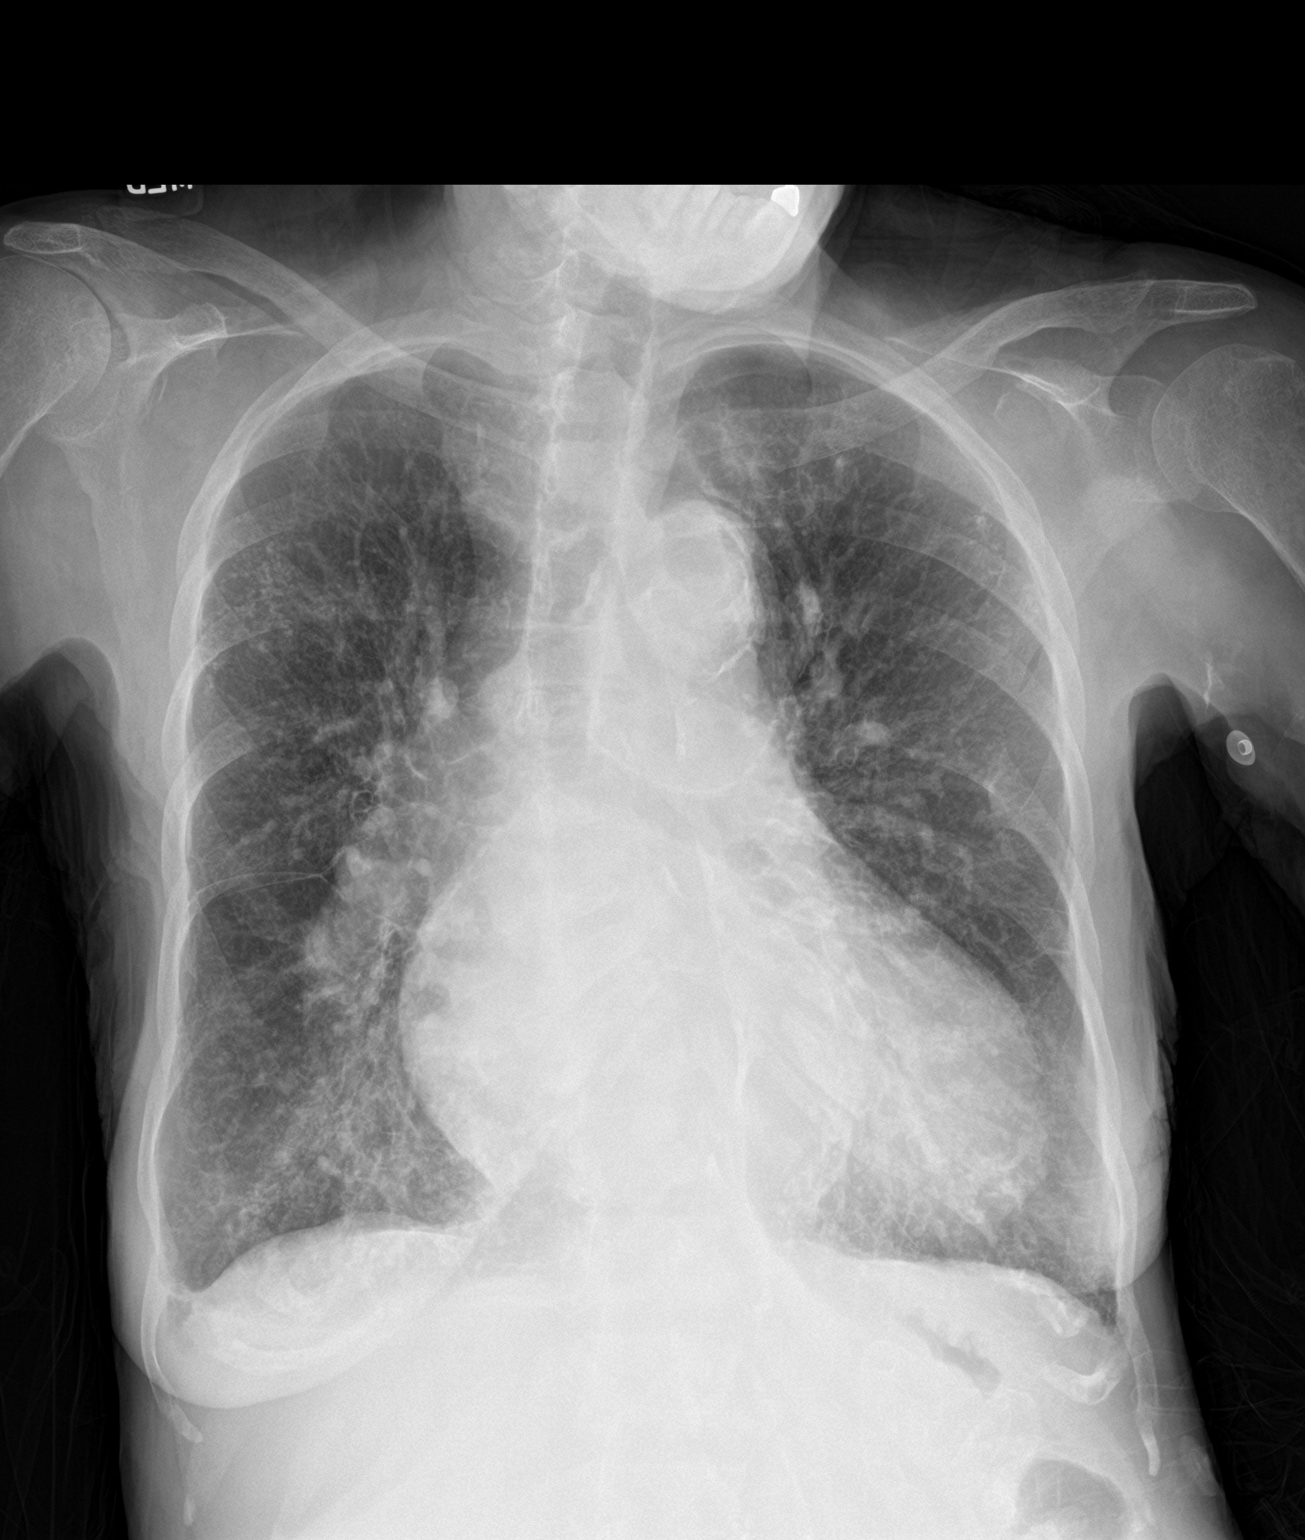

[chest lat]
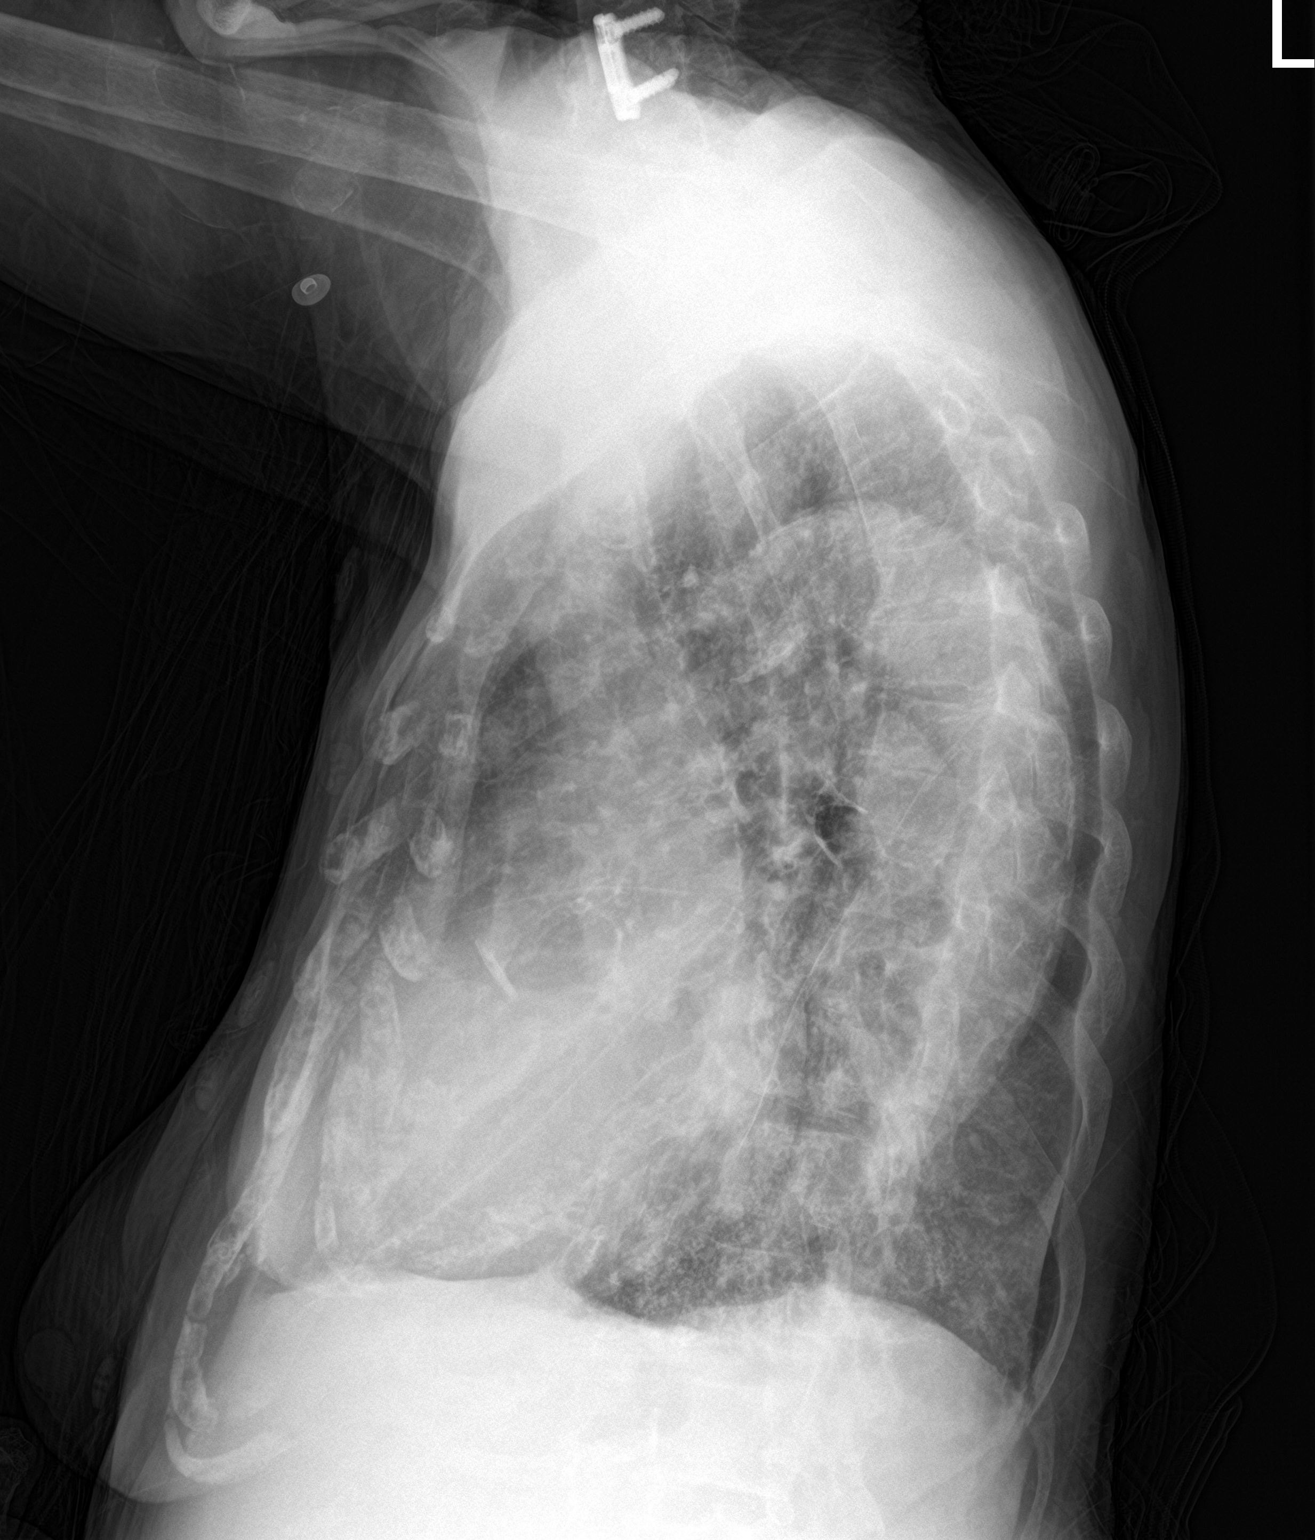

[2 of 2 positions shown; findings below may reference images not displayed]

FINDINGS: The heart is enlarged, unchanged. The mediastinal contours are
stable.

There is worsened vascular congestion without overt pulmonary edema.
The pulmonary arteries are prominent suggesting pulmonary
hypertension. A cluster of small nodular opacities in the right
upper lobe are unchanged, chronic. There is no new focal
consolidation. There is no pleural effusion or pneumothorax.

The bones are stable.
IMPRESSION: Slightly worsened vascular congestion without definite overt
pulmonary edema since 11/18/2021. No focal consolidation or pleural
effusion.

## 2022-07-01 DIAGNOSIS — N186 End stage renal disease: Secondary | ICD-10-CM | POA: Diagnosis not present

## 2022-07-01 DIAGNOSIS — R197 Diarrhea, unspecified: Secondary | ICD-10-CM | POA: Diagnosis not present

## 2022-07-01 DIAGNOSIS — D631 Anemia in chronic kidney disease: Secondary | ICD-10-CM | POA: Diagnosis not present

## 2022-07-01 DIAGNOSIS — N2581 Secondary hyperparathyroidism of renal origin: Secondary | ICD-10-CM | POA: Diagnosis not present

## 2022-07-01 DIAGNOSIS — Z992 Dependence on renal dialysis: Secondary | ICD-10-CM | POA: Diagnosis not present

## 2022-07-01 DIAGNOSIS — L299 Pruritus, unspecified: Secondary | ICD-10-CM | POA: Diagnosis not present

## 2022-07-02 ENCOUNTER — Ambulatory Visit (INDEPENDENT_AMBULATORY_CARE_PROVIDER_SITE_OTHER): Payer: Medicare Other | Admitting: Internal Medicine

## 2022-07-02 ENCOUNTER — Other Ambulatory Visit: Payer: Self-pay

## 2022-07-02 ENCOUNTER — Encounter: Payer: Self-pay | Admitting: Internal Medicine

## 2022-07-02 VITALS — BP 125/84 | HR 88 | Temp 97.8°F | Wt 99.0 lb

## 2022-07-02 DIAGNOSIS — R7881 Bacteremia: Secondary | ICD-10-CM

## 2022-07-02 MED ORDER — DOXYCYCLINE HYCLATE 100 MG PO TABS: 100.0000 mg | ORAL_TABLET | Freq: Two times a day (BID) | ORAL | 5 refills | Status: DC

## 2022-07-02 MED ORDER — CEPHALEXIN 500 MG PO CAPS
500.0000 mg | ORAL_CAPSULE | Freq: Every evening | ORAL | 5 refills | Status: DC
Start: 2022-07-02 — End: 2022-07-02

## 2022-07-02 NOTE — Progress Notes (Signed)
Patient Active Problem List   Diagnosis Date Noted   Dermatitis 05/10/2022   Physical deconditioning 05/07/2022   Hypervolemia associated with renal insufficiency 05/06/2022   DNR (do not resuscitate) 05/06/2022   Sepsis ([place])    MSSA bacteremia    Vascular graft infection  and MSSA bacteremia 04/26/2022   Arteriovenous fistula, acquired ([place]) 44/12/270   Cyclical vomiting syndrome 02/19/2022   Dysphagia 02/19/2022   Anemia of chronic renal failure 02/19/2022   Hypertensive heart and chronic kidney disease with heart failure and with stage 5 chronic kidney disease, or end stage renal disease ([place]) 02/19/2022   Mild cognitive impairment 02/19/2022   Major depression, single episode 02/19/2022   Fall at home, initial encounter 01/23/2022   Acute on chronic diastolic CHF 53/66/4403   GERD (gastroesophageal reflux disease)    Other disorders of calcium metabolism 10/17/2021   Protein-calorie malnutrition, severe 08/12/2021   Acute respiratory failure with hypoxia ([place]) 08/06/2021   Major depressive disorder, recurrent episode, moderate (HCC) 02/23/2021   Prolonged QT interval 02/21/2021   Allergy, unspecified, initial encounter 08/08/2020   NSTEMI 05/14/2020   Hypotension 03/16/2020   Ventricular tachycardia ([place]) 03/15/2020   Seizure ([place]) 47/42/5956   Complication of vascular access for dialysis 12/21/2019   Breakdown (mechanical) of surgically created arteriovenous fistula, initial encounter ([place]) 12/18/2019   Weakness 10/11/2019   Aneurysm artery, 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 difficile diarrhea 04/17/2019   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   Primary generalized  (osteo)arthritis 04/17/2019   Pure hypercholesterolemia, unspecified 04/17/2019   Secondary hyperparathyroidism of renal origin ([place]) 04/17/2019   Gastro-esophageal reflux disease without esophagitis 04/17/2019   Essential (primary) hypertension 04/17/2019   Hyperlipidemia, unspecified 04/17/2019   Transient cerebral ischemic attack, unspecified 04/17/2019   Prolonged Q-T interval on ECG 02/24/2017   Malnutrition of moderate degree 12/24/2016   Problem with dialysis access ([place]) 12/21/2016   Neurologic abnormality 11/19/2015   Anxiety 11/19/2015   Insomnia 11/19/2015   Gait instability    Dizziness 05/09/2015   Ataxia 05/09/2015   H/O: CVA (cerebrovascular accident) 05/09/2015   Left facial numbness 05/09/2015   Left leg numbness 05/09/2015   Hyperlipidemia    SOB (shortness of breath) 04/01/2013   HTN (hypertension) 04/01/2013   End-stage renal disease on hemodialysis ([place]) 11/07/2012   Dyspnea 12/31/2011    Patient's Medications  New Prescriptions   No medications on file  Previous Medications   ACETAMINOPHEN (TYLENOL) 500 MG TABLET    Take 1 tablet (500 mg total) by mouth every 6 (six) hours as needed.   ALBUTEROL (PROVENTIL) (2.5 MG/3ML) 0.083% NEBULIZER SOLUTION    Take 3 mLs (2.5 mg total) by nebulization every 4 (four) hours as needed for wheezing.   ALBUTEROL (VENTOLIN HFA) 108 (90 BASE) MCG/ACT INHALER    Inhale 1-2 puffs into the lungs every 6 (six) hours as needed for wheezing or shortness of breath.   ATORVASTATIN (LIPITOR) 80 MG TABLET    Take 80 mg by mouth at bedtime.   B COMPLEX-C-FOLIC ACID (DIALYVITE 387) 0.8 MG WAFR    Take 1 tablet by mouth every morning.   CLOPIDOGREL (PLAVIX) 75 MG TABLET    Take 1 tablet (75 mg total) by mouth daily.  DOXYCYCLINE (VIBRAMYCIN) 100 MG CAPSULE    Take 1 capsule (100 mg total) by mouth 2 (two) times daily.   ESCITALOPRAM (LEXAPRO) 10 MG TABLET    Take 10 mg by mouth every morning.   HYDROXYZINE (ATARAX) 25 MG TABLET    Take 1  tablet (25 mg total) by mouth every 8 (eight) hours as needed for itching.   MIDODRINE (PROAMATINE) 10 MG TABLET    Take 1 tablet (10 mg total) by mouth 3 (three) times daily with meals.   NUTRITIONAL SUPPLEMENTS (NEPRO) LIQD    Take 237 mLs by mouth 2 (two) times daily between meals.   ONDANSETRON (ZOFRAN) 8 MG TABLET    Take 8 mg by mouth every 12 (twelve) hours as needed for nausea or vomiting.   OVER THE COUNTER MEDICATION    Take 1 tablet by mouth every morning. Folic acid - vitamin B complex - vitamin C - selenium - zinc 3 mg   PANTOPRAZOLE (PROTONIX) 40 MG TABLET    Take 1 tablet (40 mg total) by mouth daily.   SEVELAMER (RENAGEL) 800 MG TABLET    Take 1,600 mg by mouth 3 (three) times daily with meals.   ZOLPIDEM (AMBIEN) 5 MG TABLET    Take 1 tablet (5 mg total) by mouth at bedtime as needed for up to 10 days for sleep (Insomnia).  Modified Medications   No medications on file  Discontinued Medications   No medications on file    Subjective: 71 YF with PMHX as below presents for hospital f.u MSSA bacteremia and cdiff. Pt has ESRD on iHD RUE AVF with hospitalization 5/14-5/18 for MSSA bacteremia 2/2 RUE graft infection SP excision of segment of RAVG with revision(interposition 77m PTFE graft) 5/15 Cx+ MSSA discharge on cefazolin x 4 weeks(EOT 6/13)followed by PO suppression. [facility]. Found to have c diff diarrhea(Ag/PCR+) with leukocytosis, placed on PO vanc x 10 days(continued to 7 days following completion of antibiotics. After completion on IV abx plan to do doxy 100 mg PO bid for chronic suppression. On 6/2 she underwent exploration Left arm graft and hematoma evacuation.   Today 7/20: Pt missed appt on 7/7. Son present today at visit. She had not started PO doxy. She denies fever, chill, pain at fistula site, N/V/D.  Review of Systems: Review of Systems  All other systems reviewed and are negative.   Past Medical History:  Diagnosis Date    Acute ischemic stroke ([place] 02/23/2017   Acute on chronic diastolic CHF (congestive heart failure) ([place]    Adenomatous polyp of colon 10/2010, 2006, 2015   Anemia in CKD (chronic kidney disease) 11/07/2012   s/p blood transfusion.    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  Had some early rejection then stable function for years, then had slow decline of function and went back on hemodialysis in 2012.  [facility]      GERD (gastroesophageal reflux disease)    GI bleed 2017   felt to be ischemic colitis, last colo 2015   Hyperlipidemia  Hypertension    Neurologic gait dysfunction    Neuromuscular disorder (HCC)    neuropathy hand and legs   NSTEMI (non-ST elevated myocardial infarction) ([place]) 05/14/2020   Osteoporosis    Pneumonia    Pseudoaneurysm of surgical AV fistula (HCC)    left upper arm    Social History   Tobacco Use   Smoking status: Former    Types: Cigarettes    Quit date: 12/31/1991    Years since quitting: 30.5   Smokeless tobacco: Never  Vaping Use   Vaping Use: Never used  Substance Use Topics   Alcohol use: No    Alcohol/week: 0.0 standard drinks of alcohol   Drug use: No    Family History  Problem Relation Age of Onset   Colon cancer Brother    Cancer Brother    Coronary artery disease Mother 57   Hyperlipidemia Mother    Hypertension Mother    Stroke Maternal Aunt    Esophageal cancer Neg Hx    Stomach cancer Neg Hx    Rectal cancer Neg Hx     Allergies  Allergen Reactions   Sulfa Antibiotics Other (See Comments)    Per patient, both parents  allergic-so will not take   Adhesive [Tape] Itching    Health Maintenance  Topic Date Due   TETANUS/TDAP  Never done   Zoster Vaccines- Shingrix (1 of 2) Never done   MAMMOGRAM  Never done   DEXA SCAN  Never done   COLONOSCOPY (Pts 45-30yr Insurance coverage will need to be confirmed)  01/10/2019   COVID-19 Vaccine (4 - Booster for [employer]) 01/30/2021   INFLUENZA VACCINE  07/14/2022   Pneumonia Vaccine 71 Years old  Completed   Hepatitis C Screening  Completed   HPV VACCINES  Aged Out    Objective:  Vitals:   07/02/22 1103  BP: 125/84  Pulse: 88  Temp: 97.8 F (36.6 C)  TempSrc: Temporal  Weight: 99 lb (44.9 kg)   Body mass index is 18.11 kg/m.  Physical Exam Constitutional:      Appearance: Normal appearance.  HENT:     Head: Normocephalic and atraumatic.     Right Ear: Tympanic membrane normal.     Left Ear: Tympanic membrane normal.     Nose: Nose normal.     Mouth/Throat:     Mouth: Mucous membranes are moist.  Eyes:     Extraocular Movements: Extraocular movements intact.     Conjunctiva/sclera: Conjunctivae normal.     Pupils: Pupils are equal, round, and reactive to light.  Cardiovascular:     Rate and Rhythm: Normal rate and regular rhythm.     Heart sounds: No murmur heard.    No friction rub. No gallop.     Comments: RUE AVG Old LUE fistula Pulmonary:     Effort: Pulmonary effort is normal.     Breath sounds: Normal breath sounds.  Abdominal:     General: Abdomen is flat.     Palpations: Abdomen is soft.  Musculoskeletal:        General: Normal range of motion.  Skin:    General: Skin is warm and dry.  Neurological:     General: No focal deficit present.     Mental Status: She is alert and oriented to person, place, and time.  Psychiatric:        Mood and Affect: Mood normal.     Lab Results Lab Results  Component Value Date   WBC 7.2  05/22/2022   HGB 9.6 (L) 05/22/2022   HCT 29.9 (L) 05/22/2022   MCV 103.5 (H) 05/22/2022    PLT 277 05/22/2022    Lab Results  Component Value Date   CREATININE 4.87 (H) 05/22/2022   BUN 30 (H) 05/22/2022   NA 138 05/22/2022   K 5.1 05/22/2022   CL 98 05/22/2022   CO2 25 05/22/2022    Lab Results  Component Value Date   ALT <5 05/20/2022   AST 19 05/20/2022   ALKPHOS 93 05/20/2022   BILITOT 0.6 05/20/2022    Lab Results  Component Value Date   CHOL 191 02/22/2021   HDL 57 02/22/2021   LDLCALC 117 (H) 02/22/2021   TRIG 92 04/28/2022   CHOLHDL 3.4 02/22/2021   Lab Results  Component Value Date   LABRPR Non Reactive 07/13/2018   No results found for: "HIV1RNAQUANT", "HIV1RNAVL", "CD4TABS"   #MSSA bacteremia 2/2 RUE graft infection #SP excision of segment of RAVG with revision(interposition 47m PTFE graft) 5/15 Cx+ MSSA  # C diff diarrhea SP x 4 week sof cefazolin(EOT 6/13) from RAVG revision with graft. She was readmitted for diarrhea(5/24-6/9), noted to have ciff and recived PO vanc x 10 days followed 7 days foll\owing completion of IV abx. She did not start doxy for chronic suppression. Diarrhea resolved. No abdomina pain , fever or chills. No drainage reported for RAVG Plan: -Start doxycyline 100gm PO bid as RUE graft in place -Labs today: blood Cx, cbc,c mp, esr and crp -Follow-up in 375month MaLaurice RecordMD [facility] 07/02/2022, 11:05 AM

## 2022-07-03 DIAGNOSIS — N186 End stage renal disease: Secondary | ICD-10-CM | POA: Diagnosis not present

## 2022-07-03 DIAGNOSIS — L299 Pruritus, unspecified: Secondary | ICD-10-CM | POA: Diagnosis not present

## 2022-07-03 DIAGNOSIS — N2581 Secondary hyperparathyroidism of renal origin: Secondary | ICD-10-CM | POA: Diagnosis not present

## 2022-07-03 DIAGNOSIS — Z992 Dependence on renal dialysis: Secondary | ICD-10-CM | POA: Diagnosis not present

## 2022-07-03 DIAGNOSIS — R197 Diarrhea, unspecified: Secondary | ICD-10-CM | POA: Diagnosis not present

## 2022-07-03 DIAGNOSIS — D631 Anemia in chronic kidney disease: Secondary | ICD-10-CM | POA: Diagnosis not present

## 2022-07-06 DIAGNOSIS — R197 Diarrhea, unspecified: Secondary | ICD-10-CM | POA: Diagnosis not present

## 2022-07-06 DIAGNOSIS — D631 Anemia in chronic kidney disease: Secondary | ICD-10-CM | POA: Diagnosis not present

## 2022-07-06 DIAGNOSIS — L299 Pruritus, unspecified: Secondary | ICD-10-CM | POA: Diagnosis not present

## 2022-07-06 DIAGNOSIS — N186 End stage renal disease: Secondary | ICD-10-CM | POA: Diagnosis not present

## 2022-07-06 DIAGNOSIS — N2581 Secondary hyperparathyroidism of renal origin: Secondary | ICD-10-CM | POA: Diagnosis not present

## 2022-07-06 DIAGNOSIS — Z992 Dependence on renal dialysis: Secondary | ICD-10-CM | POA: Diagnosis not present

## 2022-07-06 IMAGING — DX DG FOOT COMPLETE 3+V*L*
3 series · 3 of 3 positions shown · non-contrast
Comparison: None.

CLINICAL DATA: Chronic bilateral foot pain

EXAM:
LEFT FOOT - COMPLETE 3+ VIEW

[foot ap]
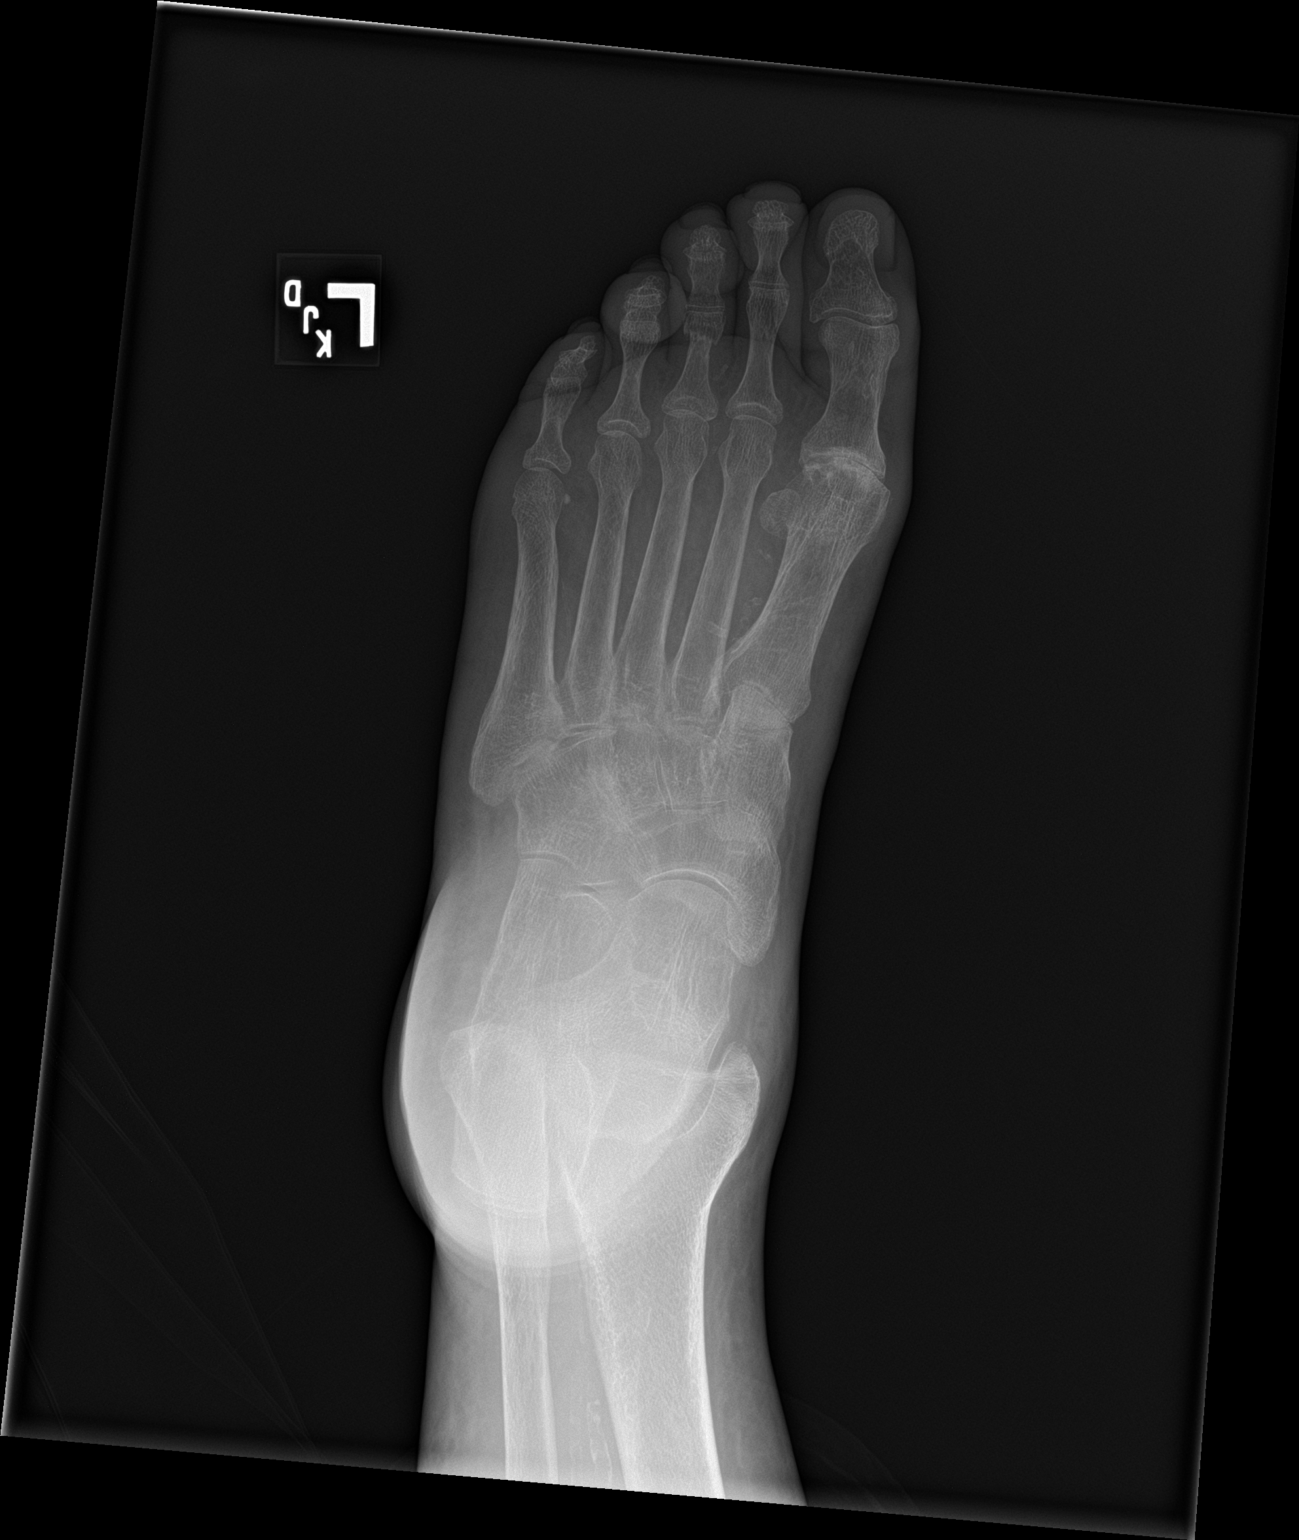

[foot obl]
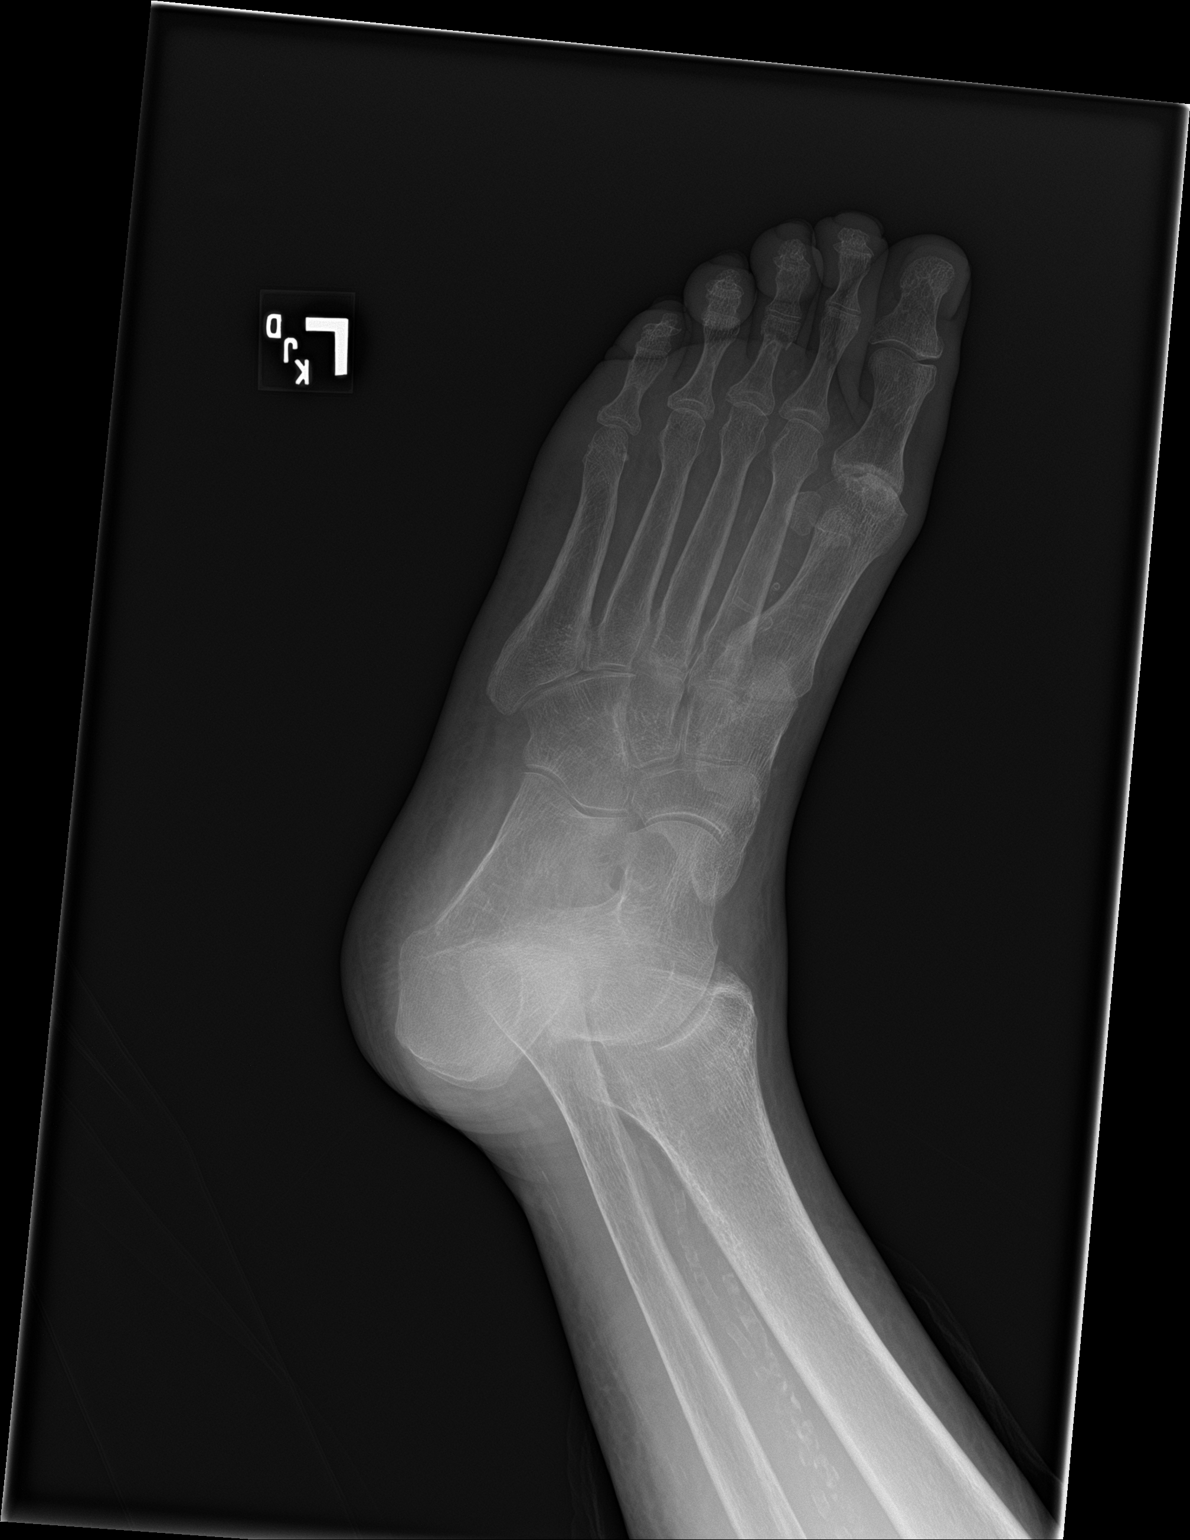

[foot lat]
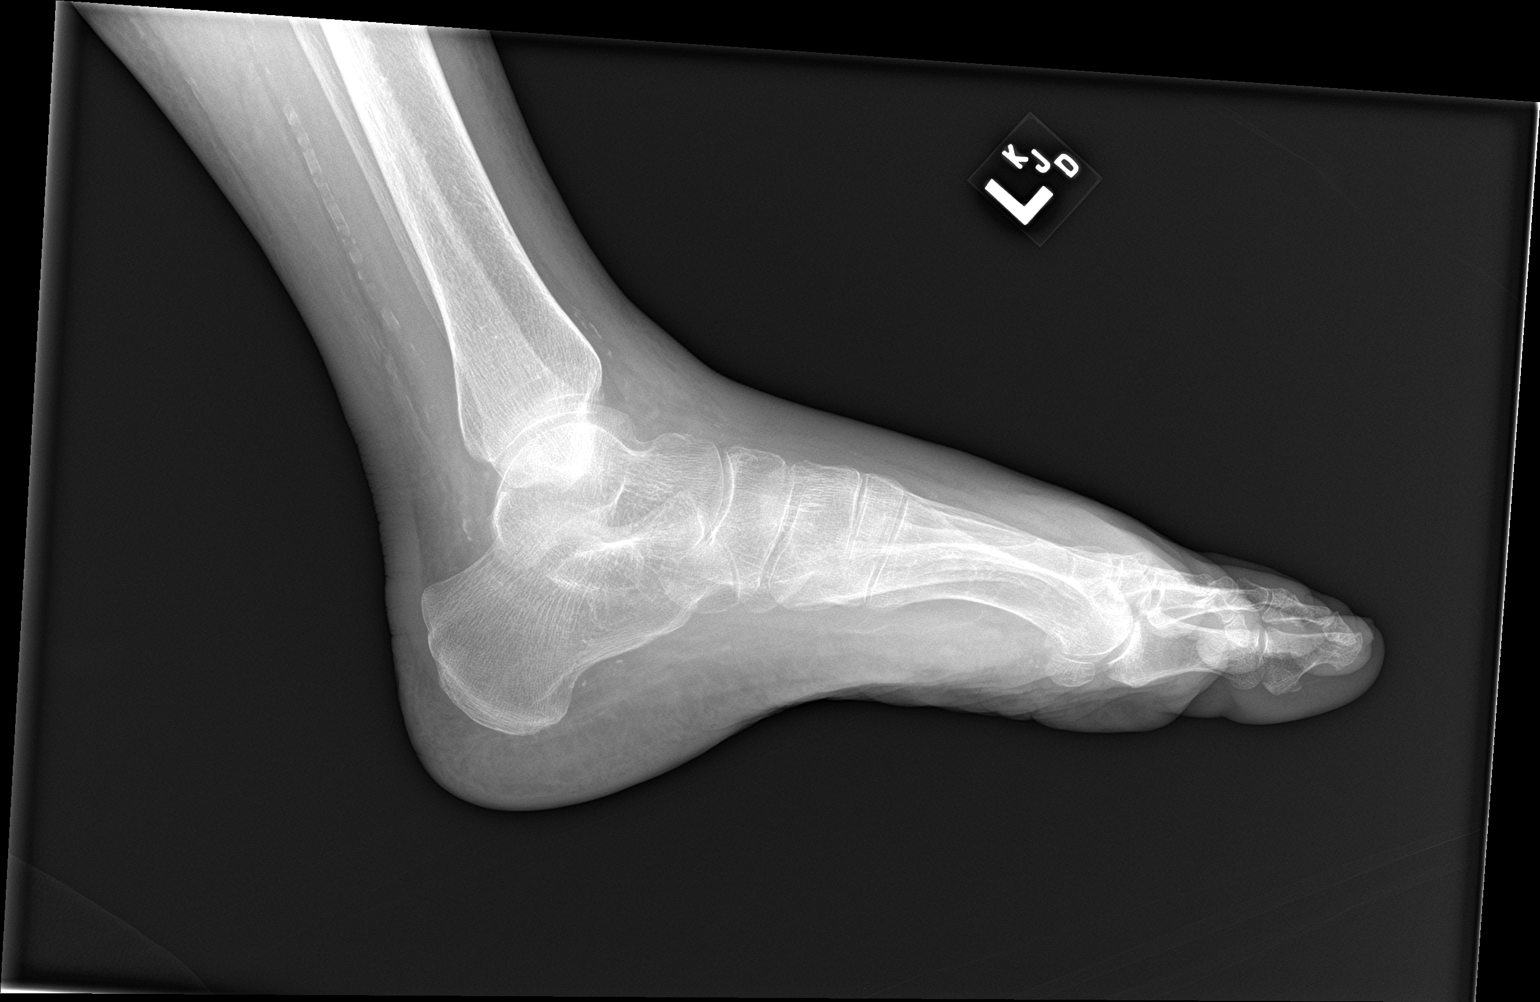

[3 of 3 positions shown; findings below may reference images not displayed]

FINDINGS: No fracture or dislocation is seen.

Mild degenerative changes of the 1st MTP joint.

Mild soft tissue swelling along the dorsal forefoot.
IMPRESSION: Negative.

## 2022-07-08 DIAGNOSIS — D631 Anemia in chronic kidney disease: Secondary | ICD-10-CM | POA: Diagnosis not present

## 2022-07-08 DIAGNOSIS — R197 Diarrhea, unspecified: Secondary | ICD-10-CM | POA: Diagnosis not present

## 2022-07-08 DIAGNOSIS — Z992 Dependence on renal dialysis: Secondary | ICD-10-CM | POA: Diagnosis not present

## 2022-07-08 DIAGNOSIS — N2581 Secondary hyperparathyroidism of renal origin: Secondary | ICD-10-CM | POA: Diagnosis not present

## 2022-07-08 DIAGNOSIS — N186 End stage renal disease: Secondary | ICD-10-CM | POA: Diagnosis not present

## 2022-07-08 DIAGNOSIS — L299 Pruritus, unspecified: Secondary | ICD-10-CM | POA: Diagnosis not present

## 2022-07-08 LAB — CBC WITH DIFFERENTIAL/PLATELET
Absolute Monocytes: 539 cells/uL (ref 200–950)
Basophils Absolute: 39 cells/uL (ref 0–200)
Basophils Relative: 0.8 %
Eosinophils Absolute: 431 cells/uL (ref 15–500)
Eosinophils Relative: 8.8 %
HCT: 33.4 % — ABNORMAL LOW (ref 35.0–45.0)
Hemoglobin: 10.9 g/dL — ABNORMAL LOW (ref 11.7–15.5)
Lymphs Abs: 1279 cells/uL (ref 850–3900)
MCH: 31.7 pg (ref 27.0–33.0)
MCHC: 32.6 g/dL (ref 32.0–36.0)
MCV: 97.1 fL (ref 80.0–100.0)
MPV: 10.8 fL (ref 7.5–12.5)
Monocytes Relative: 11 %
Neutro Abs: 2612 cells/uL (ref 1500–7800)
Neutrophils Relative %: 53.3 %
Platelets: 215 10*3/uL (ref 140–400)
RBC: 3.44 10*6/uL — ABNORMAL LOW (ref 3.80–5.10)
RDW: 16 % — ABNORMAL HIGH (ref 11.0–15.0)
Total Lymphocyte: 26.1 %
WBC: 4.9 10*3/uL (ref 3.8–10.8)

## 2022-07-08 LAB — COMPLETE METABOLIC PANEL WITH GFR
AG Ratio: 1.4 (calc) (ref 1.0–2.5)
ALT: 11 U/L (ref 6–29)
AST: 21 U/L (ref 10–35)
Albumin: 3.8 g/dL (ref 3.6–5.1)
Alkaline phosphatase (APISO): 169 U/L — ABNORMAL HIGH (ref 37–153)
BUN/Creatinine Ratio: 5 (calc) — ABNORMAL LOW (ref 6–22)
BUN: 18 mg/dL (ref 7–25)
CO2: 29 mmol/L (ref 20–32)
Calcium: 8.9 mg/dL (ref 8.6–10.4)
Chloride: 93 mmol/L — ABNORMAL LOW (ref 98–110)
Creat: 3.32 mg/dL — ABNORMAL HIGH (ref 0.60–1.00)
Globulin: 2.8 g/dL (calc) (ref 1.9–3.7)
Glucose, Bld: 148 mg/dL — ABNORMAL HIGH (ref 65–99)
Potassium: 3.7 mmol/L (ref 3.5–5.3)
Sodium: 139 mmol/L (ref 135–146)
Total Bilirubin: 0.8 mg/dL (ref 0.2–1.2)
Total Protein: 6.6 g/dL (ref 6.1–8.1)
eGFR: 14 mL/min/{1.73_m2} — ABNORMAL LOW (ref >=60)

## 2022-07-08 LAB — C-REACTIVE PROTEIN: CRP: 37 mg/L — ABNORMAL HIGH (ref <8.0)

## 2022-07-08 LAB — CULTURE, BLOOD (SINGLE)
MICRO NUMBER:: 13678260
MICRO NUMBER:: 13678259

## 2022-07-08 LAB — SEDIMENTATION RATE: Sed Rate: 22 mm/h (ref 0–30)

## 2022-07-09 ENCOUNTER — Telehealth: Payer: Self-pay

## 2022-07-09 IMAGING — CR DG CHEST 2V
2 series · 2 of 2 positions shown · non-contrast
Comparison: Chest x-ray 11/20/2021.

CLINICAL DATA: Shortness of breath with cough.

EXAM:
CHEST - 2 VIEW

[chest lat]
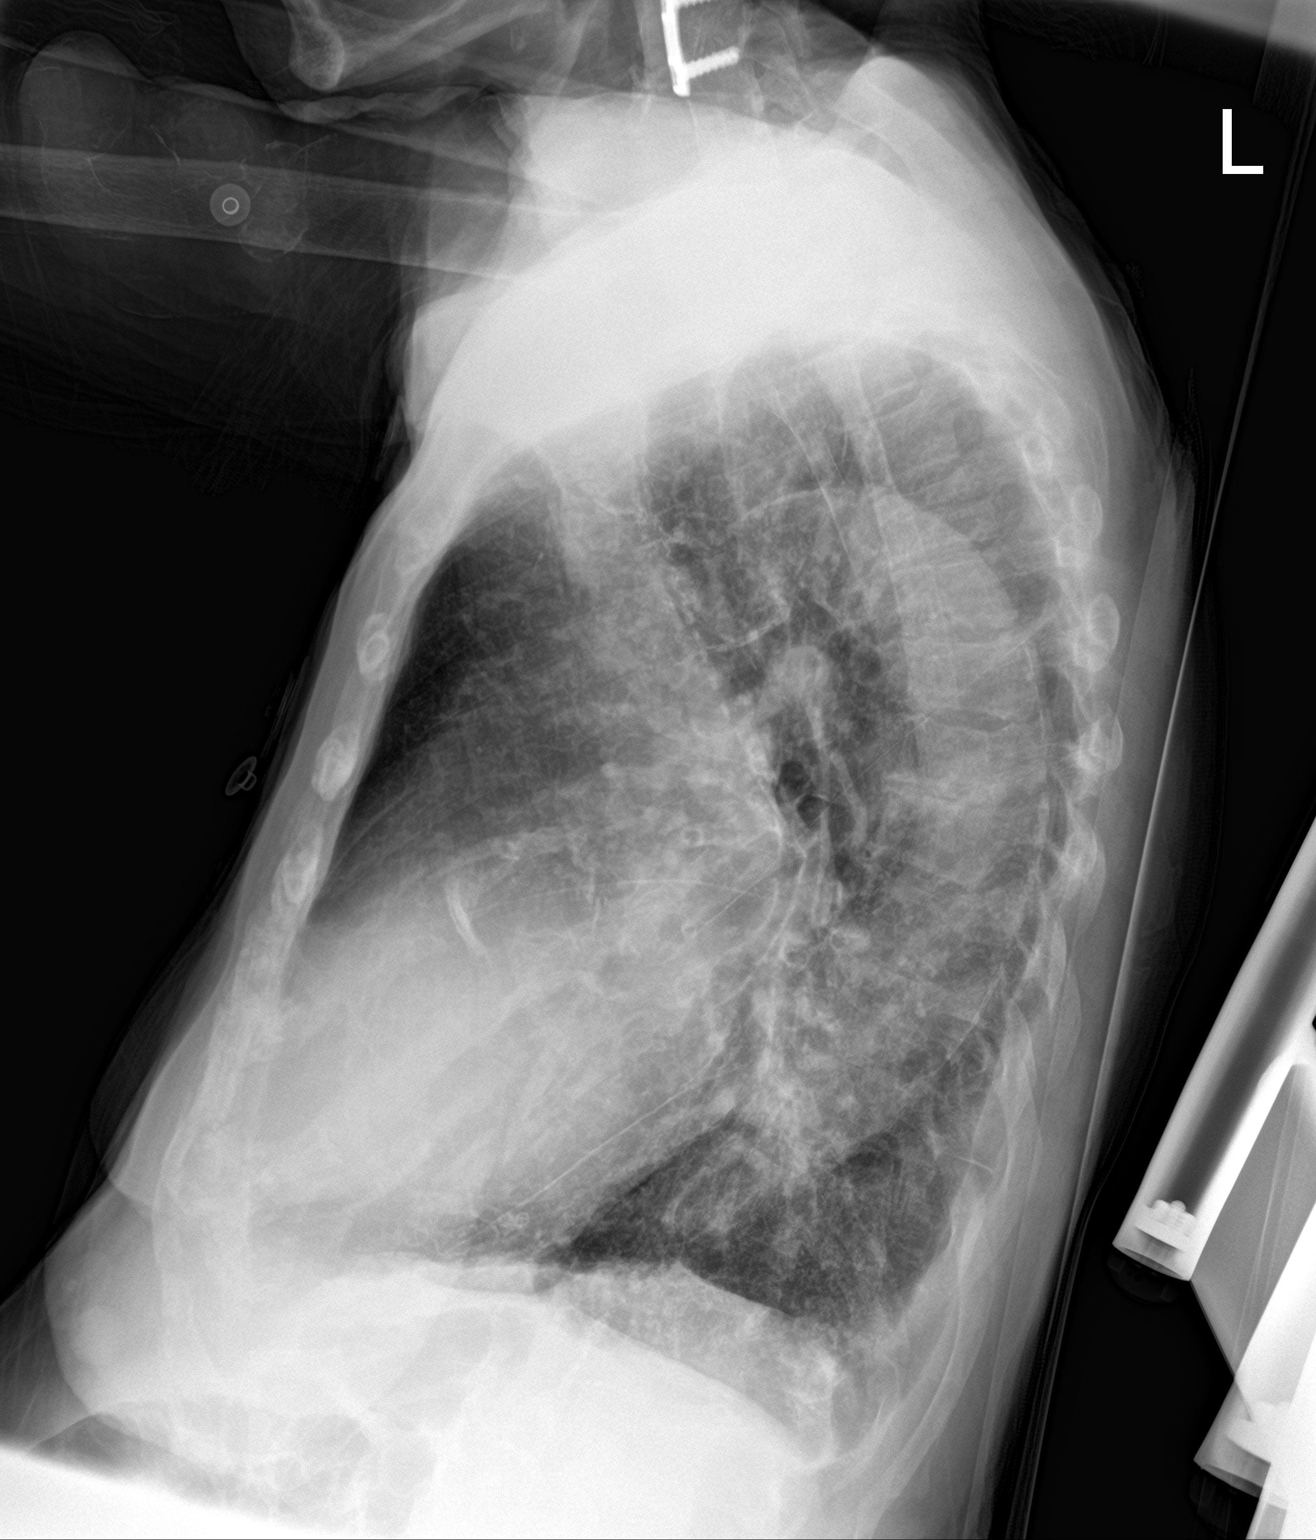

[chest ap]
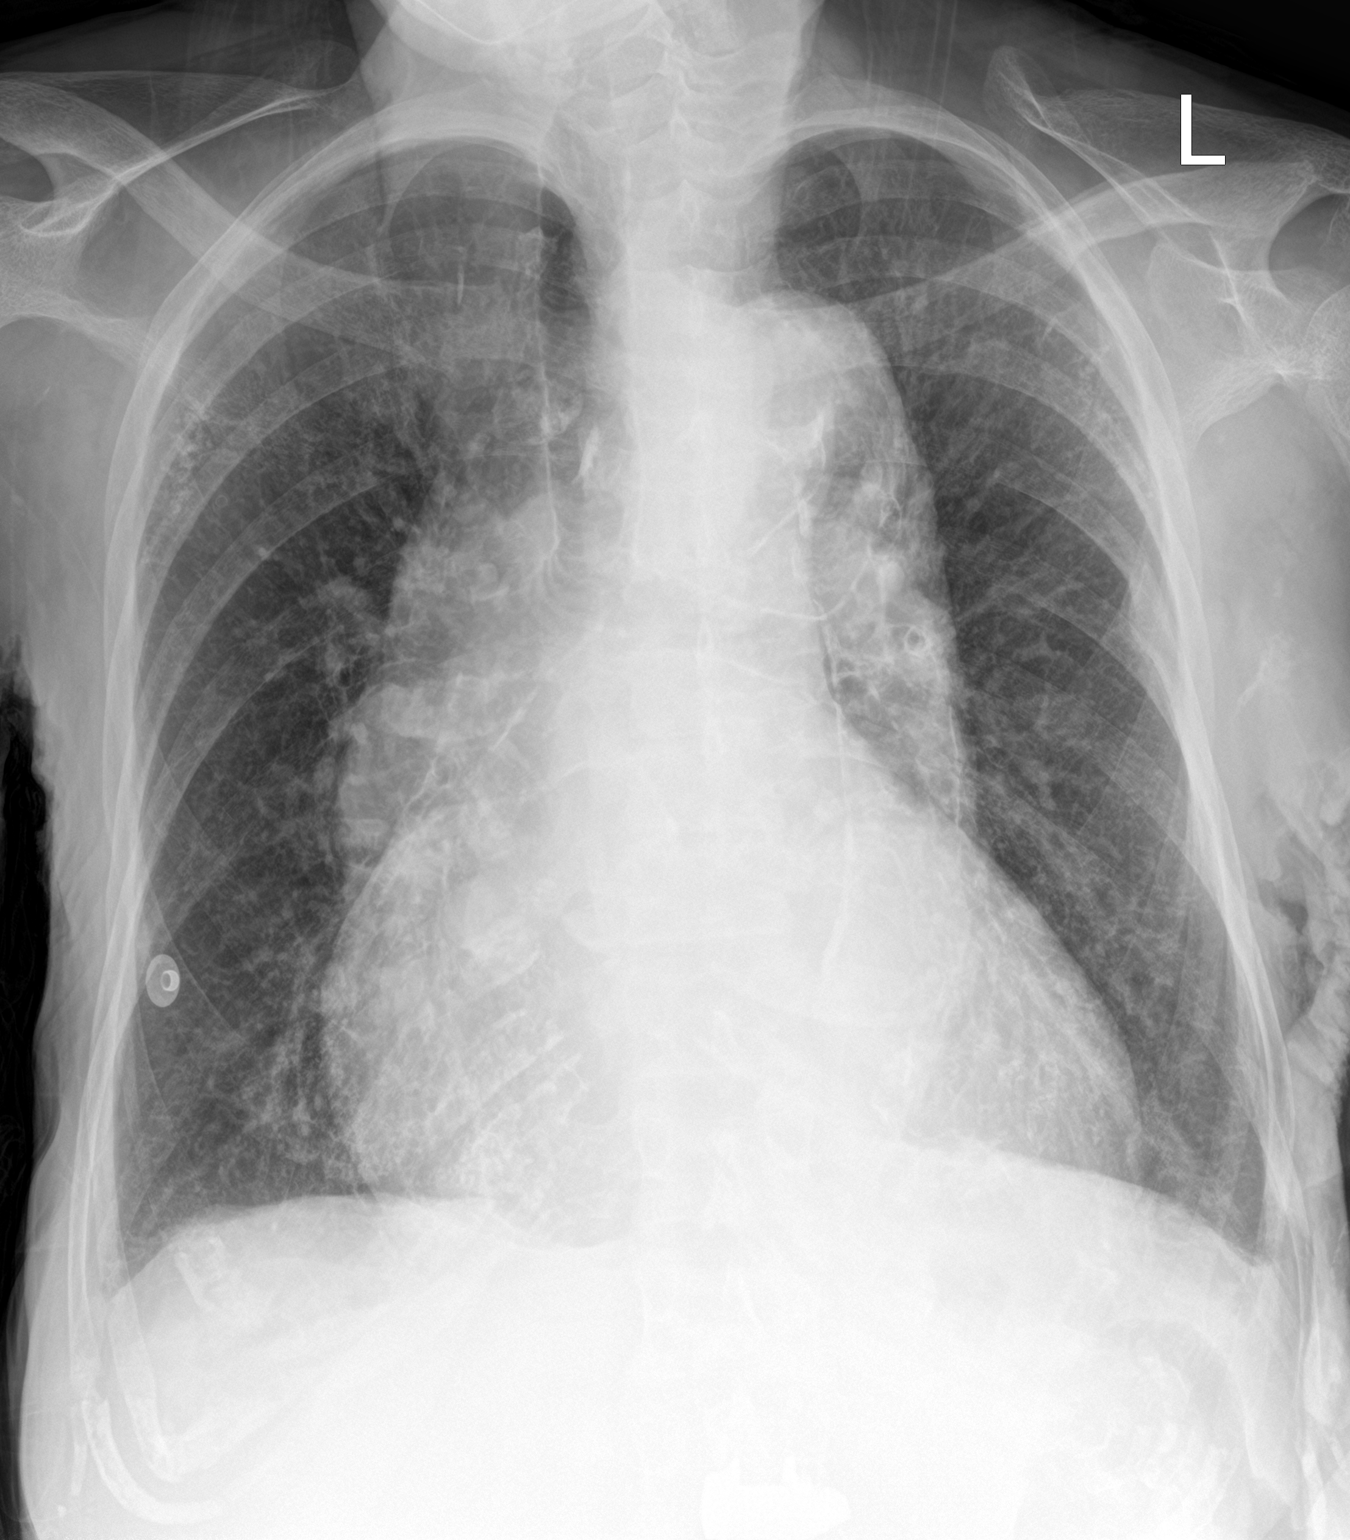

[2 of 2 positions shown; findings below may reference images not displayed]

FINDINGS: The heart is enlarged, unchanged. Aorta is tortuous with
atherosclerotic calcifications, unchanged. There is no lung
consolidation. There is blunting of the left costophrenic angle.
There is some stable scarring/calcifications in the upper lungs.
There is no pneumothorax. No acute fractures are seen.
IMPRESSION: 1. Small left pleural effusion.
2. Stable cardiomegaly.

## 2022-07-09 NOTE — Telephone Encounter (Signed)
-----   Message from Laurice Record, MD sent at 07/09/2022 12:06 PM EDT ----- Stable labs

## 2022-07-09 NOTE — Telephone Encounter (Signed)
Spoke with [name], advised her that her recent lab work was stable. She reports that she's working with PT and that she is struggling with her endurance and would like Dr. Candiss Norse to know.   Asked her to please discuss this with her PCP as well. Patient verbalized understanding and has no further questions.   Beryle Flock, RN

## 2022-07-10 DIAGNOSIS — Z992 Dependence on renal dialysis: Secondary | ICD-10-CM | POA: Diagnosis not present

## 2022-07-10 DIAGNOSIS — N2581 Secondary hyperparathyroidism of renal origin: Secondary | ICD-10-CM | POA: Diagnosis not present

## 2022-07-10 DIAGNOSIS — R197 Diarrhea, unspecified: Secondary | ICD-10-CM | POA: Diagnosis not present

## 2022-07-10 DIAGNOSIS — L299 Pruritus, unspecified: Secondary | ICD-10-CM | POA: Diagnosis not present

## 2022-07-10 DIAGNOSIS — N186 End stage renal disease: Secondary | ICD-10-CM | POA: Diagnosis not present

## 2022-07-10 DIAGNOSIS — D631 Anemia in chronic kidney disease: Secondary | ICD-10-CM | POA: Diagnosis not present

## 2022-07-13 ENCOUNTER — Other Ambulatory Visit: Payer: Self-pay

## 2022-07-13 ENCOUNTER — Emergency Department (HOSPITAL_COMMUNITY)
Admission: EM | Admit: 2022-07-13 | Discharge: 2022-07-13 | Discharge status: Against Medical Advice | Payer: Medicare Other | Attending: Student | Admitting: Student

## 2022-07-13 ENCOUNTER — Emergency Department (HOSPITAL_COMMUNITY): Payer: Medicare Other

## 2022-07-13 ENCOUNTER — Encounter (HOSPITAL_COMMUNITY): Payer: Self-pay

## 2022-07-13 DIAGNOSIS — R0602 Shortness of breath: Secondary | ICD-10-CM | POA: Diagnosis not present

## 2022-07-13 DIAGNOSIS — R197 Diarrhea, unspecified: Secondary | ICD-10-CM | POA: Insufficient documentation

## 2022-07-13 DIAGNOSIS — J984 Other disorders of lung: Secondary | ICD-10-CM | POA: Diagnosis not present

## 2022-07-13 DIAGNOSIS — N2581 Secondary hyperparathyroidism of renal origin: Secondary | ICD-10-CM | POA: Diagnosis not present

## 2022-07-13 DIAGNOSIS — L299 Pruritus, unspecified: Secondary | ICD-10-CM | POA: Diagnosis not present

## 2022-07-13 DIAGNOSIS — N186 End stage renal disease: Secondary | ICD-10-CM | POA: Diagnosis not present

## 2022-07-13 DIAGNOSIS — T8612 Kidney transplant failure: Secondary | ICD-10-CM | POA: Diagnosis not present

## 2022-07-13 DIAGNOSIS — R109 Unspecified abdominal pain: Secondary | ICD-10-CM | POA: Insufficient documentation

## 2022-07-13 DIAGNOSIS — Z5321 Procedure and treatment not carried out due to patient leaving prior to being seen by health care provider: Secondary | ICD-10-CM | POA: Insufficient documentation

## 2022-07-13 DIAGNOSIS — I517 Cardiomegaly: Secondary | ICD-10-CM | POA: Diagnosis not present

## 2022-07-13 DIAGNOSIS — R1084 Generalized abdominal pain: Secondary | ICD-10-CM | POA: Diagnosis not present

## 2022-07-13 DIAGNOSIS — D631 Anemia in chronic kidney disease: Secondary | ICD-10-CM | POA: Diagnosis not present

## 2022-07-13 DIAGNOSIS — I959 Hypotension, unspecified: Secondary | ICD-10-CM | POA: Diagnosis not present

## 2022-07-13 DIAGNOSIS — R0600 Dyspnea, unspecified: Secondary | ICD-10-CM | POA: Diagnosis not present

## 2022-07-13 DIAGNOSIS — Z992 Dependence on renal dialysis: Secondary | ICD-10-CM | POA: Diagnosis not present

## 2022-07-13 DIAGNOSIS — R9431 Abnormal electrocardiogram [ECG] [EKG]: Secondary | ICD-10-CM | POA: Diagnosis not present

## 2022-07-13 LAB — CBC WITH DIFFERENTIAL/PLATELET
Abs Immature Granulocytes: 0.03 10*3/uL (ref 0.00–0.07)
Basophils Absolute: 0 10*3/uL (ref 0.0–0.1)
Basophils Relative: 1 %
Eosinophils Absolute: 0.4 10*3/uL (ref 0.0–0.5)
Eosinophils Relative: 6 %
HCT: 31.2 % — ABNORMAL LOW (ref 36.0–46.0)
Hemoglobin: 10.2 g/dL — ABNORMAL LOW (ref 12.0–15.0)
Immature Granulocytes: 1 %
Lymphocytes Relative: 29 %
Lymphs Abs: 1.8 10*3/uL (ref 0.7–4.0)
MCH: 32 pg (ref 26.0–34.0)
MCHC: 32.7 g/dL (ref 30.0–36.0)
MCV: 97.8 fL (ref 80.0–100.0)
Monocytes Absolute: 0.9 10*3/uL (ref 0.1–1.0)
Monocytes Relative: 14 %
Neutro Abs: 3.1 10*3/uL (ref 1.7–7.7)
Neutrophils Relative %: 49 %
Platelets: 225 10*3/uL (ref 150–400)
RBC: 3.19 MIL/uL — ABNORMAL LOW (ref 3.87–5.11)
RDW: 18.5 % — ABNORMAL HIGH (ref 11.5–15.5)
WBC: 6.2 10*3/uL (ref 4.0–10.5)
nRBC: 0.3 % — ABNORMAL HIGH (ref 0.0–0.2)

## 2022-07-13 LAB — GASTROINTESTINAL PANEL BY PCR, STOOL (REPLACES STOOL CULTURE)

## 2022-07-13 LAB — C DIFFICILE QUICK SCREEN W PCR REFLEX
C Diff antigen: POSITIVE — AB
C Diff toxin: NEGATIVE

## 2022-07-13 LAB — COMPREHENSIVE METABOLIC PANEL
ALT: 10 U/L (ref 0–44)
AST: 23 U/L (ref 15–41)
Albumin: 2.9 g/dL — ABNORMAL LOW (ref 3.5–5.0)
Alkaline Phosphatase: 179 U/L — ABNORMAL HIGH (ref 38–126)
Anion gap: 15 (ref 5–15)
BUN: 47 mg/dL — ABNORMAL HIGH (ref 8–23)
CO2: 27 mmol/L (ref 22–32)
Calcium: 8.7 mg/dL — ABNORMAL LOW (ref 8.9–10.3)
Chloride: 98 mmol/L (ref 98–111)
Creatinine, Ser: 5.77 mg/dL — ABNORMAL HIGH (ref 0.44–1.00)
GFR, Estimated: 7 mL/min — ABNORMAL LOW (ref >60)
Glucose, Bld: 76 mg/dL (ref 70–99)
Potassium: 4.5 mmol/L (ref 3.5–5.1)
Sodium: 140 mmol/L (ref 135–145)
Total Bilirubin: 0.8 mg/dL (ref 0.3–1.2)
Total Protein: 6.4 g/dL — ABNORMAL LOW (ref 6.5–8.1)

## 2022-07-13 LAB — LIPASE, BLOOD: Lipase: 45 U/L (ref 11–51)

## 2022-07-13 LAB — CLOSTRIDIUM DIFFICILE BY PCR, REFLEXED: Toxigenic C. Difficile by PCR: NEGATIVE

## 2022-07-13 NOTE — ED Triage Notes (Addendum)
Pt BIB [facility] from home c/o abdominal pain and SHOB. Per EMS after giving her complaints the only thing she could talk about is how she was coming her to help get herself changed and cleaned up. Pt is a dialysis pt and is scheduled to go at 11 am this morning. Pt states she is here because she is having issues with her breathing and it started today.

## 2022-07-13 NOTE — ED Provider Triage Note (Signed)
Emergency Medicine Provider Triage Evaluation Note  [name] , a 71 y.o. female  was evaluated in triage.  Pt complains of abdominal discomfort & diarrhea for months. Reports multiple episodes of  diarrhea per day, needs help cleaning herself, having associated intermittent abdominal discomfort- no acute change in pain today. Also notes some dyspnea this AM. Denies fever, chills, emesis, chest pain, or melena. Last dialysis Friday, due for dialysis this AM. Has had abx recently per her report.    Review of Systems  Per above  Physical Exam  BP (!) 151/82 (BP Location: Right Leg)   Pulse 84   Temp 98.1 F (36.7 C) (Oral)   Resp 18   SpO2 96%  Gen:   Awake, no distress   Resp:  Normal effort  MSK:   Moves extremities without difficulty  Other:  Mild abdominal distension, no focal tenderness or peritoneal signs.   Medical Decision Making  Medically screening exam initiated at 5:06 AM.  Appropriate orders placed.  ONNIE ALATORRE was informed that the remainder of the evaluation will be completed by another provider, this initial triage assessment does not replace that evaluation, and the importance of remaining in the ED until their evaluation is complete.  Diarrhea Dyspnea.    Amaryllis Dyke, [place] 07/13/22 [phone]

## 2022-07-13 NOTE — ED Notes (Signed)
Pt states she called her son to pick her up and as soon as he gets here I can take her name out the system

## 2022-07-14 DIAGNOSIS — F322 Major depressive disorder, single episode, severe without psychotic features: Secondary | ICD-10-CM | POA: Diagnosis not present

## 2022-07-14 DIAGNOSIS — R197 Diarrhea, unspecified: Secondary | ICD-10-CM | POA: Diagnosis not present

## 2022-07-14 DIAGNOSIS — Z66 Do not resuscitate: Secondary | ICD-10-CM | POA: Diagnosis not present

## 2022-07-14 DIAGNOSIS — Z8719 Personal history of other diseases of the digestive system: Secondary | ICD-10-CM | POA: Diagnosis not present

## 2022-07-15 DIAGNOSIS — N2581 Secondary hyperparathyroidism of renal origin: Secondary | ICD-10-CM | POA: Diagnosis not present

## 2022-07-15 DIAGNOSIS — R52 Pain, unspecified: Secondary | ICD-10-CM | POA: Diagnosis not present

## 2022-07-15 DIAGNOSIS — D631 Anemia in chronic kidney disease: Secondary | ICD-10-CM | POA: Diagnosis not present

## 2022-07-15 DIAGNOSIS — L299 Pruritus, unspecified: Secondary | ICD-10-CM | POA: Diagnosis not present

## 2022-07-15 DIAGNOSIS — N186 End stage renal disease: Secondary | ICD-10-CM | POA: Diagnosis not present

## 2022-07-15 DIAGNOSIS — R197 Diarrhea, unspecified: Secondary | ICD-10-CM | POA: Diagnosis not present

## 2022-07-15 DIAGNOSIS — Z992 Dependence on renal dialysis: Secondary | ICD-10-CM | POA: Diagnosis not present

## 2022-07-17 DIAGNOSIS — N186 End stage renal disease: Secondary | ICD-10-CM | POA: Diagnosis not present

## 2022-07-17 DIAGNOSIS — D631 Anemia in chronic kidney disease: Secondary | ICD-10-CM | POA: Diagnosis not present

## 2022-07-17 DIAGNOSIS — Z992 Dependence on renal dialysis: Secondary | ICD-10-CM | POA: Diagnosis not present

## 2022-07-17 DIAGNOSIS — R197 Diarrhea, unspecified: Secondary | ICD-10-CM | POA: Diagnosis not present

## 2022-07-17 DIAGNOSIS — L299 Pruritus, unspecified: Secondary | ICD-10-CM | POA: Diagnosis not present

## 2022-07-17 DIAGNOSIS — R52 Pain, unspecified: Secondary | ICD-10-CM | POA: Diagnosis not present

## 2022-07-17 DIAGNOSIS — N2581 Secondary hyperparathyroidism of renal origin: Secondary | ICD-10-CM | POA: Diagnosis not present

## 2022-07-20 DIAGNOSIS — R197 Diarrhea, unspecified: Secondary | ICD-10-CM | POA: Diagnosis not present

## 2022-07-20 DIAGNOSIS — N186 End stage renal disease: Secondary | ICD-10-CM | POA: Diagnosis not present

## 2022-07-20 DIAGNOSIS — D631 Anemia in chronic kidney disease: Secondary | ICD-10-CM | POA: Diagnosis not present

## 2022-07-20 DIAGNOSIS — R52 Pain, unspecified: Secondary | ICD-10-CM | POA: Diagnosis not present

## 2022-07-20 DIAGNOSIS — L299 Pruritus, unspecified: Secondary | ICD-10-CM | POA: Diagnosis not present

## 2022-07-20 DIAGNOSIS — Z992 Dependence on renal dialysis: Secondary | ICD-10-CM | POA: Diagnosis not present

## 2022-07-20 DIAGNOSIS — N2581 Secondary hyperparathyroidism of renal origin: Secondary | ICD-10-CM | POA: Diagnosis not present

## 2022-07-22 DIAGNOSIS — D631 Anemia in chronic kidney disease: Secondary | ICD-10-CM | POA: Diagnosis not present

## 2022-07-22 DIAGNOSIS — N2581 Secondary hyperparathyroidism of renal origin: Secondary | ICD-10-CM | POA: Diagnosis not present

## 2022-07-22 DIAGNOSIS — L299 Pruritus, unspecified: Secondary | ICD-10-CM | POA: Diagnosis not present

## 2022-07-22 DIAGNOSIS — Z992 Dependence on renal dialysis: Secondary | ICD-10-CM | POA: Diagnosis not present

## 2022-07-22 DIAGNOSIS — R52 Pain, unspecified: Secondary | ICD-10-CM | POA: Diagnosis not present

## 2022-07-22 DIAGNOSIS — N186 End stage renal disease: Secondary | ICD-10-CM | POA: Diagnosis not present

## 2022-07-22 DIAGNOSIS — R197 Diarrhea, unspecified: Secondary | ICD-10-CM | POA: Diagnosis not present

## 2022-07-23 ENCOUNTER — Ambulatory Visit: Payer: Medicare Other | Admitting: Physician Assistant

## 2022-07-23 ENCOUNTER — Ambulatory Visit (HOSPITAL_COMMUNITY)
Admission: RE | Admit: 2022-07-23 | Discharge: 2022-07-23 | Disposition: A | Discharge status: Home / Self Care | Payer: Medicare Other | Source: Ambulatory Visit | Attending: Vascular Surgery | Admitting: Vascular Surgery

## 2022-07-23 ENCOUNTER — Ambulatory Visit (INDEPENDENT_AMBULATORY_CARE_PROVIDER_SITE_OTHER)
Admission: RE | Admit: 2022-07-23 | Discharge: 2022-07-23 | Disposition: A | Discharge status: Home / Self Care | Payer: Medicare Other | Source: Ambulatory Visit | Attending: Vascular Surgery | Admitting: Vascular Surgery

## 2022-07-23 VITALS — BP 119/60 | HR 88 | Temp 98.0°F | Resp 20 | Ht 62.5 in

## 2022-07-23 DIAGNOSIS — I132 Hypertensive heart and chronic kidney disease with heart failure and with stage 5 chronic kidney disease, or end stage renal disease: Secondary | ICD-10-CM | POA: Diagnosis not present

## 2022-07-23 DIAGNOSIS — I252 Old myocardial infarction: Secondary | ICD-10-CM | POA: Diagnosis not present

## 2022-07-23 DIAGNOSIS — I70245 Atherosclerosis of native arteries of left leg with ulceration of other part of foot: Secondary | ICD-10-CM

## 2022-07-23 DIAGNOSIS — Z8673 Personal history of transient ischemic attack (TIA), and cerebral infarction without residual deficits: Secondary | ICD-10-CM | POA: Diagnosis not present

## 2022-07-23 DIAGNOSIS — I739 Peripheral vascular disease, unspecified: Secondary | ICD-10-CM

## 2022-07-23 DIAGNOSIS — I509 Heart failure, unspecified: Secondary | ICD-10-CM | POA: Diagnosis not present

## 2022-07-23 DIAGNOSIS — F32A Depression, unspecified: Secondary | ICD-10-CM | POA: Diagnosis not present

## 2022-07-23 DIAGNOSIS — N186 End stage renal disease: Secondary | ICD-10-CM | POA: Diagnosis not present

## 2022-07-23 DIAGNOSIS — I2510 Atherosclerotic heart disease of native coronary artery without angina pectoris: Secondary | ICD-10-CM | POA: Diagnosis not present

## 2022-07-23 DIAGNOSIS — Z992 Dependence on renal dialysis: Secondary | ICD-10-CM | POA: Diagnosis not present

## 2022-07-23 DIAGNOSIS — Z9181 History of falling: Secondary | ICD-10-CM | POA: Diagnosis not present

## 2022-07-23 DIAGNOSIS — Z7902 Long term (current) use of antithrombotics/antiplatelets: Secondary | ICD-10-CM | POA: Diagnosis not present

## 2022-07-23 NOTE — Progress Notes (Signed)
HISTORY AND PHYSICAL     CC:  follow up. Requesting Provider:  [name], MD  HPI: This is a 71 y.o. female who is here today for follow up for PAD.  Pt has hx of Angiogram on 03/26/22 by Dr. Carlis Abbott. This was for CLI with non healing wounds. She had Left SFA/ above knee popliteal artery angioplasty and stent placement as well as left peroneal angioplasty. She was noted to only have single vessel peroneal runoff.   She also has ESRD and recently underwent exploration of left arm graft and evacuation of hematoma By Dr. Scot Dock on 05/15/22. Prior to this she also had to have a excision of segment of right arm AV graft with revision (interposition 6 mm PTFE graft) on 04/27/22 by Dr. Scot Dock. This was due to wound present overlying graft with concerns of infection.   She dialyzes M/W/F at the [facility] location  Pt was last seen 06/11/2022 and at that time, her wounds had healed and she was not having any steal symptoms.  She had stated her left leg was doing well without new pain or wounds.  The pt returns today for follow up.  She tells me she that she has some pain in the right foot for about a week.  She did not realize she had a sore on the plantar surface of the right great toe.  She denies any claudication or pain in her feet at night.  She tells me the left foot feels better and the wound is improving.  She states her dialysis access is working well.   The pt is on a statin for cholesterol management.    The pt is not on an aspirin.    Other AC:  Plavix The pt is not on medication for hypertension.  The pt does not have diabetes. Tobacco hx:  former    Past Medical History:  Diagnosis Date   Acute ischemic stroke ([place]) 02/23/2017   Acute on chronic diastolic CHF (congestive heart failure) ([place])    Adenomatous polyp of colon 10/2010, 2006, 2015   Anemia in CKD (chronic kidney disease) 11/07/2012   s/p blood transfusion.    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  Had some early rejection then stable function for years, then had slow decline of function and went back on hemodialysis in 2012.  Gets HD TTS schedule at [facility] on [facility] still using L forearm AVF.      GERD (gastroesophageal reflux disease)    GI bleed 2017   felt to be ischemic colitis, last colo 2015   Hyperlipidemia    Hypertension    Neurologic gait dysfunction    Neuromuscular disorder (HCC)    neuropathy hand and legs   NSTEMI (non-ST elevated myocardial infarction) ([place]) 05/14/2020   Osteoporosis    Pneumonia    Pseudoaneurysm of surgical AV fistula (HCC)    left upper arm    Past Surgical History:  Procedure Laterality Date   ABDOMINAL AORTOGRAM W/LOWER EXTREMITY N/A 03/26/2022   Procedure: ABDOMINAL AORTOGRAM W/LOWER EXTREMITY;  Surgeon: Marty Heck, MD;  Location: [place] [facility];  Service: Cardiovascular;  Laterality: N/A;  Left   AV FISTULA PLACEMENT     for dialysis   AV FISTULA PLACEMENT Left 11/22/2015   Procedure: ARTERIOVENOUS (AV) FISTULA CREATION-LEFT BRACHIOCEPHALIC;  Surgeon: Serafina Mitchell, MD;  Location: [place];  Service: Vascular;  Laterality: Left;   AV FISTULA PLACEMENT Right 03/15/2020   Procedure: INSERTION OF ARTERIOVENOUS (AV) GORE-TEX GRAFT ARM ( BRACHIAL AXILLARY );  Surgeon: Katha Cabal, MD;  Location: [facility];  Service: Vascular;  Laterality: Right;   AV FISTULA PLACEMENT Right 05/15/2022   Procedure: EXPLORATION  OF  RIGHT ARM ARTERIOVENOUS GORE-TEX GRAFT;  Surgeon: Angelia Mould, MD;  Location: [place];  Service: Vascular;  Laterality: Right;   [place] REMOVAL Right 04/27/2022   Procedure: REVISION OF  RIGHT ARTERIOVENOUS GRAFT AND EXCISION OF RIGHT DEGENERATED GRAFT (AVG);  Surgeon: Angelia Mould, MD;  Location: [place];  Service: Vascular;  Laterality: Right;   BACK SURGERY     CERVICAL FUSION     CHOLECYSTECTOMY  12/02/2012   Procedure: LAPAROSCOPIC CHOLECYSTECTOMY WITH INTRAOPERATIVE CHOLANGIOGRAM;  Surgeon: Edward Jolly, MD;  Location: [place];  Service: General;  Laterality: N/A;   EYE SURGERY Bilateral    cataract surgery   EYE SURGERY Left 2019   laser   HEMATOMA EVACUATION Left 12/24/2016   Procedure: EVACUATION HEMATOMA LEFT UPPER ARM;  Surgeon: Waynetta Sandy, MD;  Location: [place];  Service: Vascular;  Laterality: Left;   I & D EXTREMITY Left 12/31/2016   Procedure: IRRIGATION AND DEBRIDEMENT EXTREMITY;  Surgeon: Angelia Mould, MD;  Location: [place];  Service: Vascular;  Laterality: Left;   INSERTION OF DIALYSIS CATHETER Right 12/24/2016   Procedure: INSERTION OF DIALYSIS CATHETER;  Surgeon: Waynetta Sandy, MD;  Location: [place];  Service: Vascular;  Laterality: Right;   INSERTION OF DIALYSIS CATHETER Right 02/04/2017   Procedure: INSERTION OF DIALYSIS CATHETER;  Surgeon: Waynetta Sandy, MD;  Location: [place];  Service: Vascular;  Laterality: Right;   [place] Left 10/23/2016   Procedure: Fistulagram;  Surgeon: Elam Dutch, MD;  Location: [place] [facility];  Service: Cardiovascular;  Laterality: Left;   PERIPHERAL VASCULAR INTERVENTION  03/26/2022   Procedure: PERIPHERAL VASCULAR INTERVENTION;  Surgeon: Marty Heck, MD;  Location: [place] [facility];  Service: Cardiovascular;;  left sfa   PERIPHERAL VASCULAR THROMBECTOMY Right 04/16/2020   Procedure: PERIPHERAL VASCULAR THROMBECTOMY;  Surgeon: Katha Cabal, MD;  Location: [place] [facility];  Service: Cardiovascular;  Laterality: Right;   RESECTION OF ARTERIOVENOUS FISTULA ANEURYSM Left 11/22/2015   Procedure:  RESECTION OF LEFT RADIOCEPHALIC FISTULA ANEURYSM ;  Surgeon: Serafina Mitchell, MD;  Location: [place] [facility];  Service: Vascular;  Laterality: Left;   REVISON OF ARTERIOVENOUS FISTULA Left 12/22/2016   Procedure: REVISON OF LEFT ARTERIOVENOUS FISTULA;  Surgeon: Waynetta Sandy, MD;  Location: [place];  Service: Vascular;  Laterality: Left;   REVISON OF ARTERIOVENOUS FISTULA Left 02/04/2017   Procedure: REVISON OF LEFT UPPER ARM ARTERIOVENOUS FISTULA;  Surgeon: Waynetta Sandy, MD;  Location: [place];  Service: Vascular;  Laterality: Left;   UPPER EXTREMITY ANGIOGRAPHY Bilateral 09/19/2019   Procedure: UPPER EXTREMITY ANGIOGRAPHY;  Surgeon: Katha Cabal, MD;  Location: [place] [facility];  Service: Cardiovascular;  Laterality: Bilateral;    Allergies  Allergen Reactions   Sulfa Antibiotics Other (See Comments)    Per patient, both parents allergic-so will not take   Adhesive [Tape] Itching  Current Outpatient Medications  Medication Sig Dispense Refill   acetaminophen (TYLENOL) 500 MG tablet Take 1 tablet (500 mg total) by mouth every 6 (six) hours as needed. (Patient taking differently: Take 500 mg by mouth every 6 (six) hours as needed (pain).) 30 tablet 0   albuterol (PROVENTIL) (2.5 MG/3ML) 0.083% nebulizer solution Take 3 mLs (2.5 mg total) by nebulization every 4 (four) hours as needed for wheezing. (Patient taking differently: Take 2.5 mg by nebulization every 6 (six) hours as needed for wheezing.) 75 mL 12   albuterol (VENTOLIN HFA) 108 (90 Base) MCG/ACT inhaler Inhale 1-2 puffs into the lungs every 6 (six) hours as needed for wheezing or shortness of breath. (Patient taking differently: Inhale 2 puffs into the lungs every 6 (six) hours as needed for shortness of breath.) 1 each 0   atorvastatin (LIPITOR) 80 MG tablet Take 80 mg by mouth at bedtime.     B Complex-C-Folic Acid (DIALYVITE 096) 0.8 MG WAFR Take 1 tablet by mouth every morning.     clopidogrel (PLAVIX) 75 MG  tablet Take 1 tablet (75 mg total) by mouth daily. (Patient taking differently: Take 75 mg by mouth at bedtime.) 30 tablet 11   doxycycline (VIBRA-TABS) 100 MG tablet Take 1 tablet (100 mg total) by mouth 2 (two) times daily. 60 tablet 5   escitalopram (LEXAPRO) 10 MG tablet Take 10 mg by mouth every morning.     hydrOXYzine (ATARAX) 25 MG tablet Take 1 tablet (25 mg total) by mouth every 8 (eight) hours as needed for itching. 30 tablet 0   midodrine (PROAMATINE) 10 MG tablet Take 1 tablet (10 mg total) by mouth 3 (three) times daily with meals.     Nutritional Supplements (NEPRO) LIQD Take 237 mLs by mouth 2 (two) times daily between meals.     ondansetron (ZOFRAN) 8 MG tablet Take 8 mg by mouth every 12 (twelve) hours as needed for nausea or vomiting.  0   OVER THE COUNTER MEDICATION Take 1 tablet by mouth every morning. Folic acid - vitamin B complex - vitamin C - selenium - zinc 3 mg     pantoprazole (PROTONIX) 40 MG tablet Take 1 tablet (40 mg total) by mouth daily. 60 tablet    sevelamer (RENAGEL) 800 MG tablet Take 1,600 mg by mouth 3 (three) times daily with meals.     zolpidem (AMBIEN) 5 MG tablet Take 1 tablet (5 mg total) by mouth at bedtime as needed for up to 10 days for sleep (Insomnia). 10 tablet 0   No current facility-administered medications for this visit.    Family History  Problem Relation Age of Onset   Colon cancer Brother    Cancer Brother    Coronary artery disease Mother 64   Hyperlipidemia Mother    Hypertension Mother    Stroke Maternal Aunt    Esophageal cancer Neg Hx    Stomach cancer Neg Hx    Rectal cancer Neg Hx     Social History   Socioeconomic History   Marital status: Widowed    Spouse name: Not on file   Number of children: 2   Years of education: Not on file   Highest education level: Not on file  Occupational History   Not on file  Tobacco Use   Smoking status: Former    Types: Cigarettes    Quit date: 12/31/1991    Years since  quitting: 30.5   Smokeless tobacco: Never  Vaping Use   Vaping  Use: Never used  Substance and Sexual Activity   Alcohol use: No    Alcohol/week: 0.0 standard drinks of alcohol   Drug use: No   Sexual activity: Not Currently  Other Topics Concern   Not on file  Social History Narrative   Living with her mother    Right handed   Caffeine: only decaf clears ("no brown sodas" or coffee d/t kidney disease)   Low potassium foods d/t kidney disease   Social Determinants of Health   Financial Resource Strain: Not on file  Food Insecurity: Not on file  Transportation Needs: Not on file  Physical Activity: Not on file  Stress: Not on file  Social Connections: Not on file  Intimate Partner Violence: Not on file     REVIEW OF SYSTEMS:   '[X]'$  denotes positive finding, '[ ]'$  denotes negative finding Cardiac  Comments:  Chest pain or chest pressure:    Shortness of breath upon exertion:    Short of breath when lying flat:    Irregular heart rhythm:        Vascular    Pain in calf, thigh, or hip brought on by ambulation:    Pain in feet at night that wakes you up from your sleep:     Blood clot in your veins:    Leg swelling:     Pain in right foot x   Pulmonary    Oxygen at home:    Productive cough:     Wheezing:         Neurologic    Sudden weakness in arms or legs:     Sudden numbness in arms or legs:     Sudden onset of difficulty speaking or slurred speech:    Temporary loss of vision in one eye:     Problems with dizziness:         Gastrointestinal    Blood in stool:     Vomited blood:         Genitourinary    Burning when urinating:     Blood in urine:        Psychiatric    Major depression:         Hematologic    Bleeding problems:    Problems with blood clotting too easily:        Skin    Rashes or ulcers:        Constitutional    Fever or chills:      PHYSICAL EXAMINATION:  Today's Vitals   07/23/22 1359  BP: 119/60  Pulse: 88  Resp: 20   Temp: 98 F (36.7 C)  TempSrc: Temporal  SpO2: 99%  Height: 5' 2.5" (1.588 m)  PainSc: 3   PainLoc: Foot   Body mass index is 18 kg/m.   General:  WDWN in NAD; vital signs documented above Gait: Not observed HENT: WNL, normocephalic Pulmonary: normal non-labored breathing , without wheezing Cardiac: regular HR, without carotid bruits Abdomen: soft, NT, no masses; aortic pulse is not palpable Skin: without rashes Vascular Exam/Pulses:  Right Left  Radial 2+ (normal) 2+ (normal)  Femoral 2+ (normal) 2+ (normal)  Popliteal 2+ (normal) 2+ (normal)  DP Monophasic doppler Monophasic doppler  PT Monophasic doppler Monophasic doppler  Peroneal Monophasic doppler Monophasic doppler   Extremities:  Excellent thrill in RUA dialysis access; 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  Right foot   Left foot   Musculoskeletal: no muscle wasting or atrophy  Neurologic: A&O X 3 Psychiatric:  The pt has Normal affect.   Non-Invasive Vascular Imaging:   ABI's/TBI's on 07/23/2022: Right:  unable to obtain - Great toe pressure: 57 Left:  unable to obtain - Great toe pressure: 48  LLE Arterial duplex on 07/23/2022: Patent stent without stenosis  Previous ABI's/TBI's on 03/17/2022: Right:  [place] - Great toe pressure: 44 Left:  0.76/0.35 - Great toe pressure:  96    ASSESSMENT/PLAN:: 71 y.o. female here for follow up for PAD with hx of Angiogram with Left SFA/ above knee popliteal artery angioplasty and stent placement as well as left peroneal angioplasty on 03/26/22 by Dr. Carlis Abbott. This was for CLI with non healing wounds.  She was noted to only have single vessel peroneal runoff.    -pt toe pressure on the left leg, which is the leg that was intervened on has dropped to 48 from 96.  She still has wound on the dorsum of the left foot but she tells me this is getting better.  She did have decreased velocities around the stent on the left.  She did have brisk monophasic doppler flow in  the left foot.    She now has a wound on the plantar aspect of the right great toe.  She did not know this was present but has been having pain there for ~ one week.  I showed the picture to the pt.  She will use a mirror to monitor this area.  She will call us if it is getting worse and not better.  Also instructed her to protect her feet and not walk barefooted.    I discussed pt with Dr. Scot Dock.  Given new wound on the right foot, will have pt return in 3 weeks and see Dr. Carlis Abbott as she most likely is going to require angiogram if right foot wound is not improved.  Her toe pressure on the right is improved from last visit in April.  Dr. Carlis Abbott can also review her duplex from LLE from today as well.  Her stent was patent.  She knows to call sooner if this worsens before then.    Continue plavix/statin   Leontine Locket, [facility] [phone]  Clinic MD:   Scot Dock

## 2022-07-24 DIAGNOSIS — N2581 Secondary hyperparathyroidism of renal origin: Secondary | ICD-10-CM | POA: Diagnosis not present

## 2022-07-24 DIAGNOSIS — L299 Pruritus, unspecified: Secondary | ICD-10-CM | POA: Diagnosis not present

## 2022-07-24 DIAGNOSIS — R52 Pain, unspecified: Secondary | ICD-10-CM | POA: Diagnosis not present

## 2022-07-24 DIAGNOSIS — D631 Anemia in chronic kidney disease: Secondary | ICD-10-CM | POA: Diagnosis not present

## 2022-07-24 DIAGNOSIS — Z992 Dependence on renal dialysis: Secondary | ICD-10-CM | POA: Diagnosis not present

## 2022-07-24 DIAGNOSIS — N186 End stage renal disease: Secondary | ICD-10-CM | POA: Diagnosis not present

## 2022-07-24 DIAGNOSIS — R197 Diarrhea, unspecified: Secondary | ICD-10-CM | POA: Diagnosis not present

## 2022-07-24 IMAGING — CR DG CHEST 2V
2 series · 2 of 2 positions shown · non-contrast
Comparison: November 30, 2021

CLINICAL DATA: Shortness of breath

EXAM:
CHEST - 2 VIEW

[chest lat]
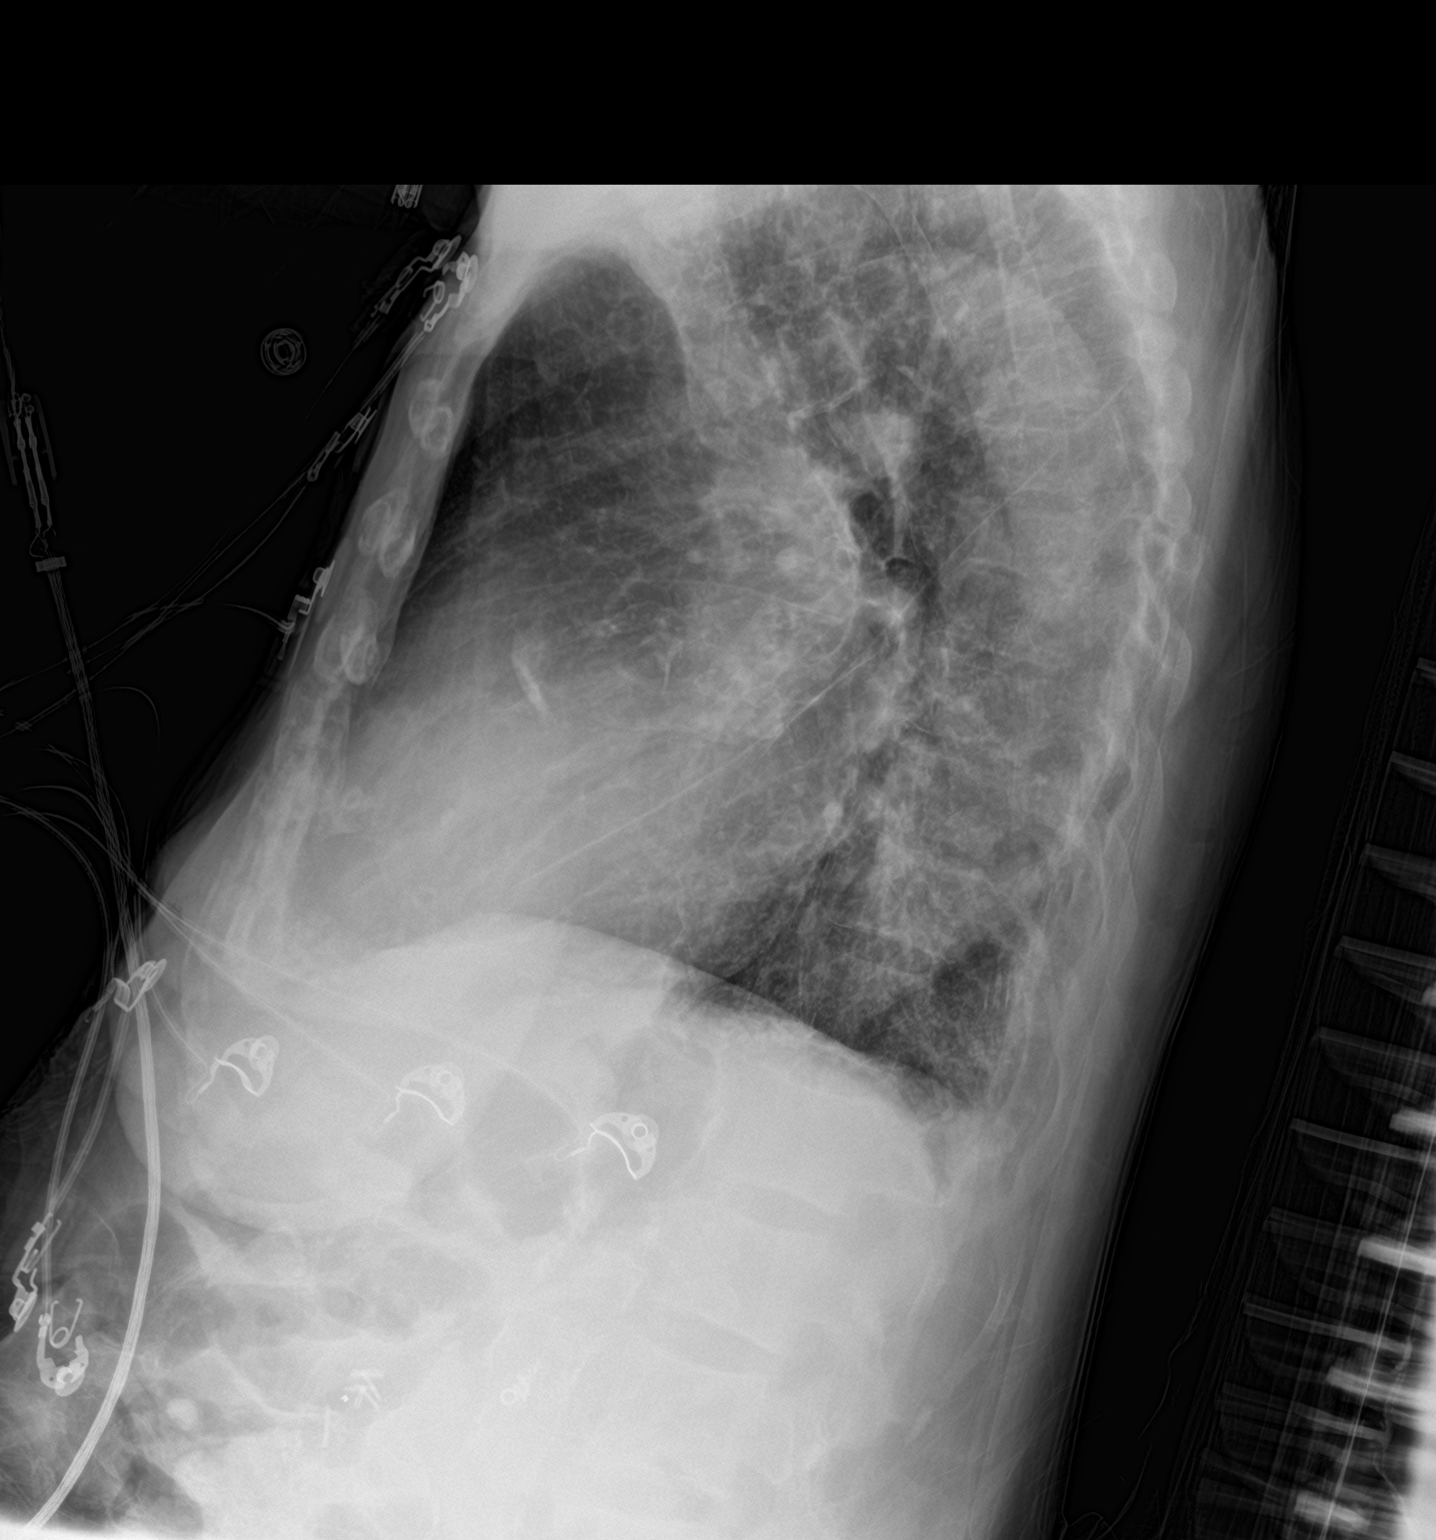

[chest ap]
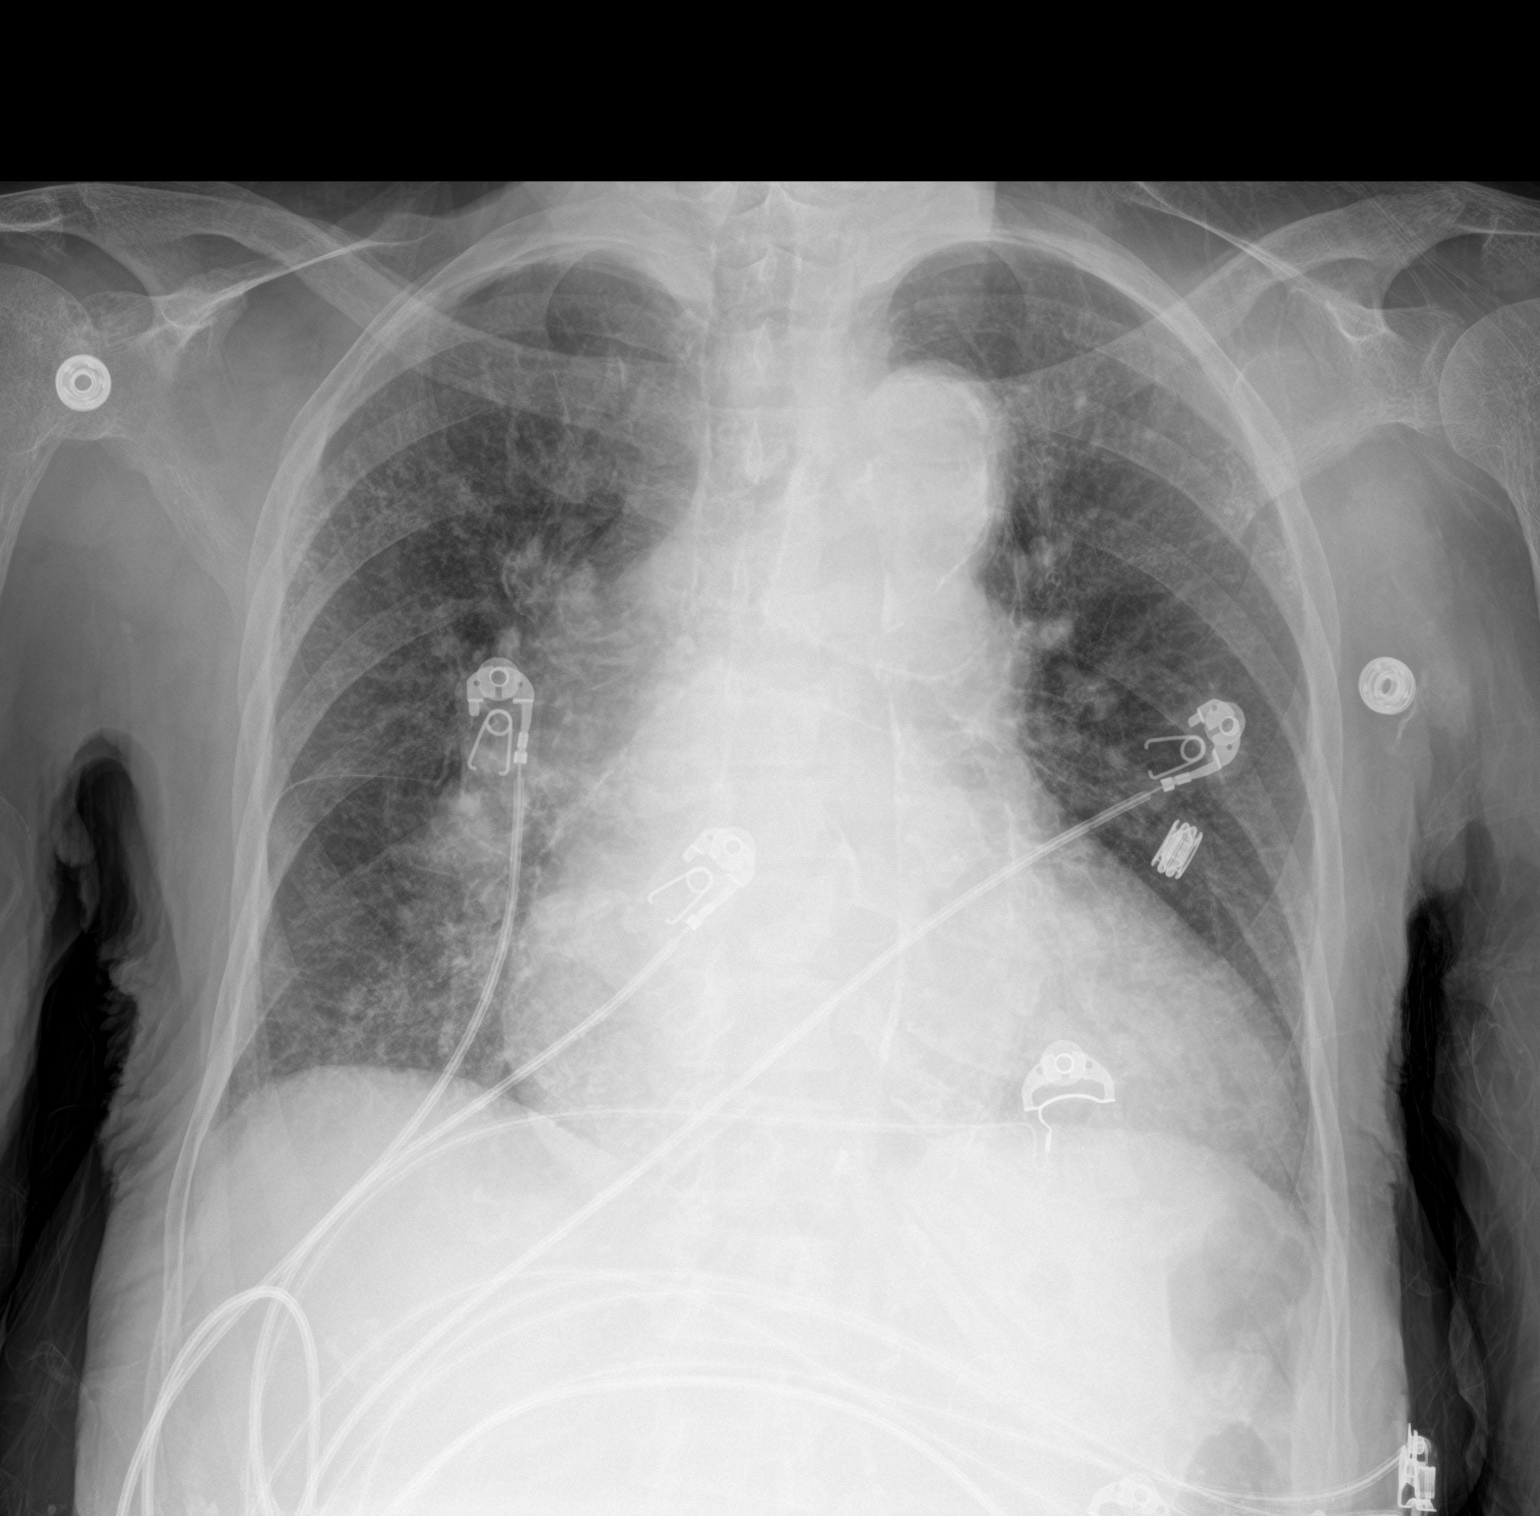

[2 of 2 positions shown; findings below may reference images not displayed]

FINDINGS: The heart size is enlarged. The mediastinal contour is stable. The
aorta is tortuous. There is mild diffuse increased pulmonary
interstitium bilaterally. There is no focal pneumonia or pleural
effusion. The visualized skeletal structures are unremarkable.
IMPRESSION: Mild congestive heart failure.

## 2022-07-27 DIAGNOSIS — R197 Diarrhea, unspecified: Secondary | ICD-10-CM | POA: Diagnosis not present

## 2022-07-27 DIAGNOSIS — N2581 Secondary hyperparathyroidism of renal origin: Secondary | ICD-10-CM | POA: Diagnosis not present

## 2022-07-27 DIAGNOSIS — Z992 Dependence on renal dialysis: Secondary | ICD-10-CM | POA: Diagnosis not present

## 2022-07-27 DIAGNOSIS — R52 Pain, unspecified: Secondary | ICD-10-CM | POA: Diagnosis not present

## 2022-07-27 DIAGNOSIS — N186 End stage renal disease: Secondary | ICD-10-CM | POA: Diagnosis not present

## 2022-07-27 DIAGNOSIS — D631 Anemia in chronic kidney disease: Secondary | ICD-10-CM | POA: Diagnosis not present

## 2022-07-27 DIAGNOSIS — L299 Pruritus, unspecified: Secondary | ICD-10-CM | POA: Diagnosis not present

## 2022-07-29 DIAGNOSIS — N186 End stage renal disease: Secondary | ICD-10-CM | POA: Diagnosis not present

## 2022-07-29 DIAGNOSIS — L299 Pruritus, unspecified: Secondary | ICD-10-CM | POA: Diagnosis not present

## 2022-07-29 DIAGNOSIS — R52 Pain, unspecified: Secondary | ICD-10-CM | POA: Diagnosis not present

## 2022-07-29 DIAGNOSIS — N2581 Secondary hyperparathyroidism of renal origin: Secondary | ICD-10-CM | POA: Diagnosis not present

## 2022-07-29 DIAGNOSIS — D631 Anemia in chronic kidney disease: Secondary | ICD-10-CM | POA: Diagnosis not present

## 2022-07-29 DIAGNOSIS — Z992 Dependence on renal dialysis: Secondary | ICD-10-CM | POA: Diagnosis not present

## 2022-07-29 DIAGNOSIS — R197 Diarrhea, unspecified: Secondary | ICD-10-CM | POA: Diagnosis not present

## 2022-07-31 DIAGNOSIS — Z992 Dependence on renal dialysis: Secondary | ICD-10-CM | POA: Diagnosis not present

## 2022-07-31 DIAGNOSIS — N2581 Secondary hyperparathyroidism of renal origin: Secondary | ICD-10-CM | POA: Diagnosis not present

## 2022-07-31 DIAGNOSIS — D631 Anemia in chronic kidney disease: Secondary | ICD-10-CM | POA: Diagnosis not present

## 2022-07-31 DIAGNOSIS — R197 Diarrhea, unspecified: Secondary | ICD-10-CM | POA: Diagnosis not present

## 2022-07-31 DIAGNOSIS — L299 Pruritus, unspecified: Secondary | ICD-10-CM | POA: Diagnosis not present

## 2022-07-31 DIAGNOSIS — R52 Pain, unspecified: Secondary | ICD-10-CM | POA: Diagnosis not present

## 2022-07-31 DIAGNOSIS — N186 End stage renal disease: Secondary | ICD-10-CM | POA: Diagnosis not present

## 2022-08-03 DIAGNOSIS — L299 Pruritus, unspecified: Secondary | ICD-10-CM | POA: Diagnosis not present

## 2022-08-03 DIAGNOSIS — D631 Anemia in chronic kidney disease: Secondary | ICD-10-CM | POA: Diagnosis not present

## 2022-08-03 DIAGNOSIS — N186 End stage renal disease: Secondary | ICD-10-CM | POA: Diagnosis not present

## 2022-08-03 DIAGNOSIS — N2581 Secondary hyperparathyroidism of renal origin: Secondary | ICD-10-CM | POA: Diagnosis not present

## 2022-08-03 DIAGNOSIS — R197 Diarrhea, unspecified: Secondary | ICD-10-CM | POA: Diagnosis not present

## 2022-08-03 DIAGNOSIS — R52 Pain, unspecified: Secondary | ICD-10-CM | POA: Diagnosis not present

## 2022-08-03 DIAGNOSIS — Z992 Dependence on renal dialysis: Secondary | ICD-10-CM | POA: Diagnosis not present

## 2022-08-05 DIAGNOSIS — D631 Anemia in chronic kidney disease: Secondary | ICD-10-CM | POA: Diagnosis not present

## 2022-08-05 DIAGNOSIS — N2581 Secondary hyperparathyroidism of renal origin: Secondary | ICD-10-CM | POA: Diagnosis not present

## 2022-08-05 DIAGNOSIS — L299 Pruritus, unspecified: Secondary | ICD-10-CM | POA: Diagnosis not present

## 2022-08-05 DIAGNOSIS — N186 End stage renal disease: Secondary | ICD-10-CM | POA: Diagnosis not present

## 2022-08-05 DIAGNOSIS — Z992 Dependence on renal dialysis: Secondary | ICD-10-CM | POA: Diagnosis not present

## 2022-08-05 DIAGNOSIS — R52 Pain, unspecified: Secondary | ICD-10-CM | POA: Diagnosis not present

## 2022-08-05 DIAGNOSIS — R197 Diarrhea, unspecified: Secondary | ICD-10-CM | POA: Diagnosis not present

## 2022-08-06 IMAGING — CR DG CHEST 2V
2 series · 2 of 2 positions shown · non-contrast
Comparison: Thirteen days ago

CLINICAL DATA: Chest pain

EXAM:
CHEST - 2 VIEW

[chest lat]
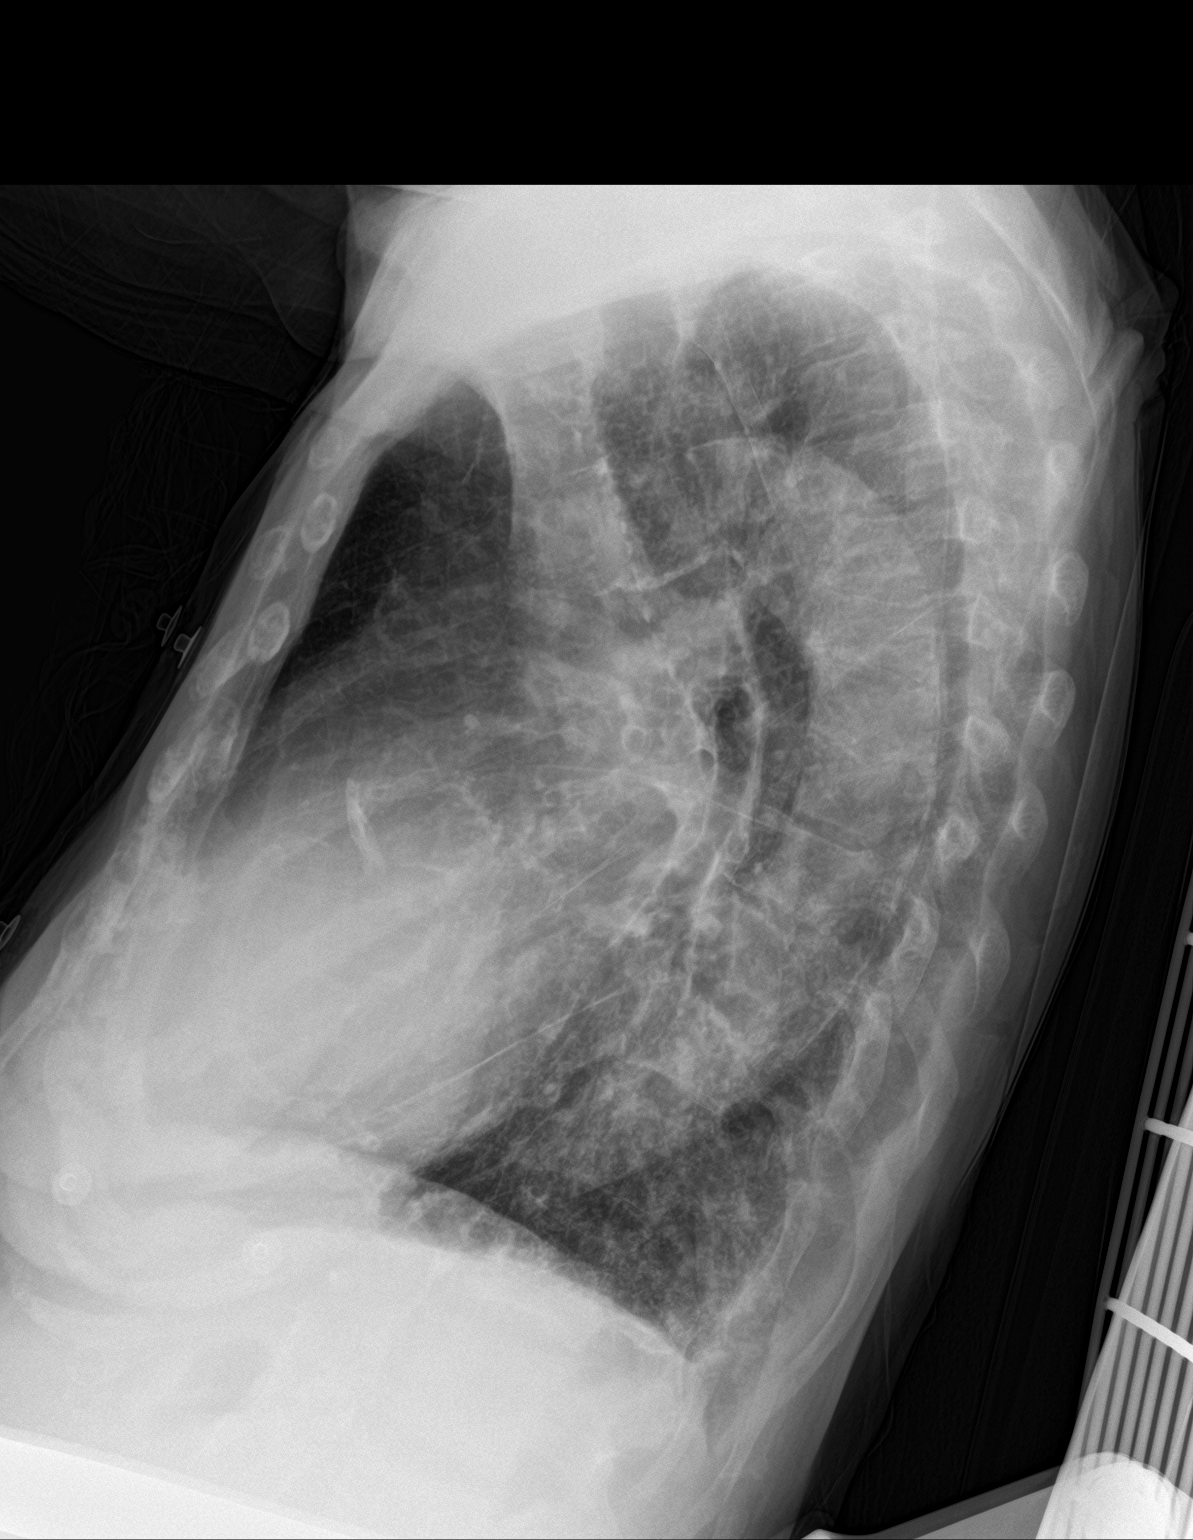

[chest ap]
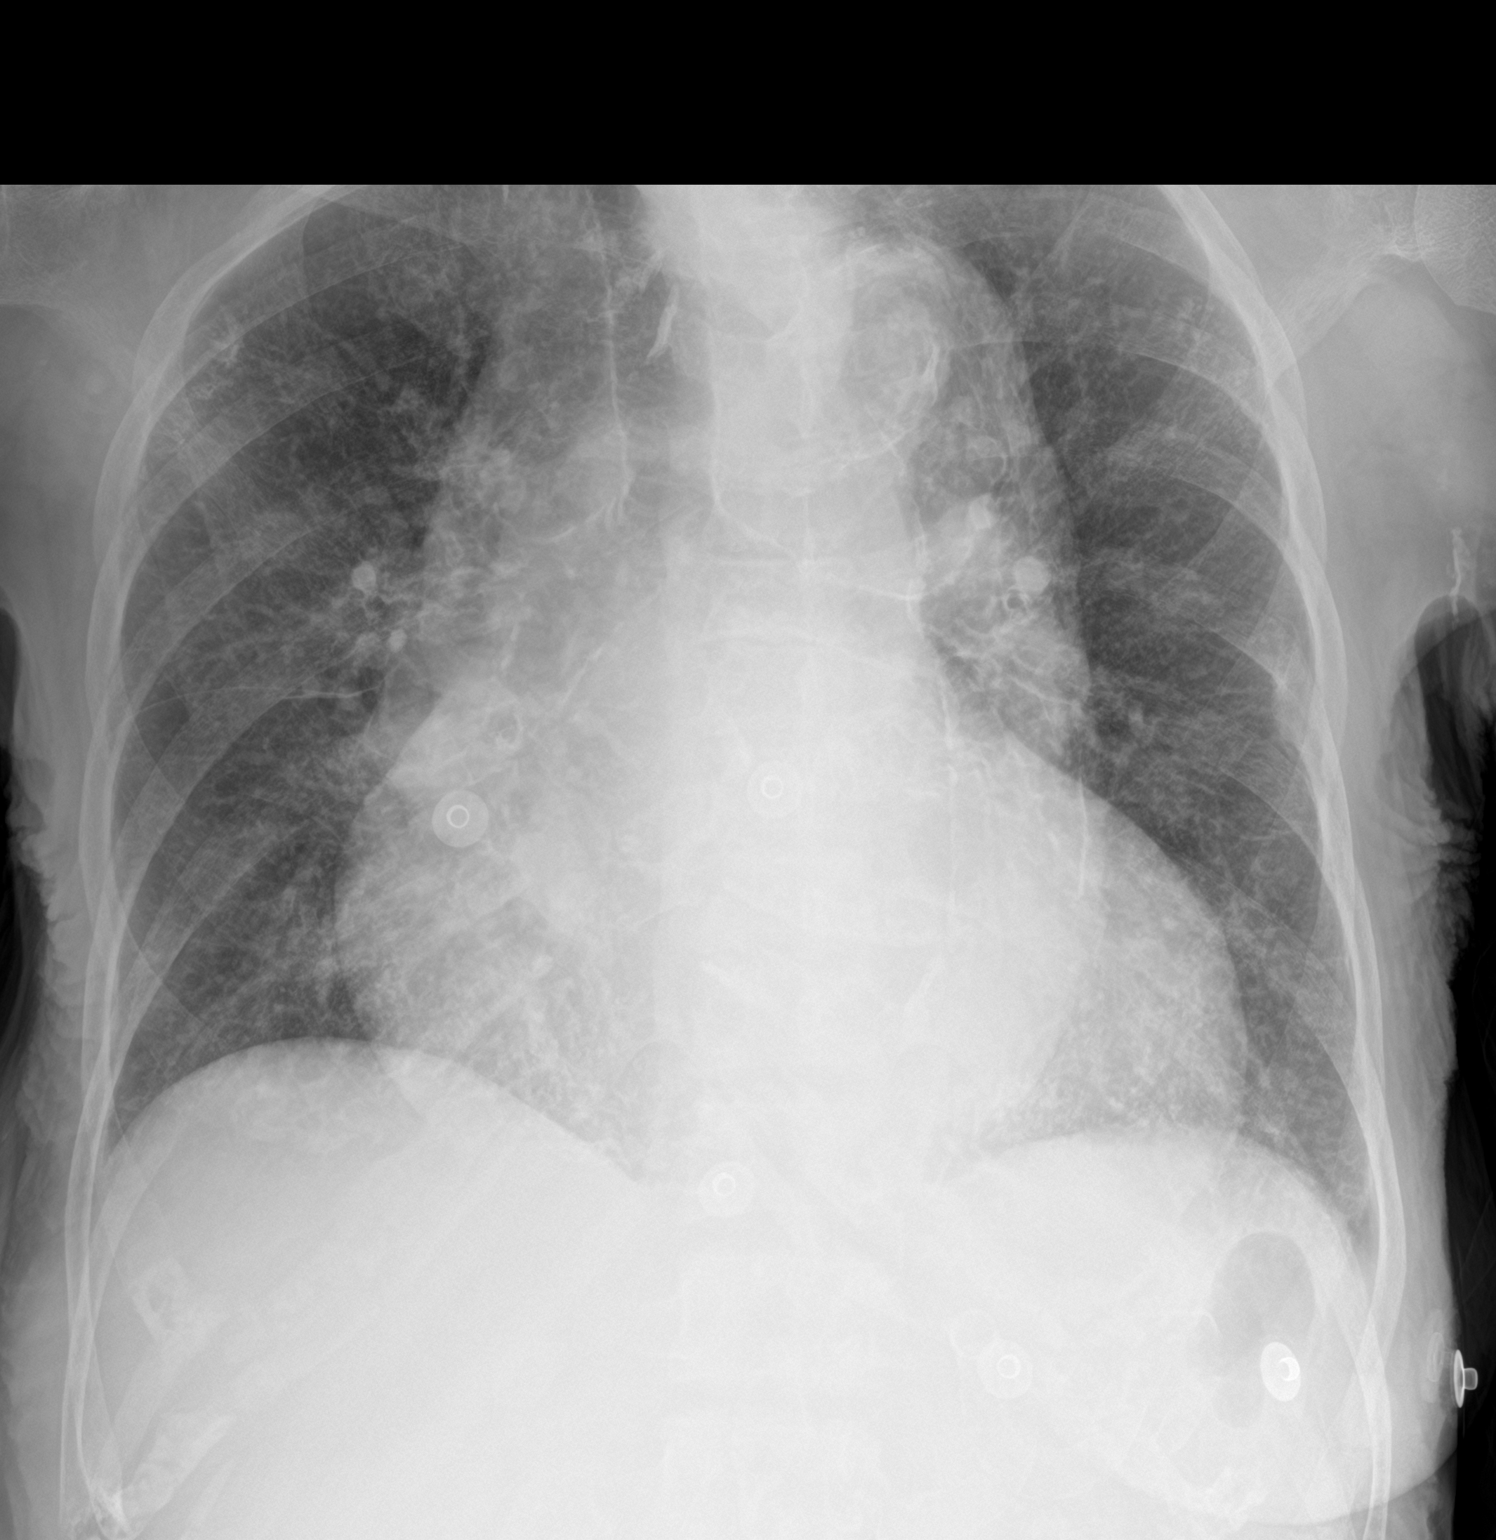

[2 of 2 positions shown; findings below may reference images not displayed]

FINDINGS: Cardiomegaly and aortic tortuosity. Stable generalized interstitial
coarsening. There is no edema, consolidation, effusion, or
pneumothorax. Large lung volumes.
IMPRESSION: 1. No acute finding when compared to prior.
2. Cardiomegaly and chronic lung disease.

## 2022-08-07 DIAGNOSIS — Z992 Dependence on renal dialysis: Secondary | ICD-10-CM | POA: Diagnosis not present

## 2022-08-07 DIAGNOSIS — N2581 Secondary hyperparathyroidism of renal origin: Secondary | ICD-10-CM | POA: Diagnosis not present

## 2022-08-07 DIAGNOSIS — R52 Pain, unspecified: Secondary | ICD-10-CM | POA: Diagnosis not present

## 2022-08-07 DIAGNOSIS — R197 Diarrhea, unspecified: Secondary | ICD-10-CM | POA: Diagnosis not present

## 2022-08-07 DIAGNOSIS — N186 End stage renal disease: Secondary | ICD-10-CM | POA: Diagnosis not present

## 2022-08-07 DIAGNOSIS — D631 Anemia in chronic kidney disease: Secondary | ICD-10-CM | POA: Diagnosis not present

## 2022-08-07 DIAGNOSIS — L299 Pruritus, unspecified: Secondary | ICD-10-CM | POA: Diagnosis not present

## 2022-08-10 DIAGNOSIS — R52 Pain, unspecified: Secondary | ICD-10-CM | POA: Diagnosis not present

## 2022-08-10 DIAGNOSIS — N2581 Secondary hyperparathyroidism of renal origin: Secondary | ICD-10-CM | POA: Diagnosis not present

## 2022-08-10 DIAGNOSIS — D631 Anemia in chronic kidney disease: Secondary | ICD-10-CM | POA: Diagnosis not present

## 2022-08-10 DIAGNOSIS — Z992 Dependence on renal dialysis: Secondary | ICD-10-CM | POA: Diagnosis not present

## 2022-08-10 DIAGNOSIS — L299 Pruritus, unspecified: Secondary | ICD-10-CM | POA: Diagnosis not present

## 2022-08-10 DIAGNOSIS — N186 End stage renal disease: Secondary | ICD-10-CM | POA: Diagnosis not present

## 2022-08-10 DIAGNOSIS — R197 Diarrhea, unspecified: Secondary | ICD-10-CM | POA: Diagnosis not present

## 2022-08-12 DIAGNOSIS — R197 Diarrhea, unspecified: Secondary | ICD-10-CM | POA: Diagnosis not present

## 2022-08-12 DIAGNOSIS — Z992 Dependence on renal dialysis: Secondary | ICD-10-CM | POA: Diagnosis not present

## 2022-08-12 DIAGNOSIS — N2581 Secondary hyperparathyroidism of renal origin: Secondary | ICD-10-CM | POA: Diagnosis not present

## 2022-08-12 DIAGNOSIS — L299 Pruritus, unspecified: Secondary | ICD-10-CM | POA: Diagnosis not present

## 2022-08-12 DIAGNOSIS — R52 Pain, unspecified: Secondary | ICD-10-CM | POA: Diagnosis not present

## 2022-08-12 DIAGNOSIS — N186 End stage renal disease: Secondary | ICD-10-CM | POA: Diagnosis not present

## 2022-08-12 DIAGNOSIS — D631 Anemia in chronic kidney disease: Secondary | ICD-10-CM | POA: Diagnosis not present

## 2022-08-13 DIAGNOSIS — N186 End stage renal disease: Secondary | ICD-10-CM | POA: Diagnosis not present

## 2022-08-13 DIAGNOSIS — Z992 Dependence on renal dialysis: Secondary | ICD-10-CM | POA: Diagnosis not present

## 2022-08-13 DIAGNOSIS — T8612 Kidney transplant failure: Secondary | ICD-10-CM | POA: Diagnosis not present

## 2022-08-14 DIAGNOSIS — N2581 Secondary hyperparathyroidism of renal origin: Secondary | ICD-10-CM | POA: Diagnosis not present

## 2022-08-14 DIAGNOSIS — L299 Pruritus, unspecified: Secondary | ICD-10-CM | POA: Diagnosis not present

## 2022-08-14 DIAGNOSIS — Z992 Dependence on renal dialysis: Secondary | ICD-10-CM | POA: Diagnosis not present

## 2022-08-14 DIAGNOSIS — D631 Anemia in chronic kidney disease: Secondary | ICD-10-CM | POA: Diagnosis not present

## 2022-08-14 DIAGNOSIS — N186 End stage renal disease: Secondary | ICD-10-CM | POA: Diagnosis not present

## 2022-08-14 DIAGNOSIS — R197 Diarrhea, unspecified: Secondary | ICD-10-CM | POA: Diagnosis not present

## 2022-08-18 ENCOUNTER — Other Ambulatory Visit: Payer: Self-pay

## 2022-08-18 ENCOUNTER — Emergency Department (HOSPITAL_COMMUNITY): Payer: Medicare Other

## 2022-08-18 ENCOUNTER — Ambulatory Visit: Payer: Medicare Other | Admitting: Vascular Surgery

## 2022-08-18 ENCOUNTER — Encounter (HOSPITAL_COMMUNITY): Payer: Self-pay | Admitting: Emergency Medicine

## 2022-08-18 ENCOUNTER — Emergency Department (HOSPITAL_COMMUNITY)
Admission: EM | Admit: 2022-08-18 | Discharge: 2022-08-18 | Disposition: A | Discharge status: Home / Self Care | Payer: Medicare Other | Attending: Emergency Medicine | Admitting: Emergency Medicine

## 2022-08-18 DIAGNOSIS — I132 Hypertensive heart and chronic kidney disease with heart failure and with stage 5 chronic kidney disease, or end stage renal disease: Secondary | ICD-10-CM | POA: Diagnosis not present

## 2022-08-18 DIAGNOSIS — Z79899 Other long term (current) drug therapy: Secondary | ICD-10-CM | POA: Insufficient documentation

## 2022-08-18 DIAGNOSIS — R112 Nausea with vomiting, unspecified: Secondary | ICD-10-CM | POA: Insufficient documentation

## 2022-08-18 DIAGNOSIS — E8779 Other fluid overload: Secondary | ICD-10-CM | POA: Diagnosis not present

## 2022-08-18 DIAGNOSIS — R197 Diarrhea, unspecified: Secondary | ICD-10-CM | POA: Insufficient documentation

## 2022-08-18 DIAGNOSIS — Z20822 Contact with and (suspected) exposure to covid-19: Secondary | ICD-10-CM | POA: Insufficient documentation

## 2022-08-18 DIAGNOSIS — R944 Abnormal results of kidney function studies: Secondary | ICD-10-CM | POA: Insufficient documentation

## 2022-08-18 DIAGNOSIS — Z7902 Long term (current) use of antithrombotics/antiplatelets: Secondary | ICD-10-CM | POA: Diagnosis not present

## 2022-08-18 DIAGNOSIS — Z992 Dependence on renal dialysis: Secondary | ICD-10-CM | POA: Diagnosis not present

## 2022-08-18 DIAGNOSIS — I120 Hypertensive chronic kidney disease with stage 5 chronic kidney disease or end stage renal disease: Secondary | ICD-10-CM | POA: Diagnosis not present

## 2022-08-18 DIAGNOSIS — I509 Heart failure, unspecified: Secondary | ICD-10-CM | POA: Insufficient documentation

## 2022-08-18 DIAGNOSIS — E875 Hyperkalemia: Secondary | ICD-10-CM | POA: Diagnosis not present

## 2022-08-18 DIAGNOSIS — R0602 Shortness of breath: Secondary | ICD-10-CM | POA: Diagnosis not present

## 2022-08-18 DIAGNOSIS — R5383 Other fatigue: Secondary | ICD-10-CM | POA: Diagnosis not present

## 2022-08-18 DIAGNOSIS — N186 End stage renal disease: Secondary | ICD-10-CM | POA: Diagnosis not present

## 2022-08-18 DIAGNOSIS — R140 Abdominal distension (gaseous): Secondary | ICD-10-CM | POA: Diagnosis not present

## 2022-08-18 LAB — CBC WITH DIFFERENTIAL/PLATELET
Abs Immature Granulocytes: 0.03 10*3/uL (ref 0.00–0.07)
Basophils Absolute: 0 10*3/uL (ref 0.0–0.1)
Basophils Relative: 0 %
Eosinophils Absolute: 0.1 10*3/uL (ref 0.0–0.5)
Eosinophils Relative: 1 %
HCT: 34.7 % — ABNORMAL LOW (ref 36.0–46.0)
Hemoglobin: 11.5 g/dL — ABNORMAL LOW (ref 12.0–15.0)
Immature Granulocytes: 0 %
Lymphocytes Relative: 29 %
Lymphs Abs: 2 10*3/uL (ref 0.7–4.0)
MCH: 32 pg (ref 26.0–34.0)
MCHC: 33.1 g/dL (ref 30.0–36.0)
MCV: 96.7 fL (ref 80.0–100.0)
Monocytes Absolute: 1 10*3/uL (ref 0.1–1.0)
Monocytes Relative: 14 %
Neutro Abs: 3.8 10*3/uL (ref 1.7–7.7)
Neutrophils Relative %: 56 %
Platelets: 200 10*3/uL (ref 150–400)
RBC: 3.59 MIL/uL — ABNORMAL LOW (ref 3.87–5.11)
RDW: 18.7 % — ABNORMAL HIGH (ref 11.5–15.5)
WBC: 6.8 10*3/uL (ref 4.0–10.5)
nRBC: 0 % (ref 0.0–0.2)

## 2022-08-18 LAB — HEPATITIS B SURFACE ANTIBODY,QUALITATIVE: Hep B S Ab: REACTIVE — AB

## 2022-08-18 LAB — HEPATITIS B CORE ANTIBODY, TOTAL: Hep B Core Total Ab: NONREACTIVE

## 2022-08-18 LAB — PROTIME-INR
INR: 2 — ABNORMAL HIGH (ref 0.8–1.2)
Prothrombin Time: 22.6 seconds — ABNORMAL HIGH (ref 11.4–15.2)

## 2022-08-18 LAB — RESP PANEL BY RT-PCR (FLU A&B, COVID) ARPGX2
Influenza A by PCR: NEGATIVE
Influenza B by PCR: NEGATIVE
SARS Coronavirus 2 by RT PCR: NEGATIVE

## 2022-08-18 LAB — HEPATITIS B SURFACE ANTIGEN: Hepatitis B Surface Ag: NONREACTIVE

## 2022-08-18 LAB — TROPONIN I (HIGH SENSITIVITY)
Troponin I (High Sensitivity): 23 ng/L — ABNORMAL HIGH (ref <18)
Troponin I (High Sensitivity): 26 ng/L — ABNORMAL HIGH (ref <18)

## 2022-08-18 LAB — HEPATITIS C ANTIBODY: HCV Ab: NONREACTIVE

## 2022-08-18 IMAGING — DX DG CHEST 2V
2 series · 2 of 2 positions shown · non-contrast
Comparison: 12/28/2021

CLINICAL DATA: Shortness of breath

EXAM:
CHEST - 2 VIEW

[chest pa]
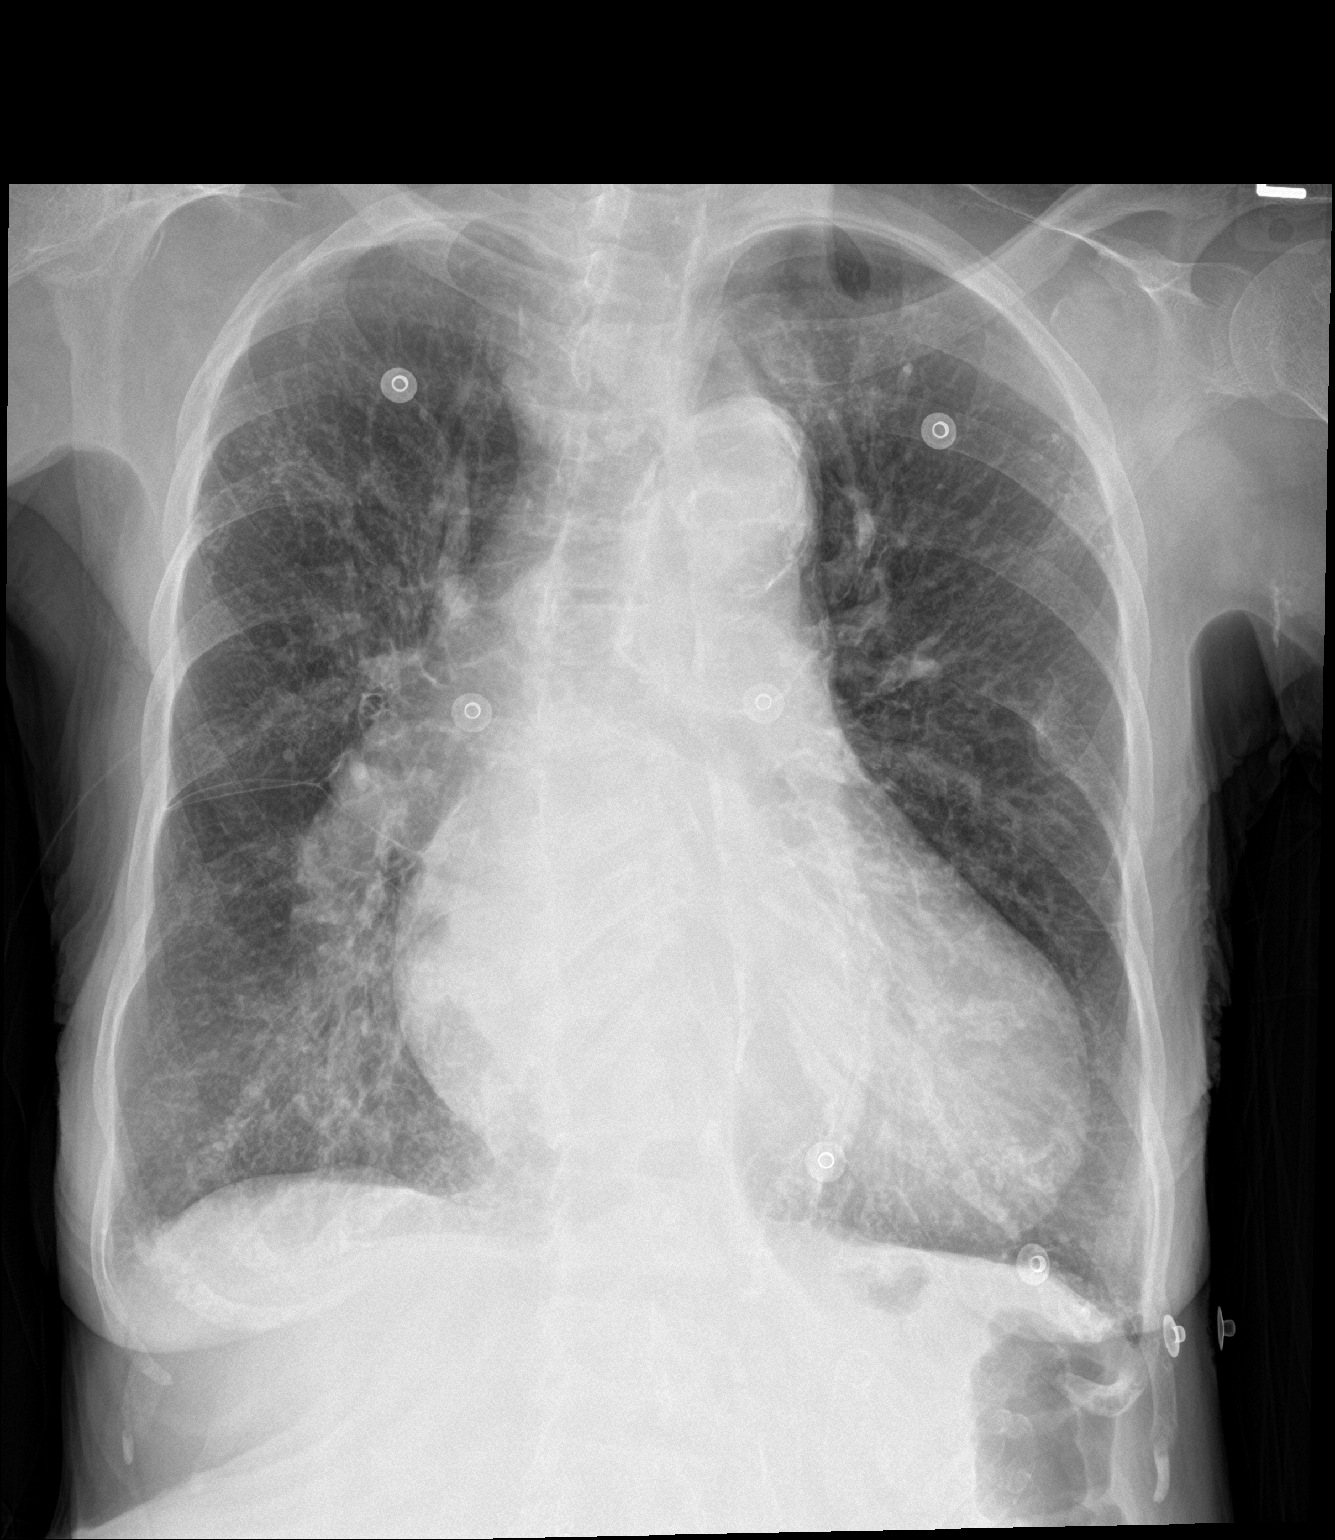

[chest lat]
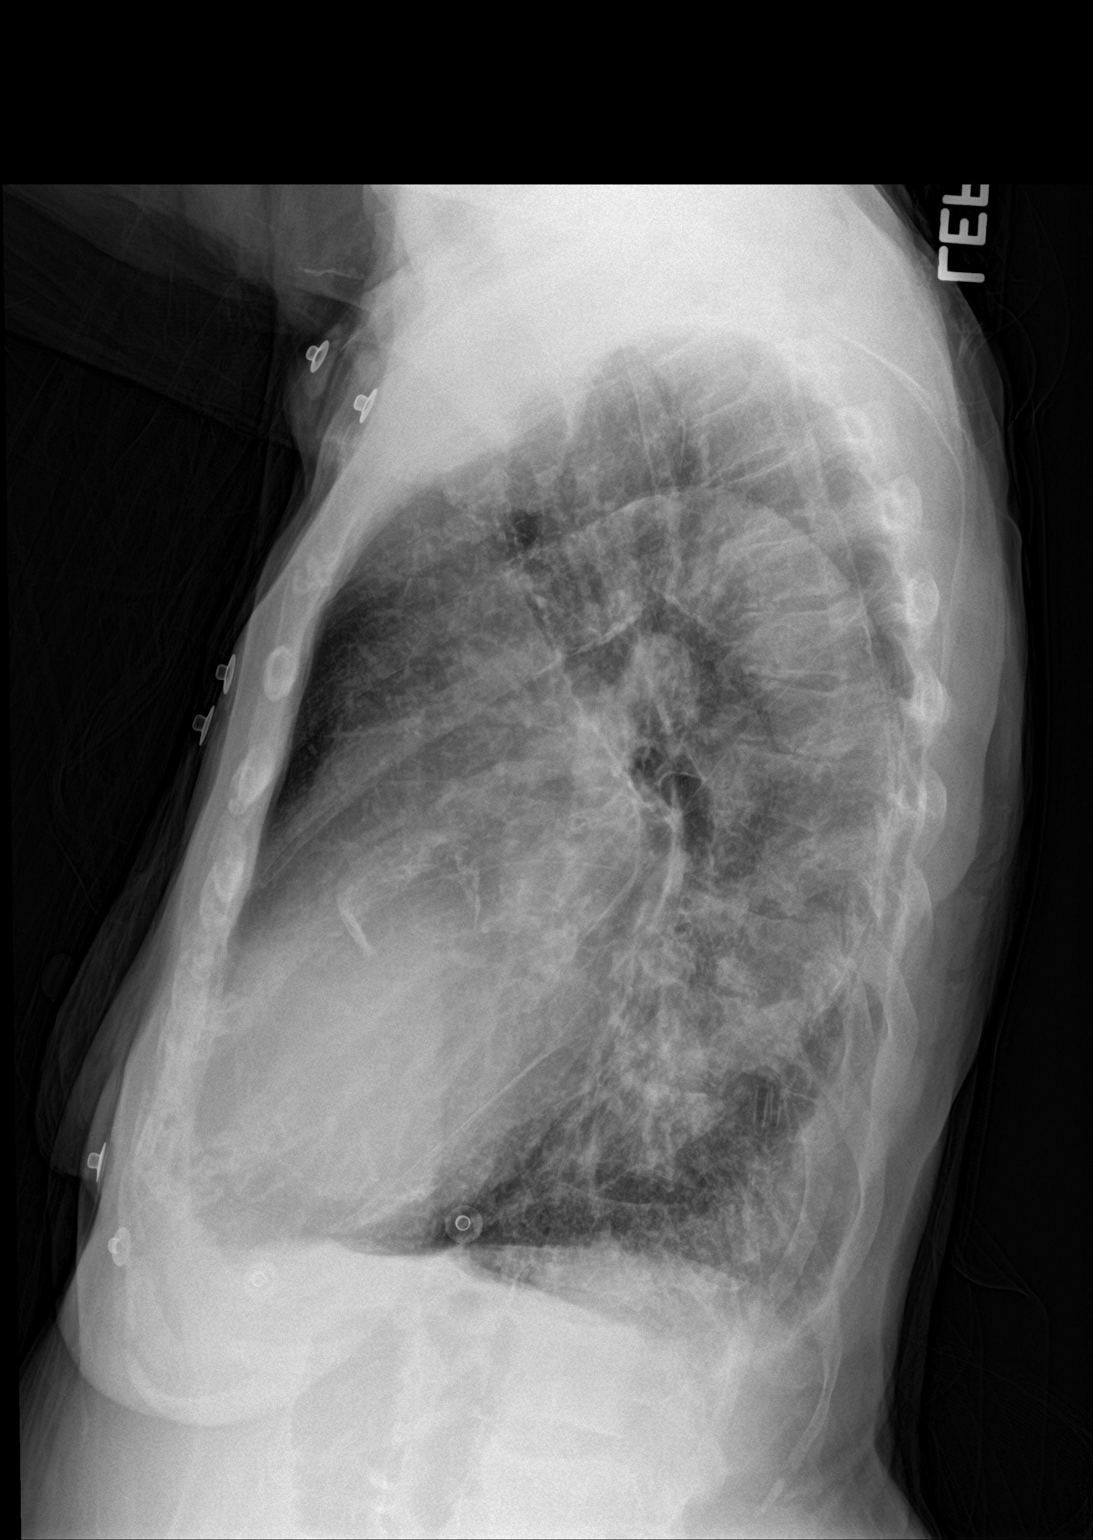

[2 of 2 positions shown; findings below may reference images not displayed]

FINDINGS: Cardiomegaly and aortic tortuosity with diffuse aortic
calcification. Congested appearance of vessels diffusely. No visible
effusion. Lung volumes are chronically large. Prominent
calcification at the aortic annulus and throughout the arch and
descending segment.
IMPRESSION: Cardiomegaly and vascular congestion.

## 2022-08-18 MED ORDER — SODIUM ZIRCONIUM CYCLOSILICATE 10 G PO PACK
10.0000 g | PACK | Freq: Once | ORAL | Status: AC
  Administered 2022-08-18: 10 g via ORAL
  Filled 2022-08-18: qty 1

## 2022-08-18 MED ORDER — CHLORHEXIDINE GLUCONATE CLOTH 2 % EX PADS: 6.0000 | MEDICATED_PAD | Freq: Every day | CUTANEOUS | Status: DC

## 2022-08-18 MED ORDER — SODIUM CHLORIDE 0.9 % IV SOLN
6.2500 mg | Freq: Once | INTRAVENOUS | Status: DC
  Filled 2022-08-18: qty 0.25

## 2022-08-18 MED ORDER — DIPHENHYDRAMINE HCL 25 MG PO CAPS
25.0000 mg | ORAL_CAPSULE | Freq: Once | ORAL | Status: AC
  Administered 2022-08-18: 25 mg via ORAL

## 2022-08-18 MED ORDER — DIPHENHYDRAMINE HCL 25 MG PO CAPS
ORAL_CAPSULE | ORAL | Status: AC
  Filled 2022-08-18: qty 1

## 2022-08-18 NOTE — ED Notes (Signed)
Patient transported to X-ray 

## 2022-08-18 NOTE — Progress Notes (Signed)
Hemodialysis- Tolerated well. 1.4L removed. Goal reduced x 1 due to hypotension. BP kept >71 systolic. Patient asymptomatic throughout. Reported off to [facility] in ED. Will send back. Patient currently without pain/complaints.

## 2022-08-18 NOTE — Procedures (Signed)
   I was present at this dialysis session, have reviewed the session itself and made  appropriate changes Kelly Splinter MD [place] pager [phone]   08/18/2022, 3:35 PM

## 2022-08-18 NOTE — ED Provider Notes (Signed)
[facility] EMERGENCY DEPARTMENT Provider Note   CSN: [id number] Arrival date & time: 08/18/22  4193     History  Chief Complaint  Patient presents with   SOB / MIssed hemodialysis treatment   HPI [name] is a 71 y.o. female with end-stage renal disease on hemodialysis, hypertension, stroke, CHF senting for shortness of breath.  Stated that her shortness of breath started yesterday.  She missed her dialysis yesterday due to ongoing nausea, vomiting, diarrhea.  Since then she just "cannot catch her breath".  Denies chest pain.  Denies fever.  States that she no longer makes urine.  Denies abdominal pain or flank tenderness.       Home Medications Prior to Admission medications   Medication Sig Start Date End Date Taking? Authorizing Provider  acetaminophen (TYLENOL) 500 MG tablet Take 1 tablet (500 mg total) by mouth every 6 (six) hours as needed. Patient taking differently: Take 500 mg by mouth every 6 (six) hours as needed (pain). 09/05/18   Law, Bea Graff, PA-C  albuterol (PROVENTIL) (2.5 MG/3ML) 0.083% nebulizer solution Take 3 mLs (2.5 mg total) by nebulization every 4 (four) hours as needed for wheezing. Patient taking differently: Take 2.5 mg by nebulization every 6 (six) hours as needed for wheezing. 05/04/22   Elgergawy, Silver Huguenin, MD  albuterol (VENTOLIN HFA) 108 (90 Base) MCG/ACT inhaler Inhale 1-2 puffs into the lungs every 6 (six) hours as needed for wheezing or shortness of breath. Patient taking differently: Inhale 2 puffs into the lungs every 6 (six) hours as needed for shortness of breath. 01/11/22   Mesner, Corene Cornea, MD  atorvastatin (LIPITOR) 80 MG tablet Take 80 mg by mouth at bedtime. 03/25/22   [provider]  B Complex-C-Folic Acid (DIALYVITE 790) 0.8 MG WAFR Take 1 tablet by mouth every morning. 01/28/22   [provider]  clopidogrel (PLAVIX) 75 MG tablet Take 1 tablet (75 mg total) by mouth daily. Patient taking  differently: Take 75 mg by mouth at bedtime. 03/26/22 03/26/23  Marty Heck, MD  doxycycline (VIBRA-TABS) 100 MG tablet Take 1 tablet (100 mg total) by mouth 2 (two) times daily. 07/02/22   Laurice Record, MD  escitalopram (LEXAPRO) 10 MG tablet Take 10 mg by mouth every morning. 03/25/22   [provider]  hydrOXYzine (ATARAX) 25 MG tablet Take 1 tablet (25 mg total) by mouth every 8 (eight) hours as needed for itching. 05/21/22   Mercy Riding, MD  midodrine (PROAMATINE) 10 MG tablet Take 1 tablet (10 mg total) by mouth 3 (three) times daily with meals. 05/21/22   Mercy Riding, MD  Nutritional Supplements (NEPRO) LIQD Take 237 mLs by mouth 2 (two) times daily between meals.    [provider]  ondansetron (ZOFRAN) 8 MG tablet Take 8 mg by mouth every 12 (twelve) hours as needed for nausea or vomiting.    [provider]  OVER THE COUNTER MEDICATION Take 1 tablet by mouth every morning. Folic acid - vitamin B complex - vitamin C - selenium - zinc 3 mg    [provider]  pantoprazole (PROTONIX) 40 MG tablet Take 1 tablet (40 mg total) by mouth daily. 08/18/21   Nita Sells, MD  sevelamer (RENAGEL) 800 MG tablet Take 1,600 mg by mouth 3 (three) times daily with meals. 03/24/22   [provider]  zolpidem (AMBIEN) 5 MG tablet Take 1 tablet (5 mg total) by mouth at bedtime as needed for up to 10  days for sleep (Insomnia). 05/21/22 05/31/22  Mercy Riding, MD      Allergies    Sulfa antibiotics and Adhesive [tape]    Review of Systems   Review of Systems  Respiratory:  Positive for shortness of breath.     Physical Exam Updated Vital Signs BP (!) 104/57   Pulse 72   Temp 97.8 F (36.6 C) (Oral)   Resp 13   Wt 47.5 kg   SpO2 98%   BMI 18.85 kg/m  Physical Exam Vitals and nursing note reviewed.  HENT:     Head: Normocephalic and atraumatic.     Mouth/Throat:     Mouth: Mucous membranes are moist.  Eyes:     General:        Right  eye: No discharge.        Left eye: No discharge.     Conjunctiva/sclera: Conjunctivae normal.  Cardiovascular:     Rate and Rhythm: Normal rate and regular rhythm.     Pulses: Normal pulses.     Heart sounds: Normal heart sounds.  Pulmonary:     Effort: Pulmonary effort is normal. No respiratory distress.     Breath sounds: Normal breath sounds. No wheezing, rhonchi or rales.  Abdominal:     General: Abdomen is flat and protuberant.     Palpations: Abdomen is soft.     Tenderness: There is no abdominal tenderness.  Skin:    General: Skin is warm and dry.  Neurological:     General: No focal deficit present.  Psychiatric:        Mood and Affect: Mood normal.     ED Results / Procedures / Treatments   Labs (all labs ordered are listed, but only abnormal results are displayed) Labs Reviewed  CBC WITH DIFFERENTIAL/PLATELET - Abnormal; Notable for the following components:      Result Value   RBC 3.59 (*)    Hemoglobin 11.5 (*)    HCT 34.7 (*)    RDW 18.7 (*)    All other components within normal limits  COMPREHENSIVE METABOLIC PANEL - Abnormal; Notable for the following components:   Potassium 6.0 (*)    Chloride 97 (*)    CO2 13 (*)    Glucose, Bld 103 (*)    BUN 81 (*)    Creatinine, Ser 7.72 (*)    Albumin 3.2 (*)    AST 65 (*)    Alkaline Phosphatase 220 (*)    Total Bilirubin 1.7 (*)    GFR, Estimated 5 (*)    Anion gap 26 (*)    All other components within normal limits  PROTIME-INR - Abnormal; Notable for the following components:   Prothrombin Time 22.6 (*)    INR 2.0 (*)    All other components within normal limits  TROPONIN I (HIGH SENSITIVITY) - Abnormal; Notable for the following components:   Troponin I (High Sensitivity) 23 (*)    All other components within normal limits  TROPONIN I (HIGH SENSITIVITY) - Abnormal; Notable for the following components:   Troponin I (High Sensitivity) 26 (*)    All other components within normal limits  RESP PANEL BY  RT-PCR (FLU A&B, COVID) ARPGX2  HEPATITIS B SURFACE ANTIGEN  HEPATITIS B SURFACE ANTIBODY,QUALITATIVE  HEPATITIS B SURFACE ANTIBODY, QUANTITATIVE  HEPATITIS B CORE ANTIBODY, TOTAL  HEPATITIS C ANTIBODY    EKG EKG Interpretation  Date/Time:  Tuesday August 18 2022 06:34:02 EDT Ventricular Rate:  72 PR Interval:  226 QRS  Duration: 98 QT Interval:  492 QTC Calculation: 538 R Axis:   103 Text Interpretation: Sinus rhythm with sinus arrhythmia with 1st degree A-V block Rightward axis Prolonged QT Abnormal ECG When compared with ECG of 13-Jul-2022 05:30, No significant change since last tracing Confirmed by Pattricia Boss [phone]) on 08/18/2022 8:10:18 AM  Radiology DG Abdomen 1 View  Result Date: 08/18/2022 CLINICAL DATA:  Abdominal distention EXAM: ABDOMEN - 1 VIEW COMPARISON:  05/18/2022 FINDINGS: Gas-filled, nonobstructed loops of bowel throughout the abdomen and pelvis. No obvious free air on supine radiograph. No large burden of stool. IMPRESSION: Gas-filled, nonobstructed loops of bowel throughout the abdomen and pelvis. No obvious free air on supine radiograph. No large burden of stool. Electronically Signed   By: Delanna Ahmadi M.D.   On: 08/18/2022 10:40   DG Chest 2 View  Result Date: 08/18/2022 CLINICAL DATA:  Shortness of breath. EXAM: CHEST - 2 VIEW COMPARISON:  07/13/2022 FINDINGS: Lungs are hyperexpanded. The cardio pericardial silhouette is enlarged. Interstitial markings are diffusely coarsened with chronic features. Irregular linear density in the peripheral right upper lobe identified previously is stable in the interval. Bones are diffusely demineralized. IMPRESSION: Hyperexpansion with chronic interstitial changes. No acute cardiopulmonary findings. Stable irregular linear density in the peripheral right upper lobe, likely scar. Follow-up chest x-ray in 3 months recommended to ensure continued stability. Electronically Signed   By: Misty Stanley M.D.   On: 08/18/2022 07:07     Procedures Procedures    Medications Ordered in ED Medications  Chlorhexidine Gluconate Cloth 2 % PADS 6 each (has no administration in time range)  promethazine (PHENERGAN) 6.25 mg in sodium chloride 0.9 % 50 mL IVPB (has no administration in time range)  sodium zirconium cyclosilicate (LOKELMA) packet 10 g (10 g Oral Given 08/18/22 6045)    ED Course/ Medical Decision Making/ A&P                           Medical Decision Making Amount and/or Complexity of Data Reviewed Labs: ordered. Radiology: ordered.  Risk Prescription drug management. Decision regarding hospitalization.   This patient presents to the ED for concern of SOB, this involves a number of treatment options, and is a complaint that carries with it a high risk of complications and morbidity.  The differential diagnosis includes shortness of breath related to volume overload, ACS, and CHF exacerbation.   Co morbidities: Discussed in HPI   EMR reviewed including pt PMHx, past surgical history and past visits to ER.   See HPI for more details   Lab Tests:   I ordered and independently interpreted labs. Labs notable for hyperkalemia, anemia severely elevated BUN and creatinine   Imaging Studies:  NAD. I personally reviewed all imaging studies and no acute abnormality found. I agree with radiology interpretation.  No acute findings on KUB or chest x-ray.    Cardiac Monitoring:  The patient was maintained on a cardiac monitor.  I personally viewed and interpreted the cardiac monitored which showed an underlying rhythm of: Normal sinus rhythm  QTc prolongation noted on EKG but otherwise nonischemic.   Medicines ordered:  I ordered medication including Lokelma for hyperkalemia and Phenergan for nausea vomiting Reevaluation of the patient after these medicines showed that the patient stayed the same I have reviewed the patients home medicines and have made adjustments as  needed     Consults/Attending Physician   I requested consultation with Dr. Melvia Heaps,  and discussed lab  and imaging findings as well as pertinent plan - they recommend: Admission to the hospital for dialysis and ongoing management for persistent nausea, vomiting, diarrhea and abdominal discomfort.  I also requested consultation with Dr. Karmen Bongo who coordinated with Dr. Soyla Murphy dialysis for this patient and also facilitated admission to the hospital.  Reevaluation:  After the interventions noted above I re-evaluated patient and found that they have :stayed the same    Problem List / ED Course: Patient presented for shortness of breath and persistent nausea, vomiting, and diarrhea.  Patient reported that she did miss her dialysis yesterday because she was too sick.  On exam she was mildly tachypneic and did require [place] O2 for a brief time on arrival.  Patient continued to vomit and have diarrhea during encounter.  The constellation of shortness of breath related to missed dialysis and renal failure and persistent nausea, vomiting, diarrhea and abdominal discomfort prompted consultation with nephrology and inpatient medical team.  It was mutually agreed upon to admit her to the hospital for ongoing management.    Dispostion:  After consideration of the diagnostic results and the patients response to treatment, I feel that the patent would benefit from admission to the hospital for ongoing management for electrolyte derangement, shortness of breath, persistent nausea, vomiting, diarrhea.         Final Clinical Impression(s) / ED Diagnoses Final diagnoses:  Shortness of breath  Nausea and vomiting, unspecified vomiting type  Diarrhea, unspecified type    Rx / DC Orders ED Discharge Orders     None         Harriet Pho, PA-C 08/18/22 1231    Pattricia Boss, MD 08/18/22 [phone]

## 2022-08-18 NOTE — ED Triage Notes (Signed)
Patient missed her hemodialysis treatment yesterday reports SOB/chest tightness this morning with diarrhea .

## 2022-08-18 NOTE — Progress Notes (Signed)
  Progress Note   Patient: [name] EZM:[id number] DOB: November 07, 1951 DOA: 08/18/2022 DOS: the patient was seen and examined on 08/18/2022 PCP: [name], MD    Chief Complaint: SOB  HPI: [name] is a 71 y.o. female with medical history significant of CVA; chronic diastolic CHF; CAD ESRD on MWF HD; PVD s/p stent; HTN; and HLD presenting with SOB after missed HD.    ER Course:  Missed HD yesterday, here with SOB.  Had n/v yesterday, too sick to go.  Has body wall/pulm edema.  K+ 6.0.  Given Lokelma.  Persistent diarrhea, no further emesis.  COVID pending.  No PO since 2 days ago.     Radiological Exams on Admission: Independently reviewed - see discussion in A/P where applicable  DG Abdomen 1 View  Result Date: 08/18/2022 CLINICAL DATA:  Abdominal distention EXAM: ABDOMEN - 1 VIEW COMPARISON:  05/18/2022 FINDINGS: Gas-filled, nonobstructed loops of bowel throughout the abdomen and pelvis. No obvious free air on supine radiograph. No large burden of stool. IMPRESSION: Gas-filled, nonobstructed loops of bowel throughout the abdomen and pelvis. No obvious free air on supine radiograph. No large burden of stool. Electronically Signed   By: Delanna Ahmadi M.D.   On: 08/18/2022 10:40   DG Chest 2 View  Result Date: 08/18/2022 CLINICAL DATA:  Shortness of breath. EXAM: CHEST - 2 VIEW COMPARISON:  07/13/2022 FINDINGS: Lungs are hyperexpanded. The cardio pericardial silhouette is enlarged. Interstitial markings are diffusely coarsened with chronic features. Irregular linear density in the peripheral right upper lobe identified previously is stable in the interval. Bones are diffusely demineralized. IMPRESSION: Hyperexpansion with chronic interstitial changes. No acute cardiopulmonary findings. Stable irregular linear density in the peripheral right upper lobe, likely scar. Follow-up chest x-ray in 3 months recommended to ensure continued stability. Electronically Signed   By: Misty Stanley M.D.   On: 08/18/2022 07:07     Patient may improve after HD and not require admission.  She is currently in HD.  After completion of HD, she will return to the ER for repeat evaluation.  If admission is still needed at that time, [facility] will be happy to consult.   Author: Karmen Bongo, MD 08/18/2022 11:07 AM  For on call review [email or web address].

## 2022-08-18 NOTE — Discharge Instructions (Signed)
Please take fluids at home as per your kidney failure instructions. Resume usual dialysis as instructed. Return if your are worse at any time

## 2022-08-18 NOTE — Consult Note (Addendum)
Renal Service Consult Note [facility]  [name] 08/18/2022 [name], MD Requesting Physician: Dr. Jeanell Sparrow, ED  Reason for Consult: ESRD pt w/ SOB HPI: The patient is a 71 y.o. year-old w/ hx of CVA, anemia , anxiety/ depression, ESRD on HD, GIB, HL who presented to ED today c/o SOB and chest tightness this morning. Also some diarrhea. She missed HD yesterday, usual MWF, last HD 9/1. In ED CXR w/o edema, BP's 110/ 75, HR 80, RR 18, afeb. 3L [place] at 95% but now on RA at 94%. We are asked to see her for dialysis.   Pt seen in ED room. Pt c/o n/v/d for days. No abd pain or fevers, no cough or chills.  Hx of Cdif. Placed on contact in ED.   ROS - no joint pain, no HA, no blurry vision, no rash, no dysuria, no difficulty voiding   Past Medical History  Past Medical History:  Diagnosis Date   Acute ischemic stroke ([place]) 02/23/2017   Acute on chronic diastolic CHF (congestive heart failure) ([place])    Adenomatous polyp of colon 10/2010, 2006, 2015   Anemia in CKD (chronic kidney disease) 11/07/2012   s/p blood transfusion.    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  Had some early rejection then stable function for years, then had slow decline of function and went back on hemodialysis in 2012.  Gets HD TTS schedule at [facility] on [facility] still using L forearm AVF.      GERD (gastroesophageal reflux disease)    GI bleed 2017   felt to be ischemic colitis, last colo 2015   Hyperlipidemia    Hypertension    Neurologic gait dysfunction    Neuromuscular disorder (HCC)    neuropathy hand and legs   NSTEMI (non-ST elevated myocardial  infarction) ([place]) 05/14/2020   Osteoporosis    Pneumonia    Pseudoaneurysm of surgical AV fistula (HCC)    left upper arm   Past Surgical History  Past Surgical History:  Procedure Laterality Date   ABDOMINAL AORTOGRAM W/LOWER EXTREMITY N/A 03/26/2022   Procedure: ABDOMINAL AORTOGRAM W/LOWER EXTREMITY;  Surgeon: Marty Heck, MD;  Location: [place] [facility];  Service: Cardiovascular;  Laterality: N/A;  Left   AV FISTULA PLACEMENT     for dialysis   AV FISTULA PLACEMENT Left 11/22/2015   Procedure: ARTERIOVENOUS (AV) FISTULA CREATION-LEFT BRACHIOCEPHALIC;  Surgeon: Serafina Mitchell, MD;  Location: [place];  Service: Vascular;  Laterality: Left;   AV FISTULA PLACEMENT Right 03/15/2020   Procedure: INSERTION OF ARTERIOVENOUS (AV) GORE-TEX GRAFT ARM ( BRACHIAL AXILLARY );  Surgeon: Katha Cabal, MD;  Location: [facility];  Service: Vascular;  Laterality: Right;   AV FISTULA PLACEMENT Right 05/15/2022   Procedure: EXPLORATION  OF  RIGHT ARM ARTERIOVENOUS GORE-TEX GRAFT;  Surgeon: Angelia Mould, MD;  Location: [place];  Service: Vascular;  Laterality: Right;   [place] REMOVAL Right 04/27/2022   Procedure: REVISION OF RIGHT ARTERIOVENOUS GRAFT AND EXCISION OF RIGHT DEGENERATED GRAFT (AVG);  Surgeon: Angelia Mould, MD;  Location: [facility] OR;  Service: Vascular;  Laterality: Right;  BACK SURGERY     CERVICAL FUSION     CHOLECYSTECTOMY  12/02/2012   Procedure: LAPAROSCOPIC CHOLECYSTECTOMY WITH INTRAOPERATIVE CHOLANGIOGRAM;  Surgeon: Edward Jolly, MD;  Location: [place];  Service: General;  Laterality: N/A;   EYE SURGERY Bilateral    cataract surgery   EYE SURGERY Left 2019   laser   HEMATOMA EVACUATION Left 12/24/2016   Procedure: EVACUATION HEMATOMA LEFT UPPER ARM;  Surgeon: Waynetta Sandy, MD;  Location: [place];  Service: Vascular;  Laterality: Left;   I & D EXTREMITY Left 12/31/2016   Procedure: IRRIGATION AND DEBRIDEMENT EXTREMITY;  Surgeon: Angelia Mould,  MD;  Location: [place];  Service: Vascular;  Laterality: Left;   INSERTION OF DIALYSIS CATHETER Right 12/24/2016   Procedure: INSERTION OF DIALYSIS CATHETER;  Surgeon: Waynetta Sandy, MD;  Location: [place];  Service: Vascular;  Laterality: Right;   INSERTION OF DIALYSIS CATHETER Right 02/04/2017   Procedure: INSERTION OF DIALYSIS CATHETER;  Surgeon: Waynetta Sandy, MD;  Location: [place];  Service: Vascular;  Laterality: Right;   [place] Left 10/23/2016   Procedure: Fistulagram;  Surgeon: Elam Dutch, MD;  Location: [place] [facility];  Service: Cardiovascular;  Laterality: Left;   PERIPHERAL VASCULAR INTERVENTION  03/26/2022   Procedure: PERIPHERAL VASCULAR INTERVENTION;  Surgeon: Marty Heck, MD;  Location: [place] [facility];  Service: Cardiovascular;;  left sfa   PERIPHERAL VASCULAR THROMBECTOMY Right 04/16/2020   Procedure: PERIPHERAL VASCULAR THROMBECTOMY;  Surgeon: Katha Cabal, MD;  Location: [place] [facility];  Service: Cardiovascular;  Laterality: Right;   RESECTION OF ARTERIOVENOUS FISTULA ANEURYSM Left 11/22/2015   Procedure: RESECTION OF LEFT RADIOCEPHALIC FISTULA ANEURYSM ;  Surgeon: Serafina Mitchell, MD;  Location: [place] OR;  Service: Vascular;  Laterality: Left;   REVISON OF ARTERIOVENOUS FISTULA Left 12/22/2016   Procedure: REVISON OF LEFT ARTERIOVENOUS FISTULA;  Surgeon: Waynetta Sandy, MD;  Location: [place];  Service: Vascular;  Laterality: Left;   REVISON OF ARTERIOVENOUS FISTULA Left 02/04/2017   Procedure: REVISON OF LEFT UPPER ARM ARTERIOVENOUS FISTULA;  Surgeon: Waynetta Sandy, MD;  Location: [place];  Service: Vascular;  Laterality: Left;   UPPER EXTREMITY ANGIOGRAPHY Bilateral 09/19/2019   Procedure: UPPER EXTREMITY ANGIOGRAPHY;  Surgeon: Katha Cabal, MD;  Location: [place] [facility];  Service: Cardiovascular;  Laterality: Bilateral;   Family History  Family History   Problem Relation Age of Onset   Colon cancer Brother    Cancer Brother    Coronary artery disease Mother 43   Hyperlipidemia Mother    Hypertension Mother    Stroke Maternal Aunt    Esophageal cancer Neg Hx    Stomach cancer Neg Hx    Rectal cancer Neg Hx    Social History  reports that she quit smoking about 30 years ago. Her smoking use included cigarettes. She has never been exposed to tobacco smoke. She has never used smokeless tobacco. She reports that she does not drink alcohol and does not use drugs. Allergies  Allergies  Allergen Reactions   Sulfa Antibiotics Other (See Comments)    Per patient, both parents allergic-so will not take   Adhesive [Tape] Itching   Home medications Prior to Admission medications   Medication Sig Start Date End Date Taking? Authorizing Provider  acetaminophen (TYLENOL) 500 MG tablet Take 1 tablet (500 mg total) by mouth every 6 (six) hours as needed. Patient taking differently: Take 500 mg  by mouth every 6 (six) hours as needed (pain). 09/05/18   Law, Bea Graff, PA-C  albuterol (PROVENTIL) (2.5 MG/3ML) 0.083% nebulizer solution Take 3 mLs (2.5 mg total) by nebulization every 4 (four) hours as needed for wheezing. Patient taking differently: Take 2.5 mg by nebulization every 6 (six) hours as needed for wheezing. 05/04/22   Elgergawy, Silver Huguenin, MD  albuterol (VENTOLIN HFA) 108 (90 Base) MCG/ACT inhaler Inhale 1-2 puffs into the lungs every 6 (six) hours as needed for wheezing or shortness of breath. Patient taking differently: Inhale 2 puffs into the lungs every 6 (six) hours as needed for shortness of breath. 01/11/22   Mesner, Corene Cornea, MD  atorvastatin (LIPITOR) 80 MG tablet Take 80 mg by mouth at bedtime. 03/25/22   [provider]  B Complex-C-Folic Acid (DIALYVITE 798) 0.8 MG WAFR Take 1 tablet by mouth every morning. 01/28/22   [provider]  clopidogrel (PLAVIX) 75 MG tablet Take 1 tablet (75 mg total) by mouth daily. Patient  taking differently: Take 75 mg by mouth at bedtime. 03/26/22 03/26/23  Marty Heck, MD  doxycycline (VIBRA-TABS) 100 MG tablet Take 1 tablet (100 mg total) by mouth 2 (two) times daily. 07/02/22   Laurice Record, MD  escitalopram (LEXAPRO) 10 MG tablet Take 10 mg by mouth every morning. 03/25/22   [provider]  hydrOXYzine (ATARAX) 25 MG tablet Take 1 tablet (25 mg total) by mouth every 8 (eight) hours as needed for itching. 05/21/22   Mercy Riding, MD  midodrine (PROAMATINE) 10 MG tablet Take 1 tablet (10 mg total) by mouth 3 (three) times daily with meals. 05/21/22   Mercy Riding, MD  Nutritional Supplements (NEPRO) LIQD Take 237 mLs by mouth 2 (two) times daily between meals.    [provider]  ondansetron (ZOFRAN) 8 MG tablet Take 8 mg by mouth every 12 (twelve) hours as needed for nausea or vomiting.    [provider]  OVER THE COUNTER MEDICATION Take 1 tablet by mouth every morning. Folic acid - vitamin B complex - vitamin C - selenium - zinc 3 mg    [provider]  pantoprazole (PROTONIX) 40 MG tablet Take 1 tablet (40 mg total) by mouth daily. 08/18/21   Nita Sells, MD  sevelamer (RENAGEL) 800 MG tablet Take 1,600 mg by mouth 3 (three) times daily with meals. 03/24/22   [provider]  zolpidem (AMBIEN) 5 MG tablet Take 1 tablet (5 mg total) by mouth at bedtime as needed for up to 10 days for sleep (Insomnia). 05/21/22 05/31/22  Wendee Beavers T, MD     Vitals:   08/18/22 9211 08/18/22 0810 08/18/22 0845 08/18/22 0936  BP: (!) 116/56 (!) 122/45 100/75 91/72  Pulse: 79 74  75  Resp: '16 17 16 19  '$ Temp: 97.6 F (36.4 C)   97.8 F (36.6 C)  TempSrc: Oral   Oral  SpO2: 95% 94%  94%   Exam Gen alert, no distress, elderly AAF frail, pleasant No rash, cyanosis or gangrene Sclera anicteric, throat clear  No jvd or bruits Chest clear bilat to bases, no rales/ wheezing RRR no MRG Abd soft ntnd no mass or ascites +bs GU defer MS no  joint effusions or deformity Ext no LE or UE edema, no wounds or ulcers Neuro is alert, Ox 3 , nf    RUA AVG+bruit   Home meds include - albuterol, atorvastatin, clopidogrel, escitalopram, midodrine 10 tid, pantoprazole, sevelamer 2 ac tid, prns/  vits/ supps   OP HD: [place] MWF 4h  350/600  44.4kg   2/2 bath P2  RUA AVG     Hep none - doxercalciferol 3 ug iv tiw - cinacalcet 30 po tiw - last HD 9/1, came off at dry wt, UF ave 1-3L - last Hb 11.8, no esa - last phos 5 and pth 1030   Assessment/ Plan: SOB - CXR w/o edema, she is 3kg up by stand wt this am. Goal max UF 2.5-3 L as tol w/ HD today.  ESRD - usual HD MWF. Missed HD yesterday. Plan for HD today off schedule, will also plan short HD tomorrow to get back on schedule.  Hyperkalemia - K+ 6.0, getting temporizing measures per ED MD and will get HD as well today.  H/o CVA - on plavix Chronic hypotension - takes midodrine at home Vol - 3kg up today w/ stand wt, UFG the same Anemia esrd - Hb 11, no esa needs MBD ckd - CCa in range, cont binders      Kelly Splinter  MD 08/18/2022, 9:47 AM Recent Labs  Lab 08/18/22 0634  HGB 11.5*  ALBUMIN 3.2*  CALCIUM 8.9  CREATININE 7.72*  K 6.0*

## 2022-08-19 DIAGNOSIS — D631 Anemia in chronic kidney disease: Secondary | ICD-10-CM | POA: Diagnosis not present

## 2022-08-19 DIAGNOSIS — Z992 Dependence on renal dialysis: Secondary | ICD-10-CM | POA: Diagnosis not present

## 2022-08-19 DIAGNOSIS — N186 End stage renal disease: Secondary | ICD-10-CM | POA: Diagnosis not present

## 2022-08-19 DIAGNOSIS — N2581 Secondary hyperparathyroidism of renal origin: Secondary | ICD-10-CM | POA: Diagnosis not present

## 2022-08-19 DIAGNOSIS — L299 Pruritus, unspecified: Secondary | ICD-10-CM | POA: Diagnosis not present

## 2022-08-19 DIAGNOSIS — R197 Diarrhea, unspecified: Secondary | ICD-10-CM | POA: Diagnosis not present

## 2022-08-19 LAB — COMPREHENSIVE METABOLIC PANEL
ALT: 34 U/L (ref 0–44)
AST: 65 U/L — ABNORMAL HIGH (ref 15–41)
Albumin: 3.2 g/dL — ABNORMAL LOW (ref 3.5–5.0)
Alkaline Phosphatase: 220 U/L — ABNORMAL HIGH (ref 38–126)
Anion gap: 26 — ABNORMAL HIGH (ref 5–15)
BUN: 81 mg/dL — ABNORMAL HIGH (ref 8–23)
CO2: 13 mmol/L — ABNORMAL LOW (ref 22–32)
Calcium: 8.9 mg/dL (ref 8.9–10.3)
Chloride: 97 mmol/L — ABNORMAL LOW (ref 98–111)
Creatinine, Ser: 7.72 mg/dL — ABNORMAL HIGH (ref 0.44–1.00)
GFR, Estimated: 5 mL/min — ABNORMAL LOW (ref >60)
Glucose, Bld: 103 mg/dL — ABNORMAL HIGH (ref 70–99)
Potassium: 6 mmol/L — ABNORMAL HIGH (ref 3.5–5.1)
Sodium: 136 mmol/L (ref 135–145)
Total Bilirubin: 1.7 mg/dL — ABNORMAL HIGH (ref 0.3–1.2)
Total Protein: 6.8 g/dL (ref 6.5–8.1)

## 2022-08-19 LAB — HEPATITIS B SURFACE ANTIBODY, QUANTITATIVE: Hep B S AB Quant (Post): >1000 m[IU]/mL (ref >9.9)

## 2022-08-19 IMAGING — DX DG FOOT COMPLETE 3+V*L*
3 series · 3 of 3 positions shown · non-contrast
Comparison: Left foot radiograph 11/27/2021.

CLINICAL DATA: 70-year-old female with history of left foot pain.
Chronic open wound anterior to the mid metatarsals.

EXAM:
LEFT FOOT - COMPLETE 3+ VIEW

[foot ap]
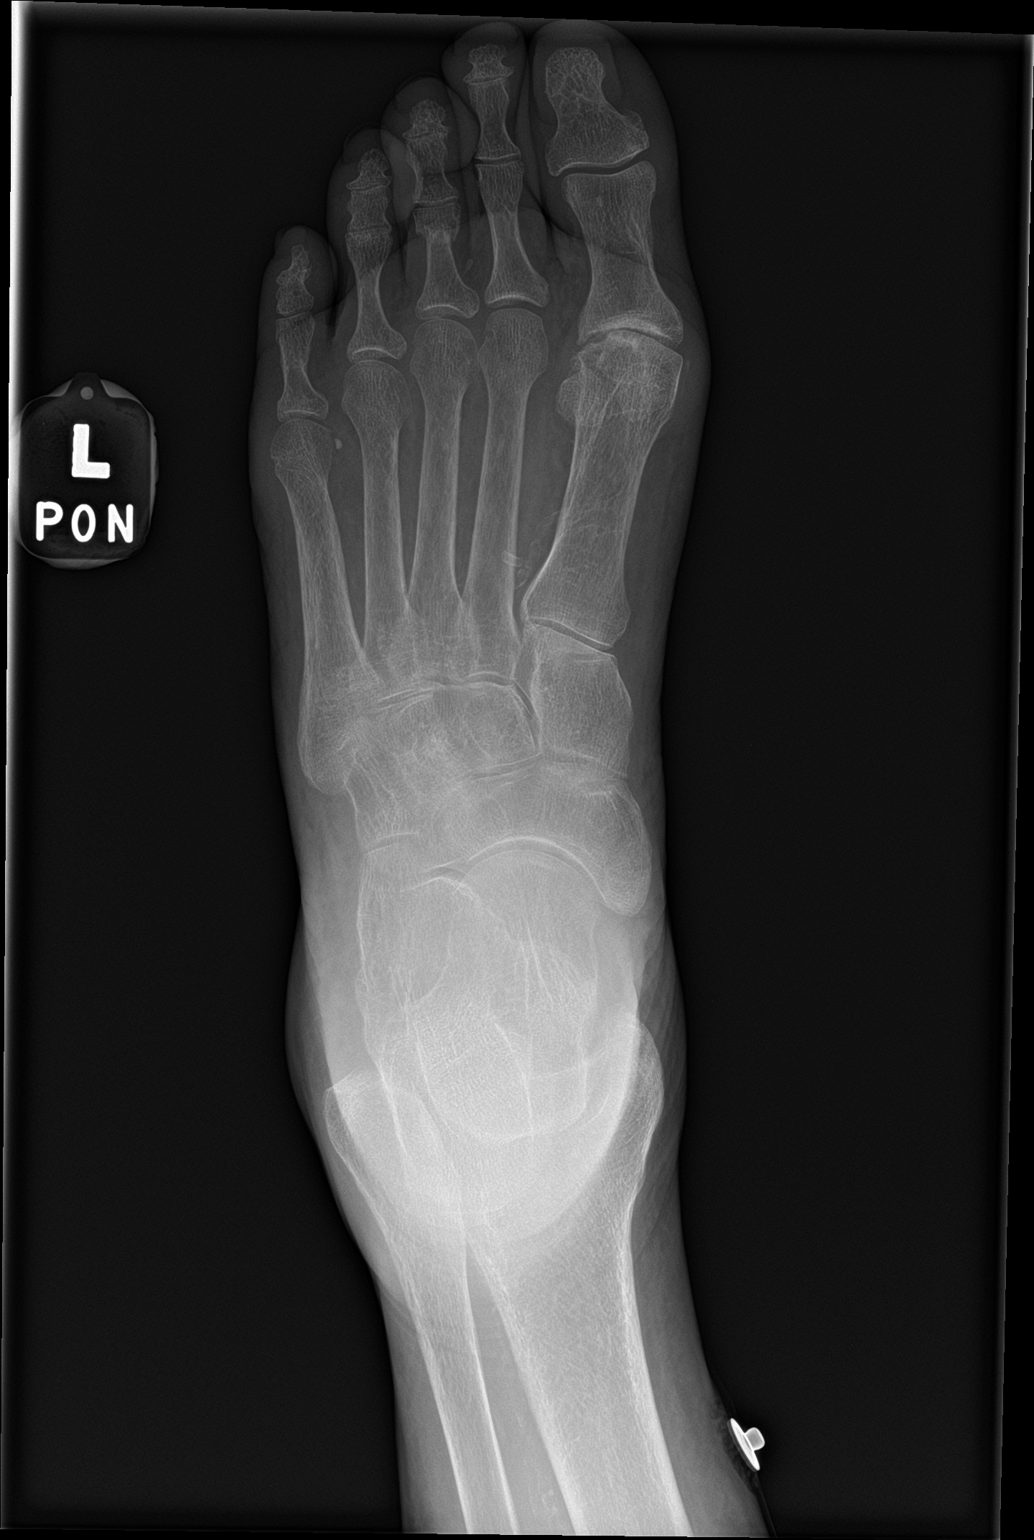

[foot obl]
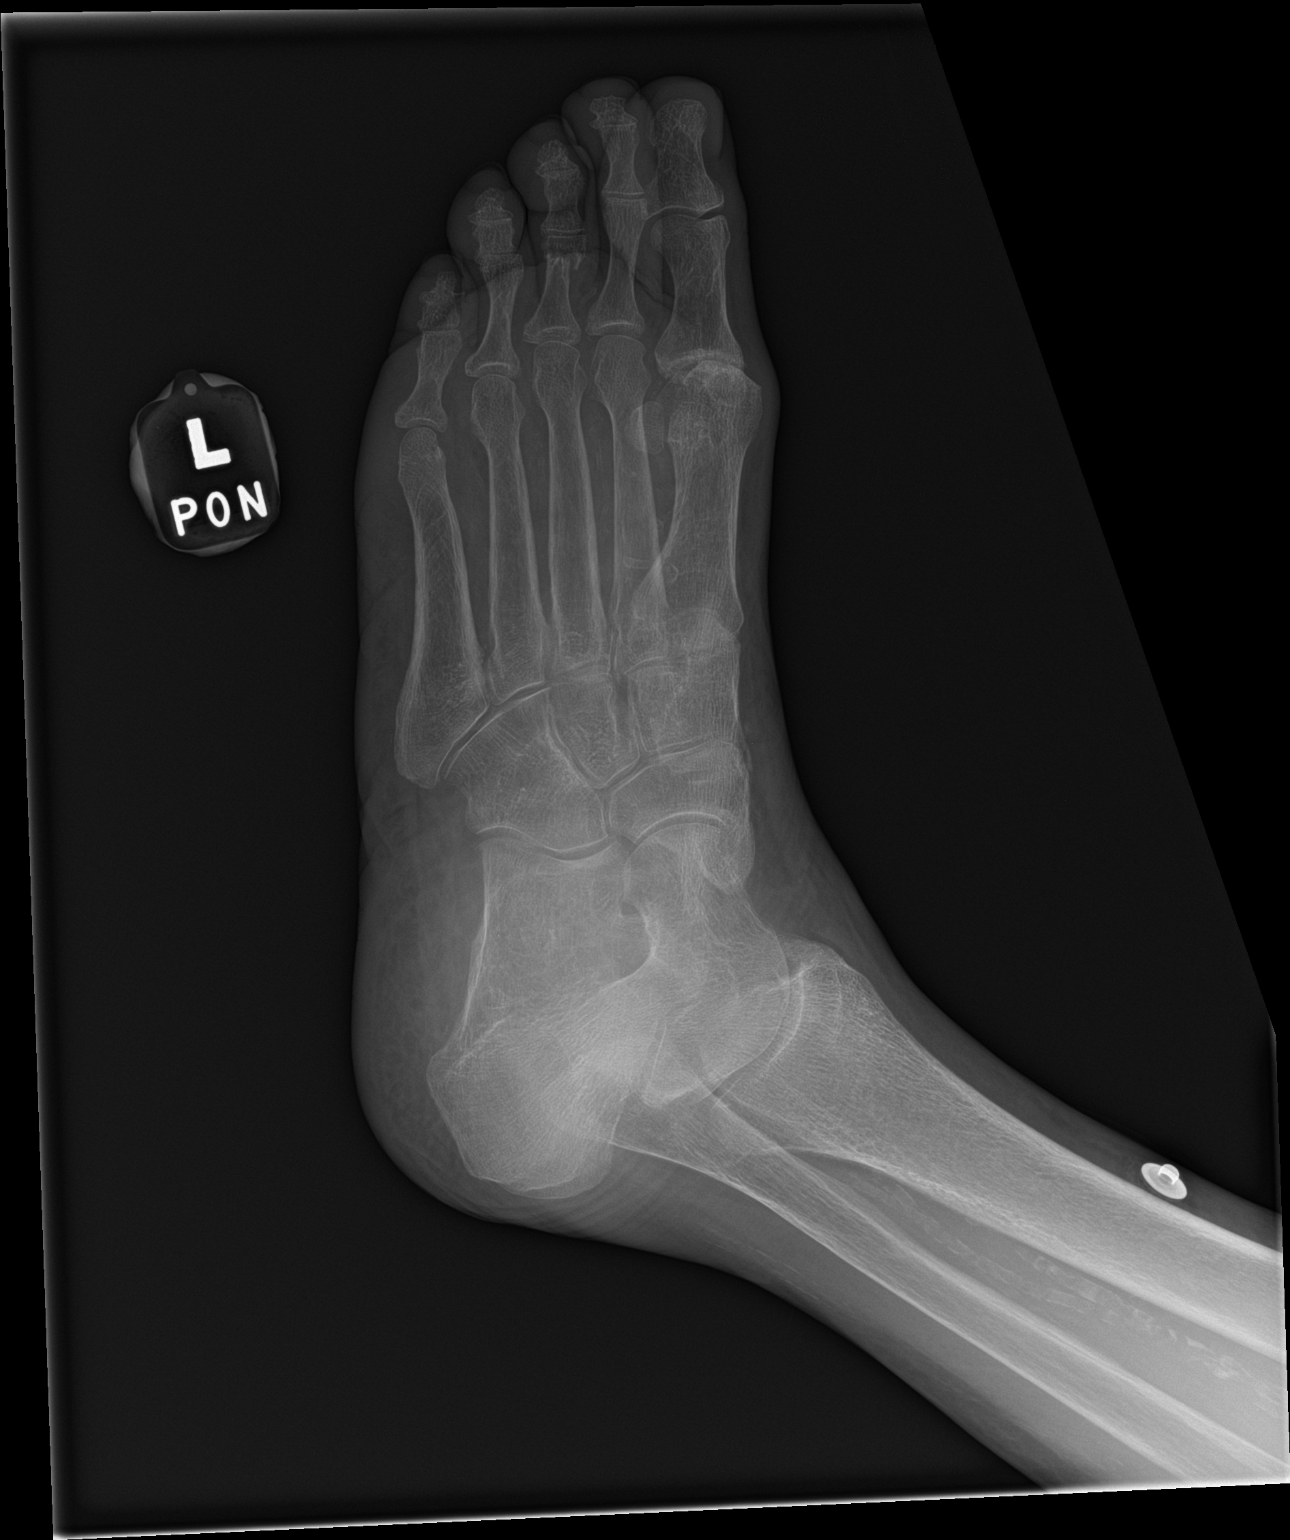

[foot lat]
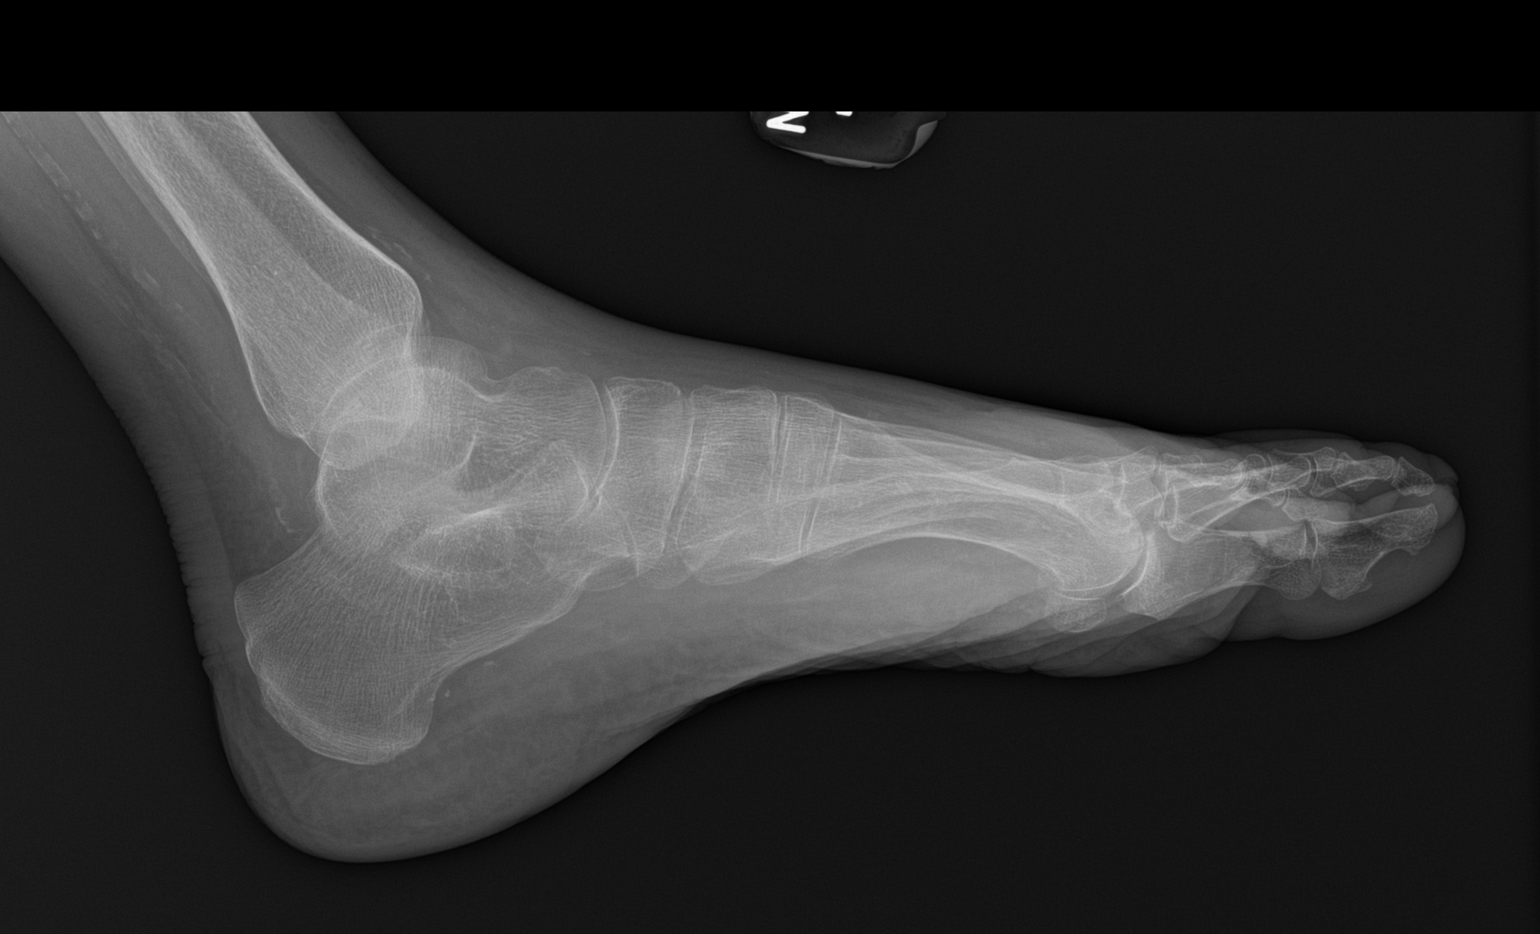

[3 of 3 positions shown; findings below may reference images not displayed]

FINDINGS: There is mild irregularity of the soft tissues of the dorsal aspect
of the foot overlying the mid and proximal metatarsals. No
destructive osseous lesions are noted in the underlying bones. Old
healing fracture of the distal third of the third proximal phalanx.
No acute displaced fracture, subluxation or dislocation is noted.
Severe joint space narrowing, subchondral sclerosis, subchondral
cyst formation and osteophyte formation are noted in the first MTP
joint.
IMPRESSION: 1. No destructive osseous lesions to suggest osteomyelitis.
2. Chronic degenerative and posttraumatic changes, as above. No new
acute osseous findings are noted.

## 2022-08-20 IMAGING — DX DG CHEST 1V PORT
1 series · 2 of 2 positions shown · non-contrast
Comparison: Chest PA Lat recently 01/09/2022.

CLINICAL DATA: Shortness of breath.

EXAM:
PORTABLE CHEST 1 VIEW

[Series 1: chest · 0.14mm/px · 2 of 2 slices shown]
[im 1/2]
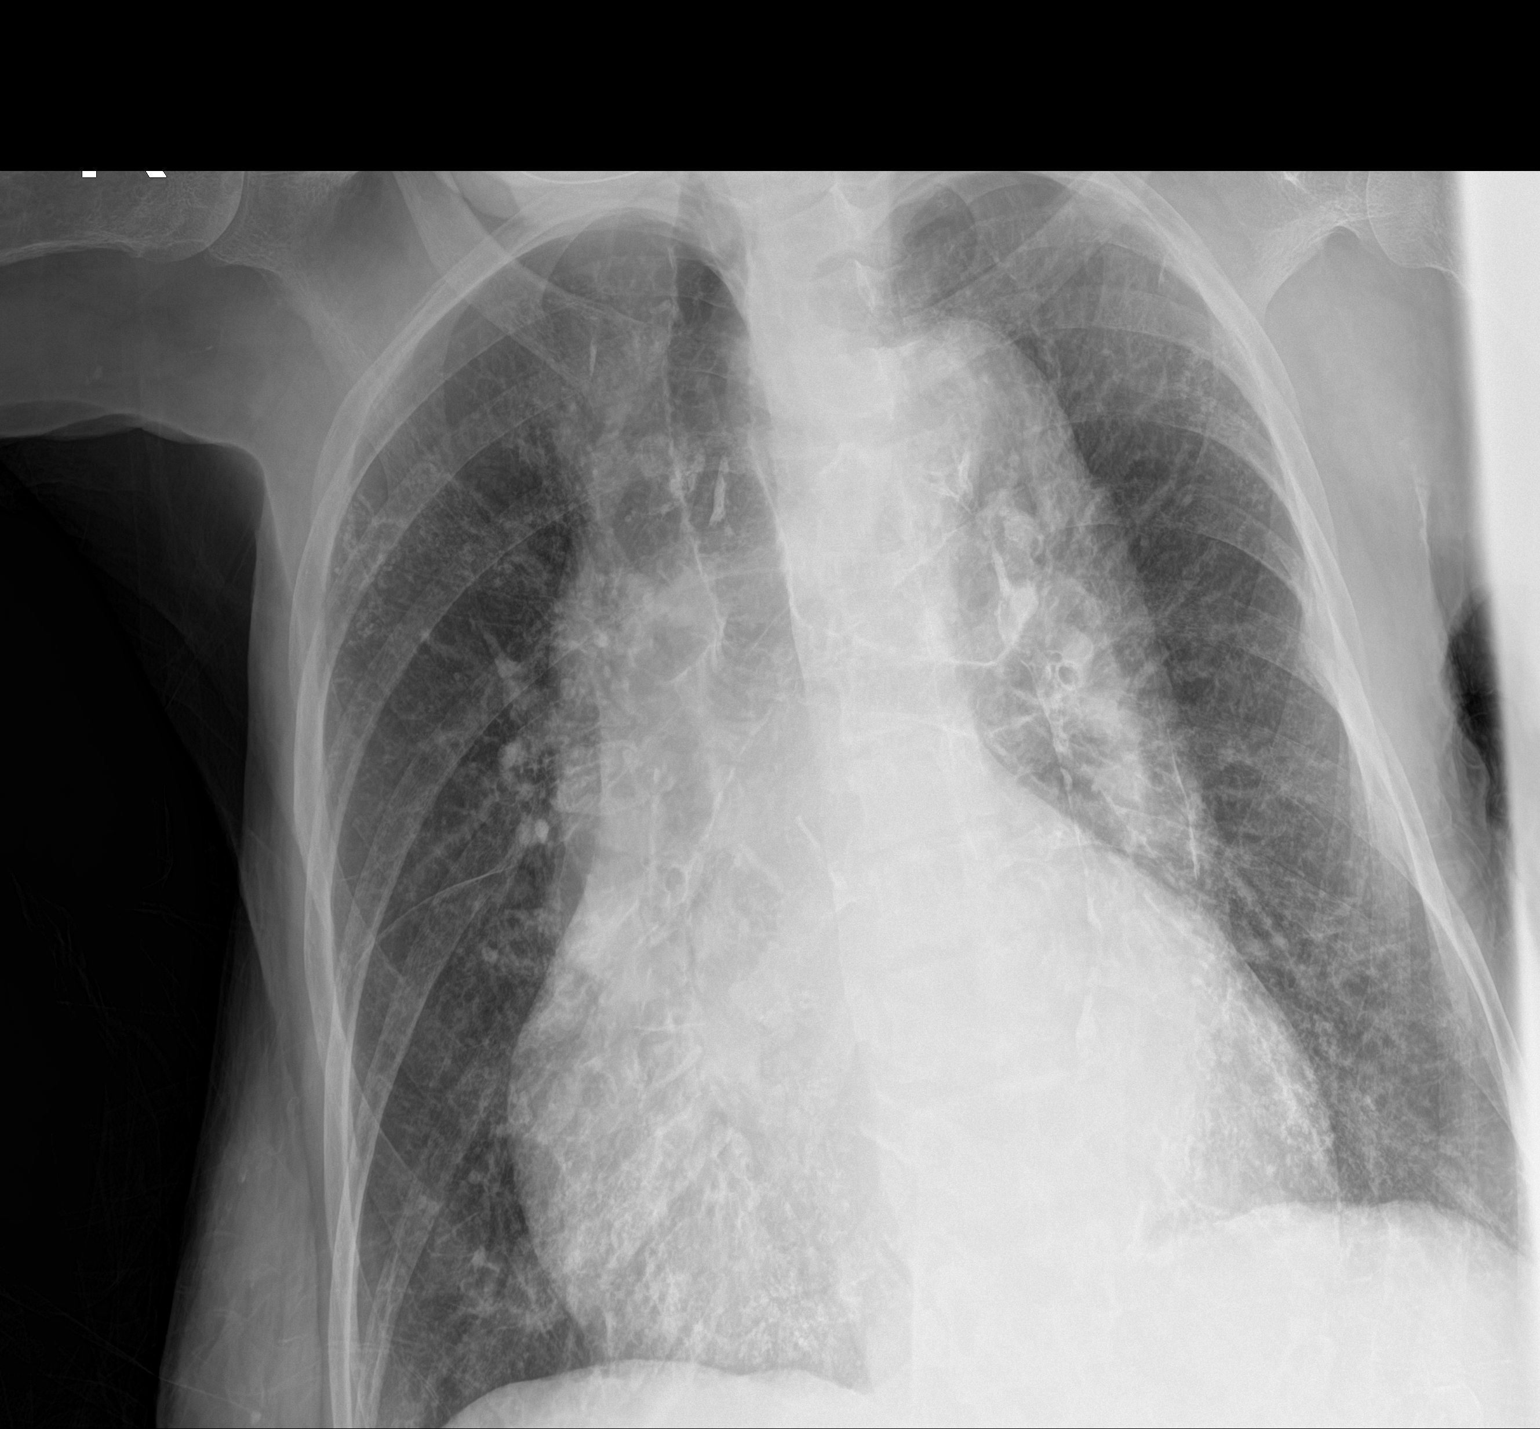
[im 2/2]
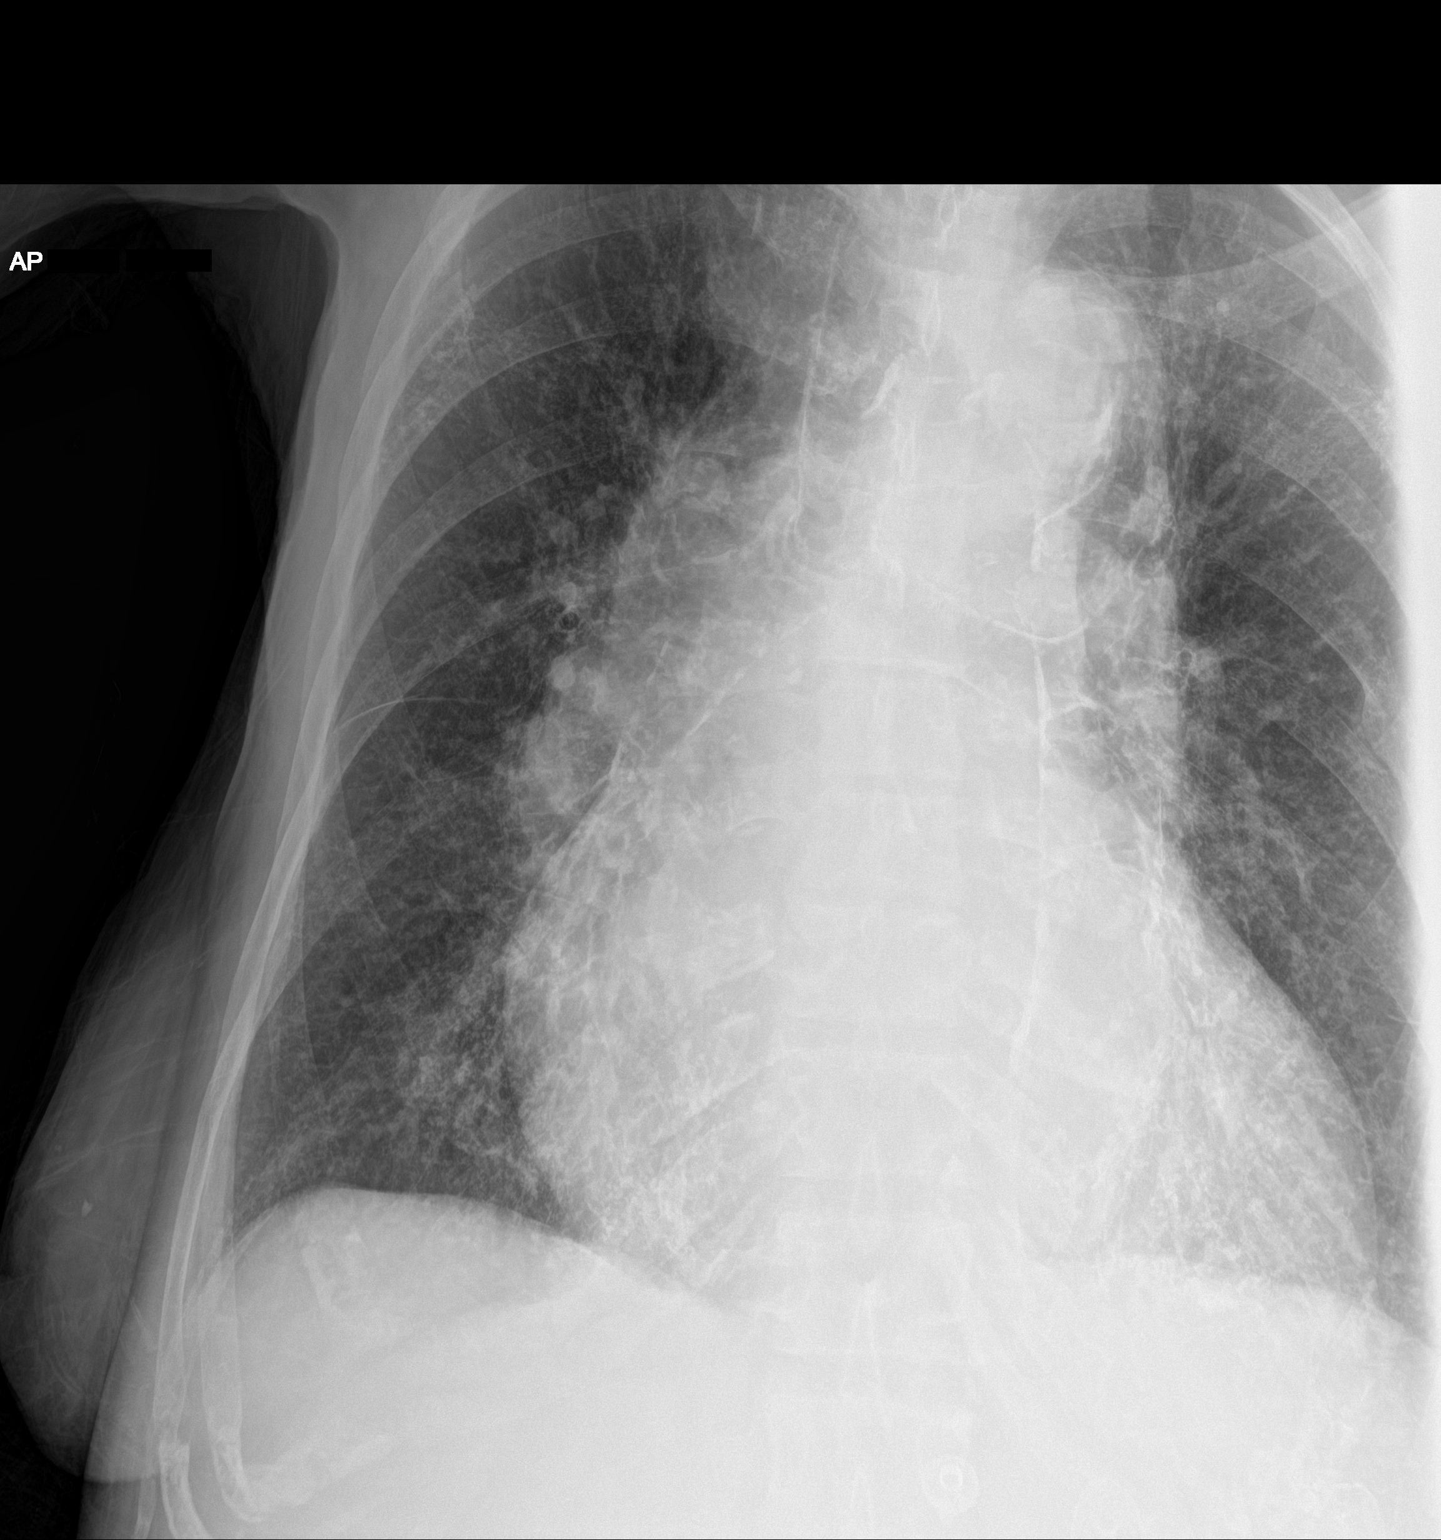

[2 of 2 positions shown; findings below may reference images not displayed]

FINDINGS: The heart is moderately enlarged. The aorta is tortuous and
calcified with aortic ectasia, stable mediastinum allowing for
patient rotation to the right.

There are chronically prominent perihilar vessels. No acute edema is
seen. No pleural effusion is evident. There are chronic interstitial
changes in the upper lung fields, chronic pulmonary emphysema
without focal infiltrate. Osteopenia.
IMPRESSION: Cardiomegaly with chronic perihilar vascular congestion. No acute
edema findings. COPD and chronic change. Aortic atherosclerosis.
Stable chest.

## 2022-08-21 DIAGNOSIS — Z992 Dependence on renal dialysis: Secondary | ICD-10-CM | POA: Diagnosis not present

## 2022-08-21 DIAGNOSIS — N186 End stage renal disease: Secondary | ICD-10-CM | POA: Diagnosis not present

## 2022-08-21 DIAGNOSIS — L299 Pruritus, unspecified: Secondary | ICD-10-CM | POA: Diagnosis not present

## 2022-08-21 DIAGNOSIS — D631 Anemia in chronic kidney disease: Secondary | ICD-10-CM | POA: Diagnosis not present

## 2022-08-21 DIAGNOSIS — R197 Diarrhea, unspecified: Secondary | ICD-10-CM | POA: Diagnosis not present

## 2022-08-21 DIAGNOSIS — N2581 Secondary hyperparathyroidism of renal origin: Secondary | ICD-10-CM | POA: Diagnosis not present

## 2022-08-24 DIAGNOSIS — N186 End stage renal disease: Secondary | ICD-10-CM | POA: Diagnosis not present

## 2022-08-24 DIAGNOSIS — N2581 Secondary hyperparathyroidism of renal origin: Secondary | ICD-10-CM | POA: Diagnosis not present

## 2022-08-24 DIAGNOSIS — Z992 Dependence on renal dialysis: Secondary | ICD-10-CM | POA: Diagnosis not present

## 2022-08-24 DIAGNOSIS — D631 Anemia in chronic kidney disease: Secondary | ICD-10-CM | POA: Diagnosis not present

## 2022-08-24 DIAGNOSIS — R197 Diarrhea, unspecified: Secondary | ICD-10-CM | POA: Diagnosis not present

## 2022-08-24 DIAGNOSIS — L299 Pruritus, unspecified: Secondary | ICD-10-CM | POA: Diagnosis not present

## 2022-08-26 DIAGNOSIS — N2581 Secondary hyperparathyroidism of renal origin: Secondary | ICD-10-CM | POA: Diagnosis not present

## 2022-08-26 DIAGNOSIS — Z992 Dependence on renal dialysis: Secondary | ICD-10-CM | POA: Diagnosis not present

## 2022-08-26 DIAGNOSIS — D631 Anemia in chronic kidney disease: Secondary | ICD-10-CM | POA: Diagnosis not present

## 2022-08-26 DIAGNOSIS — R197 Diarrhea, unspecified: Secondary | ICD-10-CM | POA: Diagnosis not present

## 2022-08-26 DIAGNOSIS — L299 Pruritus, unspecified: Secondary | ICD-10-CM | POA: Diagnosis not present

## 2022-08-26 DIAGNOSIS — N186 End stage renal disease: Secondary | ICD-10-CM | POA: Diagnosis not present

## 2022-08-28 DIAGNOSIS — N186 End stage renal disease: Secondary | ICD-10-CM | POA: Diagnosis not present

## 2022-08-28 DIAGNOSIS — R197 Diarrhea, unspecified: Secondary | ICD-10-CM | POA: Diagnosis not present

## 2022-08-28 DIAGNOSIS — N2581 Secondary hyperparathyroidism of renal origin: Secondary | ICD-10-CM | POA: Diagnosis not present

## 2022-08-28 DIAGNOSIS — D631 Anemia in chronic kidney disease: Secondary | ICD-10-CM | POA: Diagnosis not present

## 2022-08-28 DIAGNOSIS — Z992 Dependence on renal dialysis: Secondary | ICD-10-CM | POA: Diagnosis not present

## 2022-08-28 DIAGNOSIS — L299 Pruritus, unspecified: Secondary | ICD-10-CM | POA: Diagnosis not present

## 2022-08-30 IMAGING — DX DG CHEST 2V
2 series · 2 of 2 positions shown · non-contrast
Comparison: 01/11/2022

CLINICAL DATA: Shortness of breath

EXAM:
CHEST - 2 VIEW

[chest pa]
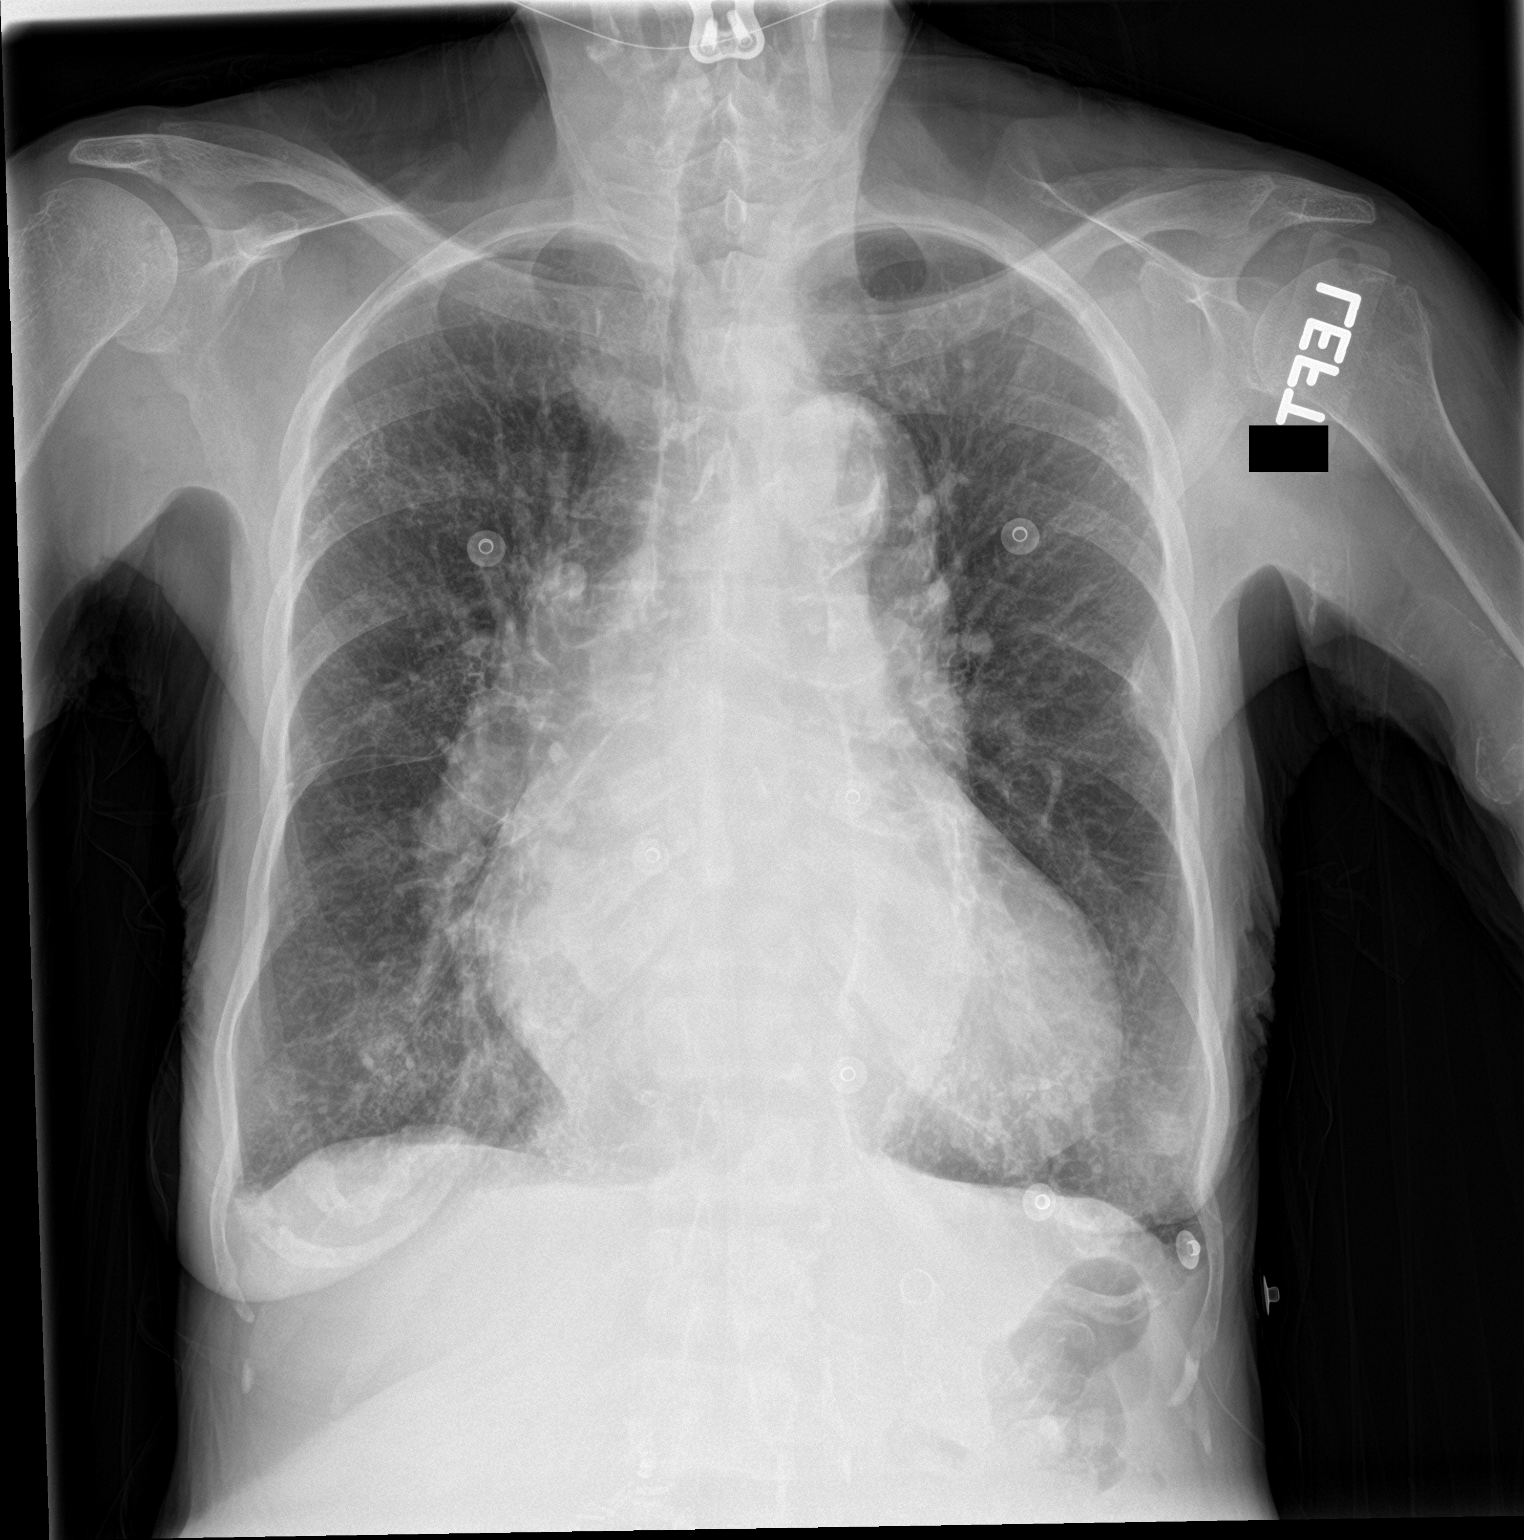

[chest lat]
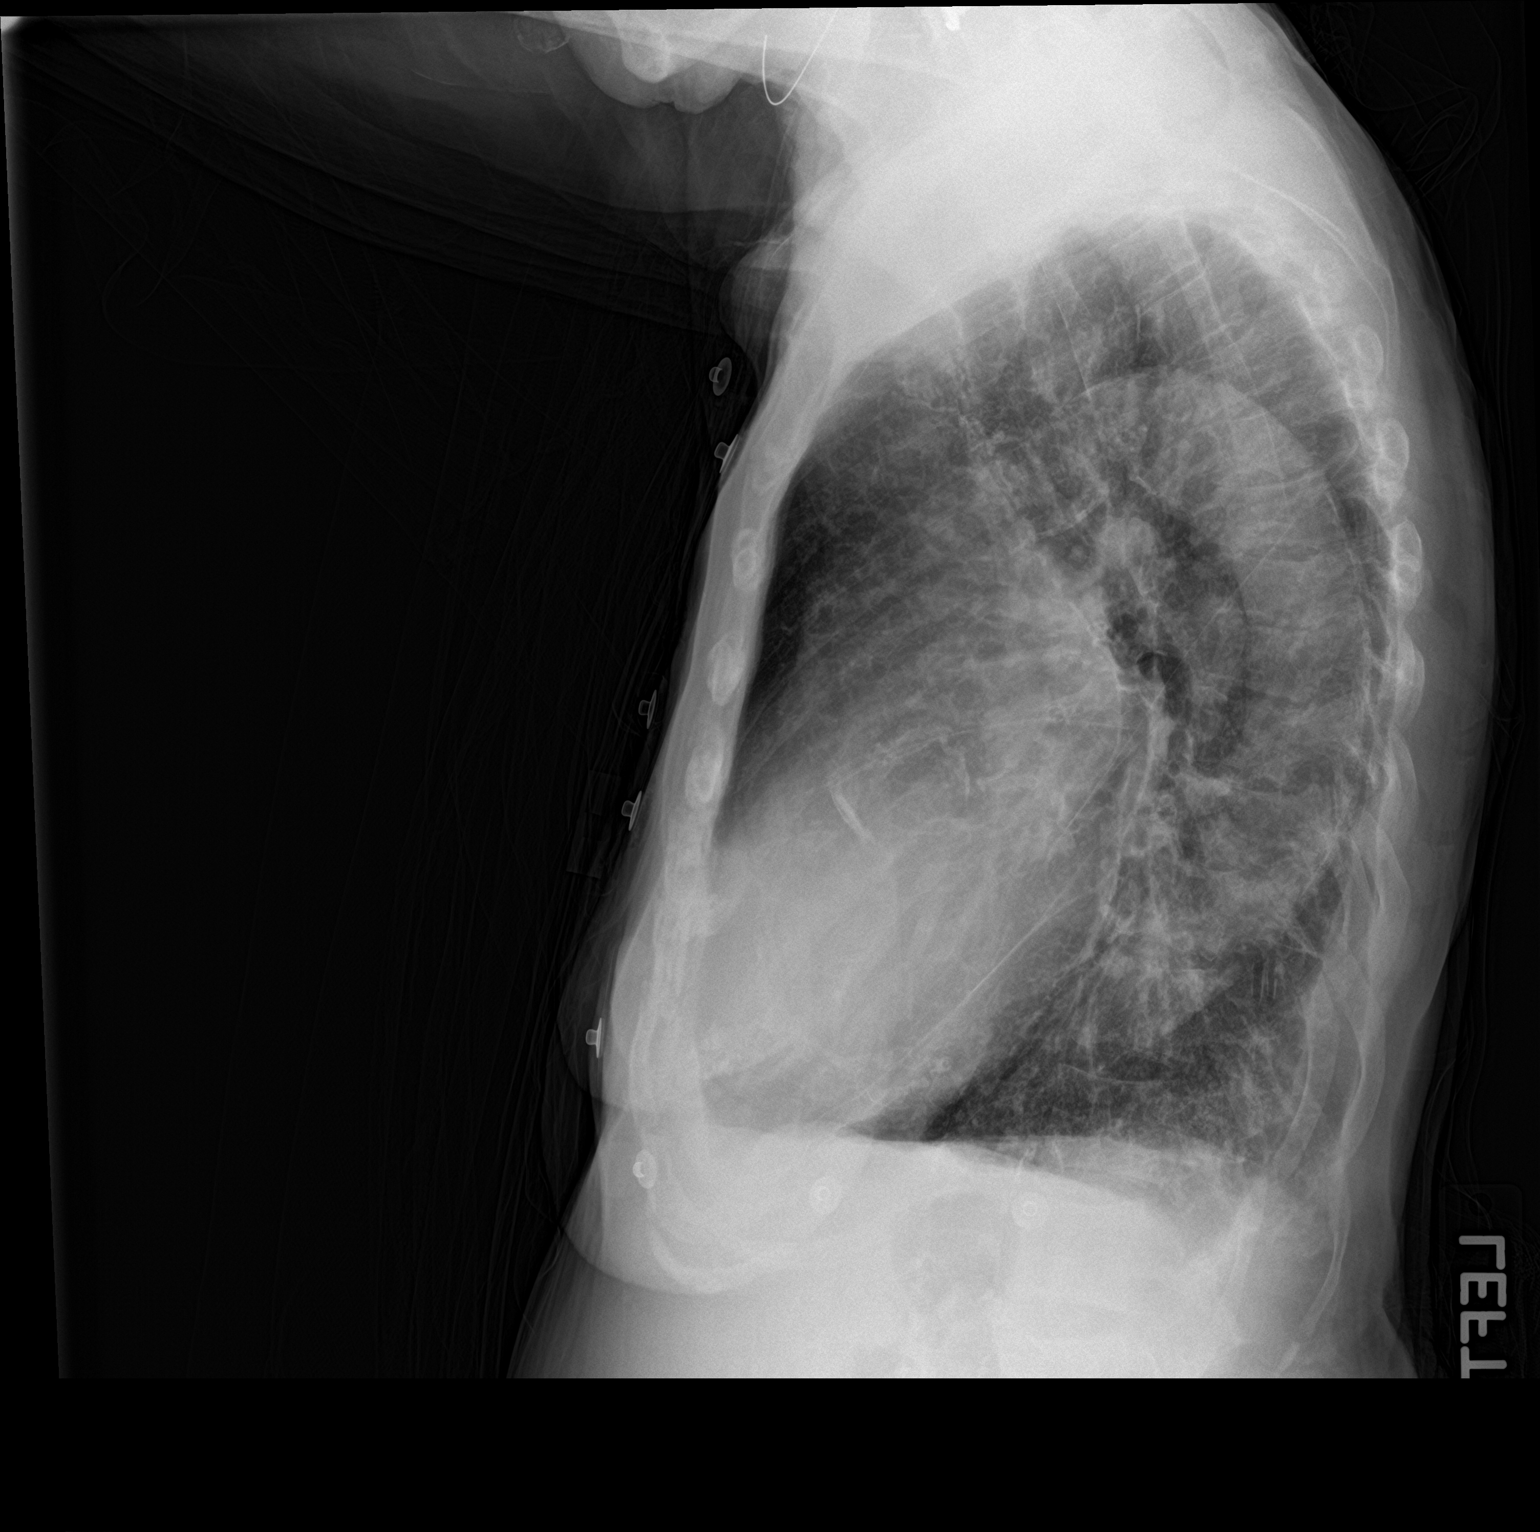

[2 of 2 positions shown; findings below may reference images not displayed]

FINDINGS: Cardiomegaly. Thickened central vessels with cephalized flow. Patchy
reticular opacity correlating with scattered microlithiasis and
scar-like appearance on 4545 CT. Large lung volumes with diaphragm
flattening. There is no edema, consolidation, effusion, or
pneumothorax.
IMPRESSION: 1. Chronic cardiomegaly and vascular congestion.
2. Pulmonary scarring.

## 2022-08-31 DIAGNOSIS — D631 Anemia in chronic kidney disease: Secondary | ICD-10-CM | POA: Diagnosis not present

## 2022-08-31 DIAGNOSIS — R197 Diarrhea, unspecified: Secondary | ICD-10-CM | POA: Diagnosis not present

## 2022-08-31 DIAGNOSIS — N186 End stage renal disease: Secondary | ICD-10-CM | POA: Diagnosis not present

## 2022-08-31 DIAGNOSIS — N2581 Secondary hyperparathyroidism of renal origin: Secondary | ICD-10-CM | POA: Diagnosis not present

## 2022-08-31 DIAGNOSIS — Z992 Dependence on renal dialysis: Secondary | ICD-10-CM | POA: Diagnosis not present

## 2022-08-31 DIAGNOSIS — L299 Pruritus, unspecified: Secondary | ICD-10-CM | POA: Diagnosis not present

## 2022-08-31 IMAGING — DX DG ABDOMEN ACUTE W/ 1V CHEST
4 series · 4 of 4 positions shown · non-contrast
Comparison: Radiograph dated December 19, 2012

CLINICAL DATA: Chest and abdominal pain

EXAM:
DG ABDOMEN ACUTE WITH 1 VIEW CHEST

[abdomen erect]
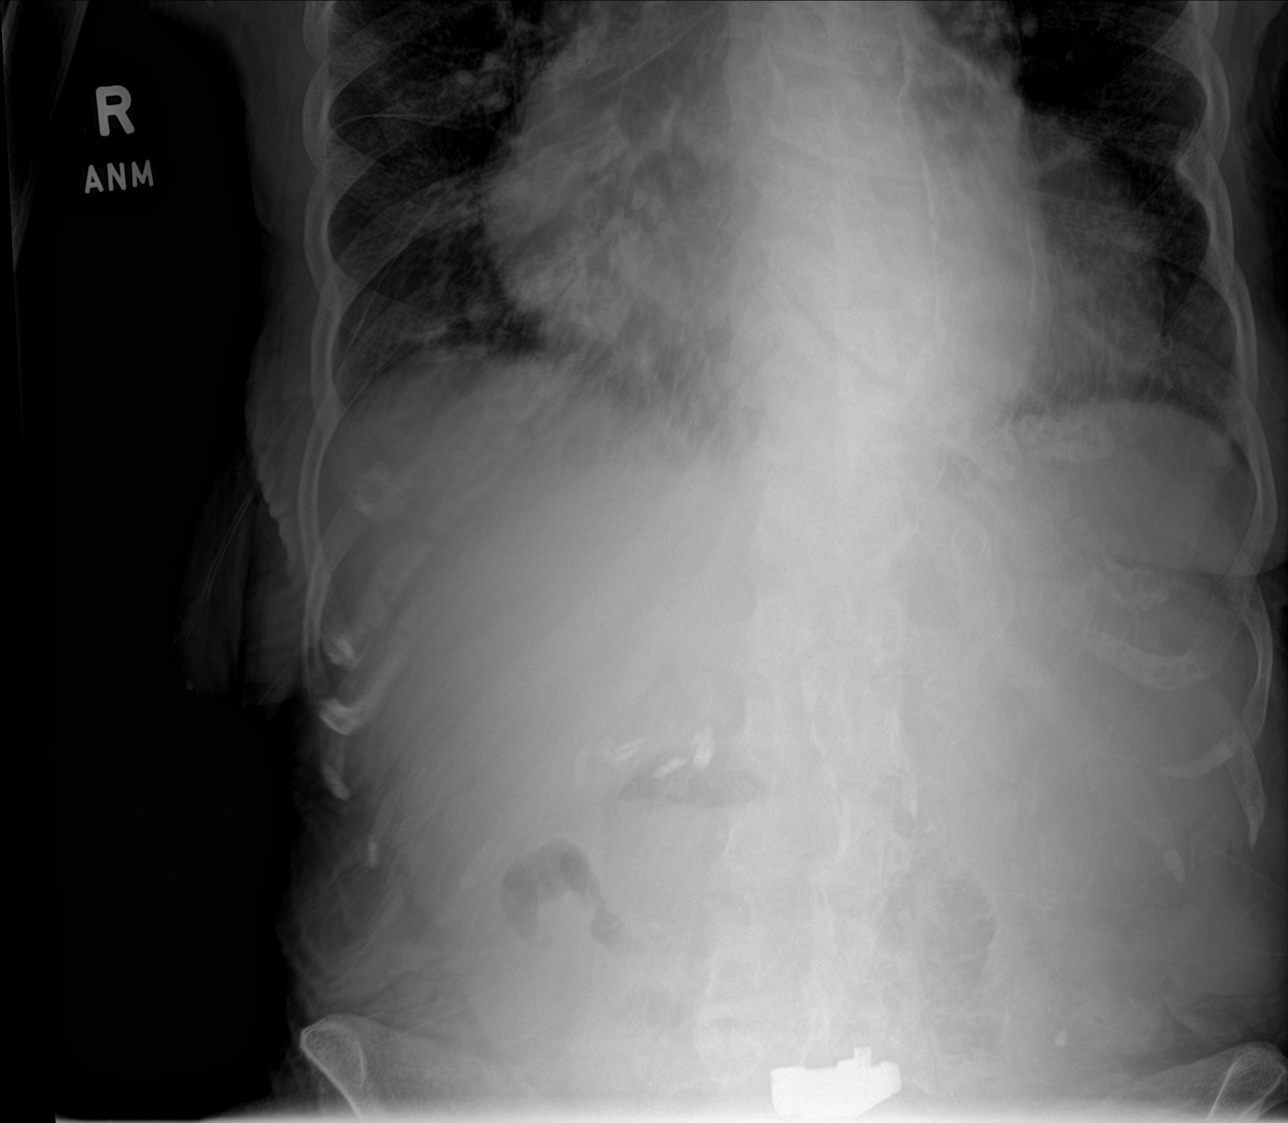

[abdomen supine (1 of 2)]
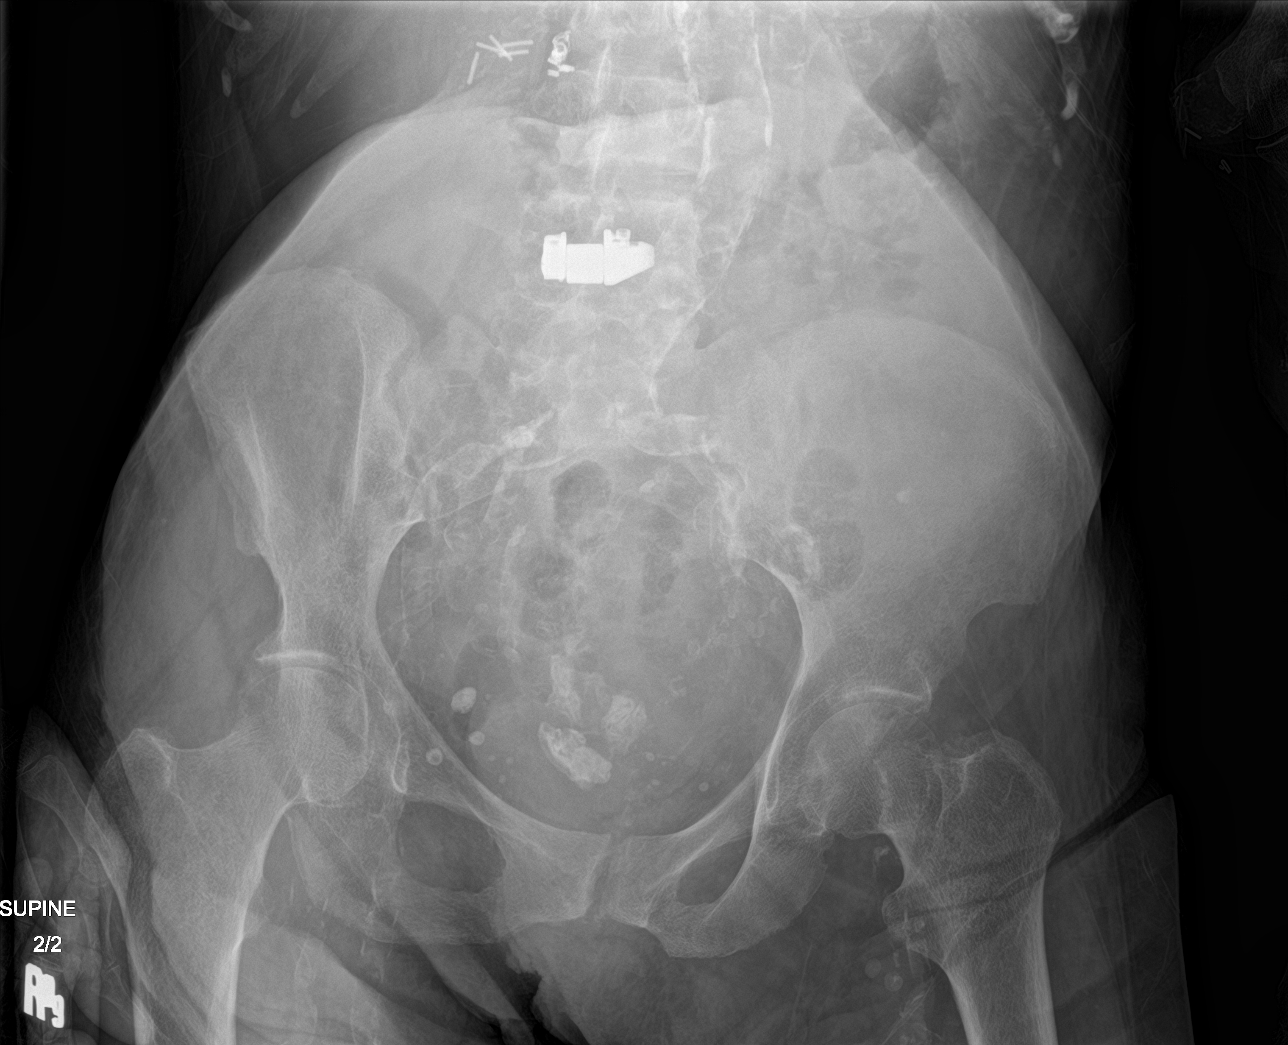

[abdomen supine (2 of 2)]
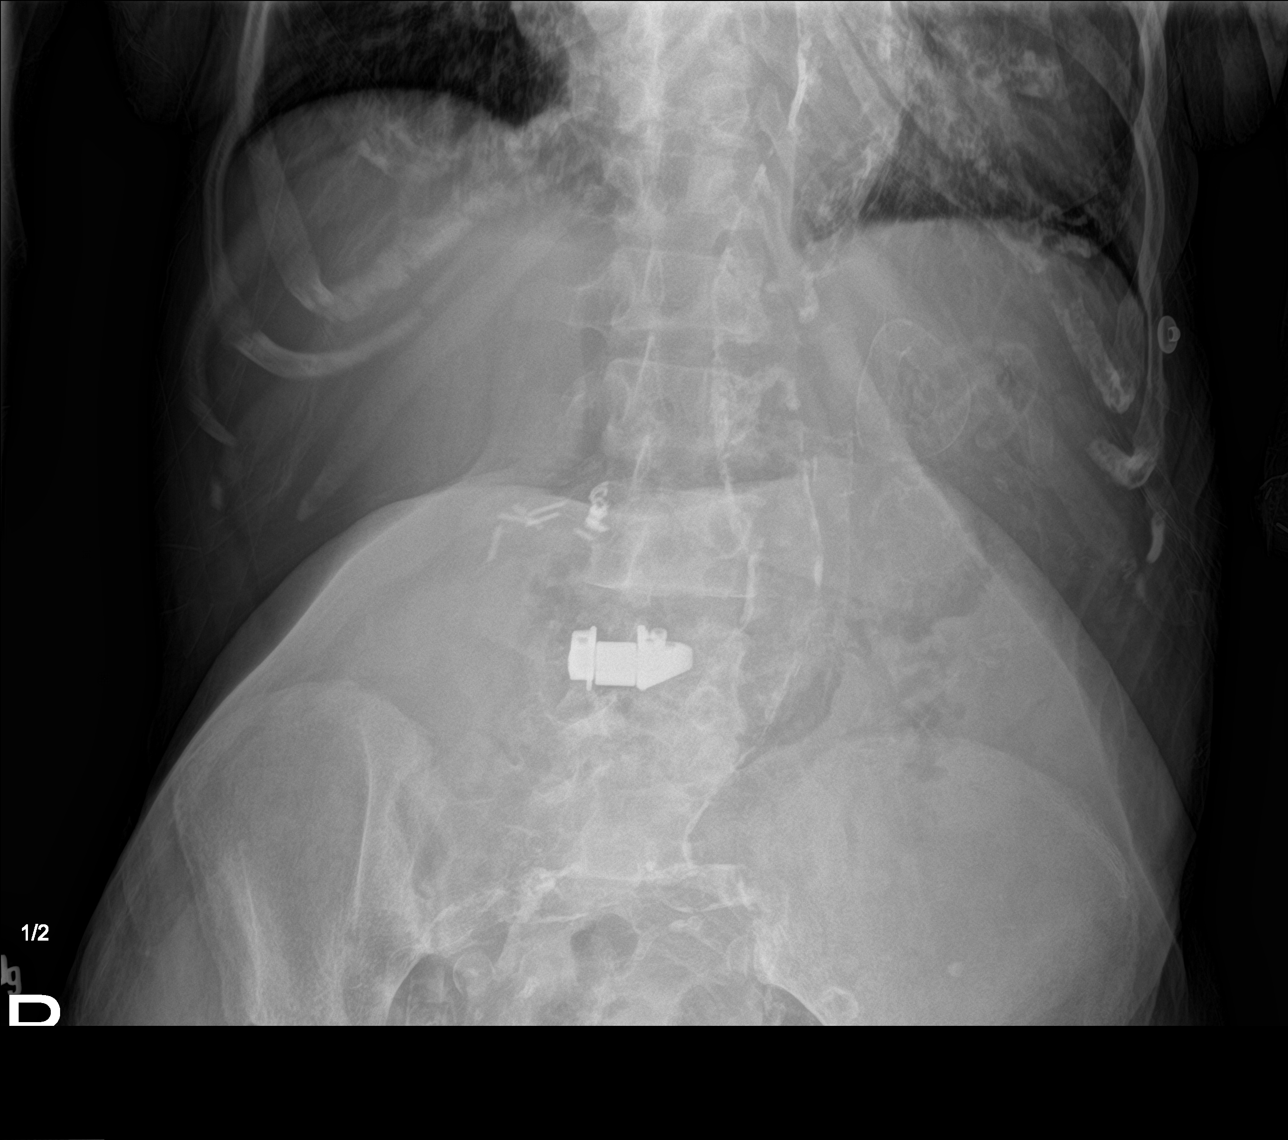

[chest ap]
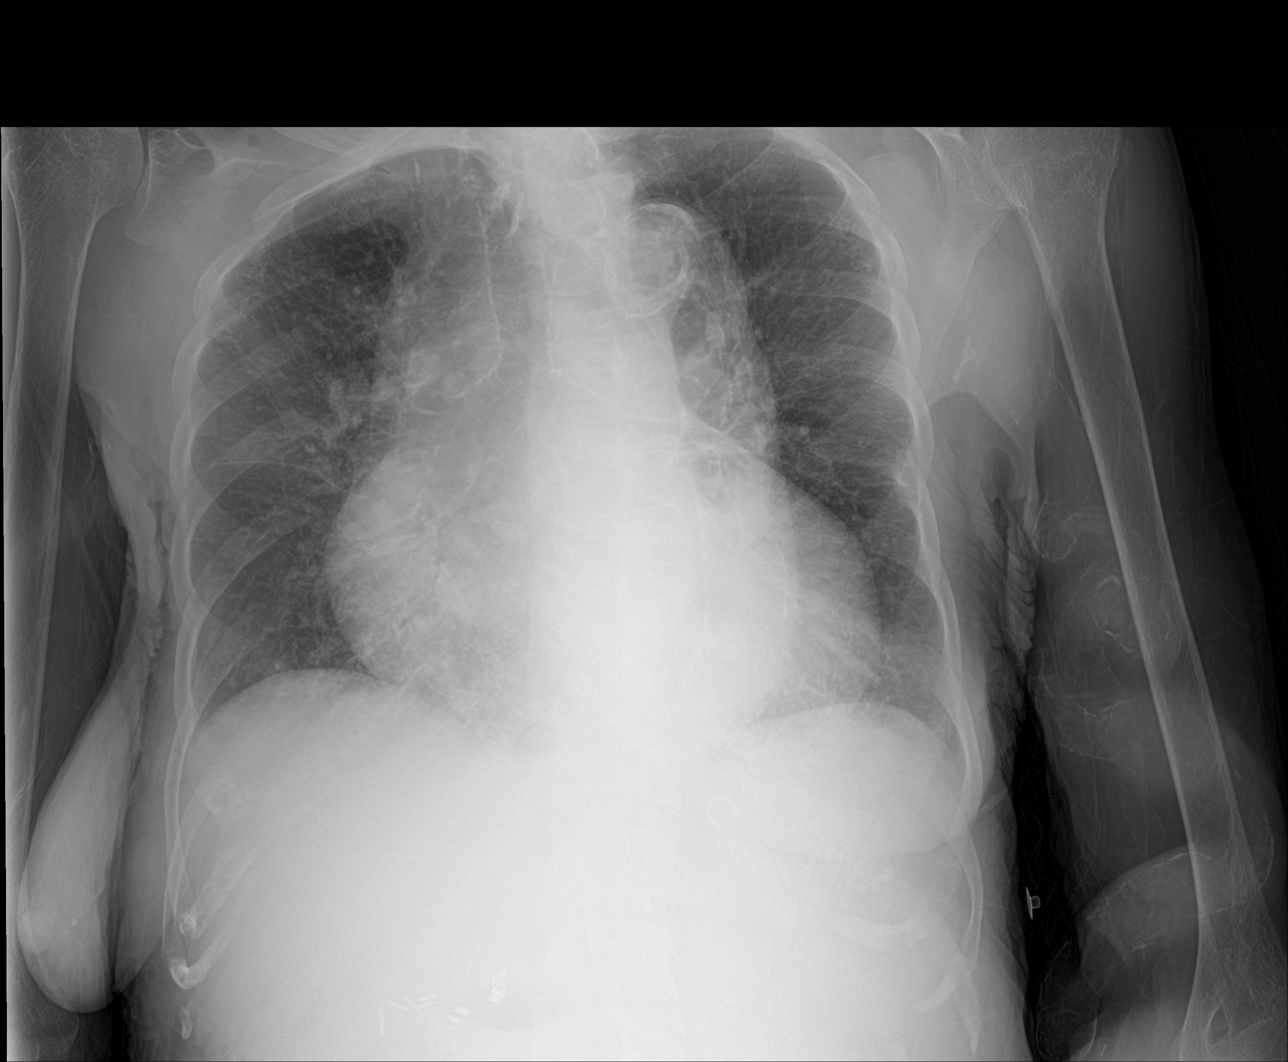

[4 of 4 positions shown; findings below may reference images not displayed]

FINDINGS: Nonobstructive bowel gas pattern. In aortoiliac vascular
calcifications. Visualized lung bases demonstrate bibasilar
atelectasis and cardiomegaly. No acute osseous abnormality.
IMPRESSION: Nonobstructive bowel gas pattern.

## 2022-08-31 IMAGING — DX DG CHEST 1V PORT
1 series · 1 of 1 positions shown · non-contrast
Comparison: Chest x-ray from yesterday.

CLINICAL DATA: Cough, shortness of breath, and weakness. Nausea and
abdominal pain.

EXAM:
PORTABLE CHEST 1 VIEW

[chest ap]
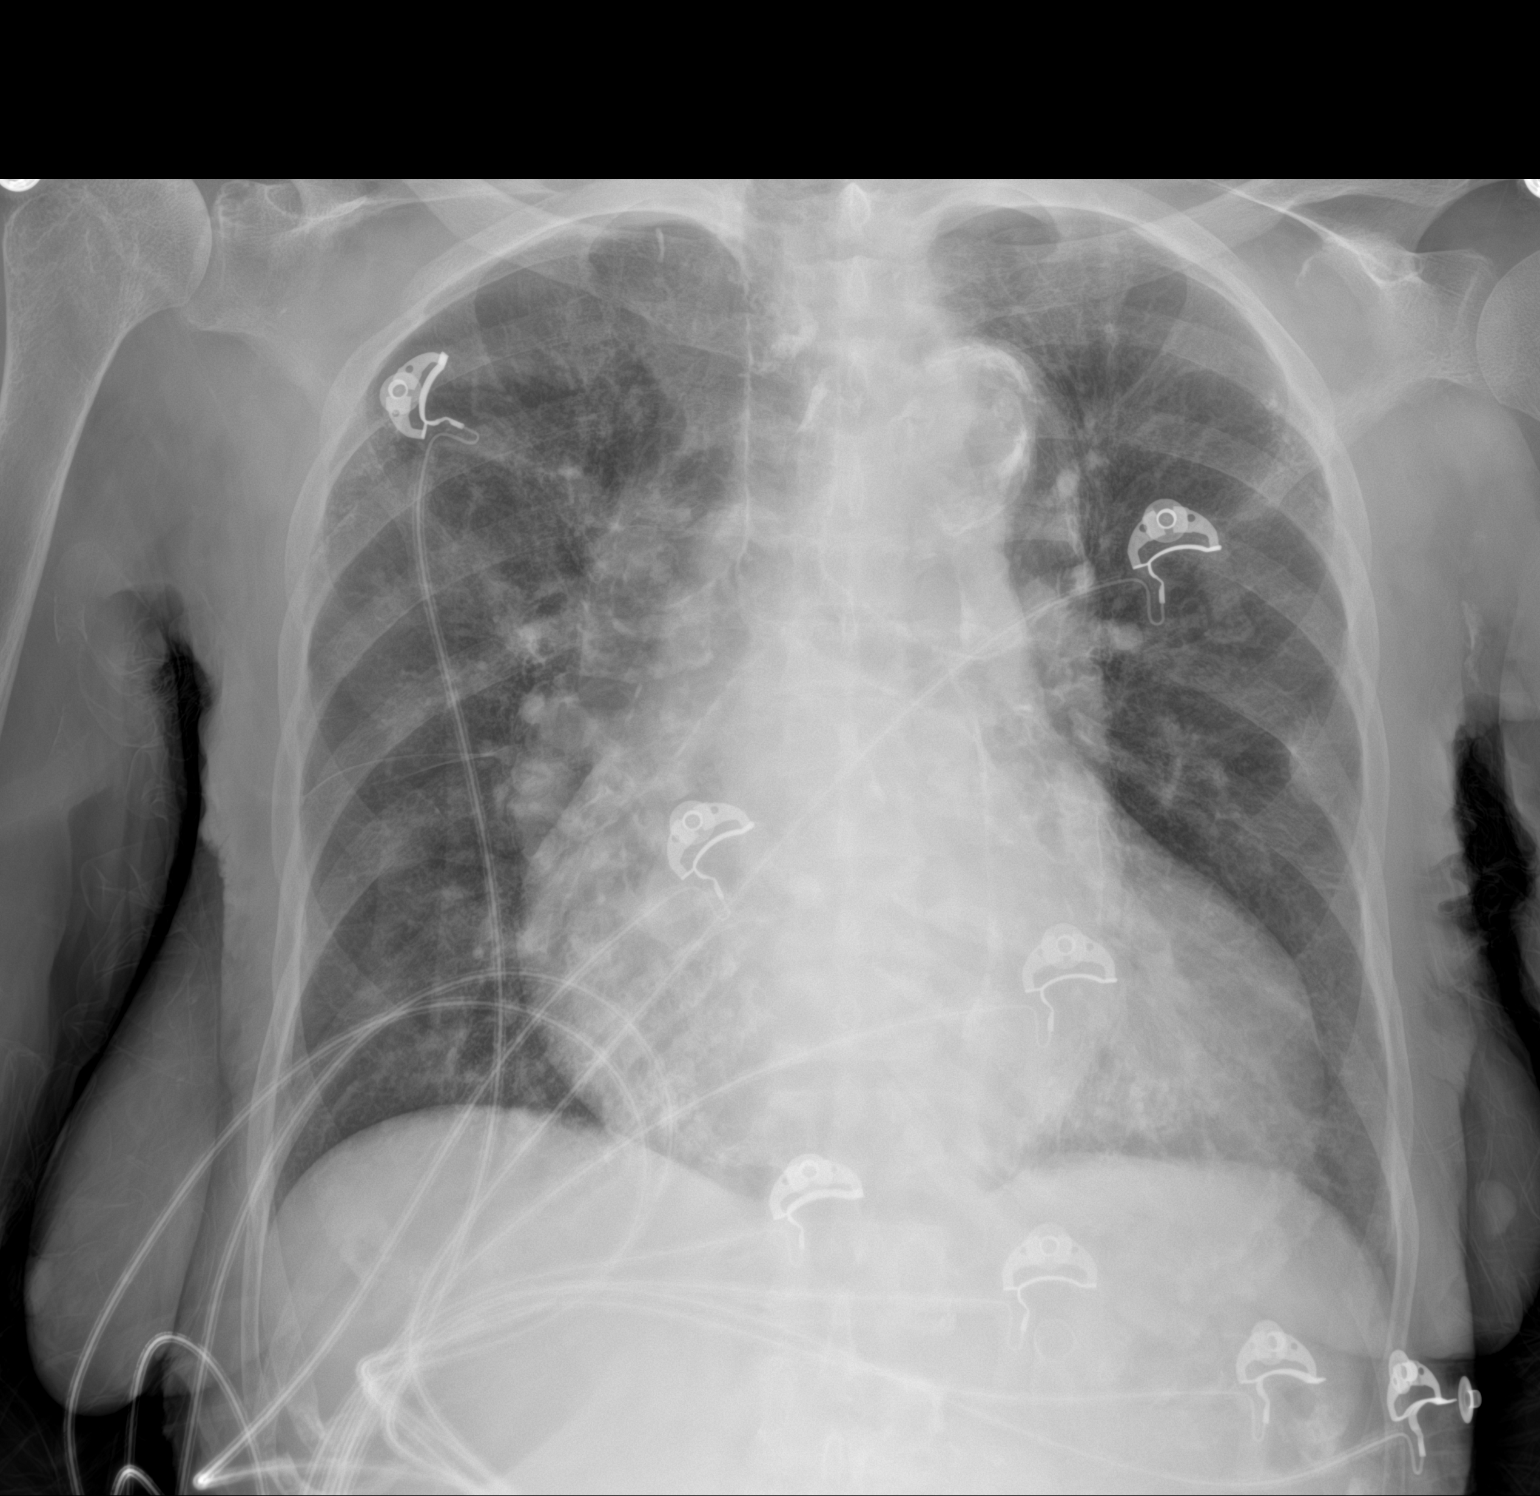

[1 of 1 positions shown; findings below may reference images not displayed]

FINDINGS: Unchanged cardiomegaly. Similar chronic pulmonary vascular
congestion and mild interstitial thickening, most apparent at the
left lung base. No focal consolidation, pleural effusion, or
pneumothorax. No acute osseous abnormality.
IMPRESSION: 1. Chronic cardiomegaly and mild pulmonary interstitial edema.

## 2022-09-01 IMAGING — CT CT ABD-PELV W/ CM
2 of 5 series · 16 of 46 positions shown, 18 images · IV contrast (Omni 300)
Comparison: 11/17/2021

CLINICAL DATA: Abdominal pain, known diverticulitis

EXAM:
CT ABDOMEN AND PELVIS WITH CONTRAST
TECHNIQUE: Multidetector CT imaging of the abdomen and pelvis was performed
using the standard protocol following bolus administration of
intravenous contrast.

[Series 4: a/p w/ 5mm · axial · 0.67mm/px · z∈[+834,+1159]mm · 13 of 75 slices shown, 15 images]
[im 5/75  soft-tissue]
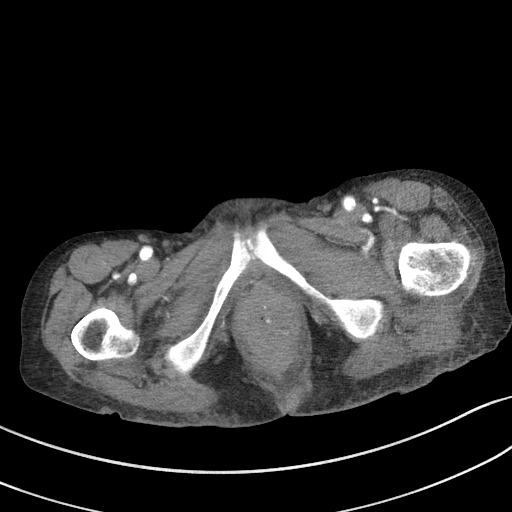
[im 5/75  bone]
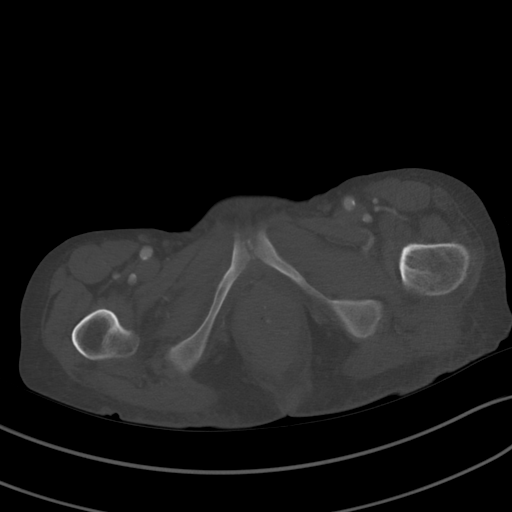
[im 9/75  soft-tissue]
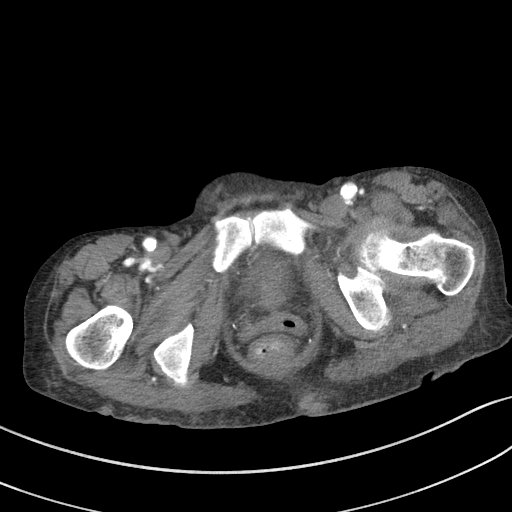
[im 17/75  soft-tissue]
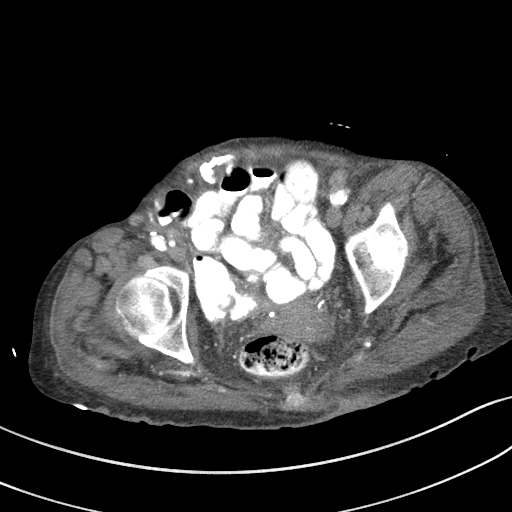
[im 21/75  soft-tissue]
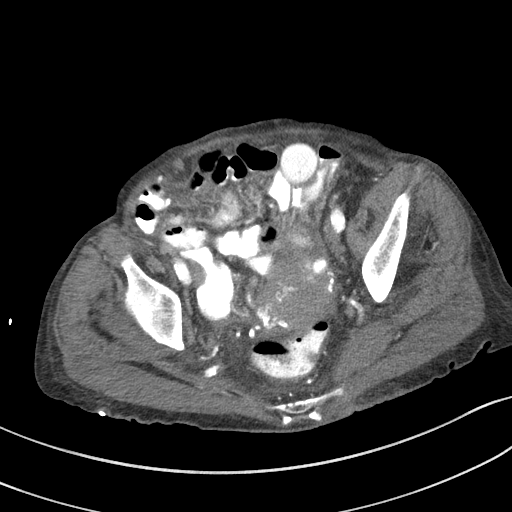
[im 25/75  soft-tissue]
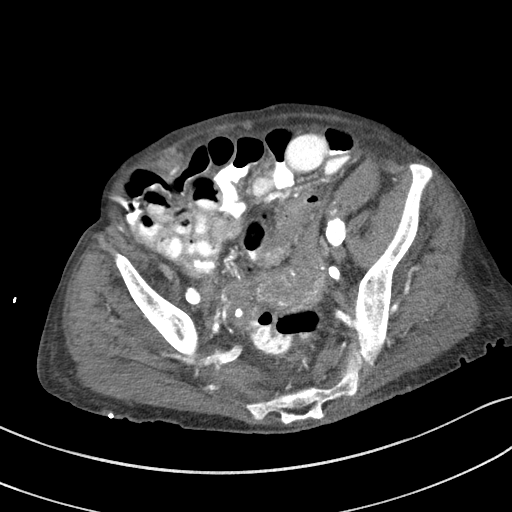
[im 33/75  soft-tissue]
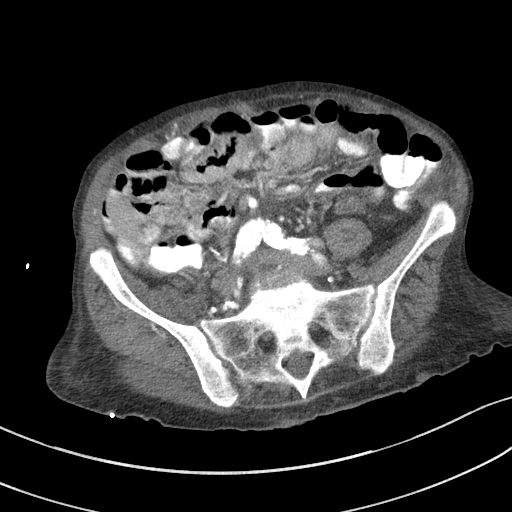
[im 38/75  soft-tissue]
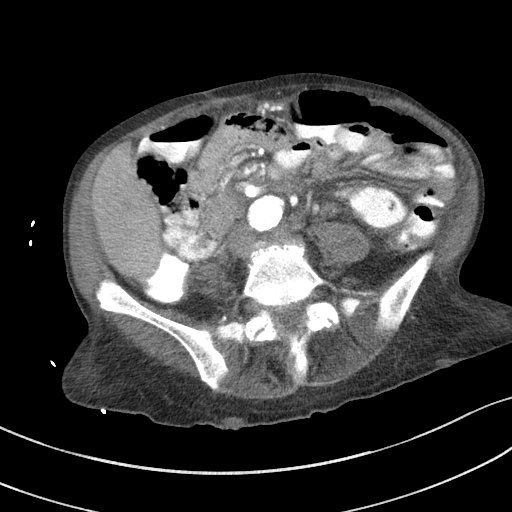
[im 42/75  soft-tissue]
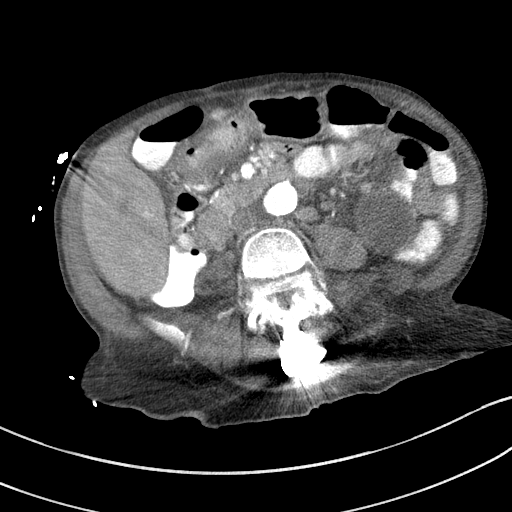
[im 50/75  soft-tissue]
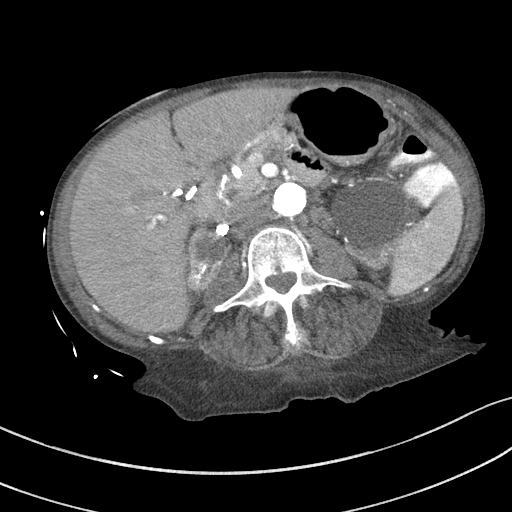
[im 50/75  bone]
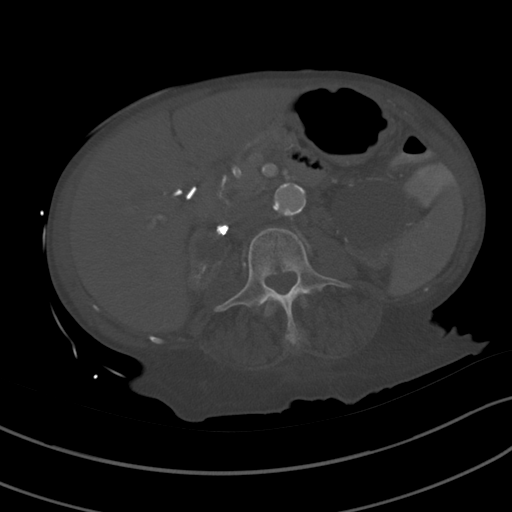
[im 54/75  soft-tissue]
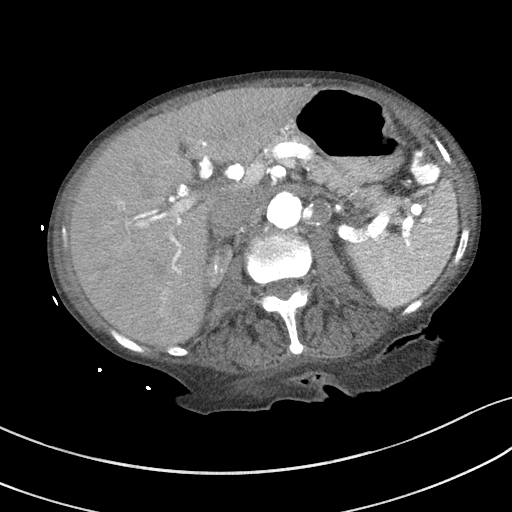
[im 58/75  soft-tissue]
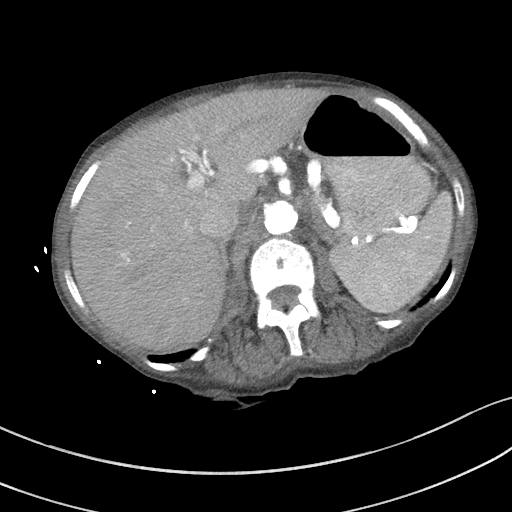
[im 66/75  soft-tissue]
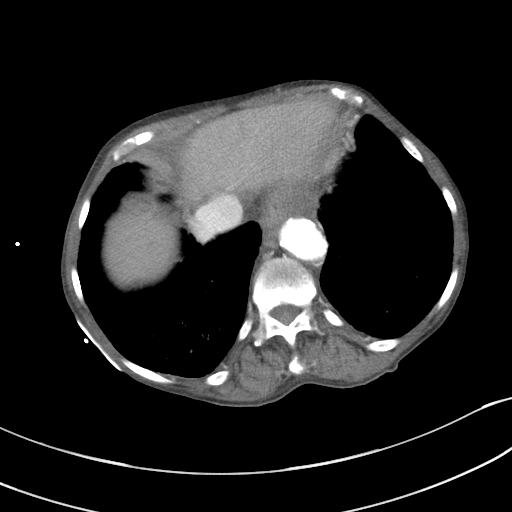
[im 70/75  soft-tissue]
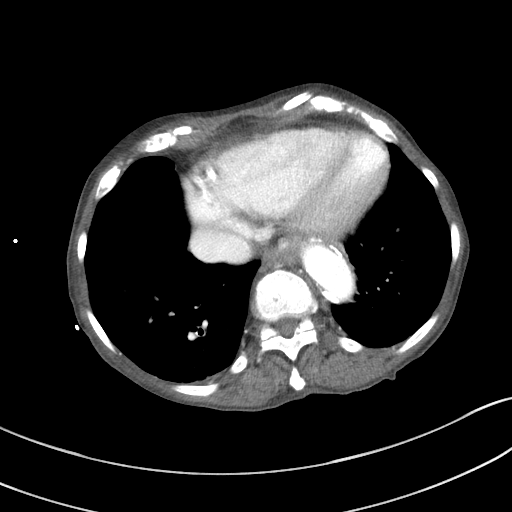

[Series 7: a/p w/ cor · coronal · 0.73mm/px · 3 of 148 slices shown]
[im 50/148  soft-tissue]
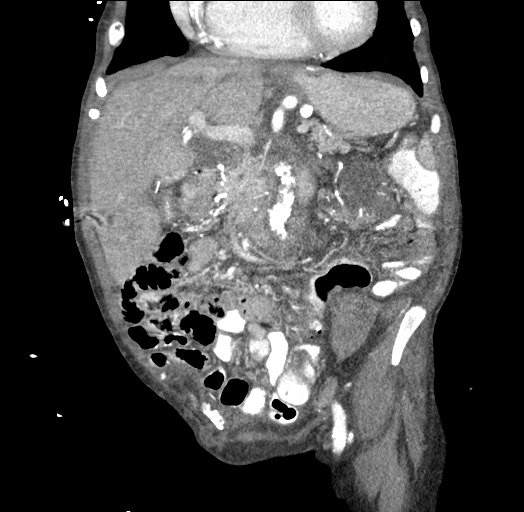
[im 66/148  soft-tissue]
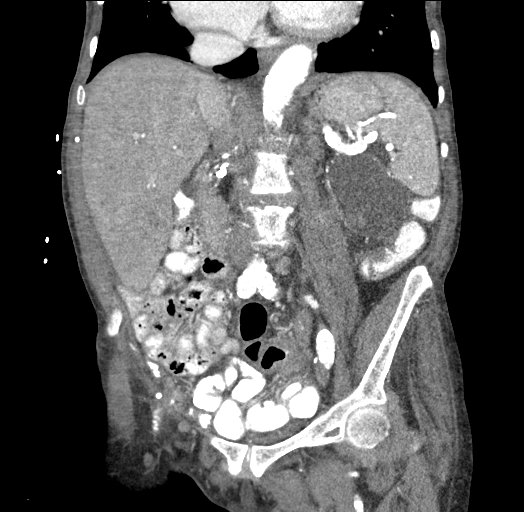
[im 82/148  soft-tissue]
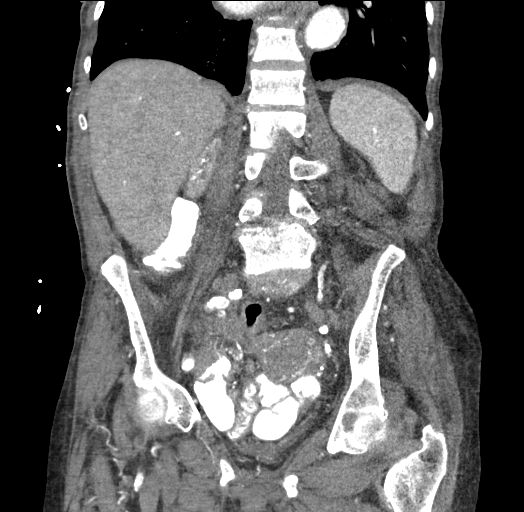

[16 of 46 positions shown; findings below may reference images not displayed]

RADIATION DOSE REDUCTION: This exam was performed according to the
departmental dose-optimization program which includes automated
exposure control, adjustment of the mA and/or kV according to
patient size and/or use of iterative reconstruction technique.

CONTRAST:  100mL OMNIPAQUE IOHEXOL 300 MG/ML  SOLN
FINDINGS: Lower chest: Sequela of prior aspirated barium at the lung bases
(series 5/image 10). Cardiomegaly.

Hepatobiliary: Liver is within normal limits.

Status post cholecystectomy. No intrahepatic or extrahepatic ductal
dilatation.

Pancreas: Within normal limits.

Spleen: Within normal limits.

Adrenals/Urinary Tract: Adrenal glands are within normal limits.

Bilateral renal atrophy with dominant 6.3 cm left renal cyst (series
4/image 29), previously 5.6 cm. No hydronephrosis.

Bladder is decompressed.

Stomach/Bowel: Stomach is within normal limits.

No evidence of bowel obstruction.

Appendix is not discretely visualized.

Sigmoid diverticulosis, without evidence of diverticulitis.

Vascular/Lymphatic: No evidence of abdominal aortic aneurysm.

Atherosclerotic calcifications of the abdominal aorta and branch
vessels. Stable 15 mm left renal artery aneurysm (series 4/image
22).

No suspicious abdominopelvic lymphadenopathy.

Reproductive: Uterus and right ovary are within normal limits.
Stable prominence of the left ovary without discrete mass.

Other: No abdominopelvic ascites.

Musculoskeletal: Degenerative changes at L4-5 with associated
postsurgical changes at this level.
IMPRESSION: Sigmoid diverticulosis, without evidence of diverticulitis.

Additional stable ancillary findings as above.

## 2022-09-02 DIAGNOSIS — R197 Diarrhea, unspecified: Secondary | ICD-10-CM | POA: Diagnosis not present

## 2022-09-02 DIAGNOSIS — N186 End stage renal disease: Secondary | ICD-10-CM | POA: Diagnosis not present

## 2022-09-02 DIAGNOSIS — L299 Pruritus, unspecified: Secondary | ICD-10-CM | POA: Diagnosis not present

## 2022-09-02 DIAGNOSIS — Z992 Dependence on renal dialysis: Secondary | ICD-10-CM | POA: Diagnosis not present

## 2022-09-02 DIAGNOSIS — N2581 Secondary hyperparathyroidism of renal origin: Secondary | ICD-10-CM | POA: Diagnosis not present

## 2022-09-02 DIAGNOSIS — D631 Anemia in chronic kidney disease: Secondary | ICD-10-CM | POA: Diagnosis not present

## 2022-09-04 DIAGNOSIS — L299 Pruritus, unspecified: Secondary | ICD-10-CM | POA: Diagnosis not present

## 2022-09-04 DIAGNOSIS — D631 Anemia in chronic kidney disease: Secondary | ICD-10-CM | POA: Diagnosis not present

## 2022-09-04 DIAGNOSIS — Z992 Dependence on renal dialysis: Secondary | ICD-10-CM | POA: Diagnosis not present

## 2022-09-04 DIAGNOSIS — R197 Diarrhea, unspecified: Secondary | ICD-10-CM | POA: Diagnosis not present

## 2022-09-04 DIAGNOSIS — N186 End stage renal disease: Secondary | ICD-10-CM | POA: Diagnosis not present

## 2022-09-04 DIAGNOSIS — N2581 Secondary hyperparathyroidism of renal origin: Secondary | ICD-10-CM | POA: Diagnosis not present

## 2022-09-07 ENCOUNTER — Other Ambulatory Visit: Payer: Self-pay

## 2022-09-07 ENCOUNTER — Emergency Department (HOSPITAL_COMMUNITY)
Admission: EM | Admit: 2022-09-07 | Discharge: 2022-09-07 | Disposition: A | Discharge status: Home / Self Care | Payer: Medicare Other | Source: Home / Self Care | Attending: Emergency Medicine | Admitting: Emergency Medicine

## 2022-09-07 ENCOUNTER — Encounter (HOSPITAL_COMMUNITY): Payer: Self-pay | Admitting: *Deleted

## 2022-09-07 ENCOUNTER — Emergency Department (HOSPITAL_COMMUNITY): Payer: Medicare Other

## 2022-09-07 DIAGNOSIS — F32A Depression, unspecified: Secondary | ICD-10-CM | POA: Diagnosis not present

## 2022-09-07 DIAGNOSIS — R0602 Shortness of breath: Secondary | ICD-10-CM | POA: Insufficient documentation

## 2022-09-07 DIAGNOSIS — I120 Hypertensive chronic kidney disease with stage 5 chronic kidney disease or end stage renal disease: Secondary | ICD-10-CM | POA: Insufficient documentation

## 2022-09-07 DIAGNOSIS — I70229 Atherosclerosis of native arteries of extremities with rest pain, unspecified extremity: Secondary | ICD-10-CM | POA: Diagnosis present

## 2022-09-07 DIAGNOSIS — R1084 Generalized abdominal pain: Secondary | ICD-10-CM | POA: Insufficient documentation

## 2022-09-07 DIAGNOSIS — N2889 Other specified disorders of kidney and ureter: Secondary | ICD-10-CM | POA: Diagnosis not present

## 2022-09-07 DIAGNOSIS — N186 End stage renal disease: Secondary | ICD-10-CM | POA: Diagnosis not present

## 2022-09-07 DIAGNOSIS — R41841 Cognitive communication deficit: Secondary | ICD-10-CM | POA: Diagnosis not present

## 2022-09-07 DIAGNOSIS — J9811 Atelectasis: Secondary | ICD-10-CM | POA: Diagnosis not present

## 2022-09-07 DIAGNOSIS — N184 Chronic kidney disease, stage 4 (severe): Secondary | ICD-10-CM | POA: Diagnosis not present

## 2022-09-07 DIAGNOSIS — G629 Polyneuropathy, unspecified: Secondary | ICD-10-CM | POA: Diagnosis not present

## 2022-09-07 DIAGNOSIS — R571 Hypovolemic shock: Secondary | ICD-10-CM | POA: Diagnosis not present

## 2022-09-07 DIAGNOSIS — D631 Anemia in chronic kidney disease: Secondary | ICD-10-CM | POA: Diagnosis not present

## 2022-09-07 DIAGNOSIS — J9600 Acute respiratory failure, unspecified whether with hypoxia or hypercapnia: Secondary | ICD-10-CM | POA: Diagnosis not present

## 2022-09-07 DIAGNOSIS — U071 COVID-19: Secondary | ICD-10-CM | POA: Diagnosis not present

## 2022-09-07 DIAGNOSIS — Z79899 Other long term (current) drug therapy: Secondary | ICD-10-CM | POA: Insufficient documentation

## 2022-09-07 DIAGNOSIS — I214 Non-ST elevation (NSTEMI) myocardial infarction: Secondary | ICD-10-CM | POA: Diagnosis not present

## 2022-09-07 DIAGNOSIS — R531 Weakness: Secondary | ICD-10-CM | POA: Diagnosis not present

## 2022-09-07 DIAGNOSIS — Z7189 Other specified counseling: Secondary | ICD-10-CM | POA: Diagnosis not present

## 2022-09-07 DIAGNOSIS — R0789 Other chest pain: Secondary | ICD-10-CM | POA: Diagnosis not present

## 2022-09-07 DIAGNOSIS — L299 Pruritus, unspecified: Secondary | ICD-10-CM | POA: Diagnosis not present

## 2022-09-07 DIAGNOSIS — I132 Hypertensive heart and chronic kidney disease with heart failure and with stage 5 chronic kidney disease, or end stage renal disease: Secondary | ICD-10-CM | POA: Diagnosis not present

## 2022-09-07 DIAGNOSIS — R109 Unspecified abdominal pain: Secondary | ICD-10-CM | POA: Diagnosis not present

## 2022-09-07 DIAGNOSIS — R6521 Severe sepsis with septic shock: Secondary | ICD-10-CM | POA: Diagnosis not present

## 2022-09-07 DIAGNOSIS — R1030 Lower abdominal pain, unspecified: Secondary | ICD-10-CM | POA: Diagnosis not present

## 2022-09-07 DIAGNOSIS — I517 Cardiomegaly: Secondary | ICD-10-CM | POA: Diagnosis not present

## 2022-09-07 DIAGNOSIS — J9601 Acute respiratory failure with hypoxia: Secondary | ICD-10-CM | POA: Diagnosis not present

## 2022-09-07 DIAGNOSIS — J1282 Pneumonia due to coronavirus disease 2019: Secondary | ICD-10-CM | POA: Diagnosis not present

## 2022-09-07 DIAGNOSIS — E785 Hyperlipidemia, unspecified: Secondary | ICD-10-CM | POA: Diagnosis not present

## 2022-09-07 DIAGNOSIS — I69354 Hemiplegia and hemiparesis following cerebral infarction affecting left non-dominant side: Secondary | ICD-10-CM | POA: Diagnosis not present

## 2022-09-07 DIAGNOSIS — Z8673 Personal history of transient ischemic attack (TIA), and cerebral infarction without residual deficits: Secondary | ICD-10-CM | POA: Diagnosis not present

## 2022-09-07 DIAGNOSIS — I700 Atherosclerosis of aorta: Secondary | ICD-10-CM | POA: Insufficient documentation

## 2022-09-07 DIAGNOSIS — E872 Acidosis, unspecified: Secondary | ICD-10-CM | POA: Diagnosis not present

## 2022-09-07 DIAGNOSIS — I2510 Atherosclerotic heart disease of native coronary artery without angina pectoris: Secondary | ICD-10-CM | POA: Diagnosis not present

## 2022-09-07 DIAGNOSIS — E860 Dehydration: Secondary | ICD-10-CM | POA: Insufficient documentation

## 2022-09-07 DIAGNOSIS — Z7401 Bed confinement status: Secondary | ICD-10-CM | POA: Diagnosis not present

## 2022-09-07 DIAGNOSIS — I5033 Acute on chronic diastolic (congestive) heart failure: Secondary | ICD-10-CM | POA: Diagnosis not present

## 2022-09-07 DIAGNOSIS — Z992 Dependence on renal dialysis: Secondary | ICD-10-CM | POA: Insufficient documentation

## 2022-09-07 DIAGNOSIS — I480 Paroxysmal atrial fibrillation: Secondary | ICD-10-CM | POA: Diagnosis not present

## 2022-09-07 DIAGNOSIS — A419 Sepsis, unspecified organism: Secondary | ICD-10-CM | POA: Diagnosis not present

## 2022-09-07 DIAGNOSIS — R197 Diarrhea, unspecified: Secondary | ICD-10-CM

## 2022-09-07 DIAGNOSIS — K219 Gastro-esophageal reflux disease without esophagitis: Secondary | ICD-10-CM | POA: Diagnosis not present

## 2022-09-07 DIAGNOSIS — R262 Difficulty in walking, not elsewhere classified: Secondary | ICD-10-CM | POA: Diagnosis not present

## 2022-09-07 DIAGNOSIS — R64 Cachexia: Secondary | ICD-10-CM | POA: Diagnosis present

## 2022-09-07 DIAGNOSIS — R279 Unspecified lack of coordination: Secondary | ICD-10-CM | POA: Diagnosis not present

## 2022-09-07 DIAGNOSIS — R4182 Altered mental status, unspecified: Secondary | ICD-10-CM | POA: Diagnosis not present

## 2022-09-07 DIAGNOSIS — A4189 Other specified sepsis: Secondary | ICD-10-CM | POA: Diagnosis not present

## 2022-09-07 DIAGNOSIS — E440 Moderate protein-calorie malnutrition: Secondary | ICD-10-CM | POA: Diagnosis not present

## 2022-09-07 DIAGNOSIS — Z515 Encounter for palliative care: Secondary | ICD-10-CM | POA: Diagnosis not present

## 2022-09-07 DIAGNOSIS — Z681 Body mass index (BMI) 19 or less, adult: Secondary | ICD-10-CM | POA: Diagnosis not present

## 2022-09-07 DIAGNOSIS — R2689 Other abnormalities of gait and mobility: Secondary | ICD-10-CM | POA: Diagnosis not present

## 2022-09-07 DIAGNOSIS — I959 Hypotension, unspecified: Secondary | ICD-10-CM | POA: Diagnosis not present

## 2022-09-07 DIAGNOSIS — E875 Hyperkalemia: Secondary | ICD-10-CM | POA: Insufficient documentation

## 2022-09-07 DIAGNOSIS — Z66 Do not resuscitate: Secondary | ICD-10-CM | POA: Diagnosis not present

## 2022-09-07 DIAGNOSIS — I499 Cardiac arrhythmia, unspecified: Secondary | ICD-10-CM | POA: Diagnosis not present

## 2022-09-07 DIAGNOSIS — T8612 Kidney transplant failure: Secondary | ICD-10-CM | POA: Diagnosis not present

## 2022-09-07 DIAGNOSIS — I10 Essential (primary) hypertension: Secondary | ICD-10-CM | POA: Diagnosis not present

## 2022-09-07 DIAGNOSIS — R1312 Dysphagia, oropharyngeal phase: Secondary | ICD-10-CM | POA: Diagnosis not present

## 2022-09-07 DIAGNOSIS — N2581 Secondary hyperparathyroidism of renal origin: Secondary | ICD-10-CM | POA: Diagnosis not present

## 2022-09-07 DIAGNOSIS — Z940 Kidney transplant status: Secondary | ICD-10-CM | POA: Diagnosis not present

## 2022-09-07 DIAGNOSIS — I5083 High output heart failure: Secondary | ICD-10-CM | POA: Diagnosis present

## 2022-09-07 DIAGNOSIS — G4700 Insomnia, unspecified: Secondary | ICD-10-CM | POA: Diagnosis not present

## 2022-09-07 DIAGNOSIS — M6281 Muscle weakness (generalized): Secondary | ICD-10-CM | POA: Diagnosis not present

## 2022-09-07 LAB — CBC WITH DIFFERENTIAL/PLATELET
Abs Immature Granulocytes: 0.02 10*3/uL (ref 0.00–0.07)
Basophils Absolute: 0 10*3/uL (ref 0.0–0.1)
Basophils Relative: 1 %
Eosinophils Absolute: 0.3 10*3/uL (ref 0.0–0.5)
Eosinophils Relative: 6 %
HCT: 34.3 % — ABNORMAL LOW (ref 36.0–46.0)
Hemoglobin: 11.1 g/dL — ABNORMAL LOW (ref 12.0–15.0)
Immature Granulocytes: 0 %
Lymphocytes Relative: 27 %
Lymphs Abs: 1.6 10*3/uL (ref 0.7–4.0)
MCH: 31.4 pg (ref 26.0–34.0)
MCHC: 32.4 g/dL (ref 30.0–36.0)
MCV: 97.2 fL (ref 80.0–100.0)
Monocytes Absolute: 0.8 10*3/uL (ref 0.1–1.0)
Monocytes Relative: 14 %
Neutro Abs: 3 10*3/uL (ref 1.7–7.7)
Neutrophils Relative %: 52 %
Platelets: 195 10*3/uL (ref 150–400)
RBC: 3.53 MIL/uL — ABNORMAL LOW (ref 3.87–5.11)
RDW: 19 % — ABNORMAL HIGH (ref 11.5–15.5)
WBC: 5.8 10*3/uL (ref 4.0–10.5)
nRBC: 0 % (ref 0.0–0.2)

## 2022-09-07 LAB — I-STAT VENOUS BLOOD GAS, ED
Acid-Base Excess: 1 mmol/L (ref 0.0–2.0)
Bicarbonate: 21.8 mmol/L (ref 20.0–28.0)
Calcium, Ion: 0.78 mmol/L — CL (ref 1.15–1.40)
HCT: 34 % — ABNORMAL LOW (ref 36.0–46.0)
Hemoglobin: 11.6 g/dL — ABNORMAL LOW (ref 12.0–15.0)
O2 Saturation: 98 %
Potassium: 5.8 mmol/L — ABNORMAL HIGH (ref 3.5–5.1)
Sodium: 129 mmol/L — ABNORMAL LOW (ref 135–145)
TCO2: 23 mmol/L (ref 22–32)
pCO2, Ven: 23.7 mmHg — ABNORMAL LOW (ref 44–60)
pH, Ven: 7.572 — ABNORMAL HIGH (ref 7.25–7.43)
pO2, Ven: 86 mmHg — ABNORMAL HIGH (ref 32–45)

## 2022-09-07 LAB — COMPREHENSIVE METABOLIC PANEL
ALT: 28 U/L (ref 0–44)
AST: 44 U/L — ABNORMAL HIGH (ref 15–41)
Albumin: 3.2 g/dL — ABNORMAL LOW (ref 3.5–5.0)
Alkaline Phosphatase: 206 U/L — ABNORMAL HIGH (ref 38–126)
Anion gap: 20 — ABNORMAL HIGH (ref 5–15)
BUN: 78 mg/dL — ABNORMAL HIGH (ref 8–23)
CO2: 22 mmol/L (ref 22–32)
Calcium: 8.5 mg/dL — ABNORMAL LOW (ref 8.9–10.3)
Chloride: 92 mmol/L — ABNORMAL LOW (ref 98–111)
Creatinine, Ser: 5.78 mg/dL — ABNORMAL HIGH (ref 0.44–1.00)
GFR, Estimated: 7 mL/min — ABNORMAL LOW (ref >60)
Glucose, Bld: 80 mg/dL (ref 70–99)
Potassium: 6.1 mmol/L — ABNORMAL HIGH (ref 3.5–5.1)
Sodium: 134 mmol/L — ABNORMAL LOW (ref 135–145)
Total Bilirubin: 0.7 mg/dL (ref 0.3–1.2)
Total Protein: 6.6 g/dL (ref 6.5–8.1)

## 2022-09-07 LAB — TROPONIN I (HIGH SENSITIVITY)
Troponin I (High Sensitivity): 65 ng/L — ABNORMAL HIGH (ref <18)
Troponin I (High Sensitivity): 55 ng/L — ABNORMAL HIGH (ref <18)

## 2022-09-07 LAB — C DIFFICILE QUICK SCREEN W PCR REFLEX
C Diff antigen: NEGATIVE
C Diff interpretation: NOT DETECTED
C Diff toxin: NEGATIVE

## 2022-09-07 LAB — LIPASE, BLOOD: Lipase: 49 U/L (ref 11–51)

## 2022-09-07 MED ORDER — LOPERAMIDE HCL 2 MG PO CAPS: 2.0000 mg | ORAL_CAPSULE | Freq: Four times a day (QID) | ORAL | 0 refills | Status: DC | PRN

## 2022-09-07 MED ORDER — ONDANSETRON 4 MG PO TBDP
4.0000 mg | ORAL_TABLET | Freq: Once | ORAL | Status: AC
  Administered 2022-09-07: 4 mg via ORAL

## 2022-09-07 MED ORDER — LOPERAMIDE HCL 2 MG PO CAPS
2.0000 mg | ORAL_CAPSULE | Freq: Once | ORAL | Status: AC
  Administered 2022-09-07: 2 mg via ORAL
  Filled 2022-09-07: qty 1

## 2022-09-07 NOTE — Discharge Instructions (Addendum)
Please continue with your normal dialysis schedule.  You have been given 1 dose of Imodium today.  Please continue to take this at home as needed for any further diarrhea.  Please return if you start to feel worse, especially if you have dizziness, feeling as though you may pass out, palpitations, chest pain or recurrent shortness of breath.  Read the attachment about good food choices and diarrheal illnesses. It was a pleasure to meet you and we hope you feel better!

## 2022-09-07 NOTE — ED Triage Notes (Signed)
Per EMS, pt from home, w/ family. C/O abd pn and SOB.  ABD pn ongoing X3 weeks, N/V/D.    SOB has started tonight.  Pt on Dialysis M/W/F, stopped treatment early on Friday due to abd pn.  Pt is currently using Right arm.    Pt is alert and oriented.     HR 60 BP 112 pal on leg RR 16 O2 98% RA CBG 114

## 2022-09-07 NOTE — ED Provider Triage Note (Signed)
Emergency Medicine Provider Triage Evaluation Note  [name] , a 71 y.o. female  was evaluated in triage.  Pt complains of diarrhea x1 week, generalized abdominal pain nausea dry heaving in triage.  HD with last dialysis Friday..  Review of Systems  Positive: As above Negative: Chest pain, syncope  Physical Exam  BP 101/80 (BP Location: Right Leg)   Pulse 63   Temp 98 F (36.7 C) (Oral)   Resp 15   SpO2 91%  Gen:   Awake, dry heaving Resp:  Normal effort  MSK:   Moves extremities without difficulty  Other:  Tenderness abdominal pain, having diarrhea and vomiting throughout exam.  Medical Decision Making  Medically screening exam initiated at 4:09 AM.  Appropriate orders placed.  [name] was informed that the remainder of the evaluation will be completed by another provider, this initial triage assessment does not replace that evaluation, and the importance of remaining in the ED until their evaluation is complete.  Fistulas bilaterally, using fistula in the leg at this time for dialysis, may draw blood from left arm  This chart was dictated using voice recognition software, Dragon. Despite the best efforts of this provider to proofread and correct errors, errors may still occur which can change documentation meaning.    Emeline Darling, PA-C 09/07/22 0410

## 2022-09-07 NOTE — ED Provider Notes (Signed)
[facility] EMERGENCY DEPARTMENT Provider Note   CSN: [id number] Arrival date & time: 09/07/22  7564     History  Chief Complaint  Patient presents with   Abdominal Pain    [name] is a 71 y.o. 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  She says that she called 911 this morning because she felt as though she cannot catch her breath.  Says that her last dialysis was Friday but incomplete because she was having severe abdominal pain.  Had associated NVD.  Has been complaining of abdominal pain on and off for the last couple of weeks.  She currently says that she feels some chest tightness but would not describe herself is short of breath at this time.  Denies any current nausea but does say that she continues to have diarrhea multiple times every day.   Abdominal Pain Associated symptoms: diarrhea, nausea and vomiting        Home Medications Prior to Admission medications   Medication Sig Start Date End Date Taking? Authorizing Provider  acetaminophen (TYLENOL) 500 MG tablet Take 1 tablet (500 mg total) by mouth every 6 (six) hours as needed. Patient taking differently: Take 500 mg by mouth every 6 (six) hours as needed (pain). 09/05/18   Law, Bea Graff, PA-C  albuterol (PROVENTIL) (2.5 MG/3ML) 0.083% nebulizer solution Take 3 mLs (2.5 mg total) by nebulization every 4 (four) hours as needed for wheezing. Patient taking differently: Take 2.5 mg by nebulization every 6 (six) hours as needed for wheezing. 05/04/22   Elgergawy, Silver Huguenin, MD  albuterol (VENTOLIN HFA) 108 (90 Base) MCG/ACT inhaler Inhale 1-2 puffs into the lungs every 6 (six) hours as needed for wheezing or shortness of breath. Patient taking differently: Inhale 2 puffs into the lungs every 6 (six) hours as needed for shortness of breath. 01/11/22   Mesner, Corene Cornea, MD  atorvastatin (LIPITOR) 80 MG tablet  Take 80 mg by mouth at bedtime. 03/25/22   [provider]  B Complex-C-Folic Acid (DIALYVITE 332) 0.8 MG WAFR Take 1 tablet by mouth every morning. 01/28/22   [provider]  clopidogrel (PLAVIX) 75 MG tablet Take 1 tablet (75 mg total) by mouth daily. Patient taking differently: Take 75 mg by mouth at bedtime. 03/26/22 03/26/23  Marty Heck, MD  doxycycline (VIBRA-TABS) 100 MG tablet Take 1 tablet (100 mg total) by mouth 2 (two) times daily. 07/02/22   Laurice Record, MD  escitalopram (LEXAPRO) 10 MG tablet Take 10 mg by mouth every morning. 03/25/22   [provider]  hydrOXYzine (ATARAX) 25 MG tablet Take 1 tablet (25 mg total) by mouth every 8 (eight) hours as needed for itching. 05/21/22   Mercy Riding, MD  midodrine (PROAMATINE) 10 MG tablet Take 1 tablet (10 mg total) by mouth 3 (three) times daily with meals. 05/21/22   Mercy Riding, MD  Nutritional Supplements (NEPRO) LIQD Take 237 mLs by mouth 2 (two) times daily between meals.    [provider]  ondansetron (ZOFRAN) 8 MG tablet Take 8 mg by mouth every 12 (twelve) hours as needed for nausea or vomiting.    [provider]  OVER THE COUNTER MEDICATION Take 1 tablet by mouth every morning. Folic acid - vitamin B complex - vitamin C - selenium - zinc 3 mg    [provider]  pantoprazole (PROTONIX) 40 MG tablet Take 1 tablet (  40 mg total) by mouth daily. 08/18/21   Nita Sells, MD  sevelamer (RENAGEL) 800 MG tablet Take 1,600 mg by mouth 3 (three) times daily with meals. 03/24/22   [provider]  zolpidem (AMBIEN) 5 MG tablet Take 1 tablet (5 mg total) by mouth at bedtime as needed for up to 10 days for sleep (Insomnia). 05/21/22 05/31/22  Mercy Riding, MD      Allergies    Sulfa antibiotics and Adhesive [tape]    Review of Systems   Review of Systems  Gastrointestinal:  Positive for abdominal pain, diarrhea, nausea and vomiting.    Physical Exam Updated Vital  Signs BP (!) 111/44 (BP Location: Right Leg)   Pulse 62   Temp 97.9 F (36.6 C)   Resp 16   SpO2 96%  Physical Exam Vitals and nursing note reviewed.  Constitutional:      General: She is not in acute distress.    Appearance: Normal appearance. She is not ill-appearing.  HENT:     Head: Normocephalic and atraumatic.  Eyes:     General: No scleral icterus.    Conjunctiva/sclera: Conjunctivae normal.  Cardiovascular:     Rate and Rhythm: Normal rate and regular rhythm.     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 No respiratory distress.  Abdominal:     General: Abdomen is flat.     Palpations: Abdomen is soft.     Tenderness: There is generalized abdominal tenderness.  Skin:    General: Skin is warm and dry.     Findings: No rash.  Neurological:     Mental Status: She is alert.  Psychiatric:        Mood and Affect: Mood normal.     ED Results / Procedures / Treatments   Labs (all labs ordered are listed, but only abnormal results are displayed) Labs Reviewed  COMPREHENSIVE METABOLIC PANEL - Abnormal; Notable for the following components:      Result Value   Sodium 134 (*)    Potassium 6.1 (*)    Chloride 92 (*)    BUN 78 (*)    Creatinine, Ser 5.78 (*)    Calcium 8.5 (*)    Albumin 3.2 (*)    AST 44 (*)    Alkaline Phosphatase 206 (*)    GFR, Estimated 7 (*)    Anion gap 20 (*)    All other components within normal limits  CBC WITH DIFFERENTIAL/PLATELET - Abnormal; Notable for the following components:   RBC 3.53 (*)    Hemoglobin 11.1 (*)    HCT 34.3 (*)    RDW 19.0 (*)    All other components within normal limits  I-STAT VENOUS BLOOD GAS, ED - Abnormal; Notable for the following components:   pH, Ven 7.572 (*)    pCO2, Ven 23.7 (*)    pO2, Ven 86 (*)    Sodium 129 (*)    Potassium 5.8 (*)    Calcium, Ion 0.78 (*)    HCT 34.0 (*)    Hemoglobin 11.6 (*)    All other components within  normal limits  TROPONIN I (HIGH SENSITIVITY) - Abnormal; Notable for the following components:   Troponin I (High Sensitivity) 65 (*)    All other components within normal limits  TROPONIN I (HIGH SENSITIVITY) - Abnormal; Notable for the following components:   Troponin I (High Sensitivity) 55 (*)    All other  components within normal limits  C DIFFICILE QUICK SCREEN W PCR REFLEX    LIPASE, BLOOD    EKG None  Radiology DG Chest Portable 1 View  Result Date: 09/07/2022 CLINICAL DATA:  71 year old female with shortness of breath and abdominal pain. EXAM: PORTABLE CHEST 1 VIEW COMPARISON:  Chest radiographs 08/18/2022 and earlier. FINDINGS: Portable AP semi upright view at 0410 hours. Moderate to severe cardiomegaly does not appear significantly changed. Tortuous and calcified thoracic aorta contour is stable. Other mediastinal contours are within normal limits. Stable lung volumes and ventilation. No pneumothorax, pulmonary edema, pleural effusion, or consolidation. Chronic subpleural scarring in the upper lobes appears stable from CT chest 04/26/2022. Paucity of bowel gas in the visible upper abdomen. IMPRESSION: Stable pronounced Cardiomegaly and Aortic Atherosclerosis (ICD10-I70.0). No acute cardiopulmonary abnormality. Electronically Signed   By: Genevie Ann M.D.   On: 09/07/2022 04:44    Procedures Procedures   Medications Ordered in ED Medications  ondansetron (ZOFRAN-ODT) disintegrating tablet 4 mg (4 mg Oral Given 09/07/22 0411)    ED Course/ Medical Decision Making/ A&P Clinical Course as of 09/07/22 1129  Mon Sep 07, 2022  0814 Per RN pt drops O2 to 88% on RA when sleeping [MR]  1128 Per nursing staff patient was able to stand up and walk.  She said that she was feeling tired but alert [MR]    Clinical Course User Index [MR] Dawsyn Zurn, Cecilio Asper, PA-C                           Medical Decision Making Amount and/or Complexity of Data Reviewed Radiology:  ordered.  Risk Prescription drug management.   This is a 71 year old female who presents to the ED for concern of shortness of breath/abd pain. The emergent differential diagnosis for shortness of breath includes, but is not limited to, Pulmonary edema, bronchoconstriction, Pneumonia, Pulmonary embolism, Pneumotherax/ Hemothorax, Dysrythmia, ACS. The differential diagnosis for generalized abdominal pain includes, but is not limited to AAA, gastroenteritis, appendicitis, Bowel obstruction, Bowel perforation. Gastroparesis, DKA, Hernia, Inflammatory bowel disease, mesenteric ischemia, pancreatitis, peritonitis SBP, volvulus.     This is not an exhaustive differential.    Past Medical History / Co-morbidities / Social History: ESRD on hemodialysis   Additional history: Per chart review patient presented to the emergency department with shortness of breath 3 weeks ago.  Admission was considered however ultimately hospitalist declined admission and consulted on the patient.  Patient was dialyzed and sent home.  She also reported a history of C. difficile at that time.  She had diarrhea throughout her admission.   Physical Exam: Pertinent physical exam findings include Alert and oriented Mildly tender abdomen Distal pulses intact Clear lungs, normal cardiac auscultation  Lab Tests: I ordered, and personally interpreted labs.  The pertinent results include: Potassium 6.1 Creatinine 5.78 Alk phos 206, AST 44   Imaging Studies: I ordered and independently visualized and interpreted chest x-ray and I agree with the radiologist that there are no signs of fluid overload or other acute findings.  Because of her abdominal pain, NVD I ordered a CT abdomen without contrast.  I reviewed this and there are no acute findings.    Cardiac Monitoring:  The patient was maintained on a cardiac monitor.  No signs of arrhythmia or cardiac signs of hyperkalemia   Medications: I ordered medication  including Imodium. Reevaluation of the patient after these medicines showed that the patient improved. I have reviewed the patients home  medicines and have made adjustments as needed.   MDM/Disposition: This is a 71 year old female who presented today with shortness of breath.  She said as though her chest felt somewhat tight but this is since resolved.  Reports that she only did part of her dialysis session on Friday.  Lab work revealing of hyperkalemia, but EKG nonconcerning for arrhythmia.,  Troponins were flat, mildly elevated but likely secondary to renal disease.  CT of her abdomen showed no acute findings.  Chest x-ray showed no signs of pulmonary edema.  She is low likelihood Wells criteria for PE score.  Her bump to liver enzymes are baseline.  I considered admission for her diarrheal illness with concerns for dehydration however patient is hemodynamically stable.  She is able to ambulate on her own and at this time I believe patient is stable for discharge.  She has been treated with Imodium.  Given Zofran in the lobby.  I will prescribe Imodium but she says that she is no longer nauseated.  Will hold this due to her hyperkalemia and risk factors for arrhythmia.   I discussed this case with my attending physician Dr. Nechama Guard who cosigned this note including patient's presenting symptoms, physical exam, and planned diagnostics and interventions. Attending physician stated agreement with plan or made changes to plan which were implemented.      Final Clinical Impression(s) / ED Diagnoses Final diagnoses:  Diarrhea, unspecified type    Rx / DC Orders ED Discharge Orders          Ordered    loperamide (IMODIUM) 2 MG capsule  4 times daily PRN        09/07/22 1041           Results and diagnoses were explained to the patient. Return precautions discussed in full. Patient had no additional questions and expressed complete understanding.   This chart was dictated using voice  recognition software.  Despite best efforts to proofread,  errors can occur which can change the documentation meaning.    Darliss Ridgel 09/07/22 1129    Elgie Congo, MD 09/07/22 1901

## 2022-09-07 NOTE — ED Triage Notes (Signed)
Lower midline abdominal pain and diarrhea. Nausea. Denies fevers. SOB, last dialysis on Friday. + cough. COVID negative at home

## 2022-09-09 DIAGNOSIS — L299 Pruritus, unspecified: Secondary | ICD-10-CM | POA: Diagnosis not present

## 2022-09-09 DIAGNOSIS — D631 Anemia in chronic kidney disease: Secondary | ICD-10-CM | POA: Diagnosis not present

## 2022-09-09 DIAGNOSIS — N2581 Secondary hyperparathyroidism of renal origin: Secondary | ICD-10-CM | POA: Diagnosis not present

## 2022-09-09 DIAGNOSIS — R197 Diarrhea, unspecified: Secondary | ICD-10-CM | POA: Diagnosis not present

## 2022-09-09 DIAGNOSIS — N186 End stage renal disease: Secondary | ICD-10-CM | POA: Diagnosis not present

## 2022-09-09 DIAGNOSIS — Z992 Dependence on renal dialysis: Secondary | ICD-10-CM | POA: Diagnosis not present

## 2022-09-10 ENCOUNTER — Emergency Department (HOSPITAL_COMMUNITY): Payer: Medicare Other

## 2022-09-10 ENCOUNTER — Inpatient Hospital Stay (HOSPITAL_COMMUNITY)
Admission: EM | Admit: 2022-09-10 | Discharge: 2022-09-17 | DRG: 871 | Disposition: A | Discharge status: Skilled Nursing Facility | Payer: Medicare Other | Attending: Internal Medicine | Admitting: Internal Medicine

## 2022-09-10 ENCOUNTER — Encounter (HOSPITAL_COMMUNITY): Payer: Self-pay | Admitting: Critical Care Medicine

## 2022-09-10 ENCOUNTER — Inpatient Hospital Stay (HOSPITAL_COMMUNITY): Payer: Medicare Other

## 2022-09-10 DIAGNOSIS — E785 Hyperlipidemia, unspecified: Secondary | ICD-10-CM | POA: Diagnosis present

## 2022-09-10 DIAGNOSIS — A4189 Other specified sepsis: Secondary | ICD-10-CM | POA: Diagnosis present

## 2022-09-10 DIAGNOSIS — Z7189 Other specified counseling: Secondary | ICD-10-CM | POA: Diagnosis not present

## 2022-09-10 DIAGNOSIS — J9601 Acute respiratory failure with hypoxia: Secondary | ICD-10-CM | POA: Diagnosis present

## 2022-09-10 DIAGNOSIS — Z940 Kidney transplant status: Secondary | ICD-10-CM | POA: Diagnosis not present

## 2022-09-10 DIAGNOSIS — N184 Chronic kidney disease, stage 4 (severe): Secondary | ICD-10-CM | POA: Diagnosis not present

## 2022-09-10 DIAGNOSIS — Z823 Family history of stroke: Secondary | ICD-10-CM

## 2022-09-10 DIAGNOSIS — R6521 Severe sepsis with septic shock: Secondary | ICD-10-CM | POA: Diagnosis not present

## 2022-09-10 DIAGNOSIS — Z86010 Personal history of colonic polyps: Secondary | ICD-10-CM

## 2022-09-10 DIAGNOSIS — Z992 Dependence on renal dialysis: Secondary | ICD-10-CM

## 2022-09-10 DIAGNOSIS — I9589 Other hypotension: Secondary | ICD-10-CM | POA: Diagnosis present

## 2022-09-10 DIAGNOSIS — Z515 Encounter for palliative care: Secondary | ICD-10-CM | POA: Diagnosis not present

## 2022-09-10 DIAGNOSIS — Z8673 Personal history of transient ischemic attack (TIA), and cerebral infarction without residual deficits: Secondary | ICD-10-CM

## 2022-09-10 DIAGNOSIS — I5033 Acute on chronic diastolic (congestive) heart failure: Secondary | ICD-10-CM | POA: Diagnosis present

## 2022-09-10 DIAGNOSIS — I5083 High output heart failure: Secondary | ICD-10-CM | POA: Diagnosis present

## 2022-09-10 DIAGNOSIS — I480 Paroxysmal atrial fibrillation: Secondary | ICD-10-CM | POA: Diagnosis present

## 2022-09-10 DIAGNOSIS — K219 Gastro-esophageal reflux disease without esophagitis: Secondary | ICD-10-CM | POA: Diagnosis present

## 2022-09-10 DIAGNOSIS — R6510 Systemic inflammatory response syndrome (SIRS) of non-infectious origin without acute organ dysfunction: Principal | ICD-10-CM

## 2022-09-10 DIAGNOSIS — E875 Hyperkalemia: Secondary | ICD-10-CM | POA: Diagnosis present

## 2022-09-10 DIAGNOSIS — N186 End stage renal disease: Secondary | ICD-10-CM | POA: Diagnosis not present

## 2022-09-10 DIAGNOSIS — I132 Hypertensive heart and chronic kidney disease with heart failure and with stage 5 chronic kidney disease, or end stage renal disease: Secondary | ICD-10-CM | POA: Diagnosis present

## 2022-09-10 DIAGNOSIS — R571 Hypovolemic shock: Secondary | ICD-10-CM | POA: Diagnosis present

## 2022-09-10 DIAGNOSIS — Z7902 Long term (current) use of antithrombotics/antiplatelets: Secondary | ICD-10-CM

## 2022-09-10 DIAGNOSIS — R64 Cachexia: Secondary | ICD-10-CM | POA: Diagnosis present

## 2022-09-10 DIAGNOSIS — E872 Acidosis, unspecified: Secondary | ICD-10-CM | POA: Diagnosis present

## 2022-09-10 DIAGNOSIS — D472 Monoclonal gammopathy: Secondary | ICD-10-CM | POA: Diagnosis present

## 2022-09-10 DIAGNOSIS — K6289 Other specified diseases of anus and rectum: Secondary | ICD-10-CM | POA: Diagnosis present

## 2022-09-10 DIAGNOSIS — Z681 Body mass index (BMI) 19 or less, adult: Secondary | ICD-10-CM | POA: Diagnosis not present

## 2022-09-10 DIAGNOSIS — I69354 Hemiplegia and hemiparesis following cerebral infarction affecting left non-dominant side: Secondary | ICD-10-CM | POA: Diagnosis not present

## 2022-09-10 DIAGNOSIS — I70229 Atherosclerosis of native arteries of extremities with rest pain, unspecified extremity: Secondary | ICD-10-CM | POA: Diagnosis present

## 2022-09-10 DIAGNOSIS — U071 COVID-19: Secondary | ICD-10-CM | POA: Diagnosis present

## 2022-09-10 DIAGNOSIS — A419 Sepsis, unspecified organism: Secondary | ICD-10-CM | POA: Diagnosis not present

## 2022-09-10 DIAGNOSIS — Z83438 Family history of other disorder of lipoprotein metabolism and other lipidemia: Secondary | ICD-10-CM

## 2022-09-10 DIAGNOSIS — Z87891 Personal history of nicotine dependence: Secondary | ICD-10-CM

## 2022-09-10 DIAGNOSIS — Z66 Do not resuscitate: Secondary | ICD-10-CM | POA: Diagnosis present

## 2022-09-10 DIAGNOSIS — R7989 Other specified abnormal findings of blood chemistry: Secondary | ICD-10-CM | POA: Diagnosis present

## 2022-09-10 DIAGNOSIS — I214 Non-ST elevation (NSTEMI) myocardial infarction: Secondary | ICD-10-CM | POA: Diagnosis present

## 2022-09-10 DIAGNOSIS — D631 Anemia in chronic kidney disease: Secondary | ICD-10-CM | POA: Diagnosis present

## 2022-09-10 DIAGNOSIS — R9431 Abnormal electrocardiogram [ECG] [EKG]: Secondary | ICD-10-CM | POA: Diagnosis present

## 2022-09-10 DIAGNOSIS — Z882 Allergy status to sulfonamides status: Secondary | ICD-10-CM

## 2022-09-10 DIAGNOSIS — I10 Essential (primary) hypertension: Secondary | ICD-10-CM | POA: Diagnosis not present

## 2022-09-10 DIAGNOSIS — N189 Chronic kidney disease, unspecified: Secondary | ICD-10-CM | POA: Diagnosis present

## 2022-09-10 DIAGNOSIS — I2510 Atherosclerotic heart disease of native coronary artery without angina pectoris: Secondary | ICD-10-CM | POA: Diagnosis present

## 2022-09-10 DIAGNOSIS — G629 Polyneuropathy, unspecified: Secondary | ICD-10-CM | POA: Diagnosis present

## 2022-09-10 DIAGNOSIS — M810 Age-related osteoporosis without current pathological fracture: Secondary | ICD-10-CM | POA: Diagnosis present

## 2022-09-10 DIAGNOSIS — R197 Diarrhea, unspecified: Secondary | ICD-10-CM | POA: Diagnosis present

## 2022-09-10 DIAGNOSIS — Z79899 Other long term (current) drug therapy: Secondary | ICD-10-CM

## 2022-09-10 DIAGNOSIS — R7401 Elevation of levels of liver transaminase levels: Secondary | ICD-10-CM

## 2022-09-10 DIAGNOSIS — Z8249 Family history of ischemic heart disease and other diseases of the circulatory system: Secondary | ICD-10-CM

## 2022-09-10 DIAGNOSIS — I959 Hypotension, unspecified: Secondary | ICD-10-CM

## 2022-09-10 DIAGNOSIS — J1282 Pneumonia due to coronavirus disease 2019: Secondary | ICD-10-CM | POA: Diagnosis present

## 2022-09-10 DIAGNOSIS — Z91048 Other nonmedicinal substance allergy status: Secondary | ICD-10-CM

## 2022-09-10 DIAGNOSIS — Z800 Family history of malignant neoplasm of digestive organs: Secondary | ICD-10-CM

## 2022-09-10 DIAGNOSIS — I252 Old myocardial infarction: Secondary | ICD-10-CM

## 2022-09-10 LAB — CBG MONITORING, ED: Glucose-Capillary: 110 mg/dL — ABNORMAL HIGH (ref 70–99)

## 2022-09-10 LAB — BLOOD GAS, VENOUS
Acid-Base Excess: 2.1 mmol/L — ABNORMAL HIGH (ref 0.0–2.0)
Bicarbonate: 28.7 mmol/L — ABNORMAL HIGH (ref 20.0–28.0)
Drawn by: 66167
O2 Saturation: 43.7 %
Patient temperature: 36.4
pCO2, Ven: 51 mmHg (ref 44–60)
pH, Ven: 7.36 (ref 7.25–7.43)
pO2, Ven: <31 mmHg — CL (ref 32–45)

## 2022-09-10 LAB — COMPREHENSIVE METABOLIC PANEL
ALT: 49 U/L — ABNORMAL HIGH (ref 0–44)
AST: 108 U/L — ABNORMAL HIGH (ref 15–41)
Albumin: 3.3 g/dL — ABNORMAL LOW (ref 3.5–5.0)
Alkaline Phosphatase: 252 U/L — ABNORMAL HIGH (ref 38–126)
Anion gap: 21 — ABNORMAL HIGH (ref 5–15)
BUN: 47 mg/dL — ABNORMAL HIGH (ref 8–23)
CO2: 24 mmol/L (ref 22–32)
Calcium: 8.2 mg/dL — ABNORMAL LOW (ref 8.9–10.3)
Chloride: 94 mmol/L — ABNORMAL LOW (ref 98–111)
Creatinine, Ser: 5.61 mg/dL — ABNORMAL HIGH (ref 0.44–1.00)
GFR, Estimated: 8 mL/min — ABNORMAL LOW (ref >60)
Glucose, Bld: 95 mg/dL (ref 70–99)
Potassium: 5.9 mmol/L — ABNORMAL HIGH (ref 3.5–5.1)
Sodium: 139 mmol/L (ref 135–145)
Total Bilirubin: 1.6 mg/dL — ABNORMAL HIGH (ref 0.3–1.2)
Total Protein: 7.5 g/dL (ref 6.5–8.1)

## 2022-09-10 LAB — TROPONIN I (HIGH SENSITIVITY)
Troponin I (High Sensitivity): 420 ng/L
Troponin I (High Sensitivity): 457 ng/L (ref <18)

## 2022-09-10 LAB — CBC WITH DIFFERENTIAL/PLATELET
Abs Immature Granulocytes: 0.05 10*3/uL (ref 0.00–0.07)
Basophils Absolute: 0 10*3/uL (ref 0.0–0.1)
Basophils Relative: 0 %
Eosinophils Absolute: 0 10*3/uL (ref 0.0–0.5)
Eosinophils Relative: 0 %
HCT: 40 % (ref 36.0–46.0)
Hemoglobin: 12.4 g/dL (ref 12.0–15.0)
Immature Granulocytes: 1 %
Lymphocytes Relative: 11 %
Lymphs Abs: 0.9 10*3/uL (ref 0.7–4.0)
MCH: 31.8 pg (ref 26.0–34.0)
MCHC: 31 g/dL (ref 30.0–36.0)
MCV: 102.6 fL — ABNORMAL HIGH (ref 80.0–100.0)
Monocytes Absolute: 1.3 10*3/uL — ABNORMAL HIGH (ref 0.1–1.0)
Monocytes Relative: 16 %
Neutro Abs: 5.7 10*3/uL (ref 1.7–7.7)
Neutrophils Relative %: 72 %
Platelets: 183 10*3/uL (ref 150–400)
RBC: 3.9 MIL/uL (ref 3.87–5.11)
RDW: 19.9 % — ABNORMAL HIGH (ref 11.5–15.5)
WBC: 8 10*3/uL (ref 4.0–10.5)
nRBC: 0 % (ref 0.0–0.2)

## 2022-09-10 LAB — I-STAT VENOUS BLOOD GAS, ED
Acid-Base Excess: 5 mmol/L — ABNORMAL HIGH (ref 0.0–2.0)
Bicarbonate: 30 mmol/L — ABNORMAL HIGH (ref 20.0–28.0)
Calcium, Ion: 0.88 mmol/L — CL (ref 1.15–1.40)
HCT: 30 % — ABNORMAL LOW (ref 36.0–46.0)
Hemoglobin: 10.2 g/dL — ABNORMAL LOW (ref 12.0–15.0)
O2 Saturation: 99 %
Potassium: 5.2 mmol/L — ABNORMAL HIGH (ref 3.5–5.1)
Sodium: 135 mmol/L (ref 135–145)
TCO2: 31 mmol/L (ref 22–32)
pCO2, Ven: 46.9 mmHg (ref 44–60)
pH, Ven: 7.415 (ref 7.25–7.43)
pO2, Ven: 158 mmHg — ABNORMAL HIGH (ref 32–45)

## 2022-09-10 LAB — GLUCOSE, CAPILLARY: Glucose-Capillary: 160 mg/dL — ABNORMAL HIGH (ref 70–99)

## 2022-09-10 LAB — APTT: aPTT: 35 seconds (ref 24–36)

## 2022-09-10 LAB — HEMOGLOBIN A1C
Hgb A1c MFr Bld: 5.5 % (ref 4.8–5.6)
Mean Plasma Glucose: 111.15 mg/dL

## 2022-09-10 LAB — SARS CORONAVIRUS 2 BY RT PCR: SARS Coronavirus 2 by RT PCR: POSITIVE — AB

## 2022-09-10 LAB — PROTIME-INR
INR: 1.8 — ABNORMAL HIGH (ref 0.8–1.2)
Prothrombin Time: 20.7 seconds — ABNORMAL HIGH (ref 11.4–15.2)

## 2022-09-10 LAB — BRAIN NATRIURETIC PEPTIDE: B Natriuretic Peptide: 3533.9 pg/mL — ABNORMAL HIGH (ref 0.0–100.0)

## 2022-09-10 LAB — LACTIC ACID, PLASMA
Lactic Acid, Venous: 4.7 mmol/L (ref 0.5–1.9)
Lactic Acid, Venous: 2.9 mmol/L (ref 0.5–1.9)

## 2022-09-10 LAB — MRSA NEXT GEN BY PCR, NASAL: MRSA by PCR Next Gen: NOT DETECTED

## 2022-09-10 LAB — PROCALCITONIN: Procalcitonin: 8.36 ng/mL

## 2022-09-10 LAB — MAGNESIUM: Magnesium: 2 mg/dL (ref 1.7–2.4)

## 2022-09-10 MED ORDER — MIDODRINE HCL 5 MG PO TABS
15.0000 mg | ORAL_TABLET | Freq: Three times a day (TID) | ORAL | Status: DC
  Administered 2022-09-10 – 2022-09-12 (×6): 15 mg via ORAL
  Filled 2022-09-10 (×6): qty 3

## 2022-09-10 MED ORDER — ATORVASTATIN CALCIUM 80 MG PO TABS
80.0000 mg | ORAL_TABLET | Freq: Every day | ORAL | Status: DC
  Administered 2022-09-11 – 2022-09-13 (×3): 80 mg via ORAL
  Filled 2022-09-10 (×4): qty 1

## 2022-09-10 MED ORDER — ALBUMIN HUMAN 25 % IV SOLN
25.0000 g | Freq: Once | INTRAVENOUS | Status: AC
  Administered 2022-09-11: 25 g via INTRAVENOUS
  Filled 2022-09-10: qty 100

## 2022-09-10 MED ORDER — LACTATED RINGERS IV BOLUS: 250.0000 mL | Freq: Once | INTRAVENOUS | Status: DC

## 2022-09-10 MED ORDER — CLOPIDOGREL BISULFATE 75 MG PO TABS
75.0000 mg | ORAL_TABLET | Freq: Every day | ORAL | Status: DC
  Administered 2022-09-11 – 2022-09-17 (×7): 75 mg via ORAL
  Filled 2022-09-10 (×7): qty 1

## 2022-09-10 MED ORDER — PANTOPRAZOLE SODIUM 40 MG PO TBEC
40.0000 mg | DELAYED_RELEASE_TABLET | Freq: Every day | ORAL | Status: DC
  Administered 2022-09-11 – 2022-09-17 (×7): 40 mg via ORAL
  Filled 2022-09-10 (×7): qty 1

## 2022-09-10 MED ORDER — SODIUM CHLORIDE 0.9 % IV SOLN: 2.0000 g | Freq: Once | INTRAVENOUS | Status: DC

## 2022-09-10 MED ORDER — SODIUM CHLORIDE 0.9 % IV SOLN
1.0000 g | INTRAVENOUS | Status: DC
  Administered 2022-09-11 – 2022-09-14 (×4): 1 g via INTRAVENOUS
  Filled 2022-09-10 (×4): qty 10

## 2022-09-10 MED ORDER — ONDANSETRON HCL 4 MG/2ML IJ SOLN: 4.0000 mg | Freq: Three times a day (TID) | INTRAMUSCULAR | Status: DC | PRN

## 2022-09-10 MED ORDER — LACTATED RINGERS IV BOLUS
900.0000 mL | Freq: Once | INTRAVENOUS | Status: AC
  Administered 2022-09-10: 900 mL via INTRAVENOUS

## 2022-09-10 MED ORDER — LACTATED RINGERS IV BOLUS (SEPSIS)
500.0000 mL | Freq: Once | INTRAVENOUS | Status: AC
  Administered 2022-09-10: 500 mL via INTRAVENOUS

## 2022-09-10 MED ORDER — MIDODRINE HCL 5 MG PO TABS: 10.0000 mg | ORAL_TABLET | Freq: Three times a day (TID) | ORAL | Status: DC

## 2022-09-10 MED ORDER — VANCOMYCIN HCL IN DEXTROSE 1-5 GM/200ML-% IV SOLN: 1000.0000 mg | Freq: Once | INTRAVENOUS | Status: DC

## 2022-09-10 MED ORDER — SODIUM ZIRCONIUM CYCLOSILICATE 10 G PO PACK
10.0000 g | PACK | Freq: Once | ORAL | Status: AC
  Administered 2022-09-10: 10 g via ORAL
  Filled 2022-09-10: qty 1

## 2022-09-10 MED ORDER — ALBUTEROL SULFATE HFA 108 (90 BASE) MCG/ACT IN AERS: 1.0000 | INHALATION_SPRAY | Freq: Four times a day (QID) | RESPIRATORY_TRACT | Status: DC | PRN

## 2022-09-10 MED ORDER — ACETAMINOPHEN 500 MG PO TABS
1000.0000 mg | ORAL_TABLET | Freq: Once | ORAL | Status: AC
  Administered 2022-09-10: 1000 mg via ORAL
  Filled 2022-09-10: qty 2

## 2022-09-10 MED ORDER — SODIUM CHLORIDE 0.9 % IV SOLN
2.0000 g | Freq: Once | INTRAVENOUS | Status: DC
  Administered 2022-09-10: 2 g via INTRAVENOUS
  Filled 2022-09-10: qty 12.5

## 2022-09-10 MED ORDER — DEXAMETHASONE 6 MG PO TABS
6.0000 mg | ORAL_TABLET | Freq: Every day | ORAL | Status: DC
  Administered 2022-09-10 – 2022-09-17 (×8): 6 mg via ORAL
  Filled 2022-09-10: qty 1
  Filled 2022-09-10 (×2): qty 2
  Filled 2022-09-10 (×4): qty 1
  Filled 2022-09-10: qty 2

## 2022-09-10 MED ORDER — CHLORHEXIDINE GLUCONATE CLOTH 2 % EX PADS
6.0000 | MEDICATED_PAD | Freq: Every day | CUTANEOUS | Status: DC
  Administered 2022-09-11 – 2022-09-15 (×4): 6 via TOPICAL

## 2022-09-10 MED ORDER — SODIUM CHLORIDE 0.9 % IV SOLN: 250.0000 mL | INTRAVENOUS | Status: DC

## 2022-09-10 MED ORDER — VANCOMYCIN HCL 500 MG/100ML IV SOLN
500.0000 mg | INTRAVENOUS | Status: DC
  Filled 2022-09-10: qty 100

## 2022-09-10 MED ORDER — IOHEXOL 350 MG/ML SOLN
75.0000 mL | Freq: Once | INTRAVENOUS | Status: AC | PRN
  Administered 2022-09-10: 75 mL via INTRAVENOUS

## 2022-09-10 MED ORDER — NOREPINEPHRINE 4 MG/250ML-% IV SOLN
2.0000 ug/min | INTRAVENOUS | Status: DC
  Administered 2022-09-10 – 2022-09-11 (×2): 2 ug/min via INTRAVENOUS
  Filled 2022-09-10 (×3): qty 250

## 2022-09-10 MED ORDER — CHLORHEXIDINE GLUCONATE CLOTH 2 % EX PADS: 6.0000 | MEDICATED_PAD | Freq: Every day | CUTANEOUS | Status: DC

## 2022-09-10 MED ORDER — SODIUM CHLORIDE 0.9 % IV BOLUS: 500.0000 mL | Freq: Once | INTRAVENOUS | Status: DC

## 2022-09-10 MED ORDER — INSULIN ASPART 100 UNIT/ML IJ SOLN
0.0000 [IU] | INTRAMUSCULAR | Status: DC
  Administered 2022-09-11: 2 [IU] via SUBCUTANEOUS
  Administered 2022-09-11: 3 [IU] via SUBCUTANEOUS
  Administered 2022-09-11: 2 [IU] via SUBCUTANEOUS
  Administered 2022-09-11: 1 [IU] via SUBCUTANEOUS
  Administered 2022-09-11 – 2022-09-12 (×3): 2 [IU] via SUBCUTANEOUS
  Administered 2022-09-12: 1 [IU] via SUBCUTANEOUS
  Administered 2022-09-12: 2 [IU] via SUBCUTANEOUS

## 2022-09-10 MED ORDER — SEVELAMER CARBONATE 800 MG PO TABS
800.0000 mg | ORAL_TABLET | Freq: Three times a day (TID) | ORAL | Status: DC
  Administered 2022-09-11 – 2022-09-17 (×16): 800 mg via ORAL
  Filled 2022-09-10 (×17): qty 1

## 2022-09-10 MED ORDER — LACTATED RINGERS IV BOLUS
500.0000 mL | Freq: Once | INTRAVENOUS | Status: DC
  Administered 2022-09-10: 500 mL via INTRAVENOUS

## 2022-09-10 MED ORDER — METRONIDAZOLE 500 MG/100ML IV SOLN
500.0000 mg | Freq: Once | INTRAVENOUS | Status: AC
  Administered 2022-09-10: 500 mg via INTRAVENOUS
  Filled 2022-09-10: qty 100

## 2022-09-10 NOTE — Consult Note (Addendum)
[facility] Nephrology Consult Note: Reason for Consult: To manage dialysis and dialysis related needs Referring Physician: Dr. Rogers Blocker, Ebony Hail  HPI:  [name] is an 71 y.o. 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 Her last outpatient dialysis was yesterday for about 2 hours only.  It seems like she had 2 episodes of vomiting and not feeling well at SNF therefore brought to the ER.  In the emergency her initial blood pressure was 88 systolic.  The temperature was 100.8, in room air.  The labs showed potassium 5.9, BUN 47, lactic acid level 4.7, WBC 8.0, hemoglobin 12.4, INR 1.8.  COVID-19 was positive.  Chest x-ray with no acute finding no evidence of pneumonia or pulmonary edema. Sepsis protocol was activated in ER.  The blood culture was sent.  Started empiric antibiotics with vancomycin, cefepime and Flagyl.  The blood pressure improved after 500 cc of LR. She is currently alert awake but somnolent.  She denies chest pain, shortness of breath, nausea or vomiting.  Feels weak.  OP HD orders:  Dialyzes at [facility], MWF, 4 hours, 2k 2ca, edw 44.1kg Access; RUE AVG Mircera 30 mcg on 8/11, last Hb 11.3 Hectorol 5 mcg w/each HD Sensipar 30 mg with HD.  Sevelamer 800 mg 2 tabs with each meal.    Past Medical History:  Diagnosis Date   Acute ischemic stroke ([place]) 02/23/2017   Acute on chronic diastolic CHF (congestive heart failure) ([place])    Adenomatous polyp of colon 10/2010, 2006, 2015   Anemia in CKD (chronic kidney disease) 11/07/2012   s/p blood transfusion.    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  Had some early rejection then stable function for years, then had slow decline of function and went back on hemodialysis in 2012.  Gets HD TTS schedule at [facility] on [facility] still using L forearm AVF.      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    Past Surgical History:  Procedure Laterality Date   ABDOMINAL AORTOGRAM W/LOWER EXTREMITY N/A 03/26/2022   Procedure: ABDOMINAL AORTOGRAM W/LOWER EXTREMITY;  Surgeon: Marty Heck, MD;  Location: [place] [facility];  Service: Cardiovascular;  Laterality: N/A;  Left   AV FISTULA PLACEMENT     for dialysis   AV FISTULA PLACEMENT Left 11/22/2015   Procedure: ARTERIOVENOUS (AV) FISTULA CREATION-LEFT BRACHIOCEPHALIC;  Surgeon: Serafina Mitchell, MD;  Location: [place] [facility];  Service: Vascular;  Laterality: Left;   AV FISTULA PLACEMENT Right 03/15/2020   Procedure: INSERTION OF ARTERIOVENOUS (AV) GORE-TEX GRAFT ARM ( BRACHIAL AXILLARY );  Surgeon: Katha Cabal, MD;  Location: [facility];  Service: Vascular;  Laterality: Right;  AV FISTULA PLACEMENT Right 05/15/2022   Procedure: EXPLORATION  OF  RIGHT ARM ARTERIOVENOUS GORE-TEX GRAFT;  Surgeon: Angelia Mould, MD;  Location: [place];  Service: Vascular;  Laterality: Right;   [place] REMOVAL Right 04/27/2022   Procedure: REVISION OF RIGHT ARTERIOVENOUS GRAFT AND EXCISION OF RIGHT DEGENERATED GRAFT (AVG);  Surgeon: Angelia Mould, MD;  Location: [place];  Service: Vascular;  Laterality: Right;   BACK SURGERY     CERVICAL FUSION     CHOLECYSTECTOMY  12/02/2012   Procedure: LAPAROSCOPIC CHOLECYSTECTOMY WITH  INTRAOPERATIVE CHOLANGIOGRAM;  Surgeon: Edward Jolly, MD;  Location: [place];  Service: General;  Laterality: N/A;   EYE SURGERY Bilateral    cataract surgery   EYE SURGERY Left 2019   laser   HEMATOMA EVACUATION Left 12/24/2016   Procedure: EVACUATION HEMATOMA LEFT UPPER ARM;  Surgeon: Waynetta Sandy, MD;  Location: [place];  Service: Vascular;  Laterality: Left;   I & D EXTREMITY Left 12/31/2016   Procedure: IRRIGATION AND DEBRIDEMENT EXTREMITY;  Surgeon: Angelia Mould, MD;  Location: [place];  Service: Vascular;  Laterality: Left;   INSERTION OF DIALYSIS CATHETER Right 12/24/2016   Procedure: INSERTION OF DIALYSIS CATHETER;  Surgeon: Waynetta Sandy, MD;  Location: [place];  Service: Vascular;  Laterality: Right;   INSERTION OF DIALYSIS CATHETER Right 02/04/2017   Procedure: INSERTION OF DIALYSIS CATHETER;  Surgeon: Waynetta Sandy, MD;  Location: [place];  Service: Vascular;  Laterality: Right;   [place] Left 10/23/2016   Procedure: Fistulagram;  Surgeon: Elam [place], MD;  Location: [place] [facility];  Service: Cardiovascular;  Laterality: Left;   PERIPHERAL VASCULAR INTERVENTION  03/26/2022   Procedure: PERIPHERAL VASCULAR INTERVENTION;  Surgeon: Marty Heck, MD;  Location: [place] [facility];  Service: Cardiovascular;;  left sfa   PERIPHERAL VASCULAR THROMBECTOMY Right 04/16/2020   Procedure: PERIPHERAL VASCULAR THROMBECTOMY;  Surgeon: Katha Cabal, MD;  Location: [place] [facility];  Service: Cardiovascular;  Laterality: Right;   RESECTION OF ARTERIOVENOUS FISTULA ANEURYSM Left 11/22/2015   Procedure: RESECTION OF LEFT RADIOCEPHALIC FISTULA ANEURYSM ;  Surgeon: Serafina Mitchell, MD;  Location: [place] [facility];  Service: Vascular;  Laterality: Left;   REVISON OF ARTERIOVENOUS FISTULA Left 12/22/2016   Procedure: REVISON OF LEFT ARTERIOVENOUS FISTULA;  Surgeon: Waynetta Sandy, MD;  Location:  [place];  Service: Vascular;  Laterality: Left;   REVISON OF ARTERIOVENOUS FISTULA Left 02/04/2017   Procedure: REVISON OF LEFT UPPER ARM ARTERIOVENOUS FISTULA;  Surgeon: Waynetta Sandy, MD;  Location: [place];  Service: Vascular;  Laterality: Left;   UPPER EXTREMITY ANGIOGRAPHY Bilateral 09/19/2019   Procedure: UPPER EXTREMITY ANGIOGRAPHY;  Surgeon: Katha Cabal, MD;  Location: [place] [facility];  Service: Cardiovascular;  Laterality: Bilateral;    Family History  Problem Relation Age of Onset   Colon cancer Brother    Cancer Brother    Coronary artery disease Mother 52   Hyperlipidemia Mother    Hypertension Mother    Stroke Maternal Aunt    Esophageal cancer Neg Hx    Stomach cancer Neg Hx    Rectal cancer Neg Hx     Social History:  reports that she quit smoking about 30 years ago. Her smoking use included cigarettes. She has never been exposed to tobacco smoke. She has never used smokeless tobacco. She reports that she does not drink alcohol and does not use drugs.  Allergies:  Allergies  Allergen Reactions   Sulfa Antibiotics Other (See Comments)    Per patient, both parents allergic-so will not take   Adhesive [Tape] Itching    Medications: I have reviewed the patient's current medications.   Results for orders placed or performed during the hospital encounter of 09/10/22 (from the past 48 hour(s))  SARS Coronavirus 2 by RT PCR (hospital order, performed in [facility] hospital lab) *cepheid single result test* Anterior Nasal Swab     Status: Abnormal   Collection Time: 09/10/22  1:17 PM   Specimen: Anterior Nasal Swab  Result Value Ref Range   SARS Coronavirus 2 by RT PCR POSITIVE (A) NEGATIVE    Comment: (NOTE) SARS-CoV-2 target nucleic acids are DETECTED  SARS-CoV-2 RNA is generally detectable in upper respiratory specimens  during the acute phase of infection.  Positive results are indicative  of the presence of the identified virus, but do not rule  out bacterial infection or co-infection with other pathogens not detected by the test.  Clinical correlation with patient history and  other diagnostic information is necessary to determine patient infection status.  The expected result is negative.  Fact Sheet for Patients:   https://www.patel.info/   Fact Sheet for Healthcare Providers:   https://hall.com/    This test is not yet approved or cleared by the [place] FDA and  has been authorized for detection and/or diagnosis of SARS-CoV-2 by FDA under an Emergency Use Authorization (EUA).  This EUA will remain in effect (meaning this test can be used) for the duration of  the COVID-19 declaration under Section 564(b)(1)  of the Act, 21 U.S.C. section 360-bbb-3(b)(1), unless the authorization is terminated or revoked sooner.   Performed at [facility], [place] [address] [place], [place] [address]   Comprehensive metabolic panel     Status: Abnormal   Collection Time: 09/10/22  1:53 PM  Result Value Ref Range   Sodium 139 135 - 145 mmol/L   Potassium 5.9 (H) 3.5 - 5.1 mmol/L   Chloride 94 (L) 98 - 111 mmol/L   CO2 24 22 - 32 mmol/L   Glucose, Bld 95 70 - 99 mg/dL    Comment: Glucose reference range applies only to samples taken after fasting for at least 8 hours.   BUN 47 (H) 8 - 23 mg/dL   Creatinine, Ser 5.61 (H) 0.44 - 1.00 mg/dL   Calcium 8.2 (L) 8.9 - 10.3 mg/dL   Total Protein 7.5 6.5 - 8.1 g/dL   Albumin 3.3 (L) 3.5 - 5.0 g/dL   AST 108 (H) 15 - 41 U/L   ALT 49 (H) 0 - 44 U/L   Alkaline Phosphatase 252 (H) 38 - 126 U/L   Total Bilirubin 1.6 (H) 0.3 - 1.2 mg/dL   GFR, Estimated 8 (L) >60 mL/min    Comment: (NOTE) Calculated using the CKD-EPI Creatinine Equation (2021)    Anion gap 21 (H) 5 - 15    Comment: Performed at [facility], [place] [address], [place], [place] [address]  CBC with Differential     Status: Abnormal   Collection Time: 09/10/22  1:53 PM   Result Value Ref Range   WBC 8.0 4.0 - 10.5 K/uL   RBC 3.90 3.87 - 5.11 MIL/uL   Hemoglobin 12.4 12.0 - 15.0 g/dL   HCT 40.0 36.0 - 46.0 %   MCV 102.6 (H) 80.0 - 100.0 fL   MCH 31.8 26.0 - 34.0 pg   MCHC 31.0  30.0 - 36.0 g/dL   RDW 19.9 (H) 11.5 - 15.5 %   Platelets 183 150 - 400 K/uL   nRBC 0.0 0.0 - 0.2 %   Neutrophils Relative % 72 %   Neutro Abs 5.7 1.7 - 7.7 K/uL   Lymphocytes Relative 11 %   Lymphs Abs 0.9 0.7 - 4.0 K/uL   Monocytes Relative 16 %   Monocytes Absolute 1.3 (H) 0.1 - 1.0 K/uL   Eosinophils Relative 0 %   Eosinophils Absolute 0.0 0.0 - 0.5 K/uL   Basophils Relative 0 %   Basophils Absolute 0.0 0.0 - 0.1 K/uL   Immature Granulocytes 1 %   Abs Immature Granulocytes 0.05 0.00 - 0.07 K/uL    Comment: Performed at [facility] [address], [place], [place] [address]  Protime-INR     Status: Abnormal   Collection Time: 09/10/22  1:53 PM  Result Value Ref Range   Prothrombin Time 20.7 (H) 11.4 - 15.2 seconds   INR 1.8 (H) 0.8 - 1.2    Comment: (NOTE) INR goal varies based on device and disease states. Performed at [facility], [place] [address], [place], [place] [address]   APTT     Status: None   Collection Time: 09/10/22  1:53 PM  Result Value Ref Range   aPTT 35 24 - 36 seconds    Comment: Performed at [facility] [address] [place], [place] [address]  Lactic acid, plasma     Status: Abnormal   Collection Time: 09/10/22  1:57 PM  Result Value Ref Range   Lactic Acid, Venous 4.7 (HH) 0.5 - 1.9 mmol/L    Comment: CRITICAL RESULT CALLED TO, READ BACK BY AND VERIFIED WITH P FURROW RN 09/10/2022 1553 B NUNNERY Performed at [facility], [place] [address], [place], [place] [address]     DG Chest Portable 1 View  Result Date: 09/10/2022 CLINICAL DATA:  Weakness/hypotension, onset after dialysis yesterday. Two episodes of emesis yesterday. EXAM: PORTABLE CHEST 1 VIEW COMPARISON:  Chest x-rays dated 09/07/2022, 08/19/2022 and  02/02/2022. FINDINGS: Stable cardiomegaly. Lungs are clear. No pleural effusion or pneumothorax is seen. Visualized osseous structures about the chest are unremarkable. IMPRESSION: No active disease. No evidence of pneumonia or pulmonary edema. Stable cardiomegaly. Electronically Signed   By: Franki Cabot M.D.   On: 09/10/2022 13:36    ROS: As per H&P.  Rest of the systems reviewed and are negative. Blood pressure (!) 124/37, pulse 85, temperature (!) 100.8 F (38.2 C), temperature source Rectal, resp. rate (!) 22, height '5\' 3"'$  (1.6 m), weight 46.1 kg, SpO2 97 %. Gen: NAD, comfortable, quite somnolent. Respiratory: Clear bilateral, no wheezing or crackle Cardiovascular: Regular rate rhythm S1-S2 normal, no rubs GI: Abdomen soft, nontender, nondistended Extremities, no cyanosis or clubbing, no edema Skin: No rash or ulcer Neurology: Alert, awake, so malignant Dialysis Access: Right upper extremity AV graft has good thrill and bruit, no tenderness or redness.  Assessment/Plan:  #Sepsis in the setting of COVID-19 positive: Chest x-ray with no acute finding.  Received vancomycin, cefepime and Flagyl.  Pending blood culture result. Primary team getting CT chest.  # ESRD: MWF.  Last treatment yesterday, she did not complete full treatment.  She has some chronic hyperkalemia, volume acceptable.  We will plan for next HD tomorrow.  AV graft looks clear, without any sign of infection.  #Chronic hyperkalemia in ESRD patient: Order Lokelma and manage medically tonight.  Plan for  HD tomorrow.  # Hypotension: BP improved with LR 500 cc.  Noted midodrine as home medication, we will resume. Please avoid further IV fluid in this ESRD patient.   # Anemia of ESRD: Hemoglobin above goal.  Last dose of ESA back in August.  # Metabolic Bone Disease/hyperphosphatemia: Resume sevelamer.  Monitor lab.  Thank you for the consult.  We will follow with you.  Mackson Botz Tanna Furry 09/10/2022, 4:55 PM

## 2022-09-10 NOTE — Progress Notes (Signed)
Pharmacy Antibiotic Note  [name] is a 71 y.o. female admitted on 09/10/2022 with sepsis.  Pharmacy has been consulted for Cefepime and vancomycin dosing. Patient had complaints of weakness/hypotension that began after dialysis on Wednesday. Dialysis schedule is MWF. O2 86% on RA improved with [place] to 96%. Fever 100.8, cultures and COVID swab pending.  Plan: Vancomycin '1000mg'$  x1dose (46kg*0.9*25) Vancomycin '500mg'$  IV after each dialysis session Cefepime 1g q24h Flagyl '500mg'$  x1 dose per MD Monitor signs of clinical improvement, fever curve, and cultures  Height: '5\' 3"'$  (160 cm) Weight: 46.1 kg (101 lb 10.1 oz) IBW/kg (Calculated) : 52.4  Temp (24hrs), Avg:99.1 F (37.3 C), Min:97.3 F (36.3 C), Max:100.8 F (38.2 C)  Recent Labs  Lab 09/07/22 0407  WBC 5.8  CREATININE 5.78*    Estimated Creatinine Clearance: 6.4 mL/min (A) (by C-G formula based on SCr of 5.78 mg/dL (H)).    Allergies  Allergen Reactions   Sulfa Antibiotics Other (See Comments)    Per patient, both parents allergic-so will not take   Adhesive [Tape] Itching    Antimicrobials this admission: cefepime 9/28 >>  flagyl 9/28 >> 9/28 Vancomycin 9/28 >>  Microbiology results: 9/28 BCx: pending 9/28 MRSA PCR: pending  Thank you for allowing pharmacy to be a part of this patient's care.  Sandford Craze, PharmD. [facility] Acute Care PGY-1  09/10/2022 2:20 PM

## 2022-09-10 NOTE — Sepsis Progress Note (Signed)
Notified bedside nurse of need to draw and administer antibiotics, lactic acid, and blood cultures.  

## 2022-09-10 NOTE — Consult Note (Signed)
History and Physical    Patient: [name] BZJ:[id number] DOB: 21-Jul-1951 DOA: 09/10/2022 DOS: the patient was seen and examined on 09/10/2022 PCP: [name], MD  Patient coming from: Home - lives with mother. Uses walker and cane.    Chief Complaint: weakness/hypotension   HPI: [name] is a 71 y.o. female with medical history significant of  CVA; chronic diastolic CHF; CAD; ESRD on MWF HD; HTN; HLD; and PVD presenting to ED with complaints of weakness. History from chart review. Patient would not wake up enough and attempted to call sister who did not answer. She called EMS due to weakness x 1 day. When Ems arrived she was found to be hypotensive and febrile. Per notes vomited 2x yesterday and was seen in ED 3 days ago for diarrhea.   Hospitalized on 5/14-5/22 for septic shock due to staph bacteremia from infected AV graft. She was then hospitalized on 5/24 for NSTEMI and fluid overload. Cardiology recommended medical management and palliative consult. Hospital stay stay complicated by wound dehiscence adjacent to L arm graft, now s/p exploration of L arm graft and evacuation of hematoma by vascular on 6/2.  She was on cefepime and vancomycin for MSSA bacteremia and left arm graft wound.  However, she developed C. Difficile, and antibiotics de-escalated to Ancef.  She was started on oral vancomycin for C. difficile on 05/06/2022.  ID recommended oral vancomycin for a total of 14 days followed by twice daily for 7 days after completion of IV Ancef which would be on 05/26/2022.  She is discharged on IV Ancef with dialysis through 05/26/2022.  She will be on p.o. doxycycline 100 mg twice daily after completing IV Ancef for suppressive therapy.  She does not smoke or drink.   ER Course:  vitals: temp: 100.8, bp: 110/33, HR: 71, RR: 14, oxygen: 94% on 4L Pertinent labs: COVID positive, potassium: 5.9, BUN: 47, creatinine: 5.61, AST: 108, AL: 49, AP: 252, lactic acid: 4.7,  CXR: no  evidence of acute disease or edema  In ED: code sepsis activated. Started on vanc/cefepime. Nephrology consulted and [facility] asked to admit. Given 551m bolus.    Review of Systems: Unable to review all systems due to lack of cooperation from patient. Past Medical History:  Diagnosis Date   Acute ischemic stroke ([facility] 02/23/2017   Acute on chronic diastolic CHF (congestive heart failure) ([facility]    Adenomatous polyp of colon 10/2010, 2006, 2015   Anemia in CKD (chronic kidney disease) 11/07/2012   s/p blood transfusion.    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  Had some early rejection then stable function for years, then had slow decline of function and went back on hemodialysis in 2012.  [facility]      GERD (gastroesophageal reflux disease)    GI bleed 2017   felt to be ischemic colitis, last colo 2015   Hyperlipidemia    Hypertension    Neuromuscular disorder (HCC)    neuropathy hand and legs   Osteoporosis    Pseudoaneurysm of surgical AV fistula ([facility]  left upper arm   Past Surgical History:  Procedure Laterality Date   ABDOMINAL AORTOGRAM W/LOWER EXTREMITY N/A 03/26/2022   Procedure: ABDOMINAL AORTOGRAM W/LOWER EXTREMITY;  Surgeon: Marty Heck, MD;  Location: [place] [facility];  Service: Cardiovascular;  Laterality: N/A;  Left   AV FISTULA PLACEMENT     for dialysis   AV FISTULA PLACEMENT Left 11/22/2015   Procedure: ARTERIOVENOUS (AV) FISTULA CREATION-LEFT BRACHIOCEPHALIC;  Surgeon: Serafina Mitchell, MD;  Location: [place];  Service: Vascular;  Laterality: Left;   AV FISTULA PLACEMENT  Right 03/15/2020   Procedure: INSERTION OF ARTERIOVENOUS (AV) GORE-TEX GRAFT ARM ( BRACHIAL AXILLARY );  Surgeon: Katha Cabal, MD;  Location: [facility];  Service: Vascular;  Laterality: Right;   AV FISTULA PLACEMENT Right 05/15/2022   Procedure: EXPLORATION  OF  RIGHT ARM ARTERIOVENOUS GORE-TEX GRAFT;  Surgeon: Angelia Mould, MD;  Location: [place];  Service: Vascular;  Laterality: Right;   [place] REMOVAL Right 04/27/2022   Procedure: REVISION OF RIGHT ARTERIOVENOUS GRAFT AND EXCISION OF RIGHT DEGENERATED GRAFT (AVG);  Surgeon: Angelia Mould, MD;  Location: [place];  Service: Vascular;  Laterality: Right;   BACK SURGERY     CERVICAL FUSION     CHOLECYSTECTOMY  12/02/2012   Procedure: LAPAROSCOPIC CHOLECYSTECTOMY WITH INTRAOPERATIVE CHOLANGIOGRAM;  Surgeon: Edward Jolly, MD;  Location: [place];  Service: General;  Laterality: N/A;   EYE SURGERY Bilateral    cataract surgery   EYE SURGERY Left 2019   laser   HEMATOMA EVACUATION Left 12/24/2016   Procedure: EVACUATION HEMATOMA LEFT UPPER ARM;  Surgeon: Waynetta Sandy, MD;  Location: [place];  Service: Vascular;  Laterality: Left;   I & D EXTREMITY Left 12/31/2016   Procedure: IRRIGATION AND DEBRIDEMENT EXTREMITY;  Surgeon: Angelia Mould, MD;  Location: [place];  Service: Vascular;  Laterality: Left;   INSERTION OF DIALYSIS CATHETER Right 12/24/2016   Procedure: INSERTION OF DIALYSIS CATHETER;  Surgeon: Waynetta Sandy, MD;  Location: [place];  Service: Vascular;  Laterality: Right;   INSERTION OF DIALYSIS CATHETER Right 02/04/2017   Procedure: INSERTION OF DIALYSIS CATHETER;  Surgeon: Waynetta Sandy, MD;  Location: [place];  Service: Vascular;  Laterality: Right;   [place] Left 10/23/2016   Procedure: Fistulagram;  Surgeon: Elam Dutch, MD;  Location: [place] [facility];  Service: Cardiovascular;  Laterality: Left;   PERIPHERAL VASCULAR  INTERVENTION  03/26/2022   Procedure: PERIPHERAL VASCULAR INTERVENTION;  Surgeon: Marty Heck, MD;  Location: [place] [facility];  Service: Cardiovascular;;  left sfa   PERIPHERAL VASCULAR THROMBECTOMY Right 04/16/2020   Procedure: PERIPHERAL VASCULAR THROMBECTOMY;  Surgeon: Katha Cabal, MD;  Location: [place] [facility];  Service: Cardiovascular;  Laterality: Right;   RESECTION OF ARTERIOVENOUS FISTULA ANEURYSM Left 11/22/2015   Procedure: RESECTION OF LEFT RADIOCEPHALIC FISTULA ANEURYSM ;  Surgeon: Serafina Mitchell, MD;  Location: [place] [facility];  Service: Vascular;  Laterality: Left;   REVISON OF ARTERIOVENOUS FISTULA Left 12/22/2016   Procedure: REVISON OF LEFT ARTERIOVENOUS FISTULA;  Surgeon: Waynetta Sandy, MD;  Location: [place];  Service: Vascular;  Laterality: Left;   REVISON OF ARTERIOVENOUS FISTULA Left 02/04/2017   Procedure: REVISON OF LEFT UPPER ARM ARTERIOVENOUS FISTULA;  Surgeon: Waynetta Sandy, MD;  Location: [place];  Service: Vascular;  Laterality: Left;   UPPER EXTREMITY ANGIOGRAPHY Bilateral 09/19/2019   Procedure: UPPER EXTREMITY ANGIOGRAPHY;  Surgeon: Katha Cabal, MD;  Location: [place] [facility];  Service: Cardiovascular;  Laterality: Bilateral;   Social History:  reports that she quit smoking about 30 years ago. Her smoking use included cigarettes. She has never been exposed to tobacco smoke. She has never used smokeless tobacco. She reports that she does not drink alcohol and does not use drugs.  Allergies  Allergen Reactions   Sulfa Antibiotics Other (See Comments)    Per patient, both parents allergic-so will not take   Adhesive [Tape] Itching    Family History  Problem Relation Age of Onset   Colon cancer Brother    Cancer Brother    Coronary artery disease Mother 72   Hyperlipidemia Mother    Hypertension Mother    Stroke Maternal Aunt    Esophageal cancer Neg Hx    Stomach cancer Neg Hx    Rectal cancer Neg Hx     Prior to  Admission medications   Medication Sig Start Date End Date Taking? Authorizing Provider  acetaminophen (TYLENOL) 500 MG tablet Take 1 tablet (500 mg total) by mouth every 6 (six) hours as needed. Patient taking differently: Take 500 mg by mouth every 6 (six) hours as needed (pain). 09/05/18   Law, Bea Graff, PA-C  albuterol (PROVENTIL) (2.5 MG/3ML) 0.083% nebulizer solution Take 3 mLs (2.5 mg total) by nebulization every 4 (four) hours as needed for wheezing. Patient taking differently: Take 2.5 mg by nebulization every 6 (six) hours as needed for wheezing. 05/04/22   Elgergawy, Silver Huguenin, MD  albuterol (VENTOLIN HFA) 108 (90 Base) MCG/ACT inhaler Inhale 1-2 puffs into the lungs every 6 (six) hours as needed for wheezing or shortness of breath. Patient taking differently: Inhale 2 puffs into the lungs every 6 (six) hours as needed for shortness of breath. 01/11/22   Mesner, Corene Cornea, MD  atorvastatin (LIPITOR) 80 MG tablet Take 80 mg by mouth at bedtime. 03/25/22   [provider]  B Complex-C-Folic Acid (DIALYVITE 956) 0.8 MG WAFR Take 1 tablet by mouth every morning. 01/28/22   [provider]  clopidogrel (PLAVIX) 75 MG tablet Take 1 tablet (75 mg total) by mouth daily. Patient taking differently: Take 75 mg by mouth at bedtime. 03/26/22 03/26/23  Marty Heck, MD  doxycycline (VIBRA-TABS) 100 MG tablet Take 1 tablet (100 mg total) by mouth 2 (two) times daily. 07/02/22   Laurice Record, MD  escitalopram (LEXAPRO) 10 MG tablet Take 10 mg by mouth every morning. 03/25/22   [provider]  hydrOXYzine (ATARAX) 25 MG tablet Take 1 tablet (25 mg total) by mouth every 8 (eight) hours as needed for itching. 05/21/22   Mercy Riding, MD  loperamide (IMODIUM) 2 MG capsule Take 1 capsule (2 mg total) by mouth 4 (four) times daily as needed for diarrhea or loose stools. 09/07/22   Redwine, Madison A, PA-C  midodrine (PROAMATINE) 10 MG tablet Take 1 tablet (10 mg total) by mouth 3  (three) times daily with meals. 05/21/22   Mercy Riding, MD  Nutritional Supplements (NEPRO) LIQD Take 237 mLs by mouth 2 (two) times daily between meals.    [provider]  ondansetron (ZOFRAN) 8 MG tablet Take 8 mg by mouth every 12 (twelve) hours as needed for nausea or vomiting.    [provider]  OVER THE COUNTER MEDICATION Take 1 tablet by mouth every morning. Folic acid - vitamin B complex - vitamin C - selenium - zinc 3 mg    [provider]  pantoprazole (  PROTONIX) 40 MG tablet Take 1 tablet (40 mg total) by mouth daily. 08/18/21   Nita Sells, MD  sevelamer (RENAGEL) 800 MG tablet Take 1,600 mg by mouth 3 (three) times daily with meals. 03/24/22   [provider]  zolpidem (AMBIEN) 5 MG tablet Take 1 tablet (5 mg total) by mouth at bedtime as needed for up to 10 days for sleep (Insomnia). 05/21/22 05/31/22  Mercy Riding, MD    Physical Exam: Vitals:   09/10/22 1630 09/10/22 1730 09/10/22 1830 09/10/22 1915  BP: (!) 108/49 (!) 98/41 (!) 82/48 (!) 80/42  Pulse: 92 89 88 76  Resp: '11 10 10 11  '$ Temp:      TempSrc:      SpO2: 99% 98% 98% 96%  Weight:      Height:       General:  Appears calm and comfortable and is in NAD. Sleeping, arouses to sternal rub.  Eyes:  PERRL, EOMI, normal lids, iris ENT:  grossly normal hearing, lips & tongue, mmm; appropriate dentition Neck:  no LAD, masses or thyromegaly; no carotid bruits Cardiovascular:  RRR, +systolic murmur.  No LE edema.  Respiratory:   CTA bilaterally with no wheezes/rales/rhonchi.  Normal respiratory effort. Abdomen:  soft, NT, ND, NABS Back:   normal alignment, no CVAT Skin:  no rash or induration seen on limited exam. Fistula on left and right arm Musculoskeletal:  grossly normal tone BUE/BLE, good ROM, no bony abnormality Lower extremity:  No LE edema.  Limited foot exam with no ulcerations.  2+ distal pulses. Psychiatric:  obtunded. Arouses to sternal rub.  Neurologic:  no obvious  focal deficits.    Radiological Exams on Admission: Independently reviewed - see discussion in A/P where applicable  DG Chest Portable 1 View  Result Date: 09/10/2022 CLINICAL DATA:  Weakness/hypotension, onset after dialysis yesterday. Two episodes of emesis yesterday. EXAM: PORTABLE CHEST 1 VIEW COMPARISON:  Chest x-rays dated 09/07/2022, 08/19/2022 and 02/02/2022. FINDINGS: Stable cardiomegaly. Lungs are clear. No pleural effusion or pneumothorax is seen. Visualized osseous structures about the chest are unremarkable. IMPRESSION: No active disease. No evidence of pneumonia or pulmonary edema. Stable cardiomegaly. Electronically Signed   By: Franki Cabot M.D.   On: 09/10/2022 13:36    EKG: Independently reviewed.  NSR with rate 71; nonspecific ST changes with no evidence of acute ischemia Prolonged QT    Labs on Admission: I have personally reviewed the available labs and imaging studies at the time of the admission.  Pertinent labs:   COVID positive,  potassium: 5.9,  BUN: 47,  creatinine: 5.61,  AST: 108,  ALT: 49,  AP: 252, lactic acid: 4.7,   Assessment and Plan: Principal Problem:   Septic shock vs. hypovolemic shock in setting of covid 19  Active Problems:   Hypotension   Anemia of chronic renal failure   Acute respiratory failure with hypoxia (HCC)   End-stage renal disease on hemodialysis with hyperkalemia    Prolonged QT interval   Transaminitis   H/O: CVA (cerebrovascular accident)   Hyperlipidemia    Assessment and Plan: * Septic shock vs. hypovolemic shock in setting of covid 30  71 year old female presenting with weakness and hypotension found to be altered, hypoxic and septic with lactic acid of 4.7 and persistent hypotension -admit to progressive, but due to concern for elevated lactic acid, hypotension and sepsis have called ICU to see patient -bolus another 500cc now -stat vbg -continue broad spectrum antibiotics. History of staph bacteremia in  04/2022.  -cultures pending -check covid labs  -trend lactic acid   Acute respiratory failure with hypoxia (HCC) New hypoxia initially requiring 6L to maintain saturation, now on 4L -stat CTA chest with covid history -stat vbg -ICU consult/admit -steroids  -does not appear overloaded on exam/CXR. No wheezing or evidence of covid pneumonia   End-stage renal disease on hemodialysis with hyperkalemia  Nephrology consulted and ordered lokelma; however, not safe to take any PO meds at this time.  Had dialysis session yesterday and no evidence of overload Appreciate nephrology assistance   Prolonged QT interval Optimize electrolytes Keep on telemetry Avoid qt prolonging drugs  Repeat ekg in AM    Transaminitis Mildly elevated in setting of Covid-19 and shock  Bolused Maintain map >65 Trend   H/O: CVA (cerebrovascular accident) Continue statin/plavix   Hyperlipidemia Continue statin    ICU to admit patient.   Advance Care Planning:   Code Status: Prior   Consults: nehprology/ICU   DVT Prophylaxis: heparin   Family Communication: attempted to call sister.   Severity of Illness: The appropriate patient status for this patient is INPATIENT. Inpatient status is judged to be reasonable and necessary in order to provide the required intensity of service to ensure the patient's safety. The patient's presenting symptoms, physical exam findings, and initial radiographic and laboratory data in the context of their chronic comorbidities is felt to place them at high risk for further clinical deterioration. Furthermore, it is not anticipated that the patient will be medically stable for discharge from the hospital within 2 midnights of admission.   * I certify that at the point of admission it is my clinical judgment that the patient will require inpatient hospital care spanning beyond 2 midnights from the point of admission due to high intensity of service, high risk for further  deterioration and high frequency of surveillance required.*  Author: Orma Flaming, MD 09/10/2022 7:26 PM  For on call review [email or web address].

## 2022-09-10 NOTE — ED Notes (Signed)
Pt O2 sats 86% on RA; placed on 2L [place], 96%

## 2022-09-10 NOTE — Assessment & Plan Note (Signed)
71 year old female presenting with weakness and hypotension found to be altered, hypoxic and septic with lactic acid of 4.7 and persistent hypotension -admit to progressive, but due to concern for elevated lactic acid, hypotension and sepsis have called ICU to see patient -bolus another 500cc now -stat vbg -continue broad spectrum antibiotics. History of staph bacteremia in 04/2022.  -cultures pending -check covid labs  -trend lactic acid

## 2022-09-10 NOTE — ED Triage Notes (Signed)
Pt from home via [facility] for eval of weakness/ hypotension, began after dialysis yesterday. Full tx yesterday, compliant w MWF tx. Pt A&O4, endorses 2 episodes of emesis yesterday, denies diarrhea, SHOB, CP, sick contact.   EMS BP 88 systolic No IV/meds PTA

## 2022-09-10 NOTE — Sepsis Progress Note (Signed)
Notified bedside nurse of need to draw repeat lactic acid. 

## 2022-09-10 NOTE — H&P (Signed)
NAME:  [name], MRN:  [id number], DOB:  19-Oct-1951, LOS: 0 ADMISSION DATE:  09/10/2022, CONSULTATION DATE:  09/10/22 REFERRING MD:  [name], CHIEF COMPLAINT:  shock  History of Present Illness:  [name] is a 71 y/o woman with a history of ESRD who presented to the hospital with SOB, diarrhea, nausea for the past few days. She presented with new oxygen requirement and a fever. No sick contacts. She endorses some nausea and diarrhea but has the main complaint of rectal discomfort. She received a 500cc IVF bolus and was started on empiric antibiotics (Cefepime, Vanc, Flagyl) for presumed bacteremia after 1 blood culture was obtained. Unfortunately after her fluid bolus her BP dropped and CCM was consulted for hypotension and hypoxia. Last HD was yesterday, run was cut short.   Pertinent  Medical History  ESRD on HD HFrEF CAD GERD HTN> chronic hypotension on midodrine now HLD PAD, critical limb ischemia  Significant Hospital Events: Including procedures, antibiotic start and stop dates in addition to other pertinent events   9/28 Admitted   Interim History / Subjective:    Objective   Blood pressure (!) 124/37, pulse 85, temperature (!) 100.8 F (38.2 C), temperature source Rectal, resp. rate (!) 22, height '5\' 3"'$  (1.6 m), weight 46.1 kg, SpO2 97 %.        Intake/Output Summary (Last 24 hours) at 09/10/2022 1755 Last data filed at 09/10/2022 1440 Gross per 24 hour  Intake 72.62 ml  Output --  Net 72.62 ml   Filed Weights   09/10/22 1225  Weight: 46.1 kg    Examination: General: chronically ill appearing woman lying in bed in NAD HENT: [place]/AT, eyes anicteric. Dilated R heart failure.  Lungs: CTAB, mild tachypnea, no accessory muscle use Cardiovascular: B8G6, systolic murmur throughout precordium, split P2 Abdomen: soft, NT Extremities: dilated clotted fistula on left, thrill over subclavian and R arm graft. No edema. Neuro: awake, confused, moving all extremities   Derm: warm, thin skin  LA 4.7 K+ 5.9 BUN 47 Cr 5.61 AST 108 ALT 49 T bili 1.6 Covid +  Resolved Hospital Problem list     Assessment & Plan:  Covid with associated diarrhea -steroids -imodium  Acute respiratory failure with hypoxia -supplemental O2 to maintain SpO2 >90% -pulmonary hygiene  Shock, likely hypovolemic with recent GI losses and poor PO intake. Septic shock is also possible. She has chronic hypotension with midodrine '10mg'$  TID use at home.  -complete 30cc/kg IBW bolus -increase midodrine to '15mg'$  TID -norepi to maintain MAP >65; can run peripherally at low doses -agree with empiric coverage for bacteremia; thankfully no tunneled catheters. Graft does not appear infected.   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 If needing CRRT, will need an HD cath. -lokelma -con't plavix for HD graft -phos binders  Elevated transaminases and hyperbilirubinemia-- likely 2/2 sepsis but could have cardiac cirrhosis -monitor  Surrogate decision maker is her son. She wants to be full code. May need palliative care consult this admission.  Best Practice (right click and "Reselect all SmartList Selections" daily)   Diet/type: Regular consistency (see orders) DVT prophylaxis: prophylactic heparin  GI prophylaxis: PPI Lines: N/A Foley:  N/A Code Status:  full code Last date of multidisciplinary goals of care discussion [unable to  update son]  Labs   CBC: Recent Labs  Lab 09/07/22 0407 09/07/22 0424 09/10/22 1353  WBC 5.8  --  8.0  NEUTROABS 3.0  --  5.7  HGB 11.1* 11.6* 12.4  HCT 34.3* 34.0* 40.0  MCV 97.2  --  102.6*  PLT 195  --  163    Basic Metabolic Panel: Recent Labs   Lab 09/07/22 0407 09/07/22 0424 09/10/22 1353  NA 134* 129* 139  K 6.1* 5.8* 5.9*  CL 92*  --  94*  CO2 22  --  24  GLUCOSE 80  --  95  BUN 78*  --  47*  CREATININE 5.78*  --  5.61*  CALCIUM 8.5*  --  8.2*   GFR: Estimated Creatinine Clearance: 6.6 mL/min (A) (by C-G formula based on SCr of 5.61 mg/dL (H)). Recent Labs  Lab 09/07/22 0407 09/10/22 1353 09/10/22 1357  WBC 5.8 8.0  --   LATICACIDVEN  --   --  4.7*    Liver Function Tests: Recent Labs  Lab 09/07/22 0407 09/10/22 1353  AST 44* 108*  ALT 28 49*  ALKPHOS 206* 252*  BILITOT 0.7 1.6*  PROT 6.6 7.5  ALBUMIN 3.2* 3.3*   Recent Labs  Lab 09/07/22 0407  LIPASE 49   No results for input(s): "AMMONIA" in the last 168 hours.  ABG    Component Value Date/Time   PHART 7.361 04/27/2022 1941   PCO2ART 42.2 04/27/2022 1941   PO2ART 424 (H) 04/27/2022 1941   HCO3 21.8 09/07/2022 0424   TCO2 23 09/07/2022 0424   ACIDBASEDEF 2.0 04/27/2022 1941   O2SAT 98 09/07/2022 0424     Coagulation Profile: Recent Labs  Lab 09/10/22 1353  INR 1.8*    Cardiac Enzymes: No results for input(s): "CKTOTAL", "CKMB", "CKMBINDEX", "TROPONINI" in the last 168 hours.  HbA1C: Hgb A1c MFr Bld  Date/Time Value Ref Range Status  02/22/2021 02:49 AM 5.0 4.8 - 5.6 % Final    Comment:    (NOTE) Pre diabetes:          5.7%-6.4%  Diabetes:              >6.4%  Glycemic control for   <7.0% adults with diabetes   04/02/2020 09:16 AM 5.4 4.8 - 5.6 % Final    Comment:    (NOTE) Pre diabetes:          5.7%-6.4% Diabetes:              >6.4% Glycemic control for   <7.0% adults with diabetes     CBG: No results for input(s): "GLUCAP" in the last 168 hours.  Review of Systems:   Review of Systems  Constitutional:  Positive for malaise/fatigue. Negative for fever.  HENT: Negative.    Eyes: Negative.   Respiratory:  Positive for cough and shortness of breath.   Cardiovascular:  Negative for chest pain and leg  swelling.  Gastrointestinal:  Positive for diarrhea and nausea. Negative for vomiting.  Genitourinary: Negative.   Skin:  Negative for rash.  Neurological:  Positive for weakness.     Past Medical History:  She,  has a past medical history of Acute ischemic stroke ([place]) (02/23/2017), Acute on chronic diastolic CHF (congestive heart failure) ([place]), Adenomatous polyp of colon (10/2010, 2006, 2015), Anemia in CKD (chronic kidney disease) (11/07/2012), Arthritis, CAD (coronary artery disease), Critical limb ischemia of left lower extremity with ulceration of foot ([place]) (03/17/2022), Depression with anxiety, Diverticulitis of colon with perforation (08/06/2021), ESRD (  end stage renal disease) ([place]) (11/07/2012), GERD (gastroesophageal reflux disease), GI bleed (2017), Hyperlipidemia, Hypertension, Neuromuscular disorder ([place]), Osteoporosis, and 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   Surgical History:   Past Surgical History:  Procedure Laterality Date   ABDOMINAL AORTOGRAM W/LOWER EXTREMITY N/A 03/26/2022   Procedure: ABDOMINAL AORTOGRAM W/LOWER EXTREMITY;  Surgeon: Marty Heck, MD;  Location: [facility];  Service: Cardiovascular;  Laterality: N/A;  Left   AV FISTULA PLACEMENT     for dialysis   AV FISTULA PLACEMENT Left 11/22/2015   Procedure: ARTERIOVENOUS (AV) FISTULA CREATION-LEFT BRACHIOCEPHALIC;  Surgeon: Serafina Mitchell, MD;  Location: [facility];  Service: Vascular;  Laterality: Left;   AV FISTULA PLACEMENT Right 03/15/2020   Procedure: INSERTION OF ARTERIOVENOUS (AV) GORE-TEX GRAFT ARM ( BRACHIAL AXILLARY );  Surgeon: Katha Cabal, MD;  Location: [facility];  Service: Vascular;  Laterality: Right;   AV FISTULA PLACEMENT Right 05/15/2022   Procedure: EXPLORATION  OF  RIGHT ARM ARTERIOVENOUS GORE-TEX GRAFT;  Surgeon: Angelia Mould, MD;  Location: [facility];  Service: Vascular;  Laterality: Right;   [place] REMOVAL Right 04/27/2022   Procedure: REVISION OF RIGHT  ARTERIOVENOUS GRAFT AND EXCISION OF RIGHT DEGENERATED GRAFT (AVG);  Surgeon: Angelia Mould, MD;  Location: [facility];  Service: Vascular;  Laterality: Right;   BACK SURGERY     CERVICAL FUSION     CHOLECYSTECTOMY  12/02/2012   Procedure: LAPAROSCOPIC CHOLECYSTECTOMY WITH INTRAOPERATIVE CHOLANGIOGRAM;  Surgeon: Edward Jolly, MD;  Location: [facility];  Service: General;  Laterality: N/A;   EYE SURGERY Bilateral    cataract surgery   EYE SURGERY Left 2019   laser   HEMATOMA EVACUATION Left 12/24/2016   Procedure: EVACUATION HEMATOMA LEFT UPPER ARM;  Surgeon: Waynetta Sandy, MD;  Location: [facility];  Service: Vascular;  Laterality: Left;   I & D EXTREMITY Left 12/31/2016   Procedure: IRRIGATION AND DEBRIDEMENT EXTREMITY;  Surgeon: Angelia Mould, MD;  Location: [facility];  Service: Vascular;  Laterality: Left;   INSERTION OF DIALYSIS CATHETER Right 12/24/2016   Procedure: INSERTION OF DIALYSIS CATHETER;  Surgeon: Waynetta Sandy, MD;  Location: [facility];  Service: Vascular;  Laterality: Right;   INSERTION OF DIALYSIS CATHETER Right 02/04/2017   Procedure: INSERTION OF DIALYSIS CATHETER;  Surgeon: Waynetta Sandy, MD;  Location: [facility];  Service: Vascular;  Laterality: Right;   [place] Left 10/23/2016   Procedure: Fistulagram;  Surgeon: Elam Dutch, MD;  Location: [facility];  Service: Cardiovascular;  Laterality: Left;   PERIPHERAL VASCULAR INTERVENTION  03/26/2022   Procedure: PERIPHERAL VASCULAR INTERVENTION;  Surgeon: Marty Heck, MD;  Location: [facility];  Service: Cardiovascular;;  left sfa   PERIPHERAL VASCULAR THROMBECTOMY Right 04/16/2020   Procedure: PERIPHERAL VASCULAR THROMBECTOMY;  Surgeon: Katha Cabal, MD;  Location: [facility];  Service: Cardiovascular;  Laterality: Right;   RESECTION OF ARTERIOVENOUS FISTULA ANEURYSM Left 11/22/2015   Procedure: RESECTION  OF LEFT RADIOCEPHALIC FISTULA ANEURYSM ;  Surgeon: Serafina Mitchell, MD;  Location: [facility];  Service: Vascular;  Laterality: Left;   REVISON OF ARTERIOVENOUS FISTULA Left 12/22/2016   Procedure: REVISON OF LEFT ARTERIOVENOUS FISTULA;  Surgeon: Waynetta Sandy, MD;  Location: [facility];  Service: Vascular;  Laterality: Left;   REVISON OF ARTERIOVENOUS FISTULA Left 02/04/2017   Procedure: REVISON OF LEFT UPPER ARM ARTERIOVENOUS FISTULA;  Surgeon: Waynetta Sandy, MD;  [facility];  Service: Vascular;  Laterality: Left;   UPPER EXTREMITY ANGIOGRAPHY Bilateral 09/19/2019   Procedure: UPPER EXTREMITY ANGIOGRAPHY;  Surgeon: Katha Cabal, MD;  Location: [facility];  Service: Cardiovascular;  Laterality: Bilateral;     Social History:   reports that she quit smoking about 30 years ago. Her smoking use included cigarettes. She has never been exposed to tobacco smoke. She has never used smokeless tobacco. She reports that she does not drink alcohol and does not use drugs.   Family History:  Her family history includes Cancer in her brother; Colon cancer in her brother; Coronary artery disease (age of onset: 21) in her mother; Hyperlipidemia in her mother; Hypertension in her mother; Stroke in her maternal aunt. There is no history of Esophageal cancer, Stomach cancer, or Rectal cancer.   Allergies Allergies  Allergen Reactions   Sulfa Antibiotics Other (See Comments)    Per patient, both parents allergic-so will not take   Adhesive [Tape] Itching     Home Medications  Prior to Admission medications   Medication Sig Start Date End Date Taking? Authorizing Provider  acetaminophen (TYLENOL) 500 MG tablet Take 1 tablet (500 mg total) by mouth every 6 (six) hours as needed. Patient taking differently: Take 500 mg by mouth every 6 (six) hours as needed (pain). 09/05/18   Law, Bea Graff, PA-C  albuterol (PROVENTIL) (2.5 MG/3ML) 0.083% nebulizer solution Take 3 mLs (2.5 mg  total) by nebulization every 4 (four) hours as needed for wheezing. Patient taking differently: Take 2.5 mg by nebulization every 6 (six) hours as needed for wheezing. 05/04/22   Elgergawy, Silver Huguenin, MD  albuterol (VENTOLIN HFA) 108 (90 Base) MCG/ACT inhaler Inhale 1-2 puffs into the lungs every 6 (six) hours as needed for wheezing or shortness of breath. Patient taking differently: Inhale 2 puffs into the lungs every 6 (six) hours as needed for shortness of breath. 01/11/22   Mesner, Corene Cornea, MD  atorvastatin (LIPITOR) 80 MG tablet Take 80 mg by mouth at bedtime. 03/25/22   [provider]  B Complex-C-Folic Acid (DIALYVITE 149) 0.8 MG WAFR Take 1 tablet by mouth every morning. 01/28/22   [provider]  clopidogrel (PLAVIX) 75 MG tablet Take 1 tablet (75 mg total) by mouth daily. Patient taking differently: Take 75 mg by mouth at bedtime. 03/26/22 03/26/23  Marty Heck, MD  doxycycline (VIBRA-TABS) 100 MG tablet Take 1 tablet (100 mg total) by mouth 2 (two) times daily. 07/02/22   Laurice Record, MD  escitalopram (LEXAPRO) 10 MG tablet Take 10 mg by mouth every morning. 03/25/22   [provider]  hydrOXYzine (ATARAX) 25 MG tablet Take 1 tablet (25 mg total) by mouth every 8 (eight) hours as needed for itching. 05/21/22   Mercy Riding, MD  loperamide (IMODIUM) 2 MG capsule Take 1 capsule (2 mg total) by mouth 4 (four) times daily as needed for diarrhea or loose stools. 09/07/22   Redwine, Madison A, PA-C  midodrine (PROAMATINE) 10 MG tablet Take 1 tablet (10 mg total) by mouth 3 (three) times daily with meals. 05/21/22   Mercy Riding, MD  Nutritional Supplements (NEPRO) LIQD Take 237 mLs by mouth 2 (two) times daily between meals.    [provider]  ondansetron (ZOFRAN) 8 MG tablet Take 8 mg by mouth every 12 (twelve) hours as needed for nausea or vomiting.    [provider]  OVER THE COUNTER MEDICATION Take 1 tablet by mouth every morning.  Folic acid -  vitamin B complex - vitamin C - selenium - zinc 3 mg    [provider]  pantoprazole (PROTONIX) 40 MG tablet Take 1 tablet (40 mg total) by mouth daily. 08/18/21   Nita Sells, MD  sevelamer (RENAGEL) 800 MG tablet Take 1,600 mg by mouth 3 (three) times daily with meals. 03/24/22   [provider]  zolpidem (AMBIEN) 5 MG tablet Take 1 tablet (5 mg total) by mouth at bedtime as needed for up to 10 days for sleep (Insomnia). 05/21/22 05/31/22  Mercy Riding, MD     Critical care time: 45 min.    Julian Hy, DO 09/10/22 5:55 PM [facility]

## 2022-09-10 NOTE — Assessment & Plan Note (Addendum)
Continue statin/plavix

## 2022-09-10 NOTE — Sepsis Progress Note (Signed)
eLink monitoring code sepsis.  

## 2022-09-10 NOTE — Assessment & Plan Note (Signed)
Nephrology consulted and ordered lokelma; however, not safe to take any PO meds at this time.  Had dialysis session yesterday and no evidence of overload Appreciate nephrology assistance

## 2022-09-10 NOTE — Sepsis Progress Note (Signed)
Notified bedside nurse of need to order repeat lactic acid.  

## 2022-09-10 NOTE — ED Provider Notes (Signed)
[facility] EMERGENCY DEPARTMENT Provider Note   CSN: [id number] Arrival date & time: 09/10/22  1222     History  Chief Complaint  Patient presents with   Weakness   Hypotension    [name] is a 71 y.o. female.   Weakness    Patient with medical history of end-stage renal disease dialysis Monday Wednesday Friday, CHF, GERD, hypertension, hyperlipidemia presents today due to generalized weakness x1 day.  Called EMS secondary to weakness, she was found to be systolic with a blood pressure of 88 so brought to ED.  started yesterday after dialysis, she did complete dialysis.  She denies any shortness of breath, chest pain, abdominal pain, diarrhea with me although she was seen in the ED 3 days ago for diarrhea. She vomited twice yesterday which is nonbilious nonbloody.  My evaluation of the patient patient was newly hypoxic 86%, but on 2 L supplemental air.  Rectal temperature was concerning for fever of 100.8, blood pressure was also soft at 103/35.  Home Medications Prior to Admission medications   Medication Sig Start Date End Date Taking? Authorizing Provider  acetaminophen (TYLENOL) 500 MG tablet Take 1 tablet (500 mg total) by mouth every 6 (six) hours as needed. Patient taking differently: Take 500 mg by mouth every 6 (six) hours as needed (pain). 09/05/18   Law, Bea Graff, PA-C  albuterol (PROVENTIL) (2.5 MG/3ML) 0.083% nebulizer solution Take 3 mLs (2.5 mg total) by nebulization every 4 (four) hours as needed for wheezing. Patient taking differently: Take 2.5 mg by nebulization every 6 (six) hours as needed for wheezing. 05/04/22   Elgergawy, Silver Huguenin, MD  albuterol (VENTOLIN HFA) 108 (90 Base) MCG/ACT inhaler Inhale 1-2 puffs into the lungs every 6 (six) hours as needed for wheezing or shortness of breath. Patient taking differently: Inhale 2 puffs into the lungs every 6 (six) hours as needed for shortness of breath. 01/11/22   Mesner, Corene Cornea, MD   atorvastatin (LIPITOR) 80 MG tablet Take 80 mg by mouth at bedtime. 03/25/22   [provider]  B Complex-C-Folic Acid (DIALYVITE 381) 0.8 MG WAFR Take 1 tablet by mouth every morning. 01/28/22   [provider]  clopidogrel (PLAVIX) 75 MG tablet Take 1 tablet (75 mg total) by mouth daily. Patient taking differently: Take 75 mg by mouth at bedtime. 03/26/22 03/26/23  Marty Heck, MD  doxycycline (VIBRA-TABS) 100 MG tablet Take 1 tablet (100 mg total) by mouth 2 (two) times daily. 07/02/22   Laurice Record, MD  escitalopram (LEXAPRO) 10 MG tablet Take 10 mg by mouth every morning. 03/25/22   [provider]  hydrOXYzine (ATARAX) 25 MG tablet Take 1 tablet (25 mg total) by mouth every 8 (eight) hours as needed for itching. 05/21/22   Mercy Riding, MD  loperamide (IMODIUM) 2 MG capsule Take 1 capsule (2 mg total) by mouth 4 (four) times daily as needed for diarrhea or loose stools. 09/07/22   Redwine, Madison A, PA-C  midodrine (PROAMATINE) 10 MG tablet Take 1 tablet (10 mg total) by mouth 3 (three) times daily with meals. 05/21/22   Mercy Riding, MD  Nutritional Supplements (NEPRO) LIQD Take 237 mLs by mouth 2 (two) times daily between meals.    [provider]  ondansetron (ZOFRAN) 8 MG tablet Take 8 mg by mouth every 12 (twelve) hours as needed for nausea or vomiting.    [provider]  OVER THE COUNTER MEDICATION Take 1 tablet by mouth every  morning. Folic acid - vitamin B complex - vitamin C - selenium - zinc 3 mg    [provider]  pantoprazole (PROTONIX) 40 MG tablet Take 1 tablet (40 mg total) by mouth daily. 08/18/21   Nita Sells, MD  sevelamer (RENAGEL) 800 MG tablet Take 1,600 mg by mouth 3 (three) times daily with meals. 03/24/22   [provider]  zolpidem (AMBIEN) 5 MG tablet Take 1 tablet (5 mg total) by mouth at bedtime as needed for up to 10 days for sleep (Insomnia). 05/21/22 05/31/22  Mercy Riding, MD       Allergies    Sulfa antibiotics and Adhesive [tape]    Review of Systems   Review of Systems  Neurological:  Positive for weakness.    Physical Exam Updated Vital Signs BP (!) 122/53   Pulse 88   Temp (!) 100.8 F (38.2 C) (Rectal)   Resp 18   Ht '5\' 3"'$  (1.6 m)   Wt 46.1 kg   SpO2 98%   BMI 18.00 kg/m  Physical Exam Vitals and nursing note reviewed. Exam conducted with a chaperone present.  Constitutional:      Appearance: Normal appearance. She is ill-appearing.     Comments: Chronically ill-appearing patient  HENT:     Head: Normocephalic and atraumatic.  Eyes:     General: No scleral icterus.       Right eye: No discharge.        Left eye: No discharge.     Extraocular Movements: Extraocular movements intact.     Pupils: Pupils are equal, round, and reactive to light.  Cardiovascular:     Rate and Rhythm: Normal rate and regular rhythm.     Pulses: Normal pulses.     Heart sounds: Normal heart sounds.     No friction rub. No gallop.  Pulmonary:     Effort: Pulmonary effort is normal.     Breath sounds: Normal breath sounds.  Abdominal:     General: Abdomen is flat. Bowel sounds are normal. There is no distension.     Palpations: Abdomen is soft.     Tenderness: There is no abdominal tenderness.     Comments: Abdomen is nontender  Skin:    General: Skin is warm and dry.     Comments: AV fistula to bilateral upper extremities with palpable thrill.  No surrounding erythema or purulence  Neurological:     General: No focal deficit present.     Mental Status: She is alert. Mental status is at baseline.     Coordination: Coordination normal.     ED Results / Procedures / Treatments   Labs (all labs ordered are listed, but only abnormal results are displayed) Labs Reviewed  CBC WITH DIFFERENTIAL/PLATELET - Abnormal; Notable for the following components:      Result Value   MCV 102.6 (*)    RDW 19.9 (*)    Monocytes Absolute 1.3 (*)    All other  components within normal limits  PROTIME-INR - Abnormal; Notable for the following components:   Prothrombin Time 20.7 (*)    INR 1.8 (*)    All other components within normal limits  CULTURE, BLOOD (ROUTINE X 2)  CULTURE, BLOOD (ROUTINE X 2)  SARS CORONAVIRUS 2 BY RT PCR  APTT  LACTIC ACID, PLASMA  LACTIC ACID, PLASMA  COMPREHENSIVE METABOLIC PANEL  MAGNESIUM  BRAIN NATRIURETIC PEPTIDE  TROPONIN I (HIGH SENSITIVITY)    EKG EKG Interpretation  Date/Time:  Thursday  September 10 2022 12:34:11 EDT Ventricular Rate:  71 PR Interval:  189 QRS Duration: 106 QT Interval:  493 QTC Calculation: 536 R Axis:   91 Text Interpretation: Sinus rhythm Right axis deviation Nonspecific T abnormalities, lateral leads Prolonged QT interval No significant change since last tracing Confirmed by Blanchie Dessert ([id number]) on 09/10/2022 12:37:43 PM  Radiology DG Chest Portable 1 View  Result Date: 09/10/2022 CLINICAL DATA:  Weakness/hypotension, onset after dialysis yesterday. Two episodes of emesis yesterday. EXAM: PORTABLE CHEST 1 VIEW COMPARISON:  Chest x-rays dated 09/07/2022, 08/19/2022 and 02/02/2022. FINDINGS: Stable cardiomegaly. Lungs are clear. No pleural effusion or pneumothorax is seen. Visualized osseous structures about the chest are unremarkable. IMPRESSION: No active disease. No evidence of pneumonia or pulmonary edema. Stable cardiomegaly. Electronically Signed   By: Franki Cabot M.D.   On: 09/10/2022 13:36    Procedures .Critical Care  Performed by: Sherrill Raring, PA-C Authorized by: Sherrill Raring, PA-C   Critical care provider statement:    Critical care time (minutes):  30   Critical care start time:  09/10/2022 2:00 PM   Critical care end time:  09/10/2022 2:30 PM   Critical care was necessary to treat or prevent imminent or life-threatening deterioration of the following conditions:  Sepsis   Critical care was time spent personally by me on the following activities:  Development of  treatment plan with patient or surrogate, discussions with consultants, evaluation of patient's response to treatment, examination of patient, ordering and review of laboratory studies, ordering and review of radiographic studies, ordering and performing treatments and interventions, pulse oximetry, re-evaluation of patient's condition and review of old charts     Medications Ordered in ED Medications  lactated ringers bolus 500 mL (500 mLs Intravenous New Bag/Given 09/10/22 1408)  metroNIDAZOLE (FLAGYL) IVPB 500 mg (500 mg Intravenous New Bag/Given 09/10/22 1455)  vancomycin (VANCOCIN) IVPB 1000 mg/200 mL premix (has no administration in time range)  ceFEPIme (MAXIPIME) 1 g in sodium chloride 0.9 % 100 mL IVPB (has no administration in time range)  vancomycin (VANCOREADY) IVPB 500 mg/100 mL (has no administration in time range)  acetaminophen (TYLENOL) tablet 1,000 mg (1,000 mg Oral Given 09/10/22 1408)    ED Course/ Medical Decision Making/ A&P                           Medical Decision Making Amount and/or Complexity of Data Reviewed Labs: ordered. Radiology: ordered. ECG/medicine tests: ordered.  Risk OTC drugs. Prescription drug management.   Patient presents due to generalized weakness.  Found to be hypotensive and febrile.  Concern for sepsis, sepsis work-up initiated and undifferentiated antibiotics ordered.  I reviewed external medical records.  I also reviewed previous ED visit including visit 09/07/2022 with a CT abdomen is negative for any acute process.   Patient does not make urine.  She was admitted in May of this year due to sepsis secondary to infected AV fistula graft.  Reviewed patient's home meds, clopidogrel.  I ordered undifferentiated sepsis antibiotics, Tylenol for fever, small fluid bolus of 500 given CHF and CKD.    Patient is on cardiac monitoring in sinus rhythm.  EKG shows sinus rhythm with QT prolongation.  Chest x-ray ordered, viewed interpreted myself.   No acute process, no pulmonary edema or focal consolidation.  Agree with radiologist.  I ordered, viewed and interpreted laboratory work-up. CBC with leukocytosis no leukocytosis, not anemic CMP pending COVID pending Lactic acid pending. Blood cultures pending Magnesium  pending BNP pending APTT within normal limits PT/INR mildly elevated  Considered PE but patient is not having chest pain, not tachycardic and I think less likely.  I reevaluated the patient, she is no longer requiring supplemental oxygen is satting at 99%.  Not sure if the initial reading was accurate or secondary to to poor peripheral circulation/cold extremities,.  We will continue to monitor.  I considered repeating CT abdominal pelvis study but given 3 days ago was negative I also do not feel this would be particularly helpful.  Additionally is not having any abdominal tenderness and abdominal exam is benign.  Suspect the source is likely bacteremia given dialysis patient for weakness.  Remaining work-up is still pending at time of shift change, plan discussed with PA Irene Pap who is taking over care.  Patient will need admission for sepsis/SIRS work-up.  Discussed HPI, physical exam and plan of care for this patient with attending Gust Brooms. The attending physician evaluated this patient as part of a shared visit and agrees with plan of care.          Final Clinical Impression(s) / ED Diagnoses Final diagnoses:  SIRS (systemic inflammatory response syndrome) (HCC)  Hypotension, unspecified hypotension type  ESRD on dialysis [facility])    Rx / DC Orders ED Discharge Orders     None         Sherrill Raring, PA-C 09/10/22 1507    Blanchie Dessert, MD 09/12/22 2020

## 2022-09-10 NOTE — Assessment & Plan Note (Addendum)
Mildly elevated in setting of Covid-19 and shock  Bolused Maintain map >65 Trend

## 2022-09-10 NOTE — Assessment & Plan Note (Addendum)
New hypoxia initially requiring 6L to maintain saturation, now on 4L -stat CTA chest with covid history -stat vbg -ICU consult/admit -steroids  -does not appear overloaded on exam/CXR. No wheezing or evidence of covid pneumonia

## 2022-09-10 NOTE — ED Provider Notes (Signed)
Physical Exam  BP (!) 124/37   Pulse 85   Temp (!) 100.8 F (38.2 C) (Rectal)   Resp (!) 22   Ht '5\' 3"'$  (1.6 m)   Wt 46.1 kg   SpO2 97%   BMI 18.00 kg/m   Physical Exam Vitals and nursing note reviewed.  Constitutional:      Appearance: She is ill-appearing.  HENT:     Head: Normocephalic and atraumatic.     Nose: Nose normal.  Eyes:     Pupils: Pupils are equal, round, and reactive to light.  Cardiovascular:     Rate and Rhythm: Normal rate.  Pulmonary:     Breath sounds: No wheezing.     Comments: [place] 4L Abdominal:     General: Abdomen is flat.     Tenderness: There is no abdominal tenderness.  Musculoskeletal:     Cervical back: Normal range of motion and neck supple.  Skin:    General: Skin is warm and dry.  Neurological:     Mental Status: She is alert and oriented to person, place, and time.     Procedures  Procedures  ED Course / MDM   Clinical Course as of 09/10/22 1648  Thu Sep 10, 2022  1554 Lactic Acid, Venous(!!): 4.7 [JS]  1554 Potassium(!): 5.9 [JS]  1613 SARS Coronavirus 2 by RT PCR(!): POSITIVE [JS]    Clinical Course User Index [JS] Janeece Fitting, PA-C   Medical Decision Making Amount and/or Complexity of Data Reviewed Labs: ordered. Decision-making details documented in ED Course. Radiology: ordered. ECG/medicine tests: ordered.  Risk OTC drugs. Prescription drug management.  Patient care assumed from [facility] S PA at shift change, please see her note for full HPI.  Briefly, patient here with a fever, found to be hypotensive on scene with an 80 systolics.  Patient is a dialysis patient Monday, Wednesday, Friday last dialyzed yesterday with the full treatment.  She is endorsing weakness all over.  Does have a recent admission in the month of May for bacteremia per prior provider.  This criteria has been started, given Tylenol for fever of 100.8.  He hypoxic with an oxygen saturation of 86%.  Antibiotics have been started for phylactic  treatment.  Prior records EF of 50-55, given small dose of fluids. Plan is for labs, admission for suspicion of sepsis bacteremia.  Xray of the chest showed no acute findings.  4:13 PM patient evaluated by me she is currently on 4 L nasal cannula satting at 97%.  She reports feeling overall weak, like she is unable to go about her ADLs.  She is currently living with her mother, does take care of herself on her own.  Labs interpreted by me are [place] for hyperkalemia with a potassium of 5.9, did have dialysis yesterday we will call nephrology in order to get their input.  LFTs are elevated on today's visit.  Anion gap of 21.  No leukocytosis on her CBC.  However, lactic is 4.7, she was started on empiric antibiotics sepsis.  Her COVID-19 test is positive on today's visit.  Pressure was rechecked by me personally in the room oral temp is 98.2.  With new hypoxia, hyperkalemia, with 19 positive will need admission for further management.  4:46 PM Spoke to Dr. Rogers Blocker, hospitalist who will admit patient for further management. We are also awaiting nephrology feedback.    Portions of this note were generated with Lobby[employer]. Dictation errors may occur despite best attempts at proofreading.  Janeece Fitting, PA-C 09/10/22 Secor, Baird, DO 09/10/22 2041

## 2022-09-10 NOTE — Assessment & Plan Note (Signed)
Continue statin. 

## 2022-09-10 NOTE — Progress Notes (Addendum)
eLink Physician-Brief Progress Note Patient Name: [name] DOB: 07/09/1951 MRN: [id number]   Date of Service  09/10/2022  HPI/Events of Note  Hypotension - BP = 89/35 aith MAP = 50 on maximum Norepinephrine IV infusion via PIV. Albumin = 3.3.   eICU Interventions  Plan: Titrate Norepinephrine IV infusion via PIV. Will request that PCCM ground team evaluate the patient for central venous line placement. 25% Albumin 25 gm IV now.  CVP post central venous line placement.      Intervention Category Major Interventions: Hypotension - evaluation and management  Lysle Dingwall 09/10/2022, 11:34 PM

## 2022-09-10 NOTE — Assessment & Plan Note (Signed)
Optimize electrolytes Keep on telemetry Avoid qt prolonging drugs  Repeat ekg in AM   

## 2022-09-10 NOTE — Significant Event (Signed)
Care Contact Phone Call  [name]'s son, [name], at [phone]. They did not answer the phone at this time and were not able to be updated on [name]'s clinical status and plan.  Redmond School., MSN, APRN, AGACNP-BC [facility]  09/10/2022 , 6:05 PM  Please see [email or web address] for pager details  If no response, please call [phone] After hours, please call Elink at [phone]

## 2022-09-11 ENCOUNTER — Telehealth (HOSPITAL_COMMUNITY): Payer: Self-pay | Admitting: *Deleted

## 2022-09-11 DIAGNOSIS — J9601 Acute respiratory failure with hypoxia: Secondary | ICD-10-CM | POA: Diagnosis not present

## 2022-09-11 DIAGNOSIS — Z992 Dependence on renal dialysis: Secondary | ICD-10-CM | POA: Diagnosis not present

## 2022-09-11 DIAGNOSIS — N186 End stage renal disease: Secondary | ICD-10-CM | POA: Diagnosis not present

## 2022-09-11 LAB — LACTATE DEHYDROGENASE: LDH: 391 U/L — ABNORMAL HIGH (ref 98–192)

## 2022-09-11 LAB — TROPONIN I (HIGH SENSITIVITY)
Troponin I (High Sensitivity): 471 ng/L (ref <18)
Troponin I (High Sensitivity): 417 ng/L (ref <18)

## 2022-09-11 LAB — CBC WITH DIFFERENTIAL/PLATELET
Abs Immature Granulocytes: 0.04 10*3/uL (ref 0.00–0.07)
Basophils Absolute: 0 10*3/uL (ref 0.0–0.1)
Basophils Relative: 0 %
Eosinophils Absolute: 0 10*3/uL (ref 0.0–0.5)
Eosinophils Relative: 0 %
HCT: 33.4 % — ABNORMAL LOW (ref 36.0–46.0)
Hemoglobin: 11.1 g/dL — ABNORMAL LOW (ref 12.0–15.0)
Immature Granulocytes: 1 %
Lymphocytes Relative: 13 %
Lymphs Abs: 0.8 10*3/uL (ref 0.7–4.0)
MCH: 32.1 pg (ref 26.0–34.0)
MCHC: 33.2 g/dL (ref 30.0–36.0)
MCV: 96.5 fL (ref 80.0–100.0)
Monocytes Absolute: 0.3 10*3/uL (ref 0.1–1.0)
Monocytes Relative: 5 %
Neutro Abs: 5.4 10*3/uL (ref 1.7–7.7)
Neutrophils Relative %: 81 %
Platelets: 231 10*3/uL (ref 150–400)
RBC: 3.46 MIL/uL — ABNORMAL LOW (ref 3.87–5.11)
RDW: 19.1 % — ABNORMAL HIGH (ref 11.5–15.5)
WBC: 6.6 10*3/uL (ref 4.0–10.5)
nRBC: 0 % (ref 0.0–0.2)

## 2022-09-11 LAB — COMPREHENSIVE METABOLIC PANEL
ALT: 109 U/L — ABNORMAL HIGH (ref 0–44)
AST: 236 U/L — ABNORMAL HIGH (ref 15–41)
Albumin: 3.2 g/dL — ABNORMAL LOW (ref 3.5–5.0)
Alkaline Phosphatase: 221 U/L — ABNORMAL HIGH (ref 38–126)
Anion gap: 15 (ref 5–15)
BUN: 59 mg/dL — ABNORMAL HIGH (ref 8–23)
CO2: 26 mmol/L (ref 22–32)
Calcium: 8.1 mg/dL — ABNORMAL LOW (ref 8.9–10.3)
Chloride: 93 mmol/L — ABNORMAL LOW (ref 98–111)
Creatinine, Ser: 6.02 mg/dL — ABNORMAL HIGH (ref 0.44–1.00)
GFR, Estimated: 7 mL/min — ABNORMAL LOW (ref >60)
Glucose, Bld: 175 mg/dL — ABNORMAL HIGH (ref 70–99)
Potassium: 5.8 mmol/L — ABNORMAL HIGH (ref 3.5–5.1)
Sodium: 134 mmol/L — ABNORMAL LOW (ref 135–145)
Total Bilirubin: 1.2 mg/dL (ref 0.3–1.2)
Total Protein: 6.5 g/dL (ref 6.5–8.1)

## 2022-09-11 LAB — GLUCOSE, CAPILLARY
Glucose-Capillary: 119 mg/dL — ABNORMAL HIGH (ref 70–99)
Glucose-Capillary: 170 mg/dL — ABNORMAL HIGH (ref 70–99)
Glucose-Capillary: 181 mg/dL — ABNORMAL HIGH (ref 70–99)
Glucose-Capillary: 204 mg/dL — ABNORMAL HIGH (ref 70–99)
Glucose-Capillary: 119 mg/dL — ABNORMAL HIGH (ref 70–99)
Glucose-Capillary: 148 mg/dL — ABNORMAL HIGH (ref 70–99)

## 2022-09-11 LAB — PROCALCITONIN: Procalcitonin: 8.51 ng/mL

## 2022-09-11 LAB — FIBRINOGEN: Fibrinogen: 350 mg/dL (ref 210–475)

## 2022-09-11 LAB — C-REACTIVE PROTEIN: CRP: 8.2 mg/dL — ABNORMAL HIGH (ref <1.0)

## 2022-09-11 LAB — HEPATITIS B CORE ANTIBODY, TOTAL: Hep B Core Total Ab: NONREACTIVE

## 2022-09-11 IMAGING — CR DG CHEST 2V
2 series · 2 of 2 positions shown · non-contrast
Comparison: 01/22/2022

CLINICAL DATA: Shortness of breath

EXAM:
CHEST - 2 VIEW

[chest lat]
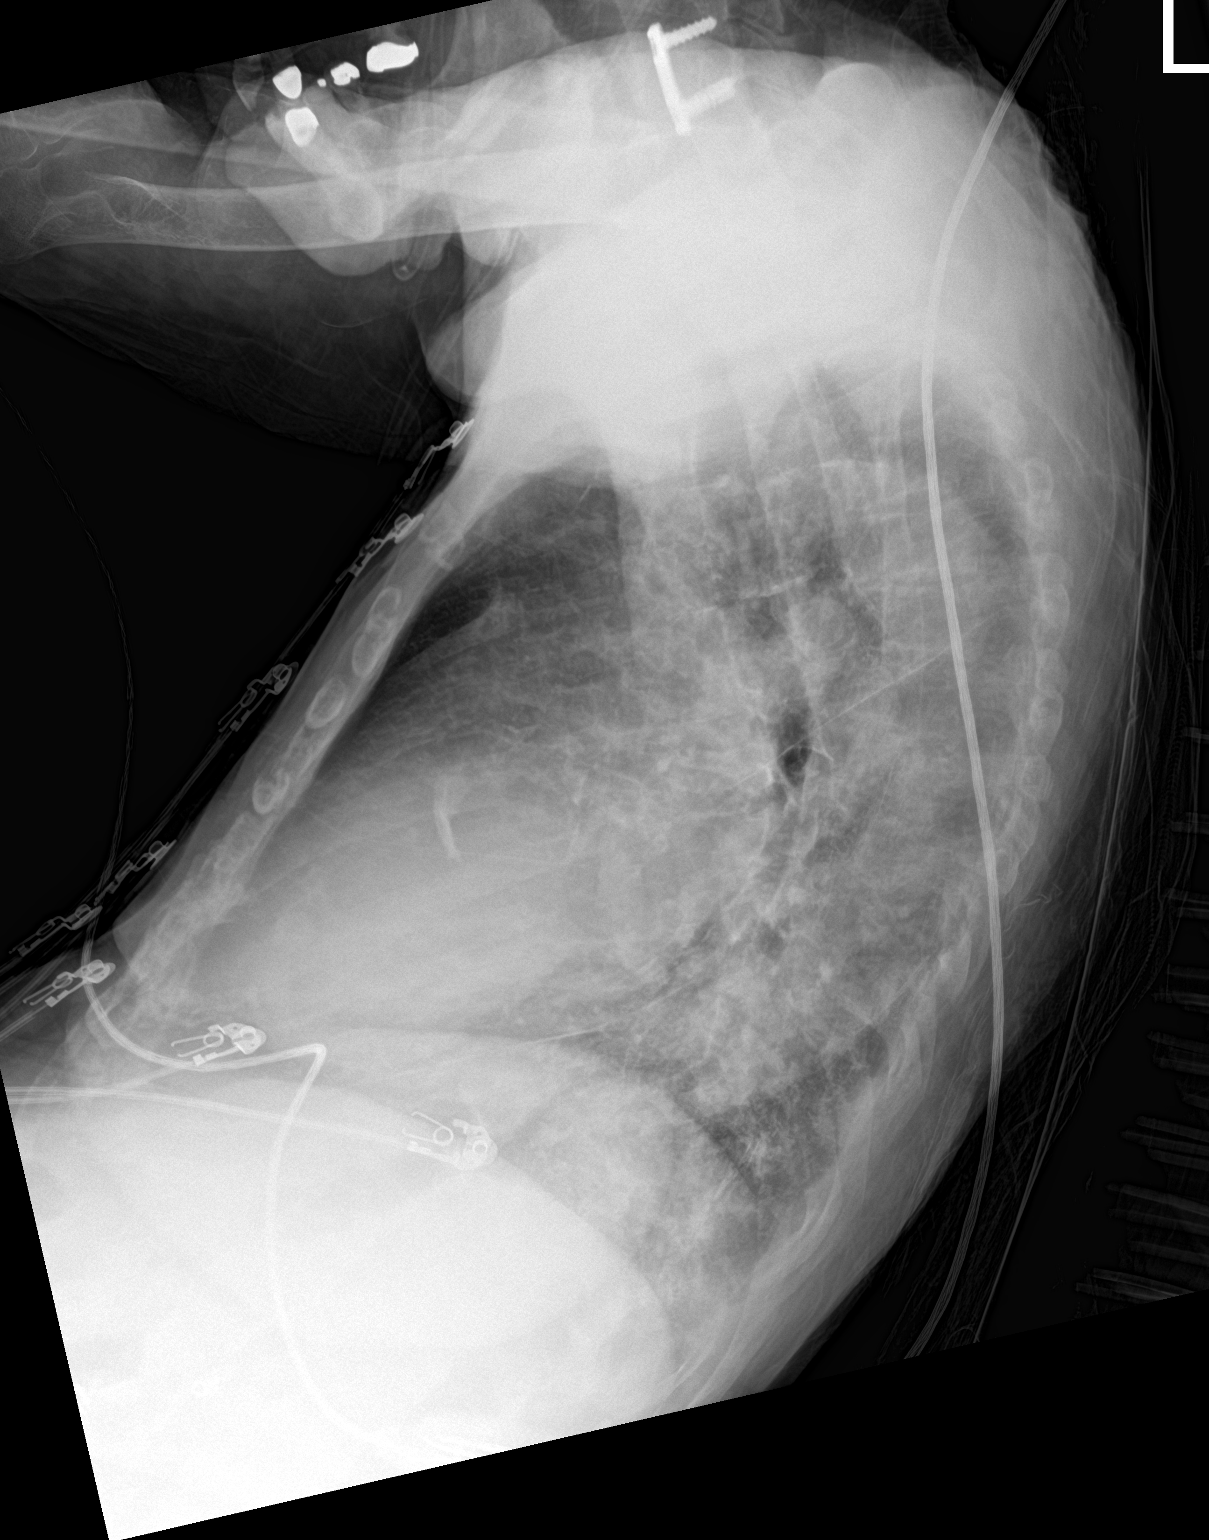

[chest ap]
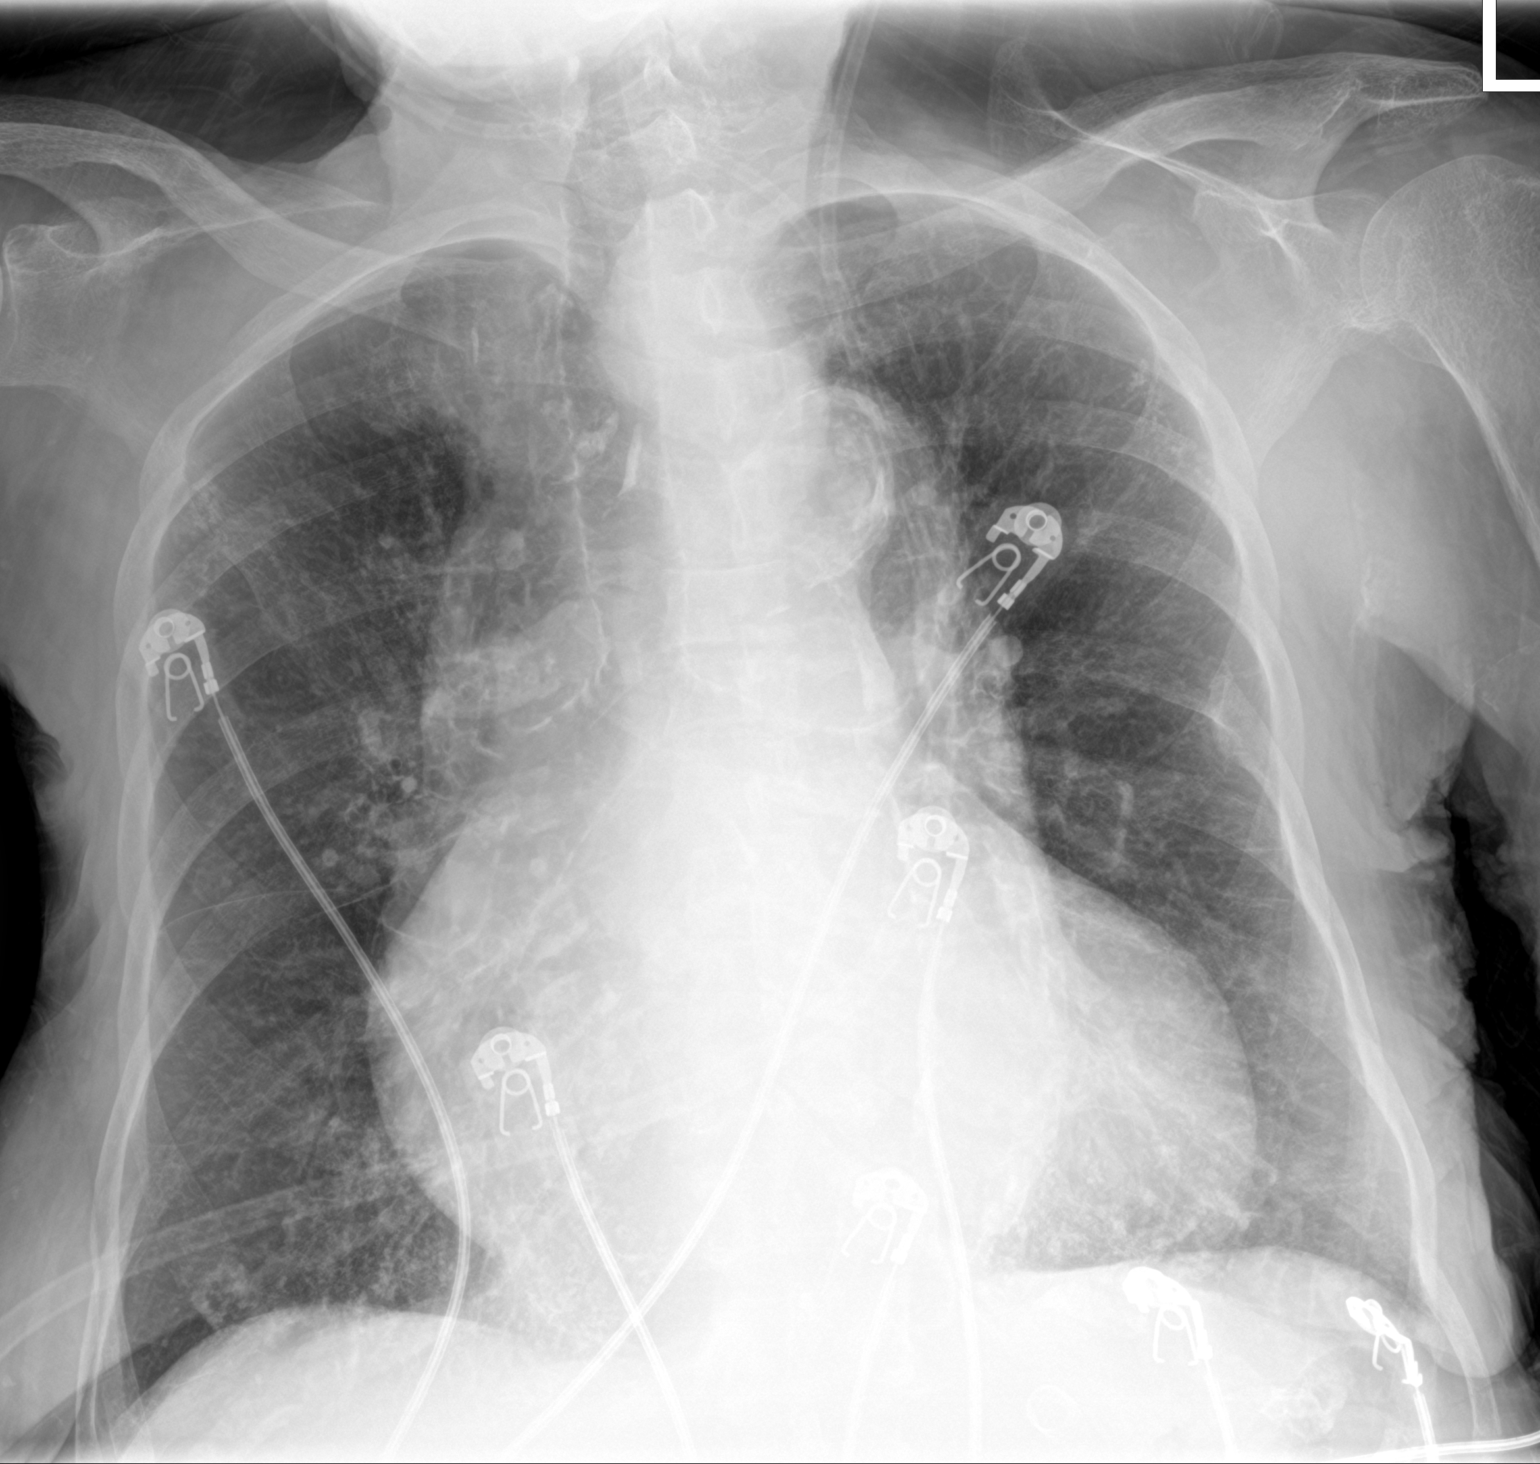

[2 of 2 positions shown; findings below may reference images not displayed]

FINDINGS: Cardiac shadow is enlarged but stable. Aortic calcifications are
again seen. Tortuous with left subclavian artery is noted indenting
the trachea. No focal infiltrate or sizable effusion is seen. No
acute bony abnormality is noted.
IMPRESSION: No acute abnormality seen.

## 2022-09-11 MED ORDER — TOCILIZUMAB 400 MG/20ML IV SOLN
8.0000 mg/kg | Freq: Once | INTRAVENOUS | Status: AC
  Administered 2022-09-11: 368 mg via INTRAVENOUS
  Filled 2022-09-11: qty 18.4

## 2022-09-11 MED ORDER — HEPARIN SODIUM (PORCINE) 5000 UNIT/ML IJ SOLN
5000.0000 [IU] | Freq: Three times a day (TID) | INTRAMUSCULAR | Status: DC
  Administered 2022-09-11 – 2022-09-17 (×17): 5000 [IU] via SUBCUTANEOUS
  Filled 2022-09-11 (×17): qty 1

## 2022-09-11 MED ORDER — GUAIFENESIN 100 MG/5ML PO LIQD
5.0000 mL | ORAL | Status: DC | PRN
  Administered 2022-09-11 – 2022-09-15 (×4): 5 mL via ORAL
  Filled 2022-09-11: qty 15
  Filled 2022-09-11 (×3): qty 10
  Filled 2022-09-11: qty 15
  Filled 2022-09-11 (×2): qty 10

## 2022-09-11 MED ORDER — DIPHENHYDRAMINE HCL 25 MG PO CAPS
25.0000 mg | ORAL_CAPSULE | Freq: Once | ORAL | Status: AC
  Administered 2022-09-11: 25 mg via ORAL
  Filled 2022-09-11: qty 1

## 2022-09-11 MED ORDER — DIPHENHYDRAMINE HCL 50 MG/ML IJ SOLN
12.5000 mg | Freq: Once | INTRAMUSCULAR | Status: AC
  Administered 2022-09-11: 12.5 mg via INTRAVENOUS
  Filled 2022-09-11: qty 1

## 2022-09-11 MED ORDER — SODIUM CHLORIDE 0.9 % IV SOLN: INTRAVENOUS | Status: DC | PRN

## 2022-09-11 MED ORDER — ALPRAZOLAM 0.25 MG PO TABS
0.2500 mg | ORAL_TABLET | Freq: Once | ORAL | Status: AC
  Administered 2022-09-11: 0.25 mg via ORAL
  Filled 2022-09-11: qty 1

## 2022-09-11 MED ORDER — HYDROXYZINE HCL 10 MG PO TABS
10.0000 mg | ORAL_TABLET | Freq: Three times a day (TID) | ORAL | Status: DC | PRN
  Administered 2022-09-12: 10 mg via ORAL
  Filled 2022-09-11 (×2): qty 1

## 2022-09-11 NOTE — Procedures (Signed)
Arterial Catheter Insertion Procedure Note  [name]  [id number]  Oct 24, 1951  Date:09/11/22  Time:2:08 AM    Provider Performing: Leigh Aurora, BS, RRT-ACCS, RCP   Procedure: Insertion of Arterial Line [phone]) without US guidance  Indication(s) Blood pressure monitoring and/or need for frequent ABGs  Consent Unable to obtain consent due to emergent nature of procedure.  Anesthesia None   Time Out Verified patient identification, verified procedure, site/side was marked, verified correct patient position, special equipment/implants available, medications/allergies/relevant history reviewed, required imaging and test results available.   Sterile Technique Maximal sterile technique including full sterile barrier drape, hand hygiene, sterile gown, sterile gloves, mask, hair covering, sterile ultrasound probe cover (if used).   Procedure Description Area of catheter insertion was cleaned with chlorhexidine and draped in sterile fashion. Without real-time ultrasound guidance an arterial catheter was placed into the left radial artery.  Appropriate arterial tracings confirmed on monitor.     Complications/Tolerance No complications noted, patient tolerated the procedure poorly. Kept moving and stating that it was painful.    EBL Minimal   Left radial artery was cannulated. Catheter was placed. Good blood flow and line flushes without complications.  [name], BS, RRT-ACCS, RCP

## 2022-09-11 NOTE — Progress Notes (Addendum)
[facility] Progress Note   Assessment/ Plan:   OP HD orders:  Dialyzes at [facility], MWF, 4 hours, 2k 2ca, edw 44.1kg Access; RUE AVG Mircera 30 mcg on 8/11, last Hb 11.3 Hectorol 5 mcg w/each HD Sensipar 30 mg with HD.  Sevelamer 800 mg 2 tabs with each meal.   #Sepsis in the setting of COVID-19 positive: Chest x-ray with no acute finding.  Received vancomycin, cefepime and Flagyl.  Pending blood culture result. CTA no PE, some tree-in-bud opacities and medistinal LAD.  Getting actemra as well.   # ESRD: MWF.  Last treatment yesterday, she did not complete full treatment.  She has some chronic hyperkalemia, volume acceptable.  We will plan for next HD tomorrow.  does not have a lot of reserve in terms of volume tolerance so will try to keep on schedule today.     #Chronic hyperkalemia in ESRD patient: Lokelma and HD.  Plan for HD today   # Hypotension: on midodrine as OP   # Anemia of ESRD: Hemoglobin above goal.  Last dose of ESA back in August.   # Metabolic Bone Disease/hyperphosphatemia: Resume sevelamer.  Monitor lab  Subjective:    Seen in room.  On 3 mcg /min levophed and coming down.  Awake and alert, eating breakfast   Objective:   BP (!) 149/55   Pulse 67   Temp 97.9 F (36.6 C) (Oral)   Resp 15   Ht '5\' 3"'$  (1.6 m)   Wt 46.1 kg   SpO2 97%   BMI 18.00 kg/m   Physical Exam: Gen:NAD, sitting in bed CVS: RRR Resp: bibasilar crackles Abd: soft Ext: trace LE edema ACCESS: R AVF + T/B, L old AVG not usable  Labs: BMET Recent Labs  Lab 09/07/22 0407 09/07/22 0424 09/10/22 1353 09/10/22 1918 09/11/22 0401  NA 134* 129* 139 135 134*  K 6.1* 5.8* 5.9* 5.2* 5.8*  CL 92*  --  94*  --  93*  CO2 22  --  24  --  26  GLUCOSE 80  --  95  --  175*  BUN 78*  --  47*  --  59*  CREATININE 5.78*  --  5.61*  --  6.02*  CALCIUM 8.5*  --  8.2*  --  8.1*   CBC Recent Labs  Lab 09/07/22 0407 09/07/22 0424 09/10/22 1353 09/10/22 1918 09/11/22 0401   WBC 5.8  --  8.0  --  6.6  NEUTROABS 3.0  --  5.7  --  5.4  HGB 11.1* 11.6* 12.4 10.2* 11.1*  HCT 34.3* 34.0* 40.0 30.0* 33.4*  MCV 97.2  --  102.6*  --  96.5  PLT 195  --  183  --  231      Medications:     atorvastatin  80 mg Oral Daily   Chlorhexidine Gluconate Cloth  6 each Topical Daily   clopidogrel  75 mg Oral Daily   dexamethasone  6 mg Oral Daily   heparin injection (subcutaneous)  5,000 Units Subcutaneous Q8H   insulin aspart  0-9 Units Subcutaneous Q4H   midodrine  15 mg Oral TID WC   pantoprazole  40 mg Oral Daily   sevelamer carbonate  800 mg Oral TID WC     Madelon Lips MD 09/11/2022, 11:57 AM

## 2022-09-11 NOTE — Progress Notes (Addendum)
NAME:  [name], MRN:  [id number], DOB:  07-16-1951, LOS: 1 ADMISSION DATE:  09/10/2022, CONSULTATION DATE:  9/28 REFERRING MD:  Dr. Rogers Blocker, CHIEF COMPLAINT:  Hypotension/Shock   History of Present Illness:  [name] is a 71 y/o woman with a history of ESRD on HD MWF, chronic diastolic CHF (LVEF 10-25%, G2DD), HLD, TIA who presented to the hospital from SNF with SOB, diarrhea, nausea for the past few days, and positive for COVID-19 on admission. Presented to the ED with hypoxia at 86%, placed on 2L [place], requiring maximum of 6L [place]. Sepsis protocol initiated in ED. She received a 500cc IVF bolus and was started on empiric antibiotics (Cefepime, Vanc, Flagyl) for presumed bacteremia after 1 blood culture was obtained. Unfortunately after her fluid bolus her BP dropped and CCM was consulted for hypotension and hypoxia. Last HD was 9/27,  but lasted only for 2 hours.  Initial labs showed lactic acidosis to 4.7, hyperkalemia to 5.9, mild transaminitis AST/ALT 108/49. Nephrology consulted.   Of note, follows outpatient with ID for hx MSSA bacteremia and C diff in 04/2022. Pertinent  Medical History  ESRD on HD HFrEF CAD GERD HTN> chronic hypotension on midodrine now HLD PAD, critical limb ischemia  Significant Hospital Events: Including procedures, antibiotic start and stop dates in addition to other pertinent events   9/28 Admitted. PCCM consulted. Nephrology consulted. Started on IV Vancomycin, Cefepime, Flagl. CTA PE negative, but with RUL and RLL micro-nodules and opacities that are likely infectious/inflammatory. CXR without sign of pneumonia or pulmonary edema.  Interim History / Subjective:  No acute overnight events. Afebrile this morning.  This morning, only complaint is rectal pressure/pain. No abdominal pain, chest pain. Denies blood per rectum. Reports shortness of breath and dry cough without sputum. Would like something for her cough.   Objective   Blood pressure (!)  114/58, pulse 68, temperature 97.9 F (36.6 C), temperature source Oral, resp. rate 12, height 5' 3"  (1.6 m), weight 46.1 kg, SpO2 97 %.        Intake/Output Summary (Last 24 hours) at 09/11/2022 0755 Last data filed at 09/10/2022 2100 Gross per 24 hour  Intake 1098.91 ml  Output --  Net 1098.91 ml   Filed Weights   09/10/22 1225  Weight: 46.1 kg    Labs Na 134 K+ 5.8 Glucose 175 AST 236 ALT 109 Alk phos 221 WBC 6.6 Hgb 11.1  VBG 7.36, pCO2 51, pO2 31  Lines: 2 L PIV AV fistula R upper arm Left radial arterial line  Examination: General: Chronically ill appearing elderly woman laying in bed in no distress HENT: Pupils PERRLA, anicteric Lungs: Coarse breath sounds, no wheezing or crackles. Dry cough on exam. Cardiovascular: RRR Abdomen: Soft, NTND Extremities: Warm, well-perfused with no edema. No open sores. Neuro: Awake, alert, oriented x4.  Resolved Hospital Problem list     Assessment & Plan:  ENIDP-82 infection with acute hypoxic respiratory failure Shock, likely septic in setting of respiratory illness Stable on 5 L [place]. Lungs generally with good air movement but coarse breath sounds. Able to speak in full sentences.  Given co-morbidities and acuity of illness including hypotension requiring vasopressors, will start monoclonal antibodies and obtain inflammatory markers. Still on norepinephrine at 8 mcg/hr, increased from 6 mcg/hr overnight. - Start Tocilizumab today; monitor LFTs daily - Continue Decadron 6 mg daily x 10 days - Zofran PRN q8h PRN - Albuterol 1-2 puffs q6h PRN - F/u CRP, LDH, Fibrinogen  Shock, likely septic in  setting of COVID-19 CXR without overt signs of pneumonia. No urine culture obtained, patient without urine output. No other source seen on exam. MAPs > 65. Hx of chronic hypotension. Awake, alert, well-perfused on exam this morning. Procal elevated to 8.51. Negative MRSA PCR. Lactic acid downtrending 4.7 -> 2.9. - Continue   Cefepime - D/c Vancomycin - Continue midodrine 3m TID - Norepi to maintain MAP >65; wean as tolerated - Follow blood cultures  Chronic diastolic CHF CAD without angina B-blocker d/c'ed in the past. No sign of volume overload on exam although BNP elevated. - Atorvostatin 40 mg daily  ESRD; hx transplant K+ 5.8 this morning. BUN 59. May plan for HD today, per nephro. - Nephrology following - Phos binders - Plavix for HD cath  Transaminitis Increasing, likely in setting of shock. - Monitor daily  GOC: Desires full code, prior DNR in 5/2-23. Surrogate decision maker is son. Needs palliative discussion.  Best Practice (right click and "Reselect all SmartList Selections" daily)   Diet/type: Regular consistency (see orders) DVT prophylaxis: prophylactic heparin  GI prophylaxis: PPI Lines: Arterial Line Foley:  N/A Code Status:  full code Last date of multidisciplinary goals of care discussion [need to discuss w/ son]  Labs   CBC: Recent Labs  Lab 09/07/22 0407 09/07/22 0424 09/10/22 1353 09/10/22 1918 09/11/22 0401  WBC 5.8  --  8.0  --  6.6  NEUTROABS 3.0  --  5.7  --  5.4  HGB 11.1* 11.6* 12.4 10.2* 11.1*  HCT 34.3* 34.0* 40.0 30.0* 33.4*  MCV 97.2  --  102.6*  --  96.5  PLT 195  --  183  --  2161   Basic Metabolic Panel: Recent Labs  Lab 09/07/22 0407 09/07/22 0424 09/10/22 1353 09/10/22 1900 09/10/22 1918 09/11/22 0401  NA 134* 129* 139  --  135 134*  K 6.1* 5.8* 5.9*  --  5.2* 5.8*  CL 92*  --  94*  --   --  93*  CO2 22  --  24  --   --  26  GLUCOSE 80  --  95  --   --  175*  BUN 78*  --  47*  --   --  59*  CREATININE 5.78*  --  5.61*  --   --  6.02*  CALCIUM 8.5*  --  8.2*  --   --  8.1*  MG  --   --   --  2.0  --   --    GFR: Estimated Creatinine Clearance: 6.1 mL/min (A) (by C-G formula based on SCr of 6.02 mg/dL (H)). Recent Labs  Lab 09/07/22 0407 09/10/22 1353 09/10/22 1357 09/10/22 1900 09/10/22 2205 09/11/22 0401  PROCALCITON   --   --   --  8.36 8.51  --   WBC 5.8 8.0  --   --   --  6.6  LATICACIDVEN  --   --  4.7* 2.9*  --   --     Liver Function Tests: Recent Labs  Lab 09/07/22 0407 09/10/22 1353 09/11/22 0401  AST 44* 108* 236*  ALT 28 49* 109*  ALKPHOS 206* 252* 221*  BILITOT 0.7 1.6* 1.2  PROT 6.6 7.5 6.5  ALBUMIN 3.2* 3.3* 3.2*   Recent Labs  Lab 09/07/22 0407  LIPASE 49   No results for input(s): "AMMONIA" in the last 168 hours.  ABG    Component Value Date/Time   PHART 7.361 04/27/2022 1941   PCO2ART 42.2 04/27/2022  [place] (H) 04/27/2022 1941   HCO3 28.7 (H) 09/10/2022 2159   TCO2 31 09/10/2022 1918   ACIDBASEDEF 2.0 04/27/2022 1941   O2SAT 43.7 09/10/2022 2159     Coagulation Profile: Recent Labs  Lab 09/10/22 1353  INR 1.8*    Cardiac Enzymes: No results for input(s): "CKTOTAL", "CKMB", "CKMBINDEX", "TROPONINI" in the last 168 hours.  HbA1C: Hgb A1c MFr Bld  Date/Time Value Ref Range Status  09/10/2022 07:00 PM 5.5 4.8 - 5.6 % Final    Comment:    (NOTE) Pre diabetes:          5.7%-6.4%  Diabetes:              >6.4%  Glycemic control for   <7.0% adults with diabetes   02/22/2021 02:49 AM 5.0 4.8 - 5.6 % Final    Comment:    (NOTE) Pre diabetes:          5.7%-6.4%  Diabetes:              >6.4%  Glycemic control for   <7.0% adults with diabetes     CBG: Recent Labs  Lab 09/10/22 2004 09/10/22 2339 09/11/22 0400  GLUCAP 110* 160* 170*    Review of Systems:   No chills, chest pain, abdominal pain, diarrhea, vomiting. Endorses cough, shortness of breath, rectal pain.  Past Medical History:  She,  has a past medical history of Acute ischemic stroke ([place]) (02/23/2017), Acute on chronic diastolic CHF (congestive heart failure) ([place]), Adenomatous polyp of colon (10/2010, 2006, 2015), Anemia in CKD (chronic kidney disease) (11/07/2012), Arthritis, CAD (coronary artery disease), Critical limb ischemia of left lower extremity with ulceration of  foot ([place]) (03/17/2022), Depression with anxiety, Diverticulitis of colon with perforation (08/06/2021), ESRD (end stage renal disease) ([place]) (11/07/2012), GERD (gastroesophageal reflux disease), GI bleed (2017), Hyperlipidemia, Hypertension, Neuromuscular disorder ([place]), Osteoporosis, and Pseudoaneurysm of surgical AV fistula ([place]).   Surgical History:   Past Surgical History:  Procedure Laterality Date   ABDOMINAL AORTOGRAM W/LOWER EXTREMITY N/A 03/26/2022   Procedure: ABDOMINAL AORTOGRAM W/LOWER EXTREMITY;  Surgeon: Marty Heck, MD;  Location: [facility];  Service: Cardiovascular;  Laterality: N/A;  Left   AV FISTULA PLACEMENT     for dialysis   AV FISTULA PLACEMENT Left 11/22/2015   Procedure: ARTERIOVENOUS (AV) FISTULA CREATION-LEFT BRACHIOCEPHALIC;  Surgeon: Serafina Mitchell, MD;  Location: [facility];  Service: Vascular;  Laterality: Left;   AV FISTULA PLACEMENT Right 03/15/2020   Procedure: INSERTION OF ARTERIOVENOUS (AV) GORE-TEX GRAFT ARM ( BRACHIAL AXILLARY );  Surgeon: Katha Cabal, MD;  Location: [facility];  Service: Vascular;  Laterality: Right;   AV FISTULA PLACEMENT Right 05/15/2022   Procedure: EXPLORATION  OF  RIGHT ARM ARTERIOVENOUS GORE-TEX GRAFT;  Surgeon: Angelia Mould, MD;  Location: [facility];  Service: Vascular;  Laterality: Right;   [place] REMOVAL Right 04/27/2022   Procedure: REVISION OF RIGHT ARTERIOVENOUS GRAFT AND EXCISION OF RIGHT DEGENERATED GRAFT (AVG);  Surgeon: Angelia Mould, MD;  Location: [facility];  Service: Vascular;  Laterality: Right;   BACK SURGERY     CERVICAL FUSION     CHOLECYSTECTOMY  12/02/2012   Procedure: LAPAROSCOPIC CHOLECYSTECTOMY WITH INTRAOPERATIVE CHOLANGIOGRAM;  Surgeon: Edward Jolly, MD;  Location: [facility];  Service: General;  Laterality: N/A;   EYE SURGERY Bilateral    cataract surgery   EYE SURGERY Left 2019   laser   HEMATOMA EVACUATION Left 12/24/2016   Procedure: EVACUATION HEMATOMA  LEFT UPPER ARM;  Surgeon:  Waynetta Sandy, MD;  Location: [facility];  Service: Vascular;  Laterality: Left;   I & D EXTREMITY Left 12/31/2016   Procedure: IRRIGATION AND DEBRIDEMENT EXTREMITY;  Surgeon: Angelia Mould, MD;  Location: [facility];  Service: Vascular;  Laterality: Left;   INSERTION OF DIALYSIS CATHETER Right 12/24/2016   Procedure: INSERTION OF DIALYSIS CATHETER;  Surgeon: Waynetta Sandy, MD;  Location: [facility];  Service: Vascular;  Laterality: Right;   INSERTION OF DIALYSIS CATHETER Right 02/04/2017   Procedure: INSERTION OF DIALYSIS CATHETER;  Surgeon: Waynetta Sandy, MD;  Location: [facility];  Service: Vascular;  Laterality: Right;   [place] Left 10/23/2016   Procedure: Fistulagram;  Surgeon: Elam Dutch, MD;  Location: [facility];  Service: Cardiovascular;  Laterality: Left;   PERIPHERAL VASCULAR INTERVENTION  03/26/2022   Procedure: PERIPHERAL VASCULAR INTERVENTION;  Surgeon: Marty Heck, MD;  Location: [facility];  Service: Cardiovascular;;  left sfa   PERIPHERAL VASCULAR THROMBECTOMY Right 04/16/2020   Procedure: PERIPHERAL VASCULAR THROMBECTOMY;  Surgeon: Katha Cabal, MD;  Location: [facility];  Service: Cardiovascular;  Laterality: Right;   RESECTION OF ARTERIOVENOUS FISTULA ANEURYSM Left 11/22/2015   Procedure: RESECTION OF LEFT RADIOCEPHALIC FISTULA ANEURYSM ;  Surgeon: Serafina Mitchell, MD;  Location: [facility];  Service: Vascular;  Laterality: Left;   REVISON OF ARTERIOVENOUS FISTULA Left 12/22/2016   Procedure: REVISON OF LEFT ARTERIOVENOUS FISTULA;  Surgeon: Waynetta Sandy, MD;  Location: [facility];  Service: Vascular;  Laterality: Left;   REVISON OF ARTERIOVENOUS FISTULA Left 02/04/2017   Procedure: REVISON OF LEFT UPPER ARM ARTERIOVENOUS FISTULA;  Surgeon: Waynetta Sandy, MD;  Location: [facility];  Service: Vascular;  Laterality: Left;   UPPER EXTREMITY ANGIOGRAPHY Bilateral  09/19/2019   Procedure: UPPER EXTREMITY ANGIOGRAPHY;  Surgeon: Katha Cabal, MD;  Location: [facility];  Service: Cardiovascular;  Laterality: Bilateral;     Social History:   reports that she quit smoking about 30 years ago. Her smoking use included cigarettes. She has never been exposed to tobacco smoke. She has never used smokeless tobacco. She reports that she does not drink alcohol and does not use drugs.   Family History:  Her family history includes Cancer in her brother; Colon cancer in her brother; Coronary artery disease (age of onset: 48) in her mother; Hyperlipidemia in her mother; Hypertension in her mother; Stroke in her maternal aunt. There is no history of Esophageal cancer, Stomach cancer, or Rectal cancer.   Allergies Allergies  Allergen Reactions   Sulfa Antibiotics Other (See Comments)    Per patient, both parents allergic-so will not take   Adhesive [Tape] Itching     Home Medications  Prior to Admission medications   Medication Sig Start Date End Date Taking? Authorizing Provider  acetaminophen (TYLENOL) 500 MG tablet Take 1 tablet (500 mg total) by mouth every 6 (six) hours as needed. Patient taking differently: Take 500 mg by mouth every 6 (six) hours as needed (pain). 09/05/18   Law, Bea Graff, PA-C  albuterol (PROVENTIL) (2.5 MG/3ML) 0.083% nebulizer solution Take 3 mLs (2.5 mg total) by nebulization every 4 (four) hours as needed for wheezing. Patient taking differently: Take 2.5 mg by nebulization every 6 (six) hours as needed for wheezing. 05/04/22   Elgergawy, Silver Huguenin, MD  albuterol (VENTOLIN HFA) 108 (90 Base) MCG/ACT inhaler Inhale 1-2 puffs into the  lungs every 6 (six) hours as needed for wheezing or shortness of breath. Patient taking differently: Inhale 2 puffs into the lungs every 6 (six) hours as needed for shortness of breath. 01/11/22   Mesner, Corene Cornea, MD  atorvastatin (LIPITOR) 80 MG tablet Take 80 mg by mouth at bedtime. 03/25/22    [provider]  B Complex-C-Folic Acid (DIALYVITE 479) 0.8 MG WAFR Take 1 tablet by mouth every morning. 01/28/22   [provider]  clopidogrel (PLAVIX) 75 MG tablet Take 1 tablet (75 mg total) by mouth daily. Patient taking differently: Take 75 mg by mouth at bedtime. 03/26/22 03/26/23  Marty Heck, MD  doxycycline (VIBRA-TABS) 100 MG tablet Take 1 tablet (100 mg total) by mouth 2 (two) times daily. 07/02/22   Laurice Record, MD  escitalopram (LEXAPRO) 10 MG tablet Take 10 mg by mouth every morning. 03/25/22   [provider]  hydrOXYzine (ATARAX) 25 MG tablet Take 1 tablet (25 mg total) by mouth every 8 (eight) hours as needed for itching. 05/21/22   Mercy Riding, MD  loperamide (IMODIUM) 2 MG capsule Take 1 capsule (2 mg total) by mouth 4 (four) times daily as needed for diarrhea or loose stools. 09/07/22   Redwine, Madison A, PA-C  midodrine (PROAMATINE) 10 MG tablet Take 1 tablet (10 mg total) by mouth 3 (three) times daily with meals. 05/21/22   Mercy Riding, MD  Nutritional Supplements (NEPRO) LIQD Take 237 mLs by mouth 2 (two) times daily between meals.    [provider]  ondansetron (ZOFRAN) 8 MG tablet Take 8 mg by mouth every 12 (twelve) hours as needed for nausea or vomiting.    [provider]  OVER THE COUNTER MEDICATION Take 1 tablet by mouth every morning. Folic acid - vitamin B complex - vitamin C - selenium - zinc 3 mg    [provider]  pantoprazole (PROTONIX) 40 MG tablet Take 1 tablet (40 mg total) by mouth daily. 08/18/21   Nita Sells, MD  sevelamer (RENAGEL) 800 MG tablet Take 1,600 mg by mouth 3 (three) times daily with meals. 03/24/22   [provider]  zolpidem (AMBIEN) 5 MG tablet Take 1 tablet (5 mg total) by mouth at bedtime as needed for up to 10 days for sleep (Insomnia). 05/21/22 05/31/22  Mercy Riding, MD

## 2022-09-11 NOTE — TOC Progression Note (Signed)
Transition of Care [facility]) - Initial/Assessment Note    Patient Details  Name: [name] MRN: [id number] Date of Birth: Aug 20, 1951  [facility] of [facility]) CM/SW Contact:    Milinda Antis, [place] Phone Number: 09/11/2022, 3:45 PM  Clinical Narrative:                 Transition of Care Department [facility]) has reviewed patient.  Patient is COVID+ from home and receives dialysis on MWF @ [facility].  We will continue to monitor patient advancement through interdisciplinary progression rounds.   If new patient transition needs arise, please place a TOC consult.          Patient Goals and CMS Choice        Expected Discharge Plan and Services                                                Prior Living Arrangements/Services                       Activities of Daily Living      Permission Sought/Granted                  Emotional Assessment              Admission diagnosis:  ESRD on dialysis ([place]) [N18.6, Z99.2] SIRS (systemic inflammatory response syndrome) ([place]) [R65.10] Acute respiratory failure with hypoxia ([place]) [J96.01] Septic shock ([place]) [A41.9, R65.21] Hypotension, unspecified hypotension type [I95.9] Patient Active Problem List   Diagnosis Date Noted   Transaminitis 09/10/2022   Dermatitis 05/10/2022   Physical deconditioning 05/07/2022   Hypervolemia associated with renal insufficiency 05/06/2022   DNR (do not resuscitate) 05/06/2022   Septic shock vs. hypovolemic shock in setting of covid 19     MSSA bacteremia    Vascular graft infection  and MSSA bacteremia 04/26/2022   Arteriovenous fistula, acquired ([place]) 78/29/5621   Cyclical vomiting syndrome 02/19/2022   Dysphagia 02/19/2022   Anemia of chronic renal failure 02/19/2022   Hypertensive heart and chronic kidney disease with heart failure and with stage 5 chronic kidney disease, or end stage renal disease ([place]) 02/19/2022   Mild cognitive impairment 02/19/2022    Major depression, single episode 02/19/2022   Fall at home, initial encounter 01/23/2022   Acute on chronic diastolic CHF 30/86/5784   GERD (gastroesophageal reflux disease)    Other disorders of calcium metabolism 10/17/2021   Protein-calorie malnutrition, severe 08/12/2021   Acute respiratory failure with hypoxia ([place]) 08/06/2021   Major depressive disorder, recurrent episode, moderate (HCC) 02/23/2021   Prolonged QT interval 02/21/2021   Allergy, unspecified, initial encounter 08/08/2020   NSTEMI 05/14/2020   Hypotension 03/16/2020   Ventricular tachycardia ([place]) 03/15/2020   Seizure ([place]) 69/62/9528   Complication of vascular access for dialysis 12/21/2019   Breakdown (mechanical) of surgically created arteriovenous fistula, initial encounter ([place]) 12/18/2019   Weakness 10/11/2019   Aneurysm artery, 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 difficile diarrhea 04/17/2019   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   Primary generalized (osteo)arthritis 04/17/2019   Pure hypercholesterolemia, unspecified 04/17/2019  Secondary hyperparathyroidism of renal origin ([place]) 04/17/2019   Gastro-esophageal reflux disease without esophagitis 04/17/2019   Essential (primary) hypertension 04/17/2019   Transient cerebral ischemic attack, unspecified 04/17/2019   Prolonged Q-T interval on ECG 02/24/2017   Malnutrition of moderate degree 12/24/2016   Problem with dialysis access [facility]) 12/21/2016   Neurologic abnormality 11/19/2015   Anxiety 11/19/2015   Insomnia 11/19/2015   Gait instability    Dizziness 05/09/2015   Ataxia 05/09/2015   H/O: CVA (cerebrovascular accident) 05/09/2015   Left facial numbness 05/09/2015    Left leg numbness 05/09/2015   Hyperlipidemia    SOB (shortness of breath) 04/01/2013   ESRD on dialysis ([place]) 11/07/2012   Dyspnea 12/31/2011   PCP:  [name], MD Pharmacy:   [facility] [address], [place] [address] Phone: [phone] Fax: [place], [place] [address] [place] [address] Phone: [phone] Fax: [phone]     Social Determinants of Health ([place]) Interventions    Readmission Risk Interventions    11/11/2021    3:49 PM 08/18/2021    3:01 PM  Readmission Risk Prevention Plan  Transportation Screening Complete Complete  Medication Review (RN Care Manager) Complete Complete  PCP or Specialist appointment within 3-5 days of discharge Complete Complete  HRI or Home Care Consult Complete Complete  SW Recovery Care/Counseling Consult Complete Complete  Palliative Care Screening Not Applicable Not Applicable  Skilled Nursing Facility Complete Complete

## 2022-09-11 NOTE — Telephone Encounter (Signed)
Received fax from Dr Hollie Salk requesting ligation of L AVF due to pt having pain and numbness in left arm. Will give to [facility]

## 2022-09-12 DIAGNOSIS — N186 End stage renal disease: Secondary | ICD-10-CM

## 2022-09-12 DIAGNOSIS — Z992 Dependence on renal dialysis: Secondary | ICD-10-CM | POA: Diagnosis not present

## 2022-09-12 DIAGNOSIS — T8612 Kidney transplant failure: Secondary | ICD-10-CM | POA: Diagnosis not present

## 2022-09-12 DIAGNOSIS — J9601 Acute respiratory failure with hypoxia: Secondary | ICD-10-CM

## 2022-09-12 DIAGNOSIS — Z7189 Other specified counseling: Secondary | ICD-10-CM

## 2022-09-12 DIAGNOSIS — Z515 Encounter for palliative care: Secondary | ICD-10-CM

## 2022-09-12 DIAGNOSIS — A419 Sepsis, unspecified organism: Secondary | ICD-10-CM | POA: Diagnosis not present

## 2022-09-12 DIAGNOSIS — U071 COVID-19: Secondary | ICD-10-CM | POA: Diagnosis not present

## 2022-09-12 DIAGNOSIS — R6521 Severe sepsis with septic shock: Secondary | ICD-10-CM | POA: Diagnosis not present

## 2022-09-12 LAB — COMPREHENSIVE METABOLIC PANEL
ALT: 219 U/L — ABNORMAL HIGH (ref 0–44)
AST: 453 U/L — ABNORMAL HIGH (ref 15–41)
Albumin: 2.9 g/dL — ABNORMAL LOW (ref 3.5–5.0)
Alkaline Phosphatase: 195 U/L — ABNORMAL HIGH (ref 38–126)
Anion gap: 21 — ABNORMAL HIGH (ref 5–15)
BUN: 81 mg/dL — ABNORMAL HIGH (ref 8–23)
CO2: 20 mmol/L — ABNORMAL LOW (ref 22–32)
Calcium: 8.4 mg/dL — ABNORMAL LOW (ref 8.9–10.3)
Chloride: 93 mmol/L — ABNORMAL LOW (ref 98–111)
Creatinine, Ser: 7.02 mg/dL — ABNORMAL HIGH (ref 0.44–1.00)
GFR, Estimated: 6 mL/min — ABNORMAL LOW (ref >60)
Glucose, Bld: 160 mg/dL — ABNORMAL HIGH (ref 70–99)
Potassium: 5.8 mmol/L — ABNORMAL HIGH (ref 3.5–5.1)
Sodium: 134 mmol/L — ABNORMAL LOW (ref 135–145)
Total Bilirubin: 1 mg/dL (ref 0.3–1.2)
Total Protein: 6.3 g/dL — ABNORMAL LOW (ref 6.5–8.1)

## 2022-09-12 LAB — RENAL FUNCTION PANEL
Albumin: 2.9 g/dL — ABNORMAL LOW (ref 3.5–5.0)
Anion gap: 14 (ref 5–15)
BUN: 28 mg/dL — ABNORMAL HIGH (ref 8–23)
CO2: 25 mmol/L (ref 22–32)
Calcium: 7.7 mg/dL — ABNORMAL LOW (ref 8.9–10.3)
Chloride: 94 mmol/L — ABNORMAL LOW (ref 98–111)
Creatinine, Ser: 3.18 mg/dL — ABNORMAL HIGH (ref 0.44–1.00)
GFR, Estimated: 15 mL/min — ABNORMAL LOW (ref >60)
Glucose, Bld: 135 mg/dL — ABNORMAL HIGH (ref 70–99)
Phosphorus: 4.5 mg/dL (ref 2.5–4.6)
Potassium: 3.9 mmol/L (ref 3.5–5.1)
Sodium: 133 mmol/L — ABNORMAL LOW (ref 135–145)

## 2022-09-12 LAB — CBC WITH DIFFERENTIAL/PLATELET
Abs Immature Granulocytes: 0.06 10*3/uL (ref 0.00–0.07)
Basophils Absolute: 0 10*3/uL (ref 0.0–0.1)
Basophils Relative: 0 %
Eosinophils Absolute: 0 10*3/uL (ref 0.0–0.5)
Eosinophils Relative: 0 %
HCT: 32.2 % — ABNORMAL LOW (ref 36.0–46.0)
Hemoglobin: 11.1 g/dL — ABNORMAL LOW (ref 12.0–15.0)
Immature Granulocytes: 1 %
Lymphocytes Relative: 10 %
Lymphs Abs: 0.9 10*3/uL (ref 0.7–4.0)
MCH: 32.2 pg (ref 26.0–34.0)
MCHC: 34.5 g/dL (ref 30.0–36.0)
MCV: 93.3 fL (ref 80.0–100.0)
Monocytes Absolute: 0.4 10*3/uL (ref 0.1–1.0)
Monocytes Relative: 5 %
Neutro Abs: 7.4 10*3/uL (ref 1.7–7.7)
Neutrophils Relative %: 84 %
Platelets: 231 10*3/uL (ref 150–400)
RBC: 3.45 MIL/uL — ABNORMAL LOW (ref 3.87–5.11)
RDW: 18.9 % — ABNORMAL HIGH (ref 11.5–15.5)
WBC: 8.9 10*3/uL (ref 4.0–10.5)
nRBC: 0.5 % — ABNORMAL HIGH (ref 0.0–0.2)

## 2022-09-12 LAB — GLUCOSE, CAPILLARY
Glucose-Capillary: 121 mg/dL — ABNORMAL HIGH (ref 70–99)
Glucose-Capillary: 127 mg/dL — ABNORMAL HIGH (ref 70–99)
Glucose-Capillary: 141 mg/dL — ABNORMAL HIGH (ref 70–99)
Glucose-Capillary: 142 mg/dL — ABNORMAL HIGH (ref 70–99)
Glucose-Capillary: 147 mg/dL — ABNORMAL HIGH (ref 70–99)
Glucose-Capillary: 153 mg/dL — ABNORMAL HIGH (ref 70–99)

## 2022-09-12 LAB — HEPATITIS C ANTIBODY: HCV Ab: NONREACTIVE — AB

## 2022-09-12 LAB — HEPATITIS B SURFACE ANTIBODY, QUANTITATIVE: Hep B S AB Quant (Post): >1000 m[IU]/mL (ref >9.9)

## 2022-09-12 LAB — PHOSPHORUS: Phosphorus: 8.3 mg/dL — ABNORMAL HIGH (ref 2.5–4.6)

## 2022-09-12 LAB — HEPATITIS B SURFACE ANTIBODY,QUALITATIVE: Hep B S Ab: REACTIVE — AB

## 2022-09-12 MED ORDER — MIDODRINE HCL 5 MG PO TABS
15.0000 mg | ORAL_TABLET | Freq: Three times a day (TID) | ORAL | Status: DC
  Administered 2022-09-12 – 2022-09-16 (×5): 15 mg via ORAL
  Filled 2022-09-12 (×6): qty 3

## 2022-09-12 MED ORDER — DM-GUAIFENESIN ER 30-600 MG PO TB12
1.0000 | ORAL_TABLET | Freq: Two times a day (BID) | ORAL | Status: DC
  Administered 2022-09-12 – 2022-09-17 (×11): 1 via ORAL
  Filled 2022-09-12 (×12): qty 1

## 2022-09-12 MED ORDER — INSULIN ASPART 100 UNIT/ML IJ SOLN
0.0000 [IU] | Freq: Three times a day (TID) | INTRAMUSCULAR | Status: DC
  Administered 2022-09-12: 1 [IU] via SUBCUTANEOUS
  Administered 2022-09-12: 2 [IU] via SUBCUTANEOUS
  Administered 2022-09-13 – 2022-09-14 (×3): 1 [IU] via SUBCUTANEOUS
  Administered 2022-09-14: 3 [IU] via SUBCUTANEOUS
  Administered 2022-09-14 – 2022-09-15 (×3): 1 [IU] via SUBCUTANEOUS
  Administered 2022-09-15: 2 [IU] via SUBCUTANEOUS
  Administered 2022-09-16 (×2): 1 [IU] via SUBCUTANEOUS

## 2022-09-12 MED ORDER — BENZONATATE 100 MG PO CAPS
200.0000 mg | ORAL_CAPSULE | Freq: Two times a day (BID) | ORAL | Status: DC | PRN
  Administered 2022-09-12 – 2022-09-16 (×2): 200 mg via ORAL
  Filled 2022-09-12 (×2): qty 2

## 2022-09-12 NOTE — Progress Notes (Signed)
[facility] Progress Note   Assessment/ Plan:   OP HD orders:  Dialyzes at [facility], MWF, 4 hours, 2k 2ca, edw 44.1kg Access; RUE AVG Mircera 30 mcg on 8/11, last Hb 11.3 Hectorol 5 mcg w/each HD Sensipar 30 mg with HD.  Sevelamer 800 mg 2 tabs with each meal.   #Sepsis in the setting of COVID-19 positive: Chest x-ray with no acute finding.  Received vancomycin, cefepime and Flagyl.  Pending blood culture result. CTA no PE, some tree-in-bud opacities and medistinal LAD.  Getting actemra as well.  On small dose of levophed   # ESRD: MWF.  Last treatment yesterday, she did not complete full treatment.  She has some chronic hyperkalemia, volume acceptable.  s/p IHD 9/29-9/30, next HD Monday     #Chronic hyperkalemia in ESRD patient: Resolving with HD and lokelma   # Hypotension: on midodrine as OP   # Anemia of ESRD: Hemoglobin above goal.  Last dose of ESA back in August.   # Metabolic Bone Disease/hyperphosphatemia: Resume sevelamer.  Monitor lab  Subjective:    Seen in room.  On 3 mcg /min levophed.  On HD, tolerating well.     Objective:   BP (!) 158/61 (BP Location: Left Leg)   Pulse 62   Temp 98.2 F (36.8 C) (Axillary)   Resp 11   Ht '5\' 3"'$  (1.6 m)   Wt 50.2 kg   SpO2 100%   BMI 19.60 kg/m   Physical Exam: Gen:NAD, sitting in bed CVS: RRR Resp: clear anteriorly Abd: soft Ext: trace LE edema ACCESS: R AVF + T/B, L old AVG not usable  Labs: BMET Recent Labs  Lab 09/07/22 0407 09/07/22 0424 09/10/22 1353 09/10/22 1918 09/11/22 0401 09/12/22 0410  NA 134* 129* 139 135 134* 134*  K 6.1* 5.8* 5.9* 5.2* 5.8* 5.8*  CL 92*  --  94*  --  93* 93*  CO2 22  --  24  --  26 20*  GLUCOSE 80  --  95  --  175* 160*  BUN 78*  --  47*  --  59* 81*  CREATININE 5.78*  --  5.61*  --  6.02* 7.02*  CALCIUM 8.5*  --  8.2*  --  8.1* 8.4*  PHOS  --   --   --   --   --  8.3*   CBC Recent Labs  Lab 09/07/22 0407 09/07/22 0424 09/10/22 1353 09/10/22 1918  09/11/22 0401 09/12/22 0410  WBC 5.8  --  8.0  --  6.6 8.9  NEUTROABS 3.0  --  5.7  --  5.4 7.4  HGB 11.1*   < > 12.4 10.2* 11.1* 11.1*  HCT 34.3*   < > 40.0 30.0* 33.4* 32.2*  MCV 97.2  --  102.6*  --  96.5 93.3  PLT 195  --  183  --  231 231   < > = values in this interval not displayed.      Medications:     atorvastatin  80 mg Oral Daily   Chlorhexidine Gluconate Cloth  6 each Topical Daily   clopidogrel  75 mg Oral Daily   dexamethasone  6 mg Oral Daily   dextromethorphan-guaiFENesin  1 tablet Oral BID   heparin injection (subcutaneous)  5,000 Units Subcutaneous Q8H   insulin aspart  0-9 Units Subcutaneous Q4H   midodrine  15 mg Oral TID WC   pantoprazole  40 mg Oral Daily   sevelamer carbonate  800 mg  Oral TID WC     Madelon Lips MD 09/12/2022, 9:32 AM

## 2022-09-12 NOTE — Consult Note (Signed)
Palliative Care Consult Note                                  Date: 09/12/2022   Patient Name: [name]  DOB: 12/15/1950  MRN: [id number]  Age / Sex: 71 y.o., female  PCP: [name], MD Referring Physician: Collene Gobble, MD  Reason for Consultation: Establishing goals of care  HPI/Patient Profile: 71 y.o. female  with past medical history of ESRD on HD MWF, chronic diastolic CHF (LVEF 60 to 47%, G2 DD), HLD, and TIA who presented to [facility] ED on 09/10/2022 with shortness of breath, diarrhea, and nausea for the past few days.  Positive for COVID-19 on admission.  Found to be hypoxic at 86% and requiring oxygen at 6 L.  Sepsis protocol initiated in the ED.  CTA negative for PE, but with RUL and RLL micronodules and opacities that are likely infectious/inflammatory.  She remained hypotensive despite fluid resuscitation and required vasopressors. Admitted to the ICU with COVID-19 infection with acute hypoxic respiratory failure and likely septic shock. Palliative Medicine was consulted for goals of care.   Past Medical History:  Diagnosis Date  . Acute ischemic stroke ([place]) 02/23/2017  . Acute on chronic diastolic CHF (congestive heart failure) ([place])   . Adenomatous polyp of colon 10/2010, 2006, 2015  . Anemia in CKD (chronic kidney disease) 11/07/2012   s/p blood transfusion.   . Arthritis   . CAD (coronary artery disease)    NSTEMI 05/2020  . Critical limb ischemia of left lower extremity with ulceration of foot ([place]) 03/17/2022  . Depression with anxiety   . Diverticulitis of colon with perforation 08/06/2021  . 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  Had some early rejection then stable function for years, then had slow decline of function and went back on hemodialysis in 2012.  Gets HD TTS schedule at [facility] on [facility] still using  L forearm AVF.     Marland Kitchen GERD (gastroesophageal reflux disease)   . GI bleed 2017   felt to be ischemic colitis, last colo 2015  . Hyperlipidemia   . Hypertension   . Neuromuscular disorder (HCC)    neuropathy hand and legs  . Osteoporosis   . Pseudoaneurysm of surgical AV fistula (HCC)    left upper arm    Subjective:   I have reviewed medical records including progress notes, labs and imaging, discussed with ICU team, and assessed the patient at bedside.  She is confused and tells me she is at her mother's house.  She is not able to tell me why she is in the hospital or any of her medical conditions.  I spoke with her son Quillian Quince and her [name] (separately, by phone) to discuss diagnosis, prognosis, GOC, EOL wishes, disposition, and options.  I introduced Palliative Medicine as specialized medical care for people living with serious illness. It focuses on providing relief from the symptoms and stress of a serious illness.   A brief social history was reviewed. Patient lives with her 60 year old mother, who is described as having "moderate" dementia. Her [name] comes every day to help with their mom. Patient has 2 sons, Quillian Quince and Marjory Lies. Quillian Quince is very involved in his mother's care.  Regarding functional status, patient is ambulatory with a walker and is able to  perform some of her own basic ADLs. They have a caregiver that comes to help for a few hours on non-dialysis days. Patient utilizes [facility] for transportation to dialysis.   We discussed patient's current illness and what it means in the larger context of her ongoing co-morbidities. Reviewed that patient has multiple chronic issues including ESRD, heart failure, and peripheral vascular disease. Reviewed that these chronic issues are non-curable and progressive.  Discussed that she was admitted with COVID-19 with acute respiratory failure. Current clinical status was reviewed; that shock has resolved and she is stable to  transfer out of ICU.   Values and goals of care important to patient and family were discussed. 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 The goal is for improvement and to ultimately have her return home, although they would agree with short-term rehab if needed to improve functional status.   The difference between full scope medical intervention and comfort care was briefly reviewed. According to family, patient has previously been accepting of all full scope medical interventions offered. She has never expressed any desire not to continue dialysis.   We discussed code status and that patient has expressed during this admission that she would want to be resuscitated. However, chart review showed that when patient was hospitalized in May 2023, she expressed to the admitting provider that she would not want to be resuscitated.  Discussed that patient's prognosis would be poor in the event of cardiopulmonary arrest as the cause of the arrest would likely be associated with chronic disease rather than a reversible acute cardio-pulmonary event. Both son and sister would agree that DNR status is appropriate, but will need to further discuss with patient when her mental status is more consistently clear.   Discussed the importance of continued conversation with family and the medical team regarding overall plan of care and treatment options. Offered ongoing palliative support.    Review of Systems  Unable to perform ROS: Mental status change    Objective:   Primary Diagnoses: Present on Admission: . Acute respiratory failure with hypoxia ([place]) . (Resolved) HTN (hypertension) . Hypotension . Anemia of chronic renal failure . Hyperlipidemia . (Resolved) Hyperlipidemia, unspecified . Prolonged QT interval . Septic shock vs. hypovolemic shock in setting of covid 19    Physical Exam Vitals reviewed.  Constitutional:      General: She is not in acute distress.     Appearance: She is ill-appearing.  Pulmonary:     Effort: Pulmonary effort is normal.  Neurological:     Mental Status: She is lethargic and confused.     Vital Signs:  BP (!) 117/46   Pulse 73   Temp 98.4 F (36.9 C) (Oral)   Resp 18   Ht '5\' 3"'$  (1.6 m)   Wt 50.2 kg   SpO2 90%   BMI 19.60 kg/m   Palliative Assessment/Data: PPS 30%     Assessment & Plan:   SUMMARY OF RECOMMENDATIONS   Full code for now with ongoing discussion Continue current interventions Family's goal is for improvement and for patient to ultimately return home PMT will continue to follow  Primary Decision Maker: Patient's son Quillian Quince  Prognosis:  Very guarded  Discharge Planning:  To Be Determined    Thank you for allowing [place] to participate in the care of [name]  MDM - High   Signed by: Elie Confer, NP Palliative Medicine Team  Team Phone # [phone]  For individual providers,  please see AMION

## 2022-09-12 NOTE — Procedures (Signed)
Patient seen and examined on Hemodialysis. BP (!) 158/61 (BP Location: Left Leg)   Pulse 62   Temp 98.2 F (36.8 C) (Axillary)   Resp 11   Ht '5\' 3"'$  (1.6 m)   Wt 50.2 kg   SpO2 100%   BMI 19.60 kg/m   QB 400 mL/ min via R AVF, UF goal 2L  Tolerating treatment without complaints at this time.  Madelon Lips MD [facility] Pgr [phone] 9:33 AM

## 2022-09-12 NOTE — Progress Notes (Signed)
NAME:  [name], MRN:  [id number], DOB:  [birth year], LOS: 2 ADMISSION DATE:  09/10/2022, CONSULTATION DATE:  9/28 REFERRING MD:  Dr. Rogers Blocker, CHIEF COMPLAINT:  Hypotension/Shock   History of Present Illness:  [name] is a 71 y/o woman with a history of ESRD on HD MWF, chronic diastolic CHF (LVEF 99-24%, G2DD), HLD, TIA who presented to the hospital from SNF with SOB, diarrhea, nausea for the past few days, and positive for COVID-19 on admission. Presented to the ED with hypoxia at 86%, placed on 2L [place], requiring maximum of 6L [place]. Sepsis protocol initiated in ED. She received a 500cc IVF bolus and was started on empiric antibiotics (Cefepime, Vanc, Flagyl) for presumed bacteremia after 1 blood culture was obtained. Unfortunately after her fluid bolus her BP dropped and CCM was consulted for hypotension and hypoxia. Last HD was 9/27,  but lasted only for 2 hours.  Initial labs showed lactic acidosis to 4.7, hyperkalemia to 5.9, mild transaminitis AST/ALT 108/49. Nephrology consulted.   Of note, follows outpatient with ID for hx MSSA bacteremia and C diff in 04/2022.  Pertinent  Medical History  ESRD on HD HFrEF CAD GERD HTN> chronic hypotension on midodrine now HLD PAD, critical limb ischemia  Significant Hospital Events: Including procedures, antibiotic start and stop dates in addition to other pertinent events   9/28 Admitted. PCCM consulted. Nephrology consulted. Started on IV Vancomycin, Cefepime, Flagl. CTA PE negative, but with RUL and RLL micro-nodules and opacities that are likely infectious/inflammatory. CXR without sign of pneumonia or pulmonary edema.  Interim History / Subjective:  On 4L [place]. Undergoing iHD this AM.   Objective   Blood pressure (!) (P) 158/61, pulse 63, temperature (P) 98.2 F (36.8 C), temperature source (P) Axillary, resp. rate 12, height '5\' 3"'$  (1.6 m), weight 50.2 kg, SpO2 99 %.        Intake/Output Summary (Last 24 hours) at 09/12/2022  0849 Last data filed at 09/12/2022 0600 Gross per 24 hour  Intake 899.73 ml  Output --  Net 899.73 ml   Filed Weights   09/10/22 1225 09/12/22 0455  Weight: 46.1 kg 50.2 kg    Examination: General: Chronically ill appearing elderly female, lying in bed, no distress  HENT: pupils intact and reactive, dry MM  Lungs: Coarse breath sounds, no use of accessory muscles  Cardiovascular: RRR, no mRG Abdomen: Soft, active bowel sounds, non-tender, non-distended  Extremities: non-functioning Left arm fistula, new right arm fistula in place, left arm art line.  Neuro: alert, oriented, follows commands   Resolved Hospital Problem list     Assessment & Plan:   COVID-19 infection with acute hypoxic respiratory failure Shock, likely septic in setting of respiratory illness Given co-morbidities and acuity of illness including hypotension requiring vasopressors, started on monoclonal antibodies and obtain inflammatory markers. - Fibrinogen 350. CRP 8.2  - Received Toci 9/29  Plan - Continue Decadron 6 mg daily x 10 days - Zofran PRN q8h PRN - Albuterol 1-2 puffs q6h PRN - Add Mucinex and PRN tessalon pearls, Continue Robitussin PRN   Shock, likely septic in setting of COVID-19 CXR without overt signs of pneumonia. No urine culture obtained, patient without urine output. No other source seen on exam. MAPs > 65. Hx of chronic hypotension. Awake, alert, well-perfused on exam this morning. Procal elevated to 8.51. Negative MRSA PCR. Lactic acid downtrending 4.7 -> 2.9. Plan - Continue Cefepime - Continue midodrine '15mg'$  TID - Norepi to maintain MAP >65; wean as tolerated (  currently on 2 mcg/min)  - Follow blood cultures (remain negative this AM)   Chronic diastolic CHF CAD without angina B-blocker d/c'ed in the past. No sign of volume overload on exam although BNP elevated. Plan - Atorvostatin 40 mg daily  ESRD; hx transplant Plan - Nephrology following - Phos  binders  Transaminitis Increasing, likely in setting of shock. Plan - Monitor daily  GOC: Desires full code, prior DNR in 5/2-23. Surrogate decision maker is son. Needs continued palliative discussion.  Best Practice (right click and "Reselect all SmartList Selections" daily)   Diet/type: Regular consistency (see orders) DVT prophylaxis: prophylactic heparin  GI prophylaxis: PPI Lines: Arterial Line Foley:  N/A Code Status:  full code Last date of multidisciplinary goals of care discussion [need to discuss w/ son].   Labs   CBC: Recent Labs  Lab 09/07/22 0407 09/07/22 0424 09/10/22 1353 09/10/22 1918 09/11/22 0401 09/12/22 0410  WBC 5.8  --  8.0  --  6.6 8.9  NEUTROABS 3.0  --  5.7  --  5.4 7.4  HGB 11.1* 11.6* 12.4 10.2* 11.1* 11.1*  HCT 34.3* 34.0* 40.0 30.0* 33.4* 32.2*  MCV 97.2  --  102.6*  --  96.5 93.3  PLT 195  --  183  --  231 295    Basic Metabolic Panel: Recent Labs  Lab 09/07/22 0407 09/07/22 0424 09/10/22 1353 09/10/22 1900 09/10/22 1918 09/11/22 0401 09/12/22 0410  NA 134* 129* 139  --  135 134* 134*  K 6.1* 5.8* 5.9*  --  5.2* 5.8* 5.8*  CL 92*  --  94*  --   --  93* 93*  CO2 22  --  24  --   --  26 20*  GLUCOSE 80  --  95  --   --  175* 160*  BUN 78*  --  47*  --   --  59* 81*  CREATININE 5.78*  --  5.61*  --   --  6.02* 7.02*  CALCIUM 8.5*  --  8.2*  --   --  8.1* 8.4*  MG  --   --   --  2.0  --   --   --   PHOS  --   --   --   --   --   --  8.3*   GFR: Estimated Creatinine Clearance: 5.7 mL/min (A) (by C-G formula based on SCr of 7.02 mg/dL (H)). Recent Labs  Lab 09/07/22 0407 09/10/22 1353 09/10/22 1357 09/10/22 1900 09/10/22 2205 09/11/22 0401 09/12/22 0410  PROCALCITON  --   --   --  8.36 8.51  --   --   WBC 5.8 8.0  --   --   --  6.6 8.9  LATICACIDVEN  --   --  4.7* 2.9*  --   --   --     Liver Function Tests: Recent Labs  Lab 09/07/22 0407 09/10/22 1353 09/11/22 0401 09/12/22 0410  AST 44* 108* 236* 453*  ALT  28 49* 109* 219*  ALKPHOS 206* 252* 221* 195*  BILITOT 0.7 1.6* 1.2 1.0  PROT 6.6 7.5 6.5 6.3*  ALBUMIN 3.2* 3.3* 3.2* 2.9*   Recent Labs  Lab 09/07/22 0407  LIPASE 49   No results for input(s): "AMMONIA" in the last 168 hours.  ABG    Component Value Date/Time   PHART 7.361 04/27/2022 1941   PCO2ART 42.2 04/27/2022 1941   PO2ART 424 (H) 04/27/2022 1941   HCO3 28.7 (H) 09/10/2022 2159  TCO2 31 09/10/2022 1918   ACIDBASEDEF 2.0 04/27/2022 1941   O2SAT 43.7 09/10/2022 2159     Coagulation Profile: Recent Labs  Lab 09/10/22 1353  INR 1.8*    Cardiac Enzymes: No results for input(s): "CKTOTAL", "CKMB", "CKMBINDEX", "TROPONINI" in the last 168 hours.  HbA1C: Hgb A1c MFr Bld  Date/Time Value Ref Range Status  09/10/2022 07:00 PM 5.5 4.8 - 5.6 % Final    Comment:    (NOTE) Pre diabetes:          5.7%-6.4%  Diabetes:              >6.4%  Glycemic control for   <7.0% adults with diabetes   02/22/2021 02:49 AM 5.0 4.8 - 5.6 % Final    Comment:    (NOTE) Pre diabetes:          5.7%-6.4%  Diabetes:              >6.4%  Glycemic control for   <7.0% adults with diabetes     CBG: Recent Labs  Lab 09/11/22 1550 09/11/22 2002 09/12/22 0043 09/12/22 0423 09/12/22 0750  GLUCAP 119* 148* 142* 153* 121*    Review of Systems:   +Cough with sputum production. Denies chest pain    CRITICAL CARE Performed by: Omar Person   Total critical care time: 35 minutes  Critical care time was exclusive of separately billable procedures and treating other patients.  Critical care was necessary to treat or prevent imminent or life-threatening deterioration.  Critical care was time spent personally by me on the following activities: development of treatment plan with patient and/or surrogate as well as nursing, discussions with consultants, evaluation of patient's response to treatment, examination of patient, obtaining history from patient or surrogate, ordering  and performing treatments and interventions, ordering and review of laboratory studies, ordering and review of radiographic studies, pulse oximetry and re-evaluation of patient's condition.     Hayden Pedro, AGACNP-BC [facility]  PCCM Pgr: [phone]

## 2022-09-12 NOTE — Progress Notes (Signed)
HD tx complete

## 2022-09-12 NOTE — Progress Notes (Signed)
Post HD RN assessment 

## 2022-09-12 NOTE — Care Plan (Signed)
Patient transferred to [place] 3 via bed. Report called and given to the nurse. The patient is on 4 Liters nasal canula. Callight within reach

## 2022-09-12 NOTE — Progress Notes (Signed)
Pt complete 3.5 hour HD tx w/ no complications. Alert, stable, sites held post HD for >30 minutes, report to ICU RN.  Start: 0515 End: 7588 2010m fluid removed UTA post weight d/t equipment on bed and wedge on left arm 84L BvP No HD meds ordered

## 2022-09-13 DIAGNOSIS — A419 Sepsis, unspecified organism: Secondary | ICD-10-CM | POA: Diagnosis not present

## 2022-09-13 DIAGNOSIS — R6521 Severe sepsis with septic shock: Secondary | ICD-10-CM | POA: Diagnosis not present

## 2022-09-13 LAB — CBC WITH DIFFERENTIAL/PLATELET
Abs Immature Granulocytes: 0.06 10*3/uL (ref 0.00–0.07)
Basophils Absolute: 0 10*3/uL (ref 0.0–0.1)
Basophils Relative: 0 %
Eosinophils Absolute: 0 10*3/uL (ref 0.0–0.5)
Eosinophils Relative: 0 %
HCT: 33.1 % — ABNORMAL LOW (ref 36.0–46.0)
Hemoglobin: 11.1 g/dL — ABNORMAL LOW (ref 12.0–15.0)
Immature Granulocytes: 1 %
Lymphocytes Relative: 9 %
Lymphs Abs: 0.8 10*3/uL (ref 0.7–4.0)
MCH: 31.9 pg (ref 26.0–34.0)
MCHC: 33.5 g/dL (ref 30.0–36.0)
MCV: 95.1 fL (ref 80.0–100.0)
Monocytes Absolute: 0.4 10*3/uL (ref 0.1–1.0)
Monocytes Relative: 5 %
Neutro Abs: 7.2 10*3/uL (ref 1.7–7.7)
Neutrophils Relative %: 85 %
Platelets: 226 10*3/uL (ref 150–400)
RBC: 3.48 MIL/uL — ABNORMAL LOW (ref 3.87–5.11)
RDW: 18.9 % — ABNORMAL HIGH (ref 11.5–15.5)
WBC: 8.5 10*3/uL (ref 4.0–10.5)
nRBC: 0.5 % — ABNORMAL HIGH (ref 0.0–0.2)

## 2022-09-13 LAB — COMPREHENSIVE METABOLIC PANEL
ALT: 190 U/L — ABNORMAL HIGH (ref 0–44)
AST: 278 U/L — ABNORMAL HIGH (ref 15–41)
Albumin: 3 g/dL — ABNORMAL LOW (ref 3.5–5.0)
Alkaline Phosphatase: 177 U/L — ABNORMAL HIGH (ref 38–126)
Anion gap: 19 — ABNORMAL HIGH (ref 5–15)
BUN: 51 mg/dL — ABNORMAL HIGH (ref 8–23)
CO2: 24 mmol/L (ref 22–32)
Calcium: 8.3 mg/dL — ABNORMAL LOW (ref 8.9–10.3)
Chloride: 93 mmol/L — ABNORMAL LOW (ref 98–111)
Creatinine, Ser: 4.77 mg/dL — ABNORMAL HIGH (ref 0.44–1.00)
GFR, Estimated: 9 mL/min — ABNORMAL LOW (ref >60)
Glucose, Bld: 166 mg/dL — ABNORMAL HIGH (ref 70–99)
Potassium: 4.5 mmol/L (ref 3.5–5.1)
Sodium: 136 mmol/L (ref 135–145)
Total Bilirubin: 0.8 mg/dL (ref 0.3–1.2)
Total Protein: 6.5 g/dL (ref 6.5–8.1)

## 2022-09-13 LAB — GLUCOSE, CAPILLARY
Glucose-Capillary: 123 mg/dL — ABNORMAL HIGH (ref 70–99)
Glucose-Capillary: 174 mg/dL — ABNORMAL HIGH (ref 70–99)
Glucose-Capillary: 134 mg/dL — ABNORMAL HIGH (ref 70–99)
Glucose-Capillary: 148 mg/dL — ABNORMAL HIGH (ref 70–99)

## 2022-09-13 LAB — MAGNESIUM: Magnesium: 1.9 mg/dL (ref 1.7–2.4)

## 2022-09-13 MED ORDER — PROSOURCE PLUS PO LIQD
30.0000 mL | Freq: Two times a day (BID) | ORAL | Status: DC
  Administered 2022-09-13 – 2022-09-16 (×6): 30 mL via ORAL
  Filled 2022-09-13 (×8): qty 30

## 2022-09-13 NOTE — Progress Notes (Signed)
[facility] Progress Note   Subjective:  Seen in 9M - out of ICU, off pressors. Nasal O2 in place, says she is feeling much better. Still coughing.  Objective Vitals:   09/12/22 1630 09/12/22 2242 09/13/22 0616 09/13/22 0850  BP: (!) 121/50 (!) 137/57 132/87 (!) 140/51  Pulse: 60 63 (!) 57 (!) 59  Resp: '20 18 13 16  '$ Temp:  98.1 F (36.7 C) 98.4 F (36.9 C) 98 F (36.7 C)  TempSrc:  Oral  Oral  SpO2: 100% 98% 97% 97%  Weight:      Height:       Physical Exam General: Frail woman, NAD. Nasal O2 in place Heart: RRR; 2/6 murmur Lungs: Coarse air movement, no rales Abdomen: soft Extremities: trace BLE edema Dialysis Access:  RUE AVF + thrill, old L AVG not useable  Additional Objective Labs: Basic Metabolic Panel: Recent Labs  Lab 09/11/22 0401 09/12/22 0410 09/12/22 0953  NA 134* 134* 133*  K 5.8* 5.8* 3.9  CL 93* 93* 94*  CO2 26 20* 25  GLUCOSE 175* 160* 135*  BUN 59* 81* 28*  CREATININE 6.02* 7.02* 3.18*  CALCIUM 8.1* 8.4* 7.7*  PHOS  --  8.3* 4.5   Liver Function Tests: Recent Labs  Lab 09/10/22 1353 09/11/22 0401 09/12/22 0410 09/12/22 0953  AST 108* 236* 453*  --   ALT 49* 109* 219*  --   ALKPHOS 252* 221* 195*  --   BILITOT 1.6* 1.2 1.0  --   PROT 7.5 6.5 6.3*  --   ALBUMIN 3.3* 3.2* 2.9* 2.9*   Recent Labs  Lab 09/07/22 0407  LIPASE 49   CBC: Recent Labs  Lab 09/07/22 0407 09/07/22 0424 09/10/22 1353 09/10/22 1918 09/11/22 0401 09/12/22 0410  WBC 5.8  --  8.0  --  6.6 8.9  NEUTROABS 3.0  --  5.7  --  5.4 7.4  HGB 11.1*   < > 12.4 10.2* 11.1* 11.1*  HCT 34.3*   < > 40.0 30.0* 33.4* 32.2*  MCV 97.2  --  102.6*  --  96.5 93.3  PLT 195  --  183  --  231 231   < > = values in this interval not displayed.   Blood Culture    Component Value Date/Time   SDES BLOOD RIGHT ARM 09/10/2022 2205   SPECREQUEST  09/10/2022 2205    BOTTLES DRAWN AEROBIC AND ANAEROBIC Blood Culture results may not be optimal due to an excessive  volume of blood received in culture bottles   CULT  09/10/2022 2205    NO GROWTH 2 DAYS Performed at [facility], [place] [address], [place], [place] [address]    REPTSTATUS PENDING 09/10/2022 2205   Medications:  ceFEPime (MAXIPIME) IV 1 g (09/12/22 1509)    atorvastatin  80 mg Oral Daily   Chlorhexidine Gluconate Cloth  6 each Topical Daily   clopidogrel  75 mg Oral Daily   dexamethasone  6 mg Oral Daily   dextromethorphan-guaiFENesin  1 tablet Oral BID   heparin injection (subcutaneous)  5,000 Units Subcutaneous Q8H   insulin aspart  0-9 Units Subcutaneous TID AC & HS   midodrine  15 mg Oral Q8H   pantoprazole  40 mg Oral Daily   sevelamer carbonate  800 mg Oral TID WC    Dialysis Orders: [facility] at [facility], MWF, 4 hours, 2k 2ca, edw 44.1kg Access; RUE AVG Mircera 30 mcg on 8/11, last Hb 11.3 Hectorol 5 mcg w/each  HD Sensipar 30 mg with HD.  Sevelamer 800 mg 2 tabs with each meal.   Assessment/Plan: Sepsis in the setting of COVID-19 positive: Initially on Vanc/Cefepime/Flagyl -> now cefepime alone. Blood Cx 9/28 negative. CTA no PE, some tree-in-bud opacities and medistinal LAD. S/p Actemra + steroids. Off pressors. ESRD: Back to usual MWF schedule after being off schedule this week and issues with hyperK. Next HD 10/2.  Chronic hyperkalemia in ESRD patient: Resolving with HD and lokelma Hypotension: on midodrine '15mg'$  TID for now, follow. Anemia of ESRD: Hemoglobin > 11. Last dose of ESA back in August. Metabolic Bone Disease/hyperphosphatemia: CorrCa/Phos ok, continue sevelamer.   Nutrition: Alb low, adding protein supps.   Veneta Penton, PA-C 09/13/2022, 9:38 AM  [employer]

## 2022-09-13 NOTE — Progress Notes (Signed)
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 difficile colitis in addition Patient was discharged to skilled facility and eventually returned home  Worsening shortness of breath abdominal pain nausea vomiting emergency room found to be COVID-positive Tmax 100.8 started on vancomycin and cefepime Also started on Tocilizumab Decadron Was on pressors transiently 9/29 until 9/30 and this was discontinued  Transferred to [facility] on 10/1   Hospital-Problem based course  septic shock on admission blood cultures negative urine culture not collected because dialysis  Patient remains on cefepime and midodrine --get PCt and de-escalat off IV abx CT chest showing no PE possible underlying infection midodrine dose can be weaned from 15- probably in the next day or so shock physiology resolved judicious fluids  covid-19 continue decadron for total of 10 days ending 10/7--precaution can end up on the same date prone at least 16 hours a day she only received 1 dose of Tocilizumab Symptomatic treatment with Mucinex guaifenesin Tessalon and albuterol  esrd mwf, history of transplant, history of av graft failure and infection hyperkalemia hyperkalemia being managed at dialysis phosphorus is okay, continue renvela 800 3 times daily  Recurrent CVAs Continue Plavix 75 daily, atorvastatin 80 daily  Elevated LFTs probably from COVID Repeat labs a.m. bilirubin is normal-expect will trend downwards  NSTEMI on admission troponins in  the 400 range EKG showed no ST-T wave changes As not having chest pain would attribute to this being an acute phase reactant secondary to the infection-if asked chest pain would work-up  DVT prophylaxis: Heparin Code Status: Full Family Communication: None Disposition:  Status is: Inpatient Remains inpatient appropriate because:   Still symptomatic requires down titration of meds etc.   Consultants:  Renal  Procedures: None  Antimicrobials: Cefepime   Subjective: Awake coherent--states food is cold Some cough no fever no chills no rigors  Objective: Vitals:   09/12/22 1615 09/12/22 1630 09/12/22 2242 09/13/22 0616  BP:  (!) 121/50 (!) 137/57 132/87  Pulse: 68 60 63 (!) 57  Resp: '12 20 18 13  '$ Temp:   98.1 F (36.7 C) 98.4 F (36.9 C)  TempSrc:   Oral   SpO2: 100% 100% 98% 97%  Weight:      Height:        Intake/Output Summary (Last 24 hours) at 09/13/2022 0831 Last data filed at 09/13/2022 0616 Gross per 24 hour  Intake 120 ml  Output 4000 ml  Net -3880 ml   Filed Weights   09/10/22 1225 09/12/22 0455  Weight: 46.1 kg 50.2 kg    Examination:  Frail cachectic BF in no distress No ict no pallor Cta b no added sound no rales no rhonchi Abd soft nt nd no rebound no gaurding--flat Neuro intact no focal deficit--moving 4 limbs equally   Data Reviewed: personally reviewed   CBC    Component Value Date/Time   WBC 8.9 09/12/2022 0410   RBC 3.45 (L) 09/12/2022 0410   HGB 11.1 (L) 09/12/2022 0410   HGB 10.6 (L) 07/13/2018 0814   HGB 8.7 (L) 09/24/2011 1038   HCT 32.2 (L) 09/12/2022 0410  HCT 33.0 (L) 07/13/2018 0814   HCT 27.3 (L) 09/24/2011 1038   PLT 231 09/12/2022 0410   PLT 203 07/13/2018 0814   MCV 93.3 09/12/2022 0410   MCV 91 07/13/2018 0814   MCV 94.5 09/24/2011 1038   MCH 32.2 09/12/2022 0410   MCHC 34.5 09/12/2022 0410   RDW 18.9 (H) 09/12/2022 0410   RDW 16.9 (H) 07/13/2018 0814   RDW 20.1 (H) 09/24/2011 1038   LYMPHSABS 0.9 09/12/2022  0410   LYMPHSABS 1.1 09/24/2011 1038   MONOABS 0.4 09/12/2022 0410   MONOABS 0.6 09/24/2011 1038   EOSABS 0.0 09/12/2022 0410   EOSABS 0.1 09/24/2011 1038   BASOSABS 0.0 09/12/2022 0410   BASOSABS 0.0 09/24/2011 1038      Latest Ref Rng & Units 09/12/2022    9:53 AM 09/12/2022    4:10 AM 09/11/2022    4:01 AM  CMP  Glucose 70 - 99 mg/dL 135  160  175   BUN 8 - 23 mg/dL 28  81  59   Creatinine 0.44 - 1.00 mg/dL 3.18  7.02  6.02   Sodium 135 - 145 mmol/L 133  134  134   Potassium 3.5 - 5.1 mmol/L 3.9  5.8  5.8   Chloride 98 - 111 mmol/L 94  93  93   CO2 22 - 32 mmol/L '25  20  26   '$ Calcium 8.9 - 10.3 mg/dL 7.7  8.4  8.1   Total Protein 6.5 - 8.1 g/dL  6.3  6.5   Total Bilirubin 0.3 - 1.2 mg/dL  1.0  1.2   Alkaline Phos 38 - 126 U/L  195  221   AST 15 - 41 U/L  453  236   ALT 0 - 44 U/L  219  109      Radiology Studies: No results found.   Scheduled Meds:  atorvastatin  80 mg Oral Daily   Chlorhexidine Gluconate Cloth  6 each Topical Daily   clopidogrel  75 mg Oral Daily   dexamethasone  6 mg Oral Daily   dextromethorphan-guaiFENesin  1 tablet Oral BID   heparin injection (subcutaneous)  5,000 Units Subcutaneous Q8H   insulin aspart  0-9 Units Subcutaneous TID AC & HS   midodrine  15 mg Oral Q8H   pantoprazole  40 mg Oral Daily   sevelamer carbonate  800 mg Oral TID WC   Continuous Infusions:  ceFEPime (MAXIPIME) IV 1 g (09/12/22 1509)     LOS: 3 days   Time spent: 66  Nita Sells, MD [facility] To contact the attending provider between 7A-7P or the covering provider during after hours 7P-7A, please log into the web site [email or web address] and access using universal [place] password for that web site. If you do not have the password, please call the hospital operator.  09/13/2022, 8:31 AM

## 2022-09-14 ENCOUNTER — Telehealth (HOSPITAL_COMMUNITY): Payer: Self-pay | Admitting: *Deleted

## 2022-09-14 DIAGNOSIS — N186 End stage renal disease: Secondary | ICD-10-CM | POA: Diagnosis not present

## 2022-09-14 DIAGNOSIS — J9601 Acute respiratory failure with hypoxia: Secondary | ICD-10-CM | POA: Diagnosis not present

## 2022-09-14 DIAGNOSIS — A419 Sepsis, unspecified organism: Secondary | ICD-10-CM | POA: Diagnosis not present

## 2022-09-14 DIAGNOSIS — R6521 Severe sepsis with septic shock: Secondary | ICD-10-CM | POA: Diagnosis not present

## 2022-09-14 DIAGNOSIS — Z8673 Personal history of transient ischemic attack (TIA), and cerebral infarction without residual deficits: Secondary | ICD-10-CM | POA: Diagnosis not present

## 2022-09-14 DIAGNOSIS — Z515 Encounter for palliative care: Secondary | ICD-10-CM

## 2022-09-14 LAB — CBC WITH DIFFERENTIAL/PLATELET
Abs Immature Granulocytes: 0.04 10*3/uL (ref 0.00–0.07)
Basophils Absolute: 0 10*3/uL (ref 0.0–0.1)
Basophils Relative: 0 %
Eosinophils Absolute: 0 10*3/uL (ref 0.0–0.5)
Eosinophils Relative: 0 %
HCT: 32.1 % — ABNORMAL LOW (ref 36.0–46.0)
Hemoglobin: 10.6 g/dL — ABNORMAL LOW (ref 12.0–15.0)
Immature Granulocytes: 1 %
Lymphocytes Relative: 9 %
Lymphs Abs: 0.6 10*3/uL — ABNORMAL LOW (ref 0.7–4.0)
MCH: 32 pg (ref 26.0–34.0)
MCHC: 33 g/dL (ref 30.0–36.0)
MCV: 97 fL (ref 80.0–100.0)
Monocytes Absolute: 0.4 10*3/uL (ref 0.1–1.0)
Monocytes Relative: 5 %
Neutro Abs: 5.6 10*3/uL (ref 1.7–7.7)
Neutrophils Relative %: 85 %
Platelets: 200 10*3/uL (ref 150–400)
RBC: 3.31 MIL/uL — ABNORMAL LOW (ref 3.87–5.11)
RDW: 19 % — ABNORMAL HIGH (ref 11.5–15.5)
WBC: 6.6 10*3/uL (ref 4.0–10.5)
nRBC: 1.2 % — ABNORMAL HIGH (ref 0.0–0.2)

## 2022-09-14 LAB — COMPREHENSIVE METABOLIC PANEL
ALT: 138 U/L — ABNORMAL HIGH (ref 0–44)
AST: 182 U/L — ABNORMAL HIGH (ref 15–41)
Albumin: 2.9 g/dL — ABNORMAL LOW (ref 3.5–5.0)
Alkaline Phosphatase: 171 U/L — ABNORMAL HIGH (ref 38–126)
Anion gap: 17 — ABNORMAL HIGH (ref 5–15)
BUN: 66 mg/dL — ABNORMAL HIGH (ref 8–23)
CO2: 24 mmol/L (ref 22–32)
Calcium: 8.4 mg/dL — ABNORMAL LOW (ref 8.9–10.3)
Chloride: 91 mmol/L — ABNORMAL LOW (ref 98–111)
Creatinine, Ser: 5.93 mg/dL — ABNORMAL HIGH (ref 0.44–1.00)
GFR, Estimated: 7 mL/min — ABNORMAL LOW (ref >60)
Glucose, Bld: 156 mg/dL — ABNORMAL HIGH (ref 70–99)
Potassium: 4.9 mmol/L (ref 3.5–5.1)
Sodium: 132 mmol/L — ABNORMAL LOW (ref 135–145)
Total Bilirubin: 0.9 mg/dL (ref 0.3–1.2)
Total Protein: 6.2 g/dL — ABNORMAL LOW (ref 6.5–8.1)

## 2022-09-14 LAB — GLUCOSE, CAPILLARY
Glucose-Capillary: 146 mg/dL — ABNORMAL HIGH (ref 70–99)
Glucose-Capillary: 138 mg/dL — ABNORMAL HIGH (ref 70–99)
Glucose-Capillary: 213 mg/dL — ABNORMAL HIGH (ref 70–99)

## 2022-09-14 LAB — HEPATITIS B SURFACE ANTIGEN: Hepatitis B Surface Ag: NONREACTIVE

## 2022-09-14 LAB — PROCALCITONIN: Procalcitonin: 12.68 ng/mL

## 2022-09-14 LAB — MAGNESIUM: Magnesium: 2.1 mg/dL (ref 1.7–2.4)

## 2022-09-14 MED ORDER — AMOXICILLIN-POT CLAVULANATE 875-125 MG PO TABS: 1.0000 | ORAL_TABLET | Freq: Two times a day (BID) | ORAL | Status: DC

## 2022-09-14 MED ORDER — AMOXICILLIN-POT CLAVULANATE 500-125 MG PO TABS
1.0000 | ORAL_TABLET | Freq: Every day | ORAL | Status: DC
  Administered 2022-09-14 – 2022-09-17 (×4): 500 mg via ORAL
  Filled 2022-09-14 (×4): qty 1

## 2022-09-14 NOTE — NC FL2 (Signed)
[place] MEDICAID FL2 LEVEL OF CARE SCREENING TOOL     IDENTIFICATION  Patient Name: [name] Birthdate: Nov 20, 1951 Sex: female Admission Date (Current Location): 09/10/2022  [facility] and [place] Number:  Producer, television/film/video and Address:  The [facility], [address] [place], [place] [address]      Provider Number: [id number]  Attending Physician Name and Address:  Rhetta Mura, MD  Relative Name and Phone Number:  Wynelle Fanny (Sister)   [phone]    Current Level of Care: Hospital Recommended Level of Care: Skilled Nursing Facility Prior Approval Number:    Date Approved/Denied:   PASRR Number: [id number]  Discharge Plan: SNF    Current Diagnoses: Patient Active Problem List   Diagnosis Date Noted   Transaminitis 09/10/2022   Dermatitis 05/10/2022   Physical deconditioning 05/07/2022   Hypervolemia associated with renal insufficiency 05/06/2022   DNR (do not resuscitate) 05/06/2022   Septic shock vs. hypovolemic shock in setting of covid 19     MSSA bacteremia    Vascular graft infection  and MSSA bacteremia 04/26/2022   Arteriovenous fistula, acquired (HCC) 02/19/2022   Cyclical vomiting syndrome 02/19/2022   Dysphagia 02/19/2022   Anemia of chronic renal failure 02/19/2022   Hypertensive heart and chronic kidney disease with heart failure and with stage 5 chronic kidney disease, or end stage renal disease (HCC) 02/19/2022   Mild cognitive impairment 02/19/2022   Major depression, single episode 02/19/2022   Fall at home, initial encounter 01/23/2022   Acute on chronic diastolic CHF 11/08/2021   GERD (gastroesophageal reflux disease)    Other disorders of calcium metabolism 10/17/2021   Protein-calorie malnutrition, severe 08/12/2021   Acute respiratory failure with hypoxia (HCC) 08/06/2021   Major depressive disorder, recurrent episode, moderate (HCC) 02/23/2021   Prolonged QT interval 02/21/2021   Allergy,  unspecified, initial encounter 08/08/2020   NSTEMI 05/14/2020   Hypotension 03/16/2020   Ventricular tachycardia (HCC) 03/15/2020   Seizure (HCC) 03/15/2020   Complication of vascular access for dialysis 12/21/2019   Breakdown (mechanical) of surgically created arteriovenous fistula, initial encounter (HCC) 12/18/2019   Weakness 10/11/2019   Aneurysm artery, 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 difficile diarrhea 04/17/2019   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   Primary generalized (osteo)arthritis 04/17/2019   Pure hypercholesterolemia, unspecified 04/17/2019   Secondary hyperparathyroidism of renal origin (HCC) 04/17/2019   Gastro-esophageal reflux disease without esophagitis 04/17/2019   Essential (primary) hypertension 04/17/2019   Transient cerebral ischemic attack, unspecified 04/17/2019   Prolonged Q-T interval on ECG 02/24/2017   Malnutrition of moderate degree 12/24/2016   Problem with dialysis access (HCC) 12/21/2016   Neurologic abnormality 11/19/2015   Anxiety 11/19/2015   Insomnia 11/19/2015   Gait instability    Dizziness 05/09/2015   Ataxia 05/09/2015   H/O: CVA (cerebrovascular accident) 05/09/2015   Left facial numbness 05/09/2015   Left leg numbness 05/09/2015   Hyperlipidemia    SOB (shortness of breath) 04/01/2013   ESRD on dialysis (HCC) 11/07/2012   Dyspnea 12/31/2011    Orientation RESPIRATION BLADDER Height & Weight     Self, Time, Place  O2 (nasal cannula 2L) Continent Weight: 110 lb 10.7 oz (50.2 kg) Height:  5\' 3"  (160 cm)  BEHAVIORAL SYMPTOMS/MOOD NEUROLOGICAL BOWEL NUTRITION  STATUS      Continent Diet (see d/c summary)  AMBULATORY STATUS COMMUNICATION OF  NEEDS Skin   Extensive Assist   Normal                       Personal Care Assistance Level of Assistance  Bathing, Feeding, Dressing Bathing Assistance: Limited assistance Feeding assistance: Independent Dressing Assistance: Limited assistance     Functional Limitations Info  Speech, Hearing, Sight Sight Info: Impaired Hearing Info: Adequate Speech Info: Adequate    SPECIAL CARE FACTORS FREQUENCY  PT (By licensed PT), OT (By licensed OT)     PT Frequency: 5x/ week OT Frequency: 5x/ week            Contractures Contractures Info: Not present    Additional Factors Info  Code Status, Allergies, Insulin Sliding Scale, Isolation Precautions Code Status Info: Full Allergies Info: Sulfa Antibiotics Medium Contraindication ; Adhesive (tape)   Insulin Sliding Scale Info: see d/c medication list Isolation Precautions Info: COVID 19+; MRSA     Current Medications (09/14/2022):  This is the current hospital active medication list Current Facility-Administered Medications  Medication Dose Route Frequency Provider Last Rate Last Admin   (feeding supplement) PROSource Plus liquid 30 mL  30 mL Oral BID BM Julien Nordmann, PA-C   30 mL at 09/14/22 1425   albuterol (VENTOLIN HFA) 108 (90 Base) MCG/ACT inhaler 1-2 puff  1-2 puff Inhalation Q6H PRN Tobey Grim, NP       amoxicillin-clavulanate (AUGMENTIN) 500-125 MG per tablet 500 mg  1 tablet Oral Daily Rhetta Mura, MD   500 mg at 09/14/22 1242   benzonatate (TESSALON) capsule 200 mg  200 mg Oral BID PRN Tobey Grim, NP   200 mg at 09/12/22 1003   Chlorhexidine Gluconate Cloth 2 % PADS 6 each  6 each Topical Daily Tobey Grim, NP   6 each at 09/13/22 1230   clopidogrel (PLAVIX) tablet 75 mg  75 mg Oral Daily Tobey Grim, NP   75 mg at 09/14/22 0849   dexamethasone (DECADRON) tablet 6 mg  6 mg Oral Daily Jovita Kussmaul M, NP   6 mg at 09/14/22 1104   dextromethorphan-guaiFENesin  (MUCINEX DM) 30-600 MG per 12 hr tablet 1 tablet  1 tablet Oral BID Tobey Grim, NP   1 tablet at 09/14/22 0849   guaiFENesin (ROBITUSSIN) 100 MG/5ML liquid 5 mL  5 mL Oral Q4H PRN Tobey Grim, NP   5 mL at 09/14/22 0359   heparin injection 5,000 Units  5,000 Units Subcutaneous Q8H Tobey Grim, NP   5,000 Units at 09/14/22 1242   hydrOXYzine (ATARAX) tablet 10 mg  10 mg Oral TID PRN Tobey Grim, NP   10 mg at 09/12/22 1342   insulin aspart (novoLOG) injection 0-9 Units  0-9 Units Subcutaneous TID AC & HS Tobey Grim, NP   1 Units at 09/14/22 1243   midodrine (PROAMATINE) tablet 15 mg  15 mg Oral Q8H Tobey Grim, NP   15 mg at 09/14/22 1410   ondansetron (ZOFRAN) injection 4 mg  4 mg Intravenous Q8H PRN Tobey Grim, NP       pantoprazole (PROTONIX) EC tablet 40 mg  40 mg Oral Daily Tobey Grim, NP   40 mg at 09/14/22 0849   sevelamer carbonate (RENVELA) tablet 800 mg  800 mg Oral TID WC Tobey Grim, NP   800 mg at 09/14/22  1242     Discharge Medications: Please see discharge summary for a list of discharge medications.  Relevant Imaging Results:  Relevant Lab Results:   Additional Information [id number] at [facility] on MWF.  Catalina Pizza Marquita Lias, LCSWA

## 2022-09-14 NOTE — Evaluation (Signed)
Physical Therapy Evaluation Patient Details Name: [name] MRN: [id number] DOB: 1951-01-11 Today's Date: 09/14/2022  History of Present Illness  Pt is a 71 y/o female who presented with SOB, diarrhea, and nausea. Pt initially admitted to ICU due to hypotension and hypoxia. Found to be COVID+ and septic. PMH: ESRD (HD MWF), CKD, CVA, HTN, CHF, HLD.  Clinical Impression   Pt admitted secondary to problem above with deficits below. PTA patient was ?ambulating with rollator (per patient, but with significant left neglect this is questionable).  Pt currently requires min assist for bed mobility with rail and HOB elevated, min assist to stand with RW, and was unable to coordinate taking steps with RW with +1 person assist. Based on current presentation, recommend SNF for continued therapies.  Anticipate patient will benefit from PT to address problems listed below.Will continue to follow acutely to maximize functional mobility independence and safety.          Recommendations for follow up therapy are one component of a multi-disciplinary discharge planning process, led by the attending physician.  Recommendations may be updated based on patient status, additional functional criteria and insurance authorization.  Follow Up Recommendations Skilled nursing-short term rehab (<3 hours/day) Can patient physically be transported by private vehicle: No    Assistance Recommended at Discharge Frequent or constant Supervision/Assistance  Patient can return home with the following  Two people to help with walking and/or transfers;Assistance with cooking/housework;Assistance with feeding;Direct supervision/assist for medications management;Direct supervision/assist for financial management;Assist for transportation;Help with stairs or ramp for entrance    Equipment Recommendations None recommended by PT  Recommendations for Other Services       Functional Status Assessment Patient has had a recent  decline in their functional status and demonstrates the ability to make significant improvements in function in a reasonable and predictable amount of time.     Precautions / Restrictions Precautions Precautions: Fall;Other (comment) Precaution Comments: L inattention, LUE weakness Restrictions Weight Bearing Restrictions: No      Mobility  Bed Mobility Overal bed mobility: Needs Assistance Bed Mobility: Supine to Sit, Sit to Supine     Supine to sit: Mod assist, HOB elevated Sit to supine: Min assist   General bed mobility comments: Pt able to grab bed rail to pull self forward, assist needed to scoot to EOB. Pt attempted to return to supine x 2 during attempts to stand and trial OOB tasks; denied dizziness but reported fatigue    Transfers Overall transfer level: Needs assistance Equipment used: Rolling walker (2 wheels) Transfers: Sit to/from Stand Sit to Stand: Min assist           General transfer comment: Steadying assist x 2 reps. Used RW based on pt reports of Rollator use; required assist to place L hand on walker in standing. unable to sequence/initiate steps and quickly returned to sitting EOB each time    Ambulation/Gait               General Gait Details: unable to coordinate or understand how to step sideways along EOB  Stairs            Wheelchair Mobility    Modified Rankin (Stroke Patients Only)       Balance Overall balance assessment: Needs assistance Sitting-balance support: No upper extremity supported, Feet supported Sitting balance-Leahy Scale: Poor Sitting balance - Comments: varied from Min A to supervision w/ pt impulsively laying back down during session   Standing balance support: During functional activity, Bilateral upper  extremity supported, Reliant on assistive device for balance Standing balance-Leahy Scale: Poor                               Pertinent Vitals/Pain Pain Assessment Pain Assessment:  No/denies pain    Home Living Family/patient expects to be discharged to:: Private residence Living Arrangements: Parent (mother who has dementia per chart) Available Help at Discharge: Family;Available PRN/intermittently (*pt denied aide with PT) Type of Home: House Home Access: Ramped entrance       Home Layout: One level Home Equipment: Rollator (4 wheels);Shower seat;Cane - single point;Grab bars - toilet;Grab bars - tub/shower;BSC/3in1;Wheelchair - manual;Rolling Walker (2 wheels) Additional Comments: Info from prior hospital stay due to pt poor historian. reports she lives with her mother who assists her with ADLs and sister who helps her with her mother??    Prior Function Prior Level of Function : Patient poor historian/Family not available;Needs assist             Mobility Comments: Pt reports short household mobility with Rollator use though d/t L inattention/weakness, unsure of how pt mobilizing ADLs Comments: Pt reports her mother assists her with her ADLs, sponge bathes at baseline. later reported an aide helps her mother with the ADLs that she is assisted with but per chart, aide assists pt with ADLs     Hand Dominance   Dominant Hand: Right    Extremity/Trunk Assessment   Upper Extremity Assessment Upper Extremity Assessment: Defer to OT evaluation LUE Deficits / Details: from prior CVA, some motion noted in hand, difficulty forming full fist though PROM WFL. Some limitations noted in elbow extension and shoulder abd/flexion with pt endorsing this UE is sore. fistula in upper arm. Pt with some inattention noted, leaving this UE behind with bed mobility LUE Sensation: decreased proprioception LUE Coordination: decreased fine motor;decreased gross motor    Lower Extremity Assessment Lower Extremity Assessment: Generalized weakness (pt with difficulty following commands for MMT, but no appreciable difference between LLE and RLE)    Cervical / Trunk  Assessment Cervical / Trunk Assessment: Kyphotic  Communication   Communication: No difficulties  Cognition Arousal/Alertness: Awake/alert Behavior During Therapy: WFL for tasks assessed/performed Overall Cognitive Status: No family/caregiver present to determine baseline cognitive functioning Area of Impairment: Attention, Memory, Following commands, Safety/judgement, Awareness, Problem solving                   Current Attention Level: Sustained Memory: Decreased short-term memory Following Commands: Follows one step commands inconsistently, Follows one step commands with increased time Safety/Judgement: Decreased awareness of safety, Decreased awareness of deficits Awareness: Intellectual Problem Solving: Slow processing, Difficulty sequencing, Requires verbal cues, Decreased initiation, Requires tactile cues General Comments: Pt pleasant, participatory. Inconsistent following of commands, poor motor planning and initiation--especially with left extremities        General Comments General comments (skin integrity, edema, etc.): on 2L oxygen throughout    Exercises     Assessment/Plan    PT Assessment Patient needs continued PT services  PT Problem List Decreased strength;Decreased activity tolerance;Decreased balance;Decreased mobility;Decreased cognition;Decreased knowledge of use of DME;Decreased safety awareness;Decreased knowledge of precautions;Impaired tone;Impaired sensation       PT Treatment Interventions DME instruction;Gait training;Functional mobility training;Therapeutic activities;Therapeutic exercise;Balance training;Neuromuscular re-education;Cognitive remediation;Patient/family education    PT Goals (Current goals can be found in the Care Plan section)  Acute Rehab PT Goals Patient Stated Goal: unable to state PT Goal Formulation: Patient  unable to participate in goal setting Time For Goal Achievement: 09/28/22 Potential to Achieve Goals: Good     Frequency Min 3X/week (until SNF confirmed)     Co-evaluation               AM-PAC PT "6 Clicks" Mobility  Outcome Measure Help needed turning from your back to your side while in a flat bed without using bedrails?: A Little Help needed moving from lying on your back to sitting on the side of a flat bed without using bedrails?: A Little Help needed moving to and from a bed to a chair (including a wheelchair)?: A Lot Help needed standing up from a chair using your arms (e.g., wheelchair or bedside chair)?: A Little Help needed to walk in hospital room?: Total Help needed climbing 3-5 steps with a railing? : Total 6 Click Score: 13    End of Session Equipment Utilized During Treatment: Gait belt;Oxygen Activity Tolerance: Patient limited by fatigue Patient left: in bed;with call bell/phone within reach;with bed alarm set Nurse Communication: Mobility status;Other (comment) (returned to bed due to pending dialysis transport) PT Visit Diagnosis: Unsteadiness on feet (R26.81);Other abnormalities of gait and mobility (R26.89);Other symptoms and signs involving the nervous system (R29.898)    Time: 7412-8786 PT Time Calculation (min) (ACUTE ONLY): 28 min   Charges:   PT Evaluation $PT Eval Moderate Complexity: 1 Mod PT Treatments $Therapeutic Activity: 8-22 mins         Arby Barrette, PT Acute Rehabilitation Services  Office [phone]   Rexanne Mano 09/14/2022, 9:29 AM

## 2022-09-14 NOTE — Progress Notes (Signed)
Pt ended HD at 3.5 hours. Order was 4 hours. Jonnie Finner, MD notified. Pt alert, no c/o, stable. Report to primary RN.  Start: 1430 End:1805 1649m removed 76.7L BVP 50.1kg bed weight No HD meds ordered

## 2022-09-14 NOTE — Progress Notes (Signed)
Pt requests to end HD treatment 30 minutes early today. Stated her treatments are "3.5 hours". Tx time ordered was 4 hours. Jonnie Finner, MD notified via secure chat. Pt alert, no c/o, safety maintained.

## 2022-09-14 NOTE — Progress Notes (Signed)
Pt repeatedly bending right access arm during HD. Arterial alarms too high. Tx paused and arterial needle readjusted. Tx restarted at BFR of 350. Pt alert, no c/o, stable, HD RN at bedside. Safety maintained.

## 2022-09-14 NOTE — Progress Notes (Signed)
Pre HD RN assessment 

## 2022-09-14 NOTE — Telephone Encounter (Signed)
Received fax from Dr. Madelon Lips for Left AVF ligation.

## 2022-09-14 NOTE — Progress Notes (Signed)
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 difficile colitis in addition Patient was discharged to skilled facility and eventually returned home  Worsening shortness of breath abdominal pain nausea vomiting emergency room found to be COVID-positive Tmax 100.8 started on vancomycin and cefepime Also started on Tocilizumab Decadron Was on pressors transiently 9/29 until 9/30 and this was discontinued  Transferred to [facility] on 10[facility]1   Hospital-Problem based course  septic shock on admission blood cultures negative urine culture not collected because does not produce urine Patient remains on midodrine -- de-escalated from IV to oral Augmentin twice daily to complete a total of 7 days ending 09/16/2022 CT chest showing no PE possible underlying infection midodrine dose can be weaned from 33- probably in the next day or so shock physiology resolved judicious fluids  covid-19 continue decadron for total of 10 days ending 10/7--precaution can end up on the same date prone at least 16 hours a day she only received 1 dose of Tocilizumab Symptomatic treatment with Mucinex guaifenesin Tessalon and albuterol  esrd mwf, history of transplant, history of av graft failure and infection hyperkalemia hyperkalemia being managed at dialysis Recheck labs in the morning, continue renvela 800 3 times daily  Recurrent CVAs Continue Plavix 75 daily, atorvastatin 80 daily  Elevated LFTs probably from COVID Repeat labs a.m. bilirubin is  normal-expect will trend downwards  NSTEMI on admission troponins in the 400 range EKG showed no ST-T wave changes As not having chest pain would attribute to this being an acute phase reactant secondary to the infection-ino further work-up at this time  DVT prophylaxis: Heparin Code Status: Full Family Communication: None Disposition:  Status is: Inpatient Remains inpatient appropriate because:   Still symptomatic requires down titration of meds etc.   Consultants:  Renal  Procedures: None  Antimicrobials: Cefepime   Subjective:  She is a little bit more confused than yesterday-she is not really making much sense Does not seem to be in any distress no chest pain cannot orient where she has  Objective: Vitals:   09/13/22 1656 09/13/22 2201 09/14/22 0547 09/14/22 1109  BP: (!) 109/30 (!) 134/42 (!) 139/50 (!) 140/52  Pulse: 61 (!) 57 60 66  Resp: '19 16 16 17  '$ Temp: 98.1 F (36.7 C) 98.4 F (36.9 C) 98.4 F (36.9 C) 97.8 F (36.6 C)  TempSrc: Oral   Oral  SpO2: 91% 92% 92% 98%  Weight:      Height:        Intake/Output Summary (Last 24 hours) at 09/14/2022 1322 Last data filed at 09/14/2022 0920 Gross per 24 hour  Intake 820 ml  Output 0 ml  Net 820 ml    Filed Weights   09/10/22 1225 09/12/22 0455  Weight: 46.1 kg 50.2 kg    Examination:  Cachectic black female looking older than stated age No distress no icterus no pallor Chest clear no added sound Abdomen soft no rebound no guarding No lower extremity edema She does have some hemineglect on the left  Data Reviewed: personally reviewed   CBC    Component Value  Date/Time   WBC 6.6 09/14/2022 0537   RBC 3.31 (L) 09/14/2022 0537   HGB 10.6 (L) 09/14/2022 0537   HGB 10.6 (L) 07/13/2018 0814   HGB 8.7 (L) 09/24/2011 1038   HCT 32.1 (L) 09/14/2022 0537   HCT 33.0 (L) 07/13/2018 0814   HCT 27.3 (L) 09/24/2011 1038   PLT 200 09/14/2022 0537   PLT 203 07/13/2018 0814   MCV 97.0 09/14/2022 0537    MCV 91 07/13/2018 0814   MCV 94.5 09/24/2011 1038   MCH 32.0 09/14/2022 0537   MCHC 33.0 09/14/2022 0537   RDW 19.0 (H) 09/14/2022 0537   RDW 16.9 (H) 07/13/2018 0814   RDW 20.1 (H) 09/24/2011 1038   LYMPHSABS 0.6 (L) 09/14/2022 0537   LYMPHSABS 1.1 09/24/2011 1038   MONOABS 0.4 09/14/2022 0537   MONOABS 0.6 09/24/2011 1038   EOSABS 0.0 09/14/2022 0537   EOSABS 0.1 09/24/2011 1038   BASOSABS 0.0 09/14/2022 0537   BASOSABS 0.0 09/24/2011 1038      Latest Ref Rng & Units 09/14/2022    5:37 AM 09/13/2022   10:17 AM 09/12/2022    9:53 AM  CMP  Glucose 70 - 99 mg/dL 156  166  135   BUN 8 - 23 mg/dL 66  51  28   Creatinine 0.44 - 1.00 mg/dL 5.93  4.77  3.18   Sodium 135 - 145 mmol/L 132  136  133   Potassium 3.5 - 5.1 mmol/L 4.9  4.5  3.9   Chloride 98 - 111 mmol/L 91  93  94   CO2 22 - 32 mmol/L '24  24  25   '$ Calcium 8.9 - 10.3 mg/dL 8.4  8.3  7.7   Total Protein 6.5 - 8.1 g/dL 6.2  6.5    Total Bilirubin 0.3 - 1.2 mg/dL 0.9  0.8    Alkaline Phos 38 - 126 U/L 171  177    AST 15 - 41 U/L 182  278    ALT 0 - 44 U/L 138  190       Radiology Studies: No results found.   Scheduled Meds:  (feeding supplement) PROSource Plus  30 mL Oral BID BM   amoxicillin-clavulanate  1 tablet Oral Daily   Chlorhexidine Gluconate Cloth  6 each Topical Daily   clopidogrel  75 mg Oral Daily   dexamethasone  6 mg Oral Daily   dextromethorphan-guaiFENesin  1 tablet Oral BID   heparin injection (subcutaneous)  5,000 Units Subcutaneous Q8H   insulin aspart  0-9 Units Subcutaneous TID AC & HS   midodrine  15 mg Oral Q8H   pantoprazole  40 mg Oral Daily   sevelamer carbonate  800 mg Oral TID WC   Continuous Infusions:     LOS: 4 days   Time spent: 69  Nita Sells, MD [facility] To contact the attending provider between 7A-7P or the covering provider during after hours 7P-7A, please log into the web site [email or web address] and access using universal [place] password for that  web site. If you do not have the password, please call the hospital operator.  09/14/2022, 1:22 PM

## 2022-09-14 NOTE — Progress Notes (Signed)
Palliative Medicine Progress Note   Patient Name: [name]       Date: 09/14/2022 DOB: 03-25-1951  Age: 71 y.o. MRN#: [id number] Attending Physician: Kathryn Sells, MD Primary Care Physician: Kathryn Carol, MD Admit Date: 09/10/2022  Reason for Consultation/Follow-up: {Reason for Consult:23484}  HPI/Patient Profile: 71 y.o. female  with past medical history of ESRD on HD MWF, chronic diastolic CHF (LVEF 60 to 41%, G2 DD), HLD, and TIA who presented to [facility] ED on 09/10/2022 with shortness of breath, diarrhea, and nausea for the past few days.  Positive for COVID-19 on admission.  Found to be hypoxic at 86% and requiring oxygen at 6 L.  Sepsis protocol initiated in the ED.  CTA negative for PE, but with RUL and RLL micronodules and opacities that are likely infectious/inflammatory.  She remained hypotensive despite fluid resuscitation and required vasopressors. Admitted to the ICU with COVID-19 infection with acute hypoxic respiratory failure and likely septic shock. Palliative Medicine was consulted for goals of care.  Subjective: Chart reviewed and patient assessed at bedside. She tells me she is feeling "much better". She is able to tell me she is in the hospital for "breathing problems".   Objective:  Physical Exam          Vital Signs: BP (!) 140/52 (BP Location: Right Leg)   Pulse 66   Temp 97.8 F (36.6 C) (Oral)   Resp 17   Ht '5\' 3"'$  (1.6 m)   Wt 50.2 kg   SpO2 98%   BMI 19.60 kg/m  SpO2: SpO2: 98 % O2 Device: O2 Device: Nasal Cannula O2 Flow Rate: O2 Flow Rate (L/min): 2 L/min    LBM: Last BM Date :  (PTA)     Palliative Assessment/Data: ***     Palliative Medicine Assessment & Plan   Assessment: Principal Problem:   Septic shock vs. hypovolemic  shock in setting of covid 19  Active Problems:   ESRD on dialysis (HCC)   Hyperlipidemia   H/O: CVA (cerebrovascular accident)   Hypotension   Prolonged QT interval   Acute respiratory failure with hypoxia (HCC)   Anemia of chronic renal failure   Transaminitis    Recommendations/Plan: Full code for now with ongoing discussion Continue current interventions Family's goal is for improvement and for patient to ultimately return home PMT will  continue to follow  Goals of Care and Additional Recommendations: Limitations on Scope of Treatment: {Recommended Scope and Preferences:21019}  Code Status:   Prognosis:  {Palliative Care Prognosis:23504}  Discharge Planning: {Palliative dispostion:23505}  Care plan was discussed with ***  Thank you for allowing the Palliative Medicine Team to assist in the care of this patient.   ***   Kathryn Bullion, NP   Please contact Palliative Medicine Team phone at [phone] for questions and concerns.  For individual providers, please see AMION.

## 2022-09-14 NOTE — Progress Notes (Signed)
Post HD RN assessment 

## 2022-09-14 NOTE — Progress Notes (Signed)
[facility] Progress Note   Subjective:    Seen and examined patient at bedside. Appears comfortable. Denies SOB, CP, or N/V. On 2L O2. Plan for HD today.  Objective Vitals:   09/13/22 1656 09/13/22 2201 09/14/22 0547 09/14/22 1109  BP: (!) 109/30 (!) 134/42 (!) 139/50 (!) 140/52  Pulse: 61 (!) 57 60 66  Resp: '19 16 16 17  '$ Temp: 98.1 F (36.7 C) 98.4 F (36.9 C) 98.4 F (36.9 C) 97.8 F (36.6 C)  TempSrc: Oral   Oral  SpO2: 91% 92% 92% 98%  Weight:      Height:       Physical Exam General: Frail woman, NAD. Nasal O2 in place Heart: RRR; 2/6 murmur Lungs: Coarse air movement, no rales Abdomen: soft Extremities: trace BLE edema Dialysis Access:  RUE AVF + thrill, old L AVG not useable  Filed Weights   09/10/22 1225 09/12/22 0455  Weight: 46.1 kg 50.2 kg    Intake/Output Summary (Last 24 hours) at 09/14/2022 1349 Last data filed at 09/14/2022 0920 Gross per 24 hour  Intake 820 ml  Output 0 ml  Net 820 ml    Additional Objective Labs: Basic Metabolic Panel: Recent Labs  Lab 09/12/22 0410 09/12/22 0953 09/13/22 1017 09/14/22 0537  NA 134* 133* 136 132*  K 5.8* 3.9 4.5 4.9  CL 93* 94* 93* 91*  CO2 20* '25 24 24  '$ GLUCOSE 160* 135* 166* 156*  BUN 81* 28* 51* 66*  CREATININE 7.02* 3.18* 4.77* 5.93*  CALCIUM 8.4* 7.7* 8.3* 8.4*  PHOS 8.3* 4.5  --   --    Liver Function Tests: Recent Labs  Lab 09/12/22 0410 09/12/22 0953 09/13/22 1017 09/14/22 0537  AST 453*  --  278* 182*  ALT 219*  --  190* 138*  ALKPHOS 195*  --  177* 171*  BILITOT 1.0  --  0.8 0.9  PROT 6.3*  --  6.5 6.2*  ALBUMIN 2.9* 2.9* 3.0* 2.9*   No results for input(s): "LIPASE", "AMYLASE" in the last 168 hours. CBC: Recent Labs  Lab 09/10/22 1353 09/10/22 1918 09/11/22 0401 09/12/22 0410 09/13/22 1017 09/14/22 0537  WBC 8.0  --  6.6 8.9 8.5 6.6  NEUTROABS 5.7  --  5.4 7.4 7.2 5.6  HGB 12.4   < > 11.1* 11.1* 11.1* 10.6*  HCT 40.0   < > 33.4* 32.2* 33.1* 32.1*  MCV  102.6*  --  96.5 93.3 95.1 97.0  PLT 183  --  231 231 226 200   < > = values in this interval not displayed.   Blood Culture    Component Value Date/Time   SDES BLOOD RIGHT ARM 09/10/2022 2205   SPECREQUEST  09/10/2022 2205    BOTTLES DRAWN AEROBIC AND ANAEROBIC Blood Culture results may not be optimal due to an excessive volume of blood received in culture bottles   CULT  09/10/2022 2205    NO GROWTH 4 DAYS Performed at [facility], [place] [address] [place], [place] [address]    REPTSTATUS PENDING 09/10/2022 2205    Cardiac Enzymes: No results for input(s): "CKTOTAL", "CKMB", "CKMBINDEX", "TROPONINI" in the last 168 hours. CBG: Recent Labs  Lab 09/13/22 1138 09/13/22 1651 09/13/22 2207 09/14/22 0804 09/14/22 1207  GLUCAP 174* 134* 148* 146* 138*   Iron Studies: No results for input(s): "IRON", "TIBC", "TRANSFERRIN", "FERRITIN" in the last 72 hours. Lab Results  Component Value Date   INR 1.8 (H) 09/10/2022   INR 2.0 (H)  08/18/2022   INR 1.2 05/06/2022   Studies/Results: No results found.  Medications:   (feeding supplement) PROSource Plus  30 mL Oral BID BM   amoxicillin-clavulanate  1 tablet Oral Daily   Chlorhexidine Gluconate Cloth  6 each Topical Daily   clopidogrel  75 mg Oral Daily   dexamethasone  6 mg Oral Daily   dextromethorphan-guaiFENesin  1 tablet Oral BID   heparin injection (subcutaneous)  5,000 Units Subcutaneous Q8H   insulin aspart  0-9 Units Subcutaneous TID AC & HS   midodrine  15 mg Oral Q8H   pantoprazole  40 mg Oral Daily   sevelamer carbonate  800 mg Oral TID WC    Dialysis Orders: Dialyzes at [facility], MWF, 4 hours, 2k 2ca, edw 44.1kg Access; RUE AVG Mircera 30 mcg on 8/11, last Hb 11.3 Hectorol 5 mcg w/each HD Sensipar 30 mg with HD.  Sevelamer 800 mg 2 tabs with each meal.   Assessment/Plan: Sepsis in the setting of COVID-19 positive: Initially on Vanc/Cefepime/Flagyl -> now cefepime alone. Blood Cx 9/28 negative. CTA no  PE, some tree-in-bud opacities and medistinal LAD. S/p Actemra + steroids. Off pressors. ESRD: Back to usual MWF schedule after being off schedule this week and issues with hyperK. Next HD 10/2.  Chronic hyperkalemia in ESRD patient: Resolving with HD and lokelma. K+ today 4.9. Hypotension: on midodrine '15mg'$  TID for now, follow. Anemia of ESRD: Hemoglobin now 10.6. Last dose of ESA back in August. Metabolic Bone Disease/hyperphosphatemia: CorrCa/Phos ok, continue sevelamer.   Nutrition: Alb low, protein supps added.  Tobie Poet, NP [facility] 09/14/2022,1:49 PM  LOS: 4 days

## 2022-09-14 NOTE — Evaluation (Signed)
Occupational Therapy Evaluation Patient Details Name: [name] MRN: [id number] DOB: 16-Apr-1951 Today's Date: 09/14/2022   History of Present Illness Pt is a 71 y/o female who presented with SOB, diarrhea, and nausea. Pt initially admitted to ICU due to hypotension and hypoxia. Found to be COVID+ and septic. PMH: ESRD (HD MWF), CKD, CVA, HTN, CHF, HLD.   Clinical Impression   PTA, pt reports living with family, receiving assistance for ADLs though reports ambulatory with Rollator. Family not present to confirm PLOF. Pt presents now with deficits in cognition, strength, sitting/standing balance, L UE function, and impairments to L visual field. Pt requires Mod A for bed mobility, Min A for standing (unable to hold RW with L hand this AM) and unable to safely sequence taking steps. Pt requires up to Max A for UB ADLs, Total A for LB ADLs and requires assistance for self feeding. Based on current presentation, would recommend SNF rehab to address deconditioning and decrease fall risk if family unable to provide the physical assistance pt currently requires.       Recommendations for follow up therapy are one component of a multi-disciplinary discharge planning process, led by the attending physician.  Recommendations may be updated based on patient status, additional functional criteria and insurance authorization.   Follow Up Recommendations  Skilled nursing-short term rehab (<3 hours/day)    Assistance Recommended at Discharge Frequent or constant Supervision/Assistance  Patient can return home with the following A lot of help with walking and/or transfers;A lot of help with bathing/dressing/bathroom    Functional Status Assessment  Patient has had a recent decline in their functional status and demonstrates the ability to make significant improvements in function in a reasonable and predictable amount of time.  Equipment Recommendations  None recommended by OT    Recommendations for  Other Services       Precautions / Restrictions Precautions Precautions: Fall;Other (comment) Precaution Comments: L inattention, LUE weakness Restrictions Weight Bearing Restrictions: No      Mobility Bed Mobility Overal bed mobility: Needs Assistance Bed Mobility: Supine to Sit, Sit to Supine     Supine to sit: Mod assist, HOB elevated Sit to supine: Min guard   General bed mobility comments: Pt able to grab bed rail to pull self forward, assist needed to scoot to EOB. Pt returned to supine x 2 during attempts to stand and trial OOB tasks    Transfers Overall transfer level: Needs assistance Equipment used: Rolling walker (2 wheels) Transfers: Sit to/from Stand Sit to Stand: Min assist           General transfer comment: Steadying assist. Used RW based on pt reports of Rollator use though pt did not place L hand on walker in standing. unable to sequence/initiate steps      Balance Overall balance assessment: Needs assistance Sitting-balance support: No upper extremity supported, Feet supported Sitting balance-Leahy Scale: Poor Sitting balance - Comments: varied from Min A to supervision w/ pt impulsively laying back down during session   Standing balance support: Single extremity supported, During functional activity Standing balance-Leahy Scale: Poor                             ADL either performed or assessed with clinical judgement   ADL Overall ADL's : Needs assistance/impaired Eating/Feeding: Moderate assistance;Bed level Eating/Feeding Details (indicate cue type and reason): able to take cup from OT's hand with R hand when cued to drink from  it. poor motor planning and anticipate increased difficulty when attempting to use utensils, multiple step tasks Grooming: Moderate assistance;Bed level;Sitting   Upper Body Bathing: Maximal assistance;Sitting   Lower Body Bathing: Maximal assistance;Sitting/lateral leans;Sit to/from stand   Upper Body  Dressing : Maximal assistance;Sitting   Lower Body Dressing: Total assistance;Bed level Lower Body Dressing Details (indicate cue type and reason): able to bring B LE up towards self but poor continuation of task as pt kept placing B feet against footboard while OT attempting to don pt socks     Toileting- Clothing Manipulation and Hygiene: Total assistance;Bed level         General ADL Comments: Limited by L UE and visual field deficits, as well as weakness with poor motor planning     Vision Baseline Vision/History: 1 Wears glasses Ability to See in Adequate Light: 2 Moderately impaired Patient Visual Report: No change from baseline;Other (comment) (L visual field impairments) Vision Assessment?: Vision impaired- to be further tested in functional context Additional Comments: L visual field deficits, neglect of items on L side. Would not scan to L side when cued, some resistance to manual attempts to assist in scanning     Perception     Praxis      Pertinent Vitals/Pain Pain Assessment Pain Assessment: Faces Faces Pain Scale: Hurts a little bit Pain Location: L UE Pain Descriptors / Indicators: Sore Pain Intervention(s): Monitored during session     Hand Dominance Right   Extremity/Trunk Assessment Upper Extremity Assessment Upper Extremity Assessment: LUE deficits/detail LUE Deficits / Details: from prior CVA, some motion noted in hand, difficulty forming full fist though PROM WFL. Some limitations noted in elbow extension and shoulder abd/flexion with pt endorsing this UE is sore. fistula in upper arm. Pt with some inattention noted, leaving this UE behind with bed mobility LUE Sensation: decreased proprioception LUE Coordination: decreased fine motor;decreased gross motor   Lower Extremity Assessment Lower Extremity Assessment: Defer to PT evaluation   Cervical / Trunk Assessment Cervical / Trunk Assessment: Kyphotic   Communication Communication Communication:  No difficulties   Cognition Arousal/Alertness: Awake/alert Behavior During Therapy: Flat affect Overall Cognitive Status: No family/caregiver present to determine baseline cognitive functioning Area of Impairment: Attention, Memory, Following commands, Safety/judgement, Awareness, Problem solving                   Current Attention Level: Sustained Memory: Decreased short-term memory Following Commands: Follows one step commands inconsistently, Follows one step commands with increased time Safety/Judgement: Decreased awareness of safety, Decreased awareness of deficits Awareness: Intellectual Problem Solving: Slow processing, Difficulty sequencing, Requires verbal cues, Decreased initiation, Requires tactile cues General Comments: Pt pleasant, participatory. Inconsistent following of commands, poor motor planning and initiation. memory deficits noted as pt denied being in a rehab place this year (confirmed via chart)     General Comments       Exercises     Shoulder Instructions      Home Living Family/patient expects to be discharged to:: Private residence Living Arrangements: Parent (mother who has dementia per chart) Available Help at Discharge: Family;Available PRN/intermittently;Personal care attendant Type of Home: House Home Access: Ramped entrance     Home Layout: One level     Bathroom Shower/Tub: Sponge bathes at baseline   Bathroom Toilet: Standard Bathroom Accessibility: Yes   Home Equipment: Rollator (4 wheels);Shower seat;Cane - single point;Grab bars - toilet;Grab bars - tub/shower;BSC/3in1;Wheelchair - manual;Rolling Walker (2 wheels)   Additional Comments: Info from prior hospital stay due  to pt poor historian. reports she lives with her mother who assists her with ADLs and has an aide 3x/wk that assists her mother. later reports her sister also helps?      Prior Functioning/Environment Prior Level of Function : Patient poor historian/Family not  available;Needs assist             Mobility Comments: Pt reports short household mobility with Rollator use though d/t L inattention/weakness, unsure of how pt mobilizing ADLs Comments: Pt reports her mother assists her with her ADLs, sponge bathes at baseline. later reported an aide helps her mother with the ADLs that she is assisted with but per chart, aide assists pt with ADLs        OT Problem List: Decreased strength;Impaired balance (sitting and/or standing);Decreased activity tolerance;Impaired vision/perception;Decreased coordination;Decreased cognition;Decreased safety awareness;Decreased knowledge of use of DME or AE;Impaired tone;Impaired UE functional use;Pain      OT Treatment/Interventions: Self-care/ADL training;Energy conservation;DME and/or AE instruction;Therapeutic exercise;Therapeutic activities    OT Goals(Current goals can be found in the care plan section) Acute Rehab OT Goals Patient Stated Goal: feel better soon, get some breakfast OT Goal Formulation: With patient Time For Goal Achievement: 09/28/22 Potential to Achieve Goals: Good  OT Frequency: Min 2X/week    Co-evaluation              AM-PAC OT "6 Clicks" Daily Activity     Outcome Measure Help from another person eating meals?: A Lot Help from another person taking care of personal grooming?: A Lot Help from another person toileting, which includes using toliet, bedpan, or urinal?: Total Help from another person bathing (including washing, rinsing, drying)?: A Lot Help from another person to put on and taking off regular upper body clothing?: A Lot Help from another person to put on and taking off regular lower body clothing?: Total 6 Click Score: 10   End of Session Equipment Utilized During Treatment: Rolling walker (2 wheels) Nurse Communication: Mobility status  Activity Tolerance: Patient limited by fatigue Patient left: in bed;with call bell/phone within reach;with bed alarm set  OT  Visit Diagnosis: Unsteadiness on feet (R26.81);Other abnormalities of gait and mobility (R26.89);Muscle weakness (generalized) (M62.81)                Time: 5093-2671 OT Time Calculation (min): 23 min Charges:  OT General Charges $OT Visit: 1 Visit OT Evaluation $OT Eval Moderate Complexity: 1 Mod  Malachy Chamber, OTR/L Acute Rehab Services Office: [phone]   Layla Maw 09/14/2022, 8:17 AM

## 2022-09-15 DIAGNOSIS — A419 Sepsis, unspecified organism: Secondary | ICD-10-CM | POA: Diagnosis not present

## 2022-09-15 DIAGNOSIS — R6521 Severe sepsis with septic shock: Secondary | ICD-10-CM | POA: Diagnosis not present

## 2022-09-15 LAB — CBC WITH DIFFERENTIAL/PLATELET
Abs Immature Granulocytes: 0.11 10*3/uL — ABNORMAL HIGH (ref 0.00–0.07)
Basophils Absolute: 0 10*3/uL (ref 0.0–0.1)
Basophils Relative: 0 %
Eosinophils Absolute: 0 10*3/uL (ref 0.0–0.5)
Eosinophils Relative: 0 %
HCT: 31.2 % — ABNORMAL LOW (ref 36.0–46.0)
Hemoglobin: 10.4 g/dL — ABNORMAL LOW (ref 12.0–15.0)
Immature Granulocytes: 2 %
Lymphocytes Relative: 8 %
Lymphs Abs: 0.5 10*3/uL — ABNORMAL LOW (ref 0.7–4.0)
MCH: 31.5 pg (ref 26.0–34.0)
MCHC: 33.3 g/dL (ref 30.0–36.0)
MCV: 94.5 fL (ref 80.0–100.0)
Monocytes Absolute: 0.7 10*3/uL (ref 0.1–1.0)
Monocytes Relative: 12 %
Neutro Abs: 4.9 10*3/uL (ref 1.7–7.7)
Neutrophils Relative %: 78 %
Platelets: 172 10*3/uL (ref 150–400)
RBC: 3.3 MIL/uL — ABNORMAL LOW (ref 3.87–5.11)
RDW: 19.1 % — ABNORMAL HIGH (ref 11.5–15.5)
WBC: 6.2 10*3/uL (ref 4.0–10.5)
nRBC: 2.6 % — ABNORMAL HIGH (ref 0.0–0.2)

## 2022-09-15 LAB — CBC
HCT: 33.7 % — ABNORMAL LOW (ref 36.0–46.0)
Hemoglobin: 11 g/dL — ABNORMAL LOW (ref 12.0–15.0)
MCH: 31.9 pg (ref 26.0–34.0)
MCHC: 32.6 g/dL (ref 30.0–36.0)
MCV: 97.7 fL (ref 80.0–100.0)
Platelets: 165 10*3/uL (ref 150–400)
RBC: 3.45 MIL/uL — ABNORMAL LOW (ref 3.87–5.11)
RDW: 19.4 % — ABNORMAL HIGH (ref 11.5–15.5)
WBC: 5.9 10*3/uL (ref 4.0–10.5)
nRBC: 2.2 % — ABNORMAL HIGH (ref 0.0–0.2)

## 2022-09-15 LAB — COMPREHENSIVE METABOLIC PANEL
ALT: 113 U/L — ABNORMAL HIGH (ref 0–44)
AST: 121 U/L — ABNORMAL HIGH (ref 15–41)
Albumin: 3 g/dL — ABNORMAL LOW (ref 3.5–5.0)
Alkaline Phosphatase: 157 U/L — ABNORMAL HIGH (ref 38–126)
Anion gap: 16 — ABNORMAL HIGH (ref 5–15)
BUN: 41 mg/dL — ABNORMAL HIGH (ref 8–23)
CO2: 26 mmol/L (ref 22–32)
Calcium: 9.1 mg/dL (ref 8.9–10.3)
Chloride: 93 mmol/L — ABNORMAL LOW (ref 98–111)
Creatinine, Ser: 3.82 mg/dL — ABNORMAL HIGH (ref 0.44–1.00)
GFR, Estimated: 12 mL/min — ABNORMAL LOW (ref >60)
Glucose, Bld: 143 mg/dL — ABNORMAL HIGH (ref 70–99)
Potassium: 4.5 mmol/L (ref 3.5–5.1)
Sodium: 135 mmol/L (ref 135–145)
Total Bilirubin: 1 mg/dL (ref 0.3–1.2)
Total Protein: 6.4 g/dL — ABNORMAL LOW (ref 6.5–8.1)

## 2022-09-15 LAB — GLUCOSE, CAPILLARY
Glucose-Capillary: 135 mg/dL — ABNORMAL HIGH (ref 70–99)
Glucose-Capillary: 134 mg/dL — ABNORMAL HIGH (ref 70–99)
Glucose-Capillary: 113 mg/dL — ABNORMAL HIGH (ref 70–99)
Glucose-Capillary: 168 mg/dL — ABNORMAL HIGH (ref 70–99)

## 2022-09-15 LAB — CULTURE, BLOOD (ROUTINE X 2)
Culture: NO GROWTH
Culture: NO GROWTH

## 2022-09-15 LAB — MAGNESIUM: Magnesium: 2 mg/dL (ref 1.7–2.4)

## 2022-09-15 MED ORDER — HYDROXYZINE HCL 10 MG PO TABS: 10.0000 mg | ORAL_TABLET | Freq: Three times a day (TID) | ORAL | 0 refills | Status: DC | PRN

## 2022-09-15 MED ORDER — HYDROCORTISONE (PERIANAL) 2.5 % EX CREA
1.0000 | TOPICAL_CREAM | Freq: Four times a day (QID) | CUTANEOUS | Status: DC | PRN
  Administered 2022-09-16: 1 via TOPICAL
  Filled 2022-09-15: qty 28.35

## 2022-09-15 MED ORDER — AMOXICILLIN-POT CLAVULANATE 500-125 MG PO TABS: 1.0000 | ORAL_TABLET | Freq: Every day | ORAL | 0 refills | Status: DC

## 2022-09-15 MED ORDER — BENZONATATE 200 MG PO CAPS: 200.0000 mg | ORAL_CAPSULE | Freq: Two times a day (BID) | ORAL | 0 refills | Status: DC | PRN

## 2022-09-15 MED ORDER — LIDOCAINE-PRILOCAINE 2.5-2.5 % EX CREA
1.0000 | TOPICAL_CREAM | CUTANEOUS | Status: DC | PRN
  Filled 2022-09-15: qty 5

## 2022-09-15 MED ORDER — HEPARIN SODIUM (PORCINE) 1000 UNIT/ML DIALYSIS: 1000.0000 [IU] | INTRAMUSCULAR | Status: DC | PRN

## 2022-09-15 MED ORDER — CHLORHEXIDINE GLUCONATE CLOTH 2 % EX PADS
6.0000 | MEDICATED_PAD | Freq: Every day | CUTANEOUS | Status: DC
  Administered 2022-09-16 – 2022-09-17 (×2): 6 via TOPICAL

## 2022-09-15 MED ORDER — ESCITALOPRAM OXALATE 10 MG PO TABS: 10.0000 mg | ORAL_TABLET | Freq: Every morning | ORAL | 0 refills | Status: DC

## 2022-09-15 MED ORDER — LIDOCAINE HCL (PF) 1 % IJ SOLN: 5.0000 mL | INTRAMUSCULAR | Status: DC | PRN

## 2022-09-15 MED ORDER — ALTEPLASE 2 MG IJ SOLR: 2.0000 mg | Freq: Once | INTRAMUSCULAR | Status: DC | PRN

## 2022-09-15 MED ORDER — DEXAMETHASONE 6 MG PO TABS: 6.0000 mg | ORAL_TABLET | Freq: Every day | ORAL | 0 refills | Status: DC

## 2022-09-15 MED ORDER — PENTAFLUOROPROP-TETRAFLUOROETH EX AERO: 1.0000 | INHALATION_SPRAY | CUTANEOUS | Status: DC | PRN

## 2022-09-15 MED ORDER — DM-GUAIFENESIN ER 30-600 MG PO TB12: 1.0000 | ORAL_TABLET | Freq: Two times a day (BID) | ORAL | Status: DC

## 2022-09-15 MED ORDER — MIDODRINE HCL 5 MG PO TABS: 15.0000 mg | ORAL_TABLET | Freq: Three times a day (TID) | ORAL | Status: DC

## 2022-09-15 MED ORDER — SEVELAMER CARBONATE 800 MG PO TABS: 800.0000 mg | ORAL_TABLET | Freq: Three times a day (TID) | ORAL | Status: DC

## 2022-09-15 NOTE — Progress Notes (Addendum)
Pt receives out-pt HD at [facility] on MWF. Pt arrives at 11:40 for 12:00 chair time. Will assist as needed.   Melven Sartorius Renal Navigator [phone]  Addendum at 12:19 pm: Clinic advised pt is covid positive. Pt's schedule will not change. Pt will need to call the clinic when she arrives and remain in the vehicle until staff comes to get pt to take her inside. Pt cannot sit in the lobby while covid positive. This info was provided to CSW to provide to snf at d/c.

## 2022-09-15 NOTE — TOC Progression Note (Signed)
Transition of Care [facility]) - Initial/Assessment Note    Patient Details  Name: [name] MRN: [id number] Date of Birth: Jun 17, 1951  Transition of Care [facility]) CM/SW Contact:    Milinda Antis, [place] Phone Number: 09/15/2022, 10:42 AM  Clinical Narrative:                 CSW received consult for possible SNF placement at time of discharge.  Due to the patient being confused, CSW spoke with patient' son. Patient's son expressed understanding of PT recommendation and is agreeable to SNF placement at time of discharge. Patient reports preference for [facility]. CSW discussed insurance authorization process and will provide Medicare SNF ratings list. CSW will sent out referrals for review and provided bed offers.    Skilled Nursing Rehab Facilities-   [email or web address]   Ratings out of 5 stars (the highest)   Name Address  Phone # [place] Inspection Overall  [facility] [address] [place] '5 5 2 4  '$ [facility]  [address] [place], [place] [phone] '4 2 5 5  '$ [facility] [place], [place] [phone] '1 1 1 1  '$ [place] Gut[place]rez, [place] '2 2 4 4  '$ [facility] [address] [place] '1 1 2 1  '$ [place] [place] [phone] '3 2 4 4  '$ [facility] [address] [place] '5 1 3 3  '$ [facility] [address], [place] '5 2 3 4  '$ [name] ([place]) [place], [place] [phone] '4 1 2 1  '$ [facility] [phone], [place] [phone] '4 1 1 1  '$ [facility] [address] [facility] [phone] '3 1 2 1  '$ [facility] ([place]) [place]. [place], [place] [phone] '4 1 1 1  '$ Dustin Flock [address] [phone] '4 2 4 4          '$ [place] '4 1 3 2  '$ [facility] [place] [address] [place] '3 1 5 4  '$ [facility], [place] [address], [place] [phone] '1 1 1 1  '$ [facility] [address], [place] [phone] '2 2 3 3          '$ [address] (no [facility]) [address], [place] [phone] '5 5 5 5  '$ [facility] (No Humana) [address] [phone] [address], [place] '4 1 4 3  '$ [facility] (No UHC) [place], [place] [phone] '5 5 5 5  '$ [facility] [address] [phone] '2 3 5 5  '$ [place] [address], [place] '1 1 2 1  '$ [facility] [address] [place] '3 1 1 1  '$ [place], [place] '5 2 4 5  '$ [facility]  [address] [place] '2 2 1 1  '$ [facility] [address], [place] '3 2 1 1  '$ [facility] [place], [place] '2 2 2 2          '$ [facility] [address], [place] '1 1 1 1  '$ [address] [place]  [phone] '2 4 2 2  '$ [facility] [place] [address], [place] [phone] '4 2 3 3  '$ [place], [place] '2 1 1 1  '$ [place] (No Humana) [address], [place] [phone] '2 2 3 3  '$ [facility] [address], [place] [phone] '2 1 1 1          '$ [facility] [place], [place] '5 4 5 5  '$ [facility])  [address], [place] '2 1 2 1  '$ [facility]) [place], [place] '3 1 4 3  '$ [place] [address], [place] '3 3 4 4  '$ [address] [place], [place] '2 3 1 1  '$ [facility]) [address] [place] [phone] '2 1 4 3       '$ Patient Goals and CMS  Choice        Expected Discharge Plan and Services           Expected Discharge Date: 09/15/22                                    Prior Living Arrangements/Services                       Activities of Daily Living      Permission Sought/Granted                  Emotional Assessment              Admission diagnosis:  ESRD on dialysis ([place]) [N18.6, Z99.2] SIRS (systemic inflammatory response syndrome) ([place]) [R65.10] Acute respiratory failure with hypoxia ([place]) [J96.01] Septic shock ([place]) [A41.9, R65.21] Hypotension, unspecified hypotension type [I95.9] Patient Active Problem List   Diagnosis Date Noted   Transaminitis 09/10/2022   Dermatitis 05/10/2022   Physical deconditioning 05/07/2022   Hypervolemia associated with renal insufficiency 05/06/2022   DNR (do not resuscitate) 05/06/2022   Septic shock vs. hypovolemic shock in setting of covid 19     MSSA bacteremia    Vascular graft infection  and MSSA bacteremia 04/26/2022   Arteriovenous fistula, acquired ([place]) 71/69/6789   Cyclical vomiting syndrome 02/19/2022   Dysphagia 02/19/2022   Anemia of chronic renal failure 02/19/2022   Hypertensive heart and chronic kidney disease with heart failure and with stage 5 chronic kidney disease, or end stage renal disease ([place]) 02/19/2022   Mild cognitive impairment 02/19/2022   Major depression, single episode 02/19/2022   Fall at home, initial encounter 01/23/2022   Acute on chronic diastolic CHF 38/09/1750   GERD (gastroesophageal reflux disease)    Other disorders of calcium metabolism 10/17/2021   Protein-calorie malnutrition, severe 08/12/2021   Acute respiratory failure with hypoxia ([place]) 08/06/2021   Major depressive disorder, recurrent episode, moderate (HCC) 02/23/2021   Prolonged QT interval 02/21/2021   Allergy, unspecified, initial encounter 08/08/2020   NSTEMI 05/14/2020   Hypotension 03/16/2020   Ventricular tachycardia ([place])  03/15/2020   Seizure ([place]) 02/58/5277   Complication of vascular access for dialysis 12/21/2019   Breakdown (mechanical) of surgically created arteriovenous fistula, initial encounter ([place]) 12/18/2019   Weakness 10/11/2019   Aneurysm artery, 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 difficile diarrhea 04/17/2019   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   Primary generalized (osteo)arthritis 04/17/2019   Pure hypercholesterolemia, unspecified 04/17/2019   Secondary hyperparathyroidism of renal origin ([place]) 04/17/2019   Gastro-esophageal reflux disease without esophagitis 04/17/2019   Essential (primary) hypertension 04/17/2019   Transient cerebral ischemic attack, unspecified 04/17/2019   Prolonged Q-T interval on ECG 02/24/2017   Malnutrition of moderate degree 12/24/2016   Problem with dialysis access [facility]) 12/21/2016   Neurologic abnormality 11/19/2015   Anxiety 11/19/2015   Insomnia 11/19/2015   Gait instability    Dizziness 05/09/2015   Ataxia 05/09/2015   H/O: CVA (cerebrovascular accident) 05/09/2015   Left facial numbness 05/09/2015   Left leg numbness 05/09/2015   Hyperlipidemia    SOB (shortness of breath) 04/01/2013   ESRD on dialysis ([place]) 11/07/2012   Dyspnea 12/31/2011   PCP:  [name], MD Pharmacy:   [facility] [address], [place] [address] Phone: [phone] Fax: [place], [place] AT [place] [address] Phone: [phone] Fax: [phone]     Social Determinants of Health ([place]) Interventions     Readmission Risk Interventions    11/11/2021    3:49 PM 08/18/2021    3:01 PM  Readmission Risk Prevention Plan  Transportation Screening Complete Complete  Medication Review (RN Care Manager) Complete Complete  PCP or Specialist appointment within 3-5 days of discharge Complete Complete  HRI or Home Care Consult Complete Complete  SW Recovery Care/Counseling Consult Complete Complete  Palliative Care Screening Not Applicable Not Applicable  Skilled Nursing Facility Complete Complete

## 2022-09-15 NOTE — Progress Notes (Signed)
[facility] Progress Note   Subjective:    Seen and examined patient at bedside. Noted patient signed off 42mn early (ran 3.5hrs). Tolerated net UF 1.6L. Currently denies SOB and CP. Next HD 10/4.  Objective Vitals:   09/14/22 1815 09/14/22 2205 09/15/22 0510 09/15/22 0953  BP:  (!) 126/47 (!) 120/45 (!) 140/39  Pulse:  69 64 70  Resp:  15  18  Temp:  98.2 F (36.8 C) 98.1 F (36.7 C) 98.1 F (36.7 C)  TempSrc:   Oral Oral  SpO2:  94% 98% 96%  Weight: 50.1 kg     Height:       Physical Exam General: Frail woman, NAD. Nasal O2 in place Heart: RRR; 2/6 murmur Lungs: Coarse air movement, no rales Abdomen: soft Extremities: trace BLE edema Dialysis Access:  RUE AVF + thrill, old L AVG not useable  Filed Weights   09/10/22 1225 09/12/22 0455 09/14/22 1815  Weight: 46.1 kg 50.2 kg 50.1 kg    Intake/Output Summary (Last 24 hours) at 09/15/2022 1414 Last data filed at 09/15/2022 0915 Gross per 24 hour  Intake 480 ml  Output 1600 ml  Net -1120 ml    Additional Objective Labs: Basic Metabolic Panel: Recent Labs  Lab 09/12/22 0410 09/12/22 0953 09/13/22 1017 09/14/22 0537 09/15/22 0351  NA 134* 133* 136 132* 135  K 5.8* 3.9 4.5 4.9 4.5  CL 93* 94* 93* 91* 93*  CO2 20* '25 24 24 26  '$ GLUCOSE 160* 135* 166* 156* 143*  BUN 81* 28* 51* 66* 41*  CREATININE 7.02* 3.18* 4.77* 5.93* 3.82*  CALCIUM 8.4* 7.7* 8.3* 8.4* 9.1  PHOS 8.3* 4.5  --   --   --    Liver Function Tests: Recent Labs  Lab 09/13/22 1017 09/14/22 0537 09/15/22 0351  AST 278* 182* 121*  ALT 190* 138* 113*  ALKPHOS 177* 171* 157*  BILITOT 0.8 0.9 1.0  PROT 6.5 6.2* 6.4*  ALBUMIN 3.0* 2.9* 3.0*   No results for input(s): "LIPASE", "AMYLASE" in the last 168 hours. CBC: Recent Labs  Lab 09/11/22 0401 09/12/22 0410 09/13/22 1017 09/14/22 0537 09/15/22 0351  WBC 6.6 8.9 8.5 6.6 6.2  NEUTROABS 5.4 7.4 7.2 5.6 4.9  HGB 11.1* 11.1* 11.1* 10.6* 10.4*  HCT 33.4* 32.2* 33.1* 32.1*  31.2*  MCV 96.5 93.3 95.1 97.0 94.5  PLT 231 231 226 200 172   Blood Culture    Component Value Date/Time   SDES BLOOD RIGHT ARM 09/10/2022 2205   SPECREQUEST  09/10/2022 2205    BOTTLES DRAWN AEROBIC AND ANAEROBIC Blood Culture results may not be optimal due to an excessive volume of blood received in culture bottles   CULT  09/10/2022 2205    NO GROWTH 5 DAYS Performed at [facility] [address], [place] [id number]   REPTSTATUS 09/15/2022 FINAL 09/10/2022 2205    Cardiac Enzymes: No results for input(s): "CKTOTAL", "CKMB", "CKMBINDEX", "TROPONINI" in the last 168 hours. CBG: Recent Labs  Lab 09/14/22 0804 09/14/22 1207 09/14/22 2204 09/15/22 0743 09/15/22 1201  GLUCAP 146* 138* 213* 135* 134*   Iron Studies: No results for input(s): "IRON", "TIBC", "TRANSFERRIN", "FERRITIN" in the last 72 hours. Lab Results  Component Value Date   INR 1.8 (H) 09/10/2022   INR 2.0 (H) 08/18/2022   INR 1.2 05/06/2022   Studies/Results: No results found.  Medications:   (feeding supplement) PROSource Plus  30 mL Oral BID BM   amoxicillin-clavulanate  1  tablet Oral Daily   Chlorhexidine Gluconate Cloth  6 each Topical Daily   clopidogrel  75 mg Oral Daily   dexamethasone  6 mg Oral Daily   dextromethorphan-guaiFENesin  1 tablet Oral BID   heparin injection (subcutaneous)  5,000 Units Subcutaneous Q8H   insulin aspart  0-9 Units Subcutaneous TID AC & HS   midodrine  15 mg Oral Q8H   pantoprazole  40 mg Oral Daily   sevelamer carbonate  800 mg Oral TID WC    Dialysis Orders: [facility], MWF, 4 hours, 2k 2ca, edw 44.1kg Access; RUE AVG Mircera 30 mcg on 8/11, last Hb 11.3 Hectorol 5 mcg w/each HD Sensipar 30 mg with HD.  Sevelamer 800 mg 2 tabs with each meal.   Assessment/Plan: Sepsis in the setting of COVID-19 positive: Initially on Vanc/Cefepime/Flagyl -> now cefepime alone. Blood Cx 9/28 negative. CTA no PE, some tree-in-bud opacities and  medistinal LAD. S/p Actemra + steroids. Off pressors. ESRD: Back to usual MWF schedule after being off schedule this week and issues with hyperK. Next HD 10/4.  Chronic hyperkalemia in ESRD patient: Resolving with HD and lokelma. K+ today 4.5. Hypotension: on midodrine '15mg'$  TID for now, follow. Anemia of ESRD: Hemoglobin now 10.4. Last dose of ESA back in August. Metabolic Bone Disease/hyperphosphatemia: CorrCa/Phos ok, continue sevelamer.   Nutrition: Alb low, protein supps added.  Tobie Poet, NP [facility] 09/15/2022,2:14 PM  LOS: 5 days

## 2022-09-15 NOTE — Discharge Summary (Signed)
Physician Discharge Summary  [name] IFO:[id number] DOB: [birth year] DOA: 09/10/2022  PCP: [name], MD  Admit date: 09/10/2022 Discharge date: 09/15/2022  Time spent: 36 minutes  Recommendations for Outpatient Follow-up:  Requires 1 more day of Augmentin in the outpatient setting to complete treatment for presumed bacterial pneumonia Recommend completion of 10 days of Decadron as per [facility] ending on 10/7 Needs Chem-12 CBC in the outpatient setting in about 1 week Please consider reinitiation of blood pressure meds either Coreg or metoprolol as an outpatient--- she was hypotensive this admission and these were held and her losartan was discontinued Noted increase in dosage of midodrine 15 3 times daily and this can be weaned as an outpatient  Discharge Diagnoses:  MAIN problem for hospitalization   Severe sepsis on admission likely secondary to pneumonia and COVID COVID-19 Transaminitis likely secondary to shock Prior CVA ESRD with renal transplant 1996  Please see below for itemized issues addressed in HOpsital- refer to other progress notes for clarity if needed  Discharge Condition: Fair  Diet recommendation: Heart healthy  Filed Weights   09/10/22 1225 09/12/22 0455 09/14/22 1815  Weight: 46.1 kg 50.2 kg 50.1 kg    History of present illness:  71 year old black female ESRD MWF MGUS renal transplant 1996 CAD 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 difficile colitis in addition Patient was discharged to skilled facility and eventually returned home   Worsening shortness of breath abdominal pain nausea vomiting emergency room found to be COVID-positive Tmax 100.8 started on vancomycin and  cefepime Also started on Tocilizumab Decadron Was on pressors transiently 9/29 until 9/30 and this was discontinued   Transferred to [facility] on 10[facility]  Hospital Course:  septic shock on admission blood cultures negative urine culture not collected because does not produce urine Patient remains on midodrine -- de-escalated from IV to oral Augmentin twice daily to complete a total of 7 days ending 09/16/2022 CT chest showing no PE possible underlying infection midodrine dose can be weaned from 75- probably as an outpatient shock physiology resolved judicious fluids   covid-19 continue decadron for total of 10 days ending 10/7 prone at least 16 hours a day she only received 1 dose of Tocilizumab as well as baricitinib Symptomatic treatment with Mucinex guaifenesin Tessalon and albuterol   esrd mwf, history of transplant, history of av graft failure and infection hyperkalemia hyperkalemia being managed at dialysis phosphorus is okay, continue renvela 800 3 times daily   Recurrent CVAs Continue Plavix 75 daily, atorvastatin 80 daily   Elevated LFTs probably from COVID and did receive Actemra Repeat labs a.m. bilirubin is normal-expect will trend downwards   NSTEMI on admission troponins in the 400 range EKG showed no ST-T wave changes As not having chest pain would attribute to this being an acute phase reactant secondary to the infection-if asked chest pain would work-up   Discharge Exam: Vitals:   09/14/22 2205 09/15/22 0510  BP: (!) 126/47 (!) 120/45  Pulse: 69 64  Resp: 15   Temp: 98.2 F (36.8 C) 98.1 F (36.7 C)  SpO2: 94% 98%    Subj on day of d/c   Awake alert coughing some has increased sputum no chest pain She is not on oxygen and seems to be  comfortable   General Exam on discharge  EOMI NCAT no focal deficit no icterus no pallor Decreased air entry posterolaterally but no rales rhonchi or wheeze Abdomen soft no rebound no guarding Neurologically  intact she is a little bit more clear than she was yesterday ROM is intact Psych is euthymic  Discharge Instructions   Discharge Instructions     Diet - low sodium heart healthy   Complete by: As directed    Increase activity slowly   Complete by: As directed       Allergies as of 09/15/2022       Reactions   Sulfa Antibiotics Other (See Comments)   Both pt's parents allergic to this type of medication   Adhesive [tape] Itching        Medication List     STOP taking these medications    acetaminophen 500 MG tablet Commonly known as: TYLENOL   carvedilol 6.25 MG tablet Commonly known as: COREG   cephALEXin 500 MG capsule Commonly known as: KEFLEX   doxycycline 100 MG tablet Commonly known as: VIBRA-TABS   loperamide 2 MG capsule Commonly known as: IMODIUM   losartan 50 MG tablet Commonly known as: COZAAR   metoprolol tartrate 25 MG tablet Commonly known as: LOPRESSOR   ondansetron 8 MG tablet Commonly known as: ZOFRAN   zolpidem 5 MG tablet Commonly known as: AMBIEN       TAKE these medications    albuterol 108 (90 Base) MCG/ACT inhaler Commonly known as: VENTOLIN HFA Inhale 1-2 puffs into the lungs every 6 (six) hours as needed for wheezing or shortness of breath. What changed:  how much to take reasons to take this   albuterol (2.5 MG/3ML) 0.083% nebulizer solution Commonly known as: PROVENTIL Take 3 mLs (2.5 mg total) by nebulization every 4 (four) hours as needed for wheezing. What changed: when to take this   amoxicillin-clavulanate 500-125 MG tablet Commonly known as: AUGMENTIN Take 1 tablet (500 mg total) by mouth daily.   atorvastatin 80 MG tablet Commonly known as: LIPITOR Take 80 mg by mouth at bedtime.   benzonatate 200 MG capsule Commonly known as: TESSALON Take 1 capsule (200 mg total) by mouth 2 (two) times daily as needed for cough.   clopidogrel 75 MG tablet Commonly known as: Plavix Take 1 tablet (75 mg total) by  mouth daily. What changed: when to take this   dexamethasone 6 MG tablet Commonly known as: DECADRON Take 1 tablet (6 mg total) by mouth daily for 4 days.   dextromethorphan-guaiFENesin 30-600 MG 12hr tablet Commonly known as: MUCINEX DM Take 1 tablet by mouth 2 (two) times daily.   Dialyvite 800 0.8 MG Wafr Take 1 tablet by mouth every morning.   escitalopram 10 MG tablet Commonly known as: LEXAPRO Take 1 tablet (10 mg total) by mouth every morning.   hydrOXYzine 10 MG tablet Commonly known as: ATARAX Take 1 tablet (10 mg total) by mouth 3 (three) times daily as needed for itching. What changed:  medication strength how much to take when to take this   midodrine 5 MG tablet Commonly known as: PROAMATINE Take 3 tablets (15 mg total) by mouth every 8 (eight) hours. What changed:  medication strength how much to take when to take this   pantoprazole 40 MG tablet Commonly known as: PROTONIX Take 1 tablet (40 mg total) by mouth daily.   sevelamer carbonate 800 MG tablet Commonly known as: RENVELA Take 1 tablet (800 mg total) by mouth  3 (three) times daily with meals.       Allergies  Allergen Reactions   Sulfa Antibiotics Other (See Comments)    Both pt's parents allergic to this type of medication    Adhesive [Tape] Itching      The results of significant diagnostics from this hospitalization (including imaging, microbiology, ancillary and laboratory) are listed below for reference.    Significant Diagnostic Studies: CT Angio Chest Pulmonary Embolism (PE) W or WO Contrast  Result Date: 09/10/2022 CLINICAL DATA:  High probability for PE. EXAM: CT ANGIOGRAPHY CHEST WITH CONTRAST TECHNIQUE: Multidetector CT imaging of the chest was performed using the standard protocol during bolus administration of intravenous contrast. Multiplanar CT image reconstructions and MIPs were obtained to evaluate the vascular anatomy. RADIATION DOSE REDUCTION: This exam was performed  according to the departmental dose-optimization program which includes automated exposure control, adjustment of the mA and/or kV according to patient size and/or use of iterative reconstruction technique. CONTRAST:  45m OMNIPAQUE IOHEXOL 350 MG/ML SOLN COMPARISON:  Chest x-ray same day.  CT chest 04/26/2022. FINDINGS: Cardiovascular: The aorta is diffusely ectatic without focal dilatation. The heart is enlarged. There is no pericardial effusion. There are atherosclerotic calcifications of the aorta. There is adequate opacification of the pulmonary arteries to the segmental level. There is no evidence for pulmonary embolism. Mediastinum/Nodes: There is an enlarged AP window lymph node measuring 15 mm short axis which is new from prior. There is an enlarged paratracheal lymph node measuring 14 mm, increased in size compared to prior study. Esophagus is nondilated. Visualized thyroid gland is within normal limits. Lungs/Pleura: There is minimal dependent atelectasis in the lower lobes bilaterally. Chronic appearing micro nodules and tree-in-bud opacities are stable in the right upper lobe and right lower lobe no new focal lung infiltrate, pleural effusion or pneumothorax. Upper Abdomen: Severe atherosclerotic calcifications are present. Musculoskeletal: Changes of renal osteodystrophy again seen. No acute fracture. Review of the MIP images confirms the above findings. IMPRESSION: 1. No evidence for pulmonary embolism. 2. New mediastinal lymphadenopathy of uncertain etiology. 3. Cardiomegaly. 4. Stable micro nodules and tree-in-bud opacities in the right upper lobe and right lower lobe, likely infectious/inflammatory. Aortic Atherosclerosis (ICD10-I70.0). Electronically Signed   By: ARonney AstersM.D.   On: 09/10/2022 20:58   DG Chest Portable 1 View  Result Date: 09/10/2022 CLINICAL DATA:  Weakness/hypotension, onset after dialysis yesterday. Two episodes of emesis yesterday. EXAM: PORTABLE CHEST 1 VIEW  COMPARISON:  Chest x-rays dated 09/07/2022, 08/19/2022 and 02/02/2022. FINDINGS: Stable cardiomegaly. Lungs are clear. No pleural effusion or pneumothorax is seen. Visualized osseous structures about the chest are unremarkable. IMPRESSION: No active disease. No evidence of pneumonia or pulmonary edema. Stable cardiomegaly. Electronically Signed   By: SFranki CabotM.D.   On: 09/10/2022 13:36   CT ABDOMEN PELVIS WO CONTRAST  Result Date: 09/07/2022 CLINICAL DATA:  71year old female with acute abdominal pain. EXAM: CT ABDOMEN AND PELVIS WITHOUT CONTRAST TECHNIQUE: Multidetector CT imaging of the abdomen and pelvis was performed following the standard protocol without IV contrast. RADIATION DOSE REDUCTION: This exam was performed according to the departmental dose-optimization program which includes automated exposure control, adjustment of the mA and/or kV according to patient size and/or use of iterative reconstruction technique. COMPARISON:  CT Abdomen and Pelvis 01/23/2022 and earlier. FINDINGS: Lower chest: Chronic cardiomegaly. Tortuous and calcified descending thoracic aorta. No pericardial effusion. Trace layering pleural effusions are new since February. Superimposed chronic lower lobe bronchiectasis which is more apparent on the right. Chronic postinflammatory  appearing alveolar calcifications at both lung bases are stable. Hepatobiliary: Trace perihepatic ascites is new since February. Chronically absent gallbladder. Stable liver with hepatic arterial calcifications. Pancreas: Stable, negative. Spleen: Stable, no splenomegaly. Adrenals/Urinary Tract: Chronic native renal atrophy. A left renal lesion cyst with rim calcification has substantially regressed since February and is now only 3.3 cm diameter (previously 6.3 cm). No pararenal inflammation. Both adrenal glands are difficult to identified. Decompressed urinary bladder. Stomach/Bowel: No dilated large or small bowel. Chronic sigmoid diverticulosis.  No convincing active inflammation on this noncontrast exam. Decompressed stomach. No free air or free fluid. A globular chronic dystrophic calcification of the ventral peritoneal cavity at the anterior pelvis is unchanged since at least 2005. No free air. Vascular/Lymphatic: Severe diffuse calcified atherosclerosis. Aortoiliac calcified atherosclerosis. Negative for abdominal aortic aneurysm. Vascular patency is not evaluated in the absence of IV contrast. No lymphadenopathy identified. Reproductive: Negative noncontrast appearance. Other: No pelvic free fluid. Musculoskeletal: Chronic endplate erosions at N9-G9 in the setting of chronic spondylolisthesis and posterior interspinous spacer device have not significantly changed since 2021. Underlying abnormal bone mineralization, probably chronic renal osteodystrophy. No acute osseous abnormality identified. IMPRESSION: 1. Chronic cardiomegaly, native renal atrophy, severe atherosclerosis, and renal osteodystrophy. 2. Trace layering pleural effusions and perihepatic ascites are new since February. 3. But no other acute or inflammatory process is identified in the noncontrast abdomen or pelvis. Electronically Signed   By: Genevie Ann M.D.   On: 09/07/2022 08:50   DG Chest Portable 1 View  Result Date: 09/07/2022 CLINICAL DATA:  71 year old female with shortness of breath and abdominal pain. EXAM: PORTABLE CHEST 1 VIEW COMPARISON:  Chest radiographs 08/18/2022 and earlier. FINDINGS: Portable AP semi upright view at 0410 hours. Moderate to severe cardiomegaly does not appear significantly changed. Tortuous and calcified thoracic aorta contour is stable. Other mediastinal contours are within normal limits. Stable lung volumes and ventilation. No pneumothorax, pulmonary edema, pleural effusion, or consolidation. Chronic subpleural scarring in the upper lobes appears stable from CT chest 04/26/2022. Paucity of bowel gas in the visible upper abdomen. IMPRESSION: Stable  pronounced Cardiomegaly and Aortic Atherosclerosis (ICD10-I70.0). No acute cardiopulmonary abnormality. Electronically Signed   By: Genevie Ann M.D.   On: 09/07/2022 04:44   DG Abdomen 1 View  Result Date: 08/18/2022 CLINICAL DATA:  Abdominal distention EXAM: ABDOMEN - 1 VIEW COMPARISON:  05/18/2022 FINDINGS: Gas-filled, nonobstructed loops of bowel throughout the abdomen and pelvis. No obvious free air on supine radiograph. No large burden of stool. IMPRESSION: Gas-filled, nonobstructed loops of bowel throughout the abdomen and pelvis. No obvious free air on supine radiograph. No large burden of stool. Electronically Signed   By: Delanna Ahmadi M.D.   On: 08/18/2022 10:40   DG Chest 2 View  Result Date: 08/18/2022 CLINICAL DATA:  Shortness of breath. EXAM: CHEST - 2 VIEW COMPARISON:  07/13/2022 FINDINGS: Lungs are hyperexpanded. The cardio pericardial silhouette is enlarged. Interstitial markings are diffusely coarsened with chronic features. Irregular linear density in the peripheral right upper lobe identified previously is stable in the interval. Bones are diffusely demineralized. IMPRESSION: Hyperexpansion with chronic interstitial changes. No acute cardiopulmonary findings. Stable irregular linear density in the peripheral right upper lobe, likely scar. Follow-up chest x-ray in 3 months recommended to ensure continued stability. Electronically Signed   By: Misty Stanley M.D.   On: 08/18/2022 07:07    Microbiology: Recent Results (from the past 240 hour(s))  C Difficile Quick Screen w PCR reflex     Status: None  Collection Time: 09/07/22  9:56 AM   Specimen: STOOL  Result Value Ref Range Status   C Diff antigen NEGATIVE NEGATIVE Final   C Diff toxin NEGATIVE NEGATIVE Final   C Diff interpretation No C. difficile detected.  Final    Comment: Performed at [facility], [place] [address], [place], [place] [address]  Blood Culture (routine x 2)     Status: None   Collection Time: 09/10/22  1:14  PM   Specimen: BLOOD RIGHT ARM  Result Value Ref Range Status   Specimen Description BLOOD RIGHT ARM  Final   Special Requests   Final    BOTTLES DRAWN AEROBIC ONLY Blood Culture results may not be optimal due to an inadequate volume of blood received in culture bottles   Culture   Final    NO GROWTH 5 DAYS Performed at [facility], [place] [address], [place], [place] [address]    Report Status 09/15/2022 FINAL  Final  SARS Coronavirus 2 by RT PCR (hospital order, performed in [facility] hospital lab) *cepheid single result test* Anterior Nasal Swab     Status: Abnormal   Collection Time: 09/10/22  1:17 PM   Specimen: Anterior Nasal Swab  Result Value Ref Range Status   SARS Coronavirus 2 by RT PCR POSITIVE (A) NEGATIVE Final    Comment: (NOTE) SARS-CoV-2 target nucleic acids are DETECTED  SARS-CoV-2 RNA is generally detectable in upper respiratory specimens  during the acute phase of infection.  Positive results are indicative  of the presence of the identified virus, but do not rule out bacterial infection or co-infection with other pathogens not detected by the test.  Clinical correlation with patient history and  other diagnostic information is necessary to determine patient infection status.  The expected result is negative.  Fact Sheet for Patients:   https://www.patel.info/   Fact Sheet for Healthcare Providers:   https://hall.com/    This test is not yet approved or cleared by the [place] FDA and  has been authorized for detection and/or diagnosis of SARS-CoV-2 by FDA under an Emergency Use Authorization (EUA).  This EUA will remain in effect (meaning this test can be used) for the duration of  the COVID-19 declaration under Section 564(b)(1)  of the Act, 21 U.S.C. section 360-bbb-3(b)(1), unless the authorization is terminated or revoked sooner.   Performed at [facility], [place] [address] [place],  [place] [address]   MRSA Next Gen by PCR, Nasal     Status: None   Collection Time: 09/10/22  8:57 PM   Specimen: Nasal Mucosa; Nasal Swab  Result Value Ref Range Status   MRSA by PCR Next Gen NOT DETECTED NOT DETECTED Final    Comment: (NOTE) The GeneXpert MRSA Assay (FDA approved for NASAL specimens only), is one component of a comprehensive MRSA colonization surveillance program. It is not intended to diagnose MRSA infection nor to guide or monitor treatment for MRSA infections. Test performance is not FDA approved in patients less than 62 years old. Performed at [facility], [place] [address] [place], [place] [address]   Blood Culture (routine x 2)     Status: None   Collection Time: 09/10/22 10:05 PM   Specimen: BLOOD RIGHT ARM  Result Value Ref Range Status   Specimen Description BLOOD RIGHT ARM  Final   Special Requests   Final    BOTTLES DRAWN AEROBIC AND ANAEROBIC Blood Culture results may not be optimal due to  an excessive volume of blood received in culture bottles   Culture   Final    NO GROWTH 5 DAYS Performed at [facility], [place] [address] [place],  [address]    Report Status 09/15/2022 FINAL  Final     Labs: Basic Metabolic Panel: Recent Labs  Lab 09/10/22 1900 09/10/22 1918 09/12/22 0410 09/12/22 0953 09/13/22 1017 09/14/22 0537 09/15/22 0351  NA  --    < > 134* 133* 136 132* 135  K  --    < > 5.8* 3.9 4.5 4.9 4.5  CL  --    < > 93* 94* 93* 91* 93*  CO2  --    < > 20* '25 24 24 26  '$ GLUCOSE  --    < > 160* 135* 166* 156* 143*  BUN  --    < > 81* 28* 51* 66* 41*  CREATININE  --    < > 7.02* 3.18* 4.77* 5.93* 3.82*  CALCIUM  --    < > 8.4* 7.7* 8.3* 8.4* 9.1  MG 2.0  --   --   --  1.9 2.1 2.0  PHOS  --   --  8.3* 4.5  --   --   --    < > = values in this interval not displayed.   Liver Function Tests: Recent Labs  Lab 09/11/22 0401 09/12/22 0410 09/12/22 0953 09/13/22 1017 09/14/22 0537 09/15/22 0351  AST 236* 453*  --  278* 182*  121*  ALT 109* 219*  --  190* 138* 113*  ALKPHOS 221* 195*  --  177* 171* 157*  BILITOT 1.2 1.0  --  0.8 0.9 1.0  PROT 6.5 6.3*  --  6.5 6.2* 6.4*  ALBUMIN 3.2* 2.9* 2.9* 3.0* 2.9* 3.0*   No results for input(s): "LIPASE", "AMYLASE" in the last 168 hours. No results for input(s): "AMMONIA" in the last 168 hours. CBC: Recent Labs  Lab 09/11/22 0401 09/12/22 0410 09/13/22 1017 09/14/22 0537 09/15/22 0351  WBC 6.6 8.9 8.5 6.6 6.2  NEUTROABS 5.4 7.4 7.2 5.6 4.9  HGB 11.1* 11.1* 11.1* 10.6* 10.4*  HCT 33.4* 32.2* 33.1* 32.1* 31.2*  MCV 96.5 93.3 95.1 97.0 94.5  PLT 231 231 226 200 172   Cardiac Enzymes: No results for input(s): "CKTOTAL", "CKMB", "CKMBINDEX", "TROPONINI" in the last 168 hours. BNP: BNP (last 3 results) Recent Labs    02/02/22 0152 05/06/22 1000 09/10/22 1900  BNP >4,500.0* >4,500.0* 3,533.9*    ProBNP (last 3 results) No results for input(s): "PROBNP" in the last 8760 hours.  CBG: Recent Labs  Lab 09/13/22 2207 09/14/22 0804 09/14/22 1207 09/14/22 2204 09/15/22 0743  GLUCAP 148* 146* 138* 213* 135*       Signed:  Nita Sells MD   [facility] 09/15/2022, 9:10 AM

## 2022-09-15 NOTE — TOC Progression Note (Addendum)
Transition of Care [facility]) - Initial/Assessment Note    Patient Details  Name: [name] MRN: [id number] Date of Birth: 1951-11-27  Transition of Care [facility]) CM/SW Contact:    Milinda Antis, [place] Phone Number: 09/15/2022, 10:57 AM  Clinical Narrative:                 CSW contacted [name] to inquire about how long the patient has to quarantine prior to being admitted and is awaiting a response.    As a backup plan, CSW also contacted the other 2 facilities that accepted the patient to inquire about quarantine time frames.  [facility] requires 10 days [facility] is awaiting a returned call  16:00-  [name] can accept the patient after 5 days inpatient.    Patient Goals and CMS Choice        Expected Discharge Plan and Services           Expected Discharge Date: 09/15/22                                    Prior Living Arrangements/Services                       Activities of Daily Living      Permission Sought/Granted                  Emotional Assessment              Admission diagnosis:  ESRD on dialysis ([place]) [N18.6, Z99.2] SIRS (systemic inflammatory response syndrome) (HCC) [R65.10] Acute respiratory failure with hypoxia (HCC) [J96.01] Septic shock ([place]) [A41.9, R65.21] Hypotension, unspecified hypotension type [I95.9] Patient Active Problem List   Diagnosis Date Noted   Transaminitis 09/10/2022   Dermatitis 05/10/2022   Physical deconditioning 05/07/2022   Hypervolemia associated with renal insufficiency 05/06/2022   DNR (do not resuscitate) 05/06/2022   Septic shock vs. hypovolemic shock in setting of covid 19     MSSA bacteremia    Vascular graft infection  and MSSA bacteremia 04/26/2022   Arteriovenous fistula, acquired ([place]) 56/43/3295   Cyclical vomiting syndrome 02/19/2022   Dysphagia 02/19/2022   Anemia of chronic renal failure 02/19/2022   Hypertensive heart and chronic kidney  disease with heart failure and with stage 5 chronic kidney disease, or end stage renal disease ([place]) 02/19/2022   Mild cognitive impairment 02/19/2022   Major depression, single episode 02/19/2022   Fall at home, initial encounter 01/23/2022   Acute on chronic diastolic CHF 18/84/1660   GERD (gastroesophageal reflux disease)    Other disorders of calcium metabolism 10/17/2021   Protein-calorie malnutrition, severe 08/12/2021   Acute respiratory failure with hypoxia ([place]) 08/06/2021   Major depressive disorder, recurrent episode, moderate (HCC) 02/23/2021   Prolonged QT interval 02/21/2021   Allergy, unspecified, initial encounter 08/08/2020   NSTEMI 05/14/2020   Hypotension 03/16/2020   Ventricular tachycardia ([place]) 03/15/2020   Seizure ([place]) 63/12/6008   Complication of vascular access for dialysis 12/21/2019   Breakdown (mechanical) of surgically created arteriovenous fistula, initial encounter ([place]) 12/18/2019   Weakness 10/11/2019   Aneurysm artery, 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 difficile diarrhea 04/17/2019   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   Primary generalized (osteo)arthritis 04/17/2019   Pure hypercholesterolemia, unspecified 04/17/2019   Secondary hyperparathyroidism of renal origin ([place]) 04/17/2019   Gastro-esophageal reflux disease without esophagitis 04/17/2019   Essential (primary) hypertension 04/17/2019   Transient cerebral ischemic attack, unspecified 04/17/2019   Prolonged Q-T interval on ECG 02/24/2017   Malnutrition of moderate degree 12/24/2016   Problem with dialysis access [employer]) 12/21/2016   Neurologic abnormality 11/19/2015   Anxiety 11/19/2015   Insomnia  11/19/2015   Gait instability    Dizziness 05/09/2015   Ataxia 05/09/2015   H/O: CVA (cerebrovascular accident) 05/09/2015   Left facial numbness 05/09/2015   Left leg numbness 05/09/2015   Hyperlipidemia    SOB (shortness of breath) 04/01/2013   ESRD on dialysis ([place]) 11/07/2012   Dyspnea 12/31/2011   PCP:  [name], MD Pharmacy:   [facility] [address], [place] [address] Phone: [phone] Fax: [place] [place] [address] Phone: [phone] Fax: [phone]     Social Determinants of Health ([place]) Interventions    Readmission Risk Interventions    11/11/2021    3:49 PM 08/18/2021    3:01 PM  Readmission Risk Prevention Plan  Transportation Screening Complete Complete  Medication Review (RN Care Manager) Complete Complete  PCP or Specialist appointment within 3-5 days of discharge Complete Complete  HRI or Home Care Consult Complete Complete  SW Recovery Care/Counseling Consult Complete Complete  Palliative Care Screening Not Applicable Not Applicable  Skilled Nursing Facility Complete Complete

## 2022-09-16 DIAGNOSIS — Z8673 Personal history of transient ischemic attack (TIA), and cerebral infarction without residual deficits: Secondary | ICD-10-CM

## 2022-09-16 DIAGNOSIS — D631 Anemia in chronic kidney disease: Secondary | ICD-10-CM

## 2022-09-16 DIAGNOSIS — J9601 Acute respiratory failure with hypoxia: Secondary | ICD-10-CM | POA: Diagnosis not present

## 2022-09-16 DIAGNOSIS — I10 Essential (primary) hypertension: Secondary | ICD-10-CM

## 2022-09-16 DIAGNOSIS — A419 Sepsis, unspecified organism: Secondary | ICD-10-CM | POA: Diagnosis not present

## 2022-09-16 DIAGNOSIS — R6521 Severe sepsis with septic shock: Secondary | ICD-10-CM

## 2022-09-16 DIAGNOSIS — N184 Chronic kidney disease, stage 4 (severe): Secondary | ICD-10-CM | POA: Diagnosis not present

## 2022-09-16 DIAGNOSIS — N186 End stage renal disease: Secondary | ICD-10-CM | POA: Diagnosis not present

## 2022-09-16 DIAGNOSIS — Z992 Dependence on renal dialysis: Secondary | ICD-10-CM

## 2022-09-16 LAB — CBC
HCT: 34.7 % — ABNORMAL LOW (ref 36.0–46.0)
Hemoglobin: 12.1 g/dL (ref 12.0–15.0)
MCH: 32.7 pg (ref 26.0–34.0)
MCHC: 34.9 g/dL (ref 30.0–36.0)
MCV: 93.8 fL (ref 80.0–100.0)
Platelets: 212 10*3/uL (ref 150–400)
RBC: 3.7 MIL/uL — ABNORMAL LOW (ref 3.87–5.11)
RDW: 19.7 % — ABNORMAL HIGH (ref 11.5–15.5)
WBC: 11.2 10*3/uL — ABNORMAL HIGH (ref 4.0–10.5)
nRBC: 2.9 % — ABNORMAL HIGH (ref 0.0–0.2)

## 2022-09-16 LAB — RENAL FUNCTION PANEL
Albumin: 2.8 g/dL — ABNORMAL LOW (ref 3.5–5.0)
Anion gap: 20 — ABNORMAL HIGH (ref 5–15)
BUN: 70 mg/dL — ABNORMAL HIGH (ref 8–23)
CO2: 19 mmol/L — ABNORMAL LOW (ref 22–32)
Calcium: 9.4 mg/dL (ref 8.9–10.3)
Chloride: 92 mmol/L — ABNORMAL LOW (ref 98–111)
Creatinine, Ser: 5.55 mg/dL — ABNORMAL HIGH (ref 0.44–1.00)
GFR, Estimated: 8 mL/min — ABNORMAL LOW (ref >60)
Glucose, Bld: 136 mg/dL — ABNORMAL HIGH (ref 70–99)
Phosphorus: 5.9 mg/dL — ABNORMAL HIGH (ref 2.5–4.6)
Potassium: 4.6 mmol/L (ref 3.5–5.1)
Sodium: 131 mmol/L — ABNORMAL LOW (ref 135–145)

## 2022-09-16 LAB — GLUCOSE, CAPILLARY
Glucose-Capillary: 116 mg/dL — ABNORMAL HIGH (ref 70–99)
Glucose-Capillary: 126 mg/dL — ABNORMAL HIGH (ref 70–99)
Glucose-Capillary: 123 mg/dL — ABNORMAL HIGH (ref 70–99)
Glucose-Capillary: 100 mg/dL — ABNORMAL HIGH (ref 70–99)
Glucose-Capillary: 101 mg/dL — ABNORMAL HIGH (ref 70–99)

## 2022-09-16 MED ORDER — CARVEDILOL 6.25 MG PO TABS
6.2500 mg | ORAL_TABLET | Freq: Two times a day (BID) | ORAL | Status: DC
  Filled 2022-09-16: qty 1

## 2022-09-16 MED ORDER — ESCITALOPRAM OXALATE 10 MG PO TABS
10.0000 mg | ORAL_TABLET | Freq: Every day | ORAL | Status: DC
  Administered 2022-09-17: 10 mg via ORAL
  Filled 2022-09-16: qty 1

## 2022-09-16 MED ORDER — MIDODRINE HCL 5 MG PO TABS
10.0000 mg | ORAL_TABLET | Freq: Three times a day (TID) | ORAL | Status: DC
  Administered 2022-09-16 – 2022-09-17 (×2): 10 mg via ORAL
  Filled 2022-09-16 (×2): qty 2

## 2022-09-16 NOTE — Progress Notes (Signed)
[facility]                                                                              [name], is a 71 y.o. female, DOB - 1951-03-04, NLZ:[id number] Admit date - 09/10/2022    Outpatient Primary MD for the patient is [name], MD  LOS - 6  days  Chief Complaint  Patient presents with   Weakness   Hypotension       Brief summary   Patient is a 71 year old female with ESRD on HD MWF, MGUS, renal transplant in 1996, CAD, prior CVA with left-sided weakness, recurrent CVA, 02/21/2021, paroxysmal A-fib, DCCV 2021 presented with shortness of breath, abdominal pain, nausea and vomiting on 5/28.  Patient was found to be COVID-positive.  Initially started on vancomycin, cefepime.  Also placed on tocilizumab and Decadron.  She was on vasopressors transiently 9/29 to 9/30 and was transferred to [facility] on 10/1.   Assessment & Plan    Principal Problem:   Septic shock vs. hypovolemic shock in setting of covid 19, POA -Antihypertensives held, patient was briefly placed on vasopressors, now off -Remains on midodrine -Initially placed on vancomycin, cefepime then narrowed to oral Augmentin, to complete on 10/4 -CT chest showed no PE -Shock physiology has been improving, BP is now elevated, will taper midodrine to 10 mg 3 times daily -Patient received 1 dose of tocilizumab as well as baricitinib, Decadron -Continue Decadron for total of 10 days, ending 10/7 -Continue symptomatic treatment  Active Problems: Acute respiratory failure with hypoxia secondary to COVID-19 -CTA chest showed stable micronodules and tree-in-bud opacities in the right upper lobe and right lower lobe likely infectious/inflammatory -Continue Decadron, for total 10 days, ending 10/7 -Will complete Augmentin today, continue symptomatic treatment    Hypotension -BP now improving and elevated, taper midodrine down to 10 mg 3 times daily   ESRD, history of renal transplant, AV  graft failure and infection, hyperkalemia -On HD, MWF -Nephrology following  Recurrent CVAs -Continue Plavix , Atorvastatin    Anemia of chronic renal failure -H&H currently stable  NSTEMI on admission -Likely not acute ACS, EKG showed no ST-T wave changes. -Troponin elevation likely due to underlying ESRD  Elevated LFTs -Likely due to #1, improving   Code Status: Full CODE STATUS DVT Prophylaxis:  heparin injection 5,000 Units Start: 09/11/22 1400   Level of Care: Level of care: Telemetry Medical Family Communication: Updated patient   Disposition Plan:      Remains inpatient appropriate: Plan for charge to SNF in a.m.   Procedures:  Hemodialysis  Consultants:   PCCM, nephrology  Antimicrobials:   Anti-infectives (From admission, onward)    Start     Dose/Rate Route Frequency Ordered Stop   09/15/22 0000  amoxicillin-clavulanate (AUGMENTIN) 500-125 MG tablet        1 tablet Oral Daily 09/15/22 0910     09/14/22 1130  amoxicillin-clavulanate (AUGMENTIN) 875-125 MG per tablet 1 tablet  Status:  Discontinued        1 tablet Oral Every 12 hours 09/14/22 1042 09/14/22 1043   09/14/22 1130  amoxicillin-clavulanate (AUGMENTIN) 500-125 MG per tablet 500  mg        1 tablet Oral Daily 09/14/22 1043     09/11/22 1500  ceFEPIme (MAXIPIME) 1 g in sodium chloride 0.9 % 100 mL IVPB  Status:  Discontinued        1 g 200 mL/hr over 30 Minutes Intravenous Every 24 hours 09/10/22 1422 09/14/22 1042   09/11/22 1200  vancomycin (VANCOREADY) IVPB 500 mg/100 mL  Status:  Discontinued        500 mg 100 mL/hr over 60 Minutes Intravenous Every M-W-F (Hemodialysis) 09/10/22 1422 09/11/22 0941   09/10/22 1500  ceFEPIme (MAXIPIME) 2 g in sodium chloride 0.9 % 100 mL IVPB  Status:  Discontinued        2 g 200 mL/hr over 30 Minutes Intravenous  Once 09/10/22 1429 09/10/22 1430   09/10/22 1330  ceFEPIme (MAXIPIME) 2 g in sodium chloride 0.9 % 100 mL IVPB  Status:  Discontinued        2  g 200 mL/hr over 30 Minutes Intravenous  Once 09/10/22 1322 09/10/22 1440   09/10/22 1330  metroNIDAZOLE (FLAGYL) IVPB 500 mg        500 mg 100 mL/hr over 60 Minutes Intravenous  Once 09/10/22 1322 09/10/22 1555   09/10/22 1330  vancomycin (VANCOCIN) IVPB 1000 mg/200 mL premix  Status:  Discontinued        1,000 mg 200 mL/hr over 60 Minutes Intravenous  Once 09/10/22 1322 09/11/22 0944          Medications  (feeding supplement) PROSource Plus  30 mL Oral BID BM   amoxicillin-clavulanate  1 tablet Oral Daily   Chlorhexidine Gluconate Cloth  6 each Topical Q0600   clopidogrel  75 mg Oral Daily   dexamethasone  6 mg Oral Daily   dextromethorphan-guaiFENesin  1 tablet Oral BID   heparin injection (subcutaneous)  5,000 Units Subcutaneous Q8H   insulin aspart  0-9 Units Subcutaneous TID AC & HS   midodrine  15 mg Oral Q8H   pantoprazole  40 mg Oral Daily   sevelamer carbonate  800 mg Oral TID WC      Subjective:   [name] was seen and examined today.  Feels generalized weakness otherwise no acute complaints.  No chest pain or shortness of breath.  No fevers  Objective:   Vitals:   09/15/22 0953 09/15/22 1700 09/16/22 0508 09/16/22 0831  BP: (!) 140/39 (!) 159/59 (!) 158/63 (!) 166/89  Pulse: 70 (!) 58 66   Resp: '18 17 18 13  '$ Temp: 98.1 F (36.7 C) 98.1 F (36.7 C) 97.6 F (36.4 C) (!) 97.4 F (36.3 C)  TempSrc: Oral Oral Oral Axillary  SpO2: 96% 98% 94% 95%  Weight:      Height:        Intake/Output Summary (Last 24 hours) at 09/16/2022 1247 Last data filed at 09/16/2022 0830 Gross per 24 hour  Intake 440 ml  Output --  Net 440 ml     Wt Readings from Last 3 Encounters:  09/14/22 50.1 kg  08/18/22 46.1 kg  07/13/22 45.4 kg     Exam General: Alert and oriented x 3, NAD Cardiovascular: S1 S2 auscultated,  RRR Respiratory: Diminished breath sound at the bases Gastrointestinal: Soft, nontender, nondistended, + bowel sounds Ext: no pedal edema  bilaterally Neuro: no new deficits Psych: Normal affect and demeanor, alert and oriented x3     Data Reviewed:  I have personally reviewed following labs    CBC Lab Results  Component Value Date   WBC 5.9 09/15/2022   RBC 3.45 (L) 09/15/2022   HGB 11.0 (L) 09/15/2022   HCT 33.7 (L) 09/15/2022   MCV 97.7 09/15/2022   MCH 31.9 09/15/2022   PLT 165 09/15/2022   MCHC 32.6 09/15/2022   RDW 19.4 (H) 09/15/2022   LYMPHSABS 0.5 (L) 09/15/2022   MONOABS 0.7 09/15/2022   EOSABS 0.0 09/15/2022   BASOSABS 0.0 40/07/6760     Last metabolic panel Lab Results  Component Value Date   NA 135 09/15/2022   K 4.5 09/15/2022   CL 93 (L) 09/15/2022   CO2 26 09/15/2022   BUN 41 (H) 09/15/2022   CREATININE 3.82 (H) 09/15/2022   GLUCOSE 143 (H) 09/15/2022   GFRNONAA 12 (L) 09/15/2022   GFRAA 7 (L) 07/26/2020   CALCIUM 9.1 09/15/2022   PHOS 4.5 09/12/2022   PROT 6.4 (L) 09/15/2022   ALBUMIN 3.0 (L) 09/15/2022   LABGLOB 2.8 07/13/2018   AGRATIO 1.5 07/13/2018   BILITOT 1.0 09/15/2022   ALKPHOS 157 (H) 09/15/2022   AST 121 (H) 09/15/2022   ALT 113 (H) 09/15/2022   ANIONGAP 16 (H) 09/15/2022    CBG (last 3)  Recent Labs    09/16/22 0514 09/16/22 0733 09/16/22 1112  GLUCAP 116* 126* 123*      Coagulation Profile: Recent Labs  Lab 09/10/22 1353  INR 1.8*     Radiology Studies: I have personally reviewed the imaging studies  No results found.     Estill Cotta M.D. [facility] 09/16/2022, 12:47 PM  Available via Epic secure chat 7am-7pm After 7 pm, please refer to night coverage provider listed on amion.

## 2022-09-16 NOTE — Progress Notes (Signed)
Physical Therapy Treatment Patient Details Name: [name] MRN: [id number] DOB: March 19, 1951 Today's Date: 09/16/2022   History of Present Illness Pt is a 71 y/o female who presented with SOB, diarrhea, and nausea. Pt initially admitted to ICU due to hypotension and hypoxia. Found to be COVID+ and septic. PMH: ESRD (HD MWF), CKD, CVA, HTN, CHF, HLD.    PT Comments    Patient more lethargic and with worse lean to right today. Later spoke to RN and she reported pt did not sleep at all last night. Remained with eyes closed throughout most of session, although responding verbally. Arousal did not improve with AAROM LE exercises. Max assist to come to sit EOB. Able to sit EOB ~8 minutes working on  sitting balance and swallowing her pills for her RN. Returned to supine due to persistent lethargy.    Recommendations for follow up therapy are one component of a multi-disciplinary discharge planning process, led by the attending physician.  Recommendations may be updated based on patient status, additional functional criteria and insurance authorization.  Follow Up Recommendations  Skilled nursing-short term rehab (<3 hours/day) Can patient physically be transported by private vehicle: No   Assistance Recommended at Discharge Frequent or constant Supervision/Assistance  Patient can return home with the following Two people to help with walking and/or transfers;Assistance with cooking/housework;Assistance with feeding;Direct supervision/assist for medications management;Direct supervision/assist for financial management;Assist for transportation;Help with stairs or ramp for entrance   Equipment Recommendations  None recommended by PT    Recommendations for Other Services       Precautions / Restrictions Precautions Precautions: Fall;Other (comment) Precaution Comments: L inattention, LUE weakness Restrictions Weight Bearing Restrictions: No     Mobility  Bed Mobility Overal bed  mobility: Needs Assistance Bed Mobility: Rolling, Sidelying to Sit, Sit to Sidelying Rolling: Mod assist Sidelying to sit: Max assist     Sit to sidelying: Mod assist General bed mobility comments: rolling left max assist, right total assist; rt side to sit with asssit to take legs over EOB and raise torso; pt with strong rt lean in sitting; return to rt sidelying with assist to torso and pt assisting to raise legs up onto bed    Transfers                   General transfer comment: unable due to lethargy and strong rt lean in sitting    Ambulation/Gait                   Stairs             Wheelchair Mobility    Modified Rankin (Stroke Patients Only)       Balance Overall balance assessment: Needs assistance Sitting-balance support: No upper extremity supported, Feet supported Sitting balance-Leahy Scale: Zero Sitting balance - Comments: varied from Min A to max assist with rt lean increased compared to 10/2                                    Cognition Arousal/Alertness: Lethargic Behavior During Therapy: Flat affect Overall Cognitive Status: No family/caregiver present to determine baseline cognitive functioning Area of Impairment: Attention, Memory, Following commands, Safety/judgement, Awareness, Problem solving                   Current Attention Level: Sustained Memory: Decreased short-term memory Following Commands: Follows one step commands inconsistently, Follows one step commands  with increased time Safety/Judgement: Decreased awareness of safety, Decreased awareness of deficits Awareness: Intellectual Problem Solving: Slow processing, Difficulty sequencing, Requires verbal cues, Decreased initiation, Requires tactile cues General Comments: per RN, did not sleep last night and very groggy with eyes mostly closed despite answering questions and following some commands        Exercises General Exercises - Lower  Extremity Ankle Circles/Pumps: PROM, Both, 5 reps Heel Slides: AAROM, Both, 5 reps    General Comments General comments (skin integrity, edema, etc.): on 2L oxygen throughout session. RN in and gave pt meds while sitting EOB. EoB ~8 minutes.      Pertinent Vitals/Pain Pain Assessment Pain Assessment: No/denies pain    Home Living                          Prior Function            PT Goals (current goals can now be found in the care plan section) Acute Rehab PT Goals Patient Stated Goal: unable to state Time For Goal Achievement: 09/28/22 Potential to Achieve Goals: Good Progress towards PT goals: Not progressing toward goals - comment (more lethargic)    Frequency    Min 2X/week      PT Plan Current plan remains appropriate;Frequency needs to be updated    Co-evaluation              AM-PAC PT "6 Clicks" Mobility   Outcome Measure  Help needed turning from your back to your side while in a flat bed without using bedrails?: A Lot Help needed moving from lying on your back to sitting on the side of a flat bed without using bedrails?: A Lot Help needed moving to and from a bed to a chair (including a wheelchair)?: Total Help needed standing up from a chair using your arms (e.g., wheelchair or bedside chair)?: Total Help needed to walk in hospital room?: Total Help needed climbing 3-5 steps with a railing? : Total 6 Click Score: 8    End of Session Equipment Utilized During Treatment: Oxygen Activity Tolerance: Patient limited by lethargy Patient left: in bed;with call bell/phone within reach;with bed alarm set Nurse Communication: Mobility status;Other (comment) (more lethargic than previously) PT Visit Diagnosis: Unsteadiness on feet (R26.81);Other abnormalities of gait and mobility (R26.89);Other symptoms and signs involving the nervous system (D40.814)     Time: 4818-5631 PT Time Calculation (min) (ACUTE ONLY): 30 min  Charges:  $Therapeutic  Activity: 23-37 mins                      [place]  Office [phone]    Rexanne Mano 09/16/2022, 8:52 AM

## 2022-09-16 NOTE — Progress Notes (Signed)
[facility] Progress Note   Subjective:    Seen and examined patient at bedside on HD. She is resting and tolerating UFG 2L. Reviewed recent Hospitalist note: plan to discharge to SNF tomorrow AM.  Objective Vitals:   09/16/22 1345 09/16/22 1400 09/16/22 1415 09/16/22 1430  BP: (!) 169/57 (!) 170/56 (!) 175/63 (!) 171/60  Pulse: 61 60 70 61  Resp: '16 16 18 12  '$ Temp:      TempSrc:      SpO2: 99% 98% 98% 97%  Weight:      Height:       Physical Exam General: Frail woman, NAD. Nasal O2 in place Heart: RRR; 2/6 murmur Lungs: Coarse air movement, no rales Abdomen: soft Extremities: trace BLE edema Dialysis Access:  RUE AVF + thrill, old L AVG not useable  Filed Weights   09/12/22 0455 09/14/22 1815 09/16/22 1315  Weight: 50.2 kg 50.1 kg 50.4 kg    Intake/Output Summary (Last 24 hours) at 09/16/2022 1439 Last data filed at 09/16/2022 0830 Gross per 24 hour  Intake 320 ml  Output --  Net 320 ml    Additional Objective Labs: Basic Metabolic Panel: Recent Labs  Lab 09/12/22 0410 09/12/22 0953 09/13/22 1017 09/14/22 0537 09/15/22 0351 09/16/22 1323  NA 134* 133*   < > 132* 135 131*  K 5.8* 3.9   < > 4.9 4.5 4.6  CL 93* 94*   < > 91* 93* 92*  CO2 20* 25   < > 24 26 19*  GLUCOSE 160* 135*   < > 156* 143* 136*  BUN 81* 28*   < > 66* 41* 70*  CREATININE 7.02* 3.18*   < > 5.93* 3.82* 5.55*  CALCIUM 8.4* 7.7*   < > 8.4* 9.1 9.4  PHOS 8.3* 4.5  --   --   --  5.9*   < > = values in this interval not displayed.   Liver Function Tests: Recent Labs  Lab 09/13/22 1017 09/14/22 0537 09/15/22 0351 09/16/22 1323  AST 278* 182* 121*  --   ALT 190* 138* 113*  --   ALKPHOS 177* 171* 157*  --   BILITOT 0.8 0.9 1.0  --   PROT 6.5 6.2* 6.4*  --   ALBUMIN 3.0* 2.9* 3.0* 2.8*   No results for input(s): "LIPASE", "AMYLASE" in the last 168 hours. CBC: Recent Labs  Lab 09/13/22 1017 09/14/22 0537 09/15/22 0351 09/15/22 1608 09/16/22 1323  WBC 8.5 6.6 6.2  5.9 11.2*  NEUTROABS 7.2 5.6 4.9  --   --   HGB 11.1* 10.6* 10.4* 11.0* 12.1  HCT 33.1* 32.1* 31.2* 33.7* 34.7*  MCV 95.1 97.0 94.5 97.7 93.8  PLT 226 200 172 165 212   Blood Culture    Component Value Date/Time   SDES BLOOD RIGHT ARM 09/10/2022 2205   SPECREQUEST  09/10/2022 2205    BOTTLES DRAWN AEROBIC AND ANAEROBIC Blood Culture results may not be optimal due to an excessive volume of blood received in culture bottles   CULT  09/10/2022 2205    NO GROWTH 5 DAYS Performed at [facility] [address] [place], [place] [address]    REPTSTATUS 09/15/2022 FINAL 09/10/2022 2205    Cardiac Enzymes: No results for input(s): "CKTOTAL", "CKMB", "CKMBINDEX", "TROPONINI" in the last 168 hours. CBG: Recent Labs  Lab 09/15/22 1658 09/15/22 2206 09/16/22 0514 09/16/22 0733 09/16/22 1112  GLUCAP 113* 168* 116* 126* 123*   Iron Studies: No results for  input(s): "IRON", "TIBC", "TRANSFERRIN", "FERRITIN" in the last 72 hours. Lab Results  Component Value Date   INR 1.8 (H) 09/10/2022   INR 2.0 (H) 08/18/2022   INR 1.2 05/06/2022   Studies/Results: No results found.  Medications:   (feeding supplement) PROSource Plus  30 mL Oral BID BM   amoxicillin-clavulanate  1 tablet Oral Daily   Chlorhexidine Gluconate Cloth  6 each Topical Q0600   clopidogrel  75 mg Oral Daily   dexamethasone  6 mg Oral Daily   dextromethorphan-guaiFENesin  1 tablet Oral BID   heparin injection (subcutaneous)  5,000 Units Subcutaneous Q8H   insulin aspart  0-9 Units Subcutaneous TID AC & HS   midodrine  10 mg Oral TID   pantoprazole  40 mg Oral Daily   sevelamer carbonate  800 mg Oral TID WC    Dialysis Orders: Dialyzes at [facility], MWF, 4 hours, 2k 2ca, edw 44.1kg Access; RUE AVG Mircera 30 mcg on 8/11, last Hb 11.3 Hectorol 5 mcg w/each HD Sensipar 30 mg with HD.  Sevelamer 800 mg 2 tabs with each meal.   Assessment/Plan:  Sepsis in the setting of COVID-19 positive: Initially on  Vanc/Cefepime/Flagyl -> now cefepime alone. Blood Cx 9/28 negative. CTA no PE, some tree-in-bud opacities and medistinal LAD. S/p Actemra + steroids. Off pressors. ESRD: Back to usual MWF schedule after being off schedule this week and issues with hyperK. On HD.  Chronic hyperkalemia in ESRD patient: Resolving with HD and lokelma. K+ today 4.6. Hypotension: on midodrine '15mg'$  TID for now, follow. Anemia of ESRD: Hemoglobin now 12.1. Last dose of ESA back in August. No ESA/Fe needed at this time. Metabolic Bone Disease/hyperphosphatemia: CorrCa/Phos ok, continue sevelamer.   Nutrition: Alb low, protein supps added. Dispo: Plan for dc to SNF in AM. Okay for dc from renal standpoint.  Tobie Poet, NP [facility] 09/16/2022,2:39 PM  LOS: 6 days

## 2022-09-16 NOTE — Procedures (Signed)
HD Note:  Some information was entered later than the data was gathered due to patient care needs. The stated time with the data is accurate.   Patient in room in bed during the treatment.  She has her eyes closed and is still.  She will respond when spoken to, but has only said "yes ma'am" to any question asked.  She did say her full name and date of birth when asked that specific question. Patient was too weak to stand to be weighed, so the weights are those while in bed  Informed consent signed and in chart.    Patient tolerated the treatment without any physical issues limiting the completion.  Patient is experiencing a dry cough that she is working to expectorate.  She was only able to expectorate once.  She awoke during those coughing episodes and then went back to resting still with her eyes closed. Patient's SBP was between 152 and 196 during treatment.  The last BP was 196/62  Hand-off given to patient's nurse.   Access used: RUE graft was used Access issues: 2000 The left upper arm fistula has bruit and thrill present and is intact WDL  Total UF removed:     [facility]

## 2022-09-17 DIAGNOSIS — G4700 Insomnia, unspecified: Secondary | ICD-10-CM | POA: Diagnosis not present

## 2022-09-17 DIAGNOSIS — I509 Heart failure, unspecified: Secondary | ICD-10-CM | POA: Diagnosis not present

## 2022-09-17 DIAGNOSIS — T86822 Skin graft (allograft) (autograft) infection: Secondary | ICD-10-CM | POA: Diagnosis not present

## 2022-09-17 DIAGNOSIS — N184 Chronic kidney disease, stage 4 (severe): Secondary | ICD-10-CM | POA: Diagnosis not present

## 2022-09-17 DIAGNOSIS — Z992 Dependence on renal dialysis: Secondary | ICD-10-CM | POA: Diagnosis not present

## 2022-09-17 DIAGNOSIS — N186 End stage renal disease: Secondary | ICD-10-CM | POA: Diagnosis not present

## 2022-09-17 DIAGNOSIS — R5381 Other malaise: Secondary | ICD-10-CM | POA: Diagnosis not present

## 2022-09-17 DIAGNOSIS — R279 Unspecified lack of coordination: Secondary | ICD-10-CM | POA: Diagnosis not present

## 2022-09-17 DIAGNOSIS — R1312 Dysphagia, oropharyngeal phase: Secondary | ICD-10-CM | POA: Diagnosis not present

## 2022-09-17 DIAGNOSIS — J9601 Acute respiratory failure with hypoxia: Secondary | ICD-10-CM | POA: Diagnosis not present

## 2022-09-17 DIAGNOSIS — I10 Essential (primary) hypertension: Secondary | ICD-10-CM | POA: Diagnosis not present

## 2022-09-17 DIAGNOSIS — E440 Moderate protein-calorie malnutrition: Secondary | ICD-10-CM | POA: Diagnosis not present

## 2022-09-17 DIAGNOSIS — N2581 Secondary hyperparathyroidism of renal origin: Secondary | ICD-10-CM | POA: Diagnosis not present

## 2022-09-17 DIAGNOSIS — R4182 Altered mental status, unspecified: Secondary | ICD-10-CM | POA: Diagnosis not present

## 2022-09-17 DIAGNOSIS — I2510 Atherosclerotic heart disease of native coronary artery without angina pectoris: Secondary | ICD-10-CM | POA: Diagnosis not present

## 2022-09-17 DIAGNOSIS — K219 Gastro-esophageal reflux disease without esophagitis: Secondary | ICD-10-CM | POA: Diagnosis not present

## 2022-09-17 DIAGNOSIS — J9600 Acute respiratory failure, unspecified whether with hypoxia or hypercapnia: Secondary | ICD-10-CM | POA: Diagnosis not present

## 2022-09-17 DIAGNOSIS — E785 Hyperlipidemia, unspecified: Secondary | ICD-10-CM | POA: Diagnosis not present

## 2022-09-17 DIAGNOSIS — R41841 Cognitive communication deficit: Secondary | ICD-10-CM | POA: Diagnosis not present

## 2022-09-17 DIAGNOSIS — F32A Depression, unspecified: Secondary | ICD-10-CM | POA: Diagnosis not present

## 2022-09-17 DIAGNOSIS — L299 Pruritus, unspecified: Secondary | ICD-10-CM | POA: Diagnosis not present

## 2022-09-17 DIAGNOSIS — M6281 Muscle weakness (generalized): Secondary | ICD-10-CM | POA: Diagnosis not present

## 2022-09-17 DIAGNOSIS — I959 Hypotension, unspecified: Secondary | ICD-10-CM | POA: Diagnosis not present

## 2022-09-17 DIAGNOSIS — R2689 Other abnormalities of gait and mobility: Secondary | ICD-10-CM | POA: Diagnosis not present

## 2022-09-17 DIAGNOSIS — A419 Sepsis, unspecified organism: Secondary | ICD-10-CM | POA: Diagnosis not present

## 2022-09-17 DIAGNOSIS — R262 Difficulty in walking, not elsewhere classified: Secondary | ICD-10-CM | POA: Diagnosis not present

## 2022-09-17 DIAGNOSIS — U071 COVID-19: Secondary | ICD-10-CM | POA: Diagnosis not present

## 2022-09-17 DIAGNOSIS — Z7401 Bed confinement status: Secondary | ICD-10-CM | POA: Diagnosis not present

## 2022-09-17 LAB — GLUCOSE, CAPILLARY
Glucose-Capillary: 111 mg/dL — ABNORMAL HIGH (ref 70–99)
Glucose-Capillary: 113 mg/dL — ABNORMAL HIGH (ref 70–99)
Glucose-Capillary: 176 mg/dL — ABNORMAL HIGH (ref 70–99)

## 2022-09-17 IMAGING — DX DG CHEST 1V PORT
1 series · 1 of 1 positions shown · non-contrast
Comparison: Chest x-ray 02/02/2022.

CLINICAL DATA: 70-year-old female with history of shortness of
breath.

EXAM:
PORTABLE CHEST 1 VIEW

[chest ap]
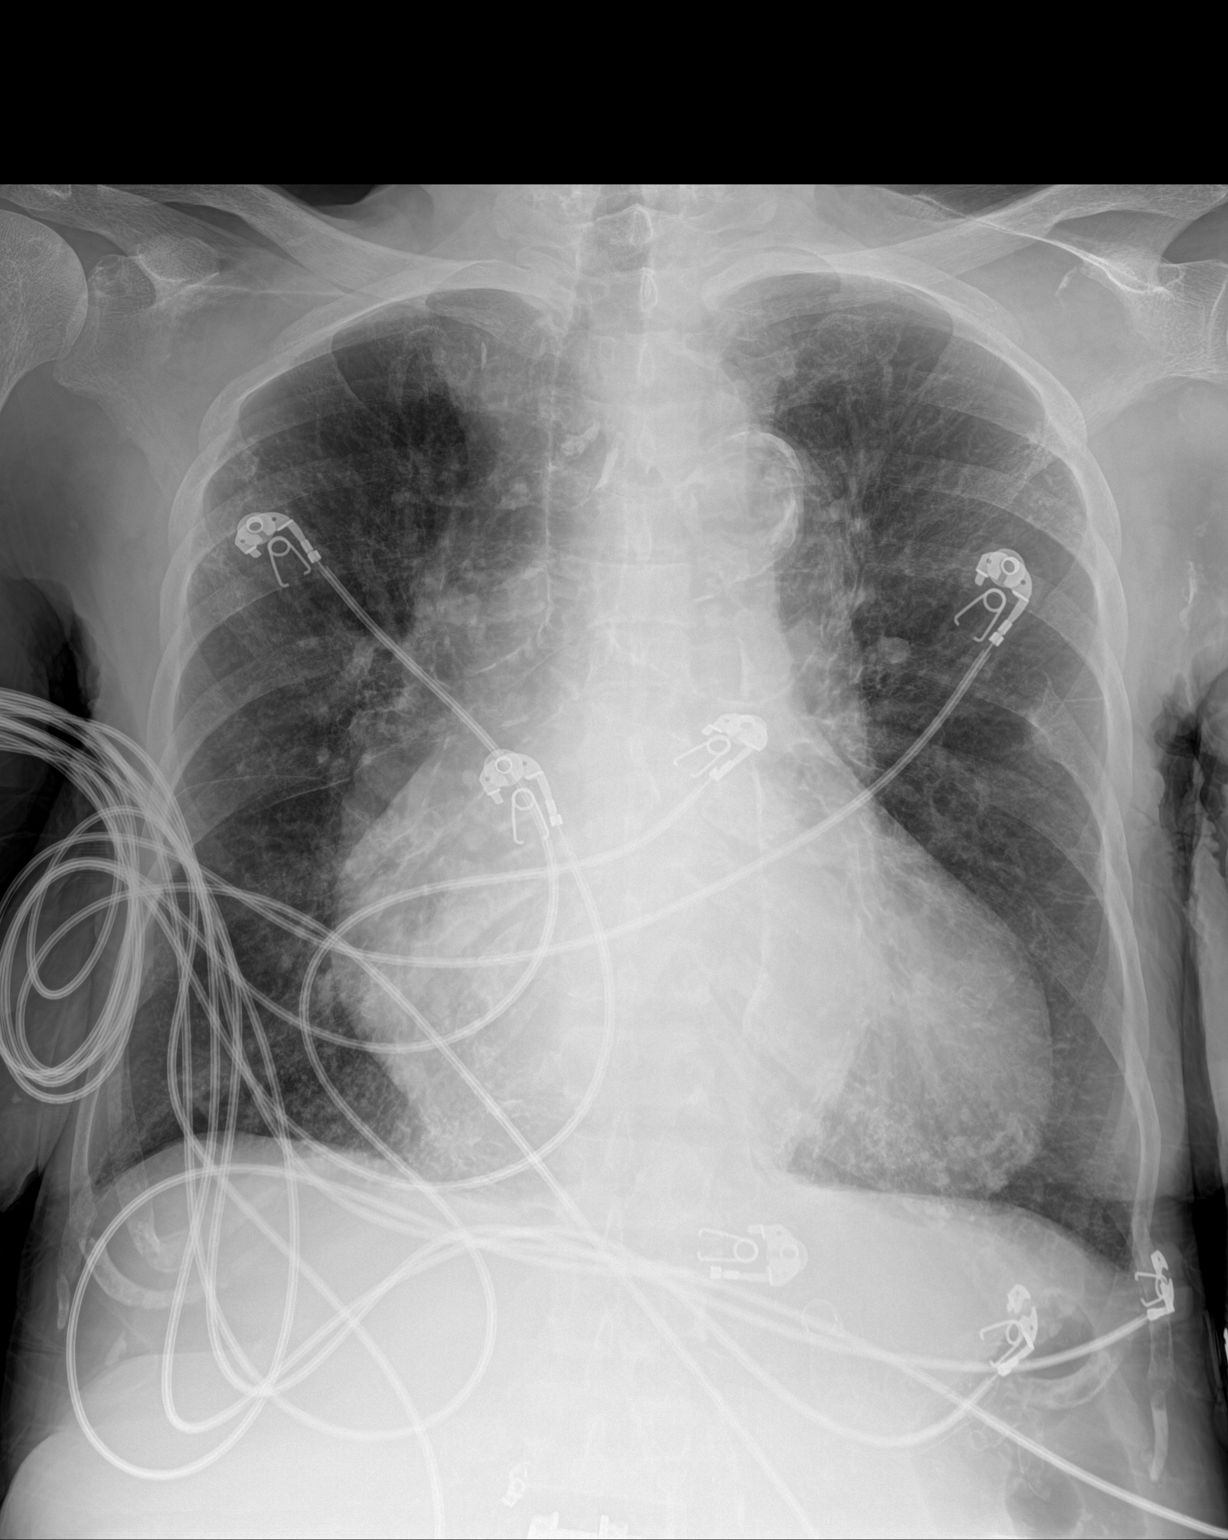

[1 of 1 positions shown; findings below may reference images not displayed]

FINDINGS: Lung volumes are normal. No consolidative airspace disease. No
pleural effusions. No pneumothorax. No evidence of pulmonary edema.
Heart size is moderately enlarged. Upper mediastinal contours are
within normal limits. Atherosclerotic calcifications are noted in
the thoracic aorta. Orthopedic fixation hardware in the cervical
spine incidentally noted.
IMPRESSION: 1. No radiographic evidence of acute cardiopulmonary disease.
2. Moderate cardiomegaly, similar to the prior examination.
3. Aortic atherosclerosis.

## 2022-09-17 MED ORDER — HYDROCORTISONE (PERIANAL) 2.5 % EX CREA: 1.0000 | TOPICAL_CREAM | Freq: Four times a day (QID) | CUTANEOUS | 0 refills | Status: DC | PRN

## 2022-09-17 MED ORDER — DEXAMETHASONE 6 MG PO TABS: 6.0000 mg | ORAL_TABLET | Freq: Every day | ORAL | 0 refills | Status: AC

## 2022-09-17 NOTE — Progress Notes (Signed)
Pt to d/c to snf today. Contacted [facility] to advise clinic of pt's d/c today and that pt will resume care tomorrow. Pt will need to arrive at 11:40 for 12:00 chair time. Transportation/snf will need to call the clinic when pt arrives for treatment and pt will need to wait in the vehicle until clinic staff come to get pt when it is time to start pt's treatment. CSW  made aware of this info earlier this week to provide to snf at d/c.   Melven Sartorius Renal Navigator [phone]

## 2022-09-17 NOTE — Discharge Summary (Signed)
Physician Discharge Summary   Patient: [name] MRN: [id number] DOB: July 27, 1951  Admit date:     09/10/2022  Discharge date: 09/17/22  Discharge Physician: Estill Cotta, MD    PCP: [name], MD   Recommendations at discharge:   Continue Decadron for 2 more days to complete on 10/7 Completed antibiotic course Continue midodrine 10 mg 3 times daily Antihypertensives including Coreg, metoprolol, losartan held Recommend palliative to follow at SNF  Discharge Diagnoses:    Septic shock vs. hypovolemic shock in setting of covid 19  Acute respiratory failure with hypoxia secondary to COVID-19   Hypotension   Anemia of chronic renal failure   Acute respiratory failure with hypoxia (HCC)   ESRD on dialysis ([facility]) on hemodialysis MWF   Prolonged QT interval   Transaminitis   H/O: CVA (cerebrovascular accident)   Hyperlipidemia  NSTEMI on admission    Hospital Course:  Patient is a 71 year old female with ESRD on HD MWF, MGUS, renal transplant in 1996, CAD, prior CVA with left-sided weakness, recurrent CVA, 02/21/2021, paroxysmal A-fib, DCCV 2021 presented with shortness of breath, abdominal pain, nausea and vomiting on 5/28.  Patient was found to be COVID-positive.  Initially started on vancomycin, cefepime.  Also placed on tocilizumab and Decadron.  She was on vasopressors transiently 9/29 to 9/30 and was transferred to [facility] on 10/1.   Assessment and Plan: Septic shock vs. hypovolemic shock in setting of covid 19, POA -Antihypertensives held, patient was briefly placed on vasopressors, now off -Remains on midodrine -Initially placed on vancomycin, cefepime then narrowed to oral Augmentin.  Completed course, does not need any further antibiotics. -CT chest showed no PE -Shock physiology has been improving.  Continue midodrine 10 mg 3 times daily.  Continue to hold oral antihypertensives. -Patient received 1 dose of tocilizumab as well as baricitinib,  Decadron -Continue Decadron for total of 10 days, ending 10/7 -Continue symptomatic treatment   Acute respiratory failure with hypoxia secondary to COVID-19 -CTA chest showed stable micronodules and tree-in-bud opacities in the right upper lobe and right lower lobe likely infectious/inflammatory -Continue Decadron, for total 10 days, ending 10/7 -Completed antibiotic course     Hypotension -Continue to hold Coreg, losartan, placed on midodrine 10 mg 3 times daily     ESRD, history of renal transplant, AV graft failure and infection, hyperkalemia -On HD, MWF -Nephrology was consulted, underwent hemodialysis per her schedule   Recurrent CVAs -Continue Plavix , Atorvastatin     Anemia of chronic renal failure -H&H currently stable, 12.1 at discharge   NSTEMI on admission -Likely not acute ACS, EKG showed no ST-T wave changes. -Troponin elevation likely due to underlying ESRD   Elevated LFTs -Likely due to #1, improving        Pain control - [place] Controlled Substance Reporting System database was reviewed. and patient was instructed, not to drive, operate heavy machinery, perform activities at heights, swimming or participation in water activities or provide baby-sitting services while on Pain, Sleep and Anxiety Medications; until their outpatient Physician has advised to do so again. Also recommended to not to take more than prescribed Pain, Sleep and Anxiety Medications.  Consultants: PCCM, nephrology, palliative medicine Procedures performed: Hemodialysis Disposition: Skilled nursing facility Diet recommendation:  Discharge Diet Orders (From admission, onward)     Start     Ordered   09/17/22 0000  Diet Carb Modified        09/17/22 1025   09/15/22 0000  Diet - low sodium heart  healthy        09/15/22 0910           Carb modified diet DISCHARGE MEDICATION: Allergies as of 09/17/2022       Reactions   Sulfa Antibiotics Other (See Comments)   Both pt's  parents allergic to this type of medication   Adhesive [tape] Itching        Medication List     STOP taking these medications    acetaminophen 500 MG tablet Commonly known as: TYLENOL   carvedilol 6.25 MG tablet Commonly known as: COREG   cephALEXin 500 MG capsule Commonly known as: KEFLEX   doxycycline 100 MG tablet Commonly known as: VIBRA-TABS   loperamide 2 MG capsule Commonly known as: IMODIUM   losartan 50 MG tablet Commonly known as: COZAAR   metoprolol tartrate 25 MG tablet Commonly known as: LOPRESSOR   ondansetron 8 MG tablet Commonly known as: ZOFRAN   zolpidem 5 MG tablet Commonly known as: AMBIEN       TAKE these medications    albuterol 108 (90 Base) MCG/ACT inhaler Commonly known as: VENTOLIN HFA Inhale 1-2 puffs into the lungs every 6 (six) hours as needed for wheezing or shortness of breath. What changed:  how much to take reasons to take this   albuterol (2.5 MG/3ML) 0.083% nebulizer solution Commonly known as: PROVENTIL Take 3 mLs (2.5 mg total) by nebulization every 4 (four) hours as needed for wheezing. What changed: when to take this   atorvastatin 80 MG tablet Commonly known as: LIPITOR Take 80 mg by mouth at bedtime.   benzonatate 200 MG capsule Commonly known as: TESSALON Take 1 capsule (200 mg total) by mouth 2 (two) times daily as needed for cough.   clopidogrel 75 MG tablet Commonly known as: Plavix Take 1 tablet (75 mg total) by mouth daily. What changed: when to take this   dexamethasone 6 MG tablet Commonly known as: DECADRON Take 1 tablet (6 mg total) by mouth daily for 2 days. Until 10/7   dextromethorphan-guaiFENesin 30-600 MG 12hr tablet Commonly known as: MUCINEX DM Take 1 tablet by mouth 2 (two) times daily.   Dialyvite 800 0.8 MG Wafr Take 1 tablet by mouth every morning.   escitalopram 10 MG tablet Commonly known as: LEXAPRO Take 1 tablet (10 mg total) by mouth every morning.   hydrocortisone  2.5 % rectal cream Commonly known as: ANUSOL-HC Apply 1 Application topically 4 (four) times daily as needed for hemorrhoids.   hydrOXYzine 10 MG tablet Commonly known as: ATARAX Take 1 tablet (10 mg total) by mouth 3 (three) times daily as needed for itching. What changed:  medication strength how much to take when to take this   midodrine 10 MG tablet Commonly known as: PROAMATINE Take 1 tablet (10 mg total) by mouth 3 (three) times daily with meals.   pantoprazole 40 MG tablet Commonly known as: PROTONIX Take 1 tablet (40 mg total) by mouth daily.   sevelamer carbonate 800 MG tablet Commonly known as: RENVELA Take 1 tablet (800 mg total) by mouth 3 (three) times daily with meals.               Discharge Care Instructions  (From admission, onward)           Start     Ordered   09/17/22 0000  If the dressing is still on your incision site when you go home, remove it on the third day after your surgery date. Remove dressing  if it begins to fall off, or if it is dirty or damaged before the third day.        09/17/22 1025            Follow-up Information     Polite, Jori Moll, MD. Schedule an appointment as soon as possible for a visit in 2 week(s).   Specialty: Internal Medicine Why: for hospital follow-up Contact information: [address] [place] [address] [phone]         Burnell Blanks, MD .   Specialty: Cardiology Contact information: [address] [place] [address] [phone]                Discharge Exam: Filed Weights   09/12/22 0455 09/14/22 1815 09/16/22 1315  Weight: 50.2 kg 50.1 kg 50.4 kg   S: No acute complaints, resting comfortably no fevers or chills or coughing.  Vitals:   09/16/22 1805 09/16/22 2033 09/17/22 0621 09/17/22 0854  BP: (!) 155/61 107/77 (!) 90/51 (!) 91/51  Pulse: 64  64 73  Resp: 18   17  Temp: 99.1 F (37.3 C) 98.8 F (37.1 C) 97.7 F (36.5 C)  97.7 F (36.5 C)  TempSrc:  Oral Oral Axillary  SpO2: 100% 100% 100% 99%  Weight:      Height:        Physical Exam General: Alert and awake, NAD, comfortable Cardiovascular: S1 S2 clear, RRR.  Respiratory: Diminished  breath sounds at the bases Gastrointestinal: Soft, nontender, nondistended, NBS Ext: no pedal edema bilaterally Psych: Normal affect and demeanor    Condition at discharge: fair  The results of significant diagnostics from this hospitalization (including imaging, microbiology, ancillary and laboratory) are listed below for reference.   Imaging Studies: CT Angio Chest Pulmonary Embolism (PE) W or WO Contrast  Result Date: 09/10/2022 CLINICAL DATA:  High probability for PE. EXAM: CT ANGIOGRAPHY CHEST WITH CONTRAST TECHNIQUE: Multidetector CT imaging of the chest was performed using the standard protocol during bolus administration of intravenous contrast. Multiplanar CT image reconstructions and MIPs were obtained to evaluate the vascular anatomy. RADIATION DOSE REDUCTION: This exam was performed according to the departmental dose-optimization program which includes automated exposure control, adjustment of the mA and/or kV according to patient size and/or use of iterative reconstruction technique. CONTRAST:  91m OMNIPAQUE IOHEXOL 350 MG/ML SOLN COMPARISON:  Chest x-ray same day.  CT chest 04/26/2022. FINDINGS: Cardiovascular: The aorta is diffusely ectatic without focal dilatation. The heart is enlarged. There is no pericardial effusion. There are atherosclerotic calcifications of the aorta. There is adequate opacification of the pulmonary arteries to the segmental level. There is no evidence for pulmonary embolism. Mediastinum/Nodes: There is an enlarged AP window lymph node measuring 15 mm short axis which is new from prior. There is an enlarged paratracheal lymph node measuring 14 mm, increased in size compared to prior study. Esophagus is nondilated. Visualized thyroid  gland is within normal limits. Lungs/Pleura: There is minimal dependent atelectasis in the lower lobes bilaterally. Chronic appearing micro nodules and tree-in-bud opacities are stable in the right upper lobe and right lower lobe no new focal lung infiltrate, pleural effusion or pneumothorax. Upper Abdomen: Severe atherosclerotic calcifications are present. Musculoskeletal: Changes of renal osteodystrophy again seen. No acute fracture. Review of the MIP images confirms the above findings. IMPRESSION: 1. No evidence for pulmonary embolism. 2. New mediastinal lymphadenopathy of uncertain etiology. 3. Cardiomegaly. 4. Stable micro nodules and tree-in-bud opacities in the right upper lobe  and right lower lobe, likely infectious/inflammatory. Aortic Atherosclerosis (ICD10-I70.0). Electronically Signed   By: Ronney Asters M.D.   On: 09/10/2022 20:58   DG Chest Portable 1 View  Result Date: 09/10/2022 CLINICAL DATA:  Weakness/hypotension, onset after dialysis yesterday. Two episodes of emesis yesterday. EXAM: PORTABLE CHEST 1 VIEW COMPARISON:  Chest x-rays dated 09/07/2022, 08/19/2022 and 02/02/2022. FINDINGS: Stable cardiomegaly. Lungs are clear. No pleural effusion or pneumothorax is seen. Visualized osseous structures about the chest are unremarkable. IMPRESSION: No active disease. No evidence of pneumonia or pulmonary edema. Stable cardiomegaly. Electronically Signed   By: Franki Cabot M.D.   On: 09/10/2022 13:36   CT ABDOMEN PELVIS WO CONTRAST  Result Date: 09/07/2022 CLINICAL DATA:  71 year old female with acute abdominal pain. EXAM: CT ABDOMEN AND PELVIS WITHOUT CONTRAST TECHNIQUE: Multidetector CT imaging of the abdomen and pelvis was performed following the standard protocol without IV contrast. RADIATION DOSE REDUCTION: This exam was performed according to the departmental dose-optimization program which includes automated exposure control, adjustment of the mA and/or kV according to patient size and/or  use of iterative reconstruction technique. COMPARISON:  CT Abdomen and Pelvis 01/23/2022 and earlier. FINDINGS: Lower chest: Chronic cardiomegaly. Tortuous and calcified descending thoracic aorta. No pericardial effusion. Trace layering pleural effusions are new since February. Superimposed chronic lower lobe bronchiectasis which is more apparent on the right. Chronic postinflammatory appearing alveolar calcifications at both lung bases are stable. Hepatobiliary: Trace perihepatic ascites is new since February. Chronically absent gallbladder. Stable liver with hepatic arterial calcifications. Pancreas: Stable, negative. Spleen: Stable, no splenomegaly. Adrenals/Urinary Tract: Chronic native renal atrophy. A left renal lesion cyst with rim calcification has substantially regressed since February and is now only 3.3 cm diameter (previously 6.3 cm). No pararenal inflammation. Both adrenal glands are difficult to identified. Decompressed urinary bladder. Stomach/Bowel: No dilated large or small bowel. Chronic sigmoid diverticulosis. No convincing active inflammation on this noncontrast exam. Decompressed stomach. No free air or free fluid. A globular chronic dystrophic calcification of the ventral peritoneal cavity at the anterior pelvis is unchanged since at least 2005. No free air. Vascular/Lymphatic: Severe diffuse calcified atherosclerosis. Aortoiliac calcified atherosclerosis. Negative for abdominal aortic aneurysm. Vascular patency is not evaluated in the absence of IV contrast. No lymphadenopathy identified. Reproductive: Negative noncontrast appearance. Other: No pelvic free fluid. Musculoskeletal: Chronic endplate erosions at N8-G9 in the setting of chronic spondylolisthesis and posterior interspinous spacer device have not significantly changed since 2021. Underlying abnormal bone mineralization, probably chronic renal osteodystrophy. No acute osseous abnormality identified. IMPRESSION: 1. Chronic  cardiomegaly, native renal atrophy, severe atherosclerosis, and renal osteodystrophy. 2. Trace layering pleural effusions and perihepatic ascites are new since February. 3. But no other acute or inflammatory process is identified in the noncontrast abdomen or pelvis. Electronically Signed   By: Genevie Ann M.D.   On: 09/07/2022 08:50   DG Chest Portable 1 View  Result Date: 09/07/2022 CLINICAL DATA:  71 year old female with shortness of breath and abdominal pain. EXAM: PORTABLE CHEST 1 VIEW COMPARISON:  Chest radiographs 08/18/2022 and earlier. FINDINGS: Portable AP semi upright view at 0410 hours. Moderate to severe cardiomegaly does not appear significantly changed. Tortuous and calcified thoracic aorta contour is stable. Other mediastinal contours are within normal limits. Stable lung volumes and ventilation. No pneumothorax, pulmonary edema, pleural effusion, or consolidation. Chronic subpleural scarring in the upper lobes appears stable from CT chest 04/26/2022. Paucity of bowel gas in the visible upper abdomen. IMPRESSION: Stable pronounced Cardiomegaly and Aortic Atherosclerosis (ICD10-I70.0). No acute cardiopulmonary abnormality.  Electronically Signed   By: Genevie Ann M.D.   On: 09/07/2022 04:44   DG Abdomen 1 View  Result Date: 08/18/2022 CLINICAL DATA:  Abdominal distention EXAM: ABDOMEN - 1 VIEW COMPARISON:  05/18/2022 FINDINGS: Gas-filled, nonobstructed loops of bowel throughout the abdomen and pelvis. No obvious free air on supine radiograph. No large burden of stool. IMPRESSION: Gas-filled, nonobstructed loops of bowel throughout the abdomen and pelvis. No obvious free air on supine radiograph. No large burden of stool. Electronically Signed   By: Delanna Ahmadi M.D.   On: 08/18/2022 10:40    Microbiology: Results for orders placed or performed during the hospital encounter of 09/10/22  Blood Culture (routine x 2)     Status: None   Collection Time: 09/10/22  1:14 PM   Specimen: BLOOD RIGHT ARM   Result Value Ref Range Status   Specimen Description BLOOD RIGHT ARM  Final   Special Requests   Final    BOTTLES DRAWN AEROBIC ONLY Blood Culture results may not be optimal due to an inadequate volume of blood received in culture bottles   Culture   Final    NO GROWTH 5 DAYS Performed at [facility], [place] [address], [place], [place] [address]    Report Status 09/15/2022 FINAL  Final  SARS Coronavirus 2 by RT PCR (hospital order, performed in [facility] hospital lab) *cepheid single result test* Anterior Nasal Swab     Status: Abnormal   Collection Time: 09/10/22  1:17 PM   Specimen: Anterior Nasal Swab  Result Value Ref Range Status   SARS Coronavirus 2 by RT PCR POSITIVE (A) NEGATIVE Final    Comment: (NOTE) SARS-CoV-2 target nucleic acids are DETECTED  SARS-CoV-2 RNA is generally detectable in upper respiratory specimens  during the acute phase of infection.  Positive results are indicative  of the presence of the identified virus, but do not rule out bacterial infection or co-infection with other pathogens not detected by the test.  Clinical correlation with patient history and  other diagnostic information is necessary to determine patient infection status.  The expected result is negative.  Fact Sheet for Patients:   https://www.patel.info/   Fact Sheet for Healthcare Providers:   https://hall.com/    This test is not yet approved or cleared by the [place] FDA and  has been authorized for detection and/or diagnosis of SARS-CoV-2 by FDA under an Emergency Use Authorization (EUA).  This EUA will remain in effect (meaning this test can be used) for the duration of  the COVID-19 declaration under Section 564(b)(1)  of the Act, 21 U.S.C. section 360-bbb-3(b)(1), unless the authorization is terminated or revoked sooner.   Performed at [facility], [place] [address], [place], [place] [address]   MRSA Next Gen by  PCR, Nasal     Status: None   Collection Time: 09/10/22  8:57 PM   Specimen: Nasal Mucosa; Nasal Swab  Result Value Ref Range Status   MRSA by PCR Next Gen NOT DETECTED NOT DETECTED Final    Comment: (NOTE) The GeneXpert MRSA Assay (FDA approved for NASAL specimens only), is one component of a comprehensive MRSA colonization surveillance program. It is not intended to diagnose MRSA infection nor to guide or monitor treatment for MRSA infections. Test performance is not FDA approved in patients less than 56 years old. Performed at [facility], [place] [address], [place], [place] [address]   Blood Culture (routine x 2)     Status:  None   Collection Time: 09/10/22 10:05 PM   Specimen: BLOOD RIGHT ARM  Result Value Ref Range Status   Specimen Description BLOOD RIGHT ARM  Final   Special Requests   Final    BOTTLES DRAWN AEROBIC AND ANAEROBIC Blood Culture results may not be optimal due to an excessive volume of blood received in culture bottles   Culture   Final    NO GROWTH 5 DAYS Performed at [facility], [place] [address], [place], [place] [address]    Report Status 09/15/2022 FINAL  Final    Labs: CBC: Recent Labs  Lab 09/11/22 0401 09/12/22 0410 09/13/22 1017 09/14/22 0537 09/15/22 0351 09/15/22 1608 09/16/22 1323  WBC 6.6 8.9 8.5 6.6 6.2 5.9 11.2*  NEUTROABS 5.4 7.4 7.2 5.6 4.9  --   --   HGB 11.1* 11.1* 11.1* 10.6* 10.4* 11.0* 12.1  HCT 33.4* 32.2* 33.1* 32.1* 31.2* 33.7* 34.7*  MCV 96.5 93.3 95.1 97.0 94.5 97.7 93.8  PLT 231 231 226 200 172 165 297   Basic Metabolic Panel: Recent Labs  Lab 09/10/22 1900 09/10/22 1918 09/12/22 0410 09/12/22 0953 09/13/22 1017 09/14/22 0537 09/15/22 0351 09/16/22 1323  NA  --    < > 134* 133* 136 132* 135 131*  K  --    < > 5.8* 3.9 4.5 4.9 4.5 4.6  CL  --    < > 93* 94* 93* 91* 93* 92*  CO2  --    < > 20* '25 24 24 26 '$ 19*  GLUCOSE  --    < > 160* 135* 166* 156* 143* 136*  BUN  --    < > 81* 28* 51* 66* 41* 70*   CREATININE  --    < > 7.02* 3.18* 4.77* 5.93* 3.82* 5.55*  CALCIUM  --    < > 8.4* 7.7* 8.3* 8.4* 9.1 9.4  MG 2.0  --   --   --  1.9 2.1 2.0  --   PHOS  --   --  8.3* 4.5  --   --   --  5.9*   < > = values in this interval not displayed.   Liver Function Tests: Recent Labs  Lab 09/11/22 0401 09/12/22 0410 09/12/22 0953 09/13/22 1017 09/14/22 0537 09/15/22 0351 09/16/22 1323  AST 236* 453*  --  278* 182* 121*  --   ALT 109* 219*  --  190* 138* 113*  --   ALKPHOS 221* 195*  --  177* 171* 157*  --   BILITOT 1.2 1.0  --  0.8 0.9 1.0  --   PROT 6.5 6.3*  --  6.5 6.2* 6.4*  --   ALBUMIN 3.2* 2.9* 2.9* 3.0* 2.9* 3.0* 2.8*   CBG: Recent Labs  Lab 09/16/22 1112 09/16/22 1634 09/16/22 2057 09/17/22 0631 09/17/22 0726  GLUCAP 123* 100* 101* 111* 113*    Discharge time spent: greater than 30 minutes.  Signed: Estill Cotta, MD [facility] 09/17/2022

## 2022-09-17 NOTE — TOC Transition Note (Signed)
Transition of Care [facility]) - CM/SW Discharge Note   Patient Details  Name: [name] MRN: [id number] Date of Birth: [birth year]  [facility] CM/SW Contact:  Milinda Antis, [place] Phone Number: 09/17/2022, 11:15 AM   Clinical Narrative:    Patient will DC to:  [name] Anticipated DC date:  09/17/2022 Family notified: Yes Transport by: Corey Harold   Per MD patient ready for DC to SNF. RN to call report prior to discharge [phone] room 110). RN, patient, patient's family, and facility notified of DC. Discharge Summary sent to facility. DC packet on chart. Ambulance transport will be requested for patient.   CSW will sign off for now as social work intervention is no longer needed. Please consult [place] again if new needs arise.     Final next level of care: [place] Barriers to Discharge: Barriers Resolved   Patient Goals and CMS Choice        Discharge Placement              Patient chooses bed at:  [facility]) Patient to be transferred to facility by: [place] Name of family member notified: Ardis Hughs     [phone] Patient and family notified of of transfer: 09/17/22  Discharge Plan and Services                                     Social Determinants of Health (SDOH) Interventions     Readmission Risk Interventions    11/11/2021    3:49 PM 08/18/2021    3:01 PM  Readmission Risk Prevention Plan  Transportation Screening Complete Complete  Medication Review ([place]) Complete Complete  PCP or Specialist appointment within 3-5 days of discharge Complete Complete  HRI or Home Care Consult Complete Complete  SW Recovery Care/Counseling Consult Complete Complete  Palliative Care Screening Not Applicable Not Applicable  Skilled Nursing Facility Complete Complete

## 2022-09-18 DIAGNOSIS — Z992 Dependence on renal dialysis: Secondary | ICD-10-CM | POA: Diagnosis not present

## 2022-09-18 DIAGNOSIS — J9600 Acute respiratory failure, unspecified whether with hypoxia or hypercapnia: Secondary | ICD-10-CM | POA: Diagnosis not present

## 2022-09-18 DIAGNOSIS — N186 End stage renal disease: Secondary | ICD-10-CM | POA: Diagnosis not present

## 2022-09-18 DIAGNOSIS — I959 Hypotension, unspecified: Secondary | ICD-10-CM | POA: Diagnosis not present

## 2022-09-18 DIAGNOSIS — N2581 Secondary hyperparathyroidism of renal origin: Secondary | ICD-10-CM | POA: Diagnosis not present

## 2022-09-18 DIAGNOSIS — L299 Pruritus, unspecified: Secondary | ICD-10-CM | POA: Diagnosis not present

## 2022-09-18 DIAGNOSIS — I509 Heart failure, unspecified: Secondary | ICD-10-CM | POA: Diagnosis not present

## 2022-09-21 DIAGNOSIS — Z992 Dependence on renal dialysis: Secondary | ICD-10-CM | POA: Diagnosis not present

## 2022-09-21 DIAGNOSIS — J9600 Acute respiratory failure, unspecified whether with hypoxia or hypercapnia: Secondary | ICD-10-CM | POA: Diagnosis not present

## 2022-09-21 DIAGNOSIS — N2581 Secondary hyperparathyroidism of renal origin: Secondary | ICD-10-CM | POA: Diagnosis not present

## 2022-09-21 DIAGNOSIS — L299 Pruritus, unspecified: Secondary | ICD-10-CM | POA: Diagnosis not present

## 2022-09-21 DIAGNOSIS — R5381 Other malaise: Secondary | ICD-10-CM | POA: Diagnosis not present

## 2022-09-21 DIAGNOSIS — N186 End stage renal disease: Secondary | ICD-10-CM | POA: Diagnosis not present

## 2022-09-23 DIAGNOSIS — L299 Pruritus, unspecified: Secondary | ICD-10-CM | POA: Diagnosis not present

## 2022-09-23 DIAGNOSIS — N186 End stage renal disease: Secondary | ICD-10-CM | POA: Diagnosis not present

## 2022-09-23 DIAGNOSIS — N2581 Secondary hyperparathyroidism of renal origin: Secondary | ICD-10-CM | POA: Diagnosis not present

## 2022-09-23 DIAGNOSIS — Z992 Dependence on renal dialysis: Secondary | ICD-10-CM | POA: Diagnosis not present

## 2022-09-25 DIAGNOSIS — N2581 Secondary hyperparathyroidism of renal origin: Secondary | ICD-10-CM | POA: Diagnosis not present

## 2022-09-25 DIAGNOSIS — N186 End stage renal disease: Secondary | ICD-10-CM | POA: Diagnosis not present

## 2022-09-25 DIAGNOSIS — L299 Pruritus, unspecified: Secondary | ICD-10-CM | POA: Diagnosis not present

## 2022-09-25 DIAGNOSIS — Z992 Dependence on renal dialysis: Secondary | ICD-10-CM | POA: Diagnosis not present

## 2022-09-28 DIAGNOSIS — N2581 Secondary hyperparathyroidism of renal origin: Secondary | ICD-10-CM | POA: Diagnosis not present

## 2022-09-28 DIAGNOSIS — N186 End stage renal disease: Secondary | ICD-10-CM | POA: Diagnosis not present

## 2022-09-28 DIAGNOSIS — Z992 Dependence on renal dialysis: Secondary | ICD-10-CM | POA: Diagnosis not present

## 2022-09-28 DIAGNOSIS — L299 Pruritus, unspecified: Secondary | ICD-10-CM | POA: Diagnosis not present

## 2022-09-30 DIAGNOSIS — N2581 Secondary hyperparathyroidism of renal origin: Secondary | ICD-10-CM | POA: Diagnosis not present

## 2022-09-30 DIAGNOSIS — L299 Pruritus, unspecified: Secondary | ICD-10-CM | POA: Diagnosis not present

## 2022-09-30 DIAGNOSIS — N186 End stage renal disease: Secondary | ICD-10-CM | POA: Diagnosis not present

## 2022-09-30 DIAGNOSIS — Z992 Dependence on renal dialysis: Secondary | ICD-10-CM | POA: Diagnosis not present

## 2022-10-01 ENCOUNTER — Ambulatory Visit (INDEPENDENT_AMBULATORY_CARE_PROVIDER_SITE_OTHER): Payer: Medicare Other | Admitting: Internal Medicine

## 2022-10-01 ENCOUNTER — Other Ambulatory Visit: Payer: Self-pay

## 2022-10-01 DIAGNOSIS — T827XXS Infection and inflammatory reaction due to other cardiac and vascular devices, implants and grafts, sequela: Secondary | ICD-10-CM

## 2022-10-01 DIAGNOSIS — T86822 Skin graft (allograft) (autograft) infection: Secondary | ICD-10-CM | POA: Diagnosis not present

## 2022-10-01 MED ORDER — DOXYCYCLINE HYCLATE 100 MG PO TABS: 100.0000 mg | ORAL_TABLET | Freq: Two times a day (BID) | ORAL | 5 refills | Status: DC

## 2022-10-01 NOTE — Progress Notes (Signed)
Patient Active Problem List   Diagnosis Date Noted   Transaminitis 09/10/2022   Dermatitis 05/10/2022   Physical deconditioning 05/07/2022   Hypervolemia associated with renal insufficiency 05/06/2022   DNR (do not resuscitate) 05/06/2022   Septic shock vs. hypovolemic shock in setting of covid 19     MSSA bacteremia    Vascular graft infection  and MSSA bacteremia 04/26/2022   Arteriovenous fistula, acquired ([place]) 29/52/8413   Cyclical vomiting syndrome 02/19/2022   Dysphagia 02/19/2022   Anemia of chronic renal failure 02/19/2022   Hypertensive heart and chronic kidney disease with heart failure and with stage 5 chronic kidney disease, or end stage renal disease ([place]) 02/19/2022   Mild cognitive impairment 02/19/2022   Major depression, single episode 02/19/2022   Fall at home, initial encounter 01/23/2022   Acute on chronic diastolic CHF 24/40/1027   GERD (gastroesophageal reflux disease)    Other disorders of calcium metabolism 10/17/2021   Protein-calorie malnutrition, severe 08/12/2021   Acute respiratory failure with hypoxia ([place]) 08/06/2021   Major depressive disorder, recurrent episode, moderate ([place]) 02/23/2021   Prolonged QT interval 02/21/2021   Allergy, unspecified, initial encounter 08/08/2020   NSTEMI 05/14/2020   Hypotension 03/16/2020   Ventricular tachycardia ([place]) 03/15/2020   Seizure ([place]) 25/36/6440   Complication of vascular access for dialysis 12/21/2019   Breakdown (mechanical) of surgically created arteriovenous fistula, initial encounter ([place]) 12/18/2019   Weakness 10/11/2019   Aneurysm artery, 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 difficile diarrhea 04/17/2019    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   Primary generalized (osteo)arthritis 04/17/2019   Pure hypercholesterolemia, unspecified 04/17/2019   Secondary hyperparathyroidism of renal origin ([place]) 04/17/2019   Gastro-esophageal reflux disease without esophagitis 04/17/2019   Essential (primary) hypertension 04/17/2019   Transient cerebral ischemic attack, unspecified 04/17/2019   Prolonged Q-T interval on ECG 02/24/2017   Malnutrition of moderate degree 12/24/2016   Problem with dialysis access ([place]) 12/21/2016   Neurologic abnormality 11/19/2015   Anxiety 11/19/2015   Insomnia 11/19/2015   Gait instability    Dizziness 05/09/2015   Ataxia 05/09/2015   H/O: CVA (cerebrovascular accident) 05/09/2015   Left facial numbness 05/09/2015   Left leg numbness 05/09/2015   Hyperlipidemia    SOB (shortness of breath) 04/01/2013   ESRD on dialysis ([place]) 11/07/2012   Dyspnea 12/31/2011    Patient's Medications  New Prescriptions   No medications on file  Previous Medications   ALBUTEROL (PROVENTIL) (2.5 MG/3ML) 0.083% NEBULIZER SOLUTION    Take 3 mLs (2.5 mg total) by nebulization every 4 (four) hours as needed for wheezing.   ALBUTEROL (VENTOLIN HFA) 108 (90 BASE) MCG/ACT INHALER    Inhale 1-2 puffs into the lungs every 6 (six) hours as needed for wheezing or shortness of breath.   ATORVASTATIN (LIPITOR) 80 MG TABLET    Take 80 mg by mouth at bedtime.   B COMPLEX-C-FOLIC ACID (DIALYVITE 347) 0.8 MG WAFR    Take 1 tablet by mouth every morning.   BENZONATATE (TESSALON) 200 MG CAPSULE    Take 1 capsule (200 mg total) by mouth 2 (two) times daily as needed for cough.   CLOPIDOGREL (PLAVIX) 75 MG TABLET    Take 1 tablet (75 mg total) by mouth daily.  DEXTROMETHORPHAN-GUAIFENESIN (MUCINEX DM) 30-600 MG 12HR TABLET    Take 1 tablet by mouth 2 (two) times daily.   ESCITALOPRAM (LEXAPRO) 10 MG TABLET    Take 1 tablet (10 mg total) by mouth every morning.   HYDROCORTISONE (ANUSOL-HC) 2.5 % RECTAL  CREAM    Apply 1 Application topically 4 (four) times daily as needed for hemorrhoids.   HYDROXYZINE (ATARAX) 10 MG TABLET    Take 1 tablet (10 mg total) by mouth 3 (three) times daily as needed for itching.   MIDODRINE (PROAMATINE) 10 MG TABLET    Take 1 tablet (10 mg total) by mouth 3 (three) times daily with meals.   PANTOPRAZOLE (PROTONIX) 40 MG TABLET    Take 1 tablet (40 mg total) by mouth daily.   SEVELAMER CARBONATE (RENVELA) 800 MG TABLET    Take 1 tablet (800 mg total) by mouth 3 (three) times daily with meals.  Modified Medications   No medications on file  Discontinued Medications   No medications on file    Subjective: 71 YF with PMHX as below presents for hospital f.u MSSA bacteremia and cdiff. Pt has ESRD on iHD RUE AVF with hospitalization 5/14-5/18 for MSSA bacteremia 2/2 RUE graft infection SP excision of segment of RAVG with revision(interposition 74m PTFE graft) 5/15 Cx+ MSSA discharge on cefazolin x 4 weeks(EOT 6/13)followed by PO suppression. [facility]. Found to have c diff diarrhea(Ag/PCR+) with leukocytosis, placed on PO vanc x 10 days(continued to 7 days following completion of antibiotics. After completion on IV abx plan to do doxy 100 mg PO bid for chronic suppression. On 6/2 she underwent exploration Left arm graft and hematoma evacuation.    7/20: Pt missed appt on 7/7. Son present today at visit. She had not started PO doxy. She denies fever, chill, pain at fistula site, N/V/D.   Today 10/01/22: No new complaints, unsure if she is on doxycyline.  Review of Systems: Review of Systems  All other systems reviewed and are negative.   Past Medical History:  Diagnosis Date   Ac[place] ischemic stroke ([place] 02/23/2017   Acute on chronic diastolic CHF (congestive heart failure) ([place]    Adenomatous polyp of colon 10/2010, 2006, 2015   Anemia in CKD (chronic kidney disease) 11/07/2012   s/p blood transfusion.    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  Had some early rejection then stable function for years, then had slow decline of function and went back on hemodialysis in 2012.  [facility]      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    Social History   Tobacco Use   Smoking status: Former    Types: Cigarettes    Quit date: 12/31/1991    Years since quitting:  30.7    Passive exposure: Never   Smokeless tobacco: Never  Vaping Use   Vaping Use: Never used  Substance Use Topics   Alcohol use: No    Alcohol/week: 0.0 standard drinks of alcohol   Drug use: No    Family History  Problem Relation Age of Onset   Colon cancer Brother    Cancer Brother    Coronary artery disease Mother 31   Hyperlipidemia Mother    Hypertension Mother    Stroke Maternal Aunt    Esophageal cancer Neg Hx    Stomach cancer Neg Hx    Rectal cancer Neg Hx     Allergies  Allergen Reactions   Sulfa Antibiotics Other (See Comments)    Both pt's parents allergic to this type of medication    Adhesive [Tape] Itching    Health Maintenance  Topic Date Due   TETANUS/TDAP  Never done   Zoster Vaccines- Shingrix (1 of 2) Never done   MAMMOGRAM  Never done   DEXA SCAN  Never done   COLONOSCOPY (Pts 45-35yr Insurance coverage will need to be confirmed)  01/10/2019   COVID-19 Vaccine (4 - Pfizer risk series)  01/30/2021   INFLUENZA VACCINE  07/14/2022   Pneumonia Vaccine 71 Years old  Completed   Hepatitis C Screening  Completed   HPV VACCINES  Aged Out    Objective:  There were no vitals filed for this visit. There is no height or weight on file to calculate BMI.  Physical Exam Constitutional:      Appearance: Normal appearance.  HENT:     Head: Normocephalic and atraumatic.     Right Ear: Tympanic membrane normal.     Left Ear: Tympanic membrane normal.     Nose: Nose normal.     Mouth/Throat:     Mouth: Mucous membranes are moist.  Eyes:     Extraocular Movements: Extraocular movements intact.     Conjunctiva/sclera: Conjunctivae normal.     Pupils: Pupils are equal, round, and reactive to light.  Cardiovascular:     Rate and Rhythm: Normal rate and regular rhythm.     Heart sounds: No murmur heard.    No friction rub. No gallop.     Comments: R wound site, bandaged.  Pulmonary:     Effort: Pulmonary effort is normal.     Breath sounds: Normal breath sounds.  Abdominal:     General: Abdomen is flat.     Palpations: Abdomen is soft.  Musculoskeletal:        General: Normal range of motion.  Skin:    General: Skin is warm and dry.  Neurological:     General: No focal deficit present.     Mental Status: She is alert and oriented to person, place, and time.  Psychiatric:        Mood and Affect: Mood normal.     Lab Results Lab Results  Component Value Date   WBC 11.2 (H) 09/16/2022   HGB 12.1 09/16/2022   HCT 34.7 (L) 09/16/2022   MCV 93.8 09/16/2022   PLT 212 09/16/2022    Lab Results  Component Value Date   CREATININE 5.55 (H) 09/16/2022   BUN 70 (H) 09/16/2022   NA 131 (L) 09/16/2022   K 4.6 09/16/2022   CL 92 (L) 09/16/2022   CO2 19 (L) 09/16/2022    Lab Results  Component Value Date   ALT 113 (H) 09/15/2022   AST 121 (H) 09/15/2022  ALKPHOS 157 (H) 09/15/2022   BILITOT 1.0 09/15/2022    Lab Results  Component Value Date   CHOL 191  02/22/2021   HDL 57 02/22/2021   LDLCALC 117 (H) 02/22/2021   TRIG 92 04/28/2022   CHOLHDL 3.4 02/22/2021   Lab Results  Component Value Date   LABRPR Non Reactive 07/13/2018   No results found for: "HIV1RNAQUANT", "HIV1RNAVL", "CD4TABS"   A/P #MSSA bacteremia 2/2 RUE graft infection #SP excision of segment of RAVG with revision(interposition 76m PTFE graft) 5/15 Cx+ MSSA  # C diff diarrhea SP x 4 weeks of cefazolin(EOT 6/13) from RAVG revision with graft. She was readmitted for diarrhea(5/24-6/9), noted to have ciff and recived PO vanc x 10 days followed 7 days following completion of IV abx. I did not see doxy on her med list form paperwork provided from SNF. I spoke to her son(came in after visit ended) in regards to doxy, he is unsure if it was picked up. Pharmacy was able to call pharmacy who noted Doxy was picked up in July.  Plan: -Start doxycyline 100gm PO bid as RUE graft in place(this is my third attempt to convey pt needs to start Doxycyline). -Pt needs to be appropriate clothing to evaluate arm as she has limited ROM. I was able to evaluate right arm, no signs of florid infection -Labs today: blood Cx, cbc,c mp, esr and crp -Follow-up in 6 months   I have personally spent 65 minutes involved in face-to-face and non-face-to-face activities for this patient on the day of the visit. Professional time spent includes the following activities: Preparing to see the patient (review of tests), Obtaining and/or reviewing separately obtained history (admission/discharge record), Performing a medically appropriate examination and/or evaluation , Ordering medications/tests/procedures, referring and communicating with other health care professionals, Documenting clinical information in the EMR, Independently interpreting results (not separately reported), Communicating results to the patient/family/caregiver, Counseling and educating the patient/family/caregiver and Care coordination (not  separately reported).    MLaurice Record MD [facility] 10/01/2022, 10:54 AM

## 2022-10-02 DIAGNOSIS — N186 End stage renal disease: Secondary | ICD-10-CM | POA: Diagnosis not present

## 2022-10-02 DIAGNOSIS — L299 Pruritus, unspecified: Secondary | ICD-10-CM | POA: Diagnosis not present

## 2022-10-02 DIAGNOSIS — Z992 Dependence on renal dialysis: Secondary | ICD-10-CM | POA: Diagnosis not present

## 2022-10-02 DIAGNOSIS — N2581 Secondary hyperparathyroidism of renal origin: Secondary | ICD-10-CM | POA: Diagnosis not present

## 2022-10-02 LAB — CBC WITH DIFFERENTIAL/PLATELET
Absolute Monocytes: 589 cells/uL (ref 200–950)
Basophils Absolute: 12 cells/uL (ref 0–200)
Basophils Relative: 0.2 %
Eosinophils Absolute: 0 cells/uL — ABNORMAL LOW (ref 15–500)
Eosinophils Relative: 0 %
HCT: 36.3 % (ref 35.0–45.0)
Hemoglobin: 11.9 g/dL (ref 11.7–15.5)
Lymphs Abs: 589 cells/uL — ABNORMAL LOW (ref 850–3900)
MCH: 32.7 pg (ref 27.0–33.0)
MCHC: 32.8 g/dL (ref 32.0–36.0)
MCV: 99.7 fL (ref 80.0–100.0)
MPV: 11 fL (ref 7.5–12.5)
Monocytes Relative: 9.5 %
Neutro Abs: 5010 cells/uL (ref 1500–7800)
Neutrophils Relative %: 80.8 %
Platelets: 139 10*3/uL — ABNORMAL LOW (ref 140–400)
RBC: 3.64 10*6/uL — ABNORMAL LOW (ref 3.80–5.10)
RDW: 17.1 % — ABNORMAL HIGH (ref 11.0–15.0)
Total Lymphocyte: 9.5 %
WBC: 6.2 10*3/uL (ref 3.8–10.8)

## 2022-10-02 LAB — COMPLETE METABOLIC PANEL WITH GFR
AG Ratio: 1.5 (calc) (ref 1.0–2.5)
ALT: 17 U/L (ref 6–29)
AST: 19 U/L (ref 10–35)
Albumin: 3.7 g/dL (ref 3.6–5.1)
Alkaline phosphatase (APISO): 134 U/L (ref 37–153)
BUN/Creatinine Ratio: 10 (calc) (ref 6–22)
BUN: 34 mg/dL — ABNORMAL HIGH (ref 7–25)
CO2: 31 mmol/L (ref 20–32)
Calcium: 9.4 mg/dL (ref 8.6–10.4)
Chloride: 94 mmol/L — ABNORMAL LOW (ref 98–110)
Creat: 3.57 mg/dL — ABNORMAL HIGH (ref 0.60–1.00)
Globulin: 2.5 g/dL (calc) (ref 1.9–3.7)
Glucose, Bld: 182 mg/dL — ABNORMAL HIGH (ref 65–99)
Potassium: 2.9 mmol/L — ABNORMAL LOW (ref 3.5–5.3)
Sodium: 141 mmol/L (ref 135–146)
Total Bilirubin: 1.2 mg/dL (ref 0.2–1.2)
Total Protein: 6.2 g/dL (ref 6.1–8.1)
eGFR: 13 mL/min/{1.73_m2} — ABNORMAL LOW (ref >=60)

## 2022-10-02 LAB — SEDIMENTATION RATE: Sed Rate: 6 mm/h (ref 0–30)

## 2022-10-02 LAB — C-REACTIVE PROTEIN: CRP: 11.6 mg/L — ABNORMAL HIGH (ref <8.0)

## 2022-10-02 NOTE — Progress Notes (Signed)
Stable labs

## 2022-10-05 DIAGNOSIS — L299 Pruritus, unspecified: Secondary | ICD-10-CM | POA: Diagnosis not present

## 2022-10-05 DIAGNOSIS — Z992 Dependence on renal dialysis: Secondary | ICD-10-CM | POA: Diagnosis not present

## 2022-10-05 DIAGNOSIS — N2581 Secondary hyperparathyroidism of renal origin: Secondary | ICD-10-CM | POA: Diagnosis not present

## 2022-10-05 DIAGNOSIS — N186 End stage renal disease: Secondary | ICD-10-CM | POA: Diagnosis not present

## 2022-10-07 DIAGNOSIS — N2581 Secondary hyperparathyroidism of renal origin: Secondary | ICD-10-CM | POA: Diagnosis not present

## 2022-10-07 DIAGNOSIS — Z992 Dependence on renal dialysis: Secondary | ICD-10-CM | POA: Diagnosis not present

## 2022-10-07 DIAGNOSIS — L299 Pruritus, unspecified: Secondary | ICD-10-CM | POA: Diagnosis not present

## 2022-10-07 DIAGNOSIS — N186 End stage renal disease: Secondary | ICD-10-CM | POA: Diagnosis not present

## 2022-10-08 ENCOUNTER — Other Ambulatory Visit: Payer: Self-pay

## 2022-10-08 ENCOUNTER — Ambulatory Visit (INDEPENDENT_AMBULATORY_CARE_PROVIDER_SITE_OTHER): Payer: Medicare Other | Admitting: Vascular Surgery

## 2022-10-08 ENCOUNTER — Encounter: Payer: Self-pay | Admitting: Vascular Surgery

## 2022-10-08 VITALS — BP 109/68 | HR 67 | Temp 97.9°F | Resp 20 | Ht 63.0 in | Wt 111.0 lb

## 2022-10-08 DIAGNOSIS — N186 End stage renal disease: Secondary | ICD-10-CM | POA: Diagnosis not present

## 2022-10-08 DIAGNOSIS — Z992 Dependence on renal dialysis: Secondary | ICD-10-CM

## 2022-10-08 NOTE — H&P (View-Only) (Signed)
REASON FOR VISIT:   Requesting ligation of left AV fistula.  The consult is requested by Dr. 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  It is now becoming symptomatic and we have been asked to ligate this.  She has a functioning graft in the right arm.  I think would be reasonable to proceed with ligation of her fistula and potentially excise some of these large aneurysmal areas.  She dialyzes Monday Wednesdays and Fridays and this has been scheduled for Tuesday, 10/13/2022.  We discussed the indications for the procedure and the potential complications and she is agreeable to proceed.  HPI:   [name] is a pleasant 71 y.o. female who was referred over for ligation of a left arm fistula.  This patient states that she had a fistula placed many years ago but that has not been used for many years.  This has become markedly enlarged as documented in the photograph below.  It is now becoming painful and we were asked to ligate this.  This patient had a right upper arm AV graft placed elsewhere on 03/15/2020.  She had presented in May 2023 with a wound over her graft.  This has been bleeding intermittently.  She was taken to the operating room on 04/27/2022 and underwent excision of a segment of the right arm graft with revision.  An interposition 6 mm PTFE graft was tunneled around the graft.   Past Medical History:  Diagnosis Date   Acute ischemic stroke ([place]) 02/23/2017   Acute on chronic diastolic CHF (congestive heart failure) ([place])    Adenomatous polyp of colon 10/2010, 2006, 2015   Anemia in CKD (chronic kidney disease) 11/07/2012   s/p blood transfusion.    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  Had some early rejection then stable function for years, then had slow decline of function and went back on hemodialysis in 2012.  G[facility] HD TTS schedule [facility] still using L forearm AVF.      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    Family History  Problem Relation Age of Onset   Colon cancer Brother    Cancer Brother    Coronary artery disease Mother 74   Hyperlipidemia Mother    Hypertension Mother    Stroke Maternal Aunt    Esophageal cancer Neg Hx    Stomach cancer Neg Hx    Rectal cancer Neg Hx     SOCIAL HISTORY: Social History   Tobacco Use   Smoking status: Former    Types: Cigarettes    Quit date: 12/31/1991    Years since quitting: 30.7    Passive exposure: Never   Smokeless tobacco: Never  Substance  Use Topics   Alcohol use: No    Alcohol/week: 0.0 standard drinks of alcohol    Allergies  Allergen Reactions   Sulfa Antibiotics Other (See Comments)    Both pt's parents allergic to this type of medication    Adhesive [Tape] Itching    Current Outpatient Medications  Medication Sig Dispense Refill   albuterol (PROVENTIL) (2.5 MG/3ML) 0.083% nebulizer solution Take 3 mLs (2.5 mg total) by nebulization every 4 (four) hours as needed for wheezing. (Patient taking differently: Take 2.5 mg by nebulization every 6 (six) hours as needed for wheezing.) 75 mL 12   albuterol (VENTOLIN HFA) 108 (90 Base) MCG/ACT inhaler Inhale 1-2 puffs into the lungs every 6 (six) hours as needed for wheezing or shortness of breath. (Patient taking differently: Inhale  2 puffs into the lungs every 6 (six) hours as needed for shortness of breath.) 1 each 0   atorvastatin (LIPITOR) 80 MG tablet Take 80 mg by mouth at bedtime.     B Complex-C-Folic Acid (DIALYVITE 660) 0.8 MG WAFR Take 1 tablet by mouth every morning.     benzonatate (TESSALON) 200 MG capsule Take 1 capsule (200 mg total) by mouth 2 (two) times daily as needed for cough. 20 capsule 0   clopidogrel (PLAVIX) 75 MG tablet Take 1 tablet (75 mg total) by mouth daily. (Patient taking differently: Take 75 mg by mouth at bedtime.) 30 tablet 11   dextromethorphan-guaiFENesin (MUCINEX DM) 30-600 MG 12hr tablet Take 1 tablet by mouth 2 (two) times daily.     doxycycline (VIBRA-TABS) 100 MG tablet Take 1 tablet (100 mg total) by mouth 2 (two) times daily. 60 tablet 5   escitalopram (LEXAPRO) 10 MG tablet Take 1 tablet (10 mg total) by mouth every morning. 8 tablet 0   hydrocortisone (ANUSOL-HC) 2.5 % rectal cream Apply 1 Application topically 4 (four) times daily as needed for hemorrhoids. 30 g 0   hydrOXYzine (ATARAX) 10 MG tablet Take 1 tablet (10 mg total) by mouth 3 (three) times daily as needed for itching. 30 tablet 0   midodrine (PROAMATINE) 10 MG tablet Take 1 tablet (10 mg total) by mouth 3 (three) times daily with meals.     pantoprazole (PROTONIX) 40 MG tablet Take 1 tablet (40 mg total) by mouth daily. 60 tablet    sevelamer carbonate (RENVELA) 800 MG tablet Take 1 tablet (800 mg total) by mouth 3 (three) times daily with meals.     No current facility-administered medications for this visit.    REVIEW OF SYSTEMS:  '[X]'$  denotes positive finding, '[ ]'$  denotes negative finding Cardiac  Comments:  Chest pain or chest pressure:    Shortness of breath upon exertion:    Short of breath when lying flat:    Irregular heart rhythm:        Vascular    Pain in calf, thigh, or hip brought on by ambulation:    Pain in feet at night that wakes you up from your sleep:     Blood clot in your veins:    Leg  swelling:         Pulmonary    Oxygen at home:    Productive cough:     Wheezing:         Neurologic    Sudden weakness in arms or legs:     Sudden numbness in arms or legs:     Sudden onset of difficulty speaking or slurred speech:    Temporary loss of  vision in one eye:     Problems with dizziness:         Gastrointestinal    Blood in stool:     Vomited blood:         Genitourinary    Burning when urinating:     Blood in urine:        Psychiatric    Major depression:         Hematologic    Bleeding problems:    Problems with blood clotting too easily:        Skin    Rashes or ulcers:        Constitutional    Fever or chills:     PHYSICAL EXAM:   Vitals:   10/08/22 1442  BP: 109/68  Pulse: 67  Resp: 20  Temp: 97.9 F (36.6 C)  SpO2: 91%  Weight: 111 lb (50.3 kg)  Height: '5\' 3"'$  (1.6 m)    GENERAL: The patient is a well-nourished female, in no acute distress. The vital signs are documented above. CARDIAC: There is a regular rate and rhythm.  VASCULAR: She has a palpable right radial pulse.  I do not palpate a left radial pulse. Her right upper arm graft has a good thrill.  It was used for dialysis yesterday. She has a markedly enlarged upper arm fistula which is aneurysmal and tortuous as documented in the photograph below.   PULMONARY: There is good air exchange bilaterally without wheezing or rales. MUSCULOSKELETAL: There are no major deformities or cyanosis. NEUROLOGIC: No focal weakness or paresthesias are detected. SKIN: There are no ulcers or rashes noted. PSYCHIATRIC: The patient has a normal affect.  DATA:    No new data  [name] [facility] [phone]

## 2022-10-08 NOTE — Progress Notes (Signed)
REASON FOR VISIT:   Requesting ligation of left AV fistula.  The consult is requested by Dr. 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  It is now becoming symptomatic and we have been asked to ligate this.  She has a functioning graft in the right arm.  I think would be reasonable to proceed with ligation of her fistula and potentially excise some of these large aneurysmal areas.  She dialyzes Monday Wednesdays and Fridays and this has been scheduled for Tuesday, 10/13/2022.  We discussed the indications for the procedure and the potential complications and she is agreeable to proceed.  HPI:   [name] is a pleasant 71 y.o. female who was referred over for ligation of a left arm fistula.  This patient states that she had a fistula placed many years ago but that has not been used for many years.  This has become markedly enlarged as documented in the photograph below.  It is now becoming painful and we were asked to ligate this.  This patient had a right upper arm AV graft placed elsewhere on 03/15/2020.  She had presented in May 2023 with a wound over her graft.  This has been bleeding intermittently.  She was taken to the operating room on 04/27/2022 and underwent excision of a segment of the right arm graft with revision.  An interposition 6 mm PTFE graft was tunneled around the graft.   Past Medical History:  Diagnosis Date   Acute ischemic stroke ([place]) 02/23/2017   Acute on chronic diastolic CHF (congestive heart failure) ([place])    Adenomatous polyp of colon 10/2010, 2006, 2015   Anemia in CKD (chronic kidney disease) 11/07/2012   s/p blood transfusion.    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  Had some early rejection then stable function for years, then had slow decline of function and went back on hemodialysis in 2012.  Gets HD TTS schedule at [facility] on [facility] still using L forearm AVF.      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    Family History  Problem Relation Age of Onset   Colon cancer Brother    Cancer Brother    Coronary artery disease Mother 24   Hyperlipidemia Mother    Hypertension Mother    Stroke Maternal Aunt    Esophageal cancer Neg Hx    Stomach cancer Neg Hx    Rectal cancer Neg Hx     SOCIAL HISTORY: Social History   Tobacco Use   Smoking status: Former    Types: Cigarettes    Quit date: 12/31/1991    Years since quitting: 30.7    Passive exposure: Never   Smokeless tobacco: Never  Substance  Use Topics   Alcohol use: No    Alcohol/week: 0.0 standard drinks of alcohol    Allergies  Allergen Reactions   Sulfa Antibiotics Other (See Comments)    Both pt's parents allergic to this type of medication    Adhesive [Tape] Itching    Current Outpatient Medications  Medication Sig Dispense Refill   albuterol (PROVENTIL) (2.5 MG/3ML) 0.083% nebulizer solution Take 3 mLs (2.5 mg total) by nebulization every 4 (four) hours as needed for wheezing. (Patient taking differently: Take 2.5 mg by nebulization every 6 (six) hours as needed for wheezing.) 75 mL 12   albuterol (VENTOLIN HFA) 108 (90 Base) MCG/ACT inhaler Inhale 1-2 puffs into the lungs every 6 (six) hours as needed for wheezing or shortness of breath. (Patient taking differently: Inhale  2 puffs into the lungs every 6 (six) hours as needed for shortness of breath.) 1 each 0   atorvastatin (LIPITOR) 80 MG tablet Take 80 mg by mouth at bedtime.     B Complex-C-Folic Acid (DIALYVITE 951) 0.8 MG WAFR Take 1 tablet by mouth every morning.     benzonatate (TESSALON) 200 MG capsule Take 1 capsule (200 mg total) by mouth 2 (two) times daily as needed for cough. 20 capsule 0   clopidogrel (PLAVIX) 75 MG tablet Take 1 tablet (75 mg total) by mouth daily. (Patient taking differently: Take 75 mg by mouth at bedtime.) 30 tablet 11   dextromethorphan-guaiFENesin (MUCINEX DM) 30-600 MG 12hr tablet Take 1 tablet by mouth 2 (two) times daily.     doxycycline (VIBRA-TABS) 100 MG tablet Take 1 tablet (100 mg total) by mouth 2 (two) times daily. 60 tablet 5   escitalopram (LEXAPRO) 10 MG tablet Take 1 tablet (10 mg total) by mouth every morning. 8 tablet 0   hydrocortisone (ANUSOL-HC) 2.5 % rectal cream Apply 1 Application topically 4 (four) times daily as needed for hemorrhoids. 30 g 0   hydrOXYzine (ATARAX) 10 MG tablet Take 1 tablet (10 mg total) by mouth 3 (three) times daily as needed for itching. 30 tablet 0   midodrine (PROAMATINE) 10 MG tablet Take 1 tablet (10 mg total) by mouth 3 (three) times daily with meals.     pantoprazole (PROTONIX) 40 MG tablet Take 1 tablet (40 mg total) by mouth daily. 60 tablet    sevelamer carbonate (RENVELA) 800 MG tablet Take 1 tablet (800 mg total) by mouth 3 (three) times daily with meals.     No current facility-administered medications for this visit.    REVIEW OF SYSTEMS:  '[X]'$  denotes positive finding, '[ ]'$  denotes negative finding Cardiac  Comments:  Chest pain or chest pressure:    Shortness of breath upon exertion:    Short of breath when lying flat:    Irregular heart rhythm:        Vascular    Pain in calf, thigh, or hip brought on by ambulation:    Pain in feet at night that wakes you up from your sleep:     Blood clot in your veins:    Leg  swelling:         Pulmonary    Oxygen at home:    Productive cough:     Wheezing:         Neurologic    Sudden weakness in arms or legs:     Sudden numbness in arms or legs:     Sudden onset of difficulty speaking or slurred speech:    Temporary loss of  vision in one eye:     Problems with dizziness:         Gastrointestinal    Blood in stool:     Vomited blood:         Genitourinary    Burning when urinating:     Blood in urine:        Psychiatric    Major depression:         Hematologic    Bleeding problems:    Problems with blood clotting too easily:        Skin    Rashes or ulcers:        Constitutional    Fever or chills:     PHYSICAL EXAM:   Vitals:   10/08/22 1442  BP: 109/68  Pulse: 67  Resp: 20  Temp: 97.9 F (36.6 C)  SpO2: 91%  Weight: 111 lb (50.3 kg)  Height: '5\' 3"'$  (1.6 m)    GENERAL: The patient is a well-nourished female, in no acute distress. The vital signs are documented above. CARDIAC: There is a regular rate and rhythm.  VASCULAR: She has a palpable right radial pulse.  I do not palpate a left radial pulse. Her right upper arm graft has a good thrill.  It was used for dialysis yesterday. She has a markedly enlarged upper arm fistula which is aneurysmal and tortuous as documented in the photograph below.   PULMONARY: There is good air exchange bilaterally without wheezing or rales. MUSCULOSKELETAL: There are no major deformities or cyanosis. NEUROLOGIC: No focal weakness or paresthesias are detected. SKIN: There are no ulcers or rashes noted. PSYCHIATRIC: The patient has a normal affect.  DATA:    No new data  [name] [phone]

## 2022-10-09 DIAGNOSIS — N2581 Secondary hyperparathyroidism of renal origin: Secondary | ICD-10-CM | POA: Diagnosis not present

## 2022-10-09 DIAGNOSIS — N186 End stage renal disease: Secondary | ICD-10-CM | POA: Diagnosis not present

## 2022-10-09 DIAGNOSIS — Z992 Dependence on renal dialysis: Secondary | ICD-10-CM | POA: Diagnosis not present

## 2022-10-09 DIAGNOSIS — L299 Pruritus, unspecified: Secondary | ICD-10-CM | POA: Diagnosis not present

## 2022-10-12 ENCOUNTER — Encounter (HOSPITAL_COMMUNITY): Payer: Self-pay | Admitting: Vascular Surgery

## 2022-10-12 DIAGNOSIS — N2581 Secondary hyperparathyroidism of renal origin: Secondary | ICD-10-CM | POA: Diagnosis not present

## 2022-10-12 DIAGNOSIS — N186 End stage renal disease: Secondary | ICD-10-CM | POA: Diagnosis not present

## 2022-10-12 DIAGNOSIS — Z992 Dependence on renal dialysis: Secondary | ICD-10-CM | POA: Diagnosis not present

## 2022-10-12 DIAGNOSIS — L299 Pruritus, unspecified: Secondary | ICD-10-CM | POA: Diagnosis not present

## 2022-10-12 NOTE — Anesthesia Preprocedure Evaluation (Signed)
Anesthesia Evaluation  Patient identified by MRN, date of birth, ID band Patient awake and Patient confused    Reviewed: Allergy & Precautions, NPO status , Patient's Chart, lab work & pertinent test results  History of Anesthesia Complications Negative for: history of anesthetic complications  Airway Mallampati: II  TM Distance: >3 FB Neck ROM: Full    Dental  (+) Poor Dentition, Chipped, Missing, Dental Advisory Given, Loose   Pulmonary COPD,  COPD inhaler and oxygen dependent, former smoker   breath sounds clear to auscultation       Cardiovascular hypertension (now requiring mitodrine support), (-) angina + CAD, + Past MI and + Peripheral Vascular Disease   Rhythm:Regular Rate:Normal  Limited Echo 04/28/22: IMPRESSIONS  1. No left ventricular thrombus is seen. Left EF 50-55%. The left ventricle has low normal function, moderate hypokinesis of the left ventricular entire inferolateral wall.  2. The right ventricular size is moderately enlarged. There is severely elevated pulmonary artery systolic pressure. The estimated RV systolic pressure is 41.9 mmHg.  3. Left atrial size was moderately dilated.  4. Right atrial size was moderately dilated.  5. Mild to moderate mitral valve regurgitation. No evidence of mitral stenosis.  6. Tricuspid valve regurgitation is moderate to severe.  7. The aortic valve is tricuspid. There is moderate calcification of the aortic valve. There is moderate thickening of the aortic valve. Aortic valve regurgitation is trivial. Aortic valve sclerosis/calcification is present, without any evidence of aortic stenosis.  8. The inferior vena cava is dilated in size with <50% respiratory  variability, suggesting right atrial pressure of 15 mmHg.  - Conclusion(s)/Recommendation(s): No evidence of valvular vegetations on this transthoracic echocardiogram. Consider a transesophageal echocardiogram to exclude  infective endocarditis if clinically indicated.   '21 Stress test: normal    Neuro/Psych   Anxiety Depression    CVA (L arm weakness), Residual Symptoms    GI/Hepatic ,GERD  Medicated and Poorly Controlled,,  Endo/Other  negative endocrine ROS    Renal/GU ESRF and DialysisRenal disease (MWF)K+ 4.6     Musculoskeletal  (+) Arthritis ,    Abdominal   Peds  Hematology negative hematology ROS (+)   Anesthesia Other Findings   Reproductive/Obstetrics                             Anesthesia Physical Anesthesia Plan  ASA: 4  Anesthesia Plan: MAC and Regional   Post-op Pain Management: Tylenol PO (pre-op)*   Induction:   PONV Risk Score and Plan: 2 and Ondansetron and Treatment may vary due to age or medical condition  Airway Management Planned: Simple Face Mask and Natural Airway  Additional Equipment: None  Intra-op Plan:   Post-operative Plan:   Informed Consent: I have reviewed the patients History and Physical, chart, labs and discussed the procedure including the risks, benefits and alternatives for the proposed anesthesia with the patient or authorized representative who has indicated his/her understanding and acceptance.     Dental advisory given  Plan Discussed with: CRNA and Surgeon  Anesthesia Plan Comments: (See PAT note written 10/12/2022 by Myra Gianotti, PA-C.  Plan routine monitors, interscalene block with MAC  Pt changed her mind, does not want surgery today)        Anesthesia Quick Evaluation

## 2022-10-12 NOTE — Progress Notes (Signed)
Surgical Instructions for [facility] Day of Surgery is Tuesday, 10/13/22.    Your procedure is scheduled on Tues., 10/13/22.  Report to [facility] Main Entrance "A" at 7:15 A.M., then check in with the Admitting office.  Call this number if you have problems the morning of surgery:  [phone]   If you have any questions prior to your surgery date call [phone]: Open Monday-Friday 8am-4pm If you experience any cold or flu symptoms such as cough, fever, chills, shortness of breath, etc. between now and your scheduled surgery, please notify us at the above number     Remember:  Do not eat or drink after midnight tonight (Monday, 10/12/22).     Take these medicines the morning of surgery with A SIP OF WATER: None  (Patient takes meds with applesauce).   Ok to use albuterol inhaler if needed.   As of today, STOP taking any Aspirin (unless otherwise instructed by your surgeon) Aleve, Naproxen, Ibuprofen, Motrin, Advil, Goody's, BC's, all herbal medications, fish oil, and all vitamins.           Do not wear jewelry or makeup. Do not wear lotions, powders, perfumes or deodorant. Do not shave 48 hours prior to surgery.   Do not bring valuables to the hospital. Do not wear nail polish, gel polish, artificial nails, or any other type of covering on natural nails (fingers and toes) If you have artificial nails or gel coating that need to be removed by a nail salon, please have this removed prior to surgery. Artificial nails or gel coating may interfere with anesthesia's ability to adequately monitor your vital signs.  [facility] is not responsible for any belongings or valuables.     Contacts, glasses, hearing aids, dentures or partials may not be worn into surgery, please bring cases for these belongings   Patients discharged the day of surgery will not be allowed to drive home, and someone needs to stay with them for 24 hours.   SURGICAL WAITING ROOM VISITATION Patients  having surgery or a procedure may have no more than 2 support people in the waiting area - these visitors may rotate.   Children under the age of 34 must have an adult with them who is not the patient. If the patient needs to stay at the hospital during part of their recovery, the visitor guidelines for inpatient rooms apply. Pre-op nurse will coordinate an appropriate time for 1 support person to accompany patient in pre-op.  This support person may not rotate.   Please refer to [email or web address] for the visitor guidelines for Inpatients (after your surgery is over and you are in a regular room).    Special instructions:    Oral Hygiene is also important to reduce your risk of infection.  Remember - BRUSH YOUR TEETH THE MORNING OF SURGERY WITH YOUR REGULAR TOOTHPASTE.

## 2022-10-12 NOTE — Progress Notes (Signed)
Anesthesia Chart Review: [name]  Case: [id number] Date/Time: 10/13/22 0929   Procedure: EXCISION OF LEFT ARTERIOVENOUS FISTULA (Left)   Anesthesia type: Choice   Pre-op diagnosis: ESRD   Location: [facility] OR ROOM 09 / [place] [facility]   Surgeons: Angelia Mould, MD       DISCUSSION: Patient is a 71 year old female scheduled for the above procedure. She has ESRD on hemodialysis, s/p segment excision of RUE AVGG on 04/27/22 and exploration and evacuation of RUE hematoma 05/15/22. By 10/08/22 VVS notes, the RUE AVGG is now functioning, but she has a markedly enlarged aneurysmal LUE AVF that has not been used and is becoming symptomatic. Nephrology referred for ligation of LUE AVF.   History includes former smoker (quit 12/31/91), HTN, HLD, ESRD (renal transplant 1996, resumed HD 2012, HD MWF), GERD, anemia, CAD (NSTEMI 05/2020, 04/2022), chronic diastolic CHF, PAF (s/p DCCV 04/02/20, no anticoagulation due to bleeding), CVA (CVA 02/23/17; TIA 02/21/21), PAD (left distal SFA/above-knee popliteal artery angioplasty/sent, left peroneal angioplasty 03/26/22), spinal surgery (C4-6 ACDF 03/07/02; C4-5 ACDF, removal of C4-6 plate 08/21/04; M3-5 X-STOP 12/08/05), cholecystectomy (12/02/12), diverticulitis (admission 08/2021, 01/2022).  [place] admission 09/10/22-09/17/22 for septic shock versus hypovolemic shock in setting of COVID-19. Briefly required vasopressors, transitioned to midodrine. Oral antihypertensives held. CXR did not show PNA. CTA chest negative for PE. She received vancomycin, Augment, tocilizumab, bacricitinib, Decadron. Troponins elevated but not felt to represent ACS, but rather due to underlying ESRD.  Nephrology managed HD.   Last cardiology evaluation was during 05/06/22-05/21/22 admission acute on chronic diastolic CHF, NSTEMI, C-difficile diarrhea in setting of recent/ongoing therapy for septic shock with MSSA bacteremia related to [place] infection (hospitalized 04/26/22-05/04/22, required intubation,  pressor support, resection/replacement of RUE AVGG segment). Per 05/14/22 cardiology note by Sanda Klein, MD, "Asked to weigh in again on management of coronary artery disease.   She had angina last night, associated with shortness of breath, lasting for roughly 45 minutes.  ECG performed during pain did not show acute ST segment or T wave changes, but cardiac troponin increased slightly from 500-800.   She was able to walk this morning with physical therapy without chest discomfort.  Roughly 15 minutes after laying back in bed she had a single brief episode of nonsustained VT for 9 beats.   Currently she is completely asymptomatic, but feels very tired.  She denies dyspnea or chest discomfort.  She is oriented and aware of the seriousness of her medical condition.   She appears cachectic.  She does not have any respiratory difficulty lying fully supine in bed (she appears to be euvolemic from a left heart failure point of view), but has very prominent jugular venous distention and prominent V waves due to known tricuspid insufficiency.  Good thrill and bruit over her right forearm fistula, large old fistula in the left arm area.   The diagnosis of coronary artery disease is not in doubt, based on the extent of coronary artery calcification on non-- dedicated imaging studies and the wealth of problems with peripheral arterial disease as well as multiple risk factors.  In fact, the suspicion is that she has multivessel coronary artery disease as a cause for her diastolic heart failure.  For example, the noncontrast chest CT performed on 04/26/2022 shows severe calcification of all 3 coronary arteries (the entire trajectory of the right coronary artery looks like a full jacket of calcium) as well as circumferential calcification of the entire thoracic aorta.   She has previously  declined coronary angiography and did so again during our discussion today.  Moreover, if we were to perform coronary  angiography is highly likely that we would find extensive multivessel CAD that would not be amenable to percutaneous revascularization, but rather would require surgical revascularization (for which she is by no means a candidate) or medical therapy (which we are attempting currently.   Recovering from AV graft infection and Staph aureus bacteremia.  Still has issues with C. difficile infection, hypotension requiring midodrine (which limits the use of antianginal medications).   It is very likely that she has additional myocardium that is ischemic and at risk.  It is also likely that she will continue to have episodes of angina pectoris with "piecemeal" non-STEMI for weeks to come, which may be triggered by periods of severe hypotension or volume overload.  The best option remains chronic treatment with antiplatelet therapy (clopidogrel), beta-blocker in the highest dose tolerated by blood pressure, high-dose statin.  Blood pressure will not allow addition of long-acting nitrates, but she can be administered sublingual nitroglycerin as needed.  No room for calcium channel blockers and renal function precludes use of Ranexa.   She reaffirms desire for DNR status.  Her prognosis is poor.  Palliative care evaluation is appropriate."  Low risk stress test 02/2020. Echo 04/28/22 showed LVEF 50-55%, moderate inferolateral hypokinesis, RVSP 69.5 mmHg, mild-moderate MR, moderate-severe TR.   Ok to continue Plavix per posting.   Presumed CAD that is being medically managed as outlined above. Communicated history to anesthesiologist Hoy Morn, MD. Patient is a same day work-up, so anesthesia team to evaluate on the day of surgery and definitve plan made at that time.     VS:  BP Readings from Last 3 Encounters:  10/08/22 109/68  09/17/22 (!) 91/51  09/07/22 (!) 102/55   Pulse Readings from Last 3 Encounters:  10/08/22 67  09/17/22 73  09/07/22 62     PROVIDERS: [name], MD is PCP  Madelon Lips, MD is nephrologist Laurice Record, MD is ID. Last visit 10/01/22 for follow-up MSSA bacteremia due to [place] infection and C. Difficile diarrhea 04/2021. S/p cefazolin. Advised to complete oral doxycyline previously prescribed. Six months follow-up planned.    LABS: Most recent labs in [facility] include: Lab Results  Component Value Date   WBC 6.2 10/01/2022   HGB 11.9 10/01/2022   HCT 36.3 10/01/2022   PLT 139 (L) 10/01/2022   GLUCOSE 182 (H) 10/01/2022   ALT 17 10/01/2022   AST 19 10/01/2022   NA 141 10/01/2022   K 2.9 (L) 10/01/2022   CL 94 (L) 10/01/2022   CREATININE 3.57 (H) 10/01/2022   BUN 34 (H) 10/01/2022   CO2 31 10/01/2022   INR 1.8 (H) 09/10/2022   HGBA1C 5.5 09/10/2022     IMAGES: CTA Chest 09/10/22: IMPRESSION: 1. No evidence for pulmonary embolism. 2. New mediastinal lymphadenopathy of uncertain etiology. 3. Cardiomegaly. 4. Stable micro nodules and tree-in-bud opacities in the right upper lobe and right lower lobe, likely infectious/inflammatory. Aortic Atherosclerosis (ICD10-I70.0).   CXR 09/10/22: IMPRESSION: No active disease. No evidence of pneumonia or pulmonary edema. Stable cardiomegaly.   EKG: 09/10/22:  Sinus rhythm Right axis deviation Nonspecific T abnormalities, lateral leads Prolonged QT interval (QT/QTcB 493/536 ms) No significant change since last tracing Confirmed by Blanchie Dessert [phone]) on 09/10/2022 12:37:43 PM   CV: Limited Echo 04/28/22: IMPRESSIONS   1. No left ventricular thrombus is seen. Left ventricular ejection  fraction, by estimation, is 50  to 55%. The left ventricle has low normal  function. The left ventricle demonstrates regional wall motion  abnormalities (see scoring diagram/findings for  description). There is moderate hypokinesis of the left ventricular,  entire inferolateral wall.   2. The right ventricular size is moderately enlarged. There is severely  elevated pulmonary artery systolic pressure.  The estimated right  ventricular systolic pressure is 97.6 mmHg.   3. Left atrial size was moderately dilated.   4. Right atrial size was moderately dilated.   5. Mild to moderate mitral valve regurgitation. No evidence of mitral  stenosis.   6. Tricuspid valve regurgitation is moderate to severe.   7. The aortic valve is tricuspid. There is moderate calcification of the  aortic valve. There is moderate thickening of the aortic valve. Aortic  valve regurgitation is trivial. Aortic valve sclerosis/calcification is  present, without any evidence of  aortic stenosis.   8. The inferior vena cava is dilated in size with <50% respiratory  variability, suggesting right atrial pressure of 15 mmHg.  - Conclusion(s)/Recommendation(s): No evidence of valvular vegetations on  this transthoracic echocardiogram. Consider a transesophageal  echocardiogram to exclude infective endocarditis if clinically indicated.    Nuclear stress test 02/20/20: Nuclear stress EF: 69%. There was no ST segment deviation noted during stress. The study is normal. This is a low risk study. The left ventricular ejection fraction is hyperdynamic (>65%).   US Carotid 07/23/22: IMPRESSION: Color duplex indicates minimal heterogeneous and calcified plaque, with no hemodynamically significant stenosis by duplex criteria in the extracranial cerebrovascular circulation.    Event monitor 04/06/17-05/04/17: Sinus rhythm Rare premature atrial contractions Nonsustained atrial tachycardia No sustained arrhythmias Baseline artifact limits interpretation   Past Medical History:  Diagnosis Date   Acute ischemic stroke ([place]) 02/23/2017   Acute on chronic diastolic CHF (congestive heart failure) ([place])    Adenomatous polyp of colon 10/2010, 2006, 2015   Anemia in CKD (chronic kidney disease) 11/07/2012   s/p blood transfusion.    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  Had some early rejection then stable function for years, then had slow decline of function and went back on hemodialysis in 2012.  Gets HD TTS schedule at [facility] still using L forearm AVF.      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    Past Surgical History:  Procedure Laterality Date   ABDOMINAL AORTOGRAM W/LOWER EXTREMITY N/A 03/26/2022   Procedure: ABDOMINAL AORTOGRAM W/LOWER EXTREMITY;  Surgeon: Marty Heck, MD;  Location: [place] [facility];  Service: Cardiovascular;  Laterality: N/A;  Left   AV FISTULA PLACEMENT     for dialysis   AV FISTULA PLACEMENT Left 11/22/2015   Procedure: ARTERIOVENOUS (AV) FISTULA CREATION-LEFT BRACHIOCEPHALIC;  Surgeon: Serafina Mitchell, MD;  Location: [place];  Service: Vascular;  Laterality: Left;   AV  FISTULA PLACEMENT Right 03/15/2020   Procedure: INSERTION OF ARTERIOVENOUS (AV) GORE-TEX GRAFT ARM ( BRACHIAL AXILLARY );  Surgeon: Katha Cabal, MD;  Location: [facility];  Service: Vascular;  Laterality: Right;   AV FISTULA PLACEMENT Right 05/15/2022   Procedure: EXPLORATION  OF  RIGHT ARM ARTERIOVENOUS GORE-TEX GRAFT;  Surgeon: Angelia Mould, MD;  Location: [place];  Service: Vascular;  Laterality: Right;   [place] REMOVAL Right 04/27/2022   Procedure: REVISION OF RIGHT ARTERIOVENOUS GRAFT AND EXCISION OF RIGHT DEGENERATED GRAFT (AVG);  Surgeon: Angelia Mould, MD;  Location: [place];  Service: Vascular;  Laterality: Right;    BACK SURGERY     CERVICAL FUSION     CHOLECYSTECTOMY  12/02/2012   Procedure: LAPAROSCOPIC CHOLECYSTECTOMY WITH INTRAOPERATIVE CHOLANGIOGRAM;  Surgeon: Edward Jolly, MD;  Location: [place];  Service: General;  Laterality: N/A;   EYE SURGERY Bilateral    cataract surgery   EYE SURGERY Left 2019   laser   HEMATOMA EVACUATION Left 12/24/2016   Procedure: EVACUATION HEMATOMA LEFT UPPER ARM;  Surgeon: Waynetta Sandy, MD;  Location: [place];  Service: Vascular;  Laterality: Left;   I & D EXTREMITY Left 12/31/2016   Procedure: IRRIGATION AND DEBRIDEMENT EXTREMITY;  Surgeon: Angelia Mould, MD;  Location: [place];  Service: Vascular;  Laterality: Left;   INSERTION OF DIALYSIS CATHETER Right 12/24/2016   Procedure: INSERTION OF DIALYSIS CATHETER;  Surgeon: Waynetta Sandy, MD;  Location: [place];  Service: Vascular;  Laterality: Right;   INSERTION OF DIALYSIS CATHETER Right 02/04/2017   Procedure: INSERTION OF DIALYSIS CATHETER;  Surgeon: Waynetta Sandy, MD;  Location: [place];  Service: Vascular;  Laterality: Right;   [place] Left 10/23/2016   Procedure: Fistulagram;  Surgeon: Elam Dutch, MD;  Location: [place] [facility];  Service: Cardiovascular;  Laterality: Left;   PERIPHERAL VASCULAR INTERVENTION  03/26/2022   Procedure: PERIPHERAL VASCULAR INTERVENTION;  Surgeon: Marty Heck, MD;  Location: [place] [facility];  Service: Cardiovascular;;  left sfa   PERIPHERAL VASCULAR THROMBECTOMY Right 04/16/2020   Procedure: PERIPHERAL VASCULAR THROMBECTOMY;  Surgeon: Katha Cabal, MD;  Location: [place] [facility];  Service: Cardiovascular;  Laterality: Right;   RESECTION OF ARTERIOVENOUS FISTULA ANEURYSM Left 11/22/2015   Procedure: RESECTION OF LEFT RADIOCEPHALIC FISTULA ANEURYSM ;  Surgeon: Serafina Mitchell, MD;  Location: [place] [facility];  Service: Vascular;  Laterality: Left;   REVISON OF ARTERIOVENOUS FISTULA  Left 12/22/2016   Procedure: REVISON OF LEFT ARTERIOVENOUS FISTULA;  Surgeon: Waynetta Sandy, MD;  Location: [place];  Service: Vascular;  Laterality: Left;   REVISON OF ARTERIOVENOUS FISTULA Left 02/04/2017   Procedure: REVISON OF LEFT UPPER ARM ARTERIOVENOUS FISTULA;  Surgeon: Waynetta Sandy, MD;  Location: [place];  Service: Vascular;  Laterality: Left;   UPPER EXTREMITY ANGIOGRAPHY Bilateral 09/19/2019   Procedure: UPPER EXTREMITY ANGIOGRAPHY;  Surgeon: Katha Cabal, MD;  Location: [place] [facility];  Service: Cardiovascular;  Laterality: Bilateral;    MEDICATIONS: No current facility-administered medications for this encounter.    albuterol (PROVENTIL) (2.5 MG/3ML) 0.083% nebulizer solution   albuterol (VENTOLIN HFA) 108 (90 Base) MCG/ACT inhaler   atorvastatin (LIPITOR) 80 MG tablet   B Complex-C-Folic Acid (DIALYVITE 106) 0.8 MG WAFR   benzonatate (TESSALON) 200 MG capsule   clopidogrel (PLAVIX) 75 MG tablet   Dextromethorphan-guaiFENesin 20-400 MG TABS   doxycycline (VIBRA-TABS) 100 MG tablet   escitalopram (LEXAPRO) 10  MG tablet   hydrocortisone (ANUSOL-HC) 2.5 % rectal cream   hydrOXYzine (ATARAX) 10 MG tablet   loperamide (IMODIUM) 2 MG capsule   midodrine (PROAMATINE) 10 MG tablet   pantoprazole (PROTONIX) 40 MG tablet   sevelamer carbonate (RENVELA) 800 MG tablet    Myra Gianotti, PA-C Surgical Short Stay/Anesthesiology [facility] Phone [phone] [facility] Phone [phone] 10/12/2022 2:21 PM

## 2022-10-12 NOTE — Progress Notes (Signed)
Patient resides at [facility].  Spoke with Nurse Phineas Real for PAT information.  Faxed instructions for DOS to [phone].  PCP - Dr [name] Cardiologist - Dr Lauree Chandler Nephrology - Dr Madelon Lips Infection Disease - Dr Laurice Record  Chest x-ray - 09/10/22 EKG - 09/10/22 Stress Test - 02/20/20 ECHO Limited - 04/28/22 Cardiac Cath - n/a  ICD Pacemaker/Loop - n/a  Sleep Study -  n/a CPAP - none  Blood Thinner Instructions:  Follow your surgeon's instructions on when to stop Plavix prior to surgery.  Anesthesia review: Yes  STOP now taking any Aspirin (unless otherwise instructed by your surgeon), Aleve, Naproxen, Ibuprofen, Motrin, Advil, Goody's, BC's, all herbal medications, fish oil, and all vitamins.   Coronavirus Screening Does the patient have any of the following symptoms:  Cough Yes Fever (>100.78F)  yes/no: No Runny nose yes/no: No Sore throat yes/no: No Difficulty breathing/shortness of breath  Yes  Have you traveled in the last 14 days and where? yes/no: No

## 2022-10-13 ENCOUNTER — Encounter (HOSPITAL_COMMUNITY)
Admission: RE | Disposition: A | Discharge status: Skilled Nursing Facility | Payer: Self-pay | Source: Home / Self Care | Attending: Vascular Surgery

## 2022-10-13 ENCOUNTER — Ambulatory Visit (HOSPITAL_COMMUNITY): Payer: Medicare Other | Admitting: Physician Assistant

## 2022-10-13 ENCOUNTER — Telehealth: Payer: Self-pay

## 2022-10-13 ENCOUNTER — Encounter (HOSPITAL_COMMUNITY): Payer: Self-pay | Admitting: Vascular Surgery

## 2022-10-13 ENCOUNTER — Ambulatory Visit (HOSPITAL_COMMUNITY)
Admission: RE | Admit: 2022-10-13 | Discharge: 2022-10-13 | Disposition: A | Discharge status: Skilled Nursing Facility | Payer: Medicare Other | Attending: Vascular Surgery | Admitting: Vascular Surgery

## 2022-10-13 ENCOUNTER — Other Ambulatory Visit: Payer: Self-pay

## 2022-10-13 DIAGNOSIS — D631 Anemia in chronic kidney disease: Secondary | ICD-10-CM | POA: Insufficient documentation

## 2022-10-13 DIAGNOSIS — I2510 Atherosclerotic heart disease of native coronary artery without angina pectoris: Secondary | ICD-10-CM | POA: Diagnosis not present

## 2022-10-13 DIAGNOSIS — I700 Atherosclerosis of aorta: Secondary | ICD-10-CM | POA: Diagnosis not present

## 2022-10-13 DIAGNOSIS — I5032 Chronic diastolic (congestive) heart failure: Secondary | ICD-10-CM | POA: Diagnosis not present

## 2022-10-13 DIAGNOSIS — Z5320 Procedure and treatment not carried out because of patient's decision for unspecified reasons: Secondary | ICD-10-CM | POA: Insufficient documentation

## 2022-10-13 DIAGNOSIS — I491 Atrial premature depolarization: Secondary | ICD-10-CM | POA: Diagnosis not present

## 2022-10-13 DIAGNOSIS — M1990 Unspecified osteoarthritis, unspecified site: Secondary | ICD-10-CM | POA: Insufficient documentation

## 2022-10-13 DIAGNOSIS — J449 Chronic obstructive pulmonary disease, unspecified: Secondary | ICD-10-CM | POA: Diagnosis not present

## 2022-10-13 DIAGNOSIS — I739 Peripheral vascular disease, unspecified: Secondary | ICD-10-CM | POA: Insufficient documentation

## 2022-10-13 DIAGNOSIS — K219 Gastro-esophageal reflux disease without esophagitis: Secondary | ICD-10-CM | POA: Insufficient documentation

## 2022-10-13 DIAGNOSIS — Z9981 Dependence on supplemental oxygen: Secondary | ICD-10-CM | POA: Insufficient documentation

## 2022-10-13 DIAGNOSIS — T82898A Other specified complication of vascular prosthetic devices, implants and grafts, initial encounter: Secondary | ICD-10-CM | POA: Diagnosis not present

## 2022-10-13 DIAGNOSIS — T8612 Kidney transplant failure: Secondary | ICD-10-CM | POA: Diagnosis not present

## 2022-10-13 DIAGNOSIS — I4719 Other supraventricular tachycardia: Secondary | ICD-10-CM | POA: Insufficient documentation

## 2022-10-13 DIAGNOSIS — F419 Anxiety disorder, unspecified: Secondary | ICD-10-CM | POA: Diagnosis not present

## 2022-10-13 DIAGNOSIS — I083 Combined rheumatic disorders of mitral, aortic and tricuspid valves: Secondary | ICD-10-CM | POA: Diagnosis not present

## 2022-10-13 DIAGNOSIS — Z940 Kidney transplant status: Secondary | ICD-10-CM | POA: Diagnosis not present

## 2022-10-13 DIAGNOSIS — Z992 Dependence on renal dialysis: Secondary | ICD-10-CM | POA: Insufficient documentation

## 2022-10-13 DIAGNOSIS — N186 End stage renal disease: Secondary | ICD-10-CM | POA: Insufficient documentation

## 2022-10-13 DIAGNOSIS — I728 Aneurysm of other specified arteries: Secondary | ICD-10-CM | POA: Diagnosis not present

## 2022-10-13 DIAGNOSIS — I252 Old myocardial infarction: Secondary | ICD-10-CM | POA: Insufficient documentation

## 2022-10-13 DIAGNOSIS — E785 Hyperlipidemia, unspecified: Secondary | ICD-10-CM | POA: Insufficient documentation

## 2022-10-13 DIAGNOSIS — Z87891 Personal history of nicotine dependence: Secondary | ICD-10-CM | POA: Insufficient documentation

## 2022-10-13 DIAGNOSIS — F32A Depression, unspecified: Secondary | ICD-10-CM | POA: Diagnosis not present

## 2022-10-13 DIAGNOSIS — I132 Hypertensive heart and chronic kidney disease with heart failure and with stage 5 chronic kidney disease, or end stage renal disease: Secondary | ICD-10-CM | POA: Insufficient documentation

## 2022-10-13 LAB — POCT I-STAT, CHEM 8
BUN: 32 mg/dL — ABNORMAL HIGH (ref 8–23)
Calcium, Ion: 1.17 mmol/L (ref 1.15–1.40)
Chloride: 91 mmol/L — ABNORMAL LOW (ref 98–111)
Creatinine, Ser: 3.3 mg/dL — ABNORMAL HIGH (ref 0.44–1.00)
Glucose, Bld: 83 mg/dL (ref 70–99)
HCT: 39 % (ref 36.0–46.0)
Hemoglobin: 13.3 g/dL (ref 12.0–15.0)
Potassium: 4.6 mmol/L (ref 3.5–5.1)
Sodium: 137 mmol/L (ref 135–145)
TCO2: 36 mmol/L — ABNORMAL HIGH (ref 22–32)

## 2022-10-13 SURGERY — REVISON OF ARTERIOVENOUS FISTULA
Anesthesia: Monitor Anesthesia Care

## 2022-10-13 MED ORDER — FENTANYL CITRATE (PF) 100 MCG/2ML IJ SOLN
INTRAMUSCULAR | Status: AC
  Filled 2022-10-13: qty 2

## 2022-10-13 MED ORDER — ORAL CARE MOUTH RINSE: 15.0000 mL | Freq: Once | OROMUCOSAL | Status: DC

## 2022-10-13 MED ORDER — SODIUM CHLORIDE 0.9 % IV SOLN: INTRAVENOUS | Status: DC

## 2022-10-13 MED ORDER — CHLORHEXIDINE GLUCONATE 4 % EX LIQD: 60.0000 mL | Freq: Once | CUTANEOUS | Status: DC

## 2022-10-13 MED ORDER — HEPARIN 6000 UNIT IRRIGATION SOLUTION
Status: AC
  Filled 2022-10-13: qty 500

## 2022-10-13 MED ORDER — CEFAZOLIN SODIUM-DEXTROSE 2-4 GM/100ML-% IV SOLN
2.0000 g | INTRAVENOUS | Status: DC
  Filled 2022-10-13: qty 100

## 2022-10-13 MED ORDER — LACTATED RINGERS IV SOLN: INTRAVENOUS | Status: DC

## 2022-10-13 MED ORDER — CHLORHEXIDINE GLUCONATE 0.12 % MT SOLN
15.0000 mL | Freq: Once | OROMUCOSAL | Status: DC
  Filled 2022-10-13: qty 15

## 2022-10-13 MED ORDER — ONDANSETRON HCL 4 MG/2ML IJ SOLN
INTRAMUSCULAR | Status: AC
  Administered 2022-10-13: 2 mg via INTRAVENOUS
  Filled 2022-10-13: qty 2

## 2022-10-13 MED ORDER — ONDANSETRON HCL 4 MG/2ML IJ SOLN: 2.0000 mg | Freq: Once | INTRAMUSCULAR | Status: AC

## 2022-10-13 SURGICAL SUPPLY — 33 items
ADH SKN CLS APL DERMABOND .7 (GAUZE/BANDAGES/DRESSINGS) ×1
ARMBAND PINK RESTRICT EXTREMIT (MISCELLANEOUS) ×2 IMPLANT
BAG COUNTER SPONGE SURGICOUNT (BAG) ×2 IMPLANT
BAG SPNG CNTER NS LX DISP (BAG) ×1
CANISTER SUCT 3000ML PPV (MISCELLANEOUS) ×2 IMPLANT
CANNULA VESSEL 3MM 2 BLNT TIP (CANNULA) ×2 IMPLANT
CLIP VESOCCLUDE MED 6/CT (CLIP) ×2 IMPLANT
CLIP VESOCCLUDE SM WIDE 6/CT (CLIP) ×2 IMPLANT
COVER PROBE W GEL 5X96 (DRAPES) IMPLANT
DERMABOND ADVANCED .7 DNX12 (GAUZE/BANDAGES/DRESSINGS) ×2 IMPLANT
ELECT REM PT RETURN 9FT ADLT (ELECTROSURGICAL) ×1
ELECTRODE REM PT RTRN 9FT ADLT (ELECTROSURGICAL) ×2 IMPLANT
GLOVE BIO SURGEON STRL SZ7.5 (GLOVE) ×2 IMPLANT
GLOVE BIOGEL PI IND STRL 8 (GLOVE) ×2 IMPLANT
GLOVE SURG POLY ORTHO LF SZ7.5 (GLOVE) IMPLANT
GLOVE SURG UNDER LTX SZ8 (GLOVE) ×2 IMPLANT
GOWN STRL REUS W/ TWL LRG LVL3 (GOWN DISPOSABLE) ×6 IMPLANT
GOWN STRL REUS W/TWL LRG LVL3 (GOWN DISPOSABLE) ×3
KIT BASIN OR (CUSTOM PROCEDURE TRAY) ×2 IMPLANT
KIT TURNOVER KIT B (KITS) ×2 IMPLANT
NS IRRIG 1000ML POUR BTL (IV SOLUTION) ×2 IMPLANT
PACK CV ACCESS (CUSTOM PROCEDURE TRAY) ×2 IMPLANT
PAD ARMBOARD 7.5X6 YLW CONV (MISCELLANEOUS) ×4 IMPLANT
SLING ARM FOAM STRAP LRG (SOFTGOODS) IMPLANT
SLING ARM FOAM STRAP MED (SOFTGOODS) IMPLANT
SPONGE SURGIFOAM ABS GEL 100 (HEMOSTASIS) IMPLANT
SUT MNCRL AB 4-0 PS2 18 (SUTURE) ×2 IMPLANT
SUT PROLENE 6 0 BV (SUTURE) ×2 IMPLANT
SUT VIC AB 3-0 SH 27 (SUTURE) ×1
SUT VIC AB 3-0 SH 27X BRD (SUTURE) ×2 IMPLANT
TOWEL GREEN STERILE (TOWEL DISPOSABLE) ×2 IMPLANT
UNDERPAD 30X36 HEAVY ABSORB (UNDERPADS AND DIAPERS) ×2 IMPLANT
WATER STERILE IRR 1000ML POUR (IV SOLUTION) ×2 IMPLANT

## 2022-10-13 NOTE — Telephone Encounter (Signed)
Zanette from [facility] called to confirm why pt's surgery was canceled, as pt returned back to facility today. We will proceed with scheduling when it is safe to do so, as MD note states. No questions/concerns at this time.

## 2022-10-13 NOTE — Progress Notes (Signed)
VASCULAR SURGERY:  The patient presented for excision of her left upper arm fistula today.  She is short of breath and somewhat confused.  After extensive discussion with the family and Dr. Annye Asa we have elected to cancel her surgery.  We will try to reschedule this when it is safer.  Gae Gallop, MD 10:48 AM

## 2022-10-13 NOTE — Interval H&P Note (Signed)
History and Physical Interval Note:  10/13/2022 10:31 AM  [name]  has presented today for surgery, with the diagnosis of ESRD.  The various methods of treatment have been discussed with the patient and family. After consideration of risks, benefits and other options for treatment, the patient has consented to  Procedure(s): EXCISION OF LEFT ARTERIOVENOUS FISTULA (Left) as a surgical intervention.  The patient's history has been reviewed, patient examined, no change in status, stable for surgery.  I have reviewed the patient's chart and labs.  Questions were answered to the patient's satisfaction.     [name]

## 2022-10-13 NOTE — Progress Notes (Signed)
Patient no longer wants to have surgery. Family at bedside and is in agreement with plan. Facility called at [phone] to pick up patient. IV removed and site is clean, dry and intact.

## 2022-10-14 DIAGNOSIS — N186 End stage renal disease: Secondary | ICD-10-CM | POA: Diagnosis not present

## 2022-10-14 DIAGNOSIS — N2581 Secondary hyperparathyroidism of renal origin: Secondary | ICD-10-CM | POA: Diagnosis not present

## 2022-10-14 DIAGNOSIS — D631 Anemia in chronic kidney disease: Secondary | ICD-10-CM | POA: Diagnosis not present

## 2022-10-14 DIAGNOSIS — Z992 Dependence on renal dialysis: Secondary | ICD-10-CM | POA: Diagnosis not present

## 2022-10-14 DIAGNOSIS — T8612 Kidney transplant failure: Secondary | ICD-10-CM | POA: Diagnosis not present

## 2022-10-16 DIAGNOSIS — T827XXD Infection and inflammatory reaction due to other cardiac and vascular devices, implants and grafts, subsequent encounter: Secondary | ICD-10-CM | POA: Diagnosis not present

## 2022-10-16 DIAGNOSIS — Z992 Dependence on renal dialysis: Secondary | ICD-10-CM | POA: Diagnosis not present

## 2022-10-16 DIAGNOSIS — I959 Hypotension, unspecified: Secondary | ICD-10-CM | POA: Diagnosis not present

## 2022-10-16 DIAGNOSIS — N2581 Secondary hyperparathyroidism of renal origin: Secondary | ICD-10-CM | POA: Diagnosis not present

## 2022-10-16 DIAGNOSIS — N186 End stage renal disease: Secondary | ICD-10-CM | POA: Diagnosis not present

## 2022-10-16 DIAGNOSIS — D631 Anemia in chronic kidney disease: Secondary | ICD-10-CM | POA: Diagnosis not present

## 2022-10-19 ENCOUNTER — Emergency Department (HOSPITAL_COMMUNITY): Payer: Medicare Other

## 2022-10-19 ENCOUNTER — Other Ambulatory Visit: Payer: Self-pay

## 2022-10-19 ENCOUNTER — Inpatient Hospital Stay (HOSPITAL_COMMUNITY)
Admission: EM | Admit: 2022-10-19 | Discharge: 2022-10-27 | DRG: 070 | Disposition: A | Discharge status: Skilled Nursing Facility | Payer: Medicare Other | Source: Skilled Nursing Facility | Attending: Internal Medicine | Admitting: Internal Medicine

## 2022-10-19 ENCOUNTER — Inpatient Hospital Stay (HOSPITAL_COMMUNITY): Payer: Medicare Other

## 2022-10-19 DIAGNOSIS — F419 Anxiety disorder, unspecified: Secondary | ICD-10-CM | POA: Diagnosis present

## 2022-10-19 DIAGNOSIS — R946 Abnormal results of thyroid function studies: Secondary | ICD-10-CM | POA: Diagnosis present

## 2022-10-19 DIAGNOSIS — R4781 Slurred speech: Secondary | ICD-10-CM | POA: Diagnosis not present

## 2022-10-19 DIAGNOSIS — I252 Old myocardial infarction: Secondary | ICD-10-CM

## 2022-10-19 DIAGNOSIS — D472 Monoclonal gammopathy: Secondary | ICD-10-CM | POA: Diagnosis present

## 2022-10-19 DIAGNOSIS — I132 Hypertensive heart and chronic kidney disease with heart failure and with stage 5 chronic kidney disease, or end stage renal disease: Secondary | ICD-10-CM | POA: Diagnosis present

## 2022-10-19 DIAGNOSIS — F0393 Unspecified dementia, unspecified severity, with mood disturbance: Secondary | ICD-10-CM | POA: Diagnosis present

## 2022-10-19 DIAGNOSIS — I5033 Acute on chronic diastolic (congestive) heart failure: Secondary | ICD-10-CM | POA: Diagnosis not present

## 2022-10-19 DIAGNOSIS — Z9049 Acquired absence of other specified parts of digestive tract: Secondary | ICD-10-CM

## 2022-10-19 DIAGNOSIS — R404 Transient alteration of awareness: Secondary | ICD-10-CM | POA: Diagnosis not present

## 2022-10-19 DIAGNOSIS — Z882 Allergy status to sulfonamides status: Secondary | ICD-10-CM

## 2022-10-19 DIAGNOSIS — L89522 Pressure ulcer of left ankle, stage 2: Secondary | ICD-10-CM | POA: Diagnosis present

## 2022-10-19 DIAGNOSIS — I130 Hypertensive heart and chronic kidney disease with heart failure and stage 1 through stage 4 chronic kidney disease, or unspecified chronic kidney disease: Secondary | ICD-10-CM | POA: Diagnosis not present

## 2022-10-19 DIAGNOSIS — G9340 Encephalopathy, unspecified: Secondary | ICD-10-CM | POA: Diagnosis not present

## 2022-10-19 DIAGNOSIS — Z7189 Other specified counseling: Secondary | ICD-10-CM | POA: Diagnosis not present

## 2022-10-19 DIAGNOSIS — E162 Hypoglycemia, unspecified: Secondary | ICD-10-CM | POA: Diagnosis present

## 2022-10-19 DIAGNOSIS — I10 Essential (primary) hypertension: Secondary | ICD-10-CM | POA: Diagnosis not present

## 2022-10-19 DIAGNOSIS — Z86010 Personal history of colonic polyps: Secondary | ICD-10-CM

## 2022-10-19 DIAGNOSIS — D631 Anemia in chronic kidney disease: Secondary | ICD-10-CM | POA: Diagnosis present

## 2022-10-19 DIAGNOSIS — Z66 Do not resuscitate: Secondary | ICD-10-CM | POA: Diagnosis not present

## 2022-10-19 DIAGNOSIS — N186 End stage renal disease: Secondary | ICD-10-CM | POA: Diagnosis not present

## 2022-10-19 DIAGNOSIS — R1312 Dysphagia, oropharyngeal phase: Secondary | ICD-10-CM | POA: Diagnosis not present

## 2022-10-19 DIAGNOSIS — R4701 Aphasia: Secondary | ICD-10-CM | POA: Diagnosis not present

## 2022-10-19 DIAGNOSIS — Z515 Encounter for palliative care: Secondary | ICD-10-CM | POA: Diagnosis not present

## 2022-10-19 DIAGNOSIS — M810 Age-related osteoporosis without current pathological fracture: Secondary | ICD-10-CM | POA: Diagnosis present

## 2022-10-19 DIAGNOSIS — Z8616 Personal history of COVID-19: Secondary | ICD-10-CM

## 2022-10-19 DIAGNOSIS — E785 Hyperlipidemia, unspecified: Secondary | ICD-10-CM | POA: Diagnosis present

## 2022-10-19 DIAGNOSIS — G629 Polyneuropathy, unspecified: Secondary | ICD-10-CM | POA: Diagnosis present

## 2022-10-19 DIAGNOSIS — Z83438 Family history of other disorder of lipoprotein metabolism and other lipidemia: Secondary | ICD-10-CM

## 2022-10-19 DIAGNOSIS — Z992 Dependence on renal dialysis: Secondary | ICD-10-CM | POA: Diagnosis not present

## 2022-10-19 DIAGNOSIS — Z87891 Personal history of nicotine dependence: Secondary | ICD-10-CM | POA: Diagnosis not present

## 2022-10-19 DIAGNOSIS — Z981 Arthrodesis status: Secondary | ICD-10-CM

## 2022-10-19 DIAGNOSIS — Z8679 Personal history of other diseases of the circulatory system: Secondary | ICD-10-CM

## 2022-10-19 DIAGNOSIS — N059 Unspecified nephritic syndrome with unspecified morphologic changes: Secondary | ICD-10-CM | POA: Diagnosis present

## 2022-10-19 DIAGNOSIS — Z940 Kidney transplant status: Secondary | ICD-10-CM

## 2022-10-19 DIAGNOSIS — I951 Orthostatic hypotension: Secondary | ICD-10-CM | POA: Diagnosis present

## 2022-10-19 DIAGNOSIS — Z8249 Family history of ischemic heart disease and other diseases of the circulatory system: Secondary | ICD-10-CM

## 2022-10-19 DIAGNOSIS — R4182 Altered mental status, unspecified: Secondary | ICD-10-CM | POA: Diagnosis not present

## 2022-10-19 DIAGNOSIS — I509 Heart failure, unspecified: Secondary | ICD-10-CM | POA: Diagnosis not present

## 2022-10-19 DIAGNOSIS — I2510 Atherosclerotic heart disease of native coronary artery without angina pectoris: Secondary | ICD-10-CM | POA: Diagnosis present

## 2022-10-19 DIAGNOSIS — Z8673 Personal history of transient ischemic attack (TIA), and cerebral infarction without residual deficits: Secondary | ICD-10-CM

## 2022-10-19 DIAGNOSIS — I69354 Hemiplegia and hemiparesis following cerebral infarction affecting left non-dominant side: Secondary | ICD-10-CM | POA: Diagnosis not present

## 2022-10-19 DIAGNOSIS — Z681 Body mass index (BMI) 19 or less, adult: Secondary | ICD-10-CM | POA: Diagnosis not present

## 2022-10-19 DIAGNOSIS — R569 Unspecified convulsions: Secondary | ICD-10-CM | POA: Diagnosis present

## 2022-10-19 DIAGNOSIS — G9341 Metabolic encephalopathy: Secondary | ICD-10-CM | POA: Diagnosis not present

## 2022-10-19 DIAGNOSIS — N2581 Secondary hyperparathyroidism of renal origin: Secondary | ICD-10-CM | POA: Diagnosis present

## 2022-10-19 DIAGNOSIS — L8990 Pressure ulcer of unspecified site, unspecified stage: Secondary | ICD-10-CM | POA: Insufficient documentation

## 2022-10-19 DIAGNOSIS — R627 Adult failure to thrive: Secondary | ICD-10-CM | POA: Diagnosis not present

## 2022-10-19 DIAGNOSIS — E8889 Other specified metabolic disorders: Secondary | ICD-10-CM | POA: Diagnosis present

## 2022-10-19 DIAGNOSIS — K219 Gastro-esophageal reflux disease without esophagitis: Secondary | ICD-10-CM | POA: Diagnosis present

## 2022-10-19 DIAGNOSIS — R400 Somnolence: Secondary | ICD-10-CM

## 2022-10-19 DIAGNOSIS — R410 Disorientation, unspecified: Secondary | ICD-10-CM | POA: Diagnosis not present

## 2022-10-19 DIAGNOSIS — Z823 Family history of stroke: Secondary | ICD-10-CM

## 2022-10-19 DIAGNOSIS — I480 Paroxysmal atrial fibrillation: Secondary | ICD-10-CM | POA: Diagnosis present

## 2022-10-19 DIAGNOSIS — M1990 Unspecified osteoarthritis, unspecified site: Secondary | ICD-10-CM | POA: Diagnosis present

## 2022-10-19 DIAGNOSIS — E43 Unspecified severe protein-calorie malnutrition: Secondary | ICD-10-CM | POA: Diagnosis not present

## 2022-10-19 DIAGNOSIS — I959 Hypotension, unspecified: Secondary | ICD-10-CM | POA: Diagnosis present

## 2022-10-19 DIAGNOSIS — Z9109 Other allergy status, other than to drugs and biological substances: Secondary | ICD-10-CM

## 2022-10-19 DIAGNOSIS — Z79899 Other long term (current) drug therapy: Secondary | ICD-10-CM

## 2022-10-19 DIAGNOSIS — Z7902 Long term (current) use of antithrombotics/antiplatelets: Secondary | ICD-10-CM

## 2022-10-19 LAB — COMPREHENSIVE METABOLIC PANEL
ALT: 12 U/L (ref 0–44)
AST: 31 U/L (ref 15–41)
Albumin: 3.2 g/dL — ABNORMAL LOW (ref 3.5–5.0)
Alkaline Phosphatase: 104 U/L (ref 38–126)
Anion gap: 28 — ABNORMAL HIGH (ref 5–15)
BUN: 58 mg/dL — ABNORMAL HIGH (ref 8–23)
CO2: 20 mmol/L — ABNORMAL LOW (ref 22–32)
Calcium: 11.2 mg/dL — ABNORMAL HIGH (ref 8.9–10.3)
Chloride: 94 mmol/L — ABNORMAL LOW (ref 98–111)
Creatinine, Ser: 5.97 mg/dL — ABNORMAL HIGH (ref 0.44–1.00)
GFR, Estimated: 7 mL/min — ABNORMAL LOW (ref >60)
Glucose, Bld: 191 mg/dL — ABNORMAL HIGH (ref 70–99)
Potassium: 5.4 mmol/L — ABNORMAL HIGH (ref 3.5–5.1)
Sodium: 142 mmol/L (ref 135–145)
Total Bilirubin: 1.4 mg/dL — ABNORMAL HIGH (ref 0.3–1.2)
Total Protein: 7 g/dL (ref 6.5–8.1)

## 2022-10-19 LAB — I-STAT VENOUS BLOOD GAS, ED
Acid-Base Excess: 6 mmol/L — ABNORMAL HIGH (ref 0.0–2.0)
Bicarbonate: 27.1 mmol/L (ref 20.0–28.0)
Calcium, Ion: 1 mmol/L — ABNORMAL LOW (ref 1.15–1.40)
HCT: 34 % — ABNORMAL LOW (ref 36.0–46.0)
Hemoglobin: 11.6 g/dL — ABNORMAL LOW (ref 12.0–15.0)
O2 Saturation: 99 %
Potassium: 4.9 mmol/L (ref 3.5–5.1)
Sodium: 135 mmol/L (ref 135–145)
TCO2: 28 mmol/L (ref 22–32)
pCO2, Ven: 28.2 mmHg — ABNORMAL LOW (ref 44–60)
pH, Ven: 7.59 — ABNORMAL HIGH (ref 7.25–7.43)
pO2, Ven: 129 mmHg — ABNORMAL HIGH (ref 32–45)

## 2022-10-19 LAB — CBC WITH DIFFERENTIAL/PLATELET
Abs Immature Granulocytes: 0.08 10*3/uL — ABNORMAL HIGH (ref 0.00–0.07)
Basophils Absolute: 0 10*3/uL (ref 0.0–0.1)
Basophils Relative: 0 %
Eosinophils Absolute: 0 10*3/uL (ref 0.0–0.5)
Eosinophils Relative: 0 %
HCT: 38.9 % (ref 36.0–46.0)
Hemoglobin: 11.9 g/dL — ABNORMAL LOW (ref 12.0–15.0)
Immature Granulocytes: 1 %
Lymphocytes Relative: 22 %
Lymphs Abs: 2.2 10*3/uL (ref 0.7–4.0)
MCH: 32.5 pg (ref 26.0–34.0)
MCHC: 30.6 g/dL (ref 30.0–36.0)
MCV: 106.3 fL — ABNORMAL HIGH (ref 80.0–100.0)
Monocytes Absolute: 0.8 10*3/uL (ref 0.1–1.0)
Monocytes Relative: 8 %
Neutro Abs: 7 10*3/uL (ref 1.7–7.7)
Neutrophils Relative %: 69 %
Platelets: 317 10*3/uL (ref 150–400)
RBC: 3.66 MIL/uL — ABNORMAL LOW (ref 3.87–5.11)
RDW: 18.7 % — ABNORMAL HIGH (ref 11.5–15.5)
WBC: 10 10*3/uL (ref 4.0–10.5)
nRBC: 0.3 % — ABNORMAL HIGH (ref 0.0–0.2)

## 2022-10-19 LAB — LIPASE, BLOOD: Lipase: 44 U/L (ref 11–51)

## 2022-10-19 LAB — HEPATITIS B SURFACE ANTIGEN: Hepatitis B Surface Ag: NONREACTIVE

## 2022-10-19 LAB — MAGNESIUM: Magnesium: 2.7 mg/dL — ABNORMAL HIGH (ref 1.7–2.4)

## 2022-10-19 LAB — TSH: TSH: 5.044 u[IU]/mL — ABNORMAL HIGH (ref 0.350–4.500)

## 2022-10-19 LAB — CBG MONITORING, ED: Glucose-Capillary: 165 mg/dL — ABNORMAL HIGH (ref 70–99)

## 2022-10-19 LAB — AMMONIA: Ammonia: 32 umol/L (ref 9–35)

## 2022-10-19 MED ORDER — CINACALCET HCL 30 MG PO TABS
30.0000 mg | ORAL_TABLET | ORAL | Status: DC
  Administered 2022-10-23: 30 mg via ORAL
  Filled 2022-10-19 (×3): qty 1

## 2022-10-19 MED ORDER — CHLORHEXIDINE GLUCONATE CLOTH 2 % EX PADS
6.0000 | MEDICATED_PAD | Freq: Every day | CUTANEOUS | Status: DC
  Administered 2022-10-23 – 2022-10-25 (×3): 6 via TOPICAL

## 2022-10-19 MED ORDER — LORAZEPAM 2 MG/ML IJ SOLN
1.0000 mg | Freq: Once | INTRAMUSCULAR | Status: AC
  Administered 2022-10-19: 1 mg via INTRAVENOUS
  Filled 2022-10-19: qty 1

## 2022-10-19 MED ORDER — MIDAZOLAM HCL (PF) 5 MG/ML IJ SOLN
INTRAMUSCULAR | Status: AC
  Filled 2022-10-19: qty 1

## 2022-10-19 MED ORDER — LEVETIRACETAM IN NACL 1000 MG/100ML IV SOLN
1000.0000 mg | Freq: Once | INTRAVENOUS | Status: AC
  Administered 2022-10-19: 1000 mg via INTRAVENOUS
  Filled 2022-10-19: qty 100

## 2022-10-19 MED ORDER — ENSURE ENLIVE PO LIQD
237.0000 mL | Freq: Two times a day (BID) | ORAL | Status: DC
  Filled 2022-10-19: qty 237

## 2022-10-19 NOTE — ED Triage Notes (Signed)
Pt bib ems from [facility] with AMS. Unknown LKW. Pt confused, slurred speech and R sided gaze. Pt not following commands, not crossing midline. CBG 200's, 106 palpated, HR 106, 98% RA.

## 2022-10-19 NOTE — Progress Notes (Signed)
Patient is not available for EEG, remains OTF.

## 2022-10-19 NOTE — H&P (Addendum)
History and Physical    [name] SJG:[id number] DOB: 06-Jun-1951 DOA: 10/19/2022  PCP: [name], MD (Confirm with patient/family/NH records and if not entered, this has to be entered at [facility] point of entry) Patient coming from: SNF  I have personally briefly reviewed patient's old medical records in [place]  Chief Complaint: Altered mentation  HPI: [name] is a 71 y.o. 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  Patient unable to provide any history, history provided by patient's son at bedside.  Patient resides at nursing home, and this morning staff noticed patient became confused unresponsive slurred speech and right-sided gaze, and called EMS.  CBG 200s, HR 106, O2 saturation 98% on room air.  Last dialysis was last Friday.  No recent medication changes.  Son reported the patient has chronic sleep hygiene usual but no history of sundowning syndrome or diagnosed dementia.  ED Course: In the ED, patient developed an episode of foaming mouth and drooling and unable to talk, lasted about 1 minute associated with hypoxia and tachycardia.  Also had some reported twitching like movement of bilateral arms, 1 dose of Ativan push given and patient was also loaded with Keppra.  Head negative for acute findings, kidney function BUN and creatinine above baseline, calcium 11.2, corrected calcium 11.4.  WBC 10.0  Review of Systems: Unable to perform, patient has after mentations.  Past Medical History:  Diagnosis Date   Acute ischemic stroke ([place]) 02/23/2017   Acute on chronic diastolic CHF (congestive heart failure) ([place])    Adenomatous polyp of colon 10/2010, 2006, 2015   Anemia in CKD (chronic kidney disease) 11/07/2012   s/p blood  transfusion.    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  Had some early rejection then stable function for years, then had slow decline of function and went back on hemodialysis in 2012.  Gets HD TTS schedule at [facility] on [facility] still using L forearm AVF.   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    Past Surgical History:  Procedure Laterality Date   ABDOMINAL AORTOGRAM W/LOWER EXTREMITY N/A 03/26/2022   Procedure: ABDOMINAL AORTOGRAM W/LOWER EXTREMITY;  Surgeon: Marty Heck, MD;  Location: [place] [facility];  Service: Cardiovascular;  Laterality: N/A;  Left   AV FISTULA PLACEMENT     for dialysis   AV FISTULA PLACEMENT Left 11/22/2015   Procedure: ARTERIOVENOUS (AV) FISTULA CREATION-LEFT BRACHIOCEPHALIC;  Surgeon: Serafina Mitchell, MD;  Location: [facility];  Service: Vascular;  Laterality: Left;   AV FISTULA PLACEMENT Right 03/15/2020   Procedure: INSERTION OF ARTERIOVENOUS (AV) GORE-TEX GRAFT ARM ( BRACHIAL AXILLARY );  Surgeon:  Schnier, Dolores Lory, MD;  Location: [facility];  Service: Vascular;  Laterality: Right;   AV FISTULA PLACEMENT Right 05/15/2022   Procedure: EXPLORATION  OF  RIGHT ARM ARTERIOVENOUS GORE-TEX GRAFT;  Surgeon: Angelia Mould, MD;  Location: [place];  Service: Vascular;  Laterality: Right;   [place] REMOVAL Right 04/27/2022   Procedure: REVISION OF RIGHT ARTERIOVENOUS GRAFT AND EXCISION OF  RIGHT DEGENERATED GRAFT (AVG);  Surgeon: Angelia Mould, MD;  Location: [place];  Service: Vascular;  Laterality: Right;   BACK SURGERY     CERVICAL FUSION     CHOLECYSTECTOMY  12/02/2012   Procedure: LAPAROSCOPIC CHOLECYSTECTOMY WITH INTRAOPERATIVE CHOLANGIOGRAM;  Surgeon: Edward Jolly, MD;  Location: [place];  Service: General;  Laterality: N/A;   EYE SURGERY Bilateral    cataract surgery   EYE SURGERY Left 2019   laser   HEMATOMA EVACUATION Left 12/24/2016   Procedure: EVACUATION HEMATOMA LEFT UPPER ARM;  Surgeon: Waynetta Sandy, MD;  Location: [place];  Service: Vascular;  Laterality: Left;   I & D EXTREMITY Left 12/31/2016   Procedure: IRRIGATION AND DEBRIDEMENT EXTREMITY;  Surgeon: Angelia Mould, MD;  Location: [place];  Service: Vascular;  Laterality: Left;   INSERTION OF DIALYSIS CATHETER Right 12/24/2016   Procedure: INSERTION OF DIALYSIS CATHETER;  Surgeon: Waynetta Sandy, MD;  Location: [place];  Service: Vascular;  Laterality: Right;   INSERTION OF DIALYSIS CATHETER Right 02/04/2017   Procedure: INSERTION OF DIALYSIS CATHETER;  Surgeon: Waynetta Sandy, MD;  Location: [place];  Service: Vascular;  Laterality: Right;   [place] Left 10/23/2016   Procedure: Fistulagram;  Surgeon: Elam Dutch, MD;  Location: [place] [facility];  Service: Cardiovascular;  Laterality: Left;   PERIPHERAL VASCULAR INTERVENTION  03/26/2022   Procedure: PERIPHERAL VASCULAR INTERVENTION;  Surgeon: Marty Heck, MD;  Location: [place];  Service: Cardiovascular;;  left sfa   PERIPHERAL VASCULAR THROMBECTOMY Right 04/16/2020   Procedure: PERIPHERAL VASCULAR THROMBECTOMY;  Surgeon: Katha Cabal, MD;  Location: [place] [facility];  Service: Cardiovascular;  Laterality: Right;   RESECTION OF ARTERIOVENOUS FISTULA ANEURYSM Left 11/22/2015   Procedure: RESECTION OF LEFT RADIOCEPHALIC FISTULA  ANEURYSM ;  Surgeon: Serafina Mitchell, MD;  Location: [place] [facility];  Service: Vascular;  Laterality: Left;   REVISON OF ARTERIOVENOUS FISTULA Left 12/22/2016   Procedure: REVISON OF LEFT ARTERIOVENOUS FISTULA;  Surgeon: Waynetta Sandy, MD;  Location: [place];  Service: Vascular;  Laterality: Left;   REVISON OF ARTERIOVENOUS FISTULA Left 02/04/2017   Procedure: REVISON OF LEFT UPPER ARM ARTERIOVENOUS FISTULA;  Surgeon: Waynetta Sandy, MD;  Location: [place];  Service: Vascular;  Laterality: Left;   UPPER EXTREMITY ANGIOGRAPHY Bilateral 09/19/2019   Procedure: UPPER EXTREMITY ANGIOGRAPHY;  Surgeon: Katha Cabal, MD;  Location: [place] [facility];  Service: Cardiovascular;  Laterality: Bilateral;     reports that she quit smoking about 30 years ago. Her smoking use included cigarettes. She has never been exposed to tobacco smoke. She has never used smokeless tobacco. She reports that she does not drink alcohol and does not use drugs.  Allergies  Allergen Reactions   Sulfa Antibiotics Other (See Comments)    Both pt's parents allergic to this type of medication    Adhesive [Tape] Itching    Family History  Problem Relation Age of Onset   Colon cancer Brother    Cancer Brother    Coronary  artery disease Mother 49   Hyperlipidemia Mother    Hypertension Mother    Stroke Maternal Aunt    Esophageal cancer Neg Hx    Stomach cancer Neg Hx    Rectal cancer Neg Hx      Prior to Admission medications   Medication Sig Start Date End Date Taking? Authorizing Provider  albuterol (PROVENTIL) (2.5 MG/3ML) 0.083% nebulizer solution Take 3 mLs (2.5 mg total) by nebulization every 4 (four) hours as needed for wheezing. 05/04/22   Elgergawy, Silver Huguenin, MD  albuterol (VENTOLIN HFA) 108 (90 Base) MCG/ACT inhaler Inhale 1-2 puffs into the lungs every 6 (six) hours as needed for wheezing or shortness of breath. 01/11/22   Mesner, Corene Cornea, MD  atorvastatin (LIPITOR) 80 MG tablet Take 80 mg by mouth  at bedtime. 03/25/22   [provider]  B Complex-C-Folic Acid (DIALYVITE 102) 0.8 MG WAFR Take 1 tablet by mouth every morning. 01/28/22   [provider]  benzonatate (TESSALON) 200 MG capsule Take 1 capsule (200 mg total) by mouth 2 (two) times daily as needed for cough. 09/15/22   Nita Sells, MD  clopidogrel (PLAVIX) 75 MG tablet Take 1 tablet (75 mg total) by mouth daily. 03/26/22 03/26/23  Marty Heck, MD  Dextromethorphan-guaiFENesin 20-400 MG TABS Take 1 tablet by mouth in the morning and at bedtime.    [provider]  doxycycline (VIBRA-TABS) 100 MG tablet Take 1 tablet (100 mg total) by mouth 2 (two) times daily. 10/01/22   Laurice Record, MD  escitalopram (LEXAPRO) 10 MG tablet Take 1 tablet (10 mg total) by mouth every morning. 09/15/22   Nita Sells, MD  hydrocortisone (ANUSOL-HC) 2.5 % rectal cream Apply 1 Application topically 4 (four) times daily as needed for hemorrhoids. Patient taking differently: Apply 1 Application topically every 6 (six) hours as needed for hemorrhoids. 09/17/22   Rai, Vernelle Emerald, MD  hydrOXYzine (ATARAX) 10 MG tablet Take 1 tablet (10 mg total) by mouth 3 (three) times daily as needed for itching. 09/15/22   Nita Sells, MD  loperamide (IMODIUM) 2 MG capsule Take 2 mg by mouth every 4 (four) hours as needed for diarrhea or loose stools.    [provider]  midodrine (PROAMATINE) 10 MG tablet Take 1 tablet (10 mg total) by mouth 3 (three) times daily with meals. 05/21/22   Mercy Riding, MD  pantoprazole (PROTONIX) 40 MG tablet Take 1 tablet (40 mg total) by mouth daily. 08/18/21   Nita Sells, MD  sevelamer carbonate (RENVELA) 800 MG tablet Take 1 tablet (800 mg total) by mouth 3 (three) times daily with meals. 09/15/22   Nita Sells, MD    Physical Exam: Vitals:   10/19/22 0930 10/19/22 0933 10/19/22 0953  BP: 97/68    Pulse:  86   Resp:  20   Temp:   98 F (36.7 C)   TempSrc:   Rectal  SpO2:  96%     Constitutional: NAD, calm, comfortable Vitals:   10/19/22 0930 10/19/22 0933 10/19/22 0953  BP: 97/68    Pulse:  86   Resp:  20   Temp:   98 F (36.7 C)  TempSrc:   Rectal  SpO2:  96%    Eyes: PERRL, lids and conjunctivae normal ENMT: Mucous membranes are moist. Posterior pharynx clear of any exudate or lesions.Normal dentition.  Neck: normal, supple, no masses, no thyromegaly Respiratory: clear to auscultation bilaterally, no wheezing, no crackles. Normal respiratory effort. No accessory muscle use.  Cardiovascular: Regular rate and rhythm, no murmurs / rubs / gallops. No extremity edema. 2+ pedal pulses. No carotid bruits.  Abdomen: no tenderness, no masses palpated. No hepatosplenomegaly. Bowel sounds positive.  Musculoskeletal: no clubbing / cyanosis. No joint deformity upper and lower extremities. Good ROM, no contractures. Normal muscle tone.  Skin: no rashes, lesions, ulcers. No induration Neurologic: No clear facial droop, moving all limbs, not following commands Psychiatric: Sedated/sleeping, responding to painful stimuli   Labs on Admission: I have personally reviewed following labs and imaging studies  CBC: Recent Labs  Lab 10/13/22 0901 10/19/22 1005  WBC  --  10.0  NEUTROABS  --  7.0  HGB 13.3 11.9*  HCT 39.0 38.9  MCV  --  106.3*  PLT  --  400   Basic Metabolic Panel: Recent Labs  Lab 10/13/22 0901 10/19/22 1005  NA 137 142  K 4.6 5.4*  CL 91* 94*  CO2  --  20*  GLUCOSE 83 191*  BUN 32* 58*  CREATININE 3.30* 5.97*  CALCIUM  --  11.2*  MG  --  2.7*   GFR: Estimated Creatinine Clearance: 6.7 mL/min (A) (by C-G formula based on SCr of 5.97 mg/dL (H)). Liver Function Tests: Recent Labs  Lab 10/19/22 1005  AST 31  ALT 12  ALKPHOS 104  BILITOT 1.4*  PROT 7.0  ALBUMIN 3.2*   Recent Labs  Lab 10/19/22 1005  LIPASE 44   No results for input(s): "AMMONIA" in the last 168 hours. Coagulation Profile: No  results for input(s): "INR", "PROTIME" in the last 168 hours. Cardiac Enzymes: No results for input(s): "CKTOTAL", "CKMB", "CKMBINDEX", "TROPONINI" in the last 168 hours. BNP (last 3 results) No results for input(s): "PROBNP" in the last 8760 hours. HbA1C: No results for input(s): "HGBA1C" in the last 72 hours. CBG: Recent Labs  Lab 10/19/22 0939  GLUCAP 165*   Lipid Profile: No results for input(s): "CHOL", "HDL", "LDLCALC", "TRIG", "CHOLHDL", "LDLDIRECT" in the last 72 hours. Thyroid Function Tests: Recent Labs    10/19/22 1005  TSH 5.044*   Anemia Panel: No results for input(s): "VITAMINB12", "FOLATE", "FERRITIN", "TIBC", "IRON", "RETICCTPCT" in the last 72 hours. Urine analysis:    Component Value Date/Time   COLORURINE YELLOW 01/30/2011 1648   APPEARANCEUR CLOUDY (A) 01/30/2011 1648   LABSPEC 1.016 01/30/2011 1648   PHURINE 5.0 01/30/2011 1648   GLUCOSEU NEGATIVE 10/14/2009 0747   HGBUR SMALL (A) 01/30/2011 1648   BILIRUBINUR NEGATIVE 01/30/2011 1648   KETONESUR NEGATIVE 01/30/2011 1648   PROTEINUR 100 (A) 01/30/2011 1648   UROBILINOGEN 0.2 01/30/2011 1648   NITRITE NEGATIVE 01/30/2011 1648   LEUKOCYTESUR SMALL (A) 01/30/2011 1648    Radiological Exams on Admission: DG Chest Portable 1 View  Result Date: 10/19/2022 CLINICAL DATA:  Altered mental status EXAM: PORTABLE CHEST 1 VIEW COMPARISON:  Chest x-ray September 10, 2022 FINDINGS: Unchanged cardiomediastinal contours including cardiomegaly and tortuous thoracic aorta. Calcified atherosclerosis. Bilateral costophrenic angles are excluded from the field of view, limiting evaluation of the lungs. Bilateral mild reticular pulmonary opacities. No large pneumothorax or pleural effusion. No acute osseous abnormality. The visualized upper abdomen is unremarkable. IMPRESSION: 1. Bilateral mild reticular pulmonary opacities, favored to represent mild pulmonary edema. 2. Unchanged cardiomegaly. Electronically Signed   By:  Beryle Flock M.D.   On: 10/19/2022 11:17   CT Head Wo Contrast  Result Date: 10/19/2022 CLINICAL DATA:  Mental status change of unknown cause. Confusion. Slurred speech. Right gaze. EXAM: CT HEAD WITHOUT CONTRAST TECHNIQUE: Contiguous  axial images were obtained from the base of the skull through the vertex without intravenous contrast. RADIATION DOSE REDUCTION: This exam was performed according to the departmental dose-optimization program which includes automated exposure control, adjustment of the mA and/or kV according to patient size and/or use of iterative reconstruction technique. COMPARISON:  06/22/2022 FINDINGS: Brain: Advanced generalized atrophy. Extensive chronic small-vessel ischemic changes throughout the white matter. No sign of acute infarction, mass lesion, hemorrhage, hydrocephalus or extra-axial collection. No change since the prior examination. Vascular: There is atherosclerotic calcification of the major vessels at the base of the brain. Skull: Negative Sinuses/Orbits: Clear/normal Other: None IMPRESSION: No acute CT finding. Advanced generalized atrophy. Extensive chronic small-vessel ischemic changes throughout the white matter. Electronically Signed   By: Nelson Chimes M.D.   On: 10/19/2022 10:37    EKG: Independently reviewed.  Sinus tachycardia, borderline prolonged QTc  Assessment/Plan Active Problems:   Seizure ([place])  (please populate well all problems here in Problem List. (For example, if patient is on BP meds at home and you resume or decide to hold them, it is a problem that needs to be her. Same for CAD, COPD, HLD and so on)  Acute metabolic encephalopathy, GCS=9 -Postictal versus still under sedation effect of Ativan given earlier 1 hour ago. -Seizure precaution, aspiration precaution -CT head reassuring -Consider MRI once patient mentation stabilized. -Other Ddx, initial presentation also has element of strokelike symptoms including dysarthria and one-sided  neglect, currently is hard to evaluate.  Consider MRI when patient is more awake.  Neurology consultation appreciated.  Will also check VBG and ammonia level.  Seizure work-up as per neurology. Her Ca level elevated, corrected calcium level 11.4, but doubt this much elevation of calcium will cause her significant mentation changes, and she is to receive HD this afternoon.  Acute on chronic HFpEF decompensation -X-ray showed bilateral pulmonary congestion, nephrology on board and emergency dialysis this afternoon. -Other DDx, no significant cough or hypoxia or fever, low suspicion for aspiration pneumonia at this point.  Hold off antibiotics.  Question of stroke -As above -Frequent neurochecks until mentation recovers to baseline. -Continue Plavix  HTN and orthostatic hypotension -Blood pressure medication on hold due to hypotension, continue midodrine  ESRD on HD -HD this afternoon.  Chronic anemia secondary to CKD -Stable H/H  Moderate protein calorie malnutrition -BMI=20, son reported that patient has normal diet but continued to have weight loss.  Will start Ensure and consult dietitian.   DVT prophylaxis: Heparin subQ Code Status: Full code Family Communication: Son at bedside Disposition Plan: Patient sick with acute mentation changes with unclear etiology, requiring inpatient work-up for new onset seizure versus stroke, expect more than 2 midnight hospital stay. Consults called: Neuro Admission status: Tele admit   Lequita Halt MD [facility] Pager [phone]  10/19/2022, 12:54 PM

## 2022-10-19 NOTE — ED Notes (Signed)
Pt to CT

## 2022-10-19 NOTE — Progress Notes (Signed)
Received patient in bed ,non verbally responsive due to altered mental status as admitting diagnosis.Patient is moaning ,groaning on sternal rub,as times voice.Vita signs are all normal and stable.  Patient's son call for Hemodialysis phone consent as witnessed /heard/confirmed by another nurse.  Access used :Right upper fistula-works well during HD.  HD treatment issues;Several episodes of hypotension on the middle of her treatment ,NS 100 cc bolus given X 2. Post NS boluses were given,bp is getting lower.BP cuff was reposition on her right leg and normal blood pressure maintained for the rest of the treatment.                               Patient venous access was infiltrated at the middle of the treatment.CN made aware.Re-cannulate to a different site t,to continue treatment.Ice pack applied at the swelling area above the venous access.  Medicines given.None  Bolus given : 100 cc x 2  Fluid removed. 1500 cc  Duration of treatment:4 hours.  Pre and Post Hd WT: Unable to get due to patient AMS  and patient E.D bed has no wt. Scale.  Hands off to floor nurse.

## 2022-10-19 NOTE — ED Provider Notes (Addendum)
[facility] EMERGENCY DEPARTMENT Provider Note   CSN: [id number] Arrival date & time: 10/19/22  5397     History  Chief Complaint  Patient presents with   Altered Mental Status    [name] is a 71 y.o. female.  Level 5 caveat due to altered mental status.  History provided by EMS, patient's sister and her son.  She was recently admitted for COVID and hypoxia.  She has been at a nursing facility here the last few weeks.  Has been having some hallucinations overnight, confusion.  Aunt states that she has been having some right eye gaze deviation here the last few weeks sometimes to the left.  Family thinks that she might of had a stroke here recently.  She does not make urine.  She has a history of end-stage renal disease on hemodialysis.  Has not missed any dialysis sessions but was supposed to have dialysis today.  Patient has not been sick here recently otherwise.  Sister states that she was with her yesterday and she was fairly conversational which is her baseline.  Today she is not able to follow conversation as well.  She saying her name over and over again.  EMS thought that she is moving all of her extremities fairly well and no real focal symptoms to suggest stroke.  Speech has been a little bit slurred may be.  The history is provided by the patient.       Home Medications Prior to Admission medications   Medication Sig Start Date End Date Taking? Authorizing Provider  albuterol (PROVENTIL) (2.5 MG/3ML) 0.083% nebulizer solution Take 3 mLs (2.5 mg total) by nebulization every 4 (four) hours as needed for wheezing. 05/04/22   Elgergawy, Silver Huguenin, MD  albuterol (VENTOLIN HFA) 108 (90 Base) MCG/ACT inhaler Inhale 1-2 puffs into the lungs every 6 (six) hours as needed for wheezing or shortness of breath. 01/11/22   Mesner, Corene Cornea, MD  atorvastatin (LIPITOR) 80 MG tablet Take 80 mg by mouth at bedtime. 03/25/22   [provider]  B Complex-C-Folic Acid  (DIALYVITE 800) 0.8 MG WAFR Take 1 tablet by mouth every morning. 01/28/22   [provider]  benzonatate (TESSALON) 200 MG capsule Take 1 capsule (200 mg total) by mouth 2 (two) times daily as needed for cough. 09/15/22   Nita Sells, MD  clopidogrel (PLAVIX) 75 MG tablet Take 1 tablet (75 mg total) by mouth daily. 03/26/22 03/26/23  Marty Heck, MD  Dextromethorphan-guaiFENesin 20-400 MG TABS Take 1 tablet by mouth in the morning and at bedtime.    [provider]  doxycycline (VIBRA-TABS) 100 MG tablet Take 1 tablet (100 mg total) by mouth 2 (two) times daily. 10/01/22   Laurice Record, MD  escitalopram (LEXAPRO) 10 MG tablet Take 1 tablet (10 mg total) by mouth every morning. 09/15/22   Nita Sells, MD  hydrocortisone (ANUSOL-HC) 2.5 % rectal cream Apply 1 Application topically 4 (four) times daily as needed for hemorrhoids. Patient taking differently: Apply 1 Application topically every 6 (six) hours as needed for hemorrhoids. 09/17/22   Rai, Vernelle Emerald, MD  hydrOXYzine (ATARAX) 10 MG tablet Take 1 tablet (10 mg total) by mouth 3 (three) times daily as needed for itching. 09/15/22   Nita Sells, MD  loperamide (IMODIUM) 2 MG capsule Take 2 mg by mouth every 4 (four) hours as needed for diarrhea or loose stools.    [provider]  midodrine (PROAMATINE) 10 MG tablet Take 1 tablet (  10 mg total) by mouth 3 (three) times daily with meals. 05/21/22   Mercy Riding, MD  pantoprazole (PROTONIX) 40 MG tablet Take 1 tablet (40 mg total) by mouth daily. 08/18/21   Nita Sells, MD  sevelamer carbonate (RENVELA) 800 MG tablet Take 1 tablet (800 mg total) by mouth 3 (three) times daily with meals. 09/15/22   Nita Sells, MD      Allergies    Sulfa antibiotics and Adhesive [tape]    Review of Systems   Review of Systems  Physical Exam Updated Vital Signs BP 97/68   Pulse 86   Temp 98 F (36.7 C) (Rectal)   Resp 20   SpO2 96%   Physical Exam Vitals and nursing note reviewed.  Constitutional:      General: She is not in acute distress.    Appearance: She is well-developed. She is ill-appearing.  HENT:     Head: Normocephalic and atraumatic.     Mouth/Throat:     Mouth: Mucous membranes are moist.  Eyes:     Extraocular Movements: Extraocular movements intact.     Conjunctiva/sclera: Conjunctivae normal.     Pupils: Pupils are equal, round, and reactive to light.  Cardiovascular:     Rate and Rhythm: Normal rate and regular rhythm.     Pulses: Normal pulses.     Heart sounds: Normal heart sounds. No murmur heard. Pulmonary:     Effort: Pulmonary effort is normal. No respiratory distress.     Breath sounds: Normal breath sounds.  Abdominal:     Palpations: Abdomen is soft.     Tenderness: There is no abdominal tenderness.  Musculoskeletal:     Cervical back: Neck supple.  Skin:    General: Skin is warm and dry.     Capillary Refill: Capillary refill takes less than 2 seconds.     Comments: She has bilateral fistulas, uses the right arm for dialysis with good thrill in the right fistula  Neurological:     Mental Status: She is alert.     Comments: Patient is alert, she does not really follow commands, her eyes are open spontaneously, do not appreciate any obvious slurred speech or facial droop, to painful stimuli she appears to move all extremities equally, she appears confused and overall difficult to get a good neurologic exam     ED Results / Procedures / Treatments   Labs (all labs ordered are listed, but only abnormal results are displayed) Labs Reviewed  CBC WITH DIFFERENTIAL/PLATELET - Abnormal; Notable for the following components:      Result Value   RBC 3.66 (*)    Hemoglobin 11.9 (*)    MCV 106.3 (*)    RDW 18.7 (*)    nRBC 0.3 (*)    Abs Immature Granulocytes 0.08 (*)    All other components within normal limits  COMPREHENSIVE METABOLIC PANEL - Abnormal; Notable for the following  components:   Potassium 5.4 (*)    Chloride 94 (*)    CO2 20 (*)    Glucose, Bld 191 (*)    BUN 58 (*)    Creatinine, Ser 5.97 (*)    Calcium 11.2 (*)    Albumin 3.2 (*)    Total Bilirubin 1.4 (*)    GFR, Estimated 7 (*)    Anion gap 28 (*)    All other components within normal limits  MAGNESIUM - Abnormal; Notable for the following components:   Magnesium 2.7 (*)    All other  components within normal limits  TSH - Abnormal; Notable for the following components:   TSH 5.044 (*)    All other components within normal limits  CBG MONITORING, ED - Abnormal; Notable for the following components:   Glucose-Capillary 165 (*)    All other components within normal limits  LIPASE, BLOOD    EKG EKG Interpretation  Date/Time:  Monday October 19 2022 09:44:13 EST Ventricular Rate:  96 PR Interval:  192 QRS Duration: 99 QT Interval:  389 QTC Calculation: 492 R Axis:   86 Text Interpretation: Sinus rhythm Borderline right axis deviation Borderline prolonged QT interval Confirmed by Lennice Sites ([id number]) on 10/19/2022 10:29:47 AM  Radiology DG Chest Portable 1 View  Result Date: 10/19/2022 CLINICAL DATA:  Altered mental status EXAM: PORTABLE CHEST 1 VIEW COMPARISON:  Chest x-ray September 10, 2022 FINDINGS: Unchanged cardiomediastinal contours including cardiomegaly and tortuous thoracic aorta. Calcified atherosclerosis. Bilateral costophrenic angles are excluded from the field of view, limiting evaluation of the lungs. Bilateral mild reticular pulmonary opacities. No large pneumothorax or pleural effusion. No acute osseous abnormality. The visualized upper abdomen is unremarkable. IMPRESSION: 1. Bilateral mild reticular pulmonary opacities, favored to represent mild pulmonary edema. 2. Unchanged cardiomegaly. Electronically Signed   By: Beryle Flock M.D.   On: 10/19/2022 11:17   CT Head Wo Contrast  Result Date: 10/19/2022 CLINICAL DATA:  Mental status change of unknown cause.  Confusion. Slurred speech. Right gaze. EXAM: CT HEAD WITHOUT CONTRAST TECHNIQUE: Contiguous axial images were obtained from the base of the skull through the vertex without intravenous contrast. RADIATION DOSE REDUCTION: This exam was performed according to the departmental dose-optimization program which includes automated exposure control, adjustment of the mA and/or kV according to patient size and/or use of iterative reconstruction technique. COMPARISON:  06/22/2022 FINDINGS: Brain: Advanced generalized atrophy. Extensive chronic small-vessel ischemic changes throughout the white matter. No sign of acute infarction, mass lesion, hemorrhage, hydrocephalus or extra-axial collection. No change since the prior examination. Vascular: There is atherosclerotic calcification of the major vessels at the base of the brain. Skull: Negative Sinuses/Orbits: Clear/normal Other: None IMPRESSION: No acute CT finding. Advanced generalized atrophy. Extensive chronic small-vessel ischemic changes throughout the white matter. Electronically Signed   By: Nelson Chimes M.D.   On: 10/19/2022 10:37    Procedures .Critical Care  Performed by: Lennice Sites, DO Authorized by: Lennice Sites, DO   Critical care provider statement:    Critical care time (minutes):  45   Critical care was necessary to treat or prevent imminent or life-threatening deterioration of the following conditions:  CNS failure or compromise   Critical care was time spent personally by me on the following activities:  Blood draw for specimens, development of treatment plan with patient or surrogate, discussions with consultants, discussions with primary provider, evaluation of patient's response to treatment, obtaining history from patient or surrogate, ordering and review of laboratory studies, ordering and review of radiographic studies, ordering and performing treatments and interventions, pulse oximetry, re-evaluation of patient's condition and review  of old charts   I assumed direction of critical care for this patient from another provider in my specialty: no       Medications Ordered in ED Medications  midazolam PF (VERSED) 5 MG/ML injection (has no administration in time range)  LORazepam (ATIVAN) injection 1 mg (has no administration in time range)  levETIRAcetam (KEPPRA) IVPB 1000 mg/100 mL premix (1,000 mg Intravenous New Bag/Given 10/19/22 1104)    ED Course/ Medical Decision Making/ A&P  Medical Decision Making Amount and/or Complexity of Data Reviewed Labs: ordered. Radiology: ordered.  Risk Prescription drug management. Decision regarding hospitalization.   MISHAWN DIDION is here with altered mental status, history of stroke, CAD, heart failure, end-stage renal disease on hemodialysis, hypertension, high cholesterol.  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  History of diabetes.  Blood sugar normal upon arrival.  Patient currently uses AV fistula on the right, does not use the left anymore.  Patient appears confused may be little encephalopathic.  Does not appear to have any obvious stroke symptoms and exam is overall nonfocal.  She appears to have a right-sided gaze preference but moves all extremities equally.  She states her name frequently.  Unable to provide much history.  I talked with both the aunt and her son who do want to make her DNR/DNI.  She had palliative care consultation last hospital stay that recommended this but patient per note wanted to be full code but at this time she does not have capacity to make this decisions.  They think that she is transitioning to likely failure to thrive and demise.  However they do want to pursue work-up.  Upon my reevaluation of her when her sister arrived, it appears that she had about 1 minute of seizure-like activity.  This resolved on its own.  I talked with Dr. Lorrin Goodell with neurology who recommended pursuing stroke  work-up with CT of the head as well as CTA.  We will load her with IV Keppra.  Differential diagnosis includes possible stroke now with seizures versus head bleed versus metabolic process.  She does not make urine anymore.  She has normal vitals.  No fever.  Its unlikely that this is infectious process.  We will check CBC, CMP, magnesium, chest x-ray.  EKG per my review interpretation shows sinus rhythm.  No ischemic changes.  We will talk with nephrology team as well.   Talked with Dr. Posey Pronto with nephrology who will get patient on this for dialysis.  Patient thus far with no further seizure episodes after getting Keppra.  Per my review and interpretation of labs no significant leukocytosis or anemia or electrolyte abnormality.  Creatinine appears to be at baseline.  CT scan of the head is unremarkable.  Chest x-ray per my review and interpretation with no obvious pneumonia.  Overall patient has been evaluated by neurology and nephrology.  We will complete altered mental status/stroke work-up with CTA of head, MRI.  At this time I do not see any obvious infectious process or major metabolic process.  This could be a primary seizure disorder.  Patient is DNR/DNI.  Hemodynamically stable.  Will admit to medicine for further work-up.  This chart was dictated using voice recognition software.  Despite best efforts to proofread,  errors can occur which can change the documentation meaning.     Final Clinical Impression(s) / ED Diagnoses Final diagnoses:  Altered mental status, unspecified altered mental status type  Seizure-like activity [facility])    Rx / DC Orders ED Discharge Orders     None         Lennice Sites, DO 10/19/22 [place], [place], DO 10/19/22 1220

## 2022-10-19 NOTE — Progress Notes (Signed)
  Evaluation after Contrast Extravasation  Patient seen and examined immediately after contrast extravasation while in [facility] CT. Approximately 60 cc of contrast extravasated into the left upper arm/antecubital area.   Exam: There is swelling at the left upper arm area.  There is no erythema. There is no discoloration. There are no blisters. There are no signs of decreased perfusion of the skin.  It is no warm to touch.  Unable to assess for full ROM - patient not able to follow commands.  Radial pulse is normal.  Per contrast extravasation protocol, I have instructed the patient to keep an ice pack on the area for 20-60 minutes at a time for about 48 hours.   Keep arm elevated as much as possible.   The patient understands to call the radiology department if there is: - increase in pain or swelling - changed or altered sensation - ulceration or blistering - increasing redness - warmth or increasing firmness - decreased tissue perfusion as noted by decreased capillary refill or discoloration of skin - decreased pulses peripheral to site   Theresa Duty, NP 10/19/2022 1:20 PM

## 2022-10-19 NOTE — Consult Note (Signed)
[facility] Renal Consultation Note    Indication for Consultation:  Management of ESRD/hemodialysis, anemia, hypertension/volume, and secondary hyperparathyroidism.  HPI: [name] is a 71 y.o. female with PMH including ESRD on dialysis MWF, CVA, CHF, HTN, HLD who was admitted to [facility] last month for COVID. Her sister reports she was discharged to a SNF but seems to have been steadily declining since then. Today, the SNF staff said she was having hallucinations and anxiety overnight. She was not able to hold a conversation and was repeating her name repeatedly. Her sister also thinks she had a seizure in the ED. Her sister is concerned about IV in left arm but patient does not use that fistula anymore. Labs notable for K+ 5.4, BUN 58. Cr 5.97, Ca 11.2, WBC 10.0, Hgb 11.9, Plt 317. CT of the head with extensive small vessel disease. CXR consistent with mild pulmonary edema. VSS. She was given ativan to facilitate CT scan and is sleeping on my arrival. Unable to contribute to ROS. She has been compliant with outpatient HD and our PA did notice some confusion on 10/16/22 but pt was able to consent to HD.   Past Medical History:  Diagnosis Date   Acute ischemic stroke ([place]) 02/23/2017   Acute on chronic diastolic CHF (congestive heart failure) ([place])    Adenomatous polyp of colon 10/2010, 2006, 2015   Anemia in CKD (chronic kidney disease) 11/07/2012   s/p blood transfusion.    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  Had some early rejection then stable function for years, then had slow decline of function and went back on hemodialysis in 2012.   Gets HD TTS schedule at [facility] on [facility] still using L forearm AVF.   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   Past Surgical History:  Procedure Laterality Date   ABDOMINAL AORTOGRAM W/LOWER EXTREMITY N/A 03/26/2022   Procedure: ABDOMINAL AORTOGRAM W/LOWER EXTREMITY;  Surgeon: Marty Heck, MD;  Location: [place] [facility];  Service: Cardiovascular;  Laterality: N/A;  Left   AV FISTULA PLACEMENT     for dialysis   AV FISTULA PLACEMENT Left 11/22/2015   Procedure: ARTERIOVENOUS (AV) FISTULA CREATION-LEFT BRACHIOCEPHALIC;  Surgeon: Serafina Mitchell, MD;  Location: [place];  Service: Vascular;  Laterality: Left;   AV FISTULA PLACEMENT Right 03/15/2020   Procedure: INSERTION OF ARTERIOVENOUS (AV) GORE-TEX GRAFT ARM ( BRACHIAL AXILLARY );  Surgeon: Katha Cabal, MD;  Location: [facility];  Service: Vascular;  Laterality: Right;   AV FISTULA PLACEMENT Right 05/15/2022   Procedure: EXPLORATION  OF  RIGHT ARM ARTERIOVENOUS GORE-TEX GRAFT;  Surgeon: Angelia Mould, MD;  Location: [place];  Service: Vascular;  Laterality: Right;   [place] REMOVAL Right 04/27/2022   Procedure: REVISION OF RIGHT ARTERIOVENOUS GRAFT AND EXCISION OF RIGHT DEGENERATED GRAFT (AVG);  Surgeon:  Angelia Mould, MD;  Location: [facility] OR;  Service: Vascular;  Laterality: Right;   BACK SURGERY     CERVICAL FUSION     CHOLECYSTECTOMY  12/02/2012   Procedure: LAPAROSCOPIC CHOLECYSTECTOMY WITH INTRAOPERATIVE CHOLANGIOGRAM;  Surgeon: Edward Jolly, MD;  Location: [place] OR;  Service: General;  Laterality: N/A;   EYE SURGERY Bilateral    cataract surgery   EYE SURGERY Left 2019   laser   HEMATOMA EVACUATION Left 12/24/2016   Procedure: EVACUATION HEMATOMA LEFT UPPER ARM;   Surgeon: Waynetta Sandy, MD;  Location: [place];  Service: Vascular;  Laterality: Left;   I & D EXTREMITY Left 12/31/2016   Procedure: IRRIGATION AND DEBRIDEMENT EXTREMITY;  Surgeon: Angelia Mould, MD;  Location: [place];  Service: Vascular;  Laterality: Left;   INSERTION OF DIALYSIS CATHETER Right 12/24/2016   Procedure: INSERTION OF DIALYSIS CATHETER;  Surgeon: Waynetta Sandy, MD;  Location: [place];  Service: Vascular;  Laterality: Right;   INSERTION OF DIALYSIS CATHETER Right 02/04/2017   Procedure: INSERTION OF DIALYSIS CATHETER;  Surgeon: Waynetta Sandy, MD;  Location: [place];  Service: Vascular;  Laterality: Right;   [place] Left 10/23/2016   Procedure: Fistulagram;  Surgeon: Elam Dutch, MD;  Location: [place] [facility];  Service: Cardiovascular;  Laterality: Left;   PERIPHERAL VASCULAR INTERVENTION  03/26/2022   Procedure: PERIPHERAL VASCULAR INTERVENTION;  Surgeon: Marty Heck, MD;  Location: [place] [facility];  Service: Cardiovascular;;  left sfa   PERIPHERAL VASCULAR THROMBECTOMY Right 04/16/2020   Procedure: PERIPHERAL VASCULAR THROMBECTOMY;  Surgeon: Katha Cabal, MD;  Location: [place] [facility];  Service: Cardiovascular;  Laterality: Right;   RESECTION OF ARTERIOVENOUS FISTULA ANEURYSM Left 11/22/2015   Procedure: RESECTION OF LEFT RADIOCEPHALIC FISTULA ANEURYSM ;  Surgeon: Serafina Mitchell, MD;  Location: [place] OR;  Service: Vascular;  Laterality: Left;   REVISON OF ARTERIOVENOUS FISTULA Left 12/22/2016   Procedure: REVISON OF LEFT ARTERIOVENOUS FISTULA;  Surgeon: Waynetta Sandy, MD;  Location: [place];  Service: Vascular;  Laterality: Left;   REVISON OF ARTERIOVENOUS FISTULA Left 02/04/2017   Procedure: REVISON OF LEFT UPPER ARM ARTERIOVENOUS FISTULA;  Surgeon: Waynetta Sandy, MD;  Location: [place];  Service: Vascular;  Laterality: Left;   UPPER EXTREMITY ANGIOGRAPHY  Bilateral 09/19/2019   Procedure: UPPER EXTREMITY ANGIOGRAPHY;  Surgeon: Katha Cabal, MD;  Location: [place] [facility];  Service: Cardiovascular;  Laterality: Bilateral;   Family History  Problem Relation Age of Onset   Colon cancer Brother    Cancer Brother    Coronary artery disease Mother 63   Hyperlipidemia Mother    Hypertension Mother    Stroke Maternal Aunt    Esophageal cancer Neg Hx    Stomach cancer Neg Hx    Rectal cancer Neg Hx    Social History:  reports that she quit smoking about 30 years ago. Her smoking use included cigarettes. She has never been exposed to tobacco smoke. She has never used smokeless tobacco. She reports that she does not drink alcohol and does not use drugs.  ROS: As per HPI otherwise negative.  Physical Exam: Vitals:   10/19/22 0930 10/19/22 0933 10/19/22 0953  BP: 97/68    Pulse:  86   Resp:  20   Temp:   98 F (36.7 C)  TempSrc:   Rectal  SpO2:  96%      General: Frail appearing female, sleeping,  NAD Head: Normocephalic, atraumatic, sclera non-icteric, mucus membranes are moist. Neck: JVD not elevated. Lungs: Clear bilaterally to auscultation without wheezes, rales, or rhonchi. Breathing is unlabored. Heart: RRR with normal S1, S2. No murmurs, rubs, or gallops appreciated. Abdomen: Soft, non-tender, non-distended with normoactive bowel sounds.No obvious abdominal masses. Lower extremities: No edema b/l lower extremities Neuro: sleeping, opens eyes briefly to voice Dialysis Access: RUE AVG + bruit, old aneurysmal AVF in L arm  Allergies  Allergen Reactions   Sulfa Antibiotics Other (See Comments)    Both pt's parents allergic to this type of medication    Adhesive [Tape] Itching   Prior to Admission medications   Medication Sig Start Date End Date Taking? Authorizing Provider  albuterol (PROVENTIL) (2.5 MG/3ML) 0.083% nebulizer solution Take 3 mLs (2.5 mg total) by nebulization every 4 (four) hours as needed for wheezing.  05/04/22   Elgergawy, Silver Huguenin, MD  albuterol (VENTOLIN HFA) 108 (90 Base) MCG/ACT inhaler Inhale 1-2 puffs into the lungs every 6 (six) hours as needed for wheezing or shortness of breath. 01/11/22   Mesner, Corene Cornea, MD  atorvastatin (LIPITOR) 80 MG tablet Take 80 mg by mouth at bedtime. 03/25/22   [provider]  B Complex-C-Folic Acid (DIALYVITE 355) 0.8 MG WAFR Take 1 tablet by mouth every morning. 01/28/22   [provider]  benzonatate (TESSALON) 200 MG capsule Take 1 capsule (200 mg total) by mouth 2 (two) times daily as needed for cough. 09/15/22   Nita Sells, MD  clopidogrel (PLAVIX) 75 MG tablet Take 1 tablet (75 mg total) by mouth daily. 03/26/22 03/26/23  Marty Heck, MD  Dextromethorphan-guaiFENesin 20-400 MG TABS Take 1 tablet by mouth in the morning and at bedtime.    [provider]  doxycycline (VIBRA-TABS) 100 MG tablet Take 1 tablet (100 mg total) by mouth 2 (two) times daily. 10/01/22   Laurice Record, MD  escitalopram (LEXAPRO) 10 MG tablet Take 1 tablet (10 mg total) by mouth every morning. 09/15/22   Nita Sells, MD  hydrocortisone (ANUSOL-HC) 2.5 % rectal cream Apply 1 Application topically 4 (four) times daily as needed for hemorrhoids. Patient taking differently: Apply 1 Application topically every 6 (six) hours as needed for hemorrhoids. 09/17/22   Rai, Vernelle Emerald, MD  hydrOXYzine (ATARAX) 10 MG tablet Take 1 tablet (10 mg total) by mouth 3 (three) times daily as needed for itching. 09/15/22   Nita Sells, MD  loperamide (IMODIUM) 2 MG capsule Take 2 mg by mouth every 4 (four) hours as needed for diarrhea or loose stools.    [provider]  midodrine (PROAMATINE) 10 MG tablet Take 1 tablet (10 mg total) by mouth 3 (three) times daily with meals. 05/21/22   Mercy Riding, MD  pantoprazole (PROTONIX) 40 MG tablet Take 1 tablet (40 mg total) by mouth daily. 08/18/21   Nita Sells, MD  sevelamer carbonate  (RENVELA) 800 MG tablet Take 1 tablet (800 mg total) by mouth 3 (three) times daily with meals. 09/15/22   Nita Sells, MD   Current Facility-Administered Medications  Medication Dose Route Frequency Provider Last Rate Last Admin   LORazepam (ATIVAN) injection 1 mg  1 mg Intravenous Once Curatolo, Adam, DO       midazolam PF (VERSED) 5 MG/ML injection            Current Outpatient Medications  Medication Sig Dispense Refill   albuterol (PROVENTIL) (2.5 MG/3ML) 0.083% nebulizer solution Take 3 mLs (2.5 mg total) by nebulization every 4 (  four) hours as needed for wheezing. 75 mL 12   albuterol (VENTOLIN HFA) 108 (90 Base) MCG/ACT inhaler Inhale 1-2 puffs into the lungs every 6 (six) hours as needed for wheezing or shortness of breath. 1 each 0   atorvastatin (LIPITOR) 80 MG tablet Take 80 mg by mouth at bedtime.     B Complex-C-Folic Acid (DIALYVITE 536) 0.8 MG WAFR Take 1 tablet by mouth every morning.     benzonatate (TESSALON) 200 MG capsule Take 1 capsule (200 mg total) by mouth 2 (two) times daily as needed for cough. 20 capsule 0   clopidogrel (PLAVIX) 75 MG tablet Take 1 tablet (75 mg total) by mouth daily. 30 tablet 11   Dextromethorphan-guaiFENesin 20-400 MG TABS Take 1 tablet by mouth in the morning and at bedtime.     doxycycline (VIBRA-TABS) 100 MG tablet Take 1 tablet (100 mg total) by mouth 2 (two) times daily. 60 tablet 5   escitalopram (LEXAPRO) 10 MG tablet Take 1 tablet (10 mg total) by mouth every morning. 8 tablet 0   hydrocortisone (ANUSOL-HC) 2.5 % rectal cream Apply 1 Application topically 4 (four) times daily as needed for hemorrhoids. (Patient taking differently: Apply 1 Application topically every 6 (six) hours as needed for hemorrhoids.) 30 g 0   hydrOXYzine (ATARAX) 10 MG tablet Take 1 tablet (10 mg total) by mouth 3 (three) times daily as needed for itching. 30 tablet 0   loperamide (IMODIUM) 2 MG capsule Take 2 mg by mouth every 4 (four) hours as needed for  diarrhea or loose stools.     midodrine (PROAMATINE) 10 MG tablet Take 1 tablet (10 mg total) by mouth 3 (three) times daily with meals.     pantoprazole (PROTONIX) 40 MG tablet Take 1 tablet (40 mg total) by mouth daily. 60 tablet    sevelamer carbonate (RENVELA) 800 MG tablet Take 1 tablet (800 mg total) by mouth 3 (three) times daily with meals.     Labs: Basic Metabolic Panel: Recent Labs  Lab 10/13/22 0901 10/19/22 1005  NA 137 142  K 4.6 5.4*  CL 91* 94*  CO2  --  20*  GLUCOSE 83 191*  BUN 32* 58*  CREATININE 3.30* 5.97*  CALCIUM  --  11.2*   Liver Function Tests: Recent Labs  Lab 10/19/22 1005  AST 31  ALT 12  ALKPHOS 104  BILITOT 1.4*  PROT 7.0  ALBUMIN 3.2*   Recent Labs  Lab 10/19/22 1005  LIPASE 44   No results for input(s): "AMMONIA" in the last 168 hours. CBC: Recent Labs  Lab 10/13/22 0901 10/19/22 1005  WBC  --  10.0  NEUTROABS  --  7.0  HGB 13.3 11.9*  HCT 39.0 38.9  MCV  --  106.3*  PLT  --  317   Cardiac Enzymes: No results for input(s): "CKTOTAL", "CKMB", "CKMBINDEX", "TROPONINI" in the last 168 hours. CBG: Recent Labs  Lab 10/19/22 0939  GLUCAP 165*   Iron Studies: No results for input(s): "IRON", "TIBC", "TRANSFERRIN", "FERRITIN" in the last 72 hours. Studies/Results: DG Chest Portable 1 View  Result Date: 10/19/2022 CLINICAL DATA:  Altered mental status EXAM: PORTABLE CHEST 1 VIEW COMPARISON:  Chest x-ray September 10, 2022 FINDINGS: Unchanged cardiomediastinal contours including cardiomegaly and tortuous thoracic aorta. Calcified atherosclerosis. Bilateral costophrenic angles are excluded from the field of view, limiting evaluation of the lungs. Bilateral mild reticular pulmonary opacities. No large pneumothorax or pleural effusion. No acute osseous abnormality. The visualized upper abdomen is unremarkable.  IMPRESSION: 1. Bilateral mild reticular pulmonary opacities, favored to represent mild pulmonary edema. 2. Unchanged  cardiomegaly. Electronically Signed   By: Beryle Flock M.D.   On: 10/19/2022 11:17   CT Head Wo Contrast  Result Date: 10/19/2022 CLINICAL DATA:  Mental status change of unknown cause. Confusion. Slurred speech. Right gaze. EXAM: CT HEAD WITHOUT CONTRAST TECHNIQUE: Contiguous axial images were obtained from the base of the skull through the vertex without intravenous contrast. RADIATION DOSE REDUCTION: This exam was performed according to the departmental dose-optimization program which includes automated exposure control, adjustment of the mA and/or kV according to patient size and/or use of iterative reconstruction technique. COMPARISON:  06/22/2022 FINDINGS: Brain: Advanced generalized atrophy. Extensive chronic small-vessel ischemic changes throughout the white matter. No sign of acute infarction, mass lesion, hemorrhage, hydrocephalus or extra-axial collection. No change since the prior examination. Vascular: There is atherosclerotic calcification of the major vessels at the base of the brain. Skull: Negative Sinuses/Orbits: Clear/normal Other: None IMPRESSION: No acute CT finding. Advanced generalized atrophy. Extensive chronic small-vessel ischemic changes throughout the white matter. Electronically Signed   By: Nelson Chimes M.D.   On: 10/19/2022 10:37    Dialysis Orders: Center: [facility]  on MWF . 160NRe 4 hours BFR 350 DFR 600 EDW 44.8kg 2K 2Ca AVG 16g No heparin Mircera 97mg IV q 4 weeks- last dose 10/16/22 Hectorol 576m IV q HD Sensipar '20mg'$  PO q HD  Assessment/Plan:  AMS: Reportedly had hallucinations and possible seizure activity this AM. Family reports she has been declining since she had COVID approx. 1 month ago. Doubt uremia as she is compliant with HD and BUN/Cr are close to baseline. Work up per primary team/neurology  ESRD:  On MWF, will plan for HD today (consent obtained from sister)  Hypertension/volume: BP controlled. Mild edema on CXR, UF to EDW as tolerated today.   Anemia:  Hgb at goal, ESA recently dosed.   Metabolic bone disease: Calcium is elevated, will d/c hectorol for now. Continue sensipar. Last outpatient PTH ws 1665 on 09/21/22.   Nutrition:  Currently NPO, will need renal diet/protein supplements  SaAnice PaganiniPA-C 10/19/2022, 12:16 PM  [facility] Pager: [phone]

## 2022-10-19 NOTE — Progress Notes (Signed)
VBG shows acute decompensated respiratory alkalosis, ammonia normal.

## 2022-10-19 NOTE — Consult Note (Shared)
Neurology Consultation  Reason for Consult: altered mental status, right gaze deviation  Referring Physician: Dr. Ronnald Nian  CC: none  History is obtained from:chart and family  HPI: [name] is a 71 y.o. female with history of end-stage renal disease on dialysis, CHF, anemia, CAD, non-STEMI, GERD, stroke, hypertension and hyperlipidemia who presents with acute onset altered mental status.  Patient's family member states that she was called by patient's nursing facility this morning and that patient had become acutely anxious and agitated and had begun hallucinating.  Per family patient is alert and oriented at baseline, however she is unable to answer questions or follow commands today.  Patient has a history of a stroke with residual left-sided weakness, and she has had right gaze deviation for approximately 3 weeks per her family member.  Earlier today in the ED, patient had an episode of foaming at the mouth and drooling and inability to speak which lasted about a minute and included hypoxia and tachycardia.  On exam, patient is unable to answer questions or follow commands and has right gaze deviation but does not have any other focal signs.  ROS: Unable to obtain due to altered mental status.   Past Medical History:  Diagnosis Date   Acute ischemic stroke ([place]) 02/23/2017   Acute on chronic diastolic CHF (congestive heart failure) ([place])    Adenomatous polyp of colon 10/2010, 2006, 2015   Anemia in CKD (chronic kidney disease) 11/07/2012   s/p blood transfusion.    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  Had some early rejection then stable  function for years, then had slow decline of function and went back on hemodialysis in 2012.  Gets HD TTS schedule at [facility] on [facility] still using L forearm AVF.   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     Family History  Problem Relation Age of Onset   Colon cancer Brother    Cancer Brother    Coronary artery disease Mother 70   Hyperlipidemia Mother    Hypertension Mother    Stroke Maternal Aunt    Esophageal cancer Neg Hx    Stomach cancer Neg Hx    Rectal cancer Neg Hx      Social History:   reports that she quit smoking about 30 years ago. Her smoking use included cigarettes. She has never been exposed to tobacco smoke. She has never used smokeless tobacco. She reports that she does not drink alcohol and does not use drugs.  Medications  Current Facility-Administered Medications:    levETIRAcetam (KEPPRA) IVPB 1000 mg/100 mL premix, 1,000 mg, Intravenous, Once, [place], Adam, DO, Last Rate: 400 mL/hr at 10/19/22 1104, 1,000 mg at 10/19/22 1104   midazolam PF (VERSED) 5 MG/ML injection, , , ,   Current Outpatient Medications:    albuterol (PROVENTIL) (2.5 MG/3ML) 0.083% nebulizer  solution, Take 3 mLs (2.5 mg total) by nebulization every 4 (four) hours as needed for wheezing., Disp: 75 mL, Rfl: 12   albuterol (VENTOLIN HFA) 108 (90 Base) MCG/ACT inhaler, Inhale 1-2 puffs into the lungs every 6 (six) hours as needed for wheezing or shortness of breath., Disp: 1 each, Rfl: 0   atorvastatin (LIPITOR) 80 MG tablet, Take 80 mg by mouth at bedtime., Disp: , Rfl:    B Complex-C-Folic Acid (DIALYVITE 767) 0.8 MG WAFR, Take 1 tablet by mouth every morning., Disp: , Rfl:    benzonatate (TESSALON) 200 MG capsule, Take 1 capsule (200 mg  total) by mouth 2 (two) times daily as needed for cough., Disp: 20 capsule, Rfl: 0   clopidogrel (PLAVIX) 75 MG tablet, Take 1 tablet (75 mg total) by mouth daily., Disp: 30 tablet, Rfl: 11   Dextromethorphan-guaiFENesin 20-400 MG TABS, Take 1 tablet by mouth in the morning and at bedtime., Disp: , Rfl:    doxycycline (VIBRA-TABS) 100 MG tablet, Take 1 tablet (100 mg total) by mouth 2 (two) times daily., Disp: 60 tablet, Rfl: 5   escitalopram (LEXAPRO) 10 MG tablet, Take 1 tablet (10 mg total) by mouth every morning., Disp: 8 tablet, Rfl: 0   hydrocortisone (ANUSOL-HC) 2.5 % rectal cream, Apply 1 Application topically 4 (four) times daily as needed for hemorrhoids. (Patient taking differently: Apply 1 Application topically every 6 (six) hours as needed for hemorrhoids.), Disp: 30 g, Rfl: 0   hydrOXYzine (ATARAX) 10 MG tablet, Take 1 tablet (10 mg total) by mouth 3 (three) times daily as needed for itching., Disp: 30 tablet, Rfl: 0   loperamide (IMODIUM) 2 MG capsule, Take 2 mg by mouth every 4 (four) hours as needed for diarrhea or loose stools., Disp: , Rfl:    midodrine (PROAMATINE) 10 MG tablet, Take 1 tablet (10 mg total) by mouth 3 (three) times daily with meals., Disp: , Rfl:    pantoprazole (PROTONIX) 40 MG tablet, Take 1 tablet (40 mg total) by mouth daily., Disp: 60 tablet, Rfl:    sevelamer carbonate (RENVELA) 800 MG tablet, Take 1 tablet (800 mg total) by mouth 3 (three) times daily with meals., Disp: , Rfl:    Exam: Current vital signs: BP 97/68   Pulse 86   Temp 98 F (36.7 C) (Rectal)   Resp 20   SpO2 96%  Vital signs in last 24 hours: Temp:  [98 F (36.7 C)] 98 F (36.7 C) (11/06 0953) Pulse Rate:  [86] 86 (11/06 0933) Resp:  [20] 20 (11/06 0933) BP: (97)/(68) 97/68 (11/06 0930) SpO2:  [96 %] 96 % (11/06 0933)  GENERAL: Awake, alert, in no acute distress Head: Normocephalic and atraumatic, without obvious abnormality EENT: Normal conjunctivae, moist mucous membranes, no  OP obstruction LUNGS: Normal respiratory effort. Non-labored breathing on supplemental O2 CV: Regular rate and rhythm on telemetry Extremities: warm, well perfused, without obvious deformity, dialysis fistulas noted on bilateral arms  NEURO:  Mental Status: Patient's eyes are open, but she does not answer questions or follow commands.  She does not respond to her name She is not able to provide a clear and coherent history of present illness. Speech/Language: speech is incomprehensible and mumbling.   No neglect is noted Cranial Nerves:  II: PERRL  III, IV, VI: Right gaze deviation, does not track VII: Face is symmetric resting with right eye closed  Motor: Moves right upper extremity purposefully and moves all other extremities spontaneously with good antigravity strength. Sensation: Appears  intact to noxious stimuli DTRs: 2+ in bicep, unable to elicit patellar Gait: Deferred  Labs I have reviewed labs in epic and the results pertinent to this consultation are:   CBC    Component Value Date/Time   WBC 10.0 10/19/2022 1005   RBC 3.66 (L) 10/19/2022 1005   HGB 11.9 (L) 10/19/2022 1005   HGB 10.6 (L) 07/13/2018 0814   HGB 8.7 (L) 09/24/2011 1038   HCT 38.9 10/19/2022 1005   HCT 33.0 (L) 07/13/2018 0814   HCT 27.3 (L) 09/24/2011 1038   PLT 317 10/19/2022 1005   PLT 203 07/13/2018 0814   MCV 106.3 (H) 10/19/2022 1005   MCV 91 07/13/2018 0814   MCV 94.5 09/24/2011 1038   MCH 32.5 10/19/2022 1005   MCHC 30.6 10/19/2022 1005   RDW 18.7 (H) 10/19/2022 1005   RDW 16.9 (H) 07/13/2018 0814   RDW 20.1 (H) 09/24/2011 1038   LYMPHSABS 2.2 10/19/2022 1005   LYMPHSABS 1.1 09/24/2011 1038   MONOABS 0.8 10/19/2022 1005   MONOABS 0.6 09/24/2011 1038   EOSABS 0.0 10/19/2022 1005   EOSABS 0.1 09/24/2011 1038   BASOSABS 0.0 10/19/2022 1005   BASOSABS 0.0 09/24/2011 1038    CMP     Component Value Date/Time   NA 137 10/13/2022 0901   NA 140 07/13/2018 0814   K 4.6 10/13/2022 0901    CL 91 (L) 10/13/2022 0901   CO2 31 10/01/2022 1119   GLUCOSE 83 10/13/2022 0901   BUN 32 (H) 10/13/2022 0901   BUN 45 (H) 07/13/2018 0814   CREATININE 3.30 (H) 10/13/2022 0901   CREATININE 3.57 (H) 10/01/2022 1119   CALCIUM 9.4 10/01/2022 1119   CALCIUM 9.6 08/03/2011 1005   PROT 6.2 10/01/2022 1119   PROT 6.9 07/13/2018 0814   ALBUMIN 2.8 (L) 09/16/2022 1323   ALBUMIN 4.1 07/13/2018 0814   AST 19 10/01/2022 1119   ALT 17 10/01/2022 1119   ALKPHOS 157 (H) 09/15/2022 0351   BILITOT 1.2 10/01/2022 1119   BILITOT 0.4 07/13/2018 0814   GFRNONAA 8 (L) 09/16/2022 1323   GFRAA 7 (L) 07/26/2020 0920    Lipid Panel     Component Value Date/Time   CHOL 191 02/22/2021 0249   TRIG 92 04/28/2022 0410   HDL 57 02/22/2021 0249   CHOLHDL 3.4 02/22/2021 0249   VLDL 17 02/22/2021 0249   LDLCALC 117 (H) 02/22/2021 0249     Imaging I have reviewed the images obtained:  CT-scan of the brain: No acute abnormality, atrophy and small vessel disease  CTA head: Pending  MRI examination of the brain: pending  Assessment: 71 year old patient with end-stage renal disease hypertension hyperlipidemia CAD and previous stroke presents with acute onset confusion and hallucinations today from a nursing facility.  She had an episode of foaming at the mouth and inability to speak while in the ED which was concerning for seizure activity.  Patient has right gaze deviation and this has been present for about 3 weeks per family.  Per family, patient is normally alert oriented able to understand her situation and can recognize people around her, however she is unable to answer questions or follow commands or respond to her name today.  Neurological exam is largely nonfocal, with worries for seizure with postictal period or toxic metabolic encephalopathy.  Impression: Seizure with postictal state versus toxic metabolic encephalopathy  Recommendations: -Obtain CTA head -Obtain MRI brain -Routine EEG -Load  with Keppra 1000 mg - Keppra 250 mg daily based on  renal function with additional 250 mg after dialysis -Search for and correct causes of toxic metabolic encephalopathy per primary team    Pt seen by NP/Neuro and later by MD. Note/plan to be edited by MD as needed.  [place] , MSN, AGACNP-BC [facility] See Amion for schedule and pager information 10/19/2022 11:09 AM

## 2022-10-20 ENCOUNTER — Inpatient Hospital Stay (HOSPITAL_COMMUNITY): Payer: Medicare Other

## 2022-10-20 DIAGNOSIS — R569 Unspecified convulsions: Secondary | ICD-10-CM | POA: Diagnosis not present

## 2022-10-20 DIAGNOSIS — R4182 Altered mental status, unspecified: Secondary | ICD-10-CM

## 2022-10-20 DIAGNOSIS — G9340 Encephalopathy, unspecified: Secondary | ICD-10-CM | POA: Diagnosis not present

## 2022-10-20 LAB — T4, FREE: Free T4: 1.88 ng/dL — ABNORMAL HIGH (ref 0.61–1.12)

## 2022-10-20 LAB — HEPATITIS B SURFACE ANTIBODY, QUANTITATIVE: Hep B S AB Quant (Post): >1000 m[IU]/mL (ref >9.9)

## 2022-10-20 LAB — VITAMIN B12: Vitamin B-12: 1016 pg/mL — ABNORMAL HIGH (ref 180–914)

## 2022-10-20 LAB — GLUCOSE, CAPILLARY
Glucose-Capillary: 88 mg/dL (ref 70–99)
Glucose-Capillary: 104 mg/dL — ABNORMAL HIGH (ref 70–99)

## 2022-10-20 LAB — HIV ANTIBODY (ROUTINE TESTING W REFLEX): HIV Screen 4th Generation wRfx: NONREACTIVE

## 2022-10-20 LAB — RPR: RPR Ser Ql: NONREACTIVE

## 2022-10-20 MED ORDER — SEVELAMER CARBONATE 800 MG PO TABS
800.0000 mg | ORAL_TABLET | Freq: Three times a day (TID) | ORAL | Status: DC
  Administered 2022-10-22 – 2022-10-27 (×12): 800 mg via ORAL
  Filled 2022-10-20 (×13): qty 1

## 2022-10-20 MED ORDER — SODIUM CHLORIDE 0.9 % IV SOLN
75.0000 mL/h | INTRAVENOUS | Status: DC
  Administered 2022-10-20 (×2): 75 mL/h via INTRAVENOUS

## 2022-10-20 MED ORDER — ESCITALOPRAM OXALATE 10 MG PO TABS
10.0000 mg | ORAL_TABLET | Freq: Every morning | ORAL | Status: DC
  Administered 2022-10-22 – 2022-10-27 (×6): 10 mg via ORAL
  Filled 2022-10-20 (×6): qty 1

## 2022-10-20 MED ORDER — HYDROXYZINE HCL 10 MG PO TABS: 10.0000 mg | ORAL_TABLET | Freq: Three times a day (TID) | ORAL | Status: DC | PRN

## 2022-10-20 MED ORDER — ORAL CARE MOUTH RINSE
15.0000 mL | OROMUCOSAL | Status: DC
  Administered 2022-10-20 – 2022-10-27 (×56): 15 mL via OROMUCOSAL

## 2022-10-20 MED ORDER — LORAZEPAM 2 MG/ML IJ SOLN: 4.0000 mg | INTRAMUSCULAR | Status: DC | PRN

## 2022-10-20 MED ORDER — ATORVASTATIN CALCIUM 80 MG PO TABS
80.0000 mg | ORAL_TABLET | Freq: Every day | ORAL | Status: DC
  Administered 2022-10-22 – 2022-10-26 (×5): 80 mg via ORAL
  Filled 2022-10-20 (×5): qty 1

## 2022-10-20 MED ORDER — LEVETIRACETAM IN NACL 500 MG/100ML IV SOLN
500.0000 mg | INTRAVENOUS | Status: DC
  Administered 2022-10-20: 500 mg via INTRAVENOUS
  Filled 2022-10-20: qty 100

## 2022-10-20 MED ORDER — ALBUTEROL SULFATE (2.5 MG/3ML) 0.083% IN NEBU: 2.5000 mg | INHALATION_SOLUTION | RESPIRATORY_TRACT | Status: DC | PRN

## 2022-10-20 MED ORDER — LORAZEPAM 2 MG/ML IJ SOLN: 2.0000 mg | INTRAMUSCULAR | Status: DC | PRN

## 2022-10-20 MED ORDER — MIDODRINE HCL 5 MG PO TABS
10.0000 mg | ORAL_TABLET | Freq: Three times a day (TID) | ORAL | Status: DC
  Administered 2022-10-21 – 2022-10-27 (×15): 10 mg via ORAL
  Filled 2022-10-20 (×16): qty 2

## 2022-10-20 MED ORDER — ALBUTEROL SULFATE HFA 108 (90 BASE) MCG/ACT IN AERS: 1.0000 | INHALATION_SPRAY | Freq: Four times a day (QID) | RESPIRATORY_TRACT | Status: DC | PRN

## 2022-10-20 MED ORDER — ORAL CARE MOUTH RINSE: 15.0000 mL | OROMUCOSAL | Status: DC | PRN

## 2022-10-20 MED ORDER — CHLORHEXIDINE GLUCONATE CLOTH 2 % EX PADS
6.0000 | MEDICATED_PAD | Freq: Every day | CUTANEOUS | Status: DC
  Administered 2022-10-20 – 2022-10-24 (×4): 6 via TOPICAL

## 2022-10-20 MED ORDER — HEPARIN SODIUM (PORCINE) 5000 UNIT/ML IJ SOLN
5000.0000 [IU] | Freq: Two times a day (BID) | INTRAMUSCULAR | Status: DC
  Administered 2022-10-20 – 2022-10-27 (×13): 5000 [IU] via SUBCUTANEOUS
  Filled 2022-10-20 (×13): qty 1

## 2022-10-20 MED ORDER — SODIUM CHLORIDE 0.9 % IV SOLN
250.0000 mg | INTRAVENOUS | Status: DC
  Filled 2022-10-20: qty 2.5

## 2022-10-20 MED ORDER — CLOPIDOGREL BISULFATE 75 MG PO TABS
75.0000 mg | ORAL_TABLET | Freq: Every day | ORAL | Status: DC
  Administered 2022-10-22 – 2022-10-27 (×6): 75 mg via ORAL
  Filled 2022-10-20 (×6): qty 1

## 2022-10-20 MED ORDER — SODIUM CHLORIDE 0.9 % IV SOLN: 250.0000 mg | INTRAVENOUS | Status: DC

## 2022-10-20 MED ORDER — PANTOPRAZOLE SODIUM 40 MG PO TBEC
40.0000 mg | DELAYED_RELEASE_TABLET | Freq: Every day | ORAL | Status: DC
  Filled 2022-10-20: qty 1

## 2022-10-20 NOTE — Progress Notes (Signed)
EEG complete - results pending 

## 2022-10-20 NOTE — Progress Notes (Signed)
LTM EEG hooked up and running - no initial skin breakdown - Paste, taped and wrap, NO MRI leads placed per Dr. Lorrin Goodell. push button tested - [facility] monitoring.

## 2022-10-20 NOTE — ED Notes (Signed)
Phlebotomy tech stated "I will be around to draw her labs"

## 2022-10-20 NOTE — Progress Notes (Signed)
Neurology Progress Note  Brief HPI: 71 year old patient with history of end-stage renal disease on dialysis, CHF, anemia, CAD, non-STEMI, GERD, stroke, hypertension and hyperlipidemia presented with acute onset altered mental status.  Patient was in a nursing facility and suddenly became anxious agitated and had hallucinations.  Per family and chart, patient is alert and oriented at baseline, however on presentation she was unable to follow commands or answer questions.  While in the ED yesterday, patient had an episode of foaming at the mouth being unable to speak and drooling which lasted about a minute and included hypoxia and tachycardia.  Per patient's daughter, she has had right gaze deviation for about 3 weeks, but her son states he has not noted this.  She did have right gaze deviation upon presentation yesterday.  Subjective: Per RN, patient has remained nonverbal, unable to follow commands and unable to answer questions overnight.  She has undergone dialysis.  EEG and MRI pending.  A 15 to 30-second episode of twitching of left shoulder was noted during assessment this morning  Exam: Vitals:   10/20/22 0423 10/20/22 0500  BP:  (!) 147/87  Pulse:    Resp:  17  Temp: 98.7 F (37.1 C)   SpO2:  98%   Gen: In bed, NAD Resp: non-labored breathing, no acute distress  Neuro: Mental Status: Does not respond to name, nonverbal, will open eyes to noxious stimuli. Cranial Nerves: Pupils midline equal round and reactive, does not focus or track, face appears symmetrical at rest Motor: Moves all extremities nonpurposeful he with good antigravity strength, short episode of twitching of left shoulder noted during assessment Sensory: Appears intact to noxious stimuli DTR: 2+ in left bicep unable to elicit elsewhere Gait: Deferred  Pertinent Labs:    Latest Ref Rng & Units 10/19/2022    1:55 PM 10/19/2022   10:05 AM 10/13/2022    9:01 AM  CBC  WBC 4.0 - 10.5 K/uL  10.0    Hemoglobin 12.0 -  15.0 g/dL 11.6  11.9  13.3   Hematocrit 36.0 - 46.0 % 34.0  38.9  39.0   Platelets 150 - 400 K/uL  317         Latest Ref Rng & Units 10/19/2022    1:55 PM 10/19/2022   10:05 AM 10/13/2022    9:01 AM  BMP  Glucose 70 - 99 mg/dL  191  83   BUN 8 - 23 mg/dL  58  32   Creatinine 0.44 - 1.00 mg/dL  5.97  3.30   Sodium 135 - 145 mmol/L 135  142  137   Potassium 3.5 - 5.1 mmol/L 4.9  5.4  4.6   Chloride 98 - 111 mmol/L  94  91   CO2 22 - 32 mmol/L  20    Calcium 8.9 - 10.3 mg/dL  11.2       Imaging Reviewed:  CT head: No acute abnormalities  MRI brain: Pending  Assessment: 71 year old patient with history of end-stage renal disease, CHF, anemia, CAD, non-STEMI, GERD, stroke, hypertension and hyperlipidemia presented with acute onset altered mental status with questionable seizure in the ED yesterday.  Patient's mental status has remained altered, with patient unable to follow commands or answer questions.  Patient is completely nonverbal but does respond to noxious stimuli.  She did have a right gaze deviation upon presentation, which has been present for 3 weeks per family, but she does not have this right gaze deviation on assessment today.  Will obtain EEG  and brain MRI to rule out seizure, stroke or other cause of altered mental status, altered mental status labs ordered and awaiting results.  CTA head was attempted, but nondiagnostic due to contrast extravasation.  RPR pending HIV pending B1 pending MMA pending B12 pending Ammonia 32 TSH 5.044  Impression: Altered mental status, likely toxic metabolic encephalopathy in patient with multiple comorbidities  Recommendations: 1) cEEG 2) brain MRI after EEG. 3) await results of RPR, HIV, MMA, thiamine, B12 4) search for and correction of causes of toxic metabolic encephalopathy per primary team 5) neurology will continue to follow  [place] , MSN, AGACNP-BC [facility] See Amion for schedule and pager  information 10/20/2022 8:34 AM   NEUROHOSPITALIST ADDENDUM Performed a face to face diagnostic evaluation.   I have reviewed the contents of history and physical exam as documented by PA/ARNP/Resident and agree with above documentation.  I have discussed and formulated the above plan as documented. Edits to the note have been made as needed.  Impression/Key exam findings/Plan: 72F with ESRD on HD, stroke with residual L sided weakness, pAF not on AC p/w acute encephalopathy +  hallucinations + agitation. Also reported hx of R gaze deviation for 3 weeks per family. In the ED, had episode of foaming mouth + drooling and inability to speak for about 1 minute. She was noted to have R gaze deviation on initial neuro eval along with no speech.  She was outside tnkase window, no LVO on CT Angio and thus not a candidate for thrombectomy.  Overall, presentation is concerning for a Left hemispheric dysfunction. We put her up on cEEG which showed left sided slowing along with '1Hz'$  GPD with triphasic waves. Given focal deficit noted on exam along with EEG findings, plan to keep her on LTM overnight and take her off in AM if no seizures or epileptiform discharges and get an MRI Brain without contrast(no contrast 2/2 ESRD on HD).  Despite no seizures or epileptiform discharges on EEG so far, given the fact that she had an episode of foaming mouth, drooling and inbility to speak x 1 minute, this is highly concerning for a focal seizure. I am going to start her on Keppra '500mg'$  Daily with additional 250 on days of HD. The noted L temporal slowing on EEG could very well be post ictal.  Donnetta Simpers, MD [facility] [phone]   If 7pm to 7am, please call on call as listed on AMION.

## 2022-10-20 NOTE — ED Notes (Signed)
EEG at bedside.

## 2022-10-20 NOTE — Procedures (Signed)
Patient Name: [name]  MRN: [id number]  Epilepsy Attending: Lora Havens  Referring Physician/Provider: Donnetta Simpers, MD  Date: 10/20/2022 Duration: 33.05 mins  Patient history: 71 year old patient with end-stage renal disease hypertension hyperlipidemia CAD and previous stroke presents with acute onset confusion and hallucinations. EEG to evaluate for seizure  Level of alertness: lethargic   AEDs during EEG study: None  Technical aspects: This EEG study was done with scalp electrodes positioned according to the 10-20 International system of electrode placement. Electrical activity was reviewed with band pass filter of 1-'70Hz'$ , sensitivity of 7 uV/mm, display speed of 70m/sec with a '60Hz'$  notched filter applied as appropriate. EEG data were recorded continuously and digitally stored.  Video monitoring was available and reviewed as appropriate.  Description: EEG showed continuous generalized 3 to 6 Hz theta-delta slowing.  Generalized periodic discharges with triphasic morphology at '1hz'$  were also noted.  Hyperventilation and photic stimulation were not performed.     Patient was noted to have left shoulder twitching in the area over the dialysis fistula.  Concomitant EEG before, during and after the event did not show any EEG changes suggest seizure.  ABNORMALITY - Periodic discharges with triphasic morphology, generalized ( GPDs) - Continuous slow, generalized  IMPRESSION: This study showed generalized periodic discharges with triphasic morphology which can get the ictal-interictal continuum.  However the frequency and morphology is more commonly indicative of toxic-metabolic causes.  Additionally there was moderate to severe diffuse encephalopathy, nonspecific etiology.  Patient was noted to have left shoulder twitching in the area over the dialysis fistula without concomitant EEG change.  This is most likely not an epileptic event.    Pierson Vantol OBarbra Sarks

## 2022-10-20 NOTE — ED Notes (Signed)
PO meds being ordered.  Upon investigation this RN noticed that the pt failed swallow screen around 1330 yesterday but I dont find an order for SLP or completion of SLP swallow eval.  Order has been placed for SLP  swallow eval.

## 2022-10-20 NOTE — Progress Notes (Signed)
[facility] Progress Note   Subjective:   Underwent HD yesterday without any reported issues. Remains encephalopathic and unable to contribute to ROS. Undergoing EEG at present. BP cuff was placed over her RUE AVG, fortunately still has good bruit. BP cuff moved to lower arm and RN updated.   Objective Vitals:   10/20/22 0800 10/20/22 0815 10/20/22 0830 10/20/22 0905  BP: (!) 140/83 (!) 140/76 130/77   Pulse:      Resp: '18 12 15   '$ Temp:    98.2 F (36.8 C)  TempSrc:    Oral  SpO2:       Physical Exam General: Frail female, sleeping, in NAD Heart: RRR, no murmurs, rubs or gallops Lungs: CTA bilaterally without wheezing, rhonchi or rales Abdomen: Soft, non-distended, +BS Extremities: No edema b/l lower extremities Dialysis Access:  RUE AVG + t/b. LUE AVF aneurysmal and pulsatile  Additional Objective Labs: Basic Metabolic Panel: Recent Labs  Lab 10/19/22 1005 10/19/22 1355  NA 142 135  K 5.4* 4.9  CL 94*  --   CO2 20*  --   GLUCOSE 191*  --   BUN 58*  --   CREATININE 5.97*  --   CALCIUM 11.2*  --    Liver Function Tests: Recent Labs  Lab 10/19/22 1005  AST 31  ALT 12  ALKPHOS 104  BILITOT 1.4*  PROT 7.0  ALBUMIN 3.2*   Recent Labs  Lab 10/19/22 1005  LIPASE 44   CBC: Recent Labs  Lab 10/19/22 1005 10/19/22 1355  WBC 10.0  --   NEUTROABS 7.0  --   HGB 11.9* 11.6*  HCT 38.9 34.0*  MCV 106.3*  --   PLT 317  --    Blood Culture    Component Value Date/Time   SDES BLOOD RIGHT ARM 09/10/2022 2205   SPECREQUEST  09/10/2022 2205    BOTTLES DRAWN AEROBIC AND ANAEROBIC Blood Culture results may not be optimal due to an excessive volume of blood received in culture bottles   CULT  09/10/2022 2205    NO GROWTH 5 DAYS Performed at [facility], [place] [address] [place], [place] [address]    REPTSTATUS 09/15/2022 FINAL 09/10/2022 2205    Cardiac Enzymes: No results for input(s): "CKTOTAL", "CKMB", "CKMBINDEX", "TROPONINI" in the  last 168 hours. CBG: Recent Labs  Lab 10/19/22 0939  GLUCAP 165*   Iron Studies: No results for input(s): "IRON", "TIBC", "TRANSFERRIN", "FERRITIN" in the last 72 hours. '@lablastinr3'$ @ Studies/Results: DG Chest 1 View  Result Date: 10/20/2022 CLINICAL DATA:  71 year old female with CHF. EXAM: CHEST  1 VIEW COMPARISON:  Portable chest 10/19/2022 and earlier. FINDINGS: Portable AP supine view at 0551 hours. Stable cardiomegaly and mediastinal contours. Extensive Calcified aortic atherosclerosis. Other mediastinal contours are within normal limits. Large lung volumes, improved lung base ventilation since yesterday. Regressed pulmonary vascularity. No significant pleural effusion. No pneumothorax or consolidation. Stable visualized osseous structures. Visualized tracheal air column is within normal limits. Paucity of bowel gas in the visible upper abdomen. IMPRESSION: 1. Regressed pulmonary edema, larger lung volumes and improved lung base ventilation. 2. Stable cardiomegaly. Aortic Atherosclerosis (ICD10-I70.0). Electronically Signed   By: Genevie Ann M.D.   On: 10/20/2022 06:08   DG Chest Portable 1 View  Result Date: 10/19/2022 CLINICAL DATA:  Altered mental status EXAM: PORTABLE CHEST 1 VIEW COMPARISON:  Chest x-ray September 10, 2022 FINDINGS: Unchanged cardiomediastinal contours including cardiomegaly and tortuous thoracic aorta. Calcified atherosclerosis. Bilateral costophrenic angles are excluded from the  field of view, limiting evaluation of the lungs. Bilateral mild reticular pulmonary opacities. No large pneumothorax or pleural effusion. No acute osseous abnormality. The visualized upper abdomen is unremarkable. IMPRESSION: 1. Bilateral mild reticular pulmonary opacities, favored to represent mild pulmonary edema. 2. Unchanged cardiomegaly. Electronically Signed   By: Beryle Flock M.D.   On: 10/19/2022 11:17   CT Head Wo Contrast  Result Date: 10/19/2022 CLINICAL DATA:  Mental status change  of unknown cause. Confusion. Slurred speech. Right gaze. EXAM: CT HEAD WITHOUT CONTRAST TECHNIQUE: Contiguous axial images were obtained from the base of the skull through the vertex without intravenous contrast. RADIATION DOSE REDUCTION: This exam was performed according to the departmental dose-optimization program which includes automated exposure control, adjustment of the mA and/or kV according to patient size and/or use of iterative reconstruction technique. COMPARISON:  06/22/2022 FINDINGS: Brain: Advanced generalized atrophy. Extensive chronic small-vessel ischemic changes throughout the white matter. No sign of acute infarction, mass lesion, hemorrhage, hydrocephalus or extra-axial collection. No change since the prior examination. Vascular: There is atherosclerotic calcification of the major vessels at the base of the brain. Skull: Negative Sinuses/Orbits: Clear/normal Other: None IMPRESSION: No acute CT finding. Advanced generalized atrophy. Extensive chronic small-vessel ischemic changes throughout the white matter. Electronically Signed   By: Nelson Chimes M.D.   On: 10/19/2022 10:37   Medications:  sodium chloride 75 mL/hr (10/20/22 0904)    atorvastatin  80 mg Oral QHS   Chlorhexidine Gluconate Cloth  6 each Topical Q0600   cinacalcet  30 mg Oral Q M,W,F-HD   clopidogrel  75 mg Oral Daily   escitalopram  10 mg Oral q morning   feeding supplement  237 mL Oral BID BM   heparin  5,000 Units Subcutaneous Q12H   midodrine  10 mg Oral TID WC   mouth rinse  15 mL Mouth Rinse Q2H   pantoprazole  40 mg Oral Daily   sevelamer carbonate  800 mg Oral TID WC    Outpatient Dialysis Orders: Center: [facility]  on MWF . 160NRe 4 hours BFR 350 DFR 600 EDW 44.8kg 2K 2Ca AVG 16g No heparin Mircera 56mg IV q 4 weeks- last dose 10/16/22 Hectorol 585m IV q HD Sensipar '20mg'$  PO q HD  Assessment/Plan:  AMS: Reportedly had hallucinations and possible seizure activity this AM. Family reports she has been  declining since she had COVID approx. 1 month ago. Doubt uremia as she is compliant with HD and BUN/Cr are close to baseline. Work up per primary team/neurology  ESRD:  On MWF, continue MWF schedule   Hypertension/volume: BP controlled. Mild edema on CXR, UF to EDW   Anemia: Hgb at goal, ESA recently dosed.   Metabolic bone disease: Calcium is elevated, will d/c hectorol for now. Continue sensipar. Last outpatient PTH ws 1665 on 09/21/22.   Nutrition:  Currently NPO, will need renal diet/protein supplements  SaAnice PaganiniPA-C 10/20/2022, 10:12 AM  [facility] Pager: [phone]

## 2022-10-20 NOTE — Progress Notes (Signed)
PROGRESS NOTE    [name]  EXH:[id number] DOB: [birth year] DOA: 10/19/2022 PCP: [name], MD     Brief Narrative:   [name] is a 71 y.o. 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 Patient resides at nursing home, and this morning staff noticed patient became confused unresponsive with slurred speech and right-sided gaze, and called EMS. In the ED, patient developed an episode of foaming mouth and drooling and unable to talk, lasted about 1 minute associated with hypoxia and tachycardia.  Also had some reported twitching like movement of bilateral arms, 1 dose of Ativan push given and patient was also loaded with Keppra.   New events last 24 hours / Subjective: Patient seen in the emergency department.  She is quite somnolent but responds to pain  Assessment & Plan:   Principal Problem:   Acute encephalopathy Active Problems:   Hypotension   ESRD on dialysis [facility])   H/O: CVA (cerebrovascular accident)   Seizure ([place])   Acute metabolic encephalopathy Seizure -Neurology following -CT head: No acute CT finding. Advanced generalized atrophy. Extensive chronic small-vessel ischemic changes throughout the white matter. -EEG pending -MRI pending -CTA head and neck pending  -Loaded with Keppra in the emergency department  ESRD on HD -HD MWF -Nephrology following  Orthostatic hypotension -Midodrine  CAD, hx CVA -Plavix, Lipitor  Abnormal thyroid function test -TSH elevated, T4 also elevated -Repeat labs as outpatient in 4 to 6 weeks    DVT prophylaxis:  heparin injection 5,000 Units Start: 10/20/22 1000  Code Status: Full code Family Communication: No family at bedside Disposition Plan:  Status is: Inpatient Remains  inpatient appropriate because: Encephalopathy  Antimicrobials:  Anti-infectives (From admission, onward)    None        Objective: Vitals:   10/20/22 0830 10/20/22 0905 10/20/22 1000 10/20/22 1201  BP: 130/77  125/71 (!) 128/39  Pulse:   90 92  Resp: 15  15 (!) 22  Temp:  98.2 F (36.8 C) 98.4 F (36.9 C) 98.5 F (36.9 C)  TempSrc:  Oral Axillary Oral  SpO2:   100% 100%    Intake/Output Summary (Last 24 hours) at 10/20/2022 1328 Last data filed at 10/19/2022 2005 Gross per 24 hour  Intake --  Output 1300 ml  Net -1300 ml   Filed Weights    Examination:  General exam: Appears unresponsive to verbal stimuli, responds to pain  Respiratory system: Clear to auscultation. Respiratory effort normal. No respiratory distress.  Cardiovascular system: S1 & S2 heard, RRR. No murmurs. No pedal edema. Gastrointestinal system: Abdomen is nondistended, soft  Extremities: Symmetric in appearance   Data Reviewed: I have personally reviewed following labs and imaging studies  CBC: Recent Labs  Lab 10/19/22 1005 10/19/22 1355  WBC 10.0  --   NEUTROABS 7.0  --   HGB 11.9* 11.6*  HCT 38.9 34.0*  MCV 106.3*  --   PLT 317  --    Basic Metabolic Panel: Recent Labs  Lab 10/19/22 1005 10/19/22 1355  NA 142 135  K 5.4* 4.9  CL 94*  --   CO2 20*  --   GLUCOSE 191*  --   BUN 58*  --   CREATININE 5.97*  --   CALCIUM 11.2*  --   MG 2.7*  --  GFR: Estimated Creatinine Clearance: 6.7 mL/min (A) (by C-G formula based on SCr of 5.97 mg/dL (H)). Liver Function Tests: Recent Labs  Lab 10/19/22 1005  AST 31  ALT 12  ALKPHOS 104  BILITOT 1.4*  PROT 7.0  ALBUMIN 3.2*   Recent Labs  Lab 10/19/22 1005  LIPASE 44   Recent Labs  Lab 10/19/22 1345  AMMONIA 32   Coagulation Profile: No results for input(s): "INR", "PROTIME" in the last 168 hours. Cardiac Enzymes: No results for input(s): "CKTOTAL", "CKMB", "CKMBINDEX", "TROPONINI" in the last 168 hours. BNP (last 3  results) No results for input(s): "PROBNP" in the last 8760 hours. HbA1C: No results for input(s): "HGBA1C" in the last 72 hours. CBG: Recent Labs  Lab 10/19/22 0939 10/20/22 1202  GLUCAP 165* 88   Lipid Profile: No results for input(s): "CHOL", "HDL", "LDLCALC", "TRIG", "CHOLHDL", "LDLDIRECT" in the last 72 hours. Thyroid Function Tests: Recent Labs    10/19/22 1005 10/20/22 0553  TSH 5.044*  --   FREET4  --  1.88*   Anemia Panel: Recent Labs    10/20/22 0553  VITAMINB12 1,016*   Sepsis Labs: No results for input(s): "PROCALCITON", "LATICACIDVEN" in the last 168 hours.  No results found for this or any previous visit (from the past 240 hour(s)).    Radiology Studies: DG Chest 1 View  Result Date: 10/20/2022 CLINICAL DATA:  71 year old female with CHF. EXAM: CHEST  1 VIEW COMPARISON:  Portable chest 10/19/2022 and earlier. FINDINGS: Portable AP supine view at 0551 hours. Stable cardiomegaly and mediastinal contours. Extensive Calcified aortic atherosclerosis. Other mediastinal contours are within normal limits. Large lung volumes, improved lung base ventilation since yesterday. Regressed pulmonary vascularity. No significant pleural effusion. No pneumothorax or consolidation. Stable visualized osseous structures. Visualized tracheal air column is within normal limits. Paucity of bowel gas in the visible upper abdomen. IMPRESSION: 1. Regressed pulmonary edema, larger lung volumes and improved lung base ventilation. 2. Stable cardiomegaly. Aortic Atherosclerosis (ICD10-I70.0). Electronically Signed   By: Genevie Ann M.D.   On: 10/20/2022 06:08   DG Chest Portable 1 View  Result Date: 10/19/2022 CLINICAL DATA:  Altered mental status EXAM: PORTABLE CHEST 1 VIEW COMPARISON:  Chest x-ray September 10, 2022 FINDINGS: Unchanged cardiomediastinal contours including cardiomegaly and tortuous thoracic aorta. Calcified atherosclerosis. Bilateral costophrenic angles are excluded from the field  of view, limiting evaluation of the lungs. Bilateral mild reticular pulmonary opacities. No large pneumothorax or pleural effusion. No acute osseous abnormality. The visualized upper abdomen is unremarkable. IMPRESSION: 1. Bilateral mild reticular pulmonary opacities, favored to represent mild pulmonary edema. 2. Unchanged cardiomegaly. Electronically Signed   By: Beryle Flock M.D.   On: 10/19/2022 11:17   CT Head Wo Contrast  Result Date: 10/19/2022 CLINICAL DATA:  Mental status change of unknown cause. Confusion. Slurred speech. Right gaze. EXAM: CT HEAD WITHOUT CONTRAST TECHNIQUE: Contiguous axial images were obtained from the base of the skull through the vertex without intravenous contrast. RADIATION DOSE REDUCTION: This exam was performed according to the departmental dose-optimization program which includes automated exposure control, adjustment of the mA and/or kV according to patient size and/or use of iterative reconstruction technique. COMPARISON:  06/22/2022 FINDINGS: Brain: Advanced generalized atrophy. Extensive chronic small-vessel ischemic changes throughout the white matter. No sign of acute infarction, mass lesion, hemorrhage, hydrocephalus or extra-axial collection. No change since the prior examination. Vascular: There is atherosclerotic calcification of the major vessels at the base of the brain. Skull: Negative Sinuses/Orbits: Clear/normal Other: None IMPRESSION: No acute  CT finding. Advanced generalized atrophy. Extensive chronic small-vessel ischemic changes throughout the white matter. Electronically Signed   By: Nelson Chimes M.D.   On: 10/19/2022 10:37      Scheduled Meds:  atorvastatin  80 mg Oral QHS   Chlorhexidine Gluconate Cloth  6 each Topical Q0600   Chlorhexidine Gluconate Cloth  6 each Topical Q0600   cinacalcet  30 mg Oral Q M,W,F-HD   clopidogrel  75 mg Oral Daily   escitalopram  10 mg Oral q morning   feeding supplement  237 mL Oral BID BM   heparin  5,000  Units Subcutaneous Q12H   midodrine  10 mg Oral TID WC   mouth rinse  15 mL Mouth Rinse Q2H   pantoprazole  40 mg Oral Daily   sevelamer carbonate  800 mg Oral TID WC   Continuous Infusions:  sodium chloride 75 mL/hr (10/20/22 0904)     LOS: 1 day     Dessa Phi, DO [facility] 10/20/2022, 1:28 PM   Available via Epic secure chat 7am-7pm After these hours, please refer to coverage provider listed on [email or web address]

## 2022-10-20 NOTE — Evaluation (Signed)
Clinical/Bedside Swallow Evaluation Patient Details  Name: [name] MRN: [id number] Date of Birth: 08-15-1951  Today's Date: 10/20/2022 Time: SLP Start Time (ACUTE ONLY): 1138 SLP Stop Time (ACUTE ONLY): 1153 SLP Time Calculation (min) (ACUTE ONLY): 15 min  Past Medical History:  Past Medical History:  Diagnosis Date   Acute ischemic stroke ([place]) 02/23/2017   Acute on chronic diastolic CHF (congestive heart failure) ([place])    Adenomatous polyp of colon 10/2010, 2006, 2015   Anemia in CKD (chronic kidney disease) 11/07/2012   s/p blood transfusion.    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  Had some early rejection then stable function for years, then had slow decline of function and went back on hemodialysis in 2012.  Gets HD TTS schedule at [facility] on [facility] still using L forearm AVF.   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   Past Surgical History:  Past Surgical History:  Procedure Laterality Date   ABDOMINAL AORTOGRAM W/LOWER EXTREMITY N/A 03/26/2022   Procedure: ABDOMINAL AORTOGRAM W/LOWER EXTREMITY;  Surgeon: Marty Heck, MD;  Location: [place] [facility];  Service: Cardiovascular;  Laterality: N/A;  Left   AV FISTULA PLACEMENT     for dialysis   AV FISTULA PLACEMENT Left 11/22/2015   Procedure: ARTERIOVENOUS (AV) FISTULA CREATION-LEFT BRACHIOCEPHALIC;  Surgeon: Serafina Mitchell, MD;   Location: [place];  Service: Vascular;  Laterality: Left;   AV FISTULA PLACEMENT Right 03/15/2020   Procedure: INSERTION OF ARTERIOVENOUS (AV) GORE-TEX GRAFT ARM ( BRACHIAL AXILLARY );  Surgeon: Katha Cabal, MD;  Location: [facility];  Service: Vascular;  Laterality: Right;   AV FISTULA PLACEMENT Right 05/15/2022   Procedure: EXPLORATION  OF  RIGHT ARM ARTERIOVENOUS GORE-TEX GRAFT;  Surgeon: Angelia Mould, MD;  Location: [place];  Service: Vascular;  Laterality: Right;   [place] REMOVAL Right 04/27/2022   Procedure: REVISION OF RIGHT ARTERIOVENOUS GRAFT AND EXCISION OF RIGHT DEGENERATED GRAFT (AVG);  Surgeon: Angelia Mould, MD;  Location: [place];  Service: Vascular;  Laterality: Right;   BACK SURGERY     CERVICAL FUSION     CHOLECYSTECTOMY  12/02/2012   Procedure: LAPAROSCOPIC CHOLECYSTECTOMY WITH INTRAOPERATIVE CHOLANGIOGRAM;  Surgeon: Edward Jolly, MD;  Location: [place];  Service: General;  Laterality: N/A;   EYE SURGERY Bilateral    cataract surgery   EYE SURGERY Left 2019   laser   HEMATOMA EVACUATION Left 12/24/2016   Procedure: EVACUATION HEMATOMA LEFT UPPER ARM;  Surgeon: Waynetta Sandy, MD;  Location: [place];  Service: Vascular;  Laterality: Left;   I & D EXTREMITY Left 12/31/2016   Procedure: IRRIGATION AND DEBRIDEMENT EXTREMITY;  Surgeon: Angelia Mould, MD;  Location: [place];  Service: Vascular;  Laterality: Left;   INSERTION OF DIALYSIS CATHETER Right 12/24/2016   Procedure: INSERTION OF DIALYSIS CATHETER;  Surgeon: Waynetta Sandy, MD;  Location: [place];  Service: Vascular;  Laterality: Right;   INSERTION OF DIALYSIS CATHETER Right 02/04/2017   Procedure: INSERTION OF DIALYSIS CATHETER;  Surgeon: Waynetta Sandy, MD;  Location: [place];  Service: Vascular;  Laterality: Right;   [place] Left 10/23/2016   Procedure: Fistulagram;  Surgeon: Elam Dutch, MD;  Location: [place] [facility];  Service: Cardiovascular;  Laterality: Left;   PERIPHERAL VASCULAR INTERVENTION  03/26/2022   Procedure: PERIPHERAL VASCULAR INTERVENTION;  Surgeon: Marty Heck, MD;  Location: [place] [facility];  Service: Cardiovascular;;  left sfa   PERIPHERAL VASCULAR THROMBECTOMY Right 04/16/2020   Procedure: PERIPHERAL VASCULAR THROMBECTOMY;  Surgeon: Katha Cabal, MD;  Location: [place] [facility];  Service: Cardiovascular;  Laterality: Right;   RESECTION OF ARTERIOVENOUS FISTULA ANEURYSM Left 11/22/2015   Procedure: RESECTION OF LEFT RADIOCEPHALIC FISTULA ANEURYSM ;  Surgeon: Serafina Mitchell, MD;  Location: [place] [facility];  Service: Vascular;  Laterality: Left;   REVISON OF ARTERIOVENOUS FISTULA Left 12/22/2016   Procedure: REVISON OF LEFT ARTERIOVENOUS FISTULA;  Surgeon: Waynetta Sandy, MD;  Location: [place];  Service: Vascular;  Laterality: Left;   REVISON OF ARTERIOVENOUS FISTULA Left 02/04/2017   Procedure: REVISON OF LEFT UPPER ARM ARTERIOVENOUS FISTULA;  Surgeon: Waynetta Sandy, MD;  Location: [place];  Service: Vascular;  Laterality: Left;   UPPER EXTREMITY ANGIOGRAPHY Bilateral 09/19/2019   Procedure: UPPER EXTREMITY ANGIOGRAPHY;  Surgeon: Katha Cabal, MD;  Location: [place] [facility];  Service: Cardiovascular;  Laterality: Bilateral;   HPI:  Pt is a 71 yo female admitted from SNF with AMS. Per neurology consult note, differential dx may include seizures, toxic metabolic encephalopathy, or dementia related cognitive fluctuation. Work up currently underway, with continuous EEG started and MRI pending. Previous swallow eval in August 2021 recommending regular diet and thin liquids but with concern for esophageal component (eructation, c/o pain in her stomach after eating, multiple swallows). 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 She does however open her eyes with ongoing stimulation provided and then responds appropriately to presentation of oral stimulation (toothbrush, spoon). She readily accepts ice chips and small amounts of water via spoon with multiple swallows observed, which may be her baseline compared to SLP evaluation during previous hospital stay. She does have some intermittent coughing though and after a few boluses she starts to seal her lips tightly, no longer accepting boluses. Recommend that she remain NPO for now pending improvements in alertness and mentation. SLP Visit Diagnosis: Dysphagia, unspecified (R13.10)    Aspiration Risk  Moderate aspiration risk;Risk for inadequate nutrition/hydration    Diet Recommendation NPO   Medication Administration: Via alternative means    Other  Recommendations Oral Care Recommendations: Oral care QID Other Recommendations: Have oral suction available    Recommendations for follow up therapy are one component of a multi-disciplinary discharge planning process, led by the attending physician.  Recommendations may be updated based on patient status, additional functional criteria and insurance authorization.  Follow up Recommendations Skilled nursing-short term rehab (<3 hours/day)      Assistance Recommended at Discharge Frequent or constant Supervision/Assistance  Functional Status Assessment Patient has had  a recent decline in their functional status and demonstrates the ability to make significant improvements in function in a reasonable and predictable amount of time.  Frequency and Duration min 2x/week  2 weeks       Prognosis Prognosis for Safe Diet Advancement: Good Barriers to Reach Goals: Cognitive deficits      Swallow Study   General HPI: Pt is a 71 yo female admitted from SNF with AMS. Per neurology  consult note, differential dx may include seizures, toxic metabolic encephalopathy, or dementia related cognitive fluctuation. Work up currently underway, with continuous EEG started and MRI pending. Previous swallow eval in August 2021 recommending regular diet and thin liquids but with concern for esophageal component (eructation, c/o pain in her stomach after eating, multiple swallows). 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, CAD, PAF Type of Study: Bedside Swallow Evaluation Previous Swallow Assessment: see HPI Diet Prior to this Study: NPO Temperature Spikes Noted: No Respiratory Status: Nasal cannula History of Recent Intubation: No Behavior/Cognition: Lethargic/Drowsy;Cooperative;Requires cueing Oral Cavity Assessment: Other (comment) (not able to visualize well) Oral Care Completed by SLP: No Oral Cavity - Dentition:  (able to visualize some dentition but unclear how much) Self-Feeding Abilities: Total assist Patient Positioning: Upright in bed Baseline Vocal Quality: Not observed Volitional Cough: Cognitively unable to elicit Volitional Swallow: Able to elicit    Oral/Motor/Sensory Function Overall Oral Motor/Sensory Function:  (not following commands well to assess)   Ice Chips Ice chips: Impaired Presentation: Spoon Pharyngeal Phase Impairments: Multiple swallows;Cough - Immediate   Thin Liquid Thin Liquid: Impaired Presentation: Spoon Pharyngeal  Phase Impairments: Multiple swallows;Cough - Delayed    Nectar Thick Nectar Thick Liquid: Not tested   Honey Thick Honey Thick Liquid: Not tested   Puree Puree: Not tested   Solid     Solid: Not tested      Osie Bond., M.A. [place] Office [phone]  Secure chat preferred  10/20/2022,1:39 PM

## 2022-10-20 NOTE — ED Notes (Signed)
ED TO INPATIENT HANDOFF REPORT  ED Nurse Name and Phone #: Josh  S Name/Age/Gender [name] 71 y.o. female Room/Bed: 031C/031C  Code Status   Code Status: Full Code  Home/SNF/Other Home Is this baseline? Pt is not responding to me at this time.   Triage Complete: Triage complete  Chief Complaint AMS (altered mental status) [R41.82]  Triage Note Pt bib ems from [facility] with AMS. Unknown LKW. Pt confused, slurred speech and R sided gaze. Pt not following commands, not crossing midline. CBG 200's, 106 palpated, HR 106, 98% RA.    Allergies Allergies  Allergen Reactions   Sulfa Antibiotics Other (See Comments)    Both pt's parents allergic to this type of medication    Adhesive [Tape] Itching    Level of Care/Admitting Diagnosis ED Disposition     ED Disposition  Admit   Condition  --   [place]: [place] [[id number]]  Level of Care: Telemetry Medical [104]  May admit patient to [facility] or [facility] if equivalent level of care is available:: No  Covid Evaluation: Asymptomatic - no recent exposure (last 10 days) testing not required  Diagnosis: AMS (altered mental status) [[id number]]  Admitting Physician: Lequita Halt [[id number]]  Attending Physician: Lequita Halt [[id number]]  Certification:: I certify this patient will need inpatient services for at least 2 midnights  Estimated Length of Stay: 3          B Medical/Surgery History Past Medical History:  Diagnosis Date   Acute ischemic stroke ([place]) 02/23/2017   Acute on chronic diastolic CHF (congestive heart failure) ([place])    Adenomatous polyp of colon 10/2010, 2006, 2015   Anemia in CKD (chronic kidney disease) 11/07/2012   s/p blood transfusion.    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  Had some early rejection then stable function for years, then had slow decline of function and went back on hemodialysis in 2012.  Gets HD TTS schedule at [facility] still using L forearm AVF.   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   Past Surgical History:  Procedure Laterality Date   ABDOMINAL AORTOGRAM W/LOWER EXTREMITY N/A 03/26/2022   Procedure: ABDOMINAL AORTOGRAM W/LOWER EXTREMITY;  Surgeon: Marty Heck, MD;  Location: [place] [facility];  Service: Cardiovascular;  Laterality: N/A;  Left   AV FISTULA PLACEMENT     for dialysis   AV FISTULA PLACEMENT Left 11/22/2015   Procedure: ARTERIOVENOUS (AV) FISTULA CREATION-LEFT BRACHIOCEPHALIC;  Surgeon: Serafina Mitchell, MD;  Location: [place] OR;  Service: Vascular;  Laterality: Left;   AV FISTULA PLACEMENT Right 03/15/2020   Procedure: INSERTION OF ARTERIOVENOUS (AV) GORE-TEX GRAFT ARM ( BRACHIAL AXILLARY );  Surgeon: Katha Cabal, MD;  Location: [facility];  Service: Vascular;  Laterality: Right;  AV FISTULA PLACEMENT Right 05/15/2022   Procedure: EXPLORATION  OF  RIGHT ARM ARTERIOVENOUS GORE-TEX GRAFT;  Surgeon: Angelia Mould, MD;  Location: [place];  Service: Vascular;  Laterality: Right;   [place] REMOVAL Right 04/27/2022   Procedure: REVISION OF RIGHT ARTERIOVENOUS GRAFT AND EXCISION OF RIGHT DEGENERATED GRAFT (AVG);  Surgeon: Angelia Mould, MD;  Location: [place];  Service: Vascular;  Laterality: Right;   BACK SURGERY     CERVICAL FUSION     CHOLECYSTECTOMY  12/02/2012   Procedure: LAPAROSCOPIC  CHOLECYSTECTOMY WITH INTRAOPERATIVE CHOLANGIOGRAM;  Surgeon: Edward Jolly, MD;  Location: [place];  Service: General;  Laterality: N/A;   EYE SURGERY Bilateral    cataract surgery   EYE SURGERY Left 2019   laser   HEMATOMA EVACUATION Left 12/24/2016   Procedure: EVACUATION HEMATOMA LEFT UPPER ARM;  Surgeon: Waynetta Sandy, MD;  Location: [place];  Service: Vascular;  Laterality: Left;   I & D EXTREMITY Left 12/31/2016   Procedure: IRRIGATION AND DEBRIDEMENT EXTREMITY;  Surgeon: Angelia Mould, MD;  Location: [place];  Service: Vascular;  Laterality: Left;   INSERTION OF DIALYSIS CATHETER Right 12/24/2016   Procedure: INSERTION OF DIALYSIS CATHETER;  Surgeon: Waynetta Sandy, MD;  Location: [place];  Service: Vascular;  Laterality: Right;   INSERTION OF DIALYSIS CATHETER Right 02/04/2017   Procedure: INSERTION OF DIALYSIS CATHETER;  Surgeon: Waynetta Sandy, MD;  Location: [place];  Service: Vascular;  Laterality: Right;   [place] Left 10/23/2016   Procedure: Fistulagram;  Surgeon: Elam Dutch, MD;  Location: [place] [facility];  Service: Cardiovascular;  Laterality: Left;   PERIPHERAL VASCULAR INTERVENTION  03/26/2022   Procedure: PERIPHERAL VASCULAR INTERVENTION;  Surgeon: Marty Heck, MD;  Location: [place] [facility];  Service: Cardiovascular;;  left sfa   PERIPHERAL VASCULAR THROMBECTOMY Right 04/16/2020   Procedure: PERIPHERAL VASCULAR THROMBECTOMY;  Surgeon: Katha Cabal, MD;  Location: [place] [facility];  Service: Cardiovascular;  Laterality: Right;   RESECTION OF ARTERIOVENOUS FISTULA ANEURYSM Left 11/22/2015   Procedure: RESECTION OF LEFT RADIOCEPHALIC FISTULA ANEURYSM ;  Surgeon: Serafina Mitchell, MD;  Location: [place] [facility];  Service: Vascular;  Laterality: Left;   REVISON OF ARTERIOVENOUS FISTULA Left 12/22/2016   Procedure: REVISON OF LEFT ARTERIOVENOUS FISTULA;  Surgeon: Waynetta Sandy, MD;  Location: [place];  Service: Vascular;  Laterality: Left;   REVISON OF ARTERIOVENOUS FISTULA Left 02/04/2017   Procedure: REVISON OF LEFT UPPER ARM ARTERIOVENOUS FISTULA;  Surgeon: Waynetta Sandy, MD;  Location: [place];  Service: Vascular;  Laterality: Left;   UPPER EXTREMITY ANGIOGRAPHY Bilateral 09/19/2019   Procedure: UPPER EXTREMITY ANGIOGRAPHY;  Surgeon: Katha Cabal, MD;  Location: [place] [facility];  Service: Cardiovascular;  Laterality: Bilateral;     A IV Location/Drains/Wounds Patient Lines/Drains/Airways Status     Active Line/Drains/Airways     Name Placement date Placement time Site Days   Peripheral IV 10/19/22 20 G 1" Anterior;Left Forearm 10/19/22  1443  Forearm  1   Peripheral IV 10/20/22 22 G Posterior;Right Hand 10/20/22  0906  Hand  less than 1   Fistula / Graft Right Upper arm Arteriovenous vein graft 03/15/20  0850  Upper arm  949   Fistula / Graft Left Arteriovenous vein graft --  --  --  --   Fistula / Graft Right Upper arm Arteriovenous fistula --  --  Upper arm  --  Incision (Closed) 04/27/22 Arm Right 04/27/22  1454  -- 176   Incision (Closed) 05/15/22 Arm Right 05/15/22  1438  -- 158   Pressure Injury 10/27/21 Coccyx Mid Stage 1 -  Intact skin with non-blanchable redness of a localized area usually over a bony prominence. 10/27/21  2200  -- 358   Wound / Incision (Open or Dehisced) 10/27/21 Diabetic ulcer Foot Anterior;Left 10/27/21  2200  Foot  358   Wound / Incision (Open or Dehisced) 05/09/22 (MASD) Moisture Associated Skin Damage Buttocks Right Red, macerated 05/09/22  1358  Buttocks  164            Intake/Output Last 24 hours  Intake/Output Summary (Last 24 hours) at 10/20/2022 0930 Last data filed at 10/19/2022 2005 Gross per 24 hour  Intake --  Output 1300 ml  Net -1300 ml    Labs/Imaging Results for orders placed or performed during the hospital encounter of 10/19/22 (from the past 48 hour(s))  CBG monitoring, ED      Status: Abnormal   Collection Time: 10/19/22  9:39 AM  Result Value Ref Range   Glucose-Capillary 165 (H) 70 - 99 mg/dL    Comment: Glucose reference range applies only to samples taken after fasting for at least 8 hours.  CBC with Differential     Status: Abnormal   Collection Time: 10/19/22 10:05 AM  Result Value Ref Range   WBC 10.0 4.0 - 10.5 K/uL   RBC 3.66 (L) 3.87 - 5.11 MIL/uL   Hemoglobin 11.9 (L) 12.0 - 15.0 g/dL   HCT 38.9 36.0 - 46.0 %   MCV 106.3 (H) 80.0 - 100.0 fL   MCH 32.5 26.0 - 34.0 pg   MCHC 30.6 30.0 - 36.0 g/dL   RDW 18.7 (H) 11.5 - 15.5 %   Platelets 317 150 - 400 K/uL   nRBC 0.3 (H) 0.0 - 0.2 %   Neutrophils Relative % 69 %   Neutro Abs 7.0 1.7 - 7.7 K/uL   Lymphocytes Relative 22 %   Lymphs Abs 2.2 0.7 - 4.0 K/uL   Monocytes Relative 8 %   Monocytes Absolute 0.8 0.1 - 1.0 K/uL   Eosinophils Relative 0 %   Eosinophils Absolute 0.0 0.0 - 0.5 K/uL   Basophils Relative 0 %   Basophils Absolute 0.0 0.0 - 0.1 K/uL   Immature Granulocytes 1 %   Abs Immature Granulocytes 0.08 (H) 0.00 - 0.07 K/uL    Comment: Performed at [facility], [address], [place], [place] [address]  Comprehensive metabolic panel     Status: Abnormal   Collection Time: 10/19/22 10:05 AM  Result Value Ref Range   Sodium 142 135 - 145 mmol/L   Potassium 5.4 (H) 3.5 - 5.1 mmol/L   Chloride 94 (L) 98 - 111 mmol/L   CO2 20 (L) 22 - 32 mmol/L   Glucose, Bld 191 (H) 70 - 99 mg/dL    Comment: Glucose reference range applies only to samples taken after fasting for at least 8 hours.   BUN 58 (H) 8 - 23 mg/dL   Creatinine, Ser 5.97 (H) 0.44 - 1.00 mg/dL   Calcium 11.2 (H) 8.9 - 10.3 mg/dL   Total Protein 7.0 6.5 - 8.1 g/dL   Albumin 3.2 (L) 3.5 - 5.0 g/dL   AST 31 15 - 41 U/L   ALT 12 0 - 44 U/L   Alkaline Phosphatase 104 38 - 126 U/L   Total Bilirubin 1.4 (H) 0.3 -  1.2 mg/dL   GFR, Estimated 7 (L) >60 mL/min    Comment: (NOTE) Calculated using the CKD-EPI Creatinine  Equation (2021)    Anion gap 28 (H) 5 - 15    Comment: Electrolytes repeated to confirm. Performed at [facility], [place] [address], [place], [place] [address]   Lipase, blood     Status: None   Collection Time: 10/19/22 10:05 AM  Result Value Ref Range   Lipase 44 11 - 51 U/L    Comment: Performed at [facility] [address] [place], [place] [address]  Magnesium     Status: Abnormal   Collection Time: 10/19/22 10:05 AM  Result Value Ref Range   Magnesium 2.7 (H) 1.7 - 2.4 mg/dL    Comment: Performed at [facility] [address] [place], [place] [address]  TSH     Status: Abnormal   Collection Time: 10/19/22 10:05 AM  Result Value Ref Range   TSH 5.044 (H) 0.350 - 4.500 uIU/mL    Comment: Performed by a 3rd Generation assay with a functional sensitivity of <=0.01 uIU/mL. Performed at [facility], [place] [address], [place], [place] [address]   Hepatitis B surface antigen     Status: None   Collection Time: 10/19/22  1:45 PM  Result Value Ref Range   Hepatitis B Surface Ag NON REACTIVE NON REACTIVE    Comment: Performed at [facility] [address] [place], [place] [address]  Hepatitis B surface antibody,quantitative     Status: None   Collection Time: 10/19/22  1:45 PM  Result Value Ref Range   Hep B S AB Quant (Post) >1,000.0 Immunity>9.9 mIU/mL    Comment: (NOTE)  Status of Immunity                     Anti-HBs Level  ------------------                     -------------- Inconsistent with Immunity                   0.0 - 9.9 Consistent with Immunity                          >9.9 Performed At: [facility] [place], [place] [id number] Rush Farmer MD IB:[id number]   Ammonia     Status: None   Collection Time: 10/19/22  1:45 PM  Result Value Ref Range   Ammonia 32 9 - 35 umol/L    Comment: Performed at [facility], [place] [address] [place],  [address]  I-Stat venous blood gas, ED     Status:  Abnormal   Collection Time: 10/19/22  1:55 PM  Result Value Ref Range   pH, Ven 7.590 (H) 7.25 - 7.43   pCO2, Ven 28.2 (L) 44 - 60 mmHg   pO2, Ven 129 (H) 32 - 45 mmHg   Bicarbonate 27.1 20.0 - 28.0 mmol/L   TCO2 28 22 - 32 mmol/L   O2 Saturation 99 %   Acid-Base Excess 6.0 (H) 0.0 - 2.0 mmol/L   Sodium 135 135 - 145 mmol/L   Potassium 4.9 3.5 - 5.1 mmol/L   Calcium, Ion 1.00 (L) 1.15 - 1.40 mmol/L   HCT 34.0 (L) 36.0 - 46.0 %   Hemoglobin 11.6 (L) 12.0 - 15.0 g/dL   Sample type VENOUS  DG Chest 1 View  Result Date: 10/20/2022 CLINICAL DATA:  71 year old female with CHF. EXAM: CHEST  1 VIEW COMPARISON:  Portable chest 10/19/2022 and earlier. FINDINGS: Portable AP supine view at 0551 hours. Stable cardiomegaly and mediastinal contours. Extensive Calcified aortic atherosclerosis. Other mediastinal contours are within normal limits. Large lung volumes, improved lung base ventilation since yesterday. Regressed pulmonary vascularity. No significant pleural effusion. No pneumothorax or consolidation. Stable visualized osseous structures. Visualized tracheal air column is within normal limits. Paucity of bowel gas in the visible upper abdomen. IMPRESSION: 1. Regressed pulmonary edema, larger lung volumes and improved lung base ventilation. 2. Stable cardiomegaly. Aortic Atherosclerosis (ICD10-I70.0). Electronically Signed   By: Genevie Ann M.D.   On: 10/20/2022 06:08   DG Chest Portable 1 View  Result Date: 10/19/2022 CLINICAL DATA:  Altered mental status EXAM: PORTABLE CHEST 1 VIEW COMPARISON:  Chest x-ray September 10, 2022 FINDINGS: Unchanged cardiomediastinal contours including cardiomegaly and tortuous thoracic aorta. Calcified atherosclerosis. Bilateral costophrenic angles are excluded from the field of view, limiting evaluation of the lungs. Bilateral mild reticular pulmonary opacities. No large pneumothorax or pleural effusion. No acute osseous abnormality. The visualized upper abdomen is  unremarkable. IMPRESSION: 1. Bilateral mild reticular pulmonary opacities, favored to represent mild pulmonary edema. 2. Unchanged cardiomegaly. Electronically Signed   By: Beryle Flock M.D.   On: 10/19/2022 11:17   CT Head Wo Contrast  Result Date: 10/19/2022 CLINICAL DATA:  Mental status change of unknown cause. Confusion. Slurred speech. Right gaze. EXAM: CT HEAD WITHOUT CONTRAST TECHNIQUE: Contiguous axial images were obtained from the base of the skull through the vertex without intravenous contrast. RADIATION DOSE REDUCTION: This exam was performed according to the departmental dose-optimization program which includes automated exposure control, adjustment of the mA and/or kV according to patient size and/or use of iterative reconstruction technique. COMPARISON:  06/22/2022 FINDINGS: Brain: Advanced generalized atrophy. Extensive chronic small-vessel ischemic changes throughout the white matter. No sign of acute infarction, mass lesion, hemorrhage, hydrocephalus or extra-axial collection. No change since the prior examination. Vascular: There is atherosclerotic calcification of the major vessels at the base of the brain. Skull: Negative Sinuses/Orbits: Clear/normal Other: None IMPRESSION: No acute CT finding. Advanced generalized atrophy. Extensive chronic small-vessel ischemic changes throughout the white matter. Electronically Signed   By: Nelson Chimes M.D.   On: 10/19/2022 10:37    Pending Labs Unresulted Labs (From admission, onward)     Start     Ordered   10/20/22 0734  T4, free  Once,   R        10/20/22 0733   10/20/22 0412  Vitamin B12  Once,   R        10/20/22 0411   10/20/22 0412  Methylmalonic acid, serum  Once,   R        10/20/22 0411   10/20/22 0412  Vitamin B1  Once,   R        10/20/22 0411   10/20/22 0412  HIV Antibody (routine testing w rflx)  (HIV Antibody (Routine testing w reflex) panel)  Once,   R        10/20/22 0411   10/20/22 0412  RPR  Once,   R         10/20/22 0411            Vitals/Pain Today's Vitals   10/20/22 0800 10/20/22 0815 10/20/22 0830 10/20/22 0905  BP: (!) 140/83 (!) 140/76 130/77   Pulse:      Resp:  $'18 12 15   'a$ Temp:    98.2 F (36.8 C)  TempSrc:    Oral  SpO2:      PainSc:        Isolation Precautions No active isolations  Medications Medications  cinacalcet (SENSIPAR) tablet 30 mg (30 mg Oral Not Given 10/19/22 1250)  Chlorhexidine Gluconate Cloth 2 % PADS 6 each (6 each Topical Not Given 10/20/22 0905)  atorvastatin (LIPITOR) tablet 80 mg (has no administration in time range)  midodrine (PROAMATINE) tablet 10 mg (10 mg Oral Not Given 10/20/22 0855)  escitalopram (LEXAPRO) tablet 10 mg (has no administration in time range)  hydrOXYzine (ATARAX) tablet 10 mg (has no administration in time range)  pantoprazole (PROTONIX) EC tablet 40 mg (has no administration in time range)  sevelamer carbonate (RENVELA) tablet 800 mg (0 mg Oral Hold 10/20/22 0856)  clopidogrel (PLAVIX) tablet 75 mg (has no administration in time range)  albuterol (PROVENTIL) (2.5 MG/3ML) 0.083% nebulizer solution 2.5 mg (has no administration in time range)  Oral care mouth rinse (0 mLs Mouth Rinse Hold 10/20/22 0856)  Oral care mouth rinse (has no administration in time range)  0.9 %  sodium chloride infusion (75 mL/hr Intravenous New Bag/Given 10/20/22 0904)  heparin injection 5,000 Units (has no administration in time range)  LORazepam (ATIVAN) injection 4 mg (has no administration in time range)  feeding supplement (ENSURE ENLIVE / ENSURE PLUS) liquid 237 mL (237 mLs Oral Not Given 10/19/22 1335)  LORazepam (ATIVAN) injection 2 mg (has no administration in time range)  levETIRAcetam (KEPPRA) IVPB 1000 mg/100 mL premix (0 mg Intravenous Stopped 10/19/22 1257)  LORazepam (ATIVAN) injection 1 mg (1 mg Intravenous Given 10/19/22 1222)    Mobility non-ambulatory High fall risk   Focused Assessments     R Recommendations: See Admitting  Provider Note  Report given to:   Additional Notes:

## 2022-10-20 NOTE — ED Notes (Addendum)
Patient transferred to ED from in house dialysis. Patient respond to painful stimuli, non-verbal, does not follow commands. VSS. No report called to this Probation officer by dialysis nurse.  Will continue to monitor

## 2022-10-20 NOTE — Progress Notes (Signed)
Pt receives out-pt HD at [facility] on MWF. Will assist as needed.   Theron Cumbie Renal Navigator [phone] 

## 2022-10-20 NOTE — Progress Notes (Signed)
LTM maint complete - no skin breakdown [facility] monitored, Event button test confirmed by [facility]. ? ?

## 2022-10-21 ENCOUNTER — Inpatient Hospital Stay (HOSPITAL_COMMUNITY): Payer: Medicare Other

## 2022-10-21 DIAGNOSIS — G9340 Encephalopathy, unspecified: Secondary | ICD-10-CM | POA: Diagnosis not present

## 2022-10-21 DIAGNOSIS — R569 Unspecified convulsions: Secondary | ICD-10-CM | POA: Diagnosis not present

## 2022-10-21 DIAGNOSIS — R1312 Dysphagia, oropharyngeal phase: Secondary | ICD-10-CM | POA: Diagnosis not present

## 2022-10-21 DIAGNOSIS — L8990 Pressure ulcer of unspecified site, unspecified stage: Secondary | ICD-10-CM | POA: Insufficient documentation

## 2022-10-21 DIAGNOSIS — R946 Abnormal results of thyroid function studies: Secondary | ICD-10-CM

## 2022-10-21 DIAGNOSIS — R4182 Altered mental status, unspecified: Secondary | ICD-10-CM | POA: Diagnosis not present

## 2022-10-21 LAB — GLUCOSE, CAPILLARY
Glucose-Capillary: 72 mg/dL (ref 70–99)
Glucose-Capillary: 70 mg/dL (ref 70–99)
Glucose-Capillary: 59 mg/dL — ABNORMAL LOW (ref 70–99)
Glucose-Capillary: 70 mg/dL (ref 70–99)
Glucose-Capillary: 67 mg/dL — ABNORMAL LOW (ref 70–99)
Glucose-Capillary: 64 mg/dL — ABNORMAL LOW (ref 70–99)

## 2022-10-21 LAB — CBC
HCT: 30 % — ABNORMAL LOW (ref 36.0–46.0)
Hemoglobin: 9.9 g/dL — ABNORMAL LOW (ref 12.0–15.0)
MCH: 32.9 pg (ref 26.0–34.0)
MCHC: 33 g/dL (ref 30.0–36.0)
MCV: 99.7 fL (ref 80.0–100.0)
Platelets: 182 10*3/uL (ref 150–400)
RBC: 3.01 MIL/uL — ABNORMAL LOW (ref 3.87–5.11)
RDW: 17.7 % — ABNORMAL HIGH (ref 11.5–15.5)
WBC: 7.5 10*3/uL (ref 4.0–10.5)
nRBC: 0.3 % — ABNORMAL HIGH (ref 0.0–0.2)

## 2022-10-21 LAB — BASIC METABOLIC PANEL
Anion gap: 13 (ref 5–15)
BUN: 30 mg/dL — ABNORMAL HIGH (ref 8–23)
CO2: 28 mmol/L (ref 22–32)
Calcium: 10.3 mg/dL (ref 8.9–10.3)
Chloride: 98 mmol/L (ref 98–111)
Creatinine, Ser: 4 mg/dL — ABNORMAL HIGH (ref 0.44–1.00)
GFR, Estimated: 11 mL/min — ABNORMAL LOW (ref >60)
Glucose, Bld: 79 mg/dL (ref 70–99)
Potassium: 3.6 mmol/L (ref 3.5–5.1)
Sodium: 139 mmol/L (ref 135–145)

## 2022-10-21 MED ORDER — DEXTROSE-NACL 5-0.45 % IV SOLN: INTRAVENOUS | Status: DC

## 2022-10-21 MED ORDER — RENA-VITE PO TABS
1.0000 | ORAL_TABLET | Freq: Every day | ORAL | Status: DC
  Administered 2022-10-22 – 2022-10-26 (×5): 1 via ORAL
  Filled 2022-10-21 (×5): qty 1

## 2022-10-21 MED ORDER — IOHEXOL 350 MG/ML SOLN
75.0000 mL | Freq: Once | INTRAVENOUS | Status: AC | PRN
  Administered 2022-10-21: 75 mL via INTRAVENOUS

## 2022-10-21 NOTE — Progress Notes (Signed)
Neurology Progress Note  Brief HPI: 71 year old patient with history of end-stage renal disease on dialysis, CHF, anemia, CAD, non-STEMI, GERD, stroke, hypertension and hyperlipidemia presented with acute onset altered mental status.  Patient was in a nursing facility and suddenly became anxious agitated and had hallucinations.  Per family and chart, patient is alert and oriented at baseline, however on presentation she was unable to follow commands or answer questions.  While in the ED yesterday, patient had an episode of foaming at the mouth being unable to speak and drooling which lasted about a minute and included hypoxia and tachycardia.  Per patient's daughter, she has had right gaze deviation for about 3 weeks, but her son states he has not noted this.  She did have right gaze deviation upon presentation.  Subjective: Patient is laying comfortably in bed this morning, eyes open with right gaze preference. She does not track to the left but her oculocephalic reflex is intact. She has unintelligible speech and remains unable to follow commands or answer orientation questions. No further shoulder twitching noted on exam this morning.   EEG 11/7 suggestive of toxic-metabolic etiology, also with severe diffuse encephalopathy of nonspecific etiology. Left shoulder twitching is without concomitant EEG change. LTM discontinued.  MRI brain pending   Exam: Vitals:   10/21/22 0428 10/21/22 0747  BP: (!) 109/52 101/63  Pulse: 81 83  Resp: 18 16  Temp: 98 F (36.7 C) 97.7 F (36.5 C)  SpO2: 100% 100%   Gen: Laying comfortably in bed with eyes open, in no acute distress Resp: non-labored breathing, no acute distress, on room air   Neuro: Mental Status: Patient is awake, laying in bed on initial assessment. She does have some unintelligible vocalizations but does not attempt to answer orientation questions. She does not follow commands.  Cranial Nerves: Pupils midline equal round and reactive, does  not focus or track, she has a right gaze preference at rest, easily overcome with oculocephalic assessment, face appears symmetrical at rest, does not protrude tongue to command.  Motor/Sensory: Withdraws to noxious stimuli equally and briskly throughout. She states "ouch" with noxious stimuli throughout. Does not maintain antigravity position with passive elevation.  DTR: 2+ and symmetric throughout  Gait: Deferred for patient safety  Pertinent Labs:    Latest Ref Rng & Units 10/21/2022    5:01 AM 10/19/2022    1:55 PM 10/19/2022   10:05 AM  CBC  WBC 4.0 - 10.5 K/uL 7.5   10.0   Hemoglobin 12.0 - 15.0 g/dL 9.9  11.6  11.9   Hematocrit 36.0 - 46.0 % 30.0  34.0  38.9   Platelets 150 - 400 K/uL 182   317        Latest Ref Rng & Units 10/21/2022    5:01 AM 10/19/2022    1:55 PM 10/19/2022   10:05 AM  BMP  Glucose 70 - 99 mg/dL 79   191   BUN 8 - 23 mg/dL 30   58   Creatinine 0.44 - 1.00 mg/dL 4.00   5.97   Sodium 135 - 145 mmol/L 139  135  142   Potassium 3.5 - 5.1 mmol/L 3.6  4.9  5.4   Chloride 98 - 111 mmol/L 98   94   CO2 22 - 32 mmol/L 28   20   Calcium 8.9 - 10.3 mg/dL 10.3   11.2    RPR Non reactive HIV Non reactive B1 pending MMA pending B12 1016 Ammonia 32 TSH 5.044  Imaging Reviewed:  CT head: No acute abnormalities  MRI brain: Pending  Routine EEG 11/7: "This study showed generalized periodic discharges with triphasic morphology which can get the ictal-interictal continuum.  However the frequency and morphology is more commonly indicative of toxic-metabolic causes.  Additionally there was moderate to severe diffuse encephalopathy, nonspecific etiology.   Patient was noted to have left shoulder twitching in the area over the dialysis fistula without concomitant EEG change.  This is most likely not an epileptic event."  Overnight EEG 11/7 - 11/8:  This study showed generalized periodic discharges with triphasic morphology which are most likely due to  toxic-metabolic causes due to the morphology, frequency and reactivity to stimulation. Additionally there was moderate to severe diffuse encephalopathy, nonspecific etiology.  No seizures were seen during the study.   Assessment: 71 year old patient with history of ESRD, CHF, anemia, CAD, non-STEMI, GERD, stroke, HTN and HLD presented with acute onset AMS with questionable seizure in the ED.  Patient's mental status has remained altered with patient unable to follow commands or answer questions.  Patient was completely nonverbal but did respond to noxious stimuli.  She did have a right gaze deviation upon presentation, which has been present for 3 weeks per family, but she does not have this right gaze deviation on initial assessment.  Initial CTH was without acute findings and CTA imaging was non-diagnostic due to contrast extravasation. Routine EEG suggestive of toxic-metabolic causes and severe diffuse encephalopathy, as above. Will obtain MRI brain to rule out stroke or other cause of encephalopathy. Altered mental status labs ordered and awaiting results.   Due to episode of foaming at the mouth, drooling, inability to speak for 1 minute witnessed while the patient was in the ED, her initial presentation was felt to be concerning for a focal seizure. Despite no seizures or epileptiform discharges initial EEG, she was started on Keppra 500 mg BID with an additional 250 mg following HD.   Impression: Altered mental status, likely toxic metabolic encephalopathy in patient with multiple comorbidities  Recommendations: 1) cEEG 2) brain MRI after EEG. 3) await results of RPR, HIV, MMA, thiamine, B12 4) search for and correction of causes of toxic metabolic encephalopathy per primary team 5) neurology will continue to follow  Rikki Spearing , MSN, AGACNP-BC [facility] See Amion for schedule and pager information 10/21/2022 8:42 AM  NEUROHOSPITALIST ADDENDUM Performed a face to face  diagnostic evaluation.   I have reviewed the contents of history and physical exam as documented by PA/ARNP/Resident and agree with above documentation.  I have discussed and formulated the above plan as documented. Edits to the note have been made as needed.  Donnetta Simpers, MD [facility] [phone]   If 7pm to 7am, please call on call as listed on AMION.

## 2022-10-21 NOTE — Progress Notes (Signed)
Patient ID: [name], female   DOB: October 14, 1951, 71 y.o.   MRN: [id number] [facility] Progress Note   Assessment/ Plan:   1.  Altered mental status: With earlier evidence of seizure like activity for which she underwent neurology evaluation and EEG that showed generalized triphasic morphology discharges consistent with nonspecific moderate to severe diffuse metabolic encephalopathy. 2. ESRD: She is usually on a Monday/Wednesday/Friday dialysis schedule and has orders to undergo hemodialysis today. 3. Anemia: Downtrending hemoglobin and hematocrit noted without any overt losses, 4. CKD-MBD: Earlier hypercalcemia noted for which Hectorol was appropriately discontinued.  Continue Cinacalcet for PTH control and potentially mitigation of hypercalcemia. 5. Nutrition: N.p.o. at this time with concerns of encephalopathy and risk of aspiration.  Periodic reevaluation by physical therapy/speech therapy ongoing. 6. Hypotension: Blood pressure under decent control with ongoing midodrine.  Ultrafiltration somewhat limited with marginal hypotension.  Subjective:   Without acute events overnight, EEG undertaken yesterday.   Objective:   BP 101/63 (BP Location: Right Leg)   Pulse 83   Temp 97.7 F (36.5 C) (Oral)   Resp 16   SpO2 100%   Physical Exam: Gen: Somnolent, lethargic, awakens briefly to constant/painful stimulation. CVS: Pulse regular rhythm, normal rate, S1 and S2 normal Resp: Clear to auscultation bilaterally, no distinct rales or rhonchi Abd: Soft, scaphoid, bowel sounds normal Ext: No lower extremity edema.  Hyper pulsatile left upper arm AV fistula with distended cephalic vein to her chest.  Labs: BMET Recent Labs  Lab 10/19/22 1005 10/19/22 1355 10/21/22 0501  NA 142 135 139  K 5.4* 4.9 3.6  CL 94*  --  98  CO2 20*  --  28  GLUCOSE 191*  --  79  BUN 58*  --  30*  CREATININE 5.97*  --  4.00*  CALCIUM 11.2*  --  10.3   CBC Recent Labs  Lab  10/19/22 1005 10/19/22 1355 10/21/22 0501  WBC 10.0  --  7.5  NEUTROABS 7.0  --   --   HGB 11.9* 11.6* 9.9*  HCT 38.9 34.0* 30.0*  MCV 106.3*  --  99.7  PLT 317  --  182      Medications:     atorvastatin  80 mg Oral QHS   Chlorhexidine Gluconate Cloth  6 each Topical Q0600   Chlorhexidine Gluconate Cloth  6 each Topical Q0600   cinacalcet  30 mg Oral Q M,W,F-HD   clopidogrel  75 mg Oral Daily   escitalopram  10 mg Oral q morning   feeding supplement  237 mL Oral BID BM   heparin  5,000 Units Subcutaneous Q12H   midodrine  10 mg Oral TID WC   mouth rinse  15 mL Mouth Rinse Q2H   pantoprazole  40 mg Oral Daily   sevelamer carbonate  800 mg Oral TID WC   Elmarie Shiley, MD 10/21/2022, 9:00 AM

## 2022-10-21 NOTE — Procedures (Signed)
Patient Name: [name]  MRN: [id number]  Epilepsy Attending: Lora Havens  Referring Physician/Provider: Donnetta Simpers, MD  Duration: 10/20/2022 1000 to 10/21/2022 6168   Patient history: 71 year old patient with end-stage renal disease hypertension hyperlipidemia CAD and previous stroke presents with acute onset confusion and hallucinations. EEG to evaluate for seizure   Level of alertness: lethargic, asleep   AEDs during EEG study: LEV   Technical aspects: This EEG study was done with scalp electrodes positioned according to the 10-20 International system of electrode placement. Electrical activity was reviewed with band pass filter of 1-'70Hz'$ , sensitivity of 7 uV/mm, display speed of 76m/sec with a '60Hz'$  notched filter applied as appropriate. EEG data were recorded continuously and digitally stored.  Video monitoring was available and reviewed as appropriate.   Description: During awake state, no clear posterior dominant rhythm was seen. Sleep was characterized by sleep spindles (12-14), maximal frontocentral region. EEG showed continuous generalized 3 to 6 Hz theta-delta slowing.  Generalized periodic discharges with triphasic morphology at '1hz'$  were also noted, more frequent when awake/stimulated.  Hyperventilation and photic stimulation were not performed.      ABNORMALITY - Periodic discharges with triphasic morphology, generalized ( GPDs) - Continuous slow, generalized   IMPRESSION: This study showed generalized periodic discharges with triphasic morphology which are most likely due to toxic-metabolic causes due to the morphology, frequency and reactivity to stimulation. Additionally there was moderate to severe diffuse encephalopathy, nonspecific etiology.  No seizures were seen during the study.   Laurelin Elson OBarbra Sarks

## 2022-10-21 NOTE — Progress Notes (Addendum)
[facility] PROGRESS NOTE   [name] WUX:[id number] DOB: 07/01/1951 DOA: 10/19/2022  PCP: [name], MD  Brief History/Interval Summary: 71 y.o. 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 Patient resides at nursing home, and this morning staff noticed patient became confused unresponsive with slurred speech and right-sided gaze, and called EMS. In the ED, patient developed an episode of foaming mouth and drooling and unable to talk, lasted about 1 minute associated with hypoxia and tachycardia.  Also had some reported twitching like movement of bilateral arms, 1 dose of Ativan push given and patient was also loaded with Keppra.     Consultants: Neurology  Procedures: Continuous EEG    Subjective/Interval History: Patient not very communicative.  Does open her eyes and look at me but does not answer my questions appropriately.    Assessment/Plan:  Acute metabolic encephalopathy/Concern for seizure activity Neurology is following.  CT head did not show any acute findings.  MRI brain and CT angiogram head and neck is pending. Patient is on continuous EEG.  Patient was loaded with Keppra in the emergency department. Remains on Keppra for now.  End-stage renal disease on hemodialysis Dialyzed on Monday Wednesday Friday schedule. Nephrology is following.  Hypoglycemia Low glucose levels likely due to poor oral intake.  We will switch her to D5 infusion for now.  Speech therapy has seen the patient.  Will initiate pureed Diet with full aspiration precautions.  Orthostatic hypotension Noted to be on midodrine.  History of coronary artery disease/history of stroke Noted to be on Lipitor, Plavix. Patient does have left-sided deficits  from previous stroke.  Abnormal thyroid function test TSH is elevated.  Free T4 was also noted to be elevated. No known history of thyroid disease.  Recommend that these levels be rechecked in 4 to 6 weeks.   DVT Prophylaxis: Subcutaneous heparin Code Status: Full code Family Communication: No family at bedside Disposition Plan: Here from skilled nursing facility.  Anticipate discharge to SNF when medically stable.  Status is: Inpatient Remains inpatient appropriate because: Altered mental status, concern for seizure activity      Medications: Scheduled:  atorvastatin  80 mg Oral QHS   Chlorhexidine Gluconate Cloth  6 each Topical Q0600   Chlorhexidine Gluconate Cloth  6 each Topical Q0600   cinacalcet  30 mg Oral Q M,W,F-HD   clopidogrel  75 mg Oral Daily   escitalopram  10 mg Oral q morning   feeding supplement  237 mL Oral BID BM   heparin  5,000 Units Subcutaneous Q12H   midodrine  10 mg Oral TID WC   mouth rinse  15 mL Mouth Rinse Q2H   pantoprazole  40 mg Oral Daily   sevelamer carbonate  800 mg Oral TID WC   Continuous:  dextrose 5 % and 0.45% NaCl     levETIRAcetam     levETIRAcetam 500 mg (10/20/22 1831)   OZD:GUYQIHKVQ, hydrOXYzine, LORazepam, LORazepam, mouth rinse  Antibiotics: Anti-infectives (From admission, onward)    None       Objective:  Vital Signs  Vitals:   10/20/22 1946 10/20/22 2342 10/21/22 0428 10/21/22 0747  BP: 138/63 (!) 102/53 (!) 109/52 101/63  Pulse: 95 84 81 83  Resp: '17 18 18 16  '$ Temp: 98.6 F (37 C) 98 F (36.7 C)  98 F (36.7 C) 97.7 F (36.5 C)  TempSrc: Axillary Oral Oral Oral  SpO2: 96% 97% 100% 100%    Intake/Output Summary (Last 24 hours) at 10/21/2022 1051 Last data filed at 10/24/2022 1700 Gross per 24 hour  Intake --  Output 450 ml  Net -450 ml   Filed Weights    General appearance: Awake alert.  In no distress.  Distracted. Resp: Clear to auscultation bilaterally.  Normal effort Cardio: S1-S2 is  normal regular.  No S3-S4.  No rubs murmurs or bruit GI: Abdomen is soft.  Nontender nondistended.  Bowel sounds are present normal.  No masses organomegaly Extremities: No edema.   Neurologic: Left-sided deficits noted.   Lab Results:  Data Reviewed: I have personally reviewed following labs and reports of the imaging studies  CBC: Recent Labs  Lab 10/19/22 1005 10/19/22 1355 10/21/22 0501  WBC 10.0  --  7.5  NEUTROABS 7.0  --   --   HGB 11.9* 11.6* 9.9*  HCT 38.9 34.0* 30.0*  MCV 106.3*  --  99.7  PLT 317  --  270    Basic Metabolic Panel: Recent Labs  Lab 10/19/22 1005 10/19/22 1355 10/21/22 0501  NA 142 135 139  K 5.4* 4.9 3.6  CL 94*  --  98  CO2 20*  --  28  GLUCOSE 191*  --  79  BUN 58*  --  30*  CREATININE 5.97*  --  4.00*  CALCIUM 11.2*  --  10.3  MG 2.7*  --   --     GFR: Estimated Creatinine Clearance: 10 mL/min (A) (by C-G formula based on SCr of 4 mg/dL (H)).  Liver Function Tests: Recent Labs  Lab 10/19/22 1005  AST 31  ALT 12  ALKPHOS 104  BILITOT 1.4*  PROT 7.0  ALBUMIN 3.2*    Recent Labs  Lab 10/19/22 1005  LIPASE 44   Recent Labs  Lab 10/19/22 1345  AMMONIA 32    CBG: Recent Labs  Lab 2022-10-24 1644 10/21/22 0504 10/21/22 0624 10/21/22 0752 10/21/22 0755  GLUCAP 104* 72 70 59* 70    Thyroid Function Tests: Recent Labs    10/19/22 1005 2022-10-24 0553  TSH 5.044*  --   FREET4  --  1.88*    Anemia Panel: Recent Labs    10/24/2022 0553  VITAMINB12 1,016*    Radiology Studies: EEG adult  Result Date: 10/24/22 Lora Havens, MD     October 24, 2022  1:36 PM Patient Name: [name] MRN: [id number] Epilepsy Attending: Lora Havens Referring Physician/Provider: Donnetta Simpers, MD Date: 10-24-2022 Duration: 33.05 mins Patient history: 70 year old patient with end-stage renal disease hypertension hyperlipidemia CAD and previous stroke presents with acute onset confusion and hallucinations. EEG to  evaluate for seizure Level of alertness: lethargic AEDs during EEG study: None Technical aspects: This EEG study was done with scalp electrodes positioned according to the 10-20 International system of electrode placement. Electrical activity was reviewed with band pass filter of 1-'70Hz'$ , sensitivity of 7 uV/mm, display speed of 66m/sec with a '60Hz'$  notched filter applied as appropriate. EEG data were recorded continuously and digitally stored.  Video monitoring was available and reviewed as appropriate. Description: EEG showed continuous generalized 3 to 6 Hz theta Pal[place] slowing.  Generalized periodic discharges with triphasic morphology at '1hz'$  were also noted.  Hyperventilation and photic stimulation were not performed.   Patient was noted to have left shoulder twitching in the area over the dialysis fistula.  Concomitant EEG  before, during and after the event did not show any EEG changes suggest seizure. ABNORMALITY - Periodic discharges with triphasic morphology, generalized ( GPDs) - Continuous slow, generalized IMPRESSION: This study showed generalized periodic discharges with triphasic morphology which can get the ictal-interictal continuum.  However the frequency and morphology is more commonly indicative of toxic-metabolic causes.  Additionally there was moderate to severe diffuse encephalopathy, nonspecific etiology. Patient was noted to have left shoulder twitching in the area over the dialysis fistula without concomitant EEG change.  This is most likely not an epileptic event.  Lora Havens   DG Chest 1 View  Result Date: 10/20/2022 CLINICAL DATA:  71 year old female with CHF. EXAM: CHEST  1 VIEW COMPARISON:  Portable chest 10/19/2022 and earlier. FINDINGS: Portable AP supine view at 0551 hours. Stable cardiomegaly and mediastinal contours. Extensive Calcified aortic atherosclerosis. Other mediastinal contours are within normal limits. Large lung volumes, improved lung base ventilation since  yesterday. Regressed pulmonary vascularity. No significant pleural effusion. No pneumothorax or consolidation. Stable visualized osseous structures. Visualized tracheal air column is within normal limits. Paucity of bowel gas in the visible upper abdomen. IMPRESSION: 1. Regressed pulmonary edema, larger lung volumes and improved lung base ventilation. 2. Stable cardiomegaly. Aortic Atherosclerosis (ICD10-I70.0). Electronically Signed   By: Genevie Ann M.D.   On: 10/20/2022 06:08   DG Chest Portable 1 View  Result Date: 10/19/2022 CLINICAL DATA:  Altered mental status EXAM: PORTABLE CHEST 1 VIEW COMPARISON:  Chest x-ray September 10, 2022 FINDINGS: Unchanged cardiomediastinal contours including cardiomegaly and tortuous thoracic aorta. Calcified atherosclerosis. Bilateral costophrenic angles are excluded from the field of view, limiting evaluation of the lungs. Bilateral mild reticular pulmonary opacities. No large pneumothorax or pleural effusion. No acute osseous abnormality. The visualized upper abdomen is unremarkable. IMPRESSION: 1. Bilateral mild reticular pulmonary opacities, favored to represent mild pulmonary edema. 2. Unchanged cardiomegaly. Electronically Signed   By: Beryle Flock M.D.   On: 10/19/2022 11:17       LOS: 2 days   [facility] Pager on [email or web address]  10/21/2022, 10:51 AM

## 2022-10-21 NOTE — Progress Notes (Signed)
LTM EEG disconnected - no skin breakdown at [facility] notified of DC

## 2022-10-21 NOTE — Progress Notes (Signed)
NEUROLOGY CONSULTATION PROGRESS NOTE   Date of service: October 21, 2022 Patient Name: [name] MRN:  [id number] DOB:  1951-02-21  Brief HPI  [name] is a 71 year old patient with history of end-stage renal disease on dialysis, CHF, anemia, CAD, non-STEMI, GERD, stroke, hypertension and hyperlipidemia presented with acute onset altered mental status.  Patient was in a nursing facility and suddenly became anxious agitated and had hallucinations.  Per family and chart, patient is alert and oriented at baseline, however on presentation she was unable to follow commands or answer questions.  While in the ED yesterday, patient had an episode of foaming at the mouth being unable to speak and drooling which lasted about a minute and included hypoxia and tachycardia.  Per patient's daughter, she has had right gaze deviation for about 3 weeks, but her son states he has not noted this.  She did have right gaze deviation upon presentation to the ED.  Interval Hx   A bit more awake but still not very interactive. EEG with GPD '1Hz'$ , likely toxic metabolic, no epileptiform discharges, no seizures.  Vitals   Vitals:   10/21/22 1330 10/21/22 1400 10/21/22 1530 10/21/22 1546  BP: (!) 93/36 (!) 80/68 (!) 111/33 (!) 98/44  Pulse: 92 90 84 86  Resp: '14 20 13 20  '$ Temp:      TempSrc:      SpO2:      Weight:         Body mass index is 17.1 kg/m.  Physical Exam   General: Laying comfortably in bed; in no acute distress.  HENT: Normal oropharynx and mucosa. Normal external appearance of ears and nose.  Neck: Supple, no pain or tenderness  CV: No JVD. No peripheral edema.  Pulmonary: Symmetric Chest rise. Normal respiratory effort.  Abdomen: Soft to touch, non-tender.  Ext: No cyanosis, edema, or deformity  Skin: No rash. Normal palpation of skin.   Musculoskeletal: Normal digits and nails by inspection. No clubbing.   Neurologic Examination  Mental status/Cognition: opens eyes to  vigorous tactile stimulation but does not follow commands. ? makes brief eye contact. Speech/language: mute, no speech today. Cranial nerves:   CN II Pupils equal and reactive to light, no VF deficits   CN III,IV,VI Prefers to look to the right but head is resting on her left shoulder.   CN V corneals intact BL   CN VII Symmetric facial grimace   CN VIII Make brief eye contact to loud voice,.   CN IX & X Unabel to asssess   CN XI Head tilted onto left shoulder. When brought to midline, it tils back onto the left shoulder.   CN XII midline tongue but does not protrude on command.   Sensory/Motor:  Muscle bulk: poor, tone normal Localizes to pinch in all extremities.  Coordination/Complex Motor:  Unable to assess - Gait: deferred for patient safety.  Labs   Basic Metabolic Panel:  Lab Results  Component Value Date   NA 139 10/21/2022   K 3.6 10/21/2022   CO2 28 10/21/2022   GLUCOSE 79 10/21/2022   BUN 30 (H) 10/21/2022   CREATININE 4.00 (H) 10/21/2022   CALCIUM 10.3 10/21/2022   GFRNONAA 11 (L) 10/21/2022   GFRAA 7 (L) 07/26/2020   HbA1c:  Lab Results  Component Value Date   HGBA1C 5.5 09/10/2022   LDL:  Lab Results  Component Value Date   LDLCALC 117 (H) 02/22/2021   Urine Drug Screen: No results found for: "  LABOPIA", "COCAINSCRNUR", "LABBENZ", "AMPHETMU", "THCU", "LABBARB"  Alcohol Level     Component Value Date/Time   ETH <10 02/21/2021 2154   No results found for: "PHENYTOIN", "ZONISAMIDE", "LAMOTRIGINE", "LEVETIRACETA" No results found for: "PHENYTOIN", "PHENOBARB", "VALPROATE", "CBMZ"  Imaging and Diagnostic studies  CT Head w/o contrast: No acute CT finding. Advanced generalized atrophy. Extensive chronic small-vessel ischemic changes throughout the white matter.  LTM EEG: GPD with triphasic, likely toxic metabolic.  Impression   [name] is a 80F with ESRD on HD, stroke with residual L sided weakness, pAF not on AC p/w acute  encephalopathy +  hallucinations + agitation. Also reported hx of R gaze deviation for 3 weeks per family. In the ED, had episode of foaming mouth + drooling and inability to speak for about 1 minute. She was noted to have R gaze deviation on initial neuro eval along with no speech.   She was outside tnkase window, CT Angio non diagnostic. She was outside window for thrombectomy.   LTM EEG with no seizures, no epileptiform discharges. Plan to get MRI brain w/o contrast (no contrast 2/2 HD) for further evaluation.  Given head tilting onto the left shoulder, her R gaze may just be more of a attempt to compensate for the tilt to keep looking straight.  High suspicion that current presentation is likely toxic metabolic encephalopathy.  Recommendations  - encephalopathy labs are non revealing. - MRI Brain is pending. - LTM discontinued. - Initially started on Keppra but discontinued it given negative EEG and low overall suspicion for seizure. ______________________________________________________________________   Thank you for the opportunity to take part in the care of this patient. If you have any further questions, please contact the neurology consultation attending.  Signed,  [place] Pager Number [phone]

## 2022-10-21 NOTE — Progress Notes (Addendum)
Speech Language Pathology Treatment: Dysphagia  Patient Details Name: [name] MRN: [id number] DOB: 03/05/1951 Today's Date: 10/21/2022 Time: 7654-6503 SLP Time Calculation (min) (ACUTE ONLY): 26 min  Assessment / Plan / Recommendation Clinical Impression  Pt was sleeping in bed upon arrival of SLP. She was repositioned to upright with staff assistance. Pt tolerated oral care with suction. There were 2 small pieces of dry skin on her lips that were not removed easily with suction swab or gentle wiping with washcloth, so they were left in place. Pt continued to mess with them with her tongue to no avail.  Following oral care, pt accepted trials of ice chips, thin liquid, nectar thick liquid, and puree. Extended oral prep noted, likely due to limited dentition. Audible swallow was also noted. Delayed cough was noted after thin liquid trials, but no cough response was elicited following nectar thick liquids or puree textures. Pt continued to keep her eyes closed throughout this session, but was accepting of all boluses presented.   Recommend initiating puree diet with nectar thick liquids ONLY WHEN ALERT and UPRIGHT. Pt required total assist and full supervision with PO intake at this time, and should be fed SMALL BITES/SIPS at a SLOW RATE. Caution should be used with all PO trials, as her alertness and therefore appropriateness for PO intake is anticipated to be highly variable. Pt is also not anticipated to meet nutritional needs at this time, so dietician referral would be beneficial to recommend appropriate supplements.   SLP will continue to follow to assess diet tolerance, readiness to advance textures, and/or need for instrumental assessment. RN and MD aware.    HPI HPI: Pt is a 71 yo female admitted from SNF with AMS. Per neurology consult note, differential dx may include seizures, toxic metabolic encephalopathy, or dementia related cognitive fluctuation. Work up currently underway,  with continuous EEG started and MRI pending. Previous swallow eval in August 2021 recommending regular diet and thin liquids but with concern for esophageal component (eructation, c/o pain in her stomach after eating, multiple swallows). 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  Recommendations may be updated based on patient status, additional functional criteria and insurance authorization.    Recommendations  Diet recommendations: Dysphagia 1 (puree);Nectar-thick liquid Liquids provided via: Straw;Cup Medication Administration: Crushed with puree Supervision: Full supervision/cueing for compensatory strategies;Trained caregiver to feed patient Compensations: Minimize environmental distractions;Slow rate;Small sips/bites Postural Changes and/or Swallow Maneuvers: Seated upright 90 degrees;Upright 30-60 min after meal                Oral Care Recommendations: Oral care QID Follow Up Recommendations: Skilled nursing-short term rehab (<3 hours/day) Assistance recommended at discharge: Frequent or constant Supervision/Assistance SLP Visit Diagnosis: Dysphagia, unspecified (R13.10) Plan: Continue with current plan of care         Tylerjames Hoglund B. Quentin Ore, [facility], [place] Speech Language Pathologist Office: [phone]  Shonna Chock 10/21/2022, 9:28 AM

## 2022-10-21 NOTE — Progress Notes (Signed)
Initial Nutrition Assessment  DOCUMENTATION CODES:  Underweight, Severe malnutrition in context of chronic illness  INTERVENTION:  Continue current diet as ordered per SLP  Feeding assistance Magic cup TID with meals, each supplement provides 290 kcal and 9 grams of protein Nectar Thick Mighty Shake TID to provide 330kcal and 9g of protein  NUTRITION DIAGNOSIS:   Severe Malnutrition related to chronic illness (ESRD on HD, CHF) as evidenced by percent weight loss, severe muscle depletion, severe fat depletion (12% x 6 months).  GOAL:   Patient will meet greater than or equal to 90% of their needs  MONITOR:   PO intake, Supplement acceptance, Labs  REASON FOR ASSESSMENT:   Consult Assessment of nutrition requirement/status  ASSESSMENT:  Pt with hx of ESRD on HD, hx CVA, CAD, CHF, HTN, HLD, GERD and osteoporosis presented to ED from nursing facility with AMS.  11/8 - SLP evaluation, DYS 1 with nectar thick liquids  Pt resting in bed at the time of assessment. Does not interact or provide a nutrition hx. Noted that SLP worked with pt this AM and placed diet order only to be offered if pt is alert and upright. SLP expressed concerns with pt's ability to meet nutrition needs.   On exam, pt is very thin with obvious loss of fat and muscle stores. 12% weight loss noted over the last 6 months which is severe. Discussed with MD and approached the idea of a cortrak tube this week if pt's mental status does not improve as pt's current scope of care is all aggressive measures. Pt scheduled to have HD this afternoon. Will add supplements in the meantime.   Nutritionally Relevant Medications: Scheduled Meds:  atorvastatin  80 mg Oral QHS   cinacalcet  30 mg Oral Q M,W,F-HD   Ensure Enlive  237 mL Oral BID BM   pantoprazole  40 mg Oral Daily   sevelamer carbonate  800 mg Oral TID WC   Labs Reviewed: BUN 30, creatinine 4  NUTRITION - FOCUSED PHYSICAL EXAM: Flowsheet Row Most Recent  Value  Orbital Region Moderate depletion  Upper Arm Region Severe depletion  Thoracic and Lumbar Region Severe depletion  Buccal Region Moderate depletion  Temple Region Mild depletion  Clavicle Bone Region Moderate depletion  Clavicle and Acromion Bone Region Severe depletion  Scapular Bone Region Severe depletion  Dorsal Hand Moderate depletion  Patellar Region Severe depletion  Anterior Thigh Region Severe depletion  Posterior Calf Region Severe depletion  Edema (RD Assessment) None  Hair Reviewed  Eyes Reviewed  Mouth Reviewed  Skin Reviewed  [pale nail beds]  Nails Reviewed   Diet Order:   Diet Order             DIET - DYS 1 Room service appropriate? Yes; Fluid consistency: Thin  Diet effective now                   EDUCATION NEEDS:  Not appropriate for education at this time  Skin:  Skin Assessment: Skin Integrity Issues: (stage 2, left ankle)  Last BM:  11/7 - type 6  Height:  Ht Readings from Last 1 Encounters:  10/13/22 '5\' 2"'$  (1.575 m)    Weight:  Wt Readings from Last 1 Encounters:  10/21/22 42.4 kg    Ideal Body Weight:  50 kg  BMI:  Body mass index is 17.1 kg/m.  Estimated Nutritional Needs:  Kcal:  1500-1700 kcal/d Protein:  75-90g/d Fluid:  1L+UOP   Ranell Patrick, RD, LDN Clinical Dietitian  RD pager # available in [facility]  After hours/weekend pager # available in [facility]

## 2022-10-21 NOTE — Progress Notes (Signed)
Received patient in bed to unit.  Alert and oriented.  Informed consent signed and in chart.   Treatment initiated: 1128 Treatment completed: 1358  Patient tolerated well.  Transported back to the room  Alert, without acute distress.  Hand-off given to patient's nurse.   Access used: AVG  Access issues: Pt had prolong bleeding 20 mins,  Total UF removed: 0 Medication(s) given: Midodrine 10 mg PO Post HD VS: 130/60 P94 R 20  Post HD weight: 42.4 KG   [facility]

## 2022-10-22 DIAGNOSIS — R569 Unspecified convulsions: Secondary | ICD-10-CM | POA: Diagnosis not present

## 2022-10-22 DIAGNOSIS — R946 Abnormal results of thyroid function studies: Secondary | ICD-10-CM | POA: Diagnosis not present

## 2022-10-22 DIAGNOSIS — G9340 Encephalopathy, unspecified: Secondary | ICD-10-CM | POA: Diagnosis not present

## 2022-10-22 LAB — GLUCOSE, CAPILLARY
Glucose-Capillary: 149 mg/dL — ABNORMAL HIGH (ref 70–99)
Glucose-Capillary: 159 mg/dL — ABNORMAL HIGH (ref 70–99)
Glucose-Capillary: 200 mg/dL — ABNORMAL HIGH (ref 70–99)
Glucose-Capillary: 65 mg/dL — ABNORMAL LOW (ref 70–99)
Glucose-Capillary: 66 mg/dL — ABNORMAL LOW (ref 70–99)
Glucose-Capillary: 68 mg/dL — ABNORMAL LOW (ref 70–99)
Glucose-Capillary: 96 mg/dL (ref 70–99)

## 2022-10-22 MED ORDER — PANTOPRAZOLE SODIUM 40 MG IV SOLR
40.0000 mg | INTRAVENOUS | Status: DC
  Administered 2022-10-22 – 2022-10-25 (×3): 40 mg via INTRAVENOUS
  Filled 2022-10-22 (×3): qty 10

## 2022-10-22 MED ORDER — THIAMINE HCL 100 MG PO TABS
100.0000 mg | ORAL_TABLET | Freq: Every day | ORAL | Status: DC
  Administered 2022-10-22 – 2022-10-27 (×6): 100 mg via ORAL
  Filled 2022-10-22 (×11): qty 1

## 2022-10-22 MED ORDER — THIAMINE HCL 100 MG/ML IJ SOLN: 100.0000 mg | Freq: Every day | INTRAMUSCULAR | Status: DC

## 2022-10-22 MED ORDER — DEXTROSE 10 % IV SOLN: INTRAVENOUS | Status: DC

## 2022-10-22 NOTE — Care Management Important Message (Signed)
Important Message  Patient Details  Name: [name] MRN: [id number] Date of Birth: 02-11-1951   Medicare Important Message Given:  Yes     Orbie Pyo 10/22/2022, 2:44 PM

## 2022-10-22 NOTE — TOC Initial Note (Signed)
Transition of Care [facility]) - Initial/Assessment Note    Patient Details  Name: [name] MRN: [id number] Date of Birth: 21-Jun-1951  Transition of [facility] CM/SW Contact:    Coralee Pesa, [place] Phone Number: 10/22/2022, 11:45 AM  Clinical Narrative:                 CSW confirmed with [name] that pt is a LTC resident with no barriers to returning when pt is medically stable. An updated FL2 was requested and provided. CSW spoke with pt's sister who confirms they are agreeable to pt returning to [place] [facility] [place] with no concerns. Pt's son is working with Charna Archer to ensure her bed is held. TOC to assist with discharge when medically ready.   Expected Discharge Plan: Skilled Nursing Facility Barriers to Discharge: Continued Medical Work up   Patient Goals and CMS Choice Patient states their goals for this hospitalization and ongoing recovery are:: Pt is disoriented and unable to participate in goal setting. CMS Medicare.gov Compare Post Acute Care list provided to:: Patient Represenative (must comment) Choice offered to / list presented to : Sibling  Expected Discharge Plan and Services Expected Discharge Plan: [place] Choice: [place] Living arrangements for the past 2 months: [place]                                      Prior Living Arrangements/Services Living arrangements for the past 2 months: [place] Lives with:: Facility Resident Patient language and need for interpreter reviewed:: Yes Do you feel safe going back to the place where you live?: Yes      Need for Family Participation in Patient Care: Yes (Comment) Care giver support system in place?: Yes (comment)   Criminal Activity/Legal Involvement Pertinent to Current Situation/Hospitalization: No - Comment as needed  Activities of Daily Living      Permission Sought/Granted Permission sought to share information  with : Family Supports Permission granted to share information with : Yes, Verbal Permission Granted  Share Information with NAME: [name] and Quillian Quince     Permission granted to share info w Relationship: Sister and son     Emotional Assessment Appearance:: Appears stated age Attitude/Demeanor/Rapport: Unable to Assess Affect (typically observed): Unable to Assess Orientation: : Oriented to Self Alcohol / Substance Use: Not Applicable Psych Involvement: No (comment)  Admission diagnosis:  Seizure-like activity ([place]) [R56.9] Altered mental status, unspecified altered mental status type [R41.82] AMS (altered mental status) [R41.82] Patient Active Problem List   Diagnosis Date Noted   Pressure injury of skin 10/21/2022   Acute encephalopathy 10/19/2022   Transaminitis 09/10/2022   Dermatitis 05/10/2022   Physical deconditioning 05/07/2022   Hypervolemia associated with renal insufficiency 05/06/2022   DNR (do not resuscitate) 05/06/2022   Septic shock vs. hypovolemic shock in setting of covid 19     MSSA bacteremia    Vascular graft infection  and MSSA bacteremia 04/26/2022   Arteriovenous fistula, acquired ([place]) 70/26/3785   Cyclical vomiting syndrome 02/19/2022   Dysphagia 02/19/2022   Anemia of chronic renal failure 02/19/2022   Hypertensive heart and chronic kidney disease with heart failure and with stage 5 chronic kidney disease, or end stage renal disease ([place]) 02/19/2022   Mild cognitive impairment 02/19/2022   Major depression, single episode 02/19/2022   Fall at home, initial encounter 01/23/2022  Acute on chronic diastolic CHF 26/37/8588   GERD (gastroesophageal reflux disease)    Other disorders of calcium metabolism 10/17/2021   Protein-calorie malnutrition, severe 08/12/2021   Acute respiratory failure with hypoxia ([place]) 08/06/2021   Major depressive disorder, recurrent episode, moderate ([place]) 02/23/2021   Prolonged QT interval 02/21/2021   Allergy, unspecified,  initial encounter 08/08/2020   NSTEMI 05/14/2020   Hypotension 03/16/2020   Ventricular tachycardia ([place]) 03/15/2020   Seizure ([place]) 50/27/7412   Complication of vascular access for dialysis 12/21/2019   Breakdown (mechanical) of surgically created arteriovenous fistula, initial encounter ([place]) 12/18/2019   Weakness 10/11/2019   Aneurysm artery, 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 difficile diarrhea 04/17/2019   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   Primary generalized (osteo)arthritis 04/17/2019   Pure hypercholesterolemia, unspecified 04/17/2019   Secondary hyperparathyroidism of renal origin ([place]) 04/17/2019   Gastro-esophageal reflux disease without esophagitis 04/17/2019   Essential (primary) hypertension 04/17/2019   Transient cerebral ischemic attack, unspecified 04/17/2019   Prolonged Q-T interval on ECG 02/24/2017   Malnutrition of moderate degree 12/24/2016   Problem with dialysis access ([place]) 12/21/2016   Neurologic abnormality 11/19/2015   Anxiety 11/19/2015   Insomnia 11/19/2015   Gait instability    Dizziness 05/09/2015   Ataxia 05/09/2015   H/O: CVA (cerebrovascular accident) 05/09/2015   Left facial numbness 05/09/2015   Left leg numbness 05/09/2015   Hyperlipidemia    SOB (shortness of breath) 04/01/2013   ESRD on dialysis ([place]) 11/07/2012   Dyspnea 12/31/2011   PCP:  [name], MD Pharmacy:   [facility] [address] [place] [address] Phone: [phone] Fax: [place], [place] [address] [place]  [address] Phone: [phone] Fax: [phone]     Social Determinants of Health (SDOH) Interventions    Readmission Risk Interventions    11/11/2021    3:49 PM 08/18/2021    3:01 PM  Readmission Risk Prevention Plan  Transportation Screening Complete Complete  Medication Review (RN Care Manager) Complete Complete  PCP or Specialist appointment within 3-5 days of discharge Complete Complete  HRI or Home Care Consult Complete Complete  SW Recovery Care/Counseling Consult Complete Complete  Palliative Care Screening Not Applicable Not Applicable  Skilled Nursing Facility Complete Complete

## 2022-10-22 NOTE — Progress Notes (Signed)
Speech Language Pathology Treatment: Dysphagia  Patient Details Name: [name] MRN: [id number] DOB: 03-04-1951 Today's Date: 10/22/2022 Time: 3154-0086 SLP Time Calculation (min) (ACUTE ONLY): 10 min  Assessment / Plan / Recommendation Clinical Impression  RN reports that pt has had a baseline wet cough today, not noted just during PO intake but with some concerns about correlation. Although pt is still with altered mentation, RN also says that she is alert enough to eat anything offered to her. She is verbalizing a little today, which is improved from initial evaluation with this SLP. I do not note a baseline cough or a wet vocal quality, but there is a delayed cough noted at times after trials of nectar thick liquids. Oral phase is prolonged with purees but there are no overt signs concerning for aspiration. Given concerns from nursing and coughing noted at times with thickened liquids with SLP, recommend pursuing MBS. This can be completed on next date at the earliest, so would focus on adherence to aspiration precautions in the interim.    HPI HPI: Pt is a 71 yo female admitted from SNF with AMS. Per neurology consult note, differential dx may include seizures, toxic metabolic encephalopathy, or dementia related cognitive fluctuation. Work up currently underway, with continuous EEG started and MRI pending. Previous swallow eval in August 2021 recommending regular diet and thin liquids but with concern for esophageal component (eructation, c/o pain in her stomach after eating, multiple swallows). 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  Recommendations may be updated based on patient  status, additional functional criteria and insurance authorization.    Recommendations  Diet recommendations: Dysphagia 1 (puree);Nectar-thick liquid Liquids provided via: Straw;Cup Medication Administration: Crushed with puree Supervision: Full supervision/cueing for compensatory strategies;Trained caregiver to feed patient Compensations: Minimize environmental distractions;Slow rate;Small sips/bites Postural Changes and/or Swallow Maneuvers: Seated upright 90 degrees;Upright 30-60 min after meal                Oral Care Recommendations: Oral care QID Follow Up Recommendations: Skilled nursing-short term rehab (<3 hours/day) Assistance recommended at discharge: Frequent or constant Supervision/Assistance SLP Visit Diagnosis: Dysphagia, unspecified (R13.10) Plan: MBS           Osie Bond., M.A. [place] Office [phone]  Secure chat preferred   10/22/2022, 3:39 PM

## 2022-10-22 NOTE — Progress Notes (Addendum)
[facility] PROGRESS NOTE   [name] TKZ:[id number] DOB: November 01, 1951 DOA: 10/19/2022  PCP: Renford Dills, MD  Brief History/Interval Summary: 71 y.o. 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 Patient resides at nursing home, and this morning staff noticed patient became confused unresponsive with slurred speech and right-sided gaze, and called EMS. In the ED, patient developed an episode of foaming mouth and drooling and unable to talk, lasted about 1 minute associated with hypoxia and tachycardia.  Also had some reported twitching like movement of bilateral arms, 1 dose of Ativan push given and patient was also loaded with Keppra.     Consultants: Neurology  Procedures: Continuous EEG    Subjective/Interval History: Patient with eyes open.  Occasionally responds to my questions but for the most part does not answer appropriately.  She does mention that she is hungry.   Assessment/Plan:  Acute metabolic encephalopathy/Concern for seizure activity Neurology is following.  CT head did not show any acute findings.  MRI brain did not show any acute findings.  CT angiogram head and neck showed moderate to severe stenosis in the mid to distal left V4 with diminished opacification of the left PICA.  Mild to moderate stenosis in the bilateral cavernous and supraclinoid ICA segments.  Management per neurology. Patient remains on continuous EEG.  Patient remains on Keppra.  Oropharyngeal dysphagia Cleared for pured diet yesterday.  Mentation appears to be improving.  Patient will need to be assisted with feedings.  She is noted to be hypoglycemic likely due to poor nutritional intake.  However prognosis is guarded at this time.  Patient with numerous  comorbidities.  If her dysphagia does not improve then discussions may need to be held with family.  We will go ahead and request palliative medicine to get involved in this patient's care at this time.  Concern for pneumonia on imaging studies CT angiogram head and neck incidentally showed concern for pneumonia.  She might have aspirated.  However she is afebrile.  WBC is normal.  Respiratory effort is normal.  Possibly chemical pneumonitis.  We will hold off on antibiotics for now.    End-stage renal disease on hemodialysis Dialyzed on Monday Wednesday Friday schedule. Nephrology is following.  Hypoglycemia Low glucose levels likely due to poor oral intake.  She was started on D5 infusion yesterday with not much improvement in her glucose levels.  Will be switched over to D10 infusion today.  See discussion under dysphagia.    Orthostatic hypotension Noted to be on midodrine.  Blood pressure is reasonably well controlled.  History of coronary artery disease/history of stroke Noted to be on Lipitor, Plavix. Patient does have left-sided deficits from previous stroke.  Abnormal thyroid function test TSH is elevated.  Free T4 was also noted to be elevated. No known history of thyroid disease.  Recommend that these levels be rechecked in 4 to 6 weeks.  Goals Of care Spoke to patient's son. He mentioned that he wants her to be DNR based on previous conversations. He was told about her low glucose levels and poor oral intake. Agreeable to palliative care. Will change to DNR.  DVT Prophylaxis: Subcutaneous heparin Code Status: DNR Family Communication: No family at bedside Disposition Plan: Here from skilled nursing facility.  Anticipate discharge to SNF when medically stable.  Status is: Inpatient Remains inpatient appropriate because: Altered mental status, concern for seizure activity      Medications: Scheduled:  atorvastatin  80 mg Oral QHS   Chlorhexidine Gluconate Cloth  6  each Topical Q0600   Chlorhexidine Gluconate Cloth  6 each Topical Q0600   cinacalcet  30 mg Oral Q M,W,F-HD   clopidogrel  75 mg Oral Daily   escitalopram  10 mg Oral q morning   heparin  5,000 Units Subcutaneous Q12H   midodrine  10 mg Oral TID WC   multivitamin  1 tablet Oral QHS   mouth rinse  15 mL Mouth Rinse Q2H   pantoprazole  40 mg Oral Daily   sevelamer carbonate  800 mg Oral TID WC   Continuous:  dextrose     ZOX:[id number], hydrOXYzine, LORazepam, LORazepam, mouth rinse  Antibiotics: Anti-infectives (From admission, onward)    None       Objective:  Vital Signs  Vitals:   10/21/22 1935 10/21/22 2318 10/22/22 0311 10/22/22 0743  BP: (!) 104/92 (!) 86/52 (!) 110/51 (!) 107/45  Pulse: 90 85 87 91  Resp: 13 15 19 18   Temp: (!) 97.4 F (36.3 C) 97.9 F (36.6 C) 98.7 F (37.1 C) 98.6 F (37 C)  TempSrc: Oral Oral Oral Oral  SpO2: 99% 100% 100% 100%  Weight:        Intake/Output Summary (Last 24 hours) at 10/22/2022 0947 Last data filed at 10/21/2022 1558 Gross per 24 hour  Intake --  Output 0 ml  Net 0 ml    Filed Weights   10/21/22 1115  Weight: 42.4 kg    General appearance: Noted to be more awake today compared to yesterday. Resp: Clear to auscultation bilaterally.  Normal effort Cardio: S1-S2 is normal regular.  No S3-S4.  No rubs murmurs or bruit GI: Abdomen is soft.  Nontender nondistended.  Bowel sounds are present normal.  No masses organomegaly Extremities: No edema.  Noted to be moving all of her extremities    Lab Results:  Data Reviewed: I have personally reviewed following labs and reports of the imaging studies  CBC: Recent Labs  Lab 10/19/22 1005 10/19/22 1355 10/21/22 0501  WBC 10.0  --  7.5  NEUTROABS 7.0  --   --   HGB 11.9* 11.6* 9.9*  HCT 38.9 34.0* 30.0*  MCV 106.3*  --  99.7  PLT 317  --  182     Basic Metabolic Panel: Recent Labs  Lab 10/19/22 1005 10/19/22 1355 10/21/22 0501  NA 142 135 139  K 5.4*  4.9 3.6  CL 94*  --  98  CO2 20*  --  28  GLUCOSE 191*  --  79  BUN 58*  --  30*  CREATININE 5.97*  --  4.00*  CALCIUM 11.2*  --  10.3  MG 2.7*  --   --      GFR: Estimated Creatinine Clearance: 8.5 mL/min (A) (by C-G formula based on SCr of 4 mg/dL (H)).  Liver Function Tests: Recent Labs  Lab 10/19/22 1005  AST 31  ALT 12  ALKPHOS 104  BILITOT 1.4*  PROT 7.0  ALBUMIN 3.2*     Recent Labs  Lab 10/19/22 1005  LIPASE 44    Recent Labs  Lab 10/19/22 1345  AMMONIA 32     CBG: Recent Labs  Lab 10/21/22 0755 10/21/22 1929 10/21/22 2312 10/22/22 0314 10/22/22 0812  GLUCAP 70 67* 64* 66* 68*     Thyroid  Function Tests: Recent Labs    10/19/22 1005 10/20/22 0553  TSH 5.044*  --   FREET4  --  1.88*     Anemia Panel: Recent Labs    10/20/22 0553  VITAMINB12 1,016*     Radiology Studies: CT ANGIO HEAD NECK W WO CM  Result Date: 10/21/2022 CLINICAL DATA:  Stroke suspected EXAM: CT ANGIOGRAPHY HEAD AND NECK TECHNIQUE: Multidetector CT imaging of the head and neck was performed using the standard protocol during bolus administration of intravenous contrast. Multiplanar CT image reconstructions and MIPs were obtained to evaluate the vascular anatomy. Carotid stenosis measurements (when applicable) are obtained utilizing NASCET criteria, using the distal internal carotid diameter as the denominator. RADIATION DOSE REDUCTION: This exam was performed according to the departmental dose-optimization program which includes automated exposure control, adjustment of the mA and/or kV according to patient size and/or use of iterative reconstruction technique. CONTRAST:  75mL OMNIPAQUE IOHEXOL 350 MG/ML SOLN COMPARISON:  Prior CTA of the head and neck, correlation is made with CT head 10/19/2022 and MRA head 03/02/2021 FINDINGS: CT HEAD FINDINGS For noncontrast findings, please see 10/19/2022 CT head. CTA NECK FINDINGS Aortic arch: Two-vessel arch with a common origin  of the brachiocephalic and left common carotid arteries. Imaged portion shows no evidence of aneurysm or dissection. No significant stenosis of the major arch vessel origins. Aortic atherosclerosis. The proximal left subclavian artery is quite tortuous and has a retrotracheal course (series 7, image 275). Right carotid system: No evidence of dissection, occlusion, or hemodynamically significant stenosis (greater than 50%). Atherosclerotic disease in the distal common carotid, at the bifurcation, and in the proximal ICA is not hemodynamically significant. Left carotid system: No evidence of dissection, occlusion, or hemodynamically significant stenosis (greater than 50%). Atherosclerotic disease in the distal common carotid, at the bifurcation, and in the proximal ICA is not hemodynamically significant. Vertebral arteries: No evidence of dissection, occlusion, or hemodynamically significant stenosis (greater than 50%). Right dominant system. Mild irregularity of the size of the left vertebral artery, without significant stenosis. Skeleton: Widening of the atlantodental interval, which appears chronic. Status post ACDF C3-C4 with evidence of prior fusion C5-C7. 5 mm anterolisthesis of C7 on T1 with severe endplate degenerative changes, unchanged which appears compared to 09/02/2022 chest CT. No acute osseous abnormality. Poor dentition with periapical lucencies about several maxillary teeth. Other neck: No acute finding. Upper chest: Increased tree-in-bud opacities in the right-greater-than-left upper lobe compared to 09/02/2022. Possible fluid in the left fissure and interlobular septal thickening. Review of the MIP images confirms the above findings CTA HEAD FINDINGS Anterior circulation: Both internal carotid arteries are patent to the termini, with mild-to-moderate stenosis in the bilateral cavernous and supraclinoid ICA segments. A1 segments patent. Normal anterior communicating artery. Anterior cerebral arteries  are patent to their distal aspects. No M1 stenosis or occlusion. MCA branches perfused and symmetric. Posterior circulation: Poor opacification of the left V4, likely reflecting moderate to severe stenosis in the mid to distal V4. No hemodynamically significant stenosis in the right vertebral artery. The right vertebral artery is dominant and patent to the vertebrobasilar junction. Posterior inferior cerebellar arteries patent proximally, although there is diminished opacification left PICA. Basilar patent to its distal aspect. Superior cerebellar arteries patent proximally. Patent P1 segments. PCAs perfused to their distal aspects without stenosis. The right posterior communicating artery is diminutive but patent. Venous sinuses: Not well opacified. Anatomic variants: None significant. Review of the MIP images confirms the above findings IMPRESSION: 1. Moderate to severe stenosis in  the mid to distal left V4, with diminished opacification of the left PICA. 2. Mild-to-moderate stenosis in the bilateral cavernous and supraclinoid ICA segments. 3. No hemodynamically significant stenosis in the neck. 4. Increased tree-in-bud opacities in the right-greater-than-left upper lobes, concerning for infection or aspiration. 5. Aortic atherosclerosis. Aortic Atherosclerosis (ICD10-I70.0). Electronically Signed   By: Wiliam Ke M.D.   On: 10/21/2022 23:10   MR BRAIN WO CONTRAST  Result Date: 10/21/2022 CLINICAL DATA:  Altered mental status EXAM: MRI HEAD WITHOUT CONTRAST TECHNIQUE: Multiplanar, multiecho pulse sequences of the brain and surrounding structures were obtained without intravenous contrast. COMPARISON:  02/22/2021 FINDINGS: Evaluation is somewhat limited by motion artifact. Brain: No restricted diffusion with ADC correlate to suggest acute or subacute infarct. There is some T2 shine through in the right parietal and occipital periventricular white matter (series 5, images 76-80 and series 11, images 15-17),  which is associated with focal encephalomalacia (series 16, image 37), new from 03/02/2021 and favored to represent now remote interval infarct. No acute hemorrhage, mass, mass effect, or midline shift. Confluence T2 hyperintense signal in the periventricular white matter and pons, likely the sequela of severe chronic small vessel ischemic disease. Advanced cerebral volume loss for age. Remote lacunar infarcts in the pons, bilateral basal ganglia, and thalami. Vascular: Patent arterial flow voids. Skull and upper cervical spine: Normal marrow signal. Status post ACDF C3-C4. Sinuses/Orbits: No acute finding. Other: Fluid in the right-greater-than-left mastoid air cells. IMPRESSION: No acute intracranial process. No evidence of acute or subacute infarct. Electronically Signed   By: Wiliam Ke M.D.   On: 10/21/2022 22:09   Overnight EEG with video  Result Date: 10/21/2022 Charlsie Quest, MD     10/21/2022 11:43 AM Patient Name: [name] MRN: [id number] Epilepsy Attending: Charlsie Quest Referring Physician/Provider: Erick Blinks, MD Duration: 10/20/2022 1000 to 10/21/2022 6295  Patient history: 71 year old patient with end-stage renal disease hypertension hyperlipidemia CAD and previous stroke presents with acute onset confusion and hallucinations. EEG to evaluate for seizure  Level of alertness: lethargic, asleep  AEDs during EEG study: LEV  Technical aspects: This EEG study was done with scalp electrodes positioned according to the 10-20 International system of electrode placement. Electrical activity was reviewed with band pass filter of 1-70Hz , sensitivity of 7 uV/mm, display speed of 39mm/sec with a 60Hz  notched filter applied as appropriate. EEG data were recorded continuously and digitally stored.  Video monitoring was available and reviewed as appropriate.  Description: During awake state, no clear posterior dominant rhythm was seen. Sleep was characterized by sleep spindles (12-14),  maximal frontocentral region. EEG showed continuous generalized 3 to 6 Hz theta-delta slowing.  Generalized periodic discharges with triphasic morphology at 1hz  were also noted, more frequent when awake/stimulated.  Hyperventilation and photic stimulation were not performed.    ABNORMALITY - Periodic discharges with triphasic morphology, generalized ( GPDs) - Continuous slow, generalized  IMPRESSION: This study showed generalized periodic discharges with triphasic morphology which are most likely due to toxic-metabolic causes due to the morphology, frequency and reactivity to stimulation. Additionally there was moderate to severe diffuse encephalopathy, nonspecific etiology.  No seizures were seen during the study.  Charlsie Quest   EEG adult  Result Date: 10/20/2022 Charlsie Quest, MD     10/21/2022 11:33 AM Patient Name: [name] MRN: [id number] Epilepsy Attending: Charlsie Quest Referring Physician/Provider: Erick Blinks, MD Date: 10/20/2022 Duration: 33.05 mins Patient history: 71 year old patient with end-stage renal disease hypertension hyperlipidemia CAD and previous  stroke presents with acute onset confusion and hallucinations. EEG to evaluate for seizure Level of alertness: lethargic AEDs during EEG study: None Technical aspects: This EEG study was done with scalp electrodes positioned according to the 10-20 International system of electrode placement. Electrical activity was reviewed with band pass filter of 1-70Hz , sensitivity of 7 uV/mm, display speed of 63mm/sec with a 60Hz  notched filter applied as appropriate. EEG data were recorded continuously and digitally stored.  Video monitoring was available and reviewed as appropriate. Description: EEG showed continuous generalized 3 to 6 Hz theta-delta slowing.  Generalized periodic discharges with triphasic morphology at 1hz  were also noted.  Hyperventilation and photic stimulation were not performed.   Patient was noted to have left  shoulder twitching in the area over the dialysis fistula.  Concomitant EEG before, during and after the event did not show any EEG changes suggest seizure. ABNORMALITY - Periodic discharges with triphasic morphology, generalized ( GPDs) - Continuous slow, generalized IMPRESSION: This study showed generalized periodic discharges with triphasic morphology which can get the ictal-interictal continuum.  However the frequency and morphology is more commonly indicative of toxic-metabolic causes.  Additionally there was moderate to severe diffuse encephalopathy, nonspecific etiology. Patient was noted to have left shoulder twitching in the area over the dialysis fistula without concomitant EEG change.  This is most likely not an epileptic event.  Priyanka Annabelle Harman       LOS: 3 days   Naydeline Morace [employer] on [email or web address]  10/22/2022, 9:47 AM

## 2022-10-22 NOTE — NC FL2 (Signed)
[place] LEVEL OF CARE SCREENING TOOL     IDENTIFICATION  Patient Name: NYKERRIA MACCONNELL Birthdate: 11-Jan-1951 Sex: female Admission Date (Current Location): 10/19/2022  [facility] and [place] Number:  Herbalist and Address:  [facility] [place], [place] [address] [place], [place] [address]      Provider Number: [id number]  Attending Physician Name and Address:  Bonnielee Haff, MD  Relative Name and Phone Number:  [name] [phone]    Current Level of Care: Hospital Recommended Level of Care: [place] Prior Approval Number:    Date Approved/Denied:   PASRR Number: [id number]  Discharge Plan: SNF    Current Diagnoses: Patient Active Problem List   Diagnosis Date Noted   Pressure injury of skin 10/21/2022   Acute encephalopathy 10/19/2022   Transaminitis 09/10/2022   Dermatitis 05/10/2022   Physical deconditioning 05/07/2022   Hypervolemia associated with renal insufficiency 05/06/2022   DNR (do not resuscitate) 05/06/2022   Septic shock vs. hypovolemic shock in setting of covid 19     MSSA bacteremia    Vascular graft infection  and MSSA bacteremia 04/26/2022   Arteriovenous fistula, acquired ([place]) 32/99/2426   Cyclical vomiting syndrome 02/19/2022   Dysphagia 02/19/2022   Anemia of chronic renal failure 02/19/2022   Hypertensive heart and chronic kidney disease with heart failure and with stage 5 chronic kidney disease, or end stage renal disease ([place]) 02/19/2022   Mild cognitive impairment 02/19/2022   Major depression, single episode 02/19/2022   Fall at home, initial encounter 01/23/2022   Acute on chronic diastolic CHF 83/41/9622   GERD (gastroesophageal reflux disease)    Other disorders of calcium metabolism 10/17/2021   Protein-calorie malnutrition, severe 08/12/2021   Acute respiratory failure with hypoxia ([place]) 08/06/2021   Major depressive disorder, recurrent episode, moderate ([facility]) 02/23/2021    Prolonged QT interval 02/21/2021   Allergy, unspecified, initial encounter 08/08/2020   NSTEMI 05/14/2020   Hypotension 03/16/2020   Ventricular tachycardia ([place]) 03/15/2020   Seizure ([place]) 29/79/8921   Complication of vascular access for dialysis 12/21/2019   Breakdown (mechanical) of surgically created arteriovenous fistula, initial encounter ([place]) 12/18/2019   Weakness 10/11/2019   Aneurysm artery, 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 difficile diarrhea 04/17/2019   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   Primary generalized (osteo)arthritis 04/17/2019   Pure hypercholesterolemia, unspecified 04/17/2019   Secondary hyperparathyroidism of renal origin ([place]) 04/17/2019   Gastro-esophageal reflux disease without esophagitis 04/17/2019   Essential (primary) hypertension 04/17/2019   Transient cerebral ischemic attack, unspecified 04/17/2019   Prolonged Q-T interval on ECG 02/24/2017   Malnutrition of moderate degree 12/24/2016   Problem with dialysis access ([place]) 12/21/2016   Neurologic abnormality 11/19/2015   Anxiety 11/19/2015   Insomnia 11/19/2015   Gait instability    Dizziness 05/09/2015   Ataxia 05/09/2015   H/O: CVA (cerebrovascular accident) 05/09/2015   Left facial numbness 05/09/2015   Left leg numbness 05/09/2015   Hyperlipidemia    SOB (shortness of breath) 04/01/2013   ESRD on dialysis ([place]) 11/07/2012   Dyspnea 12/31/2011    Orientation RESPIRATION BLADDER Height & Weight     Self  O2 ([place] 2) Incontinent Weight: 93 lb 7.6 oz (42.4 kg) Height:     BEHAVIORAL  SYMPTOMS/MOOD NEUROLOGICAL BOWEL NUTRITION STATUS      Incontinent Diet (See DC summary)  AMBULATORY STATUS  COMMUNICATION OF NEEDS Skin   Extensive Assist Verbally PU Stage and Appropriate Care (Pressure Injury, St2, L Anterior Ankle)                       Personal Care Assistance Level of Assistance  Bathing, Feeding, Dressing Bathing Assistance: Maximum assistance Feeding assistance: Limited assistance Dressing Assistance: Maximum assistance     Functional Limitations Info  Sight, Hearing, Speech Sight Info: Adequate Hearing Info: Adequate Speech Info: Impaired    SPECIAL CARE FACTORS FREQUENCY  PT (By licensed PT), OT (By licensed OT)     PT Frequency: 5x week OT Frequency: 5x week            Contractures      Additional Factors Info  Code Status, Allergies, Psychotropic Code Status Info: Full Allergies Info: Sulfa Antibiotics, Adhesive tape Psychotropic Info: Escitalopram         Current Medications (10/22/2022):  This is the current hospital active medication list Current Facility-Administered Medications  Medication Dose Route Frequency Provider Last Rate Last Admin   albuterol (PROVENTIL) (2.5 MG/3ML) 0.083% nebulizer solution 2.5 mg  2.5 mg Nebulization Q4H PRN Wynetta Fines T, MD       atorvastatin (LIPITOR) tablet 80 mg  80 mg Oral QHS Wynetta Fines T, MD       Chlorhexidine Gluconate Cloth 2 % PADS 6 each  6 each Topical Q0600 Collins, Hervey Ard, PA-C       Chlorhexidine Gluconate Cloth 2 % PADS 6 each  6 each Topical Q0600 Janalee Dane, PA-C   6 each at 10/22/22 0944   cinacalcet (SENSIPAR) tablet 30 mg  30 mg Oral Q M,W,F-HD Collins, Samantha G, PA-C       clopidogrel (PLAVIX) tablet 75 mg  75 mg Oral Daily Wynetta Fines T, MD   75 mg at 10/22/22 0946   dextrose 10 % infusion   Intravenous Continuous Bonnielee Haff, MD 30 mL/hr at 10/22/22 1019 New Bag at 10/22/22 1019   escitalopram (LEXAPRO) tablet 10 mg  10 mg Oral q morning Wynetta Fines T, MD   10 mg at 10/22/22 0946   heparin injection 5,000 Units  5,000 Units Subcutaneous Q12H Wynetta Fines T, MD    5,000 Units at 10/22/22 0947   hydrOXYzine (ATARAX) tablet 10 mg  10 mg Oral TID PRN Lequita Halt, MD       LORazepam (ATIVAN) injection 2 mg  2 mg Intravenous Q5 min PRN Bhagat, Srishti L, MD       LORazepam (ATIVAN) injection 4 mg  4 mg Intravenous Q5 Min x 2 PRN Wynetta Fines T, MD       midodrine (PROAMATINE) tablet 10 mg  10 mg Oral TID WC Wynetta Fines T, MD   10 mg at 10/22/22 0946   multivitamin (RENA-VIT) tablet 1 tablet  1 tablet Oral QHS Bonnielee Haff, MD       Oral care mouth rinse  15 mL Mouth Rinse Q2H Wynetta Fines T, MD   15 mL at 10/22/22 0800   Oral care mouth rinse  15 mL Mouth Rinse PRN Wynetta Fines T, MD       pantoprazole (PROTONIX) injection 40 mg  40 mg Intravenous Q24H Bonnielee Haff, MD       sevelamer carbonate (RENVELA) tablet 800 mg  800 mg Oral TID WC Lequita Halt, MD  800 mg at 10/22/22 0946   thiamine (VITAMIN B1) injection 100 mg  100 mg Intravenous Daily Donnetta Simpers, MD       Or   thiamine (VITAMIN B1) tablet 100 mg  100 mg Oral Daily Donnetta Simpers, MD         Discharge Medications: Please see discharge summary for a list of discharge medications.  Relevant Imaging Results:  Relevant Lab Results:   Additional Information [phone] at [facility] on MWF.  Coralee Pesa, LCSWA

## 2022-10-22 NOTE — Progress Notes (Signed)
Palliative:  Chart reviewed extensively.  Visited patient at bedside, no family present. Patient is communicative but certainly not able to discuss goals of care. When I ask her name she tells me "I do not have one". She is unaware she is in the hospital. Visited patient's bedside x2 throughout day but no family was present. Call to patient's son Quillian Quince who appears to be next of kin based on chart review - no answer, voicemail left with call back number.   Will continue attempts to discuss goals of care with patient's son.   Juel Burrow, DNP, AGNP-C Palliative Medicine Team Team Phone # [phone]  Pager # [phone]  NO CHARGE

## 2022-10-22 NOTE — Progress Notes (Signed)
Patient ID: [name], female   DOB: October 18, 1951, 71 y.o.   MRN: [id number] [facility] Progress Note   Assessment/ Plan:   1.  Altered mental status: With earlier evidence of seizure like activity for which she underwent neurology evaluation and EEG that showed generalized triphasic morphology discharges consistent with nonspecific moderate to severe diffuse metabolic encephalopathy. 2. ESRD: She is usually on a Monday/Wednesday/Friday dialysis schedule.  Plan to maintain schedule 3. Anemia: Downtrending hemoglobin and hematocrit noted without any overt losses. CTM closely 4. CKD-MBD: Earlier hypercalcemia noted for which Hectorol was appropriately discontinued.  Continue Cinacalcet for PTH control and potentially mitigation of hypercalcemia. 5. Nutrition: advancing diet as able 6. Hypotension: Blood pressure under decent control with ongoing midodrine.  Ultrafiltration somewhat limited with marginal hypotension.  Subjective:   Patient stable today with no complaints.  Tolerated dialysis yesterday   Objective:   BP (!) 107/45 (BP Location: Left Leg)   Pulse 91   Temp 98.6 F (37 C) (Oral)   Resp 18   Wt 42.4 kg   SpO2 100%   BMI 17.10 kg/m   Physical Exam: Gen: Able to answer some questions.  Not oriented CVS: Normal rate, no edema Resp: Bilateral chest rise with no creased work of breathing Abd: Soft, scaphoid, bowel sounds normal Ext: Warm and well perfused.  Hyper pulsatile left upper arm AV fistula with distended cephalic vein to her chest.  Labs: BMET Recent Labs  Lab 10/19/22 1005 10/19/22 1355 10/21/22 0501  NA 142 135 139  K 5.4* 4.9 3.6  CL 94*  --  98  CO2 20*  --  28  GLUCOSE 191*  --  79  BUN 58*  --  30*  CREATININE 5.97*  --  4.00*  CALCIUM 11.2*  --  10.3   CBC Recent Labs  Lab 10/19/22 1005 10/19/22 1355 10/21/22 0501  WBC 10.0  --  7.5  NEUTROABS 7.0  --   --   HGB 11.9* 11.6* 9.9*  HCT 38.9 34.0* 30.0*  MCV 106.3*  --   99.7  PLT 317  --  182      Medications:     atorvastatin  80 mg Oral QHS   Chlorhexidine Gluconate Cloth  6 each Topical Q0600   Chlorhexidine Gluconate Cloth  6 each Topical Q0600   cinacalcet  30 mg Oral Q M,W,F-HD   clopidogrel  75 mg Oral Daily   escitalopram  10 mg Oral q morning   heparin  5,000 Units Subcutaneous Q12H   midodrine  10 mg Oral TID WC   multivitamin  1 tablet Oral QHS   mouth rinse  15 mL Mouth Rinse Q2H   pantoprazole (PROTONIX) IV  40 mg Intravenous Q24H   sevelamer carbonate  800 mg Oral TID WC   Shaune Pollack Jamille Fisher  10/22/2022, 10:45 AM

## 2022-10-23 ENCOUNTER — Inpatient Hospital Stay (HOSPITAL_COMMUNITY): Payer: Medicare Other

## 2022-10-23 DIAGNOSIS — Z7189 Other specified counseling: Secondary | ICD-10-CM | POA: Diagnosis not present

## 2022-10-23 DIAGNOSIS — Z515 Encounter for palliative care: Secondary | ICD-10-CM

## 2022-10-23 DIAGNOSIS — G9340 Encephalopathy, unspecified: Secondary | ICD-10-CM | POA: Diagnosis not present

## 2022-10-23 DIAGNOSIS — R946 Abnormal results of thyroid function studies: Secondary | ICD-10-CM

## 2022-10-23 DIAGNOSIS — R627 Adult failure to thrive: Secondary | ICD-10-CM

## 2022-10-23 DIAGNOSIS — R1312 Dysphagia, oropharyngeal phase: Secondary | ICD-10-CM

## 2022-10-23 DIAGNOSIS — Z66 Do not resuscitate: Secondary | ICD-10-CM

## 2022-10-23 DIAGNOSIS — R4182 Altered mental status, unspecified: Secondary | ICD-10-CM | POA: Diagnosis not present

## 2022-10-23 LAB — COMPREHENSIVE METABOLIC PANEL
ALT: 12 U/L (ref 0–44)
AST: 20 U/L (ref 15–41)
Albumin: 2.4 g/dL — ABNORMAL LOW (ref 3.5–5.0)
Alkaline Phosphatase: 86 U/L (ref 38–126)
Anion gap: 13 (ref 5–15)
BUN: 22 mg/dL (ref 8–23)
CO2: 25 mmol/L (ref 22–32)
Calcium: 9.7 mg/dL (ref 8.9–10.3)
Chloride: 96 mmol/L — ABNORMAL LOW (ref 98–111)
Creatinine, Ser: 3.68 mg/dL — ABNORMAL HIGH (ref 0.44–1.00)
GFR, Estimated: 13 mL/min — ABNORMAL LOW (ref >60)
Glucose, Bld: 125 mg/dL — ABNORMAL HIGH (ref 70–99)
Potassium: 3.8 mmol/L (ref 3.5–5.1)
Sodium: 134 mmol/L — ABNORMAL LOW (ref 135–145)
Total Bilirubin: 1.5 mg/dL — ABNORMAL HIGH (ref 0.3–1.2)
Total Protein: 5.5 g/dL — ABNORMAL LOW (ref 6.5–8.1)

## 2022-10-23 LAB — GLUCOSE, CAPILLARY
Glucose-Capillary: 131 mg/dL — ABNORMAL HIGH (ref 70–99)
Glucose-Capillary: 140 mg/dL — ABNORMAL HIGH (ref 70–99)
Glucose-Capillary: 69 mg/dL — ABNORMAL LOW (ref 70–99)

## 2022-10-23 LAB — CBC
HCT: 29.1 % — ABNORMAL LOW (ref 36.0–46.0)
Hemoglobin: 9.3 g/dL — ABNORMAL LOW (ref 12.0–15.0)
MCH: 32.7 pg (ref 26.0–34.0)
MCHC: 32 g/dL (ref 30.0–36.0)
MCV: 102.5 fL — ABNORMAL HIGH (ref 80.0–100.0)
Platelets: 174 10*3/uL (ref 150–400)
RBC: 2.84 MIL/uL — ABNORMAL LOW (ref 3.87–5.11)
RDW: 18.1 % — ABNORMAL HIGH (ref 11.5–15.5)
WBC: 11 10*3/uL — ABNORMAL HIGH (ref 4.0–10.5)
nRBC: 0.2 % (ref 0.0–0.2)

## 2022-10-23 LAB — METHYLMALONIC ACID, SERUM: Methylmalonic Acid, Quantitative: 1504 nmol/L — ABNORMAL HIGH (ref 0–378)

## 2022-10-23 LAB — VITAMIN B1: Vitamin B1 (Thiamine): 204.8 nmol/L — ABNORMAL HIGH (ref 66.5–200.0)

## 2022-10-23 MED ORDER — DEXTROSE 10 % IV SOLN
INTRAVENOUS | Status: DC
  Administered 2022-10-23: 280 mL via INTRAVENOUS

## 2022-10-23 NOTE — Progress Notes (Signed)
NEUROLOGY CONSULTATION PROGRESS NOTE   Date of service: October 23, 2022 Patient Name: [name] MRN:  [id number] DOB:  12-20-1950  Brief HPI  [name] is a 71 year old patient with history of end-stage renal disease on dialysis, CHF, anemia, CAD, non-STEMI, GERD, stroke, hypertension and hyperlipidemia presented with acute onset altered mental status.  Patient was in a nursing facility and suddenly became anxious agitated and had hallucinations.  Per family and chart, patient is alert and oriented at baseline, however on presentation she was unable to follow commands or answer questions.  While in the ED yesterday, patient had an episode of foaming at the mouth being unable to speak and drooling which lasted about a minute and included hypoxia and tachycardia.  Per patient's daughter, she has had right gaze deviation for about 3 weeks, but her son states he has not noted this.  She did have right gaze deviation upon presentation to the ED.  Interval Hx   Patient remains in bed with eyes closed, arouses only to repeated tactile stimulation.  Vital signs have been stable, and MRI reveals no acute abnormalities.  Vitals   Vitals:   10/22/22 2332 10/23/22 0100 10/23/22 0330 10/23/22 0814  BP:  120/66  (!) 105/36  Pulse: (!) 102 96 93 89  Resp: '12  15 17  '$ Temp: 97.6 F (36.4 C) 97.9 F (36.6 C) 97.9 F (36.6 C) (!) 97.3 F (36.3 C)  TempSrc: Oral Oral  Axillary  SpO2: 100% 100% 100% 100%  Weight:         Body mass index is 17.1 kg/m.  Physical Exam   General: Laying comfortably in bed; in no acute distress.  HENT: Normal oropharynx with dry mucosa. Normal external appearance of ears and nose.  Neck: Supple, no pain or tenderness  CV: No JVD. No peripheral edema.  Pulmonary: Symmetric Chest rise. Normal respiratory effort.  Ext: No cyanosis, edema, or deformity  Skin: No rash. Normal palpation of skin.   Musculoskeletal: Normal digits and nails by inspection. No  clubbing.   Neurologic Examination   NEURO:  Mental Status: AA&Ox1 only Speech/Language: speech is sparse with patient communicating in single words and short phrases only.  However speech is clear without aphasia or dysarthria  Cranial Nerves:  II: PERRL.  III, IV, VI: EOMI. Eyelids elevate symmetrically.  VII: Slight left facial droop VIII: hearing intact to voice. IX, X: Phonation is normal.  XII: tongue is midline without fasciculations. Motor: Moves all extremities not purposefully with antigravity strength in bilateral upper extremities Tone: is normal and bulk is normal Sensation-appears intact to light touch Coordination: Unable to test DTRs: 2+ in bicep, unable to elicit patellar Gait- deferred   Labs   Basic Metabolic Panel:  Lab Results  Component Value Date   NA 134 (L) 10/23/2022   K 3.8 10/23/2022   CO2 25 10/23/2022   GLUCOSE 125 (H) 10/23/2022   BUN 22 10/23/2022   CREATININE 3.68 (H) 10/23/2022   CALCIUM 9.7 10/23/2022   GFRNONAA 13 (L) 10/23/2022   GFRAA 7 (L) 07/26/2020   HbA1c:  Lab Results  Component Value Date   HGBA1C 5.5 09/10/2022   LDL:  Lab Results  Component Value Date   LDLCALC 117 (H) 02/22/2021   Urine Drug Screen: No results found for: "LABOPIA", "COCAINSCRNUR", "LABBENZ", "AMPHETMU", "THCU", "LABBARB"  Alcohol Level     Component Value Date/Time   ETH <10 02/21/2021 2154   No results found for: "PHENYTOIN", "ZONISAMIDE", "LAMOTRIGINE", "LEVETIRACETA"  No results found for: "PHENYTOIN", "PHENOBARB", "VALPROATE", "CBMZ"  Imaging and Diagnostic studies  CT Head w/o contrast: No acute CT finding. Advanced generalized atrophy. Extensive chronic small-vessel ischemic changes throughout the white matter.  LTM EEG: GPD with triphasic, likely toxic metabolic.  MRI brain: No acute abnormality, atrophy and chronic small vessel disease.  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 Also reported hx of R gaze deviation for 3 weeks per family. In the ED, had episode of foaming mouth + drooling and inability to speak for about 1 minute. She was noted to have R gaze deviation on initial neuro eval along with no speech.   She was outside tnkase window, CT Angio non diagnostic. She was outside window for thrombectomy.   LTM EEG with no seizures, no epileptiform discharges.   MRI brain reveals no acute abnormality, however advanced atrophy small vessel disease and chronic microhemorrhages were noted.  Given head tilting onto the left shoulder, her R gaze may just be more of a attempt to compensate for the tilt to keep looking straight.  High suspicion that current presentation is likely toxic metabolic encephalopathy. She is improved but frail and plateaud.  Recommendations  - encephalopathy labs are non revealing. - Delirium precautions  - Initially started on Keppra but discontinued it given negative EEG and low overall suspicion for seizure. - improved but plateaued, suspect failure to thrive. - no further inpatient recs at this time. We will signoff. Please feel free to contact us with any questions or concerns. ______________________________________________________________________   Thank you for the opportunity to take part in the care of this patient. If you have any further questions, please contact the neurology consultation attending.  Signed,  [place] Pager Number [phone]

## 2022-10-23 NOTE — Progress Notes (Signed)
Patient ID: [name], female   DOB: 1951/02/10, 71 y.o.   MRN: [id number] [facility] Progress Note   Assessment/ Plan:   1.  Altered mental status: With earlier evidence of seizure like activity for which she underwent neurology evaluation and EEG that showed generalized triphasic morphology discharges consistent with nonspecific moderate to severe diffuse metabolic encephalopathy. 2. ESRD: She is usually on a Monday/Wednesday/Friday dialysis schedule.  Plan to maintain schedule 3. Anemia: Downtrending hemoglobin and hematocrit noted without any overt losses.  Hemoglobin 9.3 today.  CTM closely 4. CKD-MBD: Earlier hypercalcemia noted for which Hectorol was appropriately discontinued.  Continue Cinacalcet for PTH control and potentially mitigation of hypercalcemia. 5. Nutrition: advancing diet as able 6. Hypotension: Blood pressure under decent control with ongoing midodrine.  Ultrafiltration somewhat limited with marginal hypotension.  Subjective:   Patient is stable.  Denies any complaints.   Objective:   BP (!) 105/36   Pulse 89   Temp (!) 97.3 F (36.3 C) (Axillary)   Resp 17   Wt 42.4 kg   SpO2 100%   BMI 17.10 kg/m   Physical Exam: Gen: Resting today, no distress CVS: Normal rate, no edema Resp: Bilateral chest rise with no creased work of breathing Abd: Soft, scaphoid, bowel sounds normal Ext: Warm and well perfused.  Hyper pulsatile left upper arm AV fistula with distended cephalic vein to her chest.  Labs: BMET Recent Labs  Lab 10/19/22 1005 10/19/22 1355 10/21/22 0501 10/23/22 0407  NA 142 135 139 134*  K 5.4* 4.9 3.6 3.8  CL 94*  --  98 96*  CO2 20*  --  28 25  GLUCOSE 191*  --  79 125*  BUN 58*  --  30* 22  CREATININE 5.97*  --  4.00* 3.68*  CALCIUM 11.2*  --  10.3 9.7   CBC Recent Labs  Lab 10/19/22 1005 10/19/22 1355 10/21/22 0501 10/23/22 0407  WBC 10.0  --  7.5 11.0*  NEUTROABS 7.0  --   --   --   HGB 11.9* 11.6* 9.9*  9.3*  HCT 38.9 34.0* 30.0* 29.1*  MCV 106.3*  --  99.7 102.5*  PLT 317  --  182 174      Medications:     atorvastatin  80 mg Oral QHS   Chlorhexidine Gluconate Cloth  6 each Topical Q0600   Chlorhexidine Gluconate Cloth  6 each Topical Q0600   cinacalcet  30 mg Oral Q M,W,F-HD   clopidogrel  75 mg Oral Daily   escitalopram  10 mg Oral q morning   heparin  5,000 Units Subcutaneous Q12H   midodrine  10 mg Oral TID WC   multivitamin  1 tablet Oral QHS   mouth rinse  15 mL Mouth Rinse Q2H   pantoprazole (PROTONIX) IV  40 mg Intravenous Q24H   sevelamer carbonate  800 mg Oral TID WC   thiamine (VITAMIN B1) injection  100 mg Intravenous Daily   Or   thiamine  100 mg Oral Daily   Reesa Chew  10/23/2022, 9:26 AM

## 2022-10-23 NOTE — Progress Notes (Signed)
[facility] PROGRESS NOTE   [name] WUJ:[id number] DOB: 26-Jul-1951 DOA: 10/19/2022  PCP: Renford Dills, MD  Brief History/Interval Summary: 71 y.o. 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 Patient resides at nursing home, and this morning staff noticed patient became confused unresponsive with slurred speech and right-sided gaze, and called EMS. In the ED, patient developed an episode of foaming mouth and drooling and unable to talk, lasted about 1 minute associated with hypoxia and tachycardia.  Also had some reported twitching like movement of bilateral arms, 1 dose of Ativan push given and patient was also loaded with Keppra.     Consultants: Neurology  Procedures: Continuous EEG    Subjective/Interval History: Patient opens her eyes when her name is called.  Does not answer all questions appropriately.  Does not appear to be in any discomfort or distress.     Assessment/Plan:  Acute metabolic encephalopathy/Concern for seizure activity Neurology is following.  CT head did not show any acute findings.  MRI brain did not show any acute findings.  CT angiogram head and neck showed moderate to severe stenosis in the mid to distal left V4 with diminished opacification of the left PICA.  Mild to moderate stenosis in the bilateral cavernous and supraclinoid ICA segments.   Patient was started on Keppra initially but then subsequently discontinued.  Taken off of long-term monitoring. MRI brain did not show any acute findings.  Mental status seems to be stable.  Do not anticipate any further work-up at this time.  Oropharyngeal dysphagia Cleared for pured diet.  Mentation has improved.  Patient will need assistance with meals.  Hypoglycemia  likely due to poor oral intake.  Discussed with nursing staff.  They will assist with her meals today.  Prognosis is guarded at this time.  Patient with numerous comorbidities.  If her dysphagia does not improve then discussions may need to be held with family.  Palliative medicine has been consulted.  Concern for pneumonia on imaging studies CT angiogram head and neck incidentally showed concern for pneumonia.  She might have aspirated.  However she is afebrile.  WBC is normal.  Respiratory effort is normal.  Possibly chemical pneumonitis.  We will hold off on antibiotics for now.    End-stage renal disease on hemodialysis Dialyzed on Monday Wednesday Friday schedule. Nephrology is following.  Hypoglycemia Low glucose levels likely due to poor oral intake.  She was started on D5 infusion with not much improvement in her glucose levels.  Switched over to D10 infusion.  CBG levels have improved.  We will decrease the dose of D10 infusion.  We will see how she does with her oral intake over the next 24 hours.    Orthostatic hypotension Noted to be on midodrine.  Blood pressure is reasonably well controlled.  History of coronary artery disease/history of stroke Noted to be on Lipitor, Plavix. Patient does have left-sided deficits from previous stroke.  Abnormal thyroid function test TSH is elevated.  Free T4 was also noted to be elevated. No known history of thyroid disease.  Recommend that these levels be rechecked in 4 to 6 weeks.  Goals Of care Spoke to patient's son. He mentioned that he wants her to be DNR based on previous conversations. He was told about her low glucose levels and  poor oral intake. Agreeable to palliative care.   DVT Prophylaxis: Subcutaneous heparin Code Status: DNR Family Communication: No family at bedside Disposition Plan: Here from skilled nursing facility.  Anticipate discharge to SNF when medically stable.  Status is: Inpatient Remains inpatient  appropriate because: Altered mental status, concern for seizure activity      Medications: Scheduled:  atorvastatin  80 mg Oral QHS   Chlorhexidine Gluconate Cloth  6 each Topical Q0600   Chlorhexidine Gluconate Cloth  6 each Topical Q0600   cinacalcet  30 mg Oral Q M,W,F-HD   clopidogrel  75 mg Oral Daily   escitalopram  10 mg Oral q morning   heparin  5,000 Units Subcutaneous Q12H   midodrine  10 mg Oral TID WC   multivitamin  1 tablet Oral QHS   mouth rinse  15 mL Mouth Rinse Q2H   pantoprazole (PROTONIX) IV  40 mg Intravenous Q24H   sevelamer carbonate  800 mg Oral TID WC   thiamine (VITAMIN B1) injection  100 mg Intravenous Daily   Or   thiamine  100 mg Oral Daily   Continuous:  dextrose     ZOX:WRUEAVWUJ, hydrOXYzine, LORazepam, LORazepam, mouth rinse  Antibiotics: Anti-infectives (From admission, onward)    None       Objective:  Vital Signs  Vitals:   10/22/22 2332 10/23/22 0100 10/23/22 0330 10/23/22 0814  BP:  120/66  (!) 105/36  Pulse: (!) 102 96 93 89  Resp: 12  15 17   Temp: 97.6 F (36.4 C) 97.9 F (36.6 C) 97.9 F (36.6 C) (!) 97.3 F (36.3 C)  TempSrc: Oral Oral  Axillary  SpO2: 100% 100% 100% 100%  Weight:        Intake/Output Summary (Last 24 hours) at 10/23/2022 0932 Last data filed at 10/22/2022 1336 Gross per 24 hour  Intake 198.15 ml  Output --  Net 198.15 ml    Filed Weights   10/21/22 1115  Weight: 42.4 kg    General appearance: Somnolent but easily arousable.  In no distress. Resp: Clear to auscultation bilaterally.  Normal effort Cardio: S1-S2 is normal regular.  No S3-S4.  No rubs murmurs or bruit GI: Abdomen is soft.  Nontender nondistended.  Bowel sounds are present normal.  No masses organomegaly Extremities: No edema.  Moving all of her extremities    Lab Results:  Data Reviewed: I have personally reviewed following labs and reports of the imaging studies  CBC: Recent Labs  Lab 10/19/22 1005 10/19/22 1355  10/21/22 0501 10/23/22 0407  WBC 10.0  --  7.5 11.0*  NEUTROABS 7.0  --   --   --   HGB 11.9* 11.6* 9.9* 9.3*  HCT 38.9 34.0* 30.0* 29.1*  MCV 106.3*  --  99.7 102.5*  PLT 317  --  182 174     Basic Metabolic Panel: Recent Labs  Lab 10/19/22 1005 10/19/22 1355 10/21/22 0501 10/23/22 0407  NA 142 135 139 134*  K 5.4* 4.9 3.6 3.8  CL 94*  --  98 96*  CO2 20*  --  28 25  GLUCOSE 191*  --  79 125*  BUN 58*  --  30* 22  CREATININE 5.97*  --  4.00* 3.68*  CALCIUM 11.2*  --  10.3 9.7  MG 2.7*  --   --   --      GFR: Estimated Creatinine Clearance: 9.2 mL/min (A) (by C-G formula based on SCr of 3.68 mg/dL (H)).  Liver Function Tests: Recent  Labs  Lab 10/19/22 1005 10/23/22 0407  AST 31 20  ALT 12 12  ALKPHOS 104 86  BILITOT 1.4* 1.5*  PROT 7.0 5.5*  ALBUMIN 3.2* 2.4*     Recent Labs  Lab 10/19/22 1005  LIPASE 44    Recent Labs  Lab 10/19/22 1345  AMMONIA 32     CBG: Recent Labs  Lab 10/22/22 1601 10/22/22 2007 10/22/22 2331 10/23/22 0331 10/23/22 0845  GLUCAP 200* 159* 149* 131* 140*      Radiology Studies: CT ANGIO HEAD NECK W WO CM  Result Date: 10/21/2022 CLINICAL DATA:  Stroke suspected EXAM: CT ANGIOGRAPHY HEAD AND NECK TECHNIQUE: Multidetector CT imaging of the head and neck was performed using the standard protocol during bolus administration of intravenous contrast. Multiplanar CT image reconstructions and MIPs were obtained to evaluate the vascular anatomy. Carotid stenosis measurements (when applicable) are obtained utilizing NASCET criteria, using the distal internal carotid diameter as the denominator. RADIATION DOSE REDUCTION: This exam was performed according to the departmental dose-optimization program which includes automated exposure control, adjustment of the mA and/or kV according to patient size and/or use of iterative reconstruction technique. CONTRAST:  75mL OMNIPAQUE IOHEXOL 350 MG/ML SOLN COMPARISON:  Prior CTA of the head  and neck, correlation is made with CT head 10/19/2022 and MRA head 03/02/2021 FINDINGS: CT HEAD FINDINGS For noncontrast findings, please see 10/19/2022 CT head. CTA NECK FINDINGS Aortic arch: Two-vessel arch with a common origin of the brachiocephalic and left common carotid arteries. Imaged portion shows no evidence of aneurysm or dissection. No significant stenosis of the major arch vessel origins. Aortic atherosclerosis. The proximal left subclavian artery is quite tortuous and has a retrotracheal course (series 7, image 275). Right carotid system: No evidence of dissection, occlusion, or hemodynamically significant stenosis (greater than 50%). Atherosclerotic disease in the distal common carotid, at the bifurcation, and in the proximal ICA is not hemodynamically significant. Left carotid system: No evidence of dissection, occlusion, or hemodynamically significant stenosis (greater than 50%). Atherosclerotic disease in the distal common carotid, at the bifurcation, and in the proximal ICA is not hemodynamically significant. Vertebral arteries: No evidence of dissection, occlusion, or hemodynamically significant stenosis (greater than 50%). Right dominant system. Mild irregularity of the size of the left vertebral artery, without significant stenosis. Skeleton: Widening of the atlantodental interval, which appears chronic. Status post ACDF C3-C4 with evidence of prior fusion C5-C7. 5 mm anterolisthesis of C7 on T1 with severe endplate degenerative changes, unchanged which appears compared to 09/02/2022 chest CT. No acute osseous abnormality. Poor dentition with periapical lucencies about several maxillary teeth. Other neck: No acute finding. Upper chest: Increased tree-in-bud opacities in the right-greater-than-left upper lobe compared to 09/02/2022. Possible fluid in the left fissure and interlobular septal thickening. Review of the MIP images confirms the above findings CTA HEAD FINDINGS Anterior circulation:  Both internal carotid arteries are patent to the termini, with mild-to-moderate stenosis in the bilateral cavernous and supraclinoid ICA segments. A1 segments patent. Normal anterior communicating artery. Anterior cerebral arteries are patent to their distal aspects. No M1 stenosis or occlusion. MCA branches perfused and symmetric. Posterior circulation: Poor opacification of the left V4, likely reflecting moderate to severe stenosis in the mid to distal V4. No hemodynamically significant stenosis in the right vertebral artery. The right vertebral artery is dominant and patent to the vertebrobasilar junction. Posterior inferior cerebellar arteries patent proximally, although there is diminished opacification left PICA. Basilar patent to its distal aspect. Superior cerebellar arteries patent proximally. Patent  P1 segments. PCAs perfused to their distal aspects without stenosis. The right posterior communicating artery is diminutive but patent. Venous sinuses: Not well opacified. Anatomic variants: None significant. Review of the MIP images confirms the above findings IMPRESSION: 1. 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 2. Mild-to-moderate stenosis in the bilateral cavernous and supraclinoid ICA segments. 3. No hemodynamically significant stenosis in the neck. 4. Increased tree-in-bud opacities in the right-greater-than-left upper lobes, concerning for infection or aspiration. 5. Aortic atherosclerosis. Aortic Atherosclerosis (ICD10-I70.0). Electronically Signed   By: Wiliam Ke M.D.   On: 10/21/2022 23:10   MR BRAIN WO CONTRAST  Result Date: 10/21/2022 CLINICAL DATA:  Altered mental status EXAM: MRI HEAD WITHOUT CONTRAST TECHNIQUE: Multiplanar, multiecho pulse sequences of the brain and surrounding structures were obtained without intravenous contrast. COMPARISON:  02/22/2021 FINDINGS: Evaluation is somewhat limited by motion artifact. Brain:  No restricted diffusion with ADC correlate to suggest acute or subacute infarct. There is some T2 shine through in the right parietal and occipital periventricular white matter (series 5, images 76-80 and series 11, images 15-17), which is associated with focal encephalomalacia (series 16, image 37), new from 03/02/2021 and favored to represent now remote interval infarct. No acute hemorrhage, mass, mass effect, or midline shift. Confluence T2 hyperintense signal in the periventricular white matter and pons, likely the sequela of severe chronic small vessel ischemic disease. Advanced cerebral volume loss for age. Remote lacunar infarcts in the pons, bilateral basal ganglia, and thalami. Vascular: Patent arterial flow voids. Skull and upper cervical spine: Normal marrow signal. Status post ACDF C3-C4. Sinuses/Orbits: No acute finding. Other: Fluid in the right-greater-than-left mastoid air cells. IMPRESSION: No acute intracranial process. No evidence of acute or subacute infarct. Electronically Signed   By: Wiliam Ke M.D.   On: 10/21/2022 22:09   Overnight EEG with video  Result Date: 10/21/2022 Charlsie Quest, MD     10/21/2022 11:43 AM Patient Name: CHRISTLE BRUGMAN MRN: [id number] Epilepsy Attending: Charlsie Quest Referring Physician/Provider: Erick Blinks, MD Duration: 10/20/2022 1000 to 10/21/2022 5638  Patient history: 71 year old patient with end-stage renal disease hypertension hyperlipidemia CAD and previous stroke presents with acute onset confusion and hallucinations. EEG to evaluate for seizure  Level of alertness: lethargic, asleep  AEDs during EEG study: LEV  Technical aspects: This EEG study was done with scalp electrodes positioned according to the 10-20 International system of electrode placement. Electrical activity was reviewed with band pass filter of 1-70Hz , sensitivity of 7 uV/mm, display speed of 2mm/sec with a 60Hz  notched filter applied as appropriate. EEG data were recorded  continuously and digitally stored.  Video monitoring was available and reviewed as appropriate.  Description: During awake state, no clear posterior dominant rhythm was seen. Sleep was characterized by sleep spindles (12-14), maximal frontocentral region. EEG showed continuous generalized 3 to 6 Hz theta-delta slowing.  Generalized periodic discharges with triphasic morphology at 1hz  were also noted, more frequent when awake/stimulated.  Hyperventilation and photic stimulation were not performed.    ABNORMALITY - Periodic discharges with triphasic morphology, generalized ( GPDs) - Continuous slow, generalized  IMPRESSION: This study showed generalized periodic discharges with triphasic morphology which are most likely due to toxic-metabolic causes due to the morphology, frequency and reactivity to stimulation. Additionally there was moderate to severe diffuse encephalopathy, nonspecific etiology.  No seizures were seen during the study.  Priyanka O Yadav       LOS: 4 days  [employer]  [facility] [employer] on [email or web address]  10/23/2022, 9:32 AM

## 2022-10-23 NOTE — Consult Note (Signed)
Consultation Note Date: 10/23/2022   Patient Name: [name]  DOB: 07-04-1951  MRN: [id number]  Age / Sex: 71 y.o., female  PCP: [name], MD Referring Physician: Bonnielee Haff, MD  Reason for Consultation: Establishing goals of care  HPI/Patient Profile: 71 y.o. 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  Patient with ongoing acute metabolic encephalopathy.  CT and MRI of head did not show any acute findings.CT angiogram head and neck showed moderate to severe stenosis in the mid to distal left V4 with diminished opacification of the left PICA.  Mild to moderate stenosis in the bilateral cavernous and supraclinoid ICA segments.   Patient also with ongoing poor p.o. intake related to mental status along with dysphagia and need for modified diet.  PMT consulted to discuss goals of care.  Clinical Assessment and Goals of Care: I have reviewed medical records including EPIC notes, labs and imaging, received report from RN, assessed the patient and then spoke with patient's son Quillian Quince to discuss diagnosis prognosis, [place], EOL wishes, disposition and options.  I introduced Palliative Medicine as specialized medical care for people living with serious illness. It focuses on providing relief from the symptoms and stress of a serious illness. The goal is to improve quality of life for both the patient and the family.  Quillian Quince tells me he typically makes medical decisions for patient.  He does have an older brother who is also involved.  Quillian Quince tells me patient has been at nursing facility.  He tells me she is nonambulatory and spends most of her time in bed.  He tells me she eats some but her appetite is definitely decreased.  He tells me he thinks  she is depressed.  He tells me she has been tolerating hemodialysis but it takes a lot out of her.  He tells me she has been through a lot and he knows she is tired.   We discussed patient's current illness and what it means in the larger context of patient's on-going co-morbidities.  Natural disease trajectory and expectations at EOL were discussed.We discussed concern about her PO intake and ongoing AMS. We discussed mental status fluctuates.   I attempted to elicit values and goals of care important to the patient.    The difference between aggressive medical intervention and comfort care was considered in light of the patient's goals of care.   Quillian Quince shares that if PO intake remains poor he would at least like an attempt at artificial nutrition. We discussed uncertainty of how this would go. We discussed may get to a place where transitioning to comfort focused care is recommended.   Discussed with son the importance of continued conversation with family and the medical providers regarding overall plan of care and treatment options, ensuring decisions are within the context of the patients values and GOCs.    Questions and concerns were addressed. The family was encouraged to call with questions or concerns.  Primary Decision Maker NEXT OF KIN    SUMMARY OF RECOMMENDATIONS   Son would like to proceed with trial of tube feeding if recommended PMT to follow  Code Status/Advance Care Planning: DNR      Primary Diagnoses: Present on Admission:  Acute encephalopathy  Hypotension   I have reviewed the medical record, interviewed the patient and family,  and examined the patient. The following aspects are pertinent.  Past Medical History:  Diagnosis Date   Acute ischemic stroke ([place]) 02/23/2017   Acute on chronic diastolic CHF (congestive heart failure) ([place])    Adenomatous polyp of colon 10/2010, 2006, 2015   Anemia in CKD (chronic kidney disease) 11/07/2012   s/p blood  transfusion.    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  Had some early rejection then stable function for years, then had slow decline of function and went back on hemodialysis in 2012.  Gets HD TTS schedule at [facility] on [facility] still using L forearm AVF.   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   Social History   Socioeconomic History   Marital status: Widowed    Spouse name: Not on file   Number of children: 2   Years of education: Not on file   Highest education level: Not on file  Occupational History   Not on file  Tobacco Use   Smoking status: Former    Types: Cigarettes    Quit date: 12/31/1991    Years since quitting: 30.8    Passive exposure: Never   Smokeless tobacco: Never  Vaping Use   Vaping Use: Never used  Substance and Sexual Activity   Alcohol use: No    Alcohol/week: 0.0 standard drinks of alcohol   Drug use: No   Sexual activity: Not Currently    Birth control/protection: Post-menopausal  Other Topics Concern   Not on file  Social History Narrative   Living with her mother    Right handed   Caffeine: only decaf clears ("no brown sodas" or coffee d/t kidney disease)   Low potassium foods d/t kidney disease   Social Determinants of Health   Financial Resource Strain: Not on file  Food Insecurity: Not on file  Transportation Needs: Not on file  Physical Activity: Not on file   Stress: Not on file  Social Connections: Not on file   Family History  Problem Relation Age of Onset   Colon cancer Brother    Cancer Brother    Coronary artery disease Mother 33   Hyperlipidemia Mother    Hypertension Mother    Stroke Maternal Aunt    Esophageal cancer Neg Hx    Stomach cancer Neg Hx    Rectal cancer Neg Hx    Scheduled Meds:  atorvastatin  80 mg Oral QHS   Chlorhexidine Gluconate Cloth  6 each Topical Q0600   Chlorhexidine Gluconate Cloth  6 each Topical Q0600   cinacalcet  30 mg Oral Q M,W,F-HD   clopidogrel  75 mg Oral Daily   escitalopram  10 mg Oral q morning   heparin  5,000 Units Subcutaneous Q12H   midodrine  10 mg Oral TID WC   multivitamin  1 tablet Oral QHS  mouth rinse  15 mL Mouth Rinse Q2H   pantoprazole (PROTONIX) IV  40 mg Intravenous Q24H   sevelamer carbonate  800 mg Oral TID WC   thiamine (VITAMIN B1) injection  100 mg Intravenous Daily   Or   thiamine  100 mg Oral Daily   Continuous Infusions:  dextrose 20 mL/hr at 10/23/22 0935   PRN Meds:.albuterol, hydrOXYzine, LORazepam, LORazepam, mouth rinse Allergies  Allergen Reactions   Sulfa Antibiotics Other (See Comments)    Both pt's parents allergic to this type of medication    Adhesive [Tape] Itching   Review of Systems  Unable to perform ROS: Mental status change    Physical Exam Constitutional:      General: She is not in acute distress.    Appearance: She is ill-appearing.     Comments: Does not respond to voice or gentle touch  Pulmonary:     Effort: Pulmonary effort is normal.  Skin:    General: Skin is warm and dry.  Neurological:     Mental Status: She is disoriented.     Vital Signs: BP 135/62   Pulse 76   Temp 97.9 F (36.6 C) (Oral)   Resp 13   Wt 43.9 kg   SpO2 100%   BMI 17.70 kg/m  Pain Scale: 0-10   Pain Score: 0-No pain   SpO2: SpO2: 100 % O2 Device:SpO2: 100 % O2 Flow Rate: .O2 Flow Rate (L/min): 3 L/min  IO: Intake/output summary:   Intake/Output Summary (Last 24 hours) at 10/23/2022 1320 Last data filed at 10/22/2022 1336 Gross per 24 hour  Intake 198.15 ml  Output --  Net 198.15 ml    LBM: Last BM Date : 10/20/22 Baseline Weight: Weight:  (Unable to obtain) Most recent weight: Weight: 43.9 kg     Palliative Assessment/Data:PPS 20%     *Please note that this is a verbal dictation therefore any spelling or grammatical errors are due to the "[employer]" system interpretation.  Juel Burrow, DNP, AGNP-C Palliative Medicine Team [phone] Pager: [phone]

## 2022-10-23 NOTE — Progress Notes (Signed)
Received patient in bed to unit.  Alert and oriented.  Informed consent signed and in chart.   Treatment initiated: 1252 Treatment completed: 1630  Patient tolerated well.  Transported back to the room  Alert, without acute distress.  Hand-off given to patient's nurse.   Access used: AVFistula Access issues: none  Total UF removed: 700 Medication(s) given: none Post HD VS:120/44,83,15,100%,97.9 Post HD weight: 43.1kg   [facility]

## 2022-10-23 NOTE — Progress Notes (Signed)
Modified Barium Swallow Progress Note  Patient Details  Name: [name] MRN: [id number] Date of Birth: December 17, 1950  Today's Date: 10/23/2022  Modified Barium Swallow completed.  Full report located under Chart Review in the Imaging Section.  Brief recommendations include the following:  Clinical Impression  MBS completed in setting of lethargy, decreased awareness and challenging positiong with pt requiring feeder to hold head in upright position. Noted presence of cervical hardware. Pt demonstrated mild oropharyngeal impairments with transient laryngeal penetration without aspiration observed. Oral phase marked by decreased cohesion and minimally delayed manipulation and transit with lingual residue with thin. She was unable to utilize straw during study and lack of dentition and cognition prohibited assessment of solid texture. Primary impairment of pharyngeal phase was delayed swallow initiation with barium reaching vallecule and pyriform sinuses and remained for range of 8-12 seconds before swallow was triggered. Larger sip of thin was penetrated into vestibule but exited during the study (PAS 2). There was min-mild vallecular and pyriform sinus residue with thin. Overall, strength and mobility of swallow was adequate. Given her lethargy, decreased cognitive ability and noted wet vocal quality prior to study, recommend she continue nectar thick liquids and puree diet, crush meds with full supervision/assit with meals   Swallow Evaluation Recommendations       SLP Diet Recommendations: Nectar thick liquid;Dysphagia 1 (Puree) solids   Liquid Administration via: Cup;Straw   Medication Administration: Crushed with puree   Supervision: Full assist for feeding;Full supervision/cueing for compensatory strategies   Compensations: Minimize environmental distractions;Slow rate;Small sips/bites;Lingual sweep for clearance of pocketing;Monitor for anterior loss   Postural Changes: Seated  upright at 90 degrees   Oral Care Recommendations: Oral care BID        Houston Siren 10/23/2022,11:00 AM

## 2022-10-24 DIAGNOSIS — R946 Abnormal results of thyroid function studies: Secondary | ICD-10-CM | POA: Diagnosis not present

## 2022-10-24 DIAGNOSIS — R1312 Dysphagia, oropharyngeal phase: Secondary | ICD-10-CM | POA: Diagnosis not present

## 2022-10-24 DIAGNOSIS — G9340 Encephalopathy, unspecified: Secondary | ICD-10-CM | POA: Diagnosis not present

## 2022-10-24 LAB — GLUCOSE, CAPILLARY
Glucose-Capillary: 90 mg/dL (ref 70–99)
Glucose-Capillary: 66 mg/dL — ABNORMAL LOW (ref 70–99)
Glucose-Capillary: 79 mg/dL (ref 70–99)
Glucose-Capillary: 106 mg/dL — ABNORMAL HIGH (ref 70–99)
Glucose-Capillary: 141 mg/dL — ABNORMAL HIGH (ref 70–99)
Glucose-Capillary: 140 mg/dL — ABNORMAL HIGH (ref 70–99)
Glucose-Capillary: 135 mg/dL — ABNORMAL HIGH (ref 70–99)
Glucose-Capillary: 118 mg/dL — ABNORMAL HIGH (ref 70–99)

## 2022-10-24 MED ORDER — DEXTROSE 10 % IV SOLN: INTRAVENOUS | Status: DC

## 2022-10-24 NOTE — Plan of Care (Signed)
  Problem: Nutrition: Goal: Adequate nutrition will be maintained Outcome: Progressing   Problem: Coping: Goal: Level of anxiety will decrease Outcome: Progressing   Problem: Elimination: Goal: Will not experience complications related to bowel motility Outcome: Progressing   Problem: Safety: Goal: Ability to remain free from injury will improve Outcome: Progressing   

## 2022-10-24 NOTE — Progress Notes (Signed)
[facility] PROGRESS NOTE   [name] TOI:[id number] DOB: 28-Oct-1951 DOA: 10/19/2022  PCP: [name], MD  Brief History/Interval Summary: 71 y.o. 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 Patient resides at nursing home, and this morning staff noticed patient became confused unresponsive with slurred speech and right-sided gaze, and called EMS. In the ED, patient developed an episode of foaming mouth and drooling and unable to talk, lasted about 1 minute associated with hypoxia and tachycardia.  Also had some reported twitching like movement of bilateral arms, 1 dose of Ativan push given and patient was also loaded with Keppra.     Consultants: Neurology  Procedures: Continuous EEG    Subjective/Interval History: Patient is awake.  Does not answer questions appropriately.  Does not appear to be in any discomfort.  Discussed with nursing staff.     Assessment/Plan:  Acute metabolic encephalopathy/Concern for seizure activity CT head did not show any acute findings.  MRI brain did not show any acute findings.  CT angiogram head and neck showed moderate to severe stenosis in the mid to distal left V4 with diminished opacification of the left PICA.  Mild to moderate stenosis in the bilateral cavernous and supraclinoid ICA segments.   Patient was seen by neurology.  Patient was started on Keppra initially but then subsequently discontinued.  Taken off of long-term monitoring. Mental status seems to be stable.  Do not anticipate any further work-up at this time.  Oropharyngeal dysphagia Cleared for pured diet.  Mentation has improved.  Patient will need assistance with meals.  Hypoglycemia likely due to poor oral intake.  Discussed with  nursing staff.  They will assist with her meals today.  Speech therapy to continue to follow patient.  Hypoglycemia Low glucose levels likely due to poor oral intake.  She was started on D5 infusion with not much improvement in her glucose levels.  Switched over to D10 infusion.  Yesterday D10 infusion was decreased.  Noted to have low glucose levels overnight.  Will increase D10 back to 30 mL/h.  Encourage oral intake today.  Monitor CBGs.   Concern for pneumonia on imaging studies CT angiogram head and neck incidentally showed concern for pneumonia.  She might have aspirated.  However she is afebrile.  WBC is normal.  Respiratory effort is normal.  Possibly chemical pneumonitis.  We will hold off on antibiotics for now.    End-stage renal disease on hemodialysis Dialyzed on Monday Wednesday Friday schedule. Nephrology is following.  Orthostatic hypotension Noted to be on midodrine.  Blood pressure is reasonably well controlled.  History of coronary artery disease/history of stroke Noted to be on Lipitor, Plavix. Patient does have left-sided deficits from previous stroke.  Abnormal thyroid function test TSH is elevated.  Free T4 was also noted to be elevated. No known history of thyroid disease.  Recommend that these levels be rechecked in 4 to 6 weeks.  Goals Of care Patient is DNR.  Palliative care consulted.  They have discussed with patient's son.  He is not opposed to artificial nutrition if needed.    DVT Prophylaxis: Subcutaneous heparin Code Status: DNR Family Communication: No family at bedside Disposition Plan: Here from skilled nursing facility.  Anticipate discharge to SNF when medically stable.  Waiting on oral intake improved.  Status is:  Inpatient Remains inpatient appropriate because: Altered mental status, concern for seizure activity      Medications: Scheduled:  atorvastatin  80 mg Oral QHS   Chlorhexidine Gluconate Cloth  6 each Topical Q0600    Chlorhexidine Gluconate Cloth  6 each Topical Q0600   cinacalcet  30 mg Oral Q M,W,F-HD   clopidogrel  75 mg Oral Daily   escitalopram  10 mg Oral q morning   heparin  5,000 Units Subcutaneous Q12H   midodrine  10 mg Oral TID WC   multivitamin  1 tablet Oral QHS   mouth rinse  15 mL Mouth Rinse Q2H   pantoprazole (PROTONIX) IV  40 mg Intravenous Q24H   sevelamer carbonate  800 mg Oral TID WC   thiamine (VITAMIN B1) injection  100 mg Intravenous Daily   Or   thiamine  100 mg Oral Daily   Continuous:  dextrose 30 mL/hr at 10/24/22 9937   JIR:CVELFYBOF, hydrOXYzine, LORazepam, LORazepam, mouth rinse  Antibiotics: Anti-infectives (From admission, onward)    None       Objective:  Vital Signs  Vitals:   10/23/22 1934 10/24/22 0116 10/24/22 0500 10/24/22 0735  BP: (!) 93/45 (!) 88/41 (!) 85/41 (!) 103/51  Pulse: 86 81 90 86  Resp: '15 16  14  '$ Temp: 97.8 F (36.6 C) 98.1 F (36.7 C) (!) 97.5 F (36.4 C) 98.2 F (36.8 C)  TempSrc: Oral Oral Oral Oral  SpO2: 100% 100% 99% 98%  Weight:        Intake/Output Summary (Last 24 hours) at 10/24/2022 0911 Last data filed at 10/24/2022 0535 Gross per 24 hour  Intake 162.72 ml  Output 700 ml  Net -537.28 ml    Filed Weights   10/21/22 1115 10/23/22 1244 10/23/22 1831  Weight: 42.4 kg 43.9 kg 43.1 kg    General appearance: Somnolent but easily arousable.  In no distress. Resp: Clear to auscultation bilaterally.  Normal effort Cardio: S1-S2 is normal regular.  No S3-S4.  No rubs murmurs or bruit GI: Abdomen is soft.  Nontender nondistended.  Bowel sounds are present normal.  No masses organomegaly Extremities: No edema.       Lab Results:  Data Reviewed: I have personally reviewed following labs and reports of the imaging studies  CBC: Recent Labs  Lab 10/19/22 1005 10/19/22 1355 10/21/22 0501 10/23/22 0407  WBC 10.0  --  7.5 11.0*  NEUTROABS 7.0  --   --   --   HGB 11.9* 11.6* 9.9* 9.3*  HCT 38.9 34.0*  30.0* 29.1*  MCV 106.3*  --  99.7 102.5*  PLT 317  --  182 174     Basic Metabolic Panel: Recent Labs  Lab 10/19/22 1005 10/19/22 1355 10/21/22 0501 10/23/22 0407  NA 142 135 139 134*  K 5.4* 4.9 3.6 3.8  CL 94*  --  98 96*  CO2 20*  --  28 25  GLUCOSE 191*  --  79 125*  BUN 58*  --  30* 22  CREATININE 5.97*  --  4.00* 3.68*  CALCIUM 11.2*  --  10.3 9.7  MG 2.7*  --   --   --      GFR: Estimated Creatinine Clearance: 9.4 mL/min (A) (by C-G formula based on SCr of 3.68 mg/dL (H)).  Liver Function Tests: Recent Labs  Lab 10/19/22 1005 10/23/22 0407  AST 31 20  ALT 12 12  ALKPHOS 104 86  BILITOT 1.4* 1.5*  PROT 7.0 5.5*  ALBUMIN  3.2* 2.4*     Recent Labs  Lab 10/19/22 1005  LIPASE 44    Recent Labs  Lab 10/19/22 1345  AMMONIA 32     CBG: Recent Labs  Lab 10/23/22 0845 10/23/22 1945 10/24/22 0122 10/24/22 0506 10/24/22 0732  GLUCAP 140* 69* 90 66* 79      Radiology Studies: DG Swallowing Func-Speech Pathology  Result Date: 10/23/2022 Table formatting from the original result was not included. Objective Swallowing Evaluation: Type of Study: MBS-Modified Barium Swallow Study  Patient Details Name: DERRISHA FOOS MRN: [id number] Date of Birth: 28-Aug-1951 Today's Date: 10/23/2022 Time: SLP Start Time (ACUTE ONLY): 4656 -SLP Stop Time (ACUTE ONLY): 8127 SLP Time Calculation (min) (ACUTE ONLY): 13 min Past Medical History: Past Medical History: Diagnosis Date  Acute ischemic stroke ([place]) 02/23/2017  Acute on chronic diastolic CHF (congestive heart failure) ([place])   Adenomatous polyp of colon 10/2010, 2006, 2015  Anemia in CKD (chronic kidney disease) 11/07/2012  s/p blood transfusion.   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  Had some early rejection then stable function for years, then had slow decline of function and went back on hemodialysis in 2012.  Gets HD TTS schedule at [facility] still using L forearm AVF.  GERD (gastroesophageal reflux disease)   GI bleed 2017  felt to be ischemic colitis, last colo 2015  Hyperlipidemia   Hypertension   Neuromuscular disorder (HCC)   neuropathy hand and legs  Osteoporosis   Pseudoaneurysm of surgical AV fistula (HCC)   left upper arm Past Surgical History: Past Surgical History: Procedure Laterality Date  ABDOMINAL AORTOGRAM W/LOWER EXTREMITY N/A 03/26/2022  Procedure: ABDOMINAL AORTOGRAM W/LOWER EXTREMITY;  Surgeon: Marty Heck, MD;  Location: [place] [facility];  Service: Cardiovascular;  Laterality: N/A;  Left  AV FISTULA PLACEMENT    for dialysis  AV FISTULA PLACEMENT Left 11/22/2015  Procedure: ARTERIOVENOUS (AV) FISTULA CREATION-LEFT BRACHIOCEPHALIC;  Surgeon: Serafina Mitchell, MD;  Location: [place];  Service: Vascular;  Laterality: Left;  AV FISTULA PLACEMENT Right 03/15/2020  Procedure: INSERTION OF ARTERIOVENOUS (AV) GORE-TEX GRAFT ARM ( BRACHIAL AXILLARY );  Surgeon: Katha Cabal, MD;  Location: [facility];  Service: Vascular;  Laterality: Right;  AV FISTULA PLACEMENT Right 05/15/2022  Procedure: EXPLORATION  OF  RIGHT ARM ARTERIOVENOUS GORE-TEX GRAFT;  Surgeon: Angelia Mould, MD;  Location: [place];  Service: Vascular;  Laterality: Right;  [place] REMOVAL Right 04/27/2022  Procedure: REVISION OF RIGHT ARTERIOVENOUS GRAFT AND EXCISION OF RIGHT DEGENERATED GRAFT (AVG);  Surgeon: Angelia Mould, MD;  Location: [place];  Service: Vascular;  Laterality: Right;  BACK SURGERY    CERVICAL FUSION    CHOLECYSTECTOMY  12/02/2012  Procedure: LAPAROSCOPIC CHOLECYSTECTOMY WITH INTRAOPERATIVE CHOLANGIOGRAM;  Surgeon: Edward Jolly, MD;  Location: [place];   Service: General;  Laterality: N/A;  EYE SURGERY Bilateral   cataract surgery  EYE SURGERY Left 2019  laser  HEMATOMA EVACUATION Left 12/24/2016  Procedure: EVACUATION HEMATOMA LEFT UPPER ARM;  Surgeon: Waynetta [place], MD;  Location: [place];  Service: Vascular;  Laterality: Left;  I & D EXTREMITY Left 12/31/2016  Procedure: IRRIGATION AND DEBRIDEMENT EXTREMITY;  Surgeon:  Angelia Mould, MD;  Location: [place];  Service: Vascular;  Laterality: Left;  INSERTION OF DIALYSIS CATHETER Right 12/24/2016  Procedure: INSERTION OF DIALYSIS CATHETER;  Surgeon: Waynetta [place], MD;  Location: [place];  Service: Vascular;  Laterality: Right;  INSERTION OF DIALYSIS CATHETER Right 02/04/2017  Procedure: INSERTION OF DIALYSIS CATHETER;  Surgeon: Waynetta [place], MD;  Location: [place];  Service: Vascular;  Laterality: Right;  [place] Left 10/23/2016  Procedure: Fistulagram;  Surgeon: Elam Dutch, MD;  Location: [place] [facility];  Service: Cardiovascular;  Laterality: Left;  PERIPHERAL VASCULAR INTERVENTION  03/26/2022  Procedure: PERIPHERAL VASCULAR INTERVENTION;  Surgeon: Marty Heck, MD;  Location: [place] [facility];  Service: Cardiovascular;;  left sfa  PERIPHERAL VASCULAR THROMBECTOMY Right 04/16/2020  Procedure: PERIPHERAL VASCULAR THROMBECTOMY;  Surgeon: Katha Cabal, MD;  Location: [place] [facility];  Service: Cardiovascular;  Laterality: Right;  RESECTION OF ARTERIOVENOUS FISTULA ANEURYSM Left 11/22/2015  Procedure: RESECTION OF LEFT RADIOCEPHALIC FISTULA ANEURYSM ;  Surgeon: Serafina Mitchell, MD;  Location: [facility] [facility];  Service: Vascular;  Laterality: Left;  REVISON OF ARTERIOVENOUS FISTULA Left 12/22/2016  Procedure: REVISON OF LEFT ARTERIOVENOUS FISTULA;  Surgeon: Waynetta [place], MD;  Location: [place];  Service: Vascular;  Laterality: Left;  REVISON OF ARTERIOVENOUS FISTULA Left 02/04/2017  Procedure: REVISON OF LEFT UPPER  ARM ARTERIOVENOUS FISTULA;  Surgeon: Waynetta Sandy, MD;  Location: [place];  Service: Vascular;  Laterality: Left;  UPPER EXTREMITY ANGIOGRAPHY Bilateral 09/19/2019  Procedure: UPPER EXTREMITY ANGIOGRAPHY;  Surgeon: Katha Cabal, MD;  Location: [place] [facility];  Service: Cardiovascular;  Laterality: Bilateral; HPI: Pt is a 71 yo female admitted from SNF with AMS. Per neurology consult note, differential dx may include seizures, toxic metabolic encephalopathy, or dementia related cognitive fluctuation. Work up currently underway, with continuous EEG started and MRI pending. Previous swallow eval in August 2021 recommending regular diet and thin liquids but with concern for esophageal component (eructation, c/o pain in her stomach after eating, multiple swallows). 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  Recommendations may be updated based on patient status, additional functional criteria and insurance authorization. Assessment / Plan / Recommendation   10/23/2022  10:30 AM Clinical Impressions Clinical Impression MBS completed in setting of lethargy, decreased awareness and challenging positiong with pt requiring feeder to hold head in upright position. Noted presence of cervical hardware. Pt demonstrated mild oropharyngeal impairments with transient laryngeal penetration without aspiration observed. Oral phase marked by decreased cohesion and minimally delayed manipulation and transit with lingual residue with thin. She was unable to utilize straw during study and lack of dentition and cognition prohibited assessment of solid texture.  Primary impairment of pharyngeal phase was delayed swallow initiation with barium reaching vallecule and pyriform sinuses and remained for range of 8-12 seconds before swallow was triggered. Larger sip of thin was penetrated into vestibule but exited during the study (PAS 2). There was min-mild vallecular and pyriform sinus residue with thin. Overall, strength and mobility of swallow was adequate. Given her lethargy, decreased cognitive ability and noted wet vocal quality prior to study, recommend she continue nectar thick liquids and  puree diet, crush meds with full supervision/assit with meals SLP Visit Diagnosis Dysphagia, oropharyngeal phase (R13.12) Impact on safety and function Moderate aspiration risk     10/23/2022  10:30 AM Treatment Recommendations Treatment Recommendations Therapy as outlined in treatment plan below     10/23/2022  10:30 AM Prognosis Prognosis for Safe Diet Advancement Good Barriers to Reach Goals Cognitive deficits   10/23/2022  10:30 AM Diet Recommendations SLP Diet Recommendations Nectar thick liquid;Dysphagia 1 (Puree) solids Liquid Administration via Cup;Straw Medication Administration Crushed with puree Compensations Minimize environmental distractions;Slow rate;Small sips/bites;Lingual sweep for clearance of pocketing;Monitor for anterior loss Postural Changes Seated upright at 90 degrees     10/23/2022  10:30 AM Other Recommendations Oral Care Recommendations Oral care BID Follow Up Recommendations Skilled nursing-short term rehab (<3 hours/day) Assistance recommended at discharge Frequent or constant Supervision/Assistance Functional Status Assessment Patient has had a recent decline in their functional status and demonstrates the ability to make significant improvements in function in a reasonable and predictable amount of time.   10/23/2022  10:30 AM Frequency and Duration  Speech Therapy Frequency (ACUTE ONLY) min 2x/week Treatment Duration 2 weeks     10/23/2022  10:30 AM  Oral Phase Oral Phase Impaired Oral - Nectar Teaspoon Decreased bolus cohesion;Delayed oral transit;Reduced posterior propulsion Oral - Nectar Cup Delayed oral transit;Decreased bolus cohesion;Reduced posterior propulsion Oral - Thin Cup Decreased bolus cohesion;Lingual/palatal residue;Holding of bolus Oral - Puree Weak lingual manipulation;Reduced posterior propulsion;Delayed oral transit    10/23/2022  10:30 AM Pharyngeal Phase Pharyngeal Phase Impaired Pharyngeal- Nectar Teaspoon Delayed swallow initiation-vallecula Pharyngeal- Nectar Cup Delayed swallow initiation-vallecula;Delayed swallow initiation-pyriform sinuses Pharyngeal- Thin Cup Delayed swallow initiation-pyriform sinuses;Delayed swallow initiation-vallecula;Penetration/Aspiration during swallow;Pharyngeal residue - pyriform;Pharyngeal residue - valleculae Pharyngeal Material enters airway, remains ABOVE vocal cords then ejected out Pharyngeal- Puree Delayed swallow initiation-vallecula    10/23/2022  10:30 AM Cervical Esophageal Phase  Cervical Esophageal Phase WFL Houston Siren 10/23/2022, 10:59 AM                         LOS: 5 days   [facility] Pager on [email or web address]  10/24/2022, 9:11 AM

## 2022-10-24 NOTE — Progress Notes (Signed)
Patient ID: [name], female   DOB: 02-Jun-1951, 71 y.o.   MRN: [id number] [facility] Progress Note    Subjective:   Patient is stable.  Denies any complaints.   Objective:   BP (!) 146/34 (BP Location: Left Leg)   Pulse 82   Temp 98.2 F (36.8 C) (Oral)   Resp 14   Wt 43.1 kg   SpO2 98%   BMI 17.38 kg/m   Physical Exam: Gen: Resting today, no distress CVS: Normal rate, no edema Resp: Bilateral chest rise with no creased work of breathing Abd: Soft, scaphoid, bowel sounds normal Ext: Warm and well perfused.  Hyper pulsatile left upper arm AV fistula with distended cephalic vein to her chest.  OP HD: [place] MWF 4h  350/600  44.8kg  2/2 bath  AVG 16g  Hep none - mircera 50 q4, last 11/3, due 11/17 - hectorol 50 ug IV tiw - sensipar 20 mg po tiw   Assessment/ Plan:   1.  Altered mental status: With earlier evidence of seizure like activity for which she underwent neurology evaluation and EEG that showed generalized triphasic morphology discharges consistent with nonspecific moderate to severe diffuse metabolic encephalopathy.  2. ESRD: She is usually on a Monday/Wednesday/Friday dialysis schedule.  Plan to maintain schedule for now.  3. Anemia: Downtrending hemoglobin and hematocrit noted without any overt losses.  Hemoglobin 9.3 today.  CTM closely 4. CKD-MBD: Earlier hypercalcemia noted for which Hectorol was appropriately discontinued.  Continue Cinacalcet for PTH control and potentially mitigation of hypercalcemia. 5. Nutrition: advancing diet as able 6. Hypotension: Blood pressure under decent control with ongoing midodrine.  Ultrafiltration somewhat limited with marginal hypotension. 7. DNR  Kelly Splinter, MD 10/24/2022, 1:48 PM  Recent Labs  Lab 10/19/22 1005 10/19/22 1355 10/21/22 0501 10/23/22 0407  HGB 11.9*   < > 9.9* 9.3*  ALBUMIN 3.2*  --   --  2.4*  CALCIUM 11.2*  --  10.3 9.7  CREATININE 5.97*  --  4.00* 3.68*  K 5.4*   < > 3.6 3.8    < > = values in this interval not displayed.    Inpatient medications:  atorvastatin  80 mg Oral QHS   Chlorhexidine Gluconate Cloth  6 each Topical Q0600   Chlorhexidine Gluconate Cloth  6 each Topical Q0600   cinacalcet  30 mg Oral Q M,W,F-HD   clopidogrel  75 mg Oral Daily   escitalopram  10 mg Oral q morning   heparin  5,000 Units Subcutaneous Q12H   midodrine  10 mg Oral TID WC   multivitamin  1 tablet Oral QHS   mouth rinse  15 mL Mouth Rinse Q2H   pantoprazole (PROTONIX) IV  40 mg Intravenous Q24H   sevelamer carbonate  800 mg Oral TID WC   thiamine (VITAMIN B1) injection  100 mg Intravenous Daily   Or   thiamine  100 mg Oral Daily    dextrose 30 mL/hr at 10/24/22 0918   albuterol, hydrOXYzine, LORazepam, LORazepam, mouth rinse

## 2022-10-25 DIAGNOSIS — G9340 Encephalopathy, unspecified: Secondary | ICD-10-CM | POA: Diagnosis not present

## 2022-10-25 DIAGNOSIS — R946 Abnormal results of thyroid function studies: Secondary | ICD-10-CM | POA: Diagnosis not present

## 2022-10-25 DIAGNOSIS — R1312 Dysphagia, oropharyngeal phase: Secondary | ICD-10-CM | POA: Diagnosis not present

## 2022-10-25 LAB — RENAL FUNCTION PANEL
Albumin: 2.2 g/dL — ABNORMAL LOW (ref 3.5–5.0)
Anion gap: 10 (ref 5–15)
BUN: 31 mg/dL — ABNORMAL HIGH (ref 8–23)
CO2: 28 mmol/L (ref 22–32)
Calcium: 9.1 mg/dL (ref 8.9–10.3)
Chloride: 93 mmol/L — ABNORMAL LOW (ref 98–111)
Creatinine, Ser: 3.73 mg/dL — ABNORMAL HIGH (ref 0.44–1.00)
GFR, Estimated: 12 mL/min — ABNORMAL LOW (ref >60)
Glucose, Bld: 92 mg/dL (ref 70–99)
Phosphorus: 3.1 mg/dL (ref 2.5–4.6)
Potassium: 4.1 mmol/L (ref 3.5–5.1)
Sodium: 131 mmol/L — ABNORMAL LOW (ref 135–145)

## 2022-10-25 LAB — GLUCOSE, CAPILLARY
Glucose-Capillary: 109 mg/dL — ABNORMAL HIGH (ref 70–99)
Glucose-Capillary: 113 mg/dL — ABNORMAL HIGH (ref 70–99)
Glucose-Capillary: 114 mg/dL — ABNORMAL HIGH (ref 70–99)
Glucose-Capillary: 126 mg/dL — ABNORMAL HIGH (ref 70–99)
Glucose-Capillary: 87 mg/dL (ref 70–99)
Glucose-Capillary: 97 mg/dL (ref 70–99)

## 2022-10-25 LAB — CBC
HCT: 29.1 % — ABNORMAL LOW (ref 36.0–46.0)
Hemoglobin: 9.5 g/dL — ABNORMAL LOW (ref 12.0–15.0)
MCH: 32.5 pg (ref 26.0–34.0)
MCHC: 32.6 g/dL (ref 30.0–36.0)
MCV: 99.7 fL (ref 80.0–100.0)
Platelets: 258 10*3/uL (ref 150–400)
RBC: 2.92 MIL/uL — ABNORMAL LOW (ref 3.87–5.11)
RDW: 17.8 % — ABNORMAL HIGH (ref 11.5–15.5)
WBC: 9.7 10*3/uL (ref 4.0–10.5)
nRBC: 0 % (ref 0.0–0.2)

## 2022-10-25 MED ORDER — PANTOPRAZOLE SODIUM 40 MG PO TBEC
40.0000 mg | DELAYED_RELEASE_TABLET | Freq: Every day | ORAL | Status: DC
  Administered 2022-10-26 – 2022-10-27 (×2): 40 mg via ORAL
  Filled 2022-10-25 (×2): qty 1

## 2022-10-25 MED ORDER — DEXTROSE 10 % IV SOLN: INTRAVENOUS | Status: AC

## 2022-10-25 MED ORDER — CHLORHEXIDINE GLUCONATE CLOTH 2 % EX PADS
6.0000 | MEDICATED_PAD | Freq: Every day | CUTANEOUS | Status: DC
  Administered 2022-10-26 – 2022-10-27 (×2): 6 via TOPICAL

## 2022-10-25 NOTE — Plan of Care (Signed)
  Problem: Clinical Measurements: Goal: Will remain free from infection Outcome: Progressing Goal: Respiratory complications will improve Outcome: Progressing Goal: Cardiovascular complication will be avoided Outcome: Progressing   Problem: Activity: Goal: Risk for activity intolerance will decrease Outcome: Progressing   Problem: Nutrition: Goal: Adequate nutrition will be maintained Outcome: Progressing   Problem: Coping: Goal: Level of anxiety will decrease Outcome: Progressing

## 2022-10-25 NOTE — Progress Notes (Signed)
Patient ID: [name], female   DOB: Sep 07, 1951, 71 y.o.   MRN: [id number] [facility] Progress Note    Subjective:   Patient is stable.  Labs stable today.    Objective:   BP (!) 146/34 (BP Location: Left Leg)   Pulse 82   Temp 98.2 F (36.8 C) (Oral)   Resp 14   Wt 43.1 kg   SpO2 98%   BMI 17.38 kg/m   Physical Exam: Gen: Resting today, no distress CVS: Normal rate, no edema Resp: Bilateral chest rise with no creased work of breathing Abd: Soft, scaphoid, bowel sounds normal Ext: Warm and well perfused Hyper pulsatile left upper arm AVG with distended cephalic vein to her chest. Neuro: nonfocal, Ox 2  OP HD: [place] MWF 4h  350/600  44.8kg  2/2 bath  LUA AVG 16g  Hep none - mircera 50 q4, last 11/3, due 11/17 - hectorol 50 ug IV tiw - sensipar 20 mg po tiw   Assessment/ Plan:   1.  Altered mental status: With earlier evidence of seizure like activity for which she underwent neurology evaluation and EEG that showed generalized triphasic morphology discharges consistent with nonspecific moderate to severe diffuse metabolic encephalopathy.  2. ESRD: She is usually on a Monday/Wednesday/Friday dialysis schedule.  HD tomorrow.  3. Anemia: Downtrending hemoglobin and hematocrit noted without any overt losses.  Hemoglobin 9.3 today.  CTM closely 4. CKD-MBD: Earlier hypercalcemia noted for which Hectorol was appropriately discontinued.  Continue Cinacalcet for PTH control and potentially mitigation of hypercalcemia. 5. Nutrition: advancing diet as able 6. Hypotension/ volume: chronic issue, getting '10mg'$  midodrine tid here as at home. BP's labile, up and down, mostly stable. Cont midodrine. 1-2 kg under dry wt, no vol ^ on exam. UF 1.5- 2 L w/ next HD.  7. DNR  Kelly Splinter, MD 10/25/2022, 4:40 PM  Recent Labs  Lab 10/23/22 0407 10/25/22 1202  HGB 9.3* 9.5*  ALBUMIN 2.4* 2.2*  CALCIUM 9.7 9.1  PHOS  --  3.1  CREATININE 3.68* 3.73*  K 3.8 4.1      Inpatient medications:  atorvastatin  80 mg Oral QHS   Chlorhexidine Gluconate Cloth  6 each Topical Q0600   Chlorhexidine Gluconate Cloth  6 each Topical Q0600   cinacalcet  30 mg Oral Q M,W,F-HD   clopidogrel  75 mg Oral Daily   escitalopram  10 mg Oral q morning   heparin  5,000 Units Subcutaneous Q12H   midodrine  10 mg Oral TID WC   multivitamin  1 tablet Oral QHS   mouth rinse  15 mL Mouth Rinse Q2H   pantoprazole  40 mg Oral Daily   sevelamer carbonate  800 mg Oral TID WC   thiamine (VITAMIN B1) injection  100 mg Intravenous Daily   Or   thiamine  100 mg Oral Daily    dextrose 10 mL/hr at 10/25/22 1127   albuterol, hydrOXYzine, LORazepam, LORazepam, mouth rinse

## 2022-10-25 NOTE — Progress Notes (Signed)
[facility] PROGRESS NOTE   [name] GYI:[id number] DOB: Aug 16, 1951 DOA: 10/19/2022  PCP: [name], MD  Brief History/Interval Summary: 71 y.o. 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 Patient resides at nursing home, and this morning staff noticed patient became confused unresponsive with slurred speech and right-sided gaze, and called EMS. In the ED, patient developed an episode of foaming mouth and drooling and unable to talk, lasted about 1 minute associated with hypoxia and tachycardia.  Also had some reported twitching like movement of bilateral arms, 1 dose of Ativan push given and patient was also loaded with Keppra.     Consultants: Neurology  Procedures: Continuous EEG    Subjective/Interval History: Patient is awake alert.  Noted to have a slight cough this morning.  Denies any shortness of breath.  No chest pain.     Assessment/Plan:  Acute metabolic encephalopathy/Concern for seizure activity CT head did not show any acute findings.  MRI brain did not show any acute findings.  CT angiogram head and neck showed moderate to severe stenosis in the mid to distal left V4 with diminished opacification of the left PICA.  Mild to moderate stenosis in the bilateral cavernous and supraclinoid ICA segments.   Patient was seen by neurology.  Patient was started on Keppra initially but then subsequently discontinued.  Taken off of long-term monitoring. Mental status seems to be stable.  Do not anticipate any further work-up at this time.  Oropharyngeal dysphagia Cleared for pured diet.  Mentation has improved.  Patient will need assistance with meals.  Hypoglycemia likely due to poor oral intake.  Seems to have done a better job with  eating yesterday after she was assisted with her meals.  Continue to monitor. Speech therapy to continue to follow patient.  Hypoglycemia Low glucose levels likely due to poor oral intake.  She was started on D5 infusion with not much improvement in her glucose levels.  Switched over to D10 infusion.  Since oral intake appears to be improving we can try to decrease the rate of D10 again today.  Continue to monitor CBGs.  Concern for pneumonia on imaging studies CT angiogram head and neck incidentally showed concern for pneumonia.  She might have aspirated.  However she is afebrile.  WBC is normal.  Respiratory effort is normal.  Possibly chemical pneumonitis.  She was not prescribed any antibiotics. Noted to have cough this morning.  Saturations noted to be okay on room air.  Monitor for now.  If cough persists then will repeat a chest x-ray.    End-stage renal disease on hemodialysis Dialyzed on Monday Wednesday Friday schedule. Nephrology is following.  Orthostatic hypotension Noted to be on midodrine.  Blood pressure is reasonably well controlled.  History of coronary artery disease/history of stroke Noted to be on Lipitor, Plavix. Patient does have left-sided deficits from previous stroke.  Abnormal thyroid function test TSH is elevated.  Free T4 was also noted to be elevated. No known history of thyroid disease.  Recommend that these levels be rechecked in 4 to 6 weeks.  Goals Of care Patient is DNR.  Palliative care consulted.  They have discussed with patient's son.  He is not opposed to artificial nutrition if needed.  Hopefully we can avoid artificial nutrition since she seems to be  tolerating her diet.  DVT Prophylaxis: Subcutaneous heparin Code Status: DNR Family Communication: No family at bedside.  We will update son later today. Disposition Plan: Here from skilled nursing facility.  Anticipate discharge to SNF when medically stable.  Waiting on oral intake  improved.  Status is: Inpatient Remains inpatient appropriate because: Altered mental status, concern for seizure activity      Medications: Scheduled:  atorvastatin  80 mg Oral QHS   Chlorhexidine Gluconate Cloth  6 each Topical Q0600   Chlorhexidine Gluconate Cloth  6 each Topical Q0600   cinacalcet  30 mg Oral Q M,W,F-HD   clopidogrel  75 mg Oral Daily   escitalopram  10 mg Oral q morning   heparin  5,000 Units Subcutaneous Q12H   midodrine  10 mg Oral TID WC   multivitamin  1 tablet Oral QHS   mouth rinse  15 mL Mouth Rinse Q2H   pantoprazole (PROTONIX) IV  40 mg Intravenous Q24H   sevelamer carbonate  800 mg Oral TID WC   thiamine (VITAMIN B1) injection  100 mg Intravenous Daily   Or   thiamine  100 mg Oral Daily   Continuous:  dextrose 30 mL/hr at 10/24/22 0918   ZDG:[id number], hydrOXYzine, LORazepam, LORazepam, mouth rinse  Antibiotics: Anti-infectives (From admission, onward)    None       Objective:  Vital Signs  Vitals:   10/24/22 2005 10/24/22 2319 10/25/22 0310 10/25/22 0728  BP:  116/77 (!) 83/48 (!) 121/50  Pulse: 83 89 81 76  Resp: '16 16 16 16  '$ Temp: 98.9 F (37.2 C) 98.7 F (37.1 C) 98.2 F (36.8 C) 98.1 F (36.7 C)  TempSrc: Oral Axillary Oral Oral  SpO2: 93% 98% 97% 100%  Weight:        Intake/Output Summary (Last 24 hours) at 10/25/2022 0915 Last data filed at 10/24/2022 2100 Gross per 24 hour  Intake 800 ml  Output 0 ml  Net 800 ml    Filed Weights   10/21/22 1115 10/23/22 1244 10/23/22 1831  Weight: 42.4 kg 43.9 kg 43.1 kg    General appearance: Awake alert.  In no distress Resp: Furred.  Coarse breath sounds bilaterally.  No wheezing or rhonchi.  No definite crackles. Cardio: S1-S2 is normal regular.  No S3-S4.  No rubs murmurs or bruit GI: Abdomen is soft.  Nontender nondistended.  Bowel sounds are present normal.  No masses organomegaly Extremities: No edema.       Lab Results:  Data Reviewed: I have personally  reviewed following labs and reports of the imaging studies  CBC: Recent Labs  Lab 10/19/22 1005 10/19/22 1355 10/21/22 0501 10/23/22 0407  WBC 10.0  --  7.5 11.0*  NEUTROABS 7.0  --   --   --   HGB 11.9* 11.6* 9.9* 9.3*  HCT 38.9 34.0* 30.0* 29.1*  MCV 106.3*  --  99.7 102.5*  PLT 317  --  182 174     Basic Metabolic Panel: Recent Labs  Lab 10/19/22 1005 10/19/22 1355 10/21/22 0501 10/23/22 0407  NA 142 135 139 134*  K 5.4* 4.9 3.6 3.8  CL 94*  --  98 96*  CO2 20*  --  28 25  GLUCOSE 191*  --  79 125*  BUN 58*  --  30* 22  CREATININE 5.97*  --  4.00* 3.68*  CALCIUM 11.2*  --  10.3 9.7  MG 2.7*  --   --   --  GFR: Estimated Creatinine Clearance: 9.4 mL/min (A) (by C-G formula based on SCr of 3.68 mg/dL (H)).  Liver Function Tests: Recent Labs  Lab 10/19/22 1005 10/23/22 0407  AST 31 20  ALT 12 12  ALKPHOS 104 86  BILITOT 1.4* 1.5*  PROT 7.0 5.5*  ALBUMIN 3.2* 2.4*     Recent Labs  Lab 10/19/22 1005  LIPASE 44    Recent Labs  Lab 10/19/22 1345  AMMONIA 32     CBG: Recent Labs  Lab 10/24/22 1712 10/24/22 1937 10/24/22 2321 10/25/22 0313 10/25/22 0730  GLUCAP 140* 135* 118* 126* 97      Radiology Studies: DG Swallowing Func-Speech Pathology  Result Date: 10/23/2022 Table formatting from the original result was not included. Objective Swallowing Evaluation: Type of Study: MBS-Modified Barium Swallow Study  Patient Details Name: HORTENCE CHARTER MRN: [id number] Date of Birth: [birth year] Today's Date: 10/23/2022 Time: SLP Start Time (ACUTE ONLY): 9892 -SLP Stop Time (ACUTE ONLY): 1194 SLP Time Calculation (min) (ACUTE ONLY): 13 min Past Medical History: Past Medical History: Diagnosis Date  Acute ischemic stroke ([place]) 02/23/2017  Acute on chronic diastolic CHF (congestive heart failure) ([place])   Adenomatous polyp of colon 10/2010, 2006, 2015  Anemia in CKD (chronic kidney disease) 11/07/2012  s/p blood transfusion.   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  Had some early rejection then stable function for years, then had slow decline of function and went back on hemodialysis in 2012.  Gets HD TTS schedule at [facility] on [facility] still using L forearm AVF.  GERD (gastroesophageal reflux disease)   GI bleed 2017  felt to be ischemic colitis, last colo 2015  Hyperlipidemia   Hypertension   Neuromuscular disorder (HCC)   neuropathy hand and legs  Osteoporosis   Pseudoaneurysm of surgical AV fistula (HCC)   left upper arm Past Surgical History: Past Surgical History: Procedure Laterality Date  ABDOMINAL AORTOGRAM W/LOWER EXTREMITY N/A 03/26/2022  Procedure: ABDOMINAL AORTOGRAM W/LOWER EXTREMITY;  Surgeon: Marty Heck, MD;  Location: [place] [facility];  Service: Cardiovascular;  Laterality: N/A;  Left  AV FISTULA PLACEMENT    for dialysis  AV FISTULA PLACEMENT Left 11/22/2015  Procedure: ARTERIOVENOUS (AV) FISTULA CREATION-LEFT BRACHIOCEPHALIC;  Surgeon: Serafina Mitchell, MD;  Location: [place];  Service: Vascular;  Laterality: Left;  AV FISTULA PLACEMENT Right 03/15/2020  Procedure: INSERTION OF ARTERIOVENOUS (AV) GORE-TEX GRAFT ARM ( BRACHIAL AXILLARY );  Surgeon: Katha Cabal, MD;  Location: [facility];  Service: Vascular;  Laterality: Right;  AV FISTULA PLACEMENT Right 05/15/2022  Procedure: EXPLORATION  OF  RIGHT ARM ARTERIOVENOUS GORE-TEX GRAFT;  Surgeon: Angelia Mould, MD;  Location: [place];  Service: Vascular;  Laterality: Right;  [place] REMOVAL Right 04/27/2022  Procedure: REVISION OF RIGHT ARTERIOVENOUS GRAFT AND EXCISION OF RIGHT DEGENERATED GRAFT (AVG);  Surgeon: Angelia Mould, MD;   Location: [place];  Service: Vascular;  Laterality: Right;  BACK SURGERY    CERVICAL FUSION    CHOLECYSTECTOMY  12/02/2012  Procedure: LAPAROSCOPIC CHOLECYSTECTOMY WITH INTRAOPERATIVE CHOLANGIOGRAM;  Surgeon: Edward Jolly, MD;  Location: [place];  Service: General;  Laterality: N/A;  EYE SURGERY Bilateral   cataract  surgery  EYE SURGERY Left 2019  laser  HEMATOMA EVACUATION Left 12/24/2016  Procedure: EVACUATION HEMATOMA LEFT UPPER ARM;  Surgeon: Waynetta Sandy, MD;  Location: [place];  Service: Vascular;  Laterality: Left;  I & D EXTREMITY Left 12/31/2016  Procedure: IRRIGATION AND DEBRIDEMENT EXTREMITY;  Surgeon: Angelia Mould, MD;  Location: [place];  Service: Vascular;  Laterality: Left;  INSERTION OF DIALYSIS CATHETER Right 12/24/2016  Procedure: INSERTION OF DIALYSIS CATHETER;  Surgeon: Waynetta Sandy, MD;  Location: [place];  Service: Vascular;  Laterality: Right;  INSERTION OF DIALYSIS CATHETER Right 02/04/2017  Procedure: INSERTION OF DIALYSIS CATHETER;  Surgeon: Waynetta Sandy, MD;  Location: [place];  Service: Vascular;  Laterality: Right;  [place] Left 10/23/2016  Procedure: Fistulagram;  Surgeon: Elam Dutch, MD;  Location: [place] [facility];  Service: Cardiovascular;  Laterality: Left;  PERIPHERAL VASCULAR INTERVENTION  03/26/2022  Procedure: PERIPHERAL VASCULAR INTERVENTION;  Surgeon: Marty Heck, MD;  Location: [place] [facility];  Service: Cardiovascular;;  left sfa  PERIPHERAL VASCULAR THROMBECTOMY Right 04/16/2020  Procedure: PERIPHERAL VASCULAR THROMBECTOMY;  Surgeon: Katha Cabal, MD;  Location: [place] [facility];  Service: Cardiovascular;  Laterality: Right;  RESECTION OF ARTERIOVENOUS FISTULA ANEURYSM Left 11/22/2015  Procedure: RESECTION OF LEFT RADIOCEPHALIC FISTULA ANEURYSM ;  Surgeon: Serafina Mitchell, MD;  Location: [facility] [facility];  Service: Vascular;  Laterality: Left;  REVISON OF ARTERIOVENOUS  FISTULA Left 12/22/2016  Procedure: REVISON OF LEFT ARTERIOVENOUS FISTULA;  Surgeon: Waynetta Sandy, MD;  Location: [place];  Service: Vascular;  Laterality: Left;  REVISON OF ARTERIOVENOUS FISTULA Left 02/04/2017  Procedure: REVISON OF LEFT UPPER ARM ARTERIOVENOUS FISTULA;  Surgeon: Waynetta Sandy, MD;  Location: [place];  Service: Vascular;  Laterality: Left;  UPPER EXTREMITY ANGIOGRAPHY Bilateral 09/19/2019  Procedure: UPPER EXTREMITY ANGIOGRAPHY;  Surgeon: Katha Cabal, MD;  Location: [place] [facility];  Service: Cardiovascular;  Laterality: Bilateral; HPI: Pt is a 71 yo female admitted from SNF with AMS. Per neurology consult note, differential dx may include seizures, toxic metabolic encephalopathy, or dementia related cognitive fluctuation. Work up currently underway, with continuous EEG started and MRI pending. Previous swallow eval in August 2021 recommending regular diet and thin liquids but with concern for esophageal component (eructation, c/o pain in her stomach after eating, multiple swallows). 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  Recommendations may be updated based on patient status, additional functional criteria and insurance authorization. Assessment / Plan / Recommendation   10/23/2022  10:30 AM Clinical Impressions Clinical Impression MBS completed in setting of lethargy, decreased awareness and challenging positiong with pt requiring feeder to hold head in upright position. Noted presence of cervical hardware. Pt demonstrated mild oropharyngeal impairments with transient laryngeal penetration without aspiration  observed. Oral phase marked by decreased cohesion and minimally delayed manipulation and transit with lingual residue with thin. She was unable to utilize straw during study and lack of dentition and cognition prohibited assessment of solid texture. Primary impairment of pharyngeal phase was delayed swallow initiation with barium reaching vallecule and pyriform sinuses and remained for range of 8-12 seconds before swallow was triggered. Larger sip of  thin was penetrated into vestibule but exited during the study (PAS 2). There was min-mild vallecular and pyriform sinus residue with thin. Overall, strength and mobility of swallow was adequate. Given her lethargy, decreased cognitive ability and noted wet vocal quality prior to study, recommend she continue nectar thick liquids and puree diet, crush meds with full supervision/assit with meals SLP Visit Diagnosis Dysphagia, oropharyngeal phase (R13.12) Impact on safety and function Moderate aspiration risk     10/23/2022  10:30 AM Treatment Recommendations Treatment Recommendations Therapy as outlined in treatment plan below     10/23/2022  10:30 AM Prognosis Prognosis for Safe Diet Advancement Good Barriers to Reach Goals Cognitive deficits   10/23/2022  10:30 AM Diet Recommendations SLP Diet Recommendations Nectar thick liquid;Dysphagia 1 (Puree) solids Liquid Administration via Cup;Straw Medication Administration Crushed with puree Compensations Minimize environmental distractions;Slow rate;Small sips/bites;Lingual sweep for clearance of pocketing;Monitor for anterior loss Postural Changes Seated upright at 90 degrees     10/23/2022  10:30 AM Other Recommendations Oral Care Recommendations Oral care BID Follow Up Recommendations Skilled nursing-short term rehab (<3 hours/day) Assistance recommended at discharge Frequent or constant Supervision/Assistance Functional Status Assessment Patient has had a recent decline in their functional status and demonstrates the  ability to make significant improvements in function in a reasonable and predictable amount of time.   10/23/2022  10:30 AM Frequency and Duration  Speech Therapy Frequency (ACUTE ONLY) min 2x/week Treatment Duration 2 weeks     10/23/2022  10:30 AM Oral Phase Oral Phase Impaired Oral - Nectar Teaspoon Decreased bolus cohesion;Delayed oral transit;Reduced posterior propulsion Oral - Nectar Cup Delayed oral transit;Decreased bolus cohesion;Reduced posterior propulsion Oral - Thin Cup Decreased bolus cohesion;Lingual/palatal residue;Holding of bolus Oral - Puree Weak lingual manipulation;Reduced posterior propulsion;Delayed oral transit    10/23/2022  10:30 AM Pharyngeal Phase Pharyngeal Phase Impaired Pharyngeal- Nectar Teaspoon Delayed swallow initiation-vallecula Pharyngeal- Nectar Cup Delayed swallow initiation-vallecula;Delayed swallow initiation-pyriform sinuses Pharyngeal- Thin Cup Delayed swallow initiation-pyriform sinuses;Delayed swallow initiation-vallecula;Penetration/Aspiration during swallow;Pharyngeal residue - pyriform;Pharyngeal residue - valleculae Pharyngeal Material enters airway, remains ABOVE vocal cords then ejected out Pharyngeal- Puree Delayed swallow initiation-vallecula    10/23/2022  10:30 AM Cervical Esophageal Phase  Cervical Esophageal Phase WFL [place] Siren 10/23/2022, 10:59 AM                         LOS: 6 days   [facility] Pager on [email or web address]  10/25/2022, 9:15 AM

## 2022-10-26 DIAGNOSIS — G9340 Encephalopathy, unspecified: Secondary | ICD-10-CM | POA: Diagnosis not present

## 2022-10-26 LAB — GLUCOSE, CAPILLARY
Glucose-Capillary: 85 mg/dL (ref 70–99)
Glucose-Capillary: 88 mg/dL (ref 70–99)
Glucose-Capillary: 86 mg/dL (ref 70–99)
Glucose-Capillary: 73 mg/dL (ref 70–99)
Glucose-Capillary: 70 mg/dL (ref 70–99)

## 2022-10-26 LAB — BASIC METABOLIC PANEL
Anion gap: 14 (ref 5–15)
BUN: 39 mg/dL — ABNORMAL HIGH (ref 8–23)
CO2: 26 mmol/L (ref 22–32)
Calcium: 9.4 mg/dL (ref 8.9–10.3)
Chloride: 88 mmol/L — ABNORMAL LOW (ref 98–111)
Creatinine, Ser: 4.2 mg/dL — ABNORMAL HIGH (ref 0.44–1.00)
GFR, Estimated: 11 mL/min — ABNORMAL LOW (ref >60)
Glucose, Bld: 88 mg/dL (ref 70–99)
Potassium: 4.3 mmol/L (ref 3.5–5.1)
Sodium: 128 mmol/L — ABNORMAL LOW (ref 135–145)

## 2022-10-26 LAB — CBC
HCT: 28.9 % — ABNORMAL LOW (ref 36.0–46.0)
Hemoglobin: 9.6 g/dL — ABNORMAL LOW (ref 12.0–15.0)
MCH: 33 pg (ref 26.0–34.0)
MCHC: 33.2 g/dL (ref 30.0–36.0)
MCV: 99.3 fL (ref 80.0–100.0)
Platelets: 274 10*3/uL (ref 150–400)
RBC: 2.91 MIL/uL — ABNORMAL LOW (ref 3.87–5.11)
RDW: 17.6 % — ABNORMAL HIGH (ref 11.5–15.5)
WBC: 9 10*3/uL (ref 4.0–10.5)
nRBC: 0 % (ref 0.0–0.2)

## 2022-10-26 MED ORDER — NEPRO/CARBSTEADY PO LIQD: 237.0000 mL | Freq: Two times a day (BID) | ORAL | 0 refills | Status: DC

## 2022-10-26 MED ORDER — CINACALCET HCL 30 MG PO TABS: 30.0000 mg | ORAL_TABLET | ORAL | Status: DC

## 2022-10-26 MED ORDER — ONDANSETRON HCL 4 MG/2ML IJ SOLN
INTRAMUSCULAR | Status: AC
  Filled 2022-10-26: qty 2

## 2022-10-26 MED ORDER — NEPRO/CARBSTEADY PO LIQD
237.0000 mL | Freq: Two times a day (BID) | ORAL | Status: DC
  Administered 2022-10-26 – 2022-10-27 (×2): 237 mL via ORAL

## 2022-10-26 MED ORDER — THIAMINE HCL 100 MG PO TABS: 100.0000 mg | ORAL_TABLET | Freq: Every day | ORAL | Status: DC

## 2022-10-26 MED ORDER — ONDANSETRON HCL 4 MG/2ML IJ SOLN
4.0000 mg | Freq: Once | INTRAMUSCULAR | Status: AC
  Administered 2022-10-26: 4 mg via INTRAVENOUS

## 2022-10-26 MED ORDER — GUAIFENESIN-DM 100-10 MG/5ML PO SYRP
5.0000 mL | ORAL_SOLUTION | ORAL | Status: DC | PRN
  Administered 2022-10-26: 5 mL via ORAL
  Filled 2022-10-26: qty 5

## 2022-10-26 NOTE — Discharge Summary (Addendum)
[facility]  Physician Discharge Summary   Patient ID: MARGRETTE WYNIA MRN: [id number] DOB/AGE: 1951-07-15 71 y.o.  Admit date: 10/19/2022 Discharge date:   10/26/2022   PCP: [name], MD  DISCHARGE DIAGNOSES:    Acute encephalopathy   Hypotension   ESRD on dialysis [facility])   H/O: CVA (cerebrovascular accident)   Seizure ([place])   Pressure injury of skin   RECOMMENDATIONS FOR OUTPATIENT FOLLOW UP: Speech therapy to continue to follow the patient at SNF for swallow evaluation Check TSH and free T4 in 4 to 6 weeks. Palliative care to follow the patient at skilled nursing facility.   Home Health: To SNF Equipment/Devices: None  CODE STATUS: DNR  DISCHARGE CONDITION: fair  Diet recommendation: Dysphagia 2 diet with nectar thick liquids  INITIAL HISTORY: 71 y.o. 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 Patient resides at nursing home, and this morning staff noticed patient became confused unresponsive with slurred speech and right-sided gaze, and called EMS. In the ED, patient developed an episode of foaming mouth and drooling and unable to talk, lasted about 1 minute associated with hypoxia and tachycardia.  Also had some reported twitching like movement of bilateral arms, 1 dose of Ativan push given and patient was also loaded with Keppra.       Consultants: Neurology.  Nephrology.  Palliative care   Procedures: Continuous EEG   HOSPITAL COURSE:   Acute metabolic encephalopathy/Concern for seizure activity CT head did not show any acute findings.  MRI brain did not show any acute findings.  CT angiogram head and neck showed moderate to severe stenosis in the mid to distal left V4 with diminished opacification of the left PICA.   Mild to moderate stenosis in the bilateral cavernous and supraclinoid ICA segments.   Patient was seen by neurology.  Patient was started on Keppra initially but then subsequently discontinued.  Taken off of long-term monitoring. Mental status seems to be stable.  Do not anticipate any further work-up at this time.   Oropharyngeal dysphagia Cleared for pured diet.  Mentation has improved.  Patient will need assistance with meals.  Hypoglycemia likely due to poor oral intake.  Seems to have done a better job with eating yesterday after she was assisted with her meals.  Continue to monitor. Speech therapy to continue to follow patient at SNF.   Hypoglycemia Low glucose levels likely due to poor oral intake.  She was started on D5 infusion with not much improvement in her glucose levels.  Switched over to D10 infusion.  Oral intake is improved.  D10 was discontinued yesterday evening.  Glucose levels are stable.  Encourage oral intake.   Concern for pneumonia on imaging studies CT angiogram head and neck incidentally showed concern for pneumonia.  She might have aspirated.  However she is afebrile.  WBC is normal.  Respiratory effort is normal.  Possibly chemical pneumonitis.  She was not prescribed any antibiotics.  Occasional cough is noted.  Respiratory status is stable for the most part.  Aspiration precautions.  End-stage renal disease on hemodialysis Dialyzed on Monday Wednesday Friday schedule.   Orthostatic hypotension Noted to be on midodrine.     History of coronary artery disease/history of stroke Noted to be on Lipitor, Plavix. Patient does have left-sided deficits from previous stroke.  Abnormal thyroid function test TSH is elevated.  Free T4 was also noted to be elevated. No known history of thyroid disease.  Recommend that these levels be rechecked in 4 to 6 weeks.   Goals Of care Patient is DNR.  Palliative care was consulted.  They should continue to follow the patient at  skilled nursing facility.  Stage II pressure injury left ankle Pressure Injury 10/20/22 Ankle Anterior;Left Stage 2 -  Partial thickness loss of dermis presenting as a shallow open injury with a red, pink wound bed without slough. 1cm x1cm small blood tinged drainage (Active)  10/20/22 2000  Location: Ankle  Location Orientation: Anterior;Left  Staging: Stage 2 -  Partial thickness loss of dermis presenting as a shallow open injury with a red, pink wound bed without slough.  Wound Description (Comments): 1cm x1cm small blood tinged drainage  Present on Admission: Yes   Severe protein calorie malnutrition Nutrition Problem: Severe Malnutrition Etiology: chronic illness (ESRD on HD, CHF)   Patient is stable.  Okay for discharge to SNF if she is able to tolerate being on the chair for hemodialysis.   PERTINENT LABS:  The results of significant diagnostics from this hospitalization (including imaging, microbiology, ancillary and laboratory) are listed below for reference.     Labs:   Basic Metabolic Panel: Recent Labs  Lab 10/21/22 0501 10/23/22 0407 10/25/22 1202 10/26/22 0328  NA 139 134* 131* 128*  K 3.6 3.8 4.1 4.3  CL 98 96* 93* 88*  CO2 '28 25 28 26  '$ GLUCOSE 79 125* 92 88  BUN 30* 22 31* 39*  CREATININE 4.00* 3.68* 3.73* 4.20*  CALCIUM 10.3 9.7 9.1 9.4  PHOS  --   --  3.1  --    Liver Function Tests: Recent Labs  Lab 10/23/22 0407 10/25/22 1202  AST 20  --   ALT 12  --   ALKPHOS 86  --   BILITOT 1.5*  --   PROT 5.5*  --   ALBUMIN 2.4* 2.2*   No results for input(s): "LIPASE", "AMYLASE" in the last 168 hours.  No results for input(s): "AMMONIA" in the last 168 hours.  CBC: Recent Labs  Lab 10/21/22 0501 10/23/22 0407 10/25/22 1202 10/26/22 0328  WBC 7.5 11.0* 9.7 9.0  HGB 9.9* 9.3* 9.5* 9.6*  HCT 30.0* 29.1* 29.1* 28.9*  MCV 99.7 102.5* 99.7 99.3  PLT 182 174 258 274     CBG: Recent Labs  Lab 10/25/22 2353 10/26/22 0407 10/26/22 0800  10/26/22 1122 10/26/22 1604  GLUCAP 114* 85 81 86 73     IMAGING STUDIES DG Swallowing Func-Speech Pathology  Result Date: 10/23/2022 Table formatting from the original result was not included. Objective Swallowing Evaluation: Type of Study: MBS-Modified Barium Swallow Study  Patient Details Name: TASHIRA TORRE MRN: [id number] Date of Birth: 12-23-1950 Today's Date: 10/23/2022 Time: SLP Start Time (ACUTE ONLY): 7591 -SLP Stop Time (ACUTE ONLY): 6384 SLP Time Calculation (min) (ACUTE ONLY): 13 min Past Medical History: Past Medical History: Diagnosis Date  Acute ischemic stroke ([place]) 02/23/2017  Acute on chronic diastolic CHF (congestive heart failure) ([place])   Adenomatous polyp of colon 10/2010, 2006, 2015  Anemia in CKD (chronic kidney disease) 11/07/2012  s/p blood transfusion.   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  Had some early rejection then stable function for years, then had slow decline of function and went back on hemodialysis in 2012.  Gets HD TTS schedule at [facility] still using L forearm AVF.  GERD (gastroesophageal reflux disease)   GI bleed 2017  felt to be ischemic colitis, last colo 2015  Hyperlipidemia   Hypertension   Neuromuscular disorder (HCC)   neuropathy hand and legs  Osteoporosis   Pseudoaneurysm of surgical AV fistula (HCC)   left upper arm Past Surgical History: Past Surgical History: Procedure Laterality Date  ABDOMINAL AORTOGRAM W/LOWER EXTREMITY N/A 03/26/2022  Procedure: ABDOMINAL AORTOGRAM W/LOWER EXTREMITY;  Surgeon: Marty Heck, MD;  Location: [place] [facility];  Service: Cardiovascular;  Laterality: N/A;  Left  AV  FISTULA PLACEMENT    for dialysis  AV FISTULA PLACEMENT Left 11/22/2015  Procedure: ARTERIOVENOUS (AV) FISTULA CREATION-LEFT BRACHIOCEPHALIC;  Surgeon: Serafina Mitchell, MD;  Location: [place];  Service: Vascular;  Laterality: Left;  AV FISTULA PLACEMENT Right 03/15/2020  Procedure: INSERTION OF ARTERIOVENOUS (AV) GORE-TEX GRAFT ARM ( BRACHIAL AXILLARY );  Surgeon: Katha Cabal, MD;  Location: [facility];  Service: Vascular;  Laterality: Right;  AV FISTULA PLACEMENT Right 05/15/2022  Procedure: EXPLORATION  OF  RIGHT ARM ARTERIOVENOUS GORE-TEX GRAFT;  Surgeon: Angelia Mould, MD;  Location: [place];  Service: Vascular;  Laterality: Right;  [place] REMOVAL Right 04/27/2022  Procedure: REVISION OF RIGHT ARTERIOVENOUS GRAFT AND EXCISION OF RIGHT DEGENERATED GRAFT (AVG);  Surgeon: Angelia Mould, MD;  Location: [place];  Service: Vascular;  Laterality: Right;  BACK SURGERY    CERVICAL FUSION    CHOLECYSTECTOMY  12/02/2012  Procedure: LAPAROSCOPIC CHOLECYSTECTOMY WITH INTRAOPERATIVE CHOLANGIOGRAM;  Surgeon: Edward Jolly, MD;  Location: [place];  Service: General;  Laterality: N/A;  EYE SURGERY Bilateral   cataract surgery  EYE SURGERY Left 2019  laser  HEMATOMA EVACUATION Left 12/24/2016  Procedure: EVACUATION HEMATOMA LEFT UPPER ARM;  Surgeon: Waynetta Sandy, MD;  Location: [place];  Service: Vascular;  Laterality: Left;  I & D EXTREMITY Left 12/31/2016  Procedure: IRRIGATION AND DEBRIDEMENT EXTREMITY;  Surgeon: Angelia Mould, MD;  Location: [place];  Service: Vascular;  Laterality: Left;  INSERTION OF DIALYSIS CATHETER Right 12/24/2016  Procedure: INSERTION OF DIALYSIS CATHETER;  Surgeon: Waynetta Sandy, MD;  Location: [place];  Service: Vascular;  Laterality: Right;  INSERTION OF DIALYSIS CATHETER Right 02/04/2017  Procedure: INSERTION OF DIALYSIS CATHETER;  Surgeon: Waynetta Sandy, MD;  Location: [place];  Service: Vascular;  Laterality: Right;  [place] Left 10/23/2016  Procedure: Fistulagram;  Surgeon: Elam Dutch, MD;  Location: [place] [facility];  Service: Cardiovascular;  Laterality: Left;  PERIPHERAL VASCULAR INTERVENTION  03/26/2022  Procedure: PERIPHERAL VASCULAR INTERVENTION;  Surgeon: Marty Heck, MD;  Location: [place] [facility];  Service: Cardiovascular;;  left sfa  PERIPHERAL VASCULAR THROMBECTOMY Right 04/16/2020  Procedure: PERIPHERAL VASCULAR THROMBECTOMY;  Surgeon: Katha Cabal, MD;  Location: [place] [facility];  Service: Cardiovascular;  Laterality: Right;  RESECTION OF ARTERIOVENOUS FISTULA ANEURYSM Left 11/22/2015  Procedure: RESECTION OF LEFT RADIOCEPHALIC FISTULA ANEURYSM ;  Surgeon: Serafina Mitchell, MD;  Location[facility];  Service: Vascular;  Laterality: Left;  REVISON OF ARTERIOVENOUS FISTULA Left 12/22/2016  Procedure: REVISON OF LEFT ARTERIOVENOUS FISTULA;  Surgeon: Waynetta Sandy,  MD;  Location: [place];  Service: Vascular;  Laterality: Left;  REVISON OF ARTERIOVENOUS FISTULA Left 02/04/2017  Procedure: REVISON OF LEFT UPPER ARM ARTERIOVENOUS FISTULA;  Surgeon: Waynetta Sandy, MD;  Location: [place];  Service: Vascular;  Laterality: Left;  UPPER EXTREMITY ANGIOGRAPHY Bilateral 09/19/2019  Procedure: UPPER EXTREMITY ANGIOGRAPHY;  Surgeon: Katha Cabal, MD;  Location: [place] [facility];  Service: Cardiovascular;  Laterality: Bilateral; HPI: Pt is a 71 yo female admitted from SNF with AMS. Per neurology consult note, differential dx may include seizures, toxic metabolic encephalopathy, or dementia related cognitive fluctuation. Work up currently underway, with continuous EEG started and MRI pending. Previous swallow eval in August 2021 recommending regular diet and thin liquids but with concern for esophageal component (eructation, c/o pain in her stomach after eating, multiple swallows). 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  Recommendations may be updated based on patient status, additional functional criteria and insurance authorization. Assessment / Plan / Recommendation   10/23/2022  10:30 AM Clinical Impressions Clinical Impression MBS completed in setting of lethargy, decreased awareness and challenging positiong with pt requiring feeder to hold head in upright position. Noted presence of cervical hardware. Pt demonstrated mild oropharyngeal impairments with transient laryngeal penetration without aspiration observed. Oral phase marked by decreased cohesion and minimally delayed manipulation and transit with lingual residue with thin. She was unable to utilize straw during study and lack of dentition and cognition prohibited assessment of solid texture. Primary impairment of pharyngeal phase was delayed swallow initiation with barium reaching vallecule and pyriform sinuses and remained for range of 8-12 seconds before swallow was triggered. Larger sip of thin was penetrated into vestibule but exited during the study (PAS 2). There was min-mild vallecular and pyriform sinus residue with thin. Overall, strength and mobility of swallow was adequate. Given her lethargy, decreased cognitive ability and noted wet vocal quality prior to study, recommend she continue nectar thick liquids and puree diet, crush meds with full supervision/assit with meals SLP Visit Diagnosis Dysphagia, oropharyngeal phase (R13.12) Impact on safety and function Moderate aspiration risk     10/23/2022  10:30 AM Treatment Recommendations Treatment Recommendations Therapy as outlined in treatment plan below     10/23/2022  10:30 AM Prognosis Prognosis  for Safe Diet Advancement Good Barriers to Reach Goals Cognitive deficits   10/23/2022  10:30 AM Diet Recommendations SLP Diet Recommendations Nectar thick liquid;Dysphagia 1 (Puree) solids Liquid Administration via Cup;Straw Medication Administration Crushed with puree Compensations Minimize environmental distractions;Slow rate;Small sips/bites;Lingual sweep for clearance of pocketing;Monitor for anterior loss Postural Changes Seated upright at 90 degrees     10/23/2022  10:30 AM Other Recommendations Oral Care Recommendations Oral care BID Follow Up Recommendations Skilled nursing-short term rehab (<3 hours/day) Assistance recommended at discharge Frequent or constant Supervision/Assistance Functional Status Assessment Patient has had a recent decline in their functional status and demonstrates the ability to make significant improvements in function in a reasonable and predictable amount of time.   10/23/2022  10:30 AM Frequency and Duration  Speech Therapy Frequency (ACUTE ONLY) min 2x/week Treatment Duration 2 weeks     10/23/2022  10:30 AM Oral Phase Oral Phase Impaired Oral - Nectar Teaspoon Decreased  bolus cohesion;Delayed oral transit;Reduced posterior propulsion Oral - Nectar Cup Delayed oral transit;Decreased bolus cohesion;Reduced posterior propulsion Oral - Thin Cup Decreased bolus cohesion;Lingual/palatal residue;Holding of bolus Oral - Puree Weak lingual manipulation;Reduced posterior propulsion;Delayed oral transit    10/23/2022  10:30 AM Pharyngeal Phase Pharyngeal Phase Impaired Pharyngeal- Nectar Teaspoon Delayed swallow initiation-vallecula Pharyngeal- Nectar Cup Delayed swallow initiation-vallecula;Delayed swallow initiation-pyriform sinuses Pharyngeal- Thin Cup Delayed swallow initiation-pyriform sinuses;Delayed swallow initiation-vallecula;Penetration/Aspiration during swallow;Pharyngeal residue - pyriform;Pharyngeal residue - valleculae Pharyngeal Material enters airway, remains ABOVE vocal  cords then ejected out Pharyngeal- Puree Delayed swallow initiation-vallecula    10/23/2022  10:30 AM Cervical Esophageal Phase  Cervical Esophageal Phase WFL [place] Siren 10/23/2022, 10:59 AM                     CT ANGIO HEAD NECK W WO CM  Result Date: 10/21/2022 CLINICAL DATA:  Stroke suspected EXAM: CT ANGIOGRAPHY HEAD AND NECK TECHNIQUE: Multidetector CT imaging of the head and neck was performed using the standard protocol during bolus administration of intravenous contrast. Multiplanar CT image reconstructions and MIPs were obtained to evaluate the vascular anatomy. Carotid stenosis measurements (when applicable) are obtained utilizing NASCET criteria, using the distal internal carotid diameter as the denominator. RADIATION DOSE REDUCTION: This exam was performed according to the departmental dose-optimization program which includes automated exposure control, adjustment of the mA and/or kV according to patient size and/or use of iterative reconstruction technique. CONTRAST:  30m OMNIPAQUE IOHEXOL 350 MG/ML SOLN COMPARISON:  Prior CTA of the head and neck, correlation is made with CT head 10/19/2022 and MRA head 03/02/2021 FINDINGS: CT HEAD FINDINGS For noncontrast findings, please see 10/19/2022 CT head. CTA NECK FINDINGS Aortic arch: Two-vessel arch with a common origin of the brachiocephalic and left common carotid arteries. Imaged portion shows no evidence of aneurysm or dissection. No significant stenosis of the major arch vessel origins. Aortic atherosclerosis. The proximal left subclavian artery is quite tortuous and has a retrotracheal course (series 7, image 275). Right carotid system: No evidence of dissection, occlusion, or hemodynamically significant stenosis (greater than 50%). Atherosclerotic disease in the distal common carotid, at the bifurcation, and in the proximal ICA is not hemodynamically significant. Left carotid system: No evidence of dissection, occlusion, or  hemodynamically significant stenosis (greater than 50%). Atherosclerotic disease in the distal common carotid, at the bifurcation, and in the proximal ICA is not hemodynamically significant. Vertebral arteries: No evidence of dissection, occlusion, or hemodynamically significant stenosis (greater than 50%). Right dominant system. Mild irregularity of the size of the left vertebral artery, without significant stenosis. Skeleton: Widening of the atlantodental interval, which appears chronic. Status post ACDF C3-C4 with evidence of prior fusion C5-C7. 5 mm anterolisthesis of C7 on T1 with severe endplate degenerative changes, unchanged which appears compared to 09/02/2022 chest CT. No acute osseous abnormality. Poor dentition with periapical lucencies about several maxillary teeth. Other neck: No acute finding. Upper chest: Increased tree-in-bud opacities in the right-greater-than-left upper lobe compared to 09/02/2022. Possible fluid in the left fissure and interlobular septal thickening. Review of the MIP images confirms the above findings CTA HEAD FINDINGS Anterior circulation: Both internal carotid arteries are patent to the termini, with mild-to-moderate stenosis in the bilateral cavernous and supraclinoid ICA segments. A1 segments patent. Normal anterior communicating artery. Anterior cerebral arteries are patent to their distal aspects. No M1 stenosis or occlusion. MCA branches perfused and symmetric. Posterior circulation: Poor opacification of the left V4, likely reflecting moderate to severe stenosis in the mid  to distal V4. No hemodynamically significant stenosis in the right vertebral artery. The right vertebral artery is dominant and patent to the vertebrobasilar junction. Posterior inferior cerebellar arteries patent proximally, although there is diminished opacification left PICA. Basilar patent to its distal aspect. Superior cerebellar arteries patent proximally. Patent P1 segments. PCAs perfused to  their distal aspects without stenosis. The right posterior communicating artery is diminutive but patent. Venous sinuses: Not well opacified. Anatomic variants: None significant. Review of the MIP images confirms the above findings IMPRESSION: 1. 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 2. Mild-to-moderate stenosis in the bilateral cavernous and supraclinoid ICA segments. 3. No hemodynamically significant stenosis in the neck. 4. Increased tree-in-bud opacities in the right-greater-than-left upper lobes, concerning for infection or aspiration. 5. Aortic atherosclerosis. Aortic Atherosclerosis (ICD10-I70.0). Electronically Signed   By: Merilyn Baba M.D.   On: 10/21/2022 23:10   MR BRAIN WO CONTRAST  Result Date: 10/21/2022 CLINICAL DATA:  Altered mental status EXAM: MRI HEAD WITHOUT CONTRAST TECHNIQUE: Multiplanar, multiecho pulse sequences of the brain and surrounding structures were obtained without intravenous contrast. COMPARISON:  02/22/2021 FINDINGS: Evaluation is somewhat limited by motion artifact. Brain: No restricted diffusion with ADC correlate to suggest acute or subacute infarct. There is some T2 shine through in the right parietal and occipital periventricular white matter (series 5, images 76-80 and series 11, images 15-17), which is associated with focal encephalomalacia (series 16, image 37), new from 03/02/2021 and favored to represent now remote interval infarct. No acute hemorrhage, mass, mass effect, or midline shift. Confluence T2 hyperintense signal in the periventricular white matter and pons, likely the sequela of severe chronic small vessel ischemic disease. Advanced cerebral volume loss for age. Remote lacunar infarcts in the pons, bilateral basal ganglia, and thalami. Vascular: Patent arterial flow voids. Skull and upper cervical spine: Normal marrow signal. Status post ACDF C3-C4. Sinuses/Orbits: No acute finding.  Other: Fluid in the right-greater-than-left mastoid air cells. IMPRESSION: No acute intracranial process. No evidence of acute or subacute infarct. Electronically Signed   By: Merilyn Baba M.D.   On: 10/21/2022 22:09   Overnight EEG with video  Result Date: 10/21/2022 Lora Havens, MD     10/21/2022 11:43 AM Patient Name: AKILAH CURETON MRN: [id number] Epilepsy Attending: Lora Havens Referring Physician/Provider: Donnetta Simpers, MD Duration: 10/20/2022 1000 to 10/21/2022 6063  Patient history: 71 year old patient with end-stage renal disease hypertension hyperlipidemia CAD and previous stroke presents with acute onset confusion and hallucinations. EEG to evaluate for seizure  Level of alertness: lethargic, asleep  AEDs during EEG study: LEV  Technical aspects: This EEG study was done with scalp electrodes positioned according to the 10-20 International system of electrode placement. Electrical activity was reviewed with band pass filter of 1-'70Hz'$ , sensitivity of 7 uV/mm, display speed of 61m/sec with a '60Hz'$  notched filter applied as appropriate. EEG data were recorded continuously and digitally stored.  Video monitoring was available and reviewed as appropriate.  Description: During awake state, no clear posterior dominant rhythm was seen. Sleep was characterized by sleep spindles (12-14), maximal frontocentral region. EEG showed continuous generalized 3 to 6 Hz theta-delta slowing.  Generalized periodic discharges with triphasic morphology at '1hz'$  were also noted, more frequent when awake/stimulated.  Hyperventilation and photic stimulation were not performed.    ABNORMALITY - Periodic discharges with triphasic morphology, generalized ( GPDs) - Continuous slow, generalized  IMPRESSION: This study showed generalized periodic discharges with triphasic morphology  which are most likely due to toxic-metabolic causes due to the morphology, frequency and reactivity to stimulation. Additionally there was  moderate to severe diffuse encephalopathy, nonspecific etiology.  No seizures were seen during the study.  Lora Havens   EEG adult  Result Date: 10/20/2022 Lora Havens, MD     10/21/2022 11:33 AM Patient Name: SHEMECA LUKASIK MRN: [id number] Epilepsy Attending: Lora Havens Referring Physician/Provider: Donnetta Simpers, MD Date: 10/20/2022 Duration: 33.05 mins Patient history: 71 year old patient with end-stage renal disease hypertension hyperlipidemia CAD and previous stroke presents with acute onset confusion and hallucinations. EEG to evaluate for seizure Level of alertness: lethargic AEDs during EEG study: None Technical aspects: This EEG study was done with scalp electrodes positioned according to the 10-20 International system of electrode placement. Electrical activity was reviewed with band pass filter of 1-'70Hz'$ , sensitivity of 7 uV/mm, display speed of 23m/sec with a '60Hz'$  notched filter applied as appropriate. EEG data were recorded continuously and digitally stored.  Video monitoring was available and reviewed as appropriate. Description: EEG showed continuous generalized 3 to 6 Hz theta-delta slowing.  Generalized periodic discharges with triphasic morphology at '1hz'$  were also noted.  Hyperventilation and photic stimulation were not performed.   Patient was noted to have left shoulder twitching in the area over the dialysis fistula.  Concomitant EEG before, during and after the event did not show any EEG changes suggest seizure. ABNORMALITY - Periodic discharges with triphasic morphology, generalized ( GPDs) - Continuous slow, generalized IMPRESSION: This study showed generalized periodic discharges with triphasic morphology which can get the ictal-interictal continuum.  However the frequency and morphology is more commonly indicative of toxic-metabolic causes.  Additionally there was moderate to severe diffuse encephalopathy, nonspecific etiology. Patient was noted to have left  shoulder twitching in the area over the dialysis fistula without concomitant EEG change.  This is most likely not an epileptic event.  PLora Havens  DG Chest 1 View  Result Date: 10/20/2022 CLINICAL DATA:  71year old female with CHF. EXAM: CHEST  1 VIEW COMPARISON:  Portable chest 10/19/2022 and earlier. FINDINGS: Portable AP supine view at 0551 hours. Stable cardiomegaly and mediastinal contours. Extensive Calcified aortic atherosclerosis. Other mediastinal contours are within normal limits. Large lung volumes, improved lung base ventilation since yesterday. Regressed pulmonary vascularity. No significant pleural effusion. No pneumothorax or consolidation. Stable visualized osseous structures. Visualized tracheal air column is within normal limits. Paucity of bowel gas in the visible upper abdomen. IMPRESSION: 1. Regressed pulmonary edema, larger lung volumes and improved lung base ventilation. 2. Stable cardiomegaly. Aortic Atherosclerosis (ICD10-I70.0). Electronically Signed   By: HGenevie AnnM.D.   On: 10/20/2022 06:08   DG Chest Portable 1 View  Result Date: 10/19/2022 CLINICAL DATA:  Altered mental status EXAM: PORTABLE CHEST 1 VIEW COMPARISON:  Chest x-ray September 10, 2022 FINDINGS: Unchanged cardiomediastinal contours including cardiomegaly and tortuous thoracic aorta. Calcified atherosclerosis. Bilateral costophrenic angles are excluded from the field of view, limiting evaluation of the lungs. Bilateral mild reticular pulmonary opacities. No large pneumothorax or pleural effusion. No acute osseous abnormality. The visualized upper abdomen is unremarkable. IMPRESSION: 1. Bilateral mild reticular pulmonary opacities, favored to represent mild pulmonary edema. 2. Unchanged cardiomegaly. Electronically Signed   By: MBeryle FlockM.D.   On: 10/19/2022 11:17   CT Head Wo Contrast  Result Date: 10/19/2022 CLINICAL DATA:  Mental status change of unknown cause. Confusion. Slurred speech. Right  gaze. EXAM: CT HEAD WITHOUT CONTRAST TECHNIQUE: Contiguous axial images were  obtained from the base of the skull through the vertex without intravenous contrast. RADIATION DOSE REDUCTION: This exam was performed according to the departmental dose-optimization program which includes automated exposure control, adjustment of the mA and/or kV according to patient size and/or use of iterative reconstruction technique. COMPARISON:  06/22/2022 FINDINGS: Brain: Advanced generalized atrophy. Extensive chronic small-vessel ischemic changes throughout the white matter. No sign of acute infarction, mass lesion, hemorrhage, hydrocephalus or extra-axial collection. No change since the prior examination. Vascular: There is atherosclerotic calcification of the major vessels at the base of the brain. Skull: Negative Sinuses/Orbits: Clear/normal Other: None IMPRESSION: No acute CT finding. Advanced generalized atrophy. Extensive chronic small-vessel ischemic changes throughout the white matter. Electronically Signed   By: Nelson Chimes M.D.   On: 10/19/2022 10:37    DISCHARGE EXAMINATION: Vitals:   10/26/22 0855 10/26/22 1123 10/26/22 1227 10/26/22 1602  BP: (!) 111/52 (!) 90/33 (!) 95/30 (!) 101/52  Pulse: 73 69 69 72  Resp:  19  18  Temp:  98.7 F (37.1 C)  98.7 F (37.1 C)  TempSrc:  Oral  Oral  SpO2:  98%  94%  Weight:       General appearance: Awake alert.  In no distress Resp: Clear to auscultation bilaterally.  Normal effort Cardio: S1-S2 is normal regular.  No S3-S4.  No rubs murmurs or bruit GI: Abdomen is soft.  Nontender nondistended.  Bowel sounds are present normal.  No masses organomegaly    DISPOSITION: SNF  Discharge Instructions     Call MD for:  difficulty breathing, headache or visual disturbances   Complete by: As directed    Call MD for:  extreme fatigue   Complete by: As directed    Call MD for:  persistant dizziness or light-headedness   Complete by: As directed    Call MD for:   persistant nausea and vomiting   Complete by: As directed    Call MD for:  severe uncontrolled pain   Complete by: As directed    Call MD for:  temperature >100.4   Complete by: As directed    Discharge instructions   Complete by: As directed    Please review instructions on the discharge summary  You were cared for by a hospitalist during your hospital stay. If you have any questions about your discharge medications or the care you received while you were in the hospital after you are discharged, you can call the unit and asked to speak with the hospitalist on call if the hospitalist that took care of you is not available. Once you are discharged, your primary care physician will handle any further medical issues. Please note that NO REFILLS for any discharge medications will be authorized once you are discharged, as it is imperative that you return to your primary care physician (or establish a relationship with a primary care physician if you do not have one) for your aftercare needs so that they can reassess your need for medications and monitor your lab values. If you do not have a primary care physician, you can call [phone] for a physician referral.   Increase activity slowly   Complete by: As directed    No wound care   Complete by: As directed          Allergies as of 10/26/2022       Reactions   Sulfa Antibiotics Other (See Comments)   Both pt's parents allergic to this type of medication   Adhesive [tape] Itching  Medication List     STOP taking these medications    doxycycline 100 MG tablet Commonly known as: VIBRA-TABS   loperamide 2 MG capsule Commonly known as: IMODIUM   UNABLE TO FIND       TAKE these medications    albuterol 108 (90 Base) MCG/ACT inhaler Commonly known as: VENTOLIN HFA Inhale 1-2 puffs into the lungs every 6 (six) hours as needed for wheezing or shortness of breath. What changed: how much to take   albuterol (2.5 MG/3ML)  0.083% nebulizer solution Commonly known as: PROVENTIL Take 3 mLs (2.5 mg total) by nebulization every 4 (four) hours as needed for wheezing. What changed: reasons to take this   atorvastatin 80 MG tablet Commonly known as: LIPITOR Take 80 mg by mouth at bedtime.   benzonatate 200 MG capsule Commonly known as: TESSALON Take 1 capsule (200 mg total) by mouth 2 (two) times daily as needed for cough.   cinacalcet 30 MG tablet Commonly known as: SENSIPAR Take 1 tablet (30 mg total) by mouth every Monday, Wednesday, and Friday with hemodialysis.   clopidogrel 75 MG tablet Commonly known as: Plavix Take 1 tablet (75 mg total) by mouth daily.   Dextromethorphan-guaiFENesin 20-400 MG Tabs Take 1 tablet by mouth in the morning and at bedtime.   Dialyvite 800 0.8 MG Wafr Take 1 tablet by mouth every morning.   escitalopram 10 MG tablet Commonly known as: LEXAPRO Take 1 tablet (10 mg total) by mouth every morning. What changed: when to take this   feeding supplement (NEPRO CARB STEADY) Liqd Take 237 mLs by mouth 2 (two) times daily between meals. Start taking on: October 27, 2022   hydrocortisone 2.5 % rectal cream Commonly known as: ANUSOL-HC Apply 1 Application topically 4 (four) times daily as needed for hemorrhoids. What changed: when to take this   hydrOXYzine 10 MG tablet Commonly known as: ATARAX Take 1 tablet (10 mg total) by mouth 3 (three) times daily as needed for itching. What changed: when to take this   midodrine 10 MG tablet Commonly known as: PROAMATINE Take 1 tablet (10 mg total) by mouth 3 (three) times daily with meals.   pantoprazole 40 MG tablet Commonly known as: PROTONIX Take 1 tablet (40 mg total) by mouth daily.   sevelamer carbonate 800 MG tablet Commonly known as: RENVELA Take 1 tablet (800 mg total) by mouth 3 (three) times daily with meals.   thiamine 100 MG tablet Commonly known as: VITAMIN B1 Take 1 tablet (100 mg total) by mouth  daily. Start taking on: October 27, 2022           TOTAL DISCHARGE TIME: 22 minutes  [employer] operational officer on [email or web address]  10/26/2022, 5:43 PM

## 2022-10-26 NOTE — Progress Notes (Signed)
Speech Language Pathology Treatment: Dysphagia  Patient Details Name: [name] MRN: [id number] DOB: 03/20/1951 Today's Date: 10/26/2022 Time: 4492-0100 SLP Time Calculation (min) (ACUTE ONLY): 13 min  Assessment / Plan / Recommendation Clinical Impression  Pt seen for swallow intervention following MBS 11/10. She exhibited improvements in her alertness, conversing with therapist and making requests/needs known. Accepted various consistencies of nectar liquid, puree and upgraded trial of regular texture solid. Coordination of swallow and respiration was adequate without indications of airway intrusion noted. There was frequent eructation which is consistent with previous bedside assessment in August 2023. Although MBS does not diagnose below level of UES, her esophagus was scanned during Friday's MBS without significant findings. Mastication of regular texture was functional and recommend slowly progressing her texture to Dys 2. If she continues to maintain alertness in future sessions, SLP will observe with thin liquids and continue to progress solid texture as safe and appropriate (order written for Dys 2 texture).    HPI HPI: Pt is a 71 yo female admitted from SNF with AMS. Per neurology consult note, differential dx may include seizures, toxic metabolic encephalopathy, or dementia related cognitive fluctuation. Work up currently underway, with continuous EEG started and MRI pending. Previous swallow eval in August 2021 recommending regular diet and thin liquids but with concern for esophageal component (eructation, c/o pain in her stomach after eating, multiple swallows). 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  Recommendations may be updated based on patient status, additional functional criteria and insurance authorization.    Recommendations  Diet recommendations: Dysphagia 2 (fine chop);Nectar-thick liquid Liquids provided via: Straw;Cup Medication Administration: Crushed with puree Supervision: Full supervision/cueing for compensatory strategies;Trained caregiver to feed patient Compensations: Slow rate;Small sips/bites;Minimize environmental distractions Postural Changes and/or Swallow Maneuvers: Seated upright 90 degrees;Upright 30-60 min after meal                Oral Care Recommendations: Oral care BID Follow Up Recommendations: Skilled nursing-short term rehab (<3 hours/day) Assistance recommended at discharge: Frequent or constant Supervision/Assistance SLP Visit Diagnosis: Dysphagia, oropharyngeal phase (R13.12) Plan: Continue with current plan of care           Houston Siren  10/26/2022, 10:25 AM

## 2022-10-26 NOTE — TOC Progression Note (Signed)
Transition of Care [facility]) - Progression Note    Patient Details  Name: [name] MRN: [id number] Date of Birth: 01/14/1951  Transition of Care [facility]) CM/SW [place], [place] Phone Number: 10/26/2022, 10:46 AM  Clinical Narrative:    CSW advised pt is medically ready to discharge back to [facility] today. Pt will receive HD prior to discharge, unsure of time. Facility to make sure her room is ready when discharged. CSW to follow up with family when DC is confirmed. Pt will require PTAR to transport to facility. TOC will continue to follow for DC needs.   Expected Discharge Plan: [place] Barriers to Discharge: Continued Medical Work up  Expected Discharge Plan and Services Expected Discharge Plan: [place] Choice: [place] arrangements for the past 2 months: [place]                                       Social Determinants of Health (SDOH) Interventions    Readmission Risk Interventions    11/11/2021    3:49 PM 08/18/2021    3:01 PM  Readmission Risk Prevention Plan  Transportation Screening Complete Complete  Medication Review (RN Care Manager) Complete Complete  PCP or Specialist appointment within 3-5 days of discharge Complete Complete  HRI or Home Care Consult Complete Complete  SW Recovery Care/Counseling Consult Complete Complete  Palliative Care Screening Not Applicable Not Applicable  Skilled Nursing Facility Complete Complete

## 2022-10-26 NOTE — Progress Notes (Signed)
Nutrition Follow-up  DOCUMENTATION CODES:   Underweight, Severe malnutrition in context of chronic illness  INTERVENTION:  - Continue Dys 2, Nectar thick diet.   - Add Nepro Shake po BID, each supplement provides 425 kcal and 19 grams protein  NUTRITION DIAGNOSIS:   Severe Malnutrition related to chronic illness (ESRD on HD, CHF) as evidenced by percent weight loss, severe muscle depletion, severe fat depletion (12% x 6 months).  GOAL:   Patient will meet greater than or equal to 90% of their needs  MONITOR:   PO intake, Supplement acceptance, Labs  REASON FOR ASSESSMENT:   Consult Assessment of nutrition requirement/status  ASSESSMENT:   Pt with hx of ESRD on HD, hx CVA, CAD, CHF, HTN, HLD, GERD and osteoporosis presented to ED from nursing facility with AMS.  Meds include: rena-vit, thiamine. Labs reviewed: Na low.   Pt seen by SLP this am and diet was advanced to Dys 2. RN reports that the pt ate 100% of her breakfast this am. Per record, pt has eaten 50-100% of her meals over the past three days. RD will add Nepro BID for now to provide pt with extra calories. Pt planning to discharge back to facility today. No plans for [place] at this time. PO intakes have improved since last assessment.   Diet Order:   Diet Order             DIET DYS 2 Room service appropriate? Yes; Fluid consistency: Nectar Thick  Diet effective now                   EDUCATION NEEDS:   Not appropriate for education at this time  Skin:  Skin Assessment: Skin Integrity Issues: (stage 2, left ankle)  Last BM:  10/25/22  Height:   Ht Readings from Last 1 Encounters:  10/13/22 '5\' 2"'$  (1.575 m)    Weight:   Wt Readings from Last 1 Encounters:  10/23/22 43.1 kg    Ideal Body Weight:  50 kg  BMI:  Body mass index is 17.38 kg/m.  Estimated Nutritional Needs:   Kcal:  1500-1700 kcal/d  Protein:  75-90g/d  Fluid:  1L+UOP  [name] Graciela Husbands, RD, LDN, CNSC.

## 2022-10-26 NOTE — Progress Notes (Signed)
Tx paused due to restart cartridge pt stable

## 2022-10-26 NOTE — Progress Notes (Signed)
Patient ID: [name], female   DOB: October 14, 1951, 71 y.o.   MRN: [id number] [name] Progress Note    Subjective:   Pt seen in room. Pleasant.    Objective:   BP (!) 146/34 (BP Location: Left Leg)   Pulse 82   Temp 98.2 F (36.8 C) (Oral)   Resp 14   Wt 43.1 kg   SpO2 98%   BMI 17.38 kg/m   Physical Exam: Gen: Resting today, no distress CVS: Normal rate, no edema Resp: Bilateral chest rise with no creased work of breathing Abd: Soft, scaphoid, bowel sounds normal Ext: Warm and well perfused Hyper pulsatile left upper arm AVG with distended cephalic vein to her chest. Neuro: nonfocal, Ox 2  OP HD: [place] MWF 4h  350/600  44.8kg  2/2 bath  LUA AVG 16g  Hep none - mircera 50 q4, last 11/3, due 11/17 - hectorol 50 ug IV tiw - sensipar 20 mg po tiw   Assessment/ Plan:   1.  Altered mental status: With earlier evidence of seizure like activity for which she underwent neurology evaluation and EEG that showed generalized triphasic morphology discharges consistent with nonspecific moderate to severe diffuse metabolic encephalopathy.  2. ESRD: is on MWF HD schedule. HD today.  3. Anemia: Downtrending hemoglobin and hematocrit noted without any overt losses.  Hemoglobin 9.3 today.  CTM closely 4. CKD-MBD: Earlier hypercalcemia noted for which Hectorol was appropriately discontinued.  Continue Cinacalcet for PTH control and potentially mitigation of hypercalcemia. 5. Nutrition: advancing diet as able 6. Hypotension/ volume: chronic issue, getting '10mg'$  midodrine tid here as at home. 1-2 kg under dry wt, no vol ^ on exam. UF 1.5- 2 L w/ HD.  7. DNR 8. Dispo - has been accepted back at her SNF [place]. Will try to get HD in a chair today.   Kelly Splinter, MD 10/26/2022, 2:55 PM  Recent Labs  Lab 10/23/22 0407 10/25/22 1202 10/26/22 0328  HGB 9.3* 9.5* 9.6*  ALBUMIN 2.4* 2.2*  --   CALCIUM 9.7 9.1 9.4  PHOS  --  3.1  --   CREATININE 3.68* 3.73* 4.20*   K 3.8 4.1 4.3     Inpatient medications:  atorvastatin  80 mg Oral QHS   Chlorhexidine Gluconate Cloth  6 each Topical Q0600   cinacalcet  30 mg Oral Q M,W,F-HD   clopidogrel  75 mg Oral Daily   escitalopram  10 mg Oral q morning   feeding supplement (NEPRO CARB STEADY)  237 mL Oral BID BM   heparin  5,000 Units Subcutaneous Q12H   midodrine  10 mg Oral TID WC   multivitamin  1 tablet Oral QHS   mouth rinse  15 mL Mouth Rinse Q2H   pantoprazole  40 mg Oral Daily   sevelamer carbonate  800 mg Oral TID WC   thiamine (VITAMIN B1) injection  100 mg Intravenous Daily   Or   thiamine  100 mg Oral Daily     albuterol, guaiFENesin-dextromethorphan, hydrOXYzine, LORazepam, LORazepam, mouth rinse

## 2022-10-27 DIAGNOSIS — G9340 Encephalopathy, unspecified: Secondary | ICD-10-CM | POA: Diagnosis not present

## 2022-10-27 LAB — GLUCOSE, CAPILLARY
Glucose-Capillary: 80 mg/dL (ref 70–99)
Glucose-Capillary: 85 mg/dL (ref 70–99)

## 2022-10-27 NOTE — Plan of Care (Signed)
  Problem: Clinical Measurements: Goal: Respiratory complications will improve Outcome: Progressing Goal: Cardiovascular complication will be avoided Outcome: Progressing   Problem: Activity: Goal: Risk for activity intolerance will decrease Outcome: Progressing   Problem: Coping: Goal: Level of anxiety will decrease Outcome: Progressing   Problem: Elimination: Goal: Will not experience complications related to bowel motility Outcome: Progressing

## 2022-10-27 NOTE — Progress Notes (Signed)
Patient ID: [name], female   DOB: [birth year], 71 y.o.   MRN: [id number] [facility] Progress Note    Subjective:   Pt seen in room. Pt was in the chair for HD yesterday and did well .   Objective:   BP (!) 146/34 (BP Location: Left Leg)   Pulse 82   Temp 98.2 F (36.8 C) (Oral)   Resp 14   Wt 43.1 kg   SpO2 98%   BMI 17.38 kg/m   Physical Exam: Gen: Resting today, no distress CVS: Normal rate, no edema Resp: Bilateral chest rise with no creased work of breathing Abd: Soft, scaphoid, bowel sounds normal Ext: Warm and well perfused Hyper pulsatile left upper arm AVG with distended cephalic vein to her chest. Neuro: nonfocal, Ox 2  OP HD: [place] MWF 4h  350/600  44.8kg  2/2 bath  LUA AVG 16g  Hep none - mircera 50 q4, last 11/3, due 11/17 - hectorol 50 ug IV tiw - sensipar 20 mg po tiw   Assessment/ Plan:   1.  Altered mental status: With earlier evidence of seizure like activity for which she underwent neurology evaluation and EEG that showed generalized triphasic morphology discharges consistent with nonspecific moderate to severe diffuse metabolic encephalopathy. MS has improved overall since admission. For dc today to SNF.  2. ESRD: is on MWF HD schedule. Next HD tomorrow as OP.  3. Anemia: Downtrending hemoglobin and hematocrit noted without any overt losses.  Hemoglobin 9.3 today.  CTM closely 4. CKD-MBD: Earlier hypercalcemia noted for which Hectorol was appropriately discontinued.  Continue Cinacalcet for PTH control and potentially mitigation of hypercalcemia. 5. Nutrition: advancing diet as able 6. Hypotension/ volume: chronic issue, getting '10mg'$  midodrine tid here as at home. 1-2 kg under dry wt, no vol ^ on exam.  7. DNR 8. Dispo - has been accepted back at her SNF [facility]. Tolerated HD in the chair yesterday. OK for discharge.   Kelly Splinter, MD 10/27/2022, 12:24 PM  Recent Labs  Lab 10/23/22 0407 10/25/22 1202 10/26/22 0328   HGB 9.3* 9.5* 9.6*  ALBUMIN 2.4* 2.2*  --   CALCIUM 9.7 9.1 9.4  PHOS  --  3.1  --   CREATININE 3.68* 3.73* 4.20*  K 3.8 4.1 4.3     Inpatient medications:  atorvastatin  80 mg Oral QHS   Chlorhexidine Gluconate Cloth  6 each Topical Q0600   cinacalcet  30 mg Oral Q M,W,F-HD   clopidogrel  75 mg Oral Daily   escitalopram  10 mg Oral q morning   feeding supplement (NEPRO CARB STEADY)  237 mL Oral BID BM   heparin  5,000 Units Subcutaneous Q12H   midodrine  10 mg Oral TID WC   multivitamin  1 tablet Oral QHS   mouth rinse  15 mL Mouth Rinse Q2H   pantoprazole  40 mg Oral Daily   sevelamer carbonate  800 mg Oral TID WC   thiamine (VITAMIN B1) injection  100 mg Intravenous Daily   Or   thiamine  100 mg Oral Daily     albuterol, guaiFENesin-dextromethorphan, hydrOXYzine, LORazepam, LORazepam, mouth rinse

## 2022-10-27 NOTE — TOC Transition Note (Signed)
Transition of Care [facility]) - CM/SW Discharge Note   Patient Details  Name: [name] MRN: [id number] Date of Birth: [birth year]  Transition of Care [facility]) CM/SW Contact:  Coralee Pesa, [place] Phone Number: 10/27/2022, 9:53 AM   Clinical Narrative:    Pt to be transported to [facility] via [place]. Nurse to call report to [phone].    Final next level of care: Skilled Nursing Facility Barriers to Discharge: Barriers Resolved   Patient Goals and CMS Choice Patient states their goals for this hospitalization and ongoing recovery are:: Pt is disoriented and unable to participate in goal setting. CMS Medicare.gov Compare Post Acute Care list provided to:: Patient Represenative (must comment) Choice offered to / list presented to : Sibling  Discharge Placement              [facility] Patient to be transferred to facility by: [place] Name of family member notified: [name] Patient and family notified of of transfer: 10/27/22  Discharge Plan and Services     Post Acute Care Choice: [place]                               Social Determinants of Health (SDOH) Interventions     Readmission Risk Interventions    11/11/2021    3:49 PM 08/18/2021    3:01 PM  Readmission Risk Prevention Plan  Transportation Screening Complete Complete  Medication Review (RN Care Manager) Complete Complete  PCP or Specialist appointment within 3-5 days of discharge Complete Complete  HRI or Home Care Consult Complete Complete  SW Recovery Care/Counseling Consult Complete Complete  Palliative Care Screening Not Applicable Not Applicable  Skilled Nursing Facility Complete Complete

## 2022-10-27 NOTE — Discharge Summary (Addendum)
[facility]  Physician Discharge Summary   Patient ID: [name] MRN: [id number] DOB/AGE: 1951-12-10 71 y.o.  Admit date: 10/19/2022 Discharge date:   10/27/2022   PCP: Renford Dills, MD  DISCHARGE DIAGNOSES:    Acute encephalopathy   Hypotension   ESRD on dialysis [facility])   H/O: CVA (cerebrovascular accident)   Seizure (HCC)   Pressure injury of skin   RECOMMENDATIONS FOR OUTPATIENT FOLLOW UP: Speech therapy to continue to follow the patient at SNF for swallow evaluation Check TSH and free T4 in 4 to 6 weeks. Palliative care to follow the patient at skilled nursing facility.   Home Health: To SNF Equipment/Devices: None  CODE STATUS: DNR  DISCHARGE CONDITION: fair  Diet recommendation: Dysphagia 3 diet with thin liquids  INITIAL HISTORY: 71 y.o. 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 Patient resides at nursing home, and this morning staff noticed patient became confused unresponsive with slurred speech and right-sided gaze, and called EMS. In the ED, patient developed an episode of foaming mouth and drooling and unable to talk, lasted about 1 minute associated with hypoxia and tachycardia.  Also had some reported twitching like movement of bilateral arms, 1 dose of Ativan push given and patient was also loaded with Keppra.       Consultants: Neurology.  Nephrology.  Palliative care   Procedures: Continuous EEG   HOSPITAL COURSE:   Acute metabolic encephalopathy/Concern for seizure activity CT head did not show any acute findings.  MRI brain did not show any acute findings.  CT angiogram head and neck showed moderate to severe stenosis in the mid to distal left V4 with diminished opacification of the left PICA.  Mild to  moderate stenosis in the bilateral cavernous and supraclinoid ICA segments.   Patient was seen by neurology.  Patient was started on Keppra initially but then subsequently discontinued.  Taken off of long-term monitoring. Mental status seems to be stable.  Do not anticipate any further work-up at this time.   Oropharyngeal dysphagia Cleared for pured diet.  Mentation has improved.  Patient will need assistance with meals.  Hypoglycemia likely due to poor oral intake.  Seems to have done a better job with eating yesterday after she was assisted with her meals.  Continue to monitor. Speech therapy to continue to follow patient at SNF.   Hypoglycemia Low glucose levels likely due to poor oral intake.  She was started on D5 infusion with not much improvement in her glucose levels.  Switched over to D10 infusion.  Oral intake is improved.  D10 was discontinued yesterday evening.  Glucose levels are stable.  Encourage oral intake.   Concern for pneumonia on imaging studies CT angiogram head and neck incidentally showed concern for pneumonia.  She might have aspirated.  However she is afebrile.  WBC is normal.  Respiratory effort is normal.  Possibly chemical pneumonitis.  She was not prescribed any antibiotics.  Occasional cough is noted.  Respiratory status is stable for the most part.  Aspiration precautions.  End-stage renal disease on hemodialysis Dialyzed on Monday Wednesday Friday schedule.   Orthostatic hypotension Noted to be on midodrine.     History of coronary artery disease/history of stroke Noted to be on Lipitor, Plavix. Patient does have left-sided deficits from previous stroke.   Abnormal  thyroid function test TSH is elevated.  Free T4 was also noted to be elevated. No known history of thyroid disease.  Recommend that these levels be rechecked in 4 to 6 weeks.   Goals Of care Patient is DNR.  Palliative care was consulted.  They should continue to follow the patient at skilled  nursing facility.  Stage II pressure injury left ankle Pressure Injury 10/20/22 Ankle Anterior;Left Stage 2 -  Partial thickness loss of dermis presenting as a shallow open injury with a red, pink wound bed without slough. 1cm x1cm small blood tinged drainage (Active)  10/20/22 2000  Location: Ankle  Location Orientation: Anterior;Left  Staging: Stage 2 -  Partial thickness loss of dermis presenting as a shallow open injury with a red, pink wound bed without slough.  Wound Description (Comments): 1cm x1cm small blood tinged drainage  Present on Admission: Yes   Severe protein calorie malnutrition Nutrition Problem: Severe Malnutrition Etiology: chronic illness (ESRD on HD, CHF)   Patient is stable.  Okay for discharge to SNF if she is able to tolerate being on the chair for hemodialysis.   PERTINENT LABS:  The results of significant diagnostics from this hospitalization (including imaging, microbiology, ancillary and laboratory) are listed below for reference.     Labs:   Basic Metabolic Panel: Recent Labs  Lab 10/21/22 0501 10/23/22 0407 10/25/22 1202 10/26/22 0328  NA 139 134* 131* 128*  K 3.6 3.8 4.1 4.3  CL 98 96* 93* 88*  CO2 28 25 28 26   GLUCOSE 79 125* 92 88  BUN 30* 22 31* 39*  CREATININE 4.00* 3.68* 3.73* 4.20*  CALCIUM 10.3 9.7 9.1 9.4  PHOS  --   --  3.1  --    Liver Function Tests: Recent Labs  Lab 10/23/22 0407 10/25/22 1202  AST 20  --   ALT 12  --   ALKPHOS 86  --   BILITOT 1.5*  --   PROT 5.5*  --   ALBUMIN 2.4* 2.2*   CBC: Recent Labs  Lab 10/21/22 0501 10/23/22 0407 10/25/22 1202 10/26/22 0328  WBC 7.5 11.0* 9.7 9.0  HGB 9.9* 9.3* 9.5* 9.6*  HCT 30.0* 29.1* 29.1* 28.9*  MCV 99.7 102.5* 99.7 99.3  PLT 182 174 258 274     CBG: Recent Labs  Lab 10/26/22 1122 10/26/22 1604 10/26/22 2310 10/27/22 0331 10/27/22 0804  GLUCAP 86 73 70 80 85     IMAGING STUDIES DG Swallowing Func-Speech Pathology  Result Date:  10/23/2022 Table formatting from the original result was not included. Objective Swallowing Evaluation: Type of Study: MBS-Modified Barium Swallow Study  Patient Details Name: [name] MRN: [id number] Date of Birth: Sep 23, 1951 Today's Date: 10/23/2022 Time: SLP Start Time (ACUTE ONLY): 4696 -SLP Stop Time (ACUTE ONLY): 1007 SLP Time Calculation (min) (ACUTE ONLY): 13 min Past Medical History: Past Medical History: Diagnosis Date  Acute ischemic stroke (HCC) 02/23/2017  Acute on chronic diastolic CHF (congestive heart failure) (HCC)   Adenomatous polyp of colon 10/2010, 2006, 2015  Anemia in CKD (chronic kidney disease) 11/07/2012  s/p blood transfusion.   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  Had some early rejection then stable  function for years, then had slow decline of function and went back on hemodialysis in 2012.  Gets HD TTS schedule at [facility] still using L forearm AVF.  GERD (gastroesophageal reflux disease)   GI bleed 2017  felt to be ischemic colitis, last colo 2015  Hyperlipidemia   Hypertension   Neuromuscular disorder (HCC)   neuropathy hand and legs  Osteoporosis   Pseudoaneurysm of surgical AV fistula (HCC)   left upper arm Past Surgical History: Past Surgical History: Procedure Laterality Date  ABDOMINAL AORTOGRAM W/LOWER EXTREMITY N/A 03/26/2022  Procedure: ABDOMINAL AORTOGRAM W/LOWER EXTREMITY;  Surgeon: Cephus Shelling, MD;  [facility]: [facility] INVASIVE CV LAB;  Service: Cardiovascular;  Laterality: N/A;  Left  AV FISTULA PLACEMENT    for dialysis  AV FISTULA PLACEMENT Left 11/22/2015  Procedure: ARTERIOVENOUS (AV) FISTULA CREATION-LEFT  BRACHIOCEPHALIC;  Surgeon: Nada Libman, MD;  Location: [facility] OR;  Service: Vascular;  Laterality: Left;  AV FISTULA PLACEMENT Right 03/15/2020  Procedure: INSERTION OF ARTERIOVENOUS (AV) GORE-TEX GRAFT ARM ( BRACHIAL AXILLARY );  Surgeon: Renford Dills, MD;  Location: [facility] ORS;  Service: Vascular;  Laterality: Right;  AV FISTULA PLACEMENT Right 05/15/2022  Procedure: EXPLORATION  OF  RIGHT ARM ARTERIOVENOUS GORE-TEX GRAFT;  Surgeon: Chuck Hint, MD;  Location: [facility] OR;  Service: Vascular;  Laterality: Right;  AVGG REMOVAL Right 04/27/2022  Procedure: REVISION OF RIGHT ARTERIOVENOUS GRAFT AND EXCISION OF RIGHT DEGENERATED GRAFT (AVG);  Surgeon: Chuck Hint, MD;  Location: [facility] OR;  Service: Vascular;  Laterality: Right;  BACK SURGERY    CERVICAL FUSION    CHOLECYSTECTOMY  12/02/2012  Procedure: LAPAROSCOPIC CHOLECYSTECTOMY WITH INTRAOPERATIVE CHOLANGIOGRAM;  Surgeon: Mariella Saa, MD;  Location: [facility] OR;  Service: General;  Laterality: N/A;  EYE SURGERY Bilateral   cataract surgery  EYE SURGERY Left 2019  laser  HEMATOMA EVACUATION Left 12/24/2016  Procedure: EVACUATION HEMATOMA LEFT UPPER ARM;  Surgeon: Maeola Harman, MD;  Location: [facility] OR;  Service: Vascular;  Laterality: Left;  I & D EXTREMITY Left 12/31/2016  Procedure: IRRIGATION AND DEBRIDEMENT EXTREMITY;  Surgeon: Chuck Hint, MD;  Location: [facility] OR;  Service: Vascular;  Laterality: Left;  INSERTION OF DIALYSIS CATHETER Right 12/24/2016  Procedure: INSERTION OF DIALYSIS CATHETER;  Surgeon: Maeola Harman, MD;  Location: [facility] OR;  Service: Vascular;  Laterality: Right;  INSERTION OF DIALYSIS CATHETER Right 02/04/2017  Procedure: INSERTION OF DIALYSIS CATHETER;  Surgeon: Maeola Harman, MD;  Location: [facility] OR;  Service: Vascular;  Laterality: Right;  KIDNEY TRANSPLANT  1996  PERIPHERAL VASCULAR CATHETERIZATION Left 10/23/2016  Procedure: Fistulagram;  Surgeon: Sherren Kerns, MD;  Location: [facility] INVASIVE CV  LAB;  Service: Cardiovascular;  Laterality: Left;  PERIPHERAL VASCULAR INTERVENTION  03/26/2022  Procedure: PERIPHERAL VASCULAR INTERVENTION;  Surgeon: Cephus Shelling, MD;  Location: [facility];  Service: Cardiovascular;;  left sfa  PERIPHERAL VASCULAR THROMBECTOMY Right 04/16/2020  Procedure: PERIPHERAL VASCULAR THROMBECTOMY;  Surgeon: Renford Dills, MD;  Location: [facility] INVASIVE CV LAB;  Service: Cardiovascular;  Laterality: Right;  RESECTION OF ARTERIOVENOUS FISTULA ANEURYSM Left 11/22/2015  Procedure: RESECTION OF LEFT RADIOCEPHALIC FISTULA ANEURYSM ;  Surgeon: Nada Libman, MD;  Location: [facility] OR;  Service: Vascular;  Laterality: Left;  REVISON OF ARTERIOVENOUS FISTULA Left 12/22/2016  Procedure: REVISON OF LEFT ARTERIOVENOUS FISTULA;  Surgeon: Maeola Harman, MD;  Location: [facility] OR;  Service: Vascular;  Laterality: Left;  REVISON OF ARTERIOVENOUS FISTULA Left 02/04/2017  Procedure: REVISON OF LEFT UPPER  ARM ARTERIOVENOUS FISTULA;  Surgeon: Maeola Harman, MD;  Location: [facility] OR;  Service: Vascular;  Laterality: Left;  UPPER EXTREMITY ANGIOGRAPHY Bilateral 09/19/2019  Procedure: UPPER EXTREMITY ANGIOGRAPHY;  Surgeon: Renford Dills, MD;  Location: [facility] INVASIVE CV LAB;  Service: Cardiovascular;  Laterality: Bilateral; HPI: Pt is a 71 yo female admitted from SNF with AMS. Per neurology consult note, differential dx may include seizures, toxic metabolic encephalopathy, or dementia related cognitive fluctuation. Work up currently underway, with continuous EEG started and MRI pending. Previous swallow eval in August 2021 recommending regular diet and thin liquids but with concern for esophageal component (eructation, c/o pain in her stomach after eating, multiple swallows). 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  Recommendations may be updated based on patient status, additional functional criteria and insurance authorization. Assessment / Plan / Recommendation   10/23/2022  10:30 AM Clinical Impressions Clinical Impression MBS completed in setting of lethargy, decreased awareness and challenging positiong with pt requiring feeder to hold head in upright position. Noted presence of cervical hardware. Pt demonstrated mild oropharyngeal impairments with transient laryngeal penetration without aspiration observed. Oral phase marked by decreased cohesion and minimally delayed manipulation and transit with lingual residue with thin. She was unable to utilize straw during study and lack of dentition and cognition prohibited assessment of solid texture. Primary impairment of pharyngeal phase was delayed swallow initiation with barium reaching vallecule and pyriform sinuses and remained for range of 8-12 seconds before swallow was triggered. Larger sip of thin was penetrated into vestibule but exited during the study (PAS 2). There was min-mild vallecular and pyriform sinus residue with thin. Overall, strength and mobility of swallow was adequate. Given her lethargy, decreased cognitive ability and noted wet vocal quality prior to study, recommend she continue nectar thick liquids and puree diet, crush meds with full supervision/assit with meals SLP Visit Diagnosis Dysphagia, oropharyngeal phase (R13.12) Impact on safety and function Moderate aspiration risk     10/23/2022  10:30 AM Treatment Recommendations Treatment Recommendations Therapy as outlined in treatment plan below     10/23/2022  10:30 AM Prognosis Prognosis for Safe Diet Advancement Good Barriers to Reach Goals Cognitive deficits   10/23/2022  10:30 AM Diet Recommendations SLP Diet  Recommendations Nectar thick liquid;Dysphagia 1 (Puree) solids Liquid Administration via Cup;Straw Medication Administration Crushed with puree Compensations Minimize environmental distractions;Slow rate;Small sips/bites;Lingual sweep for clearance of pocketing;Monitor for anterior loss Postural Changes Seated upright at 90 degrees     10/23/2022  10:30 AM Other Recommendations Oral Care Recommendations Oral care BID Follow Up Recommendations Skilled nursing-short term rehab (<3 hours/day) Assistance recommended at discharge Frequent or constant Supervision/Assistance Functional Status Assessment Patient has had a recent decline in their functional status and demonstrates the ability to make significant improvements in function in a reasonable and predictable amount of time.   10/23/2022  10:30 AM Frequency and Duration  Speech Therapy Frequency (ACUTE ONLY) min 2x/week Treatment Duration 2 weeks     10/23/2022  10:30 AM Oral Phase Oral Phase Impaired Oral - Nectar Teaspoon Decreased bolus cohesion;Delayed oral transit;Reduced posterior propulsion Oral - Nectar Cup Delayed oral transit;Decreased bolus cohesion;Reduced posterior propulsion Oral - Thin Cup Decreased bolus cohesion;Lingual/palatal  residue;Holding of bolus Oral - Puree Weak lingual manipulation;Reduced posterior propulsion;Delayed oral transit    10/23/2022  10:30 AM Pharyngeal Phase Pharyngeal Phase Impaired Pharyngeal- Nectar Teaspoon Delayed swallow initiation-vallecula Pharyngeal- Nectar Cup Delayed swallow initiation-vallecula;Delayed swallow initiation-pyriform sinuses Pharyngeal- Thin Cup Delayed swallow initiation-pyriform sinuses;Delayed swallow initiation-vallecula;Penetration/Aspiration during swallow;Pharyngeal residue - pyriform;Pharyngeal residue - valleculae Pharyngeal Material enters airway, remains ABOVE vocal cords then ejected out Pharyngeal- Puree Delayed swallow initiation-vallecula    10/23/2022  10:30 AM Cervical Esophageal Phase   Cervical Esophageal Phase WFL Royce Macadamia 10/23/2022, 10:59 AM                     CT ANGIO HEAD NECK W WO CM  Result Date: 10/21/2022 CLINICAL DATA:  Stroke suspected EXAM: CT ANGIOGRAPHY HEAD AND NECK TECHNIQUE: Multidetector CT imaging of the head and neck was performed using the standard protocol during bolus administration of intravenous contrast. Multiplanar CT image reconstructions and MIPs were obtained to evaluate the vascular anatomy. Carotid stenosis measurements (when applicable) are obtained utilizing NASCET criteria, using the distal internal carotid diameter as the denominator. RADIATION DOSE REDUCTION: This exam was performed according to the departmental dose-optimization program which includes automated exposure control, adjustment of the mA and/or kV according to patient size and/or use of iterative reconstruction technique. CONTRAST:  75mL OMNIPAQUE IOHEXOL 350 MG/ML SOLN COMPARISON:  Prior CTA of the head and neck, correlation is made with CT head 10/19/2022 and MRA head 03/02/2021 FINDINGS: CT HEAD FINDINGS For noncontrast findings, please see 10/19/2022 CT head. CTA NECK FINDINGS Aortic arch: Two-vessel arch with a common origin of the brachiocephalic and left common carotid arteries. Imaged portion shows no evidence of aneurysm or dissection. No significant stenosis of the major arch vessel origins. Aortic atherosclerosis. The proximal left subclavian artery is quite tortuous and has a retrotracheal course (series 7, image 275). Right carotid system: No evidence of dissection, occlusion, or hemodynamically significant stenosis (greater than 50%). Atherosclerotic disease in the distal common carotid, at the bifurcation, and in the proximal ICA is not hemodynamically significant. Left carotid system: No evidence of dissection, occlusion, or hemodynamically significant stenosis (greater than 50%). Atherosclerotic disease in the distal common carotid, at the bifurcation, and in the  proximal ICA is not hemodynamically significant. Vertebral arteries: No evidence of dissection, occlusion, or hemodynamically significant stenosis (greater than 50%). Right dominant system. Mild irregularity of the size of the left vertebral artery, without significant stenosis. Skeleton: Widening of the atlantodental interval, which appears chronic. Status post ACDF C3-C4 with evidence of prior fusion C5-C7. 5 mm anterolisthesis of C7 on T1 with severe endplate degenerative changes, unchanged which appears compared to 09/02/2022 chest CT. No acute osseous abnormality. Poor dentition with periapical lucencies about several maxillary teeth. Other neck: No acute finding. Upper chest: Increased tree-in-bud opacities in the right-greater-than-left upper lobe compared to 09/02/2022. Possible fluid in the left fissure and interlobular septal thickening. Review of the MIP images confirms the above findings CTA HEAD FINDINGS Anterior circulation: Both internal carotid arteries are patent to the termini, with mild-to-moderate stenosis in the bilateral cavernous and supraclinoid ICA segments. A1 segments patent. Normal anterior communicating artery. Anterior cerebral arteries are patent to their distal aspects. No M1 stenosis or occlusion. MCA branches perfused and symmetric. Posterior circulation: Poor opacification of the left V4, likely reflecting moderate to severe stenosis in the mid to distal V4. No hemodynamically significant stenosis in the right vertebral artery. The right vertebral artery is dominant and patent to the vertebrobasilar junction.  Posterior inferior cerebellar arteries patent proximally, although there is diminished opacification left PICA. Basilar patent to its distal aspect. Superior cerebellar arteries patent proximally. Patent P1 segments. PCAs perfused to their distal aspects without stenosis. The right posterior communicating artery is diminutive but patent. Venous sinuses: Not well opacified.  Anatomic variants: None significant. Review of the MIP images confirms the above findings IMPRESSION: 1. 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 2. Mild-to-moderate stenosis in the bilateral cavernous and supraclinoid ICA segments. 3. No hemodynamically significant stenosis in the neck. 4. Increased tree-in-bud opacities in the right-greater-than-left upper lobes, concerning for infection or aspiration. 5. Aortic atherosclerosis. Aortic Atherosclerosis (ICD10-I70.0). Electronically Signed   By: Wiliam Ke M.D.   On: 10/21/2022 23:10   MR BRAIN WO CONTRAST  Result Date: 10/21/2022 CLINICAL DATA:  Altered mental status EXAM: MRI HEAD WITHOUT CONTRAST TECHNIQUE: Multiplanar, multiecho pulse sequences of the brain and surrounding structures were obtained without intravenous contrast. COMPARISON:  02/22/2021 FINDINGS: Evaluation is somewhat limited by motion artifact. Brain: No restricted diffusion with ADC correlate to suggest acute or subacute infarct. There is some T2 shine through in the right parietal and occipital periventricular white matter (series 5, images 76-80 and series 11, images 15-17), which is associated with focal encephalomalacia (series 16, image 37), new from 03/02/2021 and favored to represent now remote interval infarct. No acute hemorrhage, mass, mass effect, or midline shift. Confluence T2 hyperintense signal in the periventricular white matter and pons, likely the sequela of severe chronic small vessel ischemic disease. Advanced cerebral volume loss for age. Remote lacunar infarcts in the pons, bilateral basal ganglia, and thalami. Vascular: Patent arterial flow voids. Skull and upper cervical spine: Normal marrow signal. Status post ACDF C3-C4. Sinuses/Orbits: No acute finding. Other: Fluid in the right-greater-than-left mastoid air cells. IMPRESSION: No acute intracranial process. No evidence of acute or subacute infarct.  Electronically Signed   By: Wiliam Ke M.D.   On: 10/21/2022 22:09   Overnight EEG with video  Result Date: 10/21/2022 Charlsie Quest, MD     10/21/2022 11:43 AM Patient Name: [name] MRN: [id number] Epilepsy Attending: Charlsie Quest Referring Physician/Provider: Erick Blinks, MD Duration: 10/20/2022 1000 to 10/21/2022 4098  Patient history: 71 year old patient with end-stage renal disease hypertension hyperlipidemia CAD and previous stroke presents with acute onset confusion and hallucinations. EEG to evaluate for seizure  Level of alertness: lethargic, asleep  AEDs during EEG study: LEV  Technical aspects: This EEG study was done with scalp electrodes positioned according to the 10-20 International system of electrode placement. Electrical activity was reviewed with band pass filter of 1-70Hz , sensitivity of 7 uV/mm, display speed of 31mm/sec with a 60Hz  notched filter applied as appropriate. EEG data were recorded continuously and digitally stored.  Video monitoring was available and reviewed as appropriate.  Description: During awake state, no clear posterior dominant rhythm was seen. Sleep was characterized by sleep spindles (12-14), maximal frontocentral region. EEG showed continuous generalized 3 to 6 Hz theta-delta slowing.  Generalized periodic discharges with triphasic morphology at 1hz  were also noted, more frequent when awake/stimulated.  Hyperventilation and photic stimulation were not performed.    ABNORMALITY - Periodic discharges with triphasic morphology, generalized ( GPDs) - Continuous slow, generalized  IMPRESSION: This study showed generalized periodic discharges with triphasic morphology which are most likely due to toxic-metabolic causes due to the morphology, frequency and reactivity to stimulation. Additionally there was moderate to severe diffuse  encephalopathy, nonspecific etiology.  No seizures were seen during the study.  Charlsie Quest   EEG adult  Result  Date: 10/20/2022 Charlsie Quest, MD     10/21/2022 11:33 AM Patient Name: [name] MRN: [id number] Epilepsy Attending: Charlsie Quest Referring Physician/Provider: Erick Blinks, MD Date: 10/20/2022 Duration: 33.05 mins Patient history: 71 year old patient with end-stage renal disease hypertension hyperlipidemia CAD and previous stroke presents with acute onset confusion and hallucinations. EEG to evaluate for seizure Level of alertness: lethargic AEDs during EEG study: None Technical aspects: This EEG study was done with scalp electrodes positioned according to the 10-20 International system of electrode placement. Electrical activity was reviewed with band pass filter of 1-70Hz , sensitivity of 7 uV/mm, display speed of 29mm/sec with a 60Hz  notched filter applied as appropriate. EEG data were recorded continuously and digitally stored.  Video monitoring was available and reviewed as appropriate. Description: EEG showed continuous generalized 3 to 6 Hz theta-delta slowing.  Generalized periodic discharges with triphasic morphology at 1hz  were also noted.  Hyperventilation and photic stimulation were not performed.   Patient was noted to have left shoulder twitching in the area over the dialysis fistula.  Concomitant EEG before, during and after the event did not show any EEG changes suggest seizure. ABNORMALITY - Periodic discharges with triphasic morphology, generalized ( GPDs) - Continuous slow, generalized IMPRESSION: This study showed generalized periodic discharges with triphasic morphology which can get the ictal-interictal continuum.  However the frequency and morphology is more commonly indicative of toxic-metabolic causes.  Additionally there was moderate to severe diffuse encephalopathy, nonspecific etiology. Patient was noted to have left shoulder twitching in the area over the dialysis fistula without concomitant EEG change.  This is most likely not an epileptic event.  Charlsie Quest    DG Chest 1 View  Result Date: 10/20/2022 CLINICAL DATA:  71 year old female with CHF. EXAM: CHEST  1 VIEW COMPARISON:  Portable chest 10/19/2022 and earlier. FINDINGS: Portable AP supine view at 0551 hours. Stable cardiomegaly and mediastinal contours. Extensive Calcified aortic atherosclerosis. Other mediastinal contours are within normal limits. Large lung volumes, improved lung base ventilation since yesterday. Regressed pulmonary vascularity. No significant pleural effusion. No pneumothorax or consolidation. Stable visualized osseous structures. Visualized tracheal air column is within normal limits. Paucity of bowel gas in the visible upper abdomen. IMPRESSION: 1. Regressed pulmonary edema, larger lung volumes and improved lung base ventilation. 2. Stable cardiomegaly. Aortic Atherosclerosis (ICD10-I70.0). Electronically Signed   By: Odessa Fleming M.D.   On: 10/20/2022 06:08   DG Chest Portable 1 View  Result Date: 10/19/2022 CLINICAL DATA:  Altered mental status EXAM: PORTABLE CHEST 1 VIEW COMPARISON:  Chest x-ray September 10, 2022 FINDINGS: Unchanged cardiomediastinal contours including cardiomegaly and tortuous thoracic aorta. Calcified atherosclerosis. Bilateral costophrenic angles are excluded from the field of view, limiting evaluation of the lungs. Bilateral mild reticular pulmonary opacities. No large pneumothorax or pleural effusion. No acute osseous abnormality. The visualized upper abdomen is unremarkable. IMPRESSION: 1. Bilateral mild reticular pulmonary opacities, favored to represent mild pulmonary edema. 2. Unchanged cardiomegaly. Electronically Signed   By: Jacob Moores M.D.   On: 10/19/2022 11:17   CT Head Wo Contrast  Result Date: 10/19/2022 CLINICAL DATA:  Mental status change of unknown cause. Confusion. Slurred speech. Right gaze. EXAM: CT HEAD WITHOUT CONTRAST TECHNIQUE: Contiguous axial images were obtained from the base of the skull through the vertex without intravenous  contrast. RADIATION DOSE REDUCTION: This exam was performed according to the departmental  dose-optimization program which includes automated exposure control, adjustment of the mA and/or kV according to patient size and/or use of iterative reconstruction technique. COMPARISON:  06/22/2022 FINDINGS: Brain: Advanced generalized atrophy. Extensive chronic small-vessel ischemic changes throughout the white matter. No sign of acute infarction, mass lesion, hemorrhage, hydrocephalus or extra-axial collection. No change since the prior examination. Vascular: There is atherosclerotic calcification of the major vessels at the base of the brain. Skull: Negative Sinuses/Orbits: Clear/normal Other: None IMPRESSION: No acute CT finding. Advanced generalized atrophy. Extensive chronic small-vessel ischemic changes throughout the white matter. Electronically Signed   By: Paulina Fusi M.D.   On: 10/19/2022 10:37    DISCHARGE EXAMINATION: Vitals:   10/26/22 2153 10/26/22 2300 10/27/22 0329 10/27/22 0749  BP: (!) 95/29 (!) 102/46 (!) 105/39 (!) 113/50  Pulse: 82 87 74 66  Resp: 16 15 16 18   Temp:  98.3 F (36.8 C) 98.2 F (36.8 C) 98.2 F (36.8 C)  TempSrc:  Oral Oral   SpO2: 100% (!) 71% 95% 93%  Weight:       General appearance: Awake alert.  In no distress Resp: Clear to auscultation bilaterally.  Normal effort Cardio: S1-S2 is normal regular.  No S3-S4.  No rubs murmurs or bruit GI: Abdomen is soft.  Nontender nondistended.  Bowel sounds are present normal.  No masses organomegaly    DISPOSITION: SNF  Discharge Instructions     Call MD for:  difficulty breathing, headache or visual disturbances   Complete by: As directed    Call MD for:  extreme fatigue   Complete by: As directed    Call MD for:  persistant dizziness or light-headedness   Complete by: As directed    Call MD for:  persistant nausea and vomiting   Complete by: As directed    Call MD for:  severe uncontrolled pain   Complete by: As  directed    Call MD for:  temperature >100.4   Complete by: As directed    Discharge instructions   Complete by: As directed    Please review instructions on the discharge summary  You were cared for by a hospitalist during your hospital stay. If you have any questions about your discharge medications or the care you received while you were in the hospital after you are discharged, you can call the unit and asked to speak with the hospitalist on call if the hospitalist that took care of you is not available. Once you are discharged, your primary care physician will handle any further medical issues. Please note that NO REFILLS for any discharge medications will be authorized once you are discharged, as it is imperative that you return to your primary care physician (or establish a relationship with a primary care physician if you do not have one) for your aftercare needs so that they can reassess your need for medications and monitor your lab values. If you do not have a primary care physician, you can call [phone] for a physician referral.   Increase activity slowly   Complete by: As directed    No wound care   Complete by: As directed          Allergies as of 10/27/2022       Reactions   Sulfa Antibiotics Other (See Comments)   Both pt's parents allergic to this type of medication   Adhesive [tape] Itching        Medication List     STOP taking these medications  doxycycline 100 MG tablet Commonly known as: VIBRA-TABS   loperamide 2 MG capsule Commonly known as: IMODIUM   UNABLE TO FIND       TAKE these medications    albuterol 108 (90 Base) MCG/ACT inhaler Commonly known as: VENTOLIN HFA Inhale 1-2 puffs into the lungs every 6 (six) hours as needed for wheezing or shortness of breath. What changed: how much to take   albuterol (2.5 MG/3ML) 0.083% nebulizer solution Commonly known as: PROVENTIL Take 3 mLs (2.5 mg total) by nebulization every 4 (four) hours  as needed for wheezing. What changed: reasons to take this   atorvastatin 80 MG tablet Commonly known as: LIPITOR Take 80 mg by mouth at bedtime.   benzonatate 200 MG capsule Commonly known as: TESSALON Take 1 capsule (200 mg total) by mouth 2 (two) times daily as needed for cough.   cinacalcet 30 MG tablet Commonly known as: SENSIPAR Take 1 tablet (30 mg total) by mouth every Monday, Wednesday, and Friday with hemodialysis.   clopidogrel 75 MG tablet Commonly known as: Plavix Take 1 tablet (75 mg total) by mouth daily.   Dextromethorphan-guaiFENesin 20-400 MG Tabs Take 1 tablet by mouth in the morning and at bedtime.   Dialyvite 800 0.8 MG Wafr Take 1 tablet by mouth every morning.   escitalopram 10 MG tablet Commonly known as: LEXAPRO Take 1 tablet (10 mg total) by mouth every morning. What changed: when to take this   feeding supplement (NEPRO CARB STEADY) Liqd Take 237 mLs by mouth 2 (two) times daily between meals.   hydrocortisone 2.5 % rectal cream Commonly known as: ANUSOL-HC Apply 1 Application topically 4 (four) times daily as needed for hemorrhoids. What changed: when to take this   hydrOXYzine 10 MG tablet Commonly known as: ATARAX Take 1 tablet (10 mg total) by mouth 3 (three) times daily as needed for itching. What changed: when to take this   midodrine 10 MG tablet Commonly known as: PROAMATINE Take 1 tablet (10 mg total) by mouth 3 (three) times daily with meals.   pantoprazole 40 MG tablet Commonly known as: PROTONIX Take 1 tablet (40 mg total) by mouth daily.   sevelamer carbonate 800 MG tablet Commonly known as: RENVELA Take 1 tablet (800 mg total) by mouth 3 (three) times daily with meals.   thiamine 100 MG tablet Commonly known as: VITAMIN B1 Take 1 tablet (100 mg total) by mouth daily.           TOTAL DISCHARGE TIME: 35 minutes  [name] [employer] on [email or web address]  10/27/2022, 9:40 AM

## 2022-10-27 NOTE — Progress Notes (Signed)
Pt was d/c back to snf today. Contacted [facility] and advised staff of pt's d/c today and that pt will resume care tomorrow.   Melven Sartorius Renal Navigator [phone]

## 2022-10-27 NOTE — Plan of Care (Signed)
  Problem: Education: Goal: Knowledge of General Education information will improve Description: Including pain rating scale, medication(s)/side effects and non-pharmacologic comfort measures Outcome: Adequate for Discharge   Problem: Health Behavior/Discharge Planning: Goal: Ability to manage health-related needs will improve Outcome: Adequate for Discharge   Problem: Clinical Measurements: Goal: Ability to maintain clinical measurements within normal limits will improve Outcome: Adequate for Discharge Goal: Will remain free from infection Outcome: Adequate for Discharge Goal: Diagnostic test results will improve Outcome: Adequate for Discharge Goal: Respiratory complications will improve Outcome: Adequate for Discharge Goal: Cardiovascular complication will be avoided Outcome: Adequate for Discharge   Problem: Activity: Goal: Risk for activity intolerance will decrease Outcome: Adequate for Discharge   Problem: Nutrition: Goal: Adequate nutrition will be maintained Outcome: Adequate for Discharge   Problem: Coping: Goal: Level of anxiety will decrease Outcome: Adequate for Discharge   Problem: Elimination: Goal: Will not experience complications related to bowel motility Outcome: Adequate for Discharge Goal: Will not experience complications related to urinary retention Outcome: Adequate for Discharge   Problem: Pain Managment: Goal: General experience of comfort will improve Outcome: Adequate for Discharge   Problem: Safety: Goal: Ability to remain free from injury will improve Outcome: Adequate for Discharge   Problem: Skin Integrity: Goal: Risk for impaired skin integrity will decrease Outcome: Adequate for Discharge   Problem: Education: Goal: Knowledge of disease or condition will improve Outcome: Adequate for Discharge Goal: Knowledge of secondary prevention will improve (MUST DOCUMENT ALL) Outcome: Adequate for Discharge Goal: Knowledge of patient  specific risk factors will improve [place]ta [place] N/A or DELETE if not current risk factor) Outcome: Adequate for Discharge   Problem: Ischemic Stroke/TIA Tissue Perfusion: Goal: Complications of ischemic stroke/TIA will be minimized Outcome: Adequate for Discharge   Problem: Coping: Goal: Will verbalize positive feelings about self Outcome: Adequate for Discharge Goal: Will identify appropriate support needs Outcome: Adequate for Discharge   Problem: Health Behavior/Discharge Planning: Goal: Ability to manage health-related needs will improve Outcome: Adequate for Discharge Goal: Goals will be collaboratively established with patient/family Outcome: Adequate for Discharge   Problem: Self-Care: Goal: Ability to participate in self-care as condition permits will improve Outcome: Adequate for Discharge Goal: Verbalization of feelings and concerns over difficulty with self-care will improve Outcome: Adequate for Discharge Goal: Ability to communicate needs accurately will improve Outcome: Adequate for Discharge   Problem: Nutrition: Goal: Risk of aspiration will decrease Outcome: Adequate for Discharge Goal: Dietary intake will improve Outcome: Adequate for Discharge

## 2022-10-27 NOTE — Progress Notes (Signed)
Speech Language Pathology Treatment: Dysphagia  Patient Details Name: [name] MRN: [id number] DOB: [birth year] Today's Date: 10/27/2022 Time: 6759-1638 SLP Time Calculation (min) (ACUTE ONLY): 14 min  Assessment / Plan / Recommendation Clinical Impression  Pt seen for ongoing dysphagia management. Nectar thick liquids were recommended after MBS 11/10 primarily due to decreased alertness and cognition. Yesterday and today she is alert, following commands and actively participating throughout session therefore, trials of thin liquids and regular consistency provided which she tolerated well with minimal s/sx of aspiration or dysphagia. There was one instance of delayed cough after thin liquids. Mastication of regular consistency given was functional however she is missing portions of upper and lower dentition therefore, recommend Dys 3 diet, full supervision and upgrade liquids to thin. Medications crushed in puree. Pt is scheduled to transfer to SNF today and recommend follow up at facility.    HPI HPI: Pt is a 71 yo female admitted from SNF with AMS. Per neurology consult note, differential dx may include seizures, toxic metabolic encephalopathy, or dementia related cognitive fluctuation. Work up currently underway, with continuous EEG started and MRI pending. Previous swallow eval in August 2021 recommending regular diet and thin liquids but with concern for esophageal component (eructation, c/o pain in her stomach after eating, multiple swallows). 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  Recommendations may be updated based on patient status,  additional functional criteria and insurance authorization.    Recommendations  Diet recommendations: Thin liquid;Dysphagia 3 (mechanical soft) Liquids provided via: Straw;Cup Medication Administration: Crushed with puree Supervision: Full supervision/cueing for compensatory strategies;Trained caregiver to feed patient Compensations: Slow rate;Small sips/bites;Minimize environmental distractions Postural Changes and/or Swallow Maneuvers: Seated upright 90 degrees;Upright 30-60 min after meal                Oral Care Recommendations: Oral care BID Follow Up Recommendations: Skilled nursing-short term rehab (<3 hours/day) Assistance recommended at discharge: Frequent or constant Supervision/Assistance SLP Visit Diagnosis: Dysphagia, oropharyngeal phase (R13.12) Plan: Continue with current plan of care          [facility] Student SLP   10/27/2022, 11:21 AM

## 2022-10-28 DIAGNOSIS — Z992 Dependence on renal dialysis: Secondary | ICD-10-CM | POA: Diagnosis not present

## 2022-10-28 DIAGNOSIS — T8612 Kidney transplant failure: Secondary | ICD-10-CM | POA: Diagnosis not present

## 2022-10-28 DIAGNOSIS — N186 End stage renal disease: Secondary | ICD-10-CM | POA: Diagnosis not present

## 2022-10-29 DIAGNOSIS — R569 Unspecified convulsions: Secondary | ICD-10-CM | POA: Diagnosis not present

## 2022-10-29 DIAGNOSIS — T827XXD Infection and inflammatory reaction due to other cardiac and vascular devices, implants and grafts, subsequent encounter: Secondary | ICD-10-CM | POA: Diagnosis not present

## 2022-10-29 DIAGNOSIS — E889 Metabolic disorder, unspecified: Secondary | ICD-10-CM | POA: Diagnosis not present

## 2022-10-29 DIAGNOSIS — N186 End stage renal disease: Secondary | ICD-10-CM | POA: Diagnosis not present

## 2022-10-30 DIAGNOSIS — N186 End stage renal disease: Secondary | ICD-10-CM | POA: Diagnosis not present

## 2022-10-30 DIAGNOSIS — Z992 Dependence on renal dialysis: Secondary | ICD-10-CM | POA: Diagnosis not present

## 2022-10-30 DIAGNOSIS — D631 Anemia in chronic kidney disease: Secondary | ICD-10-CM | POA: Diagnosis not present

## 2022-10-30 DIAGNOSIS — N2581 Secondary hyperparathyroidism of renal origin: Secondary | ICD-10-CM | POA: Diagnosis not present

## 2022-11-01 DIAGNOSIS — D631 Anemia in chronic kidney disease: Secondary | ICD-10-CM | POA: Diagnosis not present

## 2022-11-01 DIAGNOSIS — N2581 Secondary hyperparathyroidism of renal origin: Secondary | ICD-10-CM | POA: Diagnosis not present

## 2022-11-01 DIAGNOSIS — Z992 Dependence on renal dialysis: Secondary | ICD-10-CM | POA: Diagnosis not present

## 2022-11-01 DIAGNOSIS — N186 End stage renal disease: Secondary | ICD-10-CM | POA: Diagnosis not present

## 2022-11-02 DIAGNOSIS — K649 Unspecified hemorrhoids: Secondary | ICD-10-CM | POA: Diagnosis not present

## 2022-11-03 DIAGNOSIS — N2581 Secondary hyperparathyroidism of renal origin: Secondary | ICD-10-CM | POA: Diagnosis not present

## 2022-11-03 DIAGNOSIS — Z992 Dependence on renal dialysis: Secondary | ICD-10-CM | POA: Diagnosis not present

## 2022-11-03 DIAGNOSIS — N186 End stage renal disease: Secondary | ICD-10-CM | POA: Diagnosis not present

## 2022-11-06 DIAGNOSIS — N2581 Secondary hyperparathyroidism of renal origin: Secondary | ICD-10-CM | POA: Diagnosis not present

## 2022-11-06 DIAGNOSIS — N186 End stage renal disease: Secondary | ICD-10-CM | POA: Diagnosis not present

## 2022-11-06 DIAGNOSIS — D631 Anemia in chronic kidney disease: Secondary | ICD-10-CM | POA: Diagnosis not present

## 2022-11-06 DIAGNOSIS — Z992 Dependence on renal dialysis: Secondary | ICD-10-CM | POA: Diagnosis not present

## 2022-11-09 DIAGNOSIS — Z992 Dependence on renal dialysis: Secondary | ICD-10-CM | POA: Diagnosis not present

## 2022-11-09 DIAGNOSIS — D631 Anemia in chronic kidney disease: Secondary | ICD-10-CM | POA: Diagnosis not present

## 2022-11-09 DIAGNOSIS — N186 End stage renal disease: Secondary | ICD-10-CM | POA: Diagnosis not present

## 2022-11-09 DIAGNOSIS — N2581 Secondary hyperparathyroidism of renal origin: Secondary | ICD-10-CM | POA: Diagnosis not present

## 2022-11-11 DIAGNOSIS — Z992 Dependence on renal dialysis: Secondary | ICD-10-CM | POA: Diagnosis not present

## 2022-11-11 DIAGNOSIS — N186 End stage renal disease: Secondary | ICD-10-CM | POA: Diagnosis not present

## 2022-11-11 DIAGNOSIS — D631 Anemia in chronic kidney disease: Secondary | ICD-10-CM | POA: Diagnosis not present

## 2022-11-11 DIAGNOSIS — N2581 Secondary hyperparathyroidism of renal origin: Secondary | ICD-10-CM | POA: Diagnosis not present

## 2022-11-16 ENCOUNTER — Other Ambulatory Visit: Payer: Self-pay

## 2022-11-16 ENCOUNTER — Observation Stay (HOSPITAL_COMMUNITY)
Admission: EM | Admit: 2022-11-16 | Discharge: 2022-11-19 | Disposition: A | Discharge status: Skilled Nursing Facility | Payer: Medicare Other | Attending: Internal Medicine | Admitting: Internal Medicine

## 2022-11-16 ENCOUNTER — Observation Stay (HOSPITAL_COMMUNITY): Payer: Medicare Other

## 2022-11-16 ENCOUNTER — Emergency Department (HOSPITAL_COMMUNITY): Payer: Medicare Other

## 2022-11-16 DIAGNOSIS — Z87891 Personal history of nicotine dependence: Secondary | ICD-10-CM | POA: Diagnosis not present

## 2022-11-16 DIAGNOSIS — G9340 Encephalopathy, unspecified: Secondary | ICD-10-CM | POA: Insufficient documentation

## 2022-11-16 DIAGNOSIS — R29810 Facial weakness: Secondary | ICD-10-CM | POA: Diagnosis not present

## 2022-11-16 DIAGNOSIS — R4182 Altered mental status, unspecified: Secondary | ICD-10-CM | POA: Diagnosis present

## 2022-11-16 DIAGNOSIS — Z8673 Personal history of transient ischemic attack (TIA), and cerebral infarction without residual deficits: Secondary | ICD-10-CM | POA: Diagnosis not present

## 2022-11-16 DIAGNOSIS — I482 Chronic atrial fibrillation, unspecified: Secondary | ICD-10-CM | POA: Diagnosis not present

## 2022-11-16 DIAGNOSIS — I480 Paroxysmal atrial fibrillation: Secondary | ICD-10-CM | POA: Insufficient documentation

## 2022-11-16 DIAGNOSIS — Z79899 Other long term (current) drug therapy: Secondary | ICD-10-CM | POA: Insufficient documentation

## 2022-11-16 DIAGNOSIS — Z940 Kidney transplant status: Secondary | ICD-10-CM | POA: Diagnosis not present

## 2022-11-16 DIAGNOSIS — F331 Major depressive disorder, recurrent, moderate: Secondary | ICD-10-CM | POA: Diagnosis present

## 2022-11-16 DIAGNOSIS — I5033 Acute on chronic diastolic (congestive) heart failure: Secondary | ICD-10-CM | POA: Diagnosis not present

## 2022-11-16 DIAGNOSIS — G459 Transient cerebral ischemic attack, unspecified: Secondary | ICD-10-CM

## 2022-11-16 DIAGNOSIS — N186 End stage renal disease: Secondary | ICD-10-CM | POA: Diagnosis not present

## 2022-11-16 DIAGNOSIS — Z992 Dependence on renal dialysis: Secondary | ICD-10-CM | POA: Diagnosis not present

## 2022-11-16 DIAGNOSIS — R410 Disorientation, unspecified: Secondary | ICD-10-CM

## 2022-11-16 DIAGNOSIS — I132 Hypertensive heart and chronic kidney disease with heart failure and with stage 5 chronic kidney disease, or end stage renal disease: Secondary | ICD-10-CM | POA: Insufficient documentation

## 2022-11-16 DIAGNOSIS — I959 Hypotension, unspecified: Secondary | ICD-10-CM | POA: Diagnosis not present

## 2022-11-16 DIAGNOSIS — Z7902 Long term (current) use of antithrombotics/antiplatelets: Secondary | ICD-10-CM | POA: Diagnosis not present

## 2022-11-16 DIAGNOSIS — I6381 Other cerebral infarction due to occlusion or stenosis of small artery: Secondary | ICD-10-CM | POA: Diagnosis not present

## 2022-11-16 DIAGNOSIS — I679 Cerebrovascular disease, unspecified: Secondary | ICD-10-CM | POA: Insufficient documentation

## 2022-11-16 DIAGNOSIS — E43 Unspecified severe protein-calorie malnutrition: Secondary | ICD-10-CM | POA: Diagnosis present

## 2022-11-16 DIAGNOSIS — I639 Cerebral infarction, unspecified: Secondary | ICD-10-CM | POA: Insufficient documentation

## 2022-11-16 DIAGNOSIS — R0689 Other abnormalities of breathing: Secondary | ICD-10-CM | POA: Diagnosis not present

## 2022-11-16 DIAGNOSIS — I2510 Atherosclerotic heart disease of native coronary artery without angina pectoris: Secondary | ICD-10-CM | POA: Insufficient documentation

## 2022-11-16 DIAGNOSIS — I5032 Chronic diastolic (congestive) heart failure: Secondary | ICD-10-CM | POA: Diagnosis present

## 2022-11-16 DIAGNOSIS — R531 Weakness: Secondary | ICD-10-CM | POA: Diagnosis not present

## 2022-11-16 DIAGNOSIS — R2990 Unspecified symptoms and signs involving the nervous system: Secondary | ICD-10-CM | POA: Diagnosis present

## 2022-11-16 DIAGNOSIS — E873 Alkalosis: Secondary | ICD-10-CM | POA: Diagnosis not present

## 2022-11-16 DIAGNOSIS — T80818A Extravasation of other vesicant agent, initial encounter: Secondary | ICD-10-CM | POA: Insufficient documentation

## 2022-11-16 LAB — COMPREHENSIVE METABOLIC PANEL
ALT: 15 U/L (ref 0–44)
AST: 22 U/L (ref 15–41)
Albumin: 3.2 g/dL — ABNORMAL LOW (ref 3.5–5.0)
Alkaline Phosphatase: 91 U/L (ref 38–126)
Anion gap: 13 (ref 5–15)
BUN: 34 mg/dL — ABNORMAL HIGH (ref 8–23)
CO2: 24 mmol/L (ref 22–32)
Calcium: 9.6 mg/dL (ref 8.9–10.3)
Chloride: 100 mmol/L (ref 98–111)
Creatinine, Ser: 4.37 mg/dL — ABNORMAL HIGH (ref 0.44–1.00)
GFR, Estimated: 10 mL/min — ABNORMAL LOW (ref >60)
Glucose, Bld: 120 mg/dL — ABNORMAL HIGH (ref 70–99)
Potassium: 4.5 mmol/L (ref 3.5–5.1)
Sodium: 137 mmol/L (ref 135–145)
Total Bilirubin: 1.4 mg/dL — ABNORMAL HIGH (ref 0.3–1.2)
Total Protein: 6.1 g/dL — ABNORMAL LOW (ref 6.5–8.1)

## 2022-11-16 LAB — CBC
HCT: 31.7 % — ABNORMAL LOW (ref 36.0–46.0)
Hemoglobin: 9.8 g/dL — ABNORMAL LOW (ref 12.0–15.0)
MCH: 32.7 pg (ref 26.0–34.0)
MCHC: 30.9 g/dL (ref 30.0–36.0)
MCV: 105.7 fL — ABNORMAL HIGH (ref 80.0–100.0)
Platelets: 163 10*3/uL (ref 150–400)
RBC: 3 MIL/uL — ABNORMAL LOW (ref 3.87–5.11)
RDW: 18.6 % — ABNORMAL HIGH (ref 11.5–15.5)
WBC: 8.2 10*3/uL (ref 4.0–10.5)
nRBC: 0 % (ref 0.0–0.2)

## 2022-11-16 LAB — CBG MONITORING, ED: Glucose-Capillary: 122 mg/dL — ABNORMAL HIGH (ref 70–99)

## 2022-11-16 LAB — I-STAT CHEM 8, ED
BUN: 35 mg/dL — ABNORMAL HIGH (ref 8–23)
Calcium, Ion: 1.05 mmol/L — ABNORMAL LOW (ref 1.15–1.40)
Chloride: 99 mmol/L (ref 98–111)
Creatinine, Ser: 4.6 mg/dL — ABNORMAL HIGH (ref 0.44–1.00)
Glucose, Bld: 117 mg/dL — ABNORMAL HIGH (ref 70–99)
HCT: 31 % — ABNORMAL LOW (ref 36.0–46.0)
Hemoglobin: 10.5 g/dL — ABNORMAL LOW (ref 12.0–15.0)
Potassium: 4.4 mmol/L (ref 3.5–5.1)
Sodium: 136 mmol/L (ref 135–145)
TCO2: 28 mmol/L (ref 22–32)

## 2022-11-16 LAB — DIFFERENTIAL
Abs Immature Granulocytes: 0.03 10*3/uL (ref 0.00–0.07)
Basophils Absolute: 0 10*3/uL (ref 0.0–0.1)
Basophils Relative: 1 %
Eosinophils Absolute: 0.1 10*3/uL (ref 0.0–0.5)
Eosinophils Relative: 1 %
Immature Granulocytes: 0 %
Lymphocytes Relative: 20 %
Lymphs Abs: 1.7 10*3/uL (ref 0.7–4.0)
Monocytes Absolute: 1 10*3/uL (ref 0.1–1.0)
Monocytes Relative: 12 %
Neutro Abs: 5.3 10*3/uL (ref 1.7–7.7)
Neutrophils Relative %: 66 %

## 2022-11-16 LAB — ETHANOL: Alcohol, Ethyl (B): <10 mg/dL (ref <10)

## 2022-11-16 MED ORDER — ACETAMINOPHEN 160 MG/5ML PO SOLN: 650.0000 mg | ORAL | Status: DC | PRN

## 2022-11-16 MED ORDER — SODIUM CHLORIDE 0.9% FLUSH: 3.0000 mL | Freq: Once | INTRAVENOUS | Status: DC

## 2022-11-16 MED ORDER — THIAMINE MONONITRATE 100 MG PO TABS
100.0000 mg | ORAL_TABLET | Freq: Every day | ORAL | Status: DC
  Administered 2022-11-17 – 2022-11-19 (×3): 100 mg via ORAL
  Filled 2022-11-16 (×3): qty 1

## 2022-11-16 MED ORDER — MIDODRINE HCL 5 MG PO TABS
10.0000 mg | ORAL_TABLET | Freq: Three times a day (TID) | ORAL | Status: DC
  Administered 2022-11-17 – 2022-11-19 (×6): 10 mg via ORAL
  Filled 2022-11-16 (×7): qty 2

## 2022-11-16 MED ORDER — CINACALCET HCL 30 MG PO TABS: 30.0000 mg | ORAL_TABLET | ORAL | Status: DC

## 2022-11-16 MED ORDER — ALBUTEROL SULFATE HFA 108 (90 BASE) MCG/ACT IN AERS: 1.0000 | INHALATION_SPRAY | Freq: Four times a day (QID) | RESPIRATORY_TRACT | Status: DC | PRN

## 2022-11-16 MED ORDER — HEPARIN SODIUM (PORCINE) 5000 UNIT/ML IJ SOLN
5000.0000 [IU] | Freq: Three times a day (TID) | INTRAMUSCULAR | Status: DC
  Administered 2022-11-16 – 2022-11-19 (×7): 5000 [IU] via SUBCUTANEOUS
  Filled 2022-11-16 (×7): qty 1

## 2022-11-16 MED ORDER — NEPRO/CARBSTEADY PO LIQD
237.0000 mL | Freq: Two times a day (BID) | ORAL | Status: DC
  Administered 2022-11-17 – 2022-11-19 (×5): 237 mL via ORAL
  Filled 2022-11-16: qty 237

## 2022-11-16 MED ORDER — CLOPIDOGREL BISULFATE 75 MG PO TABS
75.0000 mg | ORAL_TABLET | Freq: Every day | ORAL | Status: DC
  Administered 2022-11-17 – 2022-11-19 (×3): 75 mg via ORAL
  Filled 2022-11-16 (×3): qty 1

## 2022-11-16 MED ORDER — ATORVASTATIN CALCIUM 80 MG PO TABS
80.0000 mg | ORAL_TABLET | Freq: Every day | ORAL | Status: DC
  Administered 2022-11-17 (×2): 80 mg via ORAL
  Filled 2022-11-16: qty 1
  Filled 2022-11-16: qty 2

## 2022-11-16 MED ORDER — ASPIRIN 81 MG PO CHEW
81.0000 mg | CHEWABLE_TABLET | Freq: Every day | ORAL | Status: DC
  Administered 2022-11-17 – 2022-11-19 (×3): 81 mg via ORAL
  Filled 2022-11-16 (×3): qty 1

## 2022-11-16 MED ORDER — ACETAMINOPHEN 325 MG PO TABS
650.0000 mg | ORAL_TABLET | ORAL | Status: DC | PRN
  Administered 2022-11-17 – 2022-11-18 (×2): 650 mg via ORAL
  Filled 2022-11-16 (×2): qty 2

## 2022-11-16 MED ORDER — STROKE: EARLY STAGES OF RECOVERY BOOK: Freq: Once | Status: DC

## 2022-11-16 MED ORDER — HYDROXYZINE HCL 10 MG PO TABS: 10.0000 mg | ORAL_TABLET | Freq: Three times a day (TID) | ORAL | Status: DC | PRN

## 2022-11-16 MED ORDER — SEVELAMER CARBONATE 800 MG PO TABS
800.0000 mg | ORAL_TABLET | Freq: Three times a day (TID) | ORAL | Status: DC
  Administered 2022-11-17 – 2022-11-19 (×6): 800 mg via ORAL
  Filled 2022-11-16 (×7): qty 1

## 2022-11-16 MED ORDER — ALBUTEROL SULFATE (2.5 MG/3ML) 0.083% IN NEBU: 2.5000 mg | INHALATION_SOLUTION | RESPIRATORY_TRACT | Status: DC | PRN

## 2022-11-16 MED ORDER — SENNOSIDES-DOCUSATE SODIUM 8.6-50 MG PO TABS: 1.0000 | ORAL_TABLET | Freq: Every evening | ORAL | Status: DC | PRN

## 2022-11-16 MED ORDER — ACETAMINOPHEN 650 MG RE SUPP
650.0000 mg | RECTAL | Status: DC | PRN
  Administered 2022-11-17: 650 mg via RECTAL
  Filled 2022-11-16: qty 1

## 2022-11-16 MED ORDER — ESCITALOPRAM OXALATE 10 MG PO TABS
10.0000 mg | ORAL_TABLET | Freq: Every morning | ORAL | Status: DC
  Administered 2022-11-17 – 2022-11-19 (×3): 10 mg via ORAL
  Filled 2022-11-16 (×3): qty 1

## 2022-11-16 MED ORDER — PANTOPRAZOLE SODIUM 40 MG PO TBEC
40.0000 mg | DELAYED_RELEASE_TABLET | Freq: Every day | ORAL | Status: DC
  Administered 2022-11-17 – 2022-11-19 (×3): 40 mg via ORAL
  Filled 2022-11-16 (×3): qty 1

## 2022-11-16 MED ORDER — CLOPIDOGREL BISULFATE 75 MG PO TABS: 75.0000 mg | ORAL_TABLET | Freq: Every day | ORAL | Status: DC

## 2022-11-16 MED ORDER — HYDROCORTISONE (PERIANAL) 2.5 % EX CREA
1.0000 | TOPICAL_CREAM | Freq: Four times a day (QID) | CUTANEOUS | Status: DC | PRN
  Filled 2022-11-16: qty 28.35

## 2022-11-16 NOTE — Consult Note (Addendum)
NEUROLOGY CONSULTATION NOTE   Date of service: November 16, 2022 Patient Name: [name] MRN:  [id number] DOB:  Dec 26, 1950 Reason for consult: stroke code for AMS, leaning to left, ?acute on chronic L sided weakness Requesting physician: Dr. Angus Seller _ _ _   _ __   _ __ _ _  __ __   _ __   __ _  History of Present Illness   [name] is a 71 y.o. 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 We were unable to determine LKW, facility stated she had been acting confused for at least a day and they did not know when she was last at her recent baseline. EMS activated code stroke 2/2 L facial droop and LUE weakness and leaning to the left. Per chart review L weakness has been noted in the past although it is unclear to what degree. Patient does report that she is usually weaker on that side. She was admitted last month with altered mental status and R gaze deviation which was felt to possibly be 2/2 first time seizure but EEG did not show epileptiform discharges so she was not continued on keppra.   On exam she is drowsy, not oriented to age or month, does not know why she is in the hospital, has L facial droop and LUE drift, drift to bed BLE. NIHSS = 9. CTH NAICP, personal review.  TNK not administered 2/2 LKW >24 hrs ago. CTA not performed bc exam with no new focality.  ROS   UTA 2/2 mental status  Past History   I have reviewed the following:  Past Medical History:  Diagnosis Date   Acute ischemic stroke ([place]) 02/23/2017   Acute on chronic diastolic CHF (congestive heart failure) ([place])    Adenomatous polyp of colon 10/2010, 2006, 2015   Anemia in CKD (chronic kidney disease) 11/07/2012   s/p blood transfusion.    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  Had some early rejection then stable function for years, then had slow decline of function and went back on hemodialysis in 2012.  Gets HD TTS schedule at [facility] still using L forearm AVF.   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   Past Surgical History:  Procedure Laterality Date   ABDOMINAL AORTOGRAM W/LOWER EXTREMITY N/A 03/26/2022   Procedure: ABDOMINAL AORTOGRAM W/LOWER EXTREMITY;  Surgeon: Marty Heck, MD;  Location: [place] [facility];  Service: Cardiovascular;  Laterality: N/A;  Left   AV FISTULA PLACEMENT     for dialysis   AV FISTULA PLACEMENT Left 11/22/2015   Procedure: ARTERIOVENOUS (AV) FISTULA CREATION-LEFT BRACHIOCEPHALIC;  Surgeon: Butch Penny  Trula Slade, MD;  Location: [place];  Service: Vascular;  Laterality: Left;   AV FISTULA PLACEMENT Right 03/15/2020   Procedure: INSERTION OF ARTERIOVENOUS (AV) GORE-TEX GRAFT ARM ( BRACHIAL AXILLARY );  Surgeon: Katha Cabal, MD;  Location: [facility];  Service: Vascular;  Laterality: Right;   AV FISTULA PLACEMENT Right 05/15/2022   Procedure: EXPLORATION  OF  RIGHT ARM ARTERIOVENOUS GORE-TEX GRAFT;  Surgeon: Angelia Mould, MD;  Location: [place];  Service: Vascular;  Laterality: Right;   [place] REMOVAL Right 04/27/2022   Procedure: REVISION OF RIGHT ARTERIOVENOUS GRAFT AND EXCISION OF RIGHT DEGENERATED GRAFT (AVG);  Surgeon: Angelia Mould, MD;  Location: [place];  Service: Vascular;  Laterality:  Right;   BACK SURGERY     CERVICAL FUSION     CHOLECYSTECTOMY  12/02/2012   Procedure: LAPAROSCOPIC CHOLECYSTECTOMY WITH INTRAOPERATIVE CHOLANGIOGRAM;  Surgeon: Edward Jolly, MD;  Location: [place];  Service: General;  Laterality: N/A;   EYE SURGERY Bilateral    cataract surgery   EYE SURGERY Left 2019   laser   HEMATOMA EVACUATION Left 12/24/2016   Procedure: EVACUATION HEMATOMA LEFT UPPER ARM;  Surgeon: Waynetta Sandy, MD;  Location: [place];  Service: Vascular;  Laterality: Left;   I & D EXTREMITY Left 12/31/2016   Procedure: IRRIGATION AND DEBRIDEMENT EXTREMITY;  Surgeon: Angelia Mould, MD;  Location: [place];  Service: Vascular;  Laterality: Left;   INSERTION OF DIALYSIS CATHETER Right 12/24/2016   Procedure: INSERTION OF DIALYSIS CATHETER;  Surgeon: Waynetta Sandy, MD;  Location:  [place];  Service: Vascular;  Laterality: Right;   INSERTION OF DIALYSIS CATHETER Right 02/04/2017   Procedure: INSERTION OF DIALYSIS CATHETER;  Surgeon: Waynetta Sandy, MD;  Location: [place];  Service: Vascular;  Laterality: Right;   [place] Left 10/23/2016   Procedure: Fistulagram;  Surgeon: Elam Dutch, MD;  Location: [place] [facility];  Service: Cardiovascular;  Laterality: Left;   PERIPHERAL VASCULAR INTERVENTION  03/26/2022   Procedure: PERIPHERAL VASCULAR INTERVENTION;  Surgeon: Marty Heck, MD;  Location: [place] [facility];  Service: Cardiovascular;;  left sfa   PERIPHERAL VASCULAR THROMBECTOMY Right 04/16/2020   Procedure: PERIPHERAL VASCULAR THROMBECTOMY;  Surgeon: Katha Cabal, MD;  Location: [place] [facility];  Service: Cardiovascular;  Laterality: Right;   RESECTION OF ARTERIOVENOUS FISTULA ANEURYSM Left 11/22/2015   Procedure: RESECTION OF LEFT RADIOCEPHALIC FISTULA ANEURYSM ;  Surgeon: Serafina Mitchell, MD;  Location: [place] [facility];  Service: Vascular;  Laterality: Left;   REVISON OF ARTERIOVENOUS  FISTULA Left 12/22/2016   Procedure: REVISON OF LEFT ARTERIOVENOUS FISTULA;  Surgeon: Waynetta Sandy, MD;  Location: [place];  Service: Vascular;  Laterality: Left;   REVISON OF ARTERIOVENOUS FISTULA Left 02/04/2017   Procedure: REVISON OF LEFT UPPER ARM ARTERIOVENOUS FISTULA;  Surgeon: Waynetta Sandy, MD;  Location: [place];  Service: Vascular;  Laterality: Left;   UPPER EXTREMITY ANGIOGRAPHY Bilateral 09/19/2019   Procedure: UPPER EXTREMITY ANGIOGRAPHY;  Surgeon: Katha Cabal, MD;  Location: [place] [facility];  Service: Cardiovascular;  Laterality: Bilateral;   Family History  Problem Relation Age of Onset   Colon cancer Brother    Cancer Brother    Coronary artery disease Mother 63   Hyperlipidemia Mother    Hypertension Mother    Stroke Maternal Aunt    Esophageal cancer Neg Hx    Stomach cancer Neg Hx    Rectal cancer Neg Hx  Social History   Socioeconomic History   Marital status: Widowed    Spouse name: Not on file   Number of children: 2   Years of education: Not on file   Highest education level: Not on file  Occupational History   Not on file  Tobacco Use   Smoking status: Former    Types: Cigarettes    Quit date: 12/31/1991    Years since quitting: 30.8    Passive exposure: Never   Smokeless tobacco: Never  Vaping Use   Vaping Use: Never used  Substance and Sexual Activity   Alcohol use: No    Alcohol/week: 0.0 standard drinks of alcohol   Drug use: No   Sexual activity: Not Currently    Birth control/protection: Post-menopausal  Other Topics Concern   Not on file  Social History Narrative   Living with her mother    Right handed   Caffeine: only decaf clears ("no brown sodas" or coffee d/t kidney disease)   Low potassium foods d/t kidney disease   Social Determinants of Health   Financial Resource Strain: Not on file  Food Insecurity: Not on file  Transportation Needs: Not on file  Physical Activity: Not on file  Stress: Not  on file  Social Connections: Not on file   Allergies  Allergen Reactions   Sulfa Antibiotics Other (See Comments)    Both pt's parents allergic to this type of medication    Adhesive [Tape] Itching    Medications   (Not in a hospital admission)     Current Facility-Administered Medications:    sodium chloride flush (NS) 0.9 % injection 3 mL, 3 mL, Intravenous, Once, [place], Livingston Diones, MD  Current Outpatient Medications:    albuterol (PROVENTIL) (2.5 MG/3ML) 0.083% nebulizer solution, Take 3 mLs (2.5 mg total) by nebulization every 4 (four) hours as needed for wheezing. (Patient taking differently: Take 2.5 mg by nebulization every 4 (four) hours as needed for wheezing or shortness of breath.), Disp: 75 mL, Rfl: 12   albuterol (VENTOLIN HFA) 108 (90 Base) MCG/ACT inhaler, Inhale 1-2 puffs into the lungs every 6 (six) hours as needed for wheezing or shortness of breath. (Patient taking differently: Inhale 2 puffs into the lungs every 6 (six) hours as needed for wheezing or shortness of breath.), Disp: 1 each, Rfl: 0   atorvastatin (LIPITOR) 80 MG tablet, Take 80 mg by mouth at bedtime., Disp: , Rfl:    B Complex-C-Folic Acid (DIALYVITE 209) 0.8 MG WAFR, Take 1 tablet by mouth every morning., Disp: , Rfl:    benzonatate (TESSALON) 200 MG capsule, Take 1 capsule (200 mg total) by mouth 2 (two) times daily as needed for cough., Disp: 20 capsule, Rfl: 0   cinacalcet (SENSIPAR) 30 MG tablet, Take 1 tablet (30 mg total) by mouth every Monday, Wednesday, and Friday with hemodialysis., Disp: 60 tablet, Rfl:    clopidogrel (PLAVIX) 75 MG tablet, Take 1 tablet (75 mg total) by mouth daily., Disp: 30 tablet, Rfl: 11   Dextromethorphan-guaiFENesin 20-400 MG TABS, Take 1 tablet by mouth in the morning and at bedtime., Disp: , Rfl:    escitalopram (LEXAPRO) 10 MG tablet, Take 1 tablet (10 mg total) by mouth every morning. (Patient taking differently: Take 10 mg by mouth daily.), Disp: 8 tablet, Rfl:  0   hydrocortisone (ANUSOL-HC) 2.5 % rectal cream, Apply 1 Application topically 4 (four) times daily as needed for hemorrhoids. (Patient taking differently: Apply 1 Application topically every 6 (six) hours as needed  for hemorrhoids.), Disp: 30 g, Rfl: 0   hydrOXYzine (ATARAX) 10 MG tablet, Take 1 tablet (10 mg total) by mouth 3 (three) times daily as needed for itching. (Patient taking differently: Take 10 mg by mouth every 8 (eight) hours as needed for itching.), Disp: 30 tablet, Rfl: 0   midodrine (PROAMATINE) 10 MG tablet, Take 1 tablet (10 mg total) by mouth 3 (three) times daily with meals., Disp: , Rfl:    Nutritional Supplements (FEEDING SUPPLEMENT, NEPRO CARB STEADY,) LIQD, Take 237 mLs by mouth 2 (two) times daily between meals., Disp: , Rfl: 0   pantoprazole (PROTONIX) 40 MG tablet, Take 1 tablet (40 mg total) by mouth daily., Disp: 60 tablet, Rfl:    sevelamer carbonate (RENVELA) 800 MG tablet, Take 1 tablet (800 mg total) by mouth 3 (three) times daily with meals., Disp: , Rfl:    thiamine (VITAMIN B1) 100 MG tablet, Take 1 tablet (100 mg total) by mouth daily., Disp: , Rfl:   Vitals   Vitals:   11/16/22 1330 11/16/22 1345 11/16/22 1400 11/16/22 1415  BP: (!) 136/52 105/79 (!) 122/56 (!) 163/68  Pulse: 89 92 88 87  Resp: '17 16 15 19  '$ Temp:      TempSrc:      SpO2: 100% 100% 100% 100%  Weight:         Body mass index is 18.63 kg/m.  Physical Exam   Physical Exam Gen: drowsy, NAD HEENT: Atraumatic, normocephalic;mucous membranes moist; oropharynx clear, tongue without atrophy or fasciculations. Neck: Supple, trachea midline. Resp: CTAB, no w/r/r CV: RRR, no m/g/r; nml S1 and S2. 2+ symmetric peripheral pulses. Abd: soft/NT/ND; nabs x 4 quad Extrem: Nml bulk; no cyanosis, clubbing, or edema.  Neuro: *MS: drowsy, oriented to self and hospital but not age or time. Does not know why she is in the hospital. Variable ability to follow simple commands *Speech: fluid,  nondysarthric, able to name most objects though delayed 2/2 lethargy, able to repeat *CN:    I: Deferred   II,III: PERRLA, blinks to threat bilat, optic discs unable to be visualized 2/2 pupillary constriction   III,IV,VI: EOMI w/o nystagmus, no ptosis   V: Sensation intact from V1 to V3 to LT   VII: Eyelid closure was full.  L UMN facial droop   VIII: Hearing intact to voice   IX,X: Voice normal, palate elevates symmetrically    XI: SCM/trap 5/5 bilat   XII: Tongue protrudes midline, no atrophy or fasciculations  *Motor:   Normal bulk.  No tremor, rigidity or bradykinesia. RUE no drift, LUE drift but not to bed, BLE drift to bed. *Sensory: SILT *Coordination:  UTA *Reflexes:  2+ and symmetric throughout without clonus; toes down-going bilat *Gait: deferred  NIHSS  1a Level of Conscious.: 1 1b LOC Questions: 2 1c LOC Commands: 0 2 Best Gaze: 0 3 Visual: 0 4 Facial Palsy: 1 5a Motor Arm - left: 1 5b Motor Arm - Right: 0 6a Motor Leg - Left: 2 6b Motor Leg - Right: 2 7 Limb Ataxia: 0 8 Sensory: 0 9 Best Language: 0 10 Dysarthria: 0 11 Extinct. and Inatten.: 0  TOTAL: 9   Premorbid mRS = 3   Labs   CBC:  Recent Labs  Lab 11/16/22 1131 11/16/22 1135  WBC 8.2  --   NEUTROABS 5.3  --   HGB 9.8* 10.5*  HCT 31.7* 31.0*  MCV 105.7*  --   PLT 163  --     Basic Metabolic  Panel:  Lab Results  Component Value Date   NA 136 11/16/2022   K 4.4 11/16/2022   CO2 24 11/16/2022   GLUCOSE 117 (H) 11/16/2022   BUN 35 (H) 11/16/2022   CREATININE 4.60 (H) 11/16/2022   CALCIUM 9.6 11/16/2022   GFRNONAA 10 (L) 11/16/2022   GFRAA 7 (L) 07/26/2020   Lipid Panel:  Lab Results  Component Value Date   LDLCALC 117 (H) 02/22/2021   HgbA1c:  Lab Results  Component Value Date   HGBA1C 5.5 09/10/2022   Urine Drug Screen: No results found for: "LABOPIA", "COCAINSCRNUR", "LABBENZ", "AMPHETMU", "THCU", "LABBARB"  Alcohol Level     Component Value Date/Time   ETH <10  11/16/2022 1128     Impression   [name] is a 71 y.o. 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 Patient has some L sided weakness at baseline noted in the chart although it is unclear to what degree.  On exam she is drowsy, not oriented to age or month, does not know why she is in the hospital, has L facial droop and LUE drift, drift to bed BLE. NIHSS = 9. CTH NAICP, personal review. TNK not administered 2/2 LKW >24 hrs ago. CTA not performed bc exam with no clear new focality. She will need stroke workup to clarify if there is an acute component to her L sided weakness and should get repeat EEG as well.   She was admitted last month with altered mental status and R gaze deviation which was felt to possibly be 2/2 first time seizure but EEG did not show epileptiform discharges so she was not continued on keppra.   Recommendations   - Recommend admission to medicine for AMS and stroke workup - AMS workup per primary team - rEEG - Permissive HTN x48 hrs from sx onset or until stroke ruled out by MRI goal BP <220/110. PRN labetalol or hydralazine if BP above these parameters. Avoid oral antihypertensives. - MRI brain wo contrast - CTA or MRA H&N - TTE  - Check A1c and LDL + add statin per guidelines - ASA '81mg'$  daily + plavix '75mg'$  daily x21 days f/b plavix '75mg'$  daily monotherapy after that - q4 hr neuro checks - STAT head CT for any change in neuro exam - Tele - PT/OT/SLP - Stroke education - Amb referral to neurology upon discharge - rEEG - Stroke team will f/u tmrw  Addendum 1844 on 12/4: Unable to obtain IV access for CTA 2/2 bilat fistulas, nephrology confirmed they use the R fistula but largest gauge IV team was able to obtain on L was 22. Will order carotid  US  ______________________________________________________________________   Thank you for the opportunity to take part in the care of this patient. If you have any further questions, please contact the neurology consultation attending.  Signed,  Su Monks, MD [facility] [phone]  If 7pm- 7am, please page neurology on call as listed in [place].  **Any copied and pasted documentation in this note was written by me in another application not billed for and pasted by me into this document.

## 2022-11-16 NOTE — Code Documentation (Signed)
Stroke Response Nurse Documentation Code Documentation  [name] is a 71 y.o. female arriving to [facility]  via [facility] on 11/16/2022 with past medical hx of stroke, ESRD on Dailysis. Code stroke was activated by EMS.   Patient from [facility] where she was noted to drowsy and leaning to the left per transported. When staff was called, they reported that she has been actng abnormal for over a day and they are unaware when she was last known well. EMS was called and when they arrived, they noted left sided weakness and left facial droop. Per MD Quinn Axe, this is her baseline per notes.   Stroke team at the bedside on patient arrival. Patient to CT with team. NIHSS 9, see documentation for details and code stroke times. Patient with decreased LOC, disoriented, left facial droop, left arm weakness, and bilateral leg weakness on exam. The following imaging was completed:  CT Head. Patient is not a candidate for IV Thrombolytic due to unclear LKW. Patient is not a candidate for IR due to exam not consistent with new focal symptoms and unclear LKW.   Care Plan: q2 NIHSS/VS x 12 hours, then q4.   Bedside handoff with ED RN [name].    [name]  Stroke Response RN

## 2022-11-16 NOTE — H&P (Signed)
History and Physical    [name]:[id number] DOB: 1951-10-16 DOA: 11/16/2022  PCP: [name], MD (Confirm with patient/family/NH records and if not entered, this has to be entered at [facility] point of entry) Patient coming from: [name]  I have personally briefly reviewed patient's old medical records in [place]  Chief Complaint: AMS  HPI: [name] is a 71 y.o. 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  This afternoon at dialysis center, staff found patient has a new left-sided facial droop and weakness on her left side and sent her to ED.  Patient appears to be confused on arrival and could not remember what happened.  Patient denies any headache, no abdominal pain or chest pain.  Otherwise patient appears to be confused and unable to answer further questions.  ED Course: Afebrile, no tachycardia or hypotension, nonhypoxic.  CT head negative for acute findings.  BUN 34, creatinine 4.3 appears to be at baseline, bicarb 24.  Review of Systems: Unable to perform, patient appears to be confused.   Past Medical History:  Diagnosis Date   Acute ischemic stroke ([place]) 02/23/2017   Acute on chronic diastolic CHF (congestive heart failure) ([place])    Adenomatous polyp of colon 10/2010, 2006, 2015   Anemia in CKD (chronic kidney disease) 11/07/2012   s/p blood transfusion.    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  Had some early rejection then stable function for years, then had slow decline of function and went back on hemodialysis in 2012.  Gets HD TTS schedule at [facility] still using L forearm AVF.   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    Past Surgical History:  Procedure Laterality Date   ABDOMINAL AORTOGRAM W/LOWER EXTREMITY N/A 03/26/2022   Procedure: ABDOMINAL AORTOGRAM W/LOWER EXTREMITY;  Surgeon: Marty Heck, MD;  Location: [place] [facility];  Service: Cardiovascular;  Laterality: N/A;  Left   AV FISTULA PLACEMENT     for dialysis   AV FISTULA PLACEMENT Left 11/22/2015   Procedure: ARTERIOVENOUS (AV) FISTULA CREATION-LEFT BRACHIOCEPHALIC;  Surgeon: Serafina Mitchell, MD;  Location: [place];  Service: Vascular;  Laterality: Left;   AV FISTULA PLACEMENT Right 03/15/2020   Procedure: INSERTION OF ARTERIOVENOUS (AV) GORE-TEX GRAFT ARM ( BRACHIAL AXILLARY );  Surgeon: Katha Cabal, MD;  Location: [facility];  Service: Vascular;  Laterality: Right;   AV FISTULA PLACEMENT Right 05/15/2022   Procedure: EXPLORATION  OF  RIGHT ARM ARTERIOVENOUS GORE-TEX GRAFT;  Surgeon:  Angelia Mould, MD;  Location: [facility];  Service: Vascular;  Laterality: Right;   [place] REMOVAL Right 04/27/2022   Procedure: REVISION OF RIGHT ARTERIOVENOUS GRAFT AND EXCISION OF RIGHT DEGENERATED GRAFT (AVG);  Surgeon: Angelia Mould, MD;  Location: [place];  Service: Vascular;  Laterality: Right;   BACK SURGERY     CERVICAL FUSION     CHOLECYSTECTOMY  12/02/2012   Procedure: LAPAROSCOPIC  CHOLECYSTECTOMY WITH INTRAOPERATIVE CHOLANGIOGRAM;  Surgeon: Edward Jolly, MD;  Location: [place];  Service: General;  Laterality: N/A;   EYE SURGERY Bilateral    cataract surgery   EYE SURGERY Left 2019   laser   HEMATOMA EVACUATION Left 12/24/2016   Procedure: EVACUATION HEMATOMA LEFT UPPER ARM;  Surgeon: Waynetta Sandy, MD;  Location: [place];  Service: Vascular;  Laterality: Left;   I & D EXTREMITY Left 12/31/2016   Procedure: IRRIGATION AND DEBRIDEMENT EXTREMITY;  Surgeon: Angelia Mould, MD;  Location: [place];  Service: Vascular;  Laterality: Left;   INSERTION OF DIALYSIS CATHETER Right 12/24/2016   Procedure: INSERTION OF DIALYSIS CATHETER;  Surgeon: Waynetta Sandy, MD;  Location: [place];  Service: Vascular;  Laterality: Right;   INSERTION OF DIALYSIS CATHETER Right 02/04/2017   Procedure: INSERTION OF DIALYSIS CATHETER;  Surgeon: Waynetta Sandy, MD;  Location: [place];  Service: Vascular;  Laterality: Right;   [place] Left 10/23/2016   Procedure: Fistulagram;  Surgeon: Elam Dutch, MD;  Location: [place] [facility];  Service: Cardiovascular;  Laterality: Left;   PERIPHERAL VASCULAR INTERVENTION  03/26/2022   Procedure: PERIPHERAL VASCULAR INTERVENTION;  Surgeon: Marty Heck, MD;  Location: [place] [facility];  Service: Cardiovascular;;  left sfa   PERIPHERAL VASCULAR THROMBECTOMY Right 04/16/2020   Procedure: PERIPHERAL VASCULAR THROMBECTOMY;  Surgeon: Katha Cabal, MD;  Location: [place] [facility];  Service: Cardiovascular;  Laterality: Right;   RESECTION OF ARTERIOVENOUS FISTULA ANEURYSM Left 11/22/2015   Procedure: RESECTION OF LEFT RADIOCEPHALIC FISTULA ANEURYSM ;  Surgeon: Serafina Mitchell, MD;  Location: [place] [facility];  Service: Vascular;  Laterality: Left;   REVISON OF ARTERIOVENOUS FISTULA Left 12/22/2016   Procedure: REVISON OF LEFT ARTERIOVENOUS FISTULA;  Surgeon: Waynetta Sandy, MD;  Location: [place];  Service: Vascular;  Laterality: Left;   REVISON OF ARTERIOVENOUS FISTULA Left 02/04/2017   Procedure: REVISON OF LEFT UPPER ARM ARTERIOVENOUS FISTULA;  Surgeon: Waynetta Sandy, MD;  Location: [place];  Service: Vascular;  Laterality: Left;   UPPER EXTREMITY ANGIOGRAPHY Bilateral 09/19/2019   Procedure: UPPER EXTREMITY ANGIOGRAPHY;  Surgeon: Katha Cabal, MD;  Location: [place] [facility];  Service: Cardiovascular;  Laterality: Bilateral;     reports that she quit smoking about 30 years ago. Her smoking use included cigarettes. She has never been exposed to tobacco smoke. She has never used smokeless tobacco. She reports that she does not drink alcohol and does not use drugs.  Allergies  Allergen Reactions   Sulfa Antibiotics Other (See Comments)    Both pt's parents allergic to this type of medication    Adhesive [Tape] Itching    Family History  Problem Relation Age of Onset   Colon cancer Brother    Cancer Brother    Coronary artery disease Mother 18   Hyperlipidemia Mother    Hypertension Mother    Stroke Maternal Aunt    Esophageal cancer Neg Hx    Stomach cancer Neg Hx  Rectal cancer Neg Hx      Prior to Admission medications   Medication Sig Start Date End Date Taking? Authorizing Provider  albuterol (PROVENTIL) (2.5 MG/3ML) 0.083% nebulizer solution Take 3 mLs (2.5 mg total) by nebulization every 4 (four) hours as needed for wheezing. Patient taking differently: Take 2.5 mg by nebulization every 4 (four) hours as needed for wheezing or shortness of breath. 05/04/22   Elgergawy, Silver Huguenin, MD  albuterol (VENTOLIN HFA) 108 (90 Base) MCG/ACT inhaler Inhale 1-2 puffs into the lungs every 6 (six) hours as needed for wheezing or shortness of breath. Patient taking differently: Inhale 2 puffs into the lungs every 6 (six) hours as needed for wheezing or shortness of breath. 01/11/22   Mesner, Corene Cornea, MD  atorvastatin (LIPITOR) 80 MG tablet  Take 80 mg by mouth at bedtime. 03/25/22   [provider]  B Complex-C-Folic Acid (DIALYVITE 814) 0.8 MG WAFR Take 1 tablet by mouth every morning. 01/28/22   [provider]  benzonatate (TESSALON) 200 MG capsule Take 1 capsule (200 mg total) by mouth 2 (two) times daily as needed for cough. 09/15/22   Nita Sells, MD  cinacalcet (SENSIPAR) 30 MG tablet Take 1 tablet (30 mg total) by mouth every Monday, Wednesday, and Friday with hemodialysis. 10/26/22   Bonnielee Haff, MD  clopidogrel (PLAVIX) 75 MG tablet Take 1 tablet (75 mg total) by mouth daily. 03/26/22 03/26/23  Marty Heck, MD  Dextromethorphan-guaiFENesin 20-400 MG TABS Take 1 tablet by mouth in the morning and at bedtime.    [provider]  escitalopram (LEXAPRO) 10 MG tablet Take 1 tablet (10 mg total) by mouth every morning. Patient taking differently: Take 10 mg by mouth daily. 09/15/22   Nita Sells, MD  hydrocortisone (ANUSOL-HC) 2.5 % rectal cream Apply 1 Application topically 4 (four) times daily as needed for hemorrhoids. Patient taking differently: Apply 1 Application topically every 6 (six) hours as needed for hemorrhoids. 09/17/22   Rai, Vernelle Emerald, MD  hydrOXYzine (ATARAX) 10 MG tablet Take 1 tablet (10 mg total) by mouth 3 (three) times daily as needed for itching. Patient taking differently: Take 10 mg by mouth every 8 (eight) hours as needed for itching. 09/15/22   Nita Sells, MD  midodrine (PROAMATINE) 10 MG tablet Take 1 tablet (10 mg total) by mouth 3 (three) times daily with meals. 05/21/22   Mercy Riding, MD  Nutritional Supplements (FEEDING SUPPLEMENT, NEPRO CARB STEADY,) LIQD Take 237 mLs by mouth 2 (two) times daily between meals. 10/27/22   Bonnielee Haff, MD  pantoprazole (PROTONIX) 40 MG tablet Take 1 tablet (40 mg total) by mouth daily. 08/18/21   Nita Sells, MD  sevelamer carbonate (RENVELA) 800 MG tablet Take 1 tablet (800 mg total) by mouth 3  (three) times daily with meals. 09/15/22   Nita Sells, MD  thiamine (VITAMIN B1) 100 MG tablet Take 1 tablet (100 mg total) by mouth daily. 10/27/22   Bonnielee Haff, MD    Physical Exam: Vitals:   11/16/22 1545 11/16/22 1600 11/16/22 1615 11/16/22 1630  BP: (!) 165/62 (!) 179/56 (!) 164/69 (!) 166/74  Pulse: 88 90 84 87  Resp: '15 19 18 18  '$ Temp:      TempSrc:      SpO2: 100% 100% 100% 99%  Weight:        Constitutional: NAD, calm, comfortable Vitals:   11/16/22 1545 11/16/22 1600 11/16/22 1615 11/16/22 1630  BP: (!) 165/62 (!) 179/56 (!) 164/69 (!) 166/74  Pulse: 88 90 84 87  Resp: '15 19 18 18  '$ Temp:      TempSrc:      SpO2: 100% 100% 100% 99%  Weight:       Eyes: PERRL, lids and conjunctivae normal ENMT: Mucous membranes are moist. Posterior pharynx clear of any exudate or lesions.Normal dentition.  Neck: normal, supple, no masses, no thyromegaly Respiratory: clear to auscultation bilaterally, no wheezing, no crackles. Normal respiratory effort. No accessory muscle use.  Cardiovascular: Regular rate and rhythm, no murmurs / rubs / gallops. No extremity edema. 2+ pedal pulses. No carotid bruits.  Abdomen: no tenderness, no masses palpated. No hepatosplenomegaly. Bowel sounds positive.  Musculoskeletal: no clubbing / cyanosis. No joint deformity upper and lower extremities. Good ROM, no contractures. Normal muscle tone.  Skin: no rashes, lesions, ulcers. No induration Neurologic: Facial droop on left side, no tongue deviation, muscle strength slightly weaker on left side compared to right, denies any sensation changes on any of the limbs Psychiatric: Oriented to herself, confused about time and place    Labs on Admission: I have personally reviewed following labs and imaging studies  CBC: Recent Labs  Lab 11/16/22 1131 11/16/22 1135  WBC 8.2  --   NEUTROABS 5.3  --   HGB 9.8* 10.5*  HCT 31.7* 31.0*  MCV 105.7*  --   PLT 163  --    Basic Metabolic  Panel: Recent Labs  Lab 11/16/22 1131 11/16/22 1135  NA 137 136  K 4.5 4.4  CL 100 99  CO2 24  --   GLUCOSE 120* 117*  BUN 34* 35*  CREATININE 4.37* 4.60*  CALCIUM 9.6  --    GFR: Estimated Creatinine Clearance: 8.1 mL/min (A) (by C-G formula based on SCr of 4.6 mg/dL (H)). Liver Function Tests: Recent Labs  Lab 11/16/22 1131  AST 22  ALT 15  ALKPHOS 91  BILITOT 1.4*  PROT 6.1*  ALBUMIN 3.2*   No results for input(s): "LIPASE", "AMYLASE" in the last 168 hours. No results for input(s): "AMMONIA" in the last 168 hours. Coagulation Profile: No results for input(s): "INR", "PROTIME" in the last 168 hours. Cardiac Enzymes: No results for input(s): "CKTOTAL", "CKMB", "CKMBINDEX", "TROPONINI" in the last 168 hours. BNP (last 3 results) No results for input(s): "PROBNP" in the last 8760 hours. HbA1C: No results for input(s): "HGBA1C" in the last 72 hours. CBG: Recent Labs  Lab 11/16/22 1129  GLUCAP 122*   Lipid Profile: No results for input(s): "CHOL", "HDL", "LDLCALC", "TRIG", "CHOLHDL", "LDLDIRECT" in the last 72 hours. Thyroid Function Tests: No results for input(s): "TSH", "T4TOTAL", "FREET4", "T3FREE", "THYROIDAB" in the last 72 hours. Anemia Panel: No results for input(s): "VITAMINB12", "FOLATE", "FERRITIN", "TIBC", "IRON", "RETICCTPCT" in the last 72 hours. Urine analysis:    Component Value Date/Time   COLORURINE YELLOW 01/30/2011 1648   APPEARANCEUR CLOUDY (A) 01/30/2011 1648   LABSPEC 1.016 01/30/2011 1648   PHURINE 5.0 01/30/2011 1648   GLUCOSEU NEGATIVE 10/14/2009 0747   HGBUR SMALL (A) 01/30/2011 1648   BILIRUBINUR NEGATIVE 01/30/2011 1648   KETONESUR NEGATIVE 01/30/2011 1648   PROTEINUR 100 (A) 01/30/2011 1648   UROBILINOGEN 0.2 01/30/2011 1648   NITRITE NEGATIVE 01/30/2011 1648   LEUKOCYTESUR SMALL (A) 01/30/2011 1648    Radiological Exams on Admission: CT HEAD CODE STROKE WO CONTRAST  Result Date: 11/16/2022 CLINICAL DATA:  Code stroke.  Neuro deficit, acute, stroke suspected. EXAM: CT HEAD WITHOUT CONTRAST TECHNIQUE: Contiguous axial images were obtained from the base of the skull  through the vertex without intravenous contrast. RADIATION DOSE REDUCTION: This exam was performed according to the departmental dose-optimization program which includes automated exposure control, adjustment of the mA and/or kV according to patient size and/or use of iterative reconstruction technique. COMPARISON:  Head CT 10/19/2022 and MRI 10/21/2022 FINDINGS: Brain: There is no evidence of an acute cortically based infarct, mass, midline shift, or extra-axial fluid collection. Confluent hypodensities in the cerebral white matter bilaterally are similar to the prior CT and are nonspecific but compatible with severe chronic small vessel ischemic disease. Chronic lacunar infarcts are again noted in the deep gray nuclei and pons. There is moderate cerebral atrophy. Vascular: Calcified atherosclerosis at the skull base. No hyperdense vessel. Skull: No acute fracture or suspicious osseous lesion. Sinuses/Orbits: The visualized paranasal sinuses are clear. Persistent moderate right and small left mastoid effusions. Bilateral cataract extraction. Other: None. ASPECTS ([place] Stroke Program Early CT Score) - Ganglionic level infarction (caudate, lentiform nuclei, internal capsule, insula, M1-M3 cortex): 7 - Supraganglionic infarction (M4-M6 cortex): 3 Total score (0-10 with 10 being normal): 10 These results were communicated to Dr. Quinn Axe at 11:50 am on 11/16/2022 by text page via the [facility] messaging system. IMPRESSION: 1. No evidence of acute intracranial abnormality. 2. Severe chronic small vessel ischemic disease. Electronically Signed   By: Logan Bores M.D.   On: 11/16/2022 11:50    EKG: Independently reviewed.  Sinus, no acute ST changes, borderline prolonged QTc.  Assessment/Plan Principal Problem:   AMS (altered mental status) Active Problems:   ESRD on  dialysis [facility])   Acute encephalopathy  (please populate well all problems here in Problem List. (For example, if patient is on BP meds at home and you resume or decide to hold them, it is a problem that needs to be her. Same for CAD, COPD, HLD and so on)  Stroke rule out -With new onset of left-sided facial droop and left-sided paresis, raised concern about stroke.  She does have a risk factor of PAF not on systemic anticoagulation due to past history of severe GI bleed. -Code stroke called in the ED, but with unknown time window from onset of symptoms, consider not a candidate for TNK.  Nephrology consultation appreciated, agreed with brain MRI.  Rest of the stroke workup as per neurology. -Patient had a similar presentation on last admission about 1 month ago and hospitalized and went to extensive workup including seizure workup, and eventually she was considered to be a low risk of seizure and not started on any maintenance seizure medications.  No seizure-like activity reported either in ED or HD center, will defer whether to be new seizure workup to neurology. -Continue Plavix and statin. -Speech evaluation, PT OT evaluation. -Other Ddx, on last admission, he VBG showed acute decompensated respiratory alkalosis, with unknown etiology, will check VBG  Acute metabolic encephalopathy -Unknown etiology, workup as above. -Other Ddx, no cough, afebrile, WBC within normal limits, low suspicion for sepsis, hold off sepsis workup.  ESRD on HD -Medically.  To be euvolemic and no significant electrolytes imbalance or acidosis, VBG pending, nephrology consulted.  Hx of CVA -Continue Plavix and Statin  HTN with orthostatic hypotension -Blood pressure stable, continue midodrine 3 times daily  Moderate protein calorie malnutrition -BMI= 18.6, continue protein supplements.  Abnormal thyroid study -4 weeks ago, patient had elevated TSH borderline high and elevated T4, will check TSH and T4.  DVT  prophylaxis: Heparin subQ Code Status: DNR Family Communication: None at bedside Disposition Plan: Expect less than 2  midnight hospital stay if stroke workup negative and mentation improves Consults called: Neurology and nephrology Admission status: Tele obs   Lequita Halt MD [facility] Pager [phone] 11/16/2022, 4:46 PM

## 2022-11-16 NOTE — Progress Notes (Signed)
EEG complete - results pending 

## 2022-11-16 NOTE — ED Notes (Signed)
Pt back from MRI 

## 2022-11-16 NOTE — ED Notes (Signed)
IV team informed that the fistula on the pt's left arm is dead and cauterized and there are not restrictions.  EMS attempted and IV twice on left, I attempted once on left and 1 IV team RN has attempted once.

## 2022-11-16 NOTE — ED Notes (Signed)
Help get patient cleaned up placed a clean brief patient is resting with call bell in reach

## 2022-11-16 NOTE — ED Notes (Signed)
Pt currently in MRI

## 2022-11-16 NOTE — Progress Notes (Signed)
This nurse assessed patient. Upon patient assessment. This nurse noted that patient has active fistulas to bilateral upper arms. Both sites have band aids to them which would indicate recent access/access attempts. Both have a pulse. At this time until further confirmation/documentation or orders placed. VAST will assess for hand PIVs at this time. Spoke to [employer] for clarification. Notified to re-consult if hand PIV needed. Vonna Kotyk RN VU. Fran Lowes, RN

## 2022-11-16 NOTE — Procedures (Signed)
[facility]  Routine EEG Report  Video Performed: Performed  Demographics: Patient Name:   [name], [name]  Date of Birth:   1951-09-13  Identification Number:   MRN - [id number]  Study Times:  Study Start Time:   11/16/2022 16:00:00 Study End Time:   11/16/2022 16:35:00  Indication(s): Encephalopathy  Technical Summary:  This EEG was performed utilizing standard International 10-20 System of electrode placement. One channel electrocardiogram was monitored. Data were obtained, stored, and interpreted utilizing referential montage recording, with reformatting to longitudinal, transverse bipolar, and referential montages as necessary for interpretation.  State(s):       Awake      Drowsy   Activation Procedures: Hyperventilation: Not performed Photic Stimulation: Not performed   EEG Description:  The patient appeared somnolent, the background showed an absent of posterior dominant alpha rhythm. There were continuous of diffuse 4-5 Hz theta more than 3 Hz delta activity noted.  The amplitudes were 30uV.  There were no physiologic sleep structures observed.  There was no clinical event noted.  Throughout the record, there was no epileptiform abnormality or lateralizing sign observed.  Intermittent myogenic and movement artifacts were noted.  Impression:  This is an abnormal EEG indicative of a moderately severe diffuse encephalopathy. This is non-specific in etiology.  No epileptiform abnormality, electrographic seizures or evidence of status epilepticus were present. No lateralizing sign is observed.    Dr Spero Curb    [facility] For Inpatient follow-up with [facility] physician please call [facility] [phone]. This is not an outpatient service. Post hospital discharge, please contact hospital directly.

## 2022-11-16 NOTE — Progress Notes (Signed)
D/W sister at bedside, patient was diagnosed with dysphagia on last admission and has been on pured diet.  Reexamined patient, still little confused as per sister, but appears that the facial droop has significantly improved.  Patient again reported does not remember what had happened this afternoon at HD center.

## 2022-11-16 NOTE — Consult Note (Signed)
[facility] Renal Consultation Note    Indication for Consultation:  Management of ESRD/hemodialysis, anemia, hypertension/volume, and secondary hyperparathyroidism. PCP:  HPI: [name] is a 71 y.o. female with ESRD, HTN, CAD/PAD who was admitted OBS with concern for possible stroke, L sided weakness.  Pt drowsy during admit - wakes easily but unable provide much Hx. Denies pain, cannot remember why she is here. Per notes, sent to ED from SNF with  L sided facial droop and L sided weakness - it was unclear how long this had been ongoing, ?possibly > 1d per notes. Neuro consulted, head CT negative. She was out of window for thrombolytic therapy. Full stroke work-up pending with plan for MRI and permissive HTN for 48hr.  Dialyzes on MWF schedule at [facility] - last full HD was Friday 12/1. She is due for HD today. No recent dialysis issues - uses RIGHT AVG. The L arm AVF is old, not being used at this time.  Past Medical History:  Diagnosis Date   Acute ischemic stroke ([place]) 02/23/2017   Acute on chronic diastolic CHF (congestive heart failure) ([place])    Adenomatous polyp of colon 10/2010, 2006, 2015   Anemia in CKD (chronic kidney disease) 11/07/2012   s/p blood transfusion.    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  Had some early rejection then stable function for years, then had slow decline of function and went back on hemodialysis in 2012.  Gets HD TTS schedule at [facility] still using L forearm AVF.   GERD (gastroesophageal reflux disease)    GI bleed 2017   felt to be ischemic colitis, last colo 2015   Hyperlipidemia     Hypertension    Neuromuscular disorder (HCC)    neuropathy hand and legs   Osteoporosis    Pseudoaneurysm of surgical AV fistula (HCC)    left upper arm   Past Surgical History:  Procedure Laterality Date   ABDOMINAL AORTOGRAM W/LOWER EXTREMITY N/A 03/26/2022   Procedure: ABDOMINAL AORTOGRAM W/LOWER EXTREMITY;  Surgeon: Marty Heck, MD;  Location: [place] [facility];  Service: Cardiovascular;  Laterality: N/A;  Left   AV FISTULA PLACEMENT     for dialysis   AV FISTULA PLACEMENT Left 11/22/2015   Procedure: ARTERIOVENOUS (AV) FISTULA CREATION-LEFT BRACHIOCEPHALIC;  Surgeon: Serafina Mitchell, MD;  Location: [place];  Service: Vascular;  Laterality: Left;   AV FISTULA PLACEMENT Right 03/15/2020   Procedure: INSERTION OF ARTERIOVENOUS (AV) GORE-TEX GRAFT ARM ( BRACHIAL AXILLARY );  Surgeon: Katha Cabal, MD;  Location: [facility];  Service: Vascular;  Laterality: Right;   AV FISTULA PLACEMENT Right 05/15/2022   Procedure: EXPLORATION  OF  RIGHT ARM ARTERIOVENOUS GORE-TEX GRAFT;  Surgeon: Angelia Mould, MD;  Location: [place];  Service: Vascular;  Laterality: Right;   [place] REMOVAL Right 04/27/2022   Procedure: REVISION OF RIGHT ARTERIOVENOUS GRAFT AND EXCISION OF RIGHT DEGENERATED GRAFT (AVG);  Surgeon: Angelia Mould, MD;  Location: [place];  Service: Vascular;  Laterality: Right;   BACK SURGERY     CERVICAL FUSION     CHOLECYSTECTOMY  12/02/2012  Procedure: LAPAROSCOPIC CHOLECYSTECTOMY WITH INTRAOPERATIVE CHOLANGIOGRAM;  Surgeon: Edward Jolly, MD;  Location: [place];  Service: General;  Laterality: N/A;   EYE SURGERY Bilateral    cataract surgery   EYE SURGERY Left 2019   laser   HEMATOMA EVACUATION Left 12/24/2016   Procedure: EVACUATION HEMATOMA LEFT UPPER ARM;  Surgeon: Waynetta Sandy, MD;  Location: [place];  Service: Vascular;  Laterality: Left;   I & D EXTREMITY Left 12/31/2016   Procedure: IRRIGATION AND DEBRIDEMENT EXTREMITY;  Surgeon: Angelia Mould, MD;  Location: [place];  Service: Vascular;  Laterality: Left;   INSERTION OF DIALYSIS CATHETER Right 12/24/2016   Procedure: INSERTION OF DIALYSIS CATHETER;  Surgeon: Waynetta Sandy, MD;  Location: [place];  Service: Vascular;  Laterality: Right;   INSERTION OF DIALYSIS CATHETER Right 02/04/2017   Procedure: INSERTION OF DIALYSIS CATHETER;  Surgeon: Waynetta Sandy, MD;  Location: [place];  Service: Vascular;  Laterality: Right;   [place] Left 10/23/2016   Procedure: Fistulagram;  Surgeon: Elam Dutch, MD;  Location: [place] [facility];  Service: Cardiovascular;  Laterality: Left;   PERIPHERAL VASCULAR INTERVENTION  03/26/2022   Procedure: PERIPHERAL VASCULAR INTERVENTION;  Surgeon: Marty Heck, MD;  Location: [place] [facility];  Service: Cardiovascular;;  left sfa   PERIPHERAL VASCULAR THROMBECTOMY Right 04/16/2020   Procedure: PERIPHERAL VASCULAR THROMBECTOMY;  Surgeon: Katha Cabal, MD;  Location: [place] [facility];  Service: Cardiovascular;  Laterality: Right;   RESECTION OF ARTERIOVENOUS FISTULA ANEURYSM Left 11/22/2015   Procedure: RESECTION OF LEFT RADIOCEPHALIC FISTULA ANEURYSM ;  Surgeon: Serafina Mitchell, MD;  Location: [place] [facility];  Service: Vascular;  Laterality: Left;   REVISON OF ARTERIOVENOUS FISTULA Left 12/22/2016   Procedure: REVISON OF LEFT ARTERIOVENOUS FISTULA;  Surgeon: Waynetta Sandy, MD;  Location: [place];  Service: Vascular;  Laterality: Left;   REVISON OF ARTERIOVENOUS FISTULA Left 02/04/2017   Procedure: REVISON OF LEFT UPPER ARM ARTERIOVENOUS FISTULA;  Surgeon: Waynetta Sandy, MD;  Location: [place];  Service: Vascular;  Laterality: Left;   UPPER EXTREMITY ANGIOGRAPHY Bilateral 09/19/2019   Procedure: UPPER EXTREMITY ANGIOGRAPHY;  Surgeon: Katha Cabal, MD;  Location: [place] [facility];  Service: Cardiovascular;  Laterality: Bilateral;   Family History   Problem Relation Age of Onset   Colon cancer Brother    Cancer Brother    Coronary artery disease Mother 85   Hyperlipidemia Mother    Hypertension Mother    Stroke Maternal Aunt    Esophageal cancer Neg Hx    Stomach cancer Neg Hx    Rectal cancer Neg Hx    Social History:  reports that she quit smoking about 30 years ago. Her smoking use included cigarettes. She has never been exposed to tobacco smoke. She has never used smokeless tobacco. She reports that she does not drink alcohol and does not use drugs.  ROS: As per HPI otherwise negative, unable to obtain.  Physical Exam: Vitals:   11/16/22 1515 11/16/22 1530 11/16/22 1545 11/16/22 1600  BP: (!) 168/64 (!) 160/65 (!) 165/62 (!) 179/56  Pulse: 84 89 88 90  Resp: '17 14 15 19  '$ Temp:      TempSrc:      SpO2: 100% 100% 100% 100%  Weight:         General: Frail woman, confused, [place] O2 in place. Head: L sided facial droop Neck: Supple without lymphadenopathy/masses. JVD not elevated.  Lungs: Clear bilaterally anteriorly. Breathing is unlabored. Heart: RRR with normal S1, S2. No murmurs, rubs, or gallops appreciated. Abdomen: Soft, non-tender, non-distended with normoactive bowel sounds.  Musculoskeletal:  Reduced muscle strength/tone Lower extremities: No edema or ischemic changes, no open wounds. Neuro: Drowsy but easily wakes, oriented to person only. Pronounced L arm weakness Dialysis Access: RUE AVG + bruit, old LUE AVF as well.  Allergies  Allergen Reactions   Sulfa Antibiotics Other (See Comments)    Both pt's parents allergic to this type of medication    Adhesive [Tape] Itching   Prior to Admission medications   Medication Sig Start Date End Date Taking? Authorizing Provider  albuterol (PROVENTIL) (2.5 MG/3ML) 0.083% nebulizer solution Take 3 mLs (2.5 mg total) by nebulization every 4 (four) hours as needed for wheezing. Patient taking differently: Take 2.5 mg by nebulization every 4 (four) hours as needed for  wheezing or shortness of breath. 05/04/22   Elgergawy, Silver Huguenin, MD  albuterol (VENTOLIN HFA) 108 (90 Base) MCG/ACT inhaler Inhale 1-2 puffs into the lungs every 6 (six) hours as needed for wheezing or shortness of breath. Patient taking differently: Inhale 2 puffs into the lungs every 6 (six) hours as needed for wheezing or shortness of breath. 01/11/22   Mesner, Corene Cornea, MD  atorvastatin (LIPITOR) 80 MG tablet Take 80 mg by mouth at bedtime. 03/25/22   [provider]  B Complex-C-Folic Acid (DIALYVITE 144) 0.8 MG WAFR Take 1 tablet by mouth every morning. 01/28/22   [provider]  benzonatate (TESSALON) 200 MG capsule Take 1 capsule (200 mg total) by mouth 2 (two) times daily as needed for cough. 09/15/22   Nita Sells, MD  cinacalcet (SENSIPAR) 30 MG tablet Take 1 tablet (30 mg total) by mouth every Monday, Wednesday, and Friday with hemodialysis. 10/26/22   Bonnielee Haff, MD  clopidogrel (PLAVIX) 75 MG tablet Take 1 tablet (75 mg total) by mouth daily. 03/26/22 03/26/23  Marty Heck, MD  Dextromethorphan-guaiFENesin 20-400 MG TABS Take 1 tablet by mouth in the morning and at bedtime.    [provider]  escitalopram (LEXAPRO) 10 MG tablet Take 1 tablet (10 mg total) by mouth every morning. Patient taking differently: Take 10 mg by mouth daily. 09/15/22   Nita Sells, MD  hydrocortisone (ANUSOL-HC) 2.5 % rectal cream Apply 1 Application topically 4 (four) times daily as needed for hemorrhoids. Patient taking differently: Apply 1 Application topically every 6 (six) hours as needed for hemorrhoids. 09/17/22   Rai, Vernelle Emerald, MD  hydrOXYzine (ATARAX) 10 MG tablet Take 1 tablet (10 mg total) by mouth 3 (three) times daily as needed for itching. Patient taking differently: Take 10 mg by mouth every 8 (eight) hours as needed for itching. 09/15/22   Nita Sells, MD  midodrine (PROAMATINE) 10 MG tablet Take 1 tablet (10 mg total) by mouth 3 (three)  times daily with meals. 05/21/22   Mercy Riding, MD  Nutritional Supplements (FEEDING SUPPLEMENT, NEPRO CARB STEADY,) LIQD Take 237 mLs by mouth 2 (two) times daily between meals. 10/27/22   Bonnielee Haff, MD  pantoprazole (PROTONIX) 40 MG tablet Take 1 tablet (40 mg total) by mouth daily. 08/18/21   Nita Sells, MD  sevelamer carbonate (RENVELA) 800 MG tablet Take 1 tablet (800 mg total) by mouth 3 (three) times daily with meals. 09/15/22   Nita Sells, MD  thiamine (VITAMIN B1) 100 MG tablet Take 1 tablet (100 mg total) by mouth daily. 10/27/22   Bonnielee Haff, MD  Current Facility-Administered Medications  Medication Dose Route Frequency Provider Last Rate Last Admin   [START ON 11/17/2022]  stroke: early stages of recovery book   Does not apply Once Wynetta Fines T, MD       acetaminophen (TYLENOL) tablet 650 mg  650 mg Oral Q4H PRN Lequita Halt, MD       Or   acetaminophen (TYLENOL) 160 MG/5ML solution 650 mg  650 mg Per Tube Q4H PRN Lequita Halt, MD       Or   acetaminophen (TYLENOL) suppository 650 mg  650 mg Rectal Q4H PRN Wynetta Fines T, MD       albuterol (PROVENTIL) (2.5 MG/3ML) 0.083% nebulizer solution 2.5 mg  2.5 mg Nebulization Q4H PRN Lequita Halt, MD       aspirin chewable tablet 81 mg  81 mg Oral Daily Derek Jack, MD       atorvastatin (LIPITOR) tablet 80 mg  80 mg Oral QHS Lequita Halt, MD       [START ON 11/18/2022] cinacalcet (SENSIPAR) tablet 30 mg  30 mg Oral Q M,W,F-HD Lequita Halt, MD       clopidogrel (PLAVIX) tablet 75 mg  75 mg Oral Daily Lequita Halt, MD       [START ON 11/17/2022] escitalopram (LEXAPRO) tablet 10 mg  10 mg Oral q morning Lequita Halt, MD       [START ON 11/17/2022] feeding supplement (NEPRO CARB STEADY) liquid 237 mL  237 mL Oral BID BM Lequita Halt, MD       heparin injection 5,000 Units  5,000 Units Subcutaneous Q8H Wynetta Fines T, MD       hydrocortisone (ANUSOL-HC) 2.5 % rectal cream 1 Application  1 Application Topical  QID PRN Lequita Halt, MD       hydrOXYzine (ATARAX) tablet 10 mg  10 mg Oral TID PRN Lequita Halt, MD       midodrine (PROAMATINE) tablet 10 mg  10 mg Oral TID WC Lequita Halt, MD       pantoprazole (PROTONIX) EC tablet 40 mg  40 mg Oral Daily Wynetta Fines T, MD       senna-docusate (Senokot-S) tablet 1 tablet  1 tablet Oral QHS PRN Lequita Halt, MD       sevelamer carbonate (RENVELA) tablet 800 mg  800 mg Oral TID WC Wynetta Fines T, MD       sodium chloride flush (NS) 0.9 % injection 3 mL  3 mL Intravenous Once Cristie Hem, MD       thiamine (VITAMIN B1) tablet 100 mg  100 mg Oral Daily Lequita Halt, MD       Current Outpatient Medications  Medication Sig Dispense Refill   albuterol (PROVENTIL) (2.5 MG/3ML) 0.083% nebulizer solution Take 3 mLs (2.5 mg total) by nebulization every 4 (four) hours as needed for wheezing. (Patient taking differently: Take 2.5 mg by nebulization every 4 (four) hours as needed for wheezing or shortness of breath.) 75 mL 12   albuterol (VENTOLIN HFA) 108 (90 Base) MCG/ACT inhaler Inhale 1-2 puffs into the lungs every 6 (six) hours as needed for wheezing or shortness of breath. (Patient taking differently: Inhale 2 puffs into the lungs every 6 (six) hours as needed for wheezing or shortness of breath.) 1 each 0   atorvastatin (LIPITOR) 80 MG tablet Take 80 mg by mouth at bedtime.     B Complex-C-Folic Acid (DIALYVITE 509) 0.8  MG WAFR Take 1 tablet by mouth every morning.     benzonatate (TESSALON) 200 MG capsule Take 1 capsule (200 mg total) by mouth 2 (two) times daily as needed for cough. 20 capsule 0   cinacalcet (SENSIPAR) 30 MG tablet Take 1 tablet (30 mg total) by mouth every Monday, Wednesday, and Friday with hemodialysis. 60 tablet    clopidogrel (PLAVIX) 75 MG tablet Take 1 tablet (75 mg total) by mouth daily. 30 tablet 11   Dextromethorphan-guaiFENesin 20-400 MG TABS Take 1 tablet by mouth in the morning and at bedtime.     escitalopram (LEXAPRO) 10  MG tablet Take 1 tablet (10 mg total) by mouth every morning. (Patient taking differently: Take 10 mg by mouth daily.) 8 tablet 0   hydrocortisone (ANUSOL-HC) 2.5 % rectal cream Apply 1 Application topically 4 (four) times daily as needed for hemorrhoids. (Patient taking differently: Apply 1 Application topically every 6 (six) hours as needed for hemorrhoids.) 30 g 0   hydrOXYzine (ATARAX) 10 MG tablet Take 1 tablet (10 mg total) by mouth 3 (three) times daily as needed for itching. (Patient taking differently: Take 10 mg by mouth every 8 (eight) hours as needed for itching.) 30 tablet 0   midodrine (PROAMATINE) 10 MG tablet Take 1 tablet (10 mg total) by mouth 3 (three) times daily with meals.     Nutritional Supplements (FEEDING SUPPLEMENT, NEPRO CARB STEADY,) LIQD Take 237 mLs by mouth 2 (two) times daily between meals.  0   pantoprazole (PROTONIX) 40 MG tablet Take 1 tablet (40 mg total) by mouth daily. 60 tablet    sevelamer carbonate (RENVELA) 800 MG tablet Take 1 tablet (800 mg total) by mouth 3 (three) times daily with meals.     thiamine (VITAMIN B1) 100 MG tablet Take 1 tablet (100 mg total) by mouth daily.     Labs: Basic Metabolic Panel: Recent Labs  Lab 11/16/22 1131 11/16/22 1135  NA 137 136  K 4.5 4.4  CL 100 99  CO2 24  --   GLUCOSE 120* 117*  BUN 34* 35*  CREATININE 4.37* 4.60*  CALCIUM 9.6  --    Liver Function Tests: Recent Labs  Lab 11/16/22 1131  AST 22  ALT 15  ALKPHOS 91  BILITOT 1.4*  PROT 6.1*  ALBUMIN 3.2*   CBC: Recent Labs  Lab 11/16/22 1131 11/16/22 1135  WBC 8.2  --   NEUTROABS 5.3  --   HGB 9.8* 10.5*  HCT 31.7* 31.0*  MCV 105.7*  --   PLT 163  --    Studies/Results: CT HEAD CODE STROKE WO CONTRAST  Result Date: 11/16/2022 CLINICAL DATA:  Code stroke. Neuro deficit, acute, stroke suspected. EXAM: CT HEAD WITHOUT CONTRAST TECHNIQUE: Contiguous axial images were obtained from the base of the skull through the vertex without intravenous  contrast. RADIATION DOSE REDUCTION: This exam was performed according to the departmental dose-optimization program which includes automated exposure control, adjustment of the mA and/or kV according to patient size and/or use of iterative reconstruction technique. COMPARISON:  Head CT 10/19/2022 and MRI 10/21/2022 FINDINGS: Brain: There is no evidence of an acute cortically based infarct, mass, midline shift, or extra-axial fluid collection. Confluent hypodensities in the cerebral white matter bilaterally are similar to the prior CT and are nonspecific but compatible with severe chronic small vessel ischemic disease. Chronic lacunar infarcts are again noted in the deep gray nuclei and pons. There is moderate cerebral atrophy. Vascular: Calcified atherosclerosis at the skull base. No  hyperdense vessel. Skull: No acute fracture or suspicious osseous lesion. Sinuses/Orbits: The visualized paranasal sinuses are clear. Persistent moderate right and small left mastoid effusions. Bilateral cataract extraction. Other: None. ASPECTS ([place] Stroke Program Early CT Score) - Ganglionic level infarction (caudate, lentiform nuclei, internal capsule, insula, M1-M3 cortex): 7 - Supraganglionic infarction (M4-M6 cortex): 3 Total score (0-10 with 10 being normal): 10 These results were communicated to Dr. Quinn Axe at 11:50 am on 11/16/2022 by text page via the [facility] messaging system. IMPRESSION: 1. No evidence of acute intracranial abnormality. 2. Severe chronic small vessel ischemic disease. Electronically Signed   By: Logan Bores M.D.   On: 11/16/2022 11:50    Dialysis Orders:  MWF at [facility] 4hr, 350/600, EDW 43.5kg, 2K/2Ca, UFP #2, AVG, 16g - Sensipar '30mg'$  PO q HD - Hectoral 36mg IV q HD - Mircera 330m Iv q 2 weeks (last 12/1)  Assessment/Plan:  Presumed CVA with L facial droop, L sided weakness: Full work-up per neuro.  ESRD:  Usual MWF schedule -> planning on HD tomorrow, see if mental status improves. No heparin and  low UF goal to prevent hypotension.  Hypertension/volume: BP high, permissive HTN per neuro.  Anemia: Hgb 11.5, no ESA needed at the moment.  Metabolic bone disease: CorrCa slightly high, will hold VDRA. Restart binders/sensipar in future.  CAD/PAD  KaVeneta PentonPA-C 11/16/2022, 4:25 PM  [facility]

## 2022-11-16 NOTE — ED Triage Notes (Signed)
PT BIB [facility].  PT resided at [place] placed but retrieved pt from a transport that was transporting the pt to dialysis.  PT was having trouble sitting up and EMS noted left sided weakness and facial droop.  EMS states pt is A&O x4. Hx of afib and xarelto in their charting system.    Initial LKW was 10:30.  Upon Dr. Quinn Axe speaking with [facility] it became unclear when she was LKW.  LKW is considered unknown at this time.

## 2022-11-16 NOTE — ED Notes (Signed)
Sister at bedside that confirms pt uses right fistula for dialysis. Will attempt IV on left.

## 2022-11-16 NOTE — ED Provider Notes (Addendum)
[facility] EMERGENCY DEPARTMENT Provider Note  CSN: [id number] Arrival date & time: 11/16/22 1126  Chief Complaint(s) No chief complaint on file.  HPI [name] is a 71 y.o. female with history of stroke, CHF, coronary artery disease, end-stage renal disease on HD, hypertension, hyperlipidemia presenting with altered mental status.  Patient was on her way to dialysis, apparently transport driver felt patient was unable to sit up and called EMS, EMS noticed left-sided weakness and facial droop.  Patient per EMS was last known normal at 1030.  Her nursing home was contacted by the neurology team who were unable to determine an accurate last known well time.  Patient denies any complaints.  She is not sure why she is in the hospital.  History limited due to altered mental status   Past Medical History Past Medical History:  Diagnosis Date   Acute ischemic stroke ([place]) 02/23/2017   Acute on chronic diastolic CHF (congestive heart failure) ([place])    Adenomatous polyp of colon 10/2010, 2006, 2015   Anemia in CKD (chronic kidney disease) 11/07/2012   s/p blood transfusion.    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  Had some early rejection then stable function for years, then had slow decline of function and went back on hemodialysis in 2012.  Gets HD TTS schedule at [facility] on [facility] still using L forearm AVF.   GERD (gastroesophageal reflux disease)    GI bleed 2017   felt to be ischemic colitis, last colo 2015   Hyperlipidemia    Hypertension    Neuromuscular disorder (HCC)    neuropathy hand and legs   Osteoporosis     Pseudoaneurysm of surgical AV fistula (HCC)    left upper arm   Patient Active Problem List   Diagnosis Date Noted   Pressure injury of skin 10/21/2022   Acute encephalopathy 10/19/2022   Transaminitis 09/10/2022   Dermatitis 05/10/2022   Physical deconditioning 05/07/2022   Hypervolemia associated with renal insufficiency 05/06/2022   DNR (do not resuscitate) 05/06/2022   Septic shock vs. hypovolemic shock in setting of covid 19     MSSA bacteremia    Vascular graft infection  and MSSA bacteremia 04/26/2022   Arteriovenous fistula, acquired ([place]) 06/06/7627   Cyclical vomiting syndrome 02/19/2022   Dysphagia 02/19/2022   Anemia of chronic renal failure 02/19/2022   Hypertensive heart and chronic kidney disease with heart failure and with stage 5 chronic kidney disease, or end stage renal disease ([place]) 02/19/2022   Mild cognitive impairment 02/19/2022   Major depression, single episode 02/19/2022   Fall at home, initial encounter 01/23/2022   Acute on chronic diastolic CHF 31/51/7616   GERD (gastroesophageal reflux disease)    Other disorders of calcium metabolism 10/17/2021   Protein-calorie malnutrition, severe 08/12/2021   Acute respiratory failure with hypoxia ([place]) 08/06/2021   Major depressive disorder, recurrent episode, moderate ([place]) 02/23/2021   Prolonged QT interval 02/21/2021   Allergy, unspecified, initial encounter 08/08/2020   NSTEMI 05/14/2020   Hypotension 03/16/2020   Ventricular tachycardia ([place]) 03/15/2020   Seizure ([place]) 07/37/1062   Complication of vascular access for dialysis  12/21/2019   Breakdown (mechanical) of surgically created arteriovenous fistula, initial encounter ([place]) 12/18/2019   Weakness 10/11/2019   Aneurysm artery, 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 difficile diarrhea 04/17/2019   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   Primary generalized (osteo)arthritis 04/17/2019   Pure hypercholesterolemia, unspecified 04/17/2019   Secondary hyperparathyroidism of renal origin ([place]) 04/17/2019   Gastro-esophageal reflux disease without esophagitis 04/17/2019   Essential (primary) hypertension 04/17/2019   Transient cerebral ischemic attack, unspecified 04/17/2019   Prolonged Q-T interval on ECG 02/24/2017   Malnutrition of moderate degree 12/24/2016   Problem with dialysis access [facility]) 12/21/2016   Neurologic abnormality 11/19/2015   Anxiety 11/19/2015   Insomnia 11/19/2015   Gait instability    Dizziness 05/09/2015   Ataxia 05/09/2015   H/O: CVA (cerebrovascular accident) 05/09/2015   Left facial numbness 05/09/2015   Left leg numbness 05/09/2015   Hyperlipidemia    SOB (shortness of breath) 04/01/2013   ESRD on dialysis ([place]) 11/07/2012   Dyspnea 12/31/2011   Home Medication(s) Prior to Admission medications   Medication Sig Start Date End Date Taking? Authorizing Provider  albuterol (PROVENTIL) (2.5 MG/3ML) 0.083% nebulizer solution Take 3 mLs (2.5 mg total) by nebulization every 4 (four) hours as needed for wheezing. Patient taking differently: Take 2.5 mg by nebulization every 4 (four) hours as needed for wheezing or shortness of breath. 05/04/22   Elgergawy, Silver Huguenin, MD  albuterol (VENTOLIN HFA) 108 (90 Base) MCG/ACT inhaler Inhale 1-2 puffs into the lungs every 6 (six) hours as needed for wheezing or shortness of breath. Patient taking differently: Inhale 2 puffs into the lungs every 6 (six) hours as needed for wheezing or shortness of breath. 01/11/22   Mesner, Corene Cornea, MD  atorvastatin (LIPITOR) 80 MG tablet Take 80 mg by mouth at bedtime. 03/25/22   [provider]  B Complex-C-Folic Acid (DIALYVITE 606) 0.8 MG WAFR Take 1 tablet  by mouth every morning. 01/28/22   [provider]  benzonatate (TESSALON) 200 MG capsule Take 1 capsule (200 mg total) by mouth 2 (two) times daily as needed for cough. 09/15/22   Nita Sells, MD  cinacalcet (SENSIPAR) 30 MG tablet Take 1 tablet (30 mg total) by mouth every Monday, Wednesday, and Friday with hemodialysis. 10/26/22   Bonnielee Haff, MD  clopidogrel (PLAVIX) 75 MG tablet Take 1 tablet (75 mg total) by mouth daily. 03/26/22 03/26/23  Marty Heck, MD  Dextromethorphan-guaiFENesin 20-400 MG TABS Take 1 tablet by mouth in the morning and at bedtime.    [provider]  escitalopram (LEXAPRO) 10 MG tablet Take 1 tablet (10 mg total) by mouth every morning. Patient taking differently: Take 10 mg by mouth daily. 09/15/22   Nita Sells, MD  hydrocortisone (ANUSOL-HC) 2.5 % rectal cream Apply 1 Application topically 4 (four) times daily as needed for hemorrhoids. Patient taking differently: Apply 1 Application topically every 6 (six) hours as needed for hemorrhoids. 09/17/22   Rai, Vernelle Emerald, MD  hydrOXYzine (ATARAX) 10 MG tablet Take 1 tablet (10 mg total) by mouth 3 (three) times daily as needed for itching. Patient taking differently: Take 10 mg by mouth every 8 (eight) hours as needed for itching. 09/15/22   Nita Sells, MD  midodrine (PROAMATINE) 10 MG tablet Take  1 tablet (10 mg total) by mouth 3 (three) times daily with meals. 05/21/22   Mercy Riding, MD  Nutritional Supplements (FEEDING SUPPLEMENT, NEPRO CARB STEADY,) LIQD Take 237 mLs by mouth 2 (two) times daily between meals. 10/27/22   Bonnielee Haff, MD  pantoprazole (PROTONIX) 40 MG tablet Take 1 tablet (40 mg total) by mouth daily. 08/18/21   Nita Sells, MD  sevelamer carbonate (RENVELA) 800 MG tablet Take 1 tablet (800 mg total) by mouth 3 (three) times daily with meals. 09/15/22   Nita Sells, MD  thiamine (VITAMIN B1) 100 MG tablet Take 1 tablet (100 mg total) by  mouth daily. 10/27/22   Bonnielee Haff, MD                                                                                                                                    Past Surgical History Past Surgical History:  Procedure Laterality Date   ABDOMINAL AORTOGRAM W/LOWER EXTREMITY N/A 03/26/2022   Procedure: ABDOMINAL AORTOGRAM W/LOWER EXTREMITY;  Surgeon: Marty Heck, MD;  Location: [facility];  Service: Cardiovascular;  Laterality: N/A;  Left   AV FISTULA PLACEMENT     for dialysis   AV FISTULA PLACEMENT Left 11/22/2015   Procedure: ARTERIOVENOUS (AV) FISTULA CREATION-LEFT BRACHIOCEPHALIC;  Surgeon: Serafina Mitchell, MD;  Location: [facility];  Service: Vascular;  Laterality: Left;   AV FISTULA PLACEMENT Right 03/15/2020   Procedure: INSERTION OF ARTERIOVENOUS (AV) GORE-TEX GRAFT ARM ( BRACHIAL AXILLARY );  Surgeon: Katha Cabal, MD;  Location: [facility];  Service: Vascular;  Laterality: Right;   AV FISTULA PLACEMENT Right 05/15/2022   Procedure: EXPLORATION  OF  RIGHT ARM ARTERIOVENOUS GORE-TEX GRAFT;  Surgeon: Angelia Mould, MD;  Location: [facility];  Service: Vascular;  Laterality: Right;   [place] REMOVAL Right 04/27/2022   Procedure: REVISION OF RIGHT ARTERIOVENOUS GRAFT AND EXCISION OF RIGHT DEGENERATED GRAFT (AVG);  Surgeon: Angelia Mould, MD;  Location: [facility];  Service: Vascular;  Laterality: Right;   BACK SURGERY     CERVICAL FUSION     CHOLECYSTECTOMY  12/02/2012   Procedure: LAPAROSCOPIC CHOLECYSTECTOMY WITH INTRAOPERATIVE CHOLANGIOGRAM;  Surgeon: Edward Jolly, MD;  Location: [facility];  Service: General;  Laterality: N/A;   EYE SURGERY Bilateral    cataract surgery   EYE SURGERY Left 2019   laser   HEMATOMA EVACUATION Left 12/24/2016   Procedure: EVACUATION HEMATOMA LEFT UPPER ARM;  Surgeon: Waynetta Sandy, MD;  Location: [facility];  Service: Vascular;  Laterality: Left;   I & D EXTREMITY Left 12/31/2016   Procedure: IRRIGATION AND DEBRIDEMENT  EXTREMITY;  Surgeon: Angelia Mould, MD;  Location: [facility];  Service: Vascular;  Laterality: Left;   INSERTION OF DIALYSIS CATHETER Right 12/24/2016   Procedure: INSERTION OF DIALYSIS CATHETER;  Surgeon: Waynetta Sandy, MD;  Location: [facility];  Service: Vascular;  Laterality: Right;   INSERTION  OF DIALYSIS CATHETER Right 02/04/2017   Procedure: INSERTION OF DIALYSIS CATHETER;  Surgeon: Waynetta Sandy, MD;  Location: [facility];  Service: Vascular;  Laterality: Right;   [place] Left 10/23/2016   Procedure: Fistulagram;  Surgeon: Elam Dutch, MD;  Location: [facility];  Service: Cardiovascular;  Laterality: Left;   PERIPHERAL VASCULAR INTERVENTION  03/26/2022   Procedure: PERIPHERAL VASCULAR INTERVENTION;  Surgeon: Marty Heck, MD;  Location: [facility];  Service: Cardiovascular;;  left sfa   PERIPHERAL VASCULAR THROMBECTOMY Right 04/16/2020   Procedure: PERIPHERAL VASCULAR THROMBECTOMY;  Surgeon: Katha Cabal, MD;  Location: [facility];  Service: Cardiovascular;  Laterality: Right;   RESECTION OF ARTERIOVENOUS FISTULA ANEURYSM Left 11/22/2015   Procedure: RESECTION OF LEFT RADIOCEPHALIC FISTULA ANEURYSM ;  Surgeon: Serafina Mitchell, MD;  Location: [facility];  Service: Vascular;  Laterality: Left;   REVISON OF ARTERIOVENOUS FISTULA Left 12/22/2016   Procedure: REVISON OF LEFT ARTERIOVENOUS FISTULA;  Surgeon: Waynetta Sandy, MD;  Location: [facility];  Service: Vascular;  Laterality: Left;   REVISON OF ARTERIOVENOUS FISTULA Left 02/04/2017   Procedure: REVISON OF LEFT UPPER ARM ARTERIOVENOUS FISTULA;  Surgeon: Waynetta Sandy, MD;  Location: [facility];  Service: Vascular;  Laterality: Left;   UPPER EXTREMITY ANGIOGRAPHY Bilateral 09/19/2019   Procedure: UPPER EXTREMITY ANGIOGRAPHY;  Surgeon: Katha Cabal, MD;  Location: [facility];  Service: Cardiovascular;  Laterality:  Bilateral;   Family History Family History  Problem Relation Age of Onset   Colon cancer Brother    Cancer Brother    Coronary artery disease Mother 42   Hyperlipidemia Mother    Hypertension Mother    Stroke Maternal Aunt    Esophageal cancer Neg Hx    Stomach cancer Neg Hx    Rectal cancer Neg Hx     Social History Social History   Tobacco Use   Smoking status: Former    Types: Cigarettes    Quit date: 12/31/1991    Years since quitting: 30.8    Passive exposure: Never   Smokeless tobacco: Never  Vaping Use   Vaping Use: Never used  Substance Use Topics   Alcohol use: No    Alcohol/week: 0.0 standard drinks of alcohol   Drug use: No   Allergies Sulfa antibiotics and Adhesive [tape]  Review of Systems Review of Systems  All other systems reviewed and are negative.   Physical Exam Vital Signs  I have reviewed the triage vital signs BP (!) 169/69   Pulse 89   Temp 97.6 F (36.4 C) (Oral)   Resp 16   Wt 46.2 kg   SpO2 100%   BMI 18.63 kg/m  Physical Exam Vitals and nursing note reviewed.  Constitutional:      General: She is not in acute distress.    Appearance: She is well-developed.     Comments: Chronically ill-appearing  HENT:     Head: Normocephalic and atraumatic.     Mouth/Throat:     Mouth: Mucous membranes are moist.  Eyes:     Pupils: Pupils are equal, round, and reactive to light.  Cardiovascular:     Rate and Rhythm: Normal rate and regular rhythm.     Heart sounds: No murmur heard. Pulmonary:     Effort: Pulmonary effort is normal. No respiratory distress.     Breath sounds: Normal breath sounds.  Abdominal:     General: Abdomen  is flat.     Palpations: Abdomen is soft.     Tenderness: There is no abdominal tenderness.  Musculoskeletal:        General: No tenderness.     Right lower leg: No edema.     Left lower leg: No edema.  Skin:    General: Skin is warm and dry.  Neurological:     Mental Status: She is alert.      Comments: Slight left-sided facial droop, 4+ out of 5 strength in the left upper extremity, 4+ out of 5 in the right upper extremity.  5 out of 5 in the bilateral lower extremity.  No aphasia, numbness or tingling to light touch.  Gait testing deferred.  Patient oriented to self, that she is in the hospital but not which hospital, not to year, not to situation  Psychiatric:        Mood and Affect: Mood normal.        Behavior: Behavior normal.     ED Results and Treatments Labs (all labs ordered are listed, but only abnormal results are displayed) Labs Reviewed  CBC - Abnormal; Notable for the following components:      Result Value   RBC 3.00 (*)    Hemoglobin 9.8 (*)    HCT 31.7 (*)    MCV 105.7 (*)    RDW 18.6 (*)    All other components within normal limits  COMPREHENSIVE METABOLIC PANEL - Abnormal; Notable for the following components:   Glucose, Bld 120 (*)    BUN 34 (*)    Creatinine, Ser 4.37 (*)    Total Protein 6.1 (*)    Albumin 3.2 (*)    Total Bilirubin 1.4 (*)    GFR, Estimated 10 (*)    All other components within normal limits  I-STAT CHEM 8, ED - Abnormal; Notable for the following components:   BUN 35 (*)    Creatinine, Ser 4.60 (*)    Glucose, Bld 117 (*)    Calcium, Ion 1.05 (*)    Hemoglobin 10.5 (*)    HCT 31.0 (*)    All other components within normal limits  CBG MONITORING, ED - Abnormal; Notable for the following components:   Glucose-Capillary 122 (*)    All other components within normal limits  DIFFERENTIAL  ETHANOL  PROTIME-INR  APTT                                                                                                                          Radiology CT HEAD CODE STROKE WO CONTRAST  Result Date: 11/16/2022 CLINICAL DATA:  Code stroke. Neuro deficit, acute, stroke suspected. EXAM: CT HEAD WITHOUT CONTRAST TECHNIQUE: Contiguous axial images were obtained from the base of the skull through the vertex without intravenous contrast.  RADIATION DOSE REDUCTION: This exam was performed according to the departmental dose-optimization program which includes automated exposure control, adjustment of the mA and/or kV according to patient size and/or use of  iterative reconstruction technique. COMPARISON:  Head CT 10/19/2022 and MRI 10/21/2022 FINDINGS: Brain: There is no evidence of an acute cortically based infarct, mass, midline shift, or extra-axial fluid collection. Confluent hypodensities in the cerebral white matter bilaterally are similar to the prior CT and are nonspecific but compatible with severe chronic small vessel ischemic disease. Chronic lacunar infarcts are again noted in the deep gray nuclei and pons. There is moderate cerebral atrophy. Vascular: Calcified atherosclerosis at the skull base. No hyperdense vessel. Skull: No acute fracture or suspicious osseous lesion. Sinuses/Orbits: The visualized paranasal sinuses are clear. Persistent moderate right and small left mastoid effusions. Bilateral cataract extraction. Other: None. ASPECTS ([place] Stroke Program Early CT Score) - Ganglionic level infarction (caudate, lentiform nuclei, internal capsule, insula, M1-M3 cortex): 7 - Supraganglionic infarction (M4-M6 cortex): 3 Total score (0-10 with 10 being normal): 10 These results were communicated to Dr. Quinn Axe at 11:50 am on 11/16/2022 by text page via the [facility] messaging system. IMPRESSION: 1. No evidence of acute intracranial abnormality. 2. Severe chronic small vessel ischemic disease. Electronically Signed   By: Logan Bores M.D.   On: 11/16/2022 11:50    Pertinent labs & imaging results that were available during my care of the patient were reviewed by me and considered in my medical decision making (see MDM for details).  Medications Ordered in ED Medications  sodium chloride flush (NS) 0.9 % injection 3 mL (has no administration in time range)                                                                                                                                      Procedures .Critical Care  Performed by: Cristie Hem, MD Authorized by: Cristie Hem, MD   Critical care provider statement:    Critical care time (minutes):  30   Critical care was necessary to treat or prevent imminent or life-threatening deterioration of the following conditions:  CNS failure or compromise   Critical care was time spent personally by me on the following activities:  Development of treatment plan with patient or surrogate, discussions with consultants, evaluation of patient's response to treatment, examination of patient, ordering and review of laboratory studies, ordering and review of radiographic studies, ordering and performing treatments and interventions, pulse oximetry, re-evaluation of patient's condition and review of old charts   (including critical care time)  Medical Decision Making / ED Course   MDM:  71 year old female presenting to the emergency department with altered mental status.  Patient found to be slumped over in dialysis transport.  Currently patient oriented to self and somewhat to place but does not know why she is in the emergency department.  CT head without evidence of acute stroke.  tPA was considered but not given as patient's last known well time was unknown.  She does appear to have some left-sided weakness, neurology recommends admission for further management, will order MRI  and CT angiography of the head and neck.  Patient denies fevers or any focal infectious symptoms, does not make urine, examination otherwise nonfocal.  Her laboratory workup is notable for signs of end-stage renal disease but no acute electrolyte imbalance to suggest any obvious cause of a toxic or metabolic encephalopathy.  Clinical Course as of 11/16/22 1518  Mon Nov 16, 2022  1448 Discussed with Dr. Markus Jarvis who will admit for generalized weakness/further stroke work up.  [WS]    Clinical Course User  Index [WS] Cristie Hem, MD     Additional history obtained: -Additional history obtained from spouse -External records from outside source obtained and reviewed including: Chart review including previous notes, labs, imaging, consultation notes including D/C summary from mid november   Lab Tests: -I ordered, reviewed, and interpreted labs.   The pertinent results include:   Labs Reviewed  CBC - Abnormal; Notable for the following components:      Result Value   RBC 3.00 (*)    Hemoglobin 9.8 (*)    HCT 31.7 (*)    MCV 105.7 (*)    RDW 18.6 (*)    All other components within normal limits  COMPREHENSIVE METABOLIC PANEL - Abnormal; Notable for the following components:   Glucose, Bld 120 (*)    BUN 34 (*)    Creatinine, Ser 4.37 (*)    Total Protein 6.1 (*)    Albumin 3.2 (*)    Total Bilirubin 1.4 (*)    GFR, Estimated 10 (*)    All other components within normal limits  I-STAT CHEM 8, ED - Abnormal; Notable for the following components:   BUN 35 (*)    Creatinine, Ser 4.60 (*)    Glucose, Bld 117 (*)    Calcium, Ion 1.05 (*)    Hemoglobin 10.5 (*)    HCT 31.0 (*)    All other components within normal limits  CBG MONITORING, ED - Abnormal; Notable for the following components:   Glucose-Capillary 122 (*)    All other components within normal limits  DIFFERENTIAL  ETHANOL  PROTIME-INR  APTT    Notable for chronic ESRD findings, anemia  EKG   EKG Interpretation  Date/Time:  Monday November 16 2022 11:53:51 EST Ventricular Rate:  85 PR Interval:  181 QRS Duration: 104 QT Interval:  421 QTC Calculation: 501 R Axis:   91 Text Interpretation: Sinus rhythm Right axis deviation Nonspecific T abnormalities, lateral leads Prolonged QT interval Confirmed by Garnette Gunner [phone]) on 11/16/2022 1:57:02 PM         Imaging Studies ordered: I ordered imaging studies including CT head On my interpretation imaging demonstrates no acute bleed I  independently visualized and interpreted imaging. I agree with the radiologist interpretation   Medicines ordered and prescription drug management: Meds ordered this encounter  Medications   sodium chloride flush (NS) 0.9 % injection 3 mL    -I have reviewed the patients home medicines and have made adjustments as needed   Consultations Obtained: I requested consultation with the neurologist,  and discussed lab and imaging findings as well as pertinent plan - they recommend: admit for further stroke workup   Cardiac Monitoring: The patient was maintained on a cardiac monitor.  I personally viewed and interpreted the cardiac monitored which showed an underlying rhythm of: NSR  Social Determinants of Health:  Diagnosis or treatment significantly limited by social determinants of health: former smoker   Reevaluation: After the interventions noted above, I reevaluated the  patient and found that they have stayed the same  Co morbidities that complicate the patient evaluation  Past Medical History:  Diagnosis Date   Acute ischemic stroke ([place]) 02/23/2017   Acute on chronic diastolic CHF (congestive heart failure) ([place])    Adenomatous polyp of colon 10/2010, 2006, 2015   Anemia in CKD (chronic kidney disease) 11/07/2012   s/p blood transfusion.    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  Had some early rejection then stable function for years, then had slow decline of function and went back on hemodialysis in 2012.  Gets HD TTS schedule at [facility] still using L forearm AVF.   GERD (gastroesophageal reflux disease)    GI bleed 2017    felt to be ischemic colitis, last colo 2015   Hyperlipidemia    Hypertension    Neuromuscular disorder (HCC)    neuropathy hand and legs   Osteoporosis    Pseudoaneurysm of surgical AV fistula (HCC)    left upper arm      Dispostion: Disposition decision including need for hospitalization was considered, and patient admitted to the hospital.    Final Clinical Impression(s) / ED Diagnoses Final diagnoses:  Generalized weakness  Left-sided weakness     This chart was dictated using voice recognition software.  Despite best efforts to proofread,  errors can occur which can change the documentation meaning.    Cristie Hem, MD 11/16/22 1517    Cristie Hem, MD 11/16/22 1518    Cristie Hem, MD 11/16/22 1524

## 2022-11-17 ENCOUNTER — Observation Stay (HOSPITAL_COMMUNITY): Payer: Medicare Other

## 2022-11-17 DIAGNOSIS — I679 Cerebrovascular disease, unspecified: Secondary | ICD-10-CM | POA: Insufficient documentation

## 2022-11-17 DIAGNOSIS — R2990 Unspecified symptoms and signs involving the nervous system: Secondary | ICD-10-CM

## 2022-11-17 DIAGNOSIS — T80818A Extravasation of other vesicant agent, initial encounter: Secondary | ICD-10-CM | POA: Insufficient documentation

## 2022-11-17 DIAGNOSIS — E873 Alkalosis: Secondary | ICD-10-CM

## 2022-11-17 LAB — I-STAT VENOUS BLOOD GAS, ED
Acid-Base Excess: 7 mmol/L — ABNORMAL HIGH (ref 0.0–2.0)
Acid-Base Excess: 7 mmol/L — ABNORMAL HIGH (ref 0.0–2.0)
Bicarbonate: 25.7 mmol/L (ref 20.0–28.0)
Bicarbonate: 27.2 mmol/L (ref 20.0–28.0)
Calcium, Ion: 0.85 mmol/L — CL (ref 1.15–1.40)
Calcium, Ion: 0.92 mmol/L — ABNORMAL LOW (ref 1.15–1.40)
HCT: 27 % — ABNORMAL LOW (ref 36.0–46.0)
HCT: 29 % — ABNORMAL LOW (ref 36.0–46.0)
Hemoglobin: 9.2 g/dL — ABNORMAL LOW (ref 12.0–15.0)
Hemoglobin: 9.9 g/dL — ABNORMAL LOW (ref 12.0–15.0)
O2 Saturation: 84 %
O2 Saturation: 97 %
Potassium: 4.6 mmol/L (ref 3.5–5.1)
Potassium: 4.7 mmol/L (ref 3.5–5.1)
Sodium: 134 mmol/L — ABNORMAL LOW (ref 135–145)
Sodium: 135 mmol/L (ref 135–145)
TCO2: 26 mmol/L (ref 22–32)
TCO2: 28 mmol/L (ref 22–32)
pCO2, Ven: 19.2 mmHg — CL (ref 44–60)
pCO2, Ven: 25.3 mmHg — ABNORMAL LOW (ref 44–60)
pH, Ven: 7.639 (ref 7.25–7.43)
pH, Ven: 7.734 (ref 7.25–7.43)
pO2, Ven: 38 mmHg (ref 32–45)
pO2, Ven: 62 mmHg — ABNORMAL HIGH (ref 32–45)

## 2022-11-17 LAB — AMMONIA: Ammonia: 24 umol/L (ref 9–35)

## 2022-11-17 LAB — PROTIME-INR
INR: 1.2 (ref 0.8–1.2)
Prothrombin Time: 15.3 seconds — ABNORMAL HIGH (ref 11.4–15.2)

## 2022-11-17 LAB — T4, FREE: Free T4: 1.01 ng/dL (ref 0.61–1.12)

## 2022-11-17 LAB — TSH: TSH: 5.999 u[IU]/mL — ABNORMAL HIGH (ref 0.350–4.500)

## 2022-11-17 LAB — LIPID PANEL
Cholesterol: 139 mg/dL (ref 0–200)
HDL: 55 mg/dL (ref >40)
LDL Cholesterol: 61 mg/dL (ref 0–99)
Total CHOL/HDL Ratio: 2.5 RATIO
Triglycerides: 114 mg/dL (ref <150)
VLDL: 23 mg/dL (ref 0–40)

## 2022-11-17 LAB — HEPATITIS B SURFACE ANTIGEN: Hepatitis B Surface Ag: NONREACTIVE

## 2022-11-17 LAB — APTT: aPTT: 34 seconds (ref 24–36)

## 2022-11-17 MED ORDER — LIDOCAINE-PRILOCAINE 2.5-2.5 % EX CREA: 1.0000 | TOPICAL_CREAM | CUTANEOUS | Status: DC | PRN

## 2022-11-17 MED ORDER — PENTAFLUOROPROP-TETRAFLUOROETH EX AERO: 1.0000 | INHALATION_SPRAY | CUTANEOUS | Status: DC | PRN

## 2022-11-17 MED ORDER — LIDOCAINE HCL (PF) 1 % IJ SOLN: 5.0000 mL | INTRAMUSCULAR | Status: DC | PRN

## 2022-11-17 MED ORDER — CALCIUM GLUCONATE-NACL 1-0.675 GM/50ML-% IV SOLN
1.0000 g | Freq: Once | INTRAVENOUS | Status: AC
  Administered 2022-11-17: 1000 mg via INTRAVENOUS
  Filled 2022-11-17: qty 50

## 2022-11-17 NOTE — Assessment & Plan Note (Signed)
As evidenced by 12% weight loss over 6 months, severe muscle and fat depletion.

## 2022-11-17 NOTE — Progress Notes (Cosign Needed Addendum)
   Pt was seen by me today after call to CT with a 20 cc IV contrast extravasation into left upper arm. Site is near old dialysis fistula (no longer used) per notes  No redness No swelling NT No bleeding Skin intact Pt denies pain   Keep arm slightly elevated with ice pack to area for now  I will recheck in am

## 2022-11-17 NOTE — Progress Notes (Signed)
  Progress Note   Patient: [name] QPR:[id number] DOB: 04-09-1951 DOA: 11/16/2022     0 DOS: the patient was seen and examined on 11/17/2022 at 8:07AM      Brief hospital course: [name] is a 71 y.o. F with ESRD on HD, failred renal transplant, dCHF, CAD, chronic hypotension, MGUS, recurrent CVA with left sided weakness, Afib not on AC due to bleeding, and PVD, hx Cdiff, hx staph bacteremia who presented with stroke like symptoms.   Patient unable to relay symptoms, but staff reported she had new facial droop and worse weakness on left side.     12/4: Admitted, MRI brain negative 12/5: Noted to have hypocalcemia, respiratory alkalosis     Assessment and Plan: * Stroke-like symptoms Had similar presentation 1 month ago, that time with "seizure-like" symptoms.  Evaluated by Neurology with MRI brain, LTM EEG both of which were unrevealing and ultimately Neurology had "High suspicion that current presentation is likely toxic metabolic  encephalopathy"  Interestingly, at that time, only metabolic derangement of note was respiratory acidosis on VBG, same as today.  Respiratory alkalosis VBG shows pH >7.7, pCO2 19.  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 - Obtain CTA chest - Place on O2 - Consult Nephrology  Hypocalcemia - IV calcium - HD per Nephrology  Extravasation of intravenous contrast medium On 12/5 - Consult IR, appreciate cares  Cerebrovascular disease - Continue aspirin, atorvastatin, Plavix  Protein-calorie malnutrition, severe As evidenced by 12% weight loss over 6 months, severe muscle and fat depletion.  Hypotension - Continue midodrine  Chronic diastolic CHF (congestive heart failure) (HCC) Appears euvolemic to dehdyrated on exam. - Volume per HD  Paroxysmal atrial fibrillation ([place]) Not on AC.    ESRD on dialysis [facility]) - Consult Nephrology for routine HD          Subjective: Patient  coughing on breakfast, otherwise no complaints, feels at baseline.  No fever, no confusion, no dyspnea.  No focal weakness, numbness, or speech change.     Physical Exam: BP 138/61   Pulse 84   Temp 97.6 F (36.4 C) (Oral)   Resp 16   Wt 50.3 kg   SpO2 97%   BMI 20.28 kg/m   Cachectic adult female, lying in bed, interactive but chronically ill appearing and mentally slow RRR no murmurs, no LE edema RR normal, lungs without obvious rales or wheezing Abdomen soft, no grimace to palpiation, no guarding Attentive to my questions, affect blunted, moves upper extremites with normal strenth on the right, stable baseline weakness on the left.     Data Reviewed: Discussed with Nephrology BMP shows chronic renal afilure, normal bicarb VBG shows marked respiratory alkalosis CBC unremarkable    Family Communication: Sister by phone    Disposition: Status is: Observation The patient is here with stroke like symptoms likely recrudescence of prior stroke in the setting of alkalosis and hypocalcemia.    Will require work up of alkolosis, given severity, pH 7.7  If CTA chest reassuring, and Nephrology address alkalosis with HD today, possibly home tomorrow        Author: Edwin Dada, MD 11/17/2022 4:10 PM  For on call review [email or web address].

## 2022-11-17 NOTE — Assessment & Plan Note (Signed)
-   IV calcium - HD per Nephrology

## 2022-11-17 NOTE — Assessment & Plan Note (Signed)
Continue midodrine  

## 2022-11-17 NOTE — Progress Notes (Addendum)
Received patient in E.D bed, Awake,alert and oriented x 3 .Speech clear. Vitals stable,she is on room air saturating 97%  Access used : Left upper arm graft that works well during her treatment.  Duration of treatment: 3 hours.  Bolus : None  Fluid removed: 1 liter  HD treatment issue : None.  Hands off to the patient's nurse.

## 2022-11-17 NOTE — Hospital Course (Addendum)
[name] is a 71 y.o. F with ESRD on HD, failred renal transplant, dCHF, CAD, chronic hypotension, MGUS, recurrent CVA with left sided weakness, Afib not on AC due to bleeding, and PVD, hx Cdiff, hx staph bacteremia who presented with stroke like symptoms.   Patient unable to relay symptoms, but staff reported she had new facial droop and worse weakness on left side.     12/4: Admitted, MRI brain negative 12/5: Noted to have hypocalcemia, respiratory alkalosis

## 2022-11-17 NOTE — ED Notes (Signed)
Help get patient changed placed another brief got patient some warm blankets patient is resting with call bell in reach

## 2022-11-17 NOTE — Assessment & Plan Note (Addendum)
Appears euvolemic to dehdyrated on exam. - Volume per HD

## 2022-11-17 NOTE — Assessment & Plan Note (Signed)
Not on AC.

## 2022-11-17 NOTE — Assessment & Plan Note (Signed)
On 12/5 - Consult IR, appreciate cares

## 2022-11-17 NOTE — Progress Notes (Signed)
[facility] Progress Note   Subjective:  Seen in ED room - awaiting bed. L facial droop and L arm weakness are a little better today. She says she is tired, but denies dyspnea or pain. For HD today. Ionized Ca very low this AM, and pH very high? D/c sensipar and IV CaGluc ordered. RFP pending.  Objective Vitals:   11/17/22 0915 11/17/22 0930 11/17/22 0945 11/17/22 1106  BP: (!) 120/52 (!) 118/105 (!) 135/46   Pulse: 86 86 81   Resp: 19 (!) 22 17   Temp:    97.8 F (36.6 C)  TempSrc:    Axillary  SpO2: 96% 96% 96%   Weight:       Physical Exam General: Very frail, lethargic, but able to answer questions more clearly.  Heart: RRR; 2/6 murmur Lungs: CTA anteriorly Abdomen: soft  Extremities:no LE edema Dialysis Access: RUE AVG + bruit, old LUE AVF is no longer being used Neuro: L facial droop less, improved L grip strength  Additional Objective Labs: Basic Metabolic Panel: Recent Labs  Lab 11/16/22 1131 11/16/22 1135 11/17/22 0646 11/17/22 0652  NA 137 136 135 134*  K 4.5 4.4 4.6 4.7  CL 100 99  --   --   CO2 24  --   --   --   GLUCOSE 120* 117*  --   --   BUN 34* 35*  --   --   CREATININE 4.37* 4.60*  --   --   CALCIUM 9.6  --   --   --    Liver Function Tests: Recent Labs  Lab 11/16/22 1131  AST 22  ALT 15  ALKPHOS 91  BILITOT 1.4*  PROT 6.1*  ALBUMIN 3.2*   CBC: Recent Labs  Lab 11/16/22 1131 11/16/22 1135 11/17/22 0646 11/17/22 0652  WBC 8.2  --   --   --   NEUTROABS 5.3  --   --   --   HGB 9.8* 10.5* 9.9* 9.2*  HCT 31.7* 31.0* 29.0* 27.0*  MCV 105.7*  --   --   --   PLT 163  --   --   --    Studies/Results: EEG adult  Result Date: 11/16/2022 Spero Curb, MD     11/16/2022  9:53 PM [facility] Routine EEG Report Video Performed: Performed Demographics: Patient Name:   [name] Date of Birth:   May 16, 1951 Identification Number:   MRN - [id number] Study Times: Study  Start Time:   11/16/2022 16:00:00 Study End Time:   11/16/2022 16:35:00 Indication(s): Encephalopathy Technical Summary: This EEG was performed utilizing standard International 10-20 System of electrode placement. One channel electrocardiogram was monitored. Data were obtained, stored, and interpreted utilizing referential montage recording, with reformatting to longitudinal, transverse bipolar, and referential montages as necessary for interpretation. State(s):       Awake      Drowsy Activation Procedures: Hyperventilation: Not performed Photic Stimulation: Not performed EEG Description: The patient appeared somnolent, the background showed an absent of posterior dominant alpha rhythm. There were continuous of diffuse 4-5 Hz theta more than 3 Hz delta activity noted.  The amplitudes were 30uV.  There were no physiologic sleep structures observed.  There was no clinical event noted.  Throughout the record, there was no epileptiform abnormality or lateralizing sign observed.  Intermittent myogenic and movement artifacts were noted. Impression: This is an abnormal EEG indicative of a moderately severe diffuse encephalopathy. This is non-specific in etiology.  No epileptiform  abnormality, electrographic seizures or evidence of status epilepticus were present. No lateralizing sign is observed. Dr Spero Curb [facility] For Inpatient follow-up with [facility] physician please call [facility] [phone]. This is not an outpatient service. Post hospital discharge, please contact hospital directly.   DG Chest Port 1 View  Result Date: 11/16/2022 CLINICAL DATA:  Altered mental status. EXAM: PORTABLE CHEST 1 VIEW COMPARISON:  Chest radiograph dated 10/20/2022. FINDINGS: Background of chronic interstitial coarsening and bronchitic changes. No focal consolidation, pleural effusion, or pneumothorax. Stable cardiomegaly. Improved central vascular congestion since the prior radiograph. Atherosclerotic  calcification of the aorta. No acute osseous pathology. IMPRESSION: 1. No acute cardiopulmonary process. 2. Stable cardiomegaly. Electronically Signed   By: Anner Crete M.D.   On: 11/16/2022 19:27   MR BRAIN WO CONTRAST  Result Date: 11/16/2022 CLINICAL DATA:  Stroke suspected EXAM: MRI HEAD WITHOUT CONTRAST TECHNIQUE: Multiplanar, multiecho pulse sequences of the brain and surrounding structures were obtained without intravenous contrast. COMPARISON:  10/21/2022 MRI head, correlation is also made with CT head 11/16/2022 FINDINGS: Evaluation is somewhat limited by abnormal head position and motion. In addition, the exam could not be completed, and now axial T1 or coronal T2 sequence could be performed. Brain: No restricted diffusion to suggest acute or subacute infarct. No acute hemorrhage, mass, mass effect, midline shift. Advanced cerebral atrophy for age. Confluent T2 hyperintense signal in the periventricular white matter, likely the sequela of severe chronic small vessel ischemic disease. No hydrocephalus or extra-axial collection. Vascular: Limited but grossly normal arterial flow voids. Skull and upper cervical spine: Grossly normal marrow signal. Partially imaged ACDF. Sinuses/Orbits: Motion limited but no definite acute abnormality. Other: Fluid in the right mastoid air cells. IMPRESSION: Evaluation is limited by abnormal head position, motion artifact, and incomplete exam. Within this limitation, no evidence of acute or subacute infarct. Electronically Signed   By: Merilyn Baba M.D.   On: 11/16/2022 17:44   CT HEAD CODE STROKE WO CONTRAST  Result Date: 11/16/2022 CLINICAL DATA:  Code stroke. Neuro deficit, acute, stroke suspected. EXAM: CT HEAD WITHOUT CONTRAST TECHNIQUE: Contiguous axial images were obtained from the base of the skull through the vertex without intravenous contrast. RADIATION DOSE REDUCTION: This exam was performed according to the departmental dose-optimization program  which includes automated exposure control, adjustment of the mA and/or kV according to patient size and/or use of iterative reconstruction technique. COMPARISON:  Head CT 10/19/2022 and MRI 10/21/2022 FINDINGS: Brain: There is no evidence of an acute cortically based infarct, mass, midline shift, or extra-axial fluid collection. Confluent hypodensities in the cerebral white matter bilaterally are similar to the prior CT and are nonspecific but compatible with severe chronic small vessel ischemic disease. Chronic lacunar infarcts are again noted in the deep gray nuclei and pons. There is moderate cerebral atrophy. Vascular: Calcified atherosclerosis at the skull base. No hyperdense vessel. Skull: No acute fracture or suspicious osseous lesion. Sinuses/Orbits: The visualized paranasal sinuses are clear. Persistent moderate right and small left mastoid effusions. Bilateral cataract extraction. Other: None. ASPECTS ([facility] Stroke Program Early CT Score) - Ganglionic level infarction (caudate, lentiform nuclei, internal capsule, insula, M1-M3 cortex): 7 - Supraganglionic infarction (M4-M6 cortex): 3 Total score (0-10 with 10 being normal): 10 These results were communicated to Dr. Quinn Axe at 11:50 am on 11/16/2022 by text page via the [facility] messaging system. IMPRESSION: 1. No evidence of acute intracranial abnormality. 2. Severe chronic small vessel ischemic disease. Electronically Signed   By: Logan Bores M.D.   On: 11/16/2022 11:50  Medications:    stroke: early stages of recovery book   Does not apply Once   aspirin  81 mg Oral Daily   atorvastatin  80 mg Oral QHS   clopidogrel  75 mg Oral Daily   escitalopram  10 mg Oral q morning   feeding supplement (NEPRO CARB STEADY)  237 mL Oral BID BM   heparin  5,000 Units Subcutaneous Q8H   midodrine  10 mg Oral TID WC   pantoprazole  40 mg Oral Daily   sevelamer carbonate  800 mg Oral TID WC   sodium chloride flush  3 mL Intravenous Once   thiamine  100 mg  Oral Daily    Dialysis Orders: MWF at [facility] 4hr, 350/600, EDW 43.5kg, 2K/2Ca, UFP #2, AVG, 16g - Sensipar '30mg'$  PO q HD - Hectoral 47mg IV q HD - Mircera 320m Iv q 2 weeks (last 12/1)   Assessment/Plan:  L facial droop, L sided weakness: Not CVA on imaging, slightly improved today. Full work-up per neuro. ?Respiratory alkalosis: Awaiting chemistry results to compare bicarb levels. Looks like sched for CT angio to eval for PE.   ESRD:  Usual MWF schedule -> HD today, then will get back on schedule later this week.  Hypertension/volume: BP decent, no edema on exam.  Anemia: Hgb 11.5 -> 9.2 today, not due for ESA yet.  Metabolic bone disease: CorrCa slightly high initially - VDRA held, then ionized Ca low -> d/c sensipar and IV CaGluc given.   CAD/PAD   KaVeneta PentonPA-C 11/17/2022, 12:09 PM  [facility]

## 2022-11-17 NOTE — Assessment & Plan Note (Signed)
Had similar presentation 1 month ago, that time with "seizure-like" symptoms.  Evaluated by Neurology with MRI brain, LTM EEG both of which were unrevealing and ultimately Neurology had "High suspicion that current presentation is likely toxic metabolic  encephalopathy"  Interestingly, at that time, only metabolic derangement of note was respiratory acidosis on VBG, same as today.

## 2022-11-17 NOTE — Progress Notes (Signed)
CSW attempted to reach [place] at [facility] without success - a voicemail was left requesting a return call.  Madilyn Fireman, MSW, LCSW Transitions of Care  Clinical Social Worker II [phone]

## 2022-11-17 NOTE — Progress Notes (Signed)
OT Cancellation Note  Patient Details Name: [name] MRN: [id number] DOB: August 20, 1951   Cancelled Treatment:    Reason Eval/Treat Not Completed: Medical issues which prohibited therapy Pt still awaiting Chest CT to r/o PE pending IV placement. Will hold OT eval and follow up as schedule permits, likely tomorrow 12/6  Layla Maw 11/17/2022, 12:00 PM

## 2022-11-17 NOTE — Assessment & Plan Note (Signed)
-   Consult Nephrology for routine HD

## 2022-11-17 NOTE — Assessment & Plan Note (Signed)
-   Continue aspirin, atorvastatin, Plavix

## 2022-11-17 NOTE — Assessment & Plan Note (Signed)
VBG shows pH >7.7, pCO2 19.  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 - Obtain CTA chest - Place on O2 - Consult Nephrology

## 2022-11-17 NOTE — ED Notes (Signed)
Pt in room A&O x3. Updated on plan of care. Pt requesting to go home. Educated pt that at this time she has to remain in the ED. Pt tolerated breakfast and meds. Bilateral fistulas seen on assessment.

## 2022-11-17 NOTE — Progress Notes (Signed)
PT Cancellation Note  Patient Details Name: [name] MRN: [id number] DOB: 1951/04/19   Cancelled Treatment:    Reason Eval/Treat Not Completed: Other (comment)  Pt with CT chest ordered to r/o PE.  Will f/u at later time as able.  Abran Richard, PT Acute Rehab Services [facility] [phone]  Karlton Lemon 11/17/2022, 10:00 AM

## 2022-11-18 ENCOUNTER — Observation Stay (HOSPITAL_COMMUNITY): Payer: Medicare Other

## 2022-11-18 ENCOUNTER — Encounter (HOSPITAL_COMMUNITY): Payer: Self-pay | Admitting: Internal Medicine

## 2022-11-18 DIAGNOSIS — R2990 Unspecified symptoms and signs involving the nervous system: Secondary | ICD-10-CM | POA: Diagnosis not present

## 2022-11-18 LAB — BLOOD GAS, VENOUS
Acid-Base Excess: 10.8 mmol/L — ABNORMAL HIGH (ref 0.0–2.0)
Acid-Base Excess: 9.2 mmol/L — ABNORMAL HIGH (ref 0.0–2.0)
Bicarbonate: 35.1 mmol/L — ABNORMAL HIGH (ref 20.0–28.0)
Bicarbonate: 34.8 mmol/L — ABNORMAL HIGH (ref 20.0–28.0)
Drawn by: 164
Drawn by: 7512
O2 Saturation: 49 %
O2 Saturation: 36.4 %
Patient temperature: 37
Patient temperature: 37
pCO2, Ven: 44 mmHg (ref 44–60)
pCO2, Ven: 50 mmHg (ref 44–60)
pH, Ven: 7.51 — ABNORMAL HIGH (ref 7.25–7.43)
pH, Ven: 7.45 — ABNORMAL HIGH (ref 7.25–7.43)
pO2, Ven: <31 mmHg — CL (ref 32–45)
pO2, Ven: <31 mmHg — CL (ref 32–45)

## 2022-11-18 LAB — CBC
HCT: 27.2 % — ABNORMAL LOW (ref 36.0–46.0)
Hemoglobin: 9.3 g/dL — ABNORMAL LOW (ref 12.0–15.0)
MCH: 33.5 pg (ref 26.0–34.0)
MCHC: 34.2 g/dL (ref 30.0–36.0)
MCV: 97.8 fL (ref 80.0–100.0)
Platelets: 157 10*3/uL (ref 150–400)
RBC: 2.78 MIL/uL — ABNORMAL LOW (ref 3.87–5.11)
RDW: 18.9 % — ABNORMAL HIGH (ref 11.5–15.5)
WBC: 7.1 10*3/uL (ref 4.0–10.5)
nRBC: 0 % (ref 0.0–0.2)

## 2022-11-18 LAB — BASIC METABOLIC PANEL
Anion gap: 13 (ref 5–15)
BUN: 21 mg/dL (ref 8–23)
CO2: 27 mmol/L (ref 22–32)
Calcium: 9.3 mg/dL (ref 8.9–10.3)
Chloride: 95 mmol/L — ABNORMAL LOW (ref 98–111)
Creatinine, Ser: 2.99 mg/dL — ABNORMAL HIGH (ref 0.44–1.00)
GFR, Estimated: 16 mL/min — ABNORMAL LOW (ref >60)
Glucose, Bld: 95 mg/dL (ref 70–99)
Potassium: 3.8 mmol/L (ref 3.5–5.1)
Sodium: 135 mmol/L (ref 135–145)

## 2022-11-18 LAB — HEPATITIS B SURFACE ANTIBODY, QUANTITATIVE: Hep B S AB Quant (Post): >1000 m[IU]/mL (ref >9.9)

## 2022-11-18 MED ORDER — IOHEXOL 350 MG/ML SOLN
50.0000 mL | Freq: Once | INTRAVENOUS | Status: AC | PRN
  Administered 2022-11-18: 50 mL via INTRAVENOUS

## 2022-11-18 NOTE — Progress Notes (Signed)
PROGRESS NOTE    [name]  QMV:[id number] DOB: [birth year] DOA: 11/16/2022 PCP: Renford Dills, MD    Brief Narrative:   [name] is a 71 y.o. 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 difficile, history of staph bacteremia who presented to [facility] ED from SNF with mentation changes.  Patient unable to describe symptoms but SNF staff reported that she had new facial droop and worse weakness on the left side, slurred speech and right-sided gaze; and EMS was activated.  CBGs 200s, HR 106, O2 saturation 98% on room air.  Last HD was Friday.  No recent medication changes.  While in the ED, patient developed an episode of "foaming mouth"and drooling in which she was unable to talk lasting proxy 1 minutes associate with [name] and tachycardia.  Also with reported twitching like movement of bilateral arms.  1 dose of Ativan given and loaded on Keppra.  CT head negative for acute findings, WBC 10.0.  Neurology was consulted.  TRH consulted for further evaluation and management.  Assessment & Plan:   Strokelike symptoms From SNF with change in mentation, facial droop and weakness on the left side with associated slurred speech.  Similar presentation 1 month prior; that time with "seizure-like" symptoms.  Evaluated by neurology during that hospitalization with MR brain, LTM EEG both of which were unrevealing.  Only metabolic derangement of note was respiratory acidosis similar to this presentation.  CT head without contrast unrevealing.  MR brain negative.  Nephrology holding Sensipar which could cause some hypothalassemia.  Patient appears to be at her baseline.  No further workup at this time.  Respiratory alkalosis: Improved VBG on admission with pH greater than 7.7, pCO2 19.  CTA chest negative for  pulmonary embolism, suggestive of pulmonary artery hypertension, ascending thoracic aorta 4.0 cm, mild consolidation/collapse posterior right lower lobe, similar appearance peripheral tree-in-bud nodularity both upper/lower lobes consistent with atypical infection, small bilateral pleural effusions. --Ph 7.7>7.5; improved -- Unclear etiology, patient not tachypneic.  Unknown if associated with medications, Sensipar  Hypocalcemia Corrected calcium slightly high initially, ionized calcium low with alkalosis and Sensipar was held on admission.  Patient was given IV calcium gluconate, calcium now has normalized.  HLD: Atorvastatin 80 mg p.o. daily  History of CVA CAD/PAD -- Continue aspirin Plavix, statin  Chronic diastolic congestive heart failure, compensated -- Volume management per HD  Paroxysmal atrial fibrillation -- Not on anticoagulation outpatient  Depression/anxiety: Lexapro 10 mg p.o. daily  Orthostatic hypotension -- Midodrine 10 mg p.o. 3 times daily  GERD: Protonix 40 g p.o. daily  Severe protein calorie malnutrition As evidenced by 12% weight loss over 6 months, severe muscle and fat depletion -- Continue to encourage increased oral intake  Weakness/debility/deconditioning: From [facility]. --[facility] consulted for discharge planning    DVT prophylaxis: heparin injection 5,000 Units Start: 11/16/22 1630    Code Status: DNR Family Communication: No family present at bedside this morning  Disposition Plan:  Level of care: Telemetry Medical Status is: Observation The patient remains OBS appropriate and will d/c before 2 midnights.    Consultants:  Neurology Note neurology  Procedures:  None  Antimicrobials:  None   Subjective: Patient seen examined bedside, resting comfortably.  Sitting in bedside chair.  No complaints this morning.  Feels like she is at her baseline.  pH improved.  Unclear etiology of respiratory alkalosis.  Patient is afebrile  without leukocytosis.  Discussed with social work, they will see if she can return to SNF today or tomorrow, unclear if she is long-term care there.  No other questions or concerns at this time.  Denies headache, no dizziness, no chest pain, no palpitations, no shortness of breath, no abdominal pain, no focal weakness, no fever/chills/night sweats, no nausea/vomiting/diarrhea, no paresthesias.  No acute events overnight per nursing staff.  Objective: Vitals:   11/18/22 0245 11/18/22 0325 11/18/22 0751 11/18/22 1130  BP:  107/62 (!) 112/51 113/77  Pulse:  80 84 84  Resp:  16 18 18   Temp:  98.2 F (36.8 C) (!) 97.5 F (36.4 C) 97.7 F (36.5 C)  TempSrc:  Oral Oral Oral  SpO2:  95% 97% 96%  Weight: 42.8 kg     Height: 5' 2.5" (1.588 m)       Intake/Output Summary (Last 24 hours) at 11/18/2022 1638 Last data filed at 11/18/2022 0900 Gross per 24 hour  Intake 436 ml  Output 1000 ml  Net -564 ml   Filed Weights   11/17/22 1505 11/17/22 1839 11/18/22 0245  Weight: 50.3 kg 49.8 kg 42.8 kg    Examination:  Physical Exam: GEN: NAD, alert and oriented x 3, chronically ill in appearance HEENT: NCAT, PERRL, EOMI, sclera clear, MMM PULM: CTAB w/o wheezes/crackles, normal respiratory effort, on room air CV: RRR w/o M/G/R GI: abd soft, NTND, NABS, no R/G/M MSK: no peripheral edema, moves all, independently NEURO: CN II-XII intact, no focal deficits, sensation to light touch intact PSYCH: normal mood/affect Integumentary: dry/intact, no rashes or wounds    Data Reviewed: I have personally reviewed following labs and imaging studies  CBC: Recent Labs  Lab 11/16/22 1131 11/16/22 1135 11/17/22 0646 11/17/22 0652 11/18/22 0315  WBC 8.2  --   --   --  7.1  NEUTROABS 5.3  --   --   --   --   HGB 9.8* 10.5* 9.9* 9.2* 9.3*  HCT 31.7* 31.0* 29.0* 27.0* 27.2*  MCV 105.7*  --   --   --  97.8  PLT 163  --   --   --  157   Basic Metabolic Panel: Recent Labs  Lab 11/16/22 1131  11/16/22 1135 11/17/22 0646 11/17/22 0652 11/18/22 0315  NA 137 136 135 134* 135  K 4.5 4.4 4.6 4.7 3.8  CL 100 99  --   --  95*  CO2 24  --   --   --  27  GLUCOSE 120* 117*  --   --  95  BUN 34* 35*  --   --  21  CREATININE 4.37* 4.60*  --   --  2.99*  CALCIUM 9.6  --   --   --  9.3   GFR: Estimated Creatinine Clearance: 11.5 mL/min (A) (by C-G formula based on SCr of 2.99 mg/dL (H)). Liver Function Tests: Recent Labs  Lab 11/16/22 1131  AST 22  ALT 15  ALKPHOS 91  BILITOT 1.4*  PROT 6.1*  ALBUMIN 3.2*   No results for input(s): "LIPASE", "AMYLASE" in the last 168 hours. Recent Labs  Lab 11/17/22 0641  AMMONIA 24   Coagulation Profile: Recent Labs  Lab 11/17/22 0641  INR 1.2   Cardiac Enzymes: No results for input(s): "CKTOTAL", "CKMB", "CKMBINDEX", "TROPONINI" in the last 168 hours. BNP (last 3 results) No results for input(s): "PROBNP" in the last 8760 hours.  HbA1C: No results for input(s): "HGBA1C" in the last 72 hours. CBG: Recent Labs  Lab 11/16/22 1129  GLUCAP 122*   Lipid Profile: Recent Labs    11/17/22 0500  CHOL 139  HDL 55  LDLCALC 61  TRIG 114  CHOLHDL 2.5   Thyroid Function Tests: Recent Labs    11/17/22 0641  TSH 5.999*  FREET4 1.01   Anemia Panel: No results for input(s): "VITAMINB12", "FOLATE", "FERRITIN", "TIBC", "IRON", "RETICCTPCT" in the last 72 hours. Sepsis Labs: No results for input(s): "PROCALCITON", "LATICACIDVEN" in the last 168 hours.  No results found for this or any previous visit (from the past 240 hour(s)).       Radiology Studies: CT Angio Chest Pulmonary Embolism (PE) W or WO Contrast  Result Date: 11/18/2022 CLINICAL DATA:  Shortness of breath.  Pulmonary embolism suspected. EXAM: CT ANGIOGRAPHY CHEST WITH CONTRAST TECHNIQUE: Multidetector CT imaging of the chest was performed using the standard protocol during bolus administration of intravenous contrast. Multiplanar CT image reconstructions and MIPs  were obtained to evaluate the vascular anatomy. RADIATION DOSE REDUCTION: This exam was performed according to the departmental dose-optimization program which includes automated exposure control, adjustment of the mA and/or kV according to patient size and/or use of iterative reconstruction technique. CONTRAST:  50mL OMNIPAQUE IOHEXOL 350 MG/ML SOLN COMPARISON:  09/10/2022 FINDINGS: Cardiovascular: The heart size is normal. The heart is enlarged. No substantial pericardial effusion. Coronary artery calcification is evident. Ascending thoracic aorta measures 4 cm diameter Enlargement of the pulmonary outflow tract/main pulmonary arteries suggests pulmonary arterial hypertension. There is no filling defect within the opacified pulmonary arteries to suggest the presence of an acute pulmonary embolus. Mediastinum/Nodes: Enlarged right paratracheal node measured previously at 14 mm is now 11 mm on image 41/5. There is no hilar lymphadenopathy. The esophagus has normal imaging features. There is no axillary lymphadenopathy. Lungs/Pleura: Bronchial wall thickening with peripheral micro nodularity in both upper and lower lobes is similar to prior in some of these tiny nodules appear calcified. No new overtly suspicious pulmonary nodule or mass. Diffuse bronchial wall thickening evident. Mild collapse/consolidation in the posterior right lower lobe is new in the interval. Small bilateral pleural effusions again noted. Upper Abdomen: Reflux of contrast material into the hepatic veins is compatible with right heart dysfunction. Extensive vascular calcification noted in the upper abdomen. Musculoskeletal: No worrisome lytic or sclerotic osseous abnormality. Diffuse body wall edema evident. Review of the MIP images confirms the above findings. IMPRESSION: 1. No CT evidence for acute pulmonary embolus. 2. Enlargement of the pulmonary outflow tract/main pulmonary arteries suggests pulmonary arterial hypertension. 3. Ascending  thoracic aorta measures 4.0 cm diameter. Recommend annual imaging followup by CTA or MRA. This recommendation follows 2010 ACCF/AHA/AATS/ACR/ASA/SCA/SCAI/SIR/STS/SVM Guidelines for the Diagnosis and Management of Patients with Thoracic Aortic Disease. Circulation. 2010; 121: W098-J191. Aortic aneurysm NOS (ICD10-I71.9) 4. Mild collapse/consolidation in the posterior right lower lobe, new in the interval. 5. Similar appearance of the peripheral tree-in-bud nodularity involving both upper and lower lobes. Findings are likely related to sequelae of atypical infection. 6. Small bilateral pleural effusions, similar to prior. 7. Diffuse body wall edema. Electronically Signed   By: Kennith Center M.D.   On: 11/18/2022 07:42   EEG adult  Result Date: 11/16/2022 Reece Levy, MD     11/16/2022  9:53 PM [facility] Routine EEG Report Video Performed: Performed Demographics: Patient Name:   [name] Date of Birth:   1951/02/18 Identification Number:   MRN - [id number]  Study Times: Study Start Time:   11/16/2022 16:00:00 Study End Time:   11/16/2022 16:35:00 Indication(s): Encephalopathy Technical Summary: This EEG was performed utilizing standard International 10-20 System of electrode placement. One channel electrocardiogram was monitored. Data were obtained, stored, and interpreted utilizing referential montage recording, with reformatting to longitudinal, transverse bipolar, and referential montages as necessary for interpretation. State(s):       Awake      Drowsy Activation Procedures: Hyperventilation: Not performed Photic Stimulation: Not performed EEG Description: The patient appeared somnolent, the background showed an absent of posterior dominant alpha rhythm. There were continuous of diffuse 4-5 Hz theta more than 3 Hz delta activity noted.  The amplitudes were 30uV.  There were no physiologic sleep structures observed.  There was no clinical event  noted.  Throughout the record, there was no epileptiform abnormality or lateralizing sign observed.  Intermittent myogenic and movement artifacts were noted. Impression: This is an abnormal EEG indicative of a moderately severe diffuse encephalopathy. This is non-specific in etiology.  No epileptiform abnormality, electrographic seizures or evidence of status epilepticus were present. No lateralizing sign is observed. Dr Reece Levy [facility] For Inpatient follow-up with [facility] physician please call [facility] [phone]. This is not an outpatient service. Post hospital discharge, please contact hospital directly.   DG Chest Port 1 View  Result Date: 11/16/2022 CLINICAL DATA:  Altered mental status. EXAM: PORTABLE CHEST 1 VIEW COMPARISON:  Chest radiograph dated 10/20/2022. FINDINGS: Background of chronic interstitial coarsening and bronchitic changes. No focal consolidation, pleural effusion, or pneumothorax. Stable cardiomegaly. Improved central vascular congestion since the prior radiograph. Atherosclerotic calcification of the aorta. No acute osseous pathology. IMPRESSION: 1. No acute cardiopulmonary process. 2. Stable cardiomegaly. Electronically Signed   By: Elgie Collard M.D.   On: 11/16/2022 19:27   MR BRAIN WO CONTRAST  Result Date: 11/16/2022 CLINICAL DATA:  Stroke suspected EXAM: MRI HEAD WITHOUT CONTRAST TECHNIQUE: Multiplanar, multiecho pulse sequences of the brain and surrounding structures were obtained without intravenous contrast. COMPARISON:  10/21/2022 MRI head, correlation is also made with CT head 11/16/2022 FINDINGS: Evaluation is somewhat limited by abnormal head position and motion. In addition, the exam could not be completed, and now axial T1 or coronal T2 sequence could be performed. Brain: No restricted diffusion to suggest acute or subacute infarct. No acute hemorrhage, mass, mass effect, midline shift. Advanced cerebral atrophy for age. Confluent T2  hyperintense signal in the periventricular white matter, likely the sequela of severe chronic small vessel ischemic disease. No hydrocephalus or extra-axial collection. Vascular: Limited but grossly normal arterial flow voids. Skull and upper cervical spine: Grossly normal marrow signal. Partially imaged ACDF. Sinuses/Orbits: Motion limited but no definite acute abnormality. Other: Fluid in the right mastoid air cells. IMPRESSION: Evaluation is limited by abnormal head position, motion artifact, and incomplete exam. Within this limitation, no evidence of acute or subacute infarct. Electronically Signed   By: Wiliam Ke M.D.   On: 11/16/2022 17:44        Scheduled Meds:   stroke: early stages of recovery book   Does not apply Once   aspirin  81 mg Oral Daily   atorvastatin  80 mg Oral QHS   clopidogrel  75 mg Oral Daily   escitalopram  10 mg Oral q morning   feeding supplement (NEPRO CARB STEADY)  237 mL Oral BID BM   heparin  5,000 Units Subcutaneous Q8H   midodrine  10 mg Oral TID WC   pantoprazole  40 mg Oral Daily  sevelamer carbonate  800 mg Oral TID WC   sodium chloride flush  3 mL Intravenous Once   thiamine  100 mg Oral Daily   Continuous Infusions:   LOS: 0 days    Time spent: 46 minutes spent on chart review, discussion with nursing staff, consultants, updating family and interview/physical exam; more than 50% of that time was spent in counseling and/or coordination of care.    Alvira Philips [place], DO [facility] Available via Epic secure chat 7am-7pm After these hours, please refer to coverage provider listed on [email or web address] 11/18/2022, 4:38 PM

## 2022-11-18 NOTE — Progress Notes (Signed)
A. Doutova  did return my call and said to reach out to the attending MD, and I had already left a message and he responded by saying he and already seen results and is not worried about PO2, and PH is improving.

## 2022-11-18 NOTE — Evaluation (Signed)
Occupational Therapy Evaluation Patient Details Name: [name] MRN: [id number] DOB: [birth year] Today's Date: 11/18/2022   History of Present Illness Pt is a 71 y/o F sent from HD center for evaluation of strokelike symptoms. 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   Clinical Impression   Pt supine on arrival with leg between opening in bed rail, requiring max A for problem solving to get leg out of opening. Pt following all simple one step commands with increased time and up to mod cues. Pt with decreased attention to L side throughout session. Pt performing bed mobility with mod A and STS transfers with max A. Pt with poor skill to perform visual tracking on L upper and lower quadrants as well as R lower quadrant. Pt with decreased coordination, strength, ROM, safety, awareness, balance, and cognition. Recommending SNF to optimize safety and independence in ADL and IADL.      Recommendations for follow up therapy are one component of a multi-disciplinary discharge planning process, led by the attending physician.  Recommendations may be updated based on patient status, additional functional criteria and insurance authorization.   Follow Up Recommendations  Skilled nursing-short term rehab (<3 hours/day)     Assistance Recommended at Discharge Frequent or constant Supervision/Assistance  Patient can return home with the following Two people to help with walking and/or transfers;Two people to help with bathing/dressing/bathroom;Assistance with cooking/housework;Assistance with feeding;Direct supervision/assist for medications management;Direct supervision/assist for financial management;Assist for  transportation;Help with stairs or ramp for entrance    Functional Status Assessment  Patient has had a recent decline in their functional status and demonstrates the ability to make significant improvements in function in a reasonable and predictable amount of time.  Equipment Recommendations  Other (comment) (TBD next venue)    Recommendations for Other Services       Precautions / Restrictions Precautions Precautions: Fall Restrictions Weight Bearing Restrictions: No      Mobility Bed Mobility Overal bed mobility: Needs Assistance Bed Mobility: Rolling, Supine to Sit, Sit to Supine Rolling: Mod assist   Supine to sit: Mod assist Sit to supine: Min assist   General bed mobility comments: Pt required mod cues for sequencing/technique and relied heavily on bedrails. Upon sitting EOB, pt with L lateral lean, requiring mod A to sustain balance    Transfers Overall transfer level: Needs assistance Equipment used: 1 person hand held assist Transfers: Bed to chair/wheelchair/BSC, Sit to/from Stand Sit to Stand: Max assist          Lateral/Scoot Transfers: Max assist General transfer comment: Max A for STS, but pt unable to achieve full standing position. Scooting toward HOB with max A. Difficulty sequencing despite max cues and placement of BUE for set-up.      Balance Overall balance assessment: Needs assistance Sitting-balance support: Bilateral upper extremity supported, Feet supported Sitting balance-Leahy Scale: Poor Sitting balance - Comments: pt with L lateral lean, requiring mod A to maintain static sitting balance. Postural control: Left lateral lean Standing balance support: Bilateral upper extremity supported Standing balance-Leahy Scale: Zero Standing balance comment: max A                           ADL either performed or assessed  with clinical judgement   ADL Overall ADL's : Needs assistance/impaired Eating/Feeding: Moderate  assistance;Sitting Eating/Feeding Details (indicate cue type and reason): Mod A for sitting balance while self-feeding. Pt required cues for safety to take adequately sized bites Grooming: Moderate assistance;Sitting;Maximal assistance   Upper Body Bathing: Sitting;Maximal assistance   Lower Body Bathing: Moderate assistance;Bed level   Upper Body Dressing : Sitting;Moderate assistance   Lower Body Dressing: Moderate assistance;Bed level Lower Body Dressing Details (indicate cue type and reason): Mod A to don socks Toilet Transfer: Maximal assistance Toilet Transfer Details (indicate cue type and reason): STS         Functional mobility during ADLs: Maximal assistance;Rolling walker (2 wheels) General ADL Comments: STS only this session     Vision Patient Visual Report: No change from baseline Vision Assessment?: Vision impaired- to be further tested in functional context Additional Comments: no overt overshooting or undershooting, however, unable to track toward L at all. Initially tracking to R, but seemed to get "stuck on R.     Perception Perception Perception Tested?: No   Praxis Praxis Praxis tested?: Not tested    Pertinent Vitals/Pain Pain Assessment Pain Assessment: 0-10 Faces Pain Scale: Hurts even more Pain Location: "all over" Pain Descriptors / Indicators: Discomfort Pain Intervention(s): Limited activity within patient's tolerance, Monitored during session, Repositioned     Hand Dominance Right   Extremity/Trunk Assessment Upper Extremity Assessment Upper Extremity Assessment: LUE deficits/detail;RUE deficits/detail RUE Deficits / Details: Decreased strength and coordination; ROM WFL for tasks assessed. LUE Deficits / Details: Unable to reach head with LUE; difficulty with motor planning; required A to release grasp on gown despite attemtping to follow command to do so.   Lower Extremity Assessment Lower Extremity Assessment: Defer to PT evaluation    Cervical / Trunk Assessment Cervical / Trunk Assessment: Kyphotic   Communication Communication Communication: No difficulties;Other (comment) (poor historian/confused)   Cognition Arousal/Alertness: Awake/alert Behavior During Therapy: WFL for tasks assessed/performed Overall Cognitive Status: No family/caregiver present to determine baseline cognitive functioning                                 General Comments: pt poor historian and confused. Following ~90% of one step commands; requriing cues for sequencing throughout. Poor safety awareness, attention to L side of body and environment.     General Comments       Exercises     Shoulder Instructions      Home Living Family/patient expects to be discharged to:: Skilled nursing facility                                 Additional Comments: pt is a poor historian. Initally telling therapist she lives with her mother, then stating she lives at a facility across the street from the hospital but unsure how long she has been there      Prior Functioning/Environment Prior Level of Function : Patient poor historian/Family not available;Needs assist         Mobility (physical): Transfers;Gait ADLs (physical): Bathing Mobility Comments: pt reports using rollator at baseline ADLs Comments: pt reports having an aide at home that comes daily that helps her wash up        OT Problem List: Decreased strength;Decreased activity tolerance;Impaired balance (sitting and/or standing);Decreased safety awareness;Decreased cognition;Decreased coordination;Impaired vision/perception;Decreased knowledge of use of DME or AE;Impaired UE functional use  OT Treatment/Interventions: Self-care/ADL training;Therapeutic exercise;DME and/or AE instruction;Therapeutic activities;Cognitive remediation/compensation;Visual/perceptual remediation/compensation;Patient/family education;Balance training    OT Goals(Current  goals can be found in the care plan section) Acute Rehab OT Goals Patient Stated Goal: eat puree pineapple OT Goal Formulation: With patient Time For Goal Achievement: 12/02/22 Potential to Achieve Goals: Good  OT Frequency: Min 2X/week    Co-evaluation              AM-PAC OT "6 Clicks" Daily Activity     Outcome Measure Help from another person eating meals?: A Little Help from another person taking care of personal grooming?: A Lot Help from another person toileting, which includes using toliet, bedpan, or urinal?: A Lot Help from another person bathing (including washing, rinsing, drying)?: A Lot Help from another person to put on and taking off regular upper body clothing?: A Lot Help from another person to put on and taking off regular lower body clothing?: A Lot 6 Click Score: 13   End of Session Equipment Utilized During Treatment: Gait belt Nurse Communication: Mobility status  Activity Tolerance: Patient tolerated treatment well Patient left: in bed;with call bell/phone within reach;with bed alarm set  OT Visit Diagnosis: Unsteadiness on feet (R26.81);Muscle weakness (generalized) (M62.81);Pain;Other symptoms and signs involving cognitive function;Other abnormalities of gait and mobility (R26.89);Low vision, both eyes (H54.2);Feeding difficulties (R63.3) Pain - Right/Left: Left Pain - part of body: Leg (generalized)                Time: 3094-0768 OT Time Calculation (min): 27 min Charges:  OT General Charges $OT Visit: 1 Visit OT Evaluation $OT Eval Moderate Complexity: 1 Mod OT Treatments $Self Care/Home Management : 8-22 mins  Elder Cyphers, OTR/L [facility] Acute Rehabilitation Office: [phone]    Magnus Ivan 11/18/2022, 3:27 PM

## 2022-11-18 NOTE — Plan of Care (Signed)
  Problem: Coping: Goal: Will verbalize positive feelings about self Outcome: Progressing   Problem: Pain Managment: Goal: General experience of comfort will improve Outcome: Progressing   Problem: Safety: Goal: Ability to remain free from injury will improve Outcome: Progressing

## 2022-11-18 NOTE — Progress Notes (Signed)
[facility] Progress Note   Subjective:  Seen in room - no new issues.  Denies dyspnea, edema.  CTA neg PE but some upper lobe atypical infection findings.   Objective Vitals:   11/18/22 0245 11/18/22 0325 11/18/22 0751 11/18/22 1130  BP:  107/62 (!) 112/51 113/77  Pulse:  80 84 84  Resp:  '16 18 18  '$ Temp:  98.2 F (36.8 C) (!) 97.5 F (36.4 C) 97.7 F (36.5 C)  TempSrc:  Oral Oral Oral  SpO2:  95% 97% 96%  Weight: 42.8 kg     Height: 5' 2.5" (1.588 m)      Physical Exam General: Very frail, lethargic, but able to answer questions more clearly.  Heart: RRR; 2/6 murmur Lungs: CTA anteriorly, normal RR Abdomen: soft  Extremities:no LE edema Dialysis Access: RUE AVG + bruit, old LUE AVF is no longer being used Neuro: L facial droop less, still weak L grip strength  Additional Objective Labs: Basic Metabolic Panel: Recent Labs  Lab 11/16/22 1131 11/16/22 1135 11/17/22 0646 11/17/22 0652 11/18/22 0315  NA 137 136 135 134* 135  K 4.5 4.4 4.6 4.7 3.8  CL 100 99  --   --  95*  CO2 24  --   --   --  27  GLUCOSE 120* 117*  --   --  95  BUN 34* 35*  --   --  21  CREATININE 4.37* 4.60*  --   --  2.99*  CALCIUM 9.6  --   --   --  9.3    Liver Function Tests: Recent Labs  Lab 11/16/22 1131  AST 22  ALT 15  ALKPHOS 91  BILITOT 1.4*  PROT 6.1*  ALBUMIN 3.2*    CBC: Recent Labs  Lab 11/16/22 1131 11/16/22 1135 11/17/22 0646 11/17/22 0652 11/18/22 0315  WBC 8.2  --   --   --  7.1  NEUTROABS 5.3  --   --   --   --   HGB 9.8*   < > 9.9* 9.2* 9.3*  HCT 31.7*   < > 29.0* 27.0* 27.2*  MCV 105.7*  --   --   --  97.8  PLT 163  --   --   --  157   < > = values in this interval not displayed.    Studies/Results: CT Angio Chest Pulmonary Embolism (PE) W or WO Contrast  Result Date: 11/18/2022 CLINICAL DATA:  Shortness of breath.  Pulmonary embolism suspected. EXAM: CT ANGIOGRAPHY CHEST WITH CONTRAST TECHNIQUE: Multidetector CT imaging of the chest  was performed using the standard protocol during bolus administration of intravenous contrast. Multiplanar CT image reconstructions and MIPs were obtained to evaluate the vascular anatomy. RADIATION DOSE REDUCTION: This exam was performed according to the departmental dose-optimization program which includes automated exposure control, adjustment of the mA and/or kV according to patient size and/or use of iterative reconstruction technique. CONTRAST:  49m OMNIPAQUE IOHEXOL 350 MG/ML SOLN COMPARISON:  09/10/2022 FINDINGS: Cardiovascular: The heart size is normal. The heart is enlarged. No substantial pericardial effusion. Coronary artery calcification is evident. Ascending thoracic aorta measures 4 cm diameter Enlargement of the pulmonary outflow tract/main pulmonary arteries suggests pulmonary arterial hypertension. There is no filling defect within the opacified pulmonary arteries to suggest the presence of an acute pulmonary embolus. Mediastinum/Nodes: Enlarged right paratracheal node measured previously at 14 mm is now 11 mm on image 41/5. There is no hilar lymphadenopathy. The esophagus has normal imaging features. There  is no axillary lymphadenopathy. Lungs/Pleura: Bronchial wall thickening with peripheral micro nodularity in both upper and lower lobes is similar to prior in some of these tiny nodules appear calcified. No new overtly suspicious pulmonary nodule or mass. Diffuse bronchial wall thickening evident. Mild collapse/consolidation in the posterior right lower lobe is new in the interval. Small bilateral pleural effusions again noted. Upper Abdomen: Reflux of contrast material into the hepatic veins is compatible with right heart dysfunction. Extensive vascular calcification noted in the upper abdomen. Musculoskeletal: No worrisome lytic or sclerotic osseous abnormality. Diffuse body wall edema evident. Review of the MIP images confirms the above findings. IMPRESSION: 1. No CT evidence for acute  pulmonary embolus. 2. Enlargement of the pulmonary outflow tract/main pulmonary arteries suggests pulmonary arterial hypertension. 3. Ascending thoracic aorta measures 4.0 cm diameter. Recommend annual imaging followup by CTA or MRA. This recommendation follows 2010 ACCF/AHA/AATS/ACR/ASA/SCA/SCAI/SIR/STS/SVM Guidelines for the Diagnosis and Management of Patients with Thoracic Aortic Disease. Circulation. 2010; 121: A309-M076. Aortic aneurysm NOS (ICD10-I71.9) 4. Mild collapse/consolidation in the posterior right lower lobe, new in the interval. 5. Similar appearance of the peripheral tree-in-bud nodularity involving both upper and lower lobes. Findings are likely related to sequelae of atypical infection. 6. Small bilateral pleural effusions, similar to prior. 7. Diffuse body wall edema. Electronically Signed   By: Misty Stanley M.D.   On: 11/18/2022 07:42   EEG adult  Result Date: 11/16/2022 Spero Curb, MD     11/16/2022  9:53 PM [facility] Routine EEG Report Video Performed: Performed Demographics: Patient Name:   [name] Date of Birth:   June 04, 1951 Identification Number:   MRN - [id number] Study Times: Study Start Time:   11/16/2022 16:00:00 Study End Time:   11/16/2022 16:35:00 Indication(s): Encephalopathy Technical Summary: This EEG was performed utilizing standard International 10-20 System of electrode placement. One channel electrocardiogram was monitored. Data were obtained, stored, and interpreted utilizing referential montage recording, with reformatting to longitudinal, transverse bipolar, and referential montages as necessary for interpretation. State(s):       Awake      Drowsy Activation Procedures: Hyperventilation: Not performed Photic Stimulation: Not performed EEG Description: The patient appeared somnolent, the background showed an absent of posterior dominant alpha rhythm. There were continuous of diffuse 4-5 Hz theta more  than 3 Hz delta activity noted.  The amplitudes were 30uV.  There were no physiologic sleep structures observed.  There was no clinical event noted.  Throughout the record, there was no epileptiform abnormality or lateralizing sign observed.  Intermittent myogenic and movement artifacts were noted. Impression: This is an abnormal EEG indicative of a moderately severe diffuse encephalopathy. This is non-specific in etiology.  No epileptiform abnormality, electrographic seizures or evidence of status epilepticus were present. No lateralizing sign is observed. Dr Spero Curb [facility] For Inpatient follow-up with [facility] physician please call [facility] [phone]. This is not an outpatient service. Post hospital discharge, please contact hospital directly.   DG Chest Port 1 View  Result Date: 11/16/2022 CLINICAL DATA:  Altered mental status. EXAM: PORTABLE CHEST 1 VIEW COMPARISON:  Chest radiograph dated 10/20/2022. FINDINGS: Background of chronic interstitial coarsening and bronchitic changes. No focal consolidation, pleural effusion, or pneumothorax. Stable cardiomegaly. Improved central vascular congestion since the prior radiograph. Atherosclerotic calcification of the aorta. No acute osseous pathology. IMPRESSION: 1. No acute cardiopulmonary process. 2. Stable cardiomegaly. Electronically Signed   By: Anner Crete M.D.   On: 11/16/2022 19:27   MR BRAIN WO CONTRAST  Result Date: 11/16/2022  CLINICAL DATA:  Stroke suspected EXAM: MRI HEAD WITHOUT CONTRAST TECHNIQUE: Multiplanar, multiecho pulse sequences of the brain and surrounding structures were obtained without intravenous contrast. COMPARISON:  10/21/2022 MRI head, correlation is also made with CT head 11/16/2022 FINDINGS: Evaluation is somewhat limited by abnormal head position and motion. In addition, the exam could not be completed, and now axial T1 or coronal T2 sequence could be performed. Brain: No restricted diffusion to  suggest acute or subacute infarct. No acute hemorrhage, mass, mass effect, midline shift. Advanced cerebral atrophy for age. Confluent T2 hyperintense signal in the periventricular white matter, likely the sequela of severe chronic small vessel ischemic disease. No hydrocephalus or extra-axial collection. Vascular: Limited but grossly normal arterial flow voids. Skull and upper cervical spine: Grossly normal marrow signal. Partially imaged ACDF. Sinuses/Orbits: Motion limited but no definite acute abnormality. Other: Fluid in the right mastoid air cells. IMPRESSION: Evaluation is limited by abnormal head position, motion artifact, and incomplete exam. Within this limitation, no evidence of acute or subacute infarct. Electronically Signed   By: Merilyn Baba M.D.   On: 11/16/2022 17:44    Medications:    stroke: early stages of recovery book   Does not apply Once   aspirin  81 mg Oral Daily   atorvastatin  80 mg Oral QHS   clopidogrel  75 mg Oral Daily   escitalopram  10 mg Oral q morning   feeding supplement (NEPRO CARB STEADY)  237 mL Oral BID BM   heparin  5,000 Units Subcutaneous Q8H   midodrine  10 mg Oral TID WC   pantoprazole  40 mg Oral Daily   sevelamer carbonate  800 mg Oral TID WC   sodium chloride flush  3 mL Intravenous Once   thiamine  100 mg Oral Daily    Dialysis Orders: MWF at [facility] 4hr, 350/600, EDW 43.5kg, 2K/2Ca, UFP #2, AVG, 16g - Sensipar '30mg'$  PO q HD - Hectoral 65mg IV q HD - Mircera 339m Iv q 2 weeks (last 12/1)   Assessment/Plan:  L facial droop, L sided weakness: Not CVA on imaging, slightly improved today. Full work-up per neuro. Respiratory alkalosis:  unclear etiology - VBG this AM improving.  CTA neg PE. Serum bicarb 27.   ESRD:  Usual MWF schedule - had HD 12/5 make up that ended in PM - will do HD today to resume usual schedule.   Hypertension/volume: BP decent, no edema on exam.  Anemia: Hgb 11.5 -> 9s, no active bleeding, not due for ESA yet.  Metabolic  bone disease: CorrCa slightly high initially - VDRA held, then ionized Ca low with alkalosis > d/c sensipar and IV CaGluc given. Ca normal now.  Can resume sensipar at d/c.  CAD/PAD Ok with d/c today if primary planning.   LiJannifer HickD [facility] Pager [phone]

## 2022-11-18 NOTE — Progress Notes (Signed)
Just received call from lab [facility]) that PO2 venous blood gas is less than 31.  Paging MD to notify.

## 2022-11-18 NOTE — Progress Notes (Signed)
Paged Dr  A. Doutova to report critical result.  Did ask for a call back if not the right MD to notify and to let me know who I would need to call or page.

## 2022-11-18 NOTE — Evaluation (Signed)
Physical Therapy Evaluation Patient Details Name: [name] MRN: [id number] DOB: 05-15-1951 Today's Date: 11/18/2022  History of Present Illness  Pt is a 71 y/o F sent from HD center for evaluation of strokelike symptoms. 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  Clinical Impression  Received pt supine in bed and agreeable to PT evaluation. Pt poor historian and confused; therefore unable to provide accurate subjective intake, but did state that she came from SNF across the street and wishes to return there. Pt required mod A for supine<>sitting EOB and mod A for static sitting balance due to R lateral lean. Pt politely refused to attempt standing stating "I can't, I need to lie back down" due to soreness "all over" and immediately began to lie down - required min A for sit<>supine for LE management. Scooted to [facility] with total A using Trendelenburg bed position as pt unable to sequence to assist in scooting to [facility]. Recommend SNF at this time due to current impairments. Acute PT to cont to follow.      Recommendations for follow up therapy are one component of a multi-disciplinary discharge planning process, led by the attending physician.  Recommendations may be updated based on patient status, additional functional criteria and insurance authorization.  Follow Up Recommendations Skilled nursing-short term rehab (<3 hours/day) Can patient physically be transported by private vehicle: No    Assistance Recommended at Discharge Frequent or constant Supervision/Assistance  Patient can return home with the following  A lot of help with walking and/or transfers;A lot of help with  bathing/dressing/bathroom;Assistance with cooking/housework;Direct supervision/assist for medications management;Direct supervision/assist for financial management;Assist for transportation;Help with stairs or ramp for entrance    Equipment Recommendations Other (comment) (TBD)  Recommendations for Other Services       Functional Status Assessment Patient has had a recent decline in their functional status and demonstrates the ability to make significant improvements in function in a reasonable and predictable amount of time.     Precautions / Restrictions Precautions Precautions: Fall Restrictions Weight Bearing Restrictions: No      Mobility  Bed Mobility Overal bed mobility: Needs Assistance Bed Mobility: Rolling, Supine to Sit, Sit to Supine Rolling: Mod assist   Supine to sit: Mod assist Sit to supine: Min assist   General bed mobility comments: Pt required mod cues for sequencing/technique and relied heavily on bedrails. Upon sitting EOB, pt with R lateral lean, requiring mod A to sustain balance Patient Response: Cooperative  Transfers                   General transfer comment: Pt refused to attempt standing EOB with RW, stating "I can't, I need to lie back down"    Ambulation/Gait                  Stairs            Wheelchair Mobility    Modified Rankin (Stroke Patients Only) Modified Rankin (Stroke Patients Only) Pre-Morbid Rankin Score: Moderately severe disability Modified Rankin: Severe disability     Balance Overall balance assessment: Needs assistance Sitting-balance support: Bilateral upper extremity supported, Feet supported Sitting balance-Leahy Scale: Poor Sitting balance - Comments: pt with R lateral lean, requiring mod A to maintain static  sitting balance. Only able to tolerate sitting EOB for ~2 minutes prior to requesting to lie back down Postural control: Right lateral lean     Standing balance comment: pt refused to  attempt                             Pertinent Vitals/Pain Pain Assessment Pain Assessment: 0-10 Pain Score: 10-Worst pain ever Pain Location: "all over" Pain Descriptors / Indicators: Discomfort Pain Intervention(s): Limited activity within patient's tolerance, Monitored during session, Premedicated before session, Repositioned    Home Living Family/patient expects to be discharged to:: Skilled nursing facility                   Additional Comments: pt is a poor historian. Initally telling therapist she lives with her mother, then stating she lives at a facility across the street from the hospital but unsure how long she has been there    Prior Function Prior Level of Function : Patient poor historian/Family not available;Needs assist       Physical Assist : Mobility (physical);ADLs (physical) Mobility (physical): Transfers;Gait ADLs (physical): Bathing Mobility Comments: pt reports using rollator at baseline ADLs Comments: pt reports having an aide at home that comes daily that helps her wash up     Hand Dominance   Dominant Hand: Right    Extremity/Trunk Assessment   Upper Extremity Assessment Upper Extremity Assessment: Defer to OT evaluation    Lower Extremity Assessment Lower Extremity Assessment: Generalized weakness    Cervical / Trunk Assessment Cervical / Trunk Assessment: Kyphotic  Communication   Communication: No difficulties;Other (comment) (poor historian/confused)  Cognition Arousal/Alertness: Awake/alert Behavior During Therapy: WFL for tasks assessed/performed Overall Cognitive Status: No family/caregiver present to determine baseline cognitive functioning                                 General Comments: pt poor historian and confused        General Comments      Exercises     Assessment/Plan    PT Assessment Patient needs continued PT services  PT Problem List Decreased strength;Decreased activity  tolerance;Decreased balance;Decreased mobility;Decreased coordination;Cardiopulmonary status limiting activity;Decreased cognition;Pain       PT Treatment Interventions DME instruction;Gait training;Stair training;Functional mobility training;Therapeutic activities;Therapeutic exercise;Cognitive remediation;Neuromuscular re-education;Balance training    PT Goals (Current goals can be found in the Care Plan section)  Acute Rehab PT Goals Patient Stated Goal: to return to SNF PT Goal Formulation: With patient Time For Goal Achievement: 11/25/22 Potential to Achieve Goals: Good    Frequency Min 3X/week     Co-evaluation               AM-PAC PT "6 Clicks" Mobility  Outcome Measure Help needed turning from your back to your side while in a flat bed without using bedrails?: A Lot Help needed moving from lying on your back to sitting on the side of a flat bed without using bedrails?: A Lot Help needed moving to and from a bed to a chair (including a wheelchair)?: Total Help needed standing up from a chair using your arms (e.g., wheelchair or bedside chair)?: Total Help needed to walk in hospital room?: Total Help needed climbing 3-5 steps with a railing? : Total 6 Click Score: 8    End of Session   Activity Tolerance: Patient limited by pain Patient left: in bed;with  bed alarm set;Other (comment) (RN updated on pt's current status) Nurse Communication: Mobility status PT Visit Diagnosis: Unsteadiness on feet (R26.81);Other abnormalities of gait and mobility (R26.89);Muscle weakness (generalized) (M62.81);Pain Pain - Right/Left:  ("all over") Pain - part of body:  ("all over")    Time: 0810-0824 PT Time Calculation (min) (ACUTE ONLY): 14 min   Charges:   PT Evaluation $PT Eval Moderate Complexity: 1 Mod          Becky Sax PT, DPT  Blenda Nicely 11/18/2022, 9:14 AM

## 2022-11-18 NOTE — Progress Notes (Signed)
Pt receives out-pt HD at [facility] on MWF. Appears pt admitted from snf. Will assist as needed.   Melven Sartorius Renal Navigator [phone]

## 2022-11-18 NOTE — Progress Notes (Signed)
CSW received call from [facility] at [facility] who states Raquel Sarna in admissions should be contacted to discuss discharge plan for this patient.   Madilyn Fireman, MSW, LCSW Transitions of Care  Clinical Social Worker II [phone]

## 2022-11-19 DIAGNOSIS — R2990 Unspecified symptoms and signs involving the nervous system: Secondary | ICD-10-CM | POA: Diagnosis not present

## 2022-11-19 LAB — RENAL FUNCTION PANEL
Albumin: 3 g/dL — ABNORMAL LOW (ref 3.5–5.0)
Anion gap: 12 (ref 5–15)
BUN: 15 mg/dL (ref 8–23)
CO2: 30 mmol/L (ref 22–32)
Calcium: 9.5 mg/dL (ref 8.9–10.3)
Chloride: 94 mmol/L — ABNORMAL LOW (ref 98–111)
Creatinine, Ser: 2.1 mg/dL — ABNORMAL HIGH (ref 0.44–1.00)
GFR, Estimated: 25 mL/min — ABNORMAL LOW (ref >60)
Glucose, Bld: 91 mg/dL (ref 70–99)
Phosphorus: 1.8 mg/dL — ABNORMAL LOW (ref 2.5–4.6)
Potassium: 3.3 mmol/L — ABNORMAL LOW (ref 3.5–5.1)
Sodium: 136 mmol/L (ref 135–145)

## 2022-11-19 NOTE — Progress Notes (Signed)
Spoke with operator at [facility] and attempted to call report X3, staff not available to answer phone.

## 2022-11-19 NOTE — Progress Notes (Signed)
D/C order noted. Contacted [place] to advise clinic of pt's d/c today and that pt should resume care tomorrow.   Melven Sartorius Renal Navigator [phone]

## 2022-11-19 NOTE — Progress Notes (Signed)
[facility] Progress Note   Subjective:  Seen in room - no new issues.  Denies dyspnea, edema.  Appears discharging today.   Objective Vitals:   11/19/22 0045 11/19/22 0107 11/19/22 0312 11/19/22 0718  BP: (!) 121/26 (!) 98/27 (!) 115/41 (!) 122/47  Pulse: 83 82 90 77  Resp: '12 16 17 18  '$ Temp:  97.8 F (36.6 C) 98.2 F (36.8 C) 98 F (36.7 C)  TempSrc:  Oral Oral Oral  SpO2: (!) 88% 92% 96% 90%  Weight:      Height:       Physical Exam General: Very frail, lethargic, but able to answer questions more clearly.  Heart: RRR; 2/6 murmur Lungs: CTA anteriorly, normal RR Abdomen: soft  Extremities:no LE edema Dialysis Access: RUE AVG + bruit, old LUE AVF is no longer being used Neuro: L facial droop less, still weak L grip strength  Additional Objective Labs: Basic Metabolic Panel: Recent Labs  Lab 11/16/22 1131 11/16/22 1135 11/17/22 0646 11/17/22 0652 11/18/22 0315 11/19/22 0652  NA 137 136   < > 134* 135 136  K 4.5 4.4   < > 4.7 3.8 3.3*  CL 100 99  --   --  95* 94*  CO2 24  --   --   --  27 30  GLUCOSE 120* 117*  --   --  95 91  BUN 34* 35*  --   --  21 15  CREATININE 4.37* 4.60*  --   --  2.99* 2.10*  CALCIUM 9.6  --   --   --  9.3 9.5  PHOS  --   --   --   --   --  1.8*   < > = values in this interval not displayed.    Liver Function Tests: Recent Labs  Lab 11/16/22 1131 11/19/22 0652  AST 22  --   ALT 15  --   ALKPHOS 91  --   BILITOT 1.4*  --   PROT 6.1*  --   ALBUMIN 3.2* 3.0*    CBC: Recent Labs  Lab 11/16/22 1131 11/16/22 1135 11/17/22 0646 11/17/22 0652 11/18/22 0315  WBC 8.2  --   --   --  7.1  NEUTROABS 5.3  --   --   --   --   HGB 9.8*   < > 9.9* 9.2* 9.3*  HCT 31.7*   < > 29.0* 27.0* 27.2*  MCV 105.7*  --   --   --  97.8  PLT 163  --   --   --  157   < > = values in this interval not displayed.    Studies/Results: CT Angio Chest Pulmonary Embolism (PE) W or WO Contrast  Result Date: 11/18/2022 CLINICAL  DATA:  Shortness of breath.  Pulmonary embolism suspected. EXAM: CT ANGIOGRAPHY CHEST WITH CONTRAST TECHNIQUE: Multidetector CT imaging of the chest was performed using the standard protocol during bolus administration of intravenous contrast. Multiplanar CT image reconstructions and MIPs were obtained to evaluate the vascular anatomy. RADIATION DOSE REDUCTION: This exam was performed according to the departmental dose-optimization program which includes automated exposure control, adjustment of the mA and/or kV according to patient size and/or use of iterative reconstruction technique. CONTRAST:  33m OMNIPAQUE IOHEXOL 350 MG/ML SOLN COMPARISON:  09/10/2022 FINDINGS: Cardiovascular: The heart size is normal. The heart is enlarged. No substantial pericardial effusion. Coronary artery calcification is evident. Ascending thoracic aorta measures 4 cm diameter Enlargement of the pulmonary outflow  tract/main pulmonary arteries suggests pulmonary arterial hypertension. There is no filling defect within the opacified pulmonary arteries to suggest the presence of an acute pulmonary embolus. Mediastinum/Nodes: Enlarged right paratracheal node measured previously at 14 mm is now 11 mm on image 41/5. There is no hilar lymphadenopathy. The esophagus has normal imaging features. There is no axillary lymphadenopathy. Lungs/Pleura: Bronchial wall thickening with peripheral micro nodularity in both upper and lower lobes is similar to prior in some of these tiny nodules appear calcified. No new overtly suspicious pulmonary nodule or mass. Diffuse bronchial wall thickening evident. Mild collapse/consolidation in the posterior right lower lobe is new in the interval. Small bilateral pleural effusions again noted. Upper Abdomen: Reflux of contrast material into the hepatic veins is compatible with right heart dysfunction. Extensive vascular calcification noted in the upper abdomen. Musculoskeletal: No worrisome lytic or sclerotic  osseous abnormality. Diffuse body wall edema evident. Review of the MIP images confirms the above findings. IMPRESSION: 1. No CT evidence for acute pulmonary embolus. 2. Enlargement of the pulmonary outflow tract/main pulmonary arteries suggests pulmonary arterial hypertension. 3. Ascending thoracic aorta measures 4.0 cm diameter. Recommend annual imaging followup by CTA or MRA. This recommendation follows 2010 ACCF/AHA/AATS/ACR/ASA/SCA/SCAI/SIR/STS/SVM Guidelines for the Diagnosis and Management of Patients with Thoracic Aortic Disease. Circulation. 2010; 121: F026-V785. Aortic aneurysm NOS (ICD10-I71.9) 4. Mild collapse/consolidation in the posterior right lower lobe, new in the interval. 5. Similar appearance of the peripheral tree-in-bud nodularity involving both upper and lower lobes. Findings are likely related to sequelae of atypical infection. 6. Small bilateral pleural effusions, similar to prior. 7. Diffuse body wall edema. Electronically Signed   By: Misty Stanley M.D.   On: 11/18/2022 07:42    Medications:    stroke: early stages of recovery book   Does not apply Once   aspirin  81 mg Oral Daily   atorvastatin  80 mg Oral QHS   clopidogrel  75 mg Oral Daily   escitalopram  10 mg Oral q morning   feeding supplement (NEPRO CARB STEADY)  237 mL Oral BID BM   heparin  5,000 Units Subcutaneous Q8H   midodrine  10 mg Oral TID WC   pantoprazole  40 mg Oral Daily   sevelamer carbonate  800 mg Oral TID WC   sodium chloride flush  3 mL Intravenous Once   thiamine  100 mg Oral Daily    Dialysis Orders: MWF at [facility] 4hr, 350/600, EDW 43.5kg, 2K/2Ca, UFP #2, AVG, 16g - Sensipar '30mg'$  PO q HD - Hectoral 37mg IV q HD - Mircera 349m Iv q 2 weeks (last 12/1)   Assessment/Plan:  L facial droop, L sided weakness: Not CVA on imaging, symptoms have improved some and it's reported she's back to baseline.  Neuro has followed. Respiratory alkalosis:  unclear etiology - VBG yest improving.  CTA neg PE.  Serum bicarb 27.   ESRD:  Usual MWF schedule - had HD 12/5 make up that ended in PM and again 12/6 to resume outpt schedule.   Hypertension/volume: BP decent, no edema on exam.  Anemia: Hgb 11.5 -> 9s, no active bleeding, not due for ESA yet.  Metabolic bone disease: CorrCa slightly high initially - VDRA held, then ionized Ca low with alkalosis > d/c sensipar and IV CaGluc given. Ca normal now.  Can resume sensipar at d/c.  CAD/PAD Ok with d/c today if primary planning.    LiJannifer HickD CaSaint [facility] Pager [phone]

## 2022-11-19 NOTE — Progress Notes (Signed)
Patient to SNF via [facility].

## 2022-11-19 NOTE — TOC Transition Note (Signed)
Transition of Care [facility]) - CM/SW Discharge Note   Patient Details  Name: [name] MRN: [id number] Date of Birth: [birth year]  [facility] CM/SW Contact:  Jinger Neighbors, LCSW Phone Number: 11/19/2022, 12:19 PM   Clinical Narrative:    Pt to go back to [facility] via [place] Call to report: [phone]   Final next level of care: Long Term Nursing Home Barriers to Discharge: No Barriers Identified   Patient Goals and CMS Choice   CMS Medicare.gov Compare Post Acute Care list provided to:: Patient Choice offered to / list presented to : Patient  Discharge Placement              Patient chooses bed at:  [facility]) Patient to be transferred to facility by: [place] Name of family member notified: [name] [phone] Patient and family notified of of transfer: 11/19/22  Discharge Plan and Services                                     Social Determinants of Health (SDOH) Interventions     Readmission Risk Interventions    11/11/2021    3:49 PM 08/18/2021    3:01 PM  Readmission Risk Prevention Plan  Transportation Screening Complete Complete  Medication Review ([place]) Complete Complete  PCP or Specialist appointment within 3-5 days of discharge Complete Complete  HRI or Home Care Consult Complete Complete  SW Recovery Care/Counseling Consult Complete Complete  Palliative Care Screening Not Applicable Not Applicable  Skilled Nursing Facility Complete Complete

## 2022-11-19 NOTE — Discharge Summary (Signed)
Physician Discharge Summary  [name] EQA:[id number] DOB: 01-26-1951 DOA: 11/16/2022  PCP: [name], MD  Admit date: 11/16/2022 Discharge date: 11/19/2022  Admitted From: [facility] [facility] Disposition:  [name]  Recommendations for Outpatient Follow-up:  Follow up with PCP in 1-2 weeks  Discharge Condition: Stable CODE STATUS: DNR Diet recommendation: Renal diet with 1200 mL fluid restriction  History of present illness:  [name] is a 71 y.o. 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 difficile, history of staph bacteremia who presented to [facility] ED from SNF with mentation changes.  Patient unable to describe symptoms but SNF staff reported that she had new facial droop and worse weakness on the left side, slurred speech and right-sided gaze; and EMS was activated.  CBGs 200s, HR 106, O2 saturation 98% on room air.  Last HD was Friday.  No recent medication changes.  While in the ED, patient developed an episode of "foaming mouth"and drooling in which she was unable to talk lasting proxy 1 minutes associate with Pok Sia and tachycardia.  Also with reported twitching like movement of bilateral arms.  1 dose of Ativan given and loaded on Keppra.  CT head negative for acute findings, WBC 10.0.  Neurology was consulted.  TRH consulted for further evaluation and management.   Hospital course:  Strokelike symptoms From SNF with change in mentation, facial droop and weakness on the left side with associated slurred speech.  Similar presentation 1 month prior; that time with "seizure-like" symptoms.  Evaluated by neurology during that hospitalization with MR brain, LTM EEG both of which were unrevealing.  Only metabolic derangement of note was respiratory acidosis similar to previous  presentation.   CT head without contrast unrevealing.  MR brain negative.  Patient appears to be at her baseline.  No further workup at this time.  Nephrology initially held Foley but recommended to resume on discharge.   Respiratory alkalosis: Improved VBG on admission with pH greater than 7.7, pCO2 19.  CTA chest negative for pulmonary embolism, suggestive of pulmonary artery hypertension, ascending thoracic aorta 4.0 cm, mild consolidation/collapse posterior right lower lobe, similar appearance peripheral tree-in-bud nodularity both upper/lower lobes consistent with atypical infection, small bilateral pleural effusions. Unclear etiology, patient not tachypneic.  Unknown if associated with medications, Sensipar; which nephrology initially held during hospitalization but recommended resuming on discharge.  pH improved to 7.45 at time of discharge.   Hypocalcemia Corrected calcium slightly high initially, ionized calcium low with alkalosis and Sensipar was held on admission.  Patient was given IV calcium gluconate, calcium now has normalized.   HLD: Atorvastatin 80 mg p.o. daily   History of CVA CAD/PAD Continue Plavix, statin   Chronic diastolic congestive heart failure, compensated Continue volume management with HD.  Continue to encourage monitoring of weights intermittently   Paroxysmal atrial fibrillation Not on anticoagulation outpatient   Depression/anxiety: Lexapro 10 mg p.o. daily   Orthostatic hypotension Midodrine 10 mg p.o. 3 times daily   GERD: Protonix 40 g p.o. daily   Severe protein calorie malnutrition As evidenced by 12% weight loss over 6 months, severe muscle and fat depletion Continue to encourage increased oral intake   Weakness/debility/deconditioning: Discharging back to [facility].  Discharge Diagnoses:  Principal Problem:   Stroke-like symptoms Active Problems:   Hypocalcemia   Respiratory alkalosis  ESRD on dialysis (HCC)   Paroxysmal  atrial fibrillation (HCC)   Chronic diastolic CHF (congestive heart failure) (HCC)   Hypotension   Major depressive disorder, recurrent episode, moderate (HCC)   Protein-calorie malnutrition, severe   Cerebrovascular disease   Extravasation of intravenous contrast medium    Discharge Instructions  Discharge Instructions     Ambulatory referral to Neurology   Complete by: As directed    An appointment is requested in approximately: 8 weeks   Call MD for:  difficulty breathing, headache or visual disturbances   Complete by: As directed    Call MD for:  extreme fatigue   Complete by: As directed    Call MD for:  persistant dizziness or light-headedness   Complete by: As directed    Call MD for:  persistant nausea and vomiting   Complete by: As directed    Call MD for:  severe uncontrolled pain   Complete by: As directed    Call MD for:  temperature >100.4   Complete by: As directed    Diet - low sodium heart healthy   Complete by: As directed    Increase activity slowly   Complete by: As directed    No wound care   Complete by: As directed       Allergies as of 11/19/2022       Reactions   Sulfa Antibiotics Other (See Comments)   Both pt's parents allergic to this type of medication   Adhesive [tape] Itching        Medication List     TAKE these medications    albuterol 108 (90 Base) MCG/ACT inhaler Commonly known as: VENTOLIN HFA Inhale 1-2 puffs into the lungs every 6 (six) hours as needed for wheezing or shortness of breath. What changed: how much to take   albuterol (2.5 MG/3ML) 0.083% nebulizer solution Commonly known as: PROVENTIL Take 3 mLs (2.5 mg total) by nebulization every 4 (four) hours as needed for wheezing. What changed: reasons to take this   atorvastatin 80 MG tablet Commonly known as: LIPITOR Take 80 mg by mouth at bedtime.   benzonatate 200 MG capsule Commonly known as: TESSALON Take 1 capsule (200 mg total) by mouth 2 (two) times  daily as needed for cough.   cinacalcet 30 MG tablet Commonly known as: SENSIPAR Take 1 tablet (30 mg total) by mouth every Monday, Wednesday, and Friday with hemodialysis.   clopidogrel 75 MG tablet Commonly known as: Plavix Take 1 tablet (75 mg total) by mouth daily.   Dextromethorphan-guaiFENesin 20-400 MG Tabs Take 1 tablet by mouth in the morning and at bedtime.   Dialyvite 800 0.8 MG Wafr Take 1 tablet by mouth every morning.   escitalopram 10 MG tablet Commonly known as: LEXAPRO Take 1 tablet (10 mg total) by mouth every morning. What changed: when to take this   hydrocortisone 2.5 % rectal cream Commonly known as: ANUSOL-HC Apply 1 Application topically 4 (four) times daily as needed for hemorrhoids. What changed: when to take this   hydrOXYzine 10 MG tablet Commonly known as: ATARAX Take 1 tablet (10 mg total) by mouth 3 (three) times daily as needed for itching. What changed: when to take this   midodrine 10 MG tablet Commonly known as: PROAMATINE Take 1 tablet (10 mg total) by mouth 3 (three) times daily with meals.   NUTRITIONAL SUPPLEMENT PO Take 1 Container by mouth 2 (two) times daily. AHR-Frozen Nutritional Treat   pantoprazole 40 MG tablet Commonly  known as: PROTONIX Take 1 tablet (40 mg total) by mouth daily.   Preparation H Rapid Relief 5-0.25-14.4-15 % Crea Apply 1 Application topically every 6 (six) hours as needed (hemorrhoids).   sevelamer carbonate 800 MG tablet Commonly known as: RENVELA Take 1 tablet (800 mg total) by mouth 3 (three) times daily with meals.   thiamine 100 MG tablet Commonly known as: VITAMIN B1 Take 1 tablet (100 mg total) by mouth daily.        Follow-up Information     [name], MD. Schedule an appointment as soon as possible for a visit in 1 week(s).   Specialty: Internal Medicine Contact information: [address] [place] [address] [phone]                Allergies   Allergen Reactions   Sulfa Antibiotics Other (See Comments)    Both pt's parents allergic to this type of medication    Adhesive [Tape] Itching    Consultations: Neurology, Dr. Quinn Axe Nephrology   Procedures/Studies: CT Angio Chest Pulmonary Embolism (PE) W or WO Contrast  Result Date: 11/18/2022 CLINICAL DATA:  Shortness of breath.  Pulmonary embolism suspected. EXAM: CT ANGIOGRAPHY CHEST WITH CONTRAST TECHNIQUE: Multidetector CT imaging of the chest was performed using the standard protocol during bolus administration of intravenous contrast. Multiplanar CT image reconstructions and MIPs were obtained to evaluate the vascular anatomy. RADIATION DOSE REDUCTION: This exam was performed according to the departmental dose-optimization program which includes automated exposure control, adjustment of the mA and/or kV according to patient size and/or use of iterative reconstruction technique. CONTRAST:  51m OMNIPAQUE IOHEXOL 350 MG/ML SOLN COMPARISON:  09/10/2022 FINDINGS: Cardiovascular: The heart size is normal. The heart is enlarged. No substantial pericardial effusion. Coronary artery calcification is evident. Ascending thoracic aorta measures 4 cm diameter Enlargement of the pulmonary outflow tract/main pulmonary arteries suggests pulmonary arterial hypertension. There is no filling defect within the opacified pulmonary arteries to suggest the presence of an acute pulmonary embolus. Mediastinum/Nodes: Enlarged right paratracheal node measured previously at 14 mm is now 11 mm on image 41/5. There is no hilar lymphadenopathy. The esophagus has normal imaging features. There is no axillary lymphadenopathy. Lungs/Pleura: Bronchial wall thickening with peripheral micro nodularity in both upper and lower lobes is similar to prior in some of these tiny nodules appear calcified. No new overtly suspicious pulmonary nodule or mass. Diffuse bronchial wall thickening evident. Mild collapse/consolidation in the  posterior right lower lobe is new in the interval. Small bilateral pleural effusions again noted. Upper Abdomen: Reflux of contrast material into the hepatic veins is compatible with right heart dysfunction. Extensive vascular calcification noted in the upper abdomen. Musculoskeletal: No worrisome lytic or sclerotic osseous abnormality. Diffuse body wall edema evident. Review of the MIP images confirms the above findings. IMPRESSION: 1. No CT evidence for acute pulmonary embolus. 2. Enlargement of the pulmonary outflow tract/main pulmonary arteries suggests pulmonary arterial hypertension. 3. Ascending thoracic aorta measures 4.0 cm diameter. Recommend annual imaging followup by CTA or MRA. This recommendation follows 2010 ACCF/AHA/AATS/ACR/ASA/SCA/SCAI/SIR/STS/SVM Guidelines for the Diagnosis and Management of Patients with Thoracic Aortic Disease. Circulation. 2010; 121:: Z329-J242 Aortic aneurysm NOS (ICD10-I71.9) 4. Mild collapse/consolidation in the posterior right lower lobe, new in the interval. 5. Similar appearance of the peripheral tree-in-bud nodularity involving both upper and lower lobes. Findings are likely related to sequelae of atypical infection. 6. Small bilateral pleural effusions, similar to prior. 7. Diffuse body wall edema. Electronically Signed   By: ERandall Hiss  Tery Sanfilippo M.D.   On: 11/18/2022 07:42   EEG adult  Result Date: 11/16/2022 Spero Curb, MD     11/16/2022  9:53 PM [facility] Services Routine EEG Report Video Performed: Performed Demographics: Patient Name:   Adelfa Koh Date of Birth:   11-09-1951 Identification Number:   MRN - [id number] Study Times: Study Start Time:   11/16/2022 16:00:00 Study End Time:   11/16/2022 16:35:00 Indication(s): Encephalopathy Technical Summary: This EEG was performed utilizing standard International 10-20 System of electrode placement. One channel electrocardiogram was monitored. Data were obtained,  stored, and interpreted utilizing referential montage recording, with reformatting to longitudinal, transverse bipolar, and referential montages as necessary for interpretation. State(s):       Awake      Drowsy Activation Procedures: Hyperventilation: Not performed Photic Stimulation: Not performed EEG Description: The patient appeared somnolent, the background showed an absent of posterior dominant alpha rhythm. There were continuous of diffuse 4-5 Hz theta more than 3 Hz delta activity noted.  The amplitudes were 30uV.  There were no physiologic sleep structures observed.  There was no clinical event noted.  Throughout the record, there was no epileptiform abnormality or lateralizing sign observed.  Intermittent myogenic and movement artifacts were noted. Impression: This is an abnormal EEG indicative of a moderately severe diffuse encephalopathy. This is non-specific in etiology.  No epileptiform abnormality, electrographic seizures or evidence of status epilepticus were present. No lateralizing sign is observed. Dr Spero Curb [facility] For Inpatient follow-up with [facility] physician please call [facility] [phone]. This is not an outpatient service. Post hospital discharge, please contact hospital directly.   DG Chest Port 1 View  Result Date: 11/16/2022 CLINICAL DATA:  Altered mental status. EXAM: PORTABLE CHEST 1 VIEW COMPARISON:  Chest radiograph dated 10/20/2022. FINDINGS: Background of chronic interstitial coarsening and bronchitic changes. No focal consolidation, pleural effusion, or pneumothorax. Stable cardiomegaly. Improved central vascular congestion since the prior radiograph. Atherosclerotic calcification of the aorta. No acute osseous pathology. IMPRESSION: 1. No acute cardiopulmonary process. 2. Stable cardiomegaly. Electronically Signed   By: Anner Crete M.D.   On: 11/16/2022 19:27   MR BRAIN WO CONTRAST  Result Date: 11/16/2022 CLINICAL DATA:  Stroke suspected  EXAM: MRI HEAD WITHOUT CONTRAST TECHNIQUE: Multiplanar, multiecho pulse sequences of the brain and surrounding structures were obtained without intravenous contrast. COMPARISON:  10/21/2022 MRI head, correlation is also made with CT head 11/16/2022 FINDINGS: Evaluation is somewhat limited by abnormal head position and motion. In addition, the exam could not be completed, and now axial T1 or coronal T2 sequence could be performed. Brain: No restricted diffusion to suggest acute or subacute infarct. No acute hemorrhage, mass, mass effect, midline shift. Advanced cerebral atrophy for age. Confluent T2 hyperintense signal in the periventricular white matter, likely the sequela of severe chronic small vessel ischemic disease. No hydrocephalus or extra-axial collection. Vascular: Limited but grossly normal arterial flow voids. Skull and upper cervical spine: Grossly normal marrow signal. Partially imaged ACDF. Sinuses/Orbits: Motion limited but no definite acute abnormality. Other: Fluid in the right mastoid air cells. IMPRESSION: Evaluation is limited by abnormal head position, motion artifact, and incomplete exam. Within this limitation, no evidence of acute or subacute infarct. Electronically Signed   By: Merilyn Baba M.D.   On: 11/16/2022 17:44   CT HEAD CODE STROKE WO CONTRAST  Result Date: 11/16/2022 CLINICAL DATA:  Code stroke. Neuro deficit, acute, stroke suspected. EXAM: CT HEAD WITHOUT CONTRAST TECHNIQUE: Contiguous axial images were obtained from the base of the  skull through the vertex without intravenous contrast. RADIATION DOSE REDUCTION: This exam was performed according to the departmental dose-optimization program which includes automated exposure control, adjustment of the mA and/or kV according to patient size and/or use of iterative reconstruction technique. COMPARISON:  Head CT 10/19/2022 and MRI 10/21/2022 FINDINGS: Brain: There is no evidence of an acute cortically based infarct, mass, midline  shift, or extra-axial fluid collection. Confluent hypodensities in the cerebral white matter bilaterally are similar to the prior CT and are nonspecific but compatible with severe chronic small vessel ischemic disease. Chronic lacunar infarcts are again noted in the deep gray nuclei and pons. There is moderate cerebral atrophy. Vascular: Calcified atherosclerosis at the skull base. No hyperdense vessel. Skull: No acute fracture or suspicious osseous lesion. Sinuses/Orbits: The visualized paranasal sinuses are clear. Persistent moderate right and small left mastoid effusions. Bilateral cataract extraction. Other: None. ASPECTS ([place] Stroke Program Early CT Score) - Ganglionic level infarction (caudate, lentiform nuclei, internal capsule, insula, M1-M3 cortex): 7 - Supraganglionic infarction (M4-M6 cortex): 3 Total score (0-10 with 10 being normal): 10 These results were communicated to Dr. Quinn Axe at 11:50 am on 11/16/2022 by text page via the [facility] messaging system. IMPRESSION: 1. No evidence of acute intracranial abnormality. 2. Severe chronic small vessel ischemic disease. Electronically Signed   By: Logan Bores M.D.   On: 11/16/2022 11:50   DG Swallowing Func-Speech Pathology  Result Date: 10/23/2022 Table formatting from the original result was not included. Objective Swallowing Evaluation: Type of Study: MBS-Modified Barium Swallow Study  Patient Details Name: GWENDOLEN HEWLETT MRN: [id number] Date of Birth: Mar 28, 1951 Today's Date: 10/23/2022 Time: SLP Start Time (ACUTE ONLY): 2595 -SLP Stop Time (ACUTE ONLY): 6387 SLP Time Calculation (min) (ACUTE ONLY): 13 min Past Medical History: Past Medical History: Diagnosis Date  Acute ischemic stroke ([place]) 02/23/2017  Acute on chronic diastolic CHF (congestive heart failure) ([place])   Adenomatous polyp of colon 10/2010, 2006, 2015  Anemia in CKD (chronic kidney disease) 11/07/2012  s/p blood transfusion.   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  Had some early rejection then stable function for years, then had slow decline of function and went back on hemodialysis in 2012.  Gets HD TTS schedule at [facility] on [facility] still using L forearm AVF.  GERD (gastroesophageal reflux disease)   GI bleed 2017  felt to be ischemic colitis, last colo 2015  Hyperlipidemia   Hypertension   Neuromuscular disorder (HCC)   neuropathy hand and legs  Osteoporosis   Pseudoaneurysm of surgical AV fistula (HCC)   left upper arm Past Surgical History: Past Surgical History: Procedure Laterality Date  ABDOMINAL AORTOGRAM W/LOWER EXTREMITY N/A 03/26/2022  Procedure: ABDOMINAL AORTOGRAM W/LOWER EXTREMITY;  Surgeon: Marty Heck, MD;  Location: [place] [facility];  Service: Cardiovascular;  Laterality: N/A;  Left  AV FISTULA PLACEMENT    for dialysis  AV FISTULA PLACEMENT Left 11/22/2015  Procedure: ARTERIOVENOUS (AV) FISTULA CREATION-LEFT BRACHIOCEPHALIC;  Surgeon: Serafina Mitchell, MD;  Location: [place];  Service: Vascular;  Laterality: Left;  AV FISTULA PLACEMENT Right 03/15/2020  Procedure: INSERTION OF ARTERIOVENOUS (AV) GORE-TEX GRAFT ARM ( BRACHIAL AXILLARY );  Surgeon: Hortencia Pilar  G, MD;  Location: [facility];  Service: Vascular;  Laterality: Right;  AV FISTULA PLACEMENT Right 05/15/2022  Procedure: EXPLORATION  OF  RIGHT ARM ARTERIOVENOUS GORE-TEX GRAFT;  Surgeon: Angelia Mould, MD;  Location: [place];  Service: Vascular;  Laterality: Right;  [name] REMOVAL Right 04/27/2022  Procedure: REVISION OF RIGHT ARTERIOVENOUS GRAFT AND EXCISION OF RIGHT DEGENERATED GRAFT (AVG);  Surgeon: Angelia Mould, MD;  Location: [place];  Service: Vascular;  Laterality:  Right;  BACK SURGERY    CERVICAL FUSION    CHOLECYSTECTOMY  12/02/2012  Procedure: LAPAROSCOPIC CHOLECYSTECTOMY WITH INTRAOPERATIVE CHOLANGIOGRAM;  Surgeon: Edward Jolly, MD;  Location: [place];  Service: General;  Laterality: N/A;  EYE SURGERY Bilateral   cataract surgery  EYE SURGERY Left 2019  laser  HEMATOMA EVACUATION Left 12/24/2016  Procedure: EVACUATION HEMATOMA LEFT UPPER ARM;  Surgeon: Waynetta Sandy, MD;  Location: [place];  Service: Vascular;  Laterality: Left;  I & D EXTREMITY Left 12/31/2016  Procedure: IRRIGATION AND DEBRIDEMENT EXTREMITY;  Surgeon: Angelia Mould, MD;  Location: [place];  Service: Vascular;  Laterality: Left;  INSERTION OF DIALYSIS CATHETER Right 12/24/2016  Procedure: INSERTION OF DIALYSIS CATHETER;  Surgeon: Waynetta Sandy, MD;  Location: [place];  Service: Vascular;  Laterality: Right;  INSERTION OF DIALYSIS CATHETER Right 02/04/2017  Procedure: INSERTION OF DIALYSIS CATHETER;  Surgeon: Waynetta Sandy, MD;  Location: [place];  Service: Vascular;  Laterality: Right;  [place] Left 10/23/2016  Procedure: Fistulagram;  Surgeon: Elam Dutch, MD;  Location: [place] CV [facility];  Service: Cardiovascular;  Laterality: Left;  PERIPHERAL VASCULAR INTERVENTION  03/26/2022  Procedure: PERIPHERAL VASCULAR INTERVENTION;  Surgeon: Marty Heck, MD;  Location: [place] [facility];  Service: Cardiovascular;;  left sfa  PERIPHERAL VASCULAR THROMBECTOMY Right 04/16/2020  Procedure: PERIPHERAL VASCULAR THROMBECTOMY;  Surgeon: Katha Cabal, MD;  Location: [place] [facility];  Service: Cardiovascular;  Laterality: Right;  RESECTION OF ARTERIOVENOUS FISTULA ANEURYSM Left 11/22/2015  Procedure: RESECTION OF LEFT RADIOCEPHALIC FISTULA ANEURYSM ;  Surgeon: Serafina Mitchell, MD;  Location: [place] OR;  Service: Vascular;  Laterality: Left;  REVISON OF ARTERIOVENOUS FISTULA Left 12/22/2016  Procedure: REVISON OF LEFT  ARTERIOVENOUS FISTULA;  Surgeon: Waynetta Sandy, MD;  Location: [place];  Service: Vascular;  Laterality: Left;  REVISON OF ARTERIOVENOUS FISTULA Left 02/04/2017  Procedure: REVISON OF LEFT UPPER ARM ARTERIOVENOUS FISTULA;  Surgeon: Waynetta Sandy, MD;  Location: [place];  Service: Vascular;  Laterality: Left;  UPPER EXTREMITY ANGIOGRAPHY Bilateral 09/19/2019  Procedure: UPPER EXTREMITY ANGIOGRAPHY;  Surgeon: Katha Cabal, MD;  Location: [place] [facility];  Service: Cardiovascular;  Laterality: Bilateral; HPI: Pt is a 71 yo female admitted from SNF with AMS. Per neurology consult note, differential dx may include seizures, toxic metabolic encephalopathy, or dementia related cognitive fluctuation. Work up currently underway, with continuous EEG started and MRI pending. Previous swallow eval in August 2021 recommending regular diet and thin liquids but with concern for esophageal component (eructation, c/o pain in her stomach after eating, multiple swallows). 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  Recommendations may be updated based on patient status, additional  functional criteria and insurance authorization. Assessment / Plan / Recommendation   10/23/2022  10:30 AM Clinical Impressions Clinical Impression MBS completed in setting of lethargy, decreased awareness and challenging positiong with pt requiring feeder to hold head in upright position. Noted presence of cervical hardware. Pt demonstrated mild oropharyngeal impairments with transient laryngeal penetration without aspiration observed. Oral phase marked by decreased cohesion  and minimally delayed manipulation and transit with lingual residue with thin. She was unable to utilize straw during study and lack of dentition and cognition prohibited assessment of solid texture. Primary impairment of pharyngeal phase was delayed swallow initiation with barium reaching vallecule and pyriform sinuses and remained for range of 8-12 seconds before swallow was triggered. Larger sip of thin was penetrated into vestibule but exited during the study (PAS 2). There was min-mild vallecular and pyriform sinus residue with thin. Overall, strength and mobility of swallow was adequate. Given her lethargy, decreased cognitive ability and noted wet vocal quality prior to study, recommend she continue nectar thick liquids and puree diet, crush meds with full supervision/assit with meals SLP Visit Diagnosis Dysphagia, oropharyngeal phase (R13.12) Impact on safety and function Moderate aspiration risk     10/23/2022  10:30 AM Treatment Recommendations Treatment Recommendations Therapy as outlined in treatment plan below     10/23/2022  10:30 AM Prognosis Prognosis for Safe Diet Advancement Good Barriers to Reach Goals Cognitive deficits   10/23/2022  10:30 AM Diet Recommendations SLP Diet Recommendations Nectar thick liquid;Dysphagia 1 (Puree) solids Liquid Administration via Cup;Straw Medication Administration Crushed with puree Compensations Minimize environmental distractions;Slow rate;Small sips/bites;Lingual sweep for clearance of pocketing;Monitor for anterior loss Postural Changes Seated upright at 90 degrees     10/23/2022  10:30 AM Other Recommendations Oral Care Recommendations Oral care BID Follow Up Recommendations Skilled nursing-short term rehab (<3 hours/day) Assistance recommended at discharge Frequent or constant Supervision/Assistance Functional Status Assessment Patient has had a recent decline in their functional status and demonstrates the ability to make significant improvements in function  in a reasonable and predictable amount of time.   10/23/2022  10:30 AM Frequency and Duration  Speech Therapy Frequency (ACUTE ONLY) min 2x/week Treatment Duration 2 weeks     10/23/2022  10:30 AM Oral Phase Oral Phase Impaired Oral - Nectar Teaspoon Decreased bolus cohesion;Delayed oral transit;Reduced posterior propulsion Oral - Nectar Cup Delayed oral transit;Decreased bolus cohesion;Reduced posterior propulsion Oral - Thin Cup Decreased bolus cohesion;Lingual/palatal residue;Holding of bolus Oral - Puree Weak lingual manipulation;Reduced posterior propulsion;Delayed oral transit    10/23/2022  10:30 AM Pharyngeal Phase Pharyngeal Phase Impaired Pharyngeal- Nectar Teaspoon Delayed swallow initiation-vallecula Pharyngeal- Nectar Cup Delayed swallow initiation-vallecula;Delayed swallow initiation-pyriform sinuses Pharyngeal- Thin Cup Delayed swallow initiation-pyriform sinuses;Delayed swallow initiation-vallecula;Penetration/Aspiration during swallow;Pharyngeal residue - pyriform;Pharyngeal residue - valleculae Pharyngeal Material enters airway, remains ABOVE vocal cords then ejected out Pharyngeal- Puree Delayed swallow initiation-vallecula    10/23/2022  10:30 AM Cervical Esophageal Phase  Cervical Esophageal Phase WFL [place] Siren 10/23/2022, 10:59 AM                     CT ANGIO HEAD NECK W WO CM  Result Date: 10/21/2022 CLINICAL DATA:  Stroke suspected EXAM: CT ANGIOGRAPHY HEAD AND NECK TECHNIQUE: Multidetector CT imaging of the head and neck was performed using the standard protocol during bolus administration of intravenous contrast. Multiplanar CT image reconstructions and MIPs were obtained to evaluate the vascular anatomy. Carotid stenosis measurements (when applicable) are obtained utilizing NASCET criteria, using the distal internal carotid  diameter as the denominator. RADIATION DOSE REDUCTION: This exam was performed according to the departmental dose-optimization program which includes  automated exposure control, adjustment of the mA and/or kV according to patient size and/or use of iterative reconstruction technique. CONTRAST:  34m OMNIPAQUE IOHEXOL 350 MG/ML SOLN COMPARISON:  Prior CTA of the head and neck, correlation is made with CT head 10/19/2022 and MRA head 03/02/2021 FINDINGS: CT HEAD FINDINGS For noncontrast findings, please see 10/19/2022 CT head. CTA NECK FINDINGS Aortic arch: Two-vessel arch with a common origin of the brachiocephalic and left common carotid arteries. Imaged portion shows no evidence of aneurysm or dissection. No significant stenosis of the major arch vessel origins. Aortic atherosclerosis. The proximal left subclavian artery is quite tortuous and has a retrotracheal course (series 7, image 275). Right carotid system: No evidence of dissection, occlusion, or hemodynamically significant stenosis (greater than 50%). Atherosclerotic disease in the distal common carotid, at the bifurcation, and in the proximal ICA is not hemodynamically significant. Left carotid system: No evidence of dissection, occlusion, or hemodynamically significant stenosis (greater than 50%). Atherosclerotic disease in the distal common carotid, at the bifurcation, and in the proximal ICA is not hemodynamically significant. Vertebral arteries: No evidence of dissection, occlusion, or hemodynamically significant stenosis (greater than 50%). Right dominant system. Mild irregularity of the size of the left vertebral artery, without significant stenosis. Skeleton: Widening of the atlantodental interval, which appears chronic. Status post ACDF C3-C4 with evidence of prior fusion C5-C7. 5 mm anterolisthesis of C7 on T1 with severe endplate degenerative changes, unchanged which appears compared to 09/02/2022 chest CT. No acute osseous abnormality. Poor dentition with periapical lucencies about several maxillary teeth. Other neck: No acute finding. Upper chest: Increased tree-in-bud opacities in the  right-greater-than-left upper lobe compared to 09/02/2022. Possible fluid in the left fissure and interlobular septal thickening. Review of the MIP images confirms the above findings CTA HEAD FINDINGS Anterior circulation: Both internal carotid arteries are patent to the termini, with mild-to-moderate stenosis in the bilateral cavernous and supraclinoid ICA segments. A1 segments patent. Normal anterior communicating artery. Anterior cerebral arteries are patent to their distal aspects. No M1 stenosis or occlusion. MCA branches perfused and symmetric. Posterior circulation: Poor opacification of the left V4, likely reflecting moderate to severe stenosis in the mid to distal V4. No hemodynamically significant stenosis in the right vertebral artery. The right vertebral artery is dominant and patent to the vertebrobasilar junction. Posterior inferior cerebellar arteries patent proximally, although there is diminished opacification left PICA. Basilar patent to its distal aspect. Superior cerebellar arteries patent proximally. Patent P1 segments. PCAs perfused to their distal aspects without stenosis. The right posterior communicating artery is diminutive but patent. Venous sinuses: Not well opacified. Anatomic variants: None significant. Review of the MIP images confirms the above findings IMPRESSION: 1. 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 2. Mild-to-moderate stenosis in the bilateral cavernous and supraclinoid ICA segments. 3. No hemodynamically significant stenosis in the neck. 4. Increased tree-in-bud opacities in the right-greater-than-left upper lobes, concerning for infection or aspiration. 5. Aortic atherosclerosis. Aortic Atherosclerosis (ICD10-I70.0). Electronically Signed   By: AMerilyn BabaM.D.   On: 10/21/2022 23:10   MR BRAIN WO CONTRAST  Result Date: 10/21/2022 CLINICAL DATA:  Altered mental status EXAM: MRI HEAD WITHOUT CONTRAST  TECHNIQUE: Multiplanar, multiecho pulse sequences of the brain and surrounding structures were obtained without intravenous contrast. COMPARISON:  02/22/2021 FINDINGS: Evaluation is somewhat limited by motion artifact. Brain:  No restricted diffusion with ADC correlate to suggest acute or subacute infarct. There is some T2 shine through in the right parietal and occipital periventricular white matter (series 5, images 76-80 and series 11, images 15-17), which is associated with focal encephalomalacia (series 16, image 37), new from 03/02/2021 and favored to represent now remote interval infarct. No acute hemorrhage, mass, mass effect, or midline shift. Confluence T2 hyperintense signal in the periventricular white matter and pons, likely the sequela of severe chronic small vessel ischemic disease. Advanced cerebral volume loss for age. Remote lacunar infarcts in the pons, bilateral basal ganglia, and thalami. Vascular: Patent arterial flow voids. Skull and upper cervical spine: Normal marrow signal. Status post ACDF C3-C4. Sinuses/Orbits: No acute finding. Other: Fluid in the right-greater-than-left mastoid air cells. IMPRESSION: No acute intracranial process. No evidence of acute or subacute infarct. Electronically Signed   By: Merilyn Baba M.D.   On: 10/21/2022 22:09   Overnight EEG with video  Result Date: 10/21/2022 Lora Havens, MD     10/21/2022 11:43 AM Patient Name: KINZIE WICKES MRN: [id number] Epilepsy Attending: Lora Havens Referring Physician/Provider: Donnetta Simpers, MD Duration: 10/20/2022 1000 to 10/21/2022 4332  Patient history: 71 year old patient with end-stage renal disease hypertension hyperlipidemia CAD and previous stroke presents with acute onset confusion and hallucinations. EEG to evaluate for seizure  Level of alertness: lethargic, asleep  AEDs during EEG study: LEV  Technical aspects: This EEG study was done with scalp electrodes positioned according to the 10-20  International system of electrode placement. Electrical activity was reviewed with band pass filter of 1-'70Hz'$ , sensitivity of 7 uV/mm, display speed of 78m/sec with a '60Hz'$  notched filter applied as appropriate. EEG data were recorded continuously and digitally stored.  Video monitoring was available and reviewed as appropriate.  Description: During awake state, no clear posterior dominant rhythm was seen. Sleep was characterized by sleep spindles (12-14), maximal frontocentral region. EEG showed continuous generalized 3 to 6 Hz theta-delta slowing.  Generalized periodic discharges with triphasic morphology at '1hz'$  were also noted, more frequent when awake/stimulated.  Hyperventilation and photic stimulation were not performed.    ABNORMALITY - Periodic discharges with triphasic morphology, generalized ( GPDs) - Continuous slow, generalized  IMPRESSION: This study showed generalized periodic discharges with triphasic morphology which are most likely due to toxic-metabolic causes due to the morphology, frequency and reactivity to stimulation. Additionally there was moderate to severe diffuse encephalopathy, nonspecific etiology.  No seizures were seen during the study.  PLora Havens  EEG adult  Result Date: 10/20/2022 YLora Havens MD     10/21/2022 11:33 AM Patient Name: VMAGDA MUISEMRN: [id number] Attending: PLora HavensReferring Physician/Provider: KDonnetta Simpers MD Date: 10/20/2022 Duration: 33.05 mins Patient history: 71year old patient with end-stage renal disease hypertension hyperlipidemia CAD and previous stroke presents with acute onset confusion and hallucinations. EEG to evaluate for seizure Level of alertness: lethargic AEDs during EEG study: None Technical aspects: This EEG study was done with scalp electrodes positioned according to the 10-20 International system of electrode placement. Electrical activity was reviewed with band pass filter of 1-'70Hz'$ , sensitivity of  7 uV/mm, display speed of 358msec with a '60Hz'$  notched filter applied as appropriate. EEG data were recorded continuously and digitally stored.  Video monitoring was available and reviewed as appropriate. Description: EEG showed continuous generalized 3 to 6 Hz theta-delta slowing.  Generalized periodic discharges with triphasic morphology at '1hz'$  were also noted.  Hyperventilation and photic stimulation were not  performed.   Patient was noted to have left shoulder twitching in the area over the dialysis fistula.  Concomitant EEG before, during and after the event did not show any EEG changes suggest seizure. ABNORMALITY - Periodic discharges with triphasic morphology, generalized ( GPDs) - Continuous slow, generalized IMPRESSION: This study showed generalized periodic discharges with triphasic morphology which can get the ictal-interictal continuum.  However the frequency and morphology is more commonly indicative of toxic-metabolic causes.  Additionally there was moderate to severe diffuse encephalopathy, nonspecific etiology. Patient was noted to have left shoulder twitching in the area over the dialysis fistula without concomitant EEG change.  This is most likely not an epileptic event.  Priyanka Barbra Sarks     Subjective: Patient seen examined bedside, resting calmly.  Lying in bed.  No specific complaints this morning.  Appears to be at her typical baseline.  Discharging back to SNF today.  No other questions or concerns at this time.  Denies headache, no dizziness, no chest pain, no palpitations, no shortness of breath, no abdominal pain, no focal weakness, no fatigue, no paresthesias.  No acute events overnight per nursing staff.  Discharge Exam: Vitals:   11/19/22 0312 11/19/22 0718  BP: (!) 115/41 (!) 122/47  Pulse: 90 77  Resp: 17 18  Temp: 98.2 F (36.8 C) 98 F (36.7 C)  SpO2: 96% 90%   Vitals:   11/19/22 0045 11/19/22 0107 11/19/22 0312 11/19/22 0718  BP: (!) 121/26 (!) 98/27 (!) 115/41  (!) 122/47  Pulse: 83 82 90 77  Resp: '12 16 17 18  '$ Temp:  97.8 F (36.6 C) 98.2 F (36.8 C) 98 F (36.7 C)  TempSrc:  Oral Oral Oral  SpO2: (!) 88% 92% 96% 90%  Weight:      Height:        Physical Exam: GEN: NAD, alert and oriented x 3, chronically ill in appearance HEENT: NCAT, PERRL, EOMI, sclera clear, MMM PULM: CTAB w/o wheezes/crackles, normal respiratory effort, on room air CV: RRR w/o M/G/R GI: abd soft, NTND, NABS, no R/G/M MSK: no peripheral edema, moves all EXTR independently NEURO: CN II-XII intact, no focal deficits, sensation to light touch intact PSYCH: normal mood/affect    The results of significant diagnostics from this hospitalization (including imaging, microbiology, ancillary and laboratory) are listed below for reference.     Microbiology: No results found for this or any previous visit (from the past 240 hour(s)).   Labs: BNP (last 3 results) Recent Labs    02/02/22 0152 05/06/22 1000 09/10/22 1900  BNP >4,500.0* >4,500.0* 5,465.0*   Basic Metabolic Panel: Recent Labs  Lab 11/16/22 1131 11/16/22 1135 11/17/22 0646 11/17/22 0652 11/18/22 0315 11/19/22 0652  NA 137 136 135 134* 135 136  K 4.5 4.4 4.6 4.7 3.8 3.3*  CL 100 99  --   --  95* 94*  CO2 24  --   --   --  27 30  GLUCOSE 120* 117*  --   --  95 91  BUN 34* 35*  --   --  21 15  CREATININE 4.37* 4.60*  --   --  2.99* 2.10*  CALCIUM 9.6  --   --   --  9.3 9.5  PHOS  --   --   --   --   --  1.8*   Liver Function Tests: Recent Labs  Lab 11/16/22 1131 11/19/22 0652  AST 22  --   ALT 15  --   ALKPHOS  91  --   BILITOT 1.4*  --   PROT 6.1*  --   ALBUMIN 3.2* 3.0*   No results for input(s): "LIPASE", "AMYLASE" in the last 168 hours. Recent Labs  Lab 11/17/22 0641  AMMONIA 24   CBC: Recent Labs  Lab 11/16/22 1131 11/16/22 1135 11/17/22 0646 11/17/22 0652 11/18/22 0315  WBC 8.2  --   --   --  7.1  NEUTROABS 5.3  --   --   --   --   HGB 9.8* 10.5* 9.9* 9.2* 9.3*   HCT 31.7* 31.0* 29.0* 27.0* 27.2*  MCV 105.7*  --   --   --  97.8  PLT 163  --   --   --  157   Cardiac Enzymes: No results for input(s): "CKTOTAL", "CKMB", "CKMBINDEX", "TROPONINI" in the last 168 hours. BNP: Invalid input(s): "POCBNP" CBG: Recent Labs  Lab 11/16/22 1129  GLUCAP 122*   D-Dimer No results for input(s): "DDIMER" in the last 72 hours. Hgb A1c No results for input(s): "HGBA1C" in the last 72 hours. Lipid Profile Recent Labs    11/17/22 0500  CHOL 139  HDL 55  LDLCALC 61  TRIG 114  CHOLHDL 2.5   Thyroid function studies Recent Labs    11/17/22 0641  TSH 5.999*   Anemia work up No results for input(s): "VITAMINB12", "FOLATE", "FERRITIN", "TIBC", "IRON", "RETICCTPCT" in the last 72 hours. Urinalysis    Component Value Date/Time   COLORURINE YELLOW 01/30/2011 1648   APPEARANCEUR CLOUDY (A) 01/30/2011 1648   LABSPEC 1.016 01/30/2011 1648   PHURINE 5.0 01/30/2011 1648   GLUCOSEU NEGATIVE 10/14/2009 0747   HGBUR SMALL (A) 01/30/2011 1648   BILIRUBINUR NEGATIVE 01/30/2011 1648   KETONESUR NEGATIVE 01/30/2011 1648   PROTEINUR 100 (A) 01/30/2011 1648   UROBILINOGEN 0.2 01/30/2011 1648   NITRITE NEGATIVE 01/30/2011 1648   LEUKOCYTESUR SMALL (A) 01/30/2011 1648   Sepsis Labs Recent Labs  Lab 11/16/22 1131 11/18/22 0315  WBC 8.2 7.1   Microbiology No results found for this or any previous visit (from the past 240 hour(s)).   Time coordinating discharge: Over 30 minutes  SIGNED:   Donnamarie Poag [place] DO  [facility] 11/19/2022, 10:11 AM

## 2022-11-20 DIAGNOSIS — R0602 Shortness of breath: Secondary | ICD-10-CM | POA: Diagnosis not present

## 2022-11-20 DIAGNOSIS — N186 End stage renal disease: Secondary | ICD-10-CM | POA: Diagnosis not present

## 2022-11-20 DIAGNOSIS — E785 Hyperlipidemia, unspecified: Secondary | ICD-10-CM | POA: Diagnosis not present

## 2022-11-20 DIAGNOSIS — I509 Heart failure, unspecified: Secondary | ICD-10-CM | POA: Diagnosis not present

## 2022-11-20 LAB — CALCIUM, IONIZED: Calcium, Ionized, Serum: 4.8 mg/dL (ref 4.5–5.6)

## 2022-12-03 IMAGING — CT CT CHEST W/O CM
2 of 3 series · 15 of 36 positions shown, 18 images · non-contrast
Comparison: 11/20/2019

CLINICAL DATA: Pneumonia, short of breath, hypoxia



[Series 3: chest w/o 2mm st · axial · non-contrast · 0.60mm/px · z∈[+1192,+1458]mm · 12 of 157 slices shown, 15 images]
[im 12/157  mediastinal]
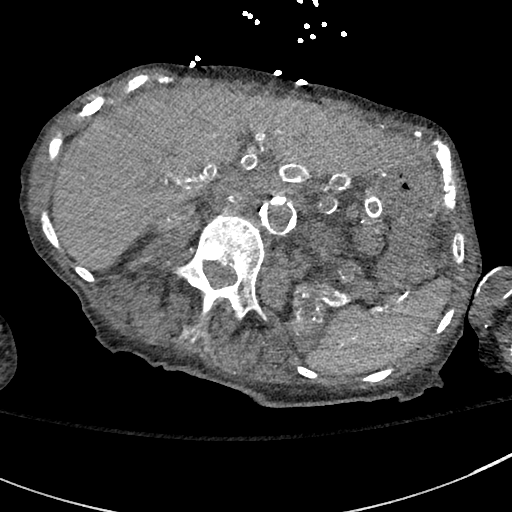
[im 12/157  lung]
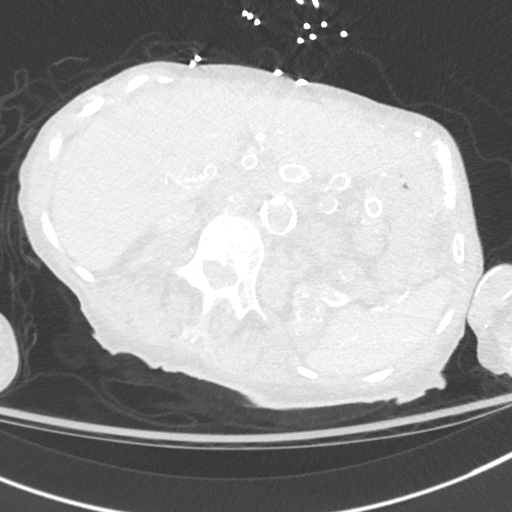
[im 24/157  lung]
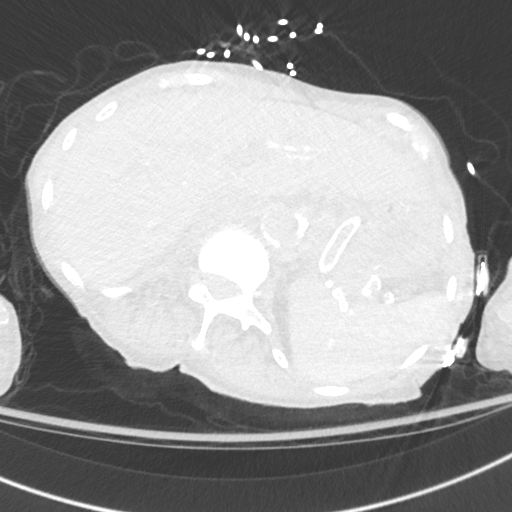
[im 35/157  lung]
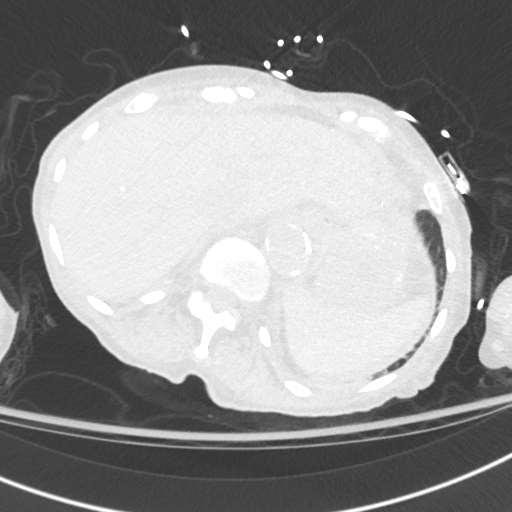
[im 47/157  lung]
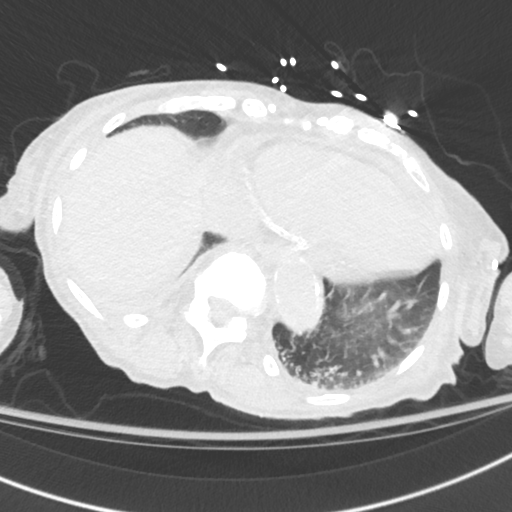
[im 58/157  mediastinal]
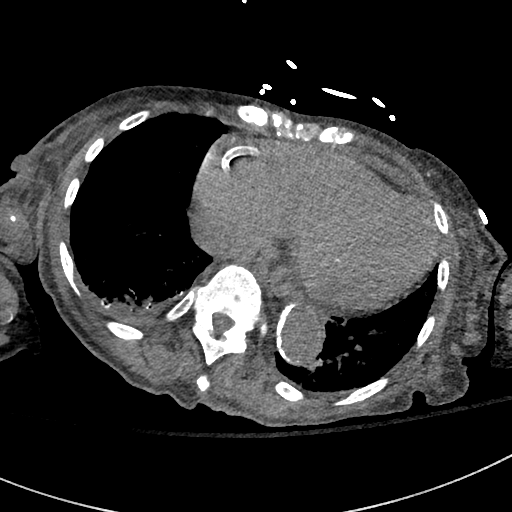
[im 58/157  lung]
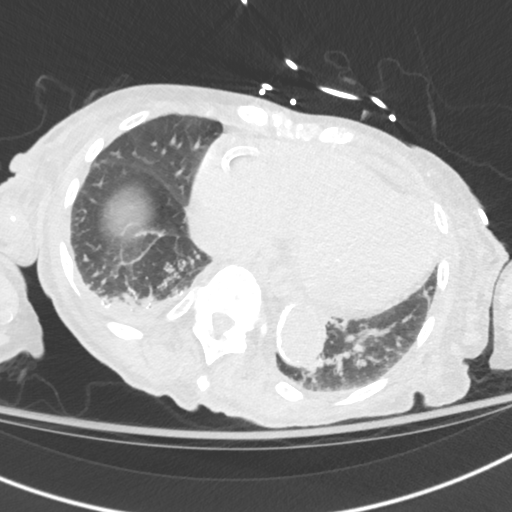
[im 70/157  lung]
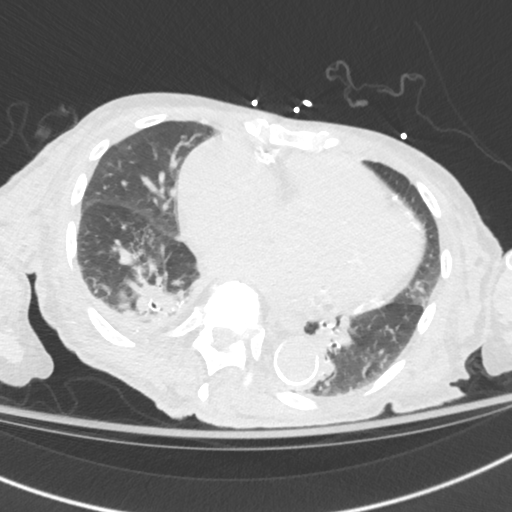
[im 87/157  lung]
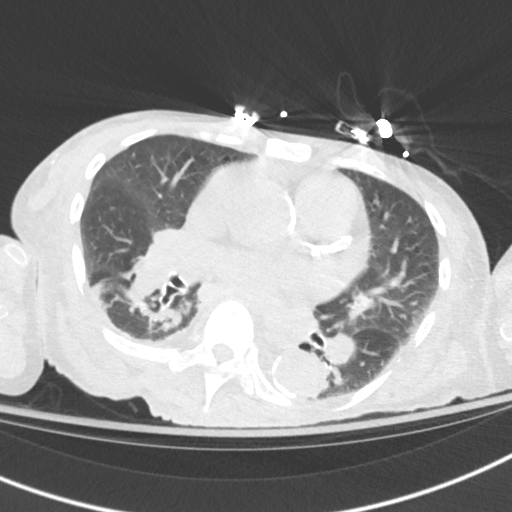
[im 99/157  lung]
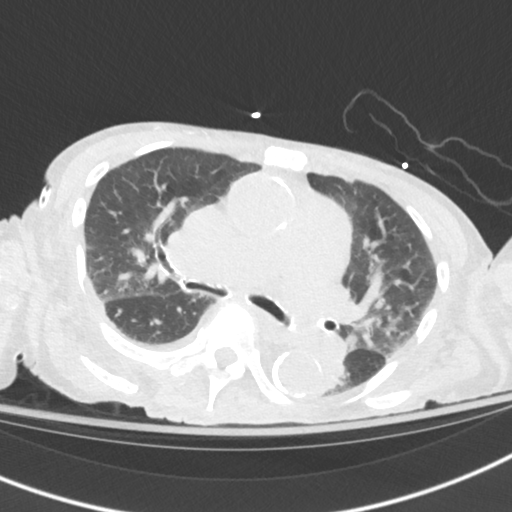
[im 110/157  mediastinal]
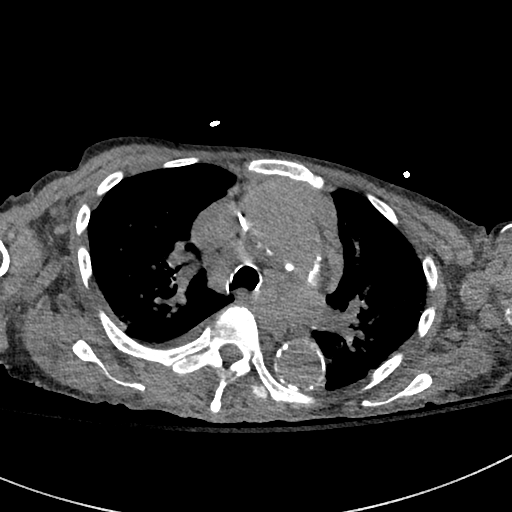
[im 110/157  lung]
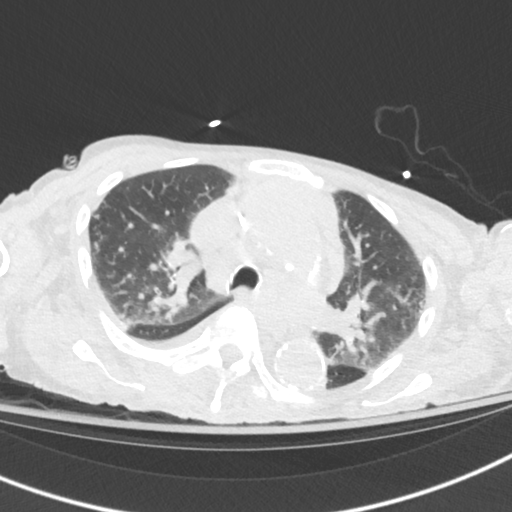
[im 122/157  lung]
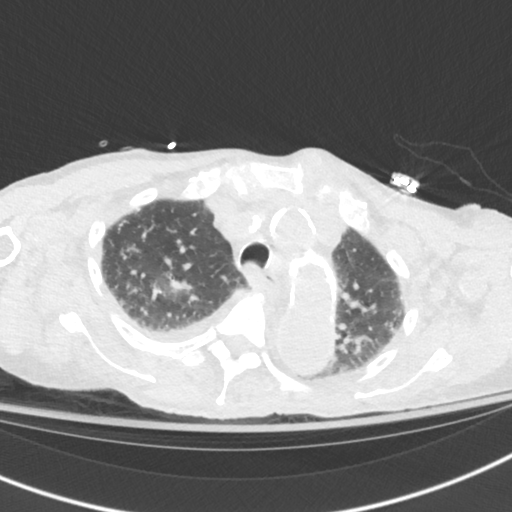
[im 133/157  lung]
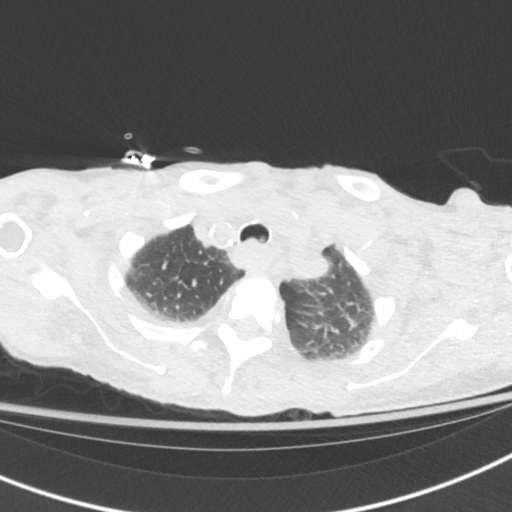
[im 145/157  lung]
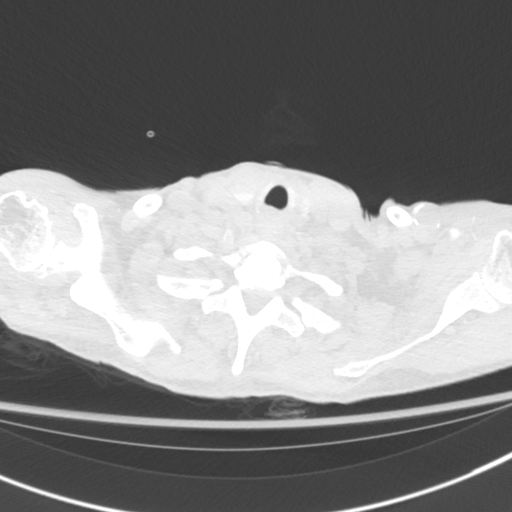

[Series 6: chest w/o 2mm st cor · coronal · non-contrast · 0.61mm/px · 3 of 119 slices shown]
[im 24/119  lung]
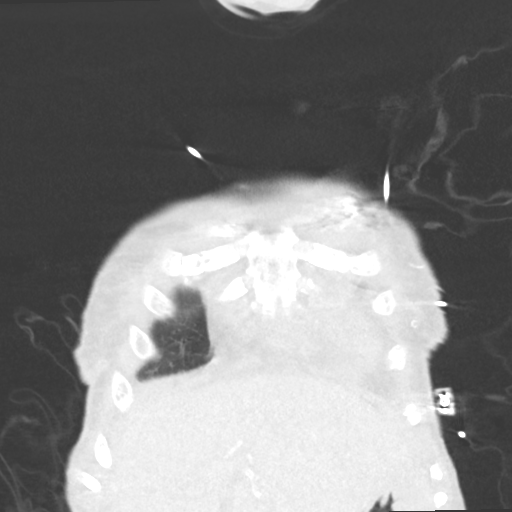
[im 48/119  lung]
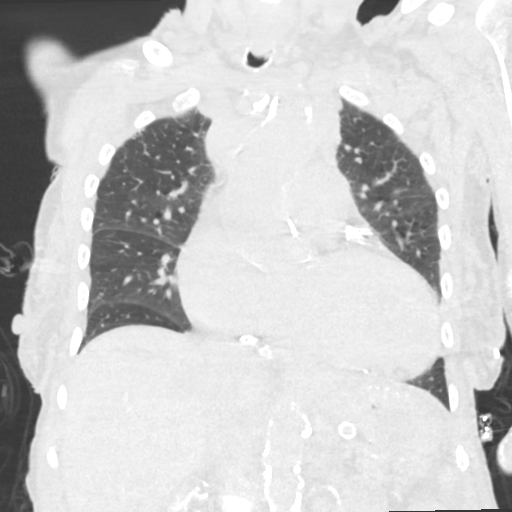
[im 71/119  lung]
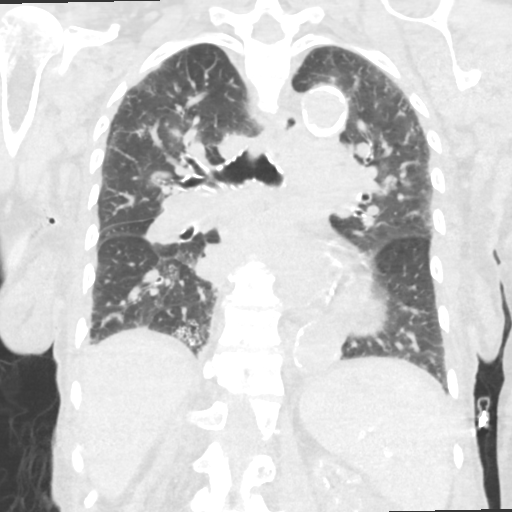

[15 of 36 positions shown; findings below may reference images not displayed]

FINDINGS: Cardiovascular: Unenhanced imaging of the heart demonstrates
cardiomegaly without significant pericardial effusion. 4.1 cm
ascending thoracic aortic aneurysm is unchanged. Evaluation of the
vascular lumen is limited without IV contrast. There is extensive
atherosclerosis of the aorta and coronary vasculature.

Mediastinum/Nodes: No enlarged mediastinal or axillary lymph nodes.
Thyroid gland, trachea, and esophagus demonstrate no significant
findings.

Lungs/Pleura: Trace right pleural effusion. Hypoventilatory changes
are seen at the lung bases, right greater than left. Chronic areas
of subpleural scarring are seen bilaterally. Likely aspirated barium
within the dependent aspects of the lower lobes, right greater than
left, stable. No pneumothorax. Central airways are patent.

Upper Abdomen: No acute abnormality.  Severe atherosclerosis.

Musculoskeletal: Diffuse changes of renal osteodystrophy. No acute
or destructive bony lesions. Patient is cachectic. Reconstructed
images demonstrate no additional findings.
IMPRESSION: 1. Trace right pleural effusion.
2. Minimal dependent atelectasis within the lower lobes. No acute
airspace disease.
3. Stable cardiomegaly without significant pericardial effusion.
4. 4.1 cm ascending thoracic aortic aneurysm, stable. Recommend
annual imaging followup by CTA or MRA. This recommendation follows
3101 ACCF/AHA/AATS/ACR/ASA/SCA/RAUFISHAQ/CARROLL/AUJLA/LANDI Guidelines for the
Diagnosis and Management of Patients with Thoracic Aortic Disease.
Circulation. 3101; 121: E266-e369. Aortic aneurysm NOS (LAA73-XIM.W)
5.  Aortic Atherosclerosis (LAA73-FQQ.Q).

## 2022-12-03 IMAGING — DX DG CHEST 1V PORT
1 series · 1 of 1 positions shown · non-contrast
Comparison: 02/08/2022

CLINICAL DATA: Short of breath, hypoxia

EXAM:
PORTABLE CHEST 1 VIEW

[chest]
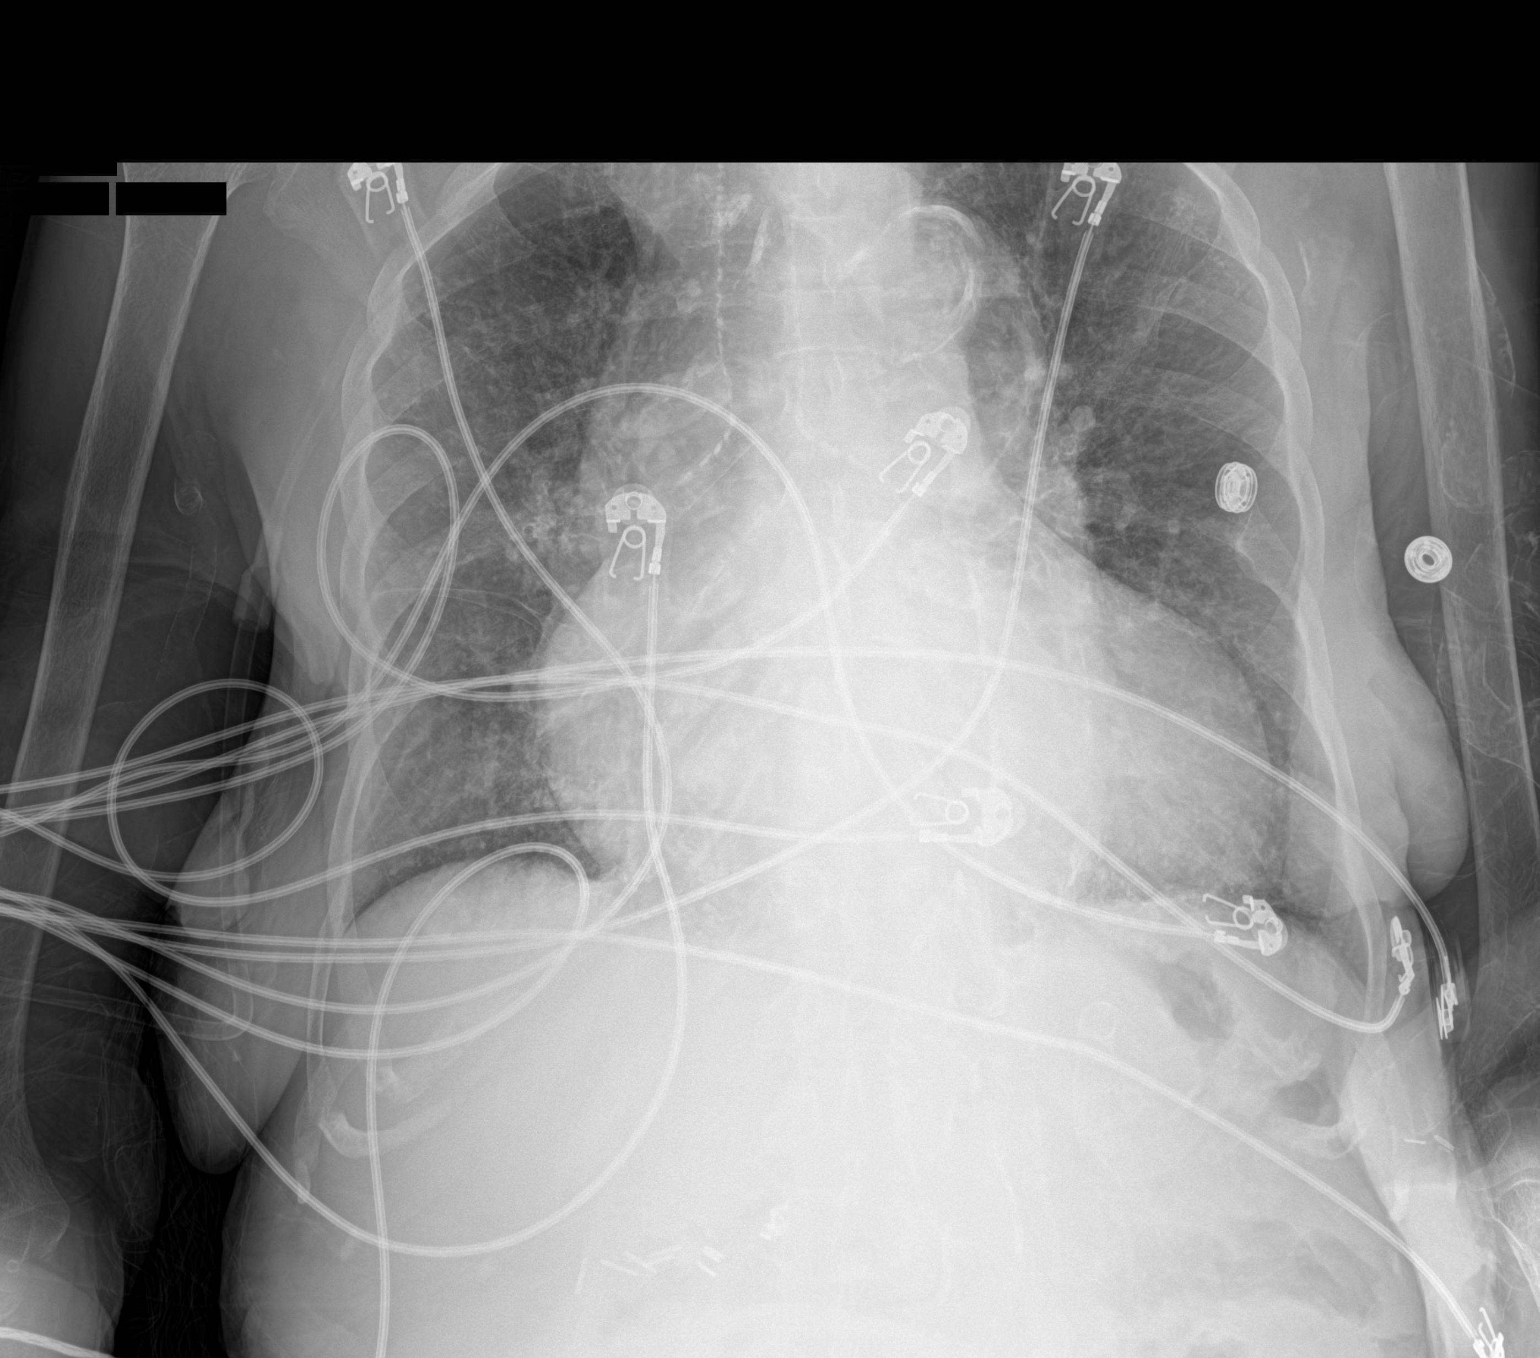

[1 of 1 positions shown; findings below may reference images not displayed]

FINDINGS: Single frontal view of the chest demonstrate stable marked
enlargement of the cardiac silhouette. Stable ectasia and
atherosclerosis of the thoracic aorta. Chronic central vascular
congestion without acute airspace disease, effusion, or
pneumothorax. No acute bony abnormality.
IMPRESSION: 1. Chronic cardiomegaly and central vascular congestion. No acute
airspace disease.

## 2022-12-04 IMAGING — DX DG CHEST 1V PORT
1 series · 1 of 1 positions shown · non-contrast
Comparison: Radiographs dated April 26, 2022

CLINICAL DATA: Central line placement.

EXAM:
PORTABLE CHEST 1 VIEW

[chest ap]
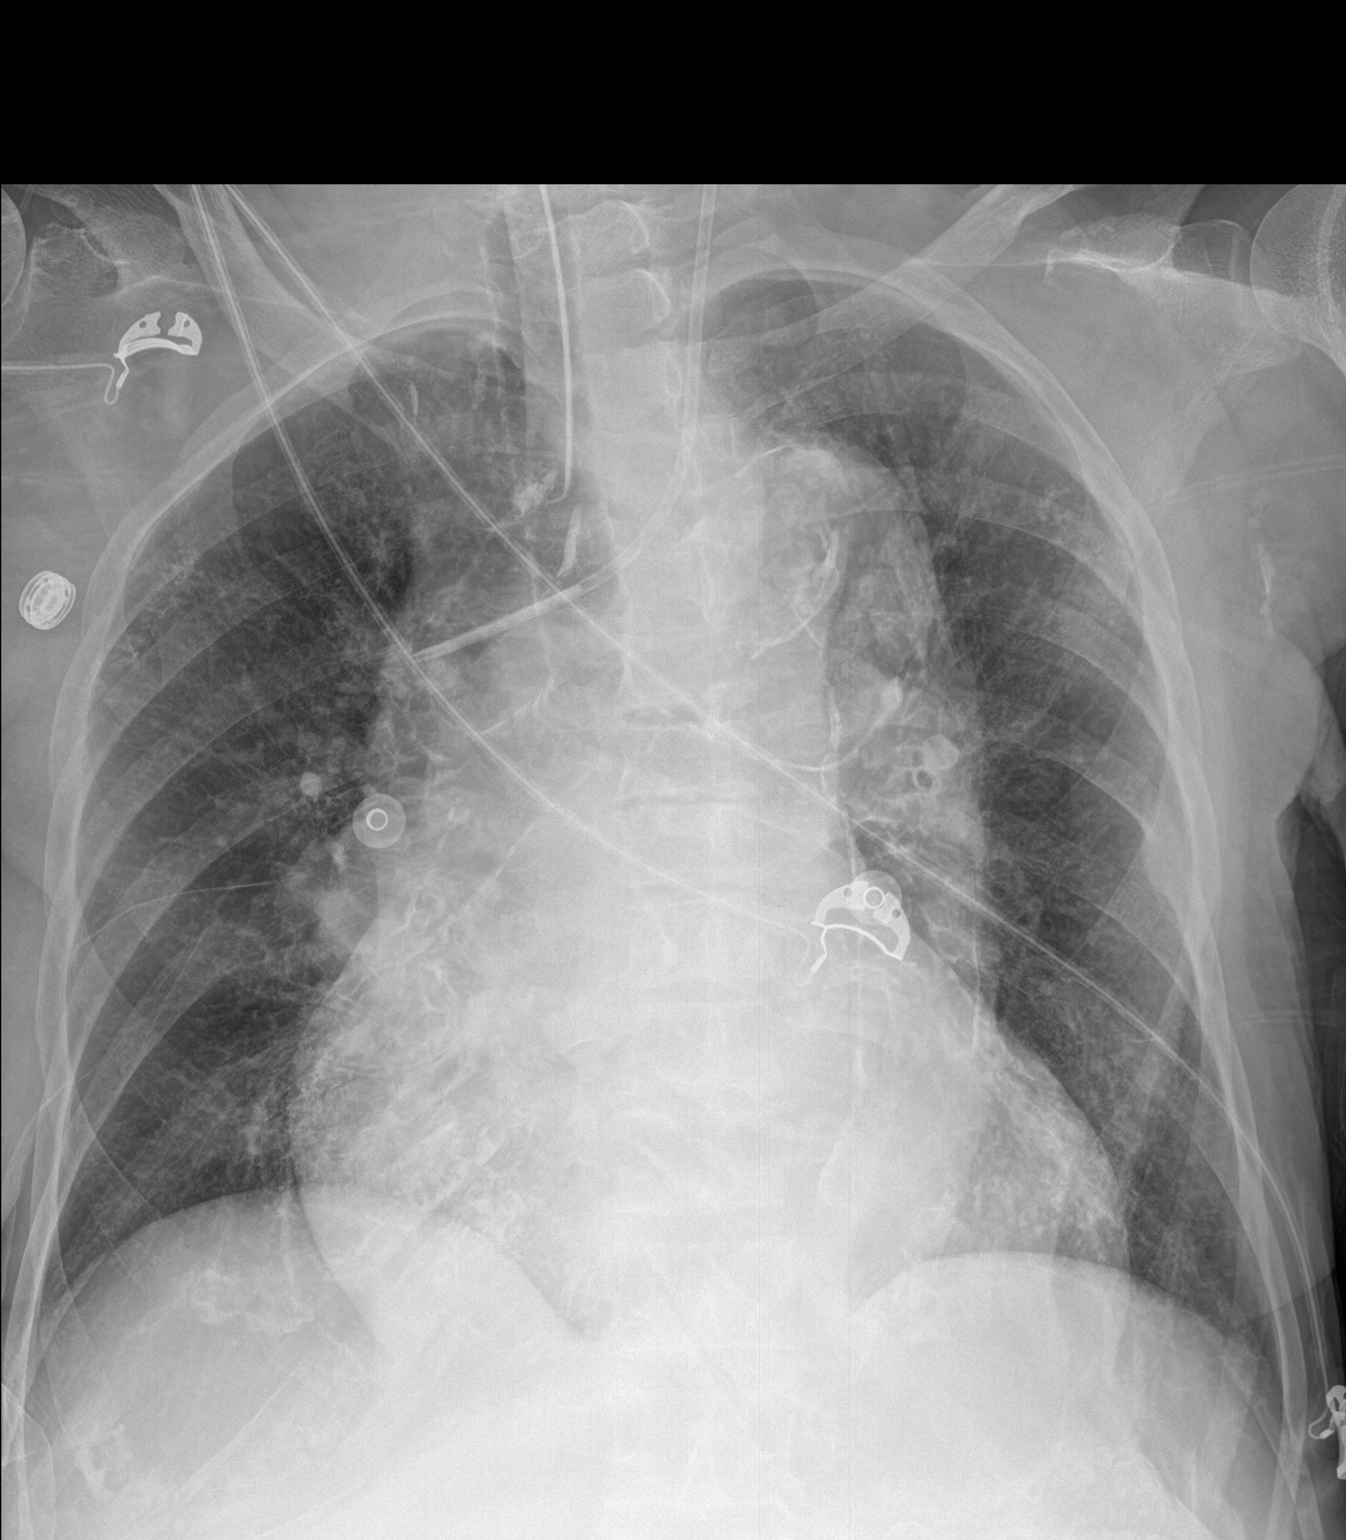

[1 of 1 positions shown; findings below may reference images not displayed]

FINDINGS: The heart is enlarged. Atherosclerotic calcification of the aortic
arch. Mild pulmonary vascular congestion. Lungs are clear without
evidence of focal consolidation or large pleural effusion.
Endotracheal tube with distal tip approximately 5 cm above the
carina. Left IJ access central line with distal tip at the junction
of the SVC and innominate vein.
IMPRESSION: 1. Stable cardiomegaly with mild pulmonary vascular congestion.
2. No focal consolidation or large pleural effusion.
3. Left IJ access central line with distal tip at the junction of
the SVC and innominate vein.

## 2022-12-13 IMAGING — DX DG CHEST 1V PORT
1 series · 1 of 1 positions shown · non-contrast
Comparison: Nine days ago

CLINICAL DATA: Shortness of breath

EXAM:
PORTABLE CHEST 1 VIEW

[chest]
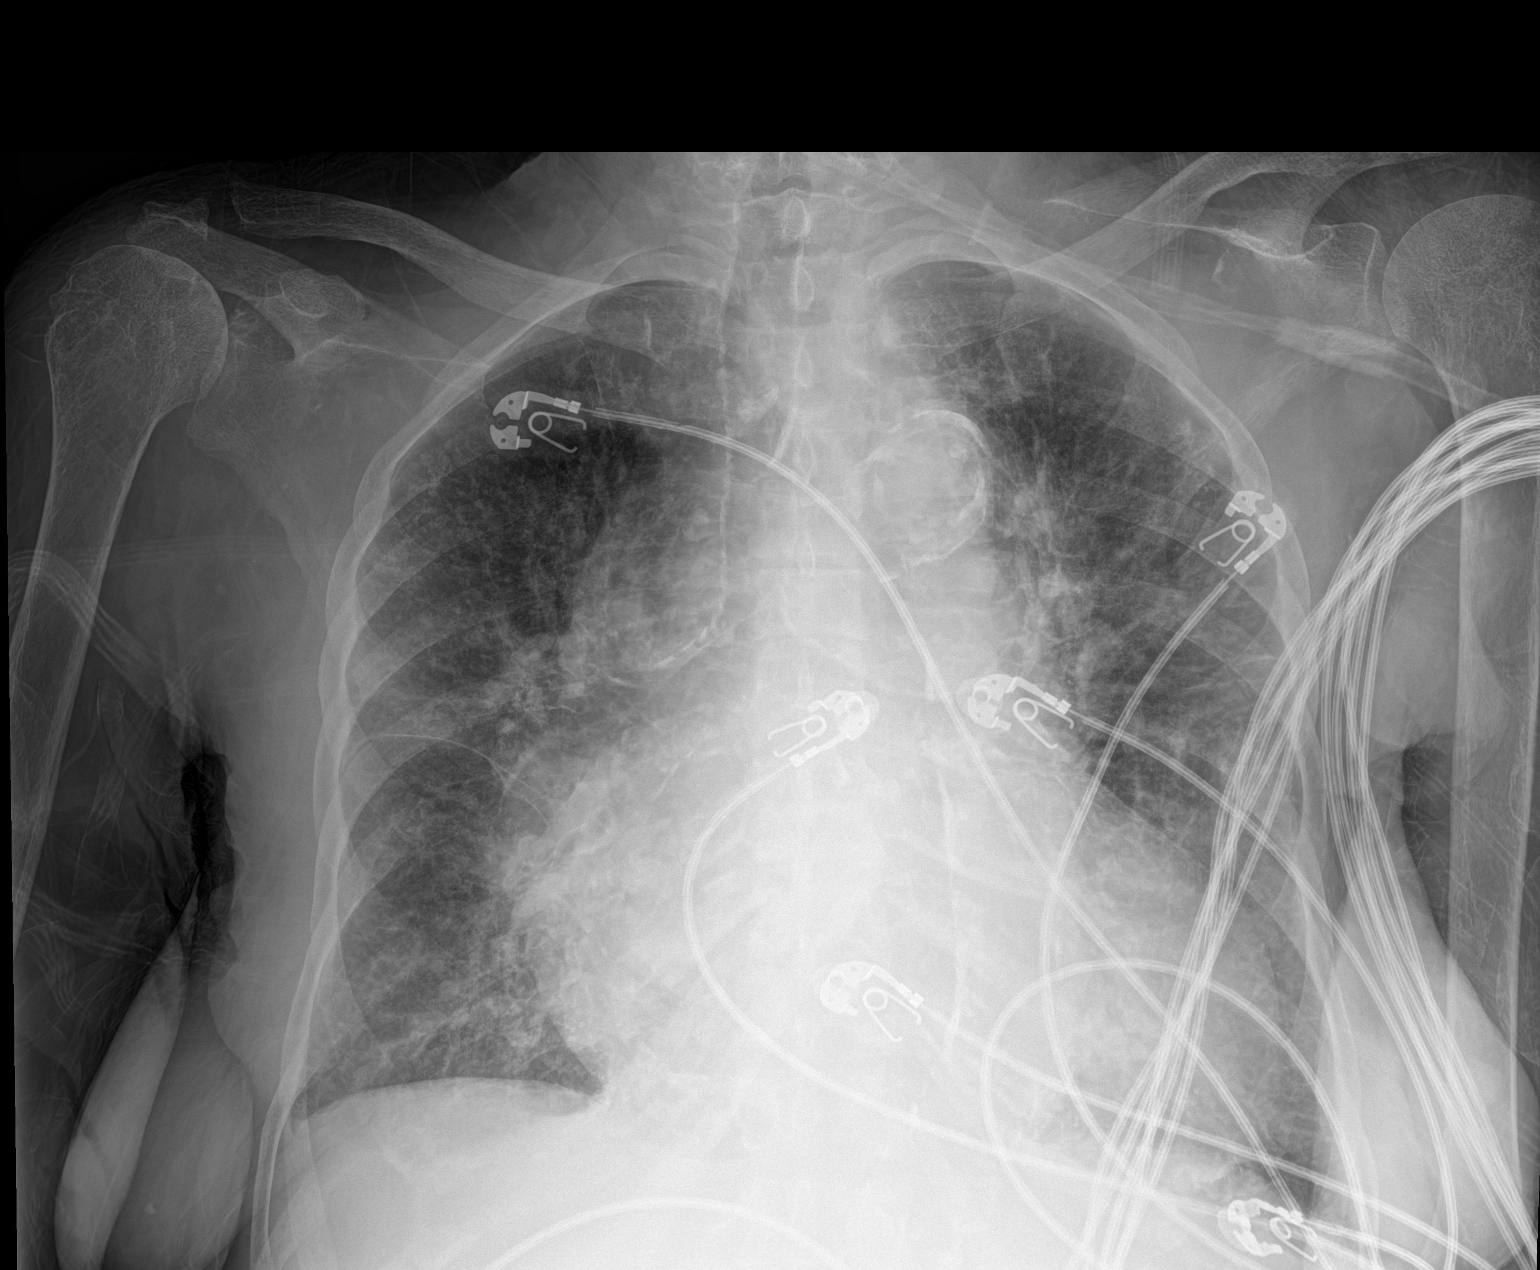

[1 of 1 positions shown; findings below may reference images not displayed]

FINDINGS: Cardiomegaly and vascular pedicle widening with diffuse interstitial
coarsening including Kerley lines. No visible effusion or
pneumothorax. Artifact from EKG leads.
IMPRESSION: CHF pattern.

## 2022-12-21 IMAGING — DX DG CHEST 1V PORT
1 series · 1 of 1 positions shown · non-contrast
Comparison: 05/06/2022

CLINICAL DATA: Shortness of breath

Chest pain
EXAM:
PORTABLE CHEST - 1 VIEW

[chest ap]
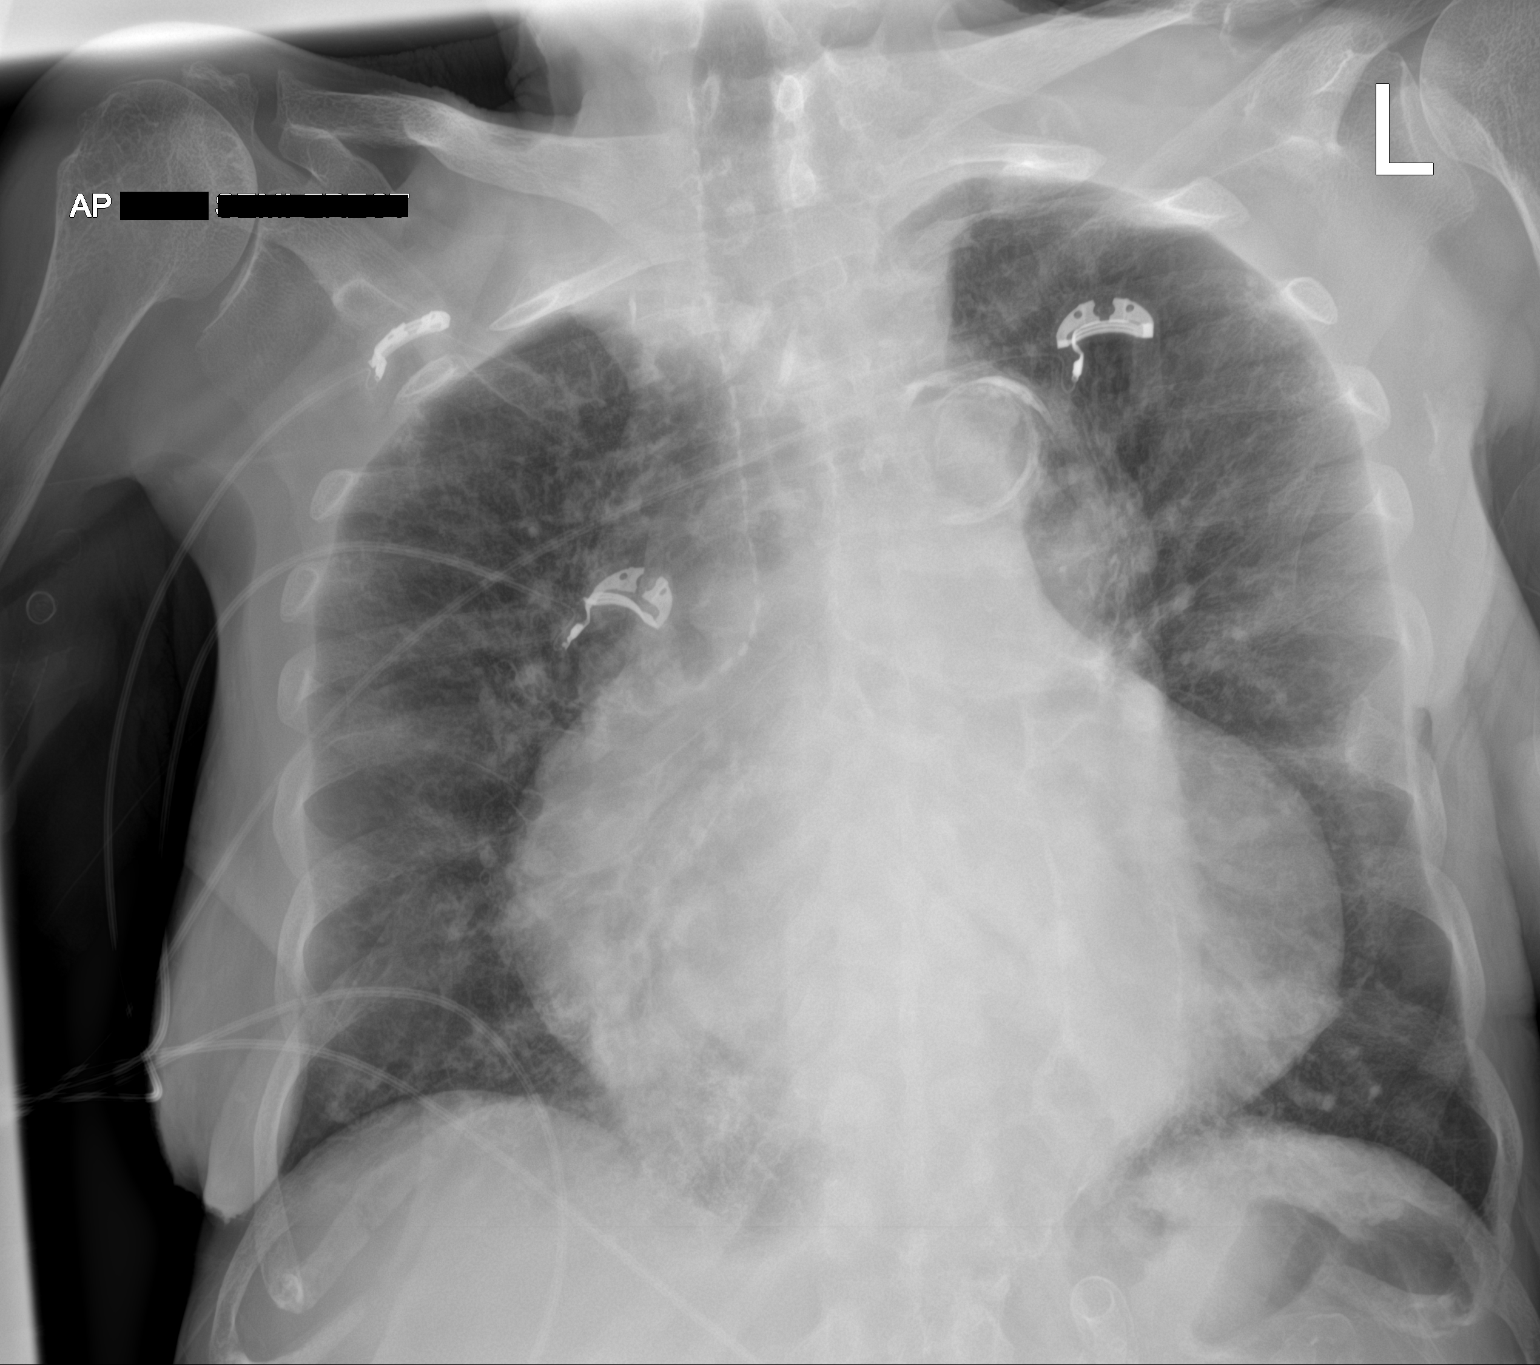

[1 of 1 positions shown; findings below may reference images not displayed]

FINDINGS: Unchanged cardiomegaly. Interval improvement of pulmonary vascular
congestion. Diffusely calcified thoracic aorta. The lungs are clear.
IMPRESSION: 1. Unchanged cardiomegaly.
2. Interval improvement of pulmonary vascular congestion.

## 2022-12-25 IMAGING — DX DG ABDOMEN 1V
1 series · 1 of 1 positions shown · non-contrast
Comparison: CT abdomen pelvis from 01/23/2022

CLINICAL DATA: 70-year-old female with abdominal pain.

EXAM:
ABDOMEN - 1 VIEW

[abdomen]
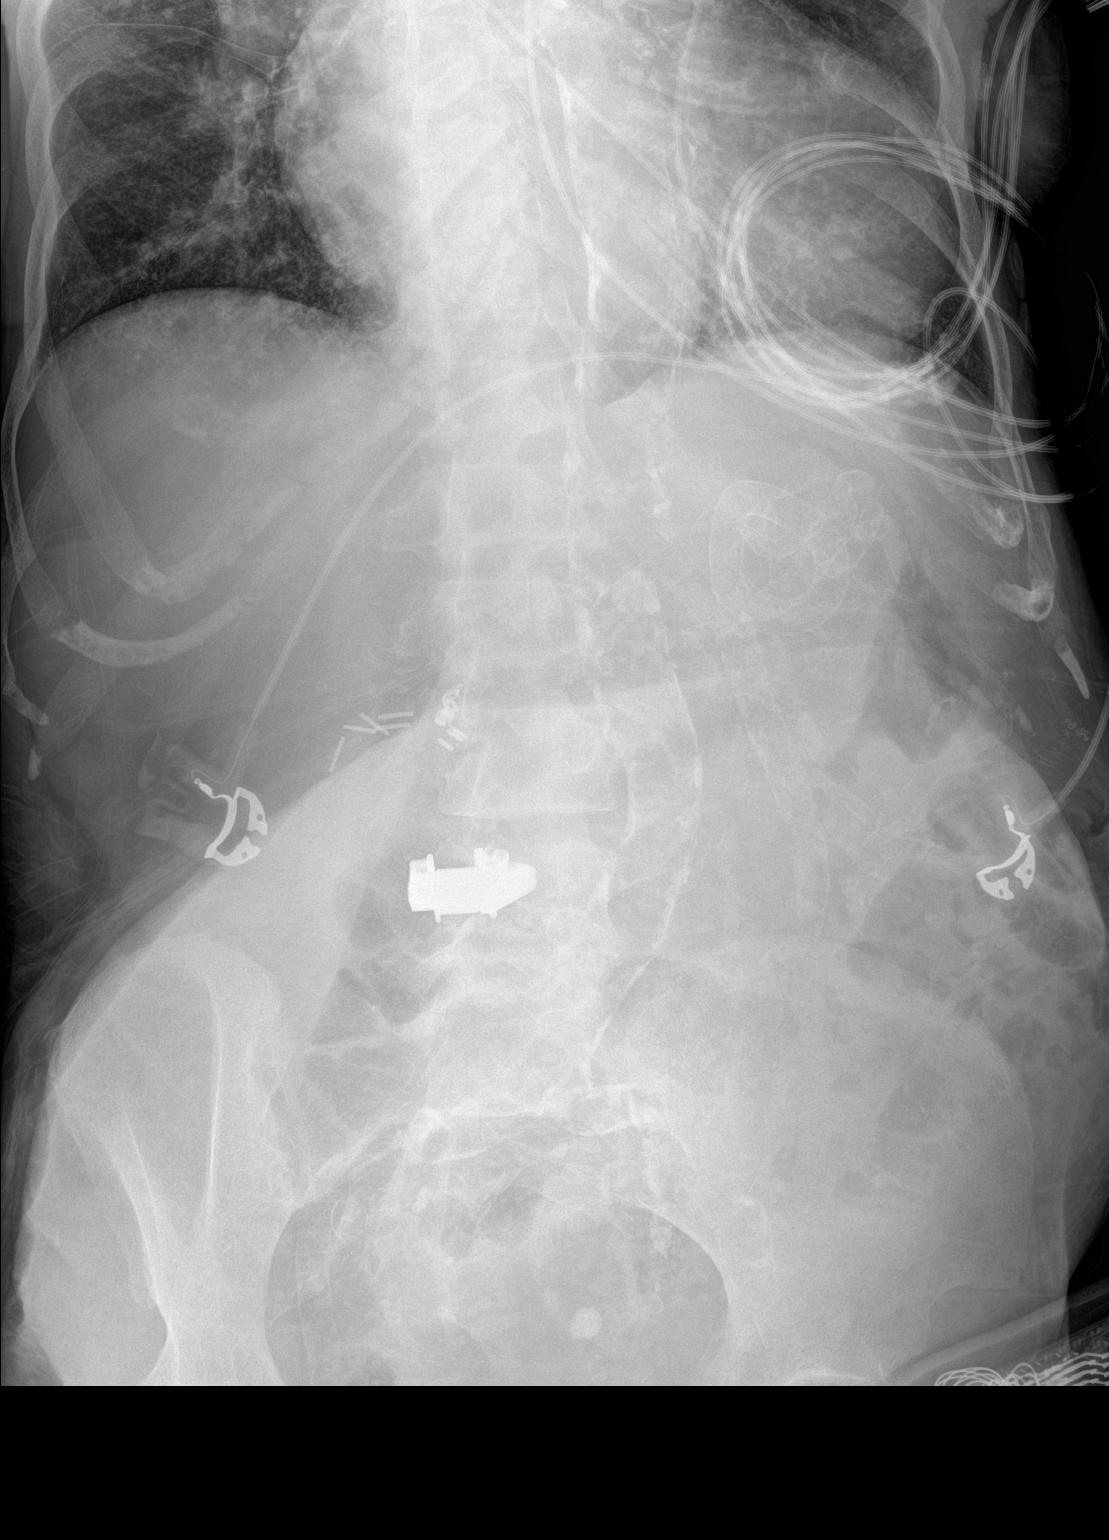

[1 of 1 positions shown; findings below may reference images not displayed]

FINDINGS: The bowel gas pattern is normal. Extensive atherosclerotic
calcifications. Surgical clips in the right upper quadrant.
IMPRESSION: 1. Nonobstructive bowel gas pattern.
2.  Aortic Atherosclerosis ([id number].A).

## 2022-12-27 IMAGING — CT CT HEAD W/O CM
3 series · 16 of 47 positions shown, 19 images · non-contrast
Comparison: CT brain 11/07/2021

CLINICAL DATA: Head trauma



[Series 4: head 5.0 h30s · axial · 0.40mm/px · z∈[+1047,+1172]mm · 10 of 31 slices shown, 13 images]
[im 3/31  brain]
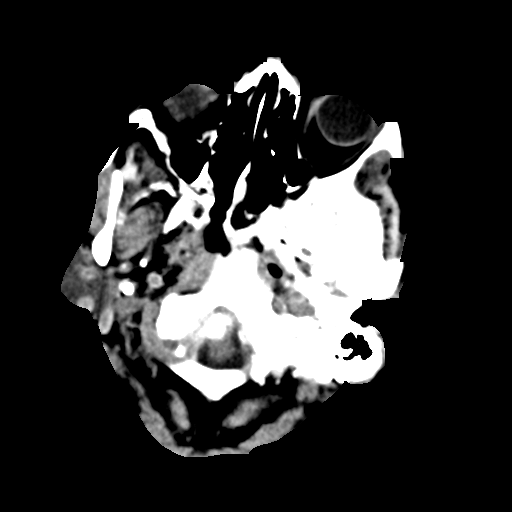
[im 3/31  bone]
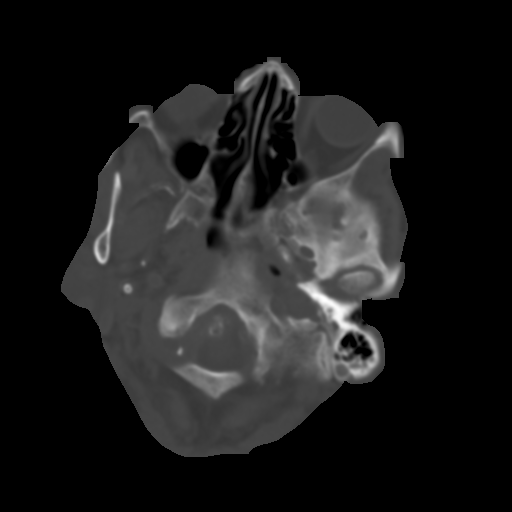
[im 6/31  brain]
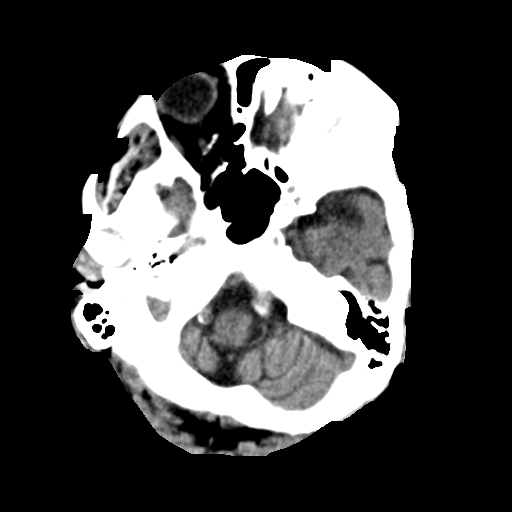
[im 9/31  brain]
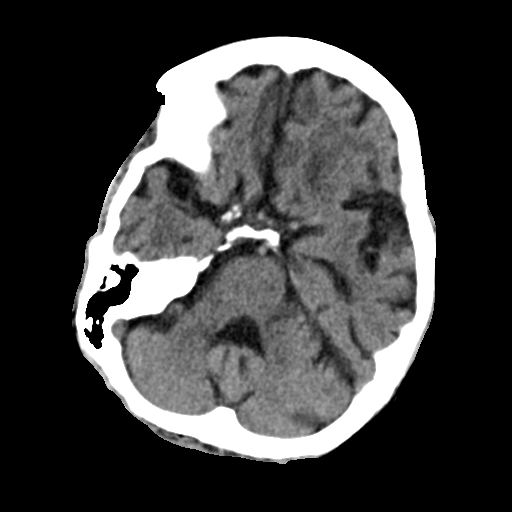
[im 11/31  brain]
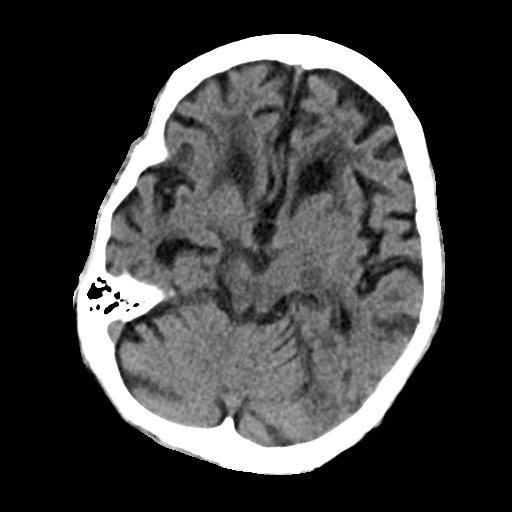
[im 14/31  brain]
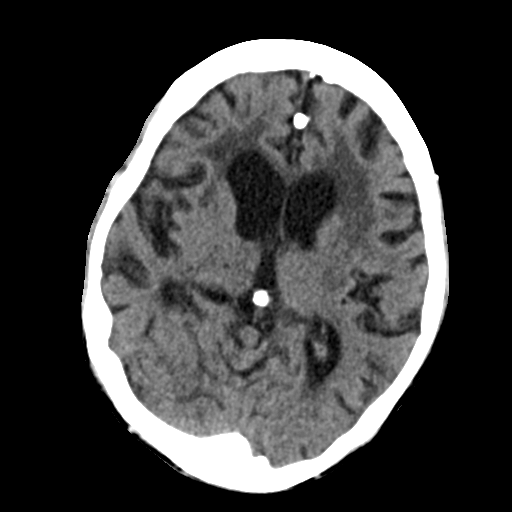
[im 14/31  bone]
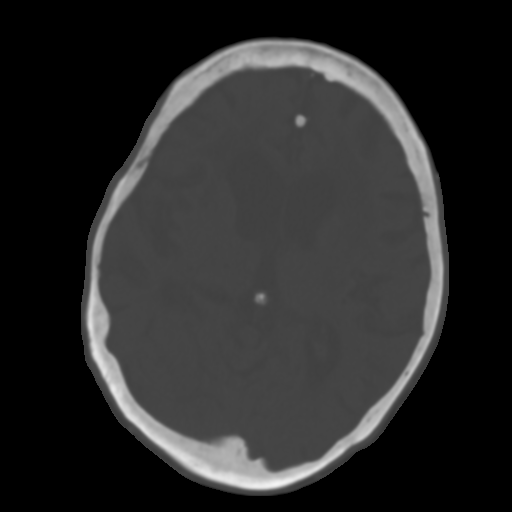
[im 17/31  brain]
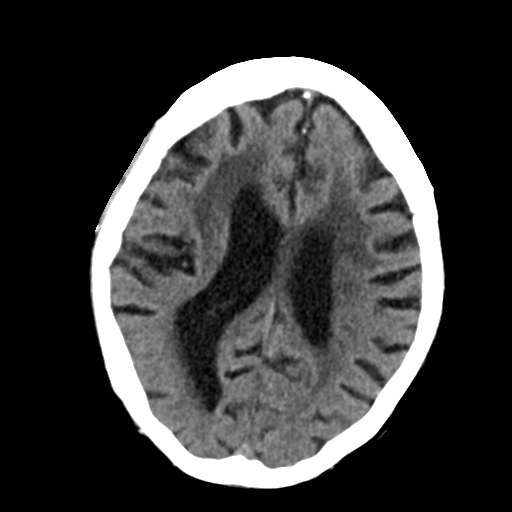
[im 20/31  brain]
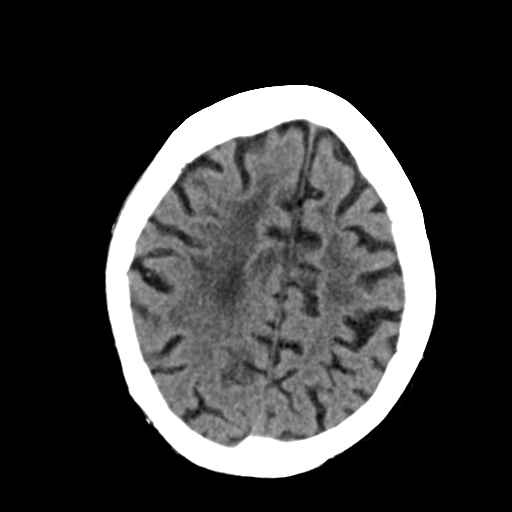
[im 23/31  brain]
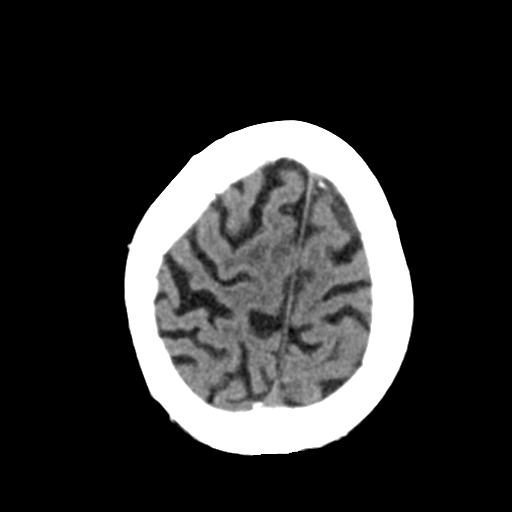
[im 25/31  brain]
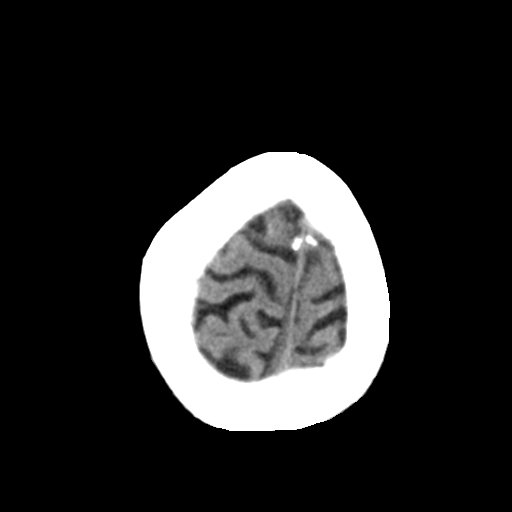
[im 25/31  bone]
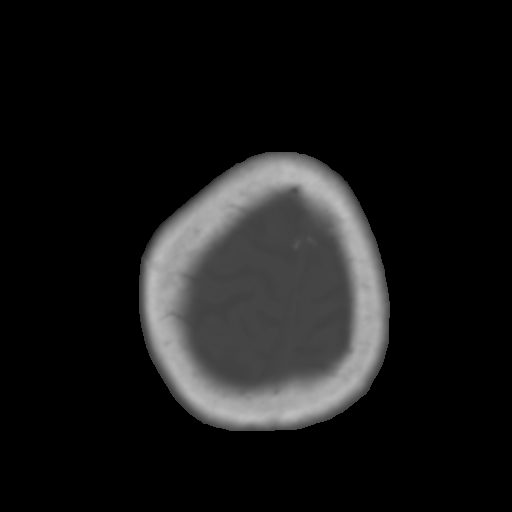
[im 28/31  brain]
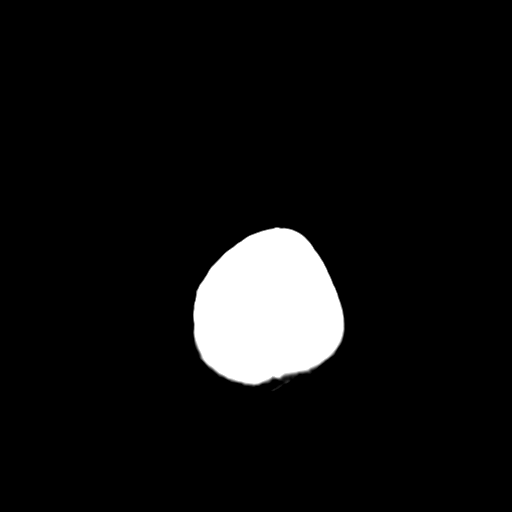

[Series 5: head 3.0 mpr cor · coronal · 0.32mm/px · 3 of 68 slices shown]
[im 23/68  brain]
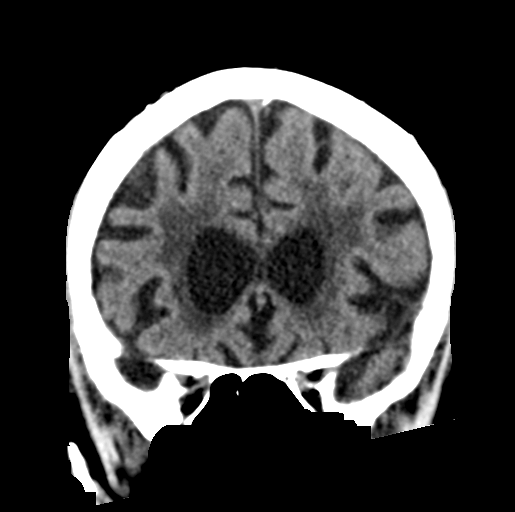
[im 30/68  brain]
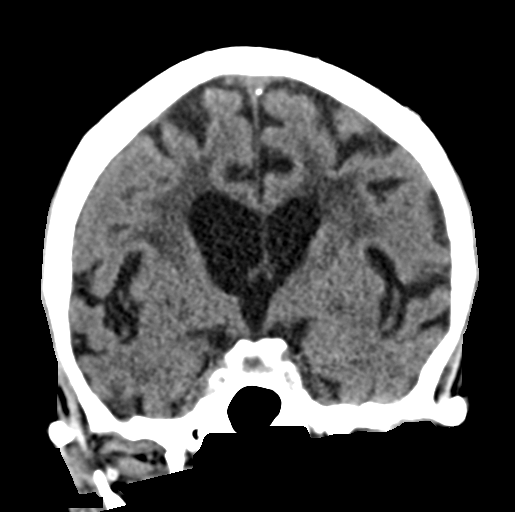
[im 38/68  brain]
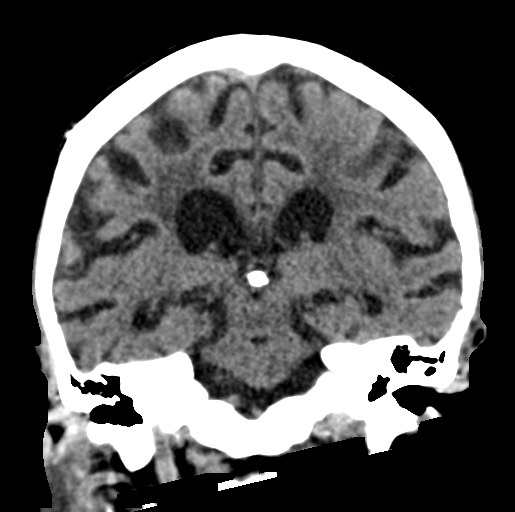

[Series 6: head 3.0 mpr sag · sagittal · 0.34mm/px · 3 of 53 slices shown]
[im 21/53  brain]
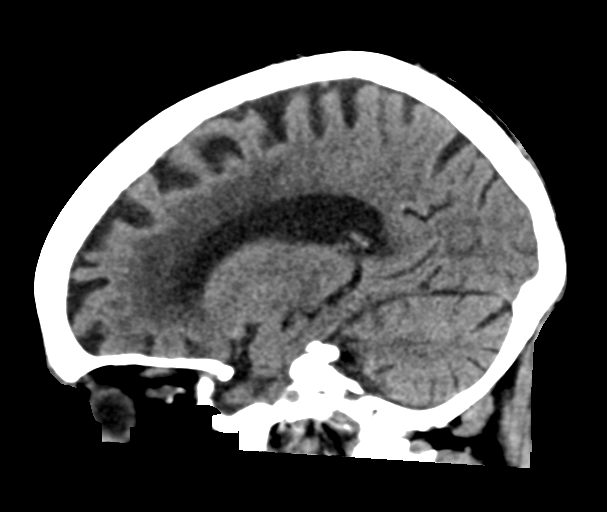
[im 27/53  brain]
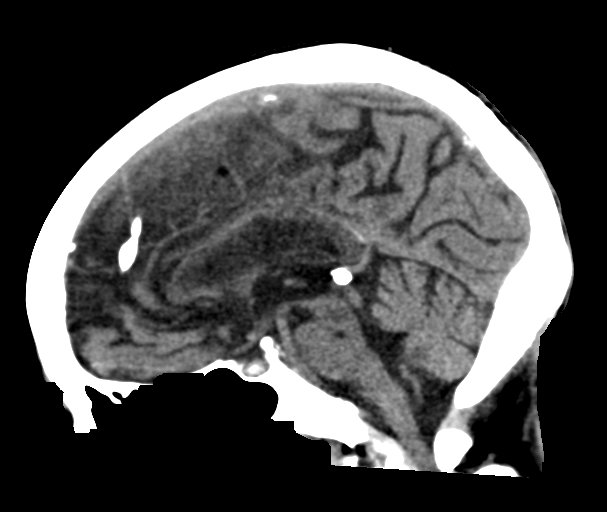
[im 33/53  brain]
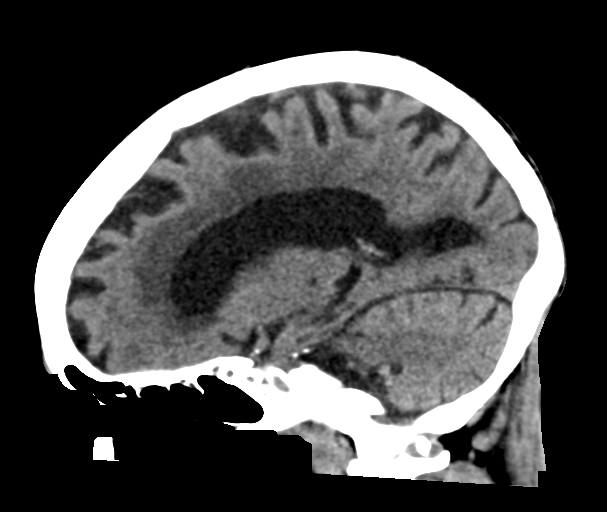

[16 of 47 positions shown; findings below may reference images not displayed]

FINDINGS: Brain: No acute territorial infarction, hemorrhage or intracranial
mass. Advanced atrophy. Advanced chronic small vessel ischemic
changes of the white matter. Stable ventricle size

Vascular: No hyperdense vessels. Vertebral and carotid vascular
calcification.

Skull: Normal. Negative for fracture or focal lesion.

Sinuses/Orbits: No acute finding.

Other: Increased atlantal axial interval up to 7 mm but appears
chronic as compared with Abd Khalak CT. This could be secondary to
inflammatory arthropathy given inflammatory changes at the tip of
the dens.
IMPRESSION: 1. No CT evidence for acute intracranial abnormality.
2. Atrophy and chronic small vessel ischemic changes of the white
matter

## 2023-01-13 ENCOUNTER — Telehealth: Payer: Self-pay | Admitting: Neurology

## 2023-01-13 NOTE — Telephone Encounter (Signed)
Pt has passed away as of .

## 2023-01-14 NOTE — Telephone Encounter (Signed)
Condolence card mailed to home address on file.

## 2023-01-14 DEATH — deceased

## 2023-01-20 ENCOUNTER — Inpatient Hospital Stay: Payer: Self-pay | Admitting: Neurology

## 2023-01-27 NOTE — Progress Notes (Deleted)
No chief complaint on file.  History of Present Illness: 72 yo female with history of CAD (seen on chest CT, no prior cath), HTN, HLD, TIA, ESRD on HD, chronic diastolic CHF and anemia here today for cardiac follow up. She was hospitalized in April 2021 for placement of a RUE fistula, post-surgical course was complicated by shock/hypotension and hyperkalemia resulting in sustained VT for which she was cardioverted to sinus. Echo at that time showed a new wall motion abnormality which was felt to be a possible stress cardiomyopathy in the setting of hypotension and recent surgery. She was subsequently admitted later that month with chest pain and SOB. Troponins were mildly elevated at that time to the 50s. Nuclear stress test March 2021 with no ischemia. Echo April 2021 with LVEF=50-55%, G1DD, distal anteroseptal and apical wall motion abnormality, moderate to severe TR. She was admitted to [facility] June 2021 with chest pain. Troponin elevated to 2900 but her chest pain resolved with fluid removal. She refused to consider a cardiac cath or cardiac CTA. Possible TIA in March 2022. Head MRI negative for stroke. Echo March 2022 with LVEF=60-65% with apical hypokinesis, no LV thrombus with contrast, mild MR. She was admitted to [facility] multiple times in 2023 with infection. During her admission in May 2023 her troponin was elevated. She was seen by the cardiology consult team. Echo May 2023 with LVEF=50-55%. Severe pulmonary HTN, severe TR.   She is here today for follow up. The patient denies any chest pain, dyspnea, palpitations, lower extremity edema, orthopnea, PND, dizziness, near syncope or syncope.   Primary Care Physician: [name], MD  Past Medical History:  Diagnosis Date   Acute ischemic stroke ([place]) 02/23/2017   Acute on chronic diastolic CHF (congestive heart failure) ([place])    Adenomatous polyp of colon 10/2010, 2006, 2015   Anemia in CKD (chronic kidney disease) 11/07/2012   s/p blood  transfusion.    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  Had some early rejection then stable function for years, then had slow decline of function and went back on hemodialysis in 2012.  Gets HD TTS schedule at [facility] still using L forearm AVF.   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    Past Surgical History:  Procedure Laterality Date   ABDOMINAL AORTOGRAM W/LOWER EXTREMITY N/A 03/26/2022   Procedure: ABDOMINAL AORTOGRAM W/LOWER EXTREMITY;  Surgeon: Marty Heck, MD;  Location: [place] [facility];  Service: Cardiovascular;  Laterality: N/A;  Left   AV FISTULA PLACEMENT     for dialysis   AV FISTULA PLACEMENT Left 11/22/2015   Procedure: ARTERIOVENOUS (AV) FISTULA CREATION-LEFT BRACHIOCEPHALIC;  Surgeon: Serafina Mitchell, MD;  Location: [place];  Service: Vascular;  Laterality: Left;   AV FISTULA PLACEMENT Right 03/15/2020   Procedure: INSERTION OF ARTERIOVENOUS (AV) GORE-TEX GRAFT  ARM ( BRACHIAL AXILLARY );  Surgeon: Katha Cabal, MD;  Location: [facility];  Service: Vascular;  Laterality: Right;   AV FISTULA PLACEMENT Right 05/15/2022   Procedure: EXPLORATION  OF  RIGHT ARM ARTERIOVENOUS GORE-TEX GRAFT;  Surgeon: Angelia Mould, MD;  Location: [place];  Service: Vascular;  Laterality: Right;   [place] REMOVAL Right 04/27/2022   Procedure: REVISION OF RIGHT ARTERIOVENOUS GRAFT AND EXCISION OF  RIGHT DEGENERATED GRAFT (AVG);  Surgeon: Angelia Mould, MD;  Location: [place];  Service: Vascular;  Laterality: Right;   BACK SURGERY     CERVICAL FUSION     CHOLECYSTECTOMY  12/02/2012   Procedure: LAPAROSCOPIC CHOLECYSTECTOMY WITH INTRAOPERATIVE CHOLANGIOGRAM;  Surgeon: Edward Jolly, MD;  Location: [place];  Service: General;  Laterality: N/A;   EYE SURGERY Bilateral    cataract surgery   EYE SURGERY Left 2019   laser   HEMATOMA EVACUATION Left 12/24/2016   Procedure: EVACUATION HEMATOMA LEFT UPPER ARM;  Surgeon: Waynetta Sandy, MD;  Location: [place];  Service: Vascular;  Laterality: Left;   I & D EXTREMITY Left 12/31/2016   Procedure: IRRIGATION AND DEBRIDEMENT EXTREMITY;  Surgeon: Angelia Mould, MD;  Location: [place];  Service: Vascular;  Laterality: Left;   INSERTION OF DIALYSIS CATHETER Right 12/24/2016   Procedure: INSERTION OF DIALYSIS CATHETER;  Surgeon: Waynetta Sandy, MD;  Location: [place];  Service: Vascular;  Laterality: Right;   INSERTION OF DIALYSIS CATHETER Right 02/04/2017   Procedure: INSERTION OF DIALYSIS CATHETER;  Surgeon: Waynetta Sandy, MD;  Location: [place];  Service: Vascular;  Laterality: Right;   [place] Left 10/23/2016   Procedure: Fistulagram;  Surgeon: Elam Dutch, MD;  Location: [place] [facility];  Service: Cardiovascular;  Laterality: Left;   PERIPHERAL VASCULAR INTERVENTION  03/26/2022   Procedure: PERIPHERAL VASCULAR INTERVENTION;  Surgeon: Marty Heck, MD;  Location: [place] [facility];  Service: Cardiovascular;;  left sfa   PERIPHERAL VASCULAR THROMBECTOMY Right 04/16/2020   Procedure: PERIPHERAL VASCULAR THROMBECTOMY;  Surgeon: Katha Cabal, MD;  Location: [place] [facility];  Service: Cardiovascular;  Laterality: Right;   RESECTION OF ARTERIOVENOUS FISTULA ANEURYSM Left 11/22/2015   Procedure: RESECTION OF LEFT RADIOCEPHALIC FISTULA  ANEURYSM ;  Surgeon: Serafina Mitchell, MD;  Location: [place] [facility];  Service: Vascular;  Laterality: Left;   REVISON OF ARTERIOVENOUS FISTULA Left 12/22/2016   Procedure: REVISON OF LEFT ARTERIOVENOUS FISTULA;  Surgeon: Waynetta Sandy, MD;  Location: [place];  Service: Vascular;  Laterality: Left;   REVISON OF ARTERIOVENOUS FISTULA Left 02/04/2017   Procedure: REVISON OF LEFT UPPER ARM ARTERIOVENOUS FISTULA;  Surgeon: Waynetta Sandy, MD;  Location: [place];  Service: Vascular;  Laterality: Left;   UPPER EXTREMITY ANGIOGRAPHY Bilateral 09/19/2019   Procedure: UPPER EXTREMITY ANGIOGRAPHY;  Surgeon: Katha Cabal, MD;  Location: [place] [facility];  Service: Cardiovascular;  Laterality: Bilateral;    Current Outpatient Medications  Medication Sig Dispense Refill   albuterol (PROVENTIL) (2.5 MG/3ML) 0.083% nebulizer solution Take 3 mLs (2.5 mg total) by nebulization every 4 (four) hours as needed for wheezing. (Patient taking differently: Take 2.5 mg by nebulization every 4 (four) hours as needed for wheezing or shortness of breath.) 75 mL 12   albuterol (VENTOLIN HFA) 108 (90 Base) MCG/ACT inhaler Inhale 1-2 puffs into the lungs every 6 (six) hours as needed for wheezing or shortness of breath. (Patient taking differently: Inhale 2 puffs  into the lungs every 6 (six) hours as needed for wheezing or shortness of breath.) 1 each 0   atorvastatin (LIPITOR) 80 MG tablet Take 80 mg by mouth at bedtime.     B Complex-C-Folic Acid (DIALYVITE Q000111Q) 0.8 MG WAFR Take 1 tablet by mouth every morning.     benzonatate (TESSALON) 200 MG capsule Take 1 capsule (200 mg total) by mouth 2 (two) times daily as needed for cough. 20 capsule 0   cinacalcet (SENSIPAR) 30 MG tablet Take 1 tablet (30 mg total) by mouth every Monday, Wednesday, and Friday with hemodialysis. 60 tablet    clopidogrel (PLAVIX) 75 MG tablet Take 1 tablet (75 mg total) by mouth daily. 30 tablet 11   Dextromethorphan-guaiFENesin 20-400 MG  TABS Take 1 tablet by mouth in the morning and at bedtime.     escitalopram (LEXAPRO) 10 MG tablet Take 1 tablet (10 mg total) by mouth every morning. (Patient taking differently: Take 10 mg by mouth daily.) 8 tablet 0   hydrocortisone (ANUSOL-HC) 2.5 % rectal cream Apply 1 Application topically 4 (four) times daily as needed for hemorrhoids. (Patient taking differently: Apply 1 Application topically every 6 (six) hours as needed for hemorrhoids.) 30 g 0   hydrOXYzine (ATARAX) 10 MG tablet Take 1 tablet (10 mg total) by mouth 3 (three) times daily as needed for itching. (Patient taking differently: Take 10 mg by mouth every 8 (eight) hours as needed for itching.) 30 tablet 0   Lido-PE-Glycerin-Petrolatum (PREPARATION H RAPID RELIEF) 5-0.25-14.4-15 % CREA Apply 1 Application topically every 6 (six) hours as needed (hemorrhoids).     midodrine (PROAMATINE) 10 MG tablet Take 1 tablet (10 mg total) by mouth 3 (three) times daily with meals.     Nutritional Supplements (NUTRITIONAL SUPPLEMENT PO) Take 1 Container by mouth 2 (two) times daily. AHR-Frozen Nutritional Treat     pantoprazole (PROTONIX) 40 MG tablet Take 1 tablet (40 mg total) by mouth daily. 60 tablet    sevelamer carbonate (RENVELA) 800 MG tablet Take 1 tablet (800 mg total) by mouth 3 (three) times daily with meals.     thiamine (VITAMIN B1) 100 MG tablet Take 1 tablet (100 mg total) by mouth daily.     No current facility-administered medications for this visit.    Allergies  Allergen Reactions   Sulfa Antibiotics Other (See Comments)    Both pt's parents allergic to this type of medication    Adhesive [Tape] Itching    Social History   Socioeconomic History   Marital status: Widowed    Spouse name: Not on file   Number of children: 2   Years of education: Not on file   Highest education level: Not on file  Occupational History   Not on file  Tobacco Use   Smoking status: Former    Types: Cigarettes    Quit date:  12/31/1991    Years since quitting: 31.0    Passive exposure: Never   Smokeless tobacco: Never  Vaping Use   Vaping Use: Never used  Substance and Sexual Activity   Alcohol use: No    Alcohol/week: 0.0 standard drinks of alcohol   Drug use: No   Sexual activity: Not Currently    Birth control/protection: Post-menopausal  Other Topics Concern   Not on file  Social History Narrative   Living with her mother    Right handed   Caffeine: only decaf clears ("no brown sodas" or coffee d/t kidney disease)   Low potassium foods  d/t kidney disease   Social Determinants of Health   Financial Resource Strain: Not on file  Food Insecurity: Not on file  Transportation Needs: Not on file  Physical Activity: Not on file  Stress: Not on file  Social Connections: Not on file  Intimate Partner Violence: Not on file    Family History  Problem Relation Age of Onset   Colon cancer Brother    Cancer Brother    Coronary artery disease Mother 9   Hyperlipidemia Mother    Hypertension Mother    Stroke Maternal Aunt    Esophageal cancer Neg Hx    Stomach cancer Neg Hx    Rectal cancer Neg Hx     Review of Systems:  As stated in the HPI and otherwise negative.   There were no vitals taken for this visit.  Physical Examination:  General: Well developed, well nourished, NAD  HEENT: OP clear, mucus membranes moist  SKIN: warm, dry. No rashes. Neuro: No focal deficits  Musculoskeletal: Muscle strength 5/5 all ext  Psychiatric: Mood and affect normal  Neck: No JVD, no carotid bruits, no thyromegaly, no lymphadenopathy.  Lungs:Clear bilaterally, no wheezes, rhonci, crackles Cardiovascular: Regular rate and rhythm. No murmurs, gallops or rubs. Abdomen:Soft. Bowel sounds present. Non-tender.  Extremities: No lower extremity edema. Pulses are 2 + in the bilateral DP/PT.  EKG:  EKG is not  *** ordered today. The ekg ordered today demonstrates   Echo May 2023:  1. No left ventricular  thrombus is seen. Left ventricular ejection  fraction, by estimation, is 50 to 55%. The left ventricle has low normal  function. The left ventricle demonstrates regional wall motion  abnormalities (see scoring diagram/findings for  description). There is moderate hypokinesis of the left ventricular,  entire inferolateral wall.   2. The right ventricular size is moderately enlarged. There is severely  elevated pulmonary artery systolic pressure.   3. Left atrial size was moderately dilated.   4. Right atrial size was moderately dilated.   5. Mild to moderate mitral valve regurgitation. No evidence of mitral  stenosis.   6. Tricuspid valve regurgitation is moderate to severe.   7. The aortic valve is tricuspid. There is moderate calcification of the  aortic valve. There is moderate thickening of the aortic valve. Aortic  valve regurgitation is trivial. Aortic valve sclerosis/calcification is  present, without any evidence of  aortic stenosis.   8. The inferior vena cava is dilated in size with <50% respiratory  variability, suggesting right atrial pressure of 15 mmHg.   Conclusion(s)/Recommendation(s): No evidence of valvular vegetations on  this transthoracic echocardiogram. Consider a transesophageal  echocardiogram to exclude infective endocarditis if clinically indicated.   Recent Labs: 09/10/2022: B Natriuretic Peptide 3,533.9 10/19/2022: Magnesium 2.7 11/16/2022: ALT 15 11/17/2022: TSH 5.999 11/18/2022: Hemoglobin 9.3; Platelets 157 11/19/2022: BUN 15; Creatinine, Ser 2.10; Potassium 3.3; Sodium 136   Lipid Panel    Component Value Date/Time   CHOL 139 11/17/2022 0500   TRIG 114 11/17/2022 0500   HDL 55 11/17/2022 0500   CHOLHDL 2.5 11/17/2022 0500   VLDL 23 11/17/2022 0500   LDLCALC 61 11/17/2022 0500     Wt Readings from Last 3 Encounters:  11/18/22 42.8 kg  10/23/22 43.1 kg  10/13/22 50 kg     Assessment and Plan:   1. CAD without angina: Evidence of coronary  artery disease on chest CT in December 2020. No ischemia on stress test in March 2021. She has refused cardiac caths in  the past. She has demand ischemia when admitted with sepsis. No chest pain. Her probability of CAD is high. Continue Plavix and Lipitor.      2. HTN: BP is controlled.   3. Chronic diastolic CHF: Volume management by HD  Labs/ tests ordered today include:  No orders of the defined types were placed in this encounter.  Disposition:   F/U with me in 12 months   Signed, Lauree Chandler, MD 01/27/2023 5:52 PM    [facility] [address], [place], [place]  [address] Phone: [phone]; Fax: [phone]

## 2023-01-28 ENCOUNTER — Ambulatory Visit: Payer: Medicaid Other | Attending: Cardiovascular Disease | Admitting: Cardiovascular Disease

## 2023-04-01 ENCOUNTER — Ambulatory Visit: Payer: Medicare Other | Admitting: Internal Medicine
